# Patient Record
Sex: Male | Born: 1965
Health system: Southern US, Community
[De-identification: ages and names within clinical notes are randomized; demographics above are authoritative.]

## PROBLEM LIST (undated history)

## (undated) ENCOUNTER — Emergency Department (HOSPITAL_COMMUNITY): Discharge status: Against Medical Advice | Payer: Medicaid Other

## (undated) ENCOUNTER — Ambulatory Visit (HOSPITAL_COMMUNITY): Disposition: A | Discharge status: Home / Self Care | Payer: Medicaid Other

## (undated) DIAGNOSIS — I10 Essential (primary) hypertension: Secondary | ICD-10-CM

## (undated) DIAGNOSIS — J449 Chronic obstructive pulmonary disease, unspecified: Secondary | ICD-10-CM

## (undated) DIAGNOSIS — J45909 Unspecified asthma, uncomplicated: Secondary | ICD-10-CM

## (undated) DIAGNOSIS — Z5900 Homelessness unspecified: Secondary | ICD-10-CM

## (undated) DIAGNOSIS — I509 Heart failure, unspecified: Secondary | ICD-10-CM

## (undated) DIAGNOSIS — F259 Schizoaffective disorder, unspecified: Secondary | ICD-10-CM

## (undated) DIAGNOSIS — K219 Gastro-esophageal reflux disease without esophagitis: Secondary | ICD-10-CM

## (undated) DIAGNOSIS — E119 Type 2 diabetes mellitus without complications: Secondary | ICD-10-CM

## (undated) DIAGNOSIS — F329 Major depressive disorder, single episode, unspecified: Secondary | ICD-10-CM

## (undated) DIAGNOSIS — F32A Depression, unspecified: Secondary | ICD-10-CM

## (undated) DIAGNOSIS — F319 Bipolar disorder, unspecified: Secondary | ICD-10-CM

## (undated) DIAGNOSIS — R9431 Abnormal electrocardiogram [ECG] [EKG]: Secondary | ICD-10-CM

## (undated) DEATH — deceased

---

## 2011-12-01 ENCOUNTER — Encounter: Payer: Self-pay | Admitting: *Deleted

## 2011-12-01 ENCOUNTER — Emergency Department (HOSPITAL_COMMUNITY)
Admission: EM | Admit: 2011-12-01 | Discharge: 2011-12-12 | Disposition: A | Discharge status: Home / Self Care | Payer: Medicaid Other | Attending: Emergency Medicine | Admitting: Emergency Medicine

## 2011-12-01 DIAGNOSIS — I10 Essential (primary) hypertension: Secondary | ICD-10-CM | POA: Insufficient documentation

## 2011-12-01 DIAGNOSIS — R443 Hallucinations, unspecified: Secondary | ICD-10-CM | POA: Insufficient documentation

## 2011-12-01 DIAGNOSIS — F17200 Nicotine dependence, unspecified, uncomplicated: Secondary | ICD-10-CM | POA: Insufficient documentation

## 2011-12-01 DIAGNOSIS — R45851 Suicidal ideations: Secondary | ICD-10-CM

## 2011-12-01 HISTORY — DX: Essential (primary) hypertension: I10

## 2011-12-01 HISTORY — DX: Depression, unspecified: F32.A

## 2011-12-01 HISTORY — DX: Major depressive disorder, single episode, unspecified: F32.9

## 2011-12-01 LAB — COMPREHENSIVE METABOLIC PANEL
ALT: 31 U/L (ref 0–53)
AST: 49 U/L — ABNORMAL HIGH (ref 0–37)
Albumin: 4.7 g/dL (ref 3.5–5.2)
CO2: 25 mEq/L (ref 19–32)
Chloride: 96 mEq/L (ref 96–112)
GFR calc non Af Amer: >90 mL/min (ref >90)
Sodium: 137 mEq/L (ref 135–145)
Total Bilirubin: 0.5 mg/dL (ref 0.3–1.2)

## 2011-12-01 LAB — CBC
Platelets: 264 10*3/uL (ref 150–400)
RBC: 4.37 MIL/uL (ref 4.22–5.81)
WBC: 12.8 10*3/uL — ABNORMAL HIGH (ref 4.0–10.5)

## 2011-12-01 NOTE — ED Notes (Signed)
Pt c/o SI w/o plan, same complaint x 2 prior. Pt is presently voluntary. Pt presents w/ some evidence of distress, no eye contact, covering face. Pt denies HI and SI plans. Pt admits to auditory hallucinations.

## 2011-12-02 DIAGNOSIS — R45851 Suicidal ideations: Secondary | ICD-10-CM

## 2011-12-02 LAB — RAPID URINE DRUG SCREEN, HOSP PERFORMED
Amphetamines: NOT DETECTED
Barbiturates: NOT DETECTED
Cocaine: POSITIVE — AB
Tetrahydrocannabinol: NOT DETECTED

## 2011-12-02 MED ORDER — LORAZEPAM 1 MG PO TABS
1.0000 mg | ORAL_TABLET | Freq: Three times a day (TID) | ORAL | Status: DC | PRN
  Administered 2011-12-10: 1 mg via ORAL
  Filled 2011-12-02 (×2): qty 1

## 2011-12-02 MED ORDER — ONDANSETRON HCL 4 MG PO TABS
4.0000 mg | ORAL_TABLET | Freq: Three times a day (TID) | ORAL | Status: DC | PRN
  Filled 2011-12-02: qty 1

## 2011-12-02 MED ORDER — RISPERIDONE 1 MG PO TABS
1.0000 mg | ORAL_TABLET | Freq: Two times a day (BID) | ORAL | Status: DC
  Administered 2011-12-02 (×2): 1 mg via ORAL
  Filled 2011-12-02 (×2): qty 1

## 2011-12-02 MED ORDER — ALUM & MAG HYDROXIDE-SIMETH 200-200-20 MG/5ML PO SUSP: 30.0000 mL | ORAL | Status: DC | PRN

## 2011-12-02 MED ORDER — NICOTINE 21 MG/24HR TD PT24
21.0000 mg | MEDICATED_PATCH | Freq: Every day | TRANSDERMAL | Status: DC
  Filled 2011-12-02 (×2): qty 1

## 2011-12-02 MED ORDER — IBUPROFEN 600 MG PO TABS
600.0000 mg | ORAL_TABLET | Freq: Three times a day (TID) | ORAL | Status: DC | PRN
  Administered 2011-12-02 – 2011-12-10 (×2): 600 mg via ORAL
  Filled 2011-12-02 (×2): qty 1

## 2011-12-02 NOTE — BH Assessment (Signed)
Assessment Note   [name] is a 46 y.o. male who presents voluntarily at [facility] with SI but not plan. Pt's mood was very irritable and he refused to answer most questions. States he is "pissed as hell". Pt yelled at this clinician and said "I don't want to talk about it right now" several times. Most of info in this assessment is collateral info from PA-C and RN. Pt reports auditory hallucinations with several voices and "voices keep changing". Pt reports hx of depression which is currently untreated. States he is supposed to be on antipsychotic meds but doesn't know what he should be taking as his meds change frequently.   Pt states he had been in inpatient treatment at [facility] several times but was unsure of a specific dx. Pt reports he tried to overdose twice by ingesting pills.   Axis I: Psychotic Disorder NOS Axis II: Deferred Axis III:  Past Medical History  Diagnosis Date  . Hypertension   . Depression    Axis IV: Unknown Axis V: 21-30 behavior considerably influenced by delusions or hallucinations OR serious impairment in judgment, communication OR inability to function in almost all areas  Past Medical History:  Past Medical History  Diagnosis Date  . Hypertension   . Depression     History reviewed. No pertinent past surgical history.  Family History: History reviewed. No pertinent family history.  Social History:  reports that he has been smoking Cigarettes.  He has been smoking about 1 pack per day. He does not have any smokeless tobacco history on file. He reports that he drinks alcohol. He reports that he does not use illicit drugs.  Additional Social History:    Allergies: No Known Allergies  Home Medications:  Medications Prior to Admission  Medication Dose Route Frequency Provider Last Rate Last Dose  . alum & mag hydroxide-simeth (MAALOX/MYLANTA) 200-200-20 MG/5ML suspension 30 mL  30 mL Oral PRN Carolee Rota, PA      . ibuprofen (ADVIL,MOTRIN) tablet 600  mg  600 mg Oral Q8H PRN Carolee Rota, PA      . LORazepam (ATIVAN) tablet 1 mg  1 mg Oral Q8H PRN Carolee Rota, PA      . nicotine (NICODERM CQ - dosed in mg/24 hours) patch 21 mg  21 mg Transdermal Daily Carolee Rota, [place]      . ondansetron [facility]) tablet 4 mg  4 mg Oral Q8H PRN Carolee Rota, PA       No current outpatient prescriptions on file as of 12/01/2011.    OB/GYN Status:  No LMP for male patient.  General Assessment Data [facility] Living Arrangements:  [facility]) Can pt return to current living arrangement?: Yes Admission Status: Voluntary Transfer from: Home Referral Source: Self/Family/Friend     Risk to self Suicidal Ideation: Yes-Currently Present Suicidal Intent: Yes-Currently Present Is patient at risk for suicide?: Yes Depression: Yes Substance abuse history and/or treatment for substance abuse?: No  Risk to Others Homicidal Ideation: No Thoughts of Harm to Others: No Current Homicidal Intent: No  Psychosis Hallucinations: Auditory ("whole bunch of voices" )  Mental Status Report Appear/Hygiene: Disheveled;Poor hygiene Eye Contact: Poor Motor Activity: Freedom of movement Speech: Argumentative;Loud Level of Consciousness: Irritable Affect: Irritable;Angry     Prior Inpatient Therapy Prior Inpatient Therapy: Yes Prior Therapy Dates: has been to [facility] several times Prior Therapy Facilty/Provider(s): [facility] - wouldn't give dates  Values / Beliefs Cultural Requests During Hospitalization: None Spiritual Requests During Hospitalization: None        Additional Information Does patient have medical clearance?: Yes     Disposition:  Disposition Disposition of Patient: Inpatient treatment program Type of inpatient treatment program: Adult   On Site Evaluation by:   Reviewed with Physician:     Donnamarie Rossetti P 12/02/2011 4:56 AM

## 2011-12-02 NOTE — ED Notes (Signed)
Dr alhuwalia into see 

## 2011-12-02 NOTE — ED Provider Notes (Signed)
Medical screening examination/treatment/procedure(s) were performed by non-physician practitioner and as supervising physician I was immediately available for consultation/collaboration.   Danyla Wattley W Kemonie Cutillo, MD 12/02/11 0756 

## 2011-12-02 NOTE — ED Provider Notes (Signed)
History     CSN: [id number]  Arrival date & time 12/01/11  2200   First MD Initiated Contact with Patient 12/01/11 2309      Chief Complaint  Patient presents with  . Medical Clearance  . Suicidal    (Consider location/radiation/quality/duration/timing/severity/associated sxs/prior treatment) HPI Comments: Patient presents with complaint of suicidal ideation. He has a history of depression that is currently untreated. He is supposed to be on antipsychotic medications, but he says they change so frequently he doesn't know what he supposed to be taking. Patient denies any current chronic health problems or complaints. Patient admits to auditory hallucinations which are not new. Patient states he cannot make out what the voices are saying.  Patient is a 46 y.o. male presenting with mental health disorder. The history is provided by the patient.  Mental Health Problem The primary symptoms include hallucinations.  Additional symptoms of the illness do not include no headaches or no abdominal pain. He admits to suicidal ideas. He does not have a plan to commit suicide.    Past Medical History  Diagnosis Date  . Hypertension   . Depression     History reviewed. No pertinent past surgical history.  History reviewed. No pertinent family history.  History  Substance Use Topics  . Smoking status: Current Everyday Smoker -- 1.0 packs/day    Types: Cigarettes  . Smokeless tobacco: Not on file  . Alcohol Use: Yes     infrequently      Review of Systems  Constitutional: Negative for fever and chills.  HENT: Negative for sore throat and rhinorrhea.   Eyes: Negative for redness.  Respiratory: Negative for shortness of breath.   Cardiovascular: Negative for chest pain.  Gastrointestinal: Negative for nausea, vomiting, abdominal pain, diarrhea and constipation.  Genitourinary: Negative for dysuria.  Musculoskeletal: Negative for myalgias.  Skin: Negative for rash.  Neurological:  Negative for dizziness and headaches.  Psychiatric/Behavioral: Positive for hallucinations.    Allergies  Review of patient's allergies indicates no known allergies.  Home Medications  No current outpatient prescriptions on file.  BP 156/94  Pulse 90  Temp(Src) 98.7 F (37.1 C) (Oral)  Resp 20  SpO2 100%  Physical Exam  Nursing note and vitals reviewed. Constitutional: He is oriented to person, place, and time. He appears well-developed and well-nourished.  HENT:  Head: Normocephalic and atraumatic.  Eyes: Conjunctivae are normal. Pupils are equal, round, and reactive to light. Right eye exhibits no discharge. Left eye exhibits no discharge.  Neck: Normal range of motion. Neck supple.  Cardiovascular: Normal rate, regular rhythm and normal heart sounds.   Pulmonary/Chest: Effort normal and breath sounds normal.  Abdominal: Soft. Bowel sounds are normal. There is no tenderness. There is no rebound and no guarding.  Musculoskeletal: He exhibits no edema.  Neurological: He is alert and oriented to person, place, and time.  Skin: Skin is warm and dry.    ED Course  Procedures (including critical care time)  Labs Reviewed  CBC - Abnormal; Notable for the following:    WBC 12.8 (*)    RDW 15.7 (*)    All other components within normal limits  COMPREHENSIVE METABOLIC PANEL - Abnormal; Notable for the following:    Total Protein 8.7 (*)    AST 49 (*)    All other components within normal limits  ETHANOL - Abnormal; Notable for the following:    Alcohol, Ethyl (B) 28 (*)    All other components within normal limits  URINE  RAPID DRUG SCREEN (HOSP PERFORMED) - Abnormal; Notable for the following:    Cocaine POSITIVE (*)    All other components within normal limits   No results found.   1. Suicidal ideation     12:29 AM Patient was seen and examined. Workup is pending.  2:48 AM ACT to see.  MDM  SI        Carolee Rota, [place] 12/02/11 [phone]

## 2011-12-02 NOTE — ED Notes (Signed)
Patient being transferred to Psych ED rm# 41. Plan of care discussed with patient. Verbal report given to [place], Charity fundraiser.

## 2011-12-02 NOTE — ED Provider Notes (Signed)
Patient resting comfortably with no complaints.  Nelia Shi, MD 12/02/11 2228

## 2011-12-02 NOTE — ED Notes (Signed)
Care assumed, pt lying in bed sleeping

## 2011-12-02 NOTE — Consult Note (Signed)
Patient Identification:  [name] Date of Evaluation:  12/02/2011   History of Present Illness:  [name] is a 46 y.o. male who presents voluntarily at [facility] with SI but not plan. Pt's mood was very irritable and he refused to answer most questions. States he is "pissed as hell". Pt yelled at this clinician and said "I don't want to talk about it right now" several times. Most of info in this assessment is collateral info from PA-C and RN. Pt reports auditory hallucinations with several voices and "voices keep changing". Pt reports hx of depression which is currently untreated. States he is supposed to be on antipsychotic meds but doesn't know what he should be taking as his meds change frequently.  Pt states he had been in inpatient treatment at [facility] several times but was unsure of a specific dx. Pt reports he tried to overdose twice by ingesting pills.   Patient agrees to be started on Risperdal and he don't want to go to behavioral health but [facility]. Patient is logical and goal-directed during the interview.  Past Medical History:     Past Medical History  Diagnosis Date  . Hypertension   . Depression       History reviewed. No pertinent past surgical history.  Filed Vitals:   12/02/11 1417  BP:   Pulse:   Temp: 98.6 F (37 C)  Resp:     Lab Results:   BMET    Component Value Date/Time   NA 137 12/01/2011 2257   K 3.9 12/01/2011 2257   CL 96 12/01/2011 2257   CO2 25 12/01/2011 2257   GLUCOSE 82 12/01/2011 2257   BUN 14 12/01/2011 2257   CREATININE 0.83 12/01/2011 2257   CALCIUM 10.1 12/01/2011 2257   GFRNONAA >90 12/01/2011 2257   GFRAA >90 12/01/2011 2257    Allergies: No Known Allergies  Current Medications:  Prior to Admission medications   Not on File    Social History:    reports that he has been smoking Cigarettes.  He has been smoking about 1 pack per day. He does not have any smokeless tobacco history on file.  He reports that he drinks alcohol. He reports that he does not use illicit drugs.   Family History:    History reviewed. No pertinent family history.   DIAGNOSIS:   AXIS I  psychoses NOS   AXIS II  Deffered  AXIS III See medical notes.  AXIS IV  chronic mental health issues   AXIS V 40     Recommendations:  Patient need inpatient stabilization, started on Risperdal.   Eulogio Ditch, MD

## 2011-12-02 NOTE — ED Notes (Signed)
Awake and hungry - given a drink and boxed lunch an dreturns to sleep

## 2011-12-02 NOTE — ED Notes (Signed)
Patient brought to room 29 from triage.  Patient cooperative .  vacillating as to whether he wants to commit Sucide or hurt someone.  ACT team in to see the patient

## 2011-12-02 NOTE — ED Provider Notes (Signed)
Pt seen and evaluated in the psych ED.  He is having auditory hallucinations.  Has been assessed by ACT and is under review for placement at [facility].   Ethelda Chick, MD 12/02/11 [phone]

## 2011-12-02 NOTE — ED Notes (Signed)
Up to the bathroom 

## 2011-12-02 NOTE — ED Notes (Signed)
Pt c/o of being hot and sweating--room temp adjusted

## 2011-12-02 NOTE — BH Assessment (Signed)
[facility] referral needed, per Dr. Ahluwalia. On-coming staff made aware of patients disposition. Patient declined from in-pt admission to [facility], per Dr. Ahluwalia.  

## 2011-12-03 MED ORDER — RISPERIDONE 2 MG PO TABS
2.0000 mg | ORAL_TABLET | Freq: Two times a day (BID) | ORAL | Status: DC
  Filled 2011-12-03 (×6): qty 1

## 2011-12-03 NOTE — ED Provider Notes (Signed)
Vitals stable, sleeping currently.  Pt is psychotic, declined at [facility], awaiting possibly [facility] placement.    Gavin Pound. Oletta Lamas, MD 12/03/11 [phone]

## 2011-12-03 NOTE — ED Notes (Signed)
TC to [facility] and informed no beds per Beverly.  TC to [facility] and spoke with Robin, who stated they had no beds today.  TC to [facility] & informed no beds currently, but possibly after 1:00pm after discharges.  TC to [facility] & spoke with Michelle who stated they do have beds. TC to [facility] & spoke with Kathy who stated they have beds today.  

## 2011-12-03 NOTE — BH Assessment (Signed)
Assessment Note   [name] is an 46 y.o. male. This Clinical research associate met with pt to complete updated assessment. Pt reported to the [facility] voluntarily for SI with no plan and auditory hallucinations.Pt refused to answer the majority of assessment questions, becoming extremely agitated when asked updated assessment questions, stating " he doesn't need to answer the same questions everyday, nothing has changed." Pt added that the Risperdal does not work and that "he wants to go to [facility] not [facility]". Pt states that he "hears a whole bunch of voices all talking at the same time and cannot understand what is being said". Pt continues to endorse symptoms of depression and SI without a plan. Pt stated during the previous assessment that he has tried to OD twice, ingesting an unknown type of pill.   Per RN, pt has refused his medications this evening.  Pt has been accepted at [facility] pending a 400 hall bed, however pt is now requesting to transfer to [facility].  Axis I: Psychotic Disorder NOS Axis II: Deferred Axis III:  Past Medical History  Diagnosis Date  . Hypertension   . Depression    Axis IV: other psychosocial or environmental problems Axis V: 21-30 behavior considerably influenced by delusions or hallucinations OR serious impairment in judgment, communication OR inability to function in almost all areas  Past Medical History:  Past Medical History  Diagnosis Date  . Hypertension   . Depression     History reviewed. No pertinent past surgical history.  Family History: History reviewed. No pertinent family history.  Social History:  reports that he has been smoking Cigarettes.  He has been smoking about 1 pack per day. He does not have any smokeless tobacco history on file. He reports that he drinks alcohol. He reports that he does not use illicit drugs.  Additional Social History:    Allergies: No Known Allergies  Home Medications:  Medications Prior to Admission  Medication Dose Route Frequency  Provider Last Rate Last Dose  . alum & mag hydroxide-simeth (MAALOX/MYLANTA) 200-200-20 MG/5ML suspension 30 mL  30 mL Oral PRN Carolee Rota, PA      . ibuprofen (ADVIL,MOTRIN) tablet 600 mg  600 mg Oral Q8H PRN Carolee Rota, PA   600 mg at 12/02/11 1207  . LORazepam (ATIVAN) tablet 1 mg  1 mg Oral Q8H PRN Carolee Rota, PA      . nicotine (NICODERM CQ - dosed in mg/24 hours) patch 21 mg  21 mg Transdermal Daily Carolee Rota, PA      . ondansetron (ZOFRAN) tablet 4 mg  4 mg Oral Q8H PRN Carolee Rota, PA      . risperiDONE (RISPERDAL) tablet 2 mg  2 mg Oral BID Katheren Puller, MD      . DISCONTD: risperiDONE (RISPERDAL) tablet 1 mg  1 mg Oral BID Katheren Puller, MD   1 mg at 12/02/11 2130   No current outpatient prescriptions on file as of 12/01/2011.    OB/GYN Status:  No LMP for male patient.  General Assessment Data [facility] Living Arrangements: Other (Comment) ([facility]) Can pt return to current living arrangement?: Yes Admission Status: Voluntary Is patient capable of signing voluntary admission?: Yes Transfer from: Acute Hospital Referral Source: Self/Family/Friend  Education Status Is patient currently in school?: No  Risk to self Suicidal Ideation: Yes-Currently Present Suicidal Intent: Yes-Currently Present Is patient at risk for suicide?: Yes Suicidal Plan?: No-Not Currently/Within Last 6 Months  Recent stressful life event(s): Other (Comment) (persistent mental health diagnosis) Persecutory voices/beliefs?: No Depression: Yes Depression Symptoms: Feeling angry/irritable;Loss of interest in usual pleasures Substance abuse history and/or treatment for substance abuse?: No Suicide prevention information given to non-admitted patients: Not applicable  Risk to Others Homicidal Ideation: No Thoughts of Harm to Others: No Current Homicidal Intent: No Current Homicidal Plan: No  Psychosis Hallucinations: Auditory ("whole  bunch of voices at one time") Delusions: None noted  Mental Status Report Appear/Hygiene: Disheveled Eye Contact: Poor Motor Activity: Agitation Speech: Aggressive;Argumentative;Loud Level of Consciousness: Irritable Mood: Angry Affect: Angry;Irritable Anxiety Level: None Thought Processes: Coherent Judgement: Impaired Orientation: Person;Place;Time;Situation Obsessive Compulsive Thoughts/Behaviors: None  Cognitive Functioning Concentration: Decreased Memory: Recent Intact;Remote Intact IQ: Average Insight: Poor Impulse Control: Poor  Prior Inpatient Therapy Prior Inpatient Therapy: Yes Prior Therapy Dates: has been to [facility] several times Prior Therapy Facilty/Provider(s): [facility] - wouldn't give dates Reason for Treatment: AH               Values / Beliefs Cultural Requests During Hospitalization: None Spiritual Requests During Hospitalization: None        Additional Information 1:1 In Past 12 Months?: No CIRT Risk: Yes Elopement Risk: No Does patient have medical clearance?: Yes     Disposition:  Disposition Disposition of Patient: Inpatient treatment program Type of inpatient treatment program: Adult  On Site Evaluation by:   Reviewed with Physician:     N[place]ada Crane F 12/03/2011 10:45 PM

## 2011-12-03 NOTE — ED Notes (Signed)
SW in to speak with pt.

## 2011-12-03 NOTE — Consult Note (Signed)
  [name] is a 46 y.o. male [id number] 1966/03/21  Subjective/Objective:  Patient is still irritable and told me "everything is messed up" on repeatedly asking patient is not willing to share his issues. He still reported hearing voices, but patient does not seem to be internally preoccupied or delusional. He is redirectable during the interview. Patient is now aggreable to go to behavioral health for further stabilization.   Filed Vitals:   12/03/11 0613  BP: 109/81  Pulse: 66  Temp: 97.9 F (36.6 C)  Resp: 14    Lab Results:   BMET    Component Value Date/Time   NA 137 12/01/2011 2257   K 3.9 12/01/2011 2257   CL 96 12/01/2011 2257   CO2 25 12/01/2011 2257   GLUCOSE 82 12/01/2011 2257   BUN 14 12/01/2011 2257   CREATININE 0.83 12/01/2011 2257   CALCIUM 10.1 12/01/2011 2257   GFRNONAA >90 12/01/2011 2257   GFRAA >90 12/01/2011 2257    Medications:  Scheduled:     . nicotine  21 mg Transdermal Daily  . risperiDONE  1 mg Oral BID     PRN Meds alum & mag hydroxide-simeth, ibuprofen, LORazepam, ondansetron  Assessment/Plan:  Increased risperdal to 4 mg per day.  AHLUWALIA,SHAMSHER S MD 12/03/2011

## 2011-12-03 NOTE — ED Notes (Signed)
Care assumed, pt in room lying in bed watching television, affect brighter today than yesterday, less guarded, pleasant demeanor, states he took a shower today and that made him feel better. Continuing to monitor/assess behavior.

## 2011-12-03 NOTE — ED Notes (Addendum)
Writer in to administer HS risperdal 2mg  to pt, pt refused stating "I'm not taking that, the shit does not work", "I'm still hearing voices and the MD does not know what he is doing, I need something stronger like 1200 mg of seroquel which I am used to taking, that MD does not know he is not the one hearing voices". "I need a real MD, get a real MD in here to see me", pt got extremely agitated about the medication, writer advised pt that will place note in chart for MD and that he should also address his medication concerns to the MD when he does his rounds. Pt denied AH on writer's first assessment now he is c/o AH, does not disclose what the voices are saying.

## 2011-12-04 NOTE — ED Notes (Signed)
Care assumed, pt up OOB to use bathroom

## 2011-12-04 NOTE — ED Notes (Signed)
Spoke with Merlyn Albert at [facility] who reports who reports will call ACT team once patient has been reviewed.

## 2011-12-04 NOTE — ED Notes (Signed)
Belongings moved to locker 41 in [place]

## 2011-12-04 NOTE — ED Notes (Signed)
[facility] has no available bed for patient presently.  Patient is on the waiting list at [facility] and IVC is in place.

## 2011-12-04 NOTE — ED Notes (Signed)
Went into pt's room to take vitals. Patient stated "This is the last time you're gonna take my blood pressure if ya'll don't start giving me my blood pressure medicines." I asked if he took them on a daily basis and pt stated "you the nurse you know what I'm supposed to take." Explained to pt that I was the tech and that I was trying to understand the situation. I told pt what his bp was and asked if that was around normal for him on his bp meds. Pt stated "how do I know what's normal? I'm not the doctor." I explained I was only trying to help and get his baseline if he new what it was. As I was closing the door pt then called this tech a "dumbass". I explained to patient I was only here to help him then pt stated "if you gonna get an attitude then so am I". I told pt I was sorry he felt that way and pt stated "yeah me too now get out of my room."

## 2011-12-04 NOTE — ED Notes (Signed)
Pt continues to refuse HS medications

## 2011-12-04 NOTE — ED Notes (Signed)
Pt became agitated when I asked him about possibly taking blood pressure medicine.  Stated he took it while he was at [facility] but would not answer any other questions and stated "yall keep asking me the same fucking questions and I'm not answering anything else".  Stated his risperdal was not working and he was not going to take it.

## 2011-12-04 NOTE — ED Notes (Addendum)
Writer in room lying in bed guarded, angry affect, angry demeanor, writer attempting to engage pt in conversation, pt hesitant at first but as conversation continued, pt seemed to open up more, Primary school teacher what kind of report did she receive about him today, pt informed that Clinical research associate was told that he was agitated/refusing to answer questions/take medication. Pt states he has cursed out two people today and he is tired of people asking him the same questions over and over, he does not want to take the medication because it does not work, pt feels the MD/nurses here are not trying to help him/meet his needs and that when they come in his room they are coming in just to annoy him. Pt states he does not bother anybody and he is in his room minding his business. Pt states the only place he can get help is at [facility] and that is where he wants to go. Pt agitated during the conversation, says the MD/RN's/other staff are making him feel like he is stupid by asking him the same thing over/over, pt attempts to staff split, suspicious about certain staff, exhibiting paranoid behaviors and defensive at times, reports he is hearing voices however does not disclose what the voices are saying. States he will take his medications tonight and attempt to be more cooperative with the other staff. Continuing to monitor/assess behavior.

## 2011-12-04 NOTE — ED Notes (Signed)
Asked patient if he would like a shower. Patient stated "I took one yesterday." Asked patient if he would like one today and patient stated "I'll let you know later."

## 2011-12-05 MED ORDER — CLONIDINE HCL 0.1 MG PO TABS
0.1000 mg | ORAL_TABLET | Freq: Three times a day (TID) | ORAL | Status: DC
  Administered 2011-12-05 – 2011-12-11 (×17): 0.1 mg via ORAL
  Filled 2011-12-05 (×19): qty 1

## 2011-12-05 NOTE — ED Notes (Signed)
Patient refused vitals he said last night was the last time anyone will get vitals cause no one is treating his blood pressure he [facility] he will be treated right

## 2011-12-05 NOTE — ED Notes (Signed)
Pt requested to see charge nurse.  I asked him if there was anything that I could do for him and he stated "no". Jenaya notified.

## 2011-12-05 NOTE — ED Provider Notes (Addendum)
3:36 PM The patient is currently refusing all medications until he gets a dose of clonidine which he says he takes for blood pressure control.  After reviewing his vital signs, I find it safe and appropriate to order this medication and have done so.  Felisa Bonier, MD 12/05/11 1539  8:13 AM The patient was seen and evaluated by me this morning and is in no apparent distress, is awake and alert, and is aware of the current treatment plan.  The patient makes no requests for any interventions or treatments otherwise at this time.   Felisa Bonier, MD 12/11/11 [phone]
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
## 2011-12-05 NOTE — ED Provider Notes (Signed)
  Physical Exam  BP 154/98  Pulse 71  Temp(Src) 97.9 F (36.6 C) (Oral)  Resp 18  SpO2 99%  Physical Exam  ED Course  Procedures  MDM Resting comfortably, no complaints, vitals stable.     Glynn Octave, MD 12/05/11 [phone]

## 2011-12-05 NOTE — ED Notes (Signed)
Spoke with pt per request. Pt requesting sandwich and BP medication. BP medication ordered. Pt resting comfortably, refusing to cooperate with [facility] staff, coopereative with Consulting civil engineer.

## 2011-12-05 NOTE — ED Notes (Signed)
Patient refused to allow vital signs to be taken stating "It ain't doing no good. Don't worry about it. Everything will be taken care of."

## 2011-12-06 NOTE — Progress Notes (Signed)
Pt is on Wait List at [facility]) and will be transported once bed available.  Process usually takes 3 to 7 days so pt most likely will be in ED through weekend and Monday.  ACT will continue to follow with [facility].  Ms. [name] at [facility] verified pt is still on wait list.

## 2011-12-06 NOTE — ED Provider Notes (Signed)
Pt stable awaiting placement  [name] Lennert, MD 12/06/11 [phone]
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
## 2011-12-08 NOTE — BH Assessment (Signed)
Contacted [facility] and spoke to [place] to inquire about in-pt availability. She stated that they do not have any beds available at this time and to call back at 1pm regarding possible discharges at their facility.

## 2011-12-08 NOTE — BH Assessment (Signed)
Received a call from [place] at [facility] stating that patient is on the wait list.

## 2011-12-08 NOTE — ED Provider Notes (Signed)
Stable VS, resting comfortably. Awaiting placement  Loren Racer, MD 12/08/11 1105

## 2011-12-08 NOTE — BH Assessment (Signed)
Assessment Note   Contacted [facility] and spoke to [place]. She stated that their facility has planned discharges today. Writer faxed patients information to their facility @ [phone].

## 2011-12-09 NOTE — ED Provider Notes (Signed)
Patient resting on AM recheck.  He denies new complaints, and inquires about placement.  He was informed that we are awaiting placement.  [name] Munch, MD 12/09/11 1032

## 2011-12-10 NOTE — ED Notes (Signed)
Patient stated to me while I was sitting with another patient that he would "like to kill the bald headed guy in the glasses." Which he was speaking about his nurse Kathlene November.

## 2011-12-10 NOTE — ED Notes (Signed)
Offered patient shower. Refusing at this time.

## 2011-12-10 NOTE — ED Notes (Signed)
TC with Sheryl @ [facility]. Stated pt is still on wait list and is next to be called (pending discharges for bed openings). Will need legal documents sent. They will call when bed opens.

## 2011-12-10 NOTE — ED Notes (Signed)
Pt lying on his bed, hollering out, I am hungry, I want food". Ate his lunch tray earlier. Instructed to not shout out as it could disturb the other pts.

## 2011-12-11 NOTE — ED Notes (Signed)
Pt in room watching television, observed by staff smearing something on the camera so he could not be observed, Clinical research associate in room with security officer/[employer] requesting pt to clean camera, pt becomes agitated, loud, beligerant, and refuses to clean the camera while everyone is present, pt lying in bed with no shirt on continuously refuses to get up and clean camera.

## 2011-12-11 NOTE — ED Notes (Signed)
Patient up at nursing station asking for clean scrubs. When asked if he was ready to shower so this tech could clean his room and change linen patient stated "no, I'll let you know when I'm ready."

## 2011-12-11 NOTE — ED Notes (Signed)
Care assumed

## 2011-12-12 NOTE — ED Notes (Signed)
Tele psych consult completed, pt initially refused to speak with MD, stating he is not going to talk to MD a 2 am, pt informed this is the only time Md can speak with him, pt agitated, refusing to answer some of the questions, vague, guarded, back turned to camera, cursing at times, Tele Psych MD recommendation is to d/c pt.

## 2011-12-12 NOTE — ED Provider Notes (Signed)
4:03 AM Telepsychiatrist deems patient dischargable.   Hanley Seamen, MD 12/12/11 0403

## 2011-12-12 NOTE — ED Notes (Signed)
Pt up OOB to desk requesting to speak with Charge Nurse, Charge Nurse Victorino Dike B called states she will come speak with pt when she is available. Pt loud, beligerent, refusing to go inside room, standing inside room doorway, disruptive to milieu, cursing, disrespectful and refusing to cooperative.

## 2016-05-06 DIAGNOSIS — T391X1A Poisoning by 4-Aminophenol derivatives, accidental (unintentional), initial encounter: Secondary | ICD-10-CM | POA: Insufficient documentation

## 2016-05-08 DIAGNOSIS — T1491XA Suicide attempt, initial encounter: Secondary | ICD-10-CM | POA: Insufficient documentation

## 2016-05-29 ENCOUNTER — Encounter (HOSPITAL_COMMUNITY): Payer: Self-pay

## 2016-05-29 ENCOUNTER — Emergency Department (HOSPITAL_COMMUNITY)
Admission: EM | Admit: 2016-05-29 | Discharge: 2016-05-30 | Disposition: A | Discharge status: Other Acute Inpatient Hospital | Payer: Medicaid Other | Attending: Emergency Medicine | Admitting: Emergency Medicine

## 2016-05-29 DIAGNOSIS — I10 Essential (primary) hypertension: Secondary | ICD-10-CM | POA: Insufficient documentation

## 2016-05-29 DIAGNOSIS — R45851 Suicidal ideations: Secondary | ICD-10-CM | POA: Diagnosis present

## 2016-05-29 DIAGNOSIS — F17210 Nicotine dependence, cigarettes, uncomplicated: Secondary | ICD-10-CM | POA: Insufficient documentation

## 2016-05-29 DIAGNOSIS — J45909 Unspecified asthma, uncomplicated: Secondary | ICD-10-CM | POA: Diagnosis not present

## 2016-05-29 DIAGNOSIS — R440 Auditory hallucinations: Secondary | ICD-10-CM | POA: Insufficient documentation

## 2016-05-29 HISTORY — DX: Unspecified asthma, uncomplicated: J45.909

## 2016-05-29 LAB — COMPREHENSIVE METABOLIC PANEL
ALK PHOS: 62 U/L (ref 38–126)
ALT: 29 U/L (ref 17–63)
AST: 42 U/L — AB (ref 15–41)
Albumin: 4 g/dL (ref 3.5–5.0)
Anion gap: 8 (ref 5–15)
BUN: 16 mg/dL (ref 6–20)
CALCIUM: 9.3 mg/dL (ref 8.9–10.3)
CO2: 26 mmol/L (ref 22–32)
CREATININE: 1.17 mg/dL (ref 0.61–1.24)
Chloride: 107 mmol/L (ref 101–111)
Glucose, Bld: 147 mg/dL — ABNORMAL HIGH (ref 65–99)
Potassium: 4 mmol/L (ref 3.5–5.1)
SODIUM: 141 mmol/L (ref 135–145)
Total Bilirubin: 0.8 mg/dL (ref 0.3–1.2)
Total Protein: 7.2 g/dL (ref 6.5–8.1)

## 2016-05-29 LAB — RAPID URINE DRUG SCREEN, HOSP PERFORMED
AMPHETAMINES: NOT DETECTED
BARBITURATES: NOT DETECTED
Benzodiazepines: NOT DETECTED
Cocaine: POSITIVE — AB
OPIATES: NOT DETECTED
TETRAHYDROCANNABINOL: NOT DETECTED

## 2016-05-29 LAB — ACETAMINOPHEN LEVEL: Acetaminophen (Tylenol), Serum: <10 ug/mL — ABNORMAL LOW (ref 10–30)

## 2016-05-29 LAB — CBC
HCT: 42.1 % (ref 39.0–52.0)
HEMOGLOBIN: 14.3 g/dL (ref 13.0–17.0)
MCH: 33.1 pg (ref 26.0–34.0)
MCHC: 34 g/dL (ref 30.0–36.0)
MCV: 97.5 fL (ref 78.0–100.0)
Platelets: 236 10*3/uL (ref 150–400)
RBC: 4.32 MIL/uL (ref 4.22–5.81)
RDW: 13.8 % (ref 11.5–15.5)
WBC: 8.5 10*3/uL (ref 4.0–10.5)

## 2016-05-29 LAB — ETHANOL: Alcohol, Ethyl (B): 7 mg/dL — ABNORMAL HIGH (ref <5)

## 2016-05-29 LAB — SALICYLATE LEVEL

## 2016-05-29 MED ORDER — IBUPROFEN 800 MG PO TABS
800.0000 mg | ORAL_TABLET | Freq: Once | ORAL | Status: AC
  Administered 2016-05-29: 800 mg via ORAL
  Filled 2016-05-29: qty 1

## 2016-05-29 MED ORDER — HYDROCERIN EX CREA
TOPICAL_CREAM | Freq: Two times a day (BID) | CUTANEOUS | Status: DC
  Filled 2016-05-29: qty 113

## 2016-05-29 MED ORDER — ALBUTEROL SULFATE HFA 108 (90 BASE) MCG/ACT IN AERS
2.0000 | INHALATION_SPRAY | Freq: Once | RESPIRATORY_TRACT | Status: AC
  Administered 2016-05-29: 2 via RESPIRATORY_TRACT
  Filled 2016-05-29: qty 6.7

## 2016-05-29 NOTE — ED Notes (Signed)
With TTS 

## 2016-05-29 NOTE — ED Provider Notes (Signed)
CSN: [id number]     Arrival date & time 05/29/16  1314 History   First MD Initiated Contact with Patient 05/29/16 1614     Chief Complaint  Patient presents with  . Psychiatric Evaluation     (Consider location/radiation/quality/duration/timing/severity/associated sxs/prior Treatment) Patient is a 50 y.o. male presenting with mental health disorder. The history is provided by the patient.  Mental Health Problem Presenting symptoms: hallucinations, suicidal thoughts and suicidal threats   Presenting symptoms: no suicide attempt   Degree of incapacity (severity):  Moderate Onset quality:  Gradual Duration:  2 days Timing:  Constant Progression:  Worsening Chronicity:  Recurrent Context: drug abuse and noncompliance   Treatment compliance:  Untreated Time since last dose of psychoactive medication: "its been a while" Relieved by:  Nothing Worsened by:  Nothing tried Ineffective treatments:  None tried Associated symptoms: anxiety   Associated symptoms: no abdominal pain, no chest pain and no headaches    50 yo M With a chief complaint of suicidal ideation. Patient states he's been hearing voices and they've been telling him to jump in front of traffic. He recently try to kill himself at another facility that he would not name by overdosing on Tylenol. He states that they were not trying to help him there so he moved here to seek someone that could help him. He has not taken his antipsychotics in quite some time. He says this is because the voices told not to.  Past Medical History  Diagnosis Date  . Hypertension   . Depression   . Asthma    History reviewed. No pertinent past surgical history. No family history on file. Social History  Substance Use Topics  . Smoking status: Current Every Day Smoker -- 1.00 packs/day    Types: Cigarettes  . Smokeless tobacco: None  . Alcohol Use: Yes     Comment: infrequently    Review of Systems  Constitutional: Negative for fever and  chills.  HENT: Negative for congestion and facial swelling.   Eyes: Negative for discharge and visual disturbance.  Respiratory: Negative for shortness of breath.   Cardiovascular: Negative for chest pain and palpitations.  Gastrointestinal: Negative for vomiting, abdominal pain and diarrhea.  Musculoskeletal: Negative for myalgias and arthralgias.  Skin: Negative for color change and rash.  Neurological: Negative for tremors, syncope and headaches.  Psychiatric/Behavioral: Positive for suicidal ideas, hallucinations and dysphoric mood. Negative for confusion. The patient is nervous/anxious.       Allergies  Tylenol  Home Medications   Prior to Admission medications   Not on File   BP 126/84 mmHg  Pulse 99  Temp(Src) 98.2 F (36.8 C) (Oral)  Resp 22  Ht  (1.753 m)  Wt 180 lb 4 oz (81.761 kg)  BMI 26.61 kg/m2  SpO2 99% Physical Exam  Constitutional: He is oriented to person, place, and time. He appears well-developed and well-nourished.  HENT:  Head: Normocephalic and atraumatic.  Eyes: EOM are normal. Pupils are equal, round, and reactive to light.  Neck: Normal range of motion. Neck supple. No JVD present.  Cardiovascular: Normal rate and regular rhythm.  Exam reveals no gallop and no friction rub.   No murmur heard. Pulmonary/Chest: No respiratory distress. He has no wheezes.  Abdominal: He exhibits no distension. There is no rebound and no guarding.  Musculoskeletal: Normal range of motion.  Neurological: He is alert and oriented to person, place, and time.  Skin: No rash noted. No pallor.  Psychiatric: He has a normal  mood and affect. His behavior is normal.  Nursing note and vitals reviewed.   ED Course  Procedures (including critical care time) Labs Review Labs Reviewed  COMPREHENSIVE METABOLIC PANEL - Abnormal; Notable for the following:    Glucose, Bld 147 (*)    AST 42 (*)    All other components within normal limits  ETHANOL - Abnormal; Notable  for the following:    Alcohol, Ethyl (B) 7 (*)    All other components within normal limits  ACETAMINOPHEN LEVEL - Abnormal; Notable for the following:    Acetaminophen (Tylenol), Serum <10 (*)    All other components within normal limits  URINE RAPID DRUG SCREEN, HOSP PERFORMED - Abnormal; Notable for the following:    Cocaine POSITIVE (*)    All other components within normal limits  SALICYLATE LEVEL  CBC    Imaging Review No results found. I have personally reviewed and evaluated these images and lab results as part of my medical decision-making.   EKG Interpretation None      MDM   Final diagnoses:  Suicidal ideation    50 yo M with suicidal ideation. TTS evaluation.  Psych will admit.   The patients results and plan were reviewed and discussed.   Any x-rays performed were independently reviewed by myself.   Differential diagnosis were considered with the presenting HPI.  Medications  hydrocerin (EUCERIN) cream (not administered)  ibuprofen (ADVIL,MOTRIN) tablet 800 mg (800 mg Oral Given 05/29/16 1716)  albuterol (PROVENTIL HFA;VENTOLIN HFA) 108 (90 Base) MCG/ACT inhaler 2 puff (2 puffs Inhalation Given 05/29/16 1716)    Filed Vitals:   05/29/16 1322  BP: 126/84  Pulse: 99  Temp: 98.2 F (36.8 C)  TempSrc: Oral  Resp: 22  Height: 5\' 9"  (1.753 m)  Weight: 180 lb 4 oz (81.761 kg)  SpO2: 99%    Final diagnoses:  Suicidal ideation      Melene Plan, DO 05/29/16 2210
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
## 2016-05-29 NOTE — ED Notes (Signed)
Called for [employer].

## 2016-05-29 NOTE — ED Notes (Signed)
Snack given.

## 2016-05-29 NOTE — ED Notes (Signed)
Per PT, Pt reports having hx of psychiatric treatment and has not been complaint with medication. Pt reports hearing voices that are telling him to kill himself. No specific plan to harm himself noted.

## 2016-05-29 NOTE — ED Notes (Signed)
Meal tray ordered 

## 2016-05-30 ENCOUNTER — Inpatient Hospital Stay (HOSPITAL_COMMUNITY)
Admission: AD | Admit: 2016-05-30 | Discharge: 2016-06-05 | DRG: 885 | Disposition: A | Discharge status: Home / Self Care | Payer: Medicaid Other | Source: Intra-hospital | Attending: Psychiatry | Admitting: Psychiatry

## 2016-05-30 ENCOUNTER — Encounter (HOSPITAL_COMMUNITY): Payer: Self-pay

## 2016-05-30 DIAGNOSIS — G43909 Migraine, unspecified, not intractable, without status migrainosus: Secondary | ICD-10-CM | POA: Diagnosis present

## 2016-05-30 DIAGNOSIS — F419 Anxiety disorder, unspecified: Secondary | ICD-10-CM | POA: Diagnosis present

## 2016-05-30 DIAGNOSIS — B370 Candidal stomatitis: Secondary | ICD-10-CM | POA: Diagnosis present

## 2016-05-30 DIAGNOSIS — I10 Essential (primary) hypertension: Secondary | ICD-10-CM | POA: Diagnosis present

## 2016-05-30 DIAGNOSIS — F339 Major depressive disorder, recurrent, unspecified: Secondary | ICD-10-CM | POA: Diagnosis present

## 2016-05-30 DIAGNOSIS — J45909 Unspecified asthma, uncomplicated: Secondary | ICD-10-CM | POA: Diagnosis present

## 2016-05-30 DIAGNOSIS — F1420 Cocaine dependence, uncomplicated: Secondary | ICD-10-CM | POA: Diagnosis present

## 2016-05-30 DIAGNOSIS — F17210 Nicotine dependence, cigarettes, uncomplicated: Secondary | ICD-10-CM | POA: Diagnosis present

## 2016-05-30 DIAGNOSIS — R440 Auditory hallucinations: Secondary | ICD-10-CM | POA: Diagnosis not present

## 2016-05-30 DIAGNOSIS — F333 Major depressive disorder, recurrent, severe with psychotic symptoms: Secondary | ICD-10-CM | POA: Diagnosis present

## 2016-05-30 DIAGNOSIS — F250 Schizoaffective disorder, bipolar type: Secondary | ICD-10-CM | POA: Diagnosis not present

## 2016-05-30 DIAGNOSIS — F1010 Alcohol abuse, uncomplicated: Secondary | ICD-10-CM | POA: Diagnosis not present

## 2016-05-30 DIAGNOSIS — Z9114 Patient's other noncompliance with medication regimen: Secondary | ICD-10-CM | POA: Diagnosis not present

## 2016-05-30 DIAGNOSIS — R45851 Suicidal ideations: Secondary | ICD-10-CM | POA: Diagnosis not present

## 2016-05-30 DIAGNOSIS — G4700 Insomnia, unspecified: Secondary | ICD-10-CM | POA: Diagnosis present

## 2016-05-30 MED ORDER — IBUPROFEN 400 MG PO TABS
400.0000 mg | ORAL_TABLET | Freq: Four times a day (QID) | ORAL | Status: DC | PRN
  Administered 2016-05-30 – 2016-06-03 (×2): 400 mg via ORAL
  Filled 2016-05-30 (×2): qty 1

## 2016-05-30 MED ORDER — ALBUTEROL SULFATE HFA 108 (90 BASE) MCG/ACT IN AERS
2.0000 | INHALATION_SPRAY | RESPIRATORY_TRACT | Status: DC | PRN
  Administered 2016-05-30 – 2016-06-03 (×2): 2 via RESPIRATORY_TRACT
  Filled 2016-05-30 (×2): qty 6.7

## 2016-05-30 MED ORDER — LISINOPRIL 20 MG PO TABS
20.0000 mg | ORAL_TABLET | Freq: Every day | ORAL | Status: DC
  Administered 2016-05-30: 20 mg via ORAL
  Filled 2016-05-30: qty 1

## 2016-05-30 MED ORDER — AEROCHAMBER PLUS W/MASK MISC
1.0000 | Freq: Once | Status: AC
  Administered 2016-05-30: 1
  Filled 2016-05-30: qty 1

## 2016-05-30 MED ORDER — HYDROCERIN EX CREA
TOPICAL_CREAM | Freq: Two times a day (BID) | CUTANEOUS | Status: DC
  Administered 2016-06-01 – 2016-06-02 (×3): via TOPICAL
  Filled 2016-05-30 (×2): qty 113

## 2016-05-30 MED ORDER — ALUM & MAG HYDROXIDE-SIMETH 200-200-20 MG/5ML PO SUSP: 30.0000 mL | ORAL | Status: DC | PRN

## 2016-05-30 MED ORDER — TRAZODONE HCL 50 MG PO TABS: 50.0000 mg | ORAL_TABLET | Freq: Every evening | ORAL | Status: DC | PRN

## 2016-05-30 MED ORDER — MAGNESIUM HYDROXIDE 400 MG/5ML PO SUSP: 30.0000 mL | Freq: Every day | ORAL | Status: DC | PRN

## 2016-05-30 MED ORDER — ALBUTEROL SULFATE (2.5 MG/3ML) 0.083% IN NEBU: 2.0000 mL | INHALATION_SOLUTION | RESPIRATORY_TRACT | Status: DC | PRN

## 2016-05-30 MED ORDER — LISINOPRIL 20 MG PO TABS
20.0000 mg | ORAL_TABLET | Freq: Every day | ORAL | Status: DC
  Administered 2016-05-31 – 2016-06-05 (×6): 20 mg via ORAL
  Filled 2016-05-30 (×8): qty 1

## 2016-05-30 MED ORDER — ALBUTEROL SULFATE HFA 108 (90 BASE) MCG/ACT IN AERS
2.0000 | INHALATION_SPRAY | RESPIRATORY_TRACT | Status: DC | PRN
Start: 2016-05-30 — End: 2016-05-30

## 2016-05-30 MED ORDER — OLANZAPINE 5 MG PO TBDP: 5.0000 mg | ORAL_TABLET | Freq: Two times a day (BID) | ORAL | Status: DC | PRN

## 2016-05-30 NOTE — BHH Counselor (Signed)
Referral faxed to following as beds are available: [facility]  No beds available: [facility]  Evette Cristal, [place] Therapeutic Triage Specialist

## 2016-05-30 NOTE — ED Notes (Signed)
MD at bedside. 

## 2016-05-30 NOTE — ED Notes (Signed)
EMTALA checked by Irving Burton b, RN

## 2016-05-30 NOTE — Tx Team (Signed)
Initial Interdisciplinary Treatment Plan   PATIENT STRESSORS: Health problems Medication change or noncompliance Substance abuse   PATIENT STRENGTHS: General fund of knowledge   PROBLEM LIST: Problem List/Patient Goals Date to be addressed Date deferred Reason deferred Estimated date of resolution  Depression 05/30/16     Suicidal ideation 05/30/16     Psychosis 05/30/16     He refused to give a goal.  "I'm done talking" 05/30/16                                    DISCHARGE CRITERIA:  Improved stabilization in mood, thinking, and/or behavior Verbal commitment to aftercare and medication compliance  PRELIMINARY DISCHARGE PLAN: Outpatient therapy Medication management  PATIENT/FAMIILY INVOLVEMENT: This treatment plan has been presented to and reviewed with the patient, [name].  The patient and family have been given the opportunity to ask questions and make suggestions.  Norm Parcel [name] 05/30/2016, 4:18 PM

## 2016-05-30 NOTE — Progress Notes (Signed)
Psychoeducational Group Note  Date:  05/30/2016 Time:  2144  Group Topic/Focus:  Wrap-Up Group:   The focus of this group is to help patients review their daily goal of treatment and discuss progress on daily workbooks.  Participation Level: Did Not Attend  Participation Quality:  Not Applicable  Affect:  Not Applicable  Cognitive:  Not Applicable  Insight:  Not Applicable  Engagement in Group: Not Applicable  Additional Comments:  The patient did not attend group.   Hazle Coca S 05/30/2016, 9:44 PM

## 2016-05-30 NOTE — ED Notes (Signed)
[facility] called stating that patient has  A bed (503-2) at [facility] and [name] is the accepting physician.

## 2016-05-30 NOTE — ED Notes (Signed)
Belongings collected by night shift RN prior to this RN taking over care of patient. Belongings given to [facility] transport upon transport to [facility]

## 2016-05-30 NOTE — ED Notes (Signed)
[facility] here ready for transport.

## 2016-05-30 NOTE — ED Notes (Signed)
Regular breakfast tray ordered.  

## 2016-05-30 NOTE — Progress Notes (Signed)
[name] is a 50 y.o. male being admitted voluntarily to 76-[facility] from [facility].  He came in reporting symptoms of depression and suicidal ideation with a plan to jump off a building. Pt has a history of depression, and says he is hearing voices that tell him to harm himself, and sometimes they tell him to "slap" other people, but he has never acted on thoughts to harm others.  He has history of hurting his own arm by biting and bruising. He has been off his medication for "several months".  Past attempts include overdosing on Tylenol a month ago and being admitted to the hospital but will not say where. He states he "just moved here", and says he is staying with a friend. He is reporting symptoms including crying spells, social withdrawal, loss of interest in usual pleasures, decreased concentration, fatigue, irritability, decreased sleep, decreased appetite and feelings of hopelessness. PT denies homicidal ideation or history of violence. Pt denies auditory or visual hallucinations or other psychotic symptoms. Pt denies alcohol or substance abuse.  He is diagnosed with Substance abuse disorder, Major Depressive Disorder, chronic, severe with psychotic features.  He was very agitated during the assessment.  He refused to answer questions or sign paperwork.  "I am done with answering questions."  Admission paperwork completed and signed.  Belongings searched and secured in locker # 30.  Skin assessment completed and no skin issues noted.  Q 15 minute checks initiated for safety.  We will monitor the progress towards his goals.

## 2016-05-30 NOTE — BHH Counselor (Signed)
Writer notified pt's RN Leeroy Bock that pt has been accepted to [facility] bed [phone] and can be transported at any time now. Attending MD will be Eappen. # for report is [phone].  Evette Cristal, [place] Therapeutic Triage Specialist

## 2016-05-30 NOTE — ED Notes (Signed)
[place] transportation contacted for transport to [facility]

## 2016-05-30 NOTE — ED Provider Notes (Addendum)
Patient offers no complaint. He is requesting his daily medications, to include lisinopril 20 g daily and albuterol inhaler as needed for dyspnea. Ordered by me. Patient is alert awake and in no distress  Doug Sou, MD 05/30/16 0754 2:40 PM patient alert ambulatory pleasant and cooperative. Stable for transfer to [facility] accepting physician Dr.Eappen Results for orders placed or performed during the hospital encounter of 05/29/16  Comprehensive metabolic panel  Result Value Ref Range   Sodium 141 135 - 145 mmol/L   Potassium 4.0 3.5 - 5.1 mmol/L   Chloride 107 101 - 111 mmol/L   CO2 26 22 - 32 mmol/L   Glucose, Bld 147 (H) 65 - 99 mg/dL   BUN 16 6 - 20 mg/dL   Creatinine, Ser 0.26 0.61 - 1.24 mg/dL   Calcium 9.3 8.9 - 37.8 mg/dL   Total Protein 7.2 6.5 - 8.1 g/dL   Albumin 4.0 3.5 - 5.0 g/dL   AST 42 (H) 15 - 41 U/L   ALT 29 17 - 63 U/L   Alkaline Phosphatase 62 38 - 126 U/L   Total Bilirubin 0.8 0.3 - 1.2 mg/dL   GFR calc non Af Amer >60 >60 mL/min   GFR calc Af Amer >60 >60 mL/min   Anion gap 8 5 - 15  Ethanol  Result Value Ref Range   Alcohol, Ethyl (B) 7 (H) <5 mg/dL  Salicylate level  Result Value Ref Range   Salicylate Lvl <4.0 2.8 - 30.0 mg/dL  Acetaminophen level  Result Value Ref Range   Acetaminophen (Tylenol), Serum <10 (L) 10 - 30 ug/mL  cbc  Result Value Ref Range   WBC 8.5 4.0 - 10.5 K/uL   RBC 4.32 4.22 - 5.81 MIL/uL   Hemoglobin 14.3 13.0 - 17.0 g/dL   HCT 58.8 50.2 - 77.4 %   MCV 97.5 78.0 - 100.0 fL   MCH 33.1 26.0 - 34.0 pg   MCHC 34.0 30.0 - 36.0 g/dL   RDW 12.8 78.6 - 76.7 %   Platelets 236 150 - 400 K/uL  Rapid urine drug screen (hospital performed)  Result Value Ref Range   Opiates NONE DETECTED NONE DETECTED   Cocaine POSITIVE (A) NONE DETECTED   Benzodiazepines NONE DETECTED NONE DETECTED   Amphetamines NONE DETECTED NONE DETECTED   Tetrahydrocannabinol NONE DETECTED NONE DETECTED   Barbiturates NONE DETECTED NONE DETECTED   No results  found.   Doug Sou, MD 05/30/16 1446  Doug Sou, MD 05/30/16 1450

## 2016-05-31 ENCOUNTER — Encounter (HOSPITAL_COMMUNITY): Payer: Self-pay | Admitting: Psychiatry

## 2016-05-31 DIAGNOSIS — F250 Schizoaffective disorder, bipolar type: Secondary | ICD-10-CM | POA: Diagnosis present

## 2016-05-31 DIAGNOSIS — I10 Essential (primary) hypertension: Secondary | ICD-10-CM | POA: Diagnosis present

## 2016-05-31 DIAGNOSIS — R45851 Suicidal ideations: Secondary | ICD-10-CM

## 2016-05-31 DIAGNOSIS — F1010 Alcohol abuse, uncomplicated: Secondary | ICD-10-CM | POA: Diagnosis present

## 2016-05-31 DIAGNOSIS — F1420 Cocaine dependence, uncomplicated: Secondary | ICD-10-CM

## 2016-05-31 MED ORDER — MAGIC MOUTHWASH
5.0000 mL | Freq: Four times a day (QID) | ORAL | Status: DC
  Administered 2016-05-31 – 2016-06-04 (×9): 5 mL via ORAL
  Filled 2016-05-31 (×30): qty 5

## 2016-05-31 MED ORDER — OLANZAPINE 10 MG IM SOLR: 5.0000 mg | Freq: Three times a day (TID) | INTRAMUSCULAR | Status: DC | PRN

## 2016-05-31 MED ORDER — LORAZEPAM 2 MG/ML IJ SOLN: 1.0000 mg | Freq: Four times a day (QID) | INTRAMUSCULAR | Status: DC | PRN

## 2016-05-31 MED ORDER — ARIPIPRAZOLE 5 MG PO TABS
5.0000 mg | ORAL_TABLET | Freq: Every day | ORAL | Status: DC
  Administered 2016-05-31: 5 mg via ORAL
  Filled 2016-05-31 (×2): qty 1

## 2016-05-31 MED ORDER — LORAZEPAM 1 MG PO TABS
1.0000 mg | ORAL_TABLET | Freq: Four times a day (QID) | ORAL | Status: DC | PRN
  Filled 2016-05-31: qty 1

## 2016-05-31 MED ORDER — LAMOTRIGINE 25 MG PO TABS
25.0000 mg | ORAL_TABLET | Freq: Every evening | ORAL | Status: DC
  Administered 2016-06-01 – 2016-06-03 (×3): 25 mg via ORAL
  Filled 2016-05-31 (×6): qty 1

## 2016-05-31 MED ORDER — OLANZAPINE 5 MG PO TBDP: 5.0000 mg | ORAL_TABLET | Freq: Three times a day (TID) | ORAL | Status: DC | PRN

## 2016-05-31 NOTE — BHH Suicide Risk Assessment (Signed)
[facility] Admission Suicide Risk Assessment   Nursing information obtained from:  Patient Demographic factors:  Male, Low socioeconomic status Current Mental Status:  Suicidal ideation indicated by patient Loss Factors:  NA (Pt refused to answer questions) Historical Factors:  Prior suicide attempts Risk Reduction Factors:  NA (Pt refused to answer questions)  Total Time spent with patient: 30 minutes Principal Problem: Schizoaffective disorder, bipolar type (HCC) Diagnosis:   Patient Active Problem List   Diagnosis Date Noted  . Schizoaffective disorder, bipolar type (HCC) [F25.0] 05/31/2016  . Cocaine use disorder, severe, dependence (HCC) [F14.20] 05/31/2016  . Alcohol use disorder, mild, abuse [F10.10] 05/31/2016   Subjective Data: Please see H&P.   Continued Clinical Symptoms:  Alcohol Use Disorder Identification Test Final Score (AUDIT): 0 The "Alcohol Use Disorders Identification Test", Guidelines for Use in Primary Care, Second Edition.  World Science writer [facility]). Score between 0-7:  no or low risk or alcohol related problems. Score between 8-15:  moderate risk of alcohol related problems. Score between 16-19:  high risk of alcohol related problems. Score 20 or above:  warrants further diagnostic evaluation for alcohol dependence and treatment.   CLINICAL FACTORS:   Alcohol/Substance Abuse/Dependencies Previous Psychiatric Diagnoses and Treatments   Musculoskeletal: Strength & Muscle Tone: within normal limits Gait & Station: normal Patient leans: N/A  Psychiatric Specialty Exam: Physical Exam  Nursing note and vitals reviewed.   Review of Systems  Psychiatric/Behavioral: Positive for depression, suicidal ideas, hallucinations and substance abuse. The patient is nervous/anxious.   All other systems reviewed and are negative.   Blood pressure 171/96, pulse 57, temperature 97.7 F (36.5 C), temperature source Oral, resp. rate 18, height 5\' 9"  (1.753 m), weight  81.647 kg (180 lb), SpO2 100 %.Body mass index is 26.57 kg/(m^2).                      Please see H&P.                                     COGNITIVE FEATURES THAT CONTRIBUTE TO RISK:  Closed-mindedness, Polarized thinking and Thought constriction (tunnel vision)    SUICIDE RISK:   Severe:  Frequent, intense, and enduring suicidal ideation, specific plan, no subjective intent, but some objective markers of intent (i.e., choice of lethal method), the method is accessible, some limited preparatory behavior, evidence of impaired self-control, severe dysphoria/symptomatology, multiple risk factors present, and few if any protective factors, particularly a lack of social support.  PLAN OF CARE: Please see H&P.   I certify that inpatient services furnished can reasonably be expected to improve the patient's condition.   Khadir Roam, MD 05/31/2016, 9:15 AM

## 2016-05-31 NOTE — Progress Notes (Signed)
D: Pt endorsed severe depression and severe anxiety; was observed pacing the hall for several mins at a time. Pt denies AVH; Pt was however observed talking to self while pacing the hall. Pt may also be paranoid; unhappy that roommate came in and out of room several times. Pt could be hostile and easily agitated; got angry when nursed asked if he need something for anxiety, "why do you all keep asking me that; I would let you know if I need something; okay?" Pt denies SI, HI and pain. A: PRN medications offered as prescribed.  Support, encouragement, and safe environment provided.  15-minute safety checks continue. R: Pt was refused all PRNs offered.  Pt did not attend group. Safety checks continue.

## 2016-05-31 NOTE — BHH Group Notes (Signed)
BHH Group Notes:  (Nursing/MHT/Case Management/Adjunct)  Date:  05/31/2016  Time:  11:04 AM  Type of Therapy:  Nurse Education  Participation Level:  Did Not Attend   Mickie Bail 05/31/2016, 11:04 AM

## 2016-05-31 NOTE — Progress Notes (Signed)
[name] has been  walking out in the 500 hall most of the day. He is anxious / agitated. He talks / mumbles / constantly..sometimes his conversation is understandable and other times it is not. A He refused to complete his daily assessment. HE refused to allow an  EKG to be performed on him. He refused to take lamictal but has taken his  Lisinopril and magic mouthwash. R Safety in place. Staff to cont to attempt to gain patient's trust.

## 2016-05-31 NOTE — Progress Notes (Signed)
[name] is seen OOB UAL on the 500 hall today tolerated fair. HE is somewhat disorganized and agitated as he walks around the unit this morning. A He refused to complete his daily assessment when this nurse asked him to answer the questions walked down the hall...mutterting to himself. R Safety in place. MD ordered prn magic mouthwash ( for pt c/o mouth sores and irritation) and pt started on po lamictal and abilify Safety in place.

## 2016-05-31 NOTE — H&P (Signed)
Psychiatric Admission Assessment Adult  Patient Identification: [name] MRN:  [id number] Date of Evaluation:  05/31/2016 Chief Complaint:  Patient states " I need help .'     Principal Diagnosis: Schizoaffective disorder, bipolar type ([place]) Diagnosis:   Patient Active Problem List   Diagnosis Date Noted  . Schizoaffective disorder, bipolar type ([place]) [F25.0] 05/31/2016  . Cocaine use disorder, severe, dependence ([place]) [F14.20] 05/31/2016  . Alcohol use disorder, mild, abuse [F10.10] 05/31/2016  . Essential hypertension [I10] 05/31/2016   History of Present Illness: [name] is a 50 y.o. AA male , who is single , on SSD, who presented reporting symptoms of depression and suicidal ideation with a plan to jump off a building.   Per initial notes in EHR " Pt has a history of depression, and says he is hearing voices that tell him to harm himself, and sometimes they tell him to "slap" other people, but he has never acted on thoughts to harm others. He has hurt his own arm by biting and bruising. Pt reports that he has been off his medication for "several months". Past attempts include overdosing on Tylenol a month ago and being admitted to the hospital (pt refuses to say where). He states he "just moved here", and says he is staying with a friend, but won't say where he came from, and his chart says he was seen here in 2012.Pt acknowledges symptoms including crying spells, social withdrawal, loss of interest in usual pleasures, decreased concentration, fatigue, irritability, decreased sleep, decreased appetite and feelings of hopelessness. PT denies homicidal ideation or history of violence. Pt denies auditory or visual hallucinations or other psychotic symptoms. Pt denies alcohol or substance abuse.Pt denies current stressors. Pt admits to history of abuse and trauma, but does not disclose much. Pt has fair insight and judgement. Pt's OP history includes some treatment, but pt  refuses to disclose any information about past treatment.Pt is casually dressed, alert, oriented x4 with normal speech and normal motor behavior. Eye contact is good. Pt's mood is irritable/depressed and affect is depressed. Affect is congruent with mood. Thought process is coherent and relevant. There is no indication Pt is currently responding to internal stimuli or experiencing delusional thought content. Pt was cooperative throughout assessment. Pt is currently unable to contract for safety outside the hospital and wants inpatient psychiatric treatment."    Patient seen and chart reviewed TODAY .Discussed patient with treatment team. Pt appeared as very angry and irritable , with mood lability . Pt was seen as getting mad and loud several times during the evaluation. Pt required a lot of redirection. Pt reported several stressors , not being on the right medications , having a drug addiction and getting help with it . Pt reports he has tried seroquel in the past which made him very groggy. Pt reports AH - asking him to kill self. Pt confirmed above hx - see above - has depressive sx as well as has SI with plan. Pt reports he abuses cocaine daily - several grams - unable to give exact quantity - has been abusing it for a very long time. Pt also abuses alcohol on a daily basis- but reports he does not get drunk. Pt reports several hospitalizations , more than he is able to count and has had several suicide attempts , mostly by OD . His recent OD was a month ago , on several tylenol pills and required emergency care.  Associated Signs/Symptoms: Depression Symptoms:  depressed mood, anhedonia, insomnia, psychomotor agitation,  psychomotor retardation, fatigue, feelings of worthlessness/guilt, difficulty concentrating, hopelessness, suicidal thoughts with specific plan, suicidal attempt, anxiety, (Hypo) Manic Symptoms:  Distractibility, Hallucinations, Impulsivity, Irritable Mood, Labiality  of Mood, Anxiety Symptoms:  Excessive Worry, Psychotic Symptoms:  Hallucinations: Auditory Command:  see above PTSD Symptoms: Negative Total Time spent with patient: 1 hour  Past Psychiatric History:Reports hx of bipolar, schizophrenia and cocaine abuse. Pt reports several hospitalizations , more than he is able to count and has had several suicide attempts . Pt reports he is noncompliant on medications.  Is the patient at risk to self? Yes.    Has the patient been a risk to self in the past 6 months? Yes.    Has the patient been a risk to self within the distant past? Yes.    Is the patient a risk to others? Yes.    Has the patient been a risk to others in the past 6 months? Yes.    Has the patient been a risk to others within the distant past? No.   Prior Inpatient Therapy:  see above Prior Outpatient Therapy:  see above  Alcohol Screening: 1. How often do you have a drink containing alcohol?: Never 9. Have you or someone else been injured as a result of your drinking?: No 10. Has a relative or friend or a doctor or another health worker been concerned about your drinking or suggested you cut down?: No Alcohol Use Disorder Identification Test Final Score (AUDIT): 0 Brief Intervention: AUDIT score less than 7 or less-screening does not suggest unhealthy drinking-brief intervention not indicated (Pt stated, "don't you worry about it") Substance Abuse History in the last 12 months:  Yes.  see above- cocaine, alcohol Consequences of Substance Abuse: Medical Consequences:  several admissions in the past Previous Psychotropic Medications: Yes - risperidone , seroquel ( groggy) Psychological Evaluations: No  Past Medical History:  Past Medical History  Diagnosis Date  . Hypertension   . Depression   . Asthma    History reviewed. No pertinent past surgical history. Family History:  Family History  Problem Relation Age of Onset  . Drug abuse Mother   . Suicidality Mother    Family  Psychiatric  History: denies Tobacco Screening: 0.5 ppd - offered nicotine patch Social History: pt is single, homeless , has SSD, reports hx of legal issues, none pending. History  Alcohol Use  . Yes    Comment: infrequently     History  Drug Use  . Yes  . Special: Cocaine    Additional Social History:      Pain Medications: denies Prescriptions: denies Over the Counter: denies History of alcohol / drug use?: Yes Longest period of sobriety (when/how long): 6 1/2 years in 90's-2002 Negative Consequences of Use: Financial Withdrawal Symptoms: Other (Comment) (He refused to answer questions) Name of Substance 1: alcohol--liquor and beer 1 - Age of First Use: doesn't remember 1 - Amount (size/oz): variable 1 - Frequency: every few days 1 - Duration: years 1 - Last Use / Amount: wouldn't answer for admission nurse Name of Substance 2: cocaine (per [facility] assessment) 2 - Age of First Use: Unknown (He refused to answer questions) 2 - Amount (size/oz): variable 2 - Frequency: "every now and then" 2 - Duration: years 2 - Last Use / Amount: unknown (He refused to answer questions)                Allergies:   Allergies  Allergen Reactions  . Tylenol [Acetaminophen] Nausea And Vomiting  Lab Results:  Results for orders placed or performed during the hospital encounter of 05/29/16 (from the past 48 hour(s))  Rapid urine drug screen (hospital performed)     Status: Abnormal   Collection Time: 05/29/16  1:32 PM  Result Value Ref Range   Opiates NONE DETECTED NONE DETECTED   Cocaine POSITIVE (A) NONE DETECTED   Benzodiazepines NONE DETECTED NONE DETECTED   Amphetamines NONE DETECTED NONE DETECTED   Tetrahydrocannabinol NONE DETECTED NONE DETECTED   Barbiturates NONE DETECTED NONE DETECTED    Comment:        DRUG SCREEN FOR MEDICAL PURPOSES ONLY.  IF CONFIRMATION IS NEEDED FOR ANY PURPOSE, NOTIFY LAB WITHIN 5 DAYS.        LOWEST DETECTABLE LIMITS FOR URINE DRUG  SCREEN Drug Class       Cutoff (ng/mL) Amphetamine      1000 Barbiturate      200 Benzodiazepine   329 Tricyclics       924 Opiates          300 Cocaine          300 THC              50   Comprehensive metabolic panel     Status: Abnormal   Collection Time: 05/29/16  1:35 PM  Result Value Ref Range   Sodium 141 135 - 145 mmol/L   Potassium 4.0 3.5 - 5.1 mmol/L   Chloride 107 101 - 111 mmol/L   CO2 26 22 - 32 mmol/L   Glucose, Bld 147 (H) 65 - 99 mg/dL   BUN 16 6 - 20 mg/dL   Creatinine, Ser 1.17 0.61 - 1.24 mg/dL   Calcium 9.3 8.9 - 10.3 mg/dL   Total Protein 7.2 6.5 - 8.1 g/dL   Albumin 4.0 3.5 - 5.0 g/dL   AST 42 (H) 15 - 41 U/L   ALT 29 17 - 63 U/L   Alkaline Phosphatase 62 38 - 126 U/L   Total Bilirubin 0.8 0.3 - 1.2 mg/dL   GFR calc non Af Amer >60 >60 mL/min   GFR calc Af Amer >60 >60 mL/min    Comment: (NOTE) The eGFR has been calculated using the CKD EPI equation. This calculation has not been validated in all clinical situations. eGFR's persistently <60 mL/min signify possible Chronic Kidney Disease.    Anion gap 8 5 - 15  Ethanol     Status: Abnormal   Collection Time: 05/29/16  1:35 PM  Result Value Ref Range   Alcohol, Ethyl (B) 7 (H) <5 mg/dL    Comment:        LOWEST DETECTABLE LIMIT FOR SERUM ALCOHOL IS 5 mg/dL FOR MEDICAL PURPOSES ONLY   Salicylate level     Status: None   Collection Time: 05/29/16  1:35 PM  Result Value Ref Range   Salicylate Lvl <2.6 2.8 - 30.0 mg/dL  Acetaminophen level     Status: Abnormal   Collection Time: 05/29/16  1:35 PM  Result Value Ref Range   Acetaminophen (Tylenol), Serum <10 (L) 10 - 30 ug/mL    Comment:        THERAPEUTIC CONCENTRATIONS VARY SIGNIFICANTLY. A RANGE OF 10-30 ug/mL MAY BE AN EFFECTIVE CONCENTRATION FOR MANY PATIENTS. HOWEVER, SOME ARE BEST TREATED AT CONCENTRATIONS OUTSIDE THIS RANGE. ACETAMINOPHEN CONCENTRATIONS >150 ug/mL AT 4 HOURS AFTER INGESTION AND >50 ug/mL AT 12 HOURS AFTER INGESTION  ARE OFTEN ASSOCIATED WITH TOXIC REACTIONS.   cbc     Status:  None   Collection Time: 05/29/16  1:35 PM  Result Value Ref Range   WBC 8.5 4.0 - 10.5 K/uL   RBC 4.32 4.22 - 5.81 MIL/uL   Hemoglobin 14.3 13.0 - 17.0 g/dL   HCT 42.1 39.0 - 52.0 %   MCV 97.5 78.0 - 100.0 fL   MCH 33.1 26.0 - 34.0 pg   MCHC 34.0 30.0 - 36.0 g/dL   RDW 13.8 11.5 - 15.5 %   Platelets 236 150 - 400 K/uL    Blood Alcohol level:  Lab Results  Component Value Date   ETH 7* 05/29/2016   ETH 28* 19/14/7829    Metabolic Disorder Labs:  No results found for: HGBA1C, MPG No results found for: PROLACTIN No results found for: CHOL, TRIG, HDL, CHOLHDL, VLDL, LDLCALC  Current Medications: Current Facility-Administered Medications  Medication Dose Route Frequency Provider Last Rate Last Dose  . albuterol (PROVENTIL HFA;VENTOLIN HFA) 108 (90 Base) MCG/ACT inhaler 2 puff  2 puff Inhalation Q4H PRN Ursula Alert, MD   2 puff at 05/30/16 2002  . alum & mag hydroxide-simeth (MAALOX/MYLANTA) 200-200-20 MG/5ML suspension 30 mL  30 mL Oral Q4H PRN Niel Hummer, NP      . ARIPiprazole (ABILIFY) tablet 5 mg  5 mg Oral QHS Ursula Alert, MD      . hydrocerin (EUCERIN) cream   Topical BID Niel Hummer, NP      . ibuprofen (ADVIL,MOTRIN) tablet 400 mg  400 mg Oral Q6H PRN Niel Hummer, NP   400 mg at 05/30/16 2000  . lamoTRIgine (LAMICTAL) tablet 25 mg  25 mg Oral QPM Siara Gorder, MD      . lisinopril (PRINIVIL,ZESTRIL) tablet 20 mg  20 mg Oral Daily Niel Hummer, NP   20 mg at 05/31/16 0820  . LORazepam (ATIVAN) tablet 1 mg  1 mg Oral Q6H PRN Ursula Alert, MD       Or  . LORazepam (ATIVAN) injection 1 mg  1 mg Intramuscular Q6H PRN Ursula Alert, MD      . magic mouthwash  5 mL Oral QID Anner Baity, MD      . magnesium hydroxide (MILK OF MAGNESIA) suspension 30 mL  30 mL Oral Daily PRN Niel Hummer, NP      . OLANZapine zydis (ZYPREXA) disintegrating tablet 5 mg  5 mg Oral TID PRN Ursula Alert, MD        Or  . OLANZapine (ZYPREXA) injection 5 mg  5 mg Intramuscular TID PRN Ursula Alert, MD      . traZODone (DESYREL) tablet 50 mg  50 mg Oral QHS PRN Niel Hummer, NP       PTA Medications: No prescriptions prior to admission    Musculoskeletal: Strength & Muscle Tone: within normal limits Gait & Station: normal Patient leans: N/A  Psychiatric Specialty Exam: Physical Exam  Nursing note and vitals reviewed. Constitutional:  I concur with PE done in ED.    Review of Systems  Psychiatric/Behavioral: Positive for depression, suicidal ideas, hallucinations and substance abuse. The patient is nervous/anxious.   All other systems reviewed and are negative.   Blood pressure 171/96, pulse 57, temperature 97.7 F (36.5 C), temperature source Oral, resp. rate 18, height '5\' 9"'  (1.753 m), weight 81.647 kg (180 lb), SpO2 100 %.Body mass index is 26.57 kg/(m^2).  General Appearance: Guarded  Eye Contact:  Fair  Speech:  Normal Rate  Volume:  Increased  Mood:  Angry, Anxious and Irritable  Affect:  Labile  Thought Process:  Goal Directed and Descriptions of Associations: Circumstantial  Orientation:  Full (Time, Place, and Person)  Thought Content:  Hallucinations: Auditory Command:  kill self and Paranoid Ideation  Suicidal Thoughts:  Yes.  with intent/plan  Homicidal Thoughts:  No is irritable   Memory:  Immediate;   Fair Recent;   Fair Remote;   Fair  Judgement:  Impaired  Insight:  Shallow  Psychomotor Activity:  Restlessness  Concentration:  Concentration: Fair and Attention Span: Fair  Recall:  [employer] of Knowledge:  Fair  Language:  Fair  Akathisia:  No  Handed:  Right  AIMS (if indicated):     Assets:  Desire for Improvement  ADL's:  Intact  Cognition:  WNL  Sleep:  Number of Hours: 5.75       Treatment Plan Summary: [name] is a 50 y.o. AA male , who is single , on SSD, who presented reporting symptoms of depression and suicidal ideation with a plan to  jump off a building. Pt today is seen as irritable, angry , pacing the unit , being loud . Will start treatment - observe on the unit.  Daily contact with patient to assess and evaluate symptoms and progress in treatment and Medication management Patient will benefit from inpatient treatment and stabilization.  Estimated length of stay is 5-7 days.  Reviewed past medical records,treatment plan.  Will start Abilify 5 mg po qhs for psychosis/mood sx. Plan to DC pt on Abilify Maintena IM 400 mg - if he tolerates the po. Will add Lamictal 25 mg po daily for mood sx. Will restart home medications. Will add Magic mouth wash for oral sore. Will add PRN medications as per agitation protocol. Will continue to monitor vitals ,medication compliance and treatment side effects while patient is here.  Will monitor for medical issues as well as call consult as needed.  Reviewed labs cbc - wnl, bAL- 7 , uds - cocaine pos, CMP - AST elevated likely due to substance abuse ,will order EKG , tsh, lipid panel, hba1c, pl. CSW will start working on disposition.  Patient to participate in therapeutic milieu .        Observation Level/Precautions:  15 minute checks    Psychotherapy:  Individual and group therapy     Consultations:  Social worker  Discharge Concerns:stability and safety         I certify that inpatient services furnished can reasonably be expected to improve the patient's condition.    Kashina Mecum, MD 7/1/201711:02 AM

## 2016-05-31 NOTE — [facility] Group Notes (Signed)
[facility] Group Notes:  (Nursing/MHT/Case Management/Adjunct)  Date:  05/31/2016  Time:  11:08 AM  Type of Therapy:  Nurse Education  Participation Level:  Did Not Attend   Samamtha Tiegs O Iwenekha 05/31/2016, 11:08 AM 

## 2016-05-31 NOTE — Progress Notes (Signed)
Patient ID: [name], male   DOB: 02-02-1966, 50 y.o.   MRN: [id number] D: client visible on the unit, announces to writer "things started to pick up a little bit" "I always have depression" client pleasant with initial introduction, but can easily get agitated, after receiving medication and going to bed client begins to complain that staff was coming in to do safetery checks sooner than fifteen minutes. Client gets irritated gets up arguing, loudly, doesn't want staff to come in for checks. Paces the hall, requests to speak to charge nurse.  A: Administrator, arts. Olin Pia Charge RN spoke with client. Client eventually went back to bed after pace for about an hour. Staff will maintain safety checks q50min as explained by charge nurse, it is protocol. R: Client is safe on unit.

## 2016-05-31 NOTE — BH Assessment (Signed)
CSW attempted assessment with patient. Patient was initially resistant but once it was clarified that he was meeting with the social worker and that another Child psychotherapist will follow up with him on Monday, he was willing to continue. Patient answered 2 questions from the assessment, as this clinician began to ask additional questions, "Patient stated, you don't need to ask me about that. I'm not talking about that." Clinician attempted to explain the assessment and the information therein, patient stated, "I only need you to ask about 1 thing and I'm not answering anything else." This clinician attempted to clarify with patient those areas and the information the assessment was requesting and patient continued over-talking the clinician in order to prevent response. Patient then left the room refusing to complete the assessment.   Beverly Sessions MSW, [name]

## 2016-05-31 NOTE — [facility] Group Notes (Signed)
[facility] [name] Group Therapy  05/31/2016  11:20 - 11:55 AM  Type of Therapy:  Group Therapy  Participation Level:  Did Not Attend; although both MHT and CSW encouraged pt to do so and group was announced over speaker system.    Harrill, Catherine Campbell 

## 2016-06-01 MED ORDER — ARIPIPRAZOLE 15 MG PO TABS
7.5000 mg | ORAL_TABLET | Freq: Every day | ORAL | Status: DC
  Administered 2016-06-01 – 2016-06-02 (×2): 7.5 mg via ORAL
  Filled 2016-06-01 (×4): qty 1

## 2016-06-01 NOTE — Progress Notes (Signed)
[facility] MD Progress Note  06/01/2016 9:34 AM [name]  MRN:  [id number] Subjective: Patient states " I am a bit better.'  Objective:[name] is a 50 y.o. AA male , who is single , on SSD, who presented reporting symptoms of depression and suicidal ideation with a plan to jump off a building.  Patient seen and chart reviewed.Discussed patient with treatment team.  Patient today continues to have on and off mood sx as well as anger issues. He seems to be pressured , restless often and continues to require redirection on the unit. Pt denies any ADRs of medications. Will continue to encourage and support.     Principal Problem: Schizoaffective disorder, bipolar type (HCC) Diagnosis:   Patient Active Problem List   Diagnosis Date Noted  . Schizoaffective disorder, bipolar type (HCC) [F25.0] 05/31/2016  . Cocaine use disorder, severe, dependence (HCC) [F14.20] 05/31/2016  . Alcohol use disorder, mild, abuse [F10.10] 05/31/2016  . Essential hypertension [I10] 05/31/2016   Total Time spent with patient: 25 minutes  Past Psychiatric History: Please see H&P.   Past Medical History:  Past Medical History  Diagnosis Date  . Hypertension   . Depression   . Asthma    History reviewed. No pertinent past surgical history. Family History:  Family History  Problem Relation Age of Onset  . Drug abuse Mother   . Suicidality Mother    Family Psychiatric  History: Please see H&P.  Social History: Please see H&P.  History  Alcohol Use  . Yes    Comment: infrequently     History  Drug Use  . Yes  . Special: Cocaine    Social History   Social History  . Marital Status: Single    Spouse Name: N/A  . Number of Children: N/A  . Years of Education: N/A   Social History Main Topics  . Smoking status: Current Every Day Smoker -- 1.00 packs/day    Types: Cigarettes  . Smokeless tobacco: None  . Alcohol Use: Yes     Comment: infrequently  . Drug Use: Yes    Special:  Cocaine  . Sexual Activity: Not Asked   Other Topics Concern  . None   Social History Narrative   Additional Social History:    Pain Medications: denies Prescriptions: denies Over the Counter: denies History of alcohol / drug use?: Yes Longest period of sobriety (when/how long): 6 1/2 years in 90's-2002 Negative Consequences of Use: Financial Withdrawal Symptoms: Other (Comment) (He refused to answer questions) Name of Substance 1: alcohol--liquor and beer 1 - Age of First Use: doesn't remember 1 - Amount (size/oz): variable 1 - Frequency: every few days 1 - Duration: years 1 - Last Use / Amount: wouldn't answer for admission nurse Name of Substance 2: cocaine (per [employer] assessment) 2 - Age of First Use: Unknown (He refused to answer questions) 2 - Amount (size/oz): variable 2 - Frequency: "every now and then" 2 - Duration: years 2 - Last Use / Amount: unknown (He refused to answer questions)                Sleep: Poor  Appetite:  Fair  Current Medications: Current Facility-Administered Medications  Medication Dose Route Frequency Provider Last Rate Last Dose  . albuterol (PROVENTIL HFA;VENTOLIN HFA) 108 (90 Base) MCG/ACT inhaler 2 puff  2 puff Inhalation Q4H PRN Jomarie Longs, MD   2 puff at 05/30/16 2002  . alum & mag hydroxide-simeth (MAALOX/MYLANTA) 200-200-20 MG/5ML suspension 30 mL  30 mL  Oral Q4H PRN Thermon Leyland, NP      . ARIPiprazole (ABILIFY) tablet 5 mg  5 mg Oral QHS Jomarie Longs, MD   5 mg at 05/31/16 2124  . hydrocerin (EUCERIN) cream   Topical BID Thermon Leyland, NP      . ibuprofen (ADVIL,MOTRIN) tablet 400 mg  400 mg Oral Q6H PRN Thermon Leyland, NP   400 mg at 05/30/16 2000  . lamoTRIgine (LAMICTAL) tablet 25 mg  25 mg Oral QPM Jomarie Longs, MD   25 mg at 05/31/16 1712  . lisinopril (PRINIVIL,ZESTRIL) tablet 20 mg  20 mg Oral Daily Thermon Leyland, NP   20 mg at 06/01/16 0800  . LORazepam (ATIVAN) tablet 1 mg  1 mg Oral Q6H PRN Jomarie Longs, MD        Or  . LORazepam (ATIVAN) injection 1 mg  1 mg Intramuscular Q6H PRN Jomarie Longs, MD      . magic mouthwash  5 mL Oral QID Jomarie Longs, MD   5 mL at 06/01/16 0803  . magnesium hydroxide (MILK OF MAGNESIA) suspension 30 mL  30 mL Oral Daily PRN Thermon Leyland, NP      . OLANZapine zydis (ZYPREXA) disintegrating tablet 5 mg  5 mg Oral TID PRN Jomarie Longs, MD       Or  . OLANZapine (ZYPREXA) injection 5 mg  5 mg Intramuscular TID PRN Jomarie Longs, MD      . traZODone (DESYREL) tablet 50 mg  50 mg Oral QHS PRN Thermon Leyland, NP        Lab Results: No results found for this or any previous visit (from the past 48 hour(s)).  Blood Alcohol level:  Lab Results  Component Value Date   ETH 7* 05/29/2016   ETH 28* 12/01/2011    Metabolic Disorder Labs: No results found for: HGBA1C, MPG No results found for: PROLACTIN No results found for: CHOL, TRIG, HDL, CHOLHDL, VLDL, LDLCALC  Physical Findings: AIMS: Facial and Oral Movements Muscles of Facial Expression: None, normal Lips and Perioral Area: None, normal Jaw: None, normal Tongue: None, normal,Extremity Movements Upper (arms, wrists, hands, fingers): None, normal Lower (legs, knees, ankles, toes): None, normal, Trunk Movements Neck, shoulders, hips: None, normal, Overall Severity Severity of abnormal movements (highest score from questions above): None, normal Incapacitation due to abnormal movements: None, normal Patient's awareness of abnormal movements (rate only patient's report): No Awareness, Dental Status Current problems with teeth and/or dentures?: No Does patient usually wear dentures?: No  CIWA:  CIWA-Ar Total: 0 COWS:  COWS Total Score: 0  Musculoskeletal: Strength & Muscle Tone: within normal limits Gait & Station: normal Patient leans: N/A  Psychiatric Specialty Exam: Physical Exam  Nursing note and vitals reviewed.   Review of Systems  Psychiatric/Behavioral: Positive for depression, suicidal  ideas, hallucinations and substance abuse. The patient is nervous/anxious.   All other systems reviewed and are negative.   Blood pressure 166/100, pulse 57, temperature 97.8 F (36.6 C), temperature source Oral, resp. rate 18, height 5\' 9"  (1.753 m), weight 81.647 kg (180 lb), SpO2 100 %.Body mass index is 26.57 kg/(m^2).  General Appearance: Fairly Groomed  Eye Contact:  Fair  Speech:  Pressured  Volume:  Increased  Mood:  Angry, Anxious and Irritable improving  Affect:  Labile  Thought Process:  Irrelevant and Descriptions of Associations: Circumstantial  Orientation:  Full (Time, Place, and Person)  Thought Content:  Delusions, Hallucinations: Auditory Command:  asking him to kill  self, Paranoid Ideation and Rumination  Suicidal Thoughts:  Yes.  with intent/plan able to contract for safety on the unit  Homicidal Thoughts:  No  Memory:  Immediate;   Fair Recent;   Fair Remote;   Fair  Judgement:  Impaired  Insight:  Shallow  Psychomotor Activity:  Increased and Restlessness  Concentration:  Concentration: Fair and Attention Span: Fair  Recall:  [employer] of Knowledge:  Fair  Language:  Fair  Akathisia:  No  Handed:  Right  AIMS (if indicated):     Assets:  Desire for Improvement  ADL's:  Intact  Cognition:  WNL  Sleep:  Number of Hours: 4.5     Treatment Plan Summary:[name] is a 50 y.o. AA male , who is single , on SSD, who presented reporting symptoms of depression and suicidal ideation with a plan to jump off a building. Pt with mood lability/irritability, continued SI with hx of multiple suicide attempts, will need inpatient stay. Daily contact with patient to assess and evaluate symptoms and progress in treatment and Medication management   Will increase Abilify to 7.5 mg po qhs for psychosis/mood sx. Plan to DC pt on Abilify Maintena IM 400 mg - if he tolerates the po. Will continue Lamictal 25 mg po daily for mood sx. Restarted home medications. Will  continue Magic mouth wash for oral sore. Will continue PRN medications as per agitation protocol. Will continue to monitor vitals ,medication compliance and treatment side effects while patient is here.  Will monitor for medical issues as well as call consult as needed.  Reviewed labs- pending  tsh, lipid panel, hba1c, pl- will reorder for tomorrow.EKG shows qt prolonged , but qtc - wnl. Will monitor. CSW will continue  working on disposition.  Patient to participate in therapeutic milieu .   [name] Asa, MD 06/01/2016, 9:34 AM

## 2016-06-01 NOTE — [facility] Group Notes (Signed)
[facility] [name] Group Therapy  06/01/2016 11:15 AM  Type of Therapy:  Group Therapy  Participation Level:  Active  Participation Quality:  Monopolizing  Affect:  Blunted  Cognitive:  Alert  Insight:  Limited  Engagement in Therapy:  Limited  Modes of Intervention:  Redirection, Socialization and Support  Summary of Progress/Problems: The main focus of today's process group was to identify the patient's current support system and decide on other supports that can be put in place. An emphasis was placed on using counselor, doctor, therapy groups, 12-step groups, and problem-specific support groups to expand supports. There was also a discussion about what constitutes a healthy support versus an unhealthy support. The patient came in late and left group early. Patient appeared content to monopolize conversation yet when facilitator moved on to others he muttered and left the room.    Carney Bern, [name]

## 2016-06-01 NOTE — Progress Notes (Signed)
Patient ID: [name], male   DOB: [birth year], 50 y.o.   MRN: [id number]    D: Pt has been appropriate on the unit today. Pt attempted to attend groups and engage in treatment. Pt took all medications without any problems. Pt reported that his depression was 8, his hopelessness was a 10, and his anxiety was a 5. Pt reported that he continues to be positive for SI, however was able to contract for safety. Pt reported being negative HI, pt reported that he was positive AH/VH. Pt reported that his goal for today was to have a good day. A: 15 min checks continued for patient safety. R: Pt safety maintained.

## 2016-06-01 NOTE — Progress Notes (Signed)
Adult Psychoeducational Group Note  Date:  06/01/2016 Time:  8:15 PM  Group Topic/Focus:  Wrap-Up Group:   The focus of this group is to help patients review their daily goal of treatment and discuss progress on daily workbooks.  Participation Level:  Did Not Attend  Pt did not attend wrap-up group.    [name] 06/01/2016, 8:23 PM

## 2016-06-02 LAB — GC/CHLAMYDIA PROBE AMP (~~LOC~~) NOT AT ARMC
CHLAMYDIA, DNA PROBE: NEGATIVE
NEISSERIA GONORRHEA: NEGATIVE

## 2016-06-02 LAB — LIPID PANEL
CHOLESTEROL: 194 mg/dL (ref 0–200)
HDL: 103 mg/dL (ref >40)
LDL Cholesterol: 81 mg/dL (ref 0–99)
TRIGLYCERIDES: 52 mg/dL (ref <150)
Total CHOL/HDL Ratio: 1.9 RATIO
VLDL: 10 mg/dL (ref 0–40)

## 2016-06-02 LAB — HIV ANTIBODY (ROUTINE TESTING W REFLEX): HIV SCREEN 4TH GENERATION: NONREACTIVE

## 2016-06-02 MED ORDER — FLUTICASONE PROPIONATE 50 MCG/ACT NA SUSP
1.0000 | Freq: Every day | NASAL | Status: DC
  Administered 2016-06-02 – 2016-06-03 (×2): 1 via NASAL
  Filled 2016-06-02 (×2): qty 16

## 2016-06-02 MED ORDER — NAPROXEN 500 MG PO TABS
500.0000 mg | ORAL_TABLET | Freq: Two times a day (BID) | ORAL | Status: DC
  Administered 2016-06-02 – 2016-06-05 (×6): 500 mg via ORAL
  Filled 2016-06-02 (×12): qty 1

## 2016-06-02 NOTE — Progress Notes (Signed)
D.  Pt irritable on approach, asks "why do you wake me up for medications and not for snacks?"  Pt did not go to evening wrap up group.  Pt would not answer questions, states he would come up later for his medications.  A.  Pt given snacks by MHTs at 2124, states "there is no problem now".  Pt refused magic mouthwash, but did take Abilify.  Support and encouragement offered  R.  Pt remains safe on the unit, will continue to monitor.

## 2016-06-02 NOTE — Progress Notes (Signed)
DAR NOTE: Patient presents with anxious mood and affect. Reports pain, auditory and visual hallucinations.  Rates depression at 0, hopelessness at 0, and anxiety at .  Maintained on routine safety checks.  Medications given as prescribed.  Support and encouragement offered as needed.  Attended group and participated.  Patient preoccupied with finding placement at the [facility].  Patient observed socializing with peers in the dayroom.
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
## 2016-06-02 NOTE — Progress Notes (Deleted)
1:1 Note: Patient maintained on constant supervision for safety.  Patient remained in his this evening.  Patient refused 5 pm oral medication.  Zyprexa 10 mg IM injection given.  Routine safety checks continues.  Patient in bed with eyes closed.  No safety concern reported or noted.

## 2016-06-02 NOTE — Progress Notes (Signed)
Adult Psychoeducational Group Note  Date:  06/02/2016 Time:  8:15 PM  Group Topic/Focus:  Wrap-Up Group:   The focus of this group is to help patients review their daily goal of treatment and discuss progress on daily workbooks.  Participation Level:  Did Not Attend  Pt did not attend wrap-up group. Pt declined invitation.    [name] 06/02/2016, 8:26 PM

## 2016-06-02 NOTE — Progress Notes (Signed)
[address] [facility] MD Progress Note  06/02/2016 10:36 AM [name]  MRN:  [id number] Subjective: Patient states that he is not good today.  "I felt really good yesterday and today my mind is all over the place."  Objective:[name] is a 50 y.o. AA male , who is single , on SSD, who presented reporting symptoms of depression and suicidal ideation with a plan to jump off a building.  Discussed patient with nursing and CSW. Patient seen and chart reviewed today.  Patient expressed frustration and wanted to get meds changed.  Discussed that meds may not work immediately.  Patient stated good undersatnding.  Will continue to monitor.   Tolerating medications. He states that "it was awhile" since he last saw a mental health provider and he is unable to recall last time he was on meds and what they were.   Will continue to encourage and support.   Principal Problem: Schizoaffective disorder, bipolar type (HCC) Diagnosis:   Patient Active Problem List   Diagnosis Date Noted  . Schizoaffective disorder, bipolar type (HCC) [F25.0] 05/31/2016  . Cocaine use disorder, severe, dependence (HCC) [F14.20] 05/31/2016  . Alcohol use disorder, mild, abuse [F10.10] 05/31/2016  . Essential hypertension [I10] 05/31/2016   Total Time spent with patient: 25 minutes  Past Psychiatric History: Please see H&P.   Past Medical History:  Past Medical History  Diagnosis Date  . Hypertension   . Depression   . Asthma    History reviewed. No pertinent past surgical history. Family History:  Family History  Problem Relation Age of Onset  . Drug abuse Mother   . Suicidality Mother    Family Psychiatric  History: Please see H&P.  Social History: Please see H&P.  History  Alcohol Use  . Yes    Comment: infrequently     History  Drug Use  . Yes  . Special: Cocaine    Social History   Social History  . Marital Status: Single    Spouse Name: N/A  . Number of Children: N/A  . Years of Education: N/A    Social History Main Topics  . Smoking status: Current Every Day Smoker -- 1.00 packs/day    Types: Cigarettes  . Smokeless tobacco: None  . Alcohol Use: Yes     Comment: infrequently  . Drug Use: Yes    Special: Cocaine  . Sexual Activity: Not Asked   Other Topics Concern  . None   Social History Narrative   Additional Social History:    Pain Medications: denies Prescriptions: denies Over the Counter: denies History of alcohol / drug use?: Yes Longest period of sobriety (when/how long): 6 1/2 years in 90's-2002 Negative Consequences of Use: Financial Withdrawal Symptoms: Other (Comment) (He refused to answer questions) Name of Substance 1: alcohol--liquor and beer 1 - Age of First Use: doesn't remember 1 - Amount (size/oz): variable 1 - Frequency: every few days 1 - Duration: years 1 - Last Use / Amount: wouldn't answer for admission nurse Name of Substance 2: cocaine (per [facility] assessment) 2 - Age of First Use: Unknown (He refused to answer questions) 2 - Amount (size/oz): variable 2 - Frequency: "every now and then" 2 - Duration: years 2 - Last Use / Amount: unknown (He refused to answer questions)                Sleep: Poor  Appetite:  Fair  Current Medications: Current Facility-Administered Medications  Medication Dose Route Frequency Provider Last Rate Last Dose  .  albuterol (PROVENTIL HFA;VENTOLIN HFA) 108 (90 Base) MCG/ACT inhaler 2 puff  2 puff Inhalation Q4H PRN Jomarie Longs, MD   2 puff at 05/30/16 2002  . alum & mag hydroxide-simeth (MAALOX/MYLANTA) 200-200-20 MG/5ML suspension 30 mL  30 mL Oral Q4H PRN Thermon Leyland, NP      . ARIPiprazole (ABILIFY) tablet 7.5 mg  7.5 mg Oral QHS Saramma Eappen, MD   7.5 mg at 06/01/16 2114  . hydrocerin (EUCERIN) cream   Topical BID Thermon Leyland, NP      . ibuprofen (ADVIL,MOTRIN) tablet 400 mg  400 mg Oral Q6H PRN Thermon Leyland, NP   400 mg at 05/30/16 2000  . lamoTRIgine (LAMICTAL) tablet 25 mg  25 mg Oral  QPM Jomarie Longs, MD   25 mg at 06/01/16 1638  . lisinopril (PRINIVIL,ZESTRIL) tablet 20 mg  20 mg Oral Daily Thermon Leyland, NP   20 mg at 06/02/16 0810  . LORazepam (ATIVAN) tablet 1 mg  1 mg Oral Q6H PRN Jomarie Longs, MD       Or  . LORazepam (ATIVAN) injection 1 mg  1 mg Intramuscular Q6H PRN Jomarie Longs, MD      . magic mouthwash  5 mL Oral QID Jomarie Longs, MD   5 mL at 06/02/16 1014  . magnesium hydroxide (MILK OF MAGNESIA) suspension 30 mL  30 mL Oral Daily PRN Thermon Leyland, NP      . naproxen (NAPROSYN) tablet 500 mg  500 mg Oral BID WC Adonis Brook, NP      . OLANZapine zydis (ZYPREXA) disintegrating tablet 5 mg  5 mg Oral TID PRN Jomarie Longs, MD       Or  . OLANZapine (ZYPREXA) injection 5 mg  5 mg Intramuscular TID PRN Jomarie Longs, MD      . traZODone (DESYREL) tablet 50 mg  50 mg Oral QHS PRN Thermon Leyland, NP        Lab Results:  Results for orders placed or performed during the hospital encounter of 05/30/16 (from the past 48 hour(s))  Lipid panel     Status: None   Collection Time: 06/02/16  6:10 AM  Result Value Ref Range   Cholesterol 194 0 - 200 mg/dL   Triglycerides 52 <161 mg/dL   HDL 096 >04 mg/dL   Total CHOL/HDL Ratio 1.9 RATIO   VLDL 10 0 - 40 mg/dL   LDL Cholesterol 81 0 - 99 mg/dL    Comment:        Total Cholesterol/HDL:CHD Risk Coronary Heart Disease Risk Table                     Men   Women  1/2 Average Risk   3.4   3.3  Average Risk       5.0   4.4  2 X Average Risk   9.6   7.1  3 X Average Risk  23.4   11.0        Use the calculated Patient Ratio above and the CHD Risk Table to determine the patient's CHD Risk.        ATP III CLASSIFICATION (LDL):  <100     mg/dL   Optimal  540-981  mg/dL   Near or Above                    Optimal  130-159  mg/dL   Borderline  191-478  mg/dL   High  >295  mg/dL   Very High Performed at [facility]     Blood Alcohol level:  Lab Results  Component Value Date   [facility] 7*  05/29/2016   ETH 28* 12/01/2011    Metabolic Disorder Labs: No results found for: HGBA1C, MPG No results found for: PROLACTIN Lab Results  Component Value Date   CHOL 194 06/02/2016   TRIG 52 06/02/2016   HDL 103 06/02/2016   CHOLHDL 1.9 06/02/2016   VLDL 10 06/02/2016   LDLCALC 81 06/02/2016    Physical Findings: AIMS: Facial and Oral Movements Muscles of Facial Expression: None, normal Lips and Perioral Area: None, normal Jaw: None, normal Tongue: None, normal,Extremity Movements Upper (arms, wrists, hands, fingers): None, normal Lower (legs, knees, ankles, toes): None, normal, Trunk Movements Neck, shoulders, hips: None, normal, Overall Severity Severity of abnormal movements (highest score from questions above): None, normal Incapacitation due to abnormal movements: None, normal Patient's awareness of abnormal movements (rate only patient's report): No Awareness, Dental Status Current problems with teeth and/or dentures?: No Does patient usually wear dentures?: No  CIWA:  CIWA-Ar Total: 0 COWS:  COWS Total Score: 0  Musculoskeletal: Strength & Muscle Tone: within normal limits Gait & Station: normal Patient leans: N/A  Psychiatric Specialty Exam: Physical Exam  Nursing note and vitals reviewed.   Review of Systems  Psychiatric/Behavioral: Positive for depression, suicidal ideas, hallucinations and substance abuse. The patient is nervous/anxious.   All other systems reviewed and are negative.   Blood pressure 169/100, pulse 62, temperature 98.4 F (36.9 C), temperature source Oral, resp. rate 12, height 5\' 9"  (1.753 m), weight 81.647 kg (180 lb), SpO2 100 %.Body mass index is 26.57 kg/(m^2).  General Appearance: Fairly Groomed  Eye Contact:  Fair  Speech:  Pressured  Volume:  Increased  Mood:  Angry, Anxious and Irritable improving  Affect:  Labile  Thought Process:  Irrelevant and Descriptions of Associations: Circumstantial  Orientation:  Full (Time,  Place, and Person)  Thought Content:  Delusions, Hallucinations: Auditory Command:  asking him to kill self, Paranoid Ideation and Rumination  Suicidal Thoughts:  Yes.  with intent/plan able to contract for safety on the unit  Homicidal Thoughts:  No  Memory:  Immediate;   Fair Recent;   Fair Remote;   Fair  Judgement:  Impaired  Insight:  Shallow  Psychomotor Activity:  Increased and Restlessness  Concentration:  Concentration: Fair and Attention Span: Fair  Recall:  [employer] of Knowledge:  Fair  Language:  Fair  Akathisia:  No  Handed:  Right  AIMS (if indicated):     Assets:  Desire for Improvement  ADL's:  Intact  Cognition:  WNL  Sleep:  Number of Hours: 6.5    Treatment Plan Summary:Jeffre Guia is a 50 y.o. AA male , who is single , on SSD, who presented reporting symptoms of depression and suicidal ideation with a plan to jump off a building. Pt with mood lability/irritability, continued SI with hx of multiple suicide attempts,  inpatient stay.    Daily contact with patient to assess and evaluate symptoms and progress in treatment and Medication management   Abilify to 7.5 mg po qhs for psychosis/mood sx. Plan to DC pt on Abilify Maintena IM 400 mg - if he tolerates the po. Will continue Lamictal 25 mg po daily for mood sx. Restarted home medications. Will continue Magic mouth wash for oral sore. Will continue PRN medications as per agitation protocol. Will continue to monitor vitals ,medication compliance  and treatment side effects while patient is here.  Will monitor for medical issues as well as call consult as needed.  Reviewed labs- pending  tsh, hba1c, PL-in process.  lipid panel WNL EKG shows qt prolonged , but qtc - wnl. Will monitor. CSW will continue  working on disposition.  Patient to participate in therapeutic milieu .   Lindwood Qua, NP  [facility]  06/02/2016, 10:36 AM Agree with NP progress note as above

## 2016-06-02 NOTE — Tx Team (Signed)
Interdisciplinary Treatment Plan Update (Adult)  Date:  06/02/2016   Time Reviewed:  8:11 AM   Progress in Treatment: Attending groups: Yes. Participating in groups:  Yes. Taking medication as prescribed:  Yes. Tolerating medication:  Yes. Family/Significant other contact made:  No Patient understands diagnosis:  Yes  As evidenced by seeking help with getting into a structured living situation Discussing patient identified problems/goals with staff:  Yes, see initial care plan. Medical problems stabilized or resolved:  Yes. Denies suicidal/homicidal ideation: Yes. Issues/concerns per patient self-inventory:  No. Other:  New problem(s) identified:  Discharge Plan or Barriers: see below  Reason for Continuation of Hospitalization: Depression Hallucinations Medication stabilization Other; describe mood instability  Comments:  Pt appeared as very angry and irritable , with mood lability . Pt was seen as getting mad and loud several times during the evaluation. Pt required a lot of redirection. Pt reported several stressors , not being on the right medications , having a drug addiction and getting help with it . Pt reports he has tried seroquel in the past which made him very groggy. Pt reports AH - asking him to kill self. Pt confirmed above hx - see above - has depressive sx as well as has SI with plan. Pt reports he abuses cocaine daily - several grams - unable to give exact quantity - has been abusing it for a very long time. Will start Abilify 5 mg po qhs for psychosis/mood sx. Plan to DC pt on Abilify Maintena IM 400 mg - if he tolerates the po. Will add Lamictal 25 mg po daily for mood sx. Will restart home medications. Will add Magic mouth wash for oral sore. Will add PRN medications as per agitation protocol.  Estimated length of stay: 3-5 days  New goal(s):  Review of initial/current patient goals per problem list:   Review of initial/current patient goals per problem  list:  1. Goal(s): Patient will participate in aftercare plan   Met:    Target date: 3-5 days post admission date   As evidenced by: Patient will participate within aftercare plan AEB aftercare provider and housing plan at discharge being identified. 06/02/16:  States he plans to go to [employer]   2. Goal (s): Patient will exhibit decreased depressive symptoms and suicidal ideations.   Met: No   Target date: 3-5 days post admission date   As evidenced by: Patient will utilize self rating of depression at 3 or below and demonstrate decreased signs of depression or be deemed stable for discharge by MD. 06/02/16:  Denies SI; rates depression a 6 today      5. Goal(s): Patient will demonstrate decreased signs of psychosis  * Met: No  * Target date: 3-5 days post admission date  * As evidenced by: Patient will demonstrate decreased frequency of AVH or return to baseline function 06/02/16:  C/O AH, VH     6. Goal (s): Patient will demonstrate decreased signs of mania  * Met: No  * Target date: 3-5 days post admission date  * As evidenced by: Patient demonstrate decreased signs of mania AEB decreased mood instability and return to baseline functioning 06/02/16:  At admission, and as recently as yesterday, pt was irritable, angry refusing to discuss details of needs and plan      Attendees: Patient:  06/02/2016 8:11 AM   Family:   06/02/2016 8:11 AM   Physician:  Ursula Alert, MD 06/02/2016 8:11 AM   Nursing:   Hedy Jacob, RN  06/02/2016 8:11 AM   CSW:    Roque Lias, [name]   06/02/2016 8:11 AM   Other:  06/02/2016 8:11 AM   Other:   06/02/2016 8:11 AM   Other:  Lars Pinks, Nurse CM 06/02/2016 8:11 AM   Other:   06/02/2016 8:11 AM   Other:  Norberto Sorenson, [place]  06/02/2016 8:11 AM   Other:  06/02/2016 8:11 AM   Other:  06/02/2016 8:11 AM   Other:  06/02/2016 8:11 AM   Other:  06/02/2016 8:11 AM   Other:  06/02/2016 8:11 AM   Other:   06/02/2016 8:11 AM    Scribe for  Treatment Team:   Trish Mage, 06/02/2016 8:11 AM

## 2016-06-02 NOTE — [facility] Group Notes (Signed)
[facility] [name] Group Therapy  06/02/2016 2:58 PM   Type of Therapy:  Group Therapy  Participation Level:  Active  Participation Quality:  Attentive  Affect:  Appropriate  Cognitive:  Appropriate  Insight:  Improving  Engagement in Therapy:  Engaged  Modes of Intervention:  Clarification, Education, Exploration and Socialization  Summary of Progress/Problems: Today's group focused on relapse prevention.  We defined the term, and then brainstormed on ways to prevent relapse.  In and out a couple of times, but engaged and active participant when present.  Talked about hitting his "pain threshold," and how when he reached it he came seeking help,  "Staying at the same playground was taking me deeper and deeper into a place I didn't want to be-I've been there before and know I need to avoid that."  Believes he is in recovery "because I came here to stop the cycle."  [place] B 06/02/2016 , 2:58 PM

## 2016-06-03 LAB — HEMOGLOBIN A1C
Hgb A1c MFr Bld: 5.7 % — ABNORMAL HIGH (ref 4.8–5.6)
MEAN PLASMA GLUCOSE: 117 mg/dL

## 2016-06-03 LAB — HEPATITIS PANEL, ACUTE
Hep A IgM: NEGATIVE
Hep B C IgM: NEGATIVE
Hepatitis B Surface Ag: NEGATIVE

## 2016-06-03 LAB — PROLACTIN: PROLACTIN: 9.6 ng/mL (ref 4.0–15.2)

## 2016-06-03 MED ORDER — TRAZODONE HCL 100 MG PO TABS
100.0000 mg | ORAL_TABLET | Freq: Every day | ORAL | Status: DC
  Filled 2016-06-03 (×5): qty 1

## 2016-06-03 MED ORDER — ARIPIPRAZOLE 10 MG PO TABS
10.0000 mg | ORAL_TABLET | Freq: Every day | ORAL | Status: DC
  Filled 2016-06-03 (×5): qty 1

## 2016-06-03 NOTE — Progress Notes (Signed)
Pt's bp was elevated at 165/102. When informed that writer wanted to give him his morning dose of lisinopril pt stated, "nah, I'll take it when I get back". Pt has no signs or symptoms of elevated bp.

## 2016-06-03 NOTE — Plan of Care (Signed)
Problem: Activity: Goal: Interest or engagement in leisure activities will improve Outcome: Progressing Pt playing cards on the unit.  Problem: Health Behavior/Discharge Planning: Goal: Compliance with therapeutic regimen will improve Outcome: Progressing Pt taking medications as prescribed  Problem: Self-Concept: Goal: Ability to verbalize positive feelings about self will improve Outcome: Not Progressing Pt continues to express si thoughts

## 2016-06-03 NOTE — Progress Notes (Signed)
Recreation Therapy Notes  Animal-Assisted Activity (AAA) Program Checklist/Progress Notes Patient Eligibility Criteria Checklist & Daily Group note for Rec Tx Intervention  Date: 06.27.2017 Time: 2:45pm Location: [address]    AAA/T Program Assumption of Risk Form signed by Patient/ or Parent Legal Guardian Yes  Patient is free of allergies or sever asthma Yes  Patient reports no fear of animals Yes  Patient reports no history of cruelty to animals Yes  Patient understands his/her participation is voluntary Yes  Patient washes hands before animal contact Yes  Patient washes hands after animal contact Yes  Behavioral Response: Appropriate, Engaged, Attentive   Education: Charity fundraiser, Appropriate Animal Interaction   Education Outcome: Acknowledges education.   Clinical Observations/Feedback: Patient discussed with MD for appropriateness in pet therapy session. Both LRT and MD agree patient is appropriate for participation. Patient offered participation in session and signed necessary consent form without issue. Patient attended session, interacted appropriately with therapy dog and peers in session.   [name] Liz Pinho, LRT/CTRS         Glyndon Tursi L 06/03/2016 11:27 AM

## 2016-06-03 NOTE — Progress Notes (Signed)
D: Pt endorsing SI/ AH- contracts for safety. Pt irritable, didn't want to talk to Clinical research associate. Pt forwards little information  A: Pt was offered support and encouragement. Pt was given scheduled medications. Pt was encourage to attend groups. Q 15 minute checks were done for safety.   R: safety maintained on unit.

## 2016-06-03 NOTE — Progress Notes (Signed)
D:Pt reports that he has auditory hallucinations and si thoughts. Pt's mood is labile. He would not elaborate on the si thoughts and pt was more pleasant in the afternoon showing staff card tricks. Pt c/o a headache today and was given prn medication. A:Offered support and 15 minute checks. R:Safety maintained on the unit.

## 2016-06-03 NOTE — Progress Notes (Signed)
[facility] MD Progress Note  06/03/2016 2:54 PM [name]  MRN:  [id number] Subjective: Patient states " I am not well, the voices are raging and they are distressing."   Objective:[name] is a 50 y.o. AA male , who is single , on SSD, who presented reporting symptoms of depression and suicidal ideation with a plan to jump off a building.  Patient seen and chart reviewed.Discussed patient with treatment team.  Patient today continues to have on and off mood sx as well as anger issues. Pt with worsening AH that are asking him to kill self. Pt reports feeling depressed as well as has sleep issues. Per staff - pt often seen as pacing in the hallways. Pt denies any ADRs of medications. Will continue to encourage and support.     Principal Problem: Schizoaffective disorder, bipolar type (HCC) Diagnosis:   Patient Active Problem List   Diagnosis Date Noted  . Schizoaffective disorder, bipolar type (HCC) [F25.0] 05/31/2016  . Cocaine use disorder, severe, dependence (HCC) [F14.20] 05/31/2016  . Alcohol use disorder, mild, abuse [F10.10] 05/31/2016  . Essential hypertension [I10] 05/31/2016   Total Time spent with patient: 25 minutes  Past Psychiatric History: Please see H&P.   Past Medical History:  Past Medical History  Diagnosis Date  . Hypertension   . Depression   . Asthma    History reviewed. No pertinent past surgical history. Family History:  Family History  Problem Relation Age of Onset  . Drug abuse Mother   . Suicidality Mother    Family Psychiatric  History: Please see H&P.  Social History: Please see H&P.  History  Alcohol Use  . Yes    Comment: infrequently     History  Drug Use  . Yes  . Special: Cocaine    Social History   Social History  . Marital Status: Single    Spouse Name: N/A  . Number of Children: N/A  . Years of Education: N/A   Social History Main Topics  . Smoking status: Current Every Day Smoker -- 1.00 packs/day    Types:  Cigarettes  . Smokeless tobacco: None  . Alcohol Use: Yes     Comment: infrequently  . Drug Use: Yes    Special: Cocaine  . Sexual Activity: Not Asked   Other Topics Concern  . None   Social History Narrative   Additional Social History:    Pain Medications: denies Prescriptions: denies Over the Counter: denies History of alcohol / drug use?: Yes Longest period of sobriety (when/how long): 6 1/2 years in 90's-2002 Negative Consequences of Use: Financial Withdrawal Symptoms: Other (Comment) (He refused to answer questions) Name of Substance 1: alcohol--liquor and beer 1 - Age of First Use: doesn't remember 1 - Amount (size/oz): variable 1 - Frequency: every few days 1 - Duration: years 1 - Last Use / Amount: wouldn't answer for admission nurse Name of Substance 2: cocaine (per [facility] assessment) 2 - Age of First Use: Unknown (He refused to answer questions) 2 - Amount (size/oz): variable 2 - Frequency: "every now and then" 2 - Duration: years 2 - Last Use / Amount: unknown (He refused to answer questions)                Sleep: Poor  Appetite:  Fair  Current Medications: Current Facility-Administered Medications  Medication Dose Route Frequency Provider Last Rate Last Dose  . albuterol (PROVENTIL HFA;VENTOLIN HFA) 108 (90 Base) MCG/ACT inhaler 2 puff  2 puff Inhalation Q4H PRN Emiyah Spraggins  Jane Broughton, MD   2 puff at 06/03/16 1259  . alum & mag hydroxide-simeth (MAALOX/MYLANTA) 200-200-20 MG/5ML suspension 30 mL  30 mL Oral Q4H PRN Thermon Leyland, NP      . ARIPiprazole (ABILIFY) tablet 10 mg  10 mg Oral QHS Josedaniel Haye, MD      . fluticasone (FLONASE) 50 MCG/ACT nasal spray 1 spray  1 spray Each Nare Daily Adonis Brook, NP   1 spray at 06/03/16 0819  . hydrocerin (EUCERIN) cream   Topical BID Thermon Leyland, NP      . ibuprofen (ADVIL,MOTRIN) tablet 400 mg  400 mg Oral Q6H PRN Thermon Leyland, NP   400 mg at 06/03/16 1159  . lamoTRIgine (LAMICTAL) tablet 25 mg  25 mg Oral  QPM Shawne Eskelson, MD   25 mg at 06/02/16 1715  . lisinopril (PRINIVIL,ZESTRIL) tablet 20 mg  20 mg Oral Daily Thermon Leyland, NP   20 mg at 06/03/16 0819  . LORazepam (ATIVAN) tablet 1 mg  1 mg Oral Q6H PRN Jomarie Longs, MD       Or  . LORazepam (ATIVAN) injection 1 mg  1 mg Intramuscular Q6H PRN Jomarie Longs, MD      . magic mouthwash  5 mL Oral QID Jomarie Longs, MD   5 mL at 06/03/16 1259  . magnesium hydroxide (MILK OF MAGNESIA) suspension 30 mL  30 mL Oral Daily PRN Thermon Leyland, NP      . naproxen (NAPROSYN) tablet 500 mg  500 mg Oral BID WC Adonis Brook, NP   500 mg at 06/03/16 0819  . OLANZapine zydis (ZYPREXA) disintegrating tablet 5 mg  5 mg Oral TID PRN Jomarie Longs, MD       Or  . OLANZapine (ZYPREXA) injection 5 mg  5 mg Intramuscular TID PRN Jomarie Longs, MD      . traZODone (DESYREL) tablet 100 mg  100 mg Oral QHS Jomarie Longs, MD        Lab Results:  Results for orders placed or performed during the hospital encounter of 05/30/16 (from the past 48 hour(s))  RPR     Status: None   Collection Time: 06/01/16  6:25 PM  Result Value Ref Range   RPR Ser Ql Non Reactive Non Reactive    Comment: (NOTE) Performed At: [facility] [address] [place], [name] [id number] Mila Homer MD [id number] Performed at [facility]   Hepatitis panel, acute     Status: None   Collection Time: 06/01/16  6:25 PM  Result Value Ref Range   Hepatitis B Surface Ag Negative Negative   HCV Ab <0.1 0.0 - 0.9 s/co ratio    Comment: (NOTE)                                  Negative:     < 0.8                             Indeterminate: 0.8 - 0.9                                  Positive:     > 0.9 The CDC recommends that a positive HCV antibody result be followed up with a HCV Nucleic Acid Amplification test (528413). Performed  At: [facility] [address] [place], [name] [id number] Mila Homer MD ZS:[id number]    Hep A IgM  Negative Negative   Hep B C IgM Negative Negative    Comment: Performed at [facility]  HIV antibody     Status: None   Collection Time: 06/01/16  6:25 PM  Result Value Ref Range   HIV Screen 4th Generation wRfx Non Reactive Non Reactive    Comment: (NOTE) Performed At: [facility] [address] [place], [name] [id number] Mila Homer MD [id number] Performed at [facility]   Hemoglobin A1c     Status: Abnormal   Collection Time: 06/02/16  6:10 AM  Result Value Ref Range   Hgb A1c MFr Bld 5.7 (H) 4.8 - 5.6 %    Comment: (NOTE)         Pre-diabetes: 5.7 - 6.4         Diabetes: >6.4         Glycemic control for adults with diabetes: <7.0    Mean Plasma Glucose 117 mg/dL    Comment: (NOTE) Performed At: [facility] [address] [place], [name] [id number] Mila Homer MD [id number] Performed at [facility]   Lipid panel     Status: None   Collection Time: 06/02/16  6:10 AM  Result Value Ref Range   Cholesterol 194 0 - 200 mg/dL   Triglycerides 52 <315 mg/dL   HDL 945 >85 mg/dL   Total CHOL/HDL Ratio 1.9 RATIO   VLDL 10 0 - 40 mg/dL   LDL Cholesterol 81 0 - 99 mg/dL    Comment:        Total Cholesterol/HDL:CHD Risk Coronary Heart Disease Risk Table                     Men   Women  1/2 Average Risk   3.4   3.3  Average Risk       5.0   4.4  2 X Average Risk   9.6   7.1  3 X Average Risk  23.4   11.0        Use the calculated Patient Ratio above and the CHD Risk Table to determine the patient's CHD Risk.        ATP III CLASSIFICATION (LDL):  <100     mg/dL   Optimal  929-244  mg/dL   Near or Above                    Optimal  130-159  mg/dL   Borderline  628-638  mg/dL   High  >177     mg/dL   Very High Performed at [facility]   Prolactin     Status: None   Collection Time: 06/02/16  6:10 AM  Result Value Ref Range   Prolactin 9.6 4.0 - 15.2 ng/mL    Comment:  (NOTE) Performed At: [facility] [address] [place], [name] [id number] Mila Homer MD [id number] Performed at [facility]     Blood Alcohol level:  Lab Results  Component Value Date   [facility] 7* 05/29/2016   ETH 28* 12/01/2011    Metabolic Disorder Labs: Lab Results  Component Value Date   HGBA1C 5.7* 06/02/2016   MPG 117 06/02/2016   Lab Results  Component Value Date   PROLACTIN 9.6 06/02/2016   Lab Results  Component Value Date   CHOL  194 06/02/2016   TRIG 52 06/02/2016   HDL 103 06/02/2016   CHOLHDL 1.9 06/02/2016   VLDL 10 06/02/2016   LDLCALC 81 06/02/2016    Physical Findings: AIMS: Facial and Oral Movements Muscles of Facial Expression: None, normal Lips and Perioral Area: None, normal Jaw: None, normal Tongue: None, normal,Extremity Movements Upper (arms, wrists, hands, fingers): None, normal Lower (legs, knees, ankles, toes): None, normal, Trunk Movements Neck, shoulders, hips: None, normal, Overall Severity Severity of abnormal movements (highest score from questions above): None, normal Incapacitation due to abnormal movements: None, normal Patient's awareness of abnormal movements (rate only patient's report): No Awareness, Dental Status Current problems with teeth and/or dentures?: No Does patient usually wear dentures?: No  CIWA:  CIWA-Ar Total: 0 COWS:  COWS Total Score: 0  Musculoskeletal: Strength & Muscle Tone: within normal limits Gait & Station: normal Patient leans: N/A  Psychiatric Specialty Exam: Physical Exam  Nursing note and vitals reviewed.   Review of Systems  Psychiatric/Behavioral: Positive for depression, suicidal ideas, hallucinations and substance abuse. The patient is nervous/anxious and has insomnia.   All other systems reviewed and are negative.   Blood pressure 165/102, pulse 64, temperature 97.9 F (36.6 C), temperature source Oral, resp. rate 18, height 5\' 9"  (1.753 m), weight  81.647 kg (180 lb), SpO2 100 %.Body mass index is 26.57 kg/(m^2).  General Appearance: Fairly Groomed  Eye Contact:  Fair  Speech:  Pressured  Volume:  Increased  Mood:  Angry, Anxious and Irritable   Affect:  Labile  Thought Process:  Irrelevant and Descriptions of Associations: Circumstantial  Orientation:  Full (Time, Place, and Person)  Thought Content:  Delusions, Hallucinations: Auditory Command:  asking him to kill self, Paranoid Ideation and Rumination  Suicidal Thoughts:  Yes.  with intent/plan able to contract for safety on the unit  Homicidal Thoughts:  No  Memory:  Immediate;   Fair Recent;   Fair Remote;   Fair  Judgement:  Impaired  Insight:  Shallow  Psychomotor Activity:  Increased and Restlessness  Concentration:  Concentration: Fair and Attention Span: Fair  Recall:  [employer] of Knowledge:  Fair  Language:  Fair  Akathisia:  No  Handed:  Right  AIMS (if indicated):     Assets:  Desire for Improvement  ADL's:  Intact  Cognition:  WNL  Sleep:  Number of Hours: 4     Treatment Plan Summary:Essa Hardgrove is a 50 y.o. AA male , who is single , on SSD, who presented reporting symptoms of depression and suicidal ideation with a plan to jump off a building. Pt with mood lability/irritability, continued SI with hx of multiple suicide attempts, will need inpatient stay.Pt with worsening AH - continues to have SI. Will continue treatment. Daily contact with patient to assess and evaluate symptoms and progress in treatment and Medication management   Will increase Abilify to 10 mg po qhs for psychosis/mood sx. Plan to DC pt on Abilify Maintena IM 400 mg - if he tolerates the po. Will continue Lamictal 25 mg po daily for mood sx. Restarted home medications. Will continue Magic mouth wash for oral sore. Will continue PRN medications as per agitation protocol. Will continue to monitor vitals ,medication compliance and treatment side effects while patient is here.   Will monitor for medical issues as well as call consult as needed.  Reviewed labs-  tsh- wnl , lipid panel- wnl , hba1c- wnl , hiv - NR. EKG shows qt prolonged , but qtc -  wnl. Will monitor. CSW will continue  working on disposition. Pt continues to be motivated to go to a substance abuse program. Patient to participate in therapeutic milieu .   Juventino Pavone, MD 06/03/2016, 2:54 PM

## 2016-06-03 NOTE — [facility] Group Notes (Signed)
[facility] [name] Group Therapy  06/03/2016 , 11:12 AM   Type of Therapy:  Group Therapy  Participation Level:  Active  Participation Quality:  Attentive  Affect:  Appropriate  Cognitive:  Alert  Insight:  Improving  Engagement in Therapy:  Engaged  Modes of Intervention:  Discussion, Exploration and Socialization  Summary of Progress/Problems: Today's group focused on the term Diagnosis.  Participants were asked to define the term, and then pronounce whether it is a negative, positive or neutral term. Stayed the entire time, engaged throughout.  Earlier had let me know that [name] was no longer an option "because they keep your money and you are a slave to them."  Grumpy.  "I don't feel like talking today, and it's usually best when I feel like this to just keep my mouth shut."  Given positive feedback for self awareness, and for coming to group even though he did not feel like it.  [name] 06/03/2016 , 11:12 AM

## 2016-06-03 NOTE — Progress Notes (Signed)
   D: After the introduction writer asked the pt about his day. Pt sated [facility] and [facility] are "no good for him" However, pt would not go into detail as to why neither of those options would work. Pt did inform the writer that the "voices are messed right now". Pt has no other questions or concerns.   A:  Support and encouragement was offered. 15 min checks continued for safety.  R: Pt remains safe.

## 2016-06-04 MED ORDER — LAMOTRIGINE 25 MG PO TABS
50.0000 mg | ORAL_TABLET | Freq: Every evening | ORAL | Status: DC
  Administered 2016-06-04: 50 mg via ORAL
  Filled 2016-06-04 (×3): qty 2

## 2016-06-04 MED ORDER — SUMATRIPTAN SUCCINATE 25 MG PO TABS
25.0000 mg | ORAL_TABLET | ORAL | Status: DC | PRN
  Filled 2016-06-04: qty 1

## 2016-06-04 NOTE — [facility] Group Notes (Signed)
[facility] [employer] Group Therapy  06/04/2016 , 3:22 PM    Type of Therapy:  [employer] Presentation  Participation Level:  Active  Participation Quality:  Attentive  Affect:  Blunted  Cognitive:  Oriented  Insight:  Limited  Engagement in Therapy:  Engaged  Modes of Intervention:  Discussion, Education and Socialization  Summary of Progress/Problems:  Onalee Hua from [employer] came to present his recovery story and play the guitar.  Invited.  Chose to not attend.  Daryel Gerald B 06/04/2016 , 3:22 PM

## 2016-06-04 NOTE — Progress Notes (Signed)
"  I'm good, I don't need anything from you". Pt refusing to talk to writer, refusing medications.

## 2016-06-04 NOTE — Progress Notes (Signed)
[facility] MD Progress Note  06/04/2016 3:29 PM [name]  MRN:  [id number] Subjective: Patient states " I am better. I still have this headache . My voices getting bad and my headaches are related .'    Objective:[name] is a 50 y.o. AA male , who is single , on SSD, who presented reporting symptoms of depression and suicidal ideation with a plan to jump off a building.  Patient seen and chart reviewed.Discussed patient with treatment team.  Patient today continues to have on and off mood sx, but is improving. Pt is observed as more pleasant , more redirectable. Pt continues to have some depressive sx as well as AH - and states having a migraine headache due to the voices. Offered symptomatic treatment for the headaches- will continue to monitor.     Principal Problem: Schizoaffective disorder, bipolar type (HCC) Diagnosis:   Patient Active Problem List   Diagnosis Date Noted  . Schizoaffective disorder, bipolar type (HCC) [F25.0] 05/31/2016  . Cocaine use disorder, severe, dependence (HCC) [F14.20] 05/31/2016  . Alcohol use disorder, mild, abuse [F10.10] 05/31/2016  . Essential hypertension [I10] 05/31/2016   Total Time spent with patient: 25 minutes  Past Psychiatric History: Please see H&P.   Past Medical History:  Past Medical History  Diagnosis Date  . Hypertension   . Depression   . Asthma    History reviewed. No pertinent past surgical history. Family History:  Family History  Problem Relation Age of Onset  . Drug abuse Mother   . Suicidality Mother    Family Psychiatric  History: Please see H&P.  Social History: Please see H&P.  History  Alcohol Use  . Yes    Comment: infrequently     History  Drug Use  . Yes  . Special: Cocaine    Social History   Social History  . Marital Status: Single    Spouse Name: N/A  . Number of Children: N/A  . Years of Education: N/A   Social History Main Topics  . Smoking status: Current Every Day Smoker --  1.00 packs/day    Types: Cigarettes  . Smokeless tobacco: None  . Alcohol Use: Yes     Comment: infrequently  . Drug Use: Yes    Special: Cocaine  . Sexual Activity: Not Asked   Other Topics Concern  . None   Social History Narrative   Additional Social History:    Pain Medications: denies Prescriptions: denies Over the Counter: denies History of alcohol / drug use?: Yes Longest period of sobriety (when/how long): 6 1/2 years in 90's-2002 Negative Consequences of Use: Financial Withdrawal Symptoms: Other (Comment) (He refused to answer questions) Name of Substance 1: alcohol--liquor and beer 1 - Age of First Use: doesn't remember 1 - Amount (size/oz): variable 1 - Frequency: every few days 1 - Duration: years 1 - Last Use / Amount: wouldn't answer for admission nurse Name of Substance 2: cocaine (per [facility] assessment) 2 - Age of First Use: Unknown (He refused to answer questions) 2 - Amount (size/oz): variable 2 - Frequency: "every now and then" 2 - Duration: years 2 - Last Use / Amount: unknown (He refused to answer questions)                Sleep: Fair  Appetite:  Fair  Current Medications: Current Facility-Administered Medications  Medication Dose Route Frequency Provider Last Rate Last Dose  . albuterol (PROVENTIL HFA;VENTOLIN HFA) 108 (90 Base) MCG/ACT inhaler 2 puff  2 puff Inhalation  Q4H PRN Jomarie Longs, MD   2 puff at 06/03/16 1259  . alum & mag hydroxide-simeth (MAALOX/MYLANTA) 200-200-20 MG/5ML suspension 30 mL  30 mL Oral Q4H PRN Thermon Leyland, NP      . ARIPiprazole (ABILIFY) tablet 10 mg  10 mg Oral QHS Jomarie Longs, MD   10 mg at 06/04/16 0135  . fluticasone (FLONASE) 50 MCG/ACT nasal spray 1 spray  1 spray Each Nare Daily Adonis Brook, NP   1 spray at 06/03/16 0819  . hydrocerin (EUCERIN) cream   Topical BID Thermon Leyland, NP      . ibuprofen (ADVIL,MOTRIN) tablet 400 mg  400 mg Oral Q6H PRN Thermon Leyland, NP   400 mg at 06/03/16 1159  .  lamoTRIgine (LAMICTAL) tablet 50 mg  50 mg Oral QPM Tamyah Cutbirth, MD      . lisinopril (PRINIVIL,ZESTRIL) tablet 20 mg  20 mg Oral Daily Thermon Leyland, NP   20 mg at 06/04/16 0839  . LORazepam (ATIVAN) tablet 1 mg  1 mg Oral Q6H PRN Jomarie Longs, MD       Or  . LORazepam (ATIVAN) injection 1 mg  1 mg Intramuscular Q6H PRN Jomarie Longs, MD      . magic mouthwash  5 mL Oral QID Jomarie Longs, MD   5 mL at 06/04/16 0839  . magnesium hydroxide (MILK OF MAGNESIA) suspension 30 mL  30 mL Oral Daily PRN Thermon Leyland, NP      . naproxen (NAPROSYN) tablet 500 mg  500 mg Oral BID WC Adonis Brook, NP   500 mg at 06/04/16 0839  . OLANZapine zydis (ZYPREXA) disintegrating tablet 5 mg  5 mg Oral TID PRN Jomarie Longs, MD       Or  . OLANZapine (ZYPREXA) injection 5 mg  5 mg Intramuscular TID PRN Jomarie Longs, MD      . SUMAtriptan (IMITREX) tablet 25 mg  25 mg Oral Q2H PRN Jomarie Longs, MD      . traZODone (DESYREL) tablet 100 mg  100 mg Oral QHS Gentry Seeber, MD   100 mg at 06/04/16 0136    Lab Results:  No results found for this or any previous visit (from the past 48 hour(s)).  Blood Alcohol level:  Lab Results  Component Value Date   ETH 7* 05/29/2016   ETH 28* 12/01/2011    Metabolic Disorder Labs: Lab Results  Component Value Date   HGBA1C 5.7* 06/02/2016   MPG 117 06/02/2016   Lab Results  Component Value Date   PROLACTIN 9.6 06/02/2016   Lab Results  Component Value Date   CHOL 194 06/02/2016   TRIG 52 06/02/2016   HDL 103 06/02/2016   CHOLHDL 1.9 06/02/2016   VLDL 10 06/02/2016   LDLCALC 81 06/02/2016    Physical Findings: AIMS: Facial and Oral Movements Muscles of Facial Expression: None, normal Lips and Perioral Area: None, normal Jaw: None, normal Tongue: None, normal,Extremity Movements Upper (arms, wrists, hands, fingers): None, normal Lower (legs, knees, ankles, toes): None, normal, Trunk Movements Neck, shoulders, hips: None, normal, Overall  Severity Severity of abnormal movements (highest score from questions above): None, normal Incapacitation due to abnormal movements: None, normal Patient's awareness of abnormal movements (rate only patient's report): No Awareness, Dental Status Current problems with teeth and/or dentures?: No Does patient usually wear dentures?: No  CIWA:  CIWA-Ar Total: 0 COWS:  COWS Total Score: 0  Musculoskeletal: Strength & Muscle Tone: within normal limits Gait &  Station: normal Patient leans: N/A  Psychiatric Specialty Exam: Physical Exam  Nursing note and vitals reviewed.   Review of Systems  Psychiatric/Behavioral: Positive for depression, suicidal ideas, hallucinations and substance abuse. The patient is nervous/anxious and has insomnia.   All other systems reviewed and are negative.   Blood pressure 109/64, pulse 93, temperature 97.7 F (36.5 C), temperature source Oral, resp. rate 18, height  (1.753 m), weight 81.647 kg (180 lb), SpO2 100 %.Body mass index is 26.57 kg/(m^2).  General Appearance: Fairly Groomed  Eye Contact:  Fair  Speech:  Pressured improving  Volume:  Increased on and off  Mood:  Angry, Anxious and Irritable improving  Affect:  Labile  Thought Process:  Goal Directed and Descriptions of Associations: Circumstantial  Orientation:  Full (Time, Place, and Person)  Thought Content:  Delusions, Hallucinations: Auditory Command:  asking him to kill self, Paranoid Ideation and Rumination improving  Suicidal Thoughts:  No  able to contract for safety on the unit  Homicidal Thoughts:  No  Memory:  Immediate;   Fair Recent;   Fair Remote;   Fair  Judgement:  Impaired  Insight:  Shallow  Psychomotor Activity:  Restlessness  Concentration:  Concentration: Fair and Attention Span: Fair  Recall:  [employer] of Knowledge:  Fair  Language:  Fair  Akathisia:  No  Handed:  Right  AIMS (if indicated):     Assets:  Desire for Improvement  ADL's:  Intact  Cognition:   WNL  Sleep:  Number of Hours: 5.5     Treatment Plan Summary:[name] is a 50 y.o. AA male , who is single , on SSD, who presented reporting symptoms of depression and suicidal ideation with a plan to jump off a building. Pt with mood lability/irritability, continued SI with hx of multiple suicide attempts, will need inpatient stay.Pt with worsening AH - continues to have SI. Will continue treatment. Daily contact with patient to assess and evaluate symptoms and progress in treatment and Medication management   Will continue  Abilify 10 mg po qhs for psychosis/mood sx. Plan to DC pt on Abilify Maintena IM 400 mg - if he tolerates the po. Will increase Lamictal to 50 mg po daily for mood sx. Restarted home medications. Will continue Magic mouth wash for oral sore. Will continue PRN medications as per agitation protocol. Will continue to monitor vitals ,medication compliance and treatment side effects while patient is here.  Will monitor for medical issues as well as call consult as needed.  Reviewed labs-  tsh- wnl , lipid panel- wnl , hba1c- wnl , hiv - NR. EKG shows qt prolonged , but qtc - wnl. Will monitor. CSW will continue  working on disposition. Pt continues to be motivated to go to a substance abuse program. Patient to participate in therapeutic milieu .   Mahkayla Preece, MD 06/04/2016, 3:29 PM

## 2016-06-04 NOTE — Progress Notes (Signed)
D: Rohail has been labile today. Initially friendly, his mood changed quickly when told that his inhaler order had not been changed by the MD. He was told that Imitrex had been ordered for HA, but by the time it was available, he did not want it. He says he is having some thoughts of harming himself and hearing some voices but contracts for safety. He denies SI. He does not appear to be responding to internal stimuli. He has been pacing the hallway. He was upset with what Rod [place] [name] told him regarding discharge options and was then upset with what Berneice [name] told him. He refused to fill out his self inventory. A: Meds given as ordered. Pt refused offers of PRN for agitation and HA, as he did some of his a.m and noon non-psych meds (see MAR). Q15 safety checks maintained. Support/encouragement offered. R: Pt remains free from harm and continues with treatment. Will continue to monitor for needs/safety.

## 2016-06-05 MED ORDER — TRAZODONE HCL 100 MG PO TABS: 100.0000 mg | ORAL_TABLET | Freq: Every day | ORAL | Status: DC

## 2016-06-05 MED ORDER — FLUTICASONE PROPIONATE 50 MCG/ACT NA SUSP: 1.0000 | Freq: Every day | NASAL | Status: DC

## 2016-06-05 MED ORDER — LAMOTRIGINE 25 MG PO TABS: 50.0000 mg | ORAL_TABLET | Freq: Every evening | ORAL | Status: DC

## 2016-06-05 MED ORDER — ARIPIPRAZOLE 10 MG PO TABS: 10.0000 mg | ORAL_TABLET | Freq: Every day | ORAL | Status: DC

## 2016-06-05 MED ORDER — LISINOPRIL 20 MG PO TABS: 20.0000 mg | ORAL_TABLET | Freq: Every day | ORAL | Status: DC

## 2016-06-05 MED ORDER — MAGIC MOUTHWASH: 5.0000 mL | Freq: Four times a day (QID) | ORAL | Status: DC

## 2016-06-05 MED ORDER — ALBUTEROL SULFATE HFA 108 (90 BASE) MCG/ACT IN AERS: 2.0000 | INHALATION_SPRAY | RESPIRATORY_TRACT | Status: DC | PRN

## 2016-06-05 MED ORDER — HYDROCERIN EX CREA: 1.0000 "application " | TOPICAL_CREAM | Freq: Two times a day (BID) | CUTANEOUS | Status: DC

## 2016-06-05 NOTE — Progress Notes (Signed)
D: Pt  D/C home as ordered. Bus pass was given. A: All scheduled medications offered to pt as ordered. D/C instructions reviewed with pt.  Availability, emotional support, and verbal encouragement provided to pt. All belongings from locker 30 was returned to pt prior to departure. Safety maintained on Q 15 minutes checks without self harm gestures or outburst. R: Pt A & O X4. Denies SI, HI, AVH and pain when assessed. Pt only took his scheduled AM Lisinopril and Naproxen, told writer "I don't want all all that other stuff right now". Pt verbalized understanding related to d/c instructions. Pt signed his belonging sheet in agreement with items received from locker. Remains safe on and off unit till time of d/c. Pt ambulatory with steady gait, bright affect, exhibits no physical distress at time of departure.

## 2016-06-05 NOTE — [facility] Suicide Risk Assessment (Signed)
[facility] INPATIENT:  Family/Significant Other Suicide Prevention Education  Suicide Prevention Education:  Patient Refusal for Family/Significant Other Suicide Prevention Education: The patient [name] has refused to provide written consent for family/significant other to be provided Family/Significant Other Suicide Prevention Education during admission and/or prior to discharge.  Physician notified.  Daryel Gerald B 06/05/2016, 9:18 AM

## 2016-06-05 NOTE — Discharge Summary (Signed)
Physician Discharge Summary Note  Patient:  [name] is an 50 y.o., male MRN:  [id number] DOB:  [birth year] Patient phone:  [phone] (home)  Patient address:   [address] [name] [address],  Total Time spent with patient: 30 minutes  Date of Admission:  05/30/2016  Date of Discharge: 06-05-16  Reason for Admission: Worsening symptoms of Schizoaffective disorder,  Principal Problem: Schizoaffective disorder, bipolar type [name])  Discharge Diagnoses: Patient Active Problem List   Diagnosis Date Noted  . Schizoaffective disorder, bipolar type (HCC) [F25.0] 05/31/2016  . Cocaine use disorder, severe, dependence (HCC) [F14.20] 05/31/2016  . Alcohol use disorder, mild, abuse [F10.10] 05/31/2016  . Essential hypertension [I10] 05/31/2016   Past Psychiatric History: Schizoaffective disorder, Bipolar-type, Cocaine & alcohol dependence  Past Medical History:  Past Medical History  Diagnosis Date  . Hypertension   . Depression   . Asthma    History reviewed. No pertinent past surgical history.  Family History:  Family History  Problem Relation Age of Onset  . Drug abuse Mother   . Suicidality Mother    Family Psychiatric  History: See H&P  Social History:  History  Alcohol Use  . Yes    Comment: infrequently     History  Drug Use  . Yes  . Special: Cocaine    Social History   Social History  . Marital Status: Single    Spouse Name: N/A  . Number of Children: N/A  . Years of Education: N/A   Social History Main Topics  . Smoking status: Current Every Day Smoker -- 1.00 packs/day    Types: Cigarettes  . Smokeless tobacco: None  . Alcohol Use: Yes     Comment: infrequently  . Drug Use: Yes    Special: Cocaine  . Sexual Activity: Not Asked   Other Topics Concern  . None   Social History Narrative   Hospital Course: [name] is a 50 y.o. AA male, who is single, on SSD, who presented reporting symptoms of depression and suicidal  ideation with a plan to jump off a building.   [name] was admitted to the [name] adult unit for Suicidal ideations with plans to jump off of a building related to worsening symptoms of depression. His BAL on admission was 7, however, [name] was not presenting with any substance withdrawal symptoms. He did not receive any detoxification. However, he was in need of mood stabilization treatments. During his admission assessment, [name] was evaluated and his symptoms were identified. The medication management was discussed and initiated targeting those presenting symptoms. He was oriented to the unit and encouraged to participate in the unit programming. His other pre-existing medical problems were identified and treated appropriately by resuming his pertinent home medication for those health issues including treatment for dry skin with Eucerin cream.         While a patient in this hospital, [name] was evaluated each day by a clinical provider to ascertain his response to his treatment regimen. As the day goes by, improvement was noted by [name]'s report of decreasing symptoms, improved mood, presentation of good affect, medication tolerance, behavior & participation in the unit programming.  He was required on daily basis to complete a self inventory asssessment noting mood, mental status, pain, new symptoms, anxiety and concerns. His symptoms responded well to his treatment regimen. Also being in a therapeutic & supportive environment assisted in his mood stability.   On this day of his hospital discharge, [name] was in much improved  condition than upon admission. His symptoms were reported as significantly decreased or resolved completely. Upon discharge, he denies SI/HI and voiced no AVH. He was motivated to continue taking medication with a goal of continued improvement in mental health. [name] was discharged home with a plan to follow up as noted below. His medications at discharge include; Abilify 10 mg for mood  control, Lamictal 25 mg for mood stabilization & Trazodone 100 mg for insomnia. He received other medication regimen for the other medical issues presented. He tolerated his treatment regimen without any adverse effects or reactions.  Physical Findings: AIMS: Facial and Oral Movements Muscles of Facial Expression: None, normal Lips and Perioral Area: None, normal Jaw: None, normal Tongue: None, normal,Extremity Movements Upper (arms, wrists, hands, fingers): None, normal Lower (legs, knees, ankles, toes): None, normal, Trunk Movements Neck, shoulders, hips: None, normal, Overall Severity Severity of abnormal movements (highest score from questions above): None, normal Incapacitation due to abnormal movements: None, normal Patient's awareness of abnormal movements (rate only patient's report): No Awareness, Dental Status Current problems with teeth and/or dentures?: No Does patient usually wear dentures?: No  CIWA:  CIWA-Ar Total: 0 COWS:  COWS Total Score: 0  Musculoskeletal: Strength & Muscle Tone: within normal limits Gait & Station: normal Patient leans: N/A  Psychiatric Specialty Exam: Physical Exam  Constitutional: He appears well-developed and well-nourished.  HENT:  Head: Normocephalic.  Eyes: Pupils are equal, round, and reactive to light.  Neck: Normal range of motion.  Cardiovascular: Normal rate.   Respiratory: Effort normal.  GI: Soft.  Genitourinary:  Denies any issues in this area  Musculoskeletal: Normal range of motion.  Neurological: He is alert.  Skin: Skin is warm and dry.    Review of Systems  Constitutional: Negative.   HENT: Negative.   Eyes: Negative.   Respiratory: Negative.   Cardiovascular: Negative.   Gastrointestinal: Negative.   Genitourinary: Negative.   Musculoskeletal: Negative.   Skin: Negative.   Neurological: Negative.   Endo/Heme/Allergies: Negative.   Psychiatric/Behavioral: Positive for depression (Stable) and substance abuse  (Hx. Cocaine & alcohol dependence). Negative for suicidal ideas, hallucinations and memory loss. The patient has insomnia (Stable). The patient is not nervous/anxious.     Blood pressure 109/64, pulse 93, temperature 97.7 F (36.5 C), temperature source Oral, resp. rate 18, height 5\' 9"  (1.753 m), weight 81.647 kg (180 lb), SpO2 100 %.Body mass index is 26.57 kg/(m^2).  See H&P   Have you used any form of tobacco in the last 30 days? (Cigarettes, Smokeless Tobacco, Cigars, and/or Pipes): Yes  Has this patient used any form of tobacco in the last 30 days? (Cigarettes, Smokeless Tobacco, Cigars, and/or Pipes) Yes, No  Blood Alcohol level:  Lab Results  Component Value Date   ETH 7* 05/29/2016   ETH 28* 12/01/2011   Metabolic Disorder Labs:  Lab Results  Component Value Date   HGBA1C 5.7* 06/02/2016   MPG 117 06/02/2016   Lab Results  Component Value Date   PROLACTIN 9.6 06/02/2016   Lab Results  Component Value Date   CHOL 194 06/02/2016   TRIG 52 06/02/2016   HDL 103 06/02/2016   CHOLHDL 1.9 06/02/2016   VLDL 10 06/02/2016   LDLCALC 81 06/02/2016   See Psychiatric Specialty Exam and Suicide Risk Assessment completed by Attending Physician prior to discharge.  Discharge destination:  Home  Is patient on multiple antipsychotic therapies at discharge:  No   Has Patient had three or more failed trials of antipsychotic monotherapy  by history:  No  Recommended Plan for Multiple Antipsychotic Therapies: NA    Medication List    TAKE these medications      Indication   albuterol 108 (90 Base) MCG/ACT inhaler  Commonly known as:  PROVENTIL HFA;VENTOLIN HFA  Inhale 2 puffs into the lungs every 4 (four) hours as needed for wheezing or shortness of breath.   Indication:  Asthma     ARIPiprazole 10 MG tablet  Commonly known as:  ABILIFY  Take 1 tablet (10 mg total) by mouth at bedtime. For mood control   Indication:  Mood control     fluticasone 50 MCG/ACT nasal spray   Commonly known as:  FLONASE  Place 1 spray into both nostrils daily. For allergies   Indication:  Allergic Rhinitis     hydrocerin Crea  Apply 1 application topically 2 (two) times daily. For dry skin   Indication:  Dry skin     lamoTRIgine 25 MG tablet  Commonly known as:  LAMICTAL  Take 2 tablets (50 mg total) by mouth every evening. For mood stabilization   Indication:  Mood stabilization     lisinopril 20 MG tablet  Commonly known as:  PRINIVIL,ZESTRIL  Take 1 tablet (20 mg total) by mouth daily. For high blood pressure   Indication:  High Blood Pressure     magic mouthwash Soln  Take 5 mLs by mouth 4 (four) times daily. For oral thrush   Indication:  Oral thrush     traZODone 100 MG tablet  Commonly known as:  DESYREL  Take 1 tablet (100 mg total) by mouth at bedtime. For sleep   Indication:  Trouble Sleeping       Follow-up Information    Follow up with [name].   Specialty:  Behavioral Health   Why:  Go to the walk-in clinic M-F between 8 and 10AM for your hospital follow up appointment   Contact information:   [address] [name] [address] [phone]      Follow-up recommendations:  Activity:  As tolerated Diet: As recommended by your primary care doctor. Keep all scheduled follow-up appointments as recommended.  Comments: Patient is instructed prior to discharge to: Take all medications as prescribed by his/her mental healthcare provider. Report any adverse effects and or reactions from the medicines to his/her outpatient provider promptly. Patient has been instructed & cautioned: To not engage in alcohol and or illegal drug use while on prescription medicines. In the event of worsening symptoms, patient is instructed to call the crisis hotline, 911 and or go to the nearest ED for appropriate evaluation and treatment of symptoms. To follow-up with his/her primary care provider for your other medical issues, concerns and or health care needs.    Signed: [name], NP, PMHNP, FNP-BC 06/05/2016, 3:35 PM

## 2016-06-05 NOTE — Progress Notes (Signed)
  [facility] Management Discharge Plan :  Will you be returning to the same living situation after discharge:  No. At discharge, do you have transportation home?: Yes,  bus pass Do you have the ability to pay for your medications: Yes,  MCD  Release of information consent forms completed and in the chart;  Patient's signature needed at discharge.  Patient to Follow up at: Follow-up Information    Follow up with [facility].   Specialty:  Behavioral Health   Why:  Go to the walk-in clinic M-F between 8 and 10AM for your hospital follow up appointment   Contact information:   [address] [name] [address] [phone]       Next level of care provider has access to [facility] Link:no  Safety Planning and Suicide Prevention discussed: Yes,  yes  Have you used any form of tobacco in the last 30 days? (Cigarettes, Smokeless Tobacco, Cigars, and/or Pipes): Yes  Has patient been referred to the Quitline?: Patient refused referral  Patient has been referred for addiction treatment: Yes  [name] 06/05/2016, 9:17 AM

## 2016-06-05 NOTE — [facility] Suicide Risk Assessment (Signed)
[facility] Discharge Suicide Risk Assessment   Principal Problem: Schizoaffective disorder, bipolar type [facility]) Discharge Diagnoses:  Patient Active Problem List   Diagnosis Date Noted  . Schizoaffective disorder, bipolar type (HCC) [F25.0] 05/31/2016  . Cocaine use disorder, severe, dependence (HCC) [F14.20] 05/31/2016  . Alcohol use disorder, mild, abuse [F10.10] 05/31/2016  . Essential hypertension [I10] 05/31/2016    Total Time spent with patient: 30 minutes  Musculoskeletal: Strength & Muscle Tone: within normal limits Gait & Station: normal Patient leans: N/A  Psychiatric Specialty Exam: Review of Systems  All other systems reviewed and are negative.   Blood pressure 109/64, pulse 93, temperature 97.7 F (36.5 C), temperature source Oral, resp. rate 18, height 5\' 9"  (1.753 m), weight 81.647 kg (180 lb), SpO2 100 %.Body mass index is 26.57 kg/(m^2).  General Appearance: Fairly Groomed  Patent attorney::  Minimal  Speech:  Normal Rate409  Volume:  varies - pt is angry states he does not think he will benefit from anymore treatment  Mood:  states "he is fine " , but appears angry  Affect:  Congruent  Thought Process:  Goal Directed and Descriptions of Associations: Intact  Orientation:  Full (Time, Place, and Person)  Thought Content:  Logical  Suicidal Thoughts:  No  Homicidal Thoughts:  did not express any  Memory:  Immediate;   Fair Recent;   Fair Remote;   Good  Judgement:  Fair  Insight:  Shallow  Psychomotor Activity:  Normal  Concentration:  Fair  Recall:  [employer] of Knowledge:Fair  Language: Fair  Akathisia:  No  Handed:  Right  AIMS (if indicated):     Assets:  Others:  access to health care, physical health  Sleep:  Number of Hours: 4.25  Cognition: WNL  ADL's:  Intact   Mental Status Per Nursing Assessment::   On Admission:  Suicidal ideation indicated by patient  Demographic Factors:  Male  Loss Factors: NA  Historical  Factors: Impulsivity  Risk Reduction Factors:   aftercare arranged  Continued Clinical Symptoms:  Alcohol/Substance Abuse/Dependencies Previous Psychiatric Diagnoses and Treatments  Cognitive Features That Contribute To Risk:  None    Suicide Risk:  Minimal: No identifiable suicidal ideation.  Patients presenting with no risk factors but with morbid ruminations; may be classified as minimal risk based on the severity of the depressive symptoms  Follow-up Information    Follow up with [facility].   Specialty:  Behavioral Health   Why:  Go to the walk-in clinic M-F between 8 and 10AM for your hospital follow up appointment   Contact information:   [address] [name] [address] [phone]       Plan Of Care/Follow-up recommendations:  Activity:  no restrictions Diet:  regular Tests:  as needed Other:  follow up with aftercare  Diyana Starrett, MD 06/05/2016, 9:19 AM

## 2016-06-05 NOTE — Tx Team (Signed)
Interdisciplinary Treatment Plan Update (Adult)  Date:  06/05/2016   Time Reviewed:  8:31 AM   Progress in Treatment: Attending groups: Yes. Participating in groups:  Yes. Taking medication as prescribed:  Yes. Tolerating medication:  Yes. Family/Significant other contact made:  No Patient understands diagnosis:  Yes  As evidenced by seeking help with getting into a structured living situation Discussing patient identified problems/goals with staff:  Yes, see initial care plan. Medical problems stabilized or resolved:  Yes. Denies suicidal/homicidal ideation: Yes. Issues/concerns per patient self-inventory:  No. Other:  New problem(s) identified:  Discharge Plan or Barriers: see below  Reason for Continuation of Hospitalization:   Comments:  Pt appeared as very angry and irritable , with mood lability . Pt was seen as getting mad and loud several times during the evaluation. Pt required a lot of redirection. Pt reported several stressors , not being on the right medications , having a drug addiction and getting help with it . Pt reports he has tried seroquel in the past which made him very groggy. Pt reports AH - asking him to kill self. Pt confirmed above hx - see above - has depressive sx as well as has SI with plan. Pt reports he abuses cocaine daily - several grams - unable to give exact quantity - has been abusing it for a very long time. Will start Abilify 5 mg po qhs for psychosis/mood sx. Plan to DC pt on Abilify Maintena IM 400 mg - if he tolerates the po. Will add Lamictal 25 mg po daily for mood sx. Will restart home medications. Will add Magic mouth wash for oral sore. Will add PRN medications as per agitation protocol.  Estimated length of stay: D/C today  New goal(s):  Review of initial/current patient goals per problem list:   Review of initial/current patient goals per problem list:  1. Goal(s): Patient will participate in aftercare plan   Met: Yes    Target date: 3-5 days post admission date   As evidenced by: Patient will participate within aftercare plan AEB aftercare provider and housing plan at discharge being identified. 06/02/16:  States he plans to go to [employer] 06/05/16:  Did not like the program at [facility] based on phone interview.  Did not like the program at [facility] based on phone interview.  States he will go to [facility] to try to secure a bed and follow up [facility]   2. Goal (s): Patient will exhibit decreased depressive symptoms and suicidal ideations.   Met: Yes   Target date: 3-5 days post admission date   As evidenced by: Patient will utilize self rating of depression at 3 or below and demonstrate decreased signs of depression or be deemed stable for discharge by MD. 06/02/16:  Denies SI; rates depression a 6 today 06/05/16:  Denies depression today      5. Goal(s): Patient will demonstrate decreased signs of psychosis  * Met: Yes  * Target date: 3-5 days post admission date  * As evidenced by: Patient will demonstrate decreased frequency of AVH or return to baseline function 06/02/16:  C/O AH, VH 06/05/16:  No signs nor symptoms of psychosis today     6. Goal (s): Patient will demonstrate decreased signs of mania  * Met: Yes  * Target date: 3-5 days post admission date  * As evidenced by: Patient demonstrate decreased signs of mania AEB decreased mood instability and return to baseline functioning 06/02/16:  At admission, and as recently as  yesterday, pt was irritable, angry refusing to discuss details of needs and plan 06/05/16:  Continues to be irritable, but only because the plan he was hoping for did not come together      Attendees: Patient:  06/05/2016 8:31 AM   Family:   06/05/2016 8:31 AM   Physician:  Ursula Alert, MD 06/05/2016 8:31 AM   Nursing:   Hedy Jacob, RN 06/05/2016 8:31 AM   CSW:    Roque Lias, [name]   06/05/2016 8:31 AM   Other:  06/05/2016 8:31 AM   Other:    06/05/2016 8:31 AM   Other:  Lars Pinks, Nurse CM 06/05/2016 8:31 AM   Other:   06/05/2016 8:31 AM   Other:  Norberto Sorenson, [place]  06/05/2016 8:31 AM   Other:  06/05/2016 8:31 AM   Other:  06/05/2016 8:31 AM   Other:  06/05/2016 8:31 AM   Other:  06/05/2016 8:31 AM   Other:  06/05/2016 8:31 AM   Other:   06/05/2016 8:31 AM    Scribe for Treatment Team:   Trish Mage, 06/05/2016 8:31 AM

## 2016-06-06 LAB — RPR: RPR: NONREACTIVE

## 2016-06-12 DIAGNOSIS — Z5900 Homelessness unspecified: Secondary | ICD-10-CM

## 2016-07-04 NOTE — Congregational Nurse Program (Signed)
Congregational Nurse Program Note  Date of Encounter: 06/12/2016  Past Medical History: Past Medical History:  Diagnosis Date  . Asthma   . Depression   . Hypertension     Encounter Details:     CNP Questionnaire - 07/04/16 1623      Patient Demographics   Is this a new or existing patient? New   Patient is considered a/an Not Applicable   Race African-American/Black     Patient Assistance   Location of Patient Assistance Not Applicable   Patient's financial/insurance status Medicare   Uninsured Patient No   Patient referred to apply for the following financial assistance Not Applicable   Food insecurities addressed Provided food supplies   Transportation assistance No   Assistance securing medications No   Educational health offerings Medications;Navigating the healthcare system     Encounter Details   Primary purpose of visit Chronic Illness/Condition Visit;Education/Health Concerns;Navigating the Healthcare System;Post ED/Hospitalization Visit   Was an Emergency Department visit averted? Not Applicable   Does patient have a medical provider? Yes   Patient referred to Follow up with established PCP   Was a mental health screening completed? (GAINS tool) No   Does patient have dental issues? No   Does patient have vision issues? No   Does your patient have an abnormal blood pressure today? No   Since previous encounter, have you referred patient for abnormal blood pressure that resulted in a new diagnosis or medication change? No   Does your patient have an abnormal blood glucose today? No   Since previous encounter, have you referred patient for abnormal blood glucose that resulted in a new diagnosis or medication change? No   Was there a life-saving intervention made? No      Client reports he is running out of his medications.  Does not have refills.  Discussed with him about needing to see his provider listed on his medicaid card.

## 2016-07-09 ENCOUNTER — Encounter (HOSPITAL_COMMUNITY): Payer: Self-pay | Admitting: Emergency Medicine

## 2016-07-09 ENCOUNTER — Emergency Department (HOSPITAL_COMMUNITY)
Admission: EM | Admit: 2016-07-09 | Discharge: 2016-07-09 | Disposition: A | Discharge status: Home / Self Care | Payer: Medicaid Other | Attending: Emergency Medicine | Admitting: Emergency Medicine

## 2016-07-09 ENCOUNTER — Emergency Department (HOSPITAL_COMMUNITY): Payer: Medicaid Other

## 2016-07-09 DIAGNOSIS — R079 Chest pain, unspecified: Secondary | ICD-10-CM | POA: Diagnosis present

## 2016-07-09 DIAGNOSIS — J45901 Unspecified asthma with (acute) exacerbation: Secondary | ICD-10-CM | POA: Insufficient documentation

## 2016-07-09 DIAGNOSIS — F17210 Nicotine dependence, cigarettes, uncomplicated: Secondary | ICD-10-CM | POA: Diagnosis not present

## 2016-07-09 DIAGNOSIS — R059 Cough, unspecified: Secondary | ICD-10-CM

## 2016-07-09 DIAGNOSIS — Z79899 Other long term (current) drug therapy: Secondary | ICD-10-CM | POA: Insufficient documentation

## 2016-07-09 DIAGNOSIS — R05 Cough: Secondary | ICD-10-CM | POA: Diagnosis not present

## 2016-07-09 DIAGNOSIS — I10 Essential (primary) hypertension: Secondary | ICD-10-CM | POA: Insufficient documentation

## 2016-07-09 DIAGNOSIS — J208 Acute bronchitis due to other specified organisms: Secondary | ICD-10-CM

## 2016-07-09 DIAGNOSIS — K0889 Other specified disorders of teeth and supporting structures: Secondary | ICD-10-CM | POA: Diagnosis not present

## 2016-07-09 HISTORY — DX: Homelessness: Z59.0

## 2016-07-09 HISTORY — DX: Homelessness unspecified: Z59.00

## 2016-07-09 LAB — CBC WITH DIFFERENTIAL/PLATELET
BASOS ABS: 0.1 10*3/uL (ref 0.0–0.1)
BASOS PCT: 1 %
EOS ABS: 0.1 10*3/uL (ref 0.0–0.7)
Eosinophils Relative: 1 %
HCT: 42.9 % (ref 39.0–52.0)
HEMOGLOBIN: 14.5 g/dL (ref 13.0–17.0)
LYMPHS ABS: 2 10*3/uL (ref 0.7–4.0)
Lymphocytes Relative: 28 %
MCH: 33.4 pg (ref 26.0–34.0)
MCHC: 33.8 g/dL (ref 30.0–36.0)
MCV: 98.8 fL (ref 78.0–100.0)
Monocytes Absolute: 0.7 10*3/uL (ref 0.1–1.0)
Monocytes Relative: 10 %
NEUTROS PCT: 60 %
Neutro Abs: 4.2 10*3/uL (ref 1.7–7.7)
PLATELETS: 210 10*3/uL (ref 150–400)
RBC: 4.34 MIL/uL (ref 4.22–5.81)
RDW: 14.1 % (ref 11.5–15.5)
WBC: 7.1 10*3/uL (ref 4.0–10.5)

## 2016-07-09 LAB — COMPREHENSIVE METABOLIC PANEL
ALT: 20 U/L (ref 17–63)
AST: 30 U/L (ref 15–41)
Albumin: 3.7 g/dL (ref 3.5–5.0)
Alkaline Phosphatase: 62 U/L (ref 38–126)
Anion gap: 8 (ref 5–15)
BUN: 16 mg/dL (ref 6–20)
CHLORIDE: 103 mmol/L (ref 101–111)
CO2: 27 mmol/L (ref 22–32)
Calcium: 9.1 mg/dL (ref 8.9–10.3)
Creatinine, Ser: 0.98 mg/dL (ref 0.61–1.24)
GFR calc Af Amer: >60 mL/min (ref >60)
Glucose, Bld: 82 mg/dL (ref 65–99)
POTASSIUM: 3.8 mmol/L (ref 3.5–5.1)
SODIUM: 138 mmol/L (ref 135–145)
Total Bilirubin: 0.8 mg/dL (ref 0.3–1.2)
Total Protein: 6.7 g/dL (ref 6.5–8.1)

## 2016-07-09 LAB — I-STAT TROPONIN, ED: TROPONIN I, POC: 0.01 ng/mL (ref 0.00–0.08)

## 2016-07-09 IMAGING — CR DG CHEST 2V
2 series · 2 of 2 positions shown · non-contrast
Comparison: None.

CLINICAL DATA: Acute onset of generalized chest pain. Initial
encounter.

EXAM:
CHEST  2 VIEW

[chest pa]
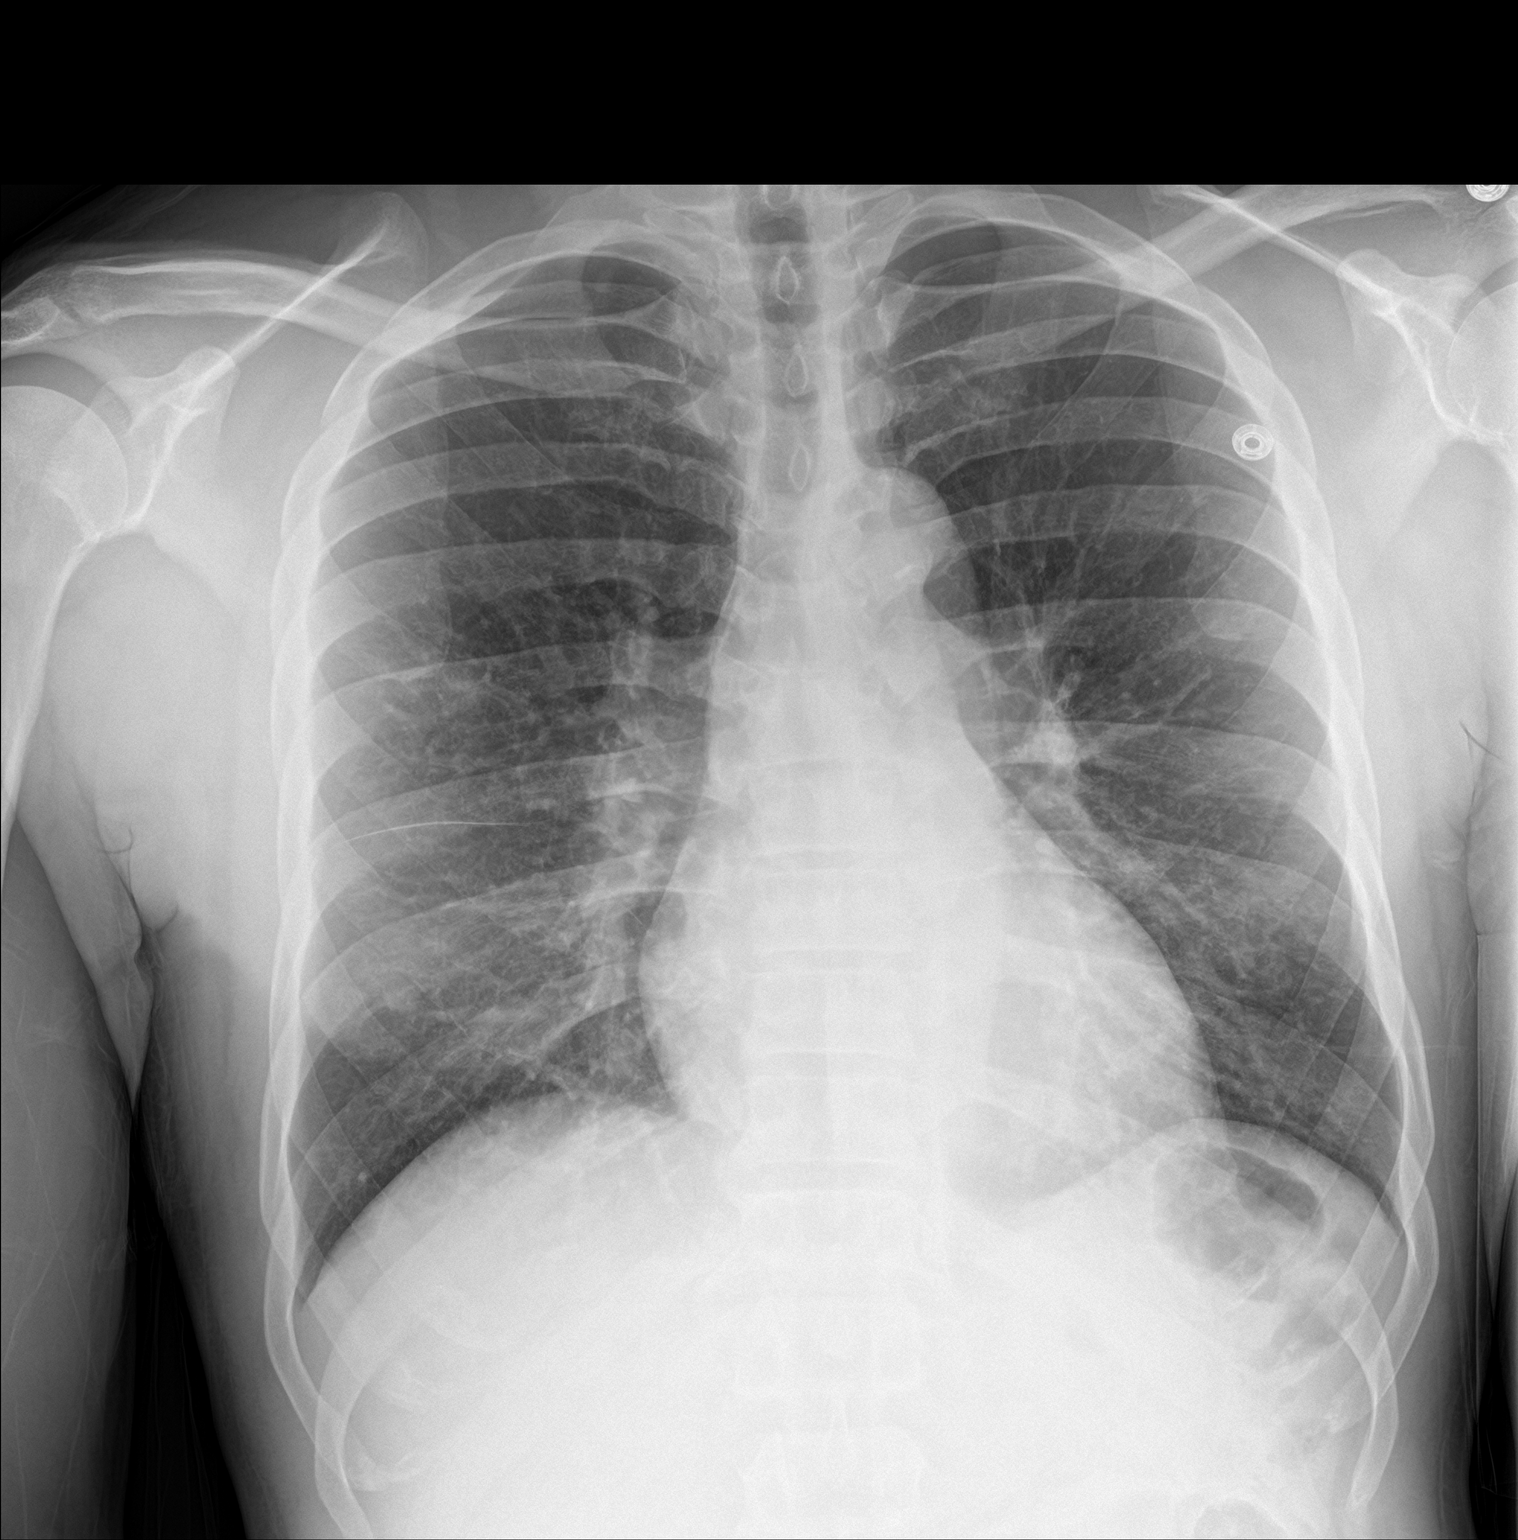

[chest lat]
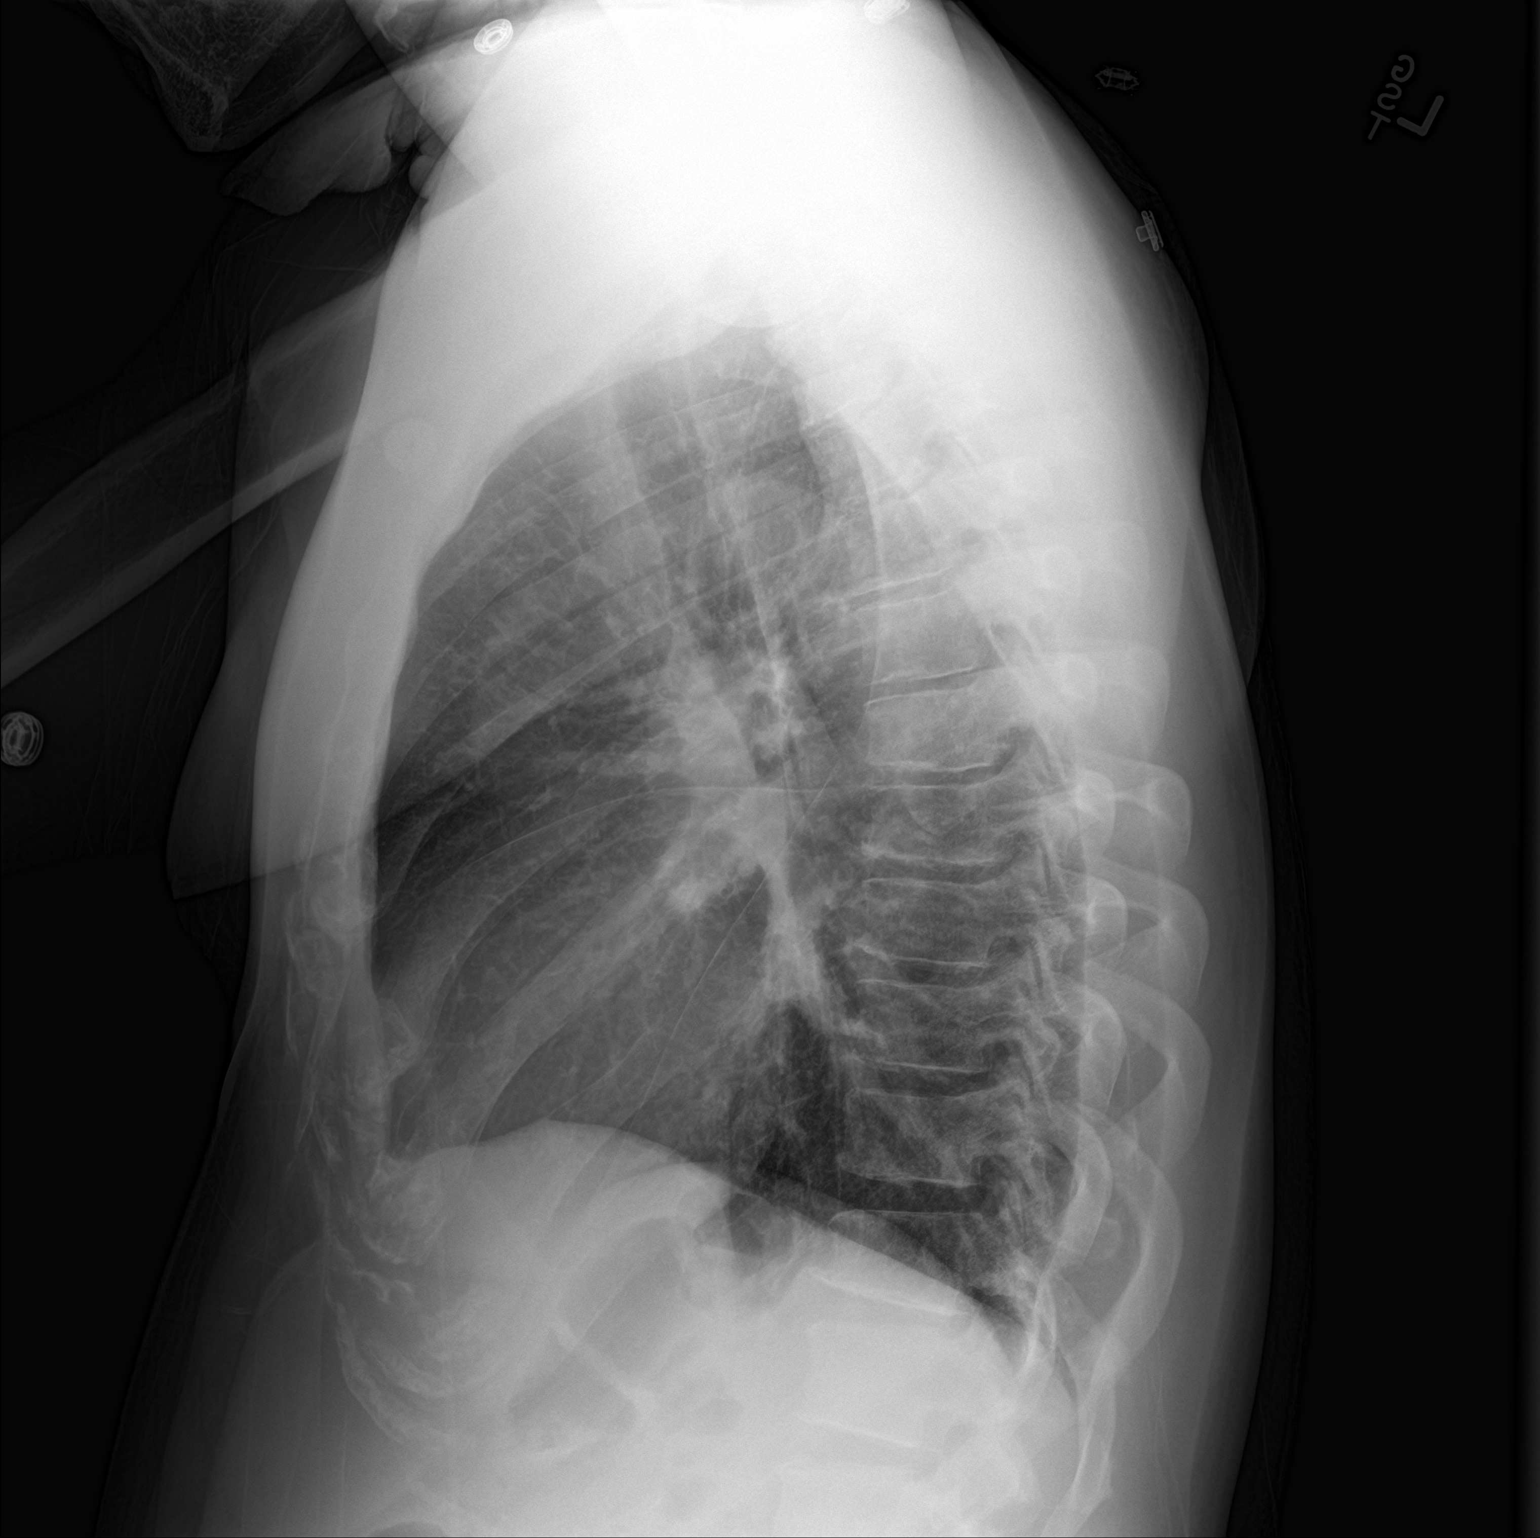

[2 of 2 positions shown; findings below may reference images not displayed]

FINDINGS: The lungs are well-aerated. Minimal bilateral atelectasis is noted.
There is no evidence of focal opacification, pleural effusion or
pneumothorax. Bilateral nipple shadows are seen.

The heart is normal in size; the mediastinal contour is within
normal limits. No acute osseous abnormalities are seen.
IMPRESSION: Minimal bilateral atelectasis noted.  Lungs otherwise clear.

## 2016-07-09 MED ORDER — GI COCKTAIL ~~LOC~~
30.0000 mL | Freq: Once | ORAL | Status: AC
  Administered 2016-07-09: 30 mL via ORAL
  Filled 2016-07-09: qty 30

## 2016-07-09 MED ORDER — KETOROLAC TROMETHAMINE 30 MG/ML IJ SOLN
30.0000 mg | Freq: Once | INTRAMUSCULAR | Status: AC
  Administered 2016-07-09: 30 mg via INTRAVENOUS
  Filled 2016-07-09: qty 1

## 2016-07-09 MED ORDER — PREDNISONE 10 MG PO TABS: 40.0000 mg | ORAL_TABLET | Freq: Every day | ORAL | 0 refills | Status: AC

## 2016-07-09 MED ORDER — AMOXICILLIN-POT CLAVULANATE 875-125 MG PO TABS: 1.0000 | ORAL_TABLET | Freq: Two times a day (BID) | ORAL | 0 refills | Status: AC

## 2016-07-09 MED ORDER — PREDNISONE 20 MG PO TABS
60.0000 mg | ORAL_TABLET | Freq: Once | ORAL | Status: AC
  Administered 2016-07-09: 60 mg via ORAL
  Filled 2016-07-09: qty 3

## 2016-07-09 MED ORDER — IPRATROPIUM-ALBUTEROL 0.5-2.5 (3) MG/3ML IN SOLN
3.0000 mL | Freq: Once | RESPIRATORY_TRACT | Status: AC
  Administered 2016-07-09: 3 mL via RESPIRATORY_TRACT
  Filled 2016-07-09: qty 3

## 2016-07-09 MED ORDER — CYCLOBENZAPRINE HCL 10 MG PO TABS: 10.0000 mg | ORAL_TABLET | Freq: Two times a day (BID) | ORAL | 0 refills | Status: DC | PRN

## 2016-07-09 MED ORDER — CYCLOBENZAPRINE HCL 10 MG PO TABS
10.0000 mg | ORAL_TABLET | Freq: Once | ORAL | Status: AC
  Administered 2016-07-09: 10 mg via ORAL
  Filled 2016-07-09: qty 1

## 2016-07-09 NOTE — ED Notes (Signed)
The pt refused to allow this RN to go over discharge instructions. Pt refused to sign for discharge instructions. Pt did however take all discharge instructions, resources, and prescriptions.

## 2016-07-09 NOTE — ED Triage Notes (Addendum)
Pt. reports central chest pain with SOB , productive cough , mild nausea and headache onset this week . EMS personnel reported that pt. refused EKG.

## 2016-07-09 NOTE — ED Notes (Addendum)
Pt. refused EKG and also refused to be placed on a monitor / vital signs.[name]

## 2016-07-09 NOTE — ED Notes (Signed)
Patient has refused EKG 3 times, despite explaining risks.

## 2016-07-09 NOTE — ED Provider Notes (Signed)
[facility]EMERGENCY DEPT Provider Note   CSN: [id number] Arrival date & time: 07/09/16  9562  First Provider Contact:  None       History   Chief Complaint Chief Complaint  Patient presents with  . Chest Pain    [name] is a 50 y.o. male.  HPI  When cough burns, stings, coughing up mucus, feel like throat has mucus, haven't been able to eat because of tooth pain, Coughing, everything hurts, abd pain, tried ibuprofen  which is not helping. Pain severe throbbing in tooth. 4-5 days of coughing, yellow-green mucous.  Shortness of breath, hard to catch breath, wheezing like asthma, feels like inhaler is not helping any more thought.   Past Medical History:  Diagnosis Date  . Asthma   . Depression   . Homelessness   . Hypertension     Patient Active Problem List   Diagnosis Date Noted  . Schizoaffective disorder, bipolar type (HCC) 05/31/2016  . Cocaine use disorder, severe, dependence (HCC) 05/31/2016  . Alcohol use disorder, mild, abuse 05/31/2016  . Essential hypertension 05/31/2016    History reviewed. No pertinent surgical history.     Home Medications    Prior to Admission medications   Medication Sig Start Date End Date Taking? Authorizing Provider  albuterol (PROVENTIL HFA;VENTOLIN HFA) 108 (90 Base) MCG/ACT inhaler Inhale 2 puffs into the lungs every 4 (four) hours as needed for wheezing or shortness of breath. 06/05/16   [name], NP  amoxicillin-clavulanate (AUGMENTIN) 875-125 MG tablet Take 1 tablet by mouth every 12 (twelve) hours. 07/09/16 07/16/16  [name], MD  ARIPiprazole (ABILIFY) 10 MG tablet Take 1 tablet (10 mg total) by mouth at bedtime. For mood control 06/05/16   [name], NP  cyclobenzaprine (FLEXERIL) 10 MG tablet Take 1 tablet (10 mg total) by mouth 2 (two) times daily as needed for muscle spasms. 07/09/16   [name], MD  fluticasone (FLONASE) 50 MCG/ACT nasal spray Place 1 spray into both nostrils daily. For  allergies 06/05/16   [name], NP  hydrocerin (EUCERIN) CREA Apply 1 application topically 2 (two) times daily. For dry skin 06/05/16   [name], NP  lamoTRIgine (LAMICTAL) 25 MG tablet Take 2 tablets (50 mg total) by mouth every evening. For mood stabilization 06/05/16   [name], NP  lisinopril (PRINIVIL,ZESTRIL) 20 MG tablet Take 1 tablet (20 mg total) by mouth daily. For high blood pressure 06/05/16   [name], NP  magic mouthwash SOLN Take 5 mLs by mouth 4 (four) times daily. For oral thrush 06/05/16   [name], NP  predniSONE (DELTASONE) 10 MG tablet Take 4 tablets (40 mg total) by mouth daily. 07/09/16 07/13/16  [name], MD  traZODone (DESYREL) 100 MG tablet Take 1 tablet (100 mg total) by mouth at bedtime. For sleep 06/05/16   [name], NP    Family History Family History  Problem Relation Age of Onset  . Drug abuse Mother   . Suicidality Mother     Social History Social History  Substance Use Topics  . Smoking status: Current Every Day Smoker    Types: Cigarettes  . Smokeless tobacco: Not on file  . Alcohol use Yes     Comment: infrequently     Allergies   Tylenol [acetaminophen]   Review of Systems Review of Systems  Constitutional: Negative for fever (not sure).  HENT: Positive for dental problem. Negative for sore throat.   Eyes:  Negative for visual disturbance.  Respiratory: Positive for cough, shortness of breath and wheezing.   Cardiovascular: Negative for chest pain.  Gastrointestinal: Positive for abdominal pain. Negative for nausea and vomiting.  Genitourinary: Negative for difficulty urinating.  Musculoskeletal: Negative for back pain and neck stiffness.  Skin: Negative for rash.  Neurological: Positive for headaches. Negative for syncope.     Physical Exam Updated Vital Signs BP 187/100 (BP Location: Left Arm)   Pulse 62   Temp 98.4 F (36.9 C) (Oral)   Resp 17   SpO2 100%   Physical Exam  Constitutional: He is  oriented to person, place, and time. He appears well-developed and well-nourished. No distress.  HENT:  Head: Normocephalic and atraumatic.  Mouth/Throat: Oropharynx is clear and moist.  Dental caries 1st molar, tenderness, no sign of abscess  Eyes: Conjunctivae and EOM are normal.  Neck: Normal range of motion.  Cardiovascular: Normal rate, regular rhythm, normal heart sounds and intact distal pulses.  Exam reveals no gallop and no friction rub.   No murmur heard. Pulmonary/Chest: Effort normal. No respiratory distress. He has wheezes. He has no rales. He exhibits tenderness.  Abdominal: Soft. He exhibits no distension. There is no tenderness. There is no guarding.  Musculoskeletal: He exhibits no edema.  Neurological: He is alert and oriented to person, place, and time.  Skin: Skin is warm and dry. He is not diaphoretic.  Nursing note and vitals reviewed.    ED Treatments / Results  Labs (all labs ordered are listed, but only abnormal results are displayed) Labs Reviewed  CBC WITH DIFFERENTIAL/PLATELET  COMPREHENSIVE METABOLIC PANEL  I-STAT TROPOININ, ED    EKG  EKG Interpretation  Date/Time:  Wednesday July 09 2016 06:16:00 EDT Ventricular Rate:  65 PR Interval:    QRS Duration: 90 QT Interval:  477 QTC Calculation: 496 R Axis:   10 Text Interpretation:  Sinus rhythm Probable left atrial enlargement Left ventricular hypertrophy Abnormal TW lateral leads present previously and is nonspecific Other nonspecific TW changes in comparison to prior, TW inversions in inferior leads no longer present, however new Q wave  lead III in comparison to 06/01/2016 Confirmed by [facility] MD, Kaelum Kissick ([id number]) on 07/09/2016 2:55:28 PM       Radiology Dg Chest 2 View  Result Date: 07/09/2016 CLINICAL DATA:  Acute onset of generalized chest pain. Initial encounter. EXAM: CHEST  2 VIEW COMPARISON:  None. FINDINGS: The lungs are well-aerated. Minimal bilateral atelectasis is noted. There is no  evidence of focal opacification, pleural effusion or pneumothorax. Bilateral nipple shadows are seen. The heart is normal in size; the mediastinal contour is within normal limits. No acute osseous abnormalities are seen. IMPRESSION: Minimal bilateral atelectasis noted.  Lungs otherwise clear. Electronically Signed   By: Roanna Raider M.D.   On: 07/09/2016 05:32    Procedures .Nerve Block Date/Time: 07/09/2016 3:06 PM Performed by: [name] Authorized by: [name]   Consent:    Consent obtained:  Verbal   Risks discussed:  Allergic reaction, infection, bleeding, intravenous injection, pain and nerve damage   Alternatives discussed:  Alternative treatment Indications:    Indications:  Pain relief Location:    Body area:  Head   Head nerve blocked: inferior alveolar.   Laterality:  Right Procedure details (see MAR for exact dosages):    Anesthetic injected:  Bupivacaine 0.25% WITH epi   Injection procedure:  Anatomic landmarks identified Post-procedure details:    Outcome:  Pain relieved   Patient tolerance of procedure:  Tolerated well, no immediate complications   (including critical care time)  Medications Ordered in ED Medications  ketorolac (TORADOL) 30 MG/ML injection 30 mg (30 mg Intravenous Given 07/09/16 0632)  gi cocktail (Maalox,Lidocaine,Donnatal) (30 mLs Oral Given 07/09/16 0932)  ipratropium-albuterol (DUONEB) 0.5-2.5 (3) MG/3ML nebulizer solution 3 mL (3 mLs Nebulization Given 07/09/16 6712)  predniSONE (DELTASONE) tablet 60 mg (60 mg Oral Given 07/09/16 4580)  cyclobenzaprine (FLEXERIL) tablet 10 mg (10 mg Oral Given 07/09/16 9983)     Initial Impression / Assessment and Plan / ED Course  I have reviewed the triage vital signs and the nursing notes.  Pertinent labs & imaging results that were available during my care of the patient were reviewed by me and considered in my medical decision making (see chart for details).  Clinical Course   50yo male with  history of asthma, depression, htn, schizoaffective disorder, cocaine use presents with concern for cough, increased wheezing, body aches, chest pain with coughing and dental pain.  Troponin negative, and ECG with nonspecific changes.  By hx of pain with palpation, cough, suspect musculoskeletal pain from coughing.  Pt PERC negative and doubt PE.  CXR without pneumonia. Pt with occasional wheezing and given symptoms described, suspect likely asthma exacerbation in setting of viral bronchitis. Gave duoneb with improvement and rx for prednisone.  Pt also reports body aches, also likely viral. Abd exam benign. Gave rx for flexeril for body aches.   Also concerned regarding dental pain. No sign of ludwig's angina, no drainable abscess.  Dental block performed with improvement of pain, pt able to tolerate po. Will give rx for augmentin and recommend dental and PCP follow up. Patient discharged in stable condition with understanding of reasons to return.   Final Clinical Impressions(s) / ED Diagnoses   Final diagnoses:  Cough  Viral bronchitis  Asthma exacerbation  Pain, dental    New Prescriptions Discharge Medication List as of 07/09/2016  7:20 AM    START taking these medications   Details  amoxicillin-clavulanate (AUGMENTIN) 875-125 MG tablet Take 1 tablet by mouth every 12 (twelve) hours., Starting Wed 07/09/2016, Until Wed 07/16/2016, Print    cyclobenzaprine (FLEXERIL) 10 MG tablet Take 1 tablet (10 mg total) by mouth 2 (two) times daily as needed for muscle spasms., Starting Wed 07/09/2016, Print    predniSONE (DELTASONE) 10 MG tablet Take 4 tablets (40 mg total) by mouth daily., Starting Wed 07/09/2016, Until Sun 07/13/2016, Print         [name], MD 07/09/16 [phone]

## 2016-07-10 DIAGNOSIS — Z5900 Homelessness unspecified: Secondary | ICD-10-CM

## 2016-07-10 NOTE — Congregational Nurse Program (Signed)
Congregational Nurse Program Note  Date of Encounter: 07/10/2016  Past Medical History: Past Medical History:  Diagnosis Date  . Asthma   . Depression   . Homelessness   . Hypertension     Encounter Details:     CNP Questionnaire - 07/10/16 1657      Patient Demographics   Is this a new or existing patient? Existing   Patient is considered a/an Not Applicable   Race African-American/Black     Patient Assistance   Location of Patient Assistance Not Applicable   Patient's financial/insurance status Low Income;Medicaid   Uninsured Patient No   Patient referred to apply for the following financial assistance Not Applicable   Food insecurities addressed Provided food supplies   Transportation assistance No   Assistance securing medications No   Educational health offerings Behavioral health;Hypertension;Medications;Navigating the healthcare system     Encounter Details   Primary purpose of visit Chronic Illness/Condition Visit;Acute Illness/Condition Visit;Education/Health Concerns;Navigating the Healthcare System   Was an Emergency Department visit averted? Not Applicable   Does patient have a medical provider? No   Patient referred to Clinic   Was a mental health screening completed? (GAINS tool) No   Does patient have dental issues? No   Does patient have vision issues? No   Does your patient have an abnormal blood pressure today? Yes   Since previous encounter, have you referred patient for abnormal blood pressure that resulted in a new diagnosis or medication change? No   Does your patient have an abnormal blood glucose today? No   Since previous encounter, have you referred patient for abnormal blood glucose that resulted in a new diagnosis or medication change? No   Was there a life-saving intervention made? No      B/P check.  Is out of lisonopril with no refills.  Referred to [facility] for follow up.

## 2016-07-12 ENCOUNTER — Encounter (HOSPITAL_COMMUNITY): Payer: Self-pay

## 2016-07-12 DIAGNOSIS — Z5321 Procedure and treatment not carried out due to patient leaving prior to being seen by health care provider: Secondary | ICD-10-CM | POA: Insufficient documentation

## 2016-07-12 DIAGNOSIS — F17210 Nicotine dependence, cigarettes, uncomplicated: Secondary | ICD-10-CM | POA: Diagnosis not present

## 2016-07-12 DIAGNOSIS — R0602 Shortness of breath: Secondary | ICD-10-CM | POA: Diagnosis not present

## 2016-07-12 DIAGNOSIS — I10 Essential (primary) hypertension: Secondary | ICD-10-CM | POA: Diagnosis not present

## 2016-07-12 DIAGNOSIS — J45909 Unspecified asthma, uncomplicated: Secondary | ICD-10-CM | POA: Diagnosis not present

## 2016-07-12 MED ORDER — ALBUTEROL SULFATE (2.5 MG/3ML) 0.083% IN NEBU
5.0000 mg | INHALATION_SOLUTION | Freq: Once | RESPIRATORY_TRACT | Status: AC
  Administered 2016-07-12: 5 mg via RESPIRATORY_TRACT

## 2016-07-12 MED ORDER — ALBUTEROL SULFATE (2.5 MG/3ML) 0.083% IN NEBU
INHALATION_SOLUTION | RESPIRATORY_TRACT | Status: AC
  Filled 2016-07-12: qty 6

## 2016-07-12 NOTE — ED Triage Notes (Signed)
Patient here with cough and congestion x 1 week, states that he is using his inhaler with minimal relief, no acute distress. Also wants broken tooth checked, pain to gum

## 2016-07-13 ENCOUNTER — Emergency Department (HOSPITAL_COMMUNITY)
Admission: EM | Admit: 2016-07-13 | Discharge: 2016-07-13 | Disposition: A | Discharge status: Home / Self Care | Payer: Medicaid Other | Attending: Dermatology | Admitting: Dermatology

## 2016-07-13 NOTE — ED Notes (Signed)
Pt called in waiting room but no answer. Pt moved back to waiting.

## 2016-07-13 NOTE — ED Notes (Signed)
Unable to locate patient pt in lobby; pt not in xray either

## 2016-07-29 ENCOUNTER — Emergency Department (HOSPITAL_COMMUNITY)
Admission: EM | Admit: 2016-07-29 | Discharge: 2016-07-30 | Disposition: A | Discharge status: Other Acute Inpatient Hospital | Payer: Medicaid Other | Attending: Emergency Medicine | Admitting: Emergency Medicine

## 2016-07-29 ENCOUNTER — Encounter (HOSPITAL_COMMUNITY): Payer: Self-pay

## 2016-07-29 DIAGNOSIS — R45851 Suicidal ideations: Secondary | ICD-10-CM | POA: Diagnosis present

## 2016-07-29 DIAGNOSIS — J45909 Unspecified asthma, uncomplicated: Secondary | ICD-10-CM | POA: Insufficient documentation

## 2016-07-29 DIAGNOSIS — F32A Depression, unspecified: Secondary | ICD-10-CM

## 2016-07-29 DIAGNOSIS — I10 Essential (primary) hypertension: Secondary | ICD-10-CM | POA: Diagnosis not present

## 2016-07-29 DIAGNOSIS — F329 Major depressive disorder, single episode, unspecified: Secondary | ICD-10-CM | POA: Insufficient documentation

## 2016-07-29 DIAGNOSIS — Z79899 Other long term (current) drug therapy: Secondary | ICD-10-CM | POA: Diagnosis not present

## 2016-07-29 DIAGNOSIS — F17210 Nicotine dependence, cigarettes, uncomplicated: Secondary | ICD-10-CM | POA: Diagnosis not present

## 2016-07-29 DIAGNOSIS — G43809 Other migraine, not intractable, without status migrainosus: Secondary | ICD-10-CM | POA: Insufficient documentation

## 2016-07-29 DIAGNOSIS — R45850 Homicidal ideations: Secondary | ICD-10-CM | POA: Insufficient documentation

## 2016-07-29 LAB — ACETAMINOPHEN LEVEL

## 2016-07-29 LAB — CBC
HCT: 46.1 % (ref 39.0–52.0)
HEMOGLOBIN: 15.8 g/dL (ref 13.0–17.0)
MCH: 33.4 pg (ref 26.0–34.0)
MCHC: 34.3 g/dL (ref 30.0–36.0)
MCV: 97.5 fL (ref 78.0–100.0)
Platelets: 247 10*3/uL (ref 150–400)
RBC: 4.73 MIL/uL (ref 4.22–5.81)
RDW: 14.1 % (ref 11.5–15.5)
WBC: 8.1 10*3/uL (ref 4.0–10.5)

## 2016-07-29 LAB — COMPREHENSIVE METABOLIC PANEL
ALT: 28 U/L (ref 17–63)
ANION GAP: 14 (ref 5–15)
AST: 41 U/L (ref 15–41)
Albumin: 4.5 g/dL (ref 3.5–5.0)
Alkaline Phosphatase: 69 U/L (ref 38–126)
BUN: 24 mg/dL — ABNORMAL HIGH (ref 6–20)
CHLORIDE: 95 mmol/L — AB (ref 101–111)
CO2: 23 mmol/L (ref 22–32)
Calcium: 9.7 mg/dL (ref 8.9–10.3)
Creatinine, Ser: 1.38 mg/dL — ABNORMAL HIGH (ref 0.61–1.24)
GFR calc non Af Amer: 59 mL/min — ABNORMAL LOW (ref >60)
Glucose, Bld: 75 mg/dL (ref 65–99)
POTASSIUM: 3.9 mmol/L (ref 3.5–5.1)
SODIUM: 132 mmol/L — AB (ref 135–145)
Total Bilirubin: 2.2 mg/dL — ABNORMAL HIGH (ref 0.3–1.2)
Total Protein: 7.5 g/dL (ref 6.5–8.1)

## 2016-07-29 LAB — SALICYLATE LEVEL

## 2016-07-29 LAB — RAPID URINE DRUG SCREEN, HOSP PERFORMED
AMPHETAMINES: NOT DETECTED
Barbiturates: NOT DETECTED
Benzodiazepines: NOT DETECTED
COCAINE: POSITIVE — AB
OPIATES: NOT DETECTED
TETRAHYDROCANNABINOL: NOT DETECTED

## 2016-07-29 LAB — ETHANOL: Alcohol, Ethyl (B): <5 mg/dL (ref <5)

## 2016-07-29 MED ORDER — ALBUTEROL SULFATE HFA 108 (90 BASE) MCG/ACT IN AERS
2.0000 | INHALATION_SPRAY | RESPIRATORY_TRACT | Status: DC | PRN
  Administered 2016-07-30: 2 via RESPIRATORY_TRACT
  Filled 2016-07-29: qty 6.7

## 2016-07-29 MED ORDER — NICOTINE 21 MG/24HR TD PT24
21.0000 mg | MEDICATED_PATCH | Freq: Once | TRANSDERMAL | Status: DC
  Filled 2016-07-29: qty 1

## 2016-07-29 MED ORDER — KETOROLAC TROMETHAMINE 30 MG/ML IJ SOLN
30.0000 mg | Freq: Once | INTRAMUSCULAR | Status: DC
  Filled 2016-07-29: qty 1

## 2016-07-29 MED ORDER — DIPHENHYDRAMINE HCL 50 MG/ML IJ SOLN
25.0000 mg | Freq: Once | INTRAMUSCULAR | Status: DC
Start: 2016-07-30 — End: 2016-07-30
  Filled 2016-07-29: qty 1

## 2016-07-29 MED ORDER — PROCHLORPERAZINE EDISYLATE 5 MG/ML IJ SOLN
10.0000 mg | Freq: Once | INTRAMUSCULAR | Status: AC
  Administered 2016-07-30: 10 mg via INTRAMUSCULAR
  Filled 2016-07-29: qty 2

## 2016-07-29 NOTE — ED Provider Notes (Signed)
[facility] DEPT Provider Note   CSN: [id number] Arrival date & time: 07/29/16  2217  By signing my name below, I, [name], attest that this documentation has been prepared under the direction and in the presence of [name], MD. Electronically Signed: [name], ED Scribe. 07/29/16. 11:46 PM.    History   Chief Complaint Chief Complaint  Patient presents with  . Suicidal    HPI Comments: [name] is a 50 y.o. male who presents to the Emergency Department complaining of ongoing suicidal ideation and homicidal ideation onset several days ago. He notes that he has a plan for suicide by jumping off a bridge but no plan for homicide. He reports associated headaches and auditory hallucinations telling him to "kill everyone". He states that he has been to behavioral health and endorses past suicidal attempts. He adds that he has tried to jump off bridges in the past. No alleviating factors noted. Pt has not tried any medications PTA. No fever, chills, or any open wounds.   The history is provided by the patient. No language interpreter was used.    Past Medical History:  Diagnosis Date  . Asthma   . Depression   . Homelessness   . Hypertension     Patient Active Problem List   Diagnosis Date Noted  . Schizoaffective disorder, bipolar type (HCC) 05/31/2016  . Cocaine use disorder, severe, dependence (HCC) 05/31/2016  . Alcohol use disorder, mild, abuse 05/31/2016  . Essential hypertension 05/31/2016    History reviewed. No pertinent surgical history.     Home Medications    Prior to Admission medications   Medication Sig Start Date End Date Taking? Authorizing Provider  albuterol (PROVENTIL HFA;VENTOLIN HFA) 108 (90 Base) MCG/ACT inhaler Inhale 2 puffs into the lungs every 4 (four) hours as needed for wheezing or shortness of breath. 06/05/16  Yes [name], NP  ARIPiprazole (ABILIFY) 10 MG tablet Take 1 tablet (10 mg total) by mouth  at bedtime. For mood control 06/05/16  Yes [name], NP  cyclobenzaprine (FLEXERIL) 10 MG tablet Take 1 tablet (10 mg total) by mouth 2 (two) times daily as needed for muscle spasms. 07/09/16  Yes [name], MD  fluticasone (FLONASE) 50 MCG/ACT nasal spray Place 1 spray into both nostrils daily. For allergies 06/05/16  Yes [name], NP  hydrocerin (EUCERIN) CREA Apply 1 application topically 2 (two) times daily. For dry skin 06/05/16  Yes [name], NP  lamoTRIgine (LAMICTAL) 25 MG tablet Take 2 tablets (50 mg total) by mouth every evening. For mood stabilization 06/05/16  Yes [name], NP  lisinopril (PRINIVIL,ZESTRIL) 20 MG tablet Take 1 tablet (20 mg total) by mouth daily. For high blood pressure 06/05/16  Yes [name], NP  magic mouthwash SOLN Take 5 mLs by mouth 4 (four) times daily. For oral thrush 06/05/16  Yes [name], NP  traZODone (DESYREL) 100 MG tablet Take 1 tablet (100 mg total) by mouth at bedtime. For sleep 06/05/16  Yes [name], NP    Family History Family History  Problem Relation Age of Onset  . Drug abuse Mother   . Suicidality Mother     Social History Social History  Substance Use Topics  . Smoking status: Current Every Day Smoker    Types: Cigarettes  . Smokeless tobacco: Never Used  . Alcohol use Yes     Comment: infrequently     Allergies   Tylenol [acetaminophen]  Review of Systems Review of Systems  Constitutional: Negative for chills and fever.  Skin: Negative for wound.  Neurological: Positive for headaches.  Psychiatric/Behavioral: Positive for hallucinations and suicidal ideas.  All other systems reviewed and are negative.    Physical Exam Updated Vital Signs BP 120/82 (BP Location: Left Arm)   Pulse 85   Temp 98 F (36.7 C) (Oral)   Resp 18   Ht 5\' 9"  (1.753 m)   Wt 175 lb (79.4 kg)   SpO2 100%   BMI 25.84 kg/m   Physical Exam  Constitutional: He is oriented to person, place, and time. He appears  well-developed and well-nourished. No distress.  HENT:  Head: Normocephalic and atraumatic.  Right Ear: Hearing normal.  Left Ear: Hearing normal.  Nose: Nose normal.  Mouth/Throat: Oropharynx is clear and moist and mucous membranes are normal.  Eyes: Conjunctivae and EOM are normal. Pupils are equal, round, and reactive to light.  Neck: Normal range of motion. Neck supple.  Cardiovascular: Regular rhythm, S1 normal and S2 normal.  Exam reveals no gallop and no friction rub.   No murmur heard. Pulmonary/Chest: Effort normal and breath sounds normal. No respiratory distress. He exhibits no tenderness.  Abdominal: Soft. Normal appearance and bowel sounds are normal. There is no hepatosplenomegaly. There is no tenderness. There is no rebound, no guarding, no tenderness at McBurney's point and negative Murphy's sign. No hernia.  Musculoskeletal: Normal range of motion.  Neurological: He is alert and oriented to person, place, and time. He has normal strength. No cranial nerve deficit or sensory deficit. Coordination normal. GCS eye subscore is 4. GCS verbal subscore is 5. GCS motor subscore is 6.  Skin: Skin is warm, dry and intact. No rash noted. No cyanosis.  Psychiatric: His speech is normal. He is slowed and actively hallucinating. He exhibits a depressed mood. He expresses homicidal and suicidal ideation. He expresses suicidal plans. He expresses no homicidal plans.  Nursing note and vitals reviewed.    ED Treatments / Results  DIAGNOSTIC STUDIES: Oxygen Saturation is 100% on RA, normal by my interpretation.    COORDINATION OF CARE: 11:43 PM- Pt advised of plan for treatment and pt agrees. He will receive lab work for further evaluation.    Labs (all labs ordered are listed, but only abnormal results are displayed) Labs Reviewed  COMPREHENSIVE METABOLIC PANEL - Abnormal; Notable for the following:       Result Value   Sodium 132 (*)    Chloride 95 (*)    BUN 24 (*)    Creatinine,  Ser 1.38 (*)    Total Bilirubin 2.2 (*)    GFR calc non Af Amer 59 (*)    All other components within normal limits  ACETAMINOPHEN LEVEL - Abnormal; Notable for the following:    Acetaminophen (Tylenol), Serum <10 (*)    All other components within normal limits  URINE RAPID DRUG SCREEN, HOSP PERFORMED - Abnormal; Notable for the following:    Cocaine POSITIVE (*)    All other components within normal limits  ETHANOL  SALICYLATE LEVEL  CBC    EKG  EKG Interpretation None       Radiology Ct Head Wo Contrast  Result Date: 07/30/2016 CLINICAL DATA:  Headache EXAM: CT HEAD WITHOUT CONTRAST TECHNIQUE: Contiguous axial images were obtained from the base of the skull through the vertex without intravenous contrast. COMPARISON:  None. FINDINGS: Brain: There is no mass lesion, intraparenchymal hemorrhage or extra-axial collection. No evidence of acute  cortical infarct. No hydrocephalus. Brain parenchyma and CSF-containing spaces are normal for age. There is no expansion or abnormal hyperdensity of the visible dural venous sinuses. No colloid cyst is identified. Normal position of the cerebellar tonsils. Vascular: No hyperdense vessel or unexpected calcification. Skull: Normal Sinuses/Orbits: Moderate mucosal thickening of the left ethmoid air cells and inferior left frontal sinus with occlusion of the left frontal recess. No fluid levels. Normal orbits. Other: Visualized extracranial soft tissues are normal. IMPRESSION: 1. Normal appearance of the brain. No acute intracranial abnormality. 2. Left frontoethmoidal sinus mucosal thickening. Electronically Signed   By: Deatra Robinson M.D.   On: 07/30/2016 01:02    Procedures Procedures (including critical care time)  Medications Ordered in ED Medications  ketorolac (TORADOL) 30 MG/ML injection 30 mg (30 mg Intramuscular Refused 07/30/16 0024)  diphenhydrAMINE (BENADRYL) injection 25 mg (25 mg Intramuscular Refused 07/30/16 0024)  albuterol  (PROVENTIL HFA;VENTOLIN HFA) 108 (90 Base) MCG/ACT inhaler 2 puff (2 puffs Inhalation Given 07/30/16 0023)  nicotine (NICODERM CQ - dosed in mg/24 hours) patch 21 mg (21 mg Transdermal Refused 07/30/16 0024)  prochlorperazine (COMPAZINE) injection 10 mg (10 mg Intramuscular Given 07/30/16 0022)     Initial Impression / Assessment and Plan / ED Course  [name], MD has reviewed the triage vital signs and the nursing notes.  Pertinent labs & imaging results that were available during my care of the patient were reviewed by me and considered in my medical decision making (see chart for details).  Clinical Course   Patient initially presented with depression. Patient has a long history of depression. He reports that over the last couple of days his depression has worsened that he has become suicidal. He has a plan to jump off a bridge. He does have previous suicide attempts. Patient also endorses auditory hallucinations commanding him to harm himself and others.  Patient complaining of headache. He reports that he does have a history of recurrent headaches similar to this headache. CT scan performed to rule out serious intracranial abnormality. Patient treated with migraine cocktail.  Patient is medically clear for psychiatric evaluation and treatment.  Final Clinical Impressions(s) / ED Diagnoses   Final diagnoses:  Suicidal ideation  Depression  Other type of migraine    New Prescriptions New Prescriptions   No medications on file   I personally performed the services described in this documentation, which was scribed in my presence. The recorded information has been reviewed and is accurate.    [name], MD 07/30/16 [phone]

## 2016-07-29 NOTE — ED Notes (Signed)
Pt changed into purple scrubs and pts items bagged and security wanded pt.

## 2016-07-29 NOTE — ED Notes (Signed)
Notified staffing of room change. STILL NEEDS TO BE WANDED BY SECURITY

## 2016-07-29 NOTE — ED Notes (Signed)
MD aware of headache.

## 2016-07-29 NOTE — ED Notes (Signed)
Security at bedside wanding pt 

## 2016-07-29 NOTE — ED Notes (Signed)
EDP at bedside  

## 2016-07-29 NOTE — ED Triage Notes (Signed)
Pt states that he is having anxiety and depression. Pt endorses in SI and HI. States he wants to jumps off a side of a building and that he just wants to kill everybody. Pt states that he hears voices that tell him to hurt others and himself. Pt states that is medication is not working.

## 2016-07-30 ENCOUNTER — Encounter (HOSPITAL_COMMUNITY): Payer: Self-pay | Admitting: *Deleted

## 2016-07-30 ENCOUNTER — Emergency Department (HOSPITAL_COMMUNITY): Payer: Medicaid Other

## 2016-07-30 ENCOUNTER — Inpatient Hospital Stay (HOSPITAL_COMMUNITY)
Admission: AD | Admit: 2016-07-30 | Discharge: 2016-08-06 | DRG: 885 | Disposition: A | Discharge status: Home / Self Care | Payer: Medicaid Other | Source: Intra-hospital | Attending: Psychiatry | Admitting: Psychiatry

## 2016-07-30 DIAGNOSIS — F17210 Nicotine dependence, cigarettes, uncomplicated: Secondary | ICD-10-CM | POA: Diagnosis present

## 2016-07-30 DIAGNOSIS — F1420 Cocaine dependence, uncomplicated: Secondary | ICD-10-CM | POA: Diagnosis present

## 2016-07-30 DIAGNOSIS — F1994 Other psychoactive substance use, unspecified with psychoactive substance-induced mood disorder: Secondary | ICD-10-CM | POA: Diagnosis not present

## 2016-07-30 DIAGNOSIS — R45851 Suicidal ideations: Secondary | ICD-10-CM | POA: Diagnosis present

## 2016-07-30 DIAGNOSIS — I10 Essential (primary) hypertension: Secondary | ICD-10-CM | POA: Diagnosis present

## 2016-07-30 DIAGNOSIS — R45850 Homicidal ideations: Secondary | ICD-10-CM | POA: Diagnosis present

## 2016-07-30 DIAGNOSIS — Z79899 Other long term (current) drug therapy: Secondary | ICD-10-CM | POA: Diagnosis not present

## 2016-07-30 DIAGNOSIS — F250 Schizoaffective disorder, bipolar type: Principal | ICD-10-CM | POA: Diagnosis present

## 2016-07-30 DIAGNOSIS — F329 Major depressive disorder, single episode, unspecified: Secondary | ICD-10-CM | POA: Diagnosis not present

## 2016-07-30 DIAGNOSIS — F419 Anxiety disorder, unspecified: Secondary | ICD-10-CM | POA: Diagnosis present

## 2016-07-30 DIAGNOSIS — F1010 Alcohol abuse, uncomplicated: Secondary | ICD-10-CM | POA: Diagnosis present

## 2016-07-30 DIAGNOSIS — J45909 Unspecified asthma, uncomplicated: Secondary | ICD-10-CM | POA: Diagnosis present

## 2016-07-30 DIAGNOSIS — K0889 Other specified disorders of teeth and supporting structures: Secondary | ICD-10-CM | POA: Diagnosis present

## 2016-07-30 DIAGNOSIS — G4700 Insomnia, unspecified: Secondary | ICD-10-CM | POA: Diagnosis present

## 2016-07-30 IMAGING — CT CT HEAD W/O CM
3 series · 14 of 47 positions shown, 16 images · non-contrast
Comparison: None.

CLINICAL DATA: Headache

EXAM:
CT HEAD WITHOUT CONTRAST
TECHNIQUE: Contiguous axial images were obtained from the base of the skull
through the vertex without intravenous contrast.

[Series 2: head 5.0 h30s · axial · 0.47mm/px · z∈[-142,-17]mm · 8 of 31 slices shown, 10 images]
[im 3/31  brain]
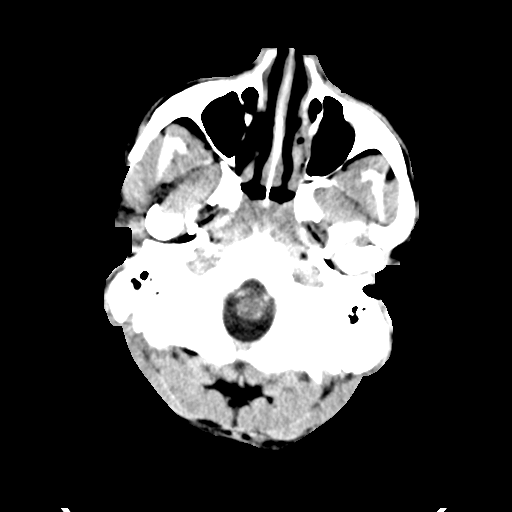
[im 3/31  bone]
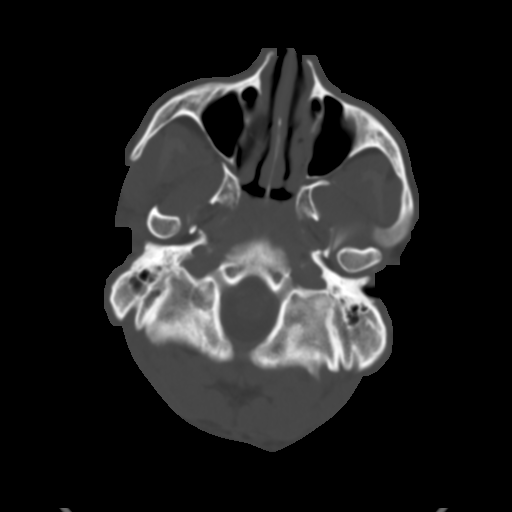
[im 7/31  brain]
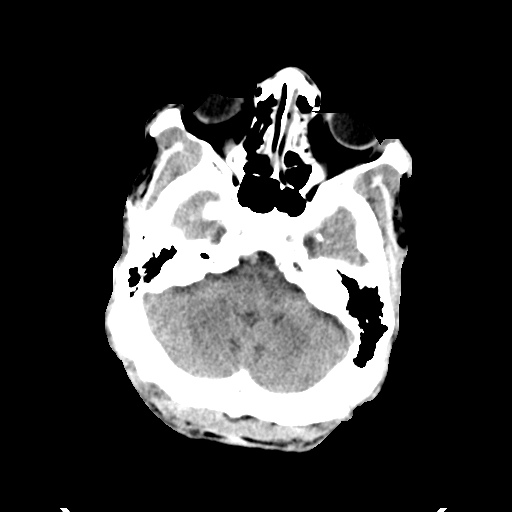
[im 10/31  brain]
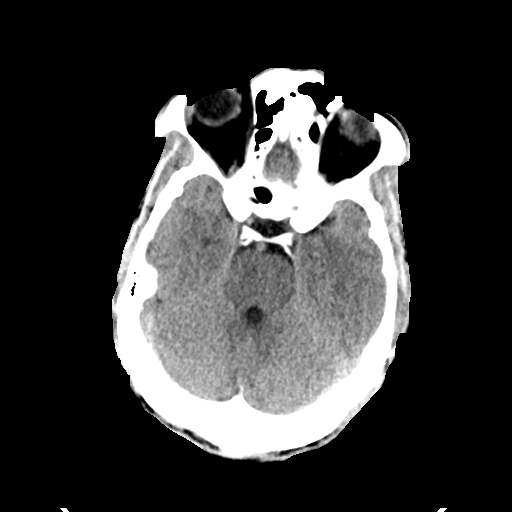
[im 14/31  brain]
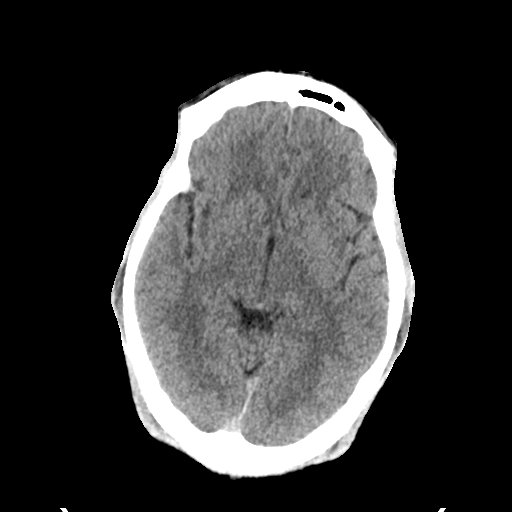
[im 17/31  brain]
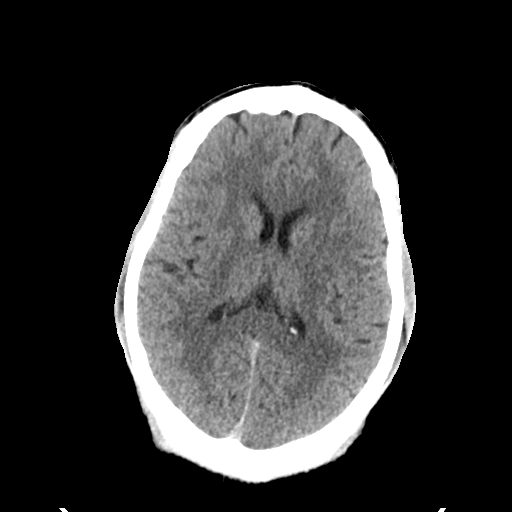
[im 17/31  bone]
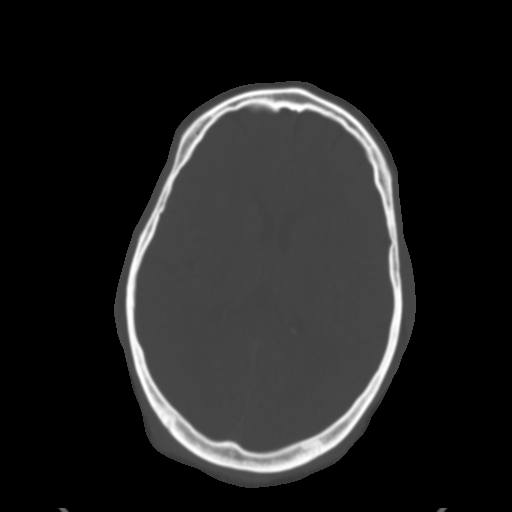
[im 21/31  brain]
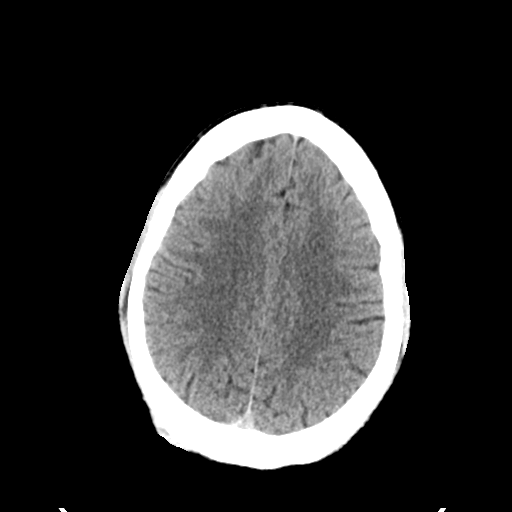
[im 24/31  brain]
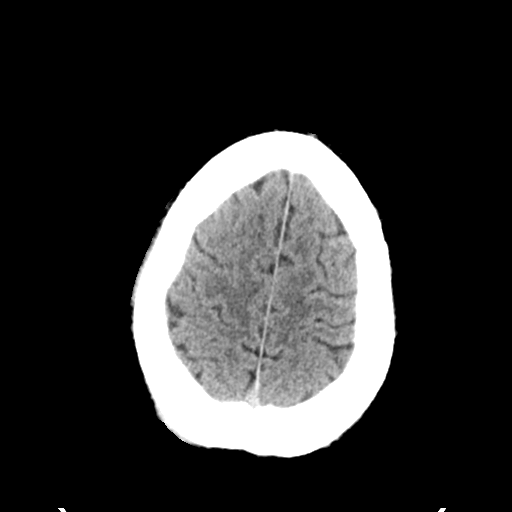
[im 28/31  brain]
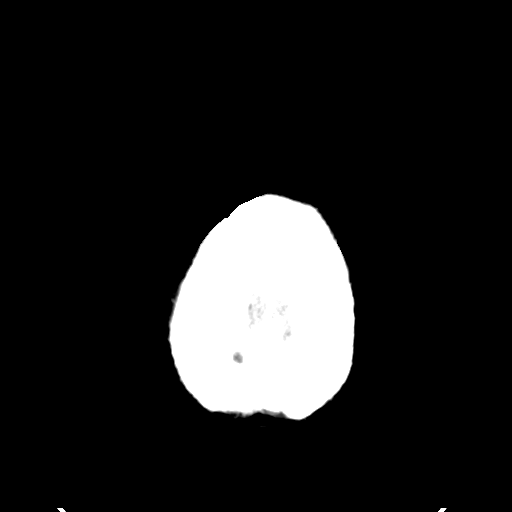

[Series 4: head 3.0 mpr · coronal · 0.33mm/px · 3 of 63 slices shown (1 of 2)]
[im 21/63  brain]
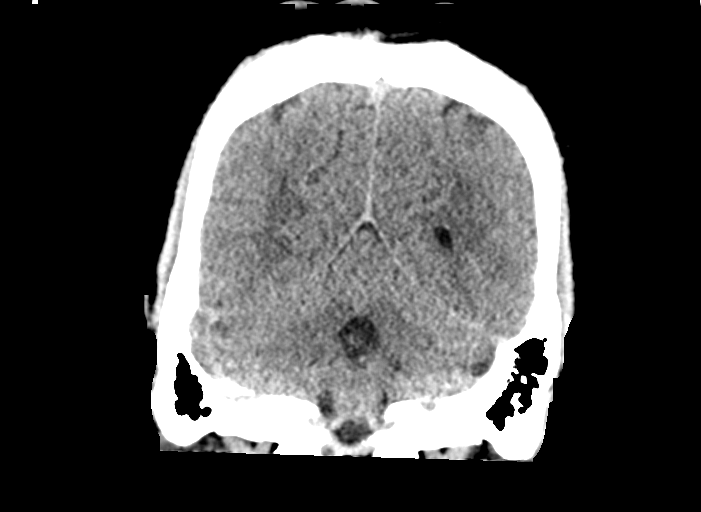
[im 28/63  brain]
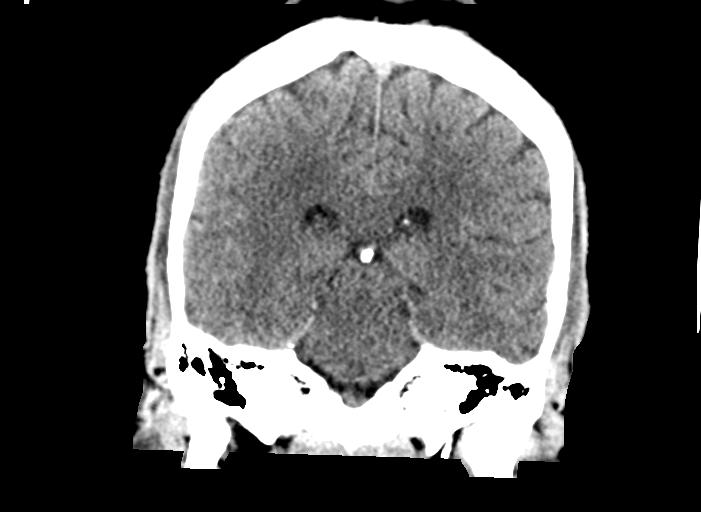
[im 35/63  brain]
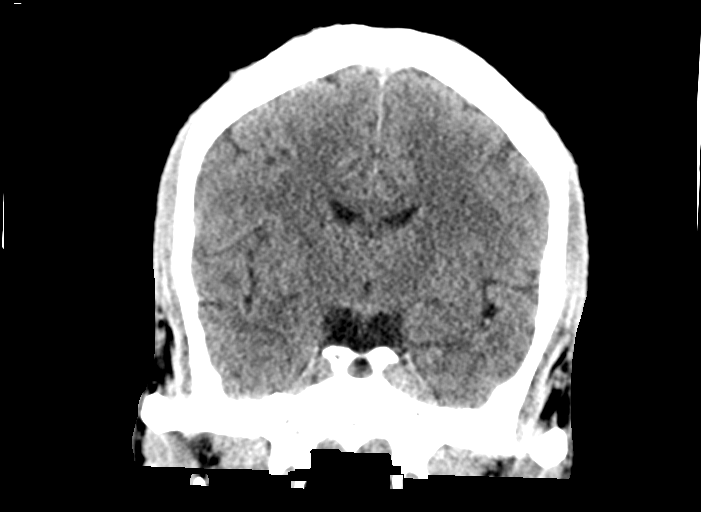

[Series 5: head 3.0 mpr · sagittal · 0.30mm/px · 3 of 49 slices shown (2 of 2)]
[im 17/49  brain]
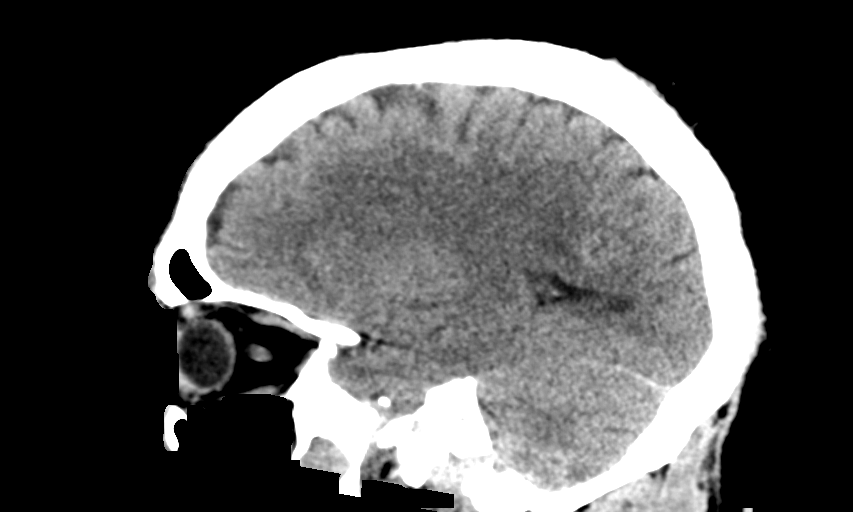
[im 25/49  brain]
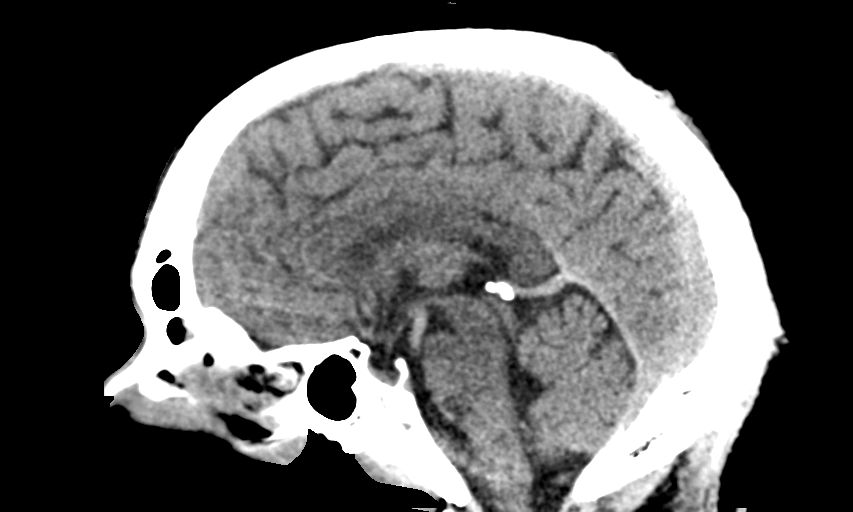
[im 33/49  brain]
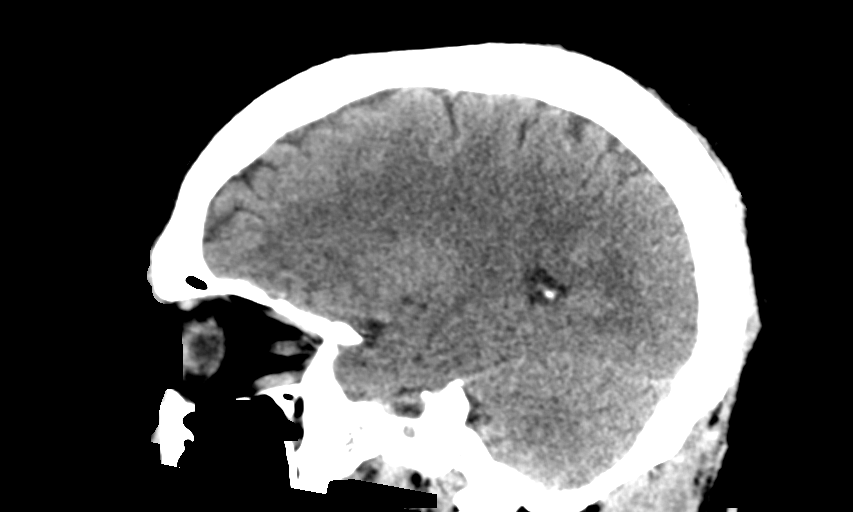

[14 of 47 positions shown; findings below may reference images not displayed]

FINDINGS: Brain: There is no mass lesion, intraparenchymal hemorrhage or
extra-axial collection. No evidence of acute cortical infarct. No
hydrocephalus. Brain parenchyma and CSF-containing spaces are normal
for age. There is no expansion or abnormal hyperdensity of the
visible dural venous sinuses. No colloid cyst is identified. Normal
position of the cerebellar tonsils.

Vascular: No hyperdense vessel or unexpected calcification.

Skull: Normal

Sinuses/Orbits: Moderate mucosal thickening of the left ethmoid air
cells and inferior left frontal sinus with occlusion of the left
frontal recess. No fluid levels. Normal orbits.

Other: Visualized extracranial soft tissues are normal.
IMPRESSION: 1. Normal appearance of the brain. No acute intracranial
abnormality.
2. Left frontoethmoidal sinus mucosal thickening.

## 2016-07-30 MED ORDER — VITAMIN B-1 100 MG PO TABS
100.0000 mg | ORAL_TABLET | Freq: Every day | ORAL | Status: DC
  Administered 2016-08-01 – 2016-08-05 (×3): 100 mg via ORAL
  Filled 2016-07-30 (×9): qty 1

## 2016-07-30 MED ORDER — HYDROXYZINE HCL 25 MG PO TABS: 25.0000 mg | ORAL_TABLET | Freq: Four times a day (QID) | ORAL | Status: AC | PRN

## 2016-07-30 MED ORDER — MAGNESIUM HYDROXIDE 400 MG/5ML PO SUSP: 30.0000 mL | Freq: Every day | ORAL | Status: DC | PRN

## 2016-07-30 MED ORDER — THIAMINE HCL 100 MG/ML IJ SOLN: 100.0000 mg | Freq: Once | INTRAMUSCULAR | Status: DC

## 2016-07-30 MED ORDER — LORAZEPAM 1 MG PO TABS
0.0000 mg | ORAL_TABLET | Freq: Four times a day (QID) | ORAL | Status: DC
Start: 2016-07-30 — End: 2016-07-30

## 2016-07-30 MED ORDER — CHLORDIAZEPOXIDE HCL 25 MG PO CAPS: 25.0000 mg | ORAL_CAPSULE | Freq: Every day | ORAL | Status: AC

## 2016-07-30 MED ORDER — LAMOTRIGINE 25 MG PO TABS: 50.0000 mg | ORAL_TABLET | Freq: Every evening | ORAL | Status: DC

## 2016-07-30 MED ORDER — CHLORDIAZEPOXIDE HCL 25 MG PO CAPS
25.0000 mg | ORAL_CAPSULE | Freq: Three times a day (TID) | ORAL | Status: AC
  Filled 2016-07-30: qty 1

## 2016-07-30 MED ORDER — ALBUTEROL SULFATE HFA 108 (90 BASE) MCG/ACT IN AERS
1.0000 | INHALATION_SPRAY | Freq: Four times a day (QID) | RESPIRATORY_TRACT | Status: DC | PRN
  Administered 2016-07-31: 2 via RESPIRATORY_TRACT
  Filled 2016-07-30 (×2): qty 6.7

## 2016-07-30 MED ORDER — LORAZEPAM 1 MG PO TABS
0.0000 mg | ORAL_TABLET | Freq: Two times a day (BID) | ORAL | Status: DC
Start: 2016-08-01 — End: 2016-07-30

## 2016-07-30 MED ORDER — ONDANSETRON HCL 4 MG PO TABS: 4.0000 mg | ORAL_TABLET | Freq: Three times a day (TID) | ORAL | Status: DC | PRN

## 2016-07-30 MED ORDER — CHLORDIAZEPOXIDE HCL 25 MG PO CAPS: 25.0000 mg | ORAL_CAPSULE | Freq: Four times a day (QID) | ORAL | Status: AC | PRN

## 2016-07-30 MED ORDER — THIAMINE HCL 100 MG/ML IJ SOLN: 100.0000 mg | Freq: Every day | INTRAMUSCULAR | Status: DC

## 2016-07-30 MED ORDER — LISINOPRIL 20 MG PO TABS: 20.0000 mg | ORAL_TABLET | Freq: Every day | ORAL | Status: DC

## 2016-07-30 MED ORDER — ONDANSETRON 4 MG PO TBDP: 4.0000 mg | ORAL_TABLET | Freq: Four times a day (QID) | ORAL | Status: AC | PRN

## 2016-07-30 MED ORDER — NICOTINE 21 MG/24HR TD PT24
21.0000 mg | MEDICATED_PATCH | Freq: Every day | TRANSDERMAL | Status: DC
  Filled 2016-07-30 (×4): qty 1

## 2016-07-30 MED ORDER — CHLORDIAZEPOXIDE HCL 25 MG PO CAPS: 25.0000 mg | ORAL_CAPSULE | ORAL | Status: AC

## 2016-07-30 MED ORDER — LOPERAMIDE HCL 2 MG PO CAPS: 2.0000 mg | ORAL_CAPSULE | ORAL | Status: AC | PRN

## 2016-07-30 MED ORDER — IBUPROFEN 400 MG PO TABS: 600.0000 mg | ORAL_TABLET | Freq: Three times a day (TID) | ORAL | Status: DC | PRN

## 2016-07-30 MED ORDER — ADULT MULTIVITAMIN W/MINERALS CH
1.0000 | ORAL_TABLET | Freq: Every day | ORAL | Status: DC
  Administered 2016-08-01 – 2016-08-05 (×3): 1 via ORAL
  Filled 2016-07-30 (×10): qty 1

## 2016-07-30 MED ORDER — ARIPIPRAZOLE 10 MG PO TABS: 10.0000 mg | ORAL_TABLET | Freq: Every day | ORAL | Status: DC

## 2016-07-30 MED ORDER — TRAZODONE HCL 100 MG PO TABS: 100.0000 mg | ORAL_TABLET | Freq: Every day | ORAL | Status: DC

## 2016-07-30 MED ORDER — TRAZODONE HCL 50 MG PO TABS
50.0000 mg | ORAL_TABLET | Freq: Every evening | ORAL | Status: DC | PRN
  Filled 2016-07-30 (×8): qty 1

## 2016-07-30 MED ORDER — VITAMIN B-1 100 MG PO TABS: 100.0000 mg | ORAL_TABLET | Freq: Every day | ORAL | Status: DC

## 2016-07-30 MED ORDER — OLANZAPINE 2.5 MG PO TABS
2.5000 mg | ORAL_TABLET | Freq: Two times a day (BID) | ORAL | Status: DC
  Filled 2016-07-30 (×8): qty 1

## 2016-07-30 MED ORDER — HYDROXYZINE HCL 25 MG PO TABS: 25.0000 mg | ORAL_TABLET | Freq: Three times a day (TID) | ORAL | Status: DC | PRN

## 2016-07-30 MED ORDER — CHLORDIAZEPOXIDE HCL 25 MG PO CAPS: 25.0000 mg | ORAL_CAPSULE | Freq: Four times a day (QID) | ORAL | Status: AC

## 2016-07-30 MED ORDER — LISINOPRIL 20 MG PO TABS
20.0000 mg | ORAL_TABLET | Freq: Every day | ORAL | Status: DC
  Administered 2016-07-31 – 2016-08-03 (×4): 20 mg via ORAL
  Filled 2016-07-30 (×6): qty 1

## 2016-07-30 MED ORDER — ALUM & MAG HYDROXIDE-SIMETH 200-200-20 MG/5ML PO SUSP: 30.0000 mL | ORAL | Status: DC | PRN

## 2016-07-30 NOTE — H&P (Addendum)
Psychiatric Admission Assessment Adult  Patient Identification: [name] MRN:  [id number] Date of Evaluation:  07/30/2016 Chief Complaint:  MDD Substance Abuse disorder Principal Diagnosis: Schizoaffective disorder, bipolar type ([place]) Diagnosis:   Patient Active Problem List   Diagnosis Date Noted  . Substance induced mood disorder ([place]) [F19.94] 07/30/2016  . Schizoaffective disorder, bipolar type ([place]) [F25.0] 05/31/2016  . Cocaine use disorder, severe, dependence ([place]) [F14.20] 05/31/2016  . Alcohol use disorder, mild, abuse [F10.10] 05/31/2016  . Essential hypertension [I10] 05/31/2016   History of Present Illness:  50 year old male with schizoaffective disorder, bipolar type, cocaine use disorder, alcohol use disorder, hypertension who presented to ED with SI with plan to jump off a bridge and HI without plans with CAH of "killing everyone"for a few days. UDS positive for cocaine, EtOH <5 on 07/29/2016  Per chart review, patient was last admitted to [facility] 6/30-06/05/2016 for worsening depression with SI to jump off a building without significant substance use (BAL 7). Per note he has had "possible Antisocial personality disorder -was observed as displaying behavioral problems on the unit often." Discharged on medication with Lamotrigine 25 mg, Trazodone 100 mg, Abilify 10 mg.   He states that he lost his mother by suicide at age 23. He has been using drugs and he came here for worsening voices. He hears voices of killing himself, hurting others. He heard voice, last two days ago. He has been using cocaine to let go voices away but reports it worsens afterwards. He has been using cocaine "once in a while," and alcohol "possibly" every day, drinking beers, liquors (does not quantify). Last drink yesterday. He complains about last admission at [facility], stating that nobody was helping him and he was "kicked out of the street." He declines to talk about the past trials of medication, stating that  he wants medical opinion from this interviewer, but not the past. He declines to answer [place] of his family history, stating that he does not know about his family. Noted that he left the interview room when he was told he will not be prescribed on tramadol.   Past psych Hx:  Psychiatry admission: several, last admission in [facility] in 05/2016.  Suicide attempt: several suicide attempts , jump off roof top, last attempt early in Jan by overdosing tylenol which he has allergy.   Associated Signs/Symptoms: Depression Symptoms:  depressed mood, insomnia, hopelessness, (Hypo) Manic Symptoms:  denies Anxiety Symptoms:  mild anxiety Psychotic Symptoms:  Hallucinations: Command:  kill self, others PTSD Symptoms: Negative Total Time spent with patient: 45 minutes  Past Psychiatric History: schizoaffective disorder, cocaine use disorder, alcohol use disorder,  Is the patient at risk to self? Yes.    Has the patient been a risk to self in the past 6 months? Yes.    Has the patient been a risk to self within the distant past? Yes.    Is the patient a risk to others? Yes.    Has the patient been a risk to others in the past 6 months? Yes.    Has the patient been a risk to others within the distant past? No.   Prior Inpatient Therapy:  yes, last in [facility] in 05/2016 Prior Outpatient Therapy:    Alcohol Screening: 1. How often do you have a drink containing alcohol?: 4 or more times a week 2. How many drinks containing alcohol do you have on a typical day when you are drinking?: 10 or more 3. How often do you have six  or more drinks on one occasion?: Daily or almost daily Preliminary Score: 8 4. How often during the last year have you found that you were not able to stop drinking once you had started?: Never 5. How often during the last year have you failed to do what was normally expected from you becasue of drinking?: Never 6. How often during the last year have you needed a first drink in the morning to  get yourself going after a heavy drinking session?: Daily or almost daily 7. How often during the last year have you had a feeling of guilt of remorse after drinking?: Never 8. How often during the last year have you been unable to remember what happened the night before because you had been drinking?: Never 9. Have you or someone else been injured as a result of your drinking?: No 10. Has a relative or friend or a doctor or another health worker been concerned about your drinking or suggested you cut down?: No Alcohol Use Disorder Identification Test Final Score (AUDIT): 16 Brief Intervention: Patient declined brief intervention Substance Abuse History in the last 12 months:  Yes.   Consequences of Substance Abuse: worsening mood, psychotic symptoms Previous Psychotropic Medications: Yes  Psychological Evaluations: Yes  Past Medical History:  Past Medical History:  Diagnosis Date  . Asthma   . Depression   . Homelessness   . Hypertension    History reviewed. No pertinent surgical history. Family History:  Family History  Problem Relation Age of Onset  . Drug abuse Mother   . Suicidality Mother    Family Psychiatric  History: declines to talk about his family, mother attempted suicide Tobacco Screening: Have you used any form of tobacco in the last 30 days? (Cigarettes, Smokeless Tobacco, Cigars, and/or Pipes): Yes Tobacco use, Select all that apply: 5 or more cigarettes per day Are you interested in Tobacco Cessation Medications?: No, patient refused Counseled patient on smoking cessation including recognizing danger situations, developing coping skills and basic information about quitting provided: Refused/Declined practical counseling Social History:  History  Alcohol Use  . Yes    Comment: infrequently     History  Drug Use  . Types: Cocaine    Additional Social History:         4 boys, in foster services                    Allergies:   Allergies   Allergen Reactions  . Tylenol [Acetaminophen] Nausea And Vomiting   Lab Results:  Results for orders placed or performed during the hospital encounter of 07/29/16 (from the past 48 hour(s))  Comprehensive metabolic panel     Status: Abnormal   Collection Time: 07/29/16 10:53 PM  Result Value Ref Range   Sodium 132 (L) 135 - 145 mmol/L   Potassium 3.9 3.5 - 5.1 mmol/L   Chloride 95 (L) 101 - 111 mmol/L   CO2 23 22 - 32 mmol/L   Glucose, Bld 75 65 - 99 mg/dL   BUN 24 (H) 6 - 20 mg/dL   Creatinine, Ser 1.38 (H) 0.61 - 1.24 mg/dL   Calcium 9.7 8.9 - 10.3 mg/dL   Total Protein 7.5 6.5 - 8.1 g/dL   Albumin 4.5 3.5 - 5.0 g/dL   AST 41 15 - 41 U/L   ALT 28 17 - 63 U/L   Alkaline Phosphatase 69 38 - 126 U/L   Total Bilirubin 2.2 (H) 0.3 - 1.2 mg/dL   GFR calc non  Af Amer 59 (L) >60 mL/min   GFR calc Af Amer >60 >60 mL/min    Comment: (NOTE) The eGFR has been calculated using the CKD EPI equation. This calculation has not been validated in all clinical situations. eGFR's persistently <60 mL/min signify possible Chronic Kidney Disease.    Anion gap 14 5 - 15  Ethanol     Status: None   Collection Time: 07/29/16 10:53 PM  Result Value Ref Range   Alcohol, Ethyl (B) <5 <5 mg/dL    Comment:        LOWEST DETECTABLE LIMIT FOR SERUM ALCOHOL IS 5 mg/dL FOR MEDICAL PURPOSES ONLY   Salicylate level     Status: None   Collection Time: 07/29/16 10:53 PM  Result Value Ref Range   Salicylate Lvl <1.0 2.8 - 30.0 mg/dL  Acetaminophen level     Status: Abnormal   Collection Time: 07/29/16 10:53 PM  Result Value Ref Range   Acetaminophen (Tylenol), Serum <10 (L) 10 - 30 ug/mL    Comment:        THERAPEUTIC CONCENTRATIONS VARY SIGNIFICANTLY. A RANGE OF 10-30 ug/mL MAY BE AN EFFECTIVE CONCENTRATION FOR MANY PATIENTS. HOWEVER, SOME ARE BEST TREATED AT CONCENTRATIONS OUTSIDE THIS RANGE. ACETAMINOPHEN CONCENTRATIONS >150 ug/mL AT 4 HOURS AFTER INGESTION AND >50 ug/mL AT 12 HOURS AFTER  INGESTION ARE OFTEN ASSOCIATED WITH TOXIC REACTIONS.   cbc     Status: None   Collection Time: 07/29/16 10:53 PM  Result Value Ref Range   WBC 8.1 4.0 - 10.5 K/uL   RBC 4.73 4.22 - 5.81 MIL/uL   Hemoglobin 15.8 13.0 - 17.0 g/dL   HCT 46.1 39.0 - 52.0 %   MCV 97.5 78.0 - 100.0 fL   MCH 33.4 26.0 - 34.0 pg   MCHC 34.3 30.0 - 36.0 g/dL   RDW 14.1 11.5 - 15.5 %   Platelets 247 150 - 400 K/uL  Rapid urine drug screen (hospital performed)     Status: Abnormal   Collection Time: 07/29/16 11:02 PM  Result Value Ref Range   Opiates NONE DETECTED NONE DETECTED   Cocaine POSITIVE (A) NONE DETECTED   Benzodiazepines NONE DETECTED NONE DETECTED   Amphetamines NONE DETECTED NONE DETECTED   Tetrahydrocannabinol NONE DETECTED NONE DETECTED   Barbiturates NONE DETECTED NONE DETECTED    Comment:        DRUG SCREEN FOR MEDICAL PURPOSES ONLY.  IF CONFIRMATION IS NEEDED FOR ANY PURPOSE, NOTIFY LAB WITHIN 5 DAYS.        LOWEST DETECTABLE LIMITS FOR URINE DRUG SCREEN Drug Class       Cutoff (ng/mL) Amphetamine      1000 Barbiturate      200 Benzodiazepine   258 Tricyclics       527 Opiates          300 Cocaine          300 THC              50     Blood Alcohol level:  Lab Results  Component Value Date   ETH <5 07/29/2016   ETH 7 (H) 78/24/2353    Metabolic Disorder Labs:  Lab Results  Component Value Date   HGBA1C 5.7 (H) 06/02/2016   MPG 117 06/02/2016   Lab Results  Component Value Date   PROLACTIN 9.6 06/02/2016   Lab Results  Component Value Date   CHOL 194 06/02/2016   TRIG 52 06/02/2016   HDL 103 06/02/2016   CHOLHDL  1.9 06/02/2016   VLDL 10 06/02/2016   LDLCALC 81 06/02/2016    Current Medications: Current Facility-Administered Medications  Medication Dose Route Frequency Provider Last Rate Last Dose  . alum & mag hydroxide-simeth (MAALOX/MYLANTA) 200-200-20 MG/5ML suspension 30 mL  30 mL Oral Q4H PRN Nanci Pina, FNP      . hydrOXYzine (ATARAX/VISTARIL)  tablet 25 mg  25 mg Oral TID PRN Nanci Pina, FNP      . magnesium hydroxide (MILK OF MAGNESIA) suspension 30 mL  30 mL Oral Daily PRN Nanci Pina, FNP      . [START ON 07/31/2016] nicotine (NICODERM CQ - dosed in mg/24 hours) patch 21 mg  21 mg Transdermal Q0600 Nanci Pina, FNP      . traZODone (DESYREL) tablet 50 mg  50 mg Oral QHS,MR X 1 Nanci Pina, FNP       PTA Medications: Prescriptions Prior to Admission  Medication Sig Dispense Refill Last Dose  . albuterol (PROVENTIL HFA;VENTOLIN HFA) 108 (90 Base) MCG/ACT inhaler Inhale 2 puffs into the lungs every 4 (four) hours as needed for wheezing or shortness of breath.   Past Month at Unknown time  . ARIPiprazole (ABILIFY) 10 MG tablet Take 1 tablet (10 mg total) by mouth at bedtime. For mood control 30 tablet 0 Past Month at Unknown time  . cyclobenzaprine (FLEXERIL) 10 MG tablet Take 1 tablet (10 mg total) by mouth 2 (two) times daily as needed for muscle spasms. 10 tablet 0 Past Month at Unknown time  . fluticasone (FLONASE) 50 MCG/ACT nasal spray Place 1 spray into both nostrils daily. For allergies  2 Past Month at Unknown time  . hydrocerin (EUCERIN) CREA Apply 1 application topically 2 (two) times daily. For dry skin  0 Past Month at Unknown time  . lamoTRIgine (LAMICTAL) 25 MG tablet Take 2 tablets (50 mg total) by mouth every evening. For mood stabilization 60 tablet 0 Past Month at Unknown time  . lisinopril (PRINIVIL,ZESTRIL) 20 MG tablet Take 1 tablet (20 mg total) by mouth daily. For high blood pressure 30 tablet 0 Past Month at Unknown time  . magic mouthwash SOLN Take 5 mLs by mouth 4 (four) times daily. For oral thrush  0 Past Month at Unknown time  . traZODone (DESYREL) 100 MG tablet Take 1 tablet (100 mg total) by mouth at bedtime. For sleep 30 tablet 0 Past Month at Unknown time    Musculoskeletal: Strength & Muscle Tone: within normal limits Gait & Station: normal Patient leans: N/A  Psychiatric Specialty  Exam: Physical Exam  Nursing note and vitals reviewed. Constitutional: He is oriented to person, place, and time. He appears well-developed and well-nourished.  Neurological: He is alert and oriented to person, place, and time.    Review of Systems  Unable to perform ROS: Mental acuity    Blood pressure (!) 175/107, pulse 68, resp. rate 18, height '5\' 9"'  (1.753 m), weight 176 lb 5.9 oz (80 kg).Body mass index is 26.05 kg/m.  General Appearance: Fairly Groomed  Eye Contact:  Fair  Speech:  Clear and Coherent  Volume:  Normal  Mood:  Depressed and irritable  Affect:  irritable  Thought Process:  Coherent  Orientation:  Full (Time, Place, and Person)  Thought Content:  Logical Perceptions: CAH of killing self and others, had CAH two days ago  Suicidal Thoughts:  No  Homicidal Thoughts:  No  Memory:  intact  Judgement:  Impaired  Insight:  Lacking  Psychomotor Activity:  Normal  Concentration:  Concentration: Fair and Attention Span: Fair  Recall:  [employer] of Knowledge:  Fair  Language:  Fair  Akathisia:  No  Handed:  Right  AIMS (if indicated):     Assets:  Communication Skills Desire for Improvement  ADL's:  Intact  Cognition:  WNL  Sleep:      Assessment 50 year old male with schizoaffective disorder, bipolar type, cocaine use disorder, alcohol use disorder, hypertension who presented to ED with SI with plan to jump off a bridge and HI without plans with CAH of "killing everyone"for a few days in the setting of cocaine use. (UDS positive for cocaine, EtOH <5 on 07/29/2016)  # Schizoaffective disorder  # r/o substance induced mood disorder Although today's exam is limited given he ended the interview by leaving the room after he was informed of not being prescribed of Tramadol (Will NOT plan to prescribe Tramadol given risk of dependence); he endorses CAH of killing self and others in the interview. He reports limited effect from Abilify, although it is unclear whether he  was adherent to this medication. Will try olanzapine to target his CAH.   # Polysubstance use disorder (cocaine, alcohol) Will place on librium protocol given unknown amount of alcohol use.   Plans - Start olanzapine 2.5 mg BID - Start on librium protocol - - Admit for crisis management and stabilization. - Medication management to reduce current symptoms to base line and improve the patient's overall level of functioning. - Monitor for the adverse effect of the medications and anger outbursts - Continue 15 minutes observation for safety concerns - Encouraged to participate in milieu therapy and group therapy counseling sessions and also work with coping skills -  Develop treatment plan to decrease risk of relapse upon discharge and to reduce the need for readmission. -  Psycho-social education regarding relapse prevention and self care. - Health care follow up as needed for medical problems. - Restart home medications where appropriate.  Treatment Plan Summary: Daily contact with patient to assess and evaluate symptoms and progress in treatment  Observation Level/Precautions:  Detox 15 minute checks  Laboratory:  as needed  Psychotherapy:  Individual and group therapy  Medications:  As above  Consultations:  As needed  Discharge Concerns:  -  Estimated LOS:5-7 days  Other:     I certify that inpatient services furnished can reasonably be expected to improve the patient's condition.    Norman Clay, MD 8/30/20173:39 PM

## 2016-07-30 NOTE — Progress Notes (Signed)
Admission Note:  50 yr old male who presents voluntary, in no acute distress, for the treatment of SI, Depression, and Substance Abuse.  Patient reports SI with a plan to "jump off of a building" and states "And I will do it to".  Patient appears flat and irritable.  Brightens on approach.  Patient was cooperative with admission process. Patient presents with passive SI and contracts for safety upon admission. Patient reports Auditory Hallucinations stating "I hear voices tell me to harm people or to jump off building".  Patient denies Visual Hallucinations.  Patient reports HI and states "towards anybody" and is unsure if provoked or unprovoked.  Patient reports that he was "about ready to act" on suicidal thoughts so he flagged the police down and told them he was having problems and that he needed help".  Patient reports that he is currently homeless and living in shelters.  Patient reports that he has 4 children and states "if I die, God will take care of them".  Patient is unable to identify a support system.  Patient reports previous suicide attempt "sometime this year I took a whole bunch of pills".  Patient reports cocaine and alcohol use daily.  Patient reports medical hx of Asthma and HTN.  While at [facility], patient would like to work on "Getting a place to stay. [facility]" and "Stop using cocaine".  Skin was assessed by security and another RN prior to admission nurse beginning admission.  Patient searched and no contraband found, POC and unit policies explained and understanding verbalized. Consents obtained. Food and fluids offered and accepted. Patient had no additional questions or concerns.

## 2016-07-30 NOTE — BHH Suicide Risk Assessment (Signed)
[facility] Admission Suicide Risk Assessment   Nursing information obtained from:  Patient Demographic factors:  Male, Low socioeconomic status, Living alone, Unemployed, Access to firearms Current Mental Status:  Suicidal ideation indicated by patient, Suicide plan, Plan includes specific time, place, or method, Self-harm thoughts, Self-harm behaviors Loss Factors:  Financial problems / change in socioeconomic status Historical Factors:  Prior suicide attempts, Family history of mental illness or substance abuse Risk Reduction Factors:  NA  Total Time spent with patient: 45 minutes Principal Problem: Schizoaffective disorder, bipolar type ([facility]) Diagnosis:   Patient Active Problem List   Diagnosis Date Noted  . Substance induced mood disorder ([facility]) [F19.94] 07/30/2016  . Schizoaffective disorder, bipolar type ([facility]) [F25.0] 05/31/2016  . Cocaine use disorder, severe, dependence ([facility]) [F14.20] 05/31/2016  . Alcohol use disorder, mild, abuse [F10.10] 05/31/2016  . Essential hypertension [I10] 05/31/2016   Subjective Data:  He states that he lost his mother by suicide at age 21. He has been using drugs and he came here for worsening voices. He hears voices of killing himself, hurting others. He heard voice, last two days ago. He has been using cocaine to let go voices away but reports it worsens afterwards. He has been using cocaine "once in a while," and alcohol "possibly" every day, drinking beers, liquors (does not quantify). Last drink yesterday. He complains about last admission at [facility], stating that nobody was helping him and he was "kicked out of the street." He declines to talk about the past trials of medication, stating that he wants medical opinion from this interviewer, but not the past. He declines to answer [place] of his family history, stating that he does not know about his family. Noted that he left the interview room when he was told he will not be prescribed on tramadol.    Continued  Clinical Symptoms:  Alcohol Use Disorder Identification Test Final Score (AUDIT): 16 The "Alcohol Use Disorders Identification Test", Guidelines for Use in Primary Care, Second Edition.  World Science writer [facility]). Score between 0-7:  no or low risk or alcohol related problems. Score between 8-15:  moderate risk of alcohol related problems. Score between 16-19:  high risk of alcohol related problems. Score 20 or above:  warrants further diagnostic evaluation for alcohol dependence and treatment.   CLINICAL FACTORS:   Schizophrenia:   Command hallucinatons   Musculoskeletal: Strength & Muscle Tone: within normal limits Gait & Station: normal Patient leans: N/A  Psychiatric Specialty Exam: Physical Exam  Nursing note and vitals reviewed. Constitutional: He is oriented to person, place, and time. He appears well-developed and well-nourished.  Neurological: He is alert and oriented to person, place, and time.    Review of Systems  Unable to perform ROS: Mental acuity    Blood pressure (!) 175/107, pulse 68, resp. rate 18, height 5\' 9"  (1.753 m), weight 176 lb 5.9 oz (80 kg).Body mass index is 26.05 kg/m.  General Appearance: Fairly Groomed  Eye Contact:  Fair  Speech:  Clear and Coherent  Volume:  Normal  Mood:  Irritable  Affect:  Restricted, irritable  Thought Process:  Coherent and Goal Directed  Orientation:  Full (Time, Place, and Person)  Thought Content:  Logical Perceptions: CAH of killing self and others  Suicidal Thoughts:  No  Homicidal Thoughts:  No  Memory:  Negative  Judgement:  Impaired  Insight:  Lacking  Psychomotor Activity:  Normal  Concentration:  Concentration: Fair and Attention Span: Fair  Recall:  Negative  Fund of Knowledge:  Fair  Language:  Fair  Akathisia:  No  Handed:  Right  AIMS (if indicated):     Assets:  Communication Skills Desire for Improvement  ADL's:  Intact  Cognition:  WNL  Sleep:         COGNITIVE FEATURES THAT  CONTRIBUTE TO RISK:  Closed-mindedness and Polarized thinking    SUICIDE RISK:   Moderate:  Frequent suicidal ideation with limited intensity, and duration, some specificity in terms of plans, no associated intent, good self-control, limited dysphoria/symptomatology, some risk factors present, and identifiable protective factors, including available and accessible social support.   PLAN OF CARE: Patient will be admitted to inpatient psychiatric unit for stabilization and safety. Will provide and encourage milieu participation. Provide medication management and maked adjustments as needed.  Will follow daily.   I certify that inpatient services furnished can reasonably be expected to improve the patient's condition.  Neysa Hottereina Anuar Walgren, MD 07/30/2016, 3:44 PM

## 2016-07-30 NOTE — Progress Notes (Signed)
Pt accepted to [facility] bed 306-2, attending Dr. [name] Flavors. Report can be called at [phone].  Ilean Skill, MSW, [name] Clinical Social Work, Disposition  07/30/2016 [phone]

## 2016-07-30 NOTE — Tx Team (Signed)
Initial Treatment Plan 07/30/2016 1:48 PM [name] [id number]:[id number]    PATIENT STRESSORS: Substance abuse   PATIENT STRENGTHS: Communication skills Motivation for treatment/growth Physical Health   PATIENT IDENTIFIED PROBLEMS: At risk for suicide  Depression  Substance Abuse  "Getting a place to stay. [facility]"  "Stopping cocaine"             DISCHARGE CRITERIA:  Ability to meet basic life and health needs Adequate post-discharge living arrangements Improved stabilization in mood, thinking, and/or behavior Motivation to continue treatment in a less acute level of care Need for constant or close observation no longer present Withdrawal symptoms are absent or subacute and managed without 24-hour nursing intervention  PRELIMINARY DISCHARGE PLAN: Attend 12-step recovery group Outpatient therapy Placement in alternative living arrangements  PATIENT/FAMILY INVOLVEMENT: This treatment plan has been presented to and reviewed with the patient, [name].  The patient and family have been given the opportunity to ask questions and make suggestions.  Carleene Overlie, RN 07/30/2016, 1:48 PM

## 2016-07-30 NOTE — BH Assessment (Signed)
Tele Assessment Note   [name] is an 50 y.o. male. Pt was admitted to the ED after experiencing thoughts of suicide and homicide. Pt reports he was triggered yesterday because it was his son's birthday and he was not able to see him. Pt reports he has a plan to end his life and "would jump off of a bridge." Pt reports he has had an ongoing battle with Substance abuse and states he used a lot of cocaine yesterday in an attempt to overdose. Pt reports he is currently living at the [place] house and he just received his housing voucher approved for up to $498/month.   Pt reports he also has H/I in which he hears voices and they tell him to do things such as "kill people." Pt reports the voices come at different times without any prior warning. Pt reported experiencing multiple depression symptoms including feelings of worthlessness and guilt, hopelessness, poor appetite in which the pt reports he has lost 10 lbs without trying in the several weeks due to not eating, insomnia, and low self-esteem. Pt reports his mother abused drugs and committed suicide when he was 50 years old. Pt was unable to report when he first began using cocaine and states his mood is up and down and he does not sleep.  Per Donell Sievert, PA pt meets inpatient criteria.  Diagnosis: Major Depressive Disorder,  Substance Abuse Disorder  Past Medical History:  Past Medical History:  Diagnosis Date   Asthma    Depression    Homelessness    Hypertension     History reviewed. No pertinent surgical history.  Family History:  Family History  Problem Relation Age of Onset   Drug abuse Mother    Suicidality Mother     Social History:  reports that he has been smoking Cigarettes.  He has never used smokeless tobacco. He reports that he drinks alcohol. He reports that he uses drugs, including Cocaine.  Additional Social History:  Alcohol / Drug Use Pain Medications: Pt denies abuse, see PTA meds Prescriptions: Pt  denies abuse, see PTA meds Over the Counter: Pt denies abuse, see PTA meds History of alcohol / drug use?: Yes Longest period of sobriety (when/how long): pt reports 6 and a half years  Substance #1 Name of Substance 1: Cocaine 1 - Age of First Use: Pt does not recall 1 - Amount (size/oz): pt reports "a line or two." 1 - Frequency: weekly 1 - Duration: weekly 1 - Last Use / Amount: 07/29/16 Substance #2 Name of Substance 2: Alcohol 2 - Age of First Use: 18 2 - Amount (size/oz): pt reports "sometimes a little, sometimes a lot." 2 - Frequency: occassionally 2 - Last Use / Amount: unknown  CIWA: CIWA-Ar BP: 120/82 Pulse Rate: 85 COWS: Clinical Opiate Withdrawal Scale (COWS) Resting Pulse Rate: Pulse Rate 80 or below Sweating: No report of chills or flushing Restlessness: Able to sit still Pupil Size: Pupils pinned or normal size for room light Bone or Joint Aches: Not present Runny Nose or Tearing: Not present GI Upset: No GI symptoms Tremor: No tremor Yawning: Yawning once or twice during assessment Anxiety or Irritability: None Gooseflesh Skin: Skin is smooth COWS Total Score: 1  PATIENT STRENGTHS: (choose at least two) Capable of independent living Motivation for treatment/growth Work skills  Allergies:  Allergies  Allergen Reactions   Tylenol [Acetaminophen] Nausea And Vomiting    Home Medications:  (Not in a hospital admission)  OB/GYN Status:  No LMP for  male patient.  General Assessment Data Location of Assessment: [facility] ED TTS Assessment: In system Is this a Tele or Face-to-Face Assessment?: Tele Assessment Is this an Initial Assessment or a Re-assessment for this encounter?: Initial Assessment Marital status: Single Is patient pregnant?: No Pregnancy Status: No Living Arrangements: Other (Comment) (pt reports he lives at [employer] ) Can pt return to current living arrangement?: Yes Admission Status: Voluntary Is patient capable of signing voluntary  admission?: Yes Referral Source: Self/Family/Friend Insurance type: Medicaid     Crisis Care Plan Living Arrangements: Other (Comment) (pt reports he lives at [facility] )  Education Status Highest grade of school patient has completed: Unknown  Risk to self with the past 6 months Suicidal Ideation: Yes-Currently Present Has patient been a risk to self within the past 6 months prior to admission? : Yes Suicidal Intent: Yes-Currently Present Has patient had any suicidal intent within the past 6 months prior to admission? : Yes Is patient at risk for suicide?: Yes Suicidal Plan?: Yes-Currently Present Has patient had any suicidal plan within the past 6 months prior to admission? : Yes Specify Current Suicidal Plan: Pt reports he has thought about jumping off of a building Access to Means: Yes Specify Access to Suicidal Means: pt has access to buildings What has been your use of drugs/alcohol within the last 12 months?: pt reports cocaine usage and occassional alcohol  Previous Attempts/Gestures: Yes How many times?:  (unknown) Triggers for Past Attempts: Other (Comment) (pt reports he is triggered by thinking of his children) Intentional Self Injurious Behavior: None Family Suicide History: Yes Recent stressful life event(s): Other (Comment) (pt reports thinking of his son) Persecutory voices/beliefs?: Yes Depression: Yes Depression Symptoms: Insomnia, Guilt, Feeling worthless/self pity, Feeling angry/irritable Substance abuse history and/or treatment for substance abuse?: Yes Suicide prevention information given to non-admitted patients: Not applicable  Risk to Others within the past 6 months Homicidal Ideation: Yes-Currently Present Does patient have any lifetime risk of violence toward others beyond the six months prior to admission? : Yes (comment) Thoughts of Harm to Others: Yes-Currently Present Comment - Thoughts of Harm to Others: pt reports he hears voices that tell  him to kill others Current Homicidal Intent: Yes-Currently Present Current Homicidal Plan: Yes-Currently Present Describe Current Homicidal Plan: pt reports he has thought of how he may kill others Access to Homicidal Means: Yes Describe Access to Homicidal Means: pt has access to other individuals and states he would kill them Identified Victim: pt reports anyone  History of harm to others?: No Assessment of Violence: None Noted Does patient have access to weapons?: No Criminal Charges Pending?: No Does patient have a court date: No Is patient on probation?: No  Psychosis Hallucinations: Auditory Delusions: None noted  Mental Status Report Appearance/Hygiene: In scrubs Eye Contact: Fair Motor Activity: Freedom of movement Speech: Slurred, Incoherent (pt was sometimes incomprehensible) Level of Consciousness: Alert, Drowsy Mood: Despair, Depressed, Guilty, Worthless, low self-esteem Affect: Depressed, Sad, Flat Anxiety Level: None Thought Processes: Relevant, Coherent Judgement: Impaired Orientation: Person, Place, Time, Situation Obsessive Compulsive Thoughts/Behaviors: None  Cognitive Functioning Concentration: Normal Memory: Recent Intact, Remote Impaired IQ: Average Insight: Fair Impulse Control: Fair Appetite: Poor Weight Loss: 10 Sleep: Decreased Total Hours of Sleep: 4 Vegetative Symptoms: None     Prior Inpatient Therapy Prior Inpatient Therapy: Yes Prior Therapy Dates: pt was unsure Prior Therapy Facilty/Provider(s): unknown Reason for Treatment: SA/ SI  Prior Outpatient Therapy Prior Outpatient Therapy: No Does patient have an ACCT team?: No  Does patient have Intensive In-House Services?  : No Does patient have [facility] services? : No Does patient have P4CC services?: No  ADL Screening (condition at time of admission) Is the patient deaf or have difficulty hearing?: No Does the patient have difficulty seeing, even when wearing glasses/contacts?:  No Does the patient have difficulty concentrating, remembering, or making decisions?: No Does the patient have difficulty dressing or bathing?: No Does the patient have difficulty walking or climbing stairs?: No Weakness of Legs: None Weakness of Arms/Hands: None  Home Assistive Devices/Equipment Home Assistive Devices/Equipment: None    Abuse/Neglect Assessment (Assessment to be complete while patient is alone) Physical Abuse: Denies Verbal Abuse: Denies Sexual Abuse: Denies Exploitation of patient/patient's resources: Denies Self-Neglect: Denies     Merchant navy officerAdvance Directives (For Healthcare) Does patient have an advance directive?: No Would patient like information on creating an advanced directive?: No - patient declined information    Additional Information 1:1 In Past 12 Months?: No CIRT Risk: No Elopement Risk: No     Disposition: Per Donell SievertSpencer Simon, PA pt meets inpatient criteria. Disposition Initial Assessment Completed for this Encounter: Yes Disposition of Patient: Inpatient treatment program Type of inpatient treatment program: Adult  Karolee Ohsquicha R Duff 07/30/2016 3:37 AM

## 2016-07-30 NOTE — ED Notes (Signed)
TTS in room speaking with patient.  

## 2016-07-30 NOTE — ED Notes (Signed)
Pt back in room. No distress observed. 

## 2016-07-30 NOTE — ED Notes (Signed)
Patient transported to CT 

## 2016-07-30 NOTE — ED Notes (Addendum)
Pt awakened. New orders discussed. Pt refusing medication. Pt says he is sleeping and does not need medications.

## 2016-07-30 NOTE — ED Notes (Signed)
Patient was given a snack and drink. 

## 2016-07-30 NOTE — BH Assessment (Signed)
Clinician faxed referral to the following inpatient facilities: [facility], [facility], [place], [place], [place], and [place]. Princess Bruins, [name]-A

## 2016-07-30 NOTE — ED Notes (Signed)
Pt refused medication. Stated that medication would make him sleepy. Also refused nicotine patch. Pt said "i don't use that s-it either".

## 2016-07-31 ENCOUNTER — Encounter (HOSPITAL_COMMUNITY): Payer: Self-pay | Admitting: Psychiatry

## 2016-07-31 DIAGNOSIS — F1010 Alcohol abuse, uncomplicated: Secondary | ICD-10-CM | POA: Diagnosis present

## 2016-07-31 DIAGNOSIS — R45851 Suicidal ideations: Secondary | ICD-10-CM

## 2016-07-31 DIAGNOSIS — F250 Schizoaffective disorder, bipolar type: Principal | ICD-10-CM

## 2016-07-31 LAB — GLUCOSE, CAPILLARY: Glucose-Capillary: 89 mg/dL (ref 65–99)

## 2016-07-31 MED ORDER — HALOPERIDOL 5 MG PO TABS: 10.0000 mg | ORAL_TABLET | Freq: Three times a day (TID) | ORAL | Status: DC | PRN

## 2016-07-31 MED ORDER — DIPHENHYDRAMINE HCL 50 MG/ML IJ SOLN: 50.0000 mg | Freq: Four times a day (QID) | INTRAMUSCULAR | Status: DC | PRN

## 2016-07-31 MED ORDER — HALOPERIDOL LACTATE 5 MG/ML IJ SOLN: 10.0000 mg | Freq: Three times a day (TID) | INTRAMUSCULAR | Status: DC | PRN

## 2016-07-31 MED ORDER — DIPHENHYDRAMINE HCL 25 MG PO CAPS
50.0000 mg | ORAL_CAPSULE | Freq: Three times a day (TID) | ORAL | Status: DC | PRN
  Administered 2016-08-03: 50 mg via ORAL
  Filled 2016-07-31: qty 2

## 2016-07-31 MED ORDER — NYSTATIN 100000 UNIT/ML MT SUSP
5.0000 mL | Freq: Four times a day (QID) | OROMUCOSAL | Status: DC
  Administered 2016-07-31 – 2016-08-05 (×12): 500000 [IU] via ORAL
  Filled 2016-07-31 (×32): qty 5

## 2016-07-31 NOTE — Progress Notes (Signed)
The focus of this group is to help patients review their daily goal of treatment and discuss progress on daily workbooks.  Patient attended group and participated. Patient states that his goal was to curse out his doctor and he did. Patient states that his day was a 10/10

## 2016-07-31 NOTE — Tx Team (Signed)
Interdisciplinary Treatment and Diagnostic Plan Update  07/31/2016 Time of Session: 10:52 AM  [name] MRN: [id number]  Principal Diagnosis: Schizoaffective disorder, bipolar type ([place])  Secondary Diagnoses: Principal Problem:   Schizoaffective disorder, bipolar type ([place]) Active Problems:   Substance induced mood disorder ([place])   Current Medications:  Current Facility-Administered Medications  Medication Dose Route Frequency Provider Last Rate Last Dose  . albuterol (PROVENTIL HFA;VENTOLIN HFA) 108 (90 Base) MCG/ACT inhaler 1-2 puff  1-2 puff Inhalation Q6H PRN Norman Clay, MD      . alum & mag hydroxide-simeth (MAALOX/MYLANTA) 200-200-20 MG/5ML suspension 30 mL  30 mL Oral Q4H PRN Nanci Pina, FNP      . chlordiazePOXIDE (LIBRIUM) capsule 25 mg  25 mg Oral Q6H PRN Norman Clay, MD      . chlordiazePOXIDE (LIBRIUM) capsule 25 mg  25 mg Oral QID Norman Clay, MD       Followed by  . chlordiazePOXIDE (LIBRIUM) capsule 25 mg  25 mg Oral TID Norman Clay, MD       Followed by  . [START ON 08/01/2016] chlordiazePOXIDE (LIBRIUM) capsule 25 mg  25 mg Oral BH-qamhs Norman Clay, MD       Followed by  . [START ON 08/03/2016] chlordiazePOXIDE (LIBRIUM) capsule 25 mg  25 mg Oral Daily Norman Clay, MD      . hydrOXYzine (ATARAX/VISTARIL) tablet 25 mg  25 mg Oral Q6H PRN Norman Clay, MD      . lisinopril (PRINIVIL,ZESTRIL) tablet 20 mg  20 mg Oral Daily Norman Clay, MD   20 mg at 07/31/16 1020  . loperamide (IMODIUM) capsule 2-4 mg  2-4 mg Oral PRN Norman Clay, MD      . magnesium hydroxide (MILK OF MAGNESIA) suspension 30 mL  30 mL Oral Daily PRN Nanci Pina, FNP      . multivitamin with minerals tablet 1 tablet  1 tablet Oral Daily Norman Clay, MD      . nicotine (NICODERM CQ - dosed in mg/24 hours) patch 21 mg  21 mg Transdermal Q0600 Nanci Pina, FNP      . OLANZapine (ZYPREXA) tablet 2.5 mg  2.5 mg Oral BID Norman Clay, MD      . ondansetron (ZOFRAN-ODT) disintegrating  tablet 4 mg  4 mg Oral Q6H PRN Norman Clay, MD      . thiamine (B-1) injection 100 mg  100 mg Intramuscular Once Norman Clay, MD      . thiamine (VITAMIN B-1) tablet 100 mg  100 mg Oral Daily Norman Clay, MD      . traZODone (DESYREL) tablet 50 mg  50 mg Oral QHS,MR X 1 Nanci Pina, FNP        PTA Medications: Prescriptions Prior to Admission  Medication Sig Dispense Refill Last Dose  . albuterol (PROVENTIL HFA;VENTOLIN HFA) 108 (90 Base) MCG/ACT inhaler Inhale 2 puffs into the lungs every 4 (four) hours as needed for wheezing or shortness of breath.   Past Month at Unknown time  . ARIPiprazole (ABILIFY) 10 MG tablet Take 1 tablet (10 mg total) by mouth at bedtime. For mood control 30 tablet 0 Past Month at Unknown time  . cyclobenzaprine (FLEXERIL) 10 MG tablet Take 1 tablet (10 mg total) by mouth 2 (two) times daily as needed for muscle spasms. 10 tablet 0 Past Month at Unknown time  . fluticasone (FLONASE) 50 MCG/ACT nasal spray Place 1 spray into both nostrils daily. For allergies  2 Past Month at Unknown time  .  hydrocerin (EUCERIN) CREA Apply 1 application topically 2 (two) times daily. For dry skin  0 Past Month at Unknown time  . lamoTRIgine (LAMICTAL) 25 MG tablet Take 2 tablets (50 mg total) by mouth every evening. For mood stabilization 60 tablet 0 Past Month at Unknown time  . lisinopril (PRINIVIL,ZESTRIL) 20 MG tablet Take 1 tablet (20 mg total) by mouth daily. For high blood pressure 30 tablet 0 Past Month at Unknown time  . magic mouthwash SOLN Take 5 mLs by mouth 4 (four) times daily. For oral thrush  0 Past Month at Unknown time  . traZODone (DESYREL) 100 MG tablet Take 1 tablet (100 mg total) by mouth at bedtime. For sleep 30 tablet 0 Past Month at Unknown time    Treatment Modalities: Medication Management, Group therapy, Case management,  1 to 1 session with clinician, Psychoeducation, Recreational therapy.   Physician Treatment Plan for Primary Diagnosis:  Schizoaffective disorder, bipolar type ([place]) Long Term Goal(s): Improvement in symptoms so as ready for discharge  Short Term Goals: Ability to demonstrate self-control will improve  Medication Management: Evaluate patient's response, side effects, and tolerance of medication regimen.  Therapeutic Interventions: 1 to 1 sessions, Unit Group sessions and Medication administration.  Evaluation of Outcomes: Not Met  Physician Treatment Plan for Secondary Diagnosis: Principal Problem:   Schizoaffective disorder, bipolar type ([place]) Active Problems:   Substance induced mood disorder ([place])   Long Term Goal(s): Improvement in symptoms so as ready for discharge  Short Term Goals: Ability to identify and develop effective coping behaviors will improve  Medication Management: Evaluate patient's response, side effects, and tolerance of medication regimen.  Therapeutic Interventions: 1 to 1 sessions, Unit Group sessions and Medication administration.  Evaluation of Outcomes: Not Met   RN Treatment Plan for Primary Diagnosis: Schizoaffective disorder, bipolar type ([place]) Long Term Goal(s): Knowledge of disease and therapeutic regimen to maintain health will improve  Short Term Goals: Ability to demonstrate self-control and Ability to identify and develop effective coping behaviors will improve  Medication Management: RN will administer medications as ordered by provider, will assess and evaluate patient's response and provide education to patient for prescribed medication. RN will report any adverse and/or side effects to prescribing provider.  Therapeutic Interventions: 1 on 1 counseling sessions, Psychoeducation, Medication administration, Evaluate responses to treatment, Monitor vital signs and CBGs as ordered, Perform/monitor CIWA, COWS, AIMS and Fall Risk screenings as ordered, Perform wound care treatments as ordered.  Evaluation of Outcomes: Not Met   [name] Treatment Plan for Primary  Diagnosis: Schizoaffective disorder, bipolar type ([place]) Long Term Goal(s): Safe transition to appropriate next level of care at discharge, Engage patient in therapeutic group addressing interpersonal concerns.  Short Term Goals: Engage patient in aftercare planning with referrals and resources, Facilitate patient progression through stages of change regarding substance use diagnoses and concerns and Identify triggers associated with mental health/substance abuse issues  Therapeutic Interventions: Assess for all discharge needs, 1 to 1 time with Social worker, Explore available resources and support systems, Assess for adequacy in community support network, Educate family and significant other(s) on suicide prevention, Complete Psychosocial Assessment, Interpersonal group therapy.  Evaluation of Outcomes: Not Met   Progress in Treatment: Attending groups: Yes Participating in groups: Yes Taking medication as prescribed: Yes, MD continues to assess for medication changes as needed Toleration medication: Yes, no side effects reported at this time Family/Significant other contact made:  Patient understands diagnosis: Yes Discussing patient identified problems/goals with staff: Yes Medical problems stabilized or  resolved: Yes Denies suicidal/homicidal ideation: No, patient reported he was experiencing thoughts of suicide and homicide.  Issues/concerns per patient self-inventory: None Other: N/A  New problem(s) identified: None identified at this time.   New Short Term/Long Term Goal(s): None identified at this time.   Discharge Plan or Barriers: CSW will continue to follow for discharge plans.  Reason for Continuation of Hospitalization: Depression Hallucinations Homicidal ideation Medication stabilization Suicidal ideation Substance Abuse   Estimated Length of Stay: 3-5 days  Attendees: Patient: 07/31/2016  10:52 AM  Physician: Dr. Shea Evans 07/31/2016  10:52 AM  Nursing: Edd Arbour, RN   07/31/2016  10:52 AM  RN Care Manager: Lars Pinks 07/31/2016  10:52 AM  Social Worker: Ripley Fraise, [name] 07/31/2016  10:52 AM  Recreational Therapist: Winfield Cunas 07/31/2016  10:52 AM  Other: Radonna Ricker, Social Work Intern  07/31/2016  10:52 AM  Other:  07/31/2016  10:52 AM  Other: 07/31/2016  10:52 AM    Scribe for Treatment Team: Radonna Ricker, Social Work Intern 07/31/2016 10:52 AM

## 2016-07-31 NOTE — Progress Notes (Signed)
[facility] MD Progress Note  07/31/2016 1:50 PM [name]  MRN:  [id number] Subjective: Patient states " Yes I want to kill my self , but I want to know if you are going to help me or not. If not I will just want to be transferred to somewhere , where they can help me. "     Objective:[name] is a 50 y.o. AA male , who is single , on SSD, who presented reporting symptoms of depression and suicidal ideation with a plan to jump off a building.  Patient seen and chart reviewed.Discussed patient with treatment team.  Patient today is labile, irritable , restless. Pt unable to talk to writer - using profanity when spoken to , reports he wants help, but no body is helping. Pt per staff - has been refusing all his medications - will continue to need encouragement and support.    Principal Problem: Schizoaffective disorder, bipolar type (HCC) Diagnosis:   Patient Active Problem List   Diagnosis Date Noted  . Alcohol use disorder, mild, abuse [F10.10] 07/31/2016  . Schizoaffective disorder, bipolar type (HCC) [F25.0] 05/31/2016  . Cocaine use disorder, severe, dependence (HCC) [F14.20] 05/31/2016  . Essential hypertension [I10] 05/31/2016   Total Time spent with patient: 25 minutes  Past Psychiatric History: Please see H&P.   Past Medical History:  Past Medical History:  Diagnosis Date  . Asthma   . Depression   . Homelessness   . Hypertension    History reviewed. No pertinent surgical history. Family History:  Family History  Problem Relation Age of Onset  . Drug abuse Mother   . Suicidality Mother    Family Psychiatric  History: Please see H&P.  Social History: Please see H&P.  History  Alcohol Use  . Yes    Comment: infrequently     History  Drug Use  . Types: Cocaine    Social History   Social History  . Marital status: Single    Spouse name: N/A  . Number of children: N/A  . Years of education: N/A   Social History Main Topics  . Smoking status:  Current Every Day Smoker    Types: Cigarettes  . Smokeless tobacco: Never Used  . Alcohol use Yes     Comment: infrequently  . Drug use:     Types: Cocaine  . Sexual activity: Not Asked   Other Topics Concern  . None   Social History Narrative  . None   Additional Social History:                         Sleep: did not respond  Appetite:  did not respond  Current Medications: Current Facility-Administered Medications  Medication Dose Route Frequency Provider Last Rate Last Dose  . albuterol (PROVENTIL HFA;VENTOLIN HFA) 108 (90 Base) MCG/ACT inhaler 1-2 puff  1-2 puff Inhalation Q6H PRN [name] Hotter, MD      . alum & mag hydroxide-simeth (MAALOX/MYLANTA) 200-200-20 MG/5ML suspension 30 mL  30 mL Oral Q4H PRN [name] Hayward, FNP      . chlordiazePOXIDE (LIBRIUM) capsule 25 mg  25 mg Oral Q6H PRN [name] Hotter, MD      . chlordiazePOXIDE (LIBRIUM) capsule 25 mg  25 mg Oral QID [name] Hotter, MD       Followed by  . chlordiazePOXIDE (LIBRIUM) capsule 25 mg  25 mg Oral TID [name] Hotter, MD       Followed by  . [START ON  08/01/2016] chlordiazePOXIDE (LIBRIUM) capsule 25 mg  25 mg Oral BH-qamhs [name] Hottereina Hisada, MD       Followed by  . [START ON 08/03/2016] chlordiazePOXIDE (LIBRIUM) capsule 25 mg  25 mg Oral Daily [name] Hottereina Hisada, MD      . diphenhydrAMINE (BENADRYL) capsule 50 mg  50 mg Oral Q8H PRN [name] LongsSaramma Lissette Schenk, MD       Or  . diphenhydrAMINE (BENADRYL) injection 50 mg  50 mg Intramuscular Q6H PRN [name] Montenegro, MD      . haloperidol (HALDOL) tablet 10 mg  10 mg Oral Q8H PRN [name] LongsSaramma Braian Tijerina, MD       Or  . haloperidol lactate (HALDOL) injection 10 mg  10 mg Intramuscular Q8H PRN [name] Al, MD      . hydrOXYzine (ATARAX/VISTARIL) tablet 25 mg  25 mg Oral Q6H PRN [name] Hottereina Hisada, MD      . lisinopril (PRINIVIL,ZESTRIL) tablet 20 mg  20 mg Oral Daily [name] Hottereina Hisada, MD   20 mg at 07/31/16 1020  . loperamide (IMODIUM) capsule 2-4 mg  2-4 mg Oral PRN [name] Hottereina Hisada, MD      .  magnesium hydroxide (MILK OF MAGNESIA) suspension 30 mL  30 mL Oral Daily PRN [name] Haywardakia S Starkes, FNP      . multivitamin with minerals tablet 1 tablet  1 tablet Oral Daily [name] Hottereina Hisada, MD      . nicotine (NICODERM CQ - dosed in mg/24 hours) patch 21 mg  21 mg Transdermal Q0600 [name] Haywardakia S Starkes, FNP      . OLANZapine (ZYPREXA) tablet 2.5 mg  2.5 mg Oral BID [name] Hottereina Hisada, MD      . ondansetron (ZOFRAN-ODT) disintegrating tablet 4 mg  4 mg Oral Q6H PRN [name] Hottereina Hisada, MD      . thiamine (B-1) injection 100 mg  100 mg Intramuscular Once [name] Hottereina Hisada, MD      . thiamine (VITAMIN B-1) tablet 100 mg  100 mg Oral Daily [name] Hottereina Hisada, MD      . traZODone (DESYREL) tablet 50 mg  50 mg Oral QHS,MR X 1 [name] Haywardakia S Starkes, FNP        Lab Results:  Results for orders placed or performed during the hospital encounter of 07/30/16 (from the past 48 hour(s))  Glucose, capillary     Status: None   Collection Time: 07/31/16 10:23 AM  Result Value Ref Range   Glucose-Capillary 89 65 - 99 mg/dL    Blood Alcohol level:  Lab Results  Component Value Date   ETH <5 07/29/2016   ETH 7 (H) 05/29/2016    Metabolic Disorder Labs: Lab Results  Component Value Date   HGBA1C 5.7 (H) 06/02/2016   MPG 117 06/02/2016   Lab Results  Component Value Date   PROLACTIN 9.6 06/02/2016   Lab Results  Component Value Date   CHOL 194 06/02/2016   TRIG 52 06/02/2016   HDL 103 06/02/2016   CHOLHDL 1.9 06/02/2016   VLDL 10 06/02/2016   LDLCALC 81 06/02/2016    Physical Findings: AIMS: Facial and Oral Movements Muscles of Facial Expression: None, normal Lips and Perioral Area: None, normal Jaw: None, normal Tongue: None, normal,Extremity Movements Upper (arms, wrists, hands, fingers): None, normal Lower (legs, knees, ankles, toes): None, normal, Trunk Movements Neck, shoulders, hips: None, normal, Overall Severity Severity of abnormal movements (highest score from questions above): None, normal Incapacitation due to  abnormal movements: None, normal Patient's awareness of abnormal movements (rate only patient's report): No Awareness, Dental Status Current problems  with teeth and/or dentures?: No Does patient usually wear dentures?: No  CIWA:  CIWA-Ar Total: 0 COWS:  COWS Total Score: 7  Musculoskeletal: Strength & Muscle Tone: within normal limits Gait & Station: normal Patient leans: N/A  Psychiatric Specialty Exam: Physical Exam  Nursing note and vitals reviewed.   Review of Systems  Unable to perform ROS: Mental acuity  Psychiatric/Behavioral: Positive for suicidal ideas.  All other systems reviewed and are negative.   Blood pressure (!) 163/93, pulse 75, temperature 98.9 F (37.2 C), temperature source Oral, resp. rate 18, height 5\' 9"  (1.753 m), weight 80 kg (176 lb 5.9 oz).Body mass index is 26.05 kg/m.  General Appearance: Fairly Groomed  Eye Contact:  Fair  Speech:  Pressured  Volume:  Increased  Mood:  Angry, Anxious and Irritable   Affect:  Labile  Thought Process:  Linear  Orientation:  Full (Time, Place, and Person)  Thought Content:  patient appears paranoid about staff here - is angry that no one is helping improving  Suicidal Thoughts:  Yes.  with intent/plan jump of a building    Homicidal Thoughts:  No  Memory:  Immediate;   Fair Recent;   Fair Remote;   Fair  Judgement:  Impaired  Insight:  Shallow  Psychomotor Activity:  Restlessness  Concentration:  Concentration: Fair and Attention Span: Fair  Recall:  [employer] of Knowledge:  Fair  Language:  Fair  Akathisia:  No  Handed:  Right  AIMS (if indicated):     Assets:  Desire for Improvement  ADL's:  Intact  Cognition:  WNL  Sleep:  Number of Hours: 5.5     Treatment Plan Summary:Jkai Schreier is a 50 y.o. AA male , who is single , on SSD, who presented reporting symptoms of depression and suicidal ideation with a plan to jump off a building. Pt with mood lability/irritability, continued SI with hx of  multiple suicide attempts, will need inpatient stay.Pt  continues to have SI. Will continue treatment. Daily contact with patient to assess and evaluate symptoms and progress in treatment and Medication management   Will continue Zyprexa 2.5 mg po bid for mood sx, psychosis.Pt has been refusing all medications. Will continue CIWA/librium protocol for alcohol withdrawal sx. Restarted home medications. Will add  Magic mouth wash for oral sore. Will start Nystatin oral suspension for oral sores. Will continue PRN medications as per agitation protocol. Will continue to monitor vitals ,medication compliance and treatment side effects while patient is here.  Will monitor for medical issues as well as call consult as needed.  CSW will continue  working on disposition. Pt continues to be motivated to go to a substance abuse program but is unable to verbalize appropriately his needs. Pt talks in an angry tone and is often seen as pacing. Patient to participate in therapeutic milieu .   Denny Mccree, MD 07/31/2016, 1:50 PM

## 2016-07-31 NOTE — Clinical Social Work Note (Signed)
Patient has a Geneticist, molecular, Tonna Corner [phone]).  Santa Genera, [name] Lead Clinical Social Worker Phone:  [phone]

## 2016-07-31 NOTE — Progress Notes (Signed)
Recreation Therapy Notes  Date: 07/31/16 Time: 1000 Location: 500 Hall Dayroom  Group Topic: Leisure Education  Goal Area(s) Addresses:  Patient will identify positive leisure activities.  Patient will identify one positive benefit of participation in leisure activities.   Intervention: Construction paper, scissors, glue, colored pencils  Activity: Got Rec?  Patients were to come up with some benefits of leisure.  Patients were to then create a collage of positive leisure activities they like to do or would like to learn to do.  Patients were to use magazines to find pictures to represent the activities they enjoy and glue them to the construction paper.  Education:  Leisure Education, Building control surveyor  Education Outcome: Needs additional education  Clinical Observations/Feedback: Pt did not attend group.   Caroll Rancher, LRT/CTRS   Caroll Rancher A 07/31/2016 11:50 AM
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
## 2016-07-31 NOTE — Progress Notes (Signed)
Patient has been irritable and agitated for the majority of the shift.  Patient has been resistant to care.  Patient states he is not getting the medications that he needs, but refuses all medications. Patient reports AH and positive suicidal ideations, but has not intention on acting on them while in th hospital.    Assess patient for safety, offer medications as prescribed, engage patient in 1:1 staff talks.   Continue to monitor for safety, patient able to contract for safety.
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
## 2016-07-31 NOTE — BHH Group Notes (Signed)
[facility] Group Notes:  (Counselor/Nursing/MHT/Case Management/Adjunct)  07/31/2016 1:15PM  Type of Therapy:  Group Therapy  Participation Level:  Active  Participation Quality:  Appropriate  Affect:  Flat  Cognitive:  Oriented  Insight:  Improving  Engagement in Group:  Limited  Engagement in Therapy:  Limited  Modes of Intervention:  Discussion, Exploration and Socialization  Summary of Progress/Problems: The topic for group was balance in life.  Pt participated in the discussion about when their life was in balance and out of balance and how this feels.  Pt discussed ways to get back in balance and short term goals they can work on to get where they want to be. Invited.  Chose to not attend.   Daryel Gerald B 07/31/2016 3:02 PM

## 2016-07-31 NOTE — Clinical Social Work Note (Signed)
Pt asked to speak w CSW supervisor, met w him in his room on 500 unit.  Pt states he requires a 57 day residential treatment program for substance use disorder.  CSW suggested patient consider [facility] and [facility], pt was unwilling to discuss these options if they were not 90 day residential.  Care coordinator is willing to come to talk w patient in hospital to explain future treatment options.  Pt has has Care Manager through [facility] due to high utilization of ED for medical needs Janett Billow [place] [phone]).  Per care coordinator, pt was hospitalized at [facility] in May 2017 and demanded discharge within one day and felt the discharge plan was not being developed quickly enough.  Care coordinator Phoebe Perch would like to visit and talk w patient on 9/1 to discuss options for aftercare.  Edwyna Shell, [name] Lead Clinical Social Worker Phone:  [phone]

## 2016-08-01 LAB — GLUCOSE, CAPILLARY: Glucose-Capillary: 111 mg/dL — ABNORMAL HIGH (ref 65–99)

## 2016-08-01 MED ORDER — GABAPENTIN 100 MG PO CAPS
200.0000 mg | ORAL_CAPSULE | Freq: Three times a day (TID) | ORAL | Status: DC
  Administered 2016-08-01 – 2016-08-05 (×9): 200 mg via ORAL
  Filled 2016-08-01 (×15): qty 2

## 2016-08-01 MED ORDER — QUETIAPINE FUMARATE 200 MG PO TABS
200.0000 mg | ORAL_TABLET | Freq: Every day | ORAL | Status: DC
  Administered 2016-08-01: 200 mg via ORAL
  Filled 2016-08-01 (×4): qty 1

## 2016-08-01 MED ORDER — TRAMADOL HCL 50 MG PO TABS
50.0000 mg | ORAL_TABLET | Freq: Four times a day (QID) | ORAL | Status: DC | PRN
  Administered 2016-08-01 – 2016-08-04 (×7): 50 mg via ORAL
  Filled 2016-08-01 (×8): qty 1

## 2016-08-01 NOTE — Progress Notes (Signed)
  Attempted to evaluate patient this AM. Patient got up and answered few questions and all of a sudden asked writer to wait for a second and rushed out of the room. Writer waited for patient to come back in the hallway . Pt went to the nurses station and got a male tech and when the male tech came to the room - asked for a new doctor - stated " I do not want to see her."  Called [facility] Mardella Layman - discussed patient's concern. AC to get another provider to see patient today.  Jomarie Longs ,MD Attending Psychiatrist  [facility]

## 2016-08-01 NOTE — Progress Notes (Signed)
Patient ID: [name], male   DOB: [birth year], 50 y.o.   MRN: [id number]   Pt currently presents with a flat affect and irritable, aggressive behavior. Pt states "I haven't been sleeping well but I'm not taking anything until I talk to the doctor." Pt requests to speak with Dr. Lucianne Muss. Pt behavior is manipulative, attitude is negative and he says sexually inappropriate things to other pts including "you could suck a bag of dicks." Pt reports poor sleep with current medication regimen. Pt paces the halls around 2am.   Pt provided with medications per providers orders. Pt's labs and vitals were monitored throughout the night. Pt supported emotionally and encouraged to express concerns and questions. Pt educated on medications. Pt reoriented to rules on the unit. Karleen Hampshire, PA consulted to speak with patient, see MAR.  Pt's safety ensured with 15 minute and environmental checks. Pt currently denies SI/HI and A/V hallucinations. Pt verbally agrees to seek staff if SI/HI or A/VH occurs and to consult with staff before acting on any harmful thoughts. Will continue POC.

## 2016-08-01 NOTE — Progress Notes (Signed)
Recreation Therapy Notes  Date: 08/01/16 Time: 1000 Location: 500 Hall Dayroom  Group Topic: Stress Management  Goal Area(s) Addresses:  Patient will verbalize importance of using healthy stress management.  Patient will identify positive emotions associated with healthy stress management.   Intervention: Stress Management  Activity :  Progressive Muscle Relaxation, Guided Imagery Script.  LRT introduced the stress management techniques of progressive muscle relaxation and guided imagery.  LRT read scripts that guided patients in how to perform the techniques.  Patients were to follow along as LRT read the scripts to engage in the techniques.  Education:  Stress Management, Discharge Planning.   Education Outcome: Needs additional education  Clinical Observations/Feedback: Pt did not attend group.   Caroll Rancher, LRT/CTRS    Caroll Rancher A 08/01/2016 12:37 PM

## 2016-08-01 NOTE — Progress Notes (Signed)
Spoke with Clinical research associate this evening and asked him what his issues were with not taking his medications. Pt stated he talked to various Doctors and did not get his issues resolved and he was waiting to talk to Dr. Lucianne Muss. Pt was informed that PA Karleen Hampshire) was on tonight and I could have him talk and see if we could get to some resolution. Pt agreed and pt did agree to Take (Neurotin, Seroquel , and Ultram) . Pt informed us that he has used Seroquel in the past and it has helped him with the voices, but the dose he was on was to high and did not like feeling like a Zombie, so he stated he would be willing to try to find a dose that works for him and was willing to give the Neurontin a chance after some education was reinforced.

## 2016-08-01 NOTE — Progress Notes (Signed)
DAR NOTE: Patient appears irritable and agitated during assessment.  Patient observed pacing back and forth on the unit.  Patient continues to request and demand to see a different doctor.  Patient states "these doctors don't want to give me what I need for treatment." Denies pain, auditory and visual hallucinations. Maintained on routine safety checks.  Refused all most medications except lisinopril and Vitamins.  Support and encouragement offered as needed.  Patient interacting with peers at times in the dayroom.

## 2016-08-01 NOTE — Progress Notes (Signed)
Patient ID: [name], male   DOB: Aug 17, 1966, 50 y.o.   MRN: [id number]  Informed by the nurse that patient needs to be evaluated by a provider as he refused to see [name] this morning. Per report, he declined to see me either (this Clinical research associate evaluated the patient on admission). Will defer the evaluation to another provider.

## 2016-08-01 NOTE — BHH Group Notes (Signed)
BHH [name] Group Therapy  08/01/2016  1:05 PM  Type of Therapy:  Group therapy  Participation Level:  Active  Participation Quality:  Attentive  Affect:  Flat  Cognitive:  Oriented  Insight:  Limited  Engagement in Therapy:  Limited  Modes of Intervention:  Discussion, Socialization  Summary of Progress/Problems:  Chaplain was here to lead a group on themes of hope and courage.  Invited.  Chose to not attend.  Daryel Gerald B 08/01/2016 12:45 PM

## 2016-08-01 NOTE — Progress Notes (Signed)
[name] had been up and visible in milieu this evening, did attend and participate in evening group activity. [name] spoke about how he is to speak to the director in the morning time and spoke about how he is not taking any medications while he is here and spoke about how the doctor is not listening to him. [name] was agitated this evening but did respond appropriately to staff re-direction. A. Support and encouragement provided. R. Safety maintained, will continue to monitor.

## 2016-08-02 ENCOUNTER — Other Ambulatory Visit: Payer: Self-pay

## 2016-08-02 MED ORDER — HYDROCERIN EX CREA
TOPICAL_CREAM | Freq: Two times a day (BID) | CUTANEOUS | Status: DC
  Administered 2016-08-03: 22:00:00 via TOPICAL
  Administered 2016-08-05: 1 via TOPICAL
  Filled 2016-08-02: qty 113

## 2016-08-02 MED ORDER — NICOTINE 21 MG/24HR TD PT24
21.0000 mg | MEDICATED_PATCH | Freq: Every day | TRANSDERMAL | Status: DC
  Filled 2016-08-02 (×7): qty 1

## 2016-08-02 MED ORDER — NICOTINE 21 MG/24HR TD PT24: 21.0000 mg | MEDICATED_PATCH | Freq: Every day | TRANSDERMAL | Status: DC

## 2016-08-02 NOTE — BHH Group Notes (Signed)
B[facility] [name] Group Therapy  08/02/2016 / 11:00 AM  Type of Therapy:  Group Therapy  Participation Level:  Did Not Attend. CSW unable to locate pt prior to group; believe pt may have been meeting with PA.   Clide Dales

## 2016-08-02 NOTE — Progress Notes (Signed)
[facility] MD Progress Note  08/02/2016 11:10 AM [name]  MRN:  [id number] Subjective:  Patient reports " I am not trying to disrespectful. I need my pain medication increased."  Objective: [name] is awake, alert and oriented X4. Seen walking the unit interacting with peers. Patient reports suicidal ideation reports voice is "telling me to jump off a bridge." Patient denies homicidal ideation. Reports auditory and visual hallucination when he is off is mediations.Patient doesn't appear to be responding to internal stimuli. Patient reports he is medication compliant without mediation side effects.  Patient has multiple request to get "things together with the social worker." States his depression 8/10. Patient reports fair appetite and reports  resting well. Support, encouragement and reassurance was provided.   Principal Problem: Schizoaffective disorder, bipolar type (HCC) Diagnosis:   Patient Active Problem List   Diagnosis Date Noted  . Alcohol use disorder, mild, abuse [F10.10] 07/31/2016  . Schizoaffective disorder, bipolar type (HCC) [F25.0] 05/31/2016  . Cocaine use disorder, severe, dependence (HCC) [F14.20] 05/31/2016  . Essential hypertension [I10] 05/31/2016   Total Time spent with patient: 30 minutes  Past Psychiatric History: See Above   Past Medical History:  Past Medical History:  Diagnosis Date  . Asthma   . Depression   . Homelessness   . Hypertension    History reviewed. No pertinent surgical history. Family History:  Family History  Problem Relation Age of Onset  . Drug abuse Mother   . Suicidality Mother    Family Psychiatric  History: See above Social History:  History  Alcohol Use  . Yes    Comment: infrequently     History  Drug Use  . Types: Cocaine    Social History   Social History  . Marital status: Single    Spouse name: N/A  . Number of children: N/A  . Years of education: N/A   Social History Main Topics  . Smoking status:  Current Every Day Smoker    Types: Cigarettes  . Smokeless tobacco: Never Used  . Alcohol use Yes     Comment: infrequently  . Drug use:     Types: Cocaine  . Sexual activity: Not Asked   Other Topics Concern  . None   Social History Narrative  . None   Additional Social History:                         Sleep: Fair  Appetite:  Fair  Current Medications: Current Facility-Administered Medications  Medication Dose Route Frequency Provider Last Rate Last Dose  . albuterol (PROVENTIL HFA;VENTOLIN HFA) 108 (90 Base) MCG/ACT inhaler 1-2 puff  1-2 puff Inhalation Q6H PRN Neysa Hotter, MD   2 puff at 07/31/16 1854  . alum & mag hydroxide-simeth (MAALOX/MYLANTA) 200-200-20 MG/5ML suspension 30 mL  30 mL Oral Q4H PRN Truman Hayward, FNP      . chlordiazePOXIDE (LIBRIUM) capsule 25 mg  25 mg Oral Q6H PRN Neysa Hotter, MD      . chlordiazePOXIDE (LIBRIUM) capsule 25 mg  25 mg Oral BH-qamhs Neysa Hotter, MD       Followed by  . [START ON 08/03/2016] chlordiazePOXIDE (LIBRIUM) capsule 25 mg  25 mg Oral Daily Neysa Hotter, MD      . diphenhydrAMINE (BENADRYL) capsule 50 mg  50 mg Oral Q8H PRN Saramma Eappen, MD       Or  . diphenhydrAMINE (BENADRYL) injection 50 mg  50 mg Intramuscular Q6H PRN Saramma Eappen,  MD      . gabapentin (NEURONTIN) capsule 200 mg  200 mg Oral TID Kerry Hough, PA-C   200 mg at 08/02/16 0800  . haloperidol (HALDOL) tablet 10 mg  10 mg Oral Q8H PRN Jomarie Longs, MD       Or  . haloperidol lactate (HALDOL) injection 10 mg  10 mg Intramuscular Q8H PRN Saramma Eappen, MD      . hydrOXYzine (ATARAX/VISTARIL) tablet 25 mg  25 mg Oral Q6H PRN Neysa Hotter, MD      . lisinopril (PRINIVIL,ZESTRIL) tablet 20 mg  20 mg Oral Daily Neysa Hotter, MD   20 mg at 08/02/16 0813  . loperamide (IMODIUM) capsule 2-4 mg  2-4 mg Oral PRN Neysa Hotter, MD      . magnesium hydroxide (MILK OF MAGNESIA) suspension 30 mL  30 mL Oral Daily PRN Truman Hayward, FNP      .  multivitamin with minerals tablet 1 tablet  1 tablet Oral Daily Neysa Hotter, MD   1 tablet at 08/02/16 0813  . nicotine (NICODERM CQ - dosed in mg/24 hours) patch 21 mg  21 mg Transdermal Q0600 Kerry Hough, PA-C      . nystatin (MYCOSTATIN) 100000 UNIT/ML suspension 500,000 Units  5 mL Oral QID [name], NP   500,000 Units at 08/02/16 [phone]  . ondansetron (ZOFRAN-ODT) disintegrating tablet 4 mg  4 mg Oral Q6H PRN Neysa Hotter, MD      . QUEtiapine (SEROQUEL) tablet 200 mg  200 mg Oral QHS Kerry Hough, PA-C   200 mg at 08/01/16 2107  . thiamine (B-1) injection 100 mg  100 mg Intramuscular Once Neysa Hotter, MD      . thiamine (VITAMIN B-1) tablet 100 mg  100 mg Oral Daily Neysa Hotter, MD   100 mg at 08/02/16 0813  . traMADol (ULTRAM) tablet 50 mg  50 mg Oral Q6H PRN Kerry Hough, PA-C   50 mg at 08/02/16 5409    Lab Results:  Results for orders placed or performed during the hospital encounter of 07/30/16 (from the past 48 hour(s))  Glucose, capillary     Status: Abnormal   Collection Time: 08/01/16  8:54 AM  Result Value Ref Range   Glucose-Capillary 111 (H) 65 - 99 mg/dL    Blood Alcohol level:  Lab Results  Component Value Date   ETH <5 07/29/2016   ETH 7 (H) 05/29/2016    Metabolic Disorder Labs: Lab Results  Component Value Date   HGBA1C 5.7 (H) 06/02/2016   MPG 117 06/02/2016   Lab Results  Component Value Date   PROLACTIN 9.6 06/02/2016   Lab Results  Component Value Date   CHOL 194 06/02/2016   TRIG 52 06/02/2016   HDL 103 06/02/2016   CHOLHDL 1.9 06/02/2016   VLDL 10 06/02/2016   LDLCALC 81 06/02/2016    Physical Findings: AIMS: Facial and Oral Movements Muscles of Facial Expression: None, normal Lips and Perioral Area: None, normal Jaw: None, normal Tongue: None, normal,Extremity Movements Upper (arms, wrists, hands, fingers): None, normal Lower (legs, knees, ankles, toes): None, normal, Trunk Movements Neck, shoulders, hips: None,  normal, Overall Severity Severity of abnormal movements (highest score from questions above): None, normal Incapacitation due to abnormal movements: None, normal Patient's awareness of abnormal movements (rate only patient's report): No Awareness, Dental Status Current problems with teeth and/or dentures?: No Does patient usually wear dentures?: No  CIWA:  CIWA-Ar Total: 4 COWS:  COWS  Total Score: 7  Musculoskeletal: Strength & Muscle Tone: within normal limits Gait & Station: normal Patient leans: N/A  Psychiatric Specialty Exam: Physical Exam  Nursing note and vitals reviewed. Constitutional: He is oriented to person, place, and time. He appears well-developed and well-nourished.  Musculoskeletal: Normal range of motion.  Neurological: He is alert and oriented to person, place, and time.  Psychiatric: He has a normal mood and affect. His behavior is normal.    Review of Systems  Musculoskeletal: Positive for back pain.  Psychiatric/Behavioral: Positive for depression, hallucinations, substance abuse and suicidal ideas. The patient is nervous/anxious and has insomnia.   All other systems reviewed and are negative.   Blood pressure (!) 161/92, pulse 75, temperature 98.2 F (36.8 C), temperature source Oral, resp. rate 17, height 5\' 9"  (1.753 m), weight 80 kg (176 lb 5.9 oz).Body mass index is 26.05 kg/m.  General Appearance: Casual paper scrubs   Eye Contact:  Good  Speech:  Clear and Coherent rapid and pressured   Volume:  Increased  Mood:  Anxious and Euphoric  Affect:  Appropriate  Thought Process:  Coherent  Orientation:  Full (Time, Place, and Person)  Thought Content:  Hallucinations: Auditory  Suicidal Thoughts:  Yes.  with intent/plan Patient is able to contract for safe while on the unit  Homicidal Thoughts:  No  Memory:  Immediate;   Fair Recent;   Fair Remote;   Fair  Judgement:  Fair  Insight:  Lacking  Psychomotor Activity:  Restlessness  Concentration:   Concentration: Poor  Recall:  Fair  Fund of Knowledge:  Good  Language:  Good  Akathisia:  No  Handed:  Right  AIMS (if indicated):     Assets:  Communication Skills Desire for Improvement Financial Resources/Insurance Resilience Social Support  AL's:  Intact  Cognition:  WNL  Sleep:  Number of Hours: 4.25     I agree with current treatment plan on 08/02/2016, Patient seen face-to-face for psychiatric evaluation follow-up, chart reviewed. Reviewed the information documented and agree with the treatment plan.  Treatment Plan Summary: Daily contact with patient to assess and evaluate symptoms and progress in treatment and Medication management  -EKG orders placed discontinue Seroquel 200 mg PO QHS due to prolonged QT478 - Contiune olanzapine 2.5 mg BID - continue on librium protocol - - Admit for crisis management and stabilization. - Medication management to reduce current symptoms to base line and improve the patient's overall level of functioning. - Monitor for the adverse effect of the medications and anger outbursts - Continue 15 minutes observation for safety concerns - Encouraged to participate in milieu therapy and group therapy counseling sessions and also work with coping skills -  Develop treatment plan to decrease risk of relapse upon discharge and to reduce the need for readmission. -  Psycho-social education regarding relapse prevention and self care. - Health care follow up as needed for medical problems. - Continue  home medications where appropriate.  Oneta Rackanika N Odies Desa, NP 08/02/2016, 11:10 AM

## 2016-08-02 NOTE — Progress Notes (Signed)
DAR NOTE: Patient presents with irritable mood and affect.  Denies auditory and visual hallucinations.  Rates depression at 10, hopelessness at 10, and anxiety at 0.  Maintained on routine safety checks.  Took all prescribed medication except for Librium.  Support and encouragement offered as needed.  Patient complain of headache and generalized discomfort.  patient requested and received Tramadol 50 mg with good effect.  Patient continues to be loud and verbally aggressive with staff on the unit.  Patient pacing back and forth on the unit while talking to himself.  Patient became irritated because staff when to adjust the TV volume because other patient complain about TV being too loud.

## 2016-08-02 NOTE — Plan of Care (Signed)
Problem: Safety: Goal: Periods of time without injury will increase Outcome: Progressing Pt remains free from harm 08/02/16 3:03 AM

## 2016-08-03 MED ORDER — BENZOCAINE 10 % MT GEL
Freq: Every day | OROMUCOSAL | Status: DC | PRN
  Administered 2016-08-03: 1 via OROMUCOSAL
  Administered 2016-08-03: 22:00:00 via OROMUCOSAL
  Filled 2016-08-03: qty 9.4

## 2016-08-03 MED ORDER — LISINOPRIL 20 MG PO TABS
20.0000 mg | ORAL_TABLET | Freq: Every day | ORAL | Status: DC
Start: 2016-08-03 — End: 2016-08-04
  Administered 2016-08-03: 20 mg via ORAL
  Filled 2016-08-03 (×2): qty 1

## 2016-08-03 MED ORDER — BENZOCAINE 10 % MT GEL
Freq: Four times a day (QID) | OROMUCOSAL | Status: DC | PRN
  Administered 2016-08-03 (×3): via OROMUCOSAL
  Filled 2016-08-03 (×2): qty 9.4

## 2016-08-03 MED ORDER — LISINOPRIL 40 MG PO TABS
40.0000 mg | ORAL_TABLET | Freq: Every day | ORAL | Status: DC
  Administered 2016-08-04 – 2016-08-06 (×3): 40 mg via ORAL
  Filled 2016-08-03 (×5): qty 1

## 2016-08-03 MED ORDER — TRAMADOL HCL 50 MG PO TABS
100.0000 mg | ORAL_TABLET | Freq: Once | ORAL | Status: AC
  Administered 2016-08-03: 100 mg via ORAL
  Filled 2016-08-03: qty 2

## 2016-08-03 MED ORDER — CLOTRIMAZOLE 1 % EX CREA
TOPICAL_CREAM | Freq: Two times a day (BID) | CUTANEOUS | Status: DC
  Administered 2016-08-05: 1 via TOPICAL
  Filled 2016-08-03: qty 15

## 2016-08-03 NOTE — Progress Notes (Addendum)
Patient has refused morning medication 3 times today.  Patient did take his lisinopril medication and pain medication because of dental pain.  Patient refused morning vital signs and also 1:00 p.m. Vital signs which NP had instructed nurse to do after lunch.  Patient stated he was eating and stated NP told him vital signs were to be at 1:00 p.m.  MHTs and RN informed.  Patient upset about television channels, etc.  Charge nurse talking to patient.

## 2016-08-03 NOTE — Progress Notes (Addendum)
D:  Patient refused to fill out self inventory sheet today.  Patient denied SI and HI.  Denied A/V hallucinations.   A:  Patient refused medications this morning, did take lisinopril and pain medications.   R:  Safety maintained with 15 minute checks.  Patient irritable, angry over television, refused medications and refused vital signs.  Patient stated several times that he will talk to management about staff on Tuesday after holiday.  Patient talking loudly in day room telling peers what he is going to do and not do concerning his medications and vital signs.  Respirations even and unlabored.  No signs/symptoms of pain/distress noted on patient's face/body movements.  Safety maintained with 15 minute checks.
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
## 2016-08-03 NOTE — Progress Notes (Signed)
Patient ID: [name], male   DOB: [birth year], 50 y.o.   MRN: [id number]  Charge RN Note- Writer was notified by MHT [name] that patient would like to speak to A/C Inetta Fermo. Due to chain of command, writer went to speak with patient first. Auri appeared upset reporting he felt like no one was willing to speak to him about his tooth pain yesterday or today. Writer asked him about his pain and which provider saw him yesterday in order for writer to follow up. Patient automatically became agitated. He asked to see the A/C. Writer removed self from the situation as it was escalating patient. A/C Inetta Fermo was notified that patient requested to speak with her. A/C Inetta Fermo acknowledged and stated she would see him then.

## 2016-08-03 NOTE — Plan of Care (Signed)
Problem: Education: Goal: Utilization of techniques to improve thought processes will improve Outcome: Not Progressing Nurse attempted several times throughout day to talk to patient about depression/anxiety/coping skills and patient would not listed to nurse.
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
## 2016-08-03 NOTE — [facility] Group Notes (Signed)
[facility] [name] Group Therapy  08/03/2016 11:30 AM until Noon  Type of Therapy:  Group Therapy  Participation Level:  Did Not Attend despite encouragement from staff   Carney Bern, [name]
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
## 2016-08-03 NOTE — Progress Notes (Signed)
Patient did not attend wrap up group. 

## 2016-08-03 NOTE — [facility] Group Notes (Signed)
The focus of this group is to educate the patient on the purpose and policies of crisis stabilization and provide a format to answer questions about their admission.  The group details unit policies and expectations of patients while admitted.  Patient did not attend 0900 nurse education orientation group this morning.  Patient stayed in bed.   

## 2016-08-04 MED ORDER — KETOROLAC TROMETHAMINE 10 MG PO TABS
10.0000 mg | ORAL_TABLET | Freq: Three times a day (TID) | ORAL | Status: DC | PRN
  Administered 2016-08-04 – 2016-08-06 (×4): 10 mg via ORAL
  Filled 2016-08-04 (×4): qty 1

## 2016-08-04 MED ORDER — KETOROLAC TROMETHAMINE 30 MG/ML IJ SOLN
60.0000 mg | Freq: Once | INTRAMUSCULAR | Status: AC
  Administered 2016-08-04: 60 mg via INTRAMUSCULAR
  Filled 2016-08-04 (×2): qty 2

## 2016-08-04 MED ORDER — AMOXICILLIN 500 MG PO CAPS
500.0000 mg | ORAL_CAPSULE | Freq: Two times a day (BID) | ORAL | Status: DC
  Administered 2016-08-04 – 2016-08-06 (×4): 500 mg via ORAL
  Filled 2016-08-04 (×7): qty 1

## 2016-08-04 NOTE — Progress Notes (Signed)
Patient has been irritable this evening with patients and staff. He has been c/o tooth pain and was informed of when his ultram was available next. He requested benadryl to help him to sleep and tolerate his tooth pain until next dose of ultram. Patient later came and requested to speak to writer about his pain and requested to be sent out. He became upset when Clinical research associate informed him that it would be unlikely for him to be sent out and would call NP on call for an order. He requested to talk with Katheran Awe RN about the same issue. Writer offered him ultram and oragel before NP was called and he refused it reporting that it does not help and he will just suffer. Writer informed NP on call of the situation and how the patient was becoming increasingly agitated even after talking with Katheran Awe RN on the hall with Buyer, retail. Writer received a one time order for toradol which he received. Writer reassessed patient later and he appeared to be asleep. Safety maintained on unit, will continue to monitor.

## 2016-08-04 NOTE — Progress Notes (Addendum)
Patient ID: [name], male   DOB: Aug 29, 1966, 50 y.o.   MRN: [id number]  Pt currently presents with an animated  affect and labile behavior. Pt speech is loud and he curses on the unit. Pt requests for tennis shoes out of locker, see belonging sheet. Pt refuses all medication, requests pain medication only for tooth pain. Pt interacts positively with peers on the unit, is often dominant of the conversation.   Pt provided with medications per providers orders. Pt's labs and vitals were monitored throughout the night. Pt supported emotionally and encouraged to express concerns and questions. Pt educated on medications. Pt offered heat therapy for pain.   Pt's safety ensured with 15 minute and environmental checks. Pt currently denies SI/HI and A/V hallucinations. Pt verbally agrees to seek staff if SI/HI or A/VH occurs and to consult with staff before acting on any harmful thoughts. Will continue POC.  Pts systolic pressure trending high, 173 this am. Pt states "I'm good, I have high blood pressure. I'm not taking no medicine." Denies any signs or symptoms, will pass on to oncoming RN.

## 2016-08-04 NOTE — Progress Notes (Signed)
Patient has refused most of his scheduled medications today.  Patient has been upset throughout the day with staff about his medications, television channels, et.  Patient has talked to charge nurse and nurses about his problems.  MD and NP aware of patient's behavior.  Respirations even and unlabored.  No signs/symptoms of pain/distress noted on patient's face/body movements.  Safety maintained with 15 minute checks.

## 2016-08-04 NOTE — Progress Notes (Signed)
Patient slept late this morning.  Patient has refused medications 3 times this morning.  Patient refused VS to be taken by nurse.  MHT took VS.  Patient has been drinking coffee, laughing, playing cards, talking to MHT.  No signs of pain/distress noted on patient's face/body movements.  Respirations even and unlabored.  No signs of dental pain.  Safety maintained with 15 minute checks.

## 2016-08-04 NOTE — Progress Notes (Signed)
Recreation Therapy Notes  Date: 08/04/16 Time: 1000 Location: 500 Hall Dayroom  Group Topic: Wellness  Goal Area(s) Addresses:  Patient will define components of whole wellness. Patient will verbalize benefit of whole wellness.  Intervention:  [employer], chairs  Activity: Keep it Contractor.  LRT arranged the chairs in a circle.  Patients were to sit around the circle and pass the ball back and forth as the LRT kept count of the number of times the ball was hit.  The ball could bounce off of the floor but it could not roll to a stop.  If the ball came to a stop, the count would start over.  Education:Wellness, Discharge Planning.   Education Outcome: Needs additional education.   Clinical Observations/Feedback: Pt did not attend group.   Caroll Rancher, LRT/CTRS     Caroll Rancher A 08/04/2016 11:49 AM

## 2016-08-04 NOTE — Progress Notes (Signed)
[facility] MD Progress Note  08/04/2016 3:16 PM [name]  MRN:  [id number] Subjective:  Patient reports " I am feeling okay I guess." - still endorsing tooth pain  Objective: Artur Pupillo is awake, alert and oriented X4. Seen resting in bedroom talking to staff. Patient reports he spoke to admission person for  [facility] , and was advised that once his suicidal ideation are better managed he will be able to come to the [facility] treatment facility.   Per staff notes patient is still irritable and agitated with peers and staff.  Patient is still endorsing suicidal ideation reports voice is "telling me to jump off a bridge." Patient denies homicidal ideation. Patient reports he is no longer interested in working with SW or Psychiatrist. Reports auditory and visual hallucination when he is off is mediations. patient is denying any hallucination today.Patient doesn't appear to be responding to internal stimuli. Per staff notes patient is refusing all medications at this time. States his depression 10/10. Patient reports fair appetite and reports he rested well on last night. Support, encouragement and reassurance was provided.   Principal Problem: Schizoaffective disorder, bipolar type (HCC) Diagnosis:   Patient Active Problem List   Diagnosis Date Noted  . Alcohol use disorder, mild, abuse [F10.10] 07/31/2016  . Schizoaffective disorder, bipolar type (HCC) [F25.0] 05/31/2016  . Cocaine use disorder, severe, dependence (HCC) [F14.20] 05/31/2016  . Essential hypertension [I10] 05/31/2016   Total Time spent with patient: 30 minutes  Past Psychiatric History: See Above   Past Medical History:  Past Medical History:  Diagnosis Date  . Asthma   . Depression   . Homelessness   . Hypertension    History reviewed. No pertinent surgical history. Family History:  Family History  Problem Relation Age of Onset  . Drug abuse Mother   . Suicidality Mother    Family Psychiatric  History: See above Social  History:  History  Alcohol Use  . Yes    Comment: infrequently     History  Drug Use  . Types: Cocaine    Social History   Social History  . Marital status: Single    Spouse name: N/A  . Number of children: N/A  . Years of education: N/A   Social History Main Topics  . Smoking status: Current Every Day Smoker    Types: Cigarettes  . Smokeless tobacco: Never Used  . Alcohol use Yes     Comment: infrequently  . Drug use:     Types: Cocaine  . Sexual activity: Not Asked   Other Topics Concern  . None   Social History Narrative  . None   Additional Social History:                         Sleep: Fair  Appetite:  Fair  Current Medications: Current Facility-Administered Medications  Medication Dose Route Frequency Provider Last Rate Last Dose  . albuterol (PROVENTIL HFA;VENTOLIN HFA) 108 (90 Base) MCG/ACT inhaler 1-2 puff  1-2 puff Inhalation Q6H PRN Neysa Hotter, MD   2 puff at 07/31/16 1854  . alum & mag hydroxide-simeth (MAALOX/MYLANTA) 200-200-20 MG/5ML suspension 30 mL  30 mL Oral Q4H PRN Truman Hayward, FNP      . benzocaine (ORAJEL) 10 % mucosal gel   Mouth/Throat Daily PRN Oneta Rack, NP      . clotrimazole (LOTRIMIN) 1 % cream   Topical BID Oneta Rack, NP      . diphenhydrAMINE (  BENADRYL) capsule 50 mg  50 mg Oral Q8H PRN Jomarie LongsSaramma Eappen, MD   50 mg at 08/03/16 2334   Or  . diphenhydrAMINE (BENADRYL) injection 50 mg  50 mg Intramuscular Q6H PRN Jomarie LongsSaramma Eappen, MD      . gabapentin (NEURONTIN) capsule 200 mg  200 mg Oral TID Kerry HoughSpencer E Simon, PA-C   200 mg at 08/04/16 1132  . haloperidol (HALDOL) tablet 10 mg  10 mg Oral Q8H PRN Jomarie LongsSaramma Eappen, MD       Or  . haloperidol lactate (HALDOL) injection 10 mg  10 mg Intramuscular Q8H PRN Saramma Eappen, MD      . hydrocerin (EUCERIN) cream   Topical BID Oneta Rackanika N Lewis, NP      . lisinopril (PRINIVIL,ZESTRIL) tablet 40 mg  40 mg Oral Daily Oneta Rackanika N Lewis, NP   40 mg at 08/04/16 1133  . magnesium  hydroxide (MILK OF MAGNESIA) suspension 30 mL  30 mL Oral Daily PRN Truman Haywardakia S Starkes, FNP      . multivitamin with minerals tablet 1 tablet  1 tablet Oral Daily Neysa Hottereina Hisada, MD   1 tablet at 08/02/16 0813  . nicotine (NICODERM CQ - dosed in mg/24 hours) patch 21 mg  21 mg Transdermal Q0600 Kerry HoughSpencer E Simon, PA-C      . nystatin (MYCOSTATIN) 100000 UNIT/ML suspension 500,000 Units  5 mL Oral QID Sanjuana KavaAgnes I Nwoko, NP   500,000 Units at 08/03/16 2216  . thiamine (B-1) injection 100 mg  100 mg Intramuscular Once Neysa Hottereina Hisada, MD      . thiamine (VITAMIN B-1) tablet 100 mg  100 mg Oral Daily Neysa Hottereina Hisada, MD   100 mg at 08/02/16 0813  . traMADol (ULTRAM) tablet 50 mg  50 mg Oral Q6H PRN Kerry HoughSpencer E Simon, PA-C   50 mg at 08/04/16 1139    Lab Results:  No results found for this or any previous visit (from the past 48 hour(s)).  Blood Alcohol level:  Lab Results  Component Value Date   ETH <5 07/29/2016   ETH 7 (H) 05/29/2016    Metabolic Disorder Labs: Lab Results  Component Value Date   HGBA1C 5.7 (H) 06/02/2016   MPG 117 06/02/2016   Lab Results  Component Value Date   PROLACTIN 9.6 06/02/2016   Lab Results  Component Value Date   CHOL 194 06/02/2016   TRIG 52 06/02/2016   HDL 103 06/02/2016   CHOLHDL 1.9 06/02/2016   VLDL 10 06/02/2016   LDLCALC 81 06/02/2016    Physical Findings: AIMS: Facial and Oral Movements Muscles of Facial Expression: None, normal Lips and Perioral Area: None, normal Jaw: None, normal Tongue: None, normal,Extremity Movements Upper (arms, wrists, hands, fingers): None, normal Lower (legs, knees, ankles, toes): None, normal, Trunk Movements Neck, shoulders, hips: None, normal, Overall Severity Severity of abnormal movements (highest score from questions above): None, normal Incapacitation due to abnormal movements: None, normal Patient's awareness of abnormal movements (rate only patient's report): No Awareness, Dental Status Current problems with teeth  and/or dentures?: No Does patient usually wear dentures?: No  CIWA:  CIWA-Ar Total: 3 COWS:  COWS Total Score: 7  Musculoskeletal: Strength & Muscle Tone: within normal limits Gait & Station: normal Patient leans: N/A  Psychiatric Specialty Exam: Physical Exam  Nursing note and vitals reviewed. Constitutional: He is oriented to person, place, and time. He appears well-developed and well-nourished.  HENT:  Head: Normocephalic.  Neck: Normal range of motion.  Musculoskeletal: Normal range of motion.  Neurological: He is alert and oriented to person, place, and time.  Psychiatric: He has a normal mood and affect. His behavior is normal.    Review of Systems  HENT:       C/o toothpain  Musculoskeletal: Positive for back pain.  Psychiatric/Behavioral: Positive for depression, hallucinations, substance abuse and suicidal ideas. The patient is nervous/anxious and has insomnia.   All other systems reviewed and are negative.   Blood pressure (!) 142/85, pulse 78, temperature 98.7 F (37.1 C), temperature source Oral, resp. rate 16, height 5\' 9"  (1.753 m), weight 80 kg (176 lb 5.9 oz).Body mass index is 26.05 kg/m.  General Appearance: Casual paper scrubs   Eye Contact:  Good  Speech:  Clear and Coherent rapid and pressured   Volume:  Increased  Mood:  Anxious and Euphoric  Affect:  Appropriate  Thought Process:  Coherent  Orientation:  Full (Time, Place, and Person)  Thought Content:  Hallucinations: Auditory  Suicidal Thoughts:  Yes.  with intent/plan Patient is able to contract for safe while on the unit  Homicidal Thoughts:  No  Memory:  Immediate;   Fair Recent;   Fair Remote;   Fair  Judgement:  Fair  Insight:  Lacking  Psychomotor Activity:  Restlessness  Concentration:  Concentration: Poor  Recall:  Fair  Fund of Knowledge:  Good  Language:  Good  Akathisia:  No  Handed:  Right  AIMS (if indicated):     Assets:  Communication Skills Desire for  Improvement Financial Resources/Insurance Resilience Social Support  AL's:  Intact  Cognition:  WNL  Sleep:  Number of Hours: 4.75     I agree with current treatment plan on 08/04/2016, Patient seen face-to-face for psychiatric evaluation follow-up, chart reviewed. Reviewed the information documented and agree with the treatment plan.  -Hold all antipsychotic due to prolonged QT/ repeat EKG 08/04/2016  Treatment Plan Summary: Daily contact with patient to assess and evaluate symptoms and progress in treatment and Medication management  -EKG orders placed for repeat on 9/4 -Start amoxicillin 500 mg PO BID X 3 day  -discontinue Seroquel 200 mg PO QHS due to prolonged QT478 -Contiune oral gel for tooth pain PRN.  One time order placed for Ultram 100 mgs (for tooth pain) - Contiune olanzapine 2.5 mg BID - continue on librium protocol - - Admit for crisis management and stabilization. - Medication management to reduce current symptoms to base line and improve the patient's overall level of functioning. - Monitor for the adverse effect of the medications and anger outbursts - Continue 15 minutes observation for safety concerns - Encouraged to participate in milieu therapy and group therapy counseling sessions and also work with coping skills -  Develop treatment plan to decrease risk of relapse upon discharge and to reduce the need for readmission. -  Psycho-social education regarding relapse prevention and self care. - Health care follow up as needed for medical problems. - Continue  home medications where appropriate.  Oneta Rack, NP 08/04/2016, 3:16 PM

## 2016-08-04 NOTE — Plan of Care (Signed)
Problem: Education: Goal: Utilization of techniques to improve thought processes will improve Outcome: Progressing Nurse discussed depression/anxiety/coping skills with patient.    

## 2016-08-05 MED ORDER — DIVALPROEX SODIUM ER 500 MG PO TB24
500.0000 mg | ORAL_TABLET | Freq: Two times a day (BID) | ORAL | Status: DC
  Administered 2016-08-05 – 2016-08-06 (×2): 500 mg via ORAL
  Filled 2016-08-05 (×6): qty 1

## 2016-08-05 MED ORDER — HYDROXYZINE HCL 25 MG PO TABS: 25.0000 mg | ORAL_TABLET | Freq: Four times a day (QID) | ORAL | Status: DC | PRN

## 2016-08-05 MED ORDER — GABAPENTIN 300 MG PO CAPS
300.0000 mg | ORAL_CAPSULE | Freq: Three times a day (TID) | ORAL | Status: DC
  Administered 2016-08-05 – 2016-08-06 (×3): 300 mg via ORAL
  Filled 2016-08-05 (×9): qty 1

## 2016-08-05 MED ORDER — DIVALPROEX SODIUM ER 500 MG PO TB24
500.0000 mg | ORAL_TABLET | Freq: Once | ORAL | Status: AC
  Administered 2016-08-05: 500 mg via ORAL
  Filled 2016-08-05: qty 1

## 2016-08-05 MED ORDER — LORAZEPAM 1 MG PO TABS: 1.0000 mg | ORAL_TABLET | Freq: Four times a day (QID) | ORAL | Status: DC | PRN

## 2016-08-05 MED ORDER — NICOTINE 21 MG/24HR TD PT24: 21.0000 mg | MEDICATED_PATCH | Freq: Every day | TRANSDERMAL | 0 refills | Status: DC

## 2016-08-05 MED ORDER — GABAPENTIN 100 MG PO CAPS: 200.0000 mg | ORAL_CAPSULE | Freq: Three times a day (TID) | ORAL | 0 refills | Status: DC

## 2016-08-05 MED ORDER — LISINOPRIL 40 MG PO TABS: 40.0000 mg | ORAL_TABLET | Freq: Every day | ORAL | 0 refills | Status: DC

## 2016-08-05 MED ORDER — TRAZODONE HCL 100 MG PO TABS: 100.0000 mg | ORAL_TABLET | Freq: Every day | ORAL | 0 refills | Status: DC

## 2016-08-05 MED ORDER — AMOXICILLIN 500 MG PO CAPS: 500.0000 mg | ORAL_CAPSULE | Freq: Two times a day (BID) | ORAL | 0 refills | Status: DC

## 2016-08-05 NOTE — Tx Team (Signed)
Interdisciplinary Treatment and Diagnostic Plan Update  08/05/2016 Time of Session: 9:34 AM  Lenward Able MRN: [id number]  Principal Diagnosis: Schizoaffective disorder, bipolar type ([place])  Secondary Diagnoses: Principal Problem:   Schizoaffective disorder, bipolar type ([place]) Active Problems:   Cocaine use disorder, severe, dependence ([place])   Alcohol use disorder, mild, abuse   Current Medications:  Current Facility-Administered Medications  Medication Dose Route Frequency Provider Last Rate Last Dose  . albuterol (PROVENTIL HFA;VENTOLIN HFA) 108 (90 Base) MCG/ACT inhaler 1-2 puff  1-2 puff Inhalation Q6H PRN Norman Clay, MD   2 puff at 07/31/16 1854  . alum & mag hydroxide-simeth (MAALOX/MYLANTA) 200-200-20 MG/5ML suspension 30 mL  30 mL Oral Q4H PRN Nanci Pina, FNP      . amoxicillin (AMOXIL) capsule 500 mg  500 mg Oral BID Derrill Center, NP   500 mg at 08/05/16 0823  . benzocaine (ORAJEL) 10 % mucosal gel   Mouth/Throat Daily PRN Derrill Center, NP      . clotrimazole (LOTRIMIN) 1 % cream   Topical BID Derrill Center, NP      . diphenhydrAMINE (BENADRYL) capsule 50 mg  50 mg Oral Q8H PRN Ursula Alert, MD   50 mg at 08/03/16 2334   Or  . diphenhydrAMINE (BENADRYL) injection 50 mg  50 mg Intramuscular Q6H PRN Ursula Alert, MD      . gabapentin (NEURONTIN) capsule 200 mg  200 mg Oral TID Laverle Hobby, PA-C   200 mg at 08/05/16 6283  . hydrocerin (EUCERIN) cream   Topical BID Derrill Center, NP      . ketorolac (TORADOL) tablet 10 mg  10 mg Oral Q8H PRN Derrill Center, NP   10 mg at 08/05/16 0035  . lisinopril (PRINIVIL,ZESTRIL) tablet 40 mg  40 mg Oral Daily Derrill Center, NP   40 mg at 08/05/16 1517  . magnesium hydroxide (MILK OF MAGNESIA) suspension 30 mL  30 mL Oral Daily PRN Nanci Pina, FNP      . multivitamin with minerals tablet 1 tablet  1 tablet Oral Daily Norman Clay, MD   1 tablet at 08/05/16 0820  . nicotine (NICODERM CQ - dosed in mg/24 hours) patch  21 mg  21 mg Transdermal Q0600 Laverle Hobby, PA-C      . nystatin (MYCOSTATIN) 100000 UNIT/ML suspension 500,000 Units  5 mL Oral QID Encarnacion Slates, NP   500,000 Units at 08/05/16 6160  . thiamine (B-1) injection 100 mg  100 mg Intramuscular Once Norman Clay, MD      . thiamine (VITAMIN B-1) tablet 100 mg  100 mg Oral Daily Norman Clay, MD   100 mg at 08/05/16 0820    PTA Medications: Prescriptions Prior to Admission  Medication Sig Dispense Refill Last Dose  . albuterol (PROVENTIL HFA;VENTOLIN HFA) 108 (90 Base) MCG/ACT inhaler Inhale 2 puffs into the lungs every 4 (four) hours as needed for wheezing or shortness of breath.   Past Month at Unknown time  . ARIPiprazole (ABILIFY) 10 MG tablet Take 1 tablet (10 mg total) by mouth at bedtime. For mood control 30 tablet 0 Past Month at Unknown time  . cyclobenzaprine (FLEXERIL) 10 MG tablet Take 1 tablet (10 mg total) by mouth 2 (two) times daily as needed for muscle spasms. 10 tablet 0 Past Month at Unknown time  . fluticasone (FLONASE) 50 MCG/ACT nasal spray Place 1 spray into both nostrils daily. For allergies  2 Past Month at Unknown  time  . hydrocerin (EUCERIN) CREA Apply 1 application topically 2 (two) times daily. For dry skin  0 Past Month at Unknown time  . lamoTRIgine (LAMICTAL) 25 MG tablet Take 2 tablets (50 mg total) by mouth every evening. For mood stabilization 60 tablet 0 Past Month at Unknown time  . lisinopril (PRINIVIL,ZESTRIL) 20 MG tablet Take 1 tablet (20 mg total) by mouth daily. For high blood pressure 30 tablet 0 Past Month at Unknown time  . magic mouthwash SOLN Take 5 mLs by mouth 4 (four) times daily. For oral thrush  0 Past Month at Unknown time  . traZODone (DESYREL) 100 MG tablet Take 1 tablet (100 mg total) by mouth at bedtime. For sleep 30 tablet 0 Past Month at Unknown time    Treatment Modalities: Medication Management, Group therapy, Case management,  1 to 1 session with clinician, Psychoeducation,  Recreational therapy.   Physician Treatment Plan for Primary Diagnosis: Schizoaffective disorder, bipolar type ([place]) Long Term Goal(s): Improvement in symptoms so as ready for discharge  Short Term Goals: Ability to demonstrate self-control will improve  Medication Management: Evaluate patient's response, side effects, and tolerance of medication regimen.  Therapeutic Interventions: 1 to 1 sessions, Unit Group sessions and Medication administration.  Evaluation of Outcomes: Not Met Pt refusing to engage with psychiatrist re: help   Physician Treatment Plan for Secondary Diagnosis: Principal Problem:   Schizoaffective disorder, bipolar type ([place]) Active Problems:   Cocaine use disorder, severe, dependence ([place])   Alcohol use disorder, mild, abuse   Long Term Goal(s): Improvement in symptoms so as ready for discharge  Short Term Goals: Ability to identify and develop effective coping behaviors will improve  Medication Management: Evaluate patient's response, side effects, and tolerance of medication regimen.  Therapeutic Interventions: 1 to 1 sessions, Unit Group sessions and Medication administration.  Evaluation of Outcomes: Not Met Pt today agreed to a trial of Zyprexa and Depakote with Dr Parke Poisson  RN Treatment Plan for Primary Diagnosis: Schizoaffective disorder, bipolar type ([place]) Long Term Goal(s): Knowledge of disease and therapeutic regimen to maintain health will improve  Short Term Goals: Ability to demonstrate self-control and Ability to identify and develop effective coping behaviors will improve  Medication Management: RN will administer medications as ordered by provider, will assess and evaluate patient's response and provide education to patient for prescribed medication. RN will report any adverse and/or side effects to prescribing provider.  Therapeutic Interventions: 1 on 1 counseling sessions, Psychoeducation, Medication administration, Evaluate responses to  treatment, Monitor vital signs and CBGs as ordered, Perform/monitor CIWA, COWS, AIMS and Fall Risk screenings as ordered, Perform wound care treatments as ordered.  Evaluation of Outcomes: Not Met  Pt refusing to engage with nurse on these issues   [name] Treatment Plan for Primary Diagnosis: Schizoaffective disorder, bipolar type ([place]) Long Term Goal(s): Safe transition to appropriate next level of care at discharge, Engage patient in therapeutic group addressing interpersonal concerns.  Short Term Goals: Engage patient in aftercare planning with referrals and resources, Facilitate patient progression through stages of change regarding substance use diagnoses and concerns and Identify triggers associated with mental health/substance abuse issues  Therapeutic Interventions: Assess for all discharge needs, 1 to 1 time with Social worker, Explore available resources and support systems, Assess for adequacy in community support network, Educate family and significant other(s) on suicide prevention, Complete Psychosocial Assessment, Interpersonal group therapy.  Evaluation of Outcomes: Not Met  Pt has refused to engage with CSW in any way   Progress  in Treatment: Attending groups: Yes Participating in groups: Yes Taking medication as prescribed: Yes, MD continues to assess for medication changes as needed Toleration medication: Yes, no side effects reported at this time Family/Significant other contact made: No Patient understands diagnosis: Yes Discussing patient identified problems/goals with staff: Yes Medical problems stabilized or resolved: Yes Denies suicidal/homicidal ideation: No, patient reported he was experiencing thoughts of suicide and homicide.  Issues/concerns per patient self-inventory: None Other: N/A  New problem(s) identified: None identified at this time.   New Short Term/Long Term Goal(s): None identified at this time.   Discharge Plan or Barriers: Pt refuses to  communicate with CSW  Reason for Continuation of Hospitalization: Irritability Anger  Estimated Length of Stay: 1-5 days  Attendees: Patient: 08/05/2016  9:34 AM  Physician: Dr. Shea Evans 08/05/2016  9:34 AM  Nursing: Phillis Haggis, RN  08/05/2016  9:34 AM  RN Care Manager: Lars Pinks 08/05/2016  9:34 AM  Social Worker: Ripley Fraise, [name] 08/05/2016  9:34 AM  Recreational Therapist: Winfield Cunas 08/05/2016  9:34 AM  Other: Radonna Ricker, Social Work Intern  08/05/2016  9:34 AM  Other:  08/05/2016  9:34 AM  Other: 08/05/2016  9:34 AM    Scribe for Treatment Team: Ripley Fraise 08/05/2016 9:34 AM

## 2016-08-05 NOTE — [facility] Group Notes (Signed)
[facility] [name] Group Therapy  08/05/2016 , 1:57 PM   Type of Therapy:  Group Therapy  Participation Level:  Active  Participation Quality:  Attentive  Affect:  Appropriate  Cognitive:  Alert  Insight:  Improving  Engagement in Therapy:  Engaged  Modes of Intervention:  Discussion, Exploration and Socialization  Summary of Progress/Problems: Today's group focused on the term Diagnosis.  Participants were asked to define the term, and then pronounce whether it is a negative, positive or neutral term. Invited.  Chose to not attend.  Daryel Gerald B 08/05/2016 , 1:57 PM

## 2016-08-05 NOTE — Progress Notes (Signed)
D: Pt mood remains labile. Denies SI, HI and AVH with Clinical research associate.  Pt presents with blunt affect and irritable mood,  animated and loud at other times. Argumentative and defensive, staff splitting as well. Unwilling to participate in care and treatment plan choosing who and whom and when to receive treatment. "these doctors don't know what the hell they're doing". Refused to have EKG done by Clinical research associate, demanding to have a male staff instead. Verbal outburst with Clinical research associate and MHT staff in his room. A: Scheduled and PRN medications administered as ordered. Support and availability provided to pt. Encouraged to voice concerns and attend scheduled groups. Safety maintained on Q15 minutes checks.  R:Cooperated with unit routines with increased encouragement at times. Attended scheduled groups. Remains safe on and off unit. Will continue to monitor pt for mood stabilization and safety.

## 2016-08-05 NOTE — Progress Notes (Signed)
D: Tracer's mood is still labile. He goes from conversant to irritable within a matter of a few minutes. He states his goal today " was to try and get out of here." When this writer questioned what happened to delay his discharge, Nijal ignored my question and became silent. When this writer questioned whether the discharge plan was to [employer], the patient became irritable and walked off stating loudly "ummm that conversation is over!".    A: Encouragement and support given. Q15 minute room checks for patient safety.  R: Continue to monitor for patient safety and medication effectiveness.

## 2016-08-05 NOTE — Progress Notes (Signed)
  [facility] Adult Case Management Discharge Plan :  Will you be returning to the same living situation after discharge:  Unknown  Pt refused to communicate  At discharge, do you have transportation home?: Unknown  Pt refused to communicate Do you have the ability to pay for your medications: Yes,  MCD  Release of information consent forms completed and in the chart;  Patient's signature needed at discharge.  Patient to Follow up at: Follow-up Information    Pt declined follow-up .           Next level of care provider has access to [facility] Link:no  Safety Planning and Suicide Prevention discussed: No. Pt refused  Have you used any form of tobacco in the last 30 days? (Cigarettes, Smokeless Tobacco, Cigars, and/or Pipes): Yes  Has patient been referred to the Quitline?: Patient refused referral  Patient has been referred for addiction treatment: Pt. refused referral  [name] 08/05/2016, 10:39 AM

## 2016-08-05 NOTE — [facility] Suicide Risk Assessment (Signed)
[facility] INPATIENT:  Family/Significant Other Suicide Prevention Education  Suicide Prevention Education:  Patient Refusal for Family/Significant Other Suicide Prevention Education: The patient [name] has refused to provide written consent for family/significant other to be provided Family/Significant Other Suicide Prevention Education during admission and/or prior to discharge.  Physician notified.  Ida Rogue 08/05/2016, 10:44 AM

## 2016-08-05 NOTE — Progress Notes (Signed)
Patient ID: [name], male   DOB: 10/14/1966, 50 y.o.   MRN: [id number] [facility] MD Progress Note  08/05/2016 11:31 AM [name]  MRN:  [id number] Subjective: Patient reports feeling angry, irritated , and states " I am still suicidal , I am not doing well "  Objective:  I have discussed case with staff , to include Dr. Shea Evans , [place], Nursing staff, and have met with patient . Asked to see patient because he is refusing to see Dr. Shea Evans  Patient is a 51 year old man, who has been diagnosed with Schizoaffective Disorder in the past, and who has history of Cocaine Dependence. He had a prior admission to our unit in May ,at which time he was discharged on Abilify, Lamictal . As discussed with staff , patient has been irritable , angry, uncooperative, and has " fired " CSW and Dr. Shea Evans . I discussed this with him, states " because they are full of ......, they sent me to [facility] last time I  Was here and when I got there nobody knew a thing ,there was no bed for me ". Patient reports hallucinations, but does not currently appear internally preoccupied , and reports ongoing suicidal ideations , but refuses to discuss. States " its none of your concern, just let me go and you'll see I wasn't lying ".  As session progressed he became somewhat more cooperative, but overall remained irritable, angry, and making frequent statements that he feels he is being treated poorly because " you guys don't think my Medicaid is worth  Anything ". Although irritable, he was not threatening or physically aggressive or assaultive . Patient reports Seroquel has helped, but he is now off it, because EKGs have shown tendency towards prolonged QTc  He states he has also been on Zyprexa in the past, which helped " at the beginning ".     Principal Problem: Schizoaffective disorder, bipolar type ([place]) Diagnosis:   Patient Active Problem List   Diagnosis Date Noted  . Alcohol use disorder, mild, abuse [F10.10]  07/31/2016  . Schizoaffective disorder, bipolar type ([place]) [F25.0] 05/31/2016  . Cocaine use disorder, severe, dependence ([place]) [F14.20] 05/31/2016  . Essential hypertension [I10] 05/31/2016   Total Time spent with patient: 30 minutes  Past Psychiatric History: See Above   Past Medical History:  Past Medical History:  Diagnosis Date  . Asthma   . Depression   . Homelessness   . Hypertension    History reviewed. No pertinent surgical history. Family History:  Family History  Problem Relation Age of Onset  . Drug abuse Mother   . Suicidality Mother    Family Psychiatric  History: See above Social History:  History  Alcohol Use  . Yes    Comment: infrequently     History  Drug Use  . Types: Cocaine    Social History   Social History  . Marital status: Single    Spouse name: N/A  . Number of children: N/A  . Years of education: N/A   Social History Main Topics  . Smoking status: Current Every Day Smoker    Types: Cigarettes  . Smokeless tobacco: Never Used  . Alcohol use Yes     Comment: infrequently  . Drug use:     Types: Cocaine  . Sexual activity: Not Asked   Other Topics Concern  . None   Social History Narrative  . None   Additional Social History:  Sleep: Fair  Appetite:  Fair  Current Medications: Current Facility-Administered Medications  Medication Dose Route Frequency Provider Last Rate Last Dose  . albuterol (PROVENTIL HFA;VENTOLIN HFA) 108 (90 Base) MCG/ACT inhaler 1-2 puff  1-2 puff Inhalation Q6H PRN Norman Clay, MD   2 puff at 07/31/16 1854  . alum & mag hydroxide-simeth (MAALOX/MYLANTA) 200-200-20 MG/5ML suspension 30 mL  30 mL Oral Q4H PRN Nanci Pina, FNP      . amoxicillin (AMOXIL) capsule 500 mg  500 mg Oral BID Derrill Center, NP   500 mg at 08/05/16 0823  . benzocaine (ORAJEL) 10 % mucosal gel   Mouth/Throat Daily PRN Derrill Center, NP      . clotrimazole (LOTRIMIN) 1 % cream   Topical  BID Derrill Center, NP   1 application at 11/23/81 0840  . diphenhydrAMINE (BENADRYL) capsule 50 mg  50 mg Oral Q8H PRN Ursula Alert, MD   50 mg at 08/03/16 2334   Or  . diphenhydrAMINE (BENADRYL) injection 50 mg  50 mg Intramuscular Q6H PRN Ursula Alert, MD      . divalproex (DEPAKOTE ER) 24 hr tablet 500 mg  500 mg Oral Once Myer Peer Cobos, MD      . gabapentin (NEURONTIN) capsule 200 mg  200 mg Oral TID Laverle Hobby, PA-C   200 mg at 08/05/16 5003  . hydrocerin (EUCERIN) cream   Topical BID Derrill Center, NP   1 application at 70/48/88 0840  . ketorolac (TORADOL) tablet 10 mg  10 mg Oral Q8H PRN Derrill Center, NP   10 mg at 08/05/16 0035  . lisinopril (PRINIVIL,ZESTRIL) tablet 40 mg  40 mg Oral Daily Derrill Center, NP   40 mg at 08/05/16 9169  . magnesium hydroxide (MILK OF MAGNESIA) suspension 30 mL  30 mL Oral Daily PRN Nanci Pina, FNP      . multivitamin with minerals tablet 1 tablet  1 tablet Oral Daily Norman Clay, MD   1 tablet at 08/05/16 0820  . nicotine (NICODERM CQ - dosed in mg/24 hours) patch 21 mg  21 mg Transdermal Q0600 Laverle Hobby, PA-C      . nystatin (MYCOSTATIN) 100000 UNIT/ML suspension 500,000 Units  5 mL Oral QID Encarnacion Slates, NP   500,000 Units at 08/05/16 4503  . thiamine (B-1) injection 100 mg  100 mg Intramuscular Once Norman Clay, MD      . thiamine (VITAMIN B-1) tablet 100 mg  100 mg Oral Daily Norman Clay, MD   100 mg at 08/05/16 0820    Lab Results:  No results found for this or any previous visit (from the past 28 hour(s)).  Blood Alcohol level:  Lab Results  Component Value Date   ETH <5 07/29/2016   ETH 7 (H) 88/82/8003    Metabolic Disorder Labs: Lab Results  Component Value Date   HGBA1C 5.7 (H) 06/02/2016   MPG 117 06/02/2016   Lab Results  Component Value Date   PROLACTIN 9.6 06/02/2016   Lab Results  Component Value Date   CHOL 194 06/02/2016   TRIG 52 06/02/2016   HDL 103 06/02/2016   CHOLHDL 1.9 06/02/2016    VLDL 10 06/02/2016   LDLCALC 81 06/02/2016    Physical Findings: AIMS: Facial and Oral Movements Muscles of Facial Expression: None, normal Lips and Perioral Area: None, normal Jaw: None, normal Tongue: None, normal,Extremity Movements Upper (arms, wrists, hands, fingers): None, normal Lower (legs, knees, ankles,  toes): None, normal, Trunk Movements Neck, shoulders, hips: None, normal, Overall Severity Severity of abnormal movements (highest score from questions above): None, normal Incapacitation due to abnormal movements: None, normal Patient's awareness of abnormal movements (rate only patient's report): No Awareness, Dental Status Current problems with teeth and/or dentures?: No Does patient usually wear dentures?: No  CIWA:  CIWA-Ar Total: 4 COWS:  COWS Total Score: 2  Musculoskeletal: Strength & Muscle Tone: within normal limits Gait & Station: normal Patient leans: N/A  Psychiatric Specialty Exam: Physical Exam  Nursing note and vitals reviewed. Constitutional: He is oriented to person, place, and time. He appears well-developed and well-nourished.  HENT:  Head: Normocephalic.  Neck: Normal range of motion.  Musculoskeletal: Normal range of motion.  Neurological: He is alert and oriented to person, place, and time.  Psychiatric: He has a normal mood and affect. His behavior is normal.    Review of Systems  HENT:       C/o toothpain  Musculoskeletal: Positive for back pain.  Psychiatric/Behavioral: Positive for depression, hallucinations, substance abuse and suicidal ideas. The patient is nervous/anxious and has insomnia.   All other systems reviewed and are negative.   Blood pressure (!) 144/83, pulse 73, temperature 98.7 F (37.1 C), temperature source Oral, resp. rate 16, height '5\' 9"'  (1.753 m), weight 176 lb 5.9 oz (80 kg).Body mass index is 26.05 kg/m.  General Appearance: fairly groomed   Eye Contact: fair   Speech:   Normal   Volume:  Increased   Mood:  Irritable, angry   Affect:  Irritable, labile   Thought Process:  Linear  Orientation:  Full (Time, Place, and Person)  Thought Content:  Reports auditory hallucinations, but does not appear internally preoccupied , no delusions expressed, ruminates about staff not caring about him due to his circumstances and his insurance   Suicidal Thoughts:  Makes implicit threats of suicide if discharged- states " I am still suicidal, if you don't believe me just let me go and then you'll see  I wasn't lying "    Homicidal Thoughts:  No  Memory:  Recent and remote grossly intact   Judgement:  Fair  Insight:  Lacking  Psychomotor Activity:  Vaguely restless  Concentration:  Concentration: Poor  Recall:  [place] of Knowledge:  Good  Language:  Good  Akathisia:  No  Handed:  Right  AIMS (if indicated):     Assets:  Communication Skills Desire for Improvement Financial Resources/Insurance Resilience Social Support  AL's:  Intact  Cognition:  WNL  Sleep:  Number of Hours: 4.75   Assessment - patient presents irritable, angry, intermittently loud, restless. He is not threatening at this time, and denies HI. Endorses SI if discharged, and ruminates about perception that he is being told he is being  discharged when he is still having  Suicidal ideations and hallucinations . He is not currently on any mood stabilizer or antipsychotic ( stopped due to prolonged QTc ) - has been refusing other medications  Treatment Plan Summary: Daily contact with patient to assess and evaluate symptoms and progress in treatment and Medication management  Repeat  EKG to monitor QTc . Depending on  EKG result would consider starting Zyprexa trial. Start Depakote ER 500 mgrs BID for mood disorder, irritability, anger- side effects discussed , patient agrees  Start Hydroxyzine 25 mgrs Q 6 hours PRN for anxiety Start Ativan 1 mgr Q 6 hours PRN for severe anxiety or agitation     COBOS,  Felicita Gage,  MD 08/05/2016, 11:31 AM

## 2016-08-05 NOTE — Progress Notes (Signed)
Recreation Therapy Notes  Date: 08/05/16 Time: 1000 Location: 500 Hall Dayroom  Group Topic: Anger Management  Goal Area(s) Addresses:  Patient will identify triggers for anger.  Patient will identify physical reaction to anger.   Patient will identify benefit of using coping skills when angry.  Intervention: Worksheet, colored pencils  Activity: Anger thermometer.  Patients were to rank the things that make them angry on a scale from 1 to 10, with 10 being the angriest you get.  Patients were to also give a description of that experience.  Patients were to then come up with coping skills to help them deal with their anger.    Education: Anger Management, Discharge Planning   Education Outcome: Needs additional education.   Clinical Observations/Feedback: Pt did not attend group.    Caroll Rancher, LRT/CTRS          Caroll Rancher A 08/05/2016 12:21 PM

## 2016-08-06 MED ORDER — HYDROXYZINE HCL 25 MG PO TABS: 25.0000 mg | ORAL_TABLET | Freq: Four times a day (QID) | ORAL | 0 refills | Status: DC | PRN

## 2016-08-06 MED ORDER — GABAPENTIN 300 MG PO CAPS: 300.0000 mg | ORAL_CAPSULE | Freq: Three times a day (TID) | ORAL | 0 refills | Status: DC

## 2016-08-06 MED ORDER — DIVALPROEX SODIUM ER 500 MG PO TB24: 500.0000 mg | ORAL_TABLET | Freq: Two times a day (BID) | ORAL | 0 refills | Status: DC

## 2016-08-06 NOTE — Progress Notes (Signed)
D: Pt A & O X 4.  D/C home as ordered. Denies SI, HI, AVH and pain at this time. Calm and cooperative with d/c assessment. Ambulatory with a steady gait.  A: Scheduled and PRN medications administered as prescribed. D/C instructions reviewed with pt including prescriptions. All belongings in locker 32 returned to pt at time of d/c. Support and encouragement provided. Q 15 minutes checks maintained for safety without outburst at present. R: Pt was selectively compliant with scheduled medications. Denies adverse drug reactions. Verbalized understanding related to d/c instructions. Signed belonging sheet in agreement with items received from locker. Denies concerns at this time. Remains safe in facility till time of d/c without physical distress to note at time of d/c.

## 2016-08-06 NOTE — [facility] Suicide Risk Assessment (Addendum)
[facility]   Principal Problem: Schizoaffective disorder, bipolar type [facility](HCC) Discharge Diagnoses:  Patient Active Problem List   Diagnosis Date Noted  . Alcohol use disorder, mild, abuse [F10.10] 07/31/2016  . Schizoaffective disorder, bipolar type (HCC) [F25.0] 05/31/2016  . Cocaine use disorder, severe, dependence (HCC) [F14.20] 05/31/2016  . Essential hypertension [I10] 05/31/2016    Total Time spent with patient: 30 minutes  Musculoskeletal: Strength & Muscle Tone: within normal limits Gait & Station: normal Patient leans: N/A  Psychiatric Specialty Exam: ROS denies headache, denies chest pain or shortness of breath,  denies vomiting   Blood pressure (!) 138/99, pulse 78, temperature 98.4 F (36.9 C), temperature source Oral, resp. rate 16, height 5\' 9"  (1.753 m), weight 176 lb 5.9 oz (80 kg).Body mass index is 26.05 kg/m.  General Appearance: improved grooming   Eye Contact::  Good  Speech:  Normal Rate409  Volume:  Normal  Mood:  improved mood today, states " I'm all right", denies depression   Affect:  still vaguely irritable , but much improved compared to yesterday, pleasant on approach, smiles at times appropriately   Thought Process:  Linear  Orientation:  Full (Time, Place, and Person)  Thought Content:  at this time denies hallucinations and does not appear internally preoccupied , no delusions expressed   Suicidal Thoughts:  No at this time denies suicidal or self injurious ideations   Homicidal Thoughts:  No currently denies homicidal or violent ideations   Memory:  recent and remote grossly intact   Judgement:  Other:  improving   Insight:  Fair  Psychomotor Activity:  Normal- no agitated or disruptive behaviors at this time   Concentration:  Good  Recall:  Good  Fund of Knowledge:Good  Language: Good  Akathisia:  Negative  Handed:  Right  AIMS (if indicated):     Assets:  Desire for Improvement Resilience  Sleep:  Number of  Hours: 4.75  Cognition: WNL  ADL's:  Improved    Mental Status Per Nursing Assessment::   On Admission:  Suicidal ideation indicated by patient, Suicide plan, Plan includes specific time, place, or method, Self-harm thoughts, Self-harm behaviors  Demographic Factors:  50 year old single male, homeless , on disability   Loss Factors: Limited support system, homelessness   Historical Factors: History of Schizoaffective Disorder, Bipolar type, and history of Cocaine/ Alcohol  Abuse   Risk Reduction Factors:   Positive coping skills or problem solving skills  Continued Clinical Symptoms:  Case discussed with Nursing Staff, patient has improved compared to yesterday, has been noted to be less irritable, less angry, calmer in general. Today patient presents significantly improved compared to yesterday- less irritable, less angry,  better related, calm, more cooperative on approach,  smiling at times appropriately ,and denying any suicidal ideations or any hallucinations. Denies any homicidal or violent ideations  Not presenting internally preoccupied, no delusions expressed . He is future oriented. Denies alcohol or cocaine cravings at this time  He states he is feeling better and ready for discharge today.  States he does feel that Depakote trial is helping and at this time does not endorse side effects    Cognitive Features That Contribute To Risk:  No gross cognitive deficits noted upon discharge. Is alert , attentive, and oriented x 3   Suicide Risk:  Mild:  Suicidal ideation of limited frequency, intensity, duration, and specificity.  There are no identifiable plans, no associated intent, mild dysphoria and related symptoms, good self-control (both  objective and subjective assessment), few other risk factors, and identifiable protective factors, including available and accessible social support.  Follow-up Information    Pt declined follow-up .           Plan Of Care/Follow-up  recommendations:  Activity:  as tolerated  Diet:  Regular  Tests:  NA Other:  see below  Patient presenting much improved- see above -, requesting discharge today  Of note, as discussed with treatment team has declined disposition referrals. He is connected with Case Manager via [place] and states he does plan to continue outpatient psychiatric treatment , he also states he plans to return to the hospital ED if any worsening .  Nehemiah Massed, MD 08/06/2016, 11:15 AM

## 2016-08-06 NOTE — [facility] Group Notes (Signed)
[facility] [name] Group Therapy  08/06/2016 2:32 PM   Type of Therapy:  Group Therapy   Participation Level:  Engaged  Participation Quality:  Attentive  Affect:  Appropriate   Cognitive:  Alert   Insight:  Engaged  Engagement in Therapy:  Improving   Modes of Intervention:  Education, Exploration, Socialization   Summary of Progress/Problems: [name] was invited to group session, chose not to attend.   [name] from [employer] was here to tell his story of recovery, inform patients about [facility] and play his guitar.   [name] 08/06/2016 2:32 PM

## 2016-08-06 NOTE — Discharge Summary (Signed)
Physician Discharge Summary Note  Patient:  [name] is an 50 y.o., male MRN:  [id number] DOB:  Jun 19, 1966 Patient phone:  [phone] (home)  Patient address:   [name] [place] [id number],  Total Time spent with patient: 30 minutes  Date of Admission:  07/30/2016 Date of Discharge: 08/06/2016  Reason for Admission:  suicidal and homicidal ideations  Principal Problem: Schizoaffective disorder, bipolar type [facility](HCC) Discharge Diagnoses: Patient Active Problem List   Diagnosis Date Noted  . Alcohol use disorder, mild, abuse [F10.10] 07/31/2016  . Schizoaffective disorder, bipolar type (HCC) [F25.0] 05/31/2016  . Cocaine use disorder, severe, dependence (HCC) [F14.20] 05/31/2016  . Essential hypertension [I10] 05/31/2016    Past Psychiatric History:  See HPI  Past Medical History:  Past Medical History:  Diagnosis Date  . Asthma   . Depression   . Homelessness   . Hypertension    History reviewed. No pertinent surgical history. Family History:  Family History  Problem Relation Age of Onset  . Drug abuse Mother   . Suicidality Mother    Family Psychiatric  History:  See HPI Social History:  History  Alcohol Use  . Yes    Comment: infrequently     History  Drug Use  . Types: Cocaine    Social History   Social History  . Marital status: Single    Spouse name: N/A  . Number of children: N/A  . Years of education: N/A   Social History Main Topics  . Smoking status: Current Every Day Smoker    Types: Cigarettes  . Smokeless tobacco: Never Used  . Alcohol use Yes     Comment: infrequently  . Drug use:     Types: Cocaine  . Sexual activity: Not Asked   Other Topics Concern  . None   Social History Narrative  . None    Hospital Course:   [name], a 50 yo male was [facility] for worsening depression, suicidal ideations via jumping off bridge.  He was also homicidal and stated "killing everyone."  His UDS was positive for  cocaine.  [name] was admitted for Schizoaffective disorder, bipolar type [facility](HCC) and crisis management.  He was treated with medications with their indications listed under Medication List below.  Medical problems were identified and treated as needed.  Home medications were restarted as appropriate.  Improvement was monitored by observation and [name] daily report of symptom reduction.  Emotional and mental status was monitored by daily self inventory reports completed by [name] and clinical staff.  Patient reported continued improvement, denied any new concerns.  Patient had been compliant on medications and denied side effects.  Support and encouragement was provided.    At time of discharge, patient rated both depression and anxiety levels to be manageable and minimal.  Patient encouraged to attend groups to help with recognizing triggers of emotional crises and de-stabilizations.  Patient encouraged to attend group to help identify the positive things in life that would help in dealing with feelings of loss, depression and unhealthy or abusive tendencies.         [name] FrohlichCorey [name] was evaluated by the treatment team for stability and plans for continued recovery upon discharge.  He was offered further treatment options upon discharge including Residential, Intensive Outpatient and Outpatient treatment.  He will follow up with age for medication management and counseling.  Encouraged patient to maintain satisfactory support network and home environment.  Advised to adhere to medication compliance  and outpatient treatment follow up.  Prescriptions provided.      [name] motivation was an integral factor for scheduling further treatment.  Employment, transportation, bed availability, health status, family support, and any pending legal issues were also considered during his hospital stay.  Upon completion of this admission the patient was both mentally and medically stable for  discharge denying suicidal/homicidal ideation, auditory/visual/tactile hallucinations, delusional thoughts and paranoia.      Physical Findings: AIMS: Facial and Oral Movements Muscles of Facial Expression: None, normal Lips and Perioral Area: None, normal Jaw: None, normal Tongue: None, normal,Extremity Movements Upper (arms, wrists, hands, fingers): None, normal Lower (legs, knees, ankles, toes): None, normal, Trunk Movements Neck, shoulders, hips: None, normal, Overall Severity Severity of abnormal movements (highest score from questions above): None, normal Incapacitation due to abnormal movements: None, normal Patient's awareness of abnormal movements (rate only patient's report): No Awareness, Dental Status Current problems with teeth and/or dentures?: No Does patient usually wear dentures?: No  CIWA:  CIWA-Ar Total: 0 COWS:  COWS Total Score: 2  Musculoskeletal: Strength & Muscle Tone: within normal limits Gait & Station: normal Patient leans: N/A  Psychiatric Specialty Exam:  See MD SRA Physical Exam  Nursing note and vitals reviewed. Psychiatric: He has a normal mood and affect. His behavior is normal. Judgment and thought content normal. His mood appears not anxious. His speech is not rapid and/or pressured. He is not agitated. Thought content is not paranoid. Cognition and memory are normal. He expresses no homicidal and no suicidal ideation.    Review of Systems  Constitutional: Negative.  Negative for fever.  HENT: Negative.   Eyes: Negative.  Negative for blurred vision.  Respiratory: Negative.   Cardiovascular: Negative.  Negative for chest pain.  Gastrointestinal: Negative.  Negative for heartburn.  Genitourinary: Negative.   Musculoskeletal: Negative.   Skin: Negative.   Neurological: Negative.  Negative for headaches.  Endo/Heme/Allergies: Negative.   Psychiatric/Behavioral: Negative.   All other systems reviewed and are negative.   Blood pressure (!)  138/99, pulse 78, temperature 98.4 F (36.9 C), temperature source Oral, resp. rate 16, height 5\' 9"  (1.753 m), weight 80 kg (176 lb 5.9 oz).Body mass index is 26.05 kg/m.    Have you used any form of tobacco in the last 30 days? (Cigarettes, Smokeless Tobacco, Cigars, and/or Pipes): Yes  Has this patient used any form of tobacco in the last 30 days? (Cigarettes, Smokeless Tobacco, Cigars, and/or Pipes) Yes, N/A  Blood Alcohol level:  Lab Results  Component Value Date   ETH <5 07/29/2016   ETH 7 (H) 05/29/2016    Metabolic Disorder Labs:  Lab Results  Component Value Date   HGBA1C 5.7 (H) 06/02/2016   MPG 117 06/02/2016   Lab Results  Component Value Date   PROLACTIN 9.6 06/02/2016   Lab Results  Component Value Date   CHOL 194 06/02/2016   TRIG 52 06/02/2016   HDL 103 06/02/2016   CHOLHDL 1.9 06/02/2016   VLDL 10 06/02/2016   LDLCALC 81 06/02/2016    See Psychiatric Specialty Exam and Suicide Risk Assessment completed by Attending Physician prior to discharge.  Discharge destination:  Home  Is patient on multiple antipsychotic therapies at discharge:  No   Has Patient had three or more failed trials of antipsychotic monotherapy by history:  No  Recommended Plan for Multiple Antipsychotic Therapies: NA     Medication List    STOP taking these medications   albuterol 108 (90 Base) MCG/ACT  inhaler Commonly known as:  PROVENTIL HFA;VENTOLIN HFA   ARIPiprazole 10 MG tablet Commonly known as:  ABILIFY   cyclobenzaprine 10 MG tablet Commonly known as:  FLEXERIL   fluticasone 50 MCG/ACT nasal spray Commonly known as:  FLONASE   hydrocerin Crea   lamoTRIgine 25 MG tablet Commonly known as:  LAMICTAL   magic mouthwash Soln     TAKE these medications     Indication  amoxicillin 500 MG capsule Commonly known as:  AMOXIL Take 1 capsule (500 mg total) by mouth 2 (two) times daily.  Indication:  tooth pain   gabapentin 100 MG capsule Commonly known as:   NEURONTIN Take 2 capsules (200 mg total) by mouth 3 (three) times daily.  Indication:  Agitation   lisinopril 40 MG tablet Commonly known as:  PRINIVIL,ZESTRIL Take 1 tablet (40 mg total) by mouth daily. What changed:  medication strength  how much to take  additional instructions  Indication:  High Blood Pressure Disorder   nicotine 21 mg/24hr patch Commonly known as:  NICODERM CQ - dosed in mg/24 hours Place 1 patch (21 mg total) onto the skin daily at 6 (six) AM.  Indication:  Nicotine Addiction   traZODone 100 MG tablet Commonly known as:  DESYREL Take 1 tablet (100 mg total) by mouth at bedtime. For sleep  Indication:  Trouble Sleeping      Follow-up Information    Pt declined follow-up .           Follow-up recommendations:  Activity:  as tol Diet:  as tol  Comments:  1.  Take all your medications as prescribed.   2.  Report any adverse side effects to outpatient provider. 3.  Patient instructed to not use alcohol or illegal drugs while on prescription medicines. 4.  In the event of worsening symptoms, instructed patient to call 911, the crisis hotline or go to nearest emergency room for evaluation of symptoms.  Signed: Lindwood Qua, NP [facility] 08/06/2016, 11:25 AM   Patient seen, Suicide Assessment Completed.  Disposition Plan Reviewed

## 2016-08-06 NOTE — Progress Notes (Signed)
Recreation Therapy Notes  Date: 08/06/16 Time: 1000 Location: 500 Hall Dayroom  Group Topic: Self-Esteem  Goal Area(s) Addresses:  Patient will identify positive ways to increase self-esteem. Patient will verbalize benefit of increased self-esteem.  Intervention: Worksheet, colored pencils  Activity: How I See Me.  LRT passed out worksheets of a mask with a blank face.  Patients were to fill in the mask with words and drawings of how they view themselves.   Education: Self-Esteem, Building control surveyor.   Education Outcome: Needs additional education  Clinical Observations/Feedback: Pt did not attend group.    Caroll Rancher, LRT/CTRS         Caroll Rancher A 08/06/2016 11:48 AM

## 2016-08-09 ENCOUNTER — Emergency Department (HOSPITAL_COMMUNITY)
Admission: EM | Admit: 2016-08-09 | Discharge: 2016-08-09 | Disposition: A | Discharge status: Home / Self Care | Payer: Medicaid Other | Attending: Emergency Medicine | Admitting: Emergency Medicine

## 2016-08-09 ENCOUNTER — Encounter (HOSPITAL_COMMUNITY): Payer: Self-pay | Admitting: *Deleted

## 2016-08-09 DIAGNOSIS — Z79899 Other long term (current) drug therapy: Secondary | ICD-10-CM | POA: Insufficient documentation

## 2016-08-09 DIAGNOSIS — I10 Essential (primary) hypertension: Secondary | ICD-10-CM | POA: Insufficient documentation

## 2016-08-09 DIAGNOSIS — Z5321 Procedure and treatment not carried out due to patient leaving prior to being seen by health care provider: Secondary | ICD-10-CM | POA: Diagnosis not present

## 2016-08-09 DIAGNOSIS — J45909 Unspecified asthma, uncomplicated: Secondary | ICD-10-CM | POA: Diagnosis not present

## 2016-08-09 DIAGNOSIS — F17210 Nicotine dependence, cigarettes, uncomplicated: Secondary | ICD-10-CM | POA: Diagnosis not present

## 2016-08-09 MED ORDER — IPRATROPIUM-ALBUTEROL 0.5-2.5 (3) MG/3ML IN SOLN
3.0000 mL | Freq: Once | RESPIRATORY_TRACT | Status: AC
  Administered 2016-08-09: 3 mL via RESPIRATORY_TRACT
  Filled 2016-08-09: qty 3

## 2016-08-09 MED ORDER — ALBUTEROL SULFATE (2.5 MG/3ML) 0.083% IN NEBU
5.0000 mg | INHALATION_SOLUTION | Freq: Once | RESPIRATORY_TRACT | Status: AC
  Administered 2016-08-09: 5 mg via RESPIRATORY_TRACT
  Filled 2016-08-09: qty 6

## 2016-08-09 NOTE — ED Notes (Signed)
The pt felt better after his hhn and left

## 2016-08-09 NOTE — ED Notes (Signed)
Pt walked up to the desk stating he was feeling better after the breathing treatment & wanted to leave. Pt was encouraged to stay, pt was then seen leaving the lobby.

## 2016-08-09 NOTE — ED Triage Notes (Signed)
The pt is c/o difficulty breathing tonight and his inhaler is not helping.  Hx asthma and he still smokes

## 2016-08-15 ENCOUNTER — Emergency Department (HOSPITAL_COMMUNITY)
Admission: EM | Admit: 2016-08-15 | Discharge: 2016-08-15 | Disposition: A | Discharge status: Home / Self Care | Payer: Medicaid Other | Attending: Emergency Medicine | Admitting: Emergency Medicine

## 2016-08-15 ENCOUNTER — Other Ambulatory Visit: Payer: Self-pay

## 2016-08-15 ENCOUNTER — Encounter (HOSPITAL_COMMUNITY): Payer: Self-pay | Admitting: *Deleted

## 2016-08-15 DIAGNOSIS — F17210 Nicotine dependence, cigarettes, uncomplicated: Secondary | ICD-10-CM | POA: Insufficient documentation

## 2016-08-15 DIAGNOSIS — J45909 Unspecified asthma, uncomplicated: Secondary | ICD-10-CM | POA: Diagnosis not present

## 2016-08-15 DIAGNOSIS — R42 Dizziness and giddiness: Secondary | ICD-10-CM | POA: Diagnosis present

## 2016-08-15 DIAGNOSIS — Z79899 Other long term (current) drug therapy: Secondary | ICD-10-CM | POA: Insufficient documentation

## 2016-08-15 DIAGNOSIS — I10 Essential (primary) hypertension: Secondary | ICD-10-CM | POA: Insufficient documentation

## 2016-08-15 DIAGNOSIS — Z9114 Patient's other noncompliance with medication regimen: Secondary | ICD-10-CM | POA: Diagnosis not present

## 2016-08-15 LAB — I-STAT TROPONIN, ED: Troponin i, poc: 0.01 ng/mL (ref 0.00–0.08)

## 2016-08-15 LAB — I-STAT CHEM 8, ED
BUN: 20 mg/dL (ref 6–20)
CREATININE: 1.1 mg/dL (ref 0.61–1.24)
Calcium, Ion: 1.19 mmol/L (ref 1.15–1.40)
Chloride: 104 mmol/L (ref 101–111)
GLUCOSE: 69 mg/dL (ref 65–99)
HEMATOCRIT: 44 % (ref 39.0–52.0)
Hemoglobin: 15 g/dL (ref 13.0–17.0)
Potassium: 3.8 mmol/L (ref 3.5–5.1)
Sodium: 140 mmol/L (ref 135–145)
TCO2: 25 mmol/L (ref 0–100)

## 2016-08-15 MED ORDER — LISINOPRIL 20 MG PO TABS
40.0000 mg | ORAL_TABLET | Freq: Once | ORAL | Status: AC
  Administered 2016-08-15: 40 mg via ORAL
  Filled 2016-08-15: qty 2

## 2016-08-15 MED ORDER — CLONIDINE HCL 0.1 MG PO TABS
0.1000 mg | ORAL_TABLET | Freq: Once | ORAL | Status: AC
  Administered 2016-08-15: 0.1 mg via ORAL
  Filled 2016-08-15: qty 1

## 2016-08-15 MED ORDER — LISINOPRIL 40 MG PO TABS: 40.0000 mg | ORAL_TABLET | Freq: Every day | ORAL | 0 refills | Status: DC

## 2016-08-15 NOTE — ED Notes (Signed)
Pt groggy and does not want to be assessed by RN anymore.  Only answers questions w/ "I don't know."

## 2016-08-15 NOTE — ED Provider Notes (Signed)
Care assumed from [facility], [place], at shift change. Pt here with lightheadedness for the last several days, not taking his lisinopril and found to be hypertensive here. Results so far show Trop neg, Chem 8 WNL. Awaiting UDS and for pt to get his clonidine and have the lisinopril he was given time to work. Plan is to recheck after meds are given to see if his BP comes down.   Physical Exam  BP (!) 171/105   Pulse 71   Temp 98.2 F (36.8 C) (Oral)   Resp 16   SpO2 95%    Physical Exam Gen: afebrile, NAD, BP 165/101 on recheck, trending down but still elevated HEENT: EOMI, MMM Resp: CTAB in all lung fields, no w/r/r, no hypoxia or increased WOB, speaking in full sentences, SpO2 95% on RA  CV: RRR, nl s1/s2, no m/r/g, distal pulses intact, no pedal edema  Abd: appearance normal, nondistended MsK: moving all extremities with ease Neuro: A&O x4  ED Course  Procedures Results for orders placed or performed during the hospital encounter of 08/15/16  I-stat chem 8, ed  Result Value Ref Range   Sodium 140 135 - 145 mmol/L   Potassium 3.8 3.5 - 5.1 mmol/L   Chloride 104 101 - 111 mmol/L   BUN 20 6 - 20 mg/dL   Creatinine, Ser 5.63 0.61 - 1.24 mg/dL   Glucose, Bld 69 65 - 99 mg/dL   Calcium, Ion 8.75 6.43 - 1.40 mmol/L   TCO2 25 0 - 100 mmol/L   Hemoglobin 15.0 13.0 - 17.0 g/dL   HCT 32.9 51.8 - 84.1 %  I-stat troponin, ED  Result Value Ref Range   Troponin i, poc 0.01 0.00 - 0.08 ng/mL   Comment 3           Ct Head Wo Contrast  Result Date: 07/30/2016 CLINICAL DATA:  Headache EXAM: CT HEAD WITHOUT CONTRAST TECHNIQUE: Contiguous axial images were obtained from the base of the skull through the vertex without intravenous contrast. COMPARISON:  None. FINDINGS: Brain: There is no mass lesion, intraparenchymal hemorrhage or extra-axial collection. No evidence of acute cortical infarct. No hydrocephalus. Brain parenchyma and CSF-containing spaces are normal for age. There is no expansion or  abnormal hyperdensity of the visible dural venous sinuses. No colloid cyst is identified. Normal position of the cerebellar tonsils. Vascular: No hyperdense vessel or unexpected calcification. Skull: Normal Sinuses/Orbits: Moderate mucosal thickening of the left ethmoid air cells and inferior left frontal sinus with occlusion of the left frontal recess. No fluid levels. Normal orbits. Other: Visualized extracranial soft tissues are normal. IMPRESSION: 1. Normal appearance of the brain. No acute intracranial abnormality. 2. Left frontoethmoidal sinus mucosal thickening. Electronically Signed   By: Deatra Robinson M.D.   On: 07/30/2016 01:02     Meds ordered this encounter  Medications  . lisinopril (PRINIVIL,ZESTRIL) tablet 40 mg  . cloNIDine (CATAPRES) tablet 0.1 mg  . lisinopril (PRINIVIL,ZESTRIL) 40 MG tablet    Sig: Take 1 tablet (40 mg total) by mouth daily.    Dispense:  30 tablet    Refill:  0    Order Specific Question:   Supervising Provider    Answer:   Eber Hong [[id number]]     MDM:   ICD-9-CM ICD-10-CM   1. Hypertension not at goal 401.9 I10   2. Lightheadedness 780.4 R42   3. Noncompliance with medication regimen V15.81 Z91.14    6:34 AM- pt just received clonidine, BP trending down but still 160s/100s;  awaiting UDS. Refused EKG being done. Pt requests a bed rest form for his homeless shelter when he leaves. Discussed that we will reassess after meds have had time to work, and UDS is done, then likely d/c back to shelter at that time. Will reassess shortly.   7:58 AM- pt's BP down to 151/96, reasonable improvement, feel as though he could go back to homeless shelter at this point. Discussed adherence to his BP regimen, low-salt diet, hydration with water, and f/up with [facility] in 1wk to establish care and recheck symptoms. Refill of BP meds given. UDS not yet done, but I don't feel we absolutely need this for our evaluation today. Will d/c now. I explained the diagnosis and have given  explicit precautions to return to the ER including for any other new or worsening symptoms. The patient understands and accepts the medical plan as it's been dictated and I have answered their questions. Discharge instructions concerning home care and prescriptions have been given. The patient is STABLE and is discharged to home in good condition.  BP 151/96   Pulse (!) 57   Temp 98.2 F (36.8 C) (Oral)   Resp 20   SpO2 100%     Lexxus Underhill Camprubi-Soms, PA-C 08/15/16 0803    Dione Boozeavid Glick, MD 08/15/16 [phone]

## 2016-08-15 NOTE — Discharge Instructions (Signed)
Your symptoms were likely due to your blood pressure being elevated. Take your lisinopril as directed. Eat a low-salt diet to help improve your blood pressure and overall health. Stay well hydrated with plenty of water. Follow up with  and wellness in 1 week to establish care and recheck after today's visit. Return to the ER for changes or worsening symptoms.

## 2016-08-15 NOTE — ED Triage Notes (Addendum)
States, here just to get an Rx for lisinopril, last had at [facility] ~2 weeks ago. Mentions some light headedness/ dizziness. Denies other sx. Sx onset 2d ago. States, just wants Rx.

## 2016-08-15 NOTE — ED Notes (Signed)
Pt refused EKG.

## 2016-08-15 NOTE — ED Provider Notes (Signed)
[facility]-EMERGENCY DEPT Provider Note   CSN: [id number] Arrival date & time: 08/15/16  0127    History   Chief Complaint Chief Complaint  Patient presents with  . Dizziness    [name] is a 50 y.o. male.  50 year old male with a history of depression, hypertension, and homelessness presents to the emergency department for evaluation of lightheadedness and dizziness. He states that symptoms have been intermittent for the past 3 days. Symptoms are sometimes aggravated with ambulation and position change. He reports running out of his blood pressure medication approximately 2 weeks ago. He states that he is supposed to be taking 40 mg lisinopril daily. He is not currently followed by a primary care doctor. He denies headache, syncope, chest pain, shortness of breath, nausea, and vomiting.   The history is provided by the patient. No language interpreter was used.  Dizziness    Past Medical History:  Diagnosis Date  . Asthma   . Depression   . Homelessness   . Hypertension     Patient Active Problem List   Diagnosis Date Noted  . Alcohol use disorder, mild, abuse 07/31/2016  . Schizoaffective disorder, bipolar type (HCC) 05/31/2016  . Cocaine use disorder, severe, dependence (HCC) 05/31/2016  . Essential hypertension 05/31/2016    History reviewed. No pertinent surgical history.     Home Medications    Prior to Admission medications   Medication Sig Start Date End Date Taking? Authorizing Provider  amoxicillin (AMOXIL) 500 MG capsule Take 1 capsule (500 mg total) by mouth 2 (two) times daily. 08/05/16   Adonis BrookSheila [name], NP  divalproex (DEPAKOTE ER) 500 MG 24 hr tablet Take 1 tablet (500 mg total) by mouth 2 (two) times daily. 08/06/16   Adonis BrookSheila [name], NP  gabapentin (NEURONTIN) 300 MG capsule Take 1 capsule (300 mg total) by mouth 3 (three) times daily. 08/06/16   Adonis BrookSheila [name], NP  hydrOXYzine (ATARAX/VISTARIL) 25 MG tablet Take 1 tablet (25 mg total) by mouth every  6 (six) hours as needed for anxiety. 08/06/16   Adonis BrookSheila [name], NP  lisinopril (PRINIVIL,ZESTRIL) 40 MG tablet Take 1 tablet (40 mg total) by mouth daily. 08/05/16   Adonis BrookSheila [name], NP  nicotine (NICODERM CQ - DOSED IN MG/24 HOURS) 21 mg/24hr patch Place 1 patch (21 mg total) onto the skin daily at 6 (six) AM. 08/05/16   Adonis BrookSheila [name], NP  traZODone (DESYREL) 100 MG tablet Take 1 tablet (100 mg total) by mouth at bedtime. For sleep 08/05/16   Adonis BrookSheila [name], NP    Family History Family History  Problem Relation Age of Onset  . Drug abuse Mother   . Suicidality Mother     Social History Social History  Substance Use Topics  . Smoking status: Current Every Day Smoker    Types: Cigarettes  . Smokeless tobacco: Never Used  . Alcohol use Yes     Comment: infrequently     Allergies   Tylenol [acetaminophen]   Review of Systems Review of Systems  Neurological: Positive for dizziness.  Ten systems reviewed and are negative for acute change, except as noted in the HPI.    Physical Exam Updated Vital Signs BP 150/100 (BP Location: Left Arm)   Pulse 96   Temp 98.2 F (36.8 C) (Oral)   Resp 16   SpO2 98%   Physical Exam  Constitutional: He is oriented to person, place, and time. He appears well-developed and well-nourished. No distress.  Nontoxic appearing, in NAD  HENT:  Head: Normocephalic and  atraumatic.  Eyes: Conjunctivae and EOM are normal. No scleral icterus.  Neck: Normal range of motion.  Cardiovascular: Normal rate, regular rhythm and intact distal pulses.   Pulmonary/Chest: Effort normal. No respiratory distress.  Respirations even and unlabored. Lungs CTAB.  Musculoskeletal: Normal range of motion.  Neurological: He is alert and oriented to person, place, and time.  Ambulatory with steady gait.  Skin: Skin is warm and dry. No rash noted. He is not diaphoretic. No erythema. No pallor.  Psychiatric: He has a normal mood and affect. His behavior is normal.  Nursing  note and vitals reviewed.    ED Treatments / Results  Labs (all labs ordered are listed, but only abnormal results are displayed) Labs Reviewed - No data to display  EKG  EKG Interpretation None       Radiology No results found.  Procedures Procedures (including critical care time)  Medications Ordered in ED Medications  lisinopril (PRINIVIL,ZESTRIL) tablet 40 mg (40 mg Oral Given 08/15/16 0435)     Initial Impression / Assessment and Plan / ED Course  I have reviewed the triage vital signs and the nursing notes.  Pertinent labs & imaging results that were available during my care of the patient were reviewed by me and considered in my medical decision making (see chart for details).  Clinical Course    Patient presenting for lightheadedness and dizziness in the setting of medication noncompliance. Patient has been out of his lisinopril for approximately 2 weeks. Suspects that last dose was at discharge from behavioral health on 08/06/2016. Patient given was central in the emergency department; however, he continues to have elevation of his blood pressure. Patient were to receive 1 dose of clonidine. He does have a history of cocaine abuse. Urine drug screen ordered for this reason. Will also check heart enzyme and EKG given suspected symptomatic hypertension. Patient signed out to oncoming provider at shift change who will follow and disposition appropriately.   Final Clinical Impressions(s) / ED Diagnoses   Final diagnoses:  Hypertension not at goal    New Prescriptions New Prescriptions   No medications on file     Antony Madura, PA-C 08/15/16 [id number]    Dione Booze, MD 08/15/16 [phone]

## 2016-08-15 NOTE — ED Notes (Addendum)
Results given to Long MD

## 2016-08-18 DIAGNOSIS — Z8659 Personal history of other mental and behavioral disorders: Secondary | ICD-10-CM

## 2016-08-24 ENCOUNTER — Encounter (HOSPITAL_COMMUNITY): Payer: Self-pay | Admitting: *Deleted

## 2016-08-24 ENCOUNTER — Emergency Department (HOSPITAL_COMMUNITY)
Admission: EM | Admit: 2016-08-24 | Discharge: 2016-08-24 | Disposition: A | Discharge status: Home / Self Care | Payer: Medicaid Other | Attending: Emergency Medicine | Admitting: Emergency Medicine

## 2016-08-24 DIAGNOSIS — R05 Cough: Secondary | ICD-10-CM | POA: Diagnosis not present

## 2016-08-24 DIAGNOSIS — J45909 Unspecified asthma, uncomplicated: Secondary | ICD-10-CM | POA: Diagnosis not present

## 2016-08-24 DIAGNOSIS — I10 Essential (primary) hypertension: Secondary | ICD-10-CM | POA: Insufficient documentation

## 2016-08-24 DIAGNOSIS — R0602 Shortness of breath: Secondary | ICD-10-CM | POA: Diagnosis present

## 2016-08-24 DIAGNOSIS — Z79899 Other long term (current) drug therapy: Secondary | ICD-10-CM | POA: Diagnosis not present

## 2016-08-24 DIAGNOSIS — F17210 Nicotine dependence, cigarettes, uncomplicated: Secondary | ICD-10-CM | POA: Diagnosis not present

## 2016-08-24 DIAGNOSIS — R059 Cough, unspecified: Secondary | ICD-10-CM

## 2016-08-24 MED ORDER — PREDNISONE 20 MG PO TABS: 40.0000 mg | ORAL_TABLET | Freq: Every day | ORAL | 0 refills | Status: DC

## 2016-08-24 MED ORDER — ALBUTEROL SULFATE HFA 108 (90 BASE) MCG/ACT IN AERS
2.0000 | INHALATION_SPRAY | RESPIRATORY_TRACT | Status: DC | PRN
  Administered 2016-08-24: 2 via RESPIRATORY_TRACT
  Filled 2016-08-24: qty 6.7

## 2016-08-24 MED ORDER — IPRATROPIUM BROMIDE 0.02 % IN SOLN
0.5000 mg | Freq: Once | RESPIRATORY_TRACT | Status: AC
  Administered 2016-08-24: 0.5 mg via RESPIRATORY_TRACT
  Filled 2016-08-24: qty 2.5

## 2016-08-24 MED ORDER — ALBUTEROL SULFATE (2.5 MG/3ML) 0.083% IN NEBU
5.0000 mg | INHALATION_SOLUTION | Freq: Once | RESPIRATORY_TRACT | Status: AC
  Administered 2016-08-24: 5 mg via RESPIRATORY_TRACT
  Filled 2016-08-24: qty 6

## 2016-08-24 MED ORDER — PREDNISONE 20 MG PO TABS
60.0000 mg | ORAL_TABLET | Freq: Once | ORAL | Status: AC
  Administered 2016-08-24: 60 mg via ORAL
  Filled 2016-08-24: qty 3

## 2016-08-24 NOTE — ED Provider Notes (Signed)
[facility]PT Provider Note   CSN: [id number] Arrival date & time: 08/24/16  0429     History   Chief Complaint Chief Complaint  Patient presents with  . Asthma    [name] is a 50 y.o. male.  Patient with history of hypertension, asthma -- presents with complaint of shortness of breath starting last night. Patient states that he has a tight sensation in his chest along with wheezing. He used his albuterol inhaler several times without improvement. He also reports having nasal congestion and runny nose. No fevers, sore throat. Patient has a nonproductive cough. No vomiting, diarrhea, or abdominal pain. Patient states that his symptoms are similar to previous flares of his asthma. The onset of this condition was acute. The course is constant. Aggravating factors: none. Alleviating factors: none.        Past Medical History:  Diagnosis Date  . Asthma   . Depression   . Homelessness   . Hypertension     Patient Active Problem List   Diagnosis Date Noted  . Alcohol use disorder, mild, abuse 07/31/2016  . Schizoaffective disorder, bipolar type (HCC) 05/31/2016  . Cocaine use disorder, severe, dependence (HCC) 05/31/2016  . Essential hypertension 05/31/2016    History reviewed. No pertinent surgical history.     Home Medications    Prior to Admission medications   Medication Sig Start Date End Date Taking? Authorizing Provider  divalproex (DEPAKOTE ER) 500 MG 24 hr tablet Take 1 tablet (500 mg total) by mouth 2 (two) times daily. 08/06/16  Yes Adonis BrookSheila [name], NP  gabapentin (NEURONTIN) 300 MG capsule Take 1 capsule (300 mg total) by mouth 3 (three) times daily. 08/06/16  Yes Adonis BrookSheila [name], NP  hydrOXYzine (ATARAX/VISTARIL) 25 MG tablet Take 1 tablet (25 mg total) by mouth every 6 (six) hours as needed for anxiety. 08/06/16  Yes Adonis BrookSheila [name], NP  lisinopril (PRINIVIL,ZESTRIL) 40 MG tablet Take 1 tablet (40 mg total) by mouth daily. 08/15/16  Yes Mercedes  Camprubi-Soms, PA-C  traZODone (DESYREL) 100 MG tablet Take 1 tablet (100 mg total) by mouth at bedtime. For sleep 08/05/16  Yes Adonis BrookSheila [name], NP  amoxicillin (AMOXIL) 500 MG capsule Take 1 capsule (500 mg total) by mouth 2 (two) times daily. Patient not taking: Reported on 08/24/2016 08/05/16   Adonis BrookSheila [name], NP  lisinopril (PRINIVIL,ZESTRIL) 40 MG tablet Take 1 tablet (40 mg total) by mouth daily. Patient not taking: Reported on 08/24/2016 08/05/16   Adonis BrookSheila [name], NP  nicotine (NICODERM CQ - DOSED IN MG/24 HOURS) 21 mg/24hr patch Place 1 patch (21 mg total) onto the skin daily at 6 (six) AM. Patient not taking: Reported on 08/24/2016 08/05/16   Adonis BrookSheila [name], NP    Family History Family History  Problem Relation Age of Onset  . Drug abuse Mother   . Suicidality Mother     Social History Social History  Substance Use Topics  . Smoking status: Current Every Day Smoker    Types: Cigarettes  . Smokeless tobacco: Never Used  . Alcohol use Yes     Comment: infrequently     Allergies   Tylenol [acetaminophen]   Review of Systems Review of Systems  Constitutional: Negative for fever.  HENT: Positive for congestion and rhinorrhea. Negative for sore throat.   Eyes: Negative for redness.  Respiratory: Positive for cough, chest tightness, shortness of breath and wheezing.   Cardiovascular: Negative for chest pain and leg swelling.  Gastrointestinal: Negative for abdominal pain, diarrhea, nausea and vomiting.  Genitourinary: Negative for dysuria.  Musculoskeletal: Negative for myalgias.  Skin: Negative for rash.  Neurological: Negative for headaches.     Physical Exam Updated Vital Signs BP (!) 155/108 (BP Location: Left Arm)   Pulse 89   Temp 98.1 F (36.7 C) (Oral)   Resp 18   Ht 5\' 9"  (1.753 m)   Wt 81.6 kg   SpO2 98%   BMI 26.58 kg/m   Physical Exam  Constitutional: He appears well-developed and well-nourished.  HENT:  Head: Normocephalic and atraumatic.  Eyes:  Conjunctivae are normal. Right eye exhibits no discharge. Left eye exhibits no discharge.  Neck: Normal range of motion. Neck supple.  Cardiovascular: Normal rate, regular rhythm and normal heart sounds.   Pulmonary/Chest: Effort normal. He has wheezes (Slight end expiratory).  Abdominal: Soft. He exhibits no mass. There is no tenderness. There is no guarding.  Neurological: He is alert.  Skin: Skin is warm and dry.  Psychiatric: He has a normal mood and affect.  Nursing note and vitals reviewed.    ED Treatments / Results   Procedures Procedures (including critical care time)  Medications Ordered in ED Medications  albuterol (PROVENTIL) (2.5 MG/3ML) 0.083% nebulizer solution 5 mg (5 mg Nebulization Given 08/24/16 0621)  ipratropium (ATROVENT) nebulizer solution 0.5 mg (0.5 mg Nebulization Given 08/24/16 0621)  predniSONE (DELTASONE) tablet 60 mg (60 mg Oral Given 08/24/16 7096)    Initial Impression / Assessment and Plan / ED Course  I have reviewed the triage vital signs and the nursing notes.  Pertinent labs & imaging results that were available during my care of the patient were reviewed by me and considered in my medical decision making (see chart for details).  Clinical Course   Patient seen and examined. Medications ordered. Anticipate discharge home if doing better after breathing treatment.  Vital signs reviewed and are as follows: BP (!) 155/108 (BP Location: Left Arm)   Pulse 89   Temp 98.1 F (36.7 C) (Oral)   Resp 18   Ht 5\' 9"  (1.753 m)   Wt 81.6 kg   SpO2 98%   BMI 26.58 kg/m   ED ECG REPORT   Date: 08/24/2016  Rate: 88  Rhythm: normal sinus rhythm  QRS Axis: left  Intervals: normal  ST/T Wave abnormalities: nonspecific T wave changes  Conduction Disutrbances:none  Narrative Interpretation: LVH, atrial enlargement  Old EKG Reviewed: unchanged  I have personally reviewed the EKG tracing and agree with the computerized printout as noted.  6:53 AM  patient improved after breathing treatment. Will discharge to home with prednisone, albuterol inhaler, work note.  Encourage patient to return to the emergency department with worsening symptoms, increased work of breathing, trouble breathing, chest pain, or concerns.  Final Clinical Impressions(s) / ED Diagnoses   Final diagnoses:  Cough  Essential hypertension   Patient with cough and wheezing, likely related to asthma exacerbation in setting of upper respiratory tract infection. Treated in emergency department with improvement. No chest pain. EKG today is unchanged from previous. No signs and symptoms concerning for PE or ACS. Patient has baseline hypertension. No obvious signs of end organ damage. Patient to continue antihypertensives.  New Prescriptions New Prescriptions   PREDNISONE (DELTASONE) 20 MG TABLET    Take 2 tablets (40 mg total) by mouth daily.     Renne Crigler, PA-C 08/24/16 2836    Shon Baton, MD 08/24/16 2300

## 2016-08-24 NOTE — ED Triage Notes (Signed)
The pt is c/o sob  Although he is not in any resp distress he is asleep in the triage room  He admits to drinking alcohol tonight he reports that he has had more  Diff breathing the past 2 days

## 2016-08-24 NOTE — Discharge Instructions (Signed)
Please read and follow all provided instructions.  Your diagnoses today include:  1. Cough     Tests performed today include:  EKG - unchanged from prior EKG  Vital signs. See below for your results today.   Medications prescribed:   Albuterol inhaler - medication that opens up your airway  Use inhaler as follows: 1-2 puffs with spacer every 4 hours as needed for wheezing, cough, or shortness of breath.    Prednisone - steroid medicine   It is best to take this medication in the morning to prevent sleeping problems. If you are diabetic, monitor your blood sugar closely and stop taking Prednisone if blood sugar is over 300. Take with food to prevent stomach upset.   Take any prescribed medications only as directed.  Home care instructions:  Follow any educational materials contained in this packet.  Follow-up instructions: Please follow-up with your primary care provider in the next 3 days for further evaluation of your symptoms and management of your asthma.  Return instructions:   Please return to the Emergency Department if you experience worsening symptoms.  Please return with worsening wheezing, shortness of breath, or difficulty breathing.  Return with persistent fever above 101F.   Please return if you have any other emergent concerns.  Additional Information:  Your vital signs today were: BP 188/56    Pulse 67    Temp 98.1 F (36.7 C) (Oral)    Resp 18    Ht 5\' 9"  (1.753 m)    Wt 81.6 kg    SpO2 100%    BMI 26.58 kg/m  If your blood pressure (BP) was elevated above 135/85 this visit, please have this repeated by your doctor within one month. --------------

## 2016-09-01 ENCOUNTER — Encounter (HOSPITAL_COMMUNITY): Payer: Self-pay

## 2016-09-01 DIAGNOSIS — F1410 Cocaine abuse, uncomplicated: Secondary | ICD-10-CM | POA: Insufficient documentation

## 2016-09-01 DIAGNOSIS — Z79899 Other long term (current) drug therapy: Secondary | ICD-10-CM | POA: Diagnosis not present

## 2016-09-01 DIAGNOSIS — R45851 Suicidal ideations: Secondary | ICD-10-CM | POA: Insufficient documentation

## 2016-09-01 DIAGNOSIS — F1099 Alcohol use, unspecified with unspecified alcohol-induced disorder: Secondary | ICD-10-CM | POA: Diagnosis not present

## 2016-09-01 DIAGNOSIS — F17210 Nicotine dependence, cigarettes, uncomplicated: Secondary | ICD-10-CM | POA: Insufficient documentation

## 2016-09-01 DIAGNOSIS — I10 Essential (primary) hypertension: Secondary | ICD-10-CM | POA: Diagnosis not present

## 2016-09-01 DIAGNOSIS — J45909 Unspecified asthma, uncomplicated: Secondary | ICD-10-CM | POA: Diagnosis not present

## 2016-09-01 DIAGNOSIS — F1420 Cocaine dependence, uncomplicated: Secondary | ICD-10-CM | POA: Diagnosis not present

## 2016-09-01 DIAGNOSIS — F250 Schizoaffective disorder, bipolar type: Secondary | ICD-10-CM | POA: Diagnosis not present

## 2016-09-01 DIAGNOSIS — R51 Headache: Secondary | ICD-10-CM | POA: Diagnosis present

## 2016-09-01 LAB — COMPREHENSIVE METABOLIC PANEL
ALK PHOS: 69 U/L (ref 38–126)
ALT: 42 U/L (ref 17–63)
AST: 51 U/L — ABNORMAL HIGH (ref 15–41)
Albumin: 4.4 g/dL (ref 3.5–5.0)
Anion gap: 12 (ref 5–15)
BILIRUBIN TOTAL: 1.1 mg/dL (ref 0.3–1.2)
BUN: 20 mg/dL (ref 6–20)
CALCIUM: 9.5 mg/dL (ref 8.9–10.3)
CO2: 24 mmol/L (ref 22–32)
CREATININE: 1.34 mg/dL — AB (ref 0.61–1.24)
Chloride: 103 mmol/L (ref 101–111)
GFR calc Af Amer: >60 mL/min (ref >60)
GLUCOSE: 146 mg/dL — AB (ref 65–99)
Potassium: 4.1 mmol/L (ref 3.5–5.1)
Sodium: 139 mmol/L (ref 135–145)
TOTAL PROTEIN: 7.4 g/dL (ref 6.5–8.1)

## 2016-09-01 LAB — SALICYLATE LEVEL: Salicylate Lvl: <4 mg/dL (ref 2.8–30.0)

## 2016-09-01 LAB — CBC
HEMATOCRIT: 43.8 % (ref 39.0–52.0)
HEMOGLOBIN: 14.6 g/dL (ref 13.0–17.0)
MCH: 33.2 pg (ref 26.0–34.0)
MCHC: 33.3 g/dL (ref 30.0–36.0)
MCV: 99.5 fL (ref 78.0–100.0)
Platelets: 239 10*3/uL (ref 150–400)
RBC: 4.4 MIL/uL (ref 4.22–5.81)
RDW: 14.1 % (ref 11.5–15.5)
WBC: 9.7 10*3/uL (ref 4.0–10.5)

## 2016-09-01 LAB — ACETAMINOPHEN LEVEL: Acetaminophen (Tylenol), Serum: <10 ug/mL — ABNORMAL LOW (ref 10–30)

## 2016-09-01 LAB — RAPID URINE DRUG SCREEN, HOSP PERFORMED
Amphetamines: NOT DETECTED
BARBITURATES: NOT DETECTED
Benzodiazepines: NOT DETECTED
COCAINE: POSITIVE — AB
Opiates: NOT DETECTED
TETRAHYDROCANNABINOL: NOT DETECTED

## 2016-09-01 LAB — ETHANOL: Alcohol, Ethyl (B): <5 mg/dL (ref <5)

## 2016-09-01 NOTE — ED Notes (Signed)
Spoke with Minerva Areola in [place] about a Comptroller.  They are aware of needing a sitter.

## 2016-09-01 NOTE — ED Triage Notes (Signed)
Pt. Stated, " i Don't want to live anymore."   Pt. Stated, "The voices tell me to hurt myself."  Pt. Reports that he has tried to hurt himself in the past.  Pt. Has a headache.  Skin is warm and dry.  Pt. Is alert and oriented X4. Pt. Changed into scrubs and wanded in triage.

## 2016-09-02 ENCOUNTER — Emergency Department (HOSPITAL_COMMUNITY)
Admission: EM | Admit: 2016-09-02 | Discharge: 2016-09-04 | Disposition: A | Discharge status: Other Acute Inpatient Hospital | Payer: Medicaid Other | Attending: Physician Assistant | Admitting: Physician Assistant

## 2016-09-02 DIAGNOSIS — F1910 Other psychoactive substance abuse, uncomplicated: Secondary | ICD-10-CM

## 2016-09-02 DIAGNOSIS — F250 Schizoaffective disorder, bipolar type: Secondary | ICD-10-CM | POA: Diagnosis present

## 2016-09-02 DIAGNOSIS — F1420 Cocaine dependence, uncomplicated: Secondary | ICD-10-CM | POA: Diagnosis present

## 2016-09-02 MED ORDER — GABAPENTIN 300 MG PO CAPS
300.0000 mg | ORAL_CAPSULE | Freq: Three times a day (TID) | ORAL | Status: DC
  Administered 2016-09-02 – 2016-09-04 (×3): 300 mg via ORAL
  Filled 2016-09-02 (×5): qty 1

## 2016-09-02 MED ORDER — DIVALPROEX SODIUM ER 500 MG PO TB24
500.0000 mg | ORAL_TABLET | Freq: Two times a day (BID) | ORAL | Status: DC
  Filled 2016-09-02 (×4): qty 1

## 2016-09-02 MED ORDER — ZOLPIDEM TARTRATE 5 MG PO TABS: 5.0000 mg | ORAL_TABLET | Freq: Every evening | ORAL | Status: DC | PRN

## 2016-09-02 MED ORDER — ONDANSETRON HCL 4 MG PO TABS: 4.0000 mg | ORAL_TABLET | Freq: Three times a day (TID) | ORAL | Status: DC | PRN

## 2016-09-02 MED ORDER — IBUPROFEN 400 MG PO TABS: 600.0000 mg | ORAL_TABLET | Freq: Three times a day (TID) | ORAL | Status: DC | PRN

## 2016-09-02 MED ORDER — CHLORHEXIDINE GLUCONATE 0.12 % MT SOLN
15.0000 mL | Freq: Two times a day (BID) | OROMUCOSAL | Status: DC
  Administered 2016-09-04: 15 mL via OROMUCOSAL
  Filled 2016-09-02 (×5): qty 15

## 2016-09-02 MED ORDER — TRAZODONE HCL 100 MG PO TABS
100.0000 mg | ORAL_TABLET | Freq: Every day | ORAL | Status: DC
  Filled 2016-09-02: qty 1

## 2016-09-02 MED ORDER — HYDROXYZINE HCL 25 MG PO TABS: 25.0000 mg | ORAL_TABLET | Freq: Four times a day (QID) | ORAL | Status: DC | PRN

## 2016-09-02 MED ORDER — LISINOPRIL 20 MG PO TABS
40.0000 mg | ORAL_TABLET | Freq: Every day | ORAL | Status: DC
  Administered 2016-09-02 – 2016-09-04 (×3): 40 mg via ORAL
  Filled 2016-09-02 (×3): qty 2

## 2016-09-02 NOTE — ED Notes (Signed)
Patient was given a snack and drink, and a regular diet ordered for lunch. 

## 2016-09-02 NOTE — ED Notes (Addendum)
Patient demanding sandwich and drink. Explained rules of eating at night. Patient getting verbally escalated. Meal given.

## 2016-09-02 NOTE — ED Notes (Signed)
Patient refused to let me get V/S.

## 2016-09-02 NOTE — ED Notes (Signed)
Snack given. Dinner order taken

## 2016-09-02 NOTE — Progress Notes (Signed)
Referred pt for inpatient treatment at recommendation of TTS. Referred to: [facility]- per [facility]- per [facility] [place]- left voicemail for intake.  Left voicemail with Turner Daniels and will refer if there is bed availability.  Ilean Skill, MSW, [name] Clinical Social Work, Disposition  09/02/2016 [phone]

## 2016-09-02 NOTE — ED Notes (Signed)
Offered shower to pt.  Pt reports he wishes to shower later in the day

## 2016-09-02 NOTE — ED Notes (Signed)
2nd breakfast tray was ordered.

## 2016-09-02 NOTE — ED Notes (Signed)
Pt to be admitted-seeking placement

## 2016-09-02 NOTE — ED Notes (Signed)
Pt belongings: bookbag, various books and papers, pants, shirt, shoes and socks inventoried and placed in utility room.  Box cutter found in bookbag locked up with security.

## 2016-09-02 NOTE — ED Notes (Signed)
Pt refuses chlorohexidine at this time. He states "he wants to rinse his mouth after dinner"

## 2016-09-02 NOTE — ED Notes (Signed)
Belongings at desk. Sitter at bedside.

## 2016-09-02 NOTE — ED Provider Notes (Signed)
[facility] Provider Note   CSN: [id number] Arrival date & time: 09/01/16  1747     History   Chief Complaint Chief Complaint  Patient presents with  . Medical Clearance    [name] is a 50 y.o. male.  This 50 year old man with a long-standing psychiatric history who presents emergency department stating that he does want to live.  He has antagonistic and uncooperative answering every question with a question, then stating she go look in the chart.  He did state that he does not have a plan in the wrist doesn't have a plan for his suicidal thoughts      Past Medical History:  Diagnosis Date  . Asthma   . Depression   . Homelessness   . Hypertension     Patient Active Problem List   Diagnosis Date Noted  . Alcohol use disorder, mild, abuse 07/31/2016  . Schizoaffective disorder, bipolar type (HCC) 05/31/2016  . Cocaine use disorder, severe, dependence (HCC) 05/31/2016  . Essential hypertension 05/31/2016    History reviewed. No pertinent surgical history.     Home Medications    Prior to Admission medications   Medication Sig Start Date End Date Taking? Authorizing Provider  divalproex (DEPAKOTE ER) 500 MG 24 hr tablet Take 1 tablet (500 mg total) by mouth 2 (two) times daily. 08/06/16   Adonis Brook, NP  gabapentin (NEURONTIN) 300 MG capsule Take 1 capsule (300 mg total) by mouth 3 (three) times daily. 08/06/16   Adonis Brook, NP  hydrOXYzine (ATARAX/VISTARIL) 25 MG tablet Take 1 tablet (25 mg total) by mouth every 6 (six) hours as needed for anxiety. 08/06/16   Adonis Brook, NP  lisinopril (PRINIVIL,ZESTRIL) 40 MG tablet Take 1 tablet (40 mg total) by mouth daily. 08/15/16   Mercedes Camprubi-Soms, PA-C  predniSONE (DELTASONE) 20 MG tablet Take 2 tablets (40 mg total) by mouth daily. 08/24/16   Renne Crigler, PA-C  traZODone (DESYREL) 100 MG tablet Take 1 tablet (100 mg total) by mouth at bedtime. For sleep 08/05/16   Adonis Brook, NP     Family History Family History  Problem Relation Age of Onset  . Drug abuse Mother   . Suicidality Mother     Social History Social History  Substance Use Topics  . Smoking status: Current Every Day Smoker    Types: Cigarettes  . Smokeless tobacco: Never Used  . Alcohol use Yes     Comment: infrequently     Allergies   Tylenol [acetaminophen]   Review of Systems Review of Systems  Constitutional: Negative for fever.  Neurological: Positive for headaches.  Psychiatric/Behavioral: Positive for suicidal ideas.  All other systems reviewed and are negative.    Physical Exam Updated Vital Signs BP 164/98 (BP Location: Right Arm)   Pulse 78   Temp 98.2 F (36.8 C) (Oral)   Resp 16   Ht 5\' 9"  (1.753 m)   Wt 81.6 kg   SpO2 100%   BMI 26.58 kg/m   Physical Exam  Constitutional: He is oriented to person, place, and time. He appears well-developed and well-nourished.  HENT:  Head: Normocephalic.  Eyes: Pupils are equal, round, and reactive to light.  Neck: Normal range of motion.  Cardiovascular: Normal rate.   Pulmonary/Chest: Effort normal.  Musculoskeletal: Normal range of motion.  Neurological: He is alert and oriented to person, place, and time.  Psychiatric: His speech is normal. His affect is inappropriate. He is aggressive. Cognition and memory are normal. He expresses inappropriate  judgment. He expresses suicidal ideation. He expresses no suicidal plans.  Nursing note and vitals reviewed.    ED Treatments / Results  Labs (all labs ordered are listed, but only abnormal results are displayed) Labs Reviewed  COMPREHENSIVE METABOLIC PANEL - Abnormal; Notable for the following:       Result Value   Glucose, Bld 146 (*)    Creatinine, Ser 1.34 (*)    AST 51 (*)    All other components within normal limits  ACETAMINOPHEN LEVEL - Abnormal; Notable for the following:    Acetaminophen (Tylenol), Serum <10 (*)    All other components within normal limits   URINE RAPID DRUG SCREEN, HOSP PERFORMED - Abnormal; Notable for the following:    Cocaine POSITIVE (*)    All other components within normal limits  ETHANOL  SALICYLATE LEVEL  CBC    EKG  EKG Interpretation None       Radiology No results found.  Procedures Procedures (including critical care time)  Medications Ordered in ED Medications - No data to display   Initial Impression / Assessment and Plan / ED Course  I have reviewed the triage vital signs and the nursing notes.  Pertinent labs & imaging results that were available during my care of the patient were reviewed by me and considered in my medical decision making (see chart for details).  Clinical Course    Patient is medically cleared for psychiatric assessment   Final Clinical Impressions(s) / ED Diagnoses   Final diagnoses:  None    New Prescriptions New Prescriptions   No medications on file     Earley FavorGail Shomari Matusik, NP 09/02/16 0042    Earley FavorGail Nayvie Lips, NP 09/02/16 0043    Courteney Randall AnLyn Mackuen, MD 09/02/16 [id number]

## 2016-09-02 NOTE — ED Notes (Signed)
Patient refused to take medication until Psychiatry sees him.

## 2016-09-02 NOTE — ED Notes (Addendum)
Pt is requesting to speak with MD.  When asked if there are any concerns RN could help with pt sts "It's confidential."  Pt is very harsh with staff and becoming agitated when questioned.  Little MD made aware.

## 2016-09-02 NOTE — ED Notes (Addendum)
Pt refused to take temp, but did allow sitter to get all other vitals. Victorino Dike - RN aware.

## 2016-09-02 NOTE — ED Notes (Signed)
Ordered breakfast tray at [employer]
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
## 2016-09-02 NOTE — ED Notes (Signed)
Assessment team at bedside. 

## 2016-09-03 DIAGNOSIS — F1420 Cocaine dependence, uncomplicated: Secondary | ICD-10-CM | POA: Diagnosis not present

## 2016-09-03 DIAGNOSIS — Z813 Family history of other psychoactive substance abuse and dependence: Secondary | ICD-10-CM

## 2016-09-03 DIAGNOSIS — R45851 Suicidal ideations: Secondary | ICD-10-CM

## 2016-09-03 DIAGNOSIS — F17210 Nicotine dependence, cigarettes, uncomplicated: Secondary | ICD-10-CM | POA: Diagnosis not present

## 2016-09-03 DIAGNOSIS — F1099 Alcohol use, unspecified with unspecified alcohol-induced disorder: Secondary | ICD-10-CM | POA: Diagnosis not present

## 2016-09-03 DIAGNOSIS — F250 Schizoaffective disorder, bipolar type: Secondary | ICD-10-CM | POA: Diagnosis not present

## 2016-09-03 NOTE — ED Notes (Signed)
Patient calling out stating that he needs to speak to the house supervisor, when this RN inquired what the patient needs to speak to them about he stated "just a situation that occurred."

## 2016-09-03 NOTE — ED Notes (Signed)
Pt refused 1800 vitals 

## 2016-09-03 NOTE — ED Notes (Signed)
Pt refusing vitals to be taken besides blood pressure at this time

## 2016-09-03 NOTE — ED Notes (Addendum)
Pt woke up demanding lunch.  RN attempted to explain that it was 1:30am.   After RN re-oriented pt to time, he reported that he did not get dinner, especially since nobody would tell him what he ate.  RN reminded that he got a sandwich at snack time at 10pm.  He demanded to speak to the psychiatrist, RN explained that there was not one on campus.  He then demanded to speak to the nursing supervisor.  Charge RN notified, pt informed that she was taking care of other matters and would be by shortly.

## 2016-09-03 NOTE — Progress Notes (Signed)
Followed up on inpatient referral efforts.  Declined: [facility]- per Rubbie Battiest- states reason for decline was not documented when MD reviewed last night  Referred to: [facility]- per [facility]- per August Saucer, cannot reach assessment team at this time to check status of referral [facility]- left voicemail for intake.  Other facilities contacted at capacity.  Ilean Skill, MSW, [name] Clinical Social Work, Disposition  09/03/2016 [phone]

## 2016-09-03 NOTE — ED Notes (Signed)
tts machine placed at bedside  

## 2016-09-03 NOTE — ED Notes (Signed)
Pt refused snack. Asked for [place] sandwich instead.

## 2016-09-03 NOTE — ED Notes (Signed)
Patient was given a snack and drink, and a regular diet ordered for lunch. 

## 2016-09-03 NOTE — ED Notes (Signed)
Conrad from [facility] contacted this RN stating that he feels patient needs placement at this time, advised he will keep this RN updated on finding a bed for patient.

## 2016-09-03 NOTE — ED Notes (Signed)
Food tray at bedside.

## 2016-09-03 NOTE — ED Notes (Signed)
Pt refused 9 PM snack.

## 2016-09-03 NOTE — ED Notes (Signed)
Breakfast tray at bedside 

## 2016-09-03 NOTE — ED Notes (Signed)
Charge RN Asher Muir in room to speak to patient per his request

## 2016-09-03 NOTE — ED Notes (Signed)
Pt refused all night time medications.

## 2016-09-03 NOTE — Consult Note (Signed)
Telepsych Consultation   Reason for Consult:  Suicidal ideation   Referring Physician:  EDP   Patient Identification: [name] MRN:  [id number] Principal Diagnosis: Cocaine use disorder, severe, dependence ([place]) with suicidal ideation, schizoaffective bipolar depressed Diagnosis:   Patient Active Problem List   Diagnosis Date Noted  . Schizoaffective disorder, bipolar type ([place]) [F25.0] 05/31/2016    Priority: High  . Cocaine use disorder, severe, dependence ([place]) [F14.20] 05/31/2016    Priority: High  . Alcohol use disorder, mild, abuse [F10.10] 07/31/2016  . Essential hypertension [I10] 05/31/2016    Total Time spent with patient: 30 minutes  Subjective:   [name] is a 50 y.o. male patient admitted with reports of suicidal ideation and cocaine intoxication. Pt seen and chart reviewed. Pt is alert/oriented x4, calm, cooperative, and appropriate to situation. Pt denies homicidal ideation and does not appear to be responding to internal stimuli. However, pt has a history of schizoaffective and reports that he is experiencing hallucinations with commands to kill himself and others and fears that he will act on these commands. He states that he wants to cut his neck or overdose and that he cannot contract for safety.   HPI: I have reviewed and concur with HPI elements modified as below: Patient is a 50 year old man that reports SI. Patient reports that, "I don't want to live anymore." Patient reports hearing voices commanding him to hurt himself and to slap other people. Patient reports a past history of suicide attempts.  Patient reports a history of inpatient psychiatric. [facility]  Patient reports that he is not able to contract for safety.   Past Psychiatric History: MDD, schizoaffective  Risk to Self: Suicidal Ideation: Yes-Currently Present Suicidal Intent: Yes-Currently Present Is patient at risk for suicide?:  Yes Suicidal Plan?: Yes-Currently Present Specify Current Suicidal Plan: Voices telling him how to kill himself Access to Means: No Specify Access to Suicidal Means: None Reported What has been your use of drugs/alcohol within the last 12 months?: Cocaine How many times?: 4 Other Self Harm Risks: NA Triggers for Past Attempts: Unpredictable Intentional Self Injurious Behavior: None Risk to Others: Homicidal Ideation: No Thoughts of Harm to Others: Yes-Currently Present Comment - Thoughts of Harm to Others: Voices telling him to slap others Current Homicidal Intent: No Current Homicidal Plan: No Describe Current Homicidal Plan: NA Access to Homicidal Means: No Describe Access to Homicidal Means: NA Identified Victim: NA History of harm to others?: No Assessment of Violence: None Noted Violent Behavior Description: NA Does patient have access to weapons?: No Criminal Charges Pending?: No Does patient have a court date: No Prior Inpatient Therapy: Prior Inpatient Therapy: Yes Prior Therapy Dates: pt was unsure Prior Therapy Facilty/Provider(s): unknown Reason for Treatment: SA/ SI Prior Outpatient Therapy: Prior Outpatient Therapy: No Prior Therapy Dates:  (NA) Prior Therapy Facilty/Provider(s): NA Reason for Treatment: NA Does patient have an ACCT team?: No Does patient have Intensive In-House Services?  : No Does patient have [facility] services? : No Does patient have P4CC services?: No  Past Medical History:  Past Medical History:  Diagnosis Date  . Asthma   . Depression   . Homelessness   . Hypertension    History reviewed. No pertinent surgical history. Family History:  Family History  Problem Relation Age of Onset  . Drug abuse Mother   . Suicidality Mother    Family Psychiatric  History: MDD Social History:  History  Alcohol Use  . Yes    Comment: infrequently     History  Drug Use  . Types: Cocaine    Social History   Social History  . Marital  status: Single    Spouse name: N/A  . Number of children: N/A  . Years of education: N/A   Social History Main Topics  . Smoking status: Current Every Day Smoker    Types: Cigarettes  . Smokeless tobacco: Never Used  . Alcohol use Yes     Comment: infrequently  . Drug use:     Types: Cocaine  . Sexual activity: Not Asked   Other Topics Concern  . None   Social History Narrative  . None   Additional Social History:    Allergies:   Allergies  Allergen Reactions  . Tylenol [Acetaminophen] Nausea And Vomiting    Labs:  Results for orders placed or performed during the hospital encounter of 09/02/16 (from the past 48 hour(s))  Comprehensive metabolic panel     Status: Abnormal   Collection Time: 09/01/16  7:14 PM  Result Value Ref Range   Sodium 139 135 - 145 mmol/L   Potassium 4.1 3.5 - 5.1 mmol/L   Chloride 103 101 - 111 mmol/L   CO2 24 22 - 32 mmol/L   Glucose, Bld 146 (H) 65 - 99 mg/dL   BUN 20 6 - 20 mg/dL   Creatinine, Ser 1.34 (H) 0.61 - 1.24 mg/dL   Calcium 9.5 8.9 - 10.3 mg/dL   Total Protein 7.4 6.5 - 8.1 g/dL   Albumin 4.4 3.5 - 5.0 g/dL   AST 51 (H) 15 - 41 U/L   ALT 42 17 - 63 U/L   Alkaline Phosphatase 69 38 - 126 U/L   Total Bilirubin 1.1 0.3 - 1.2 mg/dL   GFR calc non Af Amer >60 >60 mL/min   GFR calc Af Amer >60 >60 mL/min    Comment: (NOTE) The eGFR has been calculated using the CKD EPI equation. This calculation has not been validated in all clinical situations. eGFR's persistently <60 mL/min signify possible Chronic Kidney Disease.    Anion gap 12 5 - 15  cbc     Status: None   Collection Time: 09/01/16  7:14 PM  Result Value Ref Range   WBC 9.7 4.0 - 10.5 K/uL   RBC 4.40 4.22 - 5.81 MIL/uL   Hemoglobin 14.6 13.0 - 17.0 g/dL   HCT 43.8 39.0 - 52.0 %   MCV 99.5 78.0 - 100.0 fL   MCH 33.2 26.0 - 34.0 pg   MCHC 33.3 30.0 - 36.0 g/dL   RDW 14.1 11.5 - 15.5 %   Platelets 239 150 - 400 K/uL  Rapid urine drug screen (hospital performed)      Status: Abnormal   Collection Time: 09/01/16  7:53 PM  Result Value Ref Range   Opiates NONE DETECTED NONE DETECTED   Cocaine POSITIVE (A) NONE DETECTED   Benzodiazepines NONE DETECTED NONE DETECTED   Amphetamines NONE DETECTED NONE DETECTED   Tetrahydrocannabinol NONE DETECTED NONE DETECTED   Barbiturates NONE DETECTED NONE DETECTED    Comment:        DRUG SCREEN FOR MEDICAL PURPOSES ONLY.  IF CONFIRMATION IS NEEDED FOR ANY PURPOSE, NOTIFY LAB WITHIN 5 DAYS.        LOWEST DETECTABLE LIMITS FOR URINE DRUG SCREEN Drug Class       Cutoff (ng/mL) Amphetamine      1000 Barbiturate  200 Benzodiazepine   267 Tricyclics       124 Opiates          300 Cocaine          300 THC              50   Ethanol     Status: None   Collection Time: 09/01/16  7:55 PM  Result Value Ref Range   Alcohol, Ethyl (B) <5 <5 mg/dL    Comment:        LOWEST DETECTABLE LIMIT FOR SERUM ALCOHOL IS 5 mg/dL FOR MEDICAL PURPOSES ONLY   Salicylate level     Status: None   Collection Time: 09/01/16  7:55 PM  Result Value Ref Range   Salicylate Lvl <5.8 2.8 - 30.0 mg/dL  Acetaminophen level     Status: Abnormal   Collection Time: 09/01/16  7:55 PM  Result Value Ref Range   Acetaminophen (Tylenol), Serum <10 (L) 10 - 30 ug/mL    Comment:        THERAPEUTIC CONCENTRATIONS VARY SIGNIFICANTLY. A RANGE OF 10-30 ug/mL MAY BE AN EFFECTIVE CONCENTRATION FOR MANY PATIENTS. HOWEVER, SOME ARE BEST TREATED AT CONCENTRATIONS OUTSIDE THIS RANGE. ACETAMINOPHEN CONCENTRATIONS >150 ug/mL AT 4 HOURS AFTER INGESTION AND >50 ug/mL AT 12 HOURS AFTER INGESTION ARE OFTEN ASSOCIATED WITH TOXIC REACTIONS.     Current Facility-Administered Medications  Medication Dose Route Frequency Provider Last Rate Last Dose  . chlorhexidine (PERIDEX) 0.12 % solution 15 mL  15 mL Mouth/Throat BID Sharlett Iles, MD   Stopped at 09/02/16 1727  . divalproex (DEPAKOTE ER) 24 hr tablet 500 mg  500 mg Oral BID Junius Creamer,  NP      . gabapentin (NEURONTIN) capsule 300 mg  300 mg Oral TID Junius Creamer, NP   300 mg at 09/03/16 1541  . hydrOXYzine (ATARAX/VISTARIL) tablet 25 mg  25 mg Oral Q6H PRN Junius Creamer, NP      . ibuprofen (ADVIL,MOTRIN) tablet 600 mg  600 mg Oral Q8H PRN Junius Creamer, NP      . lisinopril (PRINIVIL,ZESTRIL) tablet 40 mg  40 mg Oral Daily Junius Creamer, NP   40 mg at 09/03/16 1003  . ondansetron (ZOFRAN) tablet 4 mg  4 mg Oral Q8H PRN Junius Creamer, NP      . traZODone (DESYREL) tablet 100 mg  100 mg Oral QHS Junius Creamer, NP      . zolpidem (AMBIEN) tablet 5 mg  5 mg Oral QHS PRN Junius Creamer, NP       Current Outpatient Prescriptions  Medication Sig Dispense Refill  . divalproex (DEPAKOTE ER) 500 MG 24 hr tablet Take 1 tablet (500 mg total) by mouth 2 (two) times daily. 60 tablet 0  . gabapentin (NEURONTIN) 300 MG capsule Take 1 capsule (300 mg total) by mouth 3 (three) times daily. 90 capsule 0  . hydrOXYzine (ATARAX/VISTARIL) 25 MG tablet Take 1 tablet (25 mg total) by mouth every 6 (six) hours as needed for anxiety. 30 tablet 0  . lisinopril (PRINIVIL,ZESTRIL) 40 MG tablet Take 1 tablet (40 mg total) by mouth daily. 30 tablet 0  . predniSONE (DELTASONE) 20 MG tablet Take 2 tablets (40 mg total) by mouth daily. 8 tablet 0  . traZODone (DESYREL) 100 MG tablet Take 1 tablet (100 mg total) by mouth at bedtime. For sleep 30 tablet 0    Musculoskeletal: UTO, camera  Psychiatric Specialty Exam: Physical Exam  Review of Systems  Psychiatric/Behavioral: Positive for  depression and hallucinations. Negative for suicidal ideas.  All other systems reviewed and are negative.   Blood pressure 158/93, pulse (!) 58, temperature 98.2 F (36.8 C), temperature source Oral, resp. rate 16, height '5\' 9"'  (1.753 m), weight 81.6 kg (180 lb), SpO2 100 %.Body mass index is 26.58 kg/m.  General Appearance: Bizarre and Disheveled  Eye Contact:  Fair  Speech:  Clear and Coherent and Normal Rate  Volume:  Increased   Mood:  Angry, Anxious and Depressed  Affect:  Appropriate, Congruent, Depressed and Labile  Thought Process:  Coherent, Goal Directed and Descriptions of Associations: Loose  Orientation:  Full (Time, Place, and Person)  Thought Content:  Suicidal plan/intent/ideation  Suicidal Thoughts:  yes with plan to OD  Homicidal Thoughts:  No  Memory:  Immediate;   Fair Recent;   Fair  Judgement:  Fair  Insight:  Fair  Psychomotor Activity:  Normal  Concentration:  Concentration: Fair and Attention Span: Fair  Recall:  [employer] of Knowledge:  Fair  Language:  Fair  Akathisia:  No  Handed:    AIMS (if indicated):     Assets:  Communication Skills Desire for Improvement Physical Health Resilience Social Support  ADL's:  Intact  Cognition:  WNL  Sleep:      Treatment Plan Summary: Cocaine use disorder, severe, dependence ([place]) with schizoaffective, bipolar, depressed, and suicidal ideation, unstable, managed as below.  Medications: Continue as on chart from home meds -Please consider adding Remeron 7.8m po QHS for his depressive symptoms as there are no alpha or serotonergic medications on his chart from home or here.   Disposition: Recommend psychiatric Inpatient admission when medically cleared.  WBenjamine Mola FNorth Carolina10/03/2016 4:31 PM  Agree with NP note and assessment

## 2016-09-03 NOTE — ED Notes (Signed)
Patient came to nurses station and asked for a front desk number.   I asked patient what he was needing.   Patient states "i don't have to tell you, you fat bitch".  Charge RN had already went and talked to patient and he wouldn't tell her what he wanted.  I went to patient's room to ask what he needed.  Patient was told not to be abusive to staff.  Patient states "i can do what I want, you fat bitch".  Security was called to room to talk with patient.   Patient seemed to calm down some while speaking with security.   Barbara Cower, RN Sales promotion account executive) came and is in with patient now.

## 2016-09-04 NOTE — ED Notes (Signed)
Pt requesting to take shower.  Housekeeping paged.

## 2016-09-04 NOTE — ED Notes (Signed)
Pt refused vitals 

## 2016-09-04 NOTE — ED Notes (Signed)
Patient was given decaf coke, and a regular diet ordered for lunch.

## 2016-09-04 NOTE — Progress Notes (Signed)
Pt referred to [facility] (per [place]) and [facility] (per August Saucer- unable to confirm pt's referral was received yesterday, advised re-fax) Left voicemail for Turner Daniels to inquire as to status of referral sent yesterday and confirmed by Britta Mccreedy was received. Pt declined at [facility]- no reason documented per [facility].  Referred to [facility] with authorization #[id number]  obtained from [facility].  Spoke with Sonya at [facility] intake and provided verbal referral information. Faxed referral for review. Noted pt would have to be under IVC for admission to [facility] if accepted.   Ilean Skill, MSW, [name] Clinical Social Work, Disposition  09/04/2016 [phone]

## 2016-09-04 NOTE — ED Provider Notes (Signed)
Vitals:   09/04/16 0238 09/04/16 1322  BP: 167/99 163/98  Pulse: (!) 53 61  Resp: 18 19  Temp:  98 F (36.7 C)   [place].  Stable for transfer   Linwood Dibbles, MD 09/04/16 [phone]

## 2016-09-04 NOTE — Progress Notes (Signed)
Pt accepted to [facility] behavioral health unit by Dr. Otelia Santee, pending pt be under IVC per Dr. Ronnie [name] request due to [facility] with plan and psychosis.  Per Thayer Ohm, intake RN, once IVC served, call report to [phone]. Informed [facility].  Ilean Skill, MSW, [name] Clinical Social Work, Disposition  09/04/2016 [phone]

## 2016-09-04 NOTE — Congregational Nurse Program (Signed)
Congregational Nurse Program Note  Date of Encounter: 08/18/2016  Past Medical History: Past Medical History:  Diagnosis Date  . Asthma   . Depression   . Homelessness   . Hypertension     Encounter Details:     CNP Questionnaire - 08/18/16 1119      Patient Demographics   Is this a new or existing patient? New   Patient is considered a/an Not Applicable   Race African-American/Black     Patient Assistance   Location of Patient Assistance Not Applicable   Patient's financial/insurance status Low Income;Medicaid   Uninsured Patient No   Patient referred to apply for the following financial assistance Not Applicable   Food insecurities addressed Not Applicable   Transportation assistance No   Assistance securing medications No   Educational health offerings Chronic disease;Behavioral health     Encounter Details   Primary purpose of visit Chronic Illness/Condition Visit;Navigating the Healthcare System;Spiritual Care/Support Visit   Was an Emergency Department visit averted? Not Applicable   Does patient have a medical provider? No   Patient referred to Clinic   Was a mental health screening completed? (GAINS tool) No   Does patient have dental issues? No   Does patient have vision issues? No   Does your patient have an abnormal blood pressure today? No   Since previous encounter, have you referred patient for abnormal blood pressure that resulted in a new diagnosis or medication change? No   Does your patient have an abnormal blood glucose today? No   Since previous encounter, have you referred patient for abnormal blood glucose that resulted in a new diagnosis or medication change? No   Was there a life-saving intervention made? No      Is currently living at the [facility].  Previously lived in [place].  Is in need of case management.   Client is waiting for his WS case manager to contact him.  Instructed client to return to clinic next week to discuss  plan and assistance for ongoing behavioral health care

## 2016-09-04 NOTE — ED Notes (Signed)
Pt refused 0600 vitals at this time

## 2016-09-06 ENCOUNTER — Encounter (HOSPITAL_COMMUNITY): Payer: Self-pay | Admitting: *Deleted

## 2016-09-06 ENCOUNTER — Encounter (HOSPITAL_COMMUNITY): Payer: Self-pay

## 2016-09-06 ENCOUNTER — Encounter (HOSPITAL_COMMUNITY): Payer: Self-pay | Admitting: Oncology

## 2016-09-06 ENCOUNTER — Emergency Department (HOSPITAL_COMMUNITY)
Admission: EM | Admit: 2016-09-06 | Discharge: 2016-09-06 | Disposition: A | Discharge status: Home / Self Care | Payer: Medicaid Other | Source: Home / Self Care | Attending: Emergency Medicine | Admitting: Emergency Medicine

## 2016-09-06 ENCOUNTER — Emergency Department (EMERGENCY_DEPARTMENT_HOSPITAL)
Admission: EM | Admit: 2016-09-06 | Discharge: 2016-09-06 | Disposition: A | Discharge status: Home / Self Care | Payer: Medicaid Other | Source: Home / Self Care | Attending: Physician Assistant | Admitting: Physician Assistant

## 2016-09-06 ENCOUNTER — Emergency Department (HOSPITAL_COMMUNITY)
Admission: EM | Admit: 2016-09-06 | Discharge: 2016-09-07 | Disposition: A | Discharge status: Home / Self Care | Payer: Medicaid Other | Attending: Emergency Medicine | Admitting: Emergency Medicine

## 2016-09-06 DIAGNOSIS — R45851 Suicidal ideations: Secondary | ICD-10-CM | POA: Diagnosis present

## 2016-09-06 DIAGNOSIS — F1414 Cocaine abuse with cocaine-induced mood disorder: Secondary | ICD-10-CM | POA: Diagnosis present

## 2016-09-06 DIAGNOSIS — F1420 Cocaine dependence, uncomplicated: Secondary | ICD-10-CM | POA: Diagnosis present

## 2016-09-06 DIAGNOSIS — F1490 Cocaine use, unspecified, uncomplicated: Secondary | ICD-10-CM

## 2016-09-06 DIAGNOSIS — I10 Essential (primary) hypertension: Secondary | ICD-10-CM

## 2016-09-06 DIAGNOSIS — F17210 Nicotine dependence, cigarettes, uncomplicated: Secondary | ICD-10-CM | POA: Insufficient documentation

## 2016-09-06 DIAGNOSIS — F1994 Other psychoactive substance use, unspecified with psychoactive substance-induced mood disorder: Secondary | ICD-10-CM

## 2016-09-06 DIAGNOSIS — F1099 Alcohol use, unspecified with unspecified alcohol-induced disorder: Secondary | ICD-10-CM

## 2016-09-06 DIAGNOSIS — F1910 Other psychoactive substance abuse, uncomplicated: Secondary | ICD-10-CM | POA: Diagnosis not present

## 2016-09-06 DIAGNOSIS — F331 Major depressive disorder, recurrent, moderate: Secondary | ICD-10-CM

## 2016-09-06 DIAGNOSIS — Z79899 Other long term (current) drug therapy: Secondary | ICD-10-CM

## 2016-09-06 DIAGNOSIS — Z7952 Long term (current) use of systemic steroids: Secondary | ICD-10-CM | POA: Insufficient documentation

## 2016-09-06 DIAGNOSIS — Z813 Family history of other psychoactive substance abuse and dependence: Secondary | ICD-10-CM

## 2016-09-06 DIAGNOSIS — J45909 Unspecified asthma, uncomplicated: Secondary | ICD-10-CM | POA: Insufficient documentation

## 2016-09-06 DIAGNOSIS — F1410 Cocaine abuse, uncomplicated: Secondary | ICD-10-CM

## 2016-09-06 LAB — COMPREHENSIVE METABOLIC PANEL
ALBUMIN: 4.2 g/dL (ref 3.5–5.0)
ALK PHOS: 57 U/L (ref 38–126)
ALT: 46 U/L (ref 17–63)
ALT: 47 U/L (ref 17–63)
ANION GAP: 11 (ref 5–15)
AST: 56 U/L — AB (ref 15–41)
AST: 59 U/L — ABNORMAL HIGH (ref 15–41)
Albumin: 4.9 g/dL (ref 3.5–5.0)
Alkaline Phosphatase: 64 U/L (ref 38–126)
Anion gap: 10 (ref 5–15)
BILIRUBIN TOTAL: 0.8 mg/dL (ref 0.3–1.2)
BUN: 15 mg/dL (ref 6–20)
BUN: 18 mg/dL (ref 6–20)
CALCIUM: 9.8 mg/dL (ref 8.9–10.3)
CHLORIDE: 105 mmol/L (ref 101–111)
CO2: 25 mmol/L (ref 22–32)
CO2: 23 mmol/L (ref 22–32)
CREATININE: 1.04 mg/dL (ref 0.61–1.24)
Calcium: 9.8 mg/dL (ref 8.9–10.3)
Chloride: 104 mmol/L (ref 101–111)
Creatinine, Ser: 1.08 mg/dL (ref 0.61–1.24)
GFR calc non Af Amer: >60 mL/min (ref >60)
GLUCOSE: 87 mg/dL (ref 65–99)
Glucose, Bld: 98 mg/dL (ref 65–99)
POTASSIUM: 4.4 mmol/L (ref 3.5–5.1)
POTASSIUM: 3.9 mmol/L (ref 3.5–5.1)
SODIUM: 140 mmol/L (ref 135–145)
Sodium: 138 mmol/L (ref 135–145)
Total Bilirubin: 0.9 mg/dL (ref 0.3–1.2)
Total Protein: 8.5 g/dL — ABNORMAL HIGH (ref 6.5–8.1)
Total Protein: 7.3 g/dL (ref 6.5–8.1)

## 2016-09-06 LAB — CBC
HCT: 44.8 % (ref 39.0–52.0)
HEMATOCRIT: 45.8 % (ref 39.0–52.0)
HEMOGLOBIN: 15.3 g/dL (ref 13.0–17.0)
HEMOGLOBIN: 15.4 g/dL (ref 13.0–17.0)
MCH: 33.5 pg (ref 26.0–34.0)
MCH: 33.1 pg (ref 26.0–34.0)
MCHC: 34.2 g/dL (ref 30.0–36.0)
MCHC: 33.6 g/dL (ref 30.0–36.0)
MCV: 98 fL (ref 78.0–100.0)
MCV: 98.5 fL (ref 78.0–100.0)
PLATELETS: 238 10*3/uL (ref 150–400)
Platelets: 254 10*3/uL (ref 150–400)
RBC: 4.57 MIL/uL (ref 4.22–5.81)
RBC: 4.65 MIL/uL (ref 4.22–5.81)
RDW: 14.3 % (ref 11.5–15.5)
RDW: 14.1 % (ref 11.5–15.5)
WBC: 6.1 10*3/uL (ref 4.0–10.5)
WBC: 6.7 10*3/uL (ref 4.0–10.5)

## 2016-09-06 LAB — RAPID URINE DRUG SCREEN, HOSP PERFORMED
AMPHETAMINES: NOT DETECTED
AMPHETAMINES: NOT DETECTED
Amphetamines: NOT DETECTED
BARBITURATES: NOT DETECTED
BENZODIAZEPINES: NOT DETECTED
BENZODIAZEPINES: NOT DETECTED
BENZODIAZEPINES: NOT DETECTED
Barbiturates: NOT DETECTED
Barbiturates: NOT DETECTED
COCAINE: POSITIVE — AB
COCAINE: POSITIVE — AB
COCAINE: POSITIVE — AB
OPIATES: NOT DETECTED
Opiates: NOT DETECTED
Opiates: NOT DETECTED
TETRAHYDROCANNABINOL: NOT DETECTED
TETRAHYDROCANNABINOL: NOT DETECTED
Tetrahydrocannabinol: NOT DETECTED

## 2016-09-06 LAB — SALICYLATE LEVEL: Salicylate Lvl: <7 mg/dL (ref 2.8–30.0)

## 2016-09-06 LAB — ETHANOL: Alcohol, Ethyl (B): <5 mg/dL (ref <5)

## 2016-09-06 LAB — ACETAMINOPHEN LEVEL: Acetaminophen (Tylenol), Serum: <10 ug/mL — ABNORMAL LOW (ref 10–30)

## 2016-09-06 MED ORDER — LISINOPRIL 40 MG PO TABS
40.0000 mg | ORAL_TABLET | Freq: Every day | ORAL | Status: DC
  Administered 2016-09-06: 40 mg via ORAL
  Filled 2016-09-06 (×2): qty 1

## 2016-09-06 MED ORDER — GABAPENTIN 300 MG PO CAPS: 300.0000 mg | ORAL_CAPSULE | Freq: Three times a day (TID) | ORAL | Status: DC

## 2016-09-06 MED ORDER — TRAZODONE HCL 100 MG PO TABS: 100.0000 mg | ORAL_TABLET | Freq: Every day | ORAL | Status: DC

## 2016-09-06 MED ORDER — CLOTRIMAZOLE 1 % EX CREA
TOPICAL_CREAM | Freq: Two times a day (BID) | CUTANEOUS | Status: DC
  Administered 2016-09-06: 13:00:00 via TOPICAL
  Filled 2016-09-06 (×2): qty 15

## 2016-09-06 MED ORDER — LISINOPRIL 20 MG PO TABS
40.0000 mg | ORAL_TABLET | Freq: Once | ORAL | Status: AC
  Administered 2016-09-06: 40 mg via ORAL
  Filled 2016-09-06: qty 2

## 2016-09-06 MED ORDER — ALBUTEROL SULFATE HFA 108 (90 BASE) MCG/ACT IN AERS: 1.0000 | INHALATION_SPRAY | RESPIRATORY_TRACT | Status: DC | PRN

## 2016-09-06 MED ORDER — NICOTINE 21 MG/24HR TD PT24: 21.0000 mg | MEDICATED_PATCH | Freq: Every day | TRANSDERMAL | Status: DC

## 2016-09-06 MED ORDER — DIVALPROEX SODIUM 500 MG PO DR TAB: 500.0000 mg | DELAYED_RELEASE_TABLET | Freq: Two times a day (BID) | ORAL | Status: DC

## 2016-09-06 MED ORDER — LORAZEPAM 1 MG PO TABS: 0.0000 mg | ORAL_TABLET | Freq: Two times a day (BID) | ORAL | Status: DC

## 2016-09-06 MED ORDER — LISINOPRIL 20 MG PO TABS
40.0000 mg | ORAL_TABLET | Freq: Every day | ORAL | Status: DC
  Filled 2016-09-06 (×2): qty 2

## 2016-09-06 MED ORDER — HYDROCERIN EX CREA
TOPICAL_CREAM | Freq: Two times a day (BID) | CUTANEOUS | Status: DC
  Administered 2016-09-06: 13:00:00 via TOPICAL
  Filled 2016-09-06 (×2): qty 113

## 2016-09-06 MED ORDER — LORAZEPAM 1 MG PO TABS: 0.0000 mg | ORAL_TABLET | Freq: Four times a day (QID) | ORAL | Status: DC

## 2016-09-06 NOTE — ED Notes (Signed)
Escorted pt to treatment room, all belongings escorted to pod B with room. Pt looking for backpack that was not with his belongings.

## 2016-09-06 NOTE — ED Notes (Signed)
Pt watching tv, waiting for dc.  Delays explained (waiting for medications), snack given

## 2016-09-06 NOTE — ED Notes (Signed)
TTS in for eval

## 2016-09-06 NOTE — ED Notes (Signed)
Up tot he bathroom to shower and change scrubs 

## 2016-09-06 NOTE — ED Provider Notes (Signed)
[facility]-EMERGENCY DEPT Provider Note   CSN: [id number] Arrival date & time: 09/06/16  0400     History   Chief Complaint Chief Complaint  Patient presents with  . Suicidal    [name] is a 50 y.o. male.  HPI   50 yo long standing psychiatric history (bipolar, patient states "every psych disease") feels that "this world is too much".  I believe he was just discharge from psych facility.  He is talking about soemthing that occurred at [facility], referring to the name of a provider and a secuirity guard that he interacted with. Hard to follow paitent's narrative.   Endorses drug use  Past Medical History:  Diagnosis Date  . Asthma   . Depression   . Homelessness   . Hypertension     Patient Active Problem List   Diagnosis Date Noted  . Alcohol use disorder, mild, abuse 07/31/2016  . Schizoaffective disorder, bipolar type (HCC) 05/31/2016  . Cocaine use disorder, severe, dependence (HCC) 05/31/2016  . Essential hypertension 05/31/2016    History reviewed. No pertinent surgical history.     Home Medications    Prior to Admission medications   Medication Sig Start Date End Date Taking? Authorizing Provider  albuterol (PROVENTIL HFA;VENTOLIN HFA) 108 (90 Base) MCG/ACT inhaler Inhale 1-2 puffs into the lungs every 4 (four) hours as needed for shortness of breath.   Yes Historical Provider, MD  gabapentin (NEURONTIN) 300 MG capsule Take 1 capsule (300 mg total) by mouth 3 (three) times daily. 08/06/16  Yes Adonis Brook, NP  lisinopril (PRINIVIL,ZESTRIL) 40 MG tablet Take 1 tablet (40 mg total) by mouth daily. 08/15/16  Yes Mercedes Camprubi-Soms, PA-C  divalproex (DEPAKOTE ER) 500 MG 24 hr tablet Take 1 tablet (500 mg total) by mouth 2 (two) times daily. Patient not taking: Reported on 09/06/2016 08/06/16   Adonis Brook, NP  hydrOXYzine (ATARAX/VISTARIL) 25 MG tablet Take 1 tablet (25 mg total) by mouth every 6 (six) hours as needed for anxiety. Patient  not taking: Reported on 09/06/2016 08/06/16   Adonis Brook, NP  predniSONE (DELTASONE) 20 MG tablet Take 2 tablets (40 mg total) by mouth daily. Patient not taking: Reported on 09/06/2016 08/24/16   Renne Crigler, PA-C  traZODone (DESYREL) 100 MG tablet Take 1 tablet (100 mg total) by mouth at bedtime. For sleep Patient not taking: Reported on 09/06/2016 08/05/16   Adonis Brook, NP    Family History Family History  Problem Relation Age of Onset  . Drug abuse Mother   . Suicidality Mother     Social History Social History  Substance Use Topics  . Smoking status: Current Every Day Smoker    Types: Cigarettes  . Smokeless tobacco: Never Used  . Alcohol use Yes     Comment: infrequently     Allergies   Tylenol [acetaminophen]   Review of Systems Review of Systems  Psychiatric/Behavioral: Positive for dysphoric mood, self-injury and suicidal ideas.  All other systems reviewed and are negative.    Physical Exam Updated Vital Signs BP (!) 168/103 (BP Location: Right Arm)   Pulse 78   Temp 98.3 F (36.8 C) (Oral)   Resp 16   Ht 5\' 9"  (1.753 m)   Wt 180 lb (81.6 kg)   SpO2 99%   BMI 26.58 kg/m   Physical Exam  Constitutional: He is oriented to person, place, and time. He appears well-nourished.  HENT:  Head: Normocephalic.  Eyes: Conjunctivae are normal.  Cardiovascular: Normal rate and  regular rhythm.   Pulmonary/Chest: Effort normal and breath sounds normal.  Neurological: He is oriented to person, place, and time.  Skin: Skin is warm and dry. He is not diaphoretic.  Bilateral feet with fungus  Psychiatric: He has a normal mood and affect.  Endorses SI     ED Treatments / Results  Labs (all labs ordered are listed, but only abnormal results are displayed) Labs Reviewed  COMPREHENSIVE METABOLIC PANEL - Abnormal; Notable for the following:       Result Value   Total Protein 8.5 (*)    AST 56 (*)    All other components within normal limits  ACETAMINOPHEN  LEVEL - Abnormal; Notable for the following:    Acetaminophen (Tylenol), Serum <10 (*)    All other components within normal limits  URINE RAPID DRUG SCREEN, HOSP PERFORMED - Abnormal; Notable for the following:    Cocaine POSITIVE (*)    All other components within normal limits  ETHANOL  SALICYLATE LEVEL  CBC    EKG  EKG Interpretation None       Radiology No results found.  Procedures Procedures (including critical care time)  Medications Ordered in ED Medications  albuterol (PROVENTIL HFA;VENTOLIN HFA) 108 (90 Base) MCG/ACT inhaler 1-2 puff (not administered)  gabapentin (NEURONTIN) capsule 300 mg (not administered)  lisinopril (PRINIVIL,ZESTRIL) tablet 40 mg (not administered)  hydrocerin (EUCERIN) cream (not administered)  clotrimazole (LOTRIMIN) 1 % cream (not administered)     Initial Impression / Assessment and Plan / ED Course  I have reviewed the triage vital signs and the nursing notes.  Pertinent labs & imaging results that were available during my care of the patient were reviewed by me and considered in my medical decision making (see chart for details).  Clinical Course    Pt here with SI.  Long standing psych history,  NOt taking depakote because he doesn't like the way it makes him feel. Only somatic complaint is itchy feet.  Will provide cream and have TTS see patient.   Final Clinical Impressions(s) / ED Diagnoses   Final diagnoses:  None    New Prescriptions New Prescriptions   No medications on file     Rebbecca Osuna Randall AnLyn Cullen Lahaie, MD 09/06/16 [phone]

## 2016-09-06 NOTE — ED Notes (Addendum)
VORB Dr Erline Levine send lotrimin and eucern cream home with pt

## 2016-09-06 NOTE — ED Triage Notes (Signed)
The pt reports that he  Is suicidal  He just left a faciality fridasy.

## 2016-09-06 NOTE — ED Notes (Addendum)
Went to update patient V/S. Patient stated " I refuse ", before I could get into the door.

## 2016-09-06 NOTE — ED Provider Notes (Signed)
[facility]-EMERGENCY DEPT Provider Note   CSN: [id number] Arrival date & time: 09/06/16  2126  Level V caveat psychiatric complaint   History   Chief Complaint Chief Complaint  Patient presents with  . Suicidal    [name] is a 50 y.o. male.Patient reports that he is feeling suicidal plan is to jump off a bridge. He is hearing voices which are telling him to jump off a bridge. He also reports depression over loss of custody of his children. No treatment prior to coming here. Patient was seen at [facility] emergency department earlier today presents here as she's feeling that he is unsafe to go home. No other symptoms. He admits to using alcohol yesterday and snorting cocaine yesterday. Denies IV drug use  HPI  Past Medical History:  Diagnosis Date  . Asthma   . Depression   . Homelessness   . Hypertension     Patient Active Problem List   Diagnosis Date Noted  . Cocaine abuse with cocaine-induced mood disorder (HCC) 09/06/2016  . Alcohol use disorder, mild, abuse 07/31/2016  . Cocaine use disorder, severe, dependence (HCC) 05/31/2016  . Essential hypertension 05/31/2016    History reviewed. No pertinent surgical history.     Home Medications    Prior to Admission medications   Medication Sig Start Date End Date Taking? Authorizing Provider  albuterol (PROVENTIL HFA;VENTOLIN HFA) 108 (90 Base) MCG/ACT inhaler Inhale 1-2 puffs into the lungs every 4 (four) hours as needed for shortness of breath.    Historical Provider, MD  divalproex (DEPAKOTE ER) 500 MG 24 hr tablet Take 1 tablet (500 mg total) by mouth 2 (two) times daily. Patient not taking: Reported on 09/06/2016 08/06/16   Adonis Brook, NP  gabapentin (NEURONTIN) 300 MG capsule Take 1 capsule (300 mg total) by mouth 3 (three) times daily. 08/06/16   Adonis Brook, NP  hydrOXYzine (ATARAX/VISTARIL) 25 MG tablet Take 1 tablet (25 mg total) by mouth every 6 (six) hours as needed for anxiety. Patient not taking:  Reported on 09/06/2016 08/06/16   Adonis Brook, NP  lisinopril (PRINIVIL,ZESTRIL) 40 MG tablet Take 1 tablet (40 mg total) by mouth daily. 08/15/16   Mercedes Camprubi-Soms, PA-C  predniSONE (DELTASONE) 20 MG tablet Take 2 tablets (40 mg total) by mouth daily. Patient not taking: Reported on 09/06/2016 08/24/16   Renne Crigler, PA-C  traZODone (DESYREL) 100 MG tablet Take 1 tablet (100 mg total) by mouth at bedtime. For sleep Patient not taking: Reported on 09/06/2016 08/05/16   Adonis Brook, NP    Family History Family History  Problem Relation Age of Onset  . Drug abuse Mother   . Suicidality Mother     Social History Social History  Substance Use Topics  . Smoking status: Current Every Day Smoker    Types: Cigarettes  . Smokeless tobacco: Never Used  . Alcohol use Yes     Comment: infrequently    Positive cocaine use Allergies   Tylenol [acetaminophen]   Review of Systems Review of Systems  Unable to perform ROS: Psychiatric disorder  Psychiatric/Behavioral: Positive for dysphoric mood, hallucinations and suicidal ideas.       Auditory hallucinations     Physical Exam Updated Vital Signs BP 163/79 (BP Location: Left Arm) Comment: Pt states has hx of hypertension  Pulse 71   Temp 98.7 F (37.1 C) (Oral)   Resp 18   Ht 5\' 9"  (1.753 m)   Wt 180 lb (81.6 kg)   SpO2 100%  BMI 26.58 kg/m   Physical Exam  Constitutional: He is oriented to person, place, and time. He appears well-developed and well-nourished. No distress.  HENT:  Head: Normocephalic and atraumatic.  Right Ear: External ear normal.  Left Ear: External ear normal.  Eyes: EOM are normal.  Neck: Neck supple.  Cardiovascular: Normal rate.   Pulmonary/Chest: Effort normal.  Abdominal: He exhibits no distension.  Neurological: He is alert and oriented to person, place, and time. No cranial nerve deficit.  Skin: Skin is dry.  Psychiatric:  Depressed affect  Nursing note and vitals reviewed.    ED  Treatments / Results  Labs (all labs ordered are listed, but only abnormal results are displayed) Labs Reviewed  COMPREHENSIVE METABOLIC PANEL  ETHANOL  SALICYLATE LEVEL  ACETAMINOPHEN LEVEL  CBC  URINE RAPID DRUG SCREEN, HOSP PERFORMED    EKG  EKG Interpretation None       Radiology No results found.  Procedures Procedures (including critical care time)  Medications Ordered in ED Medications - No data to display   Initial Impression / Assessment and Plan / ED Course  I have reviewed the triage vital signs and the nursing notes.  Pertinent labs & imaging results that were available during my care of the patient were reviewed by me and considered in my medical decision making (see chart for details).  Clinical Course    Labwork from earlier today reviewed. Patient clears medically. TTS consulted for psychiatric evaluation. I feel the patient warrants inpatient psychiatric stay  Final Clinical Impressions(s) / ED Diagnoses  Diagnoses #1 suicidal ideation #2 auditory hallucinations #3 substance abuse Final diagnoses:  None    New Prescriptions New Prescriptions   No medications on file     Doug SouSam Cinda Hara, MD 09/06/16 2227

## 2016-09-06 NOTE — ED Triage Notes (Addendum)
PT BROUGHT IN BY [employer] FOR SUICIDAL IDEATION. PT STS HE IS UNDER A LOT OF STRESS WITH FAMILY ISSUES DUE TO REGAINING CUSTODY OF HIS CHILDREN. PT STS HE HAS BEEN ASKING FOR HELP, BUT IS NOT RECEIVING ANY. PT STS "I JUST WANT TO JUMP OFF OF A BUILDING". PT ALSO STS HE DOES NOT TAKE HIS PSYCH MEDS BECAUSE "THEY DON'T WORK".

## 2016-09-06 NOTE — Discharge Instructions (Signed)
It was our pleasure to provide your ER care today - we hope that you feel better.  Your blood pressure is high - continue your medication and follow up with primary care doctor in the coming week.  For mental health issues and/or crisis go directly to [facility] - they are open 24/7.  Also see additional community resources provided.   Avoid use cocaine and/or other drugs as their use can exacerbate mental health issues - see resource guide provided.   For medical emergency, return to ER.

## 2016-09-06 NOTE — BH Assessment (Signed)
Tele Assessment Note   [name] is an 50 y.o. male. Pt states that he last felt suicidal and homicidal yesterday 09/05/16. Pt states he does not have anyone specifically that he wanted to harm. Pt was inpatient at [facility] and left the hospital because "I didn't feel like they were taking me serious." Pt states he was feelings SI/HI when he left the hospital. Pt states he is suicidal daily.Pt reports auditory hallucinations. Pt states he has had auditory hallucinations all of his life. Pt reports daily cocaine and alcohol use. Pt had multiple hospitalizations for SI and SA. Pt states he does not like outpatient treatment because it does not work. Pt reports the following stressors: job loss, financial concerns, and conflict with DSS. The Pt's children are in DSS custody.  Writer consulted with Dr. Jannifer Franklin and Catha Nottingham, DNP. Per Dr. Jannifer Franklin and Catha Nottingham Pt does not meet inpatient criteria. Pt provided with outpatient resources.   Diagnosis:  F33.1 MDD, recurrent, moderate  Past Medical History:  Past Medical History:  Diagnosis Date  . Asthma   . Depression   . Homelessness   . Hypertension     History reviewed. No pertinent surgical history.  Family History:  Family History  Problem Relation Age of Onset  . Drug abuse Mother   . Suicidality Mother     Social History:  reports that he has been smoking Cigarettes.  He has never used smokeless tobacco. He reports that he drinks alcohol. He reports that he uses drugs, including Cocaine.  Additional Social History:     CIWA: CIWA-Ar BP: (!) 168/103 Pulse Rate: 78 COWS:    PATIENT STRENGTHS: (choose at least two) Average or above average intelligence Communication skills  Allergies:  Allergies  Allergen Reactions  . Tylenol [Acetaminophen] Nausea And Vomiting    Home Medications:  (Not in a hospital admission)  OB/GYN Status:  No LMP for male patient.  General Assessment Data Location of Assessment: [facility] ED TTS  Assessment: In system Is this a Tele or Face-to-Face Assessment?: Tele Assessment Is this an Initial Assessment or a Re-assessment for this encounter?: Initial Assessment Marital status: Single Maiden name: NA Is patient pregnant?: No Pregnancy Status: No Living Arrangements: Other (Comment) (homeless) Can pt return to current living arrangement?: Yes Admission Status: Voluntary Is patient capable of signing voluntary admission?: Yes Referral Source: Self/Family/Friend Insurance type: Medicaid     Crisis Care Plan Living Arrangements: Other (Comment) (homeless) Legal Guardian: Other: (self) Name of Psychiatrist: NA Name of Therapist: NA  Education Status Is patient currently in school?: No Current Grade: 12 Highest grade of school patient has completed: NA Name of school: NA Contact person: NA  Risk to self with the past 6 months Suicidal Ideation: No-Not Currently/Within Last 6 Months Has patient been a risk to self within the past 6 months prior to admission? : Yes Suicidal Intent: No-Not Currently/Within Last 6 Months Has patient had any suicidal intent within the past 6 months prior to admission? : Yes Is patient at risk for suicide?: No Suicidal Plan?: No-Not Currently/Within Last 6 Months Has patient had any suicidal plan within the past 6 months prior to admission? : Yes Specify Current Suicidal Plan: to walk in traffic Access to Means: Yes Specify Access to Suicidal Means: access to traffic What has been your use of drugs/alcohol within the last 12 months?: cocaine and alcohol Previous Attempts/Gestures: Yes How many times?: 4 Other Self Harm Risks: NA Triggers for Past Attempts: Unpredictable Intentional Self Injurious Behavior:  None Family Suicide History: Yes Recent stressful life event(s): Job Loss, Financial Problems, Other (Comment) (DSS) Persecutory voices/beliefs?: No Depression: Yes Depression Symptoms: Insomnia, Tearfulness, Feeling worthless/self  pity, Feeling angry/irritable Substance abuse history and/or treatment for substance abuse?: Yes Suicide prevention information given to non-admitted patients: Not applicable  Risk to Others within the past 6 months Homicidal Ideation: No Does patient have any lifetime risk of violence toward others beyond the six months prior to admission? : No Thoughts of Harm to Others: No-Not Currently Present/Within Last 6 Months Comment - Thoughts of Harm to Others: NA Current Homicidal Intent: No Current Homicidal Plan: No Describe Current Homicidal Plan: NA Access to Homicidal Means: No Describe Access to Homicidal Means: NA Identified Victim: NA History of harm to others?: No Assessment of Violence: None Noted Violent Behavior Description: NA Does patient have access to weapons?: No Criminal Charges Pending?: No Does patient have a court date: No Is patient on probation?: No  Psychosis Hallucinations: Auditory (Pt reports hearing voices) Delusions: None noted  Mental Status Report Appearance/Hygiene: In scrubs Eye Contact: Poor Motor Activity: Freedom of movement Speech: Logical/coherent, Pressured Level of Consciousness: Alert, Restless, Irritable Mood: Sad Affect: Sad Anxiety Level: Minimal Thought Processes: Relevant Judgement: Unimpaired Orientation: Person, Place, Time, Situation Obsessive Compulsive Thoughts/Behaviors: None  Cognitive Functioning Concentration: Normal Memory: Recent Intact, Remote Intact IQ: Average Insight: Poor Impulse Control: Poor Appetite: Fair Weight Loss: 0 Weight Gain: 0 Sleep: Decreased Total Hours of Sleep: 4 Vegetative Symptoms: None  ADLScreening [facility] Assessment Services) Patient's cognitive ability adequate to safely complete daily activities?: Yes Patient able to express need for assistance with ADLs?: Yes Independently performs ADLs?: Yes (appropriate for developmental age)  Prior Inpatient Therapy Prior Inpatient Therapy:  Yes Prior Therapy Dates: pt was unsure Prior Therapy Facilty/Provider(s): unknown Reason for Treatment: SA/ SI  Prior Outpatient Therapy Prior Outpatient Therapy: No Prior Therapy Dates: NA Prior Therapy Facilty/Provider(s): NA Reason for Treatment: NA Does patient have an ACCT team?: No Does patient have Intensive In-House Services?  : No Does patient have [facility] services? : No Does patient have P4CC services?: No  ADL Screening (condition at time of admission) Patient's cognitive ability adequate to safely complete daily activities?: Yes Is the patient deaf or have difficulty hearing?: No Does the patient have difficulty seeing, even when wearing glasses/contacts?: No Does the patient have difficulty concentrating, remembering, or making decisions?: No Patient able to express need for assistance with ADLs?: Yes Does the patient have difficulty dressing or bathing?: No Independently performs ADLs?: Yes (appropriate for developmental age) Does the patient have difficulty walking or climbing stairs?: No Weakness of Legs: None Weakness of Arms/Hands: None       Abuse/Neglect Assessment (Assessment to be complete while patient is alone) Physical Abuse: Denies Verbal Abuse: Denies Sexual Abuse: Denies Exploitation of patient/patient's resources: Denies Self-Neglect: Denies     Merchant navy officerAdvance Directives (For Healthcare) Does patient have an advance directive?: No Would patient like information on creating an advanced directive?: No - patient declined information    Additional Information 1:1 In Past 12 Months?: No CIRT Risk: No Elopement Risk: No Does patient have medical clearance?: Yes     Disposition:  Disposition Initial Assessment Completed for this Encounter: Yes Disposition of Patient: Outpatient treatment Type of outpatient treatment: Adult  Mirelle Biskup D 09/06/2016 10:59 AM

## 2016-09-06 NOTE — [facility] Suicide Risk Assessment (Signed)
Suicide Risk Assessment  Discharge Assessment   [facility] Discharge Suicide Risk Assessment   Principal Problem: Cocaine abuse with cocaine-induced mood disorder [facility]) Discharge Diagnoses:  Patient Active Problem List   Diagnosis Date Noted  . Cocaine abuse with cocaine-induced mood disorder (HCC) [F14.14] 09/06/2016    Priority: High  . Cocaine use disorder, severe, dependence (HCC) [F14.20] 05/31/2016    Priority: High  . Alcohol use disorder, mild, abuse [F10.10] 07/31/2016  . Essential hypertension [I10] 05/31/2016    Total Time spent with patient: 45 minutes  Musculoskeletal: Strength & Muscle Tone: within normal limits Gait & Station: normal Patient leans: N/A  Psychiatric Specialty Exam: Physical Exam  Constitutional: He is oriented to person, place, and time. He appears well-developed and well-nourished.  HENT:  Head: Normocephalic.  Neck: Normal range of motion.  Respiratory: Effort normal.  Musculoskeletal: Normal range of motion.  Neurological: He is alert and oriented to person, place, and time.  Skin: Skin is warm and dry.  Psychiatric: His speech is normal and behavior is normal. Judgment and thought content normal. Cognition and memory are normal. He exhibits a depressed mood.    Review of Systems  Constitutional: Negative.   HENT: Negative.   Eyes: Negative.   Respiratory: Negative.   Cardiovascular: Negative.   Gastrointestinal: Negative.   Genitourinary: Negative.   Musculoskeletal: Negative.   Skin: Negative.   Neurological: Negative.   Endo/Heme/Allergies: Negative.   Psychiatric/Behavioral: Positive for depression and substance abuse.    Blood pressure (!) 168/103, pulse 78, temperature 98.3 F (36.8 C), temperature source Oral, resp. rate 16, height 5\' 9"  (1.753 m), weight 81.6 kg (180 lb), SpO2 99 %.Body mass index is 26.58 kg/m.  General Appearance: Casual  Eye Contact:  Good  Speech:  Normal Rate  Volume:  Normal  Mood:  Depressed, mild   Affect:  Congruent  Thought Process:  Coherent and Descriptions of Associations: Intact  Orientation:  Full (Time, Place, and Person)  Thought Content:  WDL  Suicidal Thoughts:  No  Homicidal Thoughts:  No  Memory:  Immediate;   Good Recent;   Good Remote;   Good  Judgement:  Fair  Insight:  Fair  Psychomotor Activity:  Normal  Concentration:  Concentration: Good and Attention Span: Good  Recall:  Good  Fund of Knowledge:  Fair  Language:  Fair  Akathisia:  No  Handed:  Right  AIMS (if indicated):     Assets:  Leisure Time Physical Health Resilience  ADL's:  Intact  Cognition:  WNL  Sleep:       Mental Status Per Nursing Assessment::   On Admission:   cocaine abuse with suicidal ideations  Demographic Factors:  Male  Loss Factors: NA  Historical Factors: NA  Risk Reduction Factors:   Sense of responsibility to family  Continued Clinical Symptoms:  Depression, mild  Cognitive Features That Contribute To Risk:  None    Suicide Risk:  Minimal: No identifiable suicidal ideation.  Patients presenting with no risk factors but with morbid ruminations; may be classified as minimal risk based on the severity of the depressive symptoms    Plan Of Care/Follow-up recommendations:  Activity:  as tolerated Diet:  heart healthy diet  Bailey Kolbe, NP 09/06/2016, 10:57 AM

## 2016-09-06 NOTE — ED Notes (Signed)
Bed: WTR6 Expected date:  Expected time:  Means of arrival:  Comments: 

## 2016-09-06 NOTE — ED Notes (Signed)
Pt discharged. Attempted to review dc paper and get vs. Pt refused to review papers and would not take his copy from me. Pt did attempt to sign, but pen and pad not working.  Pt escorted to wr by security

## 2016-09-06 NOTE — ED Notes (Addendum)
Written dc instructions reviewed w/ pt.  Pt encouraged to take his medications as directed and follow up w/ OP resources provided.  Pt denies si/hi/vh on dc, but is still having AH "always here voices."  Pt encouraged to seek treatment for any changes in si/hi/vh or concerns.  Pt verbalized understanding.  Pt ambulatory w/o difficulty dc w/ mHt, belongings returned after leaving the area.

## 2016-09-06 NOTE — ED Notes (Addendum)
Dr Lenore Cordia into see

## 2016-09-06 NOTE — ED Notes (Signed)
EDP into see 

## 2016-09-06 NOTE — ED Notes (Signed)
Pt meal tray at bedside.

## 2016-09-06 NOTE — Consult Note (Signed)
[facility] Consult   Reason for Consult:  Cocaine abuse with suicidal ideations Referring Physician:  EDP Patient Identification: [name] MRN:  [id number] Principal Diagnosis: Cocaine abuse with cocaine-induced mood disorder [facility]) Diagnosis:   Patient Active Problem List   Diagnosis Date Noted  . Cocaine abuse with cocaine-induced mood disorder ([place]) [F14.14] 09/06/2016    Priority: High  . Cocaine use disorder, severe, dependence ([place]) [F14.20] 05/31/2016    Priority: High  . Alcohol use disorder, mild, abuse [F10.10] 07/31/2016  . Essential hypertension [I10] 05/31/2016    Total Time spent with patient: 45 minutes  Subjective:   [name] is a 50 y.o. male patient does not warrant admission.  HPI:  50 yo male who presented to the ED under the influence of cocaine, complaining of suicidal ideations and hallucinations.  He was at [facility] and transferred to [facility] but discharged on admission due to his behaviors.  Odell then came to [facility].  He is not responding to internal stimuli nor having suicidal/homicidal ideations or withdrawal symptoms.  Reinhart is demanding and argumentative.  Educated on the use of cocaine and hallucinations.  Stable for discharge.   Past Psychiatric History: substance abuse  Risk to Self:no Risk to Others:  none Prior Inpatient Therapy:  multiple Prior Outpatient Therapy:  none currently   Past Medical History:  Past Medical History:  Diagnosis Date  . Asthma   . Depression   . Homelessness   . Hypertension    History reviewed. No pertinent surgical history. Family History:  Family History  Problem Relation Age of Onset  . Drug abuse Mother   . Suicidality Mother    Family Psychiatric  History: none Social History:  History  Alcohol Use  . Yes    Comment: infrequently     History  Drug Use  . Types: Cocaine    Social History   Social History  . Marital status: Single    Spouse name: N/A  . Number  of children: N/A  . Years of education: N/A   Social History Main Topics  . Smoking status: Current Every Day Smoker    Types: Cigarettes  . Smokeless tobacco: Never Used  . Alcohol use Yes     Comment: infrequently  . Drug use:     Types: Cocaine  . Sexual activity: Not Asked   Other Topics Concern  . None   Social History Narrative  . None   Additional Social History:    Allergies:   Allergies  Allergen Reactions  . Tylenol [Acetaminophen] Nausea And Vomiting    Labs:  Results for orders placed or performed during the hospital encounter of 09/06/16 (from the past 48 hour(s))  Comprehensive metabolic panel     Status: Abnormal   Collection Time: 09/06/16  4:41 AM  Result Value Ref Range   Sodium 140 135 - 145 mmol/L   Potassium 4.4 3.5 - 5.1 mmol/L   Chloride 105 101 - 111 mmol/L   CO2 25 22 - 32 mmol/L   Glucose, Bld 98 65 - 99 mg/dL   BUN 15 6 - 20 mg/dL   Creatinine, Ser 1.04 0.61 - 1.24 mg/dL   Calcium 9.8 8.9 - 10.3 mg/dL   Total Protein 8.5 (H) 6.5 - 8.1 g/dL   Albumin 4.9 3.5 - 5.0 g/dL   AST 56 (H) 15 - 41 U/L   ALT 46 17 - 63 U/L   Alkaline Phosphatase 64 38 - 126 U/L   Total  Bilirubin 0.9 0.3 - 1.2 mg/dL   GFR calc non Af Amer >60 >60 mL/min   GFR calc Af Amer >60 >60 mL/min    Comment: (NOTE) The eGFR has been calculated using the CKD EPI equation. This calculation has not been validated in all clinical situations. eGFR's persistently <60 mL/min signify possible Chronic Kidney Disease.    Anion gap 10 5 - 15  cbc     Status: None   Collection Time: 09/06/16  4:41 AM  Result Value Ref Range   WBC 6.1 4.0 - 10.5 K/uL   RBC 4.57 4.22 - 5.81 MIL/uL   Hemoglobin 15.3 13.0 - 17.0 g/dL   HCT 44.8 39.0 - 52.0 %   MCV 98.0 78.0 - 100.0 fL   MCH 33.5 26.0 - 34.0 pg   MCHC 34.2 30.0 - 36.0 g/dL   RDW 14.3 11.5 - 15.5 %   Platelets 238 150 - 400 K/uL  Ethanol     Status: None   Collection Time: 09/06/16  4:42 AM  Result Value Ref Range   Alcohol,  Ethyl (B) <5 <5 mg/dL    Comment:        LOWEST DETECTABLE LIMIT FOR SERUM ALCOHOL IS 5 mg/dL FOR MEDICAL PURPOSES ONLY   Salicylate level     Status: None   Collection Time: 09/06/16  4:42 AM  Result Value Ref Range   Salicylate Lvl <1.6 2.8 - 30.0 mg/dL  Acetaminophen level     Status: Abnormal   Collection Time: 09/06/16  4:42 AM  Result Value Ref Range   Acetaminophen (Tylenol), Serum <10 (L) 10 - 30 ug/mL    Comment:        THERAPEUTIC CONCENTRATIONS VARY SIGNIFICANTLY. A RANGE OF 10-30 ug/mL MAY BE AN EFFECTIVE CONCENTRATION FOR MANY PATIENTS. HOWEVER, SOME ARE BEST TREATED AT CONCENTRATIONS OUTSIDE THIS RANGE. ACETAMINOPHEN CONCENTRATIONS >150 ug/mL AT 4 HOURS AFTER INGESTION AND >50 ug/mL AT 12 HOURS AFTER INGESTION ARE OFTEN ASSOCIATED WITH TOXIC REACTIONS.   Rapid urine drug screen (hospital performed)     Status: Abnormal   Collection Time: 09/06/16  4:43 AM  Result Value Ref Range   Opiates NONE DETECTED NONE DETECTED   Cocaine POSITIVE (A) NONE DETECTED   Benzodiazepines NONE DETECTED NONE DETECTED   Amphetamines NONE DETECTED NONE DETECTED   Tetrahydrocannabinol NONE DETECTED NONE DETECTED   Barbiturates NONE DETECTED NONE DETECTED    Comment:        DRUG SCREEN FOR MEDICAL PURPOSES ONLY.  IF CONFIRMATION IS NEEDED FOR ANY PURPOSE, NOTIFY LAB WITHIN 5 DAYS.        LOWEST DETECTABLE LIMITS FOR URINE DRUG SCREEN Drug Class       Cutoff (ng/mL) Amphetamine      1000 Barbiturate      200 Benzodiazepine   109 Tricyclics       604 Opiates          300 Cocaine          300 THC              50     Current Facility-Administered Medications  Medication Dose Route Frequency Provider Last Rate Last Dose  . albuterol (PROVENTIL HFA;VENTOLIN HFA) 108 (90 Base) MCG/ACT inhaler 1-2 puff  1-2 puff Inhalation Q4H PRN Courteney Lyn Mackuen, MD      . clotrimazole (LOTRIMIN) 1 % cream   Topical BID Courteney Lyn Mackuen, MD      . divalproex (DEPAKOTE) DR tablet  500 mg  500 mg Oral BID PC Paublo Warshawsky, MD      . hydrocerin (EUCERIN) cream   Topical BID Courteney Lyn Mackuen, MD      . lisinopril (PRINIVIL,ZESTRIL) tablet 40 mg  40 mg Oral Daily Courteney Lyn Mackuen, MD      . traZODone (DESYREL) tablet 100 mg  100 mg Oral QHS Corena Pilgrim, MD       Current Outpatient Prescriptions  Medication Sig Dispense Refill  . albuterol (PROVENTIL HFA;VENTOLIN HFA) 108 (90 Base) MCG/ACT inhaler Inhale 1-2 puffs into the lungs every 4 (four) hours as needed for shortness of breath.    . gabapentin (NEURONTIN) 300 MG capsule Take 1 capsule (300 mg total) by mouth 3 (three) times daily. 90 capsule 0  . lisinopril (PRINIVIL,ZESTRIL) 40 MG tablet Take 1 tablet (40 mg total) by mouth daily. 30 tablet 0  . divalproex (DEPAKOTE ER) 500 MG 24 hr tablet Take 1 tablet (500 mg total) by mouth 2 (two) times daily. (Patient not taking: Reported on 09/06/2016) 60 tablet 0  . hydrOXYzine (ATARAX/VISTARIL) 25 MG tablet Take 1 tablet (25 mg total) by mouth every 6 (six) hours as needed for anxiety. (Patient not taking: Reported on 09/06/2016) 30 tablet 0  . predniSONE (DELTASONE) 20 MG tablet Take 2 tablets (40 mg total) by mouth daily. (Patient not taking: Reported on 09/06/2016) 8 tablet 0  . traZODone (DESYREL) 100 MG tablet Take 1 tablet (100 mg total) by mouth at bedtime. For sleep (Patient not taking: Reported on 09/06/2016) 30 tablet 0    Musculoskeletal: Strength & Muscle Tone: within normal limits Gait & Station: normal Patient leans: N/A  Psychiatric Specialty Exam: Physical Exam  Constitutional: He is oriented to person, place, and time. He appears well-developed and well-nourished.  HENT:  Head: Normocephalic.  Neck: Normal range of motion.  Respiratory: Effort normal.  Musculoskeletal: Normal range of motion.  Neurological: He is alert and oriented to person, place, and time.  Skin: Skin is warm and dry.  Psychiatric: His speech is normal and behavior is  normal. Judgment and thought content normal. Cognition and memory are normal. He exhibits a depressed mood.    Review of Systems  Constitutional: Negative.   HENT: Negative.   Eyes: Negative.   Respiratory: Negative.   Cardiovascular: Negative.   Gastrointestinal: Negative.   Genitourinary: Negative.   Musculoskeletal: Negative.   Skin: Negative.   Neurological: Negative.   Endo/Heme/Allergies: Negative.   Psychiatric/Behavioral: Positive for depression and substance abuse.    Blood pressure (!) 168/103, pulse 78, temperature 98.3 F (36.8 C), temperature source Oral, resp. rate 16, height '5\' 9"'  (1.753 m), weight 81.6 kg (180 lb), SpO2 99 %.Body mass index is 26.58 kg/m.  General Appearance: Casual  Eye Contact:  Good  Speech:  Normal Rate  Volume:  Normal  Mood:  Depressed, mild  Affect:  Congruent  Thought Process:  Coherent and Descriptions of Associations: Intact  Orientation:  Full (Time, Place, and Person)  Thought Content:  WDL  Suicidal Thoughts:  No  Homicidal Thoughts:  No  Memory:  Immediate;   Good Recent;   Good Remote;   Good  Judgement:  Fair  Insight:  Fair  Psychomotor Activity:  Normal  Concentration:  Concentration: Good and Attention Span: Good  Recall:  Good  Fund of Knowledge:  Fair  Language:  Fair  Akathisia:  No  Handed:  Right  AIMS (if indicated):     Assets:  Leisure Time Physical Health Resilience  ADL's:  Intact  Cognition:  WNL  Sleep:        Treatment Plan Summary: Daily contact with patient to assess and evaluate symptoms and progress in treatment, Medication management and Plan cocaine abuse with cocaine induced mood disorder:  -Crisis stabilization -Medication management:  Medical medications continued along with Depakote 500 mg BID for mood stabilization and Trazodone 100 mg at bedtime for sleep -Individual and substance abuse counseling -[facility] referral  Disposition: No evidence of imminent risk to self or others at  present.    Waylan Boga, NP 09/06/2016 10:49 AM  Patient seen face-to-face for psychiatric evaluation, chart reviewed and case discussed with the physician extender and developed treatment plan. Reviewed the information documented and agree with the treatment plan. Corena Pilgrim, MD

## 2016-09-06 NOTE — ED Notes (Signed)
Bed: [facility]PT4 Expected date:  Expected time:  Means of arrival:  Comments: 

## 2016-09-06 NOTE — BH Assessment (Addendum)
Tele Assessment Note   [name] is an 50 y.o. male presenting to [facility] reporting suicidal ideation with a plan to jump from a bridge; however he did not make any attempts today. Pt stated "I am really dealing with stuff that I know about to cause me to take my life". Pt reported that he has an upcoming hearing in January regarding custody of his children. Pt was seen in the ED 3 times within the past 24 hours. Pt reported that he has attempted suicide multiple times in the past and has had several psychiatric hospitalizations. Pt did not report any current mental health treatment but reported that he is working with his Medicaid case worker to find a psychiatrist and therapist. Pt is currently homeless and unemployed. Pt is endorsing multiple depressive symptoms; however he did not report any issues with his appetite or sleep. Pt denies HI and AVH at this time. Pt shared that he uses cocaine and drink alcohol. No physical, sexual or emotional abuse reported. No traumatic experiences reported.  Per chart review, Pt was seen at [facility] due to suicidal ideation but left because he did not feel as if they were taking him seriously. Pt reported feeling suicidal/homicial when he left [facility]. No attempts to harm self or other were documented.   Diagnosis: Major Depressive Disorder, Recurrent episode, Severe.   Past Medical History:  Past Medical History:  Diagnosis Date  . Asthma   . Depression   . Homelessness   . Hypertension     History reviewed. No pertinent surgical history.  Family History:  Family History  Problem Relation Age of Onset  . Drug abuse Mother   . Suicidality Mother     Social History:  reports that he has been smoking Cigarettes.  He has never used smokeless tobacco. He reports that he drinks alcohol. He reports that he uses drugs, including Cocaine.  Additional Social History:     CIWA: CIWA-Ar BP: 163/79 (Pt states has hx of hypertension) Pulse Rate: 71 COWS:     PATIENT STRENGTHS: (choose at least two) Average or above average intelligence Motivation for treatment/growth  Allergies:  Allergies  Allergen Reactions  . Tylenol [Acetaminophen] Nausea And Vomiting    Home Medications:  (Not in a hospital admission)  OB/GYN Status:  No LMP for male patient.  General Assessment Data [facility] of [facility] Assessment: In system Is this a Tele or Face-to-Face Assessment?: Face-to-Face Is this an Initial Assessment or a Re-assessment for this encounter?: Initial Assessment Marital status: Single Living Arrangements: Other (Comment) (homeless) Can pt return to current living arrangement?: Yes Admission Status: Voluntary Is patient capable of signing voluntary admission?: Yes Referral Source: Self/Family/Friend Insurance type: Medicaid      Crisis Care Plan Living Arrangements: Other (Comment) (homeless) Name of Psychiatrist: NA Name of Therapist: NA  Education Status Is patient currently in school?: No Highest grade of school patient has completed: 12 Name of school: NA Contact person: NA  Risk to self with the past 6 months Suicidal Ideation: Yes-Currently Present Has patient been a risk to self within the past 6 months prior to admission? : Yes Suicidal Intent: Yes-Currently Present Has patient had any suicidal intent within the past 6 months prior to admission? : Yes Is patient at risk for suicide?: Yes Suicidal Plan?: Yes-Currently Present Has patient had any suicidal plan within the past 6 months prior to admission? : Yes Specify Current Suicidal Plan: jump from bridge Access to Means: Yes Specify Access  to Suicidal Means: access to bridge What has been your use of drugs/alcohol within the last 12 months?: Cocaine and alcohol use reported.  Previous Attempts/Gestures: Yes How many times?: 4 Other Self Harm Risks: Denies  Triggers for Past Attempts: Unpredictable Intentional Self Injurious Behavior:  None Family Suicide History: Yes Recent stressful life event(s): Other (Comment) (Job loss, DSS) Persecutory voices/beliefs?: No Depression: Yes Depression Symptoms: Isolating, Despondent, Feeling worthless/self pity, Feeling angry/irritable Substance abuse history and/or treatment for substance abuse?: Yes Suicide prevention information given to non-admitted patients: Not applicable  Risk to Others within the past 6 months Homicidal Ideation: No Does patient have any lifetime risk of violence toward others beyond the six months prior to admission? : No Thoughts of Harm to Others: No Comment - Thoughts of Harm to Others: Denies  Current Homicidal Intent: No Current Homicidal Plan: No Describe Current Homicidal Plan: Denies  Access to Homicidal Means: No Describe Access to Homicidal Means: Denies  Identified Victim: Denies  History of harm to others?: No Assessment of Violence: None Noted Violent Behavior Description: No violent behavior observed. Pt is calm and cooperative at this time.  Does patient have access to weapons?: No Criminal Charges Pending?: No Does patient have a court date: No Is patient on probation?: No  Psychosis Hallucinations: None noted Delusions: None noted  Mental Status Report Appearance/Hygiene: In scrubs Eye Contact: Good Motor Activity: Freedom of movement Speech: Logical/coherent, Pressured Level of Consciousness: Alert Mood: Euthymic Affect: Appropriate to circumstance Anxiety Level: Minimal Thought Processes: Coherent, Relevant Judgement: Partial Orientation: Person, Place, Time, Situation Obsessive Compulsive Thoughts/Behaviors: None  Cognitive Functioning Concentration: Normal Memory: Recent Intact, Remote Intact IQ: Average Insight: Fair Impulse Control: Good Appetite: Good Weight Loss: 0 Weight Gain: 0 Sleep: No Change Total Hours of Sleep: 8 Vegetative Symptoms: None  ADLScreening [facility] Assessment Services) Patient's  cognitive ability adequate to safely complete daily activities?: Yes Patient able to express need for assistance with ADLs?: Yes Independently performs ADLs?: Yes (appropriate for developmental age)  Prior Inpatient Therapy Prior Inpatient Therapy: Yes Prior Therapy Dates: 6/17, 9/17 [facility]  Reason for Treatment: Depression  Prior Outpatient Therapy Prior Outpatient Therapy: No Prior Therapy Dates: NA Prior Therapy Facilty/Provider(s): NA Reason for Treatment: NA Does patient have an ACCT team?: No Does patient have Intensive In-House Services?  : No Does patient have [facility] services? : No Does patient have P4CC services?: No  ADL Screening (condition at time of admission) Patient's cognitive ability adequate to safely complete daily activities?: Yes Is the patient deaf or have difficulty hearing?: No Does the patient have difficulty seeing, even when wearing glasses/contacts?: No Does the patient have difficulty concentrating, remembering, or making decisions?: No Patient able to express need for assistance with ADLs?: Yes Does the patient have difficulty dressing or bathing?: No Independently performs ADLs?: Yes (appropriate for developmental age)       Abuse/Neglect Assessment (Assessment to be complete while patient is alone) Physical Abuse: Denies Verbal Abuse: Denies Sexual Abuse: Denies Exploitation of patient/patient's resources: Denies Self-Neglect: Denies     Merchant navy officerAdvance Directives (For Healthcare) Does patient have an advance directive?: No Would patient like information on creating an advanced directive?: No - patient declined information    Additional Information 1:1 In Past 12 Months?: No CIRT Risk: No Elopement Risk: No Does patient have medical clearance?: Yes     Disposition:  Disposition Initial Assessment Completed for this Encounter: Yes Disposition of Patient: Other dispositions Other disposition(s): Other  (Comment) (AM  Psych eval )  Duron Meister S 09/06/2016 11:46 PM

## 2016-09-06 NOTE — ED Notes (Signed)
Pt. Transferred to [facility] from ED to room 35 after screening for contraband. Report to include Situation, Background, Assessment and Recommendations from RN. Pt. Oriented to unit including Q15 minute rounds as well as the security cameras for their protection. Patient is alert and oriented, warm and dry in no acute distress. Patient denies HI, and VH. Pt. States he has SI to jump off a bridge. Pt. Encouraged to let me know if needs arise.

## 2016-09-06 NOTE — ED Provider Notes (Signed)
[facility]-EMERGENCY DEPT Provider Note   CSN: [id number] Arrival date & time: 09/06/16  1528     History   Chief Complaint Chief Complaint  Patient presents with  . Psychiatric Evaluation    [name] is a 50 y.o. male.  Patient with hx substance abuse induced mood disorder, c/o recent stressors, feeling anxious at times, and states has intermittent, fleeting thoughts of self harm. He denies attempting to harm self, and denies any plan to harm self.  Notes several recent inpatient admissions for same, and states that does not seem to help.  He denies any acute or new stressors. Denies any abrupt change in or worsening of depression. Denies recent physical illness or new symptoms. Normal appetite. Is eating/drinking fine. +intermittent problems w cocaine abuse, denies other drug use. States sleeping ok at night. No wt change.    The history is provided by the patient.    Past Medical History:  Diagnosis Date  . Asthma   . Depression   . Homelessness   . Hypertension     Patient Active Problem List   Diagnosis Date Noted  . Cocaine abuse with cocaine-induced mood disorder (HCC) 09/06/2016  . Alcohol use disorder, mild, abuse 07/31/2016  . Cocaine use disorder, severe, dependence (HCC) 05/31/2016  . Essential hypertension 05/31/2016    History reviewed. No pertinent surgical history.     Home Medications    Prior to Admission medications   Medication Sig Start Date End Date Taking? Authorizing Provider  albuterol (PROVENTIL HFA;VENTOLIN HFA) 108 (90 Base) MCG/ACT inhaler Inhale 1-2 puffs into the lungs every 4 (four) hours as needed for shortness of breath.    Historical Provider, MD  divalproex (DEPAKOTE ER) 500 MG 24 hr tablet Take 1 tablet (500 mg total) by mouth 2 (two) times daily. Patient not taking: Reported on 09/06/2016 08/06/16   Adonis BrookSheila [name], NP  gabapentin (NEURONTIN) 300 MG capsule Take 1 capsule (300 mg total) by mouth 3 (three) times daily. 08/06/16    Adonis BrookSheila [name], NP  hydrOXYzine (ATARAX/VISTARIL) 25 MG tablet Take 1 tablet (25 mg total) by mouth every 6 (six) hours as needed for anxiety. Patient not taking: Reported on 09/06/2016 08/06/16   Adonis BrookSheila [name], NP  lisinopril (PRINIVIL,ZESTRIL) 40 MG tablet Take 1 tablet (40 mg total) by mouth daily. 08/15/16   Mercedes Camprubi-Soms, PA-C  predniSONE (DELTASONE) 20 MG tablet Take 2 tablets (40 mg total) by mouth daily. Patient not taking: Reported on 09/06/2016 08/24/16   Renne CriglerJoshua Geiple, PA-C  traZODone (DESYREL) 100 MG tablet Take 1 tablet (100 mg total) by mouth at bedtime. For sleep Patient not taking: Reported on 09/06/2016 08/05/16   Adonis BrookSheila [name], NP    Family History Family History  Problem Relation Age of Onset  . Drug abuse Mother   . Suicidality Mother     Social History Social History  Substance Use Topics  . Smoking status: Current Every Day Smoker    Types: Cigarettes  . Smokeless tobacco: Never Used  . Alcohol use Yes     Comment: infrequently     Allergies   Tylenol [acetaminophen]   Review of Systems Review of Systems  Constitutional: Negative for fever.  HENT: Negative for sore throat.   Eyes: Negative for redness.  Respiratory: Negative for shortness of breath.   Cardiovascular: Negative for chest pain.  Gastrointestinal: Negative for abdominal pain.  Genitourinary: Negative for flank pain.  Musculoskeletal: Negative for back pain and neck pain.  Skin: Negative for rash.  Neurological:  Negative for headaches.  Hematological: Does not bruise/bleed easily.  Psychiatric/Behavioral: Positive for dysphoric mood. Negative for self-injury. The patient is nervous/anxious.      Physical Exam Updated Vital Signs BP (!) 164/109 (BP Location: Right Arm)   Pulse 82   Resp 18   SpO2 100%   Physical Exam  Constitutional: He appears well-developed and well-nourished. No distress.  HENT:  Head: Atraumatic.  Eyes: Conjunctivae are normal. No scleral icterus.    Neck: Neck supple. No tracheal deviation present.  Cardiovascular: Normal rate, regular rhythm, normal heart sounds and intact distal pulses.   Pulmonary/Chest: Effort normal and breath sounds normal. No accessory muscle usage. No respiratory distress.  Abdominal: Soft. He exhibits no distension. There is no tenderness.  Musculoskeletal: He exhibits no edema.  Neurological: He is alert.  Speech clear/fluent. Ambulates w steady gait.   Skin: Skin is warm and dry.  Psychiatric: He has a normal mood and affect.  Mood and affect appear normal. Does not voice any current thoughts of harm to self or others.   Nursing note and vitals reviewed.    ED Treatments / Results  Labs (all labs ordered are listed, but only abnormal results are displayed) Results for orders placed or performed during the hospital encounter of 09/06/16  Comprehensive metabolic panel  Result Value Ref Range   Sodium 138 135 - 145 mmol/L   Potassium 3.9 3.5 - 5.1 mmol/L   Chloride 104 101 - 111 mmol/L   CO2 23 22 - 32 mmol/L   Glucose, Bld 87 65 - 99 mg/dL   BUN 18 6 - 20 mg/dL   Creatinine, Ser 2.39 0.61 - 1.24 mg/dL   Calcium 9.8 8.9 - 53.2 mg/dL   Total Protein 7.3 6.5 - 8.1 g/dL   Albumin 4.2 3.5 - 5.0 g/dL   AST 59 (H) 15 - 41 U/L   ALT 47 17 - 63 U/L   Alkaline Phosphatase 57 38 - 126 U/L   Total Bilirubin 0.8 0.3 - 1.2 mg/dL   GFR calc non Af Amer >60 >60 mL/min   GFR calc Af Amer >60 >60 mL/min   Anion gap 11 5 - 15  Ethanol  Result Value Ref Range   Alcohol, Ethyl (B) <5 <5 mg/dL  Salicylate level  Result Value Ref Range   Salicylate Lvl <7.0 2.8 - 30.0 mg/dL  Acetaminophen level  Result Value Ref Range   Acetaminophen (Tylenol), Serum <10 (L) 10 - 30 ug/mL  cbc  Result Value Ref Range   WBC 6.7 4.0 - 10.5 K/uL   RBC 4.65 4.22 - 5.81 MIL/uL   Hemoglobin 15.4 13.0 - 17.0 g/dL   HCT 02.3 34.3 - 56.8 %   MCV 98.5 78.0 - 100.0 fL   MCH 33.1 26.0 - 34.0 pg   MCHC 33.6 30.0 - 36.0 g/dL   RDW  61.6 83.7 - 29.0 %   Platelets 254 150 - 400 K/uL  Rapid urine drug screen (hospital performed)  Result Value Ref Range   Opiates NONE DETECTED NONE DETECTED   Cocaine POSITIVE (A) NONE DETECTED   Benzodiazepines NONE DETECTED NONE DETECTED   Amphetamines NONE DETECTED NONE DETECTED   Tetrahydrocannabinol NONE DETECTED NONE DETECTED   Barbiturates NONE DETECTED NONE DETECTED   EKG  EKG Interpretation None       Radiology No results found.  Procedures Procedures (including critical care time)  Medications Ordered in ED Medications - No data to display   Initial Impression / Assessment and  Plan / ED Course  I have reviewed the triage vital signs and the nursing notes.  Pertinent labs & imaging results that were available during my care of the patient were reviewed by me and considered in my medical decision making (see chart for details).  Clinical Course    Labs sent from triage.  Patient w normal mood/affect. No acute psychosis.   Discussed w pt need for outpatient behavioral health and pcp f/u - discussed some options with patient, and will provide resource guide.    Final Clinical Impressions(s) / ED Diagnoses   Final diagnoses:  None    New Prescriptions New Prescriptions   No medications on file     Cathren Laine, MD 09/06/16 1956

## 2016-09-06 NOTE — ED Notes (Signed)
SBAR Report received from previous nurse. Pt received calm and visible on unit. Pt endorses current SII, depression, anxiety, and pain 10/10 at this time, and is otherwise stable. Pt reminded of camera surveillance, q 15 min rounds, and rules of the milieu. Pt screened for contraband by Clinical research associate, will continue to assess.

## 2016-09-06 NOTE — ED Triage Notes (Signed)
Pt bib PTAR d/t SI, depression and ETOH/cocaine intoxication.  Pt is A&O x 4.  Ambulatory in triage.

## 2016-09-07 DIAGNOSIS — F17210 Nicotine dependence, cigarettes, uncomplicated: Secondary | ICD-10-CM | POA: Diagnosis not present

## 2016-09-07 DIAGNOSIS — Z813 Family history of other psychoactive substance abuse and dependence: Secondary | ICD-10-CM

## 2016-09-07 DIAGNOSIS — F1420 Cocaine dependence, uncomplicated: Secondary | ICD-10-CM

## 2016-09-07 DIAGNOSIS — F1414 Cocaine abuse with cocaine-induced mood disorder: Secondary | ICD-10-CM

## 2016-09-07 DIAGNOSIS — Z79899 Other long term (current) drug therapy: Secondary | ICD-10-CM

## 2016-09-07 NOTE — Consult Note (Signed)
[facility] Face-to-Face Psychiatry Consult   Reason for Consult:  Cocaine abuse with suicidal ideations Referring Physician:  EDP Patient Identification: Frasier Springborn MRN:  [id number] Principal Diagnosis: Cocaine abuse with cocaine-induced mood disorder [facility]) Diagnosis:   Patient Active Problem List   Diagnosis Date Noted  . Cocaine abuse with cocaine-induced mood disorder (HCC) [F14.14] 09/06/2016    Priority: High  . Cocaine use disorder, severe, dependence (HCC) [F14.20] 05/31/2016    Priority: High  . Alcohol use disorder, mild, abuse [F10.10] 07/31/2016  . Essential hypertension [I10] 05/31/2016    Total Time spent with patient: 45 minutes  Subjective:   Kaveh Blowers is a 50 y.o. male patient does not warrant admission.  HPI:  Yesterday, 50 yo male who presented to the ED under the influence of cocaine, complaining of suicidal ideations and hallucinations.  He was at [facility] and transferred to [facility] but discharged on admission due to his behaviors.  Shey then came to [facility].  He is not responding to internal stimuli nor having suicidal/homicidal ideations or withdrawal symptoms.  Galvin is demanding and argumentative.  Educated on the use of cocaine and hallucinations.  Stable for discharge.   Patient returned to the ED in an hour after discharge tomorrow complaining of suicidal ideations.  He is not allowed to go back to [facility] because of his behaviors the last time he was there (3 weeks ago) and demanding a private room, special requests, etc.  He did not follow-up with his outpatient appointments.  Today, he was calm and cooperative and says he stays in the shelter and was agreeable to go see a psychiatrist and therapist outpatient.  He got on the phone and was talking to someone.  When he got off the phone, he became agitated and said we were breaking the law by not admitting him, escorted off the unit as he was not suicidal/homicidal, hallucinating, or withdrawing.  Shauna reported  he was going to immediately return.  Past Psychiatric History: substance abuse  Risk to Self:no Risk to Others: Homicidal Ideation: No Thoughts of Harm to Others: No Comment - Thoughts of Harm to Others: Denies  Current Homicidal Intent: No Current Homicidal Plan: No Describe Current Homicidal Plan: Denies  Access to Homicidal Means: No Describe Access to Homicidal Means: Denies  Identified Victim: Denies  History of harm to others?: No Assessment of Violence: None Noted Violent Behavior Description: No violent behavior observed. Pt is calm and cooperative at this time.  Does patient have access to weapons?: No Criminal Charges Pending?: No Does patient have a court date: Nonone Prior Inpatient Therapy: Prior Inpatient Therapy: Yes Prior Therapy Dates: 6/17, 9/17 Prior Therapy Facilty/Provider(s): [facility]  Reason for Treatment: Depressionmultiple Prior Outpatient Therapy: Prior Outpatient Therapy: No Prior Therapy Dates: NA Prior Therapy Facilty/Provider(s): NA Reason for Treatment: NA Does patient have an ACCT team?: No Does patient have Intensive In-House Services?  : No Does patient have [facility] services? : No Does patient have P4[facility] services?: Nonone currently   Past Medical History:  Past Medical History:  Diagnosis Date  . Asthma   . Depression   . Homelessness   . Hypertension    History reviewed. No pertinent surgical history. Family History:  Family History  Problem Relation Age of Onset  . Drug abuse Mother   . Suicidality Mother    Family Psychiatric  History: none Social History:  History  Alcohol Use  . Yes    Comment: infrequently     History  Drug Use  . Types: Cocaine    Social History   Social History  . Marital status: Single    Spouse name: N/A  . Number of children: N/A  . Years of education: N/A   Social History Main Topics  . Smoking status: Current Every Day Smoker    Types: Cigarettes  . Smokeless tobacco: Never Used  .  Alcohol use Yes     Comment: infrequently  . Drug use:     Types: Cocaine  . Sexual activity: Not Asked   Other Topics Concern  . None   Social History Narrative  . None   Additional Social History:    Allergies:   Allergies  Allergen Reactions  . Tylenol [Acetaminophen] Nausea And Vomiting    Labs:  Results for orders placed or performed during the hospital encounter of 09/06/16 (from the past 48 hour(s))  Rapid urine drug screen (hospital performed)     Status: Abnormal   Collection Time: 09/06/16 10:03 PM  Result Value Ref Range   Opiates NONE DETECTED NONE DETECTED   Cocaine POSITIVE (A) NONE DETECTED   Benzodiazepines NONE DETECTED NONE DETECTED   Amphetamines NONE DETECTED NONE DETECTED   Tetrahydrocannabinol NONE DETECTED NONE DETECTED   Barbiturates NONE DETECTED NONE DETECTED    Comment:        DRUG SCREEN FOR MEDICAL PURPOSES ONLY.  IF CONFIRMATION IS NEEDED FOR ANY PURPOSE, NOTIFY LAB WITHIN 5 DAYS.        LOWEST DETECTABLE LIMITS FOR URINE DRUG SCREEN Drug Class       Cutoff (ng/mL) Amphetamine      1000 Barbiturate      200 Benzodiazepine   200 Tricyclics       300 Opiates          300 Cocaine          300 THC              50     Current Facility-Administered Medications  Medication Dose Route Frequency Provider Last Rate Last Dose  . albuterol (PROVENTIL HFA;VENTOLIN HFA) 108 (90 Base) MCG/ACT inhaler 1-2 puff  1-2 puff Inhalation Q4H PRN Doug Sou, MD      . gabapentin (NEURONTIN) capsule 300 mg  300 mg Oral TID Doug Sou, MD      . lisinopril (PRINIVIL,ZESTRIL) tablet 40 mg  40 mg Oral Daily Doug Sou, MD      . nicotine (NICODERM CQ - dosed in mg/24 hours) patch 21 mg  21 mg Transdermal Daily Doug Sou, MD      . traZODone (DESYREL) tablet 100 mg  100 mg Oral QHS Doug Sou, MD       Current Outpatient Prescriptions  Medication Sig Dispense Refill  . albuterol (PROVENTIL HFA;VENTOLIN HFA) 108 (90 Base) MCG/ACT  inhaler Inhale 1-2 puffs into the lungs every 4 (four) hours as needed for shortness of breath.    . divalproex (DEPAKOTE ER) 500 MG 24 hr tablet Take 1 tablet (500 mg total) by mouth 2 (two) times daily. (Patient not taking: Reported on 09/06/2016) 60 tablet 0  . gabapentin (NEURONTIN) 300 MG capsule Take 1 capsule (300 mg total) by mouth 3 (three) times daily. 90 capsule 0  . hydrOXYzine (ATARAX/VISTARIL) 25 MG tablet Take 1 tablet (25 mg total) by mouth every 6 (six) hours as needed for anxiety. (Patient not taking: Reported on 09/06/2016) 30 tablet 0  . lisinopril (PRINIVIL,ZESTRIL) 40 MG tablet Take 1 tablet (40 mg total) by mouth  daily. 30 tablet 0  . predniSONE (DELTASONE) 20 MG tablet Take 2 tablets (40 mg total) by mouth daily. (Patient not taking: Reported on 09/06/2016) 8 tablet 0  . traZODone (DESYREL) 100 MG tablet Take 1 tablet (100 mg total) by mouth at bedtime. For sleep (Patient not taking: Reported on 09/06/2016) 30 tablet 0    Musculoskeletal: Strength & Muscle Tone: within normal limits Gait & Station: normal Patient leans: N/A  Psychiatric Specialty Exam: Physical Exam  Constitutional: He is oriented to person, place, and time. He appears well-developed and well-nourished.  HENT:  Head: Normocephalic.  Neck: Normal range of motion.  Respiratory: Effort normal.  Musculoskeletal: Normal range of motion.  Neurological: He is alert and oriented to person, place, and time.  Skin: Skin is warm and dry.  Psychiatric: His speech is normal and behavior is normal. Judgment and thought content normal. Cognition and memory are normal. He exhibits a depressed mood.    Review of Systems  Constitutional: Negative.   HENT: Negative.   Eyes: Negative.   Respiratory: Negative.   Cardiovascular: Negative.   Gastrointestinal: Negative.   Genitourinary: Negative.   Musculoskeletal: Negative.   Skin: Negative.   Neurological: Negative.   Endo/Heme/Allergies: Negative.    Psychiatric/Behavioral: Positive for depression and substance abuse.    Blood pressure 140/91, pulse 64, temperature 98.3 F (36.8 C), temperature source Oral, resp. rate 18, height 5\' 9"  (1.753 m), weight 81.6 kg (180 lb), SpO2 100 %.Body mass index is 26.58 kg/m.  General Appearance: Casual  Eye Contact:  Good  Speech:  Normal Rate  Volume:  Normal  Mood:  Depressed, mild  Affect:  Congruent  Thought Process:  Coherent and Descriptions of Associations: Intact  Orientation:  Full (Time, Place, and Person)  Thought Content:  WDL  Suicidal Thoughts:  No  Homicidal Thoughts:  No  Memory:  Immediate;   Good Recent;   Good Remote;   Good  Judgement:  Fair  Insight:  Fair  Psychomotor Activity:  Normal  Concentration:  Concentration: Good and Attention Span: Good  Recall:  Good  Fund of Knowledge:  Fair  Language:  Fair  Akathisia:  No  Handed:  Right  AIMS (if indicated):     Assets:  Leisure Time Physical Health Resilience  ADL's:  Intact  Cognition:  WNL  Sleep:        Treatment Plan Summary: Daily contact with patient to assess and evaluate symptoms and progress in treatment, Medication management and Plan cocaine abuse with cocaine induced mood disorder:  -Crisis stabilization -Medication management:  Medical medications continued along with Trazodone 100 mg at bedtime for sleep.  Start Gabapentin 300 mg TID for mood stabilization -Individual and substance abuse counseling -[facility] referral  Disposition: No evidence of imminent risk to self or others at present.    Nanine MeansLORD, JAMISON, NP 09/07/2016 10:15 AM  Patient seen face-to-face for psychiatric evaluation, chart reviewed and case discussed with the physician extender and developed treatment plan. Reviewed the information documented and agree with the treatment plan. Thedore MinsMojeed Jaynie Hitch, MD

## 2016-09-07 NOTE — ED Notes (Signed)
Patient noted sleeping in room. No complaints, stable, in no acute distress. Q15 minute rounds and monitoring via Security Cameras to continue.  

## 2016-09-07 NOTE — ED Notes (Signed)
Patient is beligerant and threatening regarding d/c.    "I'm not going nowhere".  Unwilling to listen to d/c instructions. Patient advocacy number requested and received. All belongings returned to pt.  WLED charge RN,[facility] security, [employer], [facility] AC notified. Escorted to lobby.

## 2016-09-07 NOTE — [facility] Suicide Risk Assessment (Signed)
Suicide Risk Assessment  Discharge Assessment   [facility] Discharge Suicide Risk Assessment   Principal Problem: Cocaine abuse with cocaine-induced mood disorder [facility]) Discharge Diagnoses:  Patient Active Problem List   Diagnosis Date Noted  . Cocaine abuse with cocaine-induced mood disorder (HCC) [F14.14] 09/06/2016    Priority: High  . Cocaine use disorder, severe, dependence (HCC) [F14.20] 05/31/2016    Priority: High  . Alcohol use disorder, mild, abuse [F10.10] 07/31/2016  . Essential hypertension [I10] 05/31/2016    Total Time spent with patient: 45 minutes  Musculoskeletal: Strength & Muscle Tone: within normal limits Gait & Station: normal Patient leans: N/A  Psychiatric Specialty Exam: Physical Exam  Constitutional: He is oriented to person, place, and time. He appears well-developed and well-nourished.  HENT:  Head: Normocephalic.  Neck: Normal range of motion.  Respiratory: Effort normal.  Musculoskeletal: Normal range of motion.  Neurological: He is alert and oriented to person, place, and time.  Skin: Skin is warm and dry.  Psychiatric: His speech is normal and behavior is normal. Judgment and thought content normal. Cognition and memory are normal. He exhibits a depressed mood.    Review of Systems  Constitutional: Negative.   HENT: Negative.   Eyes: Negative.   Respiratory: Negative.   Cardiovascular: Negative.   Gastrointestinal: Negative.   Genitourinary: Negative.   Musculoskeletal: Negative.   Skin: Negative.   Neurological: Negative.   Endo/Heme/Allergies: Negative.   Psychiatric/Behavioral: Positive for depression and substance abuse.    Blood pressure 140/91, pulse 64, temperature 98.3 F (36.8 C), temperature source Oral, resp. rate 18, height 5\' 9"  (1.753 m), weight 81.6 kg (180 lb), SpO2 100 %.Body mass index is 26.58 kg/m.  General Appearance: Casual  Eye Contact:  Good  Speech:  Normal Rate  Volume:  Normal  Mood:  Depressed, mild   Affect:  Congruent  Thought Process:  Coherent and Descriptions of Associations: Intact  Orientation:  Full (Time, Place, and Person)  Thought Content:  WDL  Suicidal Thoughts:  No  Homicidal Thoughts:  No  Memory:  Immediate;   Good Recent;   Good Remote;   Good  Judgement:  Fair  Insight:  Fair  Psychomotor Activity:  Normal  Concentration:  Concentration: Good and Attention Span: Good  Recall:  Good  Fund of Knowledge:  Fair  Language:  Fair  Akathisia:  No  Handed:  Right  AIMS (if indicated):     Assets:  Leisure Time Physical Health Resilience  ADL's:  Intact  Cognition:  WNL  Sleep:      Mental Status Per Nursing Assessment::   On Admission:   suicidal ideations  Demographic Factors:  Male  Loss Factors: NA  Historical Factors: NA  Risk Reduction Factors:   Sense of responsibility to family  Continued Clinical Symptoms:  Depression, mild  Cognitive Features That Contribute To Risk:  None    Suicide Risk:  Minimal: No identifiable suicidal ideation.  Patients presenting with no risk factors but with morbid ruminations; may be classified as minimal risk based on the severity of the depressive symptoms    Plan Of Care/Follow-up recommendations:  Activity:  as tolerated Diet:  heart healthy diet  Linda Grimmer, NP 09/07/2016, 10:23 AM

## 2016-09-19 ENCOUNTER — Encounter (HOSPITAL_COMMUNITY): Payer: Self-pay

## 2016-09-19 ENCOUNTER — Emergency Department (HOSPITAL_COMMUNITY)
Admission: EM | Admit: 2016-09-19 | Discharge: 2016-09-19 | Disposition: A | Discharge status: Home / Self Care | Payer: Medicaid Other | Attending: Emergency Medicine | Admitting: Emergency Medicine

## 2016-09-19 DIAGNOSIS — Z202 Contact with and (suspected) exposure to infections with a predominantly sexual mode of transmission: Secondary | ICD-10-CM | POA: Diagnosis not present

## 2016-09-19 DIAGNOSIS — J45909 Unspecified asthma, uncomplicated: Secondary | ICD-10-CM | POA: Insufficient documentation

## 2016-09-19 DIAGNOSIS — R198 Other specified symptoms and signs involving the digestive system and abdomen: Secondary | ICD-10-CM

## 2016-09-19 DIAGNOSIS — I10 Essential (primary) hypertension: Secondary | ICD-10-CM | POA: Insufficient documentation

## 2016-09-19 DIAGNOSIS — F17210 Nicotine dependence, cigarettes, uncomplicated: Secondary | ICD-10-CM | POA: Diagnosis not present

## 2016-09-19 LAB — URINALYSIS, ROUTINE W REFLEX MICROSCOPIC
BILIRUBIN URINE: NEGATIVE
GLUCOSE, UA: NEGATIVE mg/dL
HGB URINE DIPSTICK: NEGATIVE
Ketones, ur: 15 mg/dL — AB
Leukocytes, UA: NEGATIVE
Nitrite: NEGATIVE
PROTEIN: NEGATIVE mg/dL
SPECIFIC GRAVITY, URINE: 1.022 (ref 1.005–1.030)
pH: 6 (ref 5.0–8.0)

## 2016-09-19 MED ORDER — LIDOCAINE HCL (PF) 1 % IJ SOLN
INTRAMUSCULAR | Status: AC
  Administered 2016-09-19: 1 mL
  Filled 2016-09-19: qty 5

## 2016-09-19 MED ORDER — CEFTRIAXONE SODIUM 250 MG IJ SOLR
250.0000 mg | Freq: Once | INTRAMUSCULAR | Status: AC
  Administered 2016-09-19: 250 mg via INTRAMUSCULAR
  Filled 2016-09-19: qty 250

## 2016-09-19 MED ORDER — METRONIDAZOLE 500 MG PO TABS: 2000.0000 mg | ORAL_TABLET | Freq: Once | ORAL | 0 refills | Status: AC

## 2016-09-19 MED ORDER — DOXYCYCLINE HYCLATE 100 MG PO TABS: 100.0000 mg | ORAL_TABLET | Freq: Two times a day (BID) | ORAL | 0 refills | Status: DC

## 2016-09-19 NOTE — ED Provider Notes (Signed)
[facility]-EMERGENCY DEPT Provider Note   CSN: [id number] Arrival date & time: 09/19/16  1036     History   Chief Complaint Chief Complaint  Patient presents with  . Exposure to STD    [name] is a 50 y.o. male presenting today due to concern for trichomonas infection. Patient states that he was informed by his group friend that she was diagnosed with Trichomonas and he needed to be evaluated for this. He states that he has not had any penile discharge but endorses some penile/glans itching as well as some dysuria. He states that the symptoms seem to wax and wane where he goes through a period of 5 days experiencing these symptoms only for them to self resolve and he will go about a month before they recur. He denies any pain with erection or ejaculation. No blood in his semen. No testicular pain. No scrotal swelling or penile swelling. He denies any recent lesions or ulcerations but endorses a previous diagnosis of herpes, in which she states he has not had a outbreak in at least 3 years. He denies any surrounding adenopathy.  Patient has had a total of 3 sexual partners over the past 6 months. He states that he used protection for 2 of these partners but the one partner that he did not use protection for was the woman he is referring to is his current girlfriend who informed him of her diagnosis of trichomonas.  HPI  Past Medical History:  Diagnosis Date  . Asthma   . Depression   . Homelessness   . Hypertension     Patient Active Problem List   Diagnosis Date Noted  . Cocaine abuse with cocaine-induced mood disorder (HCC) 09/06/2016  . Alcohol use disorder, mild, abuse 07/31/2016  . Cocaine use disorder, severe, dependence (HCC) 05/31/2016  . Essential hypertension 05/31/2016    History reviewed. No pertinent surgical history.     Home Medications    Prior to Admission medications   Medication Sig Start Date End Date Taking? Authorizing Provider  albuterol  (PROVENTIL HFA;VENTOLIN HFA) 108 (90 Base) MCG/ACT inhaler Inhale 1-2 puffs into the lungs every 4 (four) hours as needed for shortness of breath.   Yes Historical Provider, MD  lisinopril (PRINIVIL,ZESTRIL) 40 MG tablet Take 1 tablet (40 mg total) by mouth daily. 08/15/16  Yes Mercedes Camprubi-Soms, PA-C  divalproex (DEPAKOTE ER) 500 MG 24 hr tablet Take 1 tablet (500 mg total) by mouth 2 (two) times daily. Patient not taking: Reported on 09/19/2016 08/06/16   Adonis Brook, NP  doxycycline (VIBRA-TABS) 100 MG tablet Take 1 tablet (100 mg total) by mouth 2 (two) times daily. 09/19/16   Kathee Delton, MD  gabapentin (NEURONTIN) 300 MG capsule Take 1 capsule (300 mg total) by mouth 3 (three) times daily. Patient not taking: Reported on 09/19/2016 08/06/16   Adonis Brook, NP  hydrOXYzine (ATARAX/VISTARIL) 25 MG tablet Take 1 tablet (25 mg total) by mouth every 6 (six) hours as needed for anxiety. Patient not taking: Reported on 09/19/2016 08/06/16   Adonis Brook, NP  metroNIDAZOLE (FLAGYL) 500 MG tablet Take 4 tablets (2,000 mg total) by mouth once. 09/19/16 09/19/16  Kathee Delton, MD  predniSONE (DELTASONE) 20 MG tablet Take 2 tablets (40 mg total) by mouth daily. Patient not taking: Reported on 09/19/2016 08/24/16   Renne Crigler, PA-C  traZODone (DESYREL) 100 MG tablet Take 1 tablet (100 mg total) by mouth at bedtime. For sleep Patient not taking: Reported on 09/19/2016 08/05/16  Adonis BrookSheila [name], NP    Family History Family History  Problem Relation Age of Onset  . Drug abuse Mother   . Suicidality Mother     Social History Social History  Substance Use Topics  . Smoking status: Current Every Day Smoker    Types: Cigarettes  . Smokeless tobacco: Never Used  . Alcohol use Yes     Comment: infrequently     Allergies   Tylenol [acetaminophen]   Review of Systems Review of Systems  Constitutional: Negative.   HENT: Negative.   Eyes: Negative.   Respiratory: Negative.     Cardiovascular: Negative.   Gastrointestinal: Negative.   Genitourinary: Positive for dysuria and urgency. Negative for decreased urine volume, difficulty urinating, discharge, enuresis, flank pain, frequency, genital sores, hematuria, penile pain, penile swelling, scrotal swelling and testicular pain.  Musculoskeletal: Negative.   Skin: Negative.   Neurological: Negative.   Hematological: Negative.   Psychiatric/Behavioral: Negative.      Physical Exam Updated Vital Signs BP 165/92 (BP Location: Right Arm)   Pulse 89   Temp 98.3 F (36.8 C) (Oral)   Resp 14   SpO2 100%   Physical Exam  Constitutional: He appears well-developed and well-nourished. No distress.  HENT:  Head: Normocephalic and atraumatic.  Mouth/Throat: Oropharynx is clear and moist.  Eyes: EOM are normal. Pupils are equal, round, and reactive to light. No scleral icterus.  Neck: Normal range of motion. Neck supple.  Cardiovascular: Normal rate, regular rhythm, normal heart sounds and intact distal pulses.   No murmur heard. Pulmonary/Chest: Effort normal and breath sounds normal. No respiratory distress.  Abdominal: Soft. Bowel sounds are normal. He exhibits no distension. There is no tenderness. Hernia confirmed negative in the right inguinal area and confirmed negative in the left inguinal area.  Genitourinary: Testes normal and penis normal. Cremasteric reflex is present. Right testis shows no swelling and no tenderness. Left testis shows no swelling and no tenderness. Circumcised. No penile erythema or penile tenderness. No discharge found.  Lymphadenopathy: No inguinal adenopathy noted on the right or left side.  Skin: He is not diaphoretic.     ED Treatments / Results  Labs (all labs ordered are listed, but only abnormal results are displayed) Labs Reviewed  URINALYSIS, ROUTINE W REFLEX MICROSCOPIC (NOT AT [facility]) - Abnormal; Notable for the following:       Result Value   Color, Urine AMBER (*)     Ketones, ur 15 (*)    All other components within normal limits  RPR  HIV ANTIBODY (ROUTINE TESTING)  GC/CHLAMYDIA PROBE AMP ([place]) NOT AT [facility]    EKG  EKG Interpretation None       Radiology No results found.  Procedures Procedures (including critical care time)  Medications Ordered in ED Medications  cefTRIAXone (ROCEPHIN) injection 250 mg (not administered)     Initial Impression / Assessment and Plan / ED Course  I have reviewed the triage vital signs and the nursing notes.  Pertinent labs & imaging results that were available during my care of the patient were reviewed by me and considered in my medical decision making (see chart for details).  Clinical Course   STD exposure: Patient presented with reports of exposure to trichomonas. Very minimal symptoms reported at this time and no physical exam findings concerning for active infection. Obtain labs including urinalysis which was relatively unremarkable. HIV antibody, RPR, and GC/chlamydia probe are all pending at the time of discharge. Will treat empirically for Trichomonas as  well as chlamydia and gonorrhea. - Metronidazole 2 g once for Trichomonas - 250 mg IM ceftriaxone once for empiric treatment of gonorrhea (provided in ED) - 100 mg doxycycline twice a day 7 days for empiric treatment of chlamydia.  Patient deemed stable for discharge home and was released from our care.   Final Clinical Impressions(s) / ED Diagnoses   Final diagnoses:  Abdominal fullness  STD exposure    New Prescriptions New Prescriptions   DOXYCYCLINE (VIBRA-TABS) 100 MG TABLET    Take 1 tablet (100 mg total) by mouth 2 (two) times daily.   METRONIDAZOLE (FLAGYL) 500 MG TABLET    Take 4 tablets (2,000 mg total) by mouth once.     Kathee Delton, MD 09/19/16 [phone]

## 2016-09-19 NOTE — ED Notes (Signed)
Pt refusing to be placed back on monitor via BP cuff and pulse ox; RN notified

## 2016-09-19 NOTE — ED Notes (Addendum)
Pt given soda to drink per Dr. Wende Mott

## 2016-09-19 NOTE — ED Provider Notes (Signed)
I saw and evaluated the patient, reviewed the resident's note and I agree with the findings and plan.   EKG Interpretation None     Patient seen here after being exposed to Trichomonas. Will have RPR as well as HIV and we'll treat for De Hollingshead, MD 09/19/16 1157

## 2016-09-19 NOTE — ED Triage Notes (Signed)
Pt states his girlfriend told him she had been treated today for trichamonas. C/o burning with urination and itching both intermittently.

## 2016-09-19 NOTE — ED Notes (Addendum)
Pt left despite being instructed that he needed to stay until 30 minutes after his injections and have vitals repeated - refused to allow this RN to go over his discharge instructions and refused to sign for his paper work. Pt did take his paper work with him.   When this RN was trying to over the pts discharge paper work the pt stated "I can read! This is how shit gets started".

## 2016-09-20 LAB — RPR: RPR: NONREACTIVE

## 2016-09-20 LAB — HIV ANTIBODY (ROUTINE TESTING W REFLEX): HIV Screen 4th Generation wRfx: NONREACTIVE

## 2016-09-22 LAB — GC/CHLAMYDIA PROBE AMP (~~LOC~~) NOT AT ARMC
CHLAMYDIA, DNA PROBE: NEGATIVE
Neisseria Gonorrhea: NEGATIVE

## 2016-10-29 ENCOUNTER — Encounter (HOSPITAL_COMMUNITY): Payer: Self-pay | Admitting: Emergency Medicine

## 2016-10-29 ENCOUNTER — Emergency Department (HOSPITAL_COMMUNITY)
Admission: EM | Admit: 2016-10-29 | Discharge: 2016-10-31 | Disposition: A | Discharge status: Other Acute Inpatient Hospital | Payer: Medicaid Other | Attending: Emergency Medicine | Admitting: Emergency Medicine

## 2016-10-29 DIAGNOSIS — Z813 Family history of other psychoactive substance abuse and dependence: Secondary | ICD-10-CM | POA: Diagnosis not present

## 2016-10-29 DIAGNOSIS — I10 Essential (primary) hypertension: Secondary | ICD-10-CM | POA: Insufficient documentation

## 2016-10-29 DIAGNOSIS — F17210 Nicotine dependence, cigarettes, uncomplicated: Secondary | ICD-10-CM | POA: Diagnosis not present

## 2016-10-29 DIAGNOSIS — F1010 Alcohol abuse, uncomplicated: Secondary | ICD-10-CM | POA: Diagnosis not present

## 2016-10-29 DIAGNOSIS — F1414 Cocaine abuse with cocaine-induced mood disorder: Secondary | ICD-10-CM | POA: Diagnosis present

## 2016-10-29 DIAGNOSIS — J45909 Unspecified asthma, uncomplicated: Secondary | ICD-10-CM | POA: Insufficient documentation

## 2016-10-29 DIAGNOSIS — Z79899 Other long term (current) drug therapy: Secondary | ICD-10-CM | POA: Insufficient documentation

## 2016-10-29 DIAGNOSIS — F1410 Cocaine abuse, uncomplicated: Secondary | ICD-10-CM | POA: Insufficient documentation

## 2016-10-29 DIAGNOSIS — F1420 Cocaine dependence, uncomplicated: Secondary | ICD-10-CM | POA: Diagnosis present

## 2016-10-29 DIAGNOSIS — R45851 Suicidal ideations: Secondary | ICD-10-CM | POA: Diagnosis present

## 2016-10-29 LAB — CBC
HEMATOCRIT: 39.1 % (ref 39.0–52.0)
Hemoglobin: 13.5 g/dL (ref 13.0–17.0)
MCH: 34 pg (ref 26.0–34.0)
MCHC: 34.5 g/dL (ref 30.0–36.0)
MCV: 98.5 fL (ref 78.0–100.0)
PLATELETS: 229 10*3/uL (ref 150–400)
RBC: 3.97 MIL/uL — AB (ref 4.22–5.81)
RDW: 14.9 % (ref 11.5–15.5)
WBC: 4.8 10*3/uL (ref 4.0–10.5)

## 2016-10-29 LAB — COMPREHENSIVE METABOLIC PANEL
ALBUMIN: 3.4 g/dL — AB (ref 3.5–5.0)
ALT: 39 U/L (ref 17–63)
AST: 39 U/L (ref 15–41)
Alkaline Phosphatase: 65 U/L (ref 38–126)
Anion gap: 8 (ref 5–15)
BUN: 23 mg/dL — AB (ref 6–20)
CHLORIDE: 107 mmol/L (ref 101–111)
CO2: 25 mmol/L (ref 22–32)
Calcium: 8.7 mg/dL — ABNORMAL LOW (ref 8.9–10.3)
Creatinine, Ser: 1.29 mg/dL — ABNORMAL HIGH (ref 0.61–1.24)
GFR calc Af Amer: >60 mL/min (ref >60)
GFR calc non Af Amer: >60 mL/min (ref >60)
GLUCOSE: 134 mg/dL — AB (ref 65–99)
POTASSIUM: 3.5 mmol/L (ref 3.5–5.1)
SODIUM: 140 mmol/L (ref 135–145)
Total Bilirubin: 0.6 mg/dL (ref 0.3–1.2)
Total Protein: 6.2 g/dL — ABNORMAL LOW (ref 6.5–8.1)

## 2016-10-29 LAB — RAPID URINE DRUG SCREEN, HOSP PERFORMED
AMPHETAMINES: NOT DETECTED
BARBITURATES: NOT DETECTED
BENZODIAZEPINES: NOT DETECTED
COCAINE: POSITIVE — AB
Opiates: NOT DETECTED
Tetrahydrocannabinol: NOT DETECTED

## 2016-10-29 LAB — SALICYLATE LEVEL: Salicylate Lvl: <7 mg/dL (ref 2.8–30.0)

## 2016-10-29 LAB — ETHANOL: Alcohol, Ethyl (B): <5 mg/dL (ref <5)

## 2016-10-29 LAB — VALPROIC ACID LEVEL

## 2016-10-29 LAB — ACETAMINOPHEN LEVEL: Acetaminophen (Tylenol), Serum: <10 ug/mL — ABNORMAL LOW (ref 10–30)

## 2016-10-29 MED ORDER — HYDROXYZINE HCL 25 MG PO TABS: 25.0000 mg | ORAL_TABLET | Freq: Four times a day (QID) | ORAL | Status: DC | PRN

## 2016-10-29 MED ORDER — LORAZEPAM 1 MG PO TABS: 1.0000 mg | ORAL_TABLET | Freq: Three times a day (TID) | ORAL | Status: DC | PRN

## 2016-10-29 MED ORDER — ALUM & MAG HYDROXIDE-SIMETH 200-200-20 MG/5ML PO SUSP: 30.0000 mL | ORAL | Status: DC | PRN

## 2016-10-29 MED ORDER — DIVALPROEX SODIUM ER 500 MG PO TB24
500.0000 mg | ORAL_TABLET | Freq: Two times a day (BID) | ORAL | Status: DC
  Filled 2016-10-29 (×3): qty 1

## 2016-10-29 MED ORDER — ONDANSETRON HCL 4 MG PO TABS: 4.0000 mg | ORAL_TABLET | Freq: Three times a day (TID) | ORAL | Status: DC | PRN

## 2016-10-29 MED ORDER — IBUPROFEN 200 MG PO TABS
600.0000 mg | ORAL_TABLET | Freq: Three times a day (TID) | ORAL | Status: DC | PRN
  Administered 2016-10-29: 600 mg via ORAL
  Filled 2016-10-29: qty 3

## 2016-10-29 MED ORDER — TRAZODONE HCL 100 MG PO TABS
100.0000 mg | ORAL_TABLET | Freq: Every day | ORAL | Status: DC
  Filled 2016-10-29 (×2): qty 1

## 2016-10-29 MED ORDER — LISINOPRIL 40 MG PO TABS
40.0000 mg | ORAL_TABLET | Freq: Every day | ORAL | Status: DC
  Administered 2016-10-29: 40 mg via ORAL
  Filled 2016-10-29 (×3): qty 1

## 2016-10-29 MED ORDER — GABAPENTIN 300 MG PO CAPS
300.0000 mg | ORAL_CAPSULE | Freq: Three times a day (TID) | ORAL | Status: DC
  Administered 2016-10-30: 300 mg via ORAL
  Filled 2016-10-29 (×4): qty 1

## 2016-10-29 NOTE — ED Provider Notes (Signed)
[facility]-EMERGENCY DEPT Provider Note   CSN: [id number] Arrival date & time: 10/29/16  1329     History   Chief Complaint Chief Complaint  Patient presents with  . Suicidal    [name] is a 50 y.o. male.  HPI Patient was brought in under involuntary commitment from [place]. Reportedly is suicidal. Patient states he is suicidal but states does not have a plan. Paperwork states he has a plan to overdose and jump off bridge. He was pleasant but somewhat uncooperative with the history. States he's been using cocaine but not today. States he doesn't know how much he used because he doesn't weigh it  He denies other substance abuse.out.   Past Medical History:  Diagnosis Date  . Asthma   . Depression   . Homelessness   . Hypertension     Patient Active Problem List   Diagnosis Date Noted  . Cocaine abuse with cocaine-induced mood disorder (HCC) 09/06/2016  . Alcohol use disorder, mild, abuse 07/31/2016  . Cocaine use disorder, severe, dependence (HCC) 05/31/2016  . Essential hypertension 05/31/2016    History reviewed. No pertinent surgical history.     Home Medications    Prior to Admission medications   Medication Sig Start Date End Date Taking? Authorizing Provider  albuterol (PROVENTIL HFA;VENTOLIN HFA) 108 (90 Base) MCG/ACT inhaler Inhale 1-2 puffs into the lungs every 4 (four) hours as needed for shortness of breath.   Yes Historical Provider, MD  lisinopril (PRINIVIL,ZESTRIL) 40 MG tablet Take 1 tablet (40 mg total) by mouth daily. 08/15/16  Yes Mercedes Camprubi-Soms, PA-C  divalproex (DEPAKOTE ER) 500 MG 24 hr tablet Take 1 tablet (500 mg total) by mouth 2 (two) times daily. Patient not taking: Reported on 10/29/2016 08/06/16   Adonis Brook, NP  doxycycline (VIBRA-TABS) 100 MG tablet Take 1 tablet (100 mg total) by mouth 2 (two) times daily. Patient not taking: Reported on 10/29/2016 09/19/16   Kathee Delton, MD  gabapentin (NEURONTIN) 300 MG capsule Take  1 capsule (300 mg total) by mouth 3 (three) times daily. Patient not taking: Reported on 10/29/2016 08/06/16   Adonis Brook, NP  hydrOXYzine (ATARAX/VISTARIL) 25 MG tablet Take 1 tablet (25 mg total) by mouth every 6 (six) hours as needed for anxiety. Patient not taking: Reported on 10/29/2016 08/06/16   Adonis Brook, NP  predniSONE (DELTASONE) 20 MG tablet Take 2 tablets (40 mg total) by mouth daily. Patient not taking: Reported on 10/29/2016 08/24/16   Renne Crigler, PA-C  traZODone (DESYREL) 100 MG tablet Take 1 tablet (100 mg total) by mouth at bedtime. For sleep Patient not taking: Reported on 10/29/2016 08/05/16   Adonis Brook, NP    Family History Family History  Problem Relation Age of Onset  . Drug abuse Mother   . Suicidality Mother     Social History Social History  Substance Use Topics  . Smoking status: Current Every Day Smoker    Types: Cigarettes  . Smokeless tobacco: Never Used  . Alcohol use Yes     Comment: infrequently     Allergies   Tylenol [acetaminophen]   Review of Systems Review of Systems  Unable to perform ROS: Psychiatric disorder  Constitutional: Negative for appetite change.  Respiratory: Negative for shortness of breath.   Psychiatric/Behavioral: Positive for suicidal ideas.     Physical Exam Updated Vital Signs BP 148/88   Pulse 78   Temp 98.4 F (36.9 C) (Oral)   Resp 18   Ht 5\' 9"  (1.753  m)   Wt 185 lb (83.9 kg)   SpO2 98%   BMI 27.32 kg/m   Physical Exam  Constitutional: He appears well-developed.  HENT:  Head: Atraumatic.  Neck: Neck supple.  Cardiovascular: Normal rate.   Abdominal: Soft.  Musculoskeletal: Normal range of motion.  Neurological: He is alert.  Skin: Skin is warm. Capillary refill takes less than 2 seconds.  Psychiatric:  Patient is overall pleasant but somewhat uncooperative with history.     ED Treatments / Results  Labs (all labs ordered are listed, but only abnormal results are  displayed) Labs Reviewed  COMPREHENSIVE METABOLIC PANEL - Abnormal; Notable for the following:       Result Value   Glucose, Bld 134 (*)    BUN 23 (*)    Creatinine, Ser 1.29 (*)    Calcium 8.7 (*)    Total Protein 6.2 (*)    Albumin 3.4 (*)    All other components within normal limits  ACETAMINOPHEN LEVEL - Abnormal; Notable for the following:    Acetaminophen (Tylenol), Serum <10 (*)    All other components within normal limits  CBC - Abnormal; Notable for the following:    RBC 3.97 (*)    All other components within normal limits  RAPID URINE DRUG SCREEN, HOSP PERFORMED - Abnormal; Notable for the following:    Cocaine POSITIVE (*)    All other components within normal limits  VALPROIC ACID LEVEL - Abnormal; Notable for the following:    Valproic Acid Lvl <10 (*)    All other components within normal limits  ETHANOL  SALICYLATE LEVEL    EKG  EKG Interpretation None       Radiology No results found.  Procedures Procedures (including critical care time)  Medications Ordered in ED Medications  ibuprofen (ADVIL,MOTRIN) tablet 600 mg (not administered)  ondansetron (ZOFRAN) tablet 4 mg (not administered)  alum & mag hydroxide-simeth (MAALOX/MYLANTA) 200-200-20 MG/5ML suspension 30 mL (not administered)  divalproex (DEPAKOTE ER) 24 hr tablet 500 mg (500 mg Oral Not Given 10/29/16 1454)  gabapentin (NEURONTIN) capsule 300 mg (not administered)  hydrOXYzine (ATARAX/VISTARIL) tablet 25 mg (not administered)  lisinopril (PRINIVIL,ZESTRIL) tablet 40 mg (40 mg Oral Given 10/29/16 1451)  traZODone (DESYREL) tablet 100 mg (not administered)     Initial Impression / Assessment and Plan / ED Course  I have reviewed the triage vital signs and the nursing notes.  Pertinent labs & imaging results that were available during my care of the patient were reviewed by me and considered in my medical decision making (see chart for details).  Clinical Course     Patient presents  with substance abuse and suicidal thoughts. [facility] Medically cleared. To be seen by TTS.  Final Clinical Impressions(s) / ED Diagnoses   Final diagnoses:  Cocaine abuse  Suicidal ideations    New Prescriptions New Prescriptions   No medications on file     Benjiman CoreNathan Demetry Bendickson, MD 10/29/16 1608

## 2016-10-29 NOTE — ED Notes (Signed)
Pt arrived to room #36 ambulatory and alert. Endorses passive SI with no plan. Denies HI/AVH. States mother committed suicide when he was 50 years old. Also admitted to being exceptionally hungry because "that's what happens when you're coming down off of crack and you haven't eaten in weeks." Pt  States that he is interested in treatment for drug abuse. Contracted for safety.

## 2016-10-29 NOTE — ED Triage Notes (Signed)
Patient is escorted from [employer].  Patient is IVC.  He endorses SI with a plan to OD and jump off bridge.
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
## 2016-10-29 NOTE — Progress Notes (Signed)
Patient noted to have been seen in the ED 13 times within the last six months.   Patient listed as having Medicaid [place] access insurance.  Also has [facility]. Patient's pcp listed on patient's insurnace card is located at the [facility] clinic in [place]. Per chart review, patient to be evaluated by psychiatrist in am.

## 2016-10-29 NOTE — ED Notes (Signed)
Pt resting at present, no distress noted, calm & cooperative at present.  Monitoring for safety, Q 15 min checks in effect. 

## 2016-10-30 DIAGNOSIS — Z813 Family history of other psychoactive substance abuse and dependence: Secondary | ICD-10-CM

## 2016-10-30 DIAGNOSIS — Z888 Allergy status to other drugs, medicaments and biological substances status: Secondary | ICD-10-CM

## 2016-10-30 DIAGNOSIS — F17210 Nicotine dependence, cigarettes, uncomplicated: Secondary | ICD-10-CM | POA: Diagnosis not present

## 2016-10-30 DIAGNOSIS — F1010 Alcohol abuse, uncomplicated: Secondary | ICD-10-CM

## 2016-10-30 DIAGNOSIS — F1414 Cocaine abuse with cocaine-induced mood disorder: Secondary | ICD-10-CM

## 2016-10-30 DIAGNOSIS — Z79899 Other long term (current) drug therapy: Secondary | ICD-10-CM

## 2016-10-30 MED ORDER — LORAZEPAM 1 MG PO TABS
1.0000 mg | ORAL_TABLET | Freq: Once | ORAL | Status: DC
  Filled 2016-10-30: qty 1

## 2016-10-30 MED ORDER — ALBUTEROL SULFATE HFA 108 (90 BASE) MCG/ACT IN AERS
1.0000 | INHALATION_SPRAY | RESPIRATORY_TRACT | Status: DC | PRN
  Filled 2016-10-30: qty 6.7

## 2016-10-30 NOTE — BH Assessment (Signed)
[facility] Assessment Progress Note  Per Thedore Mins, MD, this pt requires psychiatric hospitalization at this time.  Pt presents under IVC initiated by Sharyne Peach, MD at [facility].  At 15:15, Shanda Bumps calls from [facility] to report that pt has been accepted to their facility by Dr Cassie Freer.  Alberteen Sam, FNP concurs with this decision.  Pt's nurse, Kendal Hymen, has been notified, and agrees to call report to [phone].  Pt is to be transported via [facility].  Doylene Canning, MA Triage Specialist [phone]
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
## 2016-10-30 NOTE — Progress Notes (Addendum)
10/30/16 1408:  LRT went to pt room to introduce self and pt got excited because he recognized LRT from [facility].  Pt started telling LRT he was hungry.  Pt wanted LRT to "make something happen" to get him more food.  LRT informed pt that snack time was at 3pm and there was nothing LRT could to about that.  LRT encouraged pt to talk to nurse if he was that hungry to see if she could help him but pt didn't want to do that.  Pt eventually used playing cards to show nurse his card tricks.  Caroll Rancher, LRT/CTRS

## 2016-10-30 NOTE — ED Notes (Signed)
This patient started the day very irritable.  He has become much more calm and cooperative as the day has progressed.  He is refusing all medications.  15 minute checks and video monitoring continue.

## 2016-10-30 NOTE — Consult Note (Signed)
[place] Psychiat[facility] Consult   Reason for Consult:  Suicidal, cocaine abuse Referring Physician:  EDP Patient Identification: [name] MRN:  [id number] Principal Diagnosis: Cocaine abuse with cocaine-induced mood disorder [facility]) Diagnosis:   Patient Active Problem List   Diagnosis Date Noted  . Cocaine abuse with cocaine-induced mood disorder ([place]) [F14.14] 09/06/2016    Priority: High  . Cocaine use disorder, severe, dependence ([place]) [F14.20] 05/31/2016    Priority: High  . Alcohol use disorder, mild, abuse [F10.10] 07/31/2016  . Essential hypertension [I10] 05/31/2016    Total Time spent with patient: 45 minutes  Subjective:   [name] is a 49 y.o. male patient admitted with suicidal ideation.  HPI:  [name] is a 50 y.o. male with history of Cocaine addiction and mood disorder. He was brought  to [facility] under IVC from [place]. IVC indicates "Respondent is mentally ill. He endorses SI with plan to OD or jump off bridge. He is a danger to himself and requires inpatient psychiatric hospitalization."  Today, patient reports having difficulty getting off cocaine and he is seeking for a long term treatment . In addition, he has mood swings, gets agitated easily , poor hygiene and self care. He denies psychosis, delusions but admits to excessive cocaine use.  Past Psychiatric History: as above  Risk to Self: Suicidal Ideation: No Suicidal Intent: No Is patient at risk for suicide?: No Suicidal Plan?: No Access to Means: No What has been your use of drugs/alcohol within the last 12 months?: see above Triggers for Past Attempts: Unknown Intentional Self Injurious Behavior: None Risk to Others: Homicidal Ideation: No Thoughts of Harm to Others: No Current Homicidal Intent: No Current Homicidal Plan: No Access to Homicidal Means: No History of harm to others?: No Assessment of Violence: None Noted Does patient have access to weapons?: No Criminal Charges  Pending?: No Does patient have a court date: No Prior Inpatient Therapy: Prior Inpatient Therapy: Yes Prior Therapy Dates: multiple times Prior Therapy Facilty/Provider(s): [facility]  Reason for Treatment: SI; drug use Prior Outpatient Therapy: Prior Outpatient Therapy: No Does patient have an ACCT team?: No Does patient have Intensive In-House Services?  : No Does patient have [facility] services? : Yes Does patient have P4CC services?: No  Past Medical History:  Past Medical History:  Diagnosis Date  . Asthma   . Depression   . Homelessness   . Hypertension    History reviewed. No pertinent surgical history. Family History:  Family History  Problem Relation Age of Onset  . Drug abuse Mother   . Suicidality Mother    Family Psychiatric  History: unknown by the patient Social History:  History  Alcohol Use  . Yes    Comment: infrequently     History  Drug Use  . Types: Cocaine    Social History   Social History  . Marital status: Single    Spouse name: N/A  . Number of children: N/A  . Years of education: N/A   Social History Main Topics  . Smoking status: Current Every Day Smoker    Types: Cigarettes  . Smokeless tobacco: Never Used  . Alcohol use Yes     Comment: infrequently  . Drug use:     Types: Cocaine  . Sexual activity: Yes    Birth control/ protection: None   Other Topics Concern  . None   Social History Narrative  . None   Additional Social History:    Allergies:   Allergies  Allergen  Reactions  . Tylenol [Acetaminophen] Nausea And Vomiting    Labs:  Results for orders placed or performed during the hospital encounter of 10/29/16 (from the past 48 hour(s))  Comprehensive metabolic panel     Status: Abnormal   Collection Time: 10/29/16  2:01 PM  Result Value Ref Range   Sodium 140 135 - 145 mmol/L   Potassium 3.5 3.5 - 5.1 mmol/L   Chloride 107 101 - 111 mmol/L   CO2 25 22 - 32 mmol/L   Glucose, Bld 134 (H) 65 - 99 mg/dL   BUN 23  (H) 6 - 20 mg/dL   Creatinine, Ser 1.29 (H) 0.61 - 1.24 mg/dL   Calcium 8.7 (L) 8.9 - 10.3 mg/dL   Total Protein 6.2 (L) 6.5 - 8.1 g/dL   Albumin 3.4 (L) 3.5 - 5.0 g/dL   AST 39 15 - 41 U/L   ALT 39 17 - 63 U/L   Alkaline Phosphatase 65 38 - 126 U/L   Total Bilirubin 0.6 0.3 - 1.2 mg/dL   GFR calc non Af Amer >60 >60 mL/min   GFR calc Af Amer >60 >60 mL/min    Comment: (NOTE) The eGFR has been calculated using the CKD EPI equation. This calculation has not been validated in all clinical situations. eGFR's persistently <60 mL/min signify possible Chronic Kidney Disease.    Anion gap 8 5 - 15  Ethanol     Status: None   Collection Time: 10/29/16  2:01 PM  Result Value Ref Range   Alcohol, Ethyl (B) <5 <5 mg/dL    Comment:        LOWEST DETECTABLE LIMIT FOR SERUM ALCOHOL IS 5 mg/dL FOR MEDICAL PURPOSES ONLY   Salicylate level     Status: None   Collection Time: 10/29/16  2:01 PM  Result Value Ref Range   Salicylate Lvl <5.4 2.8 - 30.0 mg/dL  Acetaminophen level     Status: Abnormal   Collection Time: 10/29/16  2:01 PM  Result Value Ref Range   Acetaminophen (Tylenol), Serum <10 (L) 10 - 30 ug/mL    Comment:        THERAPEUTIC CONCENTRATIONS VARY SIGNIFICANTLY. A RANGE OF 10-30 ug/mL MAY BE AN EFFECTIVE CONCENTRATION FOR MANY PATIENTS. HOWEVER, SOME ARE BEST TREATED AT CONCENTRATIONS OUTSIDE THIS RANGE. ACETAMINOPHEN CONCENTRATIONS >150 ug/mL AT 4 HOURS AFTER INGESTION AND >50 ug/mL AT 12 HOURS AFTER INGESTION ARE OFTEN ASSOCIATED WITH TOXIC REACTIONS.   cbc     Status: Abnormal   Collection Time: 10/29/16  2:01 PM  Result Value Ref Range   WBC 4.8 4.0 - 10.5 K/uL   RBC 3.97 (L) 4.22 - 5.81 MIL/uL   Hemoglobin 13.5 13.0 - 17.0 g/dL   HCT 39.1 39.0 - 52.0 %   MCV 98.5 78.0 - 100.0 fL   MCH 34.0 26.0 - 34.0 pg   MCHC 34.5 30.0 - 36.0 g/dL   RDW 14.9 11.5 - 15.5 %   Platelets 229 150 - 400 K/uL  Valproic acid level     Status: Abnormal   Collection Time:  10/29/16  2:07 PM  Result Value Ref Range   Valproic Acid Lvl <10 (L) 50.0 - 100.0 ug/mL    Comment: RESULTS CONFIRMED BY MANUAL DILUTION  Rapid urine drug screen (hospital performed)     Status: Abnormal   Collection Time: 10/29/16  2:23 PM  Result Value Ref Range   Opiates NONE DETECTED NONE DETECTED   Cocaine POSITIVE (A) NONE DETECTED   Benzodiazepines  NONE DETECTED NONE DETECTED   Amphetamines NONE DETECTED NONE DETECTED   Tetrahydrocannabinol NONE DETECTED NONE DETECTED   Barbiturates NONE DETECTED NONE DETECTED    Comment:        DRUG SCREEN FOR MEDICAL PURPOSES ONLY.  IF CONFIRMATION IS NEEDED FOR ANY PURPOSE, NOTIFY LAB WITHIN 5 DAYS.        LOWEST DETECTABLE LIMITS FOR URINE DRUG SCREEN Drug Class       Cutoff (ng/mL) Amphetamine      1000 Barbiturate      200 Benzodiazepine   322 Tricyclics       025 Opiates          300 Cocaine          300 THC              50     Current Facility-Administered Medications  Medication Dose Route Frequency Provider Last Rate Last Dose  . albuterol (PROVENTIL HFA;VENTOLIN HFA) 108 (90 Base) MCG/ACT inhaler 1-2 puff  1-2 puff Inhalation Q4H PRN Shary Decamp, PA-C      . alum & mag hydroxide-simeth (MAALOX/MYLANTA) 200-200-20 MG/5ML suspension 30 mL  30 mL Oral PRN Davonna Belling, MD      . divalproex (DEPAKOTE ER) 24 hr tablet 500 mg  500 mg Oral BID Davonna Belling, MD      . gabapentin (NEURONTIN) capsule 300 mg  300 mg Oral TID Davonna Belling, MD      . hydrOXYzine (ATARAX/VISTARIL) tablet 25 mg  25 mg Oral Q6H PRN Davonna Belling, MD      . ibuprofen (ADVIL,MOTRIN) tablet 600 mg  600 mg Oral Q8H PRN Davonna Belling, MD   600 mg at 10/29/16 2115  . lisinopril (PRINIVIL,ZESTRIL) tablet 40 mg  40 mg Oral Daily Davonna Belling, MD   40 mg at 10/29/16 1451  . ondansetron (ZOFRAN) tablet 4 mg  4 mg Oral Q8H PRN Davonna Belling, MD      . traZODone (DESYREL) tablet 100 mg  100 mg Oral QHS Davonna Belling, MD       Current  Outpatient Prescriptions  Medication Sig Dispense Refill  . albuterol (PROVENTIL HFA;VENTOLIN HFA) 108 (90 Base) MCG/ACT inhaler Inhale 1-2 puffs into the lungs every 4 (four) hours as needed for shortness of breath.    . lisinopril (PRINIVIL,ZESTRIL) 40 MG tablet Take 1 tablet (40 mg total) by mouth daily. 30 tablet 0  . divalproex (DEPAKOTE ER) 500 MG 24 hr tablet Take 1 tablet (500 mg total) by mouth 2 (two) times daily. (Patient not taking: Reported on 10/29/2016) 60 tablet 0  . doxycycline (VIBRA-TABS) 100 MG tablet Take 1 tablet (100 mg total) by mouth 2 (two) times daily. (Patient not taking: Reported on 10/29/2016) 14 tablet 0  . gabapentin (NEURONTIN) 300 MG capsule Take 1 capsule (300 mg total) by mouth 3 (three) times daily. (Patient not taking: Reported on 10/29/2016) 90 capsule 0  . hydrOXYzine (ATARAX/VISTARIL) 25 MG tablet Take 1 tablet (25 mg total) by mouth every 6 (six) hours as needed for anxiety. (Patient not taking: Reported on 10/29/2016) 30 tablet 0  . predniSONE (DELTASONE) 20 MG tablet Take 2 tablets (40 mg total) by mouth daily. (Patient not taking: Reported on 10/29/2016) 8 tablet 0  . traZODone (DESYREL) 100 MG tablet Take 1 tablet (100 mg total) by mouth at bedtime. For sleep (Patient not taking: Reported on 10/29/2016) 30 tablet 0    Musculoskeletal: Strength & Muscle Tone: within normal limits Gait & Station: normal  Patient leans: N/A  Psychiatric Specialty Exam: Physical Exam  Psychiatric: His speech is normal. His affect is angry and labile. He is agitated and aggressive. Cognition and memory are normal. He expresses impulsivity. He expresses suicidal ideation.    Review of Systems  Constitutional: Negative.   HENT: Negative.   Eyes: Negative.   Respiratory: Negative.   Cardiovascular: Negative.   Gastrointestinal: Negative.   Genitourinary: Negative.   Musculoskeletal: Negative.   Skin: Negative.   Neurological: Negative.   Endo/Heme/Allergies:  Negative.   Psychiatric/Behavioral: Positive for substance abuse and suicidal ideas.    Blood pressure 156/91, pulse 72, temperature 98.1 F (36.7 C), temperature source Oral, resp. rate 16, height _0  (1.753 m), weight 83.9 kg (185 lb), SpO2 97 %.Body mass index is 27.32 kg/m.  General Appearance: Disheveled  Eye Contact:  Good  Speech:  Pressured  Volume:  Increased  Mood:  Angry and Irritable  Affect:  Labile  Thought Process:  Coherent  Orientation:  Full (Time, Place, and Person)  Thought Content:  Logical  Suicidal Thoughts:  No  Homicidal Thoughts:  No  Memory:  Immediate;   Good Recent;   Good Remote;   Good  Judgement:  Poor  Insight:  Lacking  Psychomotor Activity:  Increased  Concentration:  Concentration: Fair and Attention Span: Fair  Recall:  Good  Fund of Knowledge:  Fair  Language:  Good  Akathisia:  No  Handed:  Right  AIMS (if indicated):     Assets:  Physical Health  ADL's:  Intact  Cognition:  WNL  Sleep:   poor     Treatment Plan Summary: Daily contact with patient to assess and evaluate symptoms and progress in treatment and Medication management  Continue Depakote ER 500 mg bid for mood lability Continue Trazodone 100 mg qhs for sleep  Disposition: Recommend psychiatric Inpatient admission when medically cleared.  Corena Pilgrim, MD 10/30/2016 11:13 AM

## 2016-10-31 ENCOUNTER — Emergency Department (HOSPITAL_COMMUNITY): Payer: Medicaid Other

## 2016-10-31 DIAGNOSIS — F333 Major depressive disorder, recurrent, severe with psychotic symptoms: Secondary | ICD-10-CM | POA: Insufficient documentation
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
## 2016-10-31 NOTE — ED Notes (Signed)
Pt discharged ambulatory with [facility].  Pt was calm and cooperative at discharge.  All belongings were sent with patient.

## 2016-10-31 NOTE — ED Notes (Signed)
Pt at the time of assessment endorses anxiety and command AH; "I can still hear it in my head; I am not about to do nothing to myself here." Pt was observed pacing and restless. Pt continually ask for food every 10 mins and gets verbally aggressive when denied food. Pt refused all 2200 medication except for his Neurontin.
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
## 2016-11-27 ENCOUNTER — Encounter (HOSPITAL_COMMUNITY): Payer: Self-pay | Admitting: Emergency Medicine

## 2016-11-27 ENCOUNTER — Emergency Department (HOSPITAL_COMMUNITY)
Admission: EM | Admit: 2016-11-27 | Discharge: 2016-11-28 | Disposition: A | Discharge status: Home / Self Care | Payer: Medicaid Other | Attending: Emergency Medicine | Admitting: Emergency Medicine

## 2016-11-27 DIAGNOSIS — Z79899 Other long term (current) drug therapy: Secondary | ICD-10-CM | POA: Insufficient documentation

## 2016-11-27 DIAGNOSIS — J45909 Unspecified asthma, uncomplicated: Secondary | ICD-10-CM | POA: Insufficient documentation

## 2016-11-27 DIAGNOSIS — F1420 Cocaine dependence, uncomplicated: Secondary | ICD-10-CM | POA: Diagnosis present

## 2016-11-27 DIAGNOSIS — Z813 Family history of other psychoactive substance abuse and dependence: Secondary | ICD-10-CM | POA: Diagnosis not present

## 2016-11-27 DIAGNOSIS — R45851 Suicidal ideations: Secondary | ICD-10-CM | POA: Diagnosis present

## 2016-11-27 DIAGNOSIS — I10 Essential (primary) hypertension: Secondary | ICD-10-CM | POA: Insufficient documentation

## 2016-11-27 DIAGNOSIS — F17210 Nicotine dependence, cigarettes, uncomplicated: Secondary | ICD-10-CM | POA: Diagnosis not present

## 2016-11-27 DIAGNOSIS — Z888 Allergy status to other drugs, medicaments and biological substances status: Secondary | ICD-10-CM | POA: Diagnosis not present

## 2016-11-27 DIAGNOSIS — F1414 Cocaine abuse with cocaine-induced mood disorder: Secondary | ICD-10-CM | POA: Insufficient documentation

## 2016-11-27 HISTORY — DX: Bipolar disorder, unspecified: F31.9

## 2016-11-27 HISTORY — DX: Schizoaffective disorder, unspecified: F25.9

## 2016-11-27 LAB — COMPREHENSIVE METABOLIC PANEL
ALT: 52 U/L (ref 17–63)
ANION GAP: 12 (ref 5–15)
AST: 50 U/L — ABNORMAL HIGH (ref 15–41)
Albumin: 4.4 g/dL (ref 3.5–5.0)
Alkaline Phosphatase: 72 U/L (ref 38–126)
BILIRUBIN TOTAL: 0.8 mg/dL (ref 0.3–1.2)
BUN: 22 mg/dL — ABNORMAL HIGH (ref 6–20)
CO2: 23 mmol/L (ref 22–32)
Calcium: 9.2 mg/dL (ref 8.9–10.3)
Chloride: 103 mmol/L (ref 101–111)
Creatinine, Ser: 1.15 mg/dL (ref 0.61–1.24)
GFR calc Af Amer: >60 mL/min (ref >60)
GFR calc non Af Amer: >60 mL/min (ref >60)
GLUCOSE: 73 mg/dL (ref 65–99)
POTASSIUM: 4.4 mmol/L (ref 3.5–5.1)
SODIUM: 138 mmol/L (ref 135–145)
TOTAL PROTEIN: 7.7 g/dL (ref 6.5–8.1)

## 2016-11-27 LAB — ETHANOL: Alcohol, Ethyl (B): <5 mg/dL (ref <5)

## 2016-11-27 LAB — CBC
HEMATOCRIT: 46 % (ref 39.0–52.0)
Hemoglobin: 15.7 g/dL (ref 13.0–17.0)
MCH: 33.1 pg (ref 26.0–34.0)
MCHC: 34.1 g/dL (ref 30.0–36.0)
MCV: 96.8 fL (ref 78.0–100.0)
PLATELETS: 265 10*3/uL (ref 150–400)
RBC: 4.75 MIL/uL (ref 4.22–5.81)
RDW: 14.6 % (ref 11.5–15.5)
WBC: 8.5 10*3/uL (ref 4.0–10.5)

## 2016-11-27 LAB — RAPID URINE DRUG SCREEN, HOSP PERFORMED
Amphetamines: NOT DETECTED
BARBITURATES: NOT DETECTED
BENZODIAZEPINES: NOT DETECTED
COCAINE: POSITIVE — AB
Opiates: NOT DETECTED
Tetrahydrocannabinol: NOT DETECTED

## 2016-11-27 LAB — ACETAMINOPHEN LEVEL

## 2016-11-27 LAB — SALICYLATE LEVEL: Salicylate Lvl: <7 mg/dL (ref 2.8–30.0)

## 2016-11-27 MED ORDER — VITAMIN B-1 100 MG PO TABS: 100.0000 mg | ORAL_TABLET | Freq: Every day | ORAL | Status: DC

## 2016-11-27 MED ORDER — GABAPENTIN 300 MG PO CAPS: 300.0000 mg | ORAL_CAPSULE | Freq: Three times a day (TID) | ORAL | Status: DC

## 2016-11-27 MED ORDER — ZOLPIDEM TARTRATE 5 MG PO TABS
5.0000 mg | ORAL_TABLET | Freq: Every evening | ORAL | Status: DC | PRN
  Filled 2016-11-27: qty 1

## 2016-11-27 MED ORDER — HYDROXYZINE HCL 25 MG PO TABS: 25.0000 mg | ORAL_TABLET | Freq: Four times a day (QID) | ORAL | Status: DC | PRN

## 2016-11-27 MED ORDER — LORAZEPAM 1 MG PO TABS: 0.0000 mg | ORAL_TABLET | Freq: Two times a day (BID) | ORAL | Status: DC

## 2016-11-27 MED ORDER — ONDANSETRON HCL 4 MG PO TABS: 4.0000 mg | ORAL_TABLET | Freq: Three times a day (TID) | ORAL | Status: DC | PRN

## 2016-11-27 MED ORDER — DIVALPROEX SODIUM ER 500 MG PO TB24
500.0000 mg | ORAL_TABLET | Freq: Two times a day (BID) | ORAL | Status: DC
  Filled 2016-11-27: qty 1

## 2016-11-27 MED ORDER — ALUM & MAG HYDROXIDE-SIMETH 200-200-20 MG/5ML PO SUSP: 30.0000 mL | ORAL | Status: DC | PRN

## 2016-11-27 MED ORDER — LORAZEPAM 1 MG PO TABS: 0.0000 mg | ORAL_TABLET | Freq: Four times a day (QID) | ORAL | Status: DC

## 2016-11-27 MED ORDER — TRAZODONE HCL 100 MG PO TABS: 100.0000 mg | ORAL_TABLET | Freq: Every day | ORAL | Status: DC

## 2016-11-27 MED ORDER — LISINOPRIL 40 MG PO TABS
40.0000 mg | ORAL_TABLET | Freq: Every day | ORAL | Status: DC
  Filled 2016-11-27: qty 1

## 2016-11-27 MED ORDER — IBUPROFEN 200 MG PO TABS: 600.0000 mg | ORAL_TABLET | Freq: Three times a day (TID) | ORAL | Status: DC | PRN

## 2016-11-27 MED ORDER — THIAMINE HCL 100 MG/ML IJ SOLN: 100.0000 mg | Freq: Every day | INTRAMUSCULAR | Status: DC

## 2016-11-27 NOTE — ED Notes (Signed)
It is noted that the Pt gets agitated very quickly.  Pt upset about not getting to eat prior to labs and stating his Primary RN "cleaned too long" prior to attempting to draw labs.  Per request, Pt provided [place] sandwich, Coke, graham crackers, and milk.

## 2016-11-27 NOTE — ED Notes (Signed)
Patient presents ambulatory from [facility]CU.  On approach he is irritable and does not want to answer questions.  He tells me to go read the chart.  He made frequent requests for food until a lunch tray came.  He ate that and has been sleeping since that time.

## 2016-11-27 NOTE — BH Assessment (Signed)
Assessment Note  [name] FrohlichCorey [name] is an 50 y.o. male. Patient has suicidal thoughts. He has a plan to cut wrist. Sts he had tried to harm himself so much so that he lost count. No HI. Pt reports auditory hallucinations. Pt states he has had auditory hallucinations all of his life. Pt reports daily cocaine and alcohol use. Pt had multiple hospitalizations for SI and SA. Pt states he does not like outpatient treatment because it does not work. Pt reports the following stressors: job loss, financial concerns, and conflict with DSS. The Pt's children are in DSS custody. Patient [facility] Sts that he refused treatment because, "They were trying to put me in a Christian based program" and "I know Jesus I don't need no program like that". Patient refused to cooperate with recommendations at [facility] and was discharged early. Patient stating, "I don't want to be here either yall can just let me go". Patient is under [facility].   Diagnosis: Schizoaffective Disorder & Major Depressive Disorder Recurrent, Severe, without psychotic features  Past Medical History:  Past Medical History:  Diagnosis Date  . Asthma   . Bipolar 1 disorder (HCC)   . Depression   . Homelessness   . Hypertension   . Schizoaffective disorder (HCC)     History reviewed. No pertinent surgical history.  Family History:  Family History  Problem Relation Age of Onset  . Drug abuse Mother   . Suicidality Mother     Social History:  reports that he has been smoking Cigarettes.  He has never used smokeless tobacco. He reports that he drinks alcohol. He reports that he uses drugs, including Cocaine.  Additional Social History:  Substance #1 Name of Substance 1: Cocaine 1 - Age of First Use: pt reports using "damn near all my life" 1 - Amount (size/oz): Varies 1 - Frequency: "whenever I can" 1 - Duration: ongoing 1 - Last Use / Amount: one or two days ago Substance #2 Name of Substance 2: Alcohol   2 - Age of First Use: 18 2 - Amount (size/oz): pt reports "sometimes a little, sometimes a lot." 2 - Frequency: occassionally 2 - Duration: years 2 - Last Use / Amount: unknown  CIWA: CIWA-Ar BP: 157/100 Pulse Rate: 76 COWS:    Allergies:  Allergies  Allergen Reactions  . Tylenol [Acetaminophen] Nausea And Vomiting    Home Medications:  (Not in a hospital admission)  OB/GYN Status:  No LMP for male patient.  General Assessment Data Location of Assessment: [facility] TTS Assessment: In system Is this a Tele or Face-to-Face Assessment?: Face-to-Face Is this an Initial Assessment or a Re-assessment for this encounter?: Initial Assessment Marital status: Single Maiden name:  (n/a) Is patient pregnant?: No Pregnancy Status: No Living Arrangements: Other (Comment) (homeless) Can pt return to current living arrangement?: Yes Admission Status: Involuntary Is patient capable of signing voluntary admission?: Yes Referral Source: Self/Family/Friend Insurance type:  (Medicaid)     Crisis Care Plan Living Arrangements: Other (Comment) (homeless) Legal Guardian: Other: (no legal guardian ) Name of Psychiatrist: NA Name of Therapist: NA  Education Status Is patient currently in school?: No Current Grade:  (unk) Highest grade of school patient has completed: unk Name of school:  (unk) Contact person:  (unk)  Risk to self with the past 6 months Suicidal Ideation: Yes-Currently Present Has patient been a risk to self within the past 6 months prior to admission? : Yes Suicidal Intent: Yes-Currently Present Has patient  had any suicidal intent within the past 6 months prior to admission? : Yes Is patient at risk for suicide?: Yes Suicidal Plan?: Yes-Currently Present Has patient had any suicidal plan within the past 6 months prior to admission? : Yes Specify Current Suicidal Plan:  ("slit my wrist") Access to Means: Yes Specify Access to Suicidal Means:  (sharp objects) What  has been your use of drugs/alcohol within the last 12 months?:  (alcohol and cocaine ) How many times?:  ("So many I can't count") Other Self Harm Risks:  (denies ) Triggers for Past Attempts: Unknown Intentional Self Injurious Behavior: None Family Suicide History: Unknown Recent stressful life event(s): Other (Comment) (custody hearing coming up ) Persecutory voices/beliefs?: No Depression: Yes Depression Symptoms: Feeling angry/irritable, Loss of interest in usual pleasures, Fatigue, Isolating Substance abuse history and/or treatment for substance abuse?: Yes Suicide prevention information given to non-admitted patients: Not applicable  Risk to Others within the past 6 months Homicidal Ideation: No Does patient have any lifetime risk of violence toward others beyond the six months prior to admission? : No Thoughts of Harm to Others: No Current Homicidal Intent: No Current Homicidal Plan: No Access to Homicidal Means: No History of harm to others?: No Assessment of Violence: None Noted Does patient have access to weapons?: No Criminal Charges Pending?: No Does patient have a court date: No Is patient on probation?: No  Psychosis Hallucinations: Auditory Delusions: None noted  Mental Status Report Appearance/Hygiene: Unremarkable Eye Contact: Good Motor Activity: Freedom of movement Speech: Logical/coherent Level of Consciousness: Alert Mood: Pleasant, Euthymic Affect: Appropriate to circumstance Anxiety Level: None Thought Processes: Coherent Judgement: Unimpaired Orientation: Person, Place, Time, Situation Obsessive Compulsive Thoughts/Behaviors: None  Cognitive Functioning Concentration: Normal Memory: Recent Intact, Remote Intact IQ: Average Insight: Good Impulse Control: Good Appetite: Good Weight Loss:  (none reported) Weight Gain:  (none reported) Sleep: No Change Vegetative Symptoms: None  ADLScreening [facility] Assessment Services) Patient's cognitive  ability adequate to safely complete daily activities?: Yes Patient able to express need for assistance with ADLs?: Yes Independently performs ADLs?: Yes (appropriate for developmental age)  Prior Inpatient Therapy Prior Inpatient Therapy: Yes Prior Therapy Dates: multiple times Prior Therapy Facilty/Provider(s): [facility]  Reason for Treatment: SI; drug use  Prior Outpatient Therapy Prior Outpatient Therapy: No Prior Therapy Dates:  (n/a) Does patient have an ACCT team?: No Does patient have Intensive In-House Services?  : No Does patient have [facility] services? : Yes Does patient have P4CC services?: No  ADL Screening (condition at time of admission) Patient's cognitive ability adequate to safely complete daily activities?: Yes Patient able to express need for assistance with ADLs?: Yes Independently performs ADLs?: Yes (appropriate for developmental age)             [employer]chant [employer] officer (For Healthcare) Does Patient Have a Medical Advance Directive?: No Would patient like information on creating a medical advance directive?: No - Patient declined    Additional Information 1:1 In Past 12 Months?: No CIRT Risk: No Elopement Risk: No Does patient have medical clearance?: Yes     Disposition:  Disposition Disposition of Patient: Other dispositions (Pt to be re-evaluated by psychiatry in the morning.)  On Site Evaluation by:   Reviewed with Physician:    Melynda Ripple 11/27/2016 5:58 PM

## 2016-11-27 NOTE — Progress Notes (Signed)
Pt with [facility] ED visits x 14 and admissions x 2 No [facility] availability nor ED CP sent email to [facility]  Pt with majority of visits for substance abuse in last 6 months dx hx of schizoaffective and bipolar Patient reports he is SI with plan to slit his wrist

## 2016-11-27 NOTE — ED Triage Notes (Signed)
Patient brought in by [facility]. IVC paperwork filled out. Patient has hx of schizoaffective and bipolar. Patient reports he is SI with plan to slit his wrist. Patient reports drinking alcohol and using cocaine this morning.

## 2016-11-27 NOTE — ED Notes (Signed)
Bed: [facility] Expected date:  Expected time:  Means of arrival:  Comments: 72

## 2016-11-27 NOTE — ED Notes (Signed)
EDP assessing pt in consult room

## 2016-11-27 NOTE — ED Notes (Signed)
Bed: [facility] Expected date:  Expected time:  Means of arrival:  Comments: 

## 2016-11-28 DIAGNOSIS — Z813 Family history of other psychoactive substance abuse and dependence: Secondary | ICD-10-CM | POA: Diagnosis not present

## 2016-11-28 DIAGNOSIS — F1414 Cocaine abuse with cocaine-induced mood disorder: Secondary | ICD-10-CM | POA: Diagnosis not present

## 2016-11-28 DIAGNOSIS — Z888 Allergy status to other drugs, medicaments and biological substances status: Secondary | ICD-10-CM | POA: Diagnosis not present

## 2016-11-28 DIAGNOSIS — F17210 Nicotine dependence, cigarettes, uncomplicated: Secondary | ICD-10-CM | POA: Diagnosis not present

## 2016-11-28 DIAGNOSIS — Z79899 Other long term (current) drug therapy: Secondary | ICD-10-CM

## 2016-11-28 MED ORDER — PREGABALIN 50 MG PO CAPS: 50.0000 mg | ORAL_CAPSULE | Freq: Two times a day (BID) | ORAL | Status: DC

## 2016-11-28 NOTE — BHH Suicide Risk Assessment (Signed)
Suicide Risk Assessment  Discharge Assessment   [facility] Discharge Suicide Risk Assessment   Principal Problem: Cocaine abuse with cocaine-induced mood disorder [facility]) Discharge Diagnoses:  Patient Active Problem List   Diagnosis Date Noted  . Cocaine abuse with cocaine-induced mood disorder (HCC) [F14.14] 09/06/2016    Priority: High  . Cocaine use disorder, severe, dependence (HCC) [F14.20] 05/31/2016    Priority: High  . Alcohol use disorder, mild, abuse [F10.10] 07/31/2016  . Essential hypertension [I10] 05/31/2016    Total Time spent with patient: 45 minutes  Musculoskeletal: Strength & Muscle Tone: within normal limits Gait & Station: normal Patient leans: N/A  Psychiatric Specialty Exam: Physical Exam  Constitutional: He is oriented to person, place, and time. He appears well-developed and well-nourished.  HENT:  Head: Normocephalic.  Neck: Normal range of motion.  Respiratory: Effort normal.  Musculoskeletal: Normal range of motion.  Neurological: He is alert and oriented to person, place, and time.  Psychiatric: He has a normal mood and affect. His speech is normal and behavior is normal. Judgment and thought content normal. Cognition and memory are normal.    Review of Systems  Constitutional: Negative.   HENT: Negative.   Eyes: Negative.   Respiratory: Negative.   Cardiovascular: Negative.   Gastrointestinal: Negative.   Genitourinary: Negative.   Musculoskeletal: Negative.   Skin: Negative.   Neurological: Negative.   Endo/Heme/Allergies: Negative.   Psychiatric/Behavioral: Positive for substance abuse.    Blood pressure 137/94, pulse 72, temperature 98.6 F (37 C), temperature source Oral, resp. rate 18, height 5\' 9"  (1.753 m), weight 83.9 kg (185 lb), SpO2 100 %.Body mass index is 27.32 kg/m.  General Appearance:  Casual  Eye Contact:  Good  Speech:  Normal  Volume:  Normal  Mood: Euthymic  Affect:  Euthymic  Thought Process:  Coherent  Orientation:   Full (Time, Place, and Person)  Thought Content:  Logical  Suicidal Thoughts:  No  Homicidal Thoughts:  No  Memory:  Immediate;   Good Recent;   Good Remote;   Good  Judgement:  Fair  Insight:  Fair  Psychomotor Activity:  Normal  Concentration:  Concentration: Fair and Attention Span: Fair  Recall:  Good  Fund of Knowledge:  Fair  Language:  Good  Akathisia:  No  Handed:  Right  AIMS (if indicated):     Assets:  Physical Health, resilience   ADL's:  Intact  Cognition:  WNL  Sleep:   good    Mental Status Per Nursing Assessment::   On Admission:   cocaine abuse with suicidal ideations  Demographic Factors:  Male  Loss Factors: NA  Historical Factors: NA  Risk Reduction Factors:   Sense of responsibility to family  Continued Clinical Symptoms:  None  Cognitive Features That Contribute To Risk:  None    Suicide Risk:  Minimal: No identifiable suicidal ideation.  Patients presenting with no risk factors but with morbid ruminations; may be classified as minimal risk based on the severity of the depressive symptoms    Plan Of Care/Follow-up recommendations:  Activity:  as tolerated  Diet:  heart healthy diet  Artice Holohan, NP 11/28/2016, 9:45 AM

## 2016-11-28 NOTE — BH Assessment (Signed)
BHH Assessment Progress Note  Per Thedore Mins, MD, this pt does not require psychiatric hospitalization at this time.  Pt presents under IVC initiated by [facility], which Dr Jannifer Franklin has rescinded.  Pt is to be discharged from [facility] with area substance abuse treatment referrals.  Discharge instructions advise pt to follow up with [facility], [facility], [facility], or [facility].  Pt's nurse, Kendal Hymen, has been notified.  Doylene Canning, MA Triage Specialist [phone]

## 2016-11-28 NOTE — BHH Suicide Risk Assessment (Signed)
Suicide Risk Assessment  Discharge Assessment   [facility] Discharge Suicide Risk Assessment   Principal Problem: Cocaine abuse with cocaine-induced mood disorder [facility]) Discharge Diagnoses:  Patient Active Problem List   Diagnosis Date Noted  . Cocaine abuse with cocaine-induced mood disorder (HCC) [F14.14] 09/06/2016    Priority: High  . Cocaine use disorder, severe, dependence (HCC) [F14.20] 05/31/2016    Priority: High  . Alcohol use disorder, mild, abuse [F10.10] 07/31/2016  . Essential hypertension [I10] 05/31/2016    Total Time spent with patient: 45 minutes   Musculoskeletal: Strength & Muscle Tone: within normal limits Gait & Station: normal Patient leans: N/A  Psychiatric Specialty Exam: Physical Exam  Constitutional: He is oriented to person, place, and time. He appears well-developed and well-nourished.  HENT:  Head: Normocephalic.  Neck: Normal range of motion.  Respiratory: Effort normal.  Musculoskeletal: Normal range of motion.  Neurological: He is alert and oriented to person, place, and time.  Psychiatric: He has a normal mood and affect. His speech is normal and behavior is normal. Judgment and thought content normal. Cognition and memory are normal.    Review of Systems  Constitutional: Negative.   HENT: Negative.   Eyes: Negative.   Respiratory: Negative.   Cardiovascular: Negative.   Gastrointestinal: Negative.   Genitourinary: Negative.   Musculoskeletal: Negative.   Skin: Negative.   Neurological: Negative.   Endo/Heme/Allergies: Negative.   Psychiatric/Behavioral: Positive for substance abuse.    Blood pressure 137/94, pulse 72, temperature 98.6 F (37 C), temperature source Oral, resp. rate 18, height 5\' 9"  (1.753 m), weight 83.9 kg (185 lb), SpO2 100 %.Body mass index is 27.32 kg/m.  General Appearance:  Casual  Eye Contact:  Good  Speech:  Normal  Volume:  Normal  Mood:  Irritable  Affect:  Euthymic  Thought Process:  Coherent   Orientation:  Full (Time, Place, and Person)  Thought Content:  Logical  Suicidal Thoughts:  No  Homicidal Thoughts:  No  Memory:  Immediate;   Good Recent;   Good Remote;   Good  Judgement:  Fair  Insight:  Fair  Psychomotor Activity:  Normal  Concentration:  Concentration: Fair and Attention Span: Fair  Recall:  Good  Fund of Knowledge:  Fair  Language:  Good  Akathisia:  No  Handed:  Right  AIMS (if indicated):     Assets:  Physical Health, resilience   ADL's:  Intact  Cognition:  WNL  Sleep:   good    Mental Status Per Nursing Assessment::   On Admission:   cocaine abuse with suicidal ideations  Demographic Factors:  Male  Loss Factors: NA  Historical Factors: NA  Risk Reduction Factors:   Sense of responsibility to family  Continued Clinical Symptoms:  Irritability  Cognitive Features That Contribute To Risk:  None    Suicide Risk:  Minimal: No identifiable suicidal ideation.  Patients presenting with no risk factors but with morbid ruminations; may be classified as minimal risk based on the severity of the depressive symptoms    Plan Of Care/Follow-up recommendations:  Activity:  as tolerated  Diet:  heart healthy diet  LORD, JAMISON, NP 11/28/2016, 7:08 AM

## 2016-11-28 NOTE — Discharge Instructions (Signed)
To help you maintain a sober lifestyle, a substance abuse treatment program may be beneficial to you.  Contact one of the following facilities at your earliest opportunity to ask about enrolling: ° °RESIDENTIAL PROGRAMS: ° °     [facility] °     [address] °     [place], [place] [address] °     [phone] ° °     [facility] °     [address] °     [place], [place] [address] °     [phone] ° °     [facility] °     [address] °     [place], [place] [address] °     [phone] ° °OUTPATIENT PROGRAMS: ° °     [facility] °     [address] °     [place], [place] [address] °     [phone] °     New patients are seen at the walk-in clinic every Tuesday from 9:00 am - 12:00 pm. °

## 2016-11-28 NOTE — Consult Note (Signed)
[facility] Consult   Reason for Consult:  Suicidal, cocaine abuse Referring Physician:  EDP Patient Identification: [name] MRN:  [id number] Principal Diagnosis: Cocaine abuse with cocaine-induced mood disorder [employer]) Diagnosis:   Patient Active Problem List   Diagnosis Date Noted  . Cocaine abuse with cocaine-induced mood disorder ([place]) [F14.14] 09/06/2016    Priority: High  . Cocaine use disorder, severe, dependence ([place]) [F14.20] 05/31/2016    Priority: High  . Alcohol use disorder, mild, abuse [F10.10] 07/31/2016  . Essential hypertension [I10] 05/31/2016    Total Time spent with patient: 45 minutes  Subjective:   [name] is a 50 y.o. male patient does not warrant admission.  HPI:  [name] is a 50 y.o. male with history of Cocaine addiction and mood disorder. He was brought  to [facility] under IVC from [place] for suicidal ideations.  Today, patient reports having difficulty getting off cocaine and he is seeking long term treatment, resources provided. No suicidal/homicidal ideations, hallucinations, or withdrawal symptoms.  Past Psychiatric History:substance abuse, cocaine abuse  Risk to Self: Suicidal Ideation: No Suicidal Intent: No Is patient at risk for suicide?: No Suicidal Plan?: No Access to Means: No What has been your use of drugs/alcohol within the last 12 months?: see above Triggers for Past Attempts: Unknown Intentional Self Injurious Behavior: None Risk to Others: Homicidal Ideation: No Thoughts of Harm to Others: No Current Homicidal Intent: No Current Homicidal Plan: No Access to Homicidal Means: No History of harm to others?: No Assessment of Violence: None Noted Does patient have access to weapons?: No Criminal Charges Pending?: No Does patient have a court date: No Prior Inpatient Therapy: Prior Inpatient Therapy: Yes Prior Therapy Dates: multiple times Prior Therapy Facilty/Provider(s): [facility]  Reason for  Treatment: SI; drug use Prior Outpatient Therapy: Prior Outpatient Therapy: No Prior Therapy Dates:  (n/a) Does patient have an ACCT team?: No Does patient have Intensive In-House Services?  : No Does patient have [facility] services? : Yes Does patient have P4CC services?: No  Past Medical History:  Past Medical History:  Diagnosis Date  . Asthma   . Bipolar 1 disorder ([place])   . Depression   . Homelessness   . Hypertension   . Schizoaffective disorder ([place])    History reviewed. No pertinent surgical history. Family History:  Family History  Problem Relation Age of Onset  . Drug abuse Mother   . Suicidality Mother    Family Psychiatric  History: unknown by the patient Social History:  History  Alcohol Use  . Yes    Comment: infrequently     History  Drug Use  . Types: Cocaine    Social History   Social History  . Marital status: Single    Spouse name: N/A  . Number of children: N/A  . Years of education: N/A   Social History Main Topics  . Smoking status: Current Every Day Smoker    Types: Cigarettes  . Smokeless tobacco: Never Used  . Alcohol use Yes     Comment: infrequently  . Drug use:     Types: Cocaine  . Sexual activity: Yes    Birth control/ protection: None   Other Topics Concern  . None   Social History Narrative  . None   Additional Social History:    Allergies:   Allergies  Allergen Reactions  . Tylenol [Acetaminophen] Nausea And Vomiting    Labs:  Results for orders placed or performed during the hospital encounter of 11/27/16 (  from the past 48 hour(s))  Rapid urine drug screen (hospital performed)     Status: Abnormal   Collection Time: 11/27/16  1:16 PM  Result Value Ref Range   Opiates NONE DETECTED NONE DETECTED   Cocaine POSITIVE (A) NONE DETECTED   Benzodiazepines NONE DETECTED NONE DETECTED   Amphetamines NONE DETECTED NONE DETECTED   Tetrahydrocannabinol NONE DETECTED NONE DETECTED   Barbiturates NONE DETECTED NONE  DETECTED    Comment:        DRUG SCREEN FOR MEDICAL PURPOSES ONLY.  IF CONFIRMATION IS NEEDED FOR ANY PURPOSE, NOTIFY LAB WITHIN 5 DAYS.        LOWEST DETECTABLE LIMITS FOR URINE DRUG SCREEN Drug Class       Cutoff (ng/mL) Amphetamine      1000 Barbiturate      200 Benzodiazepine   098 Tricyclics       119 Opiates          300 Cocaine          300 THC              50   Comprehensive metabolic panel     Status: Abnormal   Collection Time: 11/27/16  2:40 PM  Result Value Ref Range   Sodium 138 135 - 145 mmol/L   Potassium 4.4 3.5 - 5.1 mmol/L   Chloride 103 101 - 111 mmol/L   CO2 23 22 - 32 mmol/L   Glucose, Bld 73 65 - 99 mg/dL   BUN 22 (H) 6 - 20 mg/dL   Creatinine, Ser 1.15 0.61 - 1.24 mg/dL   Calcium 9.2 8.9 - 10.3 mg/dL   Total Protein 7.7 6.5 - 8.1 g/dL   Albumin 4.4 3.5 - 5.0 g/dL   AST 50 (H) 15 - 41 U/L   ALT 52 17 - 63 U/L   Alkaline Phosphatase 72 38 - 126 U/L   Total Bilirubin 0.8 0.3 - 1.2 mg/dL   GFR calc non Af Amer >60 >60 mL/min   GFR calc Af Amer >60 >60 mL/min    Comment: (NOTE) The eGFR has been calculated using the CKD EPI equation. This calculation has not been validated in all clinical situations. eGFR's persistently <60 mL/min signify possible Chronic Kidney Disease.    Anion gap 12 5 - 15  Ethanol     Status: None   Collection Time: 11/27/16  2:40 PM  Result Value Ref Range   Alcohol, Ethyl (B) <5 <5 mg/dL    Comment:        LOWEST DETECTABLE LIMIT FOR SERUM ALCOHOL IS 5 mg/dL FOR MEDICAL PURPOSES ONLY   Salicylate level     Status: None   Collection Time: 11/27/16  2:40 PM  Result Value Ref Range   Salicylate Lvl <1.4 2.8 - 30.0 mg/dL  Acetaminophen level     Status: Abnormal   Collection Time: 11/27/16  2:40 PM  Result Value Ref Range   Acetaminophen (Tylenol), Serum <10 (L) 10 - 30 ug/mL    Comment:        THERAPEUTIC CONCENTRATIONS VARY SIGNIFICANTLY. A RANGE OF 10-30 ug/mL MAY BE AN EFFECTIVE CONCENTRATION FOR MANY  PATIENTS. HOWEVER, SOME ARE BEST TREATED AT CONCENTRATIONS OUTSIDE THIS RANGE. ACETAMINOPHEN CONCENTRATIONS >150 ug/mL AT 4 HOURS AFTER INGESTION AND >50 ug/mL AT 12 HOURS AFTER INGESTION ARE OFTEN ASSOCIATED WITH TOXIC REACTIONS.   cbc     Status: None   Collection Time: 11/27/16  2:40 PM  Result Value Ref Range  WBC 8.5 4.0 - 10.5 K/uL   RBC 4.75 4.22 - 5.81 MIL/uL   Hemoglobin 15.7 13.0 - 17.0 g/dL   HCT 46.0 39.0 - 52.0 %   MCV 96.8 78.0 - 100.0 fL   MCH 33.1 26.0 - 34.0 pg   MCHC 34.1 30.0 - 36.0 g/dL   RDW 14.6 11.5 - 15.5 %   Platelets 265 150 - 400 K/uL    Current Facility-Administered Medications  Medication Dose Route Frequency Provider Last Rate Last Dose  . alum & mag hydroxide-simeth (MAALOX/MYLANTA) 200-200-20 MG/5ML suspension 30 mL  30 mL Oral PRN Dalia Heading, PA-C      . divalproex (DEPAKOTE ER) 24 hr tablet 500 mg  500 mg Oral BID Patrecia Pour, NP      . gabapentin (NEURONTIN) capsule 300 mg  300 mg Oral TID Patrecia Pour, NP      . hydrOXYzine (ATARAX/VISTARIL) tablet 25 mg  25 mg Oral Q6H PRN Patrecia Pour, NP      . ibuprofen (ADVIL,MOTRIN) tablet 600 mg  600 mg Oral Q8H PRN Dalia Heading, PA-C      . lisinopril (PRINIVIL,ZESTRIL) tablet 40 mg  40 mg Oral Daily Patrecia Pour, NP      . LORazepam (ATIVAN) tablet 0-4 mg  0-4 mg Oral Q6H Christopher Lawyer, PA-C       Followed by  . [START ON 11/29/2016] LORazepam (ATIVAN) tablet 0-4 mg  0-4 mg Oral Q12H Christopher Lawyer, PA-C      . ondansetron [facility]) tablet 4 mg  4 mg Oral Q8H PRN Dalia Heading, PA-C      . thiamine (VITAMIN B-1) tablet 100 mg  100 mg Oral Daily [employer], PA-C       Or  . thiamine (B-1) injection 100 mg  100 mg Intravenous Daily Christopher Lawyer, PA-C      . traZODone (DESYREL) tablet 100 mg  100 mg Oral QHS Patrecia Pour, NP      . zolpidem (AMBIEN) tablet 5 mg  5 mg Oral QHS PRN Dalia Heading, PA-C       Current Outpatient Prescriptions   Medication Sig Dispense Refill  . benztropine (COGENTIN) 1 MG tablet Take 1 mg by mouth 2 (two) times daily.    . pregabalin (LYRICA) 50 MG capsule Take 50 mg by mouth 2 (two) times daily.    . QUEtiapine (SEROQUEL) 100 MG tablet Take 100 mg by mouth at bedtime.    Marland Kitchen QUEtiapine (SEROQUEL) 50 MG tablet Take 50 mg by mouth 2 (two) times daily.    Marland Kitchen albuterol (PROVENTIL HFA;VENTOLIN HFA) 108 (90 Base) MCG/ACT inhaler Inhale 1-2 puffs into the lungs every 4 (four) hours as needed for shortness of breath.    . divalproex (DEPAKOTE ER) 500 MG 24 hr tablet Take 1 tablet (500 mg total) by mouth 2 (two) times daily. (Patient not taking: Reported on 10/29/2016) 60 tablet 0  . doxycycline (VIBRA-TABS) 100 MG tablet Take 1 tablet (100 mg total) by mouth 2 (two) times daily. (Patient not taking: Reported on 10/29/2016) 14 tablet 0  . gabapentin (NEURONTIN) 300 MG capsule Take 1 capsule (300 mg total) by mouth 3 (three) times daily. (Patient not taking: Reported on 10/29/2016) 90 capsule 0  . hydrOXYzine (ATARAX/VISTARIL) 25 MG tablet Take 1 tablet (25 mg total) by mouth every 6 (six) hours as needed for anxiety. (Patient not taking: Reported on 10/29/2016) 30 tablet 0  . lisinopril (PRINIVIL,ZESTRIL) 40 MG tablet Take 1 tablet (  40 mg total) by mouth daily. 30 tablet 0  . predniSONE (DELTASONE) 20 MG tablet Take 2 tablets (40 mg total) by mouth daily. (Patient not taking: Reported on 10/29/2016) 8 tablet 0  . traZODone (DESYREL) 100 MG tablet Take 1 tablet (100 mg total) by mouth at bedtime. For sleep (Patient not taking: Reported on 10/29/2016) 30 tablet 0    Musculoskeletal: Strength & Muscle Tone: within normal limits Gait & Station: normal Patient leans: N/A  Psychiatric Specialty Exam: Physical Exam  Constitutional: He is oriented to person, place, and time. He appears well-developed and well-nourished.  HENT:  Head: Normocephalic.  Neck: Normal range of motion.  Respiratory: Effort normal.   Musculoskeletal: Normal range of motion.  Neurological: He is alert and oriented to person, place, and time.  Psychiatric: He has a normal mood and affect. His speech is normal and behavior is normal. Judgment and thought content normal. Cognition and memory are normal.    Review of Systems  Constitutional: Negative.   HENT: Negative.   Eyes: Negative.   Respiratory: Negative.   Cardiovascular: Negative.   Gastrointestinal: Negative.   Genitourinary: Negative.   Musculoskeletal: Negative.   Skin: Negative.   Neurological: Negative.   Endo/Heme/Allergies: Negative.   Psychiatric/Behavioral: Positive for substance abuse.    Blood pressure 137/94, pulse 72, temperature 98.6 F (37 C), temperature source Oral, resp. rate 18, height '5\' 9"'  (1.753 m), weight 83.9 kg (185 lb), SpO2 100 %.Body mass index is 27.32 kg/m.  General Appearance:  Casual  Eye Contact:  Good  Speech:  Normal  Volume:  Normal  Mood:  Irritable  Affect:  Euthymic  Thought Process:  Coherent  Orientation:  Full (Time, Place, and Person)  Thought Content:  Logical  Suicidal Thoughts:  No  Homicidal Thoughts:  No  Memory:  Immediate;   Good Recent;   Good Remote;   Good  Judgement:  Fair  Insight:  Fair  Psychomotor Activity:  Normal  Concentration:  Concentration: Fair and Attention Span: Fair  Recall:  Good  Fund of Knowledge:  Fair  Language:  Good  Akathisia:  No  Handed:  Right  AIMS (if indicated):     Assets:  Physical Health, resilience   ADL's:  Intact  Cognition:  WNL  Sleep:   good     Treatment Plan Summary: Daily contact with patient to assess and evaluate symptoms and progress in treatment and Medication management Cocaine abuse with cocaine induced mood disorder: Start Depakote ER 500 mg bid for mood lability, Gabapentin 300 mg TID for withdrawal symptoms, and Trazodone 100 mg qhs for sleep -Crisis stabilization -Individual and substance abuse counseling -Rehab referral to  substance abuse treatment provided  Disposition: Discharge home  Waylan Boga, NP 11/28/2016 7:00 AM  Patient seen face-to-face for psychiatric evaluation, chart reviewed and case discussed with the physician extender and developed treatment plan. Reviewed the information documented and agree with the treatment plan. Corena Pilgrim, MD

## 2016-11-28 NOTE — ED Notes (Signed)
Patient is sleeping at this time will not disturb. Will let day shift aware of patient vitals needed to be taken

## 2016-11-28 NOTE — ED Notes (Signed)
Pt discharged ambulatory.  Refused medications before D/C  Refused Discharge instructions.  Pt was very irritable.  All belongings were sent with patient.

## 2016-12-01 NOTE — ED Provider Notes (Signed)
[facility]-EMERGENCY DEPT Provider Note   CSN: [id number] Arrival date & time: 11/27/16  1256     History   Chief Complaint Chief Complaint  Patient presents with  . IVC  . Suicidal    [name] is a 51 y.o. male.  HPI Patient presents to the emergency department with suicidal ideation under IVC commitment.  The patient states that he had some suicidal thoughts that got worse over the last few days.  The patient states nothing seems make her condition better or worse.  Patient states that he does use alcohol and cocaine states that he would like to get some help because nobody will help him. The patient denies chest pain, shortness of breath, headache,blurred vision, neck pain, fever, cough, weakness, numbness, dizziness, anorexia, edema, abdominal pain, nausea, vomiting, diarrhea, rash, back pain, dysuria, hematemesis, bloody stool, near syncope, or syncope. Past Medical History:  Diagnosis Date  . Asthma   . Bipolar 1 disorder ([facility])   . Depression   . Homelessness   . Hypertension   . Schizoaffective disorder [facility])     Patient Active Problem List   Diagnosis Date Noted  . Cocaine abuse with cocaine-induced mood disorder ([facility]) 09/06/2016  . Alcohol use disorder, mild, abuse 07/31/2016  . Cocaine use disorder, severe, dependence ([facility]) 05/31/2016  . Essential hypertension 05/31/2016    History reviewed. No pertinent surgical history.     Home Medications    Prior to Admission medications   Medication Sig Start Date End Date Taking? Authorizing Provider  benztropine (COGENTIN) 1 MG tablet Take 1 mg by mouth 2 (two) times daily. 11/07/16 12/07/16 Yes Historical Provider, MD  pregabalin (LYRICA) 50 MG capsule Take 50 mg by mouth 2 (two) times daily. 11/07/16 12/07/16 Yes Historical Provider, MD  QUEtiapine (SEROQUEL) 100 MG tablet Take 100 mg by mouth at bedtime. 11/07/16 12/07/16 Yes Historical Provider, MD  QUEtiapine (SEROQUEL) 50 MG tablet Take 50 mg by mouth 2 (two)  times daily. 11/07/16 12/07/16 Yes Historical Provider, MD  albuterol (PROVENTIL HFA;VENTOLIN HFA) 108 (90 Base) MCG/ACT inhaler Inhale 1-2 puffs into the lungs every 4 (four) hours as needed for shortness of breath.    Historical Provider, MD  divalproex (DEPAKOTE ER) 500 MG 24 hr tablet Take 1 tablet (500 mg total) by mouth 2 (two) times daily. Patient not taking: Reported on 10/29/2016 08/06/16   Adonis Brook, NP  doxycycline (VIBRA-TABS) 100 MG tablet Take 1 tablet (100 mg total) by mouth 2 (two) times daily. Patient not taking: Reported on 10/29/2016 09/19/16   Kathee Delton, MD  gabapentin (NEURONTIN) 300 MG capsule Take 1 capsule (300 mg total) by mouth 3 (three) times daily. Patient not taking: Reported on 10/29/2016 08/06/16   Adonis Brook, NP  hydrOXYzine (ATARAX/VISTARIL) 25 MG tablet Take 1 tablet (25 mg total) by mouth every 6 (six) hours as needed for anxiety. Patient not taking: Reported on 10/29/2016 08/06/16   Adonis Brook, NP  lisinopril (PRINIVIL,ZESTRIL) 40 MG tablet Take 1 tablet (40 mg total) by mouth daily. 08/15/16   Mercedes Camprubi-Soms, PA-C  predniSONE (DELTASONE) 20 MG tablet Take 2 tablets (40 mg total) by mouth daily. Patient not taking: Reported on 10/29/2016 08/24/16   Renne Crigler, PA-C  traZODone (DESYREL) 100 MG tablet Take 1 tablet (100 mg total) by mouth at bedtime. For sleep Patient not taking: Reported on 10/29/2016 08/05/16   Adonis Brook, NP    Family History Family History  Problem Relation Age of Onset  . Drug abuse  Mother   . Suicidality Mother     Social History Social History  Substance Use Topics  . Smoking status: Current Every Day Smoker    Types: Cigarettes  . Smokeless tobacco: Never Used  . Alcohol use Yes     Comment: infrequently     Allergies   Tylenol [acetaminophen]   Review of Systems Review of Systems All other systems negative except as documented in the HPI. All pertinent positives and negatives as reviewed in the  HPI.  Physical Exam Updated Vital Signs BP 144/90   Pulse 70   Temp 97.8 F (36.6 C) (Oral)   Resp 18   Ht 5\' 9"  (1.753 m)   Wt 83.9 kg   SpO2 100%   BMI 27.32 kg/m   Physical Exam  Constitutional: He is oriented to person, place, and time. He appears well-developed and well-nourished. No distress.  HENT:  Head: Normocephalic and atraumatic.  Mouth/Throat: Oropharynx is clear and moist.  Eyes: Pupils are equal, round, and reactive to light.  Neck: Normal range of motion. Neck supple.  Cardiovascular: Normal rate, regular rhythm and normal heart sounds.  Exam reveals no gallop and no friction rub.   No murmur heard. Pulmonary/Chest: Effort normal and breath sounds normal. No respiratory distress. He has no wheezes.  Abdominal: Soft. Bowel sounds are normal. He exhibits no distension. There is no tenderness.  Neurological: He is alert and oriented to person, place, and time. He exhibits normal muscle tone. Coordination normal.  Skin: Skin is warm and dry. No rash noted. No erythema.  Psychiatric: His mood appears anxious. He is agitated. He expresses suicidal ideation.  Nursing note and vitals reviewed.    ED Treatments / Results  Labs (all labs ordered are listed, but only abnormal results are displayed) Labs Reviewed  COMPREHENSIVE METABOLIC PANEL - Abnormal; Notable for the following:       Result Value   BUN 22 (*)    AST 50 (*)    All other components within normal limits  ACETAMINOPHEN LEVEL - Abnormal; Notable for the following:    Acetaminophen (Tylenol), Serum <10 (*)    All other components within normal limits  RAPID URINE DRUG SCREEN, HOSP PERFORMED - Abnormal; Notable for the following:    Cocaine POSITIVE (*)    All other components within normal limits  ETHANOL  SALICYLATE LEVEL  CBC    EKG  EKG Interpretation None       Radiology No results found.  Procedures Procedures (including critical care time)  Medications Ordered in  ED Medications - No data to display   Initial Impression / Assessment and Plan / ED Course  I have reviewed the triage vital signs and the nursing notes.  Pertinent labs & imaging results that were available during my care of the patient were reviewed by me and considered in my medical decision making (see chart for details).  Clinical Course     The patientwill need TTS assessment for suicidal ideation  Final Clinical Impressions(s) / ED Diagnoses   Final diagnoses:  Cocaine abuse with cocaine-induced mood disorder [facility])    New Prescriptions Discharge Medication List as of 11/28/2016  9:45 AM       Charlestine Night, PA-C 12/01/16 0149    Mancel Bale, MD 12/01/16 [phone]

## 2016-12-14 ENCOUNTER — Emergency Department (HOSPITAL_COMMUNITY)
Admission: EM | Admit: 2016-12-14 | Discharge: 2016-12-14 | Disposition: A | Discharge status: Home / Self Care | Payer: Medicaid Other | Attending: Emergency Medicine | Admitting: Emergency Medicine

## 2016-12-14 ENCOUNTER — Encounter (HOSPITAL_COMMUNITY): Payer: Self-pay | Admitting: *Deleted

## 2016-12-14 ENCOUNTER — Emergency Department (HOSPITAL_COMMUNITY): Payer: Medicaid Other

## 2016-12-14 DIAGNOSIS — J4521 Mild intermittent asthma with (acute) exacerbation: Secondary | ICD-10-CM | POA: Diagnosis not present

## 2016-12-14 DIAGNOSIS — R059 Cough, unspecified: Secondary | ICD-10-CM

## 2016-12-14 DIAGNOSIS — R05 Cough: Secondary | ICD-10-CM | POA: Diagnosis present

## 2016-12-14 DIAGNOSIS — I10 Essential (primary) hypertension: Secondary | ICD-10-CM | POA: Diagnosis not present

## 2016-12-14 DIAGNOSIS — F17210 Nicotine dependence, cigarettes, uncomplicated: Secondary | ICD-10-CM | POA: Diagnosis not present

## 2016-12-14 IMAGING — CR DG CHEST 2V
2 series · 2 of 2 positions shown · non-contrast
Comparison: [DATE]

CLINICAL DATA: Pt reports chest pains and a productive cough
recently. Pt refuses to give specifics on hx of symptoms. Hx current
smoker.

EXAM:
CHEST  2 VIEW

[chest pa]
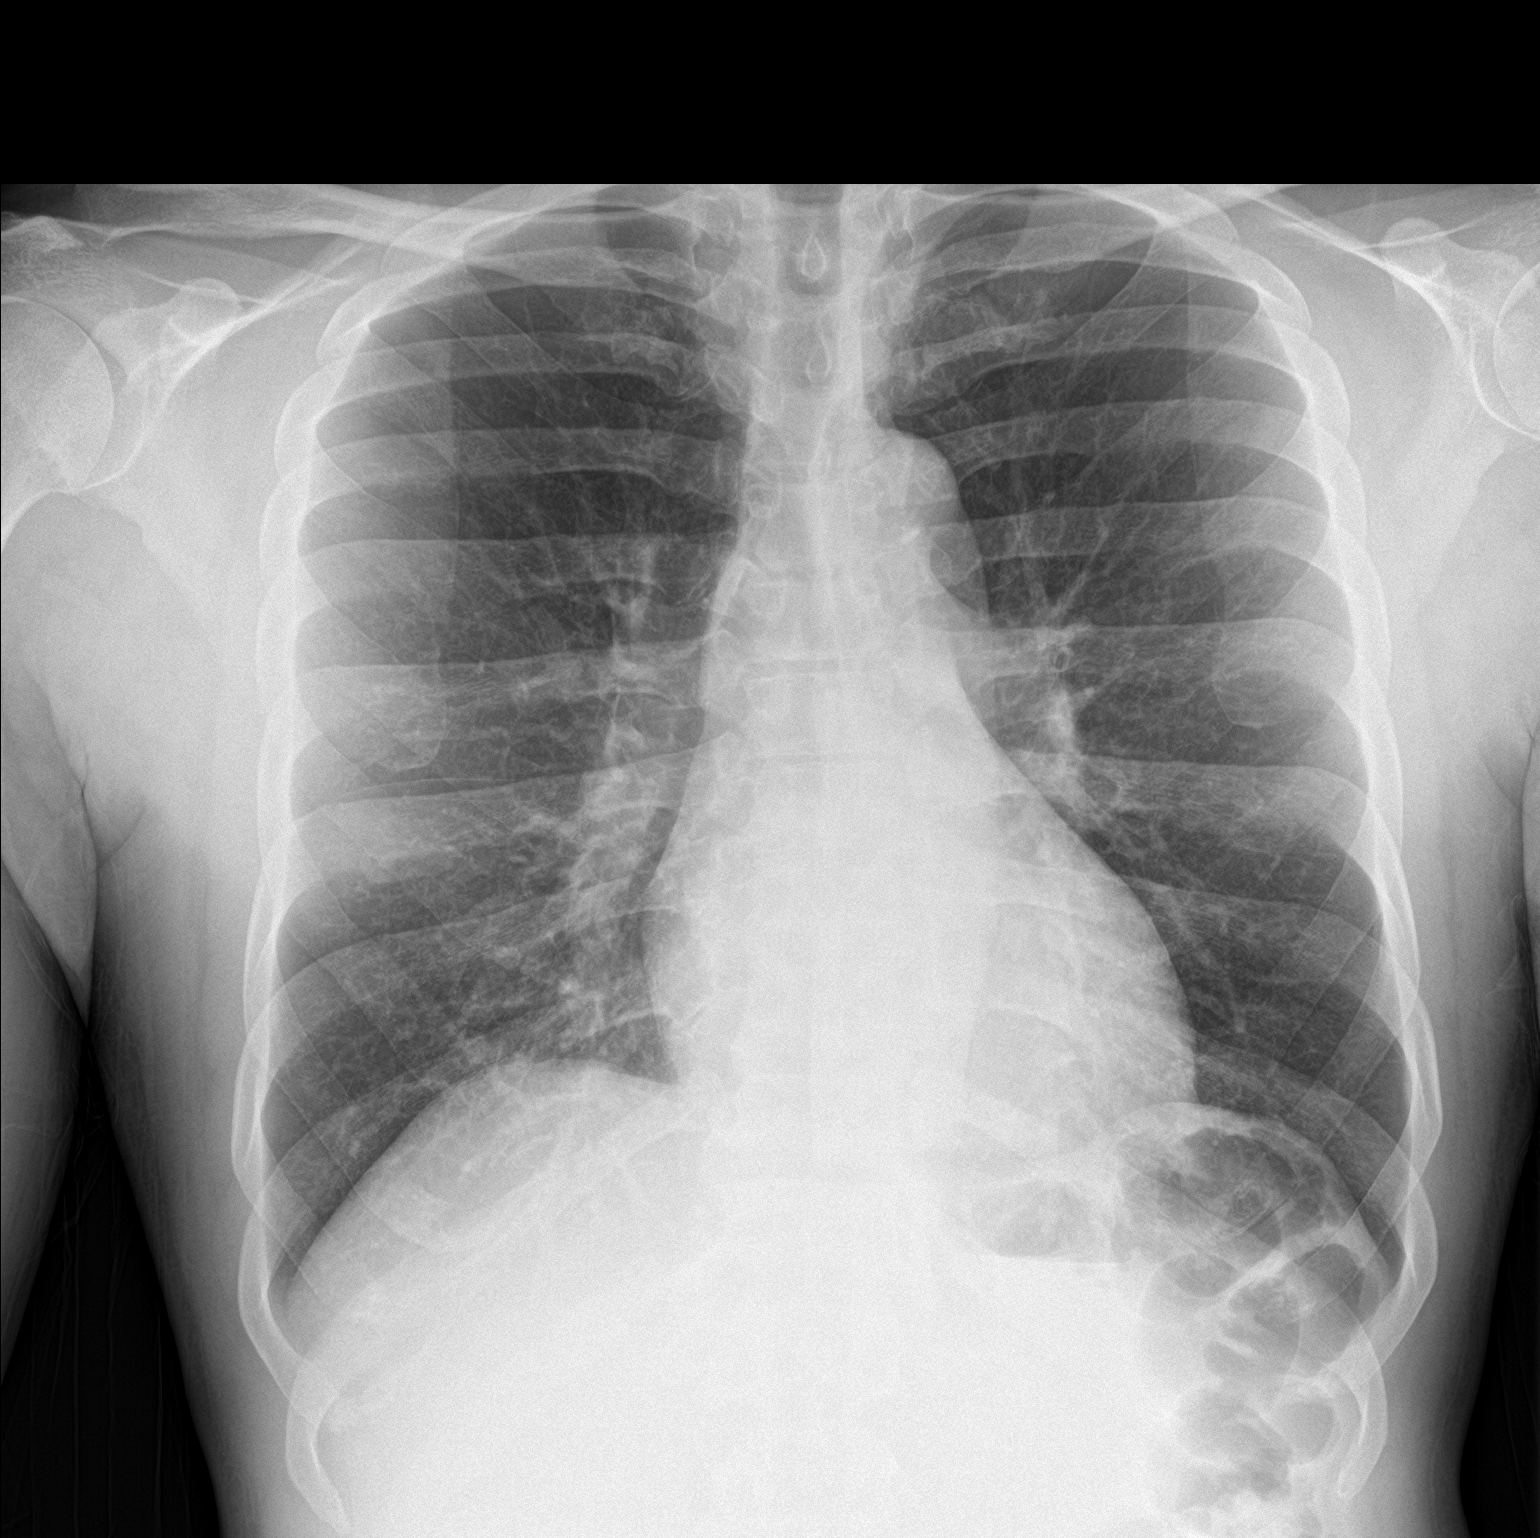

[chest lat]
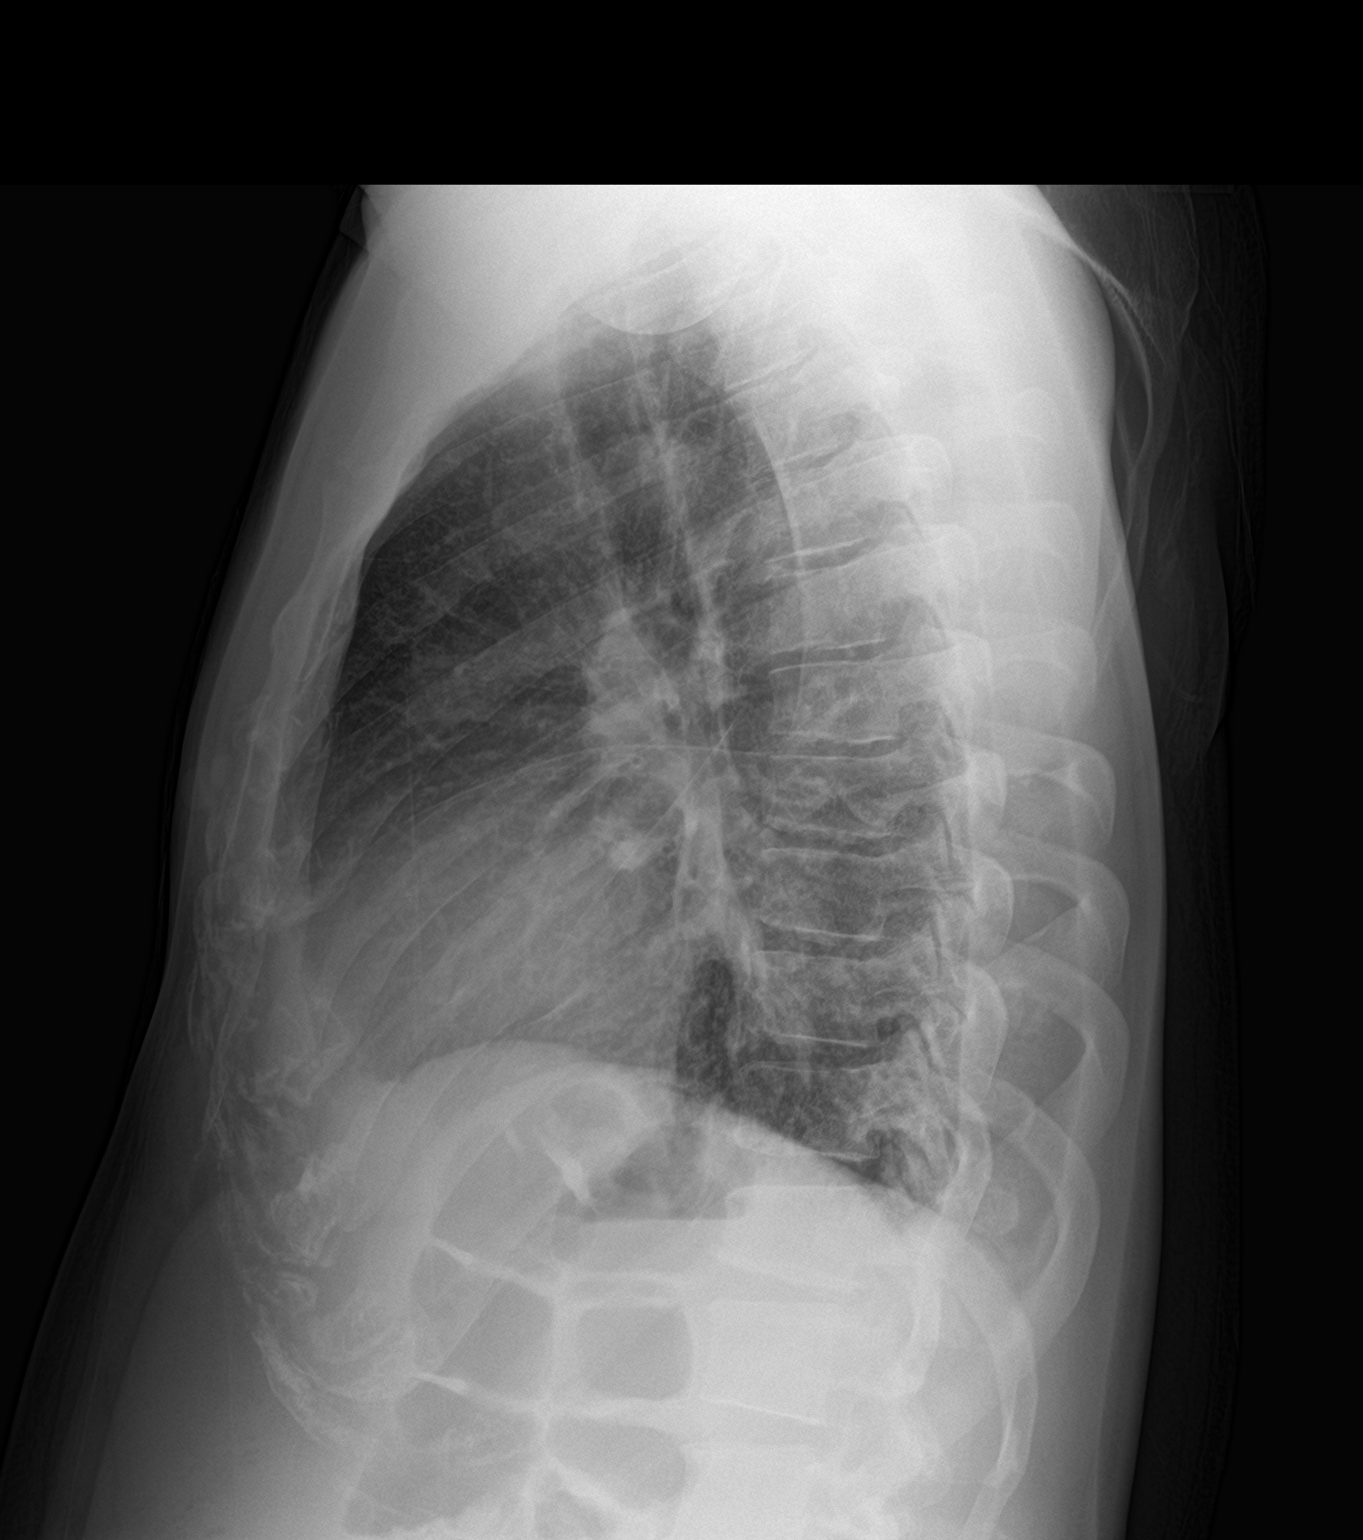

[2 of 2 positions shown; findings below may reference images not displayed]

FINDINGS: The heart size and mediastinal contours are within normal limits.
Both lungs are clear. No pleural effusion or pneumothorax. The
visualized skeletal structures are unremarkable.
IMPRESSION: No active cardiopulmonary disease.

## 2016-12-14 MED ORDER — ALBUTEROL SULFATE HFA 108 (90 BASE) MCG/ACT IN AERS
4.0000 | INHALATION_SPRAY | Freq: Once | RESPIRATORY_TRACT | Status: AC
  Administered 2016-12-14: 4 via RESPIRATORY_TRACT
  Filled 2016-12-14: qty 6.7

## 2016-12-14 MED ORDER — PREDNISONE 20 MG PO TABS
60.0000 mg | ORAL_TABLET | Freq: Once | ORAL | Status: AC
  Administered 2016-12-14: 60 mg via ORAL
  Filled 2016-12-14: qty 3

## 2016-12-14 NOTE — ED Notes (Addendum)
Pt agitated with wait time. This tech apologized for the delay, saying we have a bed ready for you. The RN and Dr will be with you shortly. Pt stated he can't breath. Took vitals. 02 sats 100%. No respiratory distress noted.

## 2016-12-14 NOTE — ED Triage Notes (Signed)
Pt reports having productive cough and chest congestion for several days. No relief with inhalers at home. No resp distress noted. Denies fever.

## 2016-12-14 NOTE — ED Notes (Addendum)
Pt walked out appeared in no distress did not want to wait for physician ER MD made aware

## 2016-12-14 NOTE — ED Provider Notes (Signed)
[facility]ENCY DEPT Provider Note   CSN: [id number] Arrival date & time: 12/14/16  1243   History   Chief Complaint Chief Complaint  Patient presents with  . Cough   [name] is a 51 y.o. male.  The history is provided by the patient. No language interpreter was used.  Illness  This is a new problem. The current episode started more than 2 days ago. The problem occurs constantly. The problem has not changed since onset.Associated symptoms include shortness of breath. Pertinent negatives include no chest pain, no abdominal pain and no headaches. Nothing aggravates the symptoms. Nothing relieves the symptoms.    Past Medical History:  Diagnosis Date  . Asthma   . Bipolar 1 disorder (HCC)   . Depression   . Homelessness   . Hypertension   . Schizoaffective disorder [facility])    Patient Active Problem List   Diagnosis Date Noted  . Cocaine abuse with cocaine-induced mood disorder (HCC) 09/06/2016  . Alcohol use disorder, mild, abuse 07/31/2016  . Cocaine use disorder, severe, dependence (HCC) 05/31/2016  . Essential hypertension 05/31/2016   History reviewed. No pertinent surgical history.   Home Medications    Prior to Admission medications   Medication Sig Start Date End Date Taking? Authorizing Provider  albuterol (PROVENTIL HFA;VENTOLIN HFA) 108 (90 Base) MCG/ACT inhaler Inhale 1-2 puffs into the lungs every 4 (four) hours as needed for shortness of breath.    Historical Provider, MD  benztropine (COGENTIN) 1 MG tablet Take 1 mg by mouth 2 (two) times daily. 11/07/16 12/07/16  Historical Provider, MD  divalproex (DEPAKOTE ER) 500 MG 24 hr tablet Take 1 tablet (500 mg total) by mouth 2 (two) times daily. Patient not taking: Reported on 10/29/2016 08/06/16   Adonis Brook, NP  doxycycline (VIBRA-TABS) 100 MG tablet Take 1 tablet (100 mg total) by mouth 2 (two) times daily. Patient not taking: Reported on 10/29/2016 09/19/16   Kathee Delton, MD  gabapentin (NEURONTIN)  300 MG capsule Take 1 capsule (300 mg total) by mouth 3 (three) times daily. Patient not taking: Reported on 10/29/2016 08/06/16   Adonis Brook, NP  hydrOXYzine (ATARAX/VISTARIL) 25 MG tablet Take 1 tablet (25 mg total) by mouth every 6 (six) hours as needed for anxiety. Patient not taking: Reported on 10/29/2016 08/06/16   Adonis Brook, NP  lisinopril (PRINIVIL,ZESTRIL) 40 MG tablet Take 1 tablet (40 mg total) by mouth daily. 08/15/16   Mercedes [address]pp Street, PA-C  predniSONE (DELTASONE) 20 MG tablet Take 2 tablets (40 mg total) by mouth daily. Patient not taking: Reported on 10/29/2016 08/24/16   Renne Crigler, PA-C  pregabalin (LYRICA) 50 MG capsule Take 50 mg by mouth 2 (two) times daily. 11/07/16 12/07/16  Historical Provider, MD  traZODone (DESYREL) 100 MG tablet Take 1 tablet (100 mg total) by mouth at bedtime. For sleep Patient not taking: Reported on 10/29/2016 08/05/16   Adonis Brook, NP   Family History Family History  Problem Relation Age of Onset  . Drug abuse Mother   . Suicidality Mother    Social History Social History  Substance Use Topics  . Smoking status: Current Every Day Smoker    Types: Cigarettes  . Smokeless tobacco: Never Used  . Alcohol use Yes     Comment: infrequently    Allergies   Tylenol [acetaminophen]  Review of Systems Review of Systems  HENT: Positive for congestion and rhinorrhea.   Respiratory: Positive for cough, shortness of breath and wheezing.   Cardiovascular: Negative  for chest pain.  Gastrointestinal: Negative for abdominal pain.  Neurological: Negative for headaches.  All other systems reviewed and are negative.   Physical Exam Updated Vital Signs BP 168/96 (BP Location: Left Arm)   Pulse 66   Temp 97.9 F (36.6 C) (Oral)   Resp 17   SpO2 100%   Physical Exam  Constitutional: He is oriented to person, place, and time. He appears well-developed and well-nourished. No distress.  HENT:  Head: Normocephalic and atraumatic.    Eyes: EOM are normal. Pupils are equal, round, and reactive to light.  Neck: Normal range of motion. Neck supple.  Cardiovascular: Normal rate, regular rhythm and normal heart sounds.   Pulmonary/Chest: Effort normal. No respiratory distress. He has wheezes. He has no rales.  Breathing comfortably and maintaining sats on room air, no increased work of breathing, wheezing throughout  Abdominal: Soft. Bowel sounds are normal. He exhibits no distension. There is no tenderness.  Musculoskeletal: Normal range of motion.  Neurological: He is alert and oriented to person, place, and time.  Skin: Skin is warm and dry. Capillary refill takes less than 2 seconds. He is not diaphoretic.  Nursing note and vitals reviewed.   ED Treatments / Results  Labs (all labs ordered are listed, but only abnormal results are displayed) Labs Reviewed - No data to display  EKG  EKG Interpretation None      Radiology Dg Chest 2 View  Result Date: 12/14/2016 CLINICAL DATA:  Pt reports chest pains and a productive cough recently. Pt refuses to give specifics on hx of symptoms. Hx current smoker. EXAM: CHEST  2 VIEW COMPARISON:  07/09/2016 FINDINGS: The heart size and mediastinal contours are within normal limits. Both lungs are clear. No pleural effusion or pneumothorax. The visualized skeletal structures are unremarkable. IMPRESSION: No active cardiopulmonary disease. Electronically Signed   By: Amie Portland M.D.   On: 12/14/2016 14:33   Procedures Procedures (including critical care time)  Medications Ordered in ED Medications  albuterol (PROVENTIL HFA;VENTOLIN HFA) 108 (90 Base) MCG/ACT inhaler 4 puff (4 puffs Inhalation Given 12/14/16 1650)  predniSONE (DELTASONE) tablet 60 mg (60 mg Oral Given 12/14/16 1649)    Initial Impression / Assessment and Plan / ED Course  I have reviewed the triage vital signs and the nursing notes.  Clinical Course   51 y.o. male with above stated PMHx, HPI, and physical.  PMHx of asthma (ran out of albuterol INH several weeks ago). Recent URI symptoms now with wheezing.  Pt without increased WOB or hypoxia. Wheezing throughout. CXR showing no PNA. Called [facility] to provide albuterol INH. Ordered prednisone and called social work to provide resources to get medications filled through crisis control. Pt left before entire treatment plan completed given that he did not want to wait any longer.  Imaging results were personally reviewed by myself and used in the medical decision making of this patient's treatment and disposition.  Pt care discussed with and followed by my attending, Dr. Hollice Gong, MD Pager [phone]  Final Clinical Impressions(s) / ED Diagnoses   Final diagnoses:  Cough  Mild intermittent asthma with exacerbation   New Prescriptions Discharge Medication List as of 12/14/2016  5:01 PM       Angelina Ok, MD 12/15/16 [id number]    Margarita Grizzle, MD 12/18/16 1345

## 2016-12-25 ENCOUNTER — Emergency Department (EMERGENCY_DEPARTMENT_HOSPITAL)
Admission: EM | Admit: 2016-12-25 | Discharge: 2016-12-26 | Disposition: A | Discharge status: Other Acute Inpatient Hospital | Payer: Medicaid Other | Source: Home / Self Care | Attending: Emergency Medicine | Admitting: Emergency Medicine

## 2016-12-25 ENCOUNTER — Encounter: Payer: Self-pay | Admitting: Emergency Medicine

## 2016-12-25 DIAGNOSIS — F1410 Cocaine abuse, uncomplicated: Secondary | ICD-10-CM

## 2016-12-25 DIAGNOSIS — F1010 Alcohol abuse, uncomplicated: Secondary | ICD-10-CM | POA: Insufficient documentation

## 2016-12-25 DIAGNOSIS — I10 Essential (primary) hypertension: Secondary | ICD-10-CM | POA: Insufficient documentation

## 2016-12-25 DIAGNOSIS — Z5181 Encounter for therapeutic drug level monitoring: Secondary | ICD-10-CM

## 2016-12-25 DIAGNOSIS — F200 Paranoid schizophrenia: Secondary | ICD-10-CM

## 2016-12-25 DIAGNOSIS — F1414 Cocaine abuse with cocaine-induced mood disorder: Secondary | ICD-10-CM | POA: Diagnosis present

## 2016-12-25 DIAGNOSIS — J45909 Unspecified asthma, uncomplicated: Secondary | ICD-10-CM

## 2016-12-25 DIAGNOSIS — F17210 Nicotine dependence, cigarettes, uncomplicated: Secondary | ICD-10-CM | POA: Insufficient documentation

## 2016-12-25 DIAGNOSIS — F1910 Other psychoactive substance abuse, uncomplicated: Secondary | ICD-10-CM | POA: Insufficient documentation

## 2016-12-25 DIAGNOSIS — R45851 Suicidal ideations: Secondary | ICD-10-CM

## 2016-12-25 LAB — CBC WITH DIFFERENTIAL/PLATELET
BASOS PCT: 0 %
Basophils Absolute: 0 10*3/uL (ref 0–0.1)
Eosinophils Absolute: 0.1 10*3/uL (ref 0–0.7)
Eosinophils Relative: 1 %
HEMATOCRIT: 42.5 % (ref 40.0–52.0)
HEMOGLOBIN: 14.2 g/dL (ref 13.0–18.0)
LYMPHS ABS: 2.1 10*3/uL (ref 1.0–3.6)
Lymphocytes Relative: 43 %
MCH: 33.2 pg (ref 26.0–34.0)
MCHC: 33.4 g/dL (ref 32.0–36.0)
MCV: 99.2 fL (ref 80.0–100.0)
MONOS PCT: 8 %
Monocytes Absolute: 0.4 10*3/uL (ref 0.2–1.0)
NEUTROS ABS: 2.3 10*3/uL (ref 1.4–6.5)
NEUTROS PCT: 48 %
Platelets: 238 10*3/uL (ref 150–440)
RBC: 4.28 MIL/uL — ABNORMAL LOW (ref 4.40–5.90)
RDW: 14.6 % — ABNORMAL HIGH (ref 11.5–14.5)
WBC: 4.9 10*3/uL (ref 3.8–10.6)

## 2016-12-25 LAB — COMPREHENSIVE METABOLIC PANEL
ALBUMIN: 3.5 g/dL (ref 3.5–5.0)
ALK PHOS: 67 U/L (ref 38–126)
ALT: 71 U/L — ABNORMAL HIGH (ref 17–63)
ANION GAP: 5 (ref 5–15)
AST: 56 U/L — ABNORMAL HIGH (ref 15–41)
BILIRUBIN TOTAL: 0.5 mg/dL (ref 0.3–1.2)
BUN: 17 mg/dL (ref 6–20)
CALCIUM: 8.6 mg/dL — AB (ref 8.9–10.3)
CO2: 28 mmol/L (ref 22–32)
CREATININE: 1.21 mg/dL (ref 0.61–1.24)
Chloride: 105 mmol/L (ref 101–111)
GFR calc Af Amer: >60 mL/min (ref >60)
GFR calc non Af Amer: >60 mL/min (ref >60)
GLUCOSE: 93 mg/dL (ref 65–99)
Potassium: 4.1 mmol/L (ref 3.5–5.1)
Sodium: 138 mmol/L (ref 135–145)
TOTAL PROTEIN: 6.3 g/dL — AB (ref 6.5–8.1)

## 2016-12-25 LAB — URINE DRUG SCREEN, QUALITATIVE (ARMC ONLY)
Amphetamines, Ur Screen: NOT DETECTED
BARBITURATES, UR SCREEN: NOT DETECTED
Benzodiazepine, Ur Scrn: POSITIVE — AB
CANNABINOID 50 NG, UR ~~LOC~~: NOT DETECTED
COCAINE METABOLITE, UR ~~LOC~~: POSITIVE — AB
MDMA (Ecstasy)Ur Screen: NOT DETECTED
Methadone Scn, Ur: NOT DETECTED
OPIATE, UR SCREEN: NOT DETECTED
PHENCYCLIDINE (PCP) UR S: NOT DETECTED
TRICYCLIC, UR SCREEN: NOT DETECTED

## 2016-12-25 LAB — TROPONIN I: Troponin I: <0.03 ng/mL (ref <0.03)

## 2016-12-25 LAB — ETHANOL: Alcohol, Ethyl (B): <5 mg/dL (ref <5)

## 2016-12-25 MED ORDER — QUETIAPINE FUMARATE 200 MG PO TABS
200.0000 mg | ORAL_TABLET | Freq: Every day | ORAL | Status: DC
  Administered 2016-12-25: 200 mg via ORAL
  Filled 2016-12-25: qty 1

## 2016-12-25 MED ORDER — TRAZODONE HCL 50 MG PO TABS
150.0000 mg | ORAL_TABLET | Freq: Every day | ORAL | Status: DC
  Administered 2016-12-25: 150 mg via ORAL
  Filled 2016-12-25: qty 1

## 2016-12-25 MED ORDER — ALBUTEROL SULFATE HFA 108 (90 BASE) MCG/ACT IN AERS
2.0000 | INHALATION_SPRAY | RESPIRATORY_TRACT | Status: DC | PRN
  Administered 2016-12-25: 2 via RESPIRATORY_TRACT
  Filled 2016-12-25: qty 6.7

## 2016-12-25 MED ORDER — LISINOPRIL 5 MG PO TABS
10.0000 mg | ORAL_TABLET | Freq: Every day | ORAL | Status: DC
Start: 2016-12-25 — End: 2016-12-26
  Administered 2016-12-25: 10 mg via ORAL
  Filled 2016-12-25: qty 2

## 2016-12-25 NOTE — ED Provider Notes (Signed)
[facility] Emergency Department Provider Note        Time seen: ----------------------------------------- 2:30 PM on 12/25/2016 -----------------------------------------    I have reviewed the triage vital signs and the nursing notes.   HISTORY  Chief Complaint Psychiatric Evaluation    [name] is a 51 y.o. male who presents to the ER requesting help for alcohol and drug abuse. Patient also reports suicidal and homicidal ideation. Patient was just seen at residential treatment services of [facility] and states that he was given medication that made him feel bad, he reported this to [facility] but states they did not care. He states he is having thoughts of hurting himself.   Past Medical History:  Diagnosis Date  . Asthma   . Bipolar 1 disorder (HCC)   . Depression   . Homelessness   . Hypertension   . Schizoaffective disorder [facility])     Patient Active Problem List   Diagnosis Date Noted  . Cocaine abuse with cocaine-induced mood disorder (HCC) 09/06/2016  . Alcohol use disorder, mild, abuse 07/31/2016  . Cocaine use disorder, severe, dependence (HCC) 05/31/2016  . Essential hypertension 05/31/2016    No past surgical history on file.  Allergies Tylenol [acetaminophen]  Social History Social History  Substance Use Topics  . Smoking status: Current Every Day Smoker    Types: Cigarettes  . Smokeless tobacco: Never Used  . Alcohol use Yes     Comment: infrequently    Review of Systems Constitutional: Negative for fever. Cardiovascular: Negative for chest pain. Respiratory: Negative for shortness of breath. Gastrointestinal: Negative for abdominal pain, vomiting and diarrhea. Genitourinary: Negative for dysuria. Musculoskeletal: Negative for back pain. Skin: Negative for rash. Neurological: Negative for headaches, focal weakness or numbness. Psychiatric: Positive for suicidal and homicidal ideation, positive for  polysubstance abuse  10-point ROS otherwise negative.  ____________________________________________   PHYSICAL EXAM:  VITAL SIGNS: ED Triage Vitals  Enc Vitals Group     BP      Pulse      Resp      Temp      Temp src      SpO2      Weight      Height      Head Circumference      Peak Flow      Pain Score      Pain Loc      Pain Edu?      Excl. in GC?     Constitutional: Alert and oriented. Well appearing and in no distress. Eyes: Conjunctivae are normal. PERRL. Normal extraocular movements. ENT   Head: Normocephalic and atraumatic.   Nose: No congestion/rhinnorhea.   Mouth/Throat: Mucous membranes are moist.   Neck: No stridor. Cardiovascular: Normal rate, regular rhythm. No murmurs, rubs, or gallops. Respiratory: Normal respiratory effort without tachypnea nor retractions. Breath sounds are clear and equal bilaterally. No wheezes/rales/rhonchi. Gastrointestinal: Soft and nontender. Normal bowel sounds Musculoskeletal: Nontender with normal range of motion in all extremities. No lower extremity tenderness nor edema. Neurologic:  Normal speech and language. No gross focal neurologic deficits are appreciated.  Skin:  Skin is warm, dry and intact. No rash noted. Psychiatric: Mood and affect are normal. Speech and behavior are normal.  ___________________________________________  ED COURSE:  Pertinent labs & imaging results that were available during my care of the patient were reviewed by me and considered in my medical decision making (see chart for details). Patient presents to the ER in no distress.  We will assess with labs and likely consult psychiatry. He is here voluntarily at this time.   Procedures ____________________________________________   LABS (pertinent positives/negatives)  Labs Reviewed  CBC WITH DIFFERENTIAL/PLATELET - Abnormal; Notable for the following:       Result Value   RBC 4.28 (*)    RDW 14.6 (*)    All other components  within normal limits  COMPREHENSIVE METABOLIC PANEL - Abnormal; Notable for the following:    Calcium 8.6 (*)    Total Protein 6.3 (*)    AST 56 (*)    ALT 71 (*)    All other components within normal limits  URINE DRUG SCREEN, QUALITATIVE ([facility] ONLY) - Abnormal; Notable for the following:    Cocaine Metabolite,Ur [place] POSITIVE (*)    Benzodiazepine, Ur [place] POSITIVE (*)    All other components within normal limits  TROPONIN I  ETHANOL    ____________________________________________  FINAL ASSESSMENT AND PLAN  Suicidal ideation, polysubstance abuse  Plan: Patient with labs and imaging as dictated above. Patient is pending evaluation by psychiatry. Currently medically stable for same.   Emily Filbert, MD   Note: This note was generated in part or whole with voice recognition software. Voice recognition is usually quite accurate but there are transcription errors that can and very often do occur. I apologize for any typographical errors that were not detected and corrected.     Emily Filbert, MD 12/26/16 [phone]

## 2016-12-25 NOTE — ED Notes (Signed)
Dr. Clapacs talking with Patient.  

## 2016-12-25 NOTE — BH Assessment (Signed)
Patient is to be admitted to [facility] by Dr. Toni Amend.  Attending Physician will be Dr. Jennet Maduro.   Patient has been assigned to room 311-B, by [facility] Charge Nurse Lillette Boxer   ER staff is aware of the admission (Dr. Derrill Kay, ER MD; Rudy Jew., Patient's Nurse & Pura Spice, Patient Access).

## 2016-12-25 NOTE — Consult Note (Signed)
[facility] Consult   Reason for Consult:  Consult for 51 year old man who came to the emergency room claiming he was having hallucinations and suicidal thoughts Referring Physician:  Archie Balboa Patient Identification: [name] MRN:  [id number] Principal Diagnosis: Schizophrenia [facility]) Diagnosis:   Patient Active Problem List   Diagnosis Date Noted  . Schizophrenia ([place]) [F20.9] 12/25/2016  . Cocaine abuse with cocaine-induced mood disorder ([place]) [F14.14] 09/06/2016  . Alcohol use disorder, mild, abuse [F10.10] 07/31/2016  . Cocaine use disorder, severe, dependence ([place]) [F14.20] 05/31/2016  . Essential hypertension [I10] 05/31/2016    Total Time spent with patient: 1 hour  Subjective:   [name] is a 51 y.o. male patient admitted with "I want to get my life together".  HPI:  Patient interviewed. Chart reviewed. 51 year old man came to the emergency room today saying that he wanted treatment for his hallucinations and mood symptoms. He tells me that he has been "seeing voices". He won't be any more specific than that. He tells me he has thoughts about wanting to slit his wrist. He is rather vague about the time course of this but says that he feels bad all the time. Apparently he had been at [facility] in [place] and told them that he wanted to get into substance abuse treatment. They took him to our TS but he didn't like it there so he checked out of there and came to our emergency room. He says that he is supposed to be on Seroquel and trazodone but has not been taking them recently. Admits that he uses cocaine regularly and last used some just within the last day. Also admits that he drinks regularly although he says that he has not had any alcohol in about 2 days now.  Social history: He refuses to discuss any details about his social life and gets rather belligerent about it. Appears to be homeless right now.  Medical history: Reports that he has asthma  and high blood pressure.  Substance abuse history: Says he has been using cocaine regularly for years. He claims he's been in treatment in the past but it was a long time ago. Also says that he drinks regularly but denies any alcohol withdrawal symptoms. Denies other drug abuse  Past Psychiatric History: No psychiatric hospitalizations that he will admit to. Patient has frequented [facility] with similar symptoms quite a bit recently. The last time he was there they concluded that his problems were entirely drug-related and declined to admit him. Apparently he has gone to [place] and been prescribed medicines. He claims that he has tried to kill himself by overdosing in the past. Denies any history of violence.  Risk to Self: Suicidal Ideation: Yes-Currently Present Suicidal Intent: No Is patient at risk for suicide?: No Suicidal Plan?: Yes-Currently Present Specify Current Suicidal Plan: "Slit my wrist" Access to Means: No What has been your use of drugs/alcohol within the last 12 months?: Cocaine & Alcohol How many times?:  ("So many I don't know.") Other Self Harm Risks: Active addiction Triggers for Past Attempts: Unknown Intentional Self Injurious Behavior: None Risk to Others: Homicidal Ideation: No Thoughts of Harm to Others: No Current Homicidal Intent: No Current Homicidal Plan: No Access to Homicidal Means: No Identified Victim: Reports of none History of harm to others?: No Assessment of Violence: None Noted Violent Behavior Description: Reports of none Does patient have access to weapons?: No Criminal Charges Pending?: No Does patient have a court date: No Prior Inpatient Therapy: Prior Inpatient  Therapy: Yes Prior Therapy Dates: multiple times Prior Therapy Facilty/Provider(s): "I been to so many I really can't say." Reason for Treatment: SI; drug use Prior Outpatient Therapy: Prior Outpatient Therapy: Yes Prior Therapy Dates: Currently  Prior Therapy  Facilty/Provider(s): [facility], [facility]  Reason for Treatment: "I'm a mental health patient" Does patient have an ACCT team?: No Does patient have Intensive In-House Services?  : No Does patient have [facility] services? : Yes Does patient have P4CC services?: No  Past Medical History:  Past Medical History:  Diagnosis Date  . Asthma   . Bipolar 1 disorder ([place])   . Depression   . Homelessness   . Hypertension   . Schizoaffective disorder ([place])    History reviewed. No pertinent surgical history. Family History:  Family History  Problem Relation Age of Onset  . Drug abuse Mother   . Suicidality Mother    Family Psychiatric  History: He gets belligerent with me just for asking the question although then he says that his mother committed suicide. Won't tell me anything else. Social History:  History  Alcohol Use  . Yes    Comment: infrequently     History  Drug Use  . Types: Cocaine    Social History   Social History  . Marital status: Single    Spouse name: N/A  . Number of children: N/A  . Years of education: N/A   Social History Main Topics  . Smoking status: Current Every Day Smoker    Types: Cigarettes  . Smokeless tobacco: Never Used  . Alcohol use Yes     Comment: infrequently  . Drug use: Yes    Types: Cocaine  . Sexual activity: Yes    Birth control/ protection: None   Other Topics Concern  . None   Social History Narrative  . None   Additional Social History:    Allergies:   Allergies  Allergen Reactions  . Tylenol [Acetaminophen] Nausea And Vomiting    Labs:  Results for orders placed or performed during the hospital encounter of 12/25/16 (from the past 48 hour(s))  CBC with Differential     Status: Abnormal   Collection Time: 12/25/16  2:45 PM  Result Value Ref Range   WBC 4.9 3.8 - 10.6 K/uL   RBC 4.28 (L) 4.40 - 5.90 MIL/uL   Hemoglobin 14.2 13.0 - 18.0 g/dL   HCT 42.5 40.0 - 52.0 %   MCV 99.2 80.0 - 100.0 fL   MCH 33.2  26.0 - 34.0 pg   MCHC 33.4 32.0 - 36.0 g/dL   RDW 14.6 (H) 11.5 - 14.5 %   Platelets 238 150 - 440 K/uL   Neutrophils Relative % 48 %   Neutro Abs 2.3 1.4 - 6.5 K/uL   Lymphocytes Relative 43 %   Lymphs Abs 2.1 1.0 - 3.6 K/uL   Monocytes Relative 8 %   Monocytes Absolute 0.4 0.2 - 1.0 K/uL   Eosinophils Relative 1 %   Eosinophils Absolute 0.1 0 - 0.7 K/uL   Basophils Relative 0 %   Basophils Absolute 0.0 0 - 0.1 K/uL  Comprehensive metabolic panel     Status: Abnormal   Collection Time: 12/25/16  2:45 PM  Result Value Ref Range   Sodium 138 135 - 145 mmol/L   Potassium 4.1 3.5 - 5.1 mmol/L   Chloride 105 101 - 111 mmol/L   CO2 28 22 - 32 mmol/L   Glucose, Bld 93 65 - 99 mg/dL   BUN  17 6 - 20 mg/dL   Creatinine, Ser 1.21 0.61 - 1.24 mg/dL   Calcium 8.6 (L) 8.9 - 10.3 mg/dL   Total Protein 6.3 (L) 6.5 - 8.1 g/dL   Albumin 3.5 3.5 - 5.0 g/dL   AST 56 (H) 15 - 41 U/L   ALT 71 (H) 17 - 63 U/L   Alkaline Phosphatase 67 38 - 126 U/L   Total Bilirubin 0.5 0.3 - 1.2 mg/dL   GFR calc non Af Amer >60 >60 mL/min   GFR calc Af Amer >60 >60 mL/min    Comment: (NOTE) The eGFR has been calculated using the CKD EPI equation. This calculation has not been validated in all clinical situations. eGFR's persistently <60 mL/min signify possible Chronic Kidney Disease.    Anion gap 5 5 - 15  Troponin I     Status: None   Collection Time: 12/25/16  2:45 PM  Result Value Ref Range   Troponin I <0.03 <0.03 ng/mL  Ethanol     Status: None   Collection Time: 12/25/16  2:45 PM  Result Value Ref Range   Alcohol, Ethyl (B) <5 <5 mg/dL    Comment:        LOWEST DETECTABLE LIMIT FOR SERUM ALCOHOL IS 5 mg/dL FOR MEDICAL PURPOSES ONLY   Urine Drug Screen, Qualitative ([facility] only)     Status: Abnormal   Collection Time: 12/25/16  3:33 PM  Result Value Ref Range   Tricyclic, Ur Screen NONE DETECTED NONE DETECTED   Amphetamines, Ur Screen NONE DETECTED NONE DETECTED   MDMA (Ecstasy)Ur Screen NONE  DETECTED NONE DETECTED   Cocaine Metabolite,Ur [place] POSITIVE (A) NONE DETECTED   Opiate, Ur Screen NONE DETECTED NONE DETECTED   Phencyclidine (PCP) Ur S NONE DETECTED NONE DETECTED   Cannabinoid 50 Ng, Ur [place] NONE DETECTED NONE DETECTED   Barbiturates, Ur Screen NONE DETECTED NONE DETECTED   Benzodiazepine, Ur Scrn POSITIVE (A) NONE DETECTED   Methadone Scn, Ur NONE DETECTED NONE DETECTED    Comment: (NOTE) 115  Tricyclics, urine               Cutoff 1000 ng/mL 200  Amphetamines, urine             Cutoff 1000 ng/mL 300  MDMA (Ecstasy), urine           Cutoff 500 ng/mL 400  Cocaine Metabolite, urine       Cutoff 300 ng/mL 500  Opiate, urine                   Cutoff 300 ng/mL 600  Phencyclidine (PCP), urine      Cutoff 25 ng/mL 700  Cannabinoid, urine              Cutoff 50 ng/mL 800  Barbiturates, urine             Cutoff 200 ng/mL 900  Benzodiazepine, urine           Cutoff 200 ng/mL 1000 Methadone, urine                Cutoff 300 ng/mL 1100 1200 The urine drug screen provides only a preliminary, unconfirmed 1300 analytical test result and should not be used for non-medical 1400 purposes. Clinical consideration and professional judgment should 1500 be applied to any positive drug screen result due to possible 1600 interfering substances. A more specific alternate chemical method 1700 must be used in order to obtain a confirmed analytical result.  1800 Gas  chromato graphy / mass spectrometry (GC/MS) is the preferred 1900 confirmatory method.     Current Facility-Administered Medications  Medication Dose Route Frequency Provider Last Rate Last Dose  . albuterol (PROVENTIL HFA;VENTOLIN HFA) 108 (90 Base) MCG/ACT inhaler 2 puff  2 puff Inhalation Q4H PRN Gonzella Lex, MD      . lisinopril (PRINIVIL,ZESTRIL) tablet 10 mg  10 mg Oral Daily Ereka Brau T Isabelly Kobler, MD      . QUEtiapine (SEROQUEL) tablet 200 mg  200 mg Oral QHS Gonzella Lex, MD      . traZODone (DESYREL) tablet 150 mg  150 mg  Oral QHS Gonzella Lex, MD       Current Outpatient Prescriptions  Medication Sig Dispense Refill  . albuterol (PROVENTIL HFA;VENTOLIN HFA) 108 (90 Base) MCG/ACT inhaler Inhale 1-2 puffs into the lungs every 4 (four) hours as needed for shortness of breath.    . benztropine (COGENTIN) 1 MG tablet Take 1 mg by mouth 2 (two) times daily.    . divalproex (DEPAKOTE ER) 500 MG 24 hr tablet Take 1 tablet (500 mg total) by mouth 2 (two) times daily. (Patient not taking: Reported on 10/29/2016) 60 tablet 0  . doxycycline (VIBRA-TABS) 100 MG tablet Take 1 tablet (100 mg total) by mouth 2 (two) times daily. (Patient not taking: Reported on 10/29/2016) 14 tablet 0  . gabapentin (NEURONTIN) 300 MG capsule Take 1 capsule (300 mg total) by mouth 3 (three) times daily. (Patient not taking: Reported on 10/29/2016) 90 capsule 0  . hydrOXYzine (ATARAX/VISTARIL) 25 MG tablet Take 1 tablet (25 mg total) by mouth every 6 (six) hours as needed for anxiety. (Patient not taking: Reported on 10/29/2016) 30 tablet 0  . lisinopril (PRINIVIL,ZESTRIL) 40 MG tablet Take 1 tablet (40 mg total) by mouth daily. 30 tablet 0  . predniSONE (DELTASONE) 20 MG tablet Take 2 tablets (40 mg total) by mouth daily. (Patient not taking: Reported on 10/29/2016) 8 tablet 0  . pregabalin (LYRICA) 50 MG capsule Take 50 mg by mouth 2 (two) times daily.    . traZODone (DESYREL) 100 MG tablet Take 1 tablet (100 mg total) by mouth at bedtime. For sleep (Patient not taking: Reported on 10/29/2016) 30 tablet 0    Musculoskeletal: Strength & Muscle Tone: within normal limits Gait & Station: normal Patient leans: N/A  Psychiatric Specialty Exam: Physical Exam  Nursing note and vitals reviewed. Constitutional: He appears well-developed and well-nourished.  HENT:  Head: Normocephalic and atraumatic.  Eyes: Conjunctivae are normal. Pupils are equal, round, and reactive to light.  Neck: Normal range of motion.  Cardiovascular: Regular rhythm and  normal heart sounds.   Respiratory: Effort normal. He has wheezes. He has rales.  GI: Soft. He exhibits no distension.  Musculoskeletal: Normal range of motion.  Neurological: He is alert.  Skin: Skin is warm and dry.  Psychiatric: His mood appears anxious. His affect is labile. His speech is tangential. He is agitated. Thought content is paranoid. He expresses impulsivity. He expresses suicidal ideation. He exhibits abnormal recent memory.    Review of Systems  Constitutional: Negative.   HENT: Negative.   Eyes: Negative.   Respiratory: Negative.   Cardiovascular: Negative.   Gastrointestinal: Negative.   Musculoskeletal: Negative.   Skin: Negative.   Neurological: Negative.   Psychiatric/Behavioral: Positive for depression, hallucinations, memory loss, substance abuse and suicidal ideas. The patient is nervous/anxious and has insomnia.     Blood pressure (!) 152/105, pulse 68, temperature 98 F (36.7 C),  temperature source Oral, resp. rate 16, height _0  (1.753 m), weight 81.6 kg (180 lb), SpO2 100 %.Body mass index is 26.58 kg/m.  General Appearance: Disheveled  Eye Contact:  Minimal  Speech:  Normal Rate  Volume:  Decreased  Mood:  Depressed and Irritable  Affect:  Labile  Thought Process:  Disorganized  Orientation:  Full (Time, Place, and Person)  Thought Content:  Tangential  Suicidal Thoughts:  Yes.  with intent/plan  Homicidal Thoughts:  No  Memory:  Immediate;   Good Recent;   Poor Remote;   Poor  Judgement:  Impaired  Insight:  Shallow  Psychomotor Activity:  Decreased  Concentration:  Concentration: Poor  Recall:  Poor  Fund of Knowledge:  Poor  Language:  Poor  Akathisia:  No  Handed:  Right  AIMS (if indicated):     Assets:  Desire for Improvement Physical Health  ADL's:  Intact  Cognition:  Impaired,  Mild  Sleep:        Treatment Plan Summary: Daily contact with patient to assess and evaluate symptoms and progress in treatment, Medication  management and Plan 51 year old man who presents with irritable behavior very demanding. Symptoms seem to vary from one interviewer to another. He won't even be clear with me about what kind of psychotic symptoms he is supposedly having. Does not act like he is obviously thought disordered. Claims that he is having thoughts of cutting his wrists. There is a good chance that under the substance abuse this is mostly antisocial. Nevertheless he will be admitted to the psychiatric unit for a diagnosis of schizophrenia and cocaine abuse. Restart Seroquel 200 mg a night continue the lisinopril and albuterol inhaler. Full set of labs obtained. 15 minute checks.  Disposition: Recommend psychiatric Inpatient admission when medically cleared.  Alethia Berthold, MD 12/25/2016 6:40 PM

## 2016-12-25 NOTE — ED Notes (Signed)
Pt refusing to tell this RN reason for coming to ED. States "Get the doctor in here, I'm not gonna tell you then him then someone else." Primary RN notified.

## 2016-12-25 NOTE — ED Triage Notes (Signed)
Pt c/o cough that he was previously seen for and given prednisone but cough has not gotten better. Also having SI/HI and auditory hallucinations. Plan to cut wrists but refuses to elaborate further. Wants detox from alcohol and cocaine; last drink 1.5 days ago.

## 2016-12-25 NOTE — ED Notes (Signed)
Admits to HI/SI and auditory hallucinations. Has had plan of wanting to cut wrists. Pt refusing to talk about anything further with RN.

## 2016-12-25 NOTE — ED Notes (Signed)
Pt is too agitated this evening to be admitted on the in patient unit. Hopefully he will have deescalated by tomorrow with medication and a good nights rest. Writer will inform staff on BMU.

## 2016-12-25 NOTE — ED Notes (Signed)
Patient given extra tray and He ate 100%.

## 2016-12-25 NOTE — ED Notes (Signed)
Pt is alert and oriented this evening. Pt mood is irritable and he is somewhat demanding and rude to staff. Pt is c/o SOB related to asthma although his O2 sats were 100% and he is in no visible distress. Writer provided inhaler which he states improved his symptoms. Pt demanding to speak with MD who did come to the [facility] and interview the patient. Will continue to administer inhaler PRN. Snack and fluids provided and 15 minute checks are ongoing for safety. B/P also improved with medication.

## 2016-12-25 NOTE — ED Notes (Signed)
Pt is refusing medications until "I see a medical doctor and they deal with my asthma thing." MD has already met with the patient and there is no further interventions indicated. Pt is obstinate and belligerent, although he has not been aggressive thus far. He finally came out of his room and agreed to take medications but stated not to speak to him until the MD "does what they are supposed to do." Writer inquired if patient would be willing to go down stairs tonight and he refused to answer, told me to stop talking to him and shut the door on writers face. Patient stated "not to ask for vital signs or nothin."

## 2016-12-25 NOTE — BH Assessment (Signed)
Assessment Note  [name] is an 51 y.o. male Who presents to the ER after he was discharged from [facility]. Patient states, he was admitted with them approximately 24 hours ago, for detox. While there, he was giving medications that worsened his withdrawal symptoms. He further states, the staff did not explain to him what he was taking but was force to take it. It resulted him getting upset and leaving.   Patient admits to having a history of abusing alcohol and cocaine. Last date of use was a few hours prior to him been admitted with [facility].  He also states, prior to been admitted to [facility], he was having thoughts of ending his life. He reports of trying to "suppress" SI but after leaving [facility] it increased. He is now having thoughts of "slitting my wrist." Patient states he has had multiple suicide attempts. The last one was approximately a year ago. He took an overdose of medications and it resulted in him been admitted to a behavioral health hospital.  During the interview, patient was calm cooperative and polite. He was able to answer question appropriately. He denies HI and AV/H. He also denies any involvement with the legal system, DSS and denies having a history of violence and aggression.   Diagnosis: Depression  Past Medical History:  Past Medical History:  Diagnosis Date  . Asthma   . Bipolar 1 disorder (HCC)   . Depression   . Homelessness   . Hypertension   . Schizoaffective disorder (HCC)     History reviewed. No pertinent surgical history.  Family History:  Family History  Problem Relation Age of Onset  . Drug abuse Mother   . Suicidality Mother     Social History:  reports that he has been smoking Cigarettes.  He has never used smokeless tobacco. He reports that he drinks alcohol. He reports that he uses drugs, including Cocaine.  Additional Social History:  Alcohol / Drug Use Pain Medications: See PTA Prescriptions: See PTA Over the Counter: See PTA History of alcohol /  drug use?: Yes Longest period of sobriety (when/how long): 6.5 years Negative Consequences of Use: Financial, Personal relationships, Work / School Withdrawal Symptoms: Sweats Substance #1 Name of Substance 1: Cocaine 1 - Age of First Use: Teenager 1 - Amount (size/oz): Unable to quantify 1 - Frequency: Unable to quantify 1 - Duration: Unable to quantify 1 - Last Use / Amount: 12/24/2016 Substance #2 Name of Substance 2: Alcohol  2 - Age of First Use: Teenager 2 - Amount (size/oz): "Until I get drunk."  2 - Frequency: Daily 2 - Duration: Unable to quantify 2 - Last Use / Amount: 12/24/2016  CIWA: CIWA-Ar BP: (!) 152/105 Pulse Rate: 68 Nausea and Vomiting: no nausea and no vomiting Tactile Disturbances: none Tremor: no tremor Auditory Disturbances: mild harshness or ability to frighten Paroxysmal Sweats: no sweat visible Visual Disturbances: not present Anxiety: two Headache, Fullness in Head: very mild Agitation: two Orientation and Clouding of Sensorium: oriented and can do serial additions CIWA-Ar Total: 7 COWS:    Allergies:  Allergies  Allergen Reactions  . Tylenol [Acetaminophen] Nausea And Vomiting    Home Medications:  (Not in a hospital admission)  OB/GYN Status:  No LMP for male patient.  General Assessment Data Location of Assessment: [facility] ED [facility]TS Assessment: In system Is this a Tele or Face-to-Face Assessment?: Face-to-Face Is this an Initial Assessment or a Re-assessment for this encounter?: Initial Assessment Marital status: Single Maiden name: n/a Is patient pregnant?:  No Pregnancy Status: No Living Arrangements: Other (Comment) (Homeless) Can pt return to current living arrangement?: Yes Admission Status: Voluntary Is patient capable of signing voluntary admission?: Yes Referral Source: Self/Family/Friend Insurance type: Medicaid  Medical Screening Exam [facility] Walk-in ONLY) Medical Exam completed: Yes  Crisis Care Plan Living Arrangements:  Other (Comment) (Homeless) Legal Guardian: Other: (Self) Name of Psychiatrist: [facility] Name of Therapist: Vesta Mixer, [facility]  Education Status Is patient currently in school?: No Current Grade: n/a Highest grade of school patient has completed: Unknown Name of school: n/a Contact person: n/a  Risk to self with the past 6 months Suicidal Ideation: Yes-Currently Present Has patient been a risk to self within the past 6 months prior to admission? : Yes Suicidal Intent: No Has patient had any suicidal intent within the past 6 months prior to admission? : No Is patient at risk for suicide?: No Suicidal Plan?: Yes-Currently Present Has patient had any suicidal plan within the past 6 months prior to admission? : Yes Specify Current Suicidal Plan: "Slit my wrist" Access to Means: No What has been your use of drugs/alcohol within the last 12 months?: Cocaine & Alcohol Previous Attempts/Gestures: Yes How many times?:  ("So many I don't know.") Other Self Harm Risks: Active addiction Triggers for Past Attempts: Unknown Intentional Self Injurious Behavior: None Family Suicide History: No Recent stressful life event(s): Other (Comment), Financial Problems, Job Loss (Active addiction) Persecutory voices/beliefs?: No Depression: Yes Depression Symptoms: Feeling worthless/self pity, Loss of interest in usual pleasures, Guilt, Fatigue, Isolating Substance abuse history and/or treatment for substance abuse?: Yes Suicide prevention information given to non-admitted patients: Not applicable  Risk to Others within the past 6 months Homicidal Ideation: No Does patient have any lifetime risk of violence toward others beyond the six months prior to admission? : No Thoughts of Harm to Others: No Current Homicidal Intent: No Current Homicidal Plan: No Access to Homicidal Means: No Identified Victim: Reports of none History of harm to others?: No Assessment of Violence:  None Noted Violent Behavior Description: Reports of none Does patient have access to weapons?: No Criminal Charges Pending?: No Does patient have a court date: No Is patient on probation?: No  Psychosis Hallucinations: None noted Delusions: None noted  Mental Status Report Appearance/Hygiene: In scrubs, In hospital gown, Unremarkable Eye Contact: Fair Motor Activity: Freedom of movement, Unremarkable Speech: Logical/coherent, Unremarkable Level of Consciousness: Alert Mood: Anxious, Depressed, Helpless, Sad, Pleasant Affect: Appropriate to circumstance, Depressed, Sad Anxiety Level: Minimal Thought Processes: Coherent, Relevant Judgement: Unimpaired Orientation: Person, Place, Time, Situation, Appropriate for developmental age Obsessive Compulsive Thoughts/Behaviors: Minimal  Cognitive Functioning Concentration: Normal Memory: Recent Intact, Remote Intact IQ: Average Insight: Fair Impulse Control: Fair Appetite: Good Weight Loss: 60 (Within the last 6 months) Weight Gain: 0 Sleep: No Change Total Hours of Sleep: 8 Vegetative Symptoms: None  ADLScreening [facility] Assessment Services) Patient's cognitive ability adequate to safely complete daily activities?: Yes Patient able to express need for assistance with ADLs?: Yes Independently performs ADLs?: Yes (appropriate for developmental age)  Prior Inpatient Therapy Prior Inpatient Therapy: Yes Prior Therapy Dates: multiple times Prior Therapy Facilty/Provider(s): "I been to so many I really can't say." Reason for Treatment: SI; drug use  Prior Outpatient Therapy Prior Outpatient Therapy: Yes Prior Therapy Dates: Currently  Prior Therapy Facilty/Provider(s): [facility], [facility]  Reason for Treatment: "I'm a mental health patient" Does patient have an ACCT team?: No Does patient have Intensive In-House Services?  : No Does patient have [place] services? :  Yes Does patient have P4CC services?: No  ADL Screening  (condition at time of admission) Patient's cognitive ability adequate to safely complete daily activities?: Yes Is the patient deaf or have difficulty hearing?: No Does the patient have difficulty seeing, even when wearing glasses/contacts?: No Does the patient have difficulty concentrating, remembering, or making decisions?: No Patient able to express need for assistance with ADLs?: Yes Does the patient have difficulty dressing or bathing?: No Independently performs ADLs?: Yes (appropriate for developmental age) Does the patient have difficulty walking or climbing stairs?: No Weakness of Legs: None Weakness of Arms/Hands: None  Home Assistive Devices/Equipment Home Assistive Devices/Equipment: None  Therapy Consults (therapy consults require a physician order) PT Evaluation Needed: No OT Evalulation Needed: No SLP Evaluation Needed: No Abuse/Neglect Assessment (Assessment to be complete while patient is alone) Physical Abuse: Denies Verbal Abuse: Denies Sexual Abuse: Denies Exploitation of patient/patient's resources: Denies Self-Neglect: Denies Values / Beliefs Cultural Requests During Hospitalization: None Spiritual Requests During Hospitalization: None Consults Spiritual Care Consult Needed: No Social Work Consult Needed: No      Additional Information 1:1 In Past 12 Months?: No CIRT Risk: No Elopement Risk: No Does patient have medical clearance?: Yes  Child/Adolescent Assessment Running Away Risk: Denies (Patient is an adult)  Disposition:  Disposition Initial Assessment Completed for this Encounter: Yes Disposition of Patient: Other dispositions (ER MD ordered Psych Consult)  On Site Evaluation by:   Reviewed with Physician:    Lilyan Gilford MS, LCAS, LPC, NCC, CCSI Therapeutic Triage Specialist 12/25/2016 5:55 PM

## 2016-12-25 NOTE — ED Provider Notes (Signed)
-----------------------------------------   8:53 PM on 12/25/2016 -----------------------------------------  I evaluated the patient at his request because of some concerns for breathing. Patient did have some very mild wheezing on exam however was in no distress. Patient is currently getting albuterol inhaler treatments when necessary. I feel like this is appropriate at this time. Certainly no focal auscultation findings raise concern for pneumonia. Additionally patient did not have any leukocytosis on blood work.   Phineas Semen, MD 12/25/16 [phone]

## 2016-12-25 NOTE — ED Notes (Signed)
Nurse tried to talk with Patient and He states " I have talked with the doctor , you can read the notes from him, I don't want to talk anymore. Patient rude and easily agitated when nurse attempted to talk to him.Patient left alone and  He is watching tv at this time, patient is safe, q 15 minutes checks and camera monitoring in progress.

## 2016-12-26 ENCOUNTER — Inpatient Hospital Stay
Admission: EM | Admit: 2016-12-26 | Discharge: 2016-12-29 | DRG: 885 | Disposition: A | Discharge status: Home / Self Care | Payer: Medicaid Other | Source: Intra-hospital | Attending: Psychiatry | Admitting: Psychiatry

## 2016-12-26 DIAGNOSIS — Z888 Allergy status to other drugs, medicaments and biological substances status: Secondary | ICD-10-CM | POA: Diagnosis not present

## 2016-12-26 DIAGNOSIS — F1420 Cocaine dependence, uncomplicated: Secondary | ICD-10-CM | POA: Diagnosis present

## 2016-12-26 DIAGNOSIS — J45909 Unspecified asthma, uncomplicated: Secondary | ICD-10-CM | POA: Diagnosis present

## 2016-12-26 DIAGNOSIS — G4700 Insomnia, unspecified: Secondary | ICD-10-CM | POA: Diagnosis present

## 2016-12-26 DIAGNOSIS — I10 Essential (primary) hypertension: Secondary | ICD-10-CM | POA: Diagnosis present

## 2016-12-26 DIAGNOSIS — R45851 Suicidal ideations: Secondary | ICD-10-CM | POA: Diagnosis present

## 2016-12-26 DIAGNOSIS — F1994 Other psychoactive substance use, unspecified with psychoactive substance-induced mood disorder: Secondary | ICD-10-CM | POA: Diagnosis present

## 2016-12-26 DIAGNOSIS — Z79899 Other long term (current) drug therapy: Secondary | ICD-10-CM

## 2016-12-26 DIAGNOSIS — R0602 Shortness of breath: Secondary | ICD-10-CM

## 2016-12-26 DIAGNOSIS — F17210 Nicotine dependence, cigarettes, uncomplicated: Secondary | ICD-10-CM | POA: Diagnosis present

## 2016-12-26 DIAGNOSIS — F209 Schizophrenia, unspecified: Secondary | ICD-10-CM | POA: Insufficient documentation

## 2016-12-26 DIAGNOSIS — F17200 Nicotine dependence, unspecified, uncomplicated: Secondary | ICD-10-CM | POA: Diagnosis present

## 2016-12-26 DIAGNOSIS — F250 Schizoaffective disorder, bipolar type: Secondary | ICD-10-CM | POA: Diagnosis present

## 2016-12-26 DIAGNOSIS — F200 Paranoid schizophrenia: Secondary | ICD-10-CM | POA: Diagnosis not present

## 2016-12-26 LAB — LIPID PANEL
CHOLESTEROL: 172 mg/dL (ref 0–200)
HDL: 88 mg/dL (ref >40)
LDL CALC: 73 mg/dL (ref 0–99)
TRIGLYCERIDES: 53 mg/dL (ref <150)
Total CHOL/HDL Ratio: 2 RATIO
VLDL: 11 mg/dL (ref 0–40)

## 2016-12-26 LAB — TSH: TSH: 3.116 u[IU]/mL (ref 0.350–4.500)

## 2016-12-26 MED ORDER — ONDANSETRON HCL 4 MG/2ML IJ SOLN: 4.0000 mg | Freq: Once | INTRAMUSCULAR | Status: DC

## 2016-12-26 MED ORDER — TRAZODONE HCL 100 MG PO TABS
150.0000 mg | ORAL_TABLET | Freq: Every day | ORAL | Status: DC
  Administered 2016-12-28: 150 mg via ORAL
  Filled 2016-12-26 (×2): qty 1

## 2016-12-26 MED ORDER — GUAIFENESIN ER 600 MG PO TB12
600.0000 mg | ORAL_TABLET | Freq: Once | ORAL | Status: DC
  Administered 2016-12-26: 600 mg via ORAL
  Filled 2016-12-26: qty 1

## 2016-12-26 MED ORDER — IBUPROFEN 800 MG PO TABS
800.0000 mg | ORAL_TABLET | Freq: Once | ORAL | Status: AC
  Administered 2016-12-26: 800 mg via ORAL

## 2016-12-26 MED ORDER — QUETIAPINE FUMARATE 200 MG PO TABS
200.0000 mg | ORAL_TABLET | Freq: Every day | ORAL | Status: DC
  Administered 2016-12-27 – 2016-12-28 (×2): 200 mg via ORAL
  Filled 2016-12-26 (×3): qty 1

## 2016-12-26 MED ORDER — LISINOPRIL 10 MG PO TABS
10.0000 mg | ORAL_TABLET | Freq: Every day | ORAL | Status: DC
  Administered 2016-12-26: 10 mg via ORAL
  Filled 2016-12-26: qty 1

## 2016-12-26 MED ORDER — LISINOPRIL 10 MG PO TABS
40.0000 mg | ORAL_TABLET | Freq: Every day | ORAL | Status: DC
  Administered 2016-12-27 – 2016-12-28 (×2): 40 mg via ORAL
  Filled 2016-12-26 (×3): qty 4

## 2016-12-26 MED ORDER — FAMOTIDINE IN NACL 20-0.9 MG/50ML-% IV SOLN: 20.0000 mg | Freq: Once | INTRAVENOUS | Status: DC

## 2016-12-26 MED ORDER — ACETAMINOPHEN 325 MG PO TABS: 650.0000 mg | ORAL_TABLET | Freq: Four times a day (QID) | ORAL | Status: DC | PRN

## 2016-12-26 MED ORDER — MORPHINE SULFATE (PF) 4 MG/ML IV SOLN: 4.0000 mg | Freq: Once | INTRAVENOUS | Status: DC

## 2016-12-26 MED ORDER — MAGNESIUM HYDROXIDE 400 MG/5ML PO SUSP: 30.0000 mL | Freq: Every day | ORAL | Status: DC | PRN

## 2016-12-26 MED ORDER — DM-GUAIFENESIN ER 30-600 MG PO TB12: 1.0000 | ORAL_TABLET | Freq: Once | ORAL | Status: DC

## 2016-12-26 MED ORDER — SODIUM CHLORIDE 0.9 % IV BOLUS (SEPSIS): 1000.0000 mL | Freq: Once | INTRAVENOUS | Status: DC

## 2016-12-26 MED ORDER — DEXTROMETHORPHAN POLISTIREX ER 30 MG/5ML PO SUER
30.0000 mg | Freq: Once | ORAL | Status: DC
  Administered 2016-12-26: 30 mg via ORAL
  Filled 2016-12-26 (×2): qty 5

## 2016-12-26 MED ORDER — IBUPROFEN 800 MG PO TABS
800.0000 mg | ORAL_TABLET | Freq: Three times a day (TID) | ORAL | Status: DC | PRN
  Administered 2016-12-26 – 2016-12-28 (×4): 800 mg via ORAL
  Filled 2016-12-26 (×5): qty 1

## 2016-12-26 MED ORDER — NICOTINE 21 MG/24HR TD PT24
21.0000 mg | MEDICATED_PATCH | Freq: Every day | TRANSDERMAL | Status: DC
  Filled 2016-12-26 (×2): qty 1

## 2016-12-26 MED ORDER — ALUM & MAG HYDROXIDE-SIMETH 200-200-20 MG/5ML PO SUSP: 30.0000 mL | ORAL | Status: DC | PRN

## 2016-12-26 MED ORDER — ALBUTEROL SULFATE HFA 108 (90 BASE) MCG/ACT IN AERS
2.0000 | INHALATION_SPRAY | RESPIRATORY_TRACT | Status: DC | PRN
  Administered 2016-12-27 – 2016-12-28 (×2): 2 via RESPIRATORY_TRACT
  Filled 2016-12-26 (×3): qty 6.7

## 2016-12-26 MED ORDER — IBUPROFEN 800 MG PO TABS
ORAL_TABLET | ORAL | Status: AC
  Filled 2016-12-26: qty 1

## 2016-12-26 NOTE — [facility] Suicide Risk Assessment (Signed)
[facility] INPATIENT:  Family/Significant Other Suicide Prevention Education  Suicide Prevention Education:  Patient Refusal for Family/Significant Other Suicide Prevention Education: The patient [name] has refused to provide written consent for family/significant other to be provided Family/Significant Other Suicide Prevention Education during admission and/or prior to discharge.  Physician notified.  Lorri Frederick, [name] 12/26/2016, 2:56 PM

## 2016-12-26 NOTE — [facility] Group Notes (Signed)
[facility] [name] Group Therapy Note  Date/Time: 12/26/16 1300  Type of Therapy and Topic:  Group Therapy:  Feelings around Relapse and Recovery  Participation Level:  Did Not Attend (remains to same)  Mood:  Description of Group:    Patients in this group will discuss emotions they experience before and after a relapse. They will process how experiencing these feelings, or avoidance of experiencing them, relates to having a relapse. Facilitator will guide patients to explore emotions they have related to recovery. Patients will be encouraged to process which emotions are more powerful. They will be guided to discuss the emotional reaction significant others in their lives may have to patients' relapse or recovery. Patients will be assisted in exploring ways to respond to the emotions of others without this contributing to a relapse.  Therapeutic Goals: 1. Patient will identify two or more emotions that lead to relapse for them:  2. Patient will identify two emotions that result when they relapse:  3. Patient will identify two emotions related to recovery:  4. Patient will demonstrate ability to communicate their needs through discussion and/or role plays.   Summary of Patient Progress:        Therapeutic Modalities:   Cognitive Behavioral Therapy Solution-Focused Therapy Assertiveness Training Relapse Prevention Therapy    Greg Mike Berntsen, [name]   

## 2016-12-26 NOTE — Progress Notes (Signed)
Pt refused ECG this morning.

## 2016-12-26 NOTE — Progress Notes (Signed)
Patient ID: [name], male   DOB: 04-30-1966, 51 y.o.   MRN: [id number]  CSW scheduled intake for substance use treatment at the [facility] residential program in [name]. Intake is scheduled for 01/01/2017 at 11:45am. Patient will need at least 30 day supply of medications.   Yohance Hathorne G. Garnette Czech MSW, [facility] 12/26/2016 11:39 AM

## 2016-12-26 NOTE — Progress Notes (Signed)
[facility] MD Progress Note  12/26/2016 8:25 PM [name]  MRN:  [id number] Subjective:    [name] is a 51 year old male with a history of cocaine dependence and self-reported diagnosis of schizophrenia.   "They gave me the wrong medication."  The patient reports a long history of mental illness. He has not been taking any medications recently. He was staying at the [facility] in [place]. He reports that he has been using alcohol and "drugs". He asked his case worker to take him to a detox facility. His caseworker drove [facility] to [facility] in [place]. He left [facility] after 1 day as he believes that he was given the wrong medication. He does not believe the name of the medicine but complains that he felt dizzy and had slurred speech. He no longer is dizzy or has slurred speech. He was positive for benzodiazepine on admission but no alcohol. I suspect that he reported heavy alcohol use and was started on benzodiazepine detox. It appears that he did not need detox and was over medicated with benzodiazepines. He tells me that his last drink was 2 days ago but is very unclear about. He is very easily agitated during the interview. He yells and screams at me. He refuses to answer basic questions. He believes that his Medicaid will pay for residential substance abuse treatment program anywhere in [place]. Apparently he has not tried to get into residential treatment available in [place]. The patient came to our emergency room last night complaining of suicidal ideation with a plan to cut his wrist and off auditory hallucinations. He was positive for cocaine on admission. He reports all symptoms of depression and psychosis. He reports drinking and drug use but is not specific.  1/27 at the complaints today. He is very irritable. Patient says that he has shortness of breath, has been coughing and bringing up phlegm. He feels very congested. He has been using they have a but feels that is not helping.  Patient tells me he is angry, restless says that he didn't sleep at all last night. He feels very depressed. States that he is having thoughts of suicide and specifically thoughts of cutting his wrists however not here in the hospital. He is very angry at nurses as he feel that they don't care about his breathing issues   Per nursing: Patient stayed in the hallway, pacing, talking to self, irritable  Not back in bed sleeping. Safety precautions reinforced. 0200: Patient currently out of bed, pacing, anxious, angry. Refusing medications. Safety precautions reinforced.  Principal Problem: Schizoaffective disorder, bipolar type (HCC) Diagnosis:   Patient Active Problem List   Diagnosis Date Noted  . Asthma [J45.909] 12/26/2016  . Tobacco use disorder [F17.200] 12/26/2016  . Schizophrenia (HCC) [F20.9] 12/26/2016  . Cocaine abuse with cocaine-induced mood disorder (HCC) [F14.14] 09/06/2016  . Alcohol use disorder, mild, abuse [F10.10] 07/31/2016  . Substance induced mood disorder (HCC) [F19.94] 07/30/2016  . Schizoaffective disorder, bipolar type (HCC) [F25.0] 05/31/2016  . Cocaine use disorder, severe, dependence (HCC) [F14.20] 05/31/2016  . Essential hypertension [I10] 05/31/2016   Total Time spent with patient: 30 minutes  Past Psychiatric History: He reports that he was diagnosed schizophrenia. In Care Everywhere I can see that he was hospitalized in the past and treated with Depakote and Seroquel. He reports that he participated in residential substance abuse treatment program many years ago.  Past Medical History:  Past Medical History:  Diagnosis Date  . Asthma   . Bipolar 1 disorder (  HCC)   . Depression   . Homelessness   . Hypertension   . Schizoaffective disorder (HCC)    History reviewed. No pertinent surgical history.  Family History:  Family History  Problem Relation Age of Onset  . Drug abuse Mother   . Suicidality Mother    Family Psychiatric  History:  Unknown.  Social History: The patient states that the [facility] and reportedly can return. It is unclear how he supports himself. He has Medicaid. It is possible that he receives disability. He will not discuss it with me. It is quite possible that the patient out of money using cocaine and has been embellishing.  History  Alcohol Use  . Yes    Comment: infrequently     History  Drug Use  . Types: Cocaine    Social History   Social History  . Marital status: Single    Spouse name: N/A  . Number of children: N/A  . Years of education: N/A   Social History Main Topics  . Smoking status: Current Every Day Smoker    Types: Cigarettes  . Smokeless tobacco: Never Used  . Alcohol use Yes     Comment: infrequently  . Drug use: Yes    Types: Cocaine  . Sexual activity: Yes    Birth control/ protection: None   Other Topics Concern  . None   Social History Narrative  . None   Additional Social History:                        Current Medications: Current Facility-Administered Medications  Medication Dose Route Frequency Provider Last Rate Last Dose  . albuterol (PROVENTIL HFA;VENTOLIN HFA) 108 (90 Base) MCG/ACT inhaler 2 puff  2 puff Inhalation Q4H PRN Audery Amel, MD      . alum & mag hydroxide-simeth (MAALOX/MYLANTA) 200-200-20 MG/5ML suspension 30 mL  30 mL Oral Q4H PRN Audery Amel, MD      . ibuprofen (ADVIL,MOTRIN) tablet 800 mg  800 mg Oral Q8H PRN Jolanta B Pucilowska, MD   800 mg at 12/26/16 1440  . [START ON 12/27/2016] lisinopril (PRINIVIL,ZESTRIL) tablet 40 mg  40 mg Oral Daily Jolanta B Pucilowska, MD      . magnesium hydroxide (MILK OF MAGNESIA) suspension 30 mL  30 mL Oral Daily PRN Audery Amel, MD      . nicotine (NICODERM CQ - dosed in mg/24 hours) patch 21 mg  21 mg Transdermal Daily Jolanta B Pucilowska, MD      . QUEtiapine (SEROQUEL) tablet 200 mg  200 mg Oral QHS Audery Amel, MD      . traZODone (DESYREL) tablet 150 mg  150 mg Oral  QHS Audery Amel, MD        Lab Results:  Results for orders placed or performed during the hospital encounter of 12/26/16 (from the past 48 hour(s))  Lipid panel     Status: None   Collection Time: 12/26/16  6:55 AM  Result Value Ref Range   Cholesterol 172 0 - 200 mg/dL   Triglycerides 53 <622 mg/dL   HDL 88 >29 mg/dL   Total CHOL/HDL Ratio 2.0 RATIO   VLDL 11 0 - 40 mg/dL   LDL Cholesterol 73 0 - 99 mg/dL    Comment:        Total Cholesterol/HDL:CHD Risk Coronary Heart Disease Risk Table  Men   Women  1/2 Average Risk   3.4   3.3  Average Risk       5.0   4.4  2 X Average Risk   9.6   7.1  3 X Average Risk  23.4   11.0        Use the calculated Patient Ratio above and the CHD Risk Table to determine the patient's CHD Risk.        ATP III CLASSIFICATION (LDL):  <100     mg/dL   Optimal  161-096  mg/dL   Near or Above                    Optimal  130-159  mg/dL   Borderline  045-409  mg/dL   High  >811     mg/dL   Very High   TSH     Status: None   Collection Time: 12/26/16  6:55 AM  Result Value Ref Range   TSH 3.116 0.350 - 4.500 uIU/mL    Comment: Performed by a 3rd Generation assay with a functional sensitivity of <=0.01 uIU/mL.    Blood Alcohol level:  Lab Results  Component Value Date   ETH <5 12/25/2016   ETH <5 11/27/2016    Metabolic Disorder Labs: Lab Results  Component Value Date   HGBA1C 5.7 (H) 06/02/2016   MPG 117 06/02/2016   Lab Results  Component Value Date   PROLACTIN 9.6 06/02/2016   Lab Results  Component Value Date   CHOL 172 12/26/2016   TRIG 53 12/26/2016   HDL 88 12/26/2016   CHOLHDL 2.0 12/26/2016   VLDL 11 12/26/2016   LDLCALC 73 12/26/2016   LDLCALC 81 06/02/2016    Physical Findings: AIMS: Facial and Oral Movements Muscles of Facial Expression: None, normal Lips and Perioral Area: None, normal Jaw: None, normal Tongue: None, normal,Extremity Movements Upper (arms, wrists, hands, fingers):  None, normal Lower (legs, knees, ankles, toes): None, normal, Trunk Movements Neck, shoulders, hips: None, normal, Overall Severity Severity of abnormal movements (highest score from questions above): None, normal Incapacitation due to abnormal movements: None, normal Patient's awareness of abnormal movements (rate only patient's report): No Awareness, Dental Status Current problems with teeth and/or dentures?: No Does patient usually wear dentures?: No  CIWA:  CIWA-Ar Total: 0 COWS:  COWS Total Score: 0  Musculoskeletal: Strength & Muscle Tone: within normal limits Gait & Station: normal Patient leans: N/A  Psychiatric Specialty Exam: Physical Exam  Constitutional: He is oriented to person, place, and time. He appears well-developed and well-nourished.  HENT:  Head: Normocephalic and atraumatic.  Eyes: EOM are normal.  Neck: Normal range of motion.  Respiratory: Effort normal.  Musculoskeletal: Normal range of motion.  Neurological: He is alert and oriented to person, place, and time.    Review of Systems  Constitutional: Negative.   HENT: Negative.   Eyes: Negative.   Respiratory: Negative.   Cardiovascular: Negative.   Gastrointestinal: Negative.   Genitourinary: Negative.   Musculoskeletal: Negative.   Skin: Negative.   Neurological: Negative.   Endo/Heme/Allergies: Negative.   Psychiatric/Behavioral: Negative.     Blood pressure (!) 160/104, pulse 65, resp. rate 20.There is no height or weight on file to calculate BMI.  General Appearance: Fairly Groomed  Eye Contact:  Minimal  Speech:  Clear and Coherent  Volume:  Normal  Mood:  Irritable  Affect:  Constricted  Thought Process:  Linear and Descriptions of Associations: Intact  Orientation:  Full (  Time, Place, and Person)  Thought Content:  Hallucinations: None  Suicidal Thoughts:  Yes.  with intent/plan  Homicidal Thoughts:  No  Memory:  Immediate;   Fair Recent;   Fair Remote;   Fair  Judgement:  Poor   Insight:  Shallow  Psychomotor Activity:  Decreased  Concentration:  Concentration: Poor and Attention Span: Poor  Recall:  Poor  Fund of Knowledge:  Poor  Language:  Fair  Akathisia:  No  Handed:    AIMS (if indicated):     Assets:  Communication Skills  ADL's:  Intact  Cognition:  WNL  Sleep:        Treatment Plan Summary: [name] is a 51 year old male with history of cocaine dependence who came to the hospital from [facility] complaining of suicidal ideation with a plan to cut himself and auditory hallucinations.  1. Suicidal ideation. The patient is able to contract for safety in the hospital.  2. Mood. He was restarted on Seroquel for mood stabilization.  3. Substance abuse. The patient complains of alcohol and "drug abuse". He is no more specific. His last drink was apparently 2 days ago. Blood alcohol level on admission less than 5. Vital signs stable. I do not believe this patient needs alcohol detox. We'll continue to monitor.  4. Substance abuse treatment. The patient desires residential treatment and believes that his Medicaid would pay for anywhere in [place].  5. Asthma. He is on inhaler.  6. Hypertension. He is on lisinopril. Blood pressure is elevate. I see that he was taking 40 mg of Lisinopril in the past.  7. Smoking. Nicotine patch is available.  8. Metabolic syndrome monitoring. Lipid panel and TSH are normal. Hemoglobin A1c pending.  9. EKG. Pending.  10. Insomnia. Trazodone is available.   11. Disposition. To be established.  possibly return to the [facility]. He has a IT trainer in [place]. He follows up with [facility].  1/27 I have ordered a respiratory consult for nebulization. Patient completed a checks x-ray which is negative. I have ordered also Robitussin and Mucinex.  Patient requested Lotrimin for his feet.   Jimmy Footman, MD 12/26/2016, 8:25 PM

## 2016-12-26 NOTE — Progress Notes (Signed)
Recreation Therapy Notes  Date: 01.26.18 Time: 9:30 am Location: Craft Room  Group Topic: Coping Skills  Goal Area(s) Addresses:  Patient will participate in healthy coping skill. Patient will verbalize benefit of using art as a coping skill.  Behavioral Response: Did not attend  Intervention: Coloring  Activity: Patients were given coloring sheets and were instructed to think about the emotions they were feeling and what their minds were focused on.  Education: LRT educated patients on healthy coping skills.  Education Outcome: Patient did not attend group.  Clinical Observations/Feedback: Patient did not attend group.  Jacquelynn Cree, LRT/CTRS 12/26/2016 10:23 AM

## 2016-12-26 NOTE — ED Provider Notes (Signed)
-----------------------------------------   2:39 AM on 12/26/2016 -----------------------------------------  Patient refuses to sign voluntary consent for treatment to be admitted downstairs. Demanding that his lungs be cleared of asthma. Dissatisfied with albuterol inhaler. Agreeable to expectorant.  ----------------------------------------- 7:19 AM on 12/26/2016 -----------------------------------------  Patient subsequently consented to voluntary treatment, signed consent form and was admitted to the behavioral health unit.   Irean Hong, MD 12/26/16 [phone]

## 2016-12-26 NOTE — Progress Notes (Signed)
[facility] responded to OR for this Pt. [place] met with Pt, who expressed struggle with suicidal thoughts because he is idle. 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 Pt also bused drugs, struggles with anger, and had episodes of hallucinations that has affects his life in a negative way. Though Pt expressed a desire to overcome his behavioral challenges, he had no plan on how to do it. Pt appeared delusional but coherent. Pt wants to have treatment for anxiety, which he claims is the primary trigger of hallucinations; Pt  struggles with building relationship strangers.  [place] concluded the visit with prayer for the Pt.   12/26/16 1400  Clinical Encounter Type  Visited With Patient  Visit Type Initial;Spiritual support;Behavioral Health  Referral From Nurse  Consult/Referral To Chaplain  Spiritual Encounters  Spiritual Needs Emotional;Other (Comment)  Stress Factors  Patient Stress Factors Financial concerns;Other (Comment)  Family Stress Factors Other (Comment)

## 2016-12-26 NOTE — BHH Suicide Risk Assessment (Signed)
[facility] Admission Suicide Risk Assessment   Nursing information obtained from:    Demographic factors:    Current Mental Status:    Loss Factors:    Historical Factors:    Risk Reduction Factors:     Total Time spent with patient: 1 hour Principal Problem: <principal problem not specified> Diagnosis:   Patient Active Problem List   Diagnosis Date Noted  . Asthma [J45.909] 12/26/2016  . Tobacco use disorder [F17.200] 12/26/2016  . Schizophrenia (HCC) [F20.9] 12/26/2016  . Cocaine abuse with cocaine-induced mood disorder (HCC) [F14.14] 09/06/2016  . Alcohol use disorder, mild, abuse [F10.10] 07/31/2016  . Substance induced mood disorder (HCC) [F19.94] 07/30/2016  . Schizoaffective disorder, bipolar type (HCC) [F25.0] 05/31/2016  . Cocaine use disorder, severe, dependence (HCC) [F14.20] 05/31/2016  . Essential hypertension [I10] 05/31/2016   Subjective Data: cocaine abuse, suicidal ideation, hallucinations.  Continued Clinical Symptoms:    The "Alcohol Use Disorders Identification Test", Guidelines for Use in Primary Care, Second Edition.  World Science writer [facility]). Score between 0-7:  no or low risk or alcohol related problems. Score between 8-15:  moderate risk of alcohol related problems. Score between 16-19:  high risk of alcohol related problems. Score 20 or above:  warrants further diagnostic evaluation for alcohol dependence and treatment.   CLINICAL FACTORS:   Severe Anxiety and/or Agitation Bipolar Disorder:   Mixed State Alcohol/Substance Abuse/Dependencies   Musculoskeletal: Strength & Muscle Tone: within normal limits Gait & Station: normal Patient leans: N/A  Psychiatric Specialty Exam: Physical Exam  Nursing note and vitals reviewed. Psychiatric: His affect is angry and inappropriate. His speech is rapid and/or pressured. He is agitated and actively hallucinating. Cognition and memory are normal. He expresses impulsivity and inappropriate judgment. He  expresses suicidal ideation. He expresses suicidal plans.    Review of Systems  Psychiatric/Behavioral: Positive for depression, hallucinations, substance abuse and suicidal ideas.  All other systems reviewed and are negative.   Blood pressure (!) 160/104, pulse 65, resp. rate 20.There is no height or weight on file to calculate BMI.  General Appearance: Disheveled  Eye Contact:  Good  Speech:  Pressured  Volume:  Increased  Mood:  Angry, Dysphoric and Irritable  Affect:  Congruent  Thought Process:  Goal Directed and Descriptions of Associations: Intact  Orientation:  Full (Time, Place, and Person)  Thought Content:  Hallucinations: Auditory  Suicidal Thoughts:  Yes.  with intent/plan  Homicidal Thoughts:  No  Memory:  Immediate;   Fair Recent;   Fair Remote;   Fair  Judgement:  Poor  Insight:  Lacking  Psychomotor Activity:  Increased  Concentration:  Concentration: Fair and Attention Span: Fair  Recall:  [employer] of Knowledge:  Fair  Language:  Fair  Akathisia:  No  Handed:  Right  AIMS (if indicated):     Assets:  Communication Skills Desire for Improvement Financial Resources/Insurance Physical Health Resilience  ADL's:  Intact  Cognition:  WNL  Sleep:         COGNITIVE FEATURES THAT CONTRIBUTE TO RISK:  None    SUICIDE RISK:   Minimal: No identifiable suicidal ideation.  Patients presenting with no risk factors but with morbid ruminations; may be classified as minimal risk based on the severity of the depressive symptoms  PLAN OF CARE: Hospital admission, medication management, substance abuse counseling, discharge planning.  [name] is a 51 year old male with history of cocaine dependence who came to the hospital from [facility] complaining of suicidal ideation with a plan to  cut himself and auditory hallucinations.  1. Suicidal ideation. The patient is able to contract for safety in the hospital.  2. Mood. He was restarted on Seroquel for mood  stabilization.  3. Substance abuse. The patient complains of alcohol and "drug abuse". He is no more specific. His last drink was apparently 2 days ago. Blood alcohol level on admission less than 5. Vital signs stable. I do not believe this patient needs alcohol detox. We'll continue to monitor.  4. Substance abuse treatment. The patient desires residential treatment and believes that his Medicaid would pay for anywhere in [place].  5. Asthma. He is on inhaler.  6. Hypertension. He is on lisinopril.  7. Smoking. Nicotine patch is available.  8. Metabolic syndrome monitoring. Lipid panel, TSH and hemoglobin A1c are pending.  9. EKG.  10. Disposition. To be established. He has a IT trainer in [place]. He follows up with [facility].  I certify that inpatient services furnished can reasonably be expected to improve the patient's condition.   Kristine Linea, MD 12/26/2016, 9:33 AM

## 2016-12-26 NOTE — BHH Counselor (Signed)
Adult Comprehensive Assessment  Patient ID: [name], male   DOB: [birth year], 51 y.o.   MRN: [id number]  Information Source: Information source: Patient  Current Stressors:  Educational / Learning stressors: none reported Employment / Job issues: none reported Family Relationships: none reported Surveyor, quantity / Lack of resources (include bankruptcy): none reported Housing / Lack of housing: pt has been staying at [facility] in [place]. Physical health (include injuries & life threatening diseases): none reported Social relationships: none reported Substance abuse: pt reports active substance use Bereavement / Loss: none reported  Living/Environment/Situation:  Living Arrangements: Other (Comment) (shelter) Living conditions (as described by patient or guardian): refused to decribe How long has patient lived in current situation?: pt stated 6 months What is atmosphere in current home:  (pt would not describe)  Family History:  Marital status: Single Does patient have children?: Yes How many children?: 4 How is patient's relationship with their children?: pt refused to comment on relationship with children.  Childhood History:  By whom was/is the patient raised?:  ("Children's home") Additional childhood history information: Pt refused to provide any information stating it was "personal" Does patient have siblings?: Yes Number of Siblings: 4 Description of patient's current relationship with siblings: pt refused to comment Did patient suffer any verbal/emotional/physical/sexual abuse as a child?:  (pt refused to provide any information regarding past trauma)  Education:  Highest grade of school patient has completed: pt reports he graduated from high school Currently a student?: No  Employment/Work Situation:   Employment situation: On disability Why is patient on disability: would not say-mental health, per record How long has patient been on disability: 10+  years Has patient ever been in the [employer]?: No Are There Guns or Other Weapons in Your Home?: No  Financial Resources:   Financial resources: Receives SSI  Alcohol/Substance Abuse:   What has been your use of drugs/alcohol within the last 12 months?: cocaine, 3-4x per week 30-40 lines per use.  for past 10 years.  alcohol: last drink 2 days ago, denies current withdrawals, daily drinking for 10 plus years, refused to specify amount: "a lot" Alcohol/Substance Abuse Treatment Hx: Past Tx, Inpatient, Past detox, Attends AA/NA If yes, describe treatment: [facility], one day at [facility] before current admission Has alcohol/substance abuse ever caused legal problems?:  (refused to answer)  Social Support System:   Describe Community Support System: "God"  no other support. Type of faith/religion: "I believe in God"  Leisure/Recreation:   Leisure and Hobbies: TV, pool  Strengths/Needs:   What things does the patient do well?: I don't know. In what areas does patient struggle / problems for patient: conversation  Discharge Plan:   Does patient have access to transportation?: No Plan for no access to transportation at discharge: CSW assessing for appropriate plan Will patient be returning to same living situation after discharge?: Yes Currently receiving community mental health services: No If no, would patient like referral for services when discharged?: Yes (What county?) ([place] [facility].) Does patient have financial barriers related to discharge medications?: No (medicaid)  Summary/Recommendations:   Summary and Recommendations (to be completed by the evaluator): Pt is 51 year old male from [place]. [facility]).  Pt diagnosed with schizophrenia, cocaine and alcohol use disorders and admitted after leaving [facility], coming to [facility] ED and reporting suicidal thoughts.  REcommendations for pt include crisis stabilization, therapeutic milieu, attend and  participate in groups, medication management, and development of comprehensive mental health and substance use wellness  plan.  Pt is requesting residential substance abuse treatment upon discharge.  Lorri Frederick. 12/26/2016

## 2016-12-26 NOTE — ED Notes (Signed)
Pt told another staff member that he would be agreeable to transfer downstairs, but then he refused v/s and he refused to sign the voluntary consent for treatment. Therefore he cannot be transferred to the inpatient unit. Pt would not communicate with Clinical research associate and just demanded that the MD fix his asthma before he would cooperate with staff.

## 2016-12-26 NOTE — Progress Notes (Signed)
Patient has been more pleasant and cooperative, He also told this nurse that He was sorry for being mean this morning and that He knows that he needs to work on His anger issues, Nurse showed empathy, Patient with q 15 minute checks in progress.

## 2016-12-26 NOTE — H&P (Signed)
Psychiatric Admission Assessment Adult  Patient Identification: [name] MRN:  [id number] Date of Evaluation:  12/26/2016 Chief Complaint:  schizophrenia Principal Diagnosis: <principal problem not specified> Diagnosis:   Patient Active Problem List   Diagnosis Date Noted  . Asthma [J45.909] 12/26/2016  . Tobacco use disorder [F17.200] 12/26/2016  . Schizophrenia (HCC) [F20.9] 12/26/2016  . Cocaine abuse with cocaine-induced mood disorder (HCC) [F14.14] 09/06/2016  . Alcohol use disorder, mild, abuse [F10.10] 07/31/2016  . Substance induced mood disorder (HCC) [F19.94] 07/30/2016  . Schizoaffective disorder, bipolar type (HCC) [F25.0] 05/31/2016  . Cocaine use disorder, severe, dependence (HCC) [F14.20] 05/31/2016  . Essential hypertension [I10] 05/31/2016   History of Present Illness:   Identifying data. Mr. Caster is a 51 year old male with a history of cocaine dependence and self-reported diagnosis of schizophrenia.  Chief complaint. "They gave me the wrong medication."  History of present illness. Information was obtained from the patient and the chart. The patient reports a long history of mental illness. He has not been taking any medications recently. He was staying at the [facility] in [place]. He reports that he has been using alcohol and "drugs". He asked his case worker to take him to a detox facility. His caseworker drove him to [facility] in [place]. He left [facility] after 1 day as he believes that he was given the wrong medication. He does not believe the name of the medicine but complains that he felt dizzy and had slurred speech. He no longer is dizzy or has slurred speech. He was positive for benzodiazepine on admission but no alcohol. I suspect that he reported heavy alcohol use and was started on benzodiazepine detox. It appears that he did not need detox and was over medicated with benzodiazepines. He tells me that his last drink was 2 days ago but is very unclear  about. He is very easily agitated during the interview. He yells and screams at me. He refuses to answer basic questions. He believes that his Medicaid will pay for residential substance abuse treatment program anywhere in [place]. Apparently he has not tried to get into residential treatment available in [place]. The patient came to our emergency room last night complaining of suicidal ideation with a plan to cut his wrist and off auditory hallucinations. He was positive for cocaine on admission. He reports all symptoms of depression and psychosis. He reports drinking and drug use but is not specific.  Past psychiatric history. He reports that he was diagnosed schizophrenia. In Care Everywhere I can see that he was hospitalized in the past and treated with Depakote and Seroquel. He reports that he participated in residential substance abuse treatment program many years ago.  Family psychiatric history. Unknown.  Social history. The patient states that the [facility] and reportedly can return. It is unclear how he supports himself. He has Medicaid. It is possible that he receives disability. He will not discuss it with me. It is quite possible that the patient out of money using cocaine and has been embellishing.  Total Time spent with patient: 1 hour  Is the patient at risk to self? Yes.    Has the patient been a risk to self in the past 6 months? No.  Has the patient been a risk to self within the distant past? No.  Is the patient a risk to others? No.  Has the patient been a risk to others in the past 6 months? No.  Has the patient been a risk to others within  the distant past? No.   Prior Inpatient Therapy:   Prior Outpatient Therapy:    Alcohol Screening: Patient refused Alcohol Screening Tool: Yes 1. How often do you have a drink containing alcohol?:  (UTA) 9. Have you or someone else been injured as a result of your drinking?:  (UTA) 10. Has a relative or friend or a doctor  or another health worker been concerned about your drinking or suggested you cut down?:  (UTA) Brief Intervention: AUDIT score less than 7 or less-screening does not suggest unhealthy drinking-brief intervention not indicated Substance Abuse History in the last 12 months:  Yes.   Consequences of Substance Abuse: Negative Previous Psychotropic Medications: Yes  Psychological Evaluations: No  Past Medical History:  Past Medical History:  Diagnosis Date  . Asthma   . Bipolar 1 disorder (HCC)   . Depression   . Homelessness   . Hypertension   . Schizoaffective disorder (HCC)    History reviewed. No pertinent surgical history. Family History:  Family History  Problem Relation Age of Onset  . Drug abuse Mother   . Suicidality Mother     Tobacco Screening: Have you used any form of tobacco in the last 30 days? (Cigarettes, Smokeless Tobacco, Cigars, and/or Pipes): Yes Tobacco use, Select all that apply: (S)  (UTA) Are you interested in Tobacco Cessation Medications?:  (UTA) Counseled patient on smoking cessation including recognizing danger situations, developing coping skills and basic information about quitting provided: Refused/Declined practical counseling Social History:  History  Alcohol Use  . Yes    Comment: infrequently     History  Drug Use  . Types: Cocaine    Additional Social History:                           Allergies:   Allergies  Allergen Reactions  . Tylenol [Acetaminophen] Nausea And Vomiting   Lab Results:  Results for orders placed or performed during the hospital encounter of 12/26/16 (from the past 48 hour(s))  Lipid panel     Status: None   Collection Time: 12/26/16  6:55 AM  Result Value Ref Range   Cholesterol 172 0 - 200 mg/dL   Triglycerides 53 <144 mg/dL   HDL 88 >31 mg/dL   Total CHOL/HDL Ratio 2.0 RATIO   VLDL 11 0 - 40 mg/dL   LDL Cholesterol 73 0 - 99 mg/dL    Comment:        Total Cholesterol/HDL:CHD Risk Coronary Heart  Disease Risk Table                     Men   Women  1/2 Average Risk   3.4   3.3  Average Risk       5.0   4.4  2 X Average Risk   9.6   7.1  3 X Average Risk  23.4   11.0        Use the calculated Patient Ratio above and the CHD Risk Table to determine the patient's CHD Risk.        ATP III CLASSIFICATION (LDL):  <100     mg/dL   Optimal  540-086  mg/dL   Near or Above                    Optimal  130-159  mg/dL   Borderline  761-950  mg/dL   High  >932     mg/dL  Very High   TSH     Status: None   Collection Time: 12/26/16  6:55 AM  Result Value Ref Range   TSH 3.116 0.350 - 4.500 uIU/mL    Comment: Performed by a 3rd Generation assay with a functional sensitivity of <=0.01 uIU/mL.    Blood Alcohol level:  Lab Results  Component Value Date   [facility] <5 12/25/2016   ETH <5 11/27/2016    Metabolic Disorder Labs:  Lab Results  Component Value Date   HGBA1C 5.7 (H) 06/02/2016   MPG 117 06/02/2016   Lab Results  Component Value Date   PROLACTIN 9.6 06/02/2016   Lab Results  Component Value Date   CHOL 172 12/26/2016   TRIG 53 12/26/2016   HDL 88 12/26/2016   CHOLHDL 2.0 12/26/2016   VLDL 11 12/26/2016   LDLCALC 73 12/26/2016   LDLCALC 81 06/02/2016    Current Medications: Current Facility-Administered Medications  Medication Dose Route Frequency Provider Last Rate Last Dose  . acetaminophen (TYLENOL) tablet 650 mg  650 mg Oral Q6H PRN Audery Amel, MD      . albuterol (PROVENTIL HFA;VENTOLIN HFA) 108 (90 Base) MCG/ACT inhaler 2 puff  2 puff Inhalation Q4H PRN Audery Amel, MD      . alum & mag hydroxide-simeth (MAALOX/MYLANTA) 200-200-20 MG/5ML suspension 30 mL  30 mL Oral Q4H PRN Audery Amel, MD      . lisinopril (PRINIVIL,ZESTRIL) tablet 10 mg  10 mg Oral Daily Audery Amel, MD   10 mg at 12/26/16 0829  . magnesium hydroxide (MILK OF MAGNESIA) suspension 30 mL  30 mL Oral Daily PRN Audery Amel, MD      . nicotine (NICODERM CQ - dosed in mg/24  hours) patch 21 mg  21 mg Transdermal Daily Stephani Janak B Saivon Prowse, MD      . QUEtiapine (SEROQUEL) tablet 200 mg  200 mg Oral QHS Audery Amel, MD      . traZODone (DESYREL) tablet 150 mg  150 mg Oral QHS Audery Amel, MD       PTA Medications: Prescriptions Prior to Admission  Medication Sig Dispense Refill Last Dose  . albuterol (PROVENTIL HFA;VENTOLIN HFA) 108 (90 Base) MCG/ACT inhaler Inhale 1-2 puffs into the lungs every 4 (four) hours as needed for shortness of breath.   10/29/2016 at Unknown time  . benztropine (COGENTIN) 1 MG tablet Take 1 mg by mouth 2 (two) times daily.     . divalproex (DEPAKOTE ER) 500 MG 24 hr tablet Take 1 tablet (500 mg total) by mouth 2 (two) times daily. (Patient not taking: Reported on 10/29/2016) 60 tablet 0 Not Taking at Unknown time  . doxycycline (VIBRA-TABS) 100 MG tablet Take 1 tablet (100 mg total) by mouth 2 (two) times daily. (Patient not taking: Reported on 10/29/2016) 14 tablet 0 Not Taking at Unknown time  . gabapentin (NEURONTIN) 300 MG capsule Take 1 capsule (300 mg total) by mouth 3 (three) times daily. (Patient not taking: Reported on 10/29/2016) 90 capsule 0 Not Taking at Unknown time  . hydrOXYzine (ATARAX/VISTARIL) 25 MG tablet Take 1 tablet (25 mg total) by mouth every 6 (six) hours as needed for anxiety. (Patient not taking: Reported on 10/29/2016) 30 tablet 0 Not Taking at Unknown time  . lisinopril (PRINIVIL,ZESTRIL) 40 MG tablet Take 1 tablet (40 mg total) by mouth daily. 30 tablet 0 10/29/2016 at Unknown time  . predniSONE (DELTASONE) 20 MG tablet Take 2 tablets (40 mg total)  by mouth daily. (Patient not taking: Reported on 10/29/2016) 8 tablet 0 Not Taking at Unknown time  . pregabalin (LYRICA) 50 MG capsule Take 50 mg by mouth 2 (two) times daily.     . traZODone (DESYREL) 100 MG tablet Take 1 tablet (100 mg total) by mouth at bedtime. For sleep (Patient not taking: Reported on 10/29/2016) 30 tablet 0 Not Taking at Unknown time     Musculoskeletal: Strength & Muscle Tone: within normal limits Gait & Station: normal Patient leans: N/A  Psychiatric Specialty Exam: I reviewed physical examination performed in the emergency room and agree with the findings. Physical Exam  Nursing note and vitals reviewed. Psychiatric: His affect is angry and inappropriate. His speech is rapid and/or pressured. He is agitated, hyperactive and actively hallucinating. Cognition and memory are normal. He expresses impulsivity and inappropriate judgment. He expresses suicidal ideation. He expresses suicidal plans.    Review of Systems  Psychiatric/Behavioral: Positive for hallucinations, substance abuse and suicidal ideas.  All other systems reviewed and are negative.   Blood pressure (!) 160/104, pulse 65, resp. rate 20.There is no height or weight on file to calculate BMI.  See SRA.                                                  Sleep:       Treatment Plan Summary: Daily contact with patient to assess and evaluate symptoms and progress in treatment and Medication management   Mr. Rumble is a 51 year old male with history of cocaine dependence who came to the hospital from [facility] complaining of suicidal ideation with a plan to cut himself and auditory hallucinations.  1. Suicidal ideation. The patient is able to contract for safety in the hospital.  2. Mood. He was restarted on Seroquel for mood stabilization.  3. Substance abuse. The patient complains of alcohol and "drug abuse". He is no more specific. His last drink was apparently 2 days ago. Blood alcohol level on admission less than 5. Vital signs stable. I do not believe this patient needs alcohol detox. We'll continue to monitor.  4. Substance abuse treatment. The patient desires residential treatment and believes that his Medicaid would pay for anywhere in [place].  5. Asthma. He is on inhaler.  6. Hypertension. He is on lisinopril. Blood  pressure is elevate. I see that he was taking 40 mg of Lisinopril in the past.  7. Smoking. Nicotine patch is available.  8. Metabolic syndrome monitoring. Lipid panel and TSH are normal. Hemoglobin A1c pending.  9. EKG. Pending.  10. Insomnia. Trazodone is available.   11. Disposition. To be established.  possibly return to the [facility]. He has a IT trainer in [place]. He follows up with [facility].   Observation Level/Precautions:  15 minute checks  Laboratory:  CBC Chemistry Profile UDS UA  Psychotherapy:    Medications:    Consultations:    Discharge Concerns:    Estimated LOS:  Other:     Physician Treatment Plan for Primary Diagnosis: <principal problem not specified> Long Term Goal(s): Improvement in symptoms so as ready for discharge  Short Term Goals: Ability to identify changes in lifestyle to reduce recurrence of condition will improve, Ability to verbalize feelings will improve, Ability to disclose and discuss suicidal ideas, Ability to demonstrate self-control will improve, Ability to identify and develop effective coping  behaviors will improve, Ability to maintain clinical measurements within normal limits will improve, Compliance with prescribed medications will improve and Ability to identify triggers associated with substance abuse/mental health issues will improve  Physician Treatment Plan for Secondary Diagnosis: Active Problems:   Schizoaffective disorder, bipolar type (HCC)   Cocaine use disorder, severe, dependence (HCC)   Essential hypertension   Substance induced mood disorder (HCC)   Asthma   Tobacco use disorder  Long Term Goal(s): Improvement in symptoms so as ready for discharge  Short Term Goals: Ability to identify changes in lifestyle to reduce recurrence of condition will improve, Ability to demonstrate self-control will improve and Ability to identify triggers associated with substance abuse/mental health issues will improve  I certify  that inpatient services furnished can reasonably be expected to improve the patient's condition.    Kristine Linea, MD 1/26/20189:46 AM

## 2016-12-26 NOTE — Progress Notes (Signed)
Patient was calm and cooperative at the beginning of this shift. Became upset out of no where and started pacing, talking to self, refusing to talk to staff "you are not my nurse, you are  wrong, I don't even want you to be my nurse, I don't want anybody to be my nurse". Refusing medications at this moment: will continue to encourage. Safety precautions reinforced.

## 2016-12-26 NOTE — Progress Notes (Signed)
Patient is 51 year old BM admitted to the unit from [facility] seeking treatment for depression and SI, no plan. Dx schizophrenia, bipolar D/O, MDD and substance abuse. Patient present to the unit verbally aggressive, angry, argumentative and belligerent towards staff. V/s 161/109, HR 72. Auditory wheezing noted. Pt refused treatment and nursing assessment.  Patient searched for contraband none found. Skin checked with another nurse. Dry crusty heels and toenails to both feet noted. Skin warm and intact. No open areas noted. No voiced thoughts of hurting himself. No AV/H noted. No c/o pain/discomfort noted.

## 2016-12-26 NOTE — Progress Notes (Signed)
Patient is pacing in hallway states " I need to talk to the director here, and I need you to do something now" patient is agitated and speaking sarcastic to staff, charge did call Yehuda Mao that states she would talk with the Patient. Patient did go to his room and became calmer.

## 2016-12-26 NOTE — Tx Team (Signed)
Initial Treatment Plan 12/26/2016 4:34 AM [name] [id number]    PATIENT STRESSORS: Financial difficulties Substance abuse   PATIENT STRENGTHS: Communication skills   PATIENT IDENTIFIED PROBLEMS: Substance abuse  Suicidal ideation                   DISCHARGE CRITERIA:  Improved stabilization in mood, thinking, and/or behavior  PRELIMINARY DISCHARGE PLAN: Return to previous living arrangement  PATIENT/FAMILY INVOLVEMENT: This treatment plan has been presented to and reviewed with the patient, [name], and/or family member.  The patient and family have been given the opportunity to ask questions and make suggestions.  Foster Simpson, RN 12/26/2016, 4:34 AM

## 2016-12-26 NOTE — Progress Notes (Signed)
Patient is abrupt and rude at times, states " no I don't want a nicotine patch, and I am not taking anything, and states I don't want anything here, and I am going to bed, leave me alone, nurse talked to him about His b/p and he did agree to take His lisinipril. Nurse did get Patient another blanket and then He started to talk nice to nurse. Patient is unpredictable and labile. Patient is safe, nurse to monitor and q 15 minute checks. In progress.

## 2016-12-26 NOTE — ED Notes (Signed)
Pt finally did consent to voluntary treatment and signed consent form, although he still continues to be belligerent with staff.

## 2016-12-27 ENCOUNTER — Inpatient Hospital Stay: Payer: Medicaid Other

## 2016-12-27 LAB — HEMOGLOBIN A1C
Hgb A1c MFr Bld: 5.6 % (ref 4.8–5.6)
MEAN PLASMA GLUCOSE: 114 mg/dL

## 2016-12-27 IMAGING — CR DG CHEST 2V
1 series · 3 of 3 positions shown · non-contrast
Comparison: [DATE]

CLINICAL DATA: Shortness of breath.

EXAM:
CHEST  2 VIEW

[Series 1: dg chest 2 view · 0.14mm/px · 3 of 3 slices shown]
[im 1/3]
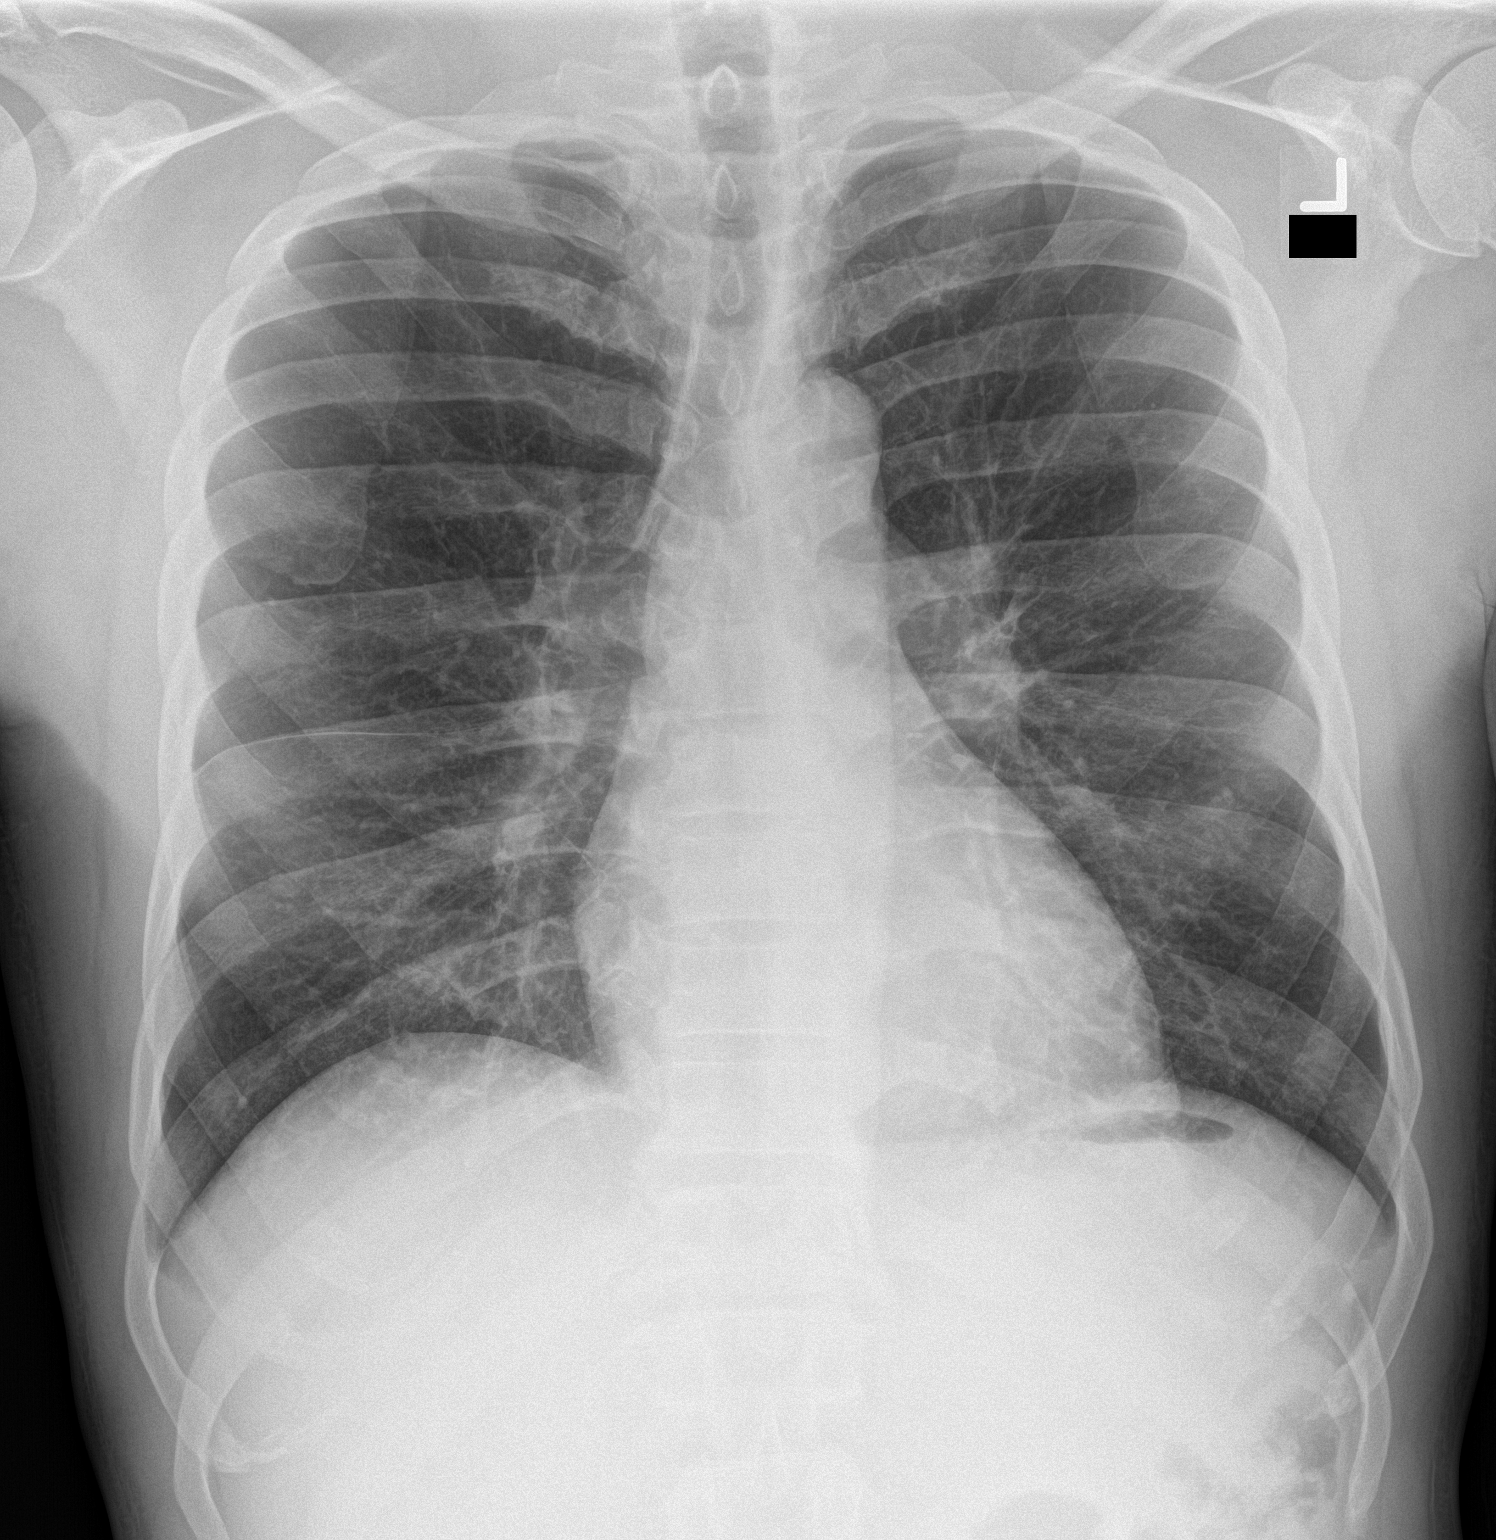
[im 2/3]
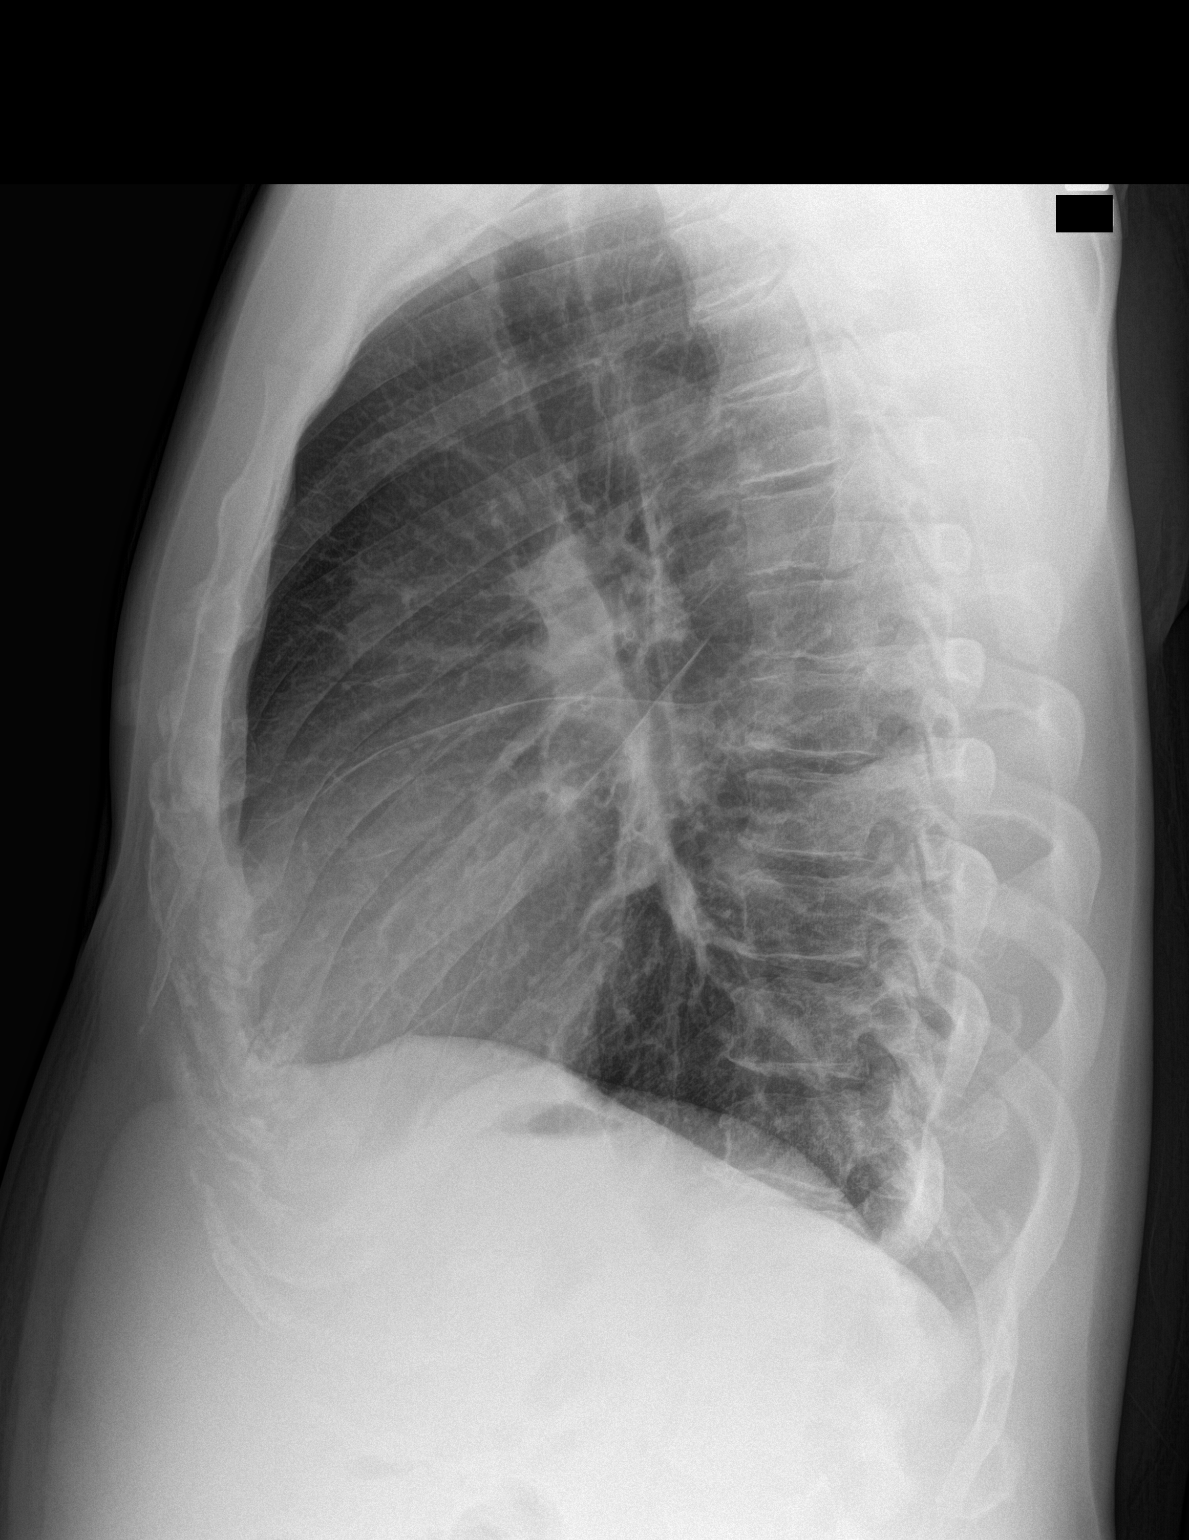
[im 3/3]
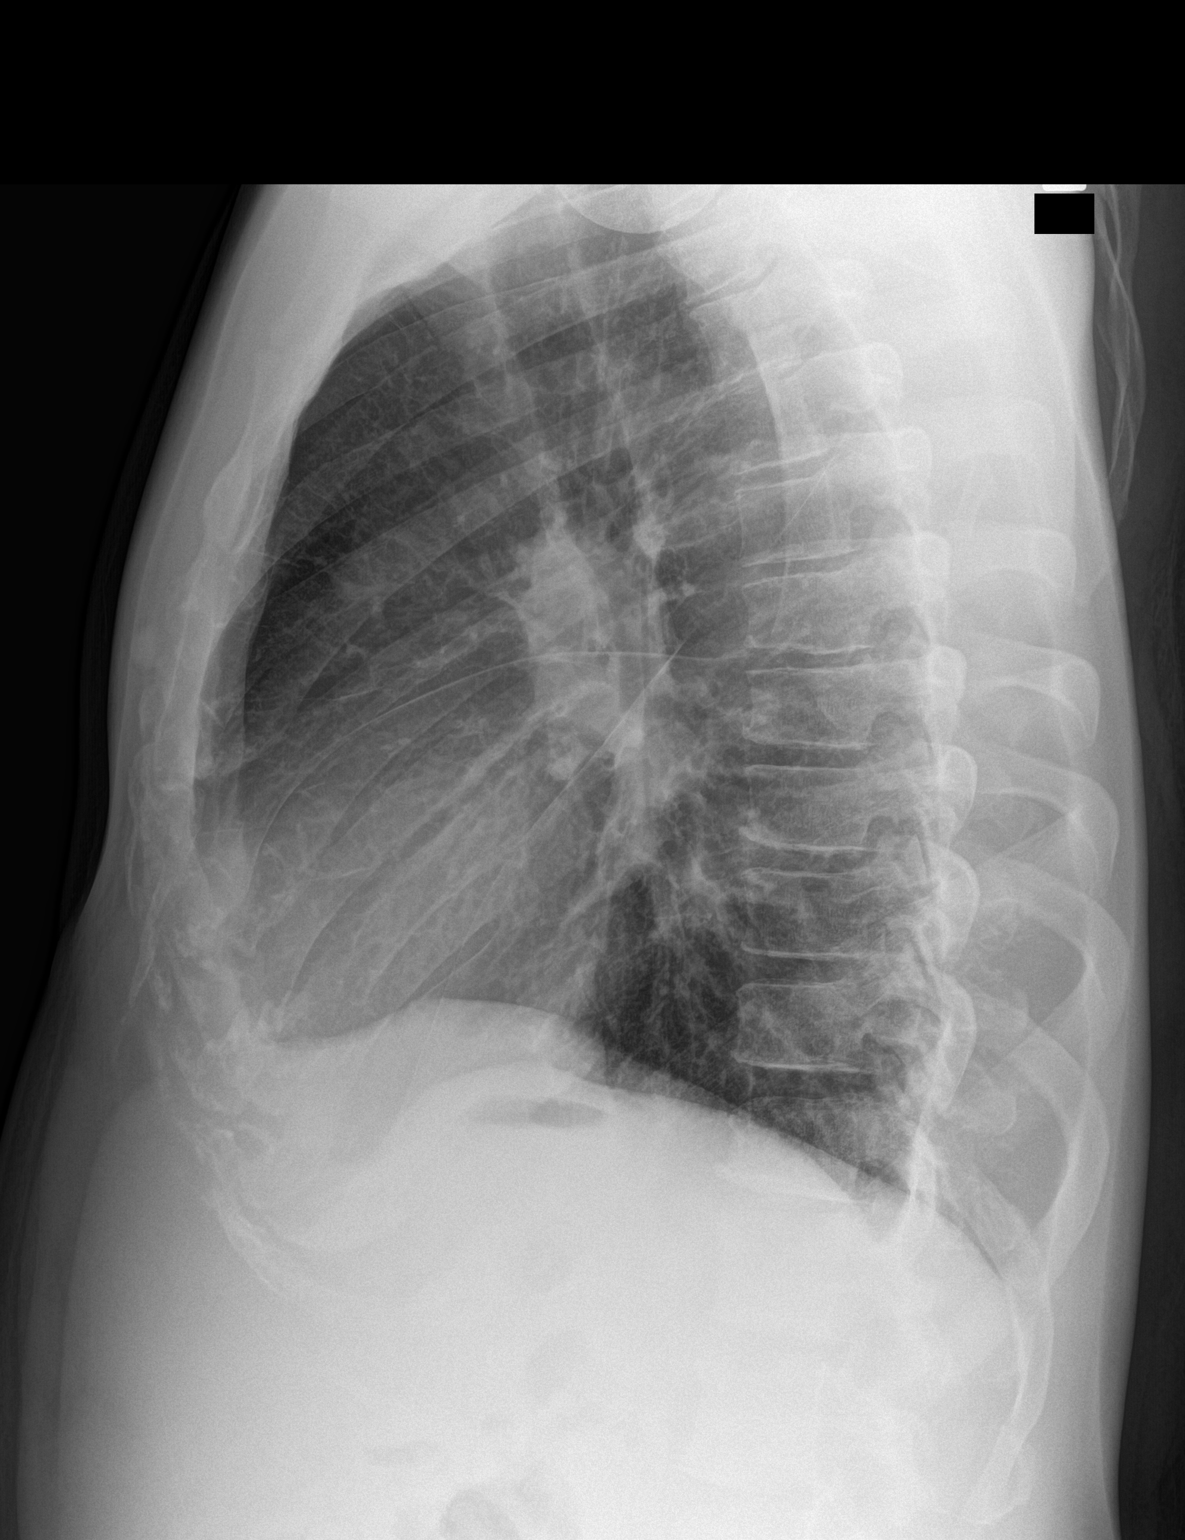

[3 of 3 positions shown; findings below may reference images not displayed]

FINDINGS: Cardiomediastinal silhouette is normal. Mediastinal contours appear
intact.

There is no evidence of focal airspace consolidation, pleural
effusion or pneumothorax.

Osseous structures are without acute abnormality. Soft tissues are
grossly normal.
IMPRESSION: No active cardiopulmonary disease.

## 2016-12-27 MED ORDER — ALBUTEROL SULFATE (2.5 MG/3ML) 0.083% IN NEBU
2.5000 mg | INHALATION_SOLUTION | RESPIRATORY_TRACT | Status: DC | PRN
  Administered 2016-12-27: 2.5 mg via RESPIRATORY_TRACT
  Filled 2016-12-27 (×2): qty 3

## 2016-12-27 MED ORDER — NYSTATIN 100000 UNIT/GM EX CREA
TOPICAL_CREAM | Freq: Two times a day (BID) | CUTANEOUS | Status: DC
  Administered 2016-12-27: 14:00:00 via TOPICAL
  Filled 2016-12-27: qty 15

## 2016-12-27 MED ORDER — GUAIFENESIN-DM 100-10 MG/5ML PO SYRP
5.0000 mL | ORAL_SOLUTION | ORAL | Status: DC | PRN
  Administered 2016-12-27: 5 mL via ORAL
  Filled 2016-12-27 (×2): qty 5

## 2016-12-27 MED ORDER — CLOTRIMAZOLE 1 % EX CREA
TOPICAL_CREAM | Freq: Two times a day (BID) | CUTANEOUS | Status: DC
  Filled 2016-12-27: qty 15

## 2016-12-27 NOTE — Progress Notes (Signed)
Patient stayed in the hallway, pacing, talking to self, irritable  Not back in bed sleeping. Safety precautions reinforced.

## 2016-12-27 NOTE — Progress Notes (Signed)
Pt awake, alert, oriented and up on unit this morning. Continues to be very irritable with staff. Complains of feeling sick, coughing-PRN medications given as ordered for breathing/coughing/pain to teeth (see MAR). Pt asleep in bed most of the morning. Paces the hallways this afternoon, talks to self. Does not attend groups. PT is anxious, loud, angry. Cooperative with this nurse. Compliant with all medications. Denies HI/AVH. Endorses passive SI without a plan here at the hospital.    Support and encouragement provided with use of therapeutic communication. Medications administered as ordered with education. Safety maintained with every 15 minute checks. Will continue to monitor.

## 2016-12-27 NOTE — Plan of Care (Signed)
Problem: Safety: Goal: Ability to remain free from injury will improve Outcome: Progressing Pt safe on the unit at this time   

## 2016-12-27 NOTE — [facility] Group Notes (Signed)
[facility] Group Notes:  (Nursing/MHT/Case Management/Adjunct)  Date:  12/27/2016  Time:  9:45 PM  Type of Therapy:  Evening Wrap-up Group  Participation Level:  Did Not Attend  Participation Quality:  N/A  Affect:  N/A  Cognitive:  N/A  Insight:  None  Engagement in Group:  Did Not Attend  Modes of Intervention:  Discussion  Summary of Progress/Problems:  Tomasita Morrow 12/27/2016, 9:45 PM

## 2016-12-27 NOTE — Progress Notes (Signed)
D: Pt passive SI-contracts for safety. Pt is pleasant and cooperative most of the evening. Pt labile on the unit. Pt irritable due to various things going on, but pt able to be redirected. Pt stated he had an issue from where he came because they told him he better take the medication or he would have to leave, but he told them he was having an issue with the medication and someone needed to look in to it, but he stated they just D/C'd him without giving him a chance to adjust the medications. Pt continues to be irritable on the unit this evening, but pt took his medications and went to his room for the evening.    A: Pt was offered support and encouragement. Pt was given scheduled medications. Pt was encourage to attend groups. Q 15 minute checks were done for safety.   R: Pt is taking medication. Pt receptive to treatment and safety maintained on unit.

## 2016-12-27 NOTE — Plan of Care (Signed)
Problem: Self-Concept: Goal: Ability to verbalize positive feelings about self will improve Outcome: Not Progressing Pt irritable, loud, pacing, anxious. Endorses passive SI without a specific plan here in the hospital.

## 2016-12-27 NOTE — [facility] Group Notes (Signed)
[facility] Group Notes:  (Nursing/MHT/Case Management/Adjunct)  Date:  12/27/2016  Time:  5:57 AM  Type of Therapy:  Psychoeducational Skills  Participation Level:  Did Not Attend   Summary of Progress/Problems:  [name] 12/27/2016, 5:57 AM

## 2016-12-27 NOTE — Plan of Care (Signed)
Problem: Coping: Goal: Ability to cope will improve Outcome: Not Progressing Irritable, angry. Pacing, refusing medications

## 2016-12-27 NOTE — Progress Notes (Signed)
0200: Patient currently out of bed, pacing, anxious, angry. Refusing medications. Safety precautions reinforced.

## 2016-12-27 NOTE — Plan of Care (Signed)
Problem: Medication: Goal: Compliance with prescribed medication regimen will improve Outcome: Not Progressing Refused his bedtime medications

## 2016-12-27 NOTE — [facility] Group Notes (Signed)
[facility] [name] Group Therapy  12/27/2016 2:11 PM  Type of Therapy:  Group Therapy  Participation Level:  Patient did not attend group. CSW invited patient to group.   Summary of Progress/Problems: Balance in life: Patients will discuss the concept of balance and how it looks and feels to be unbalanced. Pt will identify areas in their life that is unbalanced and ways to become more balanced. They discussed what aspects in their lives has influenced their self care. Patients also discussed self care in the areas of self regulation/control, hygiene/appearance, sleep/relaxation, healthy leisure, healthy eating habits, exercise, inner peace/spirituality, self improvement, sobriety, and health management. They were challenged to identify changes that are needed in order to improve self care.  Scribe for Group Therapy Note Zakariah Urwin G. Garnette Czech MSW, LCSWA 12/27/2016, 2:12 PM

## 2016-12-28 MED ORDER — AMLODIPINE BESYLATE 5 MG PO TABS
2.5000 mg | ORAL_TABLET | Freq: Every day | ORAL | Status: DC
  Administered 2016-12-28: 2.5 mg via ORAL
  Filled 2016-12-28 (×2): qty 1

## 2016-12-28 MED ORDER — DIPHENHYDRAMINE HCL 25 MG PO CAPS
50.0000 mg | ORAL_CAPSULE | Freq: Three times a day (TID) | ORAL | Status: DC | PRN
  Filled 2016-12-28: qty 2

## 2016-12-28 MED ORDER — HALOPERIDOL 5 MG PO TABS
5.0000 mg | ORAL_TABLET | Freq: Three times a day (TID) | ORAL | Status: DC | PRN
Start: 2016-12-28 — End: 2016-12-29

## 2016-12-28 MED ORDER — HALOPERIDOL LACTATE 5 MG/ML IJ SOLN
5.0000 mg | Freq: Three times a day (TID) | INTRAMUSCULAR | Status: DC | PRN
Start: 2016-12-28 — End: 2016-12-29

## 2016-12-28 MED ORDER — LORAZEPAM 2 MG PO TABS
2.0000 mg | ORAL_TABLET | ORAL | Status: DC | PRN
Start: 2016-12-28 — End: 2016-12-29
  Filled 2016-12-28: qty 1

## 2016-12-28 MED ORDER — DIPHENHYDRAMINE HCL 50 MG/ML IJ SOLN: 50.0000 mg | Freq: Three times a day (TID) | INTRAMUSCULAR | Status: DC | PRN

## 2016-12-28 MED ORDER — LORAZEPAM 2 MG/ML IJ SOLN: 2.0000 mg | INTRAMUSCULAR | Status: DC | PRN

## 2016-12-28 NOTE — Progress Notes (Signed)
[facility] MD Progress Note  12/28/2016 12:18 PM [name]  MRN:  [id number] Subjective:    Mr. Swor is a 51 year old male with a history of cocaine dependence and self-reported diagnosis of schizophrenia.   "They gave me the wrong medication."  The patient reports a long history of mental illness. He has not been taking any medications recently. He was staying at the [facility] in [place]. He reports that he has been using alcohol and "drugs". He asked his case worker to take him to a detox facility. His caseworker drove [facility] to [facility] in [place]. He left [facility] after 1 day as he believes that he was given the wrong medication. He does not believe the name of the medicine but complains that he felt dizzy and had slurred speech. He no longer is dizzy or has slurred speech. He was positive for benzodiazepine on admission but no alcohol. I suspect that he reported heavy alcohol use and was started on benzodiazepine detox. It appears that he did not need detox and was over medicated with benzodiazepines. He tells me that his last drink was 2 days ago but is very unclear about. He is very easily agitated during the interview. He yells and screams at me. He refuses to answer basic questions. He believes that his Medicaid will pay for residential substance abuse treatment program anywhere in [place]. Apparently he has not tried to get into residential treatment available in [place]. The patient came to our emergency room last night complaining of suicidal ideation with a plan to cut his wrist and off auditory hallucinations. He was positive for cocaine on admission. He reports all symptoms of depression and psychosis. He reports drinking and drug use but is not specific.  1/27 at the complaints today. He is very irritable. Patient says that he has shortness of breath, has been coughing and bringing up phlegm. He feels very congested. He has been using they have a but feels that is not helping.  Patient tells me he is angry, restless says that he didn't sleep at all last night. He feels very depressed. States that he is having thoughts of suicide and specifically thoughts of cutting his wrists however not here in the hospital. He is very angry at nurses as he feel that they don't care about his breathing issues  1/28 patient reports she is doing very well today. He denies having major issues or concerns to report. Patient denies suicidality, homicidality or having auditory or visual hallucinations. His mood is described as euthymic, he denies major problems with sleep, appetite energy or concentration. He says that the breathing treatments had helped him and his breathing has improved significantly. He denies having any physical complaints today. He is requesting to be transferred to a residential facility for substance abuse such as [facility].  Per nursing: Patient woke up from his nap and stated to staff, "something is not right." When asked pt. stated that he had a headache around his temple and frontal area of his head. VS(BP=152/94, HR=81, O2 Sats=100%). Patient was offered Motrin PRN but he declined it stating that he was fine. MD, Dr. Ardyth Harps notified.   During med pass, Patient refused his Nicotine patch. Patient agreed for writer to check his BP prior to giving his Lisinopril. (BP=151/135, HR=79). Lisinopril was given and discussed with patient that BP would be rechecked in an hour. At 0930, BP rechecked(BP=141/81, HR=66). MD Dr. Ardyth Harps notified.   Principal Problem: Schizoaffective disorder, bipolar type (HCC) Diagnosis:  Patient Active Problem List   Diagnosis Date Noted  . Asthma [J45.909] 12/26/2016  . Tobacco use disorder [F17.200] 12/26/2016  . Schizophrenia (HCC) [F20.9] 12/26/2016  . Cocaine abuse with cocaine-induced mood disorder (HCC) [F14.14] 09/06/2016  . Alcohol use disorder, mild, abuse [F10.10] 07/31/2016  . Substance induced mood disorder (HCC) [F19.94]  07/30/2016  . Schizoaffective disorder, bipolar type (HCC) [F25.0] 05/31/2016  . Cocaine use disorder, severe, dependence (HCC) [F14.20] 05/31/2016  . Essential hypertension [I10] 05/31/2016   Total Time spent with patient: 30 minutes  Past Psychiatric History: He reports that he was diagnosed schizophrenia. In Care Everywhere I can see that he was hospitalized in the past and treated with Depakote and Seroquel. He reports that he participated in residential substance abuse treatment program many years ago.  Past Medical History:  Past Medical History:  Diagnosis Date  . Asthma   . Bipolar 1 disorder (HCC)   . Depression   . Homelessness   . Hypertension   . Schizoaffective disorder (HCC)    History reviewed. No pertinent surgical history.  Family History:  Family History  Problem Relation Age of Onset  . Drug abuse Mother   . Suicidality Mother    Family Psychiatric  History: Unknown.  Social History: The patient states that the [facility] and reportedly can return. It is unclear how he supports himself. He has Medicaid. It is possible that he receives disability. He will not discuss it with me. It is quite possible that the patient out of money using cocaine and has been embellishing.  History  Alcohol Use  . Yes    Comment: infrequently     History  Drug Use  . Types: Cocaine    Social History   Social History  . Marital status: Single    Spouse name: N/A  . Number of children: N/A  . Years of education: N/A   Social History Main Topics  . Smoking status: Current Every Day Smoker    Types: Cigarettes  . Smokeless tobacco: Never Used  . Alcohol use Yes     Comment: infrequently  . Drug use: Yes    Types: Cocaine  . Sexual activity: Yes    Birth control/ protection: None   Other Topics Concern  . None   Social History Narrative  . None   Additional Social History:                        Current Medications: Current  Facility-Administered Medications  Medication Dose Route Frequency Provider Last Rate Last Dose  . albuterol (PROVENTIL HFA;VENTOLIN HFA) 108 (90 Base) MCG/ACT inhaler 2 puff  2 puff Inhalation Q4H PRN Audery Amel, MD   2 puff at 12/27/16 [phone]  . albuterol (PROVENTIL) (2.5 MG/3ML) 0.083% nebulizer solution 2.5 mg  2.5 mg Nebulization Q4H PRN Jimmy Footman, MD   2.5 mg at 12/27/16 1029  . alum & mag hydroxide-simeth (MAALOX/MYLANTA) 200-200-20 MG/5ML suspension 30 mL  30 mL Oral Q4H PRN Audery Amel, MD      . guaiFENesin-dextromethorphan (ROBITUSSIN DM) 100-10 MG/5ML syrup 5 mL  5 mL Oral Q4H PRN Jimmy Footman, MD   5 mL at 12/27/16 0936  . ibuprofen (ADVIL,MOTRIN) tablet 800 mg  800 mg Oral Q8H PRN Shari Prows, MD   800 mg at 12/27/16 2150  . lisinopril (PRINIVIL,ZESTRIL) tablet 40 mg  40 mg Oral Daily Shari Prows, MD   40 mg at 12/28/16 0823  .  magnesium hydroxide (MILK OF MAGNESIA) suspension 30 mL  30 mL Oral Daily PRN Audery Amel, MD      . nicotine (NICODERM CQ - dosed in mg/24 hours) patch 21 mg  21 mg Transdermal Daily Jolanta B Pucilowska, MD      . nystatin cream (MYCOSTATIN)   Topical BID Jolanta B Pucilowska, MD      . QUEtiapine (SEROQUEL) tablet 200 mg  200 mg Oral QHS Audery Amel, MD   200 mg at 12/27/16 2034  . traZODone (DESYREL) tablet 150 mg  150 mg Oral QHS Audery Amel, MD        Lab Results:  No results found for this or any previous visit (from the past 48 hour(s)).  Blood Alcohol level:  Lab Results  Component Value Date   ETH <5 12/25/2016   ETH <5 11/27/2016    Metabolic Disorder Labs: Lab Results  Component Value Date   HGBA1C 5.6 12/26/2016   MPG 114 12/26/2016   MPG 117 06/02/2016   Lab Results  Component Value Date   PROLACTIN 9.6 06/02/2016   Lab Results  Component Value Date   CHOL 172 12/26/2016   TRIG 53 12/26/2016   HDL 88 12/26/2016   CHOLHDL 2.0 12/26/2016   VLDL 11 12/26/2016    LDLCALC 73 12/26/2016   LDLCALC 81 06/02/2016    Physical Findings: AIMS: Facial and Oral Movements Muscles of Facial Expression: None, normal Lips and Perioral Area: None, normal Jaw: None, normal Tongue: None, normal,Extremity Movements Upper (arms, wrists, hands, fingers): None, normal Lower (legs, knees, ankles, toes): None, normal, Trunk Movements Neck, shoulders, hips: None, normal, Overall Severity Severity of abnormal movements (highest score from questions above): None, normal Incapacitation due to abnormal movements: None, normal Patient's awareness of abnormal movements (rate only patient's report): No Awareness, Dental Status Current problems with teeth and/or dentures?: No Does patient usually wear dentures?: No  CIWA:  CIWA-Ar Total: 0 COWS:  COWS Total Score: 0  Musculoskeletal: Strength & Muscle Tone: within normal limits Gait & Station: normal Patient leans: N/A  Psychiatric Specialty Exam: Physical Exam  Constitutional: He is oriented to person, place, and time. He appears well-developed and well-nourished.  HENT:  Head: Normocephalic and atraumatic.  Eyes: EOM are normal.  Neck: Normal range of motion.  Respiratory: Effort normal.  Musculoskeletal: Normal range of motion.  Neurological: He is alert and oriented to person, place, and time.    Review of Systems  Constitutional: Negative.   HENT: Negative.   Eyes: Negative.   Respiratory: Negative.   Cardiovascular: Negative.   Gastrointestinal: Negative.   Genitourinary: Negative.   Musculoskeletal: Negative.   Skin: Negative.   Neurological: Negative.   Endo/Heme/Allergies: Negative.   Psychiatric/Behavioral: Negative.     Blood pressure (!) 152/94, pulse 81, resp. rate 18, SpO2 100 %.There is no height or weight on file to calculate BMI.  General Appearance: Fairly Groomed  Eye Contact:  Minimal  Speech:  Clear and Coherent  Volume:  Normal  Mood:  Irritable  Affect:  Constricted  Thought  Process:  Linear and Descriptions of Associations: Intact  Orientation:  Full (Time, Place, and Person)  Thought Content:  Hallucinations: None  Suicidal Thoughts:  Yes.  with intent/plan  Homicidal Thoughts:  No  Memory:  Immediate;   Fair Recent;   Fair Remote;   Fair  Judgement:  Poor  Insight:  Shallow  Psychomotor Activity:  Decreased  Concentration:  Concentration: Poor and Attention Span: Poor  Recall:  Poor  Fund of Knowledge:  Poor  Language:  Fair  Akathisia:  No  Handed:    AIMS (if indicated):     Assets:  Communication Skills  ADL's:  Intact  Cognition:  WNL  Sleep:  Number of Hours: 6.3     Treatment Plan Summary: Mr. Prusinowski is a 51 year old male with history of cocaine dependence who came to the hospital from [facility] complaining of suicidal ideation with a plan to cut himself and auditory hallucinations.  1. Suicidal ideation. The patient is able to contract for safety in the hospital.  2. Mood. He was restarted on Seroquel for mood stabilization.  3. Substance abuse. The patient complains of alcohol and "drug abuse". He is no more specific. His last drink was apparently 2 days ago. Blood alcohol level on admission less than 5. Vital signs stable. I do not believe this patient needs alcohol detox. We'll continue to monitor.  4. Substance abuse treatment. The patient desires residential treatment and believes that his Medicaid would pay for anywhere in [place].  5. Asthma. He is on inhaler.  6. Hypertension. He is on lisinopril. Blood pressure is elevate. I see that he was taking 40 mg of Lisinopril in the past.  7. Smoking. Nicotine patch is available.  8. Metabolic syndrome monitoring. Lipid panel and TSH are normal. Hemoglobin A1c pending.  9. EKG. Pending.  10. Insomnia. Trazodone is available.   11. Disposition. To be established.  possibly return to the [facility]. He has a IT trainer in [place]. He follows up with  [facility].  1/27 I have ordered a respiratory consult for nebulization. Patient completed a checks x-ray which is negative. I have ordered also Robitussin and Mucinex.  Patient requested Lotrimin for his feet.  1/28 patient was quite irritable yesterday. Today he was calm and pleasant. He says he feels much better emotionally and physically.  Later on this morningbut the nurse who reported patient had complained of not feeling well. His blood pressure has been elevated, I will order Norvasc 2.5 mg once a day in addition to his scheduled dose of lisinopril 40 mg a day.   Jimmy Footman, MD 12/28/2016, 12:18 PM

## 2016-12-28 NOTE — Plan of Care (Signed)
Problem: Medication: Goal: Compliance with prescribed medication regimen will improve Outcome: Not Progressing Patient refused his Nicotine Patch and Nystatin cream. Patient picks and chooses what he wants to take or not.

## 2016-12-28 NOTE — Progress Notes (Signed)
Patient woke up from his nap and stated to staff, "something is not right." When asked pt. stated that he had a headache around his temple and frontal area of his head. VS(BP=152/94, HR=81, O2 Sats=100%). Patient was offered Motrin PRN but he declined it stating that he was fine. MD, Dr. Ardyth Harps notified.

## 2016-12-28 NOTE — Progress Notes (Signed)
During med pass, Patient refused his Nicotine patch. Patient agreed for writer to check his BP prior to giving his Lisinopril. (BP=151/135, HR=79). Lisinopril was given and discussed with patient that BP would be rechecked in an hour. At 0930, BP rechecked(BP=141/81, HR=66). MD Dr. Ardyth Harps notified.
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
## 2016-12-28 NOTE — [facility] Group Notes (Signed)
[facility] [name] Group Therapy  1/28/20181:00 PM(Note Entry for [name] Driscoll M. Shakiyah Cirilo, [name])  Type of Therapy: Group Therapy  Participation Level: Patient did not attend group. CSW invited patient to group.   Summary of Progress/Problems:The topic for group today was emotional regulation. This group focused on both positive and negative emotion identification and allowed group members to process ways to identify feelings, regulate negative emotions, and find healthy ways to manage internal/external emotions. Group members were asked to reflect on a time when their reaction to an emotion led to a negative outcome and explored how alternative responses using emotion regulation would have benefited them. Group members were also asked to discuss a time when emotion regulation was utilized when a negative emotion was experienced.   [name] Conran [name]  

## 2016-12-29 MED ORDER — TRAZODONE HCL 150 MG PO TABS: 150.0000 mg | ORAL_TABLET | Freq: Every day | ORAL | 1 refills | Status: DC

## 2016-12-29 MED ORDER — ALBUTEROL SULFATE HFA 108 (90 BASE) MCG/ACT IN AERS: 1.0000 | INHALATION_SPRAY | RESPIRATORY_TRACT | 1 refills | Status: DC | PRN

## 2016-12-29 MED ORDER — QUETIAPINE FUMARATE 200 MG PO TABS: 200.0000 mg | ORAL_TABLET | Freq: Every day | ORAL | 1 refills | Status: DC

## 2016-12-29 MED ORDER — LISINOPRIL 20 MG PO TABS
40.0000 mg | ORAL_TABLET | Freq: Every day | ORAL | Status: DC
  Filled 2016-12-29: qty 14

## 2016-12-29 MED ORDER — AMLODIPINE BESYLATE 2.5 MG PO TABS: 2.5000 mg | ORAL_TABLET | Freq: Every day | ORAL | 1 refills | Status: DC

## 2016-12-29 MED ORDER — LISINOPRIL 40 MG PO TABS: 40.0000 mg | ORAL_TABLET | Freq: Every day | ORAL | 1 refills | Status: DC

## 2016-12-29 MED ORDER — QUETIAPINE FUMARATE 200 MG PO TABS
200.0000 mg | ORAL_TABLET | Freq: Every day | ORAL | Status: DC
  Filled 2016-12-29: qty 7

## 2016-12-29 MED ORDER — AMLODIPINE BESYLATE 5 MG PO TABS
2.5000 mg | ORAL_TABLET | Freq: Every day | ORAL | Status: DC
  Filled 2016-12-29: qty 4

## 2016-12-29 MED ORDER — TRAZODONE HCL 50 MG PO TABS
150.0000 mg | ORAL_TABLET | Freq: Every day | ORAL | Status: DC
  Filled 2016-12-29: qty 21

## 2016-12-29 NOTE — [facility] Group Notes (Signed)
[facility] Group Notes:  (Nursing/MHT/Case Management/Adjunct)  Date:  12/29/2016  Time:  3:37 PM  Type of Therapy:  Psychoeducational Skills  Participation Level:  Did Not Attend  Lynelle Smoke [facility] 12/29/2016, 3:37 PM

## 2016-12-29 NOTE — Progress Notes (Signed)
12/29/16 1110 TC Joy [facility], [employer]. Zaccary would be eligible to return to the shelter prior to going to [facility] for residential treatment.  CSW would need to speak to Rogelia Boga regarding shelter bed.  Hao left in good standing and even if full, they would most likely allow him to stay in the lobby.  Attempted TC, Rogelia Boga. Sport and exercise psychologist He was in a meeting.  CSW asked to call back in one hour.  Daleen Squibb, [name]

## 2016-12-29 NOTE — Progress Notes (Signed)
12/29/16 1430 TC [name], [employer]. Loise can come back to [facility] with the condition that he make his appt at [facility] treatment on Thursday at 1145.  If he misses that appt, he will be discharged from [facility] Daleen Squibb, [name]

## 2016-12-29 NOTE — Progress Notes (Signed)
Patient denies SI/HI, denies A/V hallucinations. Patient verbalizes that he can read the  discharge instructions, follow up care and prescriptions. Patient given all belongings from  locker. Patient escorted out by staff, transported by security to bus stop.

## 2016-12-29 NOTE — Progress Notes (Signed)
D: Patient is alert and oriented on the unit this shift. Patient attended and actively participated in groups today. Patient denies suicidal ideation, homicidal ideation, auditory or visual hallucinations at the present time.  A: Scheduled medications are administered to patient as per MD orders. Emotional support and encouragement are provided. Patient is maintained on q.15 minute safety checks. Patient is informed to notify staff with questions or concerns. R: No adverse medication reactions are noted. Patient is cooperative with medication administration and treatment plan today. Patient is receptive, calm anxious and animated and cooperative on the unit at this time. Patient interacts well with others on the unit this shift. Patient contracts for safety at this time. Patient remains safe at this time. Anxiety 5/10 Depression 3/10

## 2016-12-29 NOTE — Progress Notes (Signed)
  [facility] Adult Case Management Discharge Plan :  Will you be returning to the same living situation after discharge:  Yes,  [facility], [employer] At discharge, do you have transportation home?: Yes,  [employer] bus Do you have the ability to pay for your medications: Yes,  medicaid  Release of information consent forms completed and in the chart;  Patient's signature needed at discharge.  Patient to Follow up at: Follow-up Information    [facility] Follow up on 01/01/2017.   Why:  Intake assessment for Substance use treatment on this date at 11:45am. Bring Photo I.D., medications, and person items such as clothing (no more than 14 outfits) that you will need for atleast the next 30 days.  Contact information: [address] [name] [address] [phone]           Next level of care provider has access to [facility] Link:yes  Safety Planning and Suicide Prevention discussed: No.  Have you used any form of tobacco in the last 30 days? (Cigarettes, Smokeless Tobacco, Cigars, and/or Pipes): Yes  Has patient been referred to the Quitline?: Patient refused referral  Patient has been referred for addiction treatment: Yes  Lorri Frederick, [name] 12/29/2016, 2:31 PM

## 2016-12-29 NOTE — Progress Notes (Signed)
Recreation Therapy Notes  Date: 01.29.18 Time: 9:30 am Location: Craft Room  Group Topic: Self-expression  Goal Area(s) Addresses:  Patient will effectively use art as a means of self-expression. Patient will recognize positive benefit of self-expression. Patient will be able to identify one emotion experienced during group session. Patient will identify use of art as a coping skill.  Behavioral Response: Did not attend  Intervention: Two Faces of Me  Activity: Patients were given a blank face worksheet and were instructed to draw a line down the middle. On one side of the worksheet, patients were instructed to draw or write how they felt when they were admitted. On the other side, patients were instructed to draw or write how they want to feel when they are d/c.   Education: LRT educated patients on different forms of self-expression.   Education Outcome: Patient did not attend group.   Clinical Observations/Feedback: Patient did not attend group.  Jacquelynn Cree, LRT/CTRS 12/29/2016 9:56 AM

## 2016-12-29 NOTE — Plan of Care (Signed)
Problem: Medication: Goal: Compliance with prescribed medication regimen will improve Outcome: Progressing Patient taking medications at this shift [place] RN

## 2016-12-29 NOTE — Progress Notes (Signed)
Patient refused any explanation about the discharge instruction.States "I can read."Signed the AVS.

## 2016-12-29 NOTE — [facility] Suicide Risk Assessment (Signed)
[facility] Discharge Suicide Risk Assessment   Principal Problem: Schizoaffective disorder, bipolar type [facility]) Discharge Diagnoses:  Patient Active Problem List   Diagnosis Date Noted  . Asthma [J45.909] 12/26/2016  . Tobacco use disorder [F17.200] 12/26/2016  . Schizophrenia (HCC) [F20.9] 12/26/2016  . Cocaine abuse with cocaine-induced mood disorder (HCC) [F14.14] 09/06/2016  . Alcohol use disorder, mild, abuse [F10.10] 07/31/2016  . Substance induced mood disorder (HCC) [F19.94] 07/30/2016  . Schizoaffective disorder, bipolar type (HCC) [F25.0] 05/31/2016  . Cocaine use disorder, severe, dependence (HCC) [F14.20] 05/31/2016  . Essential hypertension [I10] 05/31/2016    Total Time spent with patient: 30 minutes  Musculoskeletal: Strength & Muscle Tone: within normal limits Gait & Station: normal Patient leans: N/A  Psychiatric Specialty Exam: Review of Systems  All other systems reviewed and are negative.   Blood pressure (!) 152/94, pulse 81, resp. rate 18, SpO2 100 %.There is no height or weight on file to calculate BMI.  General Appearance: Casual  Eye Contact::  Good  Speech:  Clear and Coherent409  Volume:  Normal  Mood:  Irritable  Affect:  Appropriate  Thought Process:  Goal Directed and Descriptions of Associations: Intact  Orientation:  Full (Time, Place, and Person)  Thought Content:  WDL  Suicidal Thoughts:  No  Homicidal Thoughts:  No  Memory:  Immediate;   Fair Recent;   Fair Remote;   Fair  Judgement:  Impaired  Insight:  Shallow  Psychomotor Activity:  Normal  Concentration:  Fair  Recall:  [employer] of Knowledge:Fair  Language: Fair  Akathisia:  No  Handed:  Right  AIMS (if indicated):     Assets:  Communication Skills Desire for Improvement Financial Resources/Insurance Physical Health Resilience Social Support  Sleep:  Number of Hours: 5.15  Cognition: WNL  ADL's:  Intact   Mental Status Per Nursing Assessment::   On Admission:      Demographic Factors:  Male  Loss Factors: Financial problems/change in socioeconomic status  Historical Factors: Prior suicide attempts, Family history of mental illness or substance abuse and Impulsivity  Risk Reduction Factors:   Sense of responsibility to family, Positive social support and Positive therapeutic relationship  Continued Clinical Symptoms:  Bipolar Disorder:   Depressive phase Depression:   Comorbid alcohol abuse/dependence Impulsivity Alcohol/Substance Abuse/Dependencies  Cognitive Features That Contribute To Risk:  None    Suicide Risk:  Minimal: No identifiable suicidal ideation.  Patients presenting with no risk factors but with morbid ruminations; may be classified as minimal risk based on the severity of the depressive symptoms  Follow-up Information    [facility] Follow up on 01/01/2017.   Why:  Intake assessment for Substance use treatment on this date at 11:45am. Bring Photo I.D., medications, and person items such as clothing (no more than 14 outfits) that you will need for atleast the next 30 days.  Contact information: [address] [name] [address] [phone]           Plan Of Care/Follow-up recommendations:  Activity:  As tolerated. Diet:  Low sodium heart healthy. Other:  Keep follow-up appointments.  Kristine Linea, MD 12/29/2016, 12:39 PM

## 2016-12-29 NOTE — Progress Notes (Signed)
Patient refused to take medicines this morning.atient was irritable & rude to staff his morning.Explained to patient about the blood pressure medicine & offered to check the VS since patient did not allow the night staff to take the VS.

## 2016-12-29 NOTE — Discharge Summary (Signed)
Physician Discharge Summary Note  Patient:  [name] is an 51 y.o., male MRN:  [id number] DOB:  May 12, 1966 Patient phone:  [phone] (home)  Patient address:   [address] [name] [address],  Total Time spent with patient: 30 minutes  Date of Admission:  12/26/2016 Date of Discharge: 12/29/2016  Reason for Admission:  Suicidal ideation.  Identifying data. [name] is a 51 year old male with a history of cocaine dependence and self-reported diagnosis of schizophrenia.  Chief complaint. "They gave me the wrong medication."  History of present illness. Information was obtained from the patient and the chart. The patient reports a long history of mental illness. He has not been taking any medications recently. He was staying at the [facility] in [place]. He reports that he has been using alcohol and "drugs". He asked his case worker to take him to a detox facility. His caseworker drove him to [facility] in [place]. He left [facility] after 1 day as he believes that he was given the wrong medication. He does not believe the name of the medicine but complains that he felt dizzy and had slurred speech. He no longer is dizzy or has slurred speech. He was positive for benzodiazepine on admission but no alcohol. I suspect that he reported heavy alcohol use and was started on benzodiazepine detox. It appears that he did not need detox and was over medicated with benzodiazepines. He tells me that his last drink was 2 days ago but is very unclear about. He is very easily agitated during the interview. He yells and screams at me. He refuses to answer basic questions. He believes that his Medicaid will pay for residential substance abuse treatment program anywhere in [place]. Apparently he has not tried to get into residential treatment available in [place]. The patient came to our emergency room last night complaining of suicidal ideation with a plan to cut his wrist and off auditory  hallucinations. He was positive for cocaine on admission. He reports all symptoms of depression and psychosis. He reports drinking and drug use but is not specific.  Past psychiatric history. He reports that he was diagnosed schizophrenia. In Care Everywhere I can see that he was hospitalized in the past and treated with Depakote and Seroquel. He reports that he participated in residential substance abuse treatment program many years ago.  Family psychiatric history. Unknown.  Social history. The patient states that the [facility] house and reportedly can return. It is unclear how he supports himself. He has Medicaid. It is possible that he receives disability. He will not discuss it with me. It is quite possible that the patient out of money using cocaine and has been embellishing.  Principal Problem: Schizoaffective disorder, bipolar type [facility]) Discharge Diagnoses: Patient Active Problem List   Diagnosis Date Noted  . Asthma [J45.909] 12/26/2016  . Tobacco use disorder [F17.200] 12/26/2016  . Schizophrenia (HCC) [F20.9] 12/26/2016  . Cocaine abuse with cocaine-induced mood disorder (HCC) [F14.14] 09/06/2016  . Alcohol use disorder, mild, abuse [F10.10] 07/31/2016  . Substance induced mood disorder (HCC) [F19.94] 07/30/2016  . Schizoaffective disorder, bipolar type (HCC) [F25.0] 05/31/2016  . Cocaine use disorder, severe, dependence (HCC) [F14.20] 05/31/2016  . Essential hypertension [I10] 05/31/2016     Past Medical History:  Past Medical History:  Diagnosis Date  . Asthma   . Bipolar 1 disorder (HCC)   . Depression   . Homelessness   . Hypertension   . Schizoaffective disorder (HCC)    History reviewed.  No pertinent surgical history. Family History:  Family History  Problem Relation Age of Onset  . Drug abuse Mother   . Suicidality Mother    Social History:  History  Alcohol Use  . Yes    Comment: infrequently     History  Drug Use  . Types: Cocaine    Social  History   Social History  . Marital status: Single    Spouse name: N/A  . Number of children: N/A  . Years of education: N/A   Social History Main Topics  . Smoking status: Current Every Day Smoker    Types: Cigarettes  . Smokeless tobacco: Never Used  . Alcohol use Yes     Comment: infrequently  . Drug use: Yes    Types: Cocaine  . Sexual activity: Yes    Birth control/ protection: None   Other Topics Concern  . None   Social History Narrative  . None    Hospital Course:    [name] is a 51 year old male with history of cocaine dependence who came to the hospital from [facility] complaining of suicidal ideation with a plan to cut himself and auditory hallucinations.  1. Suicidal ideation. Resolved. The patient is able to contract for safety. He is forward thinking and optimistic about the future.  2. Mood. He was restarted on Seroquel for mood stabilization.  3. Substance abuse. The patient complains of alcohol and "drug abuse". He did not require alcohol detox.   4. Substance abuse treatment. The patient desires residential treatment. He was referred to [facility] program in [place]. His intake appointment is on Fabruary 1.   5. Asthma. He is on inhaler. Chest X-ray was negative.   6. Hypertension. He is on lisinopril. We added Norvasc for still elevated blood pressure.  7. Smoking. Nicotine patch was available.  8. Metabolic syndrome monitoring. Lipid panel, TSH, and Hemoglobin A1c are normal.  9. Insomnia. Trazodone was available.   10. Disposition. He was discharged back to the [facility]. He will follow up with [facility].  Physical Findings: AIMS: Facial and Oral Movements Muscles of Facial Expression: None, normal Lips and Perioral Area: None, normal Jaw: None, normal Tongue: None, normal,Extremity Movements Upper (arms, wrists, hands, fingers): None, normal Lower (legs, knees, ankles, toes): None, normal, Trunk Movements Neck, shoulders, hips:  None, normal, Overall Severity Severity of abnormal movements (highest score from questions above): None, normal Incapacitation due to abnormal movements: None, normal Patient's awareness of abnormal movements (rate only patient's report): No Awareness, Dental Status Current problems with teeth and/or dentures?: No Does patient usually wear dentures?: No  CIWA:  CIWA-Ar Total: 0 COWS:  COWS Total Score: 0  Musculoskeletal: Strength & Muscle Tone: within normal limits Gait & Station: normal Patient leans: N/A  Psychiatric Specialty Exam: Physical Exam  Nursing note and vitals reviewed. Psychiatric: His speech is normal and behavior is normal. Thought content normal. His affect is labile. Cognition and memory are normal. He expresses impulsivity.    ROS  Blood pressure (!) 152/94, pulse 81, resp. rate 18, SpO2 100 %.There is no height or weight on file to calculate BMI.  General Appearance: Casual  Eye Contact:  Good  Speech:  Clear and Coherent  Volume:  Normal  Mood:  Irritable  Affect:  Appropriate  Thought Process:  Goal Directed and Descriptions of Associations: Intact  Orientation:  Full (Time, Place, and Person)  Thought Content:  WDL  Suicidal Thoughts:  No  Homicidal Thoughts:  No  Memory:  Immediate;   Fair Recent;   Fair Remote;   Fair  Judgement:  Impaired  Insight:  Shallow  Psychomotor Activity:  Normal  Concentration:  Concentration: Fair and Attention Span: Fair  Recall:  [employer] of Knowledge:  Fair  Language:  Fair  Akathisia:  No  Handed:  Right  AIMS (if indicated):     Assets:  Communication Skills Desire for Improvement Financial Resources/Insurance Housing Physical Health Resilience Social Support  ADL's:  Intact  Cognition:  WNL  Sleep:  Number of Hours: 5.15     Have you used any form of tobacco in the last 30 days? (Cigarettes, Smokeless Tobacco, Cigars, and/or Pipes): Yes  Has this patient used any form of tobacco in the last 30  days? (Cigarettes, Smokeless Tobacco, Cigars, and/or Pipes) Yes, Yes, A prescription for an FDA-approved tobacco cessation medication was offered at discharge and the patient refused  Blood Alcohol level:  Lab Results  Component Value Date   [facility] <5 12/25/2016   ETH <5 11/27/2016    Metabolic Disorder Labs:  Lab Results  Component Value Date   HGBA1C 5.6 12/26/2016   MPG 114 12/26/2016   MPG 117 06/02/2016   Lab Results  Component Value Date   PROLACTIN 9.6 06/02/2016   Lab Results  Component Value Date   CHOL 172 12/26/2016   TRIG 53 12/26/2016   HDL 88 12/26/2016   CHOLHDL 2.0 12/26/2016   VLDL 11 12/26/2016   LDLCALC 73 12/26/2016   LDLCALC 81 06/02/2016    See Psychiatric Specialty Exam and Suicide Risk Assessment completed by Attending Physician prior to discharge.  Discharge destination:  Home  Is patient on multiple antipsychotic therapies at discharge:  No   Has Patient had three or more failed trials of antipsychotic monotherapy by history:  No  Recommended Plan for Multiple Antipsychotic Therapies: NA  Discharge Instructions    Diet - low sodium heart healthy    Complete by:  As directed    Increase activity slowly    Complete by:  As directed      Allergies as of 12/29/2016      Reactions   Tylenol [acetaminophen] Nausea And Vomiting      Medication List    STOP taking these medications   benztropine 1 MG tablet Commonly known as:  COGENTIN   divalproex 500 MG 24 hr tablet Commonly known as:  DEPAKOTE ER   doxycycline 100 MG tablet Commonly known as:  VIBRA-TABS   gabapentin 300 MG capsule Commonly known as:  NEURONTIN   hydrOXYzine 25 MG tablet Commonly known as:  ATARAX/VISTARIL   predniSONE 20 MG tablet Commonly known as:  DELTASONE   pregabalin 50 MG capsule Commonly known as:  LYRICA     TAKE these medications     Indication  albuterol 108 (90 Base) MCG/ACT inhaler Commonly known as:  PROVENTIL HFA;VENTOLIN HFA Inhale 1-2  puffs into the lungs every 4 (four) hours as needed for shortness of breath.  Indication:  Chronic Obstructive Lung Disease   amLODipine 2.5 MG tablet Commonly known as:  NORVASC Take 1 tablet (2.5 mg total) by mouth daily.  Indication:  High Blood Pressure Disorder   lisinopril 40 MG tablet Commonly known as:  PRINIVIL,ZESTRIL Take 1 tablet (40 mg total) by mouth daily.  Indication:  High Blood Pressure Disorder   QUEtiapine 200 MG tablet Commonly known as:  SEROQUEL Take 1 tablet (200 mg total) by mouth at bedtime.  Indication:  Depressive Phase  of Manic-Depression   traZODone 150 MG tablet Commonly known as:  DESYREL Take 1 tablet (150 mg total) by mouth at bedtime. What changed:  medication strength  how much to take  additional instructions  Indication:  Trouble Sleeping      Follow-up Information    [facility] Follow up on 01/01/2017.   Why:  Intake assessment for Substance use treatment on this date at 11:45am. Bring Photo I.D., medications, and person items such as clothing (no more than 14 outfits) that you will need for atleast the next 30 days.  Contact information: [address] [name] [address] [phone]           Follow-up recommendations:  Activity:  As tolerated. Diet:  Low sodium heart healthy. Other:  Keep follow-up appointments.  Comments:    Signed: Kristine Linea, MD 12/29/2016, 12:42 PM

## 2016-12-29 NOTE — [facility] Group Notes (Signed)
[facility] Group Notes:  (Nursing/MHT/Case Management/Adjunct)  Date:  12/29/2016  Time:  3:50 AM  Type of Therapy:  Group Therapy  Participation Level:  Did Not Attend   [name] 12/29/2016, 3:50 AM

## 2016-12-29 NOTE — Plan of Care (Signed)
Problem: Coping: Goal: Ability to cope will improve Outcome: Progressing Patient progressing with coping. Patient very talkative ,cooperative and comlpient with medications and group at this time [employer]

## 2016-12-29 NOTE — Progress Notes (Signed)
12/29/16 1100 TC [facility], [place]. 90% of clients are admitted to residential after their intake appt.  CSW was also able to verify that pt does have [place] medicaid, which will serve as his proof of residence, even though his ID card has a [place] address on it. Daleen Squibb, [name]

## 2016-12-30 ENCOUNTER — Emergency Department (HOSPITAL_COMMUNITY): Payer: Medicaid Other

## 2016-12-30 ENCOUNTER — Emergency Department (HOSPITAL_COMMUNITY)
Admission: EM | Admit: 2016-12-30 | Discharge: 2016-12-30 | Disposition: A | Discharge status: Home / Self Care | Payer: Medicaid Other | Attending: Emergency Medicine | Admitting: Emergency Medicine

## 2016-12-30 ENCOUNTER — Encounter (HOSPITAL_COMMUNITY): Payer: Self-pay

## 2016-12-30 DIAGNOSIS — I10 Essential (primary) hypertension: Secondary | ICD-10-CM | POA: Diagnosis not present

## 2016-12-30 DIAGNOSIS — F17210 Nicotine dependence, cigarettes, uncomplicated: Secondary | ICD-10-CM | POA: Insufficient documentation

## 2016-12-30 DIAGNOSIS — J45909 Unspecified asthma, uncomplicated: Secondary | ICD-10-CM | POA: Diagnosis not present

## 2016-12-30 DIAGNOSIS — J069 Acute upper respiratory infection, unspecified: Secondary | ICD-10-CM | POA: Diagnosis not present

## 2016-12-30 DIAGNOSIS — R0602 Shortness of breath: Secondary | ICD-10-CM | POA: Diagnosis present

## 2016-12-30 DIAGNOSIS — Z79899 Other long term (current) drug therapy: Secondary | ICD-10-CM | POA: Insufficient documentation

## 2016-12-30 IMAGING — CR DG CHEST 2V
2 series · 2 of 2 positions shown · non-contrast
Comparison: [DATE]

CLINICAL DATA: Dyspnea.

EXAM:
CHEST  2 VIEW

[chest pa]
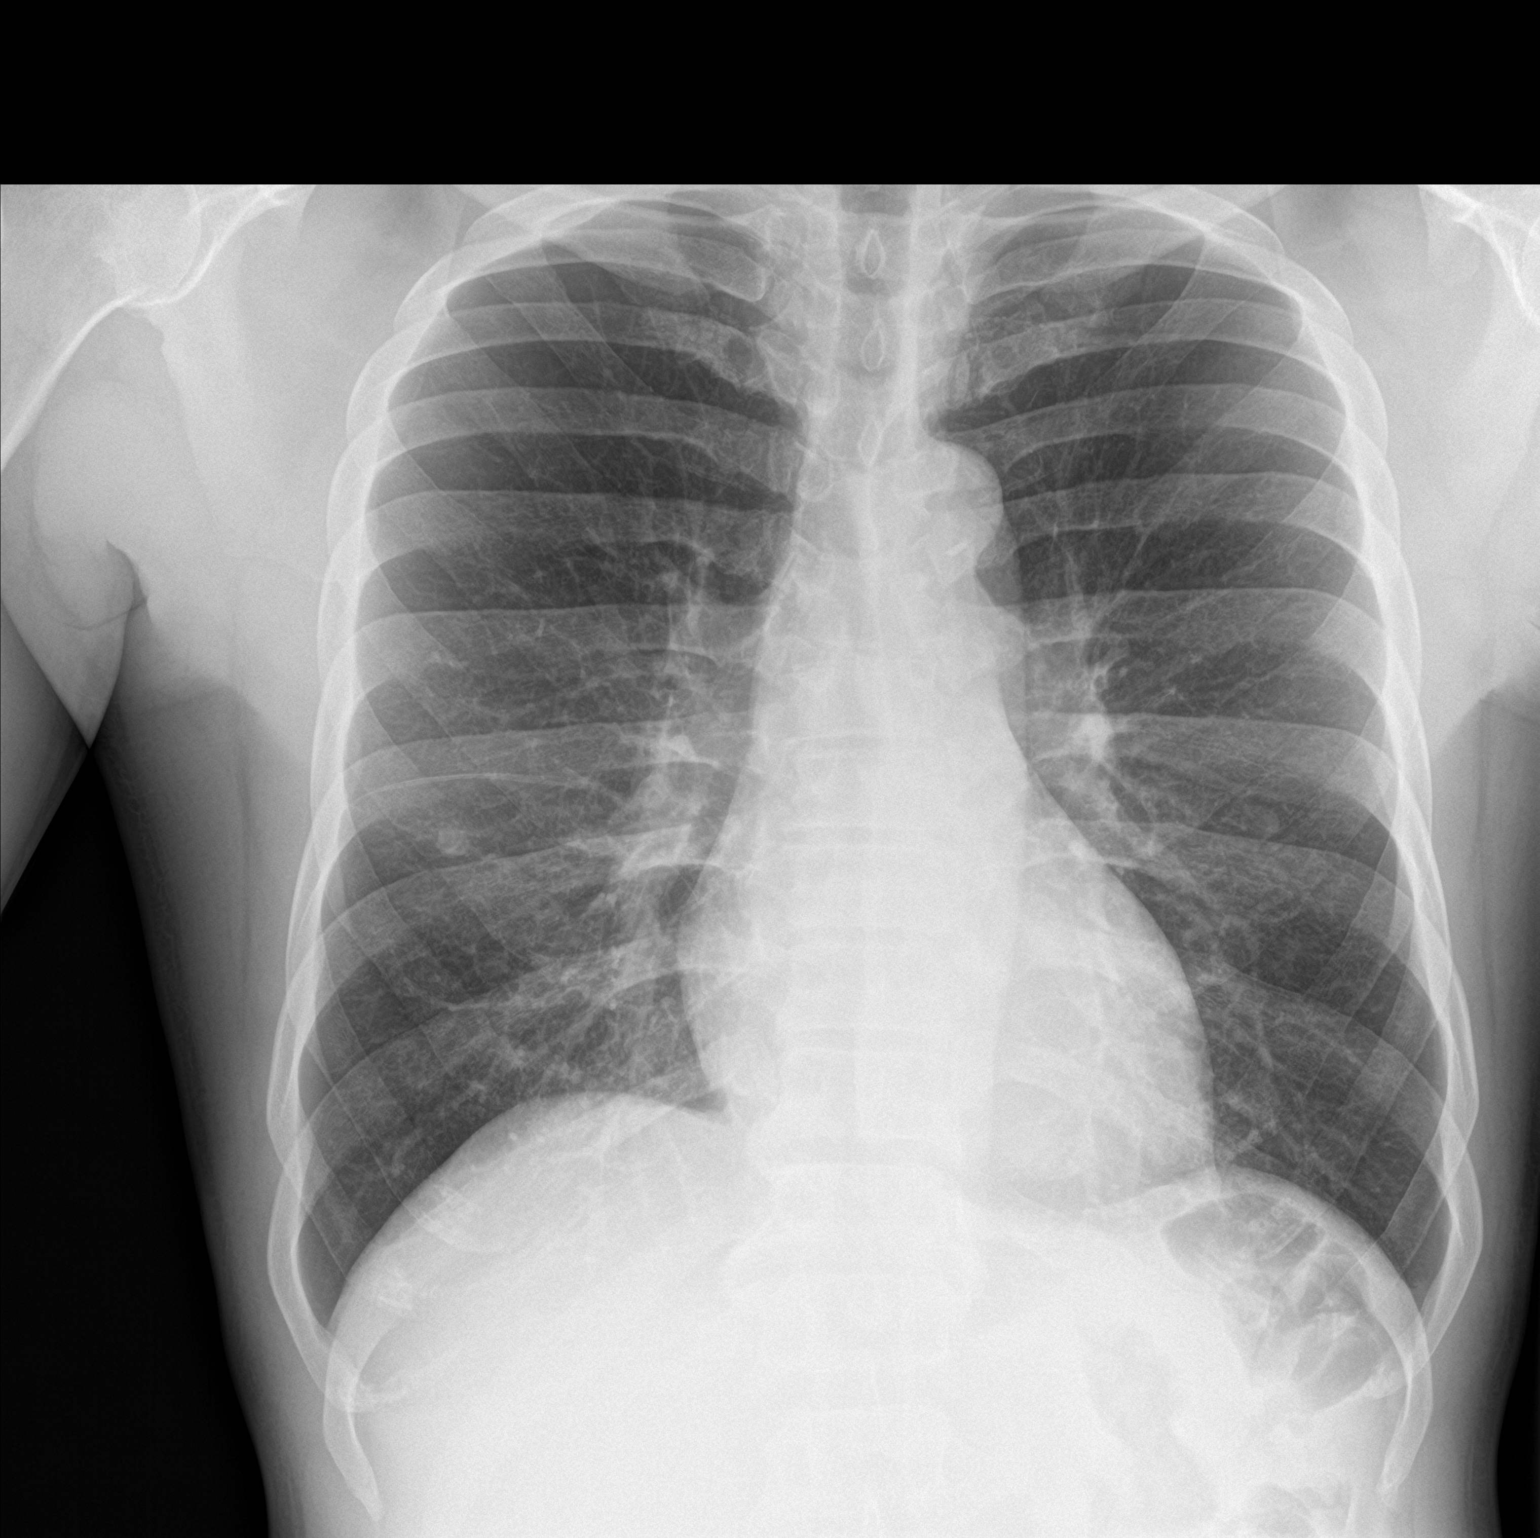

[chest lat]
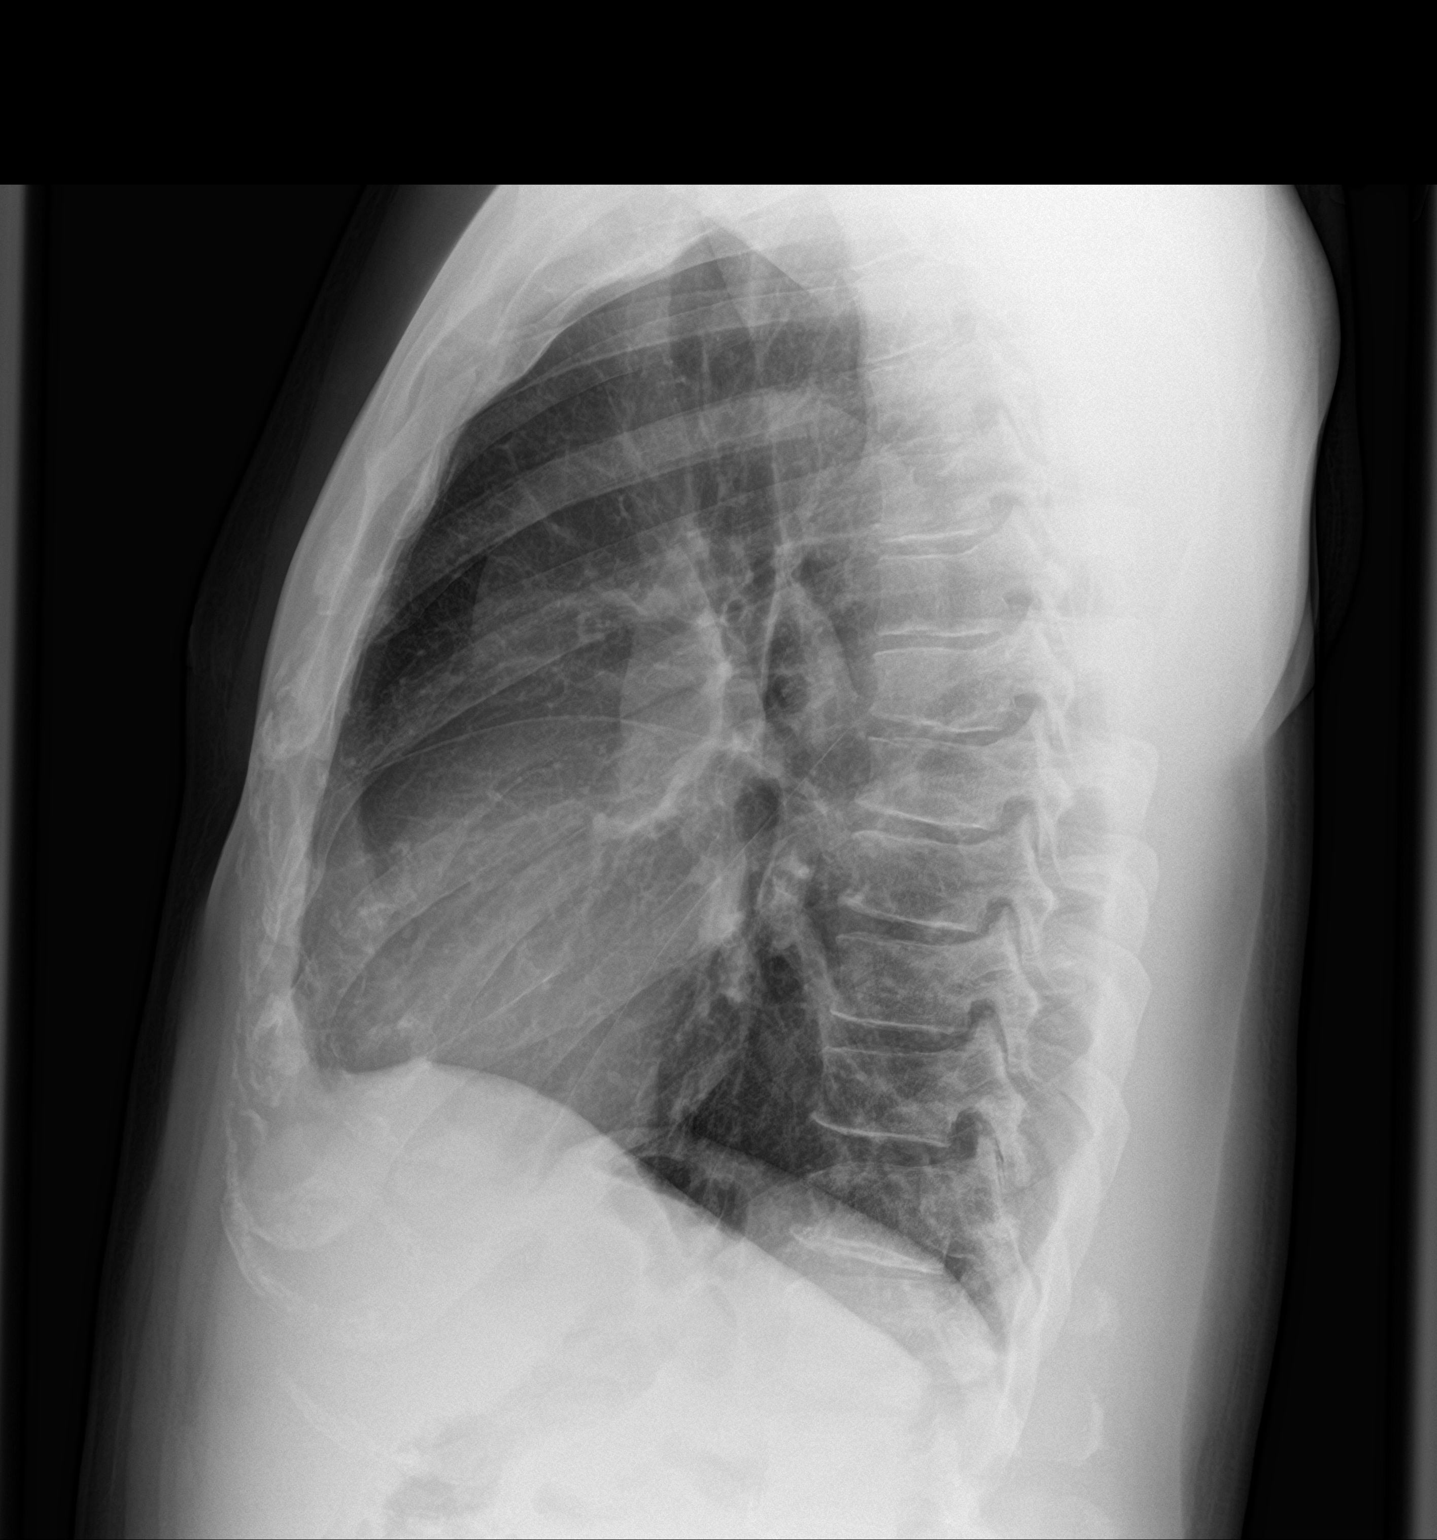

[2 of 2 positions shown; findings below may reference images not displayed]

FINDINGS: Nipple shadows project over both lungs. The lungs are clear. No
pleural effusions. Normal pulmonary vasculature. Normal hilar,
mediastinal and cardiac contours.
IMPRESSION: No acute cardiopulmonary disease.

## 2016-12-30 MED ORDER — METOCLOPRAMIDE HCL 5 MG/ML IJ SOLN
10.0000 mg | Freq: Once | INTRAMUSCULAR | Status: AC
  Administered 2016-12-30: 10 mg via INTRAVENOUS
  Filled 2016-12-30: qty 2

## 2016-12-30 MED ORDER — MAGNESIUM SULFATE 2 GM/50ML IV SOLN
2.0000 g | Freq: Once | INTRAVENOUS | Status: AC
  Administered 2016-12-30: 2 g via INTRAVENOUS
  Filled 2016-12-30: qty 50

## 2016-12-30 MED ORDER — IPRATROPIUM BROMIDE 0.02 % IN SOLN
1.0000 mg | Freq: Once | RESPIRATORY_TRACT | Status: AC
  Administered 2016-12-30: 1 mg via RESPIRATORY_TRACT
  Filled 2016-12-30: qty 5

## 2016-12-30 MED ORDER — ALBUTEROL SULFATE HFA 108 (90 BASE) MCG/ACT IN AERS
2.0000 | INHALATION_SPRAY | Freq: Once | RESPIRATORY_TRACT | Status: AC
  Administered 2016-12-30: 2 via RESPIRATORY_TRACT
  Filled 2016-12-30: qty 6.7

## 2016-12-30 MED ORDER — DIPHENHYDRAMINE HCL 50 MG/ML IJ SOLN
50.0000 mg | Freq: Once | INTRAMUSCULAR | Status: AC
  Administered 2016-12-30: 50 mg via INTRAVENOUS
  Filled 2016-12-30: qty 1

## 2016-12-30 MED ORDER — KETOROLAC TROMETHAMINE 30 MG/ML IJ SOLN
30.0000 mg | Freq: Once | INTRAMUSCULAR | Status: AC
  Administered 2016-12-30: 30 mg via INTRAVENOUS
  Filled 2016-12-30: qty 1

## 2016-12-30 MED ORDER — ALBUTEROL SULFATE (2.5 MG/3ML) 0.083% IN NEBU
5.0000 mg | INHALATION_SOLUTION | Freq: Once | RESPIRATORY_TRACT | Status: AC
  Administered 2016-12-30: 5 mg via RESPIRATORY_TRACT

## 2016-12-30 MED ORDER — AMLODIPINE BESYLATE 10 MG PO TABS: 10.0000 mg | ORAL_TABLET | Freq: Every day | ORAL | 0 refills | Status: DC

## 2016-12-30 MED ORDER — PREDNISONE 20 MG PO TABS: 60.0000 mg | ORAL_TABLET | Freq: Every day | ORAL | 0 refills | Status: DC

## 2016-12-30 MED ORDER — LISINOPRIL 20 MG PO TABS
40.0000 mg | ORAL_TABLET | Freq: Once | ORAL | Status: AC
  Administered 2016-12-30: 40 mg via ORAL
  Filled 2016-12-30: qty 2

## 2016-12-30 MED ORDER — ALBUTEROL (5 MG/ML) CONTINUOUS INHALATION SOLN
10.0000 mg/h | INHALATION_SOLUTION | RESPIRATORY_TRACT | Status: DC
  Administered 2016-12-30: 10 mg/h via RESPIRATORY_TRACT
  Filled 2016-12-30: qty 20

## 2016-12-30 MED ORDER — SODIUM CHLORIDE 0.9 % IV BOLUS (SEPSIS)
1000.0000 mL | Freq: Once | INTRAVENOUS | Status: AC
  Administered 2016-12-30: 1000 mL via INTRAVENOUS

## 2016-12-30 MED ORDER — ALBUTEROL SULFATE (2.5 MG/3ML) 0.083% IN NEBU
INHALATION_SOLUTION | RESPIRATORY_TRACT | Status: AC
  Filled 2016-12-30: qty 6

## 2016-12-30 MED ORDER — LISINOPRIL 40 MG PO TABS: 40.0000 mg | ORAL_TABLET | Freq: Every day | ORAL | 0 refills | Status: DC

## 2016-12-30 MED ORDER — AMLODIPINE BESYLATE 5 MG PO TABS
10.0000 mg | ORAL_TABLET | Freq: Once | ORAL | Status: AC
  Administered 2016-12-30: 10 mg via ORAL
  Filled 2016-12-30: qty 2

## 2016-12-30 MED ORDER — BENZONATATE 100 MG PO CAPS
200.0000 mg | ORAL_CAPSULE | Freq: Once | ORAL | Status: AC
  Administered 2016-12-30: 200 mg via ORAL
  Filled 2016-12-30: qty 2

## 2016-12-30 MED ORDER — PREDNISONE 20 MG PO TABS
60.0000 mg | ORAL_TABLET | Freq: Once | ORAL | Status: AC
  Administered 2016-12-30: 60 mg via ORAL
  Filled 2016-12-30: qty 3

## 2016-12-30 NOTE — ED Triage Notes (Signed)
Pt states recently at detox, complaining of asthma and SOB. Pt states hx of htn, pt hypertensive at triage.

## 2016-12-30 NOTE — ED Provider Notes (Signed)
[facility]-EMERGENCY DEPT Provider Note   CSN: [id number] Arrival date & time: 12/30/16  0029     History   Chief Complaint Chief Complaint  Patient presents with  . Asthma    [name] is a 51 y.o. male.With a past medical history of asthma, bipolar, depression, schizoaffective disorder, presenting today with shortness of breath and wheezing. Patient states this is been going on for the past 3 days. He denies sick contacts or fevers. He's had worsening wheezing and difficulty breathing. He denies chest pain. He states he has associated headache with this which is consistent with his normal headaches. He has not taken any medicine to make this better. There are no further complaints.  10 Systems reviewed and are negative for acute change except as noted in the HPI.    HPI  Past Medical History:  Diagnosis Date  . Asthma   . Bipolar 1 disorder (HCC)   . Depression   . Homelessness   . Hypertension   . Schizoaffective disorder [facility])     Patient Active Problem List   Diagnosis Date Noted  . Asthma 12/26/2016  . Tobacco use disorder 12/26/2016  . Schizophrenia (HCC) 12/26/2016  . Cocaine abuse with cocaine-induced mood disorder (HCC) 09/06/2016  . Alcohol use disorder, mild, abuse 07/31/2016  . Substance induced mood disorder (HCC) 07/30/2016  . Schizoaffective disorder, bipolar type (HCC) 05/31/2016  . Cocaine use disorder, severe, dependence (HCC) 05/31/2016  . Essential hypertension 05/31/2016    History reviewed. No pertinent surgical history.     Home Medications    Prior to Admission medications   Medication Sig Start Date End Date Taking? Authorizing Provider  albuterol (PROVENTIL HFA;VENTOLIN HFA) 108 (90 Base) MCG/ACT inhaler Inhale 1-2 puffs into the lungs every 4 (four) hours as needed for shortness of breath. 12/29/16   Jolanta B Pucilowska, MD  amLODipine (NORVASC) 10 MG tablet Take 1 tablet (10 mg total) by mouth daily. 12/30/16   Tomasita Crumble, MD    lisinopril (PRINIVIL,ZESTRIL) 40 MG tablet Take 1 tablet (40 mg total) by mouth daily. 12/30/16   Tomasita Crumble, MD  predniSONE (DELTASONE) 20 MG tablet Take 3 tablets (60 mg total) by mouth daily. 12/30/16   Tomasita Crumble, MD  QUEtiapine (SEROQUEL) 200 MG tablet Take 1 tablet (200 mg total) by mouth at bedtime. 12/29/16   Shari Prows, MD  traZODone (DESYREL) 150 MG tablet Take 1 tablet (150 mg total) by mouth at bedtime. 12/29/16   Shari Prows, MD    Family History Family History  Problem Relation Age of Onset  . Drug abuse Mother   . Suicidality Mother     Social History Social History  Substance Use Topics  . Smoking status: Current Every Day Smoker    Types: Cigarettes  . Smokeless tobacco: Never Used  . Alcohol use Yes     Comment: infrequently     Allergies   Tylenol [acetaminophen]   Review of Systems Review of Systems   Physical Exam Updated Vital Signs BP (!) 150/106   Pulse 68   Temp 97.9 F (36.6 C) (Oral)   Resp 16   Ht 5\' 9"  (1.753 m)   Wt 180 lb (81.6 kg)   SpO2 100%   BMI 26.58 kg/m   Physical Exam  Constitutional: He is oriented to person, place, and time. Vital signs are normal. He appears well-developed and well-nourished.  Non-toxic appearance. He does not appear ill. No distress.  HENT:  Head: Normocephalic and atraumatic.  Nose: Nose normal.  Mouth/Throat: Oropharynx is clear and moist. No oropharyngeal exudate.  Eyes: Conjunctivae and EOM are normal. Pupils are equal, round, and reactive to light. No scleral icterus.  Neck: Normal range of motion. Neck supple. No tracheal deviation, no edema, no erythema and normal range of motion present. No thyroid mass and no thyromegaly present.  Cardiovascular: Normal rate, regular rhythm, S1 normal, S2 normal, normal heart sounds, intact distal pulses and normal pulses.  Exam reveals no gallop and no friction rub.   No murmur heard. Pulmonary/Chest: Effort normal. No respiratory distress.  He has wheezes. He has no rhonchi. He has no rales.  Abdominal: Soft. Normal appearance and bowel sounds are normal. He exhibits no distension, no ascites and no mass. There is no hepatosplenomegaly. There is no tenderness. There is no rebound, no guarding and no CVA tenderness.  Musculoskeletal: Normal range of motion. He exhibits no edema or tenderness.  Lymphadenopathy:    He has no cervical adenopathy.  Neurological: He is alert and oriented to person, place, and time. He has normal strength. No cranial nerve deficit or sensory deficit.  Skin: Skin is warm, dry and intact. No petechiae and no rash noted. He is not diaphoretic. No erythema. No pallor.  Nursing note and vitals reviewed.    ED Treatments / Results  Labs (all labs ordered are listed, but only abnormal results are displayed) Labs Reviewed - No data to display  EKG  EKG Interpretation None       Radiology Dg Chest 2 View  Result Date: 12/30/2016 CLINICAL DATA:  Dyspnea. EXAM: CHEST  2 VIEW COMPARISON:  12/27/2016 FINDINGS: Nipple shadows project over both lungs. The lungs are clear. No pleural effusions. Normal pulmonary vasculature. Normal hilar, mediastinal and cardiac contours. IMPRESSION: No acute cardiopulmonary disease. Electronically Signed   By: Ellery Plunk M.D.   On: 12/30/2016 01:14    Procedures Procedures (including critical care time)  Medications Ordered in ED Medications  albuterol (PROVENTIL,VENTOLIN) solution continuous neb (10 mg/hr Nebulization New Bag/Given 12/30/16 0558)  magnesium sulfate IVPB 2 g 50 mL (2 g Intravenous New Bag/Given 12/30/16 0547)  albuterol (PROVENTIL HFA;VENTOLIN HFA) 108 (90 Base) MCG/ACT inhaler 2 puff (not administered)  albuterol (PROVENTIL) (2.5 MG/3ML) 0.083% nebulizer solution 5 mg (5 mg Nebulization Given 12/30/16 0041)  amLODipine (NORVASC) tablet 10 mg (10 mg Oral Given 12/30/16 0546)  lisinopril (PRINIVIL,ZESTRIL) tablet 40 mg (40 mg Oral Given 12/30/16 0546)    sodium chloride 0.9 % bolus 1,000 mL (1,000 mLs Intravenous New Bag/Given 12/30/16 0550)  ketorolac (TORADOL) 30 MG/ML injection 30 mg (30 mg Intravenous Given 12/30/16 0547)  metoCLOPramide (REGLAN) injection 10 mg (10 mg Intravenous Given 12/30/16 0547)  diphenhydrAMINE (BENADRYL) injection 50 mg (50 mg Intravenous Given 12/30/16 0547)  ipratropium (ATROVENT) nebulizer solution 1 mg (1 mg Nebulization Given 12/30/16 0558)  benzonatate (TESSALON) capsule 200 mg (200 mg Oral Given 12/30/16 0548)  predniSONE (DELTASONE) tablet 60 mg (60 mg Oral Given 12/30/16 0548)     Initial Impression / Assessment and Plan / ED Course  I have reviewed the triage vital signs and the nursing notes.  Pertinent labs & imaging results that were available during my care of the patient were reviewed by me and considered in my medical decision making (see chart for details).     Patient presents emergency department for wheezing. Chest x-rays negative for pneumonia. He was given albuterol, ipratropium, prednisone, magnesium for treatment. Also IV fluids. Will refill his high blood pressure medication,  he is asymptomatic from this value. Also given albuterol inhaler to go home with. Primary care follow-up advised in 3 days. He appears well in no acute distress, vital signs were within his normal limits and is safe for discharge.  Final Clinical Impressions(s) / ED Diagnoses   Final diagnoses:  Viral upper respiratory tract infection    New Prescriptions New Prescriptions   AMLODIPINE (NORVASC) 10 MG TABLET    Take 1 tablet (10 mg total) by mouth daily.   LISINOPRIL (PRINIVIL,ZESTRIL) 40 MG TABLET    Take 1 tablet (40 mg total) by mouth daily.   PREDNISONE (DELTASONE) 20 MG TABLET    Take 3 tablets (60 mg total) by mouth daily.     Tomasita Crumble, MD 12/30/16 [phone]

## 2017-05-26 DIAGNOSIS — R45851 Suicidal ideations: Secondary | ICD-10-CM

## 2017-05-26 DIAGNOSIS — J189 Pneumonia, unspecified organism: Secondary | ICD-10-CM

## 2017-05-26 DIAGNOSIS — R042 Hemoptysis: Secondary | ICD-10-CM

## 2017-05-27 DIAGNOSIS — R042 Hemoptysis: Secondary | ICD-10-CM | POA: Diagnosis not present

## 2017-05-27 DIAGNOSIS — R45851 Suicidal ideations: Secondary | ICD-10-CM | POA: Diagnosis not present

## 2017-05-27 DIAGNOSIS — J189 Pneumonia, unspecified organism: Secondary | ICD-10-CM | POA: Diagnosis not present

## 2017-05-28 DIAGNOSIS — J189 Pneumonia, unspecified organism: Secondary | ICD-10-CM | POA: Diagnosis not present

## 2017-05-28 DIAGNOSIS — R042 Hemoptysis: Secondary | ICD-10-CM | POA: Diagnosis not present

## 2017-05-28 DIAGNOSIS — R45851 Suicidal ideations: Secondary | ICD-10-CM | POA: Diagnosis not present

## 2017-05-29 DIAGNOSIS — R042 Hemoptysis: Secondary | ICD-10-CM | POA: Diagnosis not present

## 2017-05-29 DIAGNOSIS — R45851 Suicidal ideations: Secondary | ICD-10-CM | POA: Diagnosis not present

## 2017-05-29 DIAGNOSIS — J189 Pneumonia, unspecified organism: Secondary | ICD-10-CM | POA: Diagnosis not present

## 2017-05-30 DIAGNOSIS — J189 Pneumonia, unspecified organism: Secondary | ICD-10-CM | POA: Diagnosis not present

## 2017-05-30 DIAGNOSIS — R042 Hemoptysis: Secondary | ICD-10-CM | POA: Diagnosis not present

## 2017-05-30 DIAGNOSIS — R45851 Suicidal ideations: Secondary | ICD-10-CM | POA: Diagnosis not present

## 2017-05-31 DIAGNOSIS — R45851 Suicidal ideations: Secondary | ICD-10-CM | POA: Diagnosis not present

## 2017-05-31 DIAGNOSIS — R042 Hemoptysis: Secondary | ICD-10-CM | POA: Diagnosis not present

## 2017-05-31 DIAGNOSIS — J189 Pneumonia, unspecified organism: Secondary | ICD-10-CM | POA: Diagnosis not present

## 2017-06-01 DIAGNOSIS — J189 Pneumonia, unspecified organism: Secondary | ICD-10-CM | POA: Diagnosis not present

## 2017-06-01 DIAGNOSIS — R45851 Suicidal ideations: Secondary | ICD-10-CM | POA: Diagnosis not present

## 2017-06-01 DIAGNOSIS — R042 Hemoptysis: Secondary | ICD-10-CM | POA: Diagnosis not present

## 2017-06-02 DIAGNOSIS — R45851 Suicidal ideations: Secondary | ICD-10-CM | POA: Diagnosis not present

## 2017-06-02 DIAGNOSIS — J189 Pneumonia, unspecified organism: Secondary | ICD-10-CM | POA: Diagnosis not present

## 2017-06-02 DIAGNOSIS — R042 Hemoptysis: Secondary | ICD-10-CM | POA: Diagnosis not present

## 2017-06-03 DIAGNOSIS — J189 Pneumonia, unspecified organism: Secondary | ICD-10-CM | POA: Diagnosis not present

## 2017-06-03 DIAGNOSIS — R042 Hemoptysis: Secondary | ICD-10-CM | POA: Diagnosis not present

## 2017-06-03 DIAGNOSIS — R45851 Suicidal ideations: Secondary | ICD-10-CM | POA: Diagnosis not present

## 2017-08-28 DIAGNOSIS — J4520 Mild intermittent asthma, uncomplicated: Secondary | ICD-10-CM | POA: Insufficient documentation

## 2018-02-13 ENCOUNTER — Encounter (HOSPITAL_COMMUNITY): Payer: Self-pay | Admitting: *Deleted

## 2018-02-13 ENCOUNTER — Emergency Department (HOSPITAL_COMMUNITY)
Admission: EM | Admit: 2018-02-13 | Discharge: 2018-02-14 | Disposition: A | Discharge status: Home / Self Care | Payer: Medicaid Other | Attending: Emergency Medicine | Admitting: Emergency Medicine

## 2018-02-13 ENCOUNTER — Other Ambulatory Visit: Payer: Self-pay

## 2018-02-13 DIAGNOSIS — Z79899 Other long term (current) drug therapy: Secondary | ICD-10-CM | POA: Diagnosis not present

## 2018-02-13 DIAGNOSIS — J45909 Unspecified asthma, uncomplicated: Secondary | ICD-10-CM | POA: Diagnosis not present

## 2018-02-13 DIAGNOSIS — F17210 Nicotine dependence, cigarettes, uncomplicated: Secondary | ICD-10-CM | POA: Diagnosis not present

## 2018-02-13 DIAGNOSIS — R45851 Suicidal ideations: Secondary | ICD-10-CM | POA: Diagnosis not present

## 2018-02-13 DIAGNOSIS — I10 Essential (primary) hypertension: Secondary | ICD-10-CM | POA: Insufficient documentation

## 2018-02-13 DIAGNOSIS — K029 Dental caries, unspecified: Secondary | ICD-10-CM | POA: Insufficient documentation

## 2018-02-13 DIAGNOSIS — F329 Major depressive disorder, single episode, unspecified: Secondary | ICD-10-CM | POA: Insufficient documentation

## 2018-02-13 DIAGNOSIS — K0889 Other specified disorders of teeth and supporting structures: Secondary | ICD-10-CM | POA: Diagnosis present

## 2018-02-13 LAB — COMPREHENSIVE METABOLIC PANEL
ALBUMIN: 4.4 g/dL (ref 3.5–5.0)
ALK PHOS: 60 U/L (ref 38–126)
ALT: 51 U/L (ref 17–63)
AST: 73 U/L — AB (ref 15–41)
Anion gap: 14 (ref 5–15)
BUN: 16 mg/dL (ref 6–20)
CALCIUM: 9.1 mg/dL (ref 8.9–10.3)
CO2: 20 mmol/L — AB (ref 22–32)
Chloride: 100 mmol/L — ABNORMAL LOW (ref 101–111)
Creatinine, Ser: 1.09 mg/dL (ref 0.61–1.24)
GFR calc Af Amer: >60 mL/min (ref >60)
GFR calc non Af Amer: >60 mL/min (ref >60)
GLUCOSE: 139 mg/dL — AB (ref 65–99)
Potassium: 4.2 mmol/L (ref 3.5–5.1)
Sodium: 134 mmol/L — ABNORMAL LOW (ref 135–145)
Total Bilirubin: 1.1 mg/dL (ref 0.3–1.2)
Total Protein: 7.4 g/dL (ref 6.5–8.1)

## 2018-02-13 LAB — RAPID URINE DRUG SCREEN, HOSP PERFORMED
Amphetamines: NOT DETECTED
BARBITURATES: NOT DETECTED
BENZODIAZEPINES: NOT DETECTED
Cocaine: POSITIVE — AB
Opiates: NOT DETECTED
TETRAHYDROCANNABINOL: NOT DETECTED

## 2018-02-13 LAB — CBC
HEMATOCRIT: 40.3 % (ref 39.0–52.0)
HEMOGLOBIN: 14.2 g/dL (ref 13.0–17.0)
MCH: 33.4 pg (ref 26.0–34.0)
MCHC: 35.2 g/dL (ref 30.0–36.0)
MCV: 94.8 fL (ref 78.0–100.0)
Platelets: 191 10*3/uL (ref 150–400)
RBC: 4.25 MIL/uL (ref 4.22–5.81)
RDW: 13.6 % (ref 11.5–15.5)
WBC: 7.6 10*3/uL (ref 4.0–10.5)

## 2018-02-13 LAB — ETHANOL: Alcohol, Ethyl (B): 43 mg/dL — ABNORMAL HIGH (ref <10)

## 2018-02-13 LAB — ACETAMINOPHEN LEVEL: Acetaminophen (Tylenol), Serum: <10 ug/mL — ABNORMAL LOW (ref 10–30)

## 2018-02-13 LAB — SALICYLATE LEVEL: Salicylate Lvl: <7 mg/dL (ref 2.8–30.0)

## 2018-02-13 MED ORDER — AMLODIPINE BESYLATE 5 MG PO TABS
10.0000 mg | ORAL_TABLET | Freq: Every day | ORAL | Status: DC
  Administered 2018-02-13: 10 mg via ORAL
  Filled 2018-02-13: qty 2

## 2018-02-13 MED ORDER — NAPROXEN 250 MG PO TABS
500.0000 mg | ORAL_TABLET | Freq: Two times a day (BID) | ORAL | Status: DC
  Administered 2018-02-13: 500 mg via ORAL
  Filled 2018-02-13 (×2): qty 2

## 2018-02-13 MED ORDER — ALBUTEROL SULFATE HFA 108 (90 BASE) MCG/ACT IN AERS: 1.0000 | INHALATION_SPRAY | RESPIRATORY_TRACT | Status: DC | PRN

## 2018-02-13 MED ORDER — ONDANSETRON 4 MG PO TBDP: 4.0000 mg | ORAL_TABLET | Freq: Four times a day (QID) | ORAL | Status: DC | PRN

## 2018-02-13 MED ORDER — CLONIDINE HCL 0.2 MG PO TABS: 0.1000 mg | ORAL_TABLET | Freq: Every day | ORAL | Status: DC

## 2018-02-13 MED ORDER — DICYCLOMINE HCL 20 MG PO TABS: 20.0000 mg | ORAL_TABLET | Freq: Four times a day (QID) | ORAL | Status: DC | PRN

## 2018-02-13 MED ORDER — NAPROXEN 250 MG PO TABS: 500.0000 mg | ORAL_TABLET | Freq: Two times a day (BID) | ORAL | Status: DC | PRN

## 2018-02-13 MED ORDER — LORAZEPAM 2 MG/ML IJ SOLN
0.0000 mg | Freq: Two times a day (BID) | INTRAMUSCULAR | Status: DC
Start: 2018-02-15 — End: 2018-02-14

## 2018-02-13 MED ORDER — THIAMINE HCL 100 MG/ML IJ SOLN: 100.0000 mg | Freq: Every day | INTRAMUSCULAR | Status: DC

## 2018-02-13 MED ORDER — LORAZEPAM 2 MG/ML IJ SOLN: 0.0000 mg | Freq: Four times a day (QID) | INTRAMUSCULAR | Status: DC

## 2018-02-13 MED ORDER — TRAZODONE HCL 50 MG PO TABS: 150.0000 mg | ORAL_TABLET | Freq: Every day | ORAL | Status: DC

## 2018-02-13 MED ORDER — LORAZEPAM 1 MG PO TABS: 0.0000 mg | ORAL_TABLET | Freq: Two times a day (BID) | ORAL | Status: DC

## 2018-02-13 MED ORDER — LORAZEPAM 1 MG PO TABS: 0.0000 mg | ORAL_TABLET | Freq: Four times a day (QID) | ORAL | Status: DC

## 2018-02-13 MED ORDER — LISINOPRIL 20 MG PO TABS
40.0000 mg | ORAL_TABLET | Freq: Every day | ORAL | Status: DC
  Administered 2018-02-13: 40 mg via ORAL
  Filled 2018-02-13: qty 2

## 2018-02-13 MED ORDER — LOPERAMIDE HCL 2 MG PO CAPS: 2.0000 mg | ORAL_CAPSULE | ORAL | Status: DC | PRN

## 2018-02-13 MED ORDER — IBUPROFEN 400 MG PO TABS
400.0000 mg | ORAL_TABLET | Freq: Once | ORAL | Status: AC
  Administered 2018-02-13: 400 mg via ORAL
  Filled 2018-02-13: qty 1

## 2018-02-13 MED ORDER — QUETIAPINE FUMARATE 200 MG PO TABS
200.0000 mg | ORAL_TABLET | Freq: Every day | ORAL | Status: DC
  Administered 2018-02-13: 200 mg via ORAL
  Filled 2018-02-13: qty 1

## 2018-02-13 MED ORDER — HYDROXYZINE HCL 25 MG PO TABS: 25.0000 mg | ORAL_TABLET | Freq: Four times a day (QID) | ORAL | Status: DC | PRN

## 2018-02-13 MED ORDER — METHOCARBAMOL 500 MG PO TABS: 500.0000 mg | ORAL_TABLET | Freq: Three times a day (TID) | ORAL | Status: DC | PRN

## 2018-02-13 MED ORDER — NAPROXEN 250 MG PO TABS
500.0000 mg | ORAL_TABLET | Freq: Once | ORAL | Status: AC
  Administered 2018-02-13: 500 mg via ORAL
  Filled 2018-02-13: qty 2

## 2018-02-13 MED ORDER — CLONIDINE HCL 0.2 MG PO TABS: 0.1000 mg | ORAL_TABLET | Freq: Four times a day (QID) | ORAL | Status: DC

## 2018-02-13 MED ORDER — CLONIDINE HCL 0.2 MG PO TABS: 0.1000 mg | ORAL_TABLET | ORAL | Status: DC

## 2018-02-13 MED ORDER — VITAMIN B-1 100 MG PO TABS
100.0000 mg | ORAL_TABLET | Freq: Every day | ORAL | Status: DC
  Administered 2018-02-13: 100 mg via ORAL
  Filled 2018-02-13: qty 1

## 2018-02-13 NOTE — ED Notes (Signed)
Pt ambulated to bathroom then back to room. Pt asked "Did that lady go get my sandwich?" Advised pt 1500 was snack time and he was given snack then. Pt appeared to become upset and stated "Oh, ok then". Advised pt as he was walking from desk that he may be given sandwich when his dinner tray arrives. Pt did not acknowledge and returned to room - laid down on bed - watching tv.

## 2018-02-13 NOTE — ED Notes (Signed)
Belongings placed in locker 5 and security, see flowsheet.

## 2018-02-13 NOTE — ED Notes (Signed)
Per Staffing Office, no Sitter available for pt - being observed by staff.

## 2018-02-13 NOTE — Progress Notes (Signed)
Received message for medication recommendations. Chart reviewed. Noted that patient has Seroquel 200 mg QHS and Trazodone 150 mg QHS ordered to start tonight. Patient is also on Ativan detox protocol. Recommend continuing these medications at this time. Attempted to call nurse at [phone], no answer.

## 2018-02-13 NOTE — Progress Notes (Signed)
TTS consult ordered for pt who is in the hallway. TTS unable to complete the assessment in the hallway and pt must be moved to a private room. Nursing staff advised to call TTS once the pt has been moved to a private room for the assessment.   Princess Bruins, MSW, [name] Therapeutic Triage Specialist  [phone]

## 2018-02-13 NOTE — ED Notes (Addendum)
Pt given sandwich and Sprite - continues to refuse to have VS's taken. Pt attempted to close the door on RN as RN was trying to advise pt of standard of work for [employer] - including snack times. Pt stated "Just leave my room and leave me alone". States "I only want to see your supervisor!".

## 2018-02-13 NOTE — ED Notes (Addendum)
Pt refusing to have VS's performed. Will re-attempt after pt eats dinner.

## 2018-02-13 NOTE — ED Notes (Signed)
ORDERED DIET TRAY FOR PT  

## 2018-02-13 NOTE — ED Notes (Addendum)
Sitter arrived to bedside. Pt attempting to close door to room d/t states bright light in hallway bothering him. RN and Sitter advised door is to remain open - voiced understanding. Pt noted to be wearing burgundy scrubs - ambulatory to Pod F8 w/NT.

## 2018-02-13 NOTE — ED Notes (Addendum)
Offered pt sandwich - states he wants to talk to the supervisor and does not want anything. Pt then came out of his room at approx 5 min later asking for Sprite and sandwich as he is ambulating to bathroom. Requested to take pt's VS's. Pt stated "You've been telling me 'No' all day. This goes 2 ways". Pt was only in bathroom momentarily then returned to his exam room. Johnston Ebbs, Consulting civil engineer, aware.

## 2018-02-13 NOTE — Progress Notes (Signed)
TTS consulted with Nira Conn,  NP who recommends inpt treatment. [facility] currently reviewing for possible admission. Pt's nurse Irving Burton, RN has been advised of the disposition. EDP Rancour, Jeannett Senior, MD also notified of the disposition.   Princess Bruins, MSW, [name] Therapeutic Triage Specialist  [phone]

## 2018-02-13 NOTE — ED Notes (Addendum)
Lying on bed watching tv. 

## 2018-02-13 NOTE — ED Notes (Signed)
Johnston Ebbs, Consulting civil engineer, attempted to speak w/pt. States he does not want to speak to her - he wants to speak w/House Merchandiser, retail.

## 2018-02-13 NOTE — ED Provider Notes (Signed)
[name] EMERGENCY DEPARTMENT Provider Note   CSN: [id number] Arrival date & time: 02/13/18  0120     History   Chief Complaint Chief Complaint  Patient presents with  . Suicidal  . Dental Pain    [name] is a 52 y.o. male.  Patient states he has had left lower dental pain for the past several days.  He is has bad teeth in general denies any acute injury.  No fever, chills, nausea or vomiting.  No difficulty breathing or difficulty swallowing.  No chest pain or shortness of breath.  Patient reports he has ongoing suicidal ideations with plan to slit his wrists.  He has been abusing cocaine last used 2 days ago as well as alcohol.  Denies any injection drug use or pill use.  States he is supposed to be taking Seroquel for schizoaffective disorder but it is "too strong".  He denies any homicidal ideation.  He states he is hearing voices telling him to hurt himself.  He states compliance with his other medications.   The history is provided by the patient.  Dental Pain      Past Medical History:  Diagnosis Date  . Asthma   . Bipolar 1 disorder (HCC)   . Depression   . Homelessness   . Hypertension   . Schizoaffective disorder [facility])     Patient Active Problem List   Diagnosis Date Noted  . Asthma 12/26/2016  . Tobacco use disorder 12/26/2016  . Schizophrenia (HCC) 12/26/2016  . Cocaine abuse with cocaine-induced mood disorder (HCC) 09/06/2016  . Alcohol use disorder, mild, abuse 07/31/2016  . Substance induced mood disorder (HCC) 07/30/2016  . Schizoaffective disorder, bipolar type (HCC) 05/31/2016  . Cocaine use disorder, severe, dependence (HCC) 05/31/2016  . Essential hypertension 05/31/2016    History reviewed. No pertinent surgical history.     Home Medications    Prior to Admission medications   Medication Sig Start Date End Date Taking? Authorizing Provider  albuterol (PROVENTIL HFA;VENTOLIN HFA) 108 (90 Base) MCG/ACT  inhaler Inhale 1-2 puffs into the lungs every 4 (four) hours as needed for shortness of breath. 12/29/16   Pucilowska, Jolanta B, MD  amLODipine (NORVASC) 10 MG tablet Take 1 tablet (10 mg total) by mouth daily. 12/30/16   Tomasita Crumble, MD  lisinopril (PRINIVIL,ZESTRIL) 40 MG tablet Take 1 tablet (40 mg total) by mouth daily. 12/30/16   Tomasita Crumble, MD  predniSONE (DELTASONE) 20 MG tablet Take 3 tablets (60 mg total) by mouth daily. 12/30/16   Tomasita Crumble, MD  QUEtiapine (SEROQUEL) 200 MG tablet Take 1 tablet (200 mg total) by mouth at bedtime. 12/29/16   Pucilowska, Braulio Conte B, MD  traZODone (DESYREL) 150 MG tablet Take 1 tablet (150 mg total) by mouth at bedtime. 12/29/16   Shari Prows, MD    Family History Family History  Problem Relation Age of Onset  . Drug abuse Mother   . Suicidality Mother     Social History Social History   Tobacco Use  . Smoking status: Current Every Day Smoker    Types: Cigarettes  . Smokeless tobacco: Never Used  Substance Use Topics  . Alcohol use: Yes    Comment: infrequently  . Drug use: Yes    Types: Cocaine     Allergies   Tylenol [acetaminophen]   Review of Systems Review of Systems  Constitutional: Negative for activity change, appetite change and fever.  HENT: Positive for dental problem. Negative for facial swelling,  sinus pressure, sinus pain and sore throat.   Eyes: Negative for visual disturbance.  Respiratory: Negative for cough and shortness of breath.   Cardiovascular: Negative for chest pain.  Gastrointestinal: Negative for abdominal pain.  Genitourinary: Negative for dysuria and hematuria.  Musculoskeletal: Negative for arthralgias, back pain and myalgias.  Psychiatric/Behavioral: Positive for behavioral problems, decreased concentration, dysphoric mood, hallucinations, sleep disturbance and suicidal ideas. The patient is nervous/anxious.    all other systems are negative except as noted in the HPI and PMH.      Physical Exam Updated Vital Signs BP (!) 179/119 (BP Location: Right Arm)   Pulse 81   Temp 97.6 F (36.4 C) (Oral)   Resp 17   Ht 5\' 9"  (1.753 m)   Wt 86.2 kg (190 lb)   SpO2 100%   BMI 28.06 kg/m   Physical Exam  Constitutional: He is oriented to person, place, and time. He appears well-developed and well-nourished. No distress.  Flat and angry affect  HENT:  Head: Normocephalic and atraumatic.  Mouth/Throat: Oropharynx is clear and moist. No oropharyngeal exudate.  Multiple missing teeth, poor dentition throughout Left lower molar is eroded with large caries. Floor mouth is soft.  No abscess  Eyes: Conjunctivae and EOM are normal. Pupils are equal, round, and reactive to light.  Neck: Normal range of motion. Neck supple.  No meningismus.  Cardiovascular: Normal rate, regular rhythm, normal heart sounds and intact distal pulses.  No murmur heard. Pulmonary/Chest: Effort normal and breath sounds normal. No respiratory distress.  Abdominal: Soft. There is no tenderness. There is no rebound and no guarding.  Musculoskeletal: Normal range of motion. He exhibits no edema or tenderness.  Neurological: He is alert and oriented to person, place, and time. No cranial nerve deficit. He exhibits normal muscle tone. Coordination normal.  No ataxia on finger to nose bilaterally. No pronator drift. 5/5 strength throughout. CN 2-12 intact.Equal grip strength. Sensation intact.   Skin: Skin is warm.  Psychiatric: He has a normal mood and affect. His behavior is normal.  Nursing note and vitals reviewed.    ED Treatments / Results  Labs (all labs ordered are listed, but only abnormal results are displayed) Labs Reviewed  COMPREHENSIVE METABOLIC PANEL - Abnormal; Notable for the following components:      Result Value   Sodium 134 (*)    Chloride 100 (*)    CO2 20 (*)    Glucose, Bld 139 (*)    AST 73 (*)    All other components within normal limits  ETHANOL - Abnormal;  Notable for the following components:   Alcohol, Ethyl (B) 43 (*)    All other components within normal limits  ACETAMINOPHEN LEVEL - Abnormal; Notable for the following components:   Acetaminophen (Tylenol), Serum <10 (*)    All other components within normal limits  RAPID URINE DRUG SCREEN, HOSP PERFORMED - Abnormal; Notable for the following components:   Cocaine POSITIVE (*)    All other components within normal limits  SALICYLATE LEVEL  CBC    EKG  EKG Interpretation None       Radiology No results found.  Procedures Procedures (including critical care time)  Medications Ordered in ED Medications - No data to display   Initial Impression / Assessment and Plan / ED Course  I have reviewed the triage vital signs and the nursing notes.  Pertinent labs & imaging results that were available during my care of the patient were reviewed by me and  considered in my medical decision making (see chart for details).     Patient with dental pain.  No evidence of Ludwig's angina or abscess. He also complains of being suicidal.   Patient is medically clear for psychiatric evaluation.  Screening labs are unremarkable.  Drug screen positive for cocaine.  Patient remains hypertensive likely secondary to cocaine use.  Questionable compliance with home BP meds.  These are ordered.  TTS consult completed.  Patient meets inpatient criteria.  Holding orders placed. Final Clinical Impressions(s) / ED Diagnoses   Final diagnoses:  Suicidal ideation  Pain due to dental caries    ED Discharge Orders    None       Ranon Coven, Jeannett Senior, MD 02/13/18 [phone]

## 2018-02-13 NOTE — BH Assessment (Addendum)
Tele Assessment Note   Patient Name: [name] MRN: [id number] Referring Physician: Glynn Octave, MD Location of Patient: [facility] Location of Provider: Behavioral Health TTS Department  [name] is an 52 y.o. male who presents to the ED voluntarily due to suicidal thoughts with a plan to cut his wrists. Pt states he has attempted suicide in the past and recently, his AH have been telling him to kill himself. Pt states "I came this close to acting on it." Pt has a hx of inpt admissions c/o similar concerns.   Pt states he is homeless, unemployed, receiving disability, and abusing substances. Pt states he abuses cocaine and alcohol frequently. Pt states he is connected to [employer] and he is currently attempting to establish an OPT provider. Pt states he is prescribed Seroquel (300mg  AM and PM) but states he does not take it because he does not like the way it makes him feel.  Pt irritable throughout the assessment and when asked about family hx pt said "I don't know anything about my family." Pt was asked if he has experienced any hx of abuse or neglect and pt "no, none of that ma'am." Pt continued holding his face throughout the assessment. Per chart, pt has been dealing with severe dental pain.   TTS consulted with Nira Conn,  NP who recommends inpt treatment. [facility] currently reviewing for possible admission. Pt's nurse Irving Burton, RN has been advised of the disposition. EDP Rancour, Jeannett Senior, MD also notified of the disposition.   Diagnosis: Schizoaffective disorder; Cocaine use disorder, severe; Alcohol use disorder, severe   Past Medical History:  Past Medical History:  Diagnosis Date  . Asthma   . Bipolar 1 disorder (HCC)   . Depression   . Homelessness   . Hypertension   . Schizoaffective disorder (HCC)     History reviewed. No pertinent surgical history.  Family History:  Family History  Problem Relation Age of Onset  . Drug abuse Mother   . Suicidality  Mother     Social History:  reports that he has been smoking cigarettes.  he has never used smokeless tobacco. He reports that he drinks alcohol. He reports that he uses drugs. Drug: Cocaine.  Additional Social History:  Alcohol / Drug Use Pain Medications: See MAR Prescriptions: See MAR Over the Counter: See MAR History of alcohol / drug use?: Yes Longest period of sobriety (when/how long): 6.5 years Substance #1 Name of Substance 1: Cocaine  1 - Age of First Use: teenager 1 - Amount (size/oz): 10-15 lines 1 - Frequency: pt states "every so often" 1 - Duration: ongoing 1 - Last Use / Amount: 02/12/18 Substance #2 Name of Substance 2: Alcohol 2 - Age of First Use: teenager 2 - Amount (size/oz): vaires 2 - Frequency: occasional 2 - Duration: ongoing 2 - Last Use / Amount: 02/12/18  CIWA: CIWA-Ar BP: (!) 170/107 Pulse Rate: 76 Nausea and Vomiting: no nausea and no vomiting Tactile Disturbances: none Tremor: no tremor Auditory Disturbances: moderately severe hallucinations Paroxysmal Sweats: no sweat visible Visual Disturbances: not present Anxiety: no anxiety, at ease Headache, Fullness in Head: none present Agitation: normal activity Orientation and Clouding of Sensorium: oriented and can do serial additions CIWA-Ar Total: 4 COWS:    Allergies:  Allergies  Allergen Reactions  . Tylenol [Acetaminophen] Nausea And Vomiting    Home Medications:  (Not in a hospital admission)  OB/GYN Status:  No LMP for male patient.  General Assessment Data Assessment unable to be completed: Yes Reason  for not completing assessment: TTS consult ordered for pt who is in the hallway. TTS unable to complete the assessment in the hallway and pt must be moved to a private room. Nursing staff advised to call TTS once the pt has been moved to a private room for the assessment.  Location of Assessment: [facility] ED TTS Assessment: In system Is this a Tele or Face-to-Face Assessment?: Tele  Assessment Is this an Initial Assessment or a Re-assessment for this encounter?: Initial Assessment Marital status: Single Is patient pregnant?: No Pregnancy Status: No Living Arrangements: Other (Comment)(homeless) Can pt return to current living arrangement?: Yes Admission Status: Voluntary Is patient capable of signing voluntary admission?: Yes Referral Source: Self/Family/Friend Insurance type: none on file      Crisis Care Plan Living Arrangements: Other (Comment)(homeless) Name of Psychiatrist: Carollee Massed, MD Name of Therapist: none  Education Status Is patient currently in school?: No Is the patient employed, unemployed or receiving disability?: Receiving disability income  Risk to self with the past 6 months Suicidal Ideation: Yes-Currently Present Has patient been a risk to self within the past 6 months prior to admission? : No Suicidal Intent: No-Not Currently/Within Last 6 Months Has patient had any suicidal intent within the past 6 months prior to admission? : No Is patient at risk for suicide?: Yes Suicidal Plan?: Yes-Currently Present Has patient had any suicidal plan within the past 6 months prior to admission? : Yes Specify Current Suicidal Plan: pt states he wants to cut his wrists  Access to Means: Yes Specify Access to Suicidal Means: pt has access to sharps that could be used to cut himself  What has been your use of drugs/alcohol within the last 12 months?: reports to recent cocaine and alcohol use  Previous Attempts/Gestures: Yes How many times?: (multiple) Triggers for Past Attempts: Unpredictable, Hallucinations Intentional Self Injurious Behavior: Cutting Comment - Self Injurious Behavior: pt reports a hx of self-harm 1 year ago  Family Suicide History: Unknown Recent stressful life event(s): Financial Problems, Other (Comment)(AH w/ command, homeless) Persecutory voices/beliefs?: Yes Depression: Yes Depression Symptoms: Isolating, Loss of  interest in usual pleasures, Feeling worthless/self pity, Feeling angry/irritable Substance abuse history and/or treatment for substance abuse?: Yes Suicide prevention information given to non-admitted patients: Not applicable  Risk to Others within the past 6 months Homicidal Ideation: No Does patient have any lifetime risk of violence toward others beyond the six months prior to admission? : No Thoughts of Harm to Others: No Current Homicidal Intent: No Current Homicidal Plan: No Access to Homicidal Means: No History of harm to others?: No Assessment of Violence: None Noted Does patient have access to weapons?: No Criminal Charges Pending?: No Does patient have a court date: No Is patient on probation?: No  Psychosis Hallucinations: Auditory, With command Delusions: None noted  Mental Status Report Appearance/Hygiene: In scrubs Eye Contact: Fair Motor Activity: Freedom of movement Speech: Logical/coherent Level of Consciousness: Alert, Irritable Mood: Depressed, Helpless, Irritable Affect: Depressed, Irritable Anxiety Level: None Thought Processes: Relevant, Coherent Judgement: Impaired Orientation: Person, Place, Time, Appropriate for developmental age, Situation Obsessive Compulsive Thoughts/Behaviors: Moderate(pt continuously grabbing his face)  Cognitive Functioning Concentration: Decreased Memory: Remote Intact, Recent Intact Is patient IDD: No Is patient DD?: No Insight: Poor Impulse Control: Poor Appetite: Fair Have you had any weight changes? : No Change Sleep: No Change Total Hours of Sleep: 8 Vegetative Symptoms: None  ADLScreening [facility] Assessment Services) Patient's cognitive ability adequate to safely complete daily activities?: Yes Patient able to express  need for assistance with ADLs?: Yes Independently performs ADLs?: Yes (appropriate for developmental age)  Prior Inpatient Therapy Prior Inpatient Therapy: Yes Prior Therapy Dates: 2018,  2017 Prior Therapy Facilty/Provider(s): [facility], [facility], [facility], [facility] Reason for Treatment: SUBSTANCE ABUSE, SI, SCHIZOAFFECTIVE   Prior Outpatient Therapy Prior Outpatient Therapy: Yes Prior Therapy Dates: 2018 Prior Therapy Facilty/Provider(s): Dr. Tawni Pummel. Caryn Section, MD Reason for Treatment: med management  Does patient have an ACCT team?: No Does patient have Intensive In-House Services?  : No Does patient have [facility] services? : No Does patient have P4CC services?: No  ADL Screening (condition at time of admission) Patient's cognitive ability adequate to safely complete daily activities?: Yes Is the patient deaf or have difficulty hearing?: No Does the patient have difficulty seeing, even when wearing glasses/contacts?: No Does the patient have difficulty concentrating, remembering, or making decisions?: Yes Patient able to express need for assistance with ADLs?: Yes Does the patient have difficulty dressing or bathing?: No Independently performs ADLs?: Yes (appropriate for developmental age) Does the patient have difficulty walking or climbing stairs?: No Weakness of Legs: None Weakness of Arms/Hands: None  Home Assistive Devices/Equipment Home Assistive Devices/Equipment: None    Abuse/Neglect Assessment (Assessment to be complete while patient is alone) Abuse/Neglect Assessment Can Be Completed: Yes Physical Abuse: Denies Verbal Abuse: Denies Sexual Abuse: Denies Exploitation of patient/patient's resources: Denies Self-Neglect: Denies     Merchant navy officer (For Healthcare) Does Patient Have a Medical Advance Directive?: No Would patient like information on creating a medical advance directive?: No - Patient declined    Additional Information 1:1 In Past 12 Months?: No CIRT Risk: No Elopement Risk: No Does patient have medical clearance?: Yes     Disposition: TTS consulted with Nira Conn,  NP who recommends inpt treatment. [facility] currently reviewing for possible  admission. Pt's nurse Irving Burton, RN has been advised of the disposition. EDP Rancour, Jeannett Senior, MD also notified of the disposition.   Disposition Initial Assessment Completed for this Encounter: Yes Disposition of Patient: Admit Type of inpatient treatment program: Adult(per Nira Conn, NP) Patient refused recommended treatment: No  This service was provided via telemedicine using a 2-way, interactive audio and video technology.  Names of all persons participating in this telemedicine service and their role in this encounter. Name: [name] Role: Patient  Name: Princess Bruins Role: TTS          Karolee Ohs 02/13/2018 5:18 AM

## 2018-02-13 NOTE — Progress Notes (Signed)
Patient meets criteria for inpatient treatment. There are currently no appropriate beds available at [facility] per [facility]. CSW faxed referrals to the following facilities for review. [place], [facility], [place], [facility], [place], [place], [facility], [place] [facility], [facility], [place], [facility], and [facility].   TTS will continue to seek bed placement.   Moss Mc, MSW, [name]-A, LCAS-A 02/13/2018 1:53 PM

## 2018-02-13 NOTE — ED Triage Notes (Signed)
Pt C/o L lower dental pain for the past couple of days. Also reports suicidal ideations that have been ongoing with plan to slit throat or wrists. Pt is supposed to take 300mg  seroquel but states "it's too strong and makes me feel sick."

## 2018-02-13 NOTE — ED Notes (Signed)
Pt became irritable when advised he may not have another snack as requested. Advised pt he was given snack at 1500. When Sitter asking for his dinner order request from the 2 options of the day. States he wants to choose what he wants. Advised pt unable to do so. When RN attempted to discuss Medical Clearance Pt Policy form - pt stated "Just leave the door open, leave the light on, and leave me alone. Don't show me anything. I don't want anything". RN encouraged pt to read over form - pt refused and stated "I'm not going to sign anything. Just leave me alone". Pt noted to be lying on bed shaking his foot. RN left form at bedside.

## 2018-02-14 DIAGNOSIS — Z813 Family history of other psychoactive substance abuse and dependence: Secondary | ICD-10-CM

## 2018-02-14 DIAGNOSIS — Z818 Family history of other mental and behavioral disorders: Secondary | ICD-10-CM

## 2018-02-14 DIAGNOSIS — F250 Schizoaffective disorder, bipolar type: Secondary | ICD-10-CM | POA: Diagnosis not present

## 2018-02-14 DIAGNOSIS — F1010 Alcohol abuse, uncomplicated: Secondary | ICD-10-CM

## 2018-02-14 DIAGNOSIS — R45851 Suicidal ideations: Secondary | ICD-10-CM

## 2018-02-14 DIAGNOSIS — K0889 Other specified disorders of teeth and supporting structures: Secondary | ICD-10-CM

## 2018-02-14 DIAGNOSIS — Z590 Homelessness: Secondary | ICD-10-CM

## 2018-02-14 DIAGNOSIS — Z736 Limitation of activities due to disability: Secondary | ICD-10-CM

## 2018-02-14 DIAGNOSIS — F1410 Cocaine abuse, uncomplicated: Secondary | ICD-10-CM | POA: Diagnosis not present

## 2018-02-14 DIAGNOSIS — Z915 Personal history of self-harm: Secondary | ICD-10-CM

## 2018-02-14 DIAGNOSIS — Z560 Unemployment, unspecified: Secondary | ICD-10-CM

## 2018-02-14 DIAGNOSIS — F17210 Nicotine dependence, cigarettes, uncomplicated: Secondary | ICD-10-CM

## 2018-02-14 NOTE — Consult Note (Addendum)
Telepsych Consultation   Reason for Consult: SI Referring Physician: Ezequiel Essex, MD Location of Patient: [facility] Location of Provider: [facility]  Patient Identification: [name] MRN:  [id number] Principal Diagnosis: <principal problem not specified> Diagnosis:   Patient Active Problem List   Diagnosis Date Noted  . Asthma [J45.909] 12/26/2016  . Tobacco use disorder [F17.200] 12/26/2016  . Schizophrenia ([place]) [F20.9] 12/26/2016  . Cocaine abuse with cocaine-induced mood disorder ([place]) [F14.14] 09/06/2016  . Alcohol use disorder, mild, abuse [F10.10] 07/31/2016  . Substance induced mood disorder ([place]) [F19.94] 07/30/2016  . Schizoaffective disorder, bipolar type ([place]) [F25.0] 05/31/2016  . Cocaine use disorder, severe, dependence ([place]) [F14.20] 05/31/2016  . Essential hypertension [I10] 05/31/2016    Total Time spent with patient: 30 minutes  Subjective:   [name] is a 52 y.o. male patient admitted with Schizoaffective disorder; Cocaine use disorder, severe; Alcohol use disorder, severe.  HPI: Per the TTS assessment completed on 02/13/18 by Lind Covert: [name] is an 51 y.o. male who presents to the ED voluntarily due to suicidal thoughts with a plan to cut his wrists. Pt states he has attempted suicide in the past and recently, his AH have been telling him to kill himself. Pt states "I came this close to acting on it." Pt has a hx of inpt admissions c/o similar concerns.   Pt states he is homeless, unemployed, receiving disability, and abusing substances. Pt states he abuses cocaine and alcohol frequently. Pt states he is connected to [employer] and he is currently attempting to establish an OPT provider. Pt states he is prescribed Seroquel (336m AM and PM) but states he does not take it because he does not like the way it makes him feel.  Pt irritable throughout the assessment and when asked about family hx pt said "I don't know  anything about my family." Pt was asked if he has experienced any hx of abuse or neglect and pt "no, none of that ma'am." Pt continued holding his face throughout the assessment. Per chart, pt has been dealing with severe dental pain.   On Exam: Patient was seen via tele-psych, chart reviewed with treatment team. Patient in bed, awake, alert and oriented x4. Patient reiterated the reason for this hospital admission as documented above. Patient stated, I came to the hospital because the voices in my head are becoming too loud". Patient stated that he has been off of his home medication for a while because they were too strong for him. He stated that he currently does not have any outpatient provider, that he dismissed the last psychiatrist he was seeing (Dr. WFrance Ravens because they're not doing anything for him. Patient reports that he will not take his medications anymore here in the hospital because the dose is too high for him. He reports that [facility] with [facility] but don't have an [facility] yet. Patient became very agitated when attempting to discuss long-term solutions for his chronic condition. He got angry about being informed of his drug test that is positive for cocaine. Patient walked away from the camera and stated that he will go and find someone else to refer him to the psychiatrist. Patient was very argumentative and appears to be malingering for secondary gains. Per earlier tele-psych with [facility] [place] patient denies HI and reports passive SI without any plan. Patient does not appear to be responding to internal stimuli during this encounter. He can be safely discharged with OP resources.  Past Psychiatric History: As in H&P  Risk to Self: Suicidal Ideation: Yes-Currently Present Suicidal Intent: No-Not Currently/Within Last 6 Months Is patient at risk for suicide?: Yes Suicidal Plan?: Yes-Currently Present Specify Current Suicidal Plan: pt states he wants to  cut his wrists  Access to Means: Yes Specify Access to Suicidal Means: pt has access to sharps that could be used to cut himself  What has been your use of drugs/alcohol within the last 12 months?: reports to recent cocaine and alcohol use  How many times?: (multiple) Triggers for Past Attempts: Unpredictable, Hallucinations Intentional Self Injurious Behavior: Cutting Comment - Self Injurious Behavior: pt reports a hx of self-harm 1 year ago  Risk to Others: Homicidal Ideation: No Thoughts of Harm to Others: No Current Homicidal Intent: No Current Homicidal Plan: No Access to Homicidal Means: No History of harm to others?: No Assessment of Violence: None Noted Does patient have access to weapons?: No Criminal Charges Pending?: No Does patient have a court date: No Prior Inpatient Therapy: Prior Inpatient Therapy: Yes Prior Therapy Dates: 2018, 2017 Prior Therapy Facilty/Provider(s): [facility], [facility] [place] Reason for Treatment: SUBSTANCE ABUSE, SI, SCHIZOAFFECTIVE  Prior Outpatient Therapy: Prior Outpatient Therapy: Yes Prior Therapy Dates: 2018 Prior Therapy Facilty/Provider(s): Dr. Talmage Coin. Hassell Done, MD Reason for Treatment: med management  Does patient have an ACCT team?: No Does patient have Intensive In-House Services?  : No Does patient have [facility] services? : No Does patient have [facility] services?: No  Past Medical History:  Past Medical History:  Diagnosis Date  . Asthma   . Bipolar 1 disorder ([employer])   . Depression   . Homelessness   . Hypertension   . Schizoaffective disorder ([place])    History reviewed. No pertinent surgical history. Family History:  Family History  Problem Relation Age of Onset  . Drug abuse Mother   . Suicidality Mother    Family Psychiatric  History: Unknown Social History:  Social History   Substance and Sexual Activity  Alcohol Use Yes   Comment: infrequently     Social History   Substance and Sexual Activity  Drug Use Yes  . Types:  Cocaine    Social History   Socioeconomic History  . Marital status: Single    Spouse name: None  . Number of children: None  . Years of education: None  . Highest education level: None  Social Needs  . Financial resource strain: None  . Food insecurity - worry: None  . Food insecurity - inability: None  . Transportation needs - medical: None  . Transportation needs - non-medical: None  Occupational History  . None  Tobacco Use  . Smoking status: Current Every Day Smoker    Types: Cigarettes  . Smokeless tobacco: Never Used  Substance and Sexual Activity  . Alcohol use: Yes    Comment: infrequently  . Drug use: Yes    Types: Cocaine  . Sexual activity: Yes    Birth control/protection: None  Other Topics Concern  . None  Social History Narrative  . None   Additional Social History:    Allergies:   Allergies  Allergen Reactions  . Tylenol [Acetaminophen] Hives and Nausea And Vomiting    Labs:  Results for orders placed or performed during the hospital encounter of 02/13/18 (from the past 48 hour(s))  Rapid urine drug screen (hospital performed)     Status: Abnormal   Collection Time: 02/13/18  1:39 AM  Result Value Ref Range   Opiates NONE DETECTED  NONE DETECTED   Cocaine POSITIVE (A) NONE DETECTED   Benzodiazepines NONE DETECTED NONE DETECTED   Amphetamines NONE DETECTED NONE DETECTED   Tetrahydrocannabinol NONE DETECTED NONE DETECTED   Barbiturates NONE DETECTED NONE DETECTED    Comment: (NOTE) DRUG SCREEN FOR MEDICAL PURPOSES ONLY.  IF CONFIRMATION IS NEEDED FOR ANY PURPOSE, NOTIFY LAB WITHIN 5 DAYS. LOWEST DETECTABLE LIMITS FOR URINE DRUG SCREEN Drug Class                     Cutoff (ng/mL) Amphetamine and metabolites    1000 Barbiturate and metabolites    200 Benzodiazepine                 417 Tricyclics and metabolites     300 Opiates and metabolites        300 Cocaine and metabolites        300 THC                            50 Performed at  [facility], [place] [address] [place], [place] [address]   Comprehensive metabolic panel     Status: Abnormal   Collection Time: 02/13/18  1:41 AM  Result Value Ref Range   Sodium 134 (L) 135 - 145 mmol/L   Potassium 4.2 3.5 - 5.1 mmol/L   Chloride 100 (L) 101 - 111 mmol/L   CO2 20 (L) 22 - 32 mmol/L   Glucose, Bld 139 (H) 65 - 99 mg/dL   BUN 16 6 - 20 mg/dL   Creatinine, Ser 1.09 0.61 - 1.24 mg/dL   Calcium 9.1 8.9 - 10.3 mg/dL   Total Protein 7.4 6.5 - 8.1 g/dL   Albumin 4.4 3.5 - 5.0 g/dL   AST 73 (H) 15 - 41 U/L   ALT 51 17 - 63 U/L   Alkaline Phosphatase 60 38 - 126 U/L   Total Bilirubin 1.1 0.3 - 1.2 mg/dL   GFR calc non Af Amer >60 >60 mL/min   GFR calc Af Amer >60 >60 mL/min    Comment: (NOTE) The eGFR has been calculated using the CKD EPI equation. This calculation has not been validated in all clinical situations. eGFR's persistently <60 mL/min signify possible Chronic Kidney Disease.    Anion gap 14 5 - 15    Comment: Performed at [facility] [address] [place], [place] [address]  cbc     Status: None   Collection Time: 02/13/18  1:41 AM  Result Value Ref Range   WBC 7.6 4.0 - 10.5 K/uL   RBC 4.25 4.22 - 5.81 MIL/uL   Hemoglobin 14.2 13.0 - 17.0 g/dL   HCT 40.3 39.0 - 52.0 %   MCV 94.8 78.0 - 100.0 fL   MCH 33.4 26.0 - 34.0 pg   MCHC 35.2 30.0 - 36.0 g/dL   RDW 13.6 11.5 - 15.5 %   Platelets 191 150 - 400 K/uL    Comment: Performed at [facility], [place] [address] [place], [place] [address]  Ethanol     Status: Abnormal   Collection Time: 02/13/18  1:42 AM  Result Value Ref Range   Alcohol, Ethyl (B) 43 (H) <10 mg/dL    Comment:        LOWEST DETECTABLE LIMIT FOR SERUM ALCOHOL IS 10 mg/dL FOR MEDICAL PURPOSES ONLY Performed at [facility], [place] [address] [place], [place] [address]  Salicylate level     Status: None   Collection Time: 02/13/18  1:42 AM  Result Value Ref Range   Salicylate Lvl <8.0 2.8 - 30.0 mg/dL     Comment: Performed at [facility] [address] [place], [place] [address]  Acetaminophen level     Status: Abnormal   Collection Time: 02/13/18  1:42 AM  Result Value Ref Range   Acetaminophen (Tylenol), Serum <10 (L) 10 - 30 ug/mL    Comment:        THERAPEUTIC CONCENTRATIONS VARY SIGNIFICANTLY. A RANGE OF 10-30 ug/mL MAY BE AN EFFECTIVE CONCENTRATION FOR MANY PATIENTS. HOWEVER, SOME ARE BEST TREATED AT CONCENTRATIONS OUTSIDE THIS RANGE. ACETAMINOPHEN CONCENTRATIONS >150 ug/mL AT 4 HOURS AFTER INGESTION AND >50 ug/mL AT 12 HOURS AFTER INGESTION ARE OFTEN ASSOCIATED WITH TOXIC REACTIONS. Performed at [facility], [place] [address] [place], [place] [address]     Medications:  Current Facility-Administered Medications  Medication Dose Route Frequency Provider Last Rate Last Dose  . albuterol (PROVENTIL HFA;VENTOLIN HFA) 108 (90 Base) MCG/ACT inhaler 1-2 puff  1-2 puff Inhalation Q4H PRN Rancour, Stephen, MD      . amLODipine (NORVASC) tablet 10 mg  10 mg Oral Daily Rancour, Stephen, MD   10 mg at 02/13/18 1016  . lisinopril (PRINIVIL,ZESTRIL) tablet 40 mg  40 mg Oral Daily Rancour, Stephen, MD   40 mg at 02/13/18 1016  . LORazepam (ATIVAN) injection 0-4 mg  0-4 mg Intravenous Q6H Rancour, Annie Main, MD       Or  . LORazepam (ATIVAN) tablet 0-4 mg  0-4 mg Oral Q6H Rancour, Stephen, MD      . Derrill Memo ON 02/15/2018] LORazepam (ATIVAN) injection 0-4 mg  0-4 mg Intravenous Q12H Rancour, Stephen, MD       Or  . Derrill Memo ON 02/15/2018] LORazepam (ATIVAN) tablet 0-4 mg  0-4 mg Oral Q12H Rancour, Stephen, MD      . naproxen (NAPROSYN) tablet 500 mg  500 mg Oral BID WC Virgel Manifold, MD   500 mg at 02/13/18 2112  . QUEtiapine (SEROQUEL) tablet 200 mg  200 mg Oral QHS Rancour, Annie Main, MD   200 mg at 02/13/18 2112  . thiamine (VITAMIN B-1) tablet 100 mg  100 mg Oral Daily Rancour, Stephen, MD   100 mg at 02/13/18 1016   Or  . thiamine (B-1) injection 100 mg  100 mg Intravenous  Daily Rancour, Stephen, MD      . traZODone (DESYREL) tablet 150 mg  150 mg Oral QHS Rancour, Annie Main, MD       Current Outpatient Medications  Medication Sig Dispense Refill  . albuterol (PROVENTIL HFA;VENTOLIN HFA) 108 (90 Base) MCG/ACT inhaler Inhale 1-2 puffs into the lungs every 4 (four) hours as needed for shortness of breath. 1 Inhaler 1  . amLODipine (NORVASC) 10 MG tablet Take 1 tablet (10 mg total) by mouth daily. 30 tablet 0  . lisinopril (PRINIVIL,ZESTRIL) 40 MG tablet Take 1 tablet (40 mg total) by mouth daily. 30 tablet 0  . predniSONE (DELTASONE) 20 MG tablet Take 3 tablets (60 mg total) by mouth daily. 12 tablet 0  . QUEtiapine (SEROQUEL) 200 MG tablet Take 1 tablet (200 mg total) by mouth at bedtime. 30 tablet 1  . traZODone (DESYREL) 150 MG tablet Take 1 tablet (150 mg total) by mouth at bedtime. 30 tablet 1    Musculoskeletal: UTA via camera  Psychiatric Specialty Exam: Physical Exam  Nursing note and vitals reviewed.  Review of Systems  Psychiatric/Behavioral: Positive for hallucinations and substance abuse (+) Cocaine.  All other systems reviewed and are negative.   Blood pressure (!) 184/101, pulse 68, temperature 98.2 F (36.8 C), temperature source Oral, resp. rate 20, height _0  (1.753 m), weight 86.2 kg (190 lb), SpO2 100 %.Body mass index is 28.06 kg/m.  General Appearance: on hospital scrub  Eye Contact:  Good  Speech:  Clear and Coherent and Normal Rate  Volume:  Increased  Mood:  Angry and Irritable  Affect:  Congruent  Thought Process:  Coherent and Goal Directed  Orientation:  Full (Time, Place, and Person)  Thought Content:  WDL and Logical  Suicidal Thoughts:  chronic and passive  Homicidal Thoughts:  No  Memory:  Immediate;   Good Recent;   Good Remote;   Fair  Judgement:  Intact  Insight:  Present  Psychomotor Activity:  Normal  Concentration:  Concentration: Good and Attention Span: Good  Recall:  Good  Fund of Knowledge:  Good   Language:  Good  Akathisia:  Negative  Handed:  Right  AIMS (if indicated):     Assets:  Communication Skills Desire for Improvement Financial Resources/Insurance Housing Social Support  ADL's:  Intact  Cognition:  WNL  Sleep:      Treatment Plan recommendations as discussed and agreed with Dr. Dwyane Dee:   Treatment Plan Summary: Plan to discharge with OP resources   Follow up with [facility] for therapy and medication management Follow up with Social Work consult for Care coordination Take all medications as prescribed Avoid the use of alcohol and/or drugs Stay well hydrated Activity as tolerated Follow up with PCP for any new or existing medical concerns   Disposition: No evidence of imminent risk to self or others at present.   Patient does not meet criteria for psychiatric inpatient admission. Supportive therapy provided about ongoing stressors. Refer to IOP. Discussed crisis plan, support from social network, calling 911, coming to the Emergency Department, and calling Suicide Hotline.  This service was provided via telemedicine using a 2-way, interactive audio and video technology.  Names of all persons participating in this telemedicine service and their role in this encounter. Name: [name] Role: Patient  Name: Timoteo Carreiro A. Mea [place]  Role: NP           Vicenta Aly, NP 02/14/2018 9:52 AM

## 2018-02-14 NOTE — ED Notes (Signed)
Pt noted to be upset d/t did not like the breakfast he received while pt is eating the breakfast. Advised pt no second tray will be ordered. States "Just get out of my room. You're already starting w/me this morning" - as pt is attempting to close door to room. RN advised pt door must remain open - may be cracked open. Pt stated "I know that!".  Pt continued to eat his breakfast. Attempted to administer 0800 med - Naprosyn - pt refused. States "I want to speak to the [employer] please - not the Press photographer".

## 2018-02-14 NOTE — ED Notes (Signed)
Pt refusing d/c paperwork w/outpt resources, meds, and VS's to be taken. Offered for Sitter/NT to take VS's rather than RN - pt refused. Stated "I just want my stuff so I can get out of here!" Belongings given.

## 2018-02-14 NOTE — ED Notes (Addendum)
[name], aware pt asking to speak w/AC. Re-TTS being performed.

## 2018-02-14 NOTE — [facility] Counselor (Signed)
Reassessment- Pt reports SI with no plan. Pt denies HI. Pt reports voices telling him to harm himself.  Pt was upset about having to keep his door open and his breakfast. Pt was agitated and angry.  Pt states "I'm not kissing nobody's butt and I'm tired of biting my tongue."  Inetta Fermo, NP recommends tele-psych today.   Wolfgang [place], [facility] Triage Specialist

## 2018-02-14 NOTE — ED Notes (Signed)
After pt dressed, he asked for d/c papers. D/C papers given w/Outpt Resources. Pt refused to sign.

## 2018-02-14 NOTE — ED Notes (Signed)
[name], spoke w/pt.

## 2018-03-02 DIAGNOSIS — F19959 Other psychoactive substance use, unspecified with psychoactive substance-induced psychotic disorder, unspecified: Secondary | ICD-10-CM | POA: Insufficient documentation

## 2018-05-03 DIAGNOSIS — F10931 Alcohol use, unspecified with withdrawal delirium: Secondary | ICD-10-CM | POA: Insufficient documentation

## 2018-08-22 ENCOUNTER — Emergency Department (HOSPITAL_COMMUNITY)
Admission: EM | Admit: 2018-08-22 | Discharge: 2018-08-22 | Disposition: A | Discharge status: Home / Self Care | Payer: Medicaid Other | Attending: Emergency Medicine | Admitting: Emergency Medicine

## 2018-08-22 ENCOUNTER — Encounter (HOSPITAL_COMMUNITY): Payer: Self-pay

## 2018-08-22 ENCOUNTER — Other Ambulatory Visit: Payer: Self-pay

## 2018-08-22 ENCOUNTER — Emergency Department (HOSPITAL_COMMUNITY): Payer: Medicaid Other

## 2018-08-22 DIAGNOSIS — J45909 Unspecified asthma, uncomplicated: Secondary | ICD-10-CM | POA: Diagnosis not present

## 2018-08-22 DIAGNOSIS — J209 Acute bronchitis, unspecified: Secondary | ICD-10-CM

## 2018-08-22 DIAGNOSIS — L259 Unspecified contact dermatitis, unspecified cause: Secondary | ICD-10-CM | POA: Diagnosis not present

## 2018-08-22 DIAGNOSIS — F17210 Nicotine dependence, cigarettes, uncomplicated: Secondary | ICD-10-CM | POA: Diagnosis not present

## 2018-08-22 DIAGNOSIS — R0602 Shortness of breath: Secondary | ICD-10-CM | POA: Diagnosis present

## 2018-08-22 DIAGNOSIS — M436 Torticollis: Secondary | ICD-10-CM | POA: Insufficient documentation

## 2018-08-22 DIAGNOSIS — Z79899 Other long term (current) drug therapy: Secondary | ICD-10-CM | POA: Insufficient documentation

## 2018-08-22 DIAGNOSIS — L309 Dermatitis, unspecified: Secondary | ICD-10-CM

## 2018-08-22 IMAGING — DX DG CHEST 2V
2 series · 2 of 2 positions shown · non-contrast
Comparison: [DATE] chest radiograph

CLINICAL DATA: 52 y/o  M; shortness of breath.

EXAM:
CHEST - 2 VIEW

[chest pa]
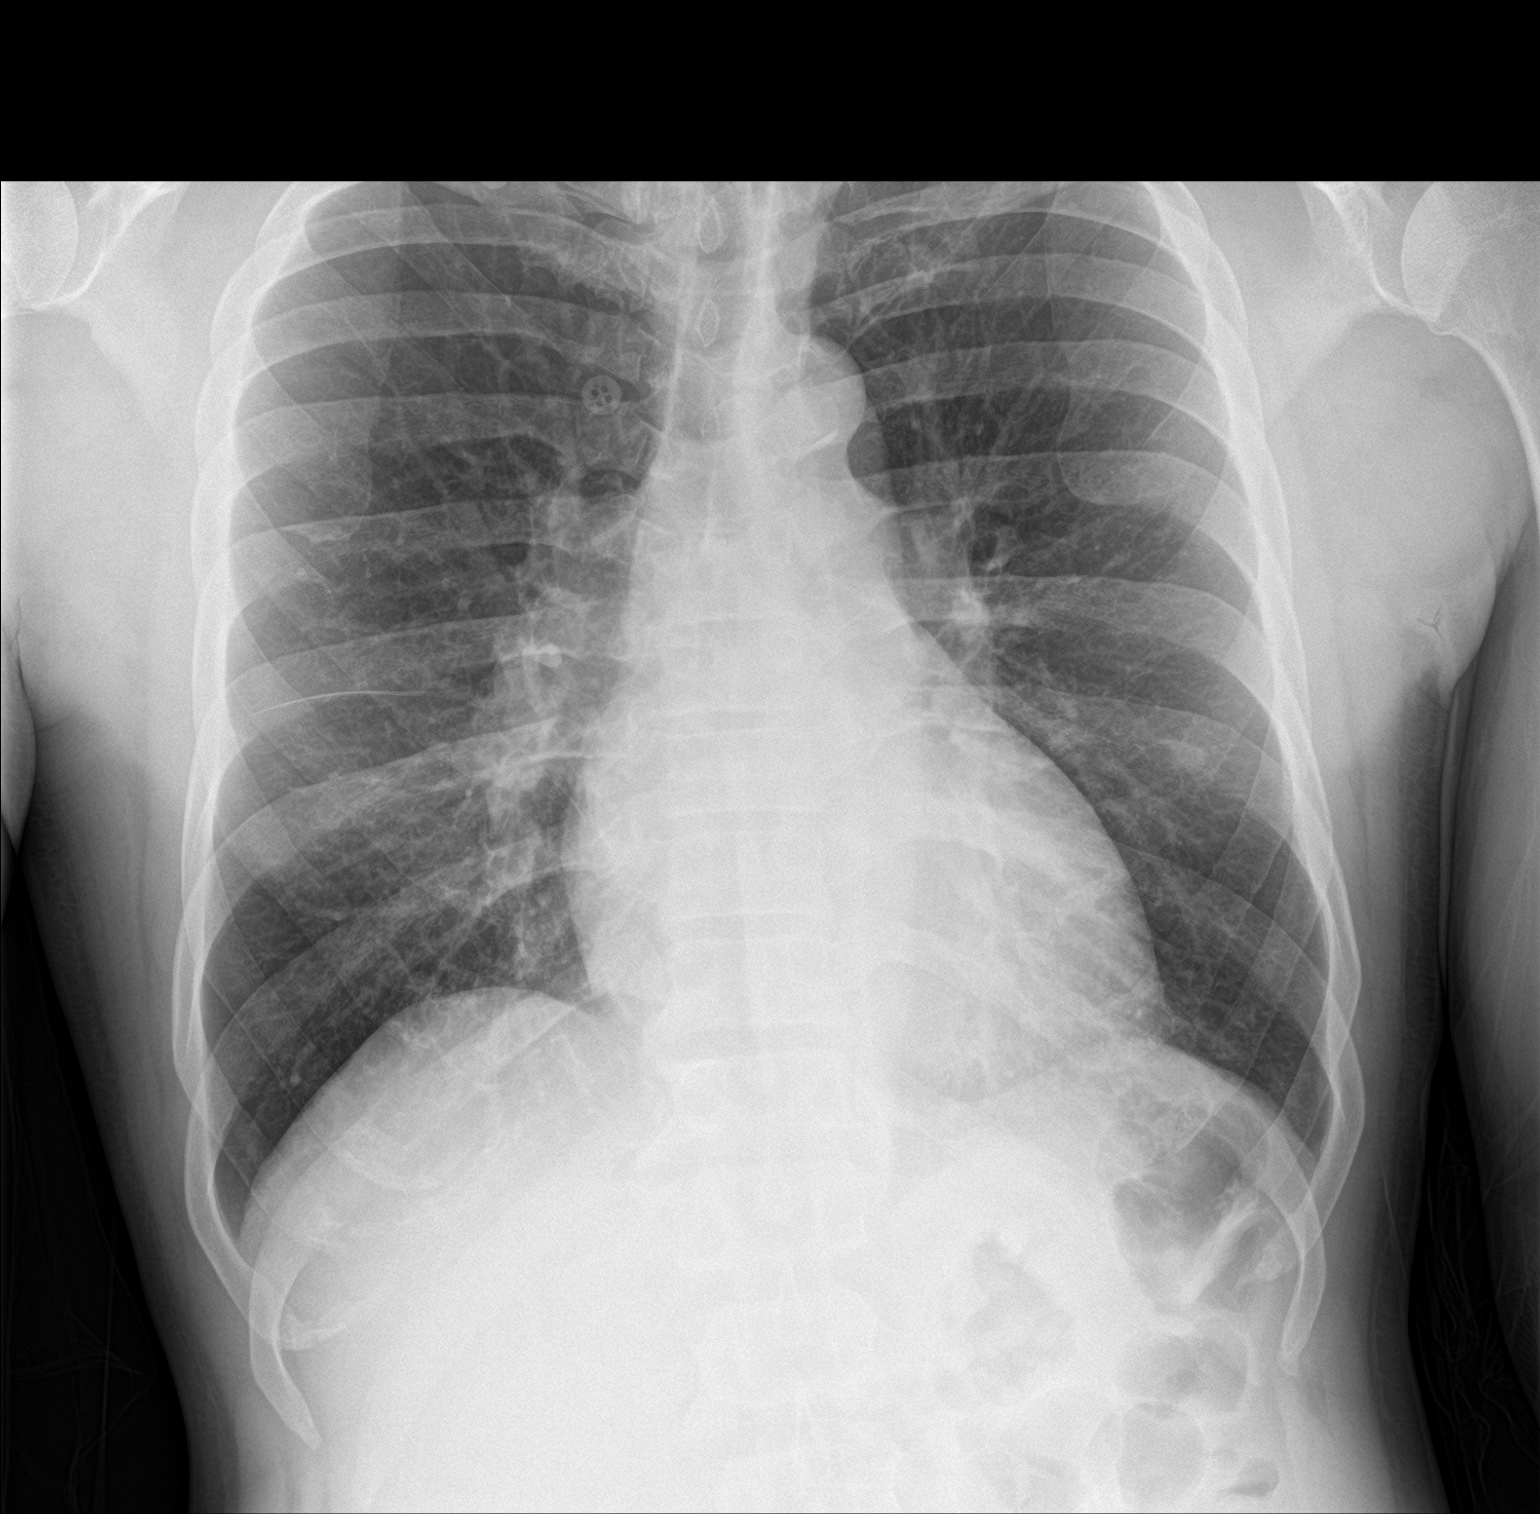

[chest lat]
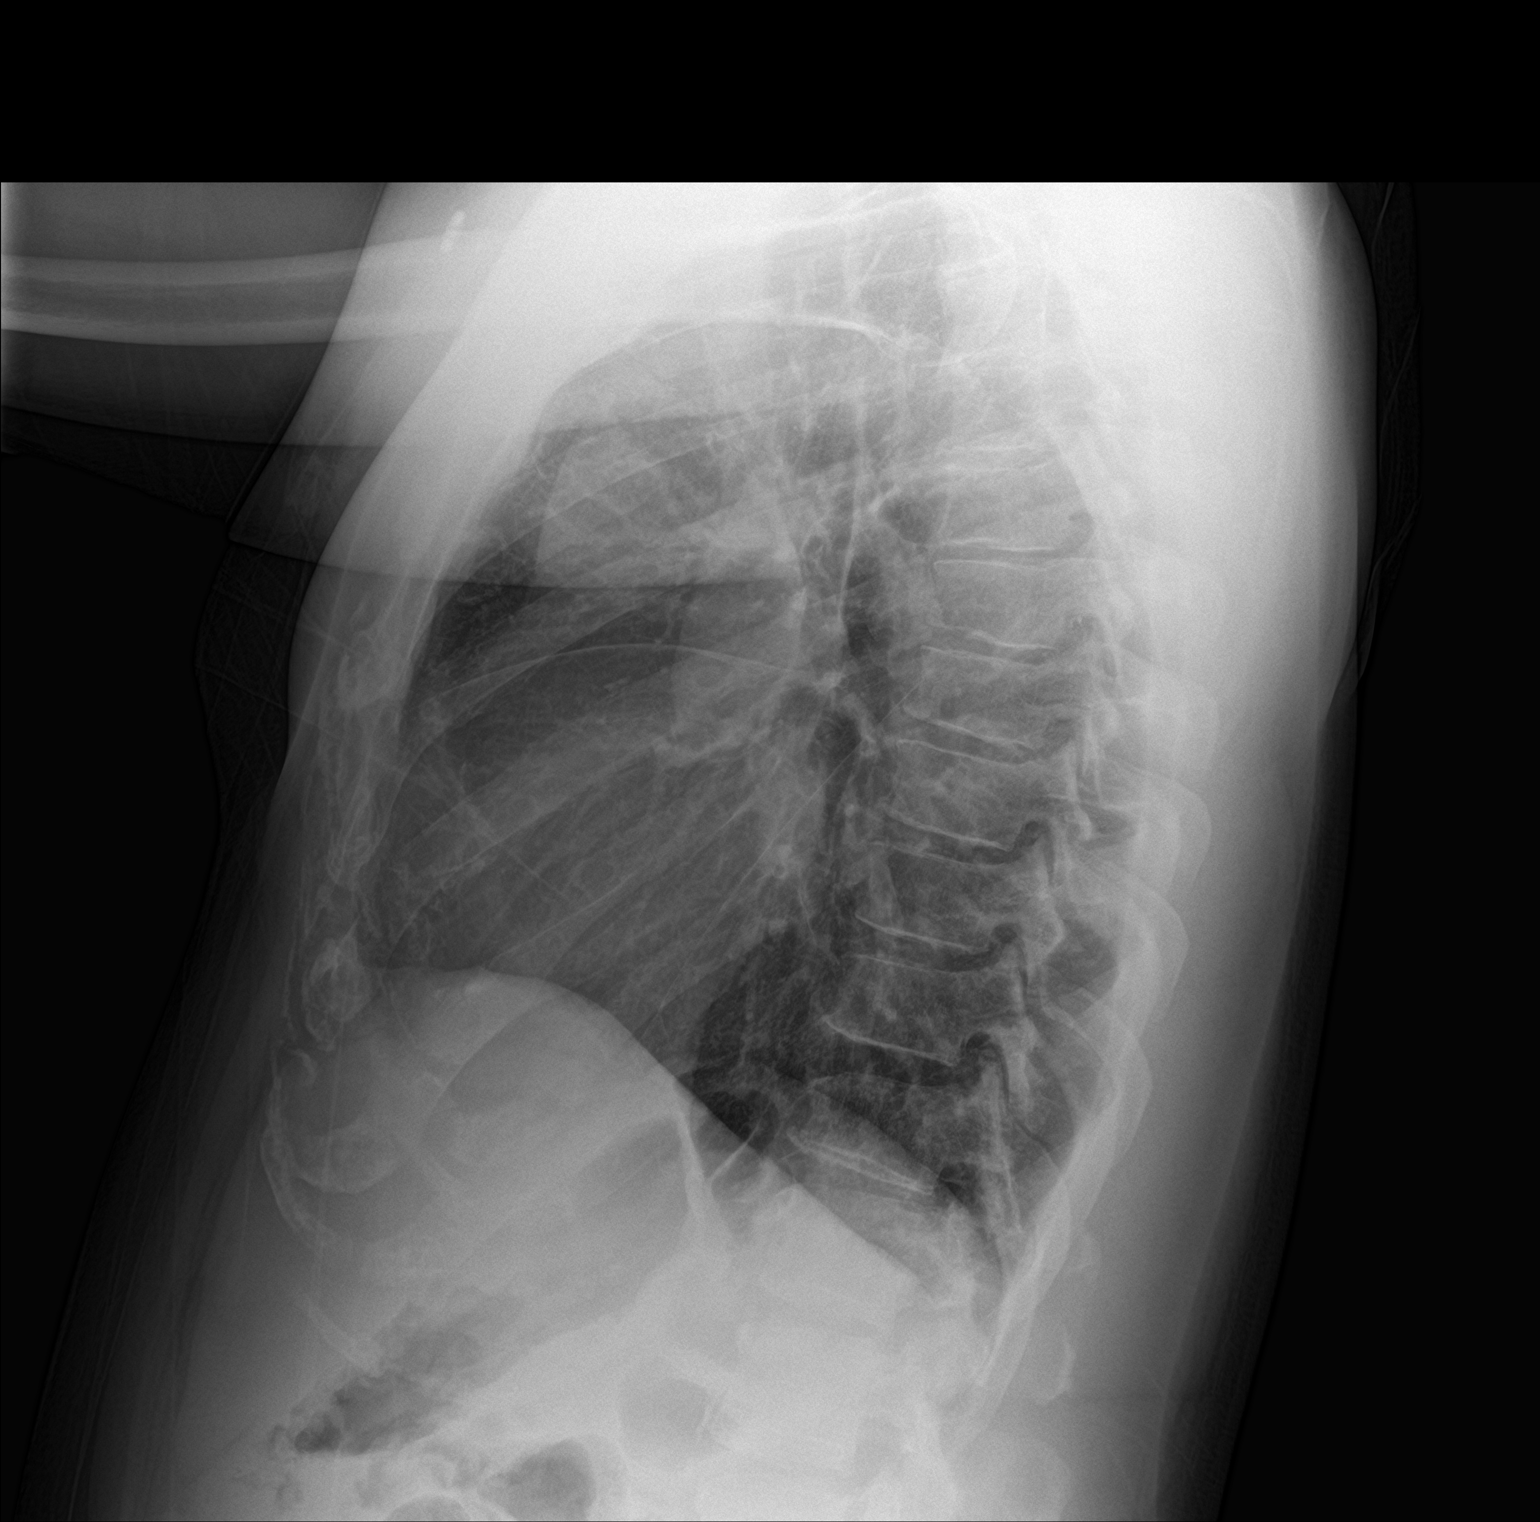

[2 of 2 positions shown; findings below may reference images not displayed]

FINDINGS: Stable heart size and mediastinal contours are within normal limits.
Calcific atherosclerosis of the aorta. Small nodular opacities
project over the ninth posterior ribs bilaterally, likely nipple
shadows. No consolidation, effusion, or pneumothorax.
IMPRESSION: 1. No acute pulmonary process identified.
2.  Aortic Atherosclerosis ([CT]-[CT]).

By: BABNIK M.D.

## 2018-08-22 MED ORDER — PREDNISONE 20 MG PO TABS
60.0000 mg | ORAL_TABLET | Freq: Once | ORAL | Status: AC
  Administered 2018-08-22: 60 mg via ORAL
  Filled 2018-08-22: qty 3

## 2018-08-22 MED ORDER — CYCLOBENZAPRINE HCL 10 MG PO TABS
10.0000 mg | ORAL_TABLET | Freq: Once | ORAL | Status: AC
  Administered 2018-08-22: 10 mg via ORAL
  Filled 2018-08-22: qty 1

## 2018-08-22 MED ORDER — ALBUTEROL SULFATE HFA 108 (90 BASE) MCG/ACT IN AERS: 1.0000 | INHALATION_SPRAY | RESPIRATORY_TRACT | 1 refills | Status: DC | PRN

## 2018-08-22 MED ORDER — CYCLOBENZAPRINE HCL 10 MG PO TABS: 10.0000 mg | ORAL_TABLET | Freq: Three times a day (TID) | ORAL | 0 refills | Status: DC | PRN

## 2018-08-22 MED ORDER — DIPHENHYDRAMINE HCL 25 MG PO CAPS
25.0000 mg | ORAL_CAPSULE | Freq: Once | ORAL | Status: AC
  Administered 2018-08-22: 25 mg via ORAL
  Filled 2018-08-22: qty 1

## 2018-08-22 MED ORDER — PREDNISONE 20 MG PO TABS: 40.0000 mg | ORAL_TABLET | Freq: Every day | ORAL | 0 refills | Status: DC

## 2018-08-22 MED ORDER — TRAMADOL HCL 50 MG PO TABS
50.0000 mg | ORAL_TABLET | Freq: Once | ORAL | Status: AC
  Administered 2018-08-22: 50 mg via ORAL
  Filled 2018-08-22: qty 1

## 2018-08-22 MED ORDER — TRAMADOL HCL 50 MG PO TABS: 50.0000 mg | ORAL_TABLET | Freq: Four times a day (QID) | ORAL | 0 refills | Status: DC | PRN

## 2018-08-22 MED ORDER — DIPHENHYDRAMINE HCL 50 MG/ML IJ SOLN: 25.0000 mg | Freq: Once | INTRAMUSCULAR | Status: DC

## 2018-08-22 NOTE — ED Triage Notes (Signed)
Pt BIB [facility] for eval of shortness of breath x6 hours. Pt refusing to provide hx to this RN stating "that man just told you, why are you asking me something you know? Ask me something you dont know". LS CTAB, 100% on RA. Takes lisinopril BID, EMS reports compliance w/ home medications, again, pt would not elaborate further.

## 2018-08-22 NOTE — ED Notes (Signed)
Refusing EKG

## 2018-08-22 NOTE — ED Provider Notes (Signed)
[facility] EMERGENCY DEPARTMENT Provider Note   CSN: [id number] Arrival date & time: 08/22/18  0042     History   Chief Complaint Chief Complaint  Patient presents with  . Shortness of Breath    [name] is a 52 y.o. male.  The history is provided by the patient.  He has history of asthma, bipolar disorder, substance abuse, hypertension and comes in with multiple complaints.  He has had some shortness of breath for the last 2 days which got worse today.  There is a cough productive of small amount of clear sputum.  He denies chest pain, heaviness, tightness, pressure.  He is also noted some soreness in the left side of his neck which got significantly worse today.  It is significantly worse if he tries to turn his head or if he lays down.  He denies weakness, numbness, tingling.  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  He is also complaining of a pruritic rash on both arms.  He denies fever, chills, sweats.  He has been taking ibuprofen for his neck pain without relief.  He was brought in by ambulance and was given a nebulizer treatment which should help his breathing somewhat.  He continues to smoke 1 pack of cigarettes a day.  Past Medical History:  Diagnosis Date  . Asthma   . Bipolar 1 disorder (HCC)   . Depression   . Homelessness   . Hypertension   . Schizoaffective disorder [place])     Patient Active Problem List   Diagnosis Date Noted  . Asthma 12/26/2016  . Tobacco use disorder 12/26/2016  . Schizophrenia (HCC) 12/26/2016  . Cocaine abuse with cocaine-induced mood disorder (HCC) 09/06/2016  . Alcohol use disorder, mild, abuse 07/31/2016  . Substance induced mood disorder (HCC) 07/30/2016  . Schizoaffective disorder, bipolar type (HCC) 05/31/2016  . Cocaine use disorder, severe, dependence (HCC) 05/31/2016  . Essential hypertension 05/31/2016    History reviewed. No pertinent surgical  history.      Home Medications    Prior to Admission medications   Medication Sig Start Date End Date Taking? Authorizing Provider  albuterol (PROVENTIL HFA;VENTOLIN HFA) 108 (90 Base) MCG/ACT inhaler Inhale 1-2 puffs into the lungs every 4 (four) hours as needed for shortness of breath. 12/29/16   Pucilowska, Jolanta B, MD  amLODipine (NORVASC) 10 MG tablet Take 1 tablet (10 mg total) by mouth daily. 12/30/16   Tomasita Crumble, MD  lisinopril (PRINIVIL,ZESTRIL) 40 MG tablet Take 1 tablet (40 mg total) by mouth daily. 12/30/16   Tomasita Crumble, MD  predniSONE (DELTASONE) 20 MG tablet Take 3 tablets (60 mg total) by mouth daily. 12/30/16   Tomasita Crumble, MD  QUEtiapine (SEROQUEL) 200 MG tablet Take 1 tablet (200 mg total) by mouth at bedtime. 12/29/16   Pucilowska, Braulio Conte B, MD  traZODone (DESYREL) 150 MG tablet Take 1 tablet (150 mg total) by mouth at bedtime. 12/29/16   Shari Prows, MD    Family History Family History  Problem Relation Age of Onset  . Drug abuse Mother   . Suicidality Mother     Social History Social History   Tobacco Use  . Smoking status: Current Every Day Smoker    Types: Cigarettes  . Smokeless tobacco: Never Used  Substance Use Topics  . Alcohol use: Yes    Comment: infrequently  . Drug use: Yes    Types: Cocaine     Allergies  Tylenol [acetaminophen]   Review of Systems Review of Systems  All other systems reviewed and are negative.    Physical Exam Updated Vital Signs BP (!) 177/120 (BP Location: Right Arm)   Pulse 72   Temp 99.1 F (37.3 C) (Oral)   Resp 18   Ht 6' (1.829 m)   Wt 86 kg   SpO2 100%   BMI 25.71 kg/m   Physical Exam  Nursing note and vitals reviewed.  52 year old male, resting comfortably and in no acute distress. Vital signs are significant for elevated blood pressure. Oxygen saturation is 100%, which is normal. Head is normocephalic and atraumatic. PERRLA, EOMI. Oropharynx is clear. Neck has significant  tenderness of the left sternocleidomastoid muscle and there is pain with active and passive range of motion.  There is no midline tenderness.  There is no adenopathy or JVD. Back is nontender and there is no CVA tenderness. Lungs are clear without rales, wheezes, or rhonchi. Chest is nontender. Heart has regular rate and rhythm without murmur. Abdomen is soft, flat, nontender without masses or hepatosplenomegaly and peristalsis is normoactive. Extremities have no cyanosis or edema, full range of motion is present. Skin is warm and dry.  Rash present on both forearms consisting of vesicles on erythematous base suspicious for poison ivy dermatitis. Neurologic: Mental status is normal, cranial nerves are intact, there are no motor or sensory deficits.  ED Treatments / Results   Radiology Dg Chest 2 View  Result Date: 08/22/2018 CLINICAL DATA:  52 y/o  M; shortness of breath. EXAM: CHEST - 2 VIEW COMPARISON:  05/29/2017 chest radiograph FINDINGS: Stable heart size and mediastinal contours are within normal limits. Calcific atherosclerosis of the aorta. Small nodular opacities project over the ninth posterior ribs bilaterally, likely nipple shadows. No consolidation, effusion, or pneumothorax. IMPRESSION: 1. No acute pulmonary process identified. 2.  Aortic Atherosclerosis (ICD10-I70.0). Electronically Signed   By: Mitzi Hansen M.D.   On: 08/22/2018 01:14    Procedures Procedures   Medications Ordered in ED Medications  cyclobenzaprine (FLEXERIL) tablet 10 mg (has no administration in time range)  traMADol (ULTRAM) tablet 50 mg (has no administration in time range)  predniSONE (DELTASONE) tablet 60 mg (has no administration in time range)  diphenhydrAMINE (BENADRYL) injection 25 mg (has no administration in time range)     Initial Impression / Assessment and Plan / ED Course  I have reviewed the triage vital signs and the nursing notes.  Pertinent imaging results that were  available during my care of the patient were reviewed by me and considered in my medical decision making (see chart for details).  Cough and dyspnea likely viral bronchitis.  No wheezing noted on my exam, but he did have improvement with albuterol nebulizer treatment prior to my seeing him.  Chest x-ray shows no evidence of pneumonia, no indication for antibiotics.  Neck pain appears to be torticollis.  No indication for imaging.  He is advised to continue taking ibuprofen and is given a dose of cyclobenzaprine and tramadol in the ED.  Rash appears to be poison ivy dermatitis.  He is started on prednisone to treat both his bronchitis and his dermatitis.  Old records are reviewed, and he does have a prior ED visit for viral URI and another ED visit for cough.  He had moderate relief of pain with above-noted treatment.  He is discharged with prescriptions for cyclobenzaprine, tramadol, prednisone and given a refill on his prescription for albuterol inhaler.  Return precautions  discussed.  Final Clinical Impressions(s) / ED Diagnoses   Final diagnoses:  Acute bronchitis, unspecified organism  Torticollis, acquired  Dermatitis    ED Discharge Orders         Ordered    albuterol (PROVENTIL HFA;VENTOLIN HFA) 108 (90 Base) MCG/ACT inhaler  Every 4 hours PRN     08/22/18 0349    predniSONE (DELTASONE) 20 MG tablet  Daily     08/22/18 0349    cyclobenzaprine (FLEXERIL) 10 MG tablet  3 times daily PRN     08/22/18 0349    traMADol (ULTRAM) 50 MG tablet  Every 6 hours PRN     08/22/18 0349           Dione Booze, MD 08/22/18 [phone]

## 2018-08-22 NOTE — Discharge Instructions (Signed)
Apply ice to the sore area on your neck for 30 minutes at a time, 3-4 times a day.  Take diphenhydramine (Benadryl) as needed for itching.

## 2018-08-22 NOTE — ED Notes (Signed)
Refusing to be placed on the monitor or to get vitals.  States I'm not gonna be hooked up all night.  I want to see a doctor.  Sitting in the chair watching tv at this time.

## 2018-08-22 NOTE — ED Notes (Addendum)
Unable to obtain EKG due to patient refusing. Pt stated "I just had one". Explained to him that we needed one for this visit but he continued to refuse.

## 2018-09-10 ENCOUNTER — Encounter: Payer: Self-pay | Admitting: Pediatric Intensive Care

## 2018-09-10 MED FILL — CYCLOBENZAPRINE 10 MG TAB: 10 | 10 days supply | Qty: 30 | Fill #0

## 2018-09-10 MED FILL — traMADol HCL 50 MG TABS: 50 | 4 days supply | Qty: 15 | Fill #0

## 2018-09-10 MED FILL — PROAIR HFA 90 MCG INHALER: 108 (90 BAS | 17 days supply | Qty: 9 | Fill #0

## 2018-09-10 MED FILL — predniSONE 20 MG TABS: 20 | 15 days supply | Qty: 30 | Fill #0

## 2018-09-27 ENCOUNTER — Emergency Department (HOSPITAL_COMMUNITY)
Admission: EM | Admit: 2018-09-27 | Discharge: 2018-09-27 | Disposition: A | Discharge status: Home / Self Care | Payer: Medicaid Other | Attending: Emergency Medicine | Admitting: Emergency Medicine

## 2018-09-27 ENCOUNTER — Other Ambulatory Visit: Payer: Self-pay

## 2018-09-27 DIAGNOSIS — K0889 Other specified disorders of teeth and supporting structures: Secondary | ICD-10-CM

## 2018-09-27 DIAGNOSIS — K029 Dental caries, unspecified: Secondary | ICD-10-CM | POA: Diagnosis not present

## 2018-09-27 DIAGNOSIS — Z79899 Other long term (current) drug therapy: Secondary | ICD-10-CM | POA: Diagnosis not present

## 2018-09-27 DIAGNOSIS — F17210 Nicotine dependence, cigarettes, uncomplicated: Secondary | ICD-10-CM | POA: Diagnosis not present

## 2018-09-27 DIAGNOSIS — I10 Essential (primary) hypertension: Secondary | ICD-10-CM | POA: Diagnosis not present

## 2018-09-27 DIAGNOSIS — J441 Chronic obstructive pulmonary disease with (acute) exacerbation: Secondary | ICD-10-CM | POA: Insufficient documentation

## 2018-09-27 DIAGNOSIS — R0602 Shortness of breath: Secondary | ICD-10-CM | POA: Diagnosis present

## 2018-09-27 LAB — BASIC METABOLIC PANEL
Anion gap: 9 (ref 5–15)
BUN: 21 mg/dL — AB (ref 6–20)
CALCIUM: 8.6 mg/dL — AB (ref 8.9–10.3)
CO2: 23 mmol/L (ref 22–32)
Chloride: 110 mmol/L (ref 98–111)
Creatinine, Ser: 1.24 mg/dL (ref 0.61–1.24)
GFR calc Af Amer: >60 mL/min (ref >60)
GLUCOSE: 114 mg/dL — AB (ref 70–99)
Potassium: 3.9 mmol/L (ref 3.5–5.1)
Sodium: 142 mmol/L (ref 135–145)

## 2018-09-27 LAB — CBC
HCT: 41.4 % (ref 39.0–52.0)
Hemoglobin: 13.5 g/dL (ref 13.0–17.0)
MCH: 33.3 pg (ref 26.0–34.0)
MCHC: 32.6 g/dL (ref 30.0–36.0)
MCV: 102.2 fL — AB (ref 80.0–100.0)
PLATELETS: 206 10*3/uL (ref 150–400)
RBC: 4.05 MIL/uL — ABNORMAL LOW (ref 4.22–5.81)
RDW: 14.8 % (ref 11.5–15.5)
WBC: 6.8 10*3/uL (ref 4.0–10.5)
nRBC: 0 % (ref 0.0–0.2)

## 2018-09-27 MED ORDER — PREDNISONE 20 MG PO TABS
60.0000 mg | ORAL_TABLET | Freq: Once | ORAL | Status: AC
  Administered 2018-09-27: 60 mg via ORAL
  Filled 2018-09-27: qty 3

## 2018-09-27 MED ORDER — PENICILLIN V POTASSIUM 500 MG PO TABS
500.0000 mg | ORAL_TABLET | Freq: Once | ORAL | Status: AC
  Administered 2018-09-27: 500 mg via ORAL
  Filled 2018-09-27: qty 1

## 2018-09-27 MED ORDER — ALBUTEROL SULFATE HFA 108 (90 BASE) MCG/ACT IN AERS
1.0000 | INHALATION_SPRAY | Freq: Once | RESPIRATORY_TRACT | Status: AC
  Administered 2018-09-27: 1 via RESPIRATORY_TRACT
  Filled 2018-09-27: qty 6.7

## 2018-09-27 MED ORDER — ALBUTEROL (5 MG/ML) CONTINUOUS INHALATION SOLN: 10.0000 mg/h | INHALATION_SOLUTION | RESPIRATORY_TRACT | Status: DC

## 2018-09-27 MED ORDER — IBUPROFEN 800 MG PO TABS
800.0000 mg | ORAL_TABLET | Freq: Once | ORAL | Status: AC
  Administered 2018-09-27: 800 mg via ORAL
  Filled 2018-09-27: qty 1

## 2018-09-27 MED ORDER — IPRATROPIUM BROMIDE 0.02 % IN SOLN: 0.5000 mg | Freq: Once | RESPIRATORY_TRACT | Status: DC

## 2018-09-27 MED ORDER — PREDNISONE 10 MG PO TABS: 40.0000 mg | ORAL_TABLET | Freq: Every day | ORAL | 0 refills | Status: AC

## 2018-09-27 MED ORDER — PENICILLIN V POTASSIUM 500 MG PO TABS: 500.0000 mg | ORAL_TABLET | Freq: Four times a day (QID) | ORAL | 0 refills | Status: AC

## 2018-09-27 MED FILL — predniSONE 10 MG TABS: 10 | 4 days supply | Qty: 16 | Fill #0

## 2018-09-27 MED FILL — PENICILLIN VK 500 MG TABLET: 500 | 7 days supply | Qty: 28 | Fill #0

## 2018-09-27 NOTE — ED Provider Notes (Signed)
[facility]  CSN: [id number] Arrival date & time: 09/27/18 0407  Chief Complaint(s) Shortness of Breath  [name] is a 52 y.o. male    Shortness of Breath  This is a recurrent problem. The average episode lasts 2 days. The problem occurs continuously.The problem has been gradually worsening. Associated symptoms include cough and sputum production. Pertinent negatives include no fever, no rhinorrhea and no hemoptysis. Risk factors include smoking. He has tried beta-agonist inhalers for the symptoms. The treatment provided mild relief. Associated medical issues include asthma and COPD.   Patient also complaining of right lower posterior dental pain.  Past Medical History Past Medical History:  Diagnosis Date  . Asthma   . Bipolar 1 disorder (HCC)   . Depression   . Homelessness   . Hypertension   . Schizoaffective disorder [facility])    Patient Active Problem List   Diagnosis Date Noted  . Asthma 12/26/2016  . Tobacco use disorder 12/26/2016  . Schizophrenia (HCC) 12/26/2016  . Cocaine abuse with cocaine-induced mood disorder (HCC) 09/06/2016  . Alcohol use disorder, mild, abuse 07/31/2016  . Substance induced mood disorder (HCC) 07/30/2016  . Schizoaffective disorder, bipolar type (HCC) 05/31/2016  . Cocaine use disorder, severe, dependence (HCC) 05/31/2016  . Essential hypertension 05/31/2016   Home Medication(s) Prior to Admission medications   Medication Sig Start Date End Date Taking? Authorizing Provider  ADVAIR DISKUS 250-50 MCG/DOSE AEPB Inhale 1 puff into the lungs 2 (two) times daily. 07/13/18  Yes [provider]  albuterol (PROVENTIL HFA;VENTOLIN HFA) 108 (90 Base) MCG/ACT inhaler Inhale 1-2 puffs into the lungs every 4 (four) hours as needed for shortness of breath. 08/22/18  Yes Dione Booze, MD  cyclobenzaprine (FLEXERIL) 10 MG tablet Take 1 tablet (10 mg total) by mouth 3 (three) times daily as  needed for muscle spasms. 08/22/18  Yes Dione Booze, MD  furosemide (LASIX) 20 MG tablet Take 20 mg by mouth daily. 07/14/18  Yes [provider]  lisinopril (PRINIVIL,ZESTRIL) 40 MG tablet Take 1 tablet (40 mg total) by mouth daily. Patient taking differently: Take 20 mg by mouth 2 (two) times daily.  12/30/16  Yes Tomasita Crumble, MD  omeprazole (PRILOSEC) 20 MG capsule Take 20 mg by mouth 2 (two) times daily. 07/13/18  Yes [provider]  QUEtiapine (SEROQUEL) 300 MG tablet Take 600 mg by mouth at bedtime. 07/13/18  Yes [provider]  traMADol (ULTRAM) 50 MG tablet Take 1 tablet (50 mg total) by mouth every 6 (six) hours as needed. Patient taking differently: Take 50 mg by mouth every 6 (six) hours as needed for moderate pain or severe pain.  08/22/18  Yes Dione Booze, MD  traZODone (DESYREL) 150 MG tablet Take 1 tablet (150 mg total) by mouth at bedtime. 12/29/16  Yes Pucilowska, Jolanta B, MD  amLODipine (NORVASC) 10 MG tablet Take 1 tablet (10 mg total) by mouth daily. Patient not taking: Reported on 09/27/2018 12/30/16   Tomasita Crumble, MD  penicillin v potassium (VEETID) 500 MG tablet Take 1 tablet (500 mg total) by mouth 4 (four) times daily for 7 days. 09/27/18 10/04/18  Nira Conn, MD  predniSONE (DELTASONE) 10 MG tablet Take 4 tablets (40 mg total) by mouth daily for 4 days. 09/27/18 10/01/18  Nira Conn, MD  QUEtiapine (SEROQUEL) 200 MG tablet Take 1 tablet (200 mg total) by mouth at bedtime. Patient not taking: Reported on 09/27/2018 12/29/16   Shari Prows, MD  Past Surgical History No past surgical history on file. Family History Family History  Problem Relation Age of Onset  . Drug abuse Mother   . Suicidality Mother     Social History Social History   Tobacco Use  . Smoking status: Current  Every Day Smoker    Types: Cigarettes  . Smokeless tobacco: Never Used  Substance Use Topics  . Alcohol use: Yes    Comment: infrequently  . Drug use: Yes    Types: Cocaine   Allergies Tylenol [acetaminophen]  Review of Systems Review of Systems  Constitutional: Negative for fever.  HENT: Negative for rhinorrhea.   Respiratory: Positive for cough, sputum production and shortness of breath. Negative for hemoptysis.    All other systems are reviewed and are negative for acute change except as noted in the HPI  Physical Exam Vital Signs  I have reviewed the triage vital signs BP (!) 177/120   Pulse 83   Temp 97.6 F (36.4 C) (Oral)   Resp (!) 21   SpO2 100%   Physical Exam  Constitutional: He is oriented to person, place, and time. He appears well-developed and well-nourished. No distress.  HENT:  Head: Normocephalic and atraumatic.  Nose: Nose normal.  Mouth/Throat: Abnormal dentition.    Eyes: Pupils are equal, round, and reactive to light. Conjunctivae and EOM are normal. Right eye exhibits no discharge. Left eye exhibits no discharge. No scleral icterus.  Neck: Normal range of motion. Neck supple.  Cardiovascular: Normal rate and regular rhythm. Exam reveals no gallop and no friction rub.  No murmur heard. Pulmonary/Chest: Effort normal and breath sounds normal. No stridor. No respiratory distress. He has no rales.  Abdominal: Soft. He exhibits no distension. There is no tenderness.  Musculoskeletal: He exhibits no edema or tenderness.  Neurological: He is alert and oriented to person, place, and time.  Skin: Skin is warm and dry. No rash noted. He is not diaphoretic. No erythema.  Psychiatric: He has a normal mood and affect.  Vitals reviewed.   ED Results and Treatments Labs (all labs ordered are listed, but only abnormal results are displayed) Labs Reviewed  BASIC METABOLIC PANEL - Abnormal; Notable for the following components:      Result Value    Glucose, Bld 114 (*)    BUN 21 (*)    Calcium 8.6 (*)    All other components within normal limits  CBC - Abnormal; Notable for the following components:   RBC 4.05 (*)    MCV 102.2 (*)    All other components within normal limits                                                                                                                         EKG  EKG Interpretation  Date/Time:    Ventricular Rate:    PR Interval:    QRS Duration:   QT Interval:    QTC Calculation:   R Axis:  Text Interpretation:        Radiology No results found. Pertinent labs & imaging results that were available during my care of the patient were reviewed by me and considered in my medical decision making (see chart for details).  Medications Ordered in ED Medications  ipratropium (ATROVENT) nebulizer solution 0.5 mg (0 mg Nebulization Hold 09/27/18 0533)  ibuprofen (ADVIL,MOTRIN) tablet 800 mg (800 mg Oral Given 09/27/18 0538)  albuterol (PROVENTIL HFA;VENTOLIN HFA) 108 (90 Base) MCG/ACT inhaler 1 puff (1 puff Inhalation Given 09/27/18 0538)  predniSONE (DELTASONE) tablet 60 mg (60 mg Oral Given 09/27/18 0537)  penicillin v potassium (VEETID) tablet 500 mg (500 mg Oral Given 09/27/18 0538)                                                                                                                                    Procedures Procedures  (including critical care time)  Medical Decision Making / ED Course I have reviewed the nursing notes for this encounter and the patient's prior records (if available in EHR or on provided paperwork).    1. Sob Shortness of breath consistent with asthma/COPD exacerbation.  Patient provided with DuoNeb and oral steroids.  Wheezing improved.  Given additional albuterol inhaler.  Will prescribe steroids..  2.  Dental pain No evidence of drainable abscess.  Provided with penicillin for possible pulpitis.  Final Clinical Impression(s) / ED  Diagnoses Final diagnoses:  COPD exacerbation (HCC)  Pain, dental    Disposition: Discharge  Condition: Good  I have discussed the results, Dx and Tx plan with the patient who expressed understanding and agree(s) with the plan. Discharge instructions discussed at great length. The patient was given strict return precautions who verbalized understanding of the instructions. No further questions at time of discharge.    ED Discharge Orders         Ordered    penicillin v potassium (VEETID) 500 MG tablet  4 times daily     09/27/18 0548    predniSONE (DELTASONE) 10 MG tablet  Daily     09/27/18 0548           Follow Up: Primary care provider   As needed     This chart was dictated using voice recognition software.  Despite best efforts to proofread,  errors can occur which can change the documentation meaning.   Nira Conn, MD 09/27/18 [phone]

## 2018-09-27 NOTE — ED Triage Notes (Signed)
Patient with history of COPD c/o shortness of breath, diminished in all fields with bilateral expiratory wheezes. Patient received 10 mg Albuterol and 0.5 of Atrovent as well as 125 mg Solu medrol IV en route

## 2018-09-27 NOTE — ED Notes (Signed)
Patient discontinued IV, pacing room, states "I have to go"

## 2018-09-27 NOTE — ED Notes (Signed)
Bed: IR67 Expected date:  Expected time:  Means of arrival:  Comments: 52 yo M/shortness of breath

## 2018-10-04 NOTE — Congregational Nurse Program (Signed)
Client was recently released form [facility] in [place]. He has paper prescriptions including prescription for nebulaizer machine. CN will fill prescriptions for client.

## 2019-03-04 ENCOUNTER — Emergency Department (HOSPITAL_COMMUNITY): Payer: Medicaid Other

## 2019-03-04 ENCOUNTER — Other Ambulatory Visit: Payer: Self-pay

## 2019-03-04 ENCOUNTER — Inpatient Hospital Stay (HOSPITAL_COMMUNITY)
Admission: EM | Admit: 2019-03-04 | Discharge: 2019-03-09 | DRG: 292 | Discharge status: Against Medical Advice | Payer: Medicaid Other | Attending: Internal Medicine | Admitting: Internal Medicine

## 2019-03-04 DIAGNOSIS — Z813 Family history of other psychoactive substance abuse and dependence: Secondary | ICD-10-CM

## 2019-03-04 DIAGNOSIS — I472 Ventricular tachycardia: Secondary | ICD-10-CM | POA: Diagnosis not present

## 2019-03-04 DIAGNOSIS — Z888 Allergy status to other drugs, medicaments and biological substances status: Secondary | ICD-10-CM | POA: Diagnosis not present

## 2019-03-04 DIAGNOSIS — F1490 Cocaine use, unspecified, uncomplicated: Secondary | ICD-10-CM | POA: Diagnosis present

## 2019-03-04 DIAGNOSIS — Z79891 Long term (current) use of opiate analgesic: Secondary | ICD-10-CM

## 2019-03-04 DIAGNOSIS — E876 Hypokalemia: Secondary | ICD-10-CM | POA: Diagnosis not present

## 2019-03-04 DIAGNOSIS — F17210 Nicotine dependence, cigarettes, uncomplicated: Secondary | ICD-10-CM

## 2019-03-04 DIAGNOSIS — N179 Acute kidney failure, unspecified: Secondary | ICD-10-CM | POA: Diagnosis present

## 2019-03-04 DIAGNOSIS — Z79899 Other long term (current) drug therapy: Secondary | ICD-10-CM

## 2019-03-04 DIAGNOSIS — J449 Chronic obstructive pulmonary disease, unspecified: Secondary | ICD-10-CM

## 2019-03-04 DIAGNOSIS — J441 Chronic obstructive pulmonary disease with (acute) exacerbation: Secondary | ICD-10-CM

## 2019-03-04 DIAGNOSIS — F250 Schizoaffective disorder, bipolar type: Secondary | ICD-10-CM | POA: Diagnosis present

## 2019-03-04 DIAGNOSIS — I5023 Acute on chronic systolic (congestive) heart failure: Secondary | ICD-10-CM | POA: Diagnosis present

## 2019-03-04 DIAGNOSIS — I110 Hypertensive heart disease with heart failure: Principal | ICD-10-CM

## 2019-03-04 DIAGNOSIS — R0602 Shortness of breath: Secondary | ICD-10-CM | POA: Diagnosis present

## 2019-03-04 DIAGNOSIS — I509 Heart failure, unspecified: Secondary | ICD-10-CM

## 2019-03-04 LAB — I-STAT TROPONIN, ED: Troponin i, poc: 0.04 ng/mL (ref 0.00–0.08)

## 2019-03-04 LAB — CBC WITH DIFFERENTIAL/PLATELET
Abs Immature Granulocytes: 0.03 10*3/uL (ref 0.00–0.07)
Basophils Absolute: 0 10*3/uL (ref 0.0–0.1)
Basophils Relative: 0 %
Eosinophils Absolute: 0 10*3/uL (ref 0.0–0.5)
Eosinophils Relative: 0 %
HCT: 39.8 % (ref 39.0–52.0)
Hemoglobin: 12.7 g/dL — ABNORMAL LOW (ref 13.0–17.0)
Immature Granulocytes: 1 %
Lymphocytes Relative: 11 %
Lymphs Abs: 0.7 10*3/uL (ref 0.7–4.0)
MCH: 32 pg (ref 26.0–34.0)
MCHC: 31.9 g/dL (ref 30.0–36.0)
MCV: 100.3 fL — ABNORMAL HIGH (ref 80.0–100.0)
Monocytes Absolute: 0.2 10*3/uL (ref 0.1–1.0)
Monocytes Relative: 3 %
Neutro Abs: 5.4 10*3/uL (ref 1.7–7.7)
Neutrophils Relative %: 85 %
Platelets: 257 10*3/uL (ref 150–400)
RBC: 3.97 MIL/uL — ABNORMAL LOW (ref 4.22–5.81)
RDW: 17.1 % — ABNORMAL HIGH (ref 11.5–15.5)
WBC: 6.3 10*3/uL (ref 4.0–10.5)
nRBC: 0 % (ref 0.0–0.2)

## 2019-03-04 LAB — BASIC METABOLIC PANEL
Anion gap: 10 (ref 5–15)
BUN: 19 mg/dL (ref 6–20)
CO2: 19 mmol/L — ABNORMAL LOW (ref 22–32)
Calcium: 8.2 mg/dL — ABNORMAL LOW (ref 8.9–10.3)
Chloride: 107 mmol/L (ref 98–111)
Creatinine, Ser: 1.42 mg/dL — ABNORMAL HIGH (ref 0.61–1.24)
GFR calc Af Amer: >60 mL/min (ref >60)
GFR calc non Af Amer: 57 mL/min — ABNORMAL LOW (ref >60)
Glucose, Bld: 210 mg/dL — ABNORMAL HIGH (ref 70–99)
Potassium: 3.9 mmol/L (ref 3.5–5.1)
Sodium: 136 mmol/L (ref 135–145)

## 2019-03-04 LAB — BRAIN NATRIURETIC PEPTIDE: B Natriuretic Peptide: 1361.9 pg/mL — ABNORMAL HIGH (ref 0.0–100.0)

## 2019-03-04 LAB — HEPATIC FUNCTION PANEL
ALT: 24 U/L (ref 0–44)
AST: 35 U/L (ref 15–41)
Albumin: 2.8 g/dL — ABNORMAL LOW (ref 3.5–5.0)
Alkaline Phosphatase: 99 U/L (ref 38–126)
Bilirubin, Direct: 0.4 mg/dL — ABNORMAL HIGH (ref 0.0–0.2)
Indirect Bilirubin: 0.9 mg/dL (ref 0.3–0.9)
Total Bilirubin: 1.3 mg/dL — ABNORMAL HIGH (ref 0.3–1.2)
Total Protein: 5.8 g/dL — ABNORMAL LOW (ref 6.5–8.1)

## 2019-03-04 LAB — MRSA PCR SCREENING: MRSA by PCR: NEGATIVE

## 2019-03-04 IMAGING — DX PORTABLE CHEST - 1 VIEW
1 series · 1 of 1 positions shown · non-contrast
Comparison: [DATE]

CLINICAL DATA: Shortness of breath for a few days, history of
tobacco use

EXAM:
PORTABLE CHEST 1 VIEW

[chest]
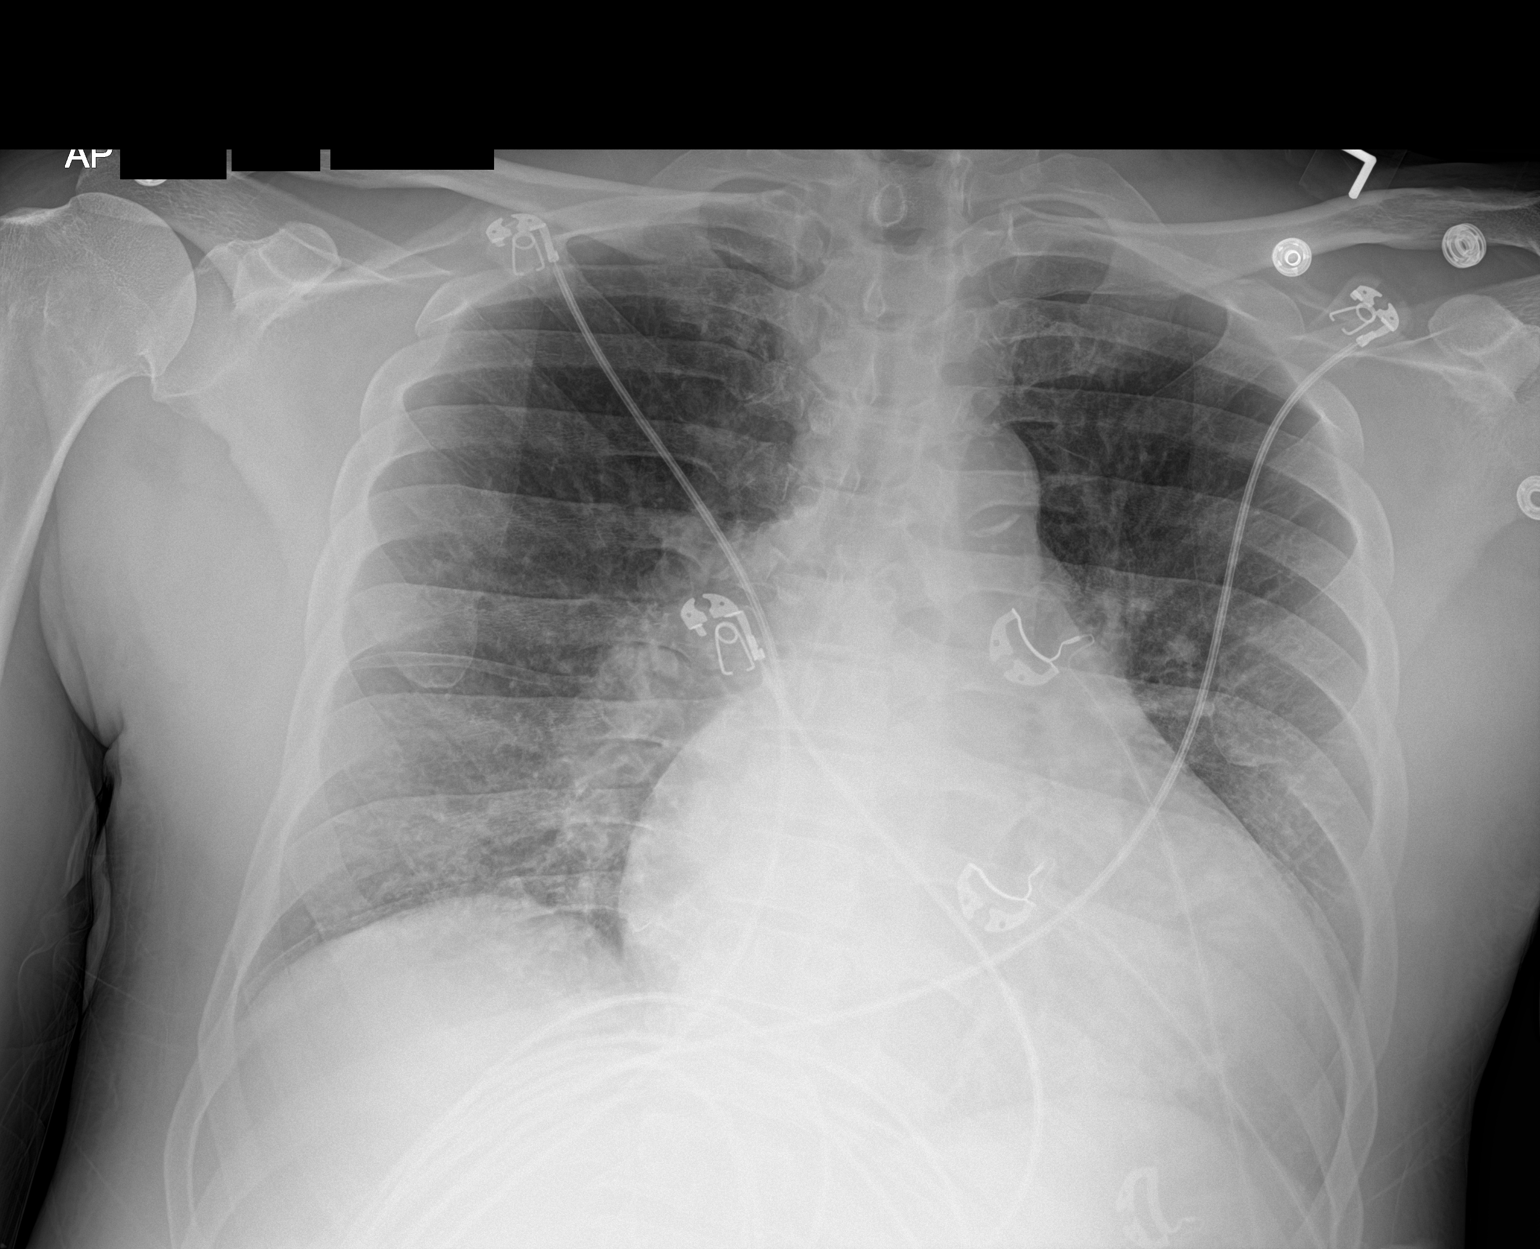

[1 of 1 positions shown; findings below may reference images not displayed]

FINDINGS: Cardiac shadow is enlarged. Aortic calcifications are noted. The
lungs are well aerated bilaterally. No focal confluent infiltrate or
sizable effusion is seen. Mild central vascular prominence is noted
without significant edema.
IMPRESSION: Mild vascular congestion without significant edema.

## 2019-03-04 MED ORDER — FUROSEMIDE 10 MG/ML IJ SOLN
40.0000 mg | Freq: Two times a day (BID) | INTRAMUSCULAR | Status: DC
  Administered 2019-03-05 – 2019-03-06 (×3): 40 mg via INTRAVENOUS
  Filled 2019-03-04 (×3): qty 4

## 2019-03-04 MED ORDER — ACETAMINOPHEN 325 MG PO TABS: 650.0000 mg | ORAL_TABLET | Freq: Four times a day (QID) | ORAL | Status: DC | PRN

## 2019-03-04 MED ORDER — PREDNISONE 20 MG PO TABS
60.0000 mg | ORAL_TABLET | Freq: Once | ORAL | Status: DC
  Filled 2019-03-04: qty 3

## 2019-03-04 MED ORDER — ALBUTEROL SULFATE (2.5 MG/3ML) 0.083% IN NEBU
2.5000 mg | INHALATION_SOLUTION | Freq: Four times a day (QID) | RESPIRATORY_TRACT | Status: DC | PRN
  Administered 2019-03-05 – 2019-03-06 (×2): 2.5 mg via RESPIRATORY_TRACT
  Filled 2019-03-04 (×4): qty 3

## 2019-03-04 MED ORDER — IPRATROPIUM BROMIDE HFA 17 MCG/ACT IN AERS
2.0000 | INHALATION_SPRAY | Freq: Once | RESPIRATORY_TRACT | Status: AC
  Administered 2019-03-04: 2 via RESPIRATORY_TRACT
  Filled 2019-03-04: qty 12.9

## 2019-03-04 MED ORDER — OXYCODONE HCL 5 MG PO TABS
5.0000 mg | ORAL_TABLET | Freq: Once | ORAL | Status: AC
  Administered 2019-03-04: 5 mg via ORAL
  Filled 2019-03-04: qty 1

## 2019-03-04 MED ORDER — ACETAMINOPHEN 650 MG RE SUPP: 650.0000 mg | Freq: Four times a day (QID) | RECTAL | Status: DC | PRN

## 2019-03-04 MED ORDER — FUROSEMIDE 10 MG/ML IJ SOLN
40.0000 mg | Freq: Once | INTRAMUSCULAR | Status: AC
  Administered 2019-03-04: 40 mg via INTRAVENOUS
  Filled 2019-03-04: qty 4

## 2019-03-04 MED ORDER — ENOXAPARIN SODIUM 40 MG/0.4ML ~~LOC~~ SOLN
40.0000 mg | SUBCUTANEOUS | Status: DC
  Administered 2019-03-05 – 2019-03-07 (×2): 40 mg via SUBCUTANEOUS
  Filled 2019-03-04 (×4): qty 0.4

## 2019-03-04 MED ORDER — ALBUTEROL SULFATE HFA 108 (90 BASE) MCG/ACT IN AERS
4.0000 | INHALATION_SPRAY | Freq: Once | RESPIRATORY_TRACT | Status: AC
  Administered 2019-03-04: 4 via RESPIRATORY_TRACT
  Filled 2019-03-04: qty 6.7

## 2019-03-04 MED ORDER — LISINOPRIL 20 MG PO TABS
20.0000 mg | ORAL_TABLET | Freq: Every day | ORAL | Status: DC
  Administered 2019-03-05: 20 mg via ORAL
  Filled 2019-03-04: qty 1

## 2019-03-04 NOTE — ED Notes (Signed)
Pt very agitated with EDP at bedside, states that he is frustrated, hurting & hungry. Pt refuses any Hx of drinking alcohol, & became flustered with each triage & assessment question that was asked of this nurse & the EDP.

## 2019-03-04 NOTE — ED Provider Notes (Signed)
[facility] EMERGENCY DEPARTMENT Provider Note   CSN: [id number] Arrival date & time: 03/04/19  4098    History   Chief Complaint Chief Complaint  Patient presents with   Shortness of Breath   Legs Swollen    [name] is a 53 y.o. male.     [name] is a 53 y.o. male with a history of asthma, tobacco abuse, hypertension, bipolar disorder, schizoaffective disorder and substance abuse, who presents to the emergency department for several days of shortness of breath.  He reports over the past 3 to 4 days he has had increasing shortness of breath both at rest and with activity.  He reports nonproductive cough.  No associated fevers, no recent travel or known sick contacts.  He also reports that over the past several weeks both of his legs have been swelling he has no prior history of leg swelling in the past, no history of CHF.  He reports his abdomen feels somewhat swollen as well he denies any abdominal pain, nausea or vomiting.  He has no associated chest pain.  No lightheadedness or syncope.  He denies cardiac history.  Reports smoking approximately 1 pack/day.  Has not taken any medications to treat symptoms prior to arrival, denies use of inhaler, stating "I do not have asthma so I do not know why I would use an inhaler", although patient has been seen multiple times in the past with similar symptoms and treated for likely COPD exacerbation.  Patient becomes aggravated with further history taking and is not uncooperative.  Patient is now more cooperative and able to provide more history, reports that about a month ago he was in [place] and he was hospitalized and told he had heart failure, he reports that he has intermittently been on Lasix in the past but has not been taking this recently.  He reports swelling started in his legs and progressed up to his genitals and he noted that his penis appeared swollen yesterday and he reports some soreness and  swelling in his abdomen.  He denies a history of cirrhosis and has not drank any alcohol in over 1 month.  He reports he took 40 mg of prednisone that he had been given from a previous hospitalization this morning and has intermittently used inhalers without improvement.     Past Medical History:  Diagnosis Date   Asthma    Bipolar 1 disorder (HCC)    Depression    Homelessness    Hypertension    Schizoaffective disorder [facility])     Patient Active Problem List   Diagnosis Date Noted   Asthma 12/26/2016   Tobacco use disorder 12/26/2016   Schizophrenia (HCC) 12/26/2016   Cocaine abuse with cocaine-induced mood disorder (HCC) 09/06/2016   Alcohol use disorder, mild, abuse 07/31/2016   Substance induced mood disorder (HCC) 07/30/2016   Schizoaffective disorder, bipolar type (HCC) 05/31/2016   Cocaine use disorder, severe, dependence (HCC) 05/31/2016   Essential hypertension 05/31/2016    No past surgical history on file.      Home Medications    Prior to Admission medications   Medication Sig Start Date End Date Taking? Authorizing Provider  ADVAIR DISKUS 250-50 MCG/DOSE AEPB Inhale 1 puff into the lungs 2 (two) times daily. 07/13/18   [provider]  albuterol (PROVENTIL HFA;VENTOLIN HFA) 108 (90 Base) MCG/ACT inhaler Inhale 1-2 puffs into the lungs every 4 (four) hours as needed for shortness of breath. 08/22/18   Dione Booze, MD  amLODipine (NORVASC) 10 MG tablet Take 1 tablet (10 mg total) by mouth daily. Patient not taking: Reported on 09/27/2018 12/30/16   Tomasita Crumble, MD  cyclobenzaprine (FLEXERIL) 10 MG tablet Take 1 tablet (10 mg total) by mouth 3 (three) times daily as needed for muscle spasms. 08/22/18   Dione Booze, MD  furosemide (LASIX) 20 MG tablet Take 20 mg by mouth daily. 07/14/18   [provider]  lisinopril (PRINIVIL,ZESTRIL) 40 MG tablet Take 1 tablet (40 mg total) by mouth daily. Patient taking differently: Take 20 mg by  mouth 2 (two) times daily.  12/30/16   Tomasita Crumble, MD  omeprazole (PRILOSEC) 20 MG capsule Take 20 mg by mouth 2 (two) times daily. 07/13/18   [provider]  QUEtiapine (SEROQUEL) 200 MG tablet Take 1 tablet (200 mg total) by mouth at bedtime. Patient not taking: Reported on 09/27/2018 12/29/16   Pucilowska, Braulio Conte B, MD  QUEtiapine (SEROQUEL) 300 MG tablet Take 600 mg by mouth at bedtime. 07/13/18   [provider]  traMADol (ULTRAM) 50 MG tablet Take 1 tablet (50 mg total) by mouth every 6 (six) hours as needed. Patient taking differently: Take 50 mg by mouth every 6 (six) hours as needed for moderate pain or severe pain.  08/22/18   Dione Booze, MD  traZODone (DESYREL) 150 MG tablet Take 1 tablet (150 mg total) by mouth at bedtime. 12/29/16   Pucilowska, Ellin [name], MD    Family History Family History  Problem Relation Age of Onset   Drug abuse Mother    Suicidality Mother     Social History Social History   Tobacco Use   Smoking status: Current Every Day Smoker    Types: Cigarettes   Smokeless tobacco: Never Used  Substance Use Topics   Alcohol use: Yes    Comment: infrequently   Drug use: Yes    Types: Cocaine     Allergies   Tylenol [acetaminophen]   Review of Systems Review of Systems  Constitutional: Negative for chills and fever.  HENT: Negative for congestion, rhinorrhea and sore throat.   Eyes: Negative for visual disturbance.  Respiratory: Positive for cough and shortness of breath. Negative for chest tightness and wheezing.   Cardiovascular: Positive for leg swelling. Negative for chest pain and palpitations.  Gastrointestinal: Positive for abdominal distention. Negative for abdominal pain, constipation, diarrhea and vomiting.  Genitourinary: Negative for dysuria, flank pain and frequency.  Musculoskeletal: Negative for arthralgias and myalgias.  Skin: Negative for color change and rash.  Neurological: Negative for dizziness, syncope  and light-headedness.     Physical Exam Updated Vital Signs BP (!) 159/122    Pulse 86    Temp (!) 97.2 F (36.2 C) (Oral)    Resp (!) 23    Ht 5\' 9"  (1.753 m)    Wt 86 kg    SpO2 100%    BMI 28.00 kg/m   Physical Exam Vitals signs and nursing note reviewed.  Constitutional:      General: He is not in acute distress.    Appearance: He is well-developed and normal weight. He is not ill-appearing, toxic-appearing or diaphoretic.  HENT:     Head: Normocephalic and atraumatic.     Mouth/Throat:     Pharynx: Oropharynx is clear.  Eyes:     General:        Right eye: No discharge.        Left eye: No discharge.     Pupils: Pupils are equal, round,  and reactive to light.  Neck:     Musculoskeletal: Neck supple.     Trachea: No tracheal deviation.  Cardiovascular:     Rate and Rhythm: Normal rate and regular rhythm.     Pulses: Normal pulses.     Heart sounds: Normal heart sounds. No murmur. No friction rub. No gallop.   Pulmonary:     Effort: Pulmonary effort is normal. Tachypnea present. No respiratory distress.     Breath sounds: Wheezing and rales (In bilateral bases) present.     Comments: Patient is tachypneic with increased respiratory effort, wheezes heard throughout and rales noted in bilateral bases.  Currently satting well on room air. Chest:     Chest wall: No tenderness.  Abdominal:     General: Bowel sounds are normal. There is no distension.     Palpations: Abdomen is soft. There is no mass.     Tenderness: There is no abdominal tenderness. There is no guarding.     Comments: Abdomen mildly distended but soft, bowel sounds present throughout, there is no tenderness or guarding, no palpable masses or rebound tenderness.  Genitourinary:    Comments: Mild penile edema, no significant scrotal swelling, nontender, no discharge. Musculoskeletal:        General: No deformity.     Right lower leg: He exhibits tenderness. Edema present.     Left lower leg: He exhibits  tenderness. Edema present.     Comments: Bilateral lower extremities with 2+ pitting edema up to the level of shin, patient seems to have some more mild edema of the thighs as well.  Skin:    General: Skin is warm and dry.     Capillary Refill: Capillary refill takes less than 2 seconds.  Neurological:     Mental Status: He is alert.     Coordination: Coordination normal.     Comments: Speech is clear, able to follow commands Moves extremities without ataxia, coordination intact       ED Treatments / Results  Labs (all labs ordered are listed, but only abnormal results are displayed) Labs Reviewed  BASIC METABOLIC PANEL - Abnormal; Notable for the following components:      Result Value   CO2 19 (*)    Glucose, Bld 210 (*)    Creatinine, Ser 1.42 (*)    Calcium 8.2 (*)    GFR calc non Af Amer 57 (*)    All other components within normal limits  CBC WITH DIFFERENTIAL/PLATELET - Abnormal; Notable for the following components:   RBC 3.97 (*)    Hemoglobin 12.7 (*)    MCV 100.3 (*)    RDW 17.1 (*)    All other components within normal limits  BRAIN NATRIURETIC PEPTIDE - Abnormal; Notable for the following components:   B Natriuretic Peptide 1,361.9 (*)    All other components within normal limits  HEPATIC FUNCTION PANEL  I-STAT TROPONIN, ED    EKG EKG Interpretation  Date/Time:  Friday March 04 2019 09:56:51 EDT Ventricular Rate:  90 PR Interval:    QRS Duration: 97 QT Interval:  397 QTC Calculation: 486 R Axis:   35 Text Interpretation:  Sinus rhythm LAE, consider biatrial enlargement Abnormal T, consider ischemia, diffuse leads no STEMI, lateral t wave inversions new from previous Confirmed by Arby BarrettePfeiffer, Marcy [id number]) on 03/04/2019 12:54:31 PM   Radiology Dg Chest Port 1 View  Result Date: 03/04/2019 CLINICAL DATA:  Shortness of breath for a few days, history of tobacco use EXAM: PORTABLE  CHEST 1 VIEW COMPARISON:  08/22/2018 FINDINGS: Cardiac shadow is enlarged.  Aortic calcifications are noted. The lungs are well aerated bilaterally. No focal confluent infiltrate or sizable effusion is seen. Mild central vascular prominence is noted without significant edema. IMPRESSION: Mild vascular congestion without significant edema. Electronically Signed   By: Alcide Clever M.D.   On: 03/04/2019 11:12    Procedures Procedures (including critical care time)  Medications Ordered in ED Medications  albuterol (PROVENTIL HFA;VENTOLIN HFA) 108 (90 Base) MCG/ACT inhaler 4 puff (4 puffs Inhalation Given 03/04/19 1104)  ipratropium (ATROVENT HFA) inhaler 2 puff (2 puffs Inhalation Given 03/04/19 0200)  oxyCODONE (Oxy IR/ROXICODONE) immediate release tablet 5 mg (5 mg Oral Given 03/04/19 1138)  furosemide (LASIX) injection 40 mg (40 mg Intravenous Given 03/04/19 1238)     Initial Impression / Assessment and Plan / ED Course  I have reviewed the triage vital signs and the nursing notes.  Pertinent labs & imaging results that were available during my care of the patient were reviewed by me and considered in my medical decision making (see chart for details).  Patient presents to the emergency department for evaluation of worsening dyspnea, this is present on exertion and at rest.  On arrival patient is not hypoxic but he is tachypneic with increased respiratory effort.  Afebrile vitals otherwise stable.  He reports associated swelling in his legs and abdomen.  Also has smoking history.  On exam patient has wheezing present throughout bilateral lungs but also has some rales present in the bases.  2+ edema as well as soft abdominal distention and some mild swelling of the penis suggestive of fluid overload.  Patient denies associated chest pain.  Will check basic labs, hepatic function, troponin, EKG, chest x-ray and BNP.  Will give albuterol and Atrovent, patient reports he took leftover steroids at home this morning prior to coming in.  Hold off on any Lasix until kidney function is  returned.  EKG with some T wave inversions in lateral leads but negative troponin.  No leukocytosis, stable hemoglobin, fattening is slightly elevated from previous, 1.42 today, in October it was 1.24 mild hyperglycemia of 210 with a CO2 of 19, no other acute electrolyte derangements, hepatic function studies unremarkable.  Patient's BNP is significantly elevated at 1361.9, chest x-ray shows mild vascular congestion without significant edema, this in conjunction with patient's obvious fluid overload on exam with edema up to the abdomen suggest CHF exacerbation but patient also has some wheezing and I suspect this is a combination of CHF and COPD.  Given significant edema and BNP elevation with increased work of breathing I feel patient will require admission for monitored diuresis, to ensure his kidney function does not significantly worsen.  Will consult for medical admission.  Case discussed with internal medicine teaching service who will see and admit the patient.  Patient discussed with Dr. Donnald Garre, who saw patient as well and agrees with plan.   Final Clinical Impressions(s) / ED Diagnoses   Final diagnoses:  Acute on chronic congestive heart failure, unspecified heart failure type [name])  COPD exacerbation [facility])    ED Discharge Orders    None       Legrand Rams 03/04/19 1310    Arby Barrette, MD 03/05/19 1027

## 2019-03-04 NOTE — ED Triage Notes (Signed)
BIB [facility], SOB and cough x2 days, no fever. Swollen legs, no hx of CHF. No recent travel or COVID contact.

## 2019-03-04 NOTE — ED Provider Notes (Signed)
Medical screening examination/treatment/procedure(s) were conducted as a shared visit with non-physician practitioner(s) and myself.  I personally evaluated the patient during the encounter.  EKG Interpretation  Date/Time:  Friday March 04 2019 09:56:51 EDT Ventricular Rate:  90 PR Interval:    QRS Duration: 97 QT Interval:  397 QTC Calculation: 486 R Axis:   35 Text Interpretation:  Sinus rhythm LAE, consider biatrial enlargement Abnormal T, consider ischemia, diffuse leads no STEMI, lateral t wave inversions new from previous Confirmed by Arby Barrette [phone]) on 03/04/2019 12:54:31 PM  Patient reports he has been getting increasingly short of breath.  He reports even minor activities in his house make him severely short of breath.  He is also having swelling in both lower legs.  Patient denies any chest pain.  No fever.  Nonproductive dry cough.  No known sick contact.  Patient is alert and nontoxic.  Mild increased work of breathing at rest.  Heart regular.  Wheezing bilaterally with crackles in bases.  2+ pitting edema bilateral lower extremities.  Diagnostic results reviewed.  Findings are suggestive of CHF with likely combination of COPD.  Patient afebrile, nontoxic, no chest pain low immediate suspicion for COVID-19 except in light of current pandemic.Marland Kitchen  [name] was evaluated in Emergency Department on 03/05/2019 for the symptoms described in the history of present illness. He was evaluated in the context of the global COVID-19 pandemic, which necessitated consideration that the patient might be at risk for infection with the SARS-CoV-2 virus that causes COVID-19. Institutional protocols and algorithms that pertain to the evaluation of patients at risk for COVID-19 are in a state of rapid change based on information released by regulatory bodies including the CDC and federal and state organizations. These policies and algorithms were followed during the patient's care in the  ED.                                                              Arby Barrette, MD 03/05/19 1027

## 2019-03-04 NOTE — ED Notes (Signed)
It was reported on pts arrival that he refused an IV with EMS, a PIV was attempted in Rt arm by another RN & pt jerked & the IV was too malpositioned for keep. EDP aware of pt having no access, & meds to be ordered PO.

## 2019-03-04 NOTE — H&P (Addendum)
Date: 03/04/2019               Patient Name:  [name] MRN: [id number]  DOB: 1966-11-30 Age / Sex: 53 y.o., male   PCP: [facility]         Medical Service: Internal Medicine Teaching Service         Attending Physician: Dr. Inez Catalina, MD    First Contact: Dr. Gwyneth Revels Pager: [phone]  Second Contact: Dr. Delma Officer Pager: [phone]       After Hours (After 5p/  First Contact Pager: [phone]  weekends / holidays): Second Contact Pager: [phone]   Chief Complaint: Shortness of breath, cough, leg swelling  History of Present Illness: This is a 53 year old male with a history of COPD, CHF(EF in 2018 was 35 to 40%), bipolar disorder, hypertension, substance use, and schizoaffective disorder who presents with a 1 week history of worsening swelling in his LE up to his abdomen, worsening shortness of breath, productive cough with clear sputum (sometimes yellow), nausea, and NBNB vomiting, last vomitied yesterday.  He reported that he was having shortness of breath for a few years now. He also reported some issues with lying flat, and gets short of breath when he lies flat, he is only using one pillow to sleep with a pillow.  He denies any known sick contacts.  He denied any other symptoms, denies any chest pain, fevers, chills, diarrhea, constipation headaches, lightheadedness or dizziness.   He reports that he is smoking 1 pack per day for years, states that he does not have COPD or asthma.  He reports that he uses cocaine, last use was a few days ago when he does a "couple of lines ".   He reports that he was in [place] a few months ago where he was hospitalized and told he had heart failure.  He reports that he is due to see a cardiologist and for psychiatric issues, states that he was there for about 2 weeks but left AMA.  He reports that he was having shortness of breath at that time however was not having any, he was prescribed Lasix however states that he has not  been taking anything since that time.  On chart review he was admitted at [facility] for a failure exacerbation, he was diuresed with IV Lasix and had an EF of 35 to 40%.  In the ED he was found to be afebrile, tachypneic up to 33, oxygenating 100% on room air, heart rate of 86, hypertensive to 159/107.  Labs are significant for a BNP of 1,361.  Mild macrocytic anemia, hemoglobin 12.7, baseline around 13-14, MCV 100.3.  Hepatic panel showed a mildly elevated T bili of 1.3 and direct bili of 0.4.  Chest x-ray showed a cardiomegaly, bilateral pulmonary congestion, no areas of consolidation.  EKG showed normal sinus rhythm, rate of 90, inverted T waves in leads II, III and aVF.  He was given albuterol, prednisone 60, Oxy IR, and laxis 40 mg. Patient was admitted to internal medicine.   Meds:  No outpatient medications have been marked as taking for the 03/04/19 encounter [facility] Encounter).     Allergies: Allergies as of 03/04/2019 - Review Complete 03/04/2019  Allergen Reaction Noted  . Tylenol [acetaminophen] Hives and Nausea And Vomiting 05/29/2016   Past Medical History:  Diagnosis Date  . Asthma   . Bipolar 1 disorder (HCC)   . Depression   . Homelessness   . Hypertension   .  Schizoaffective disorder (HCC)     Family History: Reports that he does not interact with his family.  Social History: Smokes 1 pack/day for years, does not know how long.  Reports that he only drinks occasionally, 2-3 beers when it is hot out.  Tests cocaine, did a few lines a couple of days ago.  He is on disability.  Lives with friend in [place], is currently in [place] visiting a friend.  He has been here for a few days.  Review of Systems: A complete ROS was negative except as per HPI.   Physical Exam: Blood pressure (!) 157/139, pulse 89, temperature 98 F (36.7 C), temperature source Oral, resp. rate (!) 24, height 5\' 9"  (1.753 m), weight 86 kg, SpO2 100 %. Physical Exam  Constitutional: He  is oriented to person, place, and time and well-developed, well-nourished, and in no distress.  HENT:  Head: Normocephalic and atraumatic.  Eyes: Pupils are equal, round, and reactive to light. Conjunctivae and EOM are normal.  Neck: Normal range of motion. Neck supple. JVD present. No thyromegaly present.  Cardiovascular: Normal rate, regular rhythm and normal heart sounds.  Pulmonary/Chest:  Bilateral wheezing and diffuse crackles on exam  Abdominal: Soft. Bowel sounds are normal.  Edematous abdomen, 1+ edema on posterior aspect. TTP diffusely.   Genitourinary:    Genitourinary Comments: Penis NL, diffuse swelling of scrotal area   Musculoskeletal:        General: Tenderness (TTP) and edema (3+ BL LE edema) present.  Neurological: He is alert and oriented to person, place, and time.  Skin: Skin is warm and dry.  Psychiatric: Mood and affect normal.    EKG: personally reviewed my interpretation is normal sinus rhythm, heart rate around 90, T wave inversions and leads II, III, aVF, V5 and V6, no ST elevations or depressions  CXR: personally reviewed my interpretation is rotated, cardiomegaly, bilateral pulmonary congestion, no areas of consolidation  Assessment & Plan by Problem: Active Problems:   Acute exacerbation of CHF (congestive heart failure) (HCC)  Acute HF exacerbation: This is a 53 year old male with a history of heart failure with reduced EF (EF 35 to 40% in 2018), hypertension, substance use, and COPD who presented with a 1 week of increased swelling in his lower extremities, scrotal area, and abdomen. He also had worsening shortness of breath, productive cough(mostly clear, sometimes yellow), nausea and vomiting. He was found to have HF in 2018 and seemed to have been recently hospitalized a few months ago in [place] for a HF exacerbation. He had not been taking any lasix and had been increasing his fluid intake. He was found to be tachypneic and hypertensive, oxygenating  well. On exam he had 3+ BL LE edema, bilateral crackles, and +JVD. Labs are significant for an elevated BNP greater than 1000.  EKG showed some new T wave inversions inferiorly. This appears to be an acute HF exacerbation, possibly 2/2 his recent cocaine use. I am less suspicious of an infectious etiology since he has been afebrile, has had no leukocytosis, and CXR showed no areas of consolidation.  -Strict I's and O's -Status post Lasix 40 mg -Daily weights -Repeat echocardiogram -F/u troponin -Repeat EKG in AM -CBC and BMP in AM  AKI:  -Cr was 1.42 on admission, base line of around 1.2. This could be related to his HF exacerbation however patient will need to have diuresis to improve his breathing. Will continue to monitor this for now.  -F/u U/A -CMP in AM  Hyperbilirubinemia: -  Hepatic function panel showed mildy elevated t bili to 1.3 and direct bilirubin to 0.4. Will repeat labs tomorrow and further evaluate if they worsen.  -CMP in AM  ?COPD: -He reported that he did not have COPD, in the chart he has advair and albuterol. No PFTs in the chart. On exam he has some diffuse wheezing. CXR did not show signs of COPD. He has already received 1 dose of prednisone in the ED. His worsening SOB seems to be more related to a HF exacerbation so I will hold off on any more prednisone for now.   -Start albuterol PRN -Will need OP PFTs on follow up  HTN: He reports that he in on lisinopril at home. He reported that he takes 20 mg BID. Blood pressures have been elevated while here.  -Start lisinopril 10 mg daily   Bipolar Schizoaffective disorder: -Patient is on quetiapine at home. He is also on trazadone however reports that he wasn't taking his medications.   FEN: Fluid restriction, replete lytes prn, heart heatlhy diet VTE ppx: Lovenox  Code Status: FULL    Dispo: Admit patient to Inpatient with expected length of stay greater than 2 midnights.  Signed: Claudean Severance, MD  03/04/2019, 3:18 PM  Pager: [phone]

## 2019-03-04 NOTE — ED Notes (Signed)
ED TO INPATIENT HANDOFF REPORT  ED Nurse Name and Phone #: Gabriel Rung, RN   S Name/Age/Gender [name] 53 y.o. male Room/Bed: 020C/020C  Code Status   Code Status: Full Code  Home/SNF/Other Home Patient oriented to: self, place, time and situation Is this baseline? Yes   Triage Complete: Triage complete  Chief Complaint SOB, cough- no fever  Triage Note BIB [facility], SOB and cough x2 days, no fever. Swollen legs, no hx of CHF. No recent travel or COVID contact.    Allergies Allergies  Allergen Reactions  . Acetaminophen Hives, Nausea And Vomiting and Rash    Pt overdosed on bottle of APAP 05/06/16   . Peanut Butter Flavor Itching and Nausea And Vomiting    Level of Care/Admitting Diagnosis ED Disposition    ED Disposition Condition Comment   Admit  Hospital Area: [facility] [[id number]]  Level of Care: Progressive [102]  Diagnosis: Acute exacerbation of CHF (congestive heart failure) (HCC) [732202]  Admitting Physician: Nena Polio  Attending Physician: Nena Polio  Estimated length of stay: 3 - 4 days  Certification:: I certify this patient will need inpatient services for at least 2 midnights  PT Class (Do Not Modify): Inpatient [101]  PT Acc Code (Do Not Modify): Private [1]       B Medical/Surgery History Past Medical History:  Diagnosis Date  . Asthma   . Bipolar 1 disorder (HCC)   . Depression   . Homelessness   . Hypertension   . Schizoaffective disorder (HCC)    No past surgical history on file.   A IV Location/Drains/Wounds Patient Lines/Drains/Airways Status   Active Line/Drains/Airways    Name:   Placement date:   Placement time:   Site:   Days:   Peripheral IV 03/04/19 Right Antecubital   03/04/19    1233    Antecubital   less than 1          Intake/Output Last 24 hours  Intake/Output Summary (Last 24 hours) at 03/04/2019 1606 Last data filed at 03/04/2019 1434 Gross per 24 hour  Intake -   Output 1925 ml  Net -1925 ml    Labs/Imaging Results for orders placed or performed during the hospital encounter of 03/04/19 (from the past 48 hour(s))  Basic metabolic panel     Status: Abnormal   Collection Time: 03/04/19 10:34 AM  Result Value Ref Range   Sodium 136 135 - 145 mmol/L   Potassium 3.9 3.5 - 5.1 mmol/L   Chloride 107 98 - 111 mmol/L   CO2 19 (L) 22 - 32 mmol/L   Glucose, Bld 210 (H) 70 - 99 mg/dL   BUN 19 6 - 20 mg/dL   Creatinine, Ser 5.42 (H) 0.61 - 1.24 mg/dL   Calcium 8.2 (L) 8.9 - 10.3 mg/dL   GFR calc non Af Amer 57 (L) >60 mL/min   GFR calc Af Amer >60 >60 mL/min   Anion gap 10 5 - 15    Comment: Performed at [facility], [address] [place], [name] [address]  CBC with Differential     Status: Abnormal   Collection Time: 03/04/19 10:34 AM  Result Value Ref Range   WBC 6.3 4.0 - 10.5 K/uL   RBC 3.97 (L) 4.22 - 5.81 MIL/uL   Hemoglobin 12.7 (L) 13.0 - 17.0 g/dL   HCT 76.2 83.1 - 51.7 %   MCV 100.3 (H) 80.0 - 100.0 fL   MCH 32.0  26.0 - 34.0 pg   MCHC 31.9 30.0 - 36.0 g/dL   RDW 76.8 (H) 08.8 - 11.0 %   Platelets 257 150 - 400 K/uL   nRBC 0.0 0.0 - 0.2 %   Neutrophils Relative % 85 %   Neutro Abs 5.4 1.7 - 7.7 K/uL   Lymphocytes Relative 11 %   Lymphs Abs 0.7 0.7 - 4.0 K/uL   Monocytes Relative 3 %   Monocytes Absolute 0.2 0.1 - 1.0 K/uL   Eosinophils Relative 0 %   Eosinophils Absolute 0.0 0.0 - 0.5 K/uL   Basophils Relative 0 %   Basophils Absolute 0.0 0.0 - 0.1 K/uL   Immature Granulocytes 1 %   Abs Immature Granulocytes 0.03 0.00 - 0.07 K/uL    Comment: Performed at [facility], [address], [place], [name] [address]  Brain natriuretic peptide     Status: Abnormal   Collection Time: 03/04/19 10:35 AM  Result Value Ref Range   B Natriuretic Peptide 1,361.9 (H) 0.0 - 100.0 pg/mL    Comment: Performed at [facility], [address], [place], [name] [address]  I-stat troponin, ED     Status: None   Collection  Time: 03/04/19 10:50 AM  Result Value Ref Range   Troponin i, poc 0.04 0.00 - 0.08 ng/mL   Comment 3            Comment: Due to the release kinetics of cTnI, a negative result within the first hours of the onset of symptoms does not rule out myocardial infarction with certainty. If myocardial infarction is still suspected, repeat the test at appropriate intervals.   Hepatic function panel     Status: Abnormal   Collection Time: 03/04/19 11:55 AM  Result Value Ref Range   Total Protein 5.8 (L) 6.5 - 8.1 g/dL   Albumin 2.8 (L) 3.5 - 5.0 g/dL   AST 35 15 - 41 U/L   ALT 24 0 - 44 U/L   Alkaline Phosphatase 99 38 - 126 U/L   Total Bilirubin 1.3 (H) 0.3 - 1.2 mg/dL   Bilirubin, Direct 0.4 (H) 0.0 - 0.2 mg/dL   Indirect Bilirubin 0.9 0.3 - 0.9 mg/dL    Comment: Performed at [facility], [address] [place], [name] [address]   Dg Chest Port 1 View  Result Date: 03/04/2019 CLINICAL DATA:  Shortness of breath for a few days, history of tobacco use EXAM: PORTABLE CHEST 1 VIEW COMPARISON:  08/22/2018 FINDINGS: Cardiac shadow is enlarged. Aortic calcifications are noted. The lungs are well aerated bilaterally. No focal confluent infiltrate or sizable effusion is seen. Mild central vascular prominence is noted without significant edema. IMPRESSION: Mild vascular congestion without significant edema. Electronically Signed   By: Alcide Clever M.D.   On: 03/04/2019 11:12    Pending Labs Unresulted Labs (From admission, onward)    Start     Ordered   03/05/19 0500  Comprehensive metabolic panel  Tomorrow morning,   R     03/04/19 1549   03/05/19 0500  CBC  Tomorrow morning,   R     03/04/19 1549   03/05/19 0500  HIV antibody (Routine Testing)  Tomorrow morning,   R     03/04/19 1600   03/04/19 1650  Troponin I - Once  Once,   R     03/04/19 1549          Vitals/Pain Today's Vitals   03/04/19 1413 03/04/19 1423  03/04/19 1430 03/04/19 1436  BP: (!) 151/99  (!) 157/139 (!)  157/139  Pulse:    89  Resp: 19  (!) 29 (!) 24  Temp:    98 F (36.7 C)  TempSrc:    Oral  SpO2:    100%  Weight:      Height:      PainSc:  6   7     Isolation Precautions No active isolations  Medications Medications  furosemide (LASIX) injection 40 mg (has no administration in time range)  lisinopril (PRINIVIL,ZESTRIL) tablet 20 mg (has no administration in time range)  enoxaparin (LOVENOX) injection 30 mg (has no administration in time range)  acetaminophen (TYLENOL) tablet 650 mg (has no administration in time range)    Or  acetaminophen (TYLENOL) suppository 650 mg (has no administration in time range)  albuterol (PROVENTIL) (2.5 MG/3ML) 0.083% nebulizer solution 2.5 mg (has no administration in time range)  albuterol (PROVENTIL HFA;VENTOLIN HFA) 108 (90 Base) MCG/ACT inhaler 4 puff (4 puffs Inhalation Given 03/04/19 1104)  ipratropium (ATROVENT HFA) inhaler 2 puff (2 puffs Inhalation Given 03/04/19 0200)  oxyCODONE (Oxy IR/ROXICODONE) immediate release tablet 5 mg (5 mg Oral Given 03/04/19 1138)  furosemide (LASIX) injection 40 mg (40 mg Intravenous Given 03/04/19 1238)    Mobility walks Low fall risk   Focused Assessments Cardiac Assessment Handoff:  Cardiac Rhythm: Normal sinus rhythm Lab Results  Component Value Date   TROPONINI <0.03 12/25/2016   No results found for: DDIMER Does the Patient currently have chest pain? No     R Recommendations: See Admitting Provider Note  Report given to:   Additional Notes: Pt gets easily irritated when he is in pain &/or hungry, when both has been taken care of he is much more pleasant.

## 2019-03-04 NOTE — ED Notes (Addendum)
Attempted to call repot to [phone]

## 2019-03-04 NOTE — ED Notes (Signed)
Pt refused blood work. Pt asked why they were drawing so much blood work. Then pt started yelling at phlebotomy.

## 2019-03-04 NOTE — ED Notes (Signed)
EDP at bedside, Pt is a poor history informant & cussing out in frustration when the PA exited room.

## 2019-03-04 NOTE — ED Notes (Signed)
Pt refused to ambulate for staff.

## 2019-03-05 ENCOUNTER — Other Ambulatory Visit (HOSPITAL_COMMUNITY): Payer: Self-pay

## 2019-03-05 DIAGNOSIS — J441 Chronic obstructive pulmonary disease with (acute) exacerbation: Secondary | ICD-10-CM

## 2019-03-05 LAB — CBC
HCT: 39.4 % (ref 39.0–52.0)
Hemoglobin: 13.2 g/dL (ref 13.0–17.0)
MCH: 32 pg (ref 26.0–34.0)
MCHC: 33.5 g/dL (ref 30.0–36.0)
MCV: 95.6 fL (ref 80.0–100.0)
Platelets: 265 K/uL (ref 150–400)
RBC: 4.12 MIL/uL — ABNORMAL LOW (ref 4.22–5.81)
RDW: 16.4 % — ABNORMAL HIGH (ref 11.5–15.5)
WBC: 9.3 K/uL (ref 4.0–10.5)
nRBC: 0 % (ref 0.0–0.2)

## 2019-03-05 LAB — COMPREHENSIVE METABOLIC PANEL
ALT: 22 U/L (ref 0–44)
AST: 22 U/L (ref 15–41)
Albumin: 2.6 g/dL — ABNORMAL LOW (ref 3.5–5.0)
Alkaline Phosphatase: 87 U/L (ref 38–126)
Anion gap: 10 (ref 5–15)
BUN: 16 mg/dL (ref 6–20)
CO2: 22 mmol/L (ref 22–32)
Calcium: 8.6 mg/dL — ABNORMAL LOW (ref 8.9–10.3)
Chloride: 108 mmol/L (ref 98–111)
Creatinine, Ser: 1.38 mg/dL — ABNORMAL HIGH (ref 0.61–1.24)
GFR calc Af Amer: >60 mL/min (ref >60)
GFR calc non Af Amer: 59 mL/min — ABNORMAL LOW (ref >60)
Glucose, Bld: 100 mg/dL — ABNORMAL HIGH (ref 70–99)
Potassium: 3.8 mmol/L (ref 3.5–5.1)
Sodium: 140 mmol/L (ref 135–145)
Total Bilirubin: 1 mg/dL (ref 0.3–1.2)
Total Protein: 5.7 g/dL — ABNORMAL LOW (ref 6.5–8.1)

## 2019-03-05 LAB — HIV ANTIBODY (ROUTINE TESTING W REFLEX): HIV Screen 4th Generation wRfx: NONREACTIVE

## 2019-03-05 LAB — TROPONIN I: Troponin I: 0.03 ng/mL

## 2019-03-05 MED ORDER — LISINOPRIL 40 MG PO TABS
40.0000 mg | ORAL_TABLET | Freq: Every day | ORAL | Status: DC
  Administered 2019-03-06 – 2019-03-09 (×4): 40 mg via ORAL
  Filled 2019-03-05 (×4): qty 1

## 2019-03-05 MED ORDER — IBUPROFEN 600 MG PO TABS
600.0000 mg | ORAL_TABLET | Freq: Four times a day (QID) | ORAL | Status: DC | PRN
  Administered 2019-03-05 – 2019-03-06 (×3): 600 mg via ORAL
  Filled 2019-03-05 (×3): qty 1

## 2019-03-05 MED ORDER — IPRATROPIUM BROMIDE HFA 17 MCG/ACT IN AERS: 2.0000 | INHALATION_SPRAY | Freq: Four times a day (QID) | RESPIRATORY_TRACT | Status: DC

## 2019-03-05 MED ORDER — AEROCHAMBER PLUS FLO-VU MEDIUM MISC
1.0000 | Freq: Once | Status: DC
  Filled 2019-03-05: qty 1

## 2019-03-05 MED ORDER — IPRATROPIUM BROMIDE 0.02 % IN SOLN
0.5000 mg | Freq: Four times a day (QID) | RESPIRATORY_TRACT | Status: DC
  Administered 2019-03-05: 0.5 mg via RESPIRATORY_TRACT
  Filled 2019-03-05: qty 2.5

## 2019-03-05 MED ORDER — IPRATROPIUM BROMIDE 0.02 % IN SOLN
0.5000 mg | Freq: Four times a day (QID) | RESPIRATORY_TRACT | Status: DC | PRN
  Administered 2019-03-06 – 2019-03-08 (×2): 0.5 mg via RESPIRATORY_TRACT
  Filled 2019-03-05 (×2): qty 2.5

## 2019-03-05 NOTE — Progress Notes (Signed)
Pt refused numerous times to stand to be weighed. Educated on why daily weights are necessary. Patient continued to refuse.

## 2019-03-05 NOTE — Progress Notes (Signed)
Subjective: [name] was doing well today, no acute events overnight. He states that his bilateral legs have been sore. He has been needing to spit up clear mucus.  He reports some abdominal pain. He was asking to be put on a low salt diet instead of the heart healthy diet. Last night the nurses reported that he was asking for double portions, we discussed that due to his current condition we are trying to limit his sodium intake and fluid intake and that we should stick to a normal portion and low sodium diet to try and get the fluid off faster. We discussed the plan for today and he is in agreement.   Objective:  Vital signs in last 24 hours: Vitals:   03/04/19 1700 03/04/19 1746 03/04/19 2007 03/05/19 0623  BP: (!) 154/93 (!) 148/107 (!) 154/101 (!) 164/120  Pulse:  88 88 96  Resp: (!) 25     Temp:  97.6 F (36.4 C) 97.6 F (36.4 C) 98 F (36.7 C)  TempSrc:  Oral Oral Oral  SpO2:  98% 99% 100%  Weight:  93.7 kg    Height:  5\' 9"  (1.753 m)      General: Well appearing male, NAD, resting comfortably Cardiac: RRR, no m/r/g, JVD difficult to appreciate Pulmonary: Diffuse wheezing, normal work of breathing, minimal crackles Abdomen: Soft, TTP diffusely but >RUQ area, no guarding or rebound, normoactive BS, improved posterior edema Extremity: 2+ BL LE edema, TTP over mid-shin, warm extremities  Assessment/Plan:  Active Problems:   Acute exacerbation of CHF (congestive heart failure) (HCC) This is a 53 year old male with a history of heart failure with reduced EF (EF 35 to 40% in 2018), hypertension, substance use, and COPD who presented with a 1 week of increased swelling in his lower extremities, scrotal area, and abdomen. He also had worsening shortness of breath, productive cough(mostly clear, sometimes yellow), nausea and vomiting.  Found to be fluid overloaded on exam, BNP of 1300.  He was admitted for an acute heart failure exacerbated started on diuresis.  Acute on chronic  heart failure exacerbation: -Patient received 40 mg Lasix yesterday, he has had good output of 2.3 L, intake was not recorded accurately.  His weight was 206 pounds yesterday, he is using weights to be taken today.  On exam he has improvement of his pulmonary exam, lower extremities are now 2+ edema, and no appreciable hepatojugular reflux today, JVD is difficult to observe.  Still appears to be fluid overloaded we will continue to diurese him now, will develop a new dry weight.  -Continue Lasix 40 mg twice daily -Strict I's and O's -Daily weights -Daily BMP and CBC -Increase lisinopril to 40 mg daily -We will need to have beta-blocker added on once acute exacerbation has resolved  HTN: He reports that he in on lisinopril at home. He reported that he takes 20 mg BID. Blood pressures have been elevated while here.  -Increase lisinopril to 40 mg daily   AKI: -Creatinine was 1.52 on admission, baseline around 1.2.  Improvement today to 1.38.  Could be related to his heart failure exacerbation. -Trend creatinine  Hyperbilirubinemia: -Hepatic function panel showed mildy elevated t bili to 1.3 and direct bilirubin to 0.4.  Today is T bili improved to 1.0.  Could be related to his heart failure.  Continue to monitor.  ?COPD: -Patient continues to have wheezing on exam, he present is not taking any medications for COPD at home.  Started on albuterol as needed  yesterday -Continue albuterol PRN -Start ipratropium q6 hr -Will need OP PFTs   Bipolar Schizoaffective disorder: -Patient is not on any medications at home, he states that he had been on quetiapine before but does not take anything now.  FEN: no fluids, replete lytes prn, Low sodium diet  VTE ppx: Lovenox  Code Status: FULL    Dispo: Anticipated discharge in approximately 2-3 day(s).   Claudean Severance, MD 03/05/2019, 6:47 AM Pager: [phone]

## 2019-03-05 NOTE — Progress Notes (Signed)
Patients leads came off, went in room to fix them. Patient got mad for being woken up, stated not wanting the leads back on him. Stated not caring anymore and he just wanting to sleep. Paged IM, got orders to discontinue cardiac monitoring for tonight. Will pass on to day shift to try again. Will continue to monitor patient.

## 2019-03-05 NOTE — Progress Notes (Signed)
Pt had a 5 beat run of V-tach. Asymptomatic. Dr. Dortha Schwalbe updated. No new orders.

## 2019-03-05 NOTE — Progress Notes (Signed)
CRITICAL VALUE ALERT  Critical Value:  Troponin 0.03   Date & Time Notied:  03/05/2019 0420  Provider Notified: [name] Snare   Orders Received/Actions taken:

## 2019-03-05 NOTE — Progress Notes (Signed)
  Date: 03/05/2019  Patient name: [name]  Medical record number: [id number]  Date of birth: 1966-03-01   I have seen and evaluated this patient and I have discussed the plan of care with the house staff. Please see Dr. Sylvester Harder note for complete details. I concur with her findings and plan.    Patient was already requesting to go home this morning.  I am not sure he will remain in house for completion of his diuresis.  Will need daily counseling re: dangers of not being treated for his heart failure.  Consider not starting BB given ongoing cocaine use.   Inez Catalina, MD 03/05/2019, 8:02 PM

## 2019-03-06 DIAGNOSIS — F1190 Opioid use, unspecified, uncomplicated: Secondary | ICD-10-CM

## 2019-03-06 LAB — COMPREHENSIVE METABOLIC PANEL
ALT: 20 U/L (ref 0–44)
AST: 21 U/L (ref 15–41)
Albumin: 2.5 g/dL — ABNORMAL LOW (ref 3.5–5.0)
Alkaline Phosphatase: 82 U/L (ref 38–126)
Anion gap: 7 (ref 5–15)
BUN: 18 mg/dL (ref 6–20)
CO2: 24 mmol/L (ref 22–32)
Calcium: 8.3 mg/dL — ABNORMAL LOW (ref 8.9–10.3)
Chloride: 109 mmol/L (ref 98–111)
Creatinine, Ser: 1.61 mg/dL — ABNORMAL HIGH (ref 0.61–1.24)
GFR calc Af Amer: 57 mL/min — ABNORMAL LOW (ref >60)
GFR calc non Af Amer: 49 mL/min — ABNORMAL LOW (ref >60)
Glucose, Bld: 84 mg/dL (ref 70–99)
Potassium: 3.2 mmol/L — ABNORMAL LOW (ref 3.5–5.1)
Sodium: 140 mmol/L (ref 135–145)
Total Bilirubin: 0.6 mg/dL (ref 0.3–1.2)
Total Protein: 5.3 g/dL — ABNORMAL LOW (ref 6.5–8.1)

## 2019-03-06 LAB — CBC
HCT: 37.6 % — ABNORMAL LOW (ref 39.0–52.0)
Hemoglobin: 12.1 g/dL — ABNORMAL LOW (ref 13.0–17.0)
MCH: 31.3 pg (ref 26.0–34.0)
MCHC: 32.2 g/dL (ref 30.0–36.0)
MCV: 97.4 fL (ref 80.0–100.0)
Platelets: 231 10*3/uL (ref 150–400)
RBC: 3.86 MIL/uL — ABNORMAL LOW (ref 4.22–5.81)
RDW: 17 % — ABNORMAL HIGH (ref 11.5–15.5)
WBC: 6.2 10*3/uL (ref 4.0–10.5)
nRBC: 0 % (ref 0.0–0.2)

## 2019-03-06 MED ORDER — IPRATROPIUM-ALBUTEROL 0.5-2.5 (3) MG/3ML IN SOLN
3.0000 mL | Freq: Four times a day (QID) | RESPIRATORY_TRACT | Status: DC
  Administered 2019-03-06: 3 mL via RESPIRATORY_TRACT
  Filled 2019-03-06: qty 3

## 2019-03-06 MED ORDER — IPRATROPIUM-ALBUTEROL 0.5-2.5 (3) MG/3ML IN SOLN: 3.0000 mL | Freq: Four times a day (QID) | RESPIRATORY_TRACT | Status: DC

## 2019-03-06 MED ORDER — IPRATROPIUM-ALBUTEROL 20-100 MCG/ACT IN AERS: 1.0000 | INHALATION_SPRAY | Freq: Four times a day (QID) | RESPIRATORY_TRACT | Status: DC

## 2019-03-06 MED ORDER — AMLODIPINE BESYLATE 5 MG PO TABS
5.0000 mg | ORAL_TABLET | Freq: Every day | ORAL | Status: DC
  Administered 2019-03-06 – 2019-03-07 (×2): 5 mg via ORAL
  Filled 2019-03-06 (×2): qty 1

## 2019-03-06 MED ORDER — FUROSEMIDE 10 MG/ML IJ SOLN
40.0000 mg | Freq: Every day | INTRAMUSCULAR | Status: DC
  Administered 2019-03-07: 40 mg via INTRAVENOUS
  Filled 2019-03-06: qty 4

## 2019-03-06 MED ORDER — IPRATROPIUM-ALBUTEROL 0.5-2.5 (3) MG/3ML IN SOLN
3.0000 mL | Freq: Two times a day (BID) | RESPIRATORY_TRACT | Status: DC
  Administered 2019-03-06 – 2019-03-08 (×5): 3 mL via RESPIRATORY_TRACT
  Filled 2019-03-06 (×5): qty 3

## 2019-03-06 MED ORDER — RAMELTEON 8 MG PO TABS
8.0000 mg | ORAL_TABLET | Freq: Every evening | ORAL | Status: DC | PRN
  Administered 2019-03-06: 8 mg via ORAL
  Filled 2019-03-06 (×2): qty 1

## 2019-03-06 MED ORDER — IPRATROPIUM-ALBUTEROL 20-100 MCG/ACT IN AERS
1.0000 | INHALATION_SPRAY | Freq: Four times a day (QID) | RESPIRATORY_TRACT | Status: DC
  Filled 2019-03-06: qty 4

## 2019-03-06 MED ORDER — HYDROXYZINE HCL 25 MG PO TABS
25.0000 mg | ORAL_TABLET | Freq: Three times a day (TID) | ORAL | Status: DC | PRN
  Administered 2019-03-06: 25 mg via ORAL
  Filled 2019-03-06: qty 1

## 2019-03-06 NOTE — Progress Notes (Signed)
Pt removed his IV out.  Stating it was hurting.  Encouraged him to ambulate.  He refused to ambulate stating his feet hurts.  Administered pain med.  Hinton Dyer, RN

## 2019-03-06 NOTE — Progress Notes (Signed)
Patient stated wanting to get up and walk around his room. Reviewed safety plan with patient. Patient disagreed and refused to get gown tied to ease his walk. Patient pacing room. Will continue to monitor patient.

## 2019-03-06 NOTE — Progress Notes (Signed)
Patient stated having sob. Upon assessment patient sob, tachypneic, 100% Room air, clear bilateral lung sounds. Gave prn albuterol breathing treatment. Notified respiratory. After breathing treatment, patient still working to breathing, 100% RA, clear lung sounds. Patient stated feeling anxious and agitated. Stated that I made him agitated because I asked too many questions. Educated patient on the importance of asking questions. Placed patient back on tele. HR 80s, BP 163/102. Called IM, got orders for hydroxyzine. Will continue to monitor patient.

## 2019-03-06 NOTE — Progress Notes (Signed)
Patient requesting RN to page MD; would like something for sleep/to relax him tonight at bedtime.  Per patient hydroxyzine last night not effective.  Most recent blood pressure 155/126.  RN text paged [facility] with this information.

## 2019-03-06 NOTE — Progress Notes (Addendum)
   Subjective:   Patient states that he feels that his lower extremity swelling has improved. He feels that his breathing is still labored. He is having a hard time moving about the room due to breathing. He also complains of pain around his bilateral ankles.   Overnight patient was feeling agitated overnight and was given hydoxyzine. Patient was noted to have a 5beat run of vtach yesterday evening.   Objective:  Vital signs in last 24 hours: Vitals:   03/05/19 1333 03/05/19 2100 03/05/19 2234 03/06/19 0431  BP: (!) 166/114 (!) 149/83  (!) 163/102  Pulse: 96 94 88 85  Resp:   18   Temp: 98 F (36.7 C) 98 F (36.7 C)  98.3 F (36.8 C)  TempSrc: Oral Oral  Oral  SpO2: 100% 99% 99% 100%  Weight:    90.1 kg  Height:       Physical Exam  Constitutional: He appears well-developed and well-nourished. No distress.  HENT:  Head: Normocephalic and atraumatic.  Eyes: Conjunctivae are normal.  Cardiovascular: Normal rate, regular rhythm and normal heart sounds.  Respiratory: Effort normal. No respiratory distress. He has wheezes (throughout lung fields).  GI: Soft. Bowel sounds are normal. He exhibits no distension. There is no abdominal tenderness.  Musculoskeletal:        General: Tenderness (around bilateral ankles ) present.  Neurological: He is alert.  Skin: He is not diaphoretic.  Psychiatric: He has a normal mood and affect. His behavior is normal. Judgment and thought content normal.   Assessment/Plan:  Mr. Detzler is a 52y.o m with HFrEF (EF 35-40% per echo 2018), OUD, HTN, COPD who presented with dypnea, peripheral edema, and productive cough. Treating for heart failure exacerbation.    Acute on chronic heart failure exacerbation  Patient was given lasix 40mg  bid yesterday which was increased from home dose of lasix 20mg  bid. He currently weighs 90kg down from 93.7kg on admission 4/3. He has put out a net 1.9L since admission. Patient appears to be euvolemic on exam. We did  not get repeat echo during this hospitalization to limit exposure to patient and also as it will not change management at this time.   -continue lasix 40mg  qd for maintenance  -low salt diet with fluid restriction   HTN Patient's blood pressure has been ranging 150-160/80-110 over the past 24 hours.   -continue lisinopril 40mg  qd  -add amlodipine 5mg  qd  AKI Creatinine has increased to 1.6 from prior 1.38. Patient likely has achieved dry weight and will now place him on maintenance diuretics.   Probable COPD Patient has persistent wheezing throughout lung fields. Will schedule combivent q6hrs for respiratory distress.   -continue ipratropium q6hrs prn -continue albuterol prn  -Will need PFTs done outpatient as none seen on EMR -please educate patient on how to use spacer with inhaler  Dispo: Anticipated discharge in approximately 1-2 day(s).   Lorenso Courier, MD 03/06/2019, 7:54 AM Pager: [phone]

## 2019-03-06 NOTE — Progress Notes (Signed)
Patient stated needing breathing treatment. Upon assessment patient tachypneic 98 % RA, diminished bilateral lung sounds. Patient given breathing treatments by nurse and respiratory. Will continue to monitor patient.

## 2019-03-06 NOTE — Progress Notes (Signed)
Pt c/o difficulty breathing.  Refused to check pulse Ox.  Hinton Dyer, RN

## 2019-03-07 DIAGNOSIS — E876 Hypokalemia: Secondary | ICD-10-CM

## 2019-03-07 LAB — BASIC METABOLIC PANEL
Anion gap: 11 (ref 5–15)
BUN: 18 mg/dL (ref 6–20)
CO2: 28 mmol/L (ref 22–32)
Calcium: 8.9 mg/dL (ref 8.9–10.3)
Chloride: 103 mmol/L (ref 98–111)
Creatinine, Ser: 1.36 mg/dL — ABNORMAL HIGH (ref 0.61–1.24)
GFR calc Af Amer: >60 mL/min (ref >60)
GFR calc non Af Amer: >60 mL/min (ref >60)
Glucose, Bld: 198 mg/dL — ABNORMAL HIGH (ref 70–99)
Potassium: 3.6 mmol/L (ref 3.5–5.1)
Sodium: 142 mmol/L (ref 135–145)

## 2019-03-07 MED ORDER — FUROSEMIDE 10 MG/ML IJ SOLN
40.0000 mg | Freq: Two times a day (BID) | INTRAMUSCULAR | Status: DC
  Filled 2019-03-07: qty 4

## 2019-03-07 MED ORDER — IPRATROPIUM-ALBUTEROL 0.5-2.5 (3) MG/3ML IN SOLN
3.0000 mL | Freq: Once | RESPIRATORY_TRACT | Status: AC
  Administered 2019-03-07: 3 mL via RESPIRATORY_TRACT
  Filled 2019-03-07: qty 3

## 2019-03-07 MED ORDER — AMLODIPINE BESYLATE 10 MG PO TABS
10.0000 mg | ORAL_TABLET | Freq: Every day | ORAL | Status: DC
  Administered 2019-03-08 – 2019-03-09 (×2): 10 mg via ORAL
  Filled 2019-03-07 (×2): qty 1

## 2019-03-07 MED ORDER — DM-GUAIFENESIN ER 30-600 MG PO TB12
1.0000 | ORAL_TABLET | Freq: Two times a day (BID) | ORAL | Status: DC | PRN
  Administered 2019-03-07: 1 via ORAL
  Filled 2019-03-07: qty 1

## 2019-03-07 MED ORDER — LIDOCAINE 5 % EX PTCH
1.0000 | MEDICATED_PATCH | Freq: Every day | CUTANEOUS | Status: DC | PRN
  Administered 2019-03-07 (×2): 1 via TRANSDERMAL
  Filled 2019-03-07 (×2): qty 1

## 2019-03-07 MED ORDER — POTASSIUM CHLORIDE CRYS ER 20 MEQ PO TBCR: 40.0000 meq | EXTENDED_RELEASE_TABLET | Freq: Two times a day (BID) | ORAL | Status: DC

## 2019-03-07 NOTE — Progress Notes (Signed)
Subjective: [name] was doing okay today, overnight he was having some tachypnea and shortness of breath and was given another breathing treatment, started on mucinex and given a lidocaine patch for back pain. Today he reports that he is still having shortness of breath, he states that his lower extremity swelling has improved and that he has been able to walk around now. We discussed the importance of daily weight and measuring his urine output and he expressed understanding. We discussed that a low sodium diet would be the best for his condition but he was requesting a regular diet, we discussed that he will likely need more medications if he has a regular diet and he expressed understanding. We discussed the plan for today and he is in agreement.    Objective:  Vital signs in last 24 hours: Vitals:   03/06/19 0835 03/06/19 2018 03/07/19 0124 03/07/19 0500  BP: (!) 176/111 (!) 155/126  (!) 157/144  Pulse:  99    Resp:   20   Temp:  97.8 F (36.6 C)    TempSrc:  Oral    SpO2:   100%   Weight:      Height:        General: Well appearing male, NAD, resting comfortably Cardiac: RRR, no m/r/g Pulmonary: Diffuse rales, normal work of breathing Abdomen: Soft, TTP in epigastric area, normoactive bowel sounds Extremity: 1+ BL LE edema in the ankles   Assessment/Plan:  Active Problems:   Acute exacerbation of CHF (congestive heart failure) (HCC)   COPD exacerbation (HCC)  This is a 53 year old male with a history of heart failure with reduced EF(EF 35 to 40% in 2018),hypertension,substance use, and COPD who presented with a1 week ofincreased swelling in his lower extremities, scrotal area, and abdomen.He also had worsening shortness of breath, productive cough(mostly clear, sometimes yellow), nausea and vomiting.  Found to be fluid overloaded on exam, BNP of 1300.  He was admitted for an acute heart failure exacerbated started on diuresis.  Acute on chronic heart failure  exacerbation: -Lasix was decreased to 40 mg daily yesterday. Only recorded output of 250 cc however the nurse reports that he has been urinating more. Patient continues to refuse weight, however weight is down to 90.1 yesterday. On exam he has diffuse rales bilaterally, his lower extremity edema has improved significantly, he is oxygenating well. Bedside ultrasound was done that showed B lines throughout the lungs, and a decreased EF. He had a small bump in his Cr today to 1.6. However since he is still fluid overloaded and is symptomatic we will need to continue to diuresis him. -Lasix 40 mg BID -Strict I+Os -Daily weights -Daily BMP -Continue lisinopril 40 mg daily -Switch to regular diet, will likely need further diuresis   HTN: Blood pressures have been elevated, amlodipine was added yesterday. Will monitor for now, may need another BP medication.  -Continue lisinopril 40 mg daily -Increase amlodipine to 10 mg daily  AKI: Cr has improved to 1.3 today, from 1.6 yesterday. His diuresis may have been too aggressive the day prior. He is still volume overloaded and will need further diuresis, will continue to monitor for now.  -Trend Cr  Probable COPD:  -No PFTs in chart however patient continues to have wheezing on exam. He is on ipratropium, albuterol and duonebs scheduled.  -Will need PFTs outpatient -continue Duonebs scheduled -Continue albuterol and ipratropium PRN  Hypokalemia: -K today was 3.6, from 3.2 yesterday. Starting a regular diet today, will continue to  monitor for now.   -BMP in AM  FEN: No fluids, replete lytes prn, regular diet VTE ppx: Lovenox  Code Status: FULL    Dispo: Anticipated discharge in approximately 2-3 day(s).   Claudean Severance, MD 03/07/2019, 6:52 AM Pager: [phone]

## 2019-03-07 NOTE — TOC Initial Note (Signed)
Transition of Care [facility]) - Initial/Assessment Note    Patient Details  Name: [name] MRN: [id number] Date of Birth: 11-24-1966  Transition of Care [facility]) CM/SW Contact:    Abigail Butts, [name] Phone Number: 03/07/2019, 4:23 PM  Clinical Narrative: CSW spoke to patient on the phone. Patient alert and oriented. CSW assessed patient's living situation. Patient reported he was staying with friends in [place]. He had come out to [place] to visit another friend. Patient is interested in a shelter in [place]. CSW provided shelter list to RN to give to patient. The [employer] bus is currently free, and patient states the [employer] bus to [place] is also free, as it is what he used to get [employer]. No other needs identified at this time. CSW will sign off.                  Expected Discharge Plan: Homeless Shelter Barriers to Discharge: Continued Medical Work up   Patient Goals and CMS Choice Patient states their goals for this hospitalization and ongoing recovery are:: go to shelter in [place]   Choice offered to / list presented to : NA  Expected Discharge Plan and Services Expected Discharge Plan: Homeless Shelter     Post Acute Care Choice: NA Living arrangements for the past 2 months: No permanent address                          Prior Living Arrangements/Services Living arrangements for the past 2 months: No permanent address Lives with:: Self Patient language and need for interpreter reviewed:: Yes              Criminal Activity/Legal Involvement Pertinent to Current Situation/Hospitalization: No - Comment as needed  Activities of Daily Living      Permission Sought/Granted                  Emotional Assessment   Attitude/Demeanor/Rapport: Engaged Affect (typically observed): Accepting, Calm, Appropriate Orientation: : Oriented to Self, Oriented to Place, Oriented to Situation, Oriented to  Time Alcohol / Substance Use: Not Applicable Psych Involvement: No  (comment)  Admission diagnosis:  COPD exacerbation (HCC) [J44.1] Acute on chronic congestive heart failure, unspecified heart failure type [facility]) [I50.9] Patient Active Problem List   Diagnosis Date Noted  . COPD exacerbation (HCC)   . Acute exacerbation of CHF (congestive heart failure) (HCC) 03/04/2019  . Asthma 12/26/2016  . Tobacco use disorder 12/26/2016  . Schizophrenia (HCC) 12/26/2016  . Cocaine abuse with cocaine-induced mood disorder (HCC) 09/06/2016  . Alcohol use disorder, mild, abuse 07/31/2016  . Substance induced mood disorder (HCC) 07/30/2016  . Schizoaffective disorder, bipolar type (HCC) 05/31/2016  . Cocaine use disorder, severe, dependence (HCC) 05/31/2016  . Essential hypertension 05/31/2016   PCP:  Medicine, [facility]:   [id number] - [place], [place] - [address] AT [facility] [address] [name] [address] Phone: [phone] Fax: [phone]     Social Determinants of Health (SDOH) Interventions    Readmission Risk Interventions No flowsheet data found.

## 2019-03-07 NOTE — Progress Notes (Signed)
Patient complaining of shortness of breath.  Respirations per RN count at 20, oxygen saturation 100% on room air.  Patient had a Duoneb treatment at 2313.  Patient would like Doctor to come to bedside so patient can discuss shortness of breath with Doctor.  Internal Medicine Residency paged with this information.

## 2019-03-07 NOTE — Progress Notes (Signed)
Paged by RN that patient endorsed persistent dyspnea with a RR of 20 and 100% O2 sat on room air. Evaluated the patient at bedside. [name] states that he continues to feel wheezy and have dyspnea. He endorses cough, but states that he can't cough up the mucous he feels in his chest. He also reports pain in his right upper back. He last had a duoneb treatment about 3 hours ago. He states the treatments help temporarily, but do not last.   On exam, [name] is resting comfortably in bed and is in no distress. He is not tachypneic. He has bilateral expiratory wheezes, consistent with the day team's documented exam yesterday. He has ttp of the right upper paraspinal muscles, possibly secondary to muscular strain from cough.   His next duoneb isn't scheduled until 8am. Will give an extra treatment now.   Plan - Duoneb treatment - Mucinex - Lidocaine patch for back pain

## 2019-03-07 NOTE — Discharge Summary (Signed)
Name: [name] MRN: [id number] DOB: 11/18/1966 53 y.o. PCP: Medicine, [facility] Family  Date of Admission: 03/04/2019  9:56 AM Date of Discharge: 03/09/2019 Attending Physician: Carlynn Purl C  Discharge Diagnosis: 1. Acute on chronic heart failure exacerbation 2. HTN 3. AKI 4. Probable COPD 5. Hypokalemia  Discharge Medications: Allergies as of 03/09/2019      Reactions   Acetaminophen Hives, Nausea And Vomiting, Rash   Pt overdosed on bottle of APAP 05/06/16   Peanut Butter Flavor Itching, Nausea And Vomiting   Patient doesn't recognize, but it's been in Epic since 2019 (leaving it)   Latex Rash      Medication List    STOP taking these medications   QUEtiapine 200 MG tablet Commonly known as:  SEROQUEL   QUEtiapine 300 MG 24 hr tablet Commonly known as:  SEROQUEL XR   traMADol 50 MG tablet Commonly known as:  ULTRAM   traZODone 150 MG tablet Commonly known as:  DESYREL     TAKE these medications   Advair Diskus 250-50 MCG/DOSE Aepb Generic drug:  Fluticasone-Salmeterol Inhale 1 puff into the lungs 2 (two) times daily.   albuterol 108 (90 Base) MCG/ACT inhaler Commonly known as:  PROVENTIL HFA;VENTOLIN HFA Inhale 1-2 puffs into the lungs every 4 (four) hours as needed for shortness of breath.   amLODipine 10 MG tablet Commonly known as:  NORVASC Take 1 tablet (10 mg total) by mouth daily.   carvedilol 6.25 MG tablet Commonly known as:  COREG Take 1 tablet (6.25 mg total) by mouth 2 (two) times daily with a meal.   cyclobenzaprine 10 MG tablet Commonly known as:  FLEXERIL Take 1 tablet (10 mg total) by mouth 3 (three) times daily as needed for muscle spasms.   furosemide 20 MG tablet Commonly known as:  LASIX Take 2 tablets (40 mg total) by mouth daily. What changed:  how much to take   ipratropium 0.02 % nebulizer solution Commonly known as:  ATROVENT Take 2.5 mLs (0.5 mg total) by nebulization every 6 (six) hours as needed for wheezing or  shortness of breath.   lisinopril 40 MG tablet Commonly known as:  PRINIVIL,ZESTRIL Take 1 tablet (40 mg total) by mouth daily. What changed:    medication strength  how much to take  when to take this   omeprazole 20 MG capsule Commonly known as:  PRILOSEC Take 20 mg by mouth 2 (two) times daily.            Durable Medical Equipment  (From admission, onward)         Start     Ordered   03/09/19 0000  DME Nebulizer machine    Question:  Patient needs a nebulizer to treat with the following condition  Answer:  COPD (chronic obstructive pulmonary disease) (HCC)   03/09/19 0913          Disposition and follow-up:   [name] was discharged from [facility] in Stable condition.  At the hospital follow up visit please address:  1. Acute on chronic heart failure exacerbation: Weight was 83.2 kg the day before discharge, he did not want this rechecked day of discharge.  We were going to prescribe Lasix 40 mg daily, Coreg 6.25 twice daily, lisinopril 40 mg daily and amlodipine 10 mg daily however he did not wait for his prescriptions are medications.  Please consider restarting these.   HTN: Patient was started on Coreg, amlodipine, and lisinopril.  He did not  get these prescriptions, please consider starting these medications.  AKI: Cr down to 1.22, please recheck labs Probable COPD: Will need PFTs, continued duonebs and albuterol on discharge Hypokalemia: Repeat labs  2.  Labs / imaging needed at time of follow-up: CBC, BMP, Echocardiogram  3.  Pending labs/ test needing follow-up: None  Follow-up Appointments: Follow-up Information    Medicine, [facility] Family Follow up.   Specialty:  Family Medicine Why:  Please follow up with this PCP or your new PCP in about 1-2 weeks Contact information: [address] [name] [address] [phone]           Hospital Course by problem list: 1. Acute on chronic heart failure  exacerbation: This is a 53 year old male with a history of heart failure with reduced EF (EF 35 to 40% in 2018), hypertension, substance use, and COPD who presented with a 1 week of increased swelling in his lower extremities, scrotal area, and abdomen. He also had worsening shortness of breath, productive cough(mostly clear, sometimes yellow), nausea and vomiting. He had recently been admitted for HF exacerbation in [place] but left AMA with no prescription for lasix, he also noted increased fluid intake. He was found to be tachypneic and hypertensive, oxygenating well. On exam he had 3+ BL LE edema, bilateral crackles, and +JVD. Labs are significant for an elevated BNP greater than 1000. Patient was diuresed with IV lasix and had good response. He had improvement in his symptoms and weight came down to 83.2 kg. He was having some hypertension and we started HF medications including lisinopril, Coreg and amlodipine.  His labs are stable and he was here.  Patient was medically stable for discharge and prescriptions were already printed however patient did not want to wait and left before he was able to get his medications.  No numbers were in the chart to contact patient.  2. HTN: States that he was on lisinopril at home, blood pressures are elevated and we restarted his lisinopril.  Also added Coreg and amlodipine.  Patient left before he was able to get his medications prescriptions.  3. AKI: Cr was 1.42 on admission, base line of around 1.2. Resolved to 1.22 on discharge.  4. Probable COPD: No PFTs in chart, he was on Advair and albuterol at home.  He was having some diffuse wheezing and received 1 dose of prednisone in the ED, this appeared to be related to the HF exacerbation.   5. Hypokalemia: K went down to 3.6 and this was repleated. Stable on discharge.  Discharge Vitals:   BP (!) 147/100 (BP Location: Right Arm)   Pulse 84   Temp 97.8 F (36.6 C) (Oral)   Resp 16   Ht 5\' 9"  (1.753 m)   Wt  83.2 kg   SpO2 98%   BMI 27.10 kg/m   Pertinent Labs, Studies, and Procedures:  CBC Latest Ref Rng & Units 03/06/2019 03/05/2019 03/04/2019  WBC 4.0 - 10.5 K/uL 6.2 9.3 6.3  Hemoglobin 13.0 - 17.0 g/dL 12.1(L) 13.2 12.7(L)  Hematocrit 39.0 - 52.0 % 37.6(L) 39.4 39.8  Platelets 150 - 400 K/uL 231 265 257   BMP Latest Ref Rng & Units 03/08/2019 03/07/2019 03/06/2019  Glucose 70 - 99 mg/dL 97 165(B) 84  BUN 6 - 20 mg/dL 17 18 18   Creatinine 0.61 - 1.24 mg/dL 9.03 8.33(X) 8.32(N)  Sodium 135 - 145 mmol/L 142 142 140  Potassium 3.5 - 5.1 mmol/L 3.6 3.6 3.2(L)  Chloride 98 - 111  mmol/L 105 103 109  CO2 22 - 32 mmol/L 25 28 24   Calcium 8.9 - 10.3 mg/dL 0.6(O) 8.9 1.5(I)     Discharge Instructions: Discharge Instructions    (HEART FAILURE PATIENTS) Call MD:  Anytime you have any of the following symptoms: 1) 3 pound weight gain in 24 hours or 5 pounds in 1 week 2) shortness of breath, with or without a dry hacking cough 3) swelling in the hands, feet or stomach 4) if you have to sleep on extra pillows at night in order to breathe.   Complete by:  As directed    Call MD for:  difficulty breathing, headache or visual disturbances   Complete by:  As directed    Call MD for:  extreme fatigue   Complete by:  As directed    Call MD for:  hives   Complete by:  As directed    Call MD for:  persistant dizziness or light-headedness   Complete by:  As directed    Call MD for:  persistant nausea and vomiting   Complete by:  As directed    Call MD for:  redness, tenderness, or signs of infection (pain, swelling, redness, odor or green/yellow discharge around incision site)   Complete by:  As directed    Call MD for:  severe uncontrolled pain   Complete by:  As directed    Call MD for:  temperature >100.4   Complete by:  As directed    DME Nebulizer machine   Complete by:  As directed    Patient needs a nebulizer to treat with the following condition:  COPD (chronic obstructive pulmonary disease) (HCC)    Diet - low sodium heart healthy   Complete by:  As directed    Discharge instructions   Complete by:  As directed    [name],   It has been a pleasure working with you and we are glad you're feeling better. You were hospitalized for heart failure.   For your heart failure,  START taking Lasix 40 mg daily START taking lisinopril 40 mg daily START taking COREG 6.25 mg twice a day Continue taking your amlodipine 10 mg daily Please follow up with your new primary care provider, especially if you gain >3lbs in one day or >5lbs in 2 days, develop more swelling in your legs, or start having more shortness of breath   I have given you a prescription for the nebulizer solution, please continue taking your Advair twice daily  Follow up with your primary care provider in 1-2 weeks  If your symptoms worsen or you develop new symptoms, please seek medical help whether it is your primary care provider or emergency department   Increase activity slowly   Complete by:  As directed       Signed: Claudean Severance, MD 03/10/2019, 12:29 PM   Pager: [phone]

## 2019-03-08 DIAGNOSIS — M549 Dorsalgia, unspecified: Secondary | ICD-10-CM

## 2019-03-08 DIAGNOSIS — R062 Wheezing: Secondary | ICD-10-CM

## 2019-03-08 DIAGNOSIS — M25511 Pain in right shoulder: Secondary | ICD-10-CM

## 2019-03-08 LAB — BASIC METABOLIC PANEL
Anion gap: 12 (ref 5–15)
BUN: 17 mg/dL (ref 6–20)
CO2: 25 mmol/L (ref 22–32)
Calcium: 8.6 mg/dL — ABNORMAL LOW (ref 8.9–10.3)
Chloride: 105 mmol/L (ref 98–111)
Creatinine, Ser: 1.22 mg/dL (ref 0.61–1.24)
GFR calc Af Amer: >60 mL/min (ref >60)
GFR calc non Af Amer: >60 mL/min (ref >60)
Glucose, Bld: 97 mg/dL (ref 70–99)
Potassium: 3.6 mmol/L (ref 3.5–5.1)
Sodium: 142 mmol/L (ref 135–145)

## 2019-03-08 MED ORDER — FUROSEMIDE 10 MG/ML IJ SOLN
40.0000 mg | Freq: Two times a day (BID) | INTRAMUSCULAR | Status: DC
  Administered 2019-03-08: 40 mg via INTRAVENOUS
  Filled 2019-03-08 (×2): qty 4

## 2019-03-08 MED ORDER — FUROSEMIDE 10 MG/ML IJ SOLN
40.0000 mg | Freq: Once | INTRAMUSCULAR | Status: AC
  Administered 2019-03-08: 40 mg via INTRAVENOUS
  Filled 2019-03-08: qty 4

## 2019-03-08 MED ORDER — CARVEDILOL 3.125 MG PO TABS
3.1250 mg | ORAL_TABLET | Freq: Two times a day (BID) | ORAL | Status: DC
  Administered 2019-03-08 (×2): 3.125 mg via ORAL
  Filled 2019-03-08 (×3): qty 1

## 2019-03-08 MED ORDER — METOPROLOL TARTRATE 12.5 MG HALF TABLET: 12.5000 mg | ORAL_TABLET | Freq: Two times a day (BID) | ORAL | Status: DC

## 2019-03-08 MED ORDER — DICLOFENAC SODIUM 1 % TD GEL
2.0000 g | Freq: Four times a day (QID) | TRANSDERMAL | Status: DC
  Administered 2019-03-08 (×2): 2 g via TOPICAL
  Filled 2019-03-08: qty 100

## 2019-03-08 NOTE — Progress Notes (Signed)
Subjective: [name] states that his SOB has improved somewhat but he did receive a breathing treatment today. He has also had a productive cough with clear sputum. He reports that he has been able to walk around with no issues. He reports that he is still having some upper back pain, he reports that the lidocaine patch does help, we discussed that we will try a muscle relaxer and see how that helps. We discussed the importance of having his weights checked and checking his vitals and he expressed understanding. We discussed the plan for today and he is in agreement.   Objective:  Vital signs in last 24 hours: Vitals:   03/07/19 2059 03/08/19 0723 03/08/19 0726 03/08/19 0850  BP: (!) 158/110     Pulse:   94   Resp:   16   Temp:      TempSrc:      SpO2:  98% 98%   Weight:    83.2 kg  Height:        General: Well appearing male, NAD, resting comfortably Cardiac: RRR, no m/r/g Pulmonary: Bilateral wheezing, minimal rales, normal work of breathing Abdomen: Soft, non-tender, non-distended Extremity: No LE edema, mild swelling around ankles  Assessment/Plan:  Active Problems:   Acute exacerbation of CHF (congestive heart failure) (HCC)   COPD exacerbation (HCC)  This is a 53 year old male with a history of heart failure with reduced EF(EF 35 to 40% in 2018),hypertension,substance use, and COPD who presented with a1 week ofincreased swelling in his lower extremities, scrotal area, and abdomen.He also had worsening shortness of breath, productive cough(mostly clear, sometimes yellow), nausea and vomiting.Found to be fluid overloaded on exam,BNP of 1300. He was admitted for an acute heart failure exacerbated started on diuresis.  Acute on chronic heart failure exacerbation: -Lasix was increased back up to 40 mg BID yesterday. He has had a recorded output of 700 cc, intake of 1,200 so a net + of 500 cc. He was still refusing weights, discussed with him the importance of  checking his weights and getting vitals and he expressed understanding. Weight was measured today and was 183.5 lbs (down from 206.6 lbs). On exam he has minimal rales bilaterally, some wheezing, and no LE edema. POCUS today still showed diffuse B lines in lung fields, no pleural effusion, moderate reduced systolic function, and dilated. Patient reports some improvement in his breathing, however since he is still fluid overloaded we will need to continue diuresis. He is on a regular diet at this time, we discussed that this is not counter productive in heart failure however he was insistent that he had a regular diet, we will need to increase diuresis in the hospital -Continue lasix 40 mg BID, may need to add an extra dose later today -Strict I+Os -Daily weights -Lisinopril 40 mg daily -Start Coreg 3.125 BID -Daily BMP  HTN: Blood pressures continue to be elevated, amlodipine was increased to 10 mg daily yesterday. Patient has a history of heart failure and a BB will be added on discharge. Continue to monitor for now.   -Continue lisinopril 40 mg daily -Continue amlodipine 10 mg daily -Add coreg 3.125 BID for his HF  AKI: Resolved, Cr is 1.22 today. He is still volume overloaded and will need further diuresis, will continue to monitor for now.  -Trend Cr  Back pain: -Reports some upper back pain over the right shoulder, he reports that it is worse with deep breaths. He had a lidocaine patched placed a few  nights ago that he states didn't help very much. This may be some muscle tenderness.  -Add Voltaren gel  Probable COPD:  -No PFTs in chart however patient continues to have wheezing on exam. He is on ipratropium, albuterol and duonebs scheduled.  -continue Duonebs scheduled -Continue albuterol and ipratropium PRN -Will need PFTs outpatient  Hypokalemia: -K today was 3.6.  -BMP in AM  FEN: No fluids, replete lytes prn, regular diet VTE ppx: Lovenox  Code Status: FULL    Dispo:  Anticipated discharge in approximately 1-2 day(s).   Claudean Severance, MD 03/08/2019, 9:17 AM Pager: [phone]

## 2019-03-08 NOTE — Progress Notes (Signed)
Nurse Tech in to obtain patient's morning weight and patient stated if he was going to get on the scale he needed a breathing treatement due to shortness of breath.  RN in to administer breathing treatment and obtain weight.  RN into room and stated, I've got your breathing treatment and the scale, let's get your weight and then the breathing treatment".  Patient looked at RN and did not respond.  RN explained to patient that the scale was at the bedside and RN had brought a breathing treatment as well.  Patient stated "I'm good" and then closed eyes.  No signs or symptoms of respiratory distress noted per RN.  Patient would only allow Nurse Tech to take blood pressure last night and stated he would not allow any of his vitals to be taken again the rest of the shift.

## 2019-03-09 MED ORDER — CARVEDILOL 6.25 MG PO TABS
6.2500 mg | ORAL_TABLET | Freq: Two times a day (BID) | ORAL | Status: DC
  Administered 2019-03-09: 6.25 mg via ORAL

## 2019-03-09 MED ORDER — FUROSEMIDE 20 MG PO TABS: 40.0000 mg | ORAL_TABLET | Freq: Every day | ORAL | 0 refills | Status: DC

## 2019-03-09 MED ORDER — CARVEDILOL 6.25 MG PO TABS: 6.2500 mg | ORAL_TABLET | Freq: Two times a day (BID) | ORAL | 0 refills | Status: DC

## 2019-03-09 MED ORDER — IPRATROPIUM BROMIDE 0.02 % IN SOLN: 0.5000 mg | Freq: Four times a day (QID) | RESPIRATORY_TRACT | 0 refills | Status: DC | PRN

## 2019-03-09 MED ORDER — LISINOPRIL 40 MG PO TABS: 40.0000 mg | ORAL_TABLET | Freq: Every day | ORAL | 0 refills | Status: DC

## 2019-03-09 NOTE — Discharge Instructions (Signed)
[name],   It has been a pleasure working with you and we are glad you're feeling better. You were hospitalized for heart failure.   For your heart failure,  START taking Lasix 40 mg daily START taking lisinopril 40 mg daily START taking COREG 6.25 mg twice a day Continue taking your amlodipine 10 mg daily Please follow up with your new primary care provider, especially if you gain >3lbs in one day or >5lbs in 2 days, develop more swelling in your legs, or start having more shortness of breath   I have given you a prescription for the nebulizer solution, please continue taking your Advair twice daily  Follow up with your primary care provider in 1-2 weeks  If your symptoms worsen or you develop new symptoms, please seek medical help whether it is your primary care provider or emergency department

## 2019-03-09 NOTE — Progress Notes (Signed)
Subjective: He reports minimal shortness of breath when going to the bathroom. He denies chest pain. He has had a good appetite. He reports that he is very frustrated with the nursing staff and is ready to go home. He does not have a scale at home and is unable to weight himself at night. He is willing to follow-up with a new PCP (list given). We discussed that we will be starting him on new medications and that we will write what he needs to take in the discharge summary. We discussed that we should get a weight before we discharge him to see where his dry weight may be and he was agreeable to this.  Addendum: Nurse reported that patient did not want to wait and was wanting to leave. I completed the discharge paperwork and prescriptions however patient did not want to wait for this to be printed and patient left with no prescriptions.    Objective:  Vital signs in last 24 hours: Vitals:   03/08/19 0726 03/08/19 0850 03/08/19 2014 03/08/19 2109  BP:    (!) 147/100  Pulse: 94  90 84  Resp: 16  16   Temp:      TempSrc:      SpO2: 98%     Weight:  83.2 kg    Height:        General: Lying in bed in no acute distress Cardiac: Normal rate, regular rhythm Pulmonary: CTABL, no wheezing or rhonchi Abdomen: Soft, non-tender, non-distended Extremity: No LE edema Psychiatry: He does appear frustrated and short this morning. He otherwise has normal affect and behavior.     Assessment/Plan:  Active Problems:   Acute exacerbation of CHF (congestive heart failure) (HCC)   COPD exacerbation (HCC)  This is a 53 year old male with a history of heart failure with reduced EF(EF 35 to 40% in 2018),hypertension,substance use, and COPD who presented with a1 week ofincreased swelling in his lower extremities, scrotal area, and abdomen.He also had worsening shortness of breath, productive cough(mostly clear, sometimes yellow), nausea and vomiting.Found to be fluid overloaded on exam,BNP of  1300. He was admitted for an acute heart failure exacerbated started on diuresis.  Acute on chronic heart failure exacerbation: Patient has been diuresing well, net output of 2.7 L yesterday. Weight was 83.2 kg yesterday, he declined weights today. He received 2 doses of 40 mg IV lasix yesterday and this seems to have a good effect. On exam today his lungs are clear, no LE edema. He still reports some shortness of breath. His Cr has been stable, improved down to 1.22. He is medically stable for discharge. Discharge medication regimen includes: -Lasix 40 mg daily -Coreg 6.25 BID -Lisinopril 40 mg daily -Amlodipine 10 mg daily -F/u with PCP in 1-2 weeks  HTN: Patient has been having infrequent vital checks, his most recent BP was elevated at 147/100. Coreg was added yesterday.   -Continue BP meds per above  AKI: Resolved, Cr down to 1.22. He will need to have further diuresis.  -Recommend BMP at hospital-follow up visit.   Back pain: -Reports some upper back pain over the right shoulder, he reports that it is worse with deep breaths. He had a lidocaine patched placed a few nights ago that he states didn't help very much. This may be some muscle tenderness.  -Continue Voltaren gel  Probable COPD:  -No PFTs in chart however patient continues to have wheezing on exam. He is on ipratropium, albuterol and duonebs .  -Continue Duonebs  scheduled -Continue albuterol and ipratropium PRN -Will need PFTs outpatient -Ordered DME nebulizer and ipratropium solution on discharge  Hypokalemia: -Resolved, will need repeat BMP at hospital follow-up visit.   FEN:No fluids, replete lytes prn,regulardiet VTE ppx: Lovenox  Code Status: FULL   Dispo: Anticipated discharge is today.   Claudean Severance, MD 03/09/2019, 6:55 AM Pager: [phone]

## 2019-03-15 ENCOUNTER — Other Ambulatory Visit: Payer: Self-pay

## 2019-03-15 ENCOUNTER — Emergency Department (HOSPITAL_COMMUNITY): Payer: Medicaid Other

## 2019-03-15 ENCOUNTER — Emergency Department (HOSPITAL_COMMUNITY)
Admission: EM | Admit: 2019-03-15 | Discharge: 2019-03-15 | Disposition: A | Discharge status: Home / Self Care | Payer: Medicaid Other | Attending: Emergency Medicine | Admitting: Emergency Medicine

## 2019-03-15 ENCOUNTER — Encounter (HOSPITAL_COMMUNITY): Payer: Self-pay | Admitting: Emergency Medicine

## 2019-03-15 DIAGNOSIS — I110 Hypertensive heart disease with heart failure: Secondary | ICD-10-CM | POA: Diagnosis not present

## 2019-03-15 DIAGNOSIS — F17210 Nicotine dependence, cigarettes, uncomplicated: Secondary | ICD-10-CM | POA: Diagnosis not present

## 2019-03-15 DIAGNOSIS — I509 Heart failure, unspecified: Secondary | ICD-10-CM

## 2019-03-15 DIAGNOSIS — Z79899 Other long term (current) drug therapy: Secondary | ICD-10-CM | POA: Diagnosis not present

## 2019-03-15 DIAGNOSIS — R600 Localized edema: Secondary | ICD-10-CM

## 2019-03-15 DIAGNOSIS — R0602 Shortness of breath: Secondary | ICD-10-CM | POA: Diagnosis present

## 2019-03-15 DIAGNOSIS — R2243 Localized swelling, mass and lump, lower limb, bilateral: Secondary | ICD-10-CM | POA: Diagnosis not present

## 2019-03-15 DIAGNOSIS — J441 Chronic obstructive pulmonary disease with (acute) exacerbation: Secondary | ICD-10-CM | POA: Diagnosis not present

## 2019-03-15 LAB — COMPREHENSIVE METABOLIC PANEL
ALT: 21 U/L (ref 0–44)
AST: 25 U/L (ref 15–41)
Albumin: 3.1 g/dL — ABNORMAL LOW (ref 3.5–5.0)
Alkaline Phosphatase: 83 U/L (ref 38–126)
Anion gap: 12 (ref 5–15)
BUN: 14 mg/dL (ref 6–20)
CO2: 21 mmol/L — ABNORMAL LOW (ref 22–32)
Calcium: 8.9 mg/dL (ref 8.9–10.3)
Chloride: 107 mmol/L (ref 98–111)
Creatinine, Ser: 1.26 mg/dL — ABNORMAL HIGH (ref 0.61–1.24)
GFR calc Af Amer: >60 mL/min (ref >60)
GFR calc non Af Amer: >60 mL/min (ref >60)
Glucose, Bld: 74 mg/dL (ref 70–99)
Potassium: 3.8 mmol/L (ref 3.5–5.1)
Sodium: 140 mmol/L (ref 135–145)
Total Bilirubin: 0.9 mg/dL (ref 0.3–1.2)
Total Protein: 6.5 g/dL (ref 6.5–8.1)

## 2019-03-15 LAB — CBC WITH DIFFERENTIAL/PLATELET
Abs Immature Granulocytes: 0.01 10*3/uL (ref 0.00–0.07)
Basophils Absolute: 0 10*3/uL (ref 0.0–0.1)
Basophils Relative: 1 %
Eosinophils Absolute: 0 10*3/uL (ref 0.0–0.5)
Eosinophils Relative: 0 %
HCT: 39.7 % (ref 39.0–52.0)
Hemoglobin: 13.1 g/dL (ref 13.0–17.0)
Immature Granulocytes: 0 %
Lymphocytes Relative: 41 %
Lymphs Abs: 2.1 10*3/uL (ref 0.7–4.0)
MCH: 32.5 pg (ref 26.0–34.0)
MCHC: 33 g/dL (ref 30.0–36.0)
MCV: 98.5 fL (ref 80.0–100.0)
Monocytes Absolute: 0.6 10*3/uL (ref 0.1–1.0)
Monocytes Relative: 11 %
Neutro Abs: 2.4 10*3/uL (ref 1.7–7.7)
Neutrophils Relative %: 47 %
Platelets: 204 10*3/uL (ref 150–400)
RBC: 4.03 MIL/uL — ABNORMAL LOW (ref 4.22–5.81)
RDW: 16.2 % — ABNORMAL HIGH (ref 11.5–15.5)
WBC: 5.1 10*3/uL (ref 4.0–10.5)
nRBC: 0 % (ref 0.0–0.2)

## 2019-03-15 LAB — BRAIN NATRIURETIC PEPTIDE: B Natriuretic Peptide: 1569.2 pg/mL — ABNORMAL HIGH (ref 0.0–100.0)

## 2019-03-15 LAB — TROPONIN I: Troponin I: <0.03 ng/mL (ref <0.03)

## 2019-03-15 IMAGING — CR CHEST - 2 VIEW
2 series · 2 of 2 positions shown · non-contrast
Comparison: [DATE]

CLINICAL DATA: Shortness of breath

EXAM:
CHEST - 2 VIEW

[chest lat]
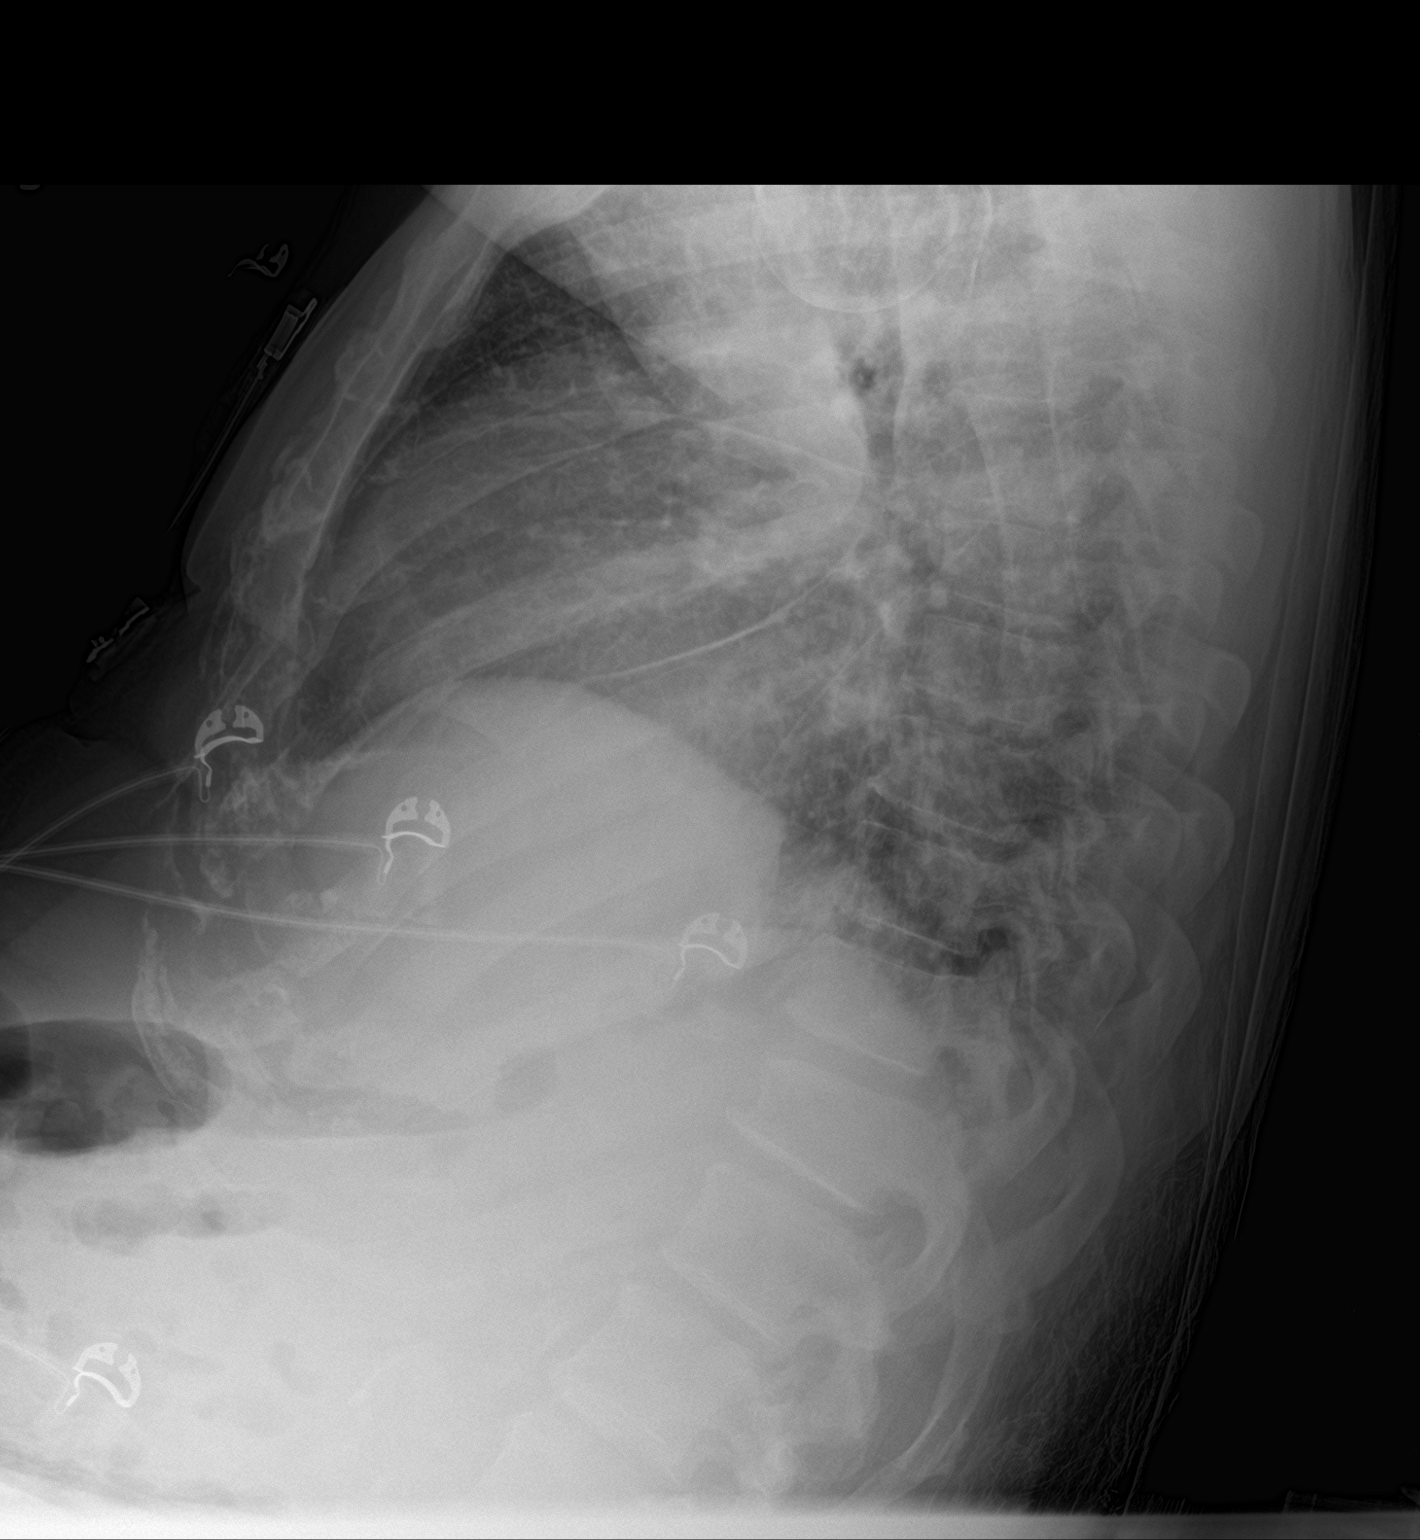

[chest ap]
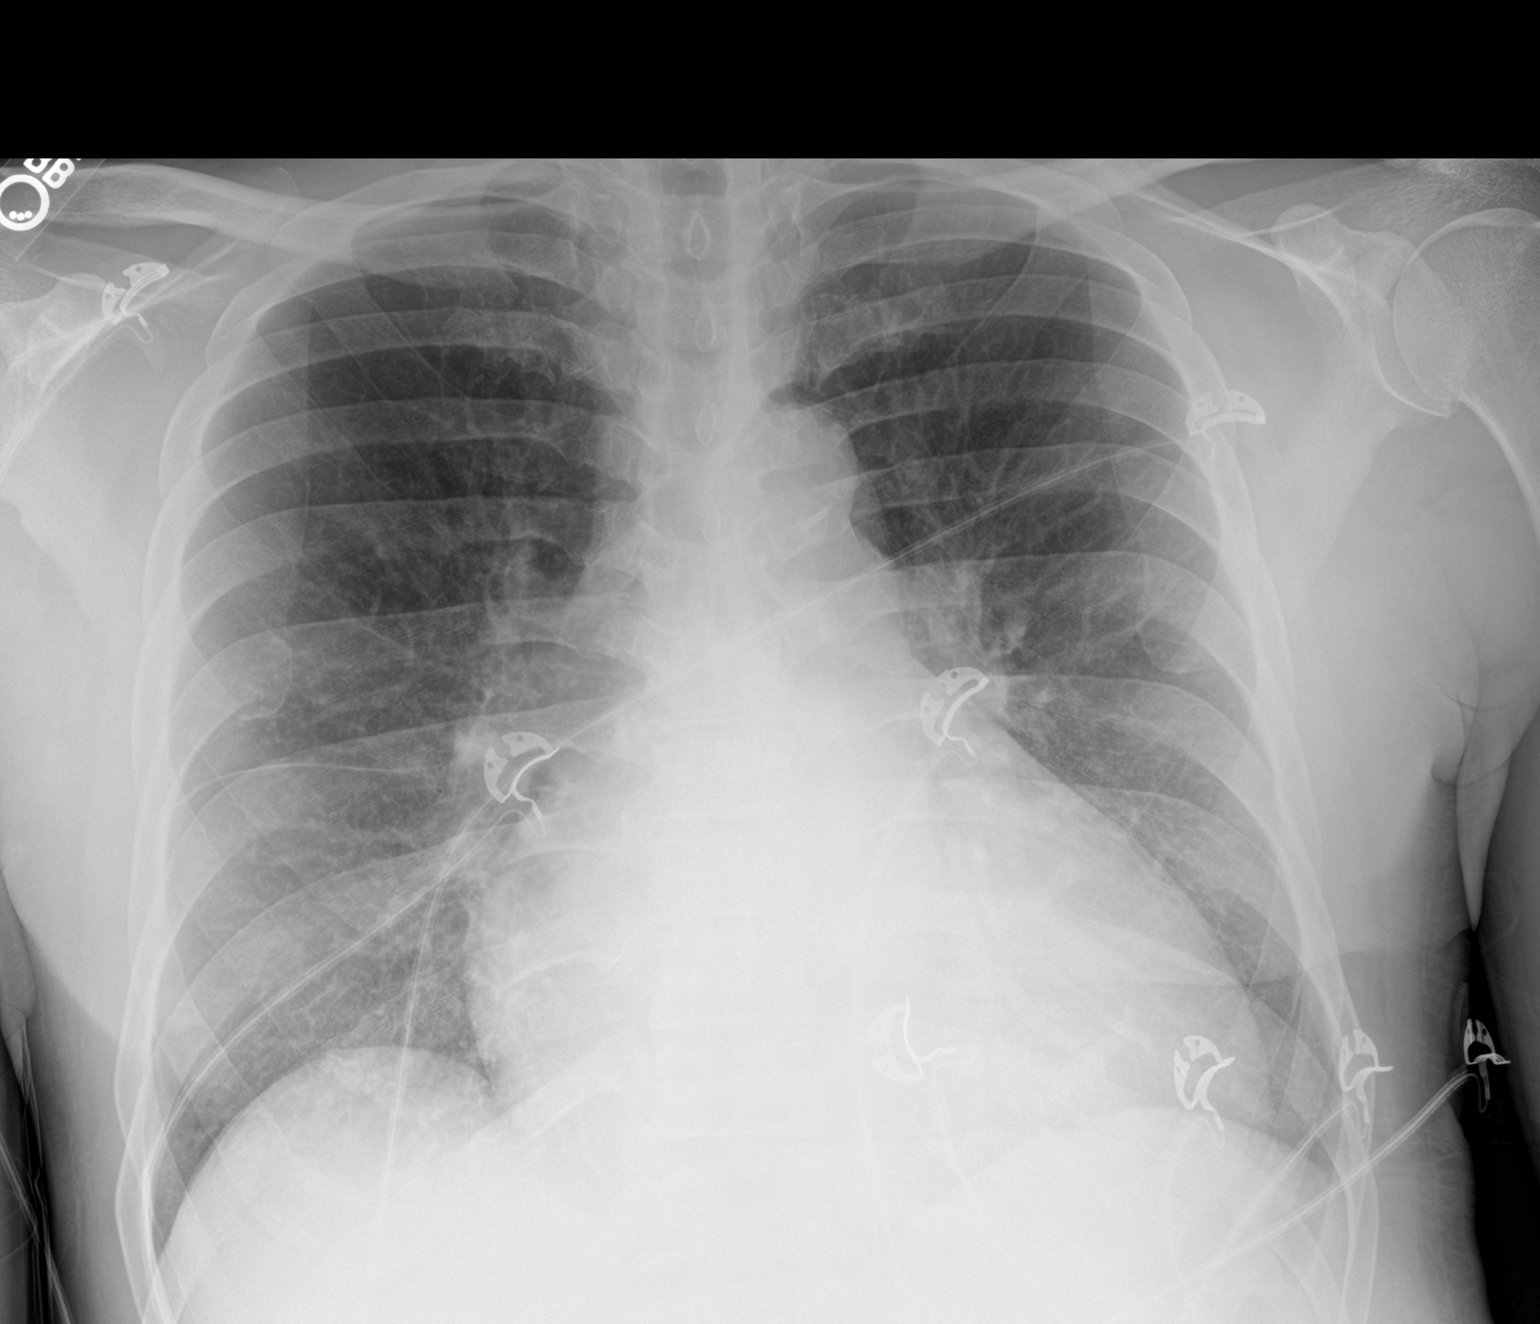

[2 of 2 positions shown; findings below may reference images not displayed]

FINDINGS: There is no edema or consolidation. There is cardiomegaly with
pulmonary vascularity within normal limits. No adenopathy. No bone
lesions.
IMPRESSION: Cardiomegaly.  No edema or consolidation.

## 2019-03-15 MED ORDER — ALBUTEROL SULFATE HFA 108 (90 BASE) MCG/ACT IN AERS
4.0000 | INHALATION_SPRAY | Freq: Once | RESPIRATORY_TRACT | Status: AC
  Administered 2019-03-15: 4 via RESPIRATORY_TRACT
  Filled 2019-03-15: qty 6.7

## 2019-03-15 MED ORDER — CARVEDILOL 6.25 MG PO TABS: 6.2500 mg | ORAL_TABLET | Freq: Two times a day (BID) | ORAL | 2 refills | Status: DC

## 2019-03-15 MED ORDER — FUROSEMIDE 20 MG PO TABS: 20.0000 mg | ORAL_TABLET | Freq: Every day | ORAL | 0 refills | Status: DC

## 2019-03-15 MED ORDER — FUROSEMIDE 10 MG/ML IJ SOLN
40.0000 mg | Freq: Once | INTRAMUSCULAR | Status: AC
  Administered 2019-03-15: 40 mg via INTRAVENOUS
  Filled 2019-03-15: qty 4

## 2019-03-15 NOTE — Discharge Instructions (Addendum)
Take your lisinopril, lasix and carvedilol as prescribed. Schedule a recheck with your doctor as soon as possible within the next 3-7 days.

## 2019-03-15 NOTE — ED Notes (Signed)
Pt given sandwich and drink. Okayed by RN, Clydene Pugh.

## 2019-03-15 NOTE — ED Provider Notes (Signed)
[facility] EMERGENCY DEPARTMENT Provider Note   CSN: [id number] Arrival date & time: 03/15/19  1455    History   Chief Complaint Chief Complaint  Patient presents with  . Shortness of Breath    [name] is a 53 y.o. male.     HPI Patient reports that he is getting increasingly short of breath again.  He reports that he has had a cough with some clear sputum production.  Reports his legs are starting to swell a lot again.  He reports his legs just hurt from the lower legs down to the feet due to swelling.  Patient was hospitalized for a CHF exacerbation discharged 6 days ago.  Patient reports "he wishes he had stayed in the hospital and done what he was supposed to and taking his medications.  He reports he just got frustrated and wanted to leave."  Patient reports that he has continued to take his lisinopril at home.  He denies taking any other blood pressure medications.  He reports he has been taking an albuterol inhaler that he has at home as well.  He does continue to smoke.  He denies fever or chest pain. Past Medical History:  Diagnosis Date  . Asthma   . Bipolar 1 disorder (HCC)   . Depression   . Homelessness   . Hypertension   . Schizoaffective disorder [facility])     Patient Active Problem List   Diagnosis Date Noted  . COPD exacerbation (HCC)   . Acute exacerbation of CHF (congestive heart failure) (HCC) 03/04/2019  . Asthma 12/26/2016  . Tobacco use disorder 12/26/2016  . Schizophrenia (HCC) 12/26/2016  . Cocaine abuse with cocaine-induced mood disorder (HCC) 09/06/2016  . Alcohol use disorder, mild, abuse 07/31/2016  . Substance induced mood disorder (HCC) 07/30/2016  . Schizoaffective disorder, bipolar type (HCC) 05/31/2016  . Cocaine use disorder, severe, dependence (HCC) 05/31/2016  . Essential hypertension 05/31/2016    History reviewed. No pertinent surgical history.      Home Medications    Prior to Admission medications    Medication Sig Start Date End Date Taking? Authorizing Provider  albuterol (PROVENTIL HFA;VENTOLIN HFA) 108 (90 Base) MCG/ACT inhaler Inhale 1-2 puffs into the lungs every 4 (four) hours as needed for shortness of breath. 08/22/18  Yes Dione Booze, MD  ADVAIR DISKUS 250-50 MCG/DOSE AEPB Inhale 1 puff into the lungs 2 (two) times daily. 07/13/18   [provider]  amLODipine (NORVASC) 10 MG tablet Take 1 tablet (10 mg total) by mouth daily. Patient not taking: Reported on 03/04/2019 12/30/16   Tomasita Crumble, MD  carvedilol (COREG) 6.25 MG tablet Take 1 tablet (6.25 mg total) by mouth 2 (two) times daily with a meal. 03/09/19   Claudean Severance, MD  carvedilol (COREG) 6.25 MG tablet Take 1 tablet (6.25 mg total) by mouth 2 (two) times daily with a meal. 03/15/19   Arby Barrette, MD  cyclobenzaprine (FLEXERIL) 10 MG tablet Take 1 tablet (10 mg total) by mouth 3 (three) times daily as needed for muscle spasms. Patient not taking: Reported on 03/04/2019 08/22/18   Dione Booze, MD  furosemide (LASIX) 20 MG tablet Take 2 tablets (40 mg total) by mouth daily. 03/09/19   Claudean Severance, MD  furosemide (LASIX) 20 MG tablet Take 1 tablet (20 mg total) by mouth daily. 03/15/19   Arby Barrette, MD  ipratropium (ATROVENT) 0.02 % nebulizer solution Take 2.5 mLs (0.5 mg total) by nebulization every 6 (six) hours  as needed for wheezing or shortness of breath. 03/09/19   Claudean SeveranceKrienke, Marissa M, MD  lisinopril (PRINIVIL,ZESTRIL) 40 MG tablet Take 1 tablet (40 mg total) by mouth daily. 03/09/19   Claudean SeveranceKrienke, Marissa M, MD  omeprazole (PRILOSEC) 20 MG capsule Take 20 mg by mouth 2 (two) times daily. 07/13/18   [provider]    Family History Family History  Problem Relation Age of Onset  . Drug abuse Mother   . Suicidality Mother     Social History Social History   Tobacco Use  . Smoking status: Current Every Day Smoker    Types: Cigarettes  . Smokeless tobacco: Never Used  Substance Use Topics  .  Alcohol use: Yes    Comment: infrequently  . Drug use: Yes    Types: Cocaine     Allergies   Acetaminophen; Peanut butter flavor; and Latex   Review of Systems Review of Systems 10 Systems reviewed and are negative for acute change except as noted in the HPI.   Physical Exam Updated Vital Signs BP (!) 139/100   Resp 20   Physical Exam Constitutional:      Comments: Alert and nontoxic.  Mild tachypnea.  Speaking in full sentences without difficulty.  HENT:     Head: Normocephalic and atraumatic.     Mouth/Throat:     Mouth: Mucous membranes are moist.     Pharynx: Oropharynx is clear.  Eyes:     Extraocular Movements: Extraocular movements intact.  Cardiovascular:     Comments: Regular.  No gross rub murmur gallop. Pulmonary:     Comments: Mild tachypnea.  Lungs with some forced upper airway expiratory wheeze.  Good airflow to the bases.  No gross crackle. Abdominal:     General: There is no distension.     Palpations: Abdomen is soft.     Tenderness: There is no abdominal tenderness. There is no guarding.     Comments: No edema of the lower abdominal wall.  Musculoskeletal:     Comments: No edema of the upper legs or up to the knees.  2+ pitting edema of the lower third of the legs and the feet.  They are warm and dry.  Skin:    General: Skin is warm and dry.  Neurological:     General: No focal deficit present.     Mental Status: He is oriented to person, place, and time.     Motor: No weakness.     Coordination: Coordination normal.  Psychiatric:     Comments: His mood is okay.  He does tend to escalate and get agitated fairly easily but is at this time being cooperative.      ED Treatments / Results  Labs (all labs ordered are listed, but only abnormal results are displayed) Labs Reviewed  COMPREHENSIVE METABOLIC PANEL - Abnormal; Notable for the following components:      Result Value   CO2 21 (*)    Creatinine, Ser 1.26 (*)    Albumin 3.1 (*)     All other components within normal limits  BRAIN NATRIURETIC PEPTIDE - Abnormal; Notable for the following components:   B Natriuretic Peptide 1,569.2 (*)    All other components within normal limits  CBC WITH DIFFERENTIAL/PLATELET - Abnormal; Notable for the following components:   RBC 4.03 (*)    RDW 16.2 (*)    All other components within normal limits  TROPONIN I    EKG EKG Interpretation  Date/Time:  Tuesday March 15 2019  15:01:50 EDT Ventricular Rate:  96 PR Interval:    QRS Duration: 91 QT Interval:  428 QTC Calculation: 541 R Axis:   49 Text Interpretation:  Sinus rhythm Biatrial enlargement Left ventricular hypertrophy Nonspecific T abnormalities, lateral leads Prolonged QT interval no change from previous. Confirmed by Arby Barrette [phone]) on 03/15/2019 3:23:47 PM   Radiology Dg Chest 2 View  Result Date: 03/15/2019 CLINICAL DATA:  Shortness of breath EXAM: CHEST - 2 VIEW COMPARISON:  March 04 2019 FINDINGS: There is no edema or consolidation. There is cardiomegaly with pulmonary vascularity within normal limits. No adenopathy. No bone lesions. IMPRESSION: Cardiomegaly.  No edema or consolidation. Electronically Signed   By: Bretta Bang III M.D.   On: 03/15/2019 15:59    Procedures Procedures (including critical care time)  Medications Ordered in ED Medications  furosemide (LASIX) injection 40 mg (40 mg Intravenous Given 03/15/19 1726)  albuterol (PROVENTIL HFA;VENTOLIN HFA) 108 (90 Base) MCG/ACT inhaler 4 puff (4 puffs Inhalation Given 03/15/19 1726)     Initial Impression / Assessment and Plan / ED Course  I have reviewed the triage vital signs and the nursing notes.  Pertinent labs & imaging results that were available during my care of the patient were reviewed by me and considered in my medical decision making (see chart for details).       Patient is clinically stable.  His chest x-ray does not show gross vascular congestion and lung sounds clear.   He does have peripheral edema.  Unfortunately has compliance issues with outpatient management.  He apparently left the hospital without getting his prescriptions after his last hospitalization.  It sounds as though he has continued his lisinopril but not been taking Lasix and carvedilol as prescribed at discharge.  After treatment and diagnostic evaluation emergency department, patient does appear stable to continue outpatient management.  He is counseled on which medications to take in to start the carvedilol and Lasix as well as continuing his lisinopril.  He is counseled on close follow-up.  Return precautions reviewed.  Final Clinical Impressions(s) / ED Diagnoses   Final diagnoses:  Acute on chronic congestive heart failure, unspecified heart failure type (HCC)  Bilateral lower extremity edema    ED Discharge Orders         Ordered    carvedilol (COREG) 6.25 MG tablet  2 times daily with meals     03/15/19 2036    furosemide (LASIX) 20 MG tablet  Daily     03/15/19 2036           Arby Barrette, MD 03/16/19 2348

## 2019-03-15 NOTE — ED Notes (Signed)
Patient transported to X-ray 

## 2019-03-15 NOTE — ED Notes (Signed)
Pt refused vitals for EMS

## 2019-03-15 NOTE — ED Notes (Signed)
Pt refused to have his temperature taken. When asked why, he said that he was going to talk to the doctor first and told this tech that he did not want me working on him and that I can leave.

## 2019-03-15 NOTE — ED Triage Notes (Signed)
BIB EMS for SOB and cough for 2 days, states legs are swollen. Pt very agitated, would not let EMS do anything or take vitals. No sick contacts or travel

## 2019-03-15 NOTE — ED Notes (Signed)
RN to draw bloodwork with IV start  

## 2019-03-27 ENCOUNTER — Encounter (HOSPITAL_COMMUNITY): Payer: Self-pay | Admitting: Emergency Medicine

## 2019-03-27 ENCOUNTER — Inpatient Hospital Stay (HOSPITAL_COMMUNITY)
Admission: EM | Admit: 2019-03-27 | Discharge: 2019-03-29 | DRG: 292 | Disposition: A | Discharge status: Home / Self Care | Payer: Medicaid Other | Attending: Internal Medicine | Admitting: Internal Medicine

## 2019-03-27 ENCOUNTER — Emergency Department (HOSPITAL_COMMUNITY): Payer: Medicaid Other

## 2019-03-27 ENCOUNTER — Other Ambulatory Visit: Payer: Self-pay

## 2019-03-27 DIAGNOSIS — Z818 Family history of other mental and behavioral disorders: Secondary | ICD-10-CM | POA: Diagnosis not present

## 2019-03-27 DIAGNOSIS — I509 Heart failure, unspecified: Secondary | ICD-10-CM

## 2019-03-27 DIAGNOSIS — Z91040 Latex allergy status: Secondary | ICD-10-CM

## 2019-03-27 DIAGNOSIS — Z7952 Long term (current) use of systemic steroids: Secondary | ICD-10-CM | POA: Diagnosis not present

## 2019-03-27 DIAGNOSIS — R7989 Other specified abnormal findings of blood chemistry: Secondary | ICD-10-CM

## 2019-03-27 DIAGNOSIS — I361 Nonrheumatic tricuspid (valve) insufficiency: Secondary | ICD-10-CM | POA: Diagnosis not present

## 2019-03-27 DIAGNOSIS — I5023 Acute on chronic systolic (congestive) heart failure: Secondary | ICD-10-CM | POA: Diagnosis present

## 2019-03-27 DIAGNOSIS — E669 Obesity, unspecified: Secondary | ICD-10-CM | POA: Diagnosis present

## 2019-03-27 DIAGNOSIS — F329 Major depressive disorder, single episode, unspecified: Secondary | ICD-10-CM | POA: Diagnosis present

## 2019-03-27 DIAGNOSIS — F250 Schizoaffective disorder, bipolar type: Secondary | ICD-10-CM | POA: Diagnosis present

## 2019-03-27 DIAGNOSIS — Z91018 Allergy to other foods: Secondary | ICD-10-CM | POA: Diagnosis not present

## 2019-03-27 DIAGNOSIS — Z59819 Housing instability, housed unspecified: Secondary | ICD-10-CM

## 2019-03-27 DIAGNOSIS — Z9111 Patient's noncompliance with dietary regimen: Secondary | ICD-10-CM

## 2019-03-27 DIAGNOSIS — J449 Chronic obstructive pulmonary disease, unspecified: Secondary | ICD-10-CM

## 2019-03-27 DIAGNOSIS — R600 Localized edema: Secondary | ICD-10-CM | POA: Diagnosis present

## 2019-03-27 DIAGNOSIS — I110 Hypertensive heart disease with heart failure: Principal | ICD-10-CM

## 2019-03-27 DIAGNOSIS — Z9114 Patient's other noncompliance with medication regimen: Secondary | ICD-10-CM | POA: Diagnosis not present

## 2019-03-27 DIAGNOSIS — Z599 Problem related to housing and economic circumstances, unspecified: Secondary | ICD-10-CM

## 2019-03-27 DIAGNOSIS — Z7951 Long term (current) use of inhaled steroids: Secondary | ICD-10-CM

## 2019-03-27 DIAGNOSIS — Z79899 Other long term (current) drug therapy: Secondary | ICD-10-CM | POA: Diagnosis not present

## 2019-03-27 DIAGNOSIS — N179 Acute kidney failure, unspecified: Secondary | ICD-10-CM | POA: Diagnosis present

## 2019-03-27 DIAGNOSIS — F17200 Nicotine dependence, unspecified, uncomplicated: Secondary | ICD-10-CM | POA: Diagnosis present

## 2019-03-27 DIAGNOSIS — Z888 Allergy status to other drugs, medicaments and biological substances status: Secondary | ICD-10-CM | POA: Diagnosis not present

## 2019-03-27 DIAGNOSIS — Z6826 Body mass index (BMI) 26.0-26.9, adult: Secondary | ICD-10-CM

## 2019-03-27 DIAGNOSIS — I10 Essential (primary) hypertension: Secondary | ICD-10-CM | POA: Diagnosis present

## 2019-03-27 DIAGNOSIS — R000 Tachycardia, unspecified: Secondary | ICD-10-CM | POA: Diagnosis present

## 2019-03-27 DIAGNOSIS — I248 Other forms of acute ischemic heart disease: Secondary | ICD-10-CM | POA: Diagnosis present

## 2019-03-27 DIAGNOSIS — F1420 Cocaine dependence, uncomplicated: Secondary | ICD-10-CM | POA: Diagnosis present

## 2019-03-27 DIAGNOSIS — F17210 Nicotine dependence, cigarettes, uncomplicated: Secondary | ICD-10-CM

## 2019-03-27 DIAGNOSIS — F319 Bipolar disorder, unspecified: Secondary | ICD-10-CM

## 2019-03-27 DIAGNOSIS — F1490 Cocaine use, unspecified, uncomplicated: Secondary | ICD-10-CM

## 2019-03-27 LAB — RAPID URINE DRUG SCREEN, HOSP PERFORMED
Amphetamines: NOT DETECTED
Barbiturates: NOT DETECTED
Benzodiazepines: NOT DETECTED
Cocaine: POSITIVE — AB
Opiates: NOT DETECTED
Tetrahydrocannabinol: NOT DETECTED

## 2019-03-27 LAB — COMPREHENSIVE METABOLIC PANEL
ALT: 40 U/L (ref 0–44)
AST: 39 U/L (ref 15–41)
Albumin: 2.9 g/dL — ABNORMAL LOW (ref 3.5–5.0)
Alkaline Phosphatase: 80 U/L (ref 38–126)
Anion gap: 8 (ref 5–15)
BUN: 18 mg/dL (ref 6–20)
CO2: 23 mmol/L (ref 22–32)
Calcium: 8.6 mg/dL — ABNORMAL LOW (ref 8.9–10.3)
Chloride: 109 mmol/L (ref 98–111)
Creatinine, Ser: 1.61 mg/dL — ABNORMAL HIGH (ref 0.61–1.24)
GFR calc Af Amer: 57 mL/min — ABNORMAL LOW (ref >60)
GFR calc non Af Amer: 49 mL/min — ABNORMAL LOW (ref >60)
Glucose, Bld: 84 mg/dL (ref 70–99)
Potassium: 4.5 mmol/L (ref 3.5–5.1)
Sodium: 140 mmol/L (ref 135–145)
Total Bilirubin: 1.4 mg/dL — ABNORMAL HIGH (ref 0.3–1.2)
Total Protein: 5.6 g/dL — ABNORMAL LOW (ref 6.5–8.1)

## 2019-03-27 LAB — TROPONIN I: Troponin I: 0.04 ng/mL (ref <0.03)

## 2019-03-27 LAB — CBC WITH DIFFERENTIAL/PLATELET
Abs Immature Granulocytes: 0.03 10*3/uL (ref 0.00–0.07)
Basophils Absolute: 0 10*3/uL (ref 0.0–0.1)
Basophils Relative: 0 %
Eosinophils Absolute: 0.1 10*3/uL (ref 0.0–0.5)
Eosinophils Relative: 1 %
HCT: 41.3 % (ref 39.0–52.0)
Hemoglobin: 13.3 g/dL (ref 13.0–17.0)
Immature Granulocytes: 0 %
Lymphocytes Relative: 30 %
Lymphs Abs: 2.4 10*3/uL (ref 0.7–4.0)
MCH: 32.3 pg (ref 26.0–34.0)
MCHC: 32.2 g/dL (ref 30.0–36.0)
MCV: 100.2 fL — ABNORMAL HIGH (ref 80.0–100.0)
Monocytes Absolute: 0.9 10*3/uL (ref 0.1–1.0)
Monocytes Relative: 11 %
Neutro Abs: 4.8 10*3/uL (ref 1.7–7.7)
Neutrophils Relative %: 58 %
Platelets: 258 10*3/uL (ref 150–400)
RBC: 4.12 MIL/uL — ABNORMAL LOW (ref 4.22–5.81)
RDW: 17.8 % — ABNORMAL HIGH (ref 11.5–15.5)
WBC: 8.2 10*3/uL (ref 4.0–10.5)
nRBC: 0 % (ref 0.0–0.2)

## 2019-03-27 LAB — BRAIN NATRIURETIC PEPTIDE: B Natriuretic Peptide: 1185.7 pg/mL — ABNORMAL HIGH (ref 0.0–100.0)

## 2019-03-27 IMAGING — CR CHEST - 2 VIEW
2 series · 2 of 2 positions shown · non-contrast
Comparison: [DATE] and earlier.

CLINICAL DATA: 52-year-old male with shortness of breath and lower
extremity swelling not improved with increased doses of Lasix
recently.

EXAM:
CHEST - 2 VIEW

[chest lat]
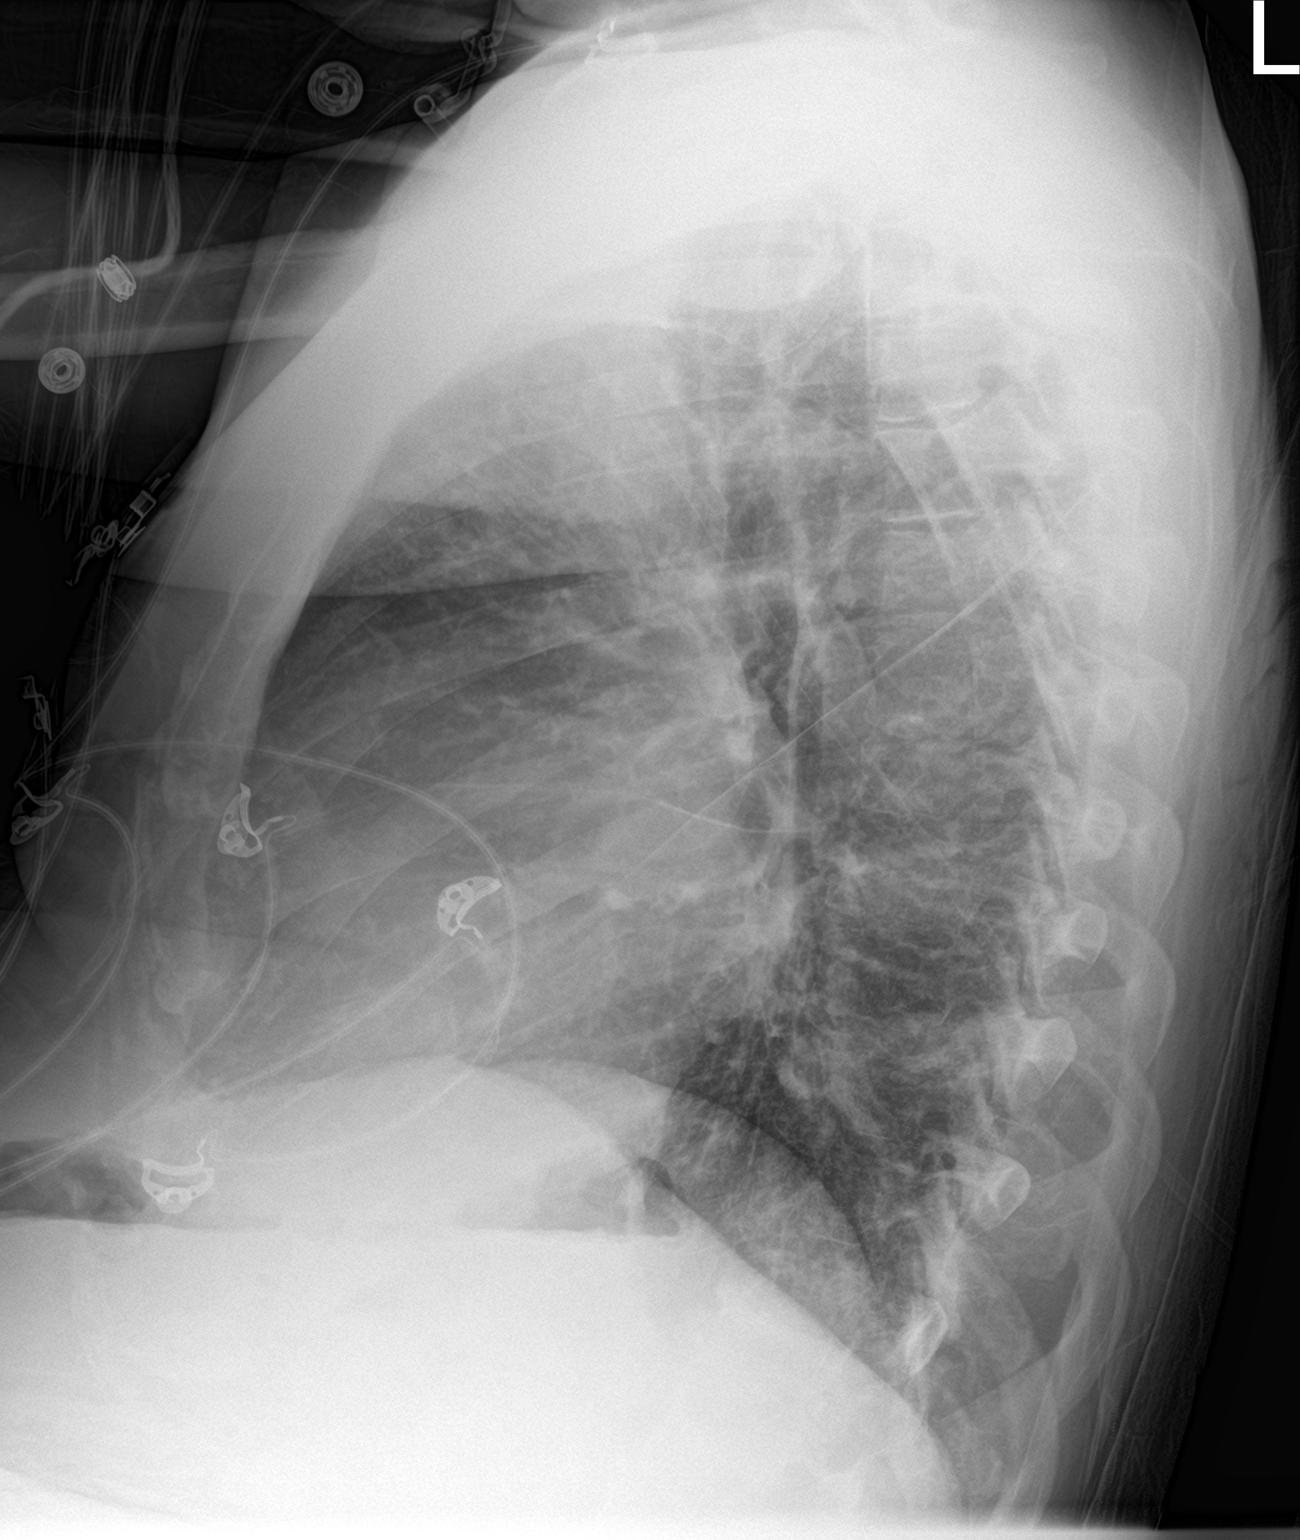

[chest ap]
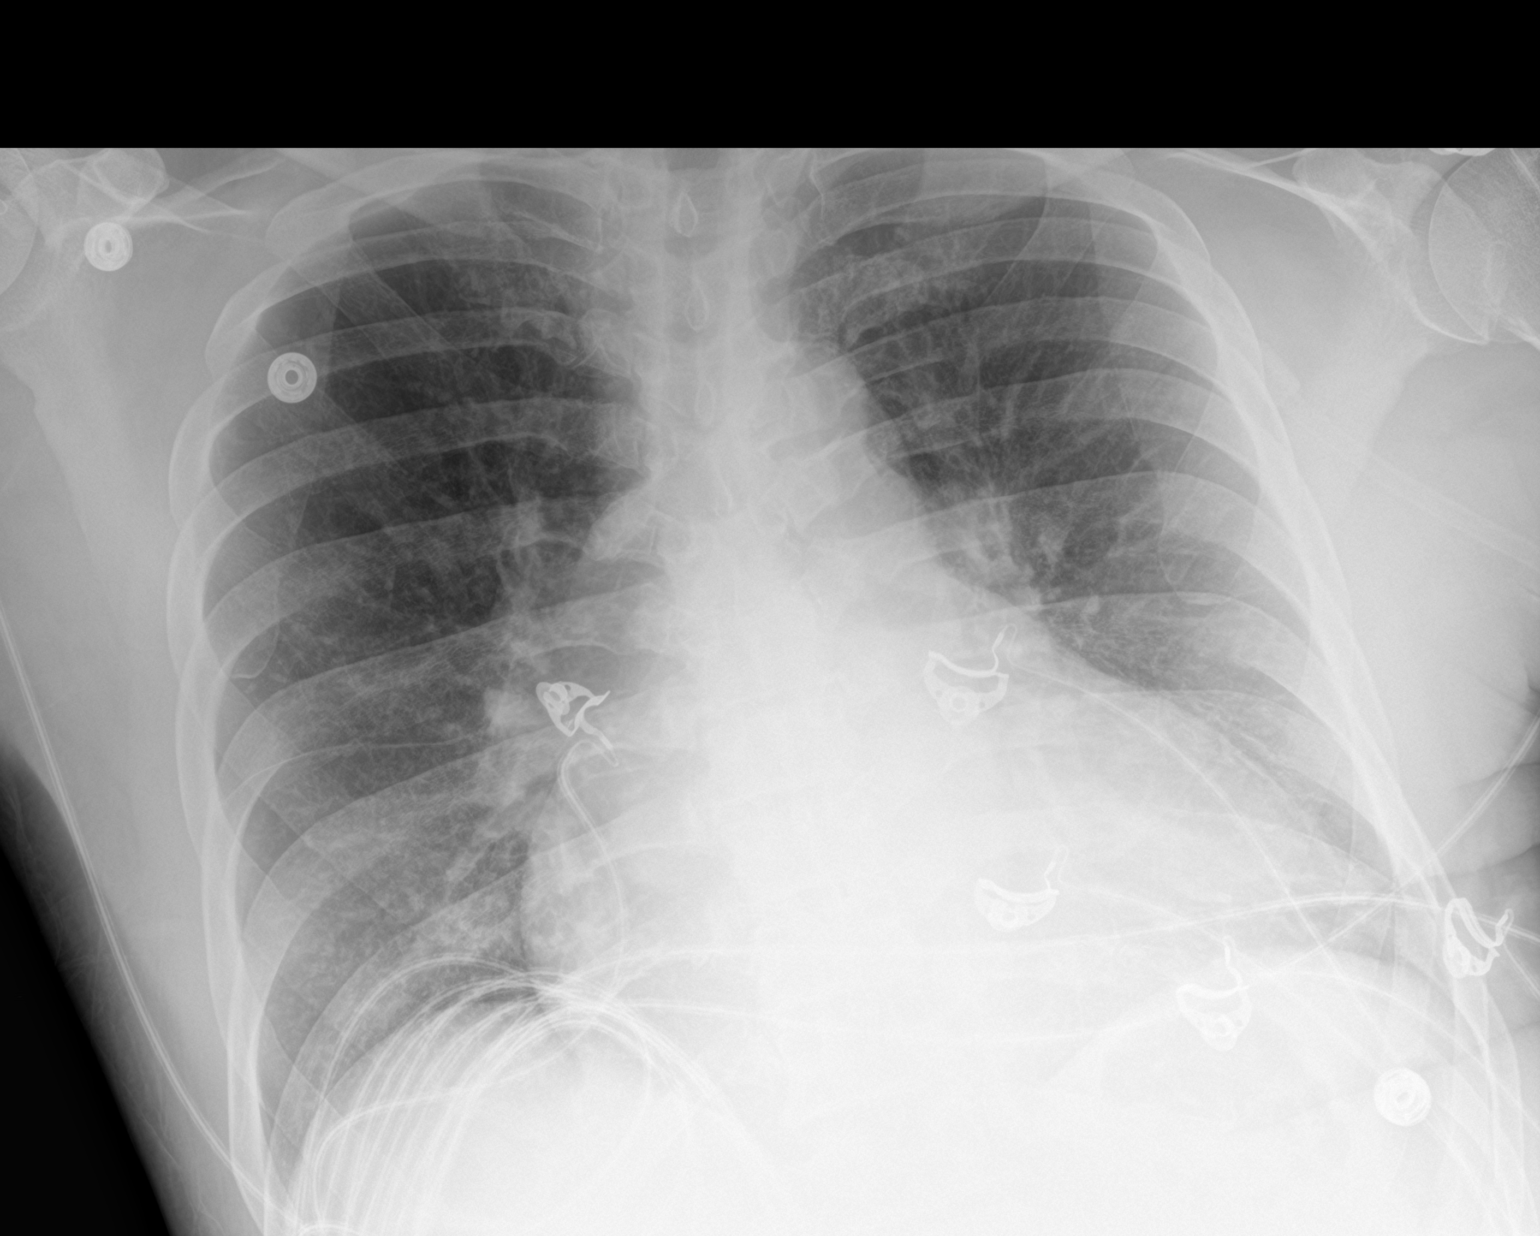

[2 of 2 positions shown; findings below may reference images not displayed]

FINDINGS: Semi upright AP and lateral views of the chest. Stable cardiomegaly
and mediastinal contours. Stable lung volumes. Visualized tracheal
air column is within normal limits. No pneumothorax, pleural
effusion or consolidation. Bilateral pulmonary vascularity has not
significantly changed. Mild vascular congestion but no overt edema.

Negative visible bowel gas pattern. No acute osseous abnormality
identified.
IMPRESSION: Stable cardiomegaly and mild pulmonary vascular congestion without
overt edema.

## 2019-03-27 MED ORDER — CLONIDINE HCL 0.1 MG PO TABS
0.1000 mg | ORAL_TABLET | Freq: Every day | ORAL | Status: DC
  Administered 2019-03-28 – 2019-03-29 (×2): 0.1 mg via ORAL
  Filled 2019-03-27 (×2): qty 1

## 2019-03-27 MED ORDER — MOMETASONE FURO-FORMOTEROL FUM 200-5 MCG/ACT IN AERO
2.0000 | INHALATION_SPRAY | Freq: Two times a day (BID) | RESPIRATORY_TRACT | Status: DC
  Administered 2019-03-28: 2 via RESPIRATORY_TRACT
  Filled 2019-03-27 (×2): qty 8.8

## 2019-03-27 MED ORDER — ENOXAPARIN SODIUM 40 MG/0.4ML ~~LOC~~ SOLN
40.0000 mg | Freq: Every day | SUBCUTANEOUS | Status: DC
  Administered 2019-03-28 (×2): 40 mg via SUBCUTANEOUS
  Filled 2019-03-27 (×2): qty 0.4

## 2019-03-27 MED ORDER — ALBUTEROL SULFATE HFA 108 (90 BASE) MCG/ACT IN AERS
4.0000 | INHALATION_SPRAY | Freq: Once | RESPIRATORY_TRACT | Status: AC
  Administered 2019-03-27: 4 via RESPIRATORY_TRACT
  Filled 2019-03-27: qty 6.7

## 2019-03-27 MED ORDER — FUROSEMIDE 10 MG/ML IJ SOLN
40.0000 mg | Freq: Once | INTRAMUSCULAR | Status: AC
  Administered 2019-03-27: 20:00:00 40 mg via INTRAVENOUS
  Filled 2019-03-27: qty 4

## 2019-03-27 MED ORDER — ASPIRIN 81 MG PO CHEW
324.0000 mg | CHEWABLE_TABLET | Freq: Once | ORAL | Status: AC
  Administered 2019-03-27: 18:00:00 324 mg via ORAL
  Filled 2019-03-27: qty 4

## 2019-03-27 MED ORDER — LISINOPRIL 40 MG PO TABS
40.0000 mg | ORAL_TABLET | Freq: Every day | ORAL | Status: DC
  Administered 2019-03-28 – 2019-03-29 (×2): 40 mg via ORAL
  Filled 2019-03-27 (×2): qty 1

## 2019-03-27 MED ORDER — ALBUTEROL SULFATE (2.5 MG/3ML) 0.083% IN NEBU
2.5000 mg | INHALATION_SOLUTION | RESPIRATORY_TRACT | Status: DC | PRN
  Administered 2019-03-28 – 2019-03-29 (×2): 2.5 mg via RESPIRATORY_TRACT
  Filled 2019-03-27 (×2): qty 3

## 2019-03-27 MED ORDER — FUROSEMIDE 10 MG/ML IJ SOLN
40.0000 mg | Freq: Two times a day (BID) | INTRAMUSCULAR | Status: DC
  Administered 2019-03-28: 10:00:00 40 mg via INTRAVENOUS
  Filled 2019-03-27: qty 4

## 2019-03-27 NOTE — ED Triage Notes (Addendum)
Per EMS- called to home for complaint of shortness of breath with swelling to ankles for several days. Pt has been doubling up on his lasix with no relief. 98% room air with EMS. Pt endorses cough with clear mucous throughout the day. Seen for SOB and swelling earlier in April.

## 2019-03-27 NOTE — ED Notes (Signed)
ED TO INPATIENT HANDOFF REPORT  ED Nurse Name and Phone #: Aundra MilletMegan [phone]  S Name/Age/Gender [name] 53 y.o. male Room/Bed: 039C/039C  Code Status   Code Status: Full Code  Home/SNF/Other Home Patient oriented to: self, place, time and situation Is this baseline? Yes   Triage Complete: Triage complete  Chief Complaint sob, swollen ankles  Triage Note Per EMS- called to home for complaint of shortness of breath with swelling to ankles for several days. Pt has been doubling up on his lasix with no relief. 98% room air with EMS. Pt endorses cough with clear mucous throughout the day. Seen for SOB and swelling earlier in April.    Allergies Allergies  Allergen Reactions  . Acetaminophen Hives, Nausea And Vomiting and Rash    Pt overdosed on bottle of APAP 05/06/16   . Peanut Butter Flavor Itching and Nausea And Vomiting    Patient doesn't recognize, but it's been in Epic since 2019 (leaving it)  . Latex Rash    Level of Care/Admitting Diagnosis ED Disposition    ED Disposition Condition Comment   Admit  [facility][id number][facility]  Level of Care: Telemetry Medical [104]  Covid Evaluation: N/A  Diagnosis: Acute on chronic HFrEF (heart failure with reduced ejection fraction) [facility](HCC) [[id number]]  Admitting Physician: Anne ShutterAINES, ALEXANDER N [[id number]]  Attending Physician: Anne ShutterAINES, ALEXANDER N [[id number]]  Estimated length of stay: 3 - 4 days  Certification:: I certify this patient will need inpatient services for at least 2 midnights  PT Class (Do Not Modify): Inpatient [101]  PT Acc Code (Do Not Modify): Private [1]       B Medical/Surgery History Past Medical History:  Diagnosis Date  . Asthma   . Bipolar 1 disorder (HCC)   . Depression   . Homelessness   . Hypertension   . Schizoaffective disorder (HCC)    History reviewed. No pertinent surgical history.   A IV Location/Drains/Wounds Patient Lines/Drains/Airways Status   Active  Line/Drains/Airways    Name:   Placement date:   Placement time:   Site:   Days:   Peripheral IV 03/27/19 Left Antecubital   03/27/19    -    Antecubital   less than 1          Intake/Output Last 24 hours  Intake/Output Summary (Last 24 hours) at 03/27/2019 2141 Last data filed at 03/27/2019 2141 Gross per 24 hour  Intake 222 ml  Output 1800 ml  Net -1578 ml    Labs/Imaging Results for orders placed or performed during the hospital encounter of 03/27/19 (from the past 48 hour(s))  Comprehensive metabolic panel     Status: Abnormal   Collection Time: 03/27/19  6:00 PM  Result Value Ref Range   Sodium 140 135 - 145 mmol/L   Potassium 4.5 3.5 - 5.1 mmol/L   Chloride 109 98 - 111 mmol/L   CO2 23 22 - 32 mmol/L   Glucose, Bld 84 70 - 99 mg/dL   BUN 18 6 - 20 mg/dL   Creatinine, Ser 7.251.61 (H) 0.61 - 1.24 mg/dL   Calcium 8.6 (L) 8.9 - 10.3 mg/dL   Total Protein 5.6 (L) 6.5 - 8.1 g/dL   Albumin 2.9 (L) 3.5 - 5.0 g/dL   AST 39 15 - 41 U/L   ALT 40 0 - 44 U/L   Alkaline Phosphatase 80 38 - 126 U/L   Total Bilirubin 1.4 (H) 0.3 - 1.2 mg/dL   GFR calc non Af  Amer 49 (L) >60 mL/min   GFR calc Af Amer 57 (L) >60 mL/min   Anion gap 8 5 - 15    Comment: Performed at [facility] Lab, [address] [place], [name] [address]  Troponin I - Once     Status: Abnormal   Collection Time: 03/27/19  6:00 PM  Result Value Ref Range   Troponin I 0.04 (HH) <0.03 ng/mL    Comment: CRITICAL RESULT CALLED TO, READ BACK BY AND VERIFIED WITH: Marcha Solders RN AT 1905 03/27/2019 BY Miquel Dunn Performed at [facility], [address], [place], [name] [address]   CBC with Differential     Status: Abnormal   Collection Time: 03/27/19  6:00 PM  Result Value Ref Range   WBC 8.2 4.0 - 10.5 K/uL   RBC 4.12 (L) 4.22 - 5.81 MIL/uL   Hemoglobin 13.3 13.0 - 17.0 g/dL   HCT 95.2 84.1 - 32.4 %   MCV 100.2 (H) 80.0 - 100.0 fL   MCH 32.3 26.0 - 34.0 pg   MCHC 32.2 30.0 - 36.0 g/dL   RDW 40.1 (H) 02.7  - 15.5 %   Platelets 258 150 - 400 K/uL   nRBC 0.0 0.0 - 0.2 %   Neutrophils Relative % 58 %   Neutro Abs 4.8 1.7 - 7.7 K/uL   Lymphocytes Relative 30 %   Lymphs Abs 2.4 0.7 - 4.0 K/uL   Monocytes Relative 11 %   Monocytes Absolute 0.9 0.1 - 1.0 K/uL   Eosinophils Relative 1 %   Eosinophils Absolute 0.1 0.0 - 0.5 K/uL   Basophils Relative 0 %   Basophils Absolute 0.0 0.0 - 0.1 K/uL   Immature Granulocytes 0 %   Abs Immature Granulocytes 0.03 0.00 - 0.07 K/uL    Comment: Performed at [facility], [address], [place], [name] [address]  Brain natriuretic peptide     Status: Abnormal   Collection Time: 03/27/19  6:00 PM  Result Value Ref Range   B Natriuretic Peptide 1,185.7 (H) 0.0 - 100.0 pg/mL    Comment: Performed at [facility], [address], [place], [name] [address]   Dg Chest 2 View  Result Date: 03/27/2019 CLINICAL DATA:  53 year old male with shortness of breath and lower extremity swelling not improved with increased doses of Lasix recently. EXAM: CHEST - 2 VIEW COMPARISON:  03/15/2019 and earlier. FINDINGS: Semi upright AP and lateral views of the chest. Stable cardiomegaly and mediastinal contours. Stable lung volumes. Visualized tracheal air column is within normal limits. No pneumothorax, pleural effusion or consolidation. Bilateral pulmonary vascularity has not significantly changed. Mild vascular congestion but no overt edema. Negative visible bowel gas pattern. No acute osseous abnormality identified. IMPRESSION: Stable cardiomegaly and mild pulmonary vascular congestion without overt edema. Electronically Signed   By: Odessa Fleming M.D.   On: 03/27/2019 19:09    Pending Labs Unresulted Labs (From admission, onward)    Start     Ordered   03/28/19 0500  Basic metabolic panel  Tomorrow morning,   R     03/27/19 2130   03/27/19 2359  Troponin I - Now Then Q6H  Now then every 6 hours,   R     03/27/19 2059   03/27/19 2049  Rapid urine drug screen (hospital  performed)  ONCE - STAT,   R     03/27/19 2048          Vitals/Pain Today's Vitals  03/27/19 2016 03/27/19 2036 03/27/19 2045 03/27/19 2115  BP: (!) 167/127   (!) 163/103  Pulse: (!) 120 (!) 107 (!) 102 (!) 107  Resp: (!) 26 (!) 26  (!) 23  Temp:      TempSrc:      SpO2: 98% 97% 97% 97%  Weight:      Height:      PainSc:        Isolation Precautions No active isolations  Medications Medications  enoxaparin (LOVENOX) injection 40 mg (has no administration in time range)  lisinopril (ZESTRIL) tablet 40 mg (has no administration in time range)  cloNIDine (CATAPRES) tablet 0.1 mg (has no administration in time range)  albuterol (VENTOLIN HFA) 108 (90 Base) MCG/ACT inhaler 1-2 puff (has no administration in time range)  mometasone-formoterol (DULERA) 200-5 MCG/ACT inhaler 2 puff (has no administration in time range)  furosemide (LASIX) injection 40 mg (has no administration in time range)  aspirin chewable tablet 324 mg (324 mg Oral Given 03/27/19 1823)  albuterol (VENTOLIN HFA) 108 (90 Base) MCG/ACT inhaler 4 puff (4 puffs Inhalation Given 03/27/19 1823)  furosemide (LASIX) injection 40 mg (40 mg Intravenous Given 03/27/19 2004)    Mobility walks with person assist High fall risk   Focused Assessments Pulmonary Assessment Handoff:  Lung sounds:   O2 Device: Nasal Cannula        R Recommendations: See Admitting Provider Note  Report given to:   Additional Notes: Pt alert and oriented. Pt received 40 mg of IV lasix at 2004. Pt 98% on RA, but placed on 3L [place] due to work of breathing and for comfort. Bilateral lower extremity swelling.

## 2019-03-27 NOTE — ED Notes (Signed)
Admitting team at bedside.

## 2019-03-27 NOTE — H&P (Addendum)
Date: 03/27/2019               Patient Name:  [name] MRN: [id number]  DOB: [birth year] Age / Sex: 53 y.o., male   PCP: [name]         Medical Service: Internal Medicine Teaching Service         Attending Physician: Dr. Sandre Kitty, Elwin Mocha, MD    First Contact: Dr. Karilyn Cota, Areeg Pager: [phone]  Second Contact: Dr. Arnetha Courser Pager: [phone]       After Hours (After 5p/  First Contact Pager: [phone]  weekends / holidays): Second Contact Pager: [phone]   Chief Complaint: Shortness of breath  History of Present Illness:  [name] is a 54 yr M w/ PMH of COPD, HFrEF, Bipolar disorder, hx of cocaine use, HTN and tobacco use presenting with shortness of breath and lower extremity swelling. He states that he was in his usual state of health until about a week and a half ago when he began to have worsening exertional dyspnea with increasing lower extremity swelling. He states he initially tried to take two of his furosemide tablets to compensate but his symptoms progressively worsened until he could not tolerate it any more. He also endorsed intermittent diffuse chest pain that lasts for about a second. He also experienced productive cough with white sputum and palpitations. He states he had some relief with his rescue inhalers but progressively his symptoms worsened and came to ED for evaluation as he was also running out of his medications at home. He states he has been practicing low-sodium diet and fluid restriction and has been taking all of his medications as prescribed. He states he did couple lines of cocaine about a week ago.  On chart review, he was admitted on 03/04/19 for heart failure exacerbation and was discharged w/ instructions to take furosemide 40mg  daily, lisinopril 40mg  daily and carvedilol 6.25mg  BID. He was also informed to f/u with a PCP. His dry weight at discharge was 83.2kg.  On review of systems, he denies any fevers, chills, malaise,  nausea, vomiting, dysuria, urgency, hesitancy. He denies any travel or sick contact. States his roommates has not had any viral symptoms.   In the ED he received furosemide 40mg  IV and aspirin 325mg . Chest X-ray showed vascular congestion and he had a mild troponemia at 0.04 as well as BNP of 1200. IMTS was consulted for admission.  Meds: Current Meds  Medication Sig  . ADVAIR DISKUS 250-50 MCG/DOSE AEPB Inhale 1 puff into the lungs 2 (two) times daily.  Marland Kitchen albuterol (PROVENTIL HFA;VENTOLIN HFA) 108 (90 Base) MCG/ACT inhaler Inhale 1-2 puffs into the lungs every 4 (four) hours as needed for shortness of breath.  Marland Kitchen ammonium lactate (LAC-HYDRIN) 12 % lotion Apply 1 application topically 2 (two) times a day. To feet  . cloNIDine (CATAPRES) 0.1 MG tablet Take 0.1 mg by mouth daily.  . cyclobenzaprine (FLEXERIL) 10 MG tablet Take 1 tablet (10 mg total) by mouth 3 (three) times daily as needed for muscle spasms.  . furosemide (LASIX) 20 MG tablet Take 2 tablets (40 mg total) by mouth daily.  Marland Kitchen ipratropium (ATROVENT) 0.02 % nebulizer solution Take 2.5 mLs (0.5 mg total) by nebulization every 6 (six) hours as needed for wheezing or shortness of breath.  . lisinopril (PRINIVIL,ZESTRIL) 40 MG tablet Take 1 tablet (40 mg total) by mouth daily. (Patient taking differently: Take 20 mg by mouth daily. )  . penicillin v potassium (  VEETID) 500 MG tablet Take 500 mg by mouth daily.  . predniSONE (DELTASONE) 20 MG tablet Take 20 mg by mouth daily with breakfast.   Allergies: Allergies as of 03/27/2019 - Review Complete 03/27/2019  Allergen Reaction Noted  . Acetaminophen Hives, Nausea And Vomiting, and Rash 08/10/2015  . Peanut butter flavor Itching and Nausea And Vomiting 01/07/2018  . Latex Rash 03/04/2019   Past Medical History:  Diagnosis Date  . Asthma   . Bipolar 1 disorder (HCC)   . Depression   . Homelessness   . Hypertension   . Schizoaffective disorder (HCC)    Family History: States he  does not know of any significant cardiac hx in family members.  Family History  Problem Relation Age of Onset  . Drug abuse Mother   . Suicidality Mother    Social History:  Unstable housing, currently living in [place] at a friend's hosue. Continues to smoke 1 pack a day. Denies any alcohol use. Did couple lines of cocaine about a week ago.   Social History   Tobacco Use  . Smoking status: Current Every Day Smoker    Types: Cigarettes  . Smokeless tobacco: Never Used  Substance Use Topics  . Alcohol use: Yes    Comment: infrequently  . Drug use: Yes    Types: Cocaine   Review of Systems: A complete ROS was negative except as per HPI.   Physical Exam: Blood pressure (!) 163/103, pulse (!) 107, temperature 98.7 F (37.1 C), temperature source Oral, resp. rate (!) 23, height 5\' 9"  (1.753 m), weight 81.6 kg, SpO2 97 %. Gen: Well-developed, chronically ill-appearing, NAD HEENT: EOMI, MMM, poor dentition Neck: supple, ROM intact, + JVD CV: RRR, S1, S2 normal, No rubs, no murmurs, no gallops Pulm: Bilateral rales up to mid-thorax, expiratory wheezes Abd: Soft, BS+, Distended w/ edema, non-tender, No rebound, no guarding Extm: ROM intact, Peripheral pulses intact, 3+ pitting edema up to upper abdomen Skin: Dry, Warm, normal turgor, no rashes, lesions, wounds.  Neuro: AAOx3  EKG: personally reviewed my interpretation is sinus tachycardia, normal axis, no ST elevation or depression, prolonged QTc, appear similar to prior EKG  CXR: personally reviewed my interpretation is rotated film, vascular congestion w/ cephalization, no lobar consolidation or pleural effusion.  Assessment & Plan by Problem: Active Problems:   Acute on chronic HFrEF (heart failure with reduced ejection fraction) (HCC)  [name] is a 21 yr M w/ PMH of unstable housing, COPD, HFrEF (EF 30-35% in 2018), schizoaffective disorder, hx of cocaine use, HTN and tobacco use presenting with shortness of breath  and lower extremity swelling most likely 2/2 acute on chronic systolic heart failure exacerbation. Based on his history, it appears to have been exacerbated by recent cocaine use. He was also discharged on 40mg  furosemide (2x20mg  tablets) but states he has been taking 1 tablet only until recently.He has significant edema and rales on exam with JVD. After receiving 40mg  IV, he had already put out about 1L by the time we evaluated him. He will need inpatient admission for IV diuresis until he reaches his dry weight, which on his last admission was known to be 83.2kg (183lbs). Unfortunately Mr.Loughridge have a difficult social situation and will require good discharge planning after he has been diuresed.  Acute on chronic systolic heart failure exacerbation 2/2 medication non-adherence vs dietary indiscretion vs cocaine use EF 35-40% in 2018. Home med 40mg  furosemide but per hx has been taking 20mg  daily. Unknown current weight. Received 40mg  IV lasix  in ED. BNP 1186. Vascular congestion on Chest X-ray. Anasarca on exam.  - Start IV Furosemide  BID - Trend BMP - Mag level, UDS - Strict I&Os - Daily Weights - Fluid restriction - Keep O2 sat >88 - Replenish K as needed >4.0  Troponemia 2/2 demand vs ACS Admit trop 0.04. No significant ischemic changes on EKG. Atypical intermittent chest pain lasting <1 Second. Heart score of 4. - Trend troponin x3  AKI 2/2 hypervolemia vs progression of CKD BUN 18, Creatinine 1.61. Baseline ~1.2  - Trend Bmp  COPD Wheezing on exam. No formal PFT on file. Currently 99% on RA. - Albuterol inhaler as needed for wheezing - Case management consult for nebulzier use - C/w home meds: Dulera 2 puffs BID  HTN Admit bp 163/103. Home regimen include amlodipine, carvedilol, clonidine and lisinopril - C/w home meds: lisinopril  daily, clonidine 0.1mg  daily  DVT prophx: Lovenox Diet: Cardiac w/ fluid restrict Code: Full  Dispo: Admit patient to Inpatient with  expected length of stay greater than 2 midnights.  Signed: Theotis Barrio, MD 03/27/2019, 9:34 PM  Pager: [phone]

## 2019-03-27 NOTE — ED Provider Notes (Signed)
Care assumed from Dr. Criss Alvine.  Please see his full H&P.  In short,  [name] is a 53 y.o. male presents for from home with increasingly short of breath and bilateral lower extremity edema and pain.  He was recently hospitalized for CHF exacerbation and discharged at his request x6 days ago.  He reports taking his lisinopril and using 4 puffs of albuterol inhaler without relief.  He continues to smoke. He denies any cough or fever. Denies sick contacts and anyone with known positive  COVID.  Labs and chest x-ray are pending.  Physical Exam  BP (!) 167/127   Pulse (!) 102   Temp 98.7 F (37.1 C) (Oral)   Resp (!) 26   Ht 5\' 9"  (1.753 m)   Wt 81.6 kg   SpO2 97%   BMI 26.58 kg/m   Physical Exam Vitals signs and nursing note reviewed.  Constitutional:      Appearance: He is well-developed. He is not toxic-appearing.  HENT:     Head: Normocephalic and atraumatic.     Nose: Nose normal.     Mouth/Throat:     Mouth: Mucous membranes are dry.  Eyes:     General: No scleral icterus.       Right eye: No discharge.        Left eye: No discharge.     Extraocular Movements: Extraocular movements intact.     Conjunctiva/sclera: Conjunctivae normal.     Pupils: Pupils are equal, round, and reactive to light.  Neck:     Musculoskeletal: Normal range of motion. No muscular tenderness.  Cardiovascular:     Rate and Rhythm: Regular rhythm. Tachycardia present.     Pulses: Normal pulses.     Heart sounds: Normal heart sounds.  Pulmonary:     Comments: Patient is tachypneic.  He is speaking in short sentences.  There is bilateral expiratory wheeze in upper fields.  Bases are clear laterally.  No accessory muscle use.  SPO2 is 95% on 2L nasal canula. Abdominal:     General: There is no distension.  Musculoskeletal: Normal range of motion.     Comments: Symmetrical 2+ pitting edema to bilateral lower extremities, edematous from knee to foot.  No upper leg edema noted.  Skin is warm and  dry.  Neurological:     Mental Status: He is oriented to person, place, and time.     Comments: Fluent speech, no facial droop.  Psychiatric:        Behavior: Behavior normal.     ED Course/Procedures     Results for orders placed or performed during the hospital encounter of 03/27/19 (from the past 24 hour(s))  Comprehensive metabolic panel     Status: Abnormal   Collection Time: 03/27/19  6:00 PM  Result Value Ref Range   Sodium 140 135 - 145 mmol/L   Potassium 4.5 3.5 - 5.1 mmol/L   Chloride 109 98 - 111 mmol/L   CO2 23 22 - 32 mmol/L   Glucose, Bld 84 70 - 99 mg/dL   BUN 18 6 - 20 mg/dL   Creatinine, Ser 4.31 (H) 0.61 - 1.24 mg/dL   Calcium 8.6 (L) 8.9 - 10.3 mg/dL   Total Protein 5.6 (L) 6.5 - 8.1 g/dL   Albumin 2.9 (L) 3.5 - 5.0 g/dL   AST 39 15 - 41 U/L   ALT 40 0 - 44 U/L   Alkaline Phosphatase 80 38 - 126 U/L   Total Bilirubin 1.4 (H) 0.3 -  1.2 mg/dL   GFR calc non Af Amer 49 (L) >60 mL/min   GFR calc Af Amer 57 (L) >60 mL/min   Anion gap 8 5 - 15  Troponin I - Once     Status: Abnormal   Collection Time: 03/27/19  6:00 PM  Result Value Ref Range   Troponin I 0.04 (HH) <0.03 ng/mL  CBC with Differential     Status: Abnormal   Collection Time: 03/27/19  6:00 PM  Result Value Ref Range   WBC 8.2 4.0 - 10.5 K/uL   RBC 4.12 (L) 4.22 - 5.81 MIL/uL   Hemoglobin 13.3 13.0 - 17.0 g/dL   HCT 45.4 09.8 - 11.9 %   MCV 100.2 (H) 80.0 - 100.0 fL   MCH 32.3 26.0 - 34.0 pg   MCHC 32.2 30.0 - 36.0 g/dL   RDW 14.7 (H) 82.9 - 56.2 %   Platelets 258 150 - 400 K/uL   nRBC 0.0 0.0 - 0.2 %   Neutrophils Relative % 58 %   Neutro Abs 4.8 1.7 - 7.7 K/uL   Lymphocytes Relative 30 %   Lymphs Abs 2.4 0.7 - 4.0 K/uL   Monocytes Relative 11 %   Monocytes Absolute 0.9 0.1 - 1.0 K/uL   Eosinophils Relative 1 %   Eosinophils Absolute 0.1 0.0 - 0.5 K/uL   Basophils Relative 0 %   Basophils Absolute 0.0 0.0 - 0.1 K/uL   Immature Granulocytes 0 %   Abs Immature Granulocytes 0.03 0.00  - 0.07 K/uL  Brain natriuretic peptide     Status: Abnormal   Collection Time: 03/27/19  6:00 PM  Result Value Ref Range   B Natriuretic Peptide 1,185.7 (H) 0.0 - 100.0 pg/mL      EKG Interpretation  Date/Time:  Sunday March 27 2019 17:58:08 EDT Ventricular Rate:  109 PR Interval:    QRS Duration: 83 QT Interval:  362 QTC Calculation: 488 R Axis:   40 Text Interpretation:  Sinus tachycardia Ventricular premature complex LAE, consider biatrial enlargement Borderline T wave abnormalities Borderline prolonged QT interval no significant change since March 15 2019 Confirmed by Pricilla Loveless [id number]) on 03/27/2019 6:16:42 PM        MDM   53 year old male presenting with shortness of breath and  bilateral leg pain with edema.  He has been compliant with lisinopril and Lasix however has continued to be symptomatic.  Patient states he has been doubling his Lasix for the last week and a half without symptom improvement.  Patient also admits to cocaine use 1 week ago as well as daily tobacco use.  On exam he is tachypneic, tachycardic, speaking in short sentences, also requiring 2 L of oxygen nasal cannula. Chest x-ray viewed by me shows pulmonary congestion. BNP elevated to 1185, slightly improved to when it was 1569.2 x 12 days ago during last admission.  IV lasix started CMP with elevated creatinine of 1.61, this is worse compared to 1.26 also during last admission.  Patient's troponin is elevated to 0.04 today.  Looking through medical records patient had troponin of 0.03 weeks ago.  Discussed with attending that elevation in troponin is likely due to demand given patient's CHF exacerbation.  Pt was admitted to IM service earlier this month with acute on chronic heart failure exacerbation and today his presentation and labs are suggesting the same.  This case was discussed with Dr. Criss Alvine who has seen the patient and agrees with plan to admit.   Spoke with IM service  who agrees to assume care  of patient and bring into the hospital for further evaluation and management.      This note was prepared with assistance of Conservation officer, historic buildings. Occasional wrong-word or sound-a-like substitutions may have occurred due to the inherent limitations of voice recognition software.    Kathyrn Lass 03/27/19 2108    Pricilla Loveless, MD 03/27/19 [phone]

## 2019-03-27 NOTE — ED Notes (Signed)
ED Provider at bedside. 

## 2019-03-27 NOTE — ED Notes (Signed)
Pt called RN in to room, pt stating "so what did the admitting doctor tell you?" RN unsure what pt inferring, but RN updated pt that he will be getting admitted. Pt states "well you need to page the doctor back I need to speak to them because they didn't do something they promised to do." RN asked pt what she may ask the doctor and pt refused to answer. RN asked again what she may page the doctor. Pt refused to answer RN. Pt given 2nd sandwich of the night. Pt then called out demanding to speak to charge RN. Charge RN came to bedside and pt now stating "no I'm good." Pt notified of bed assignment. Will continue to monitor.

## 2019-03-27 NOTE — ED Provider Notes (Signed)
[facility] EMERGENCY DEPARTMENT Provider Note   CSN: [id number] Arrival date & time: 03/27/19  1749    History   Chief Complaint Chief Complaint  Patient presents with  . Shortness of Breath    [name] is a 53 y.o. male.     HPI  53 year old male presents with leg swelling and shortness of breath.  He states this is progressively worsened over the last week and a half.  He was seen here on April 14 and states he has been taking the meds prescribed to him, including the Lasix.  He was supposed to be on it once per day but has been taking it twice per day.  He reports compliance with other meds.  He states the leg swelling is progressively worsening and he feels swollen up to his breasts.  He is also having cough with some clear sputum and shortness of breath.  The dyspnea is worse with laying flat or bending over or exertion.  Occasionally has some intermittent chest pain that he states is fleeting, lasting only a couple seconds and sharp.  No fevers.  No known contacts with COVID-19 patient. He's tried his albuterol inhaler without relief.  Past Medical History:  Diagnosis Date  . Asthma   . Bipolar 1 disorder (HCC)   . Depression   . Homelessness   . Hypertension   . Schizoaffective disorder [facility])     Patient Active Problem List   Diagnosis Date Noted  . COPD exacerbation (HCC)   . Acute exacerbation of CHF (congestive heart failure) (HCC) 03/04/2019  . Asthma 12/26/2016  . Tobacco use disorder 12/26/2016  . Schizophrenia (HCC) 12/26/2016  . Cocaine abuse with cocaine-induced mood disorder (HCC) 09/06/2016  . Alcohol use disorder, mild, abuse 07/31/2016  . Substance induced mood disorder (HCC) 07/30/2016  . Schizoaffective disorder, bipolar type (HCC) 05/31/2016  . Cocaine use disorder, severe, dependence (HCC) 05/31/2016  . Essential hypertension 05/31/2016    History reviewed. No pertinent surgical history.      Home Medications     Prior to Admission medications   Medication Sig Start Date End Date Taking? Authorizing Provider  ADVAIR DISKUS 250-50 MCG/DOSE AEPB Inhale 1 puff into the lungs 2 (two) times daily. 07/13/18   [provider]  albuterol (PROVENTIL HFA;VENTOLIN HFA) 108 (90 Base) MCG/ACT inhaler Inhale 1-2 puffs into the lungs every 4 (four) hours as needed for shortness of breath. 08/22/18   Dione Booze, MD  amLODipine (NORVASC) 10 MG tablet Take 1 tablet (10 mg total) by mouth daily. Patient not taking: Reported on 03/04/2019 12/30/16   Tomasita Crumble, MD  carvedilol (COREG) 6.25 MG tablet Take 1 tablet (6.25 mg total) by mouth 2 (two) times daily with a meal. 03/09/19   Claudean Severance, MD  carvedilol (COREG) 6.25 MG tablet Take 1 tablet (6.25 mg total) by mouth 2 (two) times daily with a meal. 03/15/19   Arby Barrette, MD  cyclobenzaprine (FLEXERIL) 10 MG tablet Take 1 tablet (10 mg total) by mouth 3 (three) times daily as needed for muscle spasms. Patient not taking: Reported on 03/04/2019 08/22/18   Dione Booze, MD  furosemide (LASIX) 20 MG tablet Take 2 tablets (40 mg total) by mouth daily. 03/09/19   Claudean Severance, MD  furosemide (LASIX) 20 MG tablet Take 1 tablet (20 mg total) by mouth daily. 03/15/19   Arby Barrette, MD  ipratropium (ATROVENT) 0.02 % nebulizer solution Take 2.5 mLs (0.5 mg total) by nebulization every  6 (six) hours as needed for wheezing or shortness of breath. 03/09/19   Claudean Severance, MD  lisinopril (PRINIVIL,ZESTRIL) 40 MG tablet Take 1 tablet (40 mg total) by mouth daily. 03/09/19   Claudean Severance, MD  omeprazole (PRILOSEC) 20 MG capsule Take 20 mg by mouth 2 (two) times daily. 07/13/18   [provider]    Family History Family History  Problem Relation Age of Onset  . Drug abuse Mother   . Suicidality Mother     Social History Social History   Tobacco Use  . Smoking status: Current Every Day Smoker    Types: Cigarettes  . Smokeless tobacco: Never  Used  Substance Use Topics  . Alcohol use: Yes    Comment: infrequently  . Drug use: Yes    Types: Cocaine     Allergies   Acetaminophen; Peanut butter flavor; and Latex   Review of Systems Review of Systems  Constitutional: Negative for fever.  Respiratory: Positive for cough and shortness of breath.   Cardiovascular: Positive for chest pain and leg swelling.  All other systems reviewed and are negative.    Physical Exam Updated Vital Signs BP (!) 169/117   Pulse (!) 106   Temp 98.7 F (37.1 C) (Oral)   Resp 15   Ht 5\' 9"  (1.753 m)   Wt 81.6 kg   SpO2 100%   BMI 26.58 kg/m   Physical Exam Vitals signs and nursing note reviewed.  Constitutional:      General: He is not in acute distress.    Appearance: He is well-developed. He is obese. He is not ill-appearing or diaphoretic.  HENT:     Head: Normocephalic and atraumatic.     Right Ear: External ear normal.     Left Ear: External ear normal.     Nose: Nose normal.  Eyes:     General:        Right eye: No discharge.        Left eye: No discharge.  Neck:     Musculoskeletal: Neck supple.  Cardiovascular:     Rate and Rhythm: Regular rhythm. Tachycardia present.     Heart sounds: Normal heart sounds.  Pulmonary:     Effort: Pulmonary effort is normal. No tachypnea, accessory muscle usage or respiratory distress.     Breath sounds: Wheezing (mild, expiratory) present.  Abdominal:     Palpations: Abdomen is soft.     Tenderness: There is no abdominal tenderness.  Musculoskeletal:     Right lower leg: Edema present.     Left lower leg: Edema present.     Comments: Pitting edema from his feet to his knees bilaterally.  Symmetric.  No pitting edema in the thighs or abdomen.  Skin:    General: Skin is warm and dry.  Neurological:     Mental Status: He is alert.  Psychiatric:        Mood and Affect: Mood is not anxious.      ED Treatments / Results  Labs (all labs ordered are listed, but only abnormal  results are displayed) Labs Reviewed  COMPREHENSIVE METABOLIC PANEL - Abnormal; Notable for the following components:      Result Value   Creatinine, Ser 1.61 (*)    Calcium 8.6 (*)    Total Protein 5.6 (*)    Albumin 2.9 (*)    Total Bilirubin 1.4 (*)    GFR calc non Af Amer 49 (*)    GFR calc Af  Amer 57 (*)    All other components within normal limits  TROPONIN I - Abnormal; Notable for the following components:   Troponin I 0.04 (*)    All other components within normal limits  CBC WITH DIFFERENTIAL/PLATELET - Abnormal; Notable for the following components:   RBC 4.12 (*)    MCV 100.2 (*)    RDW 17.8 (*)    All other components within normal limits  BRAIN NATRIURETIC PEPTIDE - Abnormal; Notable for the following components:   B Natriuretic Peptide 1,185.7 (*)    All other components within normal limits  RAPID URINE DRUG SCREEN, HOSP PERFORMED - Abnormal; Notable for the following components:   Cocaine POSITIVE (*)    All other components within normal limits  TROPONIN I  TROPONIN I  BASIC METABOLIC PANEL    EKG EKG Interpretation  Date/Time:  Sunday March 27 2019 17:58:08 EDT Ventricular Rate:  109 PR Interval:    QRS Duration: 83 QT Interval:  362 QTC Calculation: 488 R Axis:   40 Text Interpretation:  Sinus tachycardia Ventricular premature complex LAE, consider biatrial enlargement Borderline T wave abnormalities Borderline prolonged QT interval no significant change since March 15 2019 Confirmed by Pricilla Loveless [phone]) on 03/27/2019 6:16:42 PM   Radiology Dg Chest 2 View  Result Date: 03/27/2019 CLINICAL DATA:  53 year old male with shortness of breath and lower extremity swelling not improved with increased doses of Lasix recently. EXAM: CHEST - 2 VIEW COMPARISON:  03/15/2019 and earlier. FINDINGS: Semi upright AP and lateral views of the chest. Stable cardiomegaly and mediastinal contours. Stable lung volumes. Visualized tracheal air column is within normal  limits. No pneumothorax, pleural effusion or consolidation. Bilateral pulmonary vascularity has not significantly changed. Mild vascular congestion but no overt edema. Negative visible bowel gas pattern. No acute osseous abnormality identified. IMPRESSION: Stable cardiomegaly and mild pulmonary vascular congestion without overt edema. Electronically Signed   By: Odessa Fleming M.D.   On: 03/27/2019 19:09    Procedures Procedures (including critical care time)  Medications Ordered in ED Medications  enoxaparin (LOVENOX) injection 40 mg (has no administration in time range)  lisinopril (ZESTRIL) tablet 40 mg (has no administration in time range)  cloNIDine (CATAPRES) tablet 0.1 mg (has no administration in time range)  albuterol (PROVENTIL) (2.5 MG/3ML) 0.083% nebulizer solution 2.5 mg (has no administration in time range)  mometasone-formoterol (DULERA) 200-5 MCG/ACT inhaler 2 puff (has no administration in time range)  furosemide (LASIX) injection 40 mg (has no administration in time range)  aspirin chewable tablet 324 mg (324 mg Oral Given 03/27/19 1823)  albuterol (VENTOLIN HFA) 108 (90 Base) MCG/ACT inhaler 4 puff (4 puffs Inhalation Given 03/27/19 1823)  furosemide (LASIX) injection 40 mg (40 mg Intravenous Given 03/27/19 2004)     Initial Impression / Assessment and Plan / ED Course  I have reviewed the triage vital signs and the nursing notes.  Pertinent labs & imaging results that were available during my care of the patient were reviewed by me and considered in my medical decision making (see chart for details).        Patient's dyspnea is likely from fluid overload.  He is not distressed.  Low level troponin is probably CHF.  Will need diuresis and admission to the internal medicine teaching service for symptomatic care.  [name] was evaluated in Emergency Department on 03/27/2019 for the symptoms described in the history of present illness. He was evaluated in the context of the  global COVID-19 pandemic,  which necessitated consideration that the patient might be at risk for infection with the SARS-CoV-2 virus that causes COVID-19. Institutional protocols and algorithms that pertain to the evaluation of patients at risk for COVID-19 are in a state of rapid change based on information released by regulatory bodies including the CDC and federal and state organizations. These policies and algorithms were followed during the patient's care in the ED.   Final Clinical Impressions(s) / ED Diagnoses   Final diagnoses:  Acute on chronic congestive heart failure, unspecified heart failure type (HCC)  AKI (acute kidney injury) (HCC)  Bilateral lower extremity edema    ED Discharge Orders    None       Pricilla Loveless, MD 03/27/19 2253

## 2019-03-27 NOTE — ED Notes (Signed)
Patient transported to X-ray 

## 2019-03-27 NOTE — ED Notes (Signed)
Date and time results received: 03/27/19 1914   Test: Troponin Critical Value: 0.04  Name of Provider Notified: Albrizze, K. PA

## 2019-03-28 ENCOUNTER — Inpatient Hospital Stay (HOSPITAL_COMMUNITY): Payer: Medicaid Other

## 2019-03-28 DIAGNOSIS — I361 Nonrheumatic tricuspid (valve) insufficiency: Secondary | ICD-10-CM

## 2019-03-28 DIAGNOSIS — Z59819 Housing instability, housed unspecified: Secondary | ICD-10-CM

## 2019-03-28 DIAGNOSIS — Z599 Problem related to housing and economic circumstances, unspecified: Secondary | ICD-10-CM

## 2019-03-28 DIAGNOSIS — N179 Acute kidney failure, unspecified: Secondary | ICD-10-CM | POA: Diagnosis present

## 2019-03-28 DIAGNOSIS — R600 Localized edema: Secondary | ICD-10-CM | POA: Diagnosis present

## 2019-03-28 LAB — TROPONIN I: Troponin I: 0.05 ng/mL (ref <0.03)

## 2019-03-28 LAB — BASIC METABOLIC PANEL
Anion gap: 12 (ref 5–15)
BUN: 20 mg/dL (ref 6–20)
CO2: 23 mmol/L (ref 22–32)
Calcium: 8.3 mg/dL — ABNORMAL LOW (ref 8.9–10.3)
Chloride: 107 mmol/L (ref 98–111)
Creatinine, Ser: 1.55 mg/dL — ABNORMAL HIGH (ref 0.61–1.24)
GFR calc Af Amer: 60 mL/min — ABNORMAL LOW (ref >60)
GFR calc non Af Amer: 51 mL/min — ABNORMAL LOW (ref >60)
Glucose, Bld: 93 mg/dL (ref 70–99)
Potassium: 3.5 mmol/L (ref 3.5–5.1)
Sodium: 142 mmol/L (ref 135–145)

## 2019-03-28 LAB — ECHOCARDIOGRAM LIMITED
Height: 69 in
Weight: 3294.4 oz

## 2019-03-28 MED ORDER — POTASSIUM CHLORIDE CRYS ER 20 MEQ PO TBCR
40.0000 meq | EXTENDED_RELEASE_TABLET | Freq: Once | ORAL | Status: AC
  Administered 2019-03-28: 40 meq via ORAL
  Filled 2019-03-28: qty 2

## 2019-03-28 MED ORDER — DM-GUAIFENESIN ER 30-600 MG PO TB12
1.0000 | ORAL_TABLET | Freq: Two times a day (BID) | ORAL | Status: DC
  Administered 2019-03-28 – 2019-03-29 (×3): 1 via ORAL
  Filled 2019-03-28 (×3): qty 1

## 2019-03-28 MED ORDER — HYDROXYZINE HCL 25 MG PO TABS: 25.0000 mg | ORAL_TABLET | Freq: Three times a day (TID) | ORAL | Status: DC | PRN

## 2019-03-28 MED ORDER — RAMELTEON 8 MG PO TABS
8.0000 mg | ORAL_TABLET | Freq: Every day | ORAL | Status: DC
  Administered 2019-03-28: 8 mg via ORAL
  Filled 2019-03-28 (×2): qty 1

## 2019-03-28 MED ORDER — FUROSEMIDE 10 MG/ML IJ SOLN
40.0000 mg | Freq: Three times a day (TID) | INTRAMUSCULAR | Status: DC
  Administered 2019-03-28 (×2): 40 mg via INTRAVENOUS
  Filled 2019-03-28 (×3): qty 4

## 2019-03-28 MED ORDER — RAMELTEON 8 MG PO TABS
8.0000 mg | ORAL_TABLET | Freq: Once | ORAL | Status: AC
  Administered 2019-03-28: 8 mg via ORAL
  Filled 2019-03-28 (×2): qty 1

## 2019-03-28 NOTE — TOC Initial Note (Signed)
Transition of Care [facility]) - Initial/Assessment Note    Patient Details  Name: [name] MRN: [id number] Date of Birth: [birth year]  Transition [facility]) CM/SW Contact:    Reola Mosher Phone Number: [phone] 03/28/2019, 10:29 AM  Clinical Narrative:                 CM talked to patient; no PCP; CM offered to assist pt with finding a PCP ( possibly [facility]) he refused stated " I don't know where I will be living  - [place] or [place]. Has private insurance with Medicaid with prescription drug coverage; [facility]; noted substance abuse, pt is refusing to talk to the SW about getting any resources, he is requesting a Nebulizer machine at discharge. Attending MD please send scripts to [facility] pharmacy at discharge- meds will be delivered to the room prior to discharge home.  Expected Discharge Plan: Home/Self Care Barriers to Discharge: No Barriers Identified   Patient Goals and CMS Choice Patient states their goals for this hospitalization and ongoing recovery are:: to get out of here CMS Medicare.gov Compare Post Acute Care list provided to:: Patient Choice offered to / list presented to : NA  Expected Discharge Plan and Services Expected Discharge Plan: Home/Self Care In-house Referral: Clinical Social Work(Pt refusing to talk to SW) Discharge Planning Services: CM Consult Post Acute Care Choice: NA Living arrangements for the past 2 months: (pt has not decided where he will live at this time)                 DME Arranged: N/A DME Agency: NA       HH Arranged: NA HH Agency: NA        Prior Living Arrangements/Services Living arrangements for the past 2 months: (pt has not decided where he will live at this time)   Patient language and need for interpreter reviewed:: Yes Do you feel safe going back to the place where you live?: Yes      Need for Family Participation in Patient Care: No (Comment)(pt is  independent of his ADL's) Care giver support system in place?: No (comment)   Criminal Activity/Legal Involvement Pertinent to Current Situation/Hospitalization: No - Comment as needed  Activities of Daily Living      Permission Sought/Granted Permission sought to share information with : Case Manager                Emotional Assessment Appearance:: Developmentally appropriate Attitude/Demeanor/Rapport: Engaged Affect (typically observed): Accepting Orientation: : Oriented to Self, Oriented to  Time, Oriented to Place, Oriented to Situation Alcohol / Substance Use: Illicit Drugs Psych Involvement: No (comment)  Admission diagnosis:  AKI (acute kidney injury) (HCC) [N17.9] Bilateral lower extremity edema [R60.0] Acute on chronic congestive heart failure, unspecified heart failure type [facility]) [I50.9] Patient Active Problem List   Diagnosis Date Noted  . AKI (acute kidney injury) (HCC)   . Bilateral lower extremity edema   . Acute on chronic HFrEF (heart failure with reduced ejection fraction) (HCC) 03/27/2019  . COPD exacerbation (HCC)   . Acute exacerbation of CHF (congestive heart failure) (HCC) 03/04/2019  . Asthma 12/26/2016  . Tobacco use disorder 12/26/2016  . Schizophrenia (HCC) 12/26/2016  . Cocaine abuse with cocaine-induced mood disorder (HCC) 09/06/2016  . Alcohol use disorder, mild, abuse 07/31/2016  . Substance induced mood disorder (HCC) 07/30/2016  . Schizoaffective disorder, bipolar type (HCC) 05/31/2016  . Cocaine use disorder, severe, dependence (HCC) 05/31/2016  .  Essential hypertension 05/31/2016   PCP:  Medicine, [facility]:   [id number] [place], [place] - [address] AT [address] [name] [address] Phone: [phone] Fax: [phone]     Social Determinants of Health (SDOH) Interventions    Readmission Risk Interventions No flowsheet data found.

## 2019-03-28 NOTE — Progress Notes (Signed)
RN paged internal medicine, concerning pt refusing to communicate, so admission hx could be completed.  Also, pt has been refusing treatment and going against nursing judgement to assist with breathing.  Pt. Requested breathing treatment  And would not keep O2 [place] on during this time.  Dr. Nedra Hai was notified twice throughout the shift.  MD instructed nurse to writhe a note about pt not being cooperative.  MD later order medication to assist the pt in resting toward the end of shift.  Information has been communicated to daytime RN(Velia).

## 2019-03-28 NOTE — Progress Notes (Signed)
Paged by nursing staff about [name] refusing labs and Lovenox. Evaluated him at bedside. He states he 'has been in the hospital before and knows what it's for and I don't need it.' Discussed the risks and benefits of not tracking his troponin and renal function as well as not receiving DVT prophylaxis. Discussed possibility of missing an MI, pulmonary embolism or DVT and importance of receiving his medications in the hospital promptly. [name] expressed understanding and repeatedly mentions that he knows all the risks and states he just wants to be left alone.

## 2019-03-28 NOTE — Progress Notes (Signed)
Subjective: Patient reports he is continuing to have shortness of breath, orthopnea and productive cough with clear phlegm. He feels swollen up to his abdomen. He states he left before completing the advised treatment during his last admission and expressed understanding that he will need to be here for a few days at the minimum. He states he has to move Thursday. He last used cocaine about a week ago, 4 lines. He states he has not drank alcohol in about 2 weeks to a month. He is asking for a home nebulizing machine as he thinks this has helped his breathing. We had a discussion of how cocaine is most likely worsening his heart failure and causing chest pain that he has had. He expressed understanding, states he would like to not use again but cannot guarantee he will quit. All questions and concerns addressed.   Objective:  Vital signs in last 24 hours: Vitals:   03/27/19 2115 03/27/19 2130 03/27/19 2214 03/28/19 0100  BP: (!) 163/103 (!) 156/104 (!) 165/111   Pulse: (!) 107 96 (!) 101 100  Resp: (!) 23  (!) 26 (!) 24  Temp:   98.4 F (36.9 C)   TempSrc:   Oral   SpO2: 97% 100% 100% 100%  Weight:   93.4 kg   Height:   5\' 9"  (1.753 m)    Physical Exam Gen: seen laying comfortably in bed, no distress CV: RRR, no murmurs  Pulm: expiratory wheezing, rales greater on the right than left up to mid thorax  Abdomen: bowel sounds present, distended, no ttp Ext: bilateral LE 2+ pitting edema  HENT: + JVD  Assessment/Plan:  Active Problems:   Acute on chronic HFrEF (heart failure with reduced ejection fraction) (HCC)   AKI (acute kidney injury) (HCC)   Bilateral lower extremity edema   [name] is a 76 yr M w/ PMH of unstable housing, COPD, HFrEF (EF 30-35% in 2018), schizoaffective disorder, hx of cocaine use, HTN and tobacco use presenting with shortness of breath and lower extremity swelling most likely 2/2 acute on chronic systolic heart failure exacerbation. Likely exacerbating by  cocaine use and noncompliance with medications.  Acute on chronic systolic heart failure exacerbation 2/2 medication non-adherence vs dietary indiscretion vs cocaine use: EF 35-40% in 2018. Earlier this month he was discharged to continue furosemide 40 mg twice a day but patient has only been taking 20 mg daily. Started on furosemide 40 mg IV with good urinary output, ~3.8 L. Dry weight at last admission 83.2 kg, he is 93.4 kg today. Will increase furosemide 40 mg IV to TID.  - Continue IV Furosemide 40mg  TID - Trend BMP - Strict I&Os - Daily Weights - Fluid restriction - Keep O2 sat >88 - Replenish K as needed >4.0  Elevated troponin: likely due to demand ischemia. Repeat troponin 0.05 - Trend troponin x3  AKI 2/2 hypervolemia vs progression of CKD: Cr 1.55, on admission 1.61. Baseline ~1.2  - Trend Bmp  COPD: Wheezing on exam. No formal PFT on file. Currently 99% on RA. - Albuterol inhaler as needed for wheezing - Case management consult for nebulzier use - C/w home meds: Dulera 2 puffs BID  HTN: Home regimen include amlodipine, carvedilol, clonidine and lisinopril - C/w home meds: lisinopril 40mg  daily, clonidine 0.1mg  daily  Dispo: Anticipated discharge pending clinical improvement. Patient is ~25 lbs above his dry weight so will likely need multiple days of IV diuresis.   Jaci Standard, DO 03/28/2019, 6:48 AM Pager: [phone]

## 2019-03-28 NOTE — Progress Notes (Signed)
  Echocardiogram 2D Echocardiogram has been performed.  Patient told tech to hurry up and to not press any harder. Unable to attempt definity due to patient poor compliance.   Jaanai Salemi L Androw 03/28/2019, 9:59 AM

## 2019-03-28 NOTE — Progress Notes (Addendum)
Per CCMD patient had 20 bts run of VTach. Pt is currently lying down with eyes clothes.  vitas are as follows:   03/28/19 1129  Vitals  Temp 97.8 F (36.6 C)  Temp Source Oral  BP 127/83  MAP (mmHg) 97  BP Location Right Arm  BP Method Automatic  Patient Position (if appropriate) Lying  Pulse Rate 89  Pulse Rate Source Monitor  Resp 20  Oxygen Therapy  SpO2 100 %  O2 Device Room Air  MEWS Score  MEWS RR 0  MEWS Pulse 0  MEWS Systolic 0  MEWS LOC 0  MEWS Temp 0  MEWS Score 0  MEWS Score Color Green   paged  Rehman MD to make aware.  Monitor ekg printed and placed in pt's chart.

## 2019-03-29 ENCOUNTER — Other Ambulatory Visit: Payer: Self-pay | Admitting: Internal Medicine

## 2019-03-29 LAB — BASIC METABOLIC PANEL
Anion gap: 7 (ref 5–15)
BUN: 19 mg/dL (ref 6–20)
CO2: 33 mmol/L — ABNORMAL HIGH (ref 22–32)
Calcium: 8.6 mg/dL — ABNORMAL LOW (ref 8.9–10.3)
Chloride: 101 mmol/L (ref 98–111)
Creatinine, Ser: 1.4 mg/dL — ABNORMAL HIGH (ref 0.61–1.24)
GFR calc Af Amer: >60 mL/min (ref >60)
GFR calc non Af Amer: 58 mL/min — ABNORMAL LOW (ref >60)
Glucose, Bld: 96 mg/dL (ref 70–99)
Potassium: 3.9 mmol/L (ref 3.5–5.1)
Sodium: 141 mmol/L (ref 135–145)

## 2019-03-29 MED ORDER — HYDROXYZINE HCL 25 MG PO TABS: 25.0000 mg | ORAL_TABLET | Freq: Three times a day (TID) | ORAL | 0 refills | Status: DC | PRN

## 2019-03-29 MED ORDER — FUROSEMIDE 10 MG/ML IJ SOLN: 40.0000 mg | Freq: Two times a day (BID) | INTRAMUSCULAR | Status: DC

## 2019-03-29 MED ORDER — CLONIDINE HCL 0.1 MG PO TABS: 0.1000 mg | ORAL_TABLET | Freq: Every day | ORAL | 2 refills | Status: DC

## 2019-03-29 MED ORDER — LISINOPRIL 40 MG PO TABS: 40.0000 mg | ORAL_TABLET | Freq: Every day | ORAL | 2 refills | Status: DC

## 2019-03-29 MED ORDER — FUROSEMIDE 20 MG PO TABS: 40.0000 mg | ORAL_TABLET | Freq: Every day | ORAL | 0 refills | Status: DC

## 2019-03-29 MED ORDER — DM-GUAIFENESIN ER 30-600 MG PO TB12: 1.0000 | ORAL_TABLET | Freq: Two times a day (BID) | ORAL | 0 refills | Status: DC

## 2019-03-29 MED ORDER — CARVEDILOL 6.25 MG PO TABS: 6.2500 mg | ORAL_TABLET | Freq: Two times a day (BID) | ORAL | 2 refills | Status: DC

## 2019-03-29 MED ORDER — FUROSEMIDE 10 MG/ML IJ SOLN
40.0000 mg | Freq: Two times a day (BID) | INTRAMUSCULAR | Status: DC
  Administered 2019-03-29: 09:00:00 40 mg via INTRAVENOUS

## 2019-03-29 MED ORDER — TERBINAFINE HCL 1 % EX CREA
TOPICAL_CREAM | Freq: Two times a day (BID) | CUTANEOUS | Status: DC
  Filled 2019-03-29: qty 12

## 2019-03-29 MED ORDER — CARVEDILOL 6.25 MG PO TABS: 6.2500 mg | ORAL_TABLET | Freq: Two times a day (BID) | ORAL | Status: DC

## 2019-03-29 NOTE — Progress Notes (Signed)
   Subjective: Patient reports he feels better today. His breathing and lower extremity swelling has improved. He is complaining of a soreness in both of his feet. Denies cramping. He refused labs during third shift but states he worked with first shift. He states he would like to go home tomorrow if possible because he is moving on Thursday but states he understands he may need to stay longer. All questions and concerns addressed.   Objective:  Vital signs in last 24 hours: Vitals:   03/28/19 0859 03/28/19 1129 03/28/19 1630 03/28/19 2106  BP: (!) 173/107 127/83 (!) 154/98 124/81  Pulse: 95 89 92 83  Resp:  20 (!) 21 (!) 24  Temp:  97.8 F (36.6 C) 98.3 F (36.8 C)   TempSrc:  Oral Oral   SpO2: 99% 100% 100% 98%  Weight:      Height:       Physical Exam Gen: seen comfortably laying in bed, no distress CV: RRR, no murmurs Pulm: expiratory wheezing, minimal rales on exam today  Ext: bilateral nonpitting trace edema in bilateral feet, much improved since yesterday  Assessment/Plan:  Principal Problem:   Acute on chronic HFrEF (heart failure with reduced ejection fraction) (HCC) Active Problems:   Schizoaffective disorder, bipolar type (HCC)   Cocaine use disorder, severe, dependence (HCC)   Essential hypertension   Tobacco use disorder   AKI (acute kidney injury) (HCC)   Bilateral lower extremity edema   Housing situation unstable  [name] is a 85 yr M w/ PMH ofunstable housing,COPD, HFrEF(EF 30-35% in 2018),schizoaffective disorder, hx of cocaine use, HTN and tobacco use presenting with shortness of breath and lower extremity swelling most likely 2/2 acute on chronic systolic heart failure exacerbation.Likely exacerbating by cocaine use and noncompliance with medications.  Acute on chronic systolic heart failure exacerbation2/2 medication non-adherence vs dietary indiscretion vs cocaine use: EF 35-40% in 2018. Increased IV furosemide to 40 mg TID yesterday and  patient had about 10.7L output in the past 24 hours. Patient states he is amenable to being weighed later this morning. Will decrease furosemide to BID.  - Continue IV Furosemide40mg  BID - Trend BMP - Strict I&Os - Daily Weights - Fluid restriction - Keep O2 sat >88 - Replenish K as needed >4.0  AKI 2/2 hypervolemiavs progression of CKD: Cr 1.4, on admission 1.61. Baseline ~1.2  - Trend Bmp  COPD: Wheezing on exam. Saturating well on RA. - Albuterol inhaler as needed for wheezing - Case management consult for nebulzier use - C/w home meds: Dulera 2 puffs BID  HTN: Home regimen include amlodipine, carvedilol, clonidine and lisinopril - C/w home meds: lisinopril 40mg  daily, clonidine 0.1mg  daily - Will restart carvedilol tomorrow  Dispo: Anticipated discharge pending clinical improvement. Patient is still above his dry weight so will likely need multiple days of IV diuresis.    Jaci Standard, DO 03/29/2019, 6:53 AM Pager: [phone]

## 2019-03-29 NOTE — Progress Notes (Signed)
Pt refused labs this morning states that "he will wait for 1st shift to do hs labs. "  Pt was educated and still refused. Will continue to monitor.

## 2019-03-29 NOTE — Discharge Summary (Addendum)
Name: [name] MRN: [id number] DOB: Feb 13, 1966 53 y.o. PCP: Medicine, [facility]  Date of Admission: 03/27/2019  5:49 PM Date of Discharge: 03/29/2019 Attending Physician: Jessy Oto MD   Discharge Diagnosis: 1.  Acute on chronic systolic failure exacerbation 2.  AKI 3.  COPD   Discharge Medications: Allergies as of 03/29/2019      Reactions   Acetaminophen Hives, Nausea And Vomiting, Rash   Pt overdosed on bottle of APAP 05/06/16   Peanut Butter Flavor Itching, Nausea And Vomiting   Patient doesn't recognize, but it's been in Epic since 2019 (leaving it)   Latex Rash      Medication List    STOP taking these medications   amLODipine 10 MG tablet Commonly known as:  NORVASC   cyclobenzaprine 10 MG tablet Commonly known as:  FLEXERIL   lisinopril 40 MG tablet Commonly known as:  ZESTRIL   penicillin v potassium 500 MG tablet Commonly known as:  VEETID   predniSONE 20 MG tablet Commonly known as:  DELTASONE     TAKE these medications   Advair Diskus 250-50 MCG/DOSE Aepb Generic drug:  Fluticasone-Salmeterol Inhale 1 puff into the lungs 2 (two) times daily.   albuterol 108 (90 Base) MCG/ACT inhaler Commonly known as:  VENTOLIN HFA Inhale 1-2 puffs into the lungs every 4 (four) hours as needed for shortness of breath.   ammonium lactate 12 % lotion Commonly known as:  LAC-HYDRIN Apply 1 application topically 2 (two) times a day. To feet   carvedilol 6.25 MG tablet Commonly known as:  Coreg Take 1 tablet (6.25 mg total) by mouth 2 (two) times daily with a meal. What changed:  Another medication with the same name was removed. Continue taking this medication, and follow the directions you see here.   cloNIDine 0.1 MG tablet Commonly known as:  CATAPRES Take 1 tablet (0.1 mg total) by mouth daily.   dextromethorphan-guaiFENesin 30-600 MG 12hr tablet Commonly known as:  MUCINEX DM Take 1 tablet by mouth 2 (two) times daily.   furosemide  20 MG tablet Commonly known as:  LASIX Take 2 tablets (40 mg total) by mouth daily.   hydrOXYzine 25 MG tablet Commonly known as:  ATARAX/VISTARIL Take 1 tablet (25 mg total) by mouth 3 (three) times daily as needed for itching or anxiety.   ipratropium 0.02 % nebulizer solution Commonly known as:  ATROVENT Take 2.5 mLs (0.5 mg total) by nebulization every 6 (six) hours as needed for wheezing or shortness of breath.       Disposition and follow-up:   [name] was discharged from [facility] in Stable condition.  At the hospital follow up visit please address:  1.  CHF: Patient was admitted with a heart failure exacerbation likely secondary to medication nonadherence, dietary indiscretion and cocaine use.  Was counseled on the importance of discontinuing cocaine use at this time and continuing to take furosemide 40 mg daily.  Please make sure patient has been compliant with medications  2.  Labs / imaging needed at time of follow-up: BMP  3.  Pending labs/ test needing follow-up: None  Follow-up Appointments: Follow-up Information    Medicine, [facility]   Specialty:  Family Medicine Contact information: [address] [name] [address] [phone]           Hospital Course by problem list:  1.  Acute on chronic systolic failure exacerbation-patient presented with shortness of breath and lower extremity swelling.  He had significant edema and JVD on exam.  BNP 1186 and vascular congestion on chest x-ray.  He was recently discharged from the hospital continue 40 mg furosemide however patient was taking 20 mg daily.  Last discharge dry weight was around 83.2 kg (183lbs).  He was about 25 pounds above this on admission.  He was started on IV furosemide 40 mg TID with about 10.7 L output in 24 hours which brought him down to his dry weight at 81.8 kg(180 lbs). He still had mildly elevated JVP and we planned to continue diuresis, but he had  to leave for work. He was discharged to continue furosemide 40 mg daily, carvedilol 6.25 mg BID, and lisinopril 40 mg daily, and follow up with PCP in one week. Also referred to cardiology as he does not have a cardiologist. We reinforced the importance of avoiding cocaine completely. Carvedilol was continued as the benefits likely outweigh the risks, especially if he stays off cocaine.   2.  AKI- Cr 1.6 on admission, baseline 1.2. Likely due to hypervolemia as this improved with diuresis. Cr 1.4 day of discharge.   3.  COPD-patient had bilateral expiratory wheezing on exam but no formal PFTs on file.  Saturating well on room air.  He was continued on albuterol inhaler and Dulera. Case management consulted for home nebulizer.  Discharge Vitals:   BP 118/70 (BP Location: Right Arm)    Pulse 91    Temp 98.1 F (36.7 C) (Oral)    Resp 18    Ht 5\' 9"  (1.753 m)    Wt 81.8 kg    SpO2 100%    BMI 26.64 kg/m   Pertinent Labs, Studies, and Procedures:   BMP Latest Ref Rng & Units 03/29/2019 03/28/2019 03/27/2019  Glucose 70 - 99 mg/dL 96 93 84  BUN 6 - 20 mg/dL 19 20 18   Creatinine 0.61 - 1.24 mg/dL 1.61(W1.40(H) 9.60(A1.55(H) 5.40(J1.61(H)  Sodium 135 - 145 mmol/L 141 142 140  Potassium 3.5 - 5.1 mmol/L 3.9 3.5 4.5  Chloride 98 - 111 mmol/L 101 107 109  CO2 22 - 32 mmol/L 33(H) 23 23  Calcium 8.9 - 10.3 mg/dL 8.1(X8.6(L) 8.3(L) 8.6(L)   CBC Latest Ref Rng & Units 03/27/2019 03/15/2019 03/06/2019  WBC 4.0 - 10.5 K/uL 8.2 5.1 6.2  Hemoglobin 13.0 - 17.0 g/dL 91.413.3 78.213.1 12.1(L)  Hematocrit 39.0 - 52.0 % 41.3 39.7 37.6(L)  Platelets 150 - 400 K/uL 258 204 231   Dg Chest 2 View  Result Date: 03/27/2019 CLINICAL DATA:  53 year old male with shortness of breath and lower extremity swelling not improved with increased doses of Lasix recently. EXAM: CHEST - 2 VIEW COMPARISON:  03/15/2019 and earlier. FINDINGS: Semi upright AP and lateral views of the chest. Stable cardiomegaly and mediastinal contours. Stable lung volumes.  Visualized tracheal air column is within normal limits. No pneumothorax, pleural effusion or consolidation. Bilateral pulmonary vascularity has not significantly changed. Mild vascular congestion but no overt edema. Negative visible bowel gas pattern. No acute osseous abnormality identified. IMPRESSION: Stable cardiomegaly and mild pulmonary vascular congestion without overt edema. Electronically Signed   By: Odessa FlemingH  Hall M.D.   On: 03/27/2019 19:09    Discharge Instructions: Discharge Instructions    (HEART FAILURE PATIENTS) Call MD:  Anytime you have any of the following symptoms: 1) 3 pound weight gain in 24 hours or 5 pounds in 1 week 2) shortness of breath, with or without a dry hacking cough 3) swelling in the hands, feet  or stomach 4) if you have to sleep on extra pillows at night in order to breathe.   Complete by:  As directed    AMB referral to CHF clinic   Complete by:  As directed    Diet - low sodium heart healthy   Complete by:  As directed    Discharge instructions   Complete by:  As directed    We are discharging you at your request. Please take all the medications as directed. Follow up with your PCP within one week. You are being referred to see a heart failure specialist, please make your appointment at your convenience.   Increase activity slowly   Complete by:  As directed       Signed: Rehman, Areeg N, DO 03/30/2019, 11:30 AM     Internal Medicine Attending Note:  I saw and examined the patient on the day of discharge. I reviewed and agree with the discharge summary written by the house staff.  Jessy Oto, M.D., Ph.D.

## 2019-03-29 NOTE — Progress Notes (Signed)
Pt refused Vitals and Weight this morning. Pt was educated on the importance of obtaining daily weight and morning vitals. Pt is still refusing at this time.

## 2019-03-29 NOTE — TOC Progression Note (Addendum)
Transition of Care [facility]) - Progression Note    Patient Details  Name: [name] MRN: [id number] Date of Birth: September 02, 1966  Transition of Care [facility]) CM/SW [place], [name] Phone Number: 03/29/2019, 1:12 PM  Clinical Narrative: Readmission prevention screening complete.  Per RN, patient now agreeable to substance abuse resources and interested in residential treatment. CSW met with patient and confirmed. His first preference is [facility] in [place]. CSW left voicemail for [name] at this facility. Second preference is [facility] in [place]. CSW spoke with [name], director of this program. He stated that they are not taking new referrals due to COVID-19 social distancing policies. They are currently at the maximum number of people they can have while this is going on. Patient understands that COVID-19 restrictions may cause an issue with placement and that he may have to look into placement after discharge.  3:17 pm: Received call back from [name] at [facility]. They will not have a bed available until May 25 but will go ahead and put patient on the list. They will call if there is a cancellation/no-show so patient can admit earlier. Patient is agreeable. He will not have a phone until Thursday so he will call them once he has a phone number to provide this information. CSW will need to fill out and fax a physician statement for the MD to sign that shows what medications he is on. Patient is eager to leave today, saying his boss has work for him to do. MD and RN are aware. MD is working on discharge orders.  Expected Discharge Plan: Home/Self Care Barriers to Discharge: No Barriers Identified  Expected Discharge Plan and Services Expected Discharge Plan: Home/Self Care In-house Referral: Clinical Social Work(Pt refusing to talk to SW) Discharge Planning Services: CM Consult Post Acute Care Choice: NA Living arrangements for the past 2  months: (pt has not decided where he will live at this time)                 DME Arranged: N/A DME Agency: NA       HH Arranged: NA [place] Agency: NA         Social Determinants of Health (SDOH) Interventions    Readmission Risk Interventions Readmission Risk Prevention Plan 03/29/2019  Transportation Screening Complete  PCP or Specialist Appt within 3-5 Days Patient refused  Social Work Consult for [place] Planning/Counseling Complete  Palliative Care Screening Not Applicable  Medication Review Press photographer) Complete  Some recent data might be hidden

## 2019-03-29 NOTE — TOC Transition Note (Signed)
Transition of Care [facility]) - CM/SW Discharge Note   Patient Details  Name: [name] MRN: [id number] Date of Birth: 20-Jul-1966  Transition of Care [facility]) CM/SW Contact:  Margarito Liner, [name] Phone Number: 03/29/2019, 4:07 PM   Clinical Narrative: Patient discharged home today. MD will sign physician's statement for [facility] tomorrow. Will fax once signed.  CSW signing off.  Final next level of care: Home/Self Care Barriers to Discharge: Barriers Resolved   Patient Goals and CMS Choice Patient states their goals for this hospitalization and ongoing recovery are:: to get out of here CMS Medicare.gov Compare Post Acute Care list provided to:: Patient Choice offered to / list presented to : NA  Discharge Placement                       Discharge Plan and Services In-house Referral: Clinical Social Work(Pt refusing to talk to SW) Discharge Planning Services: CM Consult Post Acute Care Choice: NA          DME Arranged: N/A DME Agency: NA       HH Arranged: NA HH Agency: NA        Social Determinants of Health (SDOH) Interventions     Readmission Risk Interventions Readmission Risk Prevention Plan 03/29/2019  Transportation Screening Complete  PCP or Specialist Appt within 3-5 Days Patient refused  Social Work Consult for Recovery Care Planning/Counseling Complete  Palliative Care Screening Not Applicable  Medication Review Oceanographer) Complete  Some recent data might be hidden

## 2019-04-04 ENCOUNTER — Other Ambulatory Visit: Payer: Self-pay

## 2019-04-04 ENCOUNTER — Encounter (HOSPITAL_COMMUNITY): Payer: Self-pay

## 2019-04-04 ENCOUNTER — Inpatient Hospital Stay (HOSPITAL_COMMUNITY)
Admission: EM | Admit: 2019-04-04 | Discharge: 2019-04-06 | DRG: 291 | Disposition: A | Discharge status: Home / Self Care | Payer: Medicaid Other | Attending: Internal Medicine | Admitting: Internal Medicine

## 2019-04-04 ENCOUNTER — Emergency Department (HOSPITAL_COMMUNITY): Payer: Medicaid Other

## 2019-04-04 DIAGNOSIS — Z9114 Patient's other noncompliance with medication regimen: Secondary | ICD-10-CM

## 2019-04-04 DIAGNOSIS — I5023 Acute on chronic systolic (congestive) heart failure: Secondary | ICD-10-CM | POA: Diagnosis present

## 2019-04-04 DIAGNOSIS — Z91010 Allergy to peanuts: Secondary | ICD-10-CM

## 2019-04-04 DIAGNOSIS — F1490 Cocaine use, unspecified, uncomplicated: Secondary | ICD-10-CM

## 2019-04-04 DIAGNOSIS — Z813 Family history of other psychoactive substance abuse and dependence: Secondary | ICD-10-CM

## 2019-04-04 DIAGNOSIS — I50814 Right heart failure due to left heart failure: Secondary | ICD-10-CM

## 2019-04-04 DIAGNOSIS — Z888 Allergy status to other drugs, medicaments and biological substances status: Secondary | ICD-10-CM

## 2019-04-04 DIAGNOSIS — F319 Bipolar disorder, unspecified: Secondary | ICD-10-CM | POA: Diagnosis present

## 2019-04-04 DIAGNOSIS — Z20828 Contact with and (suspected) exposure to other viral communicable diseases: Secondary | ICD-10-CM | POA: Diagnosis present

## 2019-04-04 DIAGNOSIS — Z79899 Other long term (current) drug therapy: Secondary | ICD-10-CM

## 2019-04-04 DIAGNOSIS — J439 Emphysema, unspecified: Secondary | ICD-10-CM

## 2019-04-04 DIAGNOSIS — R778 Other specified abnormalities of plasma proteins: Secondary | ICD-10-CM

## 2019-04-04 DIAGNOSIS — G629 Polyneuropathy, unspecified: Secondary | ICD-10-CM | POA: Diagnosis present

## 2019-04-04 DIAGNOSIS — I509 Heart failure, unspecified: Secondary | ICD-10-CM

## 2019-04-04 DIAGNOSIS — F259 Schizoaffective disorder, unspecified: Secondary | ICD-10-CM | POA: Diagnosis present

## 2019-04-04 DIAGNOSIS — E860 Dehydration: Secondary | ICD-10-CM | POA: Diagnosis present

## 2019-04-04 DIAGNOSIS — Z91040 Latex allergy status: Secondary | ICD-10-CM

## 2019-04-04 DIAGNOSIS — Z886 Allergy status to analgesic agent status: Secondary | ICD-10-CM

## 2019-04-04 DIAGNOSIS — F1410 Cocaine abuse, uncomplicated: Secondary | ICD-10-CM | POA: Diagnosis present

## 2019-04-04 DIAGNOSIS — J9620 Acute and chronic respiratory failure, unspecified whether with hypoxia or hypercapnia: Secondary | ICD-10-CM | POA: Diagnosis present

## 2019-04-04 DIAGNOSIS — R7989 Other specified abnormal findings of blood chemistry: Secondary | ICD-10-CM

## 2019-04-04 DIAGNOSIS — M79672 Pain in left foot: Secondary | ICD-10-CM

## 2019-04-04 DIAGNOSIS — I5082 Biventricular heart failure: Secondary | ICD-10-CM | POA: Diagnosis present

## 2019-04-04 DIAGNOSIS — J449 Chronic obstructive pulmonary disease, unspecified: Secondary | ICD-10-CM | POA: Diagnosis present

## 2019-04-04 DIAGNOSIS — Z91148 Patient's other noncompliance with medication regimen for other reason: Secondary | ICD-10-CM

## 2019-04-04 DIAGNOSIS — M79671 Pain in right foot: Secondary | ICD-10-CM

## 2019-04-04 DIAGNOSIS — Z91128 Patient's intentional underdosing of medication regimen for other reason: Secondary | ICD-10-CM

## 2019-04-04 DIAGNOSIS — T465X6A Underdosing of other antihypertensive drugs, initial encounter: Secondary | ICD-10-CM | POA: Diagnosis present

## 2019-04-04 DIAGNOSIS — Y92009 Unspecified place in unspecified non-institutional (private) residence as the place of occurrence of the external cause: Secondary | ICD-10-CM

## 2019-04-04 DIAGNOSIS — I110 Hypertensive heart disease with heart failure: Principal | ICD-10-CM | POA: Diagnosis present

## 2019-04-04 DIAGNOSIS — M79604 Pain in right leg: Secondary | ICD-10-CM

## 2019-04-04 DIAGNOSIS — F17210 Nicotine dependence, cigarettes, uncomplicated: Secondary | ICD-10-CM | POA: Diagnosis present

## 2019-04-04 LAB — TROPONIN I
Troponin I: 0.04 ng/mL (ref <0.03)
Troponin I: <0.03 ng/mL (ref <0.03)

## 2019-04-04 LAB — BASIC METABOLIC PANEL
Anion gap: 8 (ref 5–15)
BUN: 14 mg/dL (ref 6–20)
CO2: 23 mmol/L (ref 22–32)
Calcium: 8.8 mg/dL — ABNORMAL LOW (ref 8.9–10.3)
Chloride: 107 mmol/L (ref 98–111)
Creatinine, Ser: 1.24 mg/dL (ref 0.61–1.24)
GFR calc Af Amer: >60 mL/min (ref >60)
GFR calc non Af Amer: >60 mL/min (ref >60)
Glucose, Bld: 137 mg/dL — ABNORMAL HIGH (ref 70–99)
Potassium: 3.9 mmol/L (ref 3.5–5.1)
Sodium: 138 mmol/L (ref 135–145)

## 2019-04-04 LAB — CBC WITH DIFFERENTIAL/PLATELET
Abs Immature Granulocytes: 0.02 10*3/uL (ref 0.00–0.07)
Basophils Absolute: 0.1 10*3/uL (ref 0.0–0.1)
Basophils Relative: 1 %
Eosinophils Absolute: 0.1 10*3/uL (ref 0.0–0.5)
Eosinophils Relative: 1 %
HCT: 40.5 % (ref 39.0–52.0)
Hemoglobin: 13.3 g/dL (ref 13.0–17.0)
Immature Granulocytes: 0 %
Lymphocytes Relative: 33 %
Lymphs Abs: 2.6 10*3/uL (ref 0.7–4.0)
MCH: 32.4 pg (ref 26.0–34.0)
MCHC: 32.8 g/dL (ref 30.0–36.0)
MCV: 98.5 fL (ref 80.0–100.0)
Monocytes Absolute: 1 10*3/uL (ref 0.1–1.0)
Monocytes Relative: 12 %
Neutro Abs: 4.3 10*3/uL (ref 1.7–7.7)
Neutrophils Relative %: 53 %
Platelets: 232 10*3/uL (ref 150–400)
RBC: 4.11 MIL/uL — ABNORMAL LOW (ref 4.22–5.81)
RDW: 16.8 % — ABNORMAL HIGH (ref 11.5–15.5)
WBC: 8 10*3/uL (ref 4.0–10.5)
nRBC: 0 % (ref 0.0–0.2)

## 2019-04-04 LAB — SARS CORONAVIRUS 2 BY RT PCR (HOSPITAL ORDER, PERFORMED IN ~~LOC~~ HOSPITAL LAB): SARS Coronavirus 2: NEGATIVE

## 2019-04-04 LAB — BRAIN NATRIURETIC PEPTIDE: B Natriuretic Peptide: 1260.6 pg/mL — ABNORMAL HIGH (ref 0.0–100.0)

## 2019-04-04 LAB — MAGNESIUM: Magnesium: 1.9 mg/dL (ref 1.7–2.4)

## 2019-04-04 IMAGING — DX PORTABLE CHEST - 1 VIEW
1 series · 1 of 1 positions shown · non-contrast
Comparison: [DATE]

CLINICAL DATA: Shortness of breath

EXAM:
PORTABLE CHEST 1 VIEW

[chest ap]
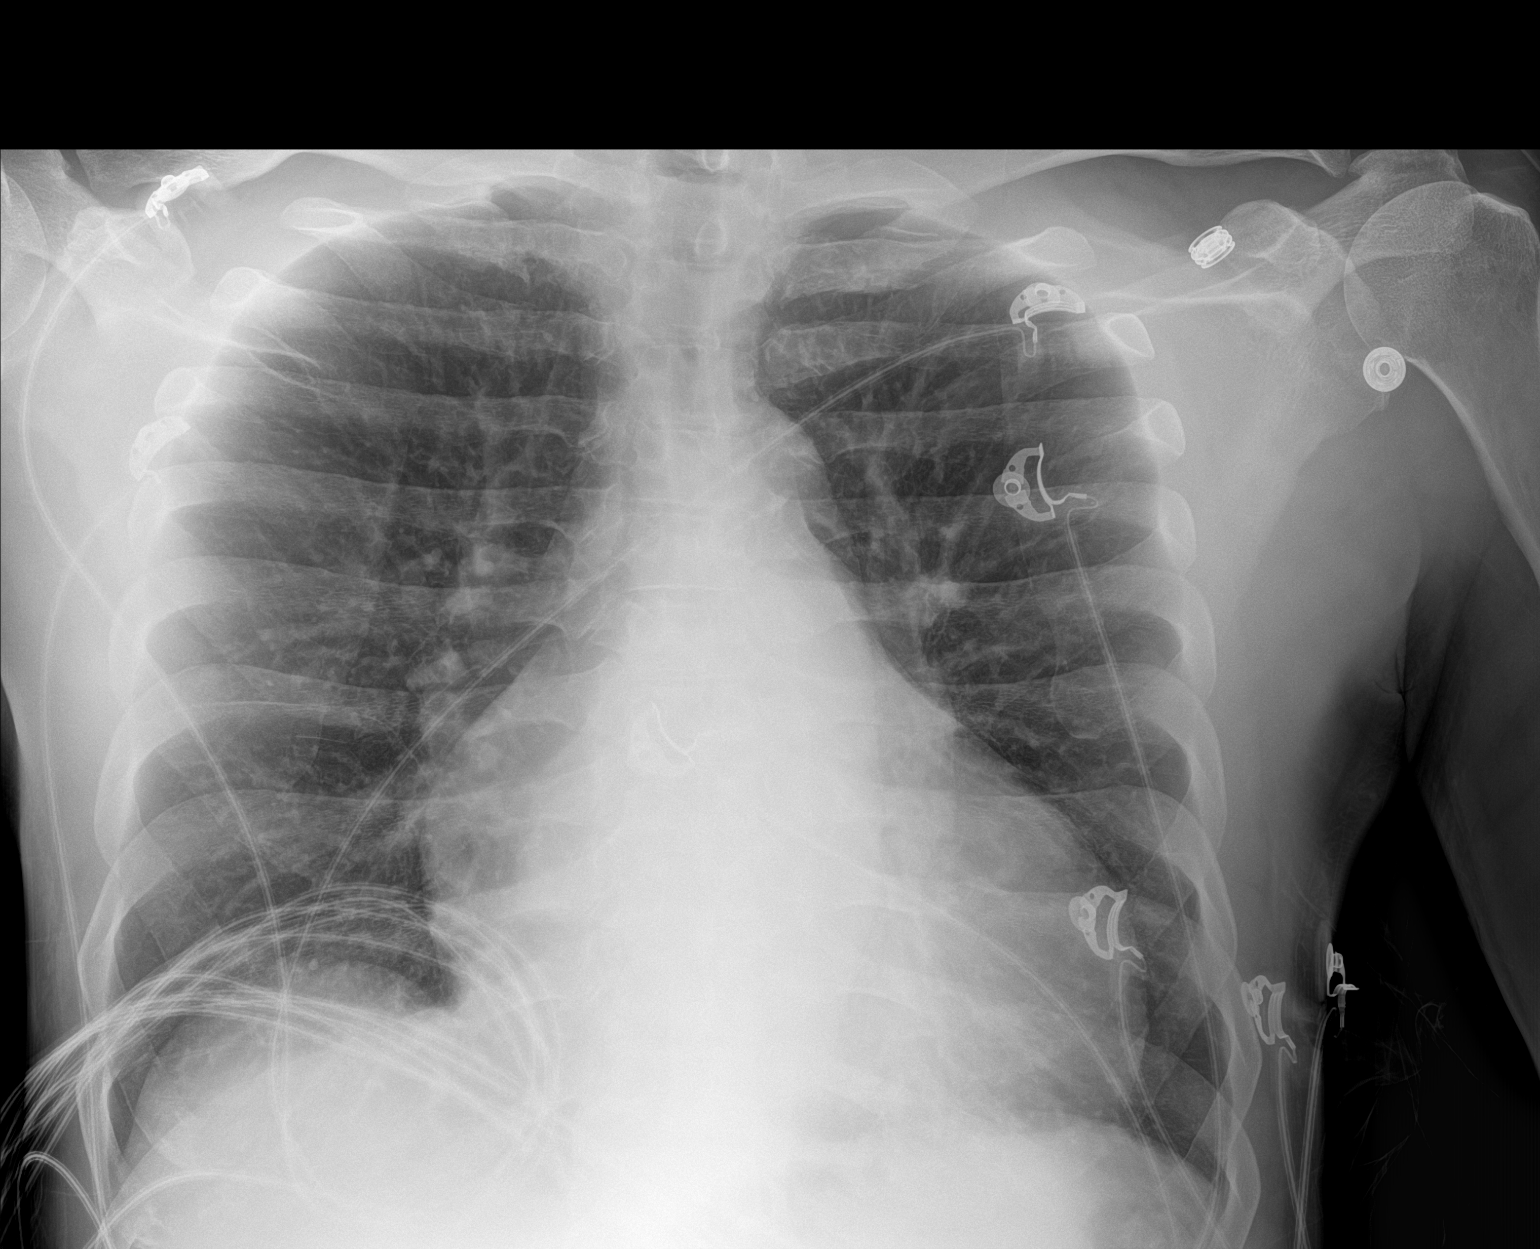

[1 of 1 positions shown; findings below may reference images not displayed]

FINDINGS: Cardiomegaly. Interstitial coarsening has improved from prior and
there is no definitive edema. No effusion, air bronchogram, or
pneumothorax.
IMPRESSION: Cardiomegaly without failure.

## 2019-04-04 MED ORDER — PROMETHAZINE HCL 25 MG PO TABS: 12.5000 mg | ORAL_TABLET | Freq: Four times a day (QID) | ORAL | Status: DC | PRN

## 2019-04-04 MED ORDER — ENOXAPARIN SODIUM 40 MG/0.4ML ~~LOC~~ SOLN
40.0000 mg | SUBCUTANEOUS | Status: DC
  Administered 2019-04-05: 40 mg via SUBCUTANEOUS
  Filled 2019-04-04 (×2): qty 0.4

## 2019-04-04 MED ORDER — IPRATROPIUM BROMIDE 0.02 % IN SOLN: 0.5000 mg | Freq: Four times a day (QID) | RESPIRATORY_TRACT | Status: DC | PRN

## 2019-04-04 MED ORDER — ACETAMINOPHEN 650 MG RE SUPP: 650.0000 mg | Freq: Four times a day (QID) | RECTAL | Status: DC | PRN

## 2019-04-04 MED ORDER — SENNOSIDES-DOCUSATE SODIUM 8.6-50 MG PO TABS: 1.0000 | ORAL_TABLET | Freq: Every evening | ORAL | Status: DC | PRN

## 2019-04-04 MED ORDER — ACETAMINOPHEN 325 MG PO TABS
650.0000 mg | ORAL_TABLET | Freq: Four times a day (QID) | ORAL | Status: DC | PRN
  Filled 2019-04-04: qty 2

## 2019-04-04 MED ORDER — ALBUTEROL SULFATE (2.5 MG/3ML) 0.083% IN NEBU: 2.5000 mg | INHALATION_SOLUTION | RESPIRATORY_TRACT | Status: DC | PRN

## 2019-04-04 MED ORDER — FUROSEMIDE 10 MG/ML IJ SOLN
40.0000 mg | Freq: Two times a day (BID) | INTRAMUSCULAR | Status: DC
  Administered 2019-04-04 – 2019-04-05 (×3): 40 mg via INTRAVENOUS
  Filled 2019-04-04 (×3): qty 4

## 2019-04-04 MED ORDER — MORPHINE SULFATE (PF) 4 MG/ML IV SOLN
4.0000 mg | Freq: Once | INTRAVENOUS | Status: AC
  Administered 2019-04-04: 4 mg via INTRAVENOUS
  Filled 2019-04-04: qty 1

## 2019-04-04 MED ORDER — LISINOPRIL 40 MG PO TABS
40.0000 mg | ORAL_TABLET | Freq: Every day | ORAL | Status: DC
  Administered 2019-04-04 – 2019-04-06 (×3): 40 mg via ORAL
  Filled 2019-04-04 (×3): qty 1

## 2019-04-04 MED ORDER — PREDNISONE 20 MG PO TABS
40.0000 mg | ORAL_TABLET | Freq: Every day | ORAL | Status: DC
  Administered 2019-04-04 – 2019-04-06 (×3): 40 mg via ORAL
  Filled 2019-04-04 (×3): qty 2

## 2019-04-04 MED ORDER — ALBUTEROL SULFATE HFA 108 (90 BASE) MCG/ACT IN AERS: 1.0000 | INHALATION_SPRAY | RESPIRATORY_TRACT | Status: DC | PRN

## 2019-04-04 MED ORDER — GABAPENTIN 300 MG PO CAPS
300.0000 mg | ORAL_CAPSULE | Freq: Every day | ORAL | Status: DC
  Administered 2019-04-04 – 2019-04-06 (×3): 300 mg via ORAL
  Filled 2019-04-04 (×3): qty 1

## 2019-04-04 NOTE — ED Notes (Signed)
Paged attending Per RN

## 2019-04-04 NOTE — Progress Notes (Signed)
   Subjective: Endorsing 9 out of 10 right foot pain. He states that this pain occurs when he gets fluid overloaded and after diuresis, the pain improves. Stated that the foot pain started gradually. He forgot that his meds were sent to [facility] after last hospitalization so he never picked them up. Discussed plan for lasix. Will also treat with prednisone due to wheezing on lung exam and possibility of gout in the right foot. He stated he would stay as long as he needed to this time.   Objective:  Vital signs in last 24 hours: Vitals:   04/04/19 0745 04/04/19 0800 04/04/19 0855 04/04/19 0919  BP: (!) 160/110 (!) 181/116 (!) 166/121 (!) 170/108  Pulse:    (!) 105  Resp: (!) 28 (!) 25 (!) 30 20  Temp:    97.7 F (36.5 C)  TempSrc:    Oral  SpO2: 97%  100% 100%  Weight:    76.6 kg  Height:    5\' 9"  (1.753 m)   Gen: laying in bed, NAD Cardiac: RRR, no m/r/g Pulm: diffuse expiratory wheezing, no crackles, normal wob and saturating well on RA Abd: soft, ND, NT, no edema Ext: bilateral feet swelling R>L. 1+ pitting edema on the right up to the shin. Trace edema on the left.  MSK: Right foot is very TTP with increased warmth, no redness. Decreased ROM in right ankle due to pain.   Assessment/Plan:  Active Problems:   Acute on chronic clinical systolic heart failure [facility])   Mr. Claycamp is a 53 year old male with chronic HFrEF, HTN, COPD, and substance use disorder who presents with shortness of breath, orthopnea, and lower extremity edema was likely secondary to an acute on chronic heart failure exacerbation from medication noncompliance and recent substance abuse. I do question whether he has a component of COPD exacerbation as he has wheezing on exam and productive cough. He has not had PFTs in the past to diagnose COPD.  Non-hypoxic acute on chronic respiratory failure: 2/2 bi-ventricular heart failure and probably obstructive lung disease - IV lasix 40mg  bid, may need to increase to TID  based on previous hospitalizations - strict I/O, daily weight - continue lisinopril, add BB once euvolemic - prednisone 40mg  qd x 5 days - nebs - outpt PFTs - tobacco and cocaine cessation   Foot pain, bilateral: right worse than left. Described as "burning." Notable swelling on exam with increased warmth and decreased rom in the right ankle. Could be 2/2 volume overload but also consider gout given severity of pain  - ice pack - gabapentin - prednisone above   Hypertension:  - continue lisinopril 40 mg daily - hold BB  - d/c clonidine given cocaine use  Substance use disorder: - SW consult   FEN/GI: Heart healthy DVT prophylaxis: enoxaparin, patient has been refusing. He was educated on importance of dvt ppx while inpatient.  CODE STATUS: Full  Dispo: Anticipated discharge in approximately 3 day(s).   Ali Lowe, MD 04/04/2019, 12:20 PM Pager: [phone]

## 2019-04-04 NOTE — Progress Notes (Signed)
Patient arrived from ED to 4E room 4.  Telemetry monitor applied and CCMD notified.  CHG bath done.  Patient oriented to unit and room to include call light and phone.  Will continue to monitor.

## 2019-04-04 NOTE — ED Provider Notes (Signed)
[facility] EMERGENCY DEPARTMENT Provider Note   CSN: [id number] Arrival date & time: 04/04/19  0413    History   Chief Complaint Chief Complaint  Patient presents with   Shortness of Breath    [name] is a 53 y.o. male.   The history is provided by the patient.  He has history of bipolar disorder, hypertension, heart failure with reduced ejection fraction, cocaine abuse and comes in because of difficulty breathing which is been getting progressively worse over the last 36 hours.  Dyspnea is worse if he lays flat.  He is also noted worsening leg swelling and is complaining of burning pain in both of his feet.  He denies chest pain, heaviness, tightness, pressure.  He was supposed to be taking furosemide 40 mg daily, but states that he never got the prescription filled.  He had been in the hospital for exacerbation of heart failure and had multiple prescriptions [facility], but he states he did not know where it was and so he never got any of his prescriptions filled.  He does admit to using cocaine.  Past Medical History:  Diagnosis Date   Asthma    Bipolar 1 disorder (HCC)    Depression    Homelessness    Hypertension    Schizoaffective disorder (HCC)     Patient Active Problem List   Diagnosis Date Noted   Housing situation unstable 03/28/2019   AKI (acute kidney injury) (HCC)    Bilateral lower extremity edema    Acute on chronic HFrEF (heart failure with reduced ejection fraction) (HCC) 03/27/2019   COPD exacerbation (HCC)    Acute exacerbation of CHF (congestive heart failure) (HCC) 03/04/2019   Asthma 12/26/2016   Tobacco use disorder 12/26/2016   Schizophrenia (HCC) 12/26/2016   Cocaine abuse with cocaine-induced mood disorder (HCC) 09/06/2016   Alcohol use disorder, mild, abuse 07/31/2016   Substance induced mood disorder (HCC) 07/30/2016   Schizoaffective disorder, bipolar type (HCC) 05/31/2016    Cocaine use disorder, severe, dependence (HCC) 05/31/2016   Essential hypertension 05/31/2016    No past surgical history on file.      Home Medications    Prior to Admission medications   Medication Sig Start Date End Date Taking? Authorizing Provider  ADVAIR DISKUS 250-50 MCG/DOSE AEPB Inhale 1 puff into the lungs 2 (two) times daily. 07/13/18   [provider]  albuterol (PROVENTIL HFA;VENTOLIN HFA) 108 (90 Base) MCG/ACT inhaler Inhale 1-2 puffs into the lungs every 4 (four) hours as needed for shortness of breath. 08/22/18   Dione Booze, MD  ammonium lactate (LAC-HYDRIN) 12 % lotion Apply 1 application topically 2 (two) times a day. To feet 10/13/18   [provider]  carvedilol (COREG) 6.25 MG tablet Take 1 tablet (6.25 mg total) by mouth 2 (two) times daily with a meal. 03/29/19   Arnetha Courser, MD  cloNIDine (CATAPRES) 0.1 MG tablet Take 1 tablet (0.1 mg total) by mouth daily. 03/29/19   Arnetha Courser, MD  dextromethorphan-guaiFENesin [facility] DM) 30-600 MG 12hr tablet Take 1 tablet by mouth 2 (two) times daily. 03/29/19   Arnetha Courser, MD  furosemide (LASIX) 20 MG tablet Take 2 tablets (40 mg total) by mouth daily. 03/29/19   Arnetha Courser, MD  hydrOXYzine (ATARAX/VISTARIL) 25 MG tablet Take 1 tablet (25 mg total) by mouth 3 (three) times daily as needed for itching or anxiety. 03/29/19   Arnetha Courser, MD  ipratropium (ATROVENT) 0.02 % nebulizer solution  Take 2.5 mLs (0.5 mg total) by nebulization every 6 (six) hours as needed for wheezing or shortness of breath. 03/09/19   Claudean Severance, MD  lisinopril (ZESTRIL) 40 MG tablet Take 1 tablet (40 mg total) by mouth daily. 03/29/19   Rehman, Areeg Dorris Carnes, DO    Family History Family History  Problem Relation Age of Onset   Drug abuse Mother    Suicidality Mother     Social History Social History   Tobacco Use   Smoking status: Current Every Day Smoker    Types: Cigarettes   Smokeless tobacco: Never Used    Substance Use Topics   Alcohol use: Yes    Comment: infrequently   Drug use: Yes    Types: Cocaine     Allergies   Acetaminophen; Peanut butter flavor; and Latex   Review of Systems Review of Systems  All other systems reviewed and are negative.    Physical Exam Updated Vital Signs BP (!) 197/150    Pulse (!) 105    Temp 98.2 F (36.8 C) (Oral)    Resp (!) 25   Physical Exam Vitals signs and nursing note reviewed.    53 year old male, appears dyspneic at rest, but is in no acute distress. Vital signs are significant for elevated blood pressure and heart rate and respiratory rate. Oxygen saturation is 97%, which is normal. Head is normocephalic and atraumatic. PERRLA, EOMI. Oropharynx is clear. Neck is nontender and supple without adenopathy or JVD. Back is nontender and there is no CVA tenderness. Lungs have bibasilar rales with coarse breath sounds but no overt wheezes or rhonchi. Chest is nontender. Heart is tachycardic without murmur. Abdomen is soft, flat, nontender without masses or hepatosplenomegaly and peristalsis is normoactive. Extremities have 3+ edema, full range of motion is present. Skin is warm and dry without rash. Neurologic: Mental status is normal, cranial nerves are intact, there are no motor or sensory deficits.  ED Treatments / Results  Labs (all labs ordered are listed, but only abnormal results are displayed) Labs Reviewed  BASIC METABOLIC PANEL - Abnormal; Notable for the following components:      Result Value   Glucose, Bld 137 (*)    Calcium 8.8 (*)    All other components within normal limits  TROPONIN I - Abnormal; Notable for the following components:   Troponin I 0.04 (*)    All other components within normal limits  CBC WITH DIFFERENTIAL/PLATELET - Abnormal; Notable for the following components:   RBC 4.11 (*)    RDW 16.8 (*)    All other components within normal limits  SARS CORONAVIRUS 2 (HOSPITAL ORDER, PERFORMED IN [facility])  BRAIN NATRIURETIC PEPTIDE    EKG EKG Interpretation  Date/Time:  Monday Apr 04 2019 04:20:15 EDT Ventricular Rate:  108 PR Interval:    QRS Duration: 95 QT Interval:  374 QTC Calculation: 502 R Axis:   35 Text Interpretation:  Sinus tachycardia Biatrial enlargement LVH with secondary repolarization abnormality Prolonged QT interval When compared with ECG of 03/27/2019, Premature ventricular complexes are no longer present Confirmed by Dione Booze ([id number]) on 04/04/2019 4:23:36 AM   Radiology Dg Chest Port 1 View  Result Date: 04/04/2019 CLINICAL DATA:  Shortness of breath EXAM: PORTABLE CHEST 1 VIEW COMPARISON:  03/27/2019 FINDINGS: Cardiomegaly. Interstitial coarsening has improved from prior and there is no definitive edema. No effusion, air bronchogram, or pneumothorax. IMPRESSION: Cardiomegaly without failure. Electronically Signed   By: Marnee Spring  M.D.   On: 04/04/2019 04:52    Procedures Procedures  CRITICAL CARE Performed by: Dione Booze Total critical care time: 45 minutes Critical care time was exclusive of separately billable procedures and treating other patients. Critical care was necessary to treat or prevent imminent or life-threatening deterioration. Critical care was time spent personally by me on the following activities: development of treatment plan with patient and/or surrogate as well as nursing, discussions with consultants, evaluation of patient's response to treatment, examination of patient, obtaining history from patient or surrogate, ordering and performing treatments and interventions, ordering and review of laboratory studies, ordering and review of radiographic studies, pulse oximetry and re-evaluation of patient's condition.  Medications Ordered in ED Medications  enoxaparin (LOVENOX) injection 40 mg (has no administration in time range)  acetaminophen (TYLENOL) tablet 650 mg (has no administration in time range)    Or    acetaminophen (TYLENOL) suppository 650 mg (has no administration in time range)  senna-docusate (Senokot-S) tablet 1 tablet (has no administration in time range)  promethazine (PHENERGAN) tablet 12.5 mg (has no administration in time range)  morphine 4 MG/ML injection 4 mg (4 mg Intravenous Given 04/04/19 0438)     Initial Impression / Assessment and Plan / ED Course  I have reviewed the triage vital signs and the nursing notes.  Pertinent labs & imaging results that were available during my care of the patient were reviewed by me and considered in my medical decision making (see chart for details).  CHF exacerbation mainly due to medication noncompliance.  Cocaine abuse.  Old records are reviewed confirming to recent hospitalizations for heart failure.  He was discharged on April 28 at which time [facility] albuterol inhaler, carvedilol, clonidine, dextromethorphan-guaifenesin, furosemide, hydroxyzine, ipratropium, lisinopril.  Apparently none of these were picked up.  Patient is clearly in heart failure again.  He will be given morphine and furosemide.  Screening labs are obtained as well as chest x-ray.  ECG just shows sinus tachycardia with LVH and mildly prolonged QT interval.  Chest x-ray shows cardiomegaly but no obvious pulmonary edema.  Labs do show mildly elevated troponin which is unchanged from where it had been during previous hospitalization.  Renal function has improved over baseline.  Case is discussed with Dr. Delma Officer of internal medicine teaching service who agrees to admit the patient.  Final Clinical Impressions(s) / ED Diagnoses   Final diagnoses:  Acute on chronic congestive heart failure, unspecified heart failure type (HCC)  Elevated troponin I level  Noncompliance with medication regimen    ED Discharge Orders    None       Dione Booze, MD 04/04/19 [phone]

## 2019-04-04 NOTE — ED Notes (Signed)
Spoke with admitting physician to have pt upgraded to progressive bed.

## 2019-04-04 NOTE — H&P (Addendum)
Date: 04/04/2019               Patient Name:  [name] MRN: [id number]  DOB: 1966/01/15 Age / Sex: 53 y.o., male   PCP: [facility]         Medical Service: Internal Medicine Teaching Service         Attending Physician: Dr. Rogelia Boga, Austin Miles, MD    First Contact: Dr. Petra Kuba Pager: [phone]  Second Contact: Dr. Evelene Croon Pager: [phone]       After Hours (After 5p/  First Contact Pager: [phone]  weekends / holidays): Second Contact Pager: [phone]   Chief Complaint: shortness of breath  History of Present Illness:  [name] is a 53 y.o m with HFrEF (EF 15% on 4/20 ECHO), substance use disorder (cocaine), hypertension, and questionable COPD who presented with shortness of breath.  He was admitted between 4/26 to 4/28 for acute on chronic systolic heart failure exacerbation.  He was treated with IV Lasix with improvement in his shortness of breath and lower extremity swelling.  He left AMA but was instructed to take Lasix, carvedilol, and lisinopril which he did not pick up.  He states that over the last 2-3 days he has had progressively worsening shortness of breath with associated leg swelling, leg pain (righ>left), orthopnea, productive cough with clear sputum.  He is especially concerned about his right leg pain which he says is extremely painful and located on the dorsal aspect of the right foot.  He also reports intermittent nonradiating left-sided chest pain that will last for "1 second".  Last episode of chest pain was approximately 2 days ago.  He states that this is not new has been going on since even before his last hospitalization.  He reports last snorting cocaine 2 days ago.  He denies any other drug use and alcohol use.  He denies palpitations, abdominal pain, change in bowel movements, and urinary symptoms.  In the ED he was found to have elevated blood pressure with SBP between 150s to 190s and tachypnea with respiratory rate from 23-35.  He was  initially placed on a nonrebreather with 15 L but is now currently saturating well on room air.  CBC and BMP unremarkable.  Troponin elevated at 0.04.  BNP pending.   Meds:  No current facility-administered medications on file prior to encounter.    Current Outpatient Medications on File Prior to Encounter  Medication Sig  . ADVAIR DISKUS 250-50 MCG/DOSE AEPB Inhale 1 puff into the lungs 2 (two) times daily.  Marland Kitchen albuterol (PROVENTIL HFA;VENTOLIN HFA) 108 (90 Base) MCG/ACT inhaler Inhale 1-2 puffs into the lungs every 4 (four) hours as needed for shortness of breath.  Marland Kitchen ammonium lactate (LAC-HYDRIN) 12 % lotion Apply 1 application topically 2 (two) times a day. To feet  . carvedilol (COREG) 6.25 MG tablet Take 1 tablet (6.25 mg total) by mouth 2 (two) times daily with a meal.  . cloNIDine (CATAPRES) 0.1 MG tablet Take 1 tablet (0.1 mg total) by mouth daily.  Marland Kitchen dextromethorphan-guaiFENesin (MUCINEX DM) 30-600 MG 12hr tablet Take 1 tablet by mouth 2 (two) times daily.  . furosemide (LASIX) 20 MG tablet Take 2 tablets (40 mg total) by mouth daily.  . hydrOXYzine (ATARAX/VISTARIL) 25 MG tablet Take 1 tablet (25 mg total) by mouth 3 (three) times daily as needed for itching or anxiety.  Marland Kitchen ipratropium (ATROVENT) 0.02 % nebulizer solution Take 2.5 mLs (0.5 mg total) by nebulization every 6 (  six) hours as needed for wheezing or shortness of breath.  . lisinopril (ZESTRIL) 40 MG tablet Take 1 tablet (40 mg total) by mouth daily.    Allergies: Allergies as of 04/04/2019 - Review Complete 04/04/2019  Allergen Reaction Noted  . Acetaminophen Hives, Nausea And Vomiting, and Rash 08/10/2015  . Peanut butter flavor Itching and Nausea And Vomiting 01/07/2018  . Latex Rash 03/04/2019   Past Medical History:  Diagnosis Date  . Asthma   . Bipolar 1 disorder (HCC)   . Depression   . Homelessness   . Hypertension   . Schizoaffective disorder (HCC)     Family History:  Family History  Problem Relation  Age of Onset  . Drug abuse Mother   . Suicidality Mother     Social History: He is currently living with a friend.  He continues to use cocaine (last snorted 2 days ago).  He denies any other illicit drug use or alcohol use.  Review of Systems: A complete ROS was negative except as per HPI.   Physical Exam: Blood pressure (!) 165/105, pulse (!) 105, temperature 98.2 F (36.8 C), temperature source Oral, resp. rate (!) 35. General: Lying in bed in no acute distress HEENT: Normocephalic and atraumatic, EOMI Cardiovascular: Tachycardic, regular rhythm Respiratory: Wheezes present.  Crackles heard at the lung bases bilaterally, tachypneic, normal oxygen saturations on room air Abdominal: Normoactive bowel sounds, soft, nontender to palpation MSK: 2 pitting edema of the lower extremities bilaterally, tenderness to palpation of the dorsal aspect of the right foot, no warmth or erythema noted Skin: Warm and dry Psych: Normal behavior, mood, affect  EKG: personally reviewed my interpretation is is tachycardia, QT prolongation (Qtc 502), left ventricular hypertrophy.  CXR: personally reviewed my interpretation is stable cardiomegaly, clear lung fields, no pleural effusions, no bony abnormalities  Assessment & Plan by Problem: Active Problems:   Acute on chronic heart failure [facility])  [name] is a 53 year old male with chronic HFrEF, HTN, COPD, and substance use disorder who presents with shortness of breath, orthopnea, and lower extremity edema was likely secondary to an acute on chronic heart failure exacerbation from medication noncompliance and recent substance abuse. I do question whether he has a component of COPD exacerbation as he has wheezing on exam and productive cough. He has not had PFTs in the past to diagnose COPD.  Acute on chronic HFrEF: He was given Lasix 40 mg TID during his last admission with good urinary output. - Lasix 40 mg twice daily - Strict I's and O's - Daily  weights - Continuous cardiac monitoring  Hypertension: Blood pressure elevated throughout hospital stay. - Restart lisinopril 40 mg daily  COPD:  - Albuterol every 4 hours PRN wheezing - Atrovent every 6 hours PRN wheezing  Substance use disorder: - Recommend complete cessation.   FEN/GI: Heart healthy DVT prophylaxis enoxaparin CODE STATUS: Full  Dispo: Admit patient to Observation with expected length of stay less than 2 midnights.  Signed: Synetta Shadow, MD 04/04/2019, 5:46 AM  Pager: Pager: [phone]

## 2019-04-04 NOTE — Social Work (Signed)
CSW unable to enroll pt in insurance plan, will alert Financial Counseling to screen for Medicaid.   CSW signing off. Please consult if any additional needs arise.  Doy Hutching, LCSWA [facility] Clinical Social Work [phone]

## 2019-04-04 NOTE — ED Notes (Signed)
Pt request to delay transport while he voids.

## 2019-04-04 NOTE — ED Notes (Signed)
Pt sleeping but rouses as this nurse enters room. Alert and oriented to per place and situation. Discusses medication.

## 2019-04-04 NOTE — ED Triage Notes (Signed)
Per ems pt is from home and was having SOB and severe swelling in his feet and abdomen. Very distended and tight. Rales in both lungs. Pt has had no Lasix at home for 1 week or more. Pt was here on 03/27/19 and left AMA. Pt is hypertensive 168/100, placed him on a non rebreather with 15L. Pt was given 2 nitro for pressures.

## 2019-04-04 NOTE — ED Notes (Signed)
ED TO INPATIENT HANDOFF REPORT  ED Nurse Name and Phone #: Erin Fulling [phone] S Name/Age/Gender [name] 53 y.o. male Room/Bed: 025C/025C  Code Status   Code Status: Full Code  Home/SNF/Other Home Patient oriented to: self, place, time and situation Is this baseline? Yes   Triage Complete: Triage complete  Chief Complaint SOB; Hx of CHF  Triage Note Per ems pt is from home and was having SOB and severe swelling in his feet and abdomen. Very distended and tight. Rales in both lungs. Pt has had no Lasix at home for 1 week or more. Pt was here on 03/27/19 and left AMA. Pt is hypertensive 168/100, placed him on a non rebreather with 15L. Pt was given 2 nitro for pressures.    Allergies Allergies  Allergen Reactions  . Acetaminophen Hives, Nausea And Vomiting and Rash    Pt overdosed on bottle of APAP 05/06/16   . Peanut Butter Flavor Itching and Nausea And Vomiting    Patient doesn't recognize, but it's been in Epic since 2019 (leaving it)  . Latex Rash    Level of Care/Admitting Diagnosis ED Disposition    ED Disposition Condition Comment   Admit  Hospital Area: [facility] [[id number]]  Level of Care: Telemetry Cardiac [103]  Covid Evaluation: N/A  Diagnosis: Acute on chronic heart failure [facility]) [[id number]]  Admitting Physician: Erenest Blank  Attending Physician: Blanch Media A [[id number]  PT Class (Do Not Modify): Observation [104]  PT Acc Code (Do Not Modify): Observation [10022]       B Medical/Surgery History Past Medical History:  Diagnosis Date  . Asthma   . Bipolar 1 disorder (HCC)   . Depression   . Homelessness   . Hypertension   . Schizoaffective disorder (HCC)    No past surgical history on file.   A IV Location/Drains/Wounds Patient Lines/Drains/Airways Status   Active Line/Drains/Airways    Name:   Placement date:   Placement time:   Site:   Days:   Peripheral IV 04/04/19 Left Forearm   04/04/19    0429     Forearm   less than 1          Intake/Output Last 24 hours No intake or output data in the 24 hours ending 04/04/19 0620  Labs/Imaging Results for orders placed or performed during the hospital encounter of 04/04/19 (from the past 48 hour(s))  Basic metabolic panel     Status: Abnormal   Collection Time: 04/04/19  4:30 AM  Result Value Ref Range   Sodium 138 135 - 145 mmol/L   Potassium 3.9 3.5 - 5.1 mmol/L   Chloride 107 98 - 111 mmol/L   CO2 23 22 - 32 mmol/L   Glucose, Bld 137 (H) 70 - 99 mg/dL   BUN 14 6 - 20 mg/dL   Creatinine, Ser 4.12 0.61 - 1.24 mg/dL   Calcium 8.8 (L) 8.9 - 10.3 mg/dL   GFR calc non Af Amer >60 >60 mL/min   GFR calc Af Amer >60 >60 mL/min   Anion gap 8 5 - 15    Comment: Performed at [facility], [address] [place], [name] [address]  Troponin I - Once     Status: Abnormal   Collection Time: 04/04/19  4:30 AM  Result Value Ref Range   Troponin I 0.04 (HH) <0.03 ng/mL    Comment: CRITICAL RESULT CALLED TO, READ BACK BY AND VERIFIED WITH: Elyn Krogh A,RN 04/04/19 0529 WAYK  Performed at [facility], [address], [place], [name] [address]   CBC with Differential     Status: Abnormal   Collection Time: 04/04/19  4:30 AM  Result Value Ref Range   WBC 8.0 4.0 - 10.5 K/uL   RBC 4.11 (L) 4.22 - 5.81 MIL/uL   Hemoglobin 13.3 13.0 - 17.0 g/dL   HCT 60.4 54.0 - 98.1 %   MCV 98.5 80.0 - 100.0 fL   MCH 32.4 26.0 - 34.0 pg   MCHC 32.8 30.0 - 36.0 g/dL   RDW 19.1 (H) 47.8 - 29.5 %   Platelets 232 150 - 400 K/uL   nRBC 0.0 0.0 - 0.2 %   Neutrophils Relative % 53 %   Neutro Abs 4.3 1.7 - 7.7 K/uL   Lymphocytes Relative 33 %   Lymphs Abs 2.6 0.7 - 4.0 K/uL   Monocytes Relative 12 %   Monocytes Absolute 1.0 0.1 - 1.0 K/uL   Eosinophils Relative 1 %   Eosinophils Absolute 0.1 0.0 - 0.5 K/uL   Basophils Relative 1 %   Basophils Absolute 0.1 0.0 - 0.1 K/uL   Immature Granulocytes 0 %   Abs Immature Granulocytes 0.02 0.00 - 0.07 K/uL     Comment: Performed at [facility], [address] [place], [name] [address]  SARS Coronavirus 2 [facility] order, Performed in [facility] hospital lab)     Status: None   Collection Time: 04/04/19  4:42 AM  Result Value Ref Range   SARS Coronavirus 2 NEGATIVE NEGATIVE    Comment: (NOTE) If result is NEGATIVE SARS-CoV-2 target nucleic acids are NOT DETECTED. The SARS-CoV-2 RNA is generally detectable in upper and lower  respiratory specimens during the acute phase of infection. The lowest  concentration of SARS-CoV-2 viral copies this assay can detect is 250  copies / mL. A negative result does not preclude SARS-CoV-2 infection  and should not be used as the sole basis for treatment or other  patient management decisions.  A negative result may occur with  improper specimen collection / handling, submission of specimen other  than nasopharyngeal swab, presence of viral mutation(s) within the  areas targeted by this assay, and inadequate number of viral copies  (<250 copies / mL). A negative result must be combined with clinical  observations, patient history, and epidemiological information. If result is POSITIVE SARS-CoV-2 target nucleic acids are DETECTED. The SARS-CoV-2 RNA is generally detectable in upper and lower  respiratory specimens dur ing the acute phase of infection.  Positive  results are indicative of active infection with SARS-CoV-2.  Clinical  correlation with patient history and other diagnostic information is  necessary to determine patient infection status.  Positive results do  not rule out bacterial infection or co-infection with other viruses. If result is PRESUMPTIVE POSTIVE SARS-CoV-2 nucleic acids MAY BE PRESENT.   A presumptive positive result was obtained on the submitted specimen  and confirmed on repeat testing.  While 2019 novel coronavirus  (SARS-CoV-2) nucleic acids may be present in the submitted sample  additional confirmatory testing may be  necessary for epidemiological  and / or clinical management purposes  to differentiate between  SARS-CoV-2 and other Sarbecovirus currently known to infect humans.  If clinically indicated additional testing with an alternate test  methodology [phone]) is advised. The SARS-CoV-2 RNA is generally  detectable in upper and lower respiratory sp ecimens during the acute  phase of infection. The expected result is Negative. Fact Sheet for  Patients:  BoilerBrush.com.cy Fact Sheet for Healthcare Providers: https://pope.com/ This test is not yet approved or cleared by the [place] FDA and has been authorized for detection and/or diagnosis of SARS-CoV-2 by FDA under an Emergency Use Authorization (EUA).  This EUA will remain in effect (meaning this test can be used) for the duration of the COVID-19 declaration under Section 564(b)(1) of the Act, 21 U.S.C. section 360bbb-3(b)(1), unless the authorization is terminated or revoked sooner. Performed at [facility], [address] [place], [name] [address]    Dg Chest Port 1 View  Result Date: 04/04/2019 CLINICAL DATA:  Shortness of breath EXAM: PORTABLE CHEST 1 VIEW COMPARISON:  03/27/2019 FINDINGS: Cardiomegaly. Interstitial coarsening has improved from prior and there is no definitive edema. No effusion, air bronchogram, or pneumothorax. IMPRESSION: Cardiomegaly without failure. Electronically Signed   By: Marnee Spring M.D.   On: 04/04/2019 04:52    Pending Labs Unresulted Labs (From admission, onward)    Start     Ordered   04/05/19 0500  Basic metabolic panel  Tomorrow morning,   R     04/04/19 0555   04/05/19 0500  CBC  Tomorrow morning,   R     04/04/19 0555   04/04/19 0431  Brain natriuretic peptide  Once,   STAT     04/04/19 0433          Vitals/Pain Today's Vitals   04/04/19 0423 04/04/19 0427 04/04/19 0431 04/04/19 0545  BP:   (!) 165/105 (!) 197/150  Pulse:       Resp:   (!) 35 (!) 25  Temp:      TempSrc:      PainSc: 7  9       Isolation Precautions No active isolations  Medications Medications  enoxaparin (LOVENOX) injection 40 mg (has no administration in time range)  acetaminophen (TYLENOL) tablet 650 mg (has no administration in time range)    Or  acetaminophen (TYLENOL) suppository 650 mg (has no administration in time range)  senna-docusate (Senokot-S) tablet 1 tablet (has no administration in time range)  promethazine (PHENERGAN) tablet 12.5 mg (has no administration in time range)  ipratropium (ATROVENT) nebulizer solution 0.5 mg (has no administration in time range)  lisinopril (ZESTRIL) tablet 40 mg (has no administration in time range)  albuterol (VENTOLIN HFA) 108 (90 Base) MCG/ACT inhaler 1-2 puff (has no administration in time range)  furosemide (LASIX) injection 40 mg (has no administration in time range)  morphine 4 MG/ML injection 4 mg (4 mg Intravenous Given 04/04/19 0438)    Mobility walks Low fall risk   Focused Assessments Pulmonary Assessment Handoff:  Lung sounds: Bilateral Breath Sounds: Rales L Breath Sounds: Rales R Breath Sounds: Rales O2 Device: NRB O2 Flow Rate (L/min): 15 L/min      R Recommendations: See Admitting Provider Note  Report given to:   Additional Notes: Pt is alert oriented x 4

## 2019-04-05 DIAGNOSIS — T465X6A Underdosing of other antihypertensive drugs, initial encounter: Secondary | ICD-10-CM | POA: Diagnosis present

## 2019-04-05 DIAGNOSIS — I5082 Biventricular heart failure: Secondary | ICD-10-CM | POA: Diagnosis present

## 2019-04-05 DIAGNOSIS — R451 Restlessness and agitation: Secondary | ICD-10-CM

## 2019-04-05 DIAGNOSIS — M79671 Pain in right foot: Secondary | ICD-10-CM

## 2019-04-05 DIAGNOSIS — F259 Schizoaffective disorder, unspecified: Secondary | ICD-10-CM | POA: Diagnosis present

## 2019-04-05 DIAGNOSIS — J449 Chronic obstructive pulmonary disease, unspecified: Secondary | ICD-10-CM | POA: Diagnosis present

## 2019-04-05 DIAGNOSIS — Z91010 Allergy to peanuts: Secondary | ICD-10-CM | POA: Diagnosis not present

## 2019-04-05 DIAGNOSIS — Z720 Tobacco use: Secondary | ICD-10-CM

## 2019-04-05 DIAGNOSIS — M79672 Pain in left foot: Secondary | ICD-10-CM

## 2019-04-05 DIAGNOSIS — I110 Hypertensive heart disease with heart failure: Secondary | ICD-10-CM | POA: Diagnosis not present

## 2019-04-05 DIAGNOSIS — E860 Dehydration: Secondary | ICD-10-CM | POA: Diagnosis present

## 2019-04-05 DIAGNOSIS — Z79899 Other long term (current) drug therapy: Secondary | ICD-10-CM | POA: Diagnosis not present

## 2019-04-05 DIAGNOSIS — F1410 Cocaine abuse, uncomplicated: Secondary | ICD-10-CM | POA: Diagnosis present

## 2019-04-05 DIAGNOSIS — Z9114 Patient's other noncompliance with medication regimen: Secondary | ICD-10-CM | POA: Diagnosis not present

## 2019-04-05 DIAGNOSIS — Z91040 Latex allergy status: Secondary | ICD-10-CM | POA: Diagnosis not present

## 2019-04-05 DIAGNOSIS — J9620 Acute and chronic respiratory failure, unspecified whether with hypoxia or hypercapnia: Secondary | ICD-10-CM | POA: Diagnosis present

## 2019-04-05 DIAGNOSIS — F319 Bipolar disorder, unspecified: Secondary | ICD-10-CM | POA: Diagnosis present

## 2019-04-05 DIAGNOSIS — F17210 Nicotine dependence, cigarettes, uncomplicated: Secondary | ICD-10-CM | POA: Diagnosis present

## 2019-04-05 DIAGNOSIS — Z91128 Patient's intentional underdosing of medication regimen for other reason: Secondary | ICD-10-CM | POA: Diagnosis not present

## 2019-04-05 DIAGNOSIS — Z813 Family history of other psychoactive substance abuse and dependence: Secondary | ICD-10-CM | POA: Diagnosis not present

## 2019-04-05 DIAGNOSIS — Z886 Allergy status to analgesic agent status: Secondary | ICD-10-CM | POA: Diagnosis not present

## 2019-04-05 DIAGNOSIS — Y92009 Unspecified place in unspecified non-institutional (private) residence as the place of occurrence of the external cause: Secondary | ICD-10-CM | POA: Diagnosis not present

## 2019-04-05 DIAGNOSIS — M79604 Pain in right leg: Secondary | ICD-10-CM

## 2019-04-05 DIAGNOSIS — I5023 Acute on chronic systolic (congestive) heart failure: Secondary | ICD-10-CM | POA: Diagnosis present

## 2019-04-05 DIAGNOSIS — Z20828 Contact with and (suspected) exposure to other viral communicable diseases: Secondary | ICD-10-CM | POA: Diagnosis present

## 2019-04-05 DIAGNOSIS — G629 Polyneuropathy, unspecified: Secondary | ICD-10-CM | POA: Diagnosis present

## 2019-04-05 LAB — BASIC METABOLIC PANEL
Anion gap: 10 (ref 5–15)
BUN: 21 mg/dL — ABNORMAL HIGH (ref 6–20)
CO2: 27 mmol/L (ref 22–32)
Calcium: 9.2 mg/dL (ref 8.9–10.3)
Chloride: 103 mmol/L (ref 98–111)
Creatinine, Ser: 1.43 mg/dL — ABNORMAL HIGH (ref 0.61–1.24)
GFR calc Af Amer: >60 mL/min (ref >60)
GFR calc non Af Amer: 57 mL/min — ABNORMAL LOW (ref >60)
Glucose, Bld: 149 mg/dL — ABNORMAL HIGH (ref 70–99)
Potassium: 3.7 mmol/L (ref 3.5–5.1)
Sodium: 140 mmol/L (ref 135–145)

## 2019-04-05 LAB — CBC
HCT: 45.8 % (ref 39.0–52.0)
Hemoglobin: 15.3 g/dL (ref 13.0–17.0)
MCH: 32.5 pg (ref 26.0–34.0)
MCHC: 33.4 g/dL (ref 30.0–36.0)
MCV: 97.2 fL (ref 80.0–100.0)
Platelets: 255 10*3/uL (ref 150–400)
RBC: 4.71 MIL/uL (ref 4.22–5.81)
RDW: 16.2 % — ABNORMAL HIGH (ref 11.5–15.5)
WBC: 14.3 10*3/uL — ABNORMAL HIGH (ref 4.0–10.5)
nRBC: 0 % (ref 0.0–0.2)

## 2019-04-05 LAB — HEMOGLOBIN A1C
Hgb A1c MFr Bld: 6.1 % — ABNORMAL HIGH (ref 4.8–5.6)
Mean Plasma Glucose: 128.37 mg/dL

## 2019-04-05 LAB — MAGNESIUM: Magnesium: 1.9 mg/dL (ref 1.7–2.4)

## 2019-04-05 LAB — TSH: TSH: 0.973 u[IU]/mL (ref 0.350–4.500)

## 2019-04-05 MED ORDER — AMLODIPINE BESYLATE 10 MG PO TABS
10.0000 mg | ORAL_TABLET | Freq: Every day | ORAL | Status: DC
  Administered 2019-04-05: 10 mg via ORAL
  Filled 2019-04-05 (×2): qty 1

## 2019-04-05 NOTE — Progress Notes (Signed)
Patient refusing assessment of lower extremities by this RN at this time. This RN was allowed to visualize legs  prior to exiting room. Will monitor patient. Naeema Patlan, Randall An rN

## 2019-04-05 NOTE — Progress Notes (Signed)
Patient has indicated at shift change that he refuses all care by night shift staff, including assessments, vital signs and medication administration. Patient refused bedside report and did not want to know or be introduced to night shift staff. This RN will monitor patient's heart rate from monitors outside the patient's room. Will respond to call bell and patient's needs if are made known to staff.

## 2019-04-05 NOTE — Plan of Care (Addendum)
Care Plan has been reviewed:   Problem: Education, coping with anxiety, health and behavior,       Pt has been very irritable , agitated, attacked aggressive verbally, using curse words toward staff nurses, non compliant and refused staff nurse to approach for any nursing care , refused physical exam, checking vital signs and medicine. Pt stated he will call police if staff nurses try to come close to him.  Goal: Knowledge of General Education information will improve, level of anxiety will decrease Outcome: Not Progressing: I called and reported to a night shift in house nursing cordinative administrator  ,Mr. [name], to made him aware because Pt had a history of leaving against medical advise a week ago. ( This information was from RN day shift, Victorino Dike reported. Pt only allowed Ms. Nanette, NT to enter his room. He multiple times asked Ms. Nanette for food and drink and he refused to follow fluid restriction < 1200 ml/day plan.   Problem: Clinical Measurements: EKG was sinus tachycardia to NSR on monitor, HR 95-110. BP 141/104 mmHg. No labor breathing, RR 24, no chest pain, no distress had seen this time.  Goal: Cardiovascular complication will be avoided, respiratory complications will improve Outcome: Progressing: Hemodynamic stable. Will continue to monitor.    Filiberto Pinks, BSN, RN PCCN-CMC,CSC

## 2019-04-05 NOTE — Progress Notes (Signed)
   Subjective: Overnight, Mr. Honan was irritable, agitated, and verbally aggressive to nursing staff. He refused vital signs and medicines and threatened to call the police if nurses come close to him. He would only allow the nurse tech to enter his room to give food and drink.   This morning, we discussed that we will change his orders so he does not require any routine vital signs overnight. He was pleased that his feet swelling has dramatically improved. Continues to have some burning pain in his feet but only with ambulation. Discussed plan to work up his neuropathy with additional blood work. Continue IV diuresis today. Will also change diet to regular.   Objective:  Vital signs in last 24 hours: Vitals:   04/04/19 1456 04/04/19 2030 04/05/19 0331 04/05/19 0832  BP: (!) 154/113 (!) 141/104 (!) 154/111 (!) 161/130  Pulse: 93 95 (!) 106 (!) 31  Resp: (!) 25 20 (!) 21 20  Temp:  97.7 F (36.5 C)    TempSrc:  Oral    SpO2: 95% 97% 98%   Weight:      Height:       Gen: laying in bed, NAD Cardiac: RRR, no m/r/g Pulm: CTAB, mildly increased wob with exertion Ext: no [name], bilateral feet swelling has resolved MSK: bilateral feet slightly TTP, ROM has improved, no redness or warmth  Assessment/Plan:  Active Problems:   Acute on chronic clinical systolic heart failure [facility])   Mr. Deras is a 53 year old male with chronic HFrEF, HTN, COPD, and substance use disorder who presents with shortness of breath, orthopnea, and lower extremity edema was likely secondary to an acute on chronic heart failure exacerbation from medication noncompliance and recent substance abuse. I do question whether he has a component of COPD exacerbation as he has wheezing on exam and productive cough. He has not had PFTs in the past to diagnose COPD.  Non-hypoxic acute on chronic respiratory failure: 2/2 bi-ventricular heart failure and probably obstructive lung disease - 5.7L urine, net neg 4.2L. Not  adhering to dietary restrictions. However, given his h/o leaving AMA prior to being adequately diuresed, we will do what we can to appease him in the hospital and can increase lasix as needed to compensate for his increased po intake. - creatine and bun increased today, co2 23 to 27. Will decrease IV lasix to once daily today and consider transitioning to po tomorrow. - strict I/O, daily weight - continue lisinopril, add BB once euvolemic - prednisone 40mg  qd x 5 days (5/4-5/8) - nebs - outpt PFTs - tobacco and cocaine cessation   Foot pain, bilateral: right worse than left. Described as "burning." Notable swelling on exam with increased warmth and decreased rom in the right ankle. Could be 2/2 volume overload but also consider gout given severity of pain  - ice pack - gabapentin - prednisone above   Hypertension: uncontrolled despite starting lisinopril yesterday. Will continue to monitor with diuresis.  - add amlodipine 10mg    - continue lisinopril 40 mg daily - hold BB  - d/c clonidine given cocaine use    FEN/GI: Regular  DVT prophylaxis: enoxaparin CODE STATUS: Full  Dispo: Anticipated discharge in approximately 2 day(s).   Ali Lowe, MD 04/05/2019, 9:10 AM Pager: [phone]

## 2019-04-06 LAB — HIV ANTIBODY (ROUTINE TESTING W REFLEX): HIV Screen 4th Generation wRfx: NONREACTIVE

## 2019-04-06 LAB — RPR, QUANT+TP ABS (REFLEX)
Rapid Plasma Reagin, Quant: 1:1 {titer} — ABNORMAL HIGH
T Pallidum Abs: REACTIVE — AB

## 2019-04-06 LAB — BASIC METABOLIC PANEL
Anion gap: 10 (ref 5–15)
BUN: 21 mg/dL — ABNORMAL HIGH (ref 6–20)
CO2: 25 mmol/L (ref 22–32)
Calcium: 8.8 mg/dL — ABNORMAL LOW (ref 8.9–10.3)
Chloride: 107 mmol/L (ref 98–111)
Creatinine, Ser: 1.59 mg/dL — ABNORMAL HIGH (ref 0.61–1.24)
GFR calc Af Amer: 58 mL/min — ABNORMAL LOW (ref >60)
GFR calc non Af Amer: 50 mL/min — ABNORMAL LOW (ref >60)
Glucose, Bld: 147 mg/dL — ABNORMAL HIGH (ref 70–99)
Potassium: 3.6 mmol/L (ref 3.5–5.1)
Sodium: 142 mmol/L (ref 135–145)

## 2019-04-06 LAB — RPR: RPR Ser Ql: REACTIVE — AB

## 2019-04-06 MED ORDER — LISINOPRIL 40 MG PO TABS: 40.0000 mg | ORAL_TABLET | Freq: Every day | ORAL | 0 refills | Status: DC

## 2019-04-06 MED ORDER — AMLODIPINE BESYLATE 10 MG PO TABS: 10.0000 mg | ORAL_TABLET | Freq: Every day | ORAL | 0 refills | Status: DC

## 2019-04-06 MED ORDER — IBUPROFEN 400 MG PO TABS
800.0000 mg | ORAL_TABLET | Freq: Three times a day (TID) | ORAL | Status: DC | PRN
  Filled 2019-04-06: qty 2

## 2019-04-06 MED ORDER — FUROSEMIDE 40 MG PO TABS: 40.0000 mg | ORAL_TABLET | Freq: Every day | ORAL | 0 refills | Status: DC

## 2019-04-06 MED ORDER — PREDNISONE 20 MG PO TABS: 40.0000 mg | ORAL_TABLET | Freq: Every day | ORAL | 0 refills | Status: DC

## 2019-04-06 MED ORDER — METOPROLOL SUCCINATE ER 25 MG PO TB24: 25.0000 mg | ORAL_TABLET | Freq: Every day | ORAL | 0 refills | Status: DC

## 2019-04-06 MED ORDER — IBUPROFEN 200 MG PO TABS: 200.0000 mg | ORAL_TABLET | Freq: Once | ORAL | Status: DC

## 2019-04-06 MED ORDER — METOPROLOL SUCCINATE ER 25 MG PO TB24
25.0000 mg | ORAL_TABLET | Freq: Every day | ORAL | Status: DC
  Filled 2019-04-06: qty 1

## 2019-04-06 NOTE — Progress Notes (Signed)
Patient requested pain medicine, advised only tylenol available, patient says he is allergic to tylenol but he'll take it anyway. Once brought patient brought he refused became agitated, cursing and threatening if I don't leave him alone.

## 2019-04-06 NOTE — Discharge Summary (Signed)
Name: [name] MRN: [id number] DOB: 17-Dec-1965 53 y.o. PCP: Medicine, [facility]  Date of Admission: 04/04/2019  4:13 AM Date of Discharge: 04/06/2019 Attending Physician: Dr. Rogelia Boga  Discharge Diagnosis: 1. Acute on chronic heart failure exacerbation 2. HTN  Discharge Medications: Allergies as of 04/06/2019      Reactions   Acetaminophen Hives, Nausea And Vomiting, Rash   Pt overdosed on bottle of APAP 05/06/16   Peanut Butter Flavor Itching, Nausea And Vomiting   Patient doesn't recognize, but it's been in Epic since 2019 (leaving it)   Latex Rash      Medication List    STOP taking these medications   carvedilol 6.25 MG tablet Commonly known as:  Coreg   cloNIDine 0.1 MG tablet Commonly known as:  CATAPRES     TAKE these medications   Advair Diskus 250-50 MCG/DOSE Aepb Generic drug:  Fluticasone-Salmeterol Inhale 1 puff into the lungs 2 (two) times daily.   albuterol 108 (90 Base) MCG/ACT inhaler Commonly known as:  VENTOLIN HFA Inhale 1-2 puffs into the lungs every 4 (four) hours as needed for shortness of breath.   amLODipine 10 MG tablet Commonly known as:  NORVASC Take 1 tablet (10 mg total) by mouth daily.   ammonium lactate 12 % lotion Commonly known as:  LAC-HYDRIN Apply 1 application topically 2 (two) times a day. To feet   dextromethorphan-guaiFENesin 30-600 MG 12hr tablet Commonly known as:  MUCINEX DM Take 1 tablet by mouth 2 (two) times daily.   furosemide 40 MG tablet Commonly known as:  LASIX Take 1 tablet (40 mg total) by mouth daily. What changed:  medication strength   hydrOXYzine 25 MG tablet Commonly known as:  ATARAX/VISTARIL Take 1 tablet (25 mg total) by mouth 3 (three) times daily as needed for itching or anxiety.   ipratropium 0.02 % nebulizer solution Commonly known as:  ATROVENT Take 2.5 mLs (0.5 mg total) by nebulization every 6 (six) hours as needed for wheezing or shortness of breath.   lisinopril 40 MG tablet  Commonly known as:  ZESTRIL Take 1 tablet (40 mg total) by mouth daily.   metoprolol succinate 25 MG 24 hr tablet Commonly known as:  TOPROL-XL Take 1 tablet (25 mg total) by mouth daily.   predniSONE 20 MG tablet Commonly known as:  DELTASONE Take 2 tablets (40 mg total) by mouth daily with breakfast. Start taking on:  Apr 07, 2019       Disposition and follow-up:   Mr.Kahiau Nabarro was discharged from [facility] in Stable condition.  At the hospital follow up visit please address:  Acute on chronic heart failure exacerbation  - Medications were sent to [facility] pharmacy for bedside delivery but patient was not able to afford medications and he left before we could arrange financial assistance.   - recommend switching coreg to metoprolol xl 25mg  qd in order to simplify regiment   - instructed to hold home lasix for two days and resume lasix 40mg  qd on Friday       04/08/19  - dry weight on discharge 83.5kg  HTN:  - uncontrolled   - recommend adding amlodipine 10mg  qd  - discontinue clonidine  - continue lisinopril 40mg  qd    2.  Labs / imaging needed at time of follow-up: BMP to assess creatine  3.  Pending labs/ test needing follow-up: RPR  Follow-up Appointments: Follow-up Information    Medicine, [facility] Family Follow up.   Specialty:  Family Medicine Contact information: [address] [name] [address] [phone]           Hospital Course by problem list: Mr. Tritle is a 53 year old male with chronicHFrEF, HTN, COPD, and substance use disorder who presents withshortnessof breath, orthopnea, and lower extremity edema was likely secondary to an acute on chronic heart failure exacerbation from medication noncompliance and recent substance abuse. I do question whether he has a component of COPD exacerbation as he has wheezing on exam and productive cough. He has not had PFTs in the past to diagnose COPD.  He was diuresed with IV lasix  40mg  BID for one day and then QD the next day. He diuresed very well. The swelling and pain in his lower extremities resolved with diuresis. There was concern for neuropathy so HIV, a1c, TSH were done and were all normal. RPR was pending at time of discharge. Given that his pain improved with diuresis, I doubt he has chronic neuropathy. On admission, he had a cough productive of clear sputum and diffuse wheezing on exam so he was also treated with po prednisone 40mg . He needs outpatient PFTs to formally diagnose COPD. He's smoked 1ppd since age 61.   He remained hypertensive despite diuresis. He had not taken his home clonidine in several weeks so this medication was discontinued and he was switched to amlodipine 10mg , lisinopril 40mg . His coreg was switched to metoprolol to decrease his pill burden but he left before receiving any of his discharge medications.    Discharge Vitals:   BP (!) 144/122   Pulse 99   Temp 97.7 F (36.5 C) (Oral)   Resp 15   Ht 5\' 9"  (1.753 m)   Wt 83.5 kg   SpO2 98%   BMI 27.17 kg/m   Pertinent Labs, Studies, and Procedures:  BMP Latest Ref Rng & Units 04/06/2019 04/05/2019 04/04/2019  Glucose 70 - 99 mg/dL 076(K) 088(P) 103(P)  BUN 6 - 20 mg/dL 59(Y) 58(P) 14  Creatinine 0.61 - 1.24 mg/dL 9.29(W) 4.46(K) 8.63  Sodium 135 - 145 mmol/L 142 140 138  Potassium 3.5 - 5.1 mmol/L 3.6 3.7 3.9  Chloride 98 - 111 mmol/L 107 103 107  CO2 22 - 32 mmol/L 25 27 23   Calcium 8.9 - 10.3 mg/dL 8.1(R) 9.2 7.1(H)     Discharge Instructions: Discharge Instructions    (HEART FAILURE PATIENTS) Call MD:  Anytime you have any of the following symptoms: 1) 3 pound weight gain in 24 hours or 5 pounds in 1 week 2) shortness of breath, with or without a dry hacking cough 3) swelling in the hands, feet or stomach 4) if you have to sleep on extra pillows at night in order to breathe.   Complete by:  As directed    Call MD for:  extreme fatigue   Complete by:  As directed    Diet - low  sodium heart healthy   Complete by:  As directed    Discharge instructions   Complete by:  As directed    Mr. Luke, Boody were admitted to the hospital for a heart failure exacerbation. This means you had too much fluid build up in your body and it was causing swelling and pain in your feet. You responded very well to the IV lasix (furosemide) medication. Your blood pressure continued to be very high so we changed some of your medications. Please pay close attention to your new medication list.   For your heart failure: -  your blood work shows you are a little dehydrated now from peeing out so much fluid - please hold your home lasix (furosemide) for two days. You can start taking it again on Friday, May 8th  For your blood pressure: - STOP carvedilol (coreg) - STOP clonidine  - continue lisinopril 40mg  once daily  - start amlodipine 10mg  once daily  - start metoprolol 25mg  once daily   For your lungs/breathing: - continue taking prednisone for two more days  If you experience worsening shortness of breath, chest pain, increased leg swelling or pain please come back to the hospital.   Increase activity slowly   Complete by:  As directed       Signed: Ali LoweVogel, Marie S, MD 04/06/2019, 11:13 AM   Pager: [phone]

## 2019-04-06 NOTE — Progress Notes (Signed)
Patient called 911 to complain about nursing staff being in his room after he called for pain medication. Officer [facility] and partner responded to unit and spoke with patient about the misuse of 911 services and advised patient a future incident would result in a citation. Patient continue to be verbally abuse to nursing staff and police officers.  The patient complained bitterly about the poor care he is receiving. This nurse advised the patient he is free to leave the hospital ama if he feels nothing is being done for him.  Will continue to monitor to the extent possible.

## 2019-04-06 NOTE — Progress Notes (Signed)
Responded to patient call bell that tele-monitor lead-set was off. Attempted to replace lead patches but was refused by patient stating "They're all on - I'm good". To the extent possible, will continue to monitor patient.

## 2019-04-06 NOTE — Progress Notes (Addendum)
   Subjective: More behavorial issues overnight. Patient called 911.   This morning, his bilateral foot pain has almost completely resolved. His breathing is good. Denies chest pain. Discussed medication changes and plan to discharge home after morning labs result.   Objective:  Vital signs in last 24 hours: Vitals:   04/05/19 1526 04/05/19 1858 04/06/19 0739 04/06/19 0749  BP: (!) 133/109 (!) 123/95  (!) 144/122  Pulse: 100 (!) 101 99   Resp: (!) 21 15    Temp: 97.9 F (36.6 C)  97.7 F (36.5 C)   TempSrc: Oral  Oral   SpO2: 97% 97% 98%   Weight:      Height:       Gen: laying in bed, NAD Cardiac: RRR, no m/r/g Pulm: CTAB, normal wob  Ext: no [name], bilateral feet swelling has resolved MSK: bilateral feet are non tender, normal ROM   Assessment/Plan:  Principal Problem:   Acute on chronic clinical systolic heart failure (HCC) Active Problems:   Bilateral foot pain   [name] is a 53 year old male with chronic HFrEF, HTN, COPD, and substance use disorder who presents with shortness of breath, orthopnea, and lower extremity edema was likely secondary to an acute on chronic heart failure exacerbation from medication noncompliance and recent substance abuse. I do question whether he has a component of COPD exacerbation as he has wheezing on exam and productive cough. He has not had PFTs in the past to diagnose COPD.  Non-hypoxic acute on chronic respiratory failure: 2/2 bi-ventricular heart failure and probably obstructive lung disease. Continues to diurese well with IV lasix. He is euvolemic on exam. I believe this is his dry weight. Will see what morning BMP shows and discharge home. He will likely need to hold lasix today and resume his home po lasix tomorrow.  - strict I/O, daily weight - continue lisinopril - add BB metoprolol XL 25mg  daily  - prednisone 40mg  qd x 5 days (5/4-5/8) - nebs - outpt PFTs - tobacco and cocaine cessation   Foot pain, bilateral: resolved  with diuresis. HIV neg. TSH normal. a1c only slightly elevated at 6.1. RPR pending.   Hypertension: improved with addition of amlodipine - continue lisinopril 40mg  qd, amlodipine 10mg  qd - start metoprolol XL 25mg  qd    FEN/GI: Regular  DVT prophylaxis: enoxaparin CODE STATUS: Full  Dispo: Anticipated discharge today after morning labs. Will send meds to [facility] pharmacy   Ali Lowe, MD 04/06/2019, 9:02 AM Pager: [phone]

## 2019-04-06 NOTE — Discharge Instructions (Signed)
Heart Failure    Heart failure means your heart has trouble pumping blood. This makes it hard for your body to work well. Heart failure is usually a long-term (chronic) condition. You must take good care of yourself and follow your treatment plan from your doctor.  Follow these instructions at home:  Medicines   Take over-the-counter and prescription medicines only as told by your doctor.  ? Do not stop taking your medicine unless your doctor told you to do that.  ? Do not skip any doses.  ? Refill your prescriptions before you run out of medicine. You need your medicines every day.  Eating and drinking     Eat heart-healthy foods. Talk with a diet and nutrition specialist (dietitian) to make an eating plan.   Choose foods that:  ? Have no trans fat.  ? Are low in saturated fat and cholesterol.   Choose healthy foods, like:  ? Fresh or frozen fruits and vegetables.  ? Fish.  ? Low-fat (lean) meats.  ? Legumes (like beans, peas, and lentils).  ? Fat-free or low-fat dairy products.  ? Whole-grain foods.  ? High-fiber foods.   Limit salt (sodium) if told by your doctor. Ask your nutrition specialist to recommend heart-healthy seasonings.   Cook in healthy ways instead of frying. Healthy ways of cooking include:  ? Roasting.  ? Grilling.  ? Broiling.  ? Baking.  ? Poaching.  ? Steaming.  ? Stir-frying.   Limit how much fluid you drink, if told by your doctor.  Lifestyle   Do not smoke or use chewing tobacco. Do not use nicotine gum or patches before talking to your doctor.   Limit alcohol intake to no more than 1 drink a day for non-pregnant women and 2 drinks a day for men. One drink equals 12 oz of beer, 5 oz of wine, or 1 oz of hard liquor.  ? Tell your doctor if you drink alcohol many times a week.  ? Talk with your doctor about whether any alcohol is safe for you.  ? You should stop drinking alcohol:  ? If your heart has been damaged by alcohol.  ? You have very bad heart failure.   Do not use illegal  drugs.   Lose weight if told by your doctor.   Do moderate physical activity if told by your doctor. Ask your doctor what activities are safe for you if:  ? You are of older age (elderly).  ? You have very bad heart failure.  Keep track of important information   Weigh yourself every day.  ? Weigh yourself every morning after you pee (urinate) and before breakfast.  ? Wear the same amount of clothing each time.  ? Write down your daily weight. Give your record to your doctor.   Check and write down your blood pressure as told by your doctor.   Check your pulse as told by your doctor.  Dealing with heat and cold   If the weather is very hot:  ? Avoid activity that takes a lot of energy.  ? Use air conditioning or fans, or find a cooler place.  ? Avoid caffeine.  ? Avoid alcohol.  ? Wear clothing that is loose-fitting, lightweight, and light-colored.   If the weather is very cold:  ? Avoid activity that takes a lot of energy.  ? Layer your clothes.  ? Wear mittens or gloves, a hat, and a scarf when you go outside.  ? Avoid alcohol.    General instructions   Manage other conditions that you have as told by your doctor.   Learn to manage stress. If you need help, ask your doctor.   Plan rest periods for when you get tired.   Get education and support as needed.   Get rehab (rehabilitation) to help you stay independent and to help with everyday tasks.   Stay up to date with shots (immunizations), especially pneumococcal and flu (influenza) shots.   Keep all follow-up visits as told by your doctor. This is important.  Contact a doctor if:   You gain weight quickly.   You are more short of breath than normal.   You cannot do your normal activities.   You tire easily.   You cough more than normal, especially with activity.   You have any or more puffiness (swelling) in areas such as your hands, feet, ankles, or belly (abdomen).   You cannot sleep because it is hard to breathe.   You feel like your heart  is beating fast (palpitations).   You get dizzy or light-headed when you stand up.  Get help right away if:   You have trouble breathing.   You or someone else notices a change in your awareness. This could be trouble staying awake or trouble concentrating.   You have chest pain or discomfort.   You pass out (faint).  Summary   Heart failure means your heart has trouble pumping blood.   Make sure you refill your prescriptions before you run out of medicine. You need your medicines every day.   Keep records of your weight and blood pressure to give to your doctor.   Contact a doctor if you gain weight quickly.  This information is not intended to replace advice given to you by your health care provider. Make sure you discuss any questions you have with your health care provider.  Document Released: 08/26/2008 Document Revised: 08/11/2018 Document Reviewed: 12/09/2016  Elsevier Interactive Patient Education  2019 [employer]

## 2019-04-06 NOTE — Progress Notes (Signed)
Refused for tech to draw am labs. Tech talked to nurse stated that she would get someone else to try.

## 2019-04-06 NOTE — Progress Notes (Signed)
Patient stated that he doesn't have money for his medications. CM alerted. Informed that patient that he does have discharge orders. Patient stated that he is ready to go. Did allowed for his IV to be taken out. Catheter intact. But he did not want his discharge paperwork or wait to find out about his medications if the facility was not buying the meds for him and he wants to smoke a cigarette. Patient left the unit without discharge paperwork or any medications.

## 2019-04-06 NOTE — TOC Transition Note (Addendum)
Transition of Care [facility]) - CM/SW Discharge Note Donn Pierini RN, BSN Transitions of Care Unit 4E- RN Case Manager [phone]   Patient Details  Name: [name] MRN: [id number] Date of Birth: [birth year]  Transition of Care [facility]) CM/SW Contact:  Darrold Span, RN Phone Number: 04/06/2019, 11:53 AM   Clinical Narrative:    Pt for transition home today, needs transportation- however buses are servicing free of charge- will provide pt info on bus routes. Pt had Medicaid but this termed on 4/30- FC has seen pt and workig on Medicaid paperwork.  Pt states he does not have any money to pay for meds- scripts have been sent to [facility] pharmacy- CM will assist with meds via MATCH- pt however grew impatient and left without meds prior to [facility] delivering to bedside.    Final next level of care: Home/Self Care Barriers to Discharge: No Barriers Identified   Patient Goals and CMS Choice     Choice offered to / list presented to : NA  Discharge Placement  Home- with friends                     Discharge Plan and Services   Discharge Planning Services: CM Consult, MATCH Program, Medication Assistance Post Acute Care Choice: NA          DME Arranged: N/A DME Agency: NA       HH Arranged: NA HH Agency: NA        Social Determinants of Health (SDOH) Interventions     Readmission Risk Interventions Readmission Risk Prevention Plan 04/06/2019 03/29/2019  Transportation Screening Complete Complete  PCP or Specialist Appt within 3-5 Days Complete Patient refused  HRI or Home Care Consult Complete -  Social Work Consult for Recovery Care Planning/Counseling Complete Complete  Palliative Care Screening Not Applicable Not Applicable  Medication Review [employer]) Complete Complete  Some recent data might be hidden

## 2019-04-06 NOTE — Progress Notes (Signed)
Patient c/o headache states that he is allergic to tylenol.  Diastolic pressure up this am. Lisinopril given early. MD called about headache. Patient angry with dietary staff stating that we are not feeding him enough. Patient has on brk tray with 4 omletes, bacon, biscuits and two bowls of grits.

## 2019-04-17 ENCOUNTER — Inpatient Hospital Stay (HOSPITAL_COMMUNITY)
Admission: EM | Admit: 2019-04-17 | Discharge: 2019-04-19 | DRG: 292 | Disposition: A | Discharge status: Home / Self Care | Payer: Medicaid Other | Attending: Internal Medicine | Admitting: Internal Medicine

## 2019-04-17 ENCOUNTER — Other Ambulatory Visit: Payer: Self-pay

## 2019-04-17 ENCOUNTER — Encounter (HOSPITAL_COMMUNITY): Payer: Self-pay | Admitting: Emergency Medicine

## 2019-04-17 ENCOUNTER — Emergency Department (HOSPITAL_COMMUNITY): Payer: Medicaid Other

## 2019-04-17 DIAGNOSIS — Z9114 Patient's other noncompliance with medication regimen: Secondary | ICD-10-CM

## 2019-04-17 DIAGNOSIS — F259 Schizoaffective disorder, unspecified: Secondary | ICD-10-CM | POA: Diagnosis present

## 2019-04-17 DIAGNOSIS — R062 Wheezing: Secondary | ICD-10-CM | POA: Diagnosis not present

## 2019-04-17 DIAGNOSIS — T502X5A Adverse effect of carbonic-anhydrase inhibitors, benzothiadiazides and other diuretics, initial encounter: Secondary | ICD-10-CM | POA: Diagnosis not present

## 2019-04-17 DIAGNOSIS — I509 Heart failure, unspecified: Secondary | ICD-10-CM

## 2019-04-17 DIAGNOSIS — Z66 Do not resuscitate: Secondary | ICD-10-CM | POA: Diagnosis present

## 2019-04-17 DIAGNOSIS — F1410 Cocaine abuse, uncomplicated: Secondary | ICD-10-CM

## 2019-04-17 DIAGNOSIS — I5023 Acute on chronic systolic (congestive) heart failure: Secondary | ICD-10-CM | POA: Diagnosis present

## 2019-04-17 DIAGNOSIS — I110 Hypertensive heart disease with heart failure: Principal | ICD-10-CM | POA: Diagnosis present

## 2019-04-17 DIAGNOSIS — E876 Hypokalemia: Secondary | ICD-10-CM | POA: Diagnosis present

## 2019-04-17 DIAGNOSIS — Z91040 Latex allergy status: Secondary | ICD-10-CM

## 2019-04-17 DIAGNOSIS — F319 Bipolar disorder, unspecified: Secondary | ICD-10-CM | POA: Diagnosis present

## 2019-04-17 DIAGNOSIS — F17210 Nicotine dependence, cigarettes, uncomplicated: Secondary | ICD-10-CM | POA: Diagnosis present

## 2019-04-17 DIAGNOSIS — Z7151 Drug abuse counseling and surveillance of drug abuser: Secondary | ICD-10-CM

## 2019-04-17 DIAGNOSIS — J449 Chronic obstructive pulmonary disease, unspecified: Secondary | ICD-10-CM | POA: Diagnosis present

## 2019-04-17 DIAGNOSIS — Z91018 Allergy to other foods: Secondary | ICD-10-CM

## 2019-04-17 DIAGNOSIS — Z1159 Encounter for screening for other viral diseases: Secondary | ICD-10-CM

## 2019-04-17 DIAGNOSIS — Z886 Allergy status to analgesic agent status: Secondary | ICD-10-CM

## 2019-04-17 DIAGNOSIS — Z590 Homelessness: Secondary | ICD-10-CM

## 2019-04-17 DIAGNOSIS — Z7952 Long term (current) use of systemic steroids: Secondary | ICD-10-CM

## 2019-04-17 DIAGNOSIS — F1010 Alcohol abuse, uncomplicated: Secondary | ICD-10-CM | POA: Diagnosis present

## 2019-04-17 DIAGNOSIS — N179 Acute kidney failure, unspecified: Secondary | ICD-10-CM | POA: Diagnosis present

## 2019-04-17 DIAGNOSIS — Z79899 Other long term (current) drug therapy: Secondary | ICD-10-CM

## 2019-04-17 DIAGNOSIS — Z813 Family history of other psychoactive substance abuse and dependence: Secondary | ICD-10-CM

## 2019-04-17 HISTORY — DX: Heart failure, unspecified: I50.9

## 2019-04-17 LAB — CBC WITH DIFFERENTIAL/PLATELET
Abs Immature Granulocytes: 0.02 10*3/uL (ref 0.00–0.07)
Basophils Absolute: 0 10*3/uL (ref 0.0–0.1)
Basophils Relative: 1 %
Eosinophils Absolute: 0 10*3/uL (ref 0.0–0.5)
Eosinophils Relative: 1 %
HCT: 38.1 % — ABNORMAL LOW (ref 39.0–52.0)
Hemoglobin: 12.9 g/dL — ABNORMAL LOW (ref 13.0–17.0)
Immature Granulocytes: 0 %
Lymphocytes Relative: 31 %
Lymphs Abs: 1.8 10*3/uL (ref 0.7–4.0)
MCH: 33.2 pg (ref 26.0–34.0)
MCHC: 33.9 g/dL (ref 30.0–36.0)
MCV: 98.2 fL (ref 80.0–100.0)
Monocytes Absolute: 0.6 10*3/uL (ref 0.1–1.0)
Monocytes Relative: 11 %
Neutro Abs: 3.3 10*3/uL (ref 1.7–7.7)
Neutrophils Relative %: 56 %
Platelets: 225 10*3/uL (ref 150–400)
RBC: 3.88 MIL/uL — ABNORMAL LOW (ref 4.22–5.81)
RDW: 17.1 % — ABNORMAL HIGH (ref 11.5–15.5)
WBC: 5.8 10*3/uL (ref 4.0–10.5)
nRBC: 0 % (ref 0.0–0.2)

## 2019-04-17 LAB — BRAIN NATRIURETIC PEPTIDE: B Natriuretic Peptide: 1616.8 pg/mL — ABNORMAL HIGH (ref 0.0–100.0)

## 2019-04-17 IMAGING — DX CHEST - 2 VIEW
2 series · 2 of 2 positions shown · non-contrast
Comparison: [DATE]

CLINICAL DATA: Shortness of breath

EXAM:
CHEST - 2 VIEW

[chest lat]
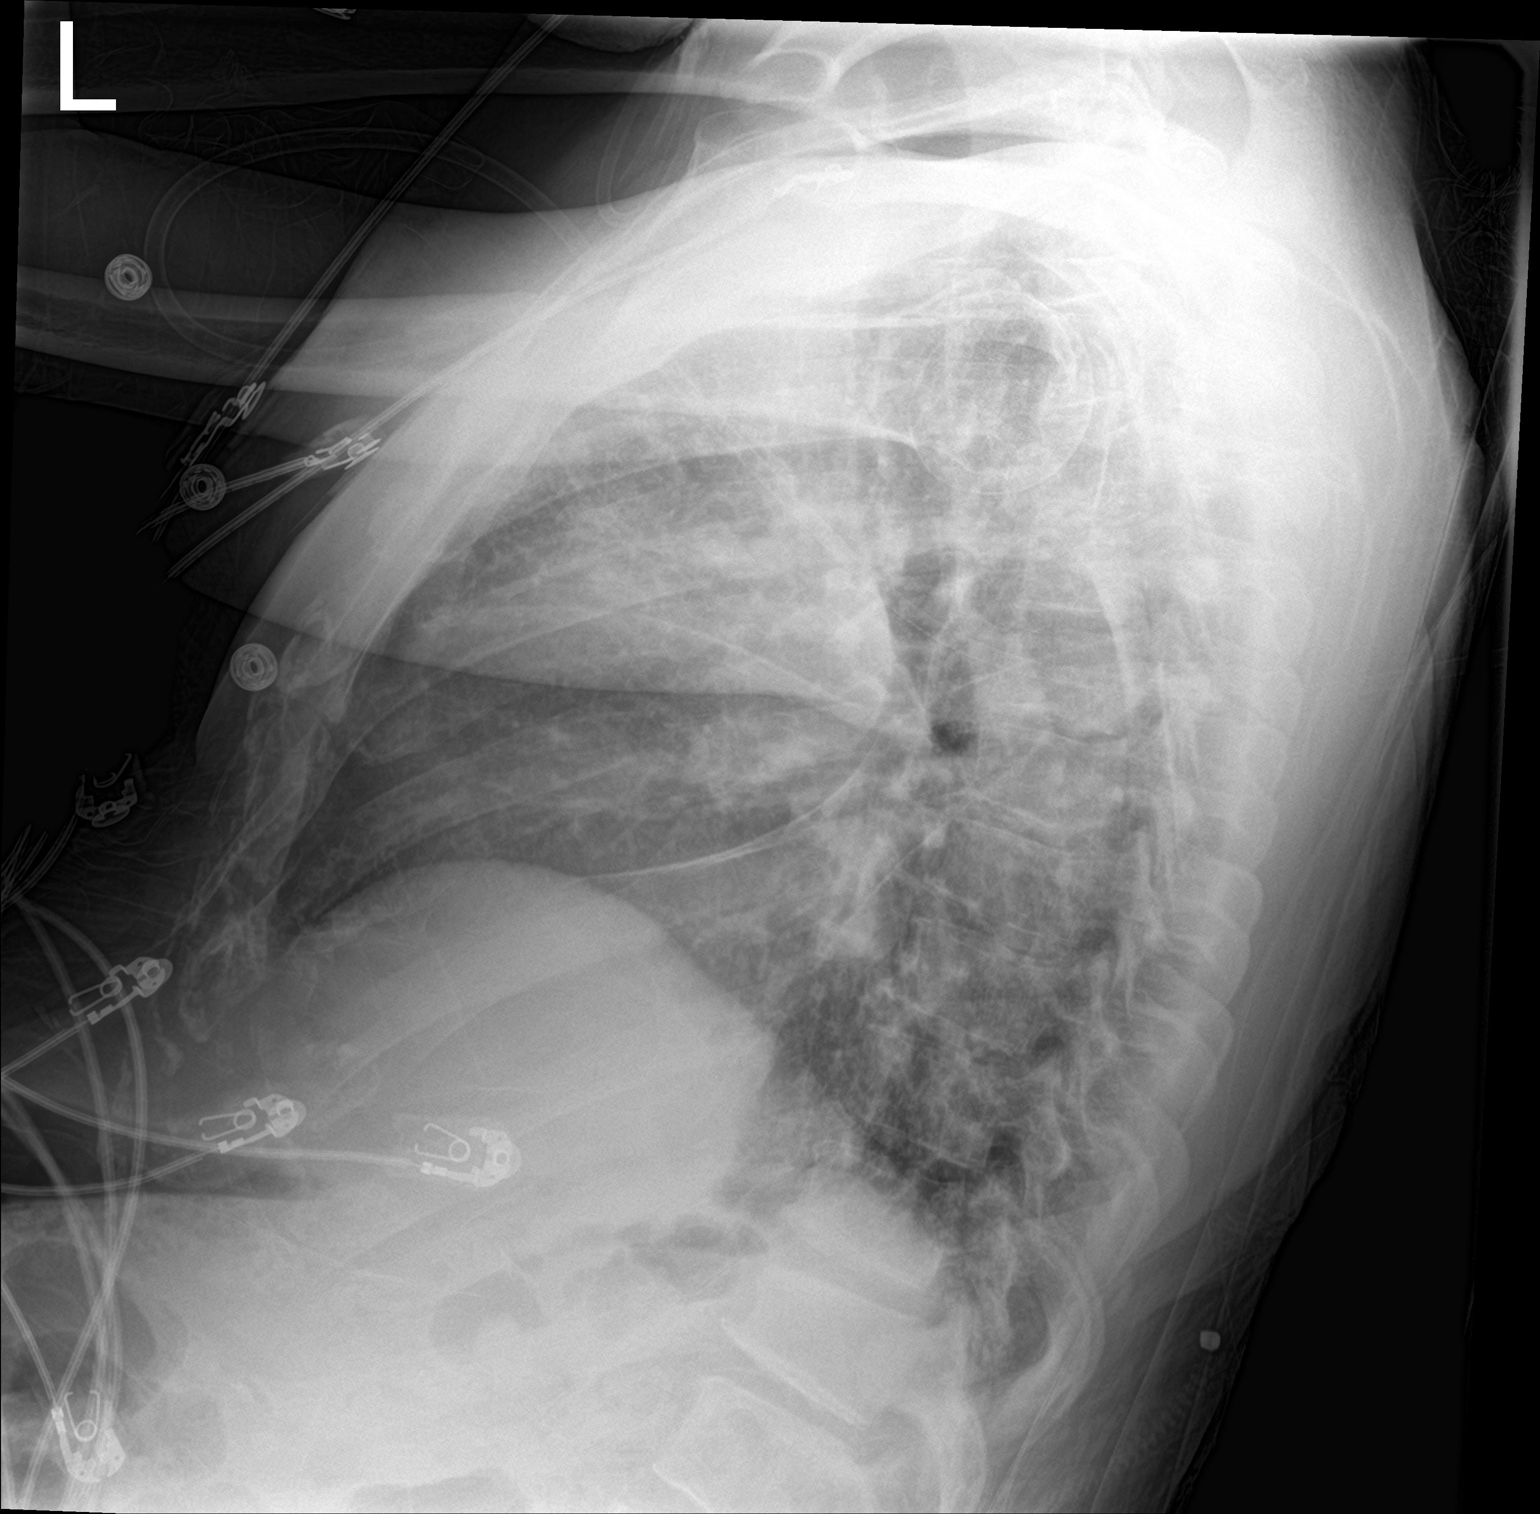

[chest ap]
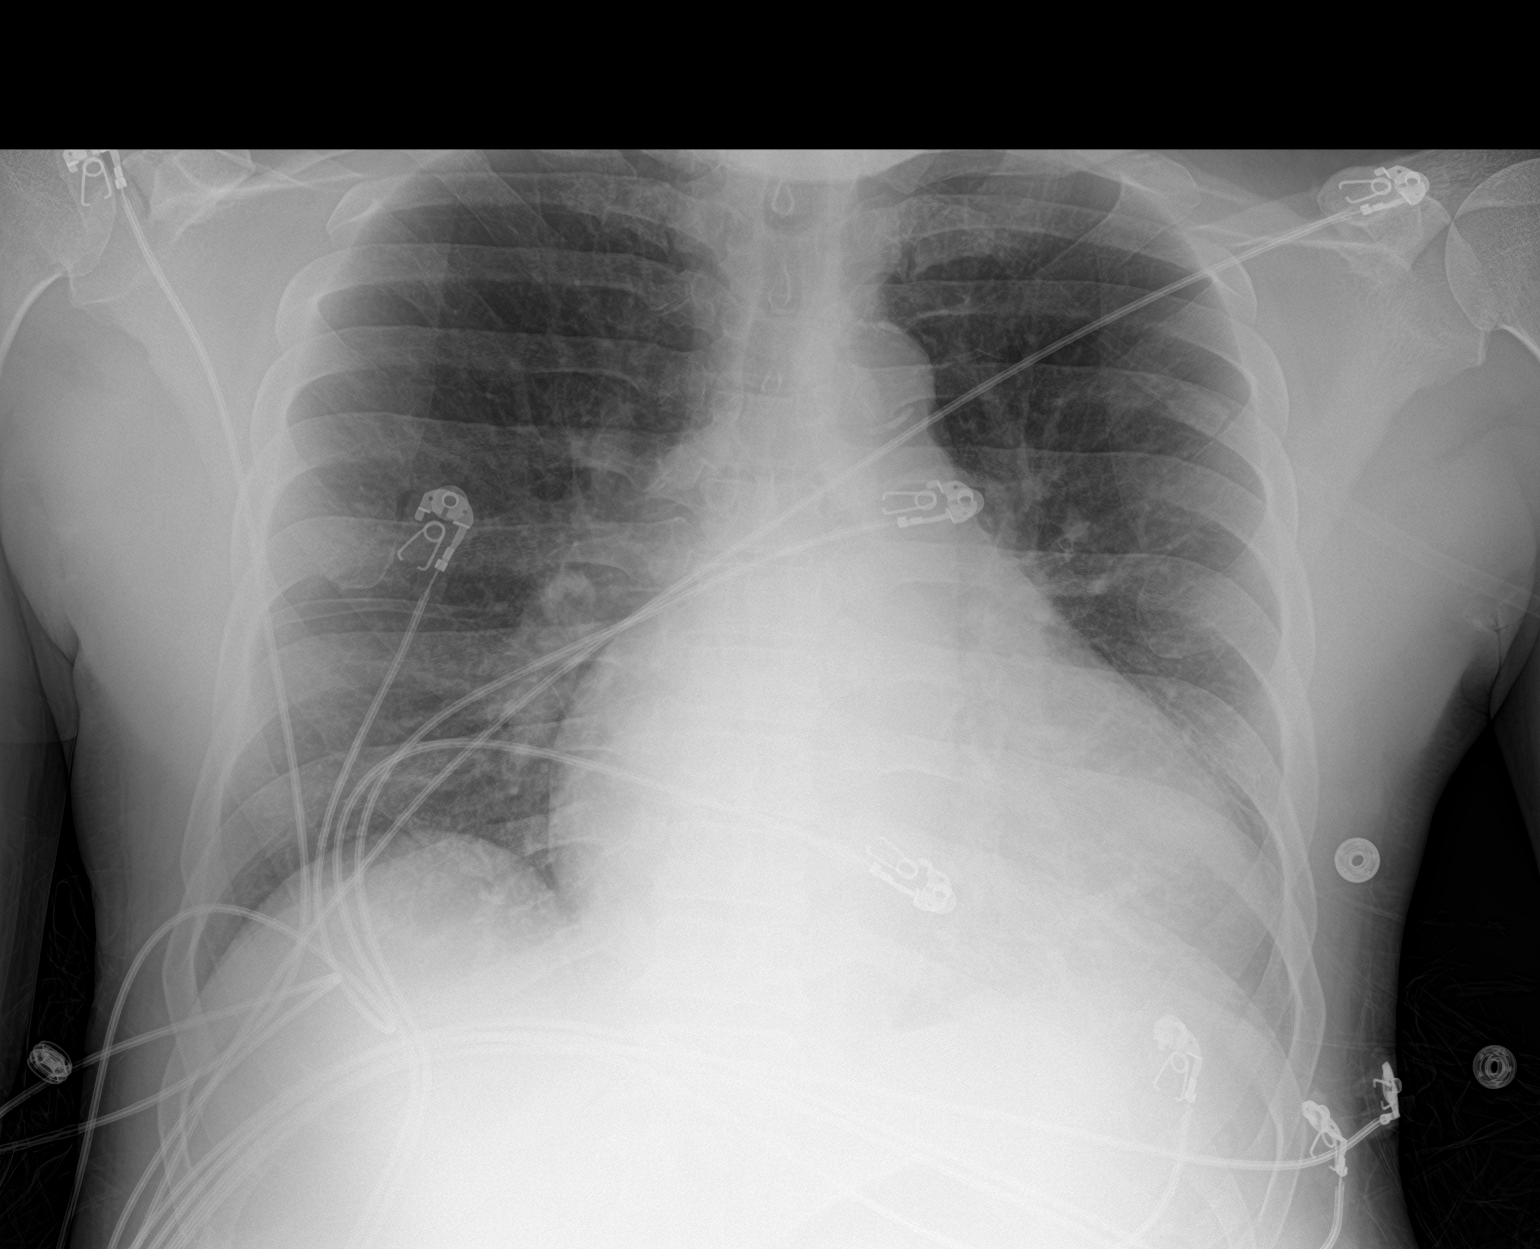

[2 of 2 positions shown; findings below may reference images not displayed]

FINDINGS: Gross cardiomegaly. Mild diffuse interstitial pulmonary opacity. The
visualized skeletal structures are unremarkable.
IMPRESSION: Gross cardiomegaly with mild diffuse interstitial pulmonary opacity,
likely edema. No focal airspace opacity.

## 2019-04-17 MED ORDER — FUROSEMIDE 10 MG/ML IJ SOLN
60.0000 mg | INTRAMUSCULAR | Status: AC
  Administered 2019-04-17: 60 mg via INTRAVENOUS
  Filled 2019-04-17: qty 6

## 2019-04-17 MED ORDER — OXYCODONE HCL 5 MG PO TABS
5.0000 mg | ORAL_TABLET | Freq: Once | ORAL | Status: AC
  Administered 2019-04-18: 01:00:00 5 mg via ORAL
  Filled 2019-04-17: qty 1

## 2019-04-17 NOTE — ED Provider Notes (Signed)
[facility]   CSN: [id number] Arrival date & time: 04/17/19  2201    History   Chief Complaint No chief complaint on file.   HPI Lelon FrohlichCorey Novell is a 53 y.o. male.    53 year old male with a history of bipolar disorder, hypertension, heart failure with reduced ejection fraction, cocaine abuse, homelessness presents to the ED for c/o SOB.  Symptoms have been progressively worsening over the past week.  Shortness of breath aggravated when patient is lying flat.  He has noticed progressive swelling in his legs up to his lower abdomen.  Feels that his symptoms are consistent with past exacerbations of CHF.  Alleges that his medications were called into the pharmacy and when he went to pick them up they were not there.  He has been noncompliant with his prescribed medications since his last hospital discharge.  Patient without hypoxia on EMS arrival.  He has been placed on oxygen for comfort with saturations improving from 94% up to 99%.  He denies fevers, chest pain, vomiting, syncope or near syncope.  The history is provided by the patient. No language interpreter was used.    Past Medical History:  Diagnosis Date  . Asthma   . Bipolar 1 disorder (HCC)   . Depression   . Homelessness   . Hypertension   . Schizoaffective disorder [facility](HCC)     Patient Active Problem List   Diagnosis Date Noted  . Bilateral foot pain 04/05/2019  . Acute on chronic clinical systolic heart failure (HCC) 04/04/2019  . Housing situation unstable 03/28/2019  . AKI (acute kidney injury) (HCC)   . Bilateral lower extremity edema   . Acute on chronic HFrEF (heart failure with reduced ejection fraction) (HCC) 03/27/2019  . COPD exacerbation (HCC)   . Acute exacerbation of CHF (congestive heart failure) (HCC) 03/04/2019  . Asthma 12/26/2016  . Tobacco use disorder 12/26/2016  . Schizophrenia (HCC) 12/26/2016  . Cocaine abuse with cocaine-induced mood  disorder (HCC) 09/06/2016  . Alcohol use disorder, mild, abuse 07/31/2016  . Substance induced mood disorder (HCC) 07/30/2016  . Schizoaffective disorder, bipolar type (HCC) 05/31/2016  . Cocaine use disorder, severe, dependence (HCC) 05/31/2016  . Essential hypertension 05/31/2016    No past surgical history on file.      Home Medications    Prior to Admission medications   Medication Sig Start Date End Date Taking? Authorizing Provider  ADVAIR DISKUS 250-50 MCG/DOSE AEPB Inhale 1 puff into the lungs 2 (two) times daily. 07/13/18   [provider]  albuterol (PROVENTIL HFA;VENTOLIN HFA) 108 (90 Base) MCG/ACT inhaler Inhale 1-2 puffs into the lungs every 4 (four) hours as needed for shortness of breath. 08/22/18   Dione BoozeGlick, [name], MD  amLODipine (NORVASC) 10 MG tablet Take 1 tablet (10 mg total) by mouth daily. 04/06/19   Ali LoweVogel, Marie S, MD  ammonium lactate (LAC-HYDRIN) 12 % lotion Apply 1 application topically 2 (two) times a day. To feet 10/13/18   [provider]  dextromethorphan-guaiFENesin (MUCINEX DM) 30-600 MG 12hr tablet Take 1 tablet by mouth 2 (two) times daily. 03/29/19   Arnetha CourserAmin, Sumayya, MD  furosemide (LASIX) 40 MG tablet Take 1 tablet (40 mg total) by mouth daily. 04/06/19   Ali LoweVogel, Marie S, MD  hydrOXYzine (ATARAX/VISTARIL) 25 MG tablet Take 1 tablet (25 mg total) by mouth 3 (three) times daily as needed for itching or anxiety. 03/29/19   Arnetha CourserAmin, Sumayya, MD  ipratropium (ATROVENT) 0.02 % nebulizer solution Take  2.5 mLs (0.5 mg total) by nebulization every 6 (six) hours as needed for wheezing or shortness of breath. 03/09/19   Claudean Severance, MD  lisinopril (ZESTRIL) 40 MG tablet Take 1 tablet (40 mg total) by mouth daily. 04/06/19   Ali Lowe, MD  metoprolol succinate (TOPROL-XL) 25 MG 24 hr tablet Take 1 tablet (25 mg total) by mouth daily. 04/06/19   Ali Lowe, MD  predniSONE (DELTASONE) 20 MG tablet Take 2 tablets (40 mg total) by mouth daily with breakfast.  04/07/19   Ali Lowe, MD    Family History Family History  Problem Relation Age of Onset  . Drug abuse Mother   . Suicidality Mother     Social History Social History   Tobacco Use  . Smoking status: Current Every Day Smoker    Types: Cigarettes  . Smokeless tobacco: Never Used  Substance Use Topics  . Alcohol use: Yes    Comment: infrequently  . Drug use: Not Currently    Types: Cocaine     Allergies   Acetaminophen; Peanut butter flavor; and Latex   Review of Systems Review of Systems Ten systems reviewed and are negative for acute change, except as noted in the HPI.    Physical Exam Updated Vital Signs BP (!) 143/86   Pulse (!) 105   Temp 98.7 F (37.1 C) (Oral)   Resp 20   Ht 5\' 9"  (1.753 m)   Wt 81.6 kg   SpO2 97%   BMI 26.58 kg/m   Physical Exam Vitals signs and nursing note reviewed.  Constitutional:      General: He is not in acute distress.    Appearance: He is well-developed. He is not diaphoretic.     Comments: Nontoxic appearing and in NAD  HENT:     Head: Normocephalic and atraumatic.  Eyes:     General: No scleral icterus.    Conjunctiva/sclera: Conjunctivae normal.  Neck:     Musculoskeletal: Normal range of motion.  Cardiovascular:     Rate and Rhythm: Normal rate and regular rhythm.     Pulses: Normal pulses.  Pulmonary:     Effort: Pulmonary effort is normal. No respiratory distress.     Comments: SpO2 90-93% on room air. Patient c/o of SOB, placed on 2L via [place] for comfort with improvement in saturations. Diffusely diminished breath sounds. Faint wheeze diffusely at end of expiration. Abdominal:     Comments: Soft, nontender abdomen  Musculoskeletal: Normal range of motion.     Right lower leg: Edema present.     Left lower leg: Edema present.  Skin:    General: Skin is warm and dry.     Coloration: Skin is not pale.     Findings: No erythema or rash.  Neurological:     General: No focal deficit present.     Mental  Status: He is alert and oriented to person, place, and time.     Coordination: Coordination normal.     Comments: GCS 15. Patient moving all extremities.  Psychiatric:        Speech: Speech normal.        Behavior: Behavior is agitated.      ED Treatments / Results  Labs (all labs ordered are listed, but only abnormal results are displayed) Labs Reviewed  CBC WITH DIFFERENTIAL/PLATELET - Abnormal; Notable for the following components:      Result Value   RBC 3.88 (*)    Hemoglobin 12.9 (*)  HCT 38.1 (*)    RDW 17.1 (*)    All other components within normal limits  BASIC METABOLIC PANEL - Abnormal; Notable for the following components:   Creatinine, Ser 1.47 (*)    Calcium 8.7 (*)    GFR calc non Af Amer 55 (*)    All other components within normal limits  BRAIN NATRIURETIC PEPTIDE - Abnormal; Notable for the following components:   B Natriuretic Peptide 1,616.8 (*)    All other components within normal limits  TROPONIN I - Abnormal; Notable for the following components:   Troponin I 0.05 (*)    All other components within normal limits  SARS CORONAVIRUS 2 (HOSPITAL ORDER, PERFORMED IN [facility])  RAPID URINE DRUG SCREEN, HOSP PERFORMED    EKG EKG Interpretation  Date/Time:  Sunday Apr 17 2019 22:09:39 EDT Ventricular Rate:  99 PR Interval:    QRS Duration: 94 QT Interval:  396 QTC Calculation: 509 R Axis:   19 Text Interpretation:  Sinus rhythm Biatrial enlargement Borderline T abnormalities, lateral leads Prolonged QT interval When compared with ECG of 04/04/2019, No significant change was found Confirmed by Glick, [name] ([id number]) on 04/18/2019 12:17:39 AM   Radiology Dg Chest 2 View  Result Date: 04/17/2019 CLINICAL DATA:  Shortness of breath EXAM: CHEST - 2 VIEW COMPARISON:  04/04/2019 FINDINGS: Gross cardiomegaly. Mild diffuse interstitial pulmonary opacity. The visualized skeletal structures are unremarkable. IMPRESSION: Gross cardiomegaly with  mild diffuse interstitial pulmonary opacity, likely edema. No focal airspace opacity. Electronically Signed   By: Alex  Bibbey M.D.   On: 04/17/2019 23:24    Procedures Procedures (including critical care time)  Medications Ordered in ED Medications  oxyCODONE (Oxy IR/ROXICODONE) immediate release tablet 5 mg (has no administration in time range)  furosemide (LASIX) injection 60 mg (60 mg Intravenous Given 04/17/19 2331)    1:23 AM Case discussed with IM teaching service who will admit.   Initial Impression / Assessment and Plan / ED Course  I have reviewed the triage vital signs and the nursing notes.  Pertinent labs & imaging results that were available during my care of the patient were reviewed by me and considered in my medical decision making (see chart for details).        52  year old male presents to the emergency department for evaluation of shortness of breath and orthopnea with increased lower extremity edema.  He has been noncompliant with his daily medications since hospital discharge 1.5 weeks ago.  His work-up today is consistent with CHF exacerbation.  Oxygen saturations down to 90% on room air.  He was placed on 2L O2 via nasal cannula for comfort.  He will require admission for diuresis.  Case discussed with internal medicine teaching service who will admit.   Final Clinical Impressions(s) / ED Diagnoses   Final diagnoses:  Acute on chronic congestive heart failure, unspecified heart failure type [facility])    ED Discharge Orders    None       Antony Madura, PA-C 04/18/19 0124    Blane Ohara, MD 04/19/19 [phone]

## 2019-04-17 NOTE — ED Triage Notes (Signed)
Pt from home with c/o SHOB, pain all over, and coughing up fluids x 1 week. Pt states Its been getting progressively worst today. Pt has hx of CHF. Pt reports the lasix he is taking is not working. EMS reports pt was 94% on Room air, then put pt on 2L A[place] with O2 sat at 97%. Pt is currently at 99% on RA

## 2019-04-18 DIAGNOSIS — N179 Acute kidney failure, unspecified: Secondary | ICD-10-CM | POA: Diagnosis present

## 2019-04-18 DIAGNOSIS — Z7151 Drug abuse counseling and surveillance of drug abuser: Secondary | ICD-10-CM | POA: Diagnosis not present

## 2019-04-18 DIAGNOSIS — Z7952 Long term (current) use of systemic steroids: Secondary | ICD-10-CM | POA: Diagnosis not present

## 2019-04-18 DIAGNOSIS — I110 Hypertensive heart disease with heart failure: Secondary | ICD-10-CM | POA: Diagnosis present

## 2019-04-18 DIAGNOSIS — F259 Schizoaffective disorder, unspecified: Secondary | ICD-10-CM | POA: Diagnosis present

## 2019-04-18 DIAGNOSIS — D649 Anemia, unspecified: Secondary | ICD-10-CM

## 2019-04-18 DIAGNOSIS — Z91018 Allergy to other foods: Secondary | ICD-10-CM | POA: Diagnosis not present

## 2019-04-18 DIAGNOSIS — R062 Wheezing: Secondary | ICD-10-CM | POA: Diagnosis not present

## 2019-04-18 DIAGNOSIS — T502X5A Adverse effect of carbonic-anhydrase inhibitors, benzothiadiazides and other diuretics, initial encounter: Secondary | ICD-10-CM | POA: Diagnosis not present

## 2019-04-18 DIAGNOSIS — Z66 Do not resuscitate: Secondary | ICD-10-CM

## 2019-04-18 DIAGNOSIS — Z886 Allergy status to analgesic agent status: Secondary | ICD-10-CM | POA: Diagnosis not present

## 2019-04-18 DIAGNOSIS — E876 Hypokalemia: Secondary | ICD-10-CM | POA: Diagnosis present

## 2019-04-18 DIAGNOSIS — Z91120 Patient's intentional underdosing of medication regimen due to financial hardship: Secondary | ICD-10-CM

## 2019-04-18 DIAGNOSIS — F1010 Alcohol abuse, uncomplicated: Secondary | ICD-10-CM | POA: Diagnosis present

## 2019-04-18 DIAGNOSIS — F319 Bipolar disorder, unspecified: Secondary | ICD-10-CM | POA: Diagnosis present

## 2019-04-18 DIAGNOSIS — Z590 Homelessness: Secondary | ICD-10-CM | POA: Diagnosis not present

## 2019-04-18 DIAGNOSIS — F1410 Cocaine abuse, uncomplicated: Secondary | ICD-10-CM | POA: Diagnosis present

## 2019-04-18 DIAGNOSIS — Z1159 Encounter for screening for other viral diseases: Secondary | ICD-10-CM | POA: Diagnosis not present

## 2019-04-18 DIAGNOSIS — F17210 Nicotine dependence, cigarettes, uncomplicated: Secondary | ICD-10-CM | POA: Diagnosis present

## 2019-04-18 DIAGNOSIS — R7989 Other specified abnormal findings of blood chemistry: Secondary | ICD-10-CM

## 2019-04-18 DIAGNOSIS — Z813 Family history of other psychoactive substance abuse and dependence: Secondary | ICD-10-CM | POA: Diagnosis not present

## 2019-04-18 DIAGNOSIS — I5023 Acute on chronic systolic (congestive) heart failure: Secondary | ICD-10-CM | POA: Diagnosis present

## 2019-04-18 DIAGNOSIS — Z9114 Patient's other noncompliance with medication regimen: Secondary | ICD-10-CM | POA: Diagnosis not present

## 2019-04-18 DIAGNOSIS — J449 Chronic obstructive pulmonary disease, unspecified: Secondary | ICD-10-CM | POA: Diagnosis present

## 2019-04-18 DIAGNOSIS — Z79899 Other long term (current) drug therapy: Secondary | ICD-10-CM | POA: Diagnosis not present

## 2019-04-18 DIAGNOSIS — Z91040 Latex allergy status: Secondary | ICD-10-CM | POA: Diagnosis not present

## 2019-04-18 LAB — BASIC METABOLIC PANEL
Anion gap: 11 (ref 5–15)
BUN: 17 mg/dL (ref 6–20)
CO2: 22 mmol/L (ref 22–32)
Calcium: 8.7 mg/dL — ABNORMAL LOW (ref 8.9–10.3)
Chloride: 105 mmol/L (ref 98–111)
Creatinine, Ser: 1.47 mg/dL — ABNORMAL HIGH (ref 0.61–1.24)
GFR calc Af Amer: >60 mL/min (ref >60)
GFR calc non Af Amer: 55 mL/min — ABNORMAL LOW (ref >60)
Glucose, Bld: 93 mg/dL (ref 70–99)
Potassium: 3.6 mmol/L (ref 3.5–5.1)
Sodium: 138 mmol/L (ref 135–145)

## 2019-04-18 LAB — TROPONIN I: Troponin I: 0.05 ng/mL (ref <0.03)

## 2019-04-18 LAB — RAPID URINE DRUG SCREEN, HOSP PERFORMED
Amphetamines: NOT DETECTED
Barbiturates: NOT DETECTED
Benzodiazepines: NOT DETECTED
Cocaine: POSITIVE — AB
Opiates: NOT DETECTED
Tetrahydrocannabinol: NOT DETECTED

## 2019-04-18 LAB — SARS CORONAVIRUS 2 BY RT PCR (HOSPITAL ORDER, PERFORMED IN ~~LOC~~ HOSPITAL LAB): SARS Coronavirus 2: NEGATIVE

## 2019-04-18 MED ORDER — BENZOCAINE 10 % MT GEL
Freq: Three times a day (TID) | OROMUCOSAL | Status: DC | PRN
  Administered 2019-04-18: 19:00:00 via OROMUCOSAL
  Filled 2019-04-18: qty 9

## 2019-04-18 MED ORDER — FUROSEMIDE 10 MG/ML IJ SOLN
40.0000 mg | Freq: Two times a day (BID) | INTRAMUSCULAR | Status: DC
  Filled 2019-04-18: qty 4

## 2019-04-18 MED ORDER — ONDANSETRON HCL 4 MG/2ML IJ SOLN: 4.0000 mg | Freq: Four times a day (QID) | INTRAMUSCULAR | Status: DC | PRN

## 2019-04-18 MED ORDER — IPRATROPIUM BROMIDE 0.02 % IN SOLN: 0.5000 mg | Freq: Four times a day (QID) | RESPIRATORY_TRACT | Status: DC | PRN

## 2019-04-18 MED ORDER — ONDANSETRON HCL 4 MG PO TABS: 4.0000 mg | ORAL_TABLET | Freq: Four times a day (QID) | ORAL | Status: DC | PRN

## 2019-04-18 MED ORDER — ENOXAPARIN SODIUM 40 MG/0.4ML ~~LOC~~ SOLN: 40.0000 mg | SUBCUTANEOUS | Status: DC

## 2019-04-18 MED ORDER — TERBINAFINE HCL 1 % EX CREA
1.0000 "application " | TOPICAL_CREAM | Freq: Every day | CUTANEOUS | Status: DC
  Administered 2019-04-18 – 2019-04-19 (×2): 1 via TOPICAL
  Filled 2019-04-18: qty 12

## 2019-04-18 MED ORDER — MOMETASONE FURO-FORMOTEROL FUM 200-5 MCG/ACT IN AERO
2.0000 | INHALATION_SPRAY | Freq: Two times a day (BID) | RESPIRATORY_TRACT | Status: DC
  Administered 2019-04-18: 2 via RESPIRATORY_TRACT
  Filled 2019-04-18: qty 8.8

## 2019-04-18 MED ORDER — HYDROXYZINE HCL 25 MG PO TABS: 25.0000 mg | ORAL_TABLET | Freq: Three times a day (TID) | ORAL | Status: DC | PRN

## 2019-04-18 MED ORDER — FUROSEMIDE 10 MG/ML IJ SOLN
40.0000 mg | Freq: Two times a day (BID) | INTRAMUSCULAR | Status: DC
  Administered 2019-04-18 – 2019-04-19 (×3): 40 mg via INTRAVENOUS
  Filled 2019-04-18 (×2): qty 4

## 2019-04-18 MED ORDER — ALBUTEROL SULFATE (2.5 MG/3ML) 0.083% IN NEBU: 3.0000 mL | INHALATION_SOLUTION | RESPIRATORY_TRACT | Status: DC | PRN

## 2019-04-18 NOTE — Progress Notes (Signed)
Patient refuse lab drawn this am MD notified.

## 2019-04-18 NOTE — Progress Notes (Signed)
Pt. Arrived to the floor from ED. Pt. Refusing to have admission vitals, weight, and heart monitor per assigned RN. This RN called to pts. Room. Pt. Requesting a fruit cup. RN informed pt. That he could have the fruit cup if it was available but staff needed to get his weight and vitals signs. RN educated pt. About the importance of obtaining vitals. Pt. Stated he will think about it. This RN provided pt. With applesauce. Pt. Continues to refuse to let staff obtain vitals or weight. Pt. Stated " I do not deal with certain people and if they get an attitude with me, imma get an attitude with them. I'm not doing anything for anybody if I don't want to. Now get out of my room, turn the lights off and close the door". Pt. Requested urinal and did allow this RN to place telemetry monitor. CCMD notified. Pt. In stable condition. On call for IMTS to be notified of patients status.

## 2019-04-18 NOTE — Progress Notes (Signed)
   Subjective:  [name] is a 53 y.o. M with PMH of unstable housing, HFrEF, COPD, HTN, hx of cocaine use admit for dyspnea on hospital day 0  [name] was examined and evaluated at bedside this AM. He states he is still experiencing dyspnea with wheezing but he understands it is due to 'too much fluid in his lungs.' He mentions some productive cough with clear sputum. He has been having significant urinary output overnight but states his respiratory status is still same as on admission. He requests increase in portion for his meals. Denies any chest pain, palpitations, nausea, vomiting.  Objective:  Vital signs in last 24 hours: Vitals:   04/17/19 2345 04/18/19 0000 04/18/19 0045 04/18/19 0519  BP: (!) 167/116 (!) 179/130 (!) 143/86 (!) 162/117  Pulse:   (!) 105 92  Resp: (!) 28 (!) 26 20 18   Temp:    98.1 F (36.7 C)  TempSrc:    Oral  SpO2:   97% 98%  Weight:      Height:       Gen: Well-developed, well nourished, NAD CV: RRR, S1, S2 normal, No rubs, no murmurs, no gallops Pulm: Coarse breath sounds, expiratory wheezes, rales up to 2/3 thorax Abd: Soft, BS+, NTND, No rebound, no guarding Extm: ROM intact, Peripheral pulses intact, 1+ pitting edema up to mid-shins, tenderness to palpation of lower extremities. Skin: Dry, Warm, normal turgor, no rashes, lesions, wounds.  Assessment/Plan: Active Problems:   Acute on chronic systolic heart failure (HCC)  [name] is a 53 y.o. M with PMH of unstable housing, HFrEF, COPD, HTN, hx of cocaine use admit for dyspnea 2/2 acute systolic heart failure exacerbation. He has hx of frequent re-admission w/ similar presenting symptoms mostly due to medication non-adherence and con-current cocaine use. Will require continuing inpatient admission for IV diuresis. Goal dry weight is 183 lbs or until unable to tolerate further diuresis.  Acute on Chronic Systolic Heart Failure Exacerbation 2/2 med non-adherence & cocaine use Prescribed  furosemide 40mg  daily at home but unable to afford his meds. UDS + for cocaine. TTE on 03/28/19 show EF of 15 w/ diffuse left ventricular hypokinesis. Admit Chest X-ray shows pulmonary edema. Output 2L overnight after IV 60mg  lasix  - C/w IV furosemide 40mg  BID  - Trend BMP - Mag level - Strict I&Os, Daily Weights - Fluid restriction - Keep O2 sat >88 - Replenish K as needed >4.0  Troponemia 2/2 demand Admit trop 0.05. No ST elevation or depression on EKG. Denies any current chest pain - Trend troponin x3  AKI 2/2 hypervolemia  BUN 17, Creatinine 1.47. Baseline <1.2 - Trend BMP  HTN BP 162/117. UDS + for cocaine. On amlodipine, metoprolol at home - C/w IV diuresis  COPD Wheezing on exam. On Advair BID at home - Dulera 2 puffs BID - Albuterol inhaler as needed  Hx of cocaine use UDS + for cocaine - Holding home metoprolol  DVT prophx: Lovenox Diet: Cardiac Code: DNR  Dispo: Anticipated discharge in approximately 3-4 day(s).   Theotis Barrio, MD 04/18/2019, 6:34 AM Pager: [phone]

## 2019-04-18 NOTE — Clinical Social Work Note (Signed)
CSW acknowledges consult regarding questions about insurance and medication assistance. RNCM is aware patient has questions about medication assistance. Her note states that financial counselor should be meeting with patient regarding insurance.  Charlynn Court, CSW [phone]

## 2019-04-18 NOTE — Progress Notes (Signed)
Patient does not want me to do anything for him, refuse to answer assessment questions and told me to get out of his room.

## 2019-04-18 NOTE — ED Notes (Signed)
ED TO INPATIENT HANDOFF REPORT  ED Nurse Name and Phone #:  Baird LyonsCasey [phone]  S Name/Age/Gender [name] 53 y.o. male Room/Bed: 022C/022C  Code Status   Code Status: DNR  Home/SNF/Other Home Patient oriented to: self, place, time and situation Is this baseline? Yes   Triage Complete: Triage complete  Chief Complaint sob  Triage Note Pt from home with c/o SHOB, pain all over, and coughing up fluids x 1 week. Pt states Its been getting progressively worst today. Pt has hx of CHF. Pt reports the lasix he is taking is not working. EMS reports pt was 94% on Room air, then put pt on 2L [place] with O2 sat at 97%. Pt is currently at 99% on RA   Allergies Allergies  Allergen Reactions  . Acetaminophen Hives, Nausea And Vomiting and Rash    Pt overdosed on bottle of APAP 05/06/16   . Peanut Butter Flavor Itching and Nausea And Vomiting    Patient doesn't recognize, but it's been in Epic since 2019 (leaving it)  . Latex Rash    Level of Care/Admitting Diagnosis ED Disposition    ED Disposition Condition Comment   Admit  [facility] [[id number][facility]  Level of Care: Telemetry Cardiac [103]  Covid Evaluation: Screening Protocol (No Symptoms)  Diagnosis: Acute on chronic systolic heart failure (HCC) [428.23.ICD-9-CM]  Admitting Physician: Inez CatalinaMULLEN, EMILY B [phone][[id number]]  Attending Physician: Inez CatalinaMULLEN, EMILY B [phone][[id number]]  Estimated length of stay: 3 - 4 days  Certification:: I certify this patient will need inpatient services for at least 2 midnights  PT Class (Do Not Modify): Inpatient [101]  PT Acc Code (Do Not Modify): Private [1]       B Medical/Surgery History Past Medical History:  Diagnosis Date  . Asthma   . Bipolar 1 disorder (HCC)   . Depression   . Homelessness   . Hypertension   . Schizoaffective disorder (HCC)    No past surgical history on file.   A IV Location/Drains/Wounds Patient Lines/Drains/Airways Status   Active Line/Drains/Airways    None          Intake/Output Last 24 hours  Intake/Output Summary (Last 24 hours) at 04/18/2019 0200 Last data filed at 04/18/2019 0046 Gross per 24 hour  Intake -  Output 1900 ml  Net -1900 ml    Labs/Imaging Results for orders placed or performed during the hospital encounter of 04/17/19 (from the past 48 hour(s))  CBC with Differential     Status: Abnormal   Collection Time: 04/17/19 10:10 PM  Result Value Ref Range   WBC 5.8 4.0 - 10.5 K/uL   RBC 3.88 (L) 4.22 - 5.81 MIL/uL   Hemoglobin 12.9 (L) 13.0 - 17.0 g/dL   HCT 54.038.1 (L) 98.139.0 - 19.152.0 %   MCV 98.2 80.0 - 100.0 fL   MCH 33.2 26.0 - 34.0 pg   MCHC 33.9 30.0 - 36.0 g/dL   RDW 47.817.1 (H) 29.511.5 - 62.115.5 %   Platelets 225 150 - 400 K/uL   nRBC 0.0 0.0 - 0.2 %   Neutrophils Relative % 56 %   Neutro Abs 3.3 1.7 - 7.7 K/uL   Lymphocytes Relative 31 %   Lymphs Abs 1.8 0.7 - 4.0 K/uL   Monocytes Relative 11 %   Monocytes Absolute 0.6 0.1 - 1.0 K/uL   Eosinophils Relative 1 %   Eosinophils Absolute 0.0 0.0 - 0.5 K/uL   Basophils Relative 1 %   Basophils Absolute 0.0 0.0 -  0.1 K/uL   Immature Granulocytes 0 %   Abs Immature Granulocytes 0.02 0.00 - 0.07 K/uL    Comment: Performed at [facility], [address], [place], [name] [address]  Basic metabolic panel     Status: Abnormal   Collection Time: 04/17/19 10:10 PM  Result Value Ref Range   Sodium 138 135 - 145 mmol/L   Potassium 3.6 3.5 - 5.1 mmol/L   Chloride 105 98 - 111 mmol/L   CO2 22 22 - 32 mmol/L   Glucose, Bld 93 70 - 99 mg/dL   BUN 17 6 - 20 mg/dL   Creatinine, Ser 6.04 (H) 0.61 - 1.24 mg/dL   Calcium 8.7 (L) 8.9 - 10.3 mg/dL   GFR calc non Af Amer 55 (L) >60 mL/min   GFR calc Af Amer >60 >60 mL/min   Anion gap 11 5 - 15    Comment: Performed at [facility], [address] [place], [name] [address]  Brain natriuretic peptide     Status: Abnormal   Collection Time: 04/17/19 10:10 PM  Result Value Ref Range   B Natriuretic Peptide 1,616.8 (H)  0.0 - 100.0 pg/mL    Comment: Performed at [facility], [address], [place], [name] [address]  Troponin I - ONCE - STAT     Status: Abnormal   Collection Time: 04/17/19 10:10 PM  Result Value Ref Range   Troponin I 0.05 (HH) <0.03 ng/mL    Comment: CRITICAL RESULT CALLED TO, READ BACK BY AND VERIFIED WITH: [name],K RN 04/18/2019 0003 [name] Performed at [facility], [address], [place], [name] [address]   SARS Coronavirus 2 (CEPHEID - Performed in [facility] hospital lab), Hosp Order     Status: None   Collection Time: 04/17/19 11:33 PM  Result Value Ref Range   SARS Coronavirus 2 NEGATIVE NEGATIVE    Comment: (NOTE) If result is NEGATIVE SARS-CoV-2 target nucleic acids are NOT DETECTED. The SARS-CoV-2 RNA is generally detectable in upper and lower  respiratory specimens during the acute phase of infection. The lowest  concentration of SARS-CoV-2 viral copies this assay can detect is 250  copies / mL. A negative result does not preclude SARS-CoV-2 infection  and should not be used as the sole basis for treatment or other  patient management decisions.  A negative result may occur with  improper specimen collection / handling, submission of specimen other  than nasopharyngeal swab, presence of viral mutation(s) within the  areas targeted by this assay, and inadequate number of viral copies  (<250 copies / mL). A negative result must be combined with clinical  observations, patient history, and epidemiological information. If result is POSITIVE SARS-CoV-2 target nucleic acids are DETECTED. The SARS-CoV-2 RNA is generally detectable in upper and lower  respiratory specimens dur ing the acute phase of infection.  Positive  results are indicative of active infection with SARS-CoV-2.  Clinical  correlation with patient history and other diagnostic information is  necessary to determine patient infection status.  Positive results do  not rule out bacterial  infection or co-infection with other viruses. If result is PRESUMPTIVE POSTIVE SARS-CoV-2 nucleic acids MAY BE PRESENT.   A presumptive positive result was obtained on the submitted specimen  and confirmed on repeat testing.  While 2019 novel coronavirus  (SARS-CoV-2) nucleic acids may be present in the submitted sample  additional confirmatory testing may be necessary for epidemiological  and / or clinical management  purposes  to differentiate between  SARS-CoV-2 and other Sarbecovirus currently known to infect humans.  If clinically indicated additional testing with an alternate test  methodology [phone]) is advised. The SARS-CoV-2 RNA is generally  detectable in upper and lower respiratory sp ecimens during the acute  phase of infection. The expected result is Negative. Fact Sheet for Patients:  BoilerBrush.com.cy Fact Sheet for Healthcare Providers: https://pope.com/ This test is not yet approved or cleared by the [place] FDA and has been authorized for detection and/or diagnosis of SARS-CoV-2 by FDA under an Emergency Use Authorization (EUA).  This EUA will remain in effect (meaning this test can be used) for the duration of the COVID-19 declaration under Section 564(b)(1) of the Act, 21 U.S.C. section 360bbb-3(b)(1), unless the authorization is terminated or revoked sooner. Performed at [facility], [address], [place], [name] [address]   Rapid urine drug screen (hospital performed)     Status: Abnormal   Collection Time: 04/18/19  1:00 AM  Result Value Ref Range   Opiates NONE DETECTED NONE DETECTED   Cocaine POSITIVE (A) NONE DETECTED   Benzodiazepines NONE DETECTED NONE DETECTED   Amphetamines NONE DETECTED NONE DETECTED   Tetrahydrocannabinol NONE DETECTED NONE DETECTED   Barbiturates NONE DETECTED NONE DETECTED    Comment: (NOTE) DRUG SCREEN FOR MEDICAL PURPOSES ONLY.  IF CONFIRMATION IS NEEDED FOR  ANY PURPOSE, NOTIFY LAB WITHIN 5 DAYS. LOWEST DETECTABLE LIMITS FOR URINE DRUG SCREEN Drug Class                     Cutoff (ng/mL) Amphetamine and metabolites    1000 Barbiturate and metabolites    200 Benzodiazepine                 200 Tricyclics and metabolites     300 Opiates and metabolites        300 Cocaine and metabolites        300 THC                            50 Performed at [facility], [address], [place], [name] [address]    Dg Chest 2 View  Result Date: 04/17/2019 CLINICAL DATA:  Shortness of breath EXAM: CHEST - 2 VIEW COMPARISON:  04/04/2019 FINDINGS: Gross cardiomegaly. Mild diffuse interstitial pulmonary opacity. The visualized skeletal structures are unremarkable. IMPRESSION: Gross cardiomegaly with mild diffuse interstitial pulmonary opacity, likely edema. No focal airspace opacity. Electronically Signed   By: Lauralyn Primes M.D.   On: 04/17/2019 23:24    Pending Labs Unresulted Labs (From admission, onward)    Start     Ordered   04/18/19 0500  Basic metabolic panel  Tomorrow morning,   R     04/18/19 0148   04/18/19 0500  Magnesium  Tomorrow morning,   R     04/18/19 0148   04/18/19 0148  Troponin I - Now Then Q6H  Now then every 6 hours,   R     04/18/19 0148          Vitals/Pain Today's Vitals   04/17/19 2300 04/17/19 2345 04/18/19 0000 04/18/19 0045  BP: (!) 161/102 (!) 167/116 (!) 179/130 (!) 143/86  Pulse:    (!) 105  Resp: (!) 26 (!) 28 (!) 26 20  Temp:      TempSrc:      SpO2:    97%  Weight:  Height:      PainSc:        Isolation Precautions No active isolations  Medications Medications  hydrOXYzine (ATARAX/VISTARIL) tablet 25 mg (has no administration in time range)  albuterol (PROVENTIL) (2.5 MG/3ML) 0.083% nebulizer solution 3 mL (has no administration in time range)  ipratropium (ATROVENT) nebulizer solution 0.5 mg (has no administration in time range)  enoxaparin (LOVENOX) injection 40 mg (has no  administration in time range)  ondansetron (ZOFRAN) tablet 4 mg (has no administration in time range)    Or  ondansetron (ZOFRAN) injection 4 mg (has no administration in time range)  furosemide (LASIX) injection 40 mg (has no administration in time range)  furosemide (LASIX) injection 60 mg (60 mg Intravenous Given 04/17/19 2331)  oxyCODONE (Oxy IR/ROXICODONE) immediate release tablet 5 mg (5 mg Oral Given 04/18/19 0059)    Mobility walks Low fall risk   Focused Assessments Cardiac Assessment Handoff:  Cardiac Rhythm: Normal sinus rhythm Lab Results  Component Value Date   TROPONINI 0.05 (HH) 04/17/2019   No results found for: DDIMER Does the Patient currently have chest pain? No     R Recommendations: See Admitting Provider Note  Report given to:   Additional Notes:

## 2019-04-18 NOTE — Progress Notes (Signed)
Patient refuse lab drawn again this afternoon, MD notified, also patient request to see the doctor, MD paged, MD came to the floor to see the patient. Will continue to monitor the patient.

## 2019-04-18 NOTE — Progress Notes (Signed)
Patient allowed charge nurse to put telemetry box on.  Still refusing vitals and all other care.  Internal Medicine notified.

## 2019-04-18 NOTE — ED Notes (Signed)
Pt OK to eat per Kelly-PA, pt given [place] sandwich and sprite.

## 2019-04-18 NOTE — Progress Notes (Signed)
Patient refused morning labs.

## 2019-04-18 NOTE — Progress Notes (Signed)
RN spoke with MD.  She is aware of patient behavior and refusal of care.

## 2019-04-18 NOTE — Progress Notes (Signed)
   04/18/19 0519  Vitals  Temp 98.1 F (36.7 C)  Temp Source Oral  BP (!) 162/117  MAP (mmHg) 131  BP Location Right Arm  BP Method Automatic  Patient Position (if appropriate) Lying  Pulse Rate 92  Resp 18  Oxygen Therapy  SpO2 98 %  O2 Device Room Air  MEWS Score  MEWS RR 0  MEWS Pulse 0  MEWS Systolic 0  MEWS LOC 0  MEWS Temp 0  MEWS Score 0  MEWS Score Color Green   Pt allowed for VS to be taken.  BP 162/117. Notified IMTS.  Patient will not allow for retake of BP to be sure it is accurate.

## 2019-04-18 NOTE — Progress Notes (Signed)
Patient was discharged from here on 04/06/19 and states he has not taken any medications since he was discharged.  Please contact pharmacy if further assistance is needed.   Charolotte Eke, PharmD. Mobile: [phone]. 04/18/2019,9:12 AM.

## 2019-04-18 NOTE — TOC Initial Note (Signed)
Transition of Care [facility]) - Initial/Assessment Note    Patient Details  Name: Edisson Pavlovich MRN: [id number] Date of Birth: 05/23/1966  [facility] CM/SW Contact:    Reola Mosher Phone Number: [phone] 04/18/2019, 8:54 AM  Clinical Narrative:                 04/18/2019 - CM following for progression of care; pt independent of his ADL's; Financial Counselor to see pt - Medicaid paperwork in progress; B Shelba Flake  5/6//2020 - Clinical Narrative:    Pt for transition home today, needs transportation- however buses are servicing free of charge- will provide pt info on bus routes. Pt had Medicaid but this termed on 4/30- FC has seen pt and working on [employer] paperwork.  Pt states he does not have any money to pay for meds- scripts have been sent to [facility] pharmacy- CM will assist with meds via MATCH- pt however grew impatient and left without meds prior to [facility] delivering to bedside. Baruch Gouty RN CM   Expected Discharge Plan: Home/Self Care Barriers to Discharge: No Barriers Identified   Patient Goals and CMS Choice Patient states their goals for this hospitalization and ongoing recovery are:: to go home CMS Medicare.gov Compare Post Acute Care list provided to:: Patient Choice offered to / list presented to : NA  Expected Discharge Plan and Services Expected Discharge Plan: Home/Self Care In-house Referral: NA Discharge Planning Services: CM Consult Post Acute Care Choice: NA Living arrangements for the past 2 months: Homeless Shelter                 DME Arranged: N/A DME Agency: NA       HH Arranged: NA HH Agency: NA        Prior Living Arrangements/Services Living arrangements for the past 2 months: Homeless Shelter Lives with:: Self          Need for Family Participation in Patient Care: No (Comment) Care giver support system in place?: No (comment)   Criminal Activity/Legal Involvement Pertinent to Current  Situation/Hospitalization: No - Comment as needed  Activities of Daily Living      Permission Sought/Granted                  Emotional Assessment         Alcohol / Substance Use: Illicit Drugs Psych Involvement: No (comment)  Admission diagnosis:  Cocaine abuse (HCC) [F14.10] Acute on chronic congestive heart failure, unspecified heart failure type (HCC) [I50.9] Patient Active Problem List   Diagnosis Date Noted  . Acute on chronic systolic heart failure (HCC) 04/18/2019  . Bilateral foot pain 04/05/2019  . Acute on chronic clinical systolic heart failure (HCC) 04/04/2019  . Housing situation unstable 03/28/2019  . AKI (acute kidney injury) (HCC)   . Bilateral lower extremity edema   . Acute on chronic HFrEF (heart failure with reduced ejection fraction) (HCC) 03/27/2019  . COPD exacerbation (HCC)   . Acute exacerbation of CHF (congestive heart failure) (HCC) 03/04/2019  . Asthma 12/26/2016  . Tobacco use disorder 12/26/2016  . Schizophrenia (HCC) 12/26/2016  . Cocaine abuse with cocaine-induced mood disorder (HCC) 09/06/2016  . Alcohol use disorder, mild, abuse 07/31/2016  . Substance induced mood disorder (HCC) 07/30/2016  . Schizoaffective disorder, bipolar type (HCC) 05/31/2016  . Cocaine use disorder, severe, dependence (HCC) 05/31/2016  . Essential hypertension 05/31/2016   PCP:  Medicine, [facility]:   [facility] [phone] - [place], [place] - [address] AT [facility] [address] [name] [address] Phone: [phone] Fax: [phone]  [facility] [place], [name] - [address] [name] [address] Phone: [phone] Fax: [phone]     Social Determinants of Health (SDOH) Interventions    Readmission Risk Interventions Readmission Risk Prevention Plan 04/06/2019 03/29/2019  Transportation Screening Complete Complete  PCP or  Specialist Appt within 3-5 Days Complete Patient refused  HRI or Home Care Consult Complete -  Social Work Consult for Recovery Care Planning/Counseling Complete Complete  Palliative Care Screening Not Applicable Not Applicable  Medication Review Oceanographer) Complete Complete  Some recent data might be hidden

## 2019-04-18 NOTE — Progress Notes (Addendum)
Patient arrived to unit.  Patient refusing any type of care or admission assessment.  Charge nurse asked if staff could at least place patient on the monitor and get vital signs.  Patient stated "I will think about it".  RN walked into patient room to get stethoscope that was left behind since patient refused care and patient stated "you can get the hell out".  RN left patient room and notified charge nurse of patient behavior.

## 2019-04-18 NOTE — ED Notes (Signed)
No addl blood draw,  Pt moved to inpatient bed.

## 2019-04-18 NOTE — H&P (Signed)
Date: 04/18/2019               Patient Name:  [name] MRN: [id number]  DOB: 1966/03/30 Age / Sex: 53 y.o., male   PCP: [facility], [facility]         Medical Service: Internal Medicine Teaching Service         Attending Physician: Dr. Inez Catalina, MD    First Contact: Dr. Nedra Hai Pager: [phone]  Second Contact: Dr. Evelene Croon Pager: [phone]       After Hours (After 5p/  First Contact Pager: [phone]  weekends / holidays): Second Contact Pager: [phone]   Chief Complaint: SOB  History of Present Illness: [name] is a 53 year old male with HFrEF  (EF 15% on 4/20 ECHO) with multiple admissions for heart failure exacerbation, substance use disorder (cocaine), and hypertension who presents with shortness of breath.  He was recently admitted from 5/4 to 5/6 for acute on chronic heart failure exacerbation medication noncompliance and recent substance abuse. He diuresed well but unfortunately left the hospital prior to receiving any of his discharge medications.  He states that since being discharged from the hospital "he was unable to get his medications as he believed they were called into the pharmacy and he went to pick them up and they were not there".  He states that over the past week he has had progressively worsening shortness of breath and orthopnea.  He reports using cocaine 1 week ago.  He denies fevers, chills, chest pain, vomiting, nausea, changes in bowel movements, and urinary symptoms.  In the ED he was found to be hypertensive with BP up to 179/130.  He was also found to be slightly tachypneic up to 28.  He was however oxygenating well on room air.  For a normocytic anemia with a hemoglobin of 12.9 (at baseline).  BMP showed elevated creatinine of 1.47 (baseline between 1.3-1.5). BNP 1616.8.  Elevated troponin 0.05.  UDS positive for cocaine.  Meds: No current facility-administered medications on file prior to encounter.    Current Outpatient Medications on  File Prior to Encounter  Medication Sig Dispense Refill  . ADVAIR DISKUS 250-50 MCG/DOSE AEPB Inhale 1 puff into the lungs 2 (two) times daily.  0  . albuterol (PROVENTIL HFA;VENTOLIN HFA) 108 (90 Base) MCG/ACT inhaler Inhale 1-2 puffs into the lungs every 4 (four) hours as needed for shortness of breath. 1 Inhaler 1  . amLODipine (NORVASC) 10 MG tablet Take 1 tablet (10 mg total) by mouth daily. 30 tablet 0  . ammonium lactate (LAC-HYDRIN) 12 % lotion Apply 1 application topically 2 (two) times a day. To feet    . dextromethorphan-guaiFENesin (MUCINEX DM) 30-600 MG 12hr tablet Take 1 tablet by mouth 2 (two) times daily. 30 tablet 0  . furosemide (LASIX) 40 MG tablet Take 1 tablet (40 mg total) by mouth daily. 30 tablet 0  . hydrOXYzine (ATARAX/VISTARIL) 25 MG tablet Take 1 tablet (25 mg total) by mouth 3 (three) times daily as needed for itching or anxiety. 30 tablet 0  . ipratropium (ATROVENT) 0.02 % nebulizer solution Take 2.5 mLs (0.5 mg total) by nebulization every 6 (six) hours as needed for wheezing or shortness of breath. 75 mL 0  . lisinopril (ZESTRIL) 40 MG tablet Take 1 tablet (40 mg total) by mouth daily. 30 tablet 0  . metoprolol succinate (TOPROL-XL) 25 MG 24 hr tablet Take 1 tablet (25 mg total) by mouth daily. 30 tablet 0  . predniSONE (  DELTASONE) 20 MG tablet Take 2 tablets (40 mg total) by mouth daily with breakfast. 4 tablet 0   Allergies: Allergies as of 04/17/2019 - Review Complete 04/17/2019  Allergen Reaction Noted  . Acetaminophen Hives, Nausea And Vomiting, and Rash 08/10/2015  . Peanut butter flavor Itching and Nausea And Vomiting 01/07/2018  . Latex Rash 03/04/2019   Past Medical History:  Diagnosis Date  . Asthma   . Bipolar 1 disorder (HCC)   . Depression   . Homelessness   . Hypertension   . Schizoaffective disorder (HCC)    Family History  Problem Relation Age of Onset  . Drug abuse Mother   . Suicidality Mother    Social History: Lives with a friend.  Last used cocaine 7 days ago. No other drug use. No recent use of EtOH.   Review of Systems: A complete ROS was negative except as per HPI.   Physical Exam: Blood pressure (!) 143/86, pulse (!) 105, temperature 98.7 F (37.1 C), temperature source Oral, resp. rate 20, height 5\' 9"  (1.753 m), weight 81.6 kg, SpO2 97 %. General: Lying in bed in no acute distress HEENT: Normocephalic and atraumatic, EOMI Cardiovascular: Normal rate, regular rhythm Respiratory: Crackles heard at the left lung base, faint wheezes present, normal work of breathing, normal oxygen saturations on 3 L supplemental oxygen. Abdominal: Soft, nontender palpation, no mass appreciated MSK: 1+ lower extremity edema up to the calf bilaterally, tenderness to palpation of the right foot, no excessive warmth noted bilaterally Psych: Agitated, angry when answering questions Skin: Warm and dry  EKG: personally reviewed my interpretation is sinus rhythm, prolonged QT to 396  CXR: personally reviewed my interpretation is interstitial passive days noted at the lower lung fields bilaterally.  Stable cardiomegaly.  No bony abnormalities.  Assessment & Plan by Problem: Active Problems:   Acute on chronic systolic heart failure [facility])  [name] is a 53 year old male with HFrEF (LVEF 15% on 4/20 ECHO), substance use disorder (cocaine), and hypertension who presents with signs and symptoms of volume overload. He was subsequently diagnosised with an acute on chronic systolic heart failure exacerbation 2/2 medications nonadherence and admitted for further evaluation/management.   Acute on Chronic Systolic Heart Failure Exacerbation: s/p 60 mg IV Lasix - Start IV furosemide 40 mg BID  - Strict I&O's - Daily weights  - Admit to telemetry with continuous cardiac monitoring  Wheezing: Question whether he has underlying COPD.  He has not had PFTs in the past.  During last admission he was treated with prednisone and recommended  outpatient PFTs. - Albuterol every 4 hours as needed shortness of breath - Atrovent every 6 hours PRN wheezing  Diet: Heart healthy + fluid restriction  VTE prophylaxis: Lovenox CODE STATUS: DNR  Dispo: Admit patient to Inpatient with expected length of stay greater than 2 midnights.  Signed: Synetta Shadow, MD 04/18/2019, 1:18 AM  Pager: [phone]

## 2019-04-18 NOTE — Progress Notes (Signed)
Patient BP is 164/122, MD notified, MD said they will round on the patient this am. Will continue to monitor the patient.

## 2019-04-18 NOTE — Progress Notes (Signed)
IMTS returned page about patient's elevated BP. No further orders at this time.

## 2019-04-19 ENCOUNTER — Encounter (HOSPITAL_COMMUNITY): Payer: Self-pay | Admitting: General Practice

## 2019-04-19 DIAGNOSIS — E876 Hypokalemia: Secondary | ICD-10-CM

## 2019-04-19 DIAGNOSIS — I110 Hypertensive heart disease with heart failure: Principal | ICD-10-CM

## 2019-04-19 DIAGNOSIS — Z888 Allergy status to other drugs, medicaments and biological substances status: Secondary | ICD-10-CM

## 2019-04-19 DIAGNOSIS — Z9114 Patient's other noncompliance with medication regimen: Secondary | ICD-10-CM

## 2019-04-19 DIAGNOSIS — Z598 Other problems related to housing and economic circumstances: Secondary | ICD-10-CM

## 2019-04-19 DIAGNOSIS — N179 Acute kidney failure, unspecified: Secondary | ICD-10-CM

## 2019-04-19 DIAGNOSIS — F1490 Cocaine use, unspecified, uncomplicated: Secondary | ICD-10-CM

## 2019-04-19 DIAGNOSIS — Z91040 Latex allergy status: Secondary | ICD-10-CM

## 2019-04-19 DIAGNOSIS — I5023 Acute on chronic systolic (congestive) heart failure: Secondary | ICD-10-CM

## 2019-04-19 DIAGNOSIS — K0889 Other specified disorders of teeth and supporting structures: Secondary | ICD-10-CM

## 2019-04-19 DIAGNOSIS — Z7951 Long term (current) use of inhaled steroids: Secondary | ICD-10-CM

## 2019-04-19 DIAGNOSIS — Z91010 Allergy to peanuts: Secondary | ICD-10-CM

## 2019-04-19 DIAGNOSIS — J449 Chronic obstructive pulmonary disease, unspecified: Secondary | ICD-10-CM

## 2019-04-19 LAB — BASIC METABOLIC PANEL
Anion gap: 10 (ref 5–15)
BUN: 14 mg/dL (ref 6–20)
CO2: 26 mmol/L (ref 22–32)
Calcium: 8.8 mg/dL — ABNORMAL LOW (ref 8.9–10.3)
Chloride: 103 mmol/L (ref 98–111)
Creatinine, Ser: 1.26 mg/dL — ABNORMAL HIGH (ref 0.61–1.24)
GFR calc Af Amer: >60 mL/min (ref >60)
GFR calc non Af Amer: >60 mL/min (ref >60)
Glucose, Bld: 92 mg/dL (ref 70–99)
Potassium: 3.4 mmol/L — ABNORMAL LOW (ref 3.5–5.1)
Sodium: 139 mmol/L (ref 135–145)

## 2019-04-19 LAB — MAGNESIUM: Magnesium: 1.8 mg/dL (ref 1.7–2.4)

## 2019-04-19 MED ORDER — FUROSEMIDE 40 MG PO TABS: 40.0000 mg | ORAL_TABLET | Freq: Two times a day (BID) | ORAL | 0 refills | Status: DC

## 2019-04-19 MED ORDER — LIDOCAINE 5 % EX PTCH
1.0000 | MEDICATED_PATCH | CUTANEOUS | Status: DC
  Administered 2019-04-19: 1 via TRANSDERMAL
  Filled 2019-04-19: qty 1

## 2019-04-19 MED ORDER — MAGNESIUM SULFATE 2 GM/50ML IV SOLN
2.0000 g | Freq: Once | INTRAVENOUS | Status: DC
  Filled 2019-04-19: qty 50

## 2019-04-19 MED ORDER — POTASSIUM CHLORIDE CRYS ER 20 MEQ PO TBCR
40.0000 meq | EXTENDED_RELEASE_TABLET | Freq: Once | ORAL | Status: AC
  Administered 2019-04-19: 40 meq via ORAL
  Filled 2019-04-19: qty 2

## 2019-04-19 MED FILL — FUROSEMIDE 40 MG TABLET: 40 | 30 days supply | Qty: 60 | Fill #0

## 2019-04-19 NOTE — TOC Transition Note (Signed)
Transition of Care [facility]) - CM/SW Discharge Note   Patient Details  Name: [name] MRN: [id number] Date of Birth: October 30, 1966  Transition of Care [facility]) CM/SW Contact:  Reola Mosher Phone Number: [phone] 04/19/2019, 12:11 PM   Clinical Narrative:    Patient is for discharge home today; prescriptions filled by [facility] pharmacy and to be delivered to the patient's room prior to discharging home.   Final next level of care: Home/Self Care Barriers to Discharge: No Barriers Identified   Patient Goals and CMS Choice Patient states their goals for this hospitalization and ongoing recovery are:: to go home CMS Medicare.gov Compare Post Acute Care list provided to:: Patient Choice offered to / list presented to : NA  Discharge Placement                       Discharge Plan and Services In-house Referral: NA Discharge Planning Services: CM Consult Post Acute Care Choice: NA          DME Arranged: N/A DME Agency: NA       HH Arranged: NA HH Agency: NA        Social Determinants of Health (SDOH) Interventions     Readmission Risk Interventions Readmission Risk Prevention Plan 04/06/2019 03/29/2019  Transportation Screening Complete Complete  PCP or Specialist Appt within 3-5 Days Complete Patient refused  HRI or Home Care Consult Complete -  Social Work Consult for Recovery Care Planning/Counseling Complete Complete  Palliative Care Screening Not Applicable Not Applicable  Medication Review Oceanographer) Complete Complete  Some recent data might be hidden

## 2019-04-19 NOTE — Discharge Instructions (Signed)
[name]  You came to [place] with complaints of leg swelling and shortness of breath. We have determined that this was caused by your heart failure. Here are our recommendations for you at discharge:  - Please make sure to take furosemide 40mg  twice a day - Try to avoid cocaine use - Please stop taking metoprolol 25mg  daily - Please stop taking lisinopril 40mg  daily - Continue to take your other home medications as prescribed.  Thank you for choosing [place].    Heart Failure  Heart failure means your heart has trouble pumping blood. This makes it hard for your body to work well. Heart failure is usually a long-term (chronic) condition. You must take good care of yourself and follow your treatment plan from your doctor. Follow these instructions at home: Medicines  Take over-the-counter and prescription medicines only as told by your doctor. ? Do not stop taking your medicine unless your doctor told you to do that. ? Do not skip any doses. ? Refill your prescriptions before you run out of medicine. You need your medicines every day. Eating and drinking   Eat heart-healthy foods. Talk with a diet and nutrition specialist (dietitian) to make an eating plan.  Choose foods that: ? Have no trans fat. ? Are low in saturated fat and cholesterol.  Choose healthy foods, like: ? Fresh or frozen fruits and vegetables. ? Fish. ? Low-fat (lean) meats. ? Legumes (like beans, peas, and lentils). ? Fat-free or low-fat dairy products. ? Whole-grain foods. ? High-fiber foods.  Limit salt (sodium) if told by your doctor. Ask your nutrition specialist to recommend heart-healthy seasonings.  Cook in healthy ways instead of frying. Healthy ways of cooking include: ? Roasting. ? Grilling. ? Broiling. ? Baking. ? Poaching. ? Steaming. ? Stir-frying.  Limit how much fluid you drink, if told by your doctor. Lifestyle  Do not smoke or use chewing tobacco. Do not use nicotine gum or  patches before talking to your doctor.  Limit alcohol intake to no more than 1 drink a day for non-pregnant women and 2 drinks a day for men. One drink equals 12 oz of beer, 5 oz of wine, or 1 oz of hard liquor. ? Tell your doctor if you drink alcohol many times a week. ? Talk with your doctor about whether any alcohol is safe for you. ? You should stop drinking alcohol: ? If your heart has been damaged by alcohol. ? You have very bad heart failure.  Do not use illegal drugs.  Lose weight if told by your doctor.  Do moderate physical activity if told by your doctor. Ask your doctor what activities are safe for you if: ? You are of older age (elderly). ? You have very bad heart failure. Keep track of important information  Weigh yourself every day. ? Weigh yourself every morning after you pee (urinate) and before breakfast. ? Wear the same amount of clothing each time. ? Write down your daily weight. Give your record to your doctor.  Check and write down your blood pressure as told by your doctor.  Check your pulse as told by your doctor. Dealing with heat and cold  If the weather is very hot: ? Avoid activity that takes a lot of energy. ? Use air conditioning or fans, or find a cooler place. ? Avoid caffeine. ? Avoid alcohol. ? Wear clothing that is loose-fitting, lightweight, and light-colored.  If the weather is very cold: ? Avoid activity that takes a lot of  energy. ? Layer your clothes. ? Wear mittens or gloves, a hat, and a scarf when you go outside. ? Avoid alcohol. General instructions  Manage other conditions that you have as told by your doctor.  Learn to manage stress. If you need help, ask your doctor.  Plan rest periods for when you get tired.  Get education and support as needed.  Get rehab (rehabilitation) to help you stay independent and to help with everyday tasks.  Stay up to date with shots (immunizations), especially pneumococcal and flu  (influenza) shots.  Keep all follow-up visits as told by your doctor. This is important. Contact a doctor if:  You gain weight quickly.  You are more short of breath than normal.  You cannot do your normal activities.  You tire easily.  You cough more than normal, especially with activity.  You have any or more puffiness (swelling) in areas such as your hands, feet, ankles, or belly (abdomen).  You cannot sleep because it is hard to breathe.  You feel like your heart is beating fast (palpitations).  You get dizzy or light-headed when you stand up. Get help right away if:  You have trouble breathing.  You or someone else notices a change in your awareness. This could be trouble staying awake or trouble concentrating.  You have chest pain or discomfort.  You pass out (faint). Summary  Heart failure means your heart has trouble pumping blood.  Make sure you refill your prescriptions before you run out of medicine. You need your medicines every day.  Keep records of your weight and blood pressure to give to your doctor.  Contact a doctor if you gain weight quickly. This information is not intended to replace advice given to you by your health care provider. Make sure you discuss any questions you have with your health care provider. Document Released: 08/26/2008 Document Revised: 08/11/2018 Document Reviewed: 12/09/2016 Elsevier Interactive Patient Education  2019 [name].

## 2019-04-19 NOTE — Progress Notes (Signed)
   Subjective:  Adrien Greenaway is a 53 y.o. M with PMH of unstable housing, HFrEF, COPD, HTN, Hx of cocaine use admit for dyspnea on hospital day 1  Mr.Rosberg was examined and evaluated at bedside this AM. He was observed resting comfortably in bed this AM. He states his swelling has significantly improved but has main complaints of productive clear sputum. He mentions that this appears to occur 'when I have too much fluid.' Continuing to have good urinary output. Denies any chest pain, palpitations, dyspnea.  Objective:  Vital signs in last 24 hours: Vitals:   04/18/19 1310 04/18/19 1624 04/18/19 1940 04/19/19 0113  BP: 137/83 (!) 163/108  (!) 141/111  Pulse: 89 94 91 91  Resp: 18 17 18 20   Temp: 98.5 F (36.9 C)   97.7 F (36.5 C)  TempSrc: Oral   Oral  SpO2: 99% 98% 99% 99%  Weight:      Height:       Physical Exam  Constitutional: He is oriented to person, place, and time and well-developed, well-nourished, and in no distress. No distress.  HENT:  Poor dentition  Eyes: Conjunctivae are normal.  Neck: Normal range of motion. Neck supple. JVD (mild) present.  Cardiovascular: Normal rate, regular rhythm, normal heart sounds and intact distal pulses.  No murmur heard. Pulmonary/Chest: Effort normal.  Transmitted upper airway noises  Abdominal: Soft. Bowel sounds are normal.  Musculoskeletal: Normal range of motion.        General: Edema (1+ pitting edema) present.  Neurological: He is alert and oriented to person, place, and time.  Skin: Skin is warm and dry. He is not diaphoretic.   Assessment/Plan:  Active Problems:   Acute on chronic systolic heart failure (HCC)  Harison Olesky is a 53 y.o. M with PMH of unstable housing, HFrEF, COPD, HTN, hx of cocaine use admit for dyspnea 2/2 acute systolic heart failure exacerbation. He had 6L urinary output overnight. Currently awaiting daily weight to assess proximity to dry weight. His renal function improved w/ IV diuresis and  on exam has significant improvement in edema. May be able to be discharged today if dry weight (183lbs) has been reached.     Acute on Chronic Systolic Heart Failure Exacerbation 2/2 med non-adherence & cocaine use Hx of non-adherence due to financial barriers. UDS + for cocaine. TTE on 03/28/19 show EF of 15% w/ diffuse left ventricular hypokinesis. Admit Chest X-ray shows pulmonary edema. Output 6L overnight. - C/w IV furosemide 40mg  BID  - Trend BMP - Mag level - Strict I&Os, Daily Weights - Fluid restriction - Keep O2 sat >88 - Replenish K as needed >4.0  Hypokalemia 2/2 diuresis K 3.6->3.4. Mag 1.8 - Kcl PO 40 mEq - Magnesium Sulfate IV 2g  AKI 2/2 hypervolemia  Creatinine 1.47->1.26. Improving w/ diuresis, back to baseline. - Resolved  COPD Wheezing on exam. On Advair BID at home - Dulera 2 puffs BID - Albuterol inhaler as needed  DVT prophx: Lovenox Diet: Cardiac Code: DNR  Dispo: Anticipated discharge in approximately 0-1 day(s).   Theotis Barrio, MD 04/19/2019, 7:31 AM Pager: [phone]

## 2019-04-19 NOTE — Discharge Summary (Signed)
Name: [name] MRN: [id number] DOB: 17-Sep-1966 53 y.o. PCP: Medicine, [facility]  Date of Admission: 04/17/2019 10:01 PM Date of Discharge: 04/19/2019 Attending Physician: Dr. Debe Coder  Discharge Diagnosis: 1. Acute on chronic systolic heart failure 2. Cocaine Use disorder   Discharge Medications: Allergies as of 04/19/2019      Reactions   Acetaminophen Hives, Nausea And Vomiting, Rash   Pt overdosed on bottle of APAP 05/06/16   Peanut Butter Flavor Itching, Nausea And Vomiting   Patient doesn't recognize, but it's been in Epic since 2019 (leaving it)   Latex Rash      Medication List    STOP taking these medications   ammonium lactate 12 % lotion Commonly known as:  LAC-HYDRIN   dextromethorphan-guaiFENesin 30-600 MG 12hr tablet Commonly known as:  MUCINEX DM   hydrOXYzine 25 MG tablet Commonly known as:  ATARAX/VISTARIL   lisinopril 40 MG tablet Commonly known as:  ZESTRIL   metoprolol succinate 25 MG 24 hr tablet Commonly known as:  TOPROL-XL   predniSONE 20 MG tablet Commonly known as:  DELTASONE     TAKE these medications   Advair Diskus 250-50 MCG/DOSE Aepb Generic drug:  Fluticasone-Salmeterol Inhale 1 puff into the lungs 2 (two) times daily.   albuterol 108 (90 Base) MCG/ACT inhaler Commonly known as:  VENTOLIN HFA Inhale 1-2 puffs into the lungs every 4 (four) hours as needed for shortness of breath.   amLODipine 10 MG tablet Commonly known as:  NORVASC Take 1 tablet (10 mg total) by mouth daily.   furosemide 40 MG tablet Commonly known as:  Lasix Take 1 tablet (40 mg total) by mouth 2 (two) times daily for 30 days. What changed:  when to take this   ipratropium 0.02 % nebulizer solution Commonly known as:  ATROVENT Take 2.5 mLs (0.5 mg total) by nebulization every 6 (six) hours as needed for wheezing or shortness of breath.       Disposition and follow-up:   [name] was discharged from [facility] in Stable condition.  At the hospital follow up visit please address:  1.  Acute on Chronic Systolic Heart Failure: - Recommendation to resume furosemid 40mg  BID  - Check BMP for electrolyte abnomalities - Check weight (d/c weight 83.8kg [184lbs])  2. Cocaine Use Disorder: - Beta blocker discontinued in setting of cocaine use - Please encourage and assist in stopping his cocaine use  2.  Labs / imaging needed at time of follow-up: BMP  3.  Pending labs/ test needing follow-up: N/A  Follow-up Appointments: Follow-up Information    [facility]. Call.   Contact information: [address] [place] [address] [phone]          Hospital Course by problem list: 1.  Acute on Chronic Systolic Heart Failure: [name] is a 53 yo M w/ PMH of HFrEF, HTN, COPD and cocaine use presenting w/ dyspnea, orthopnea and lower extremity edema 2/2 medication non-adherence in setting of cocaine use. He was found to have BNP of 1600 w/ pulmonary edema on chest X-ray and significant hypervolemia on exam. He was started on IV furosemide 40mg  BID w/ significant urinary output and improvement in both his lower extremity edema and respiratory status. His hypokalemia and hypomagnesia were replete during diuresis and was discharged at his dry weight (83.8kg (184lbs)). Case management provided 30 day supply of oral furosemide and it was given to him at bedside prior to discharge.  2. Cocaine Use Disorder: On admission UDS showed + for cocaine. Encouraged to discontinue cocaine use, which [name] agreed to. Metoprolol and anti-hypertensives discontinued at discharge for concerns of nodal blockage during cocaine use and poor f/u with risk for AKI.  Discharge Vitals:   BP (!) 157/104 (BP Location: Right Arm)   Pulse 90   Temp 98.4 F (36.9 C) (Oral)   Resp 20   Ht 5\' 9"  (1.753 m)   Wt 83.8 kg   SpO2 98%   BMI 27.28 kg/m   Pertinent Labs,  Studies, and Procedures:  BMP Latest Ref Rng & Units 04/19/2019 04/17/2019 04/06/2019  Glucose 70 - 99 mg/dL 92 93 161(W)  BUN 6 - 20 mg/dL 14 17 96(E)  Creatinine 0.61 - 1.24 mg/dL 4.54(U) 9.81(X) 9.14(N)  Sodium 135 - 145 mmol/L 139 138 142  Potassium 3.5 - 5.1 mmol/L 3.4(L) 3.6 3.6  Chloride 98 - 111 mmol/L 103 105 107  CO2 22 - 32 mmol/L 26 22 25   Calcium 8.9 - 10.3 mg/dL 8.2(N) 5.6(O) 1.3(Y)   DG Chest 2 View  IMPRESSION: Gross cardiomegaly with mild diffuse interstitial pulmonary opacity, likely edema. No focal airspace opacity.   Discharge Instructions:  [name]  You came to [place] with complaints of leg swelling and shortness of breath. We have determined that this was caused by your heart failure. Here are our recommendations for you at discharge:  - Please make sure to take furosemide 40mg  twice a day - Try to avoid cocaine use - Please stop taking metoprolol 25mg  daily - Please stop taking lisinopril 40mg  daily - Continue to take your other home medications as prescribed.  Thank you for choosing [facility].   Discharge Instructions    (HEART FAILURE PATIENTS) Call MD:  Anytime you have any of the following symptoms: 1) 3 pound weight gain in 24 hours or 5 pounds in 1 week 2) shortness of breath, with or without a dry hacking cough 3) swelling in the hands, feet or stomach 4) if you have to sleep on extra pillows at night in order to breathe.   Complete by:  As directed    Call MD for:   Complete by:  As directed    Call MD for:  difficulty breathing, headache or visual disturbances   Complete by:  As directed    Call MD for:  extreme fatigue   Complete by:  As directed    Call MD for:  persistant dizziness or light-headedness   Complete by:  As directed    Call MD for:  temperature >100.4   Complete by:  As directed    Diet - low sodium heart healthy   Complete by:  As directed    Increase activity slowly   Complete by:  As directed       Signed: Theotis Barrio, MD 04/21/2019, 7:23 AM   Pager: [phone]

## 2019-04-19 NOTE — Plan of Care (Signed)
Progressing toward goals. 

## 2019-04-19 NOTE — Progress Notes (Signed)
Patient discharged home.  IV removed Left Forearm with the catheter intact. Discharge instructions and prescription given to the patient who verbalized understanding.

## 2019-04-27 ENCOUNTER — Inpatient Hospital Stay: Payer: Self-pay | Admitting: Family Medicine

## 2019-05-12 ENCOUNTER — Emergency Department (HOSPITAL_COMMUNITY): Payer: Medicaid Other

## 2019-05-12 ENCOUNTER — Other Ambulatory Visit: Payer: Self-pay

## 2019-05-12 ENCOUNTER — Encounter (HOSPITAL_COMMUNITY): Payer: Self-pay

## 2019-05-12 ENCOUNTER — Emergency Department (HOSPITAL_COMMUNITY)
Admission: EM | Admit: 2019-05-12 | Discharge: 2019-05-12 | Disposition: A | Discharge status: Home / Self Care | Payer: Medicaid Other | Attending: Emergency Medicine | Admitting: Emergency Medicine

## 2019-05-12 DIAGNOSIS — K047 Periapical abscess without sinus: Secondary | ICD-10-CM | POA: Diagnosis not present

## 2019-05-12 DIAGNOSIS — Z91040 Latex allergy status: Secondary | ICD-10-CM | POA: Diagnosis not present

## 2019-05-12 DIAGNOSIS — F17210 Nicotine dependence, cigarettes, uncomplicated: Secondary | ICD-10-CM | POA: Diagnosis not present

## 2019-05-12 DIAGNOSIS — I110 Hypertensive heart disease with heart failure: Secondary | ICD-10-CM | POA: Insufficient documentation

## 2019-05-12 DIAGNOSIS — J45909 Unspecified asthma, uncomplicated: Secondary | ICD-10-CM | POA: Diagnosis not present

## 2019-05-12 DIAGNOSIS — Z91010 Allergy to peanuts: Secondary | ICD-10-CM | POA: Insufficient documentation

## 2019-05-12 DIAGNOSIS — Z20828 Contact with and (suspected) exposure to other viral communicable diseases: Secondary | ICD-10-CM | POA: Insufficient documentation

## 2019-05-12 DIAGNOSIS — I10 Essential (primary) hypertension: Secondary | ICD-10-CM | POA: Diagnosis not present

## 2019-05-12 DIAGNOSIS — I5022 Chronic systolic (congestive) heart failure: Secondary | ICD-10-CM | POA: Diagnosis not present

## 2019-05-12 DIAGNOSIS — F1410 Cocaine abuse, uncomplicated: Secondary | ICD-10-CM | POA: Insufficient documentation

## 2019-05-12 DIAGNOSIS — Z79899 Other long term (current) drug therapy: Secondary | ICD-10-CM | POA: Insufficient documentation

## 2019-05-12 DIAGNOSIS — R072 Precordial pain: Secondary | ICD-10-CM | POA: Diagnosis present

## 2019-05-12 DIAGNOSIS — R0789 Other chest pain: Secondary | ICD-10-CM

## 2019-05-12 LAB — BASIC METABOLIC PANEL
Anion gap: 12 (ref 5–15)
BUN: 9 mg/dL (ref 6–20)
CO2: 21 mmol/L — ABNORMAL LOW (ref 22–32)
Calcium: 8.8 mg/dL — ABNORMAL LOW (ref 8.9–10.3)
Chloride: 103 mmol/L (ref 98–111)
Creatinine, Ser: 1.35 mg/dL — ABNORMAL HIGH (ref 0.61–1.24)
GFR calc Af Amer: >60 mL/min (ref >60)
GFR calc non Af Amer: >60 mL/min (ref >60)
Glucose, Bld: 109 mg/dL — ABNORMAL HIGH (ref 70–99)
Potassium: 3.4 mmol/L — ABNORMAL LOW (ref 3.5–5.1)
Sodium: 136 mmol/L (ref 135–145)

## 2019-05-12 LAB — TROPONIN I
Troponin I: 0.04 ng/mL (ref <0.03)
Troponin I: 0.03 ng/mL (ref <0.03)

## 2019-05-12 LAB — RAPID URINE DRUG SCREEN, HOSP PERFORMED
Amphetamines: NOT DETECTED
Barbiturates: NOT DETECTED
Benzodiazepines: NOT DETECTED
Cocaine: POSITIVE — AB
Opiates: NOT DETECTED
Tetrahydrocannabinol: NOT DETECTED

## 2019-05-12 LAB — I-STAT TROPONIN, ED: Troponin i, poc: 0.03 ng/mL (ref 0.00–0.08)

## 2019-05-12 LAB — CBC
HCT: 45.1 % (ref 39.0–52.0)
Hemoglobin: 15.3 g/dL (ref 13.0–17.0)
MCH: 32.2 pg (ref 26.0–34.0)
MCHC: 33.9 g/dL (ref 30.0–36.0)
MCV: 94.9 fL (ref 80.0–100.0)
Platelets: 214 10*3/uL (ref 150–400)
RBC: 4.75 MIL/uL (ref 4.22–5.81)
RDW: 15.5 % (ref 11.5–15.5)
WBC: 7.1 10*3/uL (ref 4.0–10.5)
nRBC: 0 % (ref 0.0–0.2)

## 2019-05-12 LAB — BRAIN NATRIURETIC PEPTIDE: B Natriuretic Peptide: 557.7 pg/mL — ABNORMAL HIGH (ref 0.0–100.0)

## 2019-05-12 IMAGING — DX CHEST - 2 VIEW
2 series · 2 of 2 positions shown · non-contrast
Comparison: Two-view chest x-ray [DATE]

CLINICAL DATA: Chest pain for 3 days.  Pain with inspiration.

EXAM:
CHEST - 2 VIEW

[chest pa]
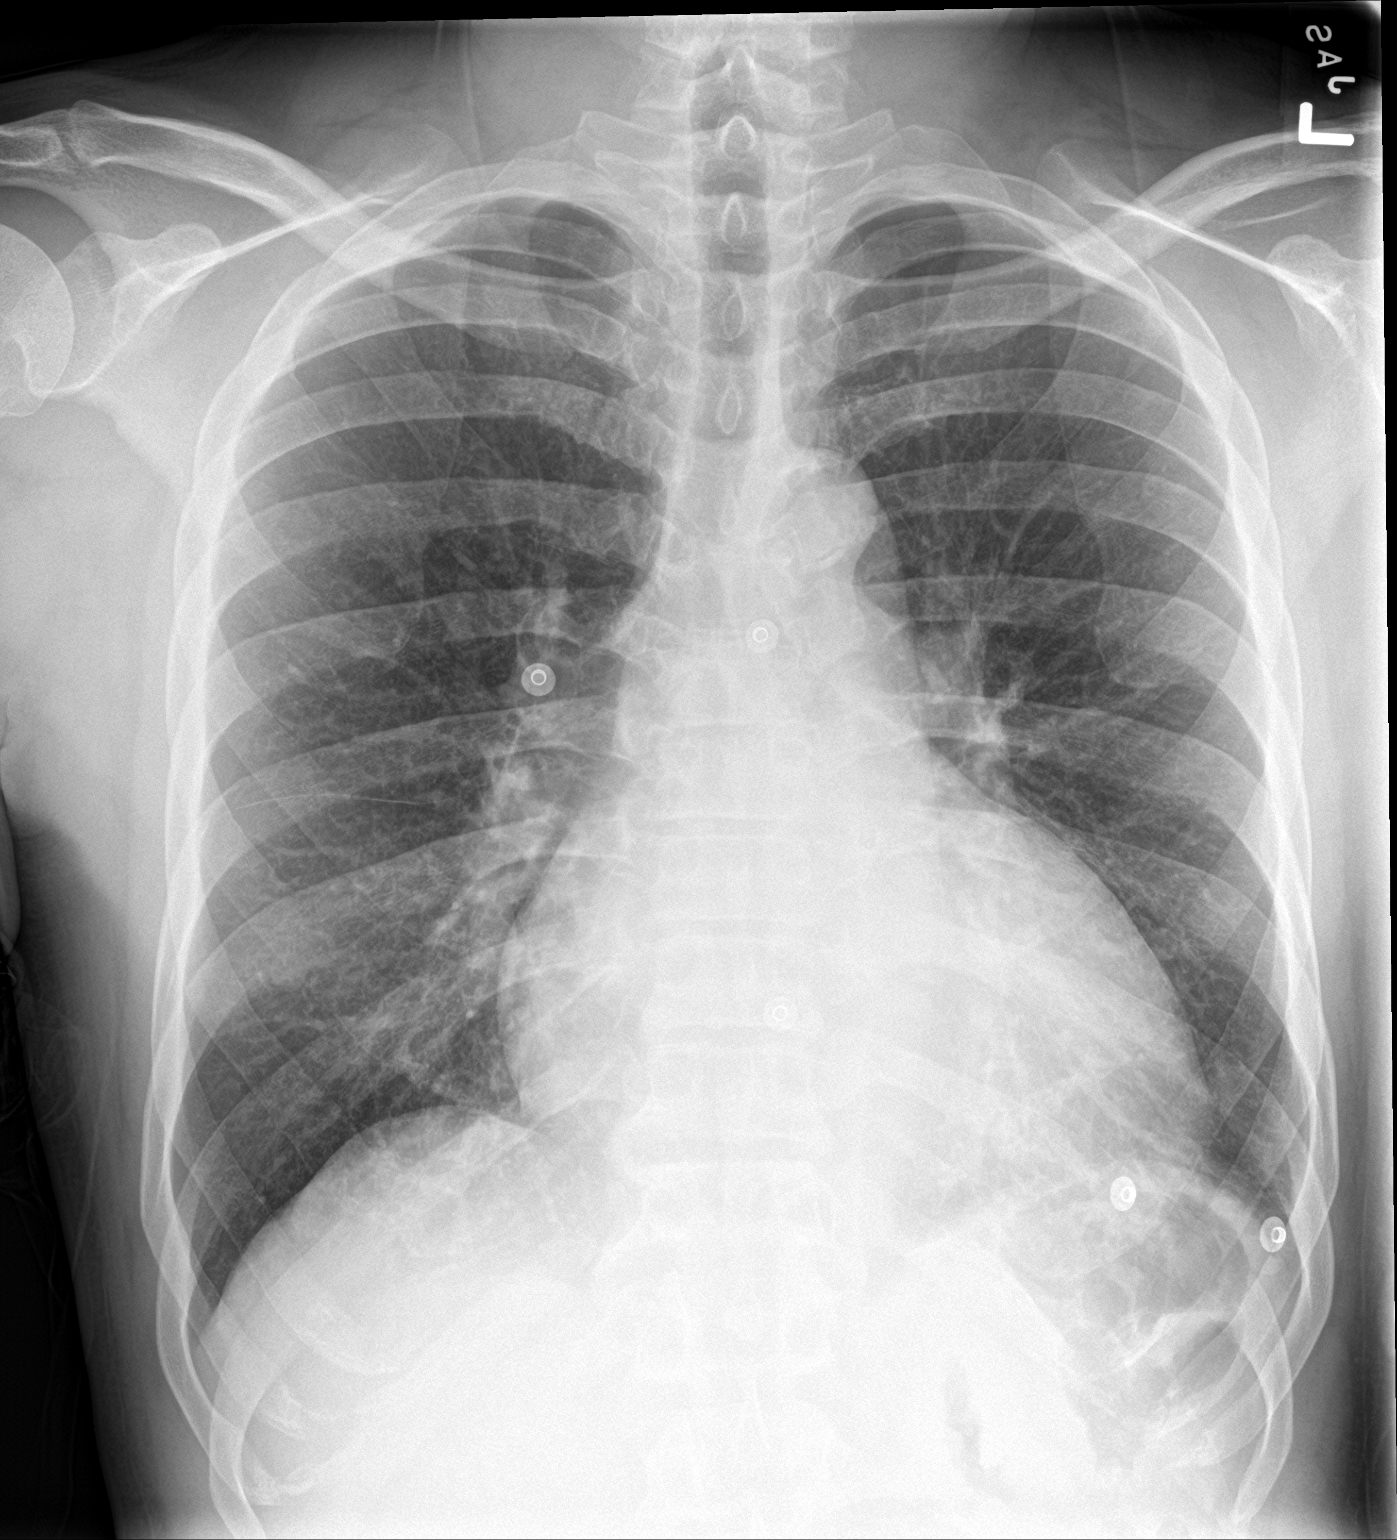

[chest lat]
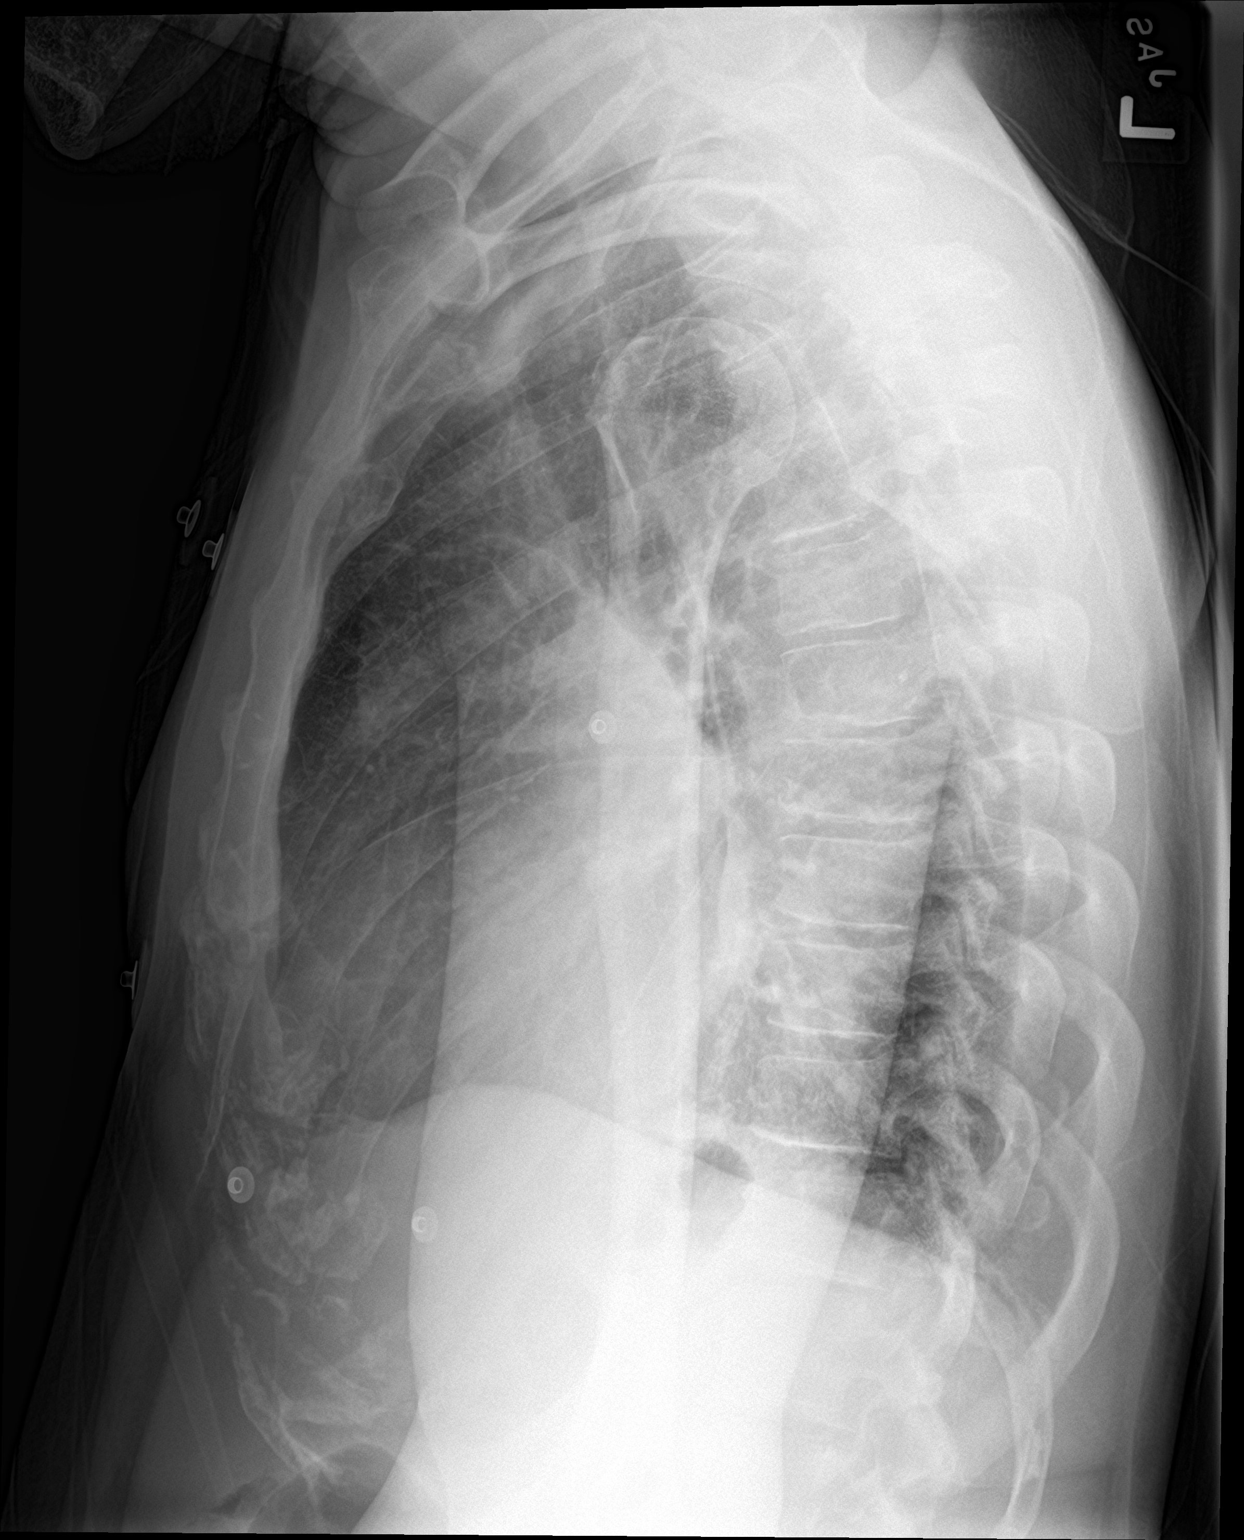

[2 of 2 positions shown; findings below may reference images not displayed]

FINDINGS: The heart is mildly enlarged. There is some hilar fullness.
Ill-defined basilar airspace disease is worse on the left. There is
no edema or effusion. Upper lung fields are clear.
IMPRESSION: 1. Stable cardiomegaly without failure.
2. Mild chronic interstitial prominence.
3. Stable hilar fullness, likely pulmonary.
4. Ill-defined left basilar airspace disease. While this likely
reflects atelectasis, infection is not excluded.

## 2019-05-12 IMAGING — CT CT ANGIOGRAPHY CHEST
2 of 8 series · 19 of 46 positions shown · IV contrast (omnipaque)
Comparison: None.

CLINICAL DATA: PE suspected

EXAM:
CT ANGIOGRAPHY CHEST WITH CONTRAST
TECHNIQUE: Multidetector CT imaging of the chest was performed using the
standard protocol during bolus administration of intravenous
contrast. Multiplanar CT image reconstructions and MIPs were
obtained to evaluate the vascular anatomy.
CONTRAST:  100mL OMNIPAQUE IOHEXOL 350 MG/ML SOLN

[Series 6: thins · axial · 0.70mm/px · z∈[+1200,+1451]mm · 16 of 277 slices shown]
[im 13/277  lung]
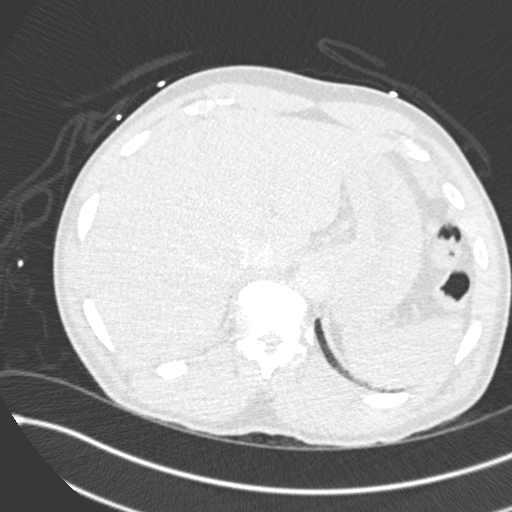
[im 26/277  soft-tissue]
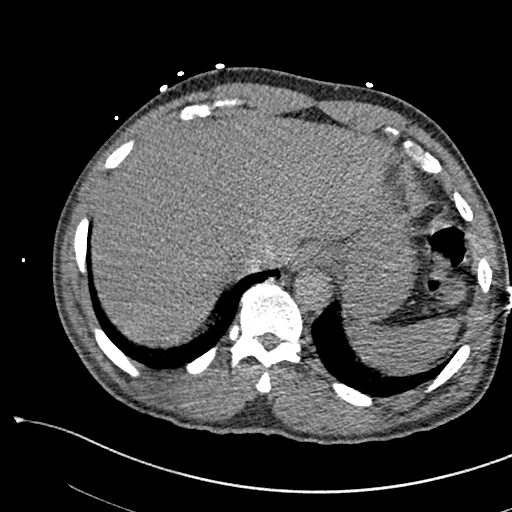
[im 51/277  lung]
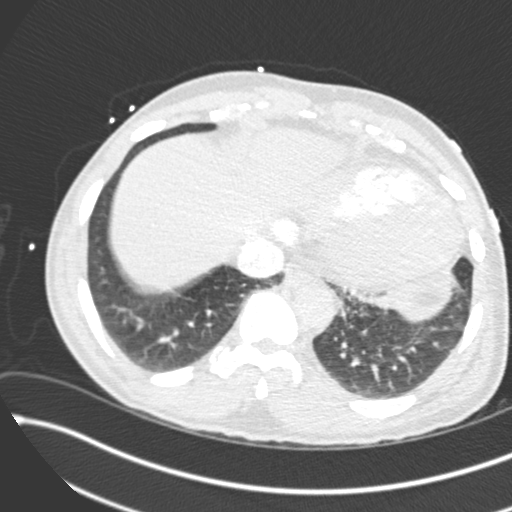
[im 63/277  soft-tissue]
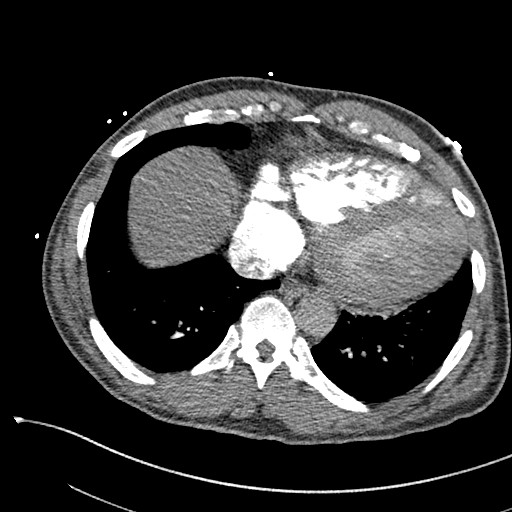
[im 76/277  lung]
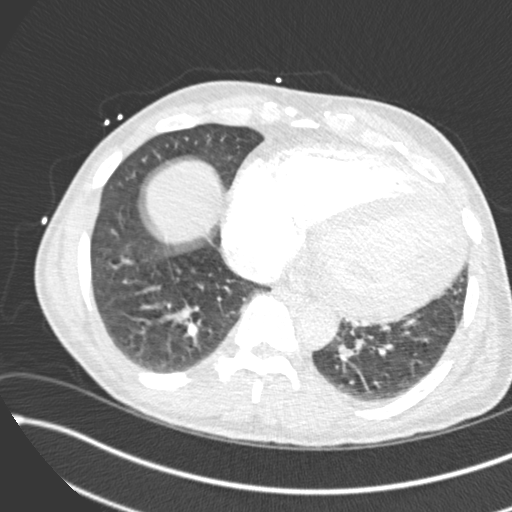
[im 101/277  soft-tissue]
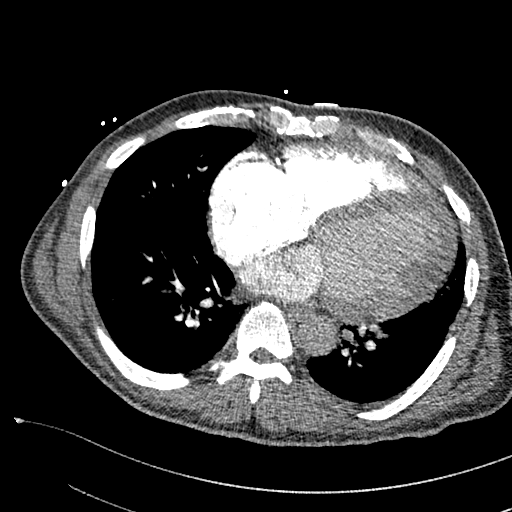
[im 113/277  lung]
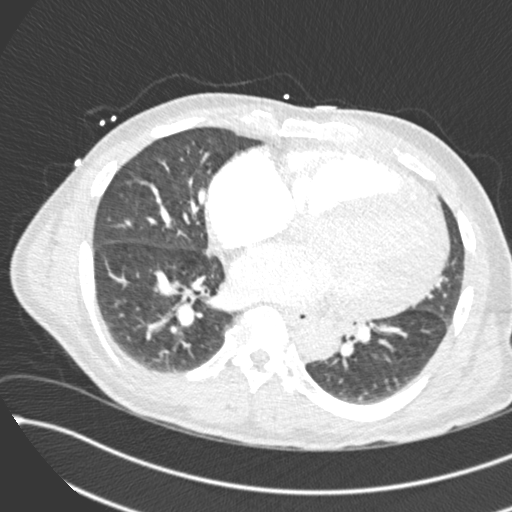
[im 126/277  soft-tissue]
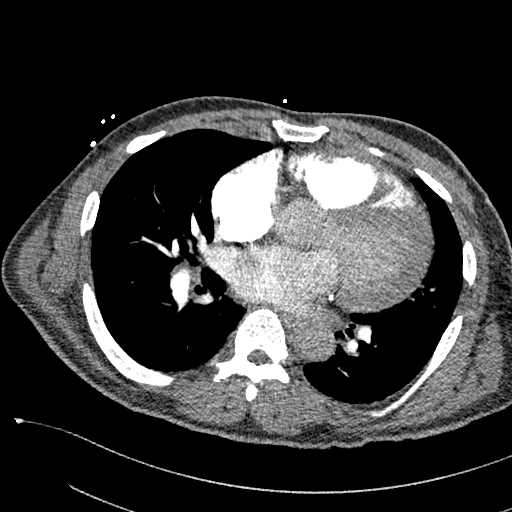
[im 151/277  lung]
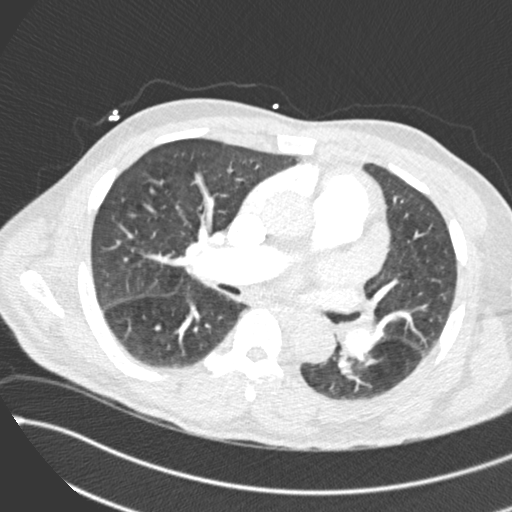
[im 164/277  soft-tissue]
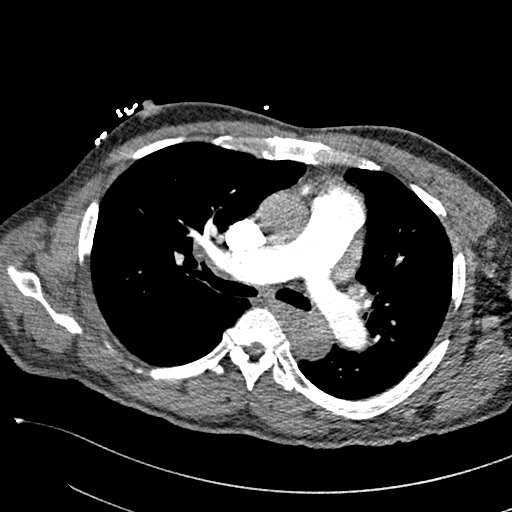
[im 176/277  lung]
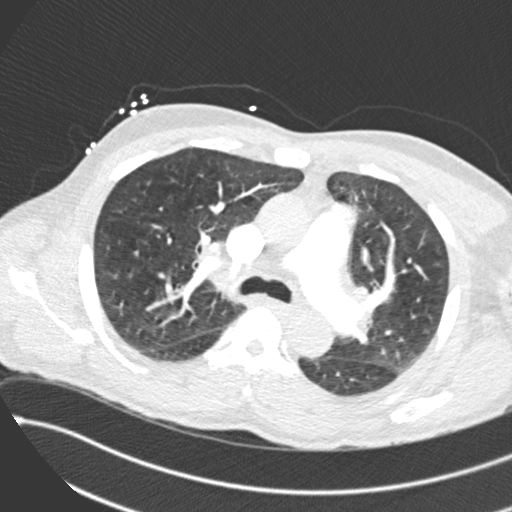
[im 201/277  soft-tissue]
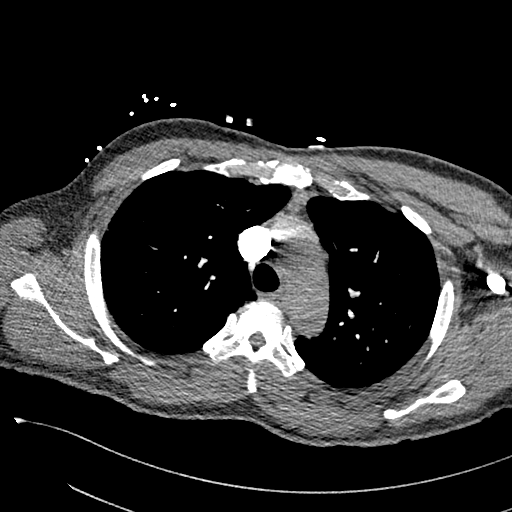
[im 214/277  lung]
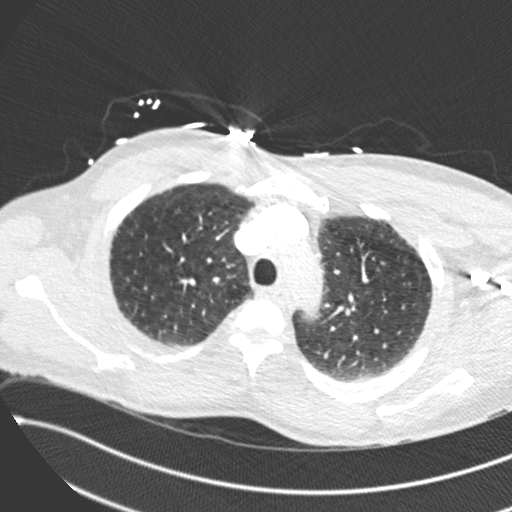
[im 226/277  soft-tissue]
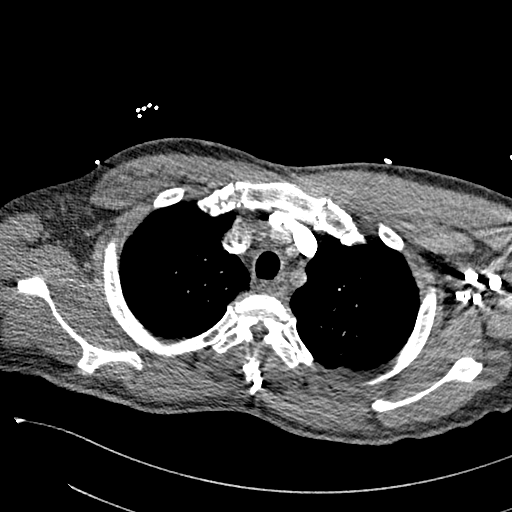
[im 251/277  lung]
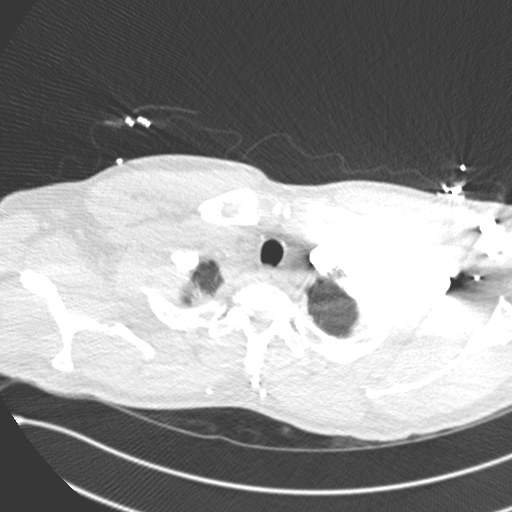
[im 264/277  soft-tissue]
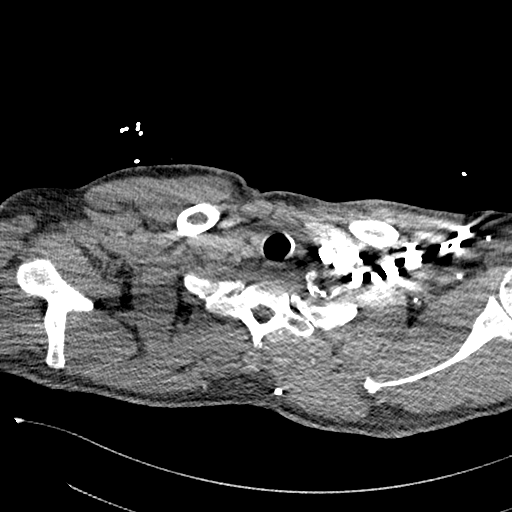

[Series 8: coronal mpr · coronal · 0.59mm/px · 3 of 147 slices shown]
[im 37/147  soft-tissue]
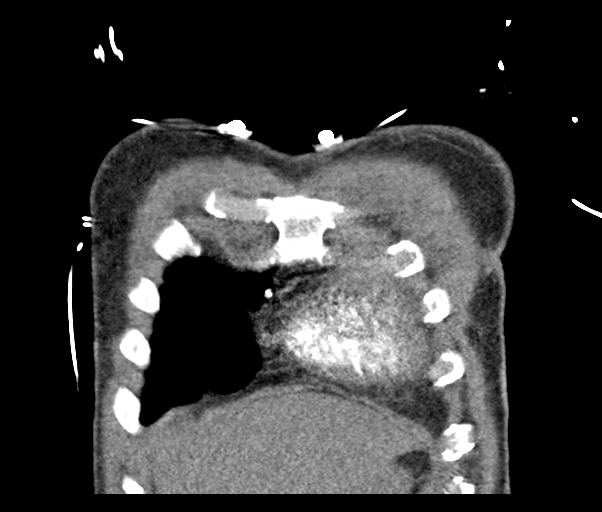
[im 74/147  soft-tissue]
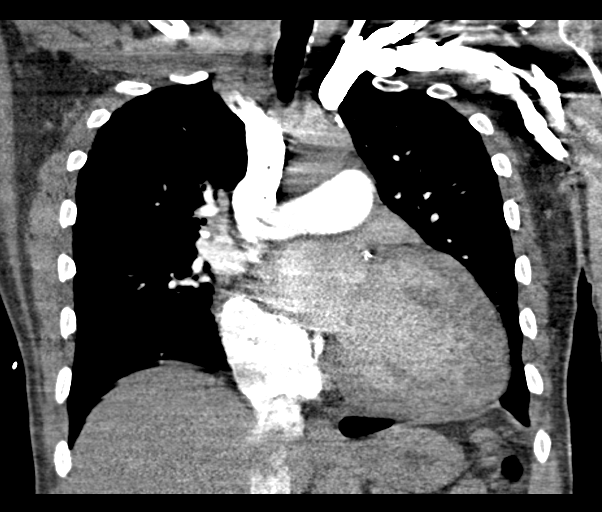
[im 110/147  soft-tissue]
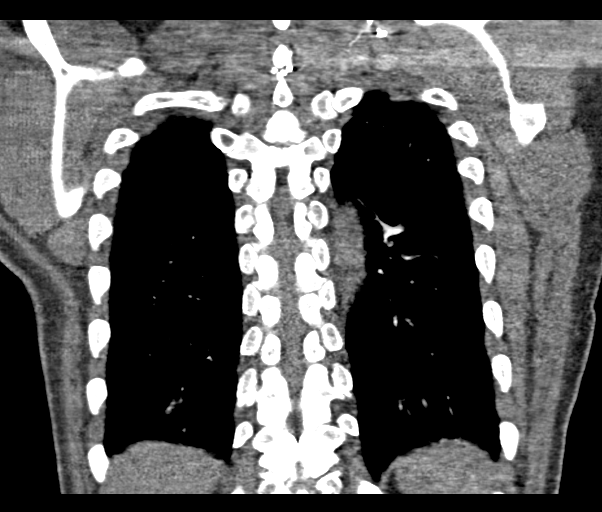

[19 of 46 positions shown; findings below may reference images not displayed]

FINDINGS: Cardiovascular: Satisfactory opacification of the pulmonary arteries
to the segmental level. No evidence of pulmonary embolism.
Cardiomegaly. Three-vessel coronary artery calcifications. No
pericardial effusion.

Mediastinum/Nodes: No enlarged mediastinal, hilar, or axillary lymph
nodes. Thyroid gland, trachea, and esophagus demonstrate no
significant findings.

Lungs/Pleura: Lungs are clear. No pleural effusion or pneumothorax.

Upper Abdomen: No acute abnormality.

Musculoskeletal: No chest wall abnormality. No acute or significant
osseous findings.

Review of the MIP images confirms the above findings.
IMPRESSION: 1.  Negative examination for pulmonary embolism.

2.  Cardiomegaly and coronary artery disease.

3. For exam request, no CT abnormality of the left sternoclavicular
joint.

## 2019-05-12 MED ORDER — SODIUM CHLORIDE 0.9 % IV SOLN: 500.0000 mg | Freq: Once | INTRAVENOUS | Status: DC

## 2019-05-12 MED ORDER — POTASSIUM CHLORIDE CRYS ER 20 MEQ PO TBCR
40.0000 meq | EXTENDED_RELEASE_TABLET | Freq: Once | ORAL | Status: AC
  Administered 2019-05-12: 40 meq via ORAL
  Filled 2019-05-12: qty 2

## 2019-05-12 MED ORDER — AMOXICILLIN 500 MG PO CAPS: 500.0000 mg | ORAL_CAPSULE | Freq: Three times a day (TID) | ORAL | 0 refills | Status: DC

## 2019-05-12 MED ORDER — FUROSEMIDE 40 MG PO TABS: 40.0000 mg | ORAL_TABLET | Freq: Two times a day (BID) | ORAL | 0 refills | Status: DC

## 2019-05-12 MED ORDER — POTASSIUM CHLORIDE CRYS ER 20 MEQ PO TBCR: 20.0000 meq | EXTENDED_RELEASE_TABLET | Freq: Every day | ORAL | 0 refills | Status: DC

## 2019-05-12 MED ORDER — IBUPROFEN 400 MG PO TABS
400.0000 mg | ORAL_TABLET | Freq: Once | ORAL | Status: AC
  Administered 2019-05-12: 400 mg via ORAL
  Filled 2019-05-12: qty 1

## 2019-05-12 MED ORDER — HYDROMORPHONE HCL 1 MG/ML IJ SOLN
1.0000 mg | Freq: Once | INTRAMUSCULAR | Status: AC
  Administered 2019-05-12: 1 mg via INTRAVENOUS
  Filled 2019-05-12: qty 1

## 2019-05-12 MED ORDER — IOHEXOL 350 MG/ML SOLN
100.0000 mL | Freq: Once | INTRAVENOUS | Status: AC | PRN
  Administered 2019-05-12: 100 mL via INTRAVENOUS

## 2019-05-12 MED ORDER — AMOXICILLIN 500 MG PO CAPS
1000.0000 mg | ORAL_CAPSULE | Freq: Once | ORAL | Status: AC
  Administered 2019-05-12: 1000 mg via ORAL
  Filled 2019-05-12: qty 2

## 2019-05-12 NOTE — ED Triage Notes (Signed)
Pt c/o CP x 3 days. C/o chest pain with inspiration.

## 2019-05-12 NOTE — ED Provider Notes (Addendum)
[facility] EMERGENCY DEPARTMENT Provider Note   CSN: [id number] Arrival date & time: 05/12/19  0251     History   Chief Complaint Chief Complaint  Patient presents with   Chest Pain    HPI Nahun Cyphert is a 53 y.o. male.     Patient c/o pain to left upper chest for the past 3 days. Symptoms acute onset, moderate, dull, constant, non radiating, worse w deep breath, cough, movement left shoulder. Denies hx same pain. No sob. Occasional non prod cough. No sore throat or runny nose. No fever/chills/sweats. Denies known covid + exposure. No leg pain or swelling. No recent surgery, trauma, travel or immobility. No hx dvt or pe. Denies fam hx premature cad. Denies chest wall injury or strain. No new activity. Occasional cocaine use. No ivda.   The history is provided by the patient.  Chest Pain Associated symptoms: cough   Associated symptoms: no abdominal pain, no back pain, no fever, no headache, no numbness, no palpitations, no shortness of breath, no vomiting and no weakness     Past Medical History:  Diagnosis Date   Asthma    Bipolar 1 disorder (HCC)    CHF (congestive heart failure) (HCC)    Depression    Homelessness    Hypertension    Schizoaffective disorder [facility])     Patient Active Problem List   Diagnosis Date Noted   Acute on chronic systolic heart failure (HCC) 04/18/2019   Bilateral foot pain 04/05/2019   Acute on chronic clinical systolic heart failure (HCC) 04/04/2019   Housing situation unstable 03/28/2019   AKI (acute kidney injury) (HCC)    Bilateral lower extremity edema    Acute on chronic HFrEF (heart failure with reduced ejection fraction) (HCC) 03/27/2019   COPD exacerbation (HCC)    Acute exacerbation of CHF (congestive heart failure) (HCC) 03/04/2019   Asthma 12/26/2016   Tobacco use disorder 12/26/2016   Schizophrenia (HCC) 12/26/2016   Cocaine abuse with cocaine-induced mood disorder (HCC) 09/06/2016     Alcohol use disorder, mild, abuse 07/31/2016   Substance induced mood disorder (HCC) 07/30/2016   Schizoaffective disorder, bipolar type (HCC) 05/31/2016   Cocaine use disorder, severe, dependence (HCC) 05/31/2016   Essential hypertension 05/31/2016    History reviewed. No pertinent surgical history.      Home Medications    Prior to Admission medications   Medication Sig Start Date End Date Taking? Authorizing Provider  ADVAIR DISKUS 250-50 MCG/DOSE AEPB Inhale 1 puff into the lungs 2 (two) times daily. 07/13/18   [provider]  albuterol (PROVENTIL HFA;VENTOLIN HFA) 108 (90 Base) MCG/ACT inhaler Inhale 1-2 puffs into the lungs every 4 (four) hours as needed for shortness of breath. 08/22/18   Dione Booze, MD  amLODipine (NORVASC) 10 MG tablet Take 1 tablet (10 mg total) by mouth daily. 04/06/19   Ali Lowe, MD  furosemide (LASIX) 40 MG tablet Take 1 tablet (40 mg total) by mouth 2 (two) times daily for 30 days. 04/19/19 05/19/19  Theotis Barrio, MD  ipratropium (ATROVENT) 0.02 % nebulizer solution Take 2.5 mLs (0.5 mg total) by nebulization every 6 (six) hours as needed for wheezing or shortness of breath. 03/09/19   Claudean Severance, MD    Family History Family History  Problem Relation Age of Onset   Drug abuse Mother    Suicidality Mother     Social History Social History   Tobacco Use   Smoking status: Current Every Day Smoker  Years: 30.00    Types: Cigarettes   Smokeless tobacco: Never Used  Substance Use Topics   Alcohol use: Yes    Comment: infrequently   Drug use: Yes    Types: Cocaine, Marijuana     Allergies   Acetaminophen, Peanut butter flavor, and Latex   Review of Systems Review of Systems  Constitutional: Negative for chills and fever.  HENT: Negative for sore throat.   Eyes: Negative for redness.  Respiratory: Positive for cough. Negative for shortness of breath.   Cardiovascular: Positive for chest pain. Negative  for palpitations and leg swelling.  Gastrointestinal: Negative for abdominal pain, diarrhea and vomiting.  Genitourinary: Negative for flank pain.  Musculoskeletal: Negative for back pain and neck pain.  Skin: Negative for rash.  Neurological: Negative for weakness, numbness and headaches.  Hematological: Does not bruise/bleed easily.  Psychiatric/Behavioral: Negative for confusion.     Physical Exam Updated Vital Signs BP (!) 158/120 (BP Location: Left Arm)    Pulse 99    Temp 99.5 F (37.5 C) (Oral)    Resp (!) 22    Ht 1.753 m (5\' 9" )    Wt 83.9 kg    SpO2 100%    BMI 27.32 kg/m   Physical Exam Vitals signs and nursing note reviewed.  Constitutional:      Appearance: Normal appearance. He is well-developed.  HENT:     Head: Atraumatic.     Nose: Nose normal.     Mouth/Throat:     Mouth: Mucous membranes are moist.     Pharynx: Oropharynx is clear.     Comments: Tooth 12 with decay, mild gum swelling - ?early/mild dental abscess.  Eyes:     General: No scleral icterus.    Conjunctiva/sclera: Conjunctivae normal.  Neck:     Musculoskeletal: Normal range of motion and neck supple. No neck rigidity.     Trachea: No tracheal deviation.  Cardiovascular:     Rate and Rhythm: Normal rate and regular rhythm.     Pulses: Normal pulses.     Heart sounds: Normal heart sounds. No murmur. No friction rub. No gallop.   Pulmonary:     Effort: Pulmonary effort is normal. No accessory muscle usage or respiratory distress.     Breath sounds: Normal breath sounds.     Comments: Left upper chest tenderness, esp at left sternoclavicular area. No mass felt. No l/a. No fluctuance/abscess. No crepitus.  Chest:     Chest wall: Tenderness present.  Abdominal:     General: Bowel sounds are normal. There is no distension.     Palpations: Abdomen is soft.     Tenderness: There is no abdominal tenderness. There is no guarding.  Genitourinary:    Comments: No cva tenderness. Musculoskeletal:         General: No swelling or tenderness.     Right lower leg: No edema.     Left lower leg: No edema.     Comments: Good rom left shoulder without pain. Distal pulses palp bil.   Skin:    General: Skin is warm and dry.     Findings: No rash.     Comments: No skin changes, lesions, warmth or erythema to area of pain.   Neurological:     Mental Status: He is alert.     Comments: Alert, speech clear. Motor/sens grossly intact bil. Steady gait.   Psychiatric:        Mood and Affect: Mood normal.  ED Treatments / Results  Labs (all labs ordered are listed, but only abnormal results are displayed) Results for orders placed or performed during the hospital encounter of 29/51/88  Basic metabolic panel  Result Value Ref Range   Sodium 136 135 - 145 mmol/L   Potassium 3.4 (L) 3.5 - 5.1 mmol/L   Chloride 103 98 - 111 mmol/L   CO2 21 (L) 22 - 32 mmol/L   Glucose, Bld 109 (H) 70 - 99 mg/dL   BUN 9 6 - 20 mg/dL   Creatinine, Ser 1.35 (H) 0.61 - 1.24 mg/dL   Calcium 8.8 (L) 8.9 - 10.3 mg/dL   GFR calc non Af Amer >60 >60 mL/min   GFR calc Af Amer >60 >60 mL/min   Anion gap 12 5 - 15  CBC  Result Value Ref Range   WBC 7.1 4.0 - 10.5 K/uL   RBC 4.75 4.22 - 5.81 MIL/uL   Hemoglobin 15.3 13.0 - 17.0 g/dL   HCT 45.1 39.0 - 52.0 %   MCV 94.9 80.0 - 100.0 fL   MCH 32.2 26.0 - 34.0 pg   MCHC 33.9 30.0 - 36.0 g/dL   RDW 15.5 11.5 - 15.5 %   Platelets 214 150 - 400 K/uL   nRBC 0.0 0.0 - 0.2 %  Troponin I - ONCE - STAT  Result Value Ref Range   Troponin I 0.04 (HH) <0.03 ng/mL  Troponin I - ONCE - STAT  Result Value Ref Range   Troponin I 0.03 (HH) <0.03 ng/mL  Brain natriuretic peptide  Result Value Ref Range   B Natriuretic Peptide 557.7 (H) 0.0 - 100.0 pg/mL  Rapid urine drug screen (hospital performed)  Result Value Ref Range   Opiates NONE DETECTED NONE DETECTED   Cocaine POSITIVE (A) NONE DETECTED   Benzodiazepines NONE DETECTED NONE DETECTED   Amphetamines NONE DETECTED  NONE DETECTED   Tetrahydrocannabinol NONE DETECTED NONE DETECTED   Barbiturates NONE DETECTED NONE DETECTED  I-stat troponin, ED  Result Value Ref Range   Troponin i, poc 0.03 0.00 - 0.08 ng/mL   Comment 3           Dg Chest 2 View  Result Date: 05/12/2019 CLINICAL DATA:  Chest pain for 3 days.  Pain with inspiration. EXAM: CHEST - 2 VIEW COMPARISON:  Two-view chest x-ray 04/17/2019 FINDINGS: The heart is mildly enlarged. There is some hilar fullness. Ill-defined basilar airspace disease is worse on the left. There is no edema or effusion. Upper lung fields are clear. IMPRESSION: 1. Stable cardiomegaly without failure. 2. Mild chronic interstitial prominence. 3. Stable hilar fullness, likely pulmonary. 4. Ill-defined left basilar airspace disease. While this likely reflects atelectasis, infection is not excluded. Electronically Signed   By: San Morelle M.D.   On: 05/12/2019 04:22   Dg Chest 2 View  Result Date: 04/17/2019 CLINICAL DATA:  Shortness of breath EXAM: CHEST - 2 VIEW COMPARISON:  04/04/2019 FINDINGS: Gross cardiomegaly. Mild diffuse interstitial pulmonary opacity. The visualized skeletal structures are unremarkable. IMPRESSION: Gross cardiomegaly with mild diffuse interstitial pulmonary opacity, likely edema. No focal airspace opacity. Electronically Signed   By: Eddie Candle M.D.   On: 04/17/2019 23:24   Ct Angio Chest Pe W/cm &/or Wo Cm  Result Date: 05/12/2019 CLINICAL DATA:  PE suspected EXAM: CT ANGIOGRAPHY CHEST WITH CONTRAST TECHNIQUE: Multidetector CT imaging of the chest was performed using the standard protocol during bolus administration of intravenous contrast. Multiplanar CT image reconstructions and MIPs were obtained to evaluate  the vascular anatomy. CONTRAST:  100mL OMNIPAQUE IOHEXOL 350 MG/ML SOLN COMPARISON:  None. FINDINGS: Cardiovascular: Satisfactory opacification of the pulmonary arteries to the segmental level. No evidence of pulmonary embolism.  Cardiomegaly. Three-vessel coronary artery calcifications. No pericardial effusion. Mediastinum/Nodes: No enlarged mediastinal, hilar, or axillary lymph nodes. Thyroid gland, trachea, and esophagus demonstrate no significant findings. Lungs/Pleura: Lungs are clear. No pleural effusion or pneumothorax. Upper Abdomen: No acute abnormality. Musculoskeletal: No chest wall abnormality. No acute or significant osseous findings. Review of the MIP images confirms the above findings. IMPRESSION: 1.  Negative examination for pulmonary embolism. 2.  Cardiomegaly and coronary artery disease. 3. For exam request, no CT abnormality of the left sternoclavicular joint. Electronically Signed   By: Lauralyn PrimesAlex  Bibbey M.D.   On: 05/12/2019 08:46    EKG EKG Interpretation  Date/Time:  Thursday May 12 2019 02:57:00 EDT Ventricular Rate:  102 PR Interval:  160 QRS Duration: 90 QT Interval:  408 QTC Calculation: 531 R Axis:   51 Text Interpretation:  Sinus tachycardia Biatrial enlargement Left ventricular hypertrophy ST & T wave abnormality, consider lateral ischemia Prolonged QT Abnormal ECG Confirmed by Geoffery LyonseLo, Douglas ([id number]) on 05/12/2019 3:02:07 AM   Radiology Dg Chest 2 View  Result Date: 05/12/2019 CLINICAL DATA:  Chest pain for 3 days.  Pain with inspiration. EXAM: CHEST - 2 VIEW COMPARISON:  Two-view chest x-ray 04/17/2019 FINDINGS: The heart is mildly enlarged. There is some hilar fullness. Ill-defined basilar airspace disease is worse on the left. There is no edema or effusion. Upper lung fields are clear. IMPRESSION: 1. Stable cardiomegaly without failure. 2. Mild chronic interstitial prominence. 3. Stable hilar fullness, likely pulmonary. 4. Ill-defined left basilar airspace disease. While this likely reflects atelectasis, infection is not excluded. Electronically Signed   By: Marin Robertshristopher  Mattern M.D.   On: 05/12/2019 04:22    Procedures Procedures (including critical care time)  Medications Ordered in  ED Medications  HYDROmorphone (DILAUDID) injection 1 mg (has no administration in time range)     Initial Impression / Assessment and Plan / ED Course  I have reviewed the triage vital signs and the nursing notes.  Pertinent labs & imaging results that were available during my care of the patient were reviewed by me and considered in my medical decision making (see chart for details).  Iv ns. Labs. Cxr.   Reviewed nursing notes and prior charts for additional history.   Dilaudid iv.   Recheck pain improved.   With palpation left sternoclavicular area pt with pain. No warmth, abscess, fluctuance or overt external sign of infection. Discussed diff dx incl inflammation, arthritis, etc - discussed to watch, if fever, increased pain/swelling, redness, return for recheck.   Labs reviewed by me - after symptoms for past couple days, repeat/delta troponin is negative/normal, and not increasing.   CT reviewed by me - no PE. Lungs clear.  Pt w occasional cough. Will sent covid test. No hypoxia or increased wob.    Pt requests antibiotic for possible dental pain/?dental abscess. No trismus. No swelling to floor of mouth or throat. amox po. Confirmed no known antibiotic allergy.   Motrin po.   Recheck pt, pt has drank fluids, eaten meal. Is resting comfortably. No increased wob. Pt indicates he does have adequate bp meds at home, has not yet taken today.    Given coronary calc on ct - will have f/u card as outpt. Discussed w pt. Also discussed risks/importance of avoiding cocaine use.   Pt denies cp or sob  on recheck.   Pt currently appears stable for d/c.   rec pcp f/u, including for recheck bp.   Return precautions provided.   At d/c, requests refill/new rx for lasix - provided.     Final Clinical Impressions(s) / ED Diagnoses   Final diagnoses:  None    ED Discharge Orders    None         Cathren LaineSteinl, Henryk Ursin, MD 05/12/19 1044

## 2019-05-12 NOTE — ED Notes (Signed)
I attempted to go over d/c instructions, but pt refused to allow me to explain things to him. He told me to "just give him the paper work."

## 2019-05-12 NOTE — Discharge Instructions (Addendum)
It was our pleasure to provide your ER care today - we hope that you feel better.  Take antibiotic (amoxicillin) as prescribed.   Take motrin or aleve as need for pain.   Your blood pressure is high - continue your blood pressure medication, limit salt intake, avoid any cocaine use, and follow up with primary care doctor in the coming week. From today's lab tests, your potassium level is mildly low - eat plenty of fruits and vegetables, take potassium supplement as prescribed, and follow up with primary care doctor in 1-2 weeks.   For recheck chest discomfort and calcifications on CT - follow up with cardiologist in 1 week - call office to arrange appointment.   For dental issue - take antibiotic and follow up with dentist in the coming week.  Return to ER if worse, new symptoms, fevers, worsening or severe pain, increased trouble breathing, other concern.   We sent a covid test the results of which will be back in 1-2 days - call in 2 days to get those test results.  You were given pain medication in the ER - no driving for the next 6 hours.

## 2019-05-12 NOTE — ED Notes (Signed)
Pt aware a urine sample is needed. Urinal at bedside. 

## 2019-05-13 LAB — NOVEL CORONAVIRUS, NAA (HOSP ORDER, SEND-OUT TO REF LAB; TAT 18-24 HRS): SARS-CoV-2, NAA: NOT DETECTED

## 2019-05-24 ENCOUNTER — Emergency Department (HOSPITAL_COMMUNITY): Payer: Medicaid Other

## 2019-05-24 ENCOUNTER — Inpatient Hospital Stay (HOSPITAL_COMMUNITY)
Admission: EM | Admit: 2019-05-24 | Discharge: 2019-05-26 | DRG: 292 | Disposition: A | Discharge status: Home / Self Care | Payer: Medicaid Other | Attending: Family Medicine | Admitting: Family Medicine

## 2019-05-24 ENCOUNTER — Other Ambulatory Visit: Payer: Self-pay

## 2019-05-24 ENCOUNTER — Encounter (HOSPITAL_COMMUNITY): Payer: Self-pay | Admitting: Emergency Medicine

## 2019-05-24 DIAGNOSIS — R7989 Other specified abnormal findings of blood chemistry: Secondary | ICD-10-CM

## 2019-05-24 DIAGNOSIS — F1410 Cocaine abuse, uncomplicated: Secondary | ICD-10-CM

## 2019-05-24 DIAGNOSIS — Z5900 Homelessness unspecified: Secondary | ICD-10-CM | POA: Insufficient documentation

## 2019-05-24 DIAGNOSIS — F17210 Nicotine dependence, cigarettes, uncomplicated: Secondary | ICD-10-CM | POA: Diagnosis present

## 2019-05-24 DIAGNOSIS — R079 Chest pain, unspecified: Secondary | ICD-10-CM | POA: Diagnosis present

## 2019-05-24 DIAGNOSIS — I248 Other forms of acute ischemic heart disease: Secondary | ICD-10-CM | POA: Diagnosis present

## 2019-05-24 DIAGNOSIS — R45851 Suicidal ideations: Secondary | ICD-10-CM | POA: Diagnosis present

## 2019-05-24 DIAGNOSIS — R778 Other specified abnormalities of plasma proteins: Secondary | ICD-10-CM | POA: Insufficient documentation

## 2019-05-24 DIAGNOSIS — Z1159 Encounter for screening for other viral diseases: Secondary | ICD-10-CM

## 2019-05-24 DIAGNOSIS — J449 Chronic obstructive pulmonary disease, unspecified: Secondary | ICD-10-CM | POA: Diagnosis present

## 2019-05-24 DIAGNOSIS — Z9114 Patient's other noncompliance with medication regimen: Secondary | ICD-10-CM

## 2019-05-24 DIAGNOSIS — I110 Hypertensive heart disease with heart failure: Principal | ICD-10-CM | POA: Diagnosis present

## 2019-05-24 DIAGNOSIS — Z79899 Other long term (current) drug therapy: Secondary | ICD-10-CM

## 2019-05-24 DIAGNOSIS — R9431 Abnormal electrocardiogram [ECG] [EKG]: Secondary | ICD-10-CM

## 2019-05-24 DIAGNOSIS — K047 Periapical abscess without sinus: Secondary | ICD-10-CM | POA: Diagnosis present

## 2019-05-24 DIAGNOSIS — I509 Heart failure, unspecified: Secondary | ICD-10-CM

## 2019-05-24 DIAGNOSIS — F250 Schizoaffective disorder, bipolar type: Secondary | ICD-10-CM | POA: Diagnosis present

## 2019-05-24 DIAGNOSIS — R0789 Other chest pain: Secondary | ICD-10-CM

## 2019-05-24 DIAGNOSIS — I5023 Acute on chronic systolic (congestive) heart failure: Secondary | ICD-10-CM | POA: Diagnosis present

## 2019-05-24 HISTORY — DX: Abnormal electrocardiogram (ECG) (EKG): R94.31

## 2019-05-24 LAB — RAPID URINE DRUG SCREEN, HOSP PERFORMED
Amphetamines: NOT DETECTED
Barbiturates: NOT DETECTED
Benzodiazepines: NOT DETECTED
Cocaine: POSITIVE — AB
Opiates: NOT DETECTED
Tetrahydrocannabinol: NOT DETECTED

## 2019-05-24 LAB — CBC
HCT: 42.4 % (ref 39.0–52.0)
Hemoglobin: 14.1 g/dL (ref 13.0–17.0)
MCH: 32.9 pg (ref 26.0–34.0)
MCHC: 33.3 g/dL (ref 30.0–36.0)
MCV: 99.1 fL (ref 80.0–100.0)
Platelets: 263 10*3/uL (ref 150–400)
RBC: 4.28 MIL/uL (ref 4.22–5.81)
RDW: 16.2 % — ABNORMAL HIGH (ref 11.5–15.5)
WBC: 5.7 10*3/uL (ref 4.0–10.5)
nRBC: 0 % (ref 0.0–0.2)

## 2019-05-24 LAB — BASIC METABOLIC PANEL
Anion gap: 11 (ref 5–15)
BUN: 11 mg/dL (ref 6–20)
CO2: 24 mmol/L (ref 22–32)
Calcium: 8.9 mg/dL (ref 8.9–10.3)
Chloride: 104 mmol/L (ref 98–111)
Creatinine, Ser: 1.47 mg/dL — ABNORMAL HIGH (ref 0.61–1.24)
GFR calc Af Amer: >60 mL/min (ref >60)
GFR calc non Af Amer: 54 mL/min — ABNORMAL LOW (ref >60)
Glucose, Bld: 87 mg/dL (ref 70–99)
Potassium: 3.6 mmol/L (ref 3.5–5.1)
Sodium: 139 mmol/L (ref 135–145)

## 2019-05-24 LAB — SALICYLATE LEVEL: Salicylate Lvl: <7 mg/dL (ref 2.8–30.0)

## 2019-05-24 LAB — TROPONIN I: Troponin I: 0.05 ng/mL (ref <0.03)

## 2019-05-24 LAB — SARS CORONAVIRUS 2 BY RT PCR (HOSPITAL ORDER, PERFORMED IN ~~LOC~~ HOSPITAL LAB): SARS Coronavirus 2: NEGATIVE

## 2019-05-24 LAB — ETHANOL: Alcohol, Ethyl (B): <10 mg/dL (ref <10)

## 2019-05-24 LAB — ACETAMINOPHEN LEVEL: Acetaminophen (Tylenol), Serum: <10 ug/mL — ABNORMAL LOW (ref 10–30)

## 2019-05-24 LAB — BRAIN NATRIURETIC PEPTIDE: B Natriuretic Peptide: 1686.7 pg/mL — ABNORMAL HIGH (ref 0.0–100.0)

## 2019-05-24 IMAGING — DX PORTABLE CHEST - 1 VIEW
1 series · 1 of 1 positions shown · non-contrast
Comparison: CT [DATE]. Chest x-ray [DATE], [DATE],
[DATE].

CLINICAL DATA: Shortness of breath.

EXAM:
PORTABLE CHEST 1 VIEW

[chest]
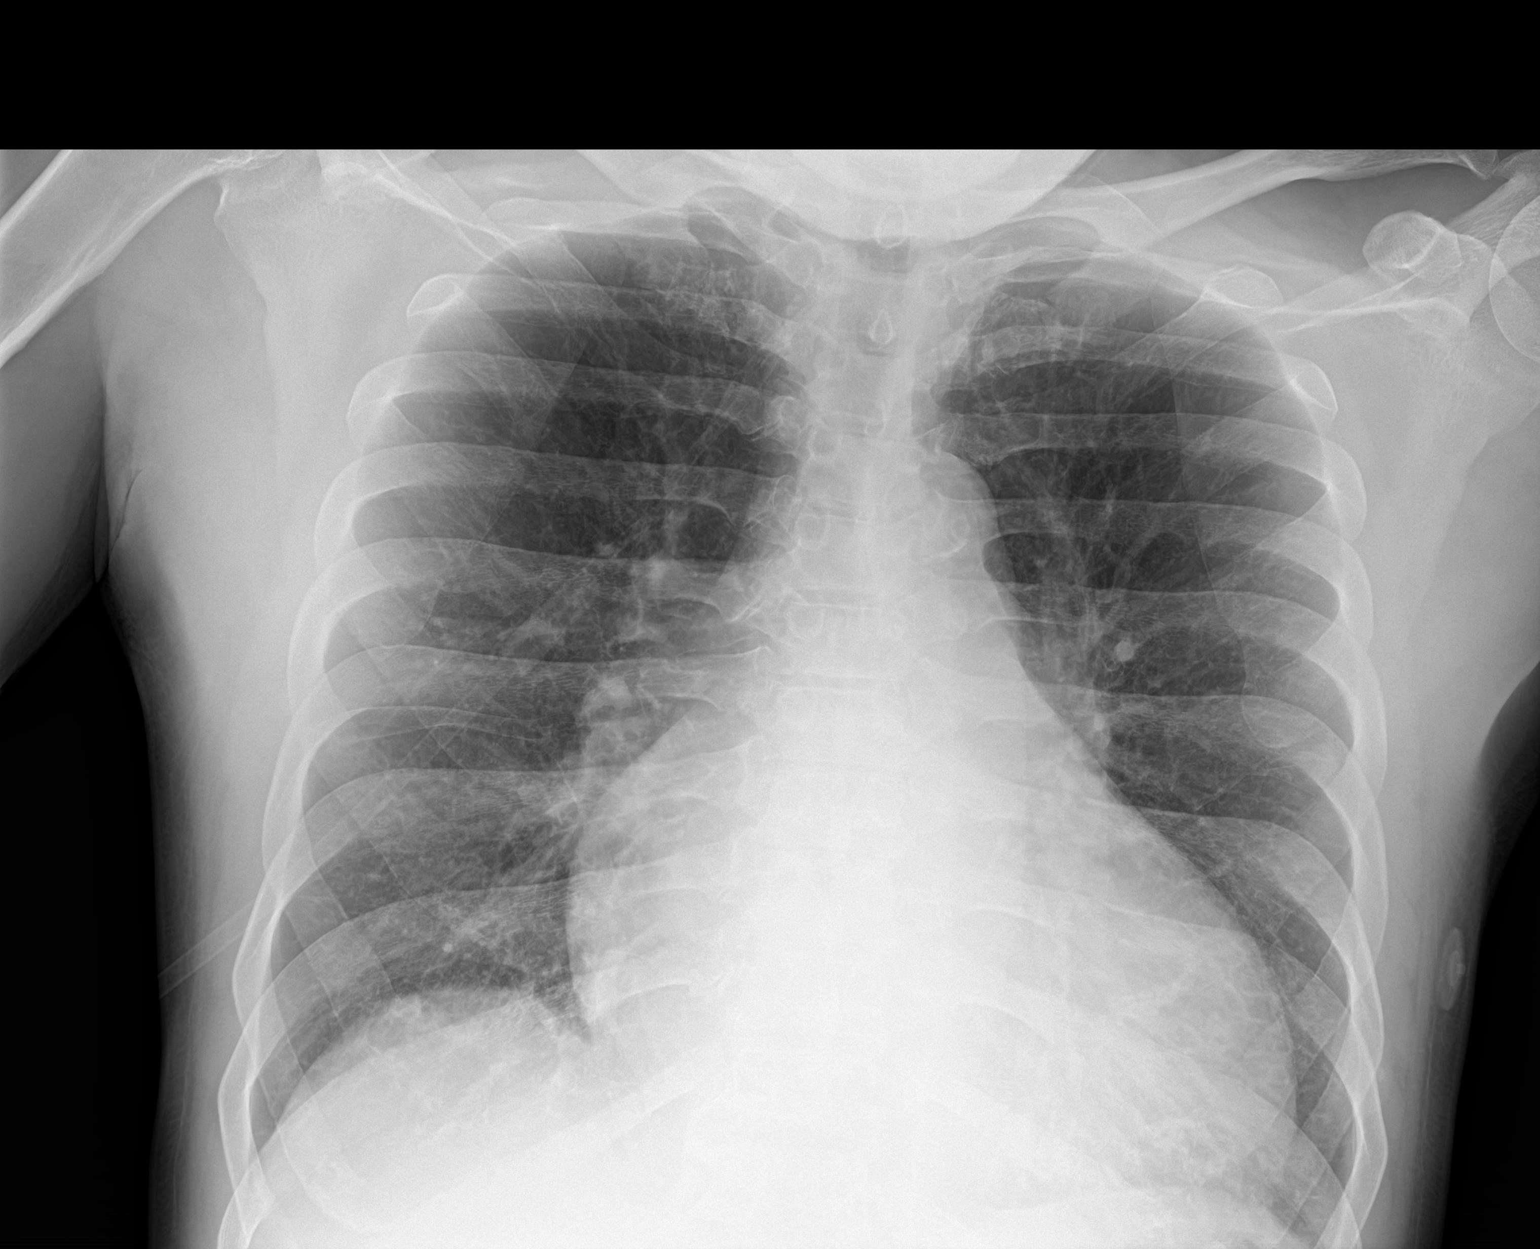

[1 of 1 positions shown; findings below may reference images not displayed]

FINDINGS: Mediastinum hilar structures normal. Stable severe cardiomegaly. No
pulmonary venous congestion. Chronic right base pleural-parenchymal
thickening most consistent scarring again noted. Interim change no
acute infiltrate.
IMPRESSION: 1.  Stable severe cardiomegaly.  No pulmonary venous congestion.

2. Chronic right base pleural-parenchymal thickening most consistent
scarring again noted.

## 2019-05-24 MED ORDER — ENOXAPARIN SODIUM 40 MG/0.4ML ~~LOC~~ SOLN
40.0000 mg | SUBCUTANEOUS | Status: DC
  Administered 2019-05-25 – 2019-05-26 (×2): 40 mg via SUBCUTANEOUS
  Filled 2019-05-24 (×2): qty 0.4

## 2019-05-24 MED ORDER — VITAMIN B-1 100 MG PO TABS
100.0000 mg | ORAL_TABLET | Freq: Every day | ORAL | Status: DC
  Administered 2019-05-25: 100 mg via ORAL
  Filled 2019-05-24 (×2): qty 1

## 2019-05-24 MED ORDER — FUROSEMIDE 40 MG PO TABS
40.0000 mg | ORAL_TABLET | Freq: Two times a day (BID) | ORAL | Status: DC
  Administered 2019-05-24 – 2019-05-25 (×2): 40 mg via ORAL
  Filled 2019-05-24 (×2): qty 1

## 2019-05-24 MED ORDER — ASPIRIN 81 MG PO CHEW
324.0000 mg | CHEWABLE_TABLET | Freq: Once | ORAL | Status: AC
  Administered 2019-05-24: 324 mg via ORAL
  Filled 2019-05-24: qty 4

## 2019-05-24 MED ORDER — AMOXICILLIN-POT CLAVULANATE 875-125 MG PO TABS
1.0000 | ORAL_TABLET | Freq: Two times a day (BID) | ORAL | Status: DC
  Administered 2019-05-24 – 2019-05-26 (×4): 1 via ORAL
  Filled 2019-05-24 (×4): qty 1

## 2019-05-24 MED ORDER — ALBUTEROL SULFATE HFA 108 (90 BASE) MCG/ACT IN AERS
4.0000 | INHALATION_SPRAY | Freq: Once | RESPIRATORY_TRACT | Status: AC
  Administered 2019-05-24: 4 via RESPIRATORY_TRACT
  Filled 2019-05-24: qty 6.7

## 2019-05-24 MED ORDER — GUAIFENESIN 100 MG/5ML PO SOLN
5.0000 mL | ORAL | Status: DC | PRN
  Administered 2019-05-24 – 2019-05-25 (×3): 100 mg via ORAL
  Filled 2019-05-24 (×3): qty 5

## 2019-05-24 MED ORDER — THIAMINE HCL 100 MG/ML IJ SOLN: 100.0000 mg | Freq: Every day | INTRAMUSCULAR | Status: DC

## 2019-05-24 MED ORDER — ADULT MULTIVITAMIN W/MINERALS CH
1.0000 | ORAL_TABLET | Freq: Every day | ORAL | Status: DC
  Administered 2019-05-25: 1 via ORAL
  Filled 2019-05-24 (×2): qty 1

## 2019-05-24 MED ORDER — FOLIC ACID 1 MG PO TABS
1.0000 mg | ORAL_TABLET | Freq: Every day | ORAL | Status: DC
  Administered 2019-05-25: 1 mg via ORAL
  Filled 2019-05-24 (×2): qty 1

## 2019-05-24 MED ORDER — AMLODIPINE BESYLATE 10 MG PO TABS
10.0000 mg | ORAL_TABLET | Freq: Every day | ORAL | Status: DC
  Administered 2019-05-24 – 2019-05-26 (×3): 10 mg via ORAL
  Filled 2019-05-24 (×2): qty 1
  Filled 2019-05-24: qty 2

## 2019-05-24 MED ORDER — TRAMADOL HCL 50 MG PO TABS
50.0000 mg | ORAL_TABLET | Freq: Four times a day (QID) | ORAL | Status: AC | PRN
  Administered 2019-05-24 – 2019-05-25 (×3): 50 mg via ORAL
  Filled 2019-05-24 (×4): qty 1

## 2019-05-24 MED ORDER — NITROGLYCERIN 2 % TD OINT
1.0000 [in_us] | TOPICAL_OINTMENT | Freq: Once | TRANSDERMAL | Status: AC
  Administered 2019-05-24: 1 [in_us] via TOPICAL
  Filled 2019-05-24: qty 1

## 2019-05-24 MED ORDER — ALBUTEROL SULFATE (2.5 MG/3ML) 0.083% IN NEBU: 2.5000 mg | INHALATION_SOLUTION | RESPIRATORY_TRACT | Status: DC | PRN

## 2019-05-24 MED ORDER — IPRATROPIUM-ALBUTEROL 0.5-2.5 (3) MG/3ML IN SOLN
3.0000 mL | RESPIRATORY_TRACT | Status: DC | PRN
  Administered 2019-05-25: 3 mL via RESPIRATORY_TRACT
  Filled 2019-05-24: qty 3

## 2019-05-24 MED ORDER — SODIUM CHLORIDE 0.9% FLUSH
3.0000 mL | Freq: Once | INTRAVENOUS | Status: AC
  Administered 2019-05-24: 3 mL via INTRAVENOUS

## 2019-05-24 MED ORDER — FUROSEMIDE 10 MG/ML IJ SOLN
20.0000 mg | Freq: Once | INTRAMUSCULAR | Status: AC
  Administered 2019-05-24: 20 mg via INTRAVENOUS
  Filled 2019-05-24: qty 2

## 2019-05-24 NOTE — ED Notes (Signed)
ED TO INPATIENT HANDOFF REPORT  ED Nurse Name and Phone #: Marsh Dolly [phone]  S Name/Age/Gender [name] 53 y.o. male Room/Bed: 011C/011C  Code Status   Code Status: Full Code  Home/SNF/Other Home (homeless) Patient oriented to: self, place, time and situation Is this baseline? Yes   Triage Complete: Triage complete  Chief Complaint sob  Triage Note Per EMS, [place] from [place].  Reports he has been doubling up on his lasix taking b/t 80-160mg  a day b/c he feels very SOB. Reports wheezing, inability to sleep, cough but no fever.  Pt is now out of lasix and all psy meds, as a result is now reporting suicidal feeling.  He reports he had a knife to end his life however "they took it from me and put it in my bag."   Allergies Allergies  Allergen Reactions  . Acetaminophen Hives, Nausea And Vomiting and Rash    Pt overdosed on bottle of APAP 05/06/16   . Latex Rash    Level of Care/Admitting Diagnosis ED Disposition    ED Disposition Condition Comment   Admit  Hospital Area: [facility] [[id number]]  Level of Care: Telemetry Cardiac [103]  Covid Evaluation: Confirmed COVID Negative  Diagnosis: Chest pain [098119]  Admitting Physician: Howard Pouch [[id number]]  Attending Physician: Janit Pagan T [[id number]]  PT Class (Do Not Modify): Observation [104]  PT Acc Code (Do Not Modify): Observation [10022]       B Medical/Surgery History Past Medical History:  Diagnosis Date  . Asthma   . Bipolar 1 disorder (HCC)   . CHF (congestive heart failure) (HCC)   . Depression   . Homelessness   . Hypertension   . Schizoaffective disorder (HCC)    History reviewed. No pertinent surgical history.   A IV Location/Drains/Wounds Patient Lines/Drains/Airways Status   Active Line/Drains/Airways    Name:   Placement date:   Placement time:   Site:   Days:   Peripheral IV 05/12/19 Left Hand   05/12/19    0639    Hand   12   Peripheral IV 05/12/19 Left  Antecubital   05/12/19    0728    Antecubital   12   Peripheral IV 05/24/19 Left Antecubital   05/24/19    0834    Antecubital   less than 1          Intake/Output Last 24 hours  Intake/Output Summary (Last 24 hours) at 05/24/2019 1529 Last data filed at 05/24/2019 1002 Gross per 24 hour  Intake 3 ml  Output -  Net 3 ml    Labs/Imaging Results for orders placed or performed during the hospital encounter of 05/24/19 (from the past 48 hour(s))  Basic metabolic panel     Status: Abnormal   Collection Time: 05/24/19  6:17 AM  Result Value Ref Range   Sodium 139 135 - 145 mmol/L   Potassium 3.6 3.5 - 5.1 mmol/L   Chloride 104 98 - 111 mmol/L   CO2 24 22 - 32 mmol/L   Glucose, Bld 87 70 - 99 mg/dL   BUN 11 6 - 20 mg/dL   Creatinine, Ser 6.21 (H) 0.61 - 1.24 mg/dL   Calcium 8.9 8.9 - 30.8 mg/dL   GFR calc non Af Amer 54 (L) >60 mL/min   GFR calc Af Amer >60 >60 mL/min   Anion gap 11 5 - 15    Comment: Performed at [facility], [address] [place], [name] [address]  CBC     Status: Abnormal   Collection Time: 05/24/19  6:17 AM  Result Value Ref Range   WBC 5.7 4.0 - 10.5 K/uL   RBC 4.28 4.22 - 5.81 MIL/uL   Hemoglobin 14.1 13.0 - 17.0 g/dL   HCT 42.4 39.0 - 52.0 %   MCV 99.1 80.0 - 100.0 fL   MCH 32.9 26.0 - 34.0 pg   MCHC 33.3 30.0 - 36.0 g/dL   RDW 16.2 (H) 11.5 - 15.5 %   Platelets 263 150 - 400 K/uL   nRBC 0.0 0.0 - 0.2 %    Comment: Performed at [facility], [place] [address] [place], [place] [address]  Troponin I - ONCE - STAT     Status: Abnormal   Collection Time: 05/24/19  6:17 AM  Result Value Ref Range   Troponin I 0.05 (HH) <0.03 ng/mL    Comment: CRITICAL RESULT CALLED TO, READ BACK BY AND VERIFIED WITH: MILLIE GAGE,RN AT 1517 05/24/2019 BY ZBEECH. Performed at [facility], [place] [address], [place], [place] [address]   Brain natriuretic peptide     Status: Abnormal   Collection Time: 05/24/19  6:17 AM  Result Value Ref Range   B  Natriuretic Peptide 1,686.7 (H) 0.0 - 100.0 pg/mL    Comment: Performed at [place] [address], [place], [place] [address]  Ethanol     Status: None   Collection Time: 05/24/19  6:18 AM  Result Value Ref Range   Alcohol, Ethyl (B) <10 <10 mg/dL    Comment: (NOTE) Lowest detectable limit for serum alcohol is 10 mg/dL. For medical purposes only. Performed at [facility], [place] [address] [place], [place] [address]   Salicylate level     Status: None   Collection Time: 05/24/19  6:18 AM  Result Value Ref Range   Salicylate Lvl <5.4 2.8 - 30.0 mg/dL    Comment: Performed at [place] [address] [place], [place] [address]  Acetaminophen level     Status: Abnormal   Collection Time: 05/24/19  6:18 AM  Result Value Ref Range   Acetaminophen (Tylenol), Serum <10 (L) 10 - 30 ug/mL    Comment: (NOTE) Therapeutic concentrations vary significantly. A range of 10-30 ug/mL  may be an effective concentration for many patients. However, some  are best treated at concentrations outside of this range. Acetaminophen concentrations >150 ug/mL at 4 hours after ingestion  and >50 ug/mL at 12 hours after ingestion are often associated with  toxic reactions. Performed at [facility], [place] [address], [place],  [address]   Rapid urine drug screen (hospital performed)     Status: Abnormal   Collection Time: 05/24/19  7:16 AM  Result Value Ref Range   Opiates NONE DETECTED NONE DETECTED   Cocaine POSITIVE (A) NONE DETECTED   Benzodiazepines NONE DETECTED NONE DETECTED   Amphetamines NONE DETECTED NONE DETECTED   Tetrahydrocannabinol NONE DETECTED NONE DETECTED   Barbiturates NONE DETECTED NONE DETECTED    Comment: (NOTE) DRUG SCREEN FOR MEDICAL PURPOSES ONLY.  IF CONFIRMATION IS NEEDED FOR ANY PURPOSE, NOTIFY LAB WITHIN 5 DAYS. LOWEST DETECTABLE LIMITS FOR URINE DRUG SCREEN Drug Class                     Cutoff (ng/mL) Amphetamine and metabolites     1000 Barbiturate and metabolites    200 Benzodiazepine  200 Tricyclics and metabolites     300 Opiates and metabolites        300 Cocaine and metabolites        300 THC                            50 Performed at [facility], [address] [place], [place] [address]   SARS Coronavirus 2 (CEPHEID- Performed in [facility] hospital lab), Hosp Order     Status: None   Collection Time: 05/24/19  8:35 AM   Specimen: Nasopharyngeal Swab  Result Value Ref Range   SARS Coronavirus 2 NEGATIVE NEGATIVE    Comment: (NOTE) If result is NEGATIVE SARS-CoV-2 target nucleic acids are NOT DETECTED. The SARS-CoV-2 RNA is generally detectable in upper and lower  respiratory specimens during the acute phase of infection. The lowest  concentration of SARS-CoV-2 viral copies this assay can detect is 250  copies / mL. A negative result does not preclude SARS-CoV-2 infection  and should not be used as the sole basis for treatment or other  patient management decisions.  A negative result may occur with  improper specimen collection / handling, submission of specimen other  than nasopharyngeal swab, presence of viral mutation(s) within the  areas targeted by this assay, and inadequate number of viral copies  (<250 copies / mL). A negative result must be combined with clinical  observations, patient history, and epidemiological information. If result is POSITIVE SARS-CoV-2 target nucleic acids are DETECTED. The SARS-CoV-2 RNA is generally detectable in upper and lower  respiratory specimens dur ing the acute phase of infection.  Positive  results are indicative of active infection with SARS-CoV-2.  Clinical  correlation with patient history and other diagnostic information is  necessary to determine patient infection status.  Positive results do  not rule out bacterial infection or co-infection with other viruses. If result is PRESUMPTIVE POSTIVE SARS-CoV-2 nucleic acids MAY BE  PRESENT.   A presumptive positive result was obtained on the submitted specimen  and confirmed on repeat testing.  While 2019 novel coronavirus  (SARS-CoV-2) nucleic acids may be present in the submitted sample  additional confirmatory testing may be necessary for epidemiological  and / or clinical management purposes  to differentiate between  SARS-CoV-2 and other Sarbecovirus currently known to infect humans.  If clinically indicated additional testing with an alternate test  methodology [phone]([id number]) is advised. The SARS-CoV-2 RNA is generally  detectable in upper and lower respiratory sp ecimens during the acute  phase of infection. The expected result is Negative. Fact Sheet for Patients:  BoilerBrush.com.cyhttps://www.fda.gov/media/136312/download Fact Sheet for Healthcare Providers: https://pope.com/https://www.fda.gov/media/136313/download This test is not yet approved or cleared by the [place] FDA and has been authorized for detection and/or diagnosis of SARS-CoV-2 by FDA under an Emergency Use Authorization (EUA).  This EUA will remain in effect (meaning this test can be used) for the duration of the COVID-19 declaration under Section 564(b)(1) of the Act, 21 U.S.C. section 360bbb-3(b)(1), unless the authorization is terminated or revoked sooner. Performed at [facility], [address] [place], [place] [address]    Dg Chest Portable 1 View  Result Date: 05/24/2019 CLINICAL DATA:  Shortness of breath. EXAM: PORTABLE CHEST 1 VIEW COMPARISON:  CT 05/12/2019. Chest x-ray 05/12/2019, 03/04/2019, 05/28/2017. FINDINGS: Mediastinum hilar structures normal. Stable severe cardiomegaly. No pulmonary venous congestion. Chronic right base pleural-parenchymal thickening most consistent scarring again noted. Interim change no acute  infiltrate. IMPRESSION: 1.  Stable severe cardiomegaly.  No pulmonary venous congestion. 2. Chronic right base pleural-parenchymal thickening most consistent scarring again noted.  Electronically Signed   By: Maisie Fushomas  Register   On: 05/24/2019 06:53    Pending Labs Unresulted Labs (From admission, onward)    Start     Ordered   05/24/19 1155  Troponin I (High Sensitivity)  STAT Now then every 2 hours,   R    Question:  Indication  Answer:  Suspect ACS   05/24/19 1154          Vitals/Pain Today's Vitals   05/24/19 1221 05/24/19 1222 05/24/19 1226 05/24/19 1400  BP:  (!) 156/105 (!) 156/105 (!) 149/97  Pulse:   95 85  Resp:    18  Temp:      TempSrc:      SpO2:    99%  Weight:      Height:      PainSc: 0-No pain       Isolation Precautions No active isolations  Medications Medications  amLODipine (NORVASC) tablet 10 mg (10 mg Oral Given 05/24/19 1222)  furosemide (LASIX) tablet 40 mg (has no administration in time range)  enoxaparin (LOVENOX) injection 40 mg (has no administration in time range)  thiamine (VITAMIN B-1) tablet 100 mg (has no administration in time range)    Or  thiamine (B-1) injection 100 mg (has no administration in time range)  folic acid (FOLVITE) tablet 1 mg (has no administration in time range)  multivitamin with minerals tablet 1 tablet (has no administration in time range)  ipratropium-albuterol (DUONEB) 0.5-2.5 (3) MG/3ML nebulizer solution 3 mL (has no administration in time range)  albuterol (PROVENTIL) (2.5 MG/3ML) 0.083% nebulizer solution 2.5 mg (has no administration in time range)  amoxicillin-clavulanate (AUGMENTIN) 875-125 MG per tablet 1 tablet (has no administration in time range)  sodium chloride flush (NS) 0.9 % injection 3 mL (3 mLs Intravenous Given 05/24/19 1002)  aspirin chewable tablet 324 mg (324 mg Oral Given 05/24/19 0959)  nitroGLYCERIN (NITROGLYN) 2 % ointment 1 inch (1 inch Topical Given 05/24/19 1000)  furosemide (LASIX) injection 20 mg (20 mg Intravenous Given 05/24/19 1001)  albuterol (VENTOLIN HFA) 108 (90 Base) MCG/ACT inhaler 4 puff (4 puffs Inhalation Given 05/24/19 1224)    Mobility walks Low  fall risk   Focused Assessments Cardiac Assessment Handoff:  Cardiac Rhythm: Normal sinus rhythm Lab Results  Component Value Date   TROPONINI 0.05 (HH) 05/24/2019   No results found for: DDIMER Does the Patient currently have chest pain? No     R Recommendations: See Admitting Provider Note  Report given to:   Additional Notes:  Patient voluntarily psych hold as well - reports he is homeless and has been out of all of his medications - Lasix and psychiatric meds; being admitted for elevated troponin, exacerbation of CHF. Respirations equal, unlabored on 2L O2 nasal cannula.

## 2019-05-24 NOTE — ED Notes (Signed)
Lunch dinner ordered

## 2019-05-24 NOTE — ED Provider Notes (Signed)
Medical screening examination/treatment/procedure(s) were conducted as a shared visit with non-physician practitioner(s) and myself.  I personally evaluated the patient during the encounter.    53yM with dyspnea. Likely acute on chronic systolic HF in setting cocaine use. Most recent ECHO with EF of 15%. Continues to use cocaine although "I've cut back a whole lot." (9/9 UDS we have in our system going back to 2017 are positive for cocaine). He says he is compliant with his medications though and has been doubling up on the lasix recently. He feels like he has been urinating more since doing this although breathing has not improved. Wet cough. No fever. Pain in L chest below collar bone. Hurts with palpation.   Also says he is suicidal. He reports unhappiness with his medical conditions and difficulty in stopping drug use. He says at one point he was able to stay sober for 6 years though.   May be some degree of malingering as well. Asked me numerous times for something to ear. He is also homeless.    Virgel Manifold, MD 05/29/19 [phone]

## 2019-05-24 NOTE — Progress Notes (Signed)
Patient refusing all lab draw, refused admission weight.  Suicide sitter at bedside.

## 2019-05-24 NOTE — ED Notes (Signed)
BP cuff removed by pt replaced.

## 2019-05-24 NOTE — ED Triage Notes (Signed)
Per EMS, [place].  Reports he has been doubling up on his lasix taking b/t 80-160mg  a day b/c he feels very SOB. Reports wheezing, inability to sleep, cough but no fever.  Pt is now out of lasix and all psy meds, as a result is now reporting suicidal feeling.  He reports he had a knife to end his life however "they took it from me and put it in my bag."

## 2019-05-24 NOTE — Progress Notes (Signed)
Pt continues to c/o severe 8/10 chest pain due to coughing. Pt reports the pain as soreness across his whole chest only with coughing. Pt has refused PM troponin lab. Robitussin ordered per standing orders. MD notified. MD states they will visit the patient to try to convince the patient to accept lab draws. Will continue to monitor.

## 2019-05-24 NOTE — ED Notes (Signed)
Pt has been changed into paper scrubs, wonded by security.  Pt has 3 bags that have not been inventoried in locker #4.  Valuables have been taken to security.  Pt has been calm and cooperative.

## 2019-05-24 NOTE — ED Notes (Signed)
Discussed chest pain with provider and no new orders resulting as chet pain reproducible .

## 2019-05-24 NOTE — ED Notes (Signed)
Patient has multiple requests and repeatedly asks to speak with the "MD" taking care of him. He has been updated on his status - awaiting bed assignment. He is A&O x 4, has no complaints of pain. Resp e/u, skin w/d. Admitting MD paged to speak with patient.

## 2019-05-24 NOTE — ED Notes (Signed)
Date and time results received: 05/24/19 0748  (use smartphrase ".now" to insert current time)  Test: trop Critical Value: 0.05  Name of Provider Notified: Abby, PA  Orders Received? Or Actions Taken?: none

## 2019-05-24 NOTE — ED Notes (Signed)
Admitting MD at bedside.

## 2019-05-24 NOTE — ED Notes (Signed)
Pt given [place] sandwhich and sprite after ok by MD.

## 2019-05-24 NOTE — Progress Notes (Signed)
Patient refused 7pm blood draw.

## 2019-05-24 NOTE — Progress Notes (Signed)
FPTS Interim Progress Note  Received patient patient like to talk to admitting team.  Was able to go in person and speak to patient, he was appreciative of this.  States that he was just frustrated earlier but states that he is willing to be compliant with any medical treatments that is needed.  Describes that he was very dyspneic on presentation but improved after Lasix dose.  Although chest x-ray is negative patient has clinically improved since Lasix dose.  Does report continued chest pain.  Did endorse tooth pain.  States that he has had a tooth pain at home but was unable to seek dental care.  When discussing CODE STATUS patient states "I know I did probably be better off dead" but states that he understands the importance of CPR and would like full code.  Clarified if this meant both chest compressions, shocking, medications, and intubation.  Patient states that he would like all aggressive measures if coded. Discussed with patient that we will keep him full code since he desires all measures be taken.   O: BP (!) 149/97 (BP Location: Right Arm)   Pulse 85   Temp 98.8 F (37.1 C) (Oral)   Resp 18   Ht 5\' 9"  (1.753 m)   Wt 81.6 kg   SpO2 99%   BMI 26.58 kg/m   General: Awake and alert, oriented x4, no apparent distress HEENT: Pupils equal round and reactive to light, extraocular movement intact, poor dentition  Neck: Supple Cardio: Regular rate and rhythm, no murmurs rubs or gallops, chest pain reproducible on exam Respiratory: Diffuse wheezing throughout, crackles present, speaking full sentences, no accessory muscle use GI: Bowel sounds present, firm, nondistended, nontender Remedies: No edema, able to move all extremities spontaneously Neuro: No focal deficits Psych: Suicidal ideations  A/P: ACS rule out -trend trops -AM EKG  Schizoeffective disorder, endorsing SI Continues to endorse suicidal ideations -awaiting pysch recs  -safety sitter at bedside    HFrEF -continuous pulse ox -daily weights -strict I/O -continue lasix 40mg  bid  HTN Blood pressure remains elevated, this may be due to patient's agitation on admission.  Blood pressure has improved to 149/97 after provement in mood. -Amlodipine 10 mg -Continue Lasix as above  H/o substance abuse Most recent CIWA score of 3. -clinical social work consulted  Homelessness -Clinical social work has been consulted  Tooth abscess  Patient with likely tooth abscess.  Very poor dentition.  Stressed importance of seeing outpatient dental upon discharge.  Patient states that he is a willing to see dentist as an outpatient.  Given pain and poor dentition will plan on treating empirically for tooth abscess. -Augmentin twice daily per up-to-date recommendations -Monitor closely for signs of worsening infection  Caroline More, DO 05/24/2019, 2:19 PM PGY-2, [place] [facility] pager [phone]

## 2019-05-24 NOTE — Plan of Care (Signed)

## 2019-05-24 NOTE — H&P (Signed)
Family Medicine [facility] History and Physical Service Pager: [phone]  Patient name: [name] FrohlichCorey [name] Medical record number: [id number] Date of birth: November 22, 1966 Age: 53 y.o. Gender: male  [facility] Care Provider[facility]: Psychiatry Code Status: Full - will need to confirm when patient is compliant with an interview Preferred Emergency Contact: Unknown  Chief Complaint: Dyspnea and elevated troponin  Assessment and Plan: [name] is a 53 y.o. male presenting with dyspnea and elevated troponin . PMH is significant for Severe HFrEF (15% EF in 03/2019), cocaine abuse, alcohol abuse, homelessness  ACS rule out - patient does not cooperate with admission exam or interview. He briefly endorses left sided chest pain which was associated with swelling and which has now resolved. He is noted to have mildly elevated troponin at 0.05, likely demand ischemia. Troponin has been persistently elevated in the past. EKG NSR without ST-T elevation, but with evidence of LVH and QTc 521. He endorses chest pain which was reportedly reproducible per ED exam. - admit as observation for ACS rule out and medical clearance for psych, attending Dr. Lum BabeEniola - monitor on tele - AM EKG - trend troponin I x3 - monitor continuous O2, keep O2 sat >90%  - avoid beta blocker with cocaine + UDS  Schizoeffective disorder, endorsing suicidal ideation - per ED report, the patient endorses SI and wanted to stab himself with a knife. He absolutely refuses to discuss SI with the admitting resident , claiming he will only speak with a psychiatrist. [facility]  - consult to psychiatry - safety sitter  Chronic, severe HFrEF - seems to be at baseline, not in exacerbation but I was unable to examine patient out of respect for his autonomy and his refusal of physical exam. His most recent echo 03/2019 with 15% EF, diffuse LV hypokinesis. He does  endorse dyspnea on exertion which is chronic and at baseline per limited interview. Weight is 81 kg from 83 kg last two times he was in the hospital. Uncertain dry weight. BNP is 1600,  persistently elevated per chart review.  CXR with stable severe cardiomegaly and no pulm venous congestion. He was placed on O2 however unclear whether there was a desat. He has been taking 80 mg lasix BID (2x his prescribed dose) without significant increase in UOP per his report. - monitor O2 sat - daily weight - strict I/O - he received 20 mg lasix in ED, would consider increasing frequency of lasix dosing to 40 mg TID to see if this yields more UOP >> defer to inpatient team once patient is examined - monitor Cr on AM BMP - consider palliative consult   HTN - lisinopril and norvasc noted in previous admit notes to internal med. Lisinopril no longer on med list. BP elevated 177/100s on admit. - continue norvasc10 mg - diurese as above  Hx substance abuse - cocaine on UDS, ethanol use noted in chart. - monitor on CIWA - cocaine cessation counseling  Homelessness - consult to SW  FEN/GI: Heart healthy diet Prophylaxis: lovenox  Disposition: observation, expect DC 6/24  History of Present Illness:  Note, interview was limited as patient refused to answer many questions.   [name] FrohlichCorey [name] is a 53 y.o. male presenting with elevated troponin. It seems patient presented to ED for suicidality and dyspnea. He reports chronic dyspnea prompted him to take 2x his usual lasix dose over the past few days, but he denies increased UOP. He  has chest pain that is in his upper left chest near his shoulder, where he reports he previously had "swelling" that has resolved. He endorsed suicidal ideation to ED provider but refuses to speak with admitting physician about his psychiatric condition and insists on speaking with a psychiatrist.  ED course: Patient to be admitted for ACS rule out given elevated trop at 0.05 and  dyspnea. He also needs evaluation for suicidal ideation. The patient refused to comply with most of the admitting interview, stating "you are the doctor, you should know these answers" and ultimately asking the provider to leave the room. He refused to allow a physical exam.  Review Of Systems: Per HPI with the following additions: Patient denies nausea, vomiting, dysuria. He denies fevers. He denies sore throat, cough or congestion.  ROS  Patient Active Problem List   Diagnosis Date Noted  . Chest pain 05/24/2019  . Acute on chronic systolic heart failure ([place]) 04/18/2019  . Bilateral foot pain 04/05/2019  . Acute on chronic clinical systolic heart failure ([place]) 04/04/2019  . Housing situation unstable 03/28/2019  . AKI (acute kidney injury) ([place])   . Bilateral lower extremity edema   . Acute on chronic HFrEF (heart failure with reduced ejection fraction) ([place]) 03/27/2019  . COPD exacerbation ([place])   . Acute exacerbation of CHF (congestive heart failure) ([place]) 03/04/2019  . Asthma 12/26/2016  . Tobacco use disorder 12/26/2016  . Schizophrenia ([place]) 12/26/2016  . Cocaine abuse with cocaine-induced mood disorder ([place]) 09/06/2016  . Alcohol use disorder, mild, abuse 07/31/2016  . Substance induced mood disorder ([place]) 07/30/2016  . Schizoaffective disorder, bipolar type ([name]) 05/31/2016  . Cocaine use disorder, severe, dependence ([place]) 05/31/2016  . Essential hypertension 05/31/2016   Past Medical History: Past Medical History:  Diagnosis Date  . Asthma   . Bipolar 1 disorder ([place])   . CHF (congestive heart failure) ([place])   . Depression   . Homelessness   . Hypertension   . Schizoaffective disorder [facility])     Past Surgical History: History reviewed. No pertinent surgical history.  Social History: Social History   Tobacco Use  . Smoking status: Current Every Day Smoker    Years: 30.00    Types: Cigarettes  . Smokeless tobacco: Never Used  Substance Use Topics  . Alcohol  use: Yes    Comment: infrequently  . Drug use: Yes    Types: Cocaine, Marijuana   Additional social history:   Please also refer to relevant sections of EMR.  Family History: Family History  Problem Relation Age of Onset  . Drug abuse Mother   . Suicidality Mother    (If not completed, MUST add something in)  Allergies and Medications: Allergies  Allergen Reactions  . Acetaminophen Hives, Nausea And Vomiting and Rash    Pt overdosed on bottle of APAP 05/06/16   . Latex Rash   No current facility-administered medications on file prior to encounter.    Current Outpatient Medications on File Prior to Encounter  Medication Sig Dispense Refill  . furosemide (LASIX) 40 MG tablet Take 1 tablet (40 mg total) by mouth 2 (two) times daily. 60 tablet 0  . albuterol (PROVENTIL HFA;VENTOLIN HFA) 108 (90 Base) MCG/ACT inhaler Inhale 1-2 puffs into the lungs every 4 (four) hours as needed for shortness of breath. (Patient not taking: Reported on 05/24/2019) 1 Inhaler 1  . amLODipine (NORVASC) 10 MG tablet Take 1 tablet (10 mg total) by mouth daily. (Patient not taking: Reported on 05/12/2019) 30  tablet 0  . amoxicillin (AMOXIL) 500 MG capsule Take 1 capsule (500 mg total) by mouth 3 (three) times daily. (Patient not taking: Reported on 05/24/2019) 21 capsule 0  . furosemide (LASIX) 40 MG tablet Take 1 tablet (40 mg total) by mouth 2 (two) times daily for 30 days. (Patient not taking: Reported on 05/24/2019) 60 tablet 0  . ipratropium (ATROVENT) 0.02 % nebulizer solution Take 2.5 mLs (0.5 mg total) by nebulization every 6 (six) hours as needed for wheezing or shortness of breath. (Patient not taking: Reported on 05/12/2019) 75 mL 0  . potassium chloride SA (K-DUR) 20 MEQ tablet Take 1 tablet (20 mEq total) by mouth daily. (Patient not taking: Reported on 05/24/2019) 10 tablet 0    Objective: BP (!) 177/150   Pulse 100   Temp 98.8 F (37.1 C) (Oral)   Resp (!) 22   Ht 5\' 9"  (1.753 m)   Wt 81.6 kg    SpO2 100%   BMI 26.58 kg/m  Exam: Patient appears nontoxic. He speaks in full sentences. He does not appear to be in acute distress.  Patient refused physical exam by the admitting resident and requested that she leave the room. Attending plans to see the patient later to try again.  Labs and Imaging: CBC BMET  Recent Labs  Lab 05/24/19 0617  WBC 5.7  HGB 14.1  HCT 42.4  PLT 263   Recent Labs  Lab 05/24/19 0617  NA 139  K 3.6  CL 104  CO2 24  BUN 11  CREATININE 1.47*  GLUCOSE 87  CALCIUM 8.9     Trop 0.05 EKG NSR, no ST elev, +LVH, QTc 521 Covid neg  Dg Chest Portable 1 View Result Date: 05/24/2019 FINDINGS: Mediastinum hilar structures normal. Stable severe cardiomegaly. No pulmonary venous congestion. Chronic right base pleural-parenchymal thickening most consistent scarring again noted. Interim change no acute infiltrate.  IMPRESSION:  1.  Stable severe cardiomegaly.  No pulmonary venous congestion.  2. Chronic right base pleural-parenchymal thickening most consistent scarring again noted.    Howard Pouch, MD 05/24/2019, 11:43 AM PGY-3, [facility] FPTS Intern pager: [phone], text pages welcome

## 2019-05-24 NOTE — ED Provider Notes (Signed)
[facility] Provider Note   CSN: [id number] Arrival date & time: 05/24/19  0551    History   Chief Complaint Chief Complaint  Patient presents with  . Shortness of Breath  . Suicidal    HPI [name] FrohlichCorey [name] is a 53 y.o. male with past medical history of asthma, bipolar disease, CHF, homelessness, hypertension, polysubstance abuse, schizoaffective disorder.  Patient presents today with chief complaint of suicidality and shortness of breath.  Patient states that he has been off of his psych meds because he has been out of them for a few weeks.  He is having worsening depression and thoughts of suicidal ideation.  He thought about cutting his neck with a knife however he apparently does not have access to the knife at this point.  Patient has also been using lines of cocaine within the last 24 hours.  He currently complains of pain on the left side of his chest.  He has been taking Lasix but denies sufficient urinary output and has been having worsening exertional dyspnea and orthopnea.  He denies fevers, chills, nausea, vomiting, diaphoresis, epigastric pain.  He has a cough and states that he is "bringing up fluid from my lungs."  He took his Lasix yesterday.  Out of the medication as of today.     HPI  Past Medical History:  Diagnosis Date  . Asthma   . Bipolar 1 disorder ([facility])   . CHF (congestive heart failure) (H[facility])   . Depression   . Homelessness   . Hypertension   . Schizoaffective disorder [facility](HCC)     Patient Active Problem List   Diagnosis Date Noted  . Chest pain 05/24/2019  . Elevated troponin   . Homelessness   . Suicidal ideations   . Acute on chronic systolic heart failure (H[facility]) 04/18/2019  . Bilateral foot pain 04/05/2019  . Acute on chronic clinical systolic heart failure (H[facility]) 04/04/2019  . Housing situation unstable 03/28/2019  . AKI (acute kidney injury) ([facility])   . Bilateral lower extremity edema   . Acute on chronic  HFrEF (heart failure with reduced ejection fraction) (H[facility]) 03/27/2019  . COPD exacerbation ([facility])   . Acute exacerbation of CHF (congestive heart failure) (H[facility]) 03/04/2019  . Asthma 12/26/2016  . Tobacco use disorder 12/26/2016  . Schizophrenia (H[facility]) 12/26/2016  . Cocaine abuse with cocaine-induced mood disorder (H[facility]) 09/06/2016  . Alcohol use disorder, mild, abuse 07/31/2016  . Substance induced mood disorder (HCC) 07/30/2016  . Schizoaffective disorder, bipolar type (H[facility]) 05/31/2016  . Cocaine use disorder, severe, dependence (HCC) 05/31/2016  . Essential hypertension 05/31/2016    History reviewed. No pertinent surgical history.      Home Medications    Prior to Admission medications   Medication Sig Start Date End Date Taking? Authorizing Provider  furosemide (LASIX) 40 MG tablet Take 1 tablet (40 mg total) by mouth 2 (two) times daily. 05/12/19  Yes Cathren LaineSteinl, Kevin, MD  albuterol (PROVENTIL HFA;VENTOLIN HFA) 108 (90 Base) MCG/ACT inhaler Inhale 1-2 puffs into the lungs every 4 (four) hours as needed for shortness of breath. Patient not taking: Reported on 05/24/2019 08/22/18   Dione BoozeGlick, [name], MD  amLODipine (NORVASC) 10 MG tablet Take 1 tablet (10 mg total) by mouth daily. Patient not taking: Reported on 05/12/2019 04/06/19   Ali LoweVogel, Marie S, MD  amoxicillin (AMOXIL) 500 MG capsule Take 1 capsule (500 mg total) by mouth 3 (three) times daily. Patient not taking: Reported on 05/24/2019 05/12/19   Cathren LaineSteinl, Kevin,  MD  furosemide (LASIX) 40 MG tablet Take 1 tablet (40 mg total) by mouth 2 (two) times daily for 30 days. Patient not taking: Reported on 05/24/2019 04/19/19 05/19/19  Theotis BarrioLee, Joshua K, MD  ipratropium (ATROVENT) 0.02 % nebulizer solution Take 2.5 mLs (0.5 mg total) by nebulization every 6 (six) hours as needed for wheezing or shortness of breath. Patient not taking: Reported on 05/12/2019 03/09/19   Claudean SeveranceKrienke, Marissa M, MD  potassium chloride SA (K-DUR) 20 MEQ tablet Take 1 tablet (20 mEq  total) by mouth daily. Patient not taking: Reported on 05/24/2019 05/12/19   Cathren LaineSteinl, Kevin, MD    Family History Family History  Problem Relation Age of Onset  . Drug abuse Mother   . Suicidality Mother     Social History Social History   Tobacco Use  . Smoking status: Current Every Day Smoker    Years: 30.00    Types: Cigarettes  . Smokeless tobacco: Never Used  Substance Use Topics  . Alcohol use: Yes    Comment: infrequently  . Drug use: Yes    Types: Cocaine, Marijuana     Allergies   Acetaminophen and Latex   Review of Systems Review of Systems  Ten systems reviewed and are negative for acute change, except as noted in the HPI.   Physical Exam Updated Vital Signs BP 129/87   Pulse 82   Temp 98.8 F (37.1 C) (Oral)   Resp 18   Ht 5\' 9"  (1.753 m)   Wt 81.6 kg   SpO2 99%   BMI 26.58 kg/m   Physical Exam Vitals signs and nursing note reviewed.  Constitutional:      General: He is not in acute distress.    Appearance: He is well-developed. He is not diaphoretic.  HENT:     Head: Normocephalic and atraumatic.  Eyes:     General: No scleral icterus.    Conjunctiva/sclera: Conjunctivae normal.  Neck:     Musculoskeletal: Normal range of motion and neck supple.  Cardiovascular:     Rate and Rhythm: Normal rate and regular rhythm.     Heart sounds: Normal heart sounds.  Pulmonary:     Effort: Pulmonary effort is normal. Tachypnea present. No respiratory distress.     Breath sounds: Decreased breath sounds and wheezing present.     Comments: Tachypnea, decreased air movement, wheezes in the upper lung fields bilaterally, Decreased breath sounds in the right and left middle and lower lung fields with poor air movement. Chest:     Chest wall: Tenderness present.     Comments: Tender to palpation left upper chest wall reproduces patient's pain of complaint Abdominal:     Palpations: Abdomen is soft.     Tenderness: There is no abdominal tenderness.   Musculoskeletal:     Right lower leg: Edema present.     Left lower leg: Edema present.     Comments: 1+ pedal edema bilateral ankles  Skin:    General: Skin is warm and dry.  Neurological:     Mental Status: He is alert.  Psychiatric:        Mood and Affect: Affect is angry.        Speech: Speech normal.        Behavior: Behavior is agitated.        Cognition and Memory: Cognition normal.      ED Treatments / Results  Labs (all labs ordered are listed, but only abnormal results are displayed) Labs Reviewed  BASIC  METABOLIC PANEL - Abnormal; Notable for the following components:      Result Value   Creatinine, Ser 1.47 (*)    GFR calc non Af Amer 54 (*)    All other components within normal limits  CBC - Abnormal; Notable for the following components:   RDW 16.2 (*)    All other components within normal limits  TROPONIN I - Abnormal; Notable for the following components:   Troponin I 0.05 (*)    All other components within normal limits  ACETAMINOPHEN LEVEL - Abnormal; Notable for the following components:   Acetaminophen (Tylenol), Serum <10 (*)    All other components within normal limits  RAPID URINE DRUG SCREEN, HOSP PERFORMED - Abnormal; Notable for the following components:   Cocaine POSITIVE (*)    All other components within normal limits  BRAIN NATRIURETIC PEPTIDE - Abnormal; Notable for the following components:   B Natriuretic Peptide 1,686.7 (*)    All other components within normal limits  SARS CORONAVIRUS 2 (HOSPITAL ORDER, PERFORMED IN [facility])  ETHANOL  SALICYLATE LEVEL    EKG None  Radiology Dg Chest Portable 1 View  Result Date: 05/24/2019 CLINICAL DATA:  Shortness of breath. EXAM: PORTABLE CHEST 1 VIEW COMPARISON:  CT 05/12/2019. Chest x-ray 05/12/2019, 03/04/2019, 05/28/2017. FINDINGS: Mediastinum hilar structures normal. Stable severe cardiomegaly. No pulmonary venous congestion. Chronic right base pleural-parenchymal  thickening most consistent scarring again noted. Interim change no acute infiltrate. IMPRESSION: 1.  Stable severe cardiomegaly.  No pulmonary venous congestion. 2. Chronic right base pleural-parenchymal thickening most consistent scarring again noted. Electronically Signed   By: Maisie Fus  Register   On: 05/24/2019 06:53    Procedures Procedures (including critical care time)  Medications Ordered in ED Medications  amLODipine (NORVASC) tablet 10 mg (10 mg Oral Given 05/24/19 1222)  furosemide (LASIX) tablet 40 mg (40 mg Oral Given 05/24/19 1701)  enoxaparin (LOVENOX) injection 40 mg (has no administration in time range)  thiamine (VITAMIN B-1) tablet 100 mg (has no administration in time range)    Or  thiamine (B-1) injection 100 mg (has no administration in time range)  folic acid (FOLVITE) tablet 1 mg (has no administration in time range)  multivitamin with minerals tablet 1 tablet (has no administration in time range)  ipratropium-albuterol (DUONEB) 0.5-2.5 (3) MG/3ML nebulizer solution 3 mL (has no administration in time range)  albuterol (PROVENTIL) (2.5 MG/3ML) 0.083% nebulizer solution 2.5 mg (has no administration in time range)  amoxicillin-clavulanate (AUGMENTIN) 875-125 MG per tablet 1 tablet (has no administration in time range)  sodium chloride flush (NS) 0.9 % injection 3 mL (3 mLs Intravenous Given 05/24/19 1002)  aspirin chewable tablet 324 mg (324 mg Oral Given 05/24/19 0959)  nitroGLYCERIN (NITROGLYN) 2 % ointment 1 inch (1 inch Topical Given 05/24/19 1000)  furosemide (LASIX) injection 20 mg (20 mg Intravenous Given 05/24/19 1001)  albuterol (VENTOLIN HFA) 108 (90 Base) MCG/ACT inhaler 4 puff (4 puffs Inhalation Given 05/24/19 1224)     Initial Impression / Assessment and Plan / ED Course  I have reviewed the triage vital signs and the nursing notes.  Pertinent labs & imaging results that were available during my care of the patient were reviewed by me and considered in my  medical decision making (see chart for details).  Clinical Course as of May 24 1719  Tue May 24, 2019  0839 Troponin I(!!): 0.05 [AH]  0850 Troponin I(!!): 0.05 [AH]    Clinical Course User Index [AH] Tiburcio Pea,  Vernie Shanks, PA-C       53 year old male with past medical history of chronic heart failure, cocaine abuse, recurrent suicidality, schizophrenia, homelessness who presents with worsening shortness of breath and appears volume overloaded on exam.  His BNP is 1686.7.  Patient's troponin is elevated.  I have begun the patient on IV Lasix.  Patient is agitated and has been somewhat difficult to manage here in the emergency department secondary to anger and irritability.  He has been difficult to work with and at times demanding food and to speak with a psychiatrist immediately although he has not been medically cleared and will need admission.  I spoke with the did not service who will admit the patient.  He is stable throughout his ED visit.  He will need trending troponins and the face of his recent cocaine use and elevated troponin levels.  Final Clinical Impressions(s) / ED Diagnoses   Final diagnoses:  Acute on chronic congestive heart failure, unspecified heart failure type ([place])  Elevated troponin  Cocaine abuse [facility])  Suicidal ideations  Homelessness    ED Discharge Orders    None       Margarita Mail, PA-C 05/24/19 1720    Virgel Manifold, MD 05/25/19 [phone]

## 2019-05-25 ENCOUNTER — Encounter (HOSPITAL_COMMUNITY): Payer: Self-pay | Admitting: Cardiology

## 2019-05-25 DIAGNOSIS — F19188 Other psychoactive substance abuse with other psychoactive substance-induced disorder: Secondary | ICD-10-CM | POA: Diagnosis not present

## 2019-05-25 DIAGNOSIS — Z590 Homelessness: Secondary | ICD-10-CM | POA: Diagnosis not present

## 2019-05-25 DIAGNOSIS — R0789 Other chest pain: Secondary | ICD-10-CM | POA: Diagnosis not present

## 2019-05-25 DIAGNOSIS — F250 Schizoaffective disorder, bipolar type: Secondary | ICD-10-CM | POA: Diagnosis present

## 2019-05-25 DIAGNOSIS — F339 Major depressive disorder, recurrent, unspecified: Secondary | ICD-10-CM | POA: Diagnosis not present

## 2019-05-25 DIAGNOSIS — R45851 Suicidal ideations: Secondary | ICD-10-CM | POA: Diagnosis present

## 2019-05-25 DIAGNOSIS — F1410 Cocaine abuse, uncomplicated: Secondary | ICD-10-CM | POA: Diagnosis present

## 2019-05-25 DIAGNOSIS — F29 Unspecified psychosis not due to a substance or known physiological condition: Secondary | ICD-10-CM | POA: Diagnosis not present

## 2019-05-25 DIAGNOSIS — K047 Periapical abscess without sinus: Secondary | ICD-10-CM | POA: Diagnosis present

## 2019-05-25 DIAGNOSIS — Z79899 Other long term (current) drug therapy: Secondary | ICD-10-CM | POA: Diagnosis not present

## 2019-05-25 DIAGNOSIS — Z1159 Encounter for screening for other viral diseases: Secondary | ICD-10-CM | POA: Diagnosis not present

## 2019-05-25 DIAGNOSIS — I110 Hypertensive heart disease with heart failure: Secondary | ICD-10-CM | POA: Diagnosis not present

## 2019-05-25 DIAGNOSIS — Z9114 Patient's other noncompliance with medication regimen: Secondary | ICD-10-CM | POA: Diagnosis not present

## 2019-05-25 DIAGNOSIS — J449 Chronic obstructive pulmonary disease, unspecified: Secondary | ICD-10-CM | POA: Diagnosis present

## 2019-05-25 DIAGNOSIS — R9431 Abnormal electrocardiogram [ECG] [EKG]: Secondary | ICD-10-CM

## 2019-05-25 DIAGNOSIS — I5023 Acute on chronic systolic (congestive) heart failure: Secondary | ICD-10-CM | POA: Diagnosis present

## 2019-05-25 DIAGNOSIS — F319 Bipolar disorder, unspecified: Secondary | ICD-10-CM | POA: Diagnosis not present

## 2019-05-25 DIAGNOSIS — F17210 Nicotine dependence, cigarettes, uncomplicated: Secondary | ICD-10-CM | POA: Diagnosis present

## 2019-05-25 DIAGNOSIS — I248 Other forms of acute ischemic heart disease: Secondary | ICD-10-CM | POA: Diagnosis present

## 2019-05-25 LAB — RENAL FUNCTION PANEL
Albumin: 3 g/dL — ABNORMAL LOW (ref 3.5–5.0)
Anion gap: 9 (ref 5–15)
BUN: 10 mg/dL (ref 6–20)
CO2: 23 mmol/L (ref 22–32)
Calcium: 8.8 mg/dL — ABNORMAL LOW (ref 8.9–10.3)
Chloride: 104 mmol/L (ref 98–111)
Creatinine, Ser: 1.2 mg/dL (ref 0.61–1.24)
GFR calc Af Amer: >60 mL/min (ref >60)
GFR calc non Af Amer: >60 mL/min (ref >60)
Glucose, Bld: 107 mg/dL — ABNORMAL HIGH (ref 70–99)
Phosphorus: 4.6 mg/dL (ref 2.5–4.6)
Potassium: 3.8 mmol/L (ref 3.5–5.1)
Sodium: 136 mmol/L (ref 135–145)

## 2019-05-25 LAB — CBC
HCT: 43.5 % (ref 39.0–52.0)
Hemoglobin: 14.5 g/dL (ref 13.0–17.0)
MCH: 32.2 pg (ref 26.0–34.0)
MCHC: 33.3 g/dL (ref 30.0–36.0)
MCV: 96.7 fL (ref 80.0–100.0)
Platelets: 260 10*3/uL (ref 150–400)
RBC: 4.5 MIL/uL (ref 4.22–5.81)
RDW: 15.9 % — ABNORMAL HIGH (ref 11.5–15.5)
WBC: 4.5 10*3/uL (ref 4.0–10.5)
nRBC: 0 % (ref 0.0–0.2)

## 2019-05-25 LAB — TROPONIN I (HIGH SENSITIVITY): Troponin I (High Sensitivity): 33 ng/L — ABNORMAL HIGH (ref <18)

## 2019-05-25 MED ORDER — LIDOCAINE 5 % EX PTCH
1.0000 | MEDICATED_PATCH | CUTANEOUS | Status: DC
  Administered 2019-05-25 – 2019-05-26 (×2): 1 via TRANSDERMAL
  Filled 2019-05-25 (×2): qty 1

## 2019-05-25 MED ORDER — LORAZEPAM 1 MG PO TABS: 1.0000 mg | ORAL_TABLET | Freq: Four times a day (QID) | ORAL | Status: DC | PRN

## 2019-05-25 MED ORDER — MENTHOL 3 MG MT LOZG
1.0000 | LOZENGE | OROMUCOSAL | Status: DC | PRN
  Administered 2019-05-25: 3 mg via ORAL
  Filled 2019-05-25: qty 9

## 2019-05-25 MED ORDER — KETOROLAC TROMETHAMINE 10 MG PO TABS
10.0000 mg | ORAL_TABLET | Freq: Once | ORAL | Status: AC
  Administered 2019-05-25: 10 mg via ORAL
  Filled 2019-05-25: qty 1

## 2019-05-25 MED ORDER — ARIPIPRAZOLE 2 MG PO TABS
2.0000 mg | ORAL_TABLET | ORAL | Status: DC
  Filled 2019-05-25: qty 1

## 2019-05-25 MED ORDER — DIVALPROEX SODIUM 250 MG PO DR TAB
250.0000 mg | DELAYED_RELEASE_TABLET | Freq: Two times a day (BID) | ORAL | Status: DC
  Filled 2019-05-25 (×2): qty 1

## 2019-05-25 MED ORDER — FUROSEMIDE 80 MG PO TABS
80.0000 mg | ORAL_TABLET | Freq: Two times a day (BID) | ORAL | Status: DC
  Administered 2019-05-25 – 2019-05-26 (×2): 80 mg via ORAL
  Filled 2019-05-25 (×2): qty 1

## 2019-05-25 MED ORDER — ARIPIPRAZOLE 2 MG PO TABS
2.0000 mg | ORAL_TABLET | Freq: Every day | ORAL | Status: DC
  Filled 2019-05-25 (×2): qty 1

## 2019-05-25 MED ORDER — MUSCLE RUB 10-15 % EX CREA
TOPICAL_CREAM | CUTANEOUS | Status: DC | PRN
  Filled 2019-05-25: qty 85

## 2019-05-25 NOTE — Progress Notes (Signed)
Pt has refused AM vital signs and daily weight. Pt was convinced to let staff get his BP.

## 2019-05-25 NOTE — Progress Notes (Signed)
Cardiologist in room examining pt

## 2019-05-25 NOTE — Consult Note (Signed)
[facility] Face-to-Face Psychiatry Consult   Reason for Consult:  depression Referring Physician:  IM Patient Identification: [name] MRN:  [id number] Principal Diagnosis: <principal problem not specified> Diagnosis:  Active Problems:   Chest pain   Total Time spent with patient: 20 minutes  Subjective:   [name] is a 53 y.o. male patient admitted with chest pain.  HPI:  Patient is a 53 YO male with psychosis and suicidal ideation.  Past Psychiatric History: Patient is a 53 YO male with a reported history of bipolar disorder, cocaine dependence who was admitted on 6/24 secondary to CHF. Patient stated that he is having auditory hallucinations and suicidal ideation. The patient that he is currently homeless and has not had his meds in some time. He stated that his AH are "in his head". He stated that he was suicidal and the police have had to take a knife out of his hands. He is unsure when his last psych hospitalization was. EMR shows probably was 11/19. Diagnosis at that time was cocaine dependence, substance induced mood disorder, severe alcohol use disorder, unspecified personality disorder and schizoaffective disorder. He was admitted at that time with plan to cut his wrists. He was noted at that time to have admitted and did not show any sides or symptoms of psychotic illness. He has been in residential substance abuse treatment previously. His last treatment for SA was to be at [facility] in [place], [name] or [place] after his discharge from [facility]. He is not a great historian. He had been referred to an [facility] but declined that when he was discharged. He had been placed on Depakote for mood stability secondary to a prolonged QTc. His EKG revealed a QTc of approximately 520. Pertinent labs include creatinine of 1.2, BNP of 1686 and UDS for cocaine.  Risk to Self:   Risk to Others:   Prior Inpatient Therapy:   Prior Outpatient Therapy:    Past Medical History:  Past Medical  History:  Diagnosis Date  . Asthma   . Bipolar 1 disorder (HCC)   . CHF (congestive heart failure) (HCC)   . Depression   . Homelessness   . Hypertension   . Schizoaffective disorder (HCC)    History reviewed. No pertinent surgical history. Family History:  Family History  Problem Relation Age of Onset  . Drug abuse Mother   . Suicidality Mother    Family Psychiatric  History: non-contributory Social History:  Social History   Substance and Sexual Activity  Alcohol Use Yes   Comment: infrequently     Social History   Substance and Sexual Activity  Drug Use Yes  . Types: Cocaine, Marijuana    Social History   Socioeconomic History  . Marital status: Single    Spouse name: Not on file  . Number of children: Not on file  . Years of education: Not on file  . Highest education level: Not on file  Occupational History  . Not on file  Social Needs  . Financial resource strain: Not on file  . Food insecurity    Worry: Not on file    Inability: Not on file  . Transportation needs    Medical: Not on file    Non-medical: Not on file  Tobacco Use  . Smoking status: Current Every Day Smoker    Years: 30.00    Types: Cigarettes  . Smokeless tobacco: Never Used  Substance and Sexual Activity  . Alcohol use: Yes    Comment:  infrequently  . Drug use: Yes    Types: Cocaine, Marijuana  . Sexual activity: Yes    Birth control/protection: None  Lifestyle  . Physical activity    Days per week: Not on file    Minutes per session: Not on file  . Stress: Not on file  Relationships  . Social Musicianconnections    Talks on phone: Not on file    Gets together: Not on file    Attends religious service: Not on file    Active member of club or organization: Not on file    Attends meetings of clubs or organizations: Not on file    Relationship status: Not on file  Other Topics Concern  . Not on file  Social History Narrative  . Not on file   Additional Social History:     Allergies:   Allergies  Allergen Reactions  . Acetaminophen Hives, Nausea And Vomiting and Rash    Pt overdosed on bottle of APAP 05/06/16   . Latex Rash    Labs:  Results for orders placed or performed during the hospital encounter of 05/24/19 (from the past 48 hour(s))  Basic metabolic panel     Status: Abnormal   Collection Time: 05/24/19  6:17 AM  Result Value Ref Range   Sodium 139 135 - 145 mmol/L   Potassium 3.6 3.5 - 5.1 mmol/L   Chloride 104 98 - 111 mmol/L   CO2 24 22 - 32 mmol/L   Glucose, Bld 87 70 - 99 mg/dL   BUN 11 6 - 20 mg/dL   Creatinine, Ser 0.451.47 (H) 0.61 - 1.24 mg/dL   Calcium 8.9 8.9 - 40.910.3 mg/dL   GFR calc non Af Amer 54 (L) >60 mL/min   GFR calc Af Amer >60 >60 mL/min   Anion gap 11 5 - 15    Comment: Performed at [facility], [address] [place], [place] [address]  CBC     Status: Abnormal   Collection Time: 05/24/19  6:17 AM  Result Value Ref Range   WBC 5.7 4.0 - 10.5 K/uL   RBC 4.28 4.22 - 5.81 MIL/uL   Hemoglobin 14.1 13.0 - 17.0 g/dL   HCT 47.842.4 29.539.0 - 62.152.0 %   MCV 99.1 80.0 - 100.0 fL   MCH 32.9 26.0 - 34.0 pg   MCHC 33.3 30.0 - 36.0 g/dL   RDW 30.816.2 (H) 65.711.5 - 84.615.5 %   Platelets 263 150 - 400 K/uL   nRBC 0.0 0.0 - 0.2 %    Comment: Performed at [facility], [address] [place], [place] [address]  Troponin I - ONCE - STAT     Status: Abnormal   Collection Time: 05/24/19  6:17 AM  Result Value Ref Range   Troponin I 0.05 (HH) <0.03 ng/mL    Comment: CRITICAL RESULT CALLED TO, READ BACK BY AND VERIFIED WITH: MILLIE GAGE,RN AT 0748 05/24/2019 BY ZBEECH. Performed at [facility], [address] [place], [place] [address]   Brain natriuretic peptide     Status: Abnormal   Collection Time: 05/24/19  6:17 AM  Result Value Ref Range   B Natriuretic Peptide 1,686.7 (H) 0.0 - 100.0 pg/mL    Comment: Performed at [facility], [address] [place], [place] [address]  Ethanol     Status: None   Collection  Time: 05/24/19  6:18 AM  Result Value Ref Range   Alcohol, Ethyl (B) <10 <10  mg/dL    Comment: (NOTE) Lowest detectable limit for serum alcohol is 10 mg/dL. For medical purposes only. Performed at [facility], [address] [place], [name] [address]   Salicylate level     Status: None   Collection Time: 05/24/19  6:18 AM  Result Value Ref Range   Salicylate Lvl <7.0 2.8 - 30.0 mg/dL    Comment: Performed at [facility], [address], [place], [name] [address]  Acetaminophen level     Status: Abnormal   Collection Time: 05/24/19  6:18 AM  Result Value Ref Range   Acetaminophen (Tylenol), Serum <10 (L) 10 - 30 ug/mL    Comment: (NOTE) Therapeutic concentrations vary significantly. A range of 10-30 ug/mL  may be an effective concentration for many patients. However, some  are best treated at concentrations outside of this range. Acetaminophen concentrations >150 ug/mL at 4 hours after ingestion  and >50 ug/mL at 12 hours after ingestion are often associated with  toxic reactions. Performed at [facility], [address], [place], [name] [address]   Rapid urine drug screen (hospital performed)     Status: Abnormal   Collection Time: 05/24/19  7:16 AM  Result Value Ref Range   Opiates NONE DETECTED NONE DETECTED   Cocaine POSITIVE (A) NONE DETECTED   Benzodiazepines NONE DETECTED NONE DETECTED   Amphetamines NONE DETECTED NONE DETECTED   Tetrahydrocannabinol NONE DETECTED NONE DETECTED   Barbiturates NONE DETECTED NONE DETECTED    Comment: (NOTE) DRUG SCREEN FOR MEDICAL PURPOSES ONLY.  IF CONFIRMATION IS NEEDED FOR ANY PURPOSE, NOTIFY LAB WITHIN 5 DAYS. LOWEST DETECTABLE LIMITS FOR URINE DRUG SCREEN Drug Class                     Cutoff (ng/mL) Amphetamine and metabolites    1000 Barbiturate and metabolites    200 Benzodiazepine                 200 Tricyclics and metabolites     300 Opiates and metabolites        300 Cocaine and metabolites         300 THC                            50 Performed at [facility], [address], [place], [name] [address]   SARS Coronavirus 2 (CEPHEID- Performed in [facility] hospital lab), Hosp Order     Status: None   Collection Time: 05/24/19  8:35 AM   Specimen: Nasopharyngeal Swab  Result Value Ref Range   SARS Coronavirus 2 NEGATIVE NEGATIVE    Comment: (NOTE) If result is NEGATIVE SARS-CoV-2 target nucleic acids are NOT DETECTED. The SARS-CoV-2 RNA is generally detectable in upper and lower  respiratory specimens during the acute phase of infection. The lowest  concentration of SARS-CoV-2 viral copies this assay can detect is 250  copies / mL. A negative result does not preclude SARS-CoV-2 infection  and should not be used as the sole basis for treatment or other  patient management decisions.  A negative result may occur with  improper specimen collection / handling, submission of specimen other  than nasopharyngeal swab, presence of viral mutation(s) within the  areas targeted by this assay, and inadequate number of viral copies  (<250 copies / mL). A negative result must be combined with clinical  observations, patient history, and epidemiological information. If  result is POSITIVE SARS-CoV-2 target nucleic acids are DETECTED. The SARS-CoV-2 RNA is generally detectable in upper and lower  respiratory specimens dur ing the acute phase of infection.  Positive  results are indicative of active infection with SARS-CoV-2.  Clinical  correlation with patient history and other diagnostic information is  necessary to determine patient infection status.  Positive results do  not rule out bacterial infection or co-infection with other viruses. If result is PRESUMPTIVE POSTIVE SARS-CoV-2 nucleic acids MAY BE PRESENT.   A presumptive positive result was obtained on the submitted specimen  and confirmed on repeat testing.  While 2019 novel coronavirus  (SARS-CoV-2) nucleic acids may  be present in the submitted sample  additional confirmatory testing may be necessary for epidemiological  and / or clinical management purposes  to differentiate between  SARS-CoV-2 and other Sarbecovirus currently known to infect humans.  If clinically indicated additional testing with an alternate test  methodology [id number]) is advised. The SARS-CoV-2 RNA is generally  detectable in upper and lower respiratory sp ecimens during the acute  phase of infection. The expected result is Negative. Fact Sheet for Patients:  BoilerBrush.com.cyhttps://www.fda.gov/media/136312/download Fact Sheet for Healthcare Providers: https://pope.com/https://www.fda.gov/media/136313/download This test is not yet approved or cleared by the [place] FDA and has been authorized for detection and/or diagnosis of SARS-CoV-2 by FDA under an Emergency Use Authorization (EUA).  This EUA will remain in effect (meaning this test can be used) for the duration of the COVID-19 declaration under Section 564(b)(1) of the Act, 21 U.S.C. section 360bbb-3(b)(1), unless the authorization is terminated or revoked sooner. Performed at [facility], [address] [place], [place] [address]   CBC     Status: Abnormal   Collection Time: 05/25/19  6:32 AM  Result Value Ref Range   WBC 4.5 4.0 - 10.5 K/uL   RBC 4.50 4.22 - 5.81 MIL/uL   Hemoglobin 14.5 13.0 - 17.0 g/dL   HCT 82.943.5 56.239.0 - 13.052.0 %   MCV 96.7 80.0 - 100.0 fL   MCH 32.2 26.0 - 34.0 pg   MCHC 33.3 30.0 - 36.0 g/dL   RDW 86.515.9 (H) 78.411.5 - 69.615.5 %   Platelets 260 150 - 400 K/uL   nRBC 0.0 0.0 - 0.2 %    Comment: Performed at [facility], [address] [place], [place] [address]  Renal function panel     Status: Abnormal   Collection Time: 05/25/19  6:32 AM  Result Value Ref Range   Sodium 136 135 - 145 mmol/L   Potassium 3.8 3.5 - 5.1 mmol/L   Chloride 104 98 - 111 mmol/L   CO2 23 22 - 32 mmol/L   Glucose, Bld 107 (H) 70 - 99 mg/dL   BUN 10 6 - 20 mg/dL   Creatinine, Ser  4.131.20 0.61 - 1.24 mg/dL   Calcium 8.8 (L) 8.9 - 10.3 mg/dL   Phosphorus 4.6 2.5 - 4.6 mg/dL   Albumin 3.0 (L) 3.5 - 5.0 g/dL   GFR calc non Af Amer >60 >60 mL/min   GFR calc Af Amer >60 >60 mL/min   Anion gap 9 5 - 15    Comment: Performed at [facility], [address] [place], [place] [address]  Troponin I (High Sensitivity)     Status: Abnormal   Collection Time: 05/25/19  6:32 AM  Result Value Ref Range   Troponin I (High Sensitivity) 33 (H) <18 ng/L    Comment: (NOTE) Elevated high sensitivity  troponin I (hsTnI) values and significant  changes across serial measurements may suggest ACS but many other  chronic and acute conditions are known to elevate hsTnI results.  Refer to the "Links" section for chest pain algorithms and additional  guidance. Performed at [facility], [place] [address] [place], [place] [address]     Current Facility-Administered Medications  Medication Dose Route Frequency Provider Last Rate Last Dose  . albuterol (PROVENTIL) (2.5 MG/3ML) 0.083% nebulizer solution 2.5 mg  2.5 mg Nebulization Q2H PRN Tammi Klippel, Sherin, DO      . amLODipine (NORVASC) tablet 10 mg  10 mg Oral Daily Everrett Coombe, MD   10 mg at 05/25/19 1003  . amoxicillin-clavulanate (AUGMENTIN) 875-125 MG per tablet 1 tablet  1 tablet Oral Q12H Caroline More, DO   1 tablet at 05/25/19 1004  . enoxaparin (LOVENOX) injection 40 mg  40 mg Subcutaneous Q24H Everrett Coombe, MD   40 mg at 05/25/19 1005  . folic acid (FOLVITE) tablet 1 mg  1 mg Oral Daily Everrett Coombe, MD   1 mg at 05/25/19 1004  . furosemide (LASIX) tablet 80 mg  80 mg Oral BID Matilde Haymaker, MD      . guaiFENesin (ROBITUSSIN) 100 MG/5ML solution 100 mg  5 mL Oral Q4H PRN Mullis, Kiersten P, DO   100 mg at 05/25/19 0234  . ipratropium-albuterol (DUONEB) 0.5-2.5 (3) MG/3ML nebulizer solution 3 mL  3 mL Nebulization Q4H PRN Caroline More, DO   3 mL at 05/25/19 0417  . lidocaine (LIDODERM) 5 % 1 patch  1 patch Transdermal Q24H  Meccariello, Bernita Raisin, DO   1 patch at 05/25/19 1002  . multivitamin with minerals tablet 1 tablet  1 tablet Oral Daily Everrett Coombe, MD   1 tablet at 05/25/19 1003  . thiamine (VITAMIN B-1) tablet 100 mg  100 mg Oral Daily Everrett Coombe, MD   100 mg at 05/25/19 1005   Or  . thiamine (B-1) injection 100 mg  100 mg Intravenous Daily Everrett Coombe, MD      . traMADol Veatrice Bourbon) tablet 50 mg  50 mg Oral Q6H PRN Mullis, Kiersten P, DO   50 mg at 05/25/19 0234    Musculoskeletal: Strength & Muscle Tone: within normal limits Gait & Station: normal Patient leans: N/A  Psychiatric Specialty Exam: Physical Exam  Nursing note and vitals reviewed. Constitutional: He is oriented to person, place, and time. He appears well-developed and well-nourished.  HENT:  Head: Normocephalic and atraumatic.  Respiratory: Effort normal.  Neurological: He is alert and oriented to person, place, and time.    ROS  Blood pressure (!) 143/100, pulse 90, temperature 98.8 F (37.1 C), temperature source Oral, resp. rate 18, height 5\' 9"  (1.753 m), weight 81.6 kg, SpO2 97 %.Body mass index is 26.58 kg/m.  General Appearance: Casual  Eye Contact:  Good  Speech:  Normal Rate  Volume:  Increased  Mood:  Irritable  Affect:  Congruent  Thought Process:  Coherent and Descriptions of Associations: Circumstantial  Orientation:  Full (Time, Place, and Person)  Thought Content:  Hallucinations: Auditory  Suicidal Thoughts:  Yes.  without intent/plan  Homicidal Thoughts:  No  Memory:  Immediate;   Poor Recent;   Poor Remote;   Poor  Judgement:  Impaired  Insight:  Lacking  Psychomotor Activity:  Increased  Concentration:  Concentration: Fair and Attention Span: Fair  Recall:  [employer] of Knowledge:  Fair  Language:  Good  Akathisia:  Negative  Handed:  Right  AIMS (if indicated):     Assets:  Desire for Improvement Resilience  ADL's:  Intact  Cognition:  WNL  Sleep:        Treatment Plan Summary: Daily  contact with patient to assess and evaluate symptoms and progress in treatment, Medication management and Plan : Patient is a 53 YO male with the above stated PPHx who is seen in consultation.  Patient is probably augmenting symptoms secondary to homelessness etc. It is unclear whether he made it to any of the SA programs that he was referred to after his discharge from [facility]. His LFTs are normal as well as his CBC so restarting his depakote given his CHF and QTc issues is most likely the best plan for now. I do not believe his +AH are a true psychotic illness, and either due to cocaine or augmentation. Abilify would have the least amount of issues with his QTc so I will write for that as well.We will see how his does with these medications.  1) Depakote DR 250 mg po BID 2) Abilify 2 mg po q hs 3) Ativan 1 mg po q 6 hrs prn CIWA>10 or agitation 4) EKG in am to assess effect of meds on QTc 5) Social work to investigate SA treatment options for patient or shelters  Disposition: We will continue to follow with you.  Antonieta PertGreg Lawson Clary, MD 05/25/2019 1:44 PM

## 2019-05-25 NOTE — Progress Notes (Signed)
Dinner tray ordered.

## 2019-05-25 NOTE — Clinical Social Work Note (Signed)
CSW acknowledges consult for substance use. Patient currently on suicide precautions and psychiatrist to see today. Will follow for assessment and recommendations before providing resources. Patient had referral in to [facility] in April until they had a bed open up around May 25. He was supposed to call them once he got a phone that week of discharge to provide them contact information. Unsure if he got the phone or contacted them.  Dayton Scrape, [place]

## 2019-05-25 NOTE — Discharge Summary (Signed)
[facility] Discharge Summary  Patient name: [name] Medical record number: [id number] Date of birth: 1966-08-17 Age: 53 y.o. Gender: male Date of Admission: 05/24/2019  Date of Discharge: 05/26/2019 Admitting Physician: Kinnie Feil, MD  Primary Care Provider: Medicine, [facility] Family Consultants: Psych  Indication for Hospitalization: SI and Chest Pain  Discharge Diagnoses/Problem List:  ACS rule out Schizoaffective disorder endorsing suicidal ideation Chronic severe HFrEF History of syphilis Hypertension History of substance abuse Homelessness  Disposition: self-care  Discharge Condition: stable  Discharge Exam:   Physical Exam:  General: 53 y.o. male in NAD Cardio: RRR no m/r/g Lungs: Good air movement throughout all lung fields, scattered rhonchi, no wheezing Abdomen: Soft, non-tender to palpation, non-distended, positive bowel sounds Skin: warm and dry Extremities: No edema Psych: Agreeable and cooperative, endorses SI  Brief Hospital Course:  [name] a 53 y.o.malepresenting with dyspnea and elevated troponin. PMH is significant for Severe HFrEF (15% EF in 03/2019), cocaine abuse, alcohol abuse, homelessness.  His hospital course is outlined below.  ACS rule out  HFrEF Patient admitted with left upper chest to left shoulder pain.  Troponin 0 0.05 on admission, high-sensitivity troponin repeated and was 33, per sensitivities, is 0.05 troponin is equal to about 44 on high-sensitivity troponin.  Therefore this high-sensitivity troponin was decreased.  Patient refused any other lab draws.  Pain continued to be reproducible to palpation, worsened with movement of his left arm, patient also had tenderness to palpation of left biceps tendon and positive speeds test.  His pain was therefore determined to be musculoskeletal in origin.  Cardiology was consulted, given patient's elevated troponin as well as history of severe  HFrEF echo in April 2020 with EF of 15% and diffuse LV hypokinesis.  Patient had also reported taking more Lasix than needed.  He did have some crackles on lung exam, bilateral lower extremities were without edema.  His home Lasix dose was increased to 80 mg p.o. twice daily, then decreased to 80 mg daily due to increase in Cr.  Patient refused cardiac intervention while inpatient, but will refer as outpatient to cardiology for continued follow-up and possible medication titration if patient keeps follow-up appointments.  Suicidal ideation Patient endorsed SI with plan on admission.  Psychiatry was consulted and saw patient while inpatient.  Started on Abilify, Depakote, but refused these medications.  He was cleared by psychiatry for discharge and referral was sent to outpatient substance abuse treatment.  Substance abuse Patient has a history of substance abuse and UDS was positive for cocaine on admission.  Social work was consulted and patient was counseled on cessation as well as substance abuse programs. Referral sent at discharge.  Issues for Follow Up:  1. 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  Confirmed that patient has been previously treated for syphilis with the health department, and has titers stable at that level since 2015.  No further follow-up needed for syphilis. 2. Was referred to outpatient substance abuse treatment 3. Recommend follow up with cardiology as outpatient for HFrEF  Significant Procedures: None  Significant Labs and Imaging:  Recent Labs  Lab 05/24/19 0617 05/25/19 0632 05/26/19 0801  WBC 5.7 4.5 5.2  HGB 14.1 14.5 15.2  HCT 42.4 43.5 45.5  PLT 263 260 265   Recent Labs  Lab 05/24/19 0617 05/25/19 0632 05/26/19 0801  NA 139 136 138  K 3.6 3.8 3.9  CL 104 104 103  CO2 24 23 26   GLUCOSE  87 107* 87  BUN 11 10 13   CREATININE 1.47* 1.20 1.51*  CALCIUM 8.9 8.8* 8.8*  PHOS  --  4.6  --   ALKPHOS  --   --  77  AST  --   --  20  ALT   --   --  16  ALBUMIN  --  3.0* 2.9*    Dg Chest Portable 1 View  Result Date: 05/24/2019 CLINICAL DATA:  Shortness of breath. EXAM: PORTABLE CHEST 1 VIEW COMPARISON:  CT 05/12/2019. Chest x-ray 05/12/2019, 03/04/2019, 05/28/2017. FINDINGS: Mediastinum hilar structures normal. Stable severe cardiomegaly. No pulmonary venous congestion. Chronic right base pleural-parenchymal thickening most consistent scarring again noted. Interim change no acute infiltrate. IMPRESSION: 1.  Stable severe cardiomegaly.  No pulmonary venous congestion. 2. Chronic right base pleural-parenchymal thickening most consistent scarring again noted. Electronically Signed   By: Maisie Fus  Register   On: 05/24/2019 06:53   Results/Tests Pending at Time of Discharge: None  Discharge Medications:  Allergies as of 05/26/2019      Reactions   Acetaminophen Hives, Nausea And Vomiting, Rash   Pt overdosed on bottle of APAP 05/06/16   Latex Rash      Medication List    STOP taking these medications   amoxicillin 500 MG capsule Commonly known as: AMOXIL   potassium chloride SA 20 MEQ tablet Commonly known as: K-DUR     TAKE these medications   albuterol 108 (90 Base) MCG/ACT inhaler Commonly known as: VENTOLIN HFA Inhale 1-2 puffs into the lungs every 4 (four) hours as needed for shortness of breath.   amLODipine 10 MG tablet Commonly known as: NORVASC Take 1 tablet (10 mg total) by mouth daily.   amoxicillin-clavulanate 875-125 MG tablet Commonly known as: AUGMENTIN Take 1 tablet by mouth every 12 (twelve) hours.   ARIPiprazole 2 MG tablet Commonly known as: ABILIFY Take 1 tablet (2 mg total) by mouth at bedtime.   divalproex 250 MG DR tablet Commonly known as: DEPAKOTE Take 1 tablet (250 mg total) by mouth every 12 (twelve) hours.   folic acid 1 MG tablet Commonly known as: FOLVITE Take 1 tablet (1 mg total) by mouth daily.   furosemide 40 MG tablet Commonly known as: Lasix Take 1 tablet (40 mg total) by  mouth 2 (two) times daily for 30 days.   furosemide 40 MG tablet Commonly known as: LASIX Take 1 tablet (40 mg total) by mouth 2 (two) times daily.   ipratropium 0.02 % nebulizer solution Commonly known as: ATROVENT Take 2.5 mLs (0.5 mg total) by nebulization every 6 (six) hours as needed for wheezing or shortness of breath.   thiamine 100 MG tablet Take 1 tablet (100 mg total) by mouth daily.       Discharge Instructions: Please refer to Patient Instructions section of EMR for full details.  Patient was counseled important signs and symptoms that should prompt return to medical care, changes in medications, dietary instructions, activity restrictions, and follow up appointments.   Follow-Up Appointments:   Unknown Jim, DO 05/27/2019, 7:07 PM PGY-1, [facility]

## 2019-05-25 NOTE — Progress Notes (Signed)
Pt lunch tray ordered.

## 2019-05-25 NOTE — Progress Notes (Signed)
Pt speaking to Dr right now

## 2019-05-25 NOTE — Progress Notes (Signed)
Pt given fan upon request

## 2019-05-25 NOTE — Progress Notes (Signed)
Pt eating breakfast tray 

## 2019-05-25 NOTE — Progress Notes (Signed)
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  She noted that patient has had titers at this level since 2015, and was previously treated.  She confirmed with health department.  Patient has been treated for syphilis, no follow-up needed.  [place] Constable, D.O.  PGY-1 Family Medicine  05/25/2019 2:53 PM

## 2019-05-25 NOTE — Progress Notes (Signed)
Pt refused to let me check his leads

## 2019-05-25 NOTE — Progress Notes (Signed)
Pt more calm since talking to Dr

## 2019-05-25 NOTE — Progress Notes (Signed)
Pt increasingly agitated wishing to speak to doctor.

## 2019-05-25 NOTE — Progress Notes (Addendum)
Family Medicine Teaching Service Daily Progress Note Intern Pager: [phone]  Patient name: [name] Medical record number: [id number] Date of birth: 07/04/1966 Age: 53 y.o. Gender: male  Primary Care Provider: Medicine, [facility] Family Consultants: Psych Code Status: Full  Pt Overview and Major Events to Date:  6/23 Admitted to FPTS  Assessment and Plan: Catcher Hoerig is a 53 y.o. male presenting with dyspnea and elevated troponin . PMH is significant for Severe HFrEF (15% EF in 03/2019), cocaine abuse, alcohol abuse, homelessness  ACS rule out Musculoskeletal pain most likely cause.  Suspicion for ACS very low given pain is reproducible to palpation, worse with movement of arm, located more over left shoulder.  Patient troponin minimally elevated to the 0.05 on admission.  Patient has been refusing some lab draws, agreed to get troponin this a.m.  High-sensitivity troponin this a.m. 33, per sensitivities, 0.05 troponin about 44 and high-sensitivity, therefore is decreased.  A.m. EKG also refused.  Patient was placed on nasal cannula yesterday and was increased to 2 L this a.m., no desaturation noted, patient had reported some shortness of breath.  Patient now breathing comfortably on room air.  Does have crackles at bases of lungs.  Tenderness to palpation of left biceps tendon, positive speeds test. - monitor continuous O2, wean O2 as able - avoid beta blocker with cocaine + UDS -Given patient's history of significant reduced ejection fraction and elevated troponin, will consult cardiology while inpatient to ensure that he has outpatient follow-up as well  Schizoeffective disorder, endorsing suicidal ideation  Patient has a history of behavioral health admissions.  Had endorsed SI with plan on admission.  Psychiatry consulted on 6/23, planning to see today.  This a.m. continues to endorse SI. - consult to psychiatry - safety sitter  Chronic, severe HFrEF  Most recent echo  03/2019 with EF of 15% and diffuse LV hypokinesis.  Patient does not appear fluid overloaded on exam, and weight on admission was down from last 2 hospitalizations, 81 from 83.  BNP elevated to 1600, stable from previous hospitalizations.  Patient does not appear fluid overloaded on exam and chest x-ray without pulmonary venous congestion.  He reports taking 2 times his home dose of Lasix.  He received 20 mg IV Lasix in ED, placed back on home dose of 40 mg twice daily.  Plan to call heart failure clinic today to establish patient and get him a follow-up appointment.  UOP in last 24 hours 2.3 L.  Patient was reporting some shortness of breath this morning, no desaturation noted, is currently on 2 L per [place].  On exam, patient has no bilateral lower extremity edema, does have bibasilar crackles on exam. -Given crackles on exam and increase at home, will increase Lasix to 80mg  BID - monitor O2 sat - daily weight, refused this a.m. 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  Patient was discharged AMA after this resulted. -will consult ID to decide if needs treatment or rehceck  HTN - lisinopril and norvasc noted in previous admit notes to internal med. Lisinopril no longer on med list. BP 149/110. - continue norvasc10 mg - diurese as above  Hx substance abuse - cocaine on UDS, ethanol use noted in chart. - monitor on CIWA: highest overnight, 5 - cocaine cessation counseling  Homelessness - consult to SW  FEN/GI: Heart healthy PPx:  Lovenox  Disposition: pending psych consult today  Subjective:  Patient continues to endorse SI this morning.  Was questioning why he was having his blood drawn for many times overnight.  Notes that he continues to have left-sided pain that is in his upper left chest/shoulder.  States it is worse with movement of his  shoulder, also tender to palpation.  Denies any other complaints.  States that his breathing is currently fine.  Objective: Pulse Rate:  [82-102] 98 (06/24 0553) Resp:  [18-34] 18 (06/24 0553) BP: (112-177)/(76-150) 149/110 (06/24 0553) SpO2:  [98 %-100 %] 100 % (06/23 2042) Weight:  [81.6 kg] 81.6 kg (06/23 0825)  Physical Exam:  General: 53 y.o. male in NAD Cardio: RRR no m/r/g Lungs: Bibasilar crackles, no increased work of breathing on room air Abdomen: Soft, non-tender to palpation, non-distended, positive bowel sounds Skin: warm and dry Extremities: No edema, tenderness to palpation of left biceps tendon, pain with flexion of left arm, positive speeds test on the left  Laboratory: Recent Labs  Lab 05/24/19 0617  WBC 5.7  HGB 14.1  HCT 42.4  PLT 263   Recent Labs  Lab 05/24/19 0617  NA 139  K 3.6  CL 104  CO2 24  BUN 11  CREATININE 1.47*  CALCIUM 8.9  GLUCOSE 87    Imaging/Diagnostic Tests: No results found.   Raegen Tarpley, Bernita Raisin, DO 05/25/2019, 7:22 AM PGY-1, [place] Intern pager: [phone], text pages welcome

## 2019-05-25 NOTE — Progress Notes (Signed)
Pt refused O2 and temp

## 2019-05-25 NOTE — Progress Notes (Signed)
Pt eating lunch tray  

## 2019-05-25 NOTE — Progress Notes (Signed)
Pt's cough suppressed this AM, however pt still c/o 7/10 pain across chest and in left anterior middle trapezius area while awake. Pt refused AM EKG.

## 2019-05-25 NOTE — Consult Note (Addendum)
Cardiology Consultation:   Patient ID: [name] MRN: [id number]; DOB: [birth year]  Admit date: 05/24/2019 Date of Consult: 05/25/2019  Primary Care Provider: [facility], [facility] Cardiologist: New Primary Electrophysiologist:  None    Patient Profile:   [name] is a 53 y.o. male [facility] with a PMH of systolic CHF, HTN, homelessness, Bipolar 1 disorder and Schizoaffective disorder, QT prolongation who is being seen today for the evaluation chest pain and elevated troponin at the request of Dr. Lum BabeEniola, Internal Medicine.   History of Present Illness:   [name] is a 53 y.o. male [facility] with a reported PMH of systolic CHF, HTN, homelessness, Bipolar 1 disorder and Schizoaffective disorder who is being seen today for the evaluation chest pain at the request of Dr. Lum BabeEniola, Internal Medicine.   Per records, [facility]F, in the setting of medication non adherance. He was treated w/ IV lasix. 2D echo 03/28/19 that showed severe LV dysfunction w/ EF of 15 and diffuse LV hypokinesis. It does not appear that cardiology was ever consulted that admission. It appears that he left AMA and did not wait for prescriptions for medications. Notes indicated that he did not want to see a cardiologist in the past.   [facility] w/ CC of SOB and atypical CP. Also noted suicidal ideation. He was found to have an elevated high sensitivity troponin at 33. Troponin I 0.05. BNP 1,686 (chronically elevated). Also markedly hypertensive. BP 177/150 earlier. Most recent BP 138/98. EKG shows NSR w/ LVH, bi atrial enlargement. And Prolong QT/QTc 422/521 ms.  He is a poor historian and not fully cooperating w/ physical exam. He will allow chest and lung ausculation, but unable to examine LE for edema. He answers a few questions briefly. He would not comment on family h/o when asked. He states "I  don't know, I don't mess with my family".  His chest pain is in his left upper chest/ neck/ shoulder area. More c/w musculoskeletal etiology. Reproducible with palpation. Pleuritic. Hurts to take in a deep breath. Worsen by physical movement or if he lays in on position for too long on his side. Gets better w/ massage. No improvement however w/ Toradol. He also states that he is chronically SOB, but dyspnea got worse yesterday. Cannot lay flat. Reports full compliance at home w/ Lasix. Denies [name]. Would not allow for exam of legs. Admits to smoking about 1 ppd.   Has sitter at bedside for suicidal ideation.   Heart Pathway Score:     Past Medical History:  Diagnosis Date  . Asthma   . Bipolar 1 disorder (HCC)   . CHF (congestive heart failure) (HCC)   . Depression   . Homelessness   . Hypertension   . QT prolongation   . Schizoaffective disorder (HCC)     History reviewed. No pertinent surgical history.   Home Medications:  Prior to Admission medications   Medication Sig Start Date End Date Taking? Authorizing Provider  furosemide (LASIX) 40 MG tablet Take 1 tablet (40 mg total) by mouth 2 (two) times daily. 05/12/19  Yes Cathren LaineSteinl, Kevin, MD  albuterol (PROVENTIL HFA;VENTOLIN HFA) 108 (90 Base) MCG/ACT inhaler Inhale 1-2 puffs into the lungs every 4 (four) hours as needed for shortness of breath. Patient not taking: Reported on 05/24/2019 08/22/18   Dione BoozeGlick, [name], MD  amLODipine (NORVASC) 10 MG tablet Take 1  tablet (10 mg total) by mouth daily. Patient not taking: Reported on 05/12/2019 04/06/19   Ali LoweVogel, Marie S, MD  amoxicillin (AMOXIL) 500 MG capsule Take 1 capsule (500 mg total) by mouth 3 (three) times daily. Patient not taking: Reported on 05/24/2019 05/12/19   Cathren LaineSteinl, Kevin, MD  furosemide (LASIX) 40 MG tablet Take 1 tablet (40 mg total) by mouth 2 (two) times daily for 30 days. Patient not taking: Reported on 05/24/2019 04/19/19 05/19/19  Theotis BarrioLee, Joshua K, MD  ipratropium (ATROVENT) 0.02 %  nebulizer solution Take 2.5 mLs (0.5 mg total) by nebulization every 6 (six) hours as needed for wheezing or shortness of breath. Patient not taking: Reported on 05/12/2019 03/09/19   Claudean SeveranceKrienke, Marissa M, MD  potassium chloride SA (K-DUR) 20 MEQ tablet Take 1 tablet (20 mEq total) by mouth daily. Patient not taking: Reported on 05/24/2019 05/12/19   Cathren LaineSteinl, Kevin, MD    Inpatient Medications: Scheduled Meds: . amLODipine  10 mg Oral Daily  . amoxicillin-clavulanate  1 tablet Oral Q12H  . ARIPiprazole  2 mg Oral NOW  . ARIPiprazole  2 mg Oral QHS  . divalproex  250 mg Oral Q12H  . enoxaparin (LOVENOX) injection  40 mg Subcutaneous Q24H  . folic acid  1 mg Oral Daily  . furosemide  80 mg Oral BID  . lidocaine  1 patch Transdermal Q24H  . multivitamin with minerals  1 tablet Oral Daily  . thiamine  100 mg Oral Daily   Or  . thiamine  100 mg Intravenous Daily   Continuous Infusions:  PRN Meds: albuterol, guaiFENesin, ipratropium-albuterol, LORazepam, menthol-cetylpyridinium, Muscle Rub, traMADol  Allergies:    Allergies  Allergen Reactions  . Acetaminophen Hives, Nausea And Vomiting and Rash    Pt overdosed on bottle of APAP 05/06/16   . Latex Rash    Social History:   Social History   Socioeconomic History  . Marital status: Single    Spouse name: Not on file  . Number of children: Not on file  . Years of education: Not on file  . Highest education level: Not on file  Occupational History  . Not on file  Social Needs  . Financial resource strain: Not on file  . Food insecurity    Worry: Not on file    Inability: Not on file  . Transportation needs    Medical: Not on file    Non-medical: Not on file  Tobacco Use  . Smoking status: Current Every Day Smoker    Years: 30.00    Types: Cigarettes  . Smokeless tobacco: Never Used  Substance and Sexual Activity  . Alcohol use: Yes    Comment: infrequently  . Drug use: Yes    Types: Cocaine, Marijuana  . Sexual activity:  Yes    Birth control/protection: None  Lifestyle  . Physical activity    Days per week: Not on file    Minutes per session: Not on file  . Stress: Not on file  Relationships  . Social Musicianconnections    Talks on phone: Not on file    Gets together: Not on file    Attends religious service: Not on file    Active member of club or organization: Not on file    Attends meetings of clubs or organizations: Not on file    Relationship status: Not on file  . Intimate partner violence    Fear of current or ex partner: Not on file    Emotionally abused: Not on  file    Physically abused: Not on file    Forced sexual activity: Not on file  Other Topics Concern  . Not on file  Social History Narrative  . Not on file    Family History:    Family History  Problem Relation Age of Onset  . Drug abuse Mother   . Suicidality Mother      ROS:  Please see the history of present illness.   All other ROS reviewed and negative.     Physical Exam/Data:   Vitals:   05/25/19 0019 05/25/19 0553 05/25/19 1123 05/25/19 1613  BP: 129/87 (!) 149/110 (!) 143/100 (!) 138/98  Pulse: 95 98 90 93  Resp: 20 18 18 15   Temp:      TempSrc:      SpO2:   97%   Weight:      Height:        Intake/Output Summary (Last 24 hours) at 05/25/2019 1726 Last data filed at 05/25/2019 1231 Gross per 24 hour  Intake 1440 ml  Output 850 ml  Net 590 ml   Last 3 Weights 05/25/2019 05/25/2019 05/24/2019  Weight (lbs) (No Data) (No Data) 180 lb  Weight (kg) (No Data) (No Data) 81.647 kg  Some encounter information is confidential and restricted. Go to Review Flowsheets activity to see all data.     Body mass index is 26.58 kg/m.  General:  Well nourished, well developed, in no acute distress HEENT: normal Lymph: no adenopathy Neck: no JVD Endocrine:  No thryomegaly Vascular: No carotid bruits; FA pulses 2+ bilaterally without bruits  Cardiac:  normal S1, S2; RRR; no murmur  Lungs:  Bilateral diffuse expiratory  wheezing Abd: soft, nontender, no hepatomegaly  Ext: no edema Musculoskeletal:  No deformities, BUE and BLE strength normal and equal Skin: warm and dry  Neuro:  CNs 2-12 intact, no focal abnormalities noted Psych:  Normal affect   EKG:  The EKG was personally reviewed and demonstrates:  EKG shows NSR w/ LVH, bi atrial enlargement. QT/QTc 422/521 ms Telemetry:  Telemetry was personally reviewed and demonstrates:  Sinus tach 110s.  Relevant CV Studies: 2D Echo 03/28/19 IMPRESSIONS    1. The left ventricle had a visually estimated ejection fraction of of 15%. The cavity size was mildly dilated. Left ventricular diastolic Doppler parameters are consistent with pseudonormal. Left ventricular diffuse hypokinesis.  2. The right ventricle has moderately reduced systolic function. The cavity was mildly enlarged. There is no increase in right ventricular wall thickness.  3. No evidence of mitral valve stenosis. Trivial mitral regurgitation.  4. The aortic valve is tricuspid No stenosis of the aortic valve.  5. There is dilatation of the aortic root.  6. The IVC is normal in size with PA systolic pressure 59 mmHg.  7. The aortic root is normal in size and structure.  Laboratory Data:  High Sensitivity Troponin:   Recent Labs  Lab 05/25/19 0632  TROPONINIHS 33*     Cardiac Enzymes Recent Labs  Lab 05/24/19 0617  TROPONINI 0.05*   No results for input(s): TROPIPOC in the last 168 hours.  Chemistry Recent Labs  Lab 05/24/19 0617 05/25/19 0632  NA 139 136  K 3.6 3.8  CL 104 104  CO2 24 23  GLUCOSE 87 107*  BUN 11 10  CREATININE 1.47* 1.20  CALCIUM 8.9 8.8*  GFRNONAA 54* >60  GFRAA >60 >60  ANIONGAP 11 9    Recent Labs  Lab 05/25/19 0632  ALBUMIN 3.0*  Hematology Recent Labs  Lab 05/24/19 0617 05/25/19 0632  WBC 5.7 4.5  RBC 4.28 4.50  HGB 14.1 14.5  HCT 42.4 43.5  MCV 99.1 96.7  MCH 32.9 32.2  MCHC 33.3 33.3  RDW 16.2* 15.9*  PLT 263 260   BNP Recent  Labs  Lab 05/24/19 0617  BNP 1,686.7*    DDimer No results for input(s): DDIMER in the last 168 hours.   Radiology/Studies:  Dg Chest Portable 1 View  Result Date: 05/24/2019 CLINICAL DATA:  Shortness of breath. EXAM: PORTABLE CHEST 1 VIEW COMPARISON:  CT 05/12/2019. Chest x-ray 05/12/2019, 03/04/2019, 05/28/2017. FINDINGS: Mediastinum hilar structures normal. Stable severe cardiomegaly. No pulmonary venous congestion. Chronic right base pleural-parenchymal thickening most consistent scarring again noted. Interim change no acute infiltrate. IMPRESSION: 1.  Stable severe cardiomegaly.  No pulmonary venous congestion. 2. Chronic right base pleural-parenchymal thickening most consistent scarring again noted. Electronically Signed   By: Maisie Fus  Register   On: 05/24/2019 06:53    Assessment and Plan:   Hercules Agen is a 53 y.o. male new to [facility] with a reported PMH of systolic CHF, QT prolongation HTN, homelessness, Bipolar 1 disorder and Schizoaffective disorder who is being seen today for the evaluation chest pain and elevation at the request of Dr. Lum Babe, Internal Medicine.   1. Chest Pain: his chest pain is in his left upper chest/ neck/ shoulder area. More c/w musculoskeletal etiology. Reproducible with palpation. Pleuritic. Hurts to take in a deep breath. Worsen by physical movement or if he lays in a position for too long on his side. Gets better w/ massage. No improvement however w/ Toradol. Symptoms not c/w ischemic CP. EKG also w/o acute ST changes. hsT level most c/w demand ischemia in the setting of acute CHF and HTN. Not diagos Initial hsT 33 and pt will refuses further blood draws. No further w/u planned.   2. Acute on Chronic Systolic HF: Chronic systolic HF. EF was 15% in April of this year. No prior echo for comparison. Given h/o mental illness and poor compliance, he is not a candidate for advanced HF therapies. His BNP was elevated on admit at 1,686 and he was given IV  Lasix, 20 mg, and placed back on PO lasix today. Standing dose increased from 40 BID to 80 mg BID. Has diuresed 2.3L. Does not appear grossly volume overloaded on exam, although he would not allow for examination of lower extremities. Recommend continuation of diuretics. As noted above, he had not been compliant w/ other meds. No plans for  blocker due to cocaine use. +UDS this admit.   3. Elevated Troponin: initial hsT 33. He refuses additional blood draws. Based on EKG and lack of ischemic like chest pain, suspect demand ischemia. No plans for further ischemic w/u.   4. Prolonged QT:  422/521 ms on EKG. Monitor psych meds. On Abilify. Also need to avoid tramadol as this could also cause QT prolongation. Monitor K while treating with lasix. K was WNL today.   For questions or updates, please contact [facility] Please consult [email or web address] for contact info under   Signed, Robbie Lis, PA-C  05/25/2019 5:26 PM   History and all data above reviewed.  Patient examined.  I agree with the findings as above.  We are consulted with his elevated hsTrop.  Also, he has chest pain and SOB.  He is very angry during this interview and will not answer many questions or allow for a complete exam.   He says that  he came in because he could not breath.  He was not reporting PND or orthopnea or swelling.  He felt congestion and fluid in his chest and his Lasix was not working even though he increased this.  The patient exam reveals COR:RRR  ,  Lungs: Decreased breath sounds  ,  Abd: Positive bowel sounds, no rebound no guarding, Ext No edema  .  All available labs, radiology testing, previous records reviewed. Agree with documented assessment and plan.   Chest pain: Atypical.  Troponin is non diagnostic.  He will not allow serial enzymes and is not allowing other interventions at this point.  No further testing.  HF:  He is diuresing well and seems to comply with his Lasix.  We can try to start meds at  discharge and arrange follow up.  I will discuss with him in the AM if he is more receptive.    Jeneen Rinks Carlee Vonderhaar  6:24 PM  05/25/2019

## 2019-05-25 NOTE — Progress Notes (Addendum)
Patient asked for pain med.  RN took pain med to the patient.  Patient started complaining, wanting to talk to the doctor.  RN asked patient what the concern was so she can relay to the doctor.  Patient stated it is personal.  RN told patient doctor was going to be paged. Patient got upset abruptly, told RN to get out of the room. Patient stated "get out I will wait for the next shift". Patient refused physical assessment earlier this morning.  Marcille Blanco, RN   [phone]: MD made aware of patient's wish to see them.  MD will be in to see patient . Patient notified.  Marcille Blanco, RN

## 2019-05-25 NOTE — Progress Notes (Signed)
Pt very restless and c/o SOB early this AM. O2 increased from 1L to 2L [place]. PRN Duoneb given. Pt able to sleep very comfortably. Will continue to monitor.

## 2019-05-25 NOTE — Progress Notes (Signed)
[employer] called to request double portion for pt dinner tray

## 2019-05-25 NOTE — Progress Notes (Signed)
Pt verbally aggressive with NT sitter since beginning of night shift, calling her a "bitch" multiple times. NT sitter called [facility] for assignment change. Male NT sitter provided.   Pt refused PM vital signs. Pt refused Depakote and Abilify tonight. Pt now extremely selective with who he will allow to speak to/ take care of him.   Pt wants his peripheral IV out. Pt started taking IV out himself, but NT sitter advised him not to. RN came to pt's room to offer gauze and tape. Pt refused at this time. Will continue to monitor

## 2019-05-25 NOTE — Progress Notes (Signed)
Nurse aware of pt's request

## 2019-05-26 DIAGNOSIS — F19188 Other psychoactive substance abuse with other psychoactive substance-induced disorder: Secondary | ICD-10-CM

## 2019-05-26 DIAGNOSIS — F319 Bipolar disorder, unspecified: Secondary | ICD-10-CM

## 2019-05-26 DIAGNOSIS — R9431 Abnormal electrocardiogram [ECG] [EKG]: Secondary | ICD-10-CM

## 2019-05-26 DIAGNOSIS — F339 Major depressive disorder, recurrent, unspecified: Secondary | ICD-10-CM

## 2019-05-26 LAB — COMPREHENSIVE METABOLIC PANEL
ALT: 16 U/L (ref 0–44)
AST: 20 U/L (ref 15–41)
Albumin: 2.9 g/dL — ABNORMAL LOW (ref 3.5–5.0)
Alkaline Phosphatase: 77 U/L (ref 38–126)
Anion gap: 9 (ref 5–15)
BUN: 13 mg/dL (ref 6–20)
CO2: 26 mmol/L (ref 22–32)
Calcium: 8.8 mg/dL — ABNORMAL LOW (ref 8.9–10.3)
Chloride: 103 mmol/L (ref 98–111)
Creatinine, Ser: 1.51 mg/dL — ABNORMAL HIGH (ref 0.61–1.24)
GFR calc Af Amer: >60 mL/min (ref >60)
GFR calc non Af Amer: 53 mL/min — ABNORMAL LOW (ref >60)
Glucose, Bld: 87 mg/dL (ref 70–99)
Potassium: 3.9 mmol/L (ref 3.5–5.1)
Sodium: 138 mmol/L (ref 135–145)
Total Bilirubin: 0.7 mg/dL (ref 0.3–1.2)
Total Protein: 6.4 g/dL — ABNORMAL LOW (ref 6.5–8.1)

## 2019-05-26 LAB — CBC
HCT: 45.5 % (ref 39.0–52.0)
Hemoglobin: 15.2 g/dL (ref 13.0–17.0)
MCH: 32.2 pg (ref 26.0–34.0)
MCHC: 33.4 g/dL (ref 30.0–36.0)
MCV: 96.4 fL (ref 80.0–100.0)
Platelets: 265 10*3/uL (ref 150–400)
RBC: 4.72 MIL/uL (ref 4.22–5.81)
RDW: 15.9 % — ABNORMAL HIGH (ref 11.5–15.5)
WBC: 5.2 10*3/uL (ref 4.0–10.5)
nRBC: 0 % (ref 0.0–0.2)

## 2019-05-26 MED ORDER — AMOXICILLIN-POT CLAVULANATE 875-125 MG PO TABS: 1.0000 | ORAL_TABLET | Freq: Two times a day (BID) | ORAL | 0 refills | Status: DC

## 2019-05-26 MED ORDER — FOLIC ACID 1 MG PO TABS: 1.0000 mg | ORAL_TABLET | Freq: Every day | ORAL | 0 refills | Status: DC

## 2019-05-26 MED ORDER — THIAMINE HCL 100 MG PO TABS: 100.0000 mg | ORAL_TABLET | Freq: Every day | ORAL | 0 refills | Status: DC

## 2019-05-26 MED ORDER — FUROSEMIDE 80 MG PO TABS: 80.0000 mg | ORAL_TABLET | Freq: Every day | ORAL | Status: DC

## 2019-05-26 MED ORDER — DIVALPROEX SODIUM 250 MG PO DR TAB: 250.0000 mg | DELAYED_RELEASE_TABLET | Freq: Two times a day (BID) | ORAL | 0 refills | Status: DC

## 2019-05-26 MED ORDER — FUROSEMIDE 40 MG PO TABS: 40.0000 mg | ORAL_TABLET | Freq: Two times a day (BID) | ORAL | Status: DC

## 2019-05-26 MED ORDER — ARIPIPRAZOLE 2 MG PO TABS: 2.0000 mg | ORAL_TABLET | Freq: Every day | ORAL | 0 refills | Status: DC

## 2019-05-26 NOTE — Progress Notes (Addendum)
Family Medicine Teaching Service Daily Progress Note Intern Pager: [phone]  Patient name: [name] Medical record number: [id number] Date of birth: Jun 15, 1966 Age: 53 y.o. Gender: male  Primary Care Provider: Medicine, [facility] Consultants: Psych Code Status: Full  Pt Overview and Major Events to Date:  6/23 Admitted to FPTS  Assessment and Plan: Dayvian Niemeyer is a 53 y.o. male presenting with dyspnea and elevated troponin . PMH is significant for Severe HFrEF (15% EF in 03/2019), cocaine abuse, alcohol abuse, homelessness  ACS rule out Continues to be very atypical in nature.  Cardiology was consulted.  Troponins were nondiagnostic.  Given this and patient not allowing other interventions, no further testing needed.  A.m. EKG pending this a.m. to watch QTC.  Patient notes that his left shoulder pain was improved with BenGay and Toradol x1. - monitor continuous O2, wean O2 as able - avoid beta blocker with cocaine + UDS -Cardiology consulted, appreciate recommendations  Schizoeffective disorder, endorsing suicidal ideation  Seen by psychiatry on 6/24.  Started on Abilify, Depakote, Ativan as needed for CIWA greater than 10.  Per psychiatry note, it is likely that patient's homelessness is likely augmenting his symptoms.  EKG for QTC pending this morning.  Psychiatry plans to continue to follow. Continues to endorse SI this AM. - consult to psychiatry - safety sitter  Chronic, severe HFrEF  Most recent echo 03/2019 with EF of 15% and diffuse LV hypokinesis.  Patient's Lasix p.o. was increased to 80 mg twice daily from 40 mg twice daily at home.  No output recorded, patient refusing weights.  Per cardiology, plan to see again this a.m. and likely arrange follow-up.  Patient does have rhonchi on exam, and a significant smoking history, with productive clear sputum, suspect underlying COPD as well, which is noted in his chart.    Given that he continues to feel "my chest is  full" and reports a chronic productive cough, will continue treatment for COPD with albuterol and duo nebs PRN, but do not believe he is having a COPD exacerbation.  Patient did have scarring noted on chest x-ray, which could be contributing as well. -Cardiology has signed off, follow up outpatient, will place referral at discharge -Continue Lasix 80 daily - monitor O2 sat - daily weight, has been refusing - strict I/O - monitor Cr on AM BMP, no labs collected this a.m. - consider palliative consult   History of syphilis, treated See note from 6/24, ID pharmacist confirmed with health department that patient has been treated appropriately for syphilis and titers have remained about 1:1 since 2015. -No further intervention needed  HTN - lisinopril and norvasc noted in previous admit notes to internal med. Lisinopril no longer on med list.  Patient has been refusing vitals overnight. - continue norvasc10 mg - diurese as above  Hx substance abuse - cocaine on UDS, ethanol use noted in chart. - monitor on CIWA: 5, 9, 9 - cocaine cessation counseling  Homelessness - consult to SW  FEN/GI: Heart healthy PPx: Lovenox  Disposition: medically stable for discharge, will address dispo plan with psychiatry  Subjective:  Patient states that his left arm pain is improved from yesterday.  Continues to endorse SI.  States that he would like his IV removed and would like to go to a psychiatry facility.  Continues to endorse that his chest "feels full"  Objective: Pulse Rate:  [90-93] 93 (06/24 1613) Resp:  [15-18] 15 (06/24 1613) BP: (138-143)/(98-100) 138/98 (06/24 1613) SpO2:  [97 %]  97 % (06/24 1123)  Physical Exam:  General: 53 y.o. male in NAD Cardio: RRR no m/r/g Lungs: Good air movement throughout all lung fields, scattered rhonchi, no wheezing Abdomen: Soft, non-tender to palpation, non-distended, positive bowel sounds Skin: warm and dry Extremities: No edema Psych: Agreeable  and cooperative, endorses SI  Laboratory: Recent Labs  Lab 05/24/19 0617 05/25/19 0632  WBC 5.7 4.5  HGB 14.1 14.5  HCT 42.4 43.5  PLT 263 260   Recent Labs  Lab 05/24/19 0617 05/25/19 0632  NA 139 136  K 3.6 3.8  CL 104 104  CO2 24 23  BUN 11 10  CREATININE 1.47* 1.20  CALCIUM 8.9 8.8*  GLUCOSE 87 107*    Imaging/Diagnostic Tests: No results found.   Fowler, DO 05/26/2019, 7:35 AM PGY-1, [place] Intern pager: [phone], text pages welcome

## 2019-05-26 NOTE — Progress Notes (Addendum)
Pt refused AM vital signs, daily weight, and labs. Phlebotomy to re-attempt lab draw at 0800. Will continue to monitor.

## 2019-05-26 NOTE — TOC Transition Note (Signed)
Transition of Care [facility]) - CM/SW Discharge Note   Patient Details  Name: [name] MRN: [id number] Date of Birth: Jun 29, 1966  Transition of [facility]) CM/SW Contact:  Zenon Mayo, RN Phone Number: 05/26/2019, 3:46 PM   Clinical Narrative:    Patient was ready to go, NCM saw that he has no insurance for meds, assisted him with a Match letter, in which he had used before, had to get override for him.  He said he did not need anything but took the Match letter, NCM informed him to give to pharmacy to get meds for 3.00 each.     Final next level of care: Homeless Shelter Barriers to Discharge: Homeless with medical needs, Inadequate or no insurance   Patient Goals and CMS Choice Patient states their goals for this hospitalization and ongoing recovery are:: was ready to go   Choice offered to / list presented to : NA  Discharge Placement                       Discharge Plan and Services                DME Arranged: (NA)         HH Arranged: NA          Social Determinants of Health (SDOH) Interventions     Readmission Risk Interventions Readmission Risk Prevention Plan 04/06/2019 03/29/2019  Transportation Screening Complete Complete  PCP or Specialist Appt within 3-5 Days Complete Patient refused  [place] or Home Care Consult Complete -  Social Work Consult for [place] Planning/Counseling Complete Complete  Palliative Care Screening Not Applicable Not Applicable  Medication Review Press photographer) Complete Complete  Some recent data might be hidden

## 2019-05-26 NOTE — Progress Notes (Signed)
   Patient is refusing most of his care.  His creat is elevated.  I would suggest backing off on his diuretic to 80 mg daily.   Unfortunately, we cannot do much for him unless he commit to follow up and med compliance.  I would avoid ACE inhibitors.  He has cardiology care apparently elsewhere and I would ask the primary team to investigate this.  If he has no cardiology follow up we can arrange follow up in our clinic and try med titration if he will follow up with those appts.

## 2019-05-26 NOTE — Consult Note (Signed)
  Patient is seen and examined. Patient is a 52 YO male with a PPHx significant for bipolar disorder, cocaine dependence. Patient with significantly prolonged QTc and placed back on meds he received at Previous hospitalization in [place]. He refused these yesterday. I believe that his expression of suicidal ideation is for secondary gain and housing. I would recommend refers to outpatient substance abuse treatment. I would continue to offer his currently written medications for psych issues.  1) Bipolar/polysubstance abuse/Secondary gain - referral at discharge to outpatient substance abuse treatment.

## 2019-05-26 NOTE — Clinical Social Work Note (Signed)
Pt declined any SA or homelessness resources.  [place], [place]

## 2019-05-26 NOTE — Progress Notes (Signed)
Pharmacist Heart Failure Core Measure Documentation  Assessment: [name] has an EF documented as 15% on 03/28/19 by Echo.  Rationale: Heart failure patients with left ventricular systolic dysfunction (LVSD) and an EF < 40% should be prescribed an angiotensin converting enzyme inhibitor (ACEI) or angiotensin receptor blocker (ARB) at discharge unless a contraindication is documented in the medical record.  This patient is not currently on an ACEI or ARB for HF.  This note is being placed in the record in order to provide documentation that a contraindication to the use of these agents is present for this encounter.  ACE Inhibitor or Angiotensin Receptor Blocker is contraindicated (specify all that apply)  []   ACEI allergy AND ARB allergy []   Angioedema []   Moderate or severe aortic stenosis []   Hyperkalemia []   Hypotension []   Renal artery stenosis [x]   Worsening renal function, preexisting renal disease or dysfunction  Hildred Laser, PharmD Clinical Pharmacist **Pharmacist phone directory can now be found on [email or web address] ([email or web address]).  Listed under [place].

## 2019-06-21 ENCOUNTER — Emergency Department (HOSPITAL_COMMUNITY): Payer: Medicaid Other

## 2019-06-21 ENCOUNTER — Observation Stay (HOSPITAL_COMMUNITY)
Admission: EM | Admit: 2019-06-21 | Discharge: 2019-06-22 | Disposition: A | Discharge status: Home / Self Care | Payer: Medicaid Other | Attending: Family Medicine | Admitting: Family Medicine

## 2019-06-21 ENCOUNTER — Encounter (HOSPITAL_COMMUNITY): Payer: Self-pay | Admitting: Emergency Medicine

## 2019-06-21 ENCOUNTER — Other Ambulatory Visit: Payer: Self-pay

## 2019-06-21 DIAGNOSIS — Z91040 Latex allergy status: Secondary | ICD-10-CM | POA: Diagnosis not present

## 2019-06-21 DIAGNOSIS — N183 Chronic kidney disease, stage 3 (moderate): Secondary | ICD-10-CM | POA: Insufficient documentation

## 2019-06-21 DIAGNOSIS — I5022 Chronic systolic (congestive) heart failure: Secondary | ICD-10-CM | POA: Insufficient documentation

## 2019-06-21 DIAGNOSIS — I509 Heart failure, unspecified: Secondary | ICD-10-CM

## 2019-06-21 DIAGNOSIS — R079 Chest pain, unspecified: Secondary | ICD-10-CM

## 2019-06-21 DIAGNOSIS — R0602 Shortness of breath: Secondary | ICD-10-CM | POA: Diagnosis present

## 2019-06-21 DIAGNOSIS — Z79899 Other long term (current) drug therapy: Secondary | ICD-10-CM | POA: Insufficient documentation

## 2019-06-21 DIAGNOSIS — F1414 Cocaine abuse with cocaine-induced mood disorder: Secondary | ICD-10-CM | POA: Insufficient documentation

## 2019-06-21 DIAGNOSIS — J449 Chronic obstructive pulmonary disease, unspecified: Secondary | ICD-10-CM | POA: Diagnosis not present

## 2019-06-21 DIAGNOSIS — F1010 Alcohol abuse, uncomplicated: Secondary | ICD-10-CM | POA: Diagnosis present

## 2019-06-21 DIAGNOSIS — F250 Schizoaffective disorder, bipolar type: Secondary | ICD-10-CM | POA: Diagnosis not present

## 2019-06-21 DIAGNOSIS — Z886 Allergy status to analgesic agent status: Secondary | ICD-10-CM | POA: Diagnosis not present

## 2019-06-21 DIAGNOSIS — I130 Hypertensive heart and chronic kidney disease with heart failure and stage 1 through stage 4 chronic kidney disease, or unspecified chronic kidney disease: Secondary | ICD-10-CM | POA: Diagnosis not present

## 2019-06-21 DIAGNOSIS — F1994 Other psychoactive substance use, unspecified with psychoactive substance-induced mood disorder: Secondary | ICD-10-CM | POA: Diagnosis present

## 2019-06-21 DIAGNOSIS — K029 Dental caries, unspecified: Secondary | ICD-10-CM | POA: Diagnosis not present

## 2019-06-21 DIAGNOSIS — K047 Periapical abscess without sinus: Secondary | ICD-10-CM | POA: Insufficient documentation

## 2019-06-21 DIAGNOSIS — E876 Hypokalemia: Secondary | ICD-10-CM | POA: Insufficient documentation

## 2019-06-21 DIAGNOSIS — I5023 Acute on chronic systolic (congestive) heart failure: Secondary | ICD-10-CM | POA: Diagnosis present

## 2019-06-21 DIAGNOSIS — Z1159 Encounter for screening for other viral diseases: Secondary | ICD-10-CM | POA: Insufficient documentation

## 2019-06-21 DIAGNOSIS — F17210 Nicotine dependence, cigarettes, uncomplicated: Secondary | ICD-10-CM | POA: Diagnosis not present

## 2019-06-21 DIAGNOSIS — J441 Chronic obstructive pulmonary disease with (acute) exacerbation: Secondary | ICD-10-CM

## 2019-06-21 DIAGNOSIS — Z590 Homelessness: Secondary | ICD-10-CM | POA: Diagnosis not present

## 2019-06-21 DIAGNOSIS — F1990 Other psychoactive substance use, unspecified, uncomplicated: Secondary | ICD-10-CM | POA: Insufficient documentation

## 2019-06-21 LAB — BASIC METABOLIC PANEL
Anion gap: 9 (ref 5–15)
BUN: 17 mg/dL (ref 6–20)
CO2: 22 mmol/L (ref 22–32)
Calcium: 8.9 mg/dL (ref 8.9–10.3)
Chloride: 107 mmol/L (ref 98–111)
Creatinine, Ser: 1.43 mg/dL — ABNORMAL HIGH (ref 0.61–1.24)
GFR calc Af Amer: >60 mL/min (ref >60)
GFR calc non Af Amer: 56 mL/min — ABNORMAL LOW (ref >60)
Glucose, Bld: 110 mg/dL — ABNORMAL HIGH (ref 70–99)
Potassium: 3.3 mmol/L — ABNORMAL LOW (ref 3.5–5.1)
Sodium: 138 mmol/L (ref 135–145)

## 2019-06-21 LAB — CBC WITH DIFFERENTIAL/PLATELET
Abs Immature Granulocytes: 0.02 10*3/uL (ref 0.00–0.07)
Basophils Absolute: 0.1 10*3/uL (ref 0.0–0.1)
Basophils Relative: 1 %
Eosinophils Absolute: 0.1 10*3/uL (ref 0.0–0.5)
Eosinophils Relative: 1 %
HCT: 41.3 % (ref 39.0–52.0)
Hemoglobin: 13.7 g/dL (ref 13.0–17.0)
Immature Granulocytes: 0 %
Lymphocytes Relative: 36 %
Lymphs Abs: 3 10*3/uL (ref 0.7–4.0)
MCH: 32.4 pg (ref 26.0–34.0)
MCHC: 33.2 g/dL (ref 30.0–36.0)
MCV: 97.6 fL (ref 80.0–100.0)
Monocytes Absolute: 0.7 10*3/uL (ref 0.1–1.0)
Monocytes Relative: 8 %
Neutro Abs: 4.6 10*3/uL (ref 1.7–7.7)
Neutrophils Relative %: 54 %
Platelets: 226 10*3/uL (ref 150–400)
RBC: 4.23 MIL/uL (ref 4.22–5.81)
RDW: 15.6 % — ABNORMAL HIGH (ref 11.5–15.5)
WBC: 8.4 10*3/uL (ref 4.0–10.5)
nRBC: 0 % (ref 0.0–0.2)

## 2019-06-21 LAB — RAPID URINE DRUG SCREEN, HOSP PERFORMED
Amphetamines: NOT DETECTED
Barbiturates: NOT DETECTED
Benzodiazepines: NOT DETECTED
Cocaine: POSITIVE — AB
Opiates: NOT DETECTED
Tetrahydrocannabinol: NOT DETECTED

## 2019-06-21 LAB — SARS CORONAVIRUS 2 BY RT PCR (HOSPITAL ORDER, PERFORMED IN ~~LOC~~ HOSPITAL LAB): SARS Coronavirus 2: NEGATIVE

## 2019-06-21 LAB — TROPONIN I (HIGH SENSITIVITY): Troponin I (High Sensitivity): 30 ng/L — ABNORMAL HIGH (ref <18)

## 2019-06-21 LAB — BRAIN NATRIURETIC PEPTIDE: B Natriuretic Peptide: 1120.3 pg/mL — ABNORMAL HIGH (ref 0.0–100.0)

## 2019-06-21 IMAGING — CT CT ANGIOGRAPHY CHEST
1 of 6 series · 4 of 16 positions shown · IV contrast (omnipaque)
Comparison: CT chest [DATE]. Single-view of the chest
[DATE]

CLINICAL DATA: Dyspnea of unknown duration.

EXAM:
CT ANGIOGRAPHY CHEST WITH CONTRAST
TECHNIQUE: Multidetector CT imaging of the chest was performed using the
standard protocol during bolus administration of intravenous
contrast. Multiplanar CT image reconstructions and MIPs were
obtained to evaluate the vascular anatomy.
CONTRAST:  75 mL OMNIPAQUE IOHEXOL 350 MG/ML SOLN

[Series 7: pe thins · axial · 0.75mm/px · z∈[+1084,+1292]mm · 4 of 496 slices shown]
[im 100/496  lung]
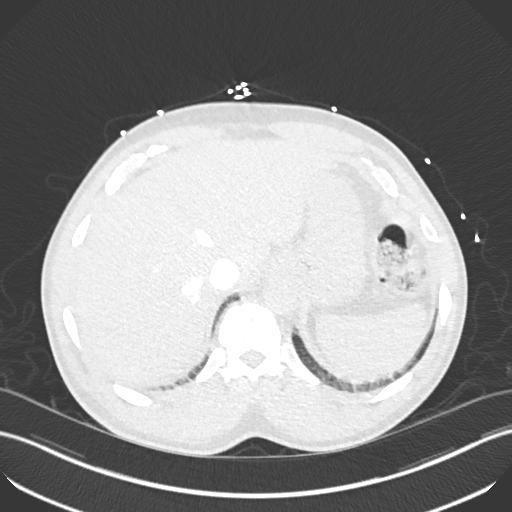
[im 199/496  soft-tissue]
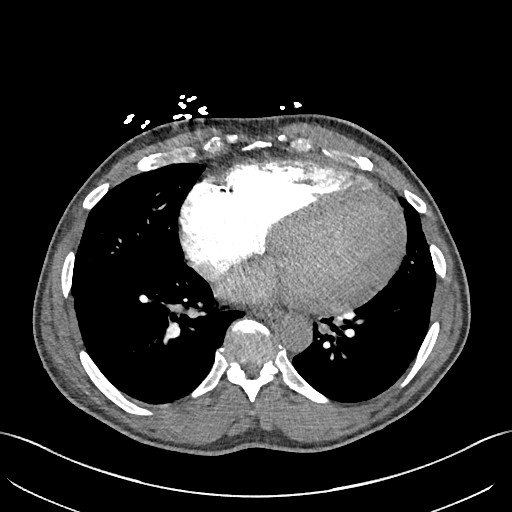
[im 298/496  lung]
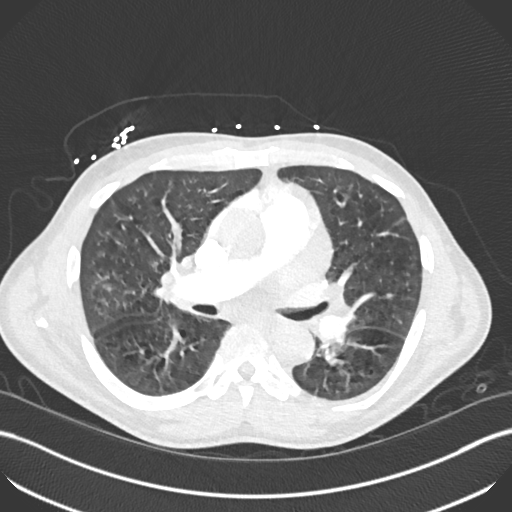
[im 397/496  soft-tissue]
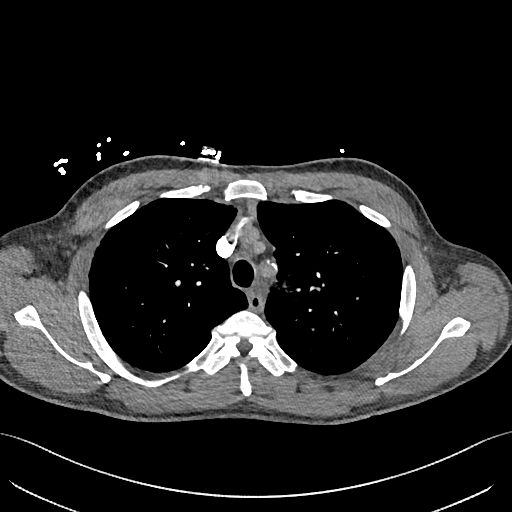

[4 of 16 positions shown; findings below may reference images not displayed]

FINDINGS: Cardiovascular: No pulmonary embolus is identified. Calcific aortic
and coronary atherosclerosis. Marked cardiomegaly. No pericardial
effusion.

Mediastinum/Nodes: No enlarged mediastinal, hilar, or axillary lymph
nodes. Thyroid gland, trachea, and esophagus demonstrate no
significant findings.

Lungs/Pleura: The lungs are emphysematous. There is a small focus of
airspace opacity in the medial aspect of the left upper lobe. Lungs
otherwise clear.

Upper Abdomen: No acute abnormality. Contrast refluxes into the
inferior vena cava and hepatic veins compatible with right heart
insufficiency.

Musculoskeletal: No acute or focal bony abnormality.

Review of the MIP images confirms the above findings.
IMPRESSION: Negative for pulmonary embolus.

Small focus of airspace opacity in the medial aspect of the left
upper lobe has an appearance most worrisome for pneumonia.

Cardiomegaly and findings compatible with right heart insufficiency.

Aortic and coronary artery atherosclerosis ([52]-[52]) and
emphysema ([52]-[52]).

## 2019-06-21 IMAGING — DX PORTABLE CHEST - 1 VIEW
1 series · 1 of 1 positions shown · non-contrast
Comparison: [DATE]

CLINICAL DATA: Shortness of breath

EXAM:
PORTABLE CHEST 1 VIEW

[chest ap]
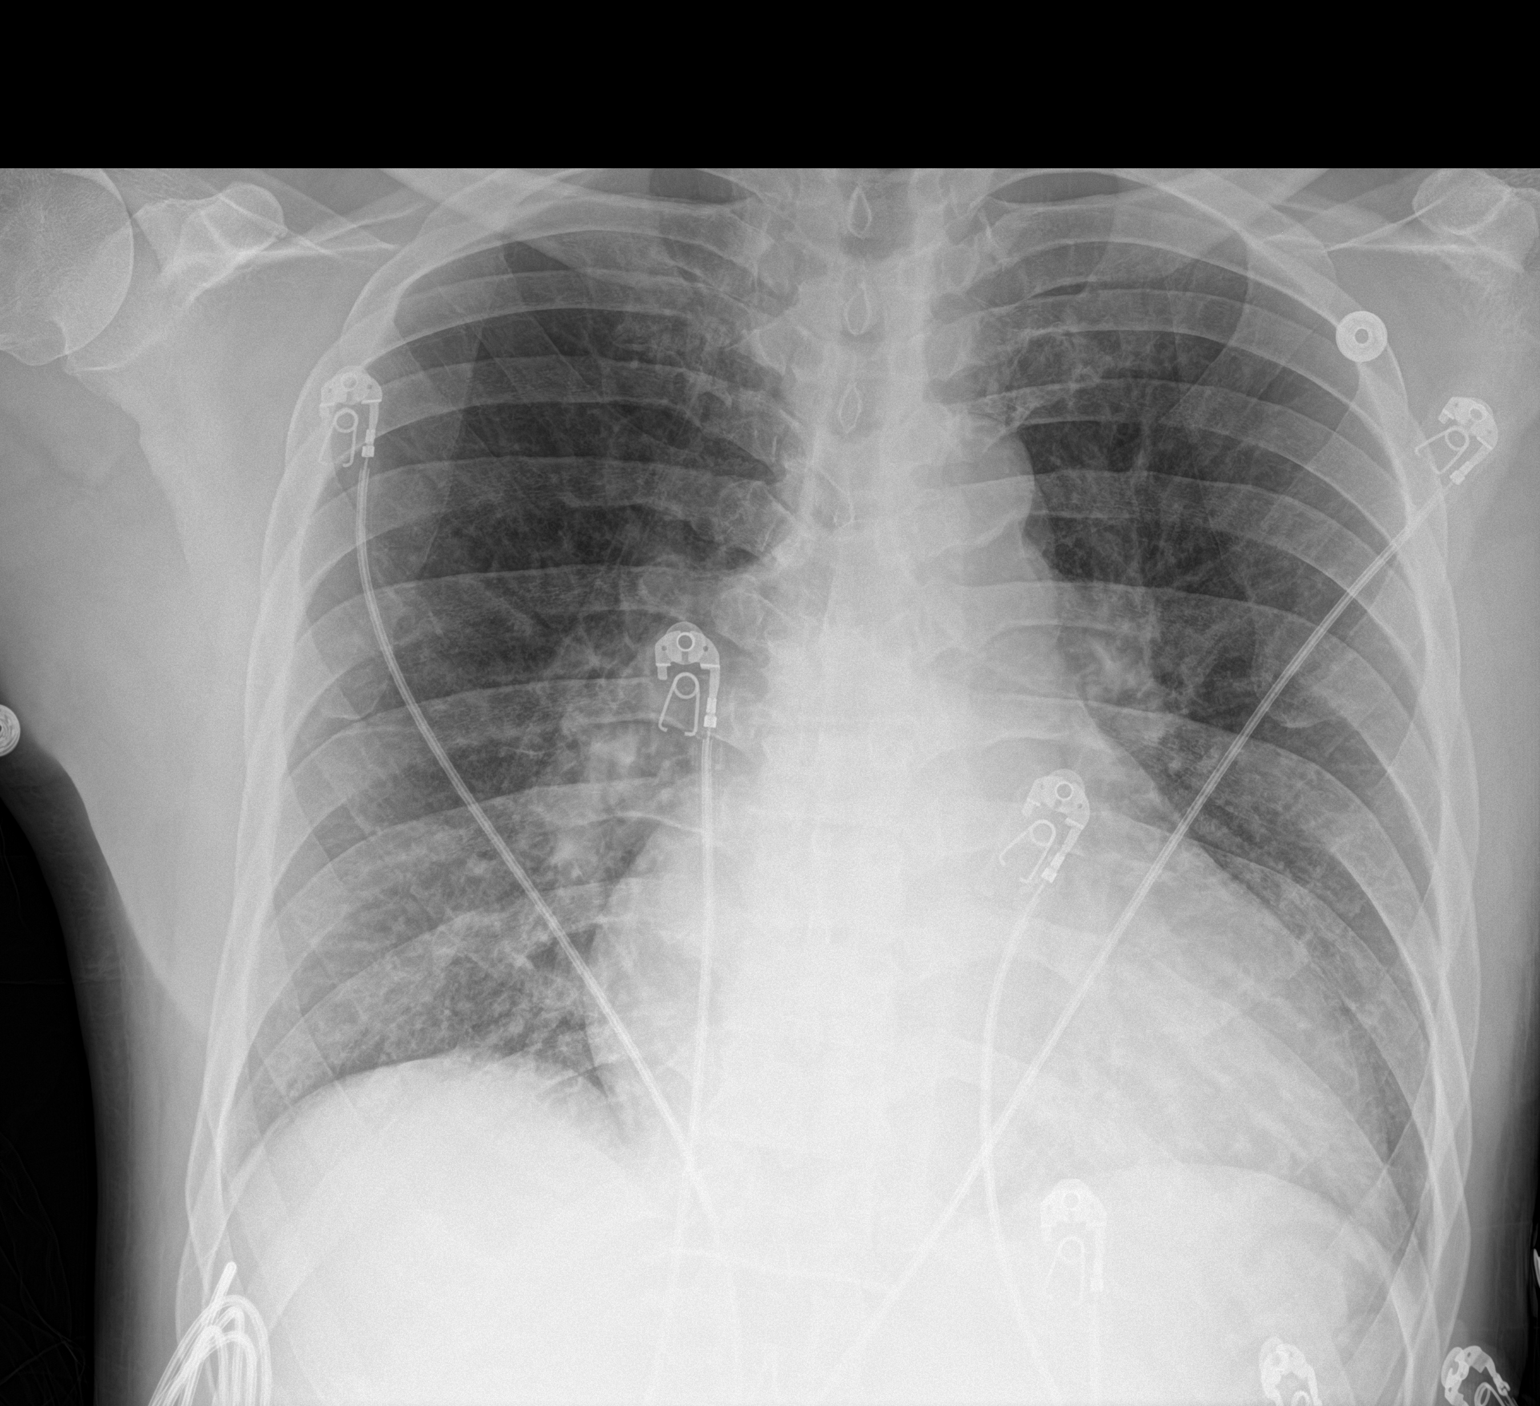

[1 of 1 positions shown; findings below may reference images not displayed]

FINDINGS: Cardiomegaly with stable mediastinal contours. There is no edema,
consolidation, effusion, or pneumothorax.
IMPRESSION: Cardiomegaly without pulmonary edema.

## 2019-06-21 MED ORDER — ALBUTEROL SULFATE HFA 108 (90 BASE) MCG/ACT IN AERS: 1.0000 | INHALATION_SPRAY | RESPIRATORY_TRACT | Status: DC | PRN

## 2019-06-21 MED ORDER — POTASSIUM CHLORIDE CRYS ER 20 MEQ PO TBCR
40.0000 meq | EXTENDED_RELEASE_TABLET | Freq: Once | ORAL | Status: AC
  Administered 2019-06-21: 40 meq via ORAL
  Filled 2019-06-21: qty 2

## 2019-06-21 MED ORDER — FUROSEMIDE 10 MG/ML IJ SOLN
40.0000 mg | Freq: Once | INTRAMUSCULAR | Status: AC
  Administered 2019-06-21: 40 mg via INTRAVENOUS
  Filled 2019-06-21: qty 4

## 2019-06-21 MED ORDER — ENOXAPARIN SODIUM 40 MG/0.4ML ~~LOC~~ SOLN
40.0000 mg | SUBCUTANEOUS | Status: DC
  Filled 2019-06-21: qty 0.4

## 2019-06-21 MED ORDER — FUROSEMIDE 20 MG PO TABS: 80.0000 mg | ORAL_TABLET | Freq: Once | ORAL | Status: DC

## 2019-06-21 MED ORDER — THIAMINE HCL 100 MG PO TABS: 100.0000 mg | ORAL_TABLET | Freq: Every day | ORAL | Status: DC

## 2019-06-21 MED ORDER — SODIUM CHLORIDE 0.9% FLUSH: 3.0000 mL | INTRAVENOUS | Status: DC | PRN

## 2019-06-21 MED ORDER — AMLODIPINE BESYLATE 10 MG PO TABS
10.0000 mg | ORAL_TABLET | Freq: Every day | ORAL | Status: DC
  Administered 2019-06-21 – 2019-06-22 (×2): 10 mg via ORAL
  Filled 2019-06-21 (×2): qty 1

## 2019-06-21 MED ORDER — ONDANSETRON HCL 4 MG/2ML IJ SOLN: 4.0000 mg | Freq: Four times a day (QID) | INTRAMUSCULAR | Status: DC | PRN

## 2019-06-21 MED ORDER — IOHEXOL 350 MG/ML SOLN
75.0000 mL | Freq: Once | INTRAVENOUS | Status: AC | PRN
  Administered 2019-06-21: 75 mL via INTRAVENOUS

## 2019-06-21 MED ORDER — SODIUM CHLORIDE 0.9 % IV SOLN: 250.0000 mL | INTRAVENOUS | Status: DC | PRN

## 2019-06-21 MED ORDER — ALBUTEROL SULFATE HFA 108 (90 BASE) MCG/ACT IN AERS: 2.0000 | INHALATION_SPRAY | Freq: Four times a day (QID) | RESPIRATORY_TRACT | Status: DC | PRN

## 2019-06-21 MED ORDER — TORSEMIDE 20 MG PO TABS
40.0000 mg | ORAL_TABLET | Freq: Every day | ORAL | Status: DC
  Administered 2019-06-21 – 2019-06-22 (×2): 40 mg via ORAL
  Filled 2019-06-21 (×2): qty 2

## 2019-06-21 MED ORDER — FOLIC ACID 1 MG PO TABS: 1.0000 mg | ORAL_TABLET | Freq: Every day | ORAL | Status: DC

## 2019-06-21 MED ORDER — SODIUM CHLORIDE 0.9% FLUSH
3.0000 mL | Freq: Two times a day (BID) | INTRAVENOUS | Status: DC
  Administered 2019-06-22: 3 mL via INTRAVENOUS

## 2019-06-21 MED ORDER — FUROSEMIDE 20 MG PO TABS: 80.0000 mg | ORAL_TABLET | Freq: Every day | ORAL | Status: DC

## 2019-06-21 MED ORDER — ALBUTEROL SULFATE (2.5 MG/3ML) 0.083% IN NEBU
2.5000 mg | INHALATION_SOLUTION | Freq: Four times a day (QID) | RESPIRATORY_TRACT | Status: DC | PRN
  Administered 2019-06-21: 2.5 mg via RESPIRATORY_TRACT
  Filled 2019-06-21: qty 3

## 2019-06-21 MED ORDER — ALBUTEROL SULFATE HFA 108 (90 BASE) MCG/ACT IN AERS
8.0000 | INHALATION_SPRAY | Freq: Once | RESPIRATORY_TRACT | Status: AC
  Administered 2019-06-21: 8 via RESPIRATORY_TRACT
  Filled 2019-06-21: qty 6.7

## 2019-06-21 NOTE — Progress Notes (Signed)
Pt gets agitated quickly, requested I wait to complete assessment.

## 2019-06-21 NOTE — ED Notes (Signed)
Pt given turkey sandwich and sprite.  

## 2019-06-21 NOTE — Plan of Care (Signed)

## 2019-06-21 NOTE — Progress Notes (Signed)
Pt refuses RN assessment. Pt states "MD just did an assessment." RN educated. Pt still refuses.

## 2019-06-21 NOTE — ED Notes (Signed)
Patient verbally abusive with staff , rude and argumentative with nurse and nurse tech .

## 2019-06-21 NOTE — ED Notes (Signed)
Pt very rude while trying to hook up to the monitor. Real bad temper from the moment I walked into room to assist.

## 2019-06-21 NOTE — Discharge Summary (Signed)
Family Medicine Teaching [facility] Discharge Summary  Patient name: [name] Medical record number: [id number] Date of birth: 12-Mar-1966 Age: 53 y.o. Gender: male Date of Admission: 06/21/2019  Date of Discharge: 06/22/2019 Admitting Physician: Revonda Standard, NP  Primary Care Provider: Medicine, [facility] Family Consultants: none  Indication for Hospitalization:Shortness of breath  Discharge Diagnoses/Problem List:  Shortness of breath, likely 2/2CHFexacerbation  Hypertension Schizoaffective disorder chronic Polysubstance use CKD 3 Disposition: Home  Discharge Condition: stable  Discharge Exam:  General: NAD, pleasant, able to participate in exam Cardiac: RRR, normal heart sounds, no murmurs. 2+ radial and PT pulses bilaterally Respiratory: CTAB, normal effort, No wheezes, rales or rhonchi Abdomen: soft, nontender, nondistended, no hepatic or splenomegaly, +BS Extremities: no edema or cyanosis.  Skin: warm and dry, no rashes noted Neuro: alert and oriented x4, no focal deficits Psych: Normal affect and mood  Brief Hospital Course:  [name] a 53 y.o.malepresenting with progressive shortness of breath. PMH is significant forHFrEF, schizoaffective disorder, HTN, polysubstance use, homelessness.  [name] presented to the Emergency Department with shortness of breath and chest pain for 7-10 days.  He had a productive cough with clear sputum and he had taken extra of jhis CHF medication without improvement.  He was not hypoxic on admission.  Chest xray shows cardiomegaly without pulmonary edema.  CTA negative for PE.  He spent a total of 1 night in hospital without significant events.  He was advised to follow up with his PCP in one week.   Issues for Follow Up:  1. Monitor for increasing shortness of breath 2. Monitor BP 3. Follow up in BMP for CKD 4. Counsel for polysubstance abuse 5. Follow up with psychiatry as needed  Significant  Procedures: none  Significant Labs and Imaging:  Recent Labs  Lab 06/21/19 0502 06/22/19 0439 06/23/19 1823  WBC 8.4 6.7 5.8  HGB 13.7 16.5 15.4  HCT 41.3 50.4 47.1  PLT 226 255 260   Recent Labs  Lab 06/21/19 0502 06/22/19 0439 06/23/19 1823  NA 138 139 139  K 3.3* 3.3* 3.7  CL 107 98 95*  CO2 22 29 32  GLUCOSE 110* 110* 115*  BUN 17 10 25*  CREATININE 1.43* 1.41* 1.50*  CALCIUM 8.9 9.1 9.5  ALKPHOS  --   --  88  AST  --   --  25  ALT  --   --  16  ALBUMIN  --   --  3.6      Results/Tests Pending at Time of Discharge: none  Discharge Medications:  Allergies as of 06/22/2019      Reactions   Acetaminophen Hives, Nausea And Vomiting, Rash   Pt overdosed on bottle of APAP 05/06/16   Latex Rash      Medication List    STOP taking these medications   amLODipine 10 MG tablet Commonly known as: NORVASC   divalproex 250 MG DR tablet Commonly known as: DEPAKOTE   furosemide 40 MG tablet Commonly known as: LASIX     TAKE these medications   albuterol 108 (90 Base) MCG/ACT inhaler Commonly known as: VENTOLIN HFA Inhale 1-2 puffs into the lungs every 4 (four) hours as needed for shortness of breath.   ARIPiprazole 2 MG tablet Commonly known as: ABILIFY Take 1 tablet (2 mg total) by mouth at bedtime.       Discharge Instructions: Please refer to Patient Instructions section of EMR for full details.  Patient was counseled important signs and symptoms that should  prompt return to medical care, changes in medications, dietary instructions, activity restrictions, and follow up appointments.   Follow-Up Appointments: Follow-up Information    Medicine, [facility].   Specialty: Family Medicine Why: Not a patient at [place] appt. made  Contact information: [place] [address] [phone]           Carollee Leitz, MD 06/25/2019, 4:11 PM PGY-1, [place]

## 2019-06-21 NOTE — ED Triage Notes (Signed)
Patient arrived with EMS from street ( homeless) reports SOB worse with exertion with occasional productive cough , denies fever or chills .

## 2019-06-21 NOTE — ED Provider Notes (Signed)
TIME SEEN: 5:02 AM  CHIEF COMPLAINT: Shortness of breath  HPI: Patient is a 53 year old male with history of CHF, bipolar disorder, schizoaffective disorder, hypertension, homelessness who presents emergency department with 7 to 10 days of shortness of breath, chest pain that he described as a tightness, cough with clear sputum production.  States he is taking his medicine for his CHF but states "they are not working".  He states that he knows he is having another CHF exacerbation and needs admission to the hospital.  He is admitted frequently for the same.  Does not wear oxygen chronically.  No fevers.  He states he takes Lasix 40 mg daily but has been doubling up on of the last 2 days without any relief.  He reports compliance with this medication.  ROS: See HPI Constitutional: no fever  Eyes: no drainage  ENT: no runny nose   Cardiovascular:   chest pain  Resp:  SOB  GI: no vomiting GU: no dysuria Integumentary: no rash  Allergy: no hives  Musculoskeletal: no leg swelling  Neurological: no slurred speech ROS otherwise negative  PAST MEDICAL HISTORY/PAST SURGICAL HISTORY:  Past Medical History:  Diagnosis Date  . Asthma   . Bipolar 1 disorder (HCC)   . CHF (congestive heart failure) (HCC)   . Depression   . Homelessness   . Hypertension   . QT prolongation   . Schizoaffective disorder (HCC)     MEDICATIONS:  Prior to Admission medications   Medication Sig Start Date End Date Taking? Authorizing Provider  albuterol (PROVENTIL HFA;VENTOLIN HFA) 108 (90 Base) MCG/ACT inhaler Inhale 1-2 puffs into the lungs every 4 (four) hours as needed for shortness of breath. Patient not taking: Reported on 05/24/2019 08/22/18   Dione Booze, MD  amLODipine (NORVASC) 10 MG tablet Take 1 tablet (10 mg total) by mouth daily. Patient not taking: Reported on 05/12/2019 04/06/19   Ali Lowe, MD  amoxicillin-clavulanate (AUGMENTIN) 875-125 MG tablet Take 1 tablet by mouth every 12 (twelve) hours.  05/26/19   Mirian Mo, MD  ARIPiprazole (ABILIFY) 2 MG tablet Take 1 tablet (2 mg total) by mouth at bedtime. 05/26/19   Mirian Mo, MD  divalproex (DEPAKOTE) 250 MG DR tablet Take 1 tablet (250 mg total) by mouth every 12 (twelve) hours. 05/26/19   Mirian Mo, MD  folic acid (FOLVITE) 1 MG tablet Take 1 tablet (1 mg total) by mouth daily. 05/27/19   Mirian Mo, MD  furosemide (LASIX) 40 MG tablet Take 1 tablet (40 mg total) by mouth 2 (two) times daily for 30 days. Patient not taking: Reported on 05/24/2019 04/19/19 05/19/19  Theotis Barrio, MD  furosemide (LASIX) 40 MG tablet Take 1 tablet (40 mg total) by mouth 2 (two) times daily. 05/12/19   Cathren Laine, MD  ipratropium (ATROVENT) 0.02 % nebulizer solution Take 2.5 mLs (0.5 mg total) by nebulization every 6 (six) hours as needed for wheezing or shortness of breath. Patient not taking: Reported on 05/12/2019 03/09/19   Claudean Severance, MD  thiamine 100 MG tablet Take 1 tablet (100 mg total) by mouth daily. 05/27/19   Mirian Mo, MD    ALLERGIES:  Allergies  Allergen Reactions  . Acetaminophen Hives, Nausea And Vomiting and Rash    Pt overdosed on bottle of APAP 05/06/16   . Latex Rash    SOCIAL HISTORY:  Social History   Tobacco Use  . Smoking status: Current Every Day Smoker    Years: 30.00    Types:  Cigarettes  . Smokeless tobacco: Never Used  Substance Use Topics  . Alcohol use: Yes    Comment: infrequently    FAMILY HISTORY: Family History  Problem Relation Age of Onset  . Drug abuse Mother   . Suicidality Mother     EXAM: BP (!) 149/103 (BP Location: Left Arm)   Pulse 90   Temp 98.8 F (37.1 C) (Oral)   Resp 20   SpO2 100%  CONSTITUTIONAL: Alert and oriented and responds appropriately to questions.  Chronically ill-appearing, very agitated HEAD: Normocephalic EYES: Conjunctivae clear, pupils appear equal, EOMI ENT: normal nose; moist mucous membranes NECK: Supple, no meningismus, no nuchal rigidity, no LAD   CARD: RRR; S1 and S2 appreciated; no murmurs, no clicks, no rubs, no gallops RESP: Patient is tachypneic and speaking short sentences.  He is not in significant distress.  He does have some bibasilar rales.  No rhonchi or wheezing.  No hypoxia. ABD/GI: Normal bowel sounds; non-distended; soft, non-tender, no rebound, no guarding, no peritoneal signs, no hepatosplenomegaly BACK:  The back appears normal and is non-tender to palpation, there is no CVA tenderness EXT: Normal ROM in all joints; non-tender to palpation; no edema; normal capillary refill; no cyanosis, no calf tenderness or swelling    SKIN: Normal color for age and race; warm; no rash NEURO: Moves all extremities equally PSYCH: Agitated, rude  MEDICAL DECISION MAKING: Patient here with likely CHF exacerbation.  He appears short of breath on exam but is not hypoxic.  Will obtain cardiac labs, chest x-ray.  EKG shows no new ischemic abnormality.  Anticipate admission due to increased work of breathing likely from CHF.  Differential also includes PE, ACS, pneumonia, COVID, pneumothorax.  ED PROGRESS: Patient's chest x-ray shows cardiomegaly without overt edema however patient does look like he has increased work of breathing and is tachypneic here speaking short sentences.  Will obtain CT angios to rule out PE and to evaluate for other causes of shortness of breath.  He did have a negative CTA of his chest on 05/12/19.  He does have risk factors for PE including multiple recent admissions to the hospital.    5:55 AM  Pt is increasingly agitated and refusing to allow nurse to place IV for CTA.  I went back into talk to the patient and explained to him that I am concerned about his increased work of breathing.  He is convinced that this is pulmonary edema.  We have discussed that he has risk factors for PE and I have recommended the CT scan.  I have been able to get patient to calm down and he agrees to allow another nurse to place an IV.  He  does have a history of COPD as well that he denies this.  He agrees to try albuterol inhaler to see if this improves his symptoms.  7:06 AM Discussed patient's case with FM resident.  I have recommended admission and patient (and family if present) agree with this plan. Admitting physician will place admission orders.   Patient has received 40 mg of IV Lasix.  His carbon swab is negative.  CTA chest pending.  Family medicine resident aware and will follow up on results.  I reviewed all nursing notes, vitals, pertinent previous records, EKGs, lab and urine results, imaging (as available).    EKG Interpretation  Date/Time:  Tuesday June 21 2019 05:03:45 EDT Ventricular Rate:  88 PR Interval:    QRS Duration: 99 QT Interval:  435 QTC Calculation: 527  R Axis:   47 Text Interpretation:  Sinus rhythm Biatrial enlargement LVH with secondary repolarization abnormality Prolonged QT interval No significant change since last tracing Confirmed by Ward, Baxter HireKristen [phone]([id number]) on 06/21/2019 5:05:36 AM         Ward, Layla MawKristen N, DO 06/21/19 [id number]

## 2019-06-21 NOTE — ED Notes (Signed)
Pt refusing further blood draws, admitting provider at bed side.

## 2019-06-21 NOTE — ED Notes (Signed)
Patient refused IV access for CT angio , EDP notified.

## 2019-06-21 NOTE — H&P (Addendum)
[facility] Admission History and Physical Service Pager: [phone]  Patient name: [name] Medical record number: [id number] Date of birth: 1966-02-24 Age: 53 y.o. Gender: male  Primary Care Provider: Medicine, [facility] Family Consultants: None Code Status: Full, needs confirmation once compliant with questions Preferred Emergency Contact: Unknown  Chief Complaint: shortness of breath  Assessment and Plan: [name] is a 53 y.o. male presenting with progressive shortness of breath. PMH is significant for HFrEF, schizoaffective disorder, HTN, polysubstance use, homelessness  Shortness of breath, likely 2/2 CHF exacerbation Endorses 10-day progressive history of shortness of breath with exertion as well as lying flat.  Reports he has increased his home Lasix dose to 80 mg without improvement.  On admission was not hypoxic but was provided oxygen for comfort due to tachypnea.  Was able to wean to room air on admission without tachypnea or hypoxia.  Received 1 dose of IV Lasix 40 mg in the ED with good response, no urine output documented yet.  Unknown dry weight, last weight documented 81 kg.  Has not been weighed yet this admission.  Most recent echo 03/2019 with LVEF <15% and diffuse hypokinesis.  Has had multiple admissions in the past for similar presentation and has been referred to cardiology outpatient though per chart review patient has refused care and is noncompliant with medication regimen.  On exam did have fine bibasilar crackles though with comfortable work of breathing on room air without hypoxia and no lower extremity edema.  CXR without pulmonary congestion.  CTA negative for PE, did show very small airspace opacity in the left upper lobe but unlikely to have pneumonia given lack of fever, no leukocytosis or change in cough, no current need for antibiotics.  Does have COPD per chart review although patient denies history of this.  Is managed  without controller inhaler, uses albuterol as needed but notes this has not helped.  Given albuterol has not helped his symptoms and without change in cough, likely not COPD exacerbation.  No chest pain and troponin same as before. Reports symptoms are better after receiving IV Lasix in the ED, can likely transition to Lasix p.o. later today given lack of overwhelming fluid overload on exam and would recommend cardiology follow-up outpatient as previously discussed.  -Admit to obs, attending Dr. Erin Hearing. - s/p IV Lasix 40mg  - continue Lasix 80mg  PO daily with additional dose today. -Monitor BMP for creatinine and potassium abnormalities. -Daily weights -Strict I&Os -No current need for repeat echo -Can consider palliative consult given end-stage chronic disease, however has not been amenable to this in the past. -Avoid beta-blockers given cocaine use   Hypertension Hypertensive on admission 299B systolic.  Amlodipine 10 mg per chart review.  Patient reports compliance to me with a few missed doses however with pharmacy tech stated he is not taking. -Continue amlodipine  Schizoaffective disorder With previous SI, however not endorsing at this time.  Psychiatry consulted during previous admission and started Abilify and Depakote however patient refused this.  Per chart review was started on Seroquel at recent admission 7/3 at [facility] although during med rec stated he is not taking this. Gold card was provided for outpatient psych follow-up.  Also referred to outpatient substance abuse treatment at previous [facility] admission.   -Hold Seroquel given unknown compliance -Monitor for SI  Polysubstance use Endorses frequent alcohol and cocaine use.  Reports last drink and last cocaine use was a few days ago.  Unable to quantify alcohol use.  UDS positive for cocaine.  Was referred to outpatient substance abuse treatment program at previous admission. -CIWA -EtOH level -Social work consult  CKD  3 Creatinine 1.43 on admission, seems at baseline. -Monitor with BMP -Avoid nephrotoxic agents  Hypokalemia K 3.3 on admission.  Provided 40 mEq K-Dur. -Monitor with BMP  Dental abscess Did not finish previous antibiotic regimen, missed the last few days per patient.  Was scheduled to finish Augmentin 05/31/2019.  Dental caries again seen on exam though without localized swelling or redness. -No current need for antibiotic -Recommend dental referral outpatient  Homelessness Noted per chart review.  Patient was not forthcoming about home situation. -Social work consult  FEN/GI: Heart healthy Prophylaxis: Lovenox  Disposition: MedSurg, attending Dr. Deirdre Priesthambliss.  Can likely be discharged later today or tomorrow pending fluid status and social work recommendations.  History of Present Illness:  [name] is a 53 y.o. male presenting with progressive shortness of breath for the past 10 days exertion and with lying flat.  He states he has been taking his Lasix as prescribed and for the past couple days has doubled his dose to 80 mg daily.  At last admission he was referred to cardiology outpatient, however has not been established with them yet per patient.  Reports he normally carries fluid in his lungs and rarely has edema in his lower extremities or abdomen.  He denies history of COPD though states he has been told he has it.  He does not take any controller inhaler for this.  He does report he has been using his albuterol this week, he is not sure how much but reports it is not helping.  He does report coughing up clear mucus.  Denies fevers.  He reports compliance to his medication but does report missing a few doses the last few days.  Reports his dental abscess is better although he did not finish his antibiotics and missed the last few days, does not give a reason for this.  Denies any SI/HI.  Reports last drink of alcohol and last cocaine use was a couple days ago.  He is a 1 pack/day  smoker for many years.  Review Of Systems: Per HPI with the following additions:   Review of Systems  Constitutional: Negative for chills and fever.  HENT: Negative for sore throat.   Respiratory: Positive for cough and shortness of breath.   Cardiovascular: Negative for chest pain.  Gastrointestinal: Negative for constipation and diarrhea.  Genitourinary: Negative for dysuria.    Patient Active Problem List   Diagnosis Date Noted  . QT prolongation 05/25/2019  . Cocaine abuse (HCC)   . Chest pain 05/24/2019  . Elevated troponin   . Homelessness   . Suicidal ideations   . Acute on chronic systolic heart failure (HCC) 04/18/2019  . Bilateral foot pain 04/05/2019  . Acute on chronic clinical systolic heart failure (HCC) 04/04/2019  . Housing situation unstable 03/28/2019  . AKI (acute kidney injury) (HCC)   . Bilateral lower extremity edema   . Acute on chronic HFrEF (heart failure with reduced ejection fraction) (HCC) 03/27/2019  . COPD exacerbation (HCC)   . Acute exacerbation of CHF (congestive heart failure) (HCC) 03/04/2019  . Asthma 12/26/2016  . Tobacco use disorder 12/26/2016  . Schizophrenia (HCC) 12/26/2016  . Cocaine abuse with cocaine-induced mood disorder (HCC) 09/06/2016  . Alcohol use disorder, mild, abuse 07/31/2016  . Substance induced mood disorder (HCC) 07/30/2016  . Schizoaffective disorder, bipolar type (HCC) 05/31/2016  .  Cocaine use disorder, severe, dependence (HCC) 05/31/2016  . Essential hypertension 05/31/2016    Past Medical History: Past Medical History:  Diagnosis Date  . Asthma   . Bipolar 1 disorder (HCC)   . CHF (congestive heart failure) (HCC)   . Depression   . Homelessness   . Hypertension   . QT prolongation   . Schizoaffective disorder [facility](HCC)     Past Surgical History: History reviewed. No pertinent surgical history.  Social History: Social History   Tobacco Use  . Smoking status: Current Every Day Smoker    Years: 30.00     Types: Cigarettes  . Smokeless tobacco: Never Used  Substance Use Topics  . Alcohol use: Yes    Comment: infrequently  . Drug use: Yes    Types: Cocaine, Marijuana   Additional social history: Per chart review, is homeless although would not cooperate with much of interview to elicit if he is still homeless. Please also refer to relevant sections of EMR.  Family History: Family History  Problem Relation Age of Onset  . Drug abuse Mother   . Suicidality Mother     Allergies and Medications: Allergies  Allergen Reactions  . Acetaminophen Hives, Nausea And Vomiting and Rash    Pt overdosed on bottle of APAP 05/06/16   . Latex Rash   No current facility-administered medications on file prior to encounter.    Current Outpatient Medications on File Prior to Encounter  Medication Sig Dispense Refill  . albuterol (PROVENTIL HFA;VENTOLIN HFA) 108 (90 Base) MCG/ACT inhaler Inhale 1-2 puffs into the lungs every 4 (four) hours as needed for shortness of breath. (Patient not taking: Reported on 05/24/2019) 1 Inhaler 1  . amLODipine (NORVASC) 10 MG tablet Take 1 tablet (10 mg total) by mouth daily. (Patient not taking: Reported on 05/12/2019) 30 tablet 0  . amoxicillin-clavulanate (AUGMENTIN) 875-125 MG tablet Take 1 tablet by mouth every 12 (twelve) hours. 10 tablet 0  . ARIPiprazole (ABILIFY) 2 MG tablet Take 1 tablet (2 mg total) by mouth at bedtime. 60 tablet 0  . divalproex (DEPAKOTE) 250 MG DR tablet Take 1 tablet (250 mg total) by mouth every 12 (twelve) hours. 60 tablet 0  . folic acid (FOLVITE) 1 MG tablet Take 1 tablet (1 mg total) by mouth daily. 30 tablet 0  . furosemide (LASIX) 40 MG tablet Take 1 tablet (40 mg total) by mouth 2 (two) times daily for 30 days. (Patient not taking: Reported on 05/24/2019) 60 tablet 0  . furosemide (LASIX) 40 MG tablet Take 1 tablet (40 mg total) by mouth 2 (two) times daily. 60 tablet 0  . ipratropium (ATROVENT) 0.02 % nebulizer solution Take 2.5  mLs (0.5 mg total) by nebulization every 6 (six) hours as needed for wheezing or shortness of breath. (Patient not taking: Reported on 05/12/2019) 75 mL 0  . thiamine 100 MG tablet Take 1 tablet (100 mg total) by mouth daily. 30 tablet 0    Objective: BP (!) 149/103 (BP Location: Left Arm)   Pulse 90   Temp 98.8 F (37.1 C) (Oral)   Resp 20   SpO2 100%  Exam: General: agitated comfortable appearing, lying in bed, in no apparent distress Eyes: PERRL, muddy sclera ENTM: Oropharynx clear without erythema or exudate.  Left bottom molar dental cavity seen without swelling. Neck: No appreciable JVD Cardiovascular: Regular rate and rhythm, no murmur.  No lower extremity edema. Respiratory: From door breathing comfortable on room air, however upon entering patient became tachypneic.  When distracted, had comfortable work of breathing on room air.  Saturations 98% on 3 L, weaned to room air in room and able to maintain sats.  Lungs clear to auscultation bilaterally with fine bibasilar crackles. Gastrointestinal: Did not let me examine MSK: Did not let me examine fully, good tone grossly Derm: No appreciable lesions or rash Neuro: Speech normal, no gross deficits Psych: Agitated, appropriately dressed.  Denies SI/HI.  Labs and Imaging: CBC BMET  Recent Labs  Lab 06/21/19 0502  WBC 8.4  HGB 13.7  HCT 41.3  PLT 226   Recent Labs  Lab 06/21/19 0502  NA 138  K 3.3*  CL 107  CO2 22  BUN 17  CREATININE 1.43*  GLUCOSE 110*  CALCIUM 8.9     EKG: LVH, unchanged from prior  Ct Angio Chest Pe W And/or Wo Contrast  Result Date: 06/21/2019 CLINICAL DATA:  Dyspnea of unknown duration. EXAM: CT ANGIOGRAPHY CHEST WITH CONTRAST TECHNIQUE: Multidetector CT imaging of the chest was performed using the standard protocol during bolus administration of intravenous contrast. Multiplanar CT image reconstructions and MIPs were obtained to evaluate the vascular anatomy. CONTRAST:  75 mL OMNIPAQUE  IOHEXOL 350 MG/ML SOLN COMPARISON:  CT chest 05/12/2019. Single-view of the chest 06/21/2019 FINDINGS: Cardiovascular: No pulmonary embolus is identified. Calcific aortic and coronary atherosclerosis. Marked cardiomegaly. No pericardial effusion. Mediastinum/Nodes: No enlarged mediastinal, hilar, or axillary lymph nodes. Thyroid gland, trachea, and esophagus demonstrate no significant findings. Lungs/Pleura: The lungs are emphysematous. There is a small focus of airspace opacity in the medial aspect of the left upper lobe. Lungs otherwise clear. Upper Abdomen: No acute abnormality. Contrast refluxes into the inferior vena cava and hepatic veins compatible with right heart insufficiency. Musculoskeletal: No acute or focal bony abnormality. Review of the MIP images confirms the above findings. IMPRESSION: Negative for pulmonary embolus. Small focus of airspace opacity in the medial aspect of the left upper lobe has an appearance most worrisome for pneumonia. Cardiomegaly and findings compatible with right heart insufficiency. Aortic and coronary artery atherosclerosis (ICD10-I70.0) and emphysema (ICD10-J43.9). Electronically Signed   By: Drusilla Kanner M.D.   On: 06/21/2019 08:09   Dg Chest Portable 1 View  Result Date: 06/21/2019 CLINICAL DATA:  Shortness of breath EXAM: PORTABLE CHEST 1 VIEW COMPARISON:  05/24/2011 FINDINGS: Cardiomegaly with stable mediastinal contours. There is no edema, consolidation, effusion, or pneumothorax. IMPRESSION: Cardiomegaly without pulmonary edema. Electronically Signed   By: Marnee Spring M.D.   On: 06/21/2019 05:25   Ellwood Dense, DO 06/21/2019, 7:05 AM PGY-3, [facility] FPTS Intern pager: [phone], text pages welcome

## 2019-06-21 NOTE — Progress Notes (Signed)
Pt refused labs.  

## 2019-06-21 NOTE — Progress Notes (Signed)
Patient refusing Vital signs.

## 2019-06-21 NOTE — ED Notes (Signed)
Pt refused blood draw for Troponin

## 2019-06-22 DIAGNOSIS — F1010 Alcohol abuse, uncomplicated: Secondary | ICD-10-CM | POA: Diagnosis not present

## 2019-06-22 DIAGNOSIS — R0602 Shortness of breath: Secondary | ICD-10-CM | POA: Diagnosis not present

## 2019-06-22 DIAGNOSIS — F1994 Other psychoactive substance use, unspecified with psychoactive substance-induced mood disorder: Secondary | ICD-10-CM

## 2019-06-22 DIAGNOSIS — J441 Chronic obstructive pulmonary disease with (acute) exacerbation: Secondary | ICD-10-CM

## 2019-06-22 DIAGNOSIS — I5023 Acute on chronic systolic (congestive) heart failure: Secondary | ICD-10-CM | POA: Diagnosis not present

## 2019-06-22 LAB — BASIC METABOLIC PANEL
Anion gap: 12 (ref 5–15)
BUN: 10 mg/dL (ref 6–20)
CO2: 29 mmol/L (ref 22–32)
Calcium: 9.1 mg/dL (ref 8.9–10.3)
Chloride: 98 mmol/L (ref 98–111)
Creatinine, Ser: 1.41 mg/dL — ABNORMAL HIGH (ref 0.61–1.24)
GFR calc Af Amer: >60 mL/min (ref >60)
GFR calc non Af Amer: 57 mL/min — ABNORMAL LOW (ref >60)
Glucose, Bld: 110 mg/dL — ABNORMAL HIGH (ref 70–99)
Potassium: 3.3 mmol/L — ABNORMAL LOW (ref 3.5–5.1)
Sodium: 139 mmol/L (ref 135–145)

## 2019-06-22 LAB — CBC WITH DIFFERENTIAL/PLATELET
Abs Immature Granulocytes: 0.01 10*3/uL (ref 0.00–0.07)
Basophils Absolute: 0 10*3/uL (ref 0.0–0.1)
Basophils Relative: 0 %
Eosinophils Absolute: 0.1 10*3/uL (ref 0.0–0.5)
Eosinophils Relative: 2 %
HCT: 50.4 % (ref 39.0–52.0)
Hemoglobin: 16.5 g/dL (ref 13.0–17.0)
Immature Granulocytes: 0 %
Lymphocytes Relative: 37 %
Lymphs Abs: 2.5 10*3/uL (ref 0.7–4.0)
MCH: 32.1 pg (ref 26.0–34.0)
MCHC: 32.7 g/dL (ref 30.0–36.0)
MCV: 98.1 fL (ref 80.0–100.0)
Monocytes Absolute: 0.7 10*3/uL (ref 0.1–1.0)
Monocytes Relative: 10 %
Neutro Abs: 3.4 10*3/uL (ref 1.7–7.7)
Neutrophils Relative %: 51 %
Platelets: 255 10*3/uL (ref 150–400)
RBC: 5.14 MIL/uL (ref 4.22–5.81)
RDW: 15.4 % (ref 11.5–15.5)
WBC: 6.7 10*3/uL (ref 4.0–10.5)
nRBC: 0 % (ref 0.0–0.2)

## 2019-06-22 MED ORDER — TORSEMIDE 20 MG PO TABS: 40.0000 mg | ORAL_TABLET | Freq: Every day | ORAL | 0 refills | Status: DC

## 2019-06-22 MED ORDER — ALBUTEROL SULFATE HFA 108 (90 BASE) MCG/ACT IN AERS: 2.0000 | INHALATION_SPRAY | RESPIRATORY_TRACT | Status: DC | PRN

## 2019-06-22 MED ORDER — ACETAMINOPHEN 325 MG PO TABS: 650.0000 mg | ORAL_TABLET | Freq: Four times a day (QID) | ORAL | Status: DC | PRN

## 2019-06-22 MED ORDER — BENZONATATE 100 MG PO CAPS: 100.0000 mg | ORAL_CAPSULE | Freq: Three times a day (TID) | ORAL | Status: DC | PRN

## 2019-06-22 MED ORDER — POTASSIUM CHLORIDE CRYS ER 20 MEQ PO TBCR
40.0000 meq | EXTENDED_RELEASE_TABLET | Freq: Once | ORAL | Status: AC
  Administered 2019-06-22: 40 meq via ORAL
  Filled 2019-06-22: qty 2

## 2019-06-22 MED ORDER — BENZONATATE 100 MG PO CAPS: 100.0000 mg | ORAL_CAPSULE | Freq: Three times a day (TID) | ORAL | 0 refills | Status: DC | PRN

## 2019-06-22 MED FILL — TORSEMIDE 20 MG TABLET: 20 | 60 days supply | Qty: 60 | Fill #0

## 2019-06-22 MED FILL — BENZONATATE 100 MG CAP: 100 | 7 days supply | Qty: 20 | Fill #0

## 2019-06-22 NOTE — Progress Notes (Signed)
Discharge instructions reviewed with the patient.  Patient voices understanding.  Patient given his medications from the transitions of care pharmacy.

## 2019-06-22 NOTE — Progress Notes (Signed)
Went to do my assessment.  Pt would not allow me to do a full assessment. - only was able

## 2019-06-22 NOTE — Discharge Instructions (Signed)
Follow up with PCP in one week If you experience any worsening shortness of breathing please seek medical attention

## 2019-06-23 ENCOUNTER — Other Ambulatory Visit: Payer: Self-pay

## 2019-06-23 ENCOUNTER — Other Ambulatory Visit: Payer: Self-pay | Admitting: Behavioral Health

## 2019-06-23 ENCOUNTER — Emergency Department (HOSPITAL_COMMUNITY)
Admission: EM | Admit: 2019-06-23 | Discharge: 2019-06-24 | Disposition: A | Discharge status: Other Acute Inpatient Hospital | Payer: Medicaid Other | Attending: Emergency Medicine | Admitting: Emergency Medicine

## 2019-06-23 DIAGNOSIS — Z1159 Encounter for screening for other viral diseases: Secondary | ICD-10-CM | POA: Diagnosis not present

## 2019-06-23 DIAGNOSIS — I5022 Chronic systolic (congestive) heart failure: Secondary | ICD-10-CM | POA: Diagnosis not present

## 2019-06-23 DIAGNOSIS — Z590 Homelessness: Secondary | ICD-10-CM | POA: Diagnosis not present

## 2019-06-23 DIAGNOSIS — J449 Chronic obstructive pulmonary disease, unspecified: Secondary | ICD-10-CM | POA: Insufficient documentation

## 2019-06-23 DIAGNOSIS — Z91040 Latex allergy status: Secondary | ICD-10-CM | POA: Insufficient documentation

## 2019-06-23 DIAGNOSIS — F602 Antisocial personality disorder: Secondary | ICD-10-CM | POA: Diagnosis not present

## 2019-06-23 DIAGNOSIS — Z20828 Contact with and (suspected) exposure to other viral communicable diseases: Secondary | ICD-10-CM | POA: Diagnosis not present

## 2019-06-23 DIAGNOSIS — I110 Hypertensive heart disease with heart failure: Secondary | ICD-10-CM | POA: Diagnosis not present

## 2019-06-23 DIAGNOSIS — J45909 Unspecified asthma, uncomplicated: Secondary | ICD-10-CM | POA: Insufficient documentation

## 2019-06-23 DIAGNOSIS — R45851 Suicidal ideations: Secondary | ICD-10-CM | POA: Diagnosis not present

## 2019-06-23 DIAGNOSIS — F319 Bipolar disorder, unspecified: Secondary | ICD-10-CM | POA: Diagnosis not present

## 2019-06-23 DIAGNOSIS — Z79899 Other long term (current) drug therapy: Secondary | ICD-10-CM | POA: Diagnosis not present

## 2019-06-23 DIAGNOSIS — Z886 Allergy status to analgesic agent status: Secondary | ICD-10-CM | POA: Diagnosis not present

## 2019-06-23 DIAGNOSIS — Z9119 Patient's noncompliance with other medical treatment and regimen: Secondary | ICD-10-CM | POA: Insufficient documentation

## 2019-06-23 DIAGNOSIS — F1210 Cannabis abuse, uncomplicated: Secondary | ICD-10-CM | POA: Diagnosis not present

## 2019-06-23 DIAGNOSIS — F259 Schizoaffective disorder, unspecified: Secondary | ICD-10-CM | POA: Insufficient documentation

## 2019-06-23 DIAGNOSIS — F17210 Nicotine dependence, cigarettes, uncomplicated: Secondary | ICD-10-CM | POA: Diagnosis not present

## 2019-06-23 DIAGNOSIS — I509 Heart failure, unspecified: Secondary | ICD-10-CM | POA: Diagnosis not present

## 2019-06-23 DIAGNOSIS — R45850 Homicidal ideations: Secondary | ICD-10-CM | POA: Diagnosis not present

## 2019-06-23 DIAGNOSIS — F1410 Cocaine abuse, uncomplicated: Secondary | ICD-10-CM | POA: Diagnosis not present

## 2019-06-23 DIAGNOSIS — Z9114 Patient's other noncompliance with medication regimen: Secondary | ICD-10-CM | POA: Diagnosis not present

## 2019-06-23 DIAGNOSIS — Z046 Encounter for general psychiatric examination, requested by authority: Secondary | ICD-10-CM | POA: Diagnosis present

## 2019-06-23 DIAGNOSIS — R0602 Shortness of breath: Secondary | ICD-10-CM

## 2019-06-23 LAB — COMPREHENSIVE METABOLIC PANEL
ALT: 16 U/L (ref 0–44)
AST: 25 U/L (ref 15–41)
Albumin: 3.6 g/dL (ref 3.5–5.0)
Alkaline Phosphatase: 88 U/L (ref 38–126)
Anion gap: 12 (ref 5–15)
BUN: 25 mg/dL — ABNORMAL HIGH (ref 6–20)
CO2: 32 mmol/L (ref 22–32)
Calcium: 9.5 mg/dL (ref 8.9–10.3)
Chloride: 95 mmol/L — ABNORMAL LOW (ref 98–111)
Creatinine, Ser: 1.5 mg/dL — ABNORMAL HIGH (ref 0.61–1.24)
GFR calc Af Amer: >60 mL/min (ref >60)
GFR calc non Af Amer: 53 mL/min — ABNORMAL LOW (ref >60)
Glucose, Bld: 115 mg/dL — ABNORMAL HIGH (ref 70–99)
Potassium: 3.7 mmol/L (ref 3.5–5.1)
Sodium: 139 mmol/L (ref 135–145)
Total Bilirubin: 0.4 mg/dL (ref 0.3–1.2)
Total Protein: 7.3 g/dL (ref 6.5–8.1)

## 2019-06-23 LAB — CBC WITH DIFFERENTIAL/PLATELET
Abs Immature Granulocytes: 0.02 10*3/uL (ref 0.00–0.07)
Basophils Absolute: 0 10*3/uL (ref 0.0–0.1)
Basophils Relative: 1 %
Eosinophils Absolute: 0.1 10*3/uL (ref 0.0–0.5)
Eosinophils Relative: 2 %
HCT: 47.1 % (ref 39.0–52.0)
Hemoglobin: 15.4 g/dL (ref 13.0–17.0)
Immature Granulocytes: 0 %
Lymphocytes Relative: 42 %
Lymphs Abs: 2.4 10*3/uL (ref 0.7–4.0)
MCH: 32.6 pg (ref 26.0–34.0)
MCHC: 32.7 g/dL (ref 30.0–36.0)
MCV: 99.8 fL (ref 80.0–100.0)
Monocytes Absolute: 0.7 10*3/uL (ref 0.1–1.0)
Monocytes Relative: 11 %
Neutro Abs: 2.5 10*3/uL (ref 1.7–7.7)
Neutrophils Relative %: 44 %
Platelets: 260 10*3/uL (ref 150–400)
RBC: 4.72 MIL/uL (ref 4.22–5.81)
RDW: 15.8 % — ABNORMAL HIGH (ref 11.5–15.5)
WBC: 5.8 10*3/uL (ref 4.0–10.5)
nRBC: 0 % (ref 0.0–0.2)

## 2019-06-23 LAB — RAPID URINE DRUG SCREEN, HOSP PERFORMED
Amphetamines: NOT DETECTED
Barbiturates: NOT DETECTED
Benzodiazepines: NOT DETECTED
Cocaine: POSITIVE — AB
Opiates: NOT DETECTED
Tetrahydrocannabinol: NOT DETECTED

## 2019-06-23 LAB — ETHANOL: Alcohol, Ethyl (B): <10 mg/dL (ref <10)

## 2019-06-23 LAB — SARS CORONAVIRUS 2 BY RT PCR (HOSPITAL ORDER, PERFORMED IN ~~LOC~~ HOSPITAL LAB): SARS Coronavirus 2: NEGATIVE

## 2019-06-23 NOTE — ED Provider Notes (Signed)
[place] DEPT Provider Note   CSN: [id number] Arrival date & time: 06/23/19  1656    History   Chief Complaint Chief Complaint  Patient presents with  . Medical Clearance    [name] is a 53 y.o. male.     The history is provided by the patient and medical records. No language interpreter was used.   [name] is a 53 y.o. male  with a PMH as listed below including CHF, HTN and schizoaffective disorder who presents to the Emergency Department brought in by [facility] under IVC at [facility] just prior to arrival.  Patient presented to [facility] for suicidal thoughts.  He was sent over for medical clearance.  He was recently discharged from hospital for CHF exacerbation.  He has had 6 admissions in the last 6 months.  He denies any chest pain or shortness of breath.  Denies any worsening swelling.  He does tell me that he has not gotten his fluid pill filled yet and does have history of noncompliance with his medications.   Per HPI from NP at [facility] earlier today:  "He states he is suicidal because he has been thinking about his grandmother who passed one or two months ago. He reports last Saturday, he ran his head into a wall purposely in a suicide attempt and states he lost consciousness although he did not seek medical help.  Reports he had a knife on him today which was confiscated by staff here at [facility] and reports he had the knife because he was thinking about hurting himself. Reports prior SA although refused to provided details. Reports prior psychiatric hospitalizations although refused to provide details. Reports he is homeless. Reports a history of substance abuse that includes crack cocaine and when asked how much he is using he stated, " I can not tell you that." Reports his last use was yesterday.  Denies other substance abuse or use. Reports yesterday he was diagnosed with stage 4 heart failure.As per chart review, he has had multiple admissions  in the past for similar presentation (SOB) and has been referred to cardiology outpatient although he has refused care and is noncompliant with medication regimen. Reports today that he is experiencing  shortness of breath, cough, and difficulty breathing.  His blood pressure sitting was 152-103 and standing 135/103. Reports a history of schizoaffective disorder. Reports homicidal thoughts although refuses to provide further details."  Past Medical History:  Diagnosis Date  . Asthma   . Bipolar 1 disorder ([place])   . CHF (congestive heart failure) ([place])   . Depression   . Homelessness   . Hypertension   . QT prolongation   . Schizoaffective disorder [facility])     Patient Active Problem List   Diagnosis Date Noted  . Shortness of breath   . CHF exacerbation ([place]) 06/21/2019  . QT prolongation 05/25/2019  . Cocaine abuse ([place])   . Chest pain 05/24/2019  . Elevated troponin   . Homelessness   . Suicidal ideations   . Acute on chronic systolic heart failure ([place]) 04/18/2019  . Bilateral foot pain 04/05/2019  . Acute on chronic clinical systolic heart failure ([place]) 04/04/2019  . Housing situation unstable 03/28/2019  . AKI (acute kidney injury) ([place])   . Bilateral lower extremity edema   . Acute on chronic HFrEF (heart failure with reduced ejection fraction) ([place]) 03/27/2019  . COPD exacerbation ([place])   . Acute exacerbation of CHF (congestive heart failure) ([place]) 03/04/2019  . Asthma  12/26/2016  . Tobacco use disorder 12/26/2016  . Schizophrenia (HCC) 12/26/2016  . Cocaine abuse with cocaine-induced mood disorder (HCC) 09/06/2016  . Alcohol use disorder, mild, abuse 07/31/2016  . Substance induced mood disorder (HCC) 07/30/2016  . Schizoaffective disorder, bipolar type (HCC) 05/31/2016  . Cocaine use disorder, severe, dependence (HCC) 05/31/2016  . Essential hypertension 05/31/2016    No past surgical history on file.      Home Medications    Prior to Admission medications    Medication Sig Start Date End Date Taking? Authorizing Provider  albuterol (PROVENTIL HFA;VENTOLIN HFA) 108 (90 Base) MCG/ACT inhaler Inhale 1-2 puffs into the lungs every 4 (four) hours as needed for shortness of breath. Patient not taking: Reported on 05/24/2019 08/22/18   Dione Booze, MD  ARIPiprazole (ABILIFY) 2 MG tablet Take 1 tablet (2 mg total) by mouth at bedtime. Patient not taking: Reported on 06/21/2019 05/26/19   Mirian Mo, MD  benzonatate (TESSALON) 100 MG capsule Take 1 capsule (100 mg total) by mouth 3 (three) times daily as needed for cough. Patient not taking: Reported on 06/23/2019 06/22/19   Dana Allan, MD  folic acid (FOLVITE) 1 MG tablet Take 1 tablet (1 mg total) by mouth daily. Patient not taking: Reported on 06/21/2019 05/27/19   Mirian Mo, MD  ipratropium (ATROVENT) 0.02 % nebulizer solution Take 2.5 mLs (0.5 mg total) by nebulization every 6 (six) hours as needed for wheezing or shortness of breath. Patient not taking: Reported on 05/12/2019 03/09/19   Claudean Severance, MD  thiamine 100 MG tablet Take 1 tablet (100 mg total) by mouth daily. Patient not taking: Reported on 06/21/2019 05/27/19   Mirian Mo, MD  torsemide (DEMADEX) 20 MG tablet Take 2 tablets (40 mg total) by mouth daily. Patient not taking: Reported on 06/23/2019 06/23/19 07/23/19  Dana Allan, MD    Family History Family History  Problem Relation Age of Onset  . Drug abuse Mother   . Suicidality Mother     Social History Social History   Tobacco Use  . Smoking status: Current Every Day Smoker    Years: 30.00    Types: Cigarettes  . Smokeless tobacco: Never Used  Substance Use Topics  . Alcohol use: Yes    Comment: infrequently  . Drug use: Yes    Types: Cocaine, Marijuana     Allergies   Acetaminophen and Latex   Review of Systems Review of Systems  Psychiatric/Behavioral: Positive for suicidal ideas.  All other systems reviewed and are negative.    Physical Exam Updated  Vital Signs BP (!) 139/94 (BP Location: Left Arm)   Pulse 85   Temp 97.9 F (36.6 C) (Oral)   Resp 16   Ht 5\' 9"  (1.753 m)   Wt 81.6 kg   SpO2 100%   BMI 26.58 kg/m   Physical Exam Vitals signs and nursing note reviewed.  Constitutional:      General: He is not in acute distress.    Appearance: He is well-developed.  HENT:     Head: Normocephalic and atraumatic.  Neck:     Musculoskeletal: Neck supple.     Comments: No JVD. Cardiovascular:     Rate and Rhythm: Normal rate and regular rhythm.     Heart sounds: Normal heart sounds. No murmur.  Pulmonary:     Effort: Pulmonary effort is normal. No respiratory distress.     Breath sounds: Normal breath sounds.     Comments: Lungs clear to auscultation bilaterally. Abdominal:  General: There is no distension.     Palpations: Abdomen is soft.     Tenderness: There is no abdominal tenderness.  Skin:    General: Skin is warm and dry.  Neurological:     Mental Status: He is alert and oriented to person, place, and time.  Psychiatric:     Comments: Calm, cooperative.  Does not appear to be responding to internal stimuli.      ED Treatments / Results  Labs (all labs ordered are listed, but only abnormal results are displayed) Labs Reviewed  COMPREHENSIVE METABOLIC PANEL - Abnormal; Notable for the following components:      Result Value   Chloride 95 (*)    Glucose, Bld 115 (*)    BUN 25 (*)    Creatinine, Ser 1.50 (*)    GFR calc non Af Amer 53 (*)    All other components within normal limits  CBC WITH DIFFERENTIAL/PLATELET - Abnormal; Notable for the following components:   RDW 15.8 (*)    All other components within normal limits  SARS CORONAVIRUS 2 (HOSPITAL ORDER, PERFORMED IN [facility])  ETHANOL  RAPID URINE DRUG SCREEN, HOSP PERFORMED    EKG None  Radiology No results found.  Procedures Procedures (including critical care time)  Medications Ordered in ED Medications - No data to  display   Initial Impression / Assessment and Plan / ED Course  I have reviewed the triage vital signs and the nursing notes.  Pertinent labs & imaging results that were available during my care of the patient were reviewed by me and considered in my medical decision making (see chart for details).       [name] is a 53 y.o. male who [facility].  Patient with complex medical history including severe CHF.  Unfortunately, he has not been very compliant with medications and treatment in the past. On exam today, his vitals are reassuring. Lungs clear. No overt signs of volume overload. Labs baseline. [facility].   Final Clinical Impressions(s) / ED Diagnoses   Final diagnoses:  Suicidal ideation    ED Discharge Orders    None       Marcia Lepera, Chase PicketJaime Pilcher, PA-C 06/23/19 2040    Geoffery Lyonselo, [place], MD 06/23/19 2120

## 2019-06-23 NOTE — H&P (Addendum)
Behavioral Health Medical Screening Exam  [name] is an 53 y.o. male.who presents as a walk-in to [name]. He was IVC'd. He is accompanied alone. During this evaluation, he is alert and oriented x3 although irritable and very limited when providing details on information provided.  He states he is suicidal because he has been thinking about his grandmother who passed one or two months ago. He reports last Saturday, he ran his head into a wall purposely in a suicide attempt and states he lost consciousness although he did not seek medical help.  Reports he had a knife on him today which was confiscated by staff here at [name] and reports he had the knife because he was thinking about hurting himself. Reports prior SA although refused to provided details. Reports prior psychiatric hospitalizations although refused to provide details. Reports he is homeless. Reports a history of substance abuse that includes crack cocaine and when asked how much he is using he stated, " I can not tell you that." Reports his last use was yesterday.  Denies other substance abuse or use. Reports yesterday he was diagnosed with stage 4 heart failure.As per chart review, he has had multiple admissions in the past for similar presentation (SOB) and has been referred to cardiology outpatient although he has refused care and is noncompliant with medication regimen. Reports today that he is experiencing  shortness of breath, cough, and difficulty breathing.  His blood pressure sitting was 152-103 and standing 135/103. Reports a history of schizoaffective disorder. Reports homicidal thoughts although refuses to provide further details.   Total Time spent with patient: 15 minutes  Psychiatric Specialty Exam: Physical Exam  Vitals reviewed. Constitutional: He is oriented to person, place, and time.  Neurological: He is alert and oriented to person, place, and time.    Review of Systems  Psychiatric/Behavioral: Positive for depression,  substance abuse and suicidal ideas. Negative for hallucinations.  All other systems reviewed and are negative.   There were no vitals taken for this visit.There is no height or weight on file to calculate BMI.  General Appearance: Guarded  Eye Contact:  Fair  Speech:  Clear and Coherent and Normal Rate  Volume:  Normal  Mood:  Depressed; irritable   Affect:  Restricted  Thought Process:  Coherent, Linear and Descriptions of Associations: Intact  Orientation:  Full (Time, Place, and Person)  Thought Content:  Logical  Suicidal Thoughts:  Yes.  with intent/plan  Homicidal Thoughts:  Yes.  without intent/plan  Memory:  Immediate;   Fair Recent;   Fair  Judgement:  Impaired  Insight:  Lacking  Psychomotor Activity:  Normal  Concentration: Concentration: Fair and Attention Span: Fair  Recall:  [employer] of Knowledge:Fair  Language: Good  Akathisia:  Negative  Handed:  Right  AIMS (if indicated):     Assets:  Desire for Improvement  Sleep:       Musculoskeletal: Strength & Muscle Tone: within normal limits Gait & Station: normal Patient leans: N/A  There were no vitals taken for this visit.  Recommendations:  Evidence of imminent risk to self or others at present.   Patient does meet criteria for psychiatric inpatient admission. Reccommended medical clearance and COVID testing with results before he is admitted to the [name] hospital. .     Mordecai Maes, NP 06/23/2019, 4:39 PM

## 2019-06-23 NOTE — BH Assessment (Signed)
Assessment Note  [name] is a  53 y.o. male who presents under IVC from [facility]. Pt is reporting suicidal & homicidal ideation. Pt has a history of schizoaffective dx, antisocial personality disorder, substance abuse and malingering. Pt reports current suicidal ideation with plans to cut his wrists. Past attempts include overdose & cut wrists.  Pt states he gave his knife to staff earlier when he had thought to cut himself. Pt reports he recently ran into a wall, full-speed & head first. No medical care at that time. Pt declined to identify plan & who he had HI toward. Pt acknowledges symptoms of Depression, including: increased isolating, anhedonia, decreased appetite & sleep. Pt denied AVH & other psychotic symptoms. Pt states current stressors include medical problems with his heart.   Pt is homeless. He reports no support. Pt states his grandmother who meant a lot to him died a couple months ago. Pt reports there is a family history of substance abuse. Pt states he is on Disability. Pt has limited insight and judgment. Pt's memory is intact. Legal history includes multiple charges & as a "habitual felon". ? Pt's OP history includes [facility]. IP history includes multiple facilities throughout [place] & [place]. Pt reports daily  Alcohol & cocaine abuse. ? MSE: Pt is disheveled, alert, oriented x4 with normal speech and normal motor behavior. Eye contact is fair. Pt's mood is suspicious and affect is irritable. Affect is congruent with mood. Thought process is coherent and relevant. There is no indication pt is currently responding to internal stimuli or experiencing delusional thought content. Pt was cooperative throughout assessment.   Disposition: [name] MagnusonLaShunda Thomas, NP recommends inpt tx Diagnosis: schizoaffective d/o  Past Medical History:  Past Medical History:  Diagnosis Date  . Asthma   . Bipolar 1 disorder (HCC)   . CHF (congestive heart failure) (HCC)   . Depression   . Homelessness   .  Hypertension   . QT prolongation   . Schizoaffective disorder (HCC)     No past surgical history on file.  Family History:  Family History  Problem Relation Age of Onset  . Drug abuse Mother   . Suicidality Mother     Social History:  reports that he has been smoking cigarettes. He has smoked for the past 30.00 years. He has never used smokeless tobacco. He reports current alcohol use. He reports current drug use. Drugs: Cocaine and Marijuana.  Additional Social History:  Alcohol / Drug Use Pain Medications: See MAR Prescriptions: See MAR Over the Counter: See MAR History of alcohol / drug use?: Yes Longest period of sobriety (when/how long): unknown Substance #1 Name of Substance 1: cocaine 1 - Amount (size/oz): UTA 1 - Frequency: q d 1 - Duration: years 1 - Last Use / Amount: 7/22//2020 Substance #2 Name of Substance 2: etoh 2 - Frequency: varies  CIWA: CIWA-Ar BP: 136/81 Pulse Rate: 96 COWS:    Allergies:  Allergies  Allergen Reactions  . Acetaminophen Hives, Nausea And Vomiting and Rash    Pt overdosed on bottle of APAP 05/06/16   . Latex Rash    Home Medications: (Not in a hospital admission)   OB/GYN Status:  No LMP for male patient.  General Assessment Data [facility] TTS Assessment: In system Is this a Tele or Face-to-Face Assessment?: Face-to-Face Is this an Initial Assessment or a Re-assessment for this encounter?: Initial Assessment Patient Accompanied by:: N/A Language Other than English: No Living Arrangements: Homeless/Shelter What gender do you  identify as?: Male Living Arrangements: Alone Can pt return to current living arrangement?: Yes Admission Status: Voluntary Is patient capable of signing voluntary admission?: Yes Referral Source: Self/Family/Friend     Crisis Care Plan Living Arrangements: Alone Name of Psychiatrist: Beverly Sessions Name of Therapist: [facility]  Education Status Is patient  currently in school?: No Is the patient employed, unemployed or receiving disability?: Receiving disability income  Risk to self with the past 6 months Suicidal Ideation: Yes-Currently Present Has patient been a risk to self within the past 6 months prior to admission? : Yes Suicidal Intent: No Has patient had any suicidal intent within the past 6 months prior to admission? : No Is patient at risk for suicide?: Yes Suicidal Plan?: Yes-Currently Present Has patient had any suicidal plan within the past 6 months prior to admission? : Yes Specify Current Suicidal Plan: cut wrists Access to Means: Yes What has been your use of drugs/alcohol within the last 12 months?: cocaine & alcohol daily Previous Attempts/Gestures: Yes How many times?: (multiple per pt) Other Self Harm Risks: ran head first into a wall Triggers for Past Attempts: Unknown Family Suicide History: Yes(mother) Recent stressful life event(s): Other (Comment)(health issues) Persecutory voices/beliefs?: Yes(not currently) Depression: Yes Depression Symptoms: Despondent, Isolating, Loss of interest in usual pleasures, Feeling angry/irritable Substance abuse history and/or treatment for substance abuse?: Yes Suicide prevention information given to non-admitted patients: Not applicable  Risk to Others within the past 6 months Homicidal Ideation: Yes-Currently Present Does patient have any lifetime risk of violence toward others beyond the six months prior to admission? : Unknown Thoughts of Harm to Others: Yes-Currently Present Comment - Thoughts of Harm to Others: HI toward someone, declines to identify Current Homicidal Intent: No Current Homicidal Plan: (will not elaborate) History of harm to others?: (unknown) Assessment of Violence: None Noted Does patient have access to weapons?: Yes (Comment)(denies gun; has a knife) Criminal Charges Pending?: No Does patient have a court date: No Is patient on probation?:  No(habitual felon)  Psychosis Hallucinations: None noted Delusions: None noted  Mental Status Report Appearance/Hygiene: Body odor, Disheveled Eye Contact: Fair Motor Activity: Freedom of movement Speech: Aggressive, Logical/coherent Level of Consciousness: Alert Mood: Suspicious, Irritable Affect: Irritable Anxiety Level: Minimal Thought Processes: Relevant Judgement: Partial Orientation: Appropriate for developmental age Obsessive Compulsive Thoughts/Behaviors: None  Cognitive Functioning Concentration: Fair Memory: Recent Intact, Remote Intact Is patient IDD: No Insight: Poor Impulse Control: Poor Appetite: Fair Have you had any weight changes? : No Change Sleep: Decreased Total Hours of Sleep: (UTA) Vegetative Symptoms: None  ADLScreening [facility] Assessment Services) Patient's cognitive ability adequate to safely complete daily activities?: Yes Patient able to express need for assistance with ADLs?: Yes Independently performs ADLs?: Yes (appropriate for developmental age)  Prior Inpatient Therapy Prior Inpatient Therapy: Yes Prior Therapy Dates: (multiple) Reason for Treatment: schizoaffective d/o  Prior Outpatient Therapy Prior Outpatient Therapy: Yes Prior Therapy Dates: ongoing & intermittent Prior Therapy Facilty/Provider(s): [facility] & others in [place] Reason for Treatment: schizoaffective Does patient have an ACCT team?: No Does patient have Intensive In-House Services?  : No Does patient have [facility] services? : No Does patient have P4CC services?: No  ADL Screening (condition at time of admission) Patient's cognitive ability adequate to safely complete daily activities?: Yes Is the patient deaf or have difficulty hearing?: No Does the patient have difficulty seeing, even when wearing glasses/contacts?: No Does the patient have difficulty concentrating, remembering, or making decisions?: No Patient able to express need for assistance with ADLs?: Yes  Does  the patient have difficulty dressing or bathing?: No Independently performs ADLs?: Yes (appropriate for developmental age) Does the patient have difficulty walking or climbing stairs?: Yes Weakness of Legs: None Weakness of Arms/Hands: None  Home Assistive Devices/Equipment Home Assistive Devices/Equipment: None  Therapy Consults (therapy consults require a physician order) PT Evaluation Needed: No OT Evalulation Needed: No SLP Evaluation Needed: No Abuse/Neglect Assessment (Assessment to be complete while patient is alone) Abuse/Neglect Assessment Can Be Completed: Yes Physical Abuse: Denies Verbal Abuse: Denies Sexual Abuse: Denies Exploitation of patient/patient's resources: Denies Self-Neglect: Denies Values / Beliefs Cultural Requests During Hospitalization: None Spiritual Requests During Hospitalization: None Consults Spiritual Care Consult Needed: No Social Work Consult Needed: No Merchant navy officer (For Healthcare) Does Patient Have a Medical Advance Directive?: No Would patient like information on creating a medical advance directive?: No - Patient declined          Disposition: [name] Magnuson, NP recommends inpt tx Disposition Initial Assessment Completed for this Encounter: Yes Disposition of Patient: Admit Type of inpatient treatment program: Adult  On Site Evaluation by:   Reviewed with Physician:    Clearnce Sorrel 06/23/2019 5:21 PM

## 2019-06-23 NOTE — ED Triage Notes (Signed)
Pt brought to room 30 TCU. Pt brought by [employer]. Pt  IVCd . Pt brought from [facility]. SI and hostile. Pt calm and cooperative.

## 2019-06-24 ENCOUNTER — Encounter (HOSPITAL_COMMUNITY): Payer: Self-pay | Admitting: *Deleted

## 2019-06-24 ENCOUNTER — Other Ambulatory Visit: Payer: Self-pay

## 2019-06-24 ENCOUNTER — Observation Stay (HOSPITAL_COMMUNITY)
Admission: AD | Admit: 2019-06-24 | Discharge: 2019-06-24 | Disposition: A | Discharge status: Home / Self Care | Payer: Medicaid Other | Attending: Psychiatry | Admitting: Psychiatry

## 2019-06-24 DIAGNOSIS — Z1159 Encounter for screening for other viral diseases: Secondary | ICD-10-CM | POA: Insufficient documentation

## 2019-06-24 DIAGNOSIS — F1410 Cocaine abuse, uncomplicated: Secondary | ICD-10-CM | POA: Insufficient documentation

## 2019-06-24 DIAGNOSIS — Z886 Allergy status to analgesic agent status: Secondary | ICD-10-CM | POA: Insufficient documentation

## 2019-06-24 DIAGNOSIS — F259 Schizoaffective disorder, unspecified: Secondary | ICD-10-CM | POA: Diagnosis not present

## 2019-06-24 DIAGNOSIS — F250 Schizoaffective disorder, bipolar type: Secondary | ICD-10-CM

## 2019-06-24 DIAGNOSIS — F319 Bipolar disorder, unspecified: Secondary | ICD-10-CM | POA: Insufficient documentation

## 2019-06-24 DIAGNOSIS — R45850 Homicidal ideations: Secondary | ICD-10-CM

## 2019-06-24 DIAGNOSIS — Z590 Homelessness: Secondary | ICD-10-CM | POA: Insufficient documentation

## 2019-06-24 DIAGNOSIS — Z91040 Latex allergy status: Secondary | ICD-10-CM | POA: Insufficient documentation

## 2019-06-24 DIAGNOSIS — Z9114 Patient's other noncompliance with medication regimen: Secondary | ICD-10-CM | POA: Insufficient documentation

## 2019-06-24 DIAGNOSIS — R45851 Suicidal ideations: Secondary | ICD-10-CM | POA: Diagnosis not present

## 2019-06-24 DIAGNOSIS — F17210 Nicotine dependence, cigarettes, uncomplicated: Secondary | ICD-10-CM | POA: Insufficient documentation

## 2019-06-24 DIAGNOSIS — F251 Schizoaffective disorder, depressive type: Secondary | ICD-10-CM

## 2019-06-24 DIAGNOSIS — F1210 Cannabis abuse, uncomplicated: Secondary | ICD-10-CM | POA: Insufficient documentation

## 2019-06-24 DIAGNOSIS — F602 Antisocial personality disorder: Secondary | ICD-10-CM | POA: Insufficient documentation

## 2019-06-24 DIAGNOSIS — Z79899 Other long term (current) drug therapy: Secondary | ICD-10-CM | POA: Insufficient documentation

## 2019-06-24 DIAGNOSIS — I509 Heart failure, unspecified: Secondary | ICD-10-CM | POA: Insufficient documentation

## 2019-06-24 DIAGNOSIS — I110 Hypertensive heart disease with heart failure: Secondary | ICD-10-CM | POA: Insufficient documentation

## 2019-06-24 MED ORDER — VITAMIN B-1 100 MG PO TABS
100.0000 mg | ORAL_TABLET | Freq: Every day | ORAL | Status: DC
  Administered 2019-06-24: 100 mg via ORAL
  Filled 2019-06-24: qty 1

## 2019-06-24 MED ORDER — FOLIC ACID 1 MG PO TABS
1.0000 mg | ORAL_TABLET | Freq: Every day | ORAL | Status: DC
  Administered 2019-06-24: 1 mg via ORAL
  Filled 2019-06-24: qty 1

## 2019-06-24 MED ORDER — MAGNESIUM HYDROXIDE 400 MG/5ML PO SUSP: 30.0000 mL | Freq: Every day | ORAL | Status: DC | PRN

## 2019-06-24 MED ORDER — IBUPROFEN 600 MG PO TABS
600.0000 mg | ORAL_TABLET | ORAL | Status: DC | PRN
  Administered 2019-06-24: 600 mg via ORAL
  Filled 2019-06-24: qty 1

## 2019-06-24 MED ORDER — ALBUTEROL SULFATE HFA 108 (90 BASE) MCG/ACT IN AERS: 1.0000 | INHALATION_SPRAY | RESPIRATORY_TRACT | Status: DC | PRN

## 2019-06-24 MED ORDER — ARIPIPRAZOLE 2 MG PO TABS: 2.0000 mg | ORAL_TABLET | Freq: Every day | ORAL | Status: DC

## 2019-06-24 MED ORDER — TORSEMIDE 20 MG PO TABS
40.0000 mg | ORAL_TABLET | Freq: Every day | ORAL | Status: DC
  Administered 2019-06-24: 40 mg via ORAL
  Filled 2019-06-24 (×4): qty 2

## 2019-06-24 MED ORDER — ALUM & MAG HYDROXIDE-SIMETH 200-200-20 MG/5ML PO SUSP: 30.0000 mL | ORAL | Status: DC | PRN

## 2019-06-24 NOTE — BH Assessment (Signed)
[facility] Assessment Progress Note  Per Buford Dresser, DO, this pt does not require psychiatric hospitalization at this time.  Pt presents under IVC initiated by [facility] staff, and upheld by EDP [name] Speak, MD, which Dr Mariea Clonts will be rescinding.  Pt is to be discharged from the [facility] Observation Unit with recommendation to follow up with [facility].  This has been included in pt's discharge instructions.  Pt's nurse has been notified.  Jalene Mullet, [place] Triage Specialist [phone]

## 2019-06-24 NOTE — Progress Notes (Signed)
IVC after reporting SI and Hi thoughts with plan to cut wrists. Hx of substance abuse, schizoaffective d/o, antisocial personality d/o, anger and depression. Medical hx of CHF, HTN and Asthma.  Reports being homeless. Upon arrival, cooperative.  Sandwich and ginger ale consumed. Positive for SI/HI. No plan or intent. Contracts for safety. Was in room, observed talking to self and pacing. encouraged to go to sleep, receptive.

## 2019-06-24 NOTE — Progress Notes (Signed)
Patient ID: [name], male   DOB: 1966/08/02, 53 y.o.   MRN: [id number]  D: Pt alert and oriented on the unit. Pt was notified of discharge orders from MD. Pt wanted his clothes and belongings and wanted to leave. "I'm ready to leave now!" "I don't wanna waste my time!"  A: Education, support, and encouragement provided. Discharge summary, medications and follow up appointments reviewed, however, pt refused his discharge paperwork. Pt's belongings returned.  R: Pt denies any concerns at this time. Pt ambulatory on and off unit. Pt discharged to lobby.

## 2019-06-24 NOTE — Discharge Instructions (Signed)
For your mental health needs, you are advised to follow up with [facility].  Call them at your earliest opportunity to schedule an intake appointment: ° °     [facility] °     [address] °     [place], [place] [address] °     [phone] °     Crisis number: [phone] °

## 2019-06-24 NOTE — Progress Notes (Signed)
Patient ID: [name], male   DOB: 1966/07/21, 53 y.o.   MRN: [id number]  Pt inquired about his belongings. Per Obs notes/chart, pt did not have any belongings here on the unit. RN contacted [facility] and arranged for his belongings to be sent via security to Obs/BHU. Pt's suitcase, duffle bag, and 2 bags of clothing and sneakers were brought. Pt made aware and will receive belongs at discharge.

## 2019-06-24 NOTE — Progress Notes (Signed)
Patient ID: [name], male   DOB: 11/30/1966, 53 y.o.   MRN: [id number]   D: Pt alert and oriented on the unit. Pt ate breakfast, paced the hall, returned to his room and fell asleep. Pt asleep with even and unlabored respirations. No signs of distress or discomfort noted. Pt is pleasant and cooperative.  A: Education, support and encouragement provided, q15 minute safety checks remain in effect. Medications administered per MD orders.  R: No reactions/side effects to medicine noted. Pt denies any concerns at this time, and verbally contracts for safety. Pt remains safe on the unit.

## 2019-06-24 NOTE — [facility] Suicide Risk Assessment (Cosign Needed)
Suicide Risk Assessment  Discharge Assessment   [facility] Discharge Suicide Risk Assessment   Principal Problem: Schizoaffective disorder [facility]) Discharge Diagnoses: Principal Problem:   Schizoaffective disorder ([place])   Total Time spent with patient: 30 minutes  Musculoskeletal: Strength & Muscle Tone: within normal limits Gait & Station: normal Patient leans: N/A  Psychiatric Specialty Exam:   Blood pressure (!) 135/93, pulse 81, temperature 98.4 F (36.9 C), temperature source Oral, resp. rate 16, SpO2 100 %.There is no height or weight on file to calculate BMI.  General Appearance: Casual  Eye Contact::  Fair  Speech:  Clear and Coherent409  Volume:  Decreased  Mood:  Irritable  Affect:  Congruent  Thought Process:  Coherent, Linear and Descriptions of Associations: Intact  Orientation:  Full (Time, Place, and Person)  Thought Content:  Logical  Suicidal Thoughts:  Yes.  without intent/plan  Homicidal Thoughts:  No  Memory:  Immediate;   Good Recent;   Fair Remote;   Fair  Judgement:  Poor  Insight:  Lacking  Psychomotor Activity:  Normal  Concentration:  Fair  Recall:  [employer] of Knowledge:Good  Language: Good  Akathisia:  Negative  Handed:  Right  AIMS (if indicated):     Assets:  Agricultural consultant Social Support  Sleep:     Cognition: WNL  ADL's:  Intact   Pt was seen, face to face, chart reviewed with treatment team and Dr Mariea Clonts. Pt presented to [facility] under IVC from [facility]. He presented to [facility] and stated he was suicidal and they placed him under IVC. He was admitted to OBS for safety. He was assessed this morning by psychiatry. He is depressed and stated he has suicidal thoughts without a plan. He stated he has homicidal thoughts toward "just people in general." He has been diagnosed with stage 4 CHF and has been hospitalized several times recently for heart related issues. He continues to smoke crack cocaine, UDS is  positive for cocaine, BAL negative.  He is requesting to go to [facility] because his condition is depressing. He is homeless. He stated he has attempted to hurt himself before; last time was last week when he ran head first into a wall but he did not seek any hospital care at that time.  Per chart review, Pt has been refusing cardiology treatment and mental health medications when he is hospitalized for medical reasons. He has been provided with outpatient resources multiple times but he fails to follow up. Pt will be provided with outpatient resources for follow up. Pt is psychiatrically clear.    Demographic Factors:  Male, Low socioeconomic status and Unemployed  Loss Factors: Financial problems/change in socioeconomic status  Historical Factors: Family history of mental illness or substance abuse  Risk Reduction Factors:   Sense of responsibility to family  Continued Clinical Symptoms:  Bipolar Disorder:   Depressive phase Alcohol/Substance Abuse/Dependencies Previous Psychiatric Diagnoses and Treatments Medical Diagnoses and Treatments/Surgeries  Cognitive Features That Contribute To Risk:  Closed-mindedness    Suicide Risk:  Minimal: No identifiable suicidal ideation.  Patients presenting with no risk factors but with morbid ruminations; may be classified as minimal risk based on the severity of the depressive symptoms   Plan Of Care/Follow-up recommendations:  Activity:  as tolerated Diet:  heart healthy  Ethelene Hal, NP 06/24/2019, 10:43 AM

## 2019-06-24 NOTE — ED Notes (Signed)
Report called to Mel[place] at [facility] and called [employer] for transport to [facility].
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
## 2019-06-24 NOTE — Progress Notes (Signed)
[facility]  Reason for Crisis Plan:  Chronic Mental Illness/Medical Illness, Crisis Stabilization and Medication Management   Plan of Care:  Referral for Inpatient Hospitalization and Referral for IOP  Family Support:      Current Living Environment:  Living Arrangements: Alone  Insurance:   Hospital Account    Name Acct ID Class Status Primary Coverage   [name], [name] [id number] [place] - MEDICAID OF [place]        Guarantor Account (for Hospital Account [id number])    Name Relation to Pt Service Area Active? Acct Type   [name] Self [name] Yes [facility]   Address Phone       [address] [place], [place] [address] [phone])          Coverage Information (for Hospital Account [id number])    F/O Payor/Plan Precert #   MEDICAID [place]/MEDICAID OF [place]    Subscriber Subscriber #   [name], [name] [id number]   Address Phone   [address] [place], [place] [address] [phone]      Legal Guardian:     Primary Care Provider:  Medicine, [facility]  Current Outpatient Providers:  [name]  Psychiatrist:  Name of Psychiatrist: Beverly [name]  Counselor/Therapist:  Name of Therapist: Beverly [name]  Compliant with Medications:  No  Additional Information:   [name] 7/24/20204:55 AM

## 2019-09-08 DIAGNOSIS — J206 Acute bronchitis due to rhinovirus: Secondary | ICD-10-CM | POA: Insufficient documentation

## 2019-12-07 ENCOUNTER — Inpatient Hospital Stay (HOSPITAL_BASED_OUTPATIENT_CLINIC_OR_DEPARTMENT_OTHER)
Admission: EM | Admit: 2019-12-07 | Discharge: 2019-12-09 | DRG: 140 | Disposition: A | Discharge status: Home / Self Care | Payer: 59 | Attending: Cardiovascular Disease | Admitting: Cardiovascular Disease

## 2019-12-07 ENCOUNTER — Encounter: Payer: Self-pay | Admitting: Internal Medicine

## 2019-12-07 ENCOUNTER — Other Ambulatory Visit: Payer: Self-pay

## 2019-12-07 ENCOUNTER — Other Ambulatory Visit: Payer: Self-pay | Admitting: Acute Care

## 2019-12-07 ENCOUNTER — Inpatient Hospital Stay (HOSPITAL_COMMUNITY): Payer: 59 | Admitting: Cardiovascular Disease

## 2019-12-07 DIAGNOSIS — R000 Tachycardia, unspecified: Secondary | ICD-10-CM | POA: Diagnosis present

## 2019-12-07 DIAGNOSIS — R7989 Other specified abnormal findings of blood chemistry: Secondary | ICD-10-CM

## 2019-12-07 DIAGNOSIS — G629 Polyneuropathy, unspecified: Secondary | ICD-10-CM | POA: Diagnosis present

## 2019-12-07 DIAGNOSIS — R0602 Shortness of breath: Secondary | ICD-10-CM

## 2019-12-07 DIAGNOSIS — R45851 Suicidal ideations: Secondary | ICD-10-CM | POA: Diagnosis present

## 2019-12-07 DIAGNOSIS — F1410 Cocaine abuse, uncomplicated: Secondary | ICD-10-CM | POA: Diagnosis present

## 2019-12-07 DIAGNOSIS — F1010 Alcohol abuse, uncomplicated: Secondary | ICD-10-CM | POA: Diagnosis present

## 2019-12-07 DIAGNOSIS — J449 Chronic obstructive pulmonary disease, unspecified: Secondary | ICD-10-CM

## 2019-12-07 DIAGNOSIS — I219 Acute myocardial infarction, unspecified: Secondary | ICD-10-CM

## 2019-12-07 DIAGNOSIS — I509 Heart failure, unspecified: Secondary | ICD-10-CM

## 2019-12-07 DIAGNOSIS — F1510 Other stimulant abuse, uncomplicated: Secondary | ICD-10-CM | POA: Diagnosis present

## 2019-12-07 DIAGNOSIS — I110 Hypertensive heart disease with heart failure: Secondary | ICD-10-CM | POA: Diagnosis present

## 2019-12-07 DIAGNOSIS — Z20822 Contact with and (suspected) exposure to covid-19: Secondary | ICD-10-CM | POA: Diagnosis present

## 2019-12-07 DIAGNOSIS — R9431 Abnormal electrocardiogram [ECG] [EKG]: Secondary | ICD-10-CM

## 2019-12-07 DIAGNOSIS — F17210 Nicotine dependence, cigarettes, uncomplicated: Secondary | ICD-10-CM | POA: Diagnosis present

## 2019-12-07 DIAGNOSIS — J441 Chronic obstructive pulmonary disease with (acute) exacerbation: Principal | ICD-10-CM | POA: Diagnosis present

## 2019-12-07 DIAGNOSIS — I5023 Acute on chronic systolic (congestive) heart failure: Secondary | ICD-10-CM | POA: Diagnosis present

## 2019-12-07 LAB — STANDARD DRUG SCREEN, URN
Acetaminophen Qualitative, URN: NEGATIVE
Alcohol (Ethyl), URN: NEGATIVE mg/dL
Amphet/Methamphetamine Qual,URN: POSITIVE — AB
Barbiturate (Qual), URN: NEGATIVE
Benzodiazepines (Qual), URN: NEGATIVE
Cannabinoids (Qual), URN: POSITIVE — AB
Cocaine (Qual), URN: POSITIVE — AB
Methadone (Qual), URN: NEGATIVE
Opiates (Qual), URN: NEGATIVE
Phencyclidine (Qual), URN: NEGATIVE
Tricyclic Antidepressants, URN: NEGATIVE

## 2019-12-07 LAB — COMPREHENSIVE METABOLIC PANEL
ALT (GPT): 29 U/L (ref 10–48)
AST (GOT): 67 U/L — ABNORMAL HIGH (ref 9–38)
Albumin: 4.3 g/dL (ref 3.5–5.2)
Alkaline Phosphatase (Total): 76 U/L (ref 39–139)
Anion Gap: 15 — ABNORMAL HIGH (ref 4–12)
Bilirubin (Total): 1.5 mg/dL — ABNORMAL HIGH (ref 0.2–1.3)
Calcium: 9.9 mg/dL (ref 8.9–10.2)
Carbon Dioxide, Total: 23 meq/L (ref 22–32)
Chloride: 97 meq/L — ABNORMAL LOW (ref 98–108)
Creatinine: 1.21 mg/dL — ABNORMAL HIGH (ref 0.51–1.18)
Glucose: 81 mg/dL (ref 62–125)
Potassium: 4.5 meq/L (ref 3.6–5.2)
Protein (Total): 7.3 g/dL (ref 6.0–8.2)
Sodium: 135 meq/L (ref 135–145)
Urea Nitrogen: 26 mg/dL — ABNORMAL HIGH (ref 8–21)
eGFR by CKD-EPI: >60 mL/min/{1.73_m2} (ref >59)

## 2019-12-07 LAB — ALCOHOL (ETHYL): Alcohol (Ethyl): NEGATIVE mg/dL

## 2019-12-07 LAB — CBC, DIFF
% Basophils: 0 %
% Eosinophils: 0 %
% Immature Granulocytes: 0 %
% Lymphocytes: 22 %
% Monocytes: 8 %
% Neutrophils: 70 %
% Nucleated RBC: 0 %
Absolute Eosinophil Count: 0.01 10*3/uL (ref 0.00–0.50)
Absolute Lymphocyte Count: 1.73 10*3/uL (ref 1.00–4.80)
Basophils: 0.02 10*3/uL (ref 0.00–0.20)
Hematocrit: 43 % (ref 38–50)
Hemoglobin: 14.8 g/dL (ref 13.0–18.0)
Immature Granulocytes: 0.03 10*3/uL (ref 0.00–0.05)
MCH: 33.6 pg (ref 27.3–33.6)
MCHC: 34.3 g/dL (ref 32.2–36.5)
MCV: 98 fL (ref 81–98)
Monocytes: 0.62 10*3/uL (ref 0.00–0.80)
Neutrophils: 5.34 10*3/uL (ref 1.80–7.00)
Nucleated RBC: 0 10*3/uL
Platelet Count: 283 10*3/uL (ref 150–400)
RBC: 4.4 10*6/uL (ref 4.40–5.60)
RDW-CV: 14 % (ref 11.6–14.4)
WBC: 7.75 10*3/uL (ref 4.30–10.00)

## 2019-12-07 LAB — PROTHROMBIN & PTT
Partial Thromboplastin Time: 30 s (ref 22–35)
Prothrombin INR: 1.1 (ref 0.8–1.3)
Prothrombin Time Patient: 14 s (ref 10.7–15.6)

## 2019-12-07 LAB — COVID-19 QUALITATIVE PCR INDICATION

## 2019-12-07 LAB — TROPONIN_I
Troponin_I Interpretation: ELEVATED
Troponin_I Interpretation: ELEVATED
Troponin_I: 0.07 ng/mL — ABNORMAL HIGH (ref <0.04)
Troponin_I: 0.11 ng/mL — ABNORMAL HIGH (ref <0.04)

## 2019-12-07 LAB — B_TYPE NATRIURETIC PEPTIDE: B_Type Natriuretic Peptide: 639 pg/mL — ABNORMAL HIGH (ref <101)

## 2019-12-08 ENCOUNTER — Other Ambulatory Visit (HOSPITAL_COMMUNITY): Payer: Self-pay

## 2019-12-08 ENCOUNTER — Other Ambulatory Visit: Payer: Self-pay

## 2019-12-08 ENCOUNTER — Encounter (HOSPITAL_BASED_OUTPATIENT_CLINIC_OR_DEPARTMENT_OTHER): Payer: Self-pay | Admitting: Psychiatry

## 2019-12-08 DIAGNOSIS — M79672 Pain in left foot: Secondary | ICD-10-CM

## 2019-12-08 DIAGNOSIS — E8770 Fluid overload, unspecified: Secondary | ICD-10-CM

## 2019-12-08 DIAGNOSIS — F10959 Alcohol use, unspecified with alcohol-induced psychotic disorder, unspecified: Secondary | ICD-10-CM

## 2019-12-08 DIAGNOSIS — I509 Heart failure, unspecified: Secondary | ICD-10-CM

## 2019-12-08 DIAGNOSIS — R9431 Abnormal electrocardiogram [ECG] [EKG]: Secondary | ICD-10-CM

## 2019-12-08 DIAGNOSIS — R4189 Other symptoms and signs involving cognitive functions and awareness: Secondary | ICD-10-CM

## 2019-12-08 DIAGNOSIS — J441 Chronic obstructive pulmonary disease with (acute) exacerbation: Secondary | ICD-10-CM

## 2019-12-08 DIAGNOSIS — I10 Essential (primary) hypertension: Secondary | ICD-10-CM

## 2019-12-08 DIAGNOSIS — F1020 Alcohol dependence, uncomplicated: Secondary | ICD-10-CM

## 2019-12-08 DIAGNOSIS — R0600 Dyspnea, unspecified: Secondary | ICD-10-CM

## 2019-12-08 DIAGNOSIS — M79671 Pain in right foot: Secondary | ICD-10-CM

## 2019-12-08 LAB — CBC (HEMOGRAM)
Hematocrit: 41 % (ref 38–50)
Hemoglobin: 14 g/dL (ref 13.0–18.0)
MCH: 33.2 pg (ref 27.3–33.6)
MCHC: 34.5 g/dL (ref 32.2–36.5)
MCV: 96 fL (ref 81–98)
Platelet Count: 222 10*3/uL (ref 150–400)
RBC: 4.22 10*6/uL — ABNORMAL LOW (ref 4.40–5.60)
RDW-CV: 14.4 % (ref 11.6–14.4)
WBC: 6.65 10*3/uL (ref 4.30–10.00)

## 2019-12-08 LAB — EKG 12 LEAD
Atrial Rate: 92 {beats}/min
Atrial Rate: 87 {beats}/min
Atrial Rate: 72 {beats}/min
Diagnosis: NORMAL
Diagnosis: NORMAL
Diagnosis: NORMAL
P Axis: 74 degrees
P Axis: 79 degrees
P Axis: 64 degrees
P-R Interval: 162 ms
P-R Interval: 162 ms
P-R Interval: 168 ms
Q-T Interval: 442 ms
Q-T Interval: 446 ms
Q-T Interval: 482 ms
QRS Duration: 84 ms
QRS Duration: 86 ms
QRS Duration: 88 ms
QTC Calculation: 546 ms
QTC Calculation: 536 ms
QTC Calculation: 527 ms
R Axis: 61 degrees
R Axis: 40 degrees
R Axis: 57 degrees
T Axis: -89 degrees
T Axis: 171 degrees
T Axis: 262 degrees
Ventricular Rate: 92 {beats}/min
Ventricular Rate: 87 {beats}/min
Ventricular Rate: 72 {beats}/min

## 2019-12-08 LAB — COVID-19 CORONAVIRUS QUALITATIVE PCR: COVID-19 Coronavirus Qual PCR Result: NOT DETECTED

## 2019-12-08 LAB — BASIC METABOLIC PANEL
Anion Gap: 8 (ref 4–12)
Calcium: 8.8 mg/dL — ABNORMAL LOW (ref 8.9–10.2)
Carbon Dioxide, Total: 28 meq/L (ref 22–32)
Chloride: 100 meq/L (ref 98–108)
Creatinine: 1.23 mg/dL — ABNORMAL HIGH (ref 0.51–1.18)
Glucose: 94 mg/dL (ref 62–125)
Potassium: 4.1 meq/L (ref 3.6–5.2)
Sodium: 136 meq/L (ref 135–145)
Urea Nitrogen: 30 mg/dL — ABNORMAL HIGH (ref 8–21)
eGFR by CKD-EPI: >60 mL/min/{1.73_m2} (ref >59)

## 2019-12-08 LAB — TROPONIN_I
Troponin_I Interpretation: ELEVATED
Troponin_I: 0.07 ng/mL — ABNORMAL HIGH (ref <0.04)

## 2019-12-08 LAB — TSH WITH REFLEXIVE FREE T4: TSH with Reflexive Free T4: 2.353 u[IU]/mL (ref 0.400–5.000)

## 2019-12-08 LAB — LAB ADD ON ORDER

## 2019-12-08 LAB — MAGNESIUM: Magnesium: 2.1 mg/dL (ref 1.8–2.4)

## 2019-12-08 LAB — VITAMIN B12 (COBALAMIN): Vitamin B12 (Cobalamin): 448 pg/mL (ref 180–914)

## 2019-12-09 ENCOUNTER — Other Ambulatory Visit (HOSPITAL_COMMUNITY): Payer: Self-pay | Admitting: Internal Medicine

## 2019-12-09 DIAGNOSIS — R45851 Suicidal ideations: Secondary | ICD-10-CM

## 2019-12-09 DIAGNOSIS — F1010 Alcohol abuse, uncomplicated: Secondary | ICD-10-CM

## 2019-12-09 DIAGNOSIS — F1590 Other stimulant use, unspecified, uncomplicated: Secondary | ICD-10-CM

## 2019-12-09 DIAGNOSIS — R0609 Other forms of dyspnea: Secondary | ICD-10-CM

## 2019-12-09 DIAGNOSIS — F1490 Cocaine use, unspecified, uncomplicated: Secondary | ICD-10-CM

## 2019-12-09 DIAGNOSIS — I509 Heart failure, unspecified: Secondary | ICD-10-CM

## 2019-12-09 DIAGNOSIS — I110 Hypertensive heart disease with heart failure: Secondary | ICD-10-CM

## 2019-12-09 MED ORDER — LISINOPRIL 5 MG OR TABS
ORAL_TABLET | ORAL | 0 refills | Status: AC
Start: 2019-12-09 — End: 2020-12-08

## 2019-12-09 MED ORDER — TIOTROPIUM BROMIDE MONOHYDRATE 18 MCG IN CAPS
ORAL_CAPSULE | RESPIRATORY_TRACT | 0 refills | Status: AC
Start: 2019-12-09 — End: 2020-12-08

## 2019-12-09 MED ORDER — ALBUTEROL SULFATE HFA 108 (90 BASE) MCG/ACT IN AERS
INHALATION_SPRAY | RESPIRATORY_TRACT | 0 refills | Status: AC
Start: 2019-12-09 — End: 2020-12-08

## 2019-12-09 MED ORDER — TORSEMIDE 20 MG OR TABS
ORAL_TABLET | ORAL | 0 refills | Status: AC
Start: 2019-12-09 — End: 2020-12-08

## 2019-12-09 MED ORDER — METOPROLOL SUCCINATE ER 25 MG OR TB24
EXTENDED_RELEASE_TABLET | ORAL | 0 refills | Status: AC
Start: 2019-12-09 — End: 2020-12-08

## 2019-12-09 MED ORDER — TERBINAFINE HCL 1 % EX CREA
TOPICAL_CREAM | CUTANEOUS | 0 refills | Status: AC
Start: 2019-12-09 — End: 2020-12-08

## 2019-12-14 ENCOUNTER — Inpatient Hospital Stay: Payer: Self-pay

## 2019-12-15 ENCOUNTER — Encounter (HOSPITAL_BASED_OUTPATIENT_CLINIC_OR_DEPARTMENT_OTHER): Payer: Self-pay | Admitting: Registered Nurse

## 2019-12-15 ENCOUNTER — Encounter (HOSPITAL_BASED_OUTPATIENT_CLINIC_OR_DEPARTMENT_OTHER): Payer: 59

## 2019-12-15 ENCOUNTER — Encounter (HOSPITAL_BASED_OUTPATIENT_CLINIC_OR_DEPARTMENT_OTHER): Payer: 59 | Admitting: Registered Nurse

## 2019-12-15 NOTE — Progress Notes (Signed)
No show. Pre-charting deleted. DO NOT BILL.

## 2019-12-21 ENCOUNTER — Encounter (HOSPITAL_BASED_OUTPATIENT_CLINIC_OR_DEPARTMENT_OTHER): Payer: Self-pay

## 2020-01-04 ENCOUNTER — Emergency Department (HOSPITAL_BASED_OUTPATIENT_CLINIC_OR_DEPARTMENT_OTHER)
Admission: EM | Admit: 2020-01-04 | Discharge: 2020-01-04 | Payer: 59 | Attending: Emergency Medicine | Admitting: Emergency Medicine

## 2020-01-04 DIAGNOSIS — J449 Chronic obstructive pulmonary disease, unspecified: Secondary | ICD-10-CM | POA: Insufficient documentation

## 2020-01-04 DIAGNOSIS — I10 Essential (primary) hypertension: Secondary | ICD-10-CM | POA: Insufficient documentation

## 2020-01-04 DIAGNOSIS — Z993 Dependence on wheelchair: Secondary | ICD-10-CM | POA: Insufficient documentation

## 2020-01-04 DIAGNOSIS — Z20822 Contact with and (suspected) exposure to covid-19: Secondary | ICD-10-CM | POA: Insufficient documentation

## 2020-01-04 DIAGNOSIS — Z79899 Other long term (current) drug therapy: Secondary | ICD-10-CM | POA: Insufficient documentation

## 2020-01-04 DIAGNOSIS — G629 Polyneuropathy, unspecified: Secondary | ICD-10-CM | POA: Insufficient documentation

## 2020-01-04 DIAGNOSIS — I5022 Chronic systolic (congestive) heart failure: Secondary | ICD-10-CM | POA: Insufficient documentation

## 2020-01-04 LAB — BASIC METABOLIC PANEL
Anion Gap: 11 (ref 4–12)
Calcium: 9 mg/dL (ref 8.9–10.2)
Carbon Dioxide, Total: 27 meq/L (ref 22–32)
Chloride: 103 meq/L (ref 98–108)
Creatinine: 1.21 mg/dL — ABNORMAL HIGH (ref 0.51–1.18)
Glucose: 76 mg/dL (ref 62–125)
Potassium: 3.6 meq/L (ref 3.6–5.2)
Sodium: 141 meq/L (ref 135–145)
Urea Nitrogen: 17 mg/dL (ref 8–21)
eGFR by CKD-EPI: >60 mL/min/{1.73_m2} (ref >59)

## 2020-01-04 LAB — HEPATIC FUNCTION PANEL
ALT (GPT): 15 U/L (ref 10–48)
AST (GOT): 21 U/L (ref 9–38)
Albumin: 4.1 g/dL (ref 3.5–5.2)
Alkaline Phosphatase (Total): 73 U/L (ref 39–139)
Bilirubin (Direct): 0.1 mg/dL (ref 0.0–0.3)
Bilirubin (Total): 0.5 mg/dL (ref 0.2–1.3)
Protein (Total): 7.5 g/dL (ref 6.0–8.2)

## 2020-01-04 LAB — SARS-COV-2 (COVID-19) QUALITATIVE RAPID PCR: COVID-19 Coronavirus Qual PCR Result: NOT DETECTED

## 2020-01-04 LAB — URINALYSIS WITH REFLEX CULTURE
Bacteria, URN: NONE SEEN
Bilirubin (Qual), URN: NEGATIVE
Epith Cells_Renal/Trans,URN: NEGATIVE /HPF
Epith Cells_Squamous, URN: NEGATIVE /LPF
Glucose Qual, URN: NEGATIVE mg/dL
Ketones, URN: NEGATIVE mg/dL
Leukocyte Esterase, URN: NEGATIVE
Nitrite, URN: NEGATIVE
Occult Blood, URN: NEGATIVE
Protein (Alb Semiquant), URN: NEGATIVE mg/dL
RBC, URN: NEGATIVE /HPF
Specific Gravity, URN: 1.006 g/mL (ref 1.006–1.027)
WBC, URN: NEGATIVE /HPF
pH, URN: 6.5 (ref 5.0–8.0)

## 2020-01-04 LAB — STANDARD DRUG SCREEN, URN
Acetaminophen Qualitative, URN: NEGATIVE
Alcohol (Ethyl), URN: 25 mg/dL — AB
Amphet/Methamphetamine Qual,URN: NEGATIVE
Barbiturate (Qual), URN: NEGATIVE
Benzodiazepines (Qual), URN: NEGATIVE
Cannabinoids (Qual), URN: NEGATIVE
Cocaine (Qual), URN: POSITIVE — AB
Methadone (Qual), URN: NEGATIVE
Opiates (Qual), URN: NEGATIVE
Phencyclidine (Qual), URN: NEGATIVE
Tricyclic Antidepressants, URN: NEGATIVE

## 2020-01-04 LAB — ALCOHOL (ETHYL): Alcohol (Ethyl): 29 mg/dL — AB

## 2020-01-04 LAB — CBC (HEMOGRAM)
Hematocrit: 42 % (ref 38–50)
Hemoglobin: 13.9 g/dL (ref 13.0–18.0)
MCH: 32.6 pg (ref 27.3–33.6)
MCHC: 33.1 g/dL (ref 32.2–36.5)
MCV: 99 fL — ABNORMAL HIGH (ref 81–98)
Platelet Count: 328 10*3/uL (ref 150–400)
RBC: 4.26 10*6/uL — ABNORMAL LOW (ref 4.40–5.60)
RDW-CV: 14.1 % (ref 11.6–14.4)
WBC: 6.55 10*3/uL (ref 4.30–10.00)

## 2020-01-05 ENCOUNTER — Other Ambulatory Visit (HOSPITAL_COMMUNITY): Payer: Self-pay | Admitting: Acute Care

## 2020-01-05 ENCOUNTER — Emergency Department (HOSPITAL_BASED_OUTPATIENT_CLINIC_OR_DEPARTMENT_OTHER)
Admission: EM | Admit: 2020-01-05 | Discharge: 2020-01-05 | Disposition: A | Discharge status: Other Acute Inpatient Hospital | Payer: 59 | Attending: Acute Care | Admitting: Acute Care

## 2020-01-05 ENCOUNTER — Other Ambulatory Visit: Payer: Self-pay | Admitting: Acute Care

## 2020-01-05 DIAGNOSIS — K0889 Other specified disorders of teeth and supporting structures: Secondary | ICD-10-CM | POA: Insufficient documentation

## 2020-01-05 DIAGNOSIS — Z79899 Other long term (current) drug therapy: Secondary | ICD-10-CM | POA: Insufficient documentation

## 2020-01-05 DIAGNOSIS — K047 Periapical abscess without sinus: Secondary | ICD-10-CM | POA: Insufficient documentation

## 2020-01-05 DIAGNOSIS — F17210 Nicotine dependence, cigarettes, uncomplicated: Secondary | ICD-10-CM | POA: Insufficient documentation

## 2020-01-05 DIAGNOSIS — N189 Chronic kidney disease, unspecified: Secondary | ICD-10-CM | POA: Insufficient documentation

## 2020-01-05 DIAGNOSIS — R220 Localized swelling, mass and lump, head: Secondary | ICD-10-CM | POA: Insufficient documentation

## 2020-01-05 LAB — CBC, DIFF
% Basophils: 1 %
% Eosinophils: 1 %
% Immature Granulocytes: 0 %
% Lymphocytes: 29 %
% Monocytes: 11 %
% Neutrophils: 58 %
% Nucleated RBC: 0 %
Absolute Eosinophil Count: 0.08 10*3/uL (ref 0.00–0.50)
Absolute Lymphocyte Count: 2.1 10*3/uL (ref 1.00–4.80)
Basophils: 0.04 10*3/uL (ref 0.00–0.20)
Hematocrit: 43 % (ref 38–50)
Hemoglobin: 14.2 g/dL (ref 13.0–18.0)
Immature Granulocytes: 0.02 10*3/uL (ref 0.00–0.05)
MCH: 32.6 pg (ref 27.3–33.6)
MCHC: 33.2 g/dL (ref 32.2–36.5)
MCV: 98 fL (ref 81–98)
Monocytes: 0.8 10*3/uL (ref 0.00–0.80)
Neutrophils: 4.15 10*3/uL (ref 1.80–7.00)
Nucleated RBC: 0 10*3/uL
Platelet Count: 302 10*3/uL (ref 150–400)
RBC: 4.36 10*6/uL — ABNORMAL LOW (ref 4.40–5.60)
RDW-CV: 13.8 % (ref 11.6–14.4)
WBC: 7.19 10*3/uL (ref 4.30–10.00)

## 2020-01-05 LAB — COMPREHENSIVE METABOLIC PANEL
ALT (GPT): 14 U/L (ref 10–48)
AST (GOT): 16 U/L (ref 9–38)
Albumin: 3.8 g/dL (ref 3.5–5.2)
Alkaline Phosphatase (Total): 72 U/L (ref 39–139)
Anion Gap: 10 (ref 4–12)
Bilirubin (Total): 0.4 mg/dL (ref 0.2–1.3)
Calcium: 8.8 mg/dL — ABNORMAL LOW (ref 8.9–10.2)
Carbon Dioxide, Total: 27 meq/L (ref 22–32)
Chloride: 102 meq/L (ref 98–108)
Creatinine: 1.23 mg/dL — ABNORMAL HIGH (ref 0.51–1.18)
Glucose: 109 mg/dL (ref 62–125)
Potassium: 3.5 meq/L — ABNORMAL LOW (ref 3.6–5.2)
Protein (Total): 7 g/dL (ref 6.0–8.2)
Sodium: 139 meq/L (ref 135–145)
Urea Nitrogen: 15 mg/dL (ref 8–21)
eGFR by CKD-EPI: >60 mL/min/{1.73_m2} (ref >59)

## 2020-01-05 MED ORDER — AMOXICILLIN-POT CLAVULANATE 875-125 MG OR TABS
ORAL_TABLET | ORAL | 0 refills | Status: AC
Start: 2020-01-05 — End: 2021-01-04

## 2020-01-05 MED ORDER — OXYCODONE HCL 5 MG OR TABS
ORAL_TABLET | ORAL | 0 refills | Status: AC
Start: 2020-01-05 — End: 2020-07-03

## 2020-01-05 MED ORDER — CHLORHEXIDINE GLUCONATE 0.12 % MT SOLN
OROMUCOSAL | 0 refills | Status: AC
Start: 2020-01-05 — End: 2021-01-04

## 2020-01-06 NOTE — Progress Notes (Signed)
DOCUMENTATION FOR NO SHOW VISIT. DO NOT BILL    HPI:  Eric Moon is a 54 year old male with a history of COPD, HTN, HFrEF (LVEF 35%), and polysubstance use disorder and alcohol use disorder. He moved to [facility] recently and presented to [facility] ER on 12/06/18 for volume overload and COPD exacerbation. He was found to have Type II NSTEMI in setting of HTN, meth and cocaine use, and medication non-adherence x 1 month. He was admitted from 12/06/18-18-20 and diuresed with IV furosemide and his COPD treated with prednisone, tiotropium, and albuterol. TTE during admission demonstrated global hypokinesis and LVEF of 35%. He was started on metoprolol succinate and lisinopril for GDMT and torsemide for maintenance diuretic. Notably, he also endorses SI during admission and was evaluated by psychiatry who found records from [place] of chronic SI with multiple ER visits for SI and auditory hallucination. He not determined to need inpatient psychiatric treatment. He also reported a history of heavy alcohol use but did not have evidence of withdrawal on CIWA.    RELEVANT DIAGNOSTICS:  Assessment of Etiology:  Ischemic work up: not complete  Nonischemic work up:  TSH: WNL 12/2019  SPEP /SFLC: not complete  HIV: not complete  cMRI: not complete  Tox screen: +meth, + cocaine, + MJ 12/2019  Comorbid screens  Fe studies: not complete  Sleep Apnea: not complete    12/08/2019 TTE  The left ventricle is mildly dilated. There is borderline concentric increase in the wall thickness of the left ventricle. Global left ventricular systolic function is moderately decreased. The calculated ejection fraction, as determined by the biplane method of disks, is 35%. There is moderate global hypokinesis of the left ventricle.  The right ventricle is normal size. RV function in the lower limit of normal.  The left atrium is mildly dilated. Right atrial size is normal.  Normal valve function.  Pulmonary artery systolic pressure could not be  estimated due to an insufficient tricuspid regurgitant jet. Unable to estimate right atrial pressure due to patient's refusal to sniff. Probably normal as IVC measures 1.3 cm.  A trace pericardial effusion is present.  The study was technically adequate. There is no comparison study available.      12/08/19 EKG  Normal sinus rhythm with LVH and repolarization abnormality, prolonged QT

## 2020-01-08 ENCOUNTER — Emergency Department (HOSPITAL_BASED_OUTPATIENT_CLINIC_OR_DEPARTMENT_OTHER)
Admission: EM | Admit: 2020-01-08 | Discharge: 2020-01-08 | Disposition: A | Discharge status: Home / Self Care | Payer: 59 | Attending: Family | Admitting: Family

## 2020-01-08 DIAGNOSIS — Z79899 Other long term (current) drug therapy: Secondary | ICD-10-CM | POA: Insufficient documentation

## 2020-01-08 DIAGNOSIS — G629 Polyneuropathy, unspecified: Secondary | ICD-10-CM | POA: Insufficient documentation

## 2020-01-08 DIAGNOSIS — K0889 Other specified disorders of teeth and supporting structures: Secondary | ICD-10-CM | POA: Insufficient documentation

## 2020-01-08 DIAGNOSIS — Z98818 Other dental procedure status: Secondary | ICD-10-CM | POA: Insufficient documentation

## 2020-01-25 ENCOUNTER — Emergency Department (HOSPITAL_BASED_OUTPATIENT_CLINIC_OR_DEPARTMENT_OTHER)
Admission: EM | Admit: 2020-01-25 | Discharge: 2020-01-25 | Disposition: A | Discharge status: Home / Self Care | Payer: 59 | Attending: Emergency Medicine | Admitting: Emergency Medicine

## 2020-01-25 ENCOUNTER — Other Ambulatory Visit (HOSPITAL_COMMUNITY): Payer: Self-pay | Admitting: Student in an Organized Health Care Education/Training Program

## 2020-01-25 ENCOUNTER — Other Ambulatory Visit: Payer: Self-pay | Admitting: Student in an Organized Health Care Education/Training Program

## 2020-01-25 ENCOUNTER — Other Ambulatory Visit: Payer: Self-pay

## 2020-01-25 DIAGNOSIS — Z760 Encounter for issue of repeat prescription: Secondary | ICD-10-CM | POA: Insufficient documentation

## 2020-01-25 DIAGNOSIS — I4581 Long QT syndrome: Secondary | ICD-10-CM

## 2020-01-25 DIAGNOSIS — R918 Other nonspecific abnormal finding of lung field: Secondary | ICD-10-CM | POA: Insufficient documentation

## 2020-01-25 DIAGNOSIS — F17210 Nicotine dependence, cigarettes, uncomplicated: Secondary | ICD-10-CM | POA: Insufficient documentation

## 2020-01-25 DIAGNOSIS — R0602 Shortness of breath: Secondary | ICD-10-CM | POA: Insufficient documentation

## 2020-01-25 DIAGNOSIS — I5022 Chronic systolic (congestive) heart failure: Secondary | ICD-10-CM | POA: Insufficient documentation

## 2020-01-25 MED ORDER — TORSEMIDE 20 MG OR TABS
ORAL_TABLET | ORAL | 0 refills | Status: AC
Start: 2020-01-25 — End: 2021-01-24

## 2020-01-25 MED ORDER — LISINOPRIL 5 MG OR TABS
ORAL_TABLET | ORAL | 0 refills | Status: AC
Start: 2020-01-25 — End: 2021-01-24

## 2020-01-25 MED ORDER — ASPIRIN 81 MG OR TBEC
DELAYED_RELEASE_TABLET | ORAL | 0 refills | Status: AC
Start: 2020-01-25 — End: 2021-01-24

## 2020-01-25 MED ORDER — METOPROLOL SUCCINATE ER 25 MG OR TB24
EXTENDED_RELEASE_TABLET | ORAL | 0 refills | Status: AC
Start: 2020-01-25 — End: 2021-01-24

## 2020-01-25 MED ORDER — ATORVASTATIN CALCIUM 40 MG OR TABS
ORAL_TABLET | ORAL | 0 refills | Status: AC
Start: 2020-01-25 — End: 2021-01-24

## 2020-01-25 MED ORDER — ALBUTEROL SULFATE HFA 108 (90 BASE) MCG/ACT IN AERS
INHALATION_SPRAY | RESPIRATORY_TRACT | 0 refills | Status: AC
Start: 2020-01-25 — End: 2021-01-24

## 2020-01-25 MED ORDER — TIOTROPIUM BROMIDE MONOHYDRATE 18 MCG IN CAPS
ORAL_CAPSULE | RESPIRATORY_TRACT | 0 refills | Status: AC
Start: 2020-01-25 — End: 2021-01-24

## 2020-01-27 LAB — EKG 12 LEAD
Atrial Rate: 87 {beats}/min
Diagnosis: NORMAL
P Axis: 74 degrees
P-R Interval: 170 ms
Q-T Interval: 414 ms
QRS Duration: 94 ms
QTC Calculation: 498 ms
R Axis: 5 degrees
T Axis: 173 degrees
Ventricular Rate: 87 {beats}/min

## 2020-02-01 ENCOUNTER — Ambulatory Visit (HOSPITAL_BASED_OUTPATIENT_CLINIC_OR_DEPARTMENT_OTHER): Payer: 59

## 2020-03-19 DIAGNOSIS — F1210 Cannabis abuse, uncomplicated: Secondary | ICD-10-CM | POA: Insufficient documentation

## 2020-06-03 DIAGNOSIS — K219 Gastro-esophageal reflux disease without esophagitis: Secondary | ICD-10-CM | POA: Insufficient documentation

## 2020-07-07 ENCOUNTER — Emergency Department (HOSPITAL_COMMUNITY)
Admission: EM | Admit: 2020-07-07 | Discharge: 2020-07-07 | Payer: Self-pay | Attending: Emergency Medicine | Admitting: Emergency Medicine

## 2020-07-07 ENCOUNTER — Encounter (HOSPITAL_COMMUNITY): Payer: Self-pay | Admitting: Emergency Medicine

## 2020-07-07 ENCOUNTER — Other Ambulatory Visit: Payer: Self-pay

## 2020-07-07 DIAGNOSIS — F329 Major depressive disorder, single episode, unspecified: Secondary | ICD-10-CM | POA: Insufficient documentation

## 2020-07-07 DIAGNOSIS — I10 Essential (primary) hypertension: Secondary | ICD-10-CM | POA: Insufficient documentation

## 2020-07-07 DIAGNOSIS — J45909 Unspecified asthma, uncomplicated: Secondary | ICD-10-CM | POA: Insufficient documentation

## 2020-07-07 DIAGNOSIS — R45851 Suicidal ideations: Secondary | ICD-10-CM | POA: Insufficient documentation

## 2020-07-07 DIAGNOSIS — F1410 Cocaine abuse, uncomplicated: Secondary | ICD-10-CM | POA: Insufficient documentation

## 2020-07-07 DIAGNOSIS — J449 Chronic obstructive pulmonary disease, unspecified: Secondary | ICD-10-CM | POA: Insufficient documentation

## 2020-07-07 DIAGNOSIS — Z91040 Latex allergy status: Secondary | ICD-10-CM | POA: Insufficient documentation

## 2020-07-07 DIAGNOSIS — F17210 Nicotine dependence, cigarettes, uncomplicated: Secondary | ICD-10-CM | POA: Insufficient documentation

## 2020-07-07 DIAGNOSIS — Z20822 Contact with and (suspected) exposure to covid-19: Secondary | ICD-10-CM | POA: Insufficient documentation

## 2020-07-07 DIAGNOSIS — F1590 Other stimulant use, unspecified, uncomplicated: Secondary | ICD-10-CM | POA: Insufficient documentation

## 2020-07-07 DIAGNOSIS — F32A Depression, unspecified: Secondary | ICD-10-CM

## 2020-07-07 LAB — CBC WITH DIFFERENTIAL/PLATELET
Abs Immature Granulocytes: 0.01 10*3/uL (ref 0.00–0.07)
Basophils Absolute: 0 10*3/uL (ref 0.0–0.1)
Basophils Relative: 1 %
Eosinophils Absolute: 0.1 10*3/uL (ref 0.0–0.5)
Eosinophils Relative: 1 %
HCT: 40.7 % (ref 39.0–52.0)
Hemoglobin: 13.9 g/dL (ref 13.0–17.0)
Immature Granulocytes: 0 %
Lymphocytes Relative: 51 %
Lymphs Abs: 3 10*3/uL (ref 0.7–4.0)
MCH: 32.8 pg (ref 26.0–34.0)
MCHC: 34.2 g/dL (ref 30.0–36.0)
MCV: 96 fL (ref 80.0–100.0)
Monocytes Absolute: 0.7 10*3/uL (ref 0.1–1.0)
Monocytes Relative: 11 %
Neutro Abs: 2.2 10*3/uL (ref 1.7–7.7)
Neutrophils Relative %: 36 %
Platelets: 256 10*3/uL (ref 150–400)
RBC: 4.24 MIL/uL (ref 4.22–5.81)
RDW: 13.7 % (ref 11.5–15.5)
WBC: 5.9 10*3/uL (ref 4.0–10.5)
nRBC: 0 % (ref 0.0–0.2)

## 2020-07-07 LAB — RAPID URINE DRUG SCREEN, HOSP PERFORMED
Amphetamines: NOT DETECTED
Barbiturates: NOT DETECTED
Benzodiazepines: NOT DETECTED
Cocaine: POSITIVE — AB
Opiates: NOT DETECTED
Tetrahydrocannabinol: NOT DETECTED

## 2020-07-07 LAB — COMPREHENSIVE METABOLIC PANEL
ALT: 30 U/L (ref 0–44)
AST: 31 U/L (ref 15–41)
Albumin: 4.1 g/dL (ref 3.5–5.0)
Alkaline Phosphatase: 62 U/L (ref 38–126)
Anion gap: 12 (ref 5–15)
BUN: 17 mg/dL (ref 6–20)
CO2: 22 mmol/L (ref 22–32)
Calcium: 9.2 mg/dL (ref 8.9–10.3)
Chloride: 103 mmol/L (ref 98–111)
Creatinine, Ser: 1.09 mg/dL (ref 0.61–1.24)
GFR calc Af Amer: >60 mL/min (ref >60)
GFR calc non Af Amer: >60 mL/min (ref >60)
Glucose, Bld: 85 mg/dL (ref 70–99)
Potassium: 4 mmol/L (ref 3.5–5.1)
Sodium: 137 mmol/L (ref 135–145)
Total Bilirubin: 0.7 mg/dL (ref 0.3–1.2)
Total Protein: 7.5 g/dL (ref 6.5–8.1)

## 2020-07-07 LAB — ETHANOL: Alcohol, Ethyl (B): <10 mg/dL (ref <10)

## 2020-07-07 LAB — SARS CORONAVIRUS 2 BY RT PCR (HOSPITAL ORDER, PERFORMED IN ~~LOC~~ HOSPITAL LAB): SARS Coronavirus 2: NEGATIVE

## 2020-07-07 MED ORDER — LISINOPRIL 20 MG PO TABS
20.0000 mg | ORAL_TABLET | Freq: Once | ORAL | Status: AC
  Administered 2020-07-07: 20 mg via ORAL
  Filled 2020-07-07: qty 1

## 2020-07-07 MED ORDER — IBUPROFEN 400 MG PO TABS: 600.0000 mg | ORAL_TABLET | Freq: Three times a day (TID) | ORAL | Status: DC | PRN

## 2020-07-07 NOTE — Consult Note (Addendum)
Patient to transfer to the [facility] as soon as transportation available.  Nanine Means, PMHNP

## 2020-07-07 NOTE — ED Notes (Signed)
TTS being done now 

## 2020-07-07 NOTE — BH Assessment (Signed)
Tele Assessment Note   Patient Name: [name] MRN: [id number] Referring Physician:  Alvester Chou, MD Location of Patient: [facility] Location of Provider: [facility]  [name] is an 54 y.o. male present to [facility] with a history significant for schizoaffective disorder, bipolar type, although possibly substance-induced, as well as polysubstance use disorder including cocaine and alcohol, who presented to the ED for SI with plan to overdose. Per chart review, patient has presented to [facility] ED and [facility] ED muliptle times in the past 48 hours, having been evaluated and discharged from [facility] ED on both 8/3 and 8/4, and [facility] 07/06/2020 after presenting with similar complaints. Patient was also seen at [facility] yesterday in [place] and was discharged from there as well. During assessment patient complains of suicidal ideations triggered by auditory hallucinations with a plan to overdose. Patient also endorses stable appetite and good sleep but when asked remainder of questions for criteria of depressive disorder, patient simply responds yes to all. When the discussing if he's not recommended for discharge patient became withdrawn, agitated, and irritable. He states he is just going to leave out and take all the pills in his bag. When asked what pills does he have in his bag patient stated, "I don't know, High Blood Pressure pills." Per chart review  the patient's presentation is mainly motivated by secondary gain behavior. He has a documented history of malingering and reports that he has not followed up with outpatient mental health services despite numerous inpatient hospitalizations that have resulted in little benefit. Patient is homeless and states he has no contact with his family.     Diagnosis: Schiz-affective disorder    Past Medical History:  Diagnosis Date   Asthma    Bipolar 1 disorder (HCC)    CHF (congestive heart failure) (HCC)    Depression     Homelessness    Hypertension    QT prolongation    Schizoaffective disorder (HCC)     Past Medical History:  Past Medical History:  Diagnosis Date   Asthma    Bipolar 1 disorder (HCC)    CHF (congestive heart failure) (HCC)    Depression    Homelessness    Hypertension    QT prolongation    Schizoaffective disorder (HCC)     History reviewed. No pertinent surgical history.  Family History:  Family History  Problem Relation Age of Onset   Drug abuse Mother    Suicidality Mother     Social History:  reports that he has been smoking cigarettes. He has smoked for the past 30.00 years. He has never used smokeless tobacco. He reports current alcohol use. He reports current drug use. Drugs: Cocaine and Marijuana.  Additional Social History:  Alcohol / Drug Use Pain Medications: See MAR Prescriptions: See MAR Over the Counter: See MAR History of alcohol / drug use?: Yes Longest period of sobriety (when/how long): unknown Withdrawal Symptoms:  (denied withdrawal symptoms) Substance #1 Name of Substance 1: cocaine 1 - Age of First Use: 20 1 - Amount (size/oz): unknown 1 - Frequency: "as much as I can" 1 - Duration: ongoing 1 - Last Use / Amount: 07/03/2020  CIWA: CIWA-Ar BP: (!) 145/78 Pulse Rate: (!) 48 COWS:    Allergies:  Allergies  Allergen Reactions   Acetaminophen Hives, Nausea And Vomiting and Rash    Pt overdosed on bottle of APAP 05/06/16    Latex Rash    Home Medications: (Not in a hospital admission)   OB/GYN Status:  No LMP for male patient.  General Assessment Data Location of Assessment: [facility] ED TTS Assessment: In system Is this a Tele or Face-to-Face Assessment?: Tele Assessment Is this an Initial Assessment or a Re-assessment for this encounter?: Initial Assessment Patient Accompanied by:: N/A Language Other than English: No Living Arrangements: Homeless/Shelter What gender do you identify as?: Male Date Telepsych consult ordered  in [facility]: 07/07/20 Time Telepsych consult ordered in [facility]: 1340 Marital status: Single Living Arrangements: Other (Comment) (homeless) Can pt return to current living arrangement?: Yes Admission Status: Voluntary Is patient capable of signing voluntary admission?: Yes Referral Source: Self/Family/Friend Insurance type: Medicaid      Crisis Care Plan Living Arrangements: Other (Comment) (homeless) Legal Guardian: Other: (self) Name of Psychiatrist:  (none reported ) Name of Therapist:  (none reported )  Education Status Is patient currently in school?: No Is the patient employed, unemployed or receiving disability?: Unemployed  Risk to self with the past 6 months Suicidal Ideation: Yes-Currently Present Has patient been a risk to self within the past 6 months prior to admission? : No Suicidal Intent: No Has patient had any suicidal intent within the past 6 months prior to admission? : No Is patient at risk for suicide?: No Suicidal Plan?: Yes-Currently Present (report plan to overdose) Has patient had any suicidal plan within the past 6 months prior to admission? : Yes Specify Current Suicidal Plan: several plans per chart review Access to Means: Yes (report has many pills in his bag ) Specify Access to Suicidal Means: overdose What has been your use of drugs/alcohol within the last 12 months?: cocaine Previous Attempts/Gestures: Yes How many times?:  ("report many" ) Other Self Harm Risks: substance abuse  Triggers for Past Attempts: Unknown ("patient denied specific trigger" ) Intentional Self Injurious Behavior: Damaging (substance use ) Comment - Self Injurious Behavior: substance use 'cocaine abuse' Family Suicide History: Yes (mother) Recent stressful life event(s): Other (Comment) (homeless ) Persecutory voices/beliefs?: No Depression: Yes Depression Symptoms: Feeling worthless/self pity Substance abuse history and/or treatment for substance abuse?: Yes Suicide  prevention information given to non-admitted patients: Not applicable  Risk to Others within the past 6 months Homicidal Ideation: No Does patient have any lifetime risk of violence toward others beyond the six months prior to admission? : No Thoughts of Harm to Others: No Current Homicidal Intent: No Current Homicidal Plan: No Access to Homicidal Means: No Identified Victim: n/a History of harm to others?: No Assessment of Violence: None Noted Violent Behavior Description: none reported Does patient have access to weapons?: No Criminal Charges Pending?: No Does patient have a court date: No Is patient on probation?: No  Psychosis Hallucinations: Auditory Delusions: None noted  Mental Status Report Appearance/Hygiene: In scrubs Eye Contact: Good Motor Activity: Freedom of movement Speech: Logical/coherent Level of Consciousness: Alert Mood: Other (Comment) (agitated ) Affect: Irritable Anxiety Level: None Thought Processes: Coherent, Relevant Judgement: Impaired Orientation: Person, Place, Time, Situation Obsessive Compulsive Thoughts/Behaviors: None  Cognitive Functioning Concentration: Normal Memory: Recent Intact, Remote Intact Is patient IDD: No Insight: Poor Impulse Control: Fair Appetite: Good Sleep: No Change Total Hours of Sleep: 6 Vegetative Symptoms: None  ADLScreening [facility] Assessment Services) Patient's cognitive ability adequate to safely complete daily activities?: Yes Patient able to express need for assistance with ADLs?: Yes Independently performs ADLs?: Yes (appropriate for developmental age)  Prior Inpatient Therapy Prior Inpatient Therapy: Yes Reason for Treatment: variety  (report history of inpatient )  Prior Outpatient Therapy Prior Outpatient Therapy: No Does patient  have an ACCT team?: No Does patient have Intensive In-House Services?  : No Does patient have [facility] services? : No Does patient have P4CC services?: No  ADL  Screening (condition at time of admission) Patient's cognitive ability adequate to safely complete daily activities?: Yes Is the patient deaf or have difficulty hearing?: No Does the patient have difficulty seeing, even when wearing glasses/contacts?: No Does the patient have difficulty concentrating, remembering, or making decisions?: No Patient able to express need for assistance with ADLs?: Yes Does the patient have difficulty dressing or bathing?: No Independently performs ADLs?: Yes (appropriate for developmental age) Does the patient have difficulty walking or climbing stairs?: No       Abuse/Neglect Assessment (Assessment to be complete while patient is alone) Abuse/Neglect Assessment Can Be Completed: Yes Physical Abuse: Denies Verbal Abuse: Denies Sexual Abuse: Denies Exploitation of patient/patient's resources: Denies Self-Neglect: Denies     Merchant navy officer (For Healthcare) Does Patient Have a Medical Advance Directive?: No Would patient like information on creating a medical advance directive?: No - Patient declined          Disposition:  Disposition Initial Assessment Completed for this Encounter: Yes Disposition of Patient: Transfer Nanine Means, DNP, transport pt to [facility] )  This service was provided via telemedicine using a 2-way, interactive audio and Immunologist.  Names of all persons participating in this telemedicine service and their role in this encounter. Name: [name] Role: patient  Name:  Carvel Getting Role:  TTS assessor  Name:  Role:   Name:  Role:     Dian Situ 07/07/2020 3:31 PM

## 2020-07-07 NOTE — ED Provider Notes (Signed)
[facility] EMERGENCY DEPARTMENT Provider Note   CSN: [id number] Arrival date & time: 07/07/20  4782     History No chief complaint on file.   [name] is a 54 y.o. male.  HPI    54 year old male history of bipolar disorder, schizoaffective disorder, CHF, asthma, homelessness presents today complaining of depression and suicidality.  He states he has a handful of pills which he would take.  He reports that this feels with the dose that he takes for his health and mental health.  He is unable to give me any more specifics.  He does not state that he took any of these recently.  He reports that he came to the hospital because the police state the [employer] [place] across the street from where he lives.  He states that he went up and talk to one of them and he trusted.  Advised him to come to the ED and had her transported.  He reports that he has been at [facility] earlier this week and was discharged home.  Reports that he has been suicidal since prior to that. Past Medical History:  Diagnosis Date  . Asthma   . Bipolar 1 disorder (HCC)   . CHF (congestive heart failure) (HCC)   . Depression   . Homelessness   . Hypertension   . QT prolongation   . Schizoaffective disorder [facility])     Patient Active Problem List   Diagnosis Date Noted  . Schizoaffective disorder (HCC) 06/24/2019  . Shortness of breath   . CHF exacerbation (HCC) 06/21/2019  . QT prolongation 05/25/2019  . Cocaine abuse (HCC)   . Chest pain 05/24/2019  . Elevated troponin   . Homelessness   . Suicidal ideations   . Acute on chronic systolic heart failure (HCC) 04/18/2019  . Bilateral foot pain 04/05/2019  . Acute on chronic clinical systolic heart failure (HCC) 04/04/2019  . Housing situation unstable 03/28/2019  . AKI (acute kidney injury) (HCC)   . Bilateral lower extremity edema   . Acute on chronic HFrEF (heart failure with reduced ejection fraction) (HCC) 03/27/2019  . COPD exacerbation  (HCC)   . Acute exacerbation of CHF (congestive heart failure) (HCC) 03/04/2019  . Asthma 12/26/2016  . Tobacco use disorder 12/26/2016  . Schizophrenia (HCC) 12/26/2016  . Cocaine abuse with cocaine-induced mood disorder (HCC) 09/06/2016  . Alcohol use disorder, mild, abuse 07/31/2016  . Substance induced mood disorder (HCC) 07/30/2016  . Schizoaffective disorder, bipolar type (HCC) 05/31/2016  . Cocaine use disorder, severe, dependence (HCC) 05/31/2016  . Essential hypertension 05/31/2016    History reviewed. No pertinent surgical history.     Family History  Problem Relation Age of Onset  . Drug abuse Mother   . Suicidality Mother     Social History   Tobacco Use  . Smoking status: Current Every Day Smoker    Years: 30.00    Types: Cigarettes  . Smokeless tobacco: Never Used  Vaping Use  . Vaping Use: Never used  Substance Use Topics  . Alcohol use: Yes    Comment: infrequently  . Drug use: Yes    Types: Cocaine, Marijuana    Home Medications Prior to Admission medications   Medication Sig Start Date End Date Taking? Authorizing Provider  albuterol (PROVENTIL HFA;VENTOLIN HFA) 108 (90 Base) MCG/ACT inhaler Inhale 1-2 puffs into the lungs every 4 (four) hours as needed for shortness of breath. Patient not taking: Reported on 05/24/2019 08/22/18   Dione Booze, MD  ARIPiprazole (ABILIFY) 2 MG tablet Take 1 tablet (2 mg total) by mouth at bedtime. Patient not taking: Reported on 06/21/2019 05/26/19   Mirian Mo, MD    Allergies    Acetaminophen and Latex  Review of Systems   Review of Systems  All other systems reviewed and are negative.   Physical Exam Updated Vital Signs BP (!) 184/127 (BP Location: Right Arm)   Pulse (!) 52   Temp 97.9 F (36.6 C) (Oral)   Resp 19   Ht 1.753 m (5\' 9" )   Wt 90.7 kg   SpO2 99%   BMI 29.53 kg/m   Physical Exam Vitals and nursing note reviewed.  Constitutional:      General: He is not in acute distress.     Appearance: Normal appearance.  HENT:     Head: Normocephalic and atraumatic.     Right Ear: External ear normal.     Left Ear: External ear normal.     Nose: Nose normal.     Mouth/Throat:     Pharynx: Oropharynx is clear.  Eyes:     Extraocular Movements: Extraocular movements intact.     Pupils: Pupils are equal, round, and reactive to light.  Cardiovascular:     Rate and Rhythm: Normal rate and regular rhythm.     Pulses: Normal pulses.     Heart sounds: Normal heart sounds.  Pulmonary:     Effort: Pulmonary effort is normal.     Breath sounds: Normal breath sounds.  Abdominal:     Palpations: Abdomen is soft.  Musculoskeletal:        General: Normal range of motion.     Cervical back: Normal range of motion.  Skin:    General: Skin is warm.     Capillary Refill: Capillary refill takes less than 2 seconds.  Neurological:     General: No focal deficit present.     Mental Status: He is alert and oriented to person, place, and time. Mental status is at baseline.  Psychiatric:        Attention and Perception: Attention normal.        Mood and Affect: Affect is labile and angry.        Speech: Speech normal.        Behavior: Behavior is agitated and aggressive.        Thought Content: Thought content includes suicidal ideation.        Cognition and Memory: Cognition normal.        Judgment: Judgment is impulsive and inappropriate.     ED Results / Procedures / Treatments   Labs (all labs ordered are listed, but only abnormal results are displayed) Labs Reviewed  RAPID URINE DRUG SCREEN, HOSP PERFORMED - Abnormal; Notable for the following components:      Result Value   Cocaine POSITIVE (*)    All other components within normal limits  SARS CORONAVIRUS 2 BY RT PCR (HOSPITAL ORDER, PERFORMED IN [facility])  COMPREHENSIVE METABOLIC PANEL  ETHANOL  CBC WITH DIFFERENTIAL/PLATELET    EKG None  Radiology No results found.  Procedures Procedures  (including critical care time)  Medications Ordered in ED Medications - No data to display  ED Course  I have reviewed the triage vital signs and the nursing notes.  Pertinent labs & imaging results that were available during my care of the patient were reviewed by me and considered in my medical decision making (see chart for details).  MDM Rules/Calculators/A&P                          Patient presents today complaining of depression and suicidal ideation.  He has a known history of hypertension and has not taken his blood pressure medications.  Patient has urine drug screen positive for cocaine.  Otherwise, labs are within normal limits.  Awaiting TTS currently. 1- depression- tts reccomendations pending 2- hypertension- patient noncompliant with meds.  Lisinopril given here per home meds 3- cocaine abuse  [facility]  Final Clinical Impression(s) / ED Diagnoses Final diagnoses:  Depression, unspecified depression type  Suicidal ideation  Cocaine abuse [facility])    Rx / DC Orders ED Discharge Orders    None       Margarita Grizzle, MD 07/07/20 1526

## 2020-07-07 NOTE — ED Triage Notes (Signed)
Pt here with SI thoughts , pt arrives in scrubs ,and flip flops , pt has been in [place] [facility] for same multiple times

## 2020-07-07 NOTE — ED Provider Notes (Signed)
Sign out from EDP Dr Rosalia Hammers at 320 pm.  54 yo male w/ bipolar disorder, cocaine positive in UDS, pending TTS consult for SI.  Hx of homelesness  He is not under IVC  On exam he is eating a meal, appears comfortable in the bed.  Received lisinopril 20 mg for HTN   Terald Sleeper, MD 07/08/20 [phone]

## 2020-07-07 NOTE — ED Notes (Signed)
Pt refused EKG notified Dr. Rosalia Hammers

## 2020-07-07 NOTE — ED Provider Notes (Addendum)
Patient arrived on the unit and was joking and very jovial, requesting to interact with previous staff members. He was advised that he was unable to talk with that particular staff member as he was not present and had been assessed. Patient mood immediately changed and he became guarded and irritable. Writer attempted to proceed with his assessment in an attempt to complete his HPI. He stated " you dont have my paperwork or my chart?" He was not forthcoming with information, he presented with a labile mood, blunt and restricted affect during the evaluation. Patient then stated "fuck it I aint gotta be here. Can I go?" Patient was no longer willing to come into the [facility] for observation. Patient with multiple presentations to our local ER and well known to our service (17 combined ER visits and admissions in 6 months).  During the brief encounter he did not appear to have symptoms of depression, and in his chart review he was very vague with the previous provider. Per chart review he has presented to [facility] and [facility] multiple times in the past 2 days for similar presentations.   Per chart review he did not meet inpatient criteria and did not meet criteria to be IVC. Patient has a history of malingering, and has requested to be released therefore he was discharged.

## 2020-07-07 NOTE — ED Notes (Signed)
Pt left with [facility].  2 belongings bag given to [employer] driver.

## 2020-07-07 NOTE — ED Notes (Signed)
Pt ready for transport to [facility].

## 2020-07-07 NOTE — ED Notes (Signed)
[facility] called 

## 2020-07-15 ENCOUNTER — Emergency Department (HOSPITAL_COMMUNITY)
Admission: EM | Admit: 2020-07-15 | Discharge: 2020-07-16 | Disposition: A | Discharge status: Home / Self Care | Payer: Self-pay | Attending: Emergency Medicine | Admitting: Emergency Medicine

## 2020-07-15 ENCOUNTER — Other Ambulatory Visit: Payer: Self-pay

## 2020-07-15 ENCOUNTER — Encounter (HOSPITAL_COMMUNITY): Payer: Self-pay | Admitting: Emergency Medicine

## 2020-07-15 DIAGNOSIS — J449 Chronic obstructive pulmonary disease, unspecified: Secondary | ICD-10-CM | POA: Insufficient documentation

## 2020-07-15 DIAGNOSIS — R0602 Shortness of breath: Secondary | ICD-10-CM | POA: Insufficient documentation

## 2020-07-15 DIAGNOSIS — Z5321 Procedure and treatment not carried out due to patient leaving prior to being seen by health care provider: Secondary | ICD-10-CM | POA: Insufficient documentation

## 2020-07-15 DIAGNOSIS — I509 Heart failure, unspecified: Secondary | ICD-10-CM | POA: Insufficient documentation

## 2020-07-15 NOTE — ED Triage Notes (Signed)
Pt BIB [facility], c/o shortness of breath with exertion x 3 days. Hx CHF and COPD, out of medications.

## 2020-07-16 ENCOUNTER — Emergency Department (HOSPITAL_COMMUNITY): Payer: Medicaid Other

## 2020-07-16 ENCOUNTER — Encounter (HOSPITAL_COMMUNITY): Payer: Self-pay | Admitting: Emergency Medicine

## 2020-07-16 ENCOUNTER — Other Ambulatory Visit: Payer: Self-pay

## 2020-07-16 ENCOUNTER — Emergency Department (HOSPITAL_COMMUNITY)
Admission: EM | Admit: 2020-07-16 | Discharge: 2020-07-16 | Disposition: A | Discharge status: Home / Self Care | Payer: Medicaid Other | Attending: Emergency Medicine | Admitting: Emergency Medicine

## 2020-07-16 DIAGNOSIS — J45909 Unspecified asthma, uncomplicated: Secondary | ICD-10-CM | POA: Diagnosis not present

## 2020-07-16 DIAGNOSIS — J449 Chronic obstructive pulmonary disease, unspecified: Secondary | ICD-10-CM | POA: Insufficient documentation

## 2020-07-16 DIAGNOSIS — I509 Heart failure, unspecified: Secondary | ICD-10-CM

## 2020-07-16 DIAGNOSIS — I5023 Acute on chronic systolic (congestive) heart failure: Secondary | ICD-10-CM | POA: Insufficient documentation

## 2020-07-16 DIAGNOSIS — F17210 Nicotine dependence, cigarettes, uncomplicated: Secondary | ICD-10-CM | POA: Insufficient documentation

## 2020-07-16 DIAGNOSIS — Z91040 Latex allergy status: Secondary | ICD-10-CM | POA: Diagnosis not present

## 2020-07-16 DIAGNOSIS — I10 Essential (primary) hypertension: Secondary | ICD-10-CM

## 2020-07-16 DIAGNOSIS — I110 Hypertensive heart disease with heart failure: Secondary | ICD-10-CM | POA: Insufficient documentation

## 2020-07-16 DIAGNOSIS — M79674 Pain in right toe(s): Secondary | ICD-10-CM | POA: Insufficient documentation

## 2020-07-16 DIAGNOSIS — F1590 Other stimulant use, unspecified, uncomplicated: Secondary | ICD-10-CM | POA: Insufficient documentation

## 2020-07-16 DIAGNOSIS — R0602 Shortness of breath: Secondary | ICD-10-CM | POA: Diagnosis present

## 2020-07-16 DIAGNOSIS — Z79899 Other long term (current) drug therapy: Secondary | ICD-10-CM | POA: Diagnosis not present

## 2020-07-16 LAB — COMPREHENSIVE METABOLIC PANEL
ALT: 24 U/L (ref 0–44)
AST: 27 U/L (ref 15–41)
Albumin: 4.5 g/dL (ref 3.5–5.0)
Alkaline Phosphatase: 68 U/L (ref 38–126)
Anion gap: 14 (ref 5–15)
BUN: 18 mg/dL (ref 6–20)
CO2: 20 mmol/L — ABNORMAL LOW (ref 22–32)
Calcium: 9.6 mg/dL (ref 8.9–10.3)
Chloride: 105 mmol/L (ref 98–111)
Creatinine, Ser: 1.05 mg/dL (ref 0.61–1.24)
GFR calc Af Amer: >60 mL/min (ref >60)
GFR calc non Af Amer: >60 mL/min (ref >60)
Glucose, Bld: 123 mg/dL — ABNORMAL HIGH (ref 70–99)
Potassium: 4.3 mmol/L (ref 3.5–5.1)
Sodium: 139 mmol/L (ref 135–145)
Total Bilirubin: 0.8 mg/dL (ref 0.3–1.2)
Total Protein: 7.9 g/dL (ref 6.5–8.1)

## 2020-07-16 LAB — URINALYSIS, ROUTINE W REFLEX MICROSCOPIC
Bacteria, UA: NONE SEEN
Bilirubin Urine: NEGATIVE
Glucose, UA: NEGATIVE mg/dL
Ketones, ur: NEGATIVE mg/dL
Leukocytes,Ua: NEGATIVE
Nitrite: NEGATIVE
Protein, ur: NEGATIVE mg/dL
Specific Gravity, Urine: 1.012 (ref 1.005–1.030)
pH: 5 (ref 5.0–8.0)

## 2020-07-16 LAB — CBC WITH DIFFERENTIAL/PLATELET
Abs Immature Granulocytes: 0.02 10*3/uL (ref 0.00–0.07)
Basophils Absolute: 0 10*3/uL (ref 0.0–0.1)
Basophils Relative: 0 %
Eosinophils Absolute: 0 10*3/uL (ref 0.0–0.5)
Eosinophils Relative: 0 %
HCT: 43.7 % (ref 39.0–52.0)
Hemoglobin: 14.9 g/dL (ref 13.0–17.0)
Immature Granulocytes: 0 %
Lymphocytes Relative: 14 %
Lymphs Abs: 1.2 10*3/uL (ref 0.7–4.0)
MCH: 33.2 pg (ref 26.0–34.0)
MCHC: 34.1 g/dL (ref 30.0–36.0)
MCV: 97.3 fL (ref 80.0–100.0)
Monocytes Absolute: 0.2 10*3/uL (ref 0.1–1.0)
Monocytes Relative: 2 %
Neutro Abs: 7.2 10*3/uL (ref 1.7–7.7)
Neutrophils Relative %: 84 %
Platelets: 269 10*3/uL (ref 150–400)
RBC: 4.49 MIL/uL (ref 4.22–5.81)
RDW: 14.7 % (ref 11.5–15.5)
WBC: 8.7 10*3/uL (ref 4.0–10.5)
nRBC: 0 % (ref 0.0–0.2)

## 2020-07-16 IMAGING — CR DG CHEST 2V
2 series · 2 of 2 positions shown · non-contrast
Comparison: [DATE]

CLINICAL DATA: Shortness of breath

EXAM:
CHEST - 2 VIEW

[w chest pa]
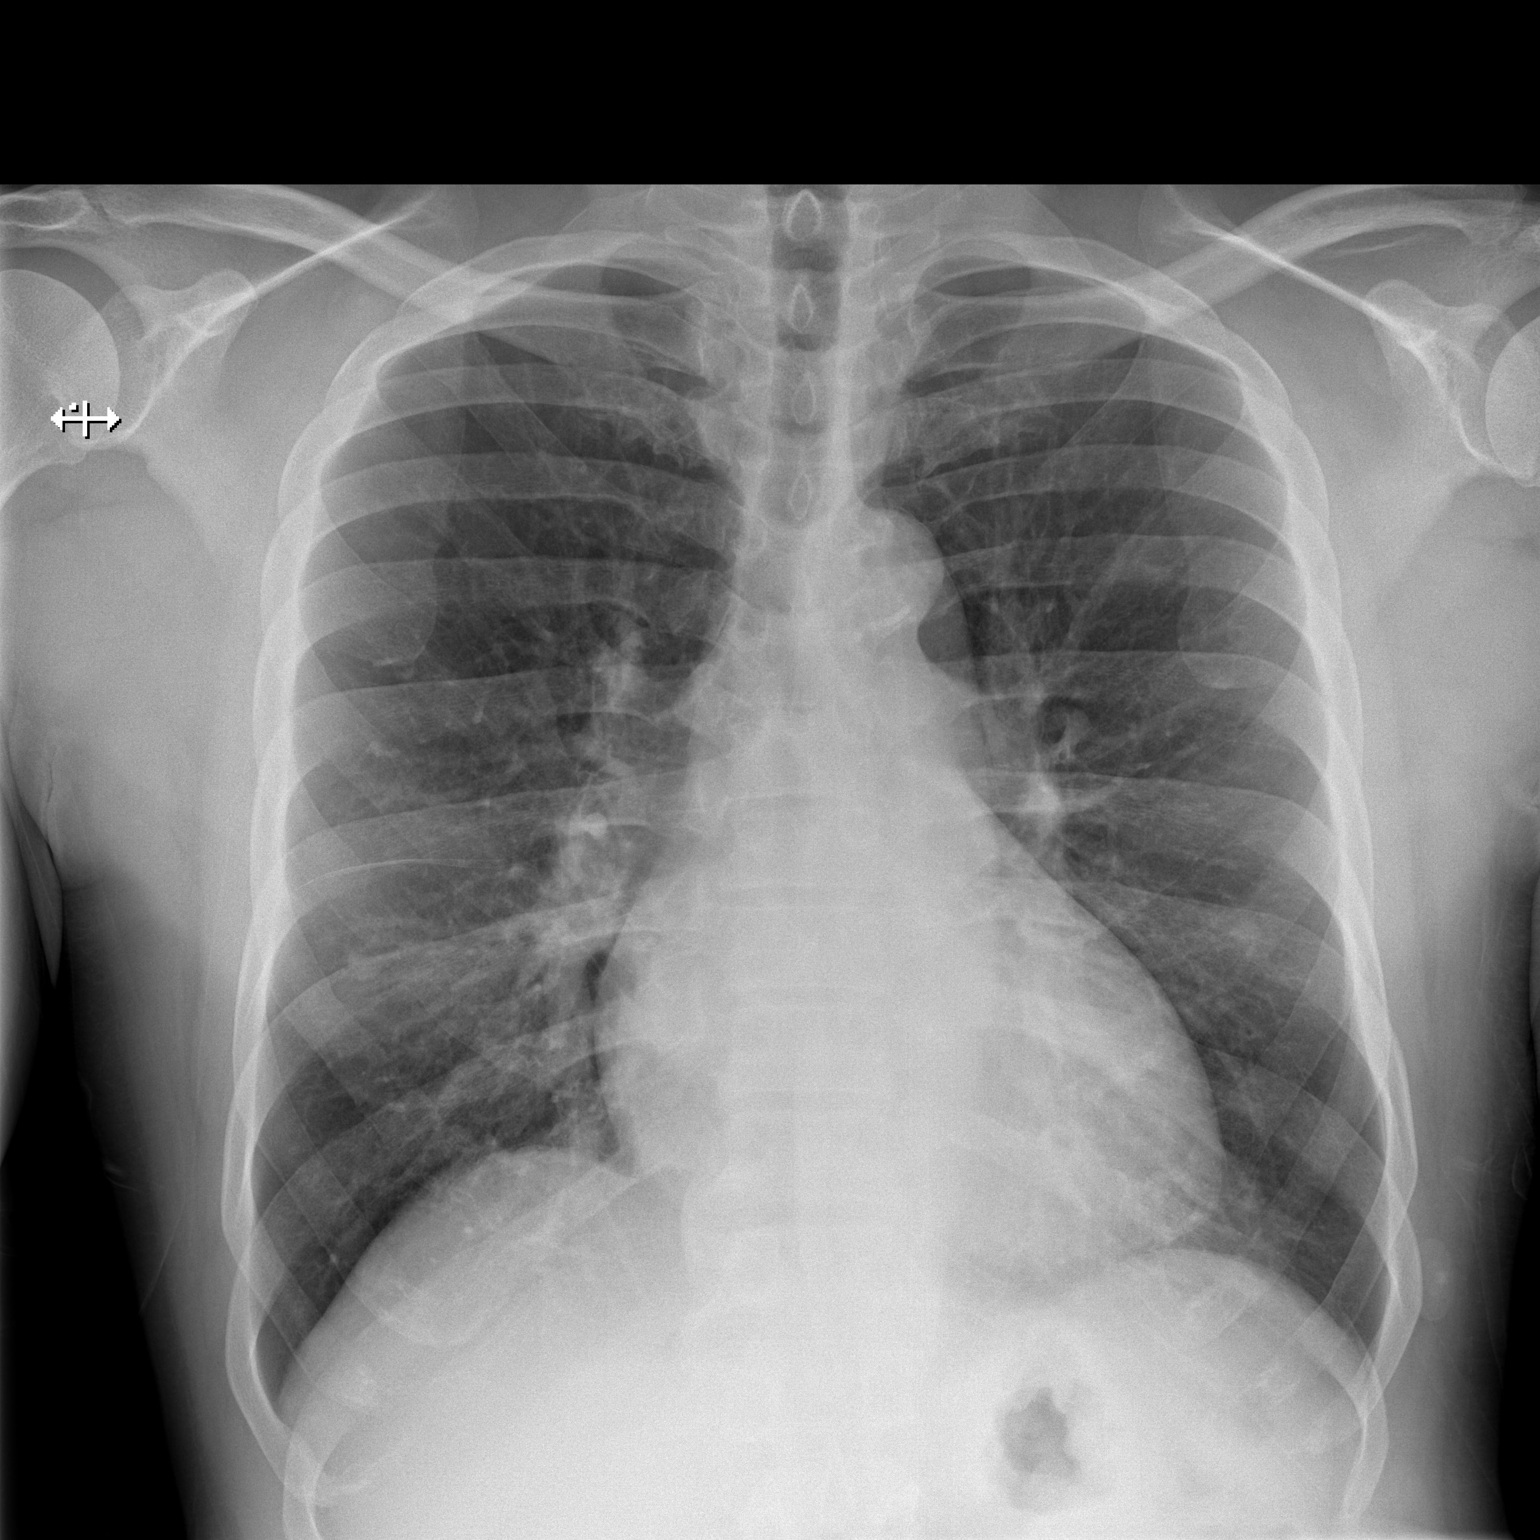

[w chest lat]
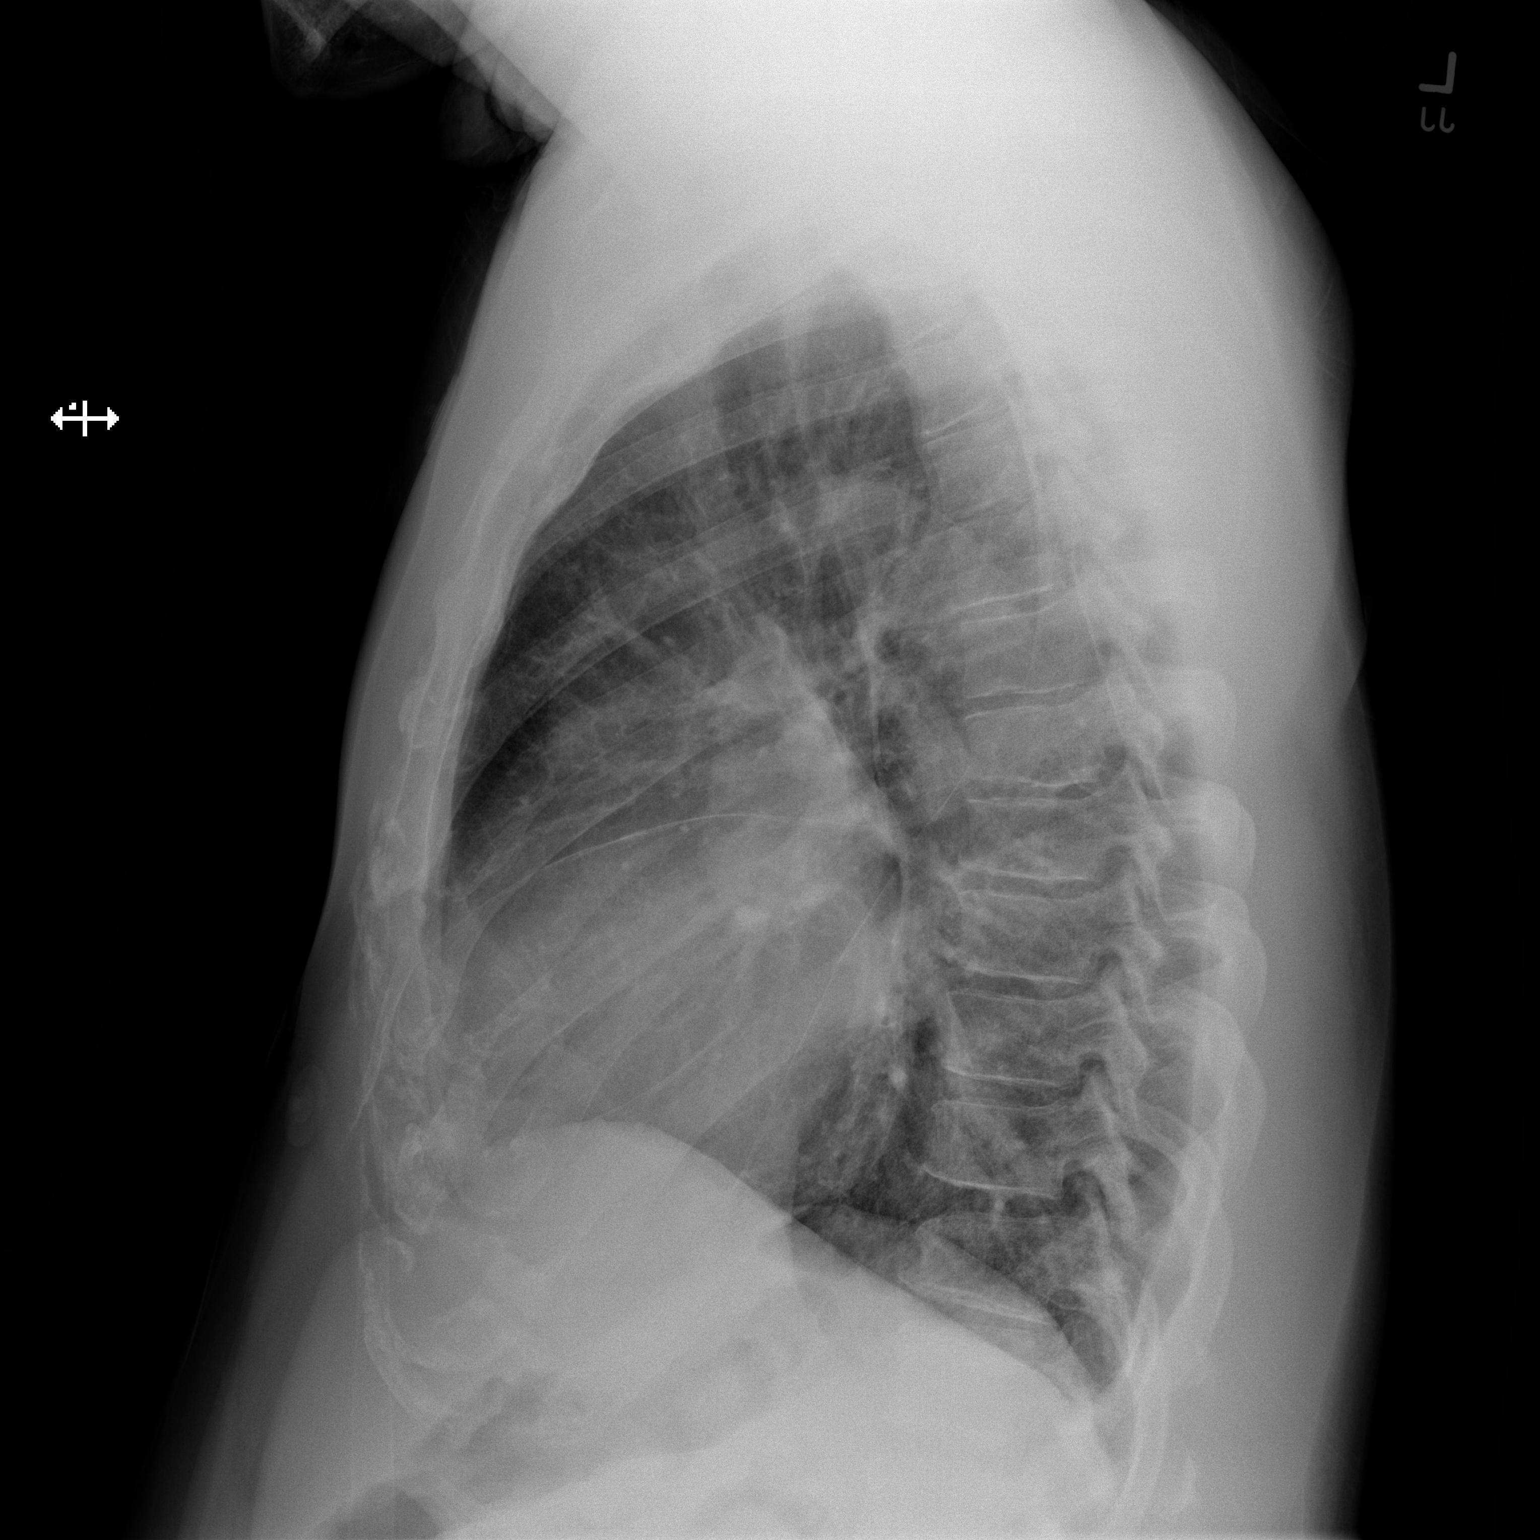

[2 of 2 positions shown; findings below may reference images not displayed]

FINDINGS: No focal opacity or pleural effusion. Normal cardiomediastinal
silhouette. No pneumothorax. Mild aortic atherosclerosis.
IMPRESSION: No active cardiopulmonary disease.

## 2020-07-16 MED ORDER — TORSEMIDE 20 MG PO TABS: 20.0000 mg | ORAL_TABLET | Freq: Two times a day (BID) | ORAL | 0 refills | Status: DC

## 2020-07-16 MED ORDER — CLONIDINE HCL 0.1 MG PO TABS
0.1000 mg | ORAL_TABLET | Freq: Three times a day (TID) | ORAL | 0 refills | Status: DC
Start: 2020-07-16 — End: 2020-07-16

## 2020-07-16 MED ORDER — TORSEMIDE 20 MG PO TABS
20.0000 mg | ORAL_TABLET | Freq: Every day | ORAL | Status: DC
  Administered 2020-07-16: 20 mg via ORAL
  Filled 2020-07-16 (×2): qty 1

## 2020-07-16 MED ORDER — LISINOPRIL 20 MG PO TABS
20.0000 mg | ORAL_TABLET | Freq: Two times a day (BID) | ORAL | 0 refills | Status: DC
Start: 2020-07-16 — End: 2020-07-16

## 2020-07-16 MED ORDER — CARVEDILOL 3.125 MG PO TABS
3.1250 mg | ORAL_TABLET | Freq: Two times a day (BID) | ORAL | 0 refills | Status: DC
Start: 2020-07-16 — End: 2020-07-23

## 2020-07-16 MED ORDER — GABAPENTIN 600 MG PO TABS
600.0000 mg | ORAL_TABLET | Freq: Three times a day (TID) | ORAL | 0 refills | Status: DC
Start: 2020-07-16 — End: 2020-07-16

## 2020-07-16 MED ORDER — DIVALPROEX SODIUM 500 MG PO DR TAB
500.0000 mg | DELAYED_RELEASE_TABLET | Freq: Once | ORAL | Status: DC
  Filled 2020-07-16: qty 1

## 2020-07-16 MED ORDER — LISINOPRIL 20 MG PO TABS
20.0000 mg | ORAL_TABLET | Freq: Two times a day (BID) | ORAL | 0 refills | Status: DC
Start: 2020-07-16 — End: 2020-07-23

## 2020-07-16 MED ORDER — CARVEDILOL 3.125 MG PO TABS
3.1250 mg | ORAL_TABLET | Freq: Two times a day (BID) | ORAL | 0 refills | Status: DC
Start: 2020-07-16 — End: 2020-07-16

## 2020-07-16 MED ORDER — GABAPENTIN 300 MG PO CAPS
600.0000 mg | ORAL_CAPSULE | Freq: Once | ORAL | Status: AC
  Administered 2020-07-16: 600 mg via ORAL
  Filled 2020-07-16: qty 2

## 2020-07-16 MED ORDER — DIVALPROEX SODIUM ER 500 MG PO TB24
500.0000 mg | ORAL_TABLET | Freq: Two times a day (BID) | ORAL | 0 refills | Status: DC
Start: 2020-07-16 — End: 2020-07-16

## 2020-07-16 MED ORDER — CLONIDINE HCL 0.1 MG PO TABS
0.1000 mg | ORAL_TABLET | Freq: Once | ORAL | Status: AC
  Administered 2020-07-16: 0.1 mg via ORAL
  Filled 2020-07-16: qty 1

## 2020-07-16 MED ORDER — GABAPENTIN 600 MG PO TABS
600.0000 mg | ORAL_TABLET | Freq: Three times a day (TID) | ORAL | 0 refills | Status: DC
Start: 2020-07-16 — End: 2020-09-11

## 2020-07-16 MED ORDER — DIVALPROEX SODIUM ER 500 MG PO TB24
500.0000 mg | ORAL_TABLET | Freq: Two times a day (BID) | ORAL | 0 refills | Status: DC
Start: 2020-07-16 — End: 2020-09-11

## 2020-07-16 MED ORDER — CLONIDINE HCL 0.1 MG PO TABS
0.1000 mg | ORAL_TABLET | Freq: Three times a day (TID) | ORAL | 0 refills | Status: DC
Start: 2020-07-16 — End: 2020-07-23

## 2020-07-16 MED ORDER — LISINOPRIL 20 MG PO TABS
20.0000 mg | ORAL_TABLET | Freq: Once | ORAL | Status: AC
  Administered 2020-07-16: 20 mg via ORAL
  Filled 2020-07-16: qty 1

## 2020-07-16 MED ORDER — CARVEDILOL 3.125 MG PO TABS
3.1250 mg | ORAL_TABLET | Freq: Two times a day (BID) | ORAL | Status: DC
  Administered 2020-07-16: 3.125 mg via ORAL
  Filled 2020-07-16: qty 1

## 2020-07-16 MED FILL — cloNIDine HCL 0.1 MG TABS: 0.1 | 30 days supply | Qty: 90 | Fill #0

## 2020-07-16 MED FILL — GABAPENTIN 600 MG TABLET: 600 | 30 days supply | Qty: 90 | Fill #0

## 2020-07-16 MED FILL — CARVEDILOL 3.125 MG TABLET: 3.125 | 30 days supply | Qty: 60 | Fill #0

## 2020-07-16 MED FILL — DIVALPROEX SOD ER 500 MG TA: 500 | 30 days supply | Qty: 60 | Fill #0

## 2020-07-16 MED FILL — TORSEMIDE 20 MG TABLET: 20 | 30 days supply | Qty: 60 | Fill #0

## 2020-07-16 MED FILL — LISINOPRIL 20 MG TABLET: 20 | 30 days supply | Qty: 60 | Fill #0

## 2020-07-16 NOTE — ED Notes (Signed)
Pt provided lunch tray.

## 2020-07-16 NOTE — Social Work (Signed)
TOC CSW consult request has been received. CSW attempting to follow up at present time.   CSW will continue to follow for dc needs.  Marlyne Totaro Tarpley-Carter, MSW, [name]-A                  [place] ED Transitions of CareClinical Social Worker [email or web address] [phone] 

## 2020-07-16 NOTE — ED Notes (Signed)
Patient refused to reassess vital signs.

## 2020-07-16 NOTE — ED Provider Notes (Addendum)
[facility] DEPT Provider Note   CSN: [id number] Arrival date & time: 07/16/20  1062     History Chief Complaint  Patient presents with  . Shortness of Breath    [name] is a 54 y.o. male.  HPI   Patient presents ED for evaluation of shortness of breath.  Patient states he has a history of heart failure.  He feels like he is getting fluid overloaded.  Patient has not been on any of his medications for the last week.  Patient states he does not have a primary care doctor and recently came back to this area.  Patient denies any fevers or chills.  Is not having any chest pain.  He denies any Covid exposure or concerns.  Patient also complains of pain in his right big toe.  He has a sore on the bottom of his toe and that has been going on for a while he denies any fevers or chills.  Past Medical History:  Diagnosis Date  . Asthma   . Bipolar 1 disorder ([place])   . CHF (congestive heart failure) ([place])   . Depression   . Homelessness   . Hypertension   . QT prolongation   . Schizoaffective disorder [facility])     Patient Active Problem List   Diagnosis Date Noted  . Schizoaffective disorder ([place]) 06/24/2019  . Shortness of breath   . CHF exacerbation ([place]) 06/21/2019  . QT prolongation 05/25/2019  . Cocaine abuse ([place])   . Chest pain 05/24/2019  . Elevated troponin   . Homelessness   . Suicidal ideations   . Acute on chronic systolic heart failure ([place]) 04/18/2019  . Bilateral foot pain 04/05/2019  . Acute on chronic clinical systolic heart failure ([place]) 04/04/2019  . Housing situation unstable 03/28/2019  . AKI (acute kidney injury) ([place])   . Bilateral lower extremity edema   . Acute on chronic HFrEF (heart failure with reduced ejection fraction) ([place]) 03/27/2019  . COPD exacerbation ([place])   . Acute exacerbation of CHF (congestive heart failure) ([place]) 03/04/2019  . Asthma 12/26/2016  . Tobacco use disorder 12/26/2016  . Schizophrenia  ([place]) 12/26/2016  . Cocaine abuse with cocaine-induced mood disorder ([place]) 09/06/2016  . Alcohol use disorder, mild, abuse 07/31/2016  . Substance induced mood disorder ([place]) 07/30/2016  . Schizoaffective disorder, bipolar type ([place]) 05/31/2016  . Cocaine use disorder, severe, dependence ([place]) 05/31/2016  . Essential hypertension 05/31/2016    History reviewed. No pertinent surgical history.     Family History  Problem Relation Age of Onset  . Drug abuse Mother   . Suicidality Mother     Social History   Tobacco Use  . Smoking status: Current Every Day Smoker    Years: 30.00    Types: Cigarettes  . Smokeless tobacco: Never Used  Vaping Use  . Vaping Use: Never used  Substance Use Topics  . Alcohol use: Yes    Comment: infrequently  . Drug use: Yes    Types: Cocaine, Marijuana    Home Medications Prior to Admission medications   Medication Sig Start Date End Date Taking? Authorizing Provider  albuterol (PROVENTIL HFA;VENTOLIN HFA) 108 (90 Base) MCG/ACT inhaler Inhale 1-2 puffs into the lungs every 4 (four) hours as needed for shortness of breath. Patient not taking: Reported on 6/94/8546 2/70/35   Delora Fuel, MD  ARIPiprazole (ABILIFY) 2 MG tablet Take 1 tablet (2 mg total) by mouth at bedtime. Patient not taking: Reported on 06/21/2019 05/26/19  Matilde Haymaker, MD  carvedilol (COREG) 3.125 MG tablet Take 1 tablet (3.125 mg total) by mouth 2 (two) times daily with a meal. 07/16/20   Dorie Rank, MD  cloNIDine (CATAPRES) 0.1 MG tablet Take 1 tablet (0.1 mg total) by mouth 3 (three) times daily. 07/16/20   Dorie Rank, MD  divalproex (DEPAKOTE ER) 500 MG 24 hr tablet Take 1 tablet (500 mg total) by mouth in the morning and at bedtime. 07/16/20   Dorie Rank, MD  gabapentin (NEURONTIN) 600 MG tablet Take 1 tablet (600 mg total) by mouth 3 (three) times daily. 07/16/20   Dorie Rank, MD  lisinopril (ZESTRIL) 20 MG tablet Take 1 tablet (20 mg total) by mouth 2 (two) times daily.  07/16/20   Dorie Rank, MD  torsemide (DEMADEX) 20 MG tablet Take 1 tablet (20 mg total) by mouth 2 (two) times daily. 07/16/20   Dorie Rank, MD    Allergies    Ibuprofen, Acetaminophen, and Latex  Review of Systems   Review of Systems  All other systems reviewed and are negative.   Physical Exam Updated Vital Signs BP (!) 188/121 (BP Location: Right Arm)   Pulse 75   Temp 97.8 F (36.6 C) (Oral)   Resp 20   Ht 1.753 m ('5\' 9"' )   Wt 90.7 kg   SpO2 99%   BMI 29.53 kg/m   Physical Exam Vitals and nursing note reviewed.  Constitutional:      General: He is not in acute distress.    Appearance: He is well-developed.  HENT:     Head: Normocephalic and atraumatic.     Right Ear: External ear normal.     Left Ear: External ear normal.  Eyes:     General: No scleral icterus.       Right eye: No discharge.        Left eye: No discharge.     Conjunctiva/sclera: Conjunctivae normal.  Neck:     Trachea: No tracheal deviation.  Cardiovascular:     Rate and Rhythm: Normal rate and regular rhythm.  Pulmonary:     Effort: Pulmonary effort is normal. No respiratory distress.     Breath sounds: Normal breath sounds. No stridor. No wheezing or rales.     Comments: Lungs are clear, no crackles Abdominal:     General: Bowel sounds are normal. There is no distension.     Palpations: Abdomen is soft.     Tenderness: There is no abdominal tenderness. There is no guarding or rebound.  Musculoskeletal:        General: No tenderness.     Cervical back: Neck supple.     Right lower leg: No edema.     Left lower leg: No edema.     Comments: Firm and brittle callus and possible wart on the plantar aspect of the big toe  Skin:    General: Skin is warm and dry.     Findings: No rash.  Neurological:     Mental Status: He is alert.     Cranial Nerves: No cranial nerve deficit (no facial droop, extraocular movements intact, no slurred speech).     Sensory: No sensory deficit.     Motor: No  abnormal muscle tone or seizure activity.     Coordination: Coordination normal.     ED Results / Procedures / Treatments   Labs (all labs ordered are listed, but only abnormal results are displayed) Labs Reviewed  COMPREHENSIVE METABOLIC PANEL - Abnormal; Notable for  the following components:      Result Value   CO2 20 (*)    Glucose, Bld 123 (*)    All other components within normal limits  URINALYSIS, ROUTINE W REFLEX MICROSCOPIC - Abnormal; Notable for the following components:   Color, Urine STRAW (*)    Hgb urine dipstick SMALL (*)    All other components within normal limits  CBC WITH DIFFERENTIAL/PLATELET    EKG EKG Interpretation  Date/Time:  Monday July 16 2020 01:09:59 EDT Ventricular Rate:  80 PR Interval:    QRS Duration: 96 QT Interval:  457 QTC Calculation: 528 R Axis:   10 Text Interpretation: Sinus rhythm Probable left atrial enlargement Left ventricular hypertrophy Prolonged QT interval 12 Lead; [place] No significant change since last tracing Confirmed by Blanchie Dessert [phone]) on 07/16/2020 7:58:50 AM   Radiology DG Chest 2 View  Result Date: 07/16/2020 CLINICAL DATA:  Shortness of breath EXAM: CHEST - 2 VIEW COMPARISON:  06/21/2019 FINDINGS: No focal opacity or pleural effusion. Normal cardiomediastinal silhouette. No pneumothorax. Mild aortic atherosclerosis. IMPRESSION: No active cardiopulmonary disease. Electronically Signed   By: Donavan Foil M.D.   On: 07/16/2020 02:39    Procedures Procedures (including critical care time)  Medications Ordered in ED Medications  cloNIDine (CATAPRES) tablet 0.1 mg (has no administration in time range)  gabapentin (NEURONTIN) capsule 600 mg (has no administration in time range)  torsemide (DEMADEX) tablet 20 mg (has no administration in time range)  lisinopril (ZESTRIL) tablet 20 mg (has no administration in time range)  divalproex (DEPAKOTE) DR tablet 500 mg (has no administration in time range)   carvedilol (COREG) tablet 3.125 mg (has no administration in time range)    ED Course  I have reviewed the triage vital signs and the nursing notes.  Pertinent labs & imaging results that were available during my care of the patient were reviewed by me and considered in my medical decision making (see chart for details).  Clinical Course as of Jul 17 1319  Mon Jul 16, 2020  1313 Laboratory tests shows normal metabolic panel and c-Met.  Chest x-ray does not show evidence of pulmonary edema.  Urinalysis unremarkable.   [JK]    Clinical Course User Index [JK] Dorie Rank, MD   MDM Rules/Calculators/A&P                          Patient does have hypertension.  On exam I do not see any significant pulmonary edema or peripheral edema.  Patient states he has not been taking any his medications.  Patient is supposed to be taking several medication including clonidine, gabapentin, torsemide, Coreg, lisinopril, and Depakote.  Patient was not ever telling me his medication list but I was able to review his records in the EMR.  Patient has had multiple visits to emergency room throughout our area.  Patient was seen on July 21, July 24, August 3, August 4, August 6, August 7 at various emergency rooms and I was able to change his medication list from those visits.  I have given the patient a dose of his clonidine, gabapentin, torsemide, Coreg, lisinopril and Depakote.  I will asked our case management to see if they can offer any medication assistance.  Patient otherwise appears stable for discharge.  He was given something to eat as well as requested.  Regarding his toe I recommended outpatient follow-up with a podiatrist.  He may need to first establish care with a  primary care doctor in order to have some assistance with that. Final Clinical Impression(s) / ED Diagnoses Final diagnoses:  Chronic congestive heart failure, unspecified heart failure type ([place])  Hypertension, unspecified type    Rx / DC  Orders ED Discharge Orders         Ordered    carvedilol (COREG) 3.125 MG tablet  2 times daily with meals     Discontinue  Reprint     07/16/20 1319    gabapentin (NEURONTIN) 600 MG tablet  3 times daily     Discontinue  Reprint     07/16/20 1319    torsemide (DEMADEX) 20 MG tablet  2 times daily     Discontinue  Reprint     07/16/20 1319    cloNIDine (CATAPRES) 0.1 MG tablet  3 times daily     Discontinue  Reprint     07/16/20 1319    lisinopril (ZESTRIL) 20 MG tablet  2 times daily     Discontinue  Reprint     07/16/20 1319    divalproex (DEPAKOTE ER) 500 MG 24 hr tablet  2 times daily     Discontinue  Reprint     07/16/20 1319           Dorie Rank, MD 07/16/20 1320  Case management consulted.  Arrangements have been made for [facility]   Dorie Rank, MD 07/16/20 1353

## 2020-07-16 NOTE — ED Triage Notes (Signed)
Patient came by [facility] by [facility]. Patient complaining of sob. Patient has not had his lasix and metformin for the last week. Patient is very arguementive. Patient does not like to be questioned.

## 2020-07-16 NOTE — Social Work (Signed)
TOC CSW spoke with pt.  Pt has been dc'd but is waiting to see the doctor.  Pt is requesting the phone number for [facility] and transportation downtown.  CSW asked if he did help with any arrangements with [facility], pt stated, "no, just the phone number."     CSW presented pt with the phone number to [facility], and a bus pass for transportation.  Pt did state he was familiar with transit system.  Please reconsult if new social work issues arise, CSW signing off.  Lindley Stachnik Tarpley-Carter, MSW, [name]-A                  [facility] ED Transitions of Care Clinical Social Worker [email or web address] [phone]

## 2020-07-16 NOTE — Discharge Instructions (Addendum)
Take your medications as prescribed.  Follow-up with A  primary care doctor.  Consider seeing a podiatrist to help with the callus on the bottom of your big toe.

## 2020-07-16 NOTE — ED Notes (Signed)
Pt left [facility] and went to [facility].

## 2020-07-16 NOTE — ED Notes (Signed)
Save blue in main lab 

## 2020-07-16 NOTE — Progress Notes (Signed)
TOC CM spoke to pt at bedside. Pt states he will going to [facility]. Agreeable to appt at [facility], appt scheduled for 08/20/2020, Explained to pt the importance of not missing appt as he will need to establish with PCP to assist him with medication refills. Will provide pt with [facility] to [facility] to pick up meds for a one time free fill. And bus pass and [place] bus ticket to get to [facility]. Explained to pt he will need to have his Medicaid switched to [facility] Medicaid. Provided him with resources to follow up. States he does receive a monthly check. Isidoro Donning RN CCM, [facility] ED TOC CM [phone]

## 2020-07-16 NOTE — ED Notes (Signed)
Pt only allowed his blood pressure to be reassessed.

## 2020-07-21 ENCOUNTER — Emergency Department (HOSPITAL_COMMUNITY): Payer: Self-pay

## 2020-07-21 ENCOUNTER — Encounter (HOSPITAL_COMMUNITY): Payer: Self-pay

## 2020-07-21 ENCOUNTER — Inpatient Hospital Stay (HOSPITAL_COMMUNITY)
Admission: EM | Admit: 2020-07-21 | Discharge: 2020-07-23 | DRG: 641 | Disposition: A | Discharge status: Home / Self Care | Payer: Self-pay | Attending: Internal Medicine | Admitting: Internal Medicine

## 2020-07-21 ENCOUNTER — Other Ambulatory Visit: Payer: Self-pay

## 2020-07-21 DIAGNOSIS — I428 Other cardiomyopathies: Secondary | ICD-10-CM | POA: Diagnosis present

## 2020-07-21 DIAGNOSIS — E861 Hypovolemia: Secondary | ICD-10-CM | POA: Diagnosis present

## 2020-07-21 DIAGNOSIS — F319 Bipolar disorder, unspecified: Secondary | ICD-10-CM | POA: Diagnosis present

## 2020-07-21 DIAGNOSIS — E860 Dehydration: Principal | ICD-10-CM | POA: Diagnosis present

## 2020-07-21 DIAGNOSIS — I358 Other nonrheumatic aortic valve disorders: Secondary | ICD-10-CM | POA: Diagnosis present

## 2020-07-21 DIAGNOSIS — F17210 Nicotine dependence, cigarettes, uncomplicated: Secondary | ICD-10-CM | POA: Diagnosis present

## 2020-07-21 DIAGNOSIS — Z79899 Other long term (current) drug therapy: Secondary | ICD-10-CM

## 2020-07-21 DIAGNOSIS — R55 Syncope and collapse: Secondary | ICD-10-CM | POA: Diagnosis present

## 2020-07-21 DIAGNOSIS — I2510 Atherosclerotic heart disease of native coronary artery without angina pectoris: Secondary | ICD-10-CM | POA: Diagnosis present

## 2020-07-21 DIAGNOSIS — J449 Chronic obstructive pulmonary disease, unspecified: Secondary | ICD-10-CM | POA: Diagnosis present

## 2020-07-21 DIAGNOSIS — F259 Schizoaffective disorder, unspecified: Secondary | ICD-10-CM | POA: Diagnosis present

## 2020-07-21 DIAGNOSIS — N179 Acute kidney failure, unspecified: Secondary | ICD-10-CM | POA: Diagnosis present

## 2020-07-21 DIAGNOSIS — I110 Hypertensive heart disease with heart failure: Secondary | ICD-10-CM | POA: Diagnosis present

## 2020-07-21 DIAGNOSIS — R112 Nausea with vomiting, unspecified: Secondary | ICD-10-CM

## 2020-07-21 DIAGNOSIS — T405X1A Poisoning by cocaine, accidental (unintentional), initial encounter: Secondary | ICD-10-CM | POA: Diagnosis present

## 2020-07-21 DIAGNOSIS — I5042 Chronic combined systolic (congestive) and diastolic (congestive) heart failure: Secondary | ICD-10-CM | POA: Diagnosis present

## 2020-07-21 DIAGNOSIS — Z888 Allergy status to other drugs, medicaments and biological substances status: Secondary | ICD-10-CM

## 2020-07-21 DIAGNOSIS — R0789 Other chest pain: Secondary | ICD-10-CM | POA: Diagnosis present

## 2020-07-21 DIAGNOSIS — E872 Acidosis: Secondary | ICD-10-CM | POA: Diagnosis present

## 2020-07-21 DIAGNOSIS — E785 Hyperlipidemia, unspecified: Secondary | ICD-10-CM | POA: Diagnosis present

## 2020-07-21 DIAGNOSIS — Z590 Homelessness: Secondary | ICD-10-CM

## 2020-07-21 DIAGNOSIS — X58XXXA Exposure to other specified factors, initial encounter: Secondary | ICD-10-CM | POA: Diagnosis present

## 2020-07-21 DIAGNOSIS — Z813 Family history of other psychoactive substance abuse and dependence: Secondary | ICD-10-CM

## 2020-07-21 DIAGNOSIS — E875 Hyperkalemia: Secondary | ICD-10-CM | POA: Diagnosis present

## 2020-07-21 DIAGNOSIS — Z20822 Contact with and (suspected) exposure to covid-19: Secondary | ICD-10-CM | POA: Diagnosis present

## 2020-07-21 DIAGNOSIS — R9431 Abnormal electrocardiogram [ECG] [EKG]: Secondary | ICD-10-CM

## 2020-07-21 DIAGNOSIS — Z9119 Patient's noncompliance with other medical treatment and regimen: Secondary | ICD-10-CM

## 2020-07-21 LAB — CBC
HCT: 47.9 % (ref 39.0–52.0)
Hemoglobin: 16.2 g/dL (ref 13.0–17.0)
MCH: 32.3 pg (ref 26.0–34.0)
MCHC: 33.8 g/dL (ref 30.0–36.0)
MCV: 95.4 fL (ref 80.0–100.0)
Platelets: 299 10*3/uL (ref 150–400)
RBC: 5.02 MIL/uL (ref 4.22–5.81)
RDW: 14.4 % (ref 11.5–15.5)
WBC: 6.7 10*3/uL (ref 4.0–10.5)
nRBC: 0 % (ref 0.0–0.2)

## 2020-07-21 LAB — LACTIC ACID, PLASMA
Lactic Acid, Venous: 2.5 mmol/L (ref 0.5–1.9)
Lactic Acid, Venous: 5.3 mmol/L (ref 0.5–1.9)
Lactic Acid, Venous: 3 mmol/L (ref 0.5–1.9)
Lactic Acid, Venous: 3.8 mmol/L (ref 0.5–1.9)

## 2020-07-21 LAB — COMPREHENSIVE METABOLIC PANEL
ALT: 26 U/L (ref 0–44)
AST: 58 U/L — ABNORMAL HIGH (ref 15–41)
Albumin: 4.3 g/dL (ref 3.5–5.0)
Alkaline Phosphatase: 72 U/L (ref 38–126)
Anion gap: 17 — ABNORMAL HIGH (ref 5–15)
BUN: 29 mg/dL — ABNORMAL HIGH (ref 6–20)
CO2: 26 mmol/L (ref 22–32)
Calcium: 9.4 mg/dL (ref 8.9–10.3)
Chloride: 90 mmol/L — ABNORMAL LOW (ref 98–111)
Creatinine, Ser: 2.22 mg/dL — ABNORMAL HIGH (ref 0.61–1.24)
GFR calc Af Amer: 38 mL/min — ABNORMAL LOW (ref >60)
GFR calc non Af Amer: 33 mL/min — ABNORMAL LOW (ref >60)
Glucose, Bld: 99 mg/dL (ref 70–99)
Potassium: 5.8 mmol/L — ABNORMAL HIGH (ref 3.5–5.1)
Sodium: 133 mmol/L — ABNORMAL LOW (ref 135–145)
Total Bilirubin: 2.3 mg/dL — ABNORMAL HIGH (ref 0.3–1.2)

## 2020-07-21 LAB — LIPASE, BLOOD: Lipase: 31 U/L (ref 11–51)

## 2020-07-21 LAB — ETHANOL: Alcohol, Ethyl (B): <10 mg/dL (ref <10)

## 2020-07-21 LAB — RAPID URINE DRUG SCREEN, HOSP PERFORMED
Amphetamines: NOT DETECTED
Barbiturates: NOT DETECTED
Benzodiazepines: NOT DETECTED
Cocaine: POSITIVE — AB
Opiates: NOT DETECTED
Tetrahydrocannabinol: NOT DETECTED

## 2020-07-21 LAB — TROPONIN I (HIGH SENSITIVITY)
Troponin I (High Sensitivity): 22 ng/L — ABNORMAL HIGH (ref <18)
Troponin I (High Sensitivity): 23 ng/L — ABNORMAL HIGH (ref <18)

## 2020-07-21 LAB — BRAIN NATRIURETIC PEPTIDE: B Natriuretic Peptide: 46.3 pg/mL (ref 0.0–100.0)

## 2020-07-21 LAB — URINALYSIS, ROUTINE W REFLEX MICROSCOPIC
Bilirubin Urine: NEGATIVE
Glucose, UA: 50 mg/dL — AB
Hgb urine dipstick: NEGATIVE
Ketones, ur: NEGATIVE mg/dL
Leukocytes,Ua: NEGATIVE
Nitrite: NEGATIVE
Protein, ur: 30 mg/dL — AB
Specific Gravity, Urine: 1.019 (ref 1.005–1.030)
pH: 5 (ref 5.0–8.0)

## 2020-07-21 LAB — TSH: TSH: 1.282 u[IU]/mL (ref 0.350–4.500)

## 2020-07-21 LAB — HIV ANTIBODY (ROUTINE TESTING W REFLEX): HIV Screen 4th Generation wRfx: NONREACTIVE

## 2020-07-21 LAB — SARS CORONAVIRUS 2 BY RT PCR (HOSPITAL ORDER, PERFORMED IN ~~LOC~~ HOSPITAL LAB): SARS Coronavirus 2: NEGATIVE

## 2020-07-21 IMAGING — DX DG CHEST 1V PORT
1 series · 1 of 1 positions shown · non-contrast
Comparison: [DATE] and prior studies

CLINICAL DATA: Acute shortness of breath.

EXAM:
PORTABLE CHEST 1 VIEW

[chest ap]
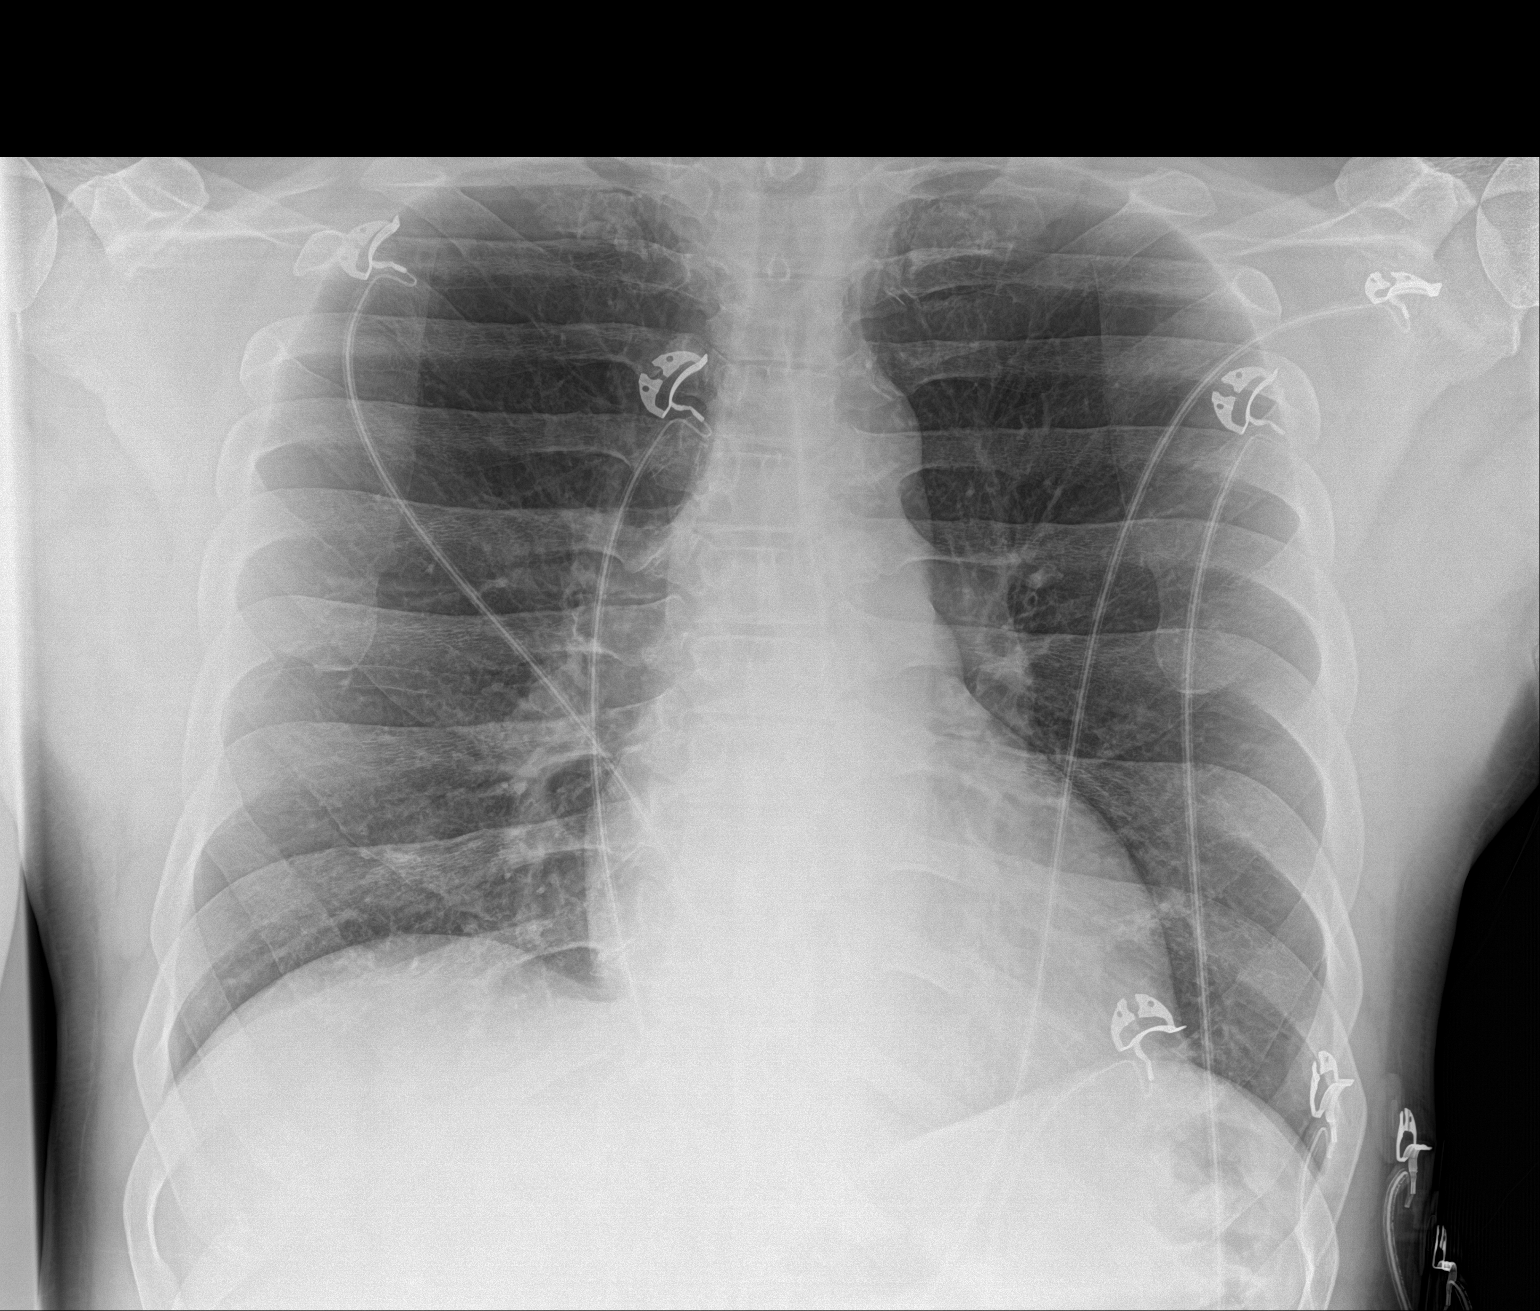

[1 of 1 positions shown; findings below may reference images not displayed]

FINDINGS: The cardiomediastinal silhouette is unremarkable.

There is no evidence of focal airspace disease, pulmonary edema,
suspicious pulmonary nodule/mass, pleural effusion, or pneumothorax.

No acute bony abnormalities are identified.
IMPRESSION: No active disease.

## 2020-07-21 IMAGING — CT CT ABD-PELV W/O CM
2 of 4 series · 17 of 46 positions shown, 19 images · non-contrast
Comparison: None.

CLINICAL DATA: Generalized abdominal pain, nausea and vomiting.

EXAM:
CT ABDOMEN AND PELVIS WITHOUT CONTRAST
TECHNIQUE: Multidetector CT imaging of the abdomen and pelvis was performed
following the standard protocol without IV contrast.

[Series 3: a/p w/o 5mm · axial · non-contrast · 0.74mm/px · z∈[+1130,+1534]mm · 14 of 89 slices shown, 16 images]
[im 4/89  soft-tissue]
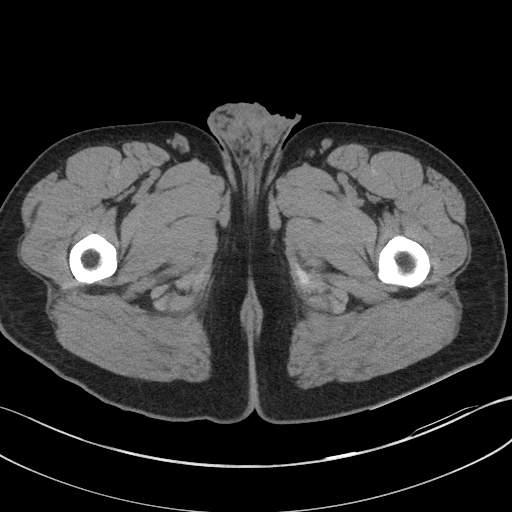
[im 4/89  bone]
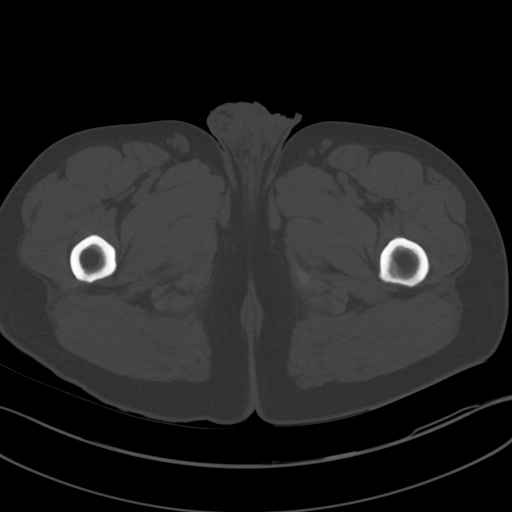
[im 11/89  soft-tissue]
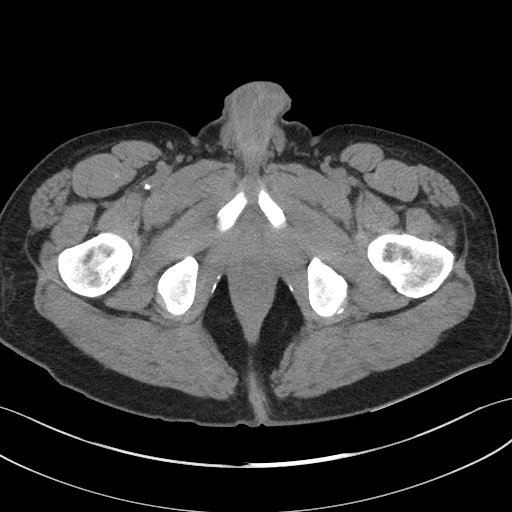
[im 18/89  soft-tissue]
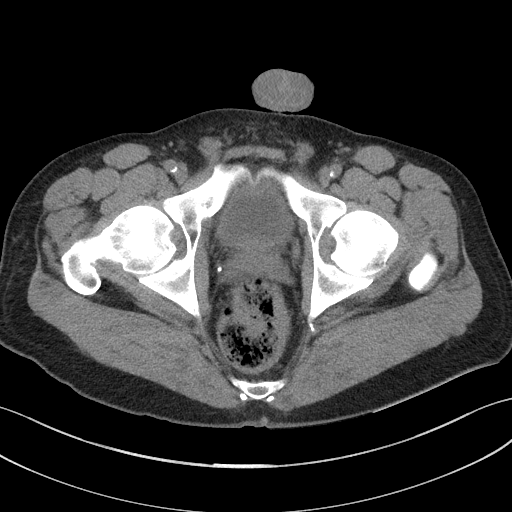
[im 25/89  soft-tissue]
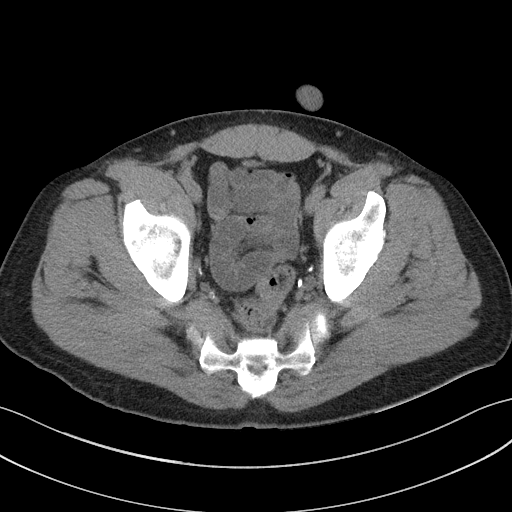
[im 29/89  soft-tissue]
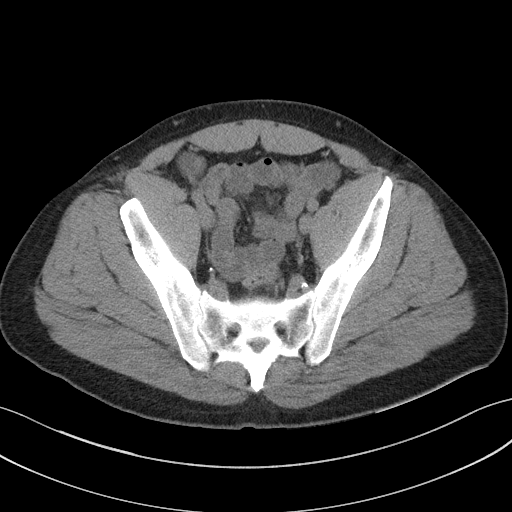
[im 36/89  soft-tissue]
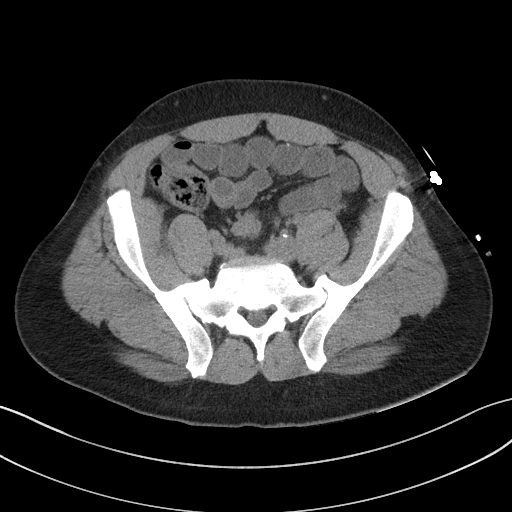
[im 43/89  soft-tissue]
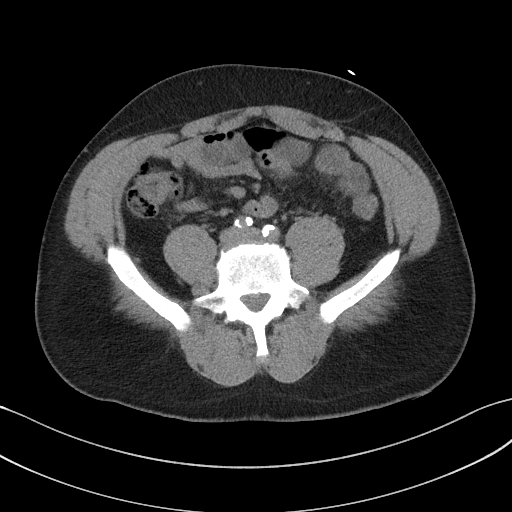
[im 46/89  soft-tissue]
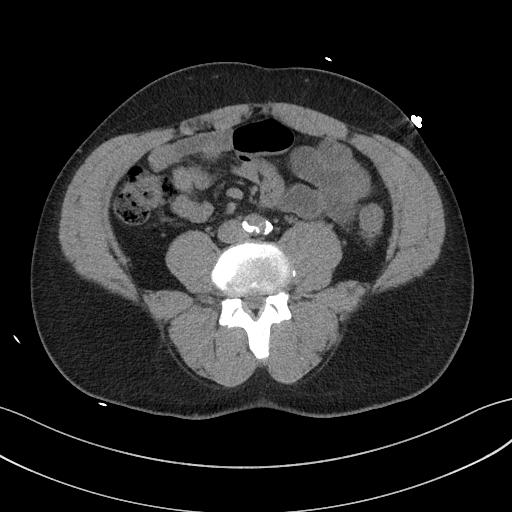
[im 53/89  soft-tissue]
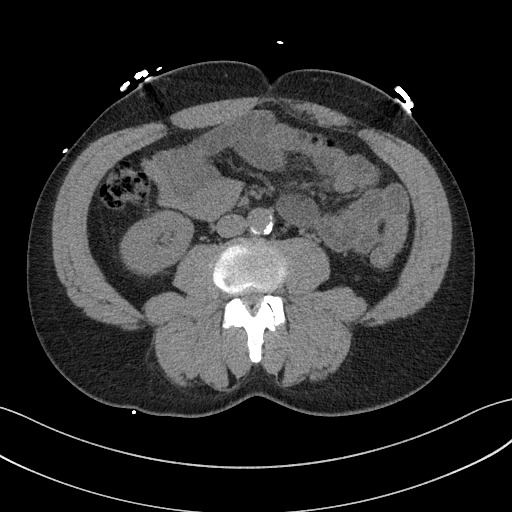
[im 53/89  bone]
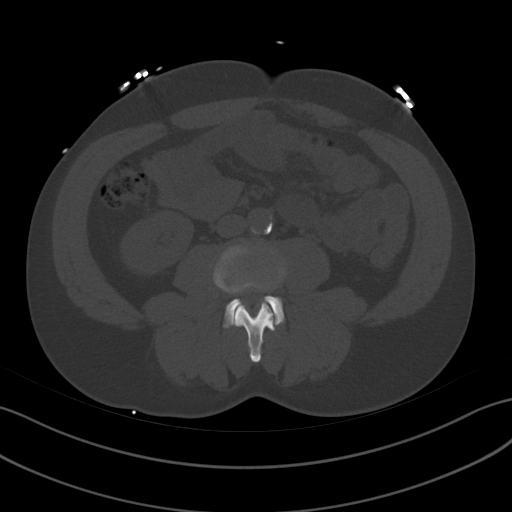
[im 60/89  soft-tissue]
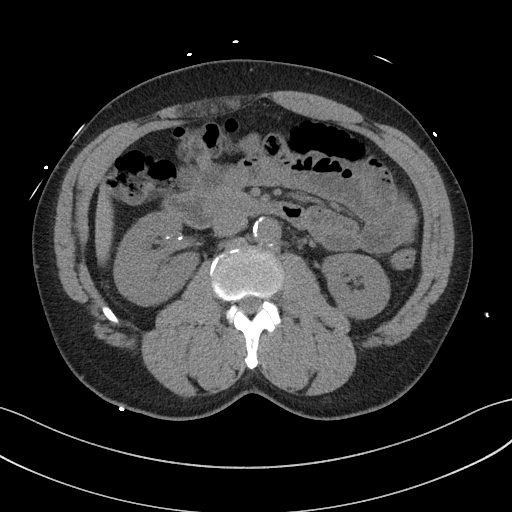
[im 67/89  soft-tissue]
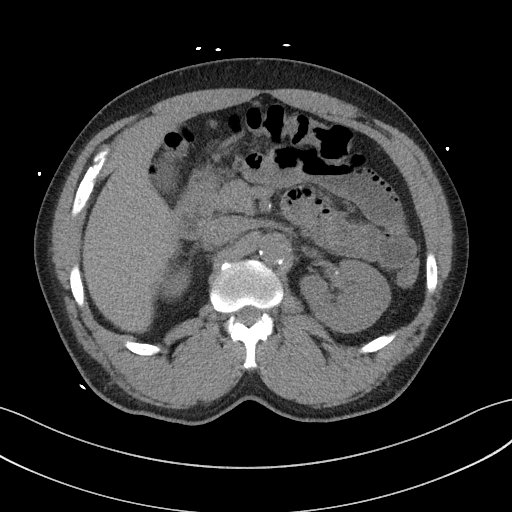
[im 71/89  soft-tissue]
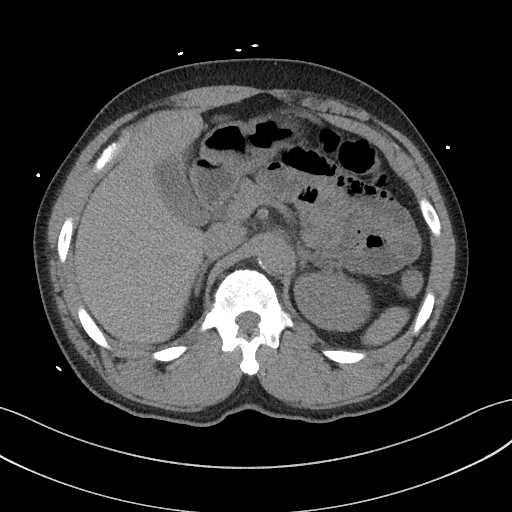
[im 78/89  soft-tissue]
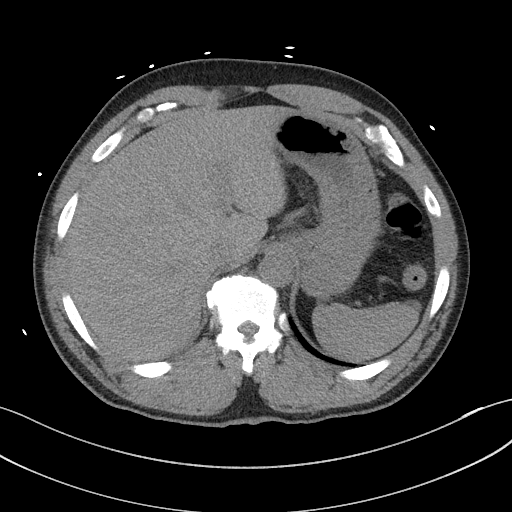
[im 85/89  soft-tissue]
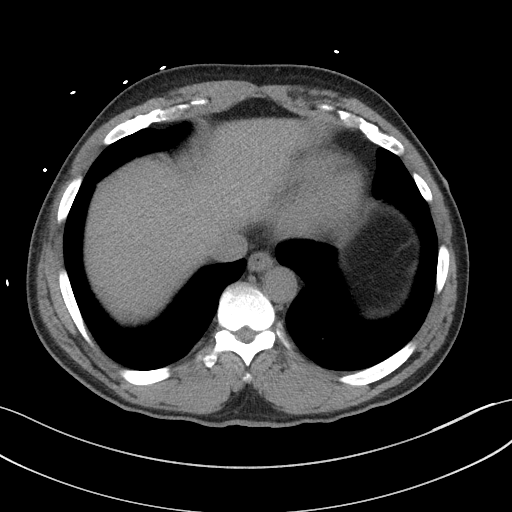

[Series 6: a/p w/o cor · coronal · non-contrast · 0.78mm/px · 3 of 149 slices shown]
[im 50/149  soft-tissue]
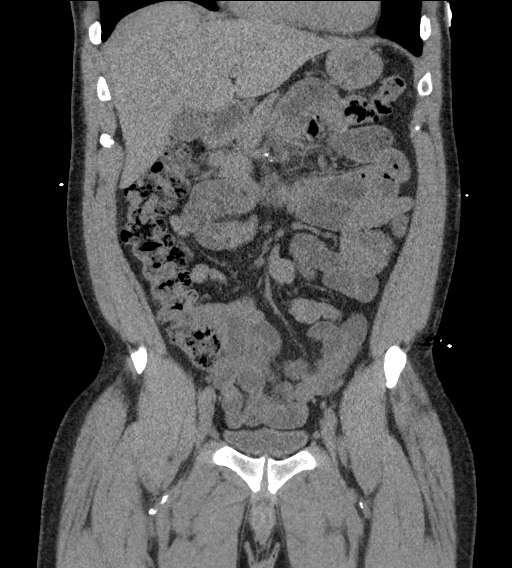
[im 66/149  soft-tissue]
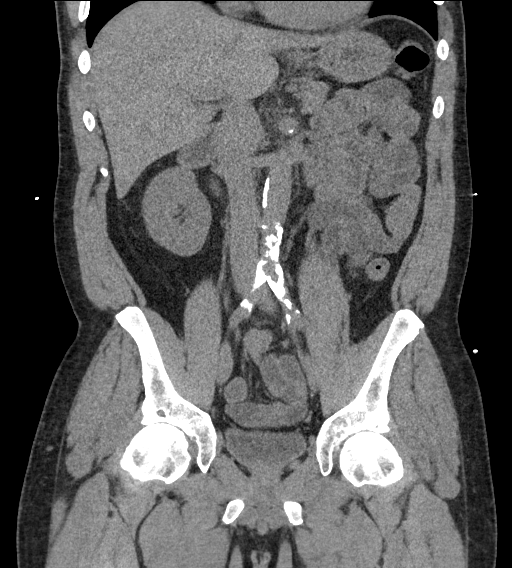
[im 83/149  soft-tissue]
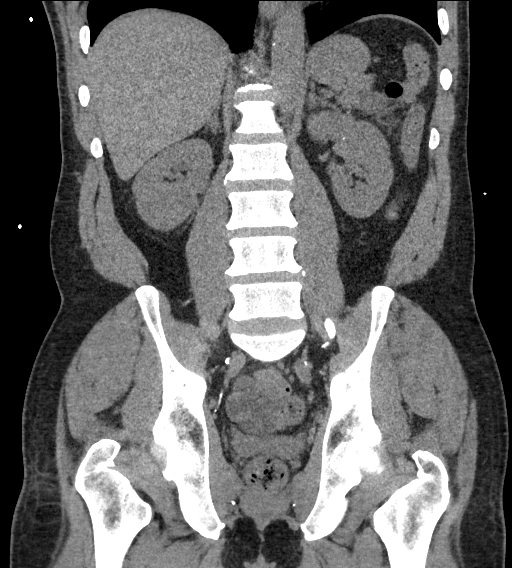

[17 of 46 positions shown; findings below may reference images not displayed]

FINDINGS: Lower chest: No significant pulmonary nodules or acute consolidative
airspace disease.

Hepatobiliary: Normal liver size. No liver mass. Normal gallbladder
with no radiopaque cholelithiasis. No biliary ductal dilatation.

Pancreas: Normal, with no mass or duct dilation.

Spleen: Normal size. No mass.

Adrenals/Urinary Tract: Normal adrenals. No renal stones. No
hydronephrosis. No contour deforming renal masses. Normal bladder.

Stomach/Bowel: Normal non-distended stomach. Normal caliber small
bowel with no small bowel wall thickening. Appendix not discretely
visualized. No pericecal inflammatory changes. Normal large bowel
with no diverticulosis, large bowel wall thickening or pericolonic
fat stranding.

Vascular/Lymphatic: Atherosclerotic nonaneurysmal abdominal aorta.
No pathologically enlarged lymph nodes in the abdomen or pelvis.

Reproductive: Normal size prostate.

Other: No pneumoperitoneum, ascites or focal fluid collection.

Musculoskeletal: No aggressive appearing focal osseous lesions. Mild
thoracolumbar spondylosis.
IMPRESSION: 1. No acute abnormality. No evidence of bowel obstruction or acute
bowel inflammation.
2. Aortic Atherosclerosis ([BB]-[BB]).

## 2020-07-21 MED ORDER — ENOXAPARIN SODIUM 40 MG/0.4ML ~~LOC~~ SOLN
40.0000 mg | SUBCUTANEOUS | Status: DC
  Administered 2020-07-21: 40 mg via SUBCUTANEOUS
  Filled 2020-07-21 (×2): qty 0.4

## 2020-07-21 MED ORDER — POLYETHYLENE GLYCOL 3350 17 G PO PACK: 17.0000 g | PACK | Freq: Every day | ORAL | Status: DC | PRN

## 2020-07-21 MED ORDER — LIDOCAINE 5 % EX PTCH
1.0000 | MEDICATED_PATCH | CUTANEOUS | Status: DC
  Administered 2020-07-21 – 2020-07-22 (×2): 1 via TRANSDERMAL
  Filled 2020-07-21 (×2): qty 1

## 2020-07-21 MED ORDER — GABAPENTIN 600 MG PO TABS
600.0000 mg | ORAL_TABLET | Freq: Three times a day (TID) | ORAL | Status: DC
  Administered 2020-07-21 – 2020-07-23 (×5): 600 mg via ORAL
  Filled 2020-07-21 (×6): qty 1

## 2020-07-21 MED ORDER — FENTANYL CITRATE (PF) 100 MCG/2ML IJ SOLN
50.0000 ug | Freq: Once | INTRAMUSCULAR | Status: AC
  Administered 2020-07-21: 50 ug via INTRAVENOUS
  Filled 2020-07-21: qty 2

## 2020-07-21 MED ORDER — LACTATED RINGERS IV BOLUS
500.0000 mL | Freq: Once | INTRAVENOUS | Status: AC
  Administered 2020-07-21: 500 mL via INTRAVENOUS

## 2020-07-21 MED ORDER — DIVALPROEX SODIUM ER 500 MG PO TB24
500.0000 mg | ORAL_TABLET | Freq: Every day | ORAL | Status: DC
  Filled 2020-07-21 (×2): qty 1

## 2020-07-21 MED ORDER — SODIUM CHLORIDE 0.9% FLUSH
3.0000 mL | Freq: Two times a day (BID) | INTRAVENOUS | Status: DC
  Administered 2020-07-21 – 2020-07-23 (×3): 3 mL via INTRAVENOUS

## 2020-07-21 MED ORDER — ALUM & MAG HYDROXIDE-SIMETH 200-200-20 MG/5ML PO SUSP: 30.0000 mL | Freq: Four times a day (QID) | ORAL | Status: DC | PRN

## 2020-07-21 MED ORDER — TORSEMIDE 20 MG PO TABS: 20.0000 mg | ORAL_TABLET | Freq: Two times a day (BID) | ORAL | Status: DC

## 2020-07-21 MED ORDER — SODIUM ZIRCONIUM CYCLOSILICATE 5 G PO PACK
5.0000 g | PACK | Freq: Two times a day (BID) | ORAL | Status: DC
  Administered 2020-07-21: 5 g via ORAL
  Filled 2020-07-21 (×2): qty 1

## 2020-07-21 MED ORDER — NITROGLYCERIN 0.4 MG SL SUBL: 0.4000 mg | SUBLINGUAL_TABLET | SUBLINGUAL | Status: DC | PRN

## 2020-07-21 MED ORDER — ALBUTEROL SULFATE (2.5 MG/3ML) 0.083% IN NEBU
3.0000 mL | INHALATION_SOLUTION | RESPIRATORY_TRACT | Status: DC | PRN
  Administered 2020-07-22 – 2020-07-23 (×2): 3 mL via RESPIRATORY_TRACT
  Filled 2020-07-21 (×2): qty 3

## 2020-07-21 MED ORDER — SODIUM CHLORIDE 0.9 % IV BOLUS
500.0000 mL | Freq: Once | INTRAVENOUS | Status: AC
  Administered 2020-07-21: 500 mL via INTRAVENOUS

## 2020-07-21 NOTE — ED Provider Notes (Signed)
[facility] EMERGENCY DEPARTMENT Provider Note   CSN: [id number] Arrival date & time: 07/21/20  1135     History Chief Complaint  Patient presents with  . Abdominal Pain  . Emesis    [name] is a 54 y.o. male.  Patient is a 54 year old male who presents with abdominal pain and shortness of breath.  He has a history of bipolar disorder, schizoaffective disorder, CHF, hypertension and QT prolongation.  He is currently homeless.  He says that he is from [place] but currently staying in [place].  He states he does not have a primary care provider.  He has some of his medications but not all of them.  He does say that he has been taking his torsemide.  He supposed to be on home oxygen at 2 L/min by nasal cannula but he says it is currently on a [employer] bus and he is trying to track it down.  He reports that he has had abdominal pain and not feeling well for about 2 days.  Got worse today.  He complains of generalized abdominal pain associated with nausea and vomiting.  His emesis is nonbloody and nonbilious.  He denies any change in bowels.  No known fevers.  He has had some shortness of breath.  He does report some intermittent chest pain but he says its not localized to one area.  It moves around to different places.  He denies any current chest pain.  He does have a cough which is productive of some clear white mucus.  He reports a little bit of rhinorrhea.  No increased leg swelling.        Past Medical History:  Diagnosis Date  . Asthma   . Bipolar 1 disorder (HCC)   . CHF (congestive heart failure) (HCC)   . Depression   . Homelessness   . Hypertension   . QT prolongation   . Schizoaffective disorder [facility])     Patient Active Problem List   Diagnosis Date Noted  . Schizoaffective disorder (HCC) 06/24/2019  . Shortness of breath   . CHF exacerbation (HCC) 06/21/2019  . QT prolongation 05/25/2019  . Cocaine abuse (HCC)   . Chest pain  05/24/2019  . Elevated troponin   . Homelessness   . Suicidal ideations   . Acute on chronic systolic heart failure (HCC) 04/18/2019  . Bilateral foot pain 04/05/2019  . Acute on chronic clinical systolic heart failure (HCC) 04/04/2019  . Housing situation unstable 03/28/2019  . AKI (acute kidney injury) (HCC)   . Bilateral lower extremity edema   . Acute on chronic HFrEF (heart failure with reduced ejection fraction) (HCC) 03/27/2019  . COPD exacerbation (HCC)   . Acute exacerbation of CHF (congestive heart failure) (HCC) 03/04/2019  . Asthma 12/26/2016  . Tobacco use disorder 12/26/2016  . Schizophrenia (HCC) 12/26/2016  . Cocaine abuse with cocaine-induced mood disorder (HCC) 09/06/2016  . Alcohol use disorder, mild, abuse 07/31/2016  . Substance induced mood disorder (HCC) 07/30/2016  . Schizoaffective disorder, bipolar type (HCC) 05/31/2016  . Cocaine use disorder, severe, dependence (HCC) 05/31/2016  . Essential hypertension 05/31/2016    History reviewed. No pertinent surgical history.     Family History  Problem Relation Age of Onset  . Drug abuse Mother   . Suicidality Mother     Social History   Tobacco Use  . Smoking status: Current Every Day Smoker    Years: 30.00    Types: Cigarettes  . Smokeless tobacco: Never  Used  Vaping Use  . Vaping Use: Never used  Substance Use Topics  . Alcohol use: Yes    Comment: infrequently  . Drug use: Yes    Types: Cocaine, Marijuana    Home Medications Prior to Admission medications   Medication Sig Start Date End Date Taking? Authorizing Provider  albuterol (PROVENTIL HFA;VENTOLIN HFA) 108 (90 Base) MCG/ACT inhaler Inhale 1-2 puffs into the lungs every 4 (four) hours as needed for shortness of breath. Patient not taking: Reported on 05/24/2019 08/22/18   Dione Booze, MD  ARIPiprazole (ABILIFY) 2 MG tablet Take 1 tablet (2 mg total) by mouth at bedtime. Patient not taking: Reported on 06/21/2019 05/26/19   Mirian Mo,  MD  carvedilol (COREG) 3.125 MG tablet Take 1 tablet (3.125 mg total) by mouth 2 (two) times daily with a meal. 07/16/20   Linwood Dibbles, MD  cloNIDine (CATAPRES) 0.1 MG tablet Take 1 tablet (0.1 mg total) by mouth 3 (three) times daily. 07/16/20   Linwood Dibbles, MD  divalproex (DEPAKOTE ER) 500 MG 24 hr tablet Take 1 tablet (500 mg total) by mouth in the morning and at bedtime. 07/16/20   Linwood Dibbles, MD  gabapentin (NEURONTIN) 600 MG tablet Take 1 tablet (600 mg total) by mouth 3 (three) times daily. 07/16/20   Linwood Dibbles, MD  lisinopril (ZESTRIL) 20 MG tablet Take 1 tablet (20 mg total) by mouth 2 (two) times daily. 07/16/20   Linwood Dibbles, MD  torsemide (DEMADEX) 20 MG tablet Take 1 tablet (20 mg total) by mouth 2 (two) times daily. 07/16/20   Linwood Dibbles, MD    Allergies    Ibuprofen, Acetaminophen, and Latex  Review of Systems   Review of Systems  Physical Exam Updated Vital Signs BP 103/71 (BP Location: Right Arm)   Pulse 64   Temp 97.9 F (36.6 C) (Oral)   Resp 19   SpO2 100%   Physical Exam Constitutional:      Appearance: He is well-developed.  HENT:     Head: Normocephalic and atraumatic.  Eyes:     Pupils: Pupils are equal, round, and reactive to light.  Cardiovascular:     Rate and Rhythm: Normal rate and regular rhythm.     Heart sounds: Normal heart sounds.  Pulmonary:     Effort: Pulmonary effort is normal. No respiratory distress.     Breath sounds: Rales (Crackles in the bases bilaterally) present. No wheezing.  Chest:     Chest wall: No tenderness.  Abdominal:     General: Bowel sounds are normal.     Palpations: Abdomen is soft.     Tenderness: There is generalized abdominal tenderness. There is no guarding or rebound.  Musculoskeletal:        General: Normal range of motion.     Cervical back: Normal range of motion and neck supple.     Comments: No edema or calf tenderness  Lymphadenopathy:     Cervical: No cervical adenopathy.  Skin:    General: Skin is warm  and dry.     Findings: No rash.  Neurological:     Mental Status: He is alert and oriented to person, place, and time.     ED Results / Procedures / Treatments   Labs (all labs ordered are listed, but only abnormal results are displayed) Labs Reviewed  COMPREHENSIVE METABOLIC PANEL - Abnormal; Notable for the following components:      Result Value   Sodium 133 (*)    Potassium 5.8 (*)  Chloride 90 (*)    BUN 29 (*)    Creatinine, Ser 2.22 (*)    AST 58 (*)    Total Bilirubin 2.3 (*)    GFR calc non Af Amer 33 (*)    GFR calc Af Amer 38 (*)    Anion gap 17 (*)    All other components within normal limits  URINALYSIS, ROUTINE W REFLEX MICROSCOPIC - Abnormal; Notable for the following components:   APPearance HAZY (*)    Glucose, UA 50 (*)    Protein, ur 30 (*)    Bacteria, UA RARE (*)    All other components within normal limits  LACTIC ACID, PLASMA - Abnormal; Notable for the following components:   Lactic Acid, Venous 2.5 (*)    All other components within normal limits  RAPID URINE DRUG SCREEN, HOSP PERFORMED - Abnormal; Notable for the following components:   Cocaine POSITIVE (*)    All other components within normal limits  CULTURE, BLOOD (ROUTINE X 2)  CULTURE, BLOOD (ROUTINE X 2)  SARS CORONAVIRUS 2 BY RT PCR (HOSPITAL ORDER, PERFORMED IN [facility])  LIPASE, BLOOD  CBC  BRAIN NATRIURETIC PEPTIDE  ETHANOL  LACTIC ACID, PLASMA    EKG EKG Interpretation  Date/Time:  Saturday July 21 2020 12:15:06 EDT Ventricular Rate:  77 PR Interval:    QRS Duration: 154 QT Interval:  467 QTC Calculation: 529 R Axis:   41 Text Interpretation: Sinus rhythm Biatrial enlargement LVH with secondary repolarization abnormality Prolonged QT interval Confirmed by Rolan Bucco [phone]) on 07/21/2020 2:56:14 PM   Radiology CT Abdomen Pelvis Wo Contrast  Result Date: 07/21/2020 CLINICAL DATA:  Generalized abdominal pain, nausea and vomiting. EXAM: CT ABDOMEN  AND PELVIS WITHOUT CONTRAST TECHNIQUE: Multidetector CT imaging of the abdomen and pelvis was performed following the standard protocol without IV contrast. COMPARISON:  None. FINDINGS: Lower chest: No significant pulmonary nodules or acute consolidative airspace disease. Hepatobiliary: Normal liver size. No liver mass. Normal gallbladder with no radiopaque cholelithiasis. No biliary ductal dilatation. Pancreas: Normal, with no mass or duct dilation. Spleen: Normal size. No mass. Adrenals/Urinary Tract: Normal adrenals. No renal stones. No hydronephrosis. No contour deforming renal masses. Normal bladder. Stomach/Bowel: Normal non-distended stomach. Normal caliber small bowel with no small bowel wall thickening. Appendix not discretely visualized. No pericecal inflammatory changes. Normal large bowel with no diverticulosis, large bowel wall thickening or pericolonic fat stranding. Vascular/Lymphatic: Atherosclerotic nonaneurysmal abdominal aorta. No pathologically enlarged lymph nodes in the abdomen or pelvis. Reproductive: Normal size prostate. Other: No pneumoperitoneum, ascites or focal fluid collection. Musculoskeletal: No aggressive appearing focal osseous lesions. Mild thoracolumbar spondylosis. IMPRESSION: 1. No acute abnormality. No evidence of bowel obstruction or acute bowel inflammation. 2. Aortic Atherosclerosis (ICD10-I70.0). Electronically Signed   By: Delbert Phenix M.D.   On: 07/21/2020 13:42   DG Chest Port 1 View  Result Date: 07/21/2020 CLINICAL DATA:  Acute shortness of breath. EXAM: PORTABLE CHEST 1 VIEW COMPARISON:  07/16/2020 and prior studies FINDINGS: The cardiomediastinal silhouette is unremarkable. There is no evidence of focal airspace disease, pulmonary edema, suspicious pulmonary nodule/mass, pleural effusion, or pneumothorax. No acute bony abnormalities are identified. IMPRESSION: No active disease. Electronically Signed   By: Harmon Pier M.D.   On: 07/21/2020 13:59     Procedures Procedures (including critical care time)  Medications Ordered in ED Medications  sodium chloride 0.9 % bolus 500 mL (0 mLs Intravenous Stopped 07/21/20 1427)  fentaNYL (SUBLIMAZE) injection 50 mcg (50 mcg Intravenous Given  07/21/20 1407)    ED Course  I have reviewed the triage vital signs and the nursing notes.  Pertinent labs & imaging results that were available during my care of the patient were reviewed by me and considered in my medical decision making (see chart for details).    MDM Rules/Calculators/A&P                          Patient is a 54 year old male who presents with abdominal pain associated with nausea and vomiting.  He also has some shortness of breath and a bit of a cough.  His chest x-ray is clear without evidence of pneumonia.  CT of his abdomen pelvis shows no acute abnormality.  He was noted to be hypotensive on arrival however this improved with IV fluids.  He was given 1 L of IV fluids and his last blood pressure is 103/71.  He has a mild elevation in his lactate but no fever or WBC elevation.  I do not see any other suggestions of infection.  He does not have evidence of fluid overload or pulmonary edema on his x-ray.  His BNP is normal.  There is no ischemic changes on EKG.  He does not have symptoms that sound more concerning for ACS.  His urine is positive for cocaine which she has a history of abusing.  He is feeling somewhat better after IV fluids.  However he does have an acute kidney injury and some mild hyperkalemia.  This was treated with initial IV fluids.  I discussed the patient with the IM resident who will admit the pt under unassigned service. Final Clinical Impression(s) / ED Diagnoses Final diagnoses:  Non-intractable vomiting with nausea, unspecified vomiting type  Long QT interval  AKI (acute kidney injury) (HCC)  Hyperkalemia  Dehydration    Rx / DC Orders ED Discharge Orders    None       Rolan Bucco, MD 07/21/20  1609

## 2020-07-21 NOTE — ED Notes (Signed)
Pt 93% on RA, placed on 2 L [place], pt states he uses supplemental O2 at home

## 2020-07-21 NOTE — ED Notes (Signed)
Pt not cooperating with care, refusing covid swab at this time, removing BP cuff, this RN explained that pt has had low BP and needs to

## 2020-07-21 NOTE — ED Notes (Signed)
Pt very verbally aggressive to staff, attempted to get blood and IV access and pt started refusing. phlebotomist notified.

## 2020-07-21 NOTE — H&P (Signed)
Date: 07/21/2020               Patient Name:  [name] MRN: [id number]  DOB: May 23, 1966 Age / Sex: 54 y.o., male   PCP: Medicine, [name]         Medical Service: Internal Medicine Teaching Service         Attending Physician: Dr. Earl Lagos, MD    First Contact: Dr. Evlyn Kanner Pager: [phone]  Second Contact: Dr. Judeth Cornfield Pager: [phone]       After Hours (After 5p/  First Contact Pager: [phone]  weekends / holidays): Second Contact Pager: [phone]   Chief Complaint: Light-headedness  History of Present Illness:   [name] is a 54 yo M w/ PMH of HTN, COPD, Asthma, HFrEF (EF 15% on prior echo), htn, Cocaine use and tobacco use presenting to [facility] w/ complaint of light-headedness. He was in his usual state of health until 2 weeks ago when he began to have intermittent chest pain that moves from right to left with worsening severity when laying flat. He also noticed worsening orthopnea and abdominal pain with nausea. Last night he had 1 episode of NBNB emesis and this morning he had a near syncopal episode when he almost fell after feeling light-headed so he came to [facility] for evaluation. He mentions he is not vaccinated and denies any sick contact. He states his takes his home medications except for gabapentin as prescribed and his last drug use was 7 days prior.  On review of systems, he mentions dyspnea, weakness, nausea, fatigue, chest pain, vomiting, orthopnea. Denies any lower extremity edema, hemoptysis, diarrhea or constipation. Mentions endorsing headache.  Meds:  Current Meds  Medication Sig  . carvedilol (COREG) 3.125 MG tablet Take 1 tablet (3.125 mg total) by mouth 2 (two) times daily with a meal.  . cloNIDine (CATAPRES) 0.1 MG tablet Take 1 tablet (0.1 mg total) by mouth 3 (three) times daily.  . divalproex (DEPAKOTE ER) 500 MG 24 hr tablet Take 1 tablet (500 mg total) by mouth in the morning and at bedtime.  Marland Kitchen lisinopril (ZESTRIL) 20  MG tablet Take 1 tablet (20 mg total) by mouth 2 (two) times daily.  Marland Kitchen torsemide (DEMADEX) 20 MG tablet Take 1 tablet (20 mg total) by mouth 2 (two) times daily.   Allergies: Allergies as of 07/21/2020 - Review Complete 07/21/2020  Allergen Reaction Noted  . Ibuprofen Anaphylaxis 07/16/2020  . Acetaminophen Hives, Nausea And Vomiting, and Rash 08/10/2015   Past Medical History:  Diagnosis Date  . Asthma   . Bipolar 1 disorder (HCC)   . CHF (congestive heart failure) (HCC)   . Depression   . Homelessness   . Hypertension   . QT prolongation   . Schizoaffective disorder (HCC)    Family History:  Unable to provide  Social History: Currently homeless. Mentions 1 beer occasionally. Last drink last night. Haven't done any drugs in 7 days. Usually uses marijuana and cocaine. Smokes 1 pack tobacco use daily.   Review of Systems: A complete ROS was negative except as per HPI.   Physical Exam: Blood pressure 103/71, pulse 74, temperature 97.9 F (36.6 C), temperature source Oral, resp. rate (!) 22, SpO2 100 %.  Gen: Well-developed, well nourished, NAD HEENT: NCAT head, hearing intact, EOMI, PERRL, No nasal discharge, MMM Neck: supple, ROM intact, no JVD CV: RRR, S1, S2 normal Pulm: CTAB, No rales, no wheezes Abd: Soft, BS+, Diffuse tenderness to palpation Extm: ROM intact,  Peripheral pulses intact, No peripheral edema Skin: Dry, Warm, poor turgor, no rashes, lesions, wounds.  Neuro: AAOx3  EKG: personally reviewed my interpretation normal sinus, new T-wave inversions on II, III, V3, V4. No ST changes  CXR: personally reviewed my interpretation is no lobar consolidation, no pulmonary edema, no pleural effusion  Assessment & Plan by Problem: Active Problems:   Near syncope  [name] is a 54 yo M w/ PMH of HTN, COPD, Asthma, HFrEF (EF 35-40% on prior echo), htn, Cocaine use and tobacco use admit for near-syncope due to dehydration in setting of diuretic use.  Near-syncope  2/2 dehydration Presenting with episode of light-headedness this am with 'blacking out' sensation. On arrival to ED, found to have bp of 83/50. Currently is homeless and lives on the street. Takes torsemide for his heart failure. Hypovolemic on exam. Lactate 2.5. Will need gentle diuresis in setting of CHF history. - C/w LR bolus 500cc - Orthostatic vitals - Telemetry - PT eval - Hold anti-hypertensives, diuretics  Acute Kidney Injury Hyperkalemia On admission BUN 29, Creatinine 2.22. Likely due to dehydration and over-diuresis. Baseline bun 18, creatinine 1.05. UA w/ mild proteinuria, specific gravity 1.019. K elevated at 5.8. EKG w/o hyperkalemic changes - Fluid resuscitation as above - Start lokelma - Trend renal fx - Avoid nephrotoxic meds whena ble  Atypical Chest Pain Hx of cocaine use Present w/ atypical chest pain. Had multiple ED visits for similar complaint. Constant, not associated with exertion. On chart review, admit at [facility] in [place] on 05/2020. Stress test was performed which was deemed undiagnostic due to motion artifact. Cardiac CTA performed on 03/2020 showing multi-vessel disease. Unfortunately left AMA due to 'feeling disrespected' per notes from [place]. UDS on admission positive for cocaine. EKG w/ new T wave inversions. Although has multiple risk factors, likely chest pain due to cocaine use. - F/u troponin - Am EKG - Consider consulting cardiology if worsening chest pain  Chronic systolic heart failure Cocaine induced cardiomyopathy Noted to have prior echocardiogram showing EF of 35-40% from [facility]. At the time left ama prior to further intervention. On lisinopril, torsemide, carvedilol per home med list. Appear hypovolemic on exam. BNP at 46.3.  - Not in acute heart failure exacerbation - I/Os - Daily weights - Hold home meds in setting of hypotension  COPD Tobacco use Has hx of >30 pack year smoking history. On Symbicort and  albuterol rescue as needed. No significant wheezing on exam - C/w home meds  DVT prophx: lovenox Diet: Regular Bowel: Senokot Code: Full  Prior to Admission Living Arrangement: unstable housing Anticipated Discharge Location: unstable housing Barriers to Discharge: Medical treatment  Dispo: Admit patient to Observation with expected length of stay less than 2 midnights.  Signed: Theotis Barrio, MD 07/21/2020, 5:14 PM  Pager: [phone]

## 2020-07-21 NOTE — ED Triage Notes (Signed)
To triage via EMS.  [place] for abd pain, N/vomiting, last vomited this morning.  eMS BP 100/70 HR 90 standing HR 110 SpO2 10\0% Temp 97.6  CBG 92

## 2020-07-22 ENCOUNTER — Observation Stay (HOSPITAL_COMMUNITY): Payer: Self-pay

## 2020-07-22 DIAGNOSIS — E875 Hyperkalemia: Secondary | ICD-10-CM

## 2020-07-22 DIAGNOSIS — I427 Cardiomyopathy due to drug and external agent: Secondary | ICD-10-CM

## 2020-07-22 DIAGNOSIS — R778 Other specified abnormalities of plasma proteins: Secondary | ICD-10-CM

## 2020-07-22 DIAGNOSIS — E860 Dehydration: Principal | ICD-10-CM

## 2020-07-22 DIAGNOSIS — I110 Hypertensive heart disease with heart failure: Secondary | ICD-10-CM

## 2020-07-22 DIAGNOSIS — I340 Nonrheumatic mitral (valve) insufficiency: Secondary | ICD-10-CM

## 2020-07-22 DIAGNOSIS — I361 Nonrheumatic tricuspid (valve) insufficiency: Secondary | ICD-10-CM

## 2020-07-22 DIAGNOSIS — F17210 Nicotine dependence, cigarettes, uncomplicated: Secondary | ICD-10-CM

## 2020-07-22 DIAGNOSIS — J449 Chronic obstructive pulmonary disease, unspecified: Secondary | ICD-10-CM

## 2020-07-22 DIAGNOSIS — T405X1A Poisoning by cocaine, accidental (unintentional), initial encounter: Secondary | ICD-10-CM

## 2020-07-22 DIAGNOSIS — R55 Syncope and collapse: Secondary | ICD-10-CM

## 2020-07-22 DIAGNOSIS — I2510 Atherosclerotic heart disease of native coronary artery without angina pectoris: Secondary | ICD-10-CM

## 2020-07-22 DIAGNOSIS — R0789 Other chest pain: Secondary | ICD-10-CM

## 2020-07-22 DIAGNOSIS — I5022 Chronic systolic (congestive) heart failure: Secondary | ICD-10-CM

## 2020-07-22 DIAGNOSIS — N179 Acute kidney failure, unspecified: Secondary | ICD-10-CM

## 2020-07-22 DIAGNOSIS — I5042 Chronic combined systolic (congestive) and diastolic (congestive) heart failure: Secondary | ICD-10-CM

## 2020-07-22 LAB — BASIC METABOLIC PANEL
Anion gap: 12 (ref 5–15)
BUN: 34 mg/dL — ABNORMAL HIGH (ref 6–20)
CO2: 27 mmol/L (ref 22–32)
Calcium: 8.9 mg/dL (ref 8.9–10.3)
Chloride: 94 mmol/L — ABNORMAL LOW (ref 98–111)
Creatinine, Ser: 1.82 mg/dL — ABNORMAL HIGH (ref 0.61–1.24)
GFR calc Af Amer: 48 mL/min — ABNORMAL LOW (ref >60)
GFR calc non Af Amer: 42 mL/min — ABNORMAL LOW (ref >60)
Glucose, Bld: 133 mg/dL — ABNORMAL HIGH (ref 70–99)
Potassium: 3.2 mmol/L — ABNORMAL LOW (ref 3.5–5.1)
Sodium: 133 mmol/L — ABNORMAL LOW (ref 135–145)

## 2020-07-22 LAB — ECHOCARDIOGRAM COMPLETE
Area-P 1/2: 3.36 cm2
Calc EF: 39.9 %
Height: 69 in
S' Lateral: 4.9 cm
Single Plane A2C EF: 26.4 %
Single Plane A4C EF: 50.2 %
Weight: 3153.6 oz

## 2020-07-22 LAB — LACTIC ACID, PLASMA: Lactic Acid, Venous: 2 mmol/L (ref 0.5–1.9)

## 2020-07-22 LAB — CBC
HCT: 42.2 % (ref 39.0–52.0)
Hemoglobin: 14.4 g/dL (ref 13.0–17.0)
MCH: 32.7 pg (ref 26.0–34.0)
MCHC: 34.1 g/dL (ref 30.0–36.0)
MCV: 95.7 fL (ref 80.0–100.0)
Platelets: 235 10*3/uL (ref 150–400)
RBC: 4.41 MIL/uL (ref 4.22–5.81)
RDW: 14.1 % (ref 11.5–15.5)
WBC: 6.3 10*3/uL (ref 4.0–10.5)
nRBC: 0 % (ref 0.0–0.2)

## 2020-07-22 LAB — GLUCOSE, CAPILLARY: Glucose-Capillary: 105 mg/dL — ABNORMAL HIGH (ref 70–99)

## 2020-07-22 MED ORDER — LACTATED RINGERS IV BOLUS: 500.0000 mL | Freq: Once | INTRAVENOUS | Status: DC

## 2020-07-22 MED ORDER — SODIUM CHLORIDE 0.9 % IV BOLUS
500.0000 mL | Freq: Once | INTRAVENOUS | Status: AC
  Administered 2020-07-22: 500 mL via INTRAVENOUS

## 2020-07-22 MED ORDER — LACTATED RINGERS IV SOLN: INTRAVENOUS | Status: AC

## 2020-07-22 MED ORDER — ATORVASTATIN CALCIUM 10 MG PO TABS
20.0000 mg | ORAL_TABLET | Freq: Every day | ORAL | Status: DC
  Administered 2020-07-22 – 2020-07-23 (×2): 20 mg via ORAL
  Filled 2020-07-22 (×2): qty 2

## 2020-07-22 MED ORDER — POTASSIUM CHLORIDE CRYS ER 20 MEQ PO TBCR
40.0000 meq | EXTENDED_RELEASE_TABLET | ORAL | Status: AC
  Administered 2020-07-22 (×2): 40 meq via ORAL
  Filled 2020-07-22 (×2): qty 2

## 2020-07-22 NOTE — Progress Notes (Signed)
  Echocardiogram 2D Echocardiogram has been performed.  [name] F 07/22/2020, 10:42 AM

## 2020-07-22 NOTE — Consult Note (Signed)
Cardiology Consultation:   Patient ID: [name] MRN: [id number]; DOB: [birth year]  Admit date: 07/21/2020 Date of Consult: 07/22/2020  Primary Care Provider: Medicine, Pietro Cassis [facility] Cardiologist: No primary care provider on file.  [facility] Electrophysiologist:  None    Patient Profile:   [name] is a 54 y.o. male with a hx of CAD, chronic combined systolic and diastolic heart failure, COPD, asthma, hypertension, cocaine use, tobacco use who is being seen today for the evaluation of CAD at the request of Dr Heide Spark.  History of Present Illness:   Mr. Jarosz reports he was in his usual state of health until about 2 weeks ago.  States that he started having chest pain, worse with lying flat.  Also with abdominal pain and nausea, and had several episodes of vomiting.  Yesterday morning had a near syncopal episode.  He has been taking torsemide for heart failure.  He came to ED for evaluation yesterday.  In the ED, initial vital signs notable for BP 83/50, SPO2 100% on room air, pulse 82.  Labs notable for creatinine 2.2 (up from 1.1 on 8/16), potassium 5.8, sodium 133, hemoglobin 16.2, WBC 6.7, lactate 2.5 > 5.3 > 3.0 > 3.8 > 2.0, BNP 46, high-sensitivity troponin 22 > 23, TSH 1.3.  EKG showed normal sinus rhythm, rate 84, PACs, LVH with repolarization abnormalities, QTC 564.  Received IV fluids, with improvement in creatinine (2.2->1.8).  Echocardiogram done today shows improvement in LVEF (15% to 40-45%, global hypokinesis), mild LV dilatation, mild LVH, grade 1 diastolic dysfunction, normal RV function, mild MR, IVC small/collapsible.  UDS positive for cocaine.  Review of care everywhere shows that he underwent Myoview in [place] on 04/23/2020 which was nearly nondiagnostic due to significant motion, but no definite evidence of ischemia or infarction, EF 31%, with transient 3 times daily.  An echo on 04/24/2020 showed EF 35 to 40%, global hypokinesis.   Coronary CTA was done on 04/25/2020, which showed moderate to severe RCA, LAD, and circumflex disease.  Functional assessment was recommended.  He left AMA.   Past Medical History:  Diagnosis Date  . Asthma   . Bipolar 1 disorder (HCC)   . CHF (congestive heart failure) (HCC)   . Depression   . Homelessness   . Hypertension   . QT prolongation   . Schizoaffective disorder (HCC)     History reviewed. No pertinent surgical history.     Inpatient Medications: Scheduled Meds: . divalproex  500 mg Oral Daily  . enoxaparin (LOVENOX) injection  40 mg Subcutaneous Q24H  . gabapentin  600 mg Oral TID  . lidocaine  1 patch Transdermal Q24H  . potassium chloride  40 mEq Oral Q4H  . sodium chloride flush  3 mL Intravenous Q12H   Continuous Infusions: . lactated ringers     PRN Meds: albuterol, alum & mag hydroxide-simeth, nitroGLYCERIN, polyethylene glycol  Allergies:    Allergies  Allergen Reactions  . Ibuprofen Anaphylaxis  . Acetaminophen Hives, Nausea And Vomiting and Rash    Pt overdosed on bottle of APAP 05/06/16     Social History:   Social History   Socioeconomic History  . Marital status: Single    Spouse name: Not on file  . Number of children: Not on file  . Years of education: Not on file  . Highest education level: Not on file  Occupational History  . Not on file  Tobacco Use  . Smoking status: Current Every Day Smoker  Years: 30.00    Types: Cigarettes  . Smokeless tobacco: Never Used  Vaping Use  . Vaping Use: Never used  Substance and Sexual Activity  . Alcohol use: Yes    Comment: infrequently  . Drug use: Yes    Types: Cocaine, Marijuana  . Sexual activity: Yes    Birth control/protection: None  Other Topics Concern  . Not on file  Social History Narrative  . Not on file   Social Determinants of Health   Financial Resource Strain:   . Difficulty of Paying Living Expenses: Not on file  Food Insecurity:   . Worried About Brewing technologist in the Last Year: Not on file  . Ran Out of Food in the Last Year: Not on file  Transportation Needs:   . Lack of Transportation (Medical): Not on file  . Lack of Transportation (Non-Medical): Not on file  Physical Activity:   . Days of Exercise per Week: Not on file  . Minutes of Exercise per Session: Not on file  Stress:   . Feeling of Stress : Not on file  Social Connections:   . Frequency of Communication with Friends and Family: Not on file  . Frequency of Social Gatherings with Friends and Family: Not on file  . Attends Religious Services: Not on file  . Active Member of Clubs or Organizations: Not on file  . Attends Banker Meetings: Not on file  . Marital Status: Not on file  Intimate Partner Violence:   . Fear of Current or Ex-Partner: Not on file  . Emotionally Abused: Not on file  . Physically Abused: Not on file  . Sexually Abused: Not on file    Family History:    Family History  Problem Relation Age of Onset  . Drug abuse Mother   . Suicidality Mother      ROS:  Please see the history of present illness.   All other ROS reviewed and negative.     Physical Exam/Data:   Vitals:   07/21/20 2022 07/21/20 2058 07/22/20 0137 07/22/20 0904  BP:  112/71 110/76 106/68  Pulse:  70 70 76  Resp:  20 18 20   Temp:  97.7 F (36.5 C) 97.6 F (36.4 C) 98.2 F (36.8 C)  TempSrc:  Oral Oral Oral  SpO2:  100% 98% 100%  Weight: 89.4 kg     Height: 5\' 9"  (1.753 m)       Intake/Output Summary (Last 24 hours) at 07/22/2020 1202 Last data filed at 07/22/2020 0400 Gross per 24 hour  Intake 1575.6 ml  Output --  Net 1575.6 ml   Last 3 Weights 07/21/2020 07/16/2020 07/07/2020  Weight (lbs) 197 lb 1.6 oz 200 lb 200 lb  Weight (kg) 89.404 kg 90.719 kg 90.719 kg  Some encounter information is confidential and restricted. Go to Review Flowsheets activity to see all data.     Body mass index is 29.11 kg/m.  General:  in no acute distress HEENT:  normal Lymph: no adenopathy Neck: no JVD Endocrine:  No thryomegaly Vascular: No carotid bruits Cardiac:  normal S1, S2; RRR; no murmur Lungs:  clear to auscultation bilaterally, no wheezing, rhonchi or rales  Abd: soft, nontender, no hepatomegaly  Ext: no edema Musculoskeletal:  No deformities, BUE and BLE strength normal and equal Skin: warm and dry  Neuro:   no focal abnormalities noted Psych:  Normal affect   EKG:  The EKG was personally reviewed and demonstrates: normal sinus rhythm,  rate 84, PACs, LVH with repolarization abnormalities, QTC 564. Telemetry:  Telemetry was personally reviewed and demonstrates: Normal sinus rhythm, rate 60s to 70s, PVCs  Relevant CV Studies: Echo 07/21/21: 1. Since the last study on 03/28/2019 LVEF has improved from 15% to 40-45%  with diffuse hypokinesis.  2. Left ventricular ejection fraction, by estimation, is 40 to 45%. The  left ventricle has mildly decreased function. The left ventricle has no  regional wall motion abnormalities. The left ventricular internal cavity  size was mildly dilated. There is  mild concentric left ventricular hypertrophy. Left ventricular diastolic  parameters are consistent with Grade I diastolic dysfunction (impaired  relaxation).  3. Right ventricular systolic function is normal. The right ventricular  size is normal.  4. The mitral valve is normal in structure. Mild mitral valve  regurgitation. No evidence of mitral stenosis.  5. The aortic valve is normal in structure. Aortic valve regurgitation is  not visualized. Mild to moderate aortic valve sclerosis/calcification is  present, without any evidence of aortic stenosis.  6. The inferior vena cava is normal in size with greater than 50%  respiratory variability, suggesting right atrial pressure of 3 mmHg.   Laboratory Data:  High Sensitivity Troponin:   Recent Labs  Lab 07/21/20 1958 07/21/20 2152  TROPONINIHS 22* 23*     Chemistry Recent Labs   Lab 07/16/20 0233 07/21/20 1159 07/22/20 0108  NA 139 133* 133*  K 4.3 5.8* 3.2*  CL 105 90* 94*  CO2 20* 26 27  GLUCOSE 123* 99 133*  BUN 18 29* 34*  CREATININE 1.05 2.22* 1.82*  CALCIUM 9.6 9.4 8.9  GFRNONAA >60 33* 42*  GFRAA >60 38* 48*  ANIONGAP 14 17* 12    Recent Labs  Lab 07/16/20 0233 07/21/20 1159  PROT 7.9 RESULTS UNAVAILABLE DUE TO INTERFERING SUBSTANCE  ALBUMIN 4.5 4.3  AST 27 58*  ALT 24 26  ALKPHOS 68 72  BILITOT 0.8 2.3*   Hematology Recent Labs  Lab 07/16/20 0233 07/21/20 1159 07/22/20 0108  WBC 8.7 6.7 6.3  RBC 4.49 5.02 4.41  HGB 14.9 16.2 14.4  HCT 43.7 47.9 42.2  MCV 97.3 95.4 95.7  MCH 33.2 32.3 32.7  MCHC 34.1 33.8 34.1  RDW 14.7 14.4 14.1  PLT 269 299 235   BNP Recent Labs  Lab 07/21/20 1347  BNP 46.3    DDimer No results for input(s): DDIMER in the last 168 hours.   Radiology/Studies:  CT Abdomen Pelvis Wo Contrast  Result Date: 07/21/2020 CLINICAL DATA:  Generalized abdominal pain, nausea and vomiting. EXAM: CT ABDOMEN AND PELVIS WITHOUT CONTRAST TECHNIQUE: Multidetector CT imaging of the abdomen and pelvis was performed following the standard protocol without IV contrast. COMPARISON:  None. FINDINGS: Lower chest: No significant pulmonary nodules or acute consolidative airspace disease. Hepatobiliary: Normal liver size. No liver mass. Normal gallbladder with no radiopaque cholelithiasis. No biliary ductal dilatation. Pancreas: Normal, with no mass or duct dilation. Spleen: Normal size. No mass. Adrenals/Urinary Tract: Normal adrenals. No renal stones. No hydronephrosis. No contour deforming renal masses. Normal bladder. Stomach/Bowel: Normal non-distended stomach. Normal caliber small bowel with no small bowel wall thickening. Appendix not discretely visualized. No pericecal inflammatory changes. Normal large bowel with no diverticulosis, large bowel wall thickening or pericolonic fat stranding. Vascular/Lymphatic: Atherosclerotic  nonaneurysmal abdominal aorta. No pathologically enlarged lymph nodes in the abdomen or pelvis. Reproductive: Normal size prostate. Other: No pneumoperitoneum, ascites or focal fluid collection. Musculoskeletal: No aggressive appearing focal osseous lesions. Mild thoracolumbar spondylosis.  IMPRESSION: 1. No acute abnormality. No evidence of bowel obstruction or acute bowel inflammation. 2. Aortic Atherosclerosis (ICD10-I70.0). Electronically Signed   By: Delbert Phenix M.D.   On: 07/21/2020 13:42   DG Chest Port 1 View  Result Date: 07/21/2020 CLINICAL DATA:  Acute shortness of breath. EXAM: PORTABLE CHEST 1 VIEW COMPARISON:  07/16/2020 and prior studies FINDINGS: The cardiomediastinal silhouette is unremarkable. There is no evidence of focal airspace disease, pulmonary edema, suspicious pulmonary nodule/mass, pleural effusion, or pneumothorax. No acute bony abnormalities are identified. IMPRESSION: No active disease. Electronically Signed   By: Harmon Pier M.D.   On: 07/21/2020 13:59   ECHOCARDIOGRAM COMPLETE  Result Date: 07/22/2020    ECHOCARDIOGRAM REPORT   Patient Name:   [name] Date of Exam: 07/22/2020 Medical Rec #:  [id number]      Height:       69.0 in Accession #:    [id number]     Weight:       197.1 lb Date of Birth:  [birth year]      BSA:          2.053 m Patient Age:    54 years       BP:           106/68 mmHg Patient Gender: M              HR:           75 bpm. Exam Location:  Inpatient Procedure: 2D Echo, Cardiac Doppler and Color Doppler Indications:    R53.83 Fatigue; I50.9* Heart failure (unspecified)  History:        Patient has prior history of Echocardiogram examinations, most                 recent 03/28/2019.  Sonographer:    Roosvelt Maser RDCS Referring Phys: [id number] [facility] IMPRESSIONS  1. Since the last study on 03/28/2019 LVEF has improved from 15% to 40-45% with diffuse hypokinesis.  2. Left ventricular ejection fraction, by estimation, is 40 to 45%. The left ventricle  has mildly decreased function. The left ventricle has no regional wall motion abnormalities. The left ventricular internal cavity size was mildly dilated. There is mild concentric left ventricular hypertrophy. Left ventricular diastolic parameters are consistent with Grade I diastolic dysfunction (impaired relaxation).  3. Right ventricular systolic function is normal. The right ventricular size is normal.  4. The mitral valve is normal in structure. Mild mitral valve regurgitation. No evidence of mitral stenosis.  5. The aortic valve is normal in structure. Aortic valve regurgitation is not visualized. Mild to moderate aortic valve sclerosis/calcification is present, without any evidence of aortic stenosis.  6. The inferior vena cava is normal in size with greater than 50% respiratory variability, suggesting right atrial pressure of 3 mmHg. FINDINGS  Left Ventricle: Left ventricular ejection fraction, by estimation, is 40 to 45%. The left ventricle has mildly decreased function. The left ventricle has no regional wall motion abnormalities. The left ventricular internal cavity size was mildly dilated. There is mild concentric left ventricular hypertrophy. Left ventricular diastolic parameters are consistent with Grade I diastolic dysfunction (impaired relaxation). Normal left ventricular filling pressure. Right Ventricle: The right ventricular size is normal. No increase in right ventricular wall thickness. Right ventricular systolic function is normal. Left Atrium: Left atrial size was normal in size. Right Atrium: Right atrial size was normal in size. Pericardium: There is no evidence of pericardial effusion. Mitral Valve: The mitral valve is normal in structure. Normal  mobility of the mitral valve leaflets. Mild mitral valve regurgitation. No evidence of mitral valve stenosis. Tricuspid Valve: The tricuspid valve is normal in structure. Tricuspid valve regurgitation is mild . No evidence of tricuspid stenosis.  Aortic Valve: The aortic valve is normal in structure. Aortic valve regurgitation is not visualized. Mild to moderate aortic valve sclerosis/calcification is present, without any evidence of aortic stenosis. Pulmonic Valve: The pulmonic valve was normal in structure. Pulmonic valve regurgitation is not visualized. No evidence of pulmonic stenosis. Aorta: The aortic root is normal in size and structure. Venous: The inferior vena cava is normal in size with greater than 50% respiratory variability, suggesting right atrial pressure of 3 mmHg. IAS/Shunts: No atrial level shunt detected by color flow Doppler.  LEFT VENTRICLE PLAX 2D LVIDd:         5.80 cm      Diastology LVIDs:         4.90 cm      LV e' lateral:   10.20 cm/s LV PW:         1.90 cm      LV E/e' lateral: 8.4 LV IVS:        1.00 cm      LV e' medial:    7.51 cm/s LVOT diam:     2.10 cm      LV E/e' medial:  11.4 LV SV:         47 LV SV Index:   23 LVOT Area:     3.46 cm                              3D Volume EF: LV Volumes (MOD)            3D EF:        46 % LV vol d, MOD A2C: 197.0 ml LV EDV:       221 ml LV vol d, MOD A4C: 122.0 ml LV ESV:       120 ml LV vol s, MOD A2C: 145.0 ml LV SV:        101 ml LV vol s, MOD A4C: 60.8 ml LV SV MOD A2C:     52.0 ml LV SV MOD A4C:     122.0 ml LV SV MOD BP:      61.9 ml RIGHT VENTRICLE RV Basal diam:  3.50 cm LEFT ATRIUM             Index       RIGHT ATRIUM           Index LA diam:        3.80 cm 1.85 cm/m  RA Area:     18.60 cm LA Vol (A2C):   98.8 ml 48.12 ml/m RA Volume:   53.60 ml  26.11 ml/m LA Vol (A4C):   51.9 ml 25.28 ml/m LA Biplane Vol: 73.5 ml 35.80 ml/m  AORTIC VALVE LVOT Vmax:   89.20 cm/s LVOT Vmean:  60.500 cm/s LVOT VTI:    0.137 m  AORTA Ao Root diam: 3.30 cm MITRAL VALVE MV Area (PHT): 3.36 cm     SHUNTS MV Decel Time: 226 msec     Systemic VTI:  0.14 m MV E velocity: 85.30 cm/s   Systemic Diam: 2.10 cm MV A velocity: 120.00 cm/s MV E/A ratio:  0.71 Tobias Alexander MD Electronically signed  by Tobias Alexander MD Signature Date/Time: 07/22/2020/11:21:02 AM    Final    {  Assessment and Plan:    CAD: Coronary CTA showed moderate to severe RCA, LAD, and circumflex disease.  Functional assessment was recommended.  He left AMA.  Notably Myoview was previously attempted on 04/23/2020, but nearly nondiagnostic due to significant motion (though no definite evidence of ischemia or infarction was seen).  He does report atypical chest pain. -LDL 76 on 06/02/2020 in [place].  Will start atorvastatin 20 mg daily -Likely not a good candidate for intervention due to history of noncompliance and continued substance use (UDS positive for cocaine).  Discussed with patient, would favor starting with noninvasive evaluation with Lexiscan Myoview.  If high risk findings will consider invasive evaluation, but patient will need to demonstrate compliance with outpatient appointments/meds and abstain from cocaine use  Chronic systolic and diastolic heart failure: EF previously 15% on 03/28/2019.  Echo today shows significant improvement (EF 40 to 45%).  -Appears euvolemic, would hold diuresis as suspect he had been overdiuresed leading to near syncopal episode -Hold lisinopril given AKI -Hold carvedilol given soft pressures for now  Near syncope/AKI: Suspect due to dehydration in setting of overdiuresis and vomiting.  Continue to hold diuresis.  Continue to monitor on telemetry for arrhythmia.  Echo shows improved LVEF to 40 to 45%.  QT prolongation: Maintain potassium greater than 4, magnesium greater than 2  For questions or updates, please contact [facility] Please consult [email or web address] for contact info under    Signed, Little Ishikawa, MD  07/22/2020 12:02 PM

## 2020-07-22 NOTE — Evaluation (Signed)
Physical Therapy Evaluation Patient Details Name: [name] MRN: [id number] DOB: September 24, 1966 Today's Date: 07/22/2020   History of Present Illness  [name] is a 54 yo M w/ PMH of HTN, COPD, Asthma, HFrEF (EF 15% on prior echo), htn, Cocaine use and tobacco use presenting to [facility] w/ complaint of light-headedness. He was in his usual state of health until 2 weeks ago when he began to have intermittent chest pain that moves from right to left with worsening severity when laying flat. He also noticed worsening orthopnea and abdominal pain with nausea. Last night he had 1 episode of NBNB emesis and this morning he had a near syncopal episode when he almost fell after feeling light-headed so he came to [facility] for evaluation. He mentions he is not vaccinated and denies any sick contact. He states his takes his home medications except for gabapentin as prescribed and his last drug use was 7 days prior.  Clinical Impression  Limited evaluation with minimal activity or purposeful interaction due to pt's agitation and belligerence.   Pt showed to be independent in a home-like environment.  He appeared to be light headed, would not allow follow up on BP or sats in sitting or standing.  He escalated to a belligerent level where nothing could be accomplished and session was ended when he stopped acknowledging the therapist.    Follow Up Recommendations No PT follow up    Equipment Recommendations  None recommended by PT    Recommendations for Other Services       Precautions / Restrictions Precautions Precautions: None      Mobility  Bed Mobility Overal bed mobility: Modified Independent                Transfers Overall transfer level: Modified independent                  Ambulation/Gait Ambulation/Gait assistance: Supervision Gait Distance (Feet): 22 Feet Assistive device: None Gait Pattern/deviations: Step-through pattern   Gait velocity interpretation: 1.31 - 2.62  ft/sec, indicative of limited community ambulator General Gait Details: So erradic and quick with pt belligerent and resistive that I only could see that he is generally safe in a homelike environment.  Pt would not let me use a pulse oxymeter or monitoring.  All movement was generally steady.  Stairs            Wheelchair Mobility    Modified Rankin (Stroke Patients Only)       Balance Overall balance assessment: No apparent balance deficits (not formally assessed);Needs assistance Sitting-balance support: Feet supported;No upper extremity supported Sitting balance-Leahy Scale: Good     Standing balance support: During functional activity Standing balance-Leahy Scale: Fair                               Pertinent Vitals/Pain Pain Assessment:  (unable to determine.  pt got agitated before I could find ou)    Home Living Family/patient expects to be discharged to:: Shelter/Homeless                 Additional Comments: got belligerent when any questions asked.    Prior Function Level of Independence: Independent               Hand Dominance        Extremity/Trunk Assessment   Upper Extremity Assessment Upper Extremity Assessment: Overall WFL for tasks assessed    Lower Extremity Assessment Lower Extremity  Assessment: Overall WFL for tasks assessed       Communication   Communication: No difficulties  Cognition Arousal/Alertness: Awake/alert Behavior During Therapy: Agitated (belligerent) Overall Cognitive Status: Difficult to assess                                        General Comments      Exercises     Assessment/Plan    PT Assessment All further PT needs can be met in the next venue of care (MD's recommendations IF PT AGREES TO BE CALM and CIVIL)  PT Problem List Decreased mobility;Decreased activity tolerance       PT Treatment Interventions      PT Goals (Current goals can be found in the Care  Plan section)  Acute Rehab PT Goals Patient Stated Goal: did not participate PT Goal Formulation: All assessment and education complete, DC therapy    Frequency     Barriers to discharge        Co-evaluation               AM-PAC PT "6 Clicks" Mobility  Outcome Measure Help needed turning from your back to your side while in a flat bed without using bedrails?: None Help needed moving from lying on your back to sitting on the side of a flat bed without using bedrails?: None Help needed moving to and from a bed to a chair (including a wheelchair)?: None Help needed standing up from a chair using your arms (e.g., wheelchair or bedside chair)?: None Help needed to walk in hospital room?: None Help needed climbing 3-5 steps with a railing? : None 6 Click Score: 24    End of Session   Activity Tolerance: Treatment limited secondary to agitation;Other (comment) (pt escalated throughout the session.) Patient left: in bed;with call bell/phone within reach Nurse Communication: Mobility status PT Visit Diagnosis: Other abnormalities of gait and mobility (R26.89)    Time: 1330-1340 PT Time Calculation (min) (ACUTE ONLY): 10 min   Charges:   PT Evaluation $PT Eval Low Complexity: 1 Low          07/22/2020  Ginger Carne., PT Acute Rehabilitation Services [phone]  (pager) [phone]  (office)  Tessie Fass Hazleigh Mccleave 07/22/2020, 1:56 PM

## 2020-07-22 NOTE — Progress Notes (Signed)
  Date: 07/22/2020  Patient name: [name]  Medical record number: [id number]  Date of birth: 1966/06/18   I have seen and evaluated [name] and discussed their care with the Residency Team.  In brief, patient is a 54-year-old male with a past medical history of hypertension, COPD, asthma, chronic systolic heart failure, hypertension, cocaine use and tobacco use who presented to the ED with lightheadedness x1 day.  Patient states that over the last couple weeks he has noted intermittent chest pain which was atypical in nature beginning on the right and radiating to the left which worsened when he lays flat.  Patient also noted worsening shortness of breath with orthopnea.  On the night prior to his admission he developed 1 episode of nausea and vomiting and lightheaded on standing and came to the ED for further evaluation.  No syncope, no focal weakness, no tingling or numbness, no headache, no blurry vision, no fevers or chills, no palpitations, no diaphoresis.  Today, patient states that he still feels a little lightheaded on standing and has persistent orthopnea.  He also complains of intermittent chest pain.  Patient became upset when we attempted to discuss the effects of cocaine on his heart.  PMHx, Fam Hx, and/or Soc Hx : As per resident admit note  Vitals:   07/22/20 0137 07/22/20 0904  BP: 110/76 106/68  Pulse: 70 76  Resp: 18 20  Temp: 97.6 F (36.4 C) 98.2 F (36.8 C)  SpO2: 98% 100%   General: Awake alert oriented x3, NAD CVS: Regular rate and rhythm, normal heart sounds.  Lungs: CTA bilaterally Abdomen: Soft, nontender, nondistended, normoactive bowel sounds Extremities: No edema noted, nontender to palpation Psych: Normal mood and affect HEENT: Normocephalic, atraumatic Skin: Warm and dry  Assessment and Plan: I have seen and evaluated the patient as outlined above. I agree with the formulated Assessment and Plan as detailed in the residents' note, with the  following changes:   1.  Near syncope: -Patient presented to ED with lightheadedness x1 day in the setting of likely dehydration.  Patient is on diuretics and is currently homeless and had borderline blood pressures on admission with an elevated lactic acid and AKI (likely prerenal) consistent with hypovolemia.  He has no active signs of an underlying infection. -Blood pressure appears to have improved this morning.  We will continue to hold diuretics and antihypertensives for now. -We will recheck orthostatic vital signs today -Continue with IV fluids -PT to evaluate the patient today -Patient's creatinine is slowly improving and is down to 1.8 today from 2.2 yesterday.  We will continue with IV fluids and monitor BMP daily.  2.  Atypical chest pain in the setting of cocaine use: -Patient presented to the ED with atypical chest pain over the last couple weeks which has been intermittent in nature.  He does have a prior echo showing an EF of 35 to 40% at [place]. -EKG with T wave inversions in the inferior lateral leads -We will obtain 2D echo for further evaluation -Troponins are mildly elevated but have remained flat. -Would consider cardiology consult given EKG changes and atypical chest pain especially if echo shows worsening changes. -We will continue to monitor closely -We attempted to discuss cocaine use but patient became irritable and did not want to discuss this further  Earl Lagos, MD 8/22/202111:18 AM

## 2020-07-22 NOTE — Progress Notes (Signed)
   Subjective:  [name] is a 54 y.o. with PMH of cocaine use, HTN, HLD, systolic heart failure, and unstable housing admit for  on hospital day 0  Patient seen at bedside this AM. States he still feels light-headed, SOB. Reports orthopnea, cannot lay on back. Mentions chest pain similar to yesterday. Discussed hx of CAD, patient agreeable to follow-up. Has not previously seen doctor here. Patient agitated toward implication of cocaine causing heart failure  Objective:  Vital signs in last 24 hours: Vitals:   07/21/20 2022 07/21/20 2058 07/22/20 0137 07/22/20 0904  BP:  112/71 110/76 106/68  Pulse:  70 70 76  Resp:  20 18 20   Temp:  97.7 F (36.5 C) 97.6 F (36.4 C) 98.2 F (36.8 C)  TempSrc:  Oral Oral Oral  SpO2:  100% 98% 100%  Weight: 89.4 kg     Height: 5\' 9"  (1.753 m)      Gen: Well-developed, well nourished, NAD HEENT: NCAT head, hearing intact, EOMI, MMM Pulm: CTAB, no rales, no wheezes  Extm: ROM intact, No peripheral edema Skin: Dry, Warm, normal turgor  Neuro: AAOx3, Answers questions appropriately Psych: Normal mood and affect   Assessment/Plan:  Active Problems:   Near syncope  [name] is a 54 yo M w/ PMH of HTN, COPD, Asthma, HFrEF (EF 35-40% on prior echo), htn, Cocaine use and tobacco use admit for near-syncope due to dehydration in setting of diuretic use.  Near-syncope 2/2 dehydration BP improved this am to 106/68. Lactate trended down (3.8->2.9). Appear comfortably. Received 1.5L overnight. Symptoms resolving - Orthostatic vitals - Telemetry - PT eval - Hold anti-hypertensives, diuretics  Acute Kidney Injury Hyperkalemia On admission BUN 29, Creatinine 2.22. Improving with fluids. Hyperkalemia resolved K 5.8->3.2. Creatinine downtrended to 1.82. Not quite at baseline (1.05). Replenish with oral intake  - Fluid resuscitation as above - Start lokelma - Trend renal fx - Avoid nephrotoxic meds whena ble  Atypical Chest Pain Hx of  cocaine use Present w/ atypical chest pain. Had multiple ED visits for similar complaint. Constant, not associated with exertion. On chart review, admit at [name] on 05/2020. Stress test was performed which was deemed undiagnostic due to motion artifact. Cardiac CTA performed on 03/2020 showing multi-vessel disease. Unfortunately left AMA due to 'feeling disrespected' per notes from [place]. Overnight troponin stable at 22->23. Am EKG improved from last night with resolution of new T-wave inversions. Echocardiogram pending. Not in chest pain this am. - F/u echocardiogram  Chronic systolic heart failure Cocaine induced cardiomyopathy Noted to have prior echocardiogram showing EF of 35-40% from [name]. At the time left ama prior to further intervention. On lisinopril, torsemide, carvedilol per home med list. Appear hypovolemic on exam. BNP at 46.3. Attempted to counsel on cocaine cessation, but patient became irritable and refused to discuss further. - C/w to encourage cocaine cessation - Not in acute heart failure exacerbation - I/Os - Daily weights  COPD Tobacco use Has hx of >30 pack year smoking history. On Symbicort and albuterol rescue as needed. No significant wheezing on exam - C/w home meds  DVT prophx: Lovenox Diet: Regular Bowel: Senokot Code: Full  Prior to Admission Living Arrangement: unstable housing Anticipated Discharge Location: unstable housing Barriers to Discharge: Medical work-up Dispo: Anticipated discharge in approximately 0-1 day(s).   [place], MD 07/22/2020, 10:20 AM Pager: [phone] After 5pm on weekdays and 1pm on weekends: On Call Pager: [phone]

## 2020-07-23 DIAGNOSIS — I10 Essential (primary) hypertension: Secondary | ICD-10-CM

## 2020-07-23 LAB — GLUCOSE, CAPILLARY: Glucose-Capillary: 105 mg/dL — ABNORMAL HIGH (ref 70–99)

## 2020-07-23 MED ORDER — ALBUTEROL SULFATE (2.5 MG/3ML) 0.083% IN NEBU: 3.0000 mL | INHALATION_SOLUTION | Freq: Four times a day (QID) | RESPIRATORY_TRACT | Status: DC

## 2020-07-23 MED ORDER — UMECLIDINIUM BROMIDE 62.5 MCG/INH IN AEPB
1.0000 | INHALATION_SPRAY | Freq: Every day | RESPIRATORY_TRACT | Status: DC
  Filled 2020-07-23: qty 7

## 2020-07-23 MED ORDER — LIVING BETTER WITH HEART FAILURE BOOK: Freq: Once | Status: DC

## 2020-07-23 MED ORDER — UMECLIDINIUM BROMIDE 62.5 MCG/INH IN AEPB: 1.0000 | INHALATION_SPRAY | Freq: Every day | RESPIRATORY_TRACT | 0 refills | Status: DC

## 2020-07-23 MED ORDER — ISOSORBIDE MONONITRATE ER 30 MG PO TB24
30.0000 mg | ORAL_TABLET | Freq: Every day | ORAL | 0 refills | Status: DC
Start: 2020-07-24 — End: 2020-09-11

## 2020-07-23 MED ORDER — ALUM & MAG HYDROXIDE-SIMETH 200-200-20 MG/5ML PO SUSP: 15.0000 mL | Freq: Once | ORAL | Status: DC | PRN

## 2020-07-23 MED ORDER — ATORVASTATIN CALCIUM 20 MG PO TABS
20.0000 mg | ORAL_TABLET | Freq: Every day | ORAL | 0 refills | Status: DC
Start: 2020-07-24 — End: 2020-09-11

## 2020-07-23 MED ORDER — HYDRALAZINE HCL 25 MG PO TABS
25.0000 mg | ORAL_TABLET | Freq: Three times a day (TID) | ORAL | Status: DC
  Administered 2020-07-23: 25 mg via ORAL
  Filled 2020-07-23: qty 1

## 2020-07-23 MED ORDER — ISOSORBIDE MONONITRATE ER 30 MG PO TB24
30.0000 mg | ORAL_TABLET | Freq: Every day | ORAL | Status: DC
  Administered 2020-07-23: 30 mg via ORAL
  Filled 2020-07-23: qty 1

## 2020-07-23 MED ORDER — HYDRALAZINE HCL 25 MG PO TABS
25.0000 mg | ORAL_TABLET | Freq: Three times a day (TID) | ORAL | 0 refills | Status: DC
Start: 2020-07-23 — End: 2020-09-11

## 2020-07-23 MED FILL — INCRUSE ELLIPTA 62.5 MCG IN: 62.5 | 30 days supply | Qty: 30 | Fill #0

## 2020-07-23 MED FILL — ATORVASTATIN CALCIUM 20 MG: 20 | 30 days supply | Qty: 30 | Fill #0

## 2020-07-23 MED FILL — hydrALAZINE HCL 25 MG TABS: 25 | 30 days supply | Qty: 90 | Fill #0

## 2020-07-23 MED FILL — ISOSORBIDE MN ER 30 MG TAB: 30 | 30 days supply | Qty: 30 | Fill #0

## 2020-07-23 NOTE — TOC Transition Note (Signed)
Transition of Care [facility]) - CM/SW Discharge Note   Patient Details  Name: [name] MRN: [id number] Date of Birth: 1966/06/19  Transition of Care [facility]) CM/SW Contact:  Leone Haven, RN Phone Number: 07/23/2020, 2:15 PM   Clinical Narrative:    Patient is for dc home today, NCM assisted with Match Letter for meds, he states he has transport home.  NCM gave him substance abuse resources also.  NCM asked who he lived with, he states that is personal, he is going to [place].  Previous note states he is homeless.  TOC filled meds for him.   Final next level of care: Home/Self Care Barriers to Discharge: No Barriers Identified   Patient Goals and CMS Choice Patient states their goals for this hospitalization and ongoing recovery are:: get better   Choice offered to / list presented to : NA  Discharge Placement                       Discharge Plan and Services   Discharge Planning Services: CM Consult Post Acute Care Choice: NA            DME Agency: NA       HH Arranged: NA          Social Determinants of Health (SDOH) Interventions     Readmission Risk Interventions Readmission Risk Prevention Plan 07/23/2020 04/06/2019 03/29/2019  Transportation Screening Complete Complete Complete  PCP or Specialist Appt within 3-5 Days Complete Complete Patient refused  HRI or Home Care Consult Complete Complete -  Social Work Consult for Recovery Care Planning/Counseling Complete Complete Complete  Palliative Care Screening Not Applicable Not Applicable Not Applicable  Medication Review Oceanographer) Complete Complete Complete  Some recent data might be hidden

## 2020-07-23 NOTE — TOC Transition Note (Signed)
Transition of Care [facility]) - CM/SW Discharge Note   Patient Details  Name: Chin Wachter MRN: [id number] Date of Birth: Dec 31, 1965  Transition [facility] Care [facility]) CM/SW Contact:  Carmina Miller, LCSWA Phone Number: 07/23/2020, 2:36 PM   Clinical Narrative:    CSW spoke with patient in ref to substance abuse, patient requested bus pass to get to [facility], CSW provided them to patient.    Final next level of care: Home/Self Care Barriers to Discharge: No Barriers Identified   Patient Goals and CMS Choice Patient states their goals for this hospitalization and ongoing recovery are:: get better   Choice offered to / list presented to : NA  Discharge Placement                       Discharge Plan and Services   Discharge Planning Services: CM Consult Post Acute Care Choice: NA            DME Agency: NA       HH Arranged: NA          Social Determinants of Health (SDOH) Interventions     Readmission Risk Interventions Readmission Risk Prevention Plan 07/23/2020 04/06/2019 03/29/2019  Transportation Screening Complete Complete Complete  PCP or Specialist Appt within 3-5 Days Complete Complete Patient refused  HRI or Home Care Consult Complete Complete -  Social Work Consult for Recovery Care Planning/Counseling Complete Complete Complete  Palliative Care Screening Not Applicable Not Applicable Not Applicable  Medication Review Oceanographer) Complete Complete Complete  Some recent data might be hidden

## 2020-07-23 NOTE — Progress Notes (Signed)
Heart Failure Stewardship Pharmacist Progress Note   PCP: Medicine, [facility] Family PCP-Cardiologist: No primary care provider on file.    HPI:  54 yo M with a PMH significatn for CAD, chronic combined systolic and diastolic heart failure, COPD, asthma, HTN, cocaine use, and tobacco use. He was admitted on 07/21/20 for near-syncope thought to be due to dehydration. LVEF on ECHO from 03/2019 was down to 15%. Repeat ECHO on 07/22/20 showed improved LVEF up to 40-45%.  Current HF Medications: Hydralazine 25 mg TID Imdur 30 mg daily  Prior to admission HF Medications: Torsemide 20 mg BID Carvedilol 3.125 mg BID Lisinopril 20 mg BID  Pertinent Lab Values:  Serum creatinine 1.82, BUN 34, Potassium 3.2, Sodium 133, BNP 46.3   Vital Signs:  Weight: 209 lbs (admission weight: 197 lbs)  Blood pressure: 130-170s/90-100s   Heart rate: 80-100s  Medication Assistance / Insurance Benefits Check: Does the patient have prescription insurance?  Yes Type of insurance plan: Medicaid  Does the patient qualify for medication assistance through manufacturers or grants?   Pending  Eligible grants and/or patient assistance programs: pending  Medication assistance applications in progress: none   Medication assistance applications approved: none Approved medication assistance renewals will be completed by: pending  Outpatient Pharmacy:  Prior to admission outpatient pharmacy: [facility] Is the patient willing to use [facility] TOC pharmacy at discharge? Yes Is the patient willing to transition their outpatient pharmacy to utilize a [facility] outpatient pharmacy?   No - patient states he moves around a lot and does not stay at one address    Assessment: 1. Chronic systolic CHF (EF 32-95%), due to cocaine induced cardiomyopathy. NYHA class II symptoms. Appears hypo-to-euvolemic on MD exam.  - Continue to hold diuretics at this time given overdiuresis/AKI. Restart at discharge. - Lisinopril on hold  with AKI. Consider starting Entresto pending improvement in renal function - Carvedilol on hold given ongoing cocaine use. If resuming beta-blocker therapy, carvedilol would be the safest option given unopposed alpha. - Agree with starting hydralazine 25 mg TID - Agree with starting imdur 30 mg daily   Plan: 1) Medication changes recommended at this time: - Pending improvement in renal function, start Entresto 24/26 mg BID  2) Patient assistance application(s): - None pending  3)  Education  - Patient has been educated on current HF medications (hydralazine, imdur, torsemide) and potential additions to HF medication regimen (Entresto, sprionolactone) - Patient verbalizes understanding that over the next few months, these medication doses may change and more medications may be added to optimize HF regimen - Patient has been educated on basic disease state pathophysiology and goals of therapy - Time spent (30 min)   Danae Orleans, PharmD, BCPS Heart Failure Stewardship Pharmacist Phone [phone] 07/23/2020       11:57 AM

## 2020-07-23 NOTE — TOC CAGE-AID Note (Deleted)
Transition of Care (TOC) - CAGE-AID Screening   Patient Details  Name: [name] MRN: [id number] Date of Birth: 03/30/1966  Transition of Care (TOC) CM/SW Contact:    Arlena Marsan B Tyanne Derocher, LCSWA Phone Number: 07/23/2020, 2:28 PM   Clinical Narrative:    CAGE-AID Screening:    Have You Ever Felt You Ought to Cut Down on Your Drinking or Drug Use?: Yes Have People Annoyed You By Critizing Your Drinking Or Drug Use?: No Have You Felt Bad Or Guilty About Your Drinking Or Drug Use?: No Have You Ever Had a Drink or Used Drugs First Thing In The Morning to Steady Your Nerves or to Get Rid of a Hangover?: No CAGE-AID Score: 1  Substance Abuse Education Offered: Yes  Substance abuse interventions:  (Patient stated he didn't need paperwork as he had already received it before.)       

## 2020-07-23 NOTE — TOC CAGE-AID Note (Signed)
Transition of Care [facility]) - CAGE-AID Screening   Patient Details  Name: [name] MRN: [id number] Date of Birth: 10/09/1966  Transition of Care [facility]) CM/SW Contact:    Carmina Miller, LCSWA Phone Number: 07/23/2020, 2:28 PM   Clinical Narrative:    CAGE-AID Screening:    Have You Ever Felt You Ought to Cut Down on Your Drinking or Drug Use?: Yes Have People Annoyed You By Critizing Your Drinking Or Drug Use?: No Have You Felt Bad Or Guilty About Your Drinking Or Drug Use?: No Have You Ever Had a Drink or Used Drugs First Thing In The Morning to Steady Your Nerves or to Get Rid of a Hangover?: No CAGE-AID Score: 1  Substance Abuse Education Offered: Yes  Substance abuse interventions:  (Patient stated he didn't need paperwork as he had already received it before.)

## 2020-07-23 NOTE — Progress Notes (Signed)
Pt refused morning labs and standing weight.  RN got bed weight on pt.

## 2020-07-23 NOTE — Discharge Summary (Addendum)
Name: [name] MRN: [id number] DOB: 1966-05-17 54 y.o. PCP: Medicine, [facility] Family  Date of Admission: 07/21/2020 11:41 AM Date of Discharge: 07/23/2020 Attending Physician: Earl Lagos, MD  Discharge Diagnosis: 1. Near-syncope 2/2 dehydration 2. AKI/Hyperkalemia 3. Atypical Chest Pain/Hx of cocaine use  Discharge Medications: Allergies as of 07/23/2020      Reactions   Ibuprofen Anaphylaxis   Acetaminophen Hives, Nausea And Vomiting, Rash   Pt overdosed on bottle of APAP 05/06/16      Medication List    STOP taking these medications   carvedilol 3.125 MG tablet Commonly known as: Coreg   cloNIDine 0.1 MG tablet Commonly known as: CATAPRES   lisinopril 20 MG tablet Commonly known as: ZESTRIL     TAKE these medications   albuterol 108 (90 Base) MCG/ACT inhaler Commonly known as: VENTOLIN HFA Inhale 1-2 puffs into the lungs every 4 (four) hours as needed for shortness of breath.   ARIPiprazole 2 MG tablet Commonly known as: ABILIFY Take 1 tablet (2 mg total) by mouth at bedtime.   atorvastatin 20 MG tablet Commonly known as: LIPITOR Take 1 tablet (20 mg total) by mouth daily. Start taking on: July 24, 2020   divalproex 500 MG 24 hr tablet Commonly known as: Depakote ER Take 1 tablet (500 mg total) by mouth in the morning and at bedtime.   gabapentin 600 MG tablet Commonly known as: Neurontin Take 1 tablet (600 mg total) by mouth 3 (three) times daily.   hydrALAZINE 25 MG tablet Commonly known as: APRESOLINE Take 1 tablet (25 mg total) by mouth every 8 (eight) hours.   isosorbide mononitrate 30 MG 24 hr tablet Commonly known as: IMDUR Take 1 tablet (30 mg total) by mouth daily. Start taking on: July 24, 2020   torsemide 20 MG tablet Commonly known as: Demadex Take 1 tablet (20 mg total) by mouth 2 (two) times daily.   umeclidinium bromide 62.5 MCG/INH Aepb Commonly known as: INCRUSE ELLIPTA Inhale 1 puff into the lungs daily.        Disposition and follow-up:   [name] was discharged from [facility] in Stable condition.  At the hospital follow up visit please address:  1. Near-syncope 2/2 dehydration: Obtain a set of Orthostatic vitals. See if further BP medication adjustments need to be made.   AKI/Hyperkalemia: Check BMP to see if kidney function has returned to baseline.   Atypical Chest Pain/Hx of cocaine use: Encourage cessation of cocaine use. Inquire if follow up with Cardiology was done and what further workup needs to be done.   2.  Labs / imaging needed at time of follow-up: BMP  3.  Pending labs/ test needing follow-up: Blood Culture (No growth at 2 days)  Follow-up Appointments: Follow up with Cardiology for [facility] Course by problem list: 1. Near-syncope 2/2 dehydration: On admission was started on IV fluids. His blood pressure medications were stopped. His symptoms improved and he was restarted on alternative blood pressure medications. He was started on Hydralazine and Imdur. Was agitated and belligerent with PT, was unable to be further assessed for Orthostatics and Syncope symptoms.   AKI/Hyperkalemia: On admission was started on IV fluids. His blood pressure medications were stopped. Kidney function began improving. Was given Lokelma and hyperkalemia resolved.   Atypical Chest Pain/Hx of cocaine use: Cardiac CTA performed on 03/2020 showing multi-vessel disease. Unfortunately left AMA due to 'feeling disrespected' per notes from [facility] in [place]. Due to history of  noncompliance and continued substance use recommended starting with noninvasive evaluation with Lexiscan Myoview. However due to COPD Cardiology recommended following up outpatient to get the scan.   Discharge Vitals:   BP 135/90 (BP Location: Left Arm)   Pulse 88   Temp 98.3 F (36.8 C) (Oral)   Resp 20   Ht 5\' 9"  (1.753 m)   Wt 94.9 kg   SpO2 96%   BMI 30.90 kg/m    Pertinent Labs, Studies, and Procedures:  BMP Latest Ref Rng & Units 07/22/2020 07/21/2020 07/16/2020  Glucose 70 - 99 mg/dL 07/18/2020) 99 008(Q)  BUN 6 - 20 mg/dL 761(P) 50(D) 18  Creatinine 0.61 - 1.24 mg/dL 32(I) 7.12(W) 5.80(D  Sodium 135 - 145 mmol/L 133(L) 133(L) 139  Potassium 3.5 - 5.1 mmol/L 3.2(L) 5.8(H) 4.3  Chloride 98 - 111 mmol/L 94(L) 90(L) 105  CO2 22 - 32 mmol/L 27 26 20(L)  Calcium 8.9 - 10.3 mg/dL 8.9 9.4 9.6   CBC Latest Ref Rng & Units 07/22/2020 07/21/2020 07/16/2020  WBC 4.0 - 10.5 K/uL 6.3 6.7 8.7  Hemoglobin 13.0 - 17.0 g/dL 07/18/2020 38.2 50.5  Hematocrit 39 - 52 % 42.2 47.9 43.7  Platelets 150 - 400 K/uL 235 299 269    Echo: LV EF 40-45%. Mild concentric left ventricular hypertrophy. Grade 1 diastolic dysfunction. Mild to Moderate aortic valve calcification. All other structures visualized grossly normal.   Discharge Instructions: Discharge Instructions    Call MD for:  difficulty breathing, headache or visual disturbances   Complete by: As directed    Call MD for:  extreme fatigue   Complete by: As directed    Call MD for:  hives   Complete by: As directed    Call MD for:  persistant dizziness or light-headedness   Complete by: As directed    Call MD for:  persistant nausea and vomiting   Complete by: As directed    Call MD for:  redness, tenderness, or signs of infection (pain, swelling, redness, odor or green/yellow discharge around incision site)   Complete by: As directed    Call MD for:  severe uncontrolled pain   Complete by: As directed    Call MD for:  temperature >100.4   Complete by: As directed    Diet - low sodium heart healthy   Complete by: As directed    Discharge instructions   Complete by: As directed    Dear [name], It was a pleasure taking care of you. You were admitted for near syncope and AKI. We have changed your blood pressure medications to better control your pressure without decreasing it too much. We gave you IV fluids to  help improve your kidney function. Due to your wheezing we were not able to do a Lexiscan please follow up with Cardiology outpatient. Please try to abstain from illegal substance use.  Thank you   Increase activity slowly   Complete by: As directed       Signed: Mayford Knife, MD 07/23/2020, 1:57 PM   Pager: [phone]

## 2020-07-23 NOTE — Progress Notes (Signed)
Progress Note  Patient Name: [name] Date of Encounter: 8/23/20[facility] Cardiologist: No primary care provider on file.   Subjective   Reports dyspnea and wheezing this morning.  Refused AM labs  Inpatient Medications    Scheduled Meds: . albuterol  3 mL Inhalation Q6H  . atorvastatin  20 mg Oral Daily  . divalproex  500 mg Oral Daily  . enoxaparin (LOVENOX) injection  40 mg Subcutaneous Q24H  . gabapentin  600 mg Oral TID  . lidocaine  1 patch Transdermal Q24H  . sodium chloride flush  3 mL Intravenous Q12H   Continuous Infusions:  PRN Meds: albuterol, alum & mag hydroxide-simeth, alum & mag hydroxide-simeth, nitroGLYCERIN, polyethylene glycol   Vital Signs    Vitals:   07/23/20 0008 07/23/20 0423 07/23/20 0454 07/23/20 0500  BP: (!) 147/89 (!) 171/100    Pulse: 71 64    Resp: 19 19    Temp: 98.1 F (36.7 C) 98.6 F (37 C)    TempSrc: Oral     SpO2: 100% 100% 99%   Weight:    94.9 kg  Height:        Intake/Output Summary (Last 24 hours) at 07/23/2020 0842 Last data filed at 07/23/2020 0424 Gross per 24 hour  Intake 2151.01 ml  Output 975 ml  Net 1176.01 ml   Last 3 Weights 07/23/2020 07/23/2020 07/21/2020  Weight (lbs) 209 lb 3.5 oz (No Data) 197 lb 1.6 oz  Weight (kg) 94.9 kg (No Data) 89.404 kg  Some encounter information is confidential and restricted. Go to Review Flowsheets activity to see all data.      Telemetry    NSR, rate 60-70s - Personally Reviewed  ECG    No new ECG - Personally Reviewed  Physical Exam   GEN: No acute distress.   Neck: No JVD Cardiac: RRR, no murmurs, rubs, or gallops.  Respiratory: Expiratory wheezing GI: Soft, nontender, non-distended  MS: No edema; No deformity. Neuro:  Nonfocal  Psych: Normal affect   Labs    High Sensitivity Troponin:   Recent Labs  Lab 07/21/20 1958 07/21/20 2152  TROPONINIHS 22* 23*      Chemistry Recent Labs  Lab 07/21/20 1159 07/22/20 0108  NA 133* 133*   K 5.8* 3.2*  CL 90* 94*  CO2 26 27  GLUCOSE 99 133*  BUN 29* 34*  CREATININE 2.22* 1.82*  CALCIUM 9.4 8.9  PROT RESULTS UNAVAILABLE DUE TO INTERFERING SUBSTANCE  --   ALBUMIN 4.3  --   AST 58*  --   ALT 26  --   ALKPHOS 72  --   BILITOT 2.3*  --   GFRNONAA 33* 42*  GFRAA 38* 48*  ANIONGAP 17* 12     Hematology Recent Labs  Lab 07/21/20 1159 07/22/20 0108  WBC 6.7 6.3  RBC 5.02 4.41  HGB 16.2 14.4  HCT 47.9 42.2  MCV 95.4 95.7  MCH 32.3 32.7  MCHC 33.8 34.1  RDW 14.4 14.1  PLT 299 235    BNP Recent Labs  Lab 07/21/20 1347  BNP 46.3     DDimer No results for input(s): DDIMER in the last 168 hours.   Radiology    CT Abdomen Pelvis Wo Contrast  Result Date: 07/21/2020 CLINICAL DATA:  Generalized abdominal pain, nausea and vomiting. EXAM: CT ABDOMEN AND PELVIS WITHOUT CONTRAST TECHNIQUE: Multidetector CT imaging of the abdomen and pelvis was performed following the standard protocol without IV contrast. COMPARISON:  None. FINDINGS: Lower  chest: No significant pulmonary nodules or acute consolidative airspace disease. Hepatobiliary: Normal liver size. No liver mass. Normal gallbladder with no radiopaque cholelithiasis. No biliary ductal dilatation. Pancreas: Normal, with no mass or duct dilation. Spleen: Normal size. No mass. Adrenals/Urinary Tract: Normal adrenals. No renal stones. No hydronephrosis. No contour deforming renal masses. Normal bladder. Stomach/Bowel: Normal non-distended stomach. Normal caliber small bowel with no small bowel wall thickening. Appendix not discretely visualized. No pericecal inflammatory changes. Normal large bowel with no diverticulosis, large bowel wall thickening or pericolonic fat stranding. Vascular/Lymphatic: Atherosclerotic nonaneurysmal abdominal aorta. No pathologically enlarged lymph nodes in the abdomen or pelvis. Reproductive: Normal size prostate. Other: No pneumoperitoneum, ascites or focal fluid collection. Musculoskeletal: No  aggressive appearing focal osseous lesions. Mild thoracolumbar spondylosis. IMPRESSION: 1. No acute abnormality. No evidence of bowel obstruction or acute bowel inflammation. 2. Aortic Atherosclerosis (ICD10-I70.0). Electronically Signed   By: Delbert Phenix M.D.   On: 07/21/2020 13:42   DG Chest Port 1 View  Result Date: 07/21/2020 CLINICAL DATA:  Acute shortness of breath. EXAM: PORTABLE CHEST 1 VIEW COMPARISON:  07/16/2020 and prior studies FINDINGS: The cardiomediastinal silhouette is unremarkable. There is no evidence of focal airspace disease, pulmonary edema, suspicious pulmonary nodule/mass, pleural effusion, or pneumothorax. No acute bony abnormalities are identified. IMPRESSION: No active disease. Electronically Signed   By: Harmon Pier M.D.   On: 07/21/2020 13:59   ECHOCARDIOGRAM COMPLETE  Result Date: 07/22/2020    ECHOCARDIOGRAM REPORT   Patient Name:   [name] Date of Exam: 07/22/2020 Medical Rec #:  [id number]      Height:       69.0 in Accession #:    [id number]     Weight:       197.1 lb Date of Birth:  Jan 21, 1966      BSA:          2.053 m Patient Age:    54 years       BP:           106/68 mmHg Patient Gender: M              HR:           75 bpm. Exam Location:  Inpatient Procedure: 2D Echo, Cardiac Doppler and Color Doppler Indications:    R53.83 Fatigue; I50.9* Heart failure (unspecified)  History:        Patient has prior history of Echocardiogram examinations, most                 recent 03/28/2019.  Sonographer:    Roosvelt Maser RDCS Referring Phys: [id number] [facility] NARENDRA IMPRESSIONS  1. Since the last study on 03/28/2019 LVEF has improved from 15% to 40-45% with diffuse hypokinesis.  2. Left ventricular ejection fraction, by estimation, is 40 to 45%. The left ventricle has mildly decreased function. The left ventricle has no regional wall motion abnormalities. The left ventricular internal cavity size was mildly dilated. There is mild concentric left ventricular hypertrophy. Left  ventricular diastolic parameters are consistent with Grade I diastolic dysfunction (impaired relaxation).  3. Right ventricular systolic function is normal. The right ventricular size is normal.  4. The mitral valve is normal in structure. Mild mitral valve regurgitation. No evidence of mitral stenosis.  5. The aortic valve is normal in structure. Aortic valve regurgitation is not visualized. Mild to moderate aortic valve sclerosis/calcification is present, without any evidence of aortic stenosis.  6. The inferior vena cava is normal in size with greater than 50%  respiratory variability, suggesting right atrial pressure of 3 mmHg. FINDINGS  Left Ventricle: Left ventricular ejection fraction, by estimation, is 40 to 45%. The left ventricle has mildly decreased function. The left ventricle has no regional wall motion abnormalities. The left ventricular internal cavity size was mildly dilated. There is mild concentric left ventricular hypertrophy. Left ventricular diastolic parameters are consistent with Grade I diastolic dysfunction (impaired relaxation). Normal left ventricular filling pressure. Right Ventricle: The right ventricular size is normal. No increase in right ventricular wall thickness. Right ventricular systolic function is normal. Left Atrium: Left atrial size was normal in size. Right Atrium: Right atrial size was normal in size. Pericardium: There is no evidence of pericardial effusion. Mitral Valve: The mitral valve is normal in structure. Normal mobility of the mitral valve leaflets. Mild mitral valve regurgitation. No evidence of mitral valve stenosis. Tricuspid Valve: The tricuspid valve is normal in structure. Tricuspid valve regurgitation is mild . No evidence of tricuspid stenosis. Aortic Valve: The aortic valve is normal in structure. Aortic valve regurgitation is not visualized. Mild to moderate aortic valve sclerosis/calcification is present, without any evidence of aortic stenosis. Pulmonic  Valve: The pulmonic valve was normal in structure. Pulmonic valve regurgitation is not visualized. No evidence of pulmonic stenosis. Aorta: The aortic root is normal in size and structure. Venous: The inferior vena cava is normal in size with greater than 50% respiratory variability, suggesting right atrial pressure of 3 mmHg. IAS/Shunts: No atrial level shunt detected by color flow Doppler.  LEFT VENTRICLE PLAX 2D LVIDd:         5.80 cm      Diastology LVIDs:         4.90 cm      LV e' lateral:   10.20 cm/s LV PW:         1.90 cm      LV E/e' lateral: 8.4 LV IVS:        1.00 cm      LV e' medial:    7.51 cm/s LVOT diam:     2.10 cm      LV E/e' medial:  11.4 LV SV:         47 LV SV Index:   23 LVOT Area:     3.46 cm                              3D Volume EF: LV Volumes (MOD)            3D EF:        46 % LV vol d, MOD A2C: 197.0 ml LV EDV:       221 ml LV vol d, MOD A4C: 122.0 ml LV ESV:       120 ml LV vol s, MOD A2C: 145.0 ml LV SV:        101 ml LV vol s, MOD A4C: 60.8 ml LV SV MOD A2C:     52.0 ml LV SV MOD A4C:     122.0 ml LV SV MOD BP:      61.9 ml RIGHT VENTRICLE RV Basal diam:  3.50 cm LEFT ATRIUM             Index       RIGHT ATRIUM           Index LA diam:        3.80 cm 1.85 cm/m  RA Area:  18.60 cm LA Vol (A2C):   98.8 ml 48.12 ml/m RA Volume:   53.60 ml  26.11 ml/m LA Vol (A4C):   51.9 ml 25.28 ml/m LA Biplane Vol: 73.5 ml 35.80 ml/m  AORTIC VALVE LVOT Vmax:   89.20 cm/s LVOT Vmean:  60.500 cm/s LVOT VTI:    0.137 m  AORTA Ao Root diam: 3.30 cm MITRAL VALVE MV Area (PHT): 3.36 cm     SHUNTS MV Decel Time: 226 msec     Systemic VTI:  0.14 m MV E velocity: 85.30 cm/s   Systemic Diam: 2.10 cm MV A velocity: 120.00 cm/s MV E/A ratio:  0.71 Tobias Alexander MD Electronically signed by Tobias Alexander MD Signature Date/Time: 07/22/2020/11:21:02 AM    Final     Cardiac Studies   Echo 07/21/21: 1. Since the last study on 03/28/2019 LVEF has improved from 15% to 40-45%  with diffuse hypokinesis.   2. Left ventricular ejection fraction, by estimation, is 40 to 45%. The  left ventricle has mildly decreased function. The left ventricle has no  regional wall motion abnormalities. The left ventricular internal cavity  size was mildly dilated. There is  mild concentric left ventricular hypertrophy. Left ventricular diastolic  parameters are consistent with Grade I diastolic dysfunction (impaired  relaxation).  3. Right ventricular systolic function is normal. The right ventricular  size is normal.  4. The mitral valve is normal in structure. Mild mitral valve  regurgitation. No evidence of mitral stenosis.  5. The aortic valve is normal in structure. Aortic valve regurgitation is  not visualized. Mild to moderate aortic valve sclerosis/calcification is  present, without any evidence of aortic stenosis.  6. The inferior vena cava is normal in size with greater than 50%  respiratory variability, suggesting right atrial pressure of 3 mmHg.   Patient Profile     54 y.o. male a hx of CAD, chronic combined systolic and diastolic heart failure, COPD, asthma, hypertension, cocaine use, tobacco use who is being seen today for the evaluation of CAD  Assessment & Plan    CAD: Coronary CTA in [place] on 04/25/20 showed moderate to severe RCA, LAD, and circumflex disease.  Functional assessment was recommended.  He left AMA.  Notably Myoview was previously attempted on 04/23/2020, but nearly nondiagnostic due to significant motion (though no definite evidence of ischemia or infarction was seen).  He does report atypical chest pain. -LDL 76 on 06/02/2020 in [place].  Will start atorvastatin 20 mg daily -Start imdur 30 mg daily -Likely not a good candidate for intervention due to history of noncompliance and continued substance use (UDS positive for cocaine).  Discussed with patient, would favor starting with noninvasive evaluation with Lexiscan Myoview.  If high risk findings will consider  invasive evaluation, but patient will need to demonstrate compliance with outpatient appointments/meds and abstain from cocaine use.  He is wheezing on exam today, will hold off on Lexiscan and plan outpatient f/u to consider ischemic evaluation  Chronic systolic and diastolic heart failure: EF previously 15% on 03/28/2019.  Echo today shows significant improvement (EF 40 to 45%).  -Appears euvolemic, would hold diuresis as suspect he had been overdiuresed leading to near syncopal episode -Hold lisinopril given AKI -Would hold off on beta-blockers given ongoing cocaine use -Recommend starting hydralazine 25 mg TID and imdur 30 mg daily  Near syncope/AKI: Suspect due to dehydration in setting of overdiuresis and vomiting.  Continue to hold diuresis.  Continue to monitor on telemetry for arrhythmia.  Echo shows  improved LVEF to 40 to 45%.  HTN: BP elevated to 171/100 this morning, will start hydralazine/imdur as above  QT prolongation: Maintain potassium greater than 4, magnesium greater than 2  For questions or updates, please contact [facility] Please consult [email or web address] for contact info under        Signed, Little Ishikawa, MD  07/23/2020, 8:42 AM

## 2020-07-23 NOTE — Progress Notes (Signed)
RN attempted to assess pt heart and lungs, bowel sounds during this time.  Pt refused during this time.  NT in the room to witness, pt refusing.  RN informed pt that refusal would be documented.

## 2020-07-23 NOTE — Progress Notes (Signed)
Subjective: Interviewed patient at bedside. Still reports continued productive cough and dizziness. Discussed results of TTE, plan for stress test today.  After being seen by Cardiology will get Lexiscan as follow up outpatient due to wheezing today.   Refused blood draws this morning.  Objective:  Vital signs in last 24 hours: Vitals:   07/22/20 1955 07/23/20 0008 07/23/20 0423 07/23/20 0454  BP: 133/76 (!) 147/89 (!) 171/100   Pulse: 77 71 64   Resp: 19 19 19    Temp: 98.4 F (36.9 C) 98.1 F (36.7 C) 98.6 F (37 C)   TempSrc: Oral Oral    SpO2: 100% 100% 100% 99%  Weight:      Height:       Physical Exam Vitals and nursing note reviewed.  Constitutional:      General: He is awake. He is not in acute distress.    Appearance: Normal appearance. He is well-developed. He is not ill-appearing or toxic-appearing.  HENT:     Head: Normocephalic and atraumatic.  Cardiovascular:     Rate and Rhythm: Normal rate and regular rhythm.     Pulses: Normal pulses.          Radial pulses are 2+ on the right side and 2+ on the left side.       Dorsalis pedis pulses are 2+ on the right side and 2+ on the left side.     Heart sounds: Normal heart sounds, S1 normal and S2 normal. No murmur heard.   Pulmonary:     Effort: Pulmonary effort is normal. No respiratory distress.     Breath sounds: Wheezing present.  Abdominal:     General: There is no distension.     Palpations: Abdomen is soft. There is no mass.     Tenderness: There is abdominal tenderness.  Musculoskeletal:     Right lower leg: No edema.     Left lower leg: No edema.  Neurological:     Mental Status: He is alert. Mental status is at baseline.  Psychiatric:        Behavior: Behavior is cooperative.     Assessment/Plan:  Active Problems:   AKI (acute kidney injury) (HCC)   Near syncope   Mr.Laverdure is a 53 yo M w/ PMH of HTN, COPD, Asthma, HFrEF (EF 35-40% on prior echo), htn, Cocaine use and tobacco use  admit for near-syncope due to dehydration in setting of diuretic use.   Near-syncope 2/2 dehydration Lactate trended down 2.0 last night. Appear comfortably. Received 1.5L overnight. Symptoms resolving. Will restart BP medications per Cardiology. PT unable to evaluate due to patients agitation and belligerence. - Telemetry   Acute Kidney Injury Hyperkalemia On admission BUN 29, Creatinine 2.22. Improving with fluids. Hyperkalemia resolved K 5.8->3.2. Creatinine downtrended to 1.82. Not at baseline (1.05). Replenish with oral intake. Refused morning blood draws. - Fluid resuscitation as above - Start lokelma - Trend renal fx - Avoid nephrotoxic meds whena ble   Atypical Chest Pain with Hx of cocaine use: Present w/ atypical chest pain. Had multiple ED visits for similar complaint. Constant, not associated with exertion. On chart review, admit at [facility] on 05/2020. Stress test was performed which was deemed undiagnostic due to motion artifact. Cardiac CTA performed on 03/2020 showing multi-vessel disease. Unfortunately left AMA due to 'feeling disrespected' per notes from [place]. Overnight troponin stable at 22->23. Am EKG improved from last night with resolution of new T-wave inversions. Echocardiogram revealed EF of 40-45%  and no structural abnormalities. Did not report chest pain this am. Due to wheezing will not get Lexiscan today, plan for outpatient follow up.   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 At the time left ama prior to further intervention. On lisinopril, torsemide, carvedilol per home med list. Appear hypovolemic on exam. BNP at 46.3. Attempted to counsel on cocaine cessation, but patient became irritable and refused to discuss further. 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 On Symbicort and albuterol rescue as needed. No significant wheezing on exam -Continue home medications   HTN: Has had high pressures since yesterday. Cardiology has recommended medications. -Start Hydralazine 25 mg q8. -Start Imdur 30 mg daily   DVT prophx: Lovenox Diet: Regular Code: Full   Prior to Admission Living Arrangement: Unstable Housing Anticipated Discharge Location: Community Barriers to Discharge: Medical Workup Dispo: Anticipated discharge in approximately 1-3 day(s).   Lauro Franklin, MD 07/23/2020, 6:03 AM Pager: [phone] After 5pm on weekdays and 1pm on weekends: On Call pager [phone]

## 2020-07-23 NOTE — TOC Initial Note (Signed)
Transition of Care [facility]) - Initial/Assessment Note    Patient Details  Name: [name] MRN: [id number] Date of Birth: 04-13-1966  Transition of [facility]) CM/SW Contact:    Leone Haven, RN Phone Number: 07/23/2020, 2:14 PM  Clinical Narrative:                 Patient is for dc home today, NCM assisted with Match Letter for meds, he states he has transport home.  NCM gave him substance abuse resources also.  NCM asked who he lived with, he states that is personal, he is going to [place].  Previous note states he is homeless.   Expected Discharge Plan: Home/Self Care Barriers to Discharge: No Barriers Identified   Patient Goals and CMS Choice Patient states their goals for this hospitalization and ongoing recovery are:: get better   Choice offered to / list presented to : NA  Expected Discharge Plan and Services Expected Discharge Plan: Home/Self Care   Discharge Planning Services: CM Consult Post Acute Care Choice: NA Living arrangements for the past 2 months: Homeless Expected Discharge Date: 07/23/20                 DME Agency: NA       HH Arranged: NA          Prior Living Arrangements/Services Living arrangements for the past 2 months: Homeless   Patient language and need for interpreter reviewed:: Yes Do you feel safe going back to the place where you live?: Yes      Need for Family Participation in Patient Care: No (Comment) Care giver support system in place?: No (comment)   Criminal Activity/Legal Involvement Pertinent to Current Situation/Hospitalization: No - Comment as needed  Activities of Daily Living      Permission Sought/Granted                  Emotional Assessment Appearance:: Appears stated age Attitude/Demeanor/Rapport: Guarded Affect (typically observed): Guarded Orientation: : Oriented to Self, Oriented to Place, Oriented to  Time, Oriented to Situation Alcohol / Substance Use: Illicit Drugs Psych  Involvement: No (comment)  Admission diagnosis:  Dehydration [E86.0] Hyperkalemia [E87.5] AKI (acute kidney injury) (HCC) [N17.9] Near syncope [R55] Long QT interval [R94.31] Non-intractable vomiting with nausea, unspecified vomiting type [R11.2] Patient Active Problem List   Diagnosis Date Noted  . Near syncope 07/21/2020  . Schizoaffective disorder (HCC) 06/24/2019  . Shortness of breath   . CHF exacerbation (HCC) 06/21/2019  . QT prolongation 05/25/2019  . Cocaine abuse (HCC)   . Chest pain 05/24/2019  . Elevated troponin   . Homelessness   . Suicidal ideations   . Acute on chronic systolic heart failure (HCC) 04/18/2019  . Bilateral foot pain 04/05/2019  . Acute on chronic clinical systolic heart failure (HCC) 04/04/2019  . Housing situation unstable 03/28/2019  . AKI (acute kidney injury) (HCC)   . Bilateral lower extremity edema   . Acute on chronic HFrEF (heart failure with reduced ejection fraction) (HCC) 03/27/2019  . COPD exacerbation (HCC)   . Acute exacerbation of CHF (congestive heart failure) (HCC) 03/04/2019  . Asthma 12/26/2016  . Tobacco use disorder 12/26/2016  . Schizophrenia (HCC) 12/26/2016  . Cocaine abuse with cocaine-induced mood disorder (HCC) 09/06/2016  . Alcohol use disorder, mild, abuse 07/31/2016  . Substance induced mood disorder (HCC) 07/30/2016  . Schizoaffective disorder, bipolar type (HCC) 05/31/2016  . Cocaine use disorder, severe, dependence (HCC) 05/31/2016  . Essential hypertension 05/31/2016   PCP:  Medicine, [facility]:   [facility] - [place], [name] - [address] [name] [address] Phone: [phone] Fax: [phone]  [id number] [place], [name] - [address] AT [facility] OF [address] [name] [address] Phone: [phone] Fax: [phone]  [facility] - [place], [name] - [address] [name] [address] Phone: [phone] Fax: [phone]     Social Determinants of Health (SDOH) Interventions    Readmission Risk Interventions Readmission Risk Prevention Plan 07/23/2020 04/06/2019 03/29/2019  Transportation Screening Complete Complete Complete  PCP or Specialist Appt within 3-5 Days Complete Complete Patient refused  HRI or Home Care Consult Complete Complete -  Social Work Consult for Recovery Care Planning/Counseling Complete Complete Complete  Palliative Care Screening Not Applicable Not Applicable Not Applicable  Medication Review Oceanographer) Complete Complete Complete  Some recent data might be hidden

## 2020-07-23 NOTE — Discharge Instructions (Addendum)
Heart Failure Pharmacist at [facility]:  - [name]: [phone]

## 2020-07-26 LAB — CULTURE, BLOOD (ROUTINE X 2)
Culture: NO GROWTH
Culture: NO GROWTH
Special Requests: ADEQUATE

## 2020-07-30 ENCOUNTER — Telehealth: Payer: Self-pay | Admitting: Cardiology

## 2020-07-30 NOTE — Telephone Encounter (Signed)
lvm for patient to return call to get follow up scheduled with [facility] from recall list 

## 2020-08-21 ENCOUNTER — Encounter (HOSPITAL_COMMUNITY): Payer: Self-pay

## 2020-08-21 ENCOUNTER — Emergency Department (HOSPITAL_COMMUNITY)
Admission: EM | Admit: 2020-08-21 | Discharge: 2020-08-22 | Disposition: A | Discharge status: Home / Self Care | Payer: Medicaid Other | Attending: Emergency Medicine | Admitting: Emergency Medicine

## 2020-08-21 ENCOUNTER — Emergency Department (HOSPITAL_COMMUNITY): Payer: Medicaid Other

## 2020-08-21 DIAGNOSIS — I5042 Chronic combined systolic (congestive) and diastolic (congestive) heart failure: Secondary | ICD-10-CM | POA: Insufficient documentation

## 2020-08-21 DIAGNOSIS — J441 Chronic obstructive pulmonary disease with (acute) exacerbation: Secondary | ICD-10-CM | POA: Diagnosis not present

## 2020-08-21 DIAGNOSIS — Z590 Homelessness: Secondary | ICD-10-CM | POA: Diagnosis not present

## 2020-08-21 DIAGNOSIS — I110 Hypertensive heart disease with heart failure: Secondary | ICD-10-CM | POA: Insufficient documentation

## 2020-08-21 DIAGNOSIS — F1424 Cocaine dependence with cocaine-induced mood disorder: Secondary | ICD-10-CM | POA: Insufficient documentation

## 2020-08-21 DIAGNOSIS — F17210 Nicotine dependence, cigarettes, uncomplicated: Secondary | ICD-10-CM | POA: Insufficient documentation

## 2020-08-21 DIAGNOSIS — L84 Corns and callosities: Secondary | ICD-10-CM | POA: Insufficient documentation

## 2020-08-21 DIAGNOSIS — Z20822 Contact with and (suspected) exposure to covid-19: Secondary | ICD-10-CM | POA: Insufficient documentation

## 2020-08-21 DIAGNOSIS — Z7951 Long term (current) use of inhaled steroids: Secondary | ICD-10-CM | POA: Diagnosis not present

## 2020-08-21 DIAGNOSIS — R0602 Shortness of breath: Secondary | ICD-10-CM | POA: Diagnosis present

## 2020-08-21 DIAGNOSIS — F259 Schizoaffective disorder, unspecified: Secondary | ICD-10-CM | POA: Diagnosis not present

## 2020-08-21 DIAGNOSIS — Z79899 Other long term (current) drug therapy: Secondary | ICD-10-CM | POA: Insufficient documentation

## 2020-08-21 DIAGNOSIS — R45851 Suicidal ideations: Secondary | ICD-10-CM | POA: Diagnosis not present

## 2020-08-21 LAB — CBC WITH DIFFERENTIAL/PLATELET
Abs Immature Granulocytes: 0.02 10*3/uL (ref 0.00–0.07)
Basophils Absolute: 0 10*3/uL (ref 0.0–0.1)
Basophils Relative: 1 %
Eosinophils Absolute: 0.1 10*3/uL (ref 0.0–0.5)
Eosinophils Relative: 1 %
HCT: 43.6 % (ref 39.0–52.0)
Hemoglobin: 14.3 g/dL (ref 13.0–17.0)
Immature Granulocytes: 0 %
Lymphocytes Relative: 37 %
Lymphs Abs: 2.4 10*3/uL (ref 0.7–4.0)
MCH: 32.6 pg (ref 26.0–34.0)
MCHC: 32.8 g/dL (ref 30.0–36.0)
MCV: 99.3 fL (ref 80.0–100.0)
Monocytes Absolute: 0.6 10*3/uL (ref 0.1–1.0)
Monocytes Relative: 9 %
Neutro Abs: 3.5 10*3/uL (ref 1.7–7.7)
Neutrophils Relative %: 52 %
Platelets: 211 10*3/uL (ref 150–400)
RBC: 4.39 MIL/uL (ref 4.22–5.81)
RDW: 14.8 % (ref 11.5–15.5)
WBC: 6.6 10*3/uL (ref 4.0–10.5)
nRBC: 0 % (ref 0.0–0.2)

## 2020-08-21 LAB — BASIC METABOLIC PANEL
Anion gap: 11 (ref 5–15)
BUN: 16 mg/dL (ref 6–20)
CO2: 22 mmol/L (ref 22–32)
Calcium: 9.3 mg/dL (ref 8.9–10.3)
Chloride: 106 mmol/L (ref 98–111)
Creatinine, Ser: 1.36 mg/dL — ABNORMAL HIGH (ref 0.61–1.24)
GFR calc Af Amer: >60 mL/min (ref >60)
GFR calc non Af Amer: 59 mL/min — ABNORMAL LOW (ref >60)
Glucose, Bld: 98 mg/dL (ref 70–99)
Potassium: 4.2 mmol/L (ref 3.5–5.1)
Sodium: 139 mmol/L (ref 135–145)

## 2020-08-21 LAB — BRAIN NATRIURETIC PEPTIDE: B Natriuretic Peptide: 325.2 pg/mL — ABNORMAL HIGH (ref 0.0–100.0)

## 2020-08-21 LAB — TROPONIN I (HIGH SENSITIVITY): Troponin I (High Sensitivity): 33 ng/L — ABNORMAL HIGH (ref <18)

## 2020-08-21 LAB — SARS CORONAVIRUS 2 BY RT PCR (HOSPITAL ORDER, PERFORMED IN ~~LOC~~ HOSPITAL LAB): SARS Coronavirus 2: NEGATIVE

## 2020-08-21 IMAGING — DX DG CHEST 1V PORT
1 series · 1 of 1 positions shown · non-contrast
Comparison: [DATE]

CLINICAL DATA: Shortness of breath and cough for several weeks

EXAM:
PORTABLE CHEST 1 VIEW

[chest]
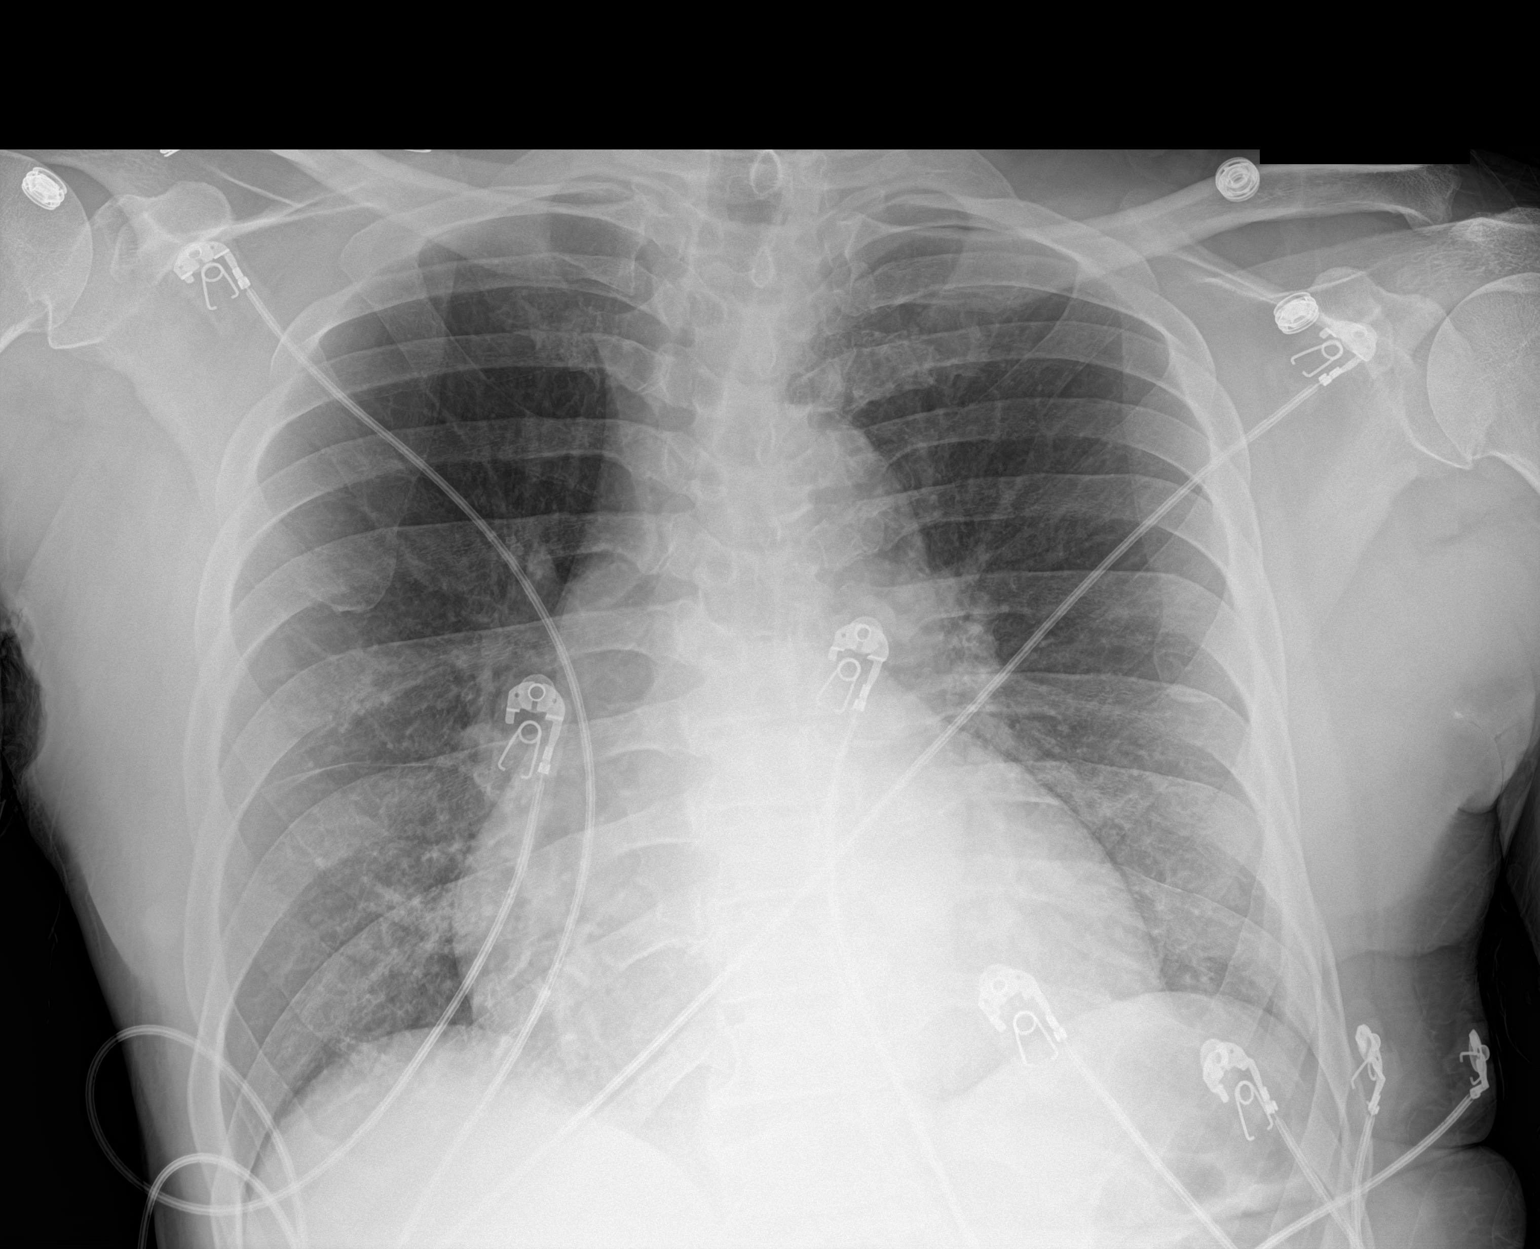

[1 of 1 positions shown; findings below may reference images not displayed]

FINDINGS: Cardiac shadow remains mildly enlarged accentuated by the portable
technique. The lungs are well aerated bilaterally. Minimal
interstitial opacity is noted in the bases bilaterally slightly
increased when compared with the prior exam. No focal confluent
infiltrate is seen. No bony abnormality is noted.
IMPRESSION: Mild interstitial opacities in the bases bilaterally.

## 2020-08-21 MED ORDER — HYDRALAZINE HCL 25 MG PO TABS
25.0000 mg | ORAL_TABLET | Freq: Once | ORAL | Status: AC
  Administered 2020-08-21: 25 mg via ORAL
  Filled 2020-08-21: qty 1

## 2020-08-21 MED ORDER — ALBUTEROL SULFATE HFA 108 (90 BASE) MCG/ACT IN AERS
4.0000 | INHALATION_SPRAY | RESPIRATORY_TRACT | Status: DC | PRN
  Administered 2020-08-21: 4 via RESPIRATORY_TRACT
  Filled 2020-08-21: qty 6.7

## 2020-08-21 MED ORDER — TORSEMIDE 20 MG PO TABS
20.0000 mg | ORAL_TABLET | Freq: Once | ORAL | Status: AC
  Administered 2020-08-21: 20 mg via ORAL
  Filled 2020-08-21: qty 1

## 2020-08-21 NOTE — Discharge Instructions (Signed)
Please read and follow all provided instructions.  Your diagnoses today include:  1. SOB (shortness of breath)   2. Callus of foot   3. Chronic combined systolic and diastolic congestive heart failure (HCC)   4. COPD exacerbation (HCC)     Tests performed today include: Blood cell counts - were normal Electrolytes and kidney function - potassium and kidney function were okay Chest x-ray - no signs of heart failure Blood test for heart failure - slightly high but not severely high, I do not think you are having a serious heart failure exacerbation today EKG  Vital signs. See below for your results today.   Medications prescribed:   Albuterol inhaler - medication that opens up your airway  Use inhaler as follows: 1-2 puffs with spacer every 4 hours as needed for wheezing, cough, or shortness of breath.   Take any prescribed medications only as directed.  Home care instructions:  Follow any educational materials contained in this packet.  BE VERY CAREFUL not to take multiple medicines containing Tylenol (also called acetaminophen). Doing so can lead to an overdose which can damage your liver and cause liver failure and possibly death.   Follow-up instructions: You have a virtual visit with [facility] tomorrow at 2:30pm. Please keep this appointment! They will help you with your chronic medications!  Return instructions:   Please return to the Emergency Department if you experience worsening symptoms.   Return if you have worsening cough, fever, shortness of breath.   Please return if you have any other emergent concerns.  Additional Information:  Your vital signs today were: BP (!) 174/103   Pulse 72   Temp 98.3 F (36.8 C) (Oral)   Resp (!) 23   Wt 84.6 kg   SpO2 100%   BMI 27.54 kg/m  If your blood pressure (BP) was elevated above 135/85 this visit, please have this repeated by your doctor within one month. --------------
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
## 2020-08-21 NOTE — ED Triage Notes (Signed)
Per ems pt  Has been experiencing SOB/Cough/Cp for the last 3-4 weeks. Pt has hx of COPD/CHF/Hypertension. Pt states he also has been coughing up thick flim. 96-98 on RA.

## 2020-08-22 ENCOUNTER — Other Ambulatory Visit: Payer: Self-pay

## 2020-08-22 ENCOUNTER — Emergency Department (HOSPITAL_COMMUNITY)
Admission: EM | Admit: 2020-08-22 | Discharge: 2020-08-23 | Disposition: A | Discharge status: Other Acute Inpatient Hospital | Payer: Medicaid Other | Source: Home / Self Care | Attending: Emergency Medicine | Admitting: Emergency Medicine

## 2020-08-22 ENCOUNTER — Encounter (HOSPITAL_COMMUNITY): Payer: Self-pay | Admitting: Emergency Medicine

## 2020-08-22 ENCOUNTER — Ambulatory Visit: Payer: Medicaid Other | Attending: Nurse Practitioner | Admitting: Nurse Practitioner

## 2020-08-22 DIAGNOSIS — R45851 Suicidal ideations: Secondary | ICD-10-CM

## 2020-08-22 DIAGNOSIS — I5023 Acute on chronic systolic (congestive) heart failure: Secondary | ICD-10-CM | POA: Insufficient documentation

## 2020-08-22 DIAGNOSIS — Z79899 Other long term (current) drug therapy: Secondary | ICD-10-CM | POA: Insufficient documentation

## 2020-08-22 DIAGNOSIS — R0602 Shortness of breath: Secondary | ICD-10-CM

## 2020-08-22 DIAGNOSIS — F259 Schizoaffective disorder, unspecified: Secondary | ICD-10-CM | POA: Diagnosis present

## 2020-08-22 DIAGNOSIS — Z590 Homelessness: Secondary | ICD-10-CM | POA: Insufficient documentation

## 2020-08-22 DIAGNOSIS — F1994 Other psychoactive substance use, unspecified with psychoactive substance-induced mood disorder: Secondary | ICD-10-CM | POA: Diagnosis present

## 2020-08-22 DIAGNOSIS — J441 Chronic obstructive pulmonary disease with (acute) exacerbation: Secondary | ICD-10-CM | POA: Insufficient documentation

## 2020-08-22 DIAGNOSIS — F1424 Cocaine dependence with cocaine-induced mood disorder: Secondary | ICD-10-CM | POA: Insufficient documentation

## 2020-08-22 DIAGNOSIS — F17210 Nicotine dependence, cigarettes, uncomplicated: Secondary | ICD-10-CM | POA: Insufficient documentation

## 2020-08-22 DIAGNOSIS — I110 Hypertensive heart disease with heart failure: Secondary | ICD-10-CM | POA: Insufficient documentation

## 2020-08-22 DIAGNOSIS — F1420 Cocaine dependence, uncomplicated: Secondary | ICD-10-CM | POA: Diagnosis present

## 2020-08-22 LAB — URINALYSIS, ROUTINE W REFLEX MICROSCOPIC
Bilirubin Urine: NEGATIVE
Glucose, UA: NEGATIVE mg/dL
Hgb urine dipstick: NEGATIVE
Ketones, ur: NEGATIVE mg/dL
Leukocytes,Ua: NEGATIVE
Nitrite: NEGATIVE
Protein, ur: NEGATIVE mg/dL
Specific Gravity, Urine: 1.01 (ref 1.005–1.030)
pH: 5 (ref 5.0–8.0)

## 2020-08-22 LAB — RAPID URINE DRUG SCREEN, HOSP PERFORMED
Amphetamines: NOT DETECTED
Barbiturates: NOT DETECTED
Benzodiazepines: NOT DETECTED
Cocaine: POSITIVE — AB
Opiates: NOT DETECTED
Tetrahydrocannabinol: NOT DETECTED

## 2020-08-22 LAB — VALPROIC ACID LEVEL: Valproic Acid Lvl: <10 ug/mL — ABNORMAL LOW (ref 50.0–100.0)

## 2020-08-22 MED ORDER — LEVALBUTEROL HCL 0.63 MG/3ML IN NEBU
0.6300 mg | INHALATION_SOLUTION | Freq: Once | RESPIRATORY_TRACT | Status: AC
  Administered 2020-08-22: 0.63 mg via RESPIRATORY_TRACT
  Filled 2020-08-22: qty 3

## 2020-08-22 MED ORDER — GABAPENTIN 300 MG PO CAPS
300.0000 mg | ORAL_CAPSULE | Freq: Three times a day (TID) | ORAL | Status: DC
  Administered 2020-08-22 – 2020-08-23 (×4): 300 mg via ORAL
  Filled 2020-08-22 (×4): qty 1

## 2020-08-22 MED ORDER — DIVALPROEX SODIUM ER 500 MG PO TB24
500.0000 mg | ORAL_TABLET | Freq: Two times a day (BID) | ORAL | Status: DC
  Administered 2020-08-22 – 2020-08-23 (×3): 500 mg via ORAL
  Filled 2020-08-22 (×3): qty 1

## 2020-08-22 MED ORDER — GABAPENTIN 600 MG PO TABS
300.0000 mg | ORAL_TABLET | Freq: Three times a day (TID) | ORAL | Status: DC
  Filled 2020-08-22 (×2): qty 0.5

## 2020-08-22 NOTE — ED Notes (Signed)
Pt ambulatory to bathroom with steady gait.

## 2020-08-22 NOTE — ED Notes (Signed)
Dr Freida Busman at bedside to address pt issues of SOB and foot ulcers. Chest xray reviewed and SOB not identified at this time pt speaking in full sentences and no signs of respiratory issues noted. No immediate needs noted for foot ulcers consult to podiatrist offered and refused.

## 2020-08-22 NOTE — ED Provider Notes (Signed)
Patient seen after complain about chronic foot issue as well as shortness of breath.  Patient's pulse oximetry respirations are stable.  He has chronic excess skin on the patient's feet.  No acute infection noted.   Lorre Nick, MD 08/22/20 2257

## 2020-08-22 NOTE — ED Notes (Signed)
PT have no wallet. One debit visa [id number] inside jeans

## 2020-08-22 NOTE — ED Provider Notes (Signed)
Patient medically clear for psychiatric treatment.   [name], [name], MD 08/22/20 [phone]

## 2020-08-22 NOTE — ED Triage Notes (Signed)
Patient is stating he is going to kill himself when EMS said they were taking him to [facility]. Patient was just discharged for sob at [facility]. Patient has had labs and covid test.

## 2020-08-22 NOTE — ED Notes (Signed)
Patient ambulatory to and from the bathroom with a steady gait.  

## 2020-08-22 NOTE — ED Notes (Signed)
Patient very argumentive and not allowing this RN to get vitals. This RN explaiend to the patient that in order to give blood pressure medications I need an accurate bp. Pt very upset.

## 2020-08-22 NOTE — BH Assessment (Signed)
[facility] Assessment Progress Note  Per Shuvon Rankin, NP, this voluntary pt requires psychiatric hospitalization at this time.  The following facilities have been contacted to seek placement for this pt, with results as noted:  Beds available, information sent, decision pending: [facility]  Declined: [facility]  Not referred: [facility] (no [place] IPRS contract) [facility] (no IT sales professional)  At capacity: [facility] (no [place] I[facility]S beds) [facility]  If pt is voluntary and he is accepted to a facility, please discuss disposition with pt to be sure that he agrees to the plan.  If a facility agrees to accept pt and the plan changes in any way please call the facility to inform them of the change.  Final disposition is pending as of this writing.   Doylene Canning, [name] [facility] Coordinator [phone]
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
## 2020-08-22 NOTE — ED Provider Notes (Signed)
[facility]-EMERGENCY DEPT Provider Note   CSN: [id number] Arrival date & time: 08/22/20  0138     History Chief Complaint  Patient presents with  . Suicidal  . Shortness of Breath    [name] is a 54 y.o. male.  Patient to ED with SOB and SI. Just seen with full evaluation at [facility] for SOB, history of COPD, tobacco abuse, cocaine abuse, CHF, and found safe for discharge home. He reports "they did nothing for me". He states here that he has been suicidal for the past 3 days. States has attempted in the past. No HI or AVH. Last alcohol was yesterday, last cocaine was 3 days ago.   The history is provided by the patient and the EMS personnel. No language interpreter was used.  Shortness of Breath Associated symptoms: no fever        Past Medical History:  Diagnosis Date  . Asthma   . Bipolar 1 disorder (HCC)   . CHF (congestive heart failure) (HCC)   . Depression   . Homelessness   . Hypertension   . QT prolongation   . Schizoaffective disorder [facility])     Patient Active Problem List   Diagnosis Date Noted  . Near syncope 07/21/2020  . Schizoaffective disorder (HCC) 06/24/2019  . Shortness of breath   . CHF exacerbation (HCC) 06/21/2019  . QT prolongation 05/25/2019  . Cocaine abuse (HCC)   . Chest pain 05/24/2019  . Elevated troponin   . Homelessness   . Suicidal ideations   . Acute on chronic systolic heart failure (HCC) 04/18/2019  . Bilateral foot pain 04/05/2019  . Acute on chronic clinical systolic heart failure (HCC) 04/04/2019  . Housing situation unstable 03/28/2019  . AKI (acute kidney injury) (HCC)   . Bilateral lower extremity edema   . Acute on chronic HFrEF (heart failure with reduced ejection fraction) (HCC) 03/27/2019  . COPD exacerbation (HCC)   . Acute exacerbation of CHF (congestive heart failure) (HCC) 03/04/2019  . Asthma 12/26/2016  . Tobacco use disorder 12/26/2016  . Schizophrenia (HCC) 12/26/2016   . Cocaine abuse with cocaine-induced mood disorder (HCC) 09/06/2016  . Alcohol use disorder, mild, abuse 07/31/2016  . Substance induced mood disorder (HCC) 07/30/2016  . Schizoaffective disorder, bipolar type (HCC) 05/31/2016  . Cocaine use disorder, severe, dependence (HCC) 05/31/2016  . Essential hypertension 05/31/2016    History reviewed. No pertinent surgical history.     Family History  Problem Relation Age of Onset  . Drug abuse Mother   . Suicidality Mother     Social History   Tobacco Use  . Smoking status: Current Every Day Smoker    Years: 30.00    Types: Cigarettes  . Smokeless tobacco: Never Used  Vaping Use  . Vaping Use: Never used  Substance Use Topics  . Alcohol use: Yes    Comment: infrequently  . Drug use: Yes    Types: Cocaine, Marijuana    Home Medications Prior to Admission medications   Medication Sig Start Date End Date Taking? Authorizing Provider  albuterol (PROVENTIL HFA;VENTOLIN HFA) 108 (90 Base) MCG/ACT inhaler Inhale 1-2 puffs into the lungs every 4 (four) hours as needed for shortness of breath. Patient not taking: Reported on 05/24/2019 08/22/18   Dione Booze, MD  ARIPiprazole (ABILIFY) 2 MG tablet Take 1 tablet (2 mg total) by mouth at bedtime. Patient not taking: Reported on 06/21/2019 05/26/19   Mirian Mo, MD  atorvastatin (LIPITOR) 20 MG tablet  Take 1 tablet (20 mg total) by mouth daily. 07/24/20   Lauro Franklin, MD  divalproex (DEPAKOTE ER) 500 MG 24 hr tablet Take 1 tablet (500 mg total) by mouth in the morning and at bedtime. 07/16/20   Linwood Dibbles, MD  gabapentin (NEURONTIN) 600 MG tablet Take 1 tablet (600 mg total) by mouth 3 (three) times daily. Patient not taking: Reported on 07/21/2020 07/16/20   Linwood Dibbles, MD  hydrALAZINE (APRESOLINE) 25 MG tablet Take 1 tablet (25 mg total) by mouth every 8 (eight) hours. 07/23/20   Lauro Franklin, MD  isosorbide mononitrate (IMDUR) 30 MG 24 hr tablet Take 1 tablet (30 mg  total) by mouth daily. 07/24/20   Lauro Franklin, MD  torsemide (DEMADEX) 20 MG tablet Take 1 tablet (20 mg total) by mouth 2 (two) times daily. 07/16/20   Linwood Dibbles, MD  umeclidinium bromide (INCRUSE ELLIPTA) 62.5 MCG/INH AEPB Inhale 1 puff into the lungs daily. 07/23/20   Lauro Franklin, MD    Allergies    Ibuprofen and Acetaminophen  Review of Systems   Review of Systems  Constitutional: Negative for chills and fever.  HENT: Negative.   Eyes: Negative for visual disturbance.  Respiratory: Positive for shortness of breath.   Cardiovascular: Negative.   Gastrointestinal: Negative.   Genitourinary: Negative.   Musculoskeletal: Negative.   Skin: Negative.   Neurological: Negative.   Psychiatric/Behavioral: Positive for suicidal ideas.    Physical Exam Updated Vital Signs BP (!) 127/100 (BP Location: Left Arm)   Pulse 83   Temp 98.5 F (36.9 C) (Oral)   Resp 17   Ht 5\' 9"  (1.753 m)   Wt 84.4 kg   SpO2 97%   BMI 27.47 kg/m   Physical Exam Vitals and nursing note reviewed.  Constitutional:      Appearance: He is well-developed.  HENT:     Head: Normocephalic.  Cardiovascular:     Rate and Rhythm: Normal rate and regular rhythm.  Pulmonary:     Effort: Pulmonary effort is normal. No tachypnea or respiratory distress.     Breath sounds: Wheezing, rhonchi and rales present.  Chest:     Chest wall: No tenderness.  Abdominal:     General: Bowel sounds are normal.     Palpations: Abdomen is soft.     Tenderness: There is no abdominal tenderness. There is no guarding or rebound.  Musculoskeletal:        General: Normal range of motion.     Cervical back: Normal range of motion and neck supple.     Right lower leg: No edema.     Left lower leg: No edema.  Skin:    General: Skin is warm and dry.  Neurological:     Mental Status: He is alert and oriented to person, place, and time.  Psychiatric:        Attention and Perception: Perception normal.         Mood and Affect: Affect is blunt.        Behavior: Behavior is cooperative.        Thought Content: Thought content includes suicidal ideation. Thought content does not include homicidal ideation.     ED Results / Procedures / Treatments   Labs (all labs ordered are listed, but only abnormal results are displayed) Labs Reviewed - No data to display  EKG None  Radiology DG Chest [facility] 1 View  Result Date: 08/21/2020 CLINICAL DATA:  Shortness of breath and cough for  several weeks EXAM: PORTABLE CHEST 1 VIEW COMPARISON:  07/21/2020 FINDINGS: Cardiac shadow remains mildly enlarged accentuated by the portable technique. The lungs are well aerated bilaterally. Minimal interstitial opacity is noted in the bases bilaterally slightly increased when compared with the prior exam. No focal confluent infiltrate is seen. No bony abnormality is noted. IMPRESSION: Mild interstitial opacities in the bases bilaterally. Electronically Signed   By: Alcide Clever M.D.   On: 08/21/2020 21:59    Procedures Procedures (including critical care time)  Medications Ordered in ED Medications  levalbuterol (XOPENEX) nebulizer solution 0.63 mg (0.63 mg Nebulization Given 08/22/20 0326)    ED Course  I have reviewed the triage vital signs and the nursing notes.  Pertinent labs & imaging results that were available during my care of the patient were reviewed by me and considered in my medical decision making (see chart for details).    MDM Rules/Calculators/A&P                          Patient to ED with persistent SOB - just seen with full evaluation of same at [facility] and was safely discharged. Denies fever. Continuous smoker.   He comes to [facility] ED by EMS immediately after discharge from [facility] stating he continues to be SOB, and he is having suicidal ideations. History of SI, with previous attempt. He  Reports SI for the past 3 days, however, no c/o SI during [facility] evaluation hours ago.   On chart review,  the patient goes from hospital to hospital with records from [place], [facility] [place], [facility], [place], [place] all this year. Known to be homeless.   Feel there is strong secondary gain by visit to ED this evening for 2nd time in less than 24 hours, however, has a psychiatric history including SI and attempt in the past.      Final Clinical Impression(s) / ED Diagnoses Final diagnoses:  None   1. Shortness of breath 2. Suicidal ideation 3. Homeless  Rx / DC Orders ED Discharge Orders    None       Elpidio Anis, Cordelia Poche 08/22/20 6384    Nira Conn, MD 08/22/20 [phone]
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
## 2020-08-22 NOTE — ED Notes (Addendum)
PT upset state his food have been out at desk getting cold.PT request to speak only to RN

## 2020-08-22 NOTE — ED Notes (Signed)
Assumed care of pt at this time. Pt resting in stretcher, no s/sx of acute distress at this time.  

## 2020-08-22 NOTE — ED Notes (Signed)
Patient is resting comfortably. 

## 2020-08-22 NOTE — ED Notes (Signed)
ED Provider at bedside. 

## 2020-08-22 NOTE — ED Notes (Signed)
Patient refuses to speak with this nurse about his chief complaint.

## 2020-08-22 NOTE — ED Notes (Signed)
Night shift AC notified that [name] was requesting to speak to her.

## 2020-08-22 NOTE — Progress Notes (Signed)
08/22/2020  1202  Labs drawn and sent to main lab. Dark green and red tubes drawn.

## 2020-08-23 NOTE — Consult Note (Signed)
Patient assessed by nurse practitioner. Patient alert and oriented, answers appropriately. Patient pleasant cooperative during assessment. Patient participates actively in assessment.  Patient states "I just want to get back on my medications, that is all." Patient reports he believes his primary care providers are "not doing anything about my CHF or my health problems."  Patient reports he is not currently seen by outpatient psychiatrist but has been diagnosed with schizoaffective disorder and typically sees psychiatrist "when I go to a mental health facility." Patient reports he has been prescribed Seroquel and this medication was effective for him in the past. Patient reports his Seroquel was discontinued when he was treated for congestive heart failure and Risperdal is currently prescribed. Patient reports medications most recently prescribed include Risperdal, Neurontin and Depakote. Patient reports he believes that Depakote makes him have symptoms of dizziness and nausea and would prefer not to take Depakote again.  Patient endorses alcohol and cocaine use chronically. Patient does not answer when asked if he is ready to stop using alcohol and substance.  Patient endorses suicidal ideations, reports plan to overdose on medication but confirms that he is not receiving medications at this time. Patient denies intent to harm self. Patient endorses multiple prior suicide attempts. Patient reports he has not counted the number of times that he has attempted to kill himself and "does not keep up with it." Patient denies homicidal ideations. Patient denies auditory and visual hallucinations. Patient does not appear to be responding to internal stimuli and there is no evidence of delusional thought content. Patient denies symptoms of paranoia.  Patient resides in [place] with a friend. Patient denies access to weapons. Patient is employed in [employer].  Patient reviewed with Dr. Nelly Rout.  Inpatient psychiatric treatment recommended.  Patient accepted to [facility] for inpatient psychiatric treatment.

## 2020-08-23 NOTE — Progress Notes (Signed)
Contacted [facility] to follow up in [place] driver that is waiting to pick up patient. States [name] was cancelled and [employer] was ordered. [facility] Supervisor will follow up with [name] driver, Christiane Ha #[phone]. Pt is waiting on [facility] for dc to [facility]. Isidoro Donning RN CCM, [place] ED TOC CM [phone]

## 2020-08-23 NOTE — ED Notes (Signed)
[name] is display verbal manipulative behavior, he stated to [facility] Coverage Lorie RN  that no one is addressing his needs such as his breathing and now that he has not received double portions on his meal tray. Since this was the first time that the meal tray issue was brought up he was allowed a sandwich outside of our normal meal time hours, as a rule 8 pm is that last snack time. All evening [name] made no mention of his meal portions and did not request food from staff. After house coverage talked with him a sandwich was given. [name] has displayed verbally abusive behavior to his sitters frequently stating "what are you looking at". He was informed that it is the sitters job to watch him and that he is not to harass the sitters. He was verbally angered but no further acting out at this time.

## 2020-08-23 NOTE — ED Notes (Signed)
Pt discharged safely with Safe Transport.

## 2020-08-23 NOTE — ED Provider Notes (Signed)
Emergency Medicine Observation Re-evaluation Note  Gohan Collister is a 54 y.o. male, seen on rounds today.  Pt initially presented to the ED for complaints of Suicidal Currently, the patient is without acute events overnight.  Physical Exam  BP (!) 163/114 (BP Location: Right Arm)   Pulse 66   Temp 98.2 F (36.8 C) (Oral)   Resp 20   Ht 5\' 9"  (1.753 m)   Wt 84.4 kg   SpO2 92%   BMI 27.47 kg/m  Physical Exam General: NAD Cardiac: RRR Lungs: CTA B Psych: Calm  ED Course / MDM  EKG:    I have reviewed the labs performed to date as well as medications administered while in observation.  Recent changes in the last 24 hours include none.  Plan  Current plan is for pending placement. Patient is not under full IVC at this time.   , MD 08/23/20 [phone]

## 2020-08-23 NOTE — ED Provider Notes (Signed)
Medical Decision Making: Care of patient assumed from Dr. Serina Cowper at 1500.  Agree with history, physical exam and plan.  See their note for further details.  Briefly, The pt p/w mental health disorder, needing placement..   Current plan is as follows: Social [facility] place. [facility]    I personally reviewed and interpreted all labs/imaging.      [name] Donovan, MD 08/23/20 [phone]

## 2020-08-23 NOTE — BH Assessment (Signed)
[facility] Assessment Progress Note  Per Berneice Heinrich, NP, this voluntary pt requires psychiatric hospitalization at this time.  At 14:35  Britta Mccreedy calls from [facility]  Pt has been accepted to their facility by Nani Ravens, NP.  Inetta Fermo concurs with this disposition, as does the pt.  Pt's nurse, Kendal Hymen, has been notified, and agrees to call report to [phone].  Pt is to be transported via [employer].  [facility] stipulates that pt must arrive either before 23:00 tonight, or after 06:00 tomorrow.    Doylene Canning, [name] [facility] Coordinator [phone]

## 2020-08-23 NOTE — ED Notes (Signed)
Patient states that the Depakote is making him dizzy and sick at his stomach.  He states that he really needs help and that he is ready to act on his suicidal thought.  He has no plan at this time.

## 2020-09-03 ENCOUNTER — Telehealth: Payer: Self-pay | Admitting: Cardiology

## 2020-09-03 NOTE — Telephone Encounter (Signed)
lvm for patient to return call to get follow up scheduled with [facility] from recall list 

## 2020-09-07 ENCOUNTER — Inpatient Hospital Stay (HOSPITAL_COMMUNITY)
Admission: EM | Admit: 2020-09-07 | Discharge: 2020-09-11 | DRG: 291 | Disposition: A | Discharge status: Home / Self Care | Payer: Medicaid - Out of State | Attending: Internal Medicine | Admitting: Internal Medicine

## 2020-09-07 ENCOUNTER — Other Ambulatory Visit: Payer: Self-pay

## 2020-09-07 ENCOUNTER — Encounter (HOSPITAL_COMMUNITY): Payer: Self-pay

## 2020-09-07 ENCOUNTER — Emergency Department (HOSPITAL_COMMUNITY): Payer: Medicaid - Out of State

## 2020-09-07 DIAGNOSIS — Z9119 Patient's noncompliance with other medical treatment and regimen: Secondary | ICD-10-CM

## 2020-09-07 DIAGNOSIS — I509 Heart failure, unspecified: Secondary | ICD-10-CM

## 2020-09-07 DIAGNOSIS — Z66 Do not resuscitate: Secondary | ICD-10-CM | POA: Diagnosis present

## 2020-09-07 DIAGNOSIS — R0600 Dyspnea, unspecified: Secondary | ICD-10-CM

## 2020-09-07 DIAGNOSIS — I248 Other forms of acute ischemic heart disease: Secondary | ICD-10-CM | POA: Diagnosis present

## 2020-09-07 DIAGNOSIS — E1140 Type 2 diabetes mellitus with diabetic neuropathy, unspecified: Secondary | ICD-10-CM | POA: Diagnosis present

## 2020-09-07 DIAGNOSIS — Z5900 Homelessness unspecified: Secondary | ICD-10-CM

## 2020-09-07 DIAGNOSIS — I2510 Atherosclerotic heart disease of native coronary artery without angina pectoris: Secondary | ICD-10-CM | POA: Diagnosis present

## 2020-09-07 DIAGNOSIS — I5040 Unspecified combined systolic (congestive) and diastolic (congestive) heart failure: Secondary | ICD-10-CM | POA: Diagnosis present

## 2020-09-07 DIAGNOSIS — Z79899 Other long term (current) drug therapy: Secondary | ICD-10-CM

## 2020-09-07 DIAGNOSIS — Z813 Family history of other psychoactive substance abuse and dependence: Secondary | ICD-10-CM

## 2020-09-07 DIAGNOSIS — E785 Hyperlipidemia, unspecified: Secondary | ICD-10-CM | POA: Diagnosis present

## 2020-09-07 DIAGNOSIS — F1911 Other psychoactive substance abuse, in remission: Secondary | ICD-10-CM | POA: Diagnosis present

## 2020-09-07 DIAGNOSIS — J45909 Unspecified asthma, uncomplicated: Secondary | ICD-10-CM | POA: Diagnosis present

## 2020-09-07 DIAGNOSIS — J449 Chronic obstructive pulmonary disease, unspecified: Secondary | ICD-10-CM | POA: Diagnosis present

## 2020-09-07 DIAGNOSIS — I110 Hypertensive heart disease with heart failure: Principal | ICD-10-CM | POA: Diagnosis present

## 2020-09-07 DIAGNOSIS — I5043 Acute on chronic combined systolic (congestive) and diastolic (congestive) heart failure: Secondary | ICD-10-CM | POA: Diagnosis present

## 2020-09-07 DIAGNOSIS — I5020 Unspecified systolic (congestive) heart failure: Secondary | ICD-10-CM | POA: Diagnosis present

## 2020-09-07 DIAGNOSIS — E11649 Type 2 diabetes mellitus with hypoglycemia without coma: Secondary | ICD-10-CM | POA: Diagnosis present

## 2020-09-07 DIAGNOSIS — Z20822 Contact with and (suspected) exposure to covid-19: Secondary | ICD-10-CM | POA: Diagnosis present

## 2020-09-07 DIAGNOSIS — F17210 Nicotine dependence, cigarettes, uncomplicated: Secondary | ICD-10-CM | POA: Diagnosis present

## 2020-09-07 DIAGNOSIS — F259 Schizoaffective disorder, unspecified: Secondary | ICD-10-CM | POA: Diagnosis present

## 2020-09-07 DIAGNOSIS — F319 Bipolar disorder, unspecified: Secondary | ICD-10-CM | POA: Diagnosis present

## 2020-09-07 DIAGNOSIS — D7589 Other specified diseases of blood and blood-forming organs: Secondary | ICD-10-CM | POA: Diagnosis present

## 2020-09-07 LAB — COMPREHENSIVE METABOLIC PANEL
ALT: 23 U/L (ref 0–44)
AST: 30 U/L (ref 15–41)
Albumin: 3.7 g/dL (ref 3.5–5.0)
Alkaline Phosphatase: 68 U/L (ref 38–126)
Anion gap: 17 — ABNORMAL HIGH (ref 5–15)
BUN: 12 mg/dL (ref 6–20)
CO2: 21 mmol/L — ABNORMAL LOW (ref 22–32)
Calcium: 8.9 mg/dL (ref 8.9–10.3)
Chloride: 102 mmol/L (ref 98–111)
Creatinine, Ser: 1.15 mg/dL (ref 0.61–1.24)
GFR calc non Af Amer: >60 mL/min (ref >60)
Glucose, Bld: 69 mg/dL — ABNORMAL LOW (ref 70–99)
Potassium: 3.8 mmol/L (ref 3.5–5.1)
Sodium: 140 mmol/L (ref 135–145)
Total Bilirubin: 1.6 mg/dL — ABNORMAL HIGH (ref 0.3–1.2)
Total Protein: 6.9 g/dL (ref 6.5–8.1)

## 2020-09-07 LAB — CBC WITH DIFFERENTIAL/PLATELET
Abs Immature Granulocytes: 0.02 10*3/uL (ref 0.00–0.07)
Basophils Absolute: 0 10*3/uL (ref 0.0–0.1)
Basophils Relative: 1 %
Eosinophils Absolute: 0 10*3/uL (ref 0.0–0.5)
Eosinophils Relative: 0 %
HCT: 43.8 % (ref 39.0–52.0)
Hemoglobin: 14.4 g/dL (ref 13.0–17.0)
Immature Granulocytes: 0 %
Lymphocytes Relative: 16 %
Lymphs Abs: 0.9 10*3/uL (ref 0.7–4.0)
MCH: 33.3 pg (ref 26.0–34.0)
MCHC: 32.9 g/dL (ref 30.0–36.0)
MCV: 101.2 fL — ABNORMAL HIGH (ref 80.0–100.0)
Monocytes Absolute: 0.8 10*3/uL (ref 0.1–1.0)
Monocytes Relative: 15 %
Neutro Abs: 3.8 10*3/uL (ref 1.7–7.7)
Neutrophils Relative %: 68 %
Platelets: 221 10*3/uL (ref 150–400)
RBC: 4.33 MIL/uL (ref 4.22–5.81)
RDW: 15.1 % (ref 11.5–15.5)
WBC: 5.6 10*3/uL (ref 4.0–10.5)
nRBC: 0 % (ref 0.0–0.2)

## 2020-09-07 LAB — RESPIRATORY PANEL BY RT PCR (FLU A&B, COVID)
Influenza A by PCR: NEGATIVE
Influenza B by PCR: NEGATIVE
SARS Coronavirus 2 by RT PCR: NEGATIVE

## 2020-09-07 LAB — GLUCOSE, CAPILLARY: Glucose-Capillary: 107 mg/dL — ABNORMAL HIGH (ref 70–99)

## 2020-09-07 LAB — BRAIN NATRIURETIC PEPTIDE: B Natriuretic Peptide: 390.9 pg/mL — ABNORMAL HIGH (ref 0.0–100.0)

## 2020-09-07 IMAGING — DX DG CHEST 1V PORT
1 series · 1 of 1 positions shown · non-contrast
Comparison: [DATE]

CLINICAL DATA: Cough and shortness of breath

EXAM:
PORTABLE CHEST 1 VIEW

[chest ap]
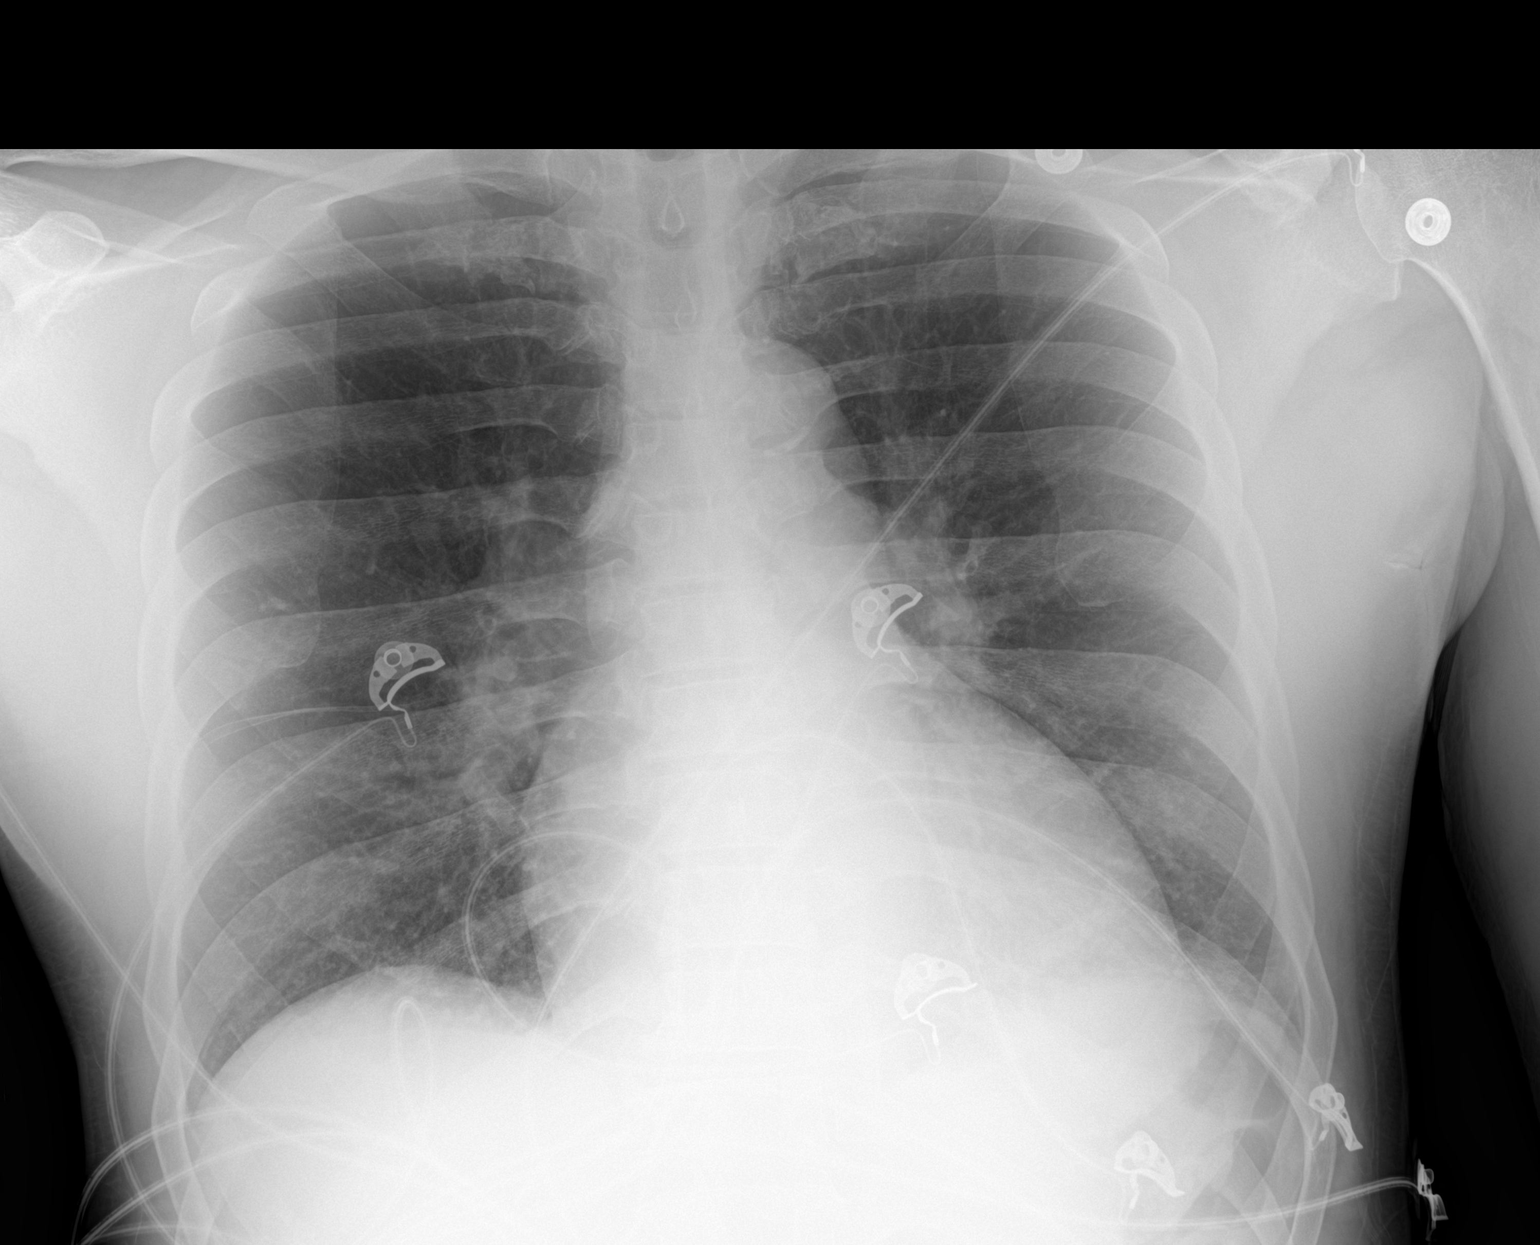

[1 of 1 positions shown; findings below may reference images not displayed]

FINDINGS: Lungs are clear. Heart is mildly enlarged with pulmonary vascularity
normal. No adenopathy. No bone lesions.
IMPRESSION: Mild cardiac enlargement, stable.  Lungs clear.

## 2020-09-07 MED ORDER — FUROSEMIDE 10 MG/ML IJ SOLN
40.0000 mg | Freq: Every day | INTRAMUSCULAR | Status: DC
  Administered 2020-09-08 – 2020-09-11 (×4): 40 mg via INTRAVENOUS
  Filled 2020-09-07 (×5): qty 4

## 2020-09-07 MED ORDER — ADULT MULTIVITAMIN W/MINERALS CH
1.0000 | ORAL_TABLET | Freq: Every day | ORAL | Status: DC
  Administered 2020-09-07 – 2020-09-09 (×2): 1 via ORAL
  Filled 2020-09-07 (×6): qty 1

## 2020-09-07 MED ORDER — FUROSEMIDE 10 MG/ML IJ SOLN
40.0000 mg | Freq: Once | INTRAMUSCULAR | Status: AC
  Administered 2020-09-07: 40 mg via INTRAVENOUS
  Filled 2020-09-07: qty 4

## 2020-09-07 MED ORDER — HYDRALAZINE HCL 25 MG PO TABS
25.0000 mg | ORAL_TABLET | Freq: Three times a day (TID) | ORAL | Status: DC
  Administered 2020-09-07 – 2020-09-11 (×10): 25 mg via ORAL
  Filled 2020-09-07 (×11): qty 1

## 2020-09-07 MED ORDER — ENOXAPARIN SODIUM 40 MG/0.4ML ~~LOC~~ SOLN
40.0000 mg | SUBCUTANEOUS | Status: DC
  Filled 2020-09-07: qty 0.4

## 2020-09-07 MED ORDER — UMECLIDINIUM BROMIDE 62.5 MCG/INH IN AEPB
1.0000 | INHALATION_SPRAY | Freq: Every day | RESPIRATORY_TRACT | Status: DC
  Administered 2020-09-08 – 2020-09-11 (×4): 1 via RESPIRATORY_TRACT
  Filled 2020-09-07: qty 7

## 2020-09-07 MED ORDER — NICOTINE 21 MG/24HR TD PT24
21.0000 mg | MEDICATED_PATCH | TRANSDERMAL | Status: DC
  Filled 2020-09-07 (×3): qty 1

## 2020-09-07 MED ORDER — ISOSORBIDE MONONITRATE ER 30 MG PO TB24
30.0000 mg | ORAL_TABLET | Freq: Every day | ORAL | Status: DC
  Administered 2020-09-08 – 2020-09-11 (×4): 30 mg via ORAL
  Filled 2020-09-07 (×5): qty 1

## 2020-09-07 MED ORDER — SODIUM CHLORIDE 0.9 % IV SOLN: INTRAVENOUS | Status: DC

## 2020-09-07 MED ORDER — OXYCODONE HCL 5 MG PO TABS
5.0000 mg | ORAL_TABLET | Freq: Once | ORAL | Status: AC
  Administered 2020-09-07: 5 mg via ORAL
  Filled 2020-09-07: qty 1

## 2020-09-07 MED ORDER — GABAPENTIN 300 MG PO CAPS
600.0000 mg | ORAL_CAPSULE | Freq: Three times a day (TID) | ORAL | Status: DC
  Administered 2020-09-07 – 2020-09-11 (×11): 600 mg via ORAL
  Filled 2020-09-07 (×11): qty 2

## 2020-09-07 MED ORDER — ATORVASTATIN CALCIUM 20 MG PO TABS
20.0000 mg | ORAL_TABLET | Freq: Every day | ORAL | Status: DC
  Administered 2020-09-07 – 2020-09-11 (×5): 20 mg via ORAL
  Filled 2020-09-07 (×6): qty 1

## 2020-09-07 MED ORDER — POTASSIUM CHLORIDE CRYS ER 20 MEQ PO TBCR
20.0000 meq | EXTENDED_RELEASE_TABLET | Freq: Every day | ORAL | Status: DC
  Administered 2020-09-08 – 2020-09-11 (×4): 20 meq via ORAL
  Filled 2020-09-07 (×5): qty 1

## 2020-09-07 MED ORDER — FOLIC ACID 1 MG PO TABS
1.0000 mg | ORAL_TABLET | Freq: Every day | ORAL | Status: DC
  Administered 2020-09-07 – 2020-09-10 (×3): 1 mg via ORAL
  Filled 2020-09-07 (×6): qty 1

## 2020-09-07 MED ORDER — GABAPENTIN 600 MG PO TABS
600.0000 mg | ORAL_TABLET | Freq: Three times a day (TID) | ORAL | Status: DC
  Filled 2020-09-07: qty 1

## 2020-09-07 MED ORDER — DIVALPROEX SODIUM ER 500 MG PO TB24
500.0000 mg | ORAL_TABLET | Freq: Two times a day (BID) | ORAL | Status: DC
  Filled 2020-09-07 (×9): qty 1

## 2020-09-07 MED ORDER — ALBUTEROL SULFATE HFA 108 (90 BASE) MCG/ACT IN AERS
1.0000 | INHALATION_SPRAY | Freq: Four times a day (QID) | RESPIRATORY_TRACT | Status: DC
  Administered 2020-09-07: 2 via RESPIRATORY_TRACT
  Filled 2020-09-07: qty 6.7

## 2020-09-07 MED ORDER — ALBUTEROL SULFATE HFA 108 (90 BASE) MCG/ACT IN AERS
1.0000 | INHALATION_SPRAY | Freq: Two times a day (BID) | RESPIRATORY_TRACT | Status: DC
  Administered 2020-09-08 – 2020-09-11 (×6): 2 via RESPIRATORY_TRACT
  Filled 2020-09-07: qty 6.7

## 2020-09-07 NOTE — ED Provider Notes (Signed)
[name]-EMERGENCY DEPT Provider Note   CSN: [id number] Arrival date & time: 09/07/20  0857     History Chief Complaint  Patient presents with  . Respiratory Distress    [name] is a 54 y.o. male.  54 year old male with history of CHF presents with increasing shortness of breath times several days.  He has had dyspnea on exertion as well as orthopnea.  Nonproductive cough without fever or chills.  He is currently homeless and has not been taking his medications.  Denies any anginal type chest pain.  Endorses lower extremity edema.  Called EMS and patient's pulse oximetry was in the 80s he was placed on supplemental oxygen.  His breathing is improved.        Past Medical History:  Diagnosis Date  . Asthma   . Bipolar 1 disorder (HCC)   . CHF (congestive heart failure) (HCC)   . Depression   . Homelessness   . Hypertension   . QT prolongation   . Schizoaffective disorder [facility])     Patient Active Problem List   Diagnosis Date Noted  . Near syncope 07/21/2020  . Schizoaffective disorder (HCC) 06/24/2019  . Shortness of breath   . CHF exacerbation (HCC) 06/21/2019  . QT prolongation 05/25/2019  . Cocaine abuse (HCC)   . Chest pain 05/24/2019  . Elevated troponin   . Homelessness   . Suicidal ideations   . Acute on chronic systolic heart failure (HCC) 04/18/2019  . Bilateral foot pain 04/05/2019  . Acute on chronic clinical systolic heart failure (HCC) 04/04/2019  . Housing situation unstable 03/28/2019  . AKI (acute kidney injury) (HCC)   . Bilateral lower extremity edema   . Acute on chronic HFrEF (heart failure with reduced ejection fraction) (HCC) 03/27/2019  . COPD exacerbation (HCC)   . Acute exacerbation of CHF (congestive heart failure) (HCC) 03/04/2019  . Asthma 12/26/2016  . Tobacco use disorder 12/26/2016  . Schizophrenia (HCC) 12/26/2016  . Cocaine abuse with cocaine-induced mood disorder (HCC) 09/06/2016  . Alcohol use  disorder, mild, abuse 07/31/2016  . Substance induced mood disorder (HCC) 07/30/2016  . Schizoaffective disorder, bipolar type (HCC) 05/31/2016  . Cocaine use disorder, severe, dependence (HCC) 05/31/2016  . Essential hypertension 05/31/2016    History reviewed. No pertinent surgical history.     Family History  Problem Relation Age of Onset  . Drug abuse Mother   . Suicidality Mother     Social History   Tobacco Use  . Smoking status: Current Every Day Smoker    Years: 30.00    Types: Cigarettes  . Smokeless tobacco: Never Used  Vaping Use  . Vaping Use: Never used  Substance Use Topics  . Alcohol use: Yes    Comment: infrequently  . Drug use: Yes    Types: Cocaine, Marijuana    Home Medications Prior to Admission medications   Medication Sig Start Date End Date Taking? Authorizing Provider  albuterol (PROVENTIL HFA;VENTOLIN HFA) 108 (90 Base) MCG/ACT inhaler Inhale 1-2 puffs into the lungs every 4 (four) hours as needed for shortness of breath. Patient not taking: Reported on 05/24/2019 08/22/18   Dione Booze, MD  ARIPiprazole (ABILIFY) 2 MG tablet Take 1 tablet (2 mg total) by mouth at bedtime. Patient not taking: Reported on 06/21/2019 05/26/19   Mirian Mo, MD  atorvastatin (LIPITOR) 20 MG tablet Take 1 tablet (20 mg total) by mouth daily. 07/24/20   Lauro Franklin, MD  divalproex (DEPAKOTE ER) 500 MG 24  hr tablet Take 1 tablet (500 mg total) by mouth in the morning and at bedtime. 07/16/20   Linwood Dibbles, MD  gabapentin (NEURONTIN) 600 MG tablet Take 1 tablet (600 mg total) by mouth 3 (three) times daily. Patient not taking: Reported on 07/21/2020 07/16/20   Linwood Dibbles, MD  hydrALAZINE (APRESOLINE) 25 MG tablet Take 1 tablet (25 mg total) by mouth every 8 (eight) hours. 07/23/20   Lauro Franklin, MD  isosorbide mononitrate (IMDUR) 30 MG 24 hr tablet Take 1 tablet (30 mg total) by mouth daily. 07/24/20   Lauro Franklin, MD  torsemide (DEMADEX) 20 MG  tablet Take 1 tablet (20 mg total) by mouth 2 (two) times daily. 07/16/20   Linwood Dibbles, MD  umeclidinium bromide (INCRUSE ELLIPTA) 62.5 MCG/INH AEPB Inhale 1 puff into the lungs daily. 07/23/20   Lauro Franklin, MD    Allergies    Ibuprofen and Acetaminophen  Review of Systems   Review of Systems  All other systems reviewed and are negative.   Physical Exam Updated Vital Signs Ht 1.753 m (5\' 9" )   Wt 84.4 kg   SpO2 100%   BMI 27.47 kg/m   Physical Exam Vitals and nursing note reviewed.  Constitutional:      General: He is not in acute distress.    Appearance: Normal appearance. He is well-developed. He is not toxic-appearing.  HENT:     Head: Normocephalic and atraumatic.  Eyes:     General: Lids are normal.     Conjunctiva/sclera: Conjunctivae normal.     Pupils: Pupils are equal, round, and reactive to light.  Neck:     Thyroid: No thyroid mass.     Trachea: No tracheal deviation.  Cardiovascular:     Rate and Rhythm: Normal rate and regular rhythm.     Heart sounds: Normal heart sounds. No murmur heard.  No gallop.   Pulmonary:     Effort: Tachypnea, prolonged expiration and respiratory distress present.     Breath sounds: No stridor. Examination of the right-lower field reveals decreased breath sounds and rales. Examination of the left-lower field reveals decreased breath sounds and rales. Decreased breath sounds and rales present. No wheezing or rhonchi.  Abdominal:     General: Bowel sounds are normal. There is no distension.     Palpations: Abdomen is soft.     Tenderness: There is no abdominal tenderness. There is no rebound.  Musculoskeletal:        General: No tenderness. Normal range of motion.     Cervical back: Normal range of motion and neck supple.  Skin:    General: Skin is warm and dry.     Findings: No abrasion or rash.  Neurological:     Mental Status: He is alert and oriented to person, place, and time.     GCS: GCS eye subscore is 4. GCS  verbal subscore is 5. GCS motor subscore is 6.     Cranial Nerves: No cranial nerve deficit.     Sensory: No sensory deficit.  Psychiatric:        Speech: Speech normal.        Behavior: Behavior normal.     ED Results / Procedures / Treatments   Labs (all labs ordered are listed, but only abnormal results are displayed) Labs Reviewed  RESPIRATORY PANEL BY RT PCR (FLU A&B, COVID)  CBC WITH DIFFERENTIAL/PLATELET  COMPREHENSIVE METABOLIC PANEL  BRAIN NATRIURETIC PEPTIDE    EKG None  Radiology No  results found.  Procedures Procedures (including critical care time)  Medications Ordered in ED Medications  0.9 %  sodium chloride infusion (has no administration in time range)    ED Course  I have reviewed the triage vital signs and the nursing notes.  Pertinent labs & imaging results that were available during my care of the patient were reviewed by me and considered in my medical decision making (see chart for details).    MDM Rules/Calculators/A&P                          Patient with likely CHF exacerbation.  Given Lasix here. Will admit to medicine Final Clinical Impression(s) / ED Diagnoses Final diagnoses:  None    Rx / DC Orders ED Discharge Orders    None       Lorre Nick, MD 09/07/20 1110

## 2020-09-07 NOTE — H&P (Addendum)
History and Physical    [name] YBO:[id number] DOB: 1966/01/03 DOA: 09/07/2020  PCP: Patient, No Pcp Per  Patient coming from: ?Homeless  Chief Complaint: Dyspnea  HPI: [name] is a 54 y.o. male with medical history significant of HFrEF, DM2, DM neuropathy. Presents with 2 week history of increasing dyspnea w/ worsening over the last 2 days. He reports non-productive cough. He reports worsening of dyspnea w/ bending over. He reports some associated BLE edema. He states that this issue has been present since the last time he visited the hospital. He says during that visit, he was taken off furosemide for an unknown reason. He also reports that he is unable to obtain his medications and that he primarily gets his care through the ER. He became concerned as he has found it more difficult to walk and called for EMS to bring him to the ED. He denies any other alleviating or aggravating factors. He denies any other treatments.    ED Course: He was placed on supplemental O2. He was found to have an elevated BNP. He was given 40mg  lasix IV. TR[facility] was called for admission.   Review of Systems:  He denies CP, palpitations, N, V. He reports foot pain. Review of systems is otherwise negative for all not mentioned in HPI.   PMHx Past Medical History:  Diagnosis Date  . Asthma   . Bipolar 1 disorder (HCC)   . CHF (congestive heart failure) (HCC)   . Depression   . Homelessness   . Hypertension   . QT prolongation   . Schizoaffective disorder (HCC)     PSHx History reviewed. No pertinent surgical history.  SocHx  reports that he has been smoking cigarettes. He has smoked for the past 30.00 years. He has never used smokeless tobacco. He reports current alcohol use. He reports current drug use. Drugs: Cocaine and Marijuana.  Allergies  Allergen Reactions  . Ibuprofen Anaphylaxis  . Acetaminophen Hives, Nausea And Vomiting and Rash    Pt overdosed on bottle of APAP 05/06/16      FamHx Family History  Problem Relation Age of Onset  . Drug abuse Mother   . Suicidality Mother     Prior to Admission medications   Medication Sig Start Date End Date Taking? Authorizing Provider  albuterol (PROVENTIL HFA;VENTOLIN HFA) 108 (90 Base) MCG/ACT inhaler Inhale 1-2 puffs into the lungs every 4 (four) hours as needed for shortness of breath. Patient not taking: Reported on 05/24/2019 08/22/18   08/24/18, MD  ARIPiprazole (ABILIFY) 2 MG tablet Take 1 tablet (2 mg total) by mouth at bedtime. Patient not taking: Reported on 06/21/2019 05/26/19   05/28/19, MD  atorvastatin (LIPITOR) 20 MG tablet Take 1 tablet (20 mg total) by mouth daily. Patient not taking: Reported on 09/07/2020 07/24/20   07/26/20, MD  divalproex (DEPAKOTE ER) 500 MG 24 hr tablet Take 1 tablet (500 mg total) by mouth in the morning and at bedtime. Patient not taking: Reported on 09/07/2020 07/16/20   07/18/20, MD  gabapentin (NEURONTIN) 600 MG tablet Take 1 tablet (600 mg total) by mouth 3 (three) times daily. Patient not taking: Reported on 07/21/2020 07/16/20   07/18/20, MD  hydrALAZINE (APRESOLINE) 25 MG tablet Take 1 tablet (25 mg total) by mouth every 8 (eight) hours. Patient not taking: Reported on 09/07/2020 07/23/20   07/25/20, MD  isosorbide mononitrate (IMDUR) 30 MG 24 hr tablet Take 1 tablet (30 mg total) by mouth  daily. Patient not taking: Reported on 09/07/2020 07/24/20   Lauro Franklin, MD  torsemide (DEMADEX) 20 MG tablet Take 1 tablet (20 mg total) by mouth 2 (two) times daily. Patient not taking: Reported on 09/07/2020 07/16/20   Linwood Dibbles, MD  umeclidinium bromide (INCRUSE ELLIPTA) 62.5 MCG/INH AEPB Inhale 1 puff into the lungs daily. Patient not taking: Reported on 09/07/2020 07/23/20   Lauro Franklin, MD    Physical Exam: Vitals:   09/07/20 0911 09/07/20 0926 09/07/20 1014 09/07/20 1053  BP:  (!) 150/112 (!) 165/106 (!) 168/116  Pulse:  76 78 82   Resp:  (!) 28 19 19   Temp:  98.2 F (36.8 C)    TempSrc:  Oral    SpO2:  97% 97% 98%  Weight: 84.4 kg     Height: 5\' 9"  (1.753 m)       General: 54 y.o. male resting in bed in NAD Eyes: PERRL, normal sclera ENMT: Nares patent w/o discharge, orophaynx clear, dentition normal, ears w/o discharge/lesions/ulcers Neck: Supple, trachea midline Cardiovascular: RRR, +S1, S2, no m/g/r, equal pulses throughout Respiratory: decreased at bases w/ some expiratory wheeze, no r/r, normal WOB on 5L [place] GI: BS+, NDNT, no masses noted, no organomegaly noted MSK: No e/c/c Skin: No rashes, bruises, ulcerations noted Neuro: A&O x 3, no focal deficits Psyc: Appropriate interaction and affect, calm/cooperative  Labs on Admission: I have personally reviewed following labs and imaging studies  CBC: Recent Labs  Lab 09/07/20 0953  WBC 5.6  NEUTROABS 3.8  HGB 14.4  HCT 43.8  MCV 101.2*  PLT 221   Basic Metabolic Panel: Recent Labs  Lab 09/07/20 0953  NA 140  K 3.8  CL 102  CO2 21*  GLUCOSE 69*  BUN 12  CREATININE 1.15  CALCIUM 8.9   GFR: Estimated Creatinine Clearance: 75.1 mL/min (by C-G formula based on SCr of 1.15 mg/dL). Liver Function Tests: Recent Labs  Lab 09/07/20 0953  AST 30  ALT 23  ALKPHOS 68  BILITOT 1.6*  PROT 6.9  ALBUMIN 3.7   No results for input(s): LIPASE, AMYLASE in the last 168 hours. No results for input(s): AMMONIA in the last 168 hours. Coagulation Profile: No results for input(s): INR, PROTIME in the last 168 hours. Cardiac Enzymes: No results for input(s): CKTOTAL, CKMB, CKMBINDEX, TROPONINI in the last 168 hours. BNP (last 3 results) No results for input(s): PROBNP in the last 8760 hours. HbA1C: No results for input(s): HGBA1C in the last 72 hours. CBG: No results for input(s): GLUCAP in the last 168 hours. Lipid Profile: No results for input(s): CHOL, HDL, LDLCALC, TRIG, CHOLHDL, LDLDIRECT in the last 72 hours. Thyroid Function Tests: No  results for input(s): TSH, T4TOTAL, FREET4, T3FREE, THYROIDAB in the last 72 hours. Anemia Panel: No results for input(s): VITAMINB12, FOLATE, FERRITIN, TIBC, IRON, RETICCTPCT in the last 72 hours. Urine analysis:    Component Value Date/Time   COLORURINE YELLOW 08/22/2020 1127   APPEARANCEUR CLEAR 08/22/2020 1127   LABSPEC 1.010 08/22/2020 1127   PHURINE 5.0 08/22/2020 1127   GLUCOSEU NEGATIVE 08/22/2020 1127   HGBUR NEGATIVE 08/22/2020 1127   BILIRUBINUR NEGATIVE 08/22/2020 1127   KETONESUR NEGATIVE 08/22/2020 1127   PROTEINUR NEGATIVE 08/22/2020 1127   NITRITE NEGATIVE 08/22/2020 1127   LEUKOCYTESUR NEGATIVE 08/22/2020 1127    Radiological Exams on Admission: DG Chest Port 1 View  Result Date: 09/07/2020 CLINICAL DATA:  Cough and shortness of breath EXAM: PORTABLE CHEST 1 VIEW COMPARISON:  August 21, 2020  FINDINGS: Lungs are clear. Heart is mildly enlarged with pulmonary vascularity normal. No adenopathy. No bone lesions. IMPRESSION: Mild cardiac enlargement, stable.  Lungs clear. Electronically Signed   By: Bretta Bang III M.D.   On: 09/07/2020 09:38    EKG: Independently reviewed. Sinus rhythm w/ prolonged QT  Assessment/Plan Acute on chronic HFrEF Dyspnea     - admit to obs, telemetry     - wean O2 as able     - received IV lasix 40mg  in ED; continue daily dose; depending on respiratory condition, can consider transition to PO in the AM     - a function of medical non-compliance     - last echo from 07/2020 shows improvement of his HF (EF improved from 15% to 40%)     - continue imdur/hydralazine     - will consult TOC for medication assistance/PCP assistance     - fluid restrict to 1500cc, Na+ restriction  HTN     - continue lasix, imdur/hydralazine  DM2 DM neuropathy Hypoglycemia     - DM2 diet, glucose checks     - continue gabapentin     - hold SSI for right now, can add if he shifts to hyperglycemic side  Macrocytosis     - check B12,  THF  Tobacco abuse Asthma     - counseled against further tobacco use     - nicotine patch     - was previously prescribed incruse ellipta; will continue it for now  Schizoaffective Disorder     - continue depakote; hold abilify d/t prolonged QT  HLD     - continue lipitor  Prolonged QT (chronic)     - electrolyte goals K+ 4.0, Mg2+ 2.0     - check Mg 2+     - watch for Qt prolonging medications  Goals of Care     - patient has confirmed DNR status  DVT prophylaxis: lovenox  Code Status: DNR  Family Communication: None at bedside.  Consults called: None  Admission status: Observation  Status is: Observation  The patient remains OBS appropriate and will d/c before 2 midnights.  Dispo: The patient is from: Homeless              Anticipated d/c is to: Shelter              Anticipated d/c date is: 1 day              Patient currently is not medically stable to d/c.  08/2020 DO [facility]  If 7PM-7AM, please contact night-coverage [email or web address]  09/07/2020, 11:50 AM

## 2020-09-07 NOTE — Progress Notes (Signed)
Patient arrived to unit. RN attempted to place initiate telemetry monitor and vital signs. Patient refused and request RN to return after meal. Education provided. Pt is not agreeable.

## 2020-09-07 NOTE — ED Triage Notes (Signed)
Per EMS- [place] and [place] EMS for SOB. Patient's initial O2 Sats were 81% initially. Patient was placed on O2 6L/min via [place]-Sats 100%.

## 2020-09-08 DIAGNOSIS — I5020 Unspecified systolic (congestive) heart failure: Secondary | ICD-10-CM

## 2020-09-08 LAB — GLUCOSE, CAPILLARY
Glucose-Capillary: 109 mg/dL — ABNORMAL HIGH (ref 70–99)
Glucose-Capillary: 98 mg/dL (ref 70–99)

## 2020-09-08 MED ORDER — TRAMADOL HCL 50 MG PO TABS
50.0000 mg | ORAL_TABLET | Freq: Four times a day (QID) | ORAL | Status: DC | PRN
  Administered 2020-09-08 – 2020-09-10 (×7): 50 mg via ORAL
  Filled 2020-09-08 (×7): qty 1

## 2020-09-08 MED ORDER — ALBUTEROL SULFATE (2.5 MG/3ML) 0.083% IN NEBU
2.5000 mg | INHALATION_SOLUTION | RESPIRATORY_TRACT | Status: DC | PRN
  Administered 2020-09-08: 2.5 mg via RESPIRATORY_TRACT
  Filled 2020-09-08: qty 3

## 2020-09-08 NOTE — Progress Notes (Signed)
Patient refuses medication and assessment. States "you didn't bring me a drink with my medicine, get out I'll get another nurse'." CN and Administrative Coordinator both aware; both have been in to speak with patient at patient's request throughout the morning. CN will take over care of patient at this time.

## 2020-09-08 NOTE — Progress Notes (Signed)
Patient refusing CBG check and lab draws this AM.  Dr. Alvino Chapel aware. Will continue to monitor.

## 2020-09-08 NOTE — Progress Notes (Signed)
Patient requested to speak to the supervisor.  He is not happy with his care, related to meds, diet, rounding.  MD has spoken to him at length about his medication regiment and his refusing meds and labs.  Charge has spoken to him both in ED prior to transfer to the floor and the floor charge RN.  Requesting to transfer to [facility] but I explained that they do not have bed space and his level of care does not warrant transfer to [facility].  Over all he is very irritable and short tempered.  Concerned about possible violent out bursts.

## 2020-09-08 NOTE — Progress Notes (Signed)
Patient c/o SOB.  O2 sats 100% on RA, normal work of breathing.  Also c/o back and foot pain.  Dr. Alvino Chapel notified.

## 2020-09-08 NOTE — Progress Notes (Signed)
PROGRESS NOTE    [name]  GEX:[id number] DOB: 01-10-1966 DOA: 09/07/2020 PCP: Patient, No Pcp Per     Brief Narrative:  [name] is a 54 y.o. male with medical history significant of HFrEF, DM2, DM neuropathy. Presents with 2 week history of increasing dyspnea w/ worsening over the last 2 days. He reports non-productive cough. He reports worsening of dyspnea w/ bending over. He reports some associated BLE edema. He states that this issue has been present since the last time he visited the hospital. He says during that visit, he was taken off furosemide for an unknown reason. He also reports that he is unable to obtain his medications and that he primarily gets his care through the ER. He became concerned as he has found it more difficult to walk and called for EMS to bring him to the ED. He denies any other alleviating or aggravating factors. He denies any other treatments.   Patient was found to have elevated BNP and was started on IV Lasix.  New events last 24 hours / Subjective: Patient states that we are not doing anything for his neuropathy, that we are not soaking his feet, not doing anything for his shortness of breath, wants a regular diet instead of heart healthy.  He states that he does not take his medications as he does not have them, he is homeless and states that he often will have his backpack stolen or lost.  He is refusing morning blood work, states that we do not know what were doing and digging around his veins.  He is overall very unhappy with his medical care  Assessment & Plan:   Active Problems:   HFrEF (heart failure with reduced ejection fraction) (HCC)   Acute on chronic combined systolic and diastolic heart failure -BNP elevated 325.2 -Continue IV Lasix -Strict I's and O's, daily weights  Demand ischemia -Troponin 33, secondary to above  Essential hypertension -Continue Lasix, hydralazine  CAD -Continue Imdur  Type 2 diabetes, w complications  including neuropathy -Continue gabapentin  Schizoaffective disorder -Valproic acid level <10, likely secondary to noncompliance -Continue Depakote  Hyperlipidemia -Continue Lipitor  Medical noncompliance -TOC consult for medication and PCP assistance   DVT prophylaxis:  enoxaparin (LOVENOX) injection 40 mg Start: 09/07/20 2200  Code Status: DNR Family Communication: No family at bedside Disposition Plan:  Status is: Observation  The patient will require care spanning > 2 midnights and should be moved to inpatient because: IV treatments appropriate due to intensity of illness or inability to take PO  Dispo: The patient is from: Home              Anticipated d/c is to: Home              Anticipated d/c date is: 1 day              Patient currently is not medically stable to d/c.  Remains on IV lasix.    Antimicrobials:  Anti-infectives (From admission, onward)   None        Objective: Vitals:   09/07/20 1954 09/07/20 2025 09/08/20 0451 09/08/20 0618  BP:  120/72 (!) 156/113   Pulse:  86 77   Resp:  20    Temp:  98.1 F (36.7 C) 97.7 F (36.5 C)   TempSrc:  Oral Oral   SpO2: 97% 94% 100% 98%  Weight:   87.2 kg   Height:        Intake/Output Summary (Last 24 hours)  at 09/08/2020 1044 Last data filed at 09/08/2020 5093 Gross per 24 hour  Intake 1203.33 ml  Output 1900 ml  Net -696.67 ml   Filed Weights   09/07/20 0911 09/08/20 0451  Weight: 84.4 kg 87.2 kg    Examination: Was not able to perform full physical exam due to patient's aggression   General exam: Appears irritated, no acute distress, on room air without conversational dyspnea  Psychiatry: Judgement and insight appear poor   Data Reviewed: I have personally reviewed following labs and imaging studies  CBC: Recent Labs  Lab 09/07/20 0953  WBC 5.6  NEUTROABS 3.8  HGB 14.4  HCT 43.8  MCV 101.2*  PLT 221   Basic Metabolic Panel: Recent Labs  Lab 09/07/20 0953  NA 140  K 3.8  CL  102  CO2 21*  GLUCOSE 69*  BUN 12  CREATININE 1.15  CALCIUM 8.9   GFR: Estimated Creatinine Clearance: 82.2 mL/min (by C-G formula based on SCr of 1.15 mg/dL). Liver Function Tests: Recent Labs  Lab 09/07/20 0953  AST 30  ALT 23  ALKPHOS 68  BILITOT 1.6*  PROT 6.9  ALBUMIN 3.7   No results for input(s): LIPASE, AMYLASE in the last 168 hours. No results for input(s): AMMONIA in the last 168 hours. Coagulation Profile: No results for input(s): INR, PROTIME in the last 168 hours. Cardiac Enzymes: No results for input(s): CKTOTAL, CKMB, CKMBINDEX, TROPONINI in the last 168 hours. BNP (last 3 results) No results for input(s): PROBNP in the last 8760 hours. HbA1C: No results for input(s): HGBA1C in the last 72 hours. CBG: Recent Labs  Lab 09/07/20 2022  GLUCAP 107*   Lipid Profile: No results for input(s): CHOL, HDL, LDLCALC, TRIG, CHOLHDL, LDLDIRECT in the last 72 hours. Thyroid Function Tests: No results for input(s): TSH, T4TOTAL, FREET4, T3FREE, THYROIDAB in the last 72 hours. Anemia Panel: No results for input(s): VITAMINB12, FOLATE, FERRITIN, TIBC, IRON, RETICCTPCT in the last 72 hours. Sepsis Labs: No results for input(s): PROCALCITON, LATICACIDVEN in the last 168 hours.  Recent Results (from the past 240 hour(s))  Respiratory Panel by RT PCR (Flu A&B, Covid) - Nasopharyngeal Swab     Status: None   Collection Time: 09/07/20  9:53 AM   Specimen: Nasopharyngeal Swab  Result Value Ref Range Status   SARS Coronavirus 2 by RT PCR NEGATIVE NEGATIVE Final    Comment: (NOTE) SARS-CoV-2 target nucleic acids are NOT DETECTED.  The SARS-CoV-2 RNA is generally detectable in upper respiratoy specimens during the acute phase of infection. The lowest concentration of SARS-CoV-2 viral copies this assay can detect is 131 copies/mL. A negative result does not preclude SARS-Cov-2 infection and should not be used as the sole basis for treatment or other patient management  decisions. A negative result may occur with  improper specimen collection/handling, submission of specimen other than nasopharyngeal swab, presence of viral mutation(s) within the areas targeted by this assay, and inadequate number of viral copies (<131 copies/mL). A negative result must be combined with clinical observations, patient history, and epidemiological information. The expected result is Negative.  Fact Sheet for Patients:  https://www.moore.com/  Fact Sheet for Healthcare Providers:  https://www.young.biz/  This test is no t yet approved or cleared by the [place] FDA and  has been authorized for detection and/or diagnosis of SARS-CoV-2 by FDA under an Emergency Use Authorization (EUA). This EUA will remain  in effect (meaning this test can be used) for the duration of the COVID-19 declaration under Section  564(b)(1) of the Act, 21 U.S.C. section 360bbb-3(b)(1), unless the authorization is terminated or revoked sooner.     Influenza A by PCR NEGATIVE NEGATIVE Final   Influenza B by PCR NEGATIVE NEGATIVE Final    Comment: (NOTE) The Xpert Xpress SARS-CoV-2/FLU/RSV assay is intended as an aid in  the diagnosis of influenza from Nasopharyngeal swab specimens and  should not be used as a sole basis for treatment. Nasal washings and  aspirates are unacceptable for Xpert Xpress SARS-CoV-2/FLU/RSV  testing.  Fact Sheet for Patients: https://www.moore.com/  Fact Sheet for Healthcare Providers: https://www.young.biz/  This test is not yet approved or cleared by the [place] FDA and  has been authorized for detection and/or diagnosis of SARS-CoV-2 by  FDA under an Emergency Use Authorization (EUA). This EUA will remain  in effect (meaning this test can be used) for the duration of the  Covid-19 declaration under Section 564(b)(1) of the Act, 21  U.S.C. section 360bbb-3(b)(1), unless the  authorization is  terminated or revoked. Performed at [facility], [address], [place], [name] [address]       Radiology Studies: [facility] Chest Port 1 View  Result Date: 09/07/2020 CLINICAL DATA:  Cough and shortness of breath EXAM: PORTABLE CHEST 1 VIEW COMPARISON:  August 21, 2020 FINDINGS: Lungs are clear. Heart is mildly enlarged with pulmonary vascularity normal. No adenopathy. No bone lesions. IMPRESSION: Mild cardiac enlargement, stable.  Lungs clear. Electronically Signed   By: Bretta Bang III M.D.   On: 09/07/2020 09:38      Scheduled Meds: . albuterol  1-2 puff Inhalation BID  . atorvastatin  20 mg Oral Daily  . divalproex  500 mg Oral BID  . enoxaparin (LOVENOX) injection  40 mg Subcutaneous Q24H  . folic acid  1 mg Oral Daily  . furosemide  40 mg Intravenous Daily  . gabapentin  600 mg Oral TID  . hydrALAZINE  25 mg Oral Q8H  . isosorbide mononitrate  30 mg Oral Daily  . multivitamin with minerals  1 tablet Oral Daily  . nicotine  21 mg Transdermal Q24H  . potassium chloride  20 mEq Oral Daily  . umeclidinium bromide  1 puff Inhalation Daily   Continuous Infusions: . sodium chloride 10 mL/hr at 09/07/20 1825     LOS: 0 days      Time spent: 40 minutes   Noralee Stain, DO [facility] 09/08/2020, 10:44 AM   Available via Epic secure chat 7am-7pm After these hours, please refer to coverage provider listed on [email or web address]

## 2020-09-08 NOTE — Progress Notes (Signed)
Patient agreed to take his morning medications, except his Multivitamin, Depakote and Folic Acid.  Dr. Alvino Chapel notified.

## 2020-09-09 DIAGNOSIS — E785 Hyperlipidemia, unspecified: Secondary | ICD-10-CM | POA: Diagnosis present

## 2020-09-09 DIAGNOSIS — I5043 Acute on chronic combined systolic (congestive) and diastolic (congestive) heart failure: Secondary | ICD-10-CM | POA: Diagnosis present

## 2020-09-09 DIAGNOSIS — I110 Hypertensive heart disease with heart failure: Secondary | ICD-10-CM | POA: Diagnosis present

## 2020-09-09 DIAGNOSIS — E1140 Type 2 diabetes mellitus with diabetic neuropathy, unspecified: Secondary | ICD-10-CM | POA: Diagnosis present

## 2020-09-09 DIAGNOSIS — Z813 Family history of other psychoactive substance abuse and dependence: Secondary | ICD-10-CM | POA: Diagnosis not present

## 2020-09-09 DIAGNOSIS — Z79899 Other long term (current) drug therapy: Secondary | ICD-10-CM | POA: Diagnosis not present

## 2020-09-09 DIAGNOSIS — Z20822 Contact with and (suspected) exposure to covid-19: Secondary | ICD-10-CM | POA: Diagnosis present

## 2020-09-09 DIAGNOSIS — F259 Schizoaffective disorder, unspecified: Secondary | ICD-10-CM | POA: Diagnosis present

## 2020-09-09 DIAGNOSIS — I5020 Unspecified systolic (congestive) heart failure: Secondary | ICD-10-CM | POA: Diagnosis present

## 2020-09-09 DIAGNOSIS — F1911 Other psychoactive substance abuse, in remission: Secondary | ICD-10-CM | POA: Diagnosis present

## 2020-09-09 DIAGNOSIS — J449 Chronic obstructive pulmonary disease, unspecified: Secondary | ICD-10-CM | POA: Diagnosis present

## 2020-09-09 DIAGNOSIS — I2510 Atherosclerotic heart disease of native coronary artery without angina pectoris: Secondary | ICD-10-CM | POA: Diagnosis present

## 2020-09-09 DIAGNOSIS — E11649 Type 2 diabetes mellitus with hypoglycemia without coma: Secondary | ICD-10-CM | POA: Diagnosis present

## 2020-09-09 DIAGNOSIS — Z5900 Homelessness unspecified: Secondary | ICD-10-CM | POA: Diagnosis not present

## 2020-09-09 DIAGNOSIS — Z9119 Patient's noncompliance with other medical treatment and regimen: Secondary | ICD-10-CM | POA: Diagnosis not present

## 2020-09-09 DIAGNOSIS — F319 Bipolar disorder, unspecified: Secondary | ICD-10-CM | POA: Diagnosis present

## 2020-09-09 DIAGNOSIS — J45909 Unspecified asthma, uncomplicated: Secondary | ICD-10-CM | POA: Diagnosis present

## 2020-09-09 DIAGNOSIS — D7589 Other specified diseases of blood and blood-forming organs: Secondary | ICD-10-CM | POA: Diagnosis present

## 2020-09-09 DIAGNOSIS — I248 Other forms of acute ischemic heart disease: Secondary | ICD-10-CM | POA: Diagnosis present

## 2020-09-09 DIAGNOSIS — F17210 Nicotine dependence, cigarettes, uncomplicated: Secondary | ICD-10-CM | POA: Diagnosis present

## 2020-09-09 DIAGNOSIS — Z66 Do not resuscitate: Secondary | ICD-10-CM | POA: Diagnosis present

## 2020-09-09 LAB — CBC
HCT: 46.2 % (ref 39.0–52.0)
Hemoglobin: 15.4 g/dL (ref 13.0–17.0)
MCH: 33 pg (ref 26.0–34.0)
MCHC: 33.3 g/dL (ref 30.0–36.0)
MCV: 98.9 fL (ref 80.0–100.0)
Platelets: 188 10*3/uL (ref 150–400)
RBC: 4.67 MIL/uL (ref 4.22–5.81)
RDW: 15.1 % (ref 11.5–15.5)
WBC: 3.7 10*3/uL — ABNORMAL LOW (ref 4.0–10.5)
nRBC: 0 % (ref 0.0–0.2)

## 2020-09-09 LAB — GLUCOSE, CAPILLARY
Glucose-Capillary: 101 mg/dL — ABNORMAL HIGH (ref 70–99)
Glucose-Capillary: 147 mg/dL — ABNORMAL HIGH (ref 70–99)
Glucose-Capillary: 89 mg/dL (ref 70–99)
Glucose-Capillary: 106 mg/dL — ABNORMAL HIGH (ref 70–99)

## 2020-09-09 LAB — BASIC METABOLIC PANEL
Anion gap: 12 (ref 5–15)
BUN: 16 mg/dL (ref 6–20)
CO2: 23 mmol/L (ref 22–32)
Calcium: 9.2 mg/dL (ref 8.9–10.3)
Chloride: 102 mmol/L (ref 98–111)
Creatinine, Ser: 1.17 mg/dL (ref 0.61–1.24)
GFR, Estimated: >60 mL/min (ref >60)
Glucose, Bld: 125 mg/dL — ABNORMAL HIGH (ref 70–99)
Potassium: 3.8 mmol/L (ref 3.5–5.1)
Sodium: 137 mmol/L (ref 135–145)

## 2020-09-09 LAB — HEMOGLOBIN A1C
Hgb A1c MFr Bld: 5.9 % — ABNORMAL HIGH (ref 4.8–5.6)
Mean Plasma Glucose: 122.63 mg/dL

## 2020-09-09 MED ORDER — INSULIN ASPART 100 UNIT/ML ~~LOC~~ SOLN: 0.0000 [IU] | Freq: Three times a day (TID) | SUBCUTANEOUS | Status: DC

## 2020-09-09 NOTE — Progress Notes (Addendum)
PROGRESS NOTE    [name]  OFB:[id number] DOB: 09-27-1966 DOA: 09/07/2020 PCP: Patient, No Pcp Per     Brief Narrative:  [name] is a 54 y.o. male with medical history significant of HFrEF, DM2, DM neuropathy. Presents with 2 week history of increasing dyspnea w/ worsening over the last 2 days. He reports non-productive cough. He reports worsening of dyspnea w/ bending over. He reports some associated BLE edema. He states that this issue has been present since the last time he visited the hospital. He says during that visit, he was taken off furosemide for an unknown reason. He also reports that he is unable to obtain his medications and that he primarily gets his care through the ER. He became concerned as he has found it more difficult to walk and called for EMS to bring him to the ED. He denies any other alleviating or aggravating factors. He denies any other treatments.   Patient was found to have elevated BNP and was started on IV Lasix.  New events last 24 hours / Subjective: More cooperative today. Still with some SOB. Ok with [place] trying for lab work today. Asked for double meal tray. UOP 2.9L over last 24 hours.   Assessment & Plan:   Active Problems:   HFrEF (heart failure with reduced ejection fraction) (HCC)   Acute on chronic combined systolic and diastolic heart failure -BNP elevated 325.2 -Continue IV Lasix -Strict I's and O's, daily weights  Demand ischemia -Troponin 33, secondary to above  Essential hypertension -Continue Lasix, hydralazine  CAD -Continue Imdur  Type 2 diabetes, w complications including neuropathy -Continue gabapentin  Schizoaffective disorder -Valproic acid level <10, likely secondary to noncompliance -Continue Depakote  Hyperlipidemia -Continue Lipitor  Medical noncompliance -TOC consult for medication and PCP assistance   DVT prophylaxis:    Code Status: Full Family Communication: No family at bedside Disposition  Plan:  Status is: Observation  The patient will require care spanning > 2 midnights and should be moved to inpatient because: IV treatments appropriate due to intensity of illness or inability to take PO  Dispo: The patient is from: Home              Anticipated d/c is to: Home              Anticipated d/c date is: 1 day              Patient currently is not medically stable to d/c.  Remains on IV lasix.    Antimicrobials:  Anti-infectives (From admission, onward)   None       Objective: Vitals:   09/08/20 2145 09/09/20 0420 09/09/20 0425 09/09/20 0637  BP: (!) 116/110 (!) 131/106    Pulse: 76 (!) 102    Resp: 19 19    Temp: 98.3 F (36.8 C) (!) 97.5 F (36.4 C)    TempSrc: Axillary Oral    SpO2: 100% 100%  98%  Weight:   87.2 kg   Height:        Intake/Output Summary (Last 24 hours) at 09/09/2020 1156 Last data filed at 09/09/2020 0925 Gross per 24 hour  Intake 610 ml  Output 2950 ml  Net -2340 ml   Filed Weights   09/07/20 0911 09/08/20 0451 09/09/20 0425  Weight: 84.4 kg 87.2 kg 87.2 kg    Examination: General exam: Appears calm and comfortable  Respiratory system: Wheezes bilateral bases, without rhonchi, on room air  Cardiovascular system: S1 & S2 heard, RRR.  No pedal edema. Gastrointestinal system: Abdomen is nondistended, soft and nontender. Normal bowel sounds heard. Central nervous system: Alert and oriented. Non focal exam. Speech clear  Extremities: Symmetric in appearance bilaterally  Psychiatry: Judgement and insight appear poor but stable   Data Reviewed: I have personally reviewed following labs and imaging studies  CBC: Recent Labs  Lab 09/07/20 0953  WBC 5.6  NEUTROABS 3.8  HGB 14.4  HCT 43.8  MCV 101.2*  PLT 221   Basic Metabolic Panel: Recent Labs  Lab 09/07/20 0953  NA 140  K 3.8  CL 102  CO2 21*  GLUCOSE 69*  BUN 12  CREATININE 1.15  CALCIUM 8.9   GFR: Estimated Creatinine Clearance: 82.2 mL/min (by C-G formula  based on SCr of 1.15 mg/dL). Liver Function Tests: Recent Labs  Lab 09/07/20 0953  AST 30  ALT 23  ALKPHOS 68  BILITOT 1.6*  PROT 6.9  ALBUMIN 3.7   No results for input(s): LIPASE, AMYLASE in the last 168 hours. No results for input(s): AMMONIA in the last 168 hours. Coagulation Profile: No results for input(s): INR, PROTIME in the last 168 hours. Cardiac Enzymes: No results for input(s): CKTOTAL, CKMB, CKMBINDEX, TROPONINI in the last 168 hours. BNP (last 3 results) No results for input(s): PROBNP in the last 8760 hours. HbA1C: No results for input(s): HGBA1C in the last 72 hours. CBG: Recent Labs  Lab 09/07/20 2022 09/08/20 1718 09/08/20 2158 09/09/20 0741  GLUCAP 107* 109* 98 101*   Lipid Profile: No results for input(s): CHOL, HDL, LDLCALC, TRIG, CHOLHDL, LDLDIRECT in the last 72 hours. Thyroid Function Tests: No results for input(s): TSH, T4TOTAL, FREET4, T3FREE, THYROIDAB in the last 72 hours. Anemia Panel: No results for input(s): VITAMINB12, FOLATE, FERRITIN, TIBC, IRON, RETICCTPCT in the last 72 hours. Sepsis Labs: No results for input(s): PROCALCITON, LATICACIDVEN in the last 168 hours.  Recent Results (from the past 240 hour(s))  Respiratory Panel by RT PCR (Flu A&B, Covid) - Nasopharyngeal Swab     Status: None   Collection Time: 09/07/20  9:53 AM   Specimen: Nasopharyngeal Swab  Result Value Ref Range Status   SARS Coronavirus 2 by RT PCR NEGATIVE NEGATIVE Final    Comment: (NOTE) SARS-CoV-2 target nucleic acids are NOT DETECTED.  The SARS-CoV-2 RNA is generally detectable in upper respiratoy specimens during the acute phase of infection. The lowest concentration of SARS-CoV-2 viral copies this assay can detect is 131 copies/mL. A negative result does not preclude SARS-Cov-2 infection and should not be used as the sole basis for treatment or other patient management decisions. A negative result may occur with  improper specimen collection/handling,  submission of specimen other than nasopharyngeal swab, presence of viral mutation(s) within the areas targeted by this assay, and inadequate number of viral copies (<131 copies/mL). A negative result must be combined with clinical observations, patient history, and epidemiological information. The expected result is Negative.  Fact Sheet for Patients:  https://www.moore.com/  Fact Sheet for Healthcare Providers:  https://www.young.biz/  This test is no t yet approved or cleared by the [place] FDA and  has been authorized for detection and/or diagnosis of SARS-CoV-2 by FDA under an Emergency Use Authorization (EUA). This EUA will remain  in effect (meaning this test can be used) for the duration of the COVID-19 declaration under Section 564(b)(1) of the Act, 21 U.S.C. section 360bbb-3(b)(1), unless the authorization is terminated or revoked sooner.     Influenza A by PCR NEGATIVE NEGATIVE Final  Influenza B by PCR NEGATIVE NEGATIVE Final    Comment: (NOTE) The Xpert Xpress SARS-CoV-2/FLU/RSV assay is intended as an aid in  the diagnosis of influenza from Nasopharyngeal swab specimens and  should not be used as a sole basis for treatment. Nasal washings and  aspirates are unacceptable for Xpert Xpress SARS-CoV-2/FLU/RSV  testing.  Fact Sheet for Patients: https://www.moore.com/  Fact Sheet for Healthcare Providers: https://www.young.biz/  This test is not yet approved or cleared by the [place] FDA and  has been authorized for detection and/or diagnosis of SARS-CoV-2 by  FDA under an Emergency Use Authorization (EUA). This EUA will remain  in effect (meaning this test can be used) for the duration of the  Covid-19 declaration under Section 564(b)(1) of the Act, 21  U.S.C. section 360bbb-3(b)(1), unless the authorization is  terminated or revoked. Performed at [facility], [address], [place], [name] [address]       Radiology Studies: No results found.    Scheduled Meds: . albuterol  1-2 puff Inhalation BID  . atorvastatin  20 mg Oral Daily  . divalproex  500 mg Oral BID  . folic acid  1 mg Oral Daily  . furosemide  40 mg Intravenous Daily  . gabapentin  600 mg Oral TID  . hydrALAZINE  25 mg Oral Q8H  . isosorbide mononitrate  30 mg Oral Daily  . multivitamin with minerals  1 tablet Oral Daily  . nicotine  21 mg Transdermal Q24H  . potassium chloride  20 mEq Oral Daily  . umeclidinium bromide  1 puff Inhalation Daily   Continuous Infusions: . sodium chloride 10 mL/hr at 09/07/20 1825     LOS: 0 days      Time spent: 25 minutes   Noralee Stain, DO [facility] 09/09/2020, 11:56 AM   Available via Epic secure chat 7am-7pm After these hours, please refer to coverage provider listed on [email or web address]

## 2020-09-09 NOTE — Progress Notes (Signed)
Patient requesting for more food and drinks, continue educating  patient on heart failure and fluid restriction, patient stated he is not getting enough food, Dr. Alvino Chapel notified and okayed extra tray, patient still asking for snacks on the unit. Not receptive of the CHF management education, refused the CHF booklet. Asking if he can go out and smoke, reminded patient the hospital is a smoke free facility.

## 2020-09-10 LAB — GLUCOSE, CAPILLARY
Glucose-Capillary: 101 mg/dL — ABNORMAL HIGH (ref 70–99)
Glucose-Capillary: 104 mg/dL — ABNORMAL HIGH (ref 70–99)

## 2020-09-10 MED ORDER — OXYCODONE HCL 5 MG PO TABS
5.0000 mg | ORAL_TABLET | Freq: Once | ORAL | Status: AC
  Administered 2020-09-10: 5 mg via ORAL
  Filled 2020-09-10: qty 1

## 2020-09-10 MED ORDER — PROCHLORPERAZINE EDISYLATE 10 MG/2ML IJ SOLN
10.0000 mg | Freq: Four times a day (QID) | INTRAMUSCULAR | Status: DC | PRN
  Administered 2020-09-10: 10 mg via INTRAVENOUS
  Filled 2020-09-10: qty 2

## 2020-09-10 NOTE — Progress Notes (Addendum)
Pt refused to let NT take his temp. Pt also refused to let me assess him. I was only able to listen to his heart and lungs. Would not let me see or touch any other part of his body. Educated pt without success.

## 2020-09-10 NOTE — Progress Notes (Signed)
Rounded couple of times, refused blood sugar or vitals from the tech and I, then stopped responding to my question if there is any things he needs assistance with.

## 2020-09-10 NOTE — Progress Notes (Signed)
Pt. Refused care from NT and my preceptor occasionally allowed me to care for him. Pt. Is on fluid restriction refused to comply with such order becomes agitated and at times increase tone with speech. Pt. Does not comply with CHF protocol I continued to reinforced teaching with no success.

## 2020-09-10 NOTE — Progress Notes (Signed)
Pt. Refused blood sugar check, vitals, and meds.: Depakote, Hydralazine, and Multivitamin.

## 2020-09-10 NOTE — Progress Notes (Signed)
PROGRESS NOTE    [name]  CNO:[id number] DOB: 08-28-1966 DOA: 09/07/2020 PCP: Patient, No Pcp Per     Brief Narrative:  [name] is a 54 y.o. male with medical history significant of HFrEF, DM2, DM neuropathy. Presents with 2 week history of increasing dyspnea w/ worsening over the last 2 days. He reports non-productive cough. He reports worsening of dyspnea w/ bending over. He reports some associated BLE edema. He states that this issue has been present since the last time he visited the hospital. He says during that visit, he was taken off furosemide for an unknown reason. He also reports that he is unable to obtain his medications and that he primarily gets his care through the ER. He became concerned as he has found it more difficult to walk and called for EMS to bring him to the ED. He denies any other alleviating or aggravating factors. He denies any other treatments.   Patient was found to have elevated BNP and was started on IV Lasix.  New events last 24 hours / Subjective: SOB with laying flat. Has been asking for more food. Discussed fluid restriction diet. UOP 1.2L last 24 hours   Assessment & Plan:   Active Problems:   HFrEF (heart failure with reduced ejection fraction) (HCC)   Acute on chronic combined systolic and diastolic heart failure -BNP elevated 325.2 -Continue IV Lasix -Strict I's and O's, daily weights  Demand ischemia -Troponin 33, secondary to above  Essential hypertension -Continue Lasix, hydralazine  CAD -Continue Imdur  Type 2 diabetes, w complications including neuropathy -Ha1c 5.9 -SSI  -Continue gabapentin  Schizoaffective disorder -Valproic acid level <10, likely secondary to noncompliance -Continue Depakote  Hyperlipidemia -Continue Lipitor  Medical noncompliance -TOC consult for medication and PCP assistance   DVT prophylaxis: SCDs Code Status: Full Family Communication: No family at bedside Disposition Plan:  Status  is: Inpatient  Remains inpatient appropriate because:IV treatments appropriate due to intensity of illness or inability to take PO   Dispo: The patient is from: Home              Anticipated d/c is to: Home              Anticipated d/c date is: 1 day              Patient currently is not medically stable to d/c. Remains on IV lasix. Hopeful discharge 10/12 but remains at high risk for readmission as patient is medically noncompliant, homeless, and gets medical care from ED only.      Antimicrobials:  Anti-infectives (From admission, onward)   None       Objective: Vitals:   09/09/20 1254 09/09/20 1953 09/09/20 2058 09/10/20 0459  BP: 124/76  (!) 153/89 (!) 150/80  Pulse: 70  76 82  Resp: (!) 22  17 20   Temp: (!) 96.9 F (36.1 C)  98 F (36.7 C) (!) 97.5 F (36.4 C)  TempSrc: Axillary   Oral  SpO2: 98% 97% 99% 100%  Weight:      Height:        Intake/Output Summary (Last 24 hours) at 09/10/2020 0944 Last data filed at 09/10/2020 0500 Gross per 24 hour  Intake 840 ml  Output 950 ml  Net -110 ml   Filed Weights   09/07/20 0911 09/08/20 0451 09/09/20 0425  Weight: 84.4 kg 87.2 kg 87.2 kg    Examination: General exam: Appears calm and comfortable  Respiratory system: Diminished breath sounds, on room air  Cardiovascular system: S1 & S2 heard, RRR. No pedal edema. Gastrointestinal system: Abdomen is nondistended, soft and nontender. Normal bowel sounds heard. Central nervous system: Alert and oriented. Non focal exam. Speech clear  Extremities: Symmetric in appearance bilaterally  Skin: No rashes, lesions or ulcers on exposed skin  Psychiatry: Judgement and insight appear stable. Mood & affect appropriate.    Data Reviewed: I have personally reviewed following labs and imaging studies  CBC: Recent Labs  Lab 09/07/20 0953 09/09/20 1249  WBC 5.6 3.7*  NEUTROABS 3.8  --   HGB 14.4 15.4  HCT 43.8 46.2  MCV 101.2* 98.9  PLT 221 188   Basic Metabolic  Panel: Recent Labs  Lab 09/07/20 0953 09/09/20 1249  NA 140 137  K 3.8 3.8  CL 102 102  CO2 21* 23  GLUCOSE 69* 125*  BUN 12 16  CREATININE 1.15 1.17  CALCIUM 8.9 9.2   GFR: Estimated Creatinine Clearance: 80.8 mL/min (by C-G formula based on SCr of 1.17 mg/dL). Liver Function Tests: Recent Labs  Lab 09/07/20 0953  AST 30  ALT 23  ALKPHOS 68  BILITOT 1.6*  PROT 6.9  ALBUMIN 3.7   No results for input(s): LIPASE, AMYLASE in the last 168 hours. No results for input(s): AMMONIA in the last 168 hours. Coagulation Profile: No results for input(s): INR, PROTIME in the last 168 hours. Cardiac Enzymes: No results for input(s): CKTOTAL, CKMB, CKMBINDEX, TROPONINI in the last 168 hours. BNP (last 3 results) No results for input(s): PROBNP in the last 8760 hours. HbA1C: Recent Labs    09/09/20 1249  HGBA1C 5.9*   CBG: Recent Labs  Lab 09/09/20 0741 09/09/20 1307 09/09/20 1654 09/09/20 2050 09/10/20 0723  GLUCAP 101* 147* 89 106* 101*   Lipid Profile: No results for input(s): CHOL, HDL, LDLCALC, TRIG, CHOLHDL, LDLDIRECT in the last 72 hours. Thyroid Function Tests: No results for input(s): TSH, T4TOTAL, FREET4, T3FREE, THYROIDAB in the last 72 hours. Anemia Panel: No results for input(s): VITAMINB12, FOLATE, FERRITIN, TIBC, IRON, RETICCTPCT in the last 72 hours. Sepsis Labs: No results for input(s): PROCALCITON, LATICACIDVEN in the last 168 hours.  Recent Results (from the past 240 hour(s))  Respiratory Panel by RT PCR (Flu A&B, Covid) - Nasopharyngeal Swab     Status: None   Collection Time: 09/07/20  9:53 AM   Specimen: Nasopharyngeal Swab  Result Value Ref Range Status   SARS Coronavirus 2 by RT PCR NEGATIVE NEGATIVE Final    Comment: (NOTE) SARS-CoV-2 target nucleic acids are NOT DETECTED.  The SARS-CoV-2 RNA is generally detectable in upper respiratoy specimens during the acute phase of infection. The lowest concentration of SARS-CoV-2 viral copies this  assay can detect is 131 copies/mL. A negative result does not preclude SARS-Cov-2 infection and should not be used as the sole basis for treatment or other patient management decisions. A negative result may occur with  improper specimen collection/handling, submission of specimen other than nasopharyngeal swab, presence of viral mutation(s) within the areas targeted by this assay, and inadequate number of viral copies (<131 copies/mL). A negative result must be combined with clinical observations, patient history, and epidemiological information. The expected result is Negative.  Fact Sheet for Patients:  https://www.moore.com/  Fact Sheet for Healthcare Providers:  https://www.young.biz/  This test is no t yet approved or cleared by the [place] FDA and  has been authorized for detection and/or diagnosis of SARS-CoV-2 by FDA under an Emergency Use Authorization (EUA). This EUA will remain  in  effect (meaning this test can be used) for the duration of the COVID-19 declaration under Section 564(b)(1) of the Act, 21 U.S.C. section 360bbb-3(b)(1), unless the authorization is terminated or revoked sooner.     Influenza A by PCR NEGATIVE NEGATIVE Final   Influenza B by PCR NEGATIVE NEGATIVE Final    Comment: (NOTE) The Xpert Xpress SARS-CoV-2/FLU/RSV assay is intended as an aid in  the diagnosis of influenza from Nasopharyngeal swab specimens and  should not be used as a sole basis for treatment. Nasal washings and  aspirates are unacceptable for Xpert Xpress SARS-CoV-2/FLU/RSV  testing.  Fact Sheet for Patients: https://www.moore.com/  Fact Sheet for Healthcare Providers: https://www.young.biz/  This test is not yet approved or cleared by the [place] FDA and  has been authorized for detection and/or diagnosis of SARS-CoV-2 by  FDA under an Emergency Use Authorization (EUA). This EUA will  remain  in effect (meaning this test can be used) for the duration of the  Covid-19 declaration under Section 564(b)(1) of the Act, 21  U.S.C. section 360bbb-3(b)(1), unless the authorization is  terminated or revoked. Performed at [facility], [address], [place], [name] [address]       Radiology Studies: No results found.    Scheduled Meds: . albuterol  1-2 puff Inhalation BID  . atorvastatin  20 mg Oral Daily  . divalproex  500 mg Oral BID  . folic acid  1 mg Oral Daily  . furosemide  40 mg Intravenous Daily  . gabapentin  600 mg Oral TID  . hydrALAZINE  25 mg Oral Q8H  . insulin aspart  0-6 Units Subcutaneous TID WC  . isosorbide mononitrate  30 mg Oral Daily  . multivitamin with minerals  1 tablet Oral Daily  . nicotine  21 mg Transdermal Q24H  . potassium chloride  20 mEq Oral Daily  . umeclidinium bromide  1 puff Inhalation Daily   Continuous Infusions: . sodium chloride 10 mL/hr at 09/07/20 1825     LOS: 1 day      Time spent: 25 minutes   Noralee Stain, DO [facility] 09/10/2020, 9:44 AM   Available via Epic secure chat 7am-7pm After these hours, please refer to coverage provider listed on [email or web address]

## 2020-09-11 LAB — BASIC METABOLIC PANEL
Anion gap: 11 (ref 5–15)
BUN: 24 mg/dL — ABNORMAL HIGH (ref 6–20)
CO2: 24 mmol/L (ref 22–32)
Calcium: 9.1 mg/dL (ref 8.9–10.3)
Chloride: 102 mmol/L (ref 98–111)
Creatinine, Ser: 1.28 mg/dL — ABNORMAL HIGH (ref 0.61–1.24)
GFR, Estimated: >60 mL/min (ref >60)
Glucose, Bld: 128 mg/dL — ABNORMAL HIGH (ref 70–99)
Potassium: 3.9 mmol/L (ref 3.5–5.1)
Sodium: 137 mmol/L (ref 135–145)

## 2020-09-11 LAB — GLUCOSE, CAPILLARY: Glucose-Capillary: 91 mg/dL (ref 70–99)

## 2020-09-11 MED ORDER — GABAPENTIN 600 MG PO TABS: 600.0000 mg | ORAL_TABLET | Freq: Three times a day (TID) | ORAL | 2 refills | Status: DC

## 2020-09-11 MED ORDER — ATORVASTATIN CALCIUM 20 MG PO TABS: 20.0000 mg | ORAL_TABLET | Freq: Every day | ORAL | 0 refills | Status: DC

## 2020-09-11 MED ORDER — ISOSORBIDE MONONITRATE ER 30 MG PO TB24
30.0000 mg | ORAL_TABLET | Freq: Every day | ORAL | 2 refills | Status: DC
Start: 2020-09-11 — End: 2020-09-26

## 2020-09-11 MED ORDER — ATORVASTATIN CALCIUM 20 MG PO TABS
20.0000 mg | ORAL_TABLET | Freq: Every day | ORAL | 2 refills | Status: DC
Start: 2020-09-11 — End: 2020-09-26

## 2020-09-11 MED ORDER — GABAPENTIN 600 MG PO TABS: 600.0000 mg | ORAL_TABLET | Freq: Three times a day (TID) | ORAL | 0 refills | Status: DC

## 2020-09-11 MED ORDER — HYDRALAZINE HCL 25 MG PO TABS: 25.0000 mg | ORAL_TABLET | Freq: Three times a day (TID) | ORAL | 0 refills | Status: DC

## 2020-09-11 MED ORDER — HYDRALAZINE HCL 25 MG PO TABS: 25.0000 mg | ORAL_TABLET | Freq: Three times a day (TID) | ORAL | 2 refills | Status: DC

## 2020-09-11 MED ORDER — FUROSEMIDE 40 MG PO TABS: 40.0000 mg | ORAL_TABLET | Freq: Every day | ORAL | 0 refills | Status: DC

## 2020-09-11 MED ORDER — ISOSORBIDE MONONITRATE ER 30 MG PO TB24: 30.0000 mg | ORAL_TABLET | Freq: Every day | ORAL | 0 refills | Status: DC

## 2020-09-11 MED ORDER — FUROSEMIDE 40 MG PO TABS: 40.0000 mg | ORAL_TABLET | Freq: Every day | ORAL | 2 refills | Status: DC

## 2020-09-11 MED FILL — FUROSEMIDE 40 MG TAB: 40 | 30 days supply | Qty: 30 | Fill #0

## 2020-09-11 MED FILL — GABAPENTIN 600 MG TABLET: 600 | 30 days supply | Qty: 90 | Fill #0

## 2020-09-11 MED FILL — ISOSORBIDE MN ER 30 MG TAB: 30 | 30 days supply | Qty: 30 | Fill #0

## 2020-09-11 MED FILL — ATORVASTATIN CALCIUM 20 MG: 20 | 30 days supply | Qty: 30 | Fill #0

## 2020-09-11 MED FILL — hydrALAZINE HCL 25 MG TABS: 25 | 30 days supply | Qty: 90 | Fill #0

## 2020-09-11 NOTE — Progress Notes (Signed)
Pt very uncooperative, agitated, and restless. Pt refuses a number of treatments including certain medications and full assessment. Will continue to monitor pt and treat as allowed.

## 2020-09-11 NOTE — Discharge Summary (Signed)
Physician Discharge Summary  [name] WUJ:[id number] DOB: 04-23-1966 DOA: 09/07/2020  PCP: Patient, No Pcp Per  Admit date: 09/07/2020 Discharge date: 09/11/2020  Admitted From: Homeless Disposition:  Same  Recommendations for Outpatient Follow-up:  1. Follow up with PCP in 1 week  Discharge Condition: Stable CODE STATUS: Full  Diet recommendation: Heart healthy, fluid restriction   Brief/Interim Summary: [name] a 54 y.o.malewith medical history significant ofHFrEF, DM2, DM neuropathy. Presents with 2 week history of increasing dyspnea w/ worsening over the last 2 days. He reports non-productive cough. He reports worsening of dyspnea w/ bending over. He reports some associated BLE edema. He states that this issue has been present since the last time he visited the hospital. He says during that visit, he was taken off furosemide for an unknown reason. He also reports that he is unable to obtain his medications and that he primarily gets his care through the ER. He became concerned as he has found it more difficult to walk and called for EMS to bring him to the ED. He denies any other alleviating or aggravating factors. He denies any other treatments.  Patient was found to have elevated BNP and was started on IV Lasix. He continued to improve. He remained noncompliant with diet and fluid restriction and often refused meds and lab work. High risk for re-admission.   Discharge Diagnoses:  Active Problems:   HFrEF (heart failure with reduced ejection fraction) (HCC)  Acute on chronic combined systolic and diastolic heart failure -BNP elevated 325.2 -IV Lasix --> PO  -Strict I's and O's, daily weights  Demand ischemia -Troponin 33, secondary to above  Essential hypertension -Continue Lasix, hydralazine  CAD -Continue Imdur  Type 2 diabetes, w complications including neuropathy -Ha1c 5.9 -Continue gabapentin  Schizoaffective disorder -Valproic acid level  <10, likely secondary to noncompliance -Has been refusing depokote   Hyperlipidemia -Continue Lipitor  Medical noncompliance    Discharge Instructions  Discharge Instructions    (HEART FAILURE PATIENTS) Call MD:  Anytime you have any of the following symptoms: 1) 3 pound weight gain in 24 hours or 5 pounds in 1 week 2) shortness of breath, with or without a dry hacking cough 3) swelling in the hands, feet or stomach 4) if you have to sleep on extra pillows at night in order to breathe.   Complete by: As directed    Call MD for:  difficulty breathing, headache or visual disturbances   Complete by: As directed    Call MD for:  extreme fatigue   Complete by: As directed    Call MD for:  persistant dizziness or light-headedness   Complete by: As directed    Call MD for:  persistant nausea and vomiting   Complete by: As directed    Call MD for:  severe uncontrolled pain   Complete by: As directed    Call MD for:  temperature >100.4   Complete by: As directed    Diet - low sodium heart healthy   Complete by: As directed    Discharge instructions   Complete by: As directed    You were cared for by a hospitalist during your hospital stay. If you have any questions about your discharge medications or the care you received while you were in the hospital after you are discharged, you can call the unit and ask to speak with the hospitalist on call if the hospitalist that took care of you is not available. Once you are discharged, your primary care  physician will handle any further medical issues. Please note that NO REFILLS for any discharge medications will be authorized once you are discharged, as it is imperative that you return to your primary care physician (or establish a relationship with a primary care physician if you do not have one) for your aftercare needs so that they can reassess your need for medications and monitor your lab values.   Increase activity slowly   Complete by: As  directed      Allergies as of 09/11/2020      Reactions   Ibuprofen Anaphylaxis   Acetaminophen Hives, Nausea And Vomiting, Rash   Pt overdosed on bottle of APAP 05/06/16      Medication List    STOP taking these medications   albuterol 108 (90 Base) MCG/ACT inhaler Commonly known as: VENTOLIN HFA   ARIPiprazole 2 MG tablet Commonly known as: ABILIFY   divalproex 500 MG 24 hr tablet Commonly known as: Depakote ER   torsemide 20 MG tablet Commonly known as: Demadex   umeclidinium bromide 62.5 MCG/INH Aepb Commonly known as: INCRUSE ELLIPTA     TAKE these medications   atorvastatin 20 MG tablet Commonly known as: LIPITOR Take 1 tablet (20 mg total) by mouth daily.   furosemide 40 MG tablet Commonly known as: Lasix Take 1 tablet (40 mg total) by mouth daily.   gabapentin 600 MG tablet Commonly known as: Neurontin Take 1 tablet (600 mg total) by mouth 3 (three) times daily.   hydrALAZINE 25 MG tablet Commonly known as: APRESOLINE Take 1 tablet (25 mg total) by mouth every 8 (eight) hours.   isosorbide mononitrate 30 MG 24 hr tablet Commonly known as: IMDUR Take 1 tablet (30 mg total) by mouth daily.       Follow-up Information    [facility]. Schedule an appointment as soon as possible for a visit in 1 week(s).   Contact information: [address] [place] [address] [phone]             Allergies  Allergen Reactions  . Ibuprofen Anaphylaxis  . Acetaminophen Hives, Nausea And Vomiting and Rash    Pt overdosed on bottle of APAP 05/06/16     Consultations:  None   Procedures/Studies: DG Chest Port 1 View  Result Date: 09/07/2020 CLINICAL DATA:  Cough and shortness of breath EXAM: PORTABLE CHEST 1 VIEW COMPARISON:  August 21, 2020 FINDINGS: Lungs are clear. Heart is mildly enlarged with pulmonary vascularity normal. No adenopathy. No bone lesions. IMPRESSION: Mild cardiac enlargement,  stable.  Lungs clear. Electronically Signed   By: Bretta Bang III M.D.   On: 09/07/2020 09:38   DG Chest Port 1 View  Result Date: 08/21/2020 CLINICAL DATA:  Shortness of breath and cough for several weeks EXAM: PORTABLE CHEST 1 VIEW COMPARISON:  07/21/2020 FINDINGS: Cardiac shadow remains mildly enlarged accentuated by the portable technique. The lungs are well aerated bilaterally. Minimal interstitial opacity is noted in the bases bilaterally slightly increased when compared with the prior exam. No focal confluent infiltrate is seen. No bony abnormality is noted. IMPRESSION: Mild interstitial opacities in the bases bilaterally. Electronically Signed   By: Alcide Clever M.D.   On: 08/21/2020 21:59      Discharge Exam: Vitals:   09/11/20 0549 09/11/20 0906  BP: (!) 168/97   Pulse: 79   Resp: 20   Temp: 97.7 F (36.5 C)   SpO2: 100% 98%    General: Pt is  alert, awake, not in acute distress Cardiovascular: RRR, S1/S2 +, no edema Respiratory: CTA bilaterally, no wheezing, no rhonchi, no respiratory distress, no conversational dyspnea  Abdominal: Soft, NT, ND, bowel sounds + Extremities: no edema, no cyanosis Psych: Normal mood     The results of significant diagnostics from this hospitalization (including imaging, microbiology, ancillary and laboratory) are listed below for reference.     Microbiology: Recent Results (from the past 240 hour(s))  Respiratory Panel by RT PCR (Flu A&B, Covid) - Nasopharyngeal Swab     Status: None   Collection Time: 09/07/20  9:53 AM   Specimen: Nasopharyngeal Swab  Result Value Ref Range Status   SARS Coronavirus 2 by RT PCR NEGATIVE NEGATIVE Final    Comment: (NOTE) SARS-CoV-2 target nucleic acids are NOT DETECTED.  The SARS-CoV-2 RNA is generally detectable in upper respiratoy specimens during the acute phase of infection. The lowest concentration of SARS-CoV-2 viral copies this assay can detect is 131 copies/mL. A negative result does  not preclude SARS-Cov-2 infection and should not be used as the sole basis for treatment or other patient management decisions. A negative result may occur with  improper specimen collection/handling, submission of specimen other than nasopharyngeal swab, presence of viral mutation(s) within the areas targeted by this assay, and inadequate number of viral copies (<131 copies/mL). A negative result must be combined with clinical observations, patient history, and epidemiological information. The expected result is Negative.  Fact Sheet for Patients:  https://www.moore.com/  Fact Sheet for Healthcare Providers:  https://www.young.biz/  This test is no t yet approved or cleared by the [place] FDA and  has been authorized for detection and/or diagnosis of SARS-CoV-2 by FDA under an Emergency Use Authorization (EUA). This EUA will remain  in effect (meaning this test can be used) for the duration of the COVID-19 declaration under Section 564(b)(1) of the Act, 21 U.S.C. section 360bbb-3(b)(1), unless the authorization is terminated or revoked sooner.     Influenza A by PCR NEGATIVE NEGATIVE Final   Influenza B by PCR NEGATIVE NEGATIVE Final    Comment: (NOTE) The Xpert Xpress SARS-CoV-2/FLU/RSV assay is intended as an aid in  the diagnosis of influenza from Nasopharyngeal swab specimens and  should not be used as a sole basis for treatment. Nasal washings and  aspirates are unacceptable for Xpert Xpress SARS-CoV-2/FLU/RSV  testing.  Fact Sheet for Patients: https://www.moore.com/  Fact Sheet for Healthcare Providers: https://www.young.biz/  This test is not yet approved or cleared by the [place] FDA and  has been authorized for detection and/or diagnosis of SARS-CoV-2 by  FDA under an Emergency Use Authorization (EUA). This EUA will remain  in effect (meaning this test can be used) for the  duration of the  Covid-19 declaration under Section 564(b)(1) of the Act, 21  U.S.C. section 360bbb-3(b)(1), unless the authorization is  terminated or revoked. Performed at [facility], [address] [place], [name] [address]      Labs: BNP (last 3 results) Recent Labs    07/21/20 1347 08/21/20 2211 09/07/20 0953  BNP 46.3 325.2* 390.9*   Basic Metabolic Panel: Recent Labs  Lab 09/07/20 0953 09/09/20 1249 09/11/20 0436  NA 140 137 137  K 3.8 3.8 3.9  CL 102 102 102  CO2 21* 23 24  GLUCOSE 69* 125* 128*  BUN 12 16 24*  CREATININE 1.15 1.17 1.28*  CALCIUM 8.9 9.2 9.1   Liver Function Tests: Recent Labs  Lab 09/07/20 0953  AST 30  ALT 23  ALKPHOS 68  BILITOT 1.6*  PROT 6.9  ALBUMIN 3.7   No results for input(s): LIPASE, AMYLASE in the last 168 hours. No results for input(s): AMMONIA in the last 168 hours. CBC: Recent Labs  Lab 09/07/20 0953 09/09/20 1249  WBC 5.6 3.7*  NEUTROABS 3.8  --   HGB 14.4 15.4  HCT 43.8 46.2  MCV 101.2* 98.9  PLT 221 188   Cardiac Enzymes: No results for input(s): CKTOTAL, CKMB, CKMBINDEX, TROPONINI in the last 168 hours. BNP: Invalid input(s): POCBNP CBG: Recent Labs  Lab 09/09/20 1654 09/09/20 2050 09/10/20 0723 09/10/20 2108 09/11/20 0727  GLUCAP 89 106* 101* 104* 91   D-Dimer No results for input(s): DDIMER in the last 72 hours. Hgb A1c Recent Labs    09/09/20 1249  HGBA1C 5.9*   Lipid Profile No results for input(s): CHOL, HDL, LDLCALC, TRIG, CHOLHDL, LDLDIRECT in the last 72 hours. Thyroid function studies No results for input(s): TSH, T4TOTAL, T3FREE, THYROIDAB in the last 72 hours.  Invalid input(s): FREET3 Anemia work up No results for input(s): VITAMINB12, FOLATE, FERRITIN, TIBC, IRON, RETICCTPCT in the last 72 hours. Urinalysis    Component Value Date/Time   COLORURINE YELLOW 08/22/2020 1127   APPEARANCEUR CLEAR 08/22/2020 1127   LABSPEC 1.010 08/22/2020 1127    PHURINE 5.0 08/22/2020 1127   GLUCOSEU NEGATIVE 08/22/2020 1127   HGBUR NEGATIVE 08/22/2020 1127   BILIRUBINUR NEGATIVE 08/22/2020 1127   KETONESUR NEGATIVE 08/22/2020 1127   PROTEINUR NEGATIVE 08/22/2020 1127   NITRITE NEGATIVE 08/22/2020 1127   LEUKOCYTESUR NEGATIVE 08/22/2020 1127   Sepsis Labs Invalid input(s): PROCALCITONIN,  WBC,  LACTICIDVEN Microbiology Recent Results (from the past 240 hour(s))  Respiratory Panel by RT PCR (Flu A&B, Covid) - Nasopharyngeal Swab     Status: None   Collection Time: 09/07/20  9:53 AM   Specimen: Nasopharyngeal Swab  Result Value Ref Range Status   SARS Coronavirus 2 by RT PCR NEGATIVE NEGATIVE Final    Comment: (NOTE) SARS-CoV-2 target nucleic acids are NOT DETECTED.  The SARS-CoV-2 RNA is generally detectable in upper respiratoy specimens during the acute phase of infection. The lowest concentration of SARS-CoV-2 viral copies this assay can detect is 131 copies/mL. A negative result does not preclude SARS-Cov-2 infection and should not be used as the sole basis for treatment or other patient management decisions. A negative result may occur with  improper specimen collection/handling, submission of specimen other than nasopharyngeal swab, presence of viral mutation(s) within the areas targeted by this assay, and inadequate number of viral copies (<131 copies/mL). A negative result must be combined with clinical observations, patient history, and epidemiological information. The expected result is Negative.  Fact Sheet for Patients:  https://www.moore.com/  Fact Sheet for Healthcare Providers:  https://www.young.biz/  This test is no t yet approved or cleared by the [place] FDA and  has been authorized for detection and/or diagnosis of SARS-CoV-2 by FDA under an Emergency Use Authorization (EUA). This EUA will remain  in effect (meaning this test can be used) for the duration of  the COVID-19 declaration under Section 564(b)(1) of the Act, 21 U.S.C. section 360bbb-3(b)(1), unless the authorization is terminated or revoked sooner.     Influenza A by PCR NEGATIVE NEGATIVE Final   Influenza B by PCR NEGATIVE NEGATIVE Final    Comment: (NOTE) The Xpert Xpress SARS-CoV-2/FLU/RSV assay is intended as an aid in  the diagnosis of influenza from Nasopharyngeal swab specimens and  should not be used as  a sole basis for treatment. Nasal washings and  aspirates are unacceptable for Xpert Xpress SARS-CoV-2/FLU/RSV  testing.  Fact Sheet for Patients: https://www.moore.com/  Fact Sheet for Healthcare Providers: https://www.young.biz/  This test is not yet approved or cleared by the [place] FDA and  has been authorized for detection and/or diagnosis of SARS-CoV-2 by  FDA under an Emergency Use Authorization (EUA). This EUA will remain  in effect (meaning this test can be used) for the duration of the  Covid-19 declaration under Section 564(b)(1) of the Act, 21  U.S.C. section 360bbb-3(b)(1), unless the authorization is  terminated or revoked. Performed at [facility], [address], [place], [name] [address]      Patient was seen and examined on the day of discharge and was found to be in stable condition. Time coordinating discharge: 25 minutes including assessment and coordination of care, as well as examination of the patient.   SIGNED:  Noralee Stain, DO [facility] 09/11/2020, 9:28 AM

## 2020-09-11 NOTE — TOC Transition Note (Signed)
Transition of Care [facility]) - CM/SW Discharge Note   Patient Details  Name: [name] MRN: [id number] Date of Birth: [birth year]  Transition of Care [facility]) CM/SW Contact:  Lanier Clam, RN Phone Number: 09/11/2020, 10:21 AM   Clinical Narrative: CM referral for homelessness, no pcp. Spoke to patient in rm about d/c plans-he has to leave now to meet his boss on [address] to cut grass. Patient has used the [facility] program, & 1x free fill for meds in August 2021-not eligible for either service. Offered resurces for [facility] pcp,& to establish care,IRC-patient declines. He only wants a bus pass-provided bus pass in shadow chart for Nsg to give once d/c papers given. No further CM needs.      Final next level of care: Homeless Shelter Barriers to Discharge: No Barriers Identified   Patient Goals and CMS Choice Patient states their goals for this hospitalization and ongoing recovery are:: Don't worry about where I'm going. I have to cut grass. CMS Medicare.gov Compare Post Acute Care list provided to:: Patient Choice offered to / list presented to : Patient  Discharge Placement                       Discharge Plan and Services   Discharge Planning Services: CM Consult Post Acute Care Choice: NA (Patient states he will be ok.)                               Social Determinants of Health (SDOH) Interventions     Readmission Risk Interventions Readmission Risk Prevention Plan 07/23/2020 04/06/2019 03/29/2019  Transportation Screening Complete Complete Complete  PCP or Specialist Appt within 3-5 Days Complete Complete Patient refused  HRI or Home Care Consult Complete Complete -  Social Work Consult for Recovery Care Planning/Counseling Complete Complete Complete  Palliative Care Screening Not Applicable Not Applicable Not Applicable  Medication Review Oceanographer) Complete Complete Complete  Some recent data might be hidden

## 2020-09-11 NOTE — Progress Notes (Signed)
Pt discharged to home at this time. Prior to discharge, IV and tele were removed. Paperwork including medications were reviewed. Pt verbalized understanding and stated no concerns at this time. Pt was escorted to lobby and escorted to bus stop by Engineer, materials at desk. Pt stable at time of discharge in no distress.

## 2020-09-17 ENCOUNTER — Telehealth: Payer: Self-pay | Admitting: *Deleted

## 2020-09-17 NOTE — Telephone Encounter (Signed)
Left message for patient to call and schedule 6 month follow up with Dr. Schumann 

## 2020-09-25 ENCOUNTER — Emergency Department (HOSPITAL_COMMUNITY): Payer: Medicaid - Out of State

## 2020-09-25 ENCOUNTER — Encounter (HOSPITAL_COMMUNITY): Payer: Self-pay | Admitting: *Deleted

## 2020-09-25 ENCOUNTER — Observation Stay (HOSPITAL_COMMUNITY)
Admission: EM | Admit: 2020-09-25 | Discharge: 2020-09-26 | Disposition: A | Discharge status: Home / Self Care | Payer: Self-pay | Attending: Internal Medicine | Admitting: Internal Medicine

## 2020-09-25 DIAGNOSIS — I5023 Acute on chronic systolic (congestive) heart failure: Principal | ICD-10-CM | POA: Insufficient documentation

## 2020-09-25 DIAGNOSIS — F1420 Cocaine dependence, uncomplicated: Secondary | ICD-10-CM | POA: Diagnosis present

## 2020-09-25 DIAGNOSIS — F1910 Other psychoactive substance abuse, uncomplicated: Secondary | ICD-10-CM | POA: Insufficient documentation

## 2020-09-25 DIAGNOSIS — I10 Essential (primary) hypertension: Secondary | ICD-10-CM | POA: Diagnosis present

## 2020-09-25 DIAGNOSIS — R079 Chest pain, unspecified: Secondary | ICD-10-CM | POA: Diagnosis present

## 2020-09-25 DIAGNOSIS — J441 Chronic obstructive pulmonary disease with (acute) exacerbation: Secondary | ICD-10-CM | POA: Insufficient documentation

## 2020-09-25 DIAGNOSIS — I110 Hypertensive heart disease with heart failure: Secondary | ICD-10-CM | POA: Insufficient documentation

## 2020-09-25 DIAGNOSIS — R0602 Shortness of breath: Secondary | ICD-10-CM | POA: Diagnosis present

## 2020-09-25 DIAGNOSIS — Z20822 Contact with and (suspected) exposure to covid-19: Secondary | ICD-10-CM | POA: Insufficient documentation

## 2020-09-25 DIAGNOSIS — F250 Schizoaffective disorder, bipolar type: Secondary | ICD-10-CM | POA: Diagnosis present

## 2020-09-25 DIAGNOSIS — F17200 Nicotine dependence, unspecified, uncomplicated: Secondary | ICD-10-CM | POA: Diagnosis present

## 2020-09-25 DIAGNOSIS — F17210 Nicotine dependence, cigarettes, uncomplicated: Secondary | ICD-10-CM | POA: Insufficient documentation

## 2020-09-25 DIAGNOSIS — Z79899 Other long term (current) drug therapy: Secondary | ICD-10-CM | POA: Insufficient documentation

## 2020-09-25 DIAGNOSIS — F1010 Alcohol abuse, uncomplicated: Secondary | ICD-10-CM | POA: Diagnosis present

## 2020-09-25 DIAGNOSIS — J45909 Unspecified asthma, uncomplicated: Secondary | ICD-10-CM | POA: Insufficient documentation

## 2020-09-25 LAB — CBC WITH DIFFERENTIAL/PLATELET
Abs Immature Granulocytes: 0.01 10*3/uL (ref 0.00–0.07)
Basophils Absolute: 0 10*3/uL (ref 0.0–0.1)
Basophils Relative: 1 %
Eosinophils Absolute: 0 10*3/uL (ref 0.0–0.5)
Eosinophils Relative: 0 %
HCT: 45.2 % (ref 39.0–52.0)
Hemoglobin: 15.1 g/dL (ref 13.0–17.0)
Immature Granulocytes: 0 %
Lymphocytes Relative: 31 %
Lymphs Abs: 1.9 10*3/uL (ref 0.7–4.0)
MCH: 33 pg (ref 26.0–34.0)
MCHC: 33.4 g/dL (ref 30.0–36.0)
MCV: 98.9 fL (ref 80.0–100.0)
Monocytes Absolute: 0.5 10*3/uL (ref 0.1–1.0)
Monocytes Relative: 8 %
Neutro Abs: 3.6 10*3/uL (ref 1.7–7.7)
Neutrophils Relative %: 60 %
Platelets: 285 10*3/uL (ref 150–400)
RBC: 4.57 MIL/uL (ref 4.22–5.81)
RDW: 15.4 % (ref 11.5–15.5)
WBC: 5.9 10*3/uL (ref 4.0–10.5)
nRBC: 0 % (ref 0.0–0.2)

## 2020-09-25 LAB — BASIC METABOLIC PANEL
Anion gap: 13 (ref 5–15)
BUN: 17 mg/dL (ref 6–20)
CO2: 23 mmol/L (ref 22–32)
Calcium: 9 mg/dL (ref 8.9–10.3)
Chloride: 105 mmol/L (ref 98–111)
Creatinine, Ser: 1.37 mg/dL — ABNORMAL HIGH (ref 0.61–1.24)
GFR, Estimated: >60 mL/min (ref >60)
Glucose, Bld: 75 mg/dL (ref 70–99)
Potassium: 3.6 mmol/L (ref 3.5–5.1)
Sodium: 141 mmol/L (ref 135–145)

## 2020-09-25 LAB — RAPID URINE DRUG SCREEN, HOSP PERFORMED
Amphetamines: NOT DETECTED
Barbiturates: NOT DETECTED
Benzodiazepines: NOT DETECTED
Cocaine: POSITIVE — AB
Opiates: NOT DETECTED
Tetrahydrocannabinol: NOT DETECTED

## 2020-09-25 LAB — TROPONIN I (HIGH SENSITIVITY)
Troponin I (High Sensitivity): 31 ng/L — ABNORMAL HIGH (ref <18)
Troponin I (High Sensitivity): 28 ng/L — ABNORMAL HIGH (ref <18)

## 2020-09-25 LAB — BRAIN NATRIURETIC PEPTIDE: B Natriuretic Peptide: 922.9 pg/mL — ABNORMAL HIGH (ref 0.0–100.0)

## 2020-09-25 LAB — RESPIRATORY PANEL BY RT PCR (FLU A&B, COVID)
Influenza A by PCR: NEGATIVE
Influenza B by PCR: NEGATIVE
SARS Coronavirus 2 by RT PCR: NEGATIVE

## 2020-09-25 LAB — CK: Total CK: 387 U/L (ref 49–397)

## 2020-09-25 LAB — LACTIC ACID, PLASMA: Lactic Acid, Venous: 1.6 mmol/L (ref 0.5–1.9)

## 2020-09-25 IMAGING — DX DG CHEST 1V PORT
2 series · 2 of 2 positions shown · non-contrast
Comparison: [DATE]

CLINICAL DATA: Shortness of breath.

EXAM:
PORTABLE CHEST 1 VIEW

[chest ap (1 of 2)]
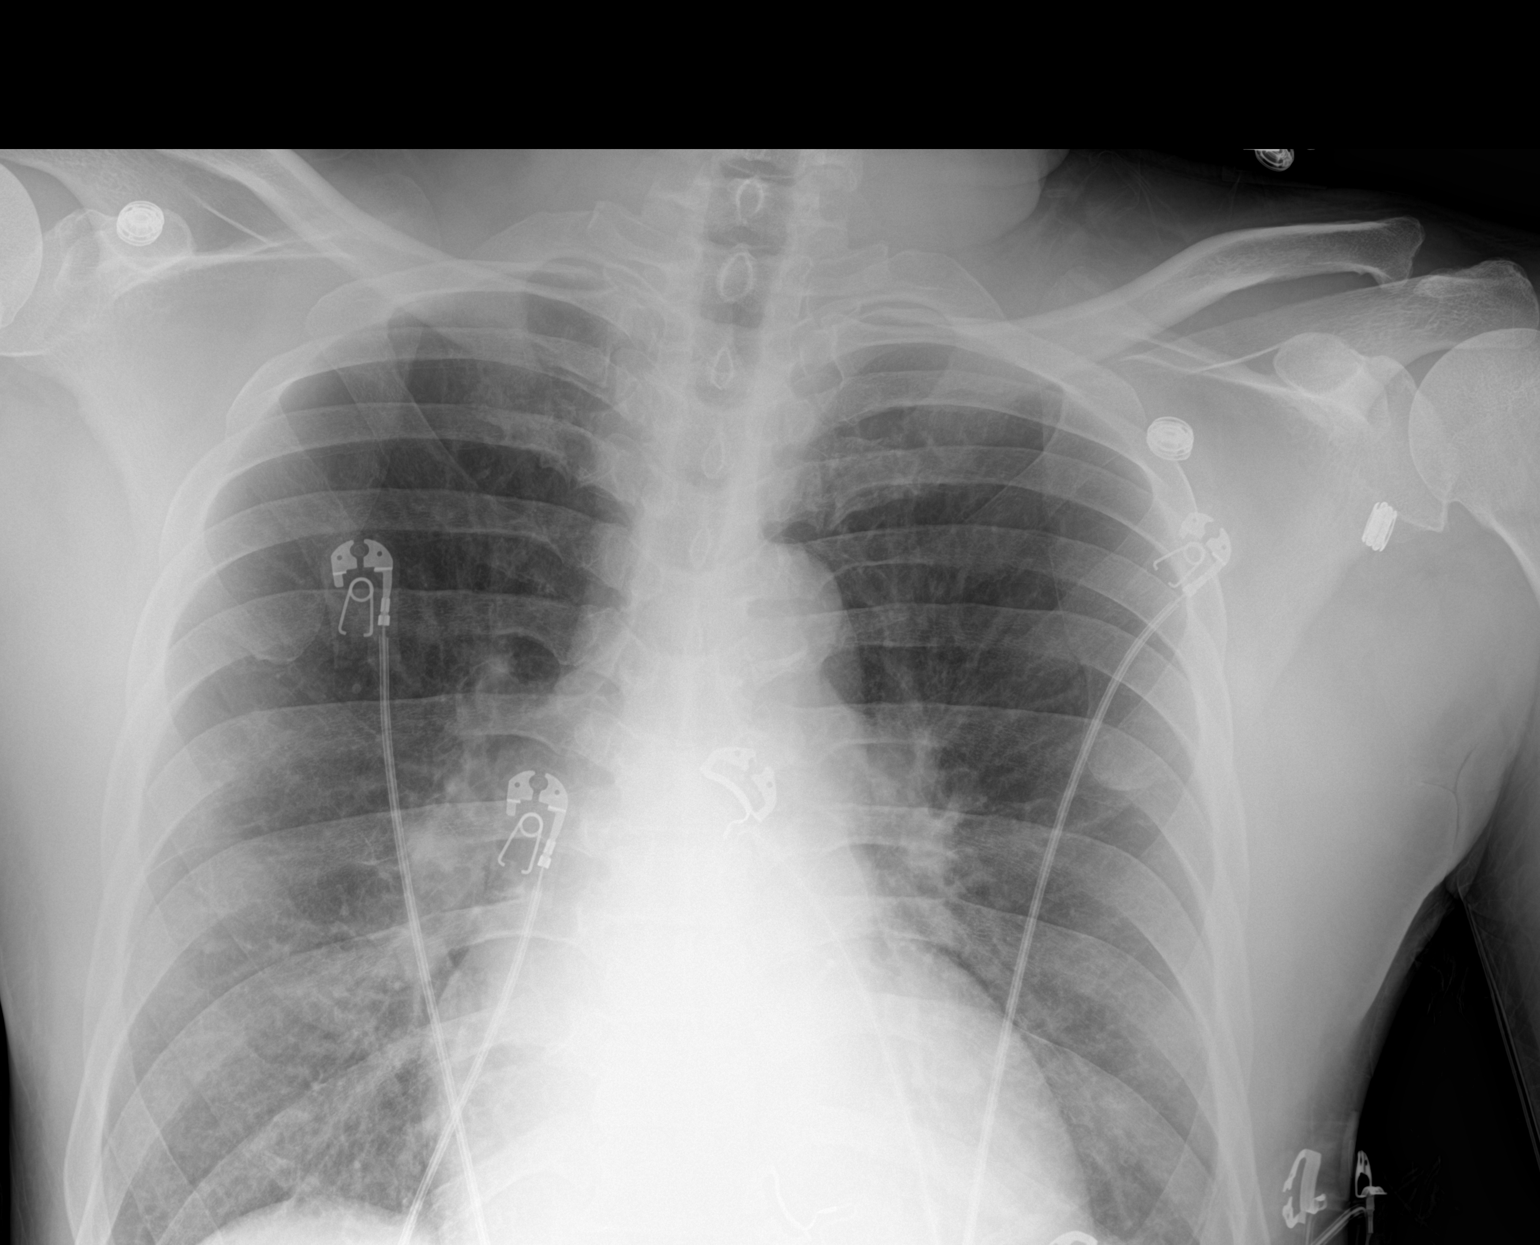

[chest ap (2 of 2)]
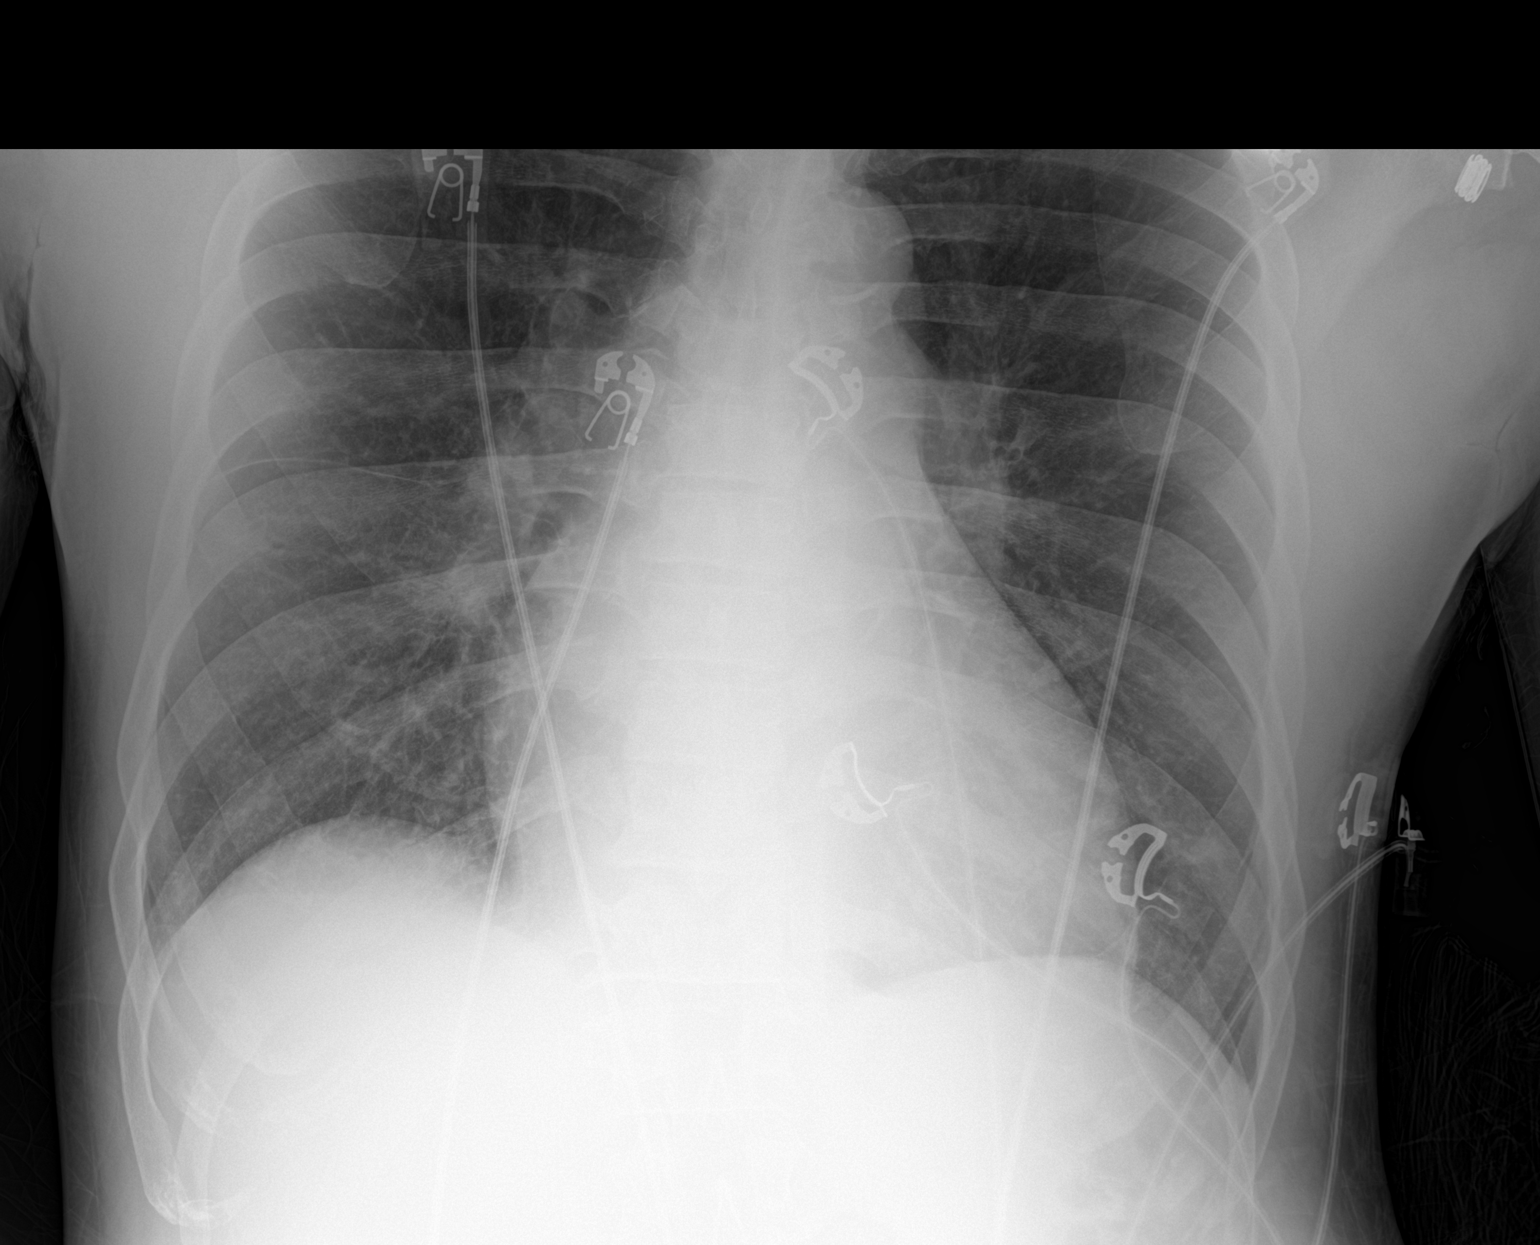

[2 of 2 positions shown; findings below may reference images not displayed]

FINDINGS: The lungs are clear without focal pneumonia, edema, pneumothorax or
pleural effusion. Interstitial markings are diffusely coarsened with
chronic features. Cardiopericardial silhouette is at upper limits of
normal for size. The visualized bony structures of the thorax show
no acute abnormality. Telemetry leads overlie the chest.
IMPRESSION: Borderline cardiomegaly.  No acute cardiopulmonary findings.

## 2020-09-25 IMAGING — DX DG TOE GREAT 2+V*R*
3 series · 3 of 3 positions shown · non-contrast
Comparison: No comparison studies available.

CLINICAL DATA: Wound on the right great toe.

EXAM:
RIGHT GREAT TOE

[toe ap]
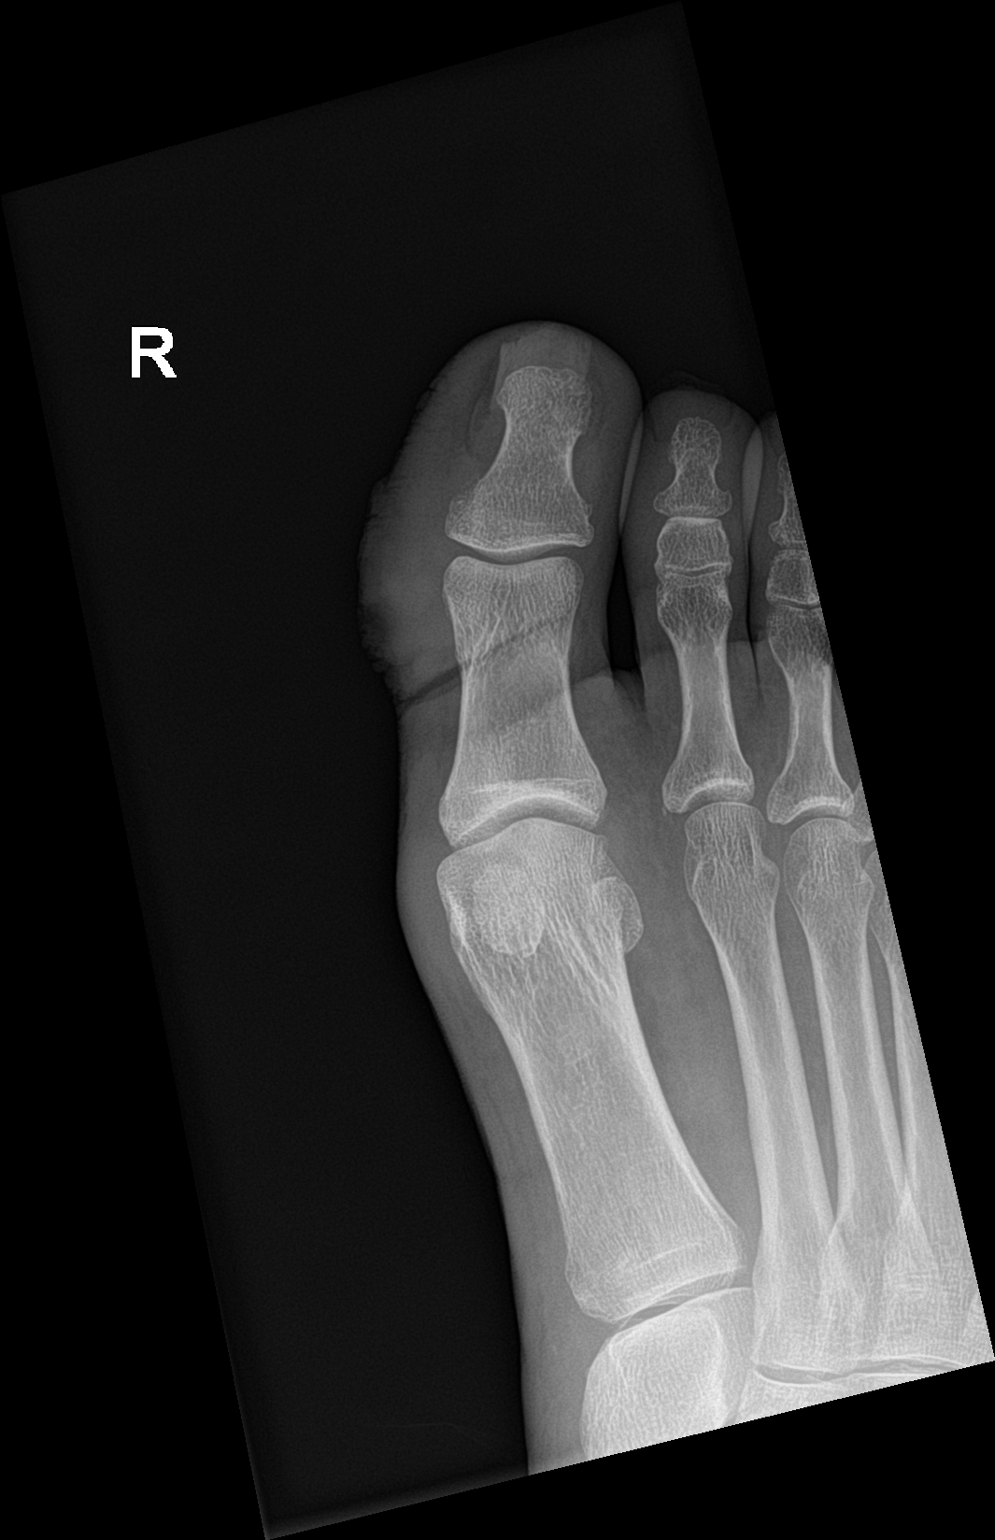

[toe obl]
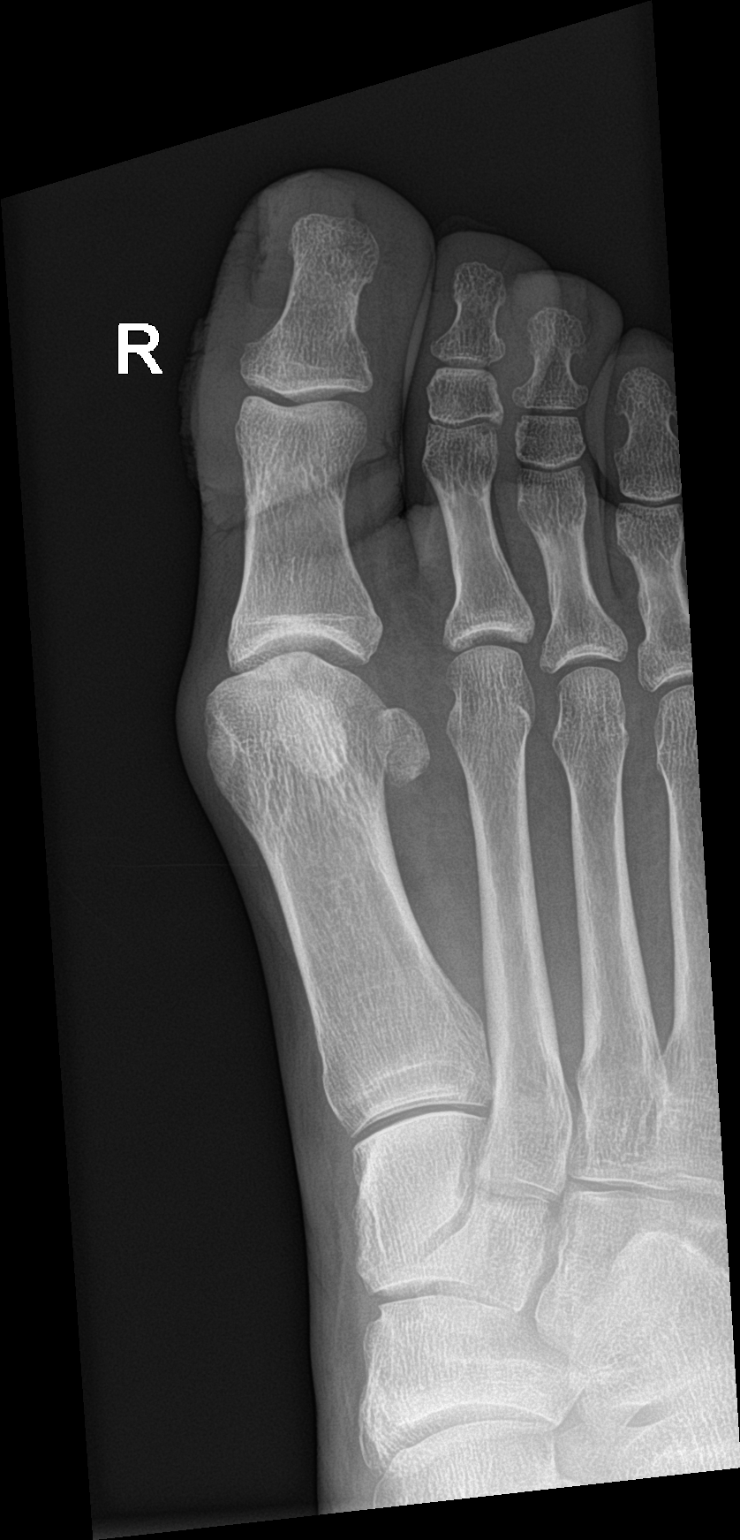

[toe lat]
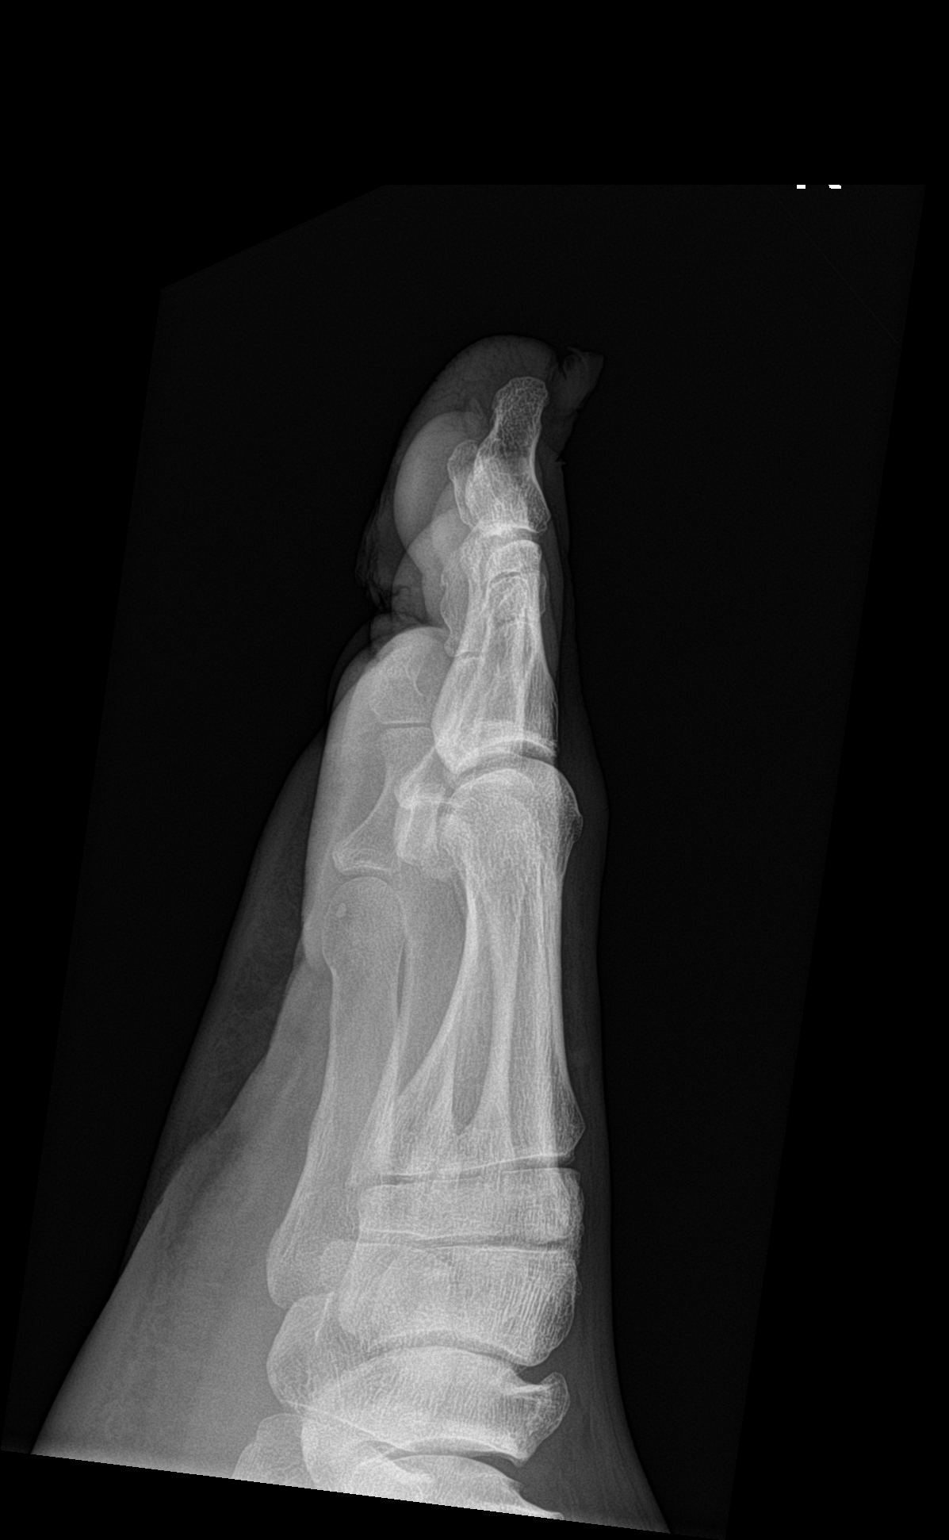

[3 of 3 positions shown; findings below may reference images not displayed]

FINDINGS: No fracture. No subluxation or dislocation. No evidence for lucent
lesion in the great toe. No perceptible cortical loss in the anatomy
of the great toe.
IMPRESSION: No radiographic features of osteomyelitis in the right great toe.

## 2020-09-25 MED ORDER — SODIUM CHLORIDE 0.9% FLUSH
3.0000 mL | INTRAVENOUS | Status: DC | PRN
  Administered 2020-09-25: 3 mL via INTRAVENOUS

## 2020-09-25 MED ORDER — SODIUM CHLORIDE 0.9 % IV SOLN: 250.0000 mL | INTRAVENOUS | Status: DC | PRN

## 2020-09-25 MED ORDER — ISOSORBIDE MONONITRATE ER 30 MG PO TB24
15.0000 mg | ORAL_TABLET | Freq: Every day | ORAL | Status: DC
  Administered 2020-09-26: 15 mg via ORAL
  Filled 2020-09-25 (×2): qty 1

## 2020-09-25 MED ORDER — FUROSEMIDE 10 MG/ML IJ SOLN
40.0000 mg | Freq: Once | INTRAMUSCULAR | Status: AC
  Administered 2020-09-25: 40 mg via INTRAVENOUS
  Filled 2020-09-25: qty 4

## 2020-09-25 MED ORDER — SODIUM CHLORIDE 0.9% FLUSH
3.0000 mL | Freq: Two times a day (BID) | INTRAVENOUS | Status: DC
  Administered 2020-09-25 – 2020-09-26 (×2): 3 mL via INTRAVENOUS

## 2020-09-25 MED ORDER — NITROGLYCERIN 0.4 MG SL SUBL: 0.4000 mg | SUBLINGUAL_TABLET | SUBLINGUAL | Status: DC | PRN

## 2020-09-25 MED ORDER — HYDRALAZINE HCL 25 MG PO TABS
25.0000 mg | ORAL_TABLET | Freq: Three times a day (TID) | ORAL | Status: DC
  Administered 2020-09-25 – 2020-09-26 (×3): 25 mg via ORAL
  Filled 2020-09-25 (×3): qty 1

## 2020-09-25 MED ORDER — CHLORDIAZEPOXIDE HCL 5 MG PO CAPS: 5.0000 mg | ORAL_CAPSULE | Freq: Three times a day (TID) | ORAL | Status: DC | PRN

## 2020-09-25 MED ORDER — NICOTINE 21 MG/24HR TD PT24
21.0000 mg | MEDICATED_PATCH | Freq: Every day | TRANSDERMAL | Status: DC
  Filled 2020-09-25: qty 1

## 2020-09-25 MED ORDER — ATORVASTATIN CALCIUM 40 MG PO TABS
40.0000 mg | ORAL_TABLET | Freq: Every day | ORAL | Status: DC
  Filled 2020-09-25 (×2): qty 1

## 2020-09-25 MED ORDER — THIAMINE HCL 100 MG/ML IJ SOLN
100.0000 mg | Freq: Every day | INTRAMUSCULAR | Status: DC
  Filled 2020-09-25: qty 2

## 2020-09-25 MED ORDER — HEPARIN SODIUM (PORCINE) 5000 UNIT/ML IJ SOLN
5000.0000 [IU] | Freq: Three times a day (TID) | INTRAMUSCULAR | Status: DC
  Filled 2020-09-25 (×2): qty 1

## 2020-09-25 MED ORDER — METHYLPREDNISOLONE SODIUM SUCC 125 MG IJ SOLR
125.0000 mg | Freq: Once | INTRAMUSCULAR | Status: AC
  Administered 2020-09-25: 125 mg via INTRAVENOUS
  Filled 2020-09-25: qty 2

## 2020-09-25 MED ORDER — MORPHINE SULFATE (PF) 2 MG/ML IV SOLN
2.0000 mg | INTRAVENOUS | Status: DC | PRN
  Administered 2020-09-25 – 2020-09-26 (×5): 2 mg via INTRAVENOUS
  Filled 2020-09-25 (×5): qty 1

## 2020-09-25 NOTE — ED Notes (Signed)
Pt escalated after NT attempted to obtain EKG. Pt jumped up and began cursing at NT. When this writer attempted to de-escalate the situation, pt accused me of taking sides. Pt became verbally abusive to both NT and this Clinical research associate. Allowed writer to obtain blood sample through PIV, but refused EKG. Primary RN aware.

## 2020-09-25 NOTE — H&P (Signed)
History and Physical    [name] PPJ:[id number] DOB: 1966/02/09 DOA: 09/25/2020  PCP: Patient, No Pcp Per    Patient coming from:  Homeless   Chief Complaint:  SOB/ Chest pain.   HPI: [name] is a 54 y.o. male with medical history significant of CHF with reduced EF last echo in august 2021 showing improvement in EF   Pt reports that he has been coughing and having worsening sob similar to his previous episode.Pt was recently admitted on October 8,2021 and discharged on the October 12th. Pt has h/o drug abuse, DM II with last hba1c of 5.9.  Patient was uncooperative with HPI and very restless alert and oriented but dismissive.  He did not let the staff collect a troponin doing EKG and henceforth his admission and H&P has been delayed. Patient is primarily being admitted today for his left-sided chest pain, with positive cocaine in his system, with acute kidney injury, with an elevated BNP possibly reflecting CHF exacerbation.  ED Course: Blood pressure (!) 147/97, pulse 84, temperature 98 F (36.7 C), temperature source Oral, resp. rate 17, height 5\' 9"  (1.753 m), weight 87.2 kg, SpO2 96 %. Currently pt is on RA and stable, with hypertension and tachypnea. Pt seen by ED provider and found to have elevated BNP in 900's , troponin added along with drug screen and is still pending. CBC is pending, as is EKG but telemetry is said to be normal. Since discharge pt has not been taking any of his meds as he cannot afford them and he is homeless.labs also show  AKI with creatinine as below: Lab Results  Component Value Date   CREATININE 1.37 (H) 09/25/2020   CREATININE 1.28 (H) 09/11/2020   CREATININE 1.17 09/09/2020  Pt given lasix 40 mg iv and solumedrol 125 mg x 1.  Review of Systems: As per HPI otherwise all systems reviewed and negative.  Past Medical History:  Diagnosis Date  . Asthma   . Bipolar 1 disorder (HCC)   . CHF (congestive heart failure) (HCC)   . Depression    . Homelessness   . Hypertension   . QT prolongation   . Schizoaffective disorder (HCC)     History reviewed. No pertinent surgical history.   reports that he has been smoking cigarettes. He has smoked for the past 30.00 years. He has never used smokeless tobacco. He reports current alcohol use. He reports current drug use. Drugs: Cocaine and Marijuana.  Allergies  Allergen Reactions  . Ibuprofen Anaphylaxis  . Acetaminophen Hives, Nausea And Vomiting and Rash    Pt overdosed on bottle of APAP 05/06/16     Family History  Problem Relation Age of Onset  . Drug abuse Mother   . Suicidality Mother     Prior to Admission medications   Medication Sig Start Date End Date Taking? Authorizing Provider  atorvastatin (LIPITOR) 20 MG tablet Take 1 tablet (20 mg total) by mouth daily. 09/11/20   11/11/20, DO  furosemide (LASIX) 40 MG tablet Take 1 tablet (40 mg total) by mouth daily. 09/11/20 12/10/20  02/07/21, DO  gabapentin (NEURONTIN) 600 MG tablet Take 1 tablet (600 mg total) by mouth 3 (three) times daily. 09/11/20 12/10/20  02/07/21, DO  hydrALAZINE (APRESOLINE) 25 MG tablet Take 1 tablet (25 mg total) by mouth every 8 (eight) hours. 09/11/20 12/10/20  02/07/21, DO  isosorbide mononitrate (IMDUR) 30 MG 24 hr tablet Take 1 tablet (30 mg total) by mouth daily. 09/11/20 12/10/20  Noralee Stain, DO    Physical Exam: Vitals:   09/25/20 1105 09/25/20 1130 09/25/20 1600 09/25/20 1621  BP: (!) 154/108  (!) 147/97   Pulse: 91 89 84   Resp: 18  17   Temp:    98 F (36.7 C)  TempSrc:    Oral  SpO2: 96% 97% 96%   Weight:      Height:        Constitutional: NAD, calm, comfortable Vitals:   09/25/20 1105 09/25/20 1130 09/25/20 1600 09/25/20 1621  BP: (!) 154/108  (!) 147/97   Pulse: 91 89 84   Resp: 18  17   Temp:    98 F (36.7 C)  TempSrc:    Oral  SpO2: 96% 97% 96%   Weight:      Height:       Eyes: PERRL, EOMI lids and conjunctivae normal ENMT:  Mucous membranes are moist. Posterior pharynx clear of any exudate or lesions.Normal dentition.  Neck: normal, supple, no masses, no thyromegaly, no carotid bruit  Respiratory: clear to auscultation bilaterally, no wheezing, no crackles. Normal respiratory effort. No accessory muscle use.  Cardiovascular: Regular rate and rhythm, no murmurs / rubs / gallops. No extremity edema. 2+ pedal pulses. No carotid bruits.  Abdomen: no tenderness, no masses palpated. No hepatosplenomegaly. Bowel sounds positive.  Musculoskeletal: no clubbing / cyanosis. No joint deformity upper and lower extremities. Pt moving all four ext , no contractures. Normal muscle tone.  Skin: no rashes, lesions, ulcers. No induration Neurologic: CN 2-12 grossly intact. Sensation intact, DTR normal. Strength 5/5 in all 4.  Psychiatric: Normal judgment and insight. Alert and oriented x 3. Normal mood.   Labs on Admission: I have personally reviewed following labs and imaging studies  CBC: Recent Labs  Lab 09/25/20 0725  WBC 5.9  NEUTROABS 3.6  HGB 15.1  HCT 45.2  MCV 98.9  PLT 285   Basic Metabolic Panel: Recent Labs  Lab 09/25/20 0725  NA 141  K 3.6  CL 105  CO2 23  GLUCOSE 75  BUN 17  CREATININE 1.37*  CALCIUM 9.0   GFR: Estimated Creatinine Clearance: 69 mL/min (A) (by C-G formula based on SCr of 1.37 mg/dL (H)). Liver Function Tests: No results for input(s): AST, ALT, ALKPHOS, BILITOT, PROT, ALBUMIN in the last 168 hours. No results for input(s): LIPASE, AMYLASE in the last 168 hours. No results for input(s): AMMONIA in the last 168 hours. Coagulation Profile: No results for input(s): INR, PROTIME in the last 168 hours. Cardiac Enzymes: Recent Labs  Lab 09/25/20 1302  CKTOTAL 387   BNP (last 3 results) No results for input(s): PROBNP in the last 8760 hours. HbA1C: No results for input(s): HGBA1C in the last 72 hours. CBG: No results for input(s): GLUCAP in the last 168 hours. Lipid  Profile: No results for input(s): CHOL, HDL, LDLCALC, TRIG, CHOLHDL, LDLDIRECT in the last 72 hours. Thyroid Function Tests: No results for input(s): TSH, T4TOTAL, FREET4, T3FREE, THYROIDAB in the last 72 hours. Anemia Panel: No results for input(s): VITAMINB12, FOLATE, FERRITIN, TIBC, IRON, RETICCTPCT in the last 72 hours. Urine analysis:    Component Value Date/Time   COLORURINE YELLOW 08/22/2020 1127   APPEARANCEUR CLEAR 08/22/2020 1127   LABSPEC 1.010 08/22/2020 1127   PHURINE 5.0 08/22/2020 1127   GLUCOSEU NEGATIVE 08/22/2020 1127   HGBUR NEGATIVE 08/22/2020 1127   BILIRUBINUR NEGATIVE 08/22/2020 1127   KETONESUR NEGATIVE 08/22/2020 1127   PROTEINUR NEGATIVE 08/22/2020 1127   NITRITE NEGATIVE  08/22/2020 1127   LEUKOCYTESUR NEGATIVE 08/22/2020 1127    Intake/Output Summary (Last 24 hours) at 09/25/2020 1723 Last data filed at 09/25/2020 1418 Gross per 24 hour  Intake --  Output 2000 ml  Net -2000 ml   Lab Results  Component Value Date   CREATININE 1.37 (H) 09/25/2020   CREATININE 1.28 (H) 09/11/2020   CREATININE 1.17 09/09/2020    COVID-19 Labs  No results for input(s): DDIMER, FERRITIN, LDH, CRP in the last 72 hours.  Lab Results  Component Value Date   SARSCOV2NAA NEGATIVE 09/25/2020   SARSCOV2NAA NEGATIVE 09/07/2020   SARSCOV2NAA NEGATIVE 08/21/2020   SARSCOV2NAA NEGATIVE 07/21/2020    Radiological Exams on Admission: DG Chest Portable 1 View  Result Date: 09/25/2020 CLINICAL DATA:  Shortness of breath. EXAM: PORTABLE CHEST 1 VIEW COMPARISON:  09/07/2020 FINDINGS: The lungs are clear without focal pneumonia, edema, pneumothorax or pleural effusion. Interstitial markings are diffusely coarsened with chronic features. Cardiopericardial silhouette is at upper limits of normal for size. The visualized bony structures of the thorax show no acute abnormality. Telemetry leads overlie the chest. IMPRESSION: Borderline cardiomegaly.  No acute cardiopulmonary  findings. Electronically Signed   By: Kennith Center M.D.   On: 09/25/2020 06:44   DG Toe Great Right  Result Date: 09/25/2020 CLINICAL DATA:  Wound on the right great toe. EXAM: RIGHT GREAT TOE COMPARISON:  No comparison studies available. FINDINGS: No fracture. No subluxation or dislocation. No evidence for lucent lesion in the great toe. No perceptible cortical loss in the anatomy of the great toe. IMPRESSION: No radiographic features of osteomyelitis in the right great toe. Electronically Signed   By: Kennith Center M.D.   On: 09/25/2020 07:41    EKG: Independently reviewed.  Pending.  Assessment/Plan Shortness of breath/ Chest pain : -We will admit patient to MedSurg with telemetry under observation status.  -Supplemental oxygen to maintain O2 sats above 94%.  -Differentials include congestive heart failure exacerbation, ischemic heart disease, cardiomyopathy.  -Repeat echo. -diuretic held as pt is currently stable and we will tailor doses as needed. -pt has been off meds and will start hydralazine and imdur as tolerated. -strict I/o.- IV ppi. -low sodium cardiac diet.   HTN - continue imdur/hydralazine  Schizoaffective disorder, bipolar type (HCC) -pt off his meds. - consider psychiatry consult.   DM2/DM neuropathy:  -Sliding scale insulin, A1c, hypoglycemia protocol.       Polysubstance abuse: Tobacco/alcohol/cocaine -Patient states that he does smoking drink and also does drugs there must be something wrong with him he definitely needs help and if there is anybody to help him. -Psychiatry referral once stable. -Nicotine patch, thiamine, Librium. - counseled against further tobacco use.  Abdominal pain: -Suspect malingering, patient had no complaints on initial presentation on examination patient starts to roll side to side complaining of abdominal pain, behavior out of proportion to the amount of pressure applied on physical exam.  No guarding no hepatomegaly on exam no  organomegaly bowel sounds present no bruits.  Will obtain lactic and CPK, continue with IV hydration, to identify risk factors for ischemic bowel.    DVT prophylaxis:  Heparin    Code Status:  Full   Family Communication:  Duwaine Maxin friend:  [phone]  Disposition Plan:  TBD   Consults called:  None  Admission status: Observation.      Gertha Calkin MD [facility] Pager [phone] If 7PM-7AM, please contact night-coverage [email or web address] Password [facility] 09/25/2020, 5:23 PM

## 2020-09-25 NOTE — ED Notes (Signed)
Patient refusing to leave blood pressure cuff on

## 2020-09-25 NOTE — ED Notes (Signed)
Nurse asked patient to put mask on, patient refusing. Patient asked to speak to nursing supervisor. When asked why he would not answer. Nurse asked patient if she could draw his blood for labs, he ignored her and would not give answer VSS. Will continue to monitor

## 2020-09-25 NOTE — ED Notes (Signed)
Pt refused lipitor, heparin, and bloodwork. Pt states that his cholesterol is fine, he has no problems with his bloodwork, and he does not need even more.

## 2020-09-25 NOTE — ED Notes (Signed)
Patient refusing for nurse to go into the room

## 2020-09-25 NOTE — ED Notes (Signed)
Nurse and tech Threasa Alpha) into room. Nurse explained that the patient needed to get Lasix, the patient did not want to receive the lasix. Patient was educated that he needed the lasix, patient stated "Me and the doctor already talked about this, I don't give a nickel" VSS

## 2020-09-25 NOTE — ED Notes (Signed)
Pt was given graham crackers and milk as requested.

## 2020-09-25 NOTE — ED Provider Notes (Signed)
[facility]-EMERGENCY DEPT Provider Note   CSN: [id number] Arrival date & time: 09/25/20  0530     History Chief Complaint  Patient presents with  . Shortness of Breath    [name] is a 54 y.o. male with PMH of HTN, bipolar 1 disorder, polysubstance abuse including cocaine, CHF, 30-pack-year smoking history, and COPD who presents to the ED with EMS with complaints of shortness of breath.  In triage, patient was noted to be uncooperative and belligerent towards staff.  On my exam, patient tells me that for the past 3 nights he has had significant dyspnea on exertion.  He states that he cannot walk to the bathroom without becoming significantly short of breath.  He is wheezing on my exam.  He denies any fevers or chills.  He states that he was recently admitted to the hospital for CHF exacerbation and required diuresis.  He denies any edema in lower limbs, however he does endorse orthopnea.  He is also endorsing cough productive of whitish sputum and congestion symptoms.  No abdominal pain, nausea or vomiting, or chest pain.  Patient states that he has been taking albuterol, without any significant relief.  He has not used any other OTC medications for relief of his discomfort.  Lastly, patient states that he has a wound to his right big toe which has been there for "quite a while".  Patient is homeless.  He tells me that he has not been on any diuretic medications despite his history of congestive heart failure.  Patient is not immunized for COVID-19.  HPI     Past Medical History:  Diagnosis Date  . Asthma   . Bipolar 1 disorder (HCC)   . CHF (congestive heart failure) (HCC)   . Depression   . Homelessness   . Hypertension   . QT prolongation   . Schizoaffective disorder [facility])     Patient Active Problem List   Diagnosis Date Noted  . HFrEF (heart failure with reduced ejection fraction) (HCC) 09/07/2020  . Near syncope 07/21/2020  . Schizoaffective  disorder (HCC) 06/24/2019  . Shortness of breath   . CHF exacerbation (HCC) 06/21/2019  . QT prolongation 05/25/2019  . Cocaine abuse (HCC)   . Chest pain 05/24/2019  . Elevated troponin   . Homelessness   . Suicidal ideations   . Acute on chronic systolic heart failure (HCC) 04/18/2019  . Bilateral foot pain 04/05/2019  . Acute on chronic clinical systolic heart failure (HCC) 04/04/2019  . Housing situation unstable 03/28/2019  . AKI (acute kidney injury) (HCC)   . Bilateral lower extremity edema   . Acute on chronic HFrEF (heart failure with reduced ejection fraction) (HCC) 03/27/2019  . COPD exacerbation (HCC)   . Acute exacerbation of CHF (congestive heart failure) (HCC) 03/04/2019  . Asthma 12/26/2016  . Tobacco use disorder 12/26/2016  . Schizophrenia (HCC) 12/26/2016  . Cocaine abuse with cocaine-induced mood disorder (HCC) 09/06/2016  . Alcohol use disorder, mild, abuse 07/31/2016  . Substance induced mood disorder (HCC) 07/30/2016  . Schizoaffective disorder, bipolar type (HCC) 05/31/2016  . Cocaine use disorder, severe, dependence (HCC) 05/31/2016  . Essential hypertension 05/31/2016    No past surgical history on file.     Family History  Problem Relation Age of Onset  . Drug abuse Mother   . Suicidality Mother     Social History   Tobacco Use  . Smoking status: Current Every Day Smoker    Years: 30.00  Types: Cigarettes  . Smokeless tobacco: Never Used  Vaping Use  . Vaping Use: Never used  Substance Use Topics  . Alcohol use: Yes    Comment: infrequently  . Drug use: Yes    Types: Cocaine, Marijuana    Home Medications Prior to Admission medications   Medication Sig Start Date End Date Taking? Authorizing Provider  atorvastatin (LIPITOR) 20 MG tablet Take 1 tablet (20 mg total) by mouth daily. 09/11/20   Noralee Stain, DO  furosemide (LASIX) 40 MG tablet Take 1 tablet (40 mg total) by mouth daily. 09/11/20 12/10/20  Noralee Stain, DO   gabapentin (NEURONTIN) 600 MG tablet Take 1 tablet (600 mg total) by mouth 3 (three) times daily. 09/11/20 12/10/20  Noralee Stain, DO  hydrALAZINE (APRESOLINE) 25 MG tablet Take 1 tablet (25 mg total) by mouth every 8 (eight) hours. 09/11/20 12/10/20  Noralee Stain, DO  isosorbide mononitrate (IMDUR) 30 MG 24 hr tablet Take 1 tablet (30 mg total) by mouth daily. 09/11/20 12/10/20  Noralee Stain, DO    Allergies    Ibuprofen and Acetaminophen  Review of Systems   Review of Systems  All other systems reviewed and are negative.   Physical Exam Updated Vital Signs BP (!) 154/108   Pulse 91   Temp 97.6 F (36.4 C) (Axillary)   Resp 18   Ht 5\' 9"  (1.753 m)   Wt 87.2 kg   SpO2 96%   BMI 28.39 kg/m   Physical Exam Vitals and nursing note reviewed. Exam conducted with a chaperone present.  Constitutional:      General: He is not in acute distress. HENT:     Head: Normocephalic and atraumatic.  Eyes:     General: No scleral icterus.    Conjunctiva/sclera: Conjunctivae normal.  Cardiovascular:     Rate and Rhythm: Normal rate. Rhythm irregular.     Pulses: Normal pulses.     Heart sounds: Normal heart sounds.  Pulmonary:     Effort: No respiratory distress.     Breath sounds: Wheezing present.     Comments: Mildly increased work of breathing and tachypnea.  No accessory muscle use or distress noted.  Wheezing diffusely.  Symmetric chest rise. Musculoskeletal:     Right lower leg: No edema.     Left lower leg: No edema.  Skin:    General: Skin is dry.     Capillary Refill: Capillary refill takes less than 2 seconds.  Neurological:     Mental Status: He is alert and oriented to person, place, and time.     GCS: GCS eye subscore is 4. GCS verbal subscore is 5. GCS motor subscore is 6.  Psychiatric:        Mood and Affect: Mood normal.        Behavior: Behavior normal.        Thought Content: Thought content normal.     ED Results / Procedures / Treatments   Labs (all  labs ordered are listed, but only abnormal results are displayed) Labs Reviewed  BASIC METABOLIC PANEL - Abnormal; Notable for the following components:      Result Value   Creatinine, Ser 1.37 (*)    All other components within normal limits  BRAIN NATRIURETIC PEPTIDE - Abnormal; Notable for the following components:   B Natriuretic Peptide 922.9 (*)    All other components within normal limits  RESPIRATORY PANEL BY RT PCR (FLU A&B, COVID)  CBC WITH DIFFERENTIAL/PLATELET  RAPID URINE DRUG SCREEN, HOSP  PERFORMED  TROPONIN I (HIGH SENSITIVITY)    EKG None  Radiology DG Chest Portable 1 View  Result Date: 09/25/2020 CLINICAL DATA:  Shortness of breath. EXAM: PORTABLE CHEST 1 VIEW COMPARISON:  09/07/2020 FINDINGS: The lungs are clear without focal pneumonia, edema, pneumothorax or pleural effusion. Interstitial markings are diffusely coarsened with chronic features. Cardiopericardial silhouette is at upper limits of normal for size. The visualized bony structures of the thorax show no acute abnormality. Telemetry leads overlie the chest. IMPRESSION: Borderline cardiomegaly.  No acute cardiopulmonary findings. Electronically Signed   By: Kennith Center M.D.   On: 09/25/2020 06:44   DG Toe Great Right  Result Date: 09/25/2020 CLINICAL DATA:  Wound on the right great toe. EXAM: RIGHT GREAT TOE COMPARISON:  No comparison studies available. FINDINGS: No fracture. No subluxation or dislocation. No evidence for lucent lesion in the great toe. No perceptible cortical loss in the anatomy of the great toe. IMPRESSION: No radiographic features of osteomyelitis in the right great toe. Electronically Signed   By: Kennith Center M.D.   On: 09/25/2020 07:41    Procedures Procedures (including critical care time)  Medications Ordered in ED Medications  methylPREDNISolone sodium succinate (SOLU-MEDROL) 125 mg/2 mL injection 125 mg (125 mg Intravenous Refused 09/25/20 0837)  furosemide (LASIX) injection  40 mg (40 mg Intravenous Refused 09/25/20 1025)    ED Course  I have reviewed the triage vital signs and the nursing notes.  Pertinent labs & imaging results that were available during my care of the patient were reviewed by me and considered in my medical decision making (see chart for details).  Clinical Course as of Sep 26 1123  Tue Sep 25, 2020  1124 Spoke with Dr. Irena Cords who will see and admit patient.    [GG]    Clinical Course User Index [GG] Lorelee New, PA-C   MDM Rules/Calculators/A&P                          Patient's history and physical exam is suggestive of COPD exacerbation.  While patient has mild tachypnea here in the ED, he is afebrile and is oxygenating 99% on RA.  He is in no respiratory distress.  Will obtain basic laboratory work-up and imaging.  Will provide Solu-Medrol IV here in the ED for relief of the wheezing auscultated on exam.  We will also obtain BNP given his history of CHF and reports that he feels fluid overloaded.  No significant physical exam findings suggestive of of fluid overload, however he does endorse mild orthopnea at home.  Labs CBC: No anemia or leukocytosis concerning for infection. BMP: No electrolyte derangement.  Unremarkable. Respiratory panel by PCR: In process. BNP: Elevated 922.9.  Imaging DG great right toe is personally reviewed and without any radiographic features concerning for osteomyelitis. DG chest: Chronic interstitial lung markings, but no effusion or other acute cardiopulmonary disease.  On subsequent evaluation, patient is resting.  He still has a mild increased work of breathing and mild tachypnea, but no respiratory distress.  He is oxygenating well on room air.  However, given his reported dyspnea on exertion, would like to have him treated for COPD exacerbation combined with CHF exacerbation.  He does have an elevated BNP to 922.9.  While he has had elevated levels in the past, he was recently admitted to  the hospital for CHF exacerbation 09/07/2020 with a BNP that was only 390.9.  I reviewed echocardiogram obtained 07/22/2020  which demonstrated HFrEF with LVEF of 40 to 45%.  He was discharged home with continued furosemide, but reports that he was unaware he never filled his prescription medications.  We will treat patient with methylprednisolone and IV furosemide.  Will consult hospitalist for admission.    Spoke with Dr. Irena Cords who will see and admit patient.    Final Clinical Impression(s) / ED Diagnoses Final diagnoses:  Acute on chronic systolic congestive heart failure (HCC)  Acute exacerbation of chronic obstructive pulmonary disease (COPD) (HCC)    Rx / DC Orders ED Discharge Orders    None       Lorelee New, PA-C 09/25/20 1124    Benjiman Core, MD 09/25/20 1357

## 2020-09-25 NOTE — ED Notes (Signed)
House Coverage called due to patient behavior. Patient becoming increasingly aggressive towards staff

## 2020-09-25 NOTE — ED Notes (Addendum)
Art therapist has Automotive engineer to speak with pt due to him being displeased with her asking him to place his mask on while she is in the room. Upon Clinical research associate entering he stated the nurse is rude and he wants another nurse. Writer tried to explain the staff has a right to request pts wear a mask while they are in the room. He become upset stating he has CHF and cannot breath. It is noted on room air his 02 sat is WNL and resp vary from 20-24 without noted distress. Pt then states to bring him something to eat. [place] NT kindly stated she will be glad to get him some graham crackers. He also agreed to let her draw his labs. He then requested writers supervisor be called to come to his room. Writer agreed and updated it may take her a few minutes to come to department. At this time writer asked pt if she could address any other concerns. After he did not voice any writer left room and called Meriam RN [facility] asking her to come speak with him as requested. Also Gerre Pebbles PA updated with the pts dissatisfaction with staff.

## 2020-09-25 NOTE — ED Notes (Signed)
Patient refusing nurse's assistance

## 2020-09-25 NOTE — ED Notes (Signed)
Pt given meal tray. Pt resting and on the phone. No distress noted at this time.

## 2020-09-25 NOTE — ED Notes (Signed)
Pt refusing EKG reading. Writer attempted to fix current leads and pt swung his arm out almost Publishing rights manager. Pt stated " You can't be ripping things of me, you can't do that shit". Writer explain what she was doing but pt was talking over Clinical research associate. Pt proceeded to be verbally abusive to RN Foye Clock and this Clinical research associate. Pt stated he would not let anyone else near him with the leads. Writer left the room for the RN to attempt to de-escalate the situation. RN Hospital doctor, the PA and charge RN Patty were made aware.

## 2020-09-25 NOTE — ED Notes (Signed)
Patient hit the call bell and asked for the nurse. Nurse asked the patient what he needed he stated "nothing, don't worry about it"

## 2020-09-25 NOTE — ED Notes (Addendum)
Patient refusing nurse to give him his solumedrol.

## 2020-09-25 NOTE — ED Notes (Signed)
PA to bedside

## 2020-09-25 NOTE — Progress Notes (Signed)
Pt refuses lab work at this time.

## 2020-09-25 NOTE — ED Notes (Addendum)
As requested by PT, charge RN spoke to pt about his concerns. Pt agreed to have his blood drawn for labs by NT.

## 2020-09-25 NOTE — ED Notes (Addendum)
Writer attempted to draw labs. Pt consented to have his blood drawn for labs by Clinical research associate. Pt became irritated and stated he changed his mind and to remove the needle. Not enough blood was obtained for the labs. RN was notified.

## 2020-09-25 NOTE — ED Triage Notes (Signed)
Patient BIB EMS from hotel for shortness of breath. Patient states history of bronchitis, COPD, and asthma. Patient is 99% on room air, does not wear oxygen at home. Patient is belligerent with this RN, refusing to take temperature, refusing to cooperate with nurse tech, refusing to answer questions about past/current medical history. Patient is AxOx4, ambulatory without assistance.

## 2020-09-26 ENCOUNTER — Observation Stay (HOSPITAL_BASED_OUTPATIENT_CLINIC_OR_DEPARTMENT_OTHER): Payer: Medicaid - Out of State

## 2020-09-26 DIAGNOSIS — R079 Chest pain, unspecified: Secondary | ICD-10-CM | POA: Diagnosis not present

## 2020-09-26 LAB — COMPREHENSIVE METABOLIC PANEL
ALT: 23 U/L (ref 0–44)
AST: 44 U/L — ABNORMAL HIGH (ref 15–41)
Albumin: 3.7 g/dL (ref 3.5–5.0)
Alkaline Phosphatase: 68 U/L (ref 38–126)
Anion gap: 15 (ref 5–15)
BUN: 22 mg/dL — ABNORMAL HIGH (ref 6–20)
CO2: 20 mmol/L — ABNORMAL LOW (ref 22–32)
Calcium: 9.4 mg/dL (ref 8.9–10.3)
Chloride: 102 mmol/L (ref 98–111)
Creatinine, Ser: 1.45 mg/dL — ABNORMAL HIGH (ref 0.61–1.24)
GFR, Estimated: 58 mL/min — ABNORMAL LOW (ref >60)
Glucose, Bld: 177 mg/dL — ABNORMAL HIGH (ref 70–99)
Potassium: 6.1 mmol/L — ABNORMAL HIGH (ref 3.5–5.1)
Sodium: 137 mmol/L (ref 135–145)
Total Bilirubin: 1.4 mg/dL — ABNORMAL HIGH (ref 0.3–1.2)
Total Protein: 7.2 g/dL (ref 6.5–8.1)

## 2020-09-26 LAB — POTASSIUM: Potassium: 4.1 mmol/L (ref 3.5–5.1)

## 2020-09-26 LAB — CBC WITH DIFFERENTIAL/PLATELET
Abs Immature Granulocytes: 0.02 10*3/uL (ref 0.00–0.07)
Basophils Absolute: 0 10*3/uL (ref 0.0–0.1)
Basophils Relative: 0 %
Eosinophils Absolute: 0 10*3/uL (ref 0.0–0.5)
Eosinophils Relative: 0 %
HCT: 44 % (ref 39.0–52.0)
Hemoglobin: 14.9 g/dL (ref 13.0–17.0)
Immature Granulocytes: 0 %
Lymphocytes Relative: 13 %
Lymphs Abs: 0.8 10*3/uL (ref 0.7–4.0)
MCH: 33 pg (ref 26.0–34.0)
MCHC: 33.9 g/dL (ref 30.0–36.0)
MCV: 97.3 fL (ref 80.0–100.0)
Monocytes Absolute: 0.3 10*3/uL (ref 0.1–1.0)
Monocytes Relative: 4 %
Neutro Abs: 5.3 10*3/uL (ref 1.7–7.7)
Neutrophils Relative %: 83 %
Platelets: 275 10*3/uL (ref 150–400)
RBC: 4.52 MIL/uL (ref 4.22–5.81)
RDW: 15.1 % (ref 11.5–15.5)
WBC: 6.4 10*3/uL (ref 4.0–10.5)
nRBC: 0 % (ref 0.0–0.2)

## 2020-09-26 LAB — ECHOCARDIOGRAM COMPLETE
Area-P 1/2: 2.8 cm2
Height: 69 in
S' Lateral: 4.3 cm
Weight: 3075.86 oz

## 2020-09-26 LAB — TSH: TSH: 1.171 u[IU]/mL (ref 0.350–4.500)

## 2020-09-26 LAB — MAGNESIUM: Magnesium: 2.2 mg/dL (ref 1.7–2.4)

## 2020-09-26 LAB — T4, FREE: Free T4: 0.69 ng/dL (ref 0.61–1.12)

## 2020-09-26 LAB — LACTIC ACID, PLASMA: Lactic Acid, Venous: 1.2 mmol/L (ref 0.5–1.9)

## 2020-09-26 MED ORDER — GABAPENTIN 600 MG PO TABS: 600.0000 mg | ORAL_TABLET | Freq: Three times a day (TID) | ORAL | 0 refills | Status: DC

## 2020-09-26 MED ORDER — FUROSEMIDE 40 MG PO TABS
40.0000 mg | ORAL_TABLET | Freq: Every day | ORAL | Status: DC
  Administered 2020-09-26: 40 mg via ORAL

## 2020-09-26 MED ORDER — ALBUTEROL SULFATE HFA 108 (90 BASE) MCG/ACT IN AERS: 2.0000 | INHALATION_SPRAY | Freq: Four times a day (QID) | RESPIRATORY_TRACT | 2 refills | Status: DC | PRN

## 2020-09-26 MED ORDER — FUROSEMIDE 40 MG PO TABS: 40.0000 mg | ORAL_TABLET | Freq: Every day | ORAL | 0 refills | Status: DC

## 2020-09-26 MED ORDER — ATORVASTATIN CALCIUM 20 MG PO TABS: 20.0000 mg | ORAL_TABLET | Freq: Every day | ORAL | 0 refills | Status: DC

## 2020-09-26 MED ORDER — ISOSORBIDE MONONITRATE ER 30 MG PO TB24: 30.0000 mg | ORAL_TABLET | Freq: Every day | ORAL | 0 refills | Status: DC

## 2020-09-26 MED ORDER — BACITRACIN-NEOMYCIN-POLYMYXIN 400-5-5000 EX OINT
TOPICAL_OINTMENT | Freq: Two times a day (BID) | CUTANEOUS | 0 refills | Status: DC
Start: 2020-09-26 — End: 2020-11-16

## 2020-09-26 MED ORDER — HYDRALAZINE HCL 25 MG PO TABS: 25.0000 mg | ORAL_TABLET | Freq: Three times a day (TID) | ORAL | 0 refills | Status: DC

## 2020-09-26 MED ORDER — OXYCODONE HCL 5 MG PO TABS
10.0000 mg | ORAL_TABLET | ORAL | Status: DC | PRN
  Administered 2020-09-26 (×2): 10 mg via ORAL
  Filled 2020-09-26 (×2): qty 2

## 2020-09-26 MED ORDER — ATORVASTATIN CALCIUM 20 MG PO TABS
20.0000 mg | ORAL_TABLET | Freq: Every day | ORAL | 0 refills | Status: DC
Start: 2020-09-26 — End: 2020-10-07

## 2020-09-26 MED ORDER — BACITRACIN-NEOMYCIN-POLYMYXIN 400-5-5000 EX OINT: TOPICAL_OINTMENT | Freq: Two times a day (BID) | CUTANEOUS | Status: DC

## 2020-09-26 MED ORDER — FUROSEMIDE 10 MG/ML IJ SOLN
40.0000 mg | Freq: Once | INTRAMUSCULAR | Status: DC
  Filled 2020-09-26: qty 4

## 2020-09-26 MED ORDER — IPRATROPIUM-ALBUTEROL 0.5-2.5 (3) MG/3ML IN SOLN
3.0000 mL | Freq: Four times a day (QID) | RESPIRATORY_TRACT | Status: DC
  Filled 2020-09-26: qty 3

## 2020-09-26 NOTE — Discharge Summary (Signed)
Physician Discharge Summary  [name] KGM:[id number] DOB: 02-09-1966 DOA: 09/25/2020  PCP: Patient, No Pcp Per  Admit date: 09/25/2020 Discharge date: 09/26/2020  Admitted From: home Disposition:  home  Recommendations for Outpatient Follow-up:  1. Follow up with PCP in 1-2 weeks  Home Health: none Equipment/Devices: none  Discharge Condition: stable CODE STATUS: Full code Diet recommendation: heart healthy  HPI: Per admitting MD, [name] is a 54 y.o. male with medical history significant of CHF with reduced EF last echo in august 2021 showing improvement in EF  Pt reports that he has been coughing and having worsening sob similar to his previous episode.Pt was recently admitted on October 8,2021 and discharged on the October 12th. Pt has h/o drug abuse, DM II with last hba1c of 5.9.  Patient was uncooperative with HPI and very restless alert and oriented but dismissive.  He did not let the staff collect a troponin doing EKG and henceforth his admission and H&P has been delayed. Patient is primarily being admitted today for his left-sided chest pain, with positive cocaine in his system, with acute kidney injury, with an elevated BNP possibly reflecting CHF exacerbation.  Hospital Course / Discharge diagnoses: Chest pain /shortness of breath- patient was admitted to the hospital with chest pain as well as shortness of breath. His BNP was elevated.  Chest x-ray on admission showed clear lungs, no edema, no acute cardiopulmonary findings, he does not have any lower extremity edema or clinical evidence of fluid overload.  He was given IV Lasix.  Of note, he reports nonadherence to home medications and occasionally in the hospital has been refusing medical interventions including breathing treatments, subcutaneous heparin, Lipitor.  He is able to ambulate in the hallway on room air.  His high-sensitivity troponin was flat, 31 >> 28, not in a pattern consistent with ACS.  Of note,  he continues to do cocaine and admits to being dependent, he also admits to ongoing tobacco use.  Patient has shown aggressive behavior towards staff as documented in the nursing notes, he feels like were not doing anything for him and asking to go home.  Given lack of clinical fluid overload, good oxygen saturations on room air, I refilled all his home medications and will be discharged home in stable condition, he was advised for outpatient PCP follow-up, cocaine and tobacco cessation and compliance with home medications Chronic kidney disease 3a- baseline creatinine ranging, but has been around 1.2-1.5 over the last couple years.  Currently at baseline. Essential hypertension-continue home medications Hyperlipidemia-continue statin although patient refused to take it Chronic systolic CHF -appears euvolemic on exam with EF 40-45% with global hypokinesis Cocaine /tobacco use-likely contributing to his chest pain/breathing difficulties, unfortunately he continues to use, counseled to quit Right toe wound -no discharges, x-ray without osteomyelitis.  Topical treatment, outpatient follow-up  Discharge Instructions   Allergies as of 09/26/2020      Reactions   Ibuprofen Anaphylaxis   Acetaminophen Hives, Nausea And Vomiting, Rash   Pt overdosed on bottle of APAP 05/06/16      Medication List    STOP taking these medications   gabapentin 600 MG tablet Commonly known as: Neurontin     TAKE these medications   albuterol 108 (90 Base) MCG/ACT inhaler Commonly known as: VENTOLIN HFA Inhale 2 puffs into the lungs every 6 (six) hours as needed for wheezing or shortness of breath.   atorvastatin 20 MG tablet Commonly known as: LIPITOR Take 1 tablet (20 mg total) by mouth  daily.   furosemide 40 MG tablet Commonly known as: Lasix Take 1 tablet (40 mg total) by mouth daily.   hydrALAZINE 25 MG tablet Commonly known as: APRESOLINE Take 1 tablet (25 mg total) by mouth every 8 (eight) hours.    isosorbide mononitrate 30 MG 24 hr tablet Commonly known as: IMDUR Take 1 tablet (30 mg total) by mouth daily.   neomycin-bacitracin-polymyxin ointment Commonly known as: NEOSPORIN Apply topically 2 (two) times daily.       Consultations:  None   Procedures/Studies:  DG Chest Portable 1 View  Result Date: 09/25/2020 CLINICAL DATA:  Shortness of breath. EXAM: PORTABLE CHEST 1 VIEW COMPARISON:  09/07/2020 FINDINGS: The lungs are clear without focal pneumonia, edema, pneumothorax or pleural effusion. Interstitial markings are diffusely coarsened with chronic features. Cardiopericardial silhouette is at upper limits of normal for size. The visualized bony structures of the thorax show no acute abnormality. Telemetry leads overlie the chest. IMPRESSION: Borderline cardiomegaly.  No acute cardiopulmonary findings. Electronically Signed   By: Kennith Center M.D.   On: 09/25/2020 06:44   DG Chest Port 1 View  Result Date: 09/07/2020 CLINICAL DATA:  Cough and shortness of breath EXAM: PORTABLE CHEST 1 VIEW COMPARISON:  August 21, 2020 FINDINGS: Lungs are clear. Heart is mildly enlarged with pulmonary vascularity normal. No adenopathy. No bone lesions. IMPRESSION: Mild cardiac enlargement, stable.  Lungs clear. Electronically Signed   By: Bretta Bang III M.D.   On: 09/07/2020 09:38   DG Toe Great Right  Result Date: 09/25/2020 CLINICAL DATA:  Wound on the right great toe. EXAM: RIGHT GREAT TOE COMPARISON:  No comparison studies available. FINDINGS: No fracture. No subluxation or dislocation. No evidence for lucent lesion in the great toe. No perceptible cortical loss in the anatomy of the great toe. IMPRESSION: No radiographic features of osteomyelitis in the right great toe. Electronically Signed   By: Kennith Center M.D.   On: 09/25/2020 07:41   ECHOCARDIOGRAM COMPLETE  Result Date: 09/26/2020    ECHOCARDIOGRAM REPORT   Patient Name:   [name] Date of Exam: 09/26/2020  Medical Rec #:  [id number]      Height:       69.0 in Accession #:    [id number]     Weight:       192.2 lb Date of Birth:  [birth year]      BSA:          2.032 m Patient Age:    54 years       BP:           148/105 mmHg Patient Gender: M              HR:           60 bpm. Exam Location:  Inpatient Procedure: 2D Echo, 3D Echo, Color Doppler and Cardiac Doppler Indications:    R07.9* Chest pain, unspecified  History:        Patient has prior history of Echocardiogram examinations, most                 recent 07/22/2020. CHF, COPD; Risk Factors:Hypertension and                 Polysubstance abuse.  Sonographer:    Irving Burton Senior RDCS Referring Phys: [phone] EKTA V PATEL IMPRESSIONS  1. Left ventricular ejection fraction, by estimation, is 40 to 45%. The left ventricle has mildly decreased function. The left ventricle demonstrates global hypokinesis. The left ventricular internal cavity  size was mildly dilated. There is mild concentric left ventricular hypertrophy. Left ventricular diastolic parameters are consistent with Grade II diastolic dysfunction (pseudonormalization).  2. Right ventricular systolic function is normal. The right ventricular size is normal.  3. Left atrial size was moderately dilated.  4. Right atrial size was mildly dilated.  5. The mitral valve is normal in structure. Trivial mitral valve regurgitation. No evidence of mitral stenosis.  6. The aortic valve is tricuspid. There is mild calcification of the aortic valve. Aortic valve regurgitation is not visualized. Mild aortic valve sclerosis is present, with no evidence of aortic valve stenosis.  7. The inferior vena cava is normal in size with greater than 50% respiratory variability, suggesting right atrial pressure of 3 mmHg. Comparison(s): No significant change from prior study. FINDINGS  Left Ventricle: Left ventricular ejection fraction, by estimation, is 40 to 45%. The left ventricle has mildly decreased function. The left ventricle demonstrates  global hypokinesis. The left ventricular internal cavity size was mildly dilated. There is  mild concentric left ventricular hypertrophy. Left ventricular diastolic parameters are consistent with Grade II diastolic dysfunction (pseudonormalization). Right Ventricle: The right ventricular size is normal. No increase in right ventricular wall thickness. Right ventricular systolic function is normal. Left Atrium: Left atrial size was moderately dilated. Right Atrium: Right atrial size was mildly dilated. Pericardium: Trivial pericardial effusion is present. Mitral Valve: The mitral valve is normal in structure. Trivial mitral valve regurgitation. No evidence of mitral valve stenosis. Tricuspid Valve: The tricuspid valve is normal in structure. Tricuspid valve regurgitation is trivial. No evidence of tricuspid stenosis. Aortic Valve: The aortic valve is tricuspid. There is mild calcification of the aortic valve. Aortic valve regurgitation is not visualized. Mild aortic valve sclerosis is present, with no evidence of aortic valve stenosis. Pulmonic Valve: The pulmonic valve was grossly normal. Pulmonic valve regurgitation is not visualized. No evidence of pulmonic stenosis. Aorta: The aortic root, ascending aorta and aortic arch are all structurally normal, with no evidence of dilitation or obstruction. Venous: The inferior vena cava is normal in size with greater than 50% respiratory variability, suggesting right atrial pressure of 3 mmHg. IAS/Shunts: The atrial septum is grossly normal.  LEFT VENTRICLE PLAX 2D LVIDd:         5.60 cm  Diastology LVIDs:         4.30 cm  LV e' medial:    6.42 cm/s LV PW:         1.10 cm  LV E/e' medial:  16.8 LV IVS:        1.20 cm  LV e' lateral:   5.55 cm/s LVOT diam:     2.00 cm  LV E/e' lateral: 19.5 LV SV:         64 LV SV Index:   31 LVOT Area:     3.14 cm                          3D Volume EF:                         3D EF:        39 % RIGHT VENTRICLE RV S prime:     9.79 cm/s  TAPSE (M-mode): 1.9 cm LEFT ATRIUM             Index       RIGHT ATRIUM  Index LA diam:        4.50 cm 2.22 cm/m  RA Area:     20.50 cm LA Vol (A2C):   89.1 ml 43.86 ml/m RA Volume:   57.50 ml  28.30 ml/m LA Vol (A4C):   99.2 ml 48.83 ml/m LA Biplane Vol: 95.5 ml 47.01 ml/m  AORTIC VALVE LVOT Vmax:   99.90 cm/s LVOT Vmean:  67.500 cm/s LVOT VTI:    0.203 m  AORTA Ao Root diam: 3.00 cm Ao Asc diam:  3.20 cm MITRAL VALVE MV Area (PHT): 2.80 cm     SHUNTS MV Decel Time: 271 msec     Systemic VTI:  0.20 m MV E velocity: 108.00 cm/s  Systemic Diam: 2.00 cm MV A velocity: 66.00 cm/s MV E/A ratio:  1.64 Jodelle Red MD Electronically signed by Jodelle Red MD Signature Date/Time: 09/26/2020/12:20:39 PM    Final      Subjective: - no chest pain, shortness of breath, no abdominal pain, nausea or vomiting.   Discharge Exam: BP 112/70 (BP Location: Left Arm)   Pulse 78   Temp 97.9 F (36.6 C) (Oral)   Resp (!) 21   Ht  (1.753 m)   Wt 87.2 kg   SpO2 100%   BMI 28.39 kg/m   General: Pt is alert, awake, not in acute distress Cardiovascular: RRR, S1/S2 +, no rubs, no gallops Respiratory: CTA bilaterally, no wheezing, no rhonchi Abdominal: Soft, NT, ND, bowel sounds + Extremities: no edema, no cyanosis   The results of significant diagnostics from this hospitalization (including imaging, microbiology, ancillary and laboratory) are listed below for reference.     Microbiology: Recent Results (from the past 240 hour(s))  Respiratory Panel by RT PCR (Flu A&B, Covid) - Nasopharyngeal Swab     Status: None   Collection Time: 09/25/20  7:25 AM   Specimen: Nasopharyngeal Swab  Result Value Ref Range Status   SARS Coronavirus 2 by RT PCR NEGATIVE NEGATIVE Final    Comment: (NOTE) SARS-CoV-2 target nucleic acids are NOT DETECTED.  The SARS-CoV-2 RNA is generally detectable in upper respiratoy specimens during the acute phase of infection. The  lowest concentration of SARS-CoV-2 viral copies this assay can detect is 131 copies/mL. A negative result does not preclude SARS-Cov-2 infection and should not be used as the sole basis for treatment or other patient management decisions. A negative result may occur with  improper specimen collection/handling, submission of specimen other than nasopharyngeal swab, presence of viral mutation(s) within the areas targeted by this assay, and inadequate number of viral copies (<131 copies/mL). A negative result must be combined with clinical observations, patient history, and epidemiological information. The expected result is Negative.  Fact Sheet for Patients:  https://www.moore.com/  Fact Sheet for Healthcare Providers:  https://www.young.biz/  This test is no t yet approved or cleared by the [place] FDA and  has been authorized for detection and/or diagnosis of SARS-CoV-2 by FDA under an Emergency Use Authorization (EUA). This EUA will remain  in effect (meaning this test can be used) for the duration of the COVID-19 declaration under Section 564(b)(1) of the Act, 21 U.S.C. section 360bbb-3(b)(1), unless the authorization is terminated or revoked sooner.     Influenza A by PCR NEGATIVE NEGATIVE Final   Influenza B by PCR NEGATIVE NEGATIVE Final    Comment: (NOTE) The Xpert Xpress SARS-CoV-2/FLU/RSV assay is intended as an aid in  the diagnosis of influenza from Nasopharyngeal swab specimens and  should not be  used as a sole basis for treatment. Nasal washings and  aspirates are unacceptable for Xpert Xpress SARS-CoV-2/FLU/RSV  testing.  Fact Sheet for Patients: https://www.moore.com/  Fact Sheet for Healthcare Providers: https://www.young.biz/  This test is not yet approved or cleared by the [place] FDA and  has been authorized for detection and/or diagnosis of SARS-CoV-2 by  FDA under  an Emergency Use Authorization (EUA). This EUA will remain  in effect (meaning this test can be used) for the duration of the  Covid-19 declaration under Section 564(b)(1) of the Act, 21  U.S.C. section 360bbb-3(b)(1), unless the authorization is  terminated or revoked. Performed at [facility] [address], [place], [name] [address]      Labs: Basic Metabolic Panel: Recent Labs  Lab 09/25/20 0725 09/26/20 0200 09/26/20 1027  NA 141 137  --   K 3.6 6.1* 4.1  CL 105 102  --   CO2 23 20*  --   GLUCOSE 75 177*  --   BUN 17 22*  --   CREATININE 1.37* 1.45*  --   CALCIUM 9.0 9.4  --   MG  --  2.2  --    Liver Function Tests: Recent Labs  Lab 09/26/20 0200  AST 44*  ALT 23  ALKPHOS 68  BILITOT 1.4*  PROT 7.2  ALBUMIN 3.7   CBC: Recent Labs  Lab 09/25/20 0725 09/26/20 0200  WBC 5.9 6.4  NEUTROABS 3.6 5.3  HGB 15.1 14.9  HCT 45.2 44.0  MCV 98.9 97.3  PLT 285 275   CBG: No results for input(s): GLUCAP in the last 168 hours. Hgb A1c No results for input(s): HGBA1C in the last 72 hours. Lipid Profile No results for input(s): CHOL, HDL, LDLCALC, TRIG, CHOLHDL, LDLDIRECT in the last 72 hours. Thyroid function studies Recent Labs    09/26/20 0615  TSH 1.171   Urinalysis    Component Value Date/Time   COLORURINE YELLOW 08/22/2020 1127   APPEARANCEUR CLEAR 08/22/2020 1127   LABSPEC 1.010 08/22/2020 1127   PHURINE 5.0 08/22/2020 1127   GLUCOSEU NEGATIVE 08/22/2020 1127   HGBUR NEGATIVE 08/22/2020 1127   BILIRUBINUR NEGATIVE 08/22/2020 1127   KETONESUR NEGATIVE 08/22/2020 1127   PROTEINUR NEGATIVE 08/22/2020 1127   NITRITE NEGATIVE 08/22/2020 1127   LEUKOCYTESUR NEGATIVE 08/22/2020 1127    FURTHER DISCHARGE INSTRUCTIONS:   Get Medicines reviewed and adjusted: Please take all your medications with you for your next visit with your Primary MD   Laboratory/radiological data: Please request your Primary MD to go over all hospital  tests and procedure/radiological results at the follow up, please ask your Primary MD to get all Hospital records sent to his/her office.   In some cases, they will be blood work, cultures and biopsy results pending at the time of your discharge. Please request that your primary care M.D. goes through all the records of your hospital data and follows up on these results.   Also Note the following: If you experience worsening of your admission symptoms, develop shortness of breath, life threatening emergency, suicidal or homicidal thoughts you must seek medical attention immediately by calling 911 or calling your MD immediately  if symptoms less severe.   You must read complete instructions/literature along with all the possible adverse reactions/side effects for all the Medicines you take and that have been prescribed to you. Take any new Medicines after you have completely understood and accpet all the possible adverse reactions/side effects.    Do not drive  when taking Pain medications or sleeping medications (Benzodaizepines)   Do not take more than prescribed Pain, Sleep and Anxiety Medications. It is not advisable to combine anxiety,sleep and pain medications without talking with your primary care practitioner   Special Instructions: If you have smoked or chewed Tobacco  in the last 2 yrs please stop smoking, stop any regular Alcohol  and or any Recreational drug use.   Wear Seat belts while driving.   Please note: You were cared for by a hospitalist during your hospital stay. Once you are discharged, your primary care physician will handle any further medical issues. Please note that NO REFILLS for any discharge medications will be authorized once you are discharged, as it is imperative that you return to your primary care physician (or establish a relationship with a primary care physician if you do not have one) for your post hospital discharge needs so that they can reassess your need for  medications and monitor your lab values.  Time coordinating discharge: 40 minutes  SIGNED:  Pamella Pert, MD, PhD 09/26/2020, 2:20 PM

## 2020-09-26 NOTE — Progress Notes (Signed)
Echocardiogram 2D Echocardiogram has been performed.  [name] 09/26/2020, 8:38 AM

## 2020-09-26 NOTE — TOC Transition Note (Signed)
Transition of Care [facility]) - CM/SW Discharge Note   Patient Details  Name: [name] Norman MRN: [id number] Date of Birth: 21-Mar-1966  [facility] CM/SW Contact:  Darleene Cleaver, [name] Phone Number: 09/26/2020, 3:18 PM   Clinical Narrative:    CSW was informed that patient needed bus pass to discharge back home.  CSW provided bus passes for patient.  Patient was not interested in any other needs or concerns.   Final next level of care: Home/Self Care Barriers to Discharge: Barriers Resolved   Patient Goals and CMS Choice Patient states their goals for this hospitalization and ongoing recovery are:: To return back home. CMS Medicare.gov Compare Post Acute Care list provided to:: Patient Choice offered to / list presented to : Patient  Discharge Placement                       Discharge Plan and Services                  DME Agency: NA       HH Arranged: NA          Social Determinants of Health (SDOH) Interventions     Readmission Risk Interventions Readmission Risk Prevention Plan 07/23/2020 04/06/2019 03/29/2019  Transportation Screening Complete Complete Complete  PCP or Specialist Appt within 3-5 Days Complete Complete Patient refused  HRI or Home Care Consult Complete Complete -  Social Work Consult for Recovery Care Planning/Counseling Complete Complete Complete  Palliative Care Screening Not Applicable Not Applicable Not Applicable  Medication Review [employer]) Complete Complete Complete  Some recent data might be hidden

## 2020-10-02 ENCOUNTER — Emergency Department (HOSPITAL_COMMUNITY): Payer: Medicaid - Out of State

## 2020-10-02 ENCOUNTER — Other Ambulatory Visit: Payer: Self-pay

## 2020-10-02 ENCOUNTER — Inpatient Hospital Stay (HOSPITAL_COMMUNITY)
Admission: EM | Admit: 2020-10-02 | Discharge: 2020-10-07 | DRG: 291 | Disposition: A | Discharge status: Home / Self Care | Payer: Medicaid - Out of State | Attending: Internal Medicine | Admitting: Internal Medicine

## 2020-10-02 ENCOUNTER — Encounter (HOSPITAL_COMMUNITY): Payer: Self-pay | Admitting: Emergency Medicine

## 2020-10-02 DIAGNOSIS — I130 Hypertensive heart and chronic kidney disease with heart failure and stage 1 through stage 4 chronic kidney disease, or unspecified chronic kidney disease: Principal | ICD-10-CM | POA: Diagnosis present

## 2020-10-02 DIAGNOSIS — F259 Schizoaffective disorder, unspecified: Secondary | ICD-10-CM | POA: Diagnosis present

## 2020-10-02 DIAGNOSIS — E872 Acidosis: Secondary | ICD-10-CM | POA: Diagnosis present

## 2020-10-02 DIAGNOSIS — F17210 Nicotine dependence, cigarettes, uncomplicated: Secondary | ICD-10-CM | POA: Diagnosis present

## 2020-10-02 DIAGNOSIS — R17 Unspecified jaundice: Secondary | ICD-10-CM | POA: Diagnosis present

## 2020-10-02 DIAGNOSIS — R778 Other specified abnormalities of plasma proteins: Secondary | ICD-10-CM

## 2020-10-02 DIAGNOSIS — R7303 Prediabetes: Secondary | ICD-10-CM | POA: Diagnosis present

## 2020-10-02 DIAGNOSIS — F319 Bipolar disorder, unspecified: Secondary | ICD-10-CM | POA: Diagnosis present

## 2020-10-02 DIAGNOSIS — J441 Chronic obstructive pulmonary disease with (acute) exacerbation: Secondary | ICD-10-CM | POA: Diagnosis present

## 2020-10-02 DIAGNOSIS — T465X6A Underdosing of other antihypertensive drugs, initial encounter: Secondary | ICD-10-CM | POA: Diagnosis present

## 2020-10-02 DIAGNOSIS — Z886 Allergy status to analgesic agent status: Secondary | ICD-10-CM

## 2020-10-02 DIAGNOSIS — I10 Essential (primary) hypertension: Secondary | ICD-10-CM

## 2020-10-02 DIAGNOSIS — R45851 Suicidal ideations: Secondary | ICD-10-CM

## 2020-10-02 DIAGNOSIS — I5043 Acute on chronic combined systolic (congestive) and diastolic (congestive) heart failure: Secondary | ICD-10-CM | POA: Diagnosis present

## 2020-10-02 DIAGNOSIS — D72829 Elevated white blood cell count, unspecified: Secondary | ICD-10-CM | POA: Diagnosis present

## 2020-10-02 DIAGNOSIS — I509 Heart failure, unspecified: Secondary | ICD-10-CM

## 2020-10-02 DIAGNOSIS — T463X6A Underdosing of coronary vasodilators, initial encounter: Secondary | ICD-10-CM | POA: Diagnosis present

## 2020-10-02 DIAGNOSIS — R7989 Other specified abnormal findings of blood chemistry: Secondary | ICD-10-CM | POA: Diagnosis present

## 2020-10-02 DIAGNOSIS — R0602 Shortness of breath: Secondary | ICD-10-CM

## 2020-10-02 DIAGNOSIS — Z79899 Other long term (current) drug therapy: Secondary | ICD-10-CM

## 2020-10-02 DIAGNOSIS — Z5900 Homelessness unspecified: Secondary | ICD-10-CM

## 2020-10-02 DIAGNOSIS — R739 Hyperglycemia, unspecified: Secondary | ICD-10-CM | POA: Diagnosis present

## 2020-10-02 DIAGNOSIS — Z91138 Patient's unintentional underdosing of medication regimen for other reason: Secondary | ICD-10-CM

## 2020-10-02 DIAGNOSIS — I4581 Long QT syndrome: Secondary | ICD-10-CM | POA: Diagnosis present

## 2020-10-02 DIAGNOSIS — Z20822 Contact with and (suspected) exposure to covid-19: Secondary | ICD-10-CM | POA: Diagnosis present

## 2020-10-02 DIAGNOSIS — E785 Hyperlipidemia, unspecified: Secondary | ICD-10-CM | POA: Diagnosis present

## 2020-10-02 DIAGNOSIS — T501X6A Underdosing of loop [high-ceiling] diuretics, initial encounter: Secondary | ICD-10-CM | POA: Diagnosis present

## 2020-10-02 DIAGNOSIS — N1831 Chronic kidney disease, stage 3a: Secondary | ICD-10-CM | POA: Diagnosis present

## 2020-10-02 DIAGNOSIS — F1410 Cocaine abuse, uncomplicated: Secondary | ICD-10-CM | POA: Diagnosis present

## 2020-10-02 DIAGNOSIS — T466X6A Underdosing of antihyperlipidemic and antiarteriosclerotic drugs, initial encounter: Secondary | ICD-10-CM | POA: Diagnosis present

## 2020-10-02 DIAGNOSIS — R0600 Dyspnea, unspecified: Secondary | ICD-10-CM

## 2020-10-02 DIAGNOSIS — I2510 Atherosclerotic heart disease of native coronary artery without angina pectoris: Secondary | ICD-10-CM | POA: Diagnosis present

## 2020-10-02 LAB — CBC WITH DIFFERENTIAL/PLATELET
Abs Immature Granulocytes: 0.02 10*3/uL (ref 0.00–0.07)
Basophils Absolute: 0 10*3/uL (ref 0.0–0.1)
Basophils Relative: 0 %
Eosinophils Absolute: 0 10*3/uL (ref 0.0–0.5)
Eosinophils Relative: 1 %
HCT: 42.4 % (ref 39.0–52.0)
Hemoglobin: 14.2 g/dL (ref 13.0–17.0)
Immature Granulocytes: 0 %
Lymphocytes Relative: 27 %
Lymphs Abs: 1.9 10*3/uL (ref 0.7–4.0)
MCH: 33 pg (ref 26.0–34.0)
MCHC: 33.5 g/dL (ref 30.0–36.0)
MCV: 98.6 fL (ref 80.0–100.0)
Monocytes Absolute: 0.6 10*3/uL (ref 0.1–1.0)
Monocytes Relative: 9 %
Neutro Abs: 4.4 10*3/uL (ref 1.7–7.7)
Neutrophils Relative %: 63 %
Platelets: 263 10*3/uL (ref 150–400)
RBC: 4.3 MIL/uL (ref 4.22–5.81)
RDW: 15.6 % — ABNORMAL HIGH (ref 11.5–15.5)
WBC: 7 10*3/uL (ref 4.0–10.5)
nRBC: 0 % (ref 0.0–0.2)

## 2020-10-02 LAB — COMPREHENSIVE METABOLIC PANEL
ALT: 23 U/L (ref 0–44)
AST: 26 U/L (ref 15–41)
Albumin: 3.8 g/dL (ref 3.5–5.0)
Alkaline Phosphatase: 85 U/L (ref 38–126)
Anion gap: 12 (ref 5–15)
BUN: 23 mg/dL — ABNORMAL HIGH (ref 6–20)
CO2: 23 mmol/L (ref 22–32)
Calcium: 8.8 mg/dL — ABNORMAL LOW (ref 8.9–10.3)
Chloride: 103 mmol/L (ref 98–111)
Creatinine, Ser: 1.4 mg/dL — ABNORMAL HIGH (ref 0.61–1.24)
GFR, Estimated: >60 mL/min (ref >60)
Glucose, Bld: 141 mg/dL — ABNORMAL HIGH (ref 70–99)
Potassium: 3.7 mmol/L (ref 3.5–5.1)
Sodium: 138 mmol/L (ref 135–145)
Total Bilirubin: 1.9 mg/dL — ABNORMAL HIGH (ref 0.3–1.2)
Total Protein: 6.9 g/dL (ref 6.5–8.1)

## 2020-10-02 LAB — TROPONIN I (HIGH SENSITIVITY)
Troponin I (High Sensitivity): 71 ng/L — ABNORMAL HIGH (ref <18)
Troponin I (High Sensitivity): 69 ng/L — ABNORMAL HIGH (ref <18)

## 2020-10-02 LAB — BRAIN NATRIURETIC PEPTIDE: B Natriuretic Peptide: 1257.1 pg/mL — ABNORMAL HIGH (ref 0.0–100.0)

## 2020-10-02 IMAGING — DX DG CHEST 1V PORT
1 series · 1 of 1 positions shown · non-contrast
Comparison: [DATE]

CLINICAL DATA: Shortness of breath

EXAM:
PORTABLE CHEST 1 VIEW

[chest ap]
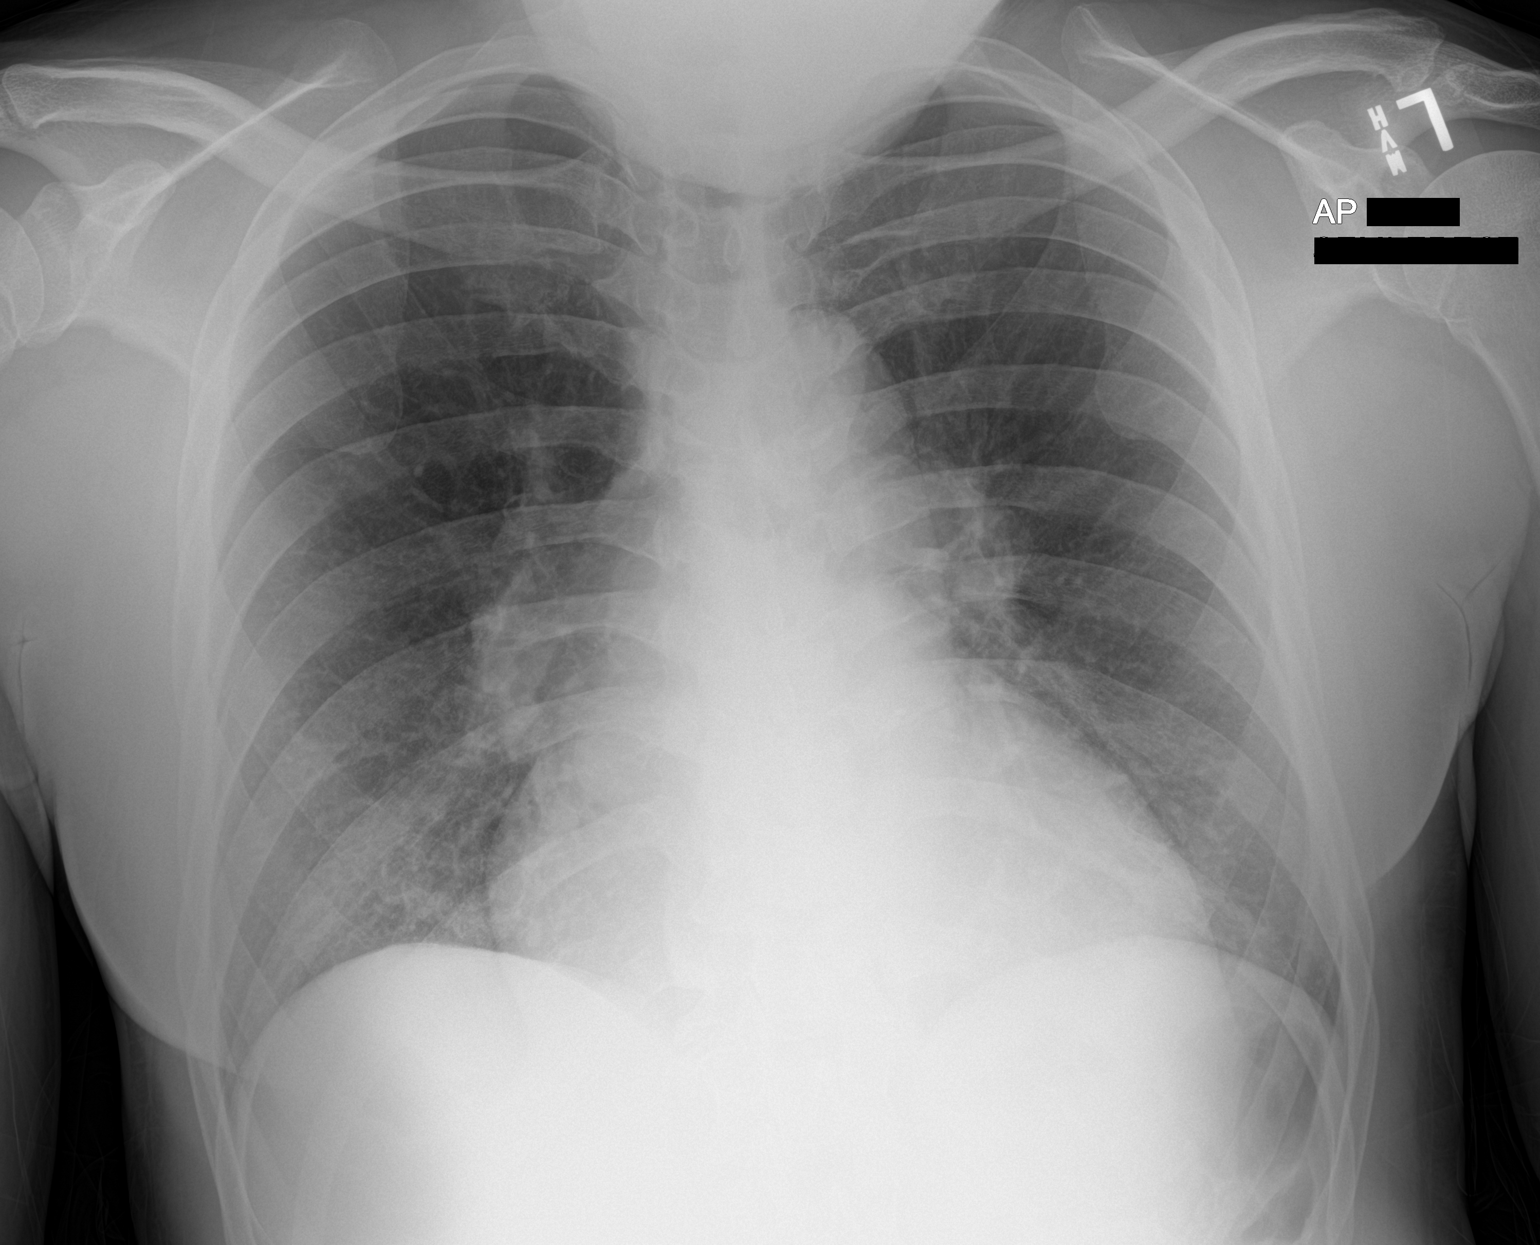

[1 of 1 positions shown; findings below may reference images not displayed]

FINDINGS: Cardiomegaly. No confluent opacities, effusions or edema. No acute
bony abnormality.
IMPRESSION: Cardiomegaly.  No active disease.

## 2020-10-02 MED ORDER — SENNOSIDES-DOCUSATE SODIUM 8.6-50 MG PO TABS: 1.0000 | ORAL_TABLET | Freq: Every evening | ORAL | Status: DC | PRN

## 2020-10-02 MED ORDER — SODIUM CHLORIDE 0.9% FLUSH
3.0000 mL | Freq: Two times a day (BID) | INTRAVENOUS | Status: DC
  Administered 2020-10-02 – 2020-10-06 (×2): 3 mL via INTRAVENOUS

## 2020-10-02 MED ORDER — ISOSORBIDE MONONITRATE ER 30 MG PO TB24
30.0000 mg | ORAL_TABLET | Freq: Every day | ORAL | Status: DC
  Administered 2020-10-03 – 2020-10-07 (×5): 30 mg via ORAL
  Filled 2020-10-02 (×6): qty 1

## 2020-10-02 MED ORDER — ATORVASTATIN CALCIUM 20 MG PO TABS
20.0000 mg | ORAL_TABLET | Freq: Every day | ORAL | Status: DC
  Administered 2020-10-03 – 2020-10-07 (×5): 20 mg via ORAL
  Filled 2020-10-02 (×5): qty 1

## 2020-10-02 MED ORDER — FUROSEMIDE 10 MG/ML IJ SOLN
40.0000 mg | Freq: Two times a day (BID) | INTRAMUSCULAR | Status: DC
  Administered 2020-10-02 – 2020-10-05 (×5): 40 mg via INTRAVENOUS
  Filled 2020-10-02 (×6): qty 4

## 2020-10-02 MED ORDER — SODIUM CHLORIDE 0.9% FLUSH: 3.0000 mL | INTRAVENOUS | Status: DC | PRN

## 2020-10-02 MED ORDER — SODIUM CHLORIDE 0.9% FLUSH
3.0000 mL | Freq: Two times a day (BID) | INTRAVENOUS | Status: DC
  Administered 2020-10-02 – 2020-10-07 (×9): 3 mL via INTRAVENOUS

## 2020-10-02 MED ORDER — NITROGLYCERIN 2 % TD OINT
1.0000 [in_us] | TOPICAL_OINTMENT | Freq: Once | TRANSDERMAL | Status: AC
  Administered 2020-10-02: 1 [in_us] via TOPICAL
  Filled 2020-10-02: qty 30

## 2020-10-02 MED ORDER — SODIUM CHLORIDE 0.9 % IV SOLN: 250.0000 mL | INTRAVENOUS | Status: DC | PRN

## 2020-10-02 MED ORDER — LIDOCAINE 5 % EX PTCH
1.0000 | MEDICATED_PATCH | CUTANEOUS | Status: DC
  Filled 2020-10-02 (×5): qty 1

## 2020-10-02 MED ORDER — HYDRALAZINE HCL 25 MG PO TABS
25.0000 mg | ORAL_TABLET | Freq: Three times a day (TID) | ORAL | Status: DC
  Administered 2020-10-03 – 2020-10-05 (×6): 25 mg via ORAL
  Filled 2020-10-02 (×6): qty 1

## 2020-10-02 MED ORDER — ENOXAPARIN SODIUM 40 MG/0.4ML ~~LOC~~ SOLN
40.0000 mg | SUBCUTANEOUS | Status: DC
  Filled 2020-10-02: qty 0.4

## 2020-10-02 NOTE — H&P (Signed)
History and Physical    [name] VKP:[id number] DOB: Mar 19, 1966 DOA: 10/02/2020  PCP: Patient, No Pcp Per   Patient coming from: Home   Chief Complaint: SOB, suicidal   HPI: [name] is a 54 y.o. male with medical history significant for chronic combined CHF, cocaine abuse, depression, and hypertension, now presenting to the emergency department for evaluation of shortness of breath and suicidal ideations.  Patient was just discharged from the hospital few days ago, has not taken any of his prescription medications since then, has become progressively dyspneic, and his been feeling hopeless and suicidal, indicating that he plans to jump out into traffic.  He denies any chest pain, fevers, or chills.  He has a cough that he describes as chronic.  ED Course: Upon arrival to the ED, patient is found to be afebrile, saturating 100% on room air, normal respiratory rate, and stable blood pressure.  EKG features sinus rhythm with LVH and prolonged QT interval.  Chest x-rays negative for acute findings.  Chemistry panel with mild elevations in creatinine and total bilirubin that appear stable.  Covid screening test not yet collected.  Troponin mildly elevated and essentially unchanged when repeated.   Review of Systems:  All other systems reviewed and apart from HPI, are negative.  Past Medical History:  Diagnosis Date  . Asthma   . Bipolar 1 disorder (HCC)   . CHF (congestive heart failure) (HCC)   . Depression   . Homelessness   . Hypertension   . QT prolongation   . Schizoaffective disorder (HCC)     History reviewed. No pertinent surgical history.  Social History:   reports that he has been smoking cigarettes. He has smoked for the past 30.00 years. He has never used smokeless tobacco. He reports current alcohol use. He reports current drug use. Drugs: Cocaine and Marijuana.  Allergies  Allergen Reactions  . Ibuprofen Anaphylaxis  . Acetaminophen Hives, Nausea And  Vomiting and Rash    Pt overdosed on bottle of APAP 05/06/16     Family History  Problem Relation Age of Onset  . Drug abuse Mother   . Suicidality Mother      Prior to Admission medications   Medication Sig Start Date End Date Taking? Authorizing Provider  albuterol (VENTOLIN HFA) 108 (90 Base) MCG/ACT inhaler Inhale 2 puffs into the lungs every 6 (six) hours as needed for wheezing or shortness of breath. 09/26/20  Yes Gherghe, Daylene Katayama, MD  atorvastatin (LIPITOR) 20 MG tablet Take 1 tablet (20 mg total) by mouth daily. Patient not taking: Reported on 10/02/2020 09/26/20   Leatha Gilding, MD  furosemide (LASIX) 40 MG tablet Take 1 tablet (40 mg total) by mouth daily. Patient not taking: Reported on 10/02/2020 09/26/20 12/25/20  Leatha Gilding, MD  hydrALAZINE (APRESOLINE) 25 MG tablet Take 1 tablet (25 mg total) by mouth every 8 (eight) hours. Patient not taking: Reported on 10/02/2020 09/26/20 12/25/20  Leatha Gilding, MD  isosorbide mononitrate (IMDUR) 30 MG 24 hr tablet Take 1 tablet (30 mg total) by mouth daily. Patient not taking: Reported on 10/02/2020 09/26/20 12/25/20  Leatha Gilding, MD  neomycin-bacitracin-polymyxin (NEOSPORIN) ointment Apply topically 2 (two) times daily. Patient not taking: Reported on 10/02/2020 09/26/20   Leatha Gilding, MD    Physical Exam: Vitals:   10/02/20 1812 10/02/20 1815 10/02/20 2012 10/02/20 2149  BP: (!) 144/101  (!) 180/105 137/90  Pulse: 97  91 90  Resp:   18 18  Temp: 98.3 F (36.8 C)     TempSrc: Oral     SpO2: 98%  99% 100%  Weight:  86.2 kg    Height:  5\' 9"  (1.753 m)      Constitutional: NAD, calm  Eyes: PERTLA, lids and conjunctivae normal ENMT: Mucous membranes are moist. Posterior pharynx clear of any exudate or lesions.   Neck: normal, supple, no masses, no thyromegaly Respiratory: clear to auscultation bilaterally, no wheezing, no crackles. No accessory muscle use.  Cardiovascular: S1 & S2 heard, regular rate  and rhythm. No extremity edema.  Abdomen: No distension, no tenderness, soft. Bowel sounds active.  Musculoskeletal: no clubbing / cyanosis. No joint deformity upper and lower extremities.   Skin: no apparent cellulitis. Warm, dry, well-perfused. Neurologic: No facial asymmetry. Sensation intact. Moving all extremities.  Psychiatric: Alert and oriented to person, place, and situation. Pleasant and cooperative.    Labs and Imaging on Admission: I have personally reviewed following labs and imaging studies  CBC: Recent Labs  Lab 09/26/20 0200 10/02/20 1945  WBC 6.4 7.0  NEUTROABS 5.3 4.4  HGB 14.9 14.2  HCT 44.0 42.4  MCV 97.3 98.6  PLT 275 263   Basic Metabolic Panel: Recent Labs  Lab 09/26/20 0200 09/26/20 1027 10/02/20 1945  NA 137  --  138  K 6.1* 4.1 3.7  CL 102  --  103  CO2 20*  --  23  GLUCOSE 177*  --  141*  BUN 22*  --  23*  CREATININE 1.45*  --  1.40*  CALCIUM 9.4  --  8.8*  MG 2.2  --   --    GFR: Estimated Creatinine Clearance: 67.1 mL/min (A) (by C-G formula based on SCr of 1.4 mg/dL (H)). Liver Function Tests: Recent Labs  Lab 09/26/20 0200 10/02/20 1945  AST 44* 26  ALT 23 23  ALKPHOS 68 85  BILITOT 1.4* 1.9*  PROT 7.2 6.9  ALBUMIN 3.7 3.8   No results for input(s): LIPASE, AMYLASE in the last 168 hours. No results for input(s): AMMONIA in the last 168 hours. Coagulation Profile: No results for input(s): INR, PROTIME in the last 168 hours. Cardiac Enzymes: No results for input(s): CKTOTAL, CKMB, CKMBINDEX, TROPONINI in the last 168 hours. BNP (last 3 results) No results for input(s): PROBNP in the last 8760 hours. HbA1C: No results for input(s): HGBA1C in the last 72 hours. CBG: No results for input(s): GLUCAP in the last 168 hours. Lipid Profile: No results for input(s): CHOL, HDL, LDLCALC, TRIG, CHOLHDL, LDLDIRECT in the last 72 hours. Thyroid Function Tests: No results for input(s): TSH, T4TOTAL, FREET4, T3FREE, THYROIDAB in the last  72 hours. Anemia Panel: No results for input(s): VITAMINB12, FOLATE, FERRITIN, TIBC, IRON, RETICCTPCT in the last 72 hours. Urine analysis:    Component Value Date/Time   COLORURINE YELLOW 08/22/2020 1127   APPEARANCEUR CLEAR 08/22/2020 1127   LABSPEC 1.010 08/22/2020 1127   PHURINE 5.0 08/22/2020 1127   GLUCOSEU NEGATIVE 08/22/2020 1127   HGBUR NEGATIVE 08/22/2020 1127   BILIRUBINUR NEGATIVE 08/22/2020 1127   KETONESUR NEGATIVE 08/22/2020 1127   PROTEINUR NEGATIVE 08/22/2020 1127   NITRITE NEGATIVE 08/22/2020 1127   LEUKOCYTESUR NEGATIVE 08/22/2020 1127   Sepsis Labs: @LABRCNTIP (procalcitonin:4,lacticidven:4) ) Recent Results (from the past 240 hour(s))  Respiratory Panel by RT PCR (Flu A&B, Covid) - Nasopharyngeal Swab     Status: None   Collection Time: 09/25/20  7:25 AM   Specimen: Nasopharyngeal Swab  Result Value Ref Range Status  SARS Coronavirus 2 by RT PCR NEGATIVE NEGATIVE Final    Comment: (NOTE) SARS-CoV-2 target nucleic acids are NOT DETECTED.  The SARS-CoV-2 RNA is generally detectable in upper respiratoy specimens during the acute phase of infection. The lowest concentration of SARS-CoV-2 viral copies this assay can detect is 131 copies/mL. A negative result does not preclude SARS-Cov-2 infection and should not be used as the sole basis for treatment or other patient management decisions. A negative result may occur with  improper specimen collection/handling, submission of specimen other than nasopharyngeal swab, presence of viral mutation(s) within the areas targeted by this assay, and inadequate number of viral copies (<131 copies/mL). A negative result must be combined with clinical observations, patient history, and epidemiological information. The expected result is Negative.  Fact Sheet for Patients:  https://www.moore.com/  Fact Sheet for Healthcare Providers:  https://www.young.biz/  This test is no t yet  approved or cleared by the [place] FDA and  has been authorized for detection and/or diagnosis of SARS-CoV-2 by FDA under an Emergency Use Authorization (EUA). This EUA will remain  in effect (meaning this test can be used) for the duration of the COVID-19 declaration under Section 564(b)(1) of the Act, 21 U.S.C. section 360bbb-3(b)(1), unless the authorization is terminated or revoked sooner.     Influenza A by PCR NEGATIVE NEGATIVE Final   Influenza B by PCR NEGATIVE NEGATIVE Final    Comment: (NOTE) The Xpert Xpress SARS-CoV-2/FLU/RSV assay is intended as an aid in  the diagnosis of influenza from Nasopharyngeal swab specimens and  should not be used as a sole basis for treatment. Nasal washings and  aspirates are unacceptable for Xpert Xpress SARS-CoV-2/FLU/RSV  testing.  Fact Sheet for Patients: https://www.moore.com/  Fact Sheet for Healthcare Providers: https://www.young.biz/  This test is not yet approved or cleared by the [place] FDA and  has been authorized for detection and/or diagnosis of SARS-CoV-2 by  FDA under an Emergency Use Authorization (EUA). This EUA will remain  in effect (meaning this test can be used) for the duration of the  Covid-19 declaration under Section 564(b)(1) of the Act, 21  U.S.C. section 360bbb-3(b)(1), unless the authorization is  terminated or revoked. Performed at [facility], [address], [place], [name] [address]      Radiological Exams on Admission: DG Chest Portable 1 View  Result Date: 10/02/2020 CLINICAL DATA:  Shortness of breath EXAM: PORTABLE CHEST 1 VIEW COMPARISON:  09/25/2020 FINDINGS: Cardiomegaly. No confluent opacities, effusions or edema. No acute bony abnormality. IMPRESSION: Cardiomegaly.  No active disease. Electronically Signed   By: Charlett Nose M.D.   On: 10/02/2020 19:01    EKG: Independently reviewed. Sinus rhythm, LVH, QTc 535 ms.    Assessment/Plan   1. Acute on chronic combined CHF  - Patient with CHF, cocaine abuse, and medication non-compliance presents with SOB  - Echo from 09/21/20 with EF 40-45% and grade II DD - Resume Lasix with IV administration initially, fluid-restrict diet, monitor I&O and daily wt, monitor electrolytes and renal function    2. Suicidal ideation  - Patient reports feelings of hopelessness with frequent SI, plans to jump into traffic  - Continue suicide precautions for now    3. Hypertension - Continue Lasix, hydralazine   4. Elevated troponin  - HS troponin mildly elevated in ED without anginal complaints   - Likely related to cocaine abuse and CHF, no further inpatient workup anticipated   5. Prolonged QT interval  - Minimize QT-prolonging medications,  monitor electrolytes and replace as needed     DVT prophylaxis: Lovenox  Code Status: Full   Family Communication: Discussed with patient  Disposition Plan:  Patient is from: Home/homeless  Anticipated d/c is to: TBD Anticipated d/c date is: 10/03/20 Patient currently: Suicidal, dyspneic  Consults called: None  Admission status: Observation    Briscoe Deutscher, MD [facility]  10/02/2020, 10:49 PM

## 2020-10-02 NOTE — ED Notes (Addendum)
This nurse entered room and introduced herself, patient states "Im going to cuss you the fu** out. How about I wont talk to you and you dont talk to me" This nurse explained that I came in because I just took over his care and brought him some medication. Patient continues to curse at staff but allows this nurse to administer lasix. Refused lovenox injection and would not let this nurse apply lidoderm patch. Patient refused a new set of vital signs.

## 2020-10-02 NOTE — ED Provider Notes (Signed)
[facility]-EMERGENCY DEPT Provider Note   CSN: [id number] Arrival date & time: 10/02/20  1800     History Chief Complaint  Patient presents with  . Medical Clearance    [name] is a 54 y.o. male with past medical history significant for asthma, bipolar 1 disorder, CHF, QT prolongation, schizoaffective disorder.  Echo on 09/26/2020 with LVEF of 40 to 45%.    HPI Patient [place] emergency department [place] park [place] with chief complaint of shortness of breath and suicidal ideations x 2 days.  He states his shortness of breath has progressively worsened. He is unable to work more than 10 feet without having to stop and catch his breath. He has not taken any medications for his symptoms prior to arrival.  Admits to chornic cough, unchanged today. Patient states he is a plan to jump in front of traffic to kill himself. Patient has history of substance abuse and admits to recent cocaine use. Denies homicidal ideations, auditory or visual hallucinations. He denies fever, chills, , hemoptysis, chest pain, abdominal pain, nausea, emesis, lower extremity edema, urinary symptoms, diarrhea.  Chart review shows patient was recently admitted in the ED from 10/26-10/27 for CHF exacerbation.  Patient states after being discharged from the hospital he has not had any of his home medications.      Past Medical History:  Diagnosis Date  . Asthma   . Bipolar 1 disorder (HCC)   . CHF (congestive heart failure) (HCC)   . Depression   . Homelessness   . Hypertension   . QT prolongation   . Schizoaffective disorder [facility])     Patient Active Problem List   Diagnosis Date Noted  . HFrEF (heart failure with reduced ejection fraction) (HCC) 09/07/2020  . Near syncope 07/21/2020  . Schizoaffective disorder (HCC) 06/24/2019  . SOB (shortness of breath)   . CHF exacerbation (HCC) 06/21/2019  . QT prolongation 05/25/2019  . Cocaine abuse (HCC)   .  Chest pain 05/24/2019  . Elevated troponin   . Homelessness   . Suicidal ideations   . Acute on chronic systolic heart failure (HCC) 04/18/2019  . Bilateral foot pain 04/05/2019  . Acute on chronic clinical systolic heart failure (HCC) 04/04/2019  . Housing situation unstable 03/28/2019  . AKI (acute kidney injury) (HCC)   . Bilateral lower extremity edema   . Acute on chronic HFrEF (heart failure with reduced ejection fraction) (HCC) 03/27/2019  . COPD exacerbation (HCC)   . Acute exacerbation of CHF (congestive heart failure) (HCC) 03/04/2019  . Asthma 12/26/2016  . Tobacco use disorder 12/26/2016  . Schizophrenia (HCC) 12/26/2016  . Cocaine abuse with cocaine-induced mood disorder (HCC) 09/06/2016  . Alcohol use disorder, mild, abuse 07/31/2016  . Substance induced mood disorder (HCC) 07/30/2016  . Schizoaffective disorder, bipolar type (HCC) 05/31/2016  . Cocaine use disorder, severe, dependence (HCC) 05/31/2016  . Essential hypertension 05/31/2016    History reviewed. No pertinent surgical history.     Family History  Problem Relation Age of Onset  . Drug abuse Mother   . Suicidality Mother     Social History   Tobacco Use  . Smoking status: Current Every Day Smoker    Years: 30.00    Types: Cigarettes  . Smokeless tobacco: Never Used  Vaping Use  . Vaping Use: Never used  Substance Use Topics  . Alcohol use: Yes    Comment: infrequently  . Drug use: Yes    Types: Cocaine,  Marijuana    Home Medications Prior to Admission medications   Medication Sig Start Date End Date Taking? Authorizing Provider  albuterol (VENTOLIN HFA) 108 (90 Base) MCG/ACT inhaler Inhale 2 puffs into the lungs every 6 (six) hours as needed for wheezing or shortness of breath. 09/26/20   Leatha Gilding, MD  atorvastatin (LIPITOR) 20 MG tablet Take 1 tablet (20 mg total) by mouth daily. 09/26/20   Leatha Gilding, MD  furosemide (LASIX) 40 MG tablet Take 1 tablet (40 mg total) by  mouth daily. 09/26/20 12/25/20  Leatha Gilding, MD  hydrALAZINE (APRESOLINE) 25 MG tablet Take 1 tablet (25 mg total) by mouth every 8 (eight) hours. 09/26/20 12/25/20  Leatha Gilding, MD  isosorbide mononitrate (IMDUR) 30 MG 24 hr tablet Take 1 tablet (30 mg total) by mouth daily. 09/26/20 12/25/20  Leatha Gilding, MD  neomycin-bacitracin-polymyxin (NEOSPORIN) ointment Apply topically 2 (two) times daily. 09/26/20   Leatha Gilding, MD    Allergies    Ibuprofen and Acetaminophen  Review of Systems   Review of Systems All other systems are reviewed and are negative for acute change except as noted in the HPI.  Physical Exam Updated Vital Signs BP (!) 144/101 (BP Location: Right Arm)   Pulse 97   Temp 98.3 F (36.8 C) (Oral)   Ht 5\' 9"  (1.753 m)   Wt 86.2 kg   SpO2 98%   BMI 28.06 kg/m   Physical Exam Vitals and nursing note reviewed.  Constitutional:      General: He is not in acute distress.    Appearance: He is not ill-appearing.  HENT:     Head: Normocephalic and atraumatic.     Right Ear: Tympanic membrane and external ear normal.     Left Ear: Tympanic membrane and external ear normal.     Nose: Nose normal.     Mouth/Throat:     Mouth: Mucous membranes are moist.     Pharynx: Oropharynx is clear.  Eyes:     General: No scleral icterus.       Right eye: No discharge.        Left eye: No discharge.     Extraocular Movements: Extraocular movements intact.     Conjunctiva/sclera: Conjunctivae normal.     Pupils: Pupils are equal, round, and reactive to light.  Neck:     Vascular: No JVD.  Cardiovascular:     Rate and Rhythm: Normal rate and regular rhythm.     Pulses: Normal pulses.          Radial pulses are 2+ on the right side and 2+ on the left side.     Heart sounds: Normal heart sounds.  Pulmonary:     Comments: Tachypneic, has accessory muscle use. Lungs clear to auscultation in all fields. Symmetric chest rise. No wheezing, rales, or rhonchi.   Oxygen saturation is 99% on room air. Abdominal:     Comments: Abdomen is soft, non-distended, and non-tender in all quadrants. No rigidity, no guarding. No peritoneal signs.  Musculoskeletal:        General: Normal range of motion.     Cervical back: Normal range of motion.     Right lower leg: No edema.     Left lower leg: No edema.  Skin:    General: Skin is warm and dry.     Capillary Refill: Capillary refill takes less than 2 seconds.  Neurological:     Mental Status: He is oriented  to person, place, and time.     GCS: GCS eye subscore is 4. GCS verbal subscore is 5. GCS motor subscore is 6.     Comments: Fluent speech, no facial droop.  Psychiatric:        Behavior: Behavior normal.     ED Results / Procedures / Treatments   Labs (all labs ordered are listed, but only abnormal results are displayed) Labs Reviewed  COMPREHENSIVE METABOLIC PANEL - Abnormal; Notable for the following components:      Result Value   Glucose, Bld 141 (*)    BUN 23 (*)    Creatinine, Ser 1.40 (*)    Calcium 8.8 (*)    Total Bilirubin 1.9 (*)    All other components within normal limits  CBC WITH DIFFERENTIAL/PLATELET - Abnormal; Notable for the following components:   RDW 15.6 (*)    All other components within normal limits  BRAIN NATRIURETIC PEPTIDE - Abnormal; Notable for the following components:   B Natriuretic Peptide 1,257.1 (*)    All other components within normal limits  TROPONIN I (HIGH SENSITIVITY) - Abnormal; Notable for the following components:   Troponin I (High Sensitivity) 71 (*)    All other components within normal limits  RESP PANEL BY RT PCR (RSV, FLU A&B, COVID)  URINALYSIS, ROUTINE W REFLEX MICROSCOPIC  RAPID URINE DRUG SCREEN, HOSP PERFORMED  TROPONIN I (HIGH SENSITIVITY)    EKG EKG Interpretation  Date/Time:  Tuesday October 02 2020 21:24:48 EDT Ventricular Rate:  88 PR Interval:    QRS Duration: 101 QT Interval:  442 QTC Calculation: 535 R  Axis:   60 Text Interpretation: Sinus rhythm Biatrial enlargement Left ventricular hypertrophy Nonspecific T abnrm, anterolateral leads Prolonged QT interval No significant change since last tracing Confirmed by Lorre Nick ([id number]) on 10/02/2020 9:35:30 PM   Radiology DG Chest Portable 1 View  Result Date: 10/02/2020 CLINICAL DATA:  Shortness of breath EXAM: PORTABLE CHEST 1 VIEW COMPARISON:  09/25/2020 FINDINGS: Cardiomegaly. No confluent opacities, effusions or edema. No acute bony abnormality. IMPRESSION: Cardiomegaly.  No active disease. Electronically Signed   By: Charlett Nose M.D.   On: 10/02/2020 19:01    Procedures Procedures (including critical care time)  Medications Ordered in ED Medications  nitroGLYCERIN (NITROGLYN) 2 % ointment 1 inch (1 inch Topical Given 10/02/20 2152)    ED Course  I have reviewed the triage vital signs and the nursing notes.  Pertinent labs & imaging results that were available during my care of the patient were reviewed by me and considered in my medical decision making (see chart for details).    MDM Rules/Calculators/A&P                          History provided by patient with additional history obtained from chart review.    Patient presenting with shortness of breath and suicidal ideations. On ED arrival he is afebrile, hypertensive. No tachycardia or hypoxia. On my exam his is tachypneic and has accessory muscle use. No wheeze or rhonchi heard. No abdominal tenderness.  No lower extremity edema. Labs show unremarkable CBC> CMP without significant electrolyte derangement, kidney function consistent with baseline.  BNP is elevated at 1257.  This is compared to 1 week ago and it was in the 900s during his last CHF exacerbation admission.  EKGs without ischemic changes.  He does have prolonged QT, will avoid further prolonging QT medications. Will give nitropaste for CHF exacerbation. Held off  on lasix as there is concern for developing   hypokalemia if given lasix which could lead to torsades.. His troponin is elevated to 71, previous were in the 30s. Will check delta troponin. Symptoms are consistent with CHF exacerbation, ACS less likely. I viewed pt's chest xray and it does not suggest acute infectious processes, no signs of volume overload. UA and UDS not yet collected.  Patient is not medically clear and will require hospital admission for CHF exacerbation. This case was discussed with Dr. Freida Busman who has seen the patient and agrees with plan to admit given patient has increased work of breathing, he does not have a pcp or any outpatient follow up. Spoke with Dr. Antionette Char with hospitalist service who agrees to assume care of patient and bring into the hospital for further evaluation and management.  Appreciate admission.   Portions of this note were generated with Scientist, clinical (histocompatibility and immunogenetics). Dictation errors may occur despite best attempts at proofreading.   Final Clinical Impression(s) / ED Diagnoses Final diagnoses:  Acute on chronic congestive heart failure, unspecified heart failure type [facility])  Suicidal ideation    Rx / DC Orders ED Discharge Orders    None       Kandice Hams 10/02/20 2320    Lorre Nick, MD 10/03/20 2339

## 2020-10-02 NOTE — ED Provider Notes (Signed)
Medical screening examination/treatment/procedure(s) were conducted as a shared visit with non-physician practitioner(s) and myself.  I personally evaluated the patient during the encounter.    54 year old male with history of homelessness and CHF presents short of breath and suicidal.  He has evidence of CHF.  Will start on nitrates and admit to the hospital   Lorre Nick, MD 10/02/20 2134

## 2020-10-02 NOTE — ED Triage Notes (Signed)
[place] with a 2-day c/o SOB and 2 days of feeling suicidal. [place] Spaniel he was going to jump [place] front of traffic [place].  Calm and cooperative during the transport.

## 2020-10-02 NOTE — ED Notes (Signed)
Pt has two pt bags on locker 28 which contains his cloths and shoe, no cell phone.

## 2020-10-02 NOTE — ED Notes (Signed)
2 full patient belongings bags have been labeled and stored in the cabinet above the 19-22 nurse's station

## 2020-10-03 DIAGNOSIS — E872 Acidosis: Secondary | ICD-10-CM | POA: Diagnosis present

## 2020-10-03 DIAGNOSIS — R7303 Prediabetes: Secondary | ICD-10-CM | POA: Diagnosis present

## 2020-10-03 DIAGNOSIS — I2510 Atherosclerotic heart disease of native coronary artery without angina pectoris: Secondary | ICD-10-CM | POA: Diagnosis present

## 2020-10-03 DIAGNOSIS — R739 Hyperglycemia, unspecified: Secondary | ICD-10-CM | POA: Diagnosis present

## 2020-10-03 DIAGNOSIS — I130 Hypertensive heart and chronic kidney disease with heart failure and stage 1 through stage 4 chronic kidney disease, or unspecified chronic kidney disease: Secondary | ICD-10-CM | POA: Diagnosis present

## 2020-10-03 DIAGNOSIS — R45851 Suicidal ideations: Secondary | ICD-10-CM | POA: Diagnosis present

## 2020-10-03 DIAGNOSIS — Z20822 Contact with and (suspected) exposure to covid-19: Secondary | ICD-10-CM | POA: Diagnosis present

## 2020-10-03 DIAGNOSIS — F319 Bipolar disorder, unspecified: Secondary | ICD-10-CM | POA: Diagnosis present

## 2020-10-03 DIAGNOSIS — Z79899 Other long term (current) drug therapy: Secondary | ICD-10-CM | POA: Diagnosis not present

## 2020-10-03 DIAGNOSIS — J441 Chronic obstructive pulmonary disease with (acute) exacerbation: Secondary | ICD-10-CM | POA: Diagnosis present

## 2020-10-03 DIAGNOSIS — Z5900 Homelessness unspecified: Secondary | ICD-10-CM | POA: Diagnosis not present

## 2020-10-03 DIAGNOSIS — R17 Unspecified jaundice: Secondary | ICD-10-CM | POA: Diagnosis present

## 2020-10-03 DIAGNOSIS — D72829 Elevated white blood cell count, unspecified: Secondary | ICD-10-CM | POA: Diagnosis present

## 2020-10-03 DIAGNOSIS — T465X6A Underdosing of other antihypertensive drugs, initial encounter: Secondary | ICD-10-CM | POA: Diagnosis present

## 2020-10-03 DIAGNOSIS — Z886 Allergy status to analgesic agent status: Secondary | ICD-10-CM | POA: Diagnosis not present

## 2020-10-03 DIAGNOSIS — F1410 Cocaine abuse, uncomplicated: Secondary | ICD-10-CM | POA: Diagnosis present

## 2020-10-03 DIAGNOSIS — I4581 Long QT syndrome: Secondary | ICD-10-CM | POA: Diagnosis present

## 2020-10-03 DIAGNOSIS — I5043 Acute on chronic combined systolic (congestive) and diastolic (congestive) heart failure: Secondary | ICD-10-CM | POA: Diagnosis present

## 2020-10-03 DIAGNOSIS — F17210 Nicotine dependence, cigarettes, uncomplicated: Secondary | ICD-10-CM | POA: Diagnosis present

## 2020-10-03 DIAGNOSIS — T466X6A Underdosing of antihyperlipidemic and antiarteriosclerotic drugs, initial encounter: Secondary | ICD-10-CM | POA: Diagnosis present

## 2020-10-03 DIAGNOSIS — E785 Hyperlipidemia, unspecified: Secondary | ICD-10-CM | POA: Diagnosis present

## 2020-10-03 DIAGNOSIS — F259 Schizoaffective disorder, unspecified: Secondary | ICD-10-CM | POA: Diagnosis present

## 2020-10-03 DIAGNOSIS — N1831 Chronic kidney disease, stage 3a: Secondary | ICD-10-CM | POA: Diagnosis present

## 2020-10-03 DIAGNOSIS — T501X6A Underdosing of loop [high-ceiling] diuretics, initial encounter: Secondary | ICD-10-CM | POA: Diagnosis present

## 2020-10-03 LAB — COMPREHENSIVE METABOLIC PANEL
ALT: 24 U/L (ref 0–44)
AST: 26 U/L (ref 15–41)
Albumin: 3.7 g/dL (ref 3.5–5.0)
Alkaline Phosphatase: 84 U/L (ref 38–126)
Anion gap: 11 (ref 5–15)
BUN: 22 mg/dL — ABNORMAL HIGH (ref 6–20)
CO2: 22 mmol/L (ref 22–32)
Calcium: 8.8 mg/dL — ABNORMAL LOW (ref 8.9–10.3)
Chloride: 106 mmol/L (ref 98–111)
Creatinine, Ser: 1.43 mg/dL — ABNORMAL HIGH (ref 0.61–1.24)
GFR, Estimated: 59 mL/min — ABNORMAL LOW (ref >60)
Glucose, Bld: 99 mg/dL (ref 70–99)
Potassium: 4.1 mmol/L (ref 3.5–5.1)
Sodium: 139 mmol/L (ref 135–145)
Total Bilirubin: 1.6 mg/dL — ABNORMAL HIGH (ref 0.3–1.2)
Total Protein: 6.7 g/dL (ref 6.5–8.1)

## 2020-10-03 LAB — CBC WITH DIFFERENTIAL/PLATELET
Abs Immature Granulocytes: 0.01 10*3/uL (ref 0.00–0.07)
Basophils Absolute: 0.1 10*3/uL (ref 0.0–0.1)
Basophils Relative: 1 %
Eosinophils Absolute: 0.1 10*3/uL (ref 0.0–0.5)
Eosinophils Relative: 1 %
HCT: 44.5 % (ref 39.0–52.0)
Hemoglobin: 15 g/dL (ref 13.0–17.0)
Immature Granulocytes: 0 %
Lymphocytes Relative: 39 %
Lymphs Abs: 2.3 10*3/uL (ref 0.7–4.0)
MCH: 33 pg (ref 26.0–34.0)
MCHC: 33.7 g/dL (ref 30.0–36.0)
MCV: 98 fL (ref 80.0–100.0)
Monocytes Absolute: 0.7 10*3/uL (ref 0.1–1.0)
Monocytes Relative: 12 %
Neutro Abs: 2.8 10*3/uL (ref 1.7–7.7)
Neutrophils Relative %: 47 %
Platelets: 276 10*3/uL (ref 150–400)
RBC: 4.54 MIL/uL (ref 4.22–5.81)
RDW: 15.5 % (ref 11.5–15.5)
WBC: 6 10*3/uL (ref 4.0–10.5)
nRBC: 0 % (ref 0.0–0.2)

## 2020-10-03 LAB — RAPID URINE DRUG SCREEN, HOSP PERFORMED
Amphetamines: NOT DETECTED
Barbiturates: NOT DETECTED
Benzodiazepines: NOT DETECTED
Cocaine: POSITIVE — AB
Opiates: NOT DETECTED
Tetrahydrocannabinol: NOT DETECTED

## 2020-10-03 LAB — PHOSPHORUS: Phosphorus: 4 mg/dL (ref 2.5–4.6)

## 2020-10-03 LAB — RESP PANEL BY RT PCR (RSV, FLU A&B, COVID)
Influenza A by PCR: NEGATIVE
Influenza B by PCR: NEGATIVE
Respiratory Syncytial Virus by PCR: NEGATIVE
SARS Coronavirus 2 by RT PCR: NEGATIVE

## 2020-10-03 LAB — MAGNESIUM: Magnesium: 2.4 mg/dL (ref 1.7–2.4)

## 2020-10-03 MED ORDER — BACITRACIN-NEOMYCIN-POLYMYXIN OINTMENT TUBE
TOPICAL_OINTMENT | Freq: Two times a day (BID) | CUTANEOUS | Status: DC
  Administered 2020-10-06: 1 via TOPICAL
  Filled 2020-10-03 (×2): qty 14.17

## 2020-10-03 MED ORDER — ALBUTEROL SULFATE HFA 108 (90 BASE) MCG/ACT IN AERS
1.0000 | INHALATION_SPRAY | RESPIRATORY_TRACT | Status: DC | PRN
  Administered 2020-10-04 (×2): 2 via RESPIRATORY_TRACT
  Filled 2020-10-03 (×3): qty 6.7

## 2020-10-03 MED ORDER — KETOROLAC TROMETHAMINE 15 MG/ML IJ SOLN
15.0000 mg | Freq: Three times a day (TID) | INTRAMUSCULAR | Status: AC | PRN
  Administered 2020-10-03: 15 mg via INTRAVENOUS
  Filled 2020-10-03: qty 1

## 2020-10-03 MED ORDER — HYDRALAZINE HCL 20 MG/ML IJ SOLN: 10.0000 mg | Freq: Four times a day (QID) | INTRAMUSCULAR | Status: DC | PRN

## 2020-10-03 MED ORDER — GABAPENTIN 100 MG PO CAPS
200.0000 mg | ORAL_CAPSULE | Freq: Two times a day (BID) | ORAL | Status: DC
  Administered 2020-10-03 – 2020-10-07 (×9): 200 mg via ORAL
  Filled 2020-10-03 (×9): qty 2

## 2020-10-03 NOTE — ED Notes (Signed)
Called mary to give report on [facility]. Nurse will call back.

## 2020-10-03 NOTE — ED Notes (Signed)
Patient refusing this nurse to obtain 5am labs or recheck vital signs.

## 2020-10-03 NOTE — ED Notes (Signed)
Patient still refusing vital recheck and blood draw, patient verbally aggressive towards staff and refusing all care. Hospitalist paged.

## 2020-10-03 NOTE — Consult Note (Signed)
[facility] Face-to-Face Psychiatry Consult   Reason for Consult:  depression Referring Physician:  IM Patient Identification: Alok Minshall MRN:  [id number] Principal Diagnosis: Acute on chronic combined systolic and diastolic CHF (congestive heart failure) (HCC) Diagnosis:  Principal Problem:   Acute on chronic combined systolic and diastolic CHF (congestive heart failure) (HCC) Active Problems:   Essential hypertension   Elevated troponin   Suicidal ideation   Total Time spent with patient: 20 minutes  Subjective:   Jeran Hiltz is a 54 y.o. male patient admitted with shortness of breath and suicidal thoughts.  Patient is very familiar to our psychiatric service line as he frequents the ER, and has a total of 5 IP admissions in the past 6 months. Upon entering the room the patient can be heard singing loudly " I want some [place], collard greens. Writer introduced self, and the reason for my visit. He appropriately acknowledges Clinical research associate and turns down his TV in my presence.  Patient states he is suicidal and no one has been willing to help him.  He acknowledges going to multiple hospitals, and no one will do anything for him.  Per patient he is interested in" going to the [address].  Maybe they can help me get placed."  Writer inquired if he was referring to [facility], in which he acknowledges yes.  Writer then informed patient this facility is a observation unit, and based on his symptoms and current needs will determine if he is placed.  He is advised this is also a walk-in facility, and not a standing hospital.  He then asked writer to leave the room.  Considering his history of violence, aggression, and verbally attacking nursing staff this writer left the room at his request.  Per chart review patient has continued to refuse labs and vital signs.  He has been very demanding while refusing all care.  HPI:  Kari Kerth is a 54 y.o. male with  medical history significant for chronic combined CHF, cocaine abuse, depression, and hypertension, now presenting to the emergency department for evaluation of shortness of breath and suicidal ideations.  Patient was just discharged from the hospital few days ago, has not taken any of his prescription medications since then, has become progressively dyspneic, and his been feeling hopeless and suicidal, indicating that he plans to jump out into traffic.  He denies any chest pain, fevers, or chills.  He has a cough that he describes as chronic.  Past Psychiatric History: History was obtained from chart review as patient was unwilling to to participate in further evaluation.  Last inpatient admission was to [facility], in which she was diagnosed with substance-induced mood disorder.  His length of stay at that time was 24 hours.  He continues to endorse suicidal ideations, that are chronic in nature per chart review as he has been suicidal every emergency room visit and inpatient admission.  It does not appear he has any current outpatient resources.  There is no documented history of actual suicide attempts in chart.  Previous diagnosis of cocaine dependence, malingering, substance-induced mood disorder, severe alcohol use disorder, unspecified personality disorder, and schizoaffective disorder.  He has been in residential substance abuse treatment previously, last treatment was [facility] in [place] and [place].   Risk to Self:  Yes Risk to Others:  Yes Prior Inpatient Therapy:  Last inpatient noted August 23, 2020 and [facility] for substance-induced mood disorder Prior  Outpatient Therapy:  Referral was set up for [facility] behavioral health services in September 2021.  No evidence of following up for that appointment.  Past Medical History:  Past Medical History:  Diagnosis Date  . Asthma   . Bipolar 1 disorder (HCC)   . CHF (congestive heart  failure) (HCC)   . Depression   . Homelessness   . Hypertension   . QT prolongation   . Schizoaffective disorder (HCC)    History reviewed. No pertinent surgical history. Family History:  Family History  Problem Relation Age of Onset  . Drug abuse Mother   . Suicidality Mother    Family Psychiatric  History: non-contributory Social History:  Social History   Substance and Sexual Activity  Alcohol Use Yes   Comment: infrequently     Social History   Substance and Sexual Activity  Drug Use Yes  . Types: Cocaine, Marijuana    Social History   Socioeconomic History  . Marital status: Single    Spouse name: Not on file  . Number of children: Not on file  . Years of education: Not on file  . Highest education level: Not on file  Occupational History  . Not on file  Tobacco Use  . Smoking status: Current Every Day Smoker    Years: 30.00    Types: Cigarettes  . Smokeless tobacco: Never Used  Vaping Use  . Vaping Use: Never used  Substance and Sexual Activity  . Alcohol use: Yes    Comment: infrequently  . Drug use: Yes    Types: Cocaine, Marijuana  . Sexual activity: Yes    Birth control/protection: None  Other Topics Concern  . Not on file  Social History Narrative  . Not on file   Social Determinants of Health   Financial Resource Strain:   . Difficulty of Paying Living Expenses: Not on file  Food Insecurity:   . Worried About Programme researcher, broadcasting/film/video in the Last Year: Not on file  . Ran Out of Food in the Last Year: Not on file  Transportation Needs:   . Lack of Transportation (Medical): Not on file  . Lack of Transportation (Non-Medical): Not on file  Physical Activity:   . Days of Exercise per Week: Not on file  . Minutes of Exercise per Session: Not on file  Stress:   . Feeling of Stress : Not on file  Social Connections:   . Frequency of Communication with Friends and Family: Not on file  . Frequency of Social Gatherings with Friends and Family: Not  on file  . Attends Religious Services: Not on file  . Active Member of Clubs or Organizations: Not on file  . Attends Banker Meetings: Not on file  . Marital Status: Not on file   Additional Social History:    Allergies:   Allergies  Allergen Reactions  . Ibuprofen Anaphylaxis  . Acetaminophen Hives, Nausea And Vomiting and Rash    Pt overdosed on bottle of APAP 05/06/16     Labs:  Results for orders placed or performed during the hospital encounter of 10/02/20 (from the past 48 hour(s))  Comprehensive metabolic panel     Status: Abnormal   Collection Time: 10/02/20  7:45 PM  Result Value Ref Range   Sodium 138 135 - 145 mmol/L   Potassium 3.7 3.5 - 5.1 mmol/L   Chloride 103 98 - 111 mmol/L   CO2 23 22 - 32 mmol/L   Glucose, Bld 141 (H)  70 - 99 mg/dL    Comment: Glucose reference range applies only to samples taken after fasting for at least 8 hours.   BUN 23 (H) 6 - 20 mg/dL   Creatinine, Ser 1.61 (H) 0.61 - 1.24 mg/dL   Calcium 8.8 (L) 8.9 - 10.3 mg/dL   Total Protein 6.9 6.5 - 8.1 g/dL   Albumin 3.8 3.5 - 5.0 g/dL   AST 26 15 - 41 U/L   ALT 23 0 - 44 U/L   Alkaline Phosphatase 85 38 - 126 U/L   Total Bilirubin 1.9 (H) 0.3 - 1.2 mg/dL   GFR, Estimated >09 >60 mL/min    Comment: (NOTE) Calculated using the CKD-EPI Creatinine Equation (2021)    Anion gap 12 5 - 15    Comment: Performed at [facility], [address], [place], [name] [address]  CBC with Differential     Status: Abnormal   Collection Time: 10/02/20  7:45 PM  Result Value Ref Range   WBC 7.0 4.0 - 10.5 K/uL   RBC 4.30 4.22 - 5.81 MIL/uL   Hemoglobin 14.2 13.0 - 17.0 g/dL   HCT 81.1 39 - 52 %   MCV 98.6 80.0 - 100.0 fL   MCH 33.0 26.0 - 34.0 pg   MCHC 33.5 30.0 - 36.0 g/dL   RDW 91.4 (H) 78.2 - 95.6 %   Platelets 263 150 - 400 K/uL   nRBC 0.0 0.0 - 0.2 %   Neutrophils Relative % 63 %   Neutro Abs 4.4 1.7 - 7.7 K/uL   Lymphocytes Relative 27 %   Lymphs Abs 1.9  0.7 - 4.0 K/uL   Monocytes Relative 9 %   Monocytes Absolute 0.6 0.1 - 1.0 K/uL   Eosinophils Relative 1 %   Eosinophils Absolute 0.0 0.0 - 0.5 K/uL   Basophils Relative 0 %   Basophils Absolute 0.0 0.0 - 0.1 K/uL   Immature Granulocytes 0 %   Abs Immature Granulocytes 0.02 0.00 - 0.07 K/uL    Comment: Performed at [facility], [address], [place], [name] [address]  Brain natriuretic peptide     Status: Abnormal   Collection Time: 10/02/20  7:45 PM  Result Value Ref Range   B Natriuretic Peptide 1,257.1 (H) 0.0 - 100.0 pg/mL    Comment: Performed at [facility], [address] [place], [name] [address]  Troponin I (High Sensitivity)     Status: Abnormal   Collection Time: 10/02/20  7:45 PM  Result Value Ref Range   Troponin I (High Sensitivity) 71 (H) <18 ng/L    Comment: (NOTE) Elevated high sensitivity troponin I (hsTnI) values and significant  changes across serial measurements may suggest ACS but many other  chronic and acute conditions are known to elevate hsTnI results.  Refer to the "Links" section for chest pain algorithms and additional  guidance. Performed at [facility], [address], [place], [name] [address]   Urine rapid drug screen (hosp performed)     Status: Abnormal   Collection Time: 10/02/20  8:38 PM  Result Value Ref Range   Opiates NONE DETECTED NONE DETECTED   Cocaine POSITIVE (A) NONE DETECTED   Benzodiazepines NONE DETECTED NONE DETECTED   Amphetamines NONE DETECTED NONE DETECTED   Tetrahydrocannabinol NONE DETECTED NONE DETECTED   Barbiturates NONE DETECTED NONE DETECTED    Comment: (NOTE) DRUG SCREEN FOR MEDICAL PURPOSES ONLY.  IF CONFIRMATION IS NEEDED FOR ANY PURPOSE, NOTIFY LAB  WITHIN 5 DAYS.  LOWEST DETECTABLE LIMITS FOR URINE DRUG SCREEN Drug Class                     Cutoff (ng/mL) Amphetamine and metabolites    1000 Barbiturate and metabolites    200 Benzodiazepine                  200 Tricyclics and metabolites     300 Opiates and metabolites        300 Cocaine and metabolites        300 THC                            50 Performed at [facility], [address] [place], [name] [address]   Troponin I (High Sensitivity)     Status: Abnormal   Collection Time: 10/02/20  9:00 PM  Result Value Ref Range   Troponin I (High Sensitivity) 69 (H) <18 ng/L    Comment: (NOTE) Elevated high sensitivity troponin I (hsTnI) values and significant  changes across serial measurements may suggest ACS but many other  chronic and acute conditions are known to elevate hsTnI results.  Refer to the "Links" section for chest pain algorithms and additional  guidance. Performed at [facility], [address], [place], [name] [address]   Resp Panel by RT PCR (RSV, Flu A&B, Covid) - Nasopharyngeal Swab     Status: None   Collection Time: 10/02/20  9:32 PM   Specimen: Nasopharyngeal Swab  Result Value Ref Range   SARS Coronavirus 2 by RT PCR NEGATIVE NEGATIVE    Comment: (NOTE) SARS-CoV-2 target nucleic acids are NOT DETECTED.  The SARS-CoV-2 RNA is generally detectable in upper respiratoy specimens during the acute phase of infection. The lowest concentration of SARS-CoV-2 viral copies this assay can detect is 131 copies/mL. A negative result does not preclude SARS-Cov-2 infection and should not be used as the sole basis for treatment or other patient management decisions. A negative result may occur with  improper specimen collection/handling, submission of specimen other than nasopharyngeal swab, presence of viral mutation(s) within the areas targeted by this assay, and inadequate number of viral copies (<131 copies/mL). A negative result must be combined with clinical observations, patient history, and epidemiological information. The expected result is Negative.  Fact Sheet for Patients:   https://www.moore.com/  Fact Sheet for Healthcare Providers:  https://www.young.biz/  This test is no t yet approved or cleared by the [place] FDA and  has been authorized for detection and/or diagnosis of SARS-CoV-2 by FDA under an Emergency Use Authorization (EUA). This EUA will remain  in effect (meaning this test can be used) for the duration of the COVID-19 declaration under Section 564(b)(1) of the Act, 21 U.S.C. section 360bbb-3(b)(1), unless the authorization is terminated or revoked sooner.     Influenza A by PCR NEGATIVE NEGATIVE   Influenza B by PCR NEGATIVE NEGATIVE    Comment: (NOTE) The Xpert Xpress SARS-CoV-2/FLU/RSV assay is intended as an aid in  the diagnosis of influenza from Nasopharyngeal swab specimens and  should not be used as a sole basis for treatment. Nasal washings and  aspirates are unacceptable for Xpert Xpress SARS-CoV-2/FLU/RSV  testing.  Fact Sheet for Patients: https://www.moore.com/  Fact Sheet for Healthcare Providers: https://www.young.biz/  This test is not yet approved or cleared by the [place] FDA and  has been authorized for detection and/or diagnosis  of SARS-CoV-2 by  FDA under an Emergency Use Authorization (EUA). This EUA will remain  in effect (meaning this test can be used) for the duration of the  Covid-19 declaration under Section 564(b)(1) of the Act, 21  U.S.C. section 360bbb-3(b)(1), unless the authorization is  terminated or revoked.    Respiratory Syncytial Virus by PCR NEGATIVE NEGATIVE    Comment: (NOTE) Fact Sheet for Patients: https://www.moore.com/  Fact Sheet for Healthcare Providers: https://www.young.biz/  This test is not yet approved or cleared by the [place] FDA and  has been authorized for detection and/or diagnosis of SARS-CoV-2 by  FDA under an Emergency Use Authorization  (EUA). This EUA will remain  in effect (meaning this test can be used) for the duration of the  COVID-19 declaration under Section 564(b)(1) of the Act, 21 U.S.C.  section 360bbb-3(b)(1), unless the authorization is terminated or  revoked. Performed at [facility], [address], [place], [name] [address]   CBC with Differential/Platelet     Status: None   Collection Time: 10/03/20  8:22 AM  Result Value Ref Range   WBC 6.0 4.0 - 10.5 K/uL   RBC 4.54 4.22 - 5.81 MIL/uL   Hemoglobin 15.0 13.0 - 17.0 g/dL   HCT 62.1 39 - 52 %   MCV 98.0 80.0 - 100.0 fL   MCH 33.0 26.0 - 34.0 pg   MCHC 33.7 30.0 - 36.0 g/dL   RDW 30.8 65.7 - 84.6 %   Platelets 276 150 - 400 K/uL   nRBC 0.0 0.0 - 0.2 %   Neutrophils Relative % 47 %   Neutro Abs 2.8 1.7 - 7.7 K/uL   Lymphocytes Relative 39 %   Lymphs Abs 2.3 0.7 - 4.0 K/uL   Monocytes Relative 12 %   Monocytes Absolute 0.7 0.1 - 1.0 K/uL   Eosinophils Relative 1 %   Eosinophils Absolute 0.1 0.0 - 0.5 K/uL   Basophils Relative 1 %   Basophils Absolute 0.1 0.0 - 0.1 K/uL   Immature Granulocytes 0 %   Abs Immature Granulocytes 0.01 0.00 - 0.07 K/uL    Comment: Performed at [facility], [address], [place], [name] [address]  Comprehensive metabolic panel     Status: Abnormal   Collection Time: 10/03/20  8:22 AM  Result Value Ref Range   Sodium 139 135 - 145 mmol/L   Potassium 4.1 3.5 - 5.1 mmol/L   Chloride 106 98 - 111 mmol/L   CO2 22 22 - 32 mmol/L   Glucose, Bld 99 70 - 99 mg/dL    Comment: Glucose reference range applies only to samples taken after fasting for at least 8 hours.   BUN 22 (H) 6 - 20 mg/dL   Creatinine, Ser 2.84 (H) 0.61 - 1.24 mg/dL   Calcium 8.8 (L) 8.9 - 10.3 mg/dL   Total Protein 6.7 6.5 - 8.1 g/dL   Albumin 3.7 3.5 - 5.0 g/dL   AST 26 15 - 41 U/L   ALT 24 0 - 44 U/L   Alkaline Phosphatase 84 38 - 126 U/L   Total Bilirubin 1.6 (H) 0.3 - 1.2 mg/dL   GFR, Estimated 59 (L)  >60 mL/min    Comment: (NOTE) Calculated using the CKD-EPI Creatinine Equation (2021)    Anion gap 11 5 - 15    Comment: Performed at [facility], [address] [place], [name] [address]  Magnesium     Status: None   Collection  Time: 10/03/20  8:22 AM  Result Value Ref Range   Magnesium 2.4 1.7 - 2.4 mg/dL    Comment: Performed at [facility], [address], [place], [name] [address]  Phosphorus     Status: None   Collection Time: 10/03/20  8:22 AM  Result Value Ref Range   Phosphorus 4.0 2.5 - 4.6 mg/dL    Comment: Performed at [facility], [address], [place], [name] [address]    Current Facility-Administered Medications  Medication Dose Route Frequency Provider Last Rate Last Admin  . 0.9 %  sodium chloride infusion  250 mL Intravenous PRN Opyd, Lavone Neri, MD      . atorvastatin (LIPITOR) tablet 20 mg  20 mg Oral Daily Opyd, Lavone Neri, MD      . enoxaparin (LOVENOX) injection 40 mg  40 mg Subcutaneous Q24H Opyd, Lavone Neri, MD      . furosemide (LASIX) injection 40 mg  40 mg Intravenous Q12H Opyd, Lavone Neri, MD   40 mg at 10/02/20 2346  . hydrALAZINE (APRESOLINE) tablet 25 mg  25 mg Oral Q8H Opyd, Lavone Neri, MD      . isosorbide mononitrate (IMDUR) 24 hr tablet 30 mg  30 mg Oral Daily Opyd, Timothy S, MD      . lidocaine (LIDODERM) 5 % 1 patch  1 patch Transdermal Q24H Opyd, Lavone Neri, MD      . senna-docusate (Senokot-S) tablet 1 tablet  1 tablet Oral QHS PRN Opyd, Lavone Neri, MD      . sodium chloride flush (NS) 0.9 % injection 3 mL  3 mL Intravenous Q12H Opyd, Lavone Neri, MD   3 mL at 10/02/20 2347  . sodium chloride flush (NS) 0.9 % injection 3 mL  3 mL Intravenous Q12H Opyd, Lavone Neri, MD   3 mL at 10/02/20 2346  . sodium chloride flush (NS) 0.9 % injection 3 mL  3 mL Intravenous PRN Opyd, Lavone Neri, MD        Musculoskeletal: Strength & Muscle Tone: within normal limits Gait & Station: normal Patient  leans: N/A  Psychiatric Specialty Exam: Physical Exam Vitals and nursing note reviewed.  Constitutional:      Appearance: He is well-developed.  HENT:     Head: Normocephalic and atraumatic.  Pulmonary:     Effort: Pulmonary effort is normal.  Neurological:     Mental Status: He is alert and oriented to person, place, and time.     Review of Systems  Psychiatric/Behavioral: Positive for depression, substance abuse and suicidal ideas (passive).  All other systems reviewed and are negative.   Blood pressure (!) 149/115, pulse 90, temperature 97.8 F (36.6 C), temperature source Oral, resp. rate 20, height 5\' 9"  (1.753 m), weight 86.2 kg, SpO2 99 %.Body mass index is 28.06 kg/m.  General Appearance: Casual  Eye Contact:  Good  Speech:  Clear and Coherent  Volume:  Normal  Mood:  Angry and Irritable  Affect:  Labile  Thought Process:  Linear and Descriptions of Associations: Intact  Orientation:  Full (Time, Place, and Person)  Thought Content:  Logical  Suicidal Thoughts:  Yes.  with intent/plan  Homicidal Thoughts:  No  Memory:  Immediate;   Poor Recent;   Poor Remote;   Poor  Judgement:  Impaired  Insight:  Lacking  Psychomotor Activity:  Normal  Concentration:  Concentration: Fair and Attention Span: Fair  Recall:  of Knowledge:  Fair  Language:  Good  Akathisia:  Negative  Handed:  Right  AIMS (if indicated):     Assets:  Desire for Improvement Resilience  ADL's:  Intact  Cognition:  WNL  Sleep:     Kaylen Nghiem 54 year old male has previous history of substance-induced mood disorder, unspecified personality disorder, who presented with shortness of breath and suicidal thoughts with a plan to walk in front of traffic.  During the evaluation patient became very irritable, interrupted this Clinical research associate and did not allow me to finish the evaluation.  He began to use sarcastic statements and became argumentative prior to asking to leave the room.  Despite  attempts at redirection he asked me to leave.  Patient current behavior is consistent with previous emergency room visits and hospital admissions, he appears to present for secondary gain likely due to housing as he is homeless and requesting to be placed in a facility for long-term.  Patient's current QTC is 535, will refrain from using antipsychotics and additional psychotropics at this time.  Although patient has been noncompliant with previous treatment plans.  He continues to endorse suicidal thoughts, unwilling to talk about his plan.   Treatment Plan Summary: Plan : Patient is a 54 YO male with the above stated PPHx who is seen in consultation.  Patient is probably augmenting symptoms secondary to homelessness etc. It is unclear whether he made it to any of the SA programs that he was referred to after his discharge from [facility]. His LFTs are normal as well as his CBC so restarting his depakote given his CHF and QTc issues is most likely the best plan for now. I do not believe his suicidal thoughts are true, versus a poor coping skill due to his homelessness and substance use.  -Will start Depakote  po BID for mood stabilization. As noted patient has previously declined treatment. 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  -Referral for inpatient substance abuse treatment facilities.   Disposition: No evidence of imminent risk to self or others at present.   Patient does not meet criteria for psychiatric inpatient admission. Social work to coordinate assistance with substance abuse treatment options for patient to include [facility], [facility], and or [facility].  Maryagnes Amos, FNP 10/03/2020 9:24 AM  Review of Systems  Psychiatric/Behavioral: Positive for depression, substance abuse and suicidal ideas (passive).  All other systems reviewed and are negative.

## 2020-10-03 NOTE — ED Notes (Signed)
[employer] nurse tech took 2 pt belonging bags up to floor with patient

## 2020-10-03 NOTE — ED Notes (Signed)
Patient given 10 graham crackers and a container of milk per request

## 2020-10-03 NOTE — ED Notes (Signed)
Patient given a sandwich per request

## 2020-10-03 NOTE — ED Notes (Signed)
Patient given a sandwich and sprite per request

## 2020-10-03 NOTE — ED Notes (Signed)
This Clinical research associate and [employer] walked into patients room to attempt vitals and blood work. This Clinical research associate asked patient would it be ok if I took his blood pressure and the nurse obtained the blood work. Pt was informed that his breakfast tray would be out in about an hour. Pt stated "I don't want any breakfast tray. I'm okay". This Clinical research associate then asked could we still get vitals and blood work. Pt responded "Didn't I say I was ok. You all can tell them that I refused, I'm okay". As this Clinical research associate was Neurosurgeon of what patient said, pt yells out at this writer "You bald headed Bit**".

## 2020-10-03 NOTE — Progress Notes (Signed)
PROGRESS NOTE    [name]  JPE:[id number] DOB: [birth year] DOA: 10/02/2020 PCP: Patient, No Pcp Per  Brief Narrative:  HPI per Dr. Odie Sera on 10/02/20 HPI: [name] is a 54 y.o. male with medical history significant for chronic combined CHF, cocaine abuse, depression, and hypertension, now presenting to the emergency department for evaluation of shortness of breath and suicidal ideations.  Patient was just discharged from the hospital few days ago, has not taken any of his prescription medications since then, has become progressively dyspneic, and his been feeling hopeless and suicidal, indicating that he plans to jump out into traffic.  He denies any chest pain, fevers, or chills.  He has a cough that he describes as chronic.  ED Course: Upon arrival to the ED, patient is found to be afebrile, saturating 100% on room air, normal respiratory rate, and stable blood pressure.  EKG features sinus rhythm with LVH and prolonged QT interval.  Chest x-rays negative for acute findings.  Chemistry panel with mild elevations in creatinine and total bilirubin that appear stable.  Covid screening test not yet collected.  Troponin mildly elevated and essentially unchanged when repeated.   **Interim History  Continues to remain volume overloaded and still getting diuresed.  Psychiatry evaluating for further management.  Assessment & Plan:   Principal Problem:   Acute on chronic combined systolic and diastolic CHF (congestive heart failure) (HCC) Active Problems:   Essential hypertension   Elevated troponin   Suicidal ideation  Acute on chronic combined Systolic and Diastolic CHF  -Patient with CHF, cocaine abuse, and medication non-compliance presents with SOB  -Has a history of nonadherence to home medications -Echo from 09/26/20 with EF 40-45% and grade II DD -BNP on Admission was elevated at 1257.1 -Currently not requiring any supplemental oxygen via nasal cannula -Resume Lasix  with IV administration initially 40 mg q12h and will continue today, fluid-restrict diet 1500 mL (Diet changed to Regular Diet from [facility]) -Continue to monitor strict I's and O's, daily weights; Patient is -1.000 Liter and Repeat Weight not done -Continue to monitor electrolytes and renal function while patient is getting diuresed -Continue to monitor for signs and symptoms of volume overload and repeat a chest x-ray in a.m.  Hyperbilirubinemia -Patient's T Bili was 1.9 and trended down to 1.6 -Likely Reactive -Repeat pending this AM; Continue to Monitor and Trend carefully -Repeat CMP in the AM  Suicidal ideation in a Hx of Schizoaffective Disorder and Bipolar 1 Disorder -Patient reports feelings of hopelessness with frequent SI, plans to jump into traffic  -Continue Suicide precautions for now and 1:1 Sitter -Psychiatry Consulted for further evaluation and recommendations  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/Tobacco Abuse -Patient was Positive for Cocaine again on UDS -Cocain and and Tobacco Counseling given for cessation  Chronic kidney disease stage IIIa -Baseline creatinine recently has been ranging from 1.2-1.5 the last few years -Presented with a BUN/creatinine of 23/1.40 and repeat this morning was 22/1.49-avoid further nephrotoxic medications, contrast dyes, hypotension and renally adjust medications  -Continue monitor and trend renal function and repeat CMP in a.m.  Essential Hypertension -Continue IV Lasix as above, and Imdur 30 mg po Daily and Hydralazine 25 mg TID -Continue to monitor blood pressures per protocol -Last blood pressure was 149/150 next-we will add IV hydralazine 10 mg every 6 hours as needed for systolic blood pressure greater than 180 or diastolic blood pressure greater than 100   Hyperlipidemia -C/w Atorvastatin 20 mg po qHS  Elevated Troponin  -HS troponin mildly elevated in ED without anginal complaints   -troponin went  from 71 and is now 56 -Likely related to cocaine abuse and CHF, no further inpatient workup anticipated  -If develops chest pain will notify Cardiology  Prolonged QT interval  -Minimize QT-prolonging medications, monitor electrolytes and replace as needed    DVT prophylaxis: Enoxaparin 40 mg sq q2h Code Status: FULL CODE Family Communication: No family present at bedside  Disposition Plan: Pending further improvement back to his dry weight as well as evaluation and clearance by psychiatry  Status is: Observation  The patient will require care spanning > 2 midnights and should be moved to inpatient because: Unsafe d/c plan, IV treatments appropriate due to intensity of illness or inability to take PO and Inpatient level of care appropriate due to severity of illness  Dispo: The patient is from: Home              Anticipated d/c is to: TBD but could be [facility]              Anticipated d/c date is: 1-2 days              Patient currently is not medically stable to d/c.  Consultants:   Psychiatry   Procedures: None  Antimicrobials:  Anti-infectives (From admission, onward)   None       Subjective: Seen and examined at bedside and he was fairly manic and easily agitated.wanted his diet change so that he can have more options.  No chest pain, lightheadedness or dizziness.  Wanting to speak with a psychiatrist but states that she made him mad and that he threw her out of the room.  No other concerns or complaints at this time.  Objective: Vitals:   10/02/20 1815 10/02/20 2012 10/02/20 2149 10/03/20 0735  BP:  (!) 180/105 137/90 (!) 145/118  Pulse:  91 90   Resp:  18 18 (!) 21  Temp:    98 F (36.7 C)  TempSrc:      SpO2:  99% 100% 100%  Weight: 86.2 kg     Height: 5\' 9"  (1.753 m)       Intake/Output Summary (Last 24 hours) at 10/03/2020 13/01/2020 Last data filed at 10/03/2020 13/01/2020 Gross per 24 hour  Intake --  Output 1000 ml  Net -1000 ml   Filed Weights   10/02/20 1815   Weight: 86.2 kg   Examination: Physical Exam:  Constitutional: WN/WD overweight slightly manic African-American male currently in NAD and appears calm but easily agitated Eyes: Lids and conjunctivae normal, sclerae anicteric  ENMT: External Ears, Nose appear normal. Grossly normal hearing.  Neck: Appears normal, supple, no cervical masses, normal ROM, no appreciable thyromegaly; mild JVD Respiratory: Diminished to auscultation bilaterally with coarse breath sounds, no wheezing, rales, rhonchi or crackles. Normal respiratory effort and patient is not tachypenic. No accessory muscle use.  Unlabored breathing Cardiovascular: RRR, no murmurs / rubs / gallops. S1 and S2 auscultated.  Minimal edema Abdomen: Soft, non-tender, distended secondary by habitus. Bowel sounds positive.  GU: Deferred. Musculoskeletal: No clubbing / cyanosis of digits/nails. No joint deformity upper and lower extremities.  Skin: No rashes on limited skin evaluation does have a right toe wound. No induration; Warm and dry.  Neurologic: CN 2-12 grossly intact with no focal deficits. Romberg sign and cerebellar reflexes not assessed.  Psychiatric: Mildly impaired judgment and insight. Alert and oriented x 3.  Slightly agitated mood and appropriate affect.  Data Reviewed: I have personally reviewed following labs and imaging studies  CBC: Recent Labs  Lab 10/02/20 1945  WBC 7.0  NEUTROABS 4.4  HGB 14.2  HCT 42.4  MCV 98.6  PLT 263   Basic Metabolic Panel: Recent Labs  Lab 09/26/20 1027 10/02/20 1945  NA  --  138  K 4.1 3.7  CL  --  103  CO2  --  23  GLUCOSE  --  141*  BUN  --  23*  CREATININE  --  1.40*  CALCIUM  --  8.8*   GFR: Estimated Creatinine Clearance: 67.1 mL/min (A) (by C-G formula based on SCr of 1.4 mg/dL (H)). Liver Function Tests: Recent Labs  Lab 10/02/20 1945  AST 26  ALT 23  ALKPHOS 85  BILITOT 1.9*  PROT 6.9  ALBUMIN 3.8   No results for input(s): LIPASE, AMYLASE in the  last 168 hours. No results for input(s): AMMONIA in the last 168 hours. Coagulation Profile: No results for input(s): INR, PROTIME in the last 168 hours. Cardiac Enzymes: No results for input(s): CKTOTAL, CKMB, CKMBINDEX, TROPONINI in the last 168 hours. BNP (last 3 results) No results for input(s): PROBNP in the last 8760 hours. HbA1C: No results for input(s): HGBA1C in the last 72 hours. CBG: No results for input(s): GLUCAP in the last 168 hours. Lipid Profile: No results for input(s): CHOL, HDL, LDLCALC, TRIG, CHOLHDL, LDLDIRECT in the last 72 hours. Thyroid Function Tests: No results for input(s): TSH, T4TOTAL, FREET4, T3FREE, THYROIDAB in the last 72 hours. Anemia Panel: No results for input(s): VITAMINB12, FOLATE, FERRITIN, TIBC, IRON, RETICCTPCT in the last 72 hours. Sepsis Labs: No results for input(s): PROCALCITON, LATICACIDVEN in the last 168 hours.  Recent Results (from the past 240 hour(s))  Respiratory Panel by RT PCR (Flu A&B, Covid) - Nasopharyngeal Swab     Status: None   Collection Time: 09/25/20  7:25 AM   Specimen: Nasopharyngeal Swab  Result Value Ref Range Status   SARS Coronavirus 2 by RT PCR NEGATIVE NEGATIVE Final    Comment: (NOTE) SARS-CoV-2 target nucleic acids are NOT DETECTED.  The SARS-CoV-2 RNA is generally detectable in upper respiratoy specimens during the acute phase of infection. The lowest concentration of SARS-CoV-2 viral copies this assay can detect is 131 copies/mL. A negative result does not preclude SARS-Cov-2 infection and should not be used as the sole basis for treatment or other patient management decisions. A negative result may occur with  improper specimen collection/handling, submission of specimen other than nasopharyngeal swab, presence of viral mutation(s) within the areas targeted by this assay, and inadequate number of viral copies (<131 copies/mL). A negative result must be combined with clinical observations, patient  history, and epidemiological information. The expected result is Negative.  Fact Sheet for Patients:  https://www.moore.com/  Fact Sheet for Healthcare Providers:  https://www.young.biz/  This test is no t yet approved or cleared by the [place] FDA and  has been authorized for detection and/or diagnosis of SARS-CoV-2 by FDA under an Emergency Use Authorization (EUA). This EUA will remain  in effect (meaning this test can be used) for the duration of the COVID-19 declaration under Section 564(b)(1) of the Act, 21 U.S.C. section 360bbb-3(b)(1), unless the authorization is terminated or revoked sooner.     Influenza A by PCR NEGATIVE NEGATIVE Final   Influenza B by PCR NEGATIVE NEGATIVE Final    Comment: (NOTE) The Xpert Xpress SARS-CoV-2/FLU/RSV assay is intended as an aid in  the diagnosis of  influenza from Nasopharyngeal swab specimens and  should not be used as a sole basis for treatment. Nasal washings and  aspirates are unacceptable for Xpert Xpress SARS-CoV-2/FLU/RSV  testing.  Fact Sheet for Patients: https://www.moore.com/  Fact Sheet for Healthcare Providers: https://www.young.biz/  This test is not yet approved or cleared by the [place] FDA and  has been authorized for detection and/or diagnosis of SARS-CoV-2 by  FDA under an Emergency Use Authorization (EUA). This EUA will remain  in effect (meaning this test can be used) for the duration of the  Covid-19 declaration under Section 564(b)(1) of the Act, 21  U.S.C. section 360bbb-3(b)(1), unless the authorization is  terminated or revoked. Performed at [facility], [address] [place], [name] [address]   Resp Panel by RT PCR (RSV, Flu A&B, Covid) - Nasopharyngeal Swab     Status: None   Collection Time: 10/02/20  9:32 PM   Specimen: Nasopharyngeal Swab  Result Value Ref Range Status   SARS Coronavirus  2 by RT PCR NEGATIVE NEGATIVE Final    Comment: (NOTE) SARS-CoV-2 target nucleic acids are NOT DETECTED.  The SARS-CoV-2 RNA is generally detectable in upper respiratoy specimens during the acute phase of infection. The lowest concentration of SARS-CoV-2 viral copies this assay can detect is 131 copies/mL. A negative result does not preclude SARS-Cov-2 infection and should not be used as the sole basis for treatment or other patient management decisions. A negative result may occur with  improper specimen collection/handling, submission of specimen other than nasopharyngeal swab, presence of viral mutation(s) within the areas targeted by this assay, and inadequate number of viral copies (<131 copies/mL). A negative result must be combined with clinical observations, patient history, and epidemiological information. The expected result is Negative.  Fact Sheet for Patients:  https://www.moore.com/  Fact Sheet for Healthcare Providers:  https://www.young.biz/  This test is no t yet approved or cleared by the [place] FDA and  has been authorized for detection and/or diagnosis of SARS-CoV-2 by FDA under an Emergency Use Authorization (EUA). This EUA will remain  in effect (meaning this test can be used) for the duration of the COVID-19 declaration under Section 564(b)(1) of the Act, 21 U.S.C. section 360bbb-3(b)(1), unless the authorization is terminated or revoked sooner.     Influenza A by PCR NEGATIVE NEGATIVE Final   Influenza B by PCR NEGATIVE NEGATIVE Final    Comment: (NOTE) The Xpert Xpress SARS-CoV-2/FLU/RSV assay is intended as an aid in  the diagnosis of influenza from Nasopharyngeal swab specimens and  should not be used as a sole basis for treatment. Nasal washings and  aspirates are unacceptable for Xpert Xpress SARS-CoV-2/FLU/RSV  testing.  Fact Sheet for Patients: https://www.moore.com/  Fact Sheet  for Healthcare Providers: https://www.young.biz/  This test is not yet approved or cleared by the [place] FDA and  has been authorized for detection and/or diagnosis of SARS-CoV-2 by  FDA under an Emergency Use Authorization (EUA). This EUA will remain  in effect (meaning this test can be used) for the duration of the  Covid-19 declaration under Section 564(b)(1) of the Act, 21  U.S.C. section 360bbb-3(b)(1), unless the authorization is  terminated or revoked.    Respiratory Syncytial Virus by PCR NEGATIVE NEGATIVE Final    Comment: (NOTE) Fact Sheet for Patients: https://www.moore.com/  Fact Sheet for Healthcare Providers: https://www.young.biz/  This test is not yet approved or cleared by the [place] FDA and  has been authorized for detection and/or diagnosis of  SARS-CoV-2 by  FDA under an Emergency Use Authorization (EUA). This EUA will remain  in effect (meaning this test can be used) for the duration of the  COVID-19 declaration under Section 564(b)(1) of the Act, 21 U.S.C.  section 360bbb-3(b)(1), unless the authorization is terminated or  revoked. Performed at [facility], [address] [name], [name] [address]     RN Pressure Injury Documentation:     Estimated body mass index is 28.06 kg/m as calculated from the following:   Height as of this encounter:  (1.753 m).   Weight as of this encounter: 86.2 kg.  Malnutrition Type:      Malnutrition Characteristics:      Nutrition Interventions:    Radiology Studies: DG Chest Portable 1 View  Result Date: 10/02/2020 CLINICAL DATA:  Shortness of breath EXAM: PORTABLE CHEST 1 VIEW COMPARISON:  09/25/2020 FINDINGS: Cardiomegaly. No confluent opacities, effusions or edema. No acute bony abnormality. IMPRESSION: Cardiomegaly.  No active disease. Electronically Signed   By: Charlett Nose M.D.   On: 10/02/2020 19:01    Scheduled Meds: . atorvastatin  20 mg Oral Daily  . enoxaparin (LOVENOX) injection  40 mg Subcutaneous Q24H  . furosemide  40 mg Intravenous Q12H  . hydrALAZINE  25 mg Oral Q8H  . isosorbide mononitrate  30 mg Oral Daily  . lidocaine  1 patch Transdermal Q24H  . sodium chloride flush  3 mL Intravenous Q12H  . sodium chloride flush  3 mL Intravenous Q12H   Continuous Infusions: . sodium chloride      LOS: 0 days   Merlene Laughter, DO [facility] PAGER is on AMION  If 7PM-7AM, please contact night-coverage [email or web address]

## 2020-10-03 NOTE — Progress Notes (Signed)
Pt reports SOB, is adamantly requesting albuterol inhaler. Respirations are WDL, breathing is even and unlabored, spo02 is 100%.  Pt is refusing lung auscultation. Explained the benefits of scheduled lasix, pt stated "Lasix does not do sh**." Pt also having c/o severe 8/10 lower mid back pain that he reports as being chronic. Pt states poor efficacy with lidocaine patch and is refusing it, does not have any other ordered analgesic. Will contact MD regarding SOB and back pain. Will continue to monitor and educate on current regiment.

## 2020-10-04 ENCOUNTER — Inpatient Hospital Stay (HOSPITAL_COMMUNITY): Payer: Medicaid - Out of State

## 2020-10-04 LAB — COMPREHENSIVE METABOLIC PANEL
ALT: 22 U/L (ref 0–44)
AST: 21 U/L (ref 15–41)
Albumin: 3.5 g/dL (ref 3.5–5.0)
Alkaline Phosphatase: 75 U/L (ref 38–126)
Anion gap: 11 (ref 5–15)
BUN: 25 mg/dL — ABNORMAL HIGH (ref 6–20)
CO2: 23 mmol/L (ref 22–32)
Calcium: 8.7 mg/dL — ABNORMAL LOW (ref 8.9–10.3)
Chloride: 105 mmol/L (ref 98–111)
Creatinine, Ser: 1.56 mg/dL — ABNORMAL HIGH (ref 0.61–1.24)
GFR, Estimated: 53 mL/min — ABNORMAL LOW (ref >60)
Glucose, Bld: 171 mg/dL — ABNORMAL HIGH (ref 70–99)
Potassium: 3.9 mmol/L (ref 3.5–5.1)
Sodium: 139 mmol/L (ref 135–145)
Total Bilirubin: 1 mg/dL (ref 0.3–1.2)
Total Protein: 6.7 g/dL (ref 6.5–8.1)

## 2020-10-04 LAB — CBC WITH DIFFERENTIAL/PLATELET
Abs Immature Granulocytes: 0.01 10*3/uL (ref 0.00–0.07)
Basophils Absolute: 0 10*3/uL (ref 0.0–0.1)
Basophils Relative: 1 %
Eosinophils Absolute: 0.1 10*3/uL (ref 0.0–0.5)
Eosinophils Relative: 1 %
HCT: 45.1 % (ref 39.0–52.0)
Hemoglobin: 15 g/dL (ref 13.0–17.0)
Immature Granulocytes: 0 %
Lymphocytes Relative: 39 %
Lymphs Abs: 1.9 10*3/uL (ref 0.7–4.0)
MCH: 32.7 pg (ref 26.0–34.0)
MCHC: 33.3 g/dL (ref 30.0–36.0)
MCV: 98.3 fL (ref 80.0–100.0)
Monocytes Absolute: 0.7 10*3/uL (ref 0.1–1.0)
Monocytes Relative: 13 %
Neutro Abs: 2.3 10*3/uL (ref 1.7–7.7)
Neutrophils Relative %: 46 %
Platelets: 244 10*3/uL (ref 150–400)
RBC: 4.59 MIL/uL (ref 4.22–5.81)
RDW: 15.4 % (ref 11.5–15.5)
WBC: 5 10*3/uL (ref 4.0–10.5)
nRBC: 0 % (ref 0.0–0.2)

## 2020-10-04 LAB — PHOSPHORUS: Phosphorus: 4.6 mg/dL (ref 2.5–4.6)

## 2020-10-04 LAB — BRAIN NATRIURETIC PEPTIDE: B Natriuretic Peptide: 325.5 pg/mL — ABNORMAL HIGH (ref 0.0–100.0)

## 2020-10-04 LAB — MAGNESIUM: Magnesium: 2.2 mg/dL (ref 1.7–2.4)

## 2020-10-04 IMAGING — DX DG CHEST 1V PORT
1 series · 1 of 1 positions shown · non-contrast
Comparison: Prior chest radiograph [DATE].

CLINICAL DATA: Dyspnea.

EXAM:
PORTABLE CHEST 1 VIEW

[chest ap]
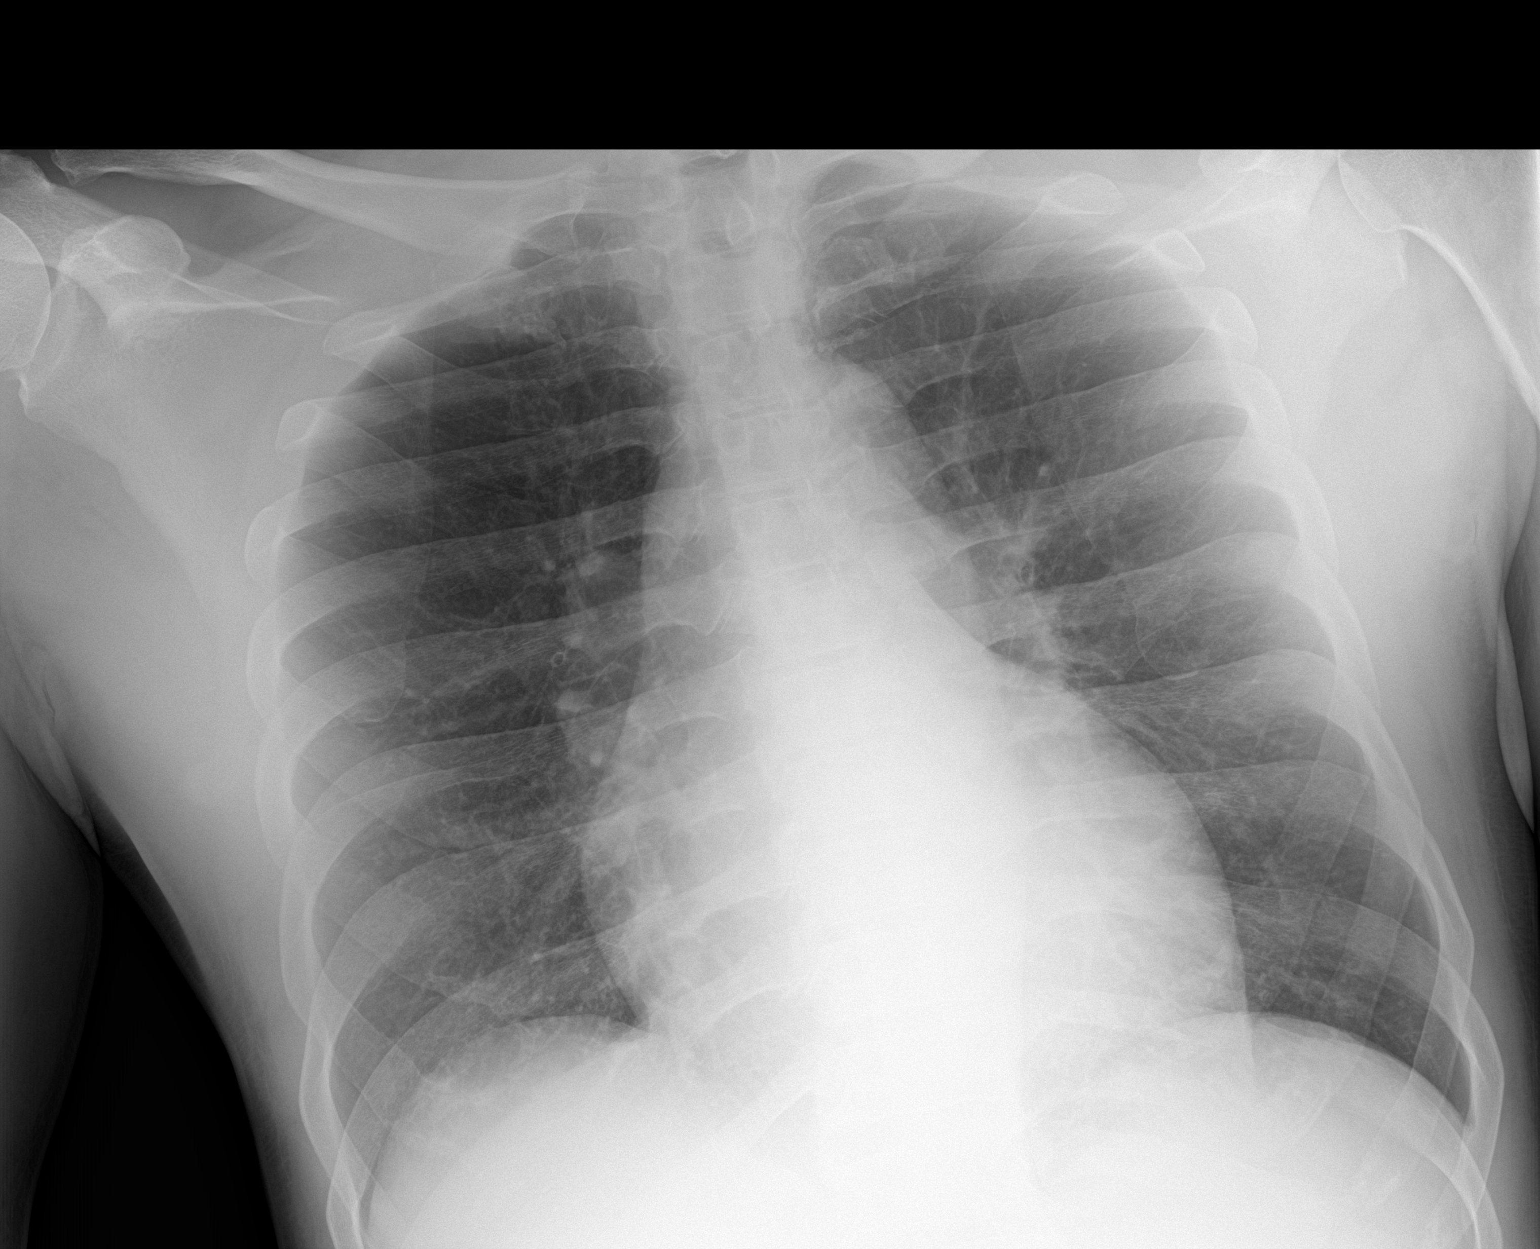

[1 of 1 positions shown; findings below may reference images not displayed]

FINDINGS: Unchanged cardiomegaly. No appreciable airspace consolidation or
pulmonary edema. No evidence of pleural effusion or pneumothorax. No
acute bony abnormality identified.
IMPRESSION: Cardiomegaly.  No evidence of acute cardiopulmonary abnormality.

## 2020-10-04 MED ORDER — OXYCODONE HCL 5 MG PO TABS
5.0000 mg | ORAL_TABLET | Freq: Four times a day (QID) | ORAL | Status: DC | PRN
  Administered 2020-10-05 – 2020-10-07 (×9): 5 mg via ORAL
  Filled 2020-10-04 (×9): qty 1

## 2020-10-04 MED ORDER — GUAIFENESIN ER 600 MG PO TB12
1200.0000 mg | ORAL_TABLET | Freq: Two times a day (BID) | ORAL | Status: DC
  Administered 2020-10-04 – 2020-10-07 (×7): 1200 mg via ORAL
  Filled 2020-10-04 (×7): qty 2

## 2020-10-04 NOTE — TOC Progression Note (Addendum)
Transition of Care [facility]) - Progression Note    Patient Details  Name: Demontrez Rindfleisch MRN: [id number] Date of Birth: Jul 28, 1966  Transition of Care [facility]) CM/SW Contact  Darleene Cleaver, [name] Phone Number: 10/04/2020, 12:08 PM  Clinical Narrative:    Patient was just admitted last week and discharged.  He was provided resources last week.  CSW spoke to patient on the phone, CSW will speak to patient in the morning.  Expected Discharge Plan and Services           Expected Discharge Date:  (unknown)                                     Social Determinants of Health (SDOH) Interventions    Readmission Risk Interventions Readmission Risk Prevention Plan 07/23/2020 04/06/2019 03/29/2019  Transportation Screening Complete Complete Complete  PCP or Specialist Appt within 3-5 Days Complete Complete Patient refused  HRI or Home Care Consult Complete Complete -  Social Work Consult for Recovery Care Planning/Counseling Complete Complete Complete  Palliative Care Screening Not Applicable Not Applicable Not Applicable  Medication Review Oceanographer) Complete Complete Complete  Some recent data might be hidden

## 2020-10-04 NOTE — Progress Notes (Signed)
PROGRESS NOTE    [name]  UPB:[id number] DOB: 02/28/1966 DOA: 10/02/2020 PCP: Patient, No Pcp Per  Brief Narrative:  HPI per Dr. Odie Sera on 10/02/20 HPI: [name] is a 54 y.o. male with medical history significant for chronic combined CHF, cocaine abuse, depression, and hypertension, now presenting to the emergency department for evaluation of shortness of breath and suicidal ideations.  Patient was just discharged from the hospital few days ago, has not taken any of his prescription medications since then, has become progressively dyspneic, and his been feeling hopeless and suicidal, indicating that he plans to jump out into traffic.  He denies any chest pain, fevers, or chills.  He has a cough that he describes as chronic.  ED Course: Upon arrival to the ED, patient is found to be afebrile, saturating 100% on room air, normal respiratory rate, and stable blood pressure.  EKG features sinus rhythm with LVH and prolonged QT interval.  Chest x-rays negative for acute findings.  Chemistry panel with mild elevations in creatinine and total bilirubin that appear stable.  Covid screening test not yet collected.  Troponin mildly elevated and essentially unchanged when repeated.   **Interim History  Continues to remain volume overloaded and still getting diuresed.  Psychiatry evaluating for further management they felt that the patient would benefit from residential treatment which was recommended in the past.  They feel that he does not need inpatient psychiatric care at this time.  Assessment & Plan:   Principal Problem:   Acute on chronic combined systolic and diastolic CHF (congestive heart failure) (HCC) Active Problems:   Essential hypertension   Elevated troponin   Suicidal ideation  Acute on chronic combined Systolic and Diastolic CHF  -Patient with CHF, cocaine abuse, and medication non-compliance presents with SOB  -Has a history of nonadherence to home  medications -Echo from 09/26/20 with EF 40-45% and grade II DD -BNP on Admission was elevated at 1257.1 and repeat this morning was 325.5 -Currently not requiring any supplemental oxygen via nasal cannula -Resume Lasix with IV administration initially 40 mg q12h and will continue today and stop after today and change to p.o. in the morning, fluid-restrict diet 1500 mL (Diet changed to Regular Diet from [facility]) -Continue to monitor strict I's and O's, daily weights; Patient is -1.000 Liter yesterday but I not think I's and O's are accurate as he is only -17 mL since admission and Repeat Weight is 9 pounds more than what he was -Continue to monitor electrolytes and renal function while patient is getting diuresed: BUN/creatinine slightly elevated from yesterday to today and is now 25/1.56 -Continue to monitor for signs and symptoms of volume overload and repeat a chest x-ray in a.m -Chest x-ray this morning showed "Unchanged cardiomegaly. No appreciable airspace consolidation or pulmonary edema. No evidence of pleural effusion or pneumothorax. No acute bony abnormality identified."  Hyperbilirubinemia -Patient's T Bili was 1.9 and trended down to 1.6 and is now 1.0 and normalized  -Likely Reactive -Continue to Monitor and Trend carefully -Repeat CMP in the AM  Suicidal ideation in a Hx of Schizoaffective Disorder and Bipolar 1 Disorder -Patient reports feelings of hopelessness with frequent SI, planned to jump into traffic but Psych evaluated and felt he was not really Suicidial  -Continued Suicide precautions and 1:1 sitter but removed now that he is cleared by Psych -Psychiatry recommending Residential Treatment and the patient does not need inpatient psychiatric care  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/Tobacco Abuse -Patient  was Positive for Cocaine again on UDS -Cocain and and Tobacco Counseling given for cessation  Chronic kidney disease stage IIIa -Baseline  creatinine recently has been ranging from 1.2-1.5 the last few years -Presented with a BUN/creatinine of 23/1.40 and repeat yesterday morning was 22/1.49 and this morning it was 25/1.56 -avoid further nephrotoxic medications, contrast dyes, hypotension and renally adjust medications  -Continue monitor and trend renal function and repeat CMP in a.m as he is getting diuresis with IV Lasix  Essential Hypertension -Continue IV Lasix as above, and Imdur 30 mg po Daily and Hydralazine 25 mg TID -Continue to monitor blood pressures per protocol -Last blood pressure was 136/91 -we will add IV hydralazine 10 mg every 6 hours as needed for systolic blood pressure greater than 180 or diastolic blood pressure greater than 100   Hyperlipidemia -C/w Atorvastatin 20 mg po qHS  Elevated Troponin  -HS troponin mildly elevated in ED without anginal complaints   -troponin went from 71 and is now 72 -Likely related to cocaine abuse and CHF, no further inpatient workup anticipated  -If develops chest pain will notify Cardiology  Prolonged QT interval  -Minimize QT-prolonging medications, monitor electrolytes and replace as needed   -Patient's K+ is 3.9 and magnesium is 2.2  DVT prophylaxis: Enoxaparin 40 mg sq q2h Code Status: FULL CODE Family Communication: No family present at bedside  Disposition Plan: Pending further improvement back to his dry weight; psychiatry is cleared the patient  Status is: Inpatient  Remains inpatient appropriate because:IV treatments appropriate due to intensity of illness or inability to take PO and Inpatient level of care appropriate due to severity of illness   Dispo: The patient is from: Home              Anticipated d/c is to: Home              Anticipated d/c date is: 1 day              Patient currently is not medically stable to d/c.  Consultants:   Psychiatry   Procedures: None  Antimicrobials:  Anti-infectives (From admission, onward)   None        Subjective: Seen and examined at bedside and states that he had a very rough night last night and states that he was extremely short of breath and wanted to his inhalers and feels like he is congested and become short of breath.  Tells me he is unable to lay flat.  Was very agitated overnight but calmer today.  No nausea or vomiting.  Thinks his shortness of breath is improved now but still feels congested and want something to cough it out and wants nebulizer breathing treatments.  Continues to get diuresis and is improving.  Psychiatry has cleared the patient their standpoint.  Anticipating discharging home in the a.m.  Objective: Vitals:   10/03/20 2134 10/04/20 1100 10/04/20 1130 10/04/20 1328  BP: 133/74 (!) 150/108 (!) 150/108 (!) 136/91  Pulse: 81  82 87  Resp: 18     Temp:    97.9 F (36.6 C)  TempSrc:    Oral  SpO2: 98%  98% 99%  Weight:      Height:        Intake/Output Summary (Last 24 hours) at 10/04/2020 1512 Last data filed at 10/04/2020 1300 Gross per 24 hour  Intake 963 ml  Output 700 ml  Net 263 ml   Filed Weights   10/02/20 1815 10/03/20 0800  Weight: 86.2  kg 82.5 kg   Examination: Physical Exam:  Constitutional: WN/WD overweight AAM in NAD and complaining of needing a Nebulized breathing treatment Eyes: Lids and conjunctivae normal, sclerae anicteric  ENMT: External Ears, Nose appear normal. Grossly normal hearing Neck: Appears normal, supple, no cervical masses, normal ROM, no appreciable thyromegaly MI: No appreciable JVD today Respiratory: Diminished to auscultation bilaterally with coarse breath sounds but no wheezing, rales, rhonchi or crackles. Normal respiratory effort and patient is not tachypenic. No accessory muscle use.  Unlabored breathing and not wearing supplemental oxygen via nasal cannula Cardiovascular: RRR, no murmurs / rubs / gallops. S1 and S2 auscultated.  Trace extremity edema Abdomen: Soft, non-tender, distended secondary body  habitus. Bowel sounds positive.  GU: Deferred. Musculoskeletal: No clubbing / cyanosis of digits/nails. No joint deformity upper and lower extremities Skin: Has a right toe wound but no fractures on limited skin evaluation. No induration; Warm and dry.  Neurologic: CN 2-12 grossly intact with no focal deficits. Romberg sign and cerebellar reflexes not assessed.  Psychiatric: Normal judgment and insight. Alert and oriented x 3. Normal mood and appropriate affect.   Data Reviewed: I have personally reviewed following labs and imaging studies  CBC: Recent Labs  Lab 10/02/20 1945 10/03/20 0822 10/04/20 0840  WBC 7.0 6.0 5.0  NEUTROABS 4.4 2.8 2.3  HGB 14.2 15.0 15.0  HCT 42.4 44.5 45.1  MCV 98.6 98.0 98.3  PLT 263 276 244   Basic Metabolic Panel: Recent Labs  Lab 10/02/20 1945 10/03/20 0822 10/04/20 0840  NA 138 139 139  K 3.7 4.1 3.9  CL 103 106 105  CO2 23 22 23   GLUCOSE 141* 99 171*  BUN 23* 22* 25*  CREATININE 1.40* 1.43* 1.56*  CALCIUM 8.8* 8.8* 8.7*  MG  --  2.4 2.2  PHOS  --  4.0 4.6   GFR: Estimated Creatinine Clearance: 55.4 mL/min (A) (by C-G formula based on SCr of 1.56 mg/dL (H)). Liver Function Tests: Recent Labs  Lab 10/02/20 1945 10/03/20 0822 10/04/20 0840  AST 26 26 21   ALT 23 24 22   ALKPHOS 85 84 75  BILITOT 1.9* 1.6* 1.0  PROT 6.9 6.7 6.7  ALBUMIN 3.8 3.7 3.5   No results for input(s): LIPASE, AMYLASE in the last 168 hours. No results for input(s): AMMONIA in the last 168 hours. Coagulation Profile: No results for input(s): INR, PROTIME in the last 168 hours. Cardiac Enzymes: No results for input(s): CKTOTAL, CKMB, CKMBINDEX, TROPONINI in the last 168 hours. BNP (last 3 results) No results for input(s): PROBNP in the last 8760 hours. HbA1C: No results for input(s): HGBA1C in the last 72 hours. CBG: No results for input(s): GLUCAP in the last 168 hours. Lipid Profile: No results for input(s): CHOL, HDL, LDLCALC, TRIG, CHOLHDL, LDLDIRECT  in the last 72 hours. Thyroid Function Tests: No results for input(s): TSH, T4TOTAL, FREET4, T3FREE, THYROIDAB in the last 72 hours. Anemia Panel: No results for input(s): VITAMINB12, FOLATE, FERRITIN, TIBC, IRON, RETICCTPCT in the last 72 hours. Sepsis Labs: No results for input(s): PROCALCITON, LATICACIDVEN in the last 168 hours.  Recent Results (from the past 240 hour(s))  Respiratory Panel by RT PCR (Flu A&B, Covid) - Nasopharyngeal Swab     Status: None   Collection Time: 09/25/20  7:25 AM   Specimen: Nasopharyngeal Swab  Result Value Ref Range Status   SARS Coronavirus 2 by RT PCR NEGATIVE NEGATIVE Final    Comment: (NOTE) SARS-CoV-2 target nucleic acids are NOT DETECTED.  The SARS-CoV-2  RNA is generally detectable in upper respiratoy specimens during the acute phase of infection. The lowest concentration of SARS-CoV-2 viral copies this assay can detect is 131 copies/mL. A negative result does not preclude SARS-Cov-2 infection and should not be used as the sole basis for treatment or other patient management decisions. A negative result may occur with  improper specimen collection/handling, submission of specimen other than nasopharyngeal swab, presence of viral mutation(s) within the areas targeted by this assay, and inadequate number of viral copies (<131 copies/mL). A negative result must be combined with clinical observations, patient history, and epidemiological information. The expected result is Negative.  Fact Sheet for Patients:  https://www.moore.com/  Fact Sheet for Healthcare Providers:  https://www.young.biz/  This test is no t yet approved or cleared by the [place] FDA and  has been authorized for detection and/or diagnosis of SARS-CoV-2 by FDA under an Emergency Use Authorization (EUA). This EUA will remain  in effect (meaning this test can be used) for the duration of the COVID-19 declaration under Section  564(b)(1) of the Act, 21 U.S.C. section 360bbb-3(b)(1), unless the authorization is terminated or revoked sooner.     Influenza A by PCR NEGATIVE NEGATIVE Final   Influenza B by PCR NEGATIVE NEGATIVE Final    Comment: (NOTE) The Xpert Xpress SARS-CoV-2/FLU/RSV assay is intended as an aid in  the diagnosis of influenza from Nasopharyngeal swab specimens and  should not be used as a sole basis for treatment. Nasal washings and  aspirates are unacceptable for Xpert Xpress SARS-CoV-2/FLU/RSV  testing.  Fact Sheet for Patients: https://www.moore.com/  Fact Sheet for Healthcare Providers: https://www.young.biz/  This test is not yet approved or cleared by the [place] FDA and  has been authorized for detection and/or diagnosis of SARS-CoV-2 by  FDA under an Emergency Use Authorization (EUA). This EUA will remain  in effect (meaning this test can be used) for the duration of the  Covid-19 declaration under Section 564(b)(1) of the Act, 21  U.S.C. section 360bbb-3(b)(1), unless the authorization is  terminated or revoked. Performed at [facility], [address], [place], [name] [address]   Resp Panel by RT PCR (RSV, Flu A&B, Covid) - Nasopharyngeal Swab     Status: None   Collection Time: 10/02/20  9:32 PM   Specimen: Nasopharyngeal Swab  Result Value Ref Range Status   SARS Coronavirus 2 by RT PCR NEGATIVE NEGATIVE Final    Comment: (NOTE) SARS-CoV-2 target nucleic acids are NOT DETECTED.  The SARS-CoV-2 RNA is generally detectable in upper respiratoy specimens during the acute phase of infection. The lowest concentration of SARS-CoV-2 viral copies this assay can detect is 131 copies/mL. A negative result does not preclude SARS-Cov-2 infection and should not be used as the sole basis for treatment or other patient management decisions. A negative result may occur with  improper specimen collection/handling,  submission of specimen other than nasopharyngeal swab, presence of viral mutation(s) within the areas targeted by this assay, and inadequate number of viral copies (<131 copies/mL). A negative result must be combined with clinical observations, patient history, and epidemiological information. The expected result is Negative.  Fact Sheet for Patients:  https://www.moore.com/  Fact Sheet for Healthcare Providers:  https://www.young.biz/  This test is no t yet approved or cleared by the [place] FDA and  has been authorized for detection and/or diagnosis of SARS-CoV-2 by FDA under an Emergency Use Authorization (EUA). This EUA will remain  in effect (meaning this test can be used)  for the duration of the COVID-19 declaration under Section 564(b)(1) of the Act, 21 U.S.C. section 360bbb-3(b)(1), unless the authorization is terminated or revoked sooner.     Influenza A by PCR NEGATIVE NEGATIVE Final   Influenza B by PCR NEGATIVE NEGATIVE Final    Comment: (NOTE) The Xpert Xpress SARS-CoV-2/FLU/RSV assay is intended as an aid in  the diagnosis of influenza from Nasopharyngeal swab specimens and  should not be used as a sole basis for treatment. Nasal washings and  aspirates are unacceptable for Xpert Xpress SARS-CoV-2/FLU/RSV  testing.  Fact Sheet for Patients: https://www.moore.com/  Fact Sheet for Healthcare Providers: https://www.young.biz/  This test is not yet approved or cleared by the [place] FDA and  has been authorized for detection and/or diagnosis of SARS-CoV-2 by  FDA under an Emergency Use Authorization (EUA). This EUA will remain  in effect (meaning this test can be used) for the duration of the  Covid-19 declaration under Section 564(b)(1) of the Act, 21  U.S.C. section 360bbb-3(b)(1), unless the authorization is  terminated or revoked.    Respiratory Syncytial Virus by PCR  NEGATIVE NEGATIVE Final    Comment: (NOTE) Fact Sheet for Patients: https://www.moore.com/  Fact Sheet for Healthcare Providers: https://www.young.biz/  This test is not yet approved or cleared by the [place] FDA and  has been authorized for detection and/or diagnosis of SARS-CoV-2 by  FDA under an Emergency Use Authorization (EUA). This EUA will remain  in effect (meaning this test can be used) for the duration of the  COVID-19 declaration under Section 564(b)(1) of the Act, 21 U.S.C.  section 360bbb-3(b)(1), unless the authorization is terminated or  revoked. Performed at [facility], [address], [place], [name] [address]     RN Pressure Injury Documentation:     Estimated body mass index is 26.86 kg/m as calculated from the following:   Height as of this encounter: 5\' 9"  (1.753 m).   Weight as of this encounter: 82.5 kg.  Malnutrition Type:      Malnutrition Characteristics:      Nutrition Interventions:    Radiology Studies: DG CHEST PORT 1 VIEW  Result Date: 10/04/2020 CLINICAL DATA:  Dyspnea. EXAM: PORTABLE CHEST 1 VIEW COMPARISON:  Prior chest radiograph 10/02/2020. FINDINGS: Unchanged cardiomegaly. No appreciable airspace consolidation or pulmonary edema. No evidence of pleural effusion or pneumothorax. No acute bony abnormality identified. IMPRESSION: Cardiomegaly.  No evidence of acute cardiopulmonary abnormality. Electronically Signed   By: 13/01/2020 DO   On: 10/04/2020 08:37   DG Chest Portable 1 View  Result Date: 10/02/2020 CLINICAL DATA:  Shortness of breath EXAM: PORTABLE CHEST 1 VIEW COMPARISON:  09/25/2020 FINDINGS: Cardiomegaly. No confluent opacities, effusions or edema. No acute bony abnormality. IMPRESSION: Cardiomegaly.  No active disease. Electronically Signed   By: 09/27/2020 M.D.   On: 10/02/2020 19:01   Scheduled Meds: . atorvastatin  20 mg Oral Daily  . enoxaparin  (LOVENOX) injection  40 mg Subcutaneous Q24H  . furosemide  40 mg Intravenous Q12H  . gabapentin  200 mg Oral BID  . guaiFENesin  1,200 mg Oral BID  . hydrALAZINE  25 mg Oral Q8H  . isosorbide mononitrate  30 mg Oral Daily  . lidocaine  1 patch Transdermal Q24H  . neomycin-bacitracin-polymyxin   Topical BID  . sodium chloride flush  3 mL Intravenous Q12H  . sodium chloride flush  3 mL Intravenous Q12H   Continuous Infusions: . sodium chloride  LOS: 1 day   Kerney Elbe, DO [facility] PAGER is on AMION  If 7PM-7AM, please contact night-coverage [email or web address]

## 2020-10-05 DIAGNOSIS — E782 Mixed hyperlipidemia: Secondary | ICD-10-CM

## 2020-10-05 DIAGNOSIS — I2510 Atherosclerotic heart disease of native coronary artery without angina pectoris: Secondary | ICD-10-CM

## 2020-10-05 DIAGNOSIS — R0600 Dyspnea, unspecified: Secondary | ICD-10-CM

## 2020-10-05 LAB — COMPREHENSIVE METABOLIC PANEL
ALT: 17 U/L (ref 0–44)
AST: 15 U/L (ref 15–41)
Albumin: 3.4 g/dL — ABNORMAL LOW (ref 3.5–5.0)
Alkaline Phosphatase: 65 U/L (ref 38–126)
Anion gap: 15 (ref 5–15)
BUN: 25 mg/dL — ABNORMAL HIGH (ref 6–20)
CO2: 19 mmol/L — ABNORMAL LOW (ref 22–32)
Calcium: 8.6 mg/dL — ABNORMAL LOW (ref 8.9–10.3)
Chloride: 102 mmol/L (ref 98–111)
Creatinine, Ser: 1.59 mg/dL — ABNORMAL HIGH (ref 0.61–1.24)
GFR, Estimated: 52 mL/min — ABNORMAL LOW (ref >60)
Glucose, Bld: 111 mg/dL — ABNORMAL HIGH (ref 70–99)
Potassium: 3.8 mmol/L (ref 3.5–5.1)
Sodium: 136 mmol/L (ref 135–145)
Total Bilirubin: 0.8 mg/dL (ref 0.3–1.2)
Total Protein: 6.3 g/dL — ABNORMAL LOW (ref 6.5–8.1)

## 2020-10-05 LAB — CBC WITH DIFFERENTIAL/PLATELET
Abs Immature Granulocytes: 0.01 10*3/uL (ref 0.00–0.07)
Basophils Absolute: 0 10*3/uL (ref 0.0–0.1)
Basophils Relative: 1 %
Eosinophils Absolute: 0.1 10*3/uL (ref 0.0–0.5)
Eosinophils Relative: 2 %
HCT: 44.3 % (ref 39.0–52.0)
Hemoglobin: 14.6 g/dL (ref 13.0–17.0)
Immature Granulocytes: 0 %
Lymphocytes Relative: 41 %
Lymphs Abs: 2.5 10*3/uL (ref 0.7–4.0)
MCH: 33 pg (ref 26.0–34.0)
MCHC: 33 g/dL (ref 30.0–36.0)
MCV: 100.2 fL — ABNORMAL HIGH (ref 80.0–100.0)
Monocytes Absolute: 0.8 10*3/uL (ref 0.1–1.0)
Monocytes Relative: 12 %
Neutro Abs: 2.8 10*3/uL (ref 1.7–7.7)
Neutrophils Relative %: 44 %
Platelets: 244 10*3/uL (ref 150–400)
RBC: 4.42 MIL/uL (ref 4.22–5.81)
RDW: 15.4 % (ref 11.5–15.5)
WBC: 6.2 10*3/uL (ref 4.0–10.5)
nRBC: 0 % (ref 0.0–0.2)

## 2020-10-05 LAB — PHOSPHORUS: Phosphorus: 4.1 mg/dL (ref 2.5–4.6)

## 2020-10-05 LAB — BRAIN NATRIURETIC PEPTIDE: B Natriuretic Peptide: 356.6 pg/mL — ABNORMAL HIGH (ref 0.0–100.0)

## 2020-10-05 LAB — MAGNESIUM: Magnesium: 2 mg/dL (ref 1.7–2.4)

## 2020-10-05 MED ORDER — ALBUTEROL SULFATE (2.5 MG/3ML) 0.083% IN NEBU: 2.5000 mg | INHALATION_SOLUTION | Freq: Four times a day (QID) | RESPIRATORY_TRACT | Status: DC | PRN

## 2020-10-05 MED ORDER — CLOPIDOGREL BISULFATE 75 MG PO TABS
75.0000 mg | ORAL_TABLET | Freq: Every day | ORAL | Status: DC
  Administered 2020-10-05 – 2020-10-06 (×2): 75 mg via ORAL
  Filled 2020-10-05 (×3): qty 1

## 2020-10-05 MED ORDER — HYDRALAZINE HCL 50 MG PO TABS
50.0000 mg | ORAL_TABLET | Freq: Three times a day (TID) | ORAL | Status: DC
  Administered 2020-10-05 – 2020-10-07 (×5): 50 mg via ORAL
  Filled 2020-10-05 (×5): qty 1

## 2020-10-05 MED ORDER — IPRATROPIUM BROMIDE 0.02 % IN SOLN: 0.5000 mg | Freq: Four times a day (QID) | RESPIRATORY_TRACT | Status: DC | PRN

## 2020-10-05 MED ORDER — LEVALBUTEROL HCL 0.63 MG/3ML IN NEBU: 0.6300 mg | INHALATION_SOLUTION | Freq: Four times a day (QID) | RESPIRATORY_TRACT | Status: DC | PRN

## 2020-10-05 MED ORDER — FUROSEMIDE 40 MG PO TABS
40.0000 mg | ORAL_TABLET | Freq: Every day | ORAL | Status: DC
  Administered 2020-10-06 – 2020-10-07 (×2): 40 mg via ORAL
  Filled 2020-10-05 (×2): qty 1

## 2020-10-05 MED ORDER — FUROSEMIDE 10 MG/ML IJ SOLN
40.0000 mg | Freq: Two times a day (BID) | INTRAMUSCULAR | Status: DC
  Administered 2020-10-05: 40 mg via INTRAVENOUS
  Filled 2020-10-05: qty 4

## 2020-10-05 MED ORDER — IPRATROPIUM-ALBUTEROL 0.5-2.5 (3) MG/3ML IN SOLN
3.0000 mL | Freq: Once | RESPIRATORY_TRACT | Status: AC
  Administered 2020-10-05: 3 mL via RESPIRATORY_TRACT
  Filled 2020-10-05: qty 3

## 2020-10-05 NOTE — Progress Notes (Signed)
Patient ID: [name], male   DOB: 1966/05/03, 54 y.o.   MRN: [id number] Abdias Hickam is a 54 year old male with medical history significant for chronic combined CHF, cocaine abuse, depression, and hypertension who presented to the emergency room with complaints of shortness of breath and suicidal ideations.  Psychiatric consult placed per patient request to be reevaluated.  Prior to evaluate inpatient this nurse practitioner received a call from [place] at [facility] behavioral health urgent care stating " this patient called the crisis line seeking help.  He is in a bad place, has a history of overdose on pills.  And because he was in a bad place he was not sure if he would attempt again."  She advised patient that he is a resident of [facility], and would not qualify for services in [place].  She advised patient that he can reach out to [facility] in which he stated " I do not like [facility].  I can go back there."  She did advise patient that because he was on the medical floor she could request a psych consult, and patient agreed.   Upon entering the room patient appeared to be resting, awoken to his name being called multiple times.  Writer introduced self, and reason for coming.  It was at that time that patient advised " I did not want to speak to you.  I need to speak to a psychiatrist.  I am sorry you had to waste your time.  I realize you ask you to come, but I need to get this taken care of and only a psychiatrist can help me."  Writer verbalized understanding.  Patient then rolled over and closed his eyes.  This was communicated to patient's nurse Alderton Sink.  Per chart review patient appears to be requesting assistance with residential services.  Patient is well-known to our services, he has sought care in the emergency room approximately 15 times in which 5 resulted in a medical admission within the last 3 months.  He continues to not meet inpatient criteria.  He has been assessed on  several occasions, including less than 2 days ago and deemed to be safe for discharge.  Patient received residential and substance abuse resources on discharge, less than 1 week ago.  Patient can follow-up with those facilities, as patient has previously declined such referrals and has shown failure to follow-up with appropriate resources.  He was previously accepted to [facility] at [facility] in which he also declines returning to at this time.  Psychiatry to sign off at this time.

## 2020-10-05 NOTE — Progress Notes (Signed)
Report received from [facility], RN.  Assessment unchanged. Nino Parsley

## 2020-10-05 NOTE — Consult Note (Addendum)
Cardiology Consultation:   Patient ID: [name] MRN: [id number]; DOB: [birth year]  Admit date: 10/02/2020 Date of Consult: 10/05/2020  Primary Care Provider: Patient, No Pcp Per [facility] Cardiologist: Little Ishikawa, MD  [facility] Electrophysiologist:  None    Patient Profile:   [name] is a 54 y.o. male with a PMH of CAD, chronic combined CHF, HTN, HLD, COPD, asthma, schizoaffective disorder, cocaine abuse, and tobacco abuse, who is being seen today for the evaluation of CHF at the request of Dr. Marland Mcalpine.  History of Present Illness:   [name] was admitted to the hospital 10/02/20 after presenting with SOB and suicidal ideations. BNP was elevated to 1200s. CXR was without acute findings. He was admitted to medicine for acute on chronic combined CHF exacerbation. He was started on IV lasix 40mg  BID. Unclear if I&Os are accurate, though UOP is -2.6L today. Weight has been labile: 190lbs on admission, down to 181lbs 11/3, then back up to 190lbs 11/5. BNP downtrended to 300s. Cardiology asked to evaluate for CHF.  Reports he continues to have dyspnea.  He was last evaluated by cardiology during an admission to the hospital 07/2020 for management of his CAD and CHF. He had a prior coronary CTA 03/2020 which revealed moderate to severe RCA, LAD and Cx disease - he was recommended for functional assessment, however left AMA prior to this evaluation. He was recommended to undergo a NST that admission, however was wheezing on exam, therefore NST deferred to outpatient setting. He did not complete this study. Felt to be a poor candidate for invasive ischemic testing due to non-compliance and cocaine abuse. His last echocardiogram prior to this admission was 09/26/2020 which showed EF 40-45% (up from 15% 03/2019), mild concentric LVH, G2DD, no RWMA, moderate LAE, mild RAE, and trivial MR.   Past Medical History:  Diagnosis Date   Asthma    Bipolar 1 disorder (HCC)     CHF (congestive heart failure) (HCC)    Depression    Homelessness    Hypertension    QT prolongation    Schizoaffective disorder (HCC)     History reviewed. No pertinent surgical history.   Home Medications:  Prior to Admission medications   Medication Sig Start Date End Date Taking? Authorizing Provider  albuterol (VENTOLIN HFA) 108 (90 Base) MCG/ACT inhaler Inhale 2 puffs into the lungs every 6 (six) hours as needed for wheezing or shortness of breath. 09/26/20  Yes Gherghe, 09/28/20, MD  atorvastatin (LIPITOR) 20 MG tablet Take 1 tablet (20 mg total) by mouth daily. Patient not taking: Reported on 10/02/2020 09/26/20   09/28/20, MD  furosemide (LASIX) 40 MG tablet Take 1 tablet (40 mg total) by mouth daily. Patient not taking: Reported on 10/02/2020 09/26/20 12/25/20  12/27/20, MD  hydrALAZINE (APRESOLINE) 25 MG tablet Take 1 tablet (25 mg total) by mouth every 8 (eight) hours. Patient not taking: Reported on 10/02/2020 09/26/20 12/25/20  12/27/20, MD  isosorbide mononitrate (IMDUR) 30 MG 24 hr tablet Take 1 tablet (30 mg total) by mouth daily. Patient not taking: Reported on 10/02/2020 09/26/20 12/25/20  12/27/20, MD  neomycin-bacitracin-polymyxin (NEOSPORIN) ointment Apply topically 2 (two) times daily. Patient not taking: Reported on 10/02/2020 09/26/20   09/28/20, MD    Inpatient Medications: Scheduled Meds:  atorvastatin  20 mg Oral Daily   enoxaparin (LOVENOX) injection  40 mg Subcutaneous Q24H   [START ON 10/06/2020] furosemide  40 mg Oral Daily  gabapentin  200 mg Oral BID   guaiFENesin  1,200 mg Oral BID   hydrALAZINE  50 mg Oral Q8H   isosorbide mononitrate  30 mg Oral Daily   lidocaine  1 patch Transdermal Q24H   neomycin-bacitracin-polymyxin   Topical BID   sodium chloride flush  3 mL Intravenous Q12H   sodium chloride flush  3 mL Intravenous Q12H   Continuous Infusions:  sodium chloride     PRN  Meds: sodium chloride, albuterol, hydrALAZINE, ipratropium, levalbuterol, oxyCODONE, senna-docusate, sodium chloride flush  Allergies:    Allergies  Allergen Reactions   Ibuprofen Anaphylaxis   Acetaminophen Hives, Nausea And Vomiting and Rash    Pt overdosed on bottle of APAP 05/06/16     Social History:   Social History   Socioeconomic History   Marital status: Single    Spouse name: Not on file   Number of children: Not on file   Years of education: Not on file   Highest education level: Not on file  Occupational History   Not on file  Tobacco Use   Smoking status: Current Every Day Smoker    Years: 30.00    Types: Cigarettes   Smokeless tobacco: Never Used  Building services engineer Use: Never used  Substance and Sexual Activity   Alcohol use: Yes    Comment: infrequently   Drug use: Yes    Types: Cocaine, Marijuana   Sexual activity: Yes    Birth control/protection: None  Other Topics Concern   Not on file  Social History Narrative   Not on file   Social Determinants of Health   Financial Resource Strain:    Difficulty of Paying Living Expenses: Not on file  Food Insecurity:    Worried About Programme researcher, broadcasting/film/video in the Last Year: Not on file   [employer] of Food in the Last Year: Not on file  Transportation Needs:    Lack of Transportation (Medical): Not on file   Lack of Transportation (Non-Medical): Not on file  Physical Activity:    Days of Exercise per Week: Not on file   Minutes of Exercise per Session: Not on file  Stress:    Feeling of Stress : Not on file  Social Connections:    Frequency of Communication with Friends and Family: Not on file   Frequency of Social Gatherings with Friends and Family: Not on file   Attends Religious Services: Not on file   Active Member of Clubs or Organizations: Not on file   Attends Banker Meetings: Not on file   Marital Status: Not on file  Intimate Partner Violence:    Fear  of Current or Ex-Partner: Not on file   Emotionally Abused: Not on file   Physically Abused: Not on file   Sexually Abused: Not on file    Family History:    Family History  Problem Relation Age of Onset   Drug abuse Mother    Suicidality Mother      ROS:  Please see the history of present illness.   All other ROS reviewed and negative.     Physical Exam/Data:   Vitals:   10/05/20 0500 10/05/20 0934 10/05/20 1045 10/05/20 1321  BP:  (!) 171/113  (!) 133/107  Pulse:  85  73  Resp:    18  Temp:    (!) 97.4 F (36.3 C)  TempSrc:    Oral  SpO2:   99% 100%  Weight: 86.2 kg  Height:        Intake/Output Summary (Last 24 hours) at 10/05/2020 1935 Last data filed at 10/05/2020 1800 Gross per 24 hour  Intake 3 ml  Output 3250 ml  Net -3247 ml   Last 3 Weights 10/05/2020 10/03/2020 10/02/2020  Weight (lbs) 190 lb 0.6 oz 181 lb 14.1 oz 190 lb  Weight (kg) 86.2 kg 82.5 kg 86.183 kg  Some encounter information is confidential and restricted. Go to Review Flowsheets activity to see all data.     Body mass index is 28.06 kg/m.  General:  in no acute distress HEENT: normal Neck: no JVD Cardiac:  normal S1, S2; RRR; no murmur Lungs:  clear to auscultation bilaterally, no wheezing, rhonchi or rales  Abd: soft, nontender Ext: no edema Musculoskeletal:  No deformities Skin: warm and dry  Neuro:  no focal abnormalities noted Psych:  Normal affect   EKG:  The EKG was personally reviewed and demonstrates:  EKG 10/02/20 showed sinus rhythm with rate 88bpm, LVH pattern, STD in V4-6, non-specific ST-T wave abnormalities, Qtc 535; STD more pronounced than previous EKGs Telemetry:   Not on telemetry  Relevant CV Studies: Echocardiogram 09/26/20: 1. Left ventricular ejection fraction, by estimation, is 40 to 45%. The  left ventricle has mildly decreased function. The left ventricle  demonstrates global hypokinesis. The left ventricular internal cavity size  was mildly dilated.  There is mild  concentric left ventricular hypertrophy. Left ventricular diastolic  parameters are consistent with Grade II diastolic dysfunction  (pseudonormalization).  2. Right ventricular systolic function is normal. The right ventricular  size is normal.  3. Left atrial size was moderately dilated.  4. Right atrial size was mildly dilated.  5. The mitral valve is normal in structure. Trivial mitral valve  regurgitation. No evidence of mitral stenosis.  6. The aortic valve is tricuspid. There is mild calcification of the  aortic valve. Aortic valve regurgitation is not visualized. Mild aortic  valve sclerosis is present, with no evidence of aortic valve stenosis.  7. The inferior vena cava is normal in size with greater than 50%  respiratory variability, suggesting right atrial pressure of 3 mmHg.   Laboratory Data:  High Sensitivity Troponin:   Recent Labs  Lab 09/25/20 1149 09/25/20 1524 10/02/20 1945 10/02/20 2100  TROPONINIHS 31* 28* 71* 69*     Chemistry Recent Labs  Lab 10/03/20 0822 10/04/20 0840 10/05/20 0515  NA 139 139 136  K 4.1 3.9 3.8  CL 106 105 102  CO2 22 23 19*  GLUCOSE 99 171* 111*  BUN 22* 25* 25*  CREATININE 1.43* 1.56* 1.59*  CALCIUM 8.8* 8.7* 8.6*  GFRNONAA 59* 53* 52*  ANIONGAP 11 11 15     Recent Labs  Lab 10/03/20 0822 10/04/20 0840 10/05/20 0515  PROT 6.7 6.7 6.3*  ALBUMIN 3.7 3.5 3.4*  AST 26 21 15   ALT 24 22 17   ALKPHOS 84 75 65  BILITOT 1.6* 1.0 0.8   Hematology Recent Labs  Lab 10/03/20 0822 10/04/20 0840 10/05/20 0515  WBC 6.0 5.0 6.2  RBC 4.54 4.59 4.42  HGB 15.0 15.0 14.6  HCT 44.5 45.1 44.3  MCV 98.0 98.3 100.2*  MCH 33.0 32.7 33.0  MCHC 33.7 33.3 33.0  RDW 15.5 15.4 15.4  PLT 276 244 244   BNP Recent Labs  Lab 10/02/20 1945 10/04/20 0840 10/05/20 0515  BNP 1,257.1* 325.5* 356.6*    DDimer No results for input(s): DDIMER in the last 168 hours.   Radiology/Studies:  DG CHEST PORT 1  VIEW  Result Date: 10/04/2020 CLINICAL DATA:  Dyspnea. EXAM: PORTABLE CHEST 1 VIEW COMPARISON:  Prior chest radiograph 10/02/2020. FINDINGS: Unchanged cardiomegaly. No appreciable airspace consolidation or pulmonary edema. No evidence of pleural effusion or pneumothorax. No acute bony abnormality identified. IMPRESSION: Cardiomegaly.  No evidence of acute cardiopulmonary abnormality. Electronically Signed   By: Jackey Loge DO   On: 10/04/2020 08:37   DG Chest Portable 1 View  Result Date: 10/02/2020 CLINICAL DATA:  Shortness of breath EXAM: PORTABLE CHEST 1 VIEW COMPARISON:  09/25/2020 FINDINGS: Cardiomegaly. No confluent opacities, effusions or edema. No acute bony abnormality. IMPRESSION: Cardiomegaly.  No active disease. Electronically Signed   By: Charlett Nose M.D.   On: 10/02/2020 19:01     Assessment and Plan:   1. Acute on chronic combined CHF: patient presented with SOB. CXR unremarkable. BNP elevated to 1200s, trended down to 300s with IV lasix 40mg  BID. I&Os are inaccurate, though UOP is -2.6L today.  Weights are labile - 190lbs on admission, down to 181lbs 11/3, then up again to 190lbs today. Echo 09/26/20 showed EF 40-45% with G2DD. Prior echo 8/20201 showed EF 40-45%, up from 15% 03/2019. GDMT has been limited by compliance, cocaine abuse, and CKD. Cardiology asked to see today for refractory dyspnea despite adequate diuresis.  - Appears euvolemic on exam, and Cr trending up.  Will switch to PO lasix tomorrow - Continue hydralazine and imdur  2. Moderate-severe multivessel CAD: noted on coronary CTA 03/2020. Possible his dyspnea is related to angina. He remains a poor candidate for invasive ischemic evaluation given non-compliance and ongoing cocaine use. Reports anaphylaxis to ibuprofen  - Given reported anaphylaxis to NSAIDs, will avoid aspirin and start plavix given extent of his CAD - Continue statin  3. HTN: BP is generally above goal - Will increase hydralazine to 50mg  TID -  Continue imdur  4. HLD: LDL 57, Triglycerides 200 01/9029 - Continue statin    5. COPD: possible this is contributing to his dyspnea. Nebs added today - Continue management per primary team  6. Suicidal ideations in patient with schizoaffective disorder: patient presented with SI stating he was going to jump in front of traffic. Psych NP went to evaluate patient today, though he refused to talk to her. She noted he did not meet inpatient criteria and was given outpatient resources.  - Continue management per primary team.   7. QT prolongation: maintain K.4, Mag>2.  Will recheck EKG to monitor   For questions or updates, please contact [facility] Please consult [email or web address] for contact info under    Signed, Little Ishikawa, MD  10/05/2020 7:35 PM  [name] Pimple, PA

## 2020-10-05 NOTE — Progress Notes (Signed)
PROGRESS NOTE    [name]  WLN:[id number] DOB: 1966-01-01 DOA: 10/02/2020 PCP: Patient, No Pcp Per  Brief Narrative:  HPI per Dr. Odie Sera on 10/02/20 HPI: [name] is a 54 y.o. male with medical history significant for chronic combined CHF, cocaine abuse, depression, and hypertension, now presenting to the emergency department for evaluation of shortness of breath and suicidal ideations.  Patient was just discharged from the hospital few days ago, has not taken any of his prescription medications since then, has become progressively dyspneic, and his been feeling hopeless and suicidal, indicating that he plans to jump out into traffic.  He denies any chest pain, fevers, or chills.  He has a cough that he describes as chronic.  ED Course: Upon arrival to the ED, patient is found to be afebrile, saturating 100% on room air, normal respiratory rate, and stable blood pressure.  EKG features sinus rhythm with LVH and prolonged QT interval.  Chest x-rays negative for acute findings.  Chemistry panel with mild elevations in creatinine and total bilirubin that appear stable.  Covid screening test not yet collected.  Troponin mildly elevated and essentially unchanged when repeated.   **Interim History  Continues to remain volume overloaded and still getting diuresed.  Psychiatry evaluating for further management they felt that the patient would benefit from residential treatment which was recommended in the past.  They feel that he does not need inpatient psychiatric care at this time.  10/05/2020: Still feels dyspneic today and had minimal wheezing but saturating well without supplemental nasal cannula.  Wanting to be transferred to [facility].  Does not feel like he is improving so cardiology was consulted for further evaluation recommendations.  States that he wants to speak to a psychiatrist again today were also reconsulted.  Assessment & Plan:   Principal Problem:   Acute on chronic  combined systolic and diastolic CHF (congestive heart failure) (HCC) Active Problems:   Essential hypertension   Elevated troponin   Suicidal ideation  Acute on chronic combined Systolic and Diastolic CHF  -Patient with CHF, cocaine abuse, and medication non-compliance presents with SOB  -Has a history of nonadherence to home medications -Echo from 09/26/20 with EF 40-45% and grade II DD -BNP on Admission was elevated at 1257.1 and repeat this morning was 356.6 -Currently not requiring any supplemental oxygen via nasal cannula -Resume Lasix with IV administration initially 40 mg q12h and will continue today and stop after today and change to p.o. in the morning, fluid-restrict diet 1500 mL (Diet changed to Regular Diet from [facility]) -Continue to monitor strict I's and O's, daily weights; Patient is -3.014 L and Repeat Weight is essentially the same as admission -Continue to monitor electrolytes and renal function while patient is getting diuresed: BUN/creatinine slightly elevated from yesterday to today and is now 25/1.56 and slightly further worsened to 25/1.59 -Continue to monitor for signs and symptoms of volume overload and repeat a chest x-ray in a.m -Chest x-ray yesterday morning showed "Unchanged cardiomegaly. No appreciable airspace consolidation or pulmonary edema. No evidence of pleural effusion or pneumothorax. No acute bony abnormality identified." -Added as needed duo nebs for breathing given his slight wheezing -Because the patient still feels dyspneic and is appropriately being diuresed we will consult cardiology for further evaluation recommendations  Hyperbilirubinemia -Patient's T Bili was 1.9 and trended down to 0.8 and now normalized -Likely Reactive -Continue to Monitor and Trend carefully -Repeat CMP in the AM  Suicidal ideation in a Hx of Schizoaffective  Disorder and Bipolar 1 Disorder -Patient reports feelings of hopelessness with frequent SI, planned to  jump into traffic but Psych evaluated and felt he was not really Suicidial  -Continued Suicide precautions and 1:1 sitter but removed now that he is cleared by Psych -Psychiatry recommending Residential Treatment and the patient does not need inpatient psychiatric care -Patient wanted to speak with psychiatry again so they were reconsulted today  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/Tobacco Abuse -Patient was Positive for Cocaine again on UDS -Cocain and and Tobacco Counseling given for cessation  Chronic kidney disease stage IIIa Metabolic Acidosis -Baseline creatinine recently has been ranging from 1.2-1.5 the last few years -Presented with a BUN/creatinine of 23/1.40 and is now slightly worse with diuresis at 25/1.59 -Has a slight metabolic acidosis with a CO2 of 19, anion gap of 15, chloride level of 102 -avoid further nephrotoxic medications, contrast dyes, hypotension and renally adjust medications  -Continue monitor and trend renal function and repeat CMP in a.m as he is getting diuresis with IV Lasix  Essential Hypertension -Continue IV Lasix as above, and Imdur 30 mg po Daily and Hydralazine 25 mg TID -Continue to monitor blood pressures per protocol -Last blood pressure was 133/107 -we will add IV hydralazine 10 mg every 6 hours as needed for systolic blood pressure greater than 180 or diastolic blood pressure greater than 100   Hyperlipidemia -C/w Atorvastatin 20 mg po qHS  Elevated Troponin  -HS troponin mildly elevated in ED without anginal complaints   -troponin went from 71 and is now 88 -Likely related to cocaine abuse and CHF, no further inpatient workup anticipated  -Continues to be dyspneic so cardiology was consulted for further evaluation recommendations  Prolonged QT interval  -Minimize QT-prolonging medications, monitor electrolytes and replace as needed   -Patient's K+ is 3.8 and magnesium is 2.0  Hyperglycemia in the setting of  Pre-Diabetes -Patient blood sugar has been ranging from 99-171 on BMP/CMP  -Hemoglobin A1c was 5.9 almost a month ago -Continue to monitor blood sugars carefully and if necessary will place on sensitive NovoLog/scale insulin AC  DVT prophylaxis: Enoxaparin 40 mg sq q2h Code Status: FULL CODE Family Communication: No family present at bedside  Disposition Plan: Pending further improvement back to his dry weight; psychiatry is cleared the patient  Status is: Inpatient  Remains inpatient appropriate because:IV treatments appropriate due to intensity of illness or inability to take PO and Inpatient level of care appropriate due to severity of illness   Dispo: The patient is from: Home              Anticipated d/c is to: Home              Anticipated d/c date is: 1 day              Patient currently is not medically stable to d/c.  Consultants:   Psychiatry  Cardiology   Procedures: None  Antimicrobials:  Anti-infectives (From admission, onward)   None       Subjective: Seen and examined at bedside and states had another rough night and states that he is remaining shortness of breath despite being normal oxygen saturations.  He states that he is wheezing now and wants breathing treatment and transfer to [facility] to see the heart specialist.  He keeps getting hospitalized I spoke to him about being compliant with his medications and he states "I am doing my part."  Denies any nausea or vomiting but states  he cannot lay flat.  States his toes and legs are still swollen.  Objective: Vitals:   10/05/20 0500 10/05/20 0934 10/05/20 1045 10/05/20 1321  BP:  (!) 171/113  (!) 133/107  Pulse:  85  73  Resp:    18  Temp:    (!) 97.4 F (36.3 C)  TempSrc:    Oral  SpO2:   99% 100%  Weight: 86.2 kg     Height:        Intake/Output Summary (Last 24 hours) at 10/05/2020 1605 Last data filed at 10/05/2020 1321 Gross per 24 hour  Intake 3 ml  Output 3000 ml  Net -2997 ml   Filed  Weights   10/02/20 1815 10/03/20 0800 10/05/20 0500  Weight: 86.2 kg 82.5 kg 86.2 kg   Examination: Physical Exam:  Constitutional: WN/WD overweight AAM in NAD and appears calm and comfortable Eyes: Lids and conjunctivae normal, sclerae anicteric  ENMT: External Ears, Nose appear normal. Grossly normal hearing. Neck: Appears normal, supple, no cervical masses, normal ROM, no appreciable thyromegaly no appreciable JVD Respiratory: Diminished to auscultation bilaterally with coarse breath sounds and minimal wheezhing; Slightly tachypenic. No accessory muscle use. Not wearing any supplemental O2 via [place] Cardiovascular: RRR, no murmurs / rubs / gallops. S1 and S2 auscultated. Has 1+ LE edema Abdomen: Soft, non-tender, Distended 2/2 body habitus.  Bowel sounds positive.  GU: Deferred. Musculoskeletal: No clubbing / cyanosis of digits/nails. No joint deformity upper and lower extremities. Skin: Has a Right Toe Lesion. No induration; Warm and dry.  Neurologic: CN 2-12 grossly intact with no focal deficits. Romberg sign and cerebellar reflexes not assessed.  Psychiatric: Normal judgment and insight. Alert and oriented x 3. Normal mood and appropriate affect.   Data Reviewed: I have personally reviewed following labs and imaging studies  CBC: Recent Labs  Lab 10/02/20 1945 10/03/20 0822 10/04/20 0840 10/05/20 0515  WBC 7.0 6.0 5.0 6.2  NEUTROABS 4.4 2.8 2.3 2.8  HGB 14.2 15.0 15.0 14.6  HCT 42.4 44.5 45.1 44.3  MCV 98.6 98.0 98.3 100.2*  PLT 263 276 244 244   Basic Metabolic Panel: Recent Labs  Lab 10/02/20 1945 10/03/20 0822 10/04/20 0840 10/05/20 0515  NA 138 139 139 136  K 3.7 4.1 3.9 3.8  CL 103 106 105 102  CO2 19*  GLUCOSE 141* 99 171* 111*  BUN 23* 22* 25* 25*  CREATININE 1.40* 1.43* 1.56* 1.59*  CALCIUM 8.8* 8.8* 8.7* 8.6*  MG  --  2.4 2.2 2.0  PHOS  --  4.0 4.6 4.1   GFR: Estimated Creatinine Clearance: 59.1 mL/min (A) (by C-G formula based on SCr of  1.59 mg/dL (H)). Liver Function Tests: Recent Labs  Lab 10/02/20 1945 10/03/20 0822 10/04/20 0840 10/05/20 0515  AST ALT ALKPHOS 85 84 75 65  BILITOT 1.9* 1.6* 1.0 0.8  PROT 6.9 6.7 6.7 6.3*  ALBUMIN 3.8 3.7 3.5 3.4*   No results for input(s): LIPASE, AMYLASE in the last 168 hours. No results for input(s): AMMONIA in the last 168 hours. Coagulation Profile: No results for input(s): INR, PROTIME in the last 168 hours. Cardiac Enzymes: No results for input(s): CKTOTAL, CKMB, CKMBINDEX, TROPONINI in the last 168 hours. BNP (last 3 results) No results for input(s): PROBNP in the last 8760 hours. HbA1C: No results for input(s): HGBA1C in the last 72 hours. CBG: No results for input(s): GLUCAP in the last 168 hours. Lipid  Profile: No results for input(s): CHOL, HDL, LDLCALC, TRIG, CHOLHDL, LDLDIRECT in the last 72 hours. Thyroid Function Tests: No results for input(s): TSH, T4TOTAL, FREET4, T3FREE, THYROIDAB in the last 72 hours. Anemia Panel: No results for input(s): VITAMINB12, FOLATE, FERRITIN, TIBC, IRON, RETICCTPCT in the last 72 hours. Sepsis Labs: No results for input(s): PROCALCITON, LATICACIDVEN in the last 168 hours.  Recent Results (from the past 240 hour(s))  Resp Panel by RT PCR (RSV, Flu A&B, Covid) - Nasopharyngeal Swab     Status: None   Collection Time: 10/02/20  9:32 PM   Specimen: Nasopharyngeal Swab  Result Value Ref Range Status   SARS Coronavirus 2 by RT PCR NEGATIVE NEGATIVE Final    Comment: (NOTE) SARS-CoV-2 target nucleic acids are NOT DETECTED.  The SARS-CoV-2 RNA is generally detectable in upper respiratoy specimens during the acute phase of infection. The lowest concentration of SARS-CoV-2 viral copies this assay can detect is 131 copies/mL. A negative result does not preclude SARS-Cov-2 infection and should not be used as the sole basis for treatment or other patient management decisions. A negative result may occur  with  improper specimen collection/handling, submission of specimen other than nasopharyngeal swab, presence of viral mutation(s) within the areas targeted by this assay, and inadequate number of viral copies (<131 copies/mL). A negative result must be combined with clinical observations, patient history, and epidemiological information. The expected result is Negative.  Fact Sheet for Patients:  https://www.moore.com/  Fact Sheet for Healthcare Providers:  https://www.young.biz/  This test is no t yet approved or cleared by the [place] FDA and  has been authorized for detection and/or diagnosis of SARS-CoV-2 by FDA under an Emergency Use Authorization (EUA). This EUA will remain  in effect (meaning this test can be used) for the duration of the COVID-19 declaration under Section 564(b)(1) of the Act, 21 U.S.C. section 360bbb-3(b)(1), unless the authorization is terminated or revoked sooner.     Influenza A by PCR NEGATIVE NEGATIVE Final   Influenza B by PCR NEGATIVE NEGATIVE Final    Comment: (NOTE) The Xpert Xpress SARS-CoV-2/FLU/RSV assay is intended as an aid in  the diagnosis of influenza from Nasopharyngeal swab specimens and  should not be used as a sole basis for treatment. Nasal washings and  aspirates are unacceptable for Xpert Xpress SARS-CoV-2/FLU/RSV  testing.  Fact Sheet for Patients: https://www.moore.com/  Fact Sheet for Healthcare Providers: https://www.young.biz/  This test is not yet approved or cleared by the [place] FDA and  has been authorized for detection and/or diagnosis of SARS-CoV-2 by  FDA under an Emergency Use Authorization (EUA). This EUA will remain  in effect (meaning this test can be used) for the duration of the  Covid-19 declaration under Section 564(b)(1) of the Act, 21  U.S.C. section 360bbb-3(b)(1), unless the authorization is  terminated or  revoked.    Respiratory Syncytial Virus by PCR NEGATIVE NEGATIVE Final    Comment: (NOTE) Fact Sheet for Patients: https://www.moore.com/  Fact Sheet for Healthcare Providers: https://www.young.biz/  This test is not yet approved or cleared by the [place] FDA and  has been authorized for detection and/or diagnosis of SARS-CoV-2 by  FDA under an Emergency Use Authorization (EUA). This EUA will remain  in effect (meaning this test can be used) for the duration of the  COVID-19 declaration under Section 564(b)(1) of the Act, 21 U.S.C.  section 360bbb-3(b)(1), unless the authorization is terminated or  revoked. Performed at [facility], [address]  [place], [name] [address]     RN Pressure Injury Documentation:     Estimated body mass index is 28.06 kg/m as calculated from the following:   Height as of this encounter: 5\' 9"  (1.753 m).   Weight as of this encounter: 86.2 kg.  Malnutrition Type:   Malnutrition Characteristics:   Nutrition Interventions:   Radiology Studies: DG CHEST PORT 1 VIEW  Result Date: 10/04/2020 CLINICAL DATA:  Dyspnea. EXAM: PORTABLE CHEST 1 VIEW COMPARISON:  Prior chest radiograph 10/02/2020. FINDINGS: Unchanged cardiomegaly. No appreciable airspace consolidation or pulmonary edema. No evidence of pleural effusion or pneumothorax. No acute bony abnormality identified. IMPRESSION: Cardiomegaly.  No evidence of acute cardiopulmonary abnormality. Electronically Signed   By: 13/01/2020 DO   On: 10/04/2020 08:37   Scheduled Meds:  atorvastatin  20 mg Oral Daily   enoxaparin (LOVENOX) injection  40 mg Subcutaneous Q24H   furosemide  40 mg Intravenous Q12H   gabapentin  200 mg Oral BID   guaiFENesin  1,200 mg Oral BID   hydrALAZINE  25 mg Oral Q8H   isosorbide mononitrate  30 mg Oral Daily   lidocaine  1 patch Transdermal Q24H   neomycin-bacitracin-polymyxin   Topical BID    sodium chloride flush  3 mL Intravenous Q12H   sodium chloride flush  3 mL Intravenous Q12H   Continuous Infusions:  sodium chloride      LOS: 2 days   13/03/2020, DO [facility] PAGER is on AMION  If 7PM-7AM, please contact night-coverage [email or web address]

## 2020-10-05 NOTE — TOC Initial Note (Signed)
Transition of Care [facility]) - Initial/Assessment Note    Patient Details  Name: [name] MRN: [id number] Date of Birth: Jun 22, 1966  Transition of [facility]) CM/SW Contact:    Darleene Cleaver, [name] Phone Number: 10/05/2020, 2:28 PM  Clinical Narrative:                  Patient requested to speak CSW.  CSW spoke to patient and he was asking about substance abuse resources.  Per patient he wants to get some help for substance abuse. Patient has history of stating he wants help, he is provided resources, however he does not follow through.  CSW provided patient with contact information on the [facility], [employer], [facility], [employer] services, and [employer].  CSW encouraged patient to follow up with [facility] and to get established with them.  Patient did not have any other requests.    Expected Discharge Plan: Home/Self Care Barriers to Discharge: Continued Medical Work up   Patient Goals and CMS Choice Patient states their goals for this hospitalization and ongoing recovery are:: To discharge home where he is staying. CMS Medicare.gov Compare Post Acute Care list provided to:: Patient Choice offered to / list presented to : Patient  Expected Discharge Plan and Services Expected Discharge Plan: Home/Self Care In-house Referral: Clinical Social Work     Living arrangements for the past 2 months: No permanent address Expected Discharge Date:  (unknown)                 DME Agency: NA                  Prior Living Arrangements/Services Living arrangements for the past 2 months: No permanent address Lives with:: Self Patient language and need for interpreter reviewed:: Yes Do you feel safe going back to the place where you live?: No   Patient states he does not havea anywhere permanent to live.  Need for Family Participation in Patient Care: No (Comment) Care giver support system in place?: No (comment)   Criminal Activity/Legal  Involvement Pertinent to Current Situation/Hospitalization: No - Comment as needed  Activities of Daily Living Home Assistive Devices/Equipment: None ADL Screening (condition at time of admission) Patient's cognitive ability adequate to safely complete daily activities?: Yes Is the patient deaf or have difficulty hearing?: No Does the patient have difficulty seeing, even when wearing glasses/contacts?: No Does the patient have difficulty concentrating, remembering, or making decisions?: No Patient able to express need for assistance with ADLs?: Yes Does the patient have difficulty dressing or bathing?: No Independently performs ADLs?: Yes (appropriate for developmental age) Does the patient have difficulty walking or climbing stairs?: Yes (secondary to shortness of breath) Weakness of Legs: None Weakness of Arms/Hands: None  Permission Sought/Granted Permission sought to share information with : Case Manager, Psychiatrist Permission granted to share information with : Yes, Release of Information Signed, Yes, Verbal Permission Granted  Share Information with NAME: No one           Emotional Assessment Appearance:: Appears stated age Attitude/Demeanor/Rapport: Guarded Affect (typically observed): Appropriate, Hopeful, Stable Orientation: : Oriented to Place, Oriented to  Time, Oriented to Self, Oriented to Situation Alcohol / Substance Use: Illicit Drugs Psych Involvement: Yes (comment)  Admission diagnosis:  Suicidal ideation [R45.851] Acute on chronic combined systolic and diastolic CHF (congestive heart failure) (HCC) [I50.43] Acute on chronic congestive heart failure, unspecified heart failure type (HCC) [I50.9] Patient Active Problem List   Diagnosis Date Noted  .  Acute on chronic combined systolic and diastolic CHF (congestive heart failure) (HCC) 10/02/2020  . Near syncope 07/21/2020  . Schizoaffective disorder (HCC) 06/24/2019  . SOB (shortness of breath)   . QT  prolongation 05/25/2019  . Cocaine abuse (HCC)   . Elevated troponin   . Homelessness   . Suicidal ideation   . Bilateral foot pain 04/05/2019  . Housing situation unstable 03/28/2019  . Bilateral lower extremity edema   . Asthma 12/26/2016  . Tobacco use disorder 12/26/2016  . Schizophrenia (HCC) 12/26/2016  . Cocaine abuse with cocaine-induced mood disorder (HCC) 09/06/2016  . Alcohol use disorder, mild, abuse 07/31/2016  . Substance induced mood disorder (HCC) 07/30/2016  . Schizoaffective disorder, bipolar type (HCC) 05/31/2016  . Cocaine use disorder, severe, dependence (HCC) 05/31/2016  . Essential hypertension 05/31/2016   PCP:  Patient, No Pcp Per Pharmacy:   [facility] [place], [name] - [address] [name] [address] Phone: [phone] Fax: [phone]     Social Determinants of Health (SDOH) Interventions    Readmission Risk Interventions Readmission Risk Prevention Plan 10/05/2020 07/23/2020 04/06/2019  Transportation Screening Complete Complete Complete  PCP or Specialist Appt within 3-5 Days - Complete Complete  HRI or Home Care Consult - Complete Complete  Social Work Consult for Recovery Care Planning/Counseling - Complete Complete  Palliative Care Screening - Not Applicable Not Applicable  Medication Review Oceanographer) Referral to Pharmacy Complete Complete  PCP or Specialist appointment within 3-5 days of discharge Complete - -  HRI or Home Care Consult Patient refused - -  SW Recovery Care/Counseling Consult Complete - -  Palliative Care Screening Not Applicable - -  Skilled Nursing Facility Not Applicable - -  Some recent data might be hidden

## 2020-10-05 NOTE — Progress Notes (Signed)
Pt refused writer to assess him. Did not want to be touched. Educated patient still unsuccessful.

## 2020-10-06 ENCOUNTER — Inpatient Hospital Stay (HOSPITAL_COMMUNITY): Payer: Medicaid - Out of State

## 2020-10-06 DIAGNOSIS — J441 Chronic obstructive pulmonary disease with (acute) exacerbation: Secondary | ICD-10-CM

## 2020-10-06 LAB — CBC WITH DIFFERENTIAL/PLATELET
Abs Immature Granulocytes: 0.01 10*3/uL (ref 0.00–0.07)
Basophils Absolute: 0.1 10*3/uL (ref 0.0–0.1)
Basophils Relative: 1 %
Eosinophils Absolute: 0.1 10*3/uL (ref 0.0–0.5)
Eosinophils Relative: 2 %
HCT: 46.7 % (ref 39.0–52.0)
Hemoglobin: 15.5 g/dL (ref 13.0–17.0)
Immature Granulocytes: 0 %
Lymphocytes Relative: 39 %
Lymphs Abs: 2.5 10*3/uL (ref 0.7–4.0)
MCH: 33.1 pg (ref 26.0–34.0)
MCHC: 33.2 g/dL (ref 30.0–36.0)
MCV: 99.8 fL (ref 80.0–100.0)
Monocytes Absolute: 0.8 10*3/uL (ref 0.1–1.0)
Monocytes Relative: 13 %
Neutro Abs: 3 10*3/uL (ref 1.7–7.7)
Neutrophils Relative %: 45 %
Platelets: 254 10*3/uL (ref 150–400)
RBC: 4.68 MIL/uL (ref 4.22–5.81)
RDW: 15.4 % (ref 11.5–15.5)
WBC: 6.4 10*3/uL (ref 4.0–10.5)
nRBC: 0 % (ref 0.0–0.2)

## 2020-10-06 LAB — COMPREHENSIVE METABOLIC PANEL
ALT: 17 U/L (ref 0–44)
AST: 16 U/L (ref 15–41)
Albumin: 3.9 g/dL (ref 3.5–5.0)
Alkaline Phosphatase: 65 U/L (ref 38–126)
Anion gap: 12 (ref 5–15)
BUN: 20 mg/dL (ref 6–20)
CO2: 22 mmol/L (ref 22–32)
Calcium: 9.1 mg/dL (ref 8.9–10.3)
Chloride: 104 mmol/L (ref 98–111)
Creatinine, Ser: 1.33 mg/dL — ABNORMAL HIGH (ref 0.61–1.24)
GFR, Estimated: >60 mL/min (ref >60)
Glucose, Bld: 113 mg/dL — ABNORMAL HIGH (ref 70–99)
Potassium: 3.8 mmol/L (ref 3.5–5.1)
Sodium: 138 mmol/L (ref 135–145)
Total Bilirubin: 0.6 mg/dL (ref 0.3–1.2)
Total Protein: 6.9 g/dL (ref 6.5–8.1)

## 2020-10-06 LAB — PHOSPHORUS: Phosphorus: 4.4 mg/dL (ref 2.5–4.6)

## 2020-10-06 LAB — MAGNESIUM: Magnesium: 2.1 mg/dL (ref 1.7–2.4)

## 2020-10-06 IMAGING — DX DG CHEST 1V PORT
1 series · 1 of 1 positions shown · non-contrast
Comparison: [DATE]

CLINICAL DATA: Shortness of breath.

EXAM:
PORTABLE CHEST 1 VIEW

[chest ap]
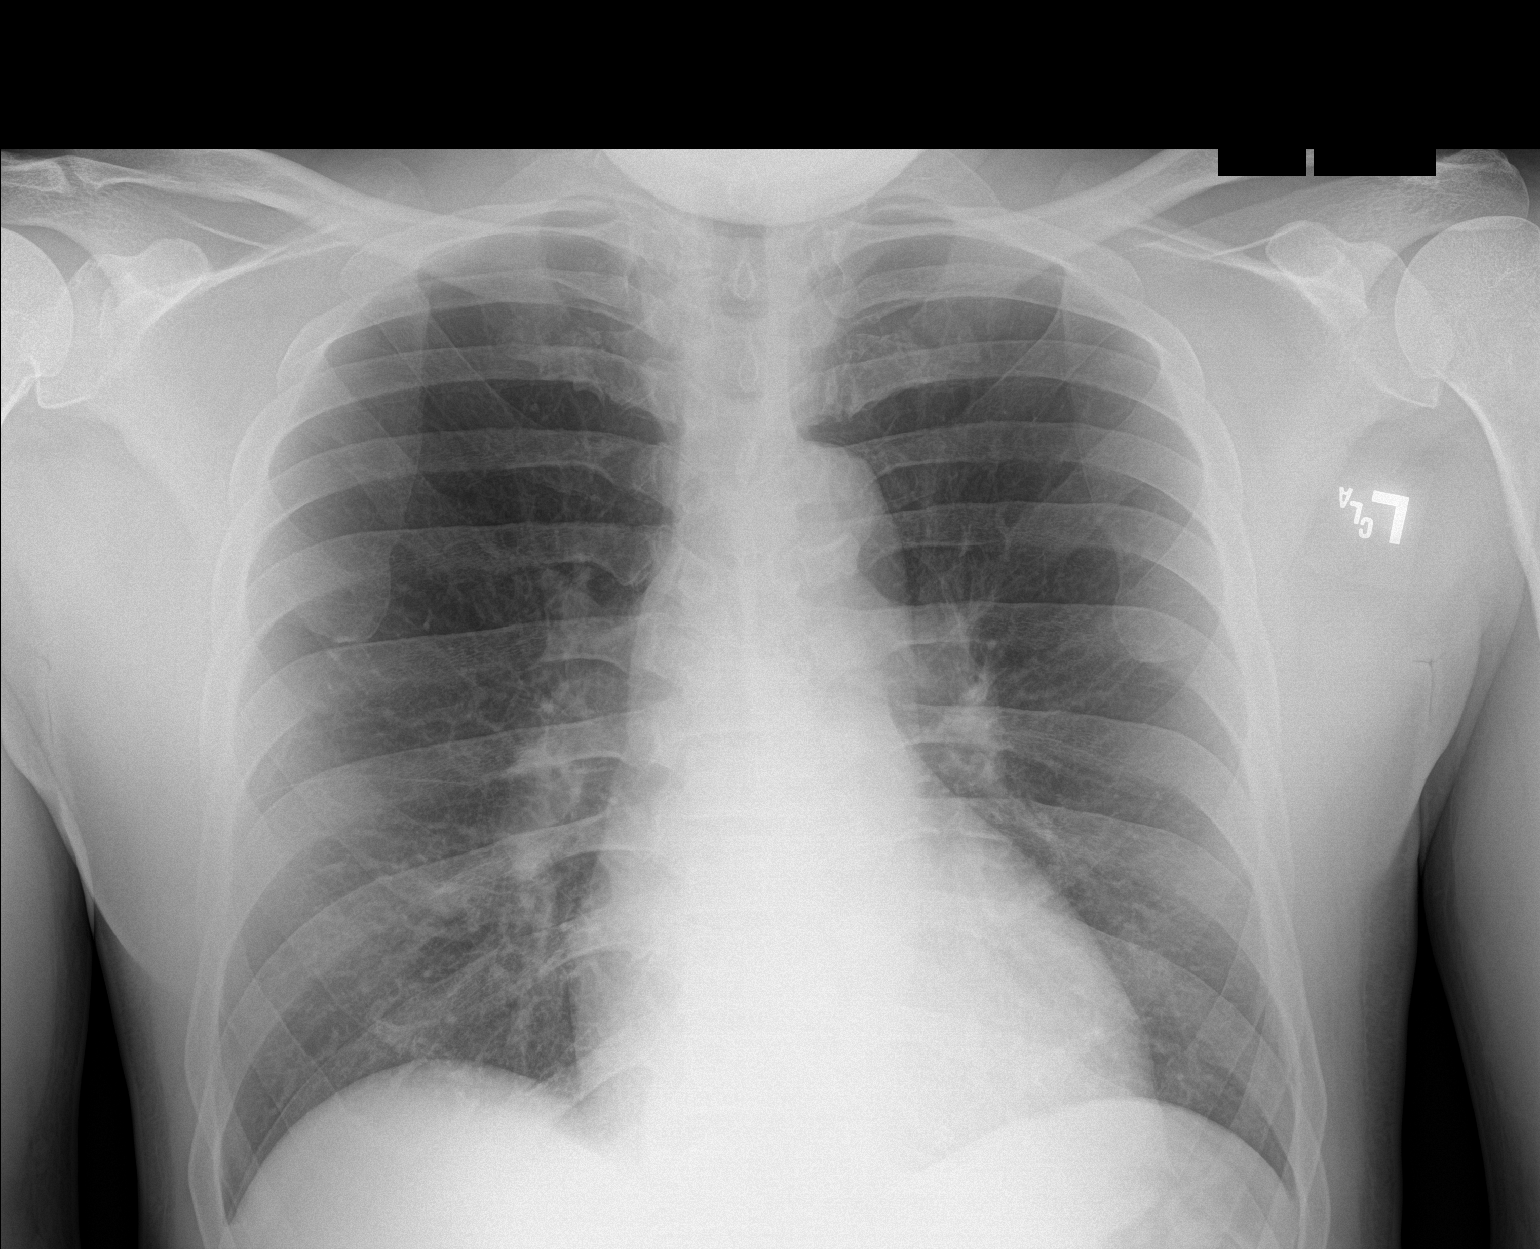

[1 of 1 positions shown; findings below may reference images not displayed]

FINDINGS: The heart size and mediastinal contours are within normal limits.
Both lungs are clear. The visualized skeletal structures are
unremarkable.
IMPRESSION: No active disease.

## 2020-10-06 MED ORDER — IPRATROPIUM BROMIDE 0.02 % IN SOLN
0.5000 mg | Freq: Four times a day (QID) | RESPIRATORY_TRACT | Status: DC
  Administered 2020-10-06 – 2020-10-07 (×4): 0.5 mg via RESPIRATORY_TRACT
  Filled 2020-10-06 (×3): qty 2.5

## 2020-10-06 MED ORDER — LEVALBUTEROL HCL 0.63 MG/3ML IN NEBU
0.6300 mg | INHALATION_SOLUTION | Freq: Four times a day (QID) | RESPIRATORY_TRACT | Status: DC
  Administered 2020-10-06 – 2020-10-07 (×4): 0.63 mg via RESPIRATORY_TRACT
  Filled 2020-10-06 (×3): qty 3

## 2020-10-06 MED ORDER — METHYLPREDNISOLONE SODIUM SUCC 125 MG IJ SOLR
60.0000 mg | Freq: Two times a day (BID) | INTRAMUSCULAR | Status: DC
  Administered 2020-10-06: 125 mg via INTRAVENOUS
  Administered 2020-10-06 – 2020-10-07 (×2): 60 mg via INTRAVENOUS
  Filled 2020-10-06 (×3): qty 2

## 2020-10-06 MED ORDER — BUDESONIDE 0.25 MG/2ML IN SUSP
0.2500 mg | Freq: Two times a day (BID) | RESPIRATORY_TRACT | Status: DC
  Administered 2020-10-06 – 2020-10-07 (×3): 0.25 mg via RESPIRATORY_TRACT
  Filled 2020-10-06 (×2): qty 2

## 2020-10-06 MED ORDER — DOXYCYCLINE HYCLATE 100 MG PO TABS
100.0000 mg | ORAL_TABLET | Freq: Two times a day (BID) | ORAL | Status: DC
  Administered 2020-10-06 – 2020-10-07 (×2): 100 mg via ORAL
  Filled 2020-10-06 (×2): qty 1

## 2020-10-06 NOTE — Progress Notes (Signed)
Pt refused telemetry.

## 2020-10-06 NOTE — Progress Notes (Signed)
Per Dr. Verna Czech request, 3 week f/u requested. Have sent a message to our office's scheduling team requesting a follow-up appointment, and our office will call the patient with this information.

## 2020-10-06 NOTE — Progress Notes (Signed)
Progress Note  Patient Name: [name] Date of Encounter: 10/06/2020  [facility] Cardiologist: Little Ishikawa, MD   Subjective   Some ongoign SOB  Inpatient Medications    Scheduled Meds: . atorvastatin  20 mg Oral Daily  . clopidogrel  75 mg Oral Daily  . enoxaparin (LOVENOX) injection  40 mg Subcutaneous Q24H  . furosemide  40 mg Oral Daily  . gabapentin  200 mg Oral BID  . guaiFENesin  1,200 mg Oral BID  . hydrALAZINE  50 mg Oral Q8H  . isosorbide mononitrate  30 mg Oral Daily  . lidocaine  1 patch Transdermal Q24H  . methylPREDNISolone (SOLU-MEDROL) injection  60 mg Intravenous Q12H  . neomycin-bacitracin-polymyxin   Topical BID  . sodium chloride flush  3 mL Intravenous Q12H  . sodium chloride flush  3 mL Intravenous Q12H   Continuous Infusions: . sodium chloride     PRN Meds: sodium chloride, albuterol, hydrALAZINE, ipratropium, levalbuterol, oxyCODONE, senna-docusate, sodium chloride flush   Vital Signs    Vitals:   10/05/20 1045 10/05/20 1321 10/05/20 2134 10/06/20 0533  BP:  (!) 133/107 (!) 153/95 (!) 154/101  Pulse:  73 80 91  Resp:  18 20 20   Temp:  (!) 97.4 F (36.3 C)    TempSrc:  Oral    SpO2: 99% 100% 98% 100%  Weight:      Height:        Intake/Output Summary (Last 24 hours) at 10/06/2020 0809 Last data filed at 10/06/2020 0536 Gross per 24 hour  Intake 723 ml  Output 5030 ml  Net -4307 ml   Last 3 Weights 10/05/2020 10/03/2020 10/02/2020  Weight (lbs) 190 lb 0.6 oz 181 lb 14.1 oz 190 lb  Weight (kg) 86.2 kg 82.5 kg 86.183 kg  Some encounter information is confidential and restricted. Go to Review Flowsheets activity to see all data.      Telemetry    SR - Personally Reviewed  ECG    n/a - Personally Reviewed  Physical Exam   GEN: No acute distress.   Neck: No JVD Cardiac: RRR, no murmurs, rubs, or gallops.  Respiratory: Clear to auscultation bilaterally. GI: Soft, nontender, non-distended  MS: No edema; No  deformity. Neuro:  Nonfocal  Psych: Normal affect   Labs    High Sensitivity Troponin:   Recent Labs  Lab 09/25/20 1149 09/25/20 1524 10/02/20 1945 10/02/20 2100  TROPONINIHS 31* 28* 71* 69*      Chemistry Recent Labs  Lab 10/04/20 0840 10/05/20 0515 10/06/20 0721  NA 139 136 138  K 3.9 3.8 3.8  CL 105 102 104  CO2 23 19* 22  GLUCOSE 171* 111* 113*  BUN 25* 25* 20  CREATININE 1.56* 1.59* 1.33*  CALCIUM 8.7* 8.6* 9.1  PROT 6.7 6.3* 6.9  ALBUMIN 3.5 3.4* 3.9  AST 21 15 16   ALT 22 17 17   ALKPHOS 75 65 65  BILITOT 1.0 0.8 0.6  GFRNONAA 53* 52* >60  ANIONGAP 11 15 12      Hematology Recent Labs  Lab 10/04/20 0840 10/05/20 0515 10/06/20 0721  WBC 5.0 6.2 6.4  RBC 4.59 4.42 4.68  HGB 15.0 14.6 15.5  HCT 45.1 44.3 46.7  MCV 98.3 100.2* 99.8  MCH 32.7 33.0 33.1  MCHC 33.3 33.0 33.2  RDW 15.4 15.4 15.4  PLT 244 244 254    BNP Recent Labs  Lab 10/02/20 1945 10/04/20 0840 10/05/20 0515  BNP 1,257.1* 325.5* 356.6*     DDimer  No results for input(s): DDIMER in the last 168 hours.   Radiology    DG CHEST PORT 1 VIEW  Result Date: 10/06/2020 CLINICAL DATA:  Shortness of breath. EXAM: PORTABLE CHEST 1 VIEW COMPARISON:  10/04/2020 FINDINGS: The heart size and mediastinal contours are within normal limits. Both lungs are clear. The visualized skeletal structures are unremarkable. IMPRESSION: No active disease. Electronically Signed   By: Signa Kell M.D.   On: 10/06/2020 06:36   DG CHEST PORT 1 VIEW  Result Date: 10/04/2020 CLINICAL DATA:  Dyspnea. EXAM: PORTABLE CHEST 1 VIEW COMPARISON:  Prior chest radiograph 10/02/2020. FINDINGS: Unchanged cardiomegaly. No appreciable airspace consolidation or pulmonary edema. No evidence of pleural effusion or pneumothorax. No acute bony abnormality identified. IMPRESSION: Cardiomegaly.  No evidence of acute cardiopulmonary abnormality. Electronically Signed   By: Jackey Loge DO   On: 10/04/2020 08:37    Cardiac  Studies     Patient Profile     [name] is a 54 y.o. male with a PMH of CAD, chronic combined CHF, HTN, HLD, COPD, asthma, schizoaffective disorder, cocaine abuse, and tobacco abuse, who is being seen today for the evaluation of CHF at the request of Dr. Marland Mcalpine.  Assessment & Plan    1. Acute on chronic combined systolic/diastolic HF - 08/2020 echo LVEF 40-45%, grade II DDx, normal RV function.  - prior LVEF 03/2019 15% - in general medical management has been limited by poor compliance, ongoing cocaine use  - medical therapy with hydral 50mg  tid, imdur 30mg  daily. No ACE/ARB/ARNI/aldactone due to labile renal function. Not on beta blocker I presume due to cocaine history.   - received IV lasix 40mg  x 1 yesterday, scheduled to start oral lasix 40mg  daily today - I/Os indicate neg 4.3 L yesterday. Cr trended down  2. CAD - 03/2020 cardiac CT at [facility] in care everywhere mod to severe RCA, LCX,LAD disease. CT did not include FFR, has not had a functional assessment of ischemia - due to poor compliance and ongoing substance abuse he is not a cath candidate. Manage his CAD medically - has NSAID allergy so on plavix. Continue statin. No ACE/ARB due to renal function, has not been on beta blocker I presume due to cocaine use. He is on imdur 30mg  daily, can titrate as needed - mild trop this admit in setting of CHF, cocaine + this admission. Not consistent with ACS   3. SOB - appears euvolemic - describes some intermittent wheezing, mildly productive cough. Breathing improved with breathing treatment yesterday though only getting prn. Symptoms more cosnistent with pulmonary process - will try scheduled breathing treatments today, I don't see his SOB is cardiac at this time.     We will sign off inpatient care and arrange for outpatient follow up. No further cardiac inpatient testing or interventions are planned.     For questions or updates, please contact [facility] Please  consult [email or web address] for contact info under        Signed, , MD  10/06/2020, 8:09 AM

## 2020-10-06 NOTE — Progress Notes (Signed)
Pt BP: 154/101 HR: 91. Pt refused to lay flat on back for vital signs and refused to let us re-check BP. Administered scheduled 50 mg apresoline and notified MD.

## 2020-10-06 NOTE — Progress Notes (Signed)
PROGRESS NOTE    Kalep Full  WER:[id number] DOB: 03/02/1966 DOA: 10/02/2020 PCP: Patient, No Pcp Per  Brief Narrative:  HPI per Dr. Odie Sera on 10/02/20 HPI: [name] is a 54 y.o. male with medical history significant for chronic combined CHF, cocaine abuse, depression, and hypertension, now presenting to the emergency department for evaluation of shortness of breath and suicidal ideations.  Patient was just discharged from the hospital few days ago, has not taken any of his prescription medications since then, has become progressively dyspneic, and his been feeling hopeless and suicidal, indicating that he plans to jump out into traffic.  He denies any chest pain, fevers, or chills.  He has a cough that he describes as chronic.  ED Course: Upon arrival to the ED, patient is found to be afebrile, saturating 100% on room air, normal respiratory rate, and stable blood pressure.  EKG features sinus rhythm with LVH and prolonged QT interval.  Chest x-rays negative for acute findings.  Chemistry panel with mild elevations in creatinine and total bilirubin that appear stable.  Covid screening test not yet collected.  Troponin mildly elevated and essentially unchanged when repeated.   **Interim History  Continues to remain volume overloaded and still getting diuresed.  Psychiatry evaluating for further management they felt that the patient would benefit from residential treatment which was recommended in the past.  They feel that he does not need inpatient psychiatric care at this time.  10/05/2020: Still feels dyspneic today and had minimal wheezing but saturating well without supplemental nasal cannula.  Wanting to be transferred to [facility].  Does not feel like he is improving so cardiology was consulted for further evaluation recommendations.  States that he wants to speak to a psychiatrist again today were also reconsulted.  Assessment & Plan:   Principal Problem:   Acute on chronic  combined systolic and diastolic CHF (congestive heart failure) (HCC) Active Problems:   Essential hypertension   Elevated troponin   Suicidal ideation  Acute on chronic combined Systolic and Diastolic CHF  -Patient with CHF, cocaine abuse, and medication non-compliance presents with SOB  -Has a history of nonadherence to home medications -Echo from 09/26/20 with EF 40-45% and grade II DD -BNP on Admission was elevated at 1257.1 and repeat yesterday morning was 356.6 -Currently not requiring any supplemental oxygen via nasal cannula -IV diuresis is now stopped and he is changed to p.o. 40 mg of Lasix daily and his diet was changed to regular diet from a heart healthy and he still continues his fluid restriction -Continue to monitor strict I's and O's, daily weights; Patient is - 5.724 L and Repeat Weight is essentially the same as admission today -Continue to monitor electrolytes and renal function while patient is getting diuresed: BUN/creatinine slightly elevated from yesterday to today and is now 25/1.56 and slightly further worsened to 25/1.59 -Continue to monitor for signs and symptoms of volume overload and repeat a chest x-ray in a.m -Chest x-ray yesterday morning showed "The heart size and mediastinal contours are within normal limits. Both lungs are clear. The visualized skeletal structures are unremarkable."  -Added as needed duo nebs for breathing given his slight wheezing -Because the patient still feels dyspneic and is appropriately being diuresed we will consult cardiology for further evaluation recommendations  Acute Exacerbation of COPD -Has been adequately diuresed and continued to wheeze some  -Started doxycycline 100 mg p.o. every 12 for 5 days  -Also continue with Xopenex/Atrovent scheduled every 6  -Started  Solu-Medrol 60 mg every 12 next-continue guaifenesin 1200 mg p.o. twice daily, flutter valve, and incentive spirometry -Need to ambulate the patient prior to  discharge to ensure that he does not desaturate -Currently not requiring supplemental oxygen via nasal cannula  Hyperbilirubinemia -Patient's T Bili was 1.9 and trended down to 0.6 and normalized -Likely Reactive -Continue to Monitor and Trend carefully -Repeat CMP in the AM  Suicidal ideation in a Hx of Schizoaffective Disorder and Bipolar 1 Disorder -Patient reports feelings of hopelessness with frequent SI, planned to jump into traffic but Psych evaluated and felt he was not really Suicidial  -Continued Suicide precautions and 1:1 sitter but removed now that he is cleared by Psych -Psychiatry recommending Residential Treatment and the patient does not need inpatient psychiatric care -Patient wanted to speak with psychiatry again so they were reconsulted and now they have signed off and recommending outpatient resources and TOC to provide them  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/Tobacco Abuse -Patient was Positive for Cocaine again on UDS -Cocain and and Tobacco Counseling given for cessation  Chronic kidney disease stage IIIa, stable improved Metabolic Acidosis -Baseline creatinine recently has been ranging from 1.2-1.5 the last few years -BUN/creatinine is now stable after diuresis and is 20/1.33 -Metabolic acidosis has improved and resolved a CO2 is now 22, chloride level is 104, and anion gap is 12 -avoid further nephrotoxic medications, contrast dyes, hypotension and renally adjust medications  -Continue monitor and trend renal function and repeat CMP in a.m as he is getting diuresis with IV Lasix  Essential Hypertension -Continue IV Lasix as above, and Imdur 30 mg po Daily and Hydralazine 25 mg TID was increased to 50 mg 3 times daily by cardiology -Continue to monitor blood pressures per protocol -Last blood pressure was 131/73 -we will add IV hydralazine 10 mg every 6 hours as needed for systolic blood pressure greater than 180 or diastolic blood  pressure greater than 100   Hyperlipidemia -C/w Atorvastatin 20 mg po qHS  Elevated Troponin  -HS troponin mildly elevated in ED without anginal complaints   -troponin went from 71 and is now 71 -Likely related to cocaine abuse and CHF, no further inpatient workup anticipated  -Continues to be dyspneic so cardiology was consulted for further evaluation recommendations  Prolonged QT interval  -Minimize QT-prolonging medications, monitor electrolytes and replace as needed   -Patient's K+ is 3.8 and magnesium is 2.0  Hyperglycemia in the setting of Pre-Diabetes -Patient blood sugar has been ranging from 99-171 on BMP/CMP  -Hemoglobin A1c was 5.9 almost a month ago -Continue to monitor blood sugars carefully and if necessary will place on sensitive NovoLog/scale insulin AC  DVT prophylaxis: Enoxaparin 40 mg sq q2h Code Status: FULL CODE Family Communication: No family present at bedside  Disposition Plan: Pending further improvement back to his dry weight; psychiatry is cleared the patient: Patient is now diuresed to his dry weight and anticipating discharging home in a.m. as his respiratory status is improving given that he has a COPD exacerbation  Status is: Inpatient  Remains inpatient appropriate because:IV treatments appropriate due to intensity of illness or inability to take PO and Inpatient level of care appropriate due to severity of illness   Dispo: The patient is from: Home              Anticipated d/c is to: Home              Anticipated d/c date is: 1 day  Patient currently is not medically stable to d/c.  Consultants:   Psychiatry  Cardiology   Procedures: None  Antimicrobials:  Anti-infectives (From admission, onward)   Start     Dose/Rate Route Frequency Ordered Stop   10/06/20 1000  doxycycline (VIBRA-TABS) tablet 100 mg        100 mg Oral Every 12 hours 10/06/20 0840 10/11/20 0959       Subjective: Seen and examined at bedside and  states he had a better night last night and his shortness of breath is improving somewhat.  He was wheezing but he states that the breathing treatment helped.  No chest pain, lightheadedness or dizziness.  Thinks the fluid in his legs have improved and he is less swollen.  Appeared calm.  No nausea or vomiting.  No other concerns or complaints at this time.  Anticipating discharging home in the a.m. and I spoke to the patient about this and he understands agrees.  Objective: Vitals:   10/05/20 2134 10/06/20 0533 10/06/20 0856 10/06/20 1259  BP: (!) 153/95 (!) 154/101  131/73  Pulse: 80 91  91  Resp: Temp:    97.8 F (36.6 C)  TempSrc:    Oral  SpO2: 98% 100% 99% 100%  Weight:      Height:        Intake/Output Summary (Last 24 hours) at 10/06/2020 1753 Last data filed at 10/06/2020 1300 Gross per 24 hour  Intake 720 ml  Output 3430 ml  Net -2710 ml   Filed Weights   10/02/20 1815 10/03/20 0800 10/05/20 0500  Weight: 86.2 kg 82.5 kg 86.2 kg   Examination: Physical Exam:  Constitutional: WN/WD overweight African-American male currently no acute distress appears calm and comfortable today Eyes: Lids and conjunctivae normal, sclerae anicteric  ENMT: External Ears, Nose appear normal. Grossly normal hearing.  Neck: Appears normal, supple, no cervical masses, normal ROM, no appreciable thyromegaly: No JVD Respiratory: Slight diminished to auscultation bilaterally with coarse breath sounds and some slight wheezing, no appreciable rales, rhonchi or crackles. Normal respiratory effort and patient is not tachypenic. No accessory muscle use.  Not wearing supplemental him he is Cardiovascular: RRR, no murmurs / rubs / gallops. S1 and S2 auscultated.  Lower extremity edema Abdomen: Soft, non-tender, non-distended. Bowel sounds positive.  GU: Deferred. Musculoskeletal: No clubbing / cyanosis of digits/nails. No joint deformity upper and lower extremities.  Skin: He has a calloused  rectal lesion. No induration; Warm and dry.  Neurologic: CN 2-12 grossly intact with no focal deficits.  Romberg sign and cerebellar reflexes not assessed.  Psychiatric: Normal judgment and insight. Alert and oriented x 3. Normal mood and appropriate affect.   Data Reviewed: I have personally reviewed following labs and imaging studies  CBC: Recent Labs  Lab 10/02/20 1945 10/03/20 0822 10/04/20 0840 10/05/20 0515 10/06/20 0721  WBC 7.0 6.0 5.0 6.2 6.4  NEUTROABS 4.4 2.8 2.3 2.8 3.0  HGB 14.2 15.0 15.0 14.6 15.5  HCT 42.4 44.5 45.1 44.3 46.7  MCV 98.6 98.0 98.3 100.2* 99.8  PLT 263 276 244 244 254   Basic Metabolic Panel: Recent Labs  Lab 10/02/20 1945 10/03/20 0822 10/04/20 0840 10/05/20 0515 10/06/20 0721  NA 138 139 139 136 138  K 3.7 4.1 3.9 3.8 3.8  CL 103 106 105 102 104  CO2 19* 22  GLUCOSE 141* 99 171* 111* 113*  BUN 23* 22* 25* 25* 20  CREATININE 1.40* 1.43* 1.56* 1.59*  1.33*  CALCIUM 8.8* 8.8* 8.7* 8.6* 9.1  MG  --  2.4 2.2 2.0 2.1  PHOS  --  4.0 4.6 4.1 4.4   GFR: Estimated Creatinine Clearance: 70.7 mL/min (A) (by C-G formula based on SCr of 1.33 mg/dL (H)). Liver Function Tests: Recent Labs  Lab 10/02/20 1945 10/03/20 0822 10/04/20 0840 10/05/20 0515 10/06/20 0721  AST 26 26 21 15 16   ALT 23 24 22 17 17   ALKPHOS 85 84 75 65 65  BILITOT 1.9* 1.6* 1.0 0.8 0.6  PROT 6.9 6.7 6.7 6.3* 6.9  ALBUMIN 3.8 3.7 3.5 3.4* 3.9   No results for input(s): LIPASE, AMYLASE in the last 168 hours. No results for input(s): AMMONIA in the last 168 hours. Coagulation Profile: No results for input(s): INR, PROTIME in the last 168 hours. Cardiac Enzymes: No results for input(s): CKTOTAL, CKMB, CKMBINDEX, TROPONINI in the last 168 hours. BNP (last 3 results) No results for input(s): PROBNP in the last 8760 hours. HbA1C: No results for input(s): HGBA1C in the last 72 hours. CBG: No results for input(s): GLUCAP in the last 168 hours. Lipid Profile: No  results for input(s): CHOL, HDL, LDLCALC, TRIG, CHOLHDL, LDLDIRECT in the last 72 hours. Thyroid Function Tests: No results for input(s): TSH, T4TOTAL, FREET4, T3FREE, THYROIDAB in the last 72 hours. Anemia Panel: No results for input(s): VITAMINB12, FOLATE, FERRITIN, TIBC, IRON, RETICCTPCT in the last 72 hours. Sepsis Labs: No results for input(s): PROCALCITON, LATICACIDVEN in the last 168 hours.  Recent Results (from the past 240 hour(s))  Resp Panel by RT PCR (RSV, Flu A&B, Covid) - Nasopharyngeal Swab     Status: None   Collection Time: 10/02/20  9:32 PM   Specimen: Nasopharyngeal Swab  Result Value Ref Range Status   SARS Coronavirus 2 by RT PCR NEGATIVE NEGATIVE Final    Comment: (NOTE) SARS-CoV-2 target nucleic acids are NOT DETECTED.  The SARS-CoV-2 RNA is generally detectable in upper respiratoy specimens during the acute phase of infection. The lowest concentration of SARS-CoV-2 viral copies this assay can detect is 131 copies/mL. A negative result does not preclude SARS-Cov-2 infection and should not be used as the sole basis for treatment or other patient management decisions. A negative result may occur with  improper specimen collection/handling, submission of specimen other than nasopharyngeal swab, presence of viral mutation(s) within the areas targeted by this assay, and inadequate number of viral copies (<131 copies/mL). A negative result must be combined with clinical observations, patient history, and epidemiological information. The expected result is Negative.  Fact Sheet for Patients:  https://www.moore.com/  Fact Sheet for Healthcare Providers:  https://www.young.biz/  This test is no t yet approved or cleared by the [place] FDA and  has been authorized for detection and/or diagnosis of SARS-CoV-2 by FDA under an Emergency Use Authorization (EUA). This EUA will remain  in effect (meaning this test can be used)  for the duration of the COVID-19 declaration under Section 564(b)(1) of the Act, 21 U.S.C. section 360bbb-3(b)(1), unless the authorization is terminated or revoked sooner.     Influenza A by PCR NEGATIVE NEGATIVE Final   Influenza B by PCR NEGATIVE NEGATIVE Final    Comment: (NOTE) The Xpert Xpress SARS-CoV-2/FLU/RSV assay is intended as an aid in  the diagnosis of influenza from Nasopharyngeal swab specimens and  should not be used as a sole basis for treatment. Nasal washings and  aspirates are unacceptable for Xpert Xpress SARS-CoV-2/FLU/RSV  testing.  Fact Sheet for Patients: https://www.moore.com/  Fact Sheet for Healthcare Providers: https://www.young.biz/  This test is not yet approved or cleared by the [place] FDA and  has been authorized for detection and/or diagnosis of SARS-CoV-2 by  FDA under an Emergency Use Authorization (EUA). This EUA will remain  in effect (meaning this test can be used) for the duration of the  Covid-19 declaration under Section 564(b)(1) of the Act, 21  U.S.C. section 360bbb-3(b)(1), unless the authorization is  terminated or revoked.    Respiratory Syncytial Virus by PCR NEGATIVE NEGATIVE Final    Comment: (NOTE) Fact Sheet for Patients: https://www.moore.com/  Fact Sheet for Healthcare Providers: https://www.young.biz/  This test is not yet approved or cleared by the [place] FDA and  has been authorized for detection and/or diagnosis of SARS-CoV-2 by  FDA under an Emergency Use Authorization (EUA). This EUA will remain  in effect (meaning this test can be used) for the duration of the  COVID-19 declaration under Section 564(b)(1) of the Act, 21 U.S.C.  section 360bbb-3(b)(1), unless the authorization is terminated or  revoked. Performed at [facility], [address] [place], [name] [address]     RN Pressure Injury  Documentation:     Estimated body mass index is 28.06 kg/m as calculated from the following:   Height as of this encounter: 5\' 9"  (1.753 m).   Weight as of this encounter: 86.2 kg.  Malnutrition Type:   Malnutrition Characteristics:   Nutrition Interventions:   Radiology Studies: DG CHEST PORT 1 VIEW  Result Date: 10/06/2020 CLINICAL DATA:  Shortness of breath. EXAM: PORTABLE CHEST 1 VIEW COMPARISON:  10/04/2020 FINDINGS: The heart size and mediastinal contours are within normal limits. Both lungs are clear. The visualized skeletal structures are unremarkable. IMPRESSION: No active disease. Electronically Signed   By: 13/03/2020 M.D.   On: 10/06/2020 06:36   Scheduled Meds: . atorvastatin  20 mg Oral Daily  . budesonide (PULMICORT) nebulizer solution  0.25 mg Nebulization BID  . clopidogrel  75 mg Oral Daily  . doxycycline  100 mg Oral Q12H  . enoxaparin (LOVENOX) injection  40 mg Subcutaneous Q24H  . furosemide  40 mg Oral Daily  . gabapentin  200 mg Oral BID  . guaiFENesin  1,200 mg Oral BID  . hydrALAZINE  50 mg Oral Q8H  . ipratropium  0.5 mg Nebulization Q6H WA  . isosorbide mononitrate  30 mg Oral Daily  . levalbuterol  0.63 mg Nebulization Q6H WA  . lidocaine  1 patch Transdermal Q24H  . methylPREDNISolone (SOLU-MEDROL) injection  60 mg Intravenous Q12H  . neomycin-bacitracin-polymyxin   Topical BID  . sodium chloride flush  3 mL Intravenous Q12H  . sodium chloride flush  3 mL Intravenous Q12H   Continuous Infusions: . sodium chloride      LOS: 3 days   Etan Vasudevan 13/05/2020, DO [facility] PAGER is on AMION  If 7PM-7AM, please contact night-coverage [email or web address]

## 2020-10-07 LAB — COMPREHENSIVE METABOLIC PANEL
ALT: 20 U/L (ref 0–44)
AST: 20 U/L (ref 15–41)
Albumin: 3.9 g/dL (ref 3.5–5.0)
Alkaline Phosphatase: 71 U/L (ref 38–126)
Anion gap: 10 (ref 5–15)
BUN: 20 mg/dL (ref 6–20)
CO2: 21 mmol/L — ABNORMAL LOW (ref 22–32)
Calcium: 9.7 mg/dL (ref 8.9–10.3)
Chloride: 104 mmol/L (ref 98–111)
Creatinine, Ser: 1.13 mg/dL (ref 0.61–1.24)
GFR, Estimated: >60 mL/min (ref >60)
Glucose, Bld: 337 mg/dL — ABNORMAL HIGH (ref 70–99)
Potassium: 4.4 mmol/L (ref 3.5–5.1)
Sodium: 135 mmol/L (ref 135–145)
Total Bilirubin: 0.5 mg/dL (ref 0.3–1.2)
Total Protein: 7.7 g/dL (ref 6.5–8.1)

## 2020-10-07 LAB — CBC WITH DIFFERENTIAL/PLATELET
Abs Immature Granulocytes: 0.06 10*3/uL (ref 0.00–0.07)
Basophils Absolute: 0 10*3/uL (ref 0.0–0.1)
Basophils Relative: 0 %
Eosinophils Absolute: 0 10*3/uL (ref 0.0–0.5)
Eosinophils Relative: 0 %
HCT: 47.3 % (ref 39.0–52.0)
Hemoglobin: 15.8 g/dL (ref 13.0–17.0)
Immature Granulocytes: 0 %
Lymphocytes Relative: 7 %
Lymphs Abs: 1.1 10*3/uL (ref 0.7–4.0)
MCH: 33.2 pg (ref 26.0–34.0)
MCHC: 33.4 g/dL (ref 30.0–36.0)
MCV: 99.4 fL (ref 80.0–100.0)
Monocytes Absolute: 0.1 10*3/uL (ref 0.1–1.0)
Monocytes Relative: 1 %
Neutro Abs: 13.2 10*3/uL — ABNORMAL HIGH (ref 1.7–7.7)
Neutrophils Relative %: 92 %
Platelets: 270 10*3/uL (ref 150–400)
RBC: 4.76 MIL/uL (ref 4.22–5.81)
RDW: 15.2 % (ref 11.5–15.5)
WBC: 14.5 10*3/uL — ABNORMAL HIGH (ref 4.0–10.5)
nRBC: 0 % (ref 0.0–0.2)

## 2020-10-07 LAB — MAGNESIUM: Magnesium: 2.2 mg/dL (ref 1.7–2.4)

## 2020-10-07 LAB — PHOSPHORUS: Phosphorus: 3 mg/dL (ref 2.5–4.6)

## 2020-10-07 MED ORDER — GUAIFENESIN ER 600 MG PO TB12: 600.0000 mg | ORAL_TABLET | Freq: Two times a day (BID) | ORAL | 0 refills | Status: DC

## 2020-10-07 MED ORDER — SENNOSIDES-DOCUSATE SODIUM 8.6-50 MG PO TABS: 1.0000 | ORAL_TABLET | Freq: Every evening | ORAL | 0 refills | Status: DC | PRN

## 2020-10-07 MED ORDER — FUROSEMIDE 40 MG PO TABS: 40.0000 mg | ORAL_TABLET | Freq: Every day | ORAL | 0 refills | Status: DC

## 2020-10-07 MED ORDER — DOXYCYCLINE HYCLATE 100 MG PO TABS
100.0000 mg | ORAL_TABLET | Freq: Two times a day (BID) | ORAL | 0 refills | Status: DC
Start: 2020-10-07 — End: 2020-10-07

## 2020-10-07 MED ORDER — DOXYCYCLINE HYCLATE 100 MG PO TABS
100.0000 mg | ORAL_TABLET | Freq: Two times a day (BID) | ORAL | 0 refills | Status: DC
Start: 2020-10-07 — End: 2020-10-18

## 2020-10-07 MED ORDER — CLOPIDOGREL BISULFATE 75 MG PO TABS
75.0000 mg | ORAL_TABLET | Freq: Every day | ORAL | 0 refills | Status: DC
Start: 2020-10-08 — End: 2020-10-07

## 2020-10-07 MED ORDER — GABAPENTIN 100 MG PO CAPS
200.0000 mg | ORAL_CAPSULE | Freq: Two times a day (BID) | ORAL | 0 refills | Status: DC
Start: 2020-10-07 — End: 2020-10-07

## 2020-10-07 MED ORDER — CLOPIDOGREL BISULFATE 75 MG PO TABS
75.0000 mg | ORAL_TABLET | Freq: Every day | ORAL | 0 refills | Status: DC
Start: 2020-10-08 — End: 2020-11-07

## 2020-10-07 MED ORDER — ATORVASTATIN CALCIUM 20 MG PO TABS
20.0000 mg | ORAL_TABLET | Freq: Every day | ORAL | 0 refills | Status: DC
Start: 2020-10-07 — End: 2020-11-07

## 2020-10-07 MED ORDER — ATORVASTATIN CALCIUM 20 MG PO TABS
20.0000 mg | ORAL_TABLET | Freq: Every day | ORAL | 0 refills | Status: DC
Start: 2020-10-07 — End: 2020-10-07

## 2020-10-07 MED ORDER — ISOSORBIDE MONONITRATE ER 30 MG PO TB24: 30.0000 mg | ORAL_TABLET | Freq: Every day | ORAL | 0 refills | Status: DC

## 2020-10-07 MED ORDER — GUAIFENESIN ER 600 MG PO TB12: 600.0000 mg | ORAL_TABLET | Freq: Two times a day (BID) | ORAL | 0 refills | Status: AC

## 2020-10-07 MED ORDER — PREDNISONE 10 MG (21) PO TBPK: ORAL_TABLET | ORAL | 0 refills | Status: DC

## 2020-10-07 MED ORDER — ATORVASTATIN CALCIUM 20 MG PO TABS: 20.0000 mg | ORAL_TABLET | Freq: Every day | ORAL | 0 refills | Status: DC

## 2020-10-07 MED ORDER — GABAPENTIN 100 MG PO CAPS
200.0000 mg | ORAL_CAPSULE | Freq: Two times a day (BID) | ORAL | 0 refills | Status: DC
Start: 2020-10-07 — End: 2020-11-16

## 2020-10-07 MED ORDER — HYDRALAZINE HCL 50 MG PO TABS
50.0000 mg | ORAL_TABLET | Freq: Three times a day (TID) | ORAL | 0 refills | Status: DC
Start: 2020-10-07 — End: 2020-11-07

## 2020-10-07 MED ORDER — HYDRALAZINE HCL 50 MG PO TABS
50.0000 mg | ORAL_TABLET | Freq: Three times a day (TID) | ORAL | 0 refills | Status: DC
Start: 2020-10-07 — End: 2020-10-07

## 2020-10-07 NOTE — Progress Notes (Signed)
Patient discharged, patient alert and oriented, in no distress. Discharge paper and prescriptions given to patient, patient stated he already knows what's on the discharge instruction that I don't need to go over it with him, bus pass provided to patient by CSW and patient taken to the front entrance in a wheelchair.

## 2020-10-07 NOTE — Discharge Summary (Signed)
Physician Discharge Summary  [name] XQJ:[id number] DOB: 1966-03-15 DOA: 10/02/2020  PCP: Patient, No Pcp Per  Admit date: 10/02/2020 Discharge date: 10/07/2020  Admitted From: Home Disposition: Home  Recommendations for Outpatient Follow-up:  1. Follow up with PCP in 1-2 weeks 2. Follow up with Cardiology within 1-2 weeks 3. Follow up with Psychiatry within 1-2 weeks 4. Please obtain CMP/CBC, Mag, Phos in one week 5. Please follow up on the following pending results:  Home Health: N Equipment/Devices: None    Discharge Condition: Stable CODE STATUS: FULL CODE Diet recommendation: Heart Healthy Diet  Brief/Interim Summary: HPI per Dr. Marcial Pacas [name] on 10/02/20 [name] a 54 y.o.malewith medical history significant forchronic combined CHF, cocaine abuse, depression, and hypertension, now presenting to the emergency department for evaluation of shortness of breath and suicidal ideations. Patient was just discharged from the hospital few days ago, has not taken any of his prescription medications since then, has become progressively dyspneic, and his been feeling hopeless and suicidal, indicating that he plans to jump out into traffic. He denies any chest pain, fevers, or chills. He has a cough that he describes as chronic.  ED Course:Upon arrival to the ED, patient is found to be afebrile, saturating 100% on room air, normal respiratory rate, and stable blood pressure. EKG features sinus rhythm with LVH and prolonged QT interval. Chest x-rays negative for acute findings. Chemistry panel with mild elevations in creatinine and total bilirubin that appear stable. Covid screening test not yet collected. Troponin mildly elevated and essentially unchanged when repeated.   **Interim History  Continues to remain volume overloaded and still getting diuresed.  Psychiatry evaluating for further management they felt that the patient would benefit from residential  treatment which was recommended in the past.  They feel that he does not need inpatient psychiatric care at this time.  10/05/2020: Still feels dyspneic today and had minimal wheezing but saturating well without supplemental nasal cannula.  Wanting to be transferred to [facility]  Does not feel like he is improving so cardiology was consulted for further evaluation recommendations.  States that he wants to speak to a psychiatrist again today were also reconsulted.  10/06/2020; cardiology signed off the case as he felt he was euvolemic and changed to p.o. Lasix and started his clopidogrel.  10/07/2020 patient is stable to be discharged and his breathing is much improved.  He was treated for an acute COPD exacerbation and was given steroids and doxycycline and transition to p.o.  No need to follow-up with PCP, cardiology as well as have a referral to psychiatry in the outpatient setting as he is stable for discharge at this time.   Discharge Diagnoses:  Principal Problem:   Acute on chronic combined systolic and diastolic CHF (congestive heart failure) (HCC) Active Problems:   Essential hypertension   Elevated troponin   Suicidal ideation  Acute on chronic combined Systolic and Diastolic CHF -Patient with CHF, cocaine abuse, and medication non-compliance presents with SOB  -Has a history of nonadherence to home medications -Echo from 09/26/20 with EF 40-45% and grade II DD -BNP on Admission was elevated at 1257.1 and repeat yesterday morning was 356.6 -Currently not requiring any supplemental oxygen via nasal cannula -IV diuresis is now stopped and he is changed to p.o. 40 mg of Lasix daily and his diet was changed to regular diet from a heart healthy and he still continues his fluid restriction -Continue to monitor strict I's and O's, daily weights; Patient is - 5.469  L and Repeat Weight is essentially the same as admission today -Continue to monitor electrolytes and renal function while  patient is getting diuresed: BUN/creatinine slightly elevated from yesterday to today and is now 25/1.56 and slightly further worsened to 25/1.59 -Continue to monitor for signs and symptoms of volume overload and repeat a chest x-ray in a.m -Chest x-ray yesterday morning showed "The heart size and mediastinal contours are within normal limits. Both lungs are clear. The visualized skeletal structures are unremarkable."  -Added as needed duo nebs for breathing given his slight wheezing -Cardiology was consulted and has signed off the case as he is now changed to p.o. Lasix  Acute Exacerbation of COPD -Has been adequately diuresed and continued to wheeze some  -Started doxycycline 100 mg p.o. every 12 for 5 days  -Also continue with Xopenex/Atrovent scheduled every 6  -Started Solu-Medrol 60 mg every 12 next-continue guaifenesin 1200 mg p.o. twice daily, flutter valve, and incentive spirometry -Need to ambulate the patient prior to discharge to ensure that he does not desaturate -Currently not requiring supplemental oxygen via nasal cannula -Steroids and antibiotics were changed to p.o.  Hyperbilirubinemia -Patient's T Bili was 1.9 and trended down to 0.5 and normalized -Likely Reactive -Continue to Monitor and Trend carefully -Repeat CMP within 1 week  Suicidal ideationin a Hx of Schizoaffective Disorder and Bipolar 1 Disorder -Patient reports feelings of hopelessness with frequent SI, planned to jump into trafficbut Psych evaluated and felt he was not really Suicidial  -Continued Suicide precautions and 1:1 sitter but removed now that he is cleared by Psych -Psychiatry recommending Residential Treatment and the patient does not need inpatient psychiatric care -Patient wanted to speak with psychiatry again so they were reconsulted and now they have signed off and recommending outpatient resources and TOC to provide them  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 -Patient was Positive for Cocaine again on UDS -Cocain and and Tobacco Counseling given for cessation  Chronic kidney disease stage IIIa, stable improved Metabolic Acidosis, mild -Baseline creatinine recently has been ranging from 1.2-1.5 the last few years -BUN/creatinine is now stable after diuresis and is 20/1.33 -Metabolic acidosis is mild at the time of discharge with a CO2 of 21, anion gap of 10, chloride level 104 -avoid further nephrotoxic medications, contrast dyes, hypotension and renally adjust medications  -Continue monitor and trend renal function and repeat CMP in a.m as is Lasix has been changed to p.o.  Essential Hypertension -Continue IV Lasix as above, and Imdur 30 mg po Daily and Hydralazine25 mg TID was increased to 50 mg 3 times daily by cardiology -Continue to monitor blood pressures per protocol -Last blood pressure was 131/73 -we will add IV hydralazine 10 mg every 6 hours as needed for systolic blood pressure greater than 180 or diastolic blood pressure greater than 100   Hyperlipidemia -C/w Atorvastatin 20 mg po qHS  Elevated Troponin CAD -HS troponin mildly elevated in ED without anginal complaints -troponin went from 71 and is now 78 -Likely related to cocaine abuse and CHF, no further inpatient workup anticipated -Audiology started the patient on clopidogrel -Continues to be dyspneic so cardiology was consulted for further evaluation recommendations  Prolonged QT interval -Minimize QT-prolonging medications, monitor electrolytes and replace as needed -Patient's K+ is 4.4 and magnesium is 2.2 at the time of Discharge  Hyperglycemia in the setting of Pre-Diabetes -Patient blood sugar has been ranging from 99-171 on BMP/CMP  -Hemoglobin A1c was 5.9 almost a month ago -Continue  to monitor blood sugars carefully and if necessary will place on sensitive NovoLog/scale insulin AC  Leukocytosis Patient WBC went from 6.4 and is now  14.5 In the setting of steroid demargination Plan continue to monitor and trend in the outpatient setting and continue with steroid taper discharge   Discharge Instructions  Discharge Instructions    (HEART FAILURE PATIENTS) Call MD:  Anytime you have any of the following symptoms: 1) 3 pound weight gain in 24 hours or 5 pounds in 1 week 2) shortness of breath, with or without a dry hacking cough 3) swelling in the hands, feet or stomach 4) if you have to sleep on extra pillows at night in order to breathe.   Complete by: As directed    Call MD for:  difficulty breathing, headache or visual disturbances   Complete by: As directed    Call MD for:  extreme fatigue   Complete by: As directed    Call MD for:  hives   Complete by: As directed    Call MD for:  persistant dizziness or light-headedness   Complete by: As directed    Call MD for:  persistant nausea and vomiting   Complete by: As directed    Call MD for:  redness, tenderness, or signs of infection (pain, swelling, redness, odor or green/yellow discharge around incision site)   Complete by: As directed    Call MD for:  severe uncontrolled pain   Complete by: As directed    Call MD for:  temperature >100.4   Complete by: As directed    Diet - low sodium heart healthy   Complete by: As directed    Discharge instructions   Complete by: As directed    You were cared for by a hospitalist during your hospital stay. If you have any questions about your discharge medications or the care you received while you were in the hospital after you are discharged, you can call the unit and ask to speak with the hospitalist on call if the hospitalist that took care of you is not available. Once you are discharged, your primary care physician will handle any further medical issues. Please note that NO REFILLS for any discharge medications will be authorized once you are discharged, as it is imperative that you return to your primary care physician  (or establish a relationship with a primary care physician if you do not have one) for your aftercare needs so that they can reassess your need for medications and monitor your lab values.  Follow up with PCP, Cardiology, and have a referral for Pulmonary. Take all medications as prescribed and remain compliant and avoid illicit substances. If symptoms change or worsen please return to the ED for evaluation   Increase activity slowly   Complete by: As directed      Allergies as of 10/07/2020      Reactions   Ibuprofen Anaphylaxis   Acetaminophen Hives, Nausea And Vomiting, Rash   Pt overdosed on bottle of APAP 05/06/16      Medication List    TAKE these medications   albuterol 108 (90 Base) MCG/ACT inhaler Commonly known as: VENTOLIN HFA Inhale 2 puffs into the lungs every 6 (six) hours as needed for wheezing or shortness of breath.   atorvastatin 20 MG tablet Commonly known as: LIPITOR Take 1 tablet (20 mg total) by mouth daily.   clopidogrel 75 MG tablet Commonly known as: PLAVIX Take 1 tablet (75 mg total) by mouth daily. Start  taking on: October 08, 2020   doxycycline 100 MG tablet Commonly known as: VIBRA-TABS Take 1 tablet (100 mg total) by mouth every 12 (twelve) hours.   furosemide 40 MG tablet Commonly known as: Lasix Take 1 tablet (40 mg total) by mouth daily.   gabapentin 100 MG capsule Commonly known as: NEURONTIN Take 2 capsules (200 mg total) by mouth 2 (two) times daily.   guaiFENesin 600 MG 12 hr tablet Commonly known as: MUCINEX Take 1 tablet (600 mg total) by mouth 2 (two) times daily for 5 days.   hydrALAZINE 50 MG tablet Commonly known as: APRESOLINE Take 1 tablet (50 mg total) by mouth every 8 (eight) hours. What changed:   medication strength  how much to take   isosorbide mononitrate 30 MG 24 hr tablet Commonly known as: IMDUR Take 1 tablet (30 mg total) by mouth daily.   neomycin-bacitracin-polymyxin ointment Commonly known as:  NEOSPORIN Apply topically 2 (two) times daily.   predniSONE 10 MG (21) Tbpk tablet Commonly known as: STERAPRED UNI-PAK 21 TAB Take 6 pills on day 1, 5 pills on day 2, 4 pills on day 3, 3 pills on day 4, 2 pills on day 5, 1 pill on day 6 and then stop on day 7   senna-docusate 8.6-50 MG tablet Commonly known as: Senokot-S Take 1 tablet by mouth at bedtime as needed for mild constipation.       Follow-up Information    Little Ishikawa, MD Follow up.   Specialties: Cardiology, Radiology Why: [facility] - [facility] office - will call you with appointment information. Contact information: [address] [name] [address] [phone]              Allergies  Allergen Reactions   Ibuprofen Anaphylaxis   Acetaminophen Hives, Nausea And Vomiting and Rash    Pt overdosed on bottle of APAP 05/06/16     Consultations:  Cardiology  Procedures/Studies: DG CHEST PORT 1 VIEW  Result Date: 10/06/2020 CLINICAL DATA:  Shortness of breath. EXAM: PORTABLE CHEST 1 VIEW COMPARISON:  10/04/2020 FINDINGS: The heart size and mediastinal contours are within normal limits. Both lungs are clear. The visualized skeletal structures are unremarkable. IMPRESSION: No active disease. Electronically Signed   By: Signa Kell M.D.   On: 10/06/2020 06:36   DG CHEST PORT 1 VIEW  Result Date: 10/04/2020 CLINICAL DATA:  Dyspnea. EXAM: PORTABLE CHEST 1 VIEW COMPARISON:  Prior chest radiograph 10/02/2020. FINDINGS: Unchanged cardiomegaly. No appreciable airspace consolidation or pulmonary edema. No evidence of pleural effusion or pneumothorax. No acute bony abnormality identified. IMPRESSION: Cardiomegaly.  No evidence of acute cardiopulmonary abnormality. Electronically Signed   By: Jackey Loge DO   On: 10/04/2020 08:37   DG Chest Portable 1 View  Result Date: 10/02/2020 CLINICAL DATA:  Shortness of breath EXAM: PORTABLE CHEST 1 VIEW COMPARISON:  09/25/2020 FINDINGS:  Cardiomegaly. No confluent opacities, effusions or edema. No acute bony abnormality. IMPRESSION: Cardiomegaly.  No active disease. Electronically Signed   By: Charlett Nose M.D.   On: 10/02/2020 19:01   DG Chest Portable 1 View  Result Date: 09/25/2020 CLINICAL DATA:  Shortness of breath. EXAM: PORTABLE CHEST 1 VIEW COMPARISON:  09/07/2020 FINDINGS: The lungs are clear without focal pneumonia, edema, pneumothorax or pleural effusion. Interstitial markings are diffusely coarsened with chronic features. Cardiopericardial silhouette is at upper limits of normal for size. The visualized bony structures of the thorax show no acute abnormality. Telemetry leads overlie the chest. IMPRESSION: Borderline cardiomegaly.  No acute cardiopulmonary findings. Electronically Signed   By: Kennith Center M.D.   On: 09/25/2020 06:44   DG Toe Great Right  Result Date: 09/25/2020 CLINICAL DATA:  Wound on the right great toe. EXAM: RIGHT GREAT TOE COMPARISON:  No comparison studies available. FINDINGS: No fracture. No subluxation or dislocation. No evidence for lucent lesion in the great toe. No perceptible cortical loss in the anatomy of the great toe. IMPRESSION: No radiographic features of osteomyelitis in the right great toe. Electronically Signed   By: Kennith Center M.D.   On: 09/25/2020 07:41   ECHOCARDIOGRAM COMPLETE  Result Date: 09/26/2020    ECHOCARDIOGRAM REPORT   Patient Name:   [name] Date of Exam: 09/26/2020 Medical Rec #:  [id number]      Height:       69.0 in Accession #:    [id number]     Weight:       192.2 lb Date of Birth:  04-11-1966      BSA:          2.032 m Patient Age:    54 years       BP:           148/105 mmHg Patient Gender: M              HR:           60 bpm. Exam Location:  Inpatient Procedure: 2D Echo, 3D Echo, Color Doppler and Cardiac Doppler Indications:    R07.9* Chest pain, unspecified  History:        Patient has prior history of Echocardiogram examinations, most                  recent 07/22/2020. CHF, COPD; Risk Factors:Hypertension and                 Polysubstance abuse.  Sonographer:    Irving Burton Senior RDCS Referring Phys: [phone] EKTA V PATEL IMPRESSIONS  1. Left ventricular ejection fraction, by estimation, is 40 to 45%. The left ventricle has mildly decreased function. The left ventricle demonstrates global hypokinesis. The left ventricular internal cavity size was mildly dilated. There is mild concentric left ventricular hypertrophy. Left ventricular diastolic parameters are consistent with Grade II diastolic dysfunction (pseudonormalization).  2. Right ventricular systolic function is normal. The right ventricular size is normal.  3. Left atrial size was moderately dilated.  4. Right atrial size was mildly dilated.  5. The mitral valve is normal in structure. Trivial mitral valve regurgitation. No evidence of mitral stenosis.  6. The aortic valve is tricuspid. There is mild calcification of the aortic valve. Aortic valve regurgitation is not visualized. Mild aortic valve sclerosis is present, with no evidence of aortic valve stenosis.  7. The inferior vena cava is normal in size with greater than 50% respiratory variability, suggesting right atrial pressure of 3 mmHg. Comparison(s): No significant change from prior study. FINDINGS  Left Ventricle: Left ventricular ejection fraction, by estimation, is 40 to 45%. The left ventricle has mildly decreased function. The left ventricle demonstrates global hypokinesis. The left ventricular internal cavity size was mildly dilated. There is  mild concentric left ventricular hypertrophy. Left ventricular diastolic parameters are consistent with Grade II diastolic dysfunction (pseudonormalization). Right Ventricle: The right ventricular size is normal. No increase in right ventricular wall thickness. Right ventricular systolic function is normal. Left Atrium: Left atrial size was moderately dilated. Right Atrium: Right atrial size was mildly  dilated. Pericardium: Trivial pericardial effusion is present.  Mitral Valve: The mitral valve is normal in structure. Trivial mitral valve regurgitation. No evidence of mitral valve stenosis. Tricuspid Valve: The tricuspid valve is normal in structure. Tricuspid valve regurgitation is trivial. No evidence of tricuspid stenosis. Aortic Valve: The aortic valve is tricuspid. There is mild calcification of the aortic valve. Aortic valve regurgitation is not visualized. Mild aortic valve sclerosis is present, with no evidence of aortic valve stenosis. Pulmonic Valve: The pulmonic valve was grossly normal. Pulmonic valve regurgitation is not visualized. No evidence of pulmonic stenosis. Aorta: The aortic root, ascending aorta and aortic arch are all structurally normal, with no evidence of dilitation or obstruction. Venous: The inferior vena cava is normal in size with greater than 50% respiratory variability, suggesting right atrial pressure of 3 mmHg. IAS/Shunts: The atrial septum is grossly normal.  LEFT VENTRICLE PLAX 2D LVIDd:         5.60 cm  Diastology LVIDs:         4.30 cm  LV e' medial:    6.42 cm/s LV PW:         1.10 cm  LV E/e' medial:  16.8 LV IVS:        1.20 cm  LV e' lateral:   5.55 cm/s LVOT diam:     2.00 cm  LV E/e' lateral: 19.5 LV SV:         64 LV SV Index:   31 LVOT Area:     3.14 cm                          3D Volume EF:                         3D EF:        39 % RIGHT VENTRICLE RV S prime:     9.79 cm/s TAPSE (M-mode): 1.9 cm LEFT ATRIUM             Index       RIGHT ATRIUM           Index LA diam:        4.50 cm 2.22 cm/m  RA Area:     20.50 cm LA Vol (A2C):   89.1 ml 43.86 ml/m RA Volume:   57.50 ml  28.30 ml/m LA Vol (A4C):   99.2 ml 48.83 ml/m LA Biplane Vol: 95.5 ml 47.01 ml/m  AORTIC VALVE LVOT Vmax:   99.90 cm/s LVOT Vmean:  67.500 cm/s LVOT VTI:    0.203 m  AORTA Ao Root diam: 3.00 cm Ao Asc diam:  3.20 cm MITRAL VALVE MV Area (PHT): 2.80 cm     SHUNTS MV Decel Time: 271 msec      Systemic VTI:  0.20 m MV E velocity: 108.00 cm/s  Systemic Diam: 2.00 cm MV A velocity: 66.00 cm/s MV E/A ratio:  1.64 Jodelle Red MD Electronically signed by Jodelle Red MD Signature Date/Time: 09/26/2020/12:20:39 PM    Final      Subjective: Seen and examined at bedside and patient was doing much better today and back to baseline and felt that his breathing is tremendously better since coming in.  No chest pain, lightheadedness or dizziness.  Thinks his legs are less swollen.  Feels well and ready to be discharged.  Discharge Exam: Vitals:   10/07/20 0545 10/07/20 0740  BP: (!) 148/100   Pulse: 71   Resp: 20   Temp: 98.1  F (36.7 C)   SpO2: 97% 98%   Vitals:   10/06/20 2023 10/06/20 2134 10/07/20 0545 10/07/20 0740  BP: 139/73  (!) 148/100   Pulse: 96  71   Resp: 20  20   Temp: 97.8 F (36.6 C)  98.1 F (36.7 C)   TempSrc: Oral  Oral   SpO2: 97% 100% 97% 98%  Weight:   86.7 kg   Height:        General: Pt is alert, awake, not in acute distress Cardiovascular: RRR, S1/S2 +, no rubs, no gallops Respiratory: CTA bilaterally, no wheezing, no rhonchi Abdominal: S diminished oft, NT, slightly distended, bowel sounds + Extremities: Trace edema, no cyanosis  The results of significant diagnostics from this hospitalization (including imaging, microbiology, ancillary and laboratory) are listed below for reference.    Microbiology: Recent Results (from the past 240 hour(s))  Resp Panel by RT PCR (RSV, Flu A&B, Covid) - Nasopharyngeal Swab     Status: None   Collection Time: 10/02/20  9:32 PM   Specimen: Nasopharyngeal Swab  Result Value Ref Range Status   SARS Coronavirus 2 by RT PCR NEGATIVE NEGATIVE Final    Comment: (NOTE) SARS-CoV-2 target nucleic acids are NOT DETECTED.  The SARS-CoV-2 RNA is generally detectable in upper respiratoy specimens during the acute phase of infection. The lowest concentration of SARS-CoV-2 viral copies this assay can detect  is 131 copies/mL. A negative result does not preclude SARS-Cov-2 infection and should not be used as the sole basis for treatment or other patient management decisions. A negative result may occur with  improper specimen collection/handling, submission of specimen other than nasopharyngeal swab, presence of viral mutation(s) within the areas targeted by this assay, and inadequate number of viral copies (<131 copies/mL). A negative result must be combined with clinical observations, patient history, and epidemiological information. The expected result is Negative.  Fact Sheet for Patients:  https://www.moore.com/  Fact Sheet for Healthcare Providers:  https://www.young.biz/  This test is no t yet approved or cleared by the [place] FDA and  has been authorized for detection and/or diagnosis of SARS-CoV-2 by FDA under an Emergency Use Authorization (EUA). This EUA will remain  in effect (meaning this test can be used) for the duration of the COVID-19 declaration under Section 564(b)(1) of the Act, 21 U.S.C. section 360bbb-3(b)(1), unless the authorization is terminated or revoked sooner.     Influenza A by PCR NEGATIVE NEGATIVE Final   Influenza B by PCR NEGATIVE NEGATIVE Final    Comment: (NOTE) The Xpert Xpress SARS-CoV-2/FLU/RSV assay is intended as an aid in  the diagnosis of influenza from Nasopharyngeal swab specimens and  should not be used as a sole basis for treatment. Nasal washings and  aspirates are unacceptable for Xpert Xpress SARS-CoV-2/FLU/RSV  testing.  Fact Sheet for Patients: https://www.moore.com/  Fact Sheet for Healthcare Providers: https://www.young.biz/  This test is not yet approved or cleared by the [place] FDA and  has been authorized for detection and/or diagnosis of SARS-CoV-2 by  FDA under an Emergency Use Authorization (EUA). This EUA will remain  in effect  (meaning this test can be used) for the duration of the  Covid-19 declaration under Section 564(b)(1) of the Act, 21  U.S.C. section 360bbb-3(b)(1), unless the authorization is  terminated or revoked.    Respiratory Syncytial Virus by PCR NEGATIVE NEGATIVE Final    Comment: (NOTE) Fact Sheet for Patients: https://www.moore.com/  Fact Sheet for Healthcare Providers: https://www.young.biz/  This test is not  yet approved or cleared by the [place] and  has been authorized for detection and/or diagnosis of SARS-CoV-2 by  FDA under an Emergency Use Authorization (EUA). This EUA will remain  in effect (meaning this test can be used) for the duration of the  COVID-19 declaration under Section 564(b)(1) of the Act, 21 U.S.C.  section 360bbb-3(b)(1), unless the authorization is terminated or  revoked. Performed at [facility], [address], [place], [name] [address]      Labs: BNP (last 3 results) Recent Labs    10/02/20 1945 10/04/20 0840 10/05/20 0515  BNP 1,257.1* 325.5* 356.6*   Basic Metabolic Panel: Recent Labs  Lab 10/03/20 0822 10/04/20 0840 10/05/20 0515 10/06/20 0721 10/07/20 0647  NA 139 139 136 138 135  K 4.1 3.9 3.8 3.8 4.4  CL 106 105 102 104 104  CO2 22 23 19* 22 21*  GLUCOSE 99 171* 111* 113* 337*  BUN 22* 25* 25* 20 20  CREATININE 1.43* 1.56* 1.59* 1.33* 1.13  CALCIUM 8.8* 8.7* 8.6* 9.1 9.7  MG 2.4 2.2 2.0 2.1 2.2  PHOS 4.0 4.6 4.1 4.4 3.0   Liver Function Tests: Recent Labs  Lab 10/03/20 0822 10/04/20 0840 10/05/20 0515 10/06/20 0721 10/07/20 0647  AST 26 21 15 16 20   ALT 24 22 17 17 20   ALKPHOS 84 75 65 65 71  BILITOT 1.6* 1.0 0.8 0.6 0.5  PROT 6.7 6.7 6.3* 6.9 7.7  ALBUMIN 3.7 3.5 3.4* 3.9 3.9   No results for input(s): LIPASE, AMYLASE in the last 168 hours. No results for input(s): AMMONIA in the last 168 hours. CBC: Recent Labs  Lab 10/03/20 0822 10/04/20 0840  10/05/20 0515 10/06/20 0721 10/07/20 0647  WBC 6.0 5.0 6.2 6.4 14.5*  NEUTROABS 2.8 2.3 2.8 3.0 13.2*  HGB 15.0 15.0 14.6 15.5 15.8  HCT 44.5 45.1 44.3 46.7 47.3  MCV 98.0 98.3 100.2* 99.8 99.4  PLT 276 244 244 254 270   Cardiac Enzymes: No results for input(s): CKTOTAL, CKMB, CKMBINDEX, TROPONINI in the last 168 hours. BNP: Invalid input(s): POCBNP CBG: No results for input(s): GLUCAP in the last 168 hours. D-Dimer No results for input(s): DDIMER in the last 72 hours. Hgb A1c No results for input(s): HGBA1C in the last 72 hours. Lipid Profile No results for input(s): CHOL, HDL, LDLCALC, TRIG, CHOLHDL, LDLDIRECT in the last 72 hours. Thyroid function studies No results for input(s): TSH, T4TOTAL, T3FREE, THYROIDAB in the last 72 hours.  Invalid input(s): FREET3 Anemia work up No results for input(s): VITAMINB12, FOLATE, FERRITIN, TIBC, IRON, RETICCTPCT in the last 72 hours. Urinalysis    Component Value Date/Time   COLORURINE YELLOW 08/22/2020 1127   APPEARANCEUR CLEAR 08/22/2020 1127   LABSPEC 1.010 08/22/2020 1127   PHURINE 5.0 08/22/2020 1127   GLUCOSEU NEGATIVE 08/22/2020 1127   HGBUR NEGATIVE 08/22/2020 1127   BILIRUBINUR NEGATIVE 08/22/2020 1127   KETONESUR NEGATIVE 08/22/2020 1127   PROTEINUR NEGATIVE 08/22/2020 1127   NITRITE NEGATIVE 08/22/2020 1127   LEUKOCYTESUR NEGATIVE 08/22/2020 1127   Sepsis Labs Invalid input(s): PROCALCITONIN,  WBC,  LACTICIDVEN Microbiology Recent Results (from the past 240 hour(s))  Resp Panel by RT PCR (RSV, Flu A&B, Covid) - Nasopharyngeal Swab     Status: None   Collection Time: 10/02/20  9:32 PM   Specimen: Nasopharyngeal Swab  Result Value Ref Range Status   SARS Coronavirus 2 by RT PCR NEGATIVE NEGATIVE Final    Comment: (NOTE) SARS-CoV-2 target nucleic acids are  NOT DETECTED.  The SARS-CoV-2 RNA is generally detectable in upper respiratoy specimens during the acute phase of infection. The lowest concentration of  SARS-CoV-2 viral copies this assay can detect is 131 copies/mL. A negative result does not preclude SARS-Cov-2 infection and should not be used as the sole basis for treatment or other patient management decisions. A negative result may occur with  improper specimen collection/handling, submission of specimen other than nasopharyngeal swab, presence of viral mutation(s) within the areas targeted by this assay, and inadequate number of viral copies (<131 copies/mL). A negative result must be combined with clinical observations, patient history, and epidemiological information. The expected result is Negative.  Fact Sheet for Patients:  https://www.moore.com/  Fact Sheet for Healthcare Providers:  https://www.young.biz/  This test is no t yet approved or cleared by the [place] FDA and  has been authorized for detection and/or diagnosis of SARS-CoV-2 by FDA under an Emergency Use Authorization (EUA). This EUA will remain  in effect (meaning this test can be used) for the duration of the COVID-19 declaration under Section 564(b)(1) of the Act, 21 U.S.C. section 360bbb-3(b)(1), unless the authorization is terminated or revoked sooner.     Influenza A by PCR NEGATIVE NEGATIVE Final   Influenza B by PCR NEGATIVE NEGATIVE Final    Comment: (NOTE) The Xpert Xpress SARS-CoV-2/FLU/RSV assay is intended as an aid in  the diagnosis of influenza from Nasopharyngeal swab specimens and  should not be used as a sole basis for treatment. Nasal washings and  aspirates are unacceptable for Xpert Xpress SARS-CoV-2/FLU/RSV  testing.  Fact Sheet for Patients: https://www.moore.com/  Fact Sheet for Healthcare Providers: https://www.young.biz/  This test is not yet approved or cleared by the [place] FDA and  has been authorized for detection and/or diagnosis of SARS-CoV-2 by  FDA under an Emergency Use  Authorization (EUA). This EUA will remain  in effect (meaning this test can be used) for the duration of the  Covid-19 declaration under Section 564(b)(1) of the Act, 21  U.S.C. section 360bbb-3(b)(1), unless the authorization is  terminated or revoked.    Respiratory Syncytial Virus by PCR NEGATIVE NEGATIVE Final    Comment: (NOTE) Fact Sheet for Patients: https://www.moore.com/  Fact Sheet for Healthcare Providers: https://www.young.biz/  This test is not yet approved or cleared by the [place] FDA and  has been authorized for detection and/or diagnosis of SARS-CoV-2 by  FDA under an Emergency Use Authorization (EUA). This EUA will remain  in effect (meaning this test can be used) for the duration of the  COVID-19 declaration under Section 564(b)(1) of the Act, 21 U.S.C.  section 360bbb-3(b)(1), unless the authorization is terminated or  revoked. Performed at [facility], [address], [place], [name] [address]     Time coordinating discharge: 35 minutes  SIGNED:  Marguerita Merles, DO [facility] 10/07/2020, 11:26 AM Pager is on AMION  If 7PM-7AM, please contact night-coverage [email or web address]

## 2020-10-14 ENCOUNTER — Emergency Department (HOSPITAL_COMMUNITY): Payer: Self-pay

## 2020-10-14 ENCOUNTER — Other Ambulatory Visit: Payer: Self-pay

## 2020-10-14 ENCOUNTER — Inpatient Hospital Stay (HOSPITAL_COMMUNITY)
Admission: EM | Admit: 2020-10-14 | Discharge: 2020-10-18 | DRG: 291 | Disposition: A | Discharge status: Home / Self Care | Payer: Self-pay | Attending: Internal Medicine | Admitting: Internal Medicine

## 2020-10-14 ENCOUNTER — Encounter (HOSPITAL_COMMUNITY): Payer: Self-pay | Admitting: Obstetrics and Gynecology

## 2020-10-14 DIAGNOSIS — F1410 Cocaine abuse, uncomplicated: Secondary | ICD-10-CM | POA: Diagnosis present

## 2020-10-14 DIAGNOSIS — I509 Heart failure, unspecified: Secondary | ICD-10-CM

## 2020-10-14 DIAGNOSIS — J441 Chronic obstructive pulmonary disease with (acute) exacerbation: Secondary | ICD-10-CM

## 2020-10-14 DIAGNOSIS — F32A Depression, unspecified: Secondary | ICD-10-CM | POA: Diagnosis present

## 2020-10-14 DIAGNOSIS — J9601 Acute respiratory failure with hypoxia: Secondary | ICD-10-CM

## 2020-10-14 DIAGNOSIS — G629 Polyneuropathy, unspecified: Secondary | ICD-10-CM | POA: Diagnosis present

## 2020-10-14 DIAGNOSIS — Z7902 Long term (current) use of antithrombotics/antiplatelets: Secondary | ICD-10-CM

## 2020-10-14 DIAGNOSIS — I110 Hypertensive heart disease with heart failure: Principal | ICD-10-CM | POA: Diagnosis present

## 2020-10-14 DIAGNOSIS — Z7952 Long term (current) use of systemic steroids: Secondary | ICD-10-CM

## 2020-10-14 DIAGNOSIS — E1165 Type 2 diabetes mellitus with hyperglycemia: Secondary | ICD-10-CM | POA: Diagnosis present

## 2020-10-14 DIAGNOSIS — I10 Essential (primary) hypertension: Secondary | ICD-10-CM | POA: Diagnosis present

## 2020-10-14 DIAGNOSIS — I248 Other forms of acute ischemic heart disease: Secondary | ICD-10-CM | POA: Diagnosis present

## 2020-10-14 DIAGNOSIS — R9431 Abnormal electrocardiogram [ECG] [EKG]: Secondary | ICD-10-CM | POA: Diagnosis present

## 2020-10-14 DIAGNOSIS — J449 Chronic obstructive pulmonary disease, unspecified: Secondary | ICD-10-CM

## 2020-10-14 DIAGNOSIS — Z20822 Contact with and (suspected) exposure to covid-19: Secondary | ICD-10-CM | POA: Diagnosis present

## 2020-10-14 DIAGNOSIS — F250 Schizoaffective disorder, bipolar type: Secondary | ICD-10-CM | POA: Diagnosis present

## 2020-10-14 DIAGNOSIS — I5043 Acute on chronic combined systolic (congestive) and diastolic (congestive) heart failure: Secondary | ICD-10-CM | POA: Diagnosis present

## 2020-10-14 DIAGNOSIS — Z5900 Homelessness unspecified: Secondary | ICD-10-CM

## 2020-10-14 DIAGNOSIS — F17210 Nicotine dependence, cigarettes, uncomplicated: Secondary | ICD-10-CM | POA: Diagnosis present

## 2020-10-14 DIAGNOSIS — F17200 Nicotine dependence, unspecified, uncomplicated: Secondary | ICD-10-CM | POA: Diagnosis present

## 2020-10-14 DIAGNOSIS — K219 Gastro-esophageal reflux disease without esophagitis: Secondary | ICD-10-CM | POA: Diagnosis present

## 2020-10-14 DIAGNOSIS — Z9119 Patient's noncompliance with other medical treatment and regimen: Secondary | ICD-10-CM

## 2020-10-14 DIAGNOSIS — Z79899 Other long term (current) drug therapy: Secondary | ICD-10-CM

## 2020-10-14 HISTORY — DX: Type 2 diabetes mellitus without complications: E11.9

## 2020-10-14 HISTORY — DX: Chronic obstructive pulmonary disease, unspecified: J44.9

## 2020-10-14 HISTORY — DX: Gastro-esophageal reflux disease without esophagitis: K21.9

## 2020-10-14 LAB — BASIC METABOLIC PANEL
Anion gap: 9 (ref 5–15)
BUN: 14 mg/dL (ref 6–20)
CO2: 23 mmol/L (ref 22–32)
Calcium: 8.5 mg/dL — ABNORMAL LOW (ref 8.9–10.3)
Chloride: 109 mmol/L (ref 98–111)
Creatinine, Ser: 1.37 mg/dL — ABNORMAL HIGH (ref 0.61–1.24)
GFR, Estimated: >60 mL/min (ref >60)
Glucose, Bld: 183 mg/dL — ABNORMAL HIGH (ref 70–99)
Potassium: 4.1 mmol/L (ref 3.5–5.1)
Sodium: 141 mmol/L (ref 135–145)

## 2020-10-14 LAB — CBC WITH DIFFERENTIAL/PLATELET
Abs Immature Granulocytes: 0.02 10*3/uL (ref 0.00–0.07)
Basophils Absolute: 0 10*3/uL (ref 0.0–0.1)
Basophils Relative: 1 %
Eosinophils Absolute: 0 10*3/uL (ref 0.0–0.5)
Eosinophils Relative: 1 %
HCT: 39 % (ref 39.0–52.0)
Hemoglobin: 13.1 g/dL (ref 13.0–17.0)
Immature Granulocytes: 0 %
Lymphocytes Relative: 29 %
Lymphs Abs: 2.2 10*3/uL (ref 0.7–4.0)
MCH: 33.2 pg (ref 26.0–34.0)
MCHC: 33.6 g/dL (ref 30.0–36.0)
MCV: 98.7 fL (ref 80.0–100.0)
Monocytes Absolute: 0.8 10*3/uL (ref 0.1–1.0)
Monocytes Relative: 10 %
Neutro Abs: 4.6 10*3/uL (ref 1.7–7.7)
Neutrophils Relative %: 59 %
Platelets: 232 10*3/uL (ref 150–400)
RBC: 3.95 MIL/uL — ABNORMAL LOW (ref 4.22–5.81)
RDW: 15.9 % — ABNORMAL HIGH (ref 11.5–15.5)
WBC: 7.7 10*3/uL (ref 4.0–10.5)
nRBC: 0 % (ref 0.0–0.2)

## 2020-10-14 LAB — TROPONIN I (HIGH SENSITIVITY)
Troponin I (High Sensitivity): 39 ng/L — ABNORMAL HIGH (ref <18)
Troponin I (High Sensitivity): 36 ng/L — ABNORMAL HIGH (ref <18)

## 2020-10-14 LAB — BRAIN NATRIURETIC PEPTIDE: B Natriuretic Peptide: 1183.4 pg/mL — ABNORMAL HIGH (ref 0.0–100.0)

## 2020-10-14 LAB — RESPIRATORY PANEL BY RT PCR (FLU A&B, COVID)
Influenza A by PCR: NEGATIVE
Influenza B by PCR: NEGATIVE
SARS Coronavirus 2 by RT PCR: NEGATIVE

## 2020-10-14 LAB — MAGNESIUM: Magnesium: 2.2 mg/dL (ref 1.7–2.4)

## 2020-10-14 IMAGING — CR DG CHEST 2V
2 series · 2 of 2 positions shown · non-contrast
Comparison: Chest radiograph dated [DATE].

CLINICAL DATA: 54-year-old male with shortness of breath.

EXAM:
CHEST - 2 VIEW

[w chest lat]
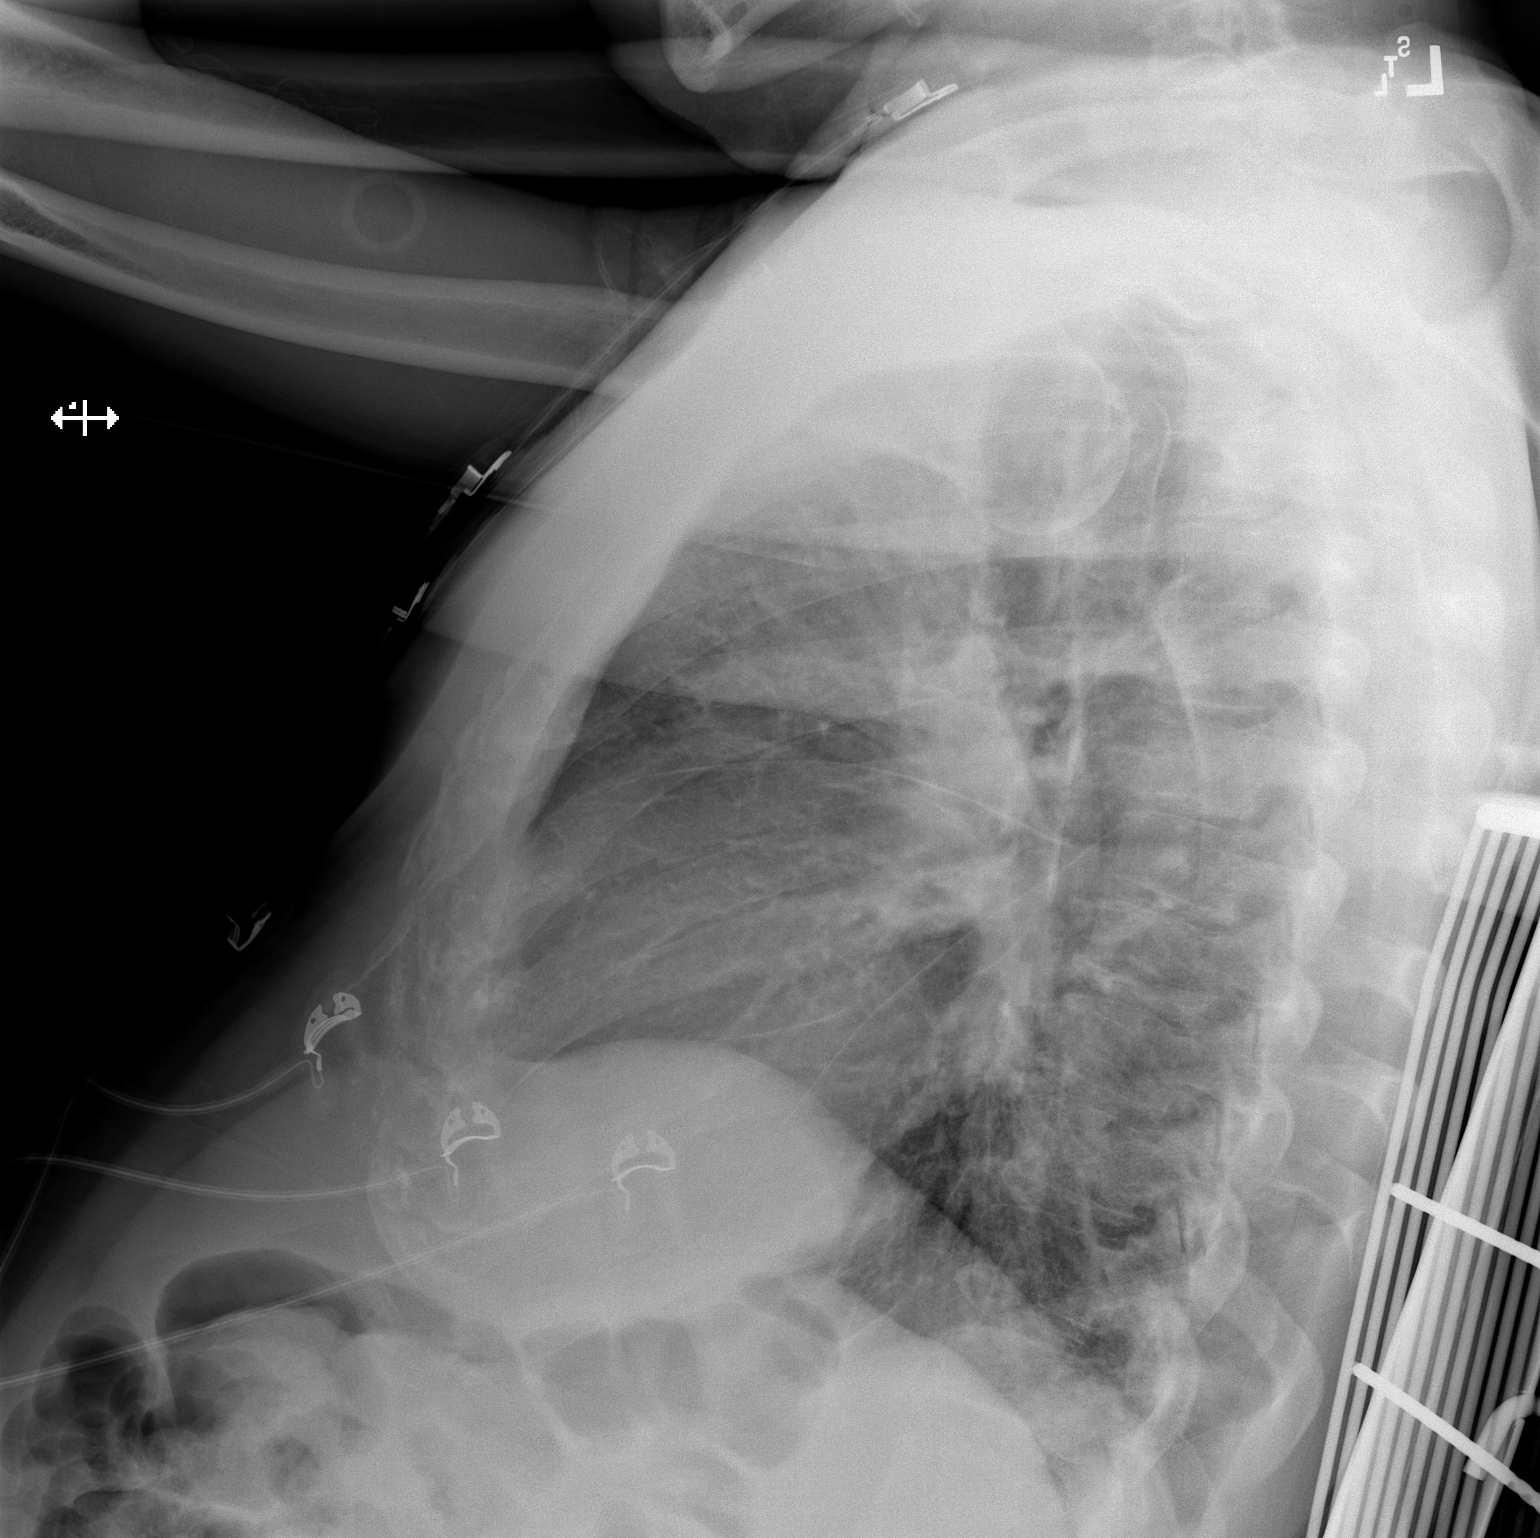

[x chest ap]
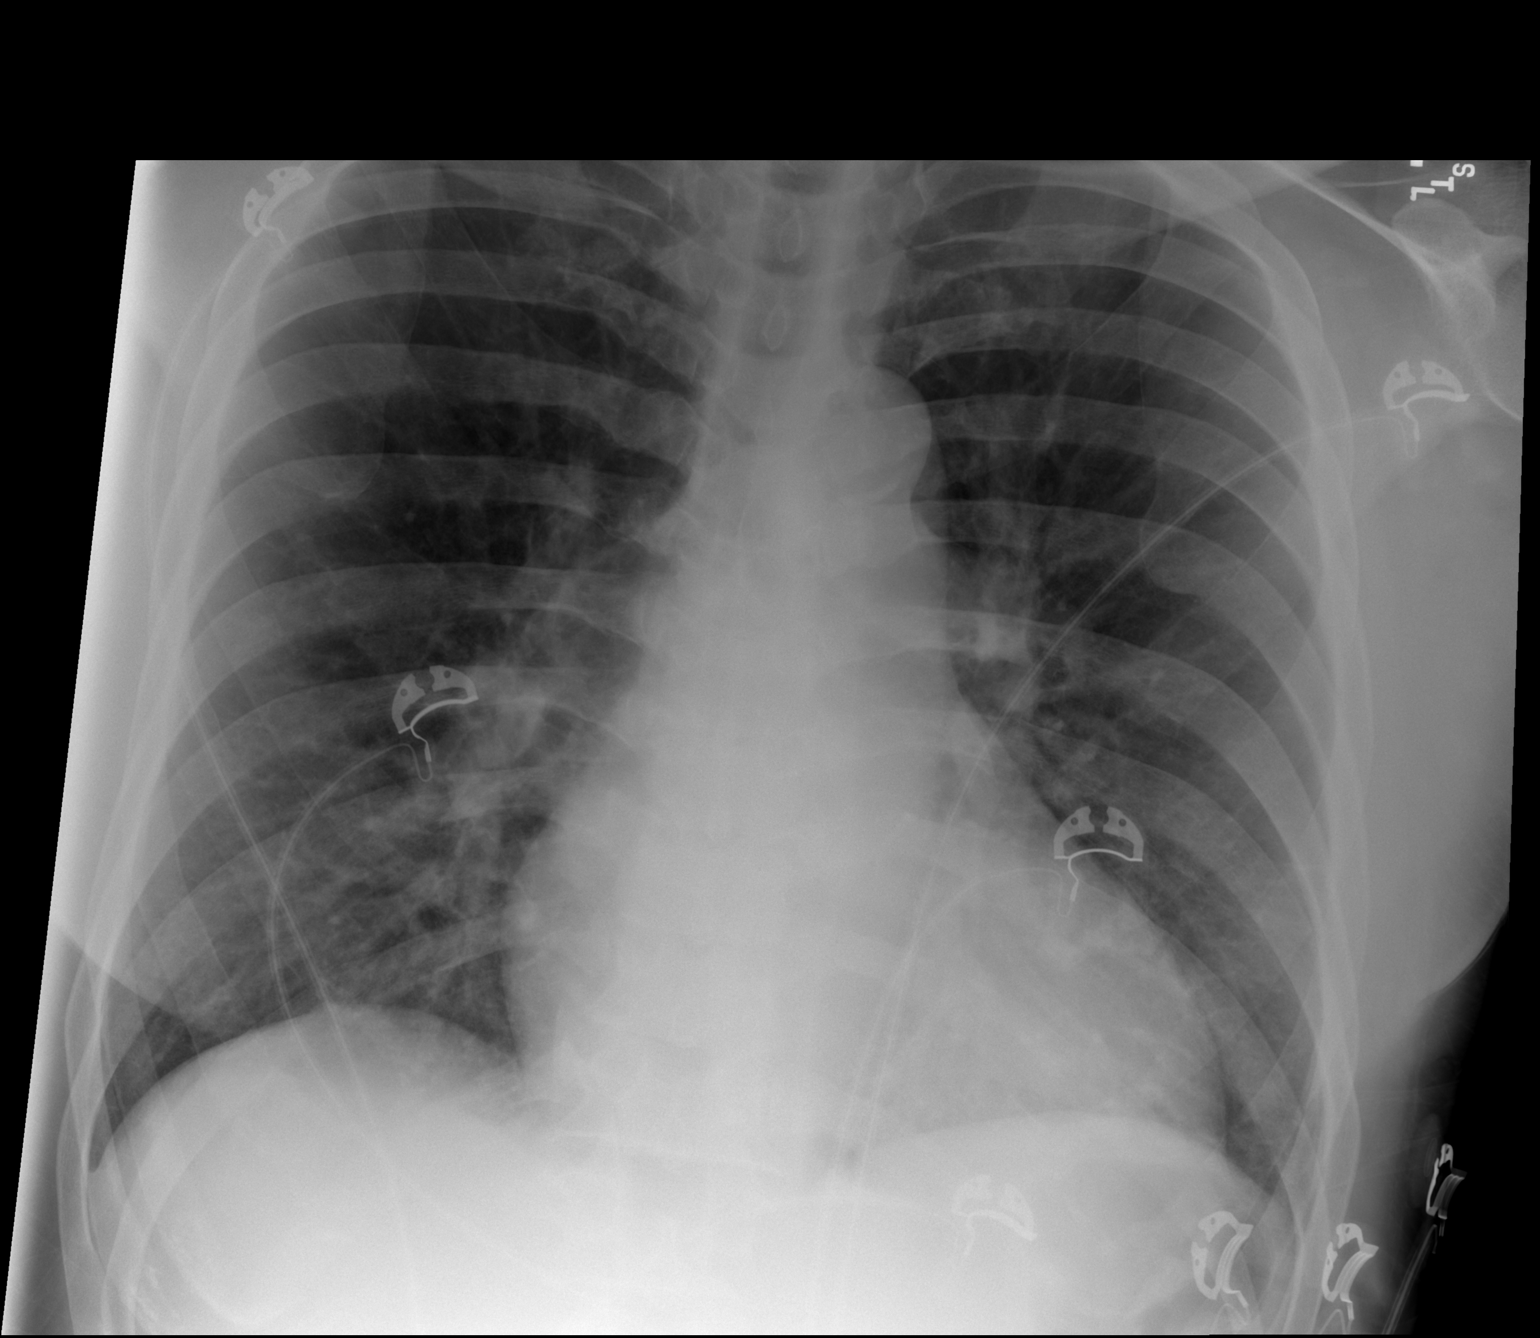

[2 of 2 positions shown; findings below may reference images not displayed]

FINDINGS: No focal consolidation, pleural effusion or pneumothorax. Mild
cardiomegaly. Atherosclerotic calcification of the aorta. No acute
osseous pathology.
IMPRESSION: No active cardiopulmonary disease.

## 2020-10-14 MED ORDER — NITROGLYCERIN 0.4 MG SL SUBL
0.4000 mg | SUBLINGUAL_TABLET | Freq: Once | SUBLINGUAL | Status: AC
  Administered 2020-10-14: 0.4 mg via SUBLINGUAL
  Filled 2020-10-14: qty 1

## 2020-10-14 MED ORDER — FUROSEMIDE 10 MG/ML IJ SOLN
40.0000 mg | Freq: Once | INTRAMUSCULAR | Status: AC
  Administered 2020-10-14: 40 mg via INTRAVENOUS
  Filled 2020-10-14: qty 4

## 2020-10-14 MED ORDER — METHYLPREDNISOLONE SODIUM SUCC 125 MG IJ SOLR
125.0000 mg | Freq: Once | INTRAMUSCULAR | Status: AC
  Administered 2020-10-14: 125 mg via INTRAVENOUS
  Filled 2020-10-14: qty 2

## 2020-10-14 MED ORDER — ALBUTEROL (5 MG/ML) CONTINUOUS INHALATION SOLN
10.0000 mg/h | INHALATION_SOLUTION | Freq: Once | RESPIRATORY_TRACT | Status: AC
  Administered 2020-10-14: 10 mg/h via RESPIRATORY_TRACT
  Filled 2020-10-14: qty 20

## 2020-10-14 MED ORDER — ALBUTEROL SULFATE HFA 108 (90 BASE) MCG/ACT IN AERS
4.0000 | INHALATION_SPRAY | Freq: Once | RESPIRATORY_TRACT | Status: AC
  Administered 2020-10-14: 4 via RESPIRATORY_TRACT
  Filled 2020-10-14: qty 6.7

## 2020-10-14 NOTE — ED Triage Notes (Signed)
Pt reports to the ER for SOB. Patient has a significant respiratory hx. Patient reports he has used his inhaler multiple times today without relief.

## 2020-10-14 NOTE — ED Notes (Signed)
Ambulating pulse ox results: Patient stood up briefly and his oxygen saturation dropped to 81%. After he sat back down, his saturation returned to 100% within one minute.

## 2020-10-14 NOTE — ED Notes (Signed)
Lab called and said BMP tube had hemolyzed, recollecting now.

## 2020-10-14 NOTE — ED Provider Notes (Signed)
[facility]-EMERGENCY DEPT Provider Note   CSN: [id number] Arrival date & time: 10/14/20  9373     History Chief Complaint  Patient presents with  . Shortness of Breath    [name] is a 54 y.o. male.  Presents to ER with concern for shortness of breath.  Patient reports over the past couple days he has had increased work of breathing, cough, no fevers.  States breathing is worse with any exertion.  No leg swelling.  Has had some intermittent chest discomfort, no active chest pain.  History of heart failure, asthma, COPD.  HPI     Past Medical History:  Diagnosis Date  . Asthma   . Bipolar 1 disorder (HCC)   . CHF (congestive heart failure) (HCC)   . Depression   . Homelessness   . Hypertension   . QT prolongation   . Schizoaffective disorder [facility])     Patient Active Problem List   Diagnosis Date Noted  . Acute on chronic combined systolic and diastolic CHF (congestive heart failure) (HCC) 10/02/2020  . Near syncope 07/21/2020  . Schizoaffective disorder (HCC) 06/24/2019  . SOB (shortness of breath)   . QT prolongation 05/25/2019  . Cocaine abuse (HCC)   . Elevated troponin   . Homelessness   . Suicidal ideation   . Bilateral foot pain 04/05/2019  . Housing situation unstable 03/28/2019  . Bilateral lower extremity edema   . Asthma 12/26/2016  . Tobacco use disorder 12/26/2016  . Schizophrenia (HCC) 12/26/2016  . Cocaine abuse with cocaine-induced mood disorder (HCC) 09/06/2016  . Alcohol use disorder, mild, abuse 07/31/2016  . Substance induced mood disorder (HCC) 07/30/2016  . Schizoaffective disorder, bipolar type (HCC) 05/31/2016  . Cocaine use disorder, severe, dependence (HCC) 05/31/2016  . Essential hypertension 05/31/2016    History reviewed. No pertinent surgical history.     Family History  Problem Relation Age of Onset  . Drug abuse Mother   . Suicidality Mother     Social History   Tobacco Use  . Smoking  status: Current Every Day Smoker    Years: 30.00    Types: Cigarettes  . Smokeless tobacco: Never Used  Vaping Use  . Vaping Use: Never used  Substance Use Topics  . Alcohol use: Yes    Comment: infrequently  . Drug use: Yes    Types: Cocaine, Marijuana    Home Medications Prior to Admission medications   Medication Sig Start Date End Date Taking? Authorizing Provider  albuterol (VENTOLIN HFA) 108 (90 Base) MCG/ACT inhaler Inhale 2 puffs into the lungs every 6 (six) hours as needed for wheezing or shortness of breath. 09/26/20   Leatha Gilding, MD  atorvastatin (LIPITOR) 20 MG tablet Take 1 tablet (20 mg total) by mouth daily. 10/07/20   Marguerita Merles Latif, DO  clopidogrel (PLAVIX) 75 MG tablet Take 1 tablet (75 mg total) by mouth daily. 10/08/20   Marguerita Merles Latif, DO  doxycycline (VIBRA-TABS) 100 MG tablet Take 1 tablet (100 mg total) by mouth every 12 (twelve) hours. 10/07/20   Marguerita Merles Latif, DO  furosemide (LASIX) 40 MG tablet Take 1 tablet (40 mg total) by mouth daily. 10/07/20 11/06/20  Marguerita Merles Latif, DO  gabapentin (NEURONTIN) 100 MG capsule Take 2 capsules (200 mg total) by mouth 2 (two) times daily. 10/07/20   Marguerita Merles Latif, DO  hydrALAZINE (APRESOLINE) 50 MG tablet Take 1 tablet (50 mg total) by mouth every 8 (eight) hours. 10/07/20   Marland Mcalpine,  Omair Latif, DO  isosorbide mononitrate (IMDUR) 30 MG 24 hr tablet Take 1 tablet (30 mg total) by mouth daily. 10/07/20 11/06/20  Marguerita Merles Latif, DO  neomycin-bacitracin-polymyxin (NEOSPORIN) ointment Apply topically 2 (two) times daily. Patient not taking: Reported on 10/02/2020 09/26/20   Leatha Gilding, MD  predniSONE (STERAPRED UNI-PAK 21 TAB) 10 MG (21) TBPK tablet Take 6 pills on day 1, 5 pills on day 2, 4 pills on day 3, 3 pills on day 4, 2 pills on day 5, 1 pill on day 6 and then stop on day 7 10/07/20   Sheikh, Omair Latif, DO  senna-docusate (SENOKOT-S) 8.6-50 MG tablet Take 1 tablet by mouth at bedtime as  needed for mild constipation. 10/07/20   Marguerita Merles Latif, DO    Allergies    Ibuprofen and Acetaminophen  Review of Systems   Review of Systems  Constitutional: Positive for chills. Negative for fever.  HENT: Negative for ear pain and sore throat.   Eyes: Negative for pain and visual disturbance.  Respiratory: Positive for cough and shortness of breath.   Cardiovascular: Positive for chest pain. Negative for palpitations.  Gastrointestinal: Negative for abdominal pain and vomiting.  Genitourinary: Negative for dysuria and hematuria.  Musculoskeletal: Negative for arthralgias and back pain.  Skin: Negative for color change and rash.  Neurological: Negative for seizures and syncope.  All other systems reviewed and are negative.   Physical Exam Updated Vital Signs BP (!) 158/137   Pulse 89   Temp 98.2 F (36.8 C) (Oral)   Resp (!) 22   SpO2 99%   Physical Exam Vitals and nursing note reviewed.  Constitutional:      Appearance: He is well-developed.     Comments: Tachypneic  HENT:     Head: Normocephalic and atraumatic.  Eyes:     Conjunctiva/sclera: Conjunctivae normal.  Cardiovascular:     Rate and Rhythm: Normal rate and regular rhythm.     Heart sounds: No murmur heard.   Pulmonary:     Effort: Pulmonary effort is normal.     Comments: Tachypneic, speaks in full sentences, bilateral expiratory wheeze in all lung fields Abdominal:     Palpations: Abdomen is soft.     Tenderness: There is no abdominal tenderness.  Musculoskeletal:     Cervical back: Neck supple.     Right lower leg: No edema.     Left lower leg: No edema.  Skin:    General: Skin is warm and dry.  Neurological:     Mental Status: He is alert.     ED Results / Procedures / Treatments   Labs (all labs ordered are listed, but only abnormal results are displayed) Labs Reviewed  CBC WITH DIFFERENTIAL/PLATELET - Abnormal; Notable for the following components:      Result Value   RBC 3.95  (*)    RDW 15.9 (*)    All other components within normal limits  BRAIN NATRIURETIC PEPTIDE - Abnormal; Notable for the following components:   B Natriuretic Peptide 1,183.4 (*)    All other components within normal limits  TROPONIN I (HIGH SENSITIVITY) - Abnormal; Notable for the following components:   Troponin I (High Sensitivity) 39 (*)    All other components within normal limits  RESPIRATORY PANEL BY RT PCR (FLU A&B, COVID)  MAGNESIUM  BASIC METABOLIC PANEL  TROPONIN I (HIGH SENSITIVITY)    EKG None  Radiology DG Chest 2 View  Result Date: 10/14/2020 CLINICAL DATA:  54 year old male  with shortness of breath. EXAM: CHEST - 2 VIEW COMPARISON:  Chest radiograph dated 10/06/2020. FINDINGS: No focal consolidation, pleural effusion or pneumothorax. Mild cardiomegaly. Atherosclerotic calcification of the aorta. No acute osseous pathology. IMPRESSION: No active cardiopulmonary disease. Electronically Signed   By: Elgie Collard M.D.   On: 10/14/2020 19:13    Procedures Procedures (including critical care time)  Medications Ordered in ED Medications  albuterol (PROVENTIL,VENTOLIN) solution continuous neb (has no administration in time range)  albuterol (VENTOLIN HFA) 108 (90 Base) MCG/ACT inhaler 4 puff (4 puffs Inhalation Given 10/14/20 2105)  methylPREDNISolone sodium succinate (SOLU-MEDROL) 125 mg/2 mL injection 125 mg (125 mg Intravenous Given 10/14/20 2103)  nitroGLYCERIN (NITROSTAT) SL tablet 0.4 mg (0.4 mg Sublingual Given 10/14/20 2212)  furosemide (LASIX) injection 40 mg (40 mg Intravenous Given 10/14/20 2212)    ED Course  I have reviewed the triage vital signs and the nursing notes.  Pertinent labs & imaging results that were available during my care of the patient were reviewed by me and considered in my medical decision making (see chart for details).    MDM Rules/Calculators/A&P                         54 year old male with heart failure, COPD presenting to  ER with shortness of breath.  Recent admission for heart failure versus COPD.  On exam noted significant expiratory wheezing.  CXR negative for infiltrate or edema.  Legs not edematous.  His BNP is however significantly elevated.  Suspect primarily COPD exacerbation but may have degree of fluid overload.  Provided steroids, albuterol.  Patient still having significant wheeze, did poorly on ambulation trial with significant hypoxia on minimal exertion.  Will provide additional albuterol, Lasix.    Will admit to hospitalist for further management.  Final Clinical Impression(s) / ED Diagnoses Final diagnoses:  COPD exacerbation (HCC)  Acute respiratory failure with hypoxia (HCC)  Heart failure, unspecified HF chronicity, unspecified heart failure type [facility])    Rx / DC Orders ED Discharge Orders    None       Milagros Loll, MD 10/14/20 2249

## 2020-10-14 NOTE — ED Notes (Signed)
This nurse entered room to obtain labs, upon walking in and introducing myself patient interrupts and is demanding the charge nurse. This nurse and charge nurse Ilene spoke with patient who then allowed this nurse to draw repeat bmp and troponin.

## 2020-10-15 ENCOUNTER — Other Ambulatory Visit: Payer: Self-pay

## 2020-10-15 DIAGNOSIS — I5043 Acute on chronic combined systolic (congestive) and diastolic (congestive) heart failure: Secondary | ICD-10-CM

## 2020-10-15 LAB — CREATININE, SERUM
Creatinine, Ser: 1.26 mg/dL — ABNORMAL HIGH (ref 0.61–1.24)
GFR, Estimated: >60 mL/min (ref >60)

## 2020-10-15 LAB — RAPID URINE DRUG SCREEN, HOSP PERFORMED
Amphetamines: NOT DETECTED
Barbiturates: NOT DETECTED
Benzodiazepines: NOT DETECTED
Cocaine: POSITIVE — AB
Opiates: NOT DETECTED
Tetrahydrocannabinol: NOT DETECTED

## 2020-10-15 LAB — TROPONIN I (HIGH SENSITIVITY)
Troponin I (High Sensitivity): 15 ng/L (ref <18)
Troponin I (High Sensitivity): 19 ng/L — ABNORMAL HIGH (ref <18)

## 2020-10-15 MED ORDER — HYDRALAZINE HCL 50 MG PO TABS
50.0000 mg | ORAL_TABLET | Freq: Three times a day (TID) | ORAL | Status: DC
  Administered 2020-10-15 (×3): 50 mg via ORAL
  Filled 2020-10-15 (×4): qty 1

## 2020-10-15 MED ORDER — POTASSIUM CHLORIDE CRYS ER 10 MEQ PO TBCR
10.0000 meq | EXTENDED_RELEASE_TABLET | Freq: Two times a day (BID) | ORAL | Status: DC
  Administered 2020-10-15 – 2020-10-16 (×3): 10 meq via ORAL
  Filled 2020-10-15 (×4): qty 1

## 2020-10-15 MED ORDER — IPRATROPIUM-ALBUTEROL 0.5-2.5 (3) MG/3ML IN SOLN
3.0000 mL | Freq: Three times a day (TID) | RESPIRATORY_TRACT | Status: DC
  Administered 2020-10-15 – 2020-10-18 (×7): 3 mL via RESPIRATORY_TRACT
  Filled 2020-10-15 (×8): qty 3

## 2020-10-15 MED ORDER — LOSARTAN POTASSIUM 50 MG PO TABS
50.0000 mg | ORAL_TABLET | Freq: Every day | ORAL | Status: DC
  Administered 2020-10-15 – 2020-10-18 (×4): 50 mg via ORAL
  Filled 2020-10-15 (×7): qty 1

## 2020-10-15 MED ORDER — MORPHINE SULFATE (PF) 2 MG/ML IV SOLN
2.0000 mg | INTRAVENOUS | Status: DC | PRN
  Administered 2020-10-15 – 2020-10-16 (×4): 2 mg via INTRAVENOUS
  Filled 2020-10-15 (×5): qty 1

## 2020-10-15 MED ORDER — DOXYCYCLINE HYCLATE 100 MG PO TABS
100.0000 mg | ORAL_TABLET | Freq: Two times a day (BID) | ORAL | Status: DC
  Administered 2020-10-15 – 2020-10-18 (×7): 100 mg via ORAL
  Filled 2020-10-15 (×10): qty 1

## 2020-10-15 MED ORDER — IPRATROPIUM-ALBUTEROL 0.5-2.5 (3) MG/3ML IN SOLN: 3.0000 mL | Freq: Four times a day (QID) | RESPIRATORY_TRACT | Status: DC | PRN

## 2020-10-15 MED ORDER — FUROSEMIDE 10 MG/ML IJ SOLN
40.0000 mg | Freq: Two times a day (BID) | INTRAMUSCULAR | Status: AC
  Administered 2020-10-15 – 2020-10-16 (×3): 40 mg via INTRAVENOUS
  Filled 2020-10-15 (×3): qty 4

## 2020-10-15 MED ORDER — ATORVASTATIN CALCIUM 20 MG PO TABS
20.0000 mg | ORAL_TABLET | Freq: Every day | ORAL | Status: DC
  Administered 2020-10-15 – 2020-10-18 (×4): 20 mg via ORAL
  Filled 2020-10-15 (×6): qty 1

## 2020-10-15 MED ORDER — GABAPENTIN 100 MG PO CAPS
200.0000 mg | ORAL_CAPSULE | Freq: Two times a day (BID) | ORAL | Status: DC
  Administered 2020-10-16 – 2020-10-18 (×5): 200 mg via ORAL
  Filled 2020-10-15 (×8): qty 2

## 2020-10-15 MED ORDER — NICOTINE 14 MG/24HR TD PT24
14.0000 mg | MEDICATED_PATCH | Freq: Every day | TRANSDERMAL | Status: DC
  Filled 2020-10-15 (×4): qty 1

## 2020-10-15 MED ORDER — KETOROLAC TROMETHAMINE 15 MG/ML IJ SOLN
15.0000 mg | Freq: Once | INTRAMUSCULAR | Status: AC
  Administered 2020-10-15: 15 mg via INTRAVENOUS
  Filled 2020-10-15: qty 1

## 2020-10-15 MED ORDER — IPRATROPIUM-ALBUTEROL 0.5-2.5 (3) MG/3ML IN SOLN
3.0000 mL | Freq: Four times a day (QID) | RESPIRATORY_TRACT | Status: DC
  Administered 2020-10-15: 3 mL via RESPIRATORY_TRACT
  Filled 2020-10-15: qty 3

## 2020-10-15 MED ORDER — ENOXAPARIN SODIUM 40 MG/0.4ML ~~LOC~~ SOLN
40.0000 mg | SUBCUTANEOUS | Status: DC
  Administered 2020-10-17 – 2020-10-18 (×2): 40 mg via SUBCUTANEOUS
  Filled 2020-10-15 (×4): qty 0.4

## 2020-10-15 MED ORDER — GABAPENTIN 100 MG PO CAPS
200.0000 mg | ORAL_CAPSULE | Freq: Two times a day (BID) | ORAL | Status: DC
  Administered 2020-10-15: 200 mg via ORAL
  Filled 2020-10-15 (×2): qty 2

## 2020-10-15 MED ORDER — METHYLPREDNISOLONE SODIUM SUCC 125 MG IJ SOLR
60.0000 mg | Freq: Three times a day (TID) | INTRAMUSCULAR | Status: DC
  Administered 2020-10-15 – 2020-10-16 (×4): 60 mg via INTRAVENOUS
  Filled 2020-10-15 (×5): qty 2

## 2020-10-15 MED ORDER — SPIRONOLACTONE 12.5 MG HALF TABLET
12.5000 mg | ORAL_TABLET | Freq: Every day | ORAL | Status: DC
  Administered 2020-10-15 – 2020-10-18 (×4): 12.5 mg via ORAL
  Filled 2020-10-15 (×4): qty 1

## 2020-10-15 MED ORDER — ISOSORBIDE MONONITRATE ER 30 MG PO TB24
30.0000 mg | ORAL_TABLET | Freq: Every day | ORAL | Status: DC
  Administered 2020-10-15 – 2020-10-18 (×5): 30 mg via ORAL
  Filled 2020-10-15 (×7): qty 1

## 2020-10-15 MED ORDER — SODIUM CHLORIDE 0.9% FLUSH: 3.0000 mL | INTRAVENOUS | Status: DC | PRN

## 2020-10-15 MED ORDER — SODIUM CHLORIDE 0.9 % IV SOLN: 250.0000 mL | INTRAVENOUS | Status: DC | PRN

## 2020-10-15 MED ORDER — ISOSORB DINITRATE-HYDRALAZINE 20-37.5 MG PO TABS
1.0000 | ORAL_TABLET | Freq: Three times a day (TID) | ORAL | Status: DC
  Filled 2020-10-15: qty 1

## 2020-10-15 MED ORDER — SODIUM CHLORIDE 0.9% FLUSH
3.0000 mL | Freq: Two times a day (BID) | INTRAVENOUS | Status: DC
  Administered 2020-10-15 – 2020-10-18 (×5): 3 mL via INTRAVENOUS

## 2020-10-15 MED ORDER — CLOPIDOGREL BISULFATE 75 MG PO TABS
75.0000 mg | ORAL_TABLET | Freq: Every day | ORAL | Status: DC
  Administered 2020-10-15 – 2020-10-18 (×4): 75 mg via ORAL
  Filled 2020-10-15 (×6): qty 1

## 2020-10-15 NOTE — Treatment Plan (Signed)
Patient refused full head to toe assessment. Refusing telemetry not on patient when I took over patients care late this am for [employer]. Patient was very verbally abusive to staff & demanding for the first several hours... "get the hell out of my room"  "shut up" when trying to educate him on his medication "you talk too much, this isn't my first rodeo"  Patient did call out once he calmed down & apologized sincerely.... will continue to monitor. A/Ox4, easily agitated  RA

## 2020-10-15 NOTE — Progress Notes (Signed)
Pt complaining of chest pain and refusing for EKG to be done, MD on call informed.

## 2020-10-15 NOTE — Progress Notes (Signed)
[name] is a 54 y.o. male with medical history significant for CHF, COPD, HTN, prolonged QT interval, cocaine abuse, schizoaffective disorder. Presents for evaluation of SOB that has been worsening over the past few days. He reports having significant increase in work of breathing with minimal excertion. He has not had chest pain or palpitations. He denies any fever. He has had a dry cough the past few days. He has mild swelling of legs that is chronic he states. He has been admitted twice in past month for similar symptoms.  Reports he has not been taking his medications at home and does not remember the last time he took them.  He continues to smoke.  He has history of cocaine use.    Admitted for acute on chronic sCHF, COPD/asthma exacerbation.  Started on IV diuretics.  Received IV solumedrol and bronchodilators.  UDS + Cocaine on 10/15/20.   10/15/20:  Seen and examined in his room.  Significant conversational dyspnea and air hunger noted for which IV morphine was ordered.  Suspect a component of acute asthma exacerbation.  Started scheduled IV solumedrol and scheduled nebs.  He denies chest pain.  Please refer to H&P dictated by my partner Dr. Rachael Darby on 10/15/20 for further details of the assessment and plan.

## 2020-10-15 NOTE — Progress Notes (Signed)
Received patient from ED. Patient is quick to tell RN, he will not follow fluid restriction, no telemetry and no frequent lab draws.  Linton Flemings, NP notified of patients requests.

## 2020-10-15 NOTE — Progress Notes (Signed)
Patient got agitated with this RN when attempting to give his morning medications, he was upset that prn pain medication that was just ordered was not with me when I entered the room, instructed him that he has to request prn meds and that I would give what I brought in and go back and get it, he proceeded to tell me that I had an attitude and and was cursing, requesting to speak with Charge RN, and told me to get out of his room and not come back, Charge RN, Annabelle Harman, made aware of situation.

## 2020-10-15 NOTE — Progress Notes (Signed)
Emergency contact called per patient request to notify of patient's hospital admission.  [phone] Homeless shelter)

## 2020-10-15 NOTE — Evaluation (Signed)
Physical Therapy Evaluation Patient Details Name: [name] MRN: [id number] DOB: 1966/06/08 Today's Date: 10/15/2020   History of Present Illness  54 y.o. male with PMH CHF, COPD, HTN, prolonged QT interval, cocaine abuse, schizoaffective disorder presents with c/o SOB thas has been worsening over the past few days.    Clinical Impression  Pt admitted with above diagnosis. Pt appears agitated during PT evaluation, unsure of PTA status, but pt does deny recent falls or ambulating with equipment. Pt with DOE 3/4 on RA, leaning on handrail in hallway, on RA with SpO2 99%. Pt with some unsteadiness upon standing and impulsive direction and speed changes while ambulating in hallway despite cues from therapist. Pt currently with functional limitations due to the deficits listed below (see PT Problem List). Pt will benefit from skilled PT to increase their independence and safety with mobility to allow discharge to the venue listed below.       Follow Up Recommendations No PT follow up    Equipment Recommendations  None recommended by PT    Recommendations for Other Services       Precautions / Restrictions Precautions Precautions: Fall Restrictions Weight Bearing Restrictions: No      Mobility  Bed Mobility Overal bed mobility: Modified Independent  General bed mobility comments: labored mobility, no assist or cues    Transfers Overall transfer level: Needs assistance Equipment used: None Transfers: Sit to/from Stand Sit to Stand: Supervision    General transfer comment: slightly unsteady upon standing that improves with time, declines assist from therapist  Ambulation/Gait Ambulation/Gait assistance: Supervision Gait Distance (Feet): 100 Feet Assistive device: None Gait Pattern/deviations: Step-through pattern;Decreased stride length Gait velocity: decreased   General Gait Details: pt ambulates in hallway leaning on handrail frequently, very impulsive with direction  and speed changes without warning despite cues from therapist, on RA with SpO2 99% and DOE 3/4  Stairs            Wheelchair Mobility    Modified Rankin (Stroke Patients Only)       Balance Overall balance assessment: Mild deficits observed, not formally tested            Pertinent Vitals/Pain Pain Assessment: Faces Faces Pain Scale: Hurts little more Pain Location: low back Pain Intervention(s): Limited activity within patient's tolerance;Monitored during session    Home Living Family/patient expects to be discharged to:: Shelter/Homeless                 Additional Comments: Per chart review, from Shelter/homeless, pt declined to answer questions and states "are we gonna walk or what"    Prior Function Level of Independence: Independent  Comments: Pt states "y'all never gave me anything to walk with" when asked about ambulation, denies any recent falls.     Hand Dominance        Extremity/Trunk Assessment   Upper Extremity Assessment Upper Extremity Assessment: Defer to OT evaluation    Lower Extremity Assessment Lower Extremity Assessment: Overall WFL for tasks assessed (Pt declines MMT or AROM testing, grossly 4/5 strength, reports some numbness throughout bil feet)    Cervical / Trunk Assessment Cervical / Trunk Assessment: Normal  Communication   Communication: No difficulties  Cognition Arousal/Alertness: Awake/alert Behavior During Therapy: Agitated;Impulsive Overall Cognitive Status: No family/caregiver present to determine baseline cognitive functioning  General Comments: Pt appears agitated with questions and responds with no answer or "are we gonna walk or what", answers to name appropriately.      General Comments  Exercises     Assessment/Plan    PT Assessment Patient needs continued PT services  PT Problem List Decreased activity tolerance;Decreased cognition;Decreased safety awareness;Cardiopulmonary status limiting  activity;Impaired sensation       PT Treatment Interventions DME instruction;Gait training;Stair training;Functional mobility training;Therapeutic activities;Therapeutic exercise;Balance training;Neuromuscular re-education;Patient/family education    PT Goals (Current goals can be found in the Care Plan section)  Acute Rehab PT Goals Patient Stated Goal: "just walk" PT Goal Formulation: With patient Time For Goal Achievement: 10/29/20 Potential to Achieve Goals: Good    Frequency Min 3X/week   Barriers to discharge        Co-evaluation               AM-PAC PT "6 Clicks" Mobility  Outcome Measure Help needed turning from your back to your side while in a flat bed without using bedrails?: None Help needed moving from lying on your back to sitting on the side of a flat bed without using bedrails?: None Help needed moving to and from a bed to a chair (including a wheelchair)?: None Help needed standing up from a chair using your arms (e.g., wheelchair or bedside chair)?: None Help needed to walk in hospital room?: A Little Help needed climbing 3-5 steps with a railing? : A Little 6 Click Score: 22    End of Session   Activity Tolerance: Treatment limited secondary to agitation Patient left: in bed;with call bell/phone within reach;with bed alarm set Nurse Communication: Mobility status;Other (comment) (SpO2 on RA) PT Visit Diagnosis: Unsteadiness on feet (R26.81);Other abnormalities of gait and mobility (R26.89)    Time: 1610-9604 PT Time Calculation (min) (ACUTE ONLY): 11 min   Charges:   PT Evaluation $PT Eval Low Complexity: 1 Low           Tori Ruhani Umland PT, DPT 10/15/20, 4:24 PM

## 2020-10-15 NOTE — H&P (Signed)
History and Physical    [name] VOJ:[id number] DOB: 1966/04/14 DOA: 10/14/2020  PCP: Patient, No Pcp Per   Patient coming from: Home  Chief Complaint: Shortness of breath  HPI: [name] is a 54 y.o. male with medical history significant for CHF, COPD, HTN, prolonged QT interval, cocaine abuse, schizoaffective disorder. Presents for evaluation of SOB that has been worsening over the past few days. He reports having significant increase in work of breathing with minimal excertion. He has not had chest pain or palpitations. He denies any fever. He has had a dry cough the past few days. He has mild swelling of legs that is chronic he states. He has been admitted twice in past month for similar symptoms.  Reports he has not been taking his medications at home and does not remember the last time he took them.  He states that he has to wait into the first the month until his "check comes in to be able to get his medications".  When asked if he picked up his prescriptions 2 weeks ago at the beginning of November he states he did not he does not remember the last time he took any of his medications.  He continues to smoke.  He has history of cocaine use but does not state when the last time he used was.  ED Course: He is found to have an elevated BNP level in the emergency room.  He was given dose of Lasix with good urinary output.  Chest x-ray did not show acute pulmonary edema.  He does have tachypnea and reports difficulty breathing with any exertion.  At rest on room air patient was not hypoxic but if he walked a short distance oxygen saturation did drop to below 88% per ER physician.  Review of Systems:  General: Denies weakness, fever, chills, weight loss, night sweats.  Denies dizziness.  Denies change in appetite HENT: Denies head trauma, headache, denies change in hearing, tinnitus.  Denies nasal congestion or bleeding.  Denies sore throat, sores in mouth.  Denies difficulty  swallowing Eyes: Denies blurry vision, pain in eye, drainage.  Denies discoloration of eyes. Neck: Denies pain.  Denies swelling.  Denies pain with movement. Cardiovascular: Denies chest pain, palpitations.  Reports mild edema.  Denies orthopnea Respiratory:  Reports shortness of breath and wheezing. Denies cough.  Denies sputum production Gastrointestinal: Denies abdominal pain, swelling.  Denies nausea, vomiting, diarrhea.  Denies melena.  Denies hematemesis. Musculoskeletal: Denies limitation of movement.  Denies deformity or swelling.  Denies pain.  Denies arthralgias or myalgias. Genitourinary: Denies pelvic pain.  Denies urinary frequency or hesitancy.  Denies dysuria.  Skin: Denies rash.  Denies petechiae, purpura, ecchymosis. Neurological: Denies headache.  Denies syncope. Denies seizure activity. Denies paresthesia. Denies slurred speech, drooping face. Denies visual change. Psychiatric: Denies depression, anxiety.  Denies suicidal thoughts or ideation.  Denies hallucinations.  Past Medical History:  Diagnosis Date  . Asthma   . Bipolar 1 disorder (HCC)   . CHF (congestive heart failure) (HCC)   . COPD (chronic obstructive pulmonary disease) (HCC)   . Depression   . Diabetes mellitus without complication (HCC)   . GERD (gastroesophageal reflux disease)   . Homelessness   . Hypertension   . QT prolongation   . Schizoaffective disorder (HCC)     History reviewed. No pertinent surgical history.  Social History  reports that he has been smoking cigarettes. He has smoked for the past 30.00 years. He has never used smokeless tobacco. He  reports current alcohol use. He reports current drug use. Drugs: Cocaine and Marijuana.  Allergies  Allergen Reactions  . Ibuprofen Anaphylaxis  . Acetaminophen Hives, Nausea And Vomiting and Rash    Pt overdosed on bottle of APAP 05/06/16     Family History  Problem Relation Age of Onset  . Drug abuse Mother   . Suicidality Mother       Prior to Admission medications   Medication Sig Start Date End Date Taking? Authorizing Provider  albuterol (VENTOLIN HFA) 108 (90 Base) MCG/ACT inhaler Inhale 2 puffs into the lungs every 6 (six) hours as needed for wheezing or shortness of breath. 09/26/20   Leatha Gilding, MD  atorvastatin (LIPITOR) 20 MG tablet Take 1 tablet (20 mg total) by mouth daily. 10/07/20   Marguerita Merles Latif, DO  clopidogrel (PLAVIX) 75 MG tablet Take 1 tablet (75 mg total) by mouth daily. 10/08/20   Marguerita Merles Latif, DO  doxycycline (VIBRA-TABS) 100 MG tablet Take 1 tablet (100 mg total) by mouth every 12 (twelve) hours. 10/07/20   Marguerita Merles Latif, DO  furosemide (LASIX) 40 MG tablet Take 1 tablet (40 mg total) by mouth daily. 10/07/20 11/06/20  Marguerita Merles Latif, DO  gabapentin (NEURONTIN) 100 MG capsule Take 2 capsules (200 mg total) by mouth 2 (two) times daily. 10/07/20   Marguerita Merles Latif, DO  hydrALAZINE (APRESOLINE) 50 MG tablet Take 1 tablet (50 mg total) by mouth every 8 (eight) hours. 10/07/20   Marguerita Merles Latif, DO  isosorbide mononitrate (IMDUR) 30 MG 24 hr tablet Take 1 tablet (30 mg total) by mouth daily. 10/07/20 11/06/20  Marguerita Merles Latif, DO  neomycin-bacitracin-polymyxin (NEOSPORIN) ointment Apply topically 2 (two) times daily. Patient not taking: Reported on 10/02/2020 09/26/20   Leatha Gilding, MD  predniSONE (STERAPRED UNI-PAK 21 TAB) 10 MG (21) TBPK tablet Take 6 pills on day 1, 5 pills on day 2, 4 pills on day 3, 3 pills on day 4, 2 pills on day 5, 1 pill on day 6 and then stop on day 7 10/07/20   Sheikh, Omair Latif, DO  senna-docusate (SENOKOT-S) 8.6-50 MG tablet Take 1 tablet by mouth at bedtime as needed for mild constipation. 10/07/20   Marguerita Merles [place], DO    Physical Exam: Vitals:   10/14/20 2011 10/14/20 2045 10/14/20 2145 10/14/20 2252  BP: (!) 138/108 (!) 168/115 (!) 158/137   Pulse:      Resp:      Temp:      TempSrc:      SpO2:    100%     Constitutional: NAD, calm, comfortable Vitals:   10/14/20 2011 10/14/20 2045 10/14/20 2145 10/14/20 2252  BP: (!) 138/108 (!) 168/115 (!) 158/137   Pulse:      Resp:      Temp:      TempSrc:      SpO2:    100%   General: WDWN, Calm. Has nebulizer treatment running. Speaks in full sentences. Alert and oriented x3.  Eyes: EOMI, PERRL, lids and conjunctivae normal.  Sclera nonicteric HENT:  [place]/AT, external ears normal.  Nares patent without epistasis.  Mucous membranes are dry. Posterior pharynx clear of any exudate or lesions. Neck: Soft, normal range of motion, supple, no masses, Trachea midline Respiratory:  Equal breath sounds. Diffuse scattered rales. No rhonchi. Has mild expiratory wheezing, no crackles. Normal respiratory effort. No accessory muscle use.  Cardiovascular: Regular rate and rhythm, no murmurs / rubs / gallops. No extremity  edema. 2+ pedal pulses.  Abdomen: Soft, no tenderness, nondistended, no rebound or guarding.  No masses palpated. Bowel sounds normoactive Musculoskeletal: FROM. No cyanosis. No joint deformity upper and lower extremities. Normal muscle tone.  Skin: Warm, dry, intact no rashes, lesions, ulcers. No induration Neurologic: CN 2-12 grossly intact.  Normal speech.  Sensation intact, Strength 5/5 in all extremities.   Psychiatric: Normal judgment and insight.  Normal mood. Cooperative with exam.   Labs on Admission: I have personally reviewed following labs and imaging studies  CBC: Recent Labs  Lab 10/14/20 2023  WBC 7.7  NEUTROABS 4.6  HGB 13.1  HCT 39.0  MCV 98.7  PLT 232    Basic Metabolic Panel: Recent Labs  Lab 10/14/20 2023 10/14/20 2200  NA  --  141  K  --  4.1  CL  --  109  CO2  --  23  GLUCOSE  --  183*  BUN  --  14  CREATININE  --  1.37*  CALCIUM  --  8.5*  MG 2.2  --     GFR: Estimated Creatinine Clearance: 68.8 mL/min (A) (by C-G formula based on SCr of 1.37 mg/dL (H)).  Liver Function Tests: No results for  input(s): AST, ALT, ALKPHOS, BILITOT, PROT, ALBUMIN in the last 168 hours.  Urine analysis:    Component Value Date/Time   COLORURINE YELLOW 08/22/2020 1127   APPEARANCEUR CLEAR 08/22/2020 1127   LABSPEC 1.010 08/22/2020 1127   PHURINE 5.0 08/22/2020 1127   GLUCOSEU NEGATIVE 08/22/2020 1127   HGBUR NEGATIVE 08/22/2020 1127   BILIRUBINUR NEGATIVE 08/22/2020 1127   KETONESUR NEGATIVE 08/22/2020 1127   PROTEINUR NEGATIVE 08/22/2020 1127   NITRITE NEGATIVE 08/22/2020 1127   LEUKOCYTESUR NEGATIVE 08/22/2020 1127    Radiological Exams on Admission: DG Chest 2 View  Result Date: 10/14/2020 CLINICAL DATA:  54 year old male with shortness of breath. EXAM: CHEST - 2 VIEW COMPARISON:  Chest radiograph dated 10/06/2020. FINDINGS: No focal consolidation, pleural effusion or pneumothorax. Mild cardiomegaly. Atherosclerotic calcification of the aorta. No acute osseous pathology. IMPRESSION: No active cardiopulmonary disease. Electronically Signed   By: Elgie Collard M.D.   On: 10/14/2020 19:13    EKG: Independently reviewed.  EKG shows normal sinus rhythm with right atrial enlargement.  Prolonged QT with a QTC of 513.  No acute ST elevation or depression.  Assessment/Plan Principal Problem:   Acute on chronic combined systolic and diastolic CHF (congestive heart failure) [facility]) Mr. Barquero is admitted to telemetry floor.  He has history of CHF as well as cocaine abuse.  UDS has been ordered and is pending. Patient had echocardiogram at the end of October 2021 which showed EF of 40 to 45% with grade 2 diastolic dysfunction.  Echo would not be repeated now as it was done within the last few weeks. Patient states he has not been taking his medications since his discharge last week.  Diurese with Lasix 40 mg IV twice a day.   Monitor daily weight and I&Os Start low-dose losartan 50 mg a day, spironolactone and Bildil 3 times daily for CHF. Beta blocker therapy held with history of cocaine use. If  cocaine negative on UDS may be started in am.   Active Problems:   COPD with acute exacerbation (HCC) Patient placed on doxycycline twice daily with COPD exacerbation requiring hospitalization. DuoNebs every 6 hours as needed for shortness of breath, cough, wheeze. Spirometer every 2 hours while awake.     Elevated troponin Monitor serial troponin levels overnight.  Initial level mildly elevated at 39.  Repeat level was 36 an hour later in the emergency room    Essential hypertension Monitor blood pressure.  Patient replaced on Coreg, losartan and spironolactone.    QT prolongation Patient with chronic prolonged QT.  Avoid medications that can further prolong the QT interval.    Schizoaffective disorder, bipolar type [facility]) Patient may need psychiatric evaluation to assist with medication management    Tobacco use disorder Nicotine patch to prevent withdrawal.  Smoke cessation education before discharge.    Cocaine abuse (HCC)     DVT prophylaxis: Lovenox for DVT prophylaxis Code Status:   Full code Family Communication:  Diagnosis and plan discussed with patient.  Patient verbalized understanding agrees to plan.  Further recommendation to follow as clinical indicated Disposition Plan:   Patient is from:  Home  Anticipated DC to:  To be determined  Anticipated DC date:  Anticipate greater than 2 midnight stay in the hospital to treat acute medical condition   Admission status:  Inpatient   Claudean Severance Bryleigh Ottaway MD [facility]  How to contact the [facility] Attending or Consulting provider 7A - 7P or covering provider during after hours 7P -7A, for this patient?   1. Check the care team in [facility] and look for a) attending/consulting TRH provider listed and b) the [facility] team listed 2. Log into [email or web address] and use [place]'s universal password to access. If you do not have the password, please contact the hospital operator. 3. Locate the [facility] provider you are looking for under  [facility] and page to a number that you can be directly reached. 4. If you still have difficulty reaching the provider, please page the [facility] (Director on Call) for the Hospitalists listed on amion for assistance.  10/15/2020, 12:09 AM

## 2020-10-16 ENCOUNTER — Ambulatory Visit: Payer: Self-pay | Admitting: Cardiology

## 2020-10-16 LAB — BASIC METABOLIC PANEL
Anion gap: 11 (ref 5–15)
BUN: 24 mg/dL — ABNORMAL HIGH (ref 6–20)
CO2: 22 mmol/L (ref 22–32)
Calcium: 9.5 mg/dL (ref 8.9–10.3)
Chloride: 104 mmol/L (ref 98–111)
Creatinine, Ser: 1.21 mg/dL (ref 0.61–1.24)
GFR, Estimated: >60 mL/min (ref >60)
Glucose, Bld: 257 mg/dL — ABNORMAL HIGH (ref 70–99)
Potassium: 4.1 mmol/L (ref 3.5–5.1)
Sodium: 137 mmol/L (ref 135–145)

## 2020-10-16 LAB — GLUCOSE, CAPILLARY
Glucose-Capillary: 345 mg/dL — ABNORMAL HIGH (ref 70–99)
Glucose-Capillary: 284 mg/dL — ABNORMAL HIGH (ref 70–99)

## 2020-10-16 MED ORDER — FUROSEMIDE 40 MG PO TABS
40.0000 mg | ORAL_TABLET | Freq: Every day | ORAL | Status: DC
  Administered 2020-10-17 – 2020-10-18 (×3): 40 mg via ORAL
  Filled 2020-10-16 (×3): qty 1

## 2020-10-16 MED ORDER — INSULIN ASPART 100 UNIT/ML ~~LOC~~ SOLN
0.0000 [IU] | Freq: Three times a day (TID) | SUBCUTANEOUS | Status: DC
  Administered 2020-10-17: 3 [IU] via SUBCUTANEOUS
  Administered 2020-10-17: 7 [IU] via SUBCUTANEOUS
  Administered 2020-10-18: 3 [IU] via SUBCUTANEOUS

## 2020-10-16 MED ORDER — FUROSEMIDE 40 MG PO TABS: 40.0000 mg | ORAL_TABLET | Freq: Every day | ORAL | Status: DC

## 2020-10-16 MED ORDER — FUROSEMIDE 10 MG/ML IJ SOLN
40.0000 mg | Freq: Once | INTRAMUSCULAR | Status: AC
  Administered 2020-10-16: 40 mg via INTRAVENOUS
  Filled 2020-10-16: qty 4

## 2020-10-16 MED ORDER — INSULIN ASPART 100 UNIT/ML ~~LOC~~ SOLN
0.0000 [IU] | Freq: Every day | SUBCUTANEOUS | Status: DC
  Administered 2020-10-16: 3 [IU] via SUBCUTANEOUS

## 2020-10-16 MED ORDER — POTASSIUM CHLORIDE CRYS ER 20 MEQ PO TBCR
20.0000 meq | EXTENDED_RELEASE_TABLET | Freq: Every day | ORAL | Status: DC
  Administered 2020-10-18: 20 meq via ORAL
  Filled 2020-10-16 (×2): qty 1

## 2020-10-16 MED ORDER — OXYCODONE HCL 5 MG PO TABS
5.0000 mg | ORAL_TABLET | ORAL | Status: DC | PRN
  Administered 2020-10-16 – 2020-10-18 (×12): 5 mg via ORAL
  Filled 2020-10-16 (×12): qty 1

## 2020-10-16 MED ORDER — HYDRALAZINE HCL 50 MG PO TABS
100.0000 mg | ORAL_TABLET | Freq: Three times a day (TID) | ORAL | Status: DC
  Administered 2020-10-17 – 2020-10-18 (×3): 100 mg via ORAL
  Filled 2020-10-16 (×6): qty 2

## 2020-10-16 MED ORDER — PREDNISONE 20 MG PO TABS: 40.0000 mg | ORAL_TABLET | Freq: Every day | ORAL | Status: DC

## 2020-10-16 MED ORDER — GUAIFENESIN ER 600 MG PO TB12
1200.0000 mg | ORAL_TABLET | Freq: Two times a day (BID) | ORAL | Status: AC
  Administered 2020-10-16 – 2020-10-18 (×4): 1200 mg via ORAL
  Filled 2020-10-16 (×6): qty 2

## 2020-10-16 MED ORDER — SODIUM CHLORIDE 3 % IN NEBU
4.0000 mL | INHALATION_SOLUTION | Freq: Two times a day (BID) | RESPIRATORY_TRACT | Status: DC
  Filled 2020-10-16: qty 4

## 2020-10-16 MED ORDER — PREDNISONE 20 MG PO TABS
40.0000 mg | ORAL_TABLET | Freq: Every day | ORAL | Status: DC
  Administered 2020-10-16 – 2020-10-18 (×3): 40 mg via ORAL
  Filled 2020-10-16 (×3): qty 2

## 2020-10-16 NOTE — Progress Notes (Signed)
Upon entering room this morning patient demanding to have roxicodone instead of morphine.  This nurse informed patient that roxicodone not ordered but could give morphine as ordered.  Patient states, " I don't want any fucking morphine, that shit dont work and they need to get my damn medications right.  I'll just have to go above your head to get what I need."  This nurse informed patient that he could receive morphine and other medications as ordered.  Charge nurse notified.

## 2020-10-16 NOTE — Progress Notes (Signed)
Patient had blood sugar of 286 upon HS check. Patient then requested a sandwich and regular soda. RN provided education to patient that that would further increase his blood sugar and should consider other options such as diet soda, etc. Patient then stated "I dont drink diet soda. I won't take my meds until I get the drink I asked for." Patient refused all meds for RN. Patient called charge nurse stating he is refusing to have me (bedside nurse) care for him tonight and he demands a new nurse and wants to speak with the [facility]. Charge RN spoke with patient and reiterated he cannot "fire" RN's because he is unhappy with the care they are trying to provide. Patient then agreed to accept some medications from charge nurse but still requested to speak to the [facility].  The [facility] came to floor x2 to reiterate to patient he has a competent bedside nurse and he needs to be respectful to staff. She also reiterated that he can refuse his medications (as told previously be RN and charge) in a respectful way.  Patient still appears angry with staff but allowed insulin administration. No evidence of learning noted. Continue to monitor.  Tana Coast, RN

## 2020-10-16 NOTE — TOC Initial Note (Signed)
Transition of Care [facility]) - Initial/Assessment Note    Patient Details  Name: [name] MRN: [id number] Date of Birth: [birth year]  Transition of Care [facility]) CM/SW Contact:    Lanier Clam, RN Phone Number: 10/16/2020, 10:35 AM  Clinical Narrative:  Patient has hx non compliance with resources-pcp,meds,shelter,substance abuse resources-not eligible for med asst-used in Aug 2021.Patient does not follow through with resources given.Will monitor for d/c plans.             Barriers to Discharge: No Barriers Identified   Patient Goals and CMS Choice Patient states their goals for this hospitalization and ongoing recovery are:: go to where he is staying CMS Medicare.gov Compare Post Acute Care list provided to:: Patient Choice offered to / list presented to : Patient  Expected Discharge Plan and Services     Discharge Planning Services: CM Consult   Living arrangements for the past 2 months: Homeless Shelter                                      Prior Living Arrangements/Services Living arrangements for the past 2 months: Homeless Shelter Lives with:: Self Patient language and need for interpreter reviewed:: Yes Do you feel safe going back to the place where you live?: No   Homeless-numerous resources given  Need for Family Participation in Patient Care: No (Comment) Care giver support system in place?: No (comment)   Criminal Activity/Legal Involvement Pertinent to Current Situation/Hospitalization: No - Comment as needed  Activities of Daily Living Home Assistive Devices/Equipment: None ADL Screening (condition at time of admission) Patient's cognitive ability adequate to safely complete daily activities?: Yes Is the patient deaf or have difficulty hearing?: No Does the patient have difficulty seeing, even when wearing glasses/contacts?: No Does the patient have difficulty concentrating, remembering, or making decisions?: No Patient able to express need for  assistance with ADLs?: No Does the patient have difficulty dressing or bathing?: No Independently performs ADLs?: Yes (appropriate for developmental age) Does the patient have difficulty walking or climbing stairs?: No Weakness of Legs: None Weakness of Arms/Hands: None  Permission Sought/Granted Permission sought to share information with : Case Manager Permission granted to share information with : Yes, Verbal Permission Granted              Emotional Assessment Appearance:: Appears stated age Attitude/Demeanor/Rapport: Guarded Affect (typically observed): Appropriate Orientation: : Oriented to Self, Oriented to Place, Oriented to  Time, Oriented to Situation Alcohol / Substance Use: Alcohol Use, Tobacco Use Psych Involvement: No (comment)  Admission diagnosis:  COPD exacerbation (HCC) [J44.1] Acute respiratory failure with hypoxia (HCC) [J96.01] Acute on chronic combined systolic and diastolic CHF (congestive heart failure) (HCC) [I50.43] Heart failure, unspecified HF chronicity, unspecified heart failure type (HCC) [I50.9] Patient Active Problem List   Diagnosis Date Noted  . COPD with acute exacerbation (HCC) 10/14/2020  . Acute on chronic combined systolic and diastolic CHF (congestive heart failure) (HCC) 10/02/2020  . Near syncope 07/21/2020  . Schizoaffective disorder (HCC) 06/24/2019  . SOB (shortness of breath)   . QT prolongation 05/25/2019  . Cocaine abuse (HCC)   . Elevated troponin   . Homelessness   . Suicidal ideation   . Bilateral foot pain 04/05/2019  . Housing situation unstable 03/28/2019  . Bilateral lower extremity edema   . Asthma 12/26/2016  . Tobacco use disorder 12/26/2016  . Schizophrenia (HCC) 12/26/2016  . Cocaine abuse with cocaine-induced  mood disorder (HCC) 09/06/2016  . Alcohol use disorder, mild, abuse 07/31/2016  . Substance induced mood disorder (HCC) 07/30/2016  . Schizoaffective disorder, bipolar type (HCC) 05/31/2016  . Cocaine  use disorder, severe, dependence (HCC) 05/31/2016  . Essential hypertension 05/31/2016   PCP:  Patient, No Pcp Per Pharmacy:   [facility] [place], [name] - [address] [name] [address] Phone: [phone] Fax: [phone]     Social Determinants of Health (SDOH) Interventions    Readmission Risk Interventions Readmission Risk Prevention Plan 10/15/2020 10/05/2020 07/23/2020  Transportation Screening Complete Complete Complete  PCP or Specialist Appt within 3-5 Days - - Complete  HRI or Home Care Consult - - Complete  Social Work Consult for Recovery Care Planning/Counseling - - Complete  Palliative Care Screening - - Not Applicable  Medication Review (RN Care Manager) Complete Referral to Pharmacy Complete  PCP or Specialist appointment within 3-5 days of discharge Complete Complete -  HRI or Home Care Consult Complete Patient refused -  SW Recovery Care/Counseling Consult Complete Complete -  Palliative Care Screening Not Applicable Not Applicable -  Skilled Nursing Facility Not Applicable Not Applicable -  Some recent data might be hidden

## 2020-10-16 NOTE — Progress Notes (Signed)
PROGRESS NOTE  [name] BJS:[id number] DOB: [birth year] DOA: 10/14/2020 PCP: Patient, No Pcp Per  HPI/Recap of past 24 hours: [name] a 54 y.o.malewith medical history significant forCHF, COPD, HTN, prolonged QT interval, cocaine abuse, schizoaffective disorder. Presents for evaluation of SOB that has been worsening over the past few days. He reports having significant increase in work of breathing with minimal excertion. He has not had chest pain or palpitations. He denies any fever. He has had a dry cough the past few days. He has mild swelling of legs that is chronic he states. He has been admitted twice in past month for similar symptoms.Reports he has not been taking his medications at home and does not remember the last time he took them. He continues to smoke. He has history of cocaine use.    Admitted for acute on chronic sCHF, COPD/asthma exacerbation.  Started on IV diuretics.  Received IV solumedrol and bronchodilators.  UDS + Cocaine on 10/15/20.   Hospital course complicated by noncompliance with medical management.  10/16/20: Reports chest congestion with O2 saturation 100% on room air.  Started pulmonary toilet for productive cough.  Denies any chest pain at the time of this visit.  States the last time he took cocaine was 6 days ago.  Assessment/Plan: Principal Problem:   Acute on chronic combined systolic and diastolic CHF (congestive heart failure) (HCC) Active Problems:   Schizoaffective disorder, bipolar type (HCC)   Essential hypertension   Tobacco use disorder   Cocaine abuse (HCC)   QT prolongation   COPD with acute exacerbation (HCC)  Asthma/COPD exacerbation Presented with significant dyspnea Chest x-ray no active disease, not hypoxic, O2 sat 100% RA Continue doxycycline, switched IV steroids to prednisone on 10/16/20 Continue bronchodilators Advised on the importance of cocaine cessation  Acute on chronic sCHF Last 2D echo LVEF  40-45% Received IV lasix 40 mg BID Switched to po lasix on 10/16/20 Continue current cardiac medications. Strict I&O and daily weight Net I&O -2.8 L  Elevated troponin, likely demand ischemia in the setting of cocaine use Troponin S 19 on 10/15/20 Denies any chest pain at the time of this visit.   Continue to monitor on telemetry  Prolonged QTC QTC 513 at presentation Avoid QTC prolonging agents Repeat 12 lead EKG  Polyneuropathy Resume home gabapentin  Hyperglycemia, secondary to diet noncompliance.  Hemoglobin A1c 5.9  On 09/09/20 Insulin sliding scale  Cocaine abuse UDS 10/15/20 + Cocaine Cessation counseling at bedside    Code Status: Full code   Family Communication: None at bedside   Disposition Plan: Likely to previous setting.   Consultants:  None  Procedures:  None   Antimicrobials:  Doxycycline  DVT prophylaxis: SQ lovenox daily  Status is: Inpatient    Dispo:  Patient From: Group Home  Planned Disposition: Group Home  Expected discharge date: 10/17/20  Medically stable for discharge: No, started pulmonary toilet for newly reported chest congestion.         Objective: Vitals:   10/16/20 0457 10/16/20 0500 10/16/20 0739 10/16/20 1425  BP: (!) 153/99     Pulse: 84     Resp: (!) 22     Temp: 97.9 F (36.6 C)     TempSrc: Oral     SpO2: 100%  99% 100%  Weight:  87 kg    Height:        Intake/Output Summary (Last 24 hours) at 10/16/2020 1435 Last data filed at 10/16/2020 0900 Gross per 24 hour  Intake  603 ml  Output 2125 ml  Net -1522 ml   Filed Weights   10/16/20 0500  Weight: 87 kg    Exam:  . General: 54 y.o. year-old male well developed well nourished in no acute distress.  Alert and oriented x3. . Cardiovascular: Regular rate and rhythm with no rubs or gallops.  No thyromegaly or JVD noted.   Marland Kitchen Respiratory: Clear to auscultation with no wheezes or rales. Good inspiratory effort. . Abdomen: Soft nontender  nondistended with normal bowel sounds x4 quadrants. . Musculoskeletal: No lower extremity edema bilaterally . Psychiatry: Mood is anxious   Data Reviewed: CBC: Recent Labs  Lab 10/14/20 2023  WBC 7.7  NEUTROABS 4.6  HGB 13.1  HCT 39.0  MCV 98.7  PLT 232   Basic Metabolic Panel: Recent Labs  Lab 10/14/20 2023 10/14/20 2200 10/15/20 0642 10/16/20 0539  NA  --  141  --  137  K  --  4.1  --  4.1  CL  --  109  --  104  CO2  --  23  --  22  GLUCOSE  --  183*  --  257*  BUN  --  14  --  24*  CREATININE  --  1.37* 1.26* 1.21  CALCIUM  --  8.5*  --  9.5  MG 2.2  --   --   --    GFR: Estimated Creatinine Clearance: 78 mL/min (by C-G formula based on SCr of 1.21 mg/dL). Liver Function Tests: No results for input(s): AST, ALT, ALKPHOS, BILITOT, PROT, ALBUMIN in the last 168 hours. No results for input(s): LIPASE, AMYLASE in the last 168 hours. No results for input(s): AMMONIA in the last 168 hours. Coagulation Profile: No results for input(s): INR, PROTIME in the last 168 hours. Cardiac Enzymes: No results for input(s): CKTOTAL, CKMB, CKMBINDEX, TROPONINI in the last 168 hours. BNP (last 3 results) No results for input(s): PROBNP in the last 8760 hours. HbA1C: No results for input(s): HGBA1C in the last 72 hours. CBG: No results for input(s): GLUCAP in the last 168 hours. Lipid Profile: No results for input(s): CHOL, HDL, LDLCALC, TRIG, CHOLHDL, LDLDIRECT in the last 72 hours. Thyroid Function Tests: No results for input(s): TSH, T4TOTAL, FREET4, T3FREE, THYROIDAB in the last 72 hours. Anemia Panel: No results for input(s): VITAMINB12, FOLATE, FERRITIN, TIBC, IRON, RETICCTPCT in the last 72 hours. Urine analysis:    Component Value Date/Time   COLORURINE YELLOW 08/22/2020 1127   APPEARANCEUR CLEAR 08/22/2020 1127   LABSPEC 1.010 08/22/2020 1127   PHURINE 5.0 08/22/2020 1127   GLUCOSEU NEGATIVE 08/22/2020 1127   HGBUR NEGATIVE 08/22/2020 1127   BILIRUBINUR NEGATIVE  08/22/2020 1127   KETONESUR NEGATIVE 08/22/2020 1127   PROTEINUR NEGATIVE 08/22/2020 1127   NITRITE NEGATIVE 08/22/2020 1127   LEUKOCYTESUR NEGATIVE 08/22/2020 1127   Sepsis Labs: @LABRCNTIP (procalcitonin:4,lacticidven:4)  ) Recent Results (from the past 240 hour(s))  Respiratory Panel by RT PCR (Flu A&B, Covid) - Nasopharyngeal Swab     Status: None   Collection Time: 10/14/20  8:30 PM   Specimen: Nasopharyngeal Swab  Result Value Ref Range Status   SARS Coronavirus 2 by RT PCR NEGATIVE NEGATIVE Final    Comment: (NOTE) SARS-CoV-2 target nucleic acids are NOT DETECTED.  The SARS-CoV-2 RNA is generally detectable in upper respiratoy specimens during the acute phase of infection. The lowest concentration of SARS-CoV-2 viral copies this assay can detect is 131 copies/mL. A negative result does not preclude SARS-Cov-2 infection and should not  be used as the sole basis for treatment or other patient management decisions. A negative result may occur with  improper specimen collection/handling, submission of specimen other than nasopharyngeal swab, presence of viral mutation(s) within the areas targeted by this assay, and inadequate number of viral copies (<131 copies/mL). A negative result must be combined with clinical observations, patient history, and epidemiological information. The expected result is Negative.  Fact Sheet for Patients:  https://www.moore.com/  Fact Sheet for Healthcare Providers:  https://www.young.biz/  This test is no t yet approved or cleared by the [place] FDA and  has been authorized for detection and/or diagnosis of SARS-CoV-2 by FDA under an Emergency Use Authorization (EUA). This EUA will remain  in effect (meaning this test can be used) for the duration of the COVID-19 declaration under Section 564(b)(1) of the Act, 21 U.S.C. section 360bbb-3(b)(1), unless the authorization is terminated or revoked  sooner.     Influenza A by PCR NEGATIVE NEGATIVE Final   Influenza B by PCR NEGATIVE NEGATIVE Final    Comment: (NOTE) The Xpert Xpress SARS-CoV-2/FLU/RSV assay is intended as an aid in  the diagnosis of influenza from Nasopharyngeal swab specimens and  should not be used as a sole basis for treatment. Nasal washings and  aspirates are unacceptable for Xpert Xpress SARS-CoV-2/FLU/RSV  testing.  Fact Sheet for Patients: https://www.moore.com/  Fact Sheet for Healthcare Providers: https://www.young.biz/  This test is not yet approved or cleared by the [place] FDA and  has been authorized for detection and/or diagnosis of SARS-CoV-2 by  FDA under an Emergency Use Authorization (EUA). This EUA will remain  in effect (meaning this test can be used) for the duration of the  Covid-19 declaration under Section 564(b)(1) of the Act, 21  U.S.C. section 360bbb-3(b)(1), unless the authorization is  terminated or revoked. Performed at [facility], [address] [place], [name] [address]       Studies: No results found.  Scheduled Meds: . atorvastatin  20 mg Oral Daily  . clopidogrel  75 mg Oral Daily  . doxycycline  100 mg Oral Q12H  . enoxaparin (LOVENOX) injection  40 mg Subcutaneous Q24H  . gabapentin  200 mg Oral BID  . guaiFENesin  1,200 mg Oral BID  . hydrALAZINE  100 mg Oral Q8H  . ipratropium-albuterol  3 mL Nebulization TID  . isosorbide mononitrate  30 mg Oral Daily  . losartan  50 mg Oral Daily  . methylPREDNISolone (SOLU-MEDROL) injection  60 mg Intravenous TID  . nicotine  14 mg Transdermal Daily  . potassium chloride  10 mEq Oral BID  . sodium chloride flush  3 mL Intravenous Q12H  . sodium chloride HYPERTONIC  4 mL Nebulization BID  . spironolactone  12.5 mg Oral Daily    Continuous Infusions: . sodium chloride       LOS: 1 day     Darlin Drop, MD [facility] Pager  [phone]  If 7PM-7AM, please contact night-coverage [email or web address] Password [email or web address] 10/16/2020, 2:35 PM

## 2020-10-16 NOTE — Progress Notes (Signed)
Patient refusing to receive medications from this nurse, charge nurse informed.

## 2020-10-16 NOTE — Progress Notes (Signed)
Patient refusing vitals, assessments, and medications. Charge Nurse notified.

## 2020-10-16 NOTE — Progress Notes (Signed)
OT Cancellation Note  Patient Details Name: [name] MRN: [id number] DOB: 11-Feb-1966   Cancelled Treatment:    Reason Eval/Treat Not Completed: Patient declined, reports "it's my breathing, I'm fine with all that stuff" when educated on role of OT. Attempted x2 this morning for OT eval however patient refusing. Notified RN who states patient has been refusing everything this morning. Per PT evaluation patient supervision/mod I level with mobility. Will discontinue acute OT order at this time, please re-order if patient willing to participation/needs identified. Thank you  Marlyce Huge OT OT pager: [phone]   Carmelia Roller 10/16/2020, 11:00 AM

## 2020-10-17 DIAGNOSIS — F1410 Cocaine abuse, uncomplicated: Secondary | ICD-10-CM

## 2020-10-17 DIAGNOSIS — J9601 Acute respiratory failure with hypoxia: Secondary | ICD-10-CM

## 2020-10-17 DIAGNOSIS — J441 Chronic obstructive pulmonary disease with (acute) exacerbation: Secondary | ICD-10-CM

## 2020-10-17 LAB — GLUCOSE, CAPILLARY
Glucose-Capillary: 162 mg/dL — ABNORMAL HIGH (ref 70–99)
Glucose-Capillary: 241 mg/dL — ABNORMAL HIGH (ref 70–99)
Glucose-Capillary: 330 mg/dL — ABNORMAL HIGH (ref 70–99)
Glucose-Capillary: 187 mg/dL — ABNORMAL HIGH (ref 70–99)

## 2020-10-17 LAB — BASIC METABOLIC PANEL
Anion gap: 8 (ref 5–15)
BUN: 31 mg/dL — ABNORMAL HIGH (ref 6–20)
CO2: 24 mmol/L (ref 22–32)
Calcium: 9 mg/dL (ref 8.9–10.3)
Chloride: 105 mmol/L (ref 98–111)
Creatinine, Ser: 1.31 mg/dL — ABNORMAL HIGH (ref 0.61–1.24)
GFR, Estimated: >60 mL/min (ref >60)
Glucose, Bld: 191 mg/dL — ABNORMAL HIGH (ref 70–99)
Potassium: 4.4 mmol/L (ref 3.5–5.1)
Sodium: 137 mmol/L (ref 135–145)

## 2020-10-17 LAB — MAGNESIUM: Magnesium: 1.8 mg/dL (ref 1.7–2.4)

## 2020-10-17 MED ORDER — CLOTRIMAZOLE 1 % EX CREA
TOPICAL_CREAM | Freq: Two times a day (BID) | CUTANEOUS | Status: DC
  Filled 2020-10-17: qty 15

## 2020-10-17 MED ORDER — VITAMINS A & D EX OINT
TOPICAL_OINTMENT | CUTANEOUS | Status: DC | PRN
  Filled 2020-10-17: qty 113

## 2020-10-17 MED ORDER — CYCLOBENZAPRINE HCL 5 MG PO TABS
5.0000 mg | ORAL_TABLET | Freq: Three times a day (TID) | ORAL | Status: DC | PRN
  Administered 2020-10-17 – 2020-10-18 (×2): 5 mg via ORAL
  Filled 2020-10-17 (×2): qty 1

## 2020-10-17 MED ORDER — ALBUTEROL SULFATE (2.5 MG/3ML) 0.083% IN NEBU: 2.5000 mg | INHALATION_SOLUTION | RESPIRATORY_TRACT | Status: DC | PRN

## 2020-10-17 NOTE — Progress Notes (Signed)
PROGRESS NOTE    [name]  IHK:[id number] DOB: [birth year] DOA: 10/14/2020 PCP: Patient, No Pcp Per    Brief Narrative:  54 y.o.malewith medical history significant forCHF, COPD, HTN, prolonged QT interval, cocaine abuse, schizoaffective disorder. Presents for evaluation of SOB that has been worsening over the past few days. He reports having significant increase in work of breathing with minimal excertion. He has not had chest pain or palpitations. He denies any fever. He has had a dry cough the past few days. He has mild swelling of legs that is chronic he states. He has been admitted twice in past month for similar symptoms.Reports he has not been taking his medications at home and does not remember the last time he took them. He continues to smoke. He has history of cocaine use.   Admitted for acute on chronic sCHF, COPD/asthma exacerbation. Started on IV diuretics. Received IV solumedrol and bronchodilators.  UDS + Cocaine on 10/15/20.   Hospital course complicated by noncompliance with medical management.  Assessment & Plan:   Principal Problem:   Acute on chronic combined systolic and diastolic CHF (congestive heart failure) (HCC) Active Problems:   Schizoaffective disorder, bipolar type (HCC)   Essential hypertension   Tobacco use disorder   Cocaine abuse (HCC)   QT prolongation   COPD with acute exacerbation (HCC)   Asthma/COPD exacerbation Presented with significant dyspnea Chest x-ray no active disease, not hypoxic, O2 sat 100% RA Pt is continued on doxycycline, switched IV steroids to prednisone on 10/16/20 Continue bronchodilators as tolerated Cocaine cessation done  This AM, pt appears to have increased resp effort but speaks in full sentences. Lungs have mild end-expiratory wheezing with good air movement O2 sats are normal. On room air  Acute on chronic sCHF Last 2D echo LVEF 40-45% Received IV lasix 40 mg BID Pt is now on po lasix on  10/16/20 Continue current cardiac medications. Strict I&O and daily weight Net I&O -2.8 L  Elevated troponin, likely demand ischemia in the setting of cocaine use Troponin S 19 on 10/15/20 Denies any chest pain at the time of this visit.   Continue to monitor on telemetry  Prolonged QTC QTC 513 at presentation Avoid QTC prolonging agents  Polyneuropathy Resumed home gabapentin  Hyperglycemia, secondary to diet noncompliance.  Hemoglobin A1c 5.9  On 09/09/20 On insulin sliding scale  Cocaine abuse UDS 10/15/20 + Cocaine Cessation counseling was done at bedside  DVT prophylaxis: Lovenox subq Code Status: Full Family Communication: Pt in room, family not at bedside  Status is: Inpatient  Remains inpatient appropriate because:Inpatient level of care appropriate due to severity of illness   Dispo:  Patient From: Group Home  Planned Disposition: Group Home  Expected discharge date: 10/19/20  Medically stable for discharge: No        Consultants:     Procedures:     Antimicrobials: Anti-infectives (From admission, onward)   Start     Dose/Rate Route Frequency Ordered Stop   10/15/20 1000  doxycycline (VIBRA-TABS) tablet 100 mg        100 mg Oral Every 12 hours 10/15/20 0611         Subjective: Complaining of sob but speaking in full sentences  Objective: Vitals:   10/17/20 0632 10/17/20 1103 10/17/20 1159 10/17/20 1457  BP: (!) 164/107  (!) 166/102   Pulse: 84  92   Resp: 19  20   Temp: 97.9 F (36.6 C)  97.7 F (36.5 C)   TempSrc: Oral  Oral   SpO2: 100% 99% 100% 98%  Weight:      Height:        Intake/Output Summary (Last 24 hours) at 10/17/2020 1516 Last data filed at 10/17/2020 1300 Gross per 24 hour  Intake 480 ml  Output 2100 ml  Net -1620 ml   Filed Weights   10/16/20 0500  Weight: 87 kg    Examination:  General exam: Appears calm and comfortable  Respiratory system: Clear to auscultation. Respiratory seems  increased Cardiovascular system: S1 & S2 heard, Regular Gastrointestinal system: Abdomen is nondistended, soft and nontender. No organomegaly or masses felt. Normal bowel sounds heard. Central nervous system: Alert and oriented. No focal neurological deficits. Extremities: Symmetric 5 x 5 power. Skin: No rashes, lesions Psychiatry: Judgement and insight appear normal. Mood & affect appropriate.   Data Reviewed: I have personally reviewed following labs and imaging studies  CBC: Recent Labs  Lab 10/14/20 2023  WBC 7.7  NEUTROABS 4.6  HGB 13.1  HCT 39.0  MCV 98.7  PLT 232   Basic Metabolic Panel: Recent Labs  Lab 10/14/20 2023 10/14/20 2200 10/15/20 0642 10/16/20 0539 10/17/20 0557  NA  --  141  --  137 137  K  --  4.1  --  4.1 4.4  CL  --  109  --  104 105  CO2  --  23  --  22 24  GLUCOSE  --  183*  --  257* 191*  BUN  --  14  --  24* 31*  CREATININE  --  1.37* 1.26* 1.21 1.31*  CALCIUM  --  8.5*  --  9.5 9.0  MG 2.2  --   --   --  1.8   GFR: Estimated Creatinine Clearance: 72 mL/min (A) (by C-G formula based on SCr of 1.31 mg/dL (H)). Liver Function Tests: No results for input(s): AST, ALT, ALKPHOS, BILITOT, PROT, ALBUMIN in the last 168 hours. No results for input(s): LIPASE, AMYLASE in the last 168 hours. No results for input(s): AMMONIA in the last 168 hours. Coagulation Profile: No results for input(s): INR, PROTIME in the last 168 hours. Cardiac Enzymes: No results for input(s): CKTOTAL, CKMB, CKMBINDEX, TROPONINI in the last 168 hours. BNP (last 3 results) No results for input(s): PROBNP in the last 8760 hours. HbA1C: No results for input(s): HGBA1C in the last 72 hours. CBG: Recent Labs  Lab 10/16/20 1713 10/16/20 2051 10/17/20 0324 10/17/20 1155  GLUCAP 345* 284* 162* 241*   Lipid Profile: No results for input(s): CHOL, HDL, LDLCALC, TRIG, CHOLHDL, LDLDIRECT in the last 72 hours. Thyroid Function Tests: No results for input(s): TSH, T4TOTAL,  FREET4, T3FREE, THYROIDAB in the last 72 hours. Anemia Panel: No results for input(s): VITAMINB12, FOLATE, FERRITIN, TIBC, IRON, RETICCTPCT in the last 72 hours. Sepsis Labs: No results for input(s): PROCALCITON, LATICACIDVEN in the last 168 hours.  Recent Results (from the past 240 hour(s))  Respiratory Panel by RT PCR (Flu A&B, Covid) - Nasopharyngeal Swab     Status: None   Collection Time: 10/14/20  8:30 PM   Specimen: Nasopharyngeal Swab  Result Value Ref Range Status   SARS Coronavirus 2 by RT PCR NEGATIVE NEGATIVE Final    Comment: (NOTE) SARS-CoV-2 target nucleic acids are NOT DETECTED.  The SARS-CoV-2 RNA is generally detectable in upper respiratoy specimens during the acute phase of infection. The lowest concentration of SARS-CoV-2 viral copies this assay can detect is 131 copies/mL. A negative result does not preclude SARS-Cov-2  infection and should not be used as the sole basis for treatment or other patient management decisions. A negative result may occur with  improper specimen collection/handling, submission of specimen other than nasopharyngeal swab, presence of viral mutation(s) within the areas targeted by this assay, and inadequate number of viral copies (<131 copies/mL). A negative result must be combined with clinical observations, patient history, and epidemiological information. The expected result is Negative.  Fact Sheet for Patients:  https://www.moore.com/  Fact Sheet for Healthcare Providers:  https://www.young.biz/  This test is no t yet approved or cleared by the [place] FDA and  has been authorized for detection and/or diagnosis of SARS-CoV-2 by FDA under an Emergency Use Authorization (EUA). This EUA will remain  in effect (meaning this test can be used) for the duration of the COVID-19 declaration under Section 564(b)(1) of the Act, 21 U.S.C. section 360bbb-3(b)(1), unless the authorization is  terminated or revoked sooner.     Influenza A by PCR NEGATIVE NEGATIVE Final   Influenza B by PCR NEGATIVE NEGATIVE Final    Comment: (NOTE) The Xpert Xpress SARS-CoV-2/FLU/RSV assay is intended as an aid in  the diagnosis of influenza from Nasopharyngeal swab specimens and  should not be used as a sole basis for treatment. Nasal washings and  aspirates are unacceptable for Xpert Xpress SARS-CoV-2/FLU/RSV  testing.  Fact Sheet for Patients: https://www.moore.com/  Fact Sheet for Healthcare Providers: https://www.young.biz/  This test is not yet approved or cleared by the [place] FDA and  has been authorized for detection and/or diagnosis of SARS-CoV-2 by  FDA under an Emergency Use Authorization (EUA). This EUA will remain  in effect (meaning this test can be used) for the duration of the  Covid-19 declaration under Section 564(b)(1) of the Act, 21  U.S.C. section 360bbb-3(b)(1), unless the authorization is  terminated or revoked. Performed at [facility], [address] [place], [name] [address]      Radiology Studies: No results found.  Scheduled Meds: . atorvastatin  20 mg Oral Daily  . clopidogrel  75 mg Oral Daily  . clotrimazole   Topical BID  . doxycycline  100 mg Oral Q12H  . enoxaparin (LOVENOX) injection  40 mg Subcutaneous Q24H  . furosemide  40 mg Oral Daily  . gabapentin  200 mg Oral BID  . guaiFENesin  1,200 mg Oral BID  . hydrALAZINE  100 mg Oral Q8H  . insulin aspart  0-5 Units Subcutaneous QHS  . insulin aspart  0-9 Units Subcutaneous TID WC  . ipratropium-albuterol  3 mL Nebulization TID  . isosorbide mononitrate  30 mg Oral Daily  . losartan  50 mg Oral Daily  . nicotine  14 mg Transdermal Daily  . potassium chloride  20 mEq Oral Daily  . predniSONE  40 mg Oral Q breakfast  . sodium chloride flush  3 mL Intravenous Q12H  . spironolactone  12.5 mg Oral Daily   Continuous  Infusions: . sodium chloride       LOS: 2 days   Rickey Barbara, MD [facility] Pager On Amion  If 7PM-7AM, please contact night-coverage 10/17/2020, 3:16 PM

## 2020-10-17 NOTE — Progress Notes (Signed)
Oral pain meds did not alleviate or decrease patient's pain.   Trying a hot k-Pad to see if that will help.

## 2020-10-18 LAB — BASIC METABOLIC PANEL
Anion gap: 12 (ref 5–15)
BUN: 29 mg/dL — ABNORMAL HIGH (ref 6–20)
CO2: 22 mmol/L (ref 22–32)
Calcium: 8.8 mg/dL — ABNORMAL LOW (ref 8.9–10.3)
Chloride: 103 mmol/L (ref 98–111)
Creatinine, Ser: 1.31 mg/dL — ABNORMAL HIGH (ref 0.61–1.24)
GFR, Estimated: >60 mL/min (ref >60)
Glucose, Bld: 127 mg/dL — ABNORMAL HIGH (ref 70–99)
Potassium: 3.7 mmol/L (ref 3.5–5.1)
Sodium: 137 mmol/L (ref 135–145)

## 2020-10-18 LAB — GLUCOSE, CAPILLARY
Glucose-Capillary: 214 mg/dL — ABNORMAL HIGH (ref 70–99)
Glucose-Capillary: 261 mg/dL — ABNORMAL HIGH (ref 70–99)

## 2020-10-18 MED ORDER — LOSARTAN POTASSIUM 50 MG PO TABS: 50.0000 mg | ORAL_TABLET | Freq: Every day | ORAL | 0 refills | Status: DC

## 2020-10-18 MED ORDER — SPIRONOLACTONE 25 MG PO TABS: 12.5000 mg | ORAL_TABLET | Freq: Every day | ORAL | 0 refills | Status: DC

## 2020-10-18 MED ORDER — CYCLOBENZAPRINE HCL 5 MG PO TABS: 5.0000 mg | ORAL_TABLET | Freq: Three times a day (TID) | ORAL | 0 refills | Status: AC | PRN

## 2020-10-18 MED ORDER — PREDNISONE 10 MG PO TABS: ORAL_TABLET | ORAL | 0 refills | Status: AC

## 2020-10-18 NOTE — TOC Transition Note (Addendum)
Transition of Care [facility]) - CM/SW Discharge Note   Patient Details  Name: [name] MRN: [id number] Date of Birth: [birth year]  [facility] CM/SW Contact:  Lanier Clam, RN Phone Number: 10/18/2020, 12:10 PM   Clinical Narrative:Spoke to patient in rm about d/c plans-he provided Case manager with a medicaid # [id number] S-provide it to admitting rep Bonnie-researched-it is a behavioral health service coverage, not hospital. Patient asked Case manager to futher discuss the insurance outside of his rm-patient also says Don't worry about where he is going @ d/c. Numerous resources has been provided to patient-but he always refuses to f/u. Nurse is aware of 2 bus passes in shadow chart. No further CM needs.    12:47p-provided nurse with 2- [place], & 1-[place] bus pass given to nurse for patient. No further CM needs.   Final next level of care: Homeless Shelter Barriers to Discharge: No Barriers Identified   Patient Goals and CMS Choice Patient states their goals for this hospitalization and ongoing recovery are:: go to where he is staying CMS Medicare.gov Compare Post Acute Care list provided to:: Patient Choice offered to / list presented to : Patient  Discharge Placement                       Discharge Plan and Services   Discharge Planning Services: CM Consult                                 Social Determinants of Health (SDOH) Interventions     Readmission Risk Interventions Readmission Risk Prevention Plan 10/15/2020 10/05/2020 07/23/2020  Transportation Screening Complete Complete Complete  PCP or Specialist Appt within 3-5 Days - - Complete  HRI or Home Care Consult - - Complete  Social Work Consult for Recovery Care Planning/Counseling - - Complete  Palliative Care Screening - - Not Applicable  Medication Review Oceanographer) Complete Referral to Pharmacy Complete  PCP or Specialist appointment within 3-5 days of discharge Complete Complete  -  HRI or Home Care Consult Complete Patient refused -  SW Recovery Care/Counseling Consult Complete Complete -  Palliative Care Screening Not Applicable Not Applicable -  Skilled Nursing Facility Not Applicable Not Applicable -  Some recent data might be hidden

## 2020-10-18 NOTE — Progress Notes (Signed)
Prior to dc, pt IV was removed and he was given discharge paperwork along with Rx scripts. Pt instructions were reviewed. He verbalized understanding and stated no other concerns at this time. Pt was taken to main entrance in a wheelchair where he left with all his personal belongings. Pt stable at time of DC.

## 2020-10-18 NOTE — Discharge Summary (Signed)
Physician Discharge Summary  Miko Sirico KDX:[id number] DOB: [birth year] DOA: 10/14/2020  PCP: Patient, No Pcp Per  Admit date: 10/14/2020 Discharge date: 10/18/2020  Admitted From: Home Disposition:  Home  Recommendations for Outpatient Follow-up:  1. Follow up with PCP in 1-2 weeks  Discharge Condition:Improved CODE STATUS:Full Diet recommendation: Heart healthy   Brief/Interim Summary: 54 y.o.malewith medical history significant forCHF, COPD, HTN, prolonged QT interval, cocaine abuse, schizoaffective disorder. Presents for evaluation of SOB that has been worsening over the past few days. He reports having significant increase in work of breathing with minimal excertion. He has not had chest pain or palpitations. He denies any fever. He has had a dry cough the past few days. He has mild swelling of legs that is chronic he states. He has been admitted twice in past month for similar symptoms.Reports he has not been taking his medications at home and does not remember the last time he took them. He continues to smoke. He has history of cocaine use.   Admitted for acute on chronic sCHF, COPD/asthma exacerbation. Started on IV diuretics. Received IV solumedrol and bronchodilators.  UDS + Cocaine on 10/15/20.  Hospital course complicated by noncompliance with medical management.  Discharge Diagnoses:  Principal Problem:   Acute on chronic combined systolic and diastolic CHF (congestive heart failure) (HCC) Active Problems:   Schizoaffective disorder, bipolar type (HCC)   Essential hypertension   Tobacco use disorder   Cocaine abuse (HCC)   QT prolongation   COPD with acute exacerbation (HCC)   Asthma/COPD exacerbation Presented with significant dyspnea Chest x-ray no active disease, not hypoxic, O2 sat 100% RA Pt is continued on doxycycline, switchedIV steroids to prednisone on 10/16/20 Continue bronchodilators as tolerated Cocaine cessation done  This AM,  upon entering room, pt was breathing comfortably without issues. During the encounter, pt gradually started to hyperventillate but continued to speak in full sentences. Lungs have good air movement O2 sats are normal. On room air Later, patient was observed ambulating on his own down the hallway to the nursing station with no issues at all  Acute on chronic sCHF Last 2D echo LVEF 40-45% Received IV lasix 40 mg BID Pt is now on po lasix on 10/16/20 Continue current cardiac medications. Net I&O -2.8 L  Elevated troponin, likely demand ischemia in the setting of cocaine use TroponinS19 on 10/15/20 Denies any chest pain at the time of this visit.   Prolonged QTC QTC513atpresentation Avoid QTC prolonging agents  Polyneuropathy Resumed home gabapentin  Hyperglycemia, secondary to diet noncompliance.  Hemoglobin A1c5.9 On 09/09/20 On insulin sliding scale  Cocaine abuse UDS 10/15/20 + Cocaine Cessation counseling was done at bedside  Discharge Instructions   Allergies as of 10/18/2020      Reactions   Ibuprofen Anaphylaxis   Acetaminophen Hives, Nausea And Vomiting, Rash   Pt overdosed on bottle of APAP 05/06/16      Medication List    STOP taking these medications   doxycycline 100 MG tablet Commonly known as: VIBRA-TABS     TAKE these medications   albuterol 108 (90 Base) MCG/ACT inhaler Commonly known as: VENTOLIN HFA Inhale 2 puffs into the lungs every 6 (six) hours as needed for wheezing or shortness of breath.   atorvastatin 20 MG tablet Commonly known as: LIPITOR Take 1 tablet (20 mg total) by mouth daily.   clopidogrel 75 MG tablet Commonly known as: PLAVIX Take 1 tablet (75 mg total) by mouth daily.   cyclobenzaprine 5 MG tablet Commonly known  as: FLEXERIL Take 1 tablet (5 mg total) by mouth 3 (three) times daily as needed for up to 5 days for muscle spasms (back pain).   furosemide 40 MG tablet Commonly known as: Lasix Take 1 tablet  (40 mg total) by mouth daily.   gabapentin 100 MG capsule Commonly known as: NEURONTIN Take 2 capsules (200 mg total) by mouth 2 (two) times daily.   hydrALAZINE 50 MG tablet Commonly known as: APRESOLINE Take 1 tablet (50 mg total) by mouth every 8 (eight) hours.   isosorbide mononitrate 30 MG 24 hr tablet Commonly known as: IMDUR Take 1 tablet (30 mg total) by mouth daily.   losartan 50 MG tablet Commonly known as: COZAAR Take 1 tablet (50 mg total) by mouth daily. Start taking on: October 19, 2020   neomycin-bacitracin-polymyxin ointment Commonly known as: NEOSPORIN Apply topically 2 (two) times daily.   predniSONE 10 MG tablet Commonly known as: DELTASONE Take 4 tablets (40 mg total) by mouth daily with breakfast for 2 days, THEN 2 tablets (20 mg total) daily with breakfast for 2 days, THEN 1 tablet (10 mg total) daily with breakfast for 2 days, THEN 0.5 tablets (5 mg total) daily with breakfast for 2 days. Start taking on: October 18, 2020   senna-docusate 8.6-50 MG tablet Commonly known as: Senokot-S Take 1 tablet by mouth at bedtime as needed for mild constipation.   spironolactone 25 MG tablet Commonly known as: ALDACTONE Take 0.5 tablets (12.5 mg total) by mouth daily. Start taking on: October 19, 2020       Follow-up Information    Primary Care Physician. Schedule an appointment as soon as possible for a visit.        Loss adjuster, chartered. Go to.   Contact information: Homeless shelter [address] [name] [address] [phone]             Allergies  Allergen Reactions  . Ibuprofen Anaphylaxis  . Acetaminophen Hives, Nausea And Vomiting and Rash    Pt overdosed on bottle of APAP 05/06/16      Procedures/Studies: DG Chest 2 View  Result Date: 10/14/2020 CLINICAL DATA:  54 year old male with shortness of breath. EXAM: CHEST - 2 VIEW COMPARISON:  Chest radiograph dated 10/06/2020. FINDINGS: No focal consolidation, pleural  effusion or pneumothorax. Mild cardiomegaly. Atherosclerotic calcification of the aorta. No acute osseous pathology. IMPRESSION: No active cardiopulmonary disease. Electronically Signed   By: Elgie Collard M.D.   On: 10/14/2020 19:13   DG CHEST PORT 1 VIEW  Result Date: 10/06/2020 CLINICAL DATA:  Shortness of breath. EXAM: PORTABLE CHEST 1 VIEW COMPARISON:  10/04/2020 FINDINGS: The heart size and mediastinal contours are within normal limits. Both lungs are clear. The visualized skeletal structures are unremarkable. IMPRESSION: No active disease. Electronically Signed   By: Signa Kell M.D.   On: 10/06/2020 06:36   DG CHEST PORT 1 VIEW  Result Date: 10/04/2020 CLINICAL DATA:  Dyspnea. EXAM: PORTABLE CHEST 1 VIEW COMPARISON:  Prior chest radiograph 10/02/2020. FINDINGS: Unchanged cardiomegaly. No appreciable airspace consolidation or pulmonary edema. No evidence of pleural effusion or pneumothorax. No acute bony abnormality identified. IMPRESSION: Cardiomegaly.  No evidence of acute cardiopulmonary abnormality. Electronically Signed   By: Jackey Loge DO   On: 10/04/2020 08:37   DG Chest Portable 1 View  Result Date: 10/02/2020 CLINICAL DATA:  Shortness of breath EXAM: PORTABLE CHEST 1 VIEW COMPARISON:  09/25/2020 FINDINGS: Cardiomegaly. No confluent opacities, effusions or edema. No acute bony abnormality.  IMPRESSION: Cardiomegaly.  No active disease. Electronically Signed   By: Charlett Nose M.D.   On: 10/02/2020 19:01   DG Chest Portable 1 View  Result Date: 09/25/2020 CLINICAL DATA:  Shortness of breath. EXAM: PORTABLE CHEST 1 VIEW COMPARISON:  09/07/2020 FINDINGS: The lungs are clear without focal pneumonia, edema, pneumothorax or pleural effusion. Interstitial markings are diffusely coarsened with chronic features. Cardiopericardial silhouette is at upper limits of normal for size. The visualized bony structures of the thorax show no acute abnormality. Telemetry leads overlie the chest.  IMPRESSION: Borderline cardiomegaly.  No acute cardiopulmonary findings. Electronically Signed   By: Kennith Center M.D.   On: 09/25/2020 06:44   DG Toe Great Right  Result Date: 09/25/2020 CLINICAL DATA:  Wound on the right great toe. EXAM: RIGHT GREAT TOE COMPARISON:  No comparison studies available. FINDINGS: No fracture. No subluxation or dislocation. No evidence for lucent lesion in the great toe. No perceptible cortical loss in the anatomy of the great toe. IMPRESSION: No radiographic features of osteomyelitis in the right great toe. Electronically Signed   By: Kennith Center M.D.   On: 09/25/2020 07:41   ECHOCARDIOGRAM COMPLETE  Result Date: 09/26/2020    ECHOCARDIOGRAM REPORT   Patient Name:   [name] Date of Exam: 09/26/2020 Medical Rec #:  [id number]      Height:       69.0 in Accession #:    [id number]     Weight:       192.2 lb Date of Birth:  [birth year]      BSA:          2.032 m Patient Age:    54 years       BP:           148/105 mmHg Patient Gender: M              HR:           60 bpm. Exam Location:  Inpatient Procedure: 2D Echo, 3D Echo, Color Doppler and Cardiac Doppler Indications:    R07.9* Chest pain, unspecified  History:        Patient has prior history of Echocardiogram examinations, most                 recent 07/22/2020. CHF, COPD; Risk Factors:Hypertension and                 Polysubstance abuse.  Sonographer:    Irving Burton Senior RDCS Referring Phys: [phone] EKTA V PATEL IMPRESSIONS  1. Left ventricular ejection fraction, by estimation, is 40 to 45%. The left ventricle has mildly decreased function. The left ventricle demonstrates global hypokinesis. The left ventricular internal cavity size was mildly dilated. There is mild concentric left ventricular hypertrophy. Left ventricular diastolic parameters are consistent with Grade II diastolic dysfunction (pseudonormalization).  2. Right ventricular systolic function is normal. The right ventricular size is normal.  3. Left atrial  size was moderately dilated.  4. Right atrial size was mildly dilated.  5. The mitral valve is normal in structure. Trivial mitral valve regurgitation. No evidence of mitral stenosis.  6. The aortic valve is tricuspid. There is mild calcification of the aortic valve. Aortic valve regurgitation is not visualized. Mild aortic valve sclerosis is present, with no evidence of aortic valve stenosis.  7. The inferior vena cava is normal in size with greater than 50% respiratory variability, suggesting right atrial pressure of 3 mmHg. Comparison(s): No significant change from prior study. FINDINGS  Left  Ventricle: Left ventricular ejection fraction, by estimation, is 40 to 45%. The left ventricle has mildly decreased function. The left ventricle demonstrates global hypokinesis. The left ventricular internal cavity size was mildly dilated. There is  mild concentric left ventricular hypertrophy. Left ventricular diastolic parameters are consistent with Grade II diastolic dysfunction (pseudonormalization). Right Ventricle: The right ventricular size is normal. No increase in right ventricular wall thickness. Right ventricular systolic function is normal. Left Atrium: Left atrial size was moderately dilated. Right Atrium: Right atrial size was mildly dilated. Pericardium: Trivial pericardial effusion is present. Mitral Valve: The mitral valve is normal in structure. Trivial mitral valve regurgitation. No evidence of mitral valve stenosis. Tricuspid Valve: The tricuspid valve is normal in structure. Tricuspid valve regurgitation is trivial. No evidence of tricuspid stenosis. Aortic Valve: The aortic valve is tricuspid. There is mild calcification of the aortic valve. Aortic valve regurgitation is not visualized. Mild aortic valve sclerosis is present, with no evidence of aortic valve stenosis. Pulmonic Valve: The pulmonic valve was grossly normal. Pulmonic valve regurgitation is not visualized. No evidence of pulmonic stenosis.  Aorta: The aortic root, ascending aorta and aortic arch are all structurally normal, with no evidence of dilitation or obstruction. Venous: The inferior vena cava is normal in size with greater than 50% respiratory variability, suggesting right atrial pressure of 3 mmHg. IAS/Shunts: The atrial septum is grossly normal.  LEFT VENTRICLE PLAX 2D LVIDd:         5.60 cm  Diastology LVIDs:         4.30 cm  LV e' medial:    6.42 cm/s LV PW:         1.10 cm  LV E/e' medial:  16.8 LV IVS:        1.20 cm  LV e' lateral:   5.55 cm/s LVOT diam:     2.00 cm  LV E/e' lateral: 19.5 LV SV:         64 LV SV Index:   31 LVOT Area:     3.14 cm                          3D Volume EF:                         3D EF:        39 % RIGHT VENTRICLE RV S prime:     9.79 cm/s TAPSE (M-mode): 1.9 cm LEFT ATRIUM             Index       RIGHT ATRIUM           Index LA diam:        4.50 cm 2.22 cm/m  RA Area:     20.50 cm LA Vol (A2C):   89.1 ml 43.86 ml/m RA Volume:   57.50 ml  28.30 ml/m LA Vol (A4C):   99.2 ml 48.83 ml/m LA Biplane Vol: 95.5 ml 47.01 ml/m  AORTIC VALVE LVOT Vmax:   99.90 cm/s LVOT Vmean:  67.500 cm/s LVOT VTI:    0.203 m  AORTA Ao Root diam: 3.00 cm Ao Asc diam:  3.20 cm MITRAL VALVE MV Area (PHT): 2.80 cm     SHUNTS MV Decel Time: 271 msec     Systemic VTI:  0.20 m MV E velocity: 108.00 cm/s  Systemic Diam: 2.00 cm MV A velocity: 66.00 cm/s MV E/A ratio:  1.64 Bridgette Christopher  MD Electronically signed by Jodelle Red MD Signature Date/Time: 09/26/2020/12:20:39 PM    Final     Subjective: Asked for bus pass to go to [facility]  Discharge Exam: Vitals:   10/17/20 2217 10/18/20 0916  BP: (!) 151/90   Pulse: 99   Resp:    Temp:    SpO2: 95% 98%   Vitals:   10/17/20 1457 10/17/20 2050 10/17/20 2217 10/18/20 0916  BP:   (!) 151/90   Pulse:   99   Resp:      Temp:      TempSrc:      SpO2: 98% 98% 95% 98%  Weight:      Height:        General: Pt is alert, awake, not in acute  distress Cardiovascular: RRR, S1/S2 +, no rubs, no gallops Respiratory: CTA bilaterally, no wheezing, no rhonchi Abdominal: Soft, NT, ND, bowel sounds + Extremities: no edema, no cyanosis   The results of significant diagnostics from this hospitalization (including imaging, microbiology, ancillary and laboratory) are listed below for reference.     Microbiology: Recent Results (from the past 240 hour(s))  Respiratory Panel by RT PCR (Flu A&B, Covid) - Nasopharyngeal Swab     Status: None   Collection Time: 10/14/20  8:30 PM   Specimen: Nasopharyngeal Swab  Result Value Ref Range Status   SARS Coronavirus 2 by RT PCR NEGATIVE NEGATIVE Final    Comment: (NOTE) SARS-CoV-2 target nucleic acids are NOT DETECTED.  The SARS-CoV-2 RNA is generally detectable in upper respiratoy specimens during the acute phase of infection. The lowest concentration of SARS-CoV-2 viral copies this assay can detect is 131 copies/mL. A negative result does not preclude SARS-Cov-2 infection and should not be used as the sole basis for treatment or other patient management decisions. A negative result may occur with  improper specimen collection/handling, submission of specimen other than nasopharyngeal swab, presence of viral mutation(s) within the areas targeted by this assay, and inadequate number of viral copies (<131 copies/mL). A negative result must be combined with clinical observations, patient history, and epidemiological information. The expected result is Negative.  Fact Sheet for Patients:  https://www.moore.com/  Fact Sheet for Healthcare Providers:  https://www.young.biz/  This test is no t yet approved or cleared by the [place] FDA and  has been authorized for detection and/or diagnosis of SARS-CoV-2 by FDA under an Emergency Use Authorization (EUA). This EUA will remain  in effect (meaning this test can be used) for the duration of  the COVID-19 declaration under Section 564(b)(1) of the Act, 21 U.S.C. section 360bbb-3(b)(1), unless the authorization is terminated or revoked sooner.     Influenza A by PCR NEGATIVE NEGATIVE Final   Influenza B by PCR NEGATIVE NEGATIVE Final    Comment: (NOTE) The Xpert Xpress SARS-CoV-2/FLU/RSV assay is intended as an aid in  the diagnosis of influenza from Nasopharyngeal swab specimens and  should not be used as a sole basis for treatment. Nasal washings and  aspirates are unacceptable for Xpert Xpress SARS-CoV-2/FLU/RSV  testing.  Fact Sheet for Patients: https://www.moore.com/  Fact Sheet for Healthcare Providers: https://www.young.biz/  This test is not yet approved or cleared by the [place] FDA and  has been authorized for detection and/or diagnosis of SARS-CoV-2 by  FDA under an Emergency Use Authorization (EUA). This EUA will remain  in effect (meaning this test can be used) for the duration of the  Covid-19 declaration under Section 564(b)(1) of the Act, 21  U.S.C.  section 360bbb-3(b)(1), unless the authorization is  terminated or revoked. Performed at [facility], [address], [place], [name] [address]      Labs: BNP (last 3 results) Recent Labs    10/04/20 0840 10/05/20 0515 10/14/20 2023  BNP 325.5* 356.6* 1,183.4*   Basic Metabolic Panel: Recent Labs  Lab 10/14/20 2023 10/14/20 2200 10/15/20 0642 10/16/20 0539 10/17/20 0557 10/18/20 0524  NA  --  141  --  137 137 137  K  --  4.1  --  4.1 4.4 3.7  CL  --  109  --  104 105 103  CO2  --  23  --  22 24 22   GLUCOSE  --  183*  --  257* 191* 127*  BUN  --  14  --  24* 31* 29*  CREATININE  --  1.37* 1.26* 1.21 1.31* 1.31*  CALCIUM  --  8.5*  --  9.5 9.0 8.8*  MG 2.2  --   --   --  1.8  --    Liver Function Tests: No results for input(s): AST, ALT, ALKPHOS, BILITOT, PROT, ALBUMIN in the last 168 hours. No results for input(s):  LIPASE, AMYLASE in the last 168 hours. No results for input(s): AMMONIA in the last 168 hours. CBC: Recent Labs  Lab 10/14/20 2023  WBC 7.7  NEUTROABS 4.6  HGB 13.1  HCT 39.0  MCV 98.7  PLT 232   Cardiac Enzymes: No results for input(s): CKTOTAL, CKMB, CKMBINDEX, TROPONINI in the last 168 hours. BNP: Invalid input(s): POCBNP CBG: Recent Labs  Lab 10/17/20 1155 10/17/20 1658 10/17/20 2308 10/18/20 0758 10/18/20 1133  GLUCAP 241* 330* 187* 214* 261*   D-Dimer No results for input(s): DDIMER in the last 72 hours. Hgb A1c No results for input(s): HGBA1C in the last 72 hours. Lipid Profile No results for input(s): CHOL, HDL, LDLCALC, TRIG, CHOLHDL, LDLDIRECT in the last 72 hours. Thyroid function studies No results for input(s): TSH, T4TOTAL, T3FREE, THYROIDAB in the last 72 hours.  Invalid input(s): FREET3 Anemia work up No results for input(s): VITAMINB12, FOLATE, FERRITIN, TIBC, IRON, RETICCTPCT in the last 72 hours. Urinalysis    Component Value Date/Time   COLORURINE YELLOW 08/22/2020 1127   APPEARANCEUR CLEAR 08/22/2020 1127   LABSPEC 1.010 08/22/2020 1127   PHURINE 5.0 08/22/2020 1127   GLUCOSEU NEGATIVE 08/22/2020 1127   HGBUR NEGATIVE 08/22/2020 1127   BILIRUBINUR NEGATIVE 08/22/2020 1127   KETONESUR NEGATIVE 08/22/2020 1127   PROTEINUR NEGATIVE 08/22/2020 1127   NITRITE NEGATIVE 08/22/2020 1127   LEUKOCYTESUR NEGATIVE 08/22/2020 1127   Sepsis Labs Invalid input(s): PROCALCITONIN,  WBC,  LACTICIDVEN Microbiology Recent Results (from the past 240 hour(s))  Respiratory Panel by RT PCR (Flu A&B, Covid) - Nasopharyngeal Swab     Status: None   Collection Time: 10/14/20  8:30 PM   Specimen: Nasopharyngeal Swab  Result Value Ref Range Status   SARS Coronavirus 2 by RT PCR NEGATIVE NEGATIVE Final    Comment: (NOTE) SARS-CoV-2 target nucleic acids are NOT DETECTED.  The SARS-CoV-2 RNA is generally detectable in upper respiratoy specimens during the acute  phase of infection. The lowest concentration of SARS-CoV-2 viral copies this assay can detect is 131 copies/mL. A negative result does not preclude SARS-Cov-2 infection and should not be used as the sole basis for treatment or other patient management decisions. A negative result may occur with  improper specimen collection/handling, submission of specimen other than nasopharyngeal swab, presence of viral mutation(s) within  the areas targeted by this assay, and inadequate number of viral copies (<131 copies/mL). A negative result must be combined with clinical observations, patient history, and epidemiological information. The expected result is Negative.  Fact Sheet for Patients:  https://www.moore.com/  Fact Sheet for Healthcare Providers:  https://www.young.biz/  This test is no t yet approved or cleared by the [place] FDA and  has been authorized for detection and/or diagnosis of SARS-CoV-2 by FDA under an Emergency Use Authorization (EUA). This EUA will remain  in effect (meaning this test can be used) for the duration of the COVID-19 declaration under Section 564(b)(1) of the Act, 21 U.S.C. section 360bbb-3(b)(1), unless the authorization is terminated or revoked sooner.     Influenza A by PCR NEGATIVE NEGATIVE Final   Influenza B by PCR NEGATIVE NEGATIVE Final    Comment: (NOTE) The Xpert Xpress SARS-CoV-2/FLU/RSV assay is intended as an aid in  the diagnosis of influenza from Nasopharyngeal swab specimens and  should not be used as a sole basis for treatment. Nasal washings and  aspirates are unacceptable for Xpert Xpress SARS-CoV-2/FLU/RSV  testing.  Fact Sheet for Patients: https://www.moore.com/  Fact Sheet for Healthcare Providers: https://www.young.biz/  This test is not yet approved or cleared by the [place] FDA and  has been authorized for detection and/or diagnosis of  SARS-CoV-2 by  FDA under an Emergency Use Authorization (EUA). This EUA will remain  in effect (meaning this test can be used) for the duration of the  Covid-19 declaration under Section 564(b)(1) of the Act, 21  U.S.C. section 360bbb-3(b)(1), unless the authorization is  terminated or revoked. Performed at [facility], [address], [place], [name] [address]    Time spent:  SIGNED:   Rickey Barbara, MD  [facility] 10/18/2020, 5:28 PM  If 7PM-7AM, please contact night-coverage

## 2020-10-18 NOTE — Progress Notes (Signed)
Pt. refused full nursing assessment. Partial assessment completed. Also refused 2200 BP medication and uncooperative for morning vitals. Vitals not completed. Pt. Agitated most of the shift. Pt. Calm and resting currently.

## 2020-10-26 ENCOUNTER — Other Ambulatory Visit: Payer: Self-pay

## 2020-10-26 ENCOUNTER — Encounter (HOSPITAL_COMMUNITY): Payer: Self-pay

## 2020-10-26 ENCOUNTER — Emergency Department (HOSPITAL_COMMUNITY): Payer: Medicaid - Out of State

## 2020-10-26 ENCOUNTER — Inpatient Hospital Stay (HOSPITAL_COMMUNITY)
Admission: EM | Admit: 2020-10-26 | Discharge: 2020-10-31 | DRG: 291 | Discharge status: Against Medical Advice | Payer: Medicaid - Out of State | Attending: Internal Medicine | Admitting: Internal Medicine

## 2020-10-26 DIAGNOSIS — Z79899 Other long term (current) drug therapy: Secondary | ICD-10-CM

## 2020-10-26 DIAGNOSIS — T380X5A Adverse effect of glucocorticoids and synthetic analogues, initial encounter: Secondary | ICD-10-CM | POA: Diagnosis not present

## 2020-10-26 DIAGNOSIS — J441 Chronic obstructive pulmonary disease with (acute) exacerbation: Secondary | ICD-10-CM | POA: Diagnosis present

## 2020-10-26 DIAGNOSIS — Z7902 Long term (current) use of antithrombotics/antiplatelets: Secondary | ICD-10-CM | POA: Diagnosis not present

## 2020-10-26 DIAGNOSIS — E876 Hypokalemia: Secondary | ICD-10-CM

## 2020-10-26 DIAGNOSIS — R9431 Abnormal electrocardiogram [ECG] [EKG]: Secondary | ICD-10-CM | POA: Diagnosis present

## 2020-10-26 DIAGNOSIS — Z813 Family history of other psychoactive substance abuse and dependence: Secondary | ICD-10-CM

## 2020-10-26 DIAGNOSIS — J209 Acute bronchitis, unspecified: Secondary | ICD-10-CM

## 2020-10-26 DIAGNOSIS — I10 Essential (primary) hypertension: Secondary | ICD-10-CM

## 2020-10-26 DIAGNOSIS — Z5900 Homelessness unspecified: Secondary | ICD-10-CM | POA: Diagnosis not present

## 2020-10-26 DIAGNOSIS — F1010 Alcohol abuse, uncomplicated: Secondary | ICD-10-CM | POA: Diagnosis present

## 2020-10-26 DIAGNOSIS — K219 Gastro-esophageal reflux disease without esophagitis: Secondary | ICD-10-CM | POA: Diagnosis present

## 2020-10-26 DIAGNOSIS — Z20822 Contact with and (suspected) exposure to covid-19: Secondary | ICD-10-CM | POA: Diagnosis present

## 2020-10-26 DIAGNOSIS — F250 Schizoaffective disorder, bipolar type: Secondary | ICD-10-CM | POA: Diagnosis present

## 2020-10-26 DIAGNOSIS — E1165 Type 2 diabetes mellitus with hyperglycemia: Secondary | ICD-10-CM | POA: Diagnosis not present

## 2020-10-26 DIAGNOSIS — F1414 Cocaine abuse with cocaine-induced mood disorder: Secondary | ICD-10-CM | POA: Diagnosis present

## 2020-10-26 DIAGNOSIS — Z9119 Patient's noncompliance with other medical treatment and regimen: Secondary | ICD-10-CM

## 2020-10-26 DIAGNOSIS — J9601 Acute respiratory failure with hypoxia: Secondary | ICD-10-CM

## 2020-10-26 DIAGNOSIS — I5043 Acute on chronic combined systolic (congestive) and diastolic (congestive) heart failure: Secondary | ICD-10-CM | POA: Diagnosis present

## 2020-10-26 DIAGNOSIS — M545 Low back pain, unspecified: Secondary | ICD-10-CM

## 2020-10-26 DIAGNOSIS — E785 Hyperlipidemia, unspecified: Secondary | ICD-10-CM | POA: Diagnosis present

## 2020-10-26 DIAGNOSIS — I110 Hypertensive heart disease with heart failure: Secondary | ICD-10-CM | POA: Diagnosis present

## 2020-10-26 DIAGNOSIS — F1210 Cannabis abuse, uncomplicated: Secondary | ICD-10-CM | POA: Diagnosis present

## 2020-10-26 DIAGNOSIS — F17210 Nicotine dependence, cigarettes, uncomplicated: Secondary | ICD-10-CM | POA: Diagnosis present

## 2020-10-26 DIAGNOSIS — J440 Chronic obstructive pulmonary disease with acute lower respiratory infection: Secondary | ICD-10-CM | POA: Diagnosis present

## 2020-10-26 DIAGNOSIS — I160 Hypertensive urgency: Secondary | ICD-10-CM | POA: Diagnosis present

## 2020-10-26 DIAGNOSIS — Z886 Allergy status to analgesic agent status: Secondary | ICD-10-CM

## 2020-10-26 DIAGNOSIS — R0602 Shortness of breath: Secondary | ICD-10-CM

## 2020-10-26 DIAGNOSIS — J449 Chronic obstructive pulmonary disease, unspecified: Secondary | ICD-10-CM | POA: Diagnosis present

## 2020-10-26 DIAGNOSIS — Z5329 Procedure and treatment not carried out because of patient's decision for other reasons: Secondary | ICD-10-CM | POA: Diagnosis present

## 2020-10-26 DIAGNOSIS — I5023 Acute on chronic systolic (congestive) heart failure: Secondary | ICD-10-CM

## 2020-10-26 DIAGNOSIS — R000 Tachycardia, unspecified: Secondary | ICD-10-CM | POA: Diagnosis present

## 2020-10-26 LAB — CBC
HCT: 38.5 % — ABNORMAL LOW (ref 39.0–52.0)
Hemoglobin: 12.4 g/dL — ABNORMAL LOW (ref 13.0–17.0)
MCH: 33.1 pg (ref 26.0–34.0)
MCHC: 32.2 g/dL (ref 30.0–36.0)
MCV: 102.7 fL — ABNORMAL HIGH (ref 80.0–100.0)
Platelets: 235 10*3/uL (ref 150–400)
RBC: 3.75 MIL/uL — ABNORMAL LOW (ref 4.22–5.81)
RDW: 15.9 % — ABNORMAL HIGH (ref 11.5–15.5)
WBC: 7.6 10*3/uL (ref 4.0–10.5)
nRBC: 0 % (ref 0.0–0.2)

## 2020-10-26 LAB — BASIC METABOLIC PANEL
Anion gap: 9 (ref 5–15)
BUN: 16 mg/dL (ref 6–20)
CO2: 22 mmol/L (ref 22–32)
Calcium: 7.8 mg/dL — ABNORMAL LOW (ref 8.9–10.3)
Chloride: 108 mmol/L (ref 98–111)
Creatinine, Ser: 1.13 mg/dL (ref 0.61–1.24)
GFR, Estimated: >60 mL/min (ref >60)
Glucose, Bld: 90 mg/dL (ref 70–99)
Potassium: 3.3 mmol/L — ABNORMAL LOW (ref 3.5–5.1)
Sodium: 139 mmol/L (ref 135–145)

## 2020-10-26 LAB — MAGNESIUM: Magnesium: 2.5 mg/dL — ABNORMAL HIGH (ref 1.7–2.4)

## 2020-10-26 LAB — GLUCOSE, CAPILLARY
Glucose-Capillary: 203 mg/dL — ABNORMAL HIGH (ref 70–99)
Glucose-Capillary: 240 mg/dL — ABNORMAL HIGH (ref 70–99)

## 2020-10-26 LAB — RESP PANEL BY RT-PCR (FLU A&B, COVID) ARPGX2
Influenza A by PCR: NEGATIVE
Influenza B by PCR: NEGATIVE
SARS Coronavirus 2 by RT PCR: NEGATIVE

## 2020-10-26 LAB — BRAIN NATRIURETIC PEPTIDE: B Natriuretic Peptide: 1075.9 pg/mL — ABNORMAL HIGH (ref 0.0–100.0)

## 2020-10-26 LAB — TROPONIN I (HIGH SENSITIVITY): Troponin I (High Sensitivity): 36 ng/L — ABNORMAL HIGH (ref <18)

## 2020-10-26 IMAGING — DX DG CHEST 1V PORT
1 series · 1 of 1 positions shown · non-contrast
Comparison: Prior chest radiographs [DATE].

CLINICAL DATA: "TIGER." Additional history obtained from electronic
medical record: Shortness of breath for 3 days, worsening this
morning, history of CHF and COPD, patient reports rib and lower back
pain.

EXAM:
PORTABLE CHEST 1 VIEW

[chest ap]
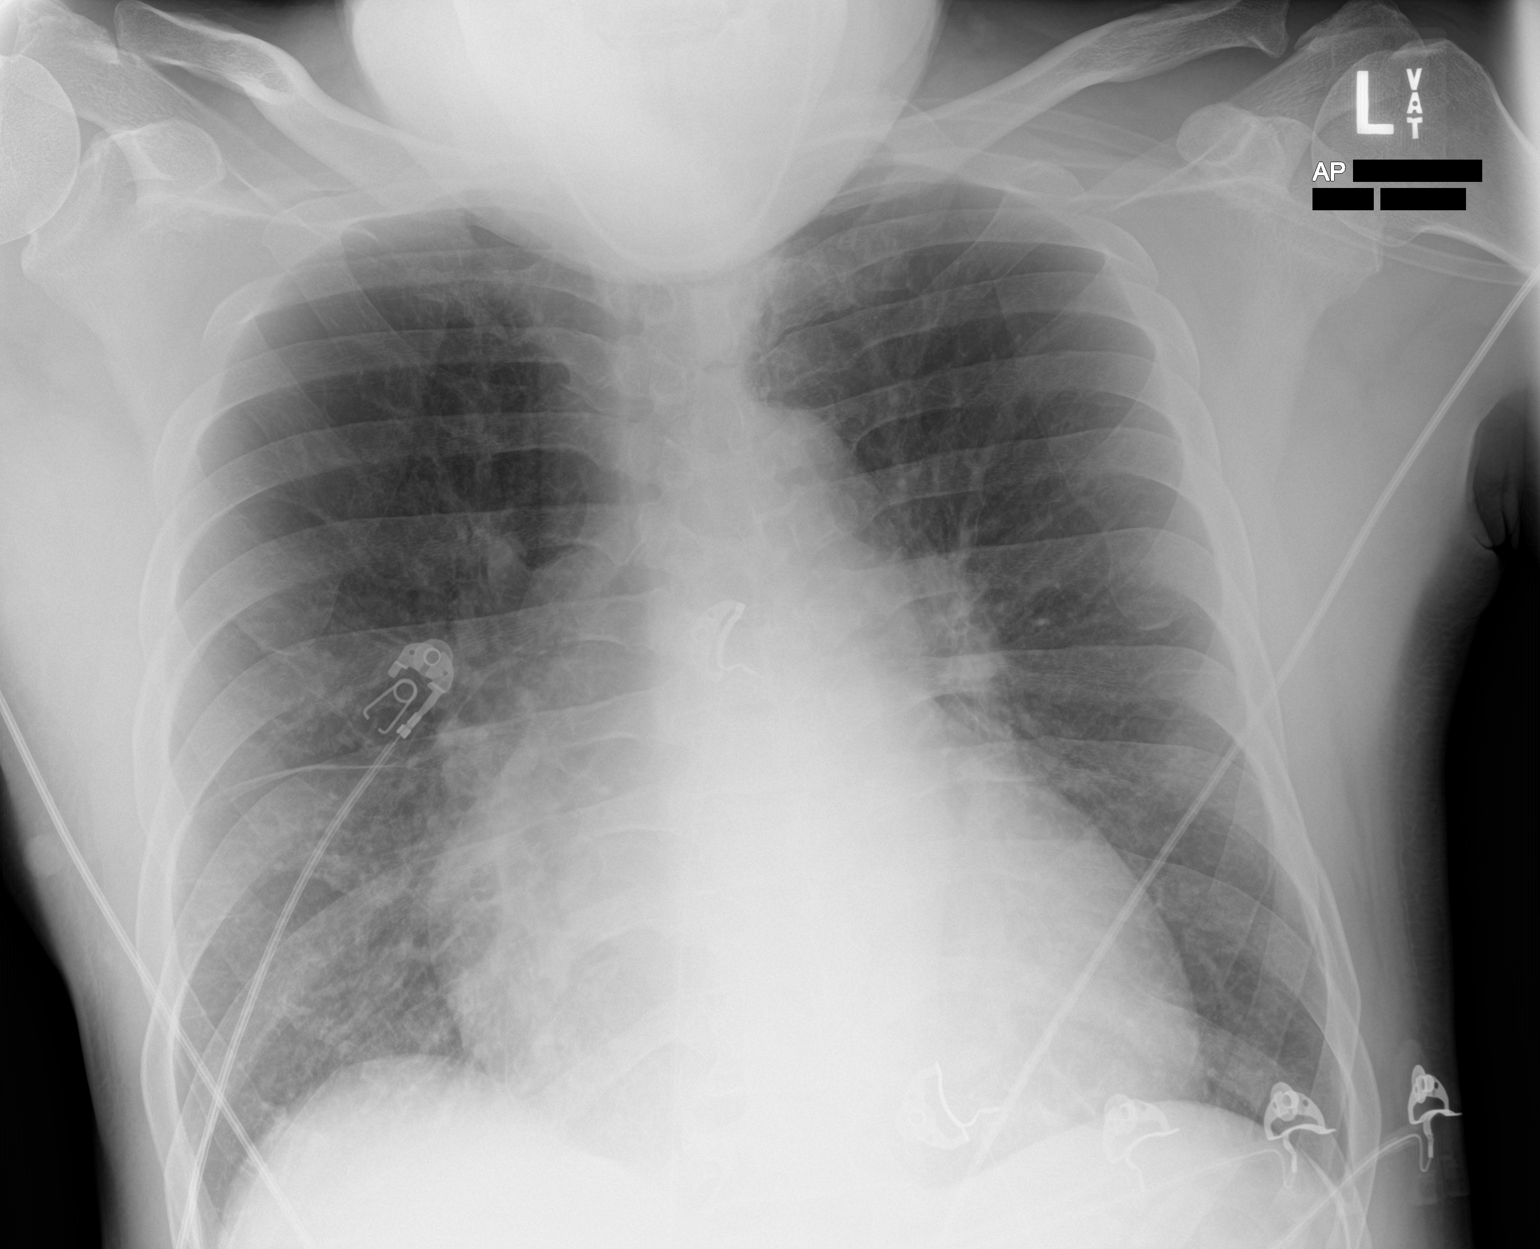

[1 of 1 positions shown; findings below may reference images not displayed]

FINDINGS: Unchanged cardiomegaly. The patient's chin partially obscures the
medial lung apices. Subtle interstitial opacities within the right
mid to lower lung field. The left lung is clear. No evidence of
pleural effusion or pneumothorax. No acute bony abnormality
identified.
IMPRESSION: The patient's chin partially obscures the medial lung apices

Unchanged cardiomegaly.

Subtle interstitial opacities within the right mid to lower lung
field. Primary considerations include incomplete atelectasis,
asymmetric edema or atypical/viral infection.

## 2020-10-26 MED ORDER — ALBUTEROL (5 MG/ML) CONTINUOUS INHALATION SOLN
10.0000 mg/h | INHALATION_SOLUTION | Freq: Once | RESPIRATORY_TRACT | Status: AC
  Administered 2020-10-26: 10 mg/h via RESPIRATORY_TRACT
  Filled 2020-10-26: qty 20

## 2020-10-26 MED ORDER — POTASSIUM CHLORIDE CRYS ER 20 MEQ PO TBCR
40.0000 meq | EXTENDED_RELEASE_TABLET | Freq: Once | ORAL | Status: AC
  Administered 2020-10-26: 40 meq via ORAL
  Filled 2020-10-26: qty 2

## 2020-10-26 MED ORDER — FUROSEMIDE 10 MG/ML IJ SOLN
40.0000 mg | Freq: Two times a day (BID) | INTRAMUSCULAR | Status: DC
  Administered 2020-10-26 – 2020-10-28 (×4): 40 mg via INTRAVENOUS
  Filled 2020-10-26 (×5): qty 4

## 2020-10-26 MED ORDER — IPRATROPIUM-ALBUTEROL 0.5-2.5 (3) MG/3ML IN SOLN
RESPIRATORY_TRACT | Status: AC
  Filled 2020-10-26: qty 3

## 2020-10-26 MED ORDER — IPRATROPIUM BROMIDE 0.02 % IN SOLN
0.5000 mg | Freq: Once | RESPIRATORY_TRACT | Status: AC
  Administered 2020-10-26: 0.5 mg via RESPIRATORY_TRACT
  Filled 2020-10-26: qty 2.5

## 2020-10-26 MED ORDER — FUROSEMIDE 10 MG/ML IJ SOLN: 40.0000 mg | Freq: Two times a day (BID) | INTRAMUSCULAR | Status: DC

## 2020-10-26 MED ORDER — ALBUTEROL SULFATE (2.5 MG/3ML) 0.083% IN NEBU: 2.5000 mg | INHALATION_SOLUTION | RESPIRATORY_TRACT | Status: DC | PRN

## 2020-10-26 MED ORDER — PREDNISONE 20 MG PO TABS
40.0000 mg | ORAL_TABLET | Freq: Every day | ORAL | Status: DC
  Administered 2020-10-27: 40 mg via ORAL
  Filled 2020-10-26: qty 2

## 2020-10-26 MED ORDER — IPRATROPIUM-ALBUTEROL 0.5-2.5 (3) MG/3ML IN SOLN
3.0000 mL | Freq: Four times a day (QID) | RESPIRATORY_TRACT | Status: DC
  Administered 2020-10-26 (×2): 3 mL via RESPIRATORY_TRACT
  Filled 2020-10-26 (×2): qty 3

## 2020-10-26 MED ORDER — HYDRALAZINE HCL 25 MG PO TABS
50.0000 mg | ORAL_TABLET | Freq: Three times a day (TID) | ORAL | Status: DC
  Administered 2020-10-26 – 2020-10-30 (×11): 50 mg via ORAL
  Filled 2020-10-26 (×11): qty 2

## 2020-10-26 MED ORDER — ISOSORBIDE MONONITRATE ER 30 MG PO TB24
30.0000 mg | ORAL_TABLET | Freq: Every day | ORAL | Status: DC
  Administered 2020-10-26 – 2020-10-31 (×6): 30 mg via ORAL
  Filled 2020-10-26 (×6): qty 1

## 2020-10-26 MED ORDER — UMECLIDINIUM BROMIDE 62.5 MCG/INH IN AEPB: 1.0000 | INHALATION_SPRAY | Freq: Every day | RESPIRATORY_TRACT | Status: DC

## 2020-10-26 MED ORDER — FLUTICASONE FUROATE-VILANTEROL 100-25 MCG/INH IN AEPB
1.0000 | INHALATION_SPRAY | Freq: Every day | RESPIRATORY_TRACT | Status: DC
  Administered 2020-10-27: 1 via RESPIRATORY_TRACT
  Filled 2020-10-26: qty 28

## 2020-10-26 MED ORDER — DOXYCYCLINE HYCLATE 100 MG PO TABS
100.0000 mg | ORAL_TABLET | Freq: Once | ORAL | Status: AC
  Administered 2020-10-26: 100 mg via ORAL
  Filled 2020-10-26: qty 1

## 2020-10-26 MED ORDER — ALBUTEROL SULFATE HFA 108 (90 BASE) MCG/ACT IN AERS
4.0000 | INHALATION_SPRAY | Freq: Once | RESPIRATORY_TRACT | Status: AC
  Administered 2020-10-26: 4 via RESPIRATORY_TRACT

## 2020-10-26 MED ORDER — MIDAZOLAM HCL 2 MG/2ML IJ SOLN
1.0000 mg | Freq: Once | INTRAMUSCULAR | Status: DC
  Filled 2020-10-26 (×2): qty 2

## 2020-10-26 MED ORDER — DIVALPROEX SODIUM ER 500 MG PO TB24
500.0000 mg | ORAL_TABLET | Freq: Two times a day (BID) | ORAL | Status: DC
  Administered 2020-10-27 – 2020-10-30 (×2): 500 mg via ORAL
  Filled 2020-10-26 (×9): qty 1

## 2020-10-26 MED ORDER — GABAPENTIN 100 MG PO CAPS
200.0000 mg | ORAL_CAPSULE | Freq: Two times a day (BID) | ORAL | Status: DC
  Administered 2020-10-26 – 2020-10-31 (×10): 200 mg via ORAL
  Filled 2020-10-26 (×10): qty 2

## 2020-10-26 MED ORDER — METHYLPREDNISOLONE SODIUM SUCC 125 MG IJ SOLR: 125.0000 mg | Freq: Once | INTRAMUSCULAR | Status: DC

## 2020-10-26 MED ORDER — MIDAZOLAM HCL 2 MG/2ML IJ SOLN
INTRAMUSCULAR | Status: AC
  Administered 2020-10-26: 1 mg
  Filled 2020-10-26: qty 2

## 2020-10-26 MED ORDER — ENOXAPARIN SODIUM 40 MG/0.4ML ~~LOC~~ SOLN
40.0000 mg | SUBCUTANEOUS | Status: DC
  Administered 2020-10-28 – 2020-10-31 (×3): 40 mg via SUBCUTANEOUS
  Filled 2020-10-26 (×4): qty 0.4

## 2020-10-26 MED ORDER — OXYCODONE HCL 5 MG PO TABS
5.0000 mg | ORAL_TABLET | Freq: Four times a day (QID) | ORAL | Status: DC | PRN
  Administered 2020-10-26 – 2020-10-27 (×4): 5 mg via ORAL
  Filled 2020-10-26 (×4): qty 1

## 2020-10-26 MED ORDER — FUROSEMIDE 10 MG/ML IJ SOLN
40.0000 mg | Freq: Once | INTRAMUSCULAR | Status: DC
  Filled 2020-10-26: qty 4

## 2020-10-26 MED ORDER — CLOPIDOGREL BISULFATE 75 MG PO TABS
75.0000 mg | ORAL_TABLET | Freq: Every day | ORAL | Status: DC
  Administered 2020-10-27 – 2020-10-31 (×5): 75 mg via ORAL
  Filled 2020-10-26 (×5): qty 1

## 2020-10-26 MED ORDER — INSULIN ASPART 100 UNIT/ML ~~LOC~~ SOLN: 0.0000 [IU] | Freq: Three times a day (TID) | SUBCUTANEOUS | Status: DC

## 2020-10-26 MED ORDER — METHOCARBAMOL 500 MG PO TABS
500.0000 mg | ORAL_TABLET | Freq: Once | ORAL | Status: AC
  Administered 2020-10-26: 500 mg via ORAL
  Filled 2020-10-26: qty 1

## 2020-10-26 NOTE — ED Provider Notes (Addendum)
[facility]-EMERGENCY DEPT Provider Note   CSN: [id number] Arrival date & time: 10/26/20  0710     History Chief Complaint  Patient presents with  . Respiratory Distress    [name] is a 54 y.o. male.  HPI     54 year old male with history of diabetes, CHF, COPD, schizoaffective disorder comes in a chief complaint of shortness of breath.  According to the patient has been feeling short of breath for the last couple of days.  This morning his symptoms acutely worsened.  Per EMS patient's O2 sats were 79%.  Patient does not have history of oxygen use at home.  Patient thinks that his symptoms are because of congestive heart failure.  His symptoms are worse with ambulation and with him laying flat.  Patient denies any substance use.  He has no chest pain.  Positive cough, mostly nonproductive.  No sick contacts.  Past Medical History:  Diagnosis Date  . Asthma   . Bipolar 1 disorder (HCC)   . CHF (congestive heart failure) (HCC)   . COPD (chronic obstructive pulmonary disease) (HCC)   . Depression   . Diabetes mellitus without complication (HCC)   . GERD (gastroesophageal reflux disease)   . Homelessness   . Hypertension   . QT prolongation   . Schizoaffective disorder [facility])     Patient Active Problem List   Diagnosis Date Noted  . COPD with acute exacerbation (HCC) 10/14/2020  . Acute on chronic combined systolic and diastolic CHF (congestive heart failure) (HCC) 10/02/2020  . Near syncope 07/21/2020  . Schizoaffective disorder (HCC) 06/24/2019  . SOB (shortness of breath)   . QT prolongation 05/25/2019  . Cocaine abuse (HCC)   . Elevated troponin   . Homelessness   . Suicidal ideation   . Bilateral foot pain 04/05/2019  . Housing situation unstable 03/28/2019  . Bilateral lower extremity edema   . Asthma 12/26/2016  . Tobacco use disorder 12/26/2016  . Schizophrenia (HCC) 12/26/2016  . Cocaine abuse with cocaine-induced mood disorder  (HCC) 09/06/2016  . Alcohol use disorder, mild, abuse 07/31/2016  . Substance induced mood disorder (HCC) 07/30/2016  . Schizoaffective disorder, bipolar type (HCC) 05/31/2016  . Cocaine use disorder, severe, dependence (HCC) 05/31/2016  . Essential hypertension 05/31/2016    History reviewed. No pertinent surgical history.     Family History  Problem Relation Age of Onset  . Drug abuse Mother   . Suicidality Mother     Social History   Tobacco Use  . Smoking status: Current Every Day Smoker    Years: 30.00    Types: Cigarettes  . Smokeless tobacco: Never Used  Vaping Use  . Vaping Use: Never used  Substance Use Topics  . Alcohol use: Yes    Comment: infrequently  . Drug use: Yes    Types: Cocaine, Marijuana    Home Medications Prior to Admission medications   Medication Sig Start Date End Date Taking? Authorizing Provider  carvedilol (COREG) 3.125 MG tablet Take 3.125 mg by mouth 2 (two) times daily with a meal.   Yes [provider]  cloNIDine (CATAPRES) 0.1 MG tablet Take 0.1 mg by mouth 3 (three) times daily.   Yes [provider]  albuterol (VENTOLIN HFA) 108 (90 Base) MCG/ACT inhaler Inhale 2 puffs into the lungs every 6 (six) hours as needed for wheezing or shortness of breath. 09/26/20   Leatha Gilding, MD  atorvastatin (LIPITOR) 20 MG tablet Take 1 tablet (20 mg total)  by mouth daily. 10/07/20   Marguerita Merles Latif, DO  clopidogrel (PLAVIX) 75 MG tablet Take 1 tablet (75 mg total) by mouth daily. 10/08/20   Marguerita Merles Latif, DO  divalproex (DEPAKOTE ER) 500 MG 24 hr tablet Take 500 mg by mouth in the morning and at bedtime.    [provider]  doxycycline (VIBRA-TABS) 100 MG tablet Take 100 mg by mouth 2 (two) times daily.    [provider]  furosemide (LASIX) 40 MG tablet Take 1 tablet (40 mg total) by mouth daily. 10/07/20 11/06/20  Marguerita Merles Latif, DO  gabapentin (NEURONTIN) 100 MG capsule Take 2 capsules (200 mg  total) by mouth 2 (two) times daily. 10/07/20   Marguerita Merles Latif, DO  hydrALAZINE (APRESOLINE) 50 MG tablet Take 1 tablet (50 mg total) by mouth every 8 (eight) hours. Patient not taking: Reported on 10/15/2020 10/07/20   Marguerita Merles Latif, DO  isosorbide mononitrate (IMDUR) 30 MG 24 hr tablet Take 1 tablet (30 mg total) by mouth daily. 10/07/20 11/06/20  Marguerita Merles Latif, DO  lisinopril (ZESTRIL) 20 MG tablet Take 20 mg by mouth in the morning and at bedtime.    [provider]  losartan (COZAAR) 50 MG tablet Take 1 tablet (50 mg total) by mouth daily. 10/19/20 11/18/20  Jerald Kief, MD  neomycin-bacitracin-polymyxin (NEOSPORIN) ointment Apply topically 2 (two) times daily. Patient taking differently: Apply 1 application topically 2 (two) times daily.  09/26/20   Leatha Gilding, MD  predniSONE (DELTASONE) 10 MG tablet Take 4 tablets (40 mg total) by mouth daily with breakfast for 2 days, THEN 2 tablets (20 mg total) daily with breakfast for 2 days, THEN 1 tablet (10 mg total) daily with breakfast for 2 days, THEN 0.5 tablets (5 mg total) daily with breakfast for 2 days. 10/18/20 10/26/20  Jerald Kief, MD  senna-docusate (SENOKOT-S) 8.6-50 MG tablet Take 1 tablet by mouth at bedtime as needed for mild constipation. 10/07/20   Marguerita Merles Latif, DO  spironolactone (ALDACTONE) 25 MG tablet Take 0.5 tablets (12.5 mg total) by mouth daily. 10/19/20 11/18/20  Jerald Kief, MD  torsemide (DEMADEX) 20 MG tablet Take 20 mg by mouth 2 (two) times daily.    [provider]  traZODone (DESYREL) 50 MG tablet Take 50 mg by mouth at bedtime.    [provider]  umeclidinium bromide (INCRUSE ELLIPTA) 62.5 MCG/INH AEPB Inhale 1 puff into the lungs daily.    [provider]    Allergies    Ibuprofen and Acetaminophen  Review of Systems   Review of Systems  Constitutional: Positive for activity change.  Respiratory: Positive for cough and shortness of  breath.   Cardiovascular: Negative for chest pain.  Gastrointestinal: Negative for nausea and vomiting.  Hematological: Does not bruise/bleed easily.  All other systems reviewed and are negative.   Physical Exam Updated Vital Signs BP (!) 160/131 (BP Location: Left Arm)   Pulse (!) 102   Temp (!) 97.5 F (36.4 C) (Oral)   Resp (!) 30   SpO2 100%   Physical Exam Vitals and nursing note reviewed.  Constitutional:      Appearance: He is well-developed.  HENT:     Head: Atraumatic.  Cardiovascular:     Rate and Rhythm: Normal rate.  Pulmonary:     Effort: Respiratory distress present.     Breath sounds: Wheezing present.     Comments: Diminished breath sounds, rales at the base, mild wheezing Musculoskeletal:  Cervical back: Neck supple.     Right lower leg: Edema present.     Left lower leg: Edema present.     Comments: Bilateral trace pitting edema in the lower extremity  Skin:    General: Skin is warm.  Neurological:     Mental Status: He is alert and oriented to person, place, and time.     ED Results / Procedures / Treatments   Labs (all labs ordered are listed, but only abnormal results are displayed) Labs Reviewed  BASIC METABOLIC PANEL - Abnormal; Notable for the following components:      Result Value   Potassium 3.3 (*)    Calcium 7.8 (*)    All other components within normal limits  CBC - Abnormal; Notable for the following components:   RBC 3.75 (*)    Hemoglobin 12.4 (*)    HCT 38.5 (*)    MCV 102.7 (*)    RDW 15.9 (*)    All other components within normal limits  BRAIN NATRIURETIC PEPTIDE - Abnormal; Notable for the following components:   B Natriuretic Peptide 1,075.9 (*)    All other components within normal limits  RESP PANEL BY RT-PCR (FLU A&B, COVID) ARPGX2  BLOOD GAS, ARTERIAL  RAPID URINE DRUG SCREEN, HOSP PERFORMED    EKG EKG Interpretation  Date/Time:  Friday October 26 2020 07:39:28 EST Ventricular Rate:  101 PR Interval:     QRS Duration: 95 QT Interval:  407 QTC Calculation: 528 R Axis:   14 Text Interpretation: Sinus tachycardia Biatrial enlargement Left ventricular hypertrophy Prolonged QT interval no new changes Confirmed by Derwood Kaplan [phone]) on 10/26/2020 8:51:02 AM   Radiology DG Chest Port 1 View  Result Date: 10/26/2020 CLINICAL DATA:  "Dib." Additional history obtained from electronic MEDICAL RECORD NUMBERShortness of breath for 3 days, worsening this morning, history of CHF and COPD, patient reports rib and lower back pain. EXAM: PORTABLE CHEST 1 VIEW COMPARISON:  Prior chest radiographs 10/14/2020. FINDINGS: Unchanged cardiomegaly. The patient's chin partially obscures the medial lung apices. Subtle interstitial opacities within the right mid to lower lung field. The left lung is clear. No evidence of pleural effusion or pneumothorax. No acute bony abnormality identified. IMPRESSION: The patient's chin partially obscures the medial lung apices Unchanged cardiomegaly. Subtle interstitial opacities within the right mid to lower lung field. Primary considerations include incomplete atelectasis, asymmetric edema or atypical/viral infection. Electronically Signed   By: Jackey Loge DO   On: 10/26/2020 08:25    Procedures .Critical Care Performed by: Derwood Kaplan, MD Authorized by: Derwood Kaplan, MD   Critical care provider statement:    Critical care time (minutes):  80   Critical care was necessary to treat or prevent imminent or life-threatening deterioration of the following conditions:  Respiratory failure and cardiac failure   Critical care was time spent personally by me on the following activities:  Discussions with consultants, evaluation of patient's response to treatment, examination of patient, ordering and performing treatments and interventions, ordering and review of laboratory studies, ordering and review of radiographic studies, pulse oximetry, re-evaluation of patient's condition,  obtaining history from patient or surrogate and review of old charts   (including critical care time)  Medications Ordered in ED Medications  furosemide (LASIX) injection 40 mg (40 mg Intravenous Refused 10/26/20 0907)  midazolam (VERSED) injection 1 mg (has no administration in time range)  albuterol (PROVENTIL,VENTOLIN) solution continuous neb (10 mg/hr Nebulization Given 10/26/20 0925)  albuterol (VENTOLIN HFA) 108 (90 Base) MCG/ACT  inhaler 4 puff (4 puffs Inhalation Given 10/26/20 0818)  ipratropium (ATROVENT) nebulizer solution 0.5 mg (0.5 mg Nebulization Given 10/26/20 0925)  doxycycline (VIBRA-TABS) tablet 100 mg (100 mg Oral Given 10/26/20 0907)  ipratropium-albuterol (DUONEB) 0.5-2.5 (3) MG/3ML nebulizer solution (  Given 10/26/20 0953)  midazolam (VERSED) 2 MG/2ML injection (1 mg  Given 10/26/20 1025)    ED Course  I have reviewed the triage vital signs and the nursing notes.  Pertinent labs & imaging results that were available during my care of the patient were reviewed by me and considered in my medical decision making (see chart for details).  Clinical Course as of Oct 27 1011  Fri Oct 26, 2020  1000 I was advised to reassess the patient.  He was upset about the care he had received.  I was talking with patient about his experience when he suddenly became short of breath.  Patient started complaining that he could not breathe.  He got out of his bed, urinated on himself and became diaphoretic.  I was able to calm the patient down.  We had him tripod.  Requested RT to come and put patient on BiPAP.  RT did a fantastic job of reassuring patient and working with him to put him on BiPAP.  1 mg of Versed ordered.  Patient is feeling good tidal volume.  We will need to continue monitoring his respiratory status and also ensure that he remains calm.   [AN]  1011 ABG for 1030 and another 1 mg Versed for 1030 ordered. PT TOLERATING BIPAP OK.  Hospitalist will admit.   [AN]     Clinical Course User Index [AN] Derwood Kaplan, MD   MDM Rules/Calculators/A&P                          [name] was evaluated in Emergency Department on 10/26/2020 for the symptoms described in the history of present illness. He was evaluated in the context of the global COVID-19 pandemic, which necessitated consideration that the patient might be at risk for infection with the SARS-CoV-2 virus that causes COVID-19. Institutional protocols and algorithms that pertain to the evaluation of patients at risk for COVID-19 are in a state of rapid change based on information released by regulatory bodies including the CDC and federal and state organizations. These policies and algorithms were followed during the patient's care in the ED.   54 year old comes in a chief complaint of shortness of breath. Patient was noted to be hypoxic per EMS.  He is on nonrebreather oxygen and still noted to be tachypneic and in mild respiratory distress.  He has history of advanced CHF, COPD, substance use.  Based on exam it appears to be a mixed picture of likely acute CHF exacerbation and also COPD exacerbation.  Chest x-ray has been reviewed by me.  It appears that there is mild interstitial edema and no pleural effusion.  No focal consolidation.  It does not appear that clinically patient has pneumonia.  COVID-19 in the differential as well.  PCR test sent.  Patient will need admission to the hospital.  Final Clinical Impression(s) / ED Diagnoses Final diagnoses:  Acute respiratory failure with hypoxia (HCC)  COPD with acute bronchitis (HCC)  Acute on chronic systolic congestive heart failure [facility])    Rx / DC Orders ED Discharge Orders    None       Derwood Kaplan, MD 10/26/20 8527    Derwood Kaplan, MD 10/26/20 1012

## 2020-10-26 NOTE — Progress Notes (Signed)
Patient refusing to wear bipap and has taken off mutiple times since arriving to floor at 1140.  Patient refusing PO meds ordered, requesting [place] sandwich and 2 ice creams.

## 2020-10-26 NOTE — ED Notes (Signed)
Spoke with patient and attempted to medicate patient. Patient continues to refuse medication. MD made aware.

## 2020-10-26 NOTE — Progress Notes (Signed)
Patient has taken bipap off again

## 2020-10-26 NOTE — ED Notes (Signed)
MD at bedside. 

## 2020-10-26 NOTE — ED Triage Notes (Signed)
Bib ems SOB x 3 days, worse this morning. Ems reports room air saturation of 79%. Given albuterol + atrovent, 125 solumedrol, 2g mag en route. 97% on non-rebreather. Hx of CHF and COPD. Pt c/o rib and lower backpain

## 2020-10-26 NOTE — Progress Notes (Signed)
Patient has taken bipap off once again.  Patient told this nurse that "you told the doctor some crazy shit that I was not leaving my bipap on".  This nurse informed patient that he was not leaving his bipap on and has removed it numerous times even after the doctor has been in to speak with him about the importance of leaving it on.  Patient informed this nurse that he "wants nothing from this nurse", refused lasix and said, "you can get out of my room".  At that time he called the secretary and asked to speak to the charge nurse.  MD and charge nurse notified.

## 2020-10-26 NOTE — Consult Note (Signed)
[name]   Reason for [name]: Schizoaffective disorder, prolonged QT Referring Physician: Internal medicine Patient Identification: [name] MRN:  [id number] Principal Diagnosis: Acute respiratory failure with hypoxia [facility]) Diagnosis:  Principal Problem:   Acute respiratory failure with hypoxia (HCC) Active Problems:   Schizoaffective disorder, bipolar type (HCC)   Essential hypertension   Cocaine abuse with cocaine-induced mood disorder (HCC)   Homelessness   QT prolongation   Acute on chronic combined systolic and diastolic CHF (congestive heart failure) (HCC)   COPD with acute exacerbation (HCC)   Total Time spent with patient: 15 minutes  Subjective:   [name] is a 54 y.o. male was seen and evaluated face-to-face.  He is awake, alert and oriented. patient presents irritable, flat and guarded.  [name] reported " I do not need psychiatry and you can leave my room."  NP attempted to follow-up with medication questions, [name] reported " I am on the phone with Social Security and I did not ask to see you."  Denied any reports of suicidal or homicidal ideations.  Patient then dismissed this nurse practitioner.  Patient declined further assessment at this time.  NP spoke to RN who stated patient has been refusing care since his admission.  Stated patient has only been receptive to pain medication.  Stated that patient has been refusing medications and is unwilling to wear CPAP.  RN reported patient has been argumentative.  Case staffed with attending psychiatrist [name].  Continue current medication regimen.  Depakote 500 mg p.o. twice daily and gabapentin 200 mg p.o. twice daily.  Consider obtaining Depakote level.  Support,  encouragement and reassurance was provided  HPI:  Per admission assessment note: 54 year old male with history of diabetes, CHF, COPD, schizoaffective disorder comes in a chief complaint of shortness of breath.According to the patient  has been feeling short of breath for the last couple of days.  This morning his symptoms acutely worsened.  Per EMS patient's O2 sats were 79%.  Patient does not have history of oxygen use at home.  Patient thinks that his symptoms are because of congestive heart failure.  His symptoms are worse with ambulation and with him laying flat.  Patient denies any substance use.  He has no chest pain.  Positive cough, mostly nonproductive.  No sick contacts.  Past Psychiatric History:   Risk to Self:   Risk to Others:   Prior Inpatient Therapy:   Prior Outpatient Therapy:    Past Medical History:  Past Medical History:  Diagnosis Date  . Asthma   . Bipolar 1 disorder (HCC)   . CHF (congestive heart failure) (HCC)   . COPD (chronic obstructive pulmonary disease) (HCC)   . Depression   . Diabetes mellitus without complication (HCC)   . GERD (gastroesophageal reflux disease)   . Homelessness   . Hypertension   . QT prolongation   . Schizoaffective disorder (HCC)    History reviewed. No pertinent surgical history. Family History:  Family History  Problem Relation Age of Onset  . Drug abuse Mother   . Suicidality Mother    Family Psychiatric  History:  Social History:  Social History   Substance and Sexual Activity  Alcohol Use Yes   Comment: infrequently     Social History   Substance and Sexual Activity  Drug Use Yes  . Types: Cocaine, Marijuana    Social History   Socioeconomic History  . Marital status: Single    Spouse name: Not on file  .  Number of children: Not on file  . Years of education: Not on file  . Highest education level: Not on file  Occupational History  . Not on file  Tobacco Use  . Smoking status: Current Every Day Smoker    Years: 30.00    Types: Cigarettes  . Smokeless tobacco: Never Used  Vaping Use  . Vaping Use: Never used  Substance and Sexual Activity  . Alcohol use: Yes    Comment: infrequently  . Drug use: Yes    Types: Cocaine,  Marijuana  . Sexual activity: Yes    Birth control/protection: None  Other Topics Concern  . Not on file  Social History Narrative  . Not on file   Social Determinants of Health   Financial Resource Strain:   . Difficulty of Paying Living Expenses: Not on file  Food Insecurity:   . Worried About Programme researcher, broadcasting/film/video in the Last Year: Not on file  . Ran Out of Food in the Last Year: Not on file  Transportation Needs:   . Lack of Transportation (Medical): Not on file  . Lack of Transportation (Non-Medical): Not on file  Physical Activity:   . Days of Exercise per Week: Not on file  . Minutes of Exercise per Session: Not on file  Stress:   . Feeling of Stress : Not on file  Social Connections:   . Frequency of Communication with Friends and Family: Not on file  . Frequency of Social Gatherings with Friends and Family: Not on file  . Attends Religious Services: Not on file  . Active Member of Clubs or Organizations: Not on file  . Attends Banker Meetings: Not on file  . Marital Status: Not on file   Additional Social History:    Allergies:   Allergies  Allergen Reactions  . Ibuprofen Anaphylaxis  . Acetaminophen Hives, Nausea And Vomiting and Rash    Pt overdosed on bottle of APAP 05/06/16     Labs:  Results for orders placed or performed during the hospital encounter of 10/26/20 (from the past 48 hour(s))  Basic metabolic panel     Status: Abnormal   Collection Time: 10/26/20  7:32 AM  Result Value Ref Range   Sodium 139 135 - 145 mmol/L   Potassium 3.3 (L) 3.5 - 5.1 mmol/L   Chloride 108 98 - 111 mmol/L   CO2 22 22 - 32 mmol/L   Glucose, Bld 90 70 - 99 mg/dL    Comment: Glucose reference range applies only to samples taken after fasting for at least 8 hours.   BUN 16 6 - 20 mg/dL   Creatinine, Ser 3.14 0.61 - 1.24 mg/dL   Calcium 7.8 (L) 8.9 - 10.3 mg/dL   GFR, Estimated >97 >02 mL/min    Comment: (NOTE) Calculated using the CKD-EPI Creatinine  Equation (2021)    Anion gap 9 5 - 15    Comment: Performed at [facility], [address], [place], [name] [address]  CBC     Status: Abnormal   Collection Time: 10/26/20  7:32 AM  Result Value Ref Range   WBC 7.6 4.0 - 10.5 K/uL   RBC 3.75 (L) 4.22 - 5.81 MIL/uL   Hemoglobin 12.4 (L) 13.0 - 17.0 g/dL   HCT 88.5 (L) 39 - 52 %   MCV 102.7 (H) 80.0 - 100.0 fL   MCH 33.1 26.0 - 34.0 pg   MCHC 32.2 30.0 - 36.0 g/dL   RDW  15.9 (H) 11.5 - 15.5 %   Platelets 235 150 - 400 K/uL   nRBC 0.0 0.0 - 0.2 %    Comment: Performed at [facility], [address] [place], [name] [address]  Brain natriuretic peptide     Status: Abnormal   Collection Time: 10/26/20  7:32 AM  Result Value Ref Range   B Natriuretic Peptide 1,075.9 (H) 0.0 - 100.0 pg/mL    Comment: Performed at [facility], [address], [place], [name] [address]  Magnesium     Status: Abnormal   Collection Time: 10/26/20  7:32 AM  Result Value Ref Range   Magnesium 2.5 (H) 1.7 - 2.4 mg/dL    Comment: Performed at [facility], [address] [place], [name] [address]  Troponin I (High Sensitivity)     Status: Abnormal   Collection Time: 10/26/20  7:32 AM  Result Value Ref Range   Troponin I (High Sensitivity) 36 (H) <18 ng/L    Comment: (NOTE) Elevated high sensitivity troponin I (hsTnI) values and significant  changes across serial measurements may suggest ACS but many other  chronic and acute conditions are known to elevate hsTnI results.  Refer to the "Links" section for chest pain algorithms and additional  guidance. Performed at [facility], [address], [place], [name] [address]   Resp Panel by RT-PCR (Flu A&B, Covid) Nasopharyngeal Swab     Status: None   Collection Time: 10/26/20  7:49 AM   Specimen: Nasopharyngeal Swab; Nasopharyngeal(NP) swabs in vial transport medium  Result Value Ref Range   SARS  Coronavirus 2 by RT PCR NEGATIVE NEGATIVE    Comment: (NOTE) SARS-CoV-2 target nucleic acids are NOT DETECTED.  The SARS-CoV-2 RNA is generally detectable in upper respiratory specimens during the acute phase of infection. The lowest concentration of SARS-CoV-2 viral copies this assay can detect is 138 copies/mL. A negative result does not preclude SARS-Cov-2 infection and should not be used as the sole basis for treatment or other patient management decisions. A negative result may occur with  improper specimen collection/handling, submission of specimen other than nasopharyngeal swab, presence of viral mutation(s) within the areas targeted by this assay, and inadequate number of viral copies(<138 copies/mL). A negative result must be combined with clinical observations, patient history, and epidemiological information. The expected result is Negative.  Fact Sheet for Patients:  BloggerCourse.com  Fact Sheet for Healthcare Providers:  SeriousBroker.it  This test is no t yet approved or cleared by the [place] FDA and  has been authorized for detection and/or diagnosis of SARS-CoV-2 by FDA under an Emergency Use Authorization (EUA). This EUA will remain  in effect (meaning this test can be used) for the duration of the COVID-19 declaration under Section 564(b)(1) of the Act, 21 U.S.C.section 360bbb-3(b)(1), unless the authorization is terminated  or revoked sooner.       Influenza A by PCR NEGATIVE NEGATIVE   Influenza B by PCR NEGATIVE NEGATIVE    Comment: (NOTE) The Xpert Xpress SARS-CoV-2/FLU/RSV plus assay is intended as an aid in the diagnosis of influenza from Nasopharyngeal swab specimens and should not be used as a sole basis for treatment. Nasal washings and aspirates are unacceptable for Xpert Xpress SARS-CoV-2/FLU/RSV testing.  Fact Sheet for Patients: BloggerCourse.com  Fact Sheet  for Healthcare Providers: SeriousBroker.it  This test is not yet approved or cleared by the [place] FDA and has been authorized for detection and/or diagnosis of SARS-CoV-2 by  FDA under an Emergency Use Authorization (EUA). This EUA will remain in effect (meaning this test can be used) for the duration of the COVID-19 declaration under Section 564(b)(1) of the Act, 21 U.S.C. section 360bbb-3(b)(1), unless the authorization is terminated or revoked.  Performed at [facility], [address] [place], [name] [address]     Current Facility-Administered Medications  Medication Dose Route Frequency Provider Last Rate Last Admin  . albuterol (PROVENTIL) (2.5 MG/3ML) 0.083% nebulizer solution 2.5 mg  2.5 mg Nebulization Q2H PRN Jae Dire, MD      . clopidogrel (PLAVIX) tablet 75 mg  75 mg Oral Daily Jae Dire, MD      . divalproex (DEPAKOTE ER) 24 hr tablet 500 mg  500 mg Oral BID Jae Dire, MD      . enoxaparin (LOVENOX) injection 40 mg  40 mg Subcutaneous Q24H Whitney Post E, MD      . fluticasone furoate-vilanterol (BREO ELLIPTA) 100-25 MCG/INH 1 puff  1 puff Inhalation Daily Jae Dire, MD      . furosemide (LASIX) injection 40 mg  40 mg Intravenous BID Jae Dire, MD   40 mg at 10/26/20 1405  . gabapentin (NEURONTIN) capsule 200 mg  200 mg Oral BID Jae Dire, MD   200 mg at 10/26/20 1601  . hydrALAZINE (APRESOLINE) tablet 50 mg  50 mg Oral Q8H Jae Dire, MD   50 mg at 10/26/20 1601  . ipratropium-albuterol (DUONEB) 0.5-2.5 (3) MG/3ML nebulizer solution 3 mL  3 mL Nebulization Q6H Jae Dire, MD   3 mL at 10/26/20 1420  . isosorbide mononitrate (IMDUR) 24 hr tablet 30 mg  30 mg Oral Daily Jae Dire, MD   30 mg at 10/26/20 1601  . midazolam (VERSED) injection 1 mg  1 mg Intravenous Once Jae Dire, MD      . oxyCODONE (Oxy IR/ROXICODONE) immediate release tablet 5 mg  5 mg Oral Q6H PRN Jae Dire, MD   5 mg at 10/26/20 1419  . [START ON 10/27/2020] predniSONE (DELTASONE) tablet 40 mg  40 mg Oral Q breakfast Jae Dire, MD        Musculoskeletal: Strength & Muscle Tone: within normal limits Gait & Station: Observed resting in bed Patient leans: N/A  Psychiatric Specialty Exam: Physical Exam Vitals reviewed.  Psychiatric:        Mood and Affect: Mood normal.     Review of Systems  Psychiatric/Behavioral: Positive for agitation.  All other systems reviewed and are negative.   Blood pressure (!) 162/88, pulse 84, temperature (!) 97.5 F (36.4 C), temperature source Oral, resp. rate (!) 40, SpO2 100 %.There is no height or weight on file to calculate BMI.  General Appearance: Disheveled  Eye Contact:  Good  Speech:  Clear and Coherent  Volume:  Normal  Mood:  Irritable  Affect:  Congruent  Thought Process:  Coherent  Orientation:  NA  Thought Content:  Logical  Suicidal Thoughts:  Unable to assess patient asked the provider to leave the room  Homicidal Thoughts:  n/a  Memory:  NA  Judgement:  NA  Insight:  Fair  Psychomotor Activity:  NA  Concentration:  Concentration: Fair  Recall:  [employer] of Knowledge:  Fair  Language:  Fair  Akathisia:  No  Handed:  Right  AIMS (if indicated):     Assets:  Manufacturing systems engineer Social Support  ADL's:  Intact  Cognition:  WNL  Sleep:        Treatment Plan Summary: Daily contact with patient to assess and evaluate symptoms and progress in treatment and Medication management   -Continue Depakote 500 mg p.o. twice daily -Continue gabapentin 200 mg p.o. twice daily -Valproic acid level   (Pending results)-was reported patient has been refusing medications    Disposition: No evidence of imminent risk to self or others at present.   Patient does not meet criteria for psychiatric inpatient admission. Supportive therapy provided about ongoing stressors. Refer to IOP. Discussed crisis plan, support from  social network, calling 911, coming to the Emergency Department, and calling Suicide Hotline.   -Thank you for contacting behavioral health services  Oneta Rack, NP 10/26/2020 4:18 PM

## 2020-10-26 NOTE — Progress Notes (Signed)
MAR locked- Incruse will start at approximately 0800.

## 2020-10-26 NOTE — Plan of Care (Signed)

## 2020-10-26 NOTE — Progress Notes (Signed)
Patient has taken off bipap once again to use phone "to call a friend".  Reinforced to patient the importance of wearing bipap.  Patient argumentative stating "fuck that, I've never had to wear this before" and continues to be non compliant.

## 2020-10-26 NOTE — ED Notes (Signed)
Pt states that I am an asshole and wants a new nurse. Refusing medication from me. Charge nurse made made aware.

## 2020-10-26 NOTE — Progress Notes (Signed)
Patient asked to see the Charge Nurse (as per the secretary). This Clinical research associate is the Location manager and so went to the patient's room.  The patient stated that he wanted to see the nurse, who came to the room too, and said if he didn't get respect he would "cuss out everybody". Then patient stated that he would " raise hell but not yet". The Charge Nurse stated that she would pass on the message to the staff.

## 2020-10-26 NOTE — H&P (Signed)
History and Physical        Hospital Admission Note Date: 10/26/2020  Patient name: [name] Medical record number: [id number] Date of birth: [birth year] Age: 54 y.o. Gender: male  PCP: Patient, No Pcp Per    Chief Complaint    Chief Complaint  Patient presents with  . Respiratory Distress      HPI:   This is a 54 year old male with past medical history of diabetes, HFrEF, COPD, schizoaffective disorder, prolonged QT, polysubstance abuse, tobacco use, recent hospitalization from 11/15-11/18 for COPD exacerbation and heart failure exacerbation who presents to the ED via EMS for worsening shortness of breath x3 days which acutely worsened this morning.  Upon arrival, EMS reported air saturation to be 79% and he was given albuterol and Atrovent, Solu-Medrol 125 mg and 2 g magnesium and route.  Placed on NRB.  Symptoms worse with ambulation and lying flat.  No chest pain.  States he has not used tobacco or cocaine in the past week.  Has a nonproductive cough.  Says that he initially feels a bit better after leaving the hospital but has been having worsening symptoms since.  Says he is feeling a bit better now compared to this a.m.  Says that he has been adhering to a low low-sodium diet.   ED Course: Afebrile, tachycardic, tachypneic, initially on 15 LPM via NRB but now on BiPAP. Notable Labs: Sodium 139, K3.3, BUN 16, creatinine 1.13, BNP 1075 (baseline is about 300-400), COVID-19 negative. Notable Imaging: CXR with interstitial opacities in the right mid and lower lung field concerning for either atelectasis, asymmetric edema or atypical/viral infection. Patient received albuterol continuously, doxycycline, Lasix was ordered but he refused, given Atrovent and steroids in route as above.  Per signout from the ED MD, the patient was initially tolerating 15 LPM but became acutely  aggravated/angry at his nurse and then began tripoding and was placed on BiPAP and received Versed x1.   Vitals:   10/26/20 1012 10/26/20 1043  BP: (!) 160/131 (!) 172/125  Pulse: (!) 108 92  Resp: (!) 36 (!) 28  Temp:    SpO2: 100% 100%     Review of Systems:  Review of Systems  All other systems reviewed and are negative.   Medical/Social/Family History   Past Medical History: Past Medical History:  Diagnosis Date  . Asthma   . Bipolar 1 disorder (HCC)   . CHF (congestive heart failure) (HCC)   . COPD (chronic obstructive pulmonary disease) (HCC)   . Depression   . Diabetes mellitus without complication (HCC)   . GERD (gastroesophageal reflux disease)   . Homelessness   . Hypertension   . QT prolongation   . Schizoaffective disorder (HCC)     History reviewed. No pertinent surgical history.  Medications: Prior to Admission medications   Medication Sig Start Date End Date Taking? Authorizing Provider  carvedilol (COREG) 3.125 MG tablet Take 3.125 mg by mouth 2 (two) times daily with a meal.   Yes [provider]  cloNIDine (CATAPRES) 0.1 MG tablet Take 0.1 mg by mouth 3 (three) times daily.   Yes [provider]  albuterol (VENTOLIN HFA) 108 (90 Base) MCG/ACT inhaler Inhale 2 puffs into the  lungs every 6 (six) hours as needed for wheezing or shortness of breath. 09/26/20   Leatha Gilding, MD  atorvastatin (LIPITOR) 20 MG tablet Take 1 tablet (20 mg total) by mouth daily. 10/07/20   Marguerita Merles Latif, DO  clopidogrel (PLAVIX) 75 MG tablet Take 1 tablet (75 mg total) by mouth daily. 10/08/20   Marguerita Merles Latif, DO  divalproex (DEPAKOTE ER) 500 MG 24 hr tablet Take 500 mg by mouth in the morning and at bedtime.    [provider]  doxycycline (VIBRA-TABS) 100 MG tablet Take 100 mg by mouth 2 (two) times daily.    [provider]  furosemide (LASIX) 40 MG tablet Take 1 tablet (40 mg total) by mouth daily. 10/07/20 11/06/20  Marguerita Merles Latif, DO  gabapentin (NEURONTIN) 100 MG capsule Take 2 capsules (200 mg total) by mouth 2 (two) times daily. 10/07/20   Marguerita Merles Latif, DO  hydrALAZINE (APRESOLINE) 50 MG tablet Take 1 tablet (50 mg total) by mouth every 8 (eight) hours. Patient not taking: Reported on 10/15/2020 10/07/20   Marguerita Merles Latif, DO  isosorbide mononitrate (IMDUR) 30 MG 24 hr tablet Take 1 tablet (30 mg total) by mouth daily. 10/07/20 11/06/20  Marguerita Merles Latif, DO  lisinopril (ZESTRIL) 20 MG tablet Take 20 mg by mouth in the morning and at bedtime.    [provider]  losartan (COZAAR) 50 MG tablet Take 1 tablet (50 mg total) by mouth daily. 10/19/20 11/18/20  Jerald Kief, MD  neomycin-bacitracin-polymyxin (NEOSPORIN) ointment Apply topically 2 (two) times daily. Patient taking differently: Apply 1 application topically 2 (two) times daily.  09/26/20   Leatha Gilding, MD  predniSONE (DELTASONE) 10 MG tablet Take 4 tablets (40 mg total) by mouth daily with breakfast for 2 days, THEN 2 tablets (20 mg total) daily with breakfast for 2 days, THEN 1 tablet (10 mg total) daily with breakfast for 2 days, THEN 0.5 tablets (5 mg total) daily with breakfast for 2 days. 10/18/20 10/26/20  Jerald Kief, MD  senna-docusate (SENOKOT-S) 8.6-50 MG tablet Take 1 tablet by mouth at bedtime as needed for mild constipation. 10/07/20   Marguerita Merles Latif, DO  spironolactone (ALDACTONE) 25 MG tablet Take 0.5 tablets (12.5 mg total) by mouth daily. 10/19/20 11/18/20  Jerald Kief, MD  torsemide (DEMADEX) 20 MG tablet Take 20 mg by mouth 2 (two) times daily.    [provider]  traZODone (DESYREL) 50 MG tablet Take 50 mg by mouth at bedtime.    [provider]  umeclidinium bromide (INCRUSE ELLIPTA) 62.5 MCG/INH AEPB Inhale 1 puff into the lungs daily.    [provider]    Allergies:   Allergies  Allergen Reactions  . Ibuprofen Anaphylaxis  . Acetaminophen Hives, Nausea And  Vomiting and Rash    Pt overdosed on bottle of APAP 05/06/16     Social History:  reports that he has been smoking cigarettes. He has smoked for the past 30.00 years. He has never used smokeless tobacco. He reports current alcohol use. He reports current drug use. Drugs: Cocaine and Marijuana.  Family History: Family History  Problem Relation Age of Onset  . Drug abuse Mother   . Suicidality Mother      Objective   Physical Exam: Blood pressure (!) 172/125, pulse 92, temperature (!) 97.5 F (36.4 C), temperature source Oral, resp. rate (!) 28, SpO2 100 %.  Physical Exam Vitals and nursing note reviewed.  HENT:  Head: Normocephalic.  Eyes:     Conjunctiva/sclera: Conjunctivae normal.  Cardiovascular:     Rate and Rhythm: Regular rhythm. Tachycardia present.  Pulmonary:     Effort: Respiratory distress present.     Breath sounds: Wheezing present.     Comments: Difficult to auscultate due to BiPAP Abdominal:     General: Abdomen is flat.  Musculoskeletal:        General: No swelling or tenderness.  Skin:    Coloration: Skin is not jaundiced.  Neurological:     Mental Status: He is alert. Mental status is at baseline.  Psychiatric:        Mood and Affect: Affect is angry.     LABS on Admission: I have personally reviewed all the labs and imaging below    Basic Metabolic Panel: Recent Labs  Lab 10/26/20 0732  NA 139  K 3.3*  CL 108  CO2 22  GLUCOSE 90  BUN 16  CREATININE 1.13  CALCIUM 7.8*  MG 2.5*   Liver Function Tests: No results for input(s): AST, ALT, ALKPHOS, BILITOT, PROT, ALBUMIN in the last 168 hours. No results for input(s): LIPASE, AMYLASE in the last 168 hours. No results for input(s): AMMONIA in the last 168 hours. CBC: Recent Labs  Lab 10/26/20 0732  WBC 7.6  HGB 12.4*  HCT 38.5*  MCV 102.7*  PLT 235   Cardiac Enzymes: No results for input(s): CKTOTAL, CKMB, CKMBINDEX, TROPONINI in the last 168 hours. BNP: Invalid input(s):  POCBNP CBG: No results for input(s): GLUCAP in the last 168 hours.  Radiological Exams on Admission:  DG Chest Port 1 View  Result Date: 10/26/2020 CLINICAL DATA:  "Dib." Additional history obtained from electronic MEDICAL RECORD NUMBERShortness of breath for 3 days, worsening this morning, history of CHF and COPD, patient reports rib and lower back pain. EXAM: PORTABLE CHEST 1 VIEW COMPARISON:  Prior chest radiographs 10/14/2020. FINDINGS: Unchanged cardiomegaly. The patient's chin partially obscures the medial lung apices. Subtle interstitial opacities within the right mid to lower lung field. The left lung is clear. No evidence of pleural effusion or pneumothorax. No acute bony abnormality identified. IMPRESSION: The patient's chin partially obscures the medial lung apices Unchanged cardiomegaly. Subtle interstitial opacities within the right mid to lower lung field. Primary considerations include incomplete atelectasis, asymmetric edema or atypical/viral infection. Electronically Signed   By: Jackey Loge DO   On: 10/26/2020 08:25      EKG: Sinus tachycardia with prolonged QT   A & P   Principal Problem:   Acute respiratory failure with hypoxia (HCC) Active Problems:   Schizoaffective disorder, bipolar type (HCC)   Essential hypertension   Cocaine abuse with cocaine-induced mood disorder (HCC)   Homelessness   QT prolongation   Acute on chronic combined systolic and diastolic CHF (congestive heart failure) (HCC)   COPD with acute exacerbation (HCC)   1. Acute hypoxic respiratory failure, likely multifactorial: heart failure and COPD exacerbations a. Typically room air at baseline, currently on BiPAP b. Refused Lasix in the ED, would continue to encourage aggressive diuresis c. Continue with steroids and inhalers for COPD exacerbation as below d. Wean off BiPAP to room air as tolerated  2. Acute on chronic systolic and diastolic heart failure exacerbation a. BNP 1000+, troponin  not obtained -will order b. Echo 09/26/2020 EF 40 to 45% with mildly decreased systolic function and LV global hypokinesis with grade 2 diastolic dysfunction and moderately dilated LA. c. Continue with diuresis d. Continue home hydralazine  and Imdur e. Holding home Coreg due to acute heart failure exacerbation and suspected cocaine use f. Holding spironolactone for now g. Monitor daily weights and intake/output  3. Acute COPD exacerbation a. Continue home Incruse Ellipta b. Continue steroids c. Start Breo Ellipta, duo nebs and as needed albuterol  4. Prolonged QT a. Hold QT prolonging agents as able b. Ensure potassium and magnesium are at goal c. Telemetry  5. Schizoaffective disorder a. Continue home Depakote b. Holding trazodone for now due to QT c. Psych consult to assist with medications  6. Hypertension a. Holding home clonidine, carvedilol, lisinopril and spironolactone b. Continue home hydralazine and Imdur  7. Hyperlipidemia a. Continue statin  8. Polysubstance abuse a. UDS  9. Hypokalemia a. Replete PO  10. Prediabetes a. monitor   DVT prophylaxis: Lovenox   Code Status: Full Code  Diet: Low-sodium diet Family Communication: Admission, patients condition and plan of care including tests being ordered have been discussed with the patient who indicates understanding and agrees with the plan and Code Status.  Disposition Plan: The appropriate patient status for this patient is INPATIENT. Inpatient status is judged to be reasonable and necessary in order to provide the required intensity of service to ensure the patient's safety. The patient's presenting symptoms, physical exam findings, and initial radiographic and laboratory data in the context of their chronic comorbidities is felt to place them at high risk for further clinical deterioration. Furthermore, it is not anticipated that the patient will be medically stable for discharge from the hospital within 2  midnights of admission. The following factors support the patient status of inpatient.   " The patient's presenting symptoms include respiratory failure. " The worrisome physical exam findings include respiratory distress. " The initial radiographic and laboratory data are worrisome because of elevated BNP. " The chronic co-morbidities include heart failure, COPD, polysubstance abuse, homelessness.   * I certify that at the point of admission it is my clinical judgment that the patient will require inpatient hospital care spanning beyond 2 midnights from the point of admission due to high intensity of service, high risk for further deterioration and high frequency of surveillance required.*   Status is: Inpatient  Remains inpatient appropriate because:Hemodynamically unstable, IV treatments appropriate due to intensity of illness or inability to take PO and Inpatient level of care appropriate due to severity of illness   Dispo: The patient is from: Home              Anticipated d/c is to: Home              Anticipated d/c date is: > 3 days              Patient currently is not medically stable to d/c.         The medical decision making on this patient was of high complexity and the patient is at high risk for clinical deterioration, therefore this is a level 3 admission.  Consultants  . psych  Procedures  . BiPAP  Time Spent on Admission: 70 minutes    Jae Dire, DO [facility]  10/26/2020, 11:06 AM

## 2020-10-26 NOTE — ED Notes (Signed)
Pt given [place] sandwich, crackers and ice

## 2020-10-26 NOTE — ED Notes (Signed)
Respiratory called and airway cart to bedside per MD request.

## 2020-10-26 NOTE — ED Notes (Signed)
Report called to [phone] RN [place]. RN unsure if unit is able to take patients on bi-pap. Checking with charge RN and will call back.

## 2020-10-26 NOTE — Progress Notes (Addendum)
Patient continues to take bipap off for various reason, to use phone, drink fluids, stating that the mask is too tight, this nurse has attempted to reinforce to patient the importance of wearing bipap.  Patient continues to be non compliant.  MD aware

## 2020-10-27 DIAGNOSIS — F1414 Cocaine abuse with cocaine-induced mood disorder: Secondary | ICD-10-CM | POA: Diagnosis not present

## 2020-10-27 DIAGNOSIS — J9601 Acute respiratory failure with hypoxia: Secondary | ICD-10-CM | POA: Diagnosis not present

## 2020-10-27 DIAGNOSIS — I5043 Acute on chronic combined systolic (congestive) and diastolic (congestive) heart failure: Secondary | ICD-10-CM | POA: Diagnosis not present

## 2020-10-27 DIAGNOSIS — E876 Hypokalemia: Secondary | ICD-10-CM

## 2020-10-27 DIAGNOSIS — J441 Chronic obstructive pulmonary disease with (acute) exacerbation: Secondary | ICD-10-CM | POA: Diagnosis not present

## 2020-10-27 DIAGNOSIS — E785 Hyperlipidemia, unspecified: Secondary | ICD-10-CM

## 2020-10-27 LAB — CBC
HCT: 39 % (ref 39.0–52.0)
Hemoglobin: 12.6 g/dL — ABNORMAL LOW (ref 13.0–17.0)
MCH: 33 pg (ref 26.0–34.0)
MCHC: 32.3 g/dL (ref 30.0–36.0)
MCV: 102.1 fL — ABNORMAL HIGH (ref 80.0–100.0)
Platelets: 248 10*3/uL (ref 150–400)
RBC: 3.82 MIL/uL — ABNORMAL LOW (ref 4.22–5.81)
RDW: 15.9 % — ABNORMAL HIGH (ref 11.5–15.5)
WBC: 15.1 10*3/uL — ABNORMAL HIGH (ref 4.0–10.5)
nRBC: 0 % (ref 0.0–0.2)

## 2020-10-27 LAB — BASIC METABOLIC PANEL
Anion gap: 12 (ref 5–15)
BUN: 23 mg/dL — ABNORMAL HIGH (ref 6–20)
CO2: 23 mmol/L (ref 22–32)
Calcium: 9 mg/dL (ref 8.9–10.3)
Chloride: 104 mmol/L (ref 98–111)
Creatinine, Ser: 1.26 mg/dL — ABNORMAL HIGH (ref 0.61–1.24)
GFR, Estimated: >60 mL/min (ref >60)
Glucose, Bld: 185 mg/dL — ABNORMAL HIGH (ref 70–99)
Potassium: 4.7 mmol/L (ref 3.5–5.1)
Sodium: 139 mmol/L (ref 135–145)

## 2020-10-27 LAB — URINALYSIS, ROUTINE W REFLEX MICROSCOPIC
Bilirubin Urine: NEGATIVE
Glucose, UA: NEGATIVE mg/dL
Hgb urine dipstick: NEGATIVE
Ketones, ur: NEGATIVE mg/dL
Leukocytes,Ua: NEGATIVE
Nitrite: NEGATIVE
Protein, ur: NEGATIVE mg/dL
Specific Gravity, Urine: 1.004 — ABNORMAL LOW (ref 1.005–1.030)
pH: 7 (ref 5.0–8.0)

## 2020-10-27 LAB — GLUCOSE, CAPILLARY
Glucose-Capillary: 182 mg/dL — ABNORMAL HIGH (ref 70–99)
Glucose-Capillary: 160 mg/dL — ABNORMAL HIGH (ref 70–99)
Glucose-Capillary: 375 mg/dL — ABNORMAL HIGH (ref 70–99)

## 2020-10-27 LAB — RAPID URINE DRUG SCREEN, HOSP PERFORMED
Amphetamines: NOT DETECTED
Barbiturates: NOT DETECTED
Benzodiazepines: NOT DETECTED
Cocaine: NOT DETECTED
Opiates: NOT DETECTED
Tetrahydrocannabinol: NOT DETECTED

## 2020-10-27 MED ORDER — LORAZEPAM 0.5 MG PO TABS
0.5000 mg | ORAL_TABLET | Freq: Once | ORAL | Status: AC
  Administered 2020-10-27: 0.5 mg via ORAL
  Filled 2020-10-27: qty 1

## 2020-10-27 MED ORDER — OXYCODONE HCL 5 MG PO TABS
5.0000 mg | ORAL_TABLET | ORAL | Status: DC | PRN
  Administered 2020-10-27 – 2020-10-29 (×9): 5 mg via ORAL
  Filled 2020-10-27 (×10): qty 1

## 2020-10-27 MED ORDER — SENNOSIDES-DOCUSATE SODIUM 8.6-50 MG PO TABS: 1.0000 | ORAL_TABLET | Freq: Every evening | ORAL | Status: DC | PRN

## 2020-10-27 MED ORDER — IPRATROPIUM-ALBUTEROL 0.5-2.5 (3) MG/3ML IN SOLN
3.0000 mL | Freq: Three times a day (TID) | RESPIRATORY_TRACT | Status: DC
  Administered 2020-10-27 – 2020-10-30 (×10): 3 mL via RESPIRATORY_TRACT
  Filled 2020-10-27 (×12): qty 3

## 2020-10-27 MED ORDER — BUDESONIDE 0.5 MG/2ML IN SUSP
0.5000 mg | Freq: Two times a day (BID) | RESPIRATORY_TRACT | Status: DC
  Administered 2020-10-27 – 2020-10-30 (×7): 0.5 mg via RESPIRATORY_TRACT
  Filled 2020-10-27 (×7): qty 2

## 2020-10-27 MED ORDER — PANTOPRAZOLE SODIUM 40 MG PO TBEC
40.0000 mg | DELAYED_RELEASE_TABLET | Freq: Every day | ORAL | Status: DC
  Administered 2020-10-27 – 2020-10-31 (×5): 40 mg via ORAL
  Filled 2020-10-27 (×5): qty 1

## 2020-10-27 MED ORDER — MAGIC MOUTHWASH W/LIDOCAINE
15.0000 mL | Freq: Four times a day (QID) | ORAL | Status: DC
  Administered 2020-10-27 – 2020-10-31 (×8): 15 mL via ORAL
  Filled 2020-10-27 (×17): qty 15

## 2020-10-27 MED ORDER — CHLORHEXIDINE GLUCONATE 0.12 % MT SOLN
15.0000 mL | Freq: Two times a day (BID) | OROMUCOSAL | Status: DC
  Administered 2020-10-27 – 2020-10-31 (×6): 15 mL via OROMUCOSAL
  Filled 2020-10-27 (×6): qty 15

## 2020-10-27 MED ORDER — UMECLIDINIUM BROMIDE 62.5 MCG/INH IN AEPB
1.0000 | INHALATION_SPRAY | Freq: Every day | RESPIRATORY_TRACT | Status: DC
  Filled 2020-10-27: qty 7

## 2020-10-27 MED ORDER — LORATADINE 10 MG PO TABS
10.0000 mg | ORAL_TABLET | Freq: Every day | ORAL | Status: DC
  Administered 2020-10-27 – 2020-10-31 (×5): 10 mg via ORAL
  Filled 2020-10-27 (×5): qty 1

## 2020-10-27 MED ORDER — HYDROXYZINE HCL 25 MG PO TABS
25.0000 mg | ORAL_TABLET | Freq: Three times a day (TID) | ORAL | Status: DC | PRN
  Administered 2020-10-27 – 2020-10-28 (×2): 25 mg via ORAL
  Filled 2020-10-27 (×2): qty 1

## 2020-10-27 MED ORDER — BUDESONIDE 0.5 MG/2ML IN SUSP: 0.5000 mg | Freq: Two times a day (BID) | RESPIRATORY_TRACT | Status: DC

## 2020-10-27 MED ORDER — SENNOSIDES-DOCUSATE SODIUM 8.6-50 MG PO TABS
1.0000 | ORAL_TABLET | Freq: Two times a day (BID) | ORAL | Status: DC
  Filled 2020-10-27: qty 1

## 2020-10-27 MED ORDER — METHYLPREDNISOLONE SODIUM SUCC 125 MG IJ SOLR
80.0000 mg | Freq: Three times a day (TID) | INTRAMUSCULAR | Status: DC
  Administered 2020-10-27 – 2020-10-29 (×5): 80 mg via INTRAVENOUS
  Filled 2020-10-27 (×5): qty 2

## 2020-10-27 MED ORDER — ORAL CARE MOUTH RINSE
15.0000 mL | Freq: Two times a day (BID) | OROMUCOSAL | Status: DC
  Administered 2020-10-27 – 2020-10-31 (×5): 15 mL via OROMUCOSAL

## 2020-10-27 NOTE — Progress Notes (Signed)
Pt requested CBG check this evening. Allena Pietila, Yancey Flemings, RN

## 2020-10-27 NOTE — Progress Notes (Signed)
RT asked patient for an approximate time that he will be ready to wear his CPAP, patient never asked. RT informed RN to called if and when patient is ready for CPAP.

## 2020-10-27 NOTE — Progress Notes (Signed)
Pt called and requested to speak with the "hospital supervisor".  AC was called to speak with pt.  Pt did inform this RN that he was missing a "black ziplock" bag and he knew hospital staff had misplaced or thrown the bag out.  This RN was unable to locate a "black ziplock" bag in the room. Pt made request to [facility] to inform the staff to leave his belongings alone. This RN was made aware of pt's request. Theta Leaf, Yancey Flemings, RN

## 2020-10-27 NOTE — Progress Notes (Signed)
PROGRESS NOTE    [name]  RZN:[id number] DOB: Dec 11, 1965 DOA: 10/26/2020 PCP: Patient, No Pcp Per    Chief Complaint  Patient presents with  . Respiratory Distress    Brief Narrative:  HPI per Dr. Dairl Ponder This is a 54 year old male with past medical history of diabetes, HFrEF, COPD, schizoaffective disorder, prolonged QT, polysubstance abuse, tobacco use, recent hospitalization from 11/15-11/18 for COPD exacerbation and heart failure exacerbation who presents to the ED via EMS for worsening shortness of breath x3 days which acutely worsened this morning.  Upon arrival, EMS reported air saturation to be 79% and he was given albuterol and Atrovent, Solu-Medrol 125 mg and 2 g magnesium and route.  Placed on NRB.  Symptoms worse with ambulation and lying flat.  No chest pain.  States he has not used tobacco or cocaine in the past week.  Has a nonproductive cough.  Says that he initially feels a bit better after leaving the hospital but has been having worsening symptoms since.  Says he is feeling a bit better now compared to this a.m.  Says that he has been adhering to a low low-sodium diet.   ED Course: Afebrile, tachycardic, tachypneic, initially on 15 LPM via NRB but now on BiPAP. Notable Labs: Sodium 139, K3.3, BUN 16, creatinine 1.13, BNP 1075 (baseline is about 300-400), COVID-19 negative. Notable Imaging: CXR with interstitial opacities in the right mid and lower lung field concerning for either atelectasis, asymmetric edema or atypical/viral infection. Patient received albuterol continuously, doxycycline, Lasix was ordered but he refused, given Atrovent and steroids in route as above.  Per signout from the ED MD, the patient was initially tolerating 15 LPM but became acutely aggravated/angry at his nurse and then began tripoding and was placed on BiPAP and received Versed x1  Assessment & Plan:   Principal Problem:   Acute respiratory failure with hypoxia (HCC) Active Problems:    Schizoaffective disorder, bipolar type (HCC)   Essential hypertension   Cocaine abuse with cocaine-induced mood disorder (HCC)   Homelessness   QT prolongation   Acute on chronic combined systolic and diastolic CHF (congestive heart failure) (HCC)   COPD with acute exacerbation (HCC)  1 acute hypoxic respiratory failure likely multifactorial secondary to acute systolic heart failure and probable COPD exacerbation Patient presented with new increased O2 needs with hypoxia with sats of 79%.  Patient noted to have required BiPAP and currently on BiPAP at time of visit.  Some clinical improvement however some diffuse wheezing noted on examination, patient also volume overloaded.  Patient with good urine output of 5.3 L over the past 24 hours.  Current weight of 201.3 pounds.  Continue Lasix 40 mg IV every 12 hours, hydralazine, Imdur.  Due to ongoing wheezing discontinue oral prednisone and placed on Solu-Medrol 80 mg IV every 8 hours, Pulmicort, scheduled duo nebs, Claritin, Protonix, Flonase.  Will discontinue Incruse.  Strict I's and O's.  Daily weights.  Follow.  2.  Acute on chronic systolic and diastolic heart failure Patient noted to have an elevated BNP of 1075.9.  Patient with no acute chest pain.  Troponin of 36.  Patient with recent 2D echo (09/26/2020) with EF of 40 to 45%, left ventricular global hypokinesis, mild concentric LVH, grade 2 diastolic dysfunction, moderately dilated left atrial size, mildly dilated right atrial size. Patient recently hospitalized and discharged on 10/15/2020.  Patient stated he gets his paycheck once a month at the beginning of the month and as such unable to afford medications  from recent discharge and did not have any medications to take at home.  Patient somewhat frustrated.  Patient placed on Lasix 40 mg IV every 12 hours with excellent diuresis with a urine output of 3 L over the past 24 hours.  Patient already 3.9 L today.  Patient is -6.68 L during the  hospitalization.  Current weight of 201.3 pounds.  Continue Imdur, hydralazine.  Beta-blocker on hold due to acute CHF exacerbation.  Strict I's and O's.  Daily weights.  Social work to be consulted to aid patient with medications.  Will need outpatient follow-up with PCP and cardiology.  Supportive care.  Follow.  3.  Acute COPD exacerbation Patient with diffuse wheezing noted on examination.  With complaints of shortness of breath.  Discontinue oral prednisone and placed on IV Solu-Medrol 80 mg every 8 hours.  Discontinue Incruse and placed on scheduled duo nebs.  Placed on Pulmicort, Claritin, Flonase, PPI.  Outpatient follow-up with PCP.  May need outpatient follow-up with pulmonary.  4.  Prolonged QT Continue to hold QT prolongation medications.  Repeat EKG.  Keep potassium > 4, magnesium >2.  Follow.  5.  Schizoaffective disorder Patient seen in consultation by psychiatry who are recommending continuation of Depakote 500 mg twice daily, gabapentin 200 mg twice daily, valproic acid level.  Per psychiatry patient does not meet criteria for inpatient psychiatric admission.  Will need outpatient follow-up.  6.  Hypertension Continue hydralazine, Imdur, IV Lasix.  Clonidine, Coreg, lisinopril, spironolactone on hold.  Follow.  7.  Hyperlipidemia Continue statin.  8.  Polysubstance abuse UDS negative for cocaine.  Patient states has not used any cocaine since recent discharge.  Patient commended.  Follow.  9.  Hypokalemia Repleted.  Potassium at 4.7.  Follow.   DVT prophylaxis: Lovenox Code Status: Full Family Communication: Updated patient.  No family at bedside. Disposition:   Status is: Inpatient    Dispo: The patient is from: Home ??  Homeless              Anticipated d/c is to: Home              Anticipated d/c date is: 2 to 3 days.              Patient currently with acute respiratory failure secondary to CHF exacerbation, on IV diuretics, nebulizer treatments.  Not stable  for discharge.       Consultants:   Psychiatry: Hillery Jacks, NP 10/26/2020  Procedures:   Chest x-ray 10/26/2020    Antimicrobials:   None   Subjective: Patient laying in bed on BiPAP.  Patient seems somewhat agitated today.  Patient denies any chest pain.  Patient states still with shortness of breath on minimal exertion.  Feels a little bit better than he did on admission.  Patient also with complaints of some oral pain.  Patient denies any use of cocaine since prior discharge.  Objective: Vitals:   10/27/20 0500 10/27/20 0527 10/27/20 0752 10/27/20 0843  BP:  (!) 141/108    Pulse:  91    Resp:  20    Temp:      TempSrc:      SpO2:  100%  99%  Weight: 91.3 kg     Height:   5\' 9"  (1.753 m)     Intake/Output Summary (Last 24 hours) at 10/27/2020 1238 Last data filed at 10/27/2020 1100 Gross per 24 hour  Intake 2040 ml  Output 8700 ml  Net -6660 ml  Filed Weights   10/27/20 0500  Weight: 91.3 kg    Examination:  General exam: On BiPAP. Respiratory system: Diffuse scattered crackles.  Expiratory wheezing.  No rhonchi.  Speaking in full sentences.  No use of accessory muscles of respiration.   Cardiovascular system: S1 & S2 heard, RRR. No JVD, murmurs, rubs, gallops or clicks. No pedal edema. Gastrointestinal system: Abdomen is nondistended, soft and nontender. No organomegaly or masses felt. Normal bowel sounds heard. Central nervous system: Alert and oriented. No focal neurological deficits. Extremities: Symmetric 5 x 5 power. Skin: No rashes, lesions or ulcers Psychiatry: Judgement and insight appear normal. Mood & affect appropriate.     Data Reviewed: I have personally reviewed following labs and imaging studies  CBC: Recent Labs  Lab 10/26/20 0732 10/27/20 0520  WBC 7.6 15.1*  HGB 12.4* 12.6*  HCT 38.5* 39.0  MCV 102.7* 102.1*  PLT 235 248    Basic Metabolic Panel: Recent Labs  Lab 10/26/20 0732 10/27/20 0520  NA 139 139  K 3.3*  4.7  CL 108 104  CO2 22 23  GLUCOSE 90 185*  BUN 16 23*  CREATININE 1.13 1.26*  CALCIUM 7.8* 9.0  MG 2.5*  --     GFR: Estimated Creatinine Clearance: 76.5 mL/min (A) (by C-G formula based on SCr of 1.26 mg/dL (H)).  Liver Function Tests: No results for input(s): AST, ALT, ALKPHOS, BILITOT, PROT, ALBUMIN in the last 168 hours.  CBG: Recent Labs  Lab 10/26/20 1744 10/26/20 2042 10/27/20 0734 10/27/20 1131  GLUCAP 203* 240* 182* 160*     Recent Results (from the past 240 hour(s))  Resp Panel by RT-PCR (Flu A&B, Covid) Nasopharyngeal Swab     Status: None   Collection Time: 10/26/20  7:49 AM   Specimen: Nasopharyngeal Swab; Nasopharyngeal(NP) swabs in vial transport medium  Result Value Ref Range Status   SARS Coronavirus 2 by RT PCR NEGATIVE NEGATIVE Final    Comment: (NOTE) SARS-CoV-2 target nucleic acids are NOT DETECTED.  The SARS-CoV-2 RNA is generally detectable in upper respiratory specimens during the acute phase of infection. The lowest concentration of SARS-CoV-2 viral copies this assay can detect is 138 copies/mL. A negative result does not preclude SARS-Cov-2 infection and should not be used as the sole basis for treatment or other patient management decisions. A negative result may occur with  improper specimen collection/handling, submission of specimen other than nasopharyngeal swab, presence of viral mutation(s) within the areas targeted by this assay, and inadequate number of viral copies(<138 copies/mL). A negative result must be combined with clinical observations, patient history, and epidemiological information. The expected result is Negative.  Fact Sheet for Patients:  BloggerCourse.com  Fact Sheet for Healthcare Providers:  SeriousBroker.it  This test is no t yet approved or cleared by the [place] FDA and  has been authorized for detection and/or diagnosis of SARS-CoV-2 by FDA under an  Emergency Use Authorization (EUA). This EUA will remain  in effect (meaning this test can be used) for the duration of the COVID-19 declaration under Section 564(b)(1) of the Act, 21 U.S.C.section 360bbb-3(b)(1), unless the authorization is terminated  or revoked sooner.       Influenza A by PCR NEGATIVE NEGATIVE Final   Influenza B by PCR NEGATIVE NEGATIVE Final    Comment: (NOTE) The Xpert Xpress SARS-CoV-2/FLU/RSV plus assay is intended as an aid in the diagnosis of influenza from Nasopharyngeal swab specimens and should not be used as a sole basis for treatment. Nasal washings  and aspirates are unacceptable for Xpert Xpress SARS-CoV-2/FLU/RSV testing.  Fact Sheet for Patients: BloggerCourse.com  Fact Sheet for Healthcare Providers: SeriousBroker.it  This test is not yet approved or cleared by the [place] FDA and has been authorized for detection and/or diagnosis of SARS-CoV-2 by FDA under an Emergency Use Authorization (EUA). This EUA will remain in effect (meaning this test can be used) for the duration of the COVID-19 declaration under Section 564(b)(1) of the Act, 21 U.S.C. section 360bbb-3(b)(1), unless the authorization is terminated or revoked.  Performed at [facility], [address] [place], [name] [address]          Radiology Studies: DG Chest Port 1 View  Result Date: 10/26/2020 CLINICAL DATA:  "Dib." Additional history obtained from electronic MEDICAL RECORD NUMBERShortness of breath for 3 days, worsening this morning, history of CHF and COPD, patient reports rib and lower back pain. EXAM: PORTABLE CHEST 1 VIEW COMPARISON:  Prior chest radiographs 10/14/2020. FINDINGS: Unchanged cardiomegaly. The patient's chin partially obscures the medial lung apices. Subtle interstitial opacities within the right mid to lower lung field. The left lung is clear. No evidence of pleural effusion or  pneumothorax. No acute bony abnormality identified. IMPRESSION: The patient's chin partially obscures the medial lung apices Unchanged cardiomegaly. Subtle interstitial opacities within the right mid to lower lung field. Primary considerations include incomplete atelectasis, asymmetric edema or atypical/viral infection. Electronically Signed   By: Jackey Loge DO   On: 10/26/2020 08:25        Scheduled Meds: . budesonide (PULMICORT) nebulizer solution  0.5 mg Nebulization BID  . chlorhexidine  15 mL Mouth Rinse BID  . clopidogrel  75 mg Oral Daily  . divalproex  500 mg Oral BID  . enoxaparin (LOVENOX) injection  40 mg Subcutaneous Q24H  . furosemide  40 mg Intravenous BID  . gabapentin  200 mg Oral BID  . hydrALAZINE  50 mg Oral Q8H  . ipratropium-albuterol  3 mL Nebulization TID  . isosorbide mononitrate  30 mg Oral Daily  . loratadine  10 mg Oral Daily  . mouth rinse  15 mL Mouth Rinse q12n4p  . methylPREDNISolone (SOLU-MEDROL) injection  80 mg Intravenous Q8H  . midazolam  1 mg Intravenous Once  . pantoprazole  40 mg Oral Q0600  . senna-docusate  1 tablet Oral BID  . [START ON 10/28/2020] umeclidinium bromide  1 puff Inhalation Daily   Continuous Infusions:   LOS: 1 day    Time spent: 45 minutes    Ramiro Harvest, MD [facility]   To contact the attending provider between 7A-7P or the covering provider during after hours 7P-7A, please log into the web site [email or web address] and access using universal [place] password for that web site. If you do not have the password, please call the hospital operator.  10/27/2020, 12:38 PM

## 2020-10-28 DIAGNOSIS — J441 Chronic obstructive pulmonary disease with (acute) exacerbation: Secondary | ICD-10-CM | POA: Diagnosis not present

## 2020-10-28 DIAGNOSIS — I5043 Acute on chronic combined systolic (congestive) and diastolic (congestive) heart failure: Secondary | ICD-10-CM | POA: Diagnosis not present

## 2020-10-28 DIAGNOSIS — J9601 Acute respiratory failure with hypoxia: Secondary | ICD-10-CM | POA: Diagnosis not present

## 2020-10-28 DIAGNOSIS — F1414 Cocaine abuse with cocaine-induced mood disorder: Secondary | ICD-10-CM | POA: Diagnosis not present

## 2020-10-28 LAB — URINE CULTURE: Culture: NO GROWTH

## 2020-10-28 LAB — CBC
HCT: 41.4 % (ref 39.0–52.0)
Hemoglobin: 13.6 g/dL (ref 13.0–17.0)
MCH: 33.3 pg (ref 26.0–34.0)
MCHC: 32.9 g/dL (ref 30.0–36.0)
MCV: 101.5 fL — ABNORMAL HIGH (ref 80.0–100.0)
Platelets: 263 10*3/uL (ref 150–400)
RBC: 4.08 MIL/uL — ABNORMAL LOW (ref 4.22–5.81)
RDW: 16 % — ABNORMAL HIGH (ref 11.5–15.5)
WBC: 22.6 10*3/uL — ABNORMAL HIGH (ref 4.0–10.5)
nRBC: 0 % (ref 0.0–0.2)

## 2020-10-28 LAB — BASIC METABOLIC PANEL
Anion gap: 14 (ref 5–15)
Anion gap: 16 — ABNORMAL HIGH (ref 5–15)
BUN: 22 mg/dL — ABNORMAL HIGH (ref 6–20)
BUN: 25 mg/dL — ABNORMAL HIGH (ref 6–20)
CO2: 23 mmol/L (ref 22–32)
CO2: 20 mmol/L — ABNORMAL LOW (ref 22–32)
Calcium: 9.4 mg/dL (ref 8.9–10.3)
Calcium: 9.2 mg/dL (ref 8.9–10.3)
Chloride: 101 mmol/L (ref 98–111)
Chloride: 98 mmol/L (ref 98–111)
Creatinine, Ser: 1.44 mg/dL — ABNORMAL HIGH (ref 0.61–1.24)
Creatinine, Ser: 1.53 mg/dL — ABNORMAL HIGH (ref 0.61–1.24)
GFR, Estimated: 58 mL/min — ABNORMAL LOW (ref >60)
GFR, Estimated: 54 mL/min — ABNORMAL LOW (ref >60)
Glucose, Bld: 386 mg/dL — ABNORMAL HIGH (ref 70–99)
Glucose, Bld: 466 mg/dL — ABNORMAL HIGH (ref 70–99)
Potassium: 4.4 mmol/L (ref 3.5–5.1)
Potassium: 4.2 mmol/L (ref 3.5–5.1)
Sodium: 138 mmol/L (ref 135–145)
Sodium: 134 mmol/L — ABNORMAL LOW (ref 135–145)

## 2020-10-28 LAB — GLUCOSE, CAPILLARY
Glucose-Capillary: 320 mg/dL — ABNORMAL HIGH (ref 70–99)
Glucose-Capillary: 421 mg/dL — ABNORMAL HIGH (ref 70–99)
Glucose-Capillary: 236 mg/dL — ABNORMAL HIGH (ref 70–99)
Glucose-Capillary: 278 mg/dL — ABNORMAL HIGH (ref 70–99)

## 2020-10-28 LAB — TROPONIN I (HIGH SENSITIVITY)
Troponin I (High Sensitivity): 37 ng/L — ABNORMAL HIGH (ref <18)
Troponin I (High Sensitivity): 31 ng/L — ABNORMAL HIGH (ref <18)

## 2020-10-28 MED ORDER — HYDROXYZINE HCL 50 MG PO TABS: 50.0000 mg | ORAL_TABLET | Freq: Three times a day (TID) | ORAL | Status: DC | PRN

## 2020-10-28 MED ORDER — THIAMINE HCL 100 MG/ML IJ SOLN: 100.0000 mg | Freq: Every day | INTRAMUSCULAR | Status: DC

## 2020-10-28 MED ORDER — FOLIC ACID 1 MG PO TABS
1.0000 mg | ORAL_TABLET | Freq: Every day | ORAL | Status: DC
  Administered 2020-10-29 – 2020-10-31 (×3): 1 mg via ORAL
  Filled 2020-10-28 (×3): qty 1

## 2020-10-28 MED ORDER — LORAZEPAM 1 MG PO TABS
1.0000 mg | ORAL_TABLET | ORAL | Status: DC | PRN
  Administered 2020-10-28 – 2020-10-29 (×2): 1 mg via ORAL
  Administered 2020-10-30: 2 mg via ORAL
  Filled 2020-10-28 (×3): qty 1
  Filled 2020-10-28: qty 2

## 2020-10-28 MED ORDER — FUROSEMIDE 40 MG PO TABS
40.0000 mg | ORAL_TABLET | Freq: Every day | ORAL | Status: DC
  Administered 2020-10-29 – 2020-10-31 (×3): 40 mg via ORAL
  Filled 2020-10-28 (×3): qty 1

## 2020-10-28 MED ORDER — FUROSEMIDE 40 MG PO TABS: 40.0000 mg | ORAL_TABLET | Freq: Two times a day (BID) | ORAL | Status: DC

## 2020-10-28 MED ORDER — LORAZEPAM 2 MG/ML IJ SOLN
0.0000 mg | Freq: Four times a day (QID) | INTRAMUSCULAR | Status: AC
  Administered 2020-10-29 – 2020-10-30 (×2): 2 mg via INTRAVENOUS
  Administered 2020-10-30: 3 mg via INTRAVENOUS
  Administered 2020-10-30: 2 mg via INTRAVENOUS
  Filled 2020-10-28 (×3): qty 1
  Filled 2020-10-28: qty 2

## 2020-10-28 MED ORDER — CARVEDILOL 3.125 MG PO TABS
3.1250 mg | ORAL_TABLET | Freq: Two times a day (BID) | ORAL | Status: DC
  Administered 2020-10-28 – 2020-10-31 (×6): 3.125 mg via ORAL
  Filled 2020-10-28 (×6): qty 1

## 2020-10-28 MED ORDER — ADULT MULTIVITAMIN W/MINERALS CH
1.0000 | ORAL_TABLET | Freq: Every day | ORAL | Status: DC
  Administered 2020-10-30: 1 via ORAL
  Filled 2020-10-28 (×4): qty 1

## 2020-10-28 MED ORDER — INSULIN ASPART 100 UNIT/ML ~~LOC~~ SOLN
25.0000 [IU] | Freq: Once | SUBCUTANEOUS | Status: AC
  Administered 2020-10-28: 25 [IU] via SUBCUTANEOUS

## 2020-10-28 MED ORDER — DOXYCYCLINE HYCLATE 100 MG PO TABS
100.0000 mg | ORAL_TABLET | Freq: Two times a day (BID) | ORAL | Status: DC
  Administered 2020-10-29 – 2020-10-31 (×5): 100 mg via ORAL
  Filled 2020-10-28 (×5): qty 1

## 2020-10-28 MED ORDER — THIAMINE HCL 100 MG PO TABS
100.0000 mg | ORAL_TABLET | Freq: Every day | ORAL | Status: DC
  Administered 2020-10-29 – 2020-10-31 (×3): 100 mg via ORAL
  Filled 2020-10-28 (×4): qty 1

## 2020-10-28 MED ORDER — INSULIN ASPART 100 UNIT/ML ~~LOC~~ SOLN
0.0000 [IU] | Freq: Three times a day (TID) | SUBCUTANEOUS | Status: DC
  Administered 2020-10-28: 7 [IU] via SUBCUTANEOUS
  Administered 2020-10-30: 20 [IU] via SUBCUTANEOUS
  Administered 2020-10-30: 4 [IU] via SUBCUTANEOUS
  Administered 2020-10-31: 7 [IU] via SUBCUTANEOUS
  Administered 2020-10-31: 11 [IU] via SUBCUTANEOUS

## 2020-10-28 MED ORDER — ATORVASTATIN CALCIUM 20 MG PO TABS
20.0000 mg | ORAL_TABLET | Freq: Every day | ORAL | Status: DC
  Administered 2020-10-28 – 2020-10-31 (×4): 20 mg via ORAL
  Filled 2020-10-28 (×4): qty 1

## 2020-10-28 MED ORDER — LORAZEPAM 2 MG/ML IJ SOLN: 1.0000 mg | INTRAMUSCULAR | Status: DC | PRN

## 2020-10-28 MED ORDER — INSULIN GLARGINE 100 UNIT/ML ~~LOC~~ SOLN
8.0000 [IU] | Freq: Every day | SUBCUTANEOUS | Status: DC
  Administered 2020-10-28: 8 [IU] via SUBCUTANEOUS
  Filled 2020-10-28: qty 0.08

## 2020-10-28 MED ORDER — INSULIN GLARGINE 100 UNIT/ML ~~LOC~~ SOLN
6.0000 [IU] | Freq: Once | SUBCUTANEOUS | Status: AC
  Administered 2020-10-28: 6 [IU] via SUBCUTANEOUS
  Filled 2020-10-28: qty 0.06

## 2020-10-28 MED ORDER — INSULIN GLARGINE 100 UNIT/ML ~~LOC~~ SOLN
14.0000 [IU] | Freq: Every day | SUBCUTANEOUS | Status: DC
  Filled 2020-10-28: qty 0.14

## 2020-10-28 MED ORDER — LORAZEPAM 2 MG/ML IJ SOLN
1.0000 mg | Freq: Once | INTRAMUSCULAR | Status: AC
  Administered 2020-10-28: 1 mg via INTRAVENOUS
  Filled 2020-10-28: qty 1

## 2020-10-28 MED ORDER — LORAZEPAM 2 MG/ML IJ SOLN
0.0000 mg | Freq: Two times a day (BID) | INTRAMUSCULAR | Status: DC
  Administered 2020-10-31: 2 mg via INTRAVENOUS
  Filled 2020-10-28: qty 1

## 2020-10-28 NOTE — Progress Notes (Signed)
Patient refused continuous pulse ox. He stated " I cant do that".

## 2020-10-28 NOTE — Progress Notes (Addendum)
PROGRESS NOTE    [name]  ZOX:[id number] DOB: 07-04-1966 DOA: 10/26/2020 PCP: Patient, No Pcp Per    Chief Complaint  Patient presents with  . Respiratory Distress    Brief Narrative:  HPI per Dr. Dairl Ponder This is a 54 year old male with past medical history of diabetes, HFrEF, COPD, schizoaffective disorder, prolonged QT, polysubstance abuse, tobacco use, recent hospitalization from 11/15-11/18 for COPD exacerbation and heart failure exacerbation who presents to the ED via EMS for worsening shortness of breath x3 days which acutely worsened this morning.  Upon arrival, EMS reported air saturation to be 79% and he was given albuterol and Atrovent, Solu-Medrol 125 mg and 2 g magnesium and route.  Placed on NRB.  Symptoms worse with ambulation and lying flat.  No chest pain.  States he has not used tobacco or cocaine in the past week.  Has a nonproductive cough.  Says that he initially feels a bit better after leaving the hospital but has been having worsening symptoms since.  Says he is feeling a bit better now compared to this a.m.  Says that he has been adhering to a low low-sodium diet.   ED Course: Afebrile, tachycardic, tachypneic, initially on 15 LPM via NRB but now on BiPAP. Notable Labs: Sodium 139, K3.3, BUN 16, creatinine 1.13, BNP 1075 (baseline is about 300-400), COVID-19 negative. Notable Imaging: CXR with interstitial opacities in the right mid and lower lung field concerning for either atelectasis, asymmetric edema or atypical/viral infection. Patient received albuterol continuously, doxycycline, Lasix was ordered but he refused, given Atrovent and steroids in route as above.  Per signout from the ED MD, the patient was initially tolerating 15 LPM but became acutely aggravated/angry at his nurse and then began tripoding and was placed on BiPAP and received Versed x1  Assessment & Plan:   Principal Problem:   Acute respiratory failure with hypoxia (HCC) Active Problems:    Schizoaffective disorder, bipolar type (HCC)   Essential hypertension   Cocaine abuse with cocaine-induced mood disorder (HCC)   Homelessness   QT prolongation   Acute on chronic combined systolic and diastolic CHF (congestive heart failure) (HCC)   COPD with acute exacerbation (HCC)   Hypokalemia   Hyperlipidemia  1 acute hypoxic respiratory failure likely multifactorial secondary to acute systolic heart failure and probable COPD exacerbation Patient presented with new increased O2 needs with hypoxia with sats of 79%.  Patient noted to have required BiPAP on admission.  BiPAP discontinued patient placed on CPAP.  On nasal cannula.  Patient with complaints of worsening shortness of breath, patient noted to have some diffuse wheezing.  Patient also with a component of anxiety noted.  Patient was volume overloaded on presentation and has diuresed aggressively with diuretics.  Patient with a urine output of 8.4 L over the past 24 hours.  Patient is -9.234 L during this hospitalization.  Current weight of 195.6 pounds from 201.3 pounds.  Continue current IV Solu-Medrol, Pulmicort, scheduled duo nebs, Claritin, Flonase, Protonix.  We will give a dose of Ativan 1 mg IV x1.  Increase hydroxyzine to 50 mg every 8 hours as needed.  Transition from IV Lasix to home dose oral Lasix 40 mg daily.  Strict I's and O's.  Daily weights.  2.  Acute on chronic systolic and diastolic heart failure Patient noted to have an elevated BNP of 1075.9.  Patient with no acute chest pain.  Troponin of 36.  Patient with recent 2D echo (09/26/2020) with EF of 40 to 45%,  left ventricular global hypokinesis, mild concentric LVH, grade 2 diastolic dysfunction, moderately dilated left atrial size, mildly dilated right atrial size. Patient recently hospitalized and discharged on 10/15/2020.  Patient stated he gets his paycheck once a month at the beginning of the month and as such unable to afford medications from recent discharge and  did not have any medications to take at home.  Patient somewhat frustrated.  Patient placed on Lasix 40 mg IV every 12 hours with excellent diuresis with a urine output of 8.450 L over the past 24 hours.  Patient with a urine output of 1.6 L already today.  Patient is -9.234 L during this hospitalization.  Current weight of 195.6 pounds from 201.3 pounds (08/27/2020).  ).  Patient with abdominal pain BUN with clinical improvement with volume status.  Change IV Lasix to oral Lasix 40 mg daily.  Continue Imdur, hydralazine.  Resume Coreg.  Will need outpatient follow-up with PCP and cardiology.  Supportive care.  3.  Acute COPD exacerbation Patient with diffuse wheezing noted on examination.  With complaints of shortness of breath.  Discontinued oral prednisone and placed on IV Solu-Medrol 80 mg every 8 hours which we will continue.  Continue Pulmicort, Claritin, Flonase, PPI, scheduled duo nebs.  CPAP nightly.  Will need outpatient follow-up with PCP.  4.  Prolonged QT Continue to hold QT prolongation medications.  Repeat EKG pending.Marland Kitchen  Keep potassium > 4, magnesium >2.  Follow.  5.  Schizoaffective disorder Patient seen in consultation by psychiatry who are recommending continuation of Depakote 500 mg twice daily, gabapentin 200 mg twice daily, valproic acid level.  Per psychiatry patient does not meet criteria for inpatient psychiatric admission.  Will need outpatient follow-up.  6.  Hypertension Stable.  Continue current regimen of hydralazine, Imdur.  Resume home regimen Coreg.  Continue to hold lisinopril and spironolactone.   7.  Hyperlipidemia Continue statin.  8.  Polysubstance abuse UDS negative for cocaine.  Patient states has not used any cocaine since recent discharge.  Patient commended.  Follow.  9.  Hypokalemia Repleted.  Potassium of 4.2.  10.  Hyperglycemia Likely steroid-induced.  Hemoglobin A1c noted at 5.9 (09/09/2020).  CBG at 320 this morning.  Start Lantus 8 units daily.   Placed on resistant sliding scale insulin.  Follow.   DVT prophylaxis: Lovenox Code Status: Full Family Communication: Updated patient.  No family at bedside. Disposition:   Status is: Inpatient    Dispo: The patient is from: Home ??  Homeless              Anticipated d/c is to: Home              Anticipated d/c date is: 2 to 3 days.              Patient currently with acute respiratory failure secondary to CHF exacerbation and COPD exacerbation, on IV steroids, on IV diuretics, nebulizer treatments.  Not stable for discharge.       Consultants:   Psychiatry: Hillery Jacks, NP 10/26/2020  Procedures:   Chest x-ray 10/26/2020    Antimicrobials:   Doxycycline 10/28/2020   Subjective: Patient lying in bed on nasal cannula.  Patient seems somewhat anxious.  Patient still complaining of significant shortness of breath.  Patient feeling CPAP works better.  Denies any chest pain.  No abdominal pain.  Somewhat hyperventilating as well at the side of the bed.  Per RN note patient noted to be noncompliant with oxygen needs and fluid  restrictions early on this morning, also noted to have been verbally aggressive to staff.  Objective: Vitals:   10/27/20 2130 10/27/20 2131 10/28/20 0531 10/28/20 0833  BP:   (!) 165/101   Pulse: 96  91   Resp: 18  20   Temp:      TempSrc:      SpO2: 99% 99% 100% 100%  Weight:   88.7 kg   Height:        Intake/Output Summary (Last 24 hours) at 10/28/2020 1252 Last data filed at 10/28/2020 1000 Gross per 24 hour  Intake 2990 ml  Output 6300 ml  Net -3310 ml   Filed Weights   10/27/20 0500 10/28/20 0531  Weight: 91.3 kg 88.7 kg    Examination:  General exam: On [place]. Respiratory system: Some diffuse expiratory wheezing.  No crackles noted.  No use of accessory muscles of respiration.  Speaking in full sentences.   Cardiovascular system: Regular rate rhythm no murmurs rubs or gallops.  No JVD.  No lower extremity edema.   Gastrointestinal system: Abdomen is soft, nontender, nondistended, positive bowel sounds.  No rebound.  No guarding.  Central nervous system: Alert and oriented. No focal neurological deficits. Extremities: Symmetric 5 x 5 power. Skin: No rashes, lesions or ulcers Psychiatry: Judgement and insight appear normal. Mood & affect appropriate.     Data Reviewed: I have personally reviewed following labs and imaging studies  CBC: Recent Labs  Lab 10/26/20 0732 10/27/20 0520 10/28/20 0622  WBC 7.6 15.1* 22.6*  HGB 12.4* 12.6* 13.6  HCT 38.5* 39.0 41.4  MCV 102.7* 102.1* 101.5*  PLT 235 248 263    Basic Metabolic Panel: Recent Labs  Lab 10/26/20 0732 10/27/20 0520 10/28/20 0622  NA 139 139 138  K 3.3* 4.7 4.4  CL 108 104 101  CO2 22 23 23   GLUCOSE 90 185* 386*  BUN 16 23* 22*  CREATININE 1.13 1.26* 1.44*  CALCIUM 7.8* 9.0 9.4  MG 2.5*  --   --     GFR: Estimated Creatinine Clearance: 66.1 mL/min (A) (by C-G formula based on SCr of 1.44 mg/dL (H)).  Liver Function Tests: No results for input(s): AST, ALT, ALKPHOS, BILITOT, PROT, ALBUMIN in the last 168 hours.  CBG: Recent Labs  Lab 10/27/20 0734 10/27/20 1131 10/27/20 2027 10/28/20 0739 10/28/20 1137  GLUCAP 182* 160* 375* 320* 421*     Recent Results (from the past 240 hour(s))  Resp Panel by RT-PCR (Flu A&B, Covid) Nasopharyngeal Swab     Status: None   Collection Time: 10/26/20  7:49 AM   Specimen: Nasopharyngeal Swab; Nasopharyngeal(NP) swabs in vial transport medium  Result Value Ref Range Status   SARS Coronavirus 2 by RT PCR NEGATIVE NEGATIVE Final    Comment: (NOTE) SARS-CoV-2 target nucleic acids are NOT DETECTED.  The SARS-CoV-2 RNA is generally detectable in upper respiratory specimens during the acute phase of infection. The lowest concentration of SARS-CoV-2 viral copies this assay can detect is 138 copies/mL. A negative result does not preclude SARS-Cov-2 infection and should not be used  as the sole basis for treatment or other patient management decisions. A negative result may occur with  improper specimen collection/handling, submission of specimen other than nasopharyngeal swab, presence of viral mutation(s) within the areas targeted by this assay, and inadequate number of viral copies(<138 copies/mL). A negative result must be combined with clinical observations, patient history, and epidemiological information. The expected result is Negative.  Fact Sheet for Patients:  10/28/20  Fact Sheet for Healthcare Providers:  SeriousBroker.it  This test is no t yet approved or cleared by the [place] FDA and  has been authorized for detection and/or diagnosis of SARS-CoV-2 by FDA under an Emergency Use Authorization (EUA). This EUA will remain  in effect (meaning this test can be used) for the duration of the COVID-19 declaration under Section 564(b)(1) of the Act, 21 U.S.C.section 360bbb-3(b)(1), unless the authorization is terminated  or revoked sooner.       Influenza A by PCR NEGATIVE NEGATIVE Final   Influenza B by PCR NEGATIVE NEGATIVE Final    Comment: (NOTE) The Xpert Xpress SARS-CoV-2/FLU/RSV plus assay is intended as an aid in the diagnosis of influenza from Nasopharyngeal swab specimens and should not be used as a sole basis for treatment. Nasal washings and aspirates are unacceptable for Xpert Xpress SARS-CoV-2/FLU/RSV testing.  Fact Sheet for Patients: BloggerCourse.com  Fact Sheet for Healthcare Providers: SeriousBroker.it  This test is not yet approved or cleared by the [place] FDA and has been authorized for detection and/or diagnosis of SARS-CoV-2 by FDA under an Emergency Use Authorization (EUA). This EUA will remain in effect (meaning this test can be used) for the duration of the COVID-19 declaration under Section 564(b)(1)  of the Act, 21 U.S.C. section 360bbb-3(b)(1), unless the authorization is terminated or revoked.  Performed at [facility], [address], [place], [name] [address]   Culture, Urine     Status: None   Collection Time: 10/27/20  8:14 AM   Specimen: Urine, Clean Catch  Result Value Ref Range Status   Specimen Description   Final    URINE, CLEAN CATCH Performed at [facility], [address], [place], [name] [address]    Special Requests   Final    CATH Performed at [facility], [address] [place], [name] [address]    Culture   Final    NO GROWTH Performed at [facility], [address], [place], [name] [address]    Report Status 10/28/2020 FINAL  Final         Radiology Studies: No results found.      Scheduled Meds: . atorvastatin  20 mg Oral Daily  . budesonide (PULMICORT) nebulizer solution  0.5 mg Nebulization BID  . carvedilol  3.125 mg Oral BID WC  . chlorhexidine  15 mL Mouth Rinse BID  . clopidogrel  75 mg Oral Daily  . divalproex  500 mg Oral BID  . enoxaparin (LOVENOX) injection  40 mg Subcutaneous Q24H  . furosemide  40 mg Oral BID  . gabapentin  200 mg Oral BID  . hydrALAZINE  50 mg Oral Q8H  . insulin aspart  0-20 Units Subcutaneous TID WC  . insulin glargine  8 Units Subcutaneous Daily  . ipratropium-albuterol  3 mL Nebulization TID  . isosorbide mononitrate  30 mg Oral Daily  . loratadine  10 mg Oral Daily  . magic mouthwash w/lidocaine  15 mL Oral QID  . mouth rinse  15 mL Mouth Rinse q12n4p  . methylPREDNISolone (SOLU-MEDROL) injection  80 mg Intravenous Q8H  . midazolam  1 mg Intravenous Once  . pantoprazole  40 mg Oral Q0600   Continuous Infusions:   LOS: 2 days    Time spent: 40 minutes    Ramiro Harvest, MD [facility]   To contact the attending provider between 7A-7P or the covering provider during after hours 7P-7A, please log into the  web  site [email or web address] and access using universal  password for that web site. If you do not have the password, please call the hospital operator.  10/28/2020, 12:52 PM

## 2020-10-28 NOTE — Plan of Care (Signed)
Plan of care reviewed and discussed with the patient.  Patient remains very irritable and contrary.  Refuses to accept explanation re; meds, treatments.

## 2020-10-28 NOTE — Progress Notes (Signed)
Patient agreed for CN to apply tele leads.

## 2020-10-28 NOTE — Progress Notes (Signed)
OT Cancellation Note  Patient Details Name: [name] MRN: [id number] DOB: 29-Mar-1966   Cancelled Treatment:    Reason Eval/Treat Not Completed: Patient declined, no reason specified. Patient refusing multiple services during hospital admission and declined therapy attempts x 3 this morning. Patient also verbally abusive to staff. Per nursing patient able to perform mobility and ADLs when he wants too. Will sign off at this time. If patient agreeable and needs identified please reorder as needed.  [name] 10/28/2020, 1:47 PM

## 2020-10-28 NOTE — Progress Notes (Signed)
PT Cancellation Note  Patient Details Name: [name] MRN: [id number] DOB: 1966/06/27   Cancelled Treatment:    Reason Eval/Treat Not Completed: Patient declined, no reason specified. Attempted x3 to see pt. Refused for various reasons. Pt appears to be independent with bed mobility, RN confirms he is mobilizing without difficulty.  PT signing off. Pt is verbally abusive to PT/other staff at this time    [facility] 10/28/2020, 12:51 PM

## 2020-10-28 NOTE — Progress Notes (Signed)
Patient pulled off all leads. Educated and encouraged to allow nurse reapply leads. Patient refused.

## 2020-10-28 NOTE — Progress Notes (Addendum)
0800 Patient is non compliant with oxygen needs and fluid restrictions. Request mug to be filled with ice water. I filled with ice and little water. Patient not happy with many parts of stay and I don't think we will change his mind.   1125 I had words with him earlier about how he treats the techs and nursing staff, he did apologize when I went back in. Now he is refusing lab draw and tele leads because he has not seen a Dr. yet   1355 Patient is very non- compliant with the fluid restriction. He has asked for ice and ice cream, and cokes all shift this far. If you tell him about the fluid restriction he gets verbally aggressive.  1614 Patient was asking for a LIQUOR store's zip code in [place] and when told we couldn't find it he refused vital signs. He called out for pain meds and when I went into take them to him he kicked me out saying "he was doing something" He is sitting on the couch in room without oxygen and he is breathing heavy. Is not compliant with oxygen needs.

## 2020-10-29 ENCOUNTER — Inpatient Hospital Stay (HOSPITAL_COMMUNITY): Payer: Medicaid - Out of State

## 2020-10-29 DIAGNOSIS — J9601 Acute respiratory failure with hypoxia: Secondary | ICD-10-CM | POA: Diagnosis not present

## 2020-10-29 DIAGNOSIS — J441 Chronic obstructive pulmonary disease with (acute) exacerbation: Secondary | ICD-10-CM | POA: Diagnosis not present

## 2020-10-29 DIAGNOSIS — I5043 Acute on chronic combined systolic (congestive) and diastolic (congestive) heart failure: Secondary | ICD-10-CM | POA: Diagnosis not present

## 2020-10-29 DIAGNOSIS — F1414 Cocaine abuse with cocaine-induced mood disorder: Secondary | ICD-10-CM | POA: Diagnosis not present

## 2020-10-29 LAB — BASIC METABOLIC PANEL
Anion gap: 14 (ref 5–15)
BUN: 31 mg/dL — ABNORMAL HIGH (ref 6–20)
CO2: 24 mmol/L (ref 22–32)
Calcium: 8.9 mg/dL (ref 8.9–10.3)
Chloride: 99 mmol/L (ref 98–111)
Creatinine, Ser: 1.38 mg/dL — ABNORMAL HIGH (ref 0.61–1.24)
GFR, Estimated: >60 mL/min (ref >60)
Glucose, Bld: 465 mg/dL — ABNORMAL HIGH (ref 70–99)
Potassium: 4 mmol/L (ref 3.5–5.1)
Sodium: 137 mmol/L (ref 135–145)

## 2020-10-29 LAB — CBC
HCT: 41.7 % (ref 39.0–52.0)
Hemoglobin: 13.7 g/dL (ref 13.0–17.0)
MCH: 33.7 pg (ref 26.0–34.0)
MCHC: 32.9 g/dL (ref 30.0–36.0)
MCV: 102.5 fL — ABNORMAL HIGH (ref 80.0–100.0)
Platelets: 242 10*3/uL (ref 150–400)
RBC: 4.07 MIL/uL — ABNORMAL LOW (ref 4.22–5.81)
RDW: 16.1 % — ABNORMAL HIGH (ref 11.5–15.5)
WBC: 20.7 10*3/uL — ABNORMAL HIGH (ref 4.0–10.5)
nRBC: 0 % (ref 0.0–0.2)

## 2020-10-29 LAB — GLUCOSE, CAPILLARY: Glucose-Capillary: 271 mg/dL — ABNORMAL HIGH (ref 70–99)

## 2020-10-29 LAB — MAGNESIUM: Magnesium: 1.8 mg/dL (ref 1.7–2.4)

## 2020-10-29 LAB — BRAIN NATRIURETIC PEPTIDE: B Natriuretic Peptide: 322.3 pg/mL — ABNORMAL HIGH (ref 0.0–100.0)

## 2020-10-29 IMAGING — DX DG CHEST 1V PORT
1 series · 1 of 1 positions shown · non-contrast
Comparison: [DATE]

CLINICAL DATA: Shortness of breath.  Hypoxia.

EXAM:
PORTABLE CHEST 1 VIEW

[chest ap]
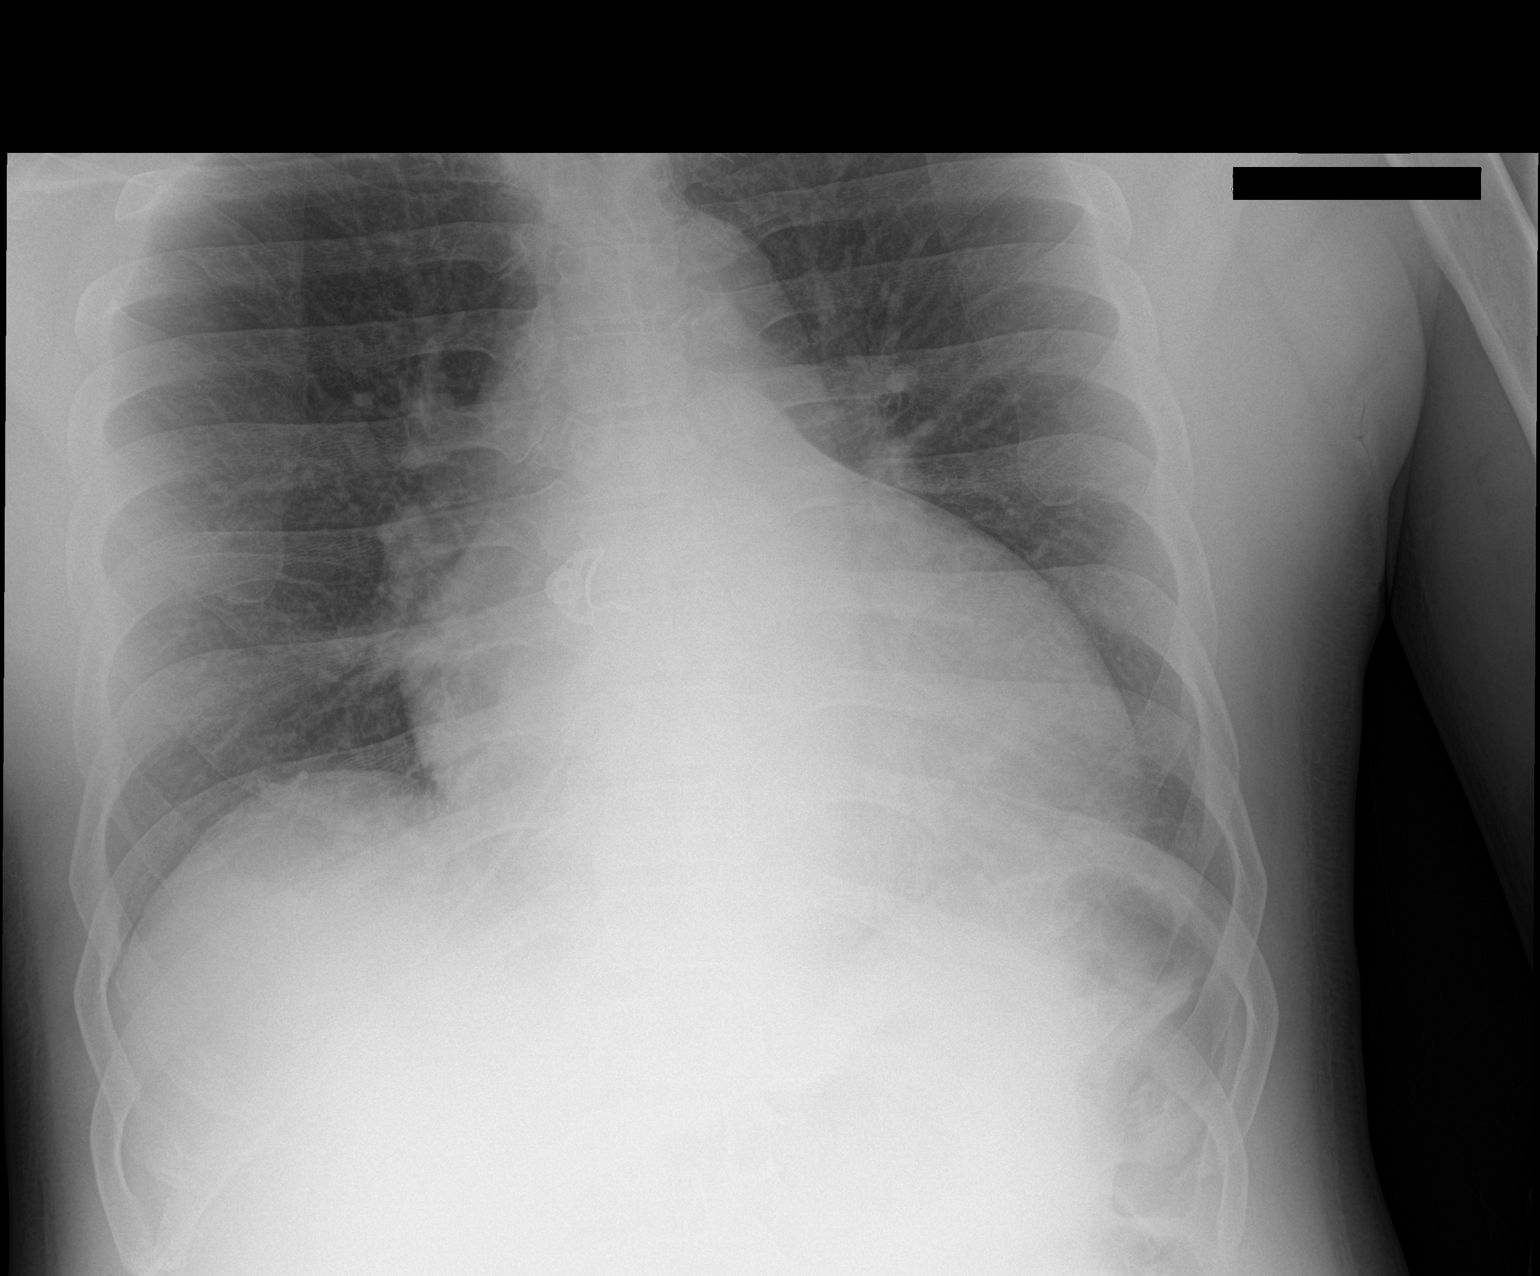

[1 of 1 positions shown; findings below may reference images not displayed]

FINDINGS: Mild enlargement of the cardiopericardial silhouette. The lungs
appear clear. No blunting of the costophrenic angles. No significant
bony abnormality observed.
IMPRESSION: 1. Mild enlargement of the cardiopericardial silhouette, without
edema.

## 2020-10-29 MED ORDER — INSULIN ASPART 100 UNIT/ML ~~LOC~~ SOLN
28.0000 [IU] | Freq: Once | SUBCUTANEOUS | Status: AC
  Administered 2020-10-29: 28 [IU] via SUBCUTANEOUS

## 2020-10-29 MED ORDER — METHYLPREDNISOLONE SODIUM SUCC 125 MG IJ SOLR
60.0000 mg | Freq: Three times a day (TID) | INTRAMUSCULAR | Status: DC
  Administered 2020-10-29 – 2020-10-30 (×3): 60 mg via INTRAVENOUS
  Filled 2020-10-29 (×3): qty 2

## 2020-10-29 MED ORDER — INSULIN GLARGINE 100 UNIT/ML ~~LOC~~ SOLN
20.0000 [IU] | Freq: Every day | SUBCUTANEOUS | Status: DC
  Administered 2020-10-30 – 2020-10-31 (×2): 20 [IU] via SUBCUTANEOUS
  Filled 2020-10-29 (×3): qty 0.2

## 2020-10-29 MED ORDER — OXYCODONE HCL 5 MG PO TABS
5.0000 mg | ORAL_TABLET | ORAL | Status: DC | PRN
  Administered 2020-10-29 – 2020-10-31 (×8): 10 mg via ORAL
  Filled 2020-10-29 (×8): qty 2

## 2020-10-29 MED ORDER — SPIRONOLACTONE 12.5 MG HALF TABLET
12.5000 mg | ORAL_TABLET | Freq: Every day | ORAL | Status: DC
  Administered 2020-10-29 – 2020-10-31 (×2): 12.5 mg via ORAL
  Filled 2020-10-29 (×3): qty 1

## 2020-10-29 MED ORDER — INSULIN ASPART 100 UNIT/ML ~~LOC~~ SOLN
6.0000 [IU] | Freq: Three times a day (TID) | SUBCUTANEOUS | Status: DC
  Administered 2020-10-30 – 2020-10-31 (×3): 6 [IU] via SUBCUTANEOUS

## 2020-10-29 NOTE — Progress Notes (Signed)
NT at bedside as pt walked to front desk asking where his breakfast was at. NT found RN and asked about pt's food again. RN went to pt's bedside to inform him that he is over his restriction amounts for today and his next meal tray would be delivered around 5pm for supper. Pt says "I'M ON THE PHONE." RN reiterates the next time meal will be delivered. Pt says "whatever I'm on the phone." RN told pt to call with any needs.

## 2020-10-29 NOTE — Progress Notes (Addendum)
RN, MD, and NT at bedside. RN made MD aware of pt refusal of treatments ordered. While MD present, pt agreed to let NT obtain VS's. Pt also agreed to wear telemetry monitor for MD.  After MD left bedside, pt became verbally aggressive and demanding more ice cream and pain medication. This RN reported to pt that his pain medications were given already this morning and they were not due again until 1230. Pt says "whatever." Pt also pulled telemetry monitor back off and laid it on bedside table.

## 2020-10-29 NOTE — Progress Notes (Signed)
Patient takes on and off CPAP when he feels like he needs it

## 2020-10-29 NOTE — Progress Notes (Signed)
The patient called out requesting pain meds @ 2330.  I spoke with him re: our previous conversation @ 2100, telling him he could get pain meds again @ MN.  He stated he was hungry, I told him I would bring him a sandwich when I brought his pain med.  He stated, "I'm hungry NOW!"  I replied that I would go him a sandwich and drink now.  Gave him both right away and told him I would see him shortly with his pain med.  He called out again @ 2355, I got his Oxy IR and an Ativan 1 mg tablet and went to his room.  He said, "What are you giving me?:"  I stated, " I've got your pain med we had talked about, and an Ativan again".  He replied, " You need to tell me what you're trying to give me before you give it to me."  I responded, "  You asked me what I was giving you, and I answered you."  He huffed, " I can see we have a problem and we're gonna continue to have a problem.  Take your medicine, get out, and do what ever you need to do with it. I'll get the supervisor or Charge Nurse in here to give it to me." I told him I was in charge of his care tonight, and that I would be glad for him to go ahead and take his med. But, "if you aren't willing to take it, no one else will be giving it to you either."  He again told me to get out of his room and proceeded to press his call light. Around 0010, Clista Bernhardt, RN, [facility], came to the department.  He spoke with me re: the above interaction, then went and spoke with the patient.  He informed the patient that he had been offered pain meds and Ativan, and refused them, so he would not be giving him any medication.  Joe then informed me of the conversation he had with the patient.

## 2020-10-29 NOTE — Progress Notes (Signed)
1230-RN at bedside to attempt to obtain CBG. Pt refused and said "I am on the phone." RN reports to patient I can come back. Pt states "just put refused, f**k it." RN encouraged pt to call with needs.   1240-lab tech at bedside to obtain labs. Pt reports "I am on the phone and eating. Come back." Pt is currently on the phone cursing at the dietary service team that took his breakfast order stating he never received it. This RN interrupted his conversation and let him know that I delivered his breakfast tray myself because he was excited about the 2 strawberry ice creams. At that point pt responds on the phone with "ok, I'm sorry ma'am."   1255- lab tech returns to bedside and has a student with her. Pt kicks the student out of the room and states "there only needs to be one person in here at a time." Pt allows lab tech to draw blood but gets verbally aggressive with her when he asked her for more ice cream and she reported she could not bring him any with his fluid restriction. Upon her exiting room, dietary brings lunch tray and pt ate 100% of his meal.

## 2020-10-29 NOTE — Progress Notes (Signed)
RN at bedside for AM assessment. Pt refuses to let RN auscultate lung and heart sounds. RN turned to write name on communication board in the room and patient says "ok just come listen and do whatever you got to do. Can I get my pain meds." RN verified time for pain medications and reported to pt I would go get it and come back. Pt pleasant and follows instructions, however is verbally aggressive and demanding.

## 2020-10-29 NOTE — Progress Notes (Signed)
Patient is requesting pain meds. Informed him of time of last dose was 0123 and told him I would bring pain med at 0515. He voiced u/o timing.

## 2020-10-29 NOTE — Progress Notes (Signed)
RN received call from dietary service response. Pt continues to call and constantly order meal trays causing him to go over his fluid restrictions. Dietary wanted to know if pt had been receiving meal trays as he continues to tell them he has not. RN assured dietary pt has ate both breakfast and lunch today with several additional snacks provided by nursing staff. Dietary  Staff voiced appreciation for update.

## 2020-10-29 NOTE — Progress Notes (Signed)
PROGRESS NOTE    [name]  EHU:[id number] DOB: Apr 21, 1966 DOA: 10/26/2020 PCP: Patient, No Pcp Per    Chief Complaint  Patient presents with   Respiratory Distress    Brief Narrative:  HPI per Dr. Neysa Bonito This is a 54 year old male with past medical history of diabetes, HFrEF, COPD, schizoaffective disorder, prolonged QT, polysubstance abuse, tobacco use, recent hospitalization from 11/15-11/18 for COPD exacerbation and heart failure exacerbation who presents to the ED via EMS for worsening shortness of breath x3 days which acutely worsened this morning.  Upon arrival, EMS reported air saturation to be 79% and he was given albuterol and Atrovent, Solu-Medrol 125 mg and 2 g magnesium and route.  Placed on NRB.  Symptoms worse with ambulation and lying flat.  No chest pain.  States he has not used tobacco or cocaine in the past week.  Has a nonproductive cough.  Says that he initially feels a bit better after leaving the hospital but has been having worsening symptoms since.  Says he is feeling a bit better now compared to this a.m.  Says that he has been adhering to a low low-sodium diet.   ED Course: Afebrile, tachycardic, tachypneic, initially on 15 LPM via NRB but now on BiPAP. Notable Labs: Sodium 139, K3.3, BUN 16, creatinine 1.13, BNP 1075 (baseline is about 300-400), COVID-19 negative. Notable Imaging: CXR with interstitial opacities in the right mid and lower lung field concerning for either atelectasis, asymmetric edema or atypical/viral infection. Patient received albuterol continuously, doxycycline, Lasix was ordered but he refused, given Atrovent and steroids in route as above.  Per signout from the ED MD, the patient was initially tolerating 15 LPM but became acutely aggravated/angry at his nurse and then began tripoding and was placed on BiPAP and received Versed x1  Assessment & Plan:   Principal Problem:   Acute respiratory failure with hypoxia ([place]) Active Problems:    Schizoaffective disorder, bipolar type ([place])   Essential hypertension   Cocaine abuse with cocaine-induced mood disorder ([place])   Homelessness   QT prolongation   Acute on chronic combined systolic and diastolic CHF (congestive heart failure) ([place])   COPD with acute exacerbation ([facility])   Hypokalemia   Hyperlipidemia  1 acute hypoxic respiratory failure likely multifactorial secondary to acute systolic heart failure and probable COPD exacerbation Patient presented with new increased O2 needs with hypoxia with sats of 79%.  Patient noted to have required BiPAP on admission.  BiPAP discontinued patient placed on CPAP.  On nasal cannula.  Patient with complaints of worsening shortness of breath, patient noted to have some diffuse wheezing.  Patient also with a component of anxiety noted.  Patient was volume overloaded on presentation and has diuresed aggressively with diuretics.  Patient with a urine output of 2.750 L over the past 24 hours.  Patient is -8.824 L during this hospitalization.  Current weight of 195.6 pounds from 201.3 pounds.  Continue current IV Solu-Medrol taper, Pulmicort, scheduled duo nebs, Claritin, Flonase, Protonix.  Continue hydroxyzine 50 mg every 8 hours as needed.  Oral Lasix.  Strict I's and O's.  Daily weights.   2.  Acute on chronic systolic and diastolic heart failure Patient noted to have an elevated BNP of 1075.9.  Patient with no acute chest pain.  Troponin of 36.  Patient with recent 2D echo (09/26/2020) with EF of 40 to 45%, left ventricular global hypokinesis, mild concentric LVH, grade 2 diastolic dysfunction, moderately dilated left atrial size, mildly dilated right atrial size.  Patient recently hospitalized and discharged on 10/15/2020.  Patient stated he gets his paycheck once a month at the beginning of the month and as such unable to afford medications from recent discharge and did not have any medications to take at home.  Patient somewhat frustrated.  Patient  placed on Lasix 40 mg IV every 12 hours with excellent diuresis with a urine output of 2.750 L over the past 24 hours.  Patient is -8.824 L during this hospitalization.  Current weight of 195.6 pounds (10/28/2020) from 201.3 pounds (08/27/2020).  ).  Patient with abdominal pain BUN with clinical improvement with volume status.  Patient has been transitioned from IV Lasix to oral Lasix which we will continue.  Continue Imdur, hydralazine, Coreg.  Resume spironolactone.  Will need outpatient follow-up with PCP and cardiology.  Supportive care.   3.  Acute COPD exacerbation Patient with diffuse wheezing noted on examination.  With complaints of shortness of breath.  Discontinued oral prednisone and placed on IV Solu-Medrol 80 mg every 8 hours.  Decrease IV Solu-Medrol to 60 mg every 8 hours.  Continue Pulmicort, Claritin, Flonase, PPI, scheduled duo nebs.  CPAP nightly.  Will need outpatient follow-up with PCP.    4.  Prolonged QT Continue to hold QT prolongation medications.  Repeat EKG pending.Marland Kitchen  Keep potassium > 4, magnesium >2.  Follow.  5.  Schizoaffective disorder Patient seen in consultation by psychiatry who are recommending continuation of Depakote 500 mg twice daily, gabapentin 200 mg twice daily, valproic acid level.  Per psychiatry patient does not meet criteria for inpatient psychiatric admission.  Will need outpatient follow-up.  6.  Hypertension Stable.  Continue current regimen of hydralazine, Imdur, Coreg.  Resume home regimen spironolactone.  Continue to hold lisinopril.  Follow.  7.  Hyperlipidemia Statin.    8.  Polysubstance abuse UDS negative for cocaine.  Patient states has not used any cocaine since recent discharge.  Patient commended.  Follow.  9.  Hypokalemia Repleted.  Potassium at 4.0.  10.  Hyperglycemia Likely steroid-induced.  Hemoglobin A1c noted at 5.9 (09/09/2020).  Patient refused CBG this morning.  Be met obtained this afternoon with a glucose of 465.   Increase Lantus to 20 units daily.  Placed on NovoLog 6 units meal coverage.  Resistant sliding scale insulin.  Follow.  11.  History of alcohol use Continue the Ativan withdrawal protocol.   DVT prophylaxis: Lovenox Code Status: Full Family Communication: Updated patient.  No family at bedside. Disposition:   Status is: Inpatient    Dispo: The patient is from: Home ??  Homeless              Anticipated d/c is to: Home              Anticipated d/c date is: 2 to 3 days.              Patient currently with acute respiratory failure secondary to CHF exacerbation and COPD exacerbation, on IV steroids, nebulizer treatments.  Not stable for discharge.       Consultants:   Psychiatry: Ricky Ala, NP 10/26/2020  Procedures:   Chest x-ray 10/26/2020    Antimicrobials:   Doxycycline 10/28/2020   Subjective: Patient noted to be refusing telemetry.  Patient also noted to have refused labs this morning however in agreement to get labs this afternoon.  Sitting up on the side of the bed complaining of shortness of breath whenever CPAP has been taken off.  Complaining of low back  pain.  Complain of some sharp left chest pain.    Objective: Vitals:   10/28/20 1744 10/28/20 2110 10/28/20 2130 10/29/20 1033  BP: (!) 171/119  (!) 159/110 (!) 171/119  Pulse: (!) 109  (!) 105 (!) 102  Resp: '16  20 20  ' Temp: (!) 97.4 F (36.3 C)     TempSrc: Oral     SpO2: 99% 100% 95% 100%  Weight:      Height:        Intake/Output Summary (Last 24 hours) at 10/29/2020 1042 Last data filed at 10/29/2020 0500 Gross per 24 hour  Intake 1746 ml  Output 1500 ml  Net 246 ml   Filed Weights   10/27/20 0500 10/28/20 0531  Weight: 91.3 kg 88.7 kg    Examination:  General exam: On [place]. Respiratory system: Decreasing diffuse expiratory wheezing.  Some scattered coarse breath sounds.  No use of accessory muscles of respiration.  Speaking in full sentences.  Cardiovascular system: RRR no  murmurs rubs or gallops.  No JVD.  No lower extremity edema.   Gastrointestinal system: Abdomen is soft, nontender, nondistended, positive bowel sounds.  No rebound.  No guarding. Central nervous system: Alert and oriented. No focal neurological deficits. Extremities: Symmetric 5 x 5 power. Skin: No rashes, lesions or ulcers Psychiatry: Judgement and insight appear normal. Mood & affect appropriate.     Data Reviewed: I have personally reviewed following labs and imaging studies  CBC: Recent Labs  Lab 10/26/20 0732 10/27/20 0520 10/28/20 0622  WBC 7.6 15.1* 22.6*  HGB 12.4* 12.6* 13.6  HCT 38.5* 39.0 41.4  MCV 102.7* 102.1* 101.5*  PLT 235 248 449    Basic Metabolic Panel: Recent Labs  Lab 10/26/20 0732 10/27/20 0520 10/28/20 0622 10/28/20 1249  NA 139 139 138 134*  K 3.3* 4.7 4.4 4.2  CL 108 104 101 98  CO2 '22 23 23 ' 20*  GLUCOSE 90 185* 386* 466*  BUN 16 23* 22* 25*  CREATININE 1.13 1.26* 1.44* 1.53*  CALCIUM 7.8* 9.0 9.4 9.2  MG 2.5*  --   --   --     GFR: Estimated Creatinine Clearance: 62.2 mL/min (A) (by C-G formula based on SCr of 1.53 mg/dL (H)).  Liver Function Tests: No results for input(s): AST, ALT, ALKPHOS, BILITOT, PROT, ALBUMIN in the last 168 hours.  CBG: Recent Labs  Lab 10/27/20 2027 10/28/20 0739 10/28/20 1137 10/28/20 1650 10/28/20 2129  GLUCAP 375* 320* 421* 236* 278*     Recent Results (from the past 240 hour(s))  Resp Panel by RT-PCR (Flu A&B, Covid) Nasopharyngeal Swab     Status: None   Collection Time: 10/26/20  7:49 AM   Specimen: Nasopharyngeal Swab; Nasopharyngeal(NP) swabs in vial transport medium  Result Value Ref Range Status   SARS Coronavirus 2 by RT PCR NEGATIVE NEGATIVE Final    Comment: (NOTE) SARS-CoV-2 target nucleic acids are NOT DETECTED.  The SARS-CoV-2 RNA is generally detectable in upper respiratory specimens during the acute phase of infection. The lowest concentration of SARS-CoV-2 viral copies this  assay can detect is 138 copies/mL. A negative result does not preclude SARS-Cov-2 infection and should not be used as the sole basis for treatment or other patient management decisions. A negative result may occur with  improper specimen collection/handling, submission of specimen other than nasopharyngeal swab, presence of viral mutation(s) within the areas targeted by this assay, and inadequate number of viral copies(<138 copies/mL). A negative result must be combined with clinical observations,  patient history, and epidemiological information. The expected result is Negative.  Fact Sheet for Patients:  EntrepreneurPulse.com.au  Fact Sheet for Healthcare Providers:  IncredibleEmployment.be  This test is no t yet approved or cleared by the [place] FDA and  has been authorized for detection and/or diagnosis of SARS-CoV-2 by FDA under an Emergency Use Authorization (EUA). This EUA will remain  in effect (meaning this test can be used) for the duration of the COVID-19 declaration under Section 564(b)(1) of the Act, 21 U.S.C.section 360bbb-3(b)(1), unless the authorization is terminated  or revoked sooner.       Influenza A by PCR NEGATIVE NEGATIVE Final   Influenza B by PCR NEGATIVE NEGATIVE Final    Comment: (NOTE) The Xpert Xpress SARS-CoV-2/FLU/RSV plus assay is intended as an aid in the diagnosis of influenza from Nasopharyngeal swab specimens and should not be used as a sole basis for treatment. Nasal washings and aspirates are unacceptable for Xpert Xpress SARS-CoV-2/FLU/RSV testing.  Fact Sheet for Patients: EntrepreneurPulse.com.au  Fact Sheet for Healthcare Providers: IncredibleEmployment.be  This test is not yet approved or cleared by the [place] FDA and has been authorized for detection and/or diagnosis of SARS-CoV-2 by FDA under an Emergency Use Authorization (EUA). This EUA will  remain in effect (meaning this test can be used) for the duration of the COVID-19 declaration under Section 564(b)(1) of the Act, 21 U.S.C. section 360bbb-3(b)(1), unless the authorization is terminated or revoked.  Performed at [facility], [place] [address] [place], [place] [address]   Culture, Urine     Status: None   Collection Time: 10/27/20  8:14 AM   Specimen: Urine, Clean Catch  Result Value Ref Range Status   Specimen Description   Final    URINE, CLEAN CATCH Performed at [facility], [place] [address], [place], [place] [address]    Special Requests   Final    CATH Performed at [facility], [place] [address], [place], [place] [address]    Culture   Final    NO GROWTH Performed at [facility], [place] [address], [place], [place] [address]    Report Status 10/28/2020 FINAL  Final         Radiology Studies: No results found.      Scheduled Meds:  atorvastatin  20 mg Oral Daily   budesonide (PULMICORT) nebulizer solution  0.5 mg Nebulization BID   carvedilol  3.125 mg Oral BID WC   chlorhexidine  15 mL Mouth Rinse BID   clopidogrel  75 mg Oral Daily   divalproex  500 mg Oral BID   doxycycline  100 mg Oral Q12H   enoxaparin (LOVENOX) injection  40 mg Subcutaneous B76E   folic acid  1 mg Oral Daily   furosemide  40 mg Oral Daily   gabapentin  200 mg Oral BID   hydrALAZINE  50 mg Oral Q8H   insulin aspart  0-20 Units Subcutaneous TID WC   insulin glargine  20 Units Subcutaneous Daily   ipratropium-albuterol  3 mL Nebulization TID   isosorbide mononitrate  30 mg Oral Daily   loratadine  10 mg Oral Daily   LORazepam  0-4 mg Intravenous Q6H   Followed by   Derrill Memo ON 10/30/2020] LORazepam  0-4 mg Intravenous Q12H   magic mouthwash w/lidocaine  15 mL Oral QID   mouth rinse  15 mL Mouth Rinse q12n4p   methylPREDNISolone (SOLU-MEDROL) injection  60 mg Intravenous Q8H   multivitamin  with minerals  1 tablet Oral Daily   pantoprazole  40 mg Oral Q0600   thiamine  100 mg Oral Daily   Or   thiamine  100 mg Intravenous Daily   Continuous Infusions:   LOS: 3 days    Time spent: 40 minutes    Irine Seal, MD [facility]   To contact the attending provider between 7A-7P or the covering provider during after hours 7P-7A, please log into the web site [email or web address] and access using universal [place] password for that web site. If you do not have the password, please call the hospital operator.  10/29/2020, 10:42 AM

## 2020-10-29 NOTE — Progress Notes (Signed)
Received call from Central telemetry that the patient had removed his monitor.  I asked that it be put on hold for now because the patient is refusing to wear it.  Telemetry monitoring put on hold for now.

## 2020-10-29 NOTE — Progress Notes (Signed)
Notified by NS that the patient wanted to see his nurse.  Upon entering the room, I asked the patient what I could do for him.  He stated, "I don't need nothing."  I told him I would be back in about 15 minutes with his pain meds and he told me to send his NT.  I said, "I'm right here.  The NT is busy in another room. I'll be glad to get whatever you need."  He became agitated and said he didn't want me to do anything, to just send the NT, and pressed his call light.  I canceled it, again saying I'll be glad to do whatever you need. He continued pressing his call light, yelling at me to get out of his room and not come back.  When his light was answered, he stated he wanted to see the CN or the nursing supervisor.  He also stated he was "going down the hall to get what he wanted himself."  I reminded him that he was not to be walking the hall without wearing a mask.  He came out in the hall, walked to the nurse's station and demanded to see a supervisor. We asked him to return to his room, and, as he got back to his room, Melida Quitter, [facility] arrived to the department. He addressed the patient and the patient stated, "I didn't call for you!"  Carlynn Purl, asked, " you asked to speak to the supervisor, didn't you?"  The patient, becoming increasingly agitated, yelled, again, the he didn't call for the [facility] and the he wasn't the person he wanted to speak with.  Carlynn Purl stated again, that he had come d/t the patient's request, but the patient kept yelling for Carlynn Purl to leave his room, then began yelling for security.  Carlynn Purl told the patient that he would leave, and would make sure to send someone from leadership to see him today, as long as the patient returned to his room.  The patient finally returned to his room without incident.

## 2020-10-29 NOTE — Progress Notes (Signed)
RN at bedside to give pain medication. Pt also allows RN to administer solumedrol. Pt reports "you ain't worth nothing" to this RN. RN informs patient that I have assisted him multiple times throughout the day with his needs. Pt reports "I haven't gotten any food, where is my food." This RN reminds patient he has had a full breakfast tray, a full lunch tray, multiple ice creams, and multiple drinks this shift so far. RN reminds pt that he is on a fluid restriction, in which patient responds "I don't give a damn about a restriction, bring me something to drink." This RN informs patient he has water on his bedside table. Pt picked up the phone and called dietary service response and starts fussing at them about food again. RN encouraged pt to contact me with needs.

## 2020-10-29 NOTE — Progress Notes (Signed)
Pt refused CBG check this AM. Pt irritable and refusing care, states " I don't want anything done, just forget it".

## 2020-10-29 NOTE — Plan of Care (Signed)
°  Problem: Education: °Goal: Knowledge of General Education information will improve °Description: Including pain rating scale, medication(s)/side effects and non-pharmacologic comfort measures °Outcome: Progressing °  °Problem: Health Behavior/Discharge Planning: °Goal: Ability to manage health-related needs will improve °Outcome: Progressing °  °Problem: Clinical Measurements: °Goal: Ability to maintain clinical measurements within normal limits will improve °Outcome: Progressing °Goal: Will remain free from infection °Outcome: Progressing °Goal: Diagnostic test results will improve °Outcome: Progressing °Goal: Respiratory complications will improve °Outcome: Progressing °Goal: Cardiovascular complication will be avoided °Outcome: Progressing °  °Problem: Activity: °Goal: Risk for activity intolerance will decrease °Outcome: Progressing °  °Problem: Coping: °Goal: Level of anxiety will decrease °Outcome: Progressing °  °Problem: Elimination: °Goal: Will not experience complications related to bowel motility °Outcome: Progressing °  °Problem: Pain Managment: °Goal: General experience of comfort will improve °Outcome: Progressing °  °Problem: Safety: °Goal: Ability to remain free from injury will improve °Outcome: Progressing °  °Problem: Skin Integrity: °Goal: Risk for impaired skin integrity will decrease °Outcome: Progressing °  °

## 2020-10-29 NOTE — TOC Progression Note (Signed)
Transition of Care [facility]) - Progression Note    Patient Details  Name: [name] MRN: [id number] Date of Birth: December 21, 1965  [facility] CM/SW Contact  Geni Bers, RN Phone Number: 10/29/2020, 10:06 AM  Clinical Narrative:  Tried to talk to pt, who would not talk or say anything about his medical needs. Have reached out to some outreach resources, looking for assistance in helping this pt who continues to return to the hospital. Pt used [facility] program for this year, and will not keep his appointments for PCP. A call to [facility] was made to check on pt's follow-up appointment. Pt was a no show on Sept 22,21. A call was made to [facility], pt received medications in August, pt also has not picked any of his medications up from [facility] that was order since that time. In August it was a [facility] team member that picked pt's medications up.         Expected Discharge Plan and Services           Expected Discharge Date:  (unknown)                                     Social Determinants of Health (SDOH) Interventions    Readmission Risk Interventions Readmission Risk Prevention Plan 10/15/2020 10/05/2020 07/23/2020  Transportation Screening Complete Complete Complete  PCP or Specialist Appt within 3-5 Days - - Complete  HRI or Home Care Consult - - Complete  Social Work Consult for Recovery Care Planning/Counseling - - Complete  Palliative Care Screening - - Not Applicable  Medication Review [facility]) Complete Referral to Pharmacy Complete  PCP or Specialist appointment within 3-5 days of discharge Complete Complete -  HRI or Home Care Consult Complete Patient refused -  SW Recovery Care/Counseling Consult Complete Complete -  Palliative Care Screening Not Applicable Not Applicable -  Skilled Nursing Facility Not Applicable Not Applicable -  Some recent data might be hidden

## 2020-10-29 NOTE — Progress Notes (Signed)
Lab tech came to find RN and reported pt is refusing labs and wanting her to come back because he was "on the phone ordering food."   RN at bedside. Pt refused CBG.   0900-RN delivered breakfast tray with what appeared to be an omelet and 2 strawberry ice creams. Lab tech enters the room to attempt to draw labs a second time and pt says " I AM EATING . " Pt becomes verbally aggressive and begins refusing labs. RN will notify MD.

## 2020-10-30 DIAGNOSIS — I5043 Acute on chronic combined systolic (congestive) and diastolic (congestive) heart failure: Secondary | ICD-10-CM | POA: Diagnosis not present

## 2020-10-30 DIAGNOSIS — J9601 Acute respiratory failure with hypoxia: Secondary | ICD-10-CM | POA: Diagnosis not present

## 2020-10-30 DIAGNOSIS — F1414 Cocaine abuse with cocaine-induced mood disorder: Secondary | ICD-10-CM | POA: Diagnosis not present

## 2020-10-30 DIAGNOSIS — J441 Chronic obstructive pulmonary disease with (acute) exacerbation: Secondary | ICD-10-CM | POA: Diagnosis not present

## 2020-10-30 LAB — BASIC METABOLIC PANEL
Anion gap: 12 (ref 5–15)
BUN: 29 mg/dL — ABNORMAL HIGH (ref 6–20)
CO2: 25 mmol/L (ref 22–32)
Calcium: 8.8 mg/dL — ABNORMAL LOW (ref 8.9–10.3)
Chloride: 102 mmol/L (ref 98–111)
Creatinine, Ser: 1.27 mg/dL — ABNORMAL HIGH (ref 0.61–1.24)
GFR, Estimated: >60 mL/min (ref >60)
Glucose, Bld: 259 mg/dL — ABNORMAL HIGH (ref 70–99)
Potassium: 4.2 mmol/L (ref 3.5–5.1)
Sodium: 139 mmol/L (ref 135–145)

## 2020-10-30 LAB — CBC
HCT: 43 % (ref 39.0–52.0)
Hemoglobin: 14.2 g/dL (ref 13.0–17.0)
MCH: 33.3 pg (ref 26.0–34.0)
MCHC: 33 g/dL (ref 30.0–36.0)
MCV: 100.7 fL — ABNORMAL HIGH (ref 80.0–100.0)
Platelets: 258 10*3/uL (ref 150–400)
RBC: 4.27 MIL/uL (ref 4.22–5.81)
RDW: 15.7 % — ABNORMAL HIGH (ref 11.5–15.5)
WBC: 19.5 10*3/uL — ABNORMAL HIGH (ref 4.0–10.5)
nRBC: 0 % (ref 0.0–0.2)

## 2020-10-30 LAB — GLUCOSE, CAPILLARY
Glucose-Capillary: 373 mg/dL — ABNORMAL HIGH (ref 70–99)
Glucose-Capillary: 212 mg/dL — ABNORMAL HIGH (ref 70–99)
Glucose-Capillary: 184 mg/dL — ABNORMAL HIGH (ref 70–99)
Glucose-Capillary: 262 mg/dL — ABNORMAL HIGH (ref 70–99)

## 2020-10-30 LAB — MAGNESIUM: Magnesium: 1.9 mg/dL (ref 1.7–2.4)

## 2020-10-30 MED ORDER — METHOCARBAMOL 500 MG PO TABS
500.0000 mg | ORAL_TABLET | Freq: Three times a day (TID) | ORAL | Status: DC | PRN
  Administered 2020-10-30: 500 mg via ORAL
  Filled 2020-10-30: qty 1

## 2020-10-30 MED ORDER — DICYCLOMINE HCL 20 MG PO TABS
20.0000 mg | ORAL_TABLET | Freq: Four times a day (QID) | ORAL | Status: DC | PRN
  Filled 2020-10-30: qty 1

## 2020-10-30 MED ORDER — HYDRALAZINE HCL 25 MG PO TABS
25.0000 mg | ORAL_TABLET | Freq: Once | ORAL | Status: AC
  Administered 2020-10-30: 25 mg via ORAL
  Filled 2020-10-30: qty 1

## 2020-10-30 MED ORDER — CLONIDINE HCL 0.1 MG PO TABS
0.1000 mg | ORAL_TABLET | Freq: Four times a day (QID) | ORAL | Status: DC
  Administered 2020-10-30 – 2020-10-31 (×4): 0.1 mg via ORAL
  Filled 2020-10-30 (×3): qty 1

## 2020-10-30 MED ORDER — HYDRALAZINE HCL 50 MG PO TABS
75.0000 mg | ORAL_TABLET | Freq: Three times a day (TID) | ORAL | Status: DC
  Administered 2020-10-30 – 2020-10-31 (×3): 75 mg via ORAL
  Filled 2020-10-30 (×3): qty 1

## 2020-10-30 MED ORDER — CLONIDINE HCL 0.1 MG PO TABS
0.1000 mg | ORAL_TABLET | ORAL | Status: DC
  Filled 2020-10-30: qty 1

## 2020-10-30 MED ORDER — CLONIDINE HCL 0.1 MG PO TABS: 0.1000 mg | ORAL_TABLET | Freq: Every day | ORAL | Status: DC

## 2020-10-30 MED ORDER — NAPROXEN 250 MG PO TABS
500.0000 mg | ORAL_TABLET | Freq: Two times a day (BID) | ORAL | Status: DC | PRN
  Filled 2020-10-30: qty 2

## 2020-10-30 MED ORDER — LOPERAMIDE HCL 2 MG PO CAPS: 2.0000 mg | ORAL_CAPSULE | ORAL | Status: DC | PRN

## 2020-10-30 MED ORDER — METHYLPREDNISOLONE SODIUM SUCC 125 MG IJ SOLR
60.0000 mg | Freq: Two times a day (BID) | INTRAMUSCULAR | Status: DC
  Administered 2020-10-30 – 2020-10-31 (×2): 60 mg via INTRAVENOUS
  Filled 2020-10-30 (×2): qty 2

## 2020-10-30 MED ORDER — ONDANSETRON 4 MG PO TBDP: 4.0000 mg | ORAL_TABLET | Freq: Four times a day (QID) | ORAL | Status: DC | PRN

## 2020-10-30 NOTE — Progress Notes (Signed)
PROGRESS NOTE    [name]  HQI:[id number] DOB: 10-Apr-1966 DOA: 10/26/2020 PCP: Patient, No Pcp Per    Chief Complaint  Patient presents with  . Respiratory Distress    Brief Narrative:  HPI per Dr. Dairl Ponder This is a 54 year old male with past medical history of diabetes, HFrEF, COPD, schizoaffective disorder, prolonged QT, polysubstance abuse, tobacco use, recent hospitalization from 11/15-11/18 for COPD exacerbation and heart failure exacerbation who presents to the ED via EMS for worsening shortness of breath x3 days which acutely worsened this morning.  Upon arrival, EMS reported air saturation to be 79% and he was given albuterol and Atrovent, Solu-Medrol 125 mg and 2 g magnesium and route.  Placed on NRB.  Symptoms worse with ambulation and lying flat.  No chest pain.  States he has not used tobacco or cocaine in the past week.  Has a nonproductive cough.  Says that he initially feels a bit better after leaving the hospital but has been having worsening symptoms since.  Says he is feeling a bit better now compared to this a.m.  Says that he has been adhering to a low low-sodium diet.   ED Course: Afebrile, tachycardic, tachypneic, initially on 15 LPM via NRB but now on BiPAP. Notable Labs: Sodium 139, K3.3, BUN 16, creatinine 1.13, BNP 1075 (baseline is about 300-400), COVID-19 negative. Notable Imaging: CXR with interstitial opacities in the right mid and lower lung field concerning for either atelectasis, asymmetric edema or atypical/viral infection. Patient received albuterol continuously, doxycycline, Lasix was ordered but he refused, given Atrovent and steroids in route as above.  Per signout from the ED MD, the patient was initially tolerating 15 LPM but became acutely aggravated/angry at his nurse and then began tripoding and was placed on BiPAP and received Versed x1  Assessment & Plan:   Principal Problem:   Acute respiratory failure with hypoxia (HCC) Active Problems:    Schizoaffective disorder, bipolar type (HCC)   Essential hypertension   Cocaine abuse with cocaine-induced mood disorder (HCC)   Homelessness   QT prolongation   Acute on chronic combined systolic and diastolic CHF (congestive heart failure) (HCC)   COPD with acute exacerbation (HCC)   Hypokalemia   Hyperlipidemia  1 acute hypoxic respiratory failure likely multifactorial secondary to acute systolic heart failure and probable COPD exacerbation Patient presented with new increased O2 needs with hypoxia with sats of 79%.  Patient noted to have required BiPAP on admission.  BiPAP discontinued patient placed on CPAP.  On nasal cannula.  Patient with complaints of worsening shortness of breath, patient noted to have some diffuse wheezing.  Patient also with a component of anxiety noted.  Patient was volume overloaded on presentation and has diuresed aggressively with diuretics.  Patient with a urine output of  L over the past 24 hours.  Patient is - 11.54 L during this hospitalization.  Current weight of 195.6 pounds from 201.3 pounds.  Continue current IV Solu-Medrol taper, Pulmicort, scheduled duo nebs, Claritin, Flonase, Protonix, hydroxyzine, Lasix.  Strict I's and O's.  Daily weights.  2.  Acute on chronic systolic and diastolic heart failure Patient noted to have an elevated BNP of 1075.9.  Patient with no acute chest pain.  Troponin of 36.  Patient with recent 2D echo (09/26/2020) with EF of 40 to 45%, left ventricular global hypokinesis, mild concentric LVH, grade 2 diastolic dysfunction, moderately dilated left atrial size, mildly dilated right atrial size. Patient recently hospitalized and discharged on 10/15/2020.  Patient stated he  gets his paycheck once a month at the beginning of the month and as such unable to afford medications from recent discharge and did not have any medications to take at home.  Patient somewhat frustrated.  Patient placed on Lasix 40 mg IV every 12 hours and transition  to oral Lasix 40 mg twice daily with excellent diuresis with a urine output of 3.1 L over the past 24 hours.  Patient is - 11.54 L during this hospitalization.  Current weight of 204.3 pounds from 195.6 pounds (10/28/2020) from 201.3 pounds (08/27/2020).  ).  Patient with abdominal pain BUN with clinical improvement with volume status. Continue Imdur, hydralazine, Coreg, spironolactone.  Will need outpatient follow-up with PCP and cardiology.  Supportive care.   3.  Acute COPD exacerbation Patient with diffuse wheezing noted on examination.  With complaints of shortness of breath.  Discontinued oral prednisone and placed on IV Solu-Medrol 80 mg every 8 hours.  Continue IV Solu-Medrol taper, Pulmicort, Claritin, Flonase, PPI, scheduled duo nebs.  CPAP nightly.  Will need outpatient follow-up with PCP.   4.  Prolonged QT Continue to hold QT prolongation medications.  Repeat EKG pending.. Potassium of 4.2.  Magnesium of 1.9.  Follow.   5.  Schizoaffective disorder Patient seen in consultation by psychiatry who are recommending continuation of Depakote 500 mg twice daily, gabapentin 200 mg twice daily, valproic acid level.  Per psychiatry patient does not meet criteria for inpatient psychiatric admission.  Will need outpatient follow-up.  6.  Hypertension Blood pressure significantly elevated this morning of 96/128. Increase hydralazine to 75 mg three times daily. Continue Imdur, Coreg, spironolactone.,  Spironolactone.  Continue to hold lisinopril.  Patient will be started on a clonidine detox protocol.  Follow.    7.  Hyperlipidemia Statin.    8.  Polysubstance abuse/?? Withdrawal UDS negative for cocaine.  Patient states has not used any cocaine since recent discharge.  Patient commended.  Patient with concerns for possible withdrawal as patient significantly restless. Place on the clonidine detox protocol. Follow.  9.  Hypokalemia Repleted. Potassium of 4.2.  10.  Hyperglycemia Likely  steroid-induced.  Hemoglobin A1c noted at 5.9 (09/09/2020). CBG of 373 this morning. Continue Lantus 20 units daily, meal coverage NovoLog 6 units three times daily with meals. Resistant sliding scale insulin. Follow.  11.  History of alcohol use Continue Ativan withdrawal protocol.   DVT prophylaxis: Lovenox Code Status: Full Family Communication: Updated patient.  No family at bedside. Disposition:   Status is: Inpatient    Dispo: The patient is from: Home ??  Homeless              Anticipated d/c is to: Home              Anticipated d/c date is: 2 to 3 days.              Patient currently with acute respiratory failure secondary to CHF exacerbation and COPD exacerbation, on IV steroids, nebulizer treatments.  Not stable for discharge.       Consultants:   Psychiatry: Hillery Jacks, NP 10/26/2020  Procedures:   Chest x-ray 10/26/2020    Antimicrobials:   Doxycycline 10/28/2020   Subjective: Patient laying in bed complaining that his nose is burning. Seems very restless and restless in the bed. States some improvement with his shortness of breath. Denies any chest pain. Noted to have elevated blood pressure of 186/128 this morning. Noted to be refusing telemetry.   Objective: Vitals:  10/29/20 2043 10/30/20 0500 10/30/20 0647 10/30/20 0826  BP:   (!) 187/121 (!) 186/128  Pulse:   91   Resp:   16   Temp:      TempSrc:      SpO2: 97%  100%   Weight:  92.7 kg    Height:        Intake/Output Summary (Last 24 hours) at 10/30/2020 1202 Last data filed at 10/30/2020 1022 Gross per 24 hour  Intake 1080 ml  Output 3800 ml  Net -2720 ml   Filed Weights   10/27/20 0500 10/28/20 0531 10/30/20 0500  Weight: 91.3 kg 88.7 kg 92.7 kg    Examination:  General exam:. Restless. Respiratory system: Decreasing diffuse expiratory wheezing. No crackles noted. No use of accessory muscles of respiration. Speaking in full sentences Cardiovascular system: Regular rate  rhythm no murmurs rubs or gallops. No JVD. No lower extremity edema. Gastrointestinal system: Abdomen is soft, nontender, nondistended, positive bowel sounds. No rebound. No guarding. Central nervous system: Alert and oriented. No focal neurological deficits. Extremities: Symmetric 5 x 5 power. Skin: No rashes, lesions or ulcers Psychiatry: Judgement and insight appear normal. Mood & affect appropriate.     Data Reviewed: I have personally reviewed following labs and imaging studies  CBC: Recent Labs  Lab 10/26/20 0732 10/27/20 0520 10/28/20 0622 10/29/20 1300 10/30/20 0544  WBC 7.6 15.1* 22.6* 20.7* 19.5*  HGB 12.4* 12.6* 13.6 13.7 14.2  HCT 38.5* 39.0 41.4 41.7 43.0  MCV 102.7* 102.1* 101.5* 102.5* 100.7*  PLT 235 248 263 242 258    Basic Metabolic Panel: Recent Labs  Lab 10/26/20 0732 10/26/20 0732 10/27/20 0520 10/28/20 0622 10/28/20 1249 10/29/20 1300 10/30/20 0544  NA 139   < > 139 138 134* 137 139  K 3.3*   < > 4.7 4.4 4.2 4.0 4.2  CL 108   < > 104 101 98 99 102  CO2 22   < > 23 23 20* 24 25  GLUCOSE 90   < > 185* 386* 466* 465* 259*  BUN 16   < > 23* 22* 25* 31* 29*  CREATININE 1.13   < > 1.26* 1.44* 1.53* 1.38* 1.27*  CALCIUM 7.8*   < > 9.0 9.4 9.2 8.9 8.8*  MG 2.5*  --   --   --   --  1.8 1.9   < > = values in this interval not displayed.    GFR: Estimated Creatinine Clearance: 76.5 mL/min (A) (by C-G formula based on SCr of 1.27 mg/dL (H)).  Liver Function Tests: No results for input(s): AST, ALT, ALKPHOS, BILITOT, PROT, ALBUMIN in the last 168 hours.  CBG: Recent Labs  Lab 10/28/20 1137 10/28/20 1650 10/28/20 2129 10/29/20 2026 10/30/20 0743  GLUCAP 421* 236* 278* 271* 373*     Recent Results (from the past 240 hour(s))  Resp Panel by RT-PCR (Flu A&B, Covid) Nasopharyngeal Swab     Status: None   Collection Time: 10/26/20  7:49 AM   Specimen: Nasopharyngeal Swab; Nasopharyngeal(NP) swabs in vial transport medium  Result Value Ref Range  Status   SARS Coronavirus 2 by RT PCR NEGATIVE NEGATIVE Final    Comment: (NOTE) SARS-CoV-2 target nucleic acids are NOT DETECTED.  The SARS-CoV-2 RNA is generally detectable in upper respiratory specimens during the acute phase of infection. The lowest concentration of SARS-CoV-2 viral copies this assay can detect is 138 copies/mL. A negative result does not preclude SARS-Cov-2 infection and should not be used as  the sole basis for treatment or other patient management decisions. A negative result may occur with  improper specimen collection/handling, submission of specimen other than nasopharyngeal swab, presence of viral mutation(s) within the areas targeted by this assay, and inadequate number of viral copies(<138 copies/mL). A negative result must be combined with clinical observations, patient history, and epidemiological information. The expected result is Negative.  Fact Sheet for Patients:  BloggerCourse.com  Fact Sheet for Healthcare Providers:  SeriousBroker.it  This test is no t yet approved or cleared by the [place] FDA and  has been authorized for detection and/or diagnosis of SARS-CoV-2 by FDA under an Emergency Use Authorization (EUA). This EUA will remain  in effect (meaning this test can be used) for the duration of the COVID-19 declaration under Section 564(b)(1) of the Act, 21 U.S.C.section 360bbb-3(b)(1), unless the authorization is terminated  or revoked sooner.       Influenza A by PCR NEGATIVE NEGATIVE Final   Influenza B by PCR NEGATIVE NEGATIVE Final    Comment: (NOTE) The Xpert Xpress SARS-CoV-2/FLU/RSV plus assay is intended as an aid in the diagnosis of influenza from Nasopharyngeal swab specimens and should not be used as a sole basis for treatment. Nasal washings and aspirates are unacceptable for Xpert Xpress SARS-CoV-2/FLU/RSV testing.  Fact Sheet for  Patients: BloggerCourse.com  Fact Sheet for Healthcare Providers: SeriousBroker.it  This test is not yet approved or cleared by the [place] FDA and has been authorized for detection and/or diagnosis of SARS-CoV-2 by FDA under an Emergency Use Authorization (EUA). This EUA will remain in effect (meaning this test can be used) for the duration of the COVID-19 declaration under Section 564(b)(1) of the Act, 21 U.S.C. section 360bbb-3(b)(1), unless the authorization is terminated or revoked.  Performed at [facility], [address] [place], [name] [address]   Culture, Urine     Status: None   Collection Time: 10/27/20  8:14 AM   Specimen: Urine, Clean Catch  Result Value Ref Range Status   Specimen Description   Final    URINE, CLEAN CATCH Performed at [facility], [address] [place], [name] [address]    Special Requests   Final    CATH Performed at [facility], [address] [place], [name] [address]    Culture   Final    NO GROWTH Performed at [facility], [address], [place], [name] [address]    Report Status 10/28/2020 FINAL  Final         Radiology Studies: DG CHEST PORT 1 VIEW  Result Date: 10/29/2020 CLINICAL DATA:  Shortness of breath.  Hypoxia. EXAM: PORTABLE CHEST 1 VIEW COMPARISON:  10/26/2020 FINDINGS: Mild enlargement of the cardiopericardial silhouette. The lungs appear clear. No blunting of the costophrenic angles. No significant bony abnormality observed. IMPRESSION: 1. Mild enlargement of the cardiopericardial silhouette, without edema. Electronically Signed   By: Gaylyn Rong M.D.   On: 10/29/2020 11:59        Scheduled Meds: . atorvastatin  20 mg Oral Daily  . budesonide (PULMICORT) nebulizer solution  0.5 mg Nebulization BID  . carvedilol  3.125 mg Oral BID WC  . chlorhexidine  15 mL Mouth Rinse BID  .  cloNIDine  0.1 mg Oral QID   Followed by  . [START ON 11/01/2020] cloNIDine  0.1 mg Oral BH-qamhs   Followed by  . [START ON 11/04/2020] cloNIDine  0.1 mg Oral QAC breakfast  . clopidogrel  75 mg Oral Daily  . divalproex  500 mg Oral BID  . doxycycline  100 mg Oral Q12H  . enoxaparin (LOVENOX) injection  40 mg Subcutaneous Q24H  . folic acid  1 mg Oral Daily  . furosemide  40 mg Oral Daily  . gabapentin  200 mg Oral BID  . hydrALAZINE  75 mg Oral Q8H  . insulin aspart  0-20 Units Subcutaneous TID WC  . insulin aspart  6 Units Subcutaneous TID WC  . insulin glargine  20 Units Subcutaneous Daily  . ipratropium-albuterol  3 mL Nebulization TID  . isosorbide mononitrate  30 mg Oral Daily  . loratadine  10 mg Oral Daily  . LORazepam  0-4 mg Intravenous Q6H   Followed by  . LORazepam  0-4 mg Intravenous Q12H  . magic mouthwash w/lidocaine  15 mL Oral QID  . mouth rinse  15 mL Mouth Rinse q12n4p  . methylPREDNISolone (SOLU-MEDROL) injection  60 mg Intravenous Q12H  . multivitamin with minerals  1 tablet Oral Daily  . pantoprazole  40 mg Oral Q0600  . spironolactone  12.5 mg Oral Daily  . thiamine  100 mg Oral Daily   Or  . thiamine  100 mg Intravenous Daily   Continuous Infusions:   LOS: 4 days    Time spent: 40 minutes    Ramiro Harvest, MD [facility]   To contact the attending provider between 7A-7P or the covering provider during after hours 7P-7A, please log into the web site [email or web address] and access using universal [place] password for that web site. If you do not have the password, please call the hospital operator.  10/30/2020, 12:02 PM

## 2020-10-30 NOTE — TOC Progression Note (Addendum)
Transition of Care [facility]) - Progression Note    Patient Details  Name: [name] MRN: [id number] Date of Birth: 1966-02-26  [facility] CM/SW Contact  Geni Bers, RN Phone Number: 10/30/2020, 11:42 AM  Clinical Narrative:    Continue to try to assist this pt. Spoke with pt who agreed to go to [facility] for appt and pick up medications. TOC will continue to follow for discharge.   Expected Discharge Plan: Home/Self Care Barriers to Discharge: No Barriers Identified  Expected Discharge Plan and Services Expected Discharge Plan: Home/Self Care         Expected Discharge Date:  (unknown)                                     Social Determinants of Health (SDOH) Interventions    Readmission Risk Interventions Readmission Risk Prevention Plan 10/15/2020 10/05/2020 07/23/2020  Transportation Screening Complete Complete Complete  PCP or Specialist Appt within 3-5 Days - - Complete  HRI or Home Care Consult - - Complete  Social Work Consult for Recovery Care Planning/Counseling - - Complete  Palliative Care Screening - - Not Applicable  Medication Review [employer]) Complete Referral to Pharmacy Complete  PCP or Specialist appointment within 3-5 days of discharge Complete Complete -  HRI or Home Care Consult Complete Patient refused -  SW Recovery Care/Counseling Consult Complete Complete -  Palliative Care Screening Not Applicable Not Applicable -  Skilled Nursing Facility Not Applicable Not Applicable -  Some recent data might be hidden

## 2020-10-30 NOTE — Progress Notes (Signed)
Called by CN concerning patient being seen by staff member with white pills in bag and patients gait unsteady. Security and I went to beside. I talked with patient and verbalized concerns with patients safety taking pills not being given by RN and ordered by MD. Patient stated "search me and my room, I have nothing to hide." Patient refused to state if he took any pills or had any pills. After consent by patient, the room and patient were searched. Four white pills and orange lighter found in room. Security disposed of pills, MD was notified. Patient instructed on importance of not taking any medications not prescribed by MD and given by RN. Patient refused to verbalize understanding.

## 2020-10-30 NOTE — Plan of Care (Signed)
°  Problem: Education: °Goal: Knowledge of General Education information will improve °Description: Including pain rating scale, medication(s)/side effects and non-pharmacologic comfort measures °Outcome: Progressing °  °Problem: Health Behavior/Discharge Planning: °Goal: Ability to manage health-related needs will improve °Outcome: Progressing °  °Problem: Clinical Measurements: °Goal: Ability to maintain clinical measurements within normal limits will improve °Outcome: Progressing °Goal: Will remain free from infection °Outcome: Progressing °Goal: Diagnostic test results will improve °Outcome: Progressing °Goal: Respiratory complications will improve °Outcome: Progressing °Goal: Cardiovascular complication will be avoided °Outcome: Progressing °  °Problem: Activity: °Goal: Risk for activity intolerance will decrease °Outcome: Progressing °  °Problem: Coping: °Goal: Level of anxiety will decrease °Outcome: Progressing °  °Problem: Elimination: °Goal: Will not experience complications related to bowel motility °Outcome: Progressing °  °Problem: Pain Managment: °Goal: General experience of comfort will improve °Outcome: Progressing °  °Problem: Safety: °Goal: Ability to remain free from injury will improve °Outcome: Progressing °  °Problem: Skin Integrity: °Goal: Risk for impaired skin integrity will decrease °Outcome: Progressing °  °

## 2020-10-30 NOTE — Progress Notes (Signed)
RT arrived to administer scheduled breathing treatment. Patient was found walking around in the room, stumbling, and saying he could not find a "black ziplock bag". Patient was redirected to the bed and unsteady in his ambulation, grabbing onto the bed and walls. Appeared he would fall several times. During this encounter, he kept asking for the "zip code to the liquor store". Patient complied with getting in bed and understood when concerns of a potential fall were expressed. Additionally, there was a small, clear bag of white pills on the bedside table; approx 15-20 pills in the bag without a label of what medication was in there. RT stepped out within visual range of patient to ensure safety and Charge RN made aware of findings. She came in to see patient and he stated the pills were steroids or muscle relaxers. The patient began to get mildly agitated after a few questions wondering why he was being questioned so much; refused to let charge RN take pills from the room.

## 2020-10-30 NOTE — Progress Notes (Signed)
1515 - this RN walking down hall and RT and Charge RN stopped me to let me know they found a clear bag with "several little white pills" inside. Pt refused to give the bag of pills to the charge RN, Aundra Millet. Upon finding out this information, this RN obtained VS's on patient. Pt was eating strawberry ice cream at this time.   AC and security called to come to bedside and search patients room. AC and security found 4 small white pills with the letter "M" on one side and "05 52" on the other side. An orange lighter was also found and taken to lock up in locked security bin.   1540- This RN notified Dr. Janee Morn of finding 4 pills and security search. Dr. Janee Morn made aware that pt was stumbling around in the room per RT, whom was afraid of him falling. Dr. Janee Morn ordered telesitter to be with patient. This RN notified Dr. Janee Morn of VS's that were obtained prior to security searching patients room.

## 2020-10-31 ENCOUNTER — Inpatient Hospital Stay (HOSPITAL_COMMUNITY): Payer: Medicaid - Out of State

## 2020-10-31 DIAGNOSIS — J9601 Acute respiratory failure with hypoxia: Secondary | ICD-10-CM | POA: Diagnosis not present

## 2020-10-31 LAB — CBC
HCT: 43 % (ref 39.0–52.0)
Hemoglobin: 14.3 g/dL (ref 13.0–17.0)
MCH: 32.9 pg (ref 26.0–34.0)
MCHC: 33.3 g/dL (ref 30.0–36.0)
MCV: 98.9 fL (ref 80.0–100.0)
Platelets: 266 10*3/uL (ref 150–400)
RBC: 4.35 MIL/uL (ref 4.22–5.81)
RDW: 15.3 % (ref 11.5–15.5)
WBC: 17 10*3/uL — ABNORMAL HIGH (ref 4.0–10.5)
nRBC: 0.1 % (ref 0.0–0.2)

## 2020-10-31 LAB — BASIC METABOLIC PANEL
Anion gap: 12 (ref 5–15)
BUN: 28 mg/dL — ABNORMAL HIGH (ref 6–20)
CO2: 28 mmol/L (ref 22–32)
Calcium: 8.8 mg/dL — ABNORMAL LOW (ref 8.9–10.3)
Chloride: 98 mmol/L (ref 98–111)
Creatinine, Ser: 1.19 mg/dL (ref 0.61–1.24)
GFR, Estimated: >60 mL/min (ref >60)
Glucose, Bld: 250 mg/dL — ABNORMAL HIGH (ref 70–99)
Potassium: 4 mmol/L (ref 3.5–5.1)
Sodium: 138 mmol/L (ref 135–145)

## 2020-10-31 LAB — GLUCOSE, CAPILLARY
Glucose-Capillary: 232 mg/dL — ABNORMAL HIGH (ref 70–99)
Glucose-Capillary: 296 mg/dL — ABNORMAL HIGH (ref 70–99)

## 2020-10-31 LAB — MAGNESIUM: Magnesium: 2.1 mg/dL (ref 1.7–2.4)

## 2020-10-31 IMAGING — DX DG LUMBAR SPINE 1V
1 series · 1 of 1 positions shown · non-contrast
Comparison: None.

CLINICAL DATA: Respiratory distress.

EXAM:
LUMBAR SPINE - 1 VIEW

[l-spine ap]
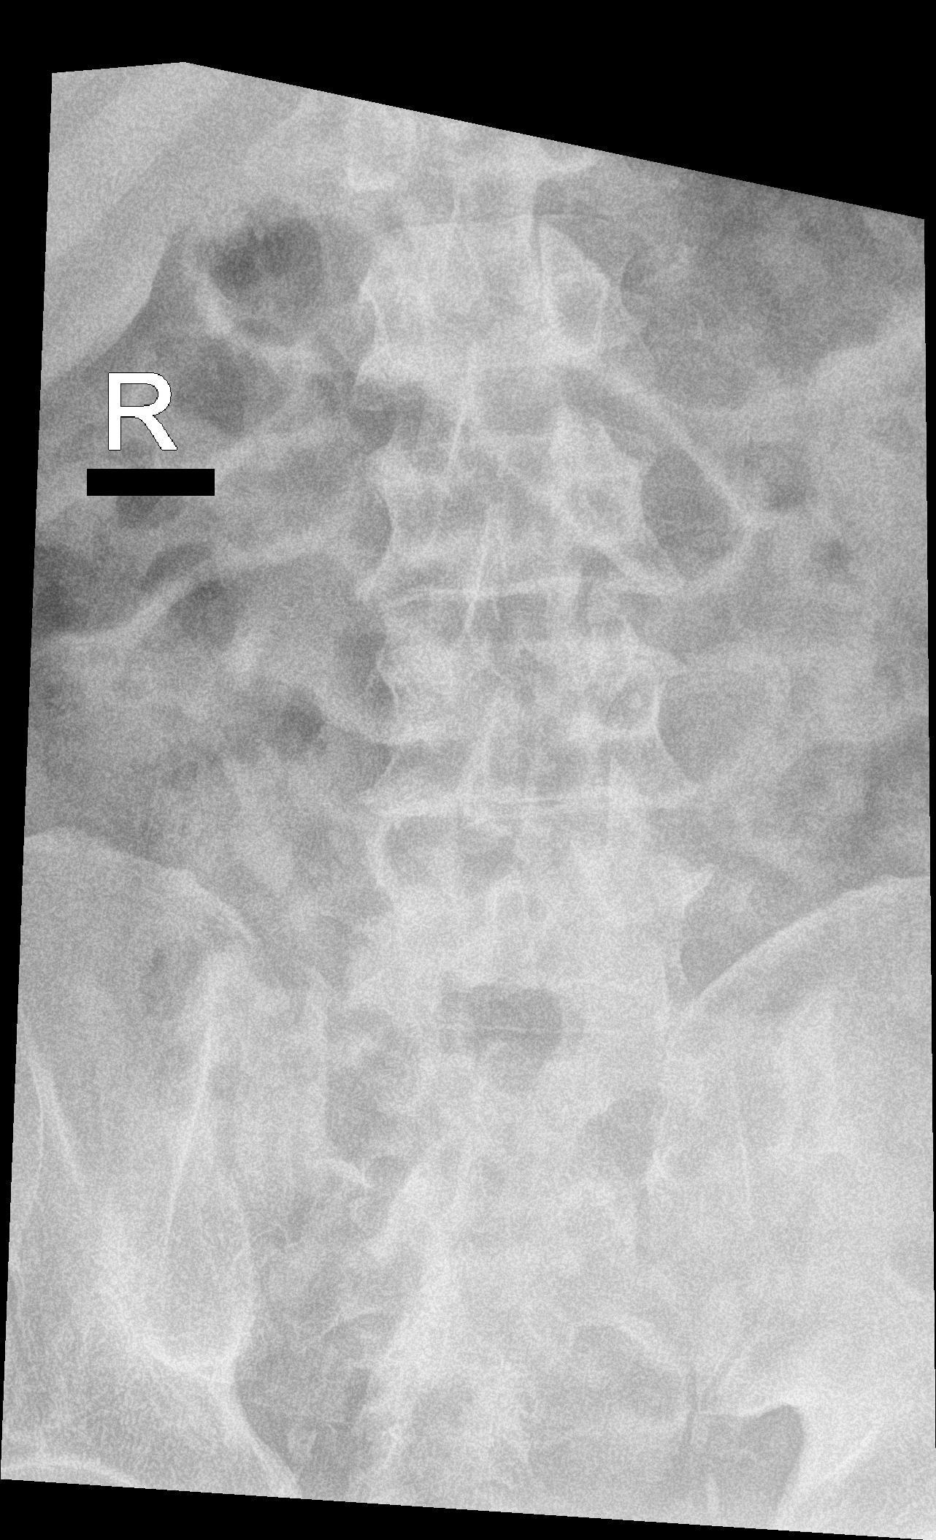

[1 of 1 positions shown; findings below may reference images not displayed]

FINDINGS: AP projection only was obtained. No definite fracture is seen
involving the visualized lumbar spine.
IMPRESSION: No definite abnormality seen in the visualized lumbar spine.

## 2020-10-31 IMAGING — DX DG CHEST 1V PORT
1 series · 1 of 1 positions shown · non-contrast
Comparison: [DATE].

CLINICAL DATA: Respiratory distress.

EXAM:
PORTABLE CHEST 1 VIEW

[chest ap]
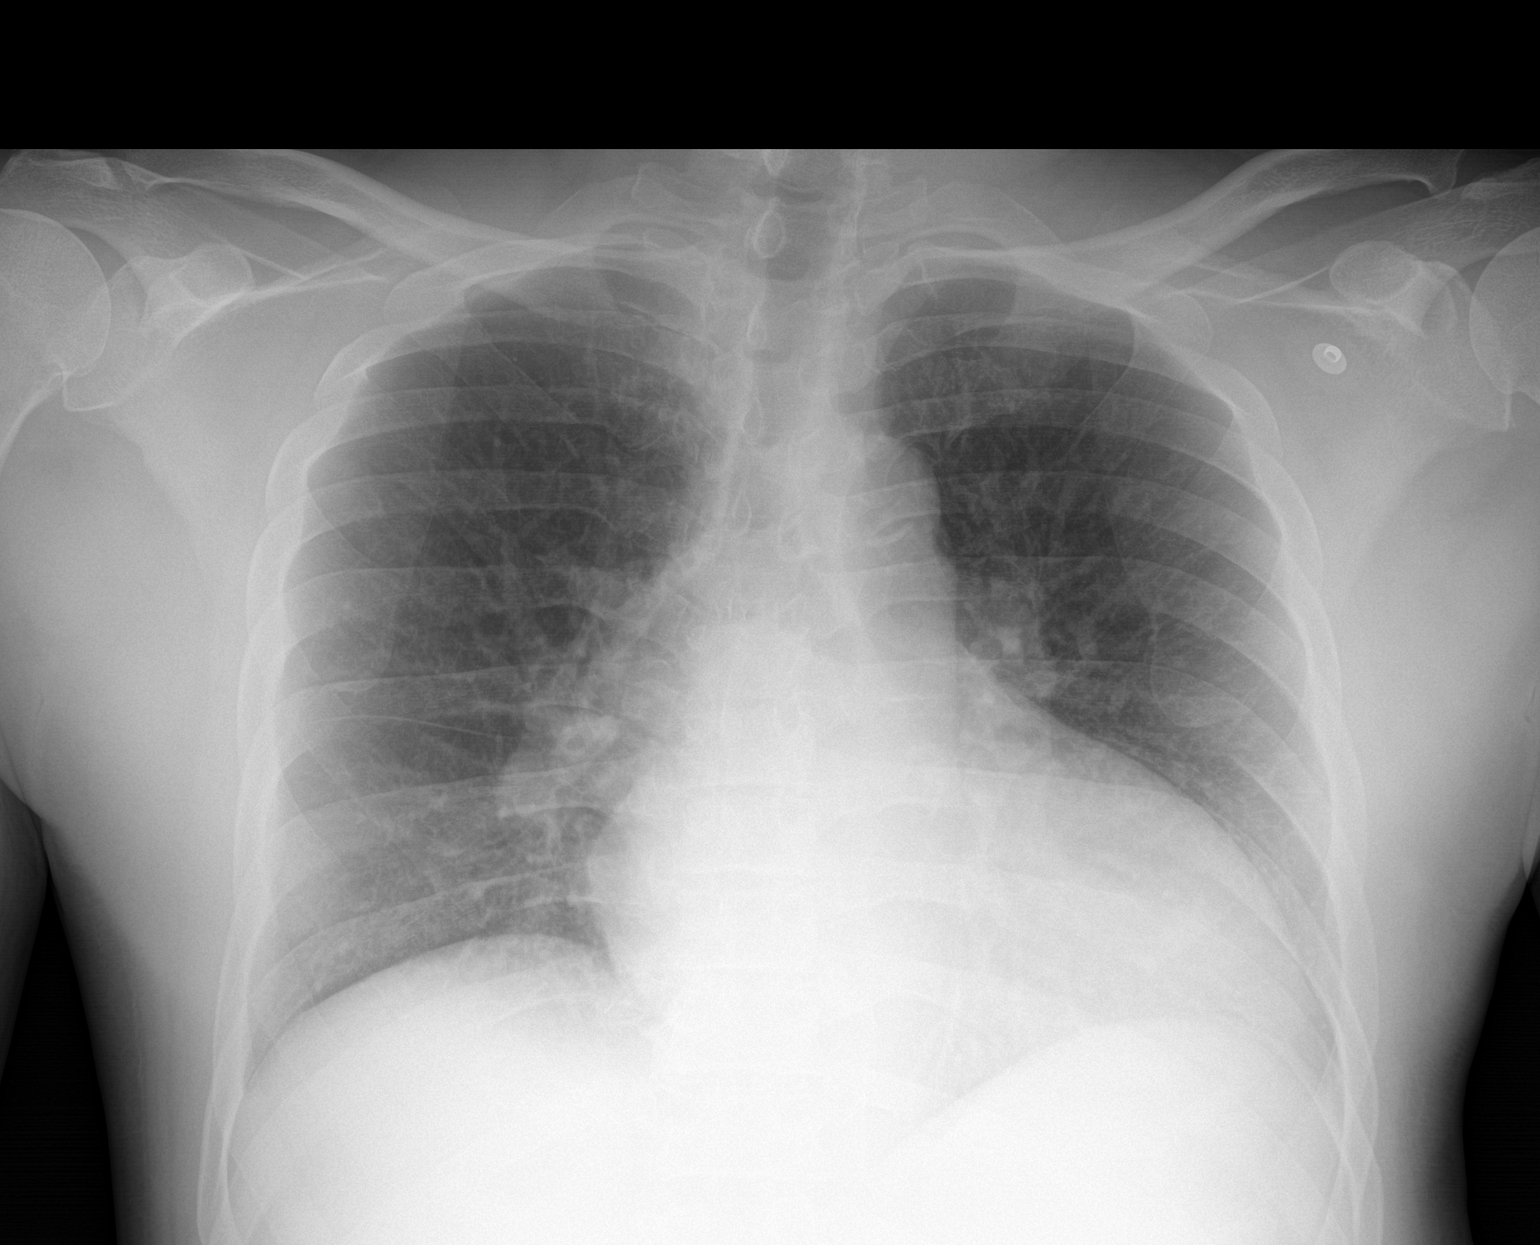

[1 of 1 positions shown; findings below may reference images not displayed]

FINDINGS: Stable cardiomegaly. No pneumothorax or pleural effusion is noted.
Both lungs are clear. The visualized skeletal structures are
unremarkable.
IMPRESSION: No active disease.

## 2020-10-31 MED ORDER — IPRATROPIUM-ALBUTEROL 0.5-2.5 (3) MG/3ML IN SOLN: 3.0000 mL | Freq: Two times a day (BID) | RESPIRATORY_TRACT | Status: DC

## 2020-10-31 MED ORDER — GABAPENTIN 300 MG PO CAPS: 300.0000 mg | ORAL_CAPSULE | Freq: Three times a day (TID) | ORAL | Status: DC

## 2020-10-31 MED ORDER — FUROSEMIDE 10 MG/ML IJ SOLN
40.0000 mg | Freq: Every day | INTRAMUSCULAR | Status: DC
  Administered 2020-10-31: 40 mg via INTRAVENOUS
  Filled 2020-10-31: qty 4

## 2020-10-31 MED ORDER — OXYCODONE HCL 5 MG PO TABS: 5.0000 mg | ORAL_TABLET | ORAL | Status: DC | PRN

## 2020-10-31 MED ORDER — METHOCARBAMOL 500 MG PO TABS: 500.0000 mg | ORAL_TABLET | Freq: Three times a day (TID) | ORAL | Status: DC

## 2020-10-31 MED ORDER — PREDNISONE 50 MG PO TABS
50.0000 mg | ORAL_TABLET | Freq: Every day | ORAL | Status: DC
  Filled 2020-10-31: qty 1

## 2020-10-31 MED ORDER — CLONIDINE HCL 0.1 MG PO TABS: 0.1000 mg | ORAL_TABLET | Freq: Three times a day (TID) | ORAL | Status: DC

## 2020-10-31 MED ORDER — ISOSORBIDE MONONITRATE ER 60 MG PO TB24: 60.0000 mg | ORAL_TABLET | Freq: Every day | ORAL | Status: DC

## 2020-10-31 MED ORDER — DOXYCYCLINE HYCLATE 100 MG PO TABS: 100.0000 mg | ORAL_TABLET | Freq: Two times a day (BID) | ORAL | Status: DC

## 2020-10-31 MED ORDER — OXYCODONE-ACETAMINOPHEN 5-325 MG PO TABS
1.0000 | ORAL_TABLET | Freq: Four times a day (QID) | ORAL | Status: DC | PRN
  Filled 2020-10-31: qty 1

## 2020-10-31 MED ORDER — HYDRALAZINE HCL 50 MG PO TABS
100.0000 mg | ORAL_TABLET | Freq: Three times a day (TID) | ORAL | Status: DC
  Administered 2020-10-31: 100 mg via ORAL
  Filled 2020-10-31: qty 2

## 2020-10-31 MED ORDER — ISOSORBIDE MONONITRATE ER 30 MG PO TB24
30.0000 mg | ORAL_TABLET | Freq: Once | ORAL | Status: AC
  Administered 2020-10-31: 30 mg via ORAL
  Filled 2020-10-31: qty 1

## 2020-10-31 NOTE — Progress Notes (Signed)
RN at bedside, pt's room is in a mess and he has thrown ice cream in the floor. RN told pt I was bringing housekeeping in to clean. Pt responded "there aint no maids and butlers up here." RN educated pt that we do have housekeeping but for the last 2 days he has not let them inside his room and they are not "maids." Pt said "ok whatever, come with them to clean." RN agreed to be with housekeeping when they cleaned his room.

## 2020-10-31 NOTE — Discharge Summary (Signed)
[facility] Discharge Summary   Patient: [name] IWP:[id number]   PCP: Patient, No Pcp Per DOB: 07-30-1966   Date of admission: 10/26/2020   Date of discharge: 10/31/2020    Discharge Diagnoses: patient left AMA Principal Problem:   Acute respiratory failure with hypoxia (HCC) Active Problems:   Schizoaffective disorder, bipolar type (HCC)   Essential hypertension   Cocaine abuse with cocaine-induced mood disorder (HCC)   Homelessness   QT prolongation   Acute on chronic combined systolic and diastolic CHF (congestive heart failure) (HCC)   COPD with acute exacerbation (HCC)   Hypokalemia   Hyperlipidemia  Discharge Condition: fair  History of present illness: As per the H and P dictated on admission, "This is a 54 year old male with past medical history of diabetes, HFrEF, COPD, schizoaffective disorder, prolonged QT, polysubstance abuse, tobacco use, recent hospitalization from 11/15-11/18 for COPD exacerbation and heart failure exacerbation who presents to the ED via EMS for worsening shortness of breath x3 days which acutely worsened this morning.  Upon arrival, EMS reported air saturation to be 79% and he was given albuterol and Atrovent, Solu-Medrol 125 mg and 2 g magnesium and route.  Placed on NRB.  Symptoms worse with ambulation and lying flat.  No chest pain.  States he has not used tobacco or cocaine in the past week.  Has a nonproductive cough.  Says that he initially feels a bit better after leaving the hospital but has been having worsening symptoms since.  Says he is feeling a bit better now compared to this a.m.  Says that he has been adhering to a low low-sodium diet."  Hospital Course:  Summary of his active problems in the hospital is as following. 1. Acute hypoxic respiratory failure likely multifactorial secondary to acute systolic heart failure and probable COPD exacerbation Presents with hypoxia, was saturation 79% on room air. Initially started on  BiPAP. Later on transitioned to CPAP. Oxygen requirement improved to room air at rest. Started on IV diuresis. -11 L so far. Was also on IV Solu-Medrol. Nebulization. Doxycycline.  2.  Acute on chronic systolic and diastolic heart failure Treated with aggressive IV diuresis. EF 40 to 45%. Patient noncompliant with medical regimen does not take any medication outside of the hospital. Has multiple hospitalization and ER visits. Does not go to community health and wellness clinic to refill his medications.  3.  Acute COPD exacerbation Treated with steroids, duo nebs. Does not have sleep apnea.  4.  Prolonged QT Continue to hold QT prolongation medications.  5.  Schizoaffective disorder Patient seen in consultation by psychiatry who are recommending continuation of Depakote 500 mg twice daily, which patient is refusing. Gabapentin 200 mg twice daily, valproic acid level.  Per psychiatry patient does not meet criteria for inpatient psychiatric admission  6.  Hypertension urgency with CHF Blood pressure significantly elevated. Medication adjusted. Patient does not take any medication outside the hospital.  7.  Hyperlipidemia Statin.    8.  Polysubstance abuse/?? Withdrawal Patient has history of cocaine abuse. UDS negative for cocaine.  Patient states has not used any cocaine since recent discharge. Patient commended.   9.  Hypokalemia Repleted  10.  Hyperglycemia Likely steroid-induced.  Hemoglobin A1c noted at 5.9 (09/09/2020).  Continue Lantus 20 units daily, meal coverage NovoLog 6 units three times daily with meals. Resistant sliding scale insulin. Follow.  11.  History of alcohol use Continue Ativan withdrawal protocol.  12.  Pain control. Patient was started on oxycodone 5 to 10  mg to 4 hours as needed on 10/29/2020. On asking the patient which pain he takes his pain medication for reported that he has low back pain as well as neuropathy pain  bilaterally. I offered to increase the gabapentin to 200 mg 3 times daily. Also suspected that his lower back pain is primarily related to muscle fatigue and spasm from his respiratory distress as well as bedridden condition and patient would benefit from Robaxin scheduled. X-ray lumbar spine was performed which was negative for any acute fracture. Reduce the dose of narcotic pain medication from OxyIR 5 to 10 mg to Percocet 5 mg every 6 hours as needed. Patient was seen with white pills on 10/30/2020 which were found to be OxyIR, unsure if they were his home medication or medications from hospital.  Although per PDMP patient has not been prescribed OxyIR at least in last 1 year.  patient left AMA.  Procedures and Results:  none   Consultations:  none  The results of significant diagnostics from this hospitalization (including imaging, microbiology, ancillary and laboratory) are listed below for reference.    Significant Diagnostic Studies: DG Chest 2 View  Result Date: 10/14/2020 CLINICAL DATA:  54 year old male with shortness of breath. EXAM: CHEST - 2 VIEW COMPARISON:  Chest radiograph dated 10/06/2020. FINDINGS: No focal consolidation, pleural effusion or pneumothorax. Mild cardiomegaly. Atherosclerotic calcification of the aorta. No acute osseous pathology. IMPRESSION: No active cardiopulmonary disease. Electronically Signed   By: Elgie Collard M.D.   On: 10/14/2020 19:13   DG Lumbar Spine 1 View  Result Date: 10/31/2020 CLINICAL DATA:  Respiratory distress. EXAM: LUMBAR SPINE - 1 VIEW COMPARISON:  None. FINDINGS: AP projection only was obtained. No definite fracture is seen involving the visualized lumbar spine. IMPRESSION: No definite abnormality seen in the visualized lumbar spine. Electronically Signed   By: Lupita Raider M.D.   On: 10/31/2020 13:50   DG CHEST PORT 1 VIEW  Result Date: 10/31/2020 CLINICAL DATA:  Respiratory distress. EXAM: PORTABLE CHEST 1 VIEW  COMPARISON:  October 29, 2020. FINDINGS: Stable cardiomegaly. No pneumothorax or pleural effusion is noted. Both lungs are clear. The visualized skeletal structures are unremarkable. IMPRESSION: No active disease. Electronically Signed   By: Lupita Raider M.D.   On: 10/31/2020 13:51   DG CHEST PORT 1 VIEW  Result Date: 10/29/2020 CLINICAL DATA:  Shortness of breath.  Hypoxia. EXAM: PORTABLE CHEST 1 VIEW COMPARISON:  10/26/2020 FINDINGS: Mild enlargement of the cardiopericardial silhouette. The lungs appear clear. No blunting of the costophrenic angles. No significant bony abnormality observed. IMPRESSION: 1. Mild enlargement of the cardiopericardial silhouette, without edema. Electronically Signed   By: Gaylyn Rong M.D.   On: 10/29/2020 11:59   DG Chest Port 1 View  Result Date: 10/26/2020 CLINICAL DATA:  "Dib." Additional history obtained from electronic MEDICAL RECORD NUMBERShortness of breath for 3 days, worsening this morning, history of CHF and COPD, patient reports rib and lower back pain. EXAM: PORTABLE CHEST 1 VIEW COMPARISON:  Prior chest radiographs 10/14/2020. FINDINGS: Unchanged cardiomegaly. The patient's chin partially obscures the medial lung apices. Subtle interstitial opacities within the right mid to lower lung field. The left lung is clear. No evidence of pleural effusion or pneumothorax. No acute bony abnormality identified. IMPRESSION: The patient's chin partially obscures the medial lung apices Unchanged cardiomegaly. Subtle interstitial opacities within the right mid to lower lung field. Primary considerations include incomplete atelectasis, asymmetric edema or atypical/viral infection. Electronically Signed   By: Jackey Loge DO  On: 10/26/2020 08:25   DG CHEST PORT 1 VIEW  Result Date: 10/06/2020 CLINICAL DATA:  Shortness of breath. EXAM: PORTABLE CHEST 1 VIEW COMPARISON:  10/04/2020 FINDINGS: The heart size and mediastinal contours are within normal limits. Both lungs  are clear. The visualized skeletal structures are unremarkable. IMPRESSION: No active disease. Electronically Signed   By: Signa Kell M.D.   On: 10/06/2020 06:36   DG CHEST PORT 1 VIEW  Result Date: 10/04/2020 CLINICAL DATA:  Dyspnea. EXAM: PORTABLE CHEST 1 VIEW COMPARISON:  Prior chest radiograph 10/02/2020. FINDINGS: Unchanged cardiomegaly. No appreciable airspace consolidation or pulmonary edema. No evidence of pleural effusion or pneumothorax. No acute bony abnormality identified. IMPRESSION: Cardiomegaly.  No evidence of acute cardiopulmonary abnormality. Electronically Signed   By: Jackey Loge DO   On: 10/04/2020 08:37   DG Chest Portable 1 View  Result Date: 10/02/2020 CLINICAL DATA:  Shortness of breath EXAM: PORTABLE CHEST 1 VIEW COMPARISON:  09/25/2020 FINDINGS: Cardiomegaly. No confluent opacities, effusions or edema. No acute bony abnormality. IMPRESSION: Cardiomegaly.  No active disease. Electronically Signed   By: Charlett Nose M.D.   On: 10/02/2020 19:01    Microbiology: Recent Results (from the past 240 hour(s))  Resp Panel by RT-PCR (Flu A&B, Covid) Nasopharyngeal Swab     Status: None   Collection Time: 10/26/20  7:49 AM   Specimen: Nasopharyngeal Swab; Nasopharyngeal(NP) swabs in vial transport medium  Result Value Ref Range Status   SARS Coronavirus 2 by RT PCR NEGATIVE NEGATIVE Final    Comment: (NOTE) SARS-CoV-2 target nucleic acids are NOT DETECTED.  The SARS-CoV-2 RNA is generally detectable in upper respiratory specimens during the acute phase of infection. The lowest concentration of SARS-CoV-2 viral copies this assay can detect is 138 copies/mL. A negative result does not preclude SARS-Cov-2 infection and should not be used as the sole basis for treatment or other patient management decisions. A negative result may occur with  improper specimen collection/handling, submission of specimen other than nasopharyngeal swab, presence of viral mutation(s) within  the areas targeted by this assay, and inadequate number of viral copies(<138 copies/mL). A negative result must be combined with clinical observations, patient history, and epidemiological information. The expected result is Negative.  Fact Sheet for Patients:  BloggerCourse.com  Fact Sheet for Healthcare Providers:  SeriousBroker.it  This test is no t yet approved or cleared by the [place] FDA and  has been authorized for detection and/or diagnosis of SARS-CoV-2 by FDA under an Emergency Use Authorization (EUA). This EUA will remain  in effect (meaning this test can be used) for the duration of the COVID-19 declaration under Section 564(b)(1) of the Act, 21 U.S.C.section 360bbb-3(b)(1), unless the authorization is terminated  or revoked sooner.       Influenza A by PCR NEGATIVE NEGATIVE Final   Influenza B by PCR NEGATIVE NEGATIVE Final    Comment: (NOTE) The Xpert Xpress SARS-CoV-2/FLU/RSV plus assay is intended as an aid in the diagnosis of influenza from Nasopharyngeal swab specimens and should not be used as a sole basis for treatment. Nasal washings and aspirates are unacceptable for Xpert Xpress SARS-CoV-2/FLU/RSV testing.  Fact Sheet for Patients: BloggerCourse.com  Fact Sheet for Healthcare Providers: SeriousBroker.it  This test is not yet approved or cleared by the [place] FDA and has been authorized for detection and/or diagnosis of SARS-CoV-2 by FDA under an Emergency Use Authorization (EUA). This EUA will remain in effect (meaning this test can be used) for the duration of the COVID-19  declaration under Section 564(b)(1) of the Act, 21 U.S.C. section 360bbb-3(b)(1), unless the authorization is terminated or revoked.  Performed at [facility], [address], [place], [name] [address]   Culture, Urine     Status: None    Collection Time: 10/27/20  8:14 AM   Specimen: Urine, Clean Catch  Result Value Ref Range Status   Specimen Description   Final    URINE, CLEAN CATCH Performed at [facility], [address] [place], [name] [address]    Special Requests   Final    CATH Performed at [facility], [address], [place], [name] [address]    Culture   Final    NO GROWTH Performed at [facility], [address] [place], [name] [address]    Report Status 10/28/2020 FINAL  Final     Labs: CBC: Recent Labs  Lab 10/27/20 0520 10/28/20 0622 10/29/20 1300 10/30/20 0544 10/31/20 0717  WBC 15.1* 22.6* 20.7* 19.5* 17.0*  HGB 12.6* 13.6 13.7 14.2 14.3  HCT 39.0 41.4 41.7 43.0 43.0  MCV 102.1* 101.5* 102.5* 100.7* 98.9  PLT 248 263 242 258 266   Basic Metabolic Panel: Recent Labs  Lab 10/26/20 0732 10/27/20 0520 10/28/20 0622 10/28/20 1249 10/29/20 1300 10/30/20 0544 10/31/20 0717  NA 139   < > 138 134* 137 139 138  K 3.3*   < > 4.4 4.2 4.0 4.2 4.0  CL 108   < > 101 98 99 102 98  CO2 22   < > 23 20* 24 25 28   GLUCOSE 90   < > 386* 466* 465* 259* 250*  BUN 16   < > 22* 25* 31* 29* 28*  CREATININE 1.13   < > 1.44* 1.53* 1.38* 1.27* 1.19  CALCIUM 7.8*   < > 9.4 9.2 8.9 8.8* 8.8*  MG 2.5*  --   --   --  1.8 1.9 2.1   < > = values in this interval not displayed.   Liver Function Tests: No results for input(s): AST, ALT, ALKPHOS, BILITOT, PROT, ALBUMIN in the last 168 hours. No results for input(s): LIPASE, AMYLASE in the last 168 hours. No results for input(s): AMMONIA in the last 168 hours. Cardiac Enzymes: No results for input(s): CKTOTAL, CKMB, CKMBINDEX, TROPONINI in the last 168 hours. BNP (last 3 results) Recent Labs    10/14/20 2023 10/26/20 0732 10/29/20 1300  BNP 1,183.4* 1,075.9* 322.3*   CBG: Recent Labs  Lab 10/30/20 1220 10/30/20 1645 10/30/20 2036 10/31/20 0740 10/31/20 1135  GLUCAP 212* 184* 262* 232* 296*    Time spent: 35 minutes  Signed:  14/01/21  [facility] 10/31/2020, 6:33 PM

## 2020-10-31 NOTE — Progress Notes (Addendum)
RN called to bedside by patient, upon entering pt was on the phone and asked whom he was speaking to to call back in exactly 10 minutes. RN asked pt how I could help and he said "is the doctor still here." RN said I could page him and pt requested he be discharged. RN educated pt that he is not medically stable for discharge and that if he leaves the facility he will have to re-enter through the emergency department to obtain care again, pt said "I do not care I'm fixing to leave up out of here." RN educated pt that he would need to sign and AMA form and pt refused to sign the form. Pt asked RN to provide him with inhalers and pt took the inhalers on his bedside table with him. RN let telesitter know pt is leaving and refusing to sign AMA forms. RN also let charge RN, Annabelle Harman and Dr. Lynden Oxford know that he is leaving but refusing to sign AMA form. IV removed.   [id number]-pt walked outside with his belongings he was admitted with (clothing and shoes). RN encouraged pt to take care of himself while he was walking off the floor.

## 2020-11-05 ENCOUNTER — Emergency Department (HOSPITAL_COMMUNITY)
Admission: EM | Admit: 2020-11-05 | Discharge: 2020-11-06 | Disposition: A | Discharge status: Home / Self Care | Payer: Medicaid Other | Attending: Emergency Medicine | Admitting: Emergency Medicine

## 2020-11-05 ENCOUNTER — Encounter (HOSPITAL_COMMUNITY): Payer: Self-pay

## 2020-11-05 ENCOUNTER — Emergency Department (HOSPITAL_COMMUNITY): Payer: Medicaid Other

## 2020-11-05 DIAGNOSIS — I110 Hypertensive heart disease with heart failure: Secondary | ICD-10-CM | POA: Insufficient documentation

## 2020-11-05 DIAGNOSIS — Z7901 Long term (current) use of anticoagulants: Secondary | ICD-10-CM | POA: Diagnosis not present

## 2020-11-05 DIAGNOSIS — R042 Hemoptysis: Secondary | ICD-10-CM | POA: Diagnosis not present

## 2020-11-05 DIAGNOSIS — J45909 Unspecified asthma, uncomplicated: Secondary | ICD-10-CM | POA: Insufficient documentation

## 2020-11-05 DIAGNOSIS — F17210 Nicotine dependence, cigarettes, uncomplicated: Secondary | ICD-10-CM | POA: Insufficient documentation

## 2020-11-05 DIAGNOSIS — E119 Type 2 diabetes mellitus without complications: Secondary | ICD-10-CM | POA: Insufficient documentation

## 2020-11-05 DIAGNOSIS — R0602 Shortness of breath: Secondary | ICD-10-CM | POA: Diagnosis present

## 2020-11-05 DIAGNOSIS — Z79899 Other long term (current) drug therapy: Secondary | ICD-10-CM | POA: Diagnosis not present

## 2020-11-05 DIAGNOSIS — J441 Chronic obstructive pulmonary disease with (acute) exacerbation: Secondary | ICD-10-CM | POA: Diagnosis not present

## 2020-11-05 DIAGNOSIS — I5043 Acute on chronic combined systolic (congestive) and diastolic (congestive) heart failure: Secondary | ICD-10-CM | POA: Diagnosis not present

## 2020-11-05 LAB — CBC WITH DIFFERENTIAL/PLATELET
Abs Immature Granulocytes: 0.05 10*3/uL (ref 0.00–0.07)
Basophils Absolute: 0 10*3/uL (ref 0.0–0.1)
Basophils Relative: 0 %
Eosinophils Absolute: 0.1 10*3/uL (ref 0.0–0.5)
Eosinophils Relative: 1 %
HCT: 38.8 % — ABNORMAL LOW (ref 39.0–52.0)
Hemoglobin: 13.1 g/dL (ref 13.0–17.0)
Immature Granulocytes: 1 %
Lymphocytes Relative: 30 %
Lymphs Abs: 2.7 10*3/uL (ref 0.7–4.0)
MCH: 33.5 pg (ref 26.0–34.0)
MCHC: 33.8 g/dL (ref 30.0–36.0)
MCV: 99.2 fL (ref 80.0–100.0)
Monocytes Absolute: 1 10*3/uL (ref 0.1–1.0)
Monocytes Relative: 11 %
Neutro Abs: 5.1 10*3/uL (ref 1.7–7.7)
Neutrophils Relative %: 57 %
Platelets: 216 10*3/uL (ref 150–400)
RBC: 3.91 MIL/uL — ABNORMAL LOW (ref 4.22–5.81)
RDW: 15.2 % (ref 11.5–15.5)
WBC: 8.9 10*3/uL (ref 4.0–10.5)
nRBC: 0 % (ref 0.0–0.2)

## 2020-11-05 IMAGING — CR DG CHEST 2V
2 series · 2 of 2 positions shown · non-contrast
Comparison: Radiographs [DATE] and [DATE].  CT [DATE].

CLINICAL DATA: Shortness of breath for 2 days. History of
congestive heart failure.

EXAM:
CHEST - 2 VIEW

[w chest lat]
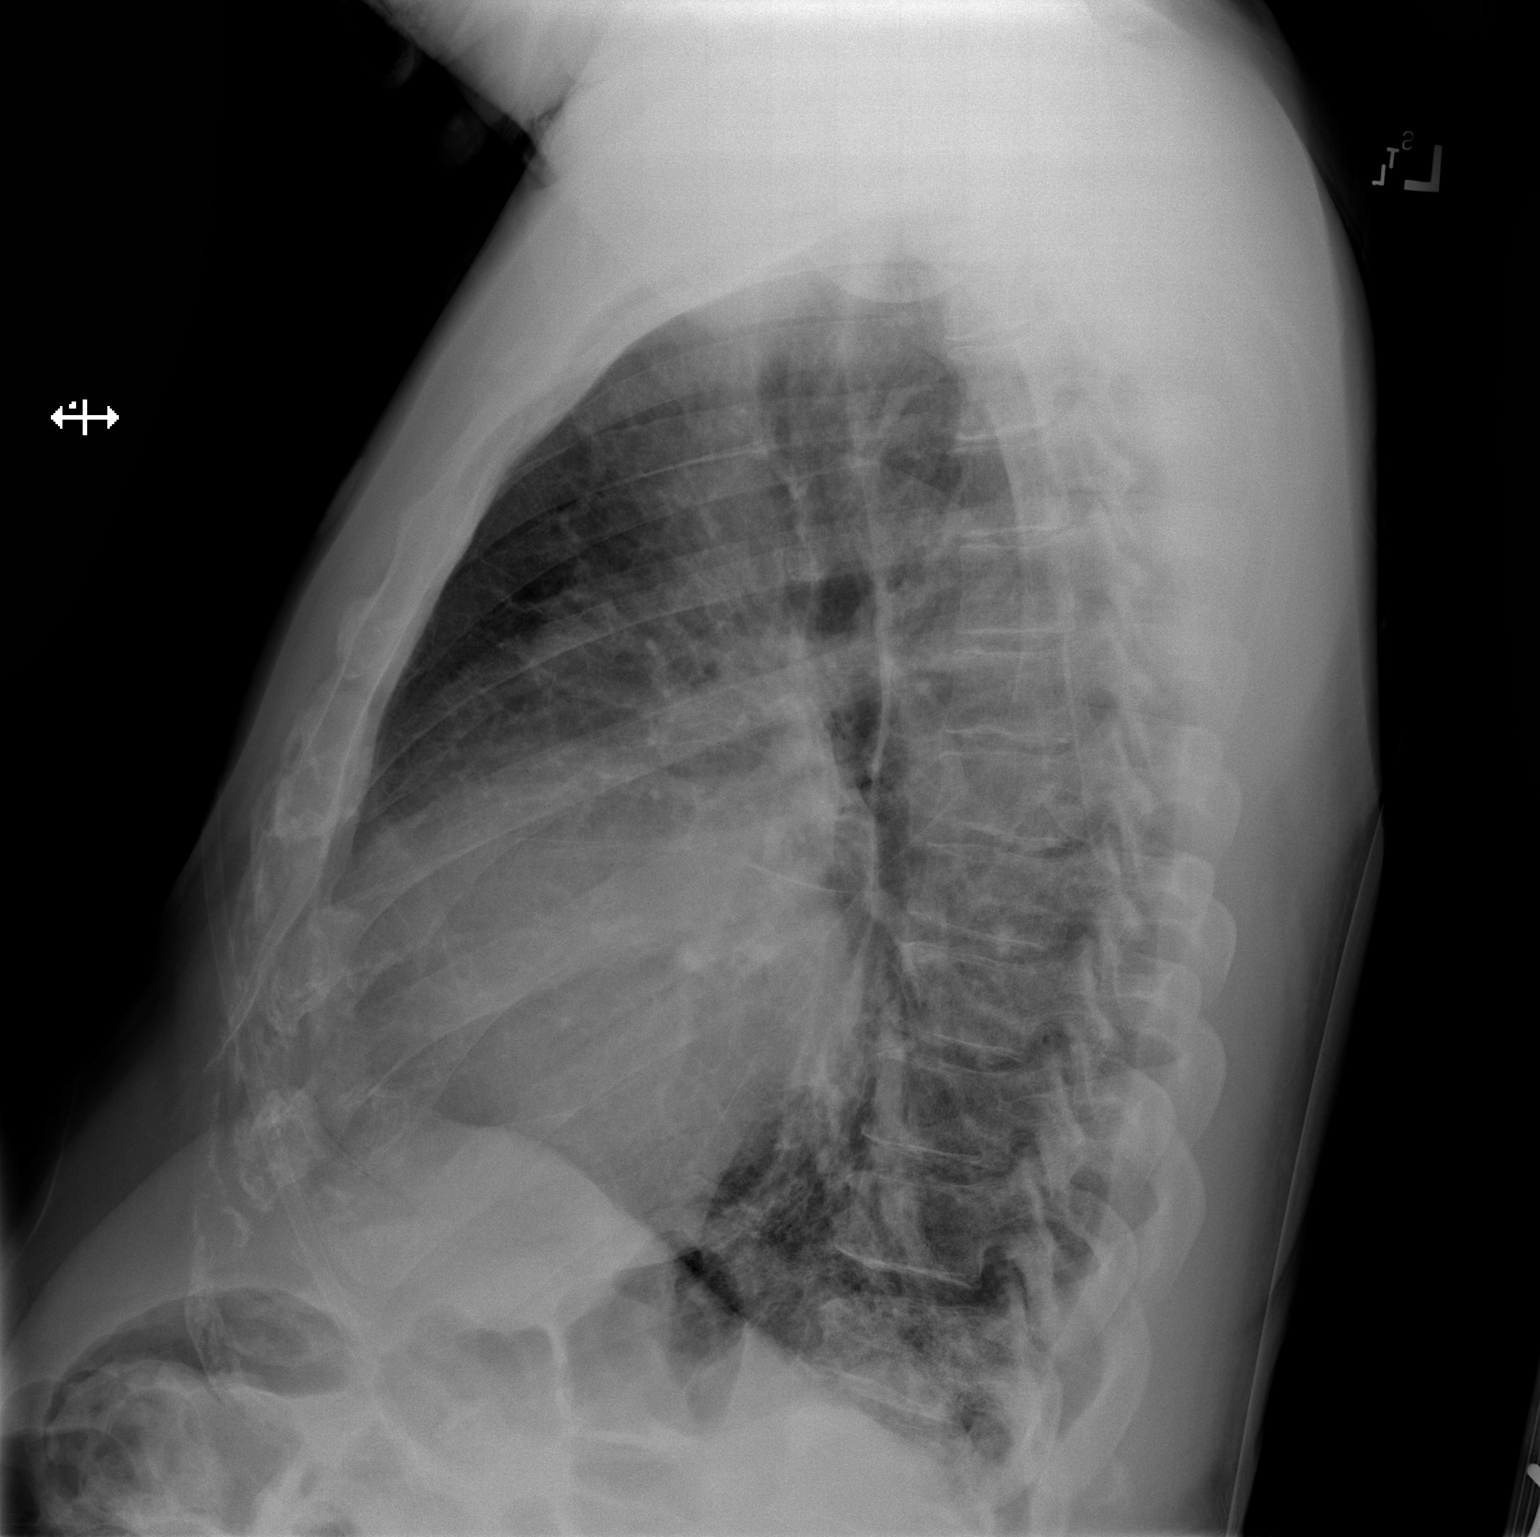

[x chest ap]
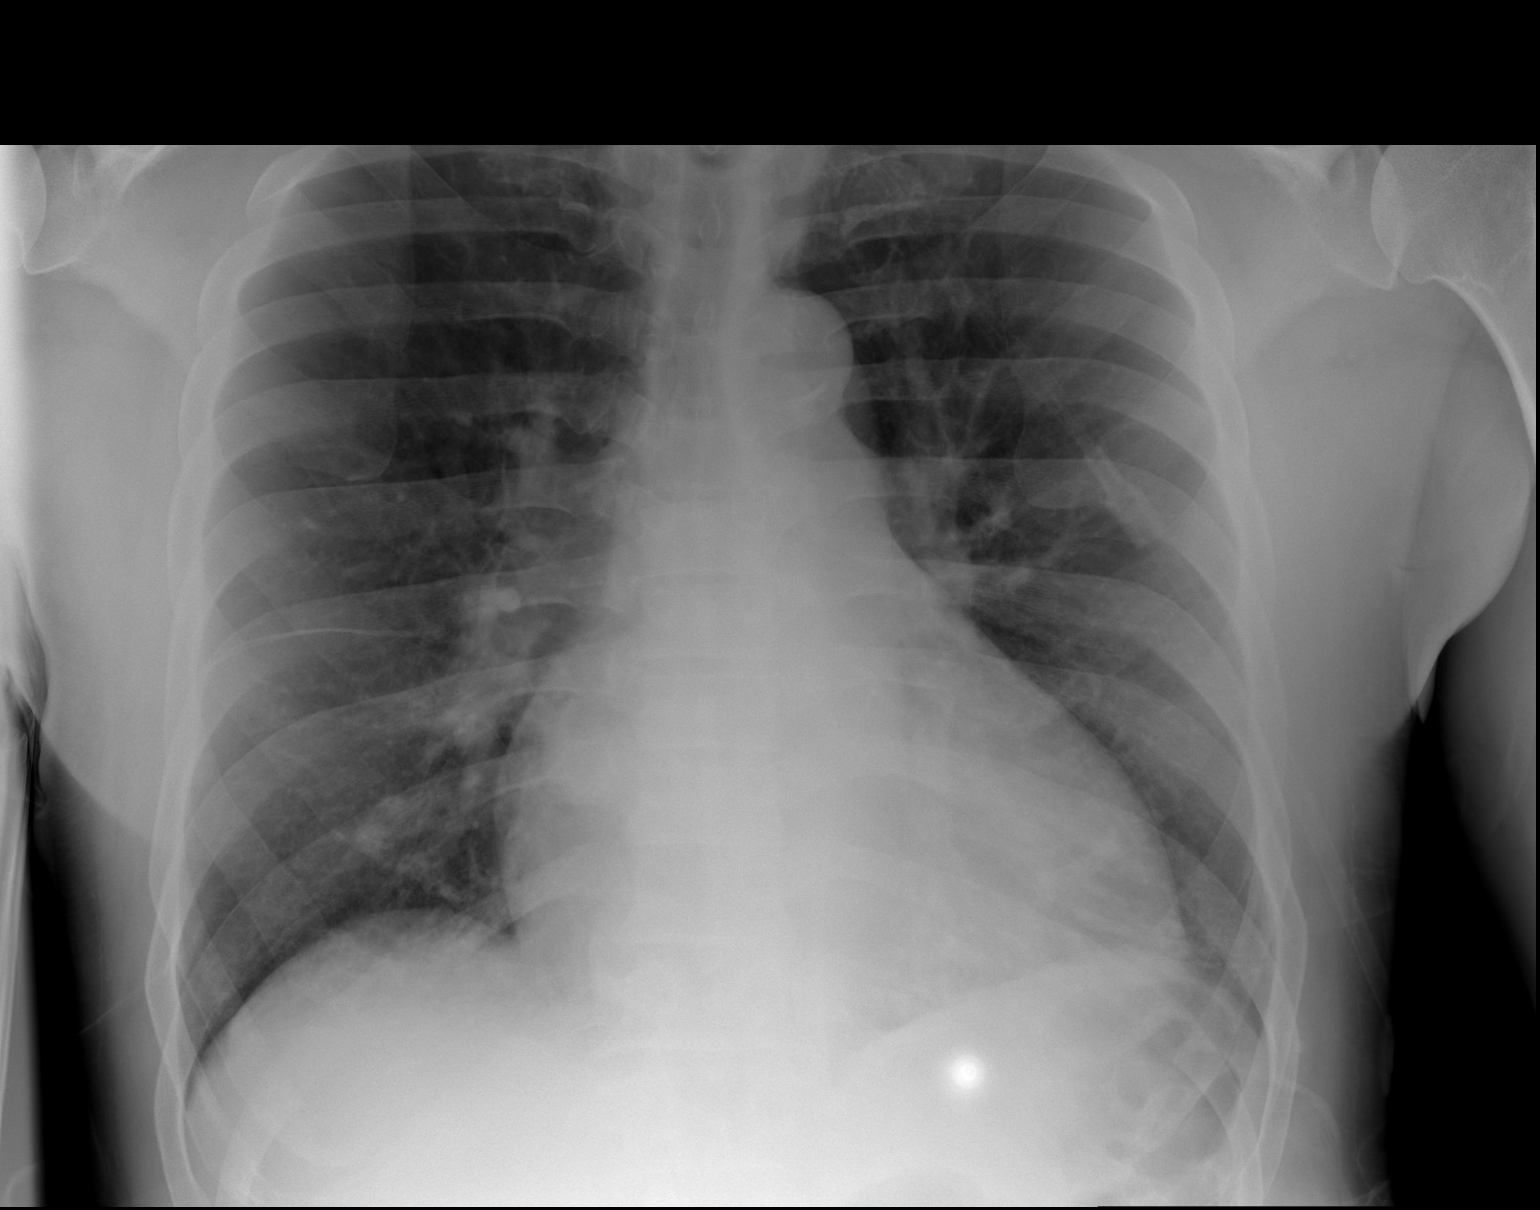

[2 of 2 positions shown; findings below may reference images not displayed]

FINDINGS: The heart size and mediastinal contours are stable with mild
cardiomegaly and aortic atherosclerosis. The pulmonary vascularity
is normal. There is stable fissural and interstitial thickening at
both lung bases. No edema, confluent airspace opacity, significant
pleural effusion or pneumothorax. The bones appear unchanged.
IMPRESSION: Stable cardiomegaly and interstitial thickening at both lung bases.
No acute cardiopulmonary process.

## 2020-11-05 NOTE — ED Notes (Signed)
Patient arrived to ED without home oxygen, wears 3L O2 at home. Patient placed on 3L O2 portable tank.

## 2020-11-05 NOTE — ED Provider Notes (Signed)
[facility]-EMERGENCY DEPT Provider Note   CSN: [id number] Arrival date & time: 11/05/20  1809     History Chief Complaint  Patient presents with  . Shortness of Breath  . Hemoptysis    Lenin Kuhnle is a 54 y.o. male.  The history is provided by the patient and medical records.  Shortness of Breath  Whyatt Klinger is a 54 y.o. male who presents to the Emergency Department complaining of shortness of breath. He presents to the emergency department complaining of shortness of breath that began three days ago. He has a history of CHF, COPD. He was admitted to the hospital about one week ago for CHF exacerbation and left without refills on his medications. He states that about three days ago he developed increased shortness of breath with cough productive of clear and burgundy sputum. He is unable to quantify how much he is coughing up. He denies any fevers, chest pain. He does feel like he is having bilateral lower extremity edema over the last three days. Per records he is supposed to be on 3 L of oxygen at home but state patient states he is not supposed to be on home oxygen. He is using inhalers at home with no significant improvement in symptoms. He smokes tobacco and drinks alcohol but has not done either for several days. He denies any drug use.    Past Medical History:  Diagnosis Date  . Asthma   . Bipolar 1 disorder (HCC)   . CHF (congestive heart failure) (HCC)   . COPD (chronic obstructive pulmonary disease) (HCC)   . Depression   . Diabetes mellitus without complication (HCC)   . GERD (gastroesophageal reflux disease)   . Homelessness   . Hypertension   . QT prolongation   . Schizoaffective disorder [facility])     Patient Active Problem List   Diagnosis Date Noted  . Hypokalemia   . Hyperlipidemia   . Acute respiratory failure with hypoxia (HCC) 10/26/2020  . COPD with acute exacerbation (HCC) 10/14/2020  . Acute on chronic combined systolic and  diastolic CHF (congestive heart failure) (HCC) 10/02/2020  . Near syncope 07/21/2020  . Schizoaffective disorder (HCC) 06/24/2019  . SOB (shortness of breath)   . QT prolongation 05/25/2019  . Cocaine abuse (HCC)   . Elevated troponin   . Homelessness   . Suicidal ideation   . Bilateral foot pain 04/05/2019  . Housing situation unstable 03/28/2019  . Bilateral lower extremity edema   . Asthma 12/26/2016  . Tobacco use disorder 12/26/2016  . Schizophrenia (HCC) 12/26/2016  . Cocaine abuse with cocaine-induced mood disorder (HCC) 09/06/2016  . Alcohol use disorder, mild, abuse 07/31/2016  . Substance induced mood disorder (HCC) 07/30/2016  . Schizoaffective disorder, bipolar type (HCC) 05/31/2016  . Cocaine use disorder, severe, dependence (HCC) 05/31/2016  . Essential hypertension 05/31/2016    History reviewed. No pertinent surgical history.     Family History  Problem Relation Age of Onset  . Drug abuse Mother   . Suicidality Mother     Social History   Tobacco Use  . Smoking status: Current Every Day Smoker    Years: 30.00    Types: Cigarettes  . Smokeless tobacco: Never Used  Vaping Use  . Vaping Use: Never used  Substance Use Topics  . Alcohol use: Yes    Comment: infrequently  . Drug use: Yes    Types: Cocaine, Marijuana    Home Medications Prior to Admission medications   Medication  Sig Start Date End Date Taking? Authorizing Provider  albuterol (VENTOLIN HFA) 108 (90 Base) MCG/ACT inhaler Inhale 2 puffs into the lungs every 6 (six) hours as needed for wheezing or shortness of breath. 09/26/20   Leatha Gilding, MD  atorvastatin (LIPITOR) 20 MG tablet Take 1 tablet (20 mg total) by mouth daily. 10/07/20   Sheikh, Omair Latif, DO  carvedilol (COREG) 3.125 MG tablet Take 3.125 mg by mouth 2 (two) times daily with a meal.    [provider]  cloNIDine (CATAPRES) 0.1 MG tablet Take 0.1 mg by mouth 3 (three) times daily.    [provider]   clopidogrel (PLAVIX) 75 MG tablet Take 1 tablet (75 mg total) by mouth daily. 10/08/20   Marguerita Merles Latif, DO  divalproex (DEPAKOTE ER) 500 MG 24 hr tablet Take 500 mg by mouth in the morning and at bedtime.    [provider]  doxycycline (VIBRA-TABS) 100 MG tablet Take 100 mg by mouth 2 (two) times daily.    [provider]  furosemide (LASIX) 40 MG tablet Take 1 tablet (40 mg total) by mouth daily. 10/07/20 11/06/20  Marguerita Merles Latif, DO  gabapentin (NEURONTIN) 100 MG capsule Take 2 capsules (200 mg total) by mouth 2 (two) times daily. 10/07/20   Marguerita Merles Latif, DO  hydrALAZINE (APRESOLINE) 50 MG tablet Take 1 tablet (50 mg total) by mouth every 8 (eight) hours. 10/07/20   Marguerita Merles Latif, DO  isosorbide mononitrate (IMDUR) 30 MG 24 hr tablet Take 1 tablet (30 mg total) by mouth daily. 10/07/20 11/06/20  Marguerita Merles Latif, DO  lisinopril (ZESTRIL) 20 MG tablet Take 20 mg by mouth in the morning and at bedtime.    [provider]  losartan (COZAAR) 50 MG tablet Take 1 tablet (50 mg total) by mouth daily. 10/19/20 11/18/20  Jerald Kief, MD  neomycin-bacitracin-polymyxin (NEOSPORIN) ointment Apply topically 2 (two) times daily. Patient taking differently: Apply 1 application topically 2 (two) times daily.  09/26/20   Leatha Gilding, MD  senna-docusate (SENOKOT-S) 8.6-50 MG tablet Take 1 tablet by mouth at bedtime as needed for mild constipation. 10/07/20   Marguerita Merles Latif, DO  spironolactone (ALDACTONE) 25 MG tablet Take 0.5 tablets (12.5 mg total) by mouth daily. 10/19/20 11/18/20  Jerald Kief, MD  torsemide (DEMADEX) 20 MG tablet Take 20 mg by mouth 2 (two) times daily.    [provider]  traZODone (DESYREL) 50 MG tablet Take 50 mg by mouth at bedtime.    [provider]  umeclidinium bromide (INCRUSE ELLIPTA) 62.5 MCG/INH AEPB Inhale 1 puff into the lungs daily.    [provider]    Allergies    Ibuprofen and  Acetaminophen  Review of Systems   Review of Systems  Respiratory: Positive for shortness of breath.   All other systems reviewed and are negative.   Physical Exam Updated Vital Signs BP (!) 154/110 (BP Location: Left Arm)   Pulse 87   Temp 98.1 F (36.7 C) (Oral)   Resp (!) 30   Ht 5\' 9"  (1.753 m)   Wt 86.2 kg   SpO2 99%   BMI 28.06 kg/m   Physical Exam Vitals and nursing note reviewed.  Constitutional:      Appearance: He is well-developed.  HENT:     Head: Normocephalic and atraumatic.  Cardiovascular:     Rate and Rhythm: Normal rate and regular rhythm.     Heart sounds: No murmur heard.  Pulmonary:     Effort: No respiratory distress.     Breath sounds: Normal breath sounds.     Comments: Speaks in full sentences. Tachypnea. Good air movement bilaterally, no wheezes. Abdominal:     Palpations: Abdomen is soft.     Tenderness: There is no abdominal tenderness. There is no guarding or rebound.  Musculoskeletal:        General: No swelling or tenderness.  Skin:    General: Skin is warm and dry.  Neurological:     Mental Status: He is alert and oriented to person, place, and time.  Psychiatric:        Behavior: Behavior normal.     ED Results / Procedures / Treatments   Labs (all labs ordered are listed, but only abnormal results are displayed) Labs Reviewed  CBC WITH DIFFERENTIAL/PLATELET - Abnormal; Notable for the following components:      Result Value   RBC 3.91 (*)    HCT 38.8 (*)    All other components within normal limits  BRAIN NATRIURETIC PEPTIDE  COMPREHENSIVE METABOLIC PANEL  TROPONIN I (HIGH SENSITIVITY)  TROPONIN I (HIGH SENSITIVITY)    EKG EKG Interpretation  Date/Time:  Monday November 05 2020 18:28:31 EST Ventricular Rate:  92 PR Interval:    QRS Duration: 92 QT Interval:  417 QTC Calculation: 516 R Axis:   11 Text Interpretation: Sinus rhythm Biatrial enlargement Left ventricular hypertrophy Borderline T abnormalities,  lateral leads Prolonged QT interval 12 Lead; Mason-Likar No significant change since prior 11/21 Confirmed by Meridee Score [phone]) on 11/05/2020 7:02:33 PM   Radiology DG Chest 2 View  Result Date: 11/05/2020 CLINICAL DATA:  Shortness of breath for 2 days. History of congestive heart failure. EXAM: CHEST - 2 VIEW COMPARISON:  Radiographs 10/31/2020 and 10/14/2020.  CT 06/21/2019. FINDINGS: The heart size and mediastinal contours are stable with mild cardiomegaly and aortic atherosclerosis. The pulmonary vascularity is normal. There is stable fissural and interstitial thickening at both lung bases. No edema, confluent airspace opacity, significant pleural effusion or pneumothorax. The bones appear unchanged. IMPRESSION: Stable cardiomegaly and interstitial thickening at both lung bases. No acute cardiopulmonary process. Electronically Signed   By: Carey Bullocks M.D.   On: 11/05/2020 19:21    Procedures Procedures (including critical care time)  Medications Ordered in ED Medications - No data to display  ED Course  I have reviewed the triage vital signs and the nursing notes.  Pertinent labs & imaging results that were available during my care of the patient were reviewed by me and considered in my medical decision making (see chart for details).    MDM Rules/Calculators/A&P                         patient with history of COPD and CHF here for evaluation of shortness of breath and hemoptysis. On presentation patient states he is very short of breath. He is to But speaking in full and rapid sentences. Lungs are clear bilaterally. Chest x-ray without acute abnormalities. Given reports of hemoptysis, recent hospitalization plan to obtain CT PE study to rule out PE. Patient care transferred pending CT, labs.  Final Clinical Impression(s) / ED Diagnoses Final diagnoses:  None    Rx / DC Orders ED Discharge Orders    None       Tilden Fossa, MD 11/06/20 [phone]

## 2020-11-05 NOTE — ED Notes (Signed)
Patient [place] himself [place] [employer] [place].

## 2020-11-05 NOTE — ED Notes (Signed)
New oxygen tank replaced.

## 2020-11-05 NOTE — ED Triage Notes (Addendum)
Per EMS- Patient c/o SOB x 2 days. Patient reports a history of CHF. Patient reports that he has has been using his albuterol inhaler frequently with no relief.  While in triage-patient reported that he has been coughing up blood x 3 days. Paitent also has swelling of both feet and legs x 3 days. Patient hypertensive in triage- 151/112. Patient has not had any of meds in a week. Patient states he does not have any to take.

## 2020-11-06 ENCOUNTER — Emergency Department (HOSPITAL_COMMUNITY): Payer: Medicaid Other

## 2020-11-06 ENCOUNTER — Encounter (HOSPITAL_COMMUNITY): Payer: Self-pay

## 2020-11-06 ENCOUNTER — Other Ambulatory Visit: Payer: Self-pay

## 2020-11-06 LAB — COMPREHENSIVE METABOLIC PANEL
ALT: 27 U/L (ref 0–44)
AST: 37 U/L (ref 15–41)
Albumin: 3.3 g/dL — ABNORMAL LOW (ref 3.5–5.0)
Alkaline Phosphatase: 73 U/L (ref 38–126)
Anion gap: 8 (ref 5–15)
BUN: 25 mg/dL — ABNORMAL HIGH (ref 6–20)
CO2: 22 mmol/L (ref 22–32)
Calcium: 8 mg/dL — ABNORMAL LOW (ref 8.9–10.3)
Chloride: 109 mmol/L (ref 98–111)
Creatinine, Ser: 1.31 mg/dL — ABNORMAL HIGH (ref 0.61–1.24)
GFR, Estimated: >60 mL/min (ref >60)
Glucose, Bld: 102 mg/dL — ABNORMAL HIGH (ref 70–99)
Potassium: 4.1 mmol/L (ref 3.5–5.1)
Sodium: 139 mmol/L (ref 135–145)
Total Bilirubin: 0.8 mg/dL (ref 0.3–1.2)
Total Protein: 6 g/dL — ABNORMAL LOW (ref 6.5–8.1)

## 2020-11-06 LAB — TROPONIN I (HIGH SENSITIVITY)
Troponin I (High Sensitivity): 47 ng/L — ABNORMAL HIGH (ref <18)
Troponin I (High Sensitivity): 54 ng/L — ABNORMAL HIGH (ref <18)

## 2020-11-06 LAB — BRAIN NATRIURETIC PEPTIDE: B Natriuretic Peptide: 499.8 pg/mL — ABNORMAL HIGH (ref 0.0–100.0)

## 2020-11-06 IMAGING — CT CT ANGIO CHEST
2 of 6 series · 18 of 36 positions shown · IV contrast (OMNIPAQUE 350)
Comparison: None.

CLINICAL DATA: Productive cough

EXAM:
CT ANGIOGRAPHY CHEST WITH CONTRAST
TECHNIQUE: Multidetector CT imaging of the chest was performed using the
standard protocol during bolus administration of intravenous
contrast. Multiplanar CT image reconstructions and MIPs were
obtained to evaluate the vascular anatomy.
CONTRAST:  80mL OMNIPAQUE IOHEXOL 350 MG/ML SOLN

[Series 5: thins · axial · 0.70mm/px · z∈[+21,+277]mm · 17 of 288 slices shown]
[im 16/288  lung]
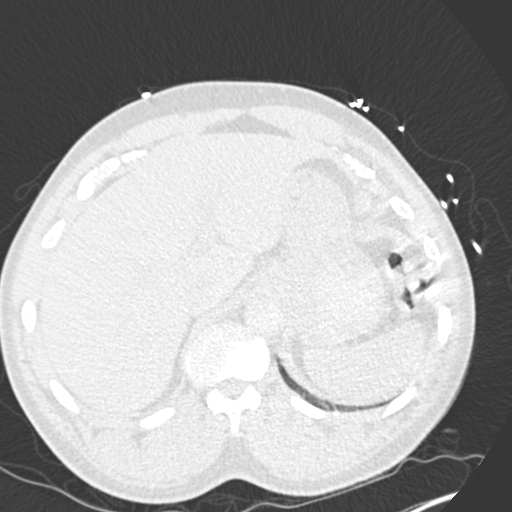
[im 32/288  mediastinal]
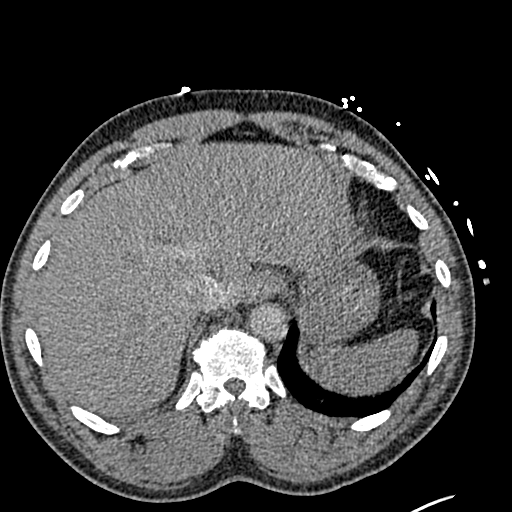
[im 48/288  lung]
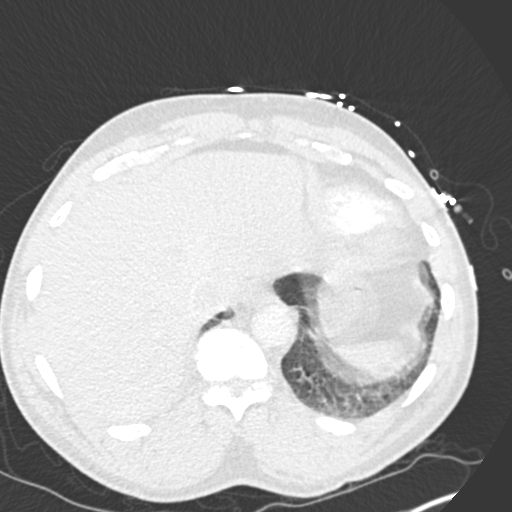
[im 64/288  mediastinal]
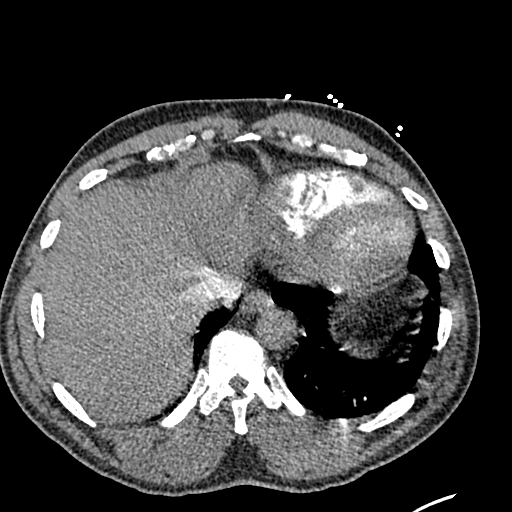
[im 80/288  lung]
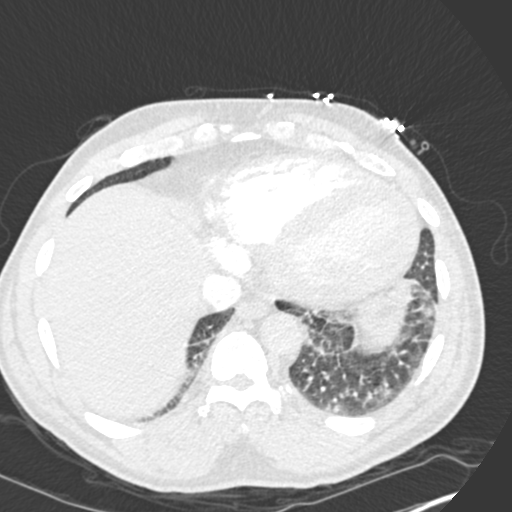
[im 96/288  mediastinal]
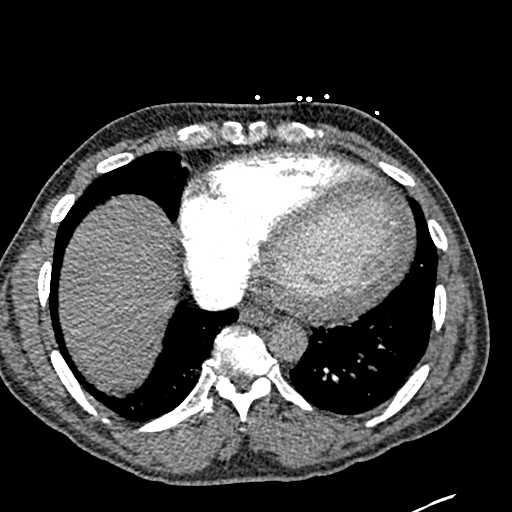
[im 112/288  lung]
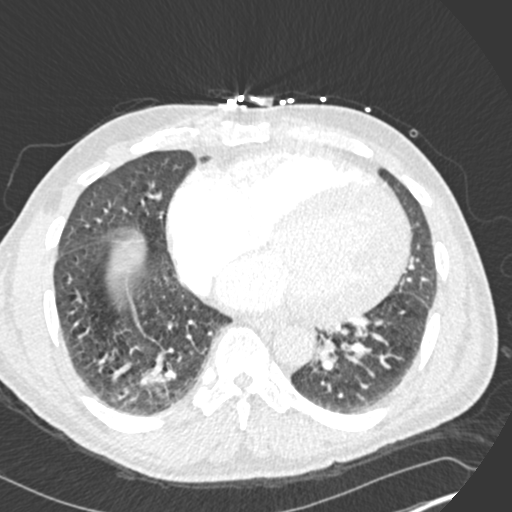
[im 128/288  mediastinal]
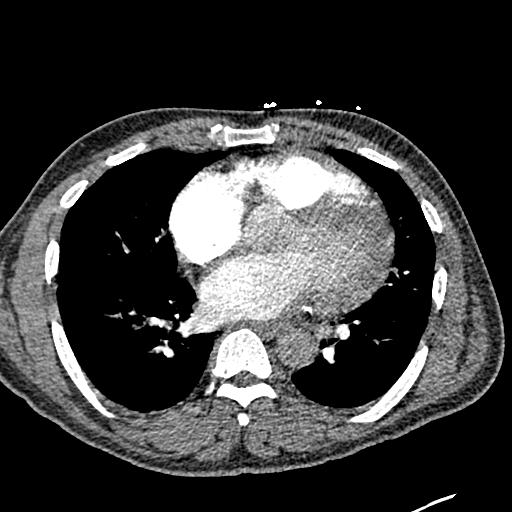
[im 144/288  lung]
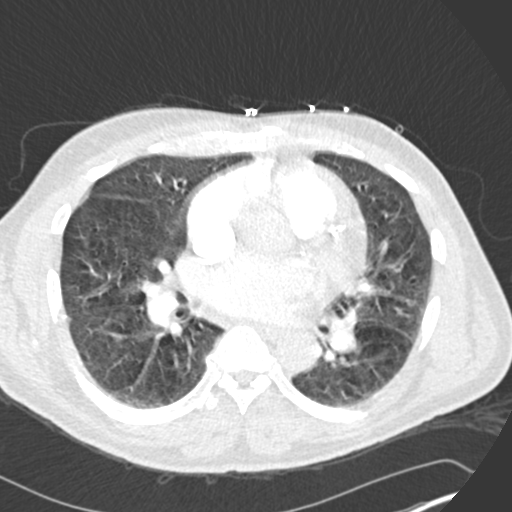
[im 160/288  mediastinal]
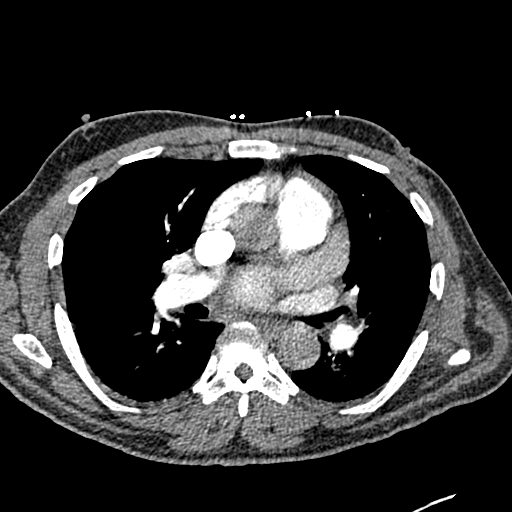
[im 176/288  lung]
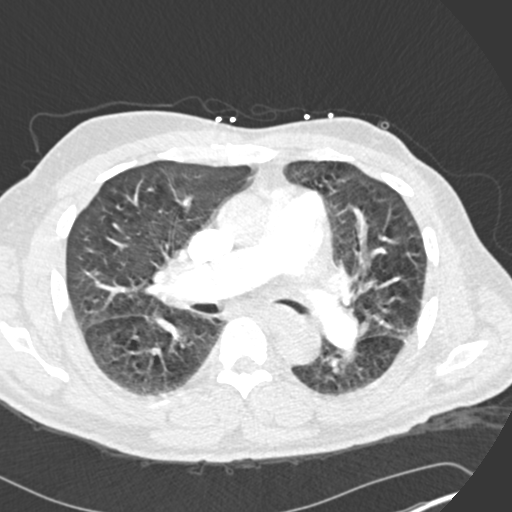
[im 192/288  mediastinal]
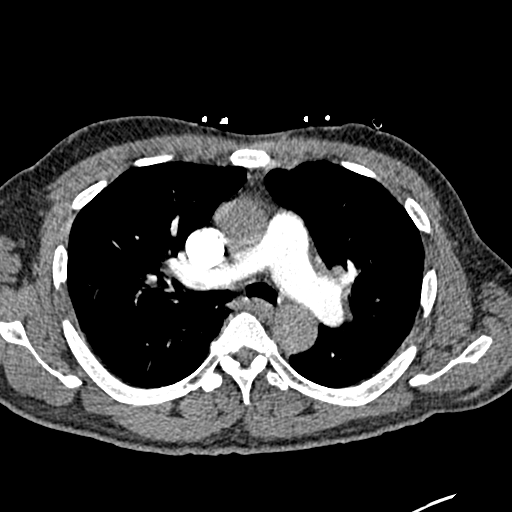
[im 208/288  lung]
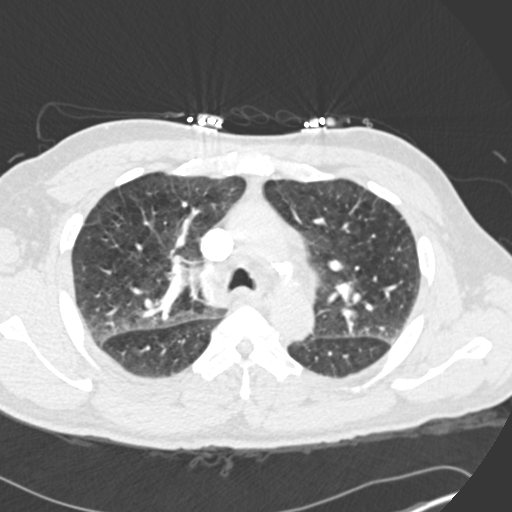
[im 224/288  mediastinal]
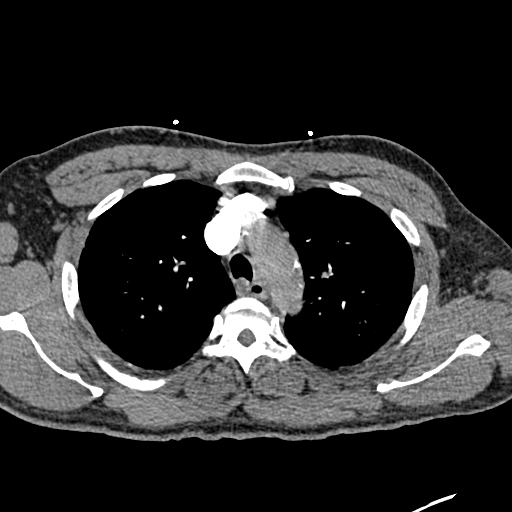
[im 240/288  lung]
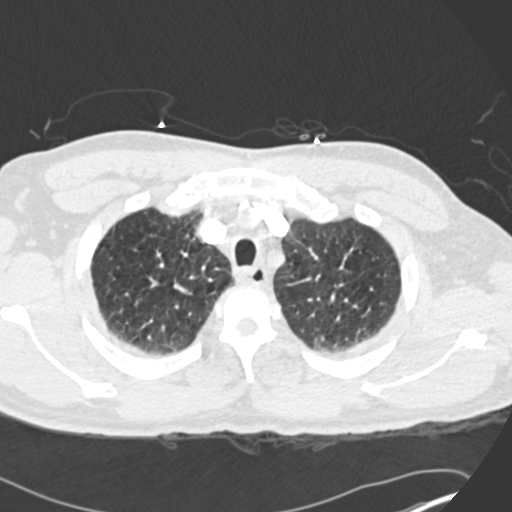
[im 256/288  mediastinal]
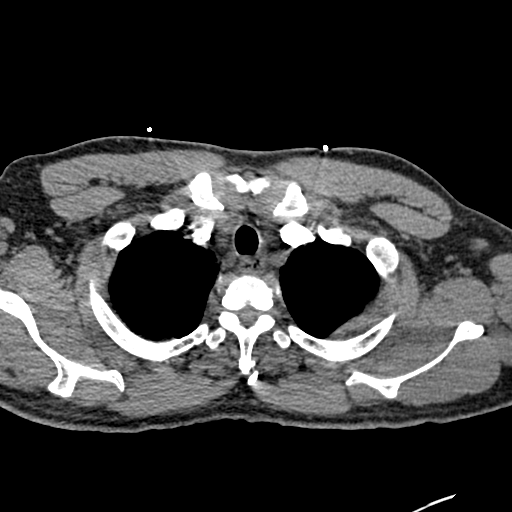
[im 272/288  lung]
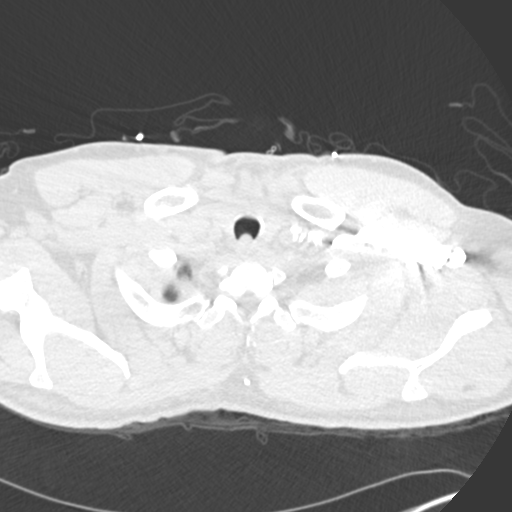

[Series 7: coronal mpr · coronal · 0.57mm/px · 1 of 137 slices shown]
[im 69/137  mediastinal]
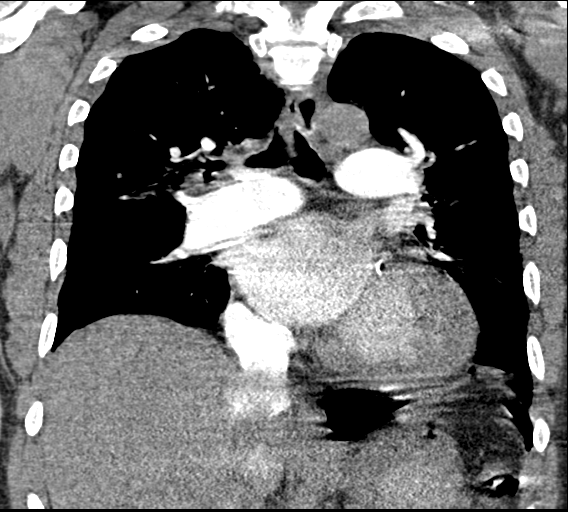

[18 of 36 positions shown; findings below may reference images not displayed]

FINDINGS: Cardiovascular: The study is somewhat limited due to respiratory
artifact. There is a optimal opacification of the pulmonary
arteries. There is no central,segmental, or subsegmental filling
defects within the pulmonary arteries. There is mild cardiomegaly.
No pericardial effusion or thickening. No evidence right heart
strain. There is normal three-vessel brachiocephalic anatomy without
proximal stenosis. Scattered mild aortic atherosclerosis is seen.

Mediastinum/Nodes: No hilar, mediastinal, or axillary adenopathy.
Thyroid gland, trachea, and esophagus demonstrate no significant
findings.

Lungs/Pleura: Hazy airspace opacity seen at the posterior left lung
base. No pleural effusion or pneumothorax. No airspace
consolidation.

Upper Abdomen: No acute abnormalities present in the visualized
portions of the upper abdomen.

Musculoskeletal: No chest wall abnormality. No acute or significant
osseous findings.

Review of the MIP images confirms the above findings.
IMPRESSION: No central, segmental, or subsegmental pulmonary embolism.

Mild cardiomegaly

Hazy airspace opacity at the posterior left lung base which could be
due to atelectasis and/or infectious etiology.

## 2020-11-06 MED ORDER — SODIUM CHLORIDE (PF) 0.9 % IJ SOLN
INTRAMUSCULAR | Status: AC
  Filled 2020-11-06: qty 50

## 2020-11-06 MED ORDER — IOHEXOL 350 MG/ML SOLN
100.0000 mL | Freq: Once | INTRAVENOUS | Status: AC | PRN
  Administered 2020-11-06: 80 mL via INTRAVENOUS

## 2020-11-06 NOTE — Discharge Instructions (Addendum)
You were seen today for shortness of breath.  Your work-up is reassuring.  Continue your heart failure medications as prescribed previously.

## 2020-11-06 NOTE — ED Provider Notes (Signed)
  Patient signed out pending imaging and lab work.  Reportedly evaluated for shortness of breath.  No respiratory distress on exam with clear lungs.  Chest x-ray was negative.  PE study and lab work pending.  Labs reviewed and largely at the patient's baseline.  Delta troponin is is elevated but stable and flat.  CT PE study obtained and reviewed by myself.  No obvious PE.  He has some atelectasis versus infection.  However, he does not have a fever and no leukocytosis.  Favor atelectasis.  BNP is reassuring.  We will plan for discharge.  Recommend continuing heart failure medications as previously prescribed.  Physical Exam  BP (!) 145/125   Pulse 98   Temp 98.1 F (36.7 C) (Oral)   Resp (!) 25   Ht 1.753 m (5\' 9" )   Wt 86.2 kg   SpO2 99%   BMI 28.06 kg/m     , MD 11/06/20 [phone]

## 2020-11-07 ENCOUNTER — Other Ambulatory Visit: Payer: Self-pay

## 2020-11-07 ENCOUNTER — Emergency Department (HOSPITAL_COMMUNITY): Payer: Medicaid Other

## 2020-11-07 ENCOUNTER — Emergency Department (HOSPITAL_COMMUNITY)
Admission: EM | Admit: 2020-11-07 | Discharge: 2020-11-08 | Disposition: A | Discharge status: Home / Self Care | Payer: Medicaid Other | Attending: Emergency Medicine | Admitting: Emergency Medicine

## 2020-11-07 ENCOUNTER — Encounter (HOSPITAL_COMMUNITY): Payer: Self-pay | Admitting: *Deleted

## 2020-11-07 DIAGNOSIS — J441 Chronic obstructive pulmonary disease with (acute) exacerbation: Secondary | ICD-10-CM | POA: Diagnosis not present

## 2020-11-07 DIAGNOSIS — F17210 Nicotine dependence, cigarettes, uncomplicated: Secondary | ICD-10-CM | POA: Diagnosis not present

## 2020-11-07 DIAGNOSIS — Z9119 Patient's noncompliance with other medical treatment and regimen: Secondary | ICD-10-CM | POA: Diagnosis not present

## 2020-11-07 DIAGNOSIS — R0602 Shortness of breath: Secondary | ICD-10-CM | POA: Diagnosis present

## 2020-11-07 DIAGNOSIS — Z7951 Long term (current) use of inhaled steroids: Secondary | ICD-10-CM | POA: Diagnosis not present

## 2020-11-07 DIAGNOSIS — J449 Chronic obstructive pulmonary disease, unspecified: Secondary | ICD-10-CM | POA: Insufficient documentation

## 2020-11-07 DIAGNOSIS — I10 Essential (primary) hypertension: Secondary | ICD-10-CM

## 2020-11-07 DIAGNOSIS — Z760 Encounter for issue of repeat prescription: Secondary | ICD-10-CM | POA: Insufficient documentation

## 2020-11-07 DIAGNOSIS — I110 Hypertensive heart disease with heart failure: Secondary | ICD-10-CM | POA: Diagnosis not present

## 2020-11-07 DIAGNOSIS — R609 Edema, unspecified: Secondary | ICD-10-CM

## 2020-11-07 DIAGNOSIS — Z79899 Other long term (current) drug therapy: Secondary | ICD-10-CM | POA: Insufficient documentation

## 2020-11-07 DIAGNOSIS — Z20822 Contact with and (suspected) exposure to covid-19: Secondary | ICD-10-CM | POA: Insufficient documentation

## 2020-11-07 DIAGNOSIS — I5043 Acute on chronic combined systolic (congestive) and diastolic (congestive) heart failure: Secondary | ICD-10-CM | POA: Insufficient documentation

## 2020-11-07 DIAGNOSIS — J45909 Unspecified asthma, uncomplicated: Secondary | ICD-10-CM | POA: Insufficient documentation

## 2020-11-07 DIAGNOSIS — Z9114 Patient's other noncompliance with medication regimen: Secondary | ICD-10-CM

## 2020-11-07 LAB — BASIC METABOLIC PANEL
Anion gap: 13 (ref 5–15)
BUN: 17 mg/dL (ref 6–20)
CO2: 23 mmol/L (ref 22–32)
Calcium: 8.9 mg/dL (ref 8.9–10.3)
Chloride: 102 mmol/L (ref 98–111)
Creatinine, Ser: 1.28 mg/dL — ABNORMAL HIGH (ref 0.61–1.24)
GFR, Estimated: >60 mL/min (ref >60)
Glucose, Bld: 84 mg/dL (ref 70–99)
Potassium: 4.4 mmol/L (ref 3.5–5.1)
Sodium: 138 mmol/L (ref 135–145)

## 2020-11-07 LAB — CBC
HCT: 44.8 % (ref 39.0–52.0)
Hemoglobin: 14.4 g/dL (ref 13.0–17.0)
MCH: 32.5 pg (ref 26.0–34.0)
MCHC: 32.1 g/dL (ref 30.0–36.0)
MCV: 101.1 fL — ABNORMAL HIGH (ref 80.0–100.0)
Platelets: 250 10*3/uL (ref 150–400)
RBC: 4.43 MIL/uL (ref 4.22–5.81)
RDW: 15.3 % (ref 11.5–15.5)
WBC: 9.6 10*3/uL (ref 4.0–10.5)
nRBC: 0 % (ref 0.0–0.2)

## 2020-11-07 LAB — TROPONIN I (HIGH SENSITIVITY)
Troponin I (High Sensitivity): 55 ng/L — ABNORMAL HIGH (ref <18)
Troponin I (High Sensitivity): 54 ng/L — ABNORMAL HIGH (ref <18)

## 2020-11-07 LAB — RESP PANEL BY RT-PCR (FLU A&B, COVID) ARPGX2
Influenza A by PCR: NEGATIVE
Influenza B by PCR: NEGATIVE
SARS Coronavirus 2 by RT PCR: NEGATIVE

## 2020-11-07 IMAGING — CR DG CHEST 2V
2 series · 3 of 3 positions shown · non-contrast
Comparison: CT of the chest from yesterday

CLINICAL DATA: Panic attack

EXAM:
CHEST - 2 VIEW

[chest pa]
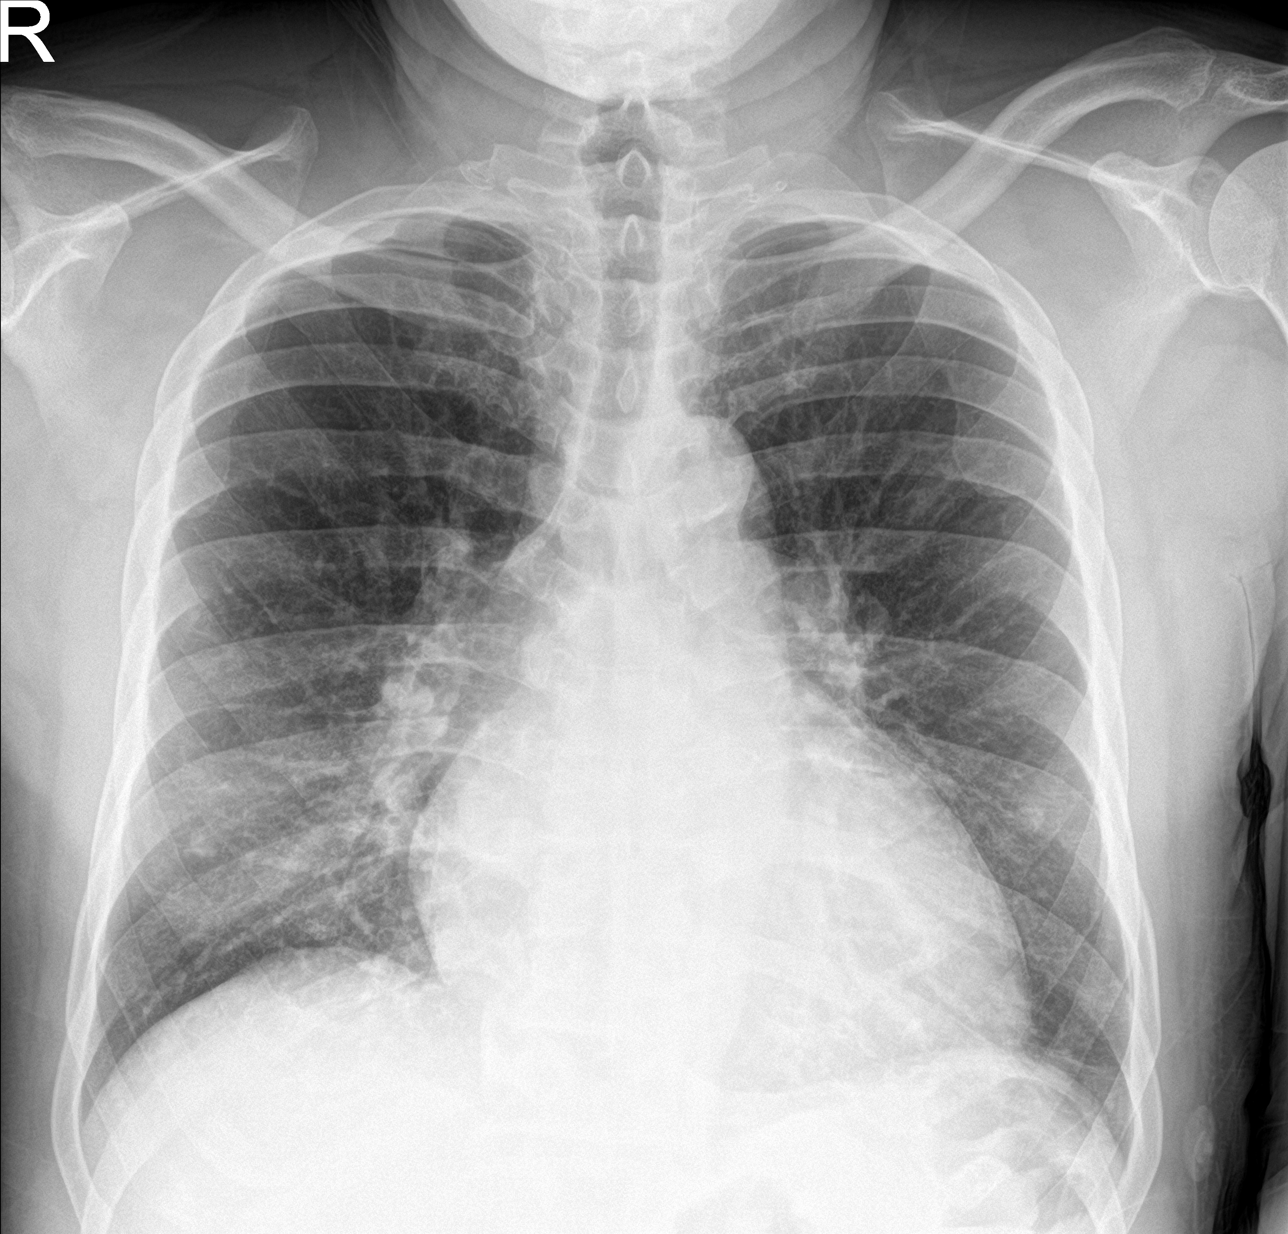

[Series 2: chest lat · 0.14mm/px · 2 of 2 slices shown]
[im 1/2]
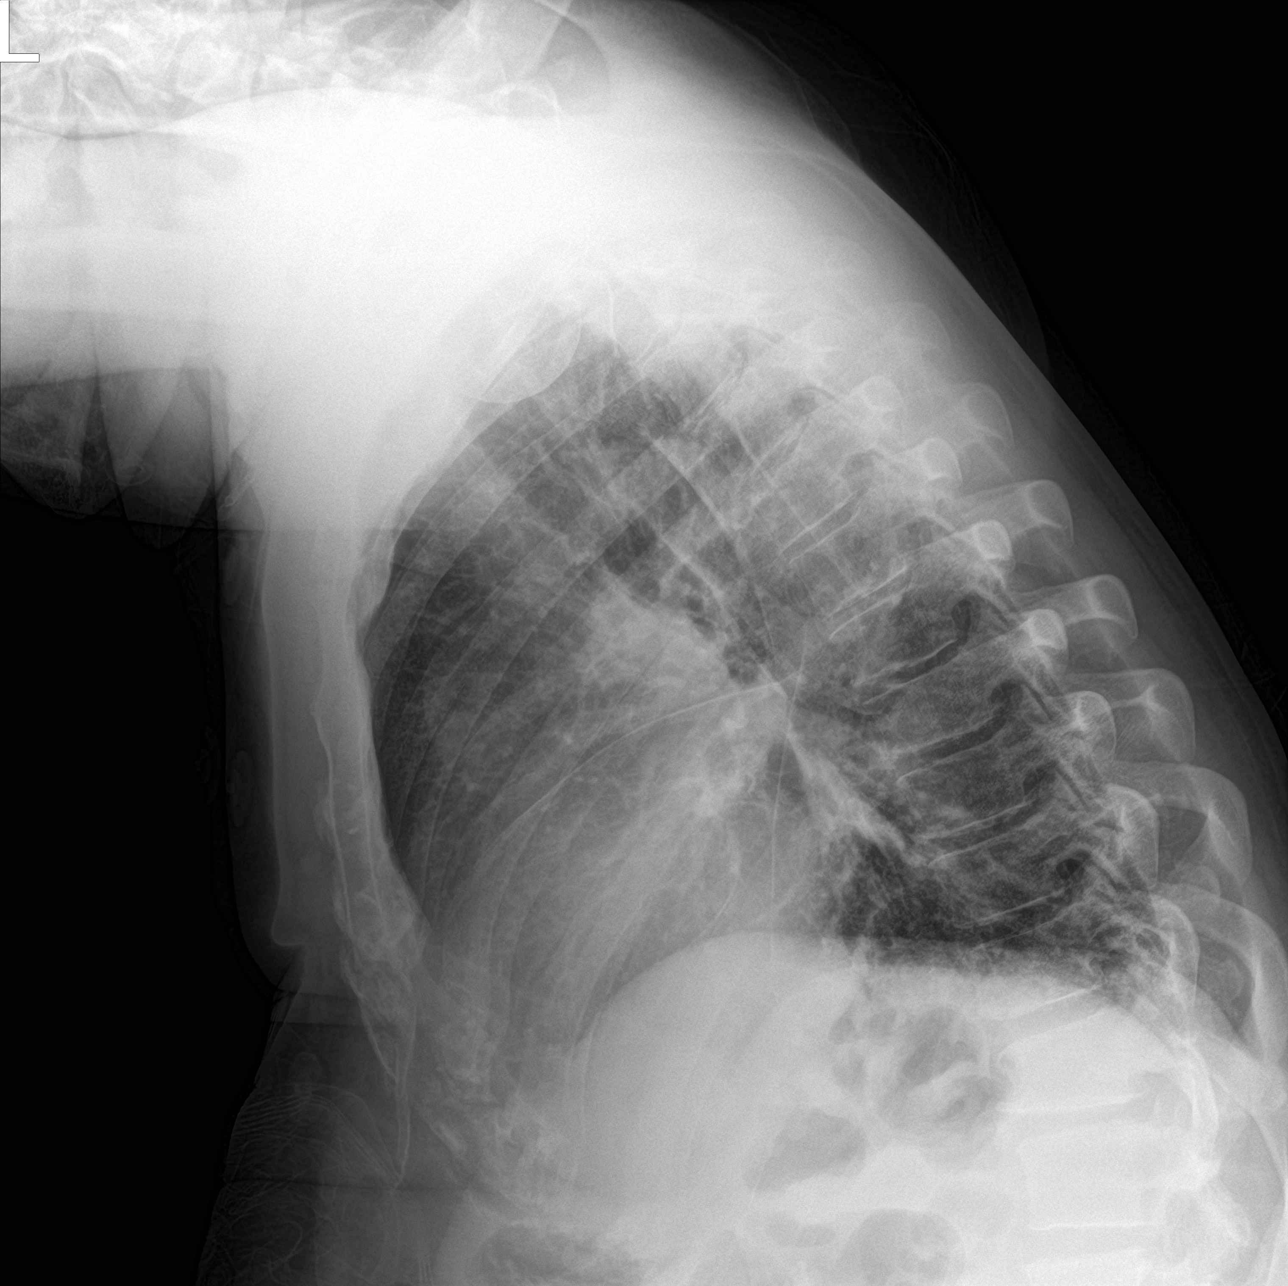
[im 2/2]
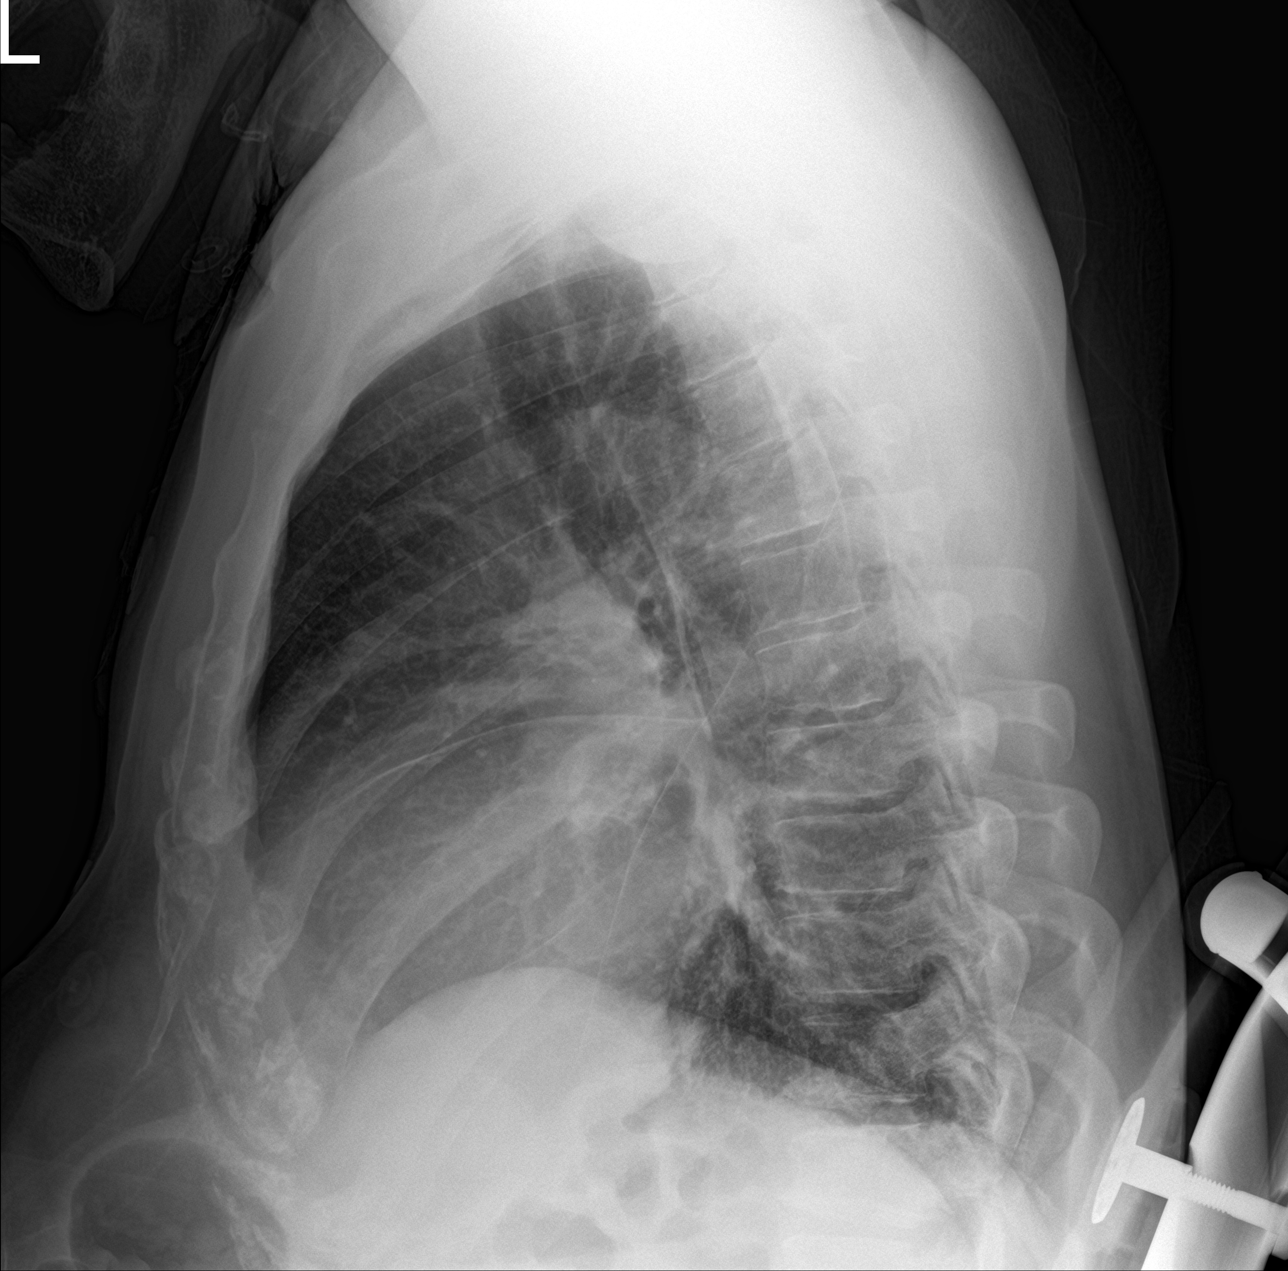

[3 of 3 positions shown; findings below may reference images not displayed]

FINDINGS: Cardiomegaly. Unremarkable aortic and hilar contours. Interstitial
coarsening correlating with emphysema. Bilateral nipple shadows. No
visible effusion or pneumothorax.
IMPRESSION: 1. No acute finding.
2. Cardiomegaly and emphysema.

## 2020-11-07 MED ORDER — AEROCHAMBER PLUS FLO-VU MEDIUM MISC
1.0000 | Freq: Once | Status: DC
  Filled 2020-11-07: qty 1

## 2020-11-07 MED ORDER — CLOPIDOGREL BISULFATE 75 MG PO TABS
75.0000 mg | ORAL_TABLET | Freq: Every day | ORAL | 0 refills | Status: DC
Start: 2020-11-07 — End: 2020-11-16

## 2020-11-07 MED ORDER — LOSARTAN POTASSIUM 50 MG PO TABS
50.0000 mg | ORAL_TABLET | Freq: Once | ORAL | Status: AC
  Administered 2020-11-07: 50 mg via ORAL
  Filled 2020-11-07: qty 1

## 2020-11-07 MED ORDER — HYDRALAZINE HCL 50 MG PO TABS
50.0000 mg | ORAL_TABLET | Freq: Three times a day (TID) | ORAL | Status: DC
  Administered 2020-11-07: 50 mg via ORAL
  Filled 2020-11-07: qty 2

## 2020-11-07 MED ORDER — ATORVASTATIN CALCIUM 20 MG PO TABS
20.0000 mg | ORAL_TABLET | Freq: Every day | ORAL | 0 refills | Status: DC
Start: 2020-11-07 — End: 2020-11-16

## 2020-11-07 MED ORDER — DEXAMETHASONE SODIUM PHOSPHATE 10 MG/ML IJ SOLN
10.0000 mg | Freq: Once | INTRAMUSCULAR | Status: AC
  Administered 2020-11-07: 10 mg via INTRAMUSCULAR
  Filled 2020-11-07: qty 1

## 2020-11-07 MED ORDER — CARVEDILOL 3.125 MG PO TABS: 3.1250 mg | ORAL_TABLET | Freq: Two times a day (BID) | ORAL | Status: DC

## 2020-11-07 MED ORDER — CLONIDINE HCL 0.1 MG PO TABS
0.1000 mg | ORAL_TABLET | Freq: Three times a day (TID) | ORAL | 0 refills | Status: DC
Start: 2020-11-07 — End: 2020-11-16

## 2020-11-07 MED ORDER — CARVEDILOL 3.125 MG PO TABS
3.1250 mg | ORAL_TABLET | Freq: Two times a day (BID) | ORAL | Status: DC
  Administered 2020-11-07: 3.125 mg via ORAL
  Filled 2020-11-07: qty 1

## 2020-11-07 MED ORDER — CARVEDILOL 3.125 MG PO TABS
3.1250 mg | ORAL_TABLET | Freq: Two times a day (BID) | ORAL | 0 refills | Status: DC
Start: 2020-11-07 — End: 2020-11-16

## 2020-11-07 MED ORDER — HYDRALAZINE HCL 50 MG PO TABS
50.0000 mg | ORAL_TABLET | Freq: Three times a day (TID) | ORAL | 0 refills | Status: DC
Start: 2020-11-07 — End: 2020-11-16

## 2020-11-07 MED ORDER — CLOPIDOGREL BISULFATE 75 MG PO TABS
75.0000 mg | ORAL_TABLET | Freq: Every day | ORAL | Status: DC
  Administered 2020-11-07: 75 mg via ORAL
  Filled 2020-11-07: qty 1

## 2020-11-07 MED ORDER — DIVALPROEX SODIUM ER 500 MG PO TB24
500.0000 mg | ORAL_TABLET | Freq: Two times a day (BID) | ORAL | 0 refills | Status: DC
Start: 2020-11-07 — End: 2020-11-16

## 2020-11-07 MED ORDER — ATORVASTATIN CALCIUM 20 MG PO TABS
20.0000 mg | ORAL_TABLET | Freq: Every day | ORAL | 0 refills | Status: DC
Start: 2020-11-07 — End: 2020-11-07

## 2020-11-07 MED ORDER — AEROCHAMBER PLUS FLO-VU LARGE MISC: 1.0000 | Freq: Once | Status: DC

## 2020-11-07 MED ORDER — FUROSEMIDE 40 MG PO TABS: 40.0000 mg | ORAL_TABLET | Freq: Every day | ORAL | 0 refills | Status: DC

## 2020-11-07 MED ORDER — CLONIDINE HCL 0.1 MG PO TABS
0.1000 mg | ORAL_TABLET | Freq: Three times a day (TID) | ORAL | Status: DC
  Administered 2020-11-07: 0.1 mg via ORAL
  Filled 2020-11-07: qty 1

## 2020-11-07 MED ORDER — FUROSEMIDE 20 MG PO TABS
40.0000 mg | ORAL_TABLET | Freq: Every day | ORAL | Status: DC
  Administered 2020-11-07: 40 mg via ORAL
  Filled 2020-11-07: qty 2

## 2020-11-07 MED ORDER — ATORVASTATIN CALCIUM 10 MG PO TABS: 20.0000 mg | ORAL_TABLET | Freq: Every day | ORAL | Status: DC

## 2020-11-07 MED ORDER — DIVALPROEX SODIUM ER 500 MG PO TB24: 500.0000 mg | ORAL_TABLET | Freq: Two times a day (BID) | ORAL | Status: DC

## 2020-11-07 MED ORDER — FUROSEMIDE 20 MG PO TABS
40.0000 mg | ORAL_TABLET | Freq: Once | ORAL | Status: AC
  Administered 2020-11-07: 40 mg via ORAL
  Filled 2020-11-07: qty 2

## 2020-11-07 MED ORDER — ALBUTEROL SULFATE HFA 108 (90 BASE) MCG/ACT IN AERS: 2.0000 | INHALATION_SPRAY | Freq: Four times a day (QID) | RESPIRATORY_TRACT | Status: DC | PRN

## 2020-11-07 MED ORDER — ATORVASTATIN CALCIUM 10 MG PO TABS
20.0000 mg | ORAL_TABLET | Freq: Every day | ORAL | Status: DC
  Administered 2020-11-07: 20 mg via ORAL
  Filled 2020-11-07: qty 2

## 2020-11-07 NOTE — ED Provider Notes (Addendum)
Pt was up for d/c.  He refused to go because he said he could not walk.  He is moving the extremities fine.  I will have pt/ot eval.     Jacalyn Lefevre, MD 11/07/20 1427  Pt did not cooperate with pt/ot eval.  He told the physical therapist that his wheelchair was at [facility] center.  I asked him why and he said it was not there.  Pt is not cooperating with anything we try to help him with.  Pt is stable for d/c.     Jacalyn Lefevre, MD 11/07/20 [phone]

## 2020-11-07 NOTE — Discharge Instructions (Addendum)
You need to take your medications to prevent heart attacks, strokes, shortness of breath.

## 2020-11-07 NOTE — Discharge Planning (Signed)
RNCM consulted in regards to medication assistance.  Pt is not eligible for Medication Assistance Through [facility]) program as he was enrolled 07/2020.  RNCM spoke with pt at bedside regarding going to [facility]) to obtain medical treatment and Rx.  Pt verbalized understanding.  No further CM needs communicated at this time.

## 2020-11-07 NOTE — ED Triage Notes (Signed)
Pt arrives via [facility], pt seen by fire and PD after they were trying to get pt leave where he was sitting outside a bar and then pt started to  c/o panic attack. Lung sounds are clear. Swelling in legs. sats 99% RA. 190 palpated BP. HR 90.

## 2020-11-07 NOTE — Discharge Planning (Signed)
RNCM consulted regarding homeless, uninsured pt needing medication assistance.  RNCM advised to have pt go to [facility] for a one time free Rx fill and to register with [facility] medical team for consistent medical care and Rx needs. EDCM contacted Mary Ann [name], NP with [facility] to alert her of pt arrival.  

## 2020-11-07 NOTE — ED Provider Notes (Signed)
[facility] EMERGENCY DEPARTMENT Provider Note   CSN: [id number] Arrival date & time: 11/07/20  0330     History Chief Complaint  Patient presents with  . Shortness of Breath    [name] is a 54 y.o. male.  Pt presents to the ED today with sob.  Pt said he's been using his inhalers without improvement in sx.  The pt has noticed some swelling to his legs.   Pt was admitted on 11/26 for a COPD exacerbation.  He left AMA on 12/1.  He has not been taking any of his medicines at home other than his inhalers.  Pt was seen overnight in the ED on 12/6 and d/c on 12/7.  He had a CTA which showed no PE.  The pt was told to take his medications, but does not have any.          Past Medical History:  Diagnosis Date  . Asthma   . Bipolar 1 disorder (HCC)   . CHF (congestive heart failure) (HCC)   . COPD (chronic obstructive pulmonary disease) (HCC)   . Depression   . Diabetes mellitus without complication (HCC)   . GERD (gastroesophageal reflux disease)   . Homelessness   . Hypertension   . QT prolongation   . Schizoaffective disorder [facility])     Patient Active Problem List   Diagnosis Date Noted  . Hypokalemia   . Hyperlipidemia   . Acute respiratory failure with hypoxia (HCC) 10/26/2020  . COPD with acute exacerbation (HCC) 10/14/2020  . Acute on chronic combined systolic and diastolic CHF (congestive heart failure) (HCC) 10/02/2020  . Near syncope 07/21/2020  . Schizoaffective disorder (HCC) 06/24/2019  . SOB (shortness of breath)   . QT prolongation 05/25/2019  . Cocaine abuse (HCC)   . Elevated troponin   . Homelessness   . Suicidal ideation   . Bilateral foot pain 04/05/2019  . Housing situation unstable 03/28/2019  . Bilateral lower extremity edema   . Asthma 12/26/2016  . Tobacco use disorder 12/26/2016  . Schizophrenia (HCC) 12/26/2016  . Cocaine abuse with cocaine-induced mood disorder (HCC) 09/06/2016  . Alcohol use disorder, mild, abuse  07/31/2016  . Substance induced mood disorder (HCC) 07/30/2016  . Schizoaffective disorder, bipolar type (HCC) 05/31/2016  . Cocaine use disorder, severe, dependence (HCC) 05/31/2016  . Essential hypertension 05/31/2016    History reviewed. No pertinent surgical history.     Family History  Problem Relation Age of Onset  . Drug abuse Mother   . Suicidality Mother     Social History   Tobacco Use  . Smoking status: Current Every Day Smoker    Years: 30.00    Types: Cigarettes  . Smokeless tobacco: Never Used  Vaping Use  . Vaping Use: Never used  Substance Use Topics  . Alcohol use: Yes    Comment: infrequently  . Drug use: Yes    Types: Cocaine, Marijuana    Home Medications Prior to Admission medications   Medication Sig Start Date End Date Taking? Authorizing Provider  albuterol (VENTOLIN HFA) 108 (90 Base) MCG/ACT inhaler Inhale 2 puffs into the lungs every 6 (six) hours as needed for wheezing or shortness of breath. 09/26/20   Leatha Gilding, MD  atorvastatin (LIPITOR) 20 MG tablet Take 1 tablet (20 mg total) by mouth daily. 11/07/20   Jacalyn Lefevre, MD  carvedilol (COREG) 3.125 MG tablet Take 1 tablet (3.125 mg total) by mouth 2 (two) times daily with a meal.  11/07/20   Jacalyn Lefevre, MD  cloNIDine (CATAPRES) 0.1 MG tablet Take 1 tablet (0.1 mg total) by mouth 3 (three) times daily. 11/07/20   Jacalyn Lefevre, MD  clopidogrel (PLAVIX) 75 MG tablet Take 1 tablet (75 mg total) by mouth daily. 11/07/20   Jacalyn Lefevre, MD  divalproex (DEPAKOTE ER) 500 MG 24 hr tablet Take 1 tablet (500 mg total) by mouth in the morning and at bedtime. 11/07/20   Jacalyn Lefevre, MD  doxycycline (VIBRA-TABS) 100 MG tablet Take 100 mg by mouth 2 (two) times daily.    [provider]  furosemide (LASIX) 40 MG tablet Take 1 tablet (40 mg total) by mouth daily. 11/07/20 12/07/20  Jacalyn Lefevre, MD  gabapentin (NEURONTIN) 100 MG capsule Take 2 capsules (200 mg total) by mouth 2  (two) times daily. 10/07/20   Marguerita Merles Latif, DO  hydrALAZINE (APRESOLINE) 50 MG tablet Take 1 tablet (50 mg total) by mouth every 8 (eight) hours. 11/07/20   Jacalyn Lefevre, MD  isosorbide mononitrate (IMDUR) 30 MG 24 hr tablet Take 1 tablet (30 mg total) by mouth daily. 10/07/20 11/06/20  Marguerita Merles Latif, DO  lisinopril (ZESTRIL) 20 MG tablet Take 20 mg by mouth in the morning and at bedtime.    [provider]  losartan (COZAAR) 50 MG tablet Take 1 tablet (50 mg total) by mouth daily. 10/19/20 11/18/20  Jerald Kief, MD  neomycin-bacitracin-polymyxin (NEOSPORIN) ointment Apply topically 2 (two) times daily. Patient taking differently: Apply 1 application topically 2 (two) times daily.  09/26/20   Leatha Gilding, MD  senna-docusate (SENOKOT-S) 8.6-50 MG tablet Take 1 tablet by mouth at bedtime as needed for mild constipation. 10/07/20   Marguerita Merles Latif, DO  spironolactone (ALDACTONE) 25 MG tablet Take 0.5 tablets (12.5 mg total) by mouth daily. 10/19/20 11/18/20  Jerald Kief, MD  torsemide (DEMADEX) 20 MG tablet Take 20 mg by mouth 2 (two) times daily.    [provider]  traZODone (DESYREL) 50 MG tablet Take 50 mg by mouth at bedtime.    [provider]  umeclidinium bromide (INCRUSE ELLIPTA) 62.5 MCG/INH AEPB Inhale 1 puff into the lungs daily.    [provider]    Allergies    Ibuprofen and Acetaminophen  Review of Systems   Review of Systems  Respiratory: Positive for cough and shortness of breath.   All other systems reviewed and are negative.   Physical Exam Updated Vital Signs BP (!) 171/109 (BP Location: Left Arm)   Pulse 71   Temp 98.9 F (37.2 C) (Oral)   Resp 15   SpO2 97%   Physical Exam Vitals and nursing note reviewed.  Constitutional:      Appearance: He is well-developed.  HENT:     Head: Normocephalic and atraumatic.     Mouth/Throat:     Mouth: Mucous membranes are moist.     Pharynx: Oropharynx is  clear.  Eyes:     Extraocular Movements: Extraocular movements intact.     Pupils: Pupils are equal, round, and reactive to light.  Cardiovascular:     Rate and Rhythm: Normal rate and regular rhythm.  Pulmonary:     Effort: Pulmonary effort is normal.     Breath sounds: Wheezing present.  Abdominal:     General: Bowel sounds are normal.     Palpations: Abdomen is soft.  Musculoskeletal:     Cervical back: Normal range of motion and neck supple.     Right lower  leg: Edema present.     Left lower leg: Edema present.  Skin:    General: Skin is warm.     Capillary Refill: Capillary refill takes less than 2 seconds.  Neurological:     General: No focal deficit present.     Mental Status: He is alert and oriented to person, place, and time.     ED Results / Procedures / Treatments   Labs (all labs ordered are listed, but only abnormal results are displayed) Labs Reviewed  BASIC METABOLIC PANEL - Abnormal; Notable for the following components:      Result Value   Creatinine, Ser 1.28 (*)    All other components within normal limits  CBC - Abnormal; Notable for the following components:   MCV 101.1 (*)    All other components within normal limits  TROPONIN I (HIGH SENSITIVITY) - Abnormal; Notable for the following components:   Troponin I (High Sensitivity) 55 (*)    All other components within normal limits  TROPONIN I (HIGH SENSITIVITY) - Abnormal; Notable for the following components:   Troponin I (High Sensitivity) 54 (*)    All other components within normal limits  RESP PANEL BY RT-PCR (FLU A&B, COVID) [id number]    EKG EKG Interpretation  Date/Time:  Wednesday November 07 2020 03:43:48 EST Ventricular Rate:  93 PR Interval:  152 QRS Duration: 86 QT Interval:  408 QTC Calculation: 507 R Axis:   68 Text Interpretation: Sinus rhythm with occasional Premature ventricular complexes Biatrial enlargement Minimal voltage criteria for LVH, may be normal variant ( [employer]  product ) Nonspecific ST abnormality Prolonged QT Abnormal ECG No significant change since last tracing Confirmed by Jacalyn Lefevre [phone]) on 11/07/2020 9:58:53 AM   Radiology DG Chest 2 View  Result Date: 11/07/2020 CLINICAL DATA:  Panic attack EXAM: CHEST - 2 VIEW COMPARISON:  CT of the chest from yesterday FINDINGS: Cardiomegaly. Unremarkable aortic and hilar contours. Interstitial coarsening correlating with emphysema. Bilateral nipple shadows. No visible effusion or pneumothorax. IMPRESSION: 1. No acute finding. 2. Cardiomegaly and emphysema. Electronically Signed   By: Marnee Spring M.D.   On: 11/07/2020 04:53   DG Chest 2 View  Result Date: 11/05/2020 CLINICAL DATA:  Shortness of breath for 2 days. History of congestive heart failure. EXAM: CHEST - 2 VIEW COMPARISON:  Radiographs 10/31/2020 and 10/14/2020.  CT 06/21/2019. FINDINGS: The heart size and mediastinal contours are stable with mild cardiomegaly and aortic atherosclerosis. The pulmonary vascularity is normal. There is stable fissural and interstitial thickening at both lung bases. No edema, confluent airspace opacity, significant pleural effusion or pneumothorax. The bones appear unchanged. IMPRESSION: Stable cardiomegaly and interstitial thickening at both lung bases. No acute cardiopulmonary process. Electronically Signed   By: Carey Bullocks M.D.   On: 11/05/2020 19:21   CT Angio Chest PE W/Cm &/Or Wo Cm  Result Date: 11/06/2020 CLINICAL DATA:  Productive cough EXAM: CT ANGIOGRAPHY CHEST WITH CONTRAST TECHNIQUE: Multidetector CT imaging of the chest was performed using the standard protocol during bolus administration of intravenous contrast. Multiplanar CT image reconstructions and MIPs were obtained to evaluate the vascular anatomy. CONTRAST:  69mL OMNIPAQUE IOHEXOL 350 MG/ML SOLN COMPARISON:  None. FINDINGS: Cardiovascular: The study is somewhat limited due to respiratory artifact. There is a optimal opacification of the  pulmonary arteries. There is no central,segmental, or subsegmental filling defects within the pulmonary arteries. There is mild cardiomegaly. No pericardial effusion or thickening. No evidence right heart strain. There is normal three-vessel brachiocephalic anatomy without  proximal stenosis. Scattered mild aortic atherosclerosis is seen. Mediastinum/Nodes: No hilar, mediastinal, or axillary adenopathy. Thyroid gland, trachea, and esophagus demonstrate no significant findings. Lungs/Pleura: Hazy airspace opacity seen at the posterior left lung base. No pleural effusion or pneumothorax. No airspace consolidation. Upper Abdomen: No acute abnormalities present in the visualized portions of the upper abdomen. Musculoskeletal: No chest wall abnormality. No acute or significant osseous findings. Review of the MIP images confirms the above findings. IMPRESSION: No central, segmental, or subsegmental pulmonary embolism. Mild cardiomegaly Hazy airspace opacity at the posterior left lung base which could be due to atelectasis and/or infectious etiology. Electronically Signed   By: Jonna Clark M.D.   On: 11/06/2020 01:32    Procedures Procedures (including critical care time)  Medications Ordered in ED Medications  carvedilol (COREG) tablet 3.125 mg (3.125 mg Oral Given 11/07/20 1116)  AeroChamber Plus Flo-Vu Large MISC 1 each (has no administration in time range)  dexamethasone (DECADRON) injection 10 mg (10 mg Intramuscular Given 11/07/20 1116)  furosemide (LASIX) tablet 40 mg (40 mg Oral Given 11/07/20 1116)  losartan (COZAAR) tablet 50 mg (50 mg Oral Given 11/07/20 1116)    ED Course  I have reviewed the triage vital signs and the nursing notes.  Pertinent labs & imaging results that were available during my care of the patient were reviewed by me and considered in my medical decision making (see chart for details).    MDM Rules/Calculators/A&P                          Pt looks nontoxic.  He is  breathing well.  He is told to use the spacer with his inhaler to make it more effective.  He is given his bp meds in the ED.  I spoke with sw to see about helping him with meds and pcp.  She is familiar with patient and has made him several appointments and he never goes to the appt.  He can go to [facility] pharm for a 1 time free refill and is told to register with the [facility].  Pt said we have not done anything for him and he is upset, but there is no indication for admission.  He needs to establish primary care and go to appointments and take his medications.  Final Clinical Impression(s) / ED Diagnoses Final diagnoses:  COPD exacerbation (HCC)  Peripheral edema  Noncompliance with medications  Primary hypertension  Medication refill    Rx / DC Orders ED Discharge Orders         Ordered    atorvastatin (LIPITOR) 20 MG tablet  Daily        11/07/20 1157    carvedilol (COREG) 3.125 MG tablet  2 times daily with meals        11/07/20 1157    cloNIDine (CATAPRES) 0.1 MG tablet  3 times daily        11/07/20 1157    clopidogrel (PLAVIX) 75 MG tablet  Daily        11/07/20 1157    divalproex (DEPAKOTE ER) 500 MG 24 hr tablet  2 times daily        11/07/20 1157    hydrALAZINE (APRESOLINE) 50 MG tablet  Every 8 hours        11/07/20 1157    furosemide (LASIX) 40 MG tablet  Daily        11/07/20 1157           Kimball Appleby,  Raynelle Fanning, MD 11/07/20 1158

## 2020-11-07 NOTE — ED Triage Notes (Signed)
Pt says he has felt SOB for several days, his inhalers are not working. Has been out of all his other medications. He has noted increased swelling in his legs.Two days ago he coughed up some blood.

## 2020-11-07 NOTE — Evaluation (Signed)
Physical Therapy Evaluation and Discharge Patient Details Name: [name] MRN: [id number] DOB: 09-Oct-1966 Today's Date: 11/07/2020   History of Present Illness  Pt is a 54 y/o male presenting to the ED following anxiety attack and SOB. Pt also complaining of bilateral feet pain and that he is unable to walk. PMH includes COPD, bipoloar disorder, CHF, HTN, DM, and schizoaffective disorder.   Clinical Impression  Pt presenting secondary to problem above with deficits below. Pt very agitated and reporting multiple times that he could not walk because his feet were swollen and in pain. Pt impulsively sitting at EOB, but continuing to state he could not walk and would not stand. Did have pt demonstrate that he could laterally scoot if he chose to use a WC. When asking about his WC, pt reports he "KNOWS" it was at [facility]. Feel if pt is choosing not to ambulate, may benefit from use of his WC. Due to lack of participation and pt refusing therapy, will sign off. Please re-consult if needs change.     Follow Up Recommendations No PT follow up    Equipment Recommendations  Wheelchair (measurements PT);Wheelchair cushion (measurements PT) (reports his WC is at [facility])    Recommendations for Other Services       Precautions / Restrictions Precautions Precautions: None Restrictions Weight Bearing Restrictions: No      Mobility  Bed Mobility Overal bed mobility: Independent             General bed mobility comments: Pt impulsively sitting, but continued to refuse to stand.     Transfers Overall transfer level: Modified independent   Transfers: Lateral/Scoot Transfers          Lateral/Scoot Transfers: Modified independent (Device/Increase time) General transfer comment: Pt refusing to stand. Reports his feet are swollen and they hurt and that he "could not and would not stand". Had pt practice laterally scooting and pt did not require assist.    Ambulation/Gait                Stairs            Wheelchair Mobility    Modified Rankin (Stroke Patients Only)       Balance                                             Pertinent Vitals/Pain Pain Assessment: 0-10 Pain Score: 10-Worst pain ever (per pt report) Pain Location: bilateral feet Pain Descriptors / Indicators: Sharp Pain Intervention(s): Monitored during session;Limited activity within patient's tolerance;Repositioned    Home Living Family/patient expects to be discharged to:: Shelter/Homeless                      Prior Function Level of Independence: Independent         Comments: Reports he had WC, but it was at [facility] medical center per pt. Has been ambulating prior to presentation.      Hand Dominance        Extremity/Trunk Assessment   Upper Extremity Assessment Upper Extremity Assessment: Overall WFL for tasks assessed    Lower Extremity Assessment Lower Extremity Assessment: Overall WFL for tasks assessed;RLE deficits/detail;LLE deficits/detail RLE Deficits / Details: Pt reporting pain in bilateral feet. Noted Limited ankle ROM, however, otherwise able to move extremities without assist.  LLE Deficits / Details: Pt reporting  pain in bilateral feet. Noted Limited ankle ROM, however, otherwise able to move extremities without assist.     Cervical / Trunk Assessment Cervical / Trunk Assessment: Normal  Communication   Communication: No difficulties  Cognition Arousal/Alertness: Awake/alert Behavior During Therapy: Agitated;Impulsive Overall Cognitive Status: No family/caregiver present to determine baseline cognitive functioning                                 General Comments: Pt very agitated, reporting repeatedly "I can't walk, my feet are swollen". Would not let PT provide education about other mobility methods. Pt refusing to even attempt to stand.       General Comments General  comments (skin integrity, edema, etc.): When pt allowed PT to speak, PT asked about WC and pt reporting it was at [facility]. Notified case management, CSW, and MD.     Exercises     Assessment/Plan    PT Assessment Patent does not need any further PT services  PT Problem List         PT Treatment Interventions      PT Goals (Current goals can be found in the Care Plan section)  Acute Rehab PT Goals Patient Stated Goal: decrease pain  PT Goal Formulation: With patient Time For Goal Achievement: 11/07/20 Potential to Achieve Goals: Fair    Frequency     Barriers to discharge        Co-evaluation               AM-PAC PT "6 Clicks" Mobility  Outcome Measure Help needed turning from your back to your side while in a flat bed without using bedrails?: None Help needed moving from lying on your back to sitting on the side of a flat bed without using bedrails?: None Help needed moving to and from a bed to a chair (including a wheelchair)?: None Help needed standing up from a chair using your arms (e.g., wheelchair or bedside chair)?: None Help needed to walk in hospital room?: None Help needed climbing 3-5 steps with a railing? : A Little 6 Click Score: 23    End of Session   Activity Tolerance: Patient limited by pain;Treatment limited secondary to agitation Patient left: in bed;with call bell/phone within reach (on stretcher in ED ) Nurse Communication: Mobility status PT Visit Diagnosis: Pain Pain - Right/Left:  (bilateral )    Time: 9179-1505 PT Time Calculation (min) (ACUTE ONLY): 10 min   Charges:   PT Evaluation $PT Eval Moderate Complexity: 1 Mod          Cindee Salt, DPT  Acute Rehabilitation Services  Pager: [phone] Office: [phone]   Lehman Prom 11/07/2020, 3:54 PM

## 2020-11-07 NOTE — ED Notes (Signed)
Patient refusing leave department, states that he is unable to walk because his feet are swollen. Patient informed that he has been prescribed medications for the edema and was given a dose today, patient still refusing to leave department.

## 2020-11-10 ENCOUNTER — Emergency Department (HOSPITAL_COMMUNITY)
Admission: EM | Admit: 2020-11-10 | Discharge: 2020-11-10 | Disposition: A | Discharge status: Home / Self Care | Payer: Medicaid Other | Attending: Emergency Medicine | Admitting: Emergency Medicine

## 2020-11-10 ENCOUNTER — Encounter (HOSPITAL_COMMUNITY): Payer: Self-pay | Admitting: *Deleted

## 2020-11-10 ENCOUNTER — Other Ambulatory Visit: Payer: Self-pay

## 2020-11-10 DIAGNOSIS — K1379 Other lesions of oral mucosa: Secondary | ICD-10-CM | POA: Diagnosis present

## 2020-11-10 DIAGNOSIS — J449 Chronic obstructive pulmonary disease, unspecified: Secondary | ICD-10-CM | POA: Insufficient documentation

## 2020-11-10 DIAGNOSIS — K122 Cellulitis and abscess of mouth: Secondary | ICD-10-CM | POA: Diagnosis not present

## 2020-11-10 DIAGNOSIS — J45909 Unspecified asthma, uncomplicated: Secondary | ICD-10-CM | POA: Diagnosis not present

## 2020-11-10 DIAGNOSIS — I110 Hypertensive heart disease with heart failure: Secondary | ICD-10-CM | POA: Insufficient documentation

## 2020-11-10 DIAGNOSIS — E1169 Type 2 diabetes mellitus with other specified complication: Secondary | ICD-10-CM | POA: Insufficient documentation

## 2020-11-10 DIAGNOSIS — E785 Hyperlipidemia, unspecified: Secondary | ICD-10-CM | POA: Diagnosis not present

## 2020-11-10 DIAGNOSIS — I5043 Acute on chronic combined systolic (congestive) and diastolic (congestive) heart failure: Secondary | ICD-10-CM | POA: Insufficient documentation

## 2020-11-10 DIAGNOSIS — Z7951 Long term (current) use of inhaled steroids: Secondary | ICD-10-CM | POA: Diagnosis not present

## 2020-11-10 DIAGNOSIS — F17210 Nicotine dependence, cigarettes, uncomplicated: Secondary | ICD-10-CM | POA: Insufficient documentation

## 2020-11-10 MED ORDER — OXYCODONE-ACETAMINOPHEN 5-325 MG PO TABS
1.0000 | ORAL_TABLET | Freq: Once | ORAL | Status: DC
  Filled 2020-11-10: qty 1

## 2020-11-10 MED ORDER — OXYCODONE HCL 5 MG PO TABS
5.0000 mg | ORAL_TABLET | Freq: Once | ORAL | Status: AC
  Administered 2020-11-10: 5 mg via ORAL
  Filled 2020-11-10: qty 1

## 2020-11-10 MED ORDER — AMOXICILLIN 500 MG PO CAPS: 500.0000 mg | ORAL_CAPSULE | Freq: Three times a day (TID) | ORAL | 0 refills | Status: DC

## 2020-11-10 MED ORDER — LIDOCAINE-EPINEPHRINE 2 %-1:100000 IJ SOLN
20.0000 mL | Freq: Once | INTRAMUSCULAR | Status: AC
  Administered 2020-11-10: 20 mL
  Filled 2020-11-10: qty 1

## 2020-11-10 NOTE — ED Triage Notes (Addendum)
BIB EMS due to ? Mass on top of mouth. Sensitive to heat and cold.

## 2020-11-10 NOTE — ED Notes (Signed)
Called for room placement x1 with no answer. 

## 2020-11-10 NOTE — ED Notes (Signed)
Patient refusing ordered pain medication. Reports he has a tylenol allergy. MD made aware.

## 2020-11-10 NOTE — Discharge Instructions (Addendum)
The pain and swelling in your mouth was caused by an abscess near your teeth.  We have opened it up to help it drain.  We are giving you a prescription antibiotic to take in case you have persistent pain or swelling.  To help the wound heal, and encourage drainage, rinse your mouth out with warm water every 1-2 hours during the day.  Follow-up with your primary care doctor or dentist for further care and treatment as needed.

## 2020-11-10 NOTE — ED Notes (Addendum)
Patient wheeled himself out in wheelchair prior to getting discharge paperwork after he was provided with bus pass. Refused vital sign reassessment.

## 2020-11-10 NOTE — ED Provider Notes (Signed)
[facility]-EMERGENCY DEPT Provider Note   CSN: [id number] Arrival date & time: 11/10/20  1012     History Chief Complaint  Patient presents with  . Oral Pain    [name] is a 54 y.o. male.  HPI He presents for evaluation of swelling and pain in his mouth present for several days.  He denies known trauma.  He denies fever, chills, nausea or vomiting.  There are no other known modifying factors.    Past Medical History:  Diagnosis Date  . Asthma   . Bipolar 1 disorder (HCC)   . CHF (congestive heart failure) (HCC)   . COPD (chronic obstructive pulmonary disease) (HCC)   . Depression   . Diabetes mellitus without complication (HCC)   . GERD (gastroesophageal reflux disease)   . Homelessness   . Hypertension   . QT prolongation   . Schizoaffective disorder [facility])     Patient Active Problem List   Diagnosis Date Noted  . Hypokalemia   . Hyperlipidemia   . Acute respiratory failure with hypoxia (HCC) 10/26/2020  . COPD with acute exacerbation (HCC) 10/14/2020  . Acute on chronic combined systolic and diastolic CHF (congestive heart failure) (HCC) 10/02/2020  . Near syncope 07/21/2020  . Schizoaffective disorder (HCC) 06/24/2019  . SOB (shortness of breath)   . QT prolongation 05/25/2019  . Cocaine abuse (HCC)   . Elevated troponin   . Homelessness   . Suicidal ideation   . Bilateral foot pain 04/05/2019  . Housing situation unstable 03/28/2019  . Bilateral lower extremity edema   . Asthma 12/26/2016  . Tobacco use disorder 12/26/2016  . Schizophrenia (HCC) 12/26/2016  . Cocaine abuse with cocaine-induced mood disorder (HCC) 09/06/2016  . Alcohol use disorder, mild, abuse 07/31/2016  . Substance induced mood disorder (HCC) 07/30/2016  . Schizoaffective disorder, bipolar type (HCC) 05/31/2016  . Cocaine use disorder, severe, dependence (HCC) 05/31/2016  . Essential hypertension 05/31/2016    History reviewed. No pertinent surgical  history.     Family History  Problem Relation Age of Onset  . Drug abuse Mother   . Suicidality Mother     Social History   Tobacco Use  . Smoking status: Current Every Day Smoker    Years: 30.00    Types: Cigarettes  . Smokeless tobacco: Never Used  Vaping Use  . Vaping Use: Never used  Substance Use Topics  . Alcohol use: Yes    Comment: infrequently  . Drug use: Yes    Types: Cocaine, Marijuana    Home Medications Prior to Admission medications   Medication Sig Start Date End Date Taking? Authorizing Provider  albuterol (VENTOLIN HFA) 108 (90 Base) MCG/ACT inhaler Inhale 2 puffs into the lungs every 6 (six) hours as needed for wheezing or shortness of breath. 09/26/20   Leatha Gilding, MD  amoxicillin (AMOXIL) 500 MG capsule Take 1 capsule (500 mg total) by mouth 3 (three) times daily. 11/10/20   Mancel Bale, MD  atorvastatin (LIPITOR) 20 MG tablet Take 1 tablet (20 mg total) by mouth daily. 11/07/20   Jacalyn Lefevre, MD  carvedilol (COREG) 3.125 MG tablet Take 1 tablet (3.125 mg total) by mouth 2 (two) times daily with a meal. 11/07/20   Jacalyn Lefevre, MD  cloNIDine (CATAPRES) 0.1 MG tablet Take 1 tablet (0.1 mg total) by mouth 3 (three) times daily. 11/07/20   Jacalyn Lefevre, MD  clopidogrel (PLAVIX) 75 MG tablet Take 1 tablet (75 mg total) by mouth daily. 11/07/20  Jacalyn Lefevre, MD  divalproex (DEPAKOTE ER) 500 MG 24 hr tablet Take 1 tablet (500 mg total) by mouth in the morning and at bedtime. 11/07/20   Jacalyn Lefevre, MD  doxycycline (VIBRA-TABS) 100 MG tablet Take 100 mg by mouth 2 (two) times daily.    [provider]  furosemide (LASIX) 40 MG tablet Take 1 tablet (40 mg total) by mouth daily. 11/07/20 12/07/20  Jacalyn Lefevre, MD  gabapentin (NEURONTIN) 100 MG capsule Take 2 capsules (200 mg total) by mouth 2 (two) times daily. 10/07/20   Marguerita Merles Latif, DO  hydrALAZINE (APRESOLINE) 50 MG tablet Take 1 tablet (50 mg total) by mouth every 8  (eight) hours. 11/07/20   Jacalyn Lefevre, MD  isosorbide mononitrate (IMDUR) 30 MG 24 hr tablet Take 1 tablet (30 mg total) by mouth daily. 10/07/20 11/06/20  Marguerita Merles Latif, DO  lisinopril (ZESTRIL) 20 MG tablet Take 20 mg by mouth in the morning and at bedtime.    [provider]  losartan (COZAAR) 50 MG tablet Take 1 tablet (50 mg total) by mouth daily. 10/19/20 11/18/20  Jerald Kief, MD  neomycin-bacitracin-polymyxin (NEOSPORIN) ointment Apply topically 2 (two) times daily. Patient taking differently: Apply 1 application topically 2 (two) times daily.  09/26/20   Leatha Gilding, MD  senna-docusate (SENOKOT-S) 8.6-50 MG tablet Take 1 tablet by mouth at bedtime as needed for mild constipation. 10/07/20   Marguerita Merles Latif, DO  spironolactone (ALDACTONE) 25 MG tablet Take 0.5 tablets (12.5 mg total) by mouth daily. 10/19/20 11/18/20  Jerald Kief, MD  torsemide (DEMADEX) 20 MG tablet Take 20 mg by mouth 2 (two) times daily.    [provider]  traZODone (DESYREL) 50 MG tablet Take 50 mg by mouth at bedtime.    [provider]  umeclidinium bromide (INCRUSE ELLIPTA) 62.5 MCG/INH AEPB Inhale 1 puff into the lungs daily.    [provider]    Allergies    Ibuprofen and Acetaminophen  Review of Systems   Review of Systems  All other systems reviewed and are negative.   Physical Exam Updated Vital Signs BP (!) 174/115   Pulse (!) 108   Temp 98.4 F (36.9 C) (Oral)   Resp 20   SpO2 97%   Physical Exam Vitals and nursing note reviewed.  Constitutional:      Appearance: He is well-developed and well-nourished.  HENT:     Head: Normocephalic and atraumatic.     Right Ear: External ear normal.     Left Ear: External ear normal.     Nose: Nose normal.     Mouth/Throat:     Comments: Left lateral posterior aspect of the hard palate with a two 2 cm x 1.5 cm abscess, near the teeth.  There appears to be a superficial skin defect centrally,  that may be an injury, such as burn.  This abscess is fluctuant.  There is no trismus.  The oral airway is intact. Eyes:     Extraocular Movements: EOM normal.     Conjunctiva/sclera: Conjunctivae normal.     Pupils: Pupils are equal, round, and reactive to light.  Neck:     Trachea: Phonation normal.  Cardiovascular:     Rate and Rhythm: Normal rate.  Pulmonary:     Effort: Pulmonary effort is normal.  Chest:     Chest wall: No bony tenderness.  Abdominal:     Palpations: Abdomen is soft.     Tenderness: There is no  abdominal tenderness.  Musculoskeletal:        General: Normal range of motion.     Cervical back: Normal range of motion and neck supple.  Skin:    General: Skin is warm, dry and intact.  Neurological:     Mental Status: He is alert and oriented to person, place, and time.     Cranial Nerves: No cranial nerve deficit.     Sensory: No sensory deficit.     Motor: No abnormal muscle tone.     Coordination: Coordination normal.  Psychiatric:        Mood and Affect: Mood and affect and mood normal.        Behavior: Behavior normal.     ED Results / Procedures / Treatments   Labs (all labs ordered are listed, but only abnormal results are displayed) Labs Reviewed - No data to display  EKG None  Radiology No results found.  Procedures .Marland KitchenIncision and Drainage  Date/Time: 11/10/2020 1:16 PM Performed by: Mancel Bale, MD Authorized by: Mancel Bale, MD   Consent:    Consent obtained:  Verbal   Consent given by:  Patient   Risks, benefits, and alternatives were discussed: yes     Risks discussed:  Bleeding and incomplete drainage   Alternatives discussed:  No treatment Universal protocol:    Procedure explained and questions answered to patient or proxy's satisfaction: yes     Patient identity confirmed:  Verbally with patient Location:    Type:  Abscess   Location: Mouth. Sedation:    Sedation type:  None Anesthesia:    Anesthesia method:   Local infiltration   Local anesthetic:  Lidocaine 2% WITH epi Procedure type:    Complexity:  Simple Procedure details:    Ultrasound guidance: no     Needle aspiration: yes     Incision types:  Stab incision   Incision depth:  Submucosal   Drainage:  Purulent   Drainage amount:  Moderate   Wound treatment:  Wound left open   Packing materials:  None Post-procedure details:    Procedure completion:  Tolerated well, no immediate complications   (including critical care time)  Medications Ordered in ED Medications  oxyCODONE-acetaminophen (PERCOCET/ROXICET) 5-325 MG per tablet 1 tablet (1 tablet Oral Refused 11/10/20 1158)  lidocaine-EPINEPHrine (XYLOCAINE W/EPI) 2 %-1:100000 (with pres) injection 20 mL (20 mLs Infiltration Given by Other 11/10/20 1156)  oxyCODONE (Oxy IR/ROXICODONE) immediate release tablet 5 mg (5 mg Oral Given 11/10/20 1215)    ED Course  I have reviewed the triage vital signs and the nursing notes.  Pertinent labs & imaging results that were available during my care of the patient were reviewed by me and considered in my medical decision making (see chart for details).    MDM Rules/Calculators/A&P                           Patient Vitals for the past 24 hrs:  BP Temp Temp src Pulse Resp SpO2  11/10/20 1200 (!) 174/115 -- -- (!) 108 20 97 %  11/10/20 1137 (!) 172/129 -- -- 96 (!) 22 98 %  11/10/20 1019 (!) 182/143 98.4 F (36.9 C) Oral (!) 113 17 100 %      Medical Decision Making:  This patient is presenting for evaluation of swelling in mouth, which does require a range of treatment options, and is a complaint that involves a moderate risk of morbidity and mortality. The differential  diagnoses include dental abscess, oral abscess, deep tissue infection. I decided to review old records, and in summary patient was homeless, presenting for evaluation of swelling pain in mouth.  He appears to have an oral abscess.  I do not require additional  historical information from anyone.   Critical Interventions-clinical evaluation, I&D oral abscess  After These Interventions, the Patient was reevaluated and was found stable for discharge. Patient left the emergency department without discharge instructions or discharge prescription  CRITICAL CARE-no Performed by: Mancel Bale  Nursing Notes Reviewed/ Care Coordinated Applicable Imaging Reviewed Interpretation of Laboratory Data incorporated into ED treatment  The patient appears reasonably screened and/or stabilized for discharge and I doubt any other medical condition or other [facility] requiring further screening, evaluation, or treatment in the ED at this time prior to discharge.  Plan: Home Medications-continue routine medications; Home Treatments-oral care with warm water; return here if the recommended treatment, does not improve the symptoms; Recommended follow up-PCP, as needed     Final Clinical Impression(s) / ED Diagnoses Final diagnoses:  Oral abscess    Rx / DC Orders ED Discharge Orders         Ordered    amoxicillin (AMOXIL) 500 MG capsule  3 times daily        11/10/20 1325           Mancel Bale, MD 11/10/20 2101

## 2020-11-10 NOTE — ED Notes (Signed)
Patient provided with heat pack upon request.

## 2020-11-12 ENCOUNTER — Other Ambulatory Visit: Payer: Self-pay

## 2020-11-12 ENCOUNTER — Emergency Department (HOSPITAL_COMMUNITY)
Admission: EM | Admit: 2020-11-12 | Discharge: 2020-11-13 | Disposition: A | Discharge status: Home / Self Care | Payer: Medicaid Other | Source: Home / Self Care | Attending: Emergency Medicine | Admitting: Emergency Medicine

## 2020-11-12 ENCOUNTER — Encounter (HOSPITAL_COMMUNITY): Payer: Self-pay

## 2020-11-12 ENCOUNTER — Emergency Department (HOSPITAL_COMMUNITY): Payer: Medicaid Other

## 2020-11-12 DIAGNOSIS — F17210 Nicotine dependence, cigarettes, uncomplicated: Secondary | ICD-10-CM | POA: Insufficient documentation

## 2020-11-12 DIAGNOSIS — I110 Hypertensive heart disease with heart failure: Secondary | ICD-10-CM | POA: Insufficient documentation

## 2020-11-12 DIAGNOSIS — J45909 Unspecified asthma, uncomplicated: Secondary | ICD-10-CM | POA: Insufficient documentation

## 2020-11-12 DIAGNOSIS — I509 Heart failure, unspecified: Secondary | ICD-10-CM

## 2020-11-12 DIAGNOSIS — E119 Type 2 diabetes mellitus without complications: Secondary | ICD-10-CM | POA: Insufficient documentation

## 2020-11-12 DIAGNOSIS — J449 Chronic obstructive pulmonary disease, unspecified: Secondary | ICD-10-CM | POA: Insufficient documentation

## 2020-11-12 DIAGNOSIS — I5043 Acute on chronic combined systolic (congestive) and diastolic (congestive) heart failure: Secondary | ICD-10-CM | POA: Insufficient documentation

## 2020-11-12 DIAGNOSIS — R000 Tachycardia, unspecified: Secondary | ICD-10-CM | POA: Insufficient documentation

## 2020-11-12 DIAGNOSIS — R609 Edema, unspecified: Secondary | ICD-10-CM | POA: Insufficient documentation

## 2020-11-12 DIAGNOSIS — Z79899 Other long term (current) drug therapy: Secondary | ICD-10-CM | POA: Insufficient documentation

## 2020-11-12 DIAGNOSIS — R0602 Shortness of breath: Secondary | ICD-10-CM | POA: Insufficient documentation

## 2020-11-12 LAB — COMPREHENSIVE METABOLIC PANEL
ALT: 34 U/L (ref 0–44)
AST: 33 U/L (ref 15–41)
Albumin: 3.5 g/dL (ref 3.5–5.0)
Alkaline Phosphatase: 80 U/L (ref 38–126)
Anion gap: 10 (ref 5–15)
BUN: 16 mg/dL (ref 6–20)
CO2: 26 mmol/L (ref 22–32)
Calcium: 8.7 mg/dL — ABNORMAL LOW (ref 8.9–10.3)
Chloride: 105 mmol/L (ref 98–111)
Creatinine, Ser: 1.17 mg/dL (ref 0.61–1.24)
GFR, Estimated: >60 mL/min (ref >60)
Glucose, Bld: 86 mg/dL (ref 70–99)
Potassium: 3.9 mmol/L (ref 3.5–5.1)
Sodium: 141 mmol/L (ref 135–145)
Total Bilirubin: 1.1 mg/dL (ref 0.3–1.2)
Total Protein: 6.6 g/dL (ref 6.5–8.1)

## 2020-11-12 LAB — CBC WITH DIFFERENTIAL/PLATELET
Abs Immature Granulocytes: 0.03 10*3/uL (ref 0.00–0.07)
Basophils Absolute: 0 10*3/uL (ref 0.0–0.1)
Basophils Relative: 0 %
Eosinophils Absolute: 0 10*3/uL (ref 0.0–0.5)
Eosinophils Relative: 0 %
HCT: 40.5 % (ref 39.0–52.0)
Hemoglobin: 13.5 g/dL (ref 13.0–17.0)
Immature Granulocytes: 0 %
Lymphocytes Relative: 19 %
Lymphs Abs: 1.8 10*3/uL (ref 0.7–4.0)
MCH: 33.1 pg (ref 26.0–34.0)
MCHC: 33.3 g/dL (ref 30.0–36.0)
MCV: 99.3 fL (ref 80.0–100.0)
Monocytes Absolute: 0.6 10*3/uL (ref 0.1–1.0)
Monocytes Relative: 7 %
Neutro Abs: 6.7 10*3/uL (ref 1.7–7.7)
Neutrophils Relative %: 74 %
Platelets: 234 10*3/uL (ref 150–400)
RBC: 4.08 MIL/uL — ABNORMAL LOW (ref 4.22–5.81)
RDW: 15.6 % — ABNORMAL HIGH (ref 11.5–15.5)
WBC: 9.1 10*3/uL (ref 4.0–10.5)
nRBC: 0 % (ref 0.0–0.2)

## 2020-11-12 LAB — BRAIN NATRIURETIC PEPTIDE: B Natriuretic Peptide: 1692.4 pg/mL — ABNORMAL HIGH (ref 0.0–100.0)

## 2020-11-12 IMAGING — CR DG CHEST 2V
2 series · 2 of 2 positions shown · non-contrast
Comparison: [DATE]

CLINICAL DATA: Shortness of breath

EXAM:
CHEST - 2 VIEW

[w chest lat]
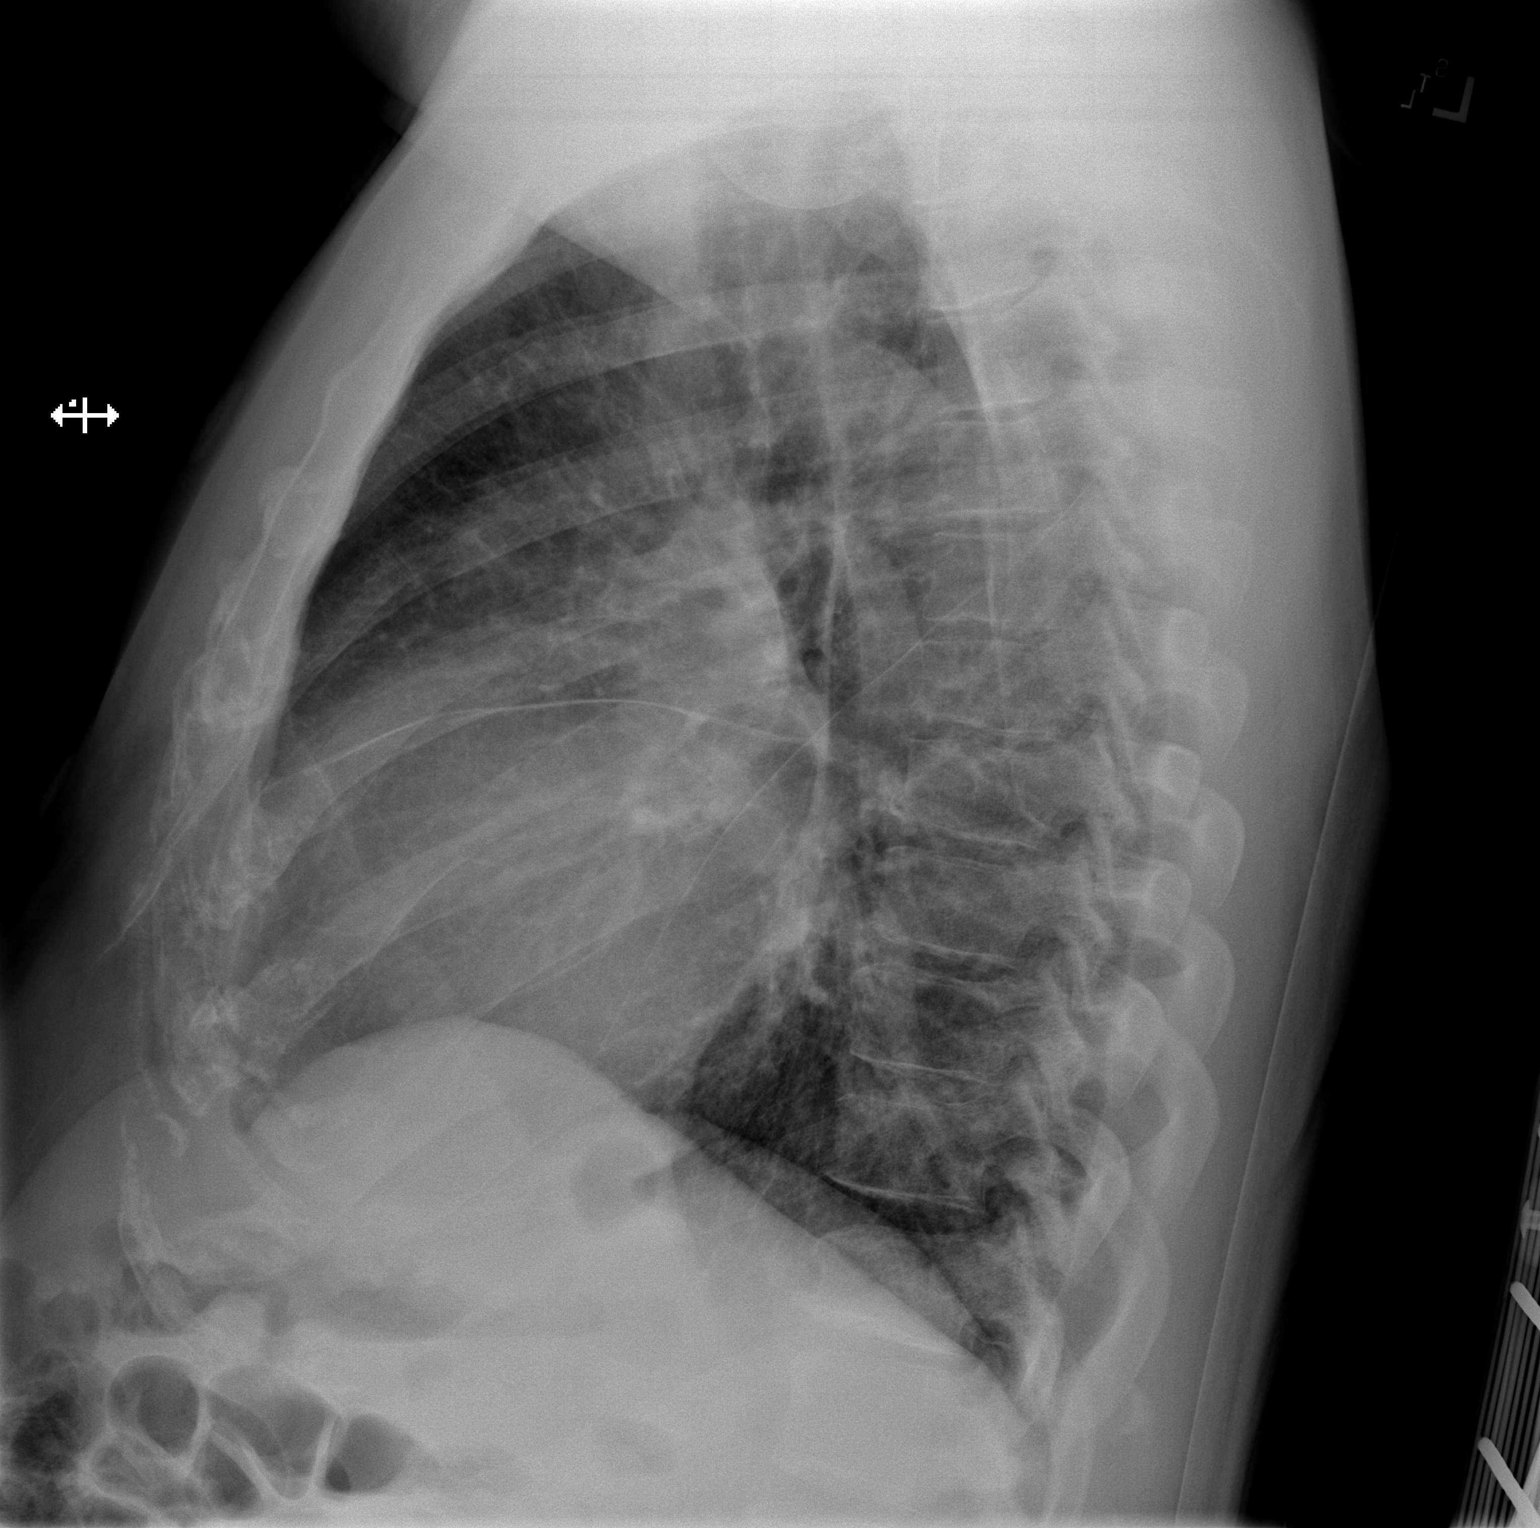

[x chest ap]
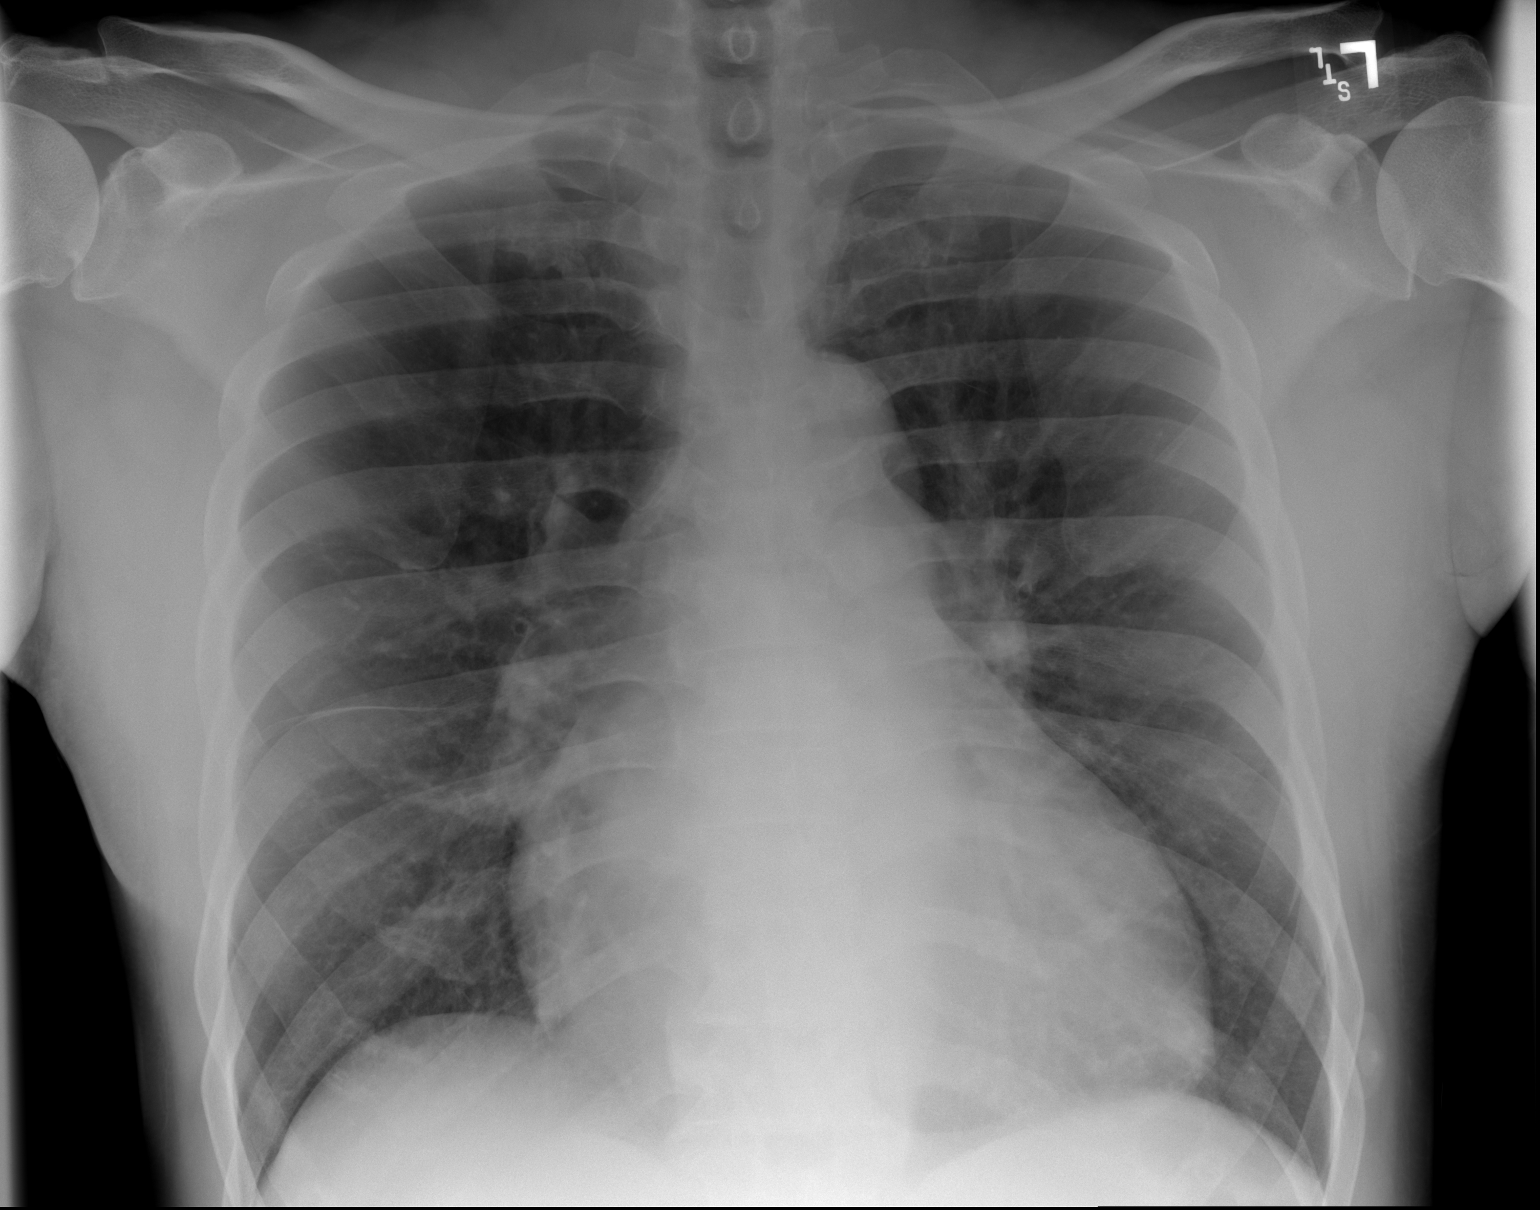

[2 of 2 positions shown; findings below may reference images not displayed]

FINDINGS: Cardiomegaly. No confluent airspace opacities, effusions or edema.
No acute bony abnormality.
IMPRESSION: Cardiomegaly.  No active disease.

## 2020-11-12 MED ORDER — FUROSEMIDE 10 MG/ML IJ SOLN
40.0000 mg | Freq: Once | INTRAMUSCULAR | Status: DC
  Filled 2020-11-12: qty 4

## 2020-11-12 MED ORDER — FUROSEMIDE 40 MG PO TABS
80.0000 mg | ORAL_TABLET | Freq: Once | ORAL | Status: AC
  Administered 2020-11-13: 80 mg via ORAL
  Filled 2020-11-12: qty 2

## 2020-11-12 MED ORDER — TRAMADOL HCL 50 MG PO TABS
50.0000 mg | ORAL_TABLET | Freq: Once | ORAL | Status: AC
  Administered 2020-11-13: 50 mg via ORAL
  Filled 2020-11-12: qty 1

## 2020-11-12 MED ORDER — FUROSEMIDE 40 MG PO TABS: 40.0000 mg | ORAL_TABLET | Freq: Every day | ORAL | 0 refills | Status: DC

## 2020-11-12 NOTE — ED Notes (Addendum)
Delo MD made aware of patient's behavior. This RN and MD at bedside attempting to speak to patient about his care. Patient is interruptive, argumentative, and belligerent towards staff. Upon attmepting to ask the patient questions about his needs and his care, patient continues to be irate and verbally abusive towards the staff. Discussed with patient that his behavior would not be tolerated, and that we were attempting to help. Patient continues to tell this writer, " I'm not talking to you." Unable to continue conversation due to patient being argumentative and interruptive. Patient will receive oral medications per MD and will be discharged to follow up outpatient.

## 2020-11-12 NOTE — ED Notes (Signed)
Attempted to obtain vitals, pt refuses to keep on  SPO2 monitor and blood pressure cuff.

## 2020-11-12 NOTE — ED Provider Notes (Signed)
[facility]   CSN: [id number] Arrival date & time: 11/12/20  1921     History Chief Complaint  Patient presents with  . Shortness of Breath    [name] is a 54 y.o. male.  Patient is a 54 year old male with past medical history of CHF, COPD, bipolar disorder, diabetes, homelessness, and schizoaffective.  He presents today for evaluation of shortness of breath.  This has been worsening over the past several days.  He denies any fevers or chills.  He denies any productive cough.  Patient supposed to be taking Lasix, but tells me he does not have a prescription for this.  Patient with history of medical noncompliance and frequent ED visits.  The history is provided by the patient.  Shortness of Breath Severity:  Moderate Onset quality:  Gradual Duration:  7 days Timing:  Constant Progression:  Worsening Chronicity:  Recurrent Context: activity   Relieved by:  Nothing Worsened by:  Exertion Ineffective treatments:  None tried Associated symptoms: no fever        Past Medical History:  Diagnosis Date  . Asthma   . Bipolar 1 disorder (HCC)   . CHF (congestive heart failure) (HCC)   . COPD (chronic obstructive pulmonary disease) (HCC)   . Depression   . Diabetes mellitus without complication (HCC)   . GERD (gastroesophageal reflux disease)   . Homelessness   . Hypertension   . QT prolongation   . Schizoaffective disorder [facility])     Patient Active Problem List   Diagnosis Date Noted  . Hypokalemia   . Hyperlipidemia   . Acute respiratory failure with hypoxia (HCC) 10/26/2020  . COPD with acute exacerbation (HCC) 10/14/2020  . Acute on chronic combined systolic and diastolic CHF (congestive heart failure) (HCC) 10/02/2020  . Near syncope 07/21/2020  . Schizoaffective disorder (HCC) 06/24/2019  . SOB (shortness of breath)   . QT prolongation 05/25/2019  . Cocaine abuse (HCC)   . Elevated troponin   .  Homelessness   . Suicidal ideation   . Bilateral foot pain 04/05/2019  . Housing situation unstable 03/28/2019  . Bilateral lower extremity edema   . Asthma 12/26/2016  . Tobacco use disorder 12/26/2016  . Schizophrenia (HCC) 12/26/2016  . Cocaine abuse with cocaine-induced mood disorder (HCC) 09/06/2016  . Alcohol use disorder, mild, abuse 07/31/2016  . Substance induced mood disorder (HCC) 07/30/2016  . Schizoaffective disorder, bipolar type (HCC) 05/31/2016  . Cocaine use disorder, severe, dependence (HCC) 05/31/2016  . Essential hypertension 05/31/2016    History reviewed. No pertinent surgical history.     Family History  Problem Relation Age of Onset  . Drug abuse Mother   . Suicidality Mother     Social History   Tobacco Use  . Smoking status: Current Every Day Smoker    Years: 30.00    Types: Cigarettes  . Smokeless tobacco: Never Used  Vaping Use  . Vaping Use: Never used  Substance Use Topics  . Alcohol use: Yes    Comment: infrequently  . Drug use: Yes    Types: Cocaine, Marijuana    Home Medications Prior to Admission medications   Medication Sig Start Date End Date Taking? Authorizing Provider  albuterol (VENTOLIN HFA) 108 (90 Base) MCG/ACT inhaler Inhale 2 puffs into the lungs every 6 (six) hours as needed for wheezing or shortness of breath. 09/26/20   Leatha Gilding, MD  amoxicillin (AMOXIL) 500 MG capsule Take 1 capsule (500 mg total)  by mouth 3 (three) times daily. 11/10/20   Mancel Bale, MD  atorvastatin (LIPITOR) 20 MG tablet Take 1 tablet (20 mg total) by mouth daily. 11/07/20   Jacalyn Lefevre, MD  carvedilol (COREG) 3.125 MG tablet Take 1 tablet (3.125 mg total) by mouth 2 (two) times daily with a meal. 11/07/20   Jacalyn Lefevre, MD  cloNIDine (CATAPRES) 0.1 MG tablet Take 1 tablet (0.1 mg total) by mouth 3 (three) times daily. 11/07/20   Jacalyn Lefevre, MD  clopidogrel (PLAVIX) 75 MG tablet Take 1 tablet (75 mg total) by mouth daily.  11/07/20   Jacalyn Lefevre, MD  divalproex (DEPAKOTE ER) 500 MG 24 hr tablet Take 1 tablet (500 mg total) by mouth in the morning and at bedtime. 11/07/20   Jacalyn Lefevre, MD  doxycycline (VIBRA-TABS) 100 MG tablet Take 100 mg by mouth 2 (two) times daily.    [provider]  furosemide (LASIX) 40 MG tablet Take 1 tablet (40 mg total) by mouth daily. 11/07/20 12/07/20  Jacalyn Lefevre, MD  gabapentin (NEURONTIN) 100 MG capsule Take 2 capsules (200 mg total) by mouth 2 (two) times daily. 10/07/20   Marguerita Merles Latif, DO  hydrALAZINE (APRESOLINE) 50 MG tablet Take 1 tablet (50 mg total) by mouth every 8 (eight) hours. 11/07/20   Jacalyn Lefevre, MD  isosorbide mononitrate (IMDUR) 30 MG 24 hr tablet Take 1 tablet (30 mg total) by mouth daily. 10/07/20 11/06/20  Marguerita Merles Latif, DO  lisinopril (ZESTRIL) 20 MG tablet Take 20 mg by mouth in the morning and at bedtime.    [provider]  losartan (COZAAR) 50 MG tablet Take 1 tablet (50 mg total) by mouth daily. 10/19/20 11/18/20  Jerald Kief, MD  neomycin-bacitracin-polymyxin (NEOSPORIN) ointment Apply topically 2 (two) times daily. Patient taking differently: Apply 1 application topically 2 (two) times daily.  09/26/20   Leatha Gilding, MD  senna-docusate (SENOKOT-S) 8.6-50 MG tablet Take 1 tablet by mouth at bedtime as needed for mild constipation. 10/07/20   Marguerita Merles Latif, DO  spironolactone (ALDACTONE) 25 MG tablet Take 0.5 tablets (12.5 mg total) by mouth daily. 10/19/20 11/18/20  Jerald Kief, MD  torsemide (DEMADEX) 20 MG tablet Take 20 mg by mouth 2 (two) times daily.    [provider]  traZODone (DESYREL) 50 MG tablet Take 50 mg by mouth at bedtime.    [provider]  umeclidinium bromide (INCRUSE ELLIPTA) 62.5 MCG/INH AEPB Inhale 1 puff into the lungs daily.    [provider]    Allergies    Ibuprofen and Acetaminophen  Review of Systems   Review of Systems  Constitutional:  Negative for fever.  Respiratory: Positive for shortness of breath.   All other systems reviewed and are negative.   Physical Exam Updated Vital Signs BP (!) 157/97 (BP Location: Right Arm)   Pulse (!) 103   Temp 97.7 F (36.5 C) (Oral)   Resp 20   Ht 5\' 9"  (1.753 m)   Wt 86.2 kg   SpO2 95%   BMI 28.06 kg/m   Physical Exam Vitals and nursing note reviewed.  Constitutional:      General: He is not in acute distress.    Appearance: He is well-developed and well-nourished. He is not diaphoretic.  HENT:     Head: Normocephalic and atraumatic.     Mouth/Throat:     Mouth: Oropharynx is clear and moist.  Cardiovascular:     Rate and Rhythm: Normal rate and regular  rhythm.     Heart sounds: No murmur heard. No friction rub.  Pulmonary:     Effort: Pulmonary effort is normal. No respiratory distress.     Breath sounds: Normal breath sounds. No wheezing or rales.  Abdominal:     General: Bowel sounds are normal. There is no distension.     Palpations: Abdomen is soft.     Tenderness: There is no abdominal tenderness.  Musculoskeletal:        General: Normal range of motion.     Cervical back: Normal range of motion and neck supple.     Right lower leg: No tenderness. Edema present.     Left lower leg: No tenderness. Edema present.     Comments: There is trace edema of both lower extremities.  DP pulses are palpable bilaterally.  Denna Haggard' sign is absent bilaterally.  Skin:    General: Skin is warm and dry.  Neurological:     Mental Status: He is alert and oriented to person, place, and time.     Coordination: Coordination normal.     ED Results / Procedures / Treatments   Labs (all labs ordered are listed, but only abnormal results are displayed) Labs Reviewed  CBC WITH DIFFERENTIAL/PLATELET - Abnormal; Notable for the following components:      Result Value   RBC 4.08 (*)    RDW 15.6 (*)    All other components within normal limits  COMPREHENSIVE METABOLIC PANEL -  Abnormal; Notable for the following components:   Calcium 8.7 (*)    All other components within normal limits  BRAIN NATRIURETIC PEPTIDE - Abnormal; Notable for the following components:   B Natriuretic Peptide 1,692.4 (*)    All other components within normal limits    EKG EKG Interpretation  Date/Time:  Monday November 12 2020 19:39:12 EST Ventricular Rate:  105 PR Interval:    QRS Duration: 93 QT Interval:  374 QTC Calculation: 495 R Axis:   24 Text Interpretation: Sinus tachycardia Ventricular premature complex Biatrial enlargement LVH with secondary repolarization abnormality Borderline prolonged QT interval No significant change since 11/07/2020 Confirmed by Geoffery Lyons ([id number]) on 11/12/2020 11:12:58 PM   Radiology DG Chest 2 View  Result Date: 11/12/2020 CLINICAL DATA:  Shortness of breath EXAM: CHEST - 2 VIEW COMPARISON:  11/07/2020 FINDINGS: Cardiomegaly. No confluent airspace opacities, effusions or edema. No acute bony abnormality. IMPRESSION: Cardiomegaly.  No active disease. Electronically Signed   By: Charlett Nose M.D.   On: 11/12/2020 20:06    Procedures Procedures (including critical care time)  Medications Ordered in ED Medications - No data to display  ED Course  I have reviewed the triage vital signs and the nursing notes.  Pertinent labs & imaging results that were available during my care of the patient were reviewed by me and considered in my medical decision making (see chart for details).    MDM Rules/Calculators/A&P  Patient is a 54 year old male with past medical history as per the HPI.  He presents with complaints of shortness of breath and swelling.  Patient has been noncompliant with his diuretics.  Patient arrives here with stable vital signs, no hypoxia, no respiratory distress.  Work-up initiated including laboratory studies and chest x-ray.  His BNP is elevated at 1700, but chest x-ray shows no overt signs of heart failure.  His lungs  sound basically clear.  My plan was to administer IV Lasix and begin diuresis.  IV was established, however patient removed his IV shortly afterward.  There was some sort of disagreement with the nurse and I was asked to reevaluate him.  Patient seemed very agitated when I returned to the room.  He began arguing with the RN at bedside.  Patient was able to have this heated conversation with no difficulty breathing.  At this point, I feel as though patient is stable for discharge.  He will be given oral Lasix, then discharged with a prescription for Lasix.  Patient to return as needed.  Final Clinical Impression(s) / ED Diagnoses Final diagnoses:  None    Rx / DC Orders ED Discharge Orders    None       Geoffery Lyons, MD 11/13/20 [phone]

## 2020-11-12 NOTE — ED Notes (Signed)
Went in to give patient lasix. Asked patient to move from the side of the bed so his IV could be visualized for med administration. Pt became belligerent, stating he was being rushed. Told patient I would step out and come back when he was ready. Pt then called back out and upon entering room, he had taken out his IV.

## 2020-11-12 NOTE — Discharge Instructions (Signed)
Begin taking Lasix as prescribed.  Follow-up with your primary doctor in the next few days, and return to the ER if symptoms worsen or change.

## 2020-11-12 NOTE — ED Triage Notes (Addendum)
Pt arrives EMS for shob increasing over the past week. Swelling in the lower extremities. Not complaint with furosemide. Pt has hx of copd and chf.

## 2020-11-13 ENCOUNTER — Other Ambulatory Visit: Payer: Self-pay

## 2020-11-13 ENCOUNTER — Observation Stay (HOSPITAL_COMMUNITY): Payer: Medicaid Other

## 2020-11-13 ENCOUNTER — Emergency Department (HOSPITAL_COMMUNITY): Payer: Medicaid Other

## 2020-11-13 ENCOUNTER — Ambulatory Visit: Payer: Medicaid - Out of State | Admitting: Internal Medicine

## 2020-11-13 ENCOUNTER — Inpatient Hospital Stay (HOSPITAL_COMMUNITY)
Admission: EM | Admit: 2020-11-13 | Discharge: 2020-11-16 | DRG: 291 | Disposition: A | Discharge status: Home / Self Care | Payer: Medicaid Other | Attending: Internal Medicine | Admitting: Internal Medicine

## 2020-11-13 ENCOUNTER — Encounter (HOSPITAL_COMMUNITY): Payer: Self-pay

## 2020-11-13 DIAGNOSIS — Z5900 Homelessness unspecified: Secondary | ICD-10-CM

## 2020-11-13 DIAGNOSIS — Z7902 Long term (current) use of antithrombotics/antiplatelets: Secondary | ICD-10-CM

## 2020-11-13 DIAGNOSIS — Z20822 Contact with and (suspected) exposure to covid-19: Secondary | ICD-10-CM | POA: Diagnosis present

## 2020-11-13 DIAGNOSIS — I5023 Acute on chronic systolic (congestive) heart failure: Secondary | ICD-10-CM | POA: Diagnosis not present

## 2020-11-13 DIAGNOSIS — Z79899 Other long term (current) drug therapy: Secondary | ICD-10-CM

## 2020-11-13 DIAGNOSIS — F1994 Other psychoactive substance use, unspecified with psychoactive substance-induced mood disorder: Secondary | ICD-10-CM | POA: Diagnosis present

## 2020-11-13 DIAGNOSIS — I509 Heart failure, unspecified: Secondary | ICD-10-CM

## 2020-11-13 DIAGNOSIS — F17210 Nicotine dependence, cigarettes, uncomplicated: Secondary | ICD-10-CM | POA: Diagnosis present

## 2020-11-13 DIAGNOSIS — Z9114 Patient's other noncompliance with medication regimen: Secondary | ICD-10-CM

## 2020-11-13 DIAGNOSIS — J449 Chronic obstructive pulmonary disease, unspecified: Secondary | ICD-10-CM | POA: Diagnosis present

## 2020-11-13 DIAGNOSIS — E785 Hyperlipidemia, unspecified: Secondary | ICD-10-CM | POA: Diagnosis present

## 2020-11-13 DIAGNOSIS — K219 Gastro-esophageal reflux disease without esophagitis: Secondary | ICD-10-CM | POA: Diagnosis present

## 2020-11-13 DIAGNOSIS — I110 Hypertensive heart disease with heart failure: Principal | ICD-10-CM | POA: Diagnosis present

## 2020-11-13 DIAGNOSIS — Z886 Allergy status to analgesic agent status: Secondary | ICD-10-CM

## 2020-11-13 DIAGNOSIS — J45909 Unspecified asthma, uncomplicated: Secondary | ICD-10-CM | POA: Diagnosis present

## 2020-11-13 DIAGNOSIS — L02419 Cutaneous abscess of limb, unspecified: Secondary | ICD-10-CM

## 2020-11-13 DIAGNOSIS — F1420 Cocaine dependence, uncomplicated: Secondary | ICD-10-CM | POA: Diagnosis present

## 2020-11-13 DIAGNOSIS — F250 Schizoaffective disorder, bipolar type: Secondary | ICD-10-CM | POA: Diagnosis present

## 2020-11-13 DIAGNOSIS — E11621 Type 2 diabetes mellitus with foot ulcer: Secondary | ICD-10-CM | POA: Diagnosis present

## 2020-11-13 DIAGNOSIS — L97519 Non-pressure chronic ulcer of other part of right foot with unspecified severity: Secondary | ICD-10-CM | POA: Diagnosis present

## 2020-11-13 DIAGNOSIS — Z9119 Patient's noncompliance with other medical treatment and regimen: Secondary | ICD-10-CM

## 2020-11-13 DIAGNOSIS — L03119 Cellulitis of unspecified part of limb: Secondary | ICD-10-CM

## 2020-11-13 DIAGNOSIS — L03115 Cellulitis of right lower limb: Secondary | ICD-10-CM | POA: Diagnosis present

## 2020-11-13 DIAGNOSIS — F1424 Cocaine dependence with cocaine-induced mood disorder: Secondary | ICD-10-CM | POA: Diagnosis present

## 2020-11-13 DIAGNOSIS — I10 Essential (primary) hypertension: Secondary | ICD-10-CM | POA: Diagnosis present

## 2020-11-13 LAB — COMPREHENSIVE METABOLIC PANEL
ALT: 30 U/L (ref 0–44)
AST: 27 U/L (ref 15–41)
Albumin: 3.4 g/dL — ABNORMAL LOW (ref 3.5–5.0)
Alkaline Phosphatase: 80 U/L (ref 38–126)
Anion gap: 9 (ref 5–15)
BUN: 17 mg/dL (ref 6–20)
CO2: 27 mmol/L (ref 22–32)
Calcium: 8.7 mg/dL — ABNORMAL LOW (ref 8.9–10.3)
Chloride: 103 mmol/L (ref 98–111)
Creatinine, Ser: 1.11 mg/dL (ref 0.61–1.24)
GFR, Estimated: >60 mL/min (ref >60)
Glucose, Bld: 111 mg/dL — ABNORMAL HIGH (ref 70–99)
Potassium: 4.3 mmol/L (ref 3.5–5.1)
Sodium: 139 mmol/L (ref 135–145)
Total Bilirubin: 1 mg/dL (ref 0.3–1.2)
Total Protein: 6.4 g/dL — ABNORMAL LOW (ref 6.5–8.1)

## 2020-11-13 LAB — RESP PANEL BY RT-PCR (FLU A&B, COVID) ARPGX2
Influenza A by PCR: NEGATIVE
Influenza B by PCR: NEGATIVE
SARS Coronavirus 2 by RT PCR: NEGATIVE

## 2020-11-13 LAB — CBC WITH DIFFERENTIAL/PLATELET
Abs Immature Granulocytes: 0.03 10*3/uL (ref 0.00–0.07)
Basophils Absolute: 0 10*3/uL (ref 0.0–0.1)
Basophils Relative: 0 %
Eosinophils Absolute: 0.1 10*3/uL (ref 0.0–0.5)
Eosinophils Relative: 1 %
HCT: 41.7 % (ref 39.0–52.0)
Hemoglobin: 13.8 g/dL (ref 13.0–17.0)
Immature Granulocytes: 0 %
Lymphocytes Relative: 21 %
Lymphs Abs: 2 10*3/uL (ref 0.7–4.0)
MCH: 32.9 pg (ref 26.0–34.0)
MCHC: 33.1 g/dL (ref 30.0–36.0)
MCV: 99.5 fL (ref 80.0–100.0)
Monocytes Absolute: 0.6 10*3/uL (ref 0.1–1.0)
Monocytes Relative: 6 %
Neutro Abs: 7 10*3/uL (ref 1.7–7.7)
Neutrophils Relative %: 72 %
Platelets: 270 10*3/uL (ref 150–400)
RBC: 4.19 MIL/uL — ABNORMAL LOW (ref 4.22–5.81)
RDW: 15.8 % — ABNORMAL HIGH (ref 11.5–15.5)
WBC: 9.7 10*3/uL (ref 4.0–10.5)
nRBC: 0 % (ref 0.0–0.2)

## 2020-11-13 LAB — C-REACTIVE PROTEIN: CRP: 10.8 mg/dL — ABNORMAL HIGH (ref <1.0)

## 2020-11-13 LAB — MAGNESIUM: Magnesium: 1.9 mg/dL (ref 1.7–2.4)

## 2020-11-13 LAB — SEDIMENTATION RATE: Sed Rate: 33 mm/hr — ABNORMAL HIGH (ref 0–16)

## 2020-11-13 IMAGING — CR DG CHEST 2V
2 series · 2 of 2 positions shown · non-contrast
Comparison: Film from the previous day.

CLINICAL DATA: Shortness of breath

EXAM:
CHEST - 2 VIEW

[w chest lat]
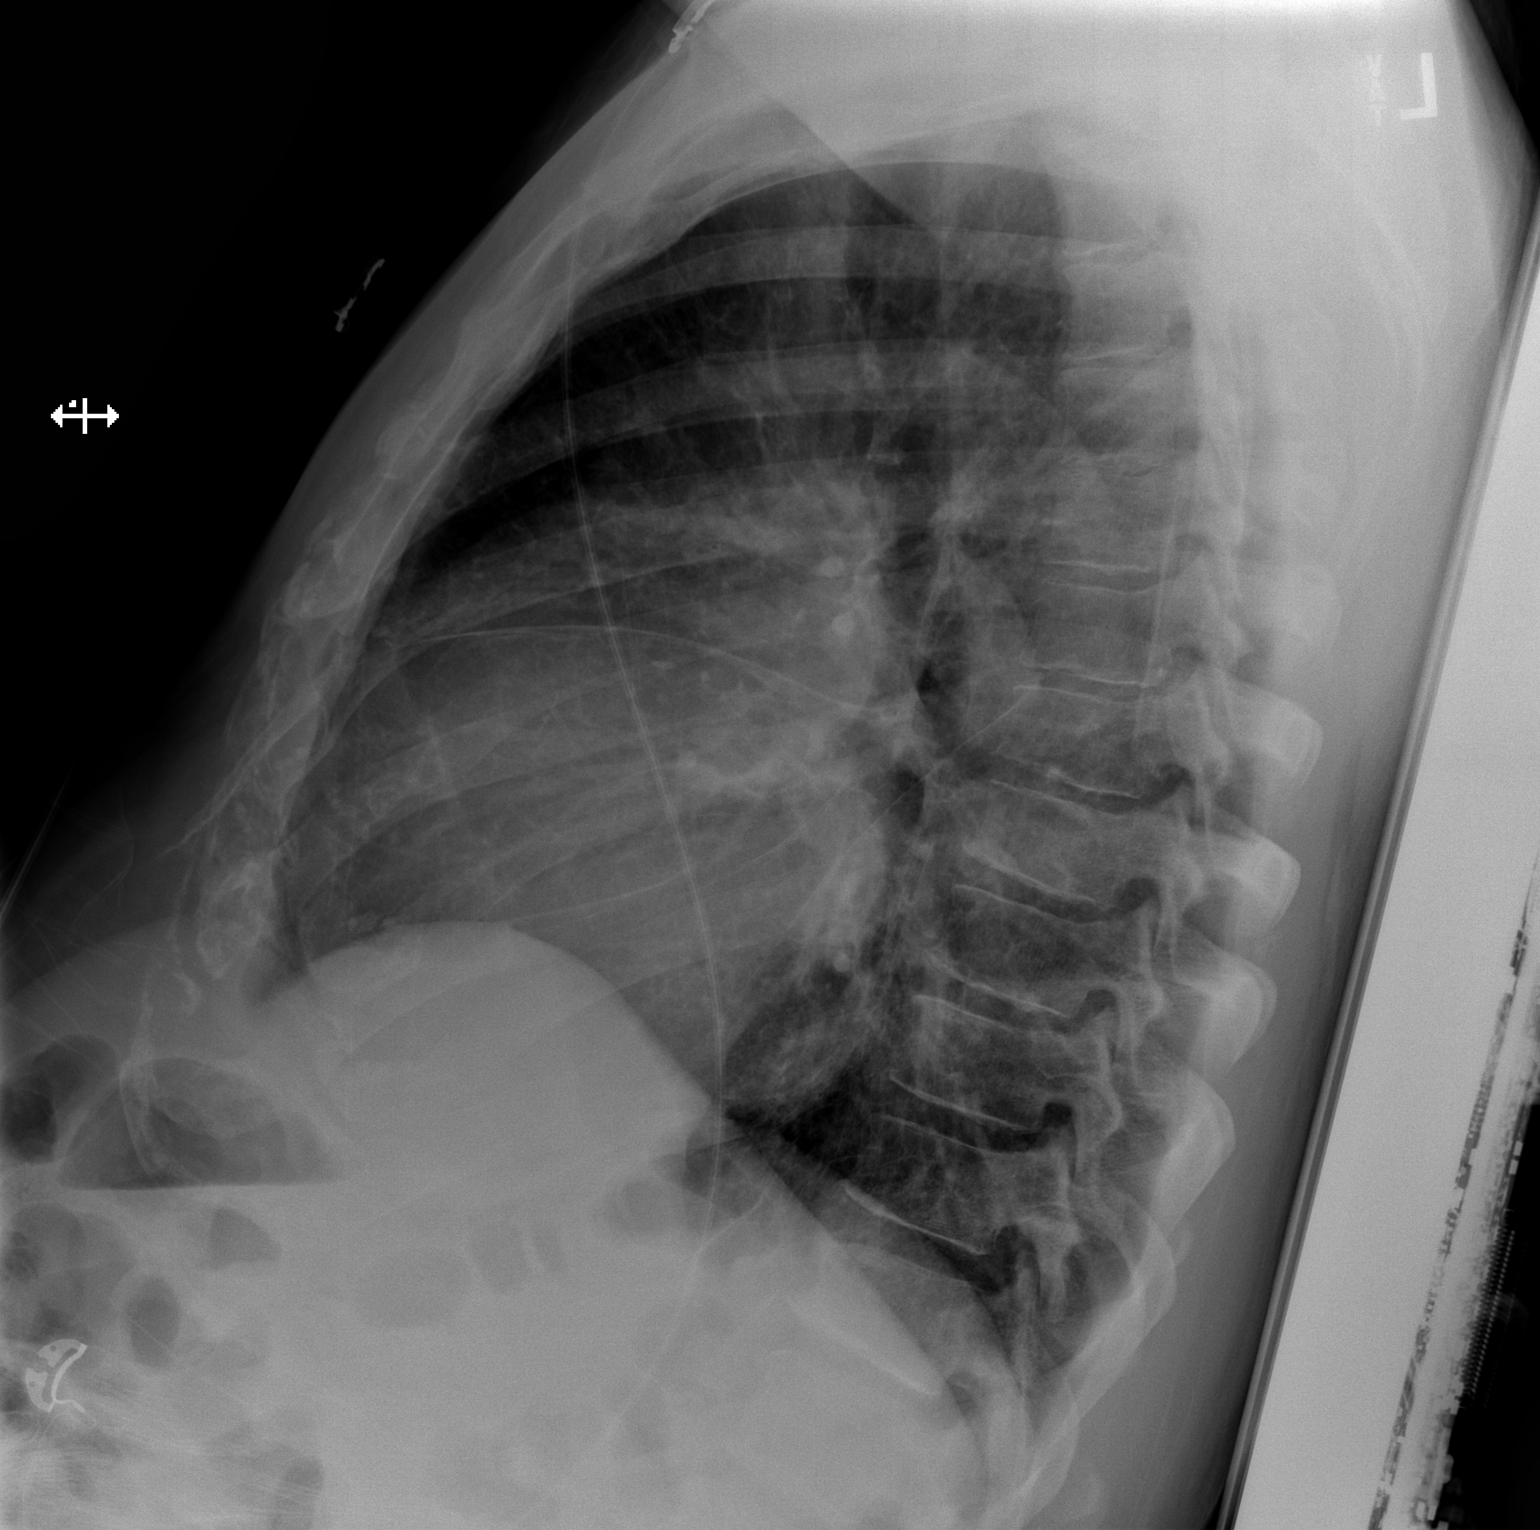

[x chest ap]
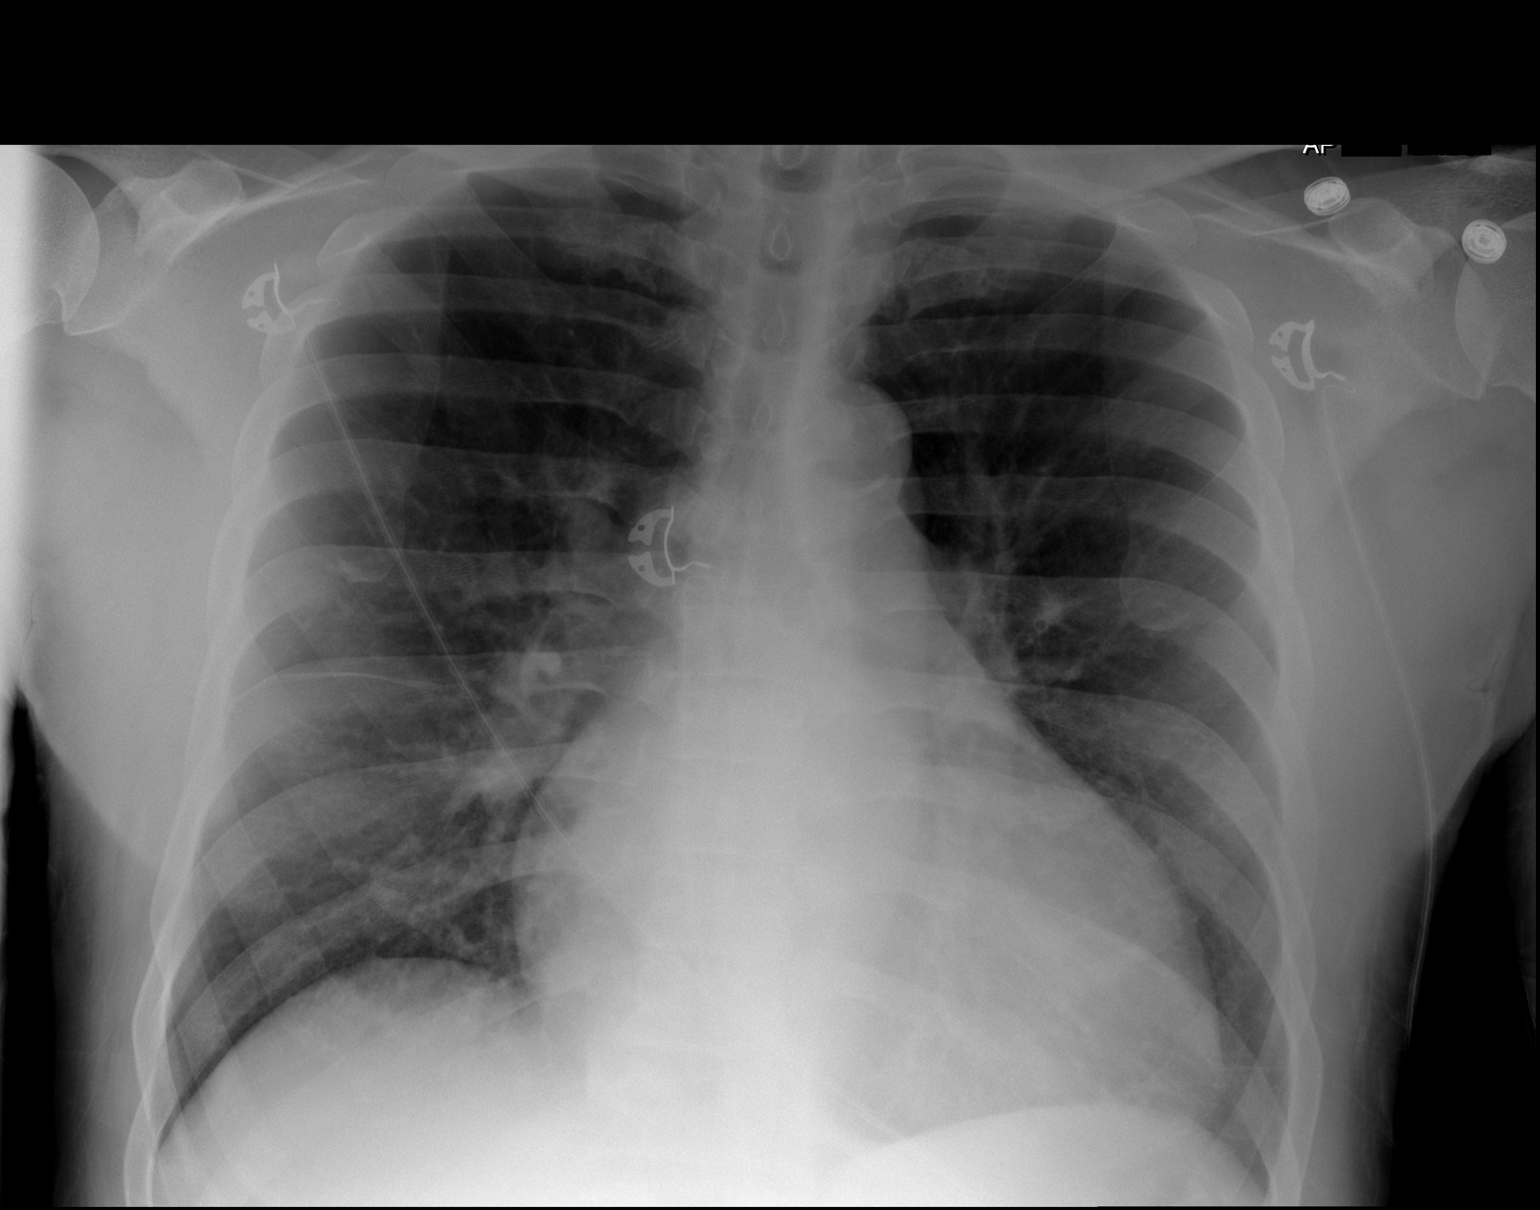

[2 of 2 positions shown; findings below may reference images not displayed]

FINDINGS: Cardiac shadow remains enlarged. Aortic calcifications are again
seen. Lungs are well aerated bilaterally. No focal infiltrate or
sizable effusion is noted.
IMPRESSION: No acute abnormality noted.

Stable cardiomegaly.

## 2020-11-13 IMAGING — CR DG FOOT COMPLETE 3+V*R*
3 series · 3 of 3 positions shown · non-contrast
Comparison: None.

CLINICAL DATA: Foot pain, no known injury, initial encounter

EXAM:
RIGHT FOOT COMPLETE - 3+ VIEW

[x foot ap right]
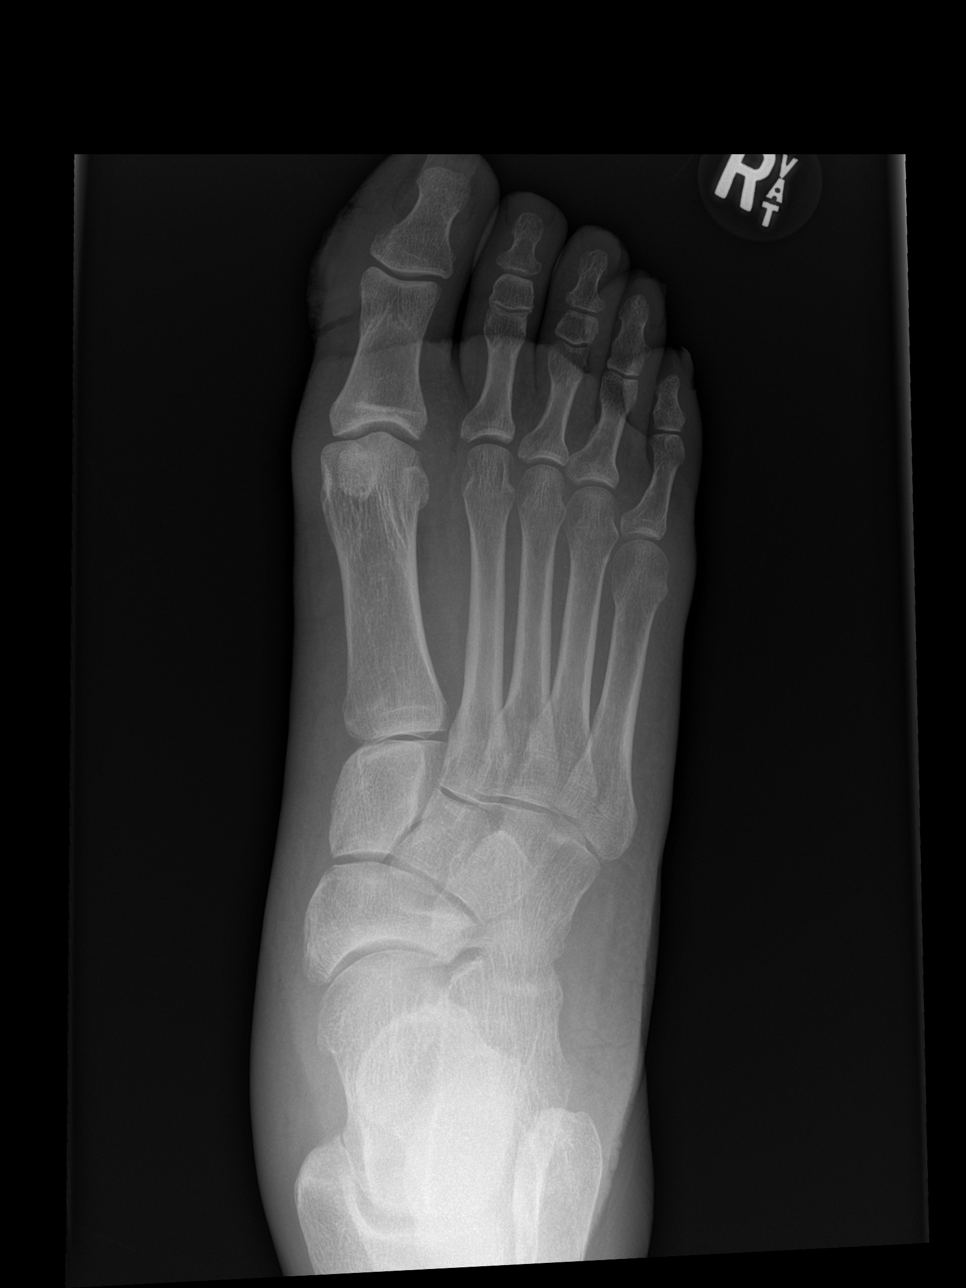

[x foot obl right]
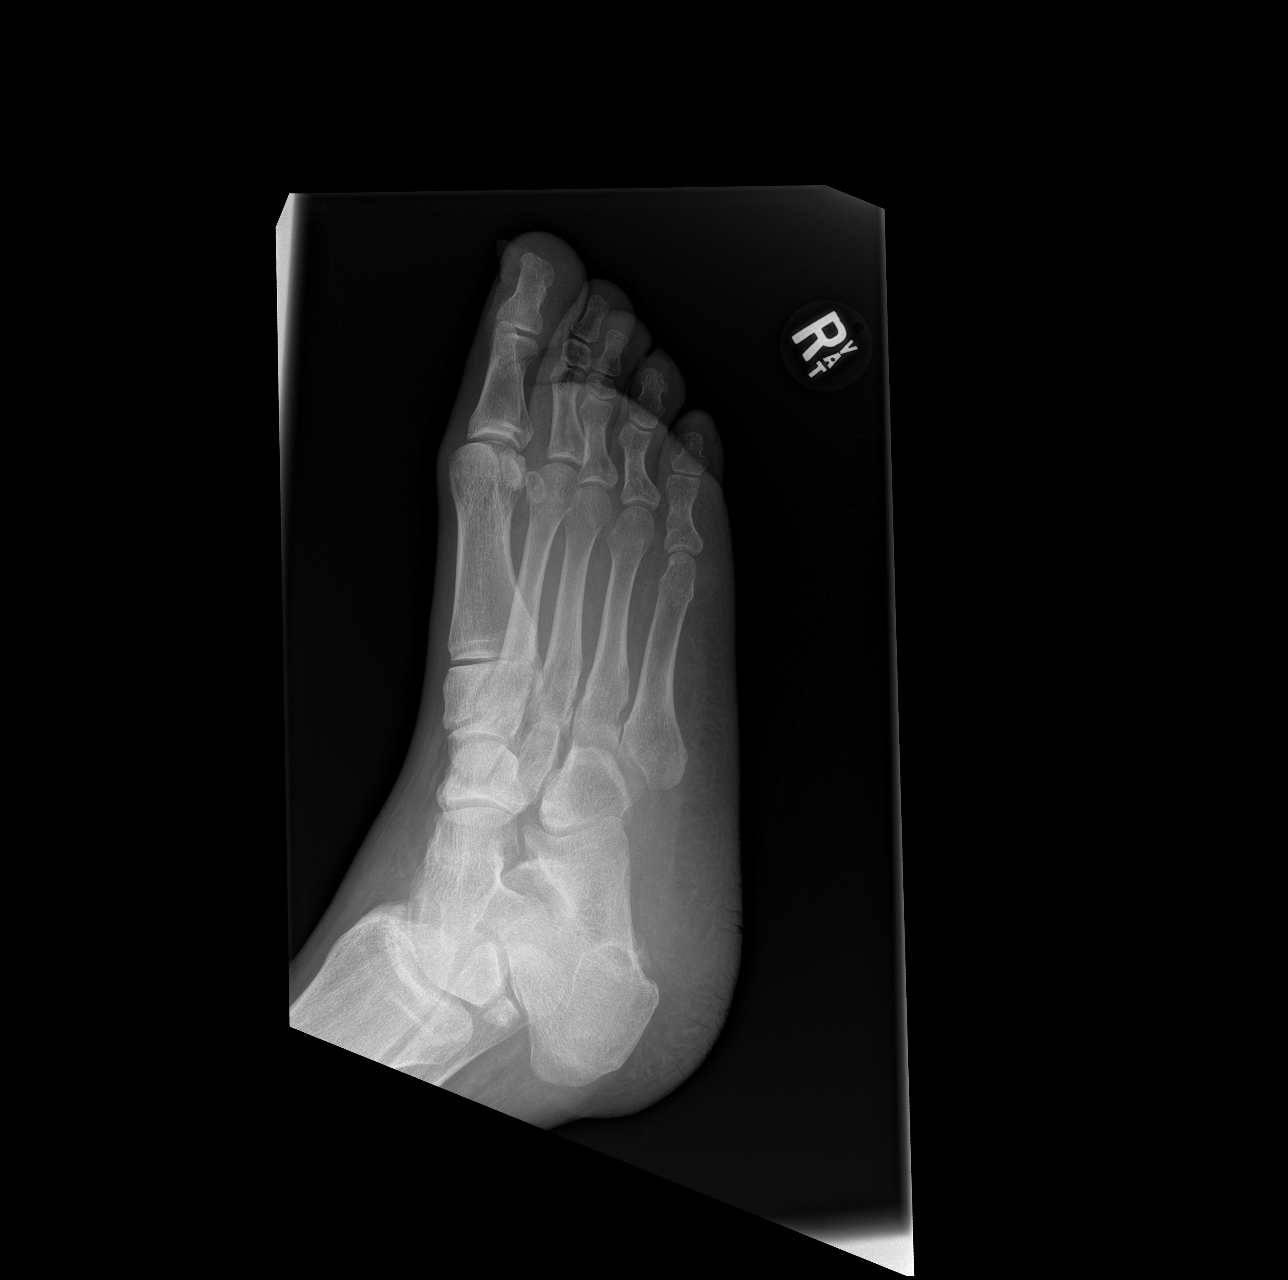

[x foot lat right]
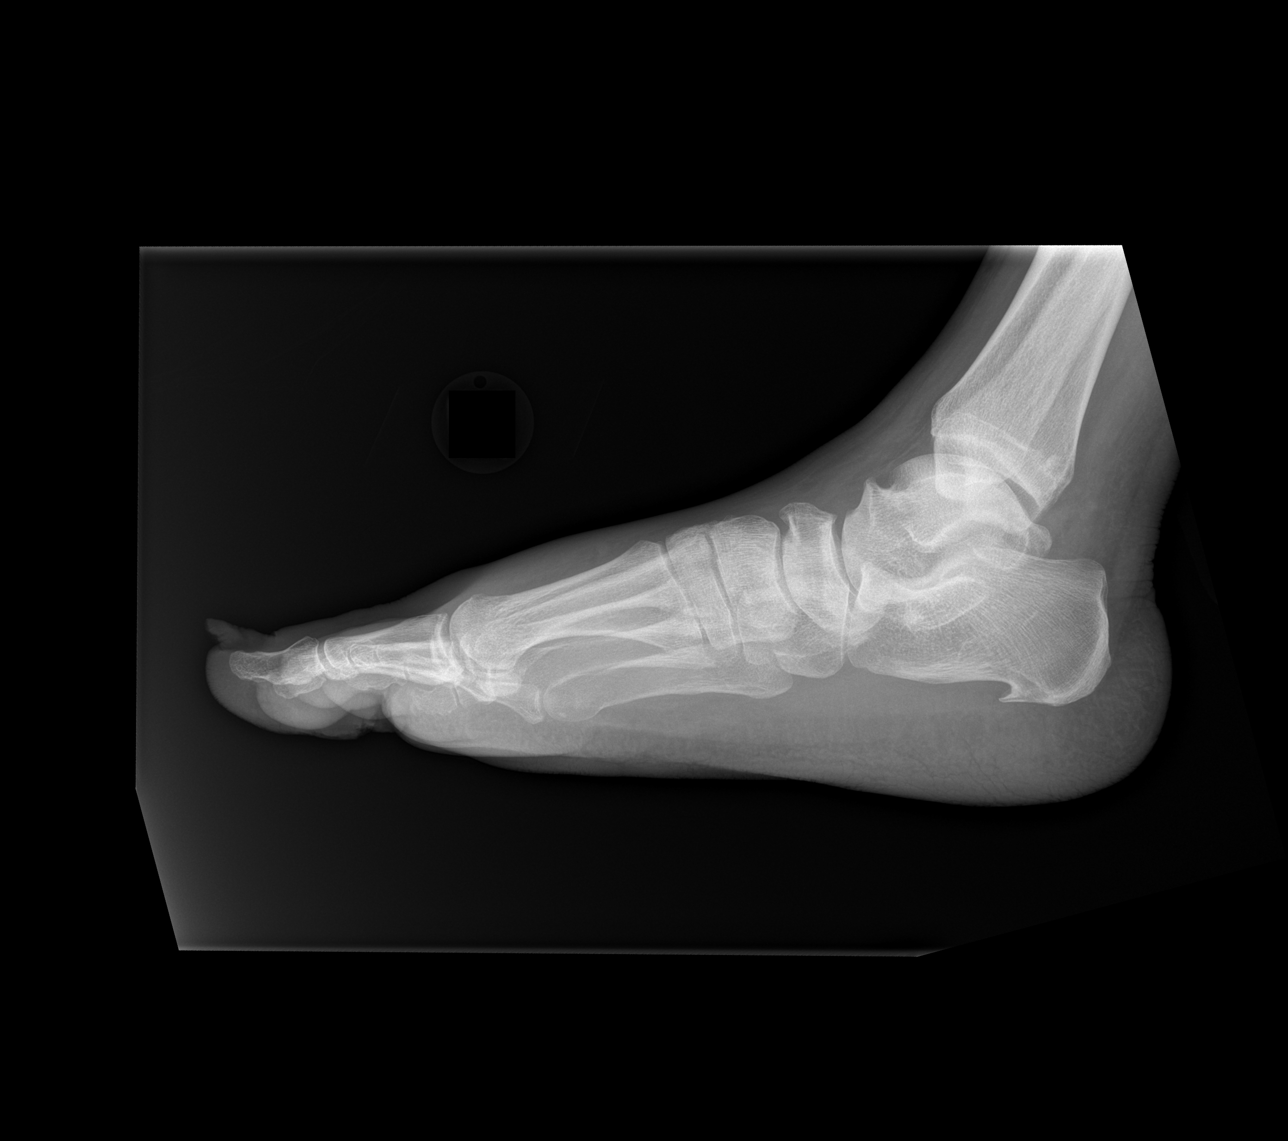

[3 of 3 positions shown; findings below may reference images not displayed]

FINDINGS: Calcaneal spurring is seen. Mild tarsal degenerative changes are
noted. No acute fracture or dislocation is identified. Mild soft
tissue swelling is noted over the metacarpals.
IMPRESSION: Mild soft tissue swelling and degenerative change. No acute bony
abnormality is noted.

## 2020-11-13 IMAGING — MR MR TOES*R* W/O CM
5 series · 40 of 40 positions shown · non-contrast
Comparison: Plain films right foot today.

CLINICAL DATA: Chronic ulceration on the right great toe. Question
osteomyelitis.

EXAM:
MRI OF THE RIGHT TOES WITHOUT CONTRAST
TECHNIQUE: Multiplanar, multisequence MR imaging of the toes of the right foot
was performed. No intravenous contrast was administered.

[Series 4: T2 fat-sat · axial · right · 3.0mm · 0.55mm/px · z∈[-48,+13]mm · 6 of 19 slices shown (1 of 2)]
[im 1/19]
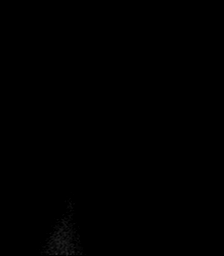
[im 4/19]
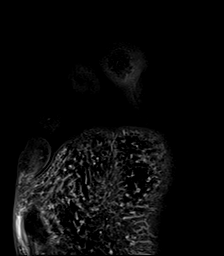
[im 8/19]
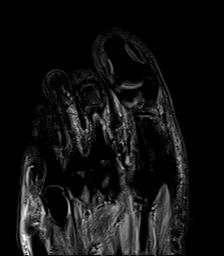
[im 11/19]
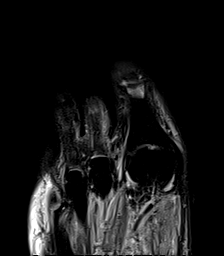
[im 15/19]
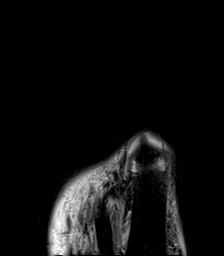
[im 19/19]
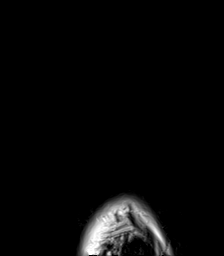

[Series 5: T1 · axial · right · 3.0mm · 0.55mm/px · z∈[-48,+13]mm · 6 of 19 slices shown (1 of 2)]
[im 1/19]
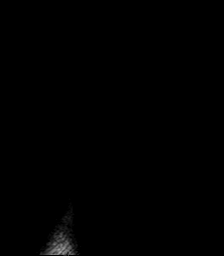
[im 4/19]
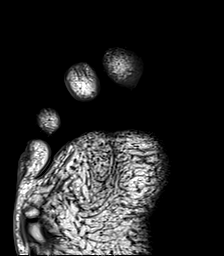
[im 8/19]
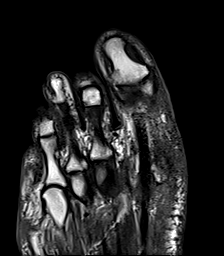
[im 11/19]
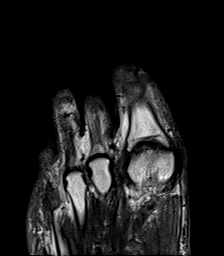
[im 15/19]
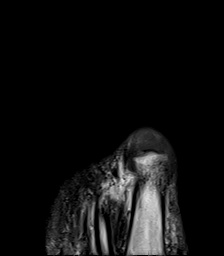
[im 19/19]
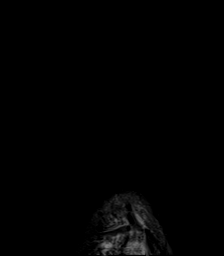

[Series 6: T2 fat-sat · coronal · right · 3.0mm · 0.38mm/px · 10 of 33 slices shown (2 of 2)]
[im 1/33]
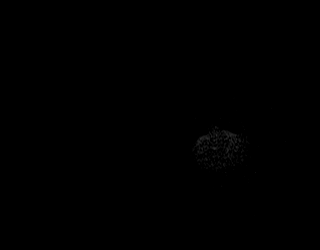
[im 4/33]
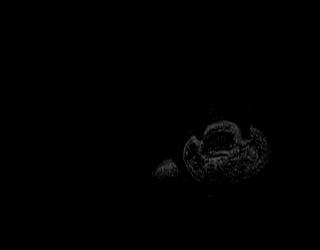
[im 8/33]
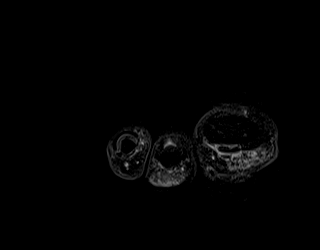
[im 11/33]
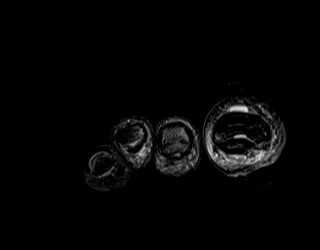
[im 15/33]
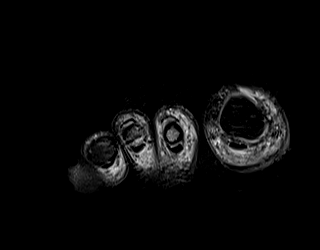
[im 18/33]
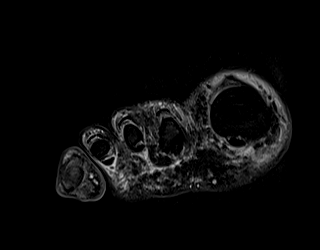
[im 22/33]
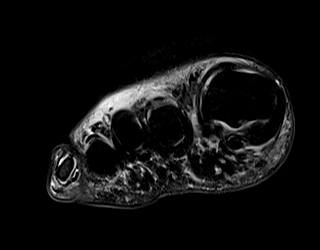
[im 25/33]
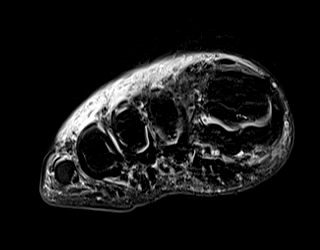
[im 29/33]
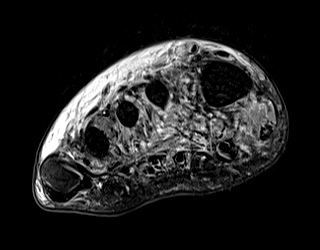
[im 33/33]
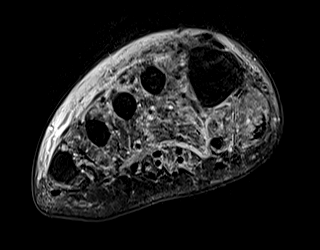

[Series 7: T1 · coronal · right · 3.0mm · 0.47mm/px · 10 of 33 slices shown (2 of 2)]
[im 1/33]
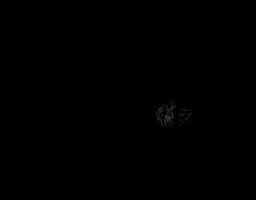
[im 4/33]
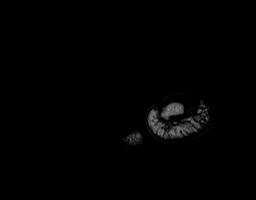
[im 8/33]
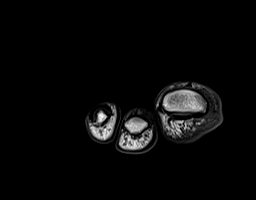
[im 11/33]
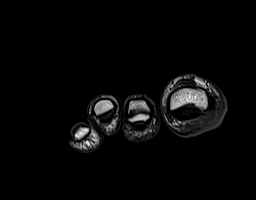
[im 15/33]
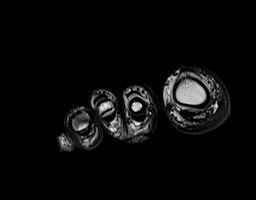
[im 18/33]
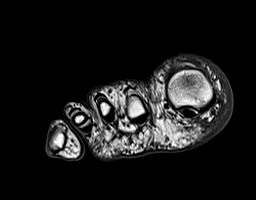
[im 22/33]
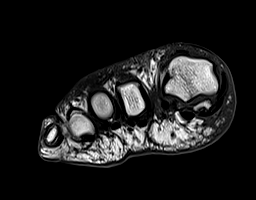
[im 25/33]
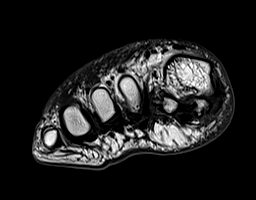
[im 29/33]
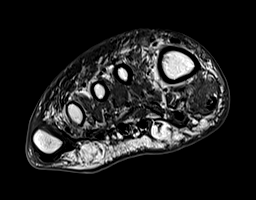
[im 33/33]
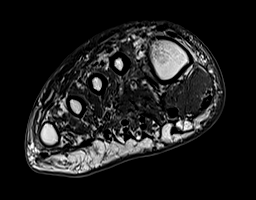

[Series 8: STIR · sagittal · right · 3.0mm · 0.27mm/px · 8 of 28 slices shown]
[im 1/28]
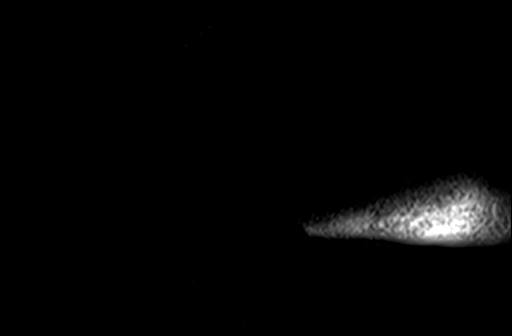
[im 4/28]
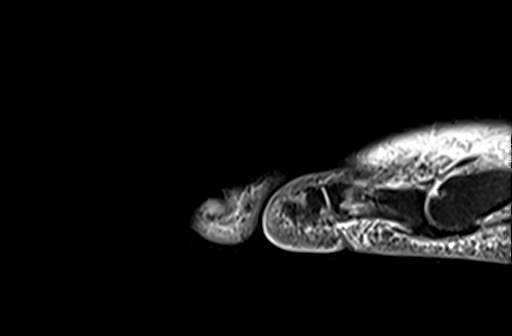
[im 8/28]
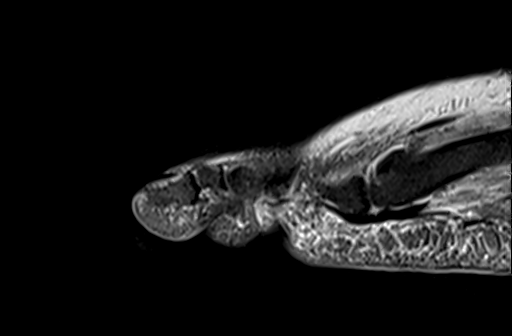
[im 12/28]
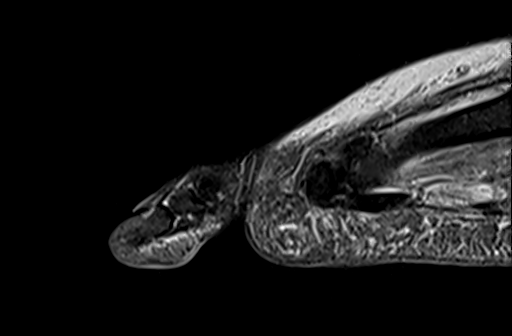
[im 16/28]
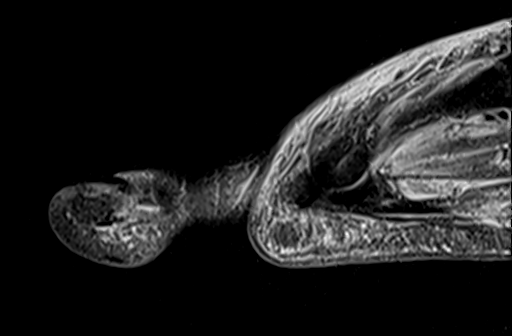
[im 20/28]
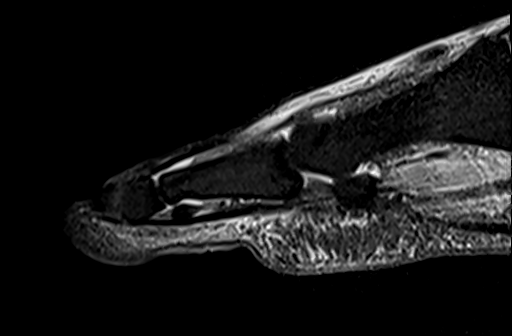
[im 24/28]
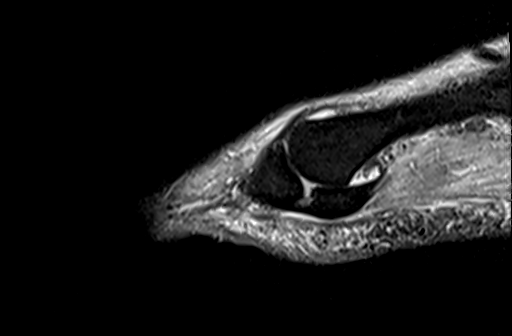
[im 28/28]
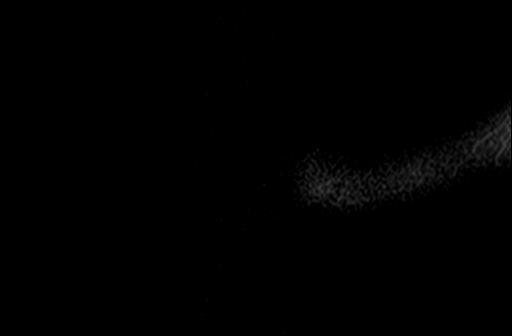

[40 of 40 positions shown; findings below may reference images not displayed]

FINDINGS: Bones/Joint/Cartilage

There is no marrow edema to suggest osteomyelitis. No joint
effusion. Mild to moderate first MTP osteoarthritis noted.

Ligaments

Intact.

Muscles and Tendons

Intact.  No intramuscular fluid collection.

Soft tissues

No abscess. Skin ulceration along the medial margin of the mid to
distal great toe is noted. There is underlying subcutaneous edema
but no abscess.
IMPRESSION: Skin ulceration of the great toe with underlying cellulitis.
Negative for osteomyelitis, abscess or septic joint.

## 2020-11-13 MED ORDER — IPRATROPIUM-ALBUTEROL 0.5-2.5 (3) MG/3ML IN SOLN
3.0000 mL | Freq: Four times a day (QID) | RESPIRATORY_TRACT | Status: DC
  Administered 2020-11-13 – 2020-11-14 (×4): 3 mL via RESPIRATORY_TRACT
  Filled 2020-11-13 (×5): qty 3

## 2020-11-13 MED ORDER — SENNOSIDES-DOCUSATE SODIUM 8.6-50 MG PO TABS: 1.0000 | ORAL_TABLET | Freq: Every evening | ORAL | Status: DC | PRN

## 2020-11-13 MED ORDER — FUROSEMIDE 10 MG/ML IJ SOLN
40.0000 mg | Freq: Two times a day (BID) | INTRAMUSCULAR | Status: DC
  Administered 2020-11-14 – 2020-11-16 (×5): 40 mg via INTRAVENOUS
  Filled 2020-11-13 (×5): qty 4

## 2020-11-13 MED ORDER — LORAZEPAM 1 MG PO TABS
1.0000 mg | ORAL_TABLET | Freq: Once | ORAL | Status: AC
  Administered 2020-11-13: 1 mg via ORAL
  Filled 2020-11-13: qty 1

## 2020-11-13 MED ORDER — OXYCODONE HCL 5 MG PO TABS
5.0000 mg | ORAL_TABLET | ORAL | Status: DC | PRN
  Administered 2020-11-13 – 2020-11-16 (×11): 5 mg via ORAL
  Filled 2020-11-13 (×12): qty 1

## 2020-11-13 MED ORDER — MORPHINE SULFATE (PF) 4 MG/ML IV SOLN
6.0000 mg | Freq: Once | INTRAVENOUS | Status: AC
  Administered 2020-11-13: 6 mg via INTRAVENOUS
  Filled 2020-11-13: qty 2

## 2020-11-13 MED ORDER — ALBUTEROL SULFATE (2.5 MG/3ML) 0.083% IN NEBU
2.5000 mg | INHALATION_SOLUTION | RESPIRATORY_TRACT | Status: DC | PRN
  Administered 2020-11-14: 2.5 mg via RESPIRATORY_TRACT
  Filled 2020-11-13: qty 3

## 2020-11-13 MED ORDER — PIPERACILLIN-TAZOBACTAM 3.375 G IVPB
3.3750 g | Freq: Three times a day (TID) | INTRAVENOUS | Status: DC
  Administered 2020-11-13 – 2020-11-16 (×8): 3.375 g via INTRAVENOUS
  Filled 2020-11-13 (×8): qty 50

## 2020-11-13 MED ORDER — ISOSORB DINITRATE-HYDRALAZINE 20-37.5 MG PO TABS
1.0000 | ORAL_TABLET | Freq: Three times a day (TID) | ORAL | Status: DC
  Administered 2020-11-13 – 2020-11-15 (×6): 1 via ORAL
  Filled 2020-11-13 (×10): qty 1

## 2020-11-13 MED ORDER — ENOXAPARIN SODIUM 40 MG/0.4ML ~~LOC~~ SOLN
40.0000 mg | SUBCUTANEOUS | Status: DC
  Filled 2020-11-13 (×2): qty 0.4

## 2020-11-13 MED ORDER — CARVEDILOL 3.125 MG PO TABS
3.1250 mg | ORAL_TABLET | Freq: Two times a day (BID) | ORAL | Status: DC
  Administered 2020-11-14: 3.125 mg via ORAL
  Filled 2020-11-13: qty 1

## 2020-11-13 MED ORDER — POTASSIUM CHLORIDE CRYS ER 20 MEQ PO TBCR
40.0000 meq | EXTENDED_RELEASE_TABLET | Freq: Once | ORAL | Status: AC
  Administered 2020-11-13: 40 meq via ORAL
  Filled 2020-11-13: qty 2

## 2020-11-13 MED ORDER — ONDANSETRON HCL 4 MG PO TABS: 4.0000 mg | ORAL_TABLET | Freq: Four times a day (QID) | ORAL | Status: DC | PRN

## 2020-11-13 MED ORDER — ONDANSETRON HCL 4 MG/2ML IJ SOLN: 4.0000 mg | Freq: Four times a day (QID) | INTRAMUSCULAR | Status: DC | PRN

## 2020-11-13 MED ORDER — PREDNISONE 20 MG PO TABS
40.0000 mg | ORAL_TABLET | Freq: Every day | ORAL | Status: DC
  Administered 2020-11-14 – 2020-11-15 (×2): 40 mg via ORAL
  Filled 2020-11-13 (×2): qty 2

## 2020-11-13 MED ORDER — POTASSIUM CHLORIDE CRYS ER 20 MEQ PO TBCR
20.0000 meq | EXTENDED_RELEASE_TABLET | Freq: Every day | ORAL | Status: DC
  Administered 2020-11-13 – 2020-11-15 (×3): 20 meq via ORAL
  Filled 2020-11-13 (×3): qty 1

## 2020-11-13 MED ORDER — FUROSEMIDE 10 MG/ML IJ SOLN
40.0000 mg | Freq: Once | INTRAMUSCULAR | Status: AC
  Administered 2020-11-13: 40 mg via INTRAVENOUS
  Filled 2020-11-13: qty 4

## 2020-11-13 MED ORDER — PIPERACILLIN-TAZOBACTAM 3.375 G IVPB 30 MIN
3.3750 g | Freq: Once | INTRAVENOUS | Status: AC
  Administered 2020-11-13: 3.375 g via INTRAVENOUS
  Filled 2020-11-13: qty 50

## 2020-11-13 NOTE — ED Notes (Signed)
Pt is not in the room so cannot update vitals

## 2020-11-13 NOTE — H&P (Signed)
History and Physical    [name] UMP:[id number] DOB: 15-Nov-1966 DOA: 11/13/2020  PCP: [name]  Patient coming from: Home  Chief Complaint: shortness of breath  HPI: [name] is a 54 y.o. male with medical history significant of bipolar, COPD, DM2, HFrEF. Presenting with dyspnea on exertion and leg swelling for the past week. Known history of HFrEF and medical non-compliance. He has not been taking his lasix. He reports that he is unable to pick up the prescriptions. Multiple admissions since August 2021 for the same presentation and same medical non-compliance. His symptoms have worsened in the last 2 days to the point he is unable to walk short distances or lie flat. He reports no other aggravating or alleviating factors. He came to the ED last night for assistance, but left AMA. He returns to the ED today for help.     ED Course: Given IV lasix. Phys exam showed chronic ulceration of right great toe. MRI ordered for eval for osteo. Started on zosyn. TRH called for admission.   Review of Systems: Review of systems is otherwise negative for all not mentioned in HPI.   PMHx Past Medical History:  Diagnosis Date  . Asthma   . Bipolar 1 disorder (HCC)   . CHF (congestive heart failure) (HCC)   . COPD (chronic obstructive pulmonary disease) (HCC)   . Depression   . Diabetes mellitus without complication (HCC)   . GERD (gastroesophageal reflux disease)   . Homelessness   . Hypertension   . QT prolongation   . Schizoaffective disorder (HCC)     PSHx History reviewed. No pertinent surgical history.  SocHx  reports that he has been smoking cigarettes. He has smoked for the past 30.00 years. He has never used smokeless tobacco. He reports current alcohol use. He reports current drug use. Drugs: Cocaine and Marijuana.  Allergies  Allergen Reactions  . Ibuprofen Anaphylaxis  . Acetaminophen Hives, Nausea And Vomiting and Rash    Pt overdosed on bottle of APAP  05/06/16     FamHx Family History  Problem Relation Age of Onset  . Drug abuse Mother   . Suicidality Mother     Prior to Admission medications   Medication Sig Start Date End Date Taking? Authorizing Provider  albuterol (VENTOLIN HFA) 108 (90 Base) MCG/ACT inhaler Inhale 2 puffs into the lungs every 6 (six) hours as needed for wheezing or shortness of breath. 09/26/20   Leatha Gilding, MD  amoxicillin (AMOXIL) 500 MG capsule Take 1 capsule (500 mg total) by mouth 3 (three) times daily. 11/10/20   Mancel Bale, MD  atorvastatin (LIPITOR) 20 MG tablet Take 1 tablet (20 mg total) by mouth daily. 11/07/20   Jacalyn Lefevre, MD  carvedilol (COREG) 3.125 MG tablet Take 1 tablet (3.125 mg total) by mouth 2 (two) times daily with a meal. 11/07/20   Jacalyn Lefevre, MD  cloNIDine (CATAPRES) 0.1 MG tablet Take 1 tablet (0.1 mg total) by mouth 3 (three) times daily. 11/07/20   Jacalyn Lefevre, MD  clopidogrel (PLAVIX) 75 MG tablet Take 1 tablet (75 mg total) by mouth daily. 11/07/20   Jacalyn Lefevre, MD  divalproex (DEPAKOTE ER) 500 MG 24 hr tablet Take 1 tablet (500 mg total) by mouth in the morning and at bedtime. 11/07/20   Jacalyn Lefevre, MD  doxycycline (VIBRA-TABS) 100 MG tablet Take 100 mg by mouth 2 (two) times daily.    [provider]  furosemide (LASIX) 40 MG tablet Take 1 tablet (40  mg total) by mouth daily. 11/12/20   Geoffery Lyons, MD  gabapentin (NEURONTIN) 100 MG capsule Take 2 capsules (200 mg total) by mouth 2 (two) times daily. 10/07/20   Marguerita Merles Latif, DO  hydrALAZINE (APRESOLINE) 50 MG tablet Take 1 tablet (50 mg total) by mouth every 8 (eight) hours. 11/07/20   Jacalyn Lefevre, MD  isosorbide mononitrate (IMDUR) 30 MG 24 hr tablet Take 1 tablet (30 mg total) by mouth daily. 10/07/20 11/06/20  Marguerita Merles Latif, DO  lisinopril (ZESTRIL) 20 MG tablet Take 20 mg by mouth in the morning and at bedtime.    [provider]  losartan (COZAAR) 50 MG tablet Take 1  tablet (50 mg total) by mouth daily. 10/19/20 11/18/20  Jerald Kief, MD  neomycin-bacitracin-polymyxin (NEOSPORIN) ointment Apply topically 2 (two) times daily. Patient taking differently: Apply 1 application topically 2 (two) times daily.  09/26/20   Leatha Gilding, MD  senna-docusate (SENOKOT-S) 8.6-50 MG tablet Take 1 tablet by mouth at bedtime as needed for mild constipation. 10/07/20   Marguerita Merles Latif, DO  spironolactone (ALDACTONE) 25 MG tablet Take 0.5 tablets (12.5 mg total) by mouth daily. 10/19/20 11/18/20  Jerald Kief, MD  torsemide (DEMADEX) 20 MG tablet Take 20 mg by mouth 2 (two) times daily.    [provider]  traZODone (DESYREL) 50 MG tablet Take 50 mg by mouth at bedtime.    [provider]  umeclidinium bromide (INCRUSE ELLIPTA) 62.5 MCG/INH AEPB Inhale 1 puff into the lungs daily.    [provider]    Physical Exam: Vitals:   11/13/20 0715 11/13/20 0716 11/13/20 0844 11/13/20 1126  BP: (!) 165/116  (!) 147/120 (!) 143/126  Pulse: (!) 105  (!) 102 (!) 102  Resp: 20  (!) 25 17  Temp: 97.8 F (36.6 C)   98 F (36.7 C)  TempSrc: Oral   Oral  SpO2: 100%  99% 100%  Weight:  86.2 kg    Height:  5\' 9"  (1.753 m)      General: 54 y.o. male resting in bed in NAD Eyes: PERRL, normal sclera ENMT: Nares patent w/o discharge, orophaynx clear, dentition normal, ears w/o discharge/lesions/ulcers Neck: Supple, trachea midline Cardiovascular: RRR, +S1, S2, no m/g/r, equal pulses throughout Respiratory: decreased at bases, somewhat tight w/ soft wheeze, slightly increased WOB on RA GI: BS+, NDNT, no masses noted, no organomegaly noted MSK: No c/c; BLE edema, BL feet calloses, chronic changes, w/ bleeding ulceration of base of right great toe Skin: No rashes, bruises, ulcerations noted Neuro: A&O x 3, no focal deficits Psyc: Somewhat agitated but cooperative  Labs on Admission: I have personally reviewed following labs and imaging  studies  CBC: Recent Labs  Lab 11/07/20 0345 11/12/20 1946  WBC 9.6 9.1  NEUTROABS  --  6.7  HGB 14.4 13.5  HCT 44.8 40.5  MCV 101.1* 99.3  PLT 250 234   Basic Metabolic Panel: Recent Labs  Lab 11/07/20 0345 11/12/20 1946  NA 138 141  K 4.4 3.9  CL 102 105  CO2 23 26  GLUCOSE 84 86  BUN 17 16  CREATININE 1.28* 1.17  CALCIUM 8.9 8.7*   GFR: Estimated Creatinine Clearance: 80.3 mL/min (by C-G formula based on SCr of 1.17 mg/dL). Liver Function Tests: Recent Labs  Lab 11/12/20 1946  AST 33  ALT 34  ALKPHOS 80  BILITOT 1.1  PROT 6.6  ALBUMIN 3.5   No results for input(s): LIPASE, AMYLASE in the last  168 hours. No results for input(s): AMMONIA in the last 168 hours. Coagulation Profile: No results for input(s): INR, PROTIME in the last 168 hours. Cardiac Enzymes: No results for input(s): CKTOTAL, CKMB, CKMBINDEX, TROPONINI in the last 168 hours. BNP (last 3 results) No results for input(s): PROBNP in the last 8760 hours. HbA1C: No results for input(s): HGBA1C in the last 72 hours. CBG: No results for input(s): GLUCAP in the last 168 hours. Lipid Profile: No results for input(s): CHOL, HDL, LDLCALC, TRIG, CHOLHDL, LDLDIRECT in the last 72 hours. Thyroid Function Tests: No results for input(s): TSH, T4TOTAL, FREET4, T3FREE, THYROIDAB in the last 72 hours. Anemia Panel: No results for input(s): VITAMINB12, FOLATE, FERRITIN, TIBC, IRON, RETICCTPCT in the last 72 hours. Urine analysis:    Component Value Date/Time   COLORURINE COLORLESS (A) 10/27/2020 0814   APPEARANCEUR CLEAR 10/27/2020 0814   LABSPEC 1.004 (L) 10/27/2020 0814   PHURINE 7.0 10/27/2020 0814   GLUCOSEU NEGATIVE 10/27/2020 0814   HGBUR NEGATIVE 10/27/2020 0814   BILIRUBINUR NEGATIVE 10/27/2020 0814   KETONESUR NEGATIVE 10/27/2020 0814   PROTEINUR NEGATIVE 10/27/2020 0814   NITRITE NEGATIVE 10/27/2020 0814   LEUKOCYTESUR NEGATIVE 10/27/2020 0814    Radiological Exams on Admission: DG  Chest 2 View  Result Date: 11/13/2020 CLINICAL DATA:  Shortness of breath EXAM: CHEST - 2 VIEW COMPARISON:  Film from the previous day. FINDINGS: Cardiac shadow remains enlarged. Aortic calcifications are again seen. Lungs are well aerated bilaterally. No focal infiltrate or sizable effusion is noted. IMPRESSION: No acute abnormality noted. Stable cardiomegaly. Electronically Signed   By: Alcide Clever M.D.   On: 11/13/2020 09:26   DG Chest 2 View  Result Date: 11/12/2020 CLINICAL DATA:  Shortness of breath EXAM: CHEST - 2 VIEW COMPARISON:  11/07/2020 FINDINGS: Cardiomegaly. No confluent airspace opacities, effusions or edema. No acute bony abnormality. IMPRESSION: Cardiomegaly.  No active disease. Electronically Signed   By: Charlett Nose M.D.   On: 11/12/2020 20:06   DG Foot Complete Right  Result Date: 11/13/2020 CLINICAL DATA:  Foot pain, no known injury, initial encounter EXAM: RIGHT FOOT COMPLETE - 3+ VIEW COMPARISON:  None. FINDINGS: Calcaneal spurring is seen. Mild tarsal degenerative changes are noted. No acute fracture or dislocation is identified. Mild soft tissue swelling is noted over the metacarpals. IMPRESSION: Mild soft tissue swelling and degenerative change. No acute bony abnormality is noted. Electronically Signed   By: Alcide Clever M.D.   On: 11/13/2020 09:25    EKG: Independently reviewed. Sinus tachy, no st elevations  Assessment/Plan Acute on chronic HFrEF     - admit to obs, telemetry     - lasix IV 40mg  BID     - fluid restriction to 1200cc, Na+ restriction     - hydralazine, isosorbide; ACEi/ARB if he can tolerate it     - I&O, daily weights  Foot infection, right foot     - MRI to r/o osteo     - started on zosyn in ED; continue  HTN     - let's get his meds rec'd.     - should be on ACEi or ARB if he can tolerate it; will add BB and Bidil; follow BPs  HLD     - statin  COPD     - nebs, steroids, follow  Medical non-compliance     - 13 admission or ED  visit since September with usually the same CC     - multiple attempts to get him assistance  with TOC     - not sure there is much we are going to be able to do on this front given his psyc history     - will ask TOC to comment again  DVT prophylaxis: lovenox  Code Status: FULL  Family Communication: None at bedside  Consults called: None   Status is: Observation  The patient remains OBS appropriate and will d/c before 2 midnights.  Dispo: The patient is from: Home              Anticipated d/c is to: Home              Anticipated d/c date is: 1 day              Patient currently is not medically stable to d/c.  Teddy Spike DO [facility]  If 7PM-7AM, please contact night-coverage [email or web address]  11/13/2020, 12:03 PM

## 2020-11-13 NOTE — ED Notes (Signed)
Patient will not stay still in bed and continues to remove BP cuff

## 2020-11-13 NOTE — Progress Notes (Signed)
Pt being interruptive, argumentative, and belligerent towards MRI staff.

## 2020-11-13 NOTE — ED Triage Notes (Signed)
Pt returns for lasix after removing IV last night and not getting medication. Pt took PO lasix and has been given rx.

## 2020-11-13 NOTE — Progress Notes (Signed)
Pharmacy Antibiotic Note  [name] is a 54 y.o. male admitted on 11/13/2020 with R foot infection.  Pharmacy has been consulted for zosyn dosing.  Plan: Zosyn 3.375g IV q8h (4 hour infusion).  Pharmacy to sign off  Height: 5\' 9"  (175.3 cm) Weight: 86.2 kg (190 lb) IBW/kg (Calculated) : 70.7  Temp (24hrs), Avg:97.8 F (36.6 C), Min:97.7 F (36.5 C), Max:98 F (36.7 C)  Recent Labs  Lab 11/07/20 0345 11/12/20 1946 11/13/20 0925  WBC 9.6 9.1 9.7  CREATININE 1.28* 1.17  --     Estimated Creatinine Clearance: 80.3 mL/min (by C-G formula based on SCr of 1.17 mg/dL).    Allergies  Allergen Reactions  . Ibuprofen Anaphylaxis  . Acetaminophen Hives, Nausea And Vomiting and Rash    Pt overdosed on bottle of APAP 05/06/16    Thank you for allowing pharmacy to be a part of this patient's care.  07/06/16, Pharm.D 11/13/2020 12:57 PM

## 2020-11-13 NOTE — ED Notes (Signed)
Patient states he will be back at shift change

## 2020-11-14 ENCOUNTER — Encounter (HOSPITAL_COMMUNITY): Payer: Self-pay | Admitting: Internal Medicine

## 2020-11-14 DIAGNOSIS — F1994 Other psychoactive substance use, unspecified with psychoactive substance-induced mood disorder: Secondary | ICD-10-CM

## 2020-11-14 DIAGNOSIS — I10 Essential (primary) hypertension: Secondary | ICD-10-CM

## 2020-11-14 DIAGNOSIS — K219 Gastro-esophageal reflux disease without esophagitis: Secondary | ICD-10-CM | POA: Diagnosis present

## 2020-11-14 DIAGNOSIS — R0602 Shortness of breath: Secondary | ICD-10-CM | POA: Diagnosis not present

## 2020-11-14 DIAGNOSIS — J45909 Unspecified asthma, uncomplicated: Secondary | ICD-10-CM

## 2020-11-14 DIAGNOSIS — F1420 Cocaine dependence, uncomplicated: Secondary | ICD-10-CM

## 2020-11-14 DIAGNOSIS — F250 Schizoaffective disorder, bipolar type: Secondary | ICD-10-CM | POA: Diagnosis present

## 2020-11-14 DIAGNOSIS — I5023 Acute on chronic systolic (congestive) heart failure: Secondary | ICD-10-CM | POA: Diagnosis present

## 2020-11-14 DIAGNOSIS — Z7902 Long term (current) use of antithrombotics/antiplatelets: Secondary | ICD-10-CM | POA: Diagnosis not present

## 2020-11-14 DIAGNOSIS — E11621 Type 2 diabetes mellitus with foot ulcer: Secondary | ICD-10-CM | POA: Diagnosis present

## 2020-11-14 DIAGNOSIS — E785 Hyperlipidemia, unspecified: Secondary | ICD-10-CM | POA: Diagnosis present

## 2020-11-14 DIAGNOSIS — F17210 Nicotine dependence, cigarettes, uncomplicated: Secondary | ICD-10-CM | POA: Diagnosis present

## 2020-11-14 DIAGNOSIS — I509 Heart failure, unspecified: Secondary | ICD-10-CM

## 2020-11-14 DIAGNOSIS — I110 Hypertensive heart disease with heart failure: Secondary | ICD-10-CM | POA: Diagnosis not present

## 2020-11-14 DIAGNOSIS — L97519 Non-pressure chronic ulcer of other part of right foot with unspecified severity: Secondary | ICD-10-CM | POA: Diagnosis present

## 2020-11-14 DIAGNOSIS — L03115 Cellulitis of right lower limb: Secondary | ICD-10-CM | POA: Diagnosis present

## 2020-11-14 DIAGNOSIS — Z20822 Contact with and (suspected) exposure to covid-19: Secondary | ICD-10-CM | POA: Diagnosis present

## 2020-11-14 DIAGNOSIS — Z9119 Patient's noncompliance with other medical treatment and regimen: Secondary | ICD-10-CM | POA: Diagnosis not present

## 2020-11-14 DIAGNOSIS — Z5900 Homelessness unspecified: Secondary | ICD-10-CM | POA: Diagnosis not present

## 2020-11-14 DIAGNOSIS — J449 Chronic obstructive pulmonary disease, unspecified: Secondary | ICD-10-CM | POA: Diagnosis present

## 2020-11-14 DIAGNOSIS — Z79899 Other long term (current) drug therapy: Secondary | ICD-10-CM | POA: Diagnosis not present

## 2020-11-14 DIAGNOSIS — Z9114 Patient's other noncompliance with medication regimen: Secondary | ICD-10-CM | POA: Diagnosis not present

## 2020-11-14 DIAGNOSIS — F1424 Cocaine dependence with cocaine-induced mood disorder: Secondary | ICD-10-CM | POA: Diagnosis present

## 2020-11-14 DIAGNOSIS — Z886 Allergy status to analgesic agent status: Secondary | ICD-10-CM | POA: Diagnosis not present

## 2020-11-14 LAB — GLUCOSE, CAPILLARY
Glucose-Capillary: 136 mg/dL — ABNORMAL HIGH (ref 70–99)
Glucose-Capillary: 169 mg/dL — ABNORMAL HIGH (ref 70–99)

## 2020-11-14 LAB — CBC
HCT: 35.4 % — ABNORMAL LOW (ref 39.0–52.0)
Hemoglobin: 11.6 g/dL — ABNORMAL LOW (ref 13.0–17.0)
MCH: 32.8 pg (ref 26.0–34.0)
MCHC: 32.8 g/dL (ref 30.0–36.0)
MCV: 100 fL (ref 80.0–100.0)
Platelets: 200 10*3/uL (ref 150–400)
RBC: 3.54 MIL/uL — ABNORMAL LOW (ref 4.22–5.81)
RDW: 15.6 % — ABNORMAL HIGH (ref 11.5–15.5)
WBC: 7.8 10*3/uL (ref 4.0–10.5)
nRBC: 0 % (ref 0.0–0.2)

## 2020-11-14 LAB — COMPREHENSIVE METABOLIC PANEL
ALT: 25 U/L (ref 0–44)
AST: 20 U/L (ref 15–41)
Albumin: 2.8 g/dL — ABNORMAL LOW (ref 3.5–5.0)
Alkaline Phosphatase: 65 U/L (ref 38–126)
Anion gap: 8 (ref 5–15)
BUN: 19 mg/dL (ref 6–20)
CO2: 25 mmol/L (ref 22–32)
Calcium: 8.2 mg/dL — ABNORMAL LOW (ref 8.9–10.3)
Chloride: 105 mmol/L (ref 98–111)
Creatinine, Ser: 1.43 mg/dL — ABNORMAL HIGH (ref 0.61–1.24)
GFR, Estimated: 59 mL/min — ABNORMAL LOW (ref >60)
Glucose, Bld: 107 mg/dL — ABNORMAL HIGH (ref 70–99)
Potassium: 4.1 mmol/L (ref 3.5–5.1)
Sodium: 138 mmol/L (ref 135–145)
Total Bilirubin: 0.9 mg/dL (ref 0.3–1.2)
Total Protein: 5.4 g/dL — ABNORMAL LOW (ref 6.5–8.1)

## 2020-11-14 MED ORDER — CLONIDINE HCL 0.1 MG PO TABS
0.1000 mg | ORAL_TABLET | Freq: Three times a day (TID) | ORAL | Status: DC
  Administered 2020-11-14 – 2020-11-15 (×4): 0.1 mg via ORAL
  Filled 2020-11-14 (×5): qty 1

## 2020-11-14 MED ORDER — CLOPIDOGREL BISULFATE 75 MG PO TABS
75.0000 mg | ORAL_TABLET | Freq: Every day | ORAL | Status: DC
  Administered 2020-11-14 – 2020-11-15 (×2): 75 mg via ORAL
  Filled 2020-11-14 (×3): qty 1

## 2020-11-14 MED ORDER — TRAZODONE HCL 50 MG PO TABS
50.0000 mg | ORAL_TABLET | Freq: Every day | ORAL | Status: DC
  Filled 2020-11-14: qty 1

## 2020-11-14 MED ORDER — SPIRONOLACTONE 12.5 MG HALF TABLET
12.5000 mg | ORAL_TABLET | Freq: Every day | ORAL | Status: DC
  Administered 2020-11-14 – 2020-11-15 (×2): 12.5 mg via ORAL
  Filled 2020-11-14 (×3): qty 1

## 2020-11-14 MED ORDER — GABAPENTIN 100 MG PO CAPS
200.0000 mg | ORAL_CAPSULE | Freq: Two times a day (BID) | ORAL | Status: DC
  Administered 2020-11-14 – 2020-11-15 (×3): 200 mg via ORAL
  Filled 2020-11-14 (×4): qty 2

## 2020-11-14 MED ORDER — ATORVASTATIN CALCIUM 20 MG PO TABS
20.0000 mg | ORAL_TABLET | Freq: Every day | ORAL | Status: DC
  Administered 2020-11-14 – 2020-11-15 (×2): 20 mg via ORAL
  Filled 2020-11-14 (×2): qty 1

## 2020-11-14 MED ORDER — SENNOSIDES-DOCUSATE SODIUM 8.6-50 MG PO TABS: 1.0000 | ORAL_TABLET | Freq: Every evening | ORAL | Status: DC | PRN

## 2020-11-14 MED ORDER — ALBUTEROL SULFATE HFA 108 (90 BASE) MCG/ACT IN AERS: 2.0000 | INHALATION_SPRAY | Freq: Four times a day (QID) | RESPIRATORY_TRACT | Status: DC | PRN

## 2020-11-14 MED ORDER — DIVALPROEX SODIUM ER 500 MG PO TB24
500.0000 mg | ORAL_TABLET | Freq: Every day | ORAL | Status: DC
  Administered 2020-11-14 – 2020-11-15 (×2): 500 mg via ORAL
  Filled 2020-11-14 (×2): qty 1

## 2020-11-14 MED ORDER — LORAZEPAM 2 MG/ML IJ SOLN
1.0000 mg | INTRAMUSCULAR | Status: DC | PRN
  Administered 2020-11-14 – 2020-11-16 (×3): 1 mg via INTRAVENOUS
  Filled 2020-11-14 (×3): qty 1

## 2020-11-14 NOTE — ED Notes (Signed)
Patient continues to refuse to keep BP cuff on

## 2020-11-14 NOTE — Progress Notes (Signed)
Pt admitted to room 1434 from the ER. Skin assessment done by 2 RN s. Right and Left lower extremities with very dry flaky and cracking noted to bilateral feet areas. Both Right and left great toes at the bottom there is noted to be a laceration type area. There is not drainage noted to either area.

## 2020-11-14 NOTE — Progress Notes (Addendum)
PROGRESS NOTE  [name] WPY:[id number] DOB: Aug 18, 1966 DOA: 11/13/2020 PCP: [name]   LOS: 0 days   Brief narrative: As per HPI,  [name] is a 54 y.o. male with medical history significant of bipolar, COPD, DM2, HFrEF presented to hospital with dyspnea on exertion and leg swelling for 1 week.  Patient does have history of heart failure with reduced ejection fraction and medication noncompliance.  He states that he does occasional cocaine as well.  Had been to the emergency room for the same problem but then left AMA.  Again returned back to the hospital.  In the ED, patient was given IV Lasix.  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  MRI of the foot was ordered patient was started on IV Zosyn and patient was considered for admission to the hospital.  Assessment/Plan:  Active Problems:   CHF exacerbation (HCC)   Acute on chronic HFrEF  History of ongoing substance abuse.  Received IV Lasix.  Continue IV Lasix at this time.  Continue fluid restriction.  Hold Coreg for now.  Last cocaine use was a week back as per the patient.      Continue lisinopril, hydralazine, nitrates that the patient was supposed to be at home but is not taking.   Continue intake and output charting, daily weights.  Resume spironolactone  Bilateral foot calluses with right sided ulceration and cellulitis.  MRI of the toes on the right without any evidence of osteomyelitis.  We will get local wound care.  Patient might benefit from outpatient podiatry follow-up.  On IV Zosyn at this time.  Essential hypertension.   Coreg clonidine at home.  Will resume clonidine.  Hold Coreg.   hyperlipidemia.   Not on statin as per med rec.  COPD  Continue nebulizers, p.o. prednisone.  Medical non-compliance     - 13 admission or ED visit since September with usually the same CC     - multiple attempts to get him assistance with TOC     - not sure there is much we  are going to be able to do on this front given his psyc history.     TOC consulted again.   schizoaffective disorder.  Supposed to be on Depakote.  Has not started.  Resume while in the hospital.  Supposed to be on gabapentin.  Will resume.  DVT prophylaxis: enoxaparin (LOVENOX) injection 40 mg Start: 11/13/20 2200   Code Status: Full code  Family Communication: None  Status is: Observation  The patient will require care spanning > 2 midnights and should be moved to inpatient because: IV treatments appropriate due to intensity of illness or inability to take PO, Inpatient level of care appropriate due to severity of illness and CHF exacerbation, COPD, IV antibiotic, IV diuresis  Dispo: The patient is from: Home              Anticipated d/c is to: Home              Anticipated d/c date is: 2 days              Patient currently is not medically stable to d/c.  Consultants:  None  Procedures:  None  Antibiotics:  . Zosyn IV 12/14>  Anti-infectives (From admission, onward)   Start     Dose/Rate Route Frequency Ordered Stop   11/13/20 1800  piperacillin-tazobactam (ZOSYN) IVPB 3.375 g        3.375 g 12.5 mL/hr  over 240 Minutes Intravenous Every 8 hours 11/13/20 1256     11/13/20 1100  piperacillin-tazobactam (ZOSYN) IVPB 3.375 g        3.375 g 100 mL/hr over 30 Minutes Intravenous  Once 11/13/20 1057 11/13/20 1158     Subjective: Today, patient was seen and examined at bedside.  Constantly fidgety, anxious complains of pain over the bilateral foot.  States that he cannot breathe.  Objective: Vitals:   11/14/20 1324 11/14/20 1345  BP:    Pulse:  95  Resp:    Temp:    SpO2: 99% 100%    Intake/Output Summary (Last 24 hours) at 11/14/2020 1437 Last data filed at 11/14/2020 1300 Gross per 24 hour  Intake 326.97 ml  Output 1275 ml  Net -948.03 ml   Filed Weights   11/13/20 0716  Weight: 86.2 kg   Body mass index is 28.06 kg/m.   Physical Exam: GENERAL:  Patient is alert awake and oriented.  Constantly fidgety, anxious, HENT: No scleral pallor or icterus. Pupils equally reactive to light. Oral mucosa is moist NECK: is supple, no gross swelling noted. CHEST:   Diminished breath sounds bilaterally.   CVS: S1 and S2 heard, no murmur. Regular rate and rhythm.  ABDOMEN: Soft, non-tender, bowel sounds are present. EXTREMITIES: Bilateral lower extremity trace edema.  Lasix over the bilateral feet. CNS: Cranial nerves are intact. No focal motor deficits. SKIN: warm and dry, bilateral foot with no gross calluses, mild ulceration over the callus on the right.  Data Review: I have personally reviewed the following laboratory data and studies,  CBC: Recent Labs  Lab 11/12/20 1946 11/13/20 0925 11/14/20 0402  WBC 9.1 9.7 7.8  NEUTROABS 6.7 7.0  --   HGB 13.5 13.8 11.6*  HCT 40.5 41.7 35.4*  MCV 99.3 99.5 100.0  PLT 234 270 200   Basic Metabolic Panel: Recent Labs  Lab 11/12/20 1946 11/13/20 0925 11/14/20 0402  NA 141 139 138  K 3.9 4.3 4.1  CL 105 103 105  CO2 26 27 25   GLUCOSE 86 111* 107*  BUN 16 17 19   CREATININE 1.17 1.11 1.43*  CALCIUM 8.7* 8.7* 8.2*  MG  --  1.9  --    Liver Function Tests: Recent Labs  Lab 11/12/20 1946 11/13/20 0925 11/14/20 0402  AST 33 27 20  ALT 34 30 25  ALKPHOS 80 80 65  BILITOT 1.1 1.0 0.9  PROT 6.6 6.4* 5.4*  ALBUMIN 3.5 3.4* 2.8*   No results for input(s): LIPASE, AMYLASE in the last 168 hours. No results for input(s): AMMONIA in the last 168 hours. Cardiac Enzymes: No results for input(s): CKTOTAL, CKMB, CKMBINDEX, TROPONINI in the last 168 hours. BNP (last 3 results) Recent Labs    10/29/20 1300 11/05/20 2331 11/12/20 1946  BNP 322.3* 499.8* 1,692.4*    ProBNP (last 3 results) No results for input(s): PROBNP in the last 8760 hours.  CBG: No results for input(s): GLUCAP in the last 168 hours. Recent Results (from the past 240 hour(s))  Resp Panel by RT-PCR (Flu A&B, Covid)  Nasopharyngeal Swab     Status: None   Collection Time: 11/07/20 11:24 AM   Specimen: Nasopharyngeal Swab; Nasopharyngeal(NP) swabs in vial transport medium  Result Value Ref Range Status   SARS Coronavirus 2 by RT PCR NEGATIVE NEGATIVE Final    Comment: (NOTE) SARS-CoV-2 target nucleic acids are NOT DETECTED.  The SARS-CoV-2 RNA is generally detectable in upper respiratory specimens during the acute phase of infection.  The lowest concentration of SARS-CoV-2 viral copies this assay can detect is 138 copies/mL. A negative result does not preclude SARS-Cov-2 infection and should not be used as the sole basis for treatment or other patient management decisions. A negative result may occur with  improper specimen collection/handling, submission of specimen other than nasopharyngeal swab, presence of viral mutation(s) within the areas targeted by this assay, and inadequate number of viral copies(<138 copies/mL). A negative result must be combined with clinical observations, patient history, and epidemiological information. The expected result is Negative.  Fact Sheet for Patients:  BloggerCourse.com  Fact Sheet for Healthcare Providers:  SeriousBroker.it  This test is no t yet approved or cleared by the [place] FDA and  has been authorized for detection and/or diagnosis of SARS-CoV-2 by FDA under an Emergency Use Authorization (EUA). This EUA will remain  in effect (meaning this test can be used) for the duration of the COVID-19 declaration under Section 564(b)(1) of the Act, 21 U.S.C.section 360bbb-3(b)(1), unless the authorization is terminated  or revoked sooner.       Influenza A by PCR NEGATIVE NEGATIVE Final   Influenza B by PCR NEGATIVE NEGATIVE Final    Comment: (NOTE) The Xpert Xpress SARS-CoV-2/FLU/RSV plus assay is intended as an aid in the diagnosis of influenza from Nasopharyngeal swab specimens and should not be  used as a sole basis for treatment. Nasal washings and aspirates are unacceptable for Xpert Xpress SARS-CoV-2/FLU/RSV testing.  Fact Sheet for Patients: BloggerCourse.com  Fact Sheet for Healthcare Providers: SeriousBroker.it  This test is not yet approved or cleared by the [place] FDA and has been authorized for detection and/or diagnosis of SARS-CoV-2 by FDA under an Emergency Use Authorization (EUA). This EUA will remain in effect (meaning this test can be used) for the duration of the COVID-19 declaration under Section 564(b)(1) of the Act, 21 U.S.C. section 360bbb-3(b)(1), unless the authorization is terminated or revoked.  Performed at [facility], [address] [place], [name] [address]   Resp Panel by RT-PCR (Flu A&B, Covid) Nasopharyngeal Swab     Status: None   Collection Time: 11/13/20 11:50 AM   Specimen: Nasopharyngeal Swab; Nasopharyngeal(NP) swabs in vial transport medium  Result Value Ref Range Status   SARS Coronavirus 2 by RT PCR NEGATIVE NEGATIVE Final    Comment: (NOTE) SARS-CoV-2 target nucleic acids are NOT DETECTED.  The SARS-CoV-2 RNA is generally detectable in upper respiratory specimens during the acute phase of infection. The lowest concentration of SARS-CoV-2 viral copies this assay can detect is 138 copies/mL. A negative result does not preclude SARS-Cov-2 infection and should not be used as the sole basis for treatment or other patient management decisions. A negative result may occur with  improper specimen collection/handling, submission of specimen other than nasopharyngeal swab, presence of viral mutation(s) within the areas targeted by this assay, and inadequate number of viral copies(<138 copies/mL). A negative result must be combined with clinical observations, patient history, and epidemiological information. The expected result is Negative.  Fact Sheet for Patients:   BloggerCourse.com  Fact Sheet for Healthcare Providers:  SeriousBroker.it  This test is no t yet approved or cleared by the [place] FDA and  has been authorized for detection and/or diagnosis of SARS-CoV-2 by FDA under an Emergency Use Authorization (EUA). This EUA will remain  in effect (meaning this test can be used) for the duration of the COVID-19 declaration under Section 564(b)(1) of the Act, 21 U.S.C.section 360bbb-3(b)(1), unless the authorization is  terminated  or revoked sooner.       Influenza A by PCR NEGATIVE NEGATIVE Final   Influenza B by PCR NEGATIVE NEGATIVE Final    Comment: (NOTE) The Xpert Xpress SARS-CoV-2/FLU/RSV plus assay is intended as an aid in the diagnosis of influenza from Nasopharyngeal swab specimens and should not be used as a sole basis for treatment. Nasal washings and aspirates are unacceptable for Xpert Xpress SARS-CoV-2/FLU/RSV testing.  Fact Sheet for Patients: BloggerCourse.com  Fact Sheet for Healthcare Providers: SeriousBroker.it  This test is not yet approved or cleared by the [place] FDA and has been authorized for detection and/or diagnosis of SARS-CoV-2 by FDA under an Emergency Use Authorization (EUA). This EUA will remain in effect (meaning this test can be used) for the duration of the COVID-19 declaration under Section 564(b)(1) of the Act, 21 U.S.C. section 360bbb-3(b)(1), unless the authorization is terminated or revoked.  Performed at [facility], [address] [place], [name] [address]      Studies: DG Chest 2 View  Result Date: 11/13/2020 CLINICAL DATA:  Shortness of breath EXAM: CHEST - 2 VIEW COMPARISON:  Film from the previous day. FINDINGS: Cardiac shadow remains enlarged. Aortic calcifications are again seen. Lungs are well aerated bilaterally. No focal infiltrate or sizable  effusion is noted. IMPRESSION: No acute abnormality noted. Stable cardiomegaly. Electronically Signed   By: Alcide Clever M.D.   On: 11/13/2020 09:26   DG Chest 2 View  Result Date: 11/12/2020 CLINICAL DATA:  Shortness of breath EXAM: CHEST - 2 VIEW COMPARISON:  11/07/2020 FINDINGS: Cardiomegaly. No confluent airspace opacities, effusions or edema. No acute bony abnormality. IMPRESSION: Cardiomegaly.  No active disease. Electronically Signed   By: Charlett Nose M.D.   On: 11/12/2020 20:06   MR TOES RIGHT WO CONTRAST  Result Date: 11/13/2020 CLINICAL DATA:  Chronic ulceration on the right great toe. Question osteomyelitis. EXAM: MRI OF THE RIGHT TOES WITHOUT CONTRAST TECHNIQUE: Multiplanar, multisequence MR imaging of the toes of the right foot was performed. No intravenous contrast was administered. COMPARISON:  Plain films right foot today. FINDINGS: Bones/Joint/Cartilage There is no marrow edema to suggest osteomyelitis. No joint effusion. 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 Ligaments Intact. Muscles and Tendons Intact.  No intramuscular fluid collection. Soft tissues No abscess. Skin ulceration along the medial margin of the mid to distal great toe is noted. There is underlying subcutaneous edema but no abscess. IMPRESSION: Skin ulceration of the great toe with underlying cellulitis. Negative for osteomyelitis, abscess or septic joint. Electronically Signed   By: Drusilla Kanner M.D.   On: 11/13/2020 15:17   DG Foot Complete Right  Result Date: 11/13/2020 CLINICAL DATA:  Foot pain, no known injury, initial encounter EXAM: RIGHT FOOT COMPLETE - 3+ VIEW COMPARISON:  None. FINDINGS: Calcaneal spurring is seen. Mild tarsal degenerative changes are noted. No acute fracture or dislocation is identified. Mild soft tissue swelling is noted over the metacarpals. IMPRESSION: Mild soft tissue swelling and degenerative change. No acute bony abnormality is noted. Electronically Signed   By: Alcide Clever M.D.   On: 11/13/2020 09:25     Joycelyn Das, MD  [facility] 11/14/2020  If 7PM-7AM, please contact night-coverage

## 2020-11-14 NOTE — ED Notes (Signed)
Patient continues to take equipment needed for vitals off.

## 2020-11-14 NOTE — ED Notes (Signed)
Pt refused 2am duoneb after it was scanned, he decided he didn't want that dose

## 2020-11-14 NOTE — ED Notes (Signed)
Patient will not keep his BP cuff on

## 2020-11-14 NOTE — ED Notes (Signed)
Patient continues to take BP cuff and pulse ox off. RN asked patient to put it on and leave it on. Patient states he refuses to leave it on and not to touch him

## 2020-11-14 NOTE — ED Notes (Signed)
RN arrived to transport pt to 4W. Pt allowed RN to obtain vitals signs but refused to wear tele-monitoring, pulse ox, and BP cuff during transport. After arriving to 4W, RN unattached [place] from o2 tank and put down the bed railing. Pt then  stated to this RN "don't do anything else, I got it from here." Pt told this RN to not touch him or help him transfer from ED stretcher to hospital bed. RN stood by to ensure pt safely transferred from bed-to-bed.

## 2020-11-14 NOTE — Progress Notes (Signed)
Pt stated that his emergency contact is Alac and phone number [phone]. Pt requested I call him and leave a message of how to contact patient. Message was left for emergency contact.

## 2020-11-14 NOTE — Progress Notes (Signed)
Patient refused shift assessment, stated that he wants to be left alone.

## 2020-11-14 NOTE — Progress Notes (Signed)
Pt is declining RN to assess further Pt's bilateral great toe areas.

## 2020-11-15 DIAGNOSIS — I509 Heart failure, unspecified: Secondary | ICD-10-CM

## 2020-11-15 LAB — COMPREHENSIVE METABOLIC PANEL
ALT: 24 U/L (ref 0–44)
AST: 16 U/L (ref 15–41)
Albumin: 2.9 g/dL — ABNORMAL LOW (ref 3.5–5.0)
Alkaline Phosphatase: 66 U/L (ref 38–126)
Anion gap: 9 (ref 5–15)
BUN: 23 mg/dL — ABNORMAL HIGH (ref 6–20)
CO2: 25 mmol/L (ref 22–32)
Calcium: 8.7 mg/dL — ABNORMAL LOW (ref 8.9–10.3)
Chloride: 104 mmol/L (ref 98–111)
Creatinine, Ser: 1.25 mg/dL — ABNORMAL HIGH (ref 0.61–1.24)
GFR, Estimated: >60 mL/min (ref >60)
Glucose, Bld: 172 mg/dL — ABNORMAL HIGH (ref 70–99)
Potassium: 4 mmol/L (ref 3.5–5.1)
Sodium: 138 mmol/L (ref 135–145)
Total Bilirubin: 0.5 mg/dL (ref 0.3–1.2)
Total Protein: 5.9 g/dL — ABNORMAL LOW (ref 6.5–8.1)

## 2020-11-15 LAB — CBC
HCT: 37.1 % — ABNORMAL LOW (ref 39.0–52.0)
Hemoglobin: 12.1 g/dL — ABNORMAL LOW (ref 13.0–17.0)
MCH: 32.4 pg (ref 26.0–34.0)
MCHC: 32.6 g/dL (ref 30.0–36.0)
MCV: 99.5 fL (ref 80.0–100.0)
Platelets: 231 10*3/uL (ref 150–400)
RBC: 3.73 MIL/uL — ABNORMAL LOW (ref 4.22–5.81)
RDW: 15.3 % (ref 11.5–15.5)
WBC: 9.3 10*3/uL (ref 4.0–10.5)
nRBC: 0 % (ref 0.0–0.2)

## 2020-11-15 LAB — PHOSPHORUS: Phosphorus: 4 mg/dL (ref 2.5–4.6)

## 2020-11-15 LAB — MAGNESIUM: Magnesium: 2.2 mg/dL (ref 1.7–2.4)

## 2020-11-15 MED ORDER — MUPIROCIN CALCIUM 2 % EX CREA
TOPICAL_CREAM | Freq: Every day | CUTANEOUS | Status: DC
  Filled 2020-11-15: qty 15

## 2020-11-15 MED ORDER — IPRATROPIUM-ALBUTEROL 0.5-2.5 (3) MG/3ML IN SOLN
3.0000 mL | Freq: Four times a day (QID) | RESPIRATORY_TRACT | Status: DC | PRN
  Administered 2020-11-15: 3 mL via RESPIRATORY_TRACT
  Filled 2020-11-15: qty 3

## 2020-11-15 NOTE — Progress Notes (Signed)
PT requested to be placed on CPAP- completed with 2 LPM 02 bleed in. PT states he is comfortable at this time. RN aware and RT instructed RN to place PT on 2 LPM nasal cannula if CPAP is removed.

## 2020-11-15 NOTE — Consult Note (Addendum)
WOC Nurse Consult Note: Reason for Consult: Consult requested for bilat feet.  Pt has dry cracked raised callous areas to plantar great toes; affected callous area is approx 4X4cm on each side, and has a cracked full thickness fissure at the base of the toes; approx .2cm in depth and is dry,dark black and scabbed.  Pt could benefit from a podiatry consult to reduce callous areas; this is beyond the scope of practice for WOC nurses.  Secure chat message sent to the primary team to request if desired.  Dressing procedure/placement/frequency: Topical treatment orders provided for bedside nurses to perform daily as follows: Apply Bactroban to bilat plantar great toes, then cover with foam dressings.  (Change foam dressings Q 3 days or PRN soiling.) Please re-consult if further assistance is needed.  Thank-you,  Cammie Mcgee MSN, RN, CWOCN, [place], CNS [phone]

## 2020-11-15 NOTE — Progress Notes (Signed)
RN found scalpel at bedside sitting on bench. RN removed scalpel, reported to [facility] and Shawntay Prest CN. Scalpel placed in sharps container. Pt also wanted to see department CN and house [name]. Ronn Melena and Kathleen Likins CN entered room and patient stated that he did not need to speak to [place] and would speak with the director tomorrow. Patient was on phone. AC and CN respected patient wishes and excited room.

## 2020-11-15 NOTE — Progress Notes (Signed)
PROGRESS NOTE  [name] SJG:[id number] DOB: 02/18/1966 DOA: 11/13/2020 PCP: [name]   LOS: 1 day   Brief narrative: As per HPI,  [name] is a 54 y.o. male with medical history significant of bipolar, COPD, DM2, HFrEF presented to hospital with dyspnea on exertion and leg swelling for 1 week.  Patient does have history of heart failure with reduced ejection fraction and medication noncompliance.  He states that he does occasional cocaine as well.  Had been to the emergency room for the same problem but then left AMA.  Again returned back to the hospital.  In the ED, patient was given IV Lasix.  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  MRI of the foot was ordered patient was started on IV Zosyn and patient was considered for admission to the hospital.  Assessment/Plan:  Principal Problem:   CHF exacerbation (HCC) Active Problems:   Schizoaffective disorder, bipolar type (HCC)   Cocaine use disorder, severe, dependence (HCC)   Essential hypertension   Substance induced mood disorder (HCC)   Asthma   CHF (congestive heart failure) (HCC)   Acute on chronic HFrEF  History of ongoing substance abuse.  Received IV Lasix.  Continue IV Lasix at this time.  Continue fluid restriction.  Hold Coreg for now.  Last cocaine use was a week back as per the patient.      Continue lisinopril, hydralazine, nitrates that the patient was supposed to be at home but is not taking.   Continue intake and output charting, daily weights.  Resumed spironolactone.  Feels a little better today.  Patient has history of noncompliance and request of the medications to be prescribed again.  Bilateral foot calluses with right sided ulceration and cellulitis.  MRI of the toes on the right without any evidence of osteomyelitis.  We will get local wound care.  Patient might benefit from outpatient podiatry follow-up.  On IV Zosyn at this time.  Will de-escalate  antibiotic on discharge.  Essential hypertension.   Coreg, clonidine, hydralazine, lisinopril, nitrates at home.  Has been resumed.  Hold Coreg for now   hyperlipidemia.   Not on statin as per med rec.of noncompliance.  COPD  Continue nebulizers, p.o. prednisone.  Medical non-compliance     -Multiple visits to the hospital more than 10 times recently.  Condition of care has been consulted.   schizoaffective disorder.  Supposed to be on Depakote.  Has not started.  Resume while in the hospital.  Supposed to be on gabapentin.  Condition.  DVT prophylaxis: enoxaparin (LOVENOX) injection 40 mg Start: 11/13/20 2200   Code Status: Full code  Family Communication: None  Status is: Inpatient  The patient is inpatient because: IV treatments appropriate due to intensity of illness or inability to take PO, Inpatient level of care appropriate due to severity of illness and CHF exacerbation, COPD, IV antibiotic, IV diuresis  Dispo: The patient is from: Home              Anticipated d/c is to: Home              Anticipated d/c date is: 1 to 2 days              Patient currently is not medically stable to d/c.  Consultants:  None  Procedures:  None  Antibiotics:  . Zosyn IV 12/14>  Anti-infectives (From admission, onward)   Start     Dose/Rate Route Frequency Ordered Stop  11/13/20 1800  piperacillin-tazobactam (ZOSYN) IVPB 3.375 g        3.375 g 12.5 mL/hr over 240 Minutes Intravenous Every 8 hours 11/13/20 1256     11/13/20 1100  piperacillin-tazobactam (ZOSYN) IVPB 3.375 g        3.375 g 100 mL/hr over 30 Minutes Intravenous  Once 11/13/20 1057 11/13/20 1158     Subjective: Today, patient was seen and examined at bedside.  States that that he is got pain in his foot.  Breathing is better as per the patient.  Requesting all medications to be prescribed on discharge.    Objective: Vitals:   11/14/20 2037 11/15/20 0110  BP: 133/85 134/76  Pulse: 84 74  Resp: 20    Temp:    SpO2: 98% 99%    Intake/Output Summary (Last 24 hours) at 11/15/2020 1040 Last data filed at 11/15/2020 1000 Gross per 24 hour  Intake 1726 ml  Output 4925 ml  Net -3199 ml   Filed Weights   11/13/20 0716 11/14/20 1700  Weight: 86.2 kg 90.4 kg   Body mass index is 29.43 kg/m.   Physical Exam: GENERAL: Patient is alert awake and communicative..  Anxious constantly fidgety HENT: No scleral pallor or icterus. Pupils equally reactive to light. Oral mucosa is moist NECK: is supple, no gross swelling noted. CHEST:   Diminished breath sounds bilaterally.  No obvious crackles noted. CVS: S1 and S2 heard, no murmur. Regular rate and rhythm.  ABDOMEN: Soft, non-tender, bowel sounds are present. EXTREMITIES: Bilateral lower extremity trace edema.  Calluses over the bilateral foot. CNS: Cranial nerves are intact. No focal motor deficits. SKIN: warm and dry, bilateral foot with no gross calluses, mild ulceration over the callus on the right.  Data Review: I have personally reviewed the following laboratory data and studies,  CBC: Recent Labs  Lab 11/12/20 1946 11/13/20 0925 11/14/20 0402 11/15/20 0437  WBC 9.1 9.7 7.8 9.3  NEUTROABS 6.7 7.0  --   --   HGB 13.5 13.8 11.6* 12.1*  HCT 40.5 41.7 35.4* 37.1*  MCV 99.3 99.5 100.0 99.5  PLT 234 270 200 231   Basic Metabolic Panel: Recent Labs  Lab 11/12/20 1946 11/13/20 0925 11/14/20 0402 11/15/20 0437  NA 141 139 138 138  K 3.9 4.3 4.1 4.0  CL 105 103 105 104  CO2 26 27 25 25   GLUCOSE 86 111* 107* 172*  BUN 16 17 19  23*  CREATININE 1.17 1.11 1.43* 1.25*  CALCIUM 8.7* 8.7* 8.2* 8.7*  MG  --  1.9  --  2.2  PHOS  --   --   --  4.0   Liver Function Tests: Recent Labs  Lab 11/12/20 1946 11/13/20 0925 11/14/20 0402 11/15/20 0437  AST 33 27 20 16   ALT 34 30 25 24   ALKPHOS 80 80 65 66  BILITOT 1.1 1.0 0.9 0.5  PROT 6.6 6.4* 5.4* 5.9*  ALBUMIN 3.5 3.4* 2.8* 2.9*   No results for input(s): LIPASE, AMYLASE in  the last 168 hours. No results for input(s): AMMONIA in the last 168 hours. Cardiac Enzymes: No results for input(s): CKTOTAL, CKMB, CKMBINDEX, TROPONINI in the last 168 hours. BNP (last 3 results) Recent Labs    10/29/20 1300 11/05/20 2331 11/12/20 1946  BNP 322.3* 499.8* 1,692.4*    ProBNP (last 3 results) No results for input(s): PROBNP in the last 8760 hours.  CBG: Recent Labs  Lab 11/14/20 1636 11/14/20 2030  GLUCAP 136* 169*   Recent Results (from  the past 240 hour(s))  Resp Panel by RT-PCR (Flu A&B, Covid) Nasopharyngeal Swab     Status: None   Collection Time: 11/07/20 11:24 AM   Specimen: Nasopharyngeal Swab; Nasopharyngeal(NP) swabs in vial transport medium  Result Value Ref Range Status   SARS Coronavirus 2 by RT PCR NEGATIVE NEGATIVE Final    Comment: (NOTE) SARS-CoV-2 target nucleic acids are NOT DETECTED.  The SARS-CoV-2 RNA is generally detectable in upper respiratory specimens during the acute phase of infection. The lowest concentration of SARS-CoV-2 viral copies this assay can detect is 138 copies/mL. A negative result does not preclude SARS-Cov-2 infection and should not be used as the sole basis for treatment or other patient management decisions. A negative result may occur with  improper specimen collection/handling, submission of specimen other than nasopharyngeal swab, presence of viral mutation(s) within the areas targeted by this assay, and inadequate number of viral copies(<138 copies/mL). A negative result must be combined with clinical observations, patient history, and epidemiological information. The expected result is Negative.  Fact Sheet for Patients:  BloggerCourse.com  Fact Sheet for Healthcare Providers:  SeriousBroker.it  This test is no t yet approved or cleared by the [place] FDA and  has been authorized for detection and/or diagnosis of SARS-CoV-2 by FDA under an  Emergency Use Authorization (EUA). This EUA will remain  in effect (meaning this test can be used) for the duration of the COVID-19 declaration under Section 564(b)(1) of the Act, 21 U.S.C.section 360bbb-3(b)(1), unless the authorization is terminated  or revoked sooner.       Influenza A by PCR NEGATIVE NEGATIVE Final   Influenza B by PCR NEGATIVE NEGATIVE Final    Comment: (NOTE) The Xpert Xpress SARS-CoV-2/FLU/RSV plus assay is intended as an aid in the diagnosis of influenza from Nasopharyngeal swab specimens and should not be used as a sole basis for treatment. Nasal washings and aspirates are unacceptable for Xpert Xpress SARS-CoV-2/FLU/RSV testing.  Fact Sheet for Patients: BloggerCourse.com  Fact Sheet for Healthcare Providers: SeriousBroker.it  This test is not yet approved or cleared by the [place] FDA and has been authorized for detection and/or diagnosis of SARS-CoV-2 by FDA under an Emergency Use Authorization (EUA). This EUA will remain in effect (meaning this test can be used) for the duration of the COVID-19 declaration under Section 564(b)(1) of the Act, 21 U.S.C. section 360bbb-3(b)(1), unless the authorization is terminated or revoked.  Performed at [facility], [address], [place], [name] [address]   Resp Panel by RT-PCR (Flu A&B, Covid) Nasopharyngeal Swab     Status: None   Collection Time: 11/13/20 11:50 AM   Specimen: Nasopharyngeal Swab; Nasopharyngeal(NP) swabs in vial transport medium  Result Value Ref Range Status   SARS Coronavirus 2 by RT PCR NEGATIVE NEGATIVE Final    Comment: (NOTE) SARS-CoV-2 target nucleic acids are NOT DETECTED.  The SARS-CoV-2 RNA is generally detectable in upper respiratory specimens during the acute phase of infection. The lowest concentration of SARS-CoV-2 viral copies this assay can detect is 138 copies/mL. A negative result does not preclude  SARS-Cov-2 infection and should not be used as the sole basis for treatment or other patient management decisions. A negative result may occur with  improper specimen collection/handling, submission of specimen other than nasopharyngeal swab, presence of viral mutation(s) within the areas targeted by this assay, and inadequate number of viral copies(<138 copies/mL). A negative result must be combined with clinical observations, patient history, and epidemiological information. The expected result  is Negative.  Fact Sheet for Patients:  BloggerCourse.com  Fact Sheet for Healthcare Providers:  SeriousBroker.it  This test is no t yet approved or cleared by the [place] FDA and  has been authorized for detection and/or diagnosis of SARS-CoV-2 by FDA under an Emergency Use Authorization (EUA). This EUA will remain  in effect (meaning this test can be used) for the duration of the COVID-19 declaration under Section 564(b)(1) of the Act, 21 U.S.C.section 360bbb-3(b)(1), unless the authorization is terminated  or revoked sooner.       Influenza A by PCR NEGATIVE NEGATIVE Final   Influenza B by PCR NEGATIVE NEGATIVE Final    Comment: (NOTE) The Xpert Xpress SARS-CoV-2/FLU/RSV plus assay is intended as an aid in the diagnosis of influenza from Nasopharyngeal swab specimens and should not be used as a sole basis for treatment. Nasal washings and aspirates are unacceptable for Xpert Xpress SARS-CoV-2/FLU/RSV testing.  Fact Sheet for Patients: BloggerCourse.com  Fact Sheet for Healthcare Providers: SeriousBroker.it  This test is not yet approved or cleared by the [place] FDA and has been authorized for detection and/or diagnosis of SARS-CoV-2 by FDA under an Emergency Use Authorization (EUA). This EUA will remain in effect (meaning this test can be used) for the duration of  the COVID-19 declaration under Section 564(b)(1) of the Act, 21 U.S.C. section 360bbb-3(b)(1), unless the authorization is terminated or revoked.  Performed at [facility], [address] [place], [name] [address]      Studies: MR TOES RIGHT WO CONTRAST  Result Date: 11/13/2020 CLINICAL DATA:  Chronic ulceration on the right great toe. Question osteomyelitis. EXAM: MRI OF THE RIGHT TOES WITHOUT CONTRAST TECHNIQUE: Multiplanar, multisequence MR imaging of the toes of the right foot was performed. No intravenous contrast was administered. COMPARISON:  Plain films right foot today. FINDINGS: Bones/Joint/Cartilage There is no marrow edema to suggest osteomyelitis. No joint effusion. 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 Ligaments Intact. Muscles and Tendons Intact.  No intramuscular fluid collection. Soft tissues No abscess. Skin ulceration along the medial margin of the mid to distal great toe is noted. There is underlying subcutaneous edema but no abscess. IMPRESSION: Skin ulceration of the great toe with underlying cellulitis. Negative for osteomyelitis, abscess or septic joint. Electronically Signed   By: Drusilla Kanner M.D.   On: 11/13/2020 15:17     Joycelyn Das, MD  [facility] 11/15/2020  If 7PM-7AM, please contact night-coverage

## 2020-11-15 NOTE — Progress Notes (Signed)
Patient refused vitals or daily weight, disconnected IV tube from the IV line and refused IV flushing.
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
## 2020-11-15 NOTE — Progress Notes (Addendum)
Patient verbally aggressive with staff. NT requested for patient to refrain from using disrespectful speech. Pt started to become visibly angry. RN, CN & AC notified. Pt demanded assigned NT to not enter room further. Will continue to monitor.

## 2020-11-15 NOTE — Progress Notes (Signed)
Pt declined assistance with nocturnal cpap at this time.  Pt was encouraged to call when ready for bed.

## 2020-11-15 NOTE — Progress Notes (Signed)
Patient continue to ask for food and drinks about every hour, not receptive to CHF education/management.

## 2020-11-16 LAB — BASIC METABOLIC PANEL
Anion gap: 14 (ref 5–15)
BUN: 24 mg/dL — ABNORMAL HIGH (ref 6–20)
CO2: 26 mmol/L (ref 22–32)
Calcium: 9.2 mg/dL (ref 8.9–10.3)
Chloride: 100 mmol/L (ref 98–111)
Creatinine, Ser: 1.47 mg/dL — ABNORMAL HIGH (ref 0.61–1.24)
GFR, Estimated: 57 mL/min — ABNORMAL LOW (ref >60)
Glucose, Bld: 303 mg/dL — ABNORMAL HIGH (ref 70–99)
Potassium: 3.6 mmol/L (ref 3.5–5.1)
Sodium: 140 mmol/L (ref 135–145)

## 2020-11-16 LAB — CBC
HCT: 46.7 % (ref 39.0–52.0)
Hemoglobin: 15.1 g/dL (ref 13.0–17.0)
MCH: 32.9 pg (ref 26.0–34.0)
MCHC: 32.3 g/dL (ref 30.0–36.0)
MCV: 101.7 fL — ABNORMAL HIGH (ref 80.0–100.0)
Platelets: 301 10*3/uL (ref 150–400)
RBC: 4.59 MIL/uL (ref 4.22–5.81)
RDW: 15.2 % (ref 11.5–15.5)
WBC: 9.6 10*3/uL (ref 4.0–10.5)
nRBC: 0 % (ref 0.0–0.2)

## 2020-11-16 LAB — PHOSPHORUS: Phosphorus: 3.8 mg/dL (ref 2.5–4.6)

## 2020-11-16 LAB — MAGNESIUM: Magnesium: 2.1 mg/dL (ref 1.7–2.4)

## 2020-11-16 MED ORDER — INCRUSE ELLIPTA 62.5 MCG/INH IN AEPB: 1.0000 | INHALATION_SPRAY | Freq: Every day | RESPIRATORY_TRACT | 2 refills | Status: DC

## 2020-11-16 MED ORDER — ISOSORBIDE MONONITRATE ER 30 MG PO TB24: 30.0000 mg | ORAL_TABLET | Freq: Every day | ORAL | 2 refills | Status: DC

## 2020-11-16 MED ORDER — BACITRACIN-NEOMYCIN-POLYMYXIN 400-5-5000 EX OINT: 1.0000 "application " | TOPICAL_OINTMENT | Freq: Two times a day (BID) | CUTANEOUS | 0 refills | Status: DC

## 2020-11-16 MED ORDER — HYDRALAZINE HCL 50 MG PO TABS: 50.0000 mg | ORAL_TABLET | Freq: Three times a day (TID) | ORAL | 2 refills | Status: DC

## 2020-11-16 MED ORDER — SPIRONOLACTONE 25 MG PO TABS: 12.5000 mg | ORAL_TABLET | Freq: Every day | ORAL | 2 refills | Status: DC

## 2020-11-16 MED ORDER — ATORVASTATIN CALCIUM 20 MG PO TABS: 20.0000 mg | ORAL_TABLET | Freq: Every day | ORAL | 2 refills | Status: DC

## 2020-11-16 MED ORDER — TRAZODONE HCL 50 MG PO TABS: 50.0000 mg | ORAL_TABLET | Freq: Every day | ORAL | 2 refills | Status: DC

## 2020-11-16 MED ORDER — LISINOPRIL 20 MG PO TABS: 20.0000 mg | ORAL_TABLET | Freq: Two times a day (BID) | ORAL | 2 refills | Status: DC

## 2020-11-16 MED ORDER — CARVEDILOL 3.125 MG PO TABS: 3.1250 mg | ORAL_TABLET | Freq: Two times a day (BID) | ORAL | 0 refills | Status: DC

## 2020-11-16 MED ORDER — DIVALPROEX SODIUM ER 500 MG PO TB24: 500.0000 mg | ORAL_TABLET | Freq: Two times a day (BID) | ORAL | 2 refills | Status: DC

## 2020-11-16 MED ORDER — CLOPIDOGREL BISULFATE 75 MG PO TABS: 75.0000 mg | ORAL_TABLET | Freq: Every day | ORAL | 2 refills | Status: DC

## 2020-11-16 MED ORDER — FUROSEMIDE 40 MG PO TABS: 40.0000 mg | ORAL_TABLET | Freq: Every day | ORAL | 2 refills | Status: DC

## 2020-11-16 MED ORDER — CLONIDINE HCL 0.1 MG PO TABS: 0.1000 mg | ORAL_TABLET | Freq: Three times a day (TID) | ORAL | 0 refills | Status: DC

## 2020-11-16 MED ORDER — GABAPENTIN 100 MG PO CAPS: 200.0000 mg | ORAL_CAPSULE | Freq: Two times a day (BID) | ORAL | 2 refills | Status: DC

## 2020-11-16 MED ORDER — ALBUTEROL SULFATE HFA 108 (90 BASE) MCG/ACT IN AERS: 2.0000 | INHALATION_SPRAY | Freq: Four times a day (QID) | RESPIRATORY_TRACT | 2 refills | Status: DC | PRN

## 2020-11-16 NOTE — TOC Progression Note (Signed)
Transition of Care [facility]) - Progression Note    Patient Details  Name: [name] MRN: [id number] Date of Birth: December 08, 1965  Transition of Care [facility]) CM/SW Contact  Geni Bers, RN Phone Number: 11/16/2020, 10:11 AM  Clinical Narrative:     Pt discharged with no needs. [place] and [place] bus pass given to pt.         Expected Discharge Plan and Services           Expected Discharge Date: 11/16/20                                     Social Determinants of Health (SDOH) Interventions    Readmission Risk Interventions Readmission Risk Prevention Plan 10/15/2020 10/05/2020 07/23/2020  Transportation Screening Complete Complete Complete  PCP or Specialist Appt within 3-5 Days - - Complete  HRI or Home Care Consult - - Complete  Social Work Consult for Recovery Care Planning/Counseling - - Complete  Palliative Care Screening - - Not Applicable  Medication Review [employer]) Complete Referral to Pharmacy Complete  PCP or Specialist appointment within 3-5 days of discharge Complete Complete -  HRI or Home Care Consult Complete Patient refused -  SW Recovery Care/Counseling Consult Complete Complete -  Palliative Care Screening Not Applicable Not Applicable -  Skilled Nursing Facility Not Applicable Not Applicable -  Some recent data might be hidden

## 2020-11-16 NOTE — Plan of Care (Signed)

## 2020-11-16 NOTE — Discharge Summary (Signed)
Physician Discharge Summary  Oral Remache TKW:[id number] DOB: 03/30/1966 DOA: 11/13/2020  PCP: [name]  Admit date: 11/13/2020 Discharge date: 11/16/2020  Admitted From: Home  Discharge disposition: Home   Recommendations for Outpatient Follow-Up:   . Follow up with your primary care provider in one week.  . Check CBC, BMP, magnesium in the next visit . Patient has difficulty with compliance to medications.  All the medications have been prescribed to the patient  Discharge Diagnosis:   Principal Problem:   CHF exacerbation (HCC) Active Problems:   Schizoaffective disorder, bipolar type (HCC)   Cocaine use disorder, severe, dependence (HCC)   Essential hypertension   Substance induced mood disorder (HCC)   Asthma   CHF (congestive heart failure) (HCC)   Discharge Condition: Improved.  Diet recommendation: Low sodium, heart healthy.    Wound care: Local foot care  Code status: Full.   History of Present Illness:   [name] a 54 y.o.malewith medical history significant ofbipolar, COPD, DM2, HFrEF presented to hospital with dyspnea on exertion and leg swelling for 1 week.  Patient does have history of heart failure with reduced ejection fraction and medication noncompliance.  He states that he does occasional cocaine as well.  Had been to the emergency room for the same problem but then left AMA.  Again returned back to the hospital.  In the ED, patient was given IV Lasix.  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  MRI of the foot was ordered patient was started on IV Zosyn and patient was considered for admission to the hospital.   Hospital Course:   Following conditions were addressed during hospitalization as listed below,  Acute on chronic HFrEF History of ongoing substance abuse.  Received IV Lasix..  Will be resumed on Lasix on discharge.   History of cocaine abuse.  Counseled about it. Continue  lisinopril, hydralazine, nitrates from home.  All the medications have been prescribed again.    Bilateral foot calluses with right sided ulceration and cellulitis.  MRI of the toes on the right without any evidence of osteomyelitis.    Initially was put on IV antibiotic.  There is more of a crack and fissuring at this time.  Supportive care.  Essential hypertension. Coreg, clonidine, hydralazine, lisinopril, nitrates at home.  Resume on discharge.   hyperlipidemia. Not on statin as per med rec.history of  noncompliance.  COPD Continue inhalers.  Medical non-compliance -Multiple visits to the hospital more than 10 times recently.    High likelihood of recurrent admissions.   schizoaffective disorder.  Supposed to be on Depakote.  Has not started.  Resume while in the hospital.  Supposed to be on gabapentin.    These medications have been represcribed.   Disposition.  At this time, patient is stable for disposition home.  He will need to follow-up with his primary care physician as outpatient.  Medical Consultants:    None.  Procedures:    None Subjective:   Today, patient states that he feels better and wishes to go home right now.  Denies pain, nausea vomiting.  Discharge Exam:   Vitals:   11/15/20 1452 11/15/20 1500  BP:    Pulse:    Resp:    SpO2: 96% 95%   Vitals:   11/15/20 0110 11/15/20 1436 11/15/20 1452 11/15/20 1500  BP: 134/76 124/74    Pulse: 74 80    Resp:      TempSrc:  SpO2: 99% 99% 96% 95%  Weight:      Height:       General: Alert awake, not in obvious distress, fidgety, HENT: pupils equally reacting to light,  No scleral pallor or icterus noted. Oral mucosa is moist.  Chest:  .  Diminished breath sounds bilaterally. No crackles or wheezes.  CVS: S1 &S2 heard. No murmur.  Regular rate and rhythm. Abdomen: Soft, nontender, nondistended.  Bowel sounds are heard.   Extremities: No cyanosis, clubbing but trace edema.  Calluses over  the bilateral foot..  Peripheral pulses are palpable. Psych: Alert, awake and communicative, little anxious CNS:  No cranial nerve deficits.  Power equal in all extremities.   Skin: Warm and dry.  Bilateral foot calluses.  The results of significant diagnostics from this hospitalization (including imaging, microbiology, ancillary and laboratory) are listed below for reference.     Diagnostic Studies:   DG Chest 2 View  Result Date: 11/13/2020 CLINICAL DATA:  Shortness of breath EXAM: CHEST - 2 VIEW COMPARISON:  Film from the previous day. FINDINGS: Cardiac shadow remains enlarged. Aortic calcifications are again seen. Lungs are well aerated bilaterally. No focal infiltrate or sizable effusion is noted. IMPRESSION: No acute abnormality noted. Stable cardiomegaly. Electronically Signed   By: Alcide Clever M.D.   On: 11/13/2020 09:26   MR TOES RIGHT WO CONTRAST  Result Date: 11/13/2020 CLINICAL DATA:  Chronic ulceration on the right great toe. Question osteomyelitis. EXAM: MRI OF THE RIGHT TOES WITHOUT CONTRAST TECHNIQUE: Multiplanar, multisequence MR imaging of the toes of the right foot was performed. No intravenous contrast was administered. COMPARISON:  Plain films right foot today. FINDINGS: Bones/Joint/Cartilage There is no marrow edema to suggest osteomyelitis. No joint effusion. 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 Ligaments Intact. Muscles and Tendons Intact.  No intramuscular fluid collection. Soft tissues No abscess. Skin ulceration along the medial margin of the mid to distal great toe is noted. There is underlying subcutaneous edema but no abscess. IMPRESSION: Skin ulceration of the great toe with underlying cellulitis. Negative for osteomyelitis, abscess or septic joint. Electronically Signed   By: Drusilla Kanner M.D.   On: 11/13/2020 15:17   DG Foot Complete Right  Result Date: 11/13/2020 CLINICAL DATA:  Foot pain, no known injury, initial encounter EXAM: RIGHT FOOT  COMPLETE - 3+ VIEW COMPARISON:  None. FINDINGS: Calcaneal spurring is seen. Mild tarsal degenerative changes are noted. No acute fracture or dislocation is identified. Mild soft tissue swelling is noted over the metacarpals. IMPRESSION: Mild soft tissue swelling and degenerative change. No acute bony abnormality is noted. Electronically Signed   By: Alcide Clever M.D.   On: 11/13/2020 09:25     Labs:   Basic Metabolic Panel: Recent Labs  Lab 11/12/20 1946 11/13/20 0925 11/14/20 0402 11/15/20 0437  NA 141 139 138 138  K 3.9 4.3 4.1 4.0  CL 105 103 105 104  CO2 26 27 25 25   GLUCOSE 86 111* 107* 172*  BUN 16 17 19  23*  CREATININE 1.17 1.11 1.43* 1.25*  CALCIUM 8.7* 8.7* 8.2* 8.7*  MG  --  1.9  --  2.2  PHOS  --   --   --  4.0   GFR Estimated Creatinine Clearance: 76.9 mL/min (A) (by C-G formula based on SCr of 1.25 mg/dL (H)). Liver Function Tests: Recent Labs  Lab 11/12/20 1946 11/13/20 0925 11/14/20 0402 11/15/20 0437  AST 33 27 20 16   ALT 34 30 25 24   ALKPHOS 80 80  65 66  BILITOT 1.1 1.0 0.9 0.5  PROT 6.6 6.4* 5.4* 5.9*  ALBUMIN 3.5 3.4* 2.8* 2.9*   No results for input(s): LIPASE, AMYLASE in the last 168 hours. No results for input(s): AMMONIA in the last 168 hours. Coagulation profile No results for input(s): INR, PROTIME in the last 168 hours.  CBC: Recent Labs  Lab 11/12/20 1946 11/13/20 0925 11/14/20 0402 11/15/20 0437 11/16/20 0859  WBC 9.1 9.7 7.8 9.3 9.6  NEUTROABS 6.7 7.0  --   --   --   HGB 13.5 13.8 11.6* 12.1* 15.1  HCT 40.5 41.7 35.4* 37.1* 46.7  MCV 99.3 99.5 100.0 99.5 101.7*  PLT 234 270 200 231 301   Cardiac Enzymes: No results for input(s): CKTOTAL, CKMB, CKMBINDEX, TROPONINI in the last 168 hours. BNP: Invalid input(s): POCBNP CBG: Recent Labs  Lab 11/14/20 1636 11/14/20 2030  GLUCAP 136* 169*   D-Dimer No results for input(s): DDIMER in the last 72 hours. Hgb A1c No results for input(s): HGBA1C in the last 72 hours. Lipid  Profile No results for input(s): CHOL, HDL, LDLCALC, TRIG, CHOLHDL, LDLDIRECT in the last 72 hours. Thyroid function studies No results for input(s): TSH, T4TOTAL, T3FREE, THYROIDAB in the last 72 hours.  Invalid input(s): FREET3 Anemia work up No results for input(s): VITAMINB12, FOLATE, FERRITIN, TIBC, IRON, RETICCTPCT in the last 72 hours. Microbiology Recent Results (from the past 240 hour(s))  Resp Panel by RT-PCR (Flu A&B, Covid) Nasopharyngeal Swab     Status: None   Collection Time: 11/07/20 11:24 AM   Specimen: Nasopharyngeal Swab; Nasopharyngeal(NP) swabs in vial transport medium  Result Value Ref Range Status   SARS Coronavirus 2 by RT PCR NEGATIVE NEGATIVE Final    Comment: (NOTE) SARS-CoV-2 target nucleic acids are NOT DETECTED.  The SARS-CoV-2 RNA is generally detectable in upper respiratory specimens during the acute phase of infection. The lowest concentration of SARS-CoV-2 viral copies this assay can detect is 138 copies/mL. A negative result does not preclude SARS-Cov-2 infection and should not be used as the sole basis for treatment or other patient management decisions. A negative result may occur with  improper specimen collection/handling, submission of specimen other than nasopharyngeal swab, presence of viral mutation(s) within the areas targeted by this assay, and inadequate number of viral copies(<138 copies/mL). A negative result must be combined with clinical observations, patient history, and epidemiological information. The expected result is Negative.  Fact Sheet for Patients:  BloggerCourse.com  Fact Sheet for Healthcare Providers:  SeriousBroker.it  This test is no t yet approved or cleared by the [place] FDA and  has been authorized for detection and/or diagnosis of SARS-CoV-2 by FDA under an Emergency Use Authorization (EUA). This EUA will remain  in effect (meaning this test can be used)  for the duration of the COVID-19 declaration under Section 564(b)(1) of the Act, 21 U.S.C.section 360bbb-3(b)(1), unless the authorization is terminated  or revoked sooner.       Influenza A by PCR NEGATIVE NEGATIVE Final   Influenza B by PCR NEGATIVE NEGATIVE Final    Comment: (NOTE) The Xpert Xpress SARS-CoV-2/FLU/RSV plus assay is intended as an aid in the diagnosis of influenza from Nasopharyngeal swab specimens and should not be used as a sole basis for treatment. Nasal washings and aspirates are unacceptable for Xpert Xpress SARS-CoV-2/FLU/RSV testing.  Fact Sheet for Patients: BloggerCourse.com  Fact Sheet for Healthcare Providers: SeriousBroker.it  This test is not yet approved or cleared by the [place] and  has been authorized for detection and/or diagnosis of SARS-CoV-2 by FDA under an Emergency Use Authorization (EUA). This EUA will remain in effect (meaning this test can be used) for the duration of the COVID-19 declaration under Section 564(b)(1) of the Act, 21 U.S.C. section 360bbb-3(b)(1), unless the authorization is terminated or revoked.  Performed at [facility], [address], [place], [name] [address]   Resp Panel by RT-PCR (Flu A&B, Covid) Nasopharyngeal Swab     Status: None   Collection Time: 11/13/20 11:50 AM   Specimen: Nasopharyngeal Swab; Nasopharyngeal(NP) swabs in vial transport medium  Result Value Ref Range Status   SARS Coronavirus 2 by RT PCR NEGATIVE NEGATIVE Final    Comment: (NOTE) SARS-CoV-2 target nucleic acids are NOT DETECTED.  The SARS-CoV-2 RNA is generally detectable in upper respiratory specimens during the acute phase of infection. The lowest concentration of SARS-CoV-2 viral copies this assay can detect is 138 copies/mL. A negative result does not preclude SARS-Cov-2 infection and should not be used as the sole basis for treatment or other patient management  decisions. A negative result may occur with  improper specimen collection/handling, submission of specimen other than nasopharyngeal swab, presence of viral mutation(s) within the areas targeted by this assay, and inadequate number of viral copies(<138 copies/mL). A negative result must be combined with clinical observations, patient history, and epidemiological information. The expected result is Negative.  Fact Sheet for Patients:  BloggerCourse.com  Fact Sheet for Healthcare Providers:  SeriousBroker.it  This test is no t yet approved or cleared by the [place] FDA and  has been authorized for detection and/or diagnosis of SARS-CoV-2 by FDA under an Emergency Use Authorization (EUA). This EUA will remain  in effect (meaning this test can be used) for the duration of the COVID-19 declaration under Section 564(b)(1) of the Act, 21 U.S.C.section 360bbb-3(b)(1), unless the authorization is terminated  or revoked sooner.       Influenza A by PCR NEGATIVE NEGATIVE Final   Influenza B by PCR NEGATIVE NEGATIVE Final    Comment: (NOTE) The Xpert Xpress SARS-CoV-2/FLU/RSV plus assay is intended as an aid in the diagnosis of influenza from Nasopharyngeal swab specimens and should not be used as a sole basis for treatment. Nasal washings and aspirates are unacceptable for Xpert Xpress SARS-CoV-2/FLU/RSV testing.  Fact Sheet for Patients: BloggerCourse.com  Fact Sheet for Healthcare Providers: SeriousBroker.it  This test is not yet approved or cleared by the [place] FDA and has been authorized for detection and/or diagnosis of SARS-CoV-2 by FDA under an Emergency Use Authorization (EUA). This EUA will remain in effect (meaning this test can be used) for the duration of the COVID-19 declaration under Section 564(b)(1) of the Act, 21 U.S.C. section 360bbb-3(b)(1), unless the  authorization is terminated or revoked.  Performed at [facility], [address] [place], [name] [address]      Discharge Instructions:   Discharge Instructions    Call MD for:  temperature >100.4   Complete by: As directed    Diet - low sodium heart healthy   Complete by: As directed    Discharge instructions   Complete by: As directed    Follow up with your primary care physician in one week.   Increase activity slowly   Complete by: As directed    No wound care   Complete by: As directed      Allergies as of 11/16/2020      Reactions   Ibuprofen Anaphylaxis  Acetaminophen Hives, Nausea And Vomiting, Rash   Pt overdosed on bottle of APAP 05/06/16      Medication List    STOP taking these medications   amoxicillin 500 MG capsule Commonly known as: AMOXIL   doxycycline 100 MG tablet Commonly known as: VIBRA-TABS   losartan 50 MG tablet Commonly known as: COZAAR   torsemide 20 MG tablet Commonly known as: DEMADEX     TAKE these medications   albuterol 108 (90 Base) MCG/ACT inhaler Commonly known as: VENTOLIN HFA Inhale 2 puffs into the lungs every 6 (six) hours as needed for wheezing or shortness of breath.   atorvastatin 20 MG tablet Commonly known as: LIPITOR Take 1 tablet (20 mg total) by mouth daily.   carvedilol 3.125 MG tablet Commonly known as: COREG Take 1 tablet (3.125 mg total) by mouth 2 (two) times daily with a meal.   cloNIDine 0.1 MG tablet Commonly known as: CATAPRES Take 1 tablet (0.1 mg total) by mouth 3 (three) times daily.   clopidogrel 75 MG tablet Commonly known as: PLAVIX Take 1 tablet (75 mg total) by mouth daily.   divalproex 500 MG 24 hr tablet Commonly known as: DEPAKOTE ER Take 1 tablet (500 mg total) by mouth in the morning and at bedtime.   furosemide 40 MG tablet Commonly known as: LASIX Take 1 tablet (40 mg total) by mouth daily.   gabapentin 100 MG capsule Commonly known as:  NEURONTIN Take 2 capsules (200 mg total) by mouth 2 (two) times daily.   hydrALAZINE 50 MG tablet Commonly known as: APRESOLINE Take 1 tablet (50 mg total) by mouth every 8 (eight) hours.   Incruse Ellipta 62.5 MCG/INH Aepb Generic drug: umeclidinium bromide Inhale 1 puff into the lungs daily. What changed: when to take this   isosorbide mononitrate 30 MG 24 hr tablet Commonly known as: IMDUR Take 1 tablet (30 mg total) by mouth daily.   lisinopril 20 MG tablet Commonly known as: ZESTRIL Take 1 tablet (20 mg total) by mouth in the morning and at bedtime.   neomycin-bacitracin-polymyxin ointment Commonly known as: NEOSPORIN Apply 1 application topically 2 (two) times daily.   senna-docusate 8.6-50 MG tablet Commonly known as: Senokot-S Take 1 tablet by mouth at bedtime as needed for mild constipation.   spironolactone 25 MG tablet Commonly known as: ALDACTONE Take 0.5 tablets (12.5 mg total) by mouth daily.   traZODone 50 MG tablet Commonly known as: DESYREL Take 1 tablet (50 mg total) by mouth at bedtime.        Time coordinating discharge: 39 minutes  Signed:  Emanuela Runnion  [facility] 11/16/2020, 10:34 AM

## 2020-11-16 NOTE — Progress Notes (Addendum)
@   1740 Patient is scheduled for discharge however, very impulsive and refused to wait for discharge papers. Pt. Requested for bus pass and was informed to wait and will have it sorted out. Refused to wait and went down despite explanation of discharge process. Patient came back stated he just want to smoke.  Patient discharged finally @ 1009hr via wheelchair, accompanied by NT, EVS instructions and education given, refused AM p.o. meds wants only his pain meds. All patient belongings are with the patient Bus pass provided by [name].

## 2020-11-26 ENCOUNTER — Emergency Department (HOSPITAL_COMMUNITY)
Admission: EM | Admit: 2020-11-26 | Discharge: 2020-11-27 | Disposition: A | Discharge status: Home / Self Care | Payer: Medicaid - Out of State | Attending: Emergency Medicine | Admitting: Emergency Medicine

## 2020-11-26 ENCOUNTER — Encounter (HOSPITAL_COMMUNITY): Payer: Self-pay

## 2020-11-26 DIAGNOSIS — R0602 Shortness of breath: Secondary | ICD-10-CM | POA: Insufficient documentation

## 2020-11-26 DIAGNOSIS — F17210 Nicotine dependence, cigarettes, uncomplicated: Secondary | ICD-10-CM | POA: Diagnosis not present

## 2020-11-26 DIAGNOSIS — Z20822 Contact with and (suspected) exposure to covid-19: Secondary | ICD-10-CM | POA: Insufficient documentation

## 2020-11-26 DIAGNOSIS — I5043 Acute on chronic combined systolic (congestive) and diastolic (congestive) heart failure: Secondary | ICD-10-CM | POA: Insufficient documentation

## 2020-11-26 DIAGNOSIS — M79672 Pain in left foot: Secondary | ICD-10-CM | POA: Diagnosis not present

## 2020-11-26 DIAGNOSIS — J449 Chronic obstructive pulmonary disease, unspecified: Secondary | ICD-10-CM | POA: Diagnosis not present

## 2020-11-26 DIAGNOSIS — J45909 Unspecified asthma, uncomplicated: Secondary | ICD-10-CM | POA: Insufficient documentation

## 2020-11-26 DIAGNOSIS — R059 Cough, unspecified: Secondary | ICD-10-CM | POA: Diagnosis present

## 2020-11-26 DIAGNOSIS — E119 Type 2 diabetes mellitus without complications: Secondary | ICD-10-CM | POA: Insufficient documentation

## 2020-11-26 DIAGNOSIS — I110 Hypertensive heart disease with heart failure: Secondary | ICD-10-CM | POA: Diagnosis not present

## 2020-11-26 DIAGNOSIS — M79671 Pain in right foot: Secondary | ICD-10-CM | POA: Diagnosis not present

## 2020-11-26 NOTE — ED Triage Notes (Signed)
Pt presents with c/o cough for a couple of days. Pt reports some shortness of breath with exertion, hx of CHF. Pt also c/o right foot pain, has a possible bunion to the right big toe.

## 2020-11-26 NOTE — ED Notes (Signed)
Pt refuse vitals recheck.

## 2020-11-27 ENCOUNTER — Emergency Department (HOSPITAL_COMMUNITY): Payer: Medicaid - Out of State

## 2020-11-27 ENCOUNTER — Other Ambulatory Visit: Payer: Self-pay

## 2020-11-27 ENCOUNTER — Emergency Department (HOSPITAL_COMMUNITY)
Admission: EM | Admit: 2020-11-27 | Discharge: 2020-11-27 | Discharge status: Absent without leave | Payer: Medicaid - Out of State

## 2020-11-27 LAB — CBC WITH DIFFERENTIAL/PLATELET
Abs Immature Granulocytes: 0.02 10*3/uL (ref 0.00–0.07)
Basophils Absolute: 0 10*3/uL (ref 0.0–0.1)
Basophils Relative: 0 %
Eosinophils Absolute: 0.1 10*3/uL (ref 0.0–0.5)
Eosinophils Relative: 1 %
HCT: 42.5 % (ref 39.0–52.0)
Hemoglobin: 13.9 g/dL (ref 13.0–17.0)
Immature Granulocytes: 0 %
Lymphocytes Relative: 30 %
Lymphs Abs: 2.2 10*3/uL (ref 0.7–4.0)
MCH: 32.3 pg (ref 26.0–34.0)
MCHC: 32.7 g/dL (ref 30.0–36.0)
MCV: 98.8 fL (ref 80.0–100.0)
Monocytes Absolute: 0.9 10*3/uL (ref 0.1–1.0)
Monocytes Relative: 12 %
Neutro Abs: 4.1 10*3/uL (ref 1.7–7.7)
Neutrophils Relative %: 57 %
Platelets: 304 10*3/uL (ref 150–400)
RBC: 4.3 MIL/uL (ref 4.22–5.81)
RDW: 15.7 % — ABNORMAL HIGH (ref 11.5–15.5)
WBC: 7.3 10*3/uL (ref 4.0–10.5)
nRBC: 0 % (ref 0.0–0.2)

## 2020-11-27 LAB — COMPREHENSIVE METABOLIC PANEL
ALT: 35 U/L (ref 0–44)
AST: 26 U/L (ref 15–41)
Albumin: 3.1 g/dL — ABNORMAL LOW (ref 3.5–5.0)
Alkaline Phosphatase: 79 U/L (ref 38–126)
Anion gap: 12 (ref 5–15)
BUN: 13 mg/dL (ref 6–20)
CO2: 22 mmol/L (ref 22–32)
Calcium: 8.5 mg/dL — ABNORMAL LOW (ref 8.9–10.3)
Chloride: 107 mmol/L (ref 98–111)
Creatinine, Ser: 1.28 mg/dL — ABNORMAL HIGH (ref 0.61–1.24)
GFR, Estimated: >60 mL/min (ref >60)
Glucose, Bld: 85 mg/dL (ref 70–99)
Potassium: 4 mmol/L (ref 3.5–5.1)
Sodium: 141 mmol/L (ref 135–145)
Total Bilirubin: 0.9 mg/dL (ref 0.3–1.2)
Total Protein: 6.2 g/dL — ABNORMAL LOW (ref 6.5–8.1)

## 2020-11-27 LAB — TROPONIN I (HIGH SENSITIVITY): Troponin I (High Sensitivity): 58 ng/L — ABNORMAL HIGH (ref <18)

## 2020-11-27 LAB — RESP PANEL BY RT-PCR (FLU A&B, COVID) ARPGX2
Influenza A by PCR: NEGATIVE
Influenza B by PCR: NEGATIVE
SARS Coronavirus 2 by RT PCR: NEGATIVE

## 2020-11-27 LAB — BRAIN NATRIURETIC PEPTIDE: B Natriuretic Peptide: 992.7 pg/mL — ABNORMAL HIGH (ref 0.0–100.0)

## 2020-11-27 LAB — VALPROIC ACID LEVEL: Valproic Acid Lvl: <10 ug/mL — ABNORMAL LOW (ref 50.0–100.0)

## 2020-11-27 IMAGING — DX DG FOOT COMPLETE 3+V*R*
3 series · 3 of 3 positions shown · non-contrast
Comparison: [DATE]

CLINICAL DATA: Foot wound

EXAM:
RIGHT FOOT COMPLETE - 3+ VIEW

[foot ap]
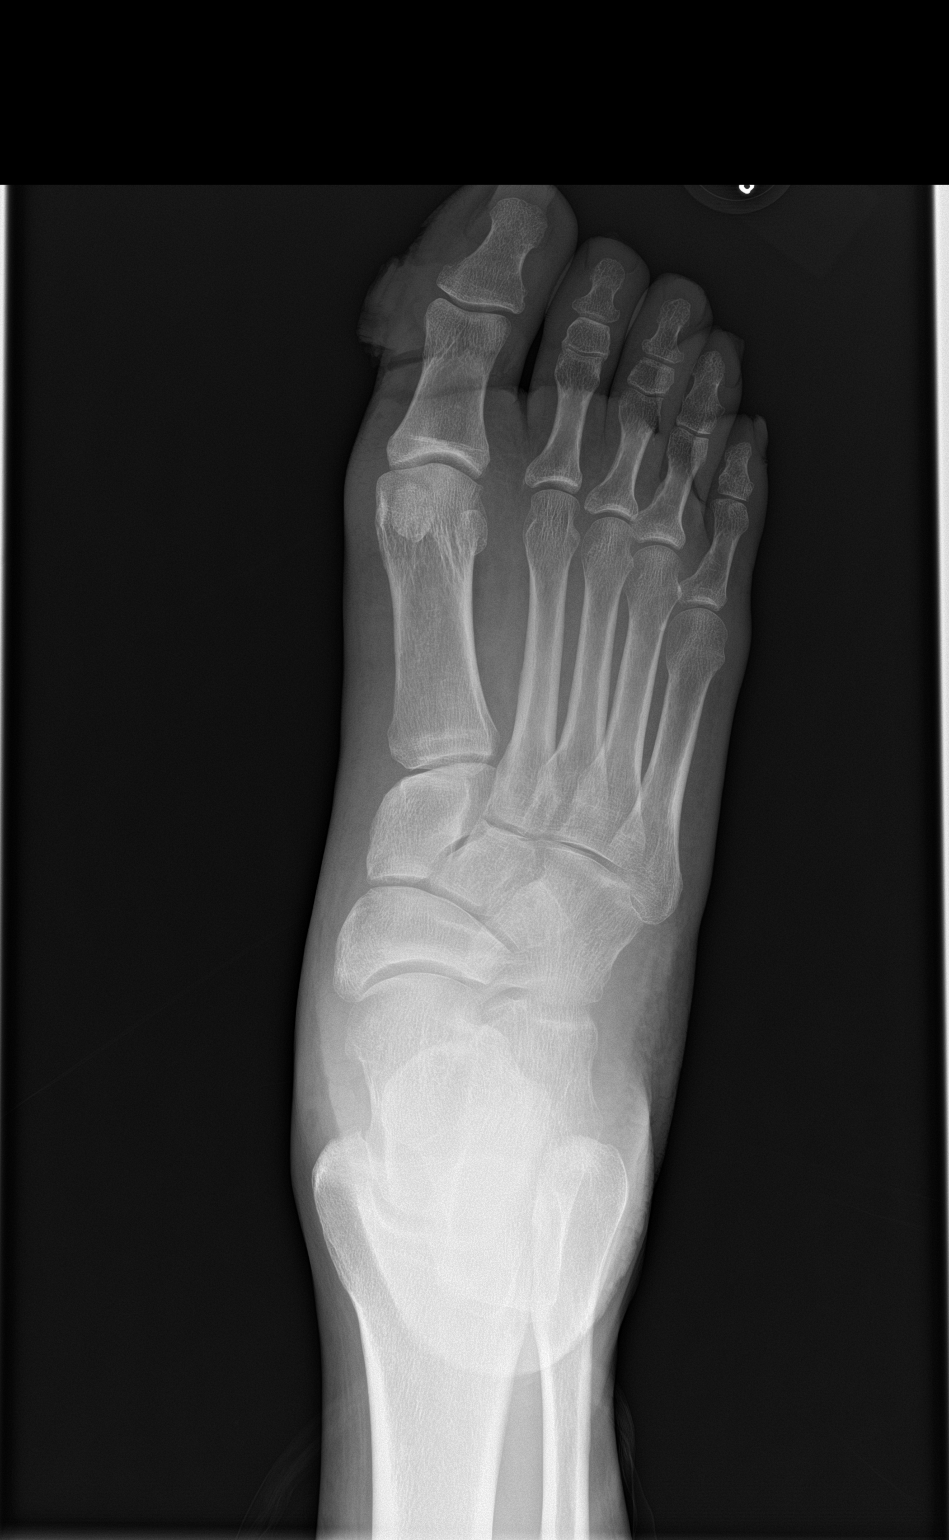

[foot obl]
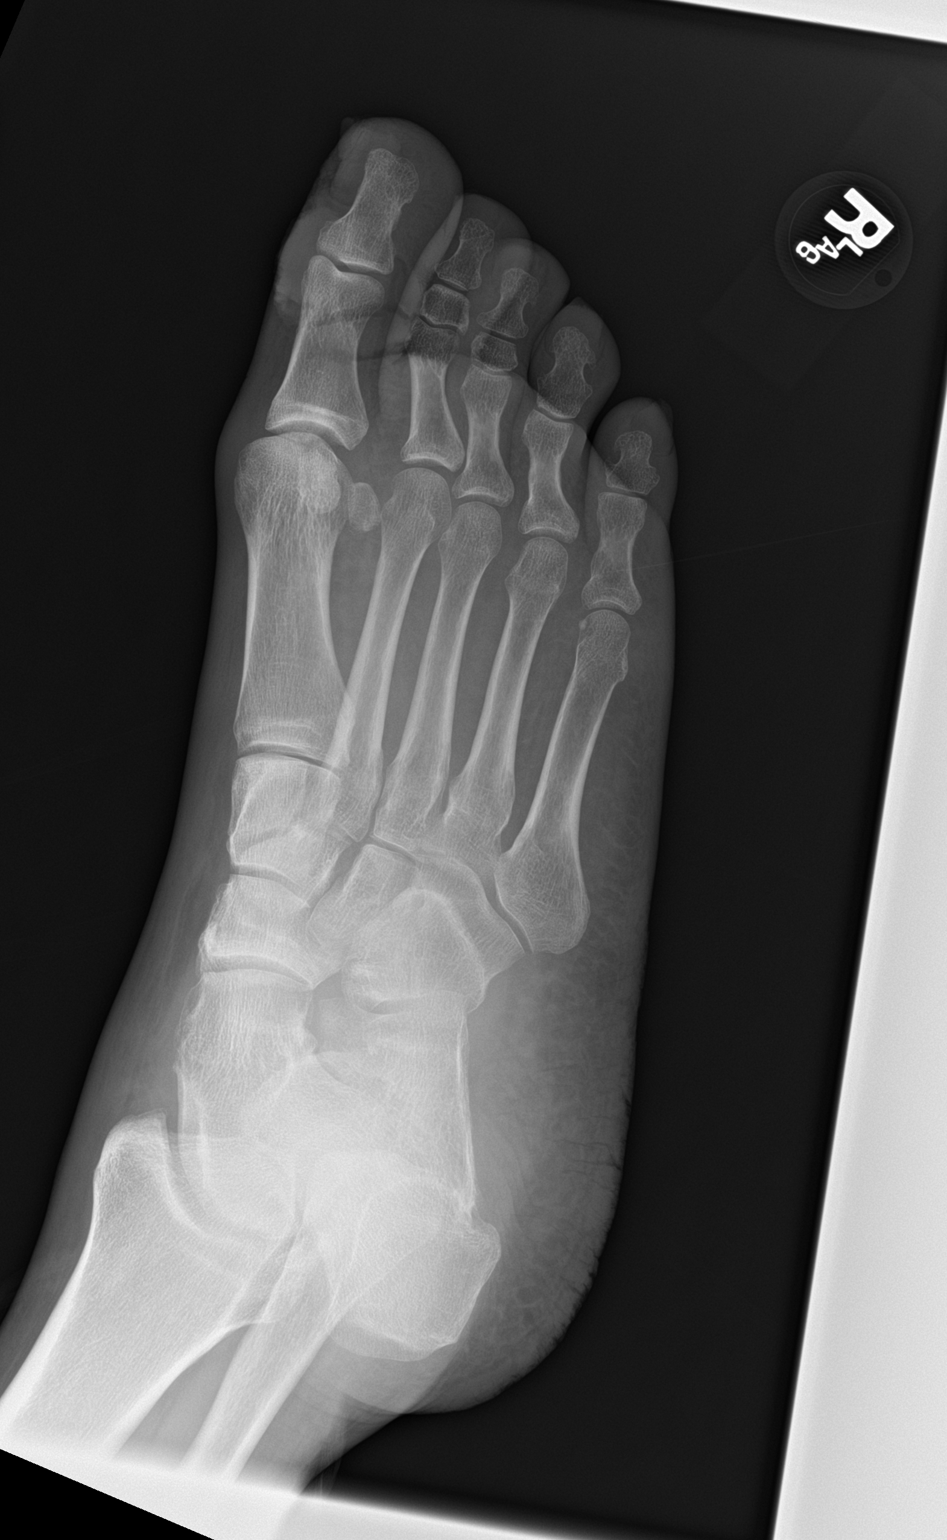

[foot lat]
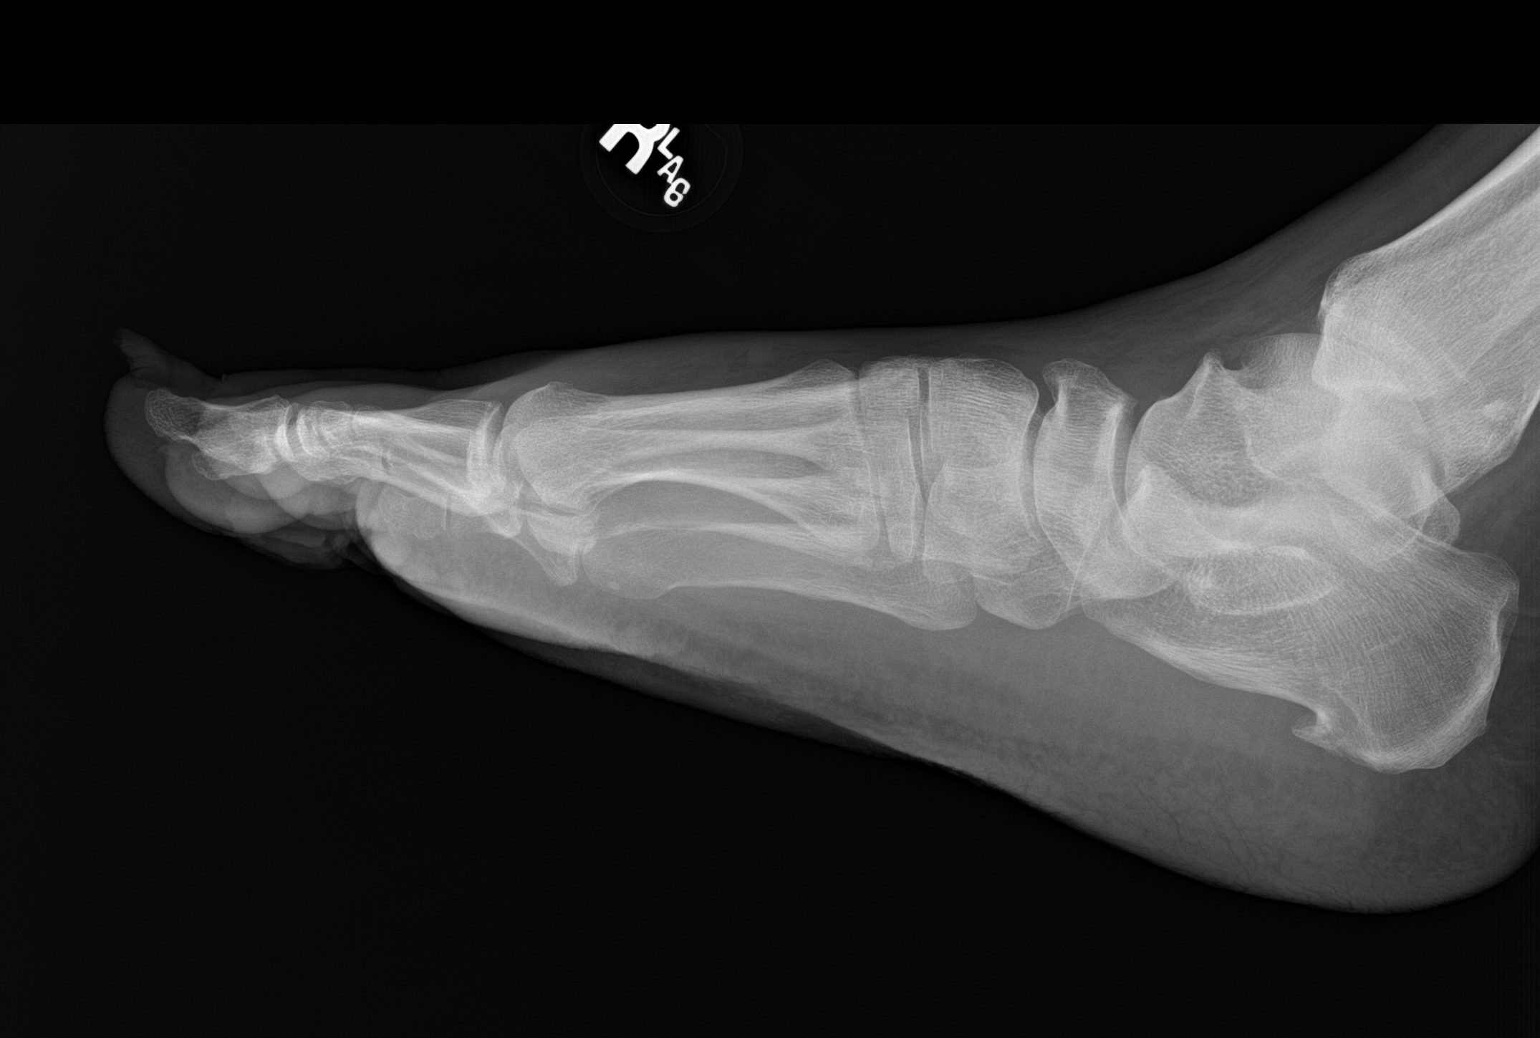

[3 of 3 positions shown; findings below may reference images not displayed]

FINDINGS: Soft tissue wound along the medial aspect of the right great toe. No
osteolysis or other acute abnormality of the bones.
IMPRESSION: Soft tissue wound along the medial aspect of the right great toe
without radiographic evidence of osteomyelitis.

## 2020-11-27 IMAGING — DX DG CHEST 1V PORT
1 series · 1 of 1 positions shown · non-contrast
Comparison: [DATE]

CLINICAL DATA: Shortness of breath and cough

EXAM:
PORTABLE CHEST 1 VIEW

[chest ap]
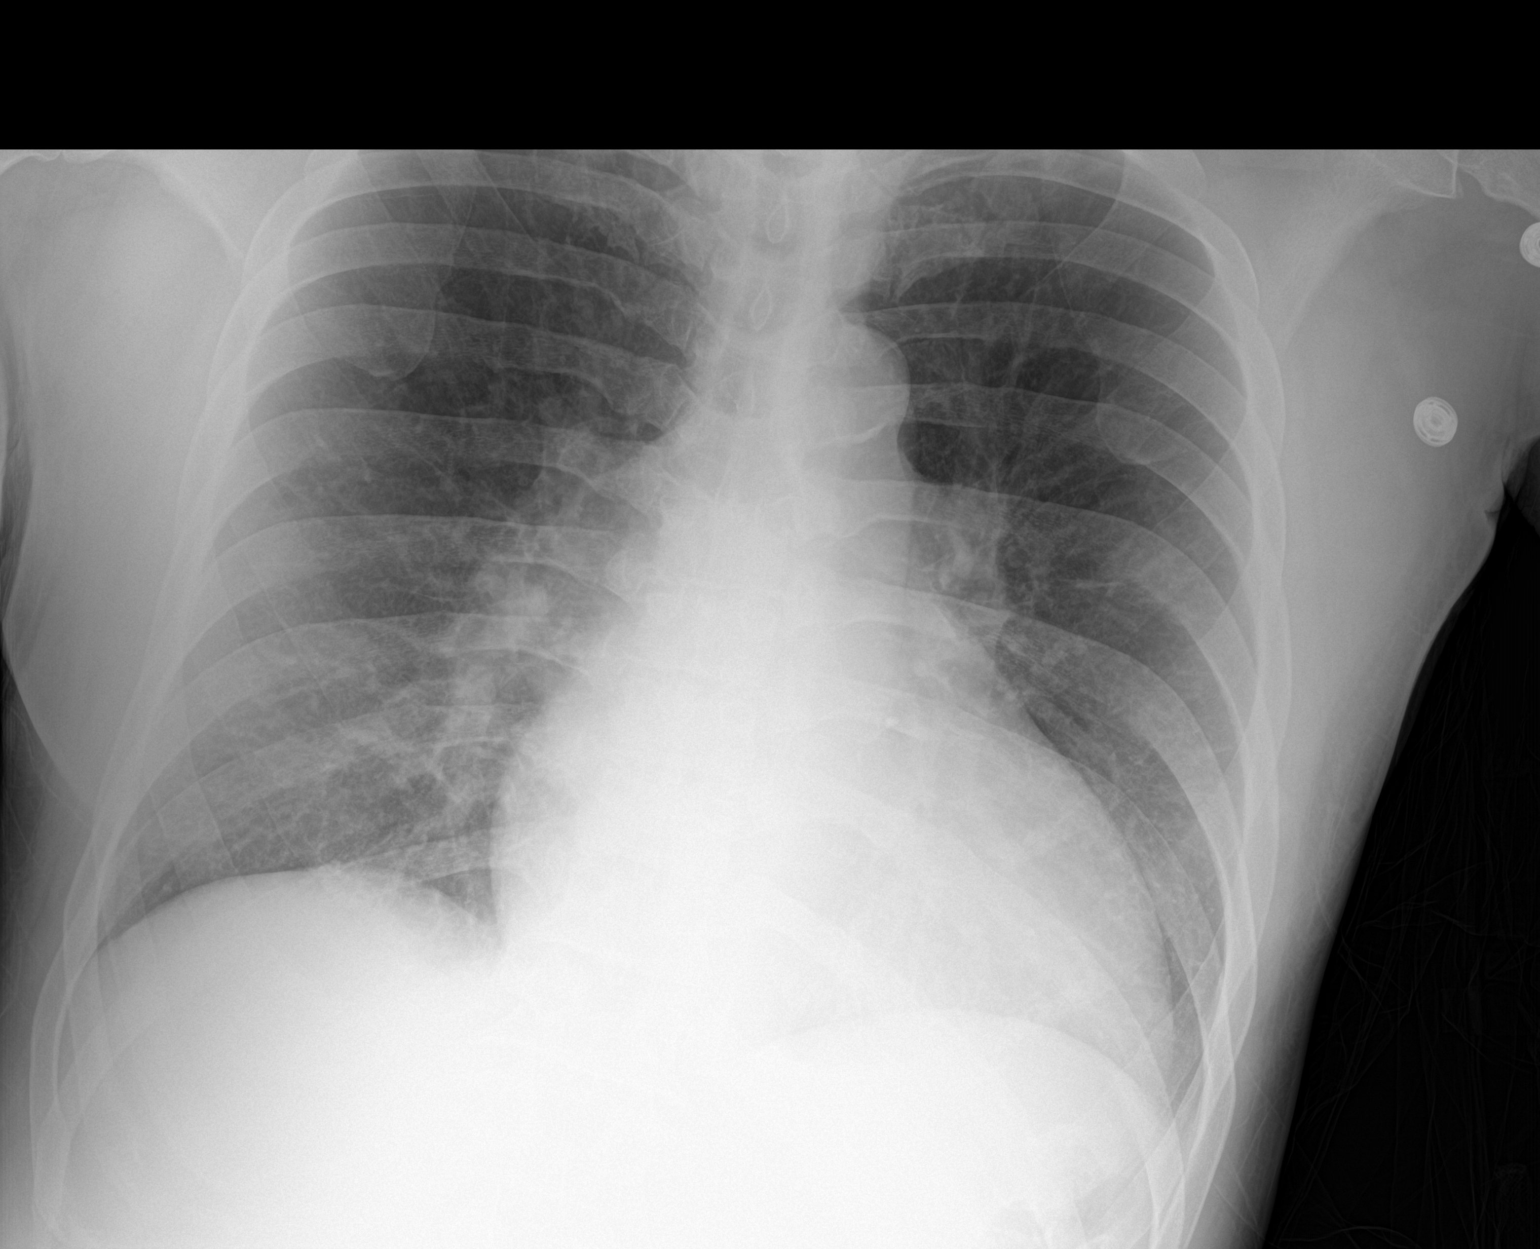

[1 of 1 positions shown; findings below may reference images not displayed]

FINDINGS: Unchanged cardiomegaly. No focal airspace consolidation or pulmonary
edema. No pleural effusion or pneumothorax.
IMPRESSION: Cardiomegaly without focal airspace disease.

## 2020-11-27 IMAGING — DX DG FOOT COMPLETE 3+V*L*
3 series · 3 of 3 positions shown · non-contrast
Comparison: None.

CLINICAL DATA: Foot pain and possible infection

EXAM:
LEFT FOOT - COMPLETE 3+ VIEW

[foot ap]
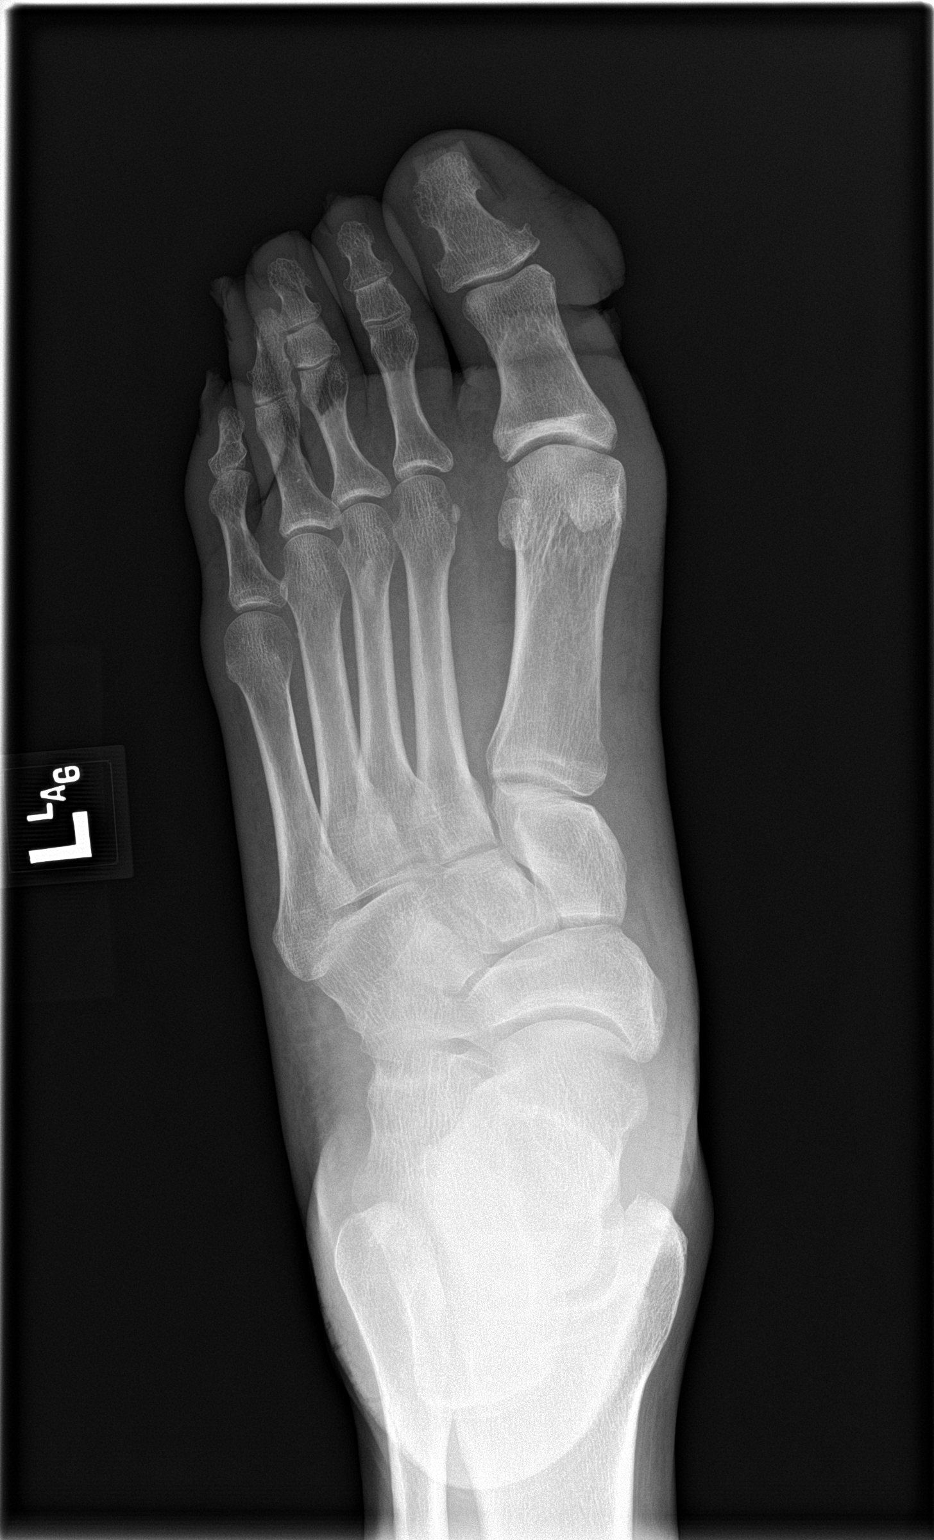

[foot obl]
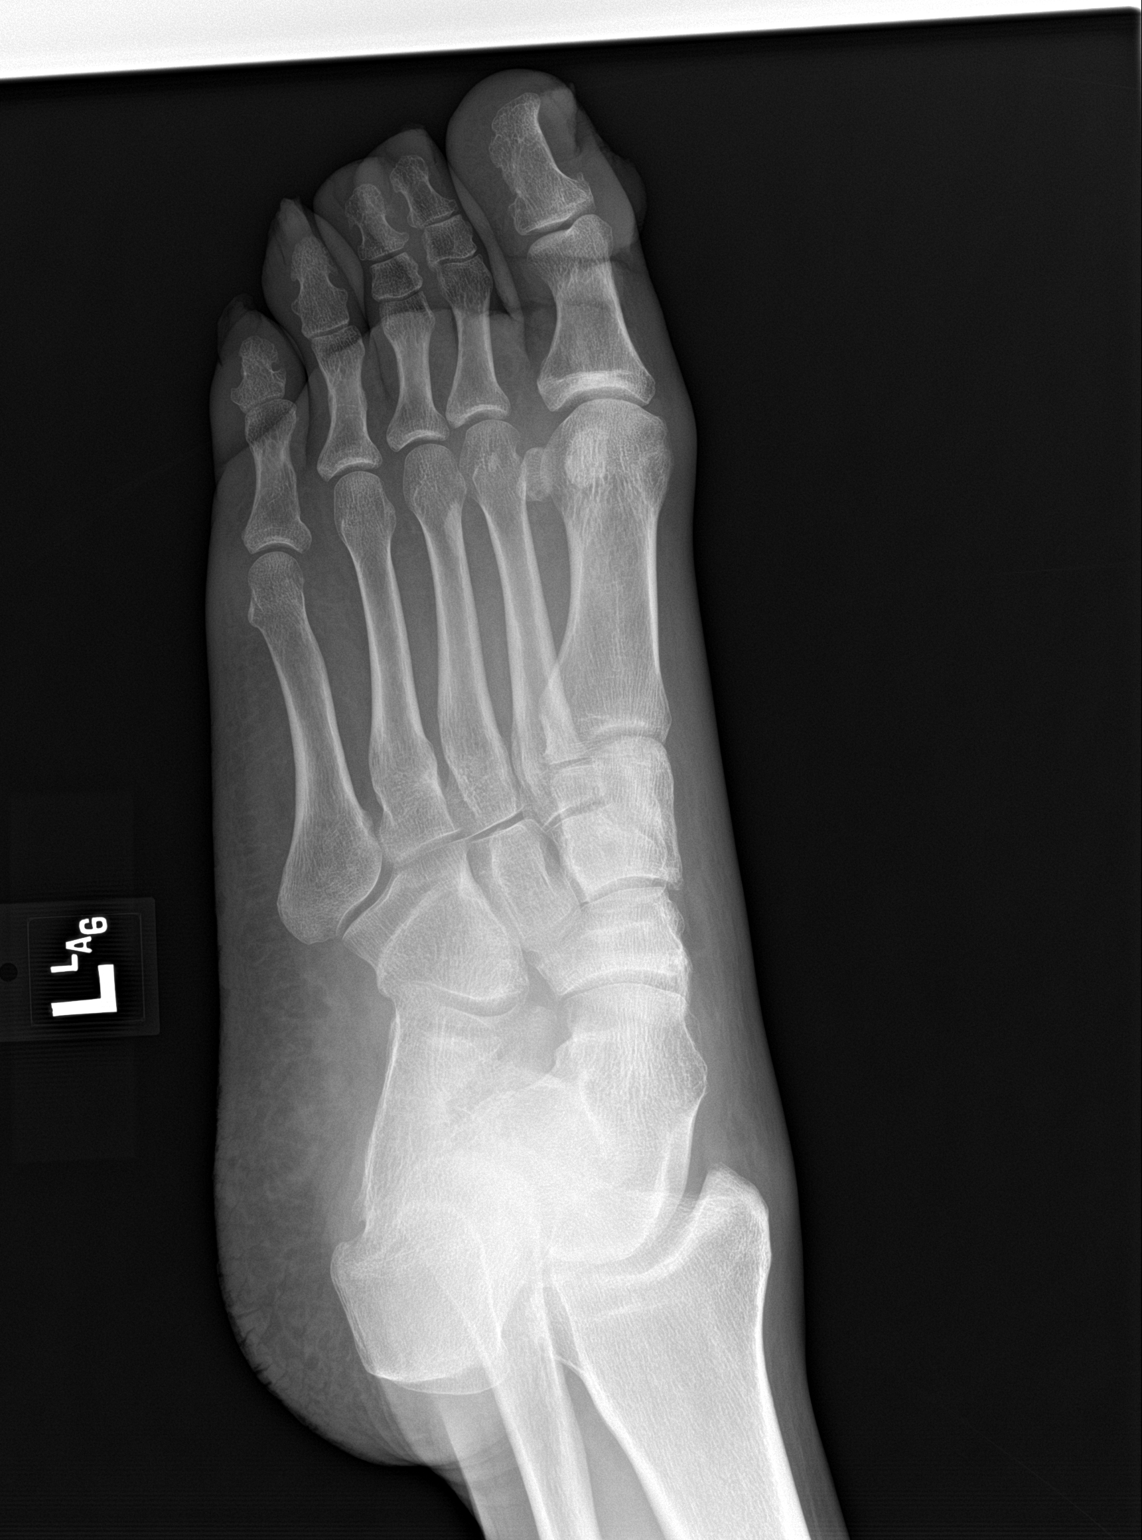

[foot lat]
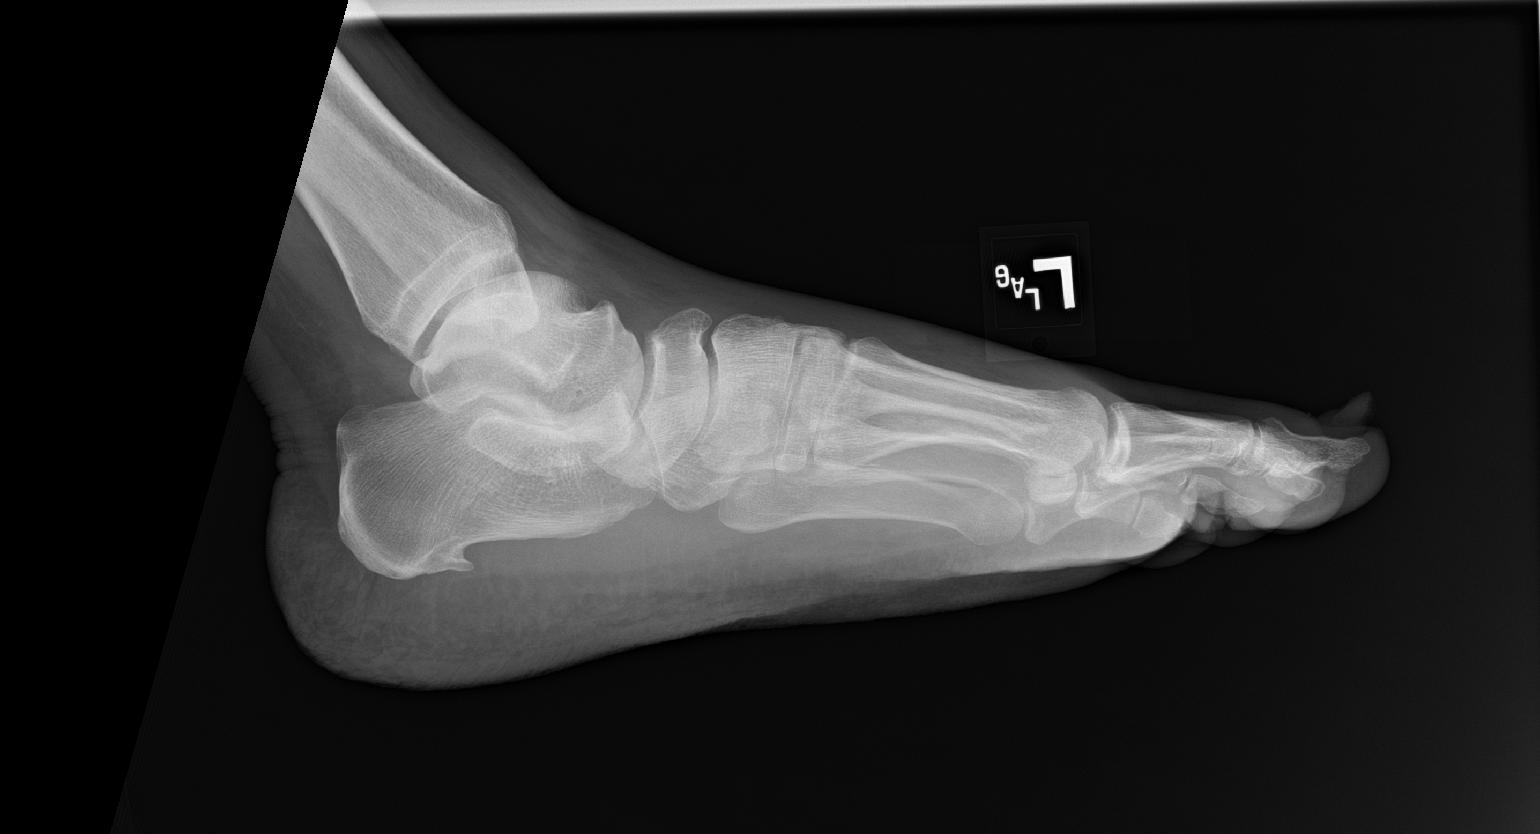

[3 of 3 positions shown; findings below may reference images not displayed]

FINDINGS: There is no evidence of fracture or dislocation. There is no
evidence of arthropathy or other focal bone abnormality. Soft
tissues are unremarkable.
IMPRESSION: No evidence of active infectious osteitis.

## 2020-11-27 MED ORDER — ALBUTEROL SULFATE HFA 108 (90 BASE) MCG/ACT IN AERS
2.0000 | INHALATION_SPRAY | Freq: Once | RESPIRATORY_TRACT | Status: DC
  Filled 2020-11-27: qty 6.7

## 2020-11-27 MED ORDER — FENTANYL CITRATE (PF) 100 MCG/2ML IJ SOLN
50.0000 ug | Freq: Once | INTRAMUSCULAR | Status: AC
  Administered 2020-11-27: 50 ug via INTRAVENOUS
  Filled 2020-11-27: qty 2

## 2020-11-27 MED ORDER — GABAPENTIN 100 MG PO CAPS
200.0000 mg | ORAL_CAPSULE | Freq: Two times a day (BID) | ORAL | Status: DC
  Administered 2020-11-27: 200 mg via ORAL
  Filled 2020-11-27: qty 2

## 2020-11-27 MED ORDER — FUROSEMIDE 10 MG/ML IJ SOLN
40.0000 mg | Freq: Once | INTRAMUSCULAR | Status: AC
  Administered 2020-11-27: 40 mg via INTRAVENOUS
  Filled 2020-11-27: qty 4

## 2020-11-27 MED ORDER — IPRATROPIUM-ALBUTEROL 0.5-2.5 (3) MG/3ML IN SOLN
3.0000 mL | Freq: Once | RESPIRATORY_TRACT | Status: AC
  Administered 2020-11-27: 3 mL via RESPIRATORY_TRACT
  Filled 2020-11-27: qty 3

## 2020-11-27 NOTE — ED Notes (Signed)
Pt refuses continuous cardiac monitoring

## 2020-11-27 NOTE — Discharge Instructions (Signed)
You were seen in the emergency department today with trouble breathing and foot pain.  Please continue your home medications.  We gave you extra dose of Lasix which should help your symptoms.  Please establish care with a primary care doctor listed by calling today.  Return to the emergency department any new or suddenly worsening symptoms.

## 2020-11-27 NOTE — ED Notes (Signed)
RT called to administer neb tx.

## 2020-11-27 NOTE — ED Provider Notes (Signed)
Emergency Department Provider Note   I have reviewed the triage vital signs and the nursing notes.   HISTORY  Chief Complaint Cough and Foot Pain   [name] is a 54 y.o. male with PMH reviewed below presents to the ED with SOB, cough, and foot pain worsening over the last 3-4 days. He reports being compliant with meds but has history of non-compliance in the past. Denies fever. Has had recent admit in mid-December for CHF exacerbation. Noted to have fissuring and calluses to the bilateral feet at that admission but MRI showed no evidence of osteomyelitis.  He states that he has had some drainage from these areas and increased pain.  He has had worsening shortness of breath and cough especially over the past several days despite being compliant with his diuretics.  He states he feels like the fluid is not coming off the way it should and so presents to the ED for evaluation.  He is unstable he housed and spends part of his time on the street and part of his time with a friend in their house but nothing consistent. No CP.    Past Medical History:  Diagnosis Date  . Asthma   . Bipolar 1 disorder (HCC)   . CHF (congestive heart failure) (HCC)   . COPD (chronic obstructive pulmonary disease) (HCC)   . Depression   . Diabetes mellitus without complication (HCC)   . GERD (gastroesophageal reflux disease)   . Homelessness   . Hypertension   . QT prolongation   . Schizoaffective disorder [facility])     Patient Active Problem List   Diagnosis Date Noted  . CHF (congestive heart failure) (HCC) 11/14/2020  . CHF exacerbation (HCC) 11/13/2020  . Hypokalemia   . Hyperlipidemia   . Acute respiratory failure with hypoxia (HCC) 10/26/2020  . COPD with acute exacerbation (HCC) 10/14/2020  . Acute on chronic combined systolic and diastolic CHF (congestive heart failure) (HCC) 10/02/2020  . Near syncope 07/21/2020  . Schizoaffective disorder (HCC) 06/24/2019  . SOB (shortness of  breath)   . QT prolongation 05/25/2019  . Cocaine abuse (HCC)   . Elevated troponin   . Homelessness   . Suicidal ideation   . Bilateral foot pain 04/05/2019  . Housing situation unstable 03/28/2019  . Bilateral lower extremity edema   . Asthma 12/26/2016  . Tobacco use disorder 12/26/2016  . Schizophrenia (HCC) 12/26/2016  . Cocaine abuse with cocaine-induced mood disorder (HCC) 09/06/2016  . Alcohol use disorder, mild, abuse 07/31/2016  . Substance induced mood disorder (HCC) 07/30/2016  . Schizoaffective disorder, bipolar type (HCC) 05/31/2016  . Cocaine use disorder, severe, dependence (HCC) 05/31/2016  . Essential hypertension 05/31/2016    History reviewed. No pertinent surgical history.  Allergies Ibuprofen and Acetaminophen  Family History  Problem Relation Age of Onset  . Drug abuse Mother   . Suicidality Mother     Social History Social History   Tobacco Use  . Smoking status: Current Every Day Smoker    Years: 30.00    Types: Cigarettes  . Smokeless tobacco: Never Used  Vaping Use  . Vaping Use: Never used  Substance Use Topics  . Alcohol use: Yes    Comment: infrequently  . Drug use: Yes    Types: Cocaine, Marijuana    Review of Systems  Constitutional: No fever/chills Eyes: No visual changes. ENT: No sore throat. Cardiovascular: Denies chest pain. Respiratory: Positive shortness of breath. Positive cough.   Gastrointestinal: No abdominal pain.  No nausea, no vomiting. No diarrhea. No constipation. Genitourinary: Negative for dysuria. Musculoskeletal: Negative for back pain. Bilateral foot pain.  Skin: Negative for rash. Neurological: Negative for headaches, focal weakness or numbness.  10-point ROS otherwise negative.  ____________________________________________   PHYSICAL EXAM:  VITAL SIGNS: ED Triage Vitals  Enc Vitals Group     BP 11/26/20 1728 129/81     Pulse Rate 11/26/20 1728 99     Resp 11/26/20 1728 18     Temp 11/26/20  1728 98 F (36.7 C)     Temp Source 11/26/20 1728 Oral     SpO2 11/26/20 1728 100 %   Constitutional: Alert and oriented. Well appearing and in no acute distress. Eyes: Conjunctivae are normal. Head: Atraumatic. Nose: No congestion/rhinnorhea. Mouth/Throat: Mucous membranes are moist.  Oropharynx non-erythematous. Neck: No stridor.  Cardiovascular: Normal rate, regular rhythm. Good peripheral circulation. Grossly normal heart sounds. Intact DP/PT pulses bilaterally.  Respiratory: Increased respiratory effort with exertion mainly (repositioning in bed).  No retractions. Lungs are tight with poor air entry and end-expiratory wheezing.  Gastrointestinal: Soft and nontender. No distention.  Musculoskeletal: No lower extremity tenderness nor edema. No gross deformities of extremities. Neurologic:  Normal speech and language. No gross focal neurologic deficits are appreciated.  Skin:  Skin is warm and dry.  There are calluses to the medial great toes bilaterally with some fissuring more proximally.  No cellulitis or biofilm/purulent drainage from the fissuring.   ____________________________________________   LABS (all labs ordered are listed, but only abnormal results are displayed)  Labs Reviewed  COMPREHENSIVE METABOLIC PANEL - Abnormal; Notable for the following components:      Result Value   Creatinine, Ser 1.28 (*)    Calcium 8.5 (*)    Total Protein 6.2 (*)    Albumin 3.1 (*)    All other components within normal limits  BRAIN NATRIURETIC PEPTIDE - Abnormal; Notable for the following components:   B Natriuretic Peptide 992.7 (*)    All other components within normal limits  CBC WITH DIFFERENTIAL/PLATELET - Abnormal; Notable for the following components:   RDW 15.7 (*)    All other components within normal limits  VALPROIC ACID LEVEL - Abnormal; Notable for the following components:   Valproic Acid Lvl <10 (*)    All other components within normal limits  TROPONIN I (HIGH  SENSITIVITY) - Abnormal; Notable for the following components:   Troponin I (High Sensitivity) 58 (*)    All other components within normal limits  RESP PANEL BY RT-PCR (FLU A&B, COVID) [id number]   ____________________________________________  EKG   EKG Interpretation  Date/Time:  Tuesday November 27 2020 01:31:30 EST Ventricular Rate:  92 PR Interval:    QRS Duration: 99 QT Interval:  430 QTC Calculation: 532 R Axis:   -34 Text Interpretation: Sinus rhythm Biatrial enlargement LVH with secondary repolarization abnormality Prolonged QT interval No STEMI Confirmed by Alona Bene [phone]) on 11/27/2020 8:28:15 PM       ____________________________________________  RADIOLOGY  DG Chest Portable 1 View  Result Date: 11/27/2020 CLINICAL DATA:  Shortness of breath and cough EXAM: PORTABLE CHEST 1 VIEW COMPARISON:  11/13/2020 FINDINGS: Unchanged cardiomegaly. No focal airspace consolidation or pulmonary edema. No pleural effusion or pneumothorax. IMPRESSION: Cardiomegaly without focal airspace disease. Electronically Signed   By: Deatra Robinson M.D.   On: 11/27/2020 01:05   DG Foot Complete Left  Result Date: 11/27/2020 CLINICAL DATA:  Foot pain and possible infection EXAM: LEFT FOOT - COMPLETE 3+ VIEW  COMPARISON:  None. FINDINGS: There is no evidence of fracture or dislocation. There is no evidence of arthropathy or other focal bone abnormality. Soft tissues are unremarkable. IMPRESSION: No evidence of active infectious osteitis. Electronically Signed   By: Deatra Robinson M.D.   On: 11/27/2020 01:07   DG Foot Complete Right  Result Date: 11/27/2020 CLINICAL DATA:  Foot wound EXAM: RIGHT FOOT COMPLETE - 3+ VIEW COMPARISON:  11/13/2020 FINDINGS: Soft tissue wound along the medial aspect of the right great toe. No osteolysis or other acute abnormality of the bones. IMPRESSION: Soft tissue wound along the medial aspect of the right great toe without radiographic evidence of osteomyelitis.  Electronically Signed   By: Deatra Robinson M.D.   On: 11/27/2020 01:08    ____________________________________________   PROCEDURES  Procedure(s) performed:   Procedures  None ____________________________________________   INITIAL IMPRESSION / ASSESSMENT AND PLAN / ED COURSE  Pertinent labs & imaging results that were available during my care of the patient were reviewed by me and considered in my medical decision making (see chart for details).   Patient presents the emergency department with cough, shortness of breath, foot pain bilaterally.  His feet and exam seem consistent with his recent admission.  He had MRI at that time showing no evidence of osteomyelitis and supportive care was advised.  In terms of his shortness of breath he does have increased work of breathing especially with repositioning in the bed but has not significantly hypoxemic.  His lungs are tight and he has some end expiratory wheezing.  Plan for work-up including BNP, chest x-ray, Covid swab.  We will start with albuterol inhaler pending Covid test and obtain plain films of the bilateral feet but lower suspicion for osteomyelitis clinically.  06:00 AM  Patient's labs and imaging reviewed. He has no evidence of osteomyelitis in either foot. I reviewed the discharge summary from earlier this month where MRIs were performed and continued supportive care advised. Patient's BNP is elevated here but clinically he is not acutely volume overloaded. He does not have pulmonary edema on his chest x-ray and fairly minimal swelling in the legs and feet. He was given IV Lasix here with good urine output feeling almost to bedside urinals here. We will hold his outpatient medications stable for now and have him follow closely with his primary care doctor. Will be discharged home with albuterol inhaler as well. ____________________________________________  FINAL CLINICAL IMPRESSION(S) / ED DIAGNOSES  Final diagnoses:  SOB  (shortness of breath)  Bilateral foot pain     MEDICATIONS GIVEN DURING THIS VISIT:  Medications  fentaNYL (SUBLIMAZE) injection 50 mcg (50 mcg Intravenous Given 11/27/20 0214)  ipratropium-albuterol (DUONEB) 0.5-2.5 (3) MG/3ML nebulizer solution 3 mL (3 mLs Nebulization Given 11/27/20 0337)  furosemide (LASIX) injection 40 mg (40 mg Intravenous Given 11/27/20 0452)    Note:  This document was prepared using Dragon voice recognition software and may include unintentional dictation errors.  Alona Bene, MD, [facility]    Gorje Iyer, Arlyss Repress, MD 11/27/20 2028

## 2020-12-04 ENCOUNTER — Telehealth: Payer: Self-pay | Admitting: *Deleted

## 2020-12-04 NOTE — Telephone Encounter (Signed)
A message was left,re: call our office. 

## 2020-12-05 ENCOUNTER — Encounter (HOSPITAL_COMMUNITY): Payer: Self-pay

## 2020-12-05 ENCOUNTER — Emergency Department (HOSPITAL_COMMUNITY): Payer: Medicaid Other

## 2020-12-05 ENCOUNTER — Emergency Department (HOSPITAL_COMMUNITY)
Admission: EM | Admit: 2020-12-05 | Discharge: 2020-12-06 | Disposition: A | Discharge status: Home / Self Care | Payer: Medicaid Other | Attending: Emergency Medicine | Admitting: Emergency Medicine

## 2020-12-05 DIAGNOSIS — Z7901 Long term (current) use of anticoagulants: Secondary | ICD-10-CM | POA: Insufficient documentation

## 2020-12-05 DIAGNOSIS — F17210 Nicotine dependence, cigarettes, uncomplicated: Secondary | ICD-10-CM | POA: Diagnosis not present

## 2020-12-05 DIAGNOSIS — R0789 Other chest pain: Secondary | ICD-10-CM | POA: Diagnosis not present

## 2020-12-05 DIAGNOSIS — L97521 Non-pressure chronic ulcer of other part of left foot limited to breakdown of skin: Secondary | ICD-10-CM

## 2020-12-05 DIAGNOSIS — J45909 Unspecified asthma, uncomplicated: Secondary | ICD-10-CM | POA: Diagnosis not present

## 2020-12-05 DIAGNOSIS — Z79899 Other long term (current) drug therapy: Secondary | ICD-10-CM | POA: Insufficient documentation

## 2020-12-05 DIAGNOSIS — I5043 Acute on chronic combined systolic (congestive) and diastolic (congestive) heart failure: Secondary | ICD-10-CM | POA: Diagnosis not present

## 2020-12-05 DIAGNOSIS — E119 Type 2 diabetes mellitus without complications: Secondary | ICD-10-CM | POA: Diagnosis not present

## 2020-12-05 DIAGNOSIS — L97401 Non-pressure chronic ulcer of unspecified heel and midfoot limited to breakdown of skin: Secondary | ICD-10-CM | POA: Diagnosis not present

## 2020-12-05 DIAGNOSIS — J441 Chronic obstructive pulmonary disease with (acute) exacerbation: Secondary | ICD-10-CM | POA: Diagnosis not present

## 2020-12-05 DIAGNOSIS — Z7951 Long term (current) use of inhaled steroids: Secondary | ICD-10-CM | POA: Diagnosis not present

## 2020-12-05 DIAGNOSIS — I110 Hypertensive heart disease with heart failure: Secondary | ICD-10-CM | POA: Insufficient documentation

## 2020-12-05 DIAGNOSIS — M79672 Pain in left foot: Secondary | ICD-10-CM | POA: Diagnosis present

## 2020-12-05 DIAGNOSIS — L97511 Non-pressure chronic ulcer of other part of right foot limited to breakdown of skin: Secondary | ICD-10-CM

## 2020-12-05 LAB — CBC WITH DIFFERENTIAL/PLATELET
Abs Immature Granulocytes: 0.02 10*3/uL (ref 0.00–0.07)
Basophils Absolute: 0.1 10*3/uL (ref 0.0–0.1)
Basophils Relative: 1 %
Eosinophils Absolute: 0 10*3/uL (ref 0.0–0.5)
Eosinophils Relative: 0 %
HCT: 42.9 % (ref 39.0–52.0)
Hemoglobin: 14.4 g/dL (ref 13.0–17.0)
Immature Granulocytes: 0 %
Lymphocytes Relative: 38 %
Lymphs Abs: 2.7 10*3/uL (ref 0.7–4.0)
MCH: 33 pg (ref 26.0–34.0)
MCHC: 33.6 g/dL (ref 30.0–36.0)
MCV: 98.2 fL (ref 80.0–100.0)
Monocytes Absolute: 0.9 10*3/uL (ref 0.1–1.0)
Monocytes Relative: 12 %
Neutro Abs: 3.5 10*3/uL (ref 1.7–7.7)
Neutrophils Relative %: 49 %
Platelets: 403 10*3/uL — ABNORMAL HIGH (ref 150–400)
RBC: 4.37 MIL/uL (ref 4.22–5.81)
RDW: 15 % (ref 11.5–15.5)
WBC: 7.2 10*3/uL (ref 4.0–10.5)
nRBC: 0 % (ref 0.0–0.2)

## 2020-12-05 LAB — COMPREHENSIVE METABOLIC PANEL
ALT: 23 U/L (ref 0–44)
AST: 46 U/L — ABNORMAL HIGH (ref 15–41)
Albumin: 3.2 g/dL — ABNORMAL LOW (ref 3.5–5.0)
Alkaline Phosphatase: 74 U/L (ref 38–126)
Anion gap: 14 (ref 5–15)
BUN: 12 mg/dL (ref 6–20)
CO2: 20 mmol/L — ABNORMAL LOW (ref 22–32)
Calcium: 8.7 mg/dL — ABNORMAL LOW (ref 8.9–10.3)
Chloride: 105 mmol/L (ref 98–111)
Creatinine, Ser: 1.35 mg/dL — ABNORMAL HIGH (ref 0.61–1.24)
GFR, Estimated: >60 mL/min (ref >60)
Glucose, Bld: 71 mg/dL (ref 70–99)
Potassium: 4.2 mmol/L (ref 3.5–5.1)
Sodium: 139 mmol/L (ref 135–145)
Total Bilirubin: 1.2 mg/dL (ref 0.3–1.2)
Total Protein: 6.7 g/dL (ref 6.5–8.1)

## 2020-12-05 IMAGING — DX DG CHEST 1V PORT
1 series · 1 of 1 positions shown · non-contrast
Comparison: [DATE]

CLINICAL DATA: Chest pain

EXAM:
PORTABLE CHEST 1 VIEW

[chest ap]
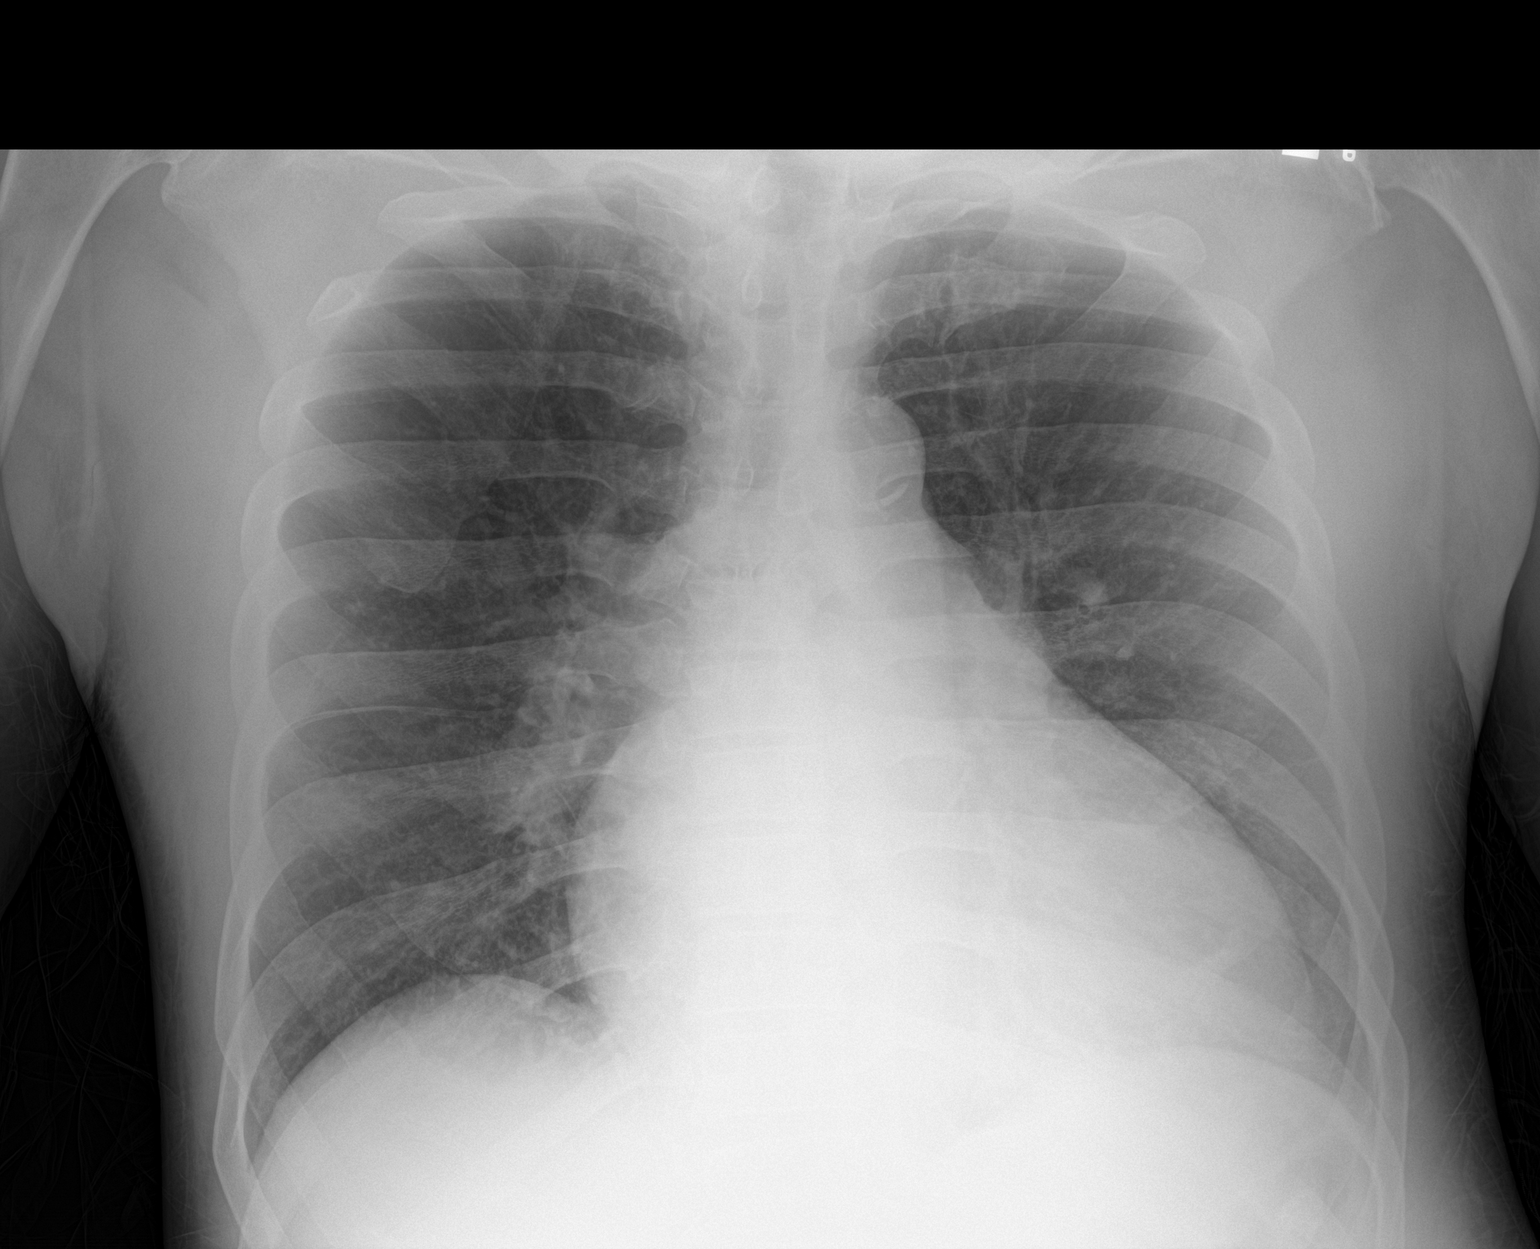

[1 of 1 positions shown; findings below may reference images not displayed]

FINDINGS: Single frontal view of the chest demonstrates stable enlargement of
the cardiac silhouette. No airspace disease, effusion, or
pneumothorax. No acute bony abnormalities.
IMPRESSION: 1. Stable enlarged cardiac silhouette.  No acute process.

## 2020-12-05 MED ORDER — FENTANYL CITRATE (PF) 100 MCG/2ML IJ SOLN: 50.0000 ug | Freq: Once | INTRAMUSCULAR | Status: DC

## 2020-12-05 NOTE — ED Triage Notes (Signed)
Pt arrived via EMS for left sided foot pain, states this has been going on for a while , believes it might be infected.

## 2020-12-06 ENCOUNTER — Emergency Department (HOSPITAL_COMMUNITY): Payer: Medicaid Other

## 2020-12-06 MED ORDER — ONDANSETRON HCL 4 MG/2ML IJ SOLN
INTRAMUSCULAR | Status: AC
  Filled 2020-12-06: qty 2

## 2020-12-06 MED ORDER — KETOROLAC TROMETHAMINE 60 MG/2ML IM SOLN
30.0000 mg | Freq: Once | INTRAMUSCULAR | Status: AC
  Administered 2020-12-06: 30 mg via INTRAMUSCULAR
  Filled 2020-12-06: qty 2

## 2020-12-06 NOTE — ED Notes (Signed)
Wound  On righ  Foot has bene dressed according to [place] standards. Site irrigated well, non adherent pad and kerlex applied in addition ace wrap applied for security. Sock placed over site to ensure site stays clean and dry. Pt tolerated well.

## 2020-12-06 NOTE — ED Notes (Signed)
Initial contact with pt. Pt is yelling and screaming that he wants graham crackers and milk NOW! Pt is refusing to let nurse obtain VS provider made aware.

## 2020-12-06 NOTE — Discharge Instructions (Addendum)
Thank you for allowing me to care for you today in the Emergency Department.   You need to clean the area at least once daily with warm water and soap.  Change the gauze dressings daily.  Please call and schedule a follow-up appointment with the wound clinic.  Continue to take your home medications as prescribed.  Elevate your legs when possible to help with pain and swelling.  You can apply an ice pack or heating pad for 15 to 20 minutes up to 3-4 times a day to help with pain.  Return to the emergency department if you have fevers, swelling that extends from the feet up into the legs, shortness of breath, red streaking up the legs, thick, mucus-like drainage from your wounds, or other new, concerning symptoms.

## 2020-12-06 NOTE — ED Provider Notes (Signed)
[facility]-EMERGENCY DEPT Provider Note   CSN: [id number] Arrival date & time: 12/05/20  1439     History Chief Complaint  Patient presents with  . Foot Pain    [name] is a 55 y.o. male with a history of bipolar 1 disorder, CHF, COPD, polysubstance use disorder, schizoaffective disorder, homelessness, and QT prolongation who presents to the emergency department with a chief complaint of constant, sharp, throbbing bilateral foot pain that has been gradually worsening over the last 5 to 6 days.  He states that he has been compliant with all of his medications.  Per chart review, he has a history of noncompliance in the past.  He states that he had a scab noted to the right great toe that "fell off" about 3 days ago.  He denies fevers, chills, redness, warmth, red streaking.  He denies drainage or bleeding from the bilateral feet.  He was admitted in December for CHF exacerbation, but reports that he has been compliant with Lasix since that time.  He reports that he was having some pain to his left chest that has been ongoing for more than a week.  Pain is sharp and located over the left chest.  It is nonradiating.  No associated shortness of breath, palpitations, leg swelling, diaphoresis, arm pain, neck pain, vomiting.   The history is provided by the patient. No language interpreter was used.       Past Medical History:  Diagnosis Date  . Asthma   . Bipolar 1 disorder (HCC)   . CHF (congestive heart failure) (HCC)   . COPD (chronic obstructive pulmonary disease) (HCC)   . Depression   . Diabetes mellitus without complication (HCC)   . GERD (gastroesophageal reflux disease)   . Homelessness   . Hypertension   . QT prolongation   . Schizoaffective disorder [facility])     Patient Active Problem List   Diagnosis Date Noted  . CHF (congestive heart failure) (HCC) 11/14/2020  . CHF exacerbation (HCC) 11/13/2020  . Hypokalemia   . Hyperlipidemia   . Acute  respiratory failure with hypoxia (HCC) 10/26/2020  . COPD with acute exacerbation (HCC) 10/14/2020  . Acute on chronic combined systolic and diastolic CHF (congestive heart failure) (HCC) 10/02/2020  . Near syncope 07/21/2020  . Schizoaffective disorder (HCC) 06/24/2019  . SOB (shortness of breath)   . QT prolongation 05/25/2019  . Cocaine abuse (HCC)   . Elevated troponin   . Homelessness   . Suicidal ideation   . Bilateral foot pain 04/05/2019  . Housing situation unstable 03/28/2019  . Bilateral lower extremity edema   . Asthma 12/26/2016  . Tobacco use disorder 12/26/2016  . Schizophrenia (HCC) 12/26/2016  . Cocaine abuse with cocaine-induced mood disorder (HCC) 09/06/2016  . Alcohol use disorder, mild, abuse 07/31/2016  . Substance induced mood disorder (HCC) 07/30/2016  . Schizoaffective disorder, bipolar type (HCC) 05/31/2016  . Cocaine use disorder, severe, dependence (HCC) 05/31/2016  . Essential hypertension 05/31/2016    History reviewed. No pertinent surgical history.     Family History  Problem Relation Age of Onset  . Drug abuse Mother   . Suicidality Mother     Social History   Tobacco Use  . Smoking status: Current Every Day Smoker    Years: 30.00    Types: Cigarettes  . Smokeless tobacco: Never Used  Vaping Use  . Vaping Use: Never used  Substance Use Topics  . Alcohol use: Yes    Comment: infrequently  .  Drug use: Yes    Types: Cocaine, Marijuana    Home Medications Prior to Admission medications   Medication Sig Start Date End Date Taking? Authorizing Provider  albuterol (VENTOLIN HFA) 108 (90 Base) MCG/ACT inhaler Inhale 2 puffs into the lungs every 6 (six) hours as needed for wheezing or shortness of breath. 11/16/20   Pokhrel, Rebekah Chesterfield, MD  atorvastatin (LIPITOR) 20 MG tablet Take 1 tablet (20 mg total) by mouth daily. 11/16/20   Pokhrel, Rebekah Chesterfield, MD  carvedilol (COREG) 3.125 MG tablet Take 1 tablet (3.125 mg total) by mouth 2 (two) times  daily with a meal. 11/16/20   Pokhrel, Laxman, MD  cloNIDine (CATAPRES) 0.1 MG tablet Take 1 tablet (0.1 mg total) by mouth 3 (three) times daily. 11/16/20   Pokhrel, Rebekah Chesterfield, MD  clopidogrel (PLAVIX) 75 MG tablet Take 1 tablet (75 mg total) by mouth daily. 11/16/20   Pokhrel, Rebekah Chesterfield, MD  divalproex (DEPAKOTE ER) 500 MG 24 hr tablet Take 1 tablet (500 mg total) by mouth in the morning and at bedtime. 11/16/20   Pokhrel, Rebekah Chesterfield, MD  furosemide (LASIX) 40 MG tablet Take 1 tablet (40 mg total) by mouth daily. 11/16/20   Pokhrel, Rebekah Chesterfield, MD  gabapentin (NEURONTIN) 100 MG capsule Take 2 capsules (200 mg total) by mouth 2 (two) times daily. 11/16/20   Pokhrel, Rebekah Chesterfield, MD  hydrALAZINE (APRESOLINE) 50 MG tablet Take 1 tablet (50 mg total) by mouth every 8 (eight) hours. 11/16/20   Pokhrel, Rebekah Chesterfield, MD  isosorbide mononitrate (IMDUR) 30 MG 24 hr tablet Take 1 tablet (30 mg total) by mouth daily. 11/16/20 12/16/20  Pokhrel, Rebekah Chesterfield, MD  lisinopril (ZESTRIL) 20 MG tablet Take 1 tablet (20 mg total) by mouth in the morning and at bedtime. 11/16/20   Pokhrel, Rebekah Chesterfield, MD  neomycin-bacitracin-polymyxin (NEOSPORIN) ointment Apply 1 application topically 2 (two) times daily. 11/16/20   Pokhrel, Rebekah Chesterfield, MD  senna-docusate (SENOKOT-S) 8.6-50 MG tablet Take 1 tablet by mouth at bedtime as needed for mild constipation. 10/07/20   Marguerita Merles Latif, DO  spironolactone (ALDACTONE) 25 MG tablet Take 0.5 tablets (12.5 mg total) by mouth daily. 11/16/20 12/16/20  Pokhrel, Rebekah Chesterfield, MD  traZODone (DESYREL) 50 MG tablet Take 1 tablet (50 mg total) by mouth at bedtime. 11/16/20   Pokhrel, Rebekah Chesterfield, MD  umeclidinium bromide (INCRUSE ELLIPTA) 62.5 MCG/INH AEPB Inhale 1 puff into the lungs daily. 11/16/20   Pokhrel, Rebekah Chesterfield, MD    Allergies    Ibuprofen and Acetaminophen  Review of Systems   Review of Systems  Constitutional: Negative for appetite change, chills and fever.  Respiratory: Negative for shortness of breath.    Cardiovascular: Negative for chest pain.  Gastrointestinal: Negative for abdominal pain, diarrhea, nausea and vomiting.  Genitourinary: Negative for dysuria.  Musculoskeletal: Positive for arthralgias and myalgias. Negative for back pain, neck pain and neck stiffness.  Skin: Positive for wound. Negative for rash.  Allergic/Immunologic: Negative for immunocompromised state.  Neurological: Negative for weakness, numbness and headaches.  Psychiatric/Behavioral: Negative for confusion.    Physical Exam Updated Vital Signs BP 139/87 (BP Location: Right Arm)   Pulse (!) 106   Temp 98.6 F (37 C) (Oral)   Resp (!) 21   SpO2 98%   Physical Exam Vitals and nursing note reviewed.  Constitutional:      Appearance: He is well-developed.  HENT:     Head: Normocephalic.  Eyes:     Conjunctiva/sclera: Conjunctivae normal.  Cardiovascular:     Rate and Rhythm: Normal rate and regular rhythm.  Pulses: Normal pulses.     Heart sounds: Normal heart sounds. No murmur heard. No friction rub. No gallop.   Pulmonary:     Effort: Pulmonary effort is normal.     Comments: End expiratory wheezes heard in the bilateral bases.  Airway is intact as patient is yelling and able to speak in complete, fluent sentences without increased work of breathing.  Reproducible tenderness palpation to the left chest wall.  No overlying rashes.  No crepitus or step-offs. Abdominal:     General: There is no distension.     Palpations: Abdomen is soft. There is no mass.     Tenderness: There is no abdominal tenderness. There is no right CVA tenderness, left CVA tenderness, guarding or rebound.     Hernia: No hernia is present.  Musculoskeletal:        General: No tenderness.     Cervical back: Neck supple.     Right lower leg: No edema.     Left lower leg: No edema.     Comments: No peripheral edema.  DP and PT pulses are 2+ and symmetric.  Sensation is intact and equal throughout.  5 out of 5 strength against  resistance with dorsiflexion plantarflexion.  Skin:    General: Skin is warm and dry.     Comments: Open ulceration noted to the plantar surface of the right great toe.  No active bleeding or drainage.  No bleeding or drainage able to be expressed.  No surrounding erythema, edema, warmth, red streaking.  No evidence of abscess or cellulitis.  There is a callus and fissure noted to the plantar surface of the left great toe.  There is mild swelling noted along the lateral aspect of the foot, but no warmth or redness.  Exam is limited as patient is uncooperative and is diffusely tender to palpation on exam.  Neurovascularly intact.  Neurological:     Mental Status: He is alert.  Psychiatric:        Behavior: Behavior normal.           ED Results / Procedures / Treatments   Labs (all labs ordered are listed, but only abnormal results are displayed) Labs Reviewed  COMPREHENSIVE METABOLIC PANEL - Abnormal; Notable for the following components:      Result Value   CO2 20 (*)    Creatinine, Ser 1.35 (*)    Calcium 8.7 (*)    Albumin 3.2 (*)    AST 46 (*)    All other components within normal limits  CBC WITH DIFFERENTIAL/PLATELET - Abnormal; Notable for the following components:   Platelets 403 (*)    All other components within normal limits    EKG EKG Interpretation  Date/Time:  Thursday December 06 2020 00:43:38 EST Ventricular Rate:  108 PR Interval:    QRS Duration: 94 QT Interval:  372 QTC Calculation: 501 R Axis:   4 Text Interpretation: Sinus tachycardia Biatrial enlargement LVH with secondary repolarization abnormality Prolonged QT interval Confirmed by Drema Pry [phone]) on 12/06/2020 1:33:37 AM   Radiology DG Chest Portable 1 View  Result Date: 12/06/2020 CLINICAL DATA:  Chest pain EXAM: PORTABLE CHEST 1 VIEW COMPARISON:  11/27/2020 FINDINGS: Single frontal view of the chest demonstrates stable enlargement of the cardiac silhouette. No airspace disease,  effusion, or pneumothorax. No acute bony abnormalities. IMPRESSION: 1. Stable enlarged cardiac silhouette.  No acute process. Electronically Signed   By: Sharlet Salina M.D.   On: 12/06/2020 00:41    Procedures  Procedures (including critical care time)  Medications Ordered in ED Medications  ondansetron (ZOFRAN) 4 MG/2ML injection (has no administration in time range)  ketorolac (TORADOL) injection 30 mg (30 mg Intramuscular Given 12/06/20 0100)    ED Course  I have reviewed the triage vital signs and the nursing notes.  Pertinent labs & imaging results that were available during my care of the patient were reviewed by me and considered in my medical decision making (see chart for details).    MDM Rules/Calculators/A&P                          55 year old male with a history of bipolar 1 disorder, CHF, COPD, polysubstance use disorder, schizoaffective disorder, homelessness, and QT prolongation brought in by EMS for bilateral foot pain and chest pain.  In regards to his chest pain, he has a prolonged QTC.  He has sinus tachycardia.  Pain is reproducible to the left chest wall.  I have a low suspicion for ACS at this time given chest wall tenderness, but planned to check a troponin.  However, patient refused.  He also refused chest x-ray.  He denies any recent cocaine use.  Doubt coronary vasospasm, aortic dissection, PE, or COPD exacerbation.  Regards to his foot pain, there is an open ulceration noted to the plantar surface of the right great toe, but there is no obvious cellulitis, abscess, or drainage from the area.  He did recently have an MRI that did not demonstrate any osteomyelitis.  He also had an unremarkable x-ray on 12/28.  Exam today sounds similar to previous exam.  I have a low suspicion for osteomyelitis or deep space infection of the foot.  Wound care will be provided.  He will be given home wound care instructions as well as a referral to the wound care clinic.  Other  patient endorses an anaphylactic allergy to ibuprofen, per chart review, he has tolerated Toradol in the past.  Will be given Toradol for pain control.  I do not feel comfortable administering narcotic medications given the patient's history of polysubstance use.  ER return precautions given.  He is hemodynamically stable and in no acute distress.  Safe for discharge to home with outpatient follow-up to wound care.  He can also follow-up with his PCP if he continues to have chest wall pain  Final Clinical Impression(s) / ED Diagnoses Final diagnoses:  Ulcer of both feet, limited to breakdown of skin ([place])  Chest wall pain    Rx / DC Orders ED Discharge Orders    None       Joanne Gavel, PA-C 12/06/20 0240    Fatima Blank, MD 12/06/20 1845

## 2020-12-06 NOTE — ED Notes (Signed)
Pt unable to ambulate, PA at bedside . Pt states he hasn't been able to ambulate in months and uses a wheelchair. Pt continues to ask for a real doctor and be transferred to [facility].

## 2020-12-06 NOTE — ED Notes (Signed)
Pt has refused chest x ray and states that he didn't. Pt states his issue is with his foot not his chest and continues to demand 5 packs of graham crackers and 1 carton of 2% milk. Charge nurse and provider made aware.

## 2020-12-06 NOTE — ED Notes (Signed)
Pt currently on the phone with 9-1-1 stating he's getting advice. Charge nurse made aware.

## 2020-12-06 NOTE — ED Notes (Signed)
Pt is refusing EKG PA made aware.

## 2020-12-06 NOTE — ED Notes (Signed)
Pt refuses to sign e signature. Paper copy of d/c papers given.

## 2020-12-07 ENCOUNTER — Inpatient Hospital Stay (HOSPITAL_COMMUNITY)
Admission: EM | Admit: 2020-12-07 | Discharge: 2020-12-10 | DRG: 638 | Disposition: A | Discharge status: Home / Self Care | Payer: Medicaid Other | Attending: Family Medicine | Admitting: Family Medicine

## 2020-12-07 DIAGNOSIS — E11621 Type 2 diabetes mellitus with foot ulcer: Principal | ICD-10-CM | POA: Diagnosis present

## 2020-12-07 DIAGNOSIS — F250 Schizoaffective disorder, bipolar type: Secondary | ICD-10-CM | POA: Diagnosis present

## 2020-12-07 DIAGNOSIS — I110 Hypertensive heart disease with heart failure: Secondary | ICD-10-CM | POA: Diagnosis present

## 2020-12-07 DIAGNOSIS — Z5900 Homelessness unspecified: Secondary | ICD-10-CM

## 2020-12-07 DIAGNOSIS — I5042 Chronic combined systolic (congestive) and diastolic (congestive) heart failure: Secondary | ICD-10-CM | POA: Diagnosis present

## 2020-12-07 DIAGNOSIS — K219 Gastro-esophageal reflux disease without esophagitis: Secondary | ICD-10-CM | POA: Diagnosis present

## 2020-12-07 DIAGNOSIS — I959 Hypotension, unspecified: Secondary | ICD-10-CM | POA: Diagnosis not present

## 2020-12-07 DIAGNOSIS — I2510 Atherosclerotic heart disease of native coronary artery without angina pectoris: Secondary | ICD-10-CM | POA: Diagnosis present

## 2020-12-07 DIAGNOSIS — L089 Local infection of the skin and subcutaneous tissue, unspecified: Secondary | ICD-10-CM

## 2020-12-07 DIAGNOSIS — E785 Hyperlipidemia, unspecified: Secondary | ICD-10-CM | POA: Diagnosis present

## 2020-12-07 DIAGNOSIS — E11628 Type 2 diabetes mellitus with other skin complications: Secondary | ICD-10-CM

## 2020-12-07 DIAGNOSIS — Z20822 Contact with and (suspected) exposure to covid-19: Secondary | ICD-10-CM | POA: Diagnosis present

## 2020-12-07 DIAGNOSIS — J441 Chronic obstructive pulmonary disease with (acute) exacerbation: Secondary | ICD-10-CM | POA: Diagnosis present

## 2020-12-07 DIAGNOSIS — E872 Acidosis, unspecified: Secondary | ICD-10-CM

## 2020-12-07 DIAGNOSIS — I10 Essential (primary) hypertension: Secondary | ICD-10-CM

## 2020-12-07 DIAGNOSIS — Z7902 Long term (current) use of antithrombotics/antiplatelets: Secondary | ICD-10-CM

## 2020-12-07 DIAGNOSIS — Z23 Encounter for immunization: Secondary | ICD-10-CM

## 2020-12-07 DIAGNOSIS — N179 Acute kidney failure, unspecified: Secondary | ICD-10-CM | POA: Diagnosis present

## 2020-12-07 DIAGNOSIS — F17210 Nicotine dependence, cigarettes, uncomplicated: Secondary | ICD-10-CM | POA: Diagnosis present

## 2020-12-07 DIAGNOSIS — Z79899 Other long term (current) drug therapy: Secondary | ICD-10-CM

## 2020-12-07 DIAGNOSIS — L97509 Non-pressure chronic ulcer of other part of unspecified foot with unspecified severity: Secondary | ICD-10-CM | POA: Diagnosis present

## 2020-12-07 DIAGNOSIS — D539 Nutritional anemia, unspecified: Secondary | ICD-10-CM | POA: Diagnosis present

## 2020-12-07 DIAGNOSIS — L97519 Non-pressure chronic ulcer of other part of right foot with unspecified severity: Secondary | ICD-10-CM | POA: Diagnosis present

## 2020-12-08 ENCOUNTER — Emergency Department (HOSPITAL_COMMUNITY): Payer: Medicaid Other

## 2020-12-08 ENCOUNTER — Observation Stay (HOSPITAL_COMMUNITY): Payer: Medicaid Other

## 2020-12-08 ENCOUNTER — Other Ambulatory Visit: Payer: Self-pay

## 2020-12-08 DIAGNOSIS — L089 Local infection of the skin and subcutaneous tissue, unspecified: Secondary | ICD-10-CM | POA: Diagnosis not present

## 2020-12-08 DIAGNOSIS — I10 Essential (primary) hypertension: Secondary | ICD-10-CM

## 2020-12-08 DIAGNOSIS — E872 Acidosis: Secondary | ICD-10-CM | POA: Diagnosis not present

## 2020-12-08 DIAGNOSIS — E11628 Type 2 diabetes mellitus with other skin complications: Secondary | ICD-10-CM | POA: Diagnosis not present

## 2020-12-08 DIAGNOSIS — E11621 Type 2 diabetes mellitus with foot ulcer: Secondary | ICD-10-CM | POA: Diagnosis present

## 2020-12-08 DIAGNOSIS — L97509 Non-pressure chronic ulcer of other part of unspecified foot with unspecified severity: Secondary | ICD-10-CM | POA: Diagnosis present

## 2020-12-08 LAB — CBC
HCT: 38.7 % — ABNORMAL LOW (ref 39.0–52.0)
Hemoglobin: 13.3 g/dL (ref 13.0–17.0)
MCH: 33.9 pg (ref 26.0–34.0)
MCHC: 34.4 g/dL (ref 30.0–36.0)
MCV: 98.7 fL (ref 80.0–100.0)
Platelets: 328 10*3/uL (ref 150–400)
RBC: 3.92 MIL/uL — ABNORMAL LOW (ref 4.22–5.81)
RDW: 15.5 % (ref 11.5–15.5)
WBC: 6.9 10*3/uL (ref 4.0–10.5)
nRBC: 0 % (ref 0.0–0.2)

## 2020-12-08 LAB — URINALYSIS, ROUTINE W REFLEX MICROSCOPIC
Bilirubin Urine: NEGATIVE
Glucose, UA: NEGATIVE mg/dL
Hgb urine dipstick: NEGATIVE
Ketones, ur: NEGATIVE mg/dL
Leukocytes,Ua: NEGATIVE
Nitrite: NEGATIVE
Protein, ur: 100 mg/dL — AB
Specific Gravity, Urine: 1.033 — ABNORMAL HIGH (ref 1.005–1.030)
pH: 5 (ref 5.0–8.0)

## 2020-12-08 LAB — RESP PANEL BY RT-PCR (FLU A&B, COVID) ARPGX2
Influenza A by PCR: NEGATIVE
Influenza B by PCR: NEGATIVE
SARS Coronavirus 2 by RT PCR: NEGATIVE

## 2020-12-08 LAB — LACTIC ACID, PLASMA
Lactic Acid, Venous: 1.6 mmol/L (ref 0.5–1.9)
Lactic Acid, Venous: 2.2 mmol/L (ref 0.5–1.9)

## 2020-12-08 LAB — BASIC METABOLIC PANEL
Anion gap: 13 (ref 5–15)
BUN: 17 mg/dL (ref 6–20)
CO2: 21 mmol/L — ABNORMAL LOW (ref 22–32)
Calcium: 8.4 mg/dL — ABNORMAL LOW (ref 8.9–10.3)
Chloride: 103 mmol/L (ref 98–111)
Creatinine, Ser: 1.39 mg/dL — ABNORMAL HIGH (ref 0.61–1.24)
GFR, Estimated: >60 mL/min (ref >60)
Glucose, Bld: 85 mg/dL (ref 70–99)
Potassium: 4.1 mmol/L (ref 3.5–5.1)
Sodium: 137 mmol/L (ref 135–145)

## 2020-12-08 LAB — CBG MONITORING, ED: Glucose-Capillary: 100 mg/dL — ABNORMAL HIGH (ref 70–99)

## 2020-12-08 IMAGING — MR MR FOOT*R* WO/W CM
9 series · 40 of 40 positions shown · IV contrast (gadavist)
Comparison: Right foot x-rays from same day. MRI right forefoot
dated [DATE].

CLINICAL DATA: Right great toe ulcer.

EXAM:
MRI OF THE RIGHT FOREFOOT WITHOUT AND WITH CONTRAST
TECHNIQUE: Multiplanar, multisequence MR imaging of the right forefoot was
performed before and after the administration of intravenous
contrast.
CONTRAST:  9.5mL GADAVIST GADOBUTROL 1 MMOL/ML IV SOLN

[Series 5: STIR · sagittal · right · 3.0mm · 0.70mm/px · 4 of 34 slices shown]
[im 1/34]
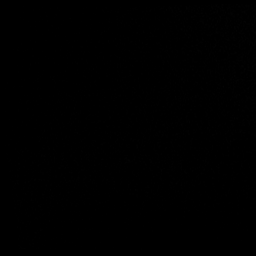
[im 12/34]
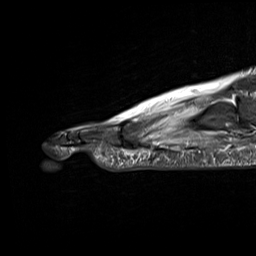
[im 23/34]
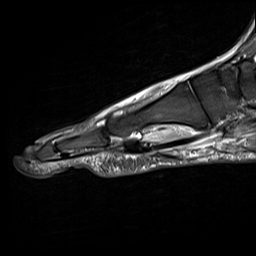
[im 34/34]
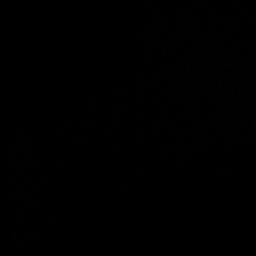

[Series 6: T1 · axial · right · 3.0mm · 0.52mm/px · z∈[-103,+9]mm · 4 of 30 slices shown (1 of 2)]
[im 1/30]
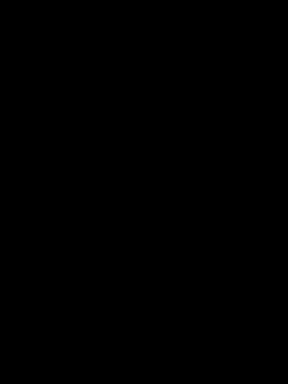
[im 10/30]
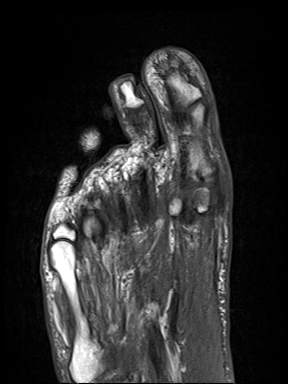
[im 20/30]
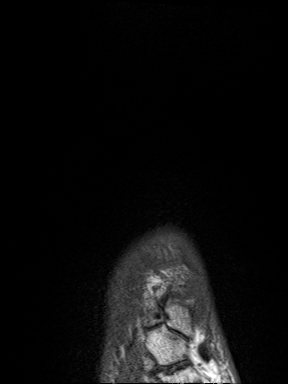
[im 30/30]
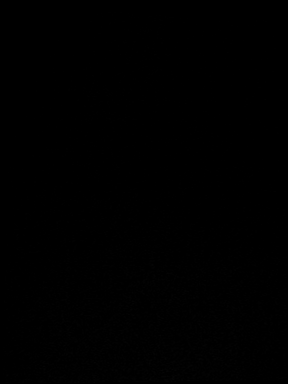

[Series 7: T2 fat-sat · axial · right · 3.0mm · 0.45mm/px · z∈[-108,+4]mm · 4 of 30 slices shown (1 of 2)]
[im 1/30]
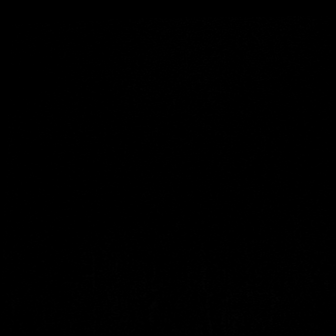
[im 10/30]
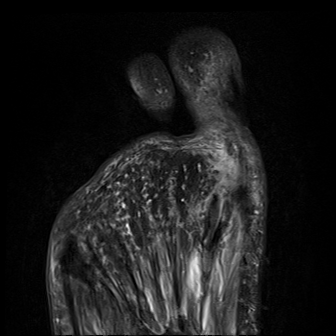
[im 20/30]
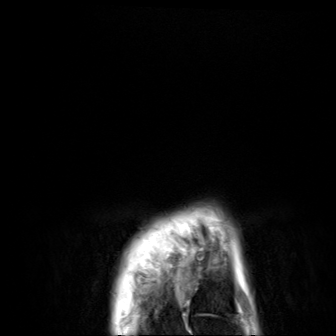
[im 30/30]
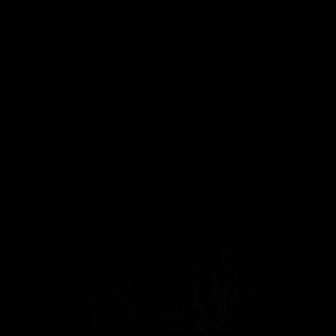

[Series 8: T1 fat-sat · coronal · non-contrast · right · 3.0mm · 0.59mm/px · 5 of 36 slices shown (1 of 3)]
[im 1/36]
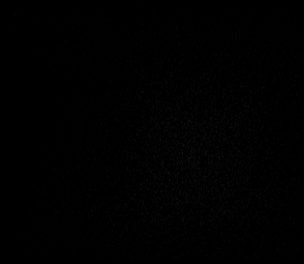
[im 9/36]
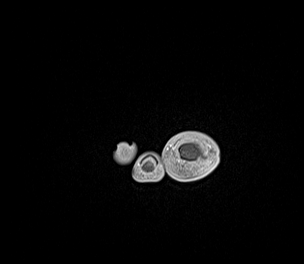
[im 18/36]
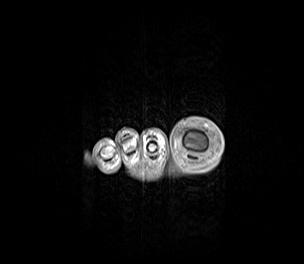
[im 27/36]
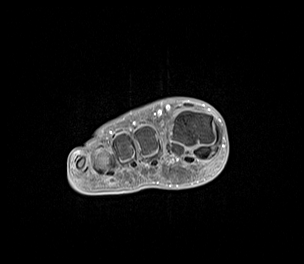
[im 36/36]
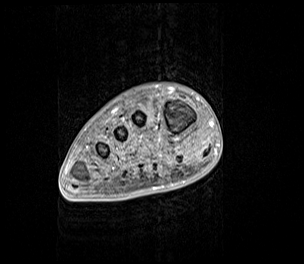

[Series 9: T1 · coronal · right · 3.0mm · 0.47mm/px · 5 of 36 slices shown (2 of 2)]
[im 1/36]
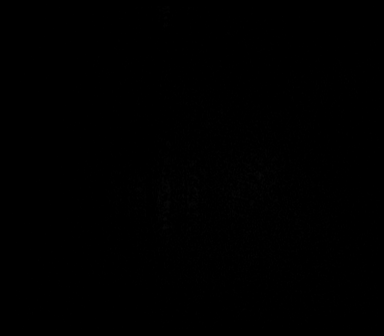
[im 9/36]
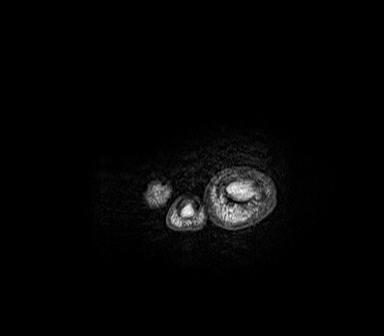
[im 18/36]
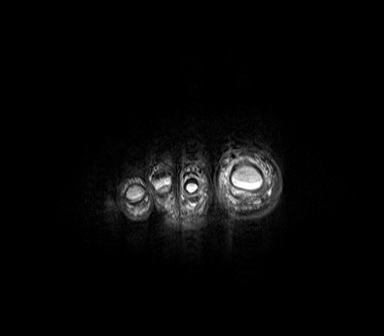
[im 27/36]
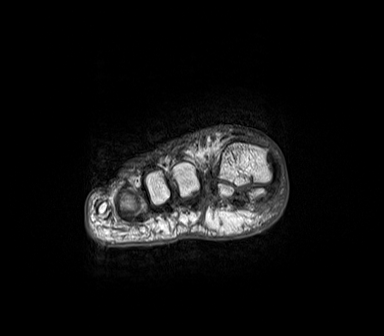
[im 36/36]
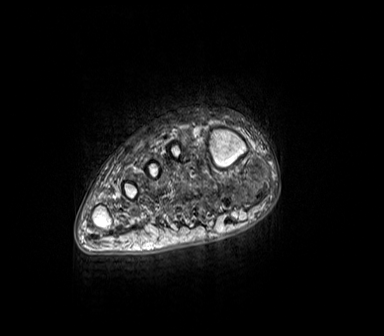

[Series 10: T2 fat-sat · coronal · right · 3.0mm · 0.49mm/px · 5 of 36 slices shown (2 of 2)]
[im 1/36]
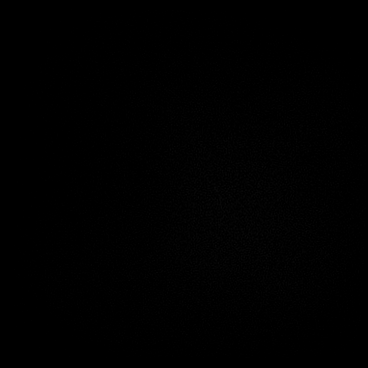
[im 9/36]
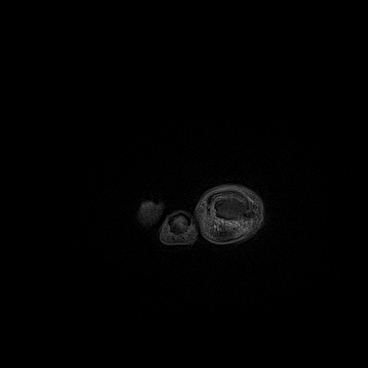
[im 18/36]
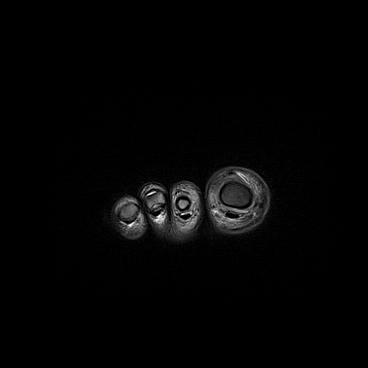
[im 27/36]
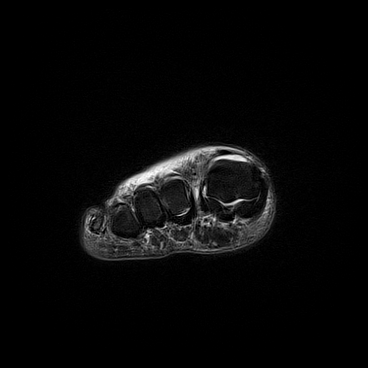
[im 36/36]
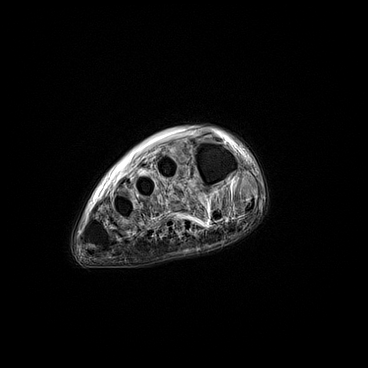

[Series 11: T1 fat-sat post-contrast · coronal · right · 3.0mm · 0.59mm/px · 5 of 36 slices shown]
[im 1/36]
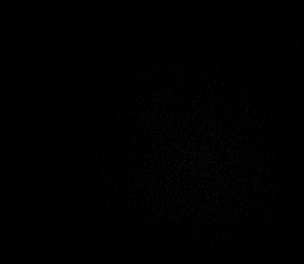
[im 9/36]
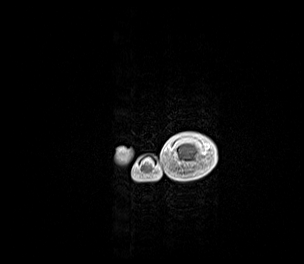
[im 18/36]
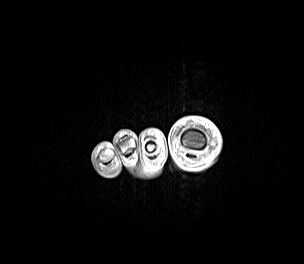
[im 27/36]
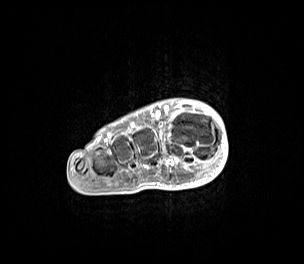
[im 36/36]
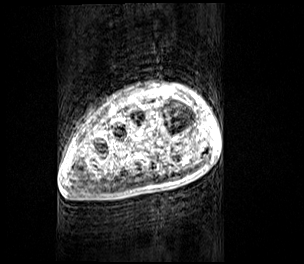

[Series 12: T1 fat-sat · sagittal · right · 3.0mm · 0.59mm/px · 4 of 33 slices shown (2 of 3)]
[im 1/33]
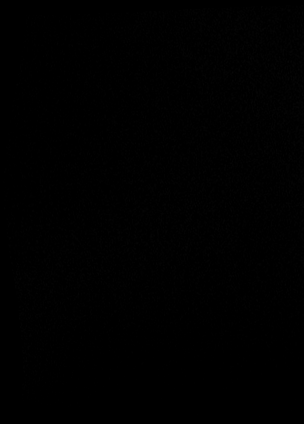
[im 11/33]
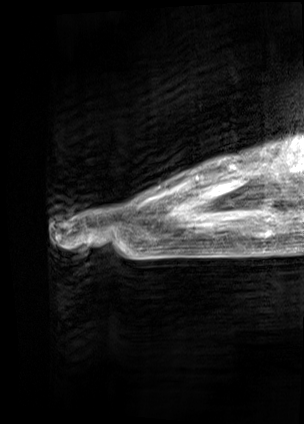
[im 22/33]
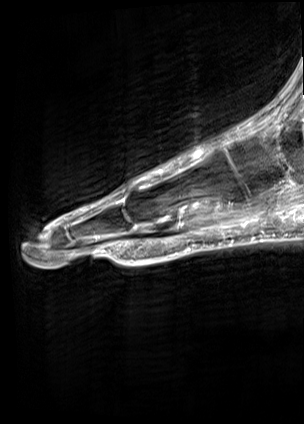
[im 33/33]
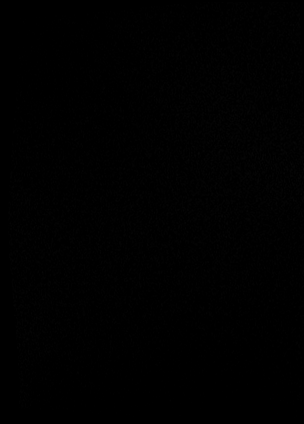

[Series 13: T1 fat-sat · axial · right · 3.0mm · 0.56mm/px · z∈[-104,+7]mm · 4 of 30 slices shown (3 of 3)]
[im 1/30]
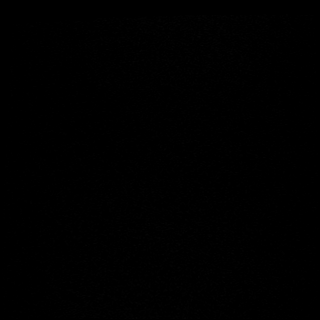
[im 10/30]
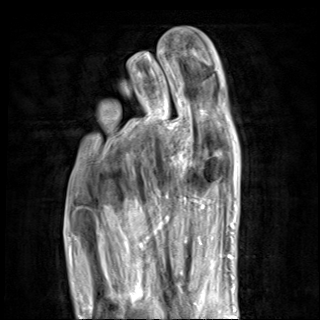
[im 20/30]
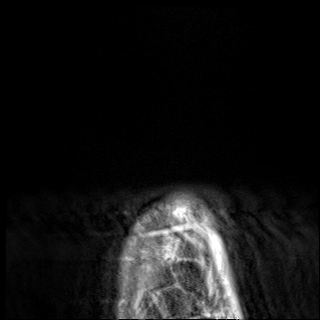
[im 30/30]
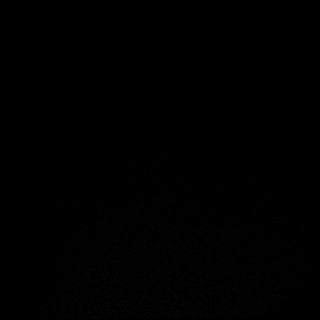

[40 of 40 positions shown; findings below may reference images not displayed]

FINDINGS: Despite efforts by the technologist and patient, motion artifact is
present on today's exam and could not be eliminated. This reduces
exam sensitivity and specificity.

Bones/Joint/Cartilage

No suspicious marrow signal abnormality. No fracture or dislocation.
Unchanged mild first MTP joint osteoarthritis. No joint effusion.

Ligaments

Collateral ligaments are intact.

Muscles and Tendons
Flexor and extensor tendons are intact. Increased T2 signal within
the intrinsic muscles of the forefoot, nonspecific, but likely
related to diabetic muscle changes.

Soft tissue
Progressive skin ulceration of the medial plantar great toe at the
level of the first IP joint. Mild dorsal forefoot soft tissue
swelling. No fluid collection or hematoma. No soft tissue mass.
IMPRESSION: 1. Progressive skin ulceration of the medial plantar great toe at
the level of the first IP joint. No abscess or evidence of
osteomyelitis.

## 2020-12-08 IMAGING — DX DG FOOT COMPLETE 3+V*R*
3 series · 3 of 3 positions shown · non-contrast
Comparison: [DATE]

CLINICAL DATA: 54-year-old male presenting with diabetic foot
ulcer.

EXAM:
RIGHT FOOT COMPLETE - 3+ VIEW

[foot ap]
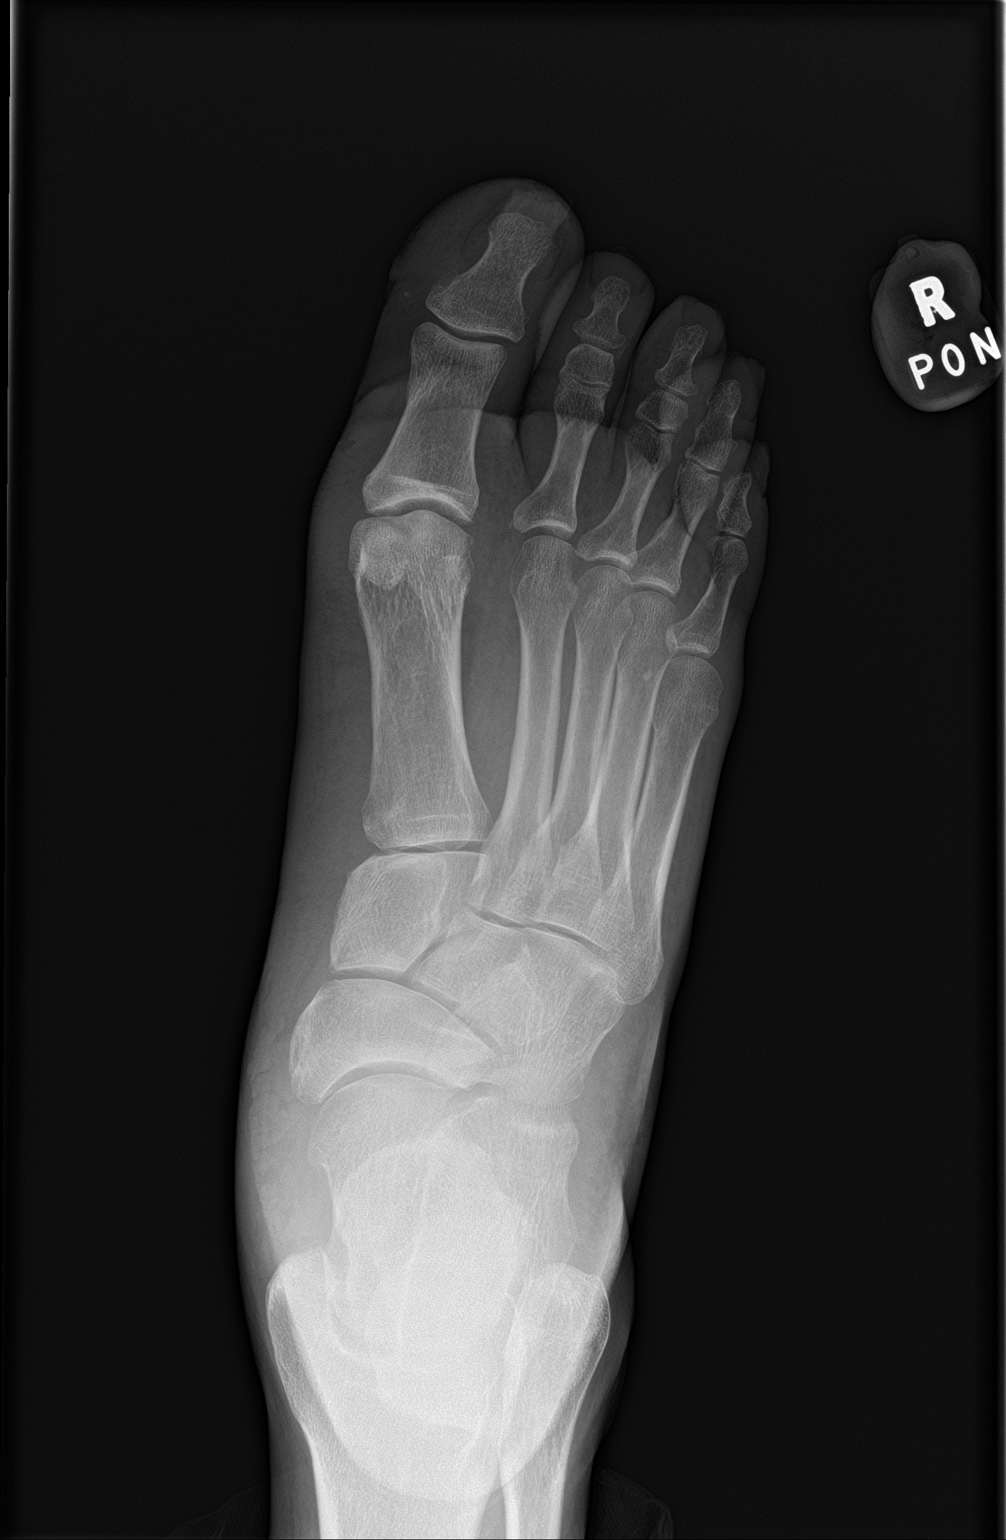

[foot obl]
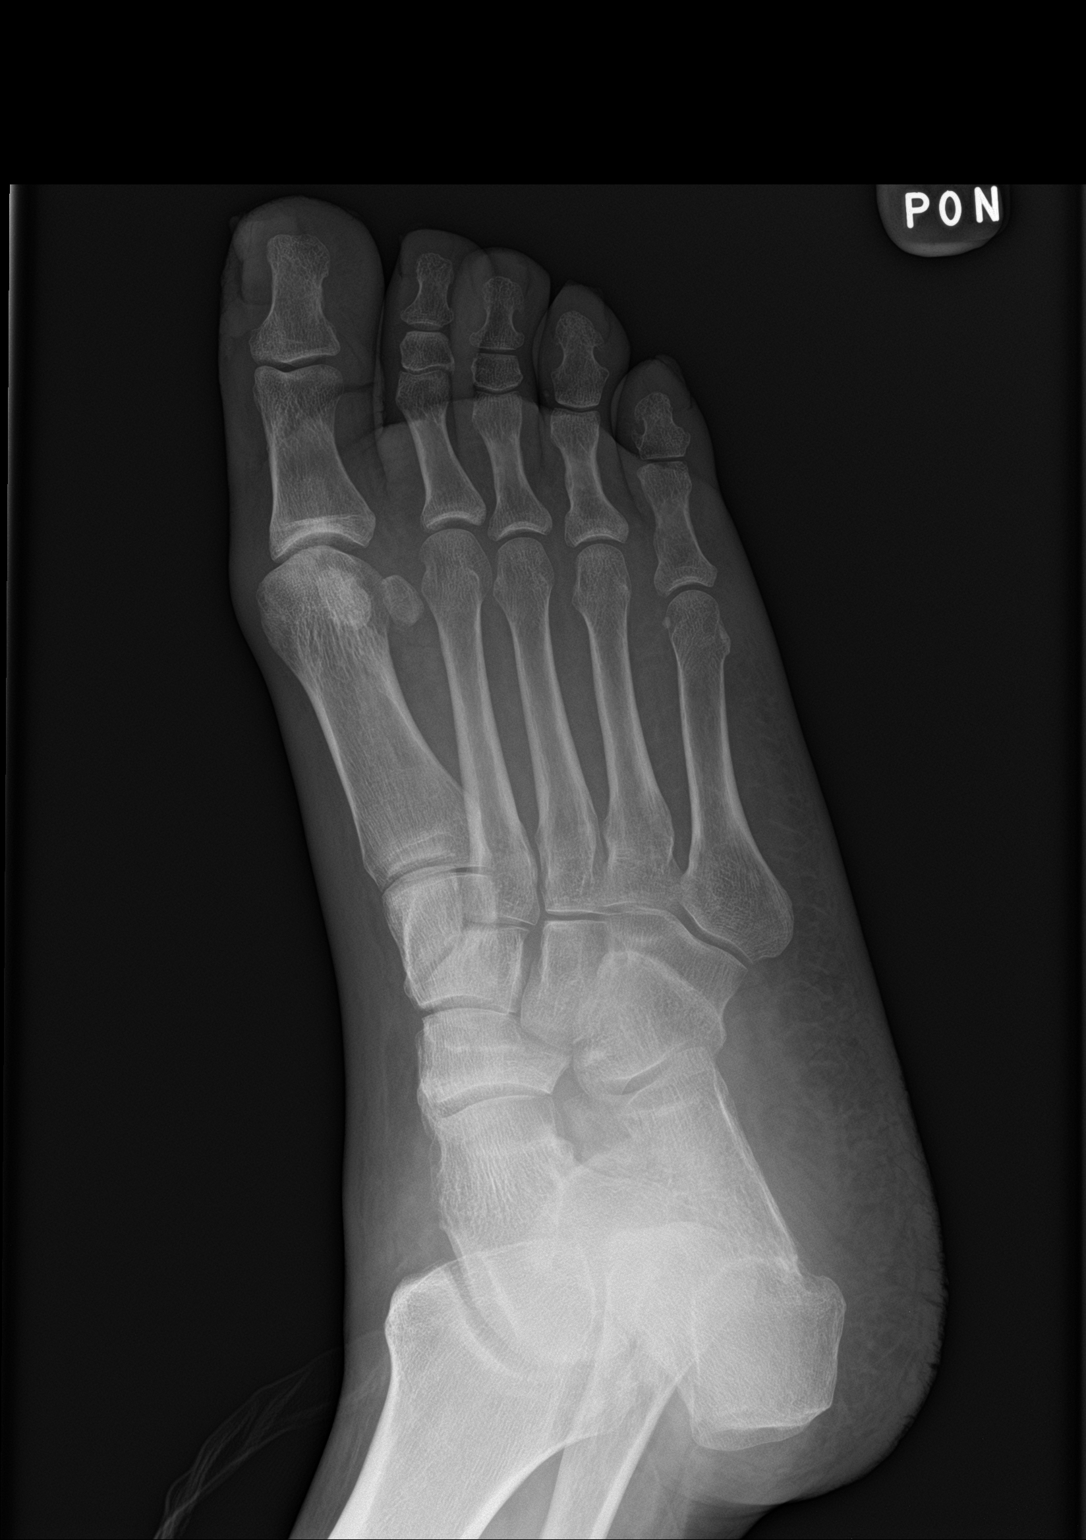

[foot lat]
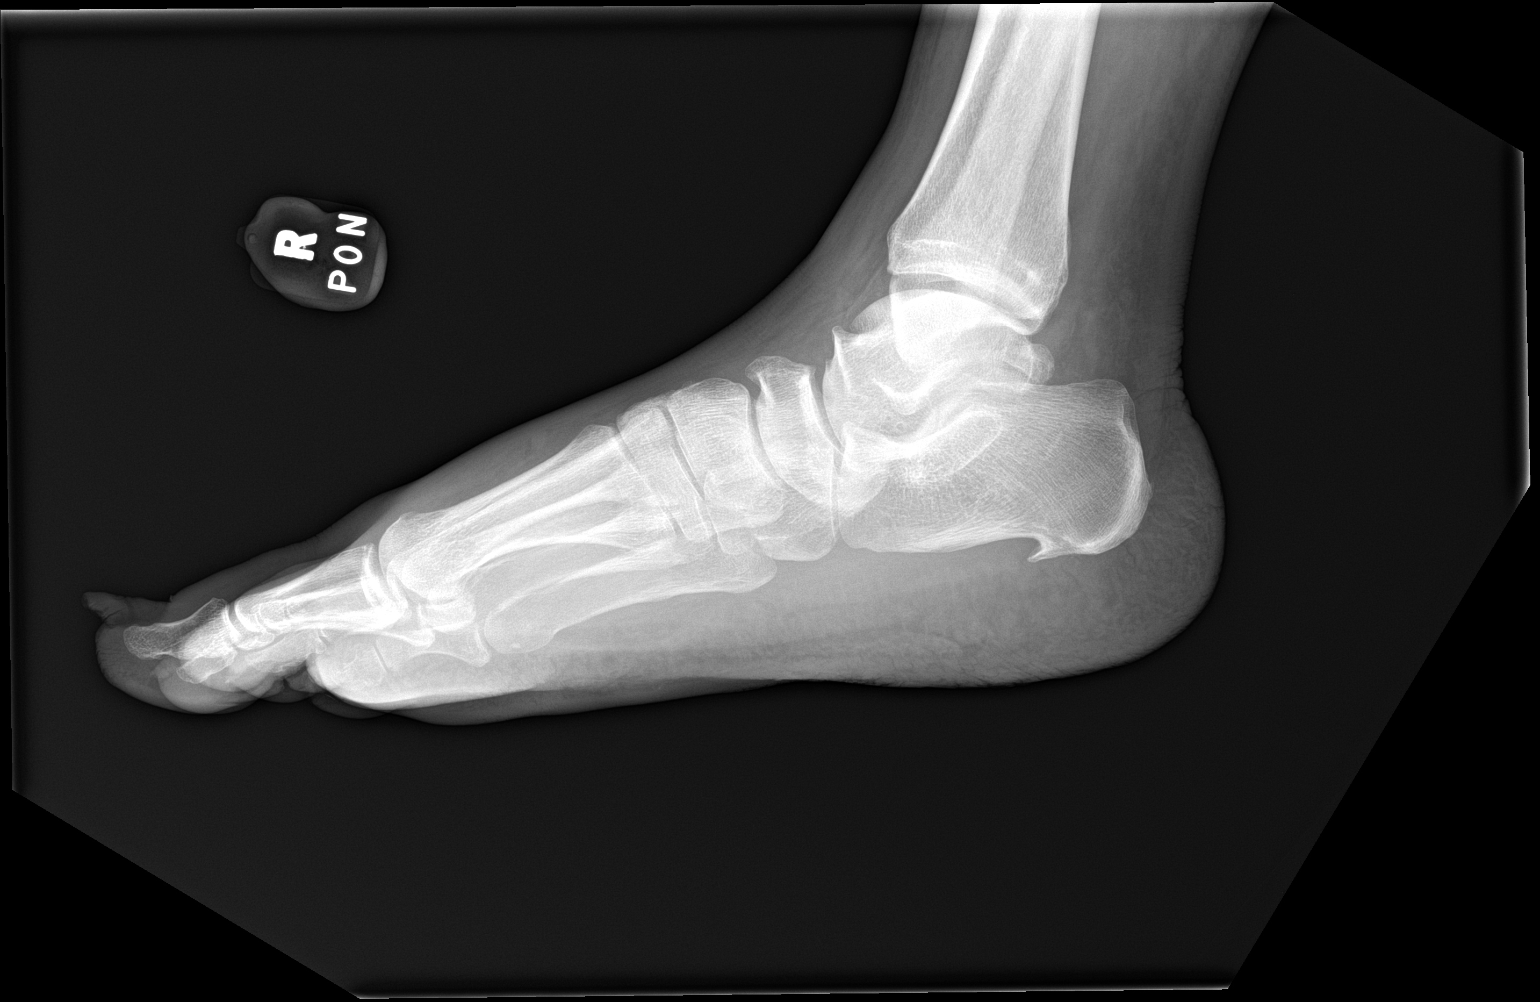

[3 of 3 positions shown; findings below may reference images not displayed]

FINDINGS: There is no evidence of fracture or dislocation. There is no
evidence of arthropathy or other focal bone abnormality. No evidence
of cortical destruction. Mild soft tissue irregularity about the
medial and inferior aspect of the great toe near the interphalangeal
joint, small focal radiopacity within the medial soft tissues.
IMPRESSION: Slight interval decreased size of previously visualized right infra
medial great toe soft tissue ulceration, now with focal radiopaque
foreign body in the medial aspect. No radiographic evidence of
osteomyelitis.

## 2020-12-08 MED ORDER — CLONIDINE HCL 0.1 MG PO TABS
0.1000 mg | ORAL_TABLET | Freq: Three times a day (TID) | ORAL | Status: DC
  Administered 2020-12-08 – 2020-12-10 (×6): 0.1 mg via ORAL
  Filled 2020-12-08 (×8): qty 1

## 2020-12-08 MED ORDER — ATORVASTATIN CALCIUM 10 MG PO TABS
20.0000 mg | ORAL_TABLET | Freq: Every day | ORAL | Status: DC
  Administered 2020-12-08 – 2020-12-09 (×2): 20 mg via ORAL
  Filled 2020-12-08 (×2): qty 2

## 2020-12-08 MED ORDER — PIPERACILLIN-TAZOBACTAM 3.375 G IVPB
3.3750 g | Freq: Three times a day (TID) | INTRAVENOUS | Status: DC
  Administered 2020-12-08 – 2020-12-09 (×2): 3.375 g via INTRAVENOUS
  Filled 2020-12-08 (×3): qty 50

## 2020-12-08 MED ORDER — CARVEDILOL 3.125 MG PO TABS
3.1250 mg | ORAL_TABLET | Freq: Two times a day (BID) | ORAL | Status: DC
  Administered 2020-12-08 – 2020-12-10 (×4): 3.125 mg via ORAL
  Filled 2020-12-08 (×4): qty 1

## 2020-12-08 MED ORDER — SODIUM CHLORIDE 0.9 % IV BOLUS
500.0000 mL | Freq: Once | INTRAVENOUS | Status: AC
  Administered 2020-12-08: 500 mL via INTRAVENOUS

## 2020-12-08 MED ORDER — SPIRONOLACTONE 12.5 MG HALF TABLET
12.5000 mg | ORAL_TABLET | Freq: Every day | ORAL | Status: DC
  Administered 2020-12-08 – 2020-12-10 (×3): 12.5 mg via ORAL
  Filled 2020-12-08 (×3): qty 1

## 2020-12-08 MED ORDER — SODIUM CHLORIDE 0.9 % IV BOLUS: 1000.0000 mL | Freq: Once | INTRAVENOUS | Status: DC

## 2020-12-08 MED ORDER — GADOBUTROL 1 MMOL/ML IV SOLN
9.5000 mL | Freq: Once | INTRAVENOUS | Status: AC | PRN
  Administered 2020-12-08: 9.5 mL via INTRAVENOUS

## 2020-12-08 MED ORDER — OXYCODONE HCL 5 MG PO TABS
5.0000 mg | ORAL_TABLET | Freq: Four times a day (QID) | ORAL | Status: DC | PRN
  Administered 2020-12-08 – 2020-12-09 (×3): 5 mg via ORAL
  Filled 2020-12-08 (×3): qty 1

## 2020-12-08 MED ORDER — CLOPIDOGREL BISULFATE 75 MG PO TABS
75.0000 mg | ORAL_TABLET | Freq: Every day | ORAL | Status: DC
Start: 2020-12-08 — End: 2020-12-10
  Administered 2020-12-08 – 2020-12-10 (×3): 75 mg via ORAL
  Filled 2020-12-08 (×3): qty 1

## 2020-12-08 MED ORDER — SENNOSIDES-DOCUSATE SODIUM 8.6-50 MG PO TABS: 1.0000 | ORAL_TABLET | Freq: Every evening | ORAL | Status: DC | PRN

## 2020-12-08 MED ORDER — PIPERACILLIN-TAZOBACTAM 3.375 G IVPB 30 MIN
3.3750 g | Freq: Once | INTRAVENOUS | Status: AC
  Administered 2020-12-08: 3.375 g via INTRAVENOUS
  Filled 2020-12-08: qty 50

## 2020-12-08 MED ORDER — UMECLIDINIUM BROMIDE 62.5 MCG/INH IN AEPB
1.0000 | INHALATION_SPRAY | Freq: Every day | RESPIRATORY_TRACT | Status: DC
  Administered 2020-12-09: 10:00:00 1 via RESPIRATORY_TRACT
  Filled 2020-12-08: qty 7

## 2020-12-08 MED ORDER — GABAPENTIN 100 MG PO CAPS
200.0000 mg | ORAL_CAPSULE | Freq: Two times a day (BID) | ORAL | Status: DC
  Administered 2020-12-08 – 2020-12-10 (×5): 200 mg via ORAL
  Filled 2020-12-08 (×5): qty 2

## 2020-12-08 MED ORDER — VANCOMYCIN HCL 1250 MG/250ML IV SOLN
1250.0000 mg | Freq: Two times a day (BID) | INTRAVENOUS | Status: DC
  Administered 2020-12-08 – 2020-12-09 (×2): 1250 mg via INTRAVENOUS
  Filled 2020-12-08 (×2): qty 250

## 2020-12-08 MED ORDER — LISINOPRIL 20 MG PO TABS
20.0000 mg | ORAL_TABLET | Freq: Every day | ORAL | Status: DC
Start: 2020-12-08 — End: 2020-12-10
  Administered 2020-12-08 – 2020-12-10 (×3): 20 mg via ORAL
  Filled 2020-12-08 (×3): qty 1

## 2020-12-08 MED ORDER — HYDROMORPHONE HCL 1 MG/ML IJ SOLN
1.0000 mg | Freq: Once | INTRAMUSCULAR | Status: DC
Start: 2020-12-08 — End: 2020-12-08
  Filled 2020-12-08: qty 1

## 2020-12-08 MED ORDER — ONDANSETRON HCL 4 MG/2ML IJ SOLN
4.0000 mg | Freq: Once | INTRAMUSCULAR | Status: AC
  Administered 2020-12-08: 4 mg via INTRAVENOUS
  Filled 2020-12-08: qty 2

## 2020-12-08 MED ORDER — HYDRALAZINE HCL 50 MG PO TABS
50.0000 mg | ORAL_TABLET | Freq: Three times a day (TID) | ORAL | Status: DC
  Administered 2020-12-08 – 2020-12-10 (×6): 50 mg via ORAL
  Filled 2020-12-08: qty 1
  Filled 2020-12-08 (×2): qty 5
  Filled 2020-12-08 (×4): qty 1
  Filled 2020-12-08: qty 5
  Filled 2020-12-08: qty 1

## 2020-12-08 MED ORDER — DIVALPROEX SODIUM ER 500 MG PO TB24
500.0000 mg | ORAL_TABLET | Freq: Every day | ORAL | Status: DC
  Administered 2020-12-08 – 2020-12-10 (×3): 500 mg via ORAL
  Filled 2020-12-08 (×3): qty 1

## 2020-12-08 MED ORDER — ISOSORBIDE MONONITRATE ER 30 MG PO TB24
30.0000 mg | ORAL_TABLET | Freq: Every day | ORAL | Status: DC
Start: 2020-12-08 — End: 2020-12-10
  Administered 2020-12-08 – 2020-12-10 (×3): 30 mg via ORAL
  Filled 2020-12-08 (×3): qty 1

## 2020-12-08 MED ORDER — ENOXAPARIN SODIUM 40 MG/0.4ML ~~LOC~~ SOLN
40.0000 mg | SUBCUTANEOUS | Status: DC
  Administered 2020-12-08 – 2020-12-09 (×2): 40 mg via SUBCUTANEOUS
  Filled 2020-12-08 (×2): qty 0.4

## 2020-12-08 MED ORDER — HYDROMORPHONE HCL 1 MG/ML IJ SOLN
1.0000 mg | Freq: Once | INTRAMUSCULAR | Status: AC
  Administered 2020-12-08: 1 mg via INTRAMUSCULAR

## 2020-12-08 MED ORDER — FUROSEMIDE 40 MG PO TABS
40.0000 mg | ORAL_TABLET | Freq: Every day | ORAL | Status: DC
Start: 2020-12-08 — End: 2020-12-10
  Administered 2020-12-08 – 2020-12-10 (×3): 40 mg via ORAL
  Filled 2020-12-08: qty 1
  Filled 2020-12-08: qty 2
  Filled 2020-12-08: qty 1

## 2020-12-08 NOTE — H&P (Addendum)
Family Medicine Teaching [facility] Admission History and Physical Service Pager: [phone]  Patient name: [name] Medical record number: [id number] Date of birth: 01/04/1966 Age: 55 y.o. Gender: male  Primary Care Provider: Milas Hock, [facility] Health Consultants: None Code Status: FULL Preferred Emergency Contact: [phone] Narda Amber - friend.  Chief Complaint: R Foot Pain/Ulcer  Assessment and Plan: [name] is a 55 y.o. male presenting with worsening pain associated with a R diabetic foot ulcer. PMH is significant for DM2, Schizoaffective disorder (bipolar type), COPD, polysubstance abuse, CHF, HTN, and homelessness.  R Great Toe Ulcer: Patient presents with worsening pain and swelling at the site of his R great toe ulcer.  He was previously evaluated in the emergency department on 12/05/2020 where his foot ulceration was also one of his complaints.  At that time a repeat MRI was not done as he recently had 1 that did not show osteomyelitis and there was low suspicion for osteomyelitis at that time.  Patient states that since then his pain and wound have worsened.  He was referred to wound care clinic upon discharge at that time.  On physical exam, patient has a tender, stage 4 ulcer on the inframedial aspect of right great toe as well as multiple calluses and overall very poor foot hygiene (see media tab for images). Patient is afebrile without leukocytosis or vital sign abnormalities (other than HTN), but his lactic acid is slightly elevated at 2.2. X-ray obtained in the ED did not show evidence of osteomyelitis, but did demonstrate ulceration and very small foreign body. Leading diagnosis is diabetic foot ulcer with osteomyelitis vs cellulitis.  -Admit to FPTS, Med-Surg, attending Dr. Pollie Meyer -Vitals per routine -Non-weightbearing -Will obtain MRI of right foot -Continue Zosyn (s/p 1 dose in the ED) -Add Vanc per pharmacy -Oxycodone 5mg  q6h prn for pain control (patient  has Tylenol allergy, avoiding NSAIDs due to kidney function) -Will consult ortho pending MRI results -Blood cx pending -s/p fluid bolus in the ED -Recheck lactic acid  -am cbc,bmp -Will eventually need PT/OT  COPD Patient reportedly on 2-3L home O2 although states he uses it intermittently and does not have his oxygen with him today. He is breathing comfortably on room air with clear lungs. His SpO2 is 100% on room air although he will have nonsustained desats into the 80s, these seem to be associated with a poor read and may be inaccurate.  Home meds include Incruse Ellipta 62. daily and albuterol PRN -Continue home Incruse -Holding albuterol due to prolonged QT. Can re-evaluate need for albuterol after repeat EKG -Continuous pulse ox, supplemental O2 as needed -Goal SpO2 >88%  HFmrEF Chronic, not in acute exacerbation. Appears euvolemic on exam. Lungs clear. Last echo 09/26/20 showed EF 40 to 45% with G2DD (pseudonormalization) Home meds include Lasix 40mg  daily, spironolactone 25mg  daily, Imdur 30mg  daily, and Coreg 3.125mg  BID -Continue home meds  -Monitor volume status  HTN Patient's BP elevated to 140s-150s systolic and 90s-100s diastolic. Likely multifactorial secondary to suspected noncompliance with home medications as well as pain. Home meds include lisinopril 20mg  BID, clonidine 0.1mg  TID, and hydralazine 50mg  TID.  - Continue Lisinopril, Clonidine, and Hydralazine - Monitor BP  Prolonged QTc EKG demonstrates sinus tachycardia at 108 with QTc 501. Corrected QTc 483. -Avoid QT prolonging medications -Repeat EKG - Goal of magnesium 2.0, potassium 4.0.  History of CAD: Per documentation in November 2021 the patient was started on Plavix due to the extent of his CAD, the fact that he was  a poor candidate for invasive ischemic evaluation, and the fact that his previous coronary CTA in 5/21 showed moderate-severe multivessel CAD. -Continue Plavix -Continue  statin  T2DM Patient with a hx of T2DM, not on any medications at home. Last A1c was 5.9% on 09/09/2020. CBG in the ED today 100. -Daily BMP -Consider SSI if glucose becomes significantly elevated  Polysubstance abuse Patient with a history of cocaine and alcohol use. Reports last use (both alcohol and cocaine) was Wednesday 12/05/20. UDS and alcohol levels were not obtained. -Monitor for withdrawal -TOC consult  Schizoaffective disorder Home meds include Depakote ER 500mg  BID although patient states he hasn't taken it recently.  Patient also has trazodone as a home medication which it appears per chart review he takes as needed at night. -We will restart patient's Depakote. -After discussion with pharmacy we will consider restarting trazodone once patient's QTC has improved.  We will hold it for the time being.  Hyperlipidemia: Home medications include atorvastatin 20 mg -Continue home atorvastatin  Homelessness Patient currently homeless. -Social work consult  FEN/GI: Regular diet, 2L fluid restriction Prophylaxis: Lovenox  Disposition: med-surg  History of Present Illness:  [name] is a 55 y.o. male presenting with increasing pain of R foot with associated ulceration. Patient was seen in the ED on 1/6 with a 6 day history of bilateral foot pain and ulcer of the R foot. He was discharged with Toradol for pain control and presents today with worsening of his pain.  States that since he was seen his ulcer has become more painful after a large callus came off. More swollen over the last week or week and a half.   No fever, chills, redness. He is still able to ambulate, but also uses a wheelchair as needed.    Review Of Systems: Per HPI with the following additions:  Review of Systems  Constitutional: Negative for fever.  Respiratory: Positive for shortness of breath.   Cardiovascular: Negative for chest pain.  Gastrointestinal: Negative for abdominal pain.   Genitourinary: Negative for dysuria.  Musculoskeletal: Positive for joint swelling.  Skin: Positive for wound.     Patient Active Problem List   Diagnosis Date Noted  . Diabetic foot ulcer (HCC) 12/08/2020  . CHF (congestive heart failure) (HCC) 11/14/2020  . CHF exacerbation (HCC) 11/13/2020  . Hypokalemia   . Hyperlipidemia   . Acute respiratory failure with hypoxia (HCC) 10/26/2020  . COPD with acute exacerbation (HCC) 10/14/2020  . Acute on chronic combined systolic and diastolic CHF (congestive heart failure) (HCC) 10/02/2020  . Near syncope 07/21/2020  . Schizoaffective disorder (HCC) 06/24/2019  . SOB (shortness of breath)   . QT prolongation 05/25/2019  . Cocaine abuse (HCC)   . Elevated troponin   . Homelessness   . Suicidal ideation   . Bilateral foot pain 04/05/2019  . Housing situation unstable 03/28/2019  . Bilateral lower extremity edema   . Asthma 12/26/2016  . Tobacco use disorder 12/26/2016  . Schizophrenia (HCC) 12/26/2016  . Cocaine abuse with cocaine-induced mood disorder (HCC) 09/06/2016  . Alcohol use disorder, mild, abuse 07/31/2016  . Substance induced mood disorder (HCC) 07/30/2016  . Schizoaffective disorder, bipolar type (HCC) 05/31/2016  . Cocaine use disorder, severe, dependence (HCC) 05/31/2016  . Essential hypertension 05/31/2016    Past Medical History: Past Medical History:  Diagnosis Date  . Asthma   . Bipolar 1 disorder (HCC)   . CHF (congestive heart failure) (HCC)   . COPD (chronic obstructive pulmonary disease) (HCC)   .  Depression   . Diabetes mellitus without complication (HCC)   . GERD (gastroesophageal reflux disease)   . Homelessness   . Hypertension   . QT prolongation   . Schizoaffective disorder [facility])     Past Surgical History: No past surgical history on file.  Social History: Social History   Tobacco Use  . Smoking status: Current Every Day Smoker    Years: 30.00    Types: Cigarettes  . Smokeless tobacco:  Never Used  Vaping Use  . Vaping Use: Never used  Substance Use Topics  . Alcohol use: Yes    Comment: infrequently  . Drug use: Yes    Types: Cocaine, Marijuana    Family History: Family History  Problem Relation Age of Onset  . Drug abuse Mother   . Suicidality Mother     Allergies and Medications: Allergies  Allergen Reactions  . Ibuprofen Anaphylaxis  . Acetaminophen Hives, Nausea And Vomiting and Rash    Pt overdosed on bottle of APAP 05/06/16    No current facility-administered medications on file prior to encounter.   Current Outpatient Medications on File Prior to Encounter  Medication Sig Dispense Refill  . albuterol (VENTOLIN HFA) 108 (90 Base) MCG/ACT inhaler Inhale 2 puffs into the lungs every 6 (six) hours as needed for wheezing or shortness of breath. 8 g 2  . atorvastatin (LIPITOR) 20 MG tablet Take 1 tablet (20 mg total) by mouth daily. 30 tablet 2  . carvedilol (COREG) 3.125 MG tablet Take 1 tablet (3.125 mg total) by mouth 2 (two) times daily with a meal. 60 tablet 0  . cloNIDine (CATAPRES) 0.1 MG tablet Take 1 tablet (0.1 mg total) by mouth 3 (three) times daily. 90 tablet 0  . clopidogrel (PLAVIX) 75 MG tablet Take 1 tablet (75 mg total) by mouth daily. 30 tablet 2  . divalproex (DEPAKOTE ER) 500 MG 24 hr tablet Take 1 tablet (500 mg total) by mouth in the morning and at bedtime. 60 tablet 2  . furosemide (LASIX) 40 MG tablet Take 1 tablet (40 mg total) by mouth daily. 30 tablet 2  . gabapentin (NEURONTIN) 100 MG capsule Take 2 capsules (200 mg total) by mouth 2 (two) times daily. 60 capsule 2  . hydrALAZINE (APRESOLINE) 50 MG tablet Take 1 tablet (50 mg total) by mouth every 8 (eight) hours. 90 tablet 2  . isosorbide mononitrate (IMDUR) 30 MG 24 hr tablet Take 1 tablet (30 mg total) by mouth daily. 30 tablet 2  . lisinopril (ZESTRIL) 20 MG tablet Take 1 tablet (20 mg total) by mouth in the morning and at bedtime. 30 tablet 2  . neomycin-bacitracin-polymyxin  (NEOSPORIN) ointment Apply 1 application topically 2 (two) times daily. 60 g 0  . senna-docusate (SENOKOT-S) 8.6-50 MG tablet Take 1 tablet by mouth at bedtime as needed for mild constipation. 30 tablet 0  . spironolactone (ALDACTONE) 25 MG tablet Take 0.5 tablets (12.5 mg total) by mouth daily. 15 tablet 2  . traZODone (DESYREL) 50 MG tablet Take 1 tablet (50 mg total) by mouth at bedtime. 30 tablet 2  . umeclidinium bromide (INCRUSE ELLIPTA) 62.5 MCG/INH AEPB Inhale 1 puff into the lungs daily. 30 each 2    Objective: BP (!) 146/95 (BP Location: Left Arm)   Pulse 99   Temp 98.1 F (36.7 C) (Oral)   Resp (!) 22   SpO2 100%  Exam: General: alert, NAD Eyes: PERRL ENTM: moist mucous membranes Neck: supple, normal ROM Cardiovascular: RRR, normal S1/S2,  II/VI systolic murmur Respiratory: normal WOB, lungs CTAB Gastrointestinal: +BS, soft, nontender, nondistended Extremities: distal pulses intact, nonpitting edema of R foot with skin changes as below. No increased warmth or streaking redness.   L foot:  Neuro: alert, oriented x3, CN II-XII intact Psych: appropriate affect, cooperative with history and exam  Labs and Imaging: CBC BMET  Recent Labs  Lab 12/08/20 0040  WBC 6.9  HGB 13.3  HCT 38.7*  PLT 328   Recent Labs  Lab 12/08/20 0040  NA 137  K 4.1  CL 103  CO2 21*  BUN 17  CREATININE 1.39*  GLUCOSE 85  CALCIUM 8.4*    Lactic acid: 2.2  EKG: sinus tachycardia at 108, borderline prolonged QTc at 483  DG Foot Complete Right Result Date: 12/08/2020 FINDINGS: There is no evidence of fracture or dislocation. There is no evidence of arthropathy or other focal bone abnormality. No evidence of cortical destruction. Mild soft tissue irregularity about the medial and inferior aspect of the great toe near the interphalangeal joint, small focal radiopacity within the medial soft tissues. IMPRESSION: Slight interval decreased size of previously visualized right infra medial  great toe soft tissue ulceration, now with focal radiopaque foreign body in the medial aspect. No radiographic evidence of osteomyelitis. Electronically Signed   By: Marliss Coots MD   On: 12/08/2020 09:45    Maury Dus, MD 12/08/2020, 3:48 PM PGY-1, [facility] Family Medicine FPTS Intern pager: [phone], text pages welcome  Upper Level Addendum:  I have seen and evaluated this patient along with Dr. Anner Crete and reviewed the above note, making necessary revisions in green.   Jackelyn Poling, DO 12/08/2020, 5:22 PM PGY-2, [place] [facility]

## 2020-12-08 NOTE — ED Triage Notes (Signed)
Pt here for eval of stage 4 ulcer on R foot, just under great toe, cellulitis noted to both feet. Denies injury, compliant w medication. Refused physical assessment/tx from EMS  Hx diabetes 158/108 CBG 132

## 2020-12-08 NOTE — ED Notes (Signed)
Pt requesting contact Alac Jesser [phone]

## 2020-12-08 NOTE — Progress Notes (Addendum)
Pharmacy Antibiotic Note  [name] is a 55 y.o. male admitted on 12/07/2020 with right great toe infection.  S/p zosyn x1 dose in ED.  Pharmacy has been consulted for Vancomycin and zosyn dosing. WBC wnl, afebrile, Cr 1.4 crcl 17ml/min  Plan: Vancomycin 1250mg  q12hr  Zosyn 3.375gm IV EI   Height: 5\' 9"  (175.3 cm) Weight: 91.1 kg (200 lb 13.4 oz) IBW/kg (Calculated) : 70.7  Temp (24hrs), Avg:97.8 F (36.6 C), Min:97.3 F (36.3 C), Max:98.1 F (36.7 C)  Recent Labs  Lab 12/05/20 1454 12/08/20 0040 12/08/20 1004  WBC 7.2 6.9  --   CREATININE 1.35* 1.39*  --   LATICACIDVEN  --  1.6 2.2*    Estimated Creatinine Clearance: 69.4 mL/min (A) (by C-G formula based on SCr of 1.39 mg/dL (H)).    Allergies  Allergen Reactions  . Ibuprofen Anaphylaxis  . Acetaminophen Hives, Nausea And Vomiting and Rash    Pt overdosed on bottle of APAP 05/06/16     Antimicrobials this admission: Zosyn 1/8 > vanc 1/8>  Dose adjustments this admission:   Microbiology results:     3/8 Pharm.D. CPP, BCPS Clinical Pharmacist [phone] 12/08/2020 5:09 PM

## 2020-12-08 NOTE — Progress Notes (Signed)
Patient arrived from ED via stretcher, alert and oriented and irritated. Patient does not want anyone/ staff in his room. RN explained to patient vitals need to be checked prior to leaving his room. Patient allowed vitals to be checked but no assessment was allowed. RN made sure bed is in lowest position,call bell within reach and telephone also at bedside.

## 2020-12-08 NOTE — ED Notes (Signed)
Pt uncooperative, getting loud with nurse during patient care. Pt requesting to talk to charge, charge RN notified.

## 2020-12-08 NOTE — ED Notes (Signed)
Pt refused vitals 

## 2020-12-08 NOTE — ED Provider Notes (Signed)
[facility] EMERGENCY DEPARTMENT Provider Note   CSN: [id number] Arrival date & time: 12/07/20  2354     History Chief Complaint  Patient presents with  . Foot Pain    [name] is a 55 y.o. male.  55 year old male with past medical history of diabetes, bipolar disorder, CHF, hypertension and homelessness, additional history as listed below presents with worsening right foot pain, swelling, wound.  Patient states that he had a large callus in the area which fell off and revealed the wound to his right great MTP plantar surface of the foot.  Patient was seen in the ED 2 days ago for same, states area has become more swollen and painful since that time.  Patient is generally a poor historian.  No other complaints or concerns.        Past Medical History:  Diagnosis Date  . Asthma   . Bipolar 1 disorder (HCC)   . CHF (congestive heart failure) (HCC)   . COPD (chronic obstructive pulmonary disease) (HCC)   . Depression   . Diabetes mellitus without complication (HCC)   . GERD (gastroesophageal reflux disease)   . Homelessness   . Hypertension   . QT prolongation   . Schizoaffective disorder [facility])     Patient Active Problem List   Diagnosis Date Noted  . Diabetic foot ulcer (HCC) 12/08/2020  . CHF (congestive heart failure) (HCC) 11/14/2020  . CHF exacerbation (HCC) 11/13/2020  . Hypokalemia   . Hyperlipidemia   . Acute respiratory failure with hypoxia (HCC) 10/26/2020  . COPD with acute exacerbation (HCC) 10/14/2020  . Acute on chronic combined systolic and diastolic CHF (congestive heart failure) (HCC) 10/02/2020  . Near syncope 07/21/2020  . Schizoaffective disorder (HCC) 06/24/2019  . SOB (shortness of breath)   . QT prolongation 05/25/2019  . Cocaine abuse (HCC)   . Elevated troponin   . Homelessness   . Suicidal ideation   . Bilateral foot pain 04/05/2019  . Housing situation unstable 03/28/2019  . Bilateral lower extremity edema   .  Asthma 12/26/2016  . Tobacco use disorder 12/26/2016  . Schizophrenia (HCC) 12/26/2016  . Cocaine abuse with cocaine-induced mood disorder (HCC) 09/06/2016  . Alcohol use disorder, mild, abuse 07/31/2016  . Substance induced mood disorder (HCC) 07/30/2016  . Schizoaffective disorder, bipolar type (HCC) 05/31/2016  . Cocaine use disorder, severe, dependence (HCC) 05/31/2016  . Essential hypertension 05/31/2016    No past surgical history on file.     Family History  Problem Relation Age of Onset  . Drug abuse Mother   . Suicidality Mother     Social History   Tobacco Use  . Smoking status: Current Every Day Smoker    Years: 30.00    Types: Cigarettes  . Smokeless tobacco: Never Used  Vaping Use  . Vaping Use: Never used  Substance Use Topics  . Alcohol use: Yes    Comment: infrequently  . Drug use: Yes    Types: Cocaine, Marijuana    Home Medications Prior to Admission medications   Medication Sig Start Date End Date Taking? Authorizing Provider  albuterol (VENTOLIN HFA) 108 (90 Base) MCG/ACT inhaler Inhale 2 puffs into the lungs every 6 (six) hours as needed for wheezing or shortness of breath. 11/16/20   Pokhrel, Rebekah Chesterfield, MD  atorvastatin (LIPITOR) 20 MG tablet Take 1 tablet (20 mg total) by mouth daily. 11/16/20   Pokhrel, Rebekah Chesterfield, MD  carvedilol (COREG) 3.125 MG tablet Take 1 tablet (3.125 mg total)  by mouth 2 (two) times daily with a meal. 11/16/20   Pokhrel, Laxman, MD  cloNIDine (CATAPRES) 0.1 MG tablet Take 1 tablet (0.1 mg total) by mouth 3 (three) times daily. 11/16/20   Pokhrel, Rebekah Chesterfield, MD  clopidogrel (PLAVIX) 75 MG tablet Take 1 tablet (75 mg total) by mouth daily. 11/16/20   Pokhrel, Rebekah Chesterfield, MD  divalproex (DEPAKOTE ER) 500 MG 24 hr tablet Take 1 tablet (500 mg total) by mouth in the morning and at bedtime. 11/16/20   Pokhrel, Rebekah Chesterfield, MD  furosemide (LASIX) 40 MG tablet Take 1 tablet (40 mg total) by mouth daily. 11/16/20   Pokhrel, Rebekah Chesterfield, MD  gabapentin  (NEURONTIN) 100 MG capsule Take 2 capsules (200 mg total) by mouth 2 (two) times daily. 11/16/20   Pokhrel, Rebekah Chesterfield, MD  hydrALAZINE (APRESOLINE) 50 MG tablet Take 1 tablet (50 mg total) by mouth every 8 (eight) hours. 11/16/20   Pokhrel, Rebekah Chesterfield, MD  isosorbide mononitrate (IMDUR) 30 MG 24 hr tablet Take 1 tablet (30 mg total) by mouth daily. 11/16/20 12/16/20  Pokhrel, Rebekah Chesterfield, MD  lisinopril (ZESTRIL) 20 MG tablet Take 1 tablet (20 mg total) by mouth in the morning and at bedtime. 11/16/20   Pokhrel, Rebekah Chesterfield, MD  neomycin-bacitracin-polymyxin (NEOSPORIN) ointment Apply 1 application topically 2 (two) times daily. 11/16/20   Pokhrel, Rebekah Chesterfield, MD  senna-docusate (SENOKOT-S) 8.6-50 MG tablet Take 1 tablet by mouth at bedtime as needed for mild constipation. 10/07/20   Marguerita Merles Latif, DO  spironolactone (ALDACTONE) 25 MG tablet Take 0.5 tablets (12.5 mg total) by mouth daily. 11/16/20 12/16/20  Pokhrel, Rebekah Chesterfield, MD  traZODone (DESYREL) 50 MG tablet Take 1 tablet (50 mg total) by mouth at bedtime. 11/16/20   Pokhrel, Rebekah Chesterfield, MD  umeclidinium bromide (INCRUSE ELLIPTA) 62.5 MCG/INH AEPB Inhale 1 puff into the lungs daily. 11/16/20   Pokhrel, Rebekah Chesterfield, MD    Allergies    Ibuprofen and Acetaminophen  Review of Systems   Review of Systems  Constitutional: Negative for fever.  Gastrointestinal: Negative for nausea and vomiting.  Musculoskeletal: Positive for arthralgias, gait problem and joint swelling.  Skin: Positive for wound.  Allergic/Immunologic: Positive for immunocompromised state.  Neurological: Negative for weakness and numbness.  Hematological: Negative for adenopathy. Does not bruise/bleed easily.  All other systems reviewed and are negative.   Physical Exam Updated Vital Signs BP (!) 128/103   Pulse 97   Temp 98.1 F (36.7 C) (Oral)   Resp (!) 22   SpO2 97%   Physical Exam Vitals and nursing note reviewed.  Constitutional:      General: He is not in acute distress.     Appearance: He is well-developed and well-nourished. He is not diaphoretic.  HENT:     Head: Normocephalic and atraumatic.  Cardiovascular:     Pulses: Normal pulses.  Pulmonary:     Effort: Pulmonary effort is normal.  Musculoskeletal:        General: Swelling and tenderness present.     Comments: Wound to plantar surface of right foot at first MTP.  No active bleeding or drainage, swelling with significant tenderness to the foot, pulses present, exam generally not well tolerated by patient.  Skin:    General: Skin is warm and dry.  Neurological:     Mental Status: He is alert and oriented to person, place, and time.     Sensory: No sensory deficit.     Motor: No weakness.  Psychiatric:        Mood and Affect: Mood and affect  normal.        Behavior: Behavior normal.     ED Results / Procedures / Treatments   Labs (all labs ordered are listed, but only abnormal results are displayed) Labs Reviewed  BASIC METABOLIC PANEL - Abnormal; Notable for the following components:      Result Value   CO2 21 (*)    Creatinine, Ser 1.39 (*)    Calcium 8.4 (*)    All other components within normal limits  CBC - Abnormal; Notable for the following components:   RBC 3.92 (*)    HCT 38.7 (*)    All other components within normal limits  LACTIC ACID, PLASMA - Abnormal; Notable for the following components:   Lactic Acid, Venous 2.2 (*)    All other components within normal limits  URINALYSIS, ROUTINE W REFLEX MICROSCOPIC - Abnormal; Notable for the following components:   Color, Urine AMBER (*)    Specific Gravity, Urine 1.033 (*)    Protein, ur 100 (*)    Bacteria, UA RARE (*)    All other components within normal limits  CBG MONITORING, ED - Abnormal; Notable for the following components:   Glucose-Capillary 100 (*)    All other components within normal limits  CULTURE, BLOOD (ROUTINE X 2)  CULTURE, BLOOD (ROUTINE X 2)  RESP PANEL BY RT-PCR (FLU A&B, COVID) ARPGX2  LACTIC ACID,  PLASMA  CBG MONITORING, ED    EKG None  Radiology DG Foot Complete Right  Result Date: 12/08/2020 CLINICAL DATA:  55 year old male presenting with diabetic foot ulcer. EXAM: RIGHT FOOT COMPLETE - 3+ VIEW COMPARISON:  11/27/2020 FINDINGS: There is no evidence of fracture or dislocation. There is no evidence of arthropathy or other focal bone abnormality. No evidence of cortical destruction. Mild soft tissue irregularity about the medial and inferior aspect of the great toe near the interphalangeal joint, small focal radiopacity within the medial soft tissues. IMPRESSION: Slight interval decreased size of previously visualized right infra medial great toe soft tissue ulceration, now with focal radiopaque foreign body in the medial aspect. No radiographic evidence of osteomyelitis. Electronically Signed   By: Marliss Coots MD   On: 12/08/2020 09:45    Procedures Procedures (including critical care time)  Medications Ordered in ED Medications  piperacillin-tazobactam (ZOSYN) IVPB 3.375 g (has no administration in time range)  ondansetron (ZOFRAN) injection 4 mg (has no administration in time range)  sodium chloride 0.9 % bolus 500 mL (has no administration in time range)  HYDROmorphone (DILAUDID) injection 1 mg (1 mg Intramuscular Given 12/08/20 1215)    ED Course  I have reviewed the triage vital signs and the nursing notes.  Pertinent labs & imaging results that were available during my care of the patient were reviewed by me and considered in my medical decision making (see chart for details).  Clinical Course as of 12/08/20 1216  Sat Dec 08, 2020  1679 55 year old diabetic male with ulcerated wound to plantar surface of right foot.  Wound appears to have progressed from ED visit 2 days ago now with foot swelling (mild), and tenderness.  CBC with normal white blood cell count however lactic acid is increasing, currently 2.2.  Cautious IV fluids ordered as patient has history of CHF,  antibiotics ordered.  Case discussed with Dr. Rush Landmark, ED attending, plan is to admit to hospital service for infected diabetic foot. [LM]  1119 Case discussed with internal medicine teaching service who will consult for admission. [LM]    Clinical Course  User Index [LM] Alden Hipp   MDM Rules/Calculators/A&P                          Final Clinical Impression(s) / ED Diagnoses Final diagnoses:  Diabetic foot infection (HCC)  Lactic acidosis    Rx / DC Orders ED Discharge Orders    None       Jeannie Fend, PA-C 12/08/20 1216    Tegeler, Canary Brim, MD 12/08/20 1224

## 2020-12-09 ENCOUNTER — Other Ambulatory Visit: Payer: Self-pay

## 2020-12-09 ENCOUNTER — Encounter (HOSPITAL_COMMUNITY): Payer: Self-pay | Admitting: Family Medicine

## 2020-12-09 DIAGNOSIS — E11621 Type 2 diabetes mellitus with foot ulcer: Secondary | ICD-10-CM | POA: Diagnosis not present

## 2020-12-09 DIAGNOSIS — L97519 Non-pressure chronic ulcer of other part of right foot with unspecified severity: Secondary | ICD-10-CM | POA: Diagnosis not present

## 2020-12-09 DIAGNOSIS — Z5900 Homelessness unspecified: Secondary | ICD-10-CM | POA: Diagnosis not present

## 2020-12-09 DIAGNOSIS — I10 Essential (primary) hypertension: Secondary | ICD-10-CM | POA: Diagnosis not present

## 2020-12-09 LAB — BASIC METABOLIC PANEL
Anion gap: 9 (ref 5–15)
BUN: 18 mg/dL (ref 6–20)
CO2: 21 mmol/L — ABNORMAL LOW (ref 22–32)
Calcium: 8.1 mg/dL — ABNORMAL LOW (ref 8.9–10.3)
Chloride: 106 mmol/L (ref 98–111)
Creatinine, Ser: 1.8 mg/dL — ABNORMAL HIGH (ref 0.61–1.24)
GFR, Estimated: 45 mL/min — ABNORMAL LOW (ref >60)
Glucose, Bld: 76 mg/dL (ref 70–99)
Potassium: 4.1 mmol/L (ref 3.5–5.1)
Sodium: 136 mmol/L (ref 135–145)

## 2020-12-09 LAB — LACTIC ACID, PLASMA: Lactic Acid, Venous: 2.2 mmol/L (ref 0.5–1.9)

## 2020-12-09 LAB — CBC
HCT: 37 % — ABNORMAL LOW (ref 39.0–52.0)
Hemoglobin: 11.9 g/dL — ABNORMAL LOW (ref 13.0–17.0)
MCH: 32.3 pg (ref 26.0–34.0)
MCHC: 32.2 g/dL (ref 30.0–36.0)
MCV: 100.5 fL — ABNORMAL HIGH (ref 80.0–100.0)
Platelets: 279 10*3/uL (ref 150–400)
RBC: 3.68 MIL/uL — ABNORMAL LOW (ref 4.22–5.81)
RDW: 15.6 % — ABNORMAL HIGH (ref 11.5–15.5)
WBC: 9.1 10*3/uL (ref 4.0–10.5)
nRBC: 0 % (ref 0.0–0.2)

## 2020-12-09 MED ORDER — MUPIROCIN CALCIUM 2 % EX CREA
TOPICAL_CREAM | Freq: Every day | CUTANEOUS | Status: DC
  Filled 2020-12-09: qty 15

## 2020-12-09 MED ORDER — MUPIROCIN 2 % EX OINT
TOPICAL_OINTMENT | CUTANEOUS | Status: AC
  Filled 2020-12-09: qty 22

## 2020-12-09 MED ORDER — OXYCODONE HCL 5 MG PO TABS
5.0000 mg | ORAL_TABLET | ORAL | Status: DC | PRN
  Administered 2020-12-09 – 2020-12-10 (×7): 5 mg via ORAL
  Filled 2020-12-09 (×7): qty 1

## 2020-12-09 MED ORDER — DOXYCYCLINE HYCLATE 100 MG PO TABS
100.0000 mg | ORAL_TABLET | Freq: Two times a day (BID) | ORAL | Status: DC
  Administered 2020-12-09 – 2020-12-10 (×2): 100 mg via ORAL
  Filled 2020-12-09 (×3): qty 1

## 2020-12-09 MED ORDER — ALBUTEROL SULFATE HFA 108 (90 BASE) MCG/ACT IN AERS
1.0000 | INHALATION_SPRAY | RESPIRATORY_TRACT | Status: DC | PRN
  Filled 2020-12-09: qty 6.7

## 2020-12-09 MED ORDER — PREDNISONE 10 MG PO TABS
10.0000 mg | ORAL_TABLET | Freq: Every day | ORAL | Status: DC
  Administered 2020-12-09 – 2020-12-10 (×2): 10 mg via ORAL
  Filled 2020-12-09 (×2): qty 1

## 2020-12-09 MED ORDER — ATORVASTATIN CALCIUM 40 MG PO TABS
40.0000 mg | ORAL_TABLET | Freq: Every day | ORAL | Status: DC
  Administered 2020-12-10: 40 mg via ORAL
  Filled 2020-12-09: qty 1

## 2020-12-09 NOTE — Progress Notes (Signed)
PT Cancellation Note  Patient Details Name: [name] MRN: [id number] DOB: [birth year]   Cancelled Treatment:    Reason Eval/Treat Not Completed: Patient declined, no reason specified. Pt declining participation in therapy. He has an extensive history of being belligerent to staff and refusing services/care. Pt states "I don't need you for anything." Confirms that he has a wheelchair and still uses it for mobility. He does not walk due to bilat foot pain. States he is able to get in/out of wheelchair independently. Pt refusing further therapy attempts. PT signing off.   Ilda Foil 12/09/2020, 11:37 AM   Aida Raider, PT  Office # [phone] Pager [phone]

## 2020-12-09 NOTE — Consult Note (Addendum)
WOC Nurse Consult Note: Reason for Consult: Patient consult received for bilateral feet, cracked full thickness wounds are noted and illustrated in the photo provided by Provider in the ED. Seen by my partner, D. Sylvie Farrier last month for these issues. See note from 11/15/20. Wound type: Moisture associated skin damage with suspected fungal or other organism overgrowth. Pressure Injury POA: Yes/No/NA Measurement:3 x 4cm x approximately 0.2cm Wound bed: difficult to view wound bed, red. Drainage (amount, consistency, odor) scant Periwound:dry, cracked Dressing procedure/placement/frequency: Nursing will be provided with guidance to wash with soap and water, rinse and gently dry the patient's feet and to apply a thin layer of Bactroban to the bilateral great toes, plantar aspect, at the site of fissures/cracks, then cover with foam dressings.  Bactroban is to be applied daily, foam dressings may be reused and changed every other day or changed PRN soiling. If the patient or nurse find that gauze is more appropriate than silicone foam dressings to these areas, gauze is an acceptable alternative.  Secure Chat message sent to Dr. Anner Crete with recommendation that Podiatry be consulted for the care of these wounds as this is outside the scope of WOC Nursing.  WOC nursing team will not follow, but will remain available to this patient, the nursing and medical teams.  Please re-consult if needed. Thanks, Ladona Mow, MSN, RN, GNP, [place]  Pager# [phone]

## 2020-12-09 NOTE — Progress Notes (Addendum)
Family Medicine Teaching Service Daily Progress Note Intern Pager: [phone]  Patient name: [name] Medical record number: [id number] Date of birth: October 13, 1966 Age: 55 y.o. Gender: male  Primary Care Provider: Milas [name], [facility] Consultants: ortho, CSW Code Status: full  Pt Overview and Major Events to Date:  1/8- admitted  Assessment and Plan: [name] is a 55 y.o. male presenting with worsening pain associated with a R diabetic foot ulcer. PMH is significant for DM2, Schizoaffective disorder (bipolar type), COPD, polysubstance abuse, CHF, HTN, and homelessness.  R Great Toe Ulcer- MRI neg for osteomyelitis. Pain seems to be improved today. He would like a dressing over the wound and increased frequency of his pain medication which adequately treats the pain when it is current. Exam today is improved from images in chart from admission. Erythema is resolved. Would transition to PO antibiotics today. Bcx neg <24hrs, LA stable -Vitals per routine -dc IV Abx, begin doxy -Oxycodone 5mg  q4h prn for pain control - ambulatory podiatry consult for OP follow up - place dressing  COPD- chronic, stable. Patient denies respiratory complaints today. -Continue home Incruse -albuterol PRN -Continuous pulse ox, supplemental O2 as needed -Goal SpO2 >88%  HFrEF  HTN  h/o CAD  h/o prolonged QTc - patient had one episode of hypotension this am 80/43. Asymptomatic. If remains low, will hold some of his MANY BP medications. Suspicion that he may not need this many and more were added on over the years for non-adherence.  -Continue home meds, atorvastatin 20mg , carvedilol 3.125mg  BID, lasix 40mg  daily, clonidine 0.1mg  TID, hydralazine 50mg  TID, lisinopril 20mg  BID, spironolactone 12.5mg  daily, imdur 30mg  daily - plavix- according to chart review this is chronic due to ASA intolerance. Need to clarify with patient if he has stents. None recorded in chart - increasing his atorvastatin  dose to 40mg  daily -Monitor volume status  AKI-  Cr 1.8 today, possibly related to antibiotic use.  - BMP am  Macrocytic anemia- possibly related to malnutrition, part dilutional from fluids with IV Abxs - CBC, B12 am  T2DM- chronic, diet controlled. BS 76 today -monitor with daily labs  Polysubstance abuse  homelessness  -Monitor for withdrawal -TOC consult  Schizoaffective disorder Home meds include Depakote ER 500mg  BID although patient states he hasn't taken it recently.  Patient also has trazodone as a home medication which it appears per chart review he takes as needed at night. -restarted Depakote. -consider restarting trazodone once patient's QTC has improved. Held for now  FEN/GI: Regular diet, 2L fluid restriction Prophylaxis: Lovenox  Disposition: med-surg Status is: Observation  The patient remains OBS appropriate and will d/c before 2 midnights.  Dispo: The patient is from: Home              Anticipated d/c is to: Home              Anticipated d/c date is: 1 day              Patient currently is not medically stable to d/c.   Subjective:  Patient states that his pain is improved and well controlled with the oxycodone but hurts terribly when the medication runs out so requests the frequency increases. He would like to discharge tomorrow so he has time to make calls and arrange living arrangements and have social work assistance with housing needs while here. He requests a dressing for his foot as well as his albuterol inhaler. Otherwise, he is doing well and has no other concerns or  questions. He's very appreciative for the care he is receiving here.  Objective: Temp:  [97.6 F (36.4 C)-98.1 F (36.7 C)] 97.6 F (36.4 C) (01/08 2123) Pulse Rate:  [90-103] 90 (01/08 2123) Resp:  [18-25] 18 (01/08 2123) BP: (107-164)/(75-138) 107/75 (01/08 2123) SpO2:  [83 %-100 %] 95 % (01/08 2123) Weight:  [91.1 kg] 91.1 kg (01/08 1604) Physical Exam: General: NAD,  laying in bed alert Cardiovascular: RRR, no murmur appreciated Respiratory: no increased WOB Extremities: inferior R great toe has ulceration which is not tender to palpation, non-erythematous. Dry and without drainage. Surrounded by callous. Sensation is decreased bilaterally to light touch. No swelling.  Laboratory: Recent Labs  Lab 12/05/20 1454 12/08/20 0040 12/09/20 0402  WBC 7.2 6.9 9.1  HGB 14.4 13.3 11.9*  HCT 42.9 38.7* 37.0*  PLT 403* 328 279   Recent Labs  Lab 12/05/20 1454 12/08/20 0040 12/09/20 0402  NA 139 137 136  K 4.2 4.1 4.1  CL 105 103 106  CO2 20* 21* 21*  BUN 12 17 18   CREATININE 1.35* 1.39* 1.80*  CALCIUM 8.7* 8.4* 8.1*  PROT 6.7  --   --   BILITOT 1.2  --   --   ALKPHOS 74  --   --   ALT 23  --   --   AST 46*  --   --   GLUCOSE 71 85 76   LA 2.2>2.2  Imaging/Diagnostic Tests: MR FOOT RIGHT W WO CONTRAST  Result Date: 12/08/2020 CLINICAL DATA:  Right great toe ulcer. EXAM: MRI OF THE RIGHT FOREFOOT WITHOUT AND WITH CONTRAST TECHNIQUE: Multiplanar, multisequence MR imaging of the right forefoot was performed before and after the administration of intravenous contrast. CONTRAST:  9.39mL GADAVIST GADOBUTROL 1 MMOL/ML IV SOLN COMPARISON:  Right foot x-rays from same day. MRI right forefoot dated November 13, 2020. FINDINGS: Despite efforts by the technologist and patient, motion artifact is present on today's exam and could not be eliminated. This reduces exam sensitivity and specificity. Bones/Joint/Cartilage No suspicious marrow signal abnormality. No fracture or dislocation. Unchanged mild first MTP joint osteoarthritis. No joint effusion. Ligaments Collateral ligaments are intact. Muscles and Tendons Flexor and extensor tendons are intact. Increased T2 signal within the intrinsic muscles of the forefoot, nonspecific, but likely related to diabetic muscle changes. Soft tissue Progressive skin ulceration of the medial plantar great toe at the level of the  first IP joint. Mild dorsal forefoot soft tissue swelling. No fluid collection or hematoma. No soft tissue mass. IMPRESSION: 1. Progressive skin ulceration of the medial plantar great toe at the level of the first IP joint. No abscess or evidence of osteomyelitis. Electronically Signed   By: November 15, 2020 M.D.   On: 12/08/2020 17:22   DG Foot Complete Right  Result Date: 12/08/2020 CLINICAL DATA:  55 year old male presenting with diabetic foot ulcer. EXAM: RIGHT FOOT COMPLETE - 3+ VIEW COMPARISON:  11/27/2020 FINDINGS: There is no evidence of fracture or dislocation. There is no evidence of arthropathy or other focal bone abnormality. No evidence of cortical destruction. Mild soft tissue irregularity about the medial and inferior aspect of the great toe near the interphalangeal joint, small focal radiopacity within the medial soft tissues. IMPRESSION: Slight interval decreased size of previously visualized right infra medial great toe soft tissue ulceration, now with focal radiopaque foreign body in the medial aspect. No radiographic evidence of osteomyelitis. Electronically Signed   By: 11/29/2020 MD   On: 12/08/2020 09:45    02/05/2021,  DO 12/09/2020, 7:11 AM PGY-3, [facility] FPTS Intern pager: [phone], text pages welcome

## 2020-12-09 NOTE — Progress Notes (Signed)
RN went in room to assess and dress patient's wound, Patient stated to RN " Put ointment on my toes and cover it with the gauze and tape and leave me alone. I do not want any socks or anything else on it. Just turn the light off and close my door" Dressing applied per patient's request and will continue to monitor.

## 2020-12-10 ENCOUNTER — Other Ambulatory Visit (HOSPITAL_COMMUNITY): Payer: Self-pay | Admitting: Family Medicine

## 2020-12-10 DIAGNOSIS — D539 Nutritional anemia, unspecified: Secondary | ICD-10-CM | POA: Diagnosis present

## 2020-12-10 DIAGNOSIS — Z23 Encounter for immunization: Secondary | ICD-10-CM | POA: Diagnosis not present

## 2020-12-10 DIAGNOSIS — N179 Acute kidney failure, unspecified: Secondary | ICD-10-CM | POA: Diagnosis present

## 2020-12-10 DIAGNOSIS — Z7902 Long term (current) use of antithrombotics/antiplatelets: Secondary | ICD-10-CM | POA: Diagnosis not present

## 2020-12-10 DIAGNOSIS — E785 Hyperlipidemia, unspecified: Secondary | ICD-10-CM | POA: Diagnosis present

## 2020-12-10 DIAGNOSIS — Z20822 Contact with and (suspected) exposure to covid-19: Secondary | ICD-10-CM | POA: Diagnosis present

## 2020-12-10 DIAGNOSIS — Z5900 Homelessness unspecified: Secondary | ICD-10-CM | POA: Diagnosis not present

## 2020-12-10 DIAGNOSIS — F250 Schizoaffective disorder, bipolar type: Secondary | ICD-10-CM | POA: Diagnosis present

## 2020-12-10 DIAGNOSIS — E11621 Type 2 diabetes mellitus with foot ulcer: Secondary | ICD-10-CM | POA: Diagnosis present

## 2020-12-10 DIAGNOSIS — I5042 Chronic combined systolic (congestive) and diastolic (congestive) heart failure: Secondary | ICD-10-CM | POA: Diagnosis present

## 2020-12-10 DIAGNOSIS — I110 Hypertensive heart disease with heart failure: Secondary | ICD-10-CM | POA: Diagnosis present

## 2020-12-10 DIAGNOSIS — E872 Acidosis: Secondary | ICD-10-CM | POA: Diagnosis present

## 2020-12-10 DIAGNOSIS — F17210 Nicotine dependence, cigarettes, uncomplicated: Secondary | ICD-10-CM | POA: Diagnosis present

## 2020-12-10 DIAGNOSIS — I2510 Atherosclerotic heart disease of native coronary artery without angina pectoris: Secondary | ICD-10-CM | POA: Diagnosis present

## 2020-12-10 DIAGNOSIS — Z79899 Other long term (current) drug therapy: Secondary | ICD-10-CM | POA: Diagnosis not present

## 2020-12-10 DIAGNOSIS — I959 Hypotension, unspecified: Secondary | ICD-10-CM | POA: Diagnosis not present

## 2020-12-10 DIAGNOSIS — K219 Gastro-esophageal reflux disease without esophagitis: Secondary | ICD-10-CM | POA: Diagnosis present

## 2020-12-10 DIAGNOSIS — L97519 Non-pressure chronic ulcer of other part of right foot with unspecified severity: Secondary | ICD-10-CM | POA: Diagnosis present

## 2020-12-10 DIAGNOSIS — L089 Local infection of the skin and subcutaneous tissue, unspecified: Secondary | ICD-10-CM | POA: Diagnosis not present

## 2020-12-10 DIAGNOSIS — E11628 Type 2 diabetes mellitus with other skin complications: Secondary | ICD-10-CM | POA: Diagnosis present

## 2020-12-10 DIAGNOSIS — J441 Chronic obstructive pulmonary disease with (acute) exacerbation: Secondary | ICD-10-CM | POA: Diagnosis present

## 2020-12-10 LAB — CBC
HCT: 36.4 % — ABNORMAL LOW (ref 39.0–52.0)
Hemoglobin: 11.7 g/dL — ABNORMAL LOW (ref 13.0–17.0)
MCH: 32.6 pg (ref 26.0–34.0)
MCHC: 32.1 g/dL (ref 30.0–36.0)
MCV: 101.4 fL — ABNORMAL HIGH (ref 80.0–100.0)
Platelets: 280 10*3/uL (ref 150–400)
RBC: 3.59 MIL/uL — ABNORMAL LOW (ref 4.22–5.81)
RDW: 15.6 % — ABNORMAL HIGH (ref 11.5–15.5)
WBC: 9.8 10*3/uL (ref 4.0–10.5)
nRBC: 0 % (ref 0.0–0.2)

## 2020-12-10 LAB — BASIC METABOLIC PANEL
Anion gap: 9 (ref 5–15)
BUN: 10 mg/dL (ref 6–20)
CO2: 21 mmol/L — ABNORMAL LOW (ref 22–32)
Calcium: 8.2 mg/dL — ABNORMAL LOW (ref 8.9–10.3)
Chloride: 107 mmol/L (ref 98–111)
Creatinine, Ser: 1.42 mg/dL — ABNORMAL HIGH (ref 0.61–1.24)
GFR, Estimated: 59 mL/min — ABNORMAL LOW (ref >60)
Glucose, Bld: 149 mg/dL — ABNORMAL HIGH (ref 70–99)
Potassium: 3.9 mmol/L (ref 3.5–5.1)
Sodium: 137 mmol/L (ref 135–145)

## 2020-12-10 LAB — VITAMIN B12: Vitamin B-12: 631 pg/mL (ref 180–914)

## 2020-12-10 MED ORDER — DOXYCYCLINE HYCLATE 100 MG PO TABS: 100.0000 mg | ORAL_TABLET | Freq: Two times a day (BID) | ORAL | 0 refills | Status: DC

## 2020-12-10 MED ORDER — PREDNISONE 10 MG PO TABS: 10.0000 mg | ORAL_TABLET | Freq: Every day | ORAL | 0 refills | Status: DC

## 2020-12-10 MED ORDER — ATORVASTATIN CALCIUM 40 MG PO TABS: 40.0000 mg | ORAL_TABLET | Freq: Every day | ORAL | 0 refills | Status: DC

## 2020-12-10 MED ORDER — COVID-19 MRNA VACCINE (PFIZER) 30 MCG/0.3ML IM SUSP
0.3000 mL | Freq: Once | INTRAMUSCULAR | Status: AC
  Administered 2020-12-10: 0.3 mL via INTRAMUSCULAR
  Filled 2020-12-10: qty 0.3

## 2020-12-10 MED ORDER — OXYCODONE HCL 5 MG PO TABS: 5.0000 mg | ORAL_TABLET | ORAL | 0 refills | Status: AC | PRN

## 2020-12-10 MED ORDER — LISINOPRIL 20 MG PO TABS: 20.0000 mg | ORAL_TABLET | Freq: Every day | ORAL | 0 refills | Status: DC

## 2020-12-10 MED FILL — predniSONE 10 MG TABS: 10 | 3 days supply | Qty: 3 | Fill #0

## 2020-12-10 MED FILL — ATORVASTATIN CALCIUM 40 MG: 40 | 30 days supply | Qty: 30 | Fill #0

## 2020-12-10 MED FILL — oxyCODONE HCL 5 MG TABS: 5 | 2 days supply | Qty: 12 | Fill #0

## 2020-12-10 MED FILL — LISINOPRIL 20 MG TABLET: 20 | 30 days supply | Qty: 30 | Fill #0

## 2020-12-10 MED FILL — DOXYCYCLINE HYCLATE 100 MG: 100 | 5 days supply | Qty: 11 | Fill #0

## 2020-12-10 NOTE — Hospital Course (Signed)
[name] presented to the [facility] on 12/09/19 with worsening pain associated with an ulcer of the inferomedial R Great Toe. He also had increasing swelling, erythema and pain of the foot. He was started on IV antibiotics and CT/MRI was negative for osteomylelitis. Pain control was achieved with PRN oxycodone and antibiotics were transitioned to PO doxycycline. His symptoms improved and he was deemed stable for discharge.   Items for PCP Follow-Up Patient is on quite a few anti-hypertensive agents, including clonidine, which does not offer cardio- or renal- protection. Consider whether all of these agents remain necessary.  Given his complex cardiac history, consider ambulatory cardiology referral

## 2020-12-10 NOTE — Discharge Summary (Addendum)
Family Medicine Teaching [facility] Discharge Summary  Patient name: [name] Medical record number: [id number] Date of birth: 03-13-1966 Age: 55 y.o. Gender: male Date of Admission: 12/07/2020  Date of Discharge: 12/10/2020 Admitting Physician: [name] Dodrill, MD  Primary Care Provider: [place], [facility] Health Consultants: None  Indication for Hospitalization: Right great toe ulcer  Discharge Diagnoses/Problem List:  Active Problems:   Hypertension   Homeless   Diabetic foot ulcer (HCC)  Disposition: Home  Discharge Condition: Stable  Discharge Exam:   General: Alert and oriented in no apparent distress Heart: Regular rate and rhythm with no murmurs appreciated Lungs: CTA bilaterally, breathing well on room air Abdomen: Bowel sounds present, no abdominal pain Skin: Warm and dry Extremities: Ulceration present on the inferior aspect of the right great toe which is currently wrapped in gauze.  Patient also has left great toe wrapped with no erythema extending from the bandage and no drainage through the bandage.    Brief Hospital Course:  [name] presented to the [facility] on 12/09/19 with worsening pain associated with an ulcer of the inferomedial R Great Toe. He also had increasing swelling, erythema and pain of the foot. He was started on IV antibiotics and XR/MRI was negative for osteomylelitis. Pain control was achieved with PRN oxycodone and antibiotics were transitioned to PO doxycycline. His symptoms improved and he was deemed stable for discharge.  At the time of discharge he was referred to podiatry.  The patient was discharged with a DME order for crutches.  The patient did wish to establish with our clinic and was set up for a follow-up appointment.  Also was treated with prednisone for mild COPD exacerbation, stable at time of discharge, will complete outpatient 5 day course.  Items for PCP Follow-Up Patient is on quite a few anti-hypertensive agents,  including clonidine, which does not offer cardio- or renal- protection. Consider whether all of these agents remain necessary.  Given his complex cardiac history, consider ambulatory cardiology referral Ensure patient follows up with outpatient podiatry-this order has been placed Recommend rechecking a BMP as the patient did have an AKI. Patient has a history of homelessness, consider social work assistance if they would be helpful for patient in getting to appointments/etc Received first dose of Pfizer COVID vaccine prior to discharge. Will be due for second dose on 12/31/20.  Significant Procedures: None  Significant Labs and Imaging:  Recent Labs  Lab 12/08/20 0040 12/09/20 0402 12/10/20 0638  WBC 6.9 9.1 9.8  HGB 13.3 11.9* 11.7*  HCT 38.7* 37.0* 36.4*  PLT 328 279 280   Recent Labs  Lab 12/05/20 1454 12/08/20 0040 12/09/20 0402 12/10/20 0638  NA 139 137 136 137  K 4.2 4.1 4.1 3.9  CL 105 103 106 107  CO2 20* 21* 21* 21*  GLUCOSE 71 85 76 149*  BUN 12 17 18 10   CREATININE 1.35* 1.39* 1.80* 1.42*  CALCIUM 8.7* 8.4* 8.1* 8.2*  ALKPHOS 74  --   --   --   AST 46*  --   --   --   ALT 23  --   --   --   ALBUMIN 3.2*  --   --   --       Results/Tests Pending at Time of Discharge: None  Discharge Medications:  Allergies as of 12/10/2020       Reactions   Ibuprofen Anaphylaxis   Acetaminophen Hives, Nausea And Vomiting, Rash   Pt overdosed on bottle of APAP 05/06/16  Medication List     TAKE these medications    albuterol 108 (90 Base) MCG/ACT inhaler Commonly known as: VENTOLIN HFA Inhale 2 puffs into the lungs every 6 (six) hours as needed for wheezing or shortness of breath.   atorvastatin 40 MG tablet Commonly known as: LIPITOR Take 1 tablet (40 mg total) by mouth daily. Start taking on: December 11, 2020 What changed:  medication strength how much to take   carvedilol 3.125 MG tablet Commonly known as: COREG Take 1 tablet (3.125 mg total) by  mouth 2 (two) times daily with a meal.   cloNIDine 0.1 MG tablet Commonly known as: CATAPRES Take 1 tablet (0.1 mg total) by mouth 3 (three) times daily.   clopidogrel 75 MG tablet Commonly known as: PLAVIX Take 1 tablet (75 mg total) by mouth daily.   divalproex 500 MG 24 hr tablet Commonly known as: DEPAKOTE ER Take 1 tablet (500 mg total) by mouth in the morning and at bedtime.   doxycycline 100 MG tablet Commonly known as: VIBRA-TABS Take 1 tablet (100 mg total) by mouth every 12 (twelve) hours.   furosemide 40 MG tablet Commonly known as: LASIX Take 1 tablet (40 mg total) by mouth daily.   gabapentin 100 MG capsule Commonly known as: NEURONTIN Take 2 capsules (200 mg total) by mouth 2 (two) times daily.   hydrALAZINE 50 MG tablet Commonly known as: APRESOLINE Take 1 tablet (50 mg total) by mouth every 8 (eight) hours.   Incruse Ellipta 62.5 MCG/INH Aepb Generic drug: umeclidinium bromide Inhale 1 puff into the lungs daily.   isosorbide mononitrate 30 MG 24 hr tablet Commonly known as: IMDUR Take 1 tablet (30 mg total) by mouth daily.   lisinopril 20 MG tablet Commonly known as: ZESTRIL Take 1 tablet (20 mg total) by mouth daily. What changed: when to take this   neomycin-bacitracin-polymyxin ointment Commonly known as: NEOSPORIN Apply 1 application topically 2 (two) times daily.   oxyCODONE 5 MG immediate release tablet Commonly known as: Oxy IR/ROXICODONE Take 1 tablet (5 mg total) by mouth every 4 (four) hours as needed for up to 2 days for severe pain.   predniSONE 10 MG tablet Commonly known as: DELTASONE Take 1 tablet (10 mg total) by mouth daily with breakfast. Start taking on: December 11, 2020   senna-docusate 8.6-50 MG tablet Commonly known as: Senokot-S Take 1 tablet by mouth at bedtime as needed for mild constipation.   spironolactone 25 MG tablet Commonly known as: ALDACTONE Take 0.5 tablets (12.5 mg total) by mouth daily.   traZODone 50  MG tablet Commonly known as: DESYREL Take 1 tablet (50 mg total) by mouth at bedtime.               Durable Medical Equipment  (From admission, onward)           Start     Ordered   12/10/20 1320  For home use only DME Crutches  Once        12/10/20 1402   12/10/20 1139  For home use only DME Crutches  Once        12/10/20 1138            Discharge Instructions: Please refer to Patient Instructions section of EMR for full details.  Patient was counseled important signs and symptoms that should prompt return to medical care, changes in medications, dietary instructions, activity restrictions, and follow up appointments.   Follow-Up Appointments:  Follow-up Information  [facility]. Go to.   Specialty: Urgent Care Why: For follow up services. Contact information: [address] [place] [address] [phone]        [facility] Follow up.   Why: They are offering a winter shelter on 12/10/20 and 12/11/20 from 8pm-7am.  Contact information: [address]                 [place], [place], DO 12/10/2020, 2:15 PM PGY-2, [facility]

## 2020-12-10 NOTE — Progress Notes (Incomplete)
Family Medicine Teaching Service Daily Progress Note Intern Pager: [phone]  Patient name: [name] Medical record number: [id number] Date of birth: Nov 08, 1966 Age: 55 y.o. Gender: male  Primary Care Provider: [place], [facility] Health Consultants: CSW  Code Status: Full  Pt Overview and Major Events to Date:  1/8 admitted 1/9 MRI neg for osteo  Assessment and Plan: [name] a 55 y.o.malepresenting with worsening pain associated with a R diabetic foot ulcer. PMH is significant forDM2, Schizoaffective disorder (bipolar type), COPD, polysubstance abuse, CHF, HTN, and homelessness.   RGreat ToeUlcer-  Pain is currently well-controlled on Oxycodone 5mg  q4.  Ulcer appears improved compared to previous on exam. Pt. Remains afebrile and without a white count. Patient is requesting crutches to help with ambulation - DME order for crutches  - Vitals per routine - Continue Doxy 100mg  q12 for a total of 7 days  - Oxycodone 5mg  q4h prn for pain control - Ambulatory podiatry consult for OP follow up  HFrEF  HTN  h/o CAD  h/o prolonged QTc -  Patient has not had any further episodes of hypotension since yesterday, but BPs remain well below goal (100s/60s). As noted previously, it is likely that his multiple BP meds have been added over the years due to non-compliance and he does not need to be on so many agents. Grade II/VI Murmur on exam, stable from previous. Last echo in 10/21 with EF 40-45% and Grade II diastolic dysfunction. Euvolemic on exam today. Most recent QTc 505.  - Continuehome meds, atorvastatin 20mg , carvedilol 3.125mg  BID, lasix 40mg  daily, clonidine 0.1mg  TID, hydralazine 50mg  TID, lisinopril 20mg  BID, spironolactone 12.5mg  daily, imdur 30mg  daily - Continue Plavix due to ASA intolerance - Increasing his atorvastatin dose to 40mg  daily - Monitor volume status  COPD- chronic, stable.  Patient breathing comfortably on RA with sats in 90s.  -Continue home  Incruse -albuterol PRN -Continuous pulse ox, supplemental O2 as needed -Goal SpO2 >88%  AKI-   Improved today. Cr 1.8>1.4 which is his baseline.   Macrocytic anemia-  Possibly related to malnutrition, part dilutional from fluids with IV abx - CBC, B12 pending  T2DM- chronic, diet controlled.  BS 149 today -monitor with daily labs  Polysubstance abuse  homelessness  -Monitor for withdrawal -TOC consult  Schizoaffective disorder Home meds include Depakote ER 500mg  BIDalthough patient states he hasn't taken it recently.Patient also has trazodone as a home medication which it appears per chart review he takes as needed at night. Last QTc was 505.  -restarted Depakote. - Continue to hold trazodone due to QTc prolongation  FEN/GI:Regular diet, 2L fluid restriction Prophylaxis:Lovenox Status is: Observation  The patient remains OBS appropriate and will d/c before 2 midnights.  Dispo: The patient is from: Home              Anticipated d/c is to: Home              Anticipated d/c date is: 1 day              Patient currently is medically stable to d/c.   Subjective:  [name] reports no acute complaints today. States that his pain is well-controlled on Oxy 5 q4 PRN as long as he gets it every four hours. He feels ready for discharge but is concerned about inadequate pain control upon discharge.  No SOB, CP, headache.   Objective: Temp:  [98.4 F (36.9 C)-98.9 F (37.2 C)] 98.7 F (37.1 C) (01/09 1902) Pulse Rate:  [75-86]  81 (01/09 1902) Resp:  [16] 16 (01/09 1902) BP: (80-111)/(43-80) 100/67 (01/09 1902) SpO2:  [97 %-98 %] 97 % (01/09 1902) Physical Exam: General: Well-appearing Cardiovascular: RRR, Grade II/VI systolic murmur as noted previously  Respiratory: Lungs with mild diffuse wheezes, no rales or rhonchi  Abdomen: Soft, non-tender, non-distended, bowel sounds nl Extremities: Ulcer of inferomedial aspect of R great toe improved compared to  previous. Without swelling or drainage. No overlying erythema or warmth.   Laboratory: Recent Labs  Lab 12/05/20 1454 12/08/20 0040 12/09/20 0402  WBC 7.2 6.9 9.1  HGB 14.4 13.3 11.9*  HCT 42.9 38.7* 37.0*  PLT 403* 328 279   Recent Labs  Lab 12/05/20 1454 12/08/20 0040 12/09/20 0402  NA 139 137 136  K 4.2 4.1 4.1  CL 105 103 106  CO2 20* 21* 21*  BUN 12 17 18   CREATININE 1.35* 1.39* 1.80*  CALCIUM 8.7* 8.4* 8.1*  PROT 6.7  --   --   BILITOT 1.2  --   --   ALKPHOS 74  --   --   ALT 23  --   --   AST 46*  --   --   GLUCOSE 71 85 76    Imaging/Diagnostic Tests: No new imaging or tests today.   , Medical Student 12/10/2020, 6:49 AM

## 2020-12-10 NOTE — Discharge Instructions (Signed)
Discharge instructions: We are discharging you on antibiotics for a few days as well as prednisone.  For pain control we are discharging you on a few days of oxycodone.  We are also placing a referral for podiatry.  They should reach out to you in the next 1 to 2 weeks to schedule an appointment to evaluate you in the clinic.  It is important that she get established with a regular clinic for primary care to monitor you for your blood pressure and your electrolytes.

## 2020-12-10 NOTE — Plan of Care (Signed)
Patient alert and oriented, mae's well, voiding adequate amount of urine, swallowing without difficulty, no c/o pain at time of discharge. Patient discharged per order. Crutches and discharged instructions given to patient. Patient and  stated understanding of instructions given. Patient was discharge with all belongings at his side

## 2020-12-10 NOTE — Progress Notes (Addendum)
CSW received consult for assistance with substance use and homelessness. Patient is well-known to [facility] team with frequent readmissions. CSW placed winter shelter information on AVS and follow up information. CSW provided taxi voucher.   Seynabou Fults [name]

## 2020-12-10 NOTE — Progress Notes (Signed)
Orthopedic Tech Progress Note Patient Details:  [name] 04-Aug-1966 [id number] RN called requesting a pair of CRUTCHES Ortho Devices Type of Ortho Device: Crutches Ortho Device/Splint Interventions: Adjustment   Post Interventions Patient Tolerated: Ambulated well,Well Instructions Provided: Poper ambulation with device,Care of device   [name] 12/10/2020, 2:55 PM

## 2020-12-10 NOTE — Progress Notes (Signed)
OT Cancellation Note  Patient Details Name: [name] MRN: [id number] DOB: 1966/11/04   Cancelled Treatment:    Reason Eval/Treat Not Completed: Patient declined, no reason specified. Pt refused stating, " I'm not going to do anything until the doctors let me know what I can do" Pt has refused PT as well. OT will sign off,  please re consult if pt changes his mind and is willing to participate.  Margaretmary Eddy [facility] 12/10/2020, 9:49 AM

## 2020-12-13 ENCOUNTER — Ambulatory Visit: Payer: Medicaid Other | Admitting: Family Medicine

## 2020-12-13 LAB — CULTURE, BLOOD (ROUTINE X 2)
Culture: NO GROWTH
Culture: NO GROWTH
Special Requests: ADEQUATE
Special Requests: ADEQUATE

## 2020-12-17 ENCOUNTER — Emergency Department (HOSPITAL_COMMUNITY): Payer: Medicaid Other | Admitting: Certified Registered"

## 2020-12-17 ENCOUNTER — Inpatient Hospital Stay (HOSPITAL_COMMUNITY): Payer: Medicaid Other

## 2020-12-17 ENCOUNTER — Emergency Department (HOSPITAL_COMMUNITY): Payer: Medicaid Other

## 2020-12-17 ENCOUNTER — Other Ambulatory Visit: Payer: Self-pay

## 2020-12-17 ENCOUNTER — Encounter (HOSPITAL_COMMUNITY)
Admission: EM | Disposition: A | Discharge status: Home / Self Care | Payer: Self-pay | Source: Home / Self Care | Attending: Internal Medicine

## 2020-12-17 ENCOUNTER — Inpatient Hospital Stay (HOSPITAL_COMMUNITY)
Admission: EM | Admit: 2020-12-17 | Discharge: 2021-02-03 | DRG: 040 | Disposition: A | Discharge status: Home / Self Care | Payer: Medicaid Other | Attending: Internal Medicine | Admitting: Internal Medicine

## 2020-12-17 ENCOUNTER — Other Ambulatory Visit (HOSPITAL_COMMUNITY): Payer: Self-pay

## 2020-12-17 DIAGNOSIS — I651 Occlusion and stenosis of basilar artery: Secondary | ICD-10-CM | POA: Diagnosis present

## 2020-12-17 DIAGNOSIS — R4182 Altered mental status, unspecified: Secondary | ICD-10-CM

## 2020-12-17 DIAGNOSIS — F319 Bipolar disorder, unspecified: Secondary | ICD-10-CM | POA: Diagnosis present

## 2020-12-17 DIAGNOSIS — Z9889 Other specified postprocedural states: Secondary | ICD-10-CM

## 2020-12-17 DIAGNOSIS — Z5900 Homelessness unspecified: Secondary | ICD-10-CM

## 2020-12-17 DIAGNOSIS — L97524 Non-pressure chronic ulcer of other part of left foot with necrosis of bone: Secondary | ICD-10-CM | POA: Diagnosis present

## 2020-12-17 DIAGNOSIS — F1410 Cocaine abuse, uncomplicated: Secondary | ICD-10-CM | POA: Diagnosis present

## 2020-12-17 DIAGNOSIS — G249 Dystonia, unspecified: Secondary | ICD-10-CM

## 2020-12-17 DIAGNOSIS — I6300 Cerebral infarction due to thrombosis of unspecified precerebral artery: Secondary | ICD-10-CM | POA: Diagnosis not present

## 2020-12-17 DIAGNOSIS — I2510 Atherosclerotic heart disease of native coronary artery without angina pectoris: Secondary | ICD-10-CM | POA: Diagnosis present

## 2020-12-17 DIAGNOSIS — I639 Cerebral infarction, unspecified: Principal | ICD-10-CM | POA: Diagnosis present

## 2020-12-17 DIAGNOSIS — Z4659 Encounter for fitting and adjustment of other gastrointestinal appliance and device: Secondary | ICD-10-CM

## 2020-12-17 DIAGNOSIS — R471 Dysarthria and anarthria: Secondary | ICD-10-CM

## 2020-12-17 DIAGNOSIS — I10 Essential (primary) hypertension: Secondary | ICD-10-CM | POA: Diagnosis present

## 2020-12-17 DIAGNOSIS — L97519 Non-pressure chronic ulcer of other part of right foot with unspecified severity: Secondary | ICD-10-CM

## 2020-12-17 DIAGNOSIS — Z0279 Encounter for issue of other medical certificate: Secondary | ICD-10-CM

## 2020-12-17 DIAGNOSIS — L97513 Non-pressure chronic ulcer of other part of right foot with necrosis of muscle: Secondary | ICD-10-CM | POA: Diagnosis present

## 2020-12-17 DIAGNOSIS — J9690 Respiratory failure, unspecified, unspecified whether with hypoxia or hypercapnia: Secondary | ICD-10-CM | POA: Diagnosis present

## 2020-12-17 DIAGNOSIS — J9601 Acute respiratory failure with hypoxia: Secondary | ICD-10-CM

## 2020-12-17 DIAGNOSIS — M869 Osteomyelitis, unspecified: Secondary | ICD-10-CM | POA: Diagnosis present

## 2020-12-17 DIAGNOSIS — K7200 Acute and subacute hepatic failure without coma: Secondary | ICD-10-CM | POA: Diagnosis present

## 2020-12-17 DIAGNOSIS — M86172 Other acute osteomyelitis, left ankle and foot: Secondary | ICD-10-CM | POA: Diagnosis present

## 2020-12-17 DIAGNOSIS — E785 Hyperlipidemia, unspecified: Secondary | ICD-10-CM | POA: Diagnosis present

## 2020-12-17 DIAGNOSIS — I5043 Acute on chronic combined systolic (congestive) and diastolic (congestive) heart failure: Secondary | ICD-10-CM | POA: Diagnosis present

## 2020-12-17 DIAGNOSIS — G9341 Metabolic encephalopathy: Secondary | ICD-10-CM | POA: Diagnosis present

## 2020-12-17 DIAGNOSIS — K0889 Other specified disorders of teeth and supporting structures: Secondary | ICD-10-CM

## 2020-12-17 DIAGNOSIS — G928 Other toxic encephalopathy: Secondary | ICD-10-CM | POA: Diagnosis not present

## 2020-12-17 DIAGNOSIS — F1010 Alcohol abuse, uncomplicated: Secondary | ICD-10-CM | POA: Diagnosis present

## 2020-12-17 DIAGNOSIS — Z7189 Other specified counseling: Secondary | ICD-10-CM | POA: Diagnosis not present

## 2020-12-17 DIAGNOSIS — F17210 Nicotine dependence, cigarettes, uncomplicated: Secondary | ICD-10-CM | POA: Diagnosis present

## 2020-12-17 DIAGNOSIS — I6523 Occlusion and stenosis of bilateral carotid arteries: Secondary | ICD-10-CM | POA: Diagnosis present

## 2020-12-17 DIAGNOSIS — R45851 Suicidal ideations: Secondary | ICD-10-CM | POA: Diagnosis not present

## 2020-12-17 DIAGNOSIS — Z20822 Contact with and (suspected) exposure to covid-19: Secondary | ICD-10-CM | POA: Diagnosis not present

## 2020-12-17 DIAGNOSIS — Z6825 Body mass index (BMI) 25.0-25.9, adult: Secondary | ICD-10-CM

## 2020-12-17 DIAGNOSIS — L97529 Non-pressure chronic ulcer of other part of left foot with unspecified severity: Secondary | ICD-10-CM | POA: Diagnosis not present

## 2020-12-17 DIAGNOSIS — L97514 Non-pressure chronic ulcer of other part of right foot with necrosis of bone: Secondary | ICD-10-CM | POA: Diagnosis present

## 2020-12-17 DIAGNOSIS — I6502 Occlusion and stenosis of left vertebral artery: Secondary | ICD-10-CM | POA: Diagnosis present

## 2020-12-17 DIAGNOSIS — Z79899 Other long term (current) drug therapy: Secondary | ICD-10-CM

## 2020-12-17 DIAGNOSIS — I63441 Cerebral infarction due to embolism of right cerebellar artery: Secondary | ICD-10-CM | POA: Diagnosis not present

## 2020-12-17 DIAGNOSIS — G3184 Mild cognitive impairment, so stated: Secondary | ICD-10-CM | POA: Diagnosis not present

## 2020-12-17 DIAGNOSIS — G8929 Other chronic pain: Secondary | ICD-10-CM | POA: Diagnosis present

## 2020-12-17 DIAGNOSIS — E669 Obesity, unspecified: Secondary | ICD-10-CM | POA: Diagnosis present

## 2020-12-17 DIAGNOSIS — Z9181 History of falling: Secondary | ICD-10-CM

## 2020-12-17 DIAGNOSIS — I110 Hypertensive heart disease with heart failure: Secondary | ICD-10-CM | POA: Diagnosis present

## 2020-12-17 DIAGNOSIS — E1169 Type 2 diabetes mellitus with other specified complication: Secondary | ICD-10-CM | POA: Diagnosis present

## 2020-12-17 DIAGNOSIS — K029 Dental caries, unspecified: Secondary | ICD-10-CM | POA: Diagnosis present

## 2020-12-17 DIAGNOSIS — Z87892 Personal history of anaphylaxis: Secondary | ICD-10-CM

## 2020-12-17 DIAGNOSIS — Z515 Encounter for palliative care: Secondary | ICD-10-CM | POA: Diagnosis not present

## 2020-12-17 DIAGNOSIS — E871 Hypo-osmolality and hyponatremia: Secondary | ICD-10-CM | POA: Diagnosis not present

## 2020-12-17 DIAGNOSIS — R569 Unspecified convulsions: Secondary | ICD-10-CM | POA: Diagnosis not present

## 2020-12-17 DIAGNOSIS — R680 Hypothermia, not associated with low environmental temperature: Secondary | ICD-10-CM | POA: Diagnosis present

## 2020-12-17 DIAGNOSIS — Z91040 Latex allergy status: Secondary | ICD-10-CM

## 2020-12-17 DIAGNOSIS — B179 Acute viral hepatitis, unspecified: Secondary | ICD-10-CM | POA: Diagnosis not present

## 2020-12-17 DIAGNOSIS — E11649 Type 2 diabetes mellitus with hypoglycemia without coma: Secondary | ICD-10-CM | POA: Diagnosis present

## 2020-12-17 DIAGNOSIS — I63542 Cerebral infarction due to unspecified occlusion or stenosis of left cerebellar artery: Secondary | ICD-10-CM | POA: Diagnosis not present

## 2020-12-17 DIAGNOSIS — R270 Ataxia, unspecified: Secondary | ICD-10-CM | POA: Diagnosis present

## 2020-12-17 DIAGNOSIS — G8194 Hemiplegia, unspecified affecting left nondominant side: Secondary | ICD-10-CM | POA: Diagnosis present

## 2020-12-17 DIAGNOSIS — J449 Chronic obstructive pulmonary disease, unspecified: Secondary | ICD-10-CM | POA: Diagnosis present

## 2020-12-17 DIAGNOSIS — Z9119 Patient's noncompliance with other medical treatment and regimen: Secondary | ICD-10-CM

## 2020-12-17 DIAGNOSIS — I429 Cardiomyopathy, unspecified: Secondary | ICD-10-CM | POA: Diagnosis not present

## 2020-12-17 DIAGNOSIS — S0003XA Contusion of scalp, initial encounter: Secondary | ICD-10-CM | POA: Diagnosis present

## 2020-12-17 DIAGNOSIS — G8104 Flaccid hemiplegia affecting left nondominant side: Secondary | ICD-10-CM | POA: Diagnosis present

## 2020-12-17 DIAGNOSIS — Z888 Allergy status to other drugs, medicaments and biological substances status: Secondary | ICD-10-CM

## 2020-12-17 DIAGNOSIS — R54 Age-related physical debility: Secondary | ICD-10-CM | POA: Diagnosis present

## 2020-12-17 DIAGNOSIS — L0390 Cellulitis, unspecified: Secondary | ICD-10-CM | POA: Diagnosis not present

## 2020-12-17 DIAGNOSIS — E11622 Type 2 diabetes mellitus with other skin ulcer: Secondary | ICD-10-CM | POA: Diagnosis present

## 2020-12-17 DIAGNOSIS — Z5901 Sheltered homelessness: Secondary | ICD-10-CM

## 2020-12-17 DIAGNOSIS — Z1611 Resistance to penicillins: Secondary | ICD-10-CM | POA: Diagnosis present

## 2020-12-17 DIAGNOSIS — R4587 Impulsiveness: Secondary | ICD-10-CM | POA: Diagnosis not present

## 2020-12-17 DIAGNOSIS — Z91010 Allergy to peanuts: Secondary | ICD-10-CM

## 2020-12-17 DIAGNOSIS — F14129 Cocaine abuse with intoxication, unspecified: Secondary | ICD-10-CM | POA: Diagnosis present

## 2020-12-17 DIAGNOSIS — Z886 Allergy status to analgesic agent status: Secondary | ICD-10-CM

## 2020-12-17 DIAGNOSIS — B964 Proteus (mirabilis) (morganii) as the cause of diseases classified elsewhere: Secondary | ICD-10-CM | POA: Diagnosis present

## 2020-12-17 DIAGNOSIS — F250 Schizoaffective disorder, bipolar type: Secondary | ICD-10-CM | POA: Diagnosis present

## 2020-12-17 DIAGNOSIS — M86671 Other chronic osteomyelitis, right ankle and foot: Secondary | ICD-10-CM | POA: Diagnosis not present

## 2020-12-17 DIAGNOSIS — Z7902 Long term (current) use of antithrombotics/antiplatelets: Secondary | ICD-10-CM

## 2020-12-17 DIAGNOSIS — R29735 NIHSS score 35: Secondary | ICD-10-CM | POA: Diagnosis not present

## 2020-12-17 DIAGNOSIS — I428 Other cardiomyopathies: Secondary | ICD-10-CM | POA: Diagnosis not present

## 2020-12-17 DIAGNOSIS — R29810 Facial weakness: Secondary | ICD-10-CM | POA: Diagnosis present

## 2020-12-17 DIAGNOSIS — M86672 Other chronic osteomyelitis, left ankle and foot: Secondary | ICD-10-CM | POA: Diagnosis not present

## 2020-12-17 HISTORY — PX: IR ANGIO INTRA EXTRACRAN SEL COM CAROTID INNOMINATE BILAT MOD SED: IMG5360

## 2020-12-17 HISTORY — PX: IR ANGIO VERTEBRAL SEL VERTEBRAL BILAT MOD SED: IMG5369

## 2020-12-17 HISTORY — PX: RADIOLOGY WITH ANESTHESIA: SHX6223

## 2020-12-17 LAB — URINALYSIS, ROUTINE W REFLEX MICROSCOPIC
Bilirubin Urine: NEGATIVE
Glucose, UA: >=500 mg/dL — AB
Hgb urine dipstick: NEGATIVE
Ketones, ur: 5 mg/dL — AB
Leukocytes,Ua: NEGATIVE
Nitrite: NEGATIVE
Protein, ur: 100 mg/dL — AB
Specific Gravity, Urine: >1.046 — ABNORMAL HIGH (ref 1.005–1.030)
pH: 5 (ref 5.0–8.0)

## 2020-12-17 LAB — CBC
HCT: 45.4 % (ref 39.0–52.0)
Hemoglobin: 15 g/dL (ref 13.0–17.0)
MCH: 32.7 pg (ref 26.0–34.0)
MCHC: 33 g/dL (ref 30.0–36.0)
MCV: 98.9 fL (ref 80.0–100.0)
Platelets: 396 10*3/uL (ref 150–400)
RBC: 4.59 MIL/uL (ref 4.22–5.81)
RDW: 15.7 % — ABNORMAL HIGH (ref 11.5–15.5)
WBC: 9.8 10*3/uL (ref 4.0–10.5)
nRBC: 0.2 % (ref 0.0–0.2)

## 2020-12-17 LAB — TROPONIN I (HIGH SENSITIVITY)
Troponin I (High Sensitivity): 46 ng/L — ABNORMAL HIGH (ref <18)
Troponin I (High Sensitivity): 45 ng/L — ABNORMAL HIGH (ref <18)

## 2020-12-17 LAB — RAPID URINE DRUG SCREEN, HOSP PERFORMED
Amphetamines: NOT DETECTED
Barbiturates: NOT DETECTED
Benzodiazepines: NOT DETECTED
Cocaine: POSITIVE — AB
Opiates: NOT DETECTED
Tetrahydrocannabinol: NOT DETECTED

## 2020-12-17 LAB — POCT I-STAT 7, (LYTES, BLD GAS, ICA,H+H)
Acid-base deficit: 1 mmol/L (ref 0.0–2.0)
Bicarbonate: 24.6 mmol/L (ref 20.0–28.0)
Calcium, Ion: 1.16 mmol/L (ref 1.15–1.40)
HCT: 40 % (ref 39.0–52.0)
Hemoglobin: 13.6 g/dL (ref 13.0–17.0)
O2 Saturation: 98 %
Patient temperature: 96.2
Potassium: 3.8 mmol/L (ref 3.5–5.1)
Sodium: 137 mmol/L (ref 135–145)
TCO2: 26 mmol/L (ref 22–32)
pCO2 arterial: 42.2 mmHg (ref 32.0–48.0)
pH, Arterial: 7.367 (ref 7.350–7.450)
pO2, Arterial: 107 mmHg (ref 83.0–108.0)

## 2020-12-17 LAB — DIFFERENTIAL
Abs Immature Granulocytes: 0.04 10*3/uL (ref 0.00–0.07)
Basophils Absolute: 0.1 10*3/uL (ref 0.0–0.1)
Basophils Relative: 1 %
Eosinophils Absolute: 0 10*3/uL (ref 0.0–0.5)
Eosinophils Relative: 0 %
Immature Granulocytes: 0 %
Lymphocytes Relative: 38 %
Lymphs Abs: 3.7 10*3/uL (ref 0.7–4.0)
Monocytes Absolute: 1 10*3/uL (ref 0.1–1.0)
Monocytes Relative: 11 %
Neutro Abs: 4.9 10*3/uL (ref 1.7–7.7)
Neutrophils Relative %: 50 %

## 2020-12-17 LAB — I-STAT CHEM 8, ED
BUN: 21 mg/dL (ref 8–23)
Calcium, Ion: 0.97 mmol/L — ABNORMAL LOW (ref 1.15–1.40)
Chloride: 105 mmol/L (ref 98–111)
Creatinine, Ser: 1.1 mg/dL (ref 0.61–1.24)
Glucose, Bld: 105 mg/dL — ABNORMAL HIGH (ref 70–99)
HCT: 48 % (ref 39.0–52.0)
Hemoglobin: 16.3 g/dL (ref 13.0–17.0)
Potassium: 4.3 mmol/L (ref 3.5–5.1)
Sodium: 137 mmol/L (ref 135–145)
TCO2: 22 mmol/L (ref 22–32)

## 2020-12-17 LAB — RESP PANEL BY RT-PCR (FLU A&B, COVID) ARPGX2
Influenza A by PCR: NEGATIVE
Influenza B by PCR: NEGATIVE
SARS Coronavirus 2 by RT PCR: NEGATIVE

## 2020-12-17 LAB — TSH: TSH: 3.172 u[IU]/mL (ref 0.350–4.500)

## 2020-12-17 LAB — CBG MONITORING, ED
Glucose-Capillary: 49 mg/dL — ABNORMAL LOW (ref 70–99)
Glucose-Capillary: 54 mg/dL — ABNORMAL LOW (ref 70–99)
Glucose-Capillary: 266 mg/dL — ABNORMAL HIGH (ref 70–99)

## 2020-12-17 LAB — CORTISOL: Cortisol, Plasma: 21.5 ug/dL

## 2020-12-17 LAB — LACTIC ACID, PLASMA: Lactic Acid, Venous: 1.1 mmol/L (ref 0.5–1.9)

## 2020-12-17 LAB — VITAMIN B12: Vitamin B-12: 1124 pg/mL — ABNORMAL HIGH (ref 180–914)

## 2020-12-17 LAB — FOLATE: Folate: 13.8 ng/mL (ref >5.9)

## 2020-12-17 LAB — HIV ANTIBODY (ROUTINE TESTING W REFLEX): HIV Screen 4th Generation wRfx: NONREACTIVE

## 2020-12-17 LAB — ETHANOL: Alcohol, Ethyl (B): <10 mg/dL (ref <10)

## 2020-12-17 LAB — PROTIME-INR
INR: 1.5 — ABNORMAL HIGH (ref 0.8–1.2)
Prothrombin Time: 17.5 seconds — ABNORMAL HIGH (ref 11.4–15.2)

## 2020-12-17 LAB — MRSA PCR SCREENING: MRSA by PCR: NEGATIVE

## 2020-12-17 LAB — ACETAMINOPHEN LEVEL: Acetaminophen (Tylenol), Serum: <10 ug/mL — ABNORMAL LOW (ref 10–30)

## 2020-12-17 LAB — APTT: aPTT: 27 seconds (ref 24–36)

## 2020-12-17 IMAGING — CT CT HEAD CODE STROKE
2 of 3 series · 15 of 40 positions shown, 18 images · non-contrast
Comparison: None.

CLINICAL DATA: Code stroke.  Neuro deficit, acute, stroke suspected

EXAM:
CT HEAD WITHOUT CONTRAST
TECHNIQUE: Contiguous axial images were obtained from the base of the skull
through the vertex without intravenous contrast.

[Series 3: head 5.0 st · axial · 0.46mm/px · z∈[-101,+54]mm · 12 of 37 slices shown, 15 images]
[im 3/37  brain]
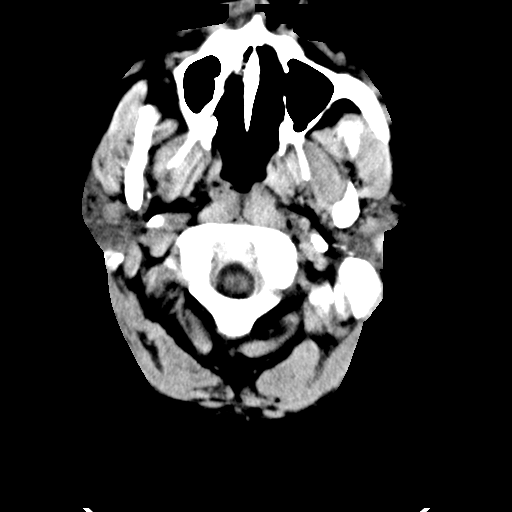
[im 3/37  bone]
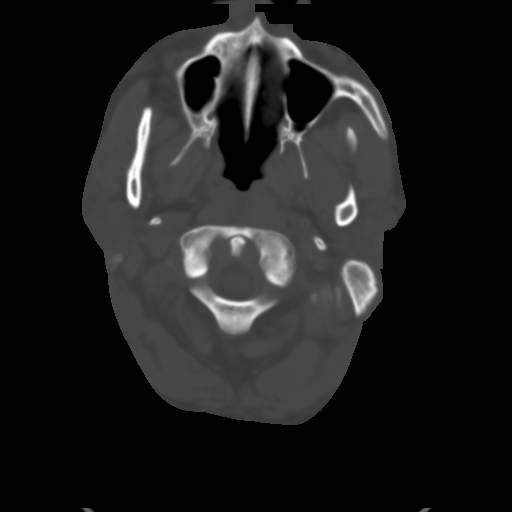
[im 5/37  brain]
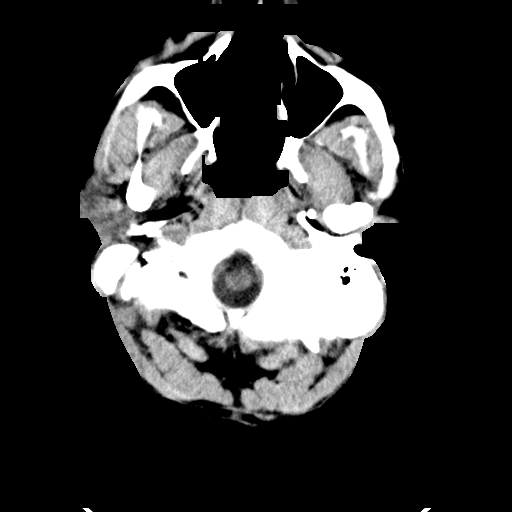
[im 8/37  brain]
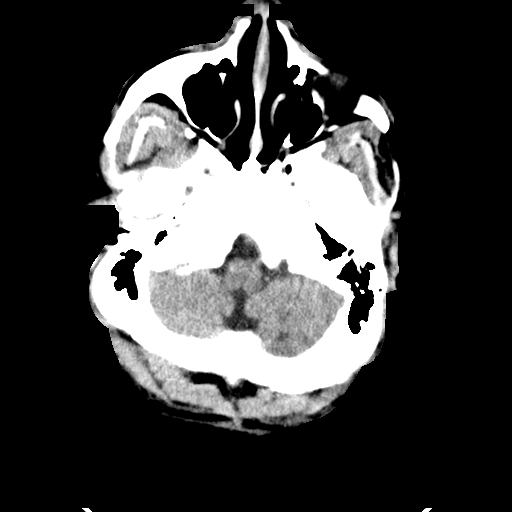
[im 12/37  brain]
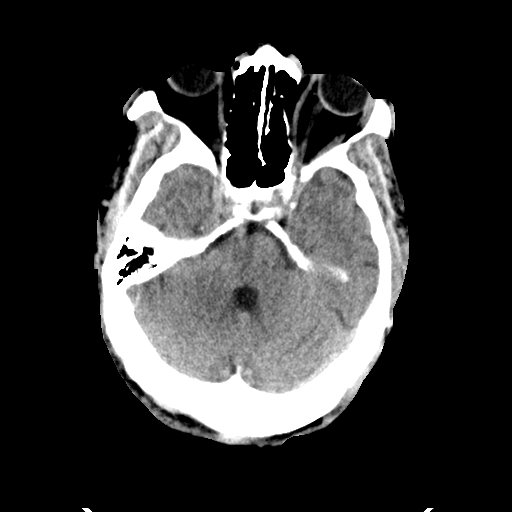
[im 14/37  brain]
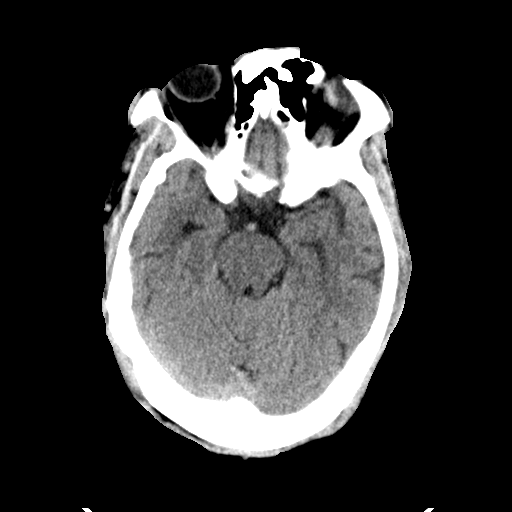
[im 14/37  bone]
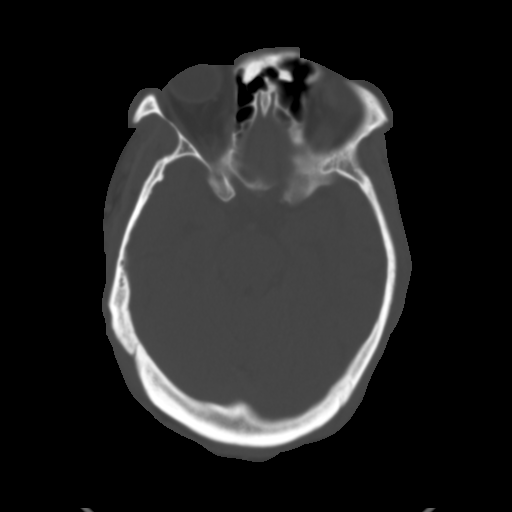
[im 17/37  brain]
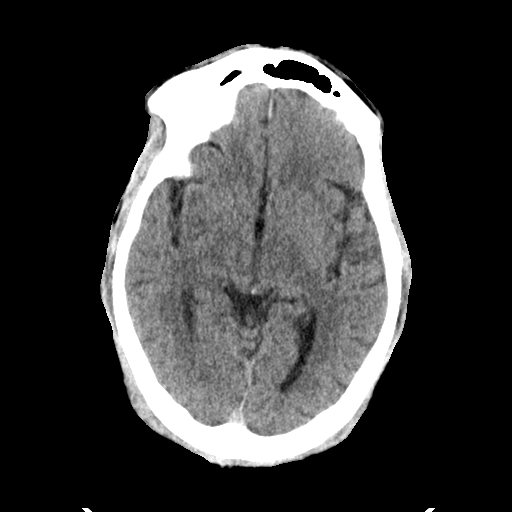
[im 20/37  brain]
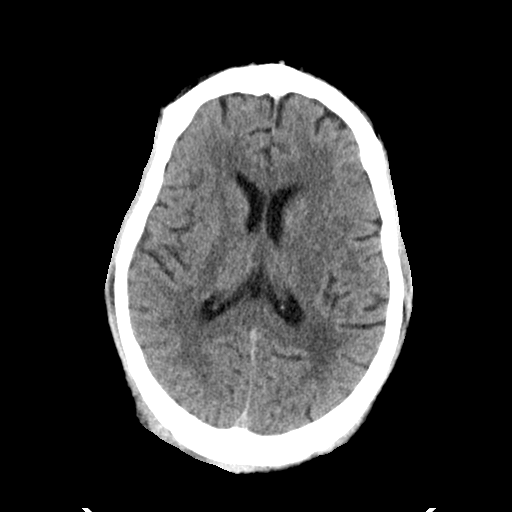
[im 23/37  brain]
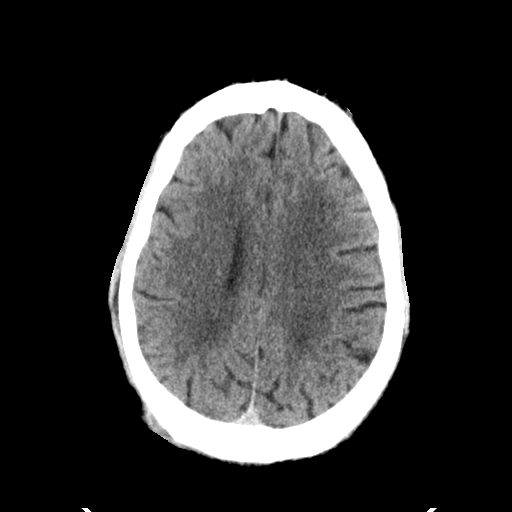
[im 25/37  brain]
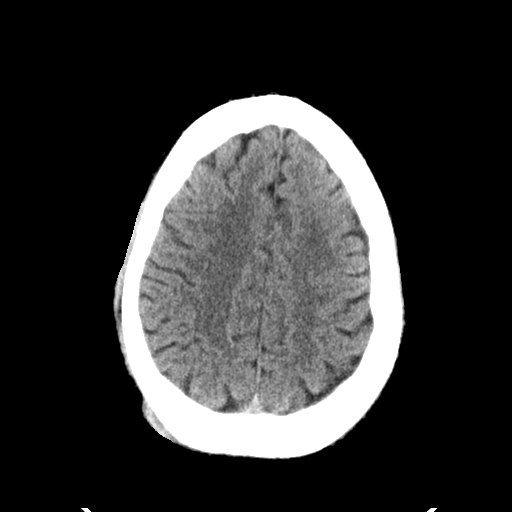
[im 25/37  bone]
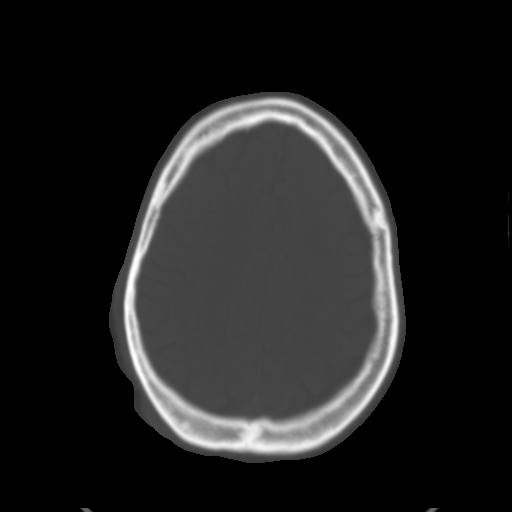
[im 29/37  brain]
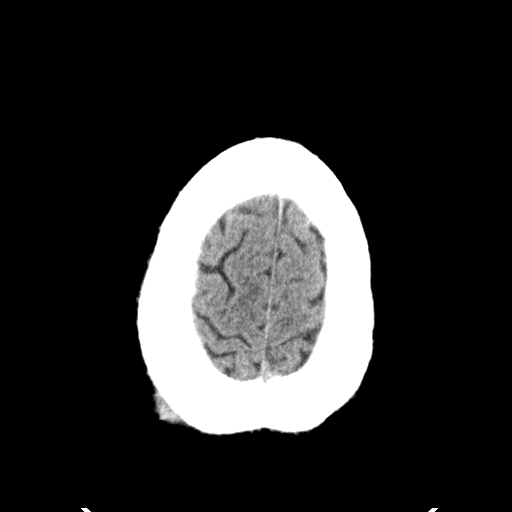
[im 32/37  brain]
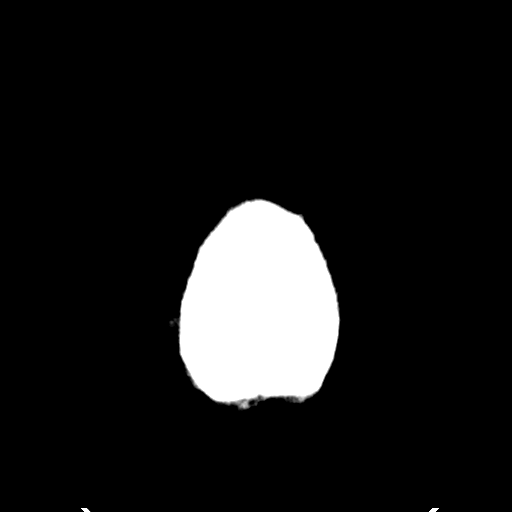
[im 34/37  brain]
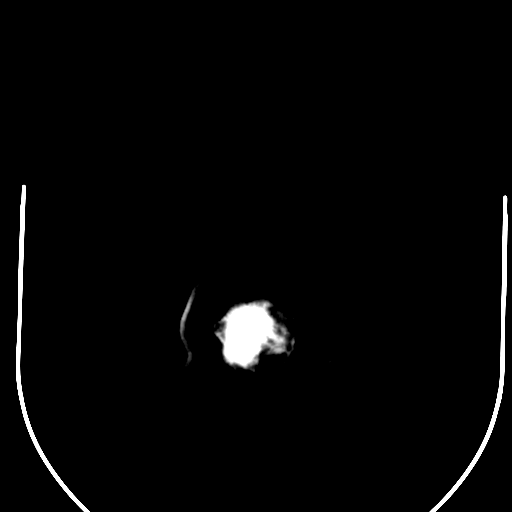

[Series 5: head 3.0 cor st · coronal · 0.38mm/px · 3 of 75 slices shown]
[im 19/75  brain]
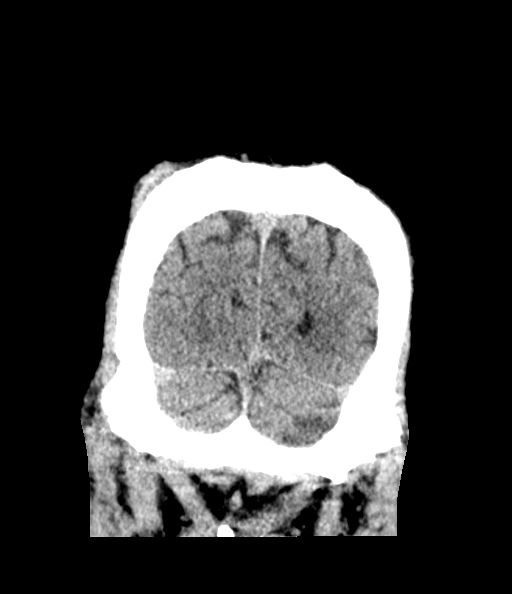
[im 38/75  brain]
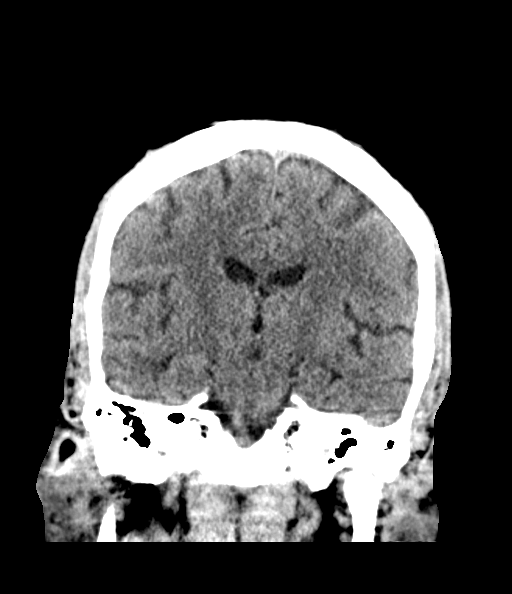
[im 56/75  brain]
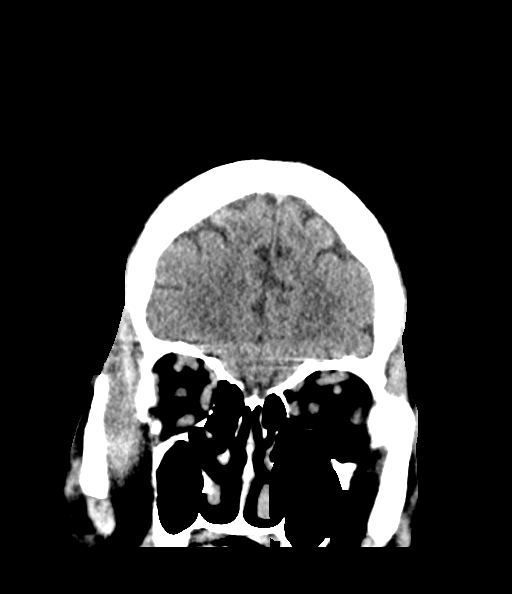

[15 of 40 positions shown; findings below may reference images not displayed]

FINDINGS: Brain: No intracranial hemorrhage. Focal left cerebellar
hypodensity. No mass lesion. No midline shift, ventriculomegaly or
extra-axial fluid collection.

Vascular: No hyperdense vessel or unexpected calcification.

Skull: Negative for fracture or focal lesion.

Sinuses/Orbits: Normal orbits. Chronic defect of the medial left
orbital wall. Clear paranasal sinuses. No mastoid effusion.

Other: Small right parietal scalp hematoma.

ASPECTS (Alberta Stroke Program Early CT Score)

- Ganglionic level infarction (caudate, lentiform nuclei, internal
capsule, insula, M1-M3 cortex): 7

- Supraganglionic infarction (M4-M6 cortex): 3

Total score (0-10 with 10 being normal): 10
IMPRESSION: 1. Left cerebellar hypodensity concerning for acute/subacute
infarct.
2. No intracranial hemorrhage.
3. ASPECTS is 10
4. Small right parietal scalp hematoma.
5. Code stroke imaging results were communicated on [DATE] at
[DATE] to provider Dr. NAGI via secure text paging.

## 2020-12-17 IMAGING — DX DG ABD PORTABLE 1V
1 series · 1 of 1 positions shown · non-contrast
Comparison: None.

CLINICAL DATA: OG tube placement

EXAM:
PORTABLE ABDOMEN - 1 VIEW

[abdomen kub]
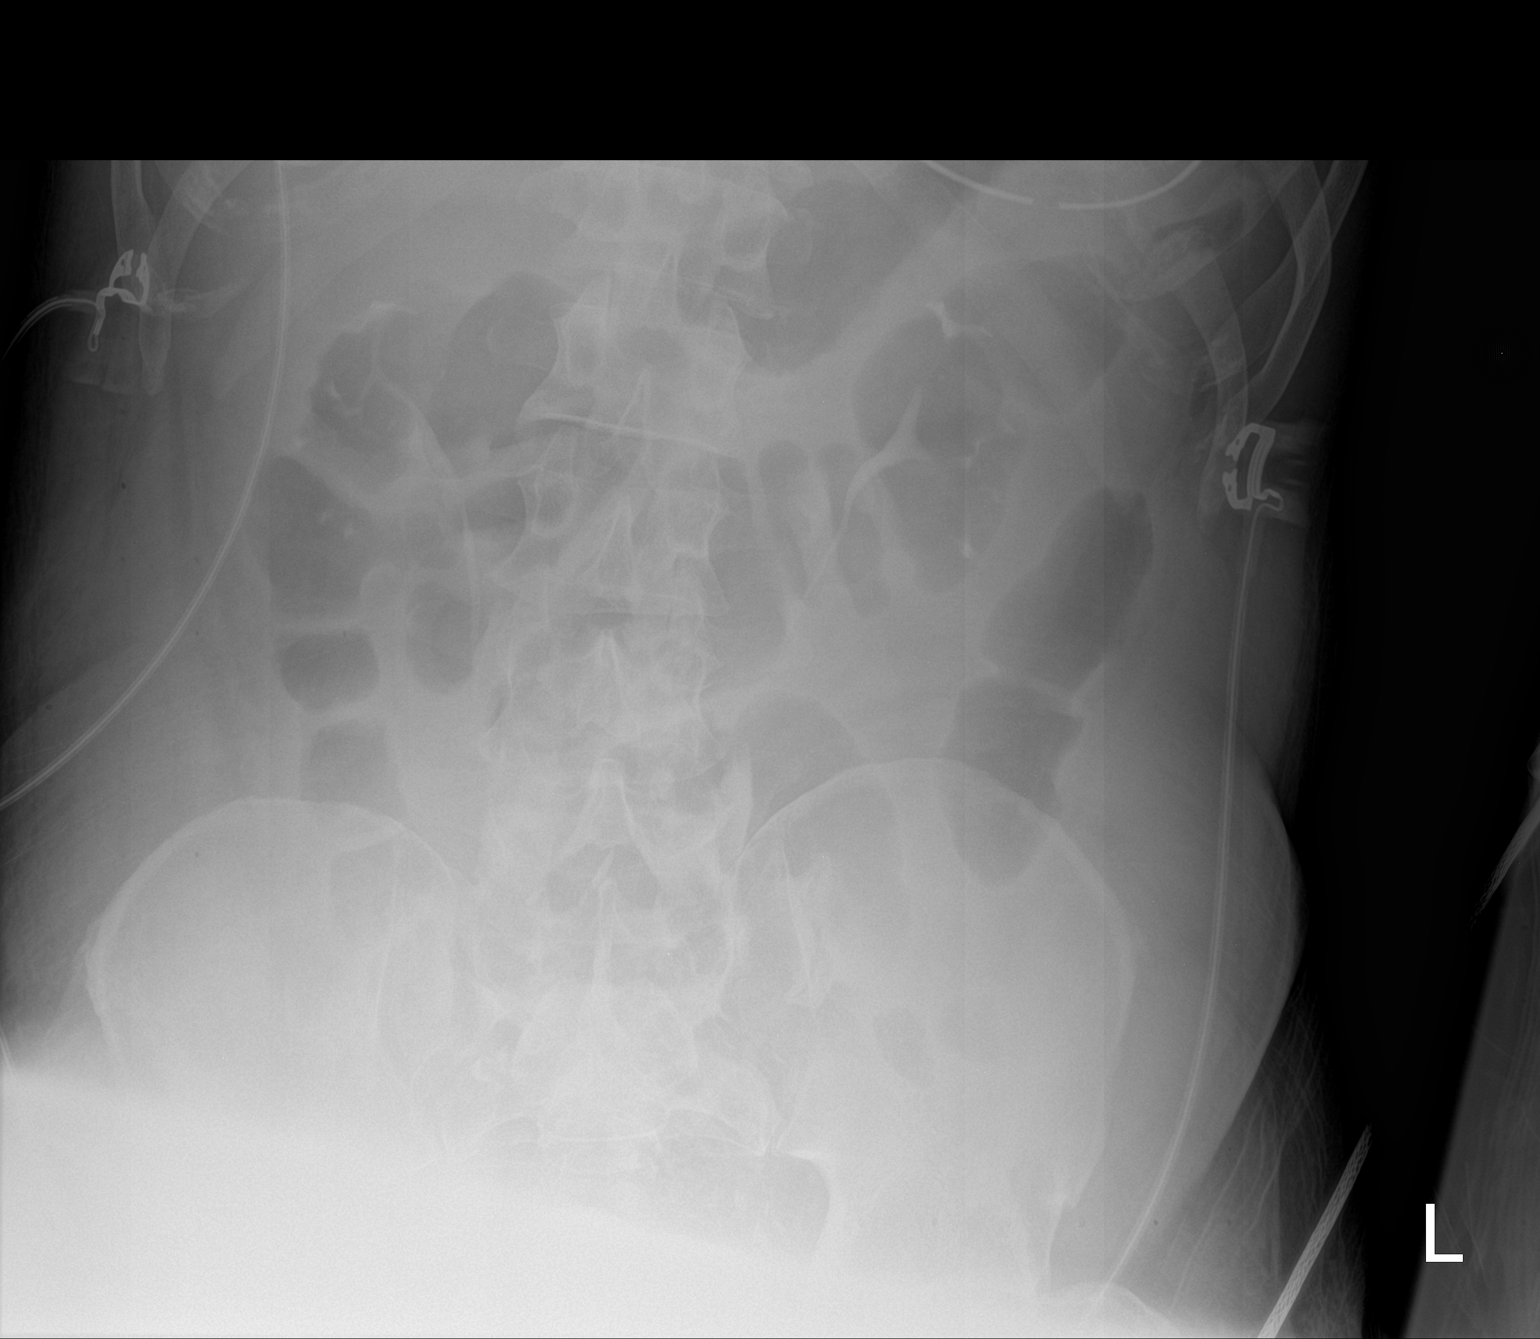

[1 of 1 positions shown; findings below may reference images not displayed]

FINDINGS: Nonobstructed gas pattern. Excreted contrast within the renal
collecting systems. Esophageal tube tip and side port overlie the
proximal stomach.
IMPRESSION: Esophageal tube tip overlies the proximal stomach.

## 2020-12-17 IMAGING — CT CT ANGIO HEAD
1 of 15 series · 2 of 33 positions shown · IV contrast (OMNI 350)
Comparison: [DATE].

CLINICAL DATA: Neuro deficit, acute, stroke suspected

EXAM:
CT ANGIOGRAPHY HEAD AND NECK
TECHNIQUE: Multidetector CT imaging of the head and neck was performed using
the standard protocol during bolus administration of intravenous
contrast. Multiplanar CT image reconstructions and MIPs were
obtained to evaluate the vascular anatomy. Carotid stenosis
measurements (when applicable) are obtained utilizing NASCET
criteria, using the distal internal carotid diameter as the
denominator.
CONTRAST:  100mL OMNIPAQUE IOHEXOL 350 MG/ML SOLN

[Series 15: cta neck axial · axial · 0.39mm/px · z∈[-206,-84]mm · 2 of 369 slices shown]
[im 123/369  soft-tissue]
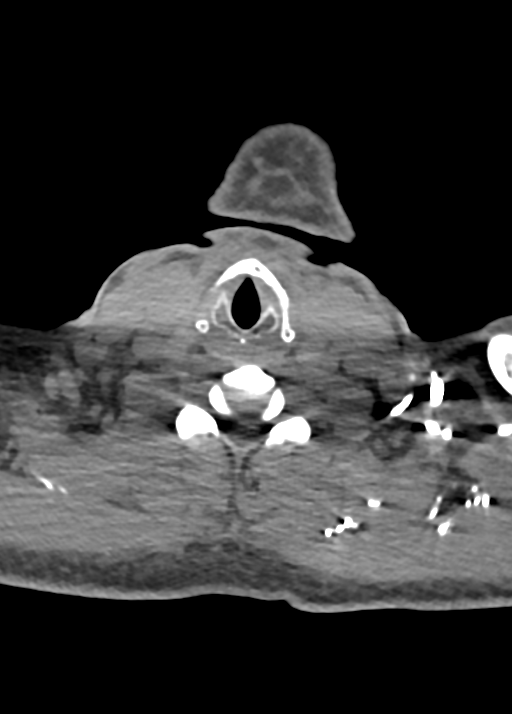
[im 246/369  bone]
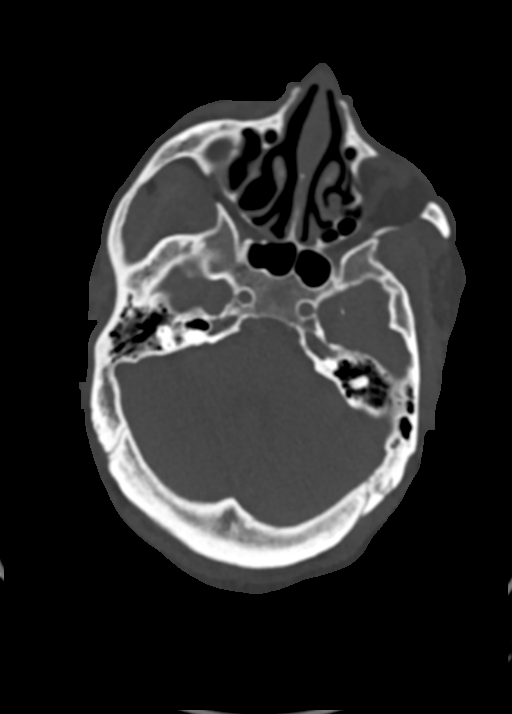

[2 of 33 positions shown; findings below may reference images not displayed]

FINDINGS: Please note evaluation is limited secondary to suboptimal contrast
bolus timing.

CTA NECK FINDINGS

Aortic arch: Standard branching. Aortic arch atheromatous disease.
Imaged portion shows no evidence of aneurysm or dissection. No
significant stenosis of the major arch vessel origins.

Right carotid system: Patent. Carotid bifurcation atheromatous
disease with 50% narrowing of the carotid bulb.

Left carotid system: Patent. Carotid bifurcation atheromatous
disease with 30% narrowing of the carotid bulb.

Vertebral arteries: Dominant right vertebral artery. The right
vertebral artery is patent. Likely retrograde filling of the distal
left V4 segment. There is non opacification of the left vertebral
artery proximally.

Skeleton: No acute finding.  Multilevel spondylosis.

Other neck: No adenopathy.  No soft tissue mass.

Upper chest: Please see concurrent CTA chest for findings below the
thoracic inlet.

Review of the MIP images confirms the above findings

CTA HEAD FINDINGS

Anterior circulation: Patent ICAs. Bilateral carotid siphon
atherosclerotic calcifications without significant narrowing. Patent
MCAs. Right A1 segment hypoplasia. Patent ACAs. Patent basilar and
proximal superior cerebellar arteries. Patent posterior cerebral
arteries. Mild narrowing of the left P2 segment.

Posterior circulation: Patent right vertebral artery. Retrograde
filling of the distal left V4 segment.

Venous sinuses: As permitted by contrast timing, patent.

Anatomic variants: Please see above.

Review of the MIP images confirms the above findings
IMPRESSION: Non opacification of the left vertebral artery with retrograde
filling of the distal left V4 segment.

Bilateral carotid bifurcation atheromatous disease with 50% right
and 30% left carotid bulb narrowing.

Mild left P2 segment narrowing.

Sub optimal contrast bolus timing limits evaluation.

Code stroke imaging results were communicated on [DATE] at [DATE] to provider Dr. GARNET via secure text paging.

## 2020-12-17 IMAGING — MR MR MRA HEAD W/O CM
3 series · 16 of 48 positions shown · non-contrast
Comparison: Prior CTs and arteriogram from earlier same day.

CLINICAL DATA: Follow-up examination for acute stroke, status post
catheter directed arteriogram.

EXAM:
MRI HEAD WITHOUT CONTRAST
MRA HEAD WITHOUT CONTRAST
TECHNIQUE: Multiplanar, multiecho pulse sequences of the brain and surrounding
structures were obtained without intravenous contrast. Angiographic
images of the head were obtained using MRA technique without
contrast.

[Series 2: ax (id) · axial · 1.0mm · 0.43mm/px · z∈[-35,+51]mm · 11 of 193 slices shown]
[im 10/193]
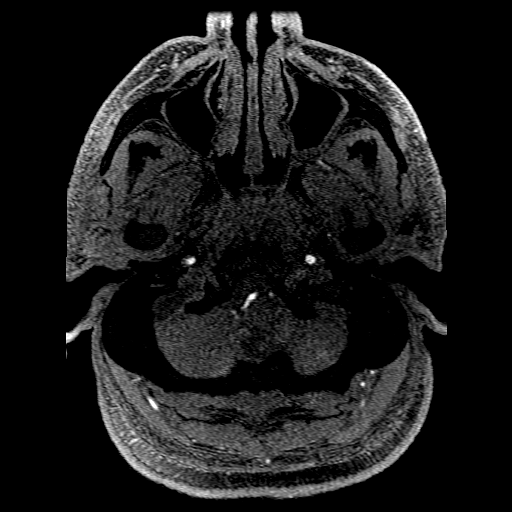
[im 33/193]
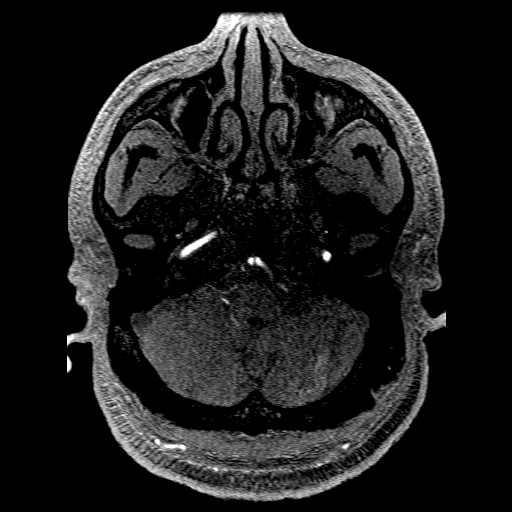
[im 37/193]
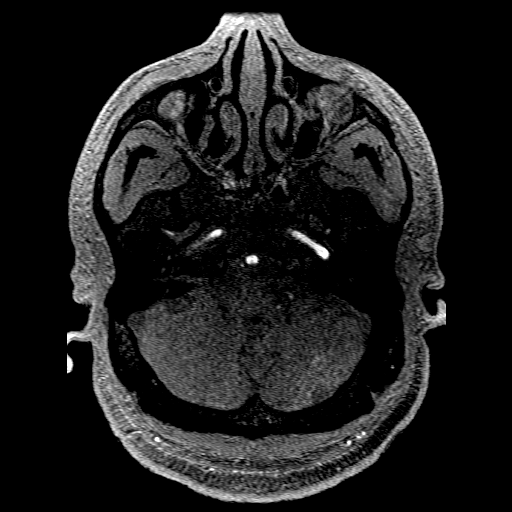
[im 60/193]
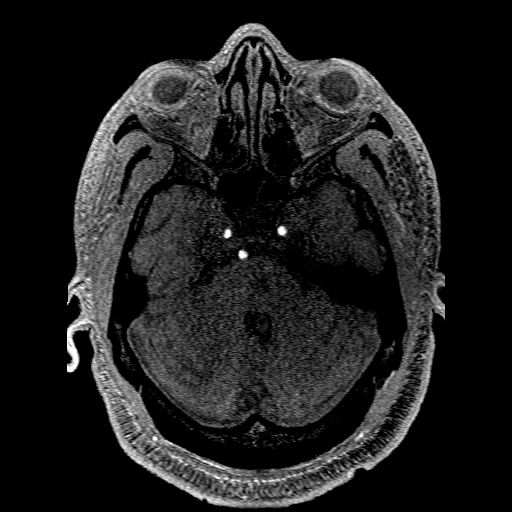
[im 83/193]
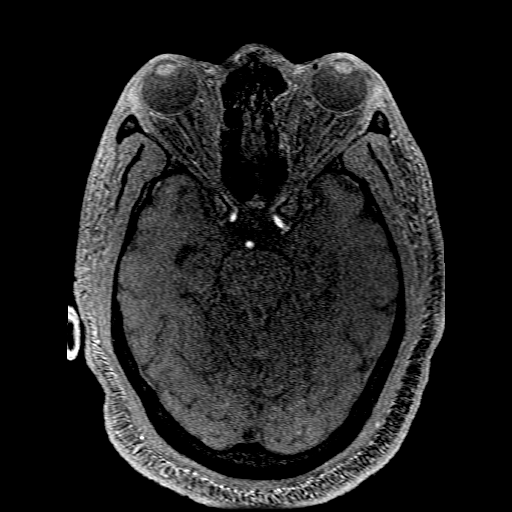
[im 97/193]
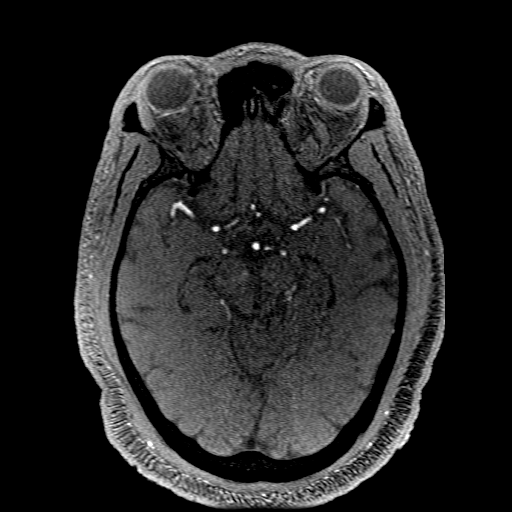
[im 110/193]
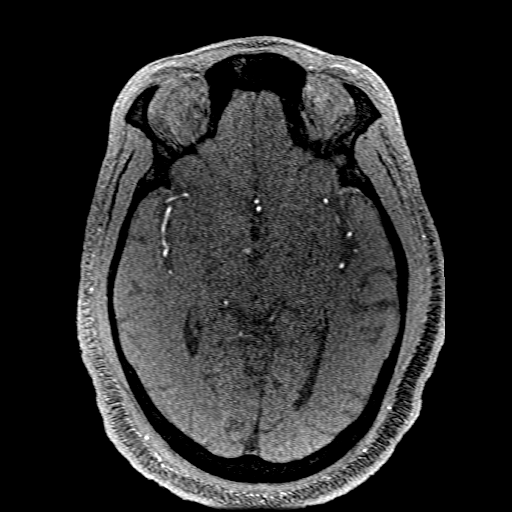
[im 133/193]
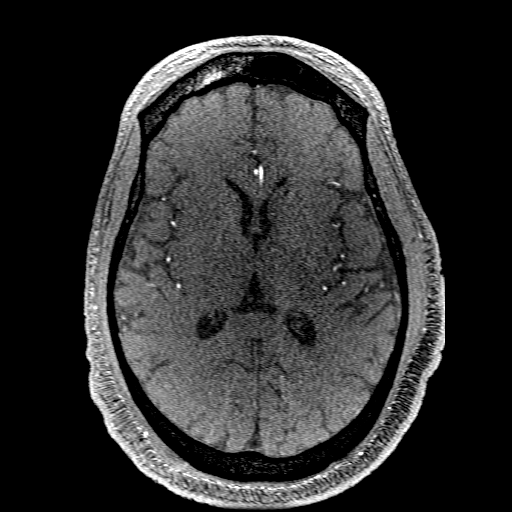
[im 156/193]
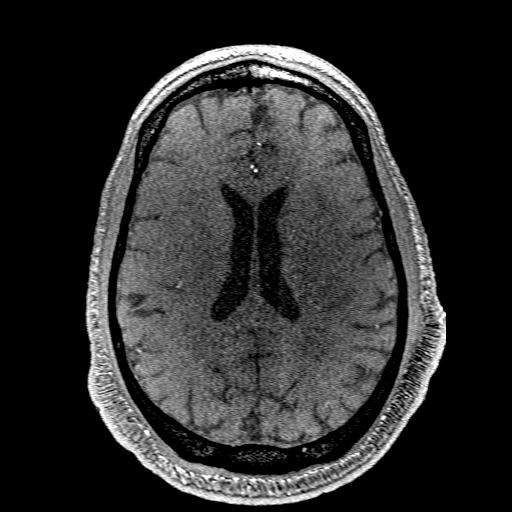
[im 161/193]
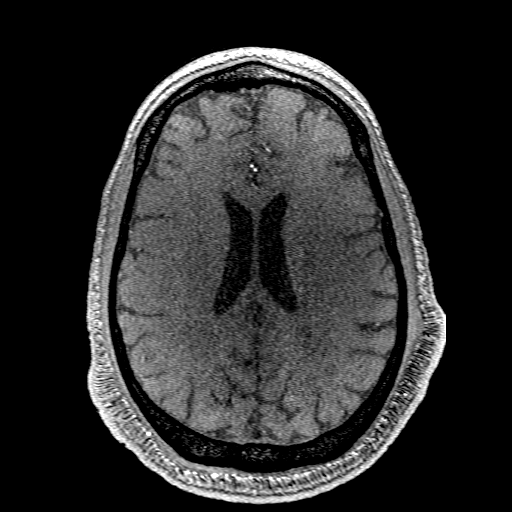
[im 183/193]
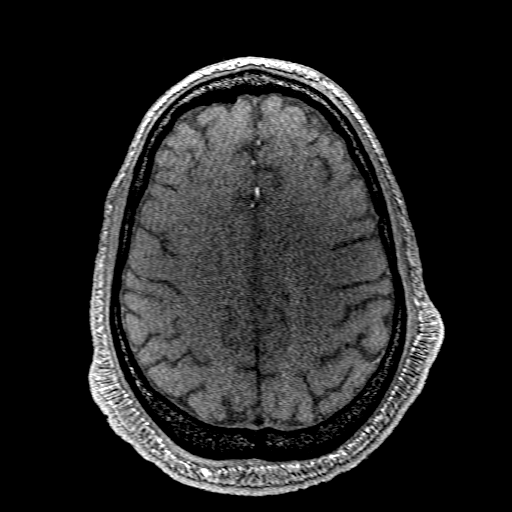

[Series 200: col:ax (id) · axial · 1.0mm · 0.43mm/px · 1 of 1 slices shown]
[im 1/1]
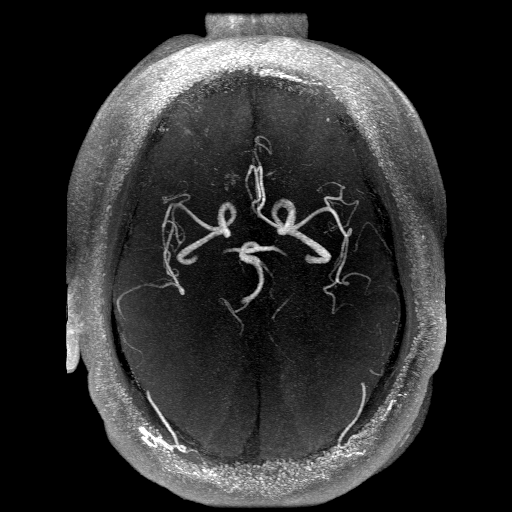

[Series 201: pjn:ax (id) · sagittal · 1.0mm · 0.43mm/px · 4 of 16 slices shown]
[im 1/16]
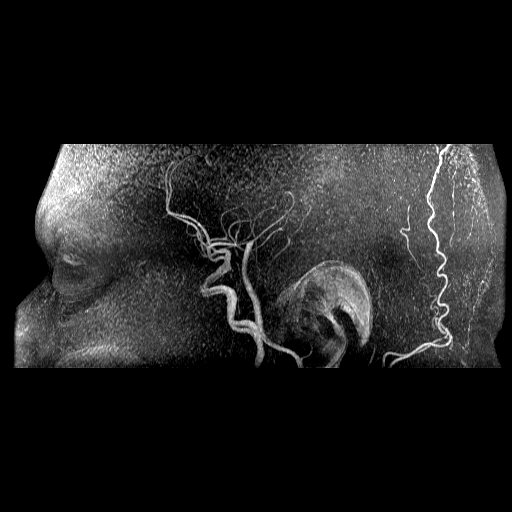
[im 6/16]
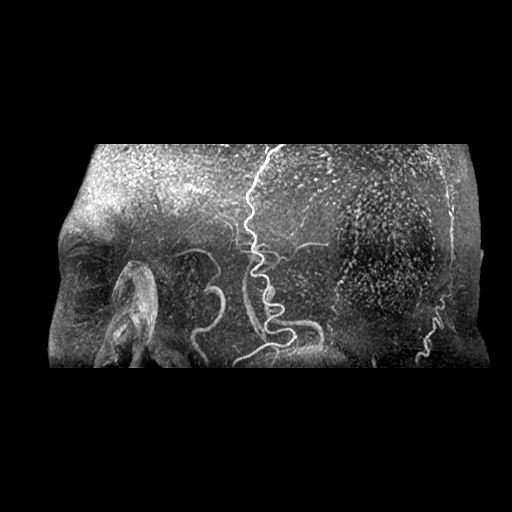
[im 11/16]
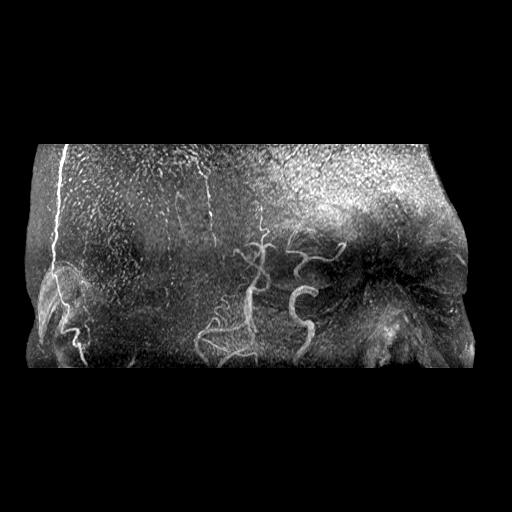
[im 16/16]
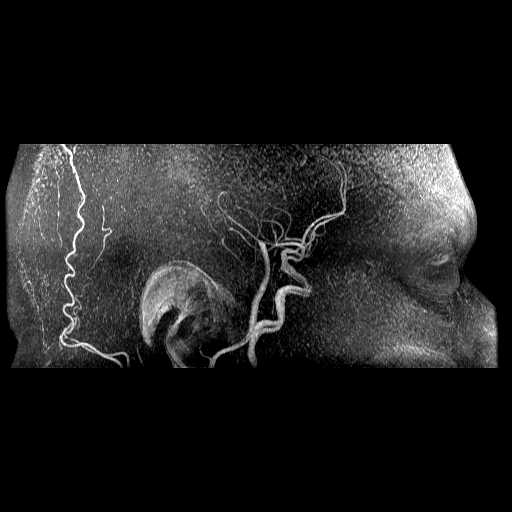

[16 of 48 positions shown; findings below may reference images not displayed]

FINDINGS: MRI HEAD FINDINGS

Brain: Examination mildly degraded by motion artifact.

Cerebral volume within normal limits. Patchy and confluent T2/FLAIR
hyperintensity within the periventricular and deep white matter both
cerebral hemispheres most consistent with chronic small vessel
ischemic disease, mild to moderate in nature. Encephalomalacia and
gliosis involving the mid and inferior left cerebellum without
residual diffusion abnormality, consistent with a chronic ischemic
infarct, left PICA distribution.

Area of confluent restricted diffusion measuring approximately 4.1 x
3.1 cm seen involving the superior right cerebellum, consistent with
an acute ischemic infarct, right superior cerebellar artery
territory (series 2, image 13). No associated hemorrhage or
significant regional mass effect. No involvement of the adjacent
brainstem. No other diffusion abnormality to suggest acute or
subacute ischemia elsewhere within the brain. Gray-white matter
differentiation otherwise maintained. No other areas of remote
cortical infarction. No evidence for acute intracranial hemorrhage.
Single punctate focus of susceptibility artifact noted at the
superior left frontal lobe (series 7, image 75), likely a small
chronic microhemorrhage, of doubtful significance in isolation.

No mass lesion, midline shift or mass effect. Ventricles normal size
without hydrocephalus. No extra-axial fluid collection. Pituitary
gland and suprasellar region within normal limits. Midline
structures intact.

Vascular: Abnormal flow void within the left V4 segment, which could
be related to slow flow and/or occlusion. Major intracranial
vascular flow voids are otherwise maintained.

Skull and upper cervical spine: Craniocervical junction within
normal limits. Upper cervical spine normal. Bone marrow signal
intensity within normal limits. No scalp soft tissue abnormality.

Sinuses/Orbits: Globes and orbital soft tissues within normal
limits. Scattered mucosal thickening noted throughout the paranasal
sinuses. Layering fluid noted within the nasopharynx. Orogastric
tube partially visualized. Mastoid air cells remain clear. Inner ear
structures grossly normal.

Other: None.

MRA HEAD FINDINGS

ANTERIOR CIRCULATION:

Visualized distal cervical segments of the internal carotid arteries
are patent with antegrade flow. Petrous, cavernous, and supraclinoid
segments widely patent without stenosis or other abnormality. A1
segments patent bilaterally. Right A1 hypoplastic, accounting for
the diminutive right ICA seen prior to the left. Normal anterior
communicating artery complex. Both anterior cerebral arteries widely
patent to their distal aspects without stenosis. No M1 stenosis or
occlusion. Normal left MCA bifurcation. Right MCA trifurcates.
Distal MCA branches well perfused and symmetric bilaterally.

POSTERIOR CIRCULATION:

Dominant right V4 segment patent to the vertebrobasilar junction
without stenosis. Right PICA origin patent and normal. Attenuated
but patent flow within the left V4 segment, which could reflect a
proximal stenosis. Left PICA origin patent and normal. Short
fenestration involving the proximal basilar artery noted. Basilar
patent to its distal aspect without stenosis. Dominant right AICA.
Left superior cerebellar artery patent. Right superior cerebellar
artery is occluded, presumably acute in nature given the acute right
ACA territory infarct. Both PCAs primarily supplied via the basilar.
Small bilateral posterior communicating arteries noted. Visualized
portions of the PCAs are patent to their distal aspects without
stenosis.

No aneurysm.
IMPRESSION: MRI HEAD IMPRESSION:

1. Acute ischemic nonhemorrhagic right superior cerebellar artery
territory infarct. No associated mass effect.
2. Chronic left PICA distribution infarct. Abnormal flow void within
the left V4 segment consistent with slow flow as seen on
corresponding MRA.
3. Underlying mild-to-moderate chronic microvascular ischemic
disease.

MRA HEAD IMPRESSION:

1. Acute occlusion of the right superior cerebellar artery.
2. Attenuated but patent flow within the left V4 segment, suggesting
a proximal stenosis.
3. Otherwise wide patency of the major intracranial arterial
circulation. No other hemodynamically significant or correctable
stenosis.

## 2020-12-17 IMAGING — MR MR HEAD W/O CM
6 of 11 series · 24 of 48 positions shown · non-contrast
Comparison: Prior CTs and arteriogram from earlier same day.

CLINICAL DATA: Follow-up examination for acute stroke, status post
catheter directed arteriogram.

EXAM:
MRI HEAD WITHOUT CONTRAST
MRA HEAD WITHOUT CONTRAST
TECHNIQUE: Multiplanar, multiecho pulse sequences of the brain and surrounding
structures were obtained without intravenous contrast. Angiographic
images of the head were obtained using MRA technique without
contrast.

[Series 2: DWI · axial · 3.0mm · 0.94mm/px · z∈[-53,+93]mm · 7 of 100 slices shown (1 of 2)]
[im 1/100]
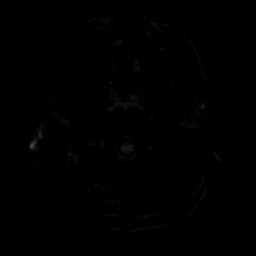
[im 17/100]
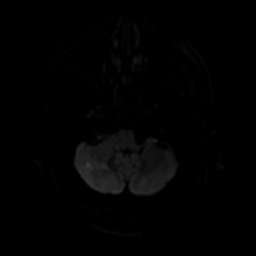
[im 34/100]
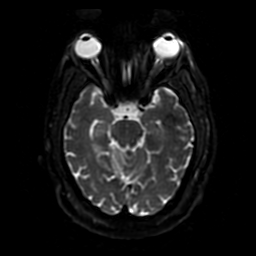
[im 50/100]
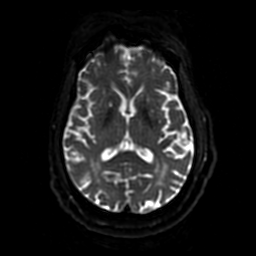
[im 67/100]
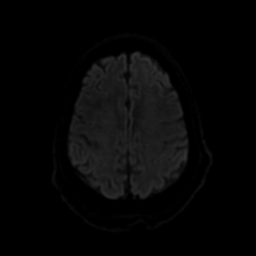
[im 83/100]
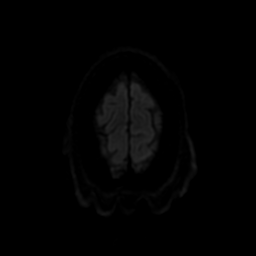
[im 100/100]
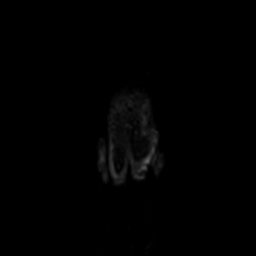

[Series 3: DWI · coronal · 4.0mm · 0.94mm/px · 6 of 76 slices shown (2 of 2)]
[im 1/76]
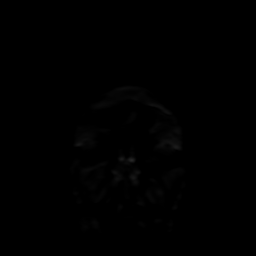
[im 16/76]
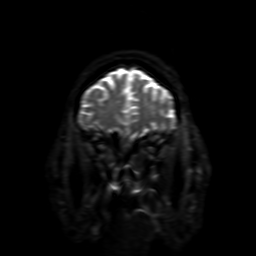
[im 31/76]
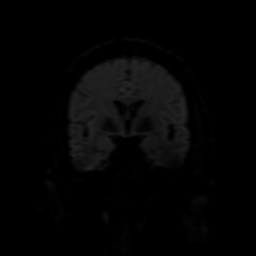
[im 46/76]
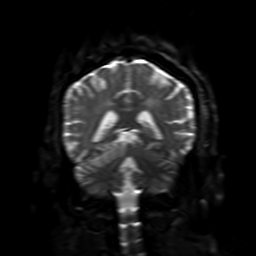
[im 61/76]
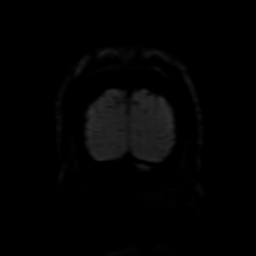
[im 76/76]
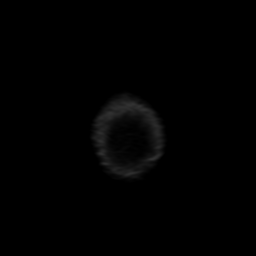

[Series 4: FLAIR · sagittal · 5.0mm · 0.23mm/px · 2 of 26 slices shown (1 of 2)]
[im 1/26]
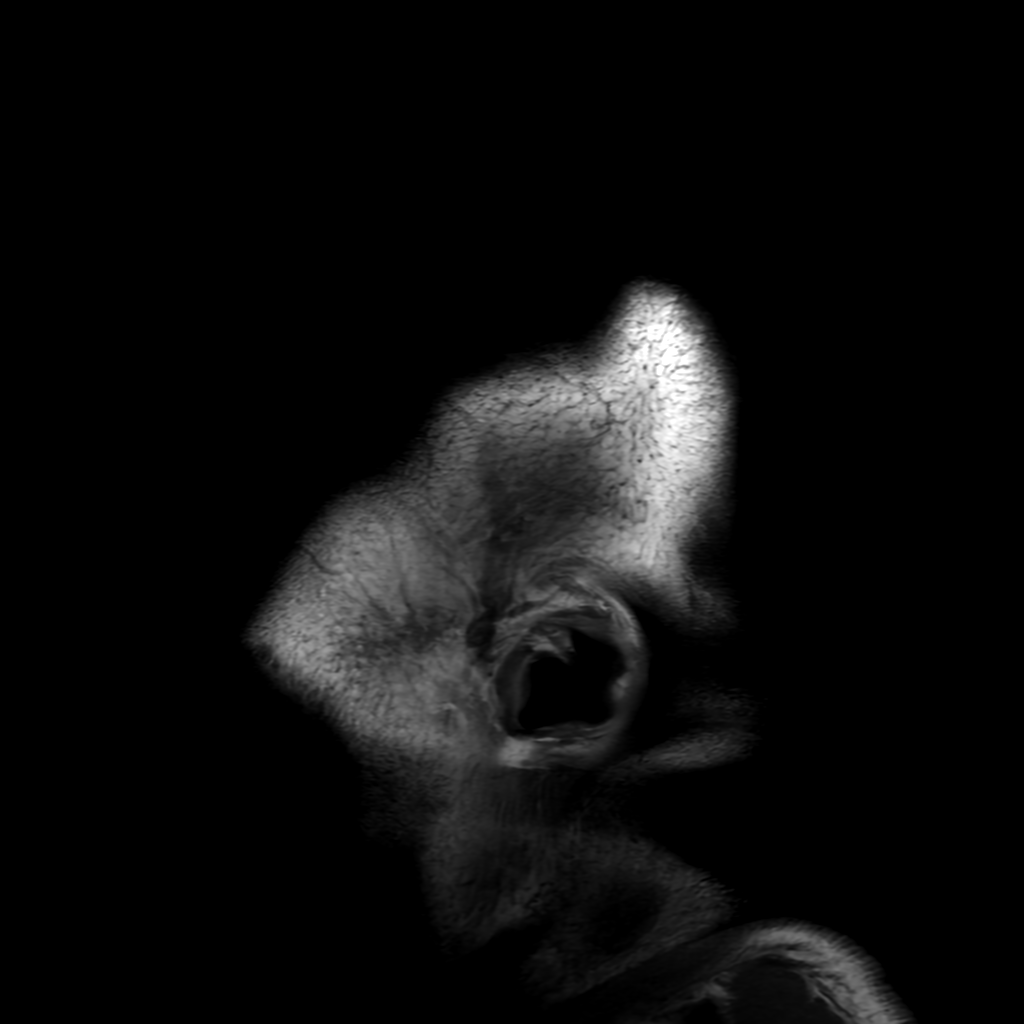
[im 26/26]
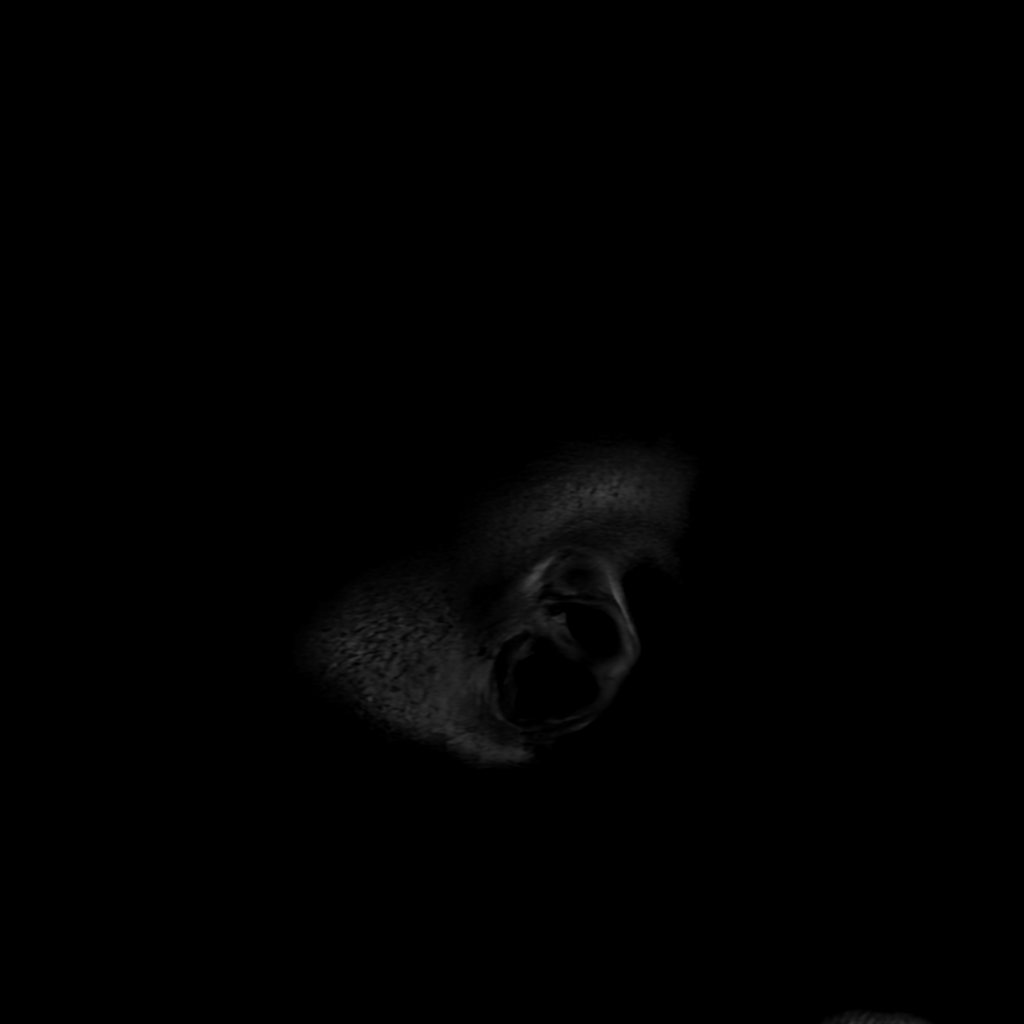

[Series 6: FLAIR · axial · 3.0mm · 0.45mm/px · z∈[-53,+95]mm · 2 of 26 slices shown (2 of 2)]
[im 1/26]
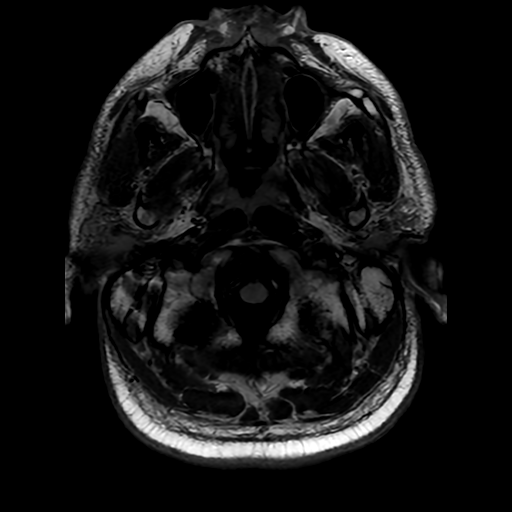
[im 26/26]
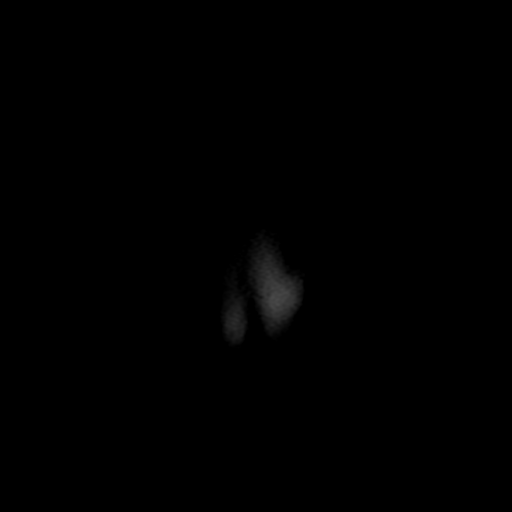

[Series 250: ADC · axial · 3.0mm · 0.94mm/px · z∈[-53,+93]mm · 4 of 49 slices shown (1 of 2)]
[im 1/49]
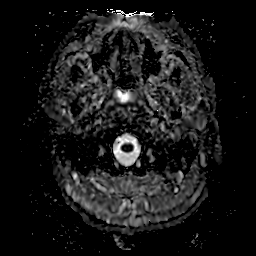
[im 17/49]
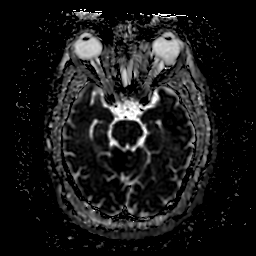
[im 33/49]
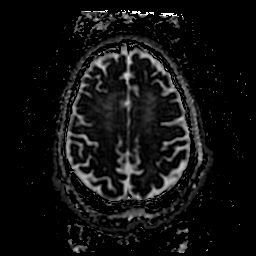
[im 49/49]
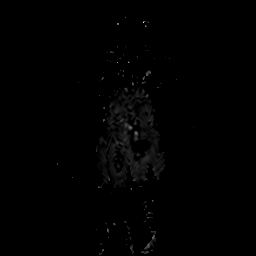

[Series 350: ADC · coronal · 4.0mm · 0.94mm/px · 3 of 38 slices shown (2 of 2)]
[im 1/38]
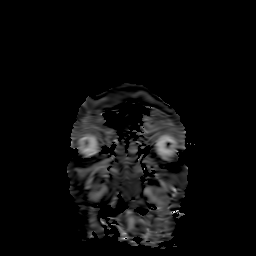
[im 19/38]
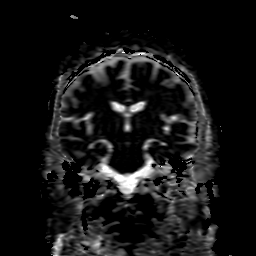
[im 38/38]
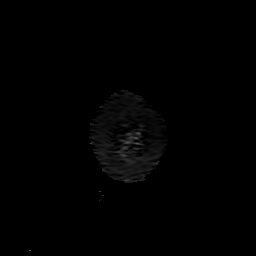

[24 of 48 positions shown; findings below may reference images not displayed]

FINDINGS: MRI HEAD FINDINGS

Brain: Examination mildly degraded by motion artifact.

Cerebral volume within normal limits. Patchy and confluent T2/FLAIR
hyperintensity within the periventricular and deep white matter both
cerebral hemispheres most consistent with chronic small vessel
ischemic disease, mild to moderate in nature. Encephalomalacia and
gliosis involving the mid and inferior left cerebellum without
residual diffusion abnormality, consistent with a chronic ischemic
infarct, left PICA distribution.

Area of confluent restricted diffusion measuring approximately 4.1 x
3.1 cm seen involving the superior right cerebellum, consistent with
an acute ischemic infarct, right superior cerebellar artery
territory (series 2, image 13). No associated hemorrhage or
significant regional mass effect. No involvement of the adjacent
brainstem. No other diffusion abnormality to suggest acute or
subacute ischemia elsewhere within the brain. Gray-white matter
differentiation otherwise maintained. No other areas of remote
cortical infarction. No evidence for acute intracranial hemorrhage.
Single punctate focus of susceptibility artifact noted at the
superior left frontal lobe (series 7, image 75), likely a small
chronic microhemorrhage, of doubtful significance in isolation.

No mass lesion, midline shift or mass effect. Ventricles normal size
without hydrocephalus. No extra-axial fluid collection. Pituitary
gland and suprasellar region within normal limits. Midline
structures intact.

Vascular: Abnormal flow void within the left V4 segment, which could
be related to slow flow and/or occlusion. Major intracranial
vascular flow voids are otherwise maintained.

Skull and upper cervical spine: Craniocervical junction within
normal limits. Upper cervical spine normal. Bone marrow signal
intensity within normal limits. No scalp soft tissue abnormality.

Sinuses/Orbits: Globes and orbital soft tissues within normal
limits. Scattered mucosal thickening noted throughout the paranasal
sinuses. Layering fluid noted within the nasopharynx. Orogastric
tube partially visualized. Mastoid air cells remain clear. Inner ear
structures grossly normal.

Other: None.

MRA HEAD FINDINGS

ANTERIOR CIRCULATION:

Visualized distal cervical segments of the internal carotid arteries
are patent with antegrade flow. Petrous, cavernous, and supraclinoid
segments widely patent without stenosis or other abnormality. A1
segments patent bilaterally. Right A1 hypoplastic, accounting for
the diminutive right ICA seen prior to the left. Normal anterior
communicating artery complex. Both anterior cerebral arteries widely
patent to their distal aspects without stenosis. No M1 stenosis or
occlusion. Normal left MCA bifurcation. Right MCA trifurcates.
Distal MCA branches well perfused and symmetric bilaterally.

POSTERIOR CIRCULATION:

Dominant right V4 segment patent to the vertebrobasilar junction
without stenosis. Right PICA origin patent and normal. Attenuated
but patent flow within the left V4 segment, which could reflect a
proximal stenosis. Left PICA origin patent and normal. Short
fenestration involving the proximal basilar artery noted. Basilar
patent to its distal aspect without stenosis. Dominant right AICA.
Left superior cerebellar artery patent. Right superior cerebellar
artery is occluded, presumably acute in nature given the acute right
ACA territory infarct. Both PCAs primarily supplied via the basilar.
Small bilateral posterior communicating arteries noted. Visualized
portions of the PCAs are patent to their distal aspects without
stenosis.

No aneurysm.
IMPRESSION: MRI HEAD IMPRESSION:

1. Acute ischemic nonhemorrhagic right superior cerebellar artery
territory infarct. No associated mass effect.
2. Chronic left PICA distribution infarct. Abnormal flow void within
the left V4 segment consistent with slow flow as seen on
corresponding MRA.
3. Underlying mild-to-moderate chronic microvascular ischemic
disease.

MRA HEAD IMPRESSION:

1. Acute occlusion of the right superior cerebellar artery.
2. Attenuated but patent flow within the left V4 segment, suggesting
a proximal stenosis.
3. Otherwise wide patency of the major intracranial arterial
circulation. No other hemodynamically significant or correctable
stenosis.

## 2020-12-17 IMAGING — XA IR ANGIO INTRA EXTRACRAN SEL COM CAROTID INNOMINATE BILAT MOD SE
1 series · 13 of 24 positions shown · IV contrast (IODINE)
Comparison: CT angiogram of the head and neck [DATE].

CLINICAL DATA: New onset left gaze deviation, left facial droop,
with right-sided weakness and change in mental status. Suboptimal CT
angiogram of the head and neck.

EXAM:
BILATERAL COMMON CAROTID AND INNOMINATE ANGIOGRAPHY
TECHNIQUE: A 2 physician consent was obtained as no family members, next of
AGUEP, or AGUEP representatives were available. Maximal Sterile
Barrier Technique was utilized including caps, mask, sterile gowns,
sterile gloves, sterile drape, hand hygiene and skin antiseptic. A
timeout was performed prior to the initiation of the procedure.

[Series 300: dsa body · 13 of 171 slices shown]
[im 1/171]
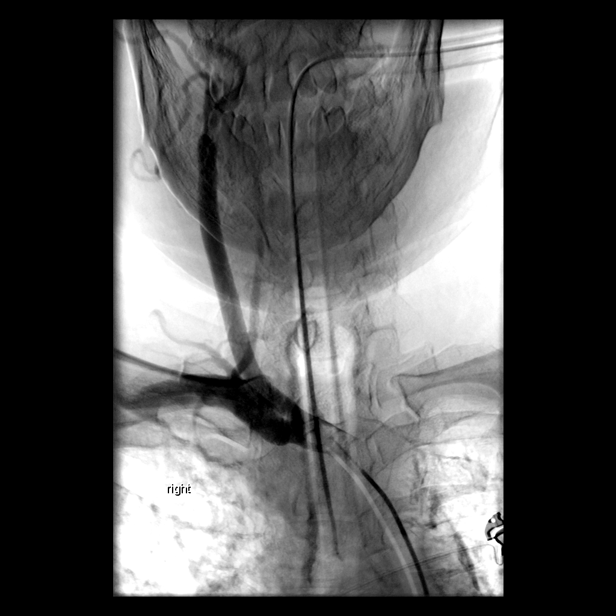
[im 15/171]
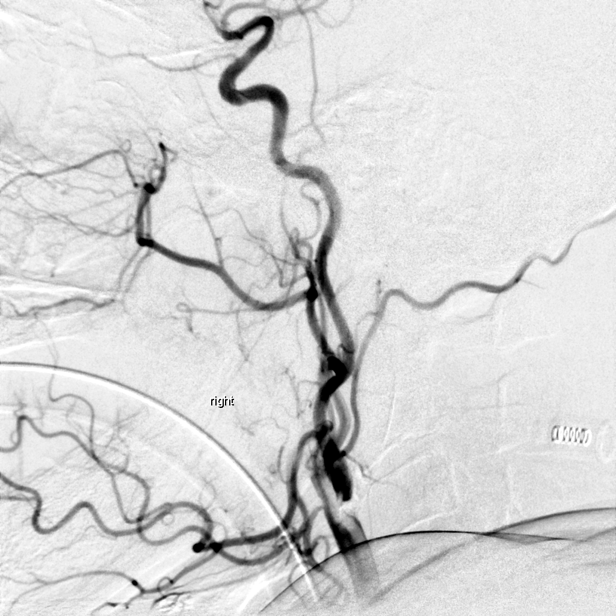
[im 30/171]
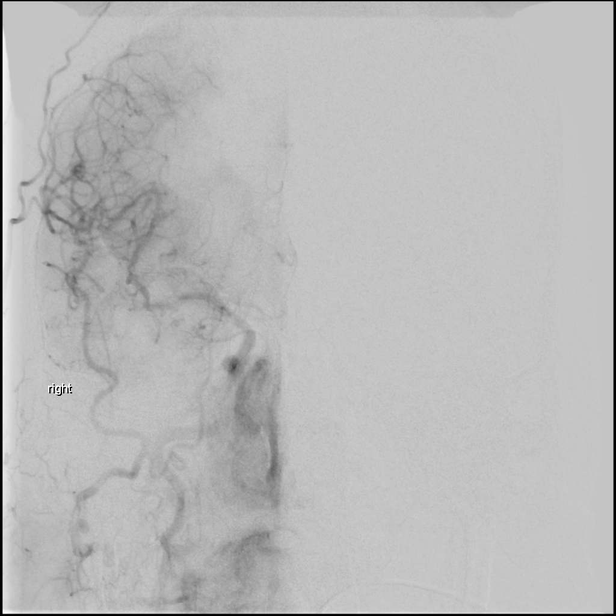
[im 45/171]
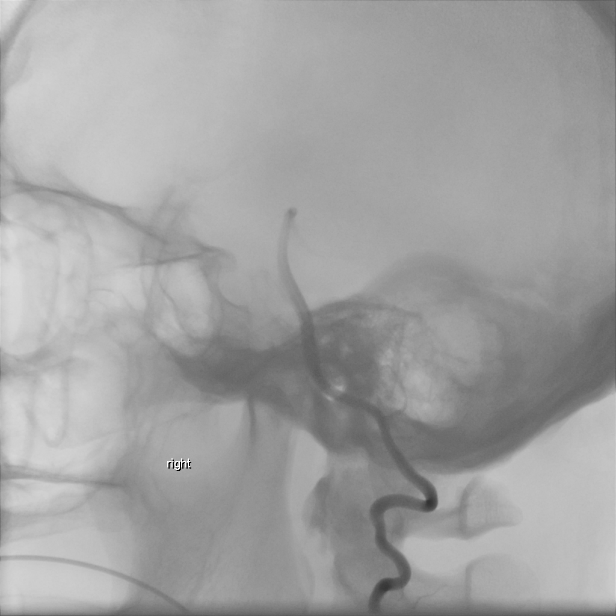
[im 60/171]
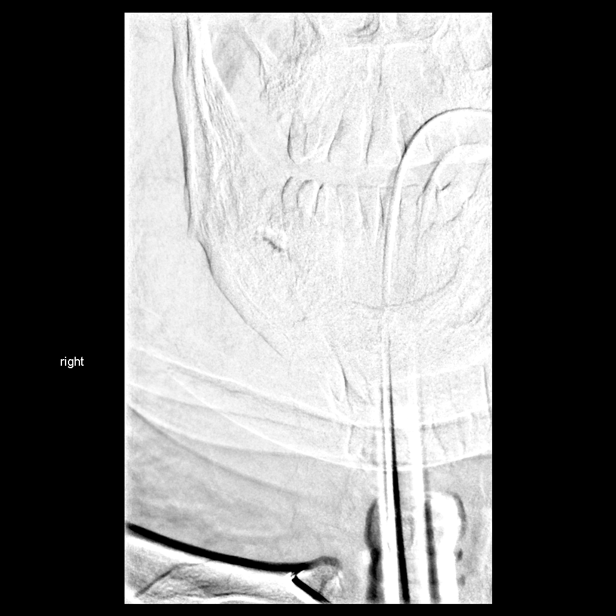
[im 74/171]
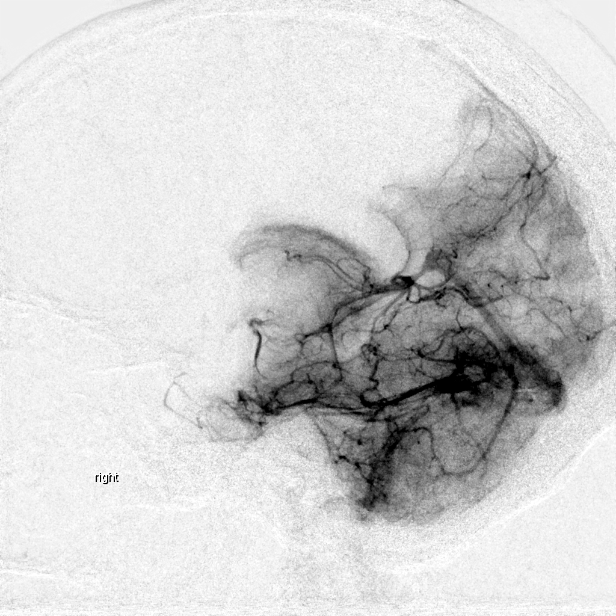
[im 89/171]
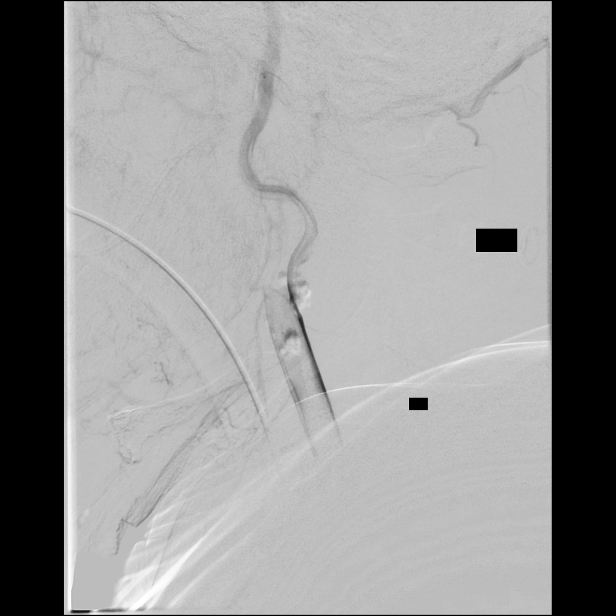
[im 97/171]
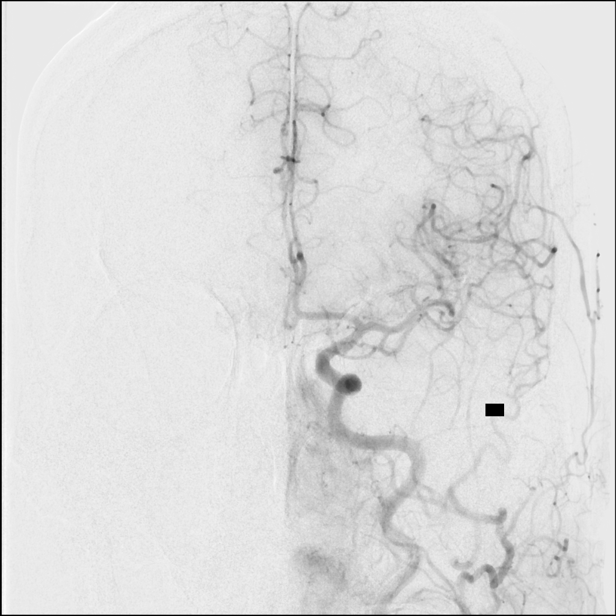
[im 111/171]
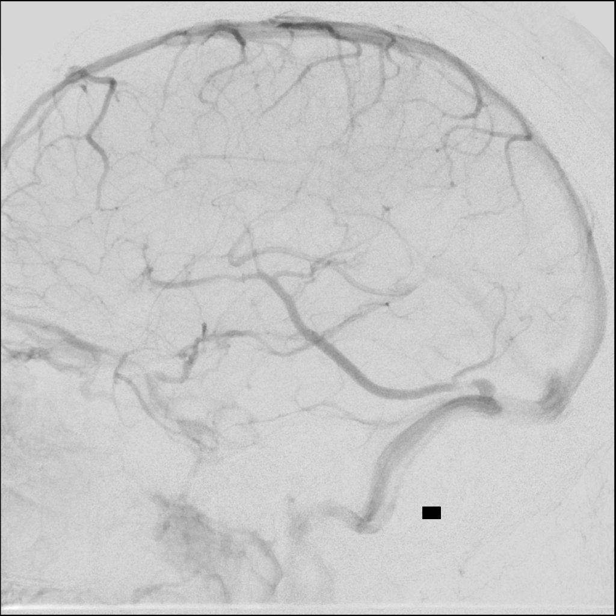
[im 126/171]
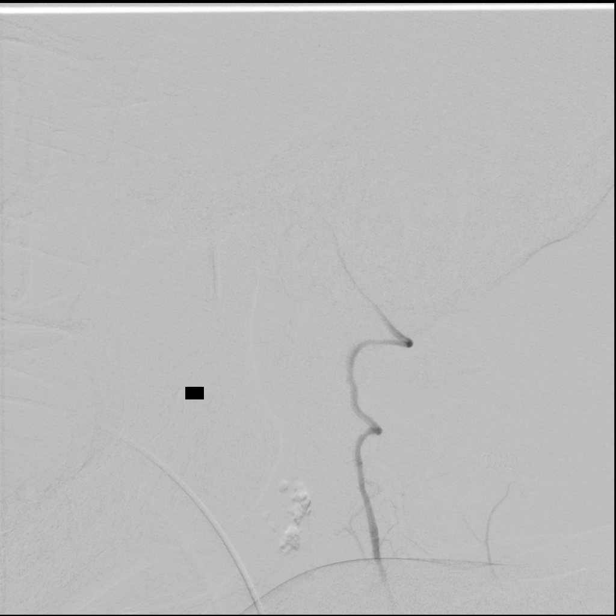
[im 141/171]
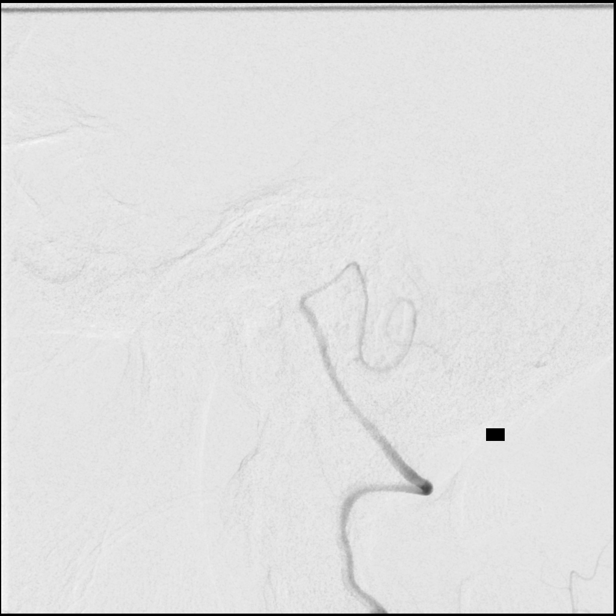
[im 156/171]
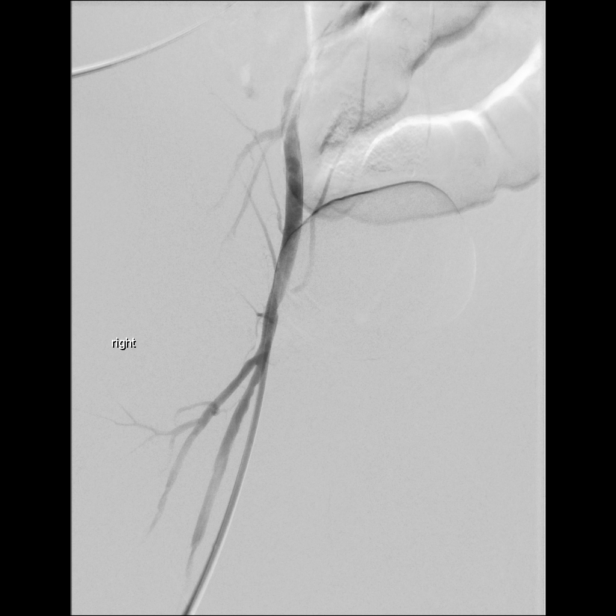
[im 171/171]
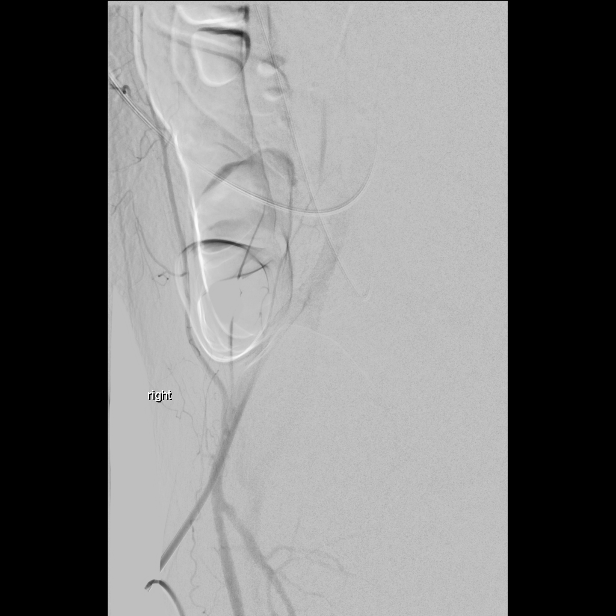

[13 of 24 positions shown; findings below may reference images not displayed]

MEDICATIONS:
Heparin: none. Ancef 2 g IV antibiotic was administered within 1
hour of the procedure.

ANESTHESIA/SEDATION:
General anesthesia

CONTRAST:  Isovue 300 65 mL

FLUOROSCOPY TIME:  Fluoroscopy Time: 4.6 minutes ([TW] mGy).

COMPLICATIONS:
None immediate.
The right groin was prepped and draped in the usual sterile fashion.
Thereafter using modified Seldinger technique, transfemoral access
into the right common femoral artery was obtained without
difficulty. Over a 0.035 inch guidewire, an 8 French Pinnacle 25 cm
sheath was inserted. Through this, and also over 0.035 inch
guidewire, a 5 French JB 1 catheter was advanced to the aortic arch
region and selectively positioned in the right common carotid
artery, the right vertebral artery, the left common carotid artery
and the left vertebral artery.
FINDINGS: The right common carotid arteriogram demonstrates the right external
carotid artery and its major branches to be widely patent.

The right internal carotid artery demonstrates a pre occlusive
stenosis secondary to a smooth circumferential plaque. No evidence
of intraluminal filling defects or of ulcerations was seen. The
vessel, otherwise, ascends normally to the cranial skull base. The
petrous, the cavernous and the supraclinoid segments are widely
patent.

The right middle cerebral artery opacifies into the capillary and
venous phases. The right anterior cerebral artery A1 A2 segments are
hypoplastic on a developmental basis. The right vertebral artery
origin is widely patent.

The vessel opacifies to the cranial skull base. Wide patency is
maintained of the right vertebrobasilar junction and the right
posterior-inferior cerebellar artery. The basilar artery, the
posterior cerebral arteries, the left superior cerebellar artery and
the anterior-inferior cerebellar arteries opacify into the capillary
and venous phases.

The right superior cerebellar artery is occluded in its mid to
distal [DATE].

Cross-filling to the left vertebrobasilar junction to supply the
left posteroinferior cerebellar artery/anterior inferior cerebellar
artery complex is seen.

The left common carotid arteriogram demonstrates the left external
carotid artery and its major branches to be widely patent.

The left internal carotid artery at the bulb demonstrates
approximately 30% stenosis by the NASCET criteria secondary to a
smooth calcified posterior wall plaque. No evidence of ulcerations
or of intraluminal filling defects are seen.

The vessel is, otherwise, seen to opacify to the cranial skull base.
The petrous, cavernous and the supraclinoid segments are widely
patent.

The left middle cerebral artery and the left anterior cerebral
artery opacify into the capillary and venous phases. Prompt
cross-filling via the anterior communicating artery of the right
anterior cerebral A2 segment and distally is demonstrated.

Hypoplastic left vertebral artery origin demonstrates approximately
50% stenosis. Distal to this it opacifies to the cranial skull base
ending primarily in the left posterior-inferior cerebellar artery
complex.
IMPRESSION: Severe high-grade pre occlusive stenosis of the right internal
carotid artery at the bulb.

Angiographic occlusion of the right superior cerebellar artery in
its mid to distal [DATE].

Approximately 30% stenosis of the left internal carotid artery at
the bulb by the NASCET criteria.

Approximately 50% stenosis of the left non dominant vertebral artery
at its origin.

PLAN:
As per referring AGUEP.

## 2020-12-17 SURGERY — IR WITH ANESTHESIA
Anesthesia: General

## 2020-12-17 MED ORDER — IOHEXOL 240 MG/ML SOLN
INTRAMUSCULAR | Status: AC
  Filled 2020-12-17: qty 200

## 2020-12-17 MED ORDER — SUCCINYLCHOLINE CHLORIDE 200 MG/10ML IV SOSY
PREFILLED_SYRINGE | INTRAVENOUS | Status: DC | PRN
  Administered 2020-12-17: 140 mg via INTRAVENOUS

## 2020-12-17 MED ORDER — ACETAMINOPHEN 160 MG/5ML PO SOLN: 650.0000 mg | ORAL | Status: DC | PRN

## 2020-12-17 MED ORDER — IOHEXOL 300 MG/ML  SOLN
150.0000 mL | Freq: Once | INTRAMUSCULAR | Status: AC | PRN
  Administered 2020-12-17: 65 mL via INTRA_ARTERIAL

## 2020-12-17 MED ORDER — POLYETHYLENE GLYCOL 3350 17 G PO PACK
17.0000 g | PACK | Freq: Every day | ORAL | Status: DC
  Administered 2020-12-18 – 2021-01-24 (×20): 17 g
  Filled 2020-12-17 (×29): qty 1

## 2020-12-17 MED ORDER — FENTANYL CITRATE (PF) 100 MCG/2ML IJ SOLN
INTRAMUSCULAR | Status: AC
  Filled 2020-12-17: qty 2

## 2020-12-17 MED ORDER — PROPOFOL 500 MG/50ML IV EMUL
INTRAVENOUS | Status: DC | PRN
  Administered 2020-12-17: 75 ug/kg/min via INTRAVENOUS

## 2020-12-17 MED ORDER — ASPIRIN 81 MG PO CHEW
81.0000 mg | CHEWABLE_TABLET | Freq: Every day | ORAL | Status: DC
  Administered 2020-12-18 – 2020-12-20 (×3): 81 mg
  Filled 2020-12-17 (×3): qty 1

## 2020-12-17 MED ORDER — CEFAZOLIN SODIUM-DEXTROSE 2-3 GM-%(50ML) IV SOLR
INTRAVENOUS | Status: DC | PRN
  Administered 2020-12-17: 2 g via INTRAVENOUS

## 2020-12-17 MED ORDER — LORAZEPAM 2 MG/ML IJ SOLN
1.0000 mg | Freq: Once | INTRAMUSCULAR | Status: DC
  Filled 2020-12-17: qty 1

## 2020-12-17 MED ORDER — TICAGRELOR 90 MG PO TABS
ORAL_TABLET | ORAL | Status: AC
  Filled 2020-12-17: qty 2

## 2020-12-17 MED ORDER — IOHEXOL 350 MG/ML SOLN
100.0000 mL | Freq: Once | INTRAVENOUS | Status: AC | PRN
  Administered 2020-12-17: 100 mL via INTRAVENOUS

## 2020-12-17 MED ORDER — LORAZEPAM 2 MG/ML IJ SOLN
INTRAMUSCULAR | Status: AC
  Filled 2020-12-17: qty 1

## 2020-12-17 MED ORDER — ACETAMINOPHEN 325 MG PO TABS
650.0000 mg | ORAL_TABLET | ORAL | Status: DC | PRN
  Administered 2020-12-23 – 2021-01-07 (×2): 650 mg via ORAL
  Filled 2020-12-17 (×6): qty 2

## 2020-12-17 MED ORDER — ACETAMINOPHEN 325 MG PO TABS: 650.0000 mg | ORAL_TABLET | ORAL | Status: DC | PRN

## 2020-12-17 MED ORDER — FENTANYL CITRATE (PF) 100 MCG/2ML IJ SOLN
50.0000 ug | INTRAMUSCULAR | Status: DC | PRN
  Administered 2020-12-17 (×3): 100 ug via INTRAVENOUS
  Administered 2020-12-17: 200 ug via INTRAVENOUS
  Administered 2020-12-18 – 2020-12-19 (×6): 100 ug via INTRAVENOUS
  Filled 2020-12-17 (×2): qty 2
  Filled 2020-12-17: qty 4
  Filled 2020-12-17 (×7): qty 2

## 2020-12-17 MED ORDER — CLOPIDOGREL BISULFATE 300 MG PO TABS
ORAL_TABLET | ORAL | Status: AC
  Filled 2020-12-17: qty 1

## 2020-12-17 MED ORDER — SODIUM CHLORIDE 0.9 % IV SOLN: INTRAVENOUS | Status: DC

## 2020-12-17 MED ORDER — FENTANYL CITRATE (PF) 100 MCG/2ML IJ SOLN
50.0000 ug | INTRAMUSCULAR | Status: DC | PRN
  Administered 2020-12-20 (×2): 50 ug via INTRAVENOUS
  Filled 2020-12-17: qty 2

## 2020-12-17 MED ORDER — SODIUM CHLORIDE 0.9 % IV SOLN: INTRAVENOUS | Status: DC | PRN

## 2020-12-17 MED ORDER — CANGRELOR TETRASODIUM 50 MG IV SOLR
INTRAVENOUS | Status: AC
  Filled 2020-12-17: qty 50

## 2020-12-17 MED ORDER — ROCURONIUM BROMIDE 10 MG/ML (PF) SYRINGE
PREFILLED_SYRINGE | INTRAVENOUS | Status: DC | PRN
  Administered 2020-12-17: 50 mg via INTRAVENOUS

## 2020-12-17 MED ORDER — ASPIRIN 81 MG PO CHEW: 81.0000 mg | CHEWABLE_TABLET | Freq: Every day | ORAL | Status: DC

## 2020-12-17 MED ORDER — PROPOFOL 10 MG/ML IV BOLUS
INTRAVENOUS | Status: DC | PRN
  Administered 2020-12-17: 110 mg via INTRAVENOUS

## 2020-12-17 MED ORDER — EPTIFIBATIDE 20 MG/10ML IV SOLN
INTRAVENOUS | Status: AC
  Filled 2020-12-17: qty 10

## 2020-12-17 MED ORDER — TIROFIBAN HCL IN NACL 5-0.9 MG/100ML-% IV SOLN
INTRAVENOUS | Status: AC
  Filled 2020-12-17: qty 100

## 2020-12-17 MED ORDER — FENTANYL CITRATE (PF) 100 MCG/2ML IJ SOLN
INTRAMUSCULAR | Status: DC | PRN
  Administered 2020-12-17: 100 ug via INTRAVENOUS

## 2020-12-17 MED ORDER — ONDANSETRON HCL 4 MG/2ML IJ SOLN: 4.0000 mg | Freq: Four times a day (QID) | INTRAMUSCULAR | Status: DC | PRN

## 2020-12-17 MED ORDER — ATORVASTATIN CALCIUM 40 MG PO TABS
40.0000 mg | ORAL_TABLET | Freq: Every day | ORAL | Status: DC
  Administered 2020-12-17 – 2020-12-19 (×3): 40 mg
  Filled 2020-12-17 (×2): qty 1

## 2020-12-17 MED ORDER — PROPOFOL 1000 MG/100ML IV EMUL
0.0000 ug/kg/min | INTRAVENOUS | Status: DC
  Administered 2020-12-17: 40 ug/kg/min via INTRAVENOUS
  Administered 2020-12-17: 50 ug/kg/min via INTRAVENOUS
  Administered 2020-12-17: 40 ug/kg/min via INTRAVENOUS
  Administered 2020-12-18: 50 ug/kg/min via INTRAVENOUS
  Administered 2020-12-18 (×2): 30 ug/kg/min via INTRAVENOUS
  Administered 2020-12-18 (×2): 50 ug/kg/min via INTRAVENOUS
  Administered 2020-12-19: 35 ug/kg/min via INTRAVENOUS
  Administered 2020-12-19: 30 ug/kg/min via INTRAVENOUS
  Filled 2020-12-17 (×10): qty 100

## 2020-12-17 MED ORDER — VERAPAMIL HCL 2.5 MG/ML IV SOLN
INTRAVENOUS | Status: AC
  Filled 2020-12-17: qty 2

## 2020-12-17 MED ORDER — CEFAZOLIN SODIUM-DEXTROSE 2-4 GM/100ML-% IV SOLN
INTRAVENOUS | Status: AC
  Filled 2020-12-17: qty 100

## 2020-12-17 MED ORDER — CLEVIDIPINE BUTYRATE 0.5 MG/ML IV EMUL: 0.0000 mg/h | INTRAVENOUS | Status: DC

## 2020-12-17 MED ORDER — ENOXAPARIN SODIUM 40 MG/0.4ML ~~LOC~~ SOLN
40.0000 mg | SUBCUTANEOUS | Status: DC
  Administered 2020-12-18: 40 mg via SUBCUTANEOUS
  Filled 2020-12-17: qty 0.4

## 2020-12-17 MED ORDER — NITROGLYCERIN 1 MG/10 ML FOR IR/CATH LAB
INTRA_ARTERIAL | Status: AC
  Filled 2020-12-17: qty 10

## 2020-12-17 MED ORDER — CHLORHEXIDINE GLUCONATE 0.12% ORAL RINSE (MEDLINE KIT)
15.0000 mL | Freq: Two times a day (BID) | OROMUCOSAL | Status: DC
  Administered 2020-12-17 – 2020-12-20 (×6): 15 mL via OROMUCOSAL

## 2020-12-17 MED ORDER — PANTOPRAZOLE SODIUM 40 MG IV SOLR
40.0000 mg | Freq: Every day | INTRAVENOUS | Status: DC
  Administered 2020-12-17 – 2020-12-18 (×2): 40 mg via INTRAVENOUS
  Filled 2020-12-17 (×2): qty 40

## 2020-12-17 MED ORDER — ORAL CARE MOUTH RINSE
15.0000 mL | OROMUCOSAL | Status: DC
  Administered 2020-12-17 – 2020-12-20 (×24): 15 mL via OROMUCOSAL

## 2020-12-17 MED ORDER — ENOXAPARIN SODIUM 40 MG/0.4ML ~~LOC~~ SOLN: 40.0000 mg | SUBCUTANEOUS | Status: DC

## 2020-12-17 MED ORDER — DOCUSATE SODIUM 50 MG/5ML PO LIQD
100.0000 mg | Freq: Two times a day (BID) | ORAL | Status: DC
  Administered 2020-12-18 – 2020-12-20 (×5): 100 mg
  Filled 2020-12-17 (×5): qty 10

## 2020-12-17 MED ORDER — ACETAMINOPHEN 650 MG RE SUPP: 650.0000 mg | RECTAL | Status: DC | PRN

## 2020-12-17 MED ORDER — ASPIRIN 81 MG PO CHEW
CHEWABLE_TABLET | ORAL | Status: AC
  Filled 2020-12-17: qty 1

## 2020-12-17 MED ORDER — LIDOCAINE 2% (20 MG/ML) 5 ML SYRINGE
INTRAMUSCULAR | Status: DC | PRN
  Administered 2020-12-17: 80 mg via INTRAVENOUS

## 2020-12-17 MED ORDER — ATORVASTATIN CALCIUM 40 MG PO TABS
40.0000 mg | ORAL_TABLET | Freq: Every day | ORAL | Status: DC
  Filled 2020-12-17: qty 1

## 2020-12-17 NOTE — Transfer of Care (Signed)
Immediate Anesthesia Transfer of Care Note  Patient: [name]  Procedure(s) Performed: IR WITH ANESTHESIA (N/A )  Patient Location: ICU  Anesthesia Type:General  Level of Consciousness: unresponsive and Patient remains intubated per anesthesia plan  Airway & Oxygen Therapy: Patient remains intubated per anesthesia plan and Patient placed on Ventilator (see vital sign flow sheet for setting)  Post-op Assessment: Report given to RN and Post -op Vital signs reviewed and stable  Post vital signs: Reviewed and stable  Last Vitals:  Vitals Value Taken Time  BP 132/101 12/17/20 1548  Temp    Pulse 25 12/17/20 1554  Resp 13 12/17/20 1555  SpO2 67 % 12/17/20 1554  Vitals shown include unvalidated device data.  Last Pain:  Vitals:   12/17/20 1409  TempSrc:   PainSc: 0-No pain         Complications: No complications documented.

## 2020-12-17 NOTE — Progress Notes (Signed)
Patient is OTF at the time. Will attempt later as schedule permits.

## 2020-12-17 NOTE — ED Triage Notes (Signed)
Arrived [facility] EMS from homeless shelter. LWK 1230 this afternoon. A friend stated he c/o chest pain and when FD arrived he was unconscious. Patient arrived in ED, with initail glucose of 54; D50 and Narcan given w/ no changes. Patient is unable to follow commands, unresponsive w/ spontaneous respirations.

## 2020-12-17 NOTE — Anesthesia Preprocedure Evaluation (Signed)
Anesthesia Evaluation  Patient identified by MRN, date of birth, ID band Patient unresponsive  General Assessment Comment:Per ED notes: Male with an unknown name and DOB who presents to [facility] ED via EMS after chest pain complaint followed [place]responsiveness in a local park. It is assumed the pt is homeless. Prior to 1234 hrs, pt was talking and acting normal.  Unable to obtain any medical information from chart/patient.   Reviewed: Allergy & Precautions, NPO status , Patient's Chart, lab work & pertinent test results, Unable to perform ROS - Chart review onlyPreop documentation limited or incomplete due to emergent nature of procedure.  Airway Mallampati: II  TM Distance: >3 FB Neck ROM: Full    Dental  (+) Teeth Intact, Dental Advisory Given   Pulmonary    Pulmonary exam normal breath sounds clear to auscultation       Cardiovascular Normal cardiovascular exam Rhythm:Regular Rate:Normal     Neuro/Psych CVA    GI/Hepatic   Endo/Other    Renal/GU      Musculoskeletal   Abdominal   Peds  Hematology   Anesthesia Other Findings Day of surgery medications reviewed with the patient.  Reproductive/Obstetrics                             Anesthesia Physical Anesthesia Plan  ASA: IV and emergent  Anesthesia Plan: General   Post-op Pain Management:    Induction: Intravenous  PONV Risk Score and Plan: 2 and Treatment may vary due to age or medical condition  Airway Management Planned: Oral ETT  Additional Equipment: Arterial line  Intra-op Plan:   Post-operative Plan: Post-operative intubation/ventilation  Informed Consent:     Only emergency history available  Plan Discussed with: CRNA  Anesthesia Plan Comments: (CODE STROKE. Patient with unknown prior medical history.  Pre-op evaluation completed after induction of anesthesia due to emergent nature of procedure.)         Anesthesia Quick Evaluation

## 2020-12-17 NOTE — Sedation Documentation (Signed)
Patient transported to 4N17 with Dave-CRNA. Amy 4N-RN at the bedside. Groin site assessed. Soft to touch upon palpation, no drainage noted. Distal pulses intact via doppler.

## 2020-12-17 NOTE — Sedation Documentation (Signed)
Per Dr. Corliss Skains, no intervention to be performed at this time. Diagnostic angiogram completed.

## 2020-12-17 NOTE — Procedures (Signed)
Patient Name: [name]  MRN: [id number]  Epilepsy Attending: Charlsie Quest  Referring Physician/Provider: Jimmye Norman, NP Date: 12/17/2020 Duration: 24.25 mins  Patient history: unknown age male with ams, left gaze preference. EEG to evaluate for seizure.   Level of alertness: comatose  AEDs during EEG study: Propofol  Technical aspects: This EEG study was done with scalp electrodes positioned according to the 10-20 International system of electrode placement. Electrical activity was acquired at a sampling rate of 500Hz  and reviewed with a high frequency filter of 70Hz  and a low frequency filter of 1Hz . EEG data were recorded continuously and digitally stored.   Description: EEG showed continuous generalized 8-10Hz  alpha activity. EEG was not reactive to tactile stimulation. Hyperventilation and photic stimulation were not performed.     ABNORMALITY - Alpha coma  IMPRESSION: This study is suggestive of severe diffuse encephalopathy, nonspecific etiology but could be secondary to sedation, toxic-metabolic etiology, anoxic/hypoxic brain injury. No seizures or epileptiform discharges were seen throughout the recording.  Dr was notified  [name] 

## 2020-12-17 NOTE — Progress Notes (Addendum)
Tech called and spoke to RN about STAT EEG to see if pt was available per RN will call back as pt is possibly going to IR first. Tech will try back when schedule permits.

## 2020-12-17 NOTE — Anesthesia Procedure Notes (Signed)
Procedure Name: Intubation Date/Time: 12/17/2020 2:25 PM Performed by: Shireen Quan, CRNA Pre-anesthesia Checklist: Patient identified, Emergency Drugs available, Suction available and Patient being monitored Patient Re-evaluated:Patient Re-evaluated prior to induction Oxygen Delivery Method: Circle System Utilized Preoxygenation: Pre-oxygenation with 100% oxygen Induction Type: IV induction Ventilation: Mask ventilation without difficulty Laryngoscope Size: Glidescope and 4 Tube type: Oral Tube size: 8.0 mm Number of attempts: 1 Airway Equipment and Method: Rigid stylet and Video-laryngoscopy Placement Confirmation: ETT inserted through vocal cords under direct vision,  positive ETCO2 and breath sounds checked- equal and bilateral Secured at: 23 cm Tube secured with: Tape Dental Injury: Teeth and Oropharynx as per pre-operative assessment

## 2020-12-17 NOTE — Code Documentation (Signed)
Stroke Response Nurse Documentation Code Documentation  [facility] Aledo Doe is a 55 y.o. male arriving to [facility] ED via [facility] on 12/17/2020 with unknown past medical hx with potential diabetes and CHF per friend. Code stroke was activated by EMS. Patient from homeless shelter where he was LKW at 1230 and now complaining of unresponsive moving right side without left sided movement. Patient was with his friend complaining of chest pain. EMS Called and prior to arrival, pt went unresponsive with left gaze. EMS reported patient swatted with right but was not moving left.   Stroke team at the bedside on patient arrival. Labs drawn and patient cleared for CT by Dr. Rubin Payor. Patient to CT with team. NIHSS 35, see documentation for details and code stroke times. Patient with decreased LOC, disoriented, not following commands, left gaze preference , bilateral hemianopia, bilateral arm weakness, bilateral leg weakness, bilateral decreased sensation, Global aphasia  and dysarthria  on exam. The following imaging was completed: CT, CTA head and neck. Patient is not a candidate for tPA due to unknown etiology of symptoms and not clear stroke diagnosis per MD Pearlean Brownie. Care/Plan: Take patient to IR and complete diagnostic angiogram. Bedside handoff with IR RN Ronaldo Miyamoto.    Lucila Maine  Stroke Response RN

## 2020-12-17 NOTE — Anesthesia Procedure Notes (Signed)
Arterial Line Insertion Start/End1/17/2022 2:35 PM, 12/17/2020 2:40 PM Performed by: Shireen Quan, CRNA, CRNA  Patient location: OR. Preanesthetic checklist: patient identified, IV checked, site marked, risks and benefits discussed, surgical consent, monitors and equipment checked, pre-op evaluation, timeout performed and anesthesia consent Left, radial was placed Catheter size: 20 G Hand hygiene performed  and Seldinger technique used  Attempts: 2 Procedure performed without using ultrasound guided technique. Following insertion, dressing applied and Biopatch. Post procedure assessment: normal  Patient tolerated the procedure well with no immediate complications.

## 2020-12-17 NOTE — Procedures (Signed)
S/P 4 vessel cerebral arteriogram. RT CFA approach. Findings.  1. Severe 95% plus, prox  RT ICA stenosis. 2. Occluded mid to distal RT sup cerebellar artery. 3.. LT VA ends in Lt PICA. Patient left intubated to protect airway. 80F exoseal for hemostasis I the RT groin. Distal pulses all dopplerable S.Tayshaun Kroh MD

## 2020-12-17 NOTE — Consult Note (Signed)
NAMELedora [name], MRN:  [id number], DOB:  12/01/1873, LOS: 0 ADMISSION DATE:  12/17/2020, CONSULTATION DATE: 12/17/2020 REFERRING MD: Dr. Pearlean Brownie, CHIEF COMPLAINT: Unresponsiveness  Brief History:  Middle-aged male with unknown identity was brought in to the emergency department after he complained of chest pain and became unresponsive with left-sided weakness and left gaze deviation.  Underwent angiogram but no intervention, remained intubated  History of Present Illness:  Middle-aged African-American male with unknown identity and past medical history was [place] the emergency department [place]  Per bystander and patient complained of chest pain and all of a sudden he became unresponsive, in the ED was noted to have left sided flaccid paresis with forced left gaze deviation.  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  Past Medical History:  Unknown  Significant Hospital Events:  ETT 1/17  Consults:  Neurology PCCM  Procedures:  1/17 cerebral angiogram  Significant Diagnostic Tests:  1/17 CT head: Left cerebellar hypodensity concerning for acute/subacute infarct. No intracranial hemorrhage. Small right parietal scalp hematoma  1/17 CTA head and neck: Non opacification of the left vertebral artery with retrograde filling of the distal left V4 segment. Bilateral carotid bifurcation atheromatous disease with 50% right and 30% left carotid bulb narrowing. Mild left P2 segment narrowing.  Micro Data:  1/17 influenza/COVID PCR negative  Antimicrobials:  Cefazolin 1/17  Interim History / Subjective:    Objective   Blood  pressure (!) 191/140, pulse 90, temperature (!) 96.2 F (35.7 C), temperature source Rectal, resp. rate 12, height 6\' 1"  (1.854 m), weight 95.1 kg, SpO2 97 %.    Vent Mode: PRVC FiO2 (%):  [40 %-50 %] 40 % Set Rate:  [14 bmp-20 bmp] 20 bmp Vt Set:  [510 mL-630 mL] 510 mL PEEP:  [5 cmH20] 5 cmH20 Plateau Pressure:  [18 cmH20] 18 cmH20   Intake/Output Summary (Last 24 hours) at 12/17/2020 1653 Last data filed at 12/17/2020 1553 Gross per 24 hour  Intake 300 ml  Output --  Net 300 ml   Filed Weights   12/17/20 1300  Weight: 95.1 kg    Examination:   Physical exam: General: Acutely ill-appearing middle-aged African-American male, orally intubated HEENT: [place]/AT, eyes anicteric.  ETT and OGT in place Neuro: Eyes closed, not following commands.  Pupils pinpoint bilaterally, gaze is midline, withdrawing on left lower extremities, otherwise no movement in other extremities Chest: Coarse breath sounds, no wheezes or rhonchi Heart: Regular rate and rhythm, no murmurs or gallops Abdomen: Soft, nontender, nondistended, bowel sounds present Skin: No rash   Resolved Hospital Problem list     Assessment & Plan:  Acute/subacute left cerebellar infarct Questionable seizure activity Acute hypoxic respiratory failure Acute encephalopathy metabolic/toxic Probable chronic systolic congestive heart failure Acute hepatitis, likely due to low flow state Hypertension Hypothermia/hypoglycemia  Patient presented with flaccid left-sided weakness and left gaze deviation CT head confirmed acute/subacute left cerebellar stroke Patient underwent cerebral angiogram but no intervention was done due to no LVO Continue aspirin and statin MRI and MRA head and neck is pending Continue secondary stroke prophylaxis A1c, TSH, vitamin B12 and folate are pending Patient had left-sided jerking movements in the ED, EEG was  done which shows diffuse encephalopathy, no active seizures were noted on EEG Continue  lung protective mechanical ventilation Elevate head of the bed to prevent ventilator associated pneumonia Minimize sedation with RASS goal of -1/-2 Echocardiogram is pending Bedside ultrasonography is suggestive of severe systolic dysfunction Patient's LFTs are elevated likely due to low flow state from severe systolic dysfunction We will send acute hepatitis panel Allow permissive hypertension Patient was noted to be hypoglycemic and hypothermic upon presentation, will send cortisol and TSH He was given D50 x2 with improvement in his blood sugar Continue to monitor fingerstick with goal 140-180   Best practice (evaluated daily)  Diet: NPO Pain/Anxiety/Delirium protocol (if indicated): Propofol VAP protocol (if indicated): Ordered DVT prophylaxis: Subcu Lovenox GI prophylaxis: Protonix Glucose control: SSI Mobility: Bedrest Disposition: ICU  Goals of Care:   Code Status: Full code  Labs   CBC: Recent Labs  Lab 12/17/20 1310 12/17/20 1312 12/17/20 1624  WBC 9.8  --   --   NEUTROABS 4.9  --   --   HGB 15.0 16.3 13.6  HCT 45.4 48.0 40.0  MCV 98.9  --   --   PLT 396  --   --     Basic Metabolic Panel: Recent Labs  Lab 12/17/20 1310 12/17/20 1312 12/17/20 1624  NA 138 137 137  K 4.3 4.3 3.8  CL 102 105  --   CO2 22  --   --   GLUCOSE 108* 105*  --   BUN 17 21  --   CREATININE 1.32* 1.10  --   CALCIUM 8.6*  --   --    GFR: Estimated Creatinine Clearance: -5 mL/min (by C-G formula based on SCr of 1.1 mg/dL). Recent Labs  Lab 12/17/20 1310  WBC 9.8    Liver Function Tests: Recent Labs  Lab 12/17/20 1310  AST 282*  ALT 376*  ALKPHOS 165*  BILITOT 1.7*  PROT 6.9  ALBUMIN 2.8*   No results for input(s): LIPASE, AMYLASE in the last 168 hours. No results for input(s): AMMONIA in the last 168 hours.  ABG    Component Value Date/Time   PHART 7.367 12/17/2020 1624   PCO2ART 42.2 12/17/2020 1624   PO2ART 107 12/17/2020 1624   HCO3 24.6 12/17/2020  1624   TCO2 26 12/17/2020 1624   ACIDBASEDEF 1.0 12/17/2020 1624   O2SAT 98.0 12/17/2020 1624     Coagulation Profile: Recent Labs  Lab 12/17/20 1310  INR 1.5*    Cardiac Enzymes: No results for input(s): CKTOTAL, CKMB, CKMBINDEX, TROPONINI in the last 168 hours.  HbA1C: No results found for: HGBA1C  CBG: Recent Labs  Lab 12/17/20 1306 12/17/20 1316 12/17/20 1353  GLUCAP 54* 49* 266*    Review of Systems:   Unable to obtain as patient remained encephalopathic  Past Medical History:  He,  has no past medical history on file.   Surgical History:  Unable to obtain as patient remained encephalopathic  Social History:     Unable to obtain as patient remained encephalopathic Family History:  Unable to obtain as patient remained encephalopathic  Allergies Not on File   Home Medications  Prior to Admission medications   Not on File     Total critical care time: 55 minutes  Performed by: Cheri Fowler   Critical care time was exclusive of separately billable procedures and treating other patients.   Critical care was necessary to treat or prevent imminent or life-threatening deterioration.   Critical care was time spent  personally by me on the following activities: development of treatment plan with patient and/or surrogate as well as nursing, discussions with consultants, evaluation of patient's response to treatment, examination of patient, obtaining history from patient or surrogate, ordering and performing treatments and interventions, ordering and review of laboratory studies, ordering and review of radiographic studies, pulse oximetry and re-evaluation of patient's condition.   Cheri Fowler MD [facility] Pager: [phone] Mobile: [phone]

## 2020-12-17 NOTE — ED Notes (Signed)
Taken to [employer]

## 2020-12-17 NOTE — Procedures (Signed)
Echo attempted. EEG in progress. Will attempt again later. 

## 2020-12-17 NOTE — ED Provider Notes (Signed)
[facility] EMERGENCY DEPARTMENT Provider Note   CSN: [id number] Arrival date & time: 12/17/20  1303     History Chief Complaint  Patient presents with  . Code Stroke    [facility] Doe is a 55 y.o. male.  HPI Level 5 caveat due to unresponsiveness. Patient presented as a code stroke.  Reportedly last normal at 1230.  EMS been called to [facility] for chest pain.  Then had decreased mental status.  Per EMS mental status has waxed and waned a little bit.  Has minimally verbal for EMS was able to say his name.  Said his chest hurt.  However became more unresponsive.  Nonverbal upon arrival.  Eyes deviated to left consistently for EMS.  No seizure activity seen.  Patient was unknown to EMS and potentially was new to that area.  There is question of diabetes and hypertension as a history but unverified.  Initial CBG for EMS was in the 80s and initial blood pressure was 387 systolic.    No past medical history on file. Possible history of diabetes and hypertension. Patient Active Problem List   Diagnosis Date Noted  . Stroke ([place]) 12/17/2020         No family history on file.     Home Medications Prior to Admission medications   Not on File    Allergies    Patient has no allergy information on record.  Review of Systems   Review of Systems  Unable to perform ROS: Mental status change    Physical Exam Updated Vital Signs BP (!) 191/140   Pulse 91   Temp (!) 96.2 F (35.7 C) (Rectal)   Resp (!) 23   Wt 95.1 kg   SpO2 97%   Physical Exam Vitals and nursing note reviewed.  Constitutional:      Appearance: He is not diaphoretic.  HENT:     Head: Atraumatic.     Mouth/Throat:     Mouth: Mucous membranes are moist.  Eyes:     General: No scleral icterus.    Comments: Eyes deviated to left.  Pupils somewhat constricted.  Cardiovascular:     Rate and Rhythm: Regular rhythm.  Pulmonary:     Breath sounds: No wheezing or rhonchi.   Abdominal:     Tenderness: There is no abdominal tenderness.  Musculoskeletal:        General: No tenderness.     Cervical back: Neck supple.  Skin:    General: Skin is warm.     Capillary Refill: Capillary refill takes less than 2 seconds.  Neurological:     Comments: Decreased mental status.  Eyes deviated to left.  Initially flaccid diffusely.  Later would have some movement bilaterally but appears to have potentially more movement on the right than the left.  Gag reflex intact.  Initially would just turn head but later clamped down on tongue depressor.     ED Results / Procedures / Treatments   Labs (all labs ordered are listed, but only abnormal results are displayed) Labs Reviewed  PROTIME-INR - Abnormal; Notable for the following components:      Result Value   Prothrombin Time 17.5 (*)    INR 1.5 (*)    All other components within normal limits  CBC - Abnormal; Notable for the following components:   RDW 15.7 (*)    All other components within normal limits  COMPREHENSIVE METABOLIC PANEL - Abnormal; Notable for the following components:   Glucose,  Bld 108 (*)    Creatinine, Ser 1.32 (*)    Calcium 8.6 (*)    Albumin 2.8 (*)    AST 282 (*)    ALT 376 (*)    Alkaline Phosphatase 165 (*)    Total Bilirubin 1.7 (*)    GFR, Estimated 36 (*)    All other components within normal limits  CBG MONITORING, ED - Abnormal; Notable for the following components:   Glucose-Capillary 54 (*)    All other components within normal limits  I-STAT CHEM 8, ED - Abnormal; Notable for the following components:   Glucose, Bld 105 (*)    Calcium, Ion 0.97 (*)    All other components within normal limits  CBG MONITORING, ED - Abnormal; Notable for the following components:   Glucose-Capillary 49 (*)    All other components within normal limits  CBG MONITORING, ED - Abnormal; Notable for the following components:   Glucose-Capillary 266 (*)    All other components within normal limits   RESP PANEL BY RT-PCR (FLU A&B, COVID) ARPGX2  ETHANOL  APTT  DIFFERENTIAL  RAPID URINE DRUG SCREEN, HOSP PERFORMED  URINALYSIS, ROUTINE W REFLEX MICROSCOPIC    EKG None  Radiology CT Angio Head W or Wo Contrast  Result Date: 12/17/2020 CLINICAL DATA:  Neuro deficit, acute, stroke suspected EXAM: CT ANGIOGRAPHY HEAD AND NECK TECHNIQUE: Multidetector CT imaging of the head and neck was performed using the standard protocol during bolus administration of intravenous contrast. Multiplanar CT image reconstructions and MIPs were obtained to evaluate the vascular anatomy. Carotid stenosis measurements (when applicable) are obtained utilizing NASCET criteria, using the distal internal carotid diameter as the denominator. CONTRAST:  OMNIPAQUE IOHEXOL 350 MG/ML SOLN COMPARISON:  12/17/2020. FINDINGS: Please note evaluation is limited secondary to suboptimal contrast bolus timing. CTA NECK FINDINGS Aortic arch: Standard branching. Aortic arch atheromatous disease. Imaged portion shows no evidence of aneurysm or dissection. No significant stenosis of the major arch vessel origins. Right carotid system: Patent. Carotid bifurcation atheromatous disease with 50% narrowing of the carotid bulb. Left carotid system: Patent. Carotid bifurcation atheromatous disease with 30% narrowing of the carotid bulb. Vertebral arteries: Dominant right vertebral artery. The right vertebral artery is patent. Likely retrograde filling of the distal left V4 segment. There is non opacification of the left vertebral artery proximally. Skeleton: No acute finding.  Multilevel spondylosis. Other neck: No adenopathy.  No soft tissue mass. Upper chest: Please see concurrent CTA chest for findings below the thoracic inlet. Review of the MIP images confirms the above findings CTA HEAD FINDINGS Anterior circulation: Patent ICAs. Bilateral carotid siphon atherosclerotic calcifications without significant narrowing. Patent MCAs. Right A1  segment hypoplasia. Patent ACAs. Patent basilar and proximal superior cerebellar arteries. Patent posterior cerebral arteries. Mild narrowing of the left P2 segment. Posterior circulation: Patent right vertebral artery. Retrograde filling of the distal left V4 segment. Venous sinuses: As permitted by contrast timing, patent. Anatomic variants: Please see above. Review of the MIP images confirms the above findings IMPRESSION: Non opacification of the left vertebral artery with retrograde filling of the distal left V4 segment. Bilateral carotid bifurcation atheromatous disease with 50% right and 30% left carotid bulb narrowing. Mild left P2 segment narrowing. Sub optimal contrast bolus timing limits evaluation. Code stroke imaging results were communicated on 12/17/2020 at 2:17 pm to provider Dr. Pearlean Brownie via secure text paging. Electronically Signed   By: Stana Bunting M.D.   On: 12/17/2020 14:18   CT Angio Neck W and/or Wo  Contrast  Result Date: 12/17/2020 CLINICAL DATA:  Neuro deficit, acute, stroke suspected EXAM: CT ANGIOGRAPHY HEAD AND NECK TECHNIQUE: Multidetector CT imaging of the head and neck was performed using the standard protocol during bolus administration of intravenous contrast. Multiplanar CT image reconstructions and MIPs were obtained to evaluate the vascular anatomy. Carotid stenosis measurements (when applicable) are obtained utilizing NASCET criteria, using the distal internal carotid diameter as the denominator. CONTRAST:  126mL OMNIPAQUE IOHEXOL 350 MG/ML SOLN COMPARISON:  12/17/2020. FINDINGS: Please note evaluation is limited secondary to suboptimal contrast bolus timing. CTA NECK FINDINGS Aortic arch: Standard branching. Aortic arch atheromatous disease. Imaged portion shows no evidence of aneurysm or dissection. No significant stenosis of the major arch vessel origins. Right carotid system: Patent. Carotid bifurcation atheromatous disease with 50% narrowing of the carotid bulb. Left  carotid system: Patent. Carotid bifurcation atheromatous disease with 30% narrowing of the carotid bulb. Vertebral arteries: Dominant right vertebral artery. The right vertebral artery is patent. Likely retrograde filling of the distal left V4 segment. There is non opacification of the left vertebral artery proximally. Skeleton: No acute finding.  Multilevel spondylosis. Other neck: No adenopathy.  No soft tissue mass. Upper chest: Please see concurrent CTA chest for findings below the thoracic inlet. Review of the MIP images confirms the above findings CTA HEAD FINDINGS Anterior circulation: Patent ICAs. Bilateral carotid siphon atherosclerotic calcifications without significant narrowing. Patent MCAs. Right A1 segment hypoplasia. Patent ACAs. Patent basilar and proximal superior cerebellar arteries. Patent posterior cerebral arteries. Mild narrowing of the left P2 segment. Posterior circulation: Patent right vertebral artery. Retrograde filling of the distal left V4 segment. Venous sinuses: As permitted by contrast timing, patent. Anatomic variants: Please see above. Review of the MIP images confirms the above findings IMPRESSION: Non opacification of the left vertebral artery with retrograde filling of the distal left V4 segment. Bilateral carotid bifurcation atheromatous disease with 50% right and 30% left carotid bulb narrowing. Mild left P2 segment narrowing. Sub optimal contrast bolus timing limits evaluation. Code stroke imaging results were communicated on 12/17/2020 at 2:17 pm to provider Dr. Leonie Man via secure text paging. Electronically Signed   By: Primitivo Gauze M.D.   On: 12/17/2020 14:18   CT HEAD CODE STROKE WO CONTRAST  Result Date: 12/17/2020 CLINICAL DATA:  Code stroke.  Neuro deficit, acute, stroke suspected EXAM: CT HEAD WITHOUT CONTRAST TECHNIQUE: Contiguous axial images were obtained from the base of the skull through the vertex without intravenous contrast. COMPARISON:  None. FINDINGS:  Brain: No intracranial hemorrhage. Focal left cerebellar hypodensity. No mass lesion. No midline shift, ventriculomegaly or extra-axial fluid collection. Vascular: No hyperdense vessel or unexpected calcification. Skull: Negative for fracture or focal lesion. Sinuses/Orbits: Normal orbits. Chronic defect of the medial left orbital wall. Clear paranasal sinuses. No mastoid effusion. Other: Small right parietal scalp hematoma. ASPECTS [facility] Stroke Program Early CT Score) - Ganglionic level infarction (caudate, lentiform nuclei, internal capsule, insula, M1-M3 cortex): 7 - Supraganglionic infarction (M4-M6 cortex): 3 Total score (0-10 with 10 being normal): 10 IMPRESSION: 1. Left cerebellar hypodensity concerning for acute/subacute infarct. 2. No intracranial hemorrhage. 3. ASPECTS is 10 4. Small right parietal scalp hematoma. 5. Code stroke imaging results were communicated on 12/17/2020 at 1:32 pm to provider Dr. Leonie Man via secure text paging. Electronically Signed   By: Primitivo Gauze M.D.   On: 12/17/2020 13:35    Procedures Procedures (including critical care time)  Medications Ordered in ED Medications  LORazepam (ATIVAN) injection 1 mg (has no administration in  time range)  iohexol (OMNIPAQUE) 240 MG/ML injection (has no administration in time range)  aspirin 81 MG chewable tablet (has no administration in time range)  verapamil (ISOPTIN) 2.5 MG/ML injection (has no administration in time range)  ticagrelor (BRILINTA) 90 MG tablet (has no administration in time range)  clopidogrel (PLAVIX) 300 MG tablet (has no administration in time range)  cangrelor (KENGREAL) 50 MG SOLR (has no administration in time range)  tirofiban (AGGRASTAT) 5-0.9 MG/100ML-% injection (has no administration in time range)  eptifibatide (INTEGRILIN) 20 MG/10ML injection (has no administration in time range)  nitroGLYCERIN 100 mcg/mL intra-arterial injection (has no administration in time range)  fentaNYL  (SUBLIMAZE) 100 MCG/2ML injection (has no administration in time range)  ceFAZolin (ANCEF) 2-4 GM/100ML-% IVPB (has no administration in time range)  pantoprazole (PROTONIX) injection 40 mg (has no administration in time range)  0.9 %  sodium chloride infusion (has no administration in time range)  acetaminophen (TYLENOL) tablet 650 mg (has no administration in time range)  ondansetron (ZOFRAN) injection 4 mg (has no administration in time range)  enoxaparin (LOVENOX) injection 30 mg (has no administration in time range)  iohexol (OMNIPAQUE) 350 MG/ML injection 100 mL (100 mLs Intravenous Contrast Given 12/17/20 1354)    ED Course  I have reviewed the triage vital signs and the nursing notes.  Pertinent labs & imaging results that were available during my care of the patient were reviewed by me and considered in my medical decision making (see chart for details).    MDM Rules/Calculators/A&P                          Patient brought in as a code stroke.  Decreased mental status flaccid diffusely.  Potentially has more movement on right compared to left when he got some more activity back.  Eyes continuously deviated to the left however.  Met by Dr. Leonie Man and myself at the bridge upon [place].  EMS CBG was 80 however first 1 here was 50 and then repeated after D50 that showed down to 45.  Second amp given.  However i-STAT showed CBG of 100 drawn at the same time.  Later recheck a CBG showed over 200.  May have had slight increase in mental status but not enough to be diagnostic.  Also given Narcan for constricted pupils.  No real effect to the seizure. Head CT done and CTA done but somewhat difficult with bolus timing.  Patient had a mildly tenuous airway.  Decision made in conjunction with neurology and was taken to interventional radiology for better angiogram as this could be a debilitating lesion.  Also likely will get EEG done.  Admit to neurology.  CRITICAL CARE Performed by: Davonna Belling Total critical care time: 30 minutes Critical care time was exclusive of separately billable procedures and treating other patients. Critical care was necessary to treat or prevent imminent or life-threatening deterioration. Critical care was time spent personally by me on the following activities: development of treatment plan with patient and/or surrogate as well as nursing, discussions with consultants, evaluation of patient's response to treatment, examination of patient, obtaining history from patient or surrogate, ordering and performing treatments and interventions, ordering and review of laboratory studies, ordering and review of radiographic studies, pulse oximetry and re-evaluation of patient's condition.  Final Clinical Impression(s) / ED Diagnoses Final diagnoses:  Stroke [facility])    Rx / DC Orders ED Discharge Orders    None  Davonna Belling, MD 12/17/20 1520

## 2020-12-17 NOTE — Progress Notes (Signed)
EEG complete - results pending 

## 2020-12-17 NOTE — Consult Note (Addendum)
Neurology Code Stroke Admission H&P  CC: CODE STROKE  History is obtained from: EMS  HPI: [facility] [name] is a 55 y.o. male with an unknown name and DOB with a questionable PMHx of CHF and DM II per bystanders who presents to [facility] ED via EMS after chest pain complaint followed by unresponsiveness at a local park. It is assumed the pt is homeless. Prior to 1234 hrs, pt was talking and acting normal. Bystanders stated he c/o chest pain and then became unresponsive. 911 was called at 1234 hrs and EMS arrived. Pt vomited. Upon EMS arrival, pt was essentially unresponsive with left sided gaze. He did swat away the airway with the right arm that EMS attempted. No other movement of extremities was noted. Pt was non verbal and not following commands. CBG in field was 127, but had fallen to 84 and later 55.   Upon arrival to ED, patient was essentially unresponsive. + gag reflex. No noted movement of extremities upon arrival. His UEs were flaccid and LEs hypotonic.  + left gaze preference that could not be overcome. Pinpoint pupils. Did not blink to threat. ? urinary incontinence vs vomitus on pants. Narcan was given per ED MD without change in LOC. Pt received I amp of D50 prior to moving to CT due to hypoglycemia.   Pt was then scanned. 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 Pt was still hypoglycemic after 1 amp D50, so this was repeated and sugar rose to 176. T 96.2 rectally. Prior to IR, pt started flailing and with movement of all extremities but still did not follow commands or have verbal response.  Due to concern of thrombus, Dr. Pearlean Brownie discussed scans with Dr. Bedelia Person. It was decided to return the pt to ED and get him more stablized and if no improvement in sx within 10 mins, code IR would be activated. Code IR was activated after the 10 mins. Pt was intubated in IR suite by anesthesia. MR/MRA brain and  EEG to be obtained after angiogram.   LKW: Minutes prior to 1234 hrs 911 call.  tpa given?: No,, CTA head and neck could not be completed due to contrast not circulating.  Code IR was called. Angio pending.   MRS is unknown.   NIHSS:  1a Level of Conscious:3 1b LOC Questions: 3 1c LOC Commands: 3 2 Best Gaze: 3 3 Visual: 0 4 Facial Palsy: 0 5a Motor Arm - left: 3 5b Motor Arm - Right:3  6a Motor Leg - Left: 3 6b Motor Leg - Right: 3 7 Limb Ataxia: 0 8 Sensory: 2 9 Best Language: 3 10 Dysarthria: 2 11 Extinct. and Inattention: 2  TOTAL: 30  ROS: Vomited in the field, otherwise, unable to assess due to altered mental status.   No past medical history on file.  No family history on file.  Social History:  has no history on file for tobacco use, alcohol use, and drug use.   Prior to Admission medications   Not on File    Exam: Current vital signs: Wt 95.1 kg   Physical Exam  Constitutional: Appears well-developed and well-nourished.  Psych: Unable to assess. Unresponsive, later agitated.  Eyes: No scleral injection HENT: No OP obstrucion Head: Normocephalic.  Cardiovascular: RRR on tele Respiratory: Effort normal.  GI: Soft.  Skin: WDI  Neuro: GCS:  Best verbal: 1     Best motor:  4     Best eye: 3       Total: 8 Mental Status: Patient is unresponsive to voice and tactile stimulation. Non verbal and follows no commands.  Patient is unable to give a clear and coherent history. Left gaze preference.  Speech/Language: non verbal.   Cranial Nerves: II: Pupils are 74mm equal, round, and sluggish.  III,IV, VI: eyes are dysconjugate with forced left gaze deviation which cannot be overcome even with doll's eye movements..  V, VII: furrows with brow pressure.  VIII: hearing is intact to loud voice at r/p exam.  IX, X: + cough XI: head midline XII: pt will not protrude tongue Motor: Tone is flaccid BUE/BLEs. Bulk is normal. Opens right eye to noxious stimuli.   Sensory: No withdrawal to pain initially.   Plantars: Toes are downgoing bilaterally. Cerebellar: No tremors.  Gait: deferred.   I have reviewed labs in epic and the pertinent results are: INR  1.5     Dr. Pearlean Brownie reviewed films independently and with Dr. Bedelia Person.   NCT head showed  1. Left cerebellar hypodensity concerning for acute/subacute infarct. 2. No intracranial hemorrhage. 3. ASPECTS is 10 4. Small right parietal scalp hematoma.  CTA head and neck:  Non opacification of the left vertebral artery with retrograde filling of the distal left V4 segment. Bilateral carotid bifurcation atheromatous disease with 50% right and 30% left carotid bulb narrowing. Mild left P2 segment narrowing. Sub optimal contrast bolus timing limits evaluation.  Assessment: [name] is a 55 y.o. male with unknown name and DOB with a possible PMHx of CHF, DM II. Pt is assumed homeless and was in a city park earlier today. He c/o chest pain to bystanders and then went unresponsive. 911 was called at 1234 hrs and EMS arrived. Upon their arrival, pt was unresponsive and vomited and had left gaze preference. No seizure like activity was noted. Pt was brought to ED as a CODE STROKE. After correction of sugar, pt was taken to CT scanner with above results. Concern was for posterior circulation thrombus and pt was taken to IR. Angiogram is pending at time of this note. Pt was intubated in IR suite. Due to his gaze not matching his weakness, thought was given to seizure.   Impression:  1. AMS. No bleed or acute infarct on CTH. CTA head and neck lacked perfusion of contrast. Some stenosis found on angiogram but not warranted of treatment.  2. ? seizure 3. Sub acute infarct left cerebellum.   Plan: - Admit to ICU to Dr. Pearlean Brownie.  - MRI brain without contrast. - frequent neuro checks and NIHSS per protocol.  - OG for now.  - NPO until awake and passes swallow study.  - permissive HTN first 24-48  hrs, then normalize.  - Recommend TTE. - Recommend labs: HbA1c, lipid panel, TSH. - Recommend Statin if LDL > 70 - Telemetry monitoring for arrhythmia. - Recommend bedside Swallow screen. - Recommend Stroke education. - Recommend PT/OT/SLP consult. - stat rEEG to eval for any epileptogenic discharges-ordered. - Admit to stroke team.  - PCCM consult to manage vent if still intubated post procedures.  - Recommend metabolic/infectious workup with CBC, CMP, UA with UCx, CXR, CK, serum lactate.  Pt seen by Jimmye Norman, NP and MD. Note/plan to be edited by MD as necessary.  Pager: [phone]  Stroke Attending Note :  I have personally obtained history,examined this patient, reviewed notes, independently viewed imaging studies, participated in medical decision making and plan  of care.ROS completed by me personally and pertinent positives fully documented  I have made any additions or clarifications directly to the above note. Agree with note above.  Patient presented with    chest pain clutching his chest and followed by unresponsiveness and vomiting and EMS noticed right-sided movements but left gaze deviation and left hemiparesis.  Patient remained unresponsive despite being given IV glucose x2.  His pulse and blood pressure have been fairly stable.  His neurological exam shows still fixed left gaze deviation with poor responsiveness and intermittently moving the right-sided extremities greater than left side but not to command.  CT scans showed subacute left cerebellar infarct with possible dense basilar artery sign but CT angiogram was of poor quality due to timing of dye injection hence patient was taken for emergent cerebral catheter angiogram and was intubated for airway protection.  Two-physician emergency consent obtained by Dr. Pearlean Brownie and Dr Corliss Skains  IV tPA considered but not given because diagnosis of stroke was not certain given lack of significant focal findings except gaze deviation  and CT scan showing a subacute cerebellar infarct patient unable to give clear history. Plan admit to neurological intensive care unit and keep intubated after diagnostic angiogram if LVO is found patient will get mechanical thrombectomy with two-physician consent.  If no LVO found we will check EEG for seizures and MRI to look for stroke.  Critical care team to help manage airway and ventilation.  Long discussion with ER physician and critical care team. This patient is critically ill and at significant risk of neurological worsening, death and care requires constant monitoring of vital signs, hemodynamics,respiratory and cardiac monitoring, neurological assessment, discussion with family, other specialists and medical decision making of high complexity. I spent 65 minutes of neurocritical care time  in the care of  this patient. This was time spent independent of any time provided by nurse practitioner or PA.   Delia Heady, MD Medical Director [facility] Pager: [phone] 12/17/2020 3:58 PM

## 2020-12-17 NOTE — Progress Notes (Signed)
Pt transported to and from CT without event.

## 2020-12-17 NOTE — Anesthesia Postprocedure Evaluation (Signed)
Anesthesia Post Note  Patient: [name]  Procedure(s) Performed: IR WITH ANESTHESIA (N/A )     Patient location during evaluation: SICU Anesthesia Type: General Level of consciousness: sedated Pain management: pain level controlled Vital Signs Assessment: post-procedure vital signs reviewed and stable Respiratory status: patient remains intubated per anesthesia plan Cardiovascular status: stable Postop Assessment: no apparent nausea or vomiting Anesthetic complications: no   No complications documented.  Last Vitals:  Vitals:   12/17/20 1600 12/17/20 1650  BP:    Pulse:  90  Resp: 12   Temp:    SpO2:  97%    Last Pain:  Vitals:   12/17/20 1409  TempSrc:   PainSc: 0-No pain                 Cecile Hearing

## 2020-12-17 NOTE — Consult Note (Incomplete)
NAMELedora Bottcher, MRN:  [id number], DOB:  12/01/1873, LOS: 0 ADMISSION DATE:  12/17/2020, CONSULTATION DATE:  12/17/20 REFERRING MD:  Pearlean Brownie  CHIEF COMPLAINT:  Vent Management   Brief History   [facility] Doe is a 55 y.o. male who was admitted 1/17 with AMS of unclear etiology (? Stroke vs seizures) and chest pain.  History of present illness   Pt is encephelopathic; therefore, this HPI is obtained from chart review. [facility] Doe is a 55 y.o. male who has an unknown PMH and is presumed to be homeless.  He presented to [facility] ED 1/17 via EMS with chest pain followed by unresponsiveness while he was at a local park.  He was apparently seen normal then suddenly complained of chest pain before going unresponsive.  911 was called at 1234 and upon EMS arrival, pt vomited and remained unresponsive with left sided gaze.  EMS attempted to place an airway which pt apparently swatted away with right arm.  En route he was hypoglycemic to 55.  He received 1 amp D50 and later an additional amp in ED.  He had CT head that showed old left cerebellar infarct without hemorrhage.  CTA was not able to be completed due to non perfusion of contrast.   Due to concerns for thrombus, he was intubated and was taken to IR.  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  No interventions were performed.  Pt was left on the ventilator and was transferred to neuro ICU.   Past Medical History  Unknown. does not have a problem list on file.  Significant Hospital Events   1/17 > admit.  Consults:  Neuro, PCCM.  Procedures:  ETT 1/17 >   Significant Diagnostic Tests:  CT head 1/17 > left cerebellar hypodensity concerning for acute / subacute infarct.  No hemorrhage. CTA head / neck 1/17 > non opacification of left verterbal artery with retrograde filling of the distal left V4  segment. 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  No interventions were performed.  Pt was left on the ventilator and was transferred to neuro ICU. Echo 1/17 >  MRI brain 1/17 >   Micro Data:  Flu 1/17 > neg. COVID 1/17 > neg.  Antimicrobials:  None.   Interim history/subjective:  ***  Objective:  Blood pressure (!) 191/140, pulse 91, temperature (!) 96.2 F (35.7 C), temperature source Rectal, resp. rate (!) 23, weight 95.1 kg, SpO2 97 %.       No intake or output data in the 24 hours ending 12/17/20 1514 Filed Weights   12/17/20 1300  Weight: 95.1 kg    Examination: General: *** Neuro: *** HEENT: *** Cardiovascular: *** Lungs: *** Abdomen: *** Musculoskeletal: *** Skin: ***  Assessment & Plan:   AMS - unclear etiology at this point but concern for seizures given negative imaging thus far. - Per neuro. - F/u on brain MRI, EEG. - Assess TSH, folate, B12, HIV.  Respiratory insufficiency - s/p intubation for above. - SBT as mental status allows. - Bronchial hygiene. - Follow CXR.  Hypocalcemia. - 1g Ca gluconate.  Transaminitis - unclear etiology at this point.  Ethanol negative. - Assess APAP. - Trend LFT's.  Chest pain - unclear etiology. - Trend troponins. - F/u  on UDS.   Best Practice (evaluated daily):  Diet: NPO. Pain/Anxiety/Delirium protocol (if indicated): Propofol gtt / Fentanyl PRN.  RASS goal -1. VAP protocol (if indicated): In place. DVT prophylaxis: SCD's / Heparin. GI prophylaxis: PPI. Glucose control: SSI if glucose consistently > 180. Mobility: Bedrest. Disposition: ICU.  Goals of Care:  Last date of multidisciplinary goals of care discussion: None.  Family and staff present: None. Summary of discussion: None. Follow up goals of care discussion due: 1/23. Code Status:  Full.  Labs   CBC: Recent Labs  Lab 12/17/20 1310  12/17/20 1312  WBC 9.8  --   NEUTROABS 4.9  --   HGB 15.0 16.3  HCT 45.4 48.0  MCV 98.9  --   PLT 396  --    Basic Metabolic Panel: Recent Labs  Lab 12/17/20 1310 12/17/20 1312  NA 138 137  K 4.3 4.3  CL 102 105  CO2 22  --   GLUCOSE 108* 105*  BUN 17 21  CREATININE 1.32* 1.10  CALCIUM 8.6*  --    GFR: CrCl cannot be calculated (Unknown ideal weight.). Recent Labs  Lab 12/17/20 1310  WBC 9.8   Liver Function Tests: Recent Labs  Lab 12/17/20 1310  AST 282*  ALT 376*  ALKPHOS 165*  BILITOT 1.7*  PROT 6.9  ALBUMIN 2.8*   No results for input(s): LIPASE, AMYLASE in the last 168 hours. No results for input(s): AMMONIA in the last 168 hours. ABG    Component Value Date/Time   TCO2 22 12/17/2020 1312    Coagulation Profile: Recent Labs  Lab 12/17/20 1310  INR 1.5*   Cardiac Enzymes: No results for input(s): CKTOTAL, CKMB, CKMBINDEX, TROPONINI in the last 168 hours. HbA1C: No results found for: HGBA1C CBG: Recent Labs  Lab 12/17/20 1306 12/17/20 1316 12/17/20 1353  GLUCAP 54* 49* 266*    Review of Systems:   Unable to obtain as pt is encephalopathic.  Past medical history  He,  has no past medical history on file.   Surgical History   Not available.  Social History      Family history   His family history is not on file.   Allergies Not on File   Home meds  Prior to Admission medications   Not on File    Critical care time: ***   ***

## 2020-12-18 ENCOUNTER — Encounter (HOSPITAL_COMMUNITY): Payer: Self-pay | Admitting: Radiology

## 2020-12-18 ENCOUNTER — Inpatient Hospital Stay (HOSPITAL_COMMUNITY): Payer: Medicaid Other

## 2020-12-18 DIAGNOSIS — I651 Occlusion and stenosis of basilar artery: Secondary | ICD-10-CM

## 2020-12-18 DIAGNOSIS — I428 Other cardiomyopathies: Secondary | ICD-10-CM | POA: Diagnosis not present

## 2020-12-18 DIAGNOSIS — I6389 Other cerebral infarction: Secondary | ICD-10-CM

## 2020-12-18 DIAGNOSIS — F1410 Cocaine abuse, uncomplicated: Secondary | ICD-10-CM | POA: Diagnosis not present

## 2020-12-18 DIAGNOSIS — I6300 Cerebral infarction due to thrombosis of unspecified precerebral artery: Secondary | ICD-10-CM | POA: Diagnosis not present

## 2020-12-18 LAB — CBC WITH DIFFERENTIAL/PLATELET
Abs Immature Granulocytes: 0.06 10*3/uL (ref 0.00–0.07)
Basophils Absolute: 0 10*3/uL (ref 0.0–0.1)
Basophils Relative: 0 %
Eosinophils Absolute: 0 10*3/uL (ref 0.0–0.5)
Eosinophils Relative: 0 %
HCT: 42 % (ref 39.0–52.0)
Hemoglobin: 13.6 g/dL (ref 13.0–17.0)
Immature Granulocytes: 1 %
Lymphocytes Relative: 13 %
Lymphs Abs: 1.5 10*3/uL (ref 0.7–4.0)
MCH: 32.4 pg (ref 26.0–34.0)
MCHC: 32.4 g/dL (ref 30.0–36.0)
MCV: 100 fL (ref 80.0–100.0)
Monocytes Absolute: 1.3 10*3/uL — ABNORMAL HIGH (ref 0.1–1.0)
Monocytes Relative: 11 %
Neutro Abs: 8.9 10*3/uL — ABNORMAL HIGH (ref 1.7–7.7)
Neutrophils Relative %: 75 %
Platelets: 317 10*3/uL (ref 150–400)
RBC: 4.2 MIL/uL — ABNORMAL LOW (ref 4.22–5.81)
RDW: 15.6 % — ABNORMAL HIGH (ref 11.5–15.5)
WBC: 11.8 10*3/uL — ABNORMAL HIGH (ref 4.0–10.5)
nRBC: 0 % (ref 0.0–0.2)

## 2020-12-18 LAB — COMPREHENSIVE METABOLIC PANEL
ALT: 376 U/L — ABNORMAL HIGH (ref 0–44)
ALT: 284 U/L — ABNORMAL HIGH (ref 0–44)
AST: 282 U/L — ABNORMAL HIGH (ref 15–41)
AST: 174 U/L — ABNORMAL HIGH (ref 15–41)
Albumin: 2.8 g/dL — ABNORMAL LOW (ref 3.5–5.0)
Albumin: 2.3 g/dL — ABNORMAL LOW (ref 3.5–5.0)
Alkaline Phosphatase: 165 U/L — ABNORMAL HIGH (ref 38–126)
Alkaline Phosphatase: 129 U/L — ABNORMAL HIGH (ref 38–126)
Anion gap: 14 (ref 5–15)
Anion gap: 11 (ref 5–15)
BUN: 17 mg/dL (ref 6–20)
BUN: 16 mg/dL (ref 6–20)
CO2: 22 mmol/L (ref 22–32)
CO2: 22 mmol/L (ref 22–32)
Calcium: 8.6 mg/dL — ABNORMAL LOW (ref 8.9–10.3)
Calcium: 8 mg/dL — ABNORMAL LOW (ref 8.9–10.3)
Chloride: 102 mmol/L (ref 98–111)
Chloride: 104 mmol/L (ref 98–111)
Creatinine, Ser: 1.32 mg/dL — ABNORMAL HIGH (ref 0.61–1.24)
Creatinine, Ser: 1.31 mg/dL — ABNORMAL HIGH (ref 0.61–1.24)
GFR, Estimated: 36 mL/min — ABNORMAL LOW (ref >60)
GFR, Estimated: 37 mL/min — ABNORMAL LOW (ref >60)
Glucose, Bld: 108 mg/dL — ABNORMAL HIGH (ref 70–99)
Glucose, Bld: 83 mg/dL (ref 70–99)
Potassium: 4.3 mmol/L (ref 3.5–5.1)
Potassium: 4.6 mmol/L (ref 3.5–5.1)
Sodium: 138 mmol/L (ref 135–145)
Sodium: 137 mmol/L (ref 135–145)
Total Bilirubin: 1.7 mg/dL — ABNORMAL HIGH (ref 0.3–1.2)
Total Bilirubin: 1.1 mg/dL (ref 0.3–1.2)
Total Protein: 6.9 g/dL (ref 6.5–8.1)
Total Protein: 5.6 g/dL — ABNORMAL LOW (ref 6.5–8.1)

## 2020-12-18 LAB — POCT I-STAT, CHEM 8
BUN: 21 mg/dL — ABNORMAL HIGH (ref 6–20)
Calcium, Ion: 0.97 mmol/L — ABNORMAL LOW (ref 1.15–1.40)
Chloride: 105 mmol/L (ref 98–111)
Creatinine, Ser: 1.1 mg/dL (ref 0.61–1.24)
Glucose, Bld: 105 mg/dL — ABNORMAL HIGH (ref 70–99)
HCT: 48 % (ref 39.0–52.0)
Hemoglobin: 16.3 g/dL (ref 13.0–17.0)
Potassium: 4.3 mmol/L (ref 3.5–5.1)
Sodium: 137 mmol/L (ref 135–145)
TCO2: 22 mmol/L (ref 22–32)

## 2020-12-18 LAB — ECHOCARDIOGRAM COMPLETE
AR max vel: 1.77 cm2
AV Area VTI: 1.78 cm2
AV Area mean vel: 1.55 cm2
AV Mean grad: 2 mmHg
AV Peak grad: 3.9 mmHg
Ao pk vel: 0.99 m/s
Area-P 1/2: 4.06 cm2
Height: 73 in
S' Lateral: 5.6 cm
Weight: 3354.52 oz

## 2020-12-18 LAB — HEMOGLOBIN A1C
Hgb A1c MFr Bld: 6.4 % — ABNORMAL HIGH (ref 4.8–5.6)
Mean Plasma Glucose: 136.98 mg/dL

## 2020-12-18 LAB — LIPID PANEL
Cholesterol: 116 mg/dL (ref 0–200)
HDL: 26 mg/dL — ABNORMAL LOW (ref >40)
LDL Cholesterol: 72 mg/dL (ref 0–99)
Total CHOL/HDL Ratio: 4.5 RATIO
Triglycerides: 88 mg/dL (ref <150)
VLDL: 18 mg/dL (ref 0–40)

## 2020-12-18 LAB — MAGNESIUM
Magnesium: 1.8 mg/dL (ref 1.7–2.4)
Magnesium: 1.8 mg/dL (ref 1.7–2.4)

## 2020-12-18 LAB — HEPATITIS PANEL, ACUTE
HCV Ab: NONREACTIVE
Hep A IgM: NONREACTIVE
Hep B C IgM: NONREACTIVE
Hepatitis B Surface Ag: NONREACTIVE

## 2020-12-18 LAB — GLUCOSE, CAPILLARY
Glucose-Capillary: 73 mg/dL (ref 70–99)
Glucose-Capillary: 30 mg/dL — CL (ref 70–99)
Glucose-Capillary: 80 mg/dL (ref 70–99)
Glucose-Capillary: 99 mg/dL (ref 70–99)

## 2020-12-18 LAB — PHOSPHORUS
Phosphorus: 3 mg/dL (ref 2.5–4.6)
Phosphorus: 3.2 mg/dL (ref 2.5–4.6)

## 2020-12-18 LAB — AMMONIA: Ammonia: 24 umol/L (ref 9–35)

## 2020-12-18 LAB — HEPARIN LEVEL (UNFRACTIONATED): Heparin Unfractionated: 0.2 IU/mL — ABNORMAL LOW (ref 0.30–0.70)

## 2020-12-18 LAB — TRIGLYCERIDES: Triglycerides: 111 mg/dL (ref <150)

## 2020-12-18 LAB — TROPONIN I (HIGH SENSITIVITY): Troponin I (High Sensitivity): 42 ng/L — ABNORMAL HIGH (ref <18)

## 2020-12-18 MED ORDER — HYDRALAZINE HCL 25 MG PO TABS
25.0000 mg | ORAL_TABLET | Freq: Three times a day (TID) | ORAL | Status: DC
  Administered 2020-12-18 – 2020-12-24 (×19): 25 mg
  Filled 2020-12-18 (×19): qty 1

## 2020-12-18 MED ORDER — PROSOURCE TF PO LIQD
45.0000 mL | Freq: Three times a day (TID) | ORAL | Status: DC
Start: 2020-12-18 — End: 2020-12-20
  Administered 2020-12-18 – 2020-12-20 (×5): 45 mL
  Filled 2020-12-18 (×5): qty 45

## 2020-12-18 MED ORDER — VITAL HIGH PROTEIN PO LIQD
1000.0000 mL | ORAL | Status: DC
  Filled 2020-12-18: qty 1000

## 2020-12-18 MED ORDER — VITAL AF 1.2 CAL PO LIQD
1000.0000 mL | ORAL | Status: DC
  Administered 2020-12-18 – 2020-12-20 (×3): 1000 mL
  Filled 2020-12-18: qty 1000

## 2020-12-18 MED ORDER — SILVER SULFADIAZINE 1 % EX CREA
TOPICAL_CREAM | Freq: Two times a day (BID) | CUTANEOUS | Status: DC
  Administered 2020-12-18 – 2020-12-20 (×3): 1 via TOPICAL
  Filled 2020-12-18 (×3): qty 85

## 2020-12-18 MED ORDER — PERFLUTREN LIPID MICROSPHERE
1.0000 mL | INTRAVENOUS | Status: AC | PRN
  Administered 2020-12-18: 4 mL via INTRAVENOUS
  Filled 2020-12-18: qty 10

## 2020-12-18 MED ORDER — HEPARIN (PORCINE) 25000 UT/250ML-% IV SOLN
1700.0000 [IU]/h | INTRAVENOUS | Status: DC
  Administered 2020-12-18: 1150 [IU]/h via INTRAVENOUS
  Administered 2020-12-19: 1450 [IU]/h via INTRAVENOUS
  Administered 2020-12-20: 1700 [IU]/h via INTRAVENOUS
  Administered 2020-12-20: 1650 [IU]/h via INTRAVENOUS
  Administered 2020-12-21: 1700 [IU]/h via INTRAVENOUS
  Filled 2020-12-18 (×5): qty 250

## 2020-12-18 MED ORDER — HYDRALAZINE HCL 25 MG PO TABS
25.0000 mg | ORAL_TABLET | Freq: Three times a day (TID) | ORAL | Status: DC
  Administered 2020-12-18: 25 mg via ORAL
  Filled 2020-12-18: qty 1

## 2020-12-18 MED ORDER — CHLORHEXIDINE GLUCONATE CLOTH 2 % EX PADS
6.0000 | MEDICATED_PAD | Freq: Every day | CUTANEOUS | Status: DC
  Administered 2020-12-18 – 2020-12-24 (×7): 6 via TOPICAL

## 2020-12-18 MED ORDER — NITROGLYCERIN 0.4 MG/HR TD PT24
0.4000 mg | MEDICATED_PATCH | Freq: Every day | TRANSDERMAL | Status: DC
  Administered 2020-12-18 – 2020-12-20 (×3): 0.4 mg via TRANSDERMAL
  Filled 2020-12-18 (×4): qty 1

## 2020-12-18 MED ORDER — PROSOURCE TF PO LIQD
45.0000 mL | Freq: Two times a day (BID) | ORAL | Status: DC
  Administered 2020-12-18: 45 mL
  Filled 2020-12-18: qty 45

## 2020-12-18 NOTE — Progress Notes (Signed)
SLP Cancellation Note  Patient Details Name: Belva Bertin Doe MRN: [id number] DOB: 12/01/1873   Cancelled treatment:  SLP order received. Pt is currently still on the vent. Will follow up for evaluation post-extubation.          Stefano Trulson L. Samson Frederic, MA CCC/SLP Acute Rehabilitation Services Office number [phone] Pager [phone]   Blenda Mounts Laurice 12/18/2020, 10:31 AM

## 2020-12-18 NOTE — Consult Note (Signed)
Cardiology Consultation:   Patient ID: [name]; [id number]; 12/01/1873  Actual Name: [name]  Male, 55 y.o., 12/23/1965 MRN: [id number]  Admit date: 12/17/2020 Date of Consult: 12/18/2020  Primary Care Provider: No primary care provider on file. Primary Cardiologist: Minus Breeding, MD 03/2019 in-hospital Primary Electrophysiologist:  None   Patient Profile:   [name] is a 55 y.o. male with no known history who is being seen today for the evaluation of cardiomyopathy at the request of Dr Leonie Man.  An inhaler was found in his pocket w/ name, DOB His name is [name], dob [birth year] He was hospitalized 01/07-01/08/2021 for R great toe ulcer >> still present, further confirming his identity He has been homeless since 2013 or earlier.   Multiple ER visits for suicidal ideation, some admissions for same. Documented history of patient refusing meds and other help.  His initial CHF diagnosis was 2018, EF 35-40%.  Initial Echo in our system was 03/2019, EF 15%.  EF improved to 40-45% by 07/2020.   Admitted last for CHF 12/14-12/17/2021, d/c wt 199 lbs Admitted 01/07-01/08/2021 for diabetic R great toe ulcer, wt 200 lbs.   History of Present Illness:   [name] was in the [facility] at 12:30 yesterday and collapsed. EMS called, initially for chest pain, then pt had MS changes.   Pt admitted 01/17 as a code stroke. He was intubated for airway protection and placed on the vent. UDS +cocaine.  [name] is intubated and sedated, not able to answer questions.  He moved both feet in response to touch, full neuro exam not performed.   He is tolerating the vent well, oxygenation good.   From MRN [id number]: PMH . Diabetic foot ulcer ([place]) 12/08/2020  . CHF (congestive heart failure) ([place]) 11/14/2020  . CHF exacerbation ([place]) 11/13/2020  . Hypokalemia   . Hyperlipidemia   . Acute respiratory failure with hypoxia ([place]) 10/26/2020  . COPD with acute  exacerbation ([place]) 10/14/2020  . Acute on chronic combined systolic and diastolic CHF (congestive heart failure) ([place]) 10/02/2020  . Near syncope 07/21/2020  . Schizoaffective disorder ([place]) 06/24/2019  . SOB (shortness of breath)   . QT prolongation 05/25/2019  . Cocaine abuse ([place])   . Elevated troponin   . Homelessness   . Suicidal ideation   . Bilateral foot pain 04/05/2019  . Housing situation unstable 03/28/2019  . Bilateral lower extremity edema   . Asthma 12/26/2016  . Tobacco use disorder 12/26/2016  . Schizophrenia ([place]) 12/26/2016  . Cocaine abuse with cocaine-induced mood disorder ([place]) 09/06/2016  . Alcohol use disorder, mild, abuse 07/31/2016  . Substance induced mood disorder ([name]) 07/30/2016  . Schizoaffective disorder, bipolar type ([place]) 05/31/2016  . Cocaine use disorder, severe, dependence ([place]) 05/31/2016  . Essential hypertension      Past Surgical History:  Procedure Laterality Date  . RADIOLOGY WITH ANESTHESIA N/A 12/17/2020   Procedure: IR WITH ANESTHESIA;  Surgeon: Radiologist, Medication, MD;  Location: [place];  Service: Radiology;  Laterality: N/A;     Prior to Admission medications   None known    Inpatient Medications: Scheduled Meds: . aspirin  81 mg Per Tube Daily  . atorvastatin  40 mg Per Tube QHS  . chlorhexidine gluconate (MEDLINE KIT)  15 mL Mouth Rinse BID  . Chlorhexidine Gluconate Cloth  6 each Topical Daily  . docusate  100 mg Per Tube BID  . enoxaparin (LOVENOX) injection  40 mg Subcutaneous Q24H  . feeding supplement (PROSource  TF)  45 mL Per Tube TID  . hydrALAZINE  25 mg Per Tube TID  . LORazepam  1 mg Intravenous Once  . mouth rinse  15 mL Mouth Rinse 10 times per day  . nitroGLYCERIN  0.4 mg Transdermal Daily  . pantoprazole (PROTONIX) IV  40 mg Intravenous QHS  . polyethylene glycol  17 g Per Tube Daily  . silver sulfADIAZINE   Topical BID   Continuous Infusions: . sodium chloride 75 mL/hr at 12/18/20 0700  .  sodium chloride    . feeding supplement (VITAL AF 1.2 CAL) 1,000 mL (12/18/20 1353)  . propofol (DIPRIVAN) infusion 50 mcg/kg/min (12/18/20 1018)   PRN Meds: acetaminophen **OR** acetaminophen (TYLENOL) oral liquid 160 mg/5 mL **OR** acetaminophen, fentaNYL (SUBLIMAZE) injection, fentaNYL (SUBLIMAZE) injection, ondansetron (ZOFRAN) IV  Allergies:   Reported allergies to ibuprofen and acetaminophen under other MRN  Social History:  No other information on Soc/Fam/ROS due to pt condition. He is homeless and could not/refused to give info in the past. Social History   Socioeconomic History  . Marital status: Not on file    Spouse name: Not on file  . Number of children: Not on file  . Years of education: Not on file  . Highest education level: Not on file  Occupational History  . Not on file  Tobacco Use  . Smoking status: Not on file  . Smokeless tobacco: Not on file  Substance and Sexual Activity  . Alcohol use: Not on file  . Drug use: Not on file  . Sexual activity: Not on file  Other Topics Concern  . Not on file  Social History Narrative  . Not on file   Social Determinants of Health   Financial Resource Strain: Not on file  Food Insecurity: Not on file  Transportation Needs: Not on file  Physical Activity: Not on file  Stress: Not on file  Social Connections: Not on file  Intimate Partner Violence: Not on file    Family History:   No family history on file. Family Status:  No family status information on file.    ROS:  Please see the history of present illness.  All other ROS reviewed and negative.     Physical Exam/Data:   Vitals:   12/18/20 0800 12/18/20 0900 12/18/20 1000 12/18/20 1100  BP: (!) 138/100 (!) 136/95 (!) 141/107   Pulse: 92 93 90   Resp: (!) 26 (!) 23 (!) 24   Temp: 98.24 F (36.8 C) 98.6 F (37 C) 98.24 F (36.8 C)   TempSrc:      SpO2: 98% 99% 100% 100%  Weight:      Height:        Intake/Output Summary (Last 24 hours) at  12/18/2020 1403 Last data filed at 12/18/2020 0700 Gross per 24 hour  Intake 1694.33 ml  Output 325 ml  Net 1369.33 ml    Last 3 Weights 12/17/2020  Weight (lbs) 209 lb 10.5 oz  Weight (kg) 95.1 kg     Body mass index is 27.66 kg/m.   General:  Well nourished, well developed, male in no acute distress HEENT: normal Lymph: no adenopathy Neck: JVD - not seen elevated Endocrine:  No thryomegaly Vascular: No carotid bruits; 4/4 extremity pulses 2+  Cardiac:  normal S1, S2; RRR; no murmur Lungs:  clear anteriorly, no wheezing, rhonchi or rales  Abd: soft, nontender, no hepatomegaly  Ext: 1-2+ pedal edema Musculoskeletal:  No deformities, BUE and BLE strength not tested Skin:  warm and dry  Neuro:  Not able to evaluated due to sedation  EKG:  The EKG was personally reviewed and demonstrates:  SR, HR 92, Biatrial enlargement, LVH Telemetry:  Telemetry was personally reviewed and demonstrates:  SR, PVCs and pairs   CV studies:   ECHO: 0/18/2022 1. Left ventricular ejection fraction, by estimation, is <20%. The left  ventricle has severely decreased function. The left ventricle demonstrates  global hypokinesis. The left ventricular internal cavity size was  moderately dilated. There is moderate  left ventricular hypertrophy. Left ventricular diastolic parameters are  consistent with Grade II diastolic dysfunction (pseudonormalization).  Elevated left atrial pressure.  2. Right ventricular systolic function is moderately reduced. The right  ventricular size is mildly enlarged.  3. Left atrial size was moderately dilated.  4. Right atrial size was moderately dilated.  5. The mitral valve is normal in structure. Trivial mitral valve  regurgitation. No evidence of mitral stenosis.  6. The aortic valve is tricuspid. Aortic valve regurgitation is not  visualized. Mild aortic valve sclerosis is present, with no evidence of  aortic valve stenosis.  7. The inferior vena cava is  normal in size with greater than 50%  respiratory variability, suggesting right atrial pressure of 3 mmHg.   ECHO: 09/26/2020 1. Left ventricular ejection fraction, by estimation, is 40 to 45%. The  left ventricle has mildly decreased function. The left ventricle  demonstrates global hypokinesis. The left ventricular internal cavity size  was mildly dilated. There is mild concentric left ventricular hypertrophy. Left ventricular diastolic parameters are consistent with Grade II diastolic dysfunction (pseudonormalization).  2. Right ventricular systolic function is normal. The right ventricular  size is normal.  3. Left atrial size was moderately dilated.  4. Right atrial size was mildly dilated.  5. The mitral valve is normal in structure. Trivial mitral valve  regurgitation. No evidence of mitral stenosis.  6. The aortic valve is tricuspid. There is mild calcification of the  aortic valve. Aortic valve regurgitation is not visualized. Mild aortic  valve sclerosis is present, with no evidence of aortic valve stenosis.  7. The inferior vena cava is normal in size with greater than 50%  respiratory variability, suggesting right atrial pressure of 3 mmHg.   CARDIAC CT: 04/25/2020 iMPRESSION: 1. Coronary artery disease with moderate to severe luminal narrowing at the right coronary, LAD and circumflex. Consider functional assessment.  2. Additional findings.    FINDINGS:  Morphologic elevation of the heart:   The left atrium, right atrium are mildly enlarged. The right ventricle is not enlarged. The left ventricle is enlarged and the left ventricular wall thickness measures 75m. There is no evidence of filling defects within the left atrial appendage. All pulmonary veins drain into the left atrium. Tricuspid aortic valve with no calcifications. The mitral valve shows no calcifications. No abnormal pericardial effusion.   Evaluation of the thoracic aorta:   The ascending  aorta measures 34 mm. The descending aorta measures 27 mm.   No acute aortic syndrome is identified.   Evaluation of the coronary arterial system:   Coronary Anatomy: Classic anatomy with right-sided dominance   1. Right coronary artery: Multiple calcified plaques are noted in the proximal mid and distal segments causing moderate to severe luminal narrowing.  2. Posterior descending artery:Nondiagnostic  3. Posterolateral branch:No significant stenosis.  4. Left main: Isolated calcifications causing mild luminal narrowing.  5. LAD: Multiple proximal mid and distal segments calcified plaques causing moderate to severe luminal narrowing.  6.  Left circumflex:Multiple calcifications causing moderate to severeluminal narrowing.  Non-cardiac findings:    CARDIAC CATH: 10/07/2019 Impressions:   Diffuse atherosclerosis throughout all coronary distributions  Worst lesion is a 60-70% of the prox LCx  Degree of CM is greater than degree of CAD, suggesting a non-ischemic etiology      Laboratory Data:   Chemistry Recent Labs  Lab 12/17/20 1310 12/17/20 1312 12/17/20 1624 12/18/20 0336  NA 138 137 137 137  K 4.3 4.3 3.8 4.6  CL 102 105  --  104  CO2 22  --   --  22  GLUCOSE 108* 105*  --  83  BUN 17 21  --  16  CREATININE 1.32* 1.10  --  1.31*  CALCIUM 8.6*  --   --  8.0*  GFRNONAA 36*  --   --  37*  ANIONGAP 14  --   --  11    Lab Results  Component Value Date   ALT 284 (H) 12/18/2020   AST 174 (H) 12/18/2020   ALKPHOS 129 (H) 12/18/2020   BILITOT 1.1 12/18/2020   Hematology Recent Labs  Lab 12/17/20 1310 12/17/20 1312 12/17/20 1624 12/18/20 0336  WBC 9.8  --   --  11.8*  RBC 4.59  --   --  4.20*  HGB 15.0 16.3 13.6 13.6  HCT 45.4 48.0 40.0 42.0  MCV 98.9  --   --  100.0  MCH 32.7  --   --  32.4  MCHC 33.0  --   --  32.4  RDW 15.7*  --   --  15.6*  PLT 396  --   --  317   Cardiac Enzymes High Sensitivity Troponin:   Recent Labs  Lab 12/17/20 1558  12/17/20 1800 12/18/20 1034  TROPONINIHS 46* 45* 42*      BNPNo results for input(s): BNP, PROBNP in the last 168 hours.  DDimer No results for input(s): DDIMER in the last 168 hours. TSH:  Lab Results  Component Value Date   TSH 3.172 12/17/2020   Lipids: Lab Results  Component Value Date   CHOL 116 12/18/2020   HDL 26 (L) 12/18/2020   LDLCALC 72 12/18/2020   TRIG 88 12/18/2020   CHOLHDL 4.5 12/18/2020   HgbA1c: Lab Results  Component Value Date   HGBA1C 6.4 (H) 12/18/2020   Magnesium:  Magnesium  Date Value Ref Range Status  12/18/2020 1.8 1.7 - 2.4 mg/dL Final    Comment:    Performed at [facility], [place] [address], [place], [place] [address]   Drugs of Abuse     Component Value Date/Time   LABOPIA NONE DETECTED 12/17/2020 1800   COCAINSCRNUR POSITIVE (A) 12/17/2020 1800   LABBENZ NONE DETECTED 12/17/2020 1800   AMPHETMU NONE DETECTED 12/17/2020 1800   THCU NONE DETECTED 12/17/2020 1800   LABBARB NONE DETECTED 12/17/2020 1800    Alcohol Level    Component Value Date/Time   ETH <10 12/17/2020 1310    Radiology/Studies:  CT Angio Head W or Wo Contrast  Result Date: 12/17/2020 CLINICAL DATA:  Neuro deficit, acute, stroke suspected EXAM: CT ANGIOGRAPHY HEAD AND NECK TECHNIQUE: Multidetector CT imaging of the head and neck was performed using the standard protocol during bolus administration of intravenous contrast. Multiplanar CT image reconstructions and MIPs were obtained to evaluate the vascular anatomy. Carotid stenosis measurements (when applicable) are obtained utilizing NASCET criteria, using the distal internal carotid diameter as the denominator. CONTRAST:  176m OMNIPAQUE IOHEXOL 350  MG/ML SOLN COMPARISON:  12/17/2020. FINDINGS: Please note evaluation is limited secondary to suboptimal contrast bolus timing. CTA NECK FINDINGS Aortic arch: Standard branching. Aortic arch atheromatous disease. Imaged portion shows no evidence of aneurysm or  dissection. No significant stenosis of the major arch vessel origins. Right carotid system: Patent. Carotid bifurcation atheromatous disease with 50% narrowing of the carotid bulb. Left carotid system: Patent. Carotid bifurcation atheromatous disease with 30% narrowing of the carotid bulb. Vertebral arteries: Dominant right vertebral artery. The right vertebral artery is patent. Likely retrograde filling of the distal left V4 segment. There is non opacification of the left vertebral artery proximally. Skeleton: No acute finding.  Multilevel spondylosis. Other neck: No adenopathy.  No soft tissue mass. Upper chest: Please see concurrent CTA chest for findings below the thoracic inlet. Review of the MIP images confirms the above findings CTA HEAD FINDINGS Anterior circulation: Patent ICAs. Bilateral carotid siphon atherosclerotic calcifications without significant narrowing. Patent MCAs. Right A1 segment hypoplasia. Patent ACAs. Patent basilar and proximal superior cerebellar arteries. Patent posterior cerebral arteries. Mild narrowing of the left P2 segment. Posterior circulation: Patent right vertebral artery. Retrograde filling of the distal left V4 segment. Venous sinuses: As permitted by contrast timing, patent. Anatomic variants: Please see above. Review of the MIP images confirms the above findings IMPRESSION: Non opacification of the left vertebral artery with retrograde filling of the distal left V4 segment. Bilateral carotid bifurcation atheromatous disease with 50% right and 30% left carotid bulb narrowing. Mild left P2 segment narrowing. Sub optimal contrast bolus timing limits evaluation. Code stroke imaging results were communicated on 12/17/2020 at 2:17 pm to provider Dr. Leonie Man via secure text paging. Electronically Signed   By: Primitivo Gauze M.D.   On: 12/17/2020 14:18   CT Angio Neck W and/or Wo Contrast  Result Date: 12/17/2020 CLINICAL DATA:  Neuro deficit, acute, stroke suspected EXAM: CT  ANGIOGRAPHY HEAD AND NECK TECHNIQUE: Multidetector CT imaging of the head and neck was performed using the standard protocol during bolus administration of intravenous contrast. Multiplanar CT image reconstructions and MIPs were obtained to evaluate the vascular anatomy. Carotid stenosis measurements (when applicable) are obtained utilizing NASCET criteria, using the distal internal carotid diameter as the denominator. CONTRAST:  162m OMNIPAQUE IOHEXOL 350 MG/ML SOLN COMPARISON:  12/17/2020. FINDINGS: Please note evaluation is limited secondary to suboptimal contrast bolus timing. CTA NECK FINDINGS Aortic arch: Standard branching. Aortic arch atheromatous disease. Imaged portion shows no evidence of aneurysm or dissection. No significant stenosis of the major arch vessel origins. Right carotid system: Patent. Carotid bifurcation atheromatous disease with 50% narrowing of the carotid bulb. Left carotid system: Patent. Carotid bifurcation atheromatous disease with 30% narrowing of the carotid bulb. Vertebral arteries: Dominant right vertebral artery. The right vertebral artery is patent. Likely retrograde filling of the distal left V4 segment. There is non opacification of the left vertebral artery proximally. Skeleton: No acute finding.  Multilevel spondylosis. Other neck: No adenopathy.  No soft tissue mass. Upper chest: Please see concurrent CTA chest for findings below the thoracic inlet. Review of the MIP images confirms the above findings CTA HEAD FINDINGS Anterior circulation: Patent ICAs. Bilateral carotid siphon atherosclerotic calcifications without significant narrowing. Patent MCAs. Right A1 segment hypoplasia. Patent ACAs. Patent basilar and proximal superior cerebellar arteries. Patent posterior cerebral arteries. Mild narrowing of the left P2 segment. Posterior circulation: Patent right vertebral artery. Retrograde filling of the distal left V4 segment. Venous sinuses: As permitted by contrast timing,  patent. Anatomic variants: Please see above.  Review of the MIP images confirms the above findings IMPRESSION: Non opacification of the left vertebral artery with retrograde filling of the distal left V4 segment. Bilateral carotid bifurcation atheromatous disease with 50% right and 30% left carotid bulb narrowing. Mild left P2 segment narrowing. Sub optimal contrast bolus timing limits evaluation. Code stroke imaging results were communicated on 12/17/2020 at 2:17 pm to provider Dr. Leonie Man via secure text paging. Electronically Signed   By: Primitivo Gauze M.D.   On: 12/17/2020 14:18   [name] ANGIO HEAD WO CONTRAST  Result Date: 12/17/2020 CLINICAL DATA:  Follow-up examination for acute stroke, status post catheter directed arteriogram. EXAM: MRI HEAD WITHOUT CONTRAST MRA HEAD WITHOUT CONTRAST TECHNIQUE: Multiplanar, multiecho pulse sequences of the brain and surrounding structures were obtained without intravenous contrast. Angiographic images of the head were obtained using MRA technique without contrast. COMPARISON:  Prior CTs and arteriogram from earlier same day. FINDINGS: MRI HEAD FINDINGS Brain: Examination mildly degraded by motion artifact. Cerebral volume within normal limits. Patchy and confluent T2/FLAIR hyperintensity within the periventricular and deep white matter both cerebral hemispheres most consistent with chronic small vessel ischemic disease, mild to moderate in nature. Encephalomalacia and gliosis involving the mid and inferior left cerebellum without residual diffusion abnormality, consistent with a chronic ischemic infarct, left PICA distribution. Area of confluent restricted diffusion measuring approximately 4.1 x 3.1 cm seen involving the superior right cerebellum, consistent with an acute ischemic infarct, right superior cerebellar artery territory (series 2, image 13). No associated hemorrhage or significant regional mass effect. No involvement of the adjacent brainstem. No other  diffusion abnormality to suggest acute or subacute ischemia elsewhere within the brain. Gray-white matter differentiation otherwise maintained. No other areas of remote cortical infarction. No evidence for acute intracranial hemorrhage. Single punctate focus of susceptibility artifact noted at the superior left frontal lobe (series 7, image 75), likely a small chronic microhemorrhage, of doubtful significance in isolation. No mass lesion, midline shift or mass effect. Ventricles normal size without hydrocephalus. No extra-axial fluid collection. Pituitary gland and suprasellar region within normal limits. Midline structures intact. Vascular: Abnormal flow void within the left V4 segment, which could be related to slow flow and/or occlusion. Major intracranial vascular flow voids are otherwise maintained. Skull and upper cervical spine: Craniocervical junction within normal limits. Upper cervical spine normal. Bone marrow signal intensity within normal limits. No scalp soft tissue abnormality. Sinuses/Orbits: Globes and orbital soft tissues within normal limits. Scattered mucosal thickening noted throughout the paranasal sinuses. Layering fluid noted within the nasopharynx. Orogastric tube partially visualized. Mastoid air cells remain clear. Inner ear structures grossly normal. Other: None. MRA HEAD FINDINGS ANTERIOR CIRCULATION: Visualized distal cervical segments of the internal carotid arteries are patent with antegrade flow. Petrous, cavernous, and supraclinoid segments widely patent without stenosis or other abnormality. A1 segments patent bilaterally. Right A1 hypoplastic, accounting for the diminutive right ICA seen prior to the left. Normal anterior communicating artery complex. Both anterior cerebral arteries widely patent to their distal aspects without stenosis. No M1 stenosis or occlusion. Normal left MCA bifurcation. Right MCA trifurcates. Distal MCA branches well perfused and symmetric bilaterally.  POSTERIOR CIRCULATION: Dominant right V4 segment patent to the vertebrobasilar junction without stenosis. Right PICA origin patent and normal. Attenuated but patent flow within the left V4 segment, which could reflect a proximal stenosis. Left PICA origin patent and normal. Short fenestration involving the proximal basilar artery noted. Basilar patent to its distal aspect without stenosis. Dominant right AICA. Left superior cerebellar artery patent. Right  superior cerebellar artery is occluded, presumably acute in nature given the acute right ACA territory infarct. Both PCAs primarily supplied via the basilar. Small bilateral posterior communicating arteries noted. Visualized portions of the PCAs are patent to their distal aspects without stenosis. No aneurysm. IMPRESSION: MRI HEAD IMPRESSION: 1. 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. No associated mass effect. 2. Chronic left PICA distribution infarct. Abnormal flow void within the left V4 segment consistent with slow flow as seen on corresponding MRA. 3. Underlying mild-to-moderate chronic microvascular ischemic disease. MRA HEAD IMPRESSION: 1. Acute occlusion of the right superior cerebellar artery. 2. Attenuated but patent flow within the left V4 segment, suggesting a proximal stenosis. 3. Otherwise wide patency of the major intracranial arterial circulation. No other hemodynamically significant or correctable stenosis. Electronically Signed   By: Jeannine Boga M.D.   On: 12/17/2020 22:43   [name] BRAIN WO CONTRAST  Result Date: 12/17/2020 CLINICAL DATA:  Follow-up examination for acute stroke, status post catheter directed arteriogram. EXAM: MRI HEAD WITHOUT CONTRAST MRA HEAD WITHOUT CONTRAST TECHNIQUE: Multiplanar, multiecho pulse sequences of the brain and surrounding structures were obtained without intravenous contrast. Angiographic images of the head were obtained using MRA technique without contrast.  COMPARISON:  Prior CTs and arteriogram from earlier same day. FINDINGS: MRI HEAD FINDINGS Brain: Examination mildly degraded by motion artifact. Cerebral volume within normal limits. Patchy and confluent T2/FLAIR hyperintensity within the periventricular and deep white matter both cerebral hemispheres most consistent with chronic small vessel ischemic disease, mild to moderate in nature. Encephalomalacia and gliosis involving the mid and inferior left cerebellum without residual diffusion abnormality, consistent with a chronic ischemic infarct, left PICA distribution. Area of confluent restricted diffusion measuring approximately 4.1 x 3.1 cm seen involving the superior right cerebellum, consistent with an acute ischemic infarct, right superior cerebellar artery territory (series 2, image 13). No associated hemorrhage or significant regional mass effect. No involvement of the adjacent brainstem. No other diffusion abnormality to suggest acute or subacute ischemia elsewhere within the brain. Gray-white matter differentiation otherwise maintained. No other areas of remote cortical infarction. No evidence for acute intracranial hemorrhage. Single punctate focus of susceptibility artifact noted at the superior left frontal lobe (series 7, image 75), likely a small chronic microhemorrhage, of doubtful significance in isolation. No mass lesion, midline shift or mass effect. Ventricles normal size without hydrocephalus. No extra-axial fluid collection. Pituitary gland and suprasellar region within normal limits. Midline structures intact. Vascular: Abnormal flow void within the left V4 segment, which could be related to slow flow and/or occlusion. Major intracranial vascular flow voids are otherwise maintained. Skull and upper cervical spine: Craniocervical junction within normal limits. Upper cervical spine normal. Bone marrow signal intensity within normal limits. No scalp soft tissue abnormality. Sinuses/Orbits: Globes  and orbital soft tissues within normal limits. Scattered mucosal thickening noted throughout the paranasal sinuses. Layering fluid noted within the nasopharynx. Orogastric tube partially visualized. Mastoid air cells remain clear. Inner ear structures grossly normal. Other: None. MRA HEAD FINDINGS ANTERIOR CIRCULATION: Visualized distal cervical segments of the internal carotid arteries are patent with antegrade flow. Petrous, cavernous, and supraclinoid segments widely patent without stenosis or other abnormality. A1 segments patent bilaterally. Right A1 hypoplastic, accounting for the diminutive right ICA seen prior to the left. Normal anterior communicating artery complex. Both anterior cerebral arteries widely patent to their distal aspects without stenosis. No M1 stenosis or occlusion. Normal left MCA bifurcation. Right MCA trifurcates. Distal MCA branches well perfused and symmetric bilaterally. POSTERIOR CIRCULATION:  Dominant right V4 segment patent to the vertebrobasilar junction without stenosis. Right PICA origin patent and normal. Attenuated but patent flow within the left V4 segment, which could reflect a proximal stenosis. Left PICA origin patent and normal. Short fenestration involving the proximal basilar artery noted. Basilar patent to its distal aspect without stenosis. Dominant right AICA. Left superior cerebellar artery patent. Right superior cerebellar artery is occluded, presumably acute in nature given the acute right ACA territory infarct. Both PCAs primarily supplied via the basilar. Small bilateral posterior communicating arteries noted. Visualized portions of the PCAs are patent to their distal aspects without stenosis. No aneurysm. IMPRESSION: MRI HEAD IMPRESSION: 1. 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. No associated mass effect. 2. Chronic left PICA distribution infarct. Abnormal flow void within the left V4 segment consistent with slow flow as  seen on corresponding MRA. 3. Underlying mild-to-moderate chronic microvascular ischemic disease. MRA HEAD IMPRESSION: 1. Acute occlusion of the right superior cerebellar artery. 2. Attenuated but patent flow within the left V4 segment, suggesting a proximal stenosis. 3. Otherwise wide patency of the major intracranial arterial circulation. No other hemodynamically significant or correctable stenosis. Electronically Signed   By: Jeannine Boga M.D.   On: 12/17/2020 22:43   DG Abd Portable 1V  Result Date: 12/17/2020 CLINICAL DATA:  OG tube placement EXAM: PORTABLE ABDOMEN - 1 VIEW COMPARISON:  None. FINDINGS: Nonobstructed gas pattern. Excreted contrast within the renal collecting systems. Esophageal tube tip and side port overlie the proximal stomach. IMPRESSION: Esophageal tube tip overlies the proximal stomach. Electronically Signed   By: Donavan Foil M.D.   On: 12/17/2020 17:59   EEG adult  Result Date: 12/17/2020 Lora Havens, MD     12/17/2020  5:07 PM Patient Name: [name] MRN: [id number] Epilepsy Attending: Lora Havens Referring Physician/Provider: Clance Boll, NP Date: 12/17/2020 Duration: 24.25 mins Patient history: unknown age male with ams, left gaze preference. EEG to evaluate for seizure. Level of alertness: comatose AEDs during EEG study: Propofol Technical aspects: This EEG study was done with scalp electrodes positioned according to the 10-20 International system of electrode placement. Electrical activity was acquired at a sampling rate of '500Hz'  and reviewed with a high frequency filter of '70Hz'  and a low frequency filter of '1Hz' . EEG data were recorded continuously and digitally stored. Description: EEG showed continuous generalized 8-'10Hz'  alpha activity. EEG was not reactive to tactile stimulation. Hyperventilation and photic stimulation were not performed.   ABNORMALITY - Alpha coma IMPRESSION: This study is suggestive of severe diffuse encephalopathy,  nonspecific etiology but could be secondary to sedation, toxic-metabolic etiology, anoxic/hypoxic brain injury. No seizures or epileptiform discharges were seen throughout the recording. Dr Leonie Man was notified Lora Havens   ECHOCARDIOGRAM COMPLETE  Result Date: 12/18/2020    ECHOCARDIOGRAM REPORT   Patient Name:   [name] Date of Exam: 12/18/2020 Medical Rec #:  [id number]       Height:       73.0 in Accession #:    [id number]      Weight:       209.7 lb Date of Birth:  12/01/1873      BSA:          2.195 m Patient Age:    89 years       BP:           136/96 mmHg Patient Gender: M  HR:           92 bpm. Exam Location:  Inpatient Procedure: 2D Echo, Cardiac Doppler and Color Doppler Indications:    Stroke  History:        Patient has no prior history of Echocardiogram examinations.  Sonographer:    Clayton Lefort RDCS (AE) Referring Phys: [place]  1. Left ventricular ejection fraction, by estimation, is <20%. The left ventricle has severely decreased function. The left ventricle demonstrates global hypokinesis. The left ventricular internal cavity size was moderately dilated. There is moderate left ventricular hypertrophy. Left ventricular diastolic parameters are consistent with Grade II diastolic dysfunction (pseudonormalization). Elevated left atrial pressure.  2. Right ventricular systolic function is moderately reduced. The right ventricular size is mildly enlarged.  3. Left atrial size was moderately dilated.  4. Right atrial size was moderately dilated.  5. The mitral valve is normal in structure. Trivial mitral valve regurgitation. No evidence of mitral stenosis.  6. The aortic valve is tricuspid. Aortic valve regurgitation is not visualized. Mild aortic valve sclerosis is present, with no evidence of aortic valve stenosis.  7. The inferior vena cava is normal in size with greater than 50% respiratory variability, suggesting right atrial pressure of 3  mmHg. FINDINGS  Left Ventricle: Left ventricular ejection fraction, by estimation, is <20%. The left ventricle has severely decreased function. The left ventricle demonstrates global hypokinesis. The left ventricular internal cavity size was moderately dilated. There is moderate left ventricular hypertrophy. Left ventricular diastolic parameters are consistent with Grade II diastolic dysfunction (pseudonormalization). Elevated left atrial pressure. Right Ventricle: The right ventricular size is mildly enlarged.Right ventricular systolic function is moderately reduced. Left Atrium: Left atrial size was moderately dilated. Right Atrium: Right atrial size was moderately dilated. Pericardium: There is no evidence of pericardial effusion. Mitral Valve: The mitral valve is normal in structure. Trivial mitral valve regurgitation. No evidence of mitral valve stenosis. Tricuspid Valve: The tricuspid valve is normal in structure. Tricuspid valve regurgitation is trivial. No evidence of tricuspid stenosis. Aortic Valve: The aortic valve is tricuspid. Aortic valve regurgitation is not visualized. Mild aortic valve sclerosis is present, with no evidence of aortic valve stenosis. Aortic valve mean gradient measures 2.0 mmHg. Aortic valve peak gradient measures 3.9 mmHg. Pulmonic Valve: The pulmonic valve was normal in structure. Pulmonic valve regurgitation is not visualized. No evidence of pulmonic stenosis. Aorta: The aortic root is normal in size and structure. Venous: The inferior vena cava is normal in size with greater than 50% respiratory variability, suggesting right atrial pressure of 3 mmHg. IAS/Shunts: There is right bowing of the interatrial septum, suggestive of elevated left atrial pressure. No atrial level shunt detected by color flow Doppler.  LEFT VENTRICLE PLAX 2D LVIDd:         6.00 cm  Diastology LVIDs:         5.60 cm  LV e' medial:    3.16 cm/s LV PW:         1.40 cm  LV E/e' medial:  28.5 LV IVS:         1.30 cm  LV e' lateral:   6.18 cm/s LVOT diam:     2.00 cm  LV E/e' lateral: 14.6 LV SV:         28 LV SV Index:   13 LVOT Area:     3.14 cm  3D Volume EF:                         3D EF:        25 %                         LV EDV:       254 ml                         LV ESV:       190 ml                         LV SV:        63 ml RIGHT VENTRICLE            IVC RV Basal diam:  4.30 cm    IVC diam: 1.40 cm RV Mid diam:    3.80 cm RV S prime:     6.94 cm/s TAPSE (M-mode): 1.0 cm LEFT ATRIUM              Index       RIGHT ATRIUM           Index LA diam:        4.30 cm  1.96 cm/m  RA Area:     26.40 cm LA Vol (A2C):   116.0 ml 52.84 ml/m RA Volume:   92.10 ml  41.96 ml/m LA Vol (A4C):   82.6 ml  37.63 ml/m LA Biplane Vol: 98.0 ml  44.64 ml/m  AORTIC VALVE AV Area (Vmax):    1.77 cm AV Area (Vmean):   1.55 cm AV Area (VTI):     1.78 cm AV Vmax:           98.70 cm/s AV Vmean:          75.200 cm/s AV VTI:            0.159 m AV Peak Grad:      3.9 mmHg AV Mean Grad:      2.0 mmHg LVOT Vmax:         55.50 cm/s LVOT Vmean:        37.000 cm/s LVOT VTI:          0.090 m LVOT/AV VTI ratio: 0.57  AORTA Ao Root diam: 3.60 cm Ao Asc diam:  3.60 cm MITRAL VALVE               TRICUSPID VALVE MV Area (PHT): 4.06 cm    TR Peak grad:   35.0 mmHg MV Decel Time: 187 msec    TR Vmax:        296.00 cm/s MV E velocity: 90.00 cm/s MV A velocity: 64.30 cm/s  SHUNTS MV E/A ratio:  1.40        Systemic VTI:  0.09 m                            Systemic Diam: 2.00 cm Kirk Ruths MD Electronically signed by Kirk Ruths MD Signature Date/Time: 12/18/2020/10:41:53 AM    Final    CT HEAD CODE STROKE WO CONTRAST  Result Date: 12/17/2020 CLINICAL DATA:  Code stroke.  Neuro deficit, acute, stroke suspected EXAM: CT HEAD WITHOUT CONTRAST TECHNIQUE: Contiguous axial images were obtained from the base of the skull through the vertex without intravenous  contrast. COMPARISON:  None. FINDINGS: Brain: No intracranial  hemorrhage. Focal left cerebellar hypodensity. No mass lesion. No midline shift, ventriculomegaly or extra-axial fluid collection. Vascular: No hyperdense vessel or unexpected calcification. Skull: Negative for fracture or focal lesion. Sinuses/Orbits: Normal orbits. Chronic defect of the medial left orbital wall. Clear paranasal sinuses. No mastoid effusion. Other: Small right parietal scalp hematoma. ASPECTS [facility] Stroke Program Early CT Score) - Ganglionic level infarction (caudate, lentiform nuclei, internal capsule, insula, M1-M3 cortex): 7 - Supraganglionic infarction (M4-M6 cortex): 3 Total score (0-10 with 10 being normal): 10 IMPRESSION: 1. Left cerebellar hypodensity concerning for acute/subacute infarct. 2. No intracranial hemorrhage. 3. ASPECTS is 10 4. Small right parietal scalp hematoma. 5. Code stroke imaging results were communicated on 12/17/2020 at 1:32 pm to provider Dr. Leonie Man via secure text paging. Electronically Signed   By: Primitivo Gauze M.D.   On: 12/17/2020 13:35    Assessment and Plan:   1. Cardiomyopathy:  - EF < 20% w/ mod LVH, grade 2 dd, RV function mod reduced - RA pressure 3, PAS not seen on echo report, MD to review - no CXR this admit, no obvious CHF exacerbation on exam, except for pedal edema - do not think he needs diuresis now - at this time, BP range 144/107-129/90  - he is on hydralazine 25 mg tid - MD advise on adding Coreg 3.125 mg bid and/or Lasix 20 mg qd - other meds once pt general medical condition improves  2. CAD:  - cardiac CT report above, pt w/ CAD but left AMA 04/26/2020 before cath performed, has not had PCI - med rx for CAD w/ ASA, BB, no statin as LFTs are abnl  Stroke and other issues, per Dr Leonie Man Active Problems:   Stroke [facility])   Basilar artery embolus     For questions or updates, please contact [facility] Please consult [email or web address] for contact info under Cardiology/STEMI.   Jonetta Speak, PA-C   12/18/2020 2:03 PM

## 2020-12-18 NOTE — Progress Notes (Signed)
Inpatient Diabetes Program Recommendations  AACE/ADA: New Consensus Statement on Inpatient Glycemic Control (2015)  Target Ranges:  Prepandial:   less than 140 mg/dL      Peak postprandial:   less than 180 mg/dL (1-2 hours)      Critically ill patients:  140 - 180 mg/dL   Results for [name] [facility] (MRN [id number]) as of 12/18/2020 06:41  Ref. Range 12/17/2020 13:06 12/17/2020 13:16 12/17/2020 13:53  Glucose-Capillary Latest Ref Range: 70 - 99 mg/dL 54 (L) 49 (L) 767 (H)   Admit with: Code Stroke  History: Unknown (questionable DM and CHF per MD notes??)     MD- Please place orders for fingerstick CBGs Q4 hours     --Will follow patient during hospitalization--  [name] RN, MSN, CDE Diabetes Coordinator Inpatient Glycemic Control Team Team Pager: [phone] (8a-5p)

## 2020-12-18 NOTE — Progress Notes (Signed)
PT Cancellation Note  Patient Details Name: [name] MRN: [id number] DOB: 12/01/1873   Cancelled Treatment:    Reason Eval/Treat Not Completed: Medical issues which prohibited therapy - deferred per RN, pt remains intubated. Will check back when medically appropriate.  Marye Round, PT Acute Rehabilitation Services Pager [phone]  Office [phone]    Truddie Coco 12/18/2020, 11:11 AM

## 2020-12-18 NOTE — Progress Notes (Addendum)
STROKE TEAM PROGRESS NOTE   INTERVAL HISTORY Patient evaluated at bedside this morning, no family in the room.  Patient remains unidentified, yesterday during examination mentioned his name [name] but no way to confirm it yet.  This morning patient is intubated, difficult to arouse, obtunded, noncooperative.  PT/OT consulted treatment as patient not considered medically appropriate.  Echocardiogram shows less than 20% ejection fraction.  Cardiology has been consulted, awaiting further recommendation.  Blood pressure well controlled.  Neurological exam unchanged from yesterday evening.  Appreciate PCCM help with this patient care.  Urine drug screen is positive for cocaine and liver enzymes are elevated.  MRI scan confirms right superior cerebellar artery embolic infarct and diagnostic cerebral catheter angiogram showed right right superior cerebellar artery occlusion.  Of note: Patient is identified and merging of charts in in action. Patient has PMHx  significant for DM2, Schizoaffective disorder (bipolar type), COPD, polysubstance abuse, CHF, HTN, medication non-compliance and homelessness, since 2013.  Multiple ER visits and admission for suicidal ideation and medication noncompliance.  Vitals:   12/18/20 0800 12/18/20 0900 12/18/20 1000 12/18/20 1100  BP: (!) 138/100 (!) 136/95 (!) 141/107   Pulse: 92 93 90   Resp: (!) 26 (!) 23 (!) 24   Temp: 98.24 F (36.8 C) 98.6 F (37 C) 98.24 F (36.8 C)   TempSrc:      SpO2: 98% 99% 100% 100%  Weight:      Height:       CBC:  Recent Labs  Lab 12/17/20 1310 12/17/20 1312 12/17/20 1624 12/18/20 0336  WBC 9.8  --   --  11.8*  NEUTROABS 4.9  --   --  8.9*  HGB 15.0   < > 13.6 13.6  HCT 45.4   < > 40.0 42.0  MCV 98.9  --   --  100.0  PLT 396  --   --  317   < > = values in this interval not displayed.   Basic Metabolic Panel:  Recent Labs  Lab 12/17/20 1310 12/17/20 1312 12/17/20 1624 12/18/20 0336  NA 138 137 137 137  K 4.3  4.3 3.8 4.6  CL 102 105  --  104  CO2 22  --   --  22  GLUCOSE 108* 105*  --  83  BUN 17 21  --  16  CREATININE 1.32* 1.10  --  1.31*  CALCIUM 8.6*  --   --  8.0*   Lipid Panel:  Recent Labs  Lab 12/18/20 0336  TRIG 111   HgbA1c: pending Urine Drug Screen:  Recent Labs  Lab 12/17/20 1800  LABOPIA NONE DETECTED  COCAINSCRNUR POSITIVE*  LABBENZ NONE DETECTED  AMPHETMU NONE DETECTED  THCU NONE DETECTED  LABBARB NONE DETECTED    Alcohol Level  Recent Labs  Lab 12/17/20 1310  ETH <10    IMAGING past 24 hours  CT head Code stroke wo contrast 12/17/2020  IMPRESSION: 1. Left cerebellar hypodensity concerning for acute/subacute infarct. 2. No intracranial hemorrhage. 3. ASPECTS is 10 4. Small right parietal scalp hematoma.  CT Angio Head Neck w or wo contrast 12/17/2020  IMPRESSION: Non opacification of the left vertebral artery with retrograde filling of the distal left V4 segment.   Bilateral carotid bifurcation atheromatous disease with 50% right and 30% left carotid bulb narrowing.   Mild left P2 segment narrowing.   Sub optimal contrast bolus timing limits evaluation.  X-ray Abdomen 12/17/2020  IMPRESSION: Esophageal tube tip overlies the proximal stomach.  MRA Head 12/18/2020  IMPRESSION: MRI HEAD IMPRESSION:   1. 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. No associated mass effect. 2. Chronic left PICA distribution infarct. Abnormal flow void within the left V4 segment consistent with slow flow as seen on corresponding MRA. 3. Underlying mild-to-moderate chronic microvascular ischemic disease.   MRA HEAD IMPRESSION:   1. Acute occlusion of the right superior cerebellar artery. 2. Attenuated but patent flow within the left V4 segment, suggesting a proximal stenosis. 3. Otherwise wide patency of the major intracranial arterial circulation. No other hemodynamically significant or  correctable Stenosis  Echocardiogram 12/18/2020  IMPRESSIONS     1. Left ventricular ejection fraction, by estimation, is <20%. The left  ventricle has severely decreased function. The left ventricle demonstrates  global hypokinesis. The left ventricular internal cavity size was  moderately dilated. There is moderate  left ventricular hypertrophy. Left ventricular diastolic parameters are  consistent with Grade II diastolic dysfunction (pseudonormalization).  Elevated left atrial pressure.   2. Right ventricular systolic function is moderately reduced. The right  ventricular size is mildly enlarged.   3. Left atrial size was moderately dilated.   4. Right atrial size was moderately dilated.   5. The mitral valve is normal in structure. Trivial mitral valve  regurgitation. No evidence of mitral stenosis.   6. The aortic valve is tricuspid. Aortic valve regurgitation is not  visualized. Mild aortic valve sclerosis is present, with no evidence of  aortic valve stenosis.   7. The inferior vena cava is normal in size with greater than 50%  respiratory variability, suggesting right atrial pressure of 3 mmHg.     Physical Exam  Constitutional: Appears well-developed and well-nourished.  Psych: Unable to assess. Unresponsive Eyes: No scleral injection HENT: No OP obstrucion Head: Normocephalic.  Cardiovascular: RRR on tele Respiratory: Effort normal.  GI: Soft.  Skin: WDI   Neuro:  Mental Status: Patient is unresponsive to voice and tactile stimulation. Non verbal and follows no commands.  Patient is unable to give a clear and coherent history. Dysconjugate gaze.  With left eyes slightly exotropic but doll's eye movement preserved Speech/Language: non verbal.    Cranial Nerves: II: Pupils are 82mm equal, round, and sluggish.  III,IV, VI: eyes are dysconjugate , doll's eye reflex + V, VII: furrows with brow pressure.  VIII: hearing is intact to loud voice at r/p exam.  IX,  X: + cough XI: head midline XII: pt will not protrude tongue Motor: Tone is flaccid BUE/BLEs. Bulk is normal.  Trace withdrawal in bilateral upper extremity left more than right Sensory: No withdrawal to pain  Plantars: Toes are downgoing bilaterally. Cerebellar: No tremors.  Gait: deferred.   ASSESSMENT/PLAN Mr. Belva Bertin Doe is a 55 y.o. male with a questionable PMHx of CHF and DM II per bystanders who presents to [facility] ED via EMS after chest pain complaint followed by unresponsiveness followed by unresponsiveness and vomiting and EMS noticed right-sided movements but left gaze deviation and left hemiparesis. CT scans showed subacute left cerebellar infarct with possible dense basilar artery sign but CT angiogram was of poor quality due to timing of dye injection hence patient was taken for emergent cerebral catheter angiogram and was intubated for airway protection. IV tPA considered but not given because diagnosis of stroke was not certain given lack of significant focal findings except gaze deviation and CT scan showing a subacute cerebellar infarct patient unable to give clear history. No LVO on diagnostic angiogram. MRI shows acute  ischemic nonhemorrhagic right superior cerebellar artery.   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  His neurological exam and mental status seem very out of proportion to his infarct and underlying encephalopathy from cocaine intoxication, global hypoperfusion and abnormal liver function contributing  code Stroke CT head; No intracranial hemorrhage  CTA head & neck: Non opacification of the left vertebral artery with retrograde filling of the distal left V4 segment.  MRI:  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   2D Echo: Left ventricular ejection fraction, by estimation, is <20%. The left  ventricle has severely  decreased function.   LDL : pending  HgbA1c: pending  EEG: severe diffuse encephalopathy, nonspecific etiology but could be secondary to sedation, toxic-metabolic etiology, anoxic/hypoxic brain injury. No seizures or epileptiform discharges were seen throughout the recording.  VTE prophylaxis -Lovenox 40 mg .    . Diet  .  Marland Kitchen Diet NPO time specified  .   Unavailable  prior to admission, now on aspirin 81 mg daily.   Therapy recommendations: Pending  Disposition: Pending  Hypertension  Home meds: Unavailable  UStable . Permissive hypertension (OK if < 220/120) but gradually normalize in 5-7 days . Long-term BP goal normotensive  Hyperlipidemia  Home meds: Unavailable  Start Lipitor 40 mg  LDL: Pending  Continue statin at discharge  Diabetes type II   Home meds: Unavailable  HgbA1c: Pending  CBGs . Recent Labs  .   . 12/18/20 . 0730 . 12/18/20 . 0732 . 12/18/20 . 1130  . GLUCAP . 43* . 73 . 30*       Other Stroke Risk Factors  Substance abuse : Cocaine POSITIVE. Patient will be advised to stop using due to stroke risk.  ?Hx stroke/TIA  ?Congestive heart failure  Other Active Problems  Acute hypoxic respiratory failure: Continue ventilatory support, lighten sedation as able.  Acute encephalopathy metabolic/toxic: Lab results pending, minimize sedation as able, supportive care, delirium precautions.  ?  Systolic congestive heart failure: Echocardiogram suggestive of severe systolic dysfunction  Transaminitis: Trend LFTs, trend CMP, hepatitis panel pending  Hypoglycemia: Monitor CBG with goal 140-180  Hospital day # 1 I have personally obtained history,examined this patient, reviewed notes, independently viewed imaging studies, participated in medical decision making and plan of care.ROS completed by me personally and pertinent positives fully documented  I have made any additions or clarifications directly to the above note. Agree with note  above.  Patient presented with altered mental status with left gaze deviation and his initial mental status and exam and finding was way out of proportion to her right superior cerebellar artery infarct and cerebral catheter angiogram did not show large vessel occlusion which was not amenable to endovascular therapy.  Continue supportive care for now.  May need IV heparin given his low cardiac ejection fraction and high risk for cardiogenic embolism.  Cardiology consulted to help manage his heart failure.  Follow cardiac enzymes and check echo report.  Continue ventilatory support and extubate as tolerated as per CCM.  Discussed with Dr. Lynnell Catalan critical care medicine.  Discussed with cardiology. This patient is critically ill and at significant risk of neurological worsening, death and care requires constant monitoring of vital signs, hemodynamics,respiratory and cardiac monitoring, extensive review of multiple databases, frequent neurological assessment, discussion with family, other specialists and medical decision making of high complexity.I have made any additions or clarifications directly to the above note.This critical care time  does not reflect procedure time, or teaching time or supervisory time of PA/NP/Med Resident etc but could involve care discussion time.  I spent 30 minutes of neurocritical care time  in the care of  this patient.      Delia Heady, MD Medical Director [facility] Pager: [phone] 12/18/2020 1:30 PM   To contact Stroke Continuity provider, please refer to [email or web address]. After hours, contact General Neurology

## 2020-12-18 NOTE — Progress Notes (Addendum)
ANTICOAGULATION CONSULT NOTE - Initial Consult  Pharmacy Consult for Heparin Indication: stroke  Allergies  Allergen Reactions  . Ibuprofen Anaphylaxis  . Latex Other (See Comments)    Reaction??  . Peanut-Containing Drug Products Other (See Comments)    Allergic to peanut butter (reaction??)  . Tylenol [Acetaminophen] Hives and Nausea And Vomiting    Patient Measurements: Height: 6\' 1"  (185.4 cm) Weight: 95.1 kg (209 lb 10.5 oz) IBW/kg (Calculated) : 79.9   Vital Signs: Temp: 98.78 F (37.1 C) (01/18 1400) Temp Source: Bladder (01/18 0400) BP: 120/91 (01/18 1500) Pulse Rate: 86 (01/18 1500)  Labs: Recent Labs    12/17/20 1310 12/17/20 1312 12/17/20 1558 12/17/20 1624 12/17/20 1800 12/18/20 0336 12/18/20 1034  HGB 15.0 16.3  16.3  --  13.6  --  13.6  --   HCT 45.4 48.0  48.0  --  40.0  --  42.0  --   PLT 396  --   --   --   --  317  --   APTT 27  --   --   --   --   --   --   LABPROT 17.5*  --   --   --   --   --   --   INR 1.5*  --   --   --   --   --   --   CREATININE 1.32* 1.10  1.10  --   --   --  1.31*  --   TROPONINIHS  --   --  46*  --  45*  --  42*    Estimated Creatinine Clearance: 74.5 mL/min (A) (by C-G formula based on SCr of 1.31 mg/dL (H)).   Medical History: History reviewed. No pertinent past medical history.  Assessment: Stroke with possible apical thrombus. Pharmacy consulted for heparin dosing. Only on Plavix as an anticoagulant PTA  Goal of Therapy:  Heparin level 0.3-0.5 units/ml Monitor platelets by anticoagulation protocol: Yes   Plan:  Start heparin infusion at 1150 units/hr (no bolus) Check anti-Xa level in 6 hours and daily while on heparin Continue to monitor H&H and platelets  12/20/20, PharmD, [facility] Clinical Pharmacist Please see AMION for all Pharmacists' Contact Phone Numbers 12/18/2020, 3:20 PM

## 2020-12-18 NOTE — Progress Notes (Signed)
Referring Physician(s): Code stroke- Benjiman Core (EDP)  Supervising Physician: Julieanne Cotton  Patient Status:  [facility] - In-pt  Chief Complaint: None- intubated with sedation  Subjective:  History of acute CVA s/p diagnostic cerebral arteriogram via right femoral approach 12/17/2020 by Dr. Corliss Skains (revealing severe proximal right ICA stenosis and mid to distal right superior cerebellar artery occlusion); no LVO was identified and procedure was aborted. Patient laying in bed intubated with sedation. He does not open eyes to painful stimuli and does not follow simple commands.  Per RN can spontaneously move left side and RUE with sedation off. Right femoral puncture site c/d/i.  MRI/MRA brain/head 12/17/2020: 1. 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. No associated mass effect. 2. Chronic left PICA distribution infarct. Abnormal flow void within the left V4 segment consistent with slow flow as seen on corresponding MRA. 3. Underlying mild-to-moderate chronic microvascular ischemic disease. 4. Acute occlusion of the right superior cerebellar artery. 5. Attenuated but patent flow within the left V4 segment, suggesting a proximal stenosis. 6. Otherwise wide patency of the major intracranial arterial circulation. No other hemodynamically significant or correctable stenosis.   Allergies: Patient has no allergy information on record.  Medications: Prior to Admission medications   Not on File     Vital Signs: BP (!) 136/96   Pulse 92   Temp 98.42 F (36.9 C)   Resp 20   Ht 6\' 1"  (1.854 m)   Wt 209 lb 10.5 oz (95.1 kg)   SpO2 100%   BMI 27.66 kg/m   Physical Exam Vitals and nursing note reviewed.  Constitutional:      General: He is not in acute distress.    Comments: Intubated with sedation.  Pulmonary:     Effort: Pulmonary effort is normal. No respiratory distress.     Comments: Intubated with sedation. Skin:     General: Skin is warm and dry.     Comments: Right femoral puncture site soft without active bleeding or hematoma.  Neurological:     Comments: Intubated with sedation. He does not open eyes to painful stimuli and does not follow simple commands.  PERRL bilaterally. No spontaneous movements of extremities on exam with sedation, however per RN can spontaneously move left side and RUE with sedation off. Distal pulses (DPs) palpable bilaterally with Doppler.     Imaging: CT Angio Head W or Wo Contrast  Result Date: 12/17/2020 CLINICAL DATA:  Neuro deficit, acute, stroke suspected EXAM: CT ANGIOGRAPHY HEAD AND NECK TECHNIQUE: Multidetector CT imaging of the head and neck was performed using the standard protocol during bolus administration of intravenous contrast. Multiplanar CT image reconstructions and MIPs were obtained to evaluate the vascular anatomy. Carotid stenosis measurements (when applicable) are obtained utilizing NASCET criteria, using the distal internal carotid diameter as the denominator. CONTRAST:  12/19/2020 OMNIPAQUE IOHEXOL 350 MG/ML SOLN COMPARISON:  12/17/2020. FINDINGS: Please note evaluation is limited secondary to suboptimal contrast bolus timing. CTA NECK FINDINGS Aortic arch: Standard branching. Aortic arch atheromatous disease. Imaged portion shows no evidence of aneurysm or dissection. No significant stenosis of the major arch vessel origins. Right carotid system: Patent. Carotid bifurcation atheromatous disease with 50% narrowing of the carotid bulb. Left carotid system: Patent. Carotid bifurcation atheromatous disease with 30% narrowing of the carotid bulb. Vertebral arteries: Dominant right vertebral artery. The right vertebral artery is patent. Likely retrograde filling of the distal left V4 segment. There is non opacification of the left vertebral artery proximally. Skeleton: No acute  finding.  Multilevel spondylosis. Other neck: No adenopathy.  No soft tissue mass. Upper  chest: Please see concurrent CTA chest for findings below the thoracic inlet. Review of the MIP images confirms the above findings CTA HEAD FINDINGS Anterior circulation: Patent ICAs. Bilateral carotid siphon atherosclerotic calcifications without significant narrowing. Patent MCAs. Right A1 segment hypoplasia. Patent ACAs. Patent basilar and proximal superior cerebellar arteries. Patent posterior cerebral arteries. Mild narrowing of the left P2 segment. Posterior circulation: Patent right vertebral artery. Retrograde filling of the distal left V4 segment. Venous sinuses: As permitted by contrast timing, patent. Anatomic variants: Please see above. Review of the MIP images confirms the above findings IMPRESSION: Non opacification of the left vertebral artery with retrograde filling of the distal left V4 segment. Bilateral carotid bifurcation atheromatous disease with 50% right and 30% left carotid bulb narrowing. Mild left P2 segment narrowing. Sub optimal contrast bolus timing limits evaluation. Code stroke imaging results were communicated on 12/17/2020 at 2:17 pm to provider Dr. Pearlean Brownie via secure text paging. Electronically Signed   By: Stana Bunting M.D.   On: 12/17/2020 14:18   CT Angio Neck W and/or Wo Contrast  Result Date: 12/17/2020 CLINICAL DATA:  Neuro deficit, acute, stroke suspected EXAM: CT ANGIOGRAPHY HEAD AND NECK TECHNIQUE: Multidetector CT imaging of the head and neck was performed using the standard protocol during bolus administration of intravenous contrast. Multiplanar CT image reconstructions and MIPs were obtained to evaluate the vascular anatomy. Carotid stenosis measurements (when applicable) are obtained utilizing NASCET criteria, using the distal internal carotid diameter as the denominator. CONTRAST:  OMNIPAQUE IOHEXOL 350 MG/ML SOLN COMPARISON:  12/17/2020. FINDINGS: Please note evaluation is limited secondary to suboptimal contrast bolus timing. CTA NECK FINDINGS Aortic  arch: Standard branching. Aortic arch atheromatous disease. Imaged portion shows no evidence of aneurysm or dissection. No significant stenosis of the major arch vessel origins. Right carotid system: Patent. Carotid bifurcation atheromatous disease with 50% narrowing of the carotid bulb. Left carotid system: Patent. Carotid bifurcation atheromatous disease with 30% narrowing of the carotid bulb. Vertebral arteries: Dominant right vertebral artery. The right vertebral artery is patent. Likely retrograde filling of the distal left V4 segment. There is non opacification of the left vertebral artery proximally. Skeleton: No acute finding.  Multilevel spondylosis. Other neck: No adenopathy.  No soft tissue mass. Upper chest: Please see concurrent CTA chest for findings below the thoracic inlet. Review of the MIP images confirms the above findings CTA HEAD FINDINGS Anterior circulation: Patent ICAs. Bilateral carotid siphon atherosclerotic calcifications without significant narrowing. Patent MCAs. Right A1 segment hypoplasia. Patent ACAs. Patent basilar and proximal superior cerebellar arteries. Patent posterior cerebral arteries. Mild narrowing of the left P2 segment. Posterior circulation: Patent right vertebral artery. Retrograde filling of the distal left V4 segment. Venous sinuses: As permitted by contrast timing, patent. Anatomic variants: Please see above. Review of the MIP images confirms the above findings IMPRESSION: Non opacification of the left vertebral artery with retrograde filling of the distal left V4 segment. Bilateral carotid bifurcation atheromatous disease with 50% right and 30% left carotid bulb narrowing. Mild left P2 segment narrowing. Sub optimal contrast bolus timing limits evaluation. Code stroke imaging results were communicated on 12/17/2020 at 2:17 pm to provider Dr. Pearlean Brownie via secure text paging. Electronically Signed   By: Stana Bunting M.D.   On: 12/17/2020 14:18   MR ANGIO HEAD WO  CONTRAST  Result Date: 12/17/2020 CLINICAL DATA:  Follow-up examination for acute stroke, status post catheter directed arteriogram. EXAM:  MRI HEAD WITHOUT CONTRAST MRA HEAD WITHOUT CONTRAST TECHNIQUE: Multiplanar, multiecho pulse sequences of the brain and surrounding structures were obtained without intravenous contrast. Angiographic images of the head were obtained using MRA technique without contrast. COMPARISON:  Prior CTs and arteriogram from earlier same day. FINDINGS: MRI HEAD FINDINGS Brain: Examination mildly degraded by motion artifact. Cerebral volume within normal limits. Patchy and confluent T2/FLAIR hyperintensity within the periventricular and deep white matter both cerebral hemispheres most consistent with chronic small vessel ischemic disease, mild to moderate in nature. Encephalomalacia and gliosis involving the mid and inferior left cerebellum without residual diffusion abnormality, consistent with a chronic ischemic infarct, left PICA distribution. Area of confluent restricted diffusion measuring approximately 4.1 x 3.1 cm seen involving the superior right cerebellum, consistent with an acute ischemic infarct, right superior cerebellar artery territory (series 2, image 13). No associated hemorrhage or significant regional mass effect. No involvement of the adjacent brainstem. No other diffusion abnormality to suggest acute or subacute ischemia elsewhere within the brain. Gray-white matter differentiation otherwise maintained. No other areas of remote cortical infarction. No evidence for acute intracranial hemorrhage. Single punctate focus of susceptibility artifact noted at the superior left frontal lobe (series 7, image 75), likely a small chronic microhemorrhage, of doubtful significance in isolation. No mass lesion, midline shift or mass effect. Ventricles normal size without hydrocephalus. No extra-axial fluid collection. Pituitary gland and suprasellar region within normal limits. Midline  structures intact. Vascular: Abnormal flow void within the left V4 segment, which could be related to slow flow and/or occlusion. Major intracranial vascular flow voids are otherwise maintained. Skull and upper cervical spine: Craniocervical junction within normal limits. Upper cervical spine normal. Bone marrow signal intensity within normal limits. No scalp soft tissue abnormality. Sinuses/Orbits: Globes and orbital soft tissues within normal limits. Scattered mucosal thickening noted throughout the paranasal sinuses. Layering fluid noted within the nasopharynx. Orogastric tube partially visualized. Mastoid air cells remain clear. Inner ear structures grossly normal. Other: None. MRA HEAD FINDINGS ANTERIOR CIRCULATION: Visualized distal cervical segments of the internal carotid arteries are patent with antegrade flow. Petrous, cavernous, and supraclinoid segments widely patent without stenosis or other abnormality. A1 segments patent bilaterally. Right A1 hypoplastic, accounting for the diminutive right ICA seen prior to the left. Normal anterior communicating artery complex. Both anterior cerebral arteries widely patent to their distal aspects without stenosis. No M1 stenosis or occlusion. Normal left MCA bifurcation. Right MCA trifurcates. Distal MCA branches well perfused and symmetric bilaterally. POSTERIOR CIRCULATION: Dominant right V4 segment patent to the vertebrobasilar junction without stenosis. Right PICA origin patent and normal. Attenuated but patent flow within the left V4 segment, which could reflect a proximal stenosis. Left PICA origin patent and normal. Short fenestration involving the proximal basilar artery noted. Basilar patent to its distal aspect without stenosis. Dominant right AICA. Left superior cerebellar artery patent. Right superior cerebellar artery is occluded, presumably acute in nature given the acute right ACA territory infarct. Both PCAs primarily supplied via the basilar. Small  bilateral posterior communicating arteries noted. Visualized portions of the PCAs are patent to their distal aspects without stenosis. No aneurysm. IMPRESSION: MRI HEAD IMPRESSION: 1. 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. No associated mass effect. 2. Chronic left PICA distribution infarct. Abnormal flow void within the left V4 segment consistent with slow flow as seen on corresponding MRA. 3. Underlying mild-to-moderate chronic microvascular ischemic disease. MRA HEAD IMPRESSION: 1. Acute occlusion of the right superior cerebellar artery. 2. Attenuated but patent flow within  the left V4 segment, suggesting a proximal stenosis. 3. Otherwise wide patency of the major intracranial arterial circulation. No other hemodynamically significant or correctable stenosis. Electronically Signed   By: Rise MuBenjamin  McClintock M.D.   On: 12/17/2020 22:43   MR BRAIN WO CONTRAST  Result Date: 12/17/2020 CLINICAL DATA:  Follow-up examination for acute stroke, status post catheter directed arteriogram. EXAM: MRI HEAD WITHOUT CONTRAST MRA HEAD WITHOUT CONTRAST TECHNIQUE: Multiplanar, multiecho pulse sequences of the brain and surrounding structures were obtained without intravenous contrast. Angiographic images of the head were obtained using MRA technique without contrast. COMPARISON:  Prior CTs and arteriogram from earlier same day. FINDINGS: MRI HEAD FINDINGS Brain: Examination mildly degraded by motion artifact. Cerebral volume within normal limits. Patchy and confluent T2/FLAIR hyperintensity within the periventricular and deep white matter both cerebral hemispheres most consistent with chronic small vessel ischemic disease, mild to moderate in nature. Encephalomalacia and gliosis involving the mid and inferior left cerebellum without residual diffusion abnormality, consistent with a chronic ischemic infarct, left PICA distribution. Area of confluent restricted diffusion measuring  approximately 4.1 x 3.1 cm seen involving the superior right cerebellum, consistent with an acute ischemic infarct, right superior cerebellar artery territory (series 2, image 13). No associated hemorrhage or significant regional mass effect. No involvement of the adjacent brainstem. No other diffusion abnormality to suggest acute or subacute ischemia elsewhere within the brain. Gray-white matter differentiation otherwise maintained. No other areas of remote cortical infarction. No evidence for acute intracranial hemorrhage. Single punctate focus of susceptibility artifact noted at the superior left frontal lobe (series 7, image 75), likely a small chronic microhemorrhage, of doubtful significance in isolation. No mass lesion, midline shift or mass effect. Ventricles normal size without hydrocephalus. No extra-axial fluid collection. Pituitary gland and suprasellar region within normal limits. Midline structures intact. Vascular: Abnormal flow void within the left V4 segment, which could be related to slow flow and/or occlusion. Major intracranial vascular flow voids are otherwise maintained. Skull and upper cervical spine: Craniocervical junction within normal limits. Upper cervical spine normal. Bone marrow signal intensity within normal limits. No scalp soft tissue abnormality. Sinuses/Orbits: Globes and orbital soft tissues within normal limits. Scattered mucosal thickening noted throughout the paranasal sinuses. Layering fluid noted within the nasopharynx. Orogastric tube partially visualized. Mastoid air cells remain clear. Inner ear structures grossly normal. Other: None. MRA HEAD FINDINGS ANTERIOR CIRCULATION: Visualized distal cervical segments of the internal carotid arteries are patent with antegrade flow. Petrous, cavernous, and supraclinoid segments widely patent without stenosis or other abnormality. A1 segments patent bilaterally. Right A1 hypoplastic, accounting for the diminutive right ICA seen  prior to the left. Normal anterior communicating artery complex. Both anterior cerebral arteries widely patent to their distal aspects without stenosis. No M1 stenosis or occlusion. Normal left MCA bifurcation. Right MCA trifurcates. Distal MCA branches well perfused and symmetric bilaterally. POSTERIOR CIRCULATION: Dominant right V4 segment patent to the vertebrobasilar junction without stenosis. Right PICA origin patent and normal. Attenuated but patent flow within the left V4 segment, which could reflect a proximal stenosis. Left PICA origin patent and normal. Short fenestration involving the proximal basilar artery noted. Basilar patent to its distal aspect without stenosis. Dominant right AICA. Left superior cerebellar artery patent. Right superior cerebellar artery is occluded, presumably acute in nature given the acute right ACA territory infarct. Both PCAs primarily supplied via the basilar. Small bilateral posterior communicating arteries noted. Visualized portions of the PCAs are patent to their distal aspects without stenosis. No aneurysm. IMPRESSION: MRI HEAD  IMPRESSION: 1. 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. No associated mass effect. 2. Chronic left PICA distribution infarct. Abnormal flow void within the left V4 segment consistent with slow flow as seen on corresponding MRA. 3. Underlying mild-to-moderate chronic microvascular ischemic disease. MRA HEAD IMPRESSION: 1. Acute occlusion of the right superior cerebellar artery. 2. Attenuated but patent flow within the left V4 segment, suggesting a proximal stenosis. 3. Otherwise wide patency of the major intracranial arterial circulation. No other hemodynamically significant or correctable stenosis. Electronically Signed   By: Rise Mu M.D.   On: 12/17/2020 22:43   DG Abd Portable 1V  Result Date: 12/17/2020 CLINICAL DATA:  OG tube placement EXAM: PORTABLE ABDOMEN - 1 VIEW COMPARISON:  None.  FINDINGS: Nonobstructed gas pattern. Excreted contrast within the renal collecting systems. Esophageal tube tip and side port overlie the proximal stomach. IMPRESSION: Esophageal tube tip overlies the proximal stomach. Electronically Signed   By: Jasmine Pang M.D.   On: 12/17/2020 17:59   EEG adult  Result Date: 12/17/2020 Charlsie Quest, MD     12/17/2020  5:07 PM Patient Name: [name] MRN: [id number] Epilepsy Attending: Charlsie Quest Referring Physician/Provider: Jimmye Norman, NP Date: 12/17/2020 Duration: 24.25 mins Patient history: unknown age male with ams, left gaze preference. EEG to evaluate for seizure. Level of alertness: comatose AEDs during EEG study: Propofol Technical aspects: This EEG study was done with scalp electrodes positioned according to the 10-20 International system of electrode placement. Electrical activity was acquired at a sampling rate of 500Hz  and reviewed with a high frequency filter of 70Hz  and a low frequency filter of 1Hz . EEG data were recorded continuously and digitally stored. Description: EEG showed continuous generalized 8-10Hz  alpha activity. EEG was not reactive to tactile stimulation. Hyperventilation and photic stimulation were not performed.   ABNORMALITY - Alpha coma IMPRESSION: This study is suggestive of severe diffuse encephalopathy, nonspecific etiology but could be secondary to sedation, toxic-metabolic etiology, anoxic/hypoxic brain injury. No seizures or epileptiform discharges were seen throughout the recording. Dr Pearlean Brownie was notified Charlsie Quest   CT HEAD CODE STROKE WO CONTRAST  Result Date: 12/17/2020 CLINICAL DATA:  Code stroke.  Neuro deficit, acute, stroke suspected EXAM: CT HEAD WITHOUT CONTRAST TECHNIQUE: Contiguous axial images were obtained from the base of the skull through the vertex without intravenous contrast. COMPARISON:  None. FINDINGS: Brain: No intracranial hemorrhage. Focal left cerebellar hypodensity. No mass  lesion. No midline shift, ventriculomegaly or extra-axial fluid collection. Vascular: No hyperdense vessel or unexpected calcification. Skull: Negative for fracture or focal lesion. Sinuses/Orbits: Normal orbits. Chronic defect of the medial left orbital wall. Clear paranasal sinuses. No mastoid effusion. Other: Small right parietal scalp hematoma. ASPECTS [facility] Stroke Program Early CT Score) - Ganglionic level infarction (caudate, lentiform nuclei, internal capsule, insula, M1-M3 cortex): 7 - Supraganglionic infarction (M4-M6 cortex): 3 Total score (0-10 with 10 being normal): 10 IMPRESSION: 1. Left cerebellar hypodensity concerning for acute/subacute infarct. 2. No intracranial hemorrhage. 3. ASPECTS is 10 4. Small right parietal scalp hematoma. 5. Code stroke imaging results were communicated on 12/17/2020 at 1:32 pm to provider Dr. Pearlean Brownie via secure text paging. Electronically Signed   By: Stana Bunting M.D.   On: 12/17/2020 13:35    Labs:  CBC: Recent Labs    12/17/20 1310 12/17/20 1312 12/17/20 1624 12/18/20 0336  WBC 9.8  --   --  11.8*  HGB 15.0 16.3 13.6 13.6  HCT 45.4 48.0 40.0 42.0  PLT 396  --   --  317    COAGS: Recent Labs    12/17/20 1310  INR 1.5*  APTT 27    BMP: Recent Labs    12/17/20 1310 12/17/20 1312 12/17/20 1624 12/18/20 0336  NA 138 137 137 137  K 4.3 4.3 3.8 4.6  CL 102 105  --  104  CO2 22  --   --  22  GLUCOSE 108* 105*  --  83  BUN 17 21  --  16  CALCIUM 8.6*  --   --  8.0*  CREATININE 1.32* 1.10  --  1.31*  GFRNONAA 36*  --   --  37*    LIVER FUNCTION TESTS: Recent Labs    12/17/20 1310 12/18/20 0336  BILITOT 1.7* 1.1  AST 282* 174*  ALT 376* 284*  ALKPHOS 165* 129*  PROT 6.9 5.6*  ALBUMIN 2.8* 2.3*    Assessment and Plan:  History of acute CVA s/p diagnostic cerebral arteriogram via right femoral approach 12/17/2020 by Dr. Corliss Skains (revealing severe proximal right ICA stenosis and mid to distal right superior cerebellar  artery occlusion); no LVO was identified and procedure was aborted. Patient's condition stable- remains intubated with sedation, does not open eyes to painful stimuli and does not follow simple commands, per RN can spontaneously move left side and RUE with sedation off. Right femoral puncture site stable, distal pulses (DPs) palpable bilaterally with Doppler. Further plans per neurology- appreciate and agree with management. Please call NIR with questions/concerns.   Electronically Signed: Elwin Mocha, PA-C 12/18/2020, 9:11 AM   I spent a total of 25 Minutes at the the patient's bedside AND on the patient's hospital floor or unit, greater than 50% of which was counseling/coordinating care for CVA s/p diagnostic cerebral arteriogram.

## 2020-12-18 NOTE — Progress Notes (Signed)
ANTICOAGULATION CONSULT NOTE   Pharmacy Consult for Heparin Indication: stroke  Allergies  Allergen Reactions   Ibuprofen Anaphylaxis   Latex Other (See Comments)    Reaction??   Peanut-Containing Drug Products Other (See Comments)    Allergic to peanut butter (reaction??)   Tylenol [Acetaminophen] Hives and Nausea And Vomiting    Patient Measurements: Height: 6\' 1"  (185.4 cm) Weight: 95.1 kg (209 lb 10.5 oz) IBW/kg (Calculated) : 79.9   Vital Signs: Temp: 98.1 F (36.7 C) (01/18 2000) Temp Source: Axillary (01/18 2000) BP: 120/90 (01/18 2200) Pulse Rate: 90 (01/18 2200)  Labs: Recent Labs    12/17/20 1310 12/17/20 1312 12/17/20 1558 12/17/20 1624 12/17/20 1800 12/18/20 0336 12/18/20 1034 12/18/20 2238  HGB 15.0 16.3   16.3  --  13.6  --  13.6  --   --   HCT 45.4 48.0   48.0  --  40.0  --  42.0  --   --   PLT 396  --   --   --   --  317  --   --   APTT 27  --   --   --   --   --   --   --   LABPROT 17.5*  --   --   --   --   --   --   --   INR 1.5*  --   --   --   --   --   --   --   HEPARINUNFRC  --   --   --   --   --   --   --  0.20*  CREATININE 1.32* 1.10   1.10  --   --   --  1.31*  --   --   TROPONINIHS  --   --  46*  --  45*  --  42*  --     Estimated Creatinine Clearance: 74.5 mL/min (A) (by C-G formula based on SCr of 1.31 mg/dL (H)).   Medical History: History reviewed. No pertinent past medical history.  Assessment: Stroke with possible apical thrombus. Pharmacy consulted for heparin dosing. Only on Plavix as an anticoagulant PTA  1/18 PM update:  Heparin level below goal No issues per RN  Goal of Therapy:  Heparin level 0.3-0.5 units/ml Monitor platelets by anticoagulation protocol: Yes   Plan:  Inc heparin infusion to 1250 units/hr (no boluses) Check anti-Xa level in 8 hours and daily while on heparin Continue to monitor H&H and platelets  2/18, PharmD, BCPS Clinical Pharmacist Phone: [phone]

## 2020-12-18 NOTE — Progress Notes (Signed)
Initial Nutrition Assessment  DOCUMENTATION CODES:   Not applicable  INTERVENTION:   Initiate tube feeding via OG tube: Vital AF 1.2 at 50 ml/h (1200 ml per day) Prosource TF 45 ml TID  Provides 1560 kcal, 123 gm protein, 973 ml free water daily  TF regimen and propofol at current rate providing 2299 total kcal/day    NUTRITION DIAGNOSIS:   Inadequate oral intake related to inability to eat as evidenced by NPO status.  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   Patient is currently intubated on ventilator support MV: 11.1 L/min Temp (24hrs), Avg:97.6 F (36.4 C), Min:93.74 F (34.3 C), Max:99.32 F (37.4 C)  Propofol: 28 ml/hr provides: 739 kcal  Medications reviewed and include: colace, miralax Labs reviewed: CBG's: 30-73   OG with tip in the proximal stomach   Diet Order:   Diet Order            Diet NPO time specified  Diet effective now                 EDUCATION NEEDS:   No education needs have been identified at this time  Skin:  Skin Assessment: Reviewed RN Assessment (R/L great toe wounds)  Last BM:  unknown  Height:   Ht Readings from Last 1 Encounters:  12/17/20 6\' 1"  (1.854 m)    Weight:   Wt Readings from Last 1 Encounters:  12/17/20 95.1 kg    Ideal Body Weight:     BMI:  Body mass index is 27.66 kg/m.  Estimated Nutritional Needs:   Kcal:  2200  Protein:  120-145 grams  Fluid:  >2 L/day  12/19/20., RD, LDN, CNSC See AMiON for contact information

## 2020-12-18 NOTE — Consult Note (Signed)
WOC Nurse Consult Note: Patient receiving care in [facility] 4N17. Primary RN present at time of my assessment. Reason for Consult: bilateral great toe wounds Wound type: The patient was living in a homeless shelter prior to his arrival to [facility]. These toe wounds may represent trauma from living conditions and/or poorly fitting shoes, if any. Pressure Injury POA: Yes/No/NA Measurement: Right great toe, plantar surface has a full thickness wound surrounded by callus. The wound measures 4 cm x 4 cm x approximately 0.4 cm. Some pink in the wound bed, surrounded by yellowish/brown tissue; heavy tan appearing exudate. The left great toe has a huge medial callus.  There is also a fissure along the junction of the toe and foot that measures 1 cm x 3.4 cm.  It is red and clean. Wound bed: Drainage (amount, consistency, odor)  Periwound: Dressing procedure/placement/frequency: Wash bilateral great toe wounds with soap and water. Apply Silvadene. Cover with gauze and a few turns of kerlex. To be performed bid. Monitor the wound area(s) for worsening of condition such as: Signs/symptoms of infection,  Increase in size,  Development of or worsening of odor, Development of pain, or increased pain at the affected locations.  Notify the medical team if any of these develop.  Thank you for the consult.  Discussed plan of care with the bedside nurse.  WOC nurse will not follow at this time.  Please re-consult the WOC team if needed.  Helmut Muster, RN, MSN, CWOCN, CNS-BC, pager [phone]

## 2020-12-18 NOTE — Progress Notes (Signed)
NAMELedora [name], MRN:  [id number], DOB:  12/01/1873, LOS: 1 ADMISSION DATE:  12/17/2020, CONSULTATION DATE: 12/17/2020 REFERRING MD: Dr. Pearlean Brownie, CHIEF COMPLAINT: Unresponsiveness  Brief History:  Middle-aged male with unknown identity was brought in to the emergency department after he complained of chest pain and became unresponsive with left-sided weakness and left gaze deviation. Underwent angiogram but no intervention, remained intubated  History of Present Illness:  Middle-aged African-American male with unknown identity and past medical history was [place] emergency [place]  Per bystander and patient complained of chest pain and all of a sudden he became unresponsive, in the ED was noted to have left sided flaccid paresis with forced left gaze deviation.  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  Past Medical History:  Unknown  Significant Hospital Events:  ETT 1/17  Consults:  Neurology PCCM  Procedures:  1/17 cerebral angiogram  Significant Diagnostic Tests:  1/17 CT head: Left cerebellar hypodensity concerning for acute/subacute infarct. No intracranial hemorrhage. Small right parietal scalp hematoma  1/17 CTA head and neck: Non opacification of the left vertebral artery with retrograde filling of the distal left V4 segment. Bilateral carotid bifurcation atheromatous disease with 50% right and 30% left carotid bulb narrowing. Mild left P2 segment narrowing.  Micro Data:  1/17 influenza/COVID PCR negative  Antimicrobials:  Cefazolin 1/17  Interim History / Subjective:  No acute events overnight   Seen on SBT trial this AM  Objective   Blood pressure (!) 133/94, pulse 94, temperature 98.78 F (37.1 C), resp. rate 20, height 6\' 1"  (1.854 m), weight 95.1 kg, SpO2 100 %.    Vent Mode: PRVC FiO2 (%):  [40 %-50 %] 40 % Set Rate:  [14 bmp-20 bmp] 20 bmp Vt Set:  [510 mL-630 mL] 510 mL PEEP:  [5 cmH20] 5 cmH20 Plateau Pressure:  [15 cmH20-18 cmH20] 15 cmH20   Intake/Output Summary (Last 24 hours) at 12/18/2020 12/20/2020 Last data filed at 12/18/2020 0600 Gross per 24 hour  Intake 1590.84 ml  Output 325 ml  Net 1265.84 ml   Filed Weights   12/17/20 1300  Weight: 95.1 kg    Examination: General: Chronically ill appearing middle aged male lying in bed on mechanical ventilation, in NAD HEENT: ETT, MM pink/moist, PERRL,  Neuro: Sedated on prop drip CV: s1s2 regular rate and rhythm, no murmur, rubs, or gallops,  PULM:  Clear to ascultation bilaterally, no added breath sounds, tolerating SBT currently  GI: soft, bowel sounds active in all 4 quadrants, non-tender, non-distended Extremities: warm/dry, no edema  Skin: no rashes or lesions  Resolved Hospital Problem list     Assessment & Plan:  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 -Patient presented with flaccid left-sided weakness and left gaze deviation -CT head confirmed acute/subacute left cerebellar stroke -Patient underwent cerebral angiogram but no intervention was done due to no LVO P: Management per neurology  Maintain neuro protective measures; goal for eurothermia, euglycemia, eunatermia, normoxia, and PCO2 goal of 35-40 Nutrition and bowel regiment  Seizure precautions  AEDs per neurology  Aspirations precautions  Continue ASA and statin  Continue secondary stroke prophylaxis A1c pending  Questionable seizure activity -  Patient had left-sided jerking movements in the ED, EEG was done which shows diffuse encephalopathy, no active seizures were noted on EEG P: Seizure  precautions  Ativan as needed for seizure activity  Acute hypoxic respiratory failure -Due to above P: Continue ventilator support with lung protective strategies  Currently tolerating SBT on prop infusion Lighten sedation as able  If mentation allow possible extubation today Wean PEEP and FiO2 for sats greater than 90%. Head of bed elevated 30 degrees. Plateau pressures less than 30 cm H20.  Follow intermittent chest x-ray and ABG.  Ensure adequate pulmonary hygiene  Follow cultures  RASS goal 0 to -1 VAP bundle in place  PAD protocol  Acute encephalopathy metabolic/toxic P: Minimize sedation as able  Supportive care  Delirium precautions   Probable chronic systolic congestive heart failure -Bedside ultrasonography is suggestive of severe systolic dysfunction P: Formal ECHO pending Strict I&O Closely monitor volume status  Acute hepatitis, likely due to low flow state -LFTs elevated on admit P: Trend LFTs, improving  Trend CMP  Hypertension -Unknown PMX P: Permissive hypertension  Closely monitor B/P in ICU setting   Hypothermia/hypoglycemia P: Continue to monitor CBG  CBG goal 140-180  Best practice (evaluated daily)  Diet: NPO Pain/Anxiety/Delirium protocol (if indicated): Propofol VAP protocol (if indicated): Ordered DVT prophylaxis: Subcu Lovenox GI prophylaxis: Protonix Glucose control: SSI Mobility: Bedrest Disposition: ICU  Goals of Care:   Code Status: Full code  Labs   CBC: Recent Labs  Lab 12/17/20 1310 12/17/20 1312 12/17/20 1624 12/18/20 0336  WBC 9.8  --   --  11.8*  NEUTROABS 4.9  --   --  8.9*  HGB 15.0 16.3 13.6 13.6  HCT 45.4 48.0 40.0 42.0  MCV 98.9  --   --  100.0  PLT 396  --   --  317    Basic Metabolic Panel: Recent Labs  Lab 12/17/20 1310 12/17/20 1312 12/17/20 1624 12/18/20 0336  NA 138 137 137 137  K 4.3 4.3 3.8 4.6  CL 102 105  --  104  CO2 22  --   --  22  GLUCOSE 108* 105*  --  83  BUN 17 21  --   16  CREATININE 1.32* 1.10  --  1.31*  CALCIUM 8.6*  --   --  8.0*   GFR: Estimated Creatinine Clearance: -4.2 mL/min (A) (by C-G formula based on SCr of 1.31 mg/dL (H)). Recent Labs  Lab 12/17/20 1310 12/17/20 2155 12/18/20 0336  WBC 9.8  --  11.8*  LATICACIDVEN  --  1.1  --     Liver Function Tests: Recent Labs  Lab 12/17/20 1310 12/18/20 0336  AST 282* 174*  ALT 376* 284*  ALKPHOS 165* 129*  BILITOT 1.7* 1.1  PROT 6.9 5.6*  ALBUMIN 2.8* 2.3*   No results for input(s): LIPASE, AMYLASE in the last 168 hours. No results for input(s): AMMONIA in the last 168 hours.  ABG    Component Value Date/Time   PHART 7.367 12/17/2020 1624   PCO2ART 42.2 12/17/2020 1624   PO2ART 107 12/17/2020 1624   HCO3 24.6 12/17/2020 1624   TCO2 26 12/17/2020 1624   ACIDBASEDEF 1.0 12/17/2020 1624   O2SAT 98.0 12/17/2020 1624     Coagulation Profile: Recent Labs  Lab 12/17/20 1310  INR 1.5*    Cardiac Enzymes: No results for input(s): CKTOTAL, CKMB, CKMBINDEX, TROPONINI in the last 168 hours.  HbA1C: No results found for: HGBA1C  CBG: Recent Labs  Lab  12/17/20 1306 12/17/20 1316 12/17/20 1353  GLUCAP 54* 49* 266*     Total critical care time: 35 minutes  Performed by: Cheri Fowler   Critical care time was exclusive of separately billable procedures and treating other patients.   Critical care was necessary to treat or prevent imminent or life-threatening deterioration.   Critical care was time spent personally by me on the following activities: development of treatment plan with patient and/or surrogate as well as nursing, discussions with consultants, evaluation of patient's response to treatment, examination of patient, obtaining history from patient or surrogate, ordering and performing treatments and interventions, ordering and review of laboratory studies, ordering and review of radiographic studies, pulse oximetry and re-evaluation of patient's condition.    Delfin [name], NP-C [facility] Contact / Pager information can be found on Amion  12/18/2020, 7:26 AM

## 2020-12-18 NOTE — Progress Notes (Signed)
OT Cancellation Note  Patient Details Name: Belva Bertin Doe MRN: [id number] DOB: 12/01/1873   Cancelled Treatment:     RN requesting therapy to hold at this time. OT to check back at later more appropriate time  Wynona Neat, OTR/L  Acute Rehabilitation Services Pager: [phone] Office: [phone] .  12/18/2020, 10:06 AM

## 2020-12-18 NOTE — Progress Notes (Addendum)
  Echocardiogram 2D Echocardiogram has been performed with Definity.  Gerda Diss 12/18/2020, 9:47 AM

## 2020-12-19 ENCOUNTER — Inpatient Hospital Stay (HOSPITAL_COMMUNITY): Payer: Medicaid Other

## 2020-12-19 DIAGNOSIS — I6300 Cerebral infarction due to thrombosis of unspecified precerebral artery: Secondary | ICD-10-CM | POA: Diagnosis not present

## 2020-12-19 DIAGNOSIS — R4182 Altered mental status, unspecified: Secondary | ICD-10-CM | POA: Diagnosis not present

## 2020-12-19 LAB — BASIC METABOLIC PANEL WITH GFR
Anion gap: 9 (ref 5–15)
BUN: 16 mg/dL (ref 6–20)
CO2: 20 mmol/L — ABNORMAL LOW (ref 22–32)
Calcium: 7.4 mg/dL — ABNORMAL LOW (ref 8.9–10.3)
Chloride: 108 mmol/L (ref 98–111)
Creatinine, Ser: 1.16 mg/dL (ref 0.61–1.24)
GFR, Estimated: >60 mL/min
Glucose, Bld: 107 mg/dL — ABNORMAL HIGH (ref 70–99)
Potassium: 4.3 mmol/L (ref 3.5–5.1)
Sodium: 137 mmol/L (ref 135–145)

## 2020-12-19 LAB — MAGNESIUM
Magnesium: 1.8 mg/dL (ref 1.7–2.4)
Magnesium: 1.9 mg/dL (ref 1.7–2.4)

## 2020-12-19 LAB — PHOSPHORUS
Phosphorus: 2.9 mg/dL (ref 2.5–4.6)
Phosphorus: 3.4 mg/dL (ref 2.5–4.6)

## 2020-12-19 LAB — TRIGLYCERIDES: Triglycerides: 52 mg/dL (ref <150)

## 2020-12-19 LAB — CBC
HCT: 36.5 % — ABNORMAL LOW (ref 39.0–52.0)
Hemoglobin: 11.6 g/dL — ABNORMAL LOW (ref 13.0–17.0)
MCH: 32 pg (ref 26.0–34.0)
MCHC: 31.8 g/dL (ref 30.0–36.0)
MCV: 100.8 fL — ABNORMAL HIGH (ref 80.0–100.0)
Platelets: 272 10*3/uL (ref 150–400)
RBC: 3.62 MIL/uL — ABNORMAL LOW (ref 4.22–5.81)
RDW: 16 % — ABNORMAL HIGH (ref 11.5–15.5)
WBC: 7.8 10*3/uL (ref 4.0–10.5)
nRBC: 0.3 % — ABNORMAL HIGH (ref 0.0–0.2)

## 2020-12-19 LAB — GLUCOSE, CAPILLARY
Glucose-Capillary: 94 mg/dL (ref 70–99)
Glucose-Capillary: 111 mg/dL — ABNORMAL HIGH (ref 70–99)
Glucose-Capillary: 88 mg/dL (ref 70–99)
Glucose-Capillary: 103 mg/dL — ABNORMAL HIGH (ref 70–99)
Glucose-Capillary: 121 mg/dL — ABNORMAL HIGH (ref 70–99)
Glucose-Capillary: 139 mg/dL — ABNORMAL HIGH (ref 70–99)

## 2020-12-19 LAB — HEPARIN LEVEL (UNFRACTIONATED)
Heparin Unfractionated: 0.19 IU/mL — ABNORMAL LOW (ref 0.30–0.70)
Heparin Unfractionated: 0.22 IU/mL — ABNORMAL LOW (ref 0.30–0.70)
Heparin Unfractionated: 0.24 IU/mL — ABNORMAL LOW (ref 0.30–0.70)

## 2020-12-19 IMAGING — CT CT HEAD W/O CM
4 series · 16 of 47 positions shown, 18 images · non-contrast
Comparison: MRI head [DATE].  CT head [DATE]

CLINICAL DATA: Stroke.

EXAM:
CT HEAD WITHOUT CONTRAST
TECHNIQUE: Contiguous axial images were obtained from the base of the skull
through the vertex without intravenous contrast.

[Series 3: head wo · axial · 0.45mm/px · z∈[-76,+44]mm · 7 of 34 slices shown, 9 images]
[im 5/34  brain]
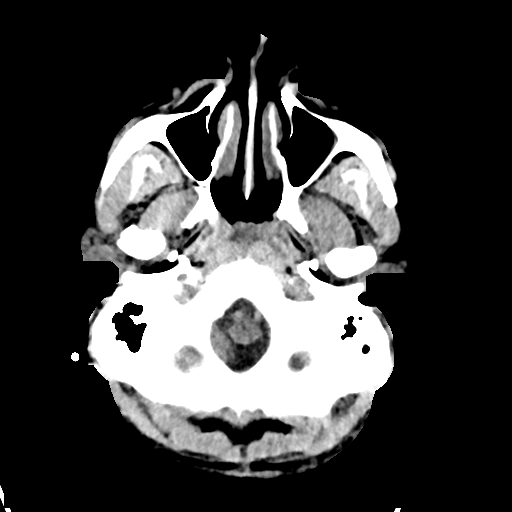
[im 5/34  bone]
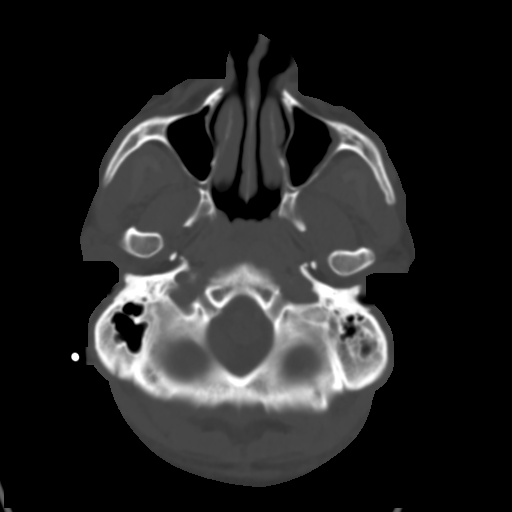
[im 9/34  brain]
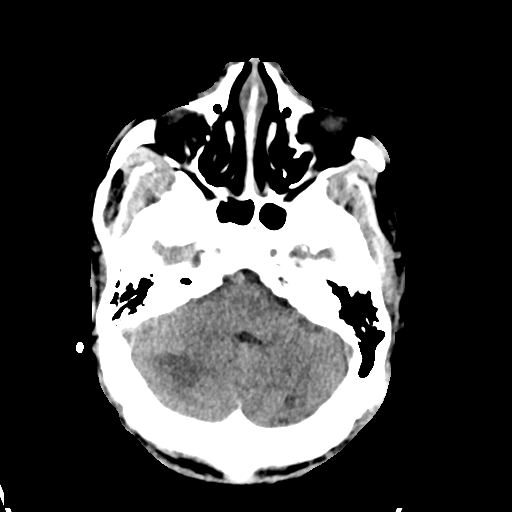
[im 13/34  brain]
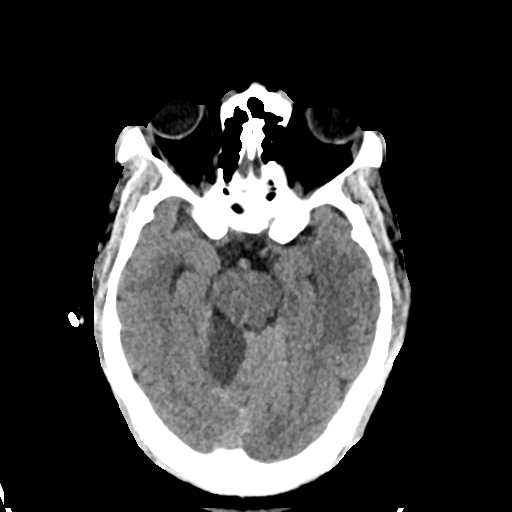
[im 17/34  brain]
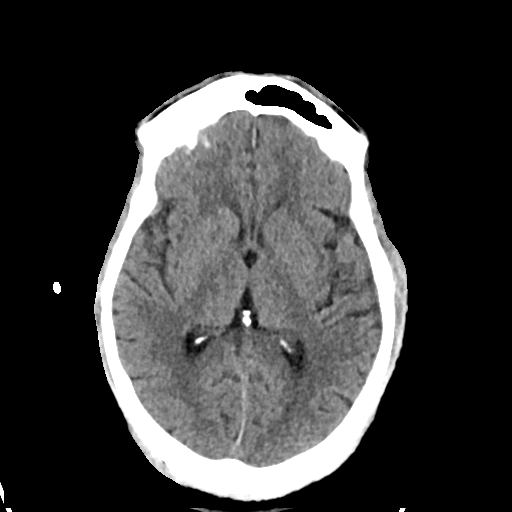
[im 21/34  brain]
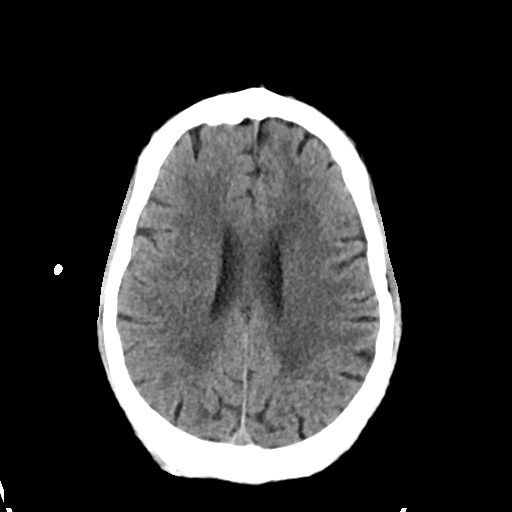
[im 21/34  bone]
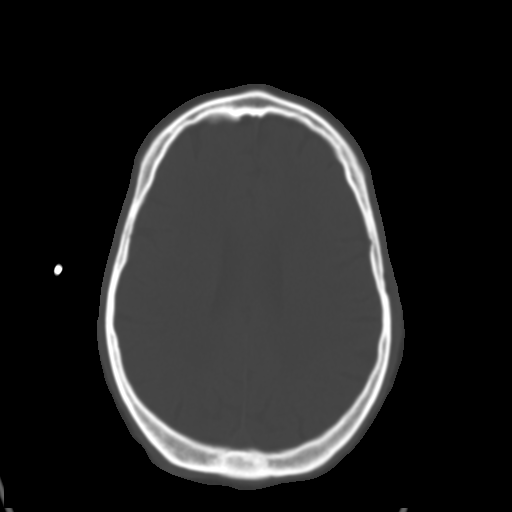
[im 25/34  brain]
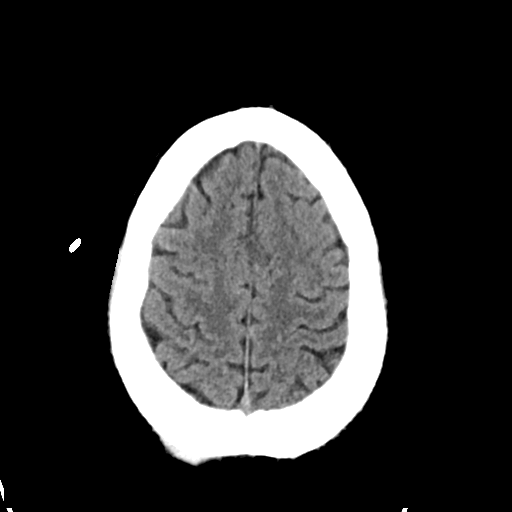
[im 29/34  brain]
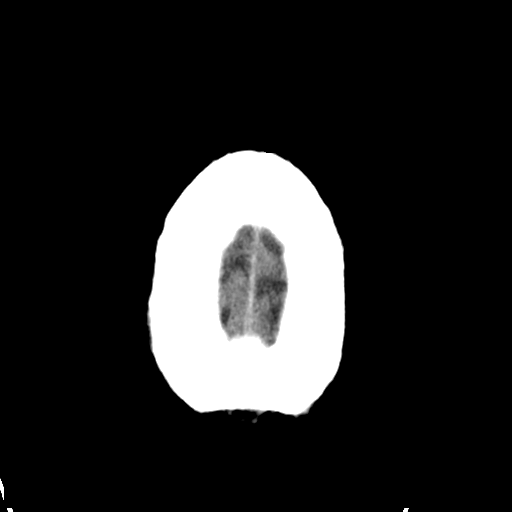

[Series 4: head bone · axial · 0.45mm/px · z∈[-80,-46]mm · 3 of 85 slices shown]
[im 9/85  bone]
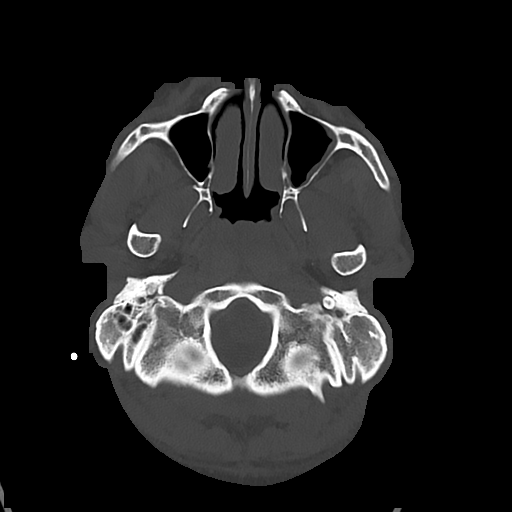
[im 17/85  bone]
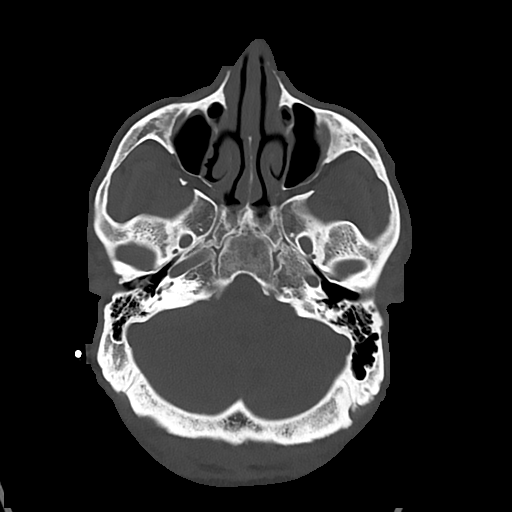
[im 26/85  bone]
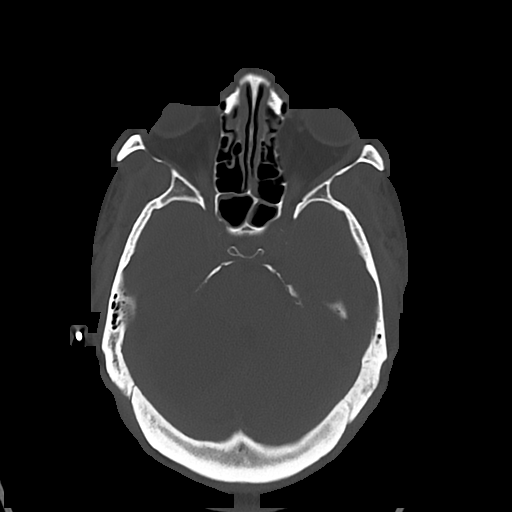

[Series 5: cor soft · coronal · 0.35mm/px · 3 of 91 slices shown]
[im 31/91  brain]
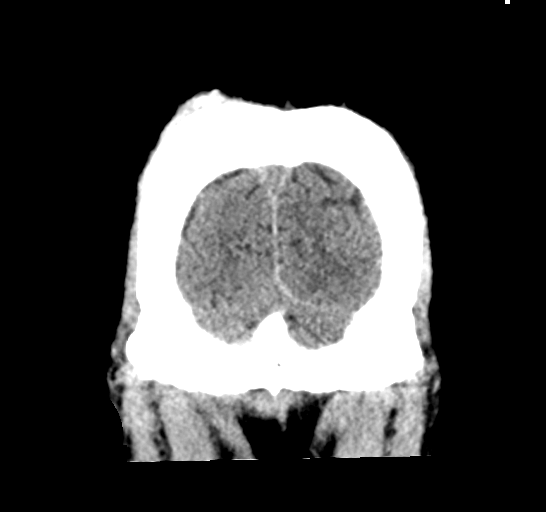
[im 41/91  brain]
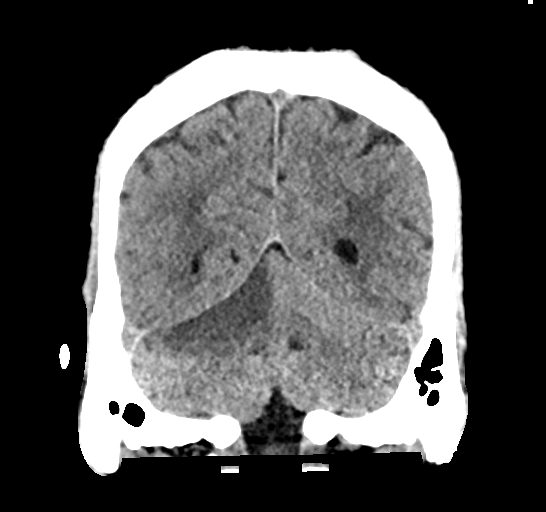
[im 51/91  brain]
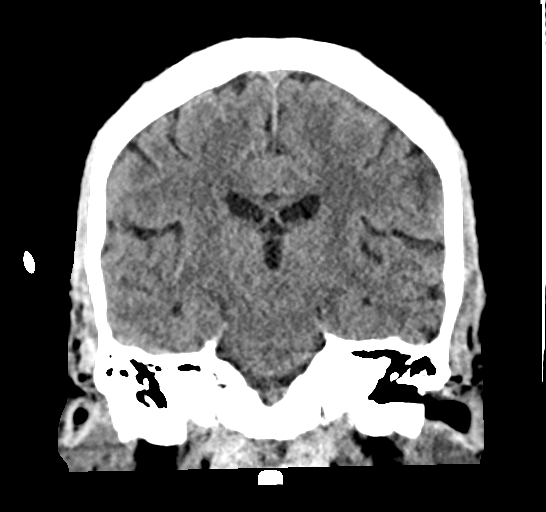

[Series 6: sag soft · sagittal · 0.35mm/px · 3 of 64 slices shown]
[im 22/64  brain]
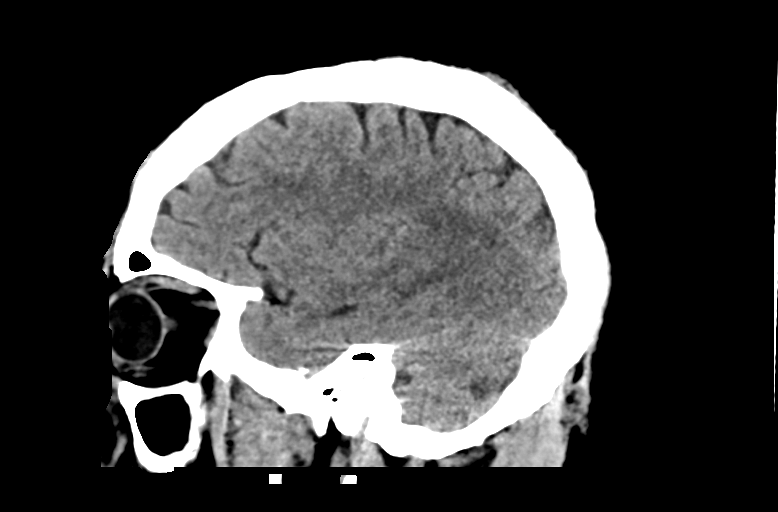
[im 32/64  brain]
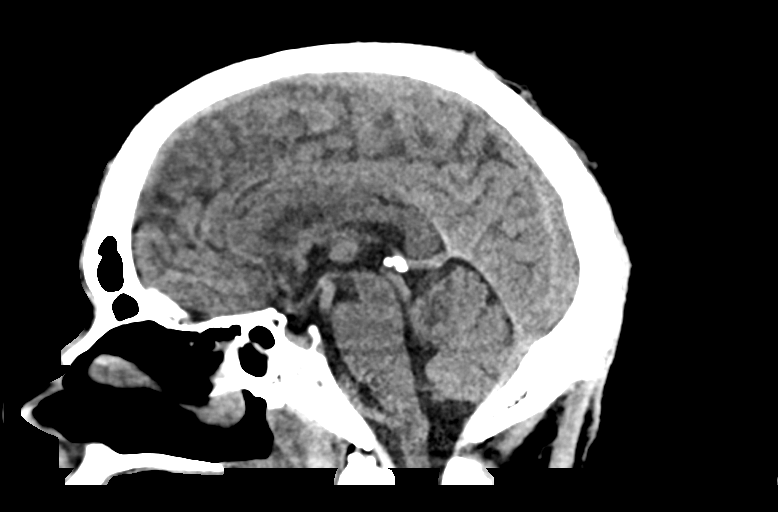
[im 43/64  brain]
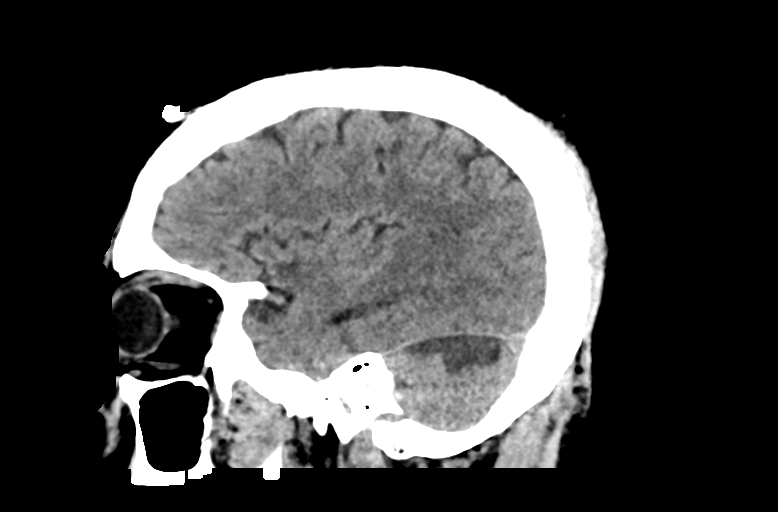

[16 of 47 positions shown; findings below may reference images not displayed]

FINDINGS: Brain: Hypodensity right superior cerebellum consistent with acute
infarct as noted on diffusion-weighted imaging. No hemorrhage.

Chronic hemorrhage left inferior cerebellum unchanged for MRI.
Patchy white matter hypodensity bilaterally unchanged from MRI and
consistent with chronic microvascular ischemia. Ventricle size
normal. No acute hemorrhage.

Vascular: Negative for hyperdense vessel

Skull: Negative

Sinuses/Orbits: Chronic fracture left medial orbit. No orbital
lesion. Mild mucosal edema paranasal sinuses

Other: None
IMPRESSION: Acute infarct right superior cerebellum unchanged from MRI.

No acute hemorrhage.

## 2020-12-19 MED ORDER — FUROSEMIDE 20 MG PO TABS
20.0000 mg | ORAL_TABLET | Freq: Every day | ORAL | Status: DC
  Administered 2020-12-19 – 2020-12-20 (×2): 20 mg
  Filled 2020-12-19 (×2): qty 1

## 2020-12-19 MED ORDER — CARVEDILOL 3.125 MG PO TABS
3.1250 mg | ORAL_TABLET | Freq: Two times a day (BID) | ORAL | Status: DC
  Administered 2020-12-19 (×2): 3.125 mg
  Filled 2020-12-19 (×3): qty 1

## 2020-12-19 MED ORDER — DEXMEDETOMIDINE HCL IN NACL 400 MCG/100ML IV SOLN
0.0000 ug/kg/h | INTRAVENOUS | Status: DC
  Administered 2020-12-19 (×2): 0.4 ug/kg/h via INTRAVENOUS
  Administered 2020-12-20: 0.3 ug/kg/h via INTRAVENOUS
  Filled 2020-12-19 (×3): qty 100

## 2020-12-19 MED ORDER — PANTOPRAZOLE SODIUM 40 MG PO PACK
40.0000 mg | PACK | Freq: Every day | ORAL | Status: DC
  Administered 2020-12-19: 40 mg
  Filled 2020-12-19: qty 20

## 2020-12-19 MED ORDER — ATROPINE SULFATE 1 MG/10ML IJ SOSY
PREFILLED_SYRINGE | INTRAMUSCULAR | Status: AC
  Filled 2020-12-19: qty 10

## 2020-12-19 NOTE — Progress Notes (Signed)
SLP Cancellation Note  Patient Details Name: [name] MRN: [id number] DOB: Apr 19, 1966   Cancelled treatment:        Pt is currently sedated on vent. Will follow along   [name] 12/19/2020, 8:45 AM   [name].Ed Nurse, children[facility] [phone] Office [phone]

## 2020-12-19 NOTE — Progress Notes (Signed)
OT / PT Cancellation Note  Patient Details Name: [name] MRN: [id number] DOB: [birth year]   Cancelled Treatment:    Reason Eval/Treat Not Completed: Medical issues which prohibited therapy PT with bradycardia and agitation with decreased sedation. Pt vent dependent at this time. OT to check back at more appropriate time.   Wynona Neat, OTR/L  Acute Rehabilitation Services Pager: [phone] Office: [phone] .  12/19/2020, 1:20 PM

## 2020-12-19 NOTE — Progress Notes (Signed)
ANTICOAGULATION CONSULT NOTE  Pharmacy Consult for Heparin Indication:  Possible Apical Thrombus  Allergies  Allergen Reactions   Ibuprofen Anaphylaxis   Latex Other (See Comments)    Reaction??   Peanut-Containing Drug Products Other (See Comments)    Allergic to peanut butter (reaction??)   Tylenol [Acetaminophen] Hives and Nausea And Vomiting    Patient Measurements: Height: 6\' 1"  (185.4 cm) Weight: 95 kg (209 lb 7 oz) IBW/kg (Calculated) : 79.9 Heparin dosing weight: 95 kg  Vital Signs: Temp: 98.1 F (36.7 C) (01/19 1200) Temp Source: Axillary (01/19 1200) BP: 102/76 (01/19 1522) Pulse Rate: 65 (01/19 1522)  Labs: Recent Labs    12/17/20 1310 12/17/20 1312 12/17/20 1558 12/17/20 1624 12/17/20 1800 12/18/20 0336 12/18/20 1034 12/18/20 2238 12/19/20 0533 12/19/20 1418  HGB 15.0 16.3   16.3  --  13.6  --  13.6  --   --  11.6*  --   HCT 45.4 48.0   48.0  --  40.0  --  42.0  --   --  36.5*  --   PLT 396  --   --   --   --  317  --   --  272  --   APTT 27  --   --   --   --   --   --   --   --   --   LABPROT 17.5*  --   --   --   --   --   --   --   --   --   INR 1.5*  --   --   --   --   --   --   --   --   --   HEPARINUNFRC  --   --   --   --   --   --   --  0.20* 0.19* 0.22*  CREATININE 1.32* 1.10   1.10  --   --   --  1.31*  --   --  1.16  --   TROPONINIHS  --   --  46*  --  45*  --  42*  --   --   --     Estimated Creatinine Clearance: 84.2 mL/min (by C-G formula based on SCr of 1.16 mg/dL).   Assessment: 55 yr old man with right superior cerebellar artery territory infarct continuing on IV heparin for possible apical thrombus (no boluses, due to CVA).    Heparin level ~6 hrs after heparin infusion was increased to 1450 units/hr was 0.22 units/ml, which is below the goal range for this pt. H/H 11.6/36.5, platelets 272. Per RN, no issues with IV or bleeding observed.  Goal of Therapy:  Heparin level 0.3-0.5 units/ml Monitor platelets by  anticoagulation protocol: Yes   Plan:  Increase heparin infusion to 1550 units/hr - no bolus with acute CVA Check 6-hr heparin level Monitor daily heparin level and CBC Monitor for signs/symptoms of bleeding  06-05-1990, PharmD, BCPS, [facility] Clinical Pharmacist 12/19/2020, 3:46 PM

## 2020-12-19 NOTE — Progress Notes (Signed)
ANTICOAGULATION CONSULT NOTE  Pharmacy Consult for Heparin Indication:  Possible apical thrombus  Allergies  Allergen Reactions  . Ibuprofen Anaphylaxis  . Latex Other (See Comments)    Reaction??  . Peanut-Containing Drug Products Other (See Comments)    Allergic to peanut butter (reaction??)  . Tylenol [Acetaminophen] Hives and Nausea And Vomiting    Patient Measurements: Height: 6\' 1"  (185.4 cm) Weight: 95 kg (209 lb 7 oz) IBW/kg (Calculated) : 79.9 Heparin dosing weight = 95kg  Vital Signs: Temp: 98.2 F (36.8 C) (01/19 0400) Temp Source: Axillary (01/19 0400) BP: 135/99 (01/19 0600) Pulse Rate: 92 (01/19 0600)  Labs: Recent Labs    12/17/20 1310 12/17/20 1312 12/17/20 1558 12/17/20 1624 12/17/20 1800 12/18/20 0336 12/18/20 1034 12/18/20 2238 12/19/20 0533  HGB 15.0 16.3  16.3  --  13.6  --  13.6  --   --  11.6*  HCT 45.4 48.0  48.0  --  40.0  --  42.0  --   --  36.5*  PLT 396  --   --   --   --  317  --   --  272  APTT 27  --   --   --   --   --   --   --   --   LABPROT 17.5*  --   --   --   --   --   --   --   --   INR 1.5*  --   --   --   --   --   --   --   --   HEPARINUNFRC  --   --   --   --   --   --   --  0.20* 0.19*  CREATININE 1.32* 1.10  1.10  --   --   --  1.31*  --   --  1.16  TROPONINIHS  --   --  46*  --  45*  --  42*  --   --     Estimated Creatinine Clearance: 84.2 mL/min (by C-G formula based on SCr of 1.16 mg/dL).   Assessment: 21 YOM with right superior cerebellar artery territory infarct to continue on IV heparin for possible apical thrombus.  Heparin level is sub-therapeutic this AM.  No issue with heparin infusion nor bleeding per RN.  Goal of Therapy:  Heparin level 0.3-0.5 units/ml Monitor platelets by anticoagulation protocol: Yes   Plan:  Increase heparin infusion to 1450 units/hr - no bolus with acute CVA Check 6 hr heparin level Daily heparin level and CBC  Edrick Whitehorn D. 46, PharmD, BCPS, BCCCP 12/19/2020, 8:04  AM

## 2020-12-19 NOTE — Progress Notes (Addendum)
STROKE TEAM PROGRESS NOTE   INTERVAL HISTORY No acute overnight events. Patient evaluated at bedside this morning, no family in the room.  Patient's name is [name] and his chart is not merged with his original chart.  Patient remains intubated, difficult to arouse, obtunded, noncooperative.  Open his eyes after multiple commands 1 time.  Planning to do EEG to rule out any seizures and head CT scan again as MRI findings cannot explain his profound altered mental status and neurological exam.  Also,   at this point we will continue ventilator support but minimizing sedation.  Due to his low ejection fraction, patient was started on IV heparin drip to prevent further embolic strokes.  Heparin level this morning is 0.19. As per pharmacy recommendations Heparin infusion rate increased to 1450 units/h with follow-up check in 6-hour with heparin level.  Blood pressure well controlled.  Neurological exam is mildly changed from yesterday.  Vitals:   12/19/20 0730 12/19/20 0745 12/19/20 0800 12/19/20 0808  BP: (!) 150/112 (!) 136/96 (!) 135/97   Pulse: (!) 107 97 93 (!) 103  Resp: (!) 35 (!) 24 (!) 31 (!) 32  Temp:   97.8 F (36.6 C)   TempSrc:   Axillary   SpO2: 100% 98% 100% 100%  Weight:      Height:       CBC:  Recent Labs  Lab 12/17/20 1310 12/17/20 1312 12/18/20 0336 12/19/20 0533  WBC 9.8  --  11.8* 7.8  NEUTROABS 4.9  --  8.9*  --   HGB 15.0   < > 13.6 11.6*  HCT 45.4   < > 42.0 36.5*  MCV 98.9  --  100.0 100.8*  PLT 396  --  317 272   < > = values in this interval not displayed.   Basic Metabolic Panel:  Recent Labs  Lab 12/18/20 0336 12/18/20 1034 12/18/20 2238 12/19/20 0533  NA 137  --   --  137  K 4.6  --   --  4.3  CL 104  --   --  108  CO2 22  --   --  20*  GLUCOSE 83  --   --  107*  BUN 16  --   --  16  CREATININE 1.31*  --   --  1.16  CALCIUM 8.0*  --   --  7.4*  MG  --    < > 1.8 1.8  PHOS  --    < > 3.2 2.9   < > = values in this interval not  displayed.   Lipid Panel:  Recent Labs  Lab 12/18/20 0823 12/19/20 0533  CHOL 116  --   TRIG 88 52  HDL 26*  --   CHOLHDL 4.5  --   VLDL 18  --   LDLCALC 72  --    HgbA1c: 6.4 Urine Drug Screen:  Recent Labs  Lab 12/17/20 1800  LABOPIA NONE DETECTED  COCAINSCRNUR POSITIVE*  LABBENZ NONE DETECTED  AMPHETMU NONE DETECTED  THCU NONE DETECTED  LABBARB NONE DETECTED    Alcohol Level  Recent Labs  Lab 12/17/20 1310  ETH <10    IMAGING past 24 hours  EEG  12/19/2020 Pending  CT Head wo contrast 12/19/2020  Pending  CT head Code stroke wo contrast 12/17/2020  IMPRESSION: 1. Left cerebellar hypodensity concerning for acute/subacute infarct. 2. No intracranial hemorrhage. 3. ASPECTS is 10 4. Small right parietal scalp hematoma.  CT Angio Head Neck w or wo  contrast 12/17/2020  IMPRESSION: Non opacification of the left vertebral artery with retrograde filling of the distal left V4 segment.   Bilateral carotid bifurcation atheromatous disease with 50% right and 30% left carotid bulb narrowing.   Mild left P2 segment narrowing.   Sub optimal contrast bolus timing limits evaluation.  X-ray Abdomen 12/17/2020  IMPRESSION: Esophageal tube tip overlies the proximal stomach.   MRA Head 12/18/2020  IMPRESSION: MRI HEAD IMPRESSION:   1. 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. No associated mass effect. 2. Chronic left PICA distribution infarct. Abnormal flow void within the left V4 segment consistent with slow flow as seen on corresponding MRA. 3. Underlying mild-to-moderate chronic microvascular ischemic disease.   MRA HEAD IMPRESSION:   1. Acute occlusion of the right superior cerebellar artery. 2. Attenuated but patent flow within the left V4 segment, suggesting a proximal stenosis. 3. Otherwise wide patency of the major intracranial arterial circulation. No other hemodynamically significant or  correctable Stenosis  Echocardiogram 12/18/2020  IMPRESSIONS     1. Left ventricular ejection fraction, by estimation, is <20%. The left  ventricle has severely decreased function. The left ventricle demonstrates  global hypokinesis. The left ventricular internal cavity size was  moderately dilated. There is moderate  left ventricular hypertrophy. Left ventricular diastolic parameters are  consistent with Grade II diastolic dysfunction (pseudonormalization).  Elevated left atrial pressure.   2. Right ventricular systolic function is moderately reduced. The right  ventricular size is mildly enlarged.   3. Left atrial size was moderately dilated.   4. Right atrial size was moderately dilated.   5. The mitral valve is normal in structure. Trivial mitral valve  regurgitation. No evidence of mitral stenosis.   6. The aortic valve is tricuspid. Aortic valve regurgitation is not  visualized. Mild aortic valve sclerosis is present, with no evidence of  aortic valve stenosis.   7. The inferior vena cava is normal in size with greater than 50%  respiratory variability, suggesting right atrial pressure of 3 mmHg.     Physical Exam  Constitutional: Appears well-developed and well-nourished.  Psych: Unable to assess. Unresponsive Eyes: No scleral injection HENT: No OP obstrucion Head: Normocephalic.  Cardiovascular: RRR on tele Respiratory: Effort normal.  GI: Soft.  Skin: WDI   Neuro:  Mental Status: Patient is unresponsive to voice and tactile stimulation. Non verbal and follows no commands.   He opens eyes partially to sternal rub Patient is unable to give a clear and coherent history. Dysconjugate gaze.  With left eyes slightly exotropic but doll's eye movement preserved Speech/Language: non verbal.    Cranial Nerves: II: Pupils are 85mm equal, round, and sluggish.  III,IV, VI: eyes are dysconjugate , doll's eye reflex + V, VII: furrows with brow pressure.  VIII: hearing is  intact to loud voice at r/p exam.  IX, X: + cough XI: head midline XII: pt will not protrude tongue Motor: Tone is flaccid BUE/BLEs. Bulk is normal.   Semipurposeful withdrawal response in the left upper and lower extremity to sternal rub and trace withdrawal in the right upper and lower extremity only  sensory: No withdrawal to pain  Plantars: Toes are downgoing bilaterally. Cerebellar: No tremors.  Gait: deferred.   ASSESSMENT/PLAN Mr. Square Jowett is a 55 y.o. male with a questionable PMHx of CHF and DM II per bystanders who presents to [facility] ED via EMS after chest pain complaint followed by unresponsiveness followed by unresponsiveness and vomiting and EMS noticed right-sided movements but left  gaze deviation and left hemiparesis. CT scans showed subacute left cerebellar infarct with possible dense basilar artery sign but CT angiogram was of poor quality due to timing of dye injection hence patient was taken for emergent cerebral catheter angiogram and was intubated for airway protection. IV tPA considered but not given because diagnosis of stroke was not certain given lack of significant focal findings except gaze deviation and CT scan showing a subacute cerebellar infarct patient unable to give clear history. No LVO on diagnostic angiogram. MRI shows acute ischemic nonhemorrhagic right superior cerebellar artery.   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  His neurological exam and mental status seem very out of proportion to his infarct and underlying encephalopathy from cocaine intoxication, global hypoperfusion and abnormal liver function contributing  code Stroke CT head; No intracranial hemorrhage  CTA head & neck: Non opacification of the left vertebral artery with retrograde filling of the distal left V4 segment.  MRI:  Acute ischemic nonhemorrhagic right superior cerebellar artery  territory infarct   2D Echo: Left ventricular ejection fraction, by estimation, is <20%. The left  ventricle has severely decreased function.   LDL : 72  HgbA1c: 6.4  EEG: severe diffuse encephalopathy, nonspecific etiology but could be secondary to sedation, toxic-metabolic etiology, anoxic/hypoxic brain injury. No seizures or epileptiform discharges were seen throughout the recording.  VTE prophylaxis -none, on IV heparin drip    Diet   Diet NPO time specified    Unavailable  prior to admission, now on aspirin 81 mg daily.   Therapy recommendations: Pending  Disposition: Pending  Hypertension  Home meds: Unavailable  UStable . Permissive hypertension (OK if < 220/120) but gradually normalize in 5-7 days . Long-term BP goal normotensive  Hyperlipidemia  Home meds: Unavailable  Start Lipitor 40 mg  LDL: 72  Continue statin at discharge  Diabetes type II   Home meds: Unavailable  HgbA1c: 6.4  CBGs Recent Labs    12/18/20 2307 12/19/20 0321 12/19/20 0731  GLUCAP 99 94 111*       Other Stroke Risk Factors  Substance abuse : Cocaine POSITIVE. Patient will be advised to stop using due to stroke risk.  Systolic and diastolic congestive heart failure  Other Active Problems  Acute hypoxic respiratory failure: Continue ventilatory support, lighten sedation as able.  Acute encephalopathy metabolic/toxic: Lab results pending, minimize sedation as able, supportive care, delirium precautions.  Systolic congestive heart failure: Echocardiogram suggestive of severe systolic dysfunction  Transaminitis: Trend LFTs, trend CMP, hepatitis panel negative.  Hypoglycemia: Monitor CBG with goal 140-180  Hospital day # 2 I have personally obtained history,examined this patient, reviewed notes, independently viewed imaging studies, participated in medical decision making and plan of care.ROS completed by me personally and pertinent positives fully documented  I have  made any additions or clarifications directly to the above note. Agree with note above.  Patient presented with altered mental status with left gaze deviation and his initial mental status and exam and finding was way out of proportion to her right superior cerebellar artery infarct and cerebral catheter angiogram did not show large vessel occlusion which was not amenable to endovascular therapy.  Continue supportive care for now.  continue IV heparin given his low cardiac ejection fraction and high risk for cardiogenic embolism.  Appreciate Cardiology consult to help manage his heart failure.   Plan repeat CT head today to look for any interval strokes as well as EEG to  look for seizure activity continue ventilatory support and extubate as tolerated as per CCM.  Discussed with Dr. Lynnell Catalan critical care medicine.  Discussed with cardiology. This patient is critically ill and at significant risk of neurological worsening, death and care requires constant monitoring of vital signs, hemodynamics,respiratory and cardiac monitoring, extensive review of multiple databases, frequent neurological assessment, discussion with family, other specialists and medical decision making of high complexity.I have made any additions or clarifications directly to the above note.This critical care time does not reflect procedure time, or teaching time or supervisory time of PA/NP/Med Resident etc but could involve care discussion time.  I spent 32 minutes of neurocritical care time  in the care of  this patient.      Delia Heady, MD Medical Director [facility] Pager: [phone] 12/19/2020 10:44 AM   To contact Stroke Continuity provider, please refer to [email or web address]. After hours, contact General Neurology

## 2020-12-19 NOTE — Progress Notes (Signed)
EEG complete - results pending 

## 2020-12-19 NOTE — Progress Notes (Signed)
Patient was transported to CT & back to 4N17 without any complications.

## 2020-12-19 NOTE — Procedures (Signed)
Patient Name: [name] MRN: [id number]  Epilepsy Attending: Charlsie Quest  Referring Physician/Provider: Dr Geralynn Rile Dagar Date: 12/19/2020 Duration: 25.09 mins  Patient history: unknown age male with ams, left gaze preference. EEG to evaluate for seizure.   Level of alertness: comatose  AEDs during EEG study: Propofol  Technical aspects: This EEG study was done with scalp electrodes positioned according to the 10-20 International system of electrode placement. Electrical activity was acquired at a sampling rate of 500Hz  and reviewed with a high frequency filter of 70Hz  and a low frequency filter of 1Hz . EEG data were recorded continuously and digitally stored.   Description: EEG showed continuous generalized 6 to 7 Hz theta slowing.  Right >left frontal spikes were also noted, maximal Hyperventilation and photic stimulation were not performed.     ABNORMALITY - Spikes, right> left frontal -Continuous slow, generalized  IMPRESSION: This study showed evidence of epileptogenicity arising from right>left frontal region.  There is also moderate diffuse encephalopathy, nonspecific etiology but could be secondary to sedation. No seizures were seen throughout the recording.  Dr was notified  Eufemio Strahm 

## 2020-12-19 NOTE — Progress Notes (Addendum)
NAME:  [name], MRN:  [id number], DOB:  03/15/1966, LOS: 2 ADMISSION DATE:  12/17/2020, CONSULTATION DATE: 12/17/2020 REFERRING MD: Dr. Pearlean Brownie, CHIEF COMPLAINT: Unresponsiveness  Brief History:  Middle-aged male with unknown identity was brought in to the emergency department after he complained of chest pain and became unresponsive with left-sided weakness and left gaze deviation. Underwent angiogram but no intervention, remained intubated  History of Present Illness:  Middle-aged African-American male with unknown identity and past medical history was [place] emergency department [place]  Per bystander and patient complained of chest pain and all of a sudden he became unresponsive, in the ED was noted to have left sided flaccid paresis with forced left gaze deviation.  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  Past Medical History:  Unknown  Significant Hospital Events:  ETT 1/17  Consults:  Neurology PCCM  Procedures:  1/17 cerebral angiogram  Significant Diagnostic Tests:  1/17 CT head: Left cerebellar hypodensity concerning for acute/subacute infarct. No intracranial hemorrhage. Small right parietal scalp hematoma  1/17 CTA head and neck: Non opacification of the left vertebral artery with retrograde filling of the distal left V4 segment. Bilateral carotid bifurcation atheromatous disease with 50% right and 30% left carotid bulb narrowing. Mild left P2 segment narrowing.  Micro Data:  1/17 influenza/COVID PCR negative  Antimicrobials:  Cefazolin 1/17  Interim History / Subjective:  No acute issue overnight   Continues to develop significant agitation when sedation lightened   Objective   Blood pressure (!) 136/96, pulse 97, temperature 98.2 F (36.8 C), temperature source Axillary, resp. rate (!) 24, height 6\' 1"  (1.854 m), weight 95 kg, SpO2 98 %.    Vent Mode: PRVC FiO2 (%):  [30 %] 30 % Set Rate:  [20 bmp] 20 bmp Vt Set:  [510 mL] 510 mL PEEP:  [5 cmH20] 5 cmH20 Plateau Pressure:  [14 cmH20-17 cmH20] 16 cmH20   Intake/Output Summary (Last 24 hours) at 12/19/2020 0804 Last data filed at 12/19/2020 0800 Gross per 24 hour  Intake 3420.52 ml  Output 700 ml  Net 2720.52 ml   Filed Weights   12/17/20 1300 12/19/20 0445  Weight: 95.1 kg 95 kg    Examination: General: Chronically ill appearing middle aged male lying in bed on mechanical ventilation, in NAD HEENT: ETT, MM pink/moist, PERRL,  Neuro: Opend eyes to verbal stimuli but unable to follow commands, seen spontaneously moving right upper extremity  CV: s1s2 regular rate and rhythm, no murmur, rubs, or gallops,  PULM:  Clear to ascultation bilaterally, no added breath sounds, attempting SBT now GI: soft, bowel sounds active in all 4 quadrants, non-tender, non-distended, tolerating TF Extremities: warm/dry, no edema  Skin: no rashes or lesions  Resolved Hospital Problem list   Hypothermia Hypoglycemia  Assessment & Plan:  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 -Patient presented with flaccid left-sided weakness and left gaze deviation -CT head confirmed acute/subacute left cerebellar stroke -Patient underwent cerebral angiogram but no intervention was done due to no LVO P: Management per neurology  Maintain neuro protective measures; goal for eurothermia, euglycemia, eunatermia, normoxia, and PCO2 goal of 35-40 Nutrition and bowel regiment  Seizure precautions  AEDs per neurology  Aspirations precautions  Continue ASA and statin  Continue secondary stroke prevention   Chronic  systolic and diastolic congestive heart failure -Formal ECHO with EF <20% and grade II diastolic dysfunction  CAD HX of HTN  -Home medications include Coreg, Clonidine, Lasix, Hydralazine  Questionable apical thumbus P: Cardiology following  Start Coreg  Continue hydralizine Strict I&O Closely monitor volume status  Remains on heparin drip  Questionable seizure activity -Patient had left-sided jerking movements in the ED, EEG was done which shows diffuse encephalopathy, no active seizures were noted on EEG P: Consider resumption of home Depakote (mood stabilizer) Seisure precautions  Ativan as needed   Acute hypoxic respiratory failure -Due to above P: Continue ventilator support with lung protective strategies  Wean PEEP and FiO2 for sats greater than 90%. Head of bed elevated 30 degrees. Plateau pressures less than 30 cm H20.  Follow intermittent chest x-ray and ABG.   SAT/SBT as tolerated, mentation preclude extubation  Ensure adequate pulmonary hygiene  Follow cultures  VAP bundle in place  PAD protocol RASS goal 0 to -1  Acute encephalopathy metabolic/toxic P: Minimize sedation  Exchange propofol drip for precedex  Delirium precautions  Supportive care   Acute hepatitis, likely due to low flow state -LFTs elevated on admit P: Intermittently trend LFTs  Best practice (evaluated daily)  Diet: NPO Pain/Anxiety/Delirium protocol (if indicated): Propofol VAP protocol (if indicated): Ordered DVT prophylaxis: Subcu Lovenox GI prophylaxis: Protonix Glucose control: SSI Mobility: Bedrest Disposition: ICU  Goals of Care:   Code Status: Full code  Labs   CBC: Recent Labs  Lab 12/17/20 1310 12/17/20 1312 12/17/20 1624 12/18/20 0336 12/19/20 0533  WBC 9.8  --   --  11.8* 7.8  NEUTROABS 4.9  --   --  8.9*  --   HGB 15.0 16.3  16.3 13.6 13.6 11.6*  HCT 45.4 48.0  48.0 40.0 42.0 36.5*  MCV 98.9  --   --  100.0 100.8*  PLT 396  --   --  317 272     Basic Metabolic Panel: Recent Labs  Lab 12/17/20 1310 12/17/20 1312 12/17/20 1624 12/18/20 0336 12/18/20 1034 12/18/20 2238 12/19/20 0533  NA 138 137  137 137 137  --   --  137  K 4.3 4.3  4.3 3.8 4.6  --   --  4.3  CL 102 105  105  --  104  --   --  108  CO2 22  --   --  22  --   --  20*  GLUCOSE 108* 105*  105*  --  83  --   --  107*  BUN 17 21*  21  --  16  --   --  16  CREATININE 1.32* 1.10  1.10  --  1.31*  --   --  1.16  CALCIUM 8.6*  --   --  8.0*  --   --  7.4*  MG  --   --   --   --  1.8 1.8 1.8  PHOS  --   --   --   --  3.0 3.2 2.9   GFR: Estimated Creatinine Clearance: 84.2 mL/min (by C-G formula based on SCr of 1.16 mg/dL). Recent Labs  Lab 12/17/20 1310 12/17/20 2155 12/18/20 0336 12/19/20 0533  WBC 9.8  --  11.8* 7.8  LATICACIDVEN  --  1.1  --   --     Liver Function Tests: Recent Labs  Lab 12/17/20 1310 12/18/20 0336  AST 282* 174*  ALT 376* 284*  ALKPHOS 165* 129*  BILITOT 1.7* 1.1  PROT 6.9 5.6*  ALBUMIN 2.8* 2.3*   No results for input(s): LIPASE, AMYLASE in the last 168 hours. Recent Labs  Lab 12/18/20 0800  AMMONIA 24    ABG    Component Value Date/Time   PHART 7.367 12/17/2020 1624   PCO2ART 42.2 12/17/2020 1624   PO2ART 107 12/17/2020 1624   HCO3 24.6 12/17/2020 1624   TCO2 26 12/17/2020 1624   ACIDBASEDEF 1.0 12/17/2020 1624   O2SAT 98.0 12/17/2020 1624     Coagulation Profile: Recent Labs  Lab 12/17/20 1310  INR 1.5*    Cardiac Enzymes: No results for input(s): CKTOTAL, CKMB, CKMBINDEX, TROPONINI in the last 168 hours.  HbA1C: Hgb A1c MFr Bld  Date/Time Value Ref Range Status  12/18/2020 09:00 AM 6.4 (H) 4.8 - 5.6 % Final    Comment:    (NOTE) Pre diabetes:          5.7%-6.4%  Diabetes:              >6.4%  Glycemic control for   <7.0% adults with diabetes     CBG: Recent Labs  Lab 12/18/20 1130 12/18/20 1910 12/18/20 2307 12/19/20 0321 12/19/20 0731  GLUCAP 30* 80 99 94 111*      Total critical care time: 38  minutes  Performed by: Cheri Fowler   Critical care time was exclusive of separately billable procedures and treating other patients.   Critical care was necessary to treat or prevent imminent or life-threatening deterioration.   Critical care was time spent personally by me on the following activities: development of treatment plan with patient and/or surrogate as well as nursing, discussions with consultants, evaluation of patient's response to treatment, examination of patient, obtaining history from patient or surrogate, ordering and performing treatments and interventions, ordering and review of laboratory studies, ordering and review of radiographic studies, pulse oximetry and re-evaluation of patient's condition.   Delfin [name], NP-C [facility] Contact / Pager information can be found on Amion  12/19/2020, 8:04 AM

## 2020-12-19 NOTE — Progress Notes (Signed)
ANTICOAGULATION CONSULT NOTE  Pharmacy Consult for Heparin Indication:  Possible Apical Thrombus  Allergies  Allergen Reactions  . Ibuprofen Anaphylaxis  . Latex Other (See Comments)    Reaction??  . Peanut-Containing Drug Products Other (See Comments)    Allergic to peanut butter (reaction??)  . Tylenol [Acetaminophen] Hives and Nausea And Vomiting    Patient Measurements: Height: 6\' 1"  (185.4 cm) Weight: 95 kg (209 lb 7 oz) IBW/kg (Calculated) : 79.9 Heparin dosing weight: 95 kg  Vital Signs: Temp: 97.6 F (36.4 C) (01/19 2000) Temp Source: Axillary (01/19 2000) BP: 98/76 (01/19 2100) Pulse Rate: 51 (01/19 2100)  Labs: Recent Labs    12/17/20 1310 12/17/20 1312 12/17/20 1558 12/17/20 1624 12/17/20 1800 12/18/20 0336 12/18/20 1034 12/18/20 2238 12/19/20 0533 12/19/20 1418 12/19/20 2207  HGB 15.0 16.3  16.3  --  13.6  --  13.6  --   --  11.6*  --   --   HCT 45.4 48.0  48.0  --  40.0  --  42.0  --   --  36.5*  --   --   PLT 396  --   --   --   --  317  --   --  272  --   --   APTT 27  --   --   --   --   --   --   --   --   --   --   LABPROT 17.5*  --   --   --   --   --   --   --   --   --   --   INR 1.5*  --   --   --   --   --   --   --   --   --   --   HEPARINUNFRC  --   --   --   --   --   --   --    < > 0.19* 0.22* 0.24*  CREATININE 1.32* 1.10  1.10  --   --   --  1.31*  --   --  1.16  --   --   TROPONINIHS  --   --  46*  --  45*  --  42*  --   --   --   --    < > = values in this interval not displayed.    Estimated Creatinine Clearance: 84.2 mL/min (by C-G formula based on SCr of 1.16 mg/dL).   Assessment: 55 yr old man with right superior cerebellar artery territory infarct continuing on IV heparin for possible apical thrombus (no boluses, due to CVA).    Heparin level remains subtherapeutic (0.24) on gtt at 1550 units/hr. No issues with line or bleeding reported per RN.  Goal of Therapy:  Heparin level 0.3-0.5 units/ml Monitor platelets by  anticoagulation protocol: Yes   Plan:  Increase heparin infusion to 1650 units/hr  Check 6-hr heparin level  44, PharmD, BCPS Please see amion for complete clinical pharmacist phone list 12/19/2020, 11:26 PM

## 2020-12-20 DIAGNOSIS — I6300 Cerebral infarction due to thrombosis of unspecified precerebral artery: Secondary | ICD-10-CM | POA: Diagnosis not present

## 2020-12-20 DIAGNOSIS — I651 Occlusion and stenosis of basilar artery: Secondary | ICD-10-CM | POA: Diagnosis not present

## 2020-12-20 DIAGNOSIS — I5043 Acute on chronic combined systolic (congestive) and diastolic (congestive) heart failure: Secondary | ICD-10-CM | POA: Diagnosis present

## 2020-12-20 DIAGNOSIS — J9690 Respiratory failure, unspecified, unspecified whether with hypoxia or hypercapnia: Secondary | ICD-10-CM | POA: Diagnosis present

## 2020-12-20 DIAGNOSIS — J9601 Acute respiratory failure with hypoxia: Secondary | ICD-10-CM | POA: Diagnosis present

## 2020-12-20 DIAGNOSIS — J9600 Acute respiratory failure, unspecified whether with hypoxia or hypercapnia: Secondary | ICD-10-CM

## 2020-12-20 LAB — GLUCOSE, CAPILLARY
Glucose-Capillary: 43 mg/dL — CL (ref 70–99)
Glucose-Capillary: 112 mg/dL — ABNORMAL HIGH (ref 70–99)
Glucose-Capillary: 112 mg/dL — ABNORMAL HIGH (ref 70–99)
Glucose-Capillary: 104 mg/dL — ABNORMAL HIGH (ref 70–99)
Glucose-Capillary: 106 mg/dL — ABNORMAL HIGH (ref 70–99)

## 2020-12-20 LAB — CBC
HCT: 32.8 % — ABNORMAL LOW (ref 39.0–52.0)
Hemoglobin: 11.1 g/dL — ABNORMAL LOW (ref 13.0–17.0)
MCH: 33.4 pg (ref 26.0–34.0)
MCHC: 33.8 g/dL (ref 30.0–36.0)
MCV: 98.8 fL (ref 80.0–100.0)
Platelets: 261 10*3/uL (ref 150–400)
RBC: 3.32 MIL/uL — ABNORMAL LOW (ref 4.22–5.81)
RDW: 16.3 % — ABNORMAL HIGH (ref 11.5–15.5)
WBC: 7 10*3/uL (ref 4.0–10.5)
nRBC: 0 % (ref 0.0–0.2)

## 2020-12-20 LAB — BASIC METABOLIC PANEL
Anion gap: 8 (ref 5–15)
BUN: 16 mg/dL (ref 6–20)
CO2: 21 mmol/L — ABNORMAL LOW (ref 22–32)
Calcium: 7.9 mg/dL — ABNORMAL LOW (ref 8.9–10.3)
Chloride: 110 mmol/L (ref 98–111)
Creatinine, Ser: 1.02 mg/dL (ref 0.61–1.24)
GFR, Estimated: >60 mL/min (ref >60)
Glucose, Bld: 148 mg/dL — ABNORMAL HIGH (ref 70–99)
Potassium: 4 mmol/L (ref 3.5–5.1)
Sodium: 139 mmol/L (ref 135–145)

## 2020-12-20 LAB — RPR: RPR Ser Ql: NONREACTIVE

## 2020-12-20 LAB — HEPARIN LEVEL (UNFRACTIONATED): Heparin Unfractionated: 0.3 IU/mL (ref 0.30–0.70)

## 2020-12-20 MED ORDER — FUROSEMIDE 10 MG/ML IJ SOLN
40.0000 mg | Freq: Two times a day (BID) | INTRAMUSCULAR | Status: AC
  Administered 2020-12-20 – 2020-12-22 (×4): 40 mg via INTRAVENOUS
  Filled 2020-12-20 (×5): qty 4

## 2020-12-20 MED ORDER — ASPIRIN 81 MG PO CHEW
81.0000 mg | CHEWABLE_TABLET | Freq: Every day | ORAL | Status: DC
  Administered 2020-12-21 – 2021-02-02 (×36): 81 mg via ORAL
  Filled 2020-12-20 (×41): qty 1

## 2020-12-20 MED ORDER — ATORVASTATIN CALCIUM 40 MG PO TABS
40.0000 mg | ORAL_TABLET | Freq: Every day | ORAL | Status: DC
  Administered 2020-12-20: 40 mg via ORAL
  Filled 2020-12-20: qty 1

## 2020-12-20 MED ORDER — LORAZEPAM 2 MG/ML IJ SOLN: 1.0000 mg | Freq: Once | INTRAMUSCULAR | Status: DC

## 2020-12-20 MED ORDER — SACUBITRIL-VALSARTAN 24-26 MG PO TABS
1.0000 | ORAL_TABLET | Freq: Two times a day (BID) | ORAL | Status: DC
  Administered 2020-12-20 – 2021-01-24 (×49): 1 via ORAL
  Filled 2020-12-20 (×73): qty 1

## 2020-12-20 MED ORDER — DOCUSATE SODIUM 50 MG/5ML PO LIQD
100.0000 mg | Freq: Every day | ORAL | Status: DC | PRN
  Administered 2021-01-03 – 2021-01-06 (×2): 100 mg via ORAL
  Filled 2020-12-20 (×2): qty 10

## 2020-12-20 MED ORDER — FUROSEMIDE 10 MG/ML IJ SOLN
40.0000 mg | Freq: Once | INTRAMUSCULAR | Status: AC
  Administered 2020-12-20: 40 mg via INTRAVENOUS
  Filled 2020-12-20: qty 4

## 2020-12-20 NOTE — Progress Notes (Signed)
NAME:  [name], MRN:  [id number], DOB:  24-Feb-1966, LOS: 3 ADMISSION DATE:  12/17/2020, CONSULTATION DATE: 12/17/2020 REFERRING MD: Dr. Pearlean Brownie, CHIEF COMPLAINT: Unresponsiveness  Brief History:  Middle-aged male with unknown identity was brought in to the emergency department after he complained of chest pain and became unresponsive with left-sided weakness and left gaze deviation. Underwent angiogram but no intervention, remained intubated  History of Present Illness:  Middle-aged African-American male with unknown identity and past medical history was [place] emergency [place]  Per bystander and patient complained of chest pain and all of a sudden he became unresponsive, in the ED was noted to have left sided flaccid paresis with forced left gaze deviation.  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  Past Medical History:  Unknown  Significant Hospital Events:  ETT 1/17  Consults:  Neurology PCCM  Procedures:  1/17 cerebral angiogram  Significant Diagnostic Tests:  1/17 CT head: Left cerebellar hypodensity concerning for acute/subacute infarct. No intracranial hemorrhage. Small right parietal scalp hematoma  1/17 CTA head and neck: Non opacification of the left vertebral artery with retrograde filling of the distal left V4 segment. Bilateral carotid bifurcation atheromatous disease with 50% right and 30% left carotid bulb narrowing. Mild left P2 segment narrowing.  2D echo 1/18>>>EF <20%, hypokinesis, mod LVH, grade 2 diastolic dysfunction,   EEG 1/19>> This study showed evidence of epileptogenicity arising  from right>left frontal region.  There is also moderate diffuse encephalopathy, nonspecific etiology butcould be secondary tosedation.No seizures were seen throughout the recording.  Micro Data:  1/17 influenza/COVID PCR negative  Antimicrobials:  Cefazolin 1/17  Interim History / Subjective:  Intermittent bradycardia Otherwise no sig change overnight  Tol PS wean 10/5  Objective   Blood pressure 117/82, pulse 74, temperature 97.9 F (36.6 C), temperature source Axillary, resp. rate (!) 21, height 6\' 1"  (1.854 m), weight 95 kg, SpO2 98 %.    Vent Mode: CPAP;PSV FiO2 (%):  [30 %] 30 % Set Rate:  [20 bmp] 20 bmp Vt Set:  [510 mL] 510 mL PEEP:  [5 cmH20] 5 cmH20 Pressure Support:  [10 cmH20] 10 cmH20 Plateau Pressure:  [14 cmH20-17 cmH20] 14 cmH20   Intake/Output Summary (Last 24 hours) at 12/20/2020 0912 Last data filed at 12/20/2020 0800 Gross per 24 hour  Intake 3636.68 ml  Output 775 ml  Net 2861.68 ml   Filed Weights   12/17/20 1300 12/19/20 0445 12/20/20 0500  Weight: 95.1 kg 95 kg 95 kg    Examination: General: Chronically ill appearing middle aged male lying in bed on mechanical ventilation, in NAD HEENT: ETT, MM pink/moist, PERRL,  Neuro: drowsy but easily woken, intermittent agitation per RN, for me RASS -1, opens eyes to name, follows commands, nods appropriately. 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 -Patient presented with flaccid left-sided weakness and left gaze deviation -CT head confirmed acute/subacute left cerebellar  stroke -Patient underwent cerebral angiogram but no intervention was done due to no LVO P: Management per neurology  Maintain neuro protective measures; goal for eurothermia, euglycemia, eunatermia, normoxia, and PCO2 goal of 35-40 Nutrition and bowel regiment  Seizure precautions  AEDs per neurology  Aspirations precautions  Continue ASA and statin  Continue secondary stroke prevention   Chronic systolic and diastolic congestive heart failure -Formal ECHO with EF <20% and grade II diastolic dysfunction  CAD HX of HTN  -Home medications include Coreg, Clonidine, Lasix, Hydralazine  Questionable apical thrombus P: Cardiology following  Hold Coreg for now with bradycardia - hope will improve with weaning precedex and can resume Continue hydralazine, lasix, nitro patch Strict I&O Closely monitor volume status  Remains on heparin drip  Questionable seizure activity -Patient had left-sided jerking movements in the ED, EEG was done which shows diffuse encephalopathy, no active seizures were noted on EEG P: Consider resumption of home Depakote (mood stabilizer) Seisure precautions  Ativan as needed  EEG does not suggest seizure   Acute hypoxic respiratory failure -Due to above P: Vent support - 8cc/kg  F/u CXR  F/u ABG Continue PS wean as tol - may be nearing extubation  VAP bundle in place  PAD protocol RASS goal 0 to -1  Acute encephalopathy metabolic/toxic P: Minimize sedation  Exchange propofol drip for precedex  Delirium precautions  Supportive care   Acute hepatitis, likely due to low flow state -LFTs elevated on admit P: Intermittently trend LFTs  Best practice (evaluated daily)  Diet: NPO Pain/Anxiety/Delirium protocol (if indicated): Propofol VAP protocol (if indicated): Ordered DVT prophylaxis: Subcu Lovenox GI prophylaxis: Protonix Glucose control: SSI Mobility: Bedrest Disposition: ICU  Goals of Care:   Code Status: Full code  Labs    CBC: Recent Labs  Lab 12/17/20 1310 12/17/20 1312 12/17/20 1624 12/18/20 0336 12/19/20 0533 12/20/20 0527  WBC 9.8  --   --  11.8* 7.8 7.0  NEUTROABS 4.9  --   --  8.9*  --   --   HGB 15.0 16.3  16.3 13.6 13.6 11.6* 11.1*  HCT 45.4 48.0  48.0 40.0 42.0 36.5* 32.8*  MCV 98.9  --   --  100.0 100.8* 98.8  PLT 396  --   --  317 272 261    Basic Metabolic Panel: Recent Labs  Lab 12/17/20 1310 12/17/20 1312 12/17/20 1624 12/18/20 0336 12/18/20 1034 12/18/20 2238 12/19/20 0533 12/19/20 1418  NA 138 137  137 137 137  --   --  137  --   K 4.3 4.3  4.3 3.8 4.6  --   --  4.3  --   CL 102 105  105  --  104  --   --  108  --   CO2 22  --   --  22  --   --  20*  --   GLUCOSE 108* 105*  105*  --  83  --   --  107*  --   BUN 17 21*  21  --  16  --   --  16  --   CREATININE 1.32* 1.10  1.10  --  1.31*  --   --  1.16  --   CALCIUM 8.6*  --   --  8.0*  --   --  7.4*  --   MG  --   --   --   --  1.8 1.8 1.8 1.9  PHOS  --   --   --   --  3.0 3.2 2.9 3.4   GFR: Estimated Creatinine Clearance: 84.2 mL/min (by C-G formula based on SCr of 1.16 mg/dL). Recent Labs  Lab 12/17/20 1310 12/17/20 2155 12/18/20 0336 12/19/20 0533 12/20/20 0527  WBC 9.8  --  11.8* 7.8 7.0  LATICACIDVEN  --  1.1  --   --   --     Liver Function Tests: Recent Labs  Lab 12/17/20 1310 12/18/20 0336  AST 282* 174*  ALT 376* 284*  ALKPHOS 165* 129*  BILITOT 1.7* 1.1  PROT 6.9 5.6*  ALBUMIN 2.8* 2.3*   No results for input(s): LIPASE, AMYLASE in the last 168 hours. Recent Labs  Lab 12/18/20 0800  AMMONIA 24    ABG    Component Value Date/Time   PHART 7.367 12/17/2020 1624   PCO2ART 42.2 12/17/2020 1624   PO2ART 107 12/17/2020 1624   HCO3 24.6 12/17/2020 1624   TCO2 26 12/17/2020 1624   ACIDBASEDEF 1.0 12/17/2020 1624   O2SAT 98.0 12/17/2020 1624     Coagulation Profile: Recent Labs  Lab 12/17/20 1310  INR 1.5*    Cardiac Enzymes: No results for input(s): CKTOTAL, CKMB,  CKMBINDEX, TROPONINI in the last 168 hours.  HbA1C: Hgb A1c MFr Bld  Date/Time Value Ref Range Status  12/18/2020 09:00 AM 6.4 (H) 4.8 - 5.6 % Final    Comment:    (NOTE) Pre diabetes:          5.7%-6.4%  Diabetes:              >6.4%  Glycemic control for   <7.0% adults with diabetes     CBG: Recent Labs  Lab 12/19/20 1500 12/19/20 1930 12/19/20 2316 12/20/20 0306 12/20/20 0733  GLUCAP 103* 121* 139* 112* 112*     Total critical care time: 32 minutes   Critical care time was exclusive of separately billable procedures and treating other patients.   Critical care was necessary to treat or prevent imminent or life-threatening deterioration.   Critical care was time spent personally by me on the following activities: development of treatment plan with patient and/or surrogate as well as nursing, discussions with consultants, evaluation of patient's response to treatment, examination of patient, obtaining history from patient or surrogate, ordering and performing treatments and interventions, ordering and review of laboratory studies, ordering and review of radiographic studies, pulse oximetry and re-evaluation of patient's condition.   Dirk Dress, NP Pulmonary/Critical Care Medicine  12/20/2020  9:12 AM

## 2020-12-20 NOTE — Progress Notes (Signed)
DAILY PROGRESS NOTE   Patient Name: [name] Date of Encounter: 12/20/2020 Cardiologist: Minus Breeding, MD  Chief Complaint   Intubated  Patient Profile   [name] is a 55 y.o. male with no known history who is being seen today for the evaluation of cardiomyopathy at the request of Dr Leonie Man.  Subjective   Signaling to me that he wants to be extubated today. Looks volume, up -written for lasix by PCCM.  Objective   Vitals:   12/20/20 0728 12/20/20 0800 12/20/20 0900 12/20/20 1000  BP: 108/66 117/82 106/78 101/72  Pulse: (!) 59 74 63 60  Resp: 20 (!) 21 (!) 22 18  Temp:  97.9 F (36.6 C)    TempSrc:  Axillary    SpO2: 97% 98% 99% 98%  Weight:      Height:        Intake/Output Summary (Last 24 hours) at 12/20/2020 1102 Last data filed at 12/20/2020 0900 Gross per 24 hour  Intake 3467.62 ml  Output 775 ml  Net 2692.62 ml   Filed Weights   12/17/20 1300 12/19/20 0445 12/20/20 0500  Weight: 95.1 kg 95 kg 95 kg    Physical Exam   General appearance: alert, no distress and intubated on vent Neck: JVD - 3 cm above sternal notch, no carotid bruit and thyroid not enlarged, symmetric, no tenderness/mass/nodules Lungs: diminished breath sounds bibasilar Heart: regular rate and rhythm Abdomen: soft, non-tender; bowel sounds normal; no masses,  no organomegaly Extremities: extremities normal, atraumatic, no cyanosis or edema Pulses: 2+ and symmetric Skin: Skin color, texture, turgor normal. No rashes or lesions Neurologic: Alert and oriented X 3, normal strength and tone. Normal symmetric reflexes. Normal coordination and gait Psych: cannot assess  Inpatient Medications    Scheduled Meds:  aspirin  81 mg Per Tube Daily   atorvastatin  40 mg Per Tube QHS   chlorhexidine gluconate (MEDLINE KIT)  15 mL Mouth Rinse BID   Chlorhexidine Gluconate Cloth  6 each Topical Daily   docusate  100 mg Per Tube BID   feeding supplement (PROSource TF)  45 mL Per  Tube TID   furosemide  20 mg Per Tube Daily   hydrALAZINE  25 mg Per Tube TID   LORazepam  1 mg Intravenous Once   mouth rinse  15 mL Mouth Rinse 10 times per day   nitroGLYCERIN  0.4 mg Transdermal Daily   pantoprazole sodium  40 mg Per Tube QHS   polyethylene glycol  17 g Per Tube Daily   silver sulfADIAZINE   Topical BID    Continuous Infusions:  dexmedetomidine (PRECEDEX) IV infusion 0.3 mcg/kg/hr (12/20/20 0900)   feeding supplement (VITAL AF 1.2 CAL) 1,000 mL (12/20/20 0730)   heparin 1,700 Units/hr (12/20/20 0900)    PRN Meds: acetaminophen **OR** acetaminophen (TYLENOL) oral liquid 160 mg/5 mL **OR** acetaminophen, fentaNYL (SUBLIMAZE) injection, fentaNYL (SUBLIMAZE) injection, ondansetron (ZOFRAN) IV   Labs   Results for orders placed or performed during the hospital encounter of 12/17/20 (from the past 48 hour(s))  Glucose, capillary     Status: Abnormal   Collection Time: 12/18/20 11:30 AM  Result Value Ref Range   Glucose-Capillary 30 (LL) 70 - 99 mg/dL    Comment: Glucose reference range applies only to samples taken after fasting for at least 8 hours.   Comment 1 Notify RN   Glucose, capillary     Status: None   Collection Time: 12/18/20  7:10 PM  Result Value Ref Range  Glucose-Capillary 80 70 - 99 mg/dL    Comment: Glucose reference range applies only to samples taken after fasting for at least 8 hours.  Magnesium     Status: None   Collection Time: 12/18/20 10:38 PM  Result Value Ref Range   Magnesium 1.8 1.7 - 2.4 mg/dL    Comment: Performed at [facility], [place] [address], [place], [place] [address]  Phosphorus     Status: None   Collection Time: 12/18/20 10:38 PM  Result Value Ref Range   Phosphorus 3.2 2.5 - 4.6 mg/dL    Comment: Performed at [facility], [place] [address], [place], [place] [address]  Heparin level (unfractionated)     Status: Abnormal   Collection Time: 12/18/20 10:38 PM  Result Value Ref Range   Heparin  Unfractionated 0.20 (L) 0.30 - 0.70 IU/mL    Comment: (NOTE) If heparin results are below expected values, and patient dosage has  been confirmed, suggest follow up testing of antithrombin III levels. Performed at [facility], [place] [address] [place], [place] [address]   Glucose, capillary     Status: None   Collection Time: 12/18/20 11:07 PM  Result Value Ref Range   Glucose-Capillary 99 70 - 99 mg/dL    Comment: Glucose reference range applies only to samples taken after fasting for at least 8 hours.  Glucose, capillary     Status: None   Collection Time: 12/19/20  3:21 AM  Result Value Ref Range   Glucose-Capillary 94 70 - 99 mg/dL    Comment: Glucose reference range applies only to samples taken after fasting for at least 8 hours.  Basic metabolic panel     Status: Abnormal   Collection Time: 12/19/20  5:33 AM  Result Value Ref Range   Sodium 137 135 - 145 mmol/L   Potassium 4.3 3.5 - 5.1 mmol/L   Chloride 108 98 - 111 mmol/L   CO2 20 (L) 22 - 32 mmol/L   Glucose, Bld 107 (H) 70 - 99 mg/dL    Comment: Glucose reference range applies only to samples taken after fasting for at least 8 hours.   BUN 16 6 - 20 mg/dL   Creatinine, Ser 1.16 0.61 - 1.24 mg/dL   Calcium 7.4 (L) 8.9 - 10.3 mg/dL   GFR, Estimated >60 >60 mL/min    Comment: (NOTE) Calculated using the CKD-EPI Creatinine Equation (2021)    Anion gap 9 5 - 15    Comment: Performed at [facility] [address], [place], [place] [address]  Magnesium     Status: None   Collection Time: 12/19/20  5:33 AM  Result Value Ref Range   Magnesium 1.8 1.7 - 2.4 mg/dL    Comment: Performed at [facility] [address], [place], [place] [address]  Phosphorus     Status: None   Collection Time: 12/19/20  5:33 AM  Result Value Ref Range   Phosphorus 2.9 2.5 - 4.6 mg/dL    Comment: Performed at [facility] [address], [place], [place] [address]  CBC     Status: Abnormal   Collection Time:  12/19/20  5:33 AM  Result Value Ref Range   WBC 7.8 4.0 - 10.5 K/uL   RBC 3.62 (L) 4.22 - 5.81 MIL/uL   Hemoglobin 11.6 (L) 13.0 - 17.0 g/dL   HCT 36.5 (L) 39.0 - 52.0 %   MCV 100.8 (H) 80.0 - 100.0 fL  MCH 32.0 26.0 - 34.0 pg   MCHC 31.8 30.0 - 36.0 g/dL   RDW 16.0 (H) 11.5 - 15.5 %   Platelets 272 150 - 400 K/uL   nRBC 0.3 (H) 0.0 - 0.2 %    Comment: Performed at [facility] [address], [place], [place] [address]  Heparin level (unfractionated)     Status: Abnormal   Collection Time: 12/19/20  5:33 AM  Result Value Ref Range   Heparin Unfractionated 0.19 (L) 0.30 - 0.70 IU/mL    Comment: (NOTE) If heparin results are below expected values, and patient dosage has  been confirmed, suggest follow up testing of antithrombin III levels. Performed at [facility], [place] [address] [place], [place] [address]   Triglycerides     Status: None   Collection Time: 12/19/20  5:33 AM  Result Value Ref Range   Triglycerides 52 <150 mg/dL    Comment: Performed at [facility] [address], [place], [place] [address]  Glucose, capillary     Status: Abnormal   Collection Time: 12/19/20  7:31 AM  Result Value Ref Range   Glucose-Capillary 111 (H) 70 - 99 mg/dL    Comment: Glucose reference range applies only to samples taken after fasting for at least 8 hours.  Glucose, capillary     Status: None   Collection Time: 12/19/20 11:13 AM  Result Value Ref Range   Glucose-Capillary 88 70 - 99 mg/dL    Comment: Glucose reference range applies only to samples taken after fasting for at least 8 hours.  Magnesium     Status: None   Collection Time: 12/19/20  2:18 PM  Result Value Ref Range   Magnesium 1.9 1.7 - 2.4 mg/dL    Comment: Performed at [facility] [address], [place], [place] [address]  Phosphorus     Status: None   Collection Time: 12/19/20  2:18 PM  Result Value Ref Range   Phosphorus 3.4 2.5 - 4.6 mg/dL    Comment: Performed at [facility] [address], [name], [place] [address]  Heparin level (unfractionated)     Status: Abnormal   Collection Time: 12/19/20  2:18 PM  Result Value Ref Range   Heparin Unfractionated 0.22 (L) 0.30 - 0.70 IU/mL    Comment: (NOTE) If heparin results are below expected values, and patient dosage has  been confirmed, suggest follow up testing of antithrombin III levels. Performed at [facility], [place] [address], [place], [place] [address]   Glucose, capillary     Status: Abnormal   Collection Time: 12/19/20  3:00 PM  Result Value Ref Range   Glucose-Capillary 103 (H) 70 - 99 mg/dL    Comment: Glucose reference range applies only to samples taken after fasting for at least 8 hours.  Glucose, capillary     Status: Abnormal   Collection Time: 12/19/20  7:30 PM  Result Value Ref Range   Glucose-Capillary 121 (H) 70 - 99 mg/dL    Comment: Glucose reference range applies only to samples taken after fasting for at least 8 hours.  Heparin level (unfractionated)     Status: Abnormal   Collection Time: 12/19/20 10:07 PM  Result Value Ref Range   Heparin Unfractionated 0.24 (L) 0.30 - 0.70 IU/mL    Comment: (NOTE) If heparin results are below expected values, and patient dosage has  been confirmed, suggest follow up testing of antithrombin III levels. Performed  at [facility], [place] [address] [place], [place] [address]   Glucose, capillary     Status: Abnormal   Collection Time: 12/19/20 11:16 PM  Result Value Ref Range   Glucose-Capillary 139 (H) 70 - 99 mg/dL    Comment: Glucose reference range applies only to samples taken after fasting for at least 8 hours.  Glucose, capillary     Status: Abnormal   Collection Time: 12/20/20  3:06 AM  Result Value Ref Range   Glucose-Capillary 112 (H) 70 - 99 mg/dL    Comment: Glucose reference range applies only to samples taken after fasting for at least 8 hours.  CBC     Status: Abnormal   Collection Time: 12/20/20  5:27 AM   Result Value Ref Range   WBC 7.0 4.0 - 10.5 K/uL   RBC 3.32 (L) 4.22 - 5.81 MIL/uL   Hemoglobin 11.1 (L) 13.0 - 17.0 g/dL   HCT 32.8 (L) 39.0 - 52.0 %   MCV 98.8 80.0 - 100.0 fL   MCH 33.4 26.0 - 34.0 pg   MCHC 33.8 30.0 - 36.0 g/dL   RDW 16.3 (H) 11.5 - 15.5 %   Platelets 261 150 - 400 K/uL   nRBC 0.0 0.0 - 0.2 %    Comment: Performed at [facility] [address], [place], [place] [address]  Heparin level (unfractionated)     Status: None   Collection Time: 12/20/20  5:27 AM  Result Value Ref Range   Heparin Unfractionated 0.30 0.30 - 0.70 IU/mL    Comment: (NOTE) If heparin results are below expected values, and patient dosage has  been confirmed, suggest follow up testing of antithrombin III levels. Performed at [facility], [place] [address], [place], [place] [address]   Glucose, capillary     Status: Abnormal   Collection Time: 12/20/20  7:33 AM  Result Value Ref Range   Glucose-Capillary 112 (H) 70 - 99 mg/dL    Comment: Glucose reference range applies only to samples taken after fasting for at least 8 hours.  Basic metabolic panel     Status: Abnormal   Collection Time: 12/20/20  8:27 AM  Result Value Ref Range   Sodium 139 135 - 145 mmol/L   Potassium 4.0 3.5 - 5.1 mmol/L   Chloride 110 98 - 111 mmol/L   CO2 21 (L) 22 - 32 mmol/L   Glucose, Bld 148 (H) 70 - 99 mg/dL    Comment: Glucose reference range applies only to samples taken after fasting for at least 8 hours.   BUN 16 6 - 20 mg/dL   Creatinine, Ser 1.02 0.61 - 1.24 mg/dL   Calcium 7.9 (L) 8.9 - 10.3 mg/dL   GFR, Estimated >60 >60 mL/min    Comment: (NOTE) Calculated using the CKD-EPI Creatinine Equation (2021)    Anion gap 8 5 - 15    Comment: Performed at [facility] [address], [place],  [address]    ECG   N/A  Telemetry   Sinus rhythm - PVC's and bigeminy overnight  - Personally Reviewed  Radiology    EEG  Result Date: 12/19/2020 Lora Havens, MD      12/19/2020  3:00 PM Patient Name: Vester Balthazor MRN: [id number] Epilepsy Attending: Lora Havens Referring Physician/Provider: Dr Meredith Staggers Dagar Date: 12/19/2020 Duration: 25.09 mins  Patient history: unknown age male with ams, left gaze preference. EEG to evaluate for seizure.  Level of alertness:  comatose  AEDs during EEG study: Propofol  Technical aspects: This EEG study was done with scalp electrodes positioned according to the 10-20 International system of electrode placement. Electrical activity was acquired at a sampling rate of '500Hz'  and reviewed with a high frequency filter of '70Hz'  and a low frequency filter of '1Hz' . EEG data were recorded continuously and digitally stored.  Description: EEG showed continuous generalized 6 to 7 Hz theta slowing.  Right >left frontal spikes were also noted, maximal Hyperventilation and photic stimulation were not performed.    ABNORMALITY - Spikes, right> left frontal -Continuous slow, generalized IMPRESSION: This study showed evidence of epileptogenicity arising from right>left frontal region.  There is also moderate diffuse encephalopathy, nonspecific etiology but could be secondary to sedation. No seizures were seen throughout the recording.  Dr Leonie Man was notified  Lora Havens   CT HEAD WO CONTRAST  Result Date: 12/19/2020 CLINICAL DATA:  Stroke. EXAM: CT HEAD WITHOUT CONTRAST TECHNIQUE: Contiguous axial images were obtained from the base of the skull through the vertex without intravenous contrast. COMPARISON:  MRI head 12/17/2020.  CT head 12/17/2020 FINDINGS: Brain: Hypodensity right superior cerebellum consistent with acute infarct as noted on diffusion-weighted imaging. No hemorrhage. Chronic hemorrhage left inferior cerebellum unchanged for MRI. Patchy white matter hypodensity bilaterally unchanged from MRI and consistent with chronic microvascular ischemia. Ventricle size normal. No acute hemorrhage. Vascular: Negative for hyperdense vessel Skull:  Negative Sinuses/Orbits: Chronic fracture left medial orbit. No orbital lesion. Mild mucosal edema paranasal sinuses Other: None IMPRESSION: Acute infarct right superior cerebellum unchanged from MRI. No acute hemorrhage. Electronically Signed   By: Franchot Gallo M.D.   On: 12/19/2020 10:57    Cardiac Studies   N/A  Assessment   Active Problems:   Stroke [facility])   Basilar artery embolus   Acute on chronic combined systolic and diastolic CHF (congestive heart failure) ([place])   Plan   1. Cardiomyopathy:  - EF < 20% w/ mod LVH, grade 2 dd, RV function mod reduced - RA pressure 3, PAS not seen on echo report, MD to review - no CXR this admit, no obvious CHF exacerbation on exam, except for pedal edema - do not think he needs diuresis now - at this time, BP range 144/107-129/90  - he is on hydralazine 25 mg tid - on cored 3.125 mg BID - Needs diuresis - recommend lasix 40 mg IV BID  2. CAD:  - cardiac CT report above, pt w/ CAD but left AMA 04/26/2020 before cath performed, has not had PCI - med rx for CAD w/ ASA, BB, no statin as LFTs are abnl  Stroke and other issues, per Dr Leonie Man - no clear evidence for LV thrombus, although given low LVEF, not unreasonable for long-term anticoagulation with warfarin if would be compliant - continue IV Heparin for now.  Active Problems:   Stroke [facility])   Basilar artery embolus  Time Spent Directly with Patient:  I have spent a total of 25 minutes with the patient reviewing hospital notes, telemetry, EKGs, labs and examining the patient as well as establishing an assessment and plan that was discussed personally with the patient.  > 50% of time was spent in direct patient care.  Length of Stay:  LOS: 3 days   Pixie Casino, MD, [facility], [place] Director of the [facility] Lipid [facility] &  Cardiovascular Risk [facility] Clinic Diplomate of the American Board of Clinical Lipidology Attending Cardiologist  Direct Dial: [phone]   Fax: [phone]  Website:  [email or web address] [email or web address] Ruben Pyka 12/20/2020, 11:02 AM

## 2020-12-20 NOTE — Evaluation (Signed)
Occupational Therapy Evaluation Patient Details Name: [name] MRN: [id number] DOB: 26-Nov-1966 Today's Date: 12/20/2020    History of Present Illness Homeless 55 yo male found down with c/o CP by bystanders. 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 Extubated 1/20.   Clinical Impression   This 55 yo male admitted with above presents to acute OT with PLOF thought to have been independent--pt is homeless. He currently is Mod -Max A +2 for mobility and Min A-total A for basic ADLs. He will benefit from acute OT with follow up at SNF.    Follow Up Recommendations  SNF;Supervision/Assistance - 24 hour    Equipment Recommendations  Other (comment) (TBD next venue)       Precautions / Restrictions Precautions Precautions: Fall Precaution Comments: has been agressive at times Restrictions Weight Bearing Restrictions: No      Mobility Bed Mobility Overal bed mobility: Needs Assistance Bed Mobility: Rolling;Sidelying to Sit Rolling: Mod assist Sidelying to sit: Max assist            Transfers Overall transfer level: Needs assistance   Transfers: Sit to/from Stand Sit to Stand: Max assist;+2 physical assistance              Balance Overall balance assessment: Needs assistance Sitting-balance support: Bilateral upper extremity supported;Feet supported Sitting balance-Leahy Scale: Poor Sitting balance - Comments: anterior and left lateral lean   Standing balance support: Bilateral upper extremity supported Standing balance-Leahy Scale: Zero                             ADL either performed or assessed with clinical judgement   ADL Overall ADL's : Needs assistance/impaired Eating/Feeding: Minimal assistance;Bed level   Grooming: Moderate assistance;Bed level    Upper Body Bathing: Moderate assistance;Bed level   Lower Body Bathing: Total assistance;Bed level   Upper Body Dressing : Maximal assistance;Bed level   Lower Body Dressing: Total assistance;Bed level                       Vision   Vision Assessment?: Vision impaired- to be further tested in functional context Additional Comments: Right inattention            Pertinent Vitals/Pain Pain Assessment: No/denies pain Faces Pain Scale: Hurts a little bit Pain Intervention(s): Monitored during session     Hand Dominance Right   Extremity/Trunk Assessment Upper Extremity Assessment Upper Extremity Assessment: RUE deficits/detail RUE Deficits / Details: Decreased coordination and strength RUE Coordination: decreased fine motor;decreased gross motor           Communication Communication Communication: Other (comment) (voice gruffy post extubation)   Cognition Arousal/Alertness: Awake/alert Behavior During Therapy: Impulsive;Restless Overall Cognitive Status: No family/caregiver present to determine baseline cognitive functioning                                 General Comments: Able to tell us he was at [facility], but did not know year or month (shrugged his shoulders). When asked who he lived with or where he lived he said he did not want to talk about it.   General Comments  Right inattention            Home Living Family/patient  expects to be discharged to:: Shelter/Homeless                                        Prior Functioning/Environment          Comments: PLOF unknown but pt was homeless and thus feel he was independent        OT Problem List: Decreased strength;Decreased range of motion;Impaired balance (sitting and/or standing);Impaired vision/perception;Decreased coordination;Decreased cognition;Decreased safety awareness;Decreased knowledge of use of DME or AE;Impaired UE functional use      OT  Treatment/Interventions: Self-care/ADL training;DME and/or AE instruction;Patient/family education;Balance training;Visual/perceptual remediation/compensation;Therapeutic activities;Therapeutic exercise    OT Goals(Current goals can be found in the care plan section) Acute Rehab OT Goals Patient Stated Goal: to eat (perseverating on this--all day) OT Goal Formulation: With patient Time For Goal Achievement: 01/03/21 Potential to Achieve Goals: Good  OT Frequency: Min 2X/week           Co-evaluation PT/OT/SLP Co-Evaluation/Treatment: Yes Reason for Co-Treatment: Necessary to address cognition/behavior during functional activity;For patient/therapist safety PT goals addressed during session: Mobility/safety with mobility;Balance;Strengthening/ROM OT goals addressed during session: Strengthening/ROM;ADL's and self-care      AM-PAC OT "6 Clicks" Daily Activity     Outcome Measure Help from another person eating meals?: A Little (setup) Help from another person taking care of personal grooming?: A Lot Help from another person toileting, which includes using toliet, bedpan, or urinal?: Total Help from another person bathing (including washing, rinsing, drying)?: A Lot Help from another person to put on and taking off regular upper body clothing?: A Lot Help from another person to put on and taking off regular lower body clothing?: Total 6 Click Score: 11   End of Session Nurse Communication:  (RN in room whole time we were)  Activity Tolerance: Patient tolerated treatment well Patient left: in bed;with call bell/phone within reach;with bed alarm set;with nursing/sitter in room  OT Visit Diagnosis: Unsteadiness on feet (R26.81);Other abnormalities of gait and mobility (R26.89);Muscle weakness (generalized) (M62.81);Other symptoms and signs involving cognitive function;Hemiplegia and hemiparesis Hemiplegia - Right/Left: Right Hemiplegia - dominant/non-dominant: Dominant Hemiplegia -  caused by: Cerebral infarction                Time: 9798-9211 OT Time Calculation (min): 21 min Charges:  OT General Charges $OT Visit: 1 Visit OT Evaluation $OT Eval Moderate Complexity: 1 Mod  Ignacia Palma, OTR/L Acute [employer] Pager [phone] Office [phone]     Evette Georges 12/20/2020, 4:42 PM

## 2020-12-20 NOTE — Progress Notes (Signed)
SLP Cancellation Note  Patient Details Name: [name] MRN: [id number] DOB: Oct 30, 1966   Cancelled treatment:        Continues on vent and sedated. Will follow.    [name] 12/20/2020, 8:45 AM [name].Ed Nurse, children's [phone] Office [phone]

## 2020-12-20 NOTE — Progress Notes (Signed)
ANTICOAGULATION CONSULT NOTE  Pharmacy Consult for Heparin Indication:  Possible apical thrombus  Allergies  Allergen Reactions  . Ibuprofen Anaphylaxis  . Latex Other (See Comments)    Reaction??  . Peanut-Containing Drug Products Other (See Comments)    Allergic to peanut butter (reaction??)  . Tylenol [Acetaminophen] Hives and Nausea And Vomiting    Patient Measurements: Height: 6\' 1"  (185.4 cm) Weight: 95 kg (209 lb 7 oz) IBW/kg (Calculated) : 79.9 Heparin dosing weight = 95kg  Vital Signs: Temp: 97.8 F (36.6 C) (01/20 0400) Temp Source: Axillary (01/20 0400) BP: 95/60 (01/20 0600) Pulse Rate: 33 (01/20 0600)  Labs: Recent Labs    12/17/20 1310 12/17/20 1312 12/17/20 1558 12/17/20 1624 12/17/20 1800 12/18/20 0336 12/18/20 1034 12/18/20 2238 12/19/20 0533 12/19/20 1418 12/19/20 2207 12/20/20 0527  HGB 15.0 16.3  16.3  --    < >  --  13.6  --   --  11.6*  --   --  11.1*  HCT 45.4 48.0  48.0  --    < >  --  42.0  --   --  36.5*  --   --  32.8*  PLT 396  --   --   --   --  317  --   --  272  --   --  261  APTT 27  --   --   --   --   --   --   --   --   --   --   --   LABPROT 17.5*  --   --   --   --   --   --   --   --   --   --   --   INR 1.5*  --   --   --   --   --   --   --   --   --   --   --   HEPARINUNFRC  --   --   --   --   --   --   --    < > 0.19* 0.22* 0.24* 0.30  CREATININE 1.32* 1.10  1.10  --   --   --  1.31*  --   --  1.16  --   --   --   TROPONINIHS  --   --  46*  --  45*  --  42*  --   --   --   --   --    < > = values in this interval not displayed.    Estimated Creatinine Clearance: 84.2 mL/min (by C-G formula based on SCr of 1.16 mg/dL).   Assessment: 32 YOM with right superior cerebellar artery territory infarct to continue on IV heparin for possible apical thrombus.  Heparin level therapeutic and at the low end of normal.  No bleeding reported; CBC stable.  Goal of Therapy:  Heparin level 0.3-0.5 units/ml Monitor platelets by  anticoagulation protocol: Yes   Plan:  Increase heparin infusion slightly to 1700 units/hr to maintain therapeutic level Daily heparin level and CBC   Duaine Radin D. 46, PharmD, BCPS, BCCCP 12/20/2020, 7:30 AM

## 2020-12-20 NOTE — Progress Notes (Signed)
Pt became aggressive yelling that he wants food, although it has not been 4 hours post extubation and pt's voice is very hoarse.  Told him he cannot have anything and he demanded that I call the doctor to speak with him. [employer] paged.  Walked back in room and pt screamed "get out" over and over.

## 2020-12-20 NOTE — Evaluation (Signed)
Clinical/Bedside Swallow Evaluation Patient Details  Name: [name] MRN: [id number] Date of Birth: 1966-03-06  Today's Date: 12/20/2020 Time: SLP Start Time (ACUTE ONLY): 1457 SLP Stop Time (ACUTE ONLY): 1400 SLP Time Calculation (min) (ACUTE ONLY): 1383 min  Past Medical History: History reviewed. No pertinent past medical history. Past Surgical History:  Past Surgical History:  Procedure Laterality Date   IR ANGIO INTRA EXTRACRAN SEL COM CAROTID INNOMINATE BILAT MOD SED  12/17/2020   IR ANGIO VERTEBRAL SEL VERTEBRAL BILAT MOD SED  12/17/2020   RADIOLOGY WITH ANESTHESIA N/A 12/17/2020   Procedure: IR WITH ANESTHESIA;  Surgeon: Radiologist, Medication, MD;  Location: [facility] OR;  Service: Radiology;  Laterality: N/A;   HPI:  56 yr old [place] and became unresponsive with left-sided weakness and left gaze deviation. 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 Intubated 1/17-1/20.   Assessment / Plan / Recommendation Clinical Impression  Pt was cooperative during assessment needing mild encouragement to consume liquids, not just solids. Right facial weakness present. Vocal quality is hoarse and low intensity after 3 day intubation and cough moderate strength. He is highly impulsive needing close supervision. Throat clears present with solid and delayed cough after liquid trial following cracker. He needs full assist with feeding and supervision for strategies/safety with recommend regular texture, thin liquids, pills whole in puree. ST wil follow. SLP Visit Diagnosis: Dysphagia, unspecified (R13.10)    Aspiration Risk  Mild aspiration risk    Diet Recommendation Regular;Thin liquid   Liquid Administration via: Cup;Straw Medication Administration: Whole meds with  puree Supervision: Patient able to self feed;Staff to assist with self feeding;Full supervision/cueing for compensatory strategies Compensations: Minimize environmental distractions;Slow rate;Small sips/bites;Lingual sweep for clearance of pocketing Postural Changes: Seated upright at 90 degrees    Other  Recommendations Oral Care Recommendations: Oral care BID   Follow up Recommendations Skilled Nursing facility      Frequency and Duration min 2x/week  2 weeks       Prognosis Prognosis for Safe Diet Advancement: Good Barriers to Reach Goals: Cognitive deficits      Swallow Study   General HPI: 55 yr old [place] with [place] and became unresponsive with left-sided weakness and left gaze deviation. 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 Intubated 1/17-1/20. Type of Study: Bedside Swallow Evaluation Previous Swallow Assessment:  (none) Diet Prior to this Study: NPO Temperature Spikes Noted: No Respiratory Status: Nasal cannula History of Recent Intubation: Yes Length of Intubations (days): 3 days Date extubated: 12/20/20 Behavior/Cognition: Alert;Impulsive;Distractible;Requires cueing Oral Cavity Assessment: Within Functional Limits Oral Care Completed by SLP: Yes Oral Cavity - Dentition: Other (Comment);Missing dentition (upper and lower posterior) Vision: Functional for self-feeding Self-Feeding Abilities: Needs assist Patient Positioning: Upright in bed Baseline Vocal Quality: Hoarse;Low vocal intensity Volitional Cough: Weak    Oral/Motor/Sensory Function Overall Oral Motor/Sensory Function: Mild impairment Facial ROM: Reduced right;Suspected CN VII (facial) dysfunction Facial Symmetry: Abnormal symmetry right;Suspected CN VII (facial) dysfunction Facial Strength:  Reduced right;Suspected CN VII (facial) dysfunction Lingual Strength: Within Functional Limits   Ice Chips Ice chips: Not tested   Thin Liquid Thin Liquid: Impaired Presentation: Cup;Straw Pharyngeal  Phase Impairments: Cough -  Immediate    Nectar Thick Nectar Thick Liquid: Not tested   Honey Thick Honey Thick Liquid: Not tested   Puree Puree: Impaired Presentation: Spoon Oral Phase Impairments: Poor awareness of bolus Oral Phase Functional Implications:  (labial residue) Pharyngeal Phase Impairments:  (none)   Solid     Solid: Impaired Oral Phase Impairments: Reduced lingual movement/coordination      [name] 12/20/2020,4:28 PM [name] [name].Ed Nurse, children's [phone] Office [phone]

## 2020-12-20 NOTE — Procedures (Signed)
Extubation Procedure Note  Patient Details:   Name: [name] DOB: [birth year] MRN: [id number]   Airway Documentation:    Vent end date: 12/20/20 Vent end time: 1128   Evaluation  O2 sats: stable throughout Complications: No apparent complications Patient did tolerate procedure well. Bilateral Breath Sounds: Clear,Diminished   Yes   Patient was extubated to a 4L [place] without any complications, dyspnea or stridor noted. Positive cuff leak prior to extubation.   Jaritza Duignan, Margaretmary Dys 12/20/2020, 11:28 AM

## 2020-12-20 NOTE — Progress Notes (Addendum)
STROKE TEAM PROGRESS NOTE   INTERVAL HISTORY No acute overnight events.  Patient evaluated at bedside this morning, no family in the room.  Patient remains intubated, but alert and awake. , follows simple commands, was able to open his eyes, stick his tongue out and move all 4 extremities.  He  extubated after rounds successfully to  4 L [place] without any complication, dyspnea or stridor.  Repeat CT scan yesterday did not show any acute findings.  EEG is positive for spikes right more than left frontal, but no definite weakness seizures and on examination patient able to follow commands appropriately hence will defer anticonvulsants at the present time.  Continued on IV heparin drip at 1700 units/h to maintain therapeutic level, last heparin level 0.30 this morning.  Dr. Denese Killings is requested to take over this patient's care as from  stroke team point of view, patient is stable and he agreed to the request, appreciate the help.  Blood pressure well controlled.  Neurological exam improving.  Vitals:   12/20/20 1000 12/20/20 1100 12/20/20 1128 12/20/20 1200  BP: 101/72 105/71 103/89   Pulse: 60 62 82   Resp: 18 19 (!) 26   Temp:    98.1 F (36.7 C)  TempSrc:    Axillary  SpO2: 98% 100% 98%   Weight:      Height:       CBC:  Recent Labs  Lab 12/17/20 1310 12/17/20 1312 12/18/20 0336 12/19/20 0533 12/20/20 0527  WBC 9.8  --  11.8* 7.8 7.0  NEUTROABS 4.9  --  8.9*  --   --   HGB 15.0   < > 13.6 11.6* 11.1*  HCT 45.4   < > 42.0 36.5* 32.8*  MCV 98.9  --  100.0 100.8* 98.8  PLT 396  --  317 272 261   < > = values in this interval not displayed.   Basic Metabolic Panel:  Recent Labs  Lab 12/19/20 0533 12/19/20 1418 12/20/20 0827  NA 137  --  139  K 4.3  --  4.0  CL 108  --  110  CO2 20*  --  21*  GLUCOSE 107*  --  148*  BUN 16  --  16  CREATININE 1.16  --  1.02  CALCIUM 7.4*  --  7.9*  MG 1.8 1.9  --   PHOS 2.9 3.4  --    Lipid Panel:  Recent Labs  Lab 12/18/20 0823  12/19/20 0533  CHOL 116  --   TRIG 88 52  HDL 26*  --   CHOLHDL 4.5  --   VLDL 18  --   LDLCALC 72  --    HgbA1c: 6.4 Urine Drug Screen:  Recent Labs  Lab 12/17/20 1800  LABOPIA NONE DETECTED  COCAINSCRNUR POSITIVE*  LABBENZ NONE DETECTED  AMPHETMU NONE DETECTED  THCU NONE DETECTED  LABBARB NONE DETECTED    Alcohol Level  Recent Labs  Lab 12/17/20 1310  ETH <10    IMAGING past 24 hours  EEG  12/19/2020  EEG showed continuous generalized 6 to 7 Hz theta slowing.  Right >left frontal spikes were also noted, maximalHyperventilation and photic stimulation were not performed.   CT Head wo contrast 12/19/2020  IMPRESSION: Acute infarct right superior cerebellum unchanged from MRI.  CT head Code stroke wo contrast 12/17/2020  IMPRESSION: 1. Left cerebellar hypodensity concerning for acute/subacute infarct. 2. No intracranial hemorrhage. 3. ASPECTS is 10 4. Small right parietal scalp hematoma.  CT Angio  Head Neck w or wo contrast 12/17/2020  IMPRESSION: Non opacification of the left vertebral artery with retrograde filling of the distal left V4 segment.   Bilateral carotid bifurcation atheromatous disease with 50% right and 30% left carotid bulb narrowing.   Mild left P2 segment narrowing.   Sub optimal contrast bolus timing limits evaluation.  X-ray Abdomen 12/17/2020  IMPRESSION: Esophageal tube tip overlies the proximal stomach.   MRA Head 12/18/2020  IMPRESSION: MRI HEAD IMPRESSION:   1. 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. No associated mass effect. 2. Chronic left PICA distribution infarct. Abnormal flow void within the left V4 segment consistent with slow flow as seen on corresponding MRA. 3. Underlying mild-to-moderate chronic microvascular ischemic disease.   MRA HEAD IMPRESSION:   1. Acute occlusion of the right superior cerebellar artery. 2. Attenuated but patent flow within the left V4  segment, suggesting a proximal stenosis. 3. Otherwise wide patency of the major intracranial arterial circulation. No other hemodynamically significant or correctable Stenosis  Echocardiogram 12/18/2020  IMPRESSIONS     1. Left ventricular ejection fraction, by estimation, is <20%. The left  ventricle has severely decreased function. The left ventricle demonstrates  global hypokinesis. The left ventricular internal cavity size was  moderately dilated. There is moderate  left ventricular hypertrophy. Left ventricular diastolic parameters are  consistent with Grade II diastolic dysfunction (pseudonormalization).  Elevated left atrial pressure.   2. Right ventricular systolic function is moderately reduced. The right  ventricular size is mildly enlarged.   3. Left atrial size was moderately dilated.   4. Right atrial size was moderately dilated.   5. The mitral valve is normal in structure. Trivial mitral valve  regurgitation. No evidence of mitral stenosis.   6. The aortic valve is tricuspid. Aortic valve regurgitation is not  visualized. Mild aortic valve sclerosis is present, with no evidence of  aortic valve stenosis.   7. The inferior vena cava is normal in size with greater than 50%  respiratory variability, suggesting right atrial pressure of 3 mmHg.     Physical Exam  Constitutional: Appears well-developed and well-nourished.  Psych: Unable to assess. Unresponsive Eyes: No scleral injection HENT: No OP obstrucion Head: Normocephalic.  Cardiovascular: RRR on tele Respiratory: Effort normal.  GI: Soft.  Skin: WDI   Neuro: Mental Status: Patient is responsive to voice.  Drowsy but can arouse easily and follow commands. Alert, awake  Speech/Language: non verbal.    Cranial Nerves: II: Pupils are 48mm equal, round, and sluggish.  III,IV, VI:  Ptosis not present, normal horizontal and vertical gaze. VIII: hearing is intact  IX, X: + cough XI: head midline XII: pt  will not protrude tongue, but opens mouth Motor: Tone is flaccid BUE/BLEs. Bulk is normal.   Moves all extremities equally, against gravity.  Limited exam due to normal cooperation sensory: Intact Plantars: Toes are downgoing bilaterally. Cerebellar: No tremors.  Gait: deferred.   ASSESSMENT/PLAN [name] is a 55 y.o. male with a questionable PMHx of CHF and DM II per bystanders who presents to [facility] ED via EMS after chest pain complaint followed by unresponsiveness followed by unresponsiveness and vomiting and EMS noticed right-sided movements but left gaze deviation and left hemiparesis. CT scans showed subacute left cerebellar infarct with possible dense basilar artery sign but CT angiogram was of poor quality due to timing of dye injection hence patient was taken for emergent cerebral catheter angiogram and was intubated for airway protection. IV tPA considered but  not given because diagnosis of stroke was not certain given lack of significant focal findings except gaze deviation and CT scan showing a subacute cerebellar infarct patient unable to give clear history. No LVO on diagnostic angiogram. MRI shows acute ischemic nonhemorrhagic right superior cerebellar artery.   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  His neurological exam and mental status seem very out of proportion to his infarct and underlying encephalopathy from cocaine intoxication, global hypoperfusion and abnormal liver function contributing  code Stroke CT head; No intracranial hemorrhage  CTA head & neck: Non opacification of the left vertebral artery with retrograde filling of the distal left V4 segment.  MRI:  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   2D Echo: Left ventricular ejection fraction, by estimation, is <20%. The left  ventricle has severely decreased function.    LDL : 72  HgbA1c: 6.4  EEG: severe diffuse encephalopathy, nonspecific etiology but could be secondary to sedation, toxic-metabolic etiology, anoxic/hypoxic brain injury. No seizures or epileptiform discharges were seen throughout the recording.  VTE prophylaxis -none, on IV heparin drip    Diet   Diet NPO time specified    Unavailable  prior to admission, now on aspirin 81 mg daily.   Therapy recommendations: Pending  Disposition: Pending  Hypertension  Home meds: Coreg, clonidine, hydralazine, lisinopril  Stable . Permissive hypertension (OK if < 220/120) but gradually normalize in 5-7 days . Long-term BP goal normotensive  Hyperlipidemia  Home meds: Lipitor 40 mg  Start Lipitor 40 mg  LDL: 72  Continue statin at discharge  Diabetes type II   Home meds: None  HgbA1c: 6.4  CBGs Recent Labs    12/20/20 0306 12/20/20 0733 12/20/20 1120  GLUCAP 112* 112* 104*       Other Stroke Risk Factors  Substance abuse : Cocaine POSITIVE. Patient will be advised to stop using due to stroke risk.  Systolic and diastolic congestive heart failure  Other Active Problems  Acute hypoxic respiratory failure: Extubated today on 12/20/2020 and on 4 L [place]  Acute encephalopathy metabolic/toxic: supportive care, delirium precautions.  Systolic congestive heart failure: Echocardiogram suggestive of severe systolic dysfunction  Transaminitis: Trend LFTs, trend CMP, hepatitis panel negative.  Hypoglycemia: Monitor CBG with goal 140-180  Abnormal EEG showing bifrontal spikes but no definite witnessed seizures except tonic left eye gaze deviation Will hold off on anticonvulsants given patient's history of noncompliance Hospital day # 3    Patient is gradually improving and now able to follow commands.  Extubated as tolerated.  Hold off on anticonvulsants despite abnormal EEG as there is no definite witnessed seizures and patient's medication compliance history is suspect.   Discussed with Dr. Lynnell Catalan critical care medicine.  Request transfer to medical hospitalist service after extubation for management of his drug withdrawal.  We will transition IV heparin to oral antiplatelet agents when patient is able to swallow safely. This patient is critically ill and at significant risk of neurological worsening, death and care requires constant monitoring of vital signs, hemodynamics,respiratory and cardiac monitoring, extensive review of multiple databases, frequent neurological assessment, discussion with family, other specialists and medical decision making of high complexity.I have made any additions or clarifications directly to the above note.This critical care time does not reflect procedure time, or teaching time or supervisory time of PA/NP/Med Resident etc but could involve care discussion time.  I spent 30 minutes of neurocritical care time  in  the care of  this patient.      Delia Heady, MD Medical Director [facility] Pager: [phone] 12/20/2020 12:14 PM   To contact Stroke Continuity provider, please refer to [email or web address]. After hours, contact General Neurology

## 2020-12-20 NOTE — H&P (Signed)
Please see Consult Notes by Dr Merrily Pew dated 1/17.

## 2020-12-20 NOTE — Evaluation (Signed)
Physical Therapy Evaluation Patient Details Name: [name] MRN: [id number] DOB: Feb 03, 1966 Today's Date: 12/20/2020   History of Present Illness  Homeless 55 yo male found down with c/o CP by bystanders. 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 Extubated 1/20.  Clinical Impression  Pt admitted with/for new acute ischemic superior cerebellar infarct with some notable incoordination with need for moderate to maximal assist as of the evaluation..  Pt currently limited functionally due to the problems listed. ( See problems list.)   Pt will benefit from PT to maximize function and safety in order to get ready for next venue listed below.     Follow Up Recommendations SNF;Supervision/Assistance - 24 hour    Equipment Recommendations  Other (comment) (TBA)    Recommendations for Other Services       Precautions / Restrictions Precautions Precautions: Fall Precaution Comments: has been agressive at times Restrictions Weight Bearing Restrictions: No      Mobility  Bed Mobility Overal bed mobility: Needs Assistance Bed Mobility: Rolling;Sidelying to Sit Rolling: Mod assist Sidelying to sit: Max assist            Transfers Overall transfer level: Needs assistance   Transfers: Sit to/from Stand Sit to Stand: Max assist;+2 physical assistance         General transfer comment: pt biased heavily to the left with R LE not fully coordinating the stand with result of pt not attaining full upright.  Ambulation/Gait             General Gait Details: unable today  Stairs            Wheelchair Mobility    Modified Rankin (Stroke Patients Only) Modified Rankin (Stroke Patients Only) Pre-Morbid Rankin Score: No symptoms Modified Rankin: Severe disability     Balance  Overall balance assessment: Needs assistance Sitting-balance support: Bilateral upper extremity supported;Feet supported Sitting balance-Leahy Scale: Poor Sitting balance - Comments: anterior and left lateral lean   Standing balance support: Bilateral upper extremity supported Standing balance-Leahy Scale: Zero                               Pertinent Vitals/Pain Pain Assessment: No/denies pain Faces Pain Scale: Hurts a little bit Pain Intervention(s): Monitored during session    Home Living Family/patient expects to be discharged to:: Skilled nursing facility                      Prior Function Level of Independence:  (expected independent)         Comments: PLOF unknown but pt was homeless and thus feel he was independent     Hand Dominance   Dominant Hand: Right    Extremity/Trunk Assessment   Upper Extremity Assessment Upper Extremity Assessment: Defer to OT evaluation RUE Deficits / Details: Decreased coordination and strength RUE Coordination: decreased fine motor;decreased gross motor    Lower Extremity Assessment Lower Extremity Assessment: RLE deficits/detail;Generalized weakness RLE Deficits / Details: bil weakness with mild incoordination on the R RLE Coordination: decreased fine motor       Communication   Communication: Other (comment) (voice gruffy post extubation)  Cognition Arousal/Alertness: Awake/alert Behavior During Therapy: Impulsive;Restless Overall Cognitive Status: No family/caregiver present to determine baseline cognitive functioning  General Comments: Able to tell us he was at [facility], but did not know year or month (shrugged his shoulders). When asked who he lived with or where he lived he said he did not want to talk about it.      General Comments General comments (skin integrity, edema, etc.): Right inattention with decreased focus on task at hand.    Exercises      Assessment/Plan    PT Assessment Patient needs continued PT services  PT Problem List Decreased activity tolerance;Decreased balance;Decreased mobility;Decreased coordination;Decreased strength;Decreased knowledge of use of DME;Decreased safety awareness       PT Treatment Interventions DME instruction;Gait training;Functional mobility training;Therapeutic activities;Balance training;Patient/family education    PT Goals (Current goals can be found in the Care Plan section)  Acute Rehab PT Goals Patient Stated Goal: to eat (perseverating on this--all day) PT Goal Formulation: With patient Time For Goal Achievement: 01/03/21 Potential to Achieve Goals: Good    Frequency Min 3X/week   Barriers to discharge Decreased caregiver support      Co-evaluation PT/OT/SLP Co-Evaluation/Treatment: Yes Reason for Co-Treatment: Complexity of the patient's impairments (multi-system involvement) PT goals addressed during session: Mobility/safety with mobility OT goals addressed during session: Strengthening/ROM;ADL's and self-care       AM-PAC PT "6 Clicks" Mobility  Outcome Measure Help needed turning from your back to your side while in a flat bed without using bedrails?: A Lot Help needed moving from lying on your back to sitting on the side of a flat bed without using bedrails?: A Lot Help needed moving to and from a bed to a chair (including a wheelchair)?: A Lot Help needed standing up from a chair using your arms (e.g., wheelchair or bedside chair)?: A Lot Help needed to walk in hospital room?: A Lot Help needed climbing 3-5 steps with a railing? : A Lot 6 Click Score: 12    End of Session Equipment Utilized During Treatment: Oxygen Activity Tolerance: Patient tolerated treatment well;Other (comment) (limited by some agitation and decreased focus.) Patient left: in bed;with call bell/phone within reach;with nursing/sitter in room;with bed alarm set Nurse Communication: Mobility  status PT Visit Diagnosis: Unsteadiness on feet (R26.81);Other symptoms and signs involving the nervous system (R29.898);Other abnormalities of gait and mobility (R26.89)    Time: 4315-4008 PT Time Calculation (min) (ACUTE ONLY): 21 min   Charges:   PT Evaluation $PT Eval Moderate Complexity: 1 Mod          12/20/2020  Jacinto Halim., PT Acute Rehabilitation Services [phone]  (pager) [phone]  (office)  [name] 12/20/2020, 5:23 PM

## 2020-12-21 ENCOUNTER — Inpatient Hospital Stay (HOSPITAL_COMMUNITY): Payer: Medicaid Other

## 2020-12-21 DIAGNOSIS — I6300 Cerebral infarction due to thrombosis of unspecified precerebral artery: Secondary | ICD-10-CM | POA: Diagnosis not present

## 2020-12-21 DIAGNOSIS — J9601 Acute respiratory failure with hypoxia: Secondary | ICD-10-CM | POA: Diagnosis not present

## 2020-12-21 DIAGNOSIS — I5043 Acute on chronic combined systolic (congestive) and diastolic (congestive) heart failure: Secondary | ICD-10-CM | POA: Diagnosis not present

## 2020-12-21 DIAGNOSIS — I651 Occlusion and stenosis of basilar artery: Secondary | ICD-10-CM | POA: Diagnosis not present

## 2020-12-21 LAB — HEPARIN LEVEL (UNFRACTIONATED)
Heparin Unfractionated: 0.26 IU/mL — ABNORMAL LOW (ref 0.30–0.70)
Heparin Unfractionated: 0.34 IU/mL (ref 0.30–0.70)

## 2020-12-21 LAB — CBC
HCT: 36.9 % — ABNORMAL LOW (ref 39.0–52.0)
Hemoglobin: 12.5 g/dL — ABNORMAL LOW (ref 13.0–17.0)
MCH: 33.2 pg (ref 26.0–34.0)
MCHC: 33.9 g/dL (ref 30.0–36.0)
MCV: 98.1 fL (ref 80.0–100.0)
Platelets: 291 10*3/uL (ref 150–400)
RBC: 3.76 MIL/uL — ABNORMAL LOW (ref 4.22–5.81)
RDW: 16.2 % — ABNORMAL HIGH (ref 11.5–15.5)
WBC: 8.9 10*3/uL (ref 4.0–10.5)
nRBC: 0 % (ref 0.0–0.2)

## 2020-12-21 LAB — HEPATIC FUNCTION PANEL
ALT: 147 U/L — ABNORMAL HIGH (ref 0–44)
AST: 55 U/L — ABNORMAL HIGH (ref 15–41)
Albumin: 2.3 g/dL — ABNORMAL LOW (ref 3.5–5.0)
Alkaline Phosphatase: 91 U/L (ref 38–126)
Bilirubin, Direct: 0.2 mg/dL (ref 0.0–0.2)
Indirect Bilirubin: 0.5 mg/dL (ref 0.3–0.9)
Total Bilirubin: 0.7 mg/dL (ref 0.3–1.2)
Total Protein: 6.3 g/dL — ABNORMAL LOW (ref 6.5–8.1)

## 2020-12-21 IMAGING — DX DG CHEST 1V PORT
1 series · 1 of 1 positions shown · non-contrast
Comparison: [DATE].

CLINICAL DATA: Respiratory failure.

EXAM:
PORTABLE CHEST 1 VIEW

[chest ap]
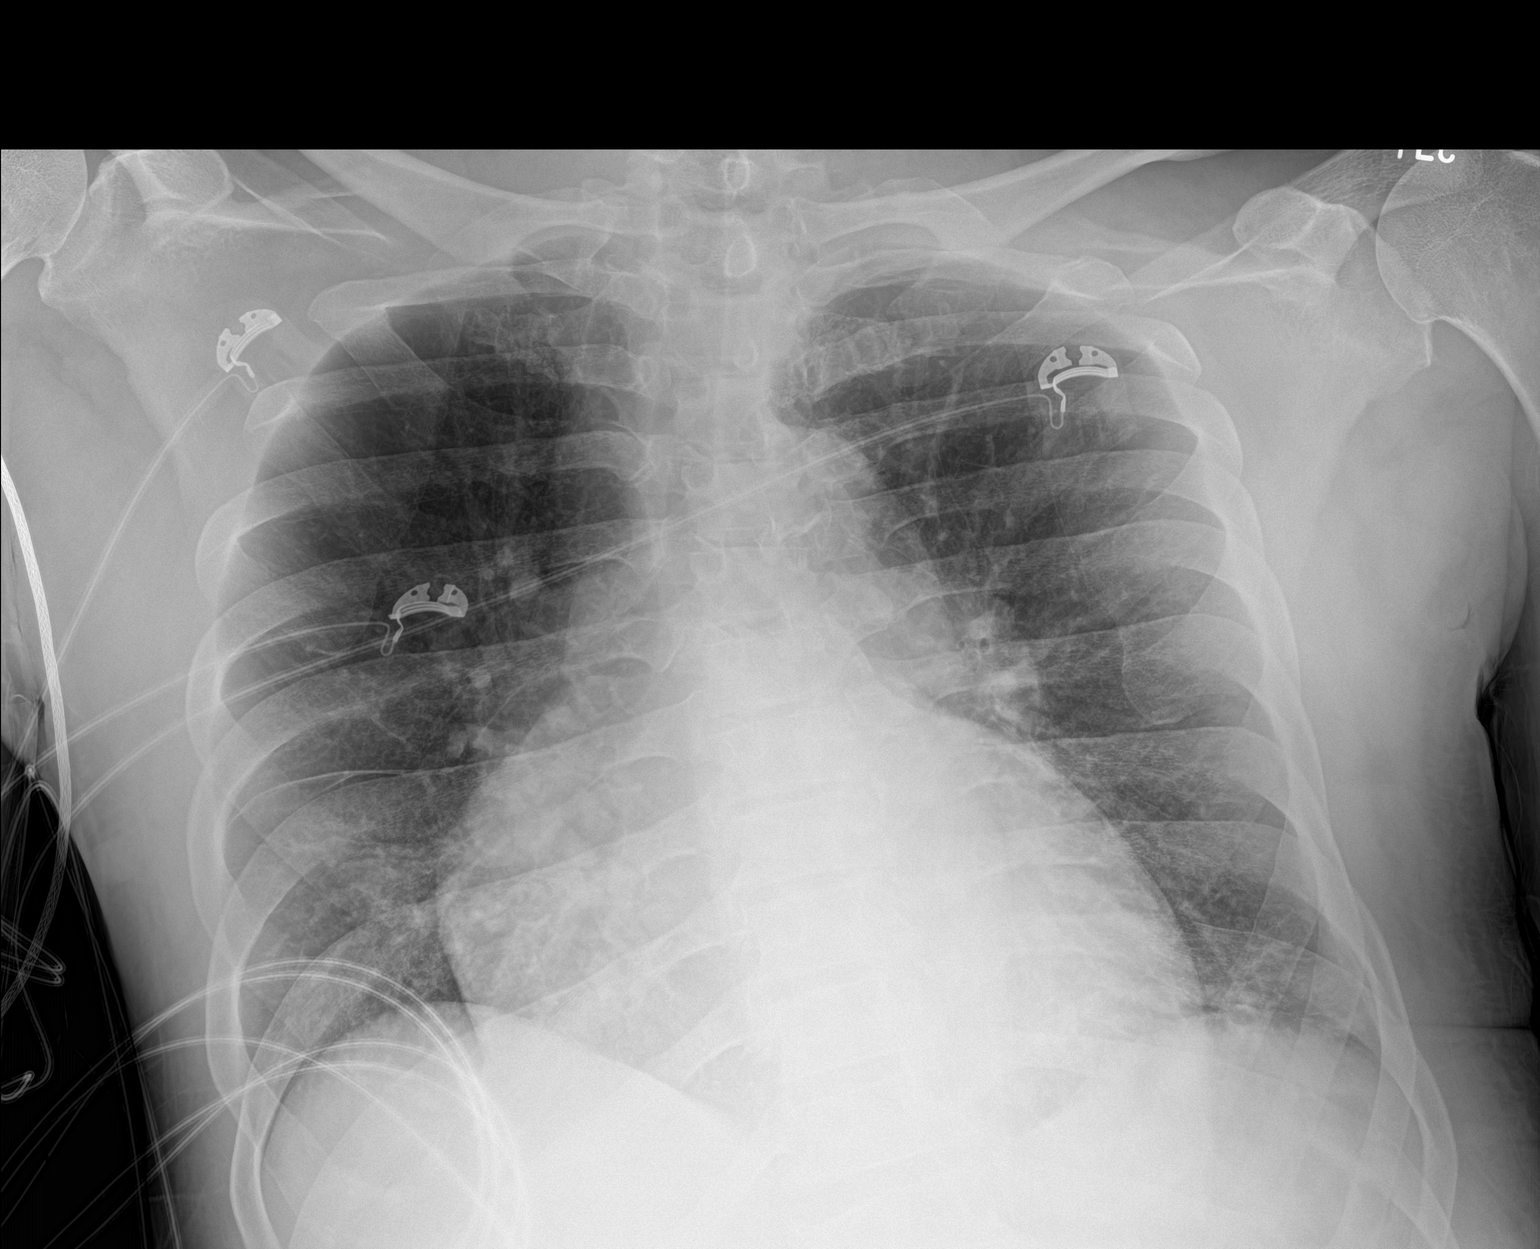

[1 of 1 positions shown; findings below may reference images not displayed]

FINDINGS: Stable cardiomegaly. No pneumothorax or pleural effusion is noted.
Minimal bibasilar subsegmental atelectasis is noted. Bony thorax is
unremarkable.
IMPRESSION: Minimal bibasilar subsegmental atelectasis.

## 2020-12-21 MED ORDER — CARVEDILOL 6.25 MG PO TABS
6.2500 mg | ORAL_TABLET | Freq: Two times a day (BID) | ORAL | Status: DC
  Administered 2020-12-21 – 2021-02-02 (×54): 6.25 mg via ORAL
  Filled 2020-12-21 (×71): qty 1

## 2020-12-21 MED ORDER — PROSOURCE PLUS PO LIQD
30.0000 mL | Freq: Two times a day (BID) | ORAL | Status: DC
  Administered 2020-12-21 – 2021-01-20 (×22): 30 mL via ORAL
  Filled 2020-12-21 (×46): qty 30

## 2020-12-21 MED ORDER — ENSURE ENLIVE PO LIQD
237.0000 mL | Freq: Two times a day (BID) | ORAL | Status: DC
  Administered 2020-12-21 – 2021-01-24 (×38): 237 mL via ORAL

## 2020-12-21 MED ORDER — DIVALPROEX SODIUM ER 500 MG PO TB24
500.0000 mg | ORAL_TABLET | Freq: Every day | ORAL | Status: DC
  Administered 2020-12-21 – 2021-01-23 (×24): 500 mg via ORAL
  Filled 2020-12-21 (×30): qty 1

## 2020-12-21 MED ORDER — CLOPIDOGREL BISULFATE 75 MG PO TABS
75.0000 mg | ORAL_TABLET | Freq: Every day | ORAL | Status: AC
  Administered 2020-12-21 – 2021-01-11 (×18): 75 mg via ORAL
  Filled 2020-12-21 (×20): qty 1

## 2020-12-21 MED ORDER — PHENOL 1.4 % MT LIQD
1.0000 | OROMUCOSAL | Status: DC | PRN
  Administered 2020-12-21 – 2021-01-08 (×2): 1 via OROMUCOSAL
  Filled 2020-12-21 (×2): qty 177

## 2020-12-21 MED ORDER — ATORVASTATIN CALCIUM 40 MG PO TABS
40.0000 mg | ORAL_TABLET | Freq: Every day | ORAL | Status: DC
  Administered 2020-12-21 – 2021-02-02 (×33): 40 mg via ORAL
  Filled 2020-12-21 (×39): qty 1

## 2020-12-21 MED ORDER — ADULT MULTIVITAMIN W/MINERALS CH
1.0000 | ORAL_TABLET | Freq: Every day | ORAL | Status: DC
  Administered 2020-12-21 – 2021-02-02 (×30): 1 via ORAL
  Filled 2020-12-21 (×38): qty 1

## 2020-12-21 MED ORDER — LORAZEPAM 1 MG PO TABS
1.0000 mg | ORAL_TABLET | ORAL | Status: DC | PRN
  Administered 2020-12-21 – 2021-02-02 (×37): 1 mg via ORAL
  Filled 2020-12-21 (×24): qty 1
  Filled 2020-12-21: qty 2
  Filled 2020-12-21 (×19): qty 1

## 2020-12-21 MED ORDER — ISOSORBIDE MONONITRATE ER 30 MG PO TB24
30.0000 mg | ORAL_TABLET | Freq: Every day | ORAL | Status: DC
  Administered 2020-12-21 – 2021-01-04 (×11): 30 mg via ORAL
  Filled 2020-12-21 (×13): qty 1

## 2020-12-21 NOTE — Progress Notes (Signed)
Nutrition Follow-up  RD working remotely.  DOCUMENTATION CODES:   Not applicable  INTERVENTION:   - Ensure Enlive po BID, each supplement provides 350 kcal and 20 grams of protein  - ProSource Plus 30 ml po BID, each supplement provides 100 kcal and 15 grams of protein  - MVI with minerals daily  - Encourage adequate PO intake  NUTRITION DIAGNOSIS:   Inadequate oral intake related to inability to eat as evidenced by NPO status.  Progressing, pt now on regular diet with thin liquids  GOAL:   Patient will meet greater than or equal to 90% of their needs  Progressing  MONITOR:   PO intake,Supplement acceptance,Weight trends,I & O's  REASON FOR ASSESSMENT:   Consult,Ventilator Enteral/tube feeding initiation and management  ASSESSMENT:   55 year old male admitted with acute ischemic nonhemorrhagic right superior cerebellar artery territory infarct. PMH of polysubstance abuse, CHF.  1/20 - extubated, diet advanced to regular with thin liquids  Unable to reach pt via phone call to room. Per RN and SLP notes, pt very eager to eat after extubation. However, no meal completions recorded in chart at this time. RD will order oral nutrition supplements to aid pt in meeting kcal and protein needs.  Per Cardiology note, pt reporting that he is SOB. Noted Palliative Care has been consulted regarding GOC.  Admit weight: 95.1 kg Current weight: 94.5 kg  Pt continues to have edema with RN documentation listing moderate pitting edema to BLE and mild pitting generalized edema. Pt remains on IV lasix.  Medications reviewed and include: IV lasix 40 mg BID, miralax, heparin  Labs reviewed. CBG's: 104-106 x 24 hours  UOP: 4075 ml x 24 hours I/O's: +4.5 L since admit  NUTRITION - FOCUSED PHYSICAL EXAM:  Unable to complete at this time. RD working remotely.  Diet Order:   Diet Order            Diet regular Room service appropriate? Yes; Fluid consistency: Thin  Diet  effective now                 EDUCATION NEEDS:   No education needs have been identified at this time  Skin:  Skin Assessment: Reviewed RN Assessment (R/L great toe wounds)  Last BM:  12/20/20 type 7  Height:   Ht Readings from Last 1 Encounters:  12/21/20 5\' 9"  (1.753 m)    Weight:   Wt Readings from Last 1 Encounters:  12/21/20 94.5 kg    BMI:  Body mass index is 30.77 kg/m.  Estimated Nutritional Needs:   Kcal:  2200-2400  Protein:  120-140 grams  Fluid:  >2 L/day    12/23/20, MS, RD, LDN Inpatient Clinical Dietitian Please see AMiON for contact information.

## 2020-12-21 NOTE — Progress Notes (Signed)
Pt tx from 4N17 to 2C15 after report given to receiving RN

## 2020-12-21 NOTE — Consult Note (Signed)
[facility] Face-to-Face Psychiatry Consult   Reason for Consult: Depression, bipolar, suicidal ideation Referring Physician: Dr. Jerral Ralph Patient Identification: [name] MRN:  [id number] Principal Diagnosis: <principal problem not specified> Diagnosis:  Active Problems:   Stroke [facility])   Basilar artery embolus   Acute on chronic combined systolic and diastolic CHF (congestive heart failure) (HCC)   Respiratory failure (HCC)   Total Time spent with patient: 30 minutes  Subjective:   [name] is a 55 y.o. male patient admitted with left cerebellar infarct, code stoke. [name] is a 55 y.o. male was seen and evaluated face-to-face.  He is awake, alert and oriented. patient presents irritable, flat and guarded.  Patient states "I am not suicidal.  Why are you here?  "  Denied any reports of suicidal or homicidal ideations.   Patient declined further assessment at this time, he was open to assistance with setting of his lunch tray.  Patient thanked this nurse practitioner prior to leaving the room. Support,  encouragement and reassurance was provided.    HPI:  55 year old gentleman, initially with unknown identity brought to the ER after he complained of chest pain and became unresponsive with left-sided weakness and left gaze deviation. Was intubated in the ER. Apparently seen by bystanders when patient complained of chest pain and all of a sudden he became unresponsive. 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 1/17-1/20, admitted to ICU, extubated to 4 L oxygen on 1/20. 1/21, transferred to [facility] service, progressive bed. Patient has different MRN in our hospital system, he has frequent hospitalization and was recently admitted and discharged a week ago with diabetic toe ulcer. Review his previous records indicate that he is homeless, he is  noncompliant, uses cocaine. He has a history of COPD, bipolar 1 disorder, known chronic heart failure with recent ejection fraction 40%, ischemic heart disease and previously declined PCI.  Past Psychiatric History: Depression, bipolar disorder, and substance abuse.  Patient was previously receiving behavioral health services from [facility], however has been difficult to follow as he is homeless, primarily noncompliant, and has been unable to follow-up due to multiple inpatient hospitalizations.  Patient denies any recent suicidal thoughts, gestures, and or ideations.  Patient endorses significant cocaine use disorder, to include day of admission.  Patient with multiple inpatient hospitalizations.   Risk to Self:  Denies Risk to Others:  Denies Prior Inpatient Therapy:  Multiple Prior Outpatient Therapy:  Previously followed by [facility], however has been difficult to treat and follow as he requires frequent inpatient medical admissions.  Past Medical History: History reviewed. No pertinent past medical history.  Past Surgical History:  Procedure Laterality Date  . IR ANGIO INTRA EXTRACRAN SEL COM CAROTID INNOMINATE BILAT MOD SED  12/17/2020  . IR ANGIO VERTEBRAL SEL VERTEBRAL BILAT MOD SED  12/17/2020  . RADIOLOGY WITH ANESTHESIA N/A 12/17/2020   Procedure: IR WITH ANESTHESIA;  Surgeon: Radiologist, Medication, MD;  Location: [facility] OR;  Service: Radiology;  Laterality: N/A;   Family History: No family history on file. Family Psychiatric  History: Mother with history of bipolar depression, and suicidality. Social History:  Social History   Substance and Sexual Activity  Alcohol Use None     Social History   Substance and Sexual Activity  Drug Use Not on file    Social History   Socioeconomic History  . Marital status: Single    Spouse name: Not  on file  . Number of children: Not on file  . Years of education: Not on file  . Highest education level: Not on file  Occupational History  .  Not on file  Tobacco Use  . Smoking status: Not on file  . Smokeless tobacco: Not on file  Substance and Sexual Activity  . Alcohol use: Not on file  . Drug use: Not on file  . Sexual activity: Not on file  Other Topics Concern  . Not on file  Social History Narrative  . Not on file   Social Determinants of Health   Financial Resource Strain: Not on file  Food Insecurity: Not on file  Transportation Needs: Not on file  Physical Activity: Not on file  Stress: Not on file  Social Connections: Not on file   Additional Social History:    Allergies:   Allergies  Allergen Reactions  . Ibuprofen Anaphylaxis  . Latex Other (See Comments)    Reaction??  . Peanut-Containing Drug Products Other (See Comments)    Allergic to peanut butter (reaction??)  . Tylenol [Acetaminophen] Hives and Nausea And Vomiting    Labs:  Results for orders placed or performed during the hospital encounter of 12/17/20 (from the past 48 hour(s))  Magnesium     Status: None   Collection Time: 12/19/20  2:18 PM  Result Value Ref Range   Magnesium 1.9 1.7 - 2.4 mg/dL    Comment: Performed at [facility], [address], [place], [name] [address]  Phosphorus     Status: None   Collection Time: 12/19/20  2:18 PM  Result Value Ref Range   Phosphorus 3.4 2.5 - 4.6 mg/dL    Comment: Performed at [facility], [address] [place], [name] [address]  Heparin level (unfractionated)     Status: Abnormal   Collection Time: 12/19/20  2:18 PM  Result Value Ref Range   Heparin Unfractionated 0.22 (L) 0.30 - 0.70 IU/mL    Comment: (NOTE) If heparin results are below expected values, and patient dosage has  been confirmed, suggest follow up testing of antithrombin III levels. Performed at [facility], [address] [place], [name] [address]   Glucose, capillary     Status: Abnormal   Collection Time: 12/19/20  3:00 PM  Result Value Ref Range   Glucose-Capillary 103 (H) 70 - 99  mg/dL    Comment: Glucose reference range applies only to samples taken after fasting for at least 8 hours.  Glucose, capillary     Status: Abnormal   Collection Time: 12/19/20  7:30 PM  Result Value Ref Range   Glucose-Capillary 121 (H) 70 - 99 mg/dL    Comment: Glucose reference range applies only to samples taken after fasting for at least 8 hours.  Heparin level (unfractionated)     Status: Abnormal   Collection Time: 12/19/20 10:07 PM  Result Value Ref Range   Heparin Unfractionated 0.24 (L) 0.30 - 0.70 IU/mL    Comment: (NOTE) If heparin results are below expected values, and patient dosage has  been confirmed, suggest follow up testing of antithrombin III levels. Performed at [facility], [address] [place], [name] [address]   Glucose, capillary     Status: Abnormal   Collection Time: 12/19/20 11:16 PM  Result Value Ref Range   Glucose-Capillary 139 (H) 70 - 99 mg/dL    Comment: Glucose reference range applies only to samples taken after fasting for  at least 8 hours.  Glucose, capillary     Status: Abnormal   Collection Time: 12/20/20  3:06 AM  Result Value Ref Range   Glucose-Capillary 112 (H) 70 - 99 mg/dL    Comment: Glucose reference range applies only to samples taken after fasting for at least 8 hours.  CBC     Status: Abnormal   Collection Time: 12/20/20  5:27 AM  Result Value Ref Range   WBC 7.0 4.0 - 10.5 K/uL   RBC 3.32 (L) 4.22 - 5.81 MIL/uL   Hemoglobin 11.1 (L) 13.0 - 17.0 g/dL   HCT 21.3 (L) 08.6 - 57.8 %   MCV 98.8 80.0 - 100.0 fL   MCH 33.4 26.0 - 34.0 pg   MCHC 33.8 30.0 - 36.0 g/dL   RDW 46.9 (H) 62.9 - 52.8 %   Platelets 261 150 - 400 K/uL   nRBC 0.0 0.0 - 0.2 %    Comment: Performed at [facility], [address] [place], [name] [address]  Heparin level (unfractionated)     Status: None   Collection Time: 12/20/20  5:27 AM  Result Value Ref Range   Heparin Unfractionated 0.30 0.30 - 0.70 IU/mL    Comment: (NOTE) If heparin  results are below expected values, and patient dosage has  been confirmed, suggest follow up testing of antithrombin III levels. Performed at [facility], [address], [place], [name] [address]   Glucose, capillary     Status: Abnormal   Collection Time: 12/20/20  7:33 AM  Result Value Ref Range   Glucose-Capillary 112 (H) 70 - 99 mg/dL    Comment: Glucose reference range applies only to samples taken after fasting for at least 8 hours.  Basic metabolic panel     Status: Abnormal   Collection Time: 12/20/20  8:27 AM  Result Value Ref Range   Sodium 139 135 - 145 mmol/L   Potassium 4.0 3.5 - 5.1 mmol/L   Chloride 110 98 - 111 mmol/L   CO2 21 (L) 22 - 32 mmol/L   Glucose, Bld 148 (H) 70 - 99 mg/dL    Comment: Glucose reference range applies only to samples taken after fasting for at least 8 hours.   BUN 16 6 - 20 mg/dL   Creatinine, Ser 7.25 0.61 - 1.24 mg/dL   Calcium 7.9 (L) 8.9 - 10.3 mg/dL   GFR, Estimated >36 >64 mL/min    Comment: (NOTE) Calculated using the CKD-EPI Creatinine Equation (2021)    Anion gap 8 5 - 15    Comment: Performed at [facility], [address], [place], [name] [address]  Glucose, capillary     Status: Abnormal   Collection Time: 12/20/20 11:20 AM  Result Value Ref Range   Glucose-Capillary 104 (H) 70 - 99 mg/dL    Comment: Glucose reference range applies only to samples taken after fasting for at least 8 hours.  Glucose, capillary     Status: Abnormal   Collection Time: 12/20/20  3:22 PM  Result Value Ref Range   Glucose-Capillary 106 (H) 70 - 99 mg/dL    Comment: Glucose reference range applies only to samples taken after fasting for at least 8 hours.  CBC     Status: Abnormal   Collection Time: 12/21/20  1:00 AM  Result Value Ref Range   WBC 8.9 4.0 - 10.5 K/uL   RBC 3.76 (L) 4.22 - 5.81 MIL/uL   Hemoglobin 12.5 (L) 13.0 -  17.0 g/dL   HCT 16.136.9 (L) 09.639.0 - 04.552.0 %   MCV 98.1 80.0 - 100.0 fL   MCH 33.2 26.0 - 34.0 pg   MCHC  33.9 30.0 - 36.0 g/dL   RDW 40.916.2 (H) 81.111.5 - 91.415.5 %   Platelets 291 150 - 400 K/uL   nRBC 0.0 0.0 - 0.2 %    Comment: Performed at [facility], [address] [place], [place] [address]  Heparin level (unfractionated)     Status: Abnormal   Collection Time: 12/21/20  1:00 AM  Result Value Ref Range   Heparin Unfractionated 0.26 (L) 0.30 - 0.70 IU/mL    Comment: (NOTE) If heparin results are below expected values, and patient dosage has  been confirmed, suggest follow up testing of antithrombin III levels. Performed at [facility], [address] [place], [place] [address]   Heparin level (unfractionated)     Status: None   Collection Time: 12/21/20  8:29 AM  Result Value Ref Range   Heparin Unfractionated 0.34 0.30 - 0.70 IU/mL    Comment: (NOTE) If heparin results are below expected values, and patient dosage has  been confirmed, suggest follow up testing of antithrombin III levels. Performed at [facility], [address] [place], [place] [address]   Hepatic function panel     Status: Abnormal   Collection Time: 12/21/20  8:29 AM  Result Value Ref Range   Total Protein 6.3 (L) 6.5 - 8.1 g/dL   Albumin 2.3 (L) 3.5 - 5.0 g/dL   AST 55 (H) 15 - 41 U/L   ALT 147 (H) 0 - 44 U/L   Alkaline Phosphatase 91 38 - 126 U/L   Total Bilirubin 0.7 0.3 - 1.2 mg/dL   Bilirubin, Direct 0.2 0.0 - 0.2 mg/dL   Indirect Bilirubin 0.5 0.3 - 0.9 mg/dL    Comment: Performed at [facility], [address] [place], [place] [address]    Current Facility-Administered Medications  Medication Dose Route Frequency Provider Last Rate Last Admin  . (feeding supplement) PROSource Plus liquid 30 mL  30 mL Oral BID BM Dorcas CarrowGhimire, Kuber, MD   30 mL at 12/21/20 1203  . acetaminophen (TYLENOL) tablet 650 mg  650 mg Oral Q4H PRN Deveshwar, Simonne MaffucciSanjeev, MD       Or  . acetaminophen (TYLENOL) 160 MG/5ML solution 650 mg  650 mg Per Tube Q4H PRN Deveshwar, Sanjeev, MD       Or  .  acetaminophen (TYLENOL) suppository 650 mg  650 mg Rectal Q4H PRN Deveshwar, Sanjeev, MD      . aspirin chewable tablet 81 mg  81 mg Oral Daily Agarwala, Daleen Boavi, MD   81 mg at 12/21/20 0913  . carvedilol (COREG) tablet 6.25 mg  6.25 mg Oral BID WC Chrystie NoseHilty, Kenneth C, MD   6.25 mg at 12/21/20 0913  . Chlorhexidine Gluconate Cloth 2 % PADS 6 each  6 each Topical Daily Rica Moteollins, Hunter J, MD   6 each at 12/20/20 2142  . clopidogrel (PLAVIX) tablet 75 mg  75 mg Oral Daily Dagar, Anjali, MD   75 mg at 12/21/20 1203  . divalproex (DEPAKOTE ER) 24 hr tablet 500 mg  500 mg Oral Daily Dagar, Anjali, MD   500 mg at 12/21/20 1202  . docusate (COLACE) 50 MG/5ML liquid 100 mg  100 mg Oral Daily PRN Lynnell CatalanAgarwala, Ravi, MD      . feeding supplement (ENSURE ENLIVE / ENSURE PLUS) liquid  237 mL  237 mL Oral BID BM Dorcas Carrow, MD   237 mL at 12/21/20 1203  . furosemide (LASIX) injection 40 mg  40 mg Intravenous BID Lynnell Catalan, MD   40 mg at 12/21/20 0913  . hydrALAZINE (APRESOLINE) tablet 25 mg  25 mg Per Tube TID Lynnell Catalan, MD   25 mg at 12/21/20 0913  . isosorbide mononitrate (IMDUR) 24 hr tablet 30 mg  30 mg Oral Daily Chrystie Nose, MD   30 mg at 12/21/20 0913  . LORazepam (ATIVAN) tablet 1 mg  1 mg Oral Q4H PRN Dorcas Carrow, MD   1 mg at 12/21/20 0913  . multivitamin with minerals tablet 1 tablet  1 tablet Oral Daily Dorcas Carrow, MD   1 tablet at 12/21/20 1203  . ondansetron (ZOFRAN) injection 4 mg  4 mg Intravenous Q6H PRN Kirby-Graham, Beather Arbour, NP      . phenol (CHLORASEPTIC) mouth spray 1 spray  1 spray Mouth/Throat PRN Zierle-Ghosh, Asia B, DO   1 spray at 12/21/20 0214  . polyethylene glycol (MIRALAX / GLYCOLAX) packet 17 g  17 g Per Tube Daily Raymon Mutton F, NP   17 g at 12/19/20 0917  . sacubitril-valsartan (ENTRESTO) 24-26 mg per tablet  1 tablet Oral BID Lynnell Catalan, MD   1 tablet at 12/21/20 0913  . silver sulfADIAZINE (SILVADENE) 1 % cream   Topical BID Micki Riley, MD   1  application at 12/20/20 2142    Musculoskeletal: Strength & Muscle Tone: within normal limits Gait & Station: normal Patient leans: N/A  Psychiatric Specialty Exam: Physical Exam  Review of Systems  Blood pressure (!) 144/90, pulse 98, temperature 98.1 F (36.7 C), temperature source Oral, resp. rate (!) 23, height 5\' 9"  (1.753 m), weight 94.5 kg, SpO2 90 %.Body mass index is 30.77 kg/m.  General Appearance: Disheveled and ill appearing  Eye Contact:  Fair  Speech:  Garbled and Slow  Volume:  Normal  Mood:  Irritable  Affect:  Blunt and Congruent  Thought Process:  Linear and Descriptions of Associations: Tangential  Orientation:  Full (Time, Place, and Person)  Thought Content:  Logical  Suicidal Thoughts:  No  Homicidal Thoughts:  No  Memory:  Immediate;   Fair Recent;   Poor  Judgement:  NA  Insight:  Fair  Psychomotor Activity:  NA  Concentration:  Concentration: Fair and Attention Span: Fair  Recall:  Poor  Fund of Knowledge:  Fair  Language:  Fair  Akathisia:  No  Handed:  Right  AIMS (if indicated):     Assets:  Desire for Improvement Resilience  ADL's:  Intact  Cognition:  WNL  Sleep:      [name] is a 55 year old male who presented with left cerebellar infarct, with significant history for bipolar disorder, and cocaine use disorder.  Patient is very well-known to our service, and at baseline is irritable, agitated, and blunt.  Although he denies on this evaluation patient does have intermittent suicidal ideations that are chronic in nature, although there is no documented history of an actual suicide attempt in his chart.  As previously noted patient is homeless, noncompliant, and continues to use illicit substances that are impacting his overall health, as a result palliative care has been consulted to establish goals of care.  Patient although has underlying psychiatric conditions that are impacting his health, at this time his medical conditions do take  priority at this time.  Patient denies suicidal ideations, homicidal  ideations, and or auditory visual hallucinations.  Patient is encouraged to be polite and less irritable with hospital staff, in which he verbalizes understanding.  Patient does not meet criteria for inpatient psychiatric hospitalization at this time.   Treatment Plan Summary: Plan We will resume patient's home medication.  -Recommend social work referral to [facility] for medication management, and assistance with outpatient resources for behavioral health.  If patient is interested after medically stable and appropriate for discharge, consider referral to substance abuse residential treatment facility if he agrees. -Depakote ER 500 mg p.o. daily has been resumed. -As noted patient at baseline is irritable, increased agitation, however it is unsafe at this time to initiate any additional psychotropic medication.  Patient with EF of 20%, prolonged QTC of 515.  Disposition: No evidence of imminent risk to self or others at present.   Patient does not meet criteria for psychiatric inpatient admission. Supportive therapy provided about ongoing stressors. Discussed crisis plan, support from social network, calling 911, coming to the Emergency Department, and calling Suicide Hotline.  Maryagnes Amos, FNP 12/21/2020 12:29 PM

## 2020-12-21 NOTE — Progress Notes (Addendum)
DAILY PROGRESS NOTE   Patient Name: [name] Date of Encounter: 12/21/2020 Cardiologist: Rollene Rotunda, MD  Chief Complaint   Short of breath  Patient Profile   [name] is a 55 y.o. male with no known history who is being seen today for the evaluation of cardiomyopathy at the request of Dr Pearlean Brownie.  Subjective   Extubated yesterday - given lasix and was 2L negative yesterday - still 4.5L positive recorded since admit. LVEF 20%. Remains tachycardic. Says he is short of breath today - a little increased WOB, but sats in the upper 90's on room air.  Objective   Vitals:   12/21/20 0057 12/21/20 0100 12/21/20 0425 12/21/20 0717  BP: (!) 148/103  128/77 (!) 144/90  Pulse: 94  87 98  Resp: (!) 25  (!) 24 (!) 23  Temp: 98.2 F (36.8 C)  98.1 F (36.7 C)   TempSrc: Axillary  Oral   SpO2: 96%  94% 90%  Weight:  94.5 kg 94.5 kg   Height:  5\' 9"  (1.753 m)      Intake/Output Summary (Last 24 hours) at 12/21/2020 12/23/2020 Last data filed at 12/21/2020 0425 Gross per 24 hour  Intake 1640.6 ml  Output 4075 ml  Net -2434.4 ml   Filed Weights   12/20/20 0500 12/21/20 0100 12/21/20 0425  Weight: 95 kg 94.5 kg 94.5 kg    Physical Exam   General appearance: alert and no distress Neck: JVD - a few cm above sternal notch, no carotid bruit and thyroid not enlarged, symmetric, no tenderness/mass/nodules Lungs: diminished breath sounds bibasilar Heart: regular rate and rhythm Abdomen: soft, non-tender; bowel sounds normal; no masses,  no organomegaly Extremities: extremities normal, atraumatic, no cyanosis or edema Pulses: 2+ and symmetric Skin: Skin color, texture, turgor normal. No rashes or lesions Neurologic: Grossly normal Psych: pleasant  Inpatient Medications    Scheduled Meds:  aspirin  81 mg Oral Daily   Chlorhexidine Gluconate Cloth  6 each Topical Daily   furosemide  40 mg Intravenous BID   hydrALAZINE  25 mg Per Tube TID   LORazepam  1 mg Intravenous  Once   LORazepam  1 mg Intravenous Once   nitroGLYCERIN  0.4 mg Transdermal Daily   polyethylene glycol  17 g Per Tube Daily   sacubitril-valsartan  1 tablet Oral BID   silver sulfADIAZINE   Topical BID    Continuous Infusions:  heparin 1,700 Units/hr (12/21/20 0453)    PRN Meds: acetaminophen **OR** acetaminophen (TYLENOL) oral liquid 160 mg/5 mL **OR** acetaminophen, docusate, ondansetron (ZOFRAN) IV, phenol   Labs   Results for orders placed or performed during the hospital encounter of 12/17/20 (from the past 48 hour(s))  Glucose, capillary     Status: None   Collection Time: 12/19/20 11:13 AM  Result Value Ref Range   Glucose-Capillary 88 70 - 99 mg/dL    Comment: Glucose reference range applies only to samples taken after fasting for at least 8 hours.  Magnesium     Status: None   Collection Time: 12/19/20  2:18 PM  Result Value Ref Range   Magnesium 1.9 1.7 - 2.4 mg/dL    Comment: Performed at [facility] Lab, [address], [place], [name]  Phosphorus     Status: None   Collection Time: 12/19/20  2:18 PM  Result Value Ref Range   Phosphorus 3.4 2.5 - 4.6 mg/dL    Comment: Performed at [facility], [address]  [place], [name] [address]  Heparin level (unfractionated)     Status: Abnormal   Collection Time: 12/19/20  2:18 PM  Result Value Ref Range   Heparin Unfractionated 0.22 (L) 0.30 - 0.70 IU/mL    Comment: (NOTE) If heparin results are below expected values, and patient dosage has  been confirmed, suggest follow up testing of antithrombin III levels. Performed at [facility], [address] [place], [name] [address]   Glucose, capillary     Status: Abnormal   Collection Time: 12/19/20  3:00 PM  Result Value Ref Range   Glucose-Capillary 103 (H) 70 - 99 mg/dL    Comment: Glucose reference range applies only to samples taken after fasting for at least 8 hours.  Glucose, capillary     Status: Abnormal   Collection  Time: 12/19/20  7:30 PM  Result Value Ref Range   Glucose-Capillary 121 (H) 70 - 99 mg/dL    Comment: Glucose reference range applies only to samples taken after fasting for at least 8 hours.  Heparin level (unfractionated)     Status: Abnormal   Collection Time: 12/19/20 10:07 PM  Result Value Ref Range   Heparin Unfractionated 0.24 (L) 0.30 - 0.70 IU/mL    Comment: (NOTE) If heparin results are below expected values, and patient dosage has  been confirmed, suggest follow up testing of antithrombin III levels. Performed at [facility] Lab, [address], [place], [name] [address]   Glucose, capillary     Status: Abnormal   Collection Time: 12/19/20 11:16 PM  Result Value Ref Range   Glucose-Capillary 139 (H) 70 - 99 mg/dL    Comment: Glucose reference range applies only to samples taken after fasting for at least 8 hours.  Glucose, capillary     Status: Abnormal   Collection Time: 12/20/20  3:06 AM  Result Value Ref Range   Glucose-Capillary 112 (H) 70 - 99 mg/dL    Comment: Glucose reference range applies only to samples taken after fasting for at least 8 hours.  CBC     Status: Abnormal   Collection Time: 12/20/20  5:27 AM  Result Value Ref Range   WBC 7.0 4.0 - 10.5 K/uL   RBC 3.32 (L) 4.22 - 5.81 MIL/uL   Hemoglobin 11.1 (L) 13.0 - 17.0 g/dL   HCT 73.2 (L) 20.2 - 54.2 %   MCV 98.8 80.0 - 100.0 fL   MCH 33.4 26.0 - 34.0 pg   MCHC 33.8 30.0 - 36.0 g/dL   RDW 70.6 (H) 23.7 - 62.8 %   Platelets 261 150 - 400 K/uL   nRBC 0.0 0.0 - 0.2 %    Comment: Performed at [facility] Lab, [address] [place], [name] [address]  Heparin level (unfractionated)     Status: None   Collection Time: 12/20/20  5:27 AM  Result Value Ref Range   Heparin Unfractionated 0.30 0.30 - 0.70 IU/mL    Comment: (NOTE) If heparin results are below expected values, and patient dosage has  been confirmed, suggest follow up testing of antithrombin III levels. Performed at [facility]  Lab, [address], [place], [name] [address]   Glucose, capillary     Status: Abnormal   Collection Time: 12/20/20  7:33 AM  Result Value Ref Range   Glucose-Capillary 112 (H) 70 - 99 mg/dL    Comment: Glucose reference range applies only to samples taken after fasting for at least 8 hours.  Basic metabolic panel  Status: Abnormal   Collection Time: 12/20/20  8:27 AM  Result Value Ref Range   Sodium 139 135 - 145 mmol/L   Potassium 4.0 3.5 - 5.1 mmol/L   Chloride 110 98 - 111 mmol/L   CO2 21 (L) 22 - 32 mmol/L   Glucose, Bld 148 (H) 70 - 99 mg/dL    Comment: Glucose reference range applies only to samples taken after fasting for at least 8 hours.   BUN 16 6 - 20 mg/dL   Creatinine, Ser 5.36 0.61 - 1.24 mg/dL   Calcium 7.9 (L) 8.9 - 10.3 mg/dL   GFR, Estimated >14 >43 mL/min    Comment: (NOTE) Calculated using the CKD-EPI Creatinine Equation (2021)    Anion gap 8 5 - 15    Comment: Performed at [facility] Lab, [address] [place], [name] [address]  Glucose, capillary     Status: Abnormal   Collection Time: 12/20/20 11:20 AM  Result Value Ref Range   Glucose-Capillary 104 (H) 70 - 99 mg/dL    Comment: Glucose reference range applies only to samples taken after fasting for at least 8 hours.  Glucose, capillary     Status: Abnormal   Collection Time: 12/20/20  3:22 PM  Result Value Ref Range   Glucose-Capillary 106 (H) 70 - 99 mg/dL    Comment: Glucose reference range applies only to samples taken after fasting for at least 8 hours.  CBC     Status: Abnormal   Collection Time: 12/21/20  1:00 AM  Result Value Ref Range   WBC 8.9 4.0 - 10.5 K/uL   RBC 3.76 (L) 4.22 - 5.81 MIL/uL   Hemoglobin 12.5 (L) 13.0 - 17.0 g/dL   HCT 86.7 (L) 61.9 - 50.9 %   MCV 98.1 80.0 - 100.0 fL   MCH 33.2 26.0 - 34.0 pg   MCHC 33.9 30.0 - 36.0 g/dL   RDW 32.6 (H) 71.2 - 45.8 %   Platelets 291 150 - 400 K/uL   nRBC 0.0 0.0 - 0.2 %    Comment: Performed at [name] Lab, [address] [place], [name] [address]  Heparin level (unfractionated)     Status: Abnormal   Collection Time: 12/21/20  1:00 AM  Result Value Ref Range   Heparin Unfractionated 0.26 (L) 0.30 - 0.70 IU/mL    Comment: (NOTE) If heparin results are below expected values, and patient dosage has  been confirmed, suggest follow up testing of antithrombin III levels. Performed at [facility] Lab, [address], [place], [name] [address]     ECG   N/A  Telemetry   Sinus rhythm - PVC's and bigeminy overnight  - Personally Reviewed  Radiology    EEG  Result Date: 12/19/2020 Charlsie Quest, MD     12/19/2020  3:00 PM Patient Name: [name] MRN: [id number] Epilepsy Attending: Charlsie Quest Referring Physician/Provider: Dr Geralynn Rile Dagar Date: 12/19/2020 Duration: 25.09 mins  Patient history: unknown age male with ams, left gaze preference. EEG to evaluate for seizure.  Level of alertness: comatose  AEDs during EEG study: Propofol  Technical aspects: This EEG study was done with scalp electrodes positioned according to the 10-20 International system of electrode placement. Electrical activity was acquired at a sampling rate of 500Hz  and reviewed with a high frequency filter of 70Hz  and a low frequency filter of 1Hz . EEG data were recorded continuously and digitally stored.  Description: EEG showed continuous generalized 6 to 7  Hz theta slowing.  Right >left frontal spikes were also noted, maximal Hyperventilation and photic stimulation were not performed.    ABNORMALITY - Spikes, right> left frontal -Continuous slow, generalized IMPRESSION: This study showed evidence of epileptogenicity arising from right>left frontal region.  There is also moderate diffuse encephalopathy, nonspecific etiology but could be secondary to sedation. No seizures were seen throughout the recording.  Dr Pearlean Brownie was notified  Charlsie Quest   CT HEAD WO CONTRAST  Result Date: 12/19/2020 CLINICAL DATA:  Stroke.  EXAM: CT HEAD WITHOUT CONTRAST TECHNIQUE: Contiguous axial images were obtained from the base of the skull through the vertex without intravenous contrast. COMPARISON:  MRI head 12/17/2020.  CT head 12/17/2020 FINDINGS: Brain: Hypodensity right superior cerebellum consistent with acute infarct as noted on diffusion-weighted imaging. No hemorrhage. Chronic hemorrhage left inferior cerebellum unchanged for MRI. Patchy white matter hypodensity bilaterally unchanged from MRI and consistent with chronic microvascular ischemia. Ventricle size normal. No acute hemorrhage. Vascular: Negative for hyperdense vessel Skull: Negative Sinuses/Orbits: Chronic fracture left medial orbit. No orbital lesion. Mild mucosal edema paranasal sinuses Other: None IMPRESSION: Acute infarct right superior cerebellum unchanged from MRI. No acute hemorrhage. Electronically Signed   By: Marlan Palau M.D.   On: 12/19/2020 10:57    Cardiac Studies   N/A  Assessment   Active Problems:   Stroke [facility])   Basilar artery embolus   Acute on chronic combined systolic and diastolic CHF (congestive heart failure) (HCC)   Respiratory failure (HCC)   Plan   1. Cardiomyopathy:  - EF < 20% w/ mod LVH, grade 2 dd, RV function mod reduced - RA pressure 3, PAS not seen on echo report, MD to review - no CXR this admit, no obvious CHF exacerbation on exam, except for pedal edema - do not think he needs diuresis now - at this time, BP range 144/107-129/90  - he is on hydralazine 25 mg tid -Restart coreg at 6.25 mg BID (apparently held for bradycardia, but on precedex) - has remained tachycardic in the 110-120 range overnight and is significantly hypertensive - Needs diuresis - continue lasix 40 mg IV BID -Started on Entresto 24/26 mg BID  2. CAD:  - cardiac CT report above, pt w/ CAD but left AMA 04/26/2020 before cath performed, has not had PCI - med rx for CAD w/ ASA, BB, no statin as LFTs are abnl - can d/c nitro patch, start  imdur 30 mg daily  Stroke and other issues, per Dr Pearlean Brownie - no clear evidence for LV thrombus, although given low LVEF, not unreasonable for long-term anticoagulation with warfarin if would be compliant - continue IV Heparin for now.  Active Problems:   Stroke [facility])   Basilar artery embolus  Time Spent Directly with Patient:  I have spent a total of 35 minutes with the patient reviewing hospital notes, telemetry, EKGs, labs and examining the patient as well as establishing an assessment and plan that was discussed personally with the patient.  > 50% of time was spent in direct patient care.  Length of Stay:  LOS: 4 days   Chrystie Nose, MD, [facility], FACP  [place]   [facility]  Medical Director of the [facility] Lipid Disorders &  Cardiovascular Risk Reduction Clinic Diplomate of the American Board of Clinical Lipidology Attending Cardiologist  Direct Dial: [phone]   Fax: [phone]  Website:  [email or web address] Nicely [name] 12/21/2020, 8:42 AM

## 2020-12-21 NOTE — Evaluation (Signed)
Speech Language Pathology Evaluation Patient Details Name: [name] MRN: [id number] DOB: 08-Sep-1966 Today's Date: 12/21/2020 Time: 3299-2426 SLP Time Calculation (min) (ACUTE ONLY): 14 min  Problem List:  Patient Active Problem List   Diagnosis Date Noted  . Acute on chronic combined systolic and diastolic CHF (congestive heart failure) (HCC) 12/20/2020  . Respiratory failure (HCC)   . Stroke (HCC) 12/17/2020  . 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 No pertinent past medical history. Past Surgical History:  Past Surgical History:  Procedure Laterality Date  . IR ANGIO INTRA EXTRACRAN SEL COM CAROTID INNOMINATE BILAT MOD SED  12/17/2020  . IR ANGIO VERTEBRAL SEL VERTEBRAL BILAT MOD SED  12/17/2020  . RADIOLOGY WITH ANESTHESIA N/A 12/17/2020   Procedure: IR WITH ANESTHESIA;  Surgeon: Radiologist, Medication, MD;  Location: [facility] OR;  Service: Radiology;  Laterality: N/A;   HPI:  55 yr old [place] and became unresponsive with left-sided weakness and left gaze deviation. 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 Intubated 1/17-1/20.   Assessment / Plan / Recommendation Clinical Impression  Pt had limited participation in cognitive evaluation, only intermittently following commands but question how much of this may have been volitional. His speech is dysarthric, characterized by imprecise articulation as well as vocal quality changes that may be more post-extubation in nature. His intelligibility is impacted at the word and short-phrase level. When pt was participating in a task he wanted to perform, such as self-feeding, he sustained attention well. He was oriented to person and location, but only partially oriented to time. He did  however demonstrate simple problem solving by utilizing the calendar in his room with Mod I. Pt will benefit from ongoing SLP services for differential diagnosis of cognitive abilities.     SLP Assessment  SLP Recommendation/Assessment: Patient needs continued Speech Lanaguage Pathology Services SLP Visit Diagnosis: Cognitive communication deficit (R41.841)    Follow Up Recommendations  Skilled Nursing facility    Frequency and Duration min 2x/week  2 weeks      SLP Evaluation Cognition  Overall Cognitive Status: No family/caregiver present to determine baseline cognitive functioning Arousal/Alertness: Awake/alert Orientation Level: Oriented to person;Oriented to place;Disoriented to time Attention: Sustained Sustained Attention: Appears intact (during basic, functional tasks) Awareness: Impaired Awareness Impairment: Emergent impairment Safety/Judgment: Impaired       Comprehension  Auditory Comprehension Overall Auditory Comprehension: Impaired Commands: Impaired One Step Basic Commands: Other (comment) (intermittently following - question if he is selectively following)    Expression Expression Primary Mode of Expression: Verbal Verbal Expression Overall Verbal Expression: Appears within functional limits for tasks assessed   Oral / Motor  Motor Speech Overall Motor Speech: Impaired Respiration: Impaired Level of Impairment: Phrase Phonation: Low vocal intensity;Other (comment) (rough) Resonance: Within functional limits Articulation: Impaired Level of Impairment: Phrase Intelligibility: Intelligibility reduced Word: 50-74% accurate Phrase: 50-74% accurate   GO          Functional Assessment Tool Used: No           Mahala Menghini., M.A. CCC-SLP Acute Rehabilitation Services Pager [phone] Office [phone]  12/21/2020, 4:35 PM

## 2020-12-21 NOTE — Plan of Care (Signed)
  Problem: Education: Goal: Knowledge of General Education information will improve Description: Including pain rating scale, medication(s)/side effects and non-pharmacologic comfort measures Outcome: Progressing   Problem: Clinical Measurements: Goal: Ability to maintain clinical measurements within normal limits will improve Outcome: Progressing   Problem: Activity: Goal: Risk for activity intolerance will decrease Outcome: Progressing   Problem: Nutrition: Goal: Adequate nutrition will be maintained Outcome: Progressing   Problem: Coping: Goal: Level of anxiety will decrease Outcome: Progressing   Problem: Pain Managment: Goal: General experience of comfort will improve Outcome: Progressing   Problem: Activity: Goal: Ability to tolerate increased activity will improve Outcome: Progressing   Problem: Role Relationship: Goal: Method of communication will improve Outcome: Progressing   Problem: Education: Goal: Knowledge of disease or condition will improve Outcome: Progressing   Problem: Coping: Goal: Will verbalize positive feelings about self Outcome: Progressing   Problem: Coping: Goal: Will identify appropriate support needs Outcome: Progressing   Problem: Nutrition: Goal: Dietary intake will improve Outcome: Progressing   Problem: Self-Care: Goal: Ability to communicate needs accurately will improve Outcome: Progressing

## 2020-12-21 NOTE — Progress Notes (Signed)
  Speech Language Pathology Treatment: Dysphagia  Patient Details Name: [name] MRN: [id number] DOB: 04-15-1966 Today's Date: 12/21/2020 Time: 9798-9211 SLP Time Calculation (min) (ACUTE ONLY): 14 min  Assessment / Plan / Recommendation Clinical Impression  Pt consumed large bites of solids with only one cough noted following his third bite. He consumed thin liquids with no s/s aspiration. Pt consumed PO trials in a partially reclined position because he would not sit more upright despite encouragement from SLP. Noted that the pt appeared to be SOB but RN reported that pt has been that way all day. Recommend continuing pt's current diet of regular solids with thin liquids and will f/u to monitor progress.    HPI HPI: 55 yr old admitted [place] with chest pain and became unresponsive with left-sided weakness and left gaze deviation. 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 Intubated 1/17-1/20.      SLP Plan  Continue with current plan of care       Recommendations  Diet recommendations: Regular;Thin liquid Liquids provided via: Cup;Straw Medication Administration: Whole meds with puree Supervision: Patient able to self feed;Staff to assist with self feeding Compensations: Minimize environmental distractions;Slow rate;Small sips/bites;Lingual sweep for clearance of pocketing Postural Changes and/or Swallow Maneuvers: Seated upright 90 degrees                Oral Care Recommendations: Oral care BID Follow up Recommendations: Skilled Nursing facility SLP Visit Diagnosis: Dysphagia, unspecified (R13.10) Plan: Continue with current plan of care       GO           Functional Assessment Tool Used: No   Zettie Cooley., SLP Student 12/21/2020, 4:20 PM

## 2020-12-21 NOTE — Progress Notes (Signed)
Patient refusing CBG checks.  

## 2020-12-21 NOTE — Plan of Care (Signed)
  Problem: Clinical Measurements: Goal: Diagnostic test results will improve Outcome: Progressing   Problem: Activity: Goal: Risk for activity intolerance will decrease Outcome: Progressing   Problem: Nutrition: Goal: Adequate nutrition will be maintained Outcome: Progressing   Problem: Pain Managment: Goal: General experience of comfort will improve Outcome: Progressing   Problem: Activity: Goal: Ability to tolerate increased activity will improve Outcome: Progressing   Problem: Role Relationship: Goal: Method of communication will improve Outcome: Progressing   Problem: Self-Care: Goal: Ability to communicate needs accurately will improve Outcome: Progressing   Problem: Nutrition: Goal: Dietary intake will improve Outcome: Progressing

## 2020-12-21 NOTE — Progress Notes (Signed)
SLP Cancellation Note  Patient Details Name: [name] MRN: [id number] DOB: October 25, 1966   Cancelled treatment:       Reason Eval/Treat Not Completed: Medical issues which prohibited therapy. TN just administered ativan and requests that we return at a later time. NT says pt has no overt difficulties with breakfast meal. Will f/u as able.    Mahala Menghini., M.A. CCC-SLP Acute Rehabilitation Services Pager [phone] Office [phone]  12/21/2020, 9:52 AM

## 2020-12-21 NOTE — Progress Notes (Signed)
ANTICOAGULATION CONSULT NOTE  Pharmacy Consult for Heparin Indication:  Possible apical thrombus  Allergies  Allergen Reactions  . Ibuprofen Anaphylaxis  . Latex Other (See Comments)    Reaction??  . Peanut-Containing Drug Products Other (See Comments)    Allergic to peanut butter (reaction??)  . Tylenol [Acetaminophen] Hives and Nausea And Vomiting    Patient Measurements: Height: 5\' 9"  (175.3 cm) Weight: 94.5 kg (208 lb 5.4 oz) IBW/kg (Calculated) : 70.7 Heparin dosing weight = 95kg  Vital Signs: Temp: 98.1 F (36.7 C) (01/21 0425) Temp Source: Oral (01/21 0425) BP: 144/90 (01/21 0717) Pulse Rate: 98 (01/21 0717)  Labs: Recent Labs    12/18/20 1034 12/18/20 2238 12/19/20 0533 12/19/20 1418 12/20/20 0527 12/20/20 0827 12/21/20 0100 12/21/20 0829  HGB  --    < > 11.6*  --  11.1*  --  12.5*  --   HCT  --   --  36.5*  --  32.8*  --  36.9*  --   PLT  --   --  272  --  261  --  291  --   HEPARINUNFRC  --    < > 0.19*   < > 0.30  --  0.26* 0.34  CREATININE  --   --  1.16  --   --  1.02  --   --   TROPONINIHS 42*  --   --   --   --   --   --   --    < > = values in this interval not displayed.    Estimated Creatinine Clearance: 96.1 mL/min (by C-G formula based on SCr of 1.02 mg/dL).   Assessment: 58 YOM with right superior cerebellar artery territory infarct to continue on IV heparin for possible apical thrombus.   Heparin level therapeutic this morning at 0.34, CBC stable.  Goal of Therapy:  Heparin level 0.3-0.5 units/ml Monitor platelets by anticoagulation protocol: Yes   Plan:  Continue heparin 1700 units/h Daily heparin level and CBC   44, PharmD, [place], [place] Clinical Pharmacist [phone] Please check AMION for all [facility] Pharmacy numbers 12/21/2020

## 2020-12-21 NOTE — Progress Notes (Addendum)
STROKE TEAM PROGRESS NOTE   INTERVAL HISTORY No acute overnight events.  Patient evaluated at bedside this morning, no family in the room present.  Patient states he is having headache and would like some medication for it.  He notes he cannot move his right arm but able to move all other extremities well.  He was asked to point to the switch for light and he moved his right arm to points toward it but kept on stating he cannot move his right arm.  Difficult to engage patient but has improved significantly on neurological exam.  Restarted his Depakote for mood stabilization and headache.  Also, discontinued his IV heparin, added Plavix 75 mg to aspirin 81 mg for 3 weeks and then he can continue aspirin 81 mg only.  Blood pressure well controlled.  Stable from stroke team point of view and we will sign off on this patient.  Vitals:   12/21/20 0057 12/21/20 0100 12/21/20 0425 12/21/20 0717  BP: (!) 148/103  128/77 (!) 144/90  Pulse: 94  87 98  Resp: (!) 25  (!) 24 (!) 23  Temp: 98.2 F (36.8 C)  98.1 F (36.7 C)   TempSrc: Axillary  Oral   SpO2: 96%  94% 90%  Weight:  94.5 kg 94.5 kg   Height:  5\' 9"  (1.753 m)     CBC:  Recent Labs  Lab 12/17/20 1310 12/17/20 1312 12/18/20 0336 12/19/20 0533 12/20/20 0527 12/21/20 0100  WBC 9.8  --  11.8*   < > 7.0 8.9  NEUTROABS 4.9  --  8.9*  --   --   --   HGB 15.0   < > 13.6   < > 11.1* 12.5*  HCT 45.4   < > 42.0   < > 32.8* 36.9*  MCV 98.9  --  100.0   < > 98.8 98.1  PLT 396  --  317   < > 261 291   < > = values in this interval not displayed.   Basic Metabolic Panel:  Recent Labs  Lab 12/19/20 0533 12/19/20 1418 12/20/20 0827  NA 137  --  139  K 4.3  --  4.0  CL 108  --  110  CO2 20*  --  21*  GLUCOSE 107*  --  148*  BUN 16  --  16  CREATININE 1.16  --  1.02  CALCIUM 7.4*  --  7.9*  MG 1.8 1.9  --   PHOS 2.9 3.4  --    Lipid Panel:  Recent Labs  Lab 12/18/20 0823 12/19/20 0533  CHOL 116  --   TRIG 88 52  HDL 26*  --    CHOLHDL 4.5  --   VLDL 18  --   LDLCALC 72  --    HgbA1c: 6.4 Urine Drug Screen:  Recent Labs  Lab 12/17/20 1800  LABOPIA NONE DETECTED  COCAINSCRNUR POSITIVE*  LABBENZ NONE DETECTED  AMPHETMU NONE DETECTED  THCU NONE DETECTED  LABBARB NONE DETECTED    Alcohol Level  Recent Labs  Lab 12/17/20 1310  ETH <10    IMAGING past 24 hours  EEG  12/19/2020  EEG showed continuous generalized 6 to 7 Hz theta slowing.  Right >left frontal spikes were also noted, maximalHyperventilation and photic stimulation were not performed.   CT Head wo contrast 12/19/2020  IMPRESSION: Acute infarct right superior cerebellum unchanged from MRI.  CT head Code stroke wo contrast 12/17/2020  IMPRESSION: 1. Left cerebellar hypodensity concerning  for acute/subacute infarct. 2. No intracranial hemorrhage. 3. ASPECTS is 10 4. Small right parietal scalp hematoma.  CT Angio Head Neck w or wo contrast 12/17/2020  IMPRESSION: Non opacification of the left vertebral artery with retrograde filling of the distal left V4 segment.   Bilateral carotid bifurcation atheromatous disease with 50% right and 30% left carotid bulb narrowing.   Mild left P2 segment narrowing.   Sub optimal contrast bolus timing limits evaluation.  X-ray Abdomen 12/17/2020  IMPRESSION: Esophageal tube tip overlies the proximal stomach.   MRA Head 12/18/2020  IMPRESSION: MRI HEAD IMPRESSION:   1. 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. No associated mass effect. 2. Chronic left PICA distribution infarct. Abnormal flow void within the left V4 segment consistent with slow flow as seen on corresponding MRA. 3. Underlying mild-to-moderate chronic microvascular ischemic disease.   MRA HEAD IMPRESSION:   1. Acute occlusion of the right superior cerebellar artery. 2. Attenuated but patent flow within the left V4 segment, suggesting a proximal stenosis. 3. Otherwise  wide patency of the major intracranial arterial circulation. No other hemodynamically significant or correctable Stenosis  Echocardiogram 12/18/2020  IMPRESSIONS     1. Left ventricular ejection fraction, by estimation, is <20%. The left  ventricle has severely decreased function. The left ventricle demonstrates  global hypokinesis. The left ventricular internal cavity size was  moderately dilated. There is moderate  left ventricular hypertrophy. Left ventricular diastolic parameters are  consistent with Grade II diastolic dysfunction (pseudonormalization).  Elevated left atrial pressure.   2. Right ventricular systolic function is moderately reduced. The right  ventricular size is mildly enlarged.   3. Left atrial size was moderately dilated.   4. Right atrial size was moderately dilated.   5. The mitral valve is normal in structure. Trivial mitral valve  regurgitation. No evidence of mitral stenosis.   6. The aortic valve is tricuspid. Aortic valve regurgitation is not  visualized. Mild aortic valve sclerosis is present, with no evidence of  aortic valve stenosis.   7. The inferior vena cava is normal in size with greater than 50%  respiratory variability, suggesting right atrial pressure of 3 mmHg.     Physical Exam  Constitutional: Appears well-developed and well-nourished.  Psych: Unable to assess. Unresponsive Eyes: No scleral injection HENT: No OP obstrucion Head: Normocephalic.  Cardiovascular: RRR on tele Respiratory: Effort normal.  GI: Soft.  Skin: WDI   Neuro: Mental Status: Patient is responsive to voice.  Drowsy but can arouse easily and follow commands. Alert, awake  Speech/Language: non verbal.    Cranial Nerves: II: Pupils are 39mm equal, round, and sluggish.  III,IV, VI:  Ptosis not present, normal horizontal and vertical gaze. VIII: hearing is intact  IX, X: + cough XI: head midline XII: pt will not protrude tongue, but opens mouth Motor: Tone  is flaccid BUE/BLEs. Bulk is normal.   Moves all extremities equally, against gravity.  Limited exam due to lack of cooperation.  Moves right upper extremity less than the left but he is lying on the right side with the shoulder within his body limiting testing. sensory: Intact Plantars: Toes are downgoing bilaterally. Cerebellar: No tremors.  Gait: deferred.   ASSESSMENT/PLAN Mr. Maximilliano Kersh is a 55 y.o. male with a questionable PMHx of CHF and DM II per bystanders who presents to [facility] ED via EMS after chest pain complaint followed by unresponsiveness followed by unresponsiveness and vomiting and EMS noticed right-sided movements but left gaze deviation and left  hemiparesis. CT scans showed subacute left cerebellar infarct with possible dense basilar artery sign but CT angiogram was of poor quality due to timing of dye injection hence patient was taken for emergent cerebral catheter angiogram and was intubated for airway protection. IV tPA considered but not given because diagnosis of stroke was not certain given lack of significant focal findings except gaze deviation and CT scan showing a subacute cerebellar infarct patient unable to give clear history. No LVO on diagnostic angiogram. MRI shows acute ischemic nonhemorrhagic right superior cerebellar artery.   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  His neurological exam and mental status seem very out of proportion to his infarct and underlying encephalopathy from cocaine intoxication, global hypoperfusion and abnormal liver function contributing  code Stroke CT head; No intracranial hemorrhage  CTA head & neck: Non opacification of the left vertebral artery with retrograde filling of the distal left V4 segment.  MRI:  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   2D Echo: Left ventricular ejection fraction,  by estimation, is <20%. The left  ventricle has severely decreased function.   LDL : 72  HgbA1c: 6.4  EEG: severe diffuse encephalopathy, nonspecific etiology but could be secondary to sedation, toxic-metabolic etiology, anoxic/hypoxic brain injury. No seizures or epileptiform discharges were seen throughout the recording.  VTE prophylaxis -none, on IV heparin drip    Diet   Diet regular Room service appropriate? Yes; Fluid consistency: Thin    Unavailable  prior to admission, now on aspirin 81 mg daily.   Therapy recommendations:SNF  Disposition: Pending  Hypertension  Home meds: Coreg, clonidine, hydralazine, lisinopril  Stable . Permissive hypertension (OK if < 220/120) but gradually normalize in 5-7 days . Long-term BP goal normotensive  Hyperlipidemia  Home meds: Lipitor 40 mg  C/w Lipitor 40 mg  LDL: 72  Continue statin at discharge  Diabetes type II   Home meds: None  HgbA1c: 6.4  CBGs Recent Labs    12/20/20 0733 12/20/20 1120 12/20/20 1522  GLUCAP 112* 104* 106*       Other Stroke Risk Factors  Substance abuse : Cocaine POSITIVE. Patient will be advised to stop using due to stroke risk.  Systolic and diastolic congestive heart failure  Other Active Problems  Acute hypoxic respiratory failure: Extubated today on 12/20/2020 and on 4 L Stem  Acute encephalopathy metabolic/toxic: supportive care, delirium precautions.  Systolic congestive heart failure: Echocardiogram suggestive of severe systolic dysfunction  Transaminitis: Trend LFTs, trend CMP, hepatitis panel negative.  Hypoglycemia: Monitor CBG with goal 140-180  Abnormal EEG showing bifrontal spikes but no definite witnessed seizures except tonic left eye gaze deviation Will hold off on anticonvulsants given patient's history of noncompliance Hospital day # 4    Patient continues to improve every day his neurological exam is mostly nonfocal.  Recommend discontinue IV heparin and use  aspirin and Plavix for 3 weeks followed by aspirin alone.  Patient has no insurance and has not been compliant with his medications and is not a good long-term anticoagulation candidate.  Continue ongoing medical management as per medical team.  Greater than 50% time during this 25-minute visit was spent in counseling and coordination of care about the stroke and answering questions.  Stroke team will sign off.  Kindly call for questions.     Delia Heady, MD Medical Director [facility] Pager: [phone] 12/21/2020 11:07 AM   To contact Stroke Continuity provider,  please refer to [email or web address] After hours, contact General Neurology

## 2020-12-21 NOTE — Progress Notes (Signed)
ANTICOAGULATION CONSULT NOTE - Follow Up Consult  Pharmacy Consult for heparin Indication: acute occlusion of the right superior cerebellar artery   Heparin level this am subtherapeutic but RN reports that IV line was leaking when pt was transferred to [facility], now replaced.  Will continue gtt at current rate for now and recheck level.  Vernard Gambles, PharmD, BCPS  12/21/2020,3:06 AM

## 2020-12-21 NOTE — Progress Notes (Signed)
PROGRESS NOTE    [name]  LKG:[id number] DOB: 03/15/1966 DOA: 12/17/2020 PCP: [name]    Brief Narrative:  55 year old gentleman, initially with unknown identity brought to the ER after he complained of chest pain and became unresponsive with left-sided weakness and left gaze deviation. Was intubated in the ER. Apparently seen by bystanders when patient complained of chest pain and all of a sudden he became unresponsive. 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 1/17-1/20, admitted to ICU, extubated to 4 L oxygen on 1/20. 1/21, transferred to [facility] service, progressive bed. Patient has different MRN in our hospital system, he has frequent hospitalization and was recently admitted and discharged a week ago with diabetic toe ulcer. Review his previous records indicate that he is homeless, he is noncompliant, uses cocaine. He has a history of COPD, bipolar 1 disorder, known chronic heart failure with recent ejection fraction 40%, ischemic heart disease and previously declined PCI.  Significant diagnostics: 1/17 CT head: Left cerebellar hypodensity concerning for acute/subacute infarct. No intracranial hemorrhage. Small right parietal scalp hematoma 1/17 CTA head and neck: Non opacification of the left vertebral artery with retrograde filling of the distal left V4 segment. Bilateral carotid bifurcation atheromatous disease with 50% right and 30% left carotid bulb narrowing. Mild left P2 segment narrowing.  2D echo 1/18>>>EF <20%, hypokinesis, mod LVH, grade 2 diastolic dysfunction,   EEG 1/19>> This studyshowed evidence of epileptogenicity arising fromright>leftfrontal region. There is also moderatediffuse encephalopathy, nonspecific etiology butcould be secondary tosedation.No seizures were seen throughout the  recording.  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 No repeat LFTs were checked, will check before starting. A1c 6.4 EEG with diffuse encephalopathy Patient is currently anticoagulated with heparin especially given very low ejection fraction. Patient is homeless and noncompliant, unsure whether he will be safe to go on Coumadin.  We may need to try NOAC.  Acute on chronic systolic and diastolic congestive heart failure: Recent echocardiogram with ejection fraction 40% New echocardiogram ejection fraction less than 20%. On aspirin, Lipitor, Entresto. Started on coreg and imdur today. Currently diuresing with Lasix 40 mg IV twice daily. Adequate urine output. Renal functions are stable. Discontinue Foley catheter.  Suspected apical thrombus: On heparin.  Plan to start Coumadin, however will be difficult to maintain.  Questionable seizure activity: No more seizure activities.  Neurology recommended no AEDs at this time.  Acute hypoxemic respiratory failure: Secondary to stroke.  Weaning to room air.  Acute metabolic toxic encephalopathy: Multifactorial.  Stroke, drug use, acute respiratory failure and sedation.  Stabilizing.  Acute liver failure: Last lab results are available in 1/18. We will repeat LFT today before starting statin.  Cocaine use: Counseled to quit.  Bipolar disorder/homelessness: When I discussed with patient about his stroke, substance abuse, severe problem with his heart, he tells me that this is too much for him and he was actually trying to end his life. Currently in the hospital.  Wants to  stay in the hospital and get treated. Denies any active plans  at this time. Consult psychiatry.  May benefit with psychiatric hospitalization.  Also consult palliative care medicine team to continue to address his severe comorbidities.   DVT prophylaxis: Heparin infusion.   Code Status: Full code Family Communication: None.  No family at bedside.  When asked about it, he says not to mess up with them. Disposition Plan: Status is: Inpatient  Remains inpatient appropriate because:Inpatient level of care appropriate due to severity of illness   Dispo: The patient is from: Home              Anticipated d/c is to: SNF or inpatient psych               Anticipated d/c date is: 3 days              Patient currently is not medically stable to d/c.    Consultants:   PCCM  Cardiology  Palliative  Psychiatry  Procedures:   None  Antimicrobials:   None   Subjective: Patient seen and examined.  Nursing reported that he gets restless and agitated at times. I discussed with patient in detail, he just needs that he is so disappointed about not able to use his right hand.  He keeps asking what happened to him that he is not able to use his right hand and surprised to hear that he has a stroke. I asked him why he ended up in the hospital.  He tells me that he had had enough so he hit his head on the sidewalk. I offered to talk to his family and he states "do not mess up with them, no need to talk to anyone". Can you take my catheter out , I want to use Urinal.  Objective: Vitals:   12/21/20 0057 12/21/20 0100 12/21/20 0425 12/21/20 0717  BP: (!) 148/103  128/77 (!) 144/90  Pulse: 94  87 98  Resp: (!) 25  (!) 24 (!) 23  Temp: 98.2 F (36.8 C)  98.1 F (36.7 C)   TempSrc: Axillary  Oral   SpO2: 96%  94% 90%  Weight:  94.5 kg 94.5 kg   Height:  5\' 9"  (1.753 m)      Intake/Output Summary (Last 24 hours) at 12/21/2020 1024 Last data filed at 12/21/2020 1010 Gross per 24 hour   Intake 1345.86 ml  Output 5075 ml  Net -3729.14 ml   Filed Weights   12/20/20 0500 12/21/20 0100 12/21/20 0425  Weight: 95 kg 94.5 kg 94.5 kg    Examination:  General exam: Appears calm but chronically sick looking, anxious, disheveled. Respiratory system: Clear to auscultation. Respiratory effort normal. Some conducted airway sounds. Cardiovascular system: S1 & S2 heard, tachycardic.  No murmur. Gastrointestinal system: Abdomen is nondistended, soft and nontender. No organomegaly or masses felt. Normal bowel sounds heard. Foley catheter with clear urine. Central nervous system: Alert and oriented x3. Cranial nerves with no obvious deficits. Right upper extremity 4/5.  Rest of the extremities with normal power. Psychiatry: Judgement and insight appear compromised.  He is anxious.  He has flat affect. Denies any delusions or hallucinations. He feels worthless.  Denies any active suicidal thoughts but feels like this is too much for him.    Data Reviewed: I have personally reviewed following labs and imaging studies  CBC: Recent Labs  Lab 12/17/20 1310 12/17/20 1312 12/17/20 1624 12/18/20 0336 12/19/20 0533 12/20/20 0527 12/21/20 0100  WBC 9.8  --   --  11.8* 7.8 7.0 8.9  NEUTROABS  4.9  --   --  8.9*  --   --   --   HGB 15.0   < > 13.6 13.6 11.6* 11.1* 12.5*  HCT 45.4   < > 40.0 42.0 36.5* 32.8* 36.9*  MCV 98.9  --   --  100.0 100.8* 98.8 98.1  PLT 396  --   --  317 272 261 291   < > = values in this interval not displayed.   Basic Metabolic Panel: Recent Labs  Lab 12/17/20 1310 12/17/20 1312 12/17/20 1624 12/18/20 0336 12/18/20 1034 12/18/20 2238 12/19/20 0533 12/19/20 1418 12/20/20 0827  NA 138 137  137 137 137  --   --  137  --  139  K 4.3 4.3  4.3 3.8 4.6  --   --  4.3  --  4.0  CL 102 105  105  --  104  --   --  108  --  110  CO2 22  --   --  22  --   --  20*  --  21*  GLUCOSE 108* 105*  105*  --  83  --   --  107*  --  148*  BUN 17 21*  21  --   16  --   --  16  --  16  CREATININE 1.32* 1.10  1.10  --  1.31*  --   --  1.16  --  1.02  CALCIUM 8.6*  --   --  8.0*  --   --  7.4*  --  7.9*  MG  --   --   --   --  1.8 1.8 1.8 1.9  --   PHOS  --   --   --   --  3.0 3.2 2.9 3.4  --    GFR: Estimated Creatinine Clearance: 96.1 mL/min (by C-G formula based on SCr of 1.02 mg/dL). Liver Function Tests: Recent Labs  Lab 12/17/20 1310 12/18/20 0336  AST 282* 174*  ALT 376* 284*  ALKPHOS 165* 129*  BILITOT 1.7* 1.1  PROT 6.9 5.6*  ALBUMIN 2.8* 2.3*   No results for input(s): LIPASE, AMYLASE in the last 168 hours. Recent Labs  Lab 12/18/20 0800  AMMONIA 24   Coagulation Profile: Recent Labs  Lab 12/17/20 1310  INR 1.5*   Cardiac Enzymes: No results for input(s): CKTOTAL, CKMB, CKMBINDEX, TROPONINI in the last 168 hours. BNP (last 3 results) No results for input(s): PROBNP in the last 8760 hours. HbA1C: No results for input(s): HGBA1C in the last 72 hours. CBG: Recent Labs  Lab 12/19/20 2316 12/20/20 0306 12/20/20 0733 12/20/20 1120 12/20/20 1522  GLUCAP 139* 112* 112* 104* 106*   Lipid Profile: Recent Labs    12/19/20 0533  TRIG 52   Thyroid Function Tests: No results for input(s): TSH, T4TOTAL, FREET4, T3FREE, THYROIDAB in the last 72 hours. Anemia Panel: No results for input(s): VITAMINB12, FOLATE, FERRITIN, TIBC, IRON, RETICCTPCT in the last 72 hours. Sepsis Labs: Recent Labs  Lab 12/17/20 2155  LATICACIDVEN 1.1    Recent Results (from the past 240 hour(s))  Resp Panel by RT-PCR (Flu A&B, Covid) Nasopharyngeal Swab     Status: None   Collection Time: 12/17/20  2:07 PM   Specimen: Nasopharyngeal Swab; Nasopharyngeal(NP) swabs in vial transport medium  Result Value Ref Range Status   SARS Coronavirus 2 by RT PCR NEGATIVE NEGATIVE Final    Comment: (NOTE) SARS-CoV-2 target nucleic acids are NOT DETECTED.  The  SARS-CoV-2 RNA is generally detectable in upper respiratory specimens during the acute  phase of infection. The lowest concentration of SARS-CoV-2 viral copies this assay can detect is 138 copies/mL. A negative result does not preclude SARS-Cov-2 infection and should not be used as the sole basis for treatment or other patient management decisions. A negative result may occur with  improper specimen collection/handling, submission of specimen other than nasopharyngeal swab, presence of viral mutation(s) within the areas targeted by this assay, and inadequate number of viral copies(<138 copies/mL). A negative result must be combined with clinical observations, patient history, and epidemiological information. The expected result is Negative.  Fact Sheet for Patients:  BloggerCourse.com  Fact Sheet for Healthcare Providers:  SeriousBroker.it  This test is no t yet approved or cleared by the [place] FDA and  has been authorized for detection and/or diagnosis of SARS-CoV-2 by FDA under an Emergency Use Authorization (EUA). This EUA will remain  in effect (meaning this test can be used) for the duration of the COVID-19 declaration under Section 564(b)(1) of the Act, 21 U.S.C.section 360bbb-3(b)(1), unless the authorization is terminated  or revoked sooner.       Influenza A by PCR NEGATIVE NEGATIVE Final   Influenza B by PCR NEGATIVE NEGATIVE Final    Comment: (NOTE) The Xpert Xpress SARS-CoV-2/FLU/RSV plus assay is intended as an aid in the diagnosis of influenza from Nasopharyngeal swab specimens and should not be used as a sole basis for treatment. Nasal washings and aspirates are unacceptable for Xpert Xpress SARS-CoV-2/FLU/RSV testing.  Fact Sheet for Patients: BloggerCourse.com  Fact Sheet for Healthcare Providers: SeriousBroker.it  This test is not yet approved or cleared by the [place] FDA and has been authorized for detection and/or diagnosis of  SARS-CoV-2 by FDA under an Emergency Use Authorization (EUA). This EUA will remain in effect (meaning this test can be used) for the duration of the COVID-19 declaration under Section 564(b)(1) of the Act, 21 U.S.C. section 360bbb-3(b)(1), unless the authorization is terminated or revoked.  Performed at [facility], [address] [place], [name] [address]   MRSA PCR Screening     Status: None   Collection Time: 12/17/20  6:02 PM   Specimen: Nasal Mucosa; Nasopharyngeal  Result Value Ref Range Status   MRSA by PCR NEGATIVE NEGATIVE Final    Comment:        The GeneXpert MRSA Assay (FDA approved for NASAL specimens only), is one component of a comprehensive MRSA colonization surveillance program. It is not intended to diagnose MRSA infection nor to guide or monitor treatment for MRSA infections. Performed at [facility], [address], [place], [name] [address]          Radiology Studies: EEG  Result Date: 12/19/2020 Charlsie Quest, MD     12/19/2020  3:00 PM Patient Name: Staci Carver MRN: [id number] Epilepsy Attending: Charlsie Quest Referring Physician/Provider: Dr Geralynn Rile Dagar Date: 12/19/2020 Duration: 25.09 mins  Patient history: unknown age male with ams, left gaze preference. EEG to evaluate for seizure.  Level of alertness: comatose  AEDs during EEG study: Propofol  Technical aspects: This EEG study was done with scalp electrodes positioned according to the 10-20 International system of electrode placement. Electrical activity was acquired at a sampling rate of 500Hz  and reviewed with a high frequency filter of 70Hz  and a low frequency filter of 1Hz . EEG data were recorded continuously and digitally stored.  Description: EEG showed continuous generalized 6 to 7  Hz theta slowing.  Right >left frontal spikes were also noted, maximal Hyperventilation and photic stimulation were not performed.    ABNORMALITY - Spikes, right> left frontal -Continuous  slow, generalized IMPRESSION: This study showed evidence of epileptogenicity arising from right>left frontal region.  There is also moderate diffuse encephalopathy, nonspecific etiology but could be secondary to sedation. No seizures were seen throughout the recording.  Dr Pearlean BrownieSethi was notified  Charlsie Questriyanka O Yadav   CT HEAD WO CONTRAST  Result Date: 12/19/2020 CLINICAL DATA:  Stroke. EXAM: CT HEAD WITHOUT CONTRAST TECHNIQUE: Contiguous axial images were obtained from the base of the skull through the vertex without intravenous contrast. COMPARISON:  MRI head 12/17/2020.  CT head 12/17/2020 FINDINGS: Brain: Hypodensity right superior cerebellum consistent with acute infarct as noted on diffusion-weighted imaging. No hemorrhage. Chronic hemorrhage left inferior cerebellum unchanged for MRI. Patchy white matter hypodensity bilaterally unchanged from MRI and consistent with chronic microvascular ischemia. Ventricle size normal. No acute hemorrhage. Vascular: Negative for hyperdense vessel Skull: Negative Sinuses/Orbits: Chronic fracture left medial orbit. No orbital lesion. Mild mucosal edema paranasal sinuses Other: None IMPRESSION: Acute infarct right superior cerebellum unchanged from MRI. No acute hemorrhage. Electronically Signed   By: Marlan Palauharles  Clark M.D.   On: 12/19/2020 10:57   DG Chest Port 1 View  Result Date: 12/21/2020 CLINICAL DATA:  Respiratory failure. EXAM: PORTABLE CHEST 1 VIEW COMPARISON:  December 05, 2020. FINDINGS: Stable cardiomegaly. No pneumothorax or pleural effusion is noted. Minimal bibasilar subsegmental atelectasis is noted. Bony thorax is unremarkable. IMPRESSION: Minimal bibasilar subsegmental atelectasis. Electronically Signed   By: Lupita RaiderJames  Green Jr M.D.   On: 12/21/2020 08:41        Scheduled Meds: . (feeding supplement) PROSource Plus  30 mL Oral BID BM  . aspirin  81 mg Oral Daily  . carvedilol  6.25 mg Oral BID WC  . Chlorhexidine Gluconate Cloth  6 each Topical Daily  .  feeding supplement  237 mL Oral BID BM  . furosemide  40 mg Intravenous BID  . hydrALAZINE  25 mg Per Tube TID  . isosorbide mononitrate  30 mg Oral Daily  . multivitamin with minerals  1 tablet Oral Daily  . polyethylene glycol  17 g Per Tube Daily  . sacubitril-valsartan  1 tablet Oral BID  . silver sulfADIAZINE   Topical BID   Continuous Infusions: . heparin 1,700 Units/hr (12/21/20 0453)     LOS: 4 days    Time spent: 35 minutes     Dorcas CarrowKuber Deannah Rossi, MD [facility] Pager [phone]

## 2020-12-22 ENCOUNTER — Encounter (HOSPITAL_COMMUNITY): Payer: Self-pay | Admitting: Neurology

## 2020-12-22 DIAGNOSIS — Z7189 Other specified counseling: Secondary | ICD-10-CM

## 2020-12-22 DIAGNOSIS — I5043 Acute on chronic combined systolic (congestive) and diastolic (congestive) heart failure: Secondary | ICD-10-CM | POA: Diagnosis not present

## 2020-12-22 DIAGNOSIS — I651 Occlusion and stenosis of basilar artery: Secondary | ICD-10-CM | POA: Diagnosis not present

## 2020-12-22 DIAGNOSIS — Z515 Encounter for palliative care: Secondary | ICD-10-CM

## 2020-12-22 DIAGNOSIS — J9601 Acute respiratory failure with hypoxia: Secondary | ICD-10-CM | POA: Diagnosis not present

## 2020-12-22 LAB — GLUCOSE, CAPILLARY
Glucose-Capillary: 183 mg/dL — ABNORMAL HIGH (ref 70–99)
Glucose-Capillary: 79 mg/dL (ref 70–99)
Glucose-Capillary: 223 mg/dL — ABNORMAL HIGH (ref 70–99)

## 2020-12-22 LAB — CBC
HCT: 37.8 % — ABNORMAL LOW (ref 39.0–52.0)
Hemoglobin: 12.7 g/dL — ABNORMAL LOW (ref 13.0–17.0)
MCH: 32.3 pg (ref 26.0–34.0)
MCHC: 33.6 g/dL (ref 30.0–36.0)
MCV: 96.2 fL (ref 80.0–100.0)
Platelets: 289 10*3/uL (ref 150–400)
RBC: 3.93 MIL/uL — ABNORMAL LOW (ref 4.22–5.81)
RDW: 15.5 % (ref 11.5–15.5)
WBC: 6.9 10*3/uL (ref 4.0–10.5)
nRBC: 0 % (ref 0.0–0.2)

## 2020-12-22 LAB — HEPARIN LEVEL (UNFRACTIONATED): Heparin Unfractionated: <0.1 IU/mL — ABNORMAL LOW (ref 0.30–0.70)

## 2020-12-22 MED ORDER — FUROSEMIDE 10 MG/ML IJ SOLN: 40.0000 mg | Freq: Two times a day (BID) | INTRAMUSCULAR | Status: DC

## 2020-12-22 MED ORDER — SPIRONOLACTONE 12.5 MG HALF TABLET
12.5000 mg | ORAL_TABLET | Freq: Every day | ORAL | Status: DC
  Administered 2020-12-22 – 2021-02-02 (×31): 12.5 mg via ORAL
  Filled 2020-12-22 (×43): qty 1

## 2020-12-22 MED ORDER — FUROSEMIDE 10 MG/ML IJ SOLN
40.0000 mg | Freq: Every day | INTRAMUSCULAR | Status: DC
  Administered 2020-12-23: 40 mg via INTRAVENOUS
  Filled 2020-12-22: qty 4

## 2020-12-22 NOTE — TOC Initial Note (Addendum)
Transition of Care [facility]) - Initial/Assessment Note    Patient Details  Name: [name] MRN: [id number] Date of Birth: 03-03-1966  Transition of Care [facility]) CM/SW Contact:    Verna Czech [place], [name] Phone Number: [phone] 12/22/2020, 4:26 PM  Clinical Narrative:                 Patient is a 55 year old male, [place]  SNF recommendation discussed with patient, patient is agreeable stating that he wants to get better. Patient confirmed being homeless at this time with no reliable social support system.  SNF process discussed, FL2 to be faxed out to facilities, PASRR  to be obtained.  Expected Discharge Plan: Skilled Nursing Facility Barriers to Discharge: Financial Resources,Homeless with medical needs,Inadequate or no insurance   Patient Goals and CMS Choice Patient states their goals for this hospitalization and ongoing recovery are:: "I want to get better"   Choice offered to / list presented to : Patient  Expected Discharge Plan and Services Expected Discharge Plan: Skilled Nursing Facility                                              Prior Living Arrangements/Services   Lives with:: Other (Comment) (Homeless)   Do you feel safe going back to the place where you live?: No   Patient currently homeless           Activities of Daily Living   ADL Screening (condition at time of admission) Patient's cognitive ability adequate to safely complete daily activities?: No Is the patient deaf or have difficulty hearing?: No Does the patient have difficulty seeing, even when wearing glasses/contacts?: No Does the patient have difficulty concentrating, remembering, or making decisions?: Yes Patient able to express need for assistance with ADLs?: No Does the patient have difficulty dressing or bathing?: Yes Independently performs ADLs?: No Communication: Dependent Is this a change from baseline?: Change  from baseline, expected to last >3 days Dressing (OT): Dependent Is this a change from baseline?: Change from baseline, expected to last >3 days Grooming: Dependent Is this a change from baseline?: Change from baseline, expected to last >3 days Feeding: Dependent Is this a change from baseline?: Change from baseline, expected to last >3 days Bathing: Dependent Is this a change from baseline?: Change from baseline, expected to last >3 days Toileting: Dependent Is this a change from baseline?: Change from baseline, expected to last >3days In/Out Bed: Dependent Is this a change from baseline?: Change from baseline, expected to last >3 days Walks in Home: Dependent Is this a change from baseline?: Change from baseline, expected to last >3 days Does the patient have difficulty walking or climbing stairs?: Yes Weakness of Legs: None Weakness of Arms/Hands: None  Permission Sought/Granted                  Emotional Assessment Appearance:: Appears older than stated age Attitude/Demeanor/Rapport: Lethargic Affect (typically observed): Accepting Orientation: : Oriented to Self,Oriented to Place,Oriented to  Time Alcohol / Substance Use: Illicit Drugs Psych Involvement: No (comment)  Admission diagnosis:  Stroke [facility]) [I63.9] Basilar artery embolus [I65.1] Patient Active Problem List   Diagnosis Date Noted   Acute on chronic combined systolic and diastolic CHF (congestive heart failure) (HCC) 12/20/2020   Respiratory failure (HCC)    Stroke (HCC) 12/17/2020  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   PCP:  [name] Pharm[facility]:   [facility] [phone] - [place], [place] - [address] AT [facility] [address] [name] [address] Phone: [phone] Fax: [phone]  [facility] [place], [name] - [address] [name] [address] Phone: [phone] Fax: [phone]     Social Determinants  of Health (SDOH) Interventions    Readmission Risk Interventions No flowsheet data found.

## 2020-12-22 NOTE — Progress Notes (Signed)
Progress Note  Patient Name: [name] Date of Encounter: 12/22/2020  [facility] Cardiologist: Rollene Rotunda, MD   Subjective   Headache this morning.   Inpatient Medications    Scheduled Meds: . (feeding supplement) PROSource Plus  30 mL Oral BID BM  . aspirin  81 mg Oral Daily  . atorvastatin  40 mg Oral Daily  . carvedilol  6.25 mg Oral BID WC  . Chlorhexidine Gluconate Cloth  6 each Topical Daily  . clopidogrel  75 mg Oral Daily  . divalproex  500 mg Oral Daily  . feeding supplement  237 mL Oral BID BM  . furosemide  40 mg Intravenous BID  . hydrALAZINE  25 mg Per Tube TID  . isosorbide mononitrate  30 mg Oral Daily  . multivitamin with minerals  1 tablet Oral Daily  . polyethylene glycol  17 g Per Tube Daily  . sacubitril-valsartan  1 tablet Oral BID  . silver sulfADIAZINE   Topical BID   Continuous Infusions:  PRN Meds: acetaminophen **OR** acetaminophen (TYLENOL) oral liquid 160 mg/5 mL **OR** acetaminophen, docusate, LORazepam, ondansetron (ZOFRAN) IV, phenol   Vital Signs    Vitals:   12/21/20 2325 12/22/20 0308 12/22/20 0616 12/22/20 0800  BP: 103/82 (!) 139/93 131/79 136/76  Pulse: 87 87 87 88  Resp: 15 19  18   Temp: 98.1 F (36.7 C) 98.4 F (36.9 C)  98.5 F (36.9 C)  TempSrc: Oral Oral  Axillary  SpO2: 99% 99%  98%  Weight:  89.2 kg    Height:        Intake/Output Summary (Last 24 hours) at 12/22/2020 1136 Last data filed at 12/22/2020 0800 Gross per 24 hour  Intake 480 ml  Output 5900 ml  Net -5420 ml   Last 3 Weights 12/22/2020 12/21/2020 12/21/2020  Weight (lbs) 196 lb 10.4 oz 208 lb 5.4 oz 208 lb 5.4 oz  Weight (kg) 89.2 kg 94.5 kg 94.5 kg      Telemetry    SR - Personally Reviewed  ECG    n/a - Personally Reviewed  Physical Exam   GEN: No acute distress.   Neck: No JVD Cardiac: RRR, no murmurs, rubs, or gallops.  Respiratory: Clear to auscultation bilaterally. GI: Soft, nontender, non-distended  MS: No edema; No  deformity. Neuro:  Nonfocal  Psych: Normal affect   Labs    High Sensitivity Troponin:   Recent Labs  Lab 12/17/20 1558 12/17/20 1800 12/18/20 1034  TROPONINIHS 46* 45* 42*      Chemistry Recent Labs  Lab 12/17/20 1310 12/17/20 1312 12/18/20 0336 12/19/20 0533 12/20/20 0827 12/21/20 0829  NA 138   < > 137 137 139  --   K 4.3   < > 4.6 4.3 4.0  --   CL 102   < > 104 108 110  --   CO2 22  --  22 20* 21*  --   GLUCOSE 108*   < > 83 107* 148*  --   BUN 17   < > 16 16 16   --   CREATININE 1.32*   < > 1.31* 1.16 1.02  --   CALCIUM 8.6*  --  8.0* 7.4* 7.9*  --   PROT 6.9  --  5.6*  --   --  6.3*  ALBUMIN 2.8*  --  2.3*  --   --  2.3*  AST 282*  --  174*  --   --  55*  ALT 376*  --  284*  --   --  147*  ALKPHOS 165*  --  129*  --   --  91  BILITOT 1.7*  --  1.1  --   --  0.7  GFRNONAA 36*  --  37* >60 >60  --   ANIONGAP 14  --  11 9 8   --    < > = values in this interval not displayed.     Hematology Recent Labs  Lab 12/20/20 0527 12/21/20 0100 12/22/20 0108  WBC 7.0 8.9 6.9  RBC 3.32* 3.76* 3.93*  HGB 11.1* 12.5* 12.7*  HCT 32.8* 36.9* 37.8*  MCV 98.8 98.1 96.2  MCH 33.4 33.2 32.3  MCHC 33.8 33.9 33.6  RDW 16.3* 16.2* 15.5  PLT 261 291 289    BNPNo results for input(s): BNP, PROBNP in the last 168 hours.   DDimer No results for input(s): DDIMER in the last 168 hours.   Radiology    DG Chest Port 1 View  Result Date: 12/21/2020 CLINICAL DATA:  Respiratory failure. EXAM: PORTABLE CHEST 1 VIEW COMPARISON:  December 05, 2020. FINDINGS: Stable cardiomegaly. No pneumothorax or pleural effusion is noted. Minimal bibasilar subsegmental atelectasis is noted. Bony thorax is unremarkable. IMPRESSION: Minimal bibasilar subsegmental atelectasis. Electronically Signed   By: December 07, 2020 M.D.   On: 12/21/2020 08:41    Cardiac Studies    Patient Profile      [name] is a 55 y.o.Male who is being seen today for the evaluation of CHF at the request of Dr.  44. This is a 55 yo male patient of Dr. 44 with a history of CHF with LVEF as low as 15% in the past, history of polysubstance abuse and cocaine positive this admission, [place] called for chest pain and then had mental status changes - admitted for CODE stroke  Assessment & Plan     Appears patient has 2 medical records, cardiac history under alternate MRN [name]**  1. Chronic systolic HF -consult note mentions prior LVEF 15% with recovery of LVEF to 40-45% - echo LVEF < 20%, grade 2 dd, mod RV dysfunction - from consult note Last cath in 10/2019 showed diffuse atherosclerosis with ~60% proximal LCx stenosis, which did not explain the degree of cardiomyopathy   11/2019 in 03/2020 and admitted to [facility]. Cardiac work-up including CCTA was performed which showed moderate to severe multivessel disease - further work-up was not completed d/t leaving.    - medical therapy with coreg 6.25mg  bid, hydral 25mg  tid, imdur 30mg , entresot 24/26mg  bid.  - start aldactone 12.5mg  daily - on lasix 40mg  IV bid, labs pending today. From I/Os large diuresis yesterday negative 6.1 L. Would scale lasix back to IV 40mg  once daily to avoid too rapid diuresis   2. Polysubstance abuse - cocaine + this admission   3. CVA -per neuro  4. CAD:  - cardiac CT report above, pt w/ CAD but left AMA 04/26/2020 before cath performed, has not had PCI - may consider cath as outpatient few weeks after CVA is etasblishes compliance    For questions or updates, please contact [facility] Please consult [email or web address] for contact info under        Signed, , MD  12/22/2020, 11:36 AM

## 2020-12-22 NOTE — Plan of Care (Signed)

## 2020-12-22 NOTE — Progress Notes (Signed)
PROGRESS NOTE    [name]  WUX:[id number] DOB: 1966-01-16 DOA: 12/17/2020 PCP: [name]    Brief Narrative:  55 year old gentleman, initially with unknown identity brought to the ER after he complained of chest pain and became unresponsive with left-sided weakness and left gaze deviation. Was intubated in the ER. Apparently seen by bystanders when patient complained of chest pain and all of a sudden he became unresponsive. 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 1/17-1/20, admitted to ICU, extubated to 4 L oxygen on 1/20. 1/21, transferred to [facility] service, progressive bed. Patient has different MRN in our hospital system, he has frequent hospitalization and was recently admitted and discharged a week ago with diabetic toe ulcer. Review his previous records indicate that he is homeless, he is noncompliant, uses cocaine. He has a history of COPD, bipolar 1 disorder, known chronic heart failure with recent ejection fraction 40%, ischemic heart disease and previously declined PCI.  Significant diagnostics: 1/17 CT head: Left cerebellar hypodensity concerning for acute/subacute infarct. No intracranial hemorrhage. Small right parietal scalp hematoma 1/17 CTA head and neck: Non opacification of the left vertebral artery with retrograde filling of the distal left V4 segment. Bilateral carotid bifurcation atheromatous disease with 50% right and 30% left carotid bulb narrowing. Mild left P2 segment narrowing. 2D echo 1/18>>>EF <20%, hypokinesis, mod LVH, grade 2 diastolic dysfunction,  EEG 1/19>> This studyshowed evidence of epileptogenicity arising fromright>leftfrontal region. There is also moderatediffuse encephalopathy, nonspecific etiology butcould be secondary tosedation.No seizures were seen throughout the  recording.  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  Liver function test normalizing. A1c 6.4 EEG with diffuse encephalopathy Patient was treated on heparin.  Now on aspirin and Plavix for 3 weeks then aspirin alone.  Acute on chronic systolic and diastolic congestive heart failure: Recent echocardiogram with ejection fraction 40% New echocardiogram ejection fraction less than 20%. On aspirin, Lipitor, Entresto. Started on coreg and imdur. Currently diuresing with Lasix 40 mg IV twice daily. Adequate urine output. Renal functions are stable. We will renew Lasix.  Suspected apical thrombus: Ruled out.  Heparin discontinued.    Questionable seizure activity: No more seizure activities.  Neurology recommended no AEDs at this time.  Acute hypoxemic respiratory failure: Secondary to stroke.  Currently on room air.  Acute metabolic toxic encephalopathy: Multifactorial.  Stroke, drug use, acute respiratory failure and sedation.  Stabilizing.  Acute liver failure: Improved.  Probably due to ischemic liver.  Cocaine use: Counseled to quit.  Bipolar disorder/homelessness: Poor compliance. Homelessness.  Consulted psychiatry.  Denies any active suicidal or homicidal ideation.  No hallucinations or delusions.  Also consult palliative care medicine team to continue to address his severe comorbidities.  Consult pending.  Continue mobilizing.  Referred to SNF.   DVT prophylaxis: Heparin infusion.   Code Status: Full code Family Communication: None.  No family at bedside.  When asked about it, he says not to mess up  with them. Disposition Plan: Status is: Inpatient  Remains inpatient appropriate because:Inpatient level of care  appropriate due to severity of illness   Dispo: The patient is from: Home              Anticipated d/c is to: SNF.              Anticipated d/c date is:  1 to 2 days.      Patient currently is not medically stable to d/c.    Consultants:   PCCM  Cardiology  Palliative  Psychiatry  Procedures:   None  Antimicrobials:   None   Subjective: Patient seen and examined.  No overnight events.  He was really disappointed that he is banging his right hand around and not able to have control on it.  He is on room air.   Objective: Vitals:   12/21/20 2325 12/22/20 0308 12/22/20 0616 12/22/20 0800  BP: 103/82 (!) 139/93 131/79 136/76  Pulse: 87 87 87 88  Resp: 15 19  18   Temp: 98.1 F (36.7 C) 98.4 F (36.9 C)  98.5 F (36.9 C)  TempSrc: Oral Oral  Axillary  SpO2: 99% 99%  98%  Weight:  89.2 kg    Height:        Intake/Output Summary (Last 24 hours) at 12/22/2020 1124 Last data filed at 12/22/2020 0800 Gross per 24 hour  Intake 480 ml  Output 5900 ml  Net -5420 ml   Filed Weights   12/21/20 0100 12/21/20 0425 12/22/20 0308  Weight: 94.5 kg 94.5 kg 89.2 kg    Examination:  General exam: Appears calm but chronically sick looking, anxious, disheveled. Respiratory system: Clear to auscultation. Respiratory effort normal.  Cardiovascular system: S1 & S2 heard, regular.  No murmur. Gastrointestinal system: Abdomen is nondistended, soft and nontender. No organomegaly or masses felt. Normal bowel sounds heard. Central nervous system: Alert and oriented x3. Cranial nerves with no obvious deficits. Right upper extremity 3-4/5.  Rest of the extremities with normal power. Psychiatry: Judgement and insight appear compromised.  He is anxious.  He has flat affect. Denies any delusions or hallucinations.   Data Reviewed: I have personally reviewed following  labs and imaging studies  CBC: Recent Labs  Lab 12/17/20 1310 12/17/20 1312 12/18/20 0336 12/19/20 0533 12/20/20 0527 12/21/20 0100 12/22/20 0108  WBC 9.8  --  11.8* 7.8 7.0 8.9 6.9  NEUTROABS 4.9  --  8.9*  --   --   --   --   HGB 15.0   < > 13.6 11.6* 11.1* 12.5* 12.7*  HCT 45.4   < > 42.0 36.5* 32.8* 36.9* 37.8*  MCV 98.9  --  100.0 100.8* 98.8 98.1 96.2  PLT 396  --  317 272 261 291 289   < > = values in this interval not displayed.   Basic Metabolic Panel: Recent Labs  Lab 12/17/20 1310 12/17/20 1312 12/17/20 1624 12/18/20 0336 12/18/20 1034 12/18/20 2238 12/19/20 0533 12/19/20 1418 12/20/20 0827  NA 138 137  137 137 137  --   --  137  --  139  K 4.3 4.3  4.3 3.8 4.6  --   --  4.3  --  4.0  CL 102 105  105  --  104  --   --  108  --  110  CO2 22  --   --  22  --   --  20*  --  21*  GLUCOSE 108* 105*  105*  --  83  --   --  107*  --  148*  BUN 17 21*  21  --  16  --   --  16  --  16  CREATININE 1.32* 1.10  1.10  --  1.31*  --   --  1.16  --  1.02  CALCIUM 8.6*  --   --  8.0*  --   --  7.4*  --  7.9*  MG  --   --   --   --  1.8 1.8 1.8 1.9  --   PHOS  --   --   --   --  3.0 3.2 2.9 3.4  --    GFR: Estimated Creatinine Clearance: 93.6 mL/min (by C-G formula based on SCr of 1.02 mg/dL). Liver Function Tests: Recent Labs  Lab 12/17/20 1310 12/18/20 0336 12/21/20 0829  AST 282* 174* 55*  ALT 376* 284* 147*  ALKPHOS 165* 129* 91  BILITOT 1.7* 1.1 0.7  PROT 6.9 5.6* 6.3*  ALBUMIN 2.8* 2.3* 2.3*   No results for input(s): LIPASE, AMYLASE in the last 168 hours. Recent Labs  Lab 12/18/20 0800  AMMONIA 24   Coagulation Profile: Recent Labs  Lab 12/17/20 1310  INR 1.5*   Cardiac Enzymes: No results for input(s): CKTOTAL, CKMB, CKMBINDEX, TROPONINI in the last 168 hours. BNP (last 3 results) No results for input(s): PROBNP in the last 8760 hours. HbA1C: No results for input(s): HGBA1C in the last 72 hours. CBG: Recent Labs  Lab 12/20/20 0306  12/20/20 0733 12/20/20 1120 12/20/20 1522 12/22/20 0814  GLUCAP 112* 112* 104* 106* 183*   Lipid Profile: No results for input(s): CHOL, HDL, LDLCALC, TRIG, CHOLHDL, LDLDIRECT in the last 72 hours. Thyroid Function Tests: No results for input(s): TSH, T4TOTAL, FREET4, T3FREE, THYROIDAB in the last 72 hours. Anemia Panel: No results for input(s): VITAMINB12, FOLATE, FERRITIN, TIBC, IRON, RETICCTPCT in the last 72 hours. Sepsis Labs: Recent Labs  Lab 12/17/20 2155  LATICACIDVEN 1.1    Recent Results (from the past 240 hour(s))  Resp Panel by RT-PCR (Flu A&B, Covid) Nasopharyngeal Swab     Status: None   Collection Time: 12/17/20  2:07 PM   Specimen: Nasopharyngeal Swab; Nasopharyngeal(NP) swabs in vial transport medium  Result Value Ref Range Status   SARS Coronavirus 2 by RT PCR NEGATIVE NEGATIVE Final    Comment: (NOTE) SARS-CoV-2 target nucleic acids are NOT DETECTED.  The SARS-CoV-2 RNA is generally detectable in upper respiratory specimens during the acute phase of infection. The lowest concentration of SARS-CoV-2 viral copies this assay can detect is 138 copies/mL. A negative result does not preclude SARS-Cov-2 infection and should not be used as the sole basis for treatment or other patient management decisions. A negative result may occur with  improper specimen collection/handling, submission of specimen other than nasopharyngeal swab, presence of viral mutation(s) within the areas targeted by this assay, and inadequate number of viral copies(<138 copies/mL). A negative result must be combined with clinical observations, patient history, and epidemiological information. The expected result is Negative.  Fact Sheet for Patients:  BloggerCourse.com  Fact Sheet for Healthcare Providers:  SeriousBroker.it  This test is no t yet approved or cleared by the [place] FDA and  has been authorized for detection and/or  diagnosis of SARS-CoV-2 by FDA under an Emergency Use Authorization (EUA). This EUA will remain  in effect (meaning this test can be used) for the duration of the COVID-19 declaration under Section 564(b)(1) of the Act, 21 U.S.C.section 360bbb-3(b)(1), unless the authorization is terminated  or revoked sooner.  Influenza A by PCR NEGATIVE NEGATIVE Final   Influenza B by PCR NEGATIVE NEGATIVE Final    Comment: (NOTE) The Xpert Xpress SARS-CoV-2/FLU/RSV plus assay is intended as an aid in the diagnosis of influenza from Nasopharyngeal swab specimens and should not be used as a sole basis for treatment. Nasal washings and aspirates are unacceptable for Xpert Xpress SARS-CoV-2/FLU/RSV testing.  Fact Sheet for Patients: BloggerCourse.com  Fact Sheet for Healthcare Providers: SeriousBroker.it  This test is not yet approved or cleared by the [place] FDA and has been authorized for detection and/or diagnosis of SARS-CoV-2 by FDA under an Emergency Use Authorization (EUA). This EUA will remain in effect (meaning this test can be used) for the duration of the COVID-19 declaration under Section 564(b)(1) of the Act, 21 U.S.C. section 360bbb-3(b)(1), unless the authorization is terminated or revoked.  Performed at [facility], [address], [place], [name] [address]   MRSA PCR Screening     Status: None   Collection Time: 12/17/20  6:02 PM   Specimen: Nasal Mucosa; Nasopharyngeal  Result Value Ref Range Status   MRSA by PCR NEGATIVE NEGATIVE Final    Comment:        The GeneXpert MRSA Assay (FDA approved for NASAL specimens only), is one component of a comprehensive MRSA colonization surveillance program. It is not intended to diagnose MRSA infection nor to guide or monitor treatment for MRSA infections. Performed at [facility], [address], [place], [name] [address]          Radiology  Studies: DG Chest Port 1 View  Result Date: 12/21/2020 CLINICAL DATA:  Respiratory failure. EXAM: PORTABLE CHEST 1 VIEW COMPARISON:  December 05, 2020. FINDINGS: Stable cardiomegaly. No pneumothorax or pleural effusion is noted. Minimal bibasilar subsegmental atelectasis is noted. Bony thorax is unremarkable. IMPRESSION: Minimal bibasilar subsegmental atelectasis. Electronically Signed   By: Lupita Raider M.D.   On: 12/21/2020 08:41        Scheduled Meds: . (feeding supplement) PROSource Plus  30 mL Oral BID BM  . aspirin  81 mg Oral Daily  . atorvastatin  40 mg Oral Daily  . carvedilol  6.25 mg Oral BID WC  . Chlorhexidine Gluconate Cloth  6 each Topical Daily  . clopidogrel  75 mg Oral Daily  . divalproex  500 mg Oral Daily  . feeding supplement  237 mL Oral BID BM  . furosemide  40 mg Intravenous BID  . hydrALAZINE  25 mg Per Tube TID  . isosorbide mononitrate  30 mg Oral Daily  . multivitamin with minerals  1 tablet Oral Daily  . polyethylene glycol  17 g Per Tube Daily  . sacubitril-valsartan  1 tablet Oral BID  . silver sulfADIAZINE   Topical BID   Continuous Infusions:    LOS: 5 days    Time spent: 35 minutes     Dorcas Carrow, MD [facility] Pager [phone]

## 2020-12-22 NOTE — Consult Note (Signed)
Consultation Note Date: 12/22/2020   Patient Name: [name]  DOB: Dec 28, 1965  MRN: [id number]  Age / Sex: 55 y.o., male  PCP: [facility] Referring Physician: Barb Merino, MD  Reason for Consultation: Establishing goals of care and Psychosocial/spiritual support  HPI/Patient Profile: 55 y.o. male  with past medical history of CHF with LVEF as low as 15% in the past, polysubstance abuse/cocaine positive this admission, admitted on 12/17/2020.  Patient collapsed at [facility] and EMS called for chest pain and mental status changes.  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  Clinical Assessment and Goals of Care: I have reviewed medical records including EPIC notes, labs and imaging, received report from bedside nursing staff, examined the patient and met at bedside with [name] to discuss diagnosis prognosis, [place], EOL wishes, disposition and options. I introduced Palliative Medicine as specialized medical care for people living with serious illness. It focuses on providing relief from the symptoms and stress of a serious illness.   Mr. Lang is lying quietly in bed.  He greets me making and somewhat keeping eye contact.  He appears chronically ill and frail, older than stated age.  Although he is alert and oriented, he is unable to fully participate in meaningful goals of care discussion.  He would consistently state that he cannot move, cannot use his right side as he is, at that time, using his right side.  At this point he wants all measures, full scope/full code. I found it difficult to push further into "what if's and maybe's"  He was reluctant to have detailed discussions, c okay I am doing that at hanging the subject when I talked about difficult issues.  We talk about  his care.  Who could make healthcare choices if he were unable.  He states that his friend, [name] would be his Air traffic controller.  He becomes upset stating that he has not heard from [facility].  [place] asks that I reached out to [place].  Call to [facility] at the number listed in chart, [phone], this number is not in service.  No other number listed. There is no additional patient contact listed in [name] other chart.  Same number for [place].  Conference with attending, bedside nursing staff, transition of care team related to patient condition, needs, goals of care. PMT to continue to follow.  HCPOA   OTHER -[place] is his own Media planner.  He shares that if he were to be unable to make choices his preference is that his friend, [name], he has surrogate.  There is no other contact listed in his chart.    SUMMARY OF RECOMMENDATIONS   At this point full scope/full code. Would benefit from in-depth goals of care discussion, but unable at this point due to mental state. Would benefit from long-term care placement, but doubtful that [name] would accept this.  Code Status/Advance Care Planning:  Full code -unable to  have meaningful CODE STATUS discussion with patient today.  No healthcare surrogate available  Symptom Management:   Per hospitalist, no additional needs at this time.  Palliative Prophylaxis:   Frequent Pain Assessment, Oral Care and Turn Reposition  Additional Recommendations (Limitations, Scope, Preferences):  Full Scope Treatment  Psycho-social/Spiritual:   Desire for further Chaplaincy support:no  Additional Recommendations: Referral to Community Resources   Prognosis:   Unable to determine, guarded at this point multiple ED visits and hospital visits complicated by homelessness and illicit drug use.  Discharge Planning: To be determined, based on outcomes.  Would clearly benefit from long-term care, but unlikely that he would accept this.       Primary Diagnoses: Present on Admission: . Stroke ([name]) . Basilar artery embolus . Acute on chronic combined systolic and diastolic CHF (congestive heart failure) ([name])   I have reviewed the medical record, interviewed the patient and family, and examined the patient. The following aspects are pertinent.  History reviewed. No pertinent past medical history. Social History   Socioeconomic History  . Marital status: Single    Spouse name: Not on file  . Number of children: Not on file  . Years of education: Not on file  . Highest education level: Not on file  Occupational History  . Not on file  Tobacco Use  . Smoking status: Not on file  . Smokeless tobacco: Not on file  Substance and Sexual Activity  . Alcohol use: Not on file  . Drug use: Not on file  . Sexual activity: Not on file  Other Topics Concern  . Not on file  Social History Narrative  . Not on file   Social Determinants of Health   Financial Resource Strain: Not on file  Food Insecurity: Not on file  Transportation Needs: Not on file  Physical Activity: Not on file  Stress: Not on file  Social Connections: Not on file   History reviewed. No pertinent family history. Scheduled Meds: . (feeding supplement) PROSource Plus  30 mL Oral BID BM  . aspirin  81 mg Oral Daily  . atorvastatin  40 mg Oral Daily  . carvedilol  6.25 mg Oral BID WC  . Chlorhexidine Gluconate Cloth  6 each Topical Daily  . clopidogrel  75 mg Oral Daily  . divalproex  500 mg Oral Daily  . feeding supplement  237 mL Oral BID BM  . [START ON 12/23/2020] furosemide  40 mg Intravenous Daily  . hydrALAZINE  25 mg Per Tube TID  . isosorbide mononitrate  30 mg Oral Daily  . multivitamin with minerals  1 tablet Oral Daily  . polyethylene glycol  17 g Per Tube Daily  . sacubitril-valsartan  1 tablet Oral BID  . silver sulfADIAZINE   Topical BID  . spironolactone  12.5 mg Oral Daily   Continuous Infusions: PRN Meds:.acetaminophen  **OR** acetaminophen (TYLENOL) oral liquid 160 mg/5 mL **OR** acetaminophen, docusate, LORazepam, ondansetron (ZOFRAN) IV, phenol Medications Prior to Admission:  Prior to Admission medications   Medication Sig Start Date End Date Taking? Authorizing Provider  atorvastatin (LIPITOR) 40 MG tablet Take 40 mg by mouth daily.    [provider]  carvedilol (COREG) 3.125 MG tablet Take 3.125 mg by mouth 2 (two) times daily with a meal.    [provider]  cloNIDine (CATAPRES) 0.1 MG tablet Take 0.1 mg by mouth 3 (three) times daily.    [provider]  clopidogrel (PLAVIX) 75 MG tablet Take 75 mg by  mouth daily.    [provider]  divalproex (DEPAKOTE ER) 500 MG 24 hr tablet Take 500 mg by mouth in the morning and at bedtime.    [provider]  doxycycline (VIBRA-TABS) 100 MG tablet Take 100 mg by mouth every 12 (twelve) hours. FOR 5 DAYS    [provider]  furosemide (LASIX) 40 MG tablet Take 40 mg by mouth daily.    [provider]  hydrALAZINE (APRESOLINE) 50 MG tablet Take 50 mg by mouth every 8 (eight) hours.    [provider]  lisinopril (ZESTRIL) 20 MG tablet Take 20 mg by mouth daily.    [provider]  oxyCODONE (OXY IR/ROXICODONE) 5 MG immediate release tablet Take 5 mg by mouth every 4 (four) hours as needed for severe pain.    [provider]  predniSONE (DELTASONE) 10 MG tablet Take 10 mg by mouth daily with breakfast. FOR 3 DAYS    [provider]   Allergies  Allergen Reactions  . Ibuprofen Anaphylaxis  . Latex Other (See Comments)    Reaction??  . Peanut-Containing Drug Products Other (See Comments)    Allergic to peanut butter (reaction??)  . Tylenol [Acetaminophen] Hives and Nausea And Vomiting   Review of Systems  Unable to perform ROS: Mental status change    Physical Exam Vitals and nursing note reviewed.  Constitutional:      General: He is not in acute distress.     Appearance: He is ill-appearing.  HENT:     Head: Normocephalic and atraumatic.     Mouth/Throat:     Mouth: Mucous membranes are moist.  Cardiovascular:     Rate and Rhythm: Normal rate.  Pulmonary:     Effort: Pulmonary effort is normal. No respiratory distress.  Skin:    General: Skin is warm and dry.  Neurological:     Mental Status: He is alert and oriented to person, place, and time.  Psychiatric:     Comments: Pressured speech at times     Vital Signs: BP (!) 145/101 (BP Location: Right Arm)   Pulse 88   Temp 98.5 F (36.9 C) (Axillary)   Resp 18   Ht '5\' 9"'  (1.753 m)   Wt 89.2 kg   SpO2 98%   BMI 29.04 kg/m  Pain Scale: 0-10   Pain Score: Asleep   SpO2: SpO2: 98 % O2 Device:SpO2: 98 % O2 Flow Rate: .O2 Flow Rate (L/min): 4 L/min  IO: Intake/output summary:   Intake/Output Summary (Last 24 hours) at 12/22/2020 1535 Last data filed at 12/22/2020 1200 Gross per 24 hour  Intake 720 ml  Output 6700 ml  Net -5980 ml    LBM: Last BM Date: 12/21/20 Baseline Weight: Weight: 95.1 kg Most recent weight: Weight: 89.2 kg     Palliative Assessment/Data:   Flowsheet Rows   Flowsheet Row Most Recent Value  Intake Tab   Referral Department Hospitalist  Unit at Time of Referral Cardiac/Telemetry Unit  Palliative Care Primary Diagnosis Cardiac  Date Notified 12/21/20  Palliative Care Type New Palliative care  Reason for referral Clarify Goals of Care  Date of Admission 12/17/20  Date first seen by Palliative Care 12/22/20  # of days Palliative referral response time 1 Day(s)  # of days IP prior to Palliative referral 4  Clinical Assessment   Palliative Performance Scale Score 50%  Pain Max last 24 hours Not able to report  Pain Min Last 24 hours Not able to report  Dyspnea Max Last 24 Hours Not able to report  Dyspnea Min Last 24 hours Not able to report  Psychosocial & Spiritual Assessment   Palliative Care Outcomes       Time In: 1340 Time Out:  1450 Time Total: 50 minutes Greater than 50%  of this time was spent counseling and coordinating care related to the above assessment and plan.  Signed by: Drue Novel, NP   Please contact Palliative Medicine Team phone at [phone] for questions and concerns.  For individual provider: See Shea Evans

## 2020-12-23 DIAGNOSIS — I651 Occlusion and stenosis of basilar artery: Secondary | ICD-10-CM | POA: Diagnosis not present

## 2020-12-23 DIAGNOSIS — I5043 Acute on chronic combined systolic (congestive) and diastolic (congestive) heart failure: Secondary | ICD-10-CM | POA: Diagnosis not present

## 2020-12-23 LAB — BASIC METABOLIC PANEL
Anion gap: 10 (ref 5–15)
BUN: 9 mg/dL (ref 6–20)
CO2: 26 mmol/L (ref 22–32)
Calcium: 8.6 mg/dL — ABNORMAL LOW (ref 8.9–10.3)
Chloride: 100 mmol/L (ref 98–111)
Creatinine, Ser: 0.94 mg/dL (ref 0.61–1.24)
GFR, Estimated: >60 mL/min (ref >60)
Glucose, Bld: 103 mg/dL — ABNORMAL HIGH (ref 70–99)
Potassium: 3.6 mmol/L (ref 3.5–5.1)
Sodium: 136 mmol/L (ref 135–145)

## 2020-12-23 LAB — MAGNESIUM: Magnesium: 1.9 mg/dL (ref 1.7–2.4)

## 2020-12-23 LAB — GLUCOSE, CAPILLARY
Glucose-Capillary: 113 mg/dL — ABNORMAL HIGH (ref 70–99)
Glucose-Capillary: 160 mg/dL — ABNORMAL HIGH (ref 70–99)
Glucose-Capillary: 110 mg/dL — ABNORMAL HIGH (ref 70–99)
Glucose-Capillary: 103 mg/dL — ABNORMAL HIGH (ref 70–99)
Glucose-Capillary: 145 mg/dL — ABNORMAL HIGH (ref 70–99)

## 2020-12-23 LAB — PHOSPHORUS: Phosphorus: 4.3 mg/dL (ref 2.5–4.6)

## 2020-12-23 MED ORDER — FUROSEMIDE 40 MG PO TABS
40.0000 mg | ORAL_TABLET | Freq: Every day | ORAL | Status: DC
  Administered 2020-12-24 – 2021-01-09 (×13): 40 mg via ORAL
  Filled 2020-12-23 (×15): qty 1

## 2020-12-23 MED ORDER — OXYCODONE HCL 5 MG PO TABS
5.0000 mg | ORAL_TABLET | Freq: Once | ORAL | Status: AC
  Administered 2020-12-23: 5 mg via ORAL
  Filled 2020-12-23: qty 1

## 2020-12-23 NOTE — Progress Notes (Signed)
PROGRESS NOTE    [name]  PXT:[id number] DOB: 1966/03/05 DOA: 12/17/2020 PCP: [name]    Brief Narrative:  55 year old gentleman, initially with unknown identity brought to the ER after he complained of chest pain and became unresponsive with left-sided weakness and left gaze deviation. Was intubated in the ER. Apparently seen by bystanders when patient complained of chest pain and all of a sudden he became unresponsive. 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 1/17-1/20, admitted to ICU, extubated to 4 L oxygen on 1/20. 1/21, transferred to [facility] service, progressive bed. Patient has different MRN in our hospital system, he has frequent hospitalization and was recently admitted and discharged a week ago with diabetic toe ulcer. Review his previous records indicate that he is homeless, he is noncompliant, uses cocaine. He has a history of COPD, bipolar 1 disorder, known chronic heart failure with recent ejection fraction 40%, ischemic heart disease and previously declined PCI.  Significant diagnostics: 1/17 CT head: Left cerebellar hypodensity concerning for acute/subacute infarct. No intracranial hemorrhage. Small right parietal scalp hematoma 1/17 CTA head and neck: Non opacification of the left vertebral artery with retrograde filling of the distal left V4 segment. Bilateral carotid bifurcation atheromatous disease with 50% right and 30% left carotid bulb narrowing. Mild left P2 segment narrowing. 2D echo 1/18>>>EF <20%, hypokinesis, mod LVH, grade 2 diastolic dysfunction,  EEG 1/19>> This studyshowed evidence of epileptogenicity arising fromright>leftfrontal region. There is also moderatediffuse encephalopathy, nonspecific etiology butcould be secondary tosedation.No seizures were seen throughout the  recording.  Assessment & Plan:   Active Problems:   Stroke 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  Liver function tests normalized. A1c 6.4 EEG with diffuse encephalopathy Patient was treated on heparin.  Now on aspirin and Plavix for 3 weeks then aspirin alone.  Acute on chronic systolic and diastolic congestive heart failure: Recent echocardiogram with ejection fraction 40% New echocardiogram ejection fraction less than 20%. On aspirin, Lipitor, Entresto, aldactone, coreg and Imdur. Diuresis with IV Lasix.  Excellent urine output. Taking off IV line and telemetry. Will change to oral Lasix.  Renal functions are stable.  Recheck tomorrow morning.  Suspected apical thrombus: Ruled out.  Heparin discontinued.    Questionable seizure activity: No more seizure activities.  Neurology recommended no AEDs at this time.  Acute hypoxemic respiratory failure: Secondary to stroke.  Currently on room air.  Acute metabolic toxic encephalopathy: Multifactorial.  Stroke, drug use, acute respiratory failure and sedation.  Stabilizing. Patient has baseline behavior problems and intermittent agitations, aggressiveness towards the staff.  Acute liver failure: Improved.  Probably due to ischemic liver.  Cocaine use: Counseled to quit.  Bipolar disorder/homelessness: Poor compliance. Homelessness.  Consulted psychiatry.  Denies any active suicidal or homicidal ideation.  No hallucinations or delusions.  Also consult palliative care medicine team to continue to address his severe comorbidities.    Continue mobilizing.  Referred to SNF. Patient is throwing away his  telemetry and taking off his IV lines, discontinue telemetry.  Transfer to MedSurg bed. Transfer to a skilled nursing facility if available.  DVT prophylaxis: Aspirin and Plavix.   Code Status: Full code Family Communication: None.  No family at bedside.  When asked about it, he says not to mess up with them. Number for his good friend is nonexistent. Disposition Plan: Status is: Inpatient  Remains inpatient appropriate because:Inpatient level of care appropriate due to severity of illness   Dispo: The patient is from: Home              Anticipated d/c is to: SNF.              Anticipated d/c date is:  Advertising account executive.     Patient currently is medically stable to transfer.    Consultants:   PCCM  Cardiology  Palliative  Psychiatry  Procedures:   None  Antimicrobials:   None   Subjective: Patient seen and examined.  Very disappointed about unable to walk.  He is frustrated about his right-sided weakness. Nursing reported that he refuses care and gets abusive to the staff. I discussed this with patient, he expresses frustration about not able to walk and not able to use his right hand.  Objective: Vitals:   12/22/20 1600 12/22/20 1900 12/23/20 0500 12/23/20 0727  BP: (!) 135/95 (!) 144/79 (!) 166/97 (!) 151/113  Pulse: 81 90 88 88  Resp: 20 20 20 19   Temp: 98.7 F (37.1 C) 98.8 F (37.1 C) 98 F (36.7 C) 98 F (36.7 C)  TempSrc: Oral Oral Oral Axillary  SpO2: 98% 99% 98% 98%  Weight:   85.1 kg   Height:        Intake/Output Summary (Last 24 hours) at 12/23/2020 1118 Last data filed at 12/23/2020 0800 Gross per 24 hour  Intake 720 ml  Output 4300 ml  Net -3580 ml   Filed Weights   12/21/20 0425 12/22/20 0308 12/23/20 0500  Weight: 94.5 kg 89.2 kg 85.1 kg    Examination:  General exam: Appears calm but chronically sick looking, anxious, disheveled. Respiratory system: Clear to auscultation. Respiratory effort normal.  Cardiovascular system: S1 & S2  heard, regular.  No murmur. Gastrointestinal system: Abdomen is nondistended, soft and nontender. No organomegaly or masses felt. Normal bowel sounds heard. Central nervous system: Alert and oriented x3. Cranial nerves with no obvious deficits. Right upper extremity 3-4/5.  Rest of the extremities with normal power. Psychiatry: Judgement and insight appear compromised.  He is anxious.  He has flat affect. Denies any delusions or hallucinations.   Data Reviewed: I have personally reviewed following labs and imaging studies  CBC: Recent Labs  Lab 12/17/20 1310 12/17/20 1312 12/18/20 0336 12/19/20 0533 12/20/20 0527 12/21/20 0100 12/22/20 0108  WBC 9.8  --  11.8* 7.8 7.0 8.9 6.9  NEUTROABS 4.9  --  8.9*  --   --   --   --   HGB 15.0   < > 13.6 11.6* 11.1* 12.5* 12.7*  HCT 45.4   < > 42.0 36.5* 32.8* 36.9* 37.8*  MCV 98.9  --  100.0 100.8* 98.8 98.1 96.2  PLT 396  --  317 272 261 291 289   < > = values in this interval not displayed.   Basic Metabolic Panel: Recent Labs  Lab 12/17/20 1310 12/17/20 1312 12/17/20 1624 12/18/20 0336 12/18/20 1034 12/18/20 2238 12/19/20 0533 12/19/20 1418 12/20/20 0827 12/23/20 0109  NA 138 137   137 137 137  --   --  137  --  139 136  K 4.3 4.3   4.3 3.8 4.6  --   --  4.3  --  4.0 3.6  CL 102 105   105  --  104  --   --  108  --  110 100  CO2 22  --   --  22  --   --  20*  --  21* 26  GLUCOSE 108* 105*   105*  --  83  --   --  107*  --  148* 103*  BUN 17 21*   21  --  16  --   --  16  --  16 9  CREATININE 1.32* 1.10   1.10  --  1.31*  --   --  1.16  --  1.02 0.94  CALCIUM 8.6*  --   --  8.0*  --   --  7.4*  --  7.9* 8.6*  MG  --   --   --   --  1.8 1.8 1.8 1.9  --  1.9  PHOS  --   --   --   --  3.0 3.2 2.9 3.4  --  4.3   GFR: Estimated Creatinine Clearance: 99.5 mL/min (by C-G formula based on SCr of 0.94 mg/dL). Liver Function Tests: Recent Labs  Lab 12/17/20 1310 12/18/20 0336 12/21/20 0829  AST 282* 174* 55*  ALT 376* 284*  147*  ALKPHOS 165* 129* 91  BILITOT 1.7* 1.1 0.7  PROT 6.9 5.6* 6.3*  ALBUMIN 2.8* 2.3* 2.3*   No results for input(s): LIPASE, AMYLASE in the last 168 hours. Recent Labs  Lab 12/18/20 0800  AMMONIA 24   Coagulation Profile: Recent Labs  Lab 12/17/20 1310  INR 1.5*   Cardiac Enzymes: No results for input(s): CKTOTAL, CKMB, CKMBINDEX, TROPONINI in the last 168 hours. BNP (last 3 results) No results for input(s): PROBNP in the last 8760 hours. HbA1C: No results for input(s): HGBA1C in the last 72 hours. CBG: Recent Labs  Lab 12/22/20 0814 12/22/20 1150 12/22/20 2029 12/23/20 0559 12/23/20 0725  GLUCAP 183* 79 223* 113* 160*   Lipid Profile: No results for input(s): CHOL, HDL, LDLCALC, TRIG, CHOLHDL, LDLDIRECT in the last 72 hours. Thyroid Function Tests: No results for input(s): TSH, T4TOTAL, FREET4, T3FREE, THYROIDAB in the last 72 hours. Anemia Panel: No results for input(s): VITAMINB12, FOLATE, FERRITIN, TIBC, IRON, RETICCTPCT in the last 72 hours. Sepsis Labs: Recent Labs  Lab 12/17/20 2155  LATICACIDVEN 1.1    Recent Results (from the past 240 hour(s))  Resp Panel by RT-PCR (Flu A&B, Covid) Nasopharyngeal Swab     Status: None   Collection Time: 12/17/20  2:07 PM   Specimen: Nasopharyngeal Swab; Nasopharyngeal(NP) swabs in vial transport medium  Result Value Ref Range Status   SARS Coronavirus 2 by RT PCR NEGATIVE NEGATIVE Final    Comment: (NOTE) SARS-CoV-2 target nucleic acids are NOT DETECTED.  The SARS-CoV-2 RNA is generally detectable in upper respiratory specimens during the acute phase of infection. The lowest concentration of SARS-CoV-2 viral copies this assay can detect is 138 copies/mL. A negative result does not preclude SARS-Cov-2 infection and should not be used as the sole basis for treatment or other patient management decisions. A negative result may occur with  improper specimen collection/handling, submission of specimen other than  nasopharyngeal swab, presence of viral mutation(s) within the areas targeted by this assay, and inadequate number of viral copies(<138 copies/mL). A negative result must be combined with clinical observations, patient history, and epidemiological information. The expected result is Negative.  Fact Sheet for Patients:  BloggerCourse.com  Fact Sheet for Healthcare Providers:  SeriousBroker.it  This test is no t yet approved or cleared by the [place] FDA and  has been authorized for detection and/or diagnosis of SARS-CoV-2 by FDA under an Emergency Use Authorization (EUA). This EUA will remain  in effect (meaning this test can be used) for the duration of the COVID-19 declaration under Section 564(b)(1) of the Act, 21 U.S.C.section 360bbb-3(b)(1), unless the authorization is terminated  or revoked sooner.       Influenza A by PCR NEGATIVE NEGATIVE Final   Influenza B by PCR NEGATIVE NEGATIVE Final    Comment: (NOTE) The Xpert Xpress SARS-CoV-2/FLU/RSV plus assay is intended as an aid in the diagnosis of influenza from Nasopharyngeal swab specimens and should not be used as a sole basis for treatment. Nasal washings and aspirates are unacceptable for Xpert Xpress SARS-CoV-2/FLU/RSV testing.  Fact Sheet for Patients: BloggerCourse.com  Fact Sheet for Healthcare Providers: SeriousBroker.it  This test is not yet approved or cleared by the [place] FDA and has been authorized for detection and/or diagnosis of SARS-CoV-2 by FDA under an Emergency Use Authorization (EUA). This EUA will remain in effect (meaning this test can be used) for the duration of the COVID-19 declaration under Section 564(b)(1) of the Act, 21 U.S.C. section 360bbb-3(b)(1), unless the authorization is terminated or revoked.  Performed at [facility], [address] [place], [name] [address]    MRSA PCR Screening     Status: None   Collection Time: 12/17/20  6:02 PM   Specimen: Nasal Mucosa; Nasopharyngeal  Result Value Ref Range Status   MRSA by PCR NEGATIVE NEGATIVE Final    Comment:        The GeneXpert MRSA Assay (FDA approved for NASAL specimens only), is one component of a comprehensive MRSA colonization surveillance program. It is not intended to diagnose MRSA infection nor to guide or monitor treatment for MRSA infections. Performed at [facility], [address] [place], [name] [address]          Radiology Studies: No results found.      Scheduled Meds:  (feeding supplement) PROSource Plus  30 mL Oral BID BM   aspirin  81 mg Oral Daily   atorvastatin  40 mg Oral Daily   carvedilol  6.25 mg Oral BID WC   Chlorhexidine Gluconate Cloth  6 each Topical Daily   clopidogrel  75 mg Oral Daily   divalproex  500 mg Oral Daily   feeding supplement  237 mL Oral BID BM   [START ON 12/24/2020] furosemide  40 mg Oral Daily   hydrALAZINE  25 mg Per Tube TID   isosorbide mononitrate  30 mg Oral Daily   multivitamin with minerals  1 tablet Oral Daily   polyethylene glycol  17 g Per Tube Daily   sacubitril-valsartan  1 tablet Oral BID   silver sulfADIAZINE   Topical BID   spironolactone  12.5 mg Oral Daily   Continuous Infusions:    LOS: 6 days    Time spent: 30 minutes     Dorcas Carrow, MD [facility] Pager [phone]

## 2020-12-23 NOTE — [place] FL2 (Signed)
[place] MEDICAID FL2 LEVEL OF CARE SCREENING TOOL     IDENTIFICATION  Patient Name: [name] Birthdate: September 01, 1966 Sex: male Admission Date (Current Location): 12/17/2020  [place] and [place] Number:    [id number] Facility and Address:  [facility], [address] [place], [name] [address]      Provider Number:    Attending Physician Name and Address:  Dorcas Carrow, MD  Relative Name and Phone Number:  none    Current Level of Care: SNF Recommended Level of Care: Skilled Nursing Facility Prior Approval Number:    Date Approved/Denied:   PASRR Number: pending  Discharge Plan: SNF    Current Diagnoses: Patient Active Problem List   Diagnosis Date Noted  . Acute on chronic combined systolic and diastolic CHF (congestive heart failure) (HCC) 12/20/2020  . Respiratory failure (HCC)   . Stroke (HCC) 12/17/2020  . 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    Orientation RESPIRATION BLADDER Height & Weight     Self,Time,Situation,Place  Normal Continent Weight: 187 lb 9.8 oz (85.1 kg) Height:  5\' 9"  (175.3 cm)  BEHAVIORAL SYMPTOMS/MOOD NEUROLOGICAL BOWEL NUTRITION STATUS      Continent Diet  AMBULATORY STATUS COMMUNICATION OF NEEDS Skin   Extensive Assist Verbally Normal                       Personal Care Assistance Level of Assistance  Bathing,Feeding,Dressing Bathing Assistance: Limited assistance Feeding assistance: Independent Dressing Assistance: Limited assistance     Functional Limitations Info  Sight,Hearing,Speech Sight Info: Adequate Hearing Info: Adequate Speech Info: Adequate    SPECIAL CARE FACTORS FREQUENCY  PT (By licensed PT),OT (By licensed OT)     PT Frequency: 5x per week OT Frequency: 5x per week            Contractures Contractures Info: Not present    Additional Factors Info  Code Status Code Status Info: full             Current Medications (12/23/2020):  This is the current  hospital active medication list Current Facility-Administered Medications  Medication Dose Route Frequency Provider Last Rate Last Admin  . (feeding supplement) PROSource Plus liquid 30 mL  30 mL Oral BID BM 12/25/2020, MD   30 mL at 12/22/20 1306  . acetaminophen (TYLENOL) tablet 650 mg  650 mg Oral Q4H PRN Deveshwar, 12/24/20, MD       Or  . acetaminophen (TYLENOL) 160 MG/5ML solution 650 mg  650 mg Per Tube Q4H PRN Deveshwar, Sanjeev, MD       Or  . acetaminophen (TYLENOL) suppository 650 mg  650 mg Rectal Q4H PRN Deveshwar, Sanjeev, MD      . aspirin chewable tablet 81 mg  81 mg Oral Daily Simonne Maffucci, MD   81 mg at 12/22/20 0933  . atorvastatin (LIPITOR) tablet 40 mg  40 mg Oral Daily 12/24/20, MD   40 mg at 12/22/20 0933  . carvedilol (COREG) tablet 6.25 mg  6.25 mg Oral BID WC 12/24/20, MD   6.25 mg at 12/22/20 1624  . Chlorhexidine Gluconate Cloth 2 % PADS 6 each  6 each Topical Daily 12/24/20, MD   6 each at 12/21/20 2153  . clopidogrel (PLAVIX) tablet 75 mg  75 mg Oral Daily Dagar, Anjali, MD   75 mg at 12/22/20 0933  . divalproex (DEPAKOTE ER) 24 hr tablet 500 mg  500 mg Oral Daily Dagar, 12/24/20, MD  500 mg at 12/22/20 0934  . docusate (COLACE) 50 MG/5ML liquid 100 mg  100 mg Oral Daily PRN Agarwala, Daleen Bo, MD      . feeding supplement (ENSURE ENLIVE / ENSURE PLUS) liquid 237 mL  237 mL Oral BID BM Dorcas Carrow, MD   237 mL at 12/22/20 1306  . furosemide (LASIX) injection 40 mg  40 mg Intravenous Daily [name], MD      . hydrALAZINE (APRESOLINE) tablet 25 mg  25 mg Per Tube TID Lynnell Catalan, MD   25 mg at 12/22/20 2137  . isosorbide mononitrate (IMDUR) 24 hr tablet 30 mg  30 mg Oral Daily Chrystie Nose, MD   30 mg at 12/22/20 0933  . LORazepam (ATIVAN) tablet 1 mg  1 mg Oral Q4H PRN Dorcas Carrow, MD   1 mg at 12/21/20 2142  . multivitamin with minerals tablet 1 tablet  1 tablet Oral Daily Dorcas Carrow, MD   1 tablet at 12/22/20 0934   . ondansetron (ZOFRAN) injection 4 mg  4 mg Intravenous Q6H PRN Kirby-Graham, Beather Arbour, NP      . phenol (CHLORASEPTIC) mouth spray 1 spray  1 spray Mouth/Throat PRN Zierle-Ghosh, Asia B, DO   1 spray at 12/21/20 0214  . polyethylene glycol (MIRALAX / GLYCOLAX) packet 17 g  17 g Per Tube Daily Raymon Mutton F, NP   17 g at 12/22/20 0933  . sacubitril-valsartan (ENTRESTO) 24-26 mg per tablet  1 tablet Oral BID Lynnell Catalan, MD   1 tablet at 12/22/20 2137  . silver sulfADIAZINE (SILVADENE) 1 % cream   Topical BID Micki Riley, MD   Given at 12/22/20 [phone]  . spironolactone (ALDACTONE) tablet 12.5 mg  12.5 mg Oral Daily [name], MD   12.5 mg at 12/22/20 1304     Discharge Medications: Please see discharge summary for a list of discharge medications.  Relevant Imaging Results:  Relevant Lab Results:   Additional Information [phone]  [place]erna [place], [name]

## 2020-12-23 NOTE — Progress Notes (Signed)
Pt took his morning medicines after third attempts of making him understand why he needs his medicines. Pt refused his 12 Pm vitals and morning nursing care. Pt is alert and oriented but certainly not seems to understand his care management and disease process. Pt is  not cooperative with the nursing staffs and continuously shows his disrespects through the foul languages.   Will continue to monitor the patient and continue to educate why he needs his medicine and treatment while he is in hospital.  Lonia Farber, RN

## 2020-12-23 NOTE — Progress Notes (Signed)
From nursing note patient refusing care. Refusing meds, removing IV and tele leads, verbally abusive to staff. Will keep on rounding list if patient stays in house and decides to cooperate with care tomorrow, no rounding charge placed today   Dina Rich MD

## 2020-12-23 NOTE — Plan of Care (Signed)
°  Problem: Education: Goal: Knowledge of General Education information will improve Description: Including pain rating scale, medication(s)/side effects and non-pharmacologic comfort measures Outcome: Progressing   Problem: Clinical Measurements: Goal: Diagnostic test results will improve Outcome: Progressing Goal: Respiratory complications will improve Outcome: Progressing Goal: Cardiovascular complication will be avoided Outcome: Progressing

## 2020-12-23 NOTE — Progress Notes (Signed)
Pt is refusing V/S & CBG at 12 AM, refusing his Coreg this morning. Pt is cursing & disrespectful to staff members. Purposely throwing garbage & urinal in the floor, peed in the bed even urinal at bedside. Pt is very impulsive. Removed multiple times his EKG leads & his IV.

## 2020-12-24 DIAGNOSIS — I639 Cerebral infarction, unspecified: Secondary | ICD-10-CM

## 2020-12-24 DIAGNOSIS — I5043 Acute on chronic combined systolic (congestive) and diastolic (congestive) heart failure: Secondary | ICD-10-CM | POA: Diagnosis not present

## 2020-12-24 DIAGNOSIS — I429 Cardiomyopathy, unspecified: Secondary | ICD-10-CM

## 2020-12-24 DIAGNOSIS — I2510 Atherosclerotic heart disease of native coronary artery without angina pectoris: Secondary | ICD-10-CM | POA: Diagnosis not present

## 2020-12-24 LAB — GLUCOSE, CAPILLARY
Glucose-Capillary: 117 mg/dL — ABNORMAL HIGH (ref 70–99)
Glucose-Capillary: 139 mg/dL — ABNORMAL HIGH (ref 70–99)
Glucose-Capillary: 183 mg/dL — ABNORMAL HIGH (ref 70–99)

## 2020-12-24 LAB — BASIC METABOLIC PANEL
Anion gap: 10 (ref 5–15)
BUN: 9 mg/dL (ref 6–20)
CO2: 25 mmol/L (ref 22–32)
Calcium: 8.8 mg/dL — ABNORMAL LOW (ref 8.9–10.3)
Chloride: 102 mmol/L (ref 98–111)
Creatinine, Ser: 0.99 mg/dL (ref 0.61–1.24)
GFR, Estimated: >60 mL/min (ref >60)
Glucose, Bld: 99 mg/dL (ref 70–99)
Potassium: 4 mmol/L (ref 3.5–5.1)
Sodium: 137 mmol/L (ref 135–145)

## 2020-12-24 LAB — MAGNESIUM: Magnesium: 1.8 mg/dL (ref 1.7–2.4)

## 2020-12-24 MED ORDER — MAGNESIUM OXIDE 400 (241.3 MG) MG PO TABS
400.0000 mg | ORAL_TABLET | Freq: Two times a day (BID) | ORAL | Status: DC
  Administered 2020-12-24 – 2021-01-24 (×43): 400 mg via ORAL
  Filled 2020-12-24 (×57): qty 1

## 2020-12-24 MED ORDER — MORPHINE SULFATE (PF) 2 MG/ML IV SOLN
0.5000 mg | Freq: Once | INTRAVENOUS | Status: AC
  Administered 2020-12-24: 0.5 mg via INTRAVENOUS
  Filled 2020-12-24: qty 1

## 2020-12-24 MED ORDER — TRAMADOL HCL 50 MG PO TABS
50.0000 mg | ORAL_TABLET | Freq: Four times a day (QID) | ORAL | Status: DC | PRN
  Administered 2020-12-24 – 2020-12-25 (×3): 50 mg via ORAL
  Filled 2020-12-24 (×3): qty 1

## 2020-12-24 NOTE — Progress Notes (Signed)
Patient refusing CBG checks.  

## 2020-12-24 NOTE — Progress Notes (Signed)
       RE:   [name]      Date of Birth:  1966-10-08     Date:   12/24/2020       To Whom It May Concern:  Please be advised that the above-named patient will require a short-term nursing home stay - anticipated 30 days or less for rehabilitation and strengthening.  The plan is for return home.

## 2020-12-24 NOTE — Progress Notes (Signed)
Progress Note  Patient Name: [name] Date of Encounter: 12/24/2020  [facility] Cardiologist: Rollene Rotunda, MD   Subjective   Awake and in no acute distress this AM. Difficult to direct, wants to get out of the hospital. Denies chest pain or shortness of breath.  Inpatient Medications    Scheduled Meds: . (feeding supplement) PROSource Plus  30 mL Oral BID BM  . aspirin  81 mg Oral Daily  . atorvastatin  40 mg Oral Daily  . carvedilol  6.25 mg Oral BID WC  . Chlorhexidine Gluconate Cloth  6 each Topical Daily  . clopidogrel  75 mg Oral Daily  . divalproex  500 mg Oral Daily  . feeding supplement  237 mL Oral BID BM  . furosemide  40 mg Oral Daily  . hydrALAZINE  25 mg Per Tube TID  . isosorbide mononitrate  30 mg Oral Daily  . magnesium oxide  400 mg Oral BID  . multivitamin with minerals  1 tablet Oral Daily  . polyethylene glycol  17 g Per Tube Daily  . sacubitril-valsartan  1 tablet Oral BID  . silver sulfADIAZINE   Topical BID  . spironolactone  12.5 mg Oral Daily   Continuous Infusions:  PRN Meds: acetaminophen **OR** acetaminophen (TYLENOL) oral liquid 160 mg/5 mL **OR** acetaminophen, docusate, LORazepam, ondansetron (ZOFRAN) IV, phenol   Vital Signs    Vitals:   12/23/20 1900 12/23/20 2000 12/24/20 0817 12/24/20 1054  BP: 131/84 131/84 100/64 123/77  Pulse: 87 87  90  Resp: 15 15    Temp: 98 F (36.7 C)  98.1 F (36.7 C)   TempSrc: Oral  Oral   SpO2: 98% 98%    Weight:      Height:        Intake/Output Summary (Last 24 hours) at 12/24/2020 1105 Last data filed at 12/24/2020 1023 Gross per 24 hour  Intake 480 ml  Output 2950 ml  Net -2470 ml   Last 3 Weights 12/23/2020 12/22/2020 12/21/2020  Weight (lbs) 187 lb 9.8 oz 196 lb 10.4 oz 208 lb 5.4 oz  Weight (kg) 85.1 kg 89.2 kg 94.5 kg      Telemetry    Has been off of telemetry, removed his leads - Personally Reviewed  ECG    No new since 12/17/20 - Personally Reviewed  Physical  Exam   GEN: No acute distress.   Neck: No JVD Cardiac: RRR, no murmurs, rubs, or gallops.  Respiratory: Clear to auscultation bilaterally GI: Soft, nontender, non-distended  MS: No edema; No deformity. Neuro:  Nonfocal  Psych: Normal affect   Labs    High Sensitivity Troponin:   Recent Labs  Lab 12/17/20 1558 12/17/20 1800 12/18/20 1034  TROPONINIHS 46* 45* 42*      Chemistry Recent Labs  Lab 12/17/20 1310 12/17/20 1312 12/18/20 0336 12/19/20 0533 12/20/20 0827 12/21/20 0829 12/23/20 0109 12/24/20 0433  NA 138   < > 137   < > 139  --  136 137  K 4.3   < > 4.6   < > 4.0  --  3.6 4.0  CL 102   < > 104   < > 110  --  100 102  CO2 22  --  22   < > 21*  --  26 25  GLUCOSE 108*   < > 83   < > 148*  --  103* 99  BUN 17   < > 16   < > 16  --  9 9  CREATININE 1.32*   < > 1.31*   < > 1.02  --  0.94 0.99  CALCIUM 8.6*  --  8.0*   < > 7.9*  --  8.6* 8.8*  PROT 6.9  --  5.6*  --   --  6.3*  --   --   ALBUMIN 2.8*  --  2.3*  --   --  2.3*  --   --   AST 282*  --  174*  --   --  55*  --   --   ALT 376*  --  284*  --   --  147*  --   --   ALKPHOS 165*  --  129*  --   --  91  --   --   BILITOT 1.7*  --  1.1  --   --  0.7  --   --   GFRNONAA 36*  --  37*   < > >60  --  >60 >60  ANIONGAP 14  --  11   < > 8  --  10 10   < > = values in this interval not displayed.     Hematology Recent Labs  Lab 12/20/20 0527 12/21/20 0100 12/22/20 0108  WBC 7.0 8.9 6.9  RBC 3.32* 3.76* 3.93*  HGB 11.1* 12.5* 12.7*  HCT 32.8* 36.9* 37.8*  MCV 98.8 98.1 96.2  MCH 33.4 33.2 32.3  MCHC 33.8 33.9 33.6  RDW 16.3* 16.2* 15.5  PLT 261 291 289    BNPNo results for input(s): BNP, PROBNP in the last 168 hours.   DDimer No results for input(s): DDIMER in the last 168 hours.   Radiology    No results found.  Cardiac Studies   Echo 12/18/20 1. Left ventricular ejection fraction, by estimation, is <20%. The left  ventricle has severely decreased function. The left ventricle demonstrates   global hypokinesis. The left ventricular internal cavity size was  moderately dilated. There is moderate  left ventricular hypertrophy. Left ventricular diastolic parameters are  consistent with Grade II diastolic dysfunction (pseudonormalization).  Elevated left atrial pressure.  2. Right ventricular systolic function is moderately reduced. The right  ventricular size is mildly enlarged.  3. Left atrial size was moderately dilated.  4. Right atrial size was moderately dilated.  5. The mitral valve is normal in structure. Trivial mitral valve  regurgitation. No evidence of mitral stenosis.  6. The aortic valve is tricuspid. Aortic valve regurgitation is not  visualized. Mild aortic valve sclerosis is present, with no evidence of  aortic valve stenosis.  7. The inferior vena cava is normal in size with greater than 50%  respiratory variability, suggesting right atrial pressure of 3 mmHg.   ECHO: 09/26/2020 1. Left ventricular ejection fraction, by estimation, is 40 to 45%. The  left ventricle has mildly decreased function. The left ventricle  demonstrates global hypokinesis. The left ventricular internal cavity size  was mildly dilated. There is mild concentric left ventricular hypertrophy. Left ventricular diastolic parameters are consistent with Grade II diastolic dysfunction (pseudonormalization).  2. Right ventricular systolic function is normal. The right ventricular  size is normal.  3. Left atrial size was moderately dilated.  4. Right atrial size was mildly dilated.  5. The mitral valve is normal in structure. Trivial mitral valve  regurgitation. No evidence of mitral stenosis.  6. The aortic valve is tricuspid. There is mild calcification of the  aortic valve. Aortic valve regurgitation is not visualized.  Mild aortic  valve sclerosis is present, with no evidence of aortic valve stenosis.  7. The inferior vena cava is normal in size with greater than 50%   respiratory variability, suggesting right atrial pressure of 3 mmHg.   CARDIAC CT: 04/25/2020 iMPRESSION: 1. Coronary artery disease with moderate to severe luminal narrowing at the right coronary, LAD and circumflex. Consider functional assessment.  2. Additional findings.   FINDINGS:  Morphologic elevation of the heart:   The left atrium, right atrium are mildly enlarged. The right ventricle is not enlarged. The left ventricle is enlarged and the left ventricular wall thickness measures 76mm. There is no evidence of filling defects within the left atrial appendage. All pulmonary veins drain into the left atrium. Tricuspid aortic valve with no calcifications. The mitral valve shows no calcifications. No abnormal pericardial effusion.   Evaluation of the thoracic aorta:   The ascending aorta measures 34 mm. The descending aorta measures 27 mm.   No acute aortic syndrome is identified.   Evaluation of the coronary arterial system:   Coronary Anatomy: Classic anatomy with right-sided dominance   1. Right coronary artery: Multiple calcified plaques are noted in the proximal mid and distal segments causing moderate to severe luminal narrowing.  2. Posterior descending artery:Nondiagnostic  3. Posterolateral branch:No significant stenosis.  4. Left main: Isolated calcifications causing mild luminal narrowing.  5. LAD: Multiple proximal mid and distal segments calcified plaques causing moderate to severe luminal narrowing.  6. Left circumflex:Multiple calcifications causing moderate to severeluminal narrowing.  Non-cardiac findings:   CARDIAC CATH: 10/07/2019 Impressions:   Diffuse atherosclerosis throughout all coronary distributions  Worst lesion is a 60-70% of the prox LCx  Degree of CM is greater than degree of CAD, suggesting a non-ischemic etiology   Patient Profile     55 y.o. male with PMH chronic systolic heart failure, CAD, polysubstance abuse who presented  with CVA. We are following for management of heart failure at the request of Dr. Jerral Ralph.  Assessment & Plan    CVA, R superior cerebellar cutoff on imaging consistent with acute ischemic stroke Additional areas of subacute infarct also noted -management per neurology and primary team  Chronic systolic heart failure -most recent EF <20%.  -prior EF has been as low as 15% with recovery to 40-45% -I personally reviewed his most recent echo. No evidence of LV thrombus, echo contrast used. -continue medical management with carvedilol, furosemide, imdur, hydralazine, sacubitril-valsartan, spironolactone -I am concerned about long term adherence to medications, especially BID and TID medications. 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 Blanchie Dessert initial recommendation for cath as an outpatient at least 1 month after recovery from stroke -continue DAPT per neuro, then continue aspirin and atorvastatin  [facility] will sign off.   Medication Recommendations:  Continue current medications as ordered in the inpatient orders Other recommendations (labs, testing, etc):  none Follow up as an outpatient:  We will arrange for outpatient follow up. He follows with Dr. [name].  For questions or updates, please contact [facility] Please consult [email or web address] for contact info under        Signed, Jodelle Red, MD  12/24/2020, 11:05 AM

## 2020-12-24 NOTE — Progress Notes (Signed)
As per MD request, arranged f/u in our office 2/25, appt placed on AVS.

## 2020-12-24 NOTE — Progress Notes (Signed)
PROGRESS NOTE    [name]  BOF:[id number] DOB: [birth year] DOA: 12/17/2020 PCP: [name]    Brief Narrative:  55 year old gentleman, initially with unknown identity brought to the ER after he complained of chest pain and became unresponsive with left-sided weakness and left gaze deviation. Was intubated in the ER. Apparently seen by bystanders when patient complained of chest pain and all of a sudden he became unresponsive. 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 1/17-1/20, admitted to ICU, extubated to 4 L oxygen on 1/20. 1/21, transferred to [facility] service, progressive bed. Patient has different MRN in our hospital system, he has frequent hospitalization and was recently admitted and discharged a week ago with diabetic toe ulcer. Review his previous records indicate that he is homeless, he is noncompliant, uses cocaine. He has a history of COPD, bipolar 1 disorder, known chronic heart failure with recent ejection fraction 40%, ischemic heart disease and previously declined PCI.  Significant diagnostics: 1/17 CT head: Left cerebellar hypodensity concerning for acute/subacute infarct. No intracranial hemorrhage. Small right parietal scalp hematoma 1/17 CTA head and neck: Non opacification of the left vertebral artery with retrograde filling of the distal left V4 segment. Bilateral carotid bifurcation atheromatous disease with 50% right and 30% left carotid bulb narrowing. Mild left P2 segment narrowing. 2D echo 1/18>>>EF <20%, hypokinesis, mod LVH, grade 2 diastolic dysfunction,  EEG 1/19>> This studyshowed evidence of epileptogenicity arising fromright>leftfrontal region. There is also moderatediffuse encephalopathy, nonspecific etiology butcould be secondary tosedation.No seizures were seen throughout the  recording.  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  Liver function tests normalized. A1c 6.4 EEG with diffuse encephalopathy Patient was treated on heparin.  Now on aspirin and Plavix for 3 weeks then aspirin alone.  Acute on chronic systolic and diastolic congestive heart failure: Recent echocardiogram with ejection fraction 40% New echocardiogram ejection fraction less than 20%. On aspirin, Lipitor, Entresto, aldactone, coreg and Imdur and oral lasix.  Kidney functions are stable.  Questionable seizure activity: No more seizure activities.  Neurology recommended no AEDs at this time.  Acute hypoxemic respiratory failure: Secondary to stroke.  Currently on room air.  Acute metabolic toxic encephalopathy: Multifactorial.  Stroke, drug use, acute respiratory failure and sedation.  Stabilizing. Patient has baseline behavior problems and intermittent agitations, aggressiveness towards the staff.  He endorsed some psychotic symptoms.  Seen by psychiatry and cleared for any lethality. Will need placement.  Acute liver failure: Improved.  Probably due to ischemic liver.  Cocaine use: Counseled to quit.  Bipolar disorder/homelessness: Poor compliance. Homelessness.  Consulted psychiatry.  Denies any active suicidal or homicidal ideation.  No hallucinations or delusions.  Continue mobilizing.  Referred to SNF. Patient is throwing away his telemetry and taking off his IV lines, discontinue telemetry.  Transfer to MedSurg bed. Transfer to a skilled nursing facility if available.   DVT prophylaxis: Aspirin and  Plavix.   Code Status: Full code Family Communication: None.  No family at bedside.  When asked about it, he says not to mess up  with them. Number for his good friend is nonexistent. Disposition Plan: Status is: Inpatient  Remains inpatient appropriate because:Inpatient level of care appropriate due to severity of illness   Dispo: The patient is from: Home              Anticipated d/c is to: SNF.              Anticipated d/c date is:  When bed available.    Patient currently is medically stable to transfer to skilled level of care when available.    Consultants:   PCCM  Cardiology  Palliative  Psychiatry  Procedures:   None  Antimicrobials:   None   Subjective: Patient was seen and examined.  Complains of toe pain.  Frustrated with right-sided weakness.  He wants to walk.    Objective: Vitals:   12/23/20 1900 12/23/20 2000 12/24/20 0817 12/24/20 1054  BP: 131/84 131/84 100/64 123/77  Pulse: 87 87  90  Resp: 15 15    Temp: 98 F (36.7 C)  98.1 F (36.7 C)   TempSrc: Oral  Oral   SpO2: 98% 98%    Weight:      Height:        Intake/Output Summary (Last 24 hours) at 12/24/2020 1140 Last data filed at 12/24/2020 1023 Gross per 24 hour  Intake 480 ml  Output 2950 ml  Net -2470 ml   Filed Weights   12/21/20 0425 12/22/20 0308 12/23/20 0500  Weight: 94.5 kg 89.2 kg 85.1 kg    Examination:  General exam: Appears calm but chronically sick looking, anxious, disheveled. Respiratory system: Clear to auscultation. Respiratory effort normal.  Cardiovascular system: S1 & S2 heard, regular.  No murmur. Gastrointestinal system: Abdomen is nondistended, soft and nontender. No organomegaly or masses felt. Normal bowel sounds heard. Central nervous system: Alert and oriented x3. Cranial nerves with no obvious deficits. Right upper extremity 3-4/5.  Rest of the extremities with normal power. Psychiatry: Judgement and insight appear compromised.  He is anxious.  He  has flat affect. Denies any delusions or hallucinations. Discoloration of both legs. Patient has both great toe ulceration at the bottom.  Dry dressing.   Data Reviewed: I have personally reviewed following labs and imaging studies  CBC: Recent Labs  Lab 12/17/20 1310 12/17/20 1312 12/18/20 0336 12/19/20 0533 12/20/20 0527 12/21/20 0100 12/22/20 0108  WBC 9.8  --  11.8* 7.8 7.0 8.9 6.9  NEUTROABS 4.9  --  8.9*  --   --   --   --   HGB 15.0   < > 13.6 11.6* 11.1* 12.5* 12.7*  HCT 45.4   < > 42.0 36.5* 32.8* 36.9* 37.8*  MCV 98.9  --  100.0 100.8* 98.8 98.1 96.2  PLT 396  --  317 272 261 291 289   < > = values in this interval not displayed.   Basic Metabolic Panel: Recent Labs  Lab 12/18/20 0336 12/18/20 1034 12/18/20 1034 12/18/20 2238 12/19/20 0533 12/19/20 1418 12/20/20 0827 12/23/20 0109 12/24/20 0433  NA 137  --   --   --  137  --  139 136 137  K 4.6  --   --   --  4.3  --  4.0 3.6 4.0  CL 104  --   --   --  108  --  110 100 102  CO2 22  --   --   --  20*  --  21* 26 25  GLUCOSE 83  --   --   --  107*  --  148* 103* 99  BUN 16  --   --   --  16  --  16 9 9   CREATININE 1.31*  --   --   --  1.16  --  1.02 0.94 0.99  CALCIUM 8.0*  --   --   --  7.4*  --  7.9* 8.6* 8.8*  MG  --  1.8   < > 1.8 1.8 1.9  --  1.9 1.8  PHOS  --  3.0  --  3.2 2.9 3.4  --  4.3  --    < > = values in this interval not displayed.   GFR: Estimated Creatinine Clearance: 94.4 mL/min (by C-G formula based on SCr of 0.99 mg/dL). Liver Function Tests: Recent Labs  Lab 12/17/20 1310 12/18/20 0336 12/21/20 0829  AST 282* 174* 55*  ALT 376* 284* 147*  ALKPHOS 165* 129* 91  BILITOT 1.7* 1.1 0.7  PROT 6.9 5.6* 6.3*  ALBUMIN 2.8* 2.3* 2.3*   No results for input(s): LIPASE, AMYLASE in the last 168 hours. Recent Labs  Lab 12/18/20 0800  AMMONIA 24   Coagulation Profile: Recent Labs  Lab 12/17/20 1310  INR 1.5*   Cardiac Enzymes: No results for input(s): CKTOTAL, CKMB,  CKMBINDEX, TROPONINI in the last 168 hours. BNP (last 3 results) No results for input(s): PROBNP in the last 8760 hours. HbA1C: No results for input(s): HGBA1C in the last 72 hours. CBG: Recent Labs  Lab 12/23/20 1135 12/23/20 1558 12/23/20 2004 12/24/20 0025 12/24/20 0820  GLUCAP 110* 103* 145* 139* 183*   Lipid Profile: No results for input(s): CHOL, HDL, LDLCALC, TRIG, CHOLHDL, LDLDIRECT in the last 72 hours. Thyroid Function Tests: No results for input(s): TSH, T4TOTAL, FREET4, T3FREE, THYROIDAB in the last 72 hours. Anemia Panel: No results for input(s): VITAMINB12, FOLATE, FERRITIN, TIBC, IRON, RETICCTPCT in the last 72 hours. Sepsis Labs: Recent Labs  Lab 12/17/20 2155  LATICACIDVEN 1.1    Recent Results (from the past 240 hour(s))  Resp Panel by RT-PCR (Flu A&B, Covid) Nasopharyngeal Swab     Status: None   Collection Time: 12/17/20  2:07 PM   Specimen: Nasopharyngeal Swab; Nasopharyngeal(NP) swabs in vial transport medium  Result Value Ref Range Status   SARS Coronavirus 2 by RT PCR NEGATIVE NEGATIVE Final    Comment: (NOTE) SARS-CoV-2 target nucleic acids are NOT DETECTED.  The SARS-CoV-2 RNA is generally detectable in upper respiratory specimens during the acute phase of infection. The lowest concentration of SARS-CoV-2 viral copies this assay can detect is 138 copies/mL. A negative result does not preclude SARS-Cov-2 infection and should not be used as the sole basis for treatment or other patient management decisions. A negative result may occur with  improper specimen collection/handling, submission of specimen other than nasopharyngeal swab, presence of viral mutation(s) within the areas targeted by this assay, and inadequate number of viral copies(<138 copies/mL). A negative result must be combined with clinical observations, patient history, and epidemiological information. The expected result is Negative.  Fact Sheet for Patients:   BloggerCourse.com  Fact Sheet for Healthcare Providers:  SeriousBroker.it  This test is no t yet approved or cleared by the [place] FDA and  has been authorized for detection and/or diagnosis of SARS-CoV-2 by FDA under an Emergency Use Authorization (EUA). This EUA will remain  in effect (meaning this test can be used) for the duration of the COVID-19 declaration under Section 564(b)(1) of the Act, 21 U.S.C.section 360bbb-3(b)(1), unless the  authorization is terminated  or revoked sooner.       Influenza A by PCR NEGATIVE NEGATIVE Final   Influenza B by PCR NEGATIVE NEGATIVE Final    Comment: (NOTE) The Xpert Xpress SARS-CoV-2/FLU/RSV plus assay is intended as an aid in the diagnosis of influenza from Nasopharyngeal swab specimens and should not be used as a sole basis for treatment. Nasal washings and aspirates are unacceptable for Xpert Xpress SARS-CoV-2/FLU/RSV testing.  Fact Sheet for Patients: BloggerCourse.com  Fact Sheet for Healthcare Providers: SeriousBroker.it  This test is not yet approved or cleared by the [place] FDA and has been authorized for detection and/or diagnosis of SARS-CoV-2 by FDA under an Emergency Use Authorization (EUA). This EUA will remain in effect (meaning this test can be used) for the duration of the COVID-19 declaration under Section 564(b)(1) of the Act, 21 U.S.C. section 360bbb-3(b)(1), unless the authorization is terminated or revoked.  Performed at [facility], [address] [place], [name] [address]   MRSA PCR Screening     Status: None   Collection Time: 12/17/20  6:02 PM   Specimen: Nasal Mucosa; Nasopharyngeal  Result Value Ref Range Status   MRSA by PCR NEGATIVE NEGATIVE Final    Comment:        The GeneXpert MRSA Assay (FDA approved for NASAL specimens only), is one component of a comprehensive MRSA  colonization surveillance program. It is not intended to diagnose MRSA infection nor to guide or monitor treatment for MRSA infections. Performed at [facility], [address], [place], [name] [address]          Radiology Studies: No results found.      Scheduled Meds: . (feeding supplement) PROSource Plus  30 mL Oral BID BM  . aspirin  81 mg Oral Daily  . atorvastatin  40 mg Oral Daily  . carvedilol  6.25 mg Oral BID WC  . Chlorhexidine Gluconate Cloth  6 each Topical Daily  . clopidogrel  75 mg Oral Daily  . divalproex  500 mg Oral Daily  . feeding supplement  237 mL Oral BID BM  . furosemide  40 mg Oral Daily  . hydrALAZINE  25 mg Per Tube TID  . isosorbide mononitrate  30 mg Oral Daily  . magnesium oxide  400 mg Oral BID  . multivitamin with minerals  1 tablet Oral Daily  . polyethylene glycol  17 g Per Tube Daily  . sacubitril-valsartan  1 tablet Oral BID  . silver sulfADIAZINE   Topical BID  . spironolactone  12.5 mg Oral Daily   Continuous Infusions:    LOS: 7 days    Time spent: 30 minutes     Dorcas Carrow, MD [facility] Pager [phone]

## 2020-12-24 NOTE — Progress Notes (Signed)
NT tried to go into patients room to get vital signs and patient refused.

## 2020-12-24 NOTE — Progress Notes (Signed)
Patient refusing dressing change on foot at this time.

## 2020-12-24 NOTE — Progress Notes (Signed)
Physical Therapy Treatment Patient Details Name: [name] MRN: [id number] DOB: [birth year] Today's Date: 12/24/2020    History of Present Illness Homeless 55 yo male found down with c/o CP by bystanders. 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 Extubated 1/20.    PT Comments    Pt continues with impaired cognition and function. 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 Pt with stronger lean to the R but unable to sequence UEs to help support self or control body. Attempted standing x3, unable to stand with walker, used bilat HHA with support under shoulder to provided stability at trunk. Pt unable to attempt to march in place or step due to impaired comprehension and sequencing. Acute PT to cont to follow.    Follow Up Recommendations  SNF;Supervision/Assistance - 24 hour     Equipment Recommendations  Other (comment) (TBA)    Recommendations for Other Services       Precautions / Restrictions Precautions Precautions: Fall Restrictions Weight Bearing Restrictions: No    Mobility  Bed Mobility Overal bed mobility: Needs Assistance Bed Mobility: Rolling;Sidelying to Sit;Sit to Sidelying Rolling: Modified independent (Device/Increase time) Sidelying to sit: Mod assist;HOB elevated     Sit to sidelying: Mod assist;+2 for physical assistance General bed mobility comments: max directional cues, upon sitting up pt with truncal ataxia and significant swaying in all directions with significant R lateral lean requiring modA from PT and tech  Transfers Overall transfer level: Needs assistance Equipment used: 2 person hand held assist Transfers: Sit to/from Stand Sit to Stand: Max assist;+2 physical assistance         General transfer comment:  pt with significant truncal ataxia, inability to achieve terminal knee extension in standing on R, requiring maxA at gait belt and under arm to assist with trunk stabilization, unable to attempt stepping  Ambulation/Gait             General Gait Details: unable today   Stairs             Wheelchair Mobility    Modified Rankin (Stroke Patients Only) Modified Rankin (Stroke Patients Only) Pre-Morbid Rankin Score: No symptoms Modified Rankin: Severe disability     Balance Overall balance assessment: Needs assistance Sitting-balance support: Bilateral upper extremity supported;Feet supported Sitting balance-Leahy Scale: Poor Sitting balance - Comments: swaying in all directions but ultimately significant R lateral lean   Standing balance support: Bilateral upper extremity supported Standing balance-Leahy Scale: Zero Standing balance comment: dependent on assistx2                            Cognition Arousal/Alertness: Awake/alert Behavior During Therapy: Impulsive;Restless Overall Cognitive Status: No family/caregiver present to determine baseline cognitive functioning                                 General Comments: pt hyperfocused on tasks he feels is most important ie. had to call for dinner when PT was in there despite not having finished lunch, pt with poor recall of precautions and education on type of stroke he had. Pt with poor insight to deficits and safety      Exercises      General Comments General  comments (skin integrity, edema, etc.): R inattention, VSS, pt with dressings over bilat great toes      Pertinent Vitals/Pain Pain Assessment: No/denies pain    Home Living                      Prior Function            PT Goals (current goals can now be found in the care plan section) Acute Rehab PT Goals PT Goal Formulation: With patient Time For Goal Achievement: 01/03/21 Potential to Achieve Goals:  Good Progress towards PT goals: Progressing toward goals    Frequency    Min 3X/week      PT Plan Current plan remains appropriate    Co-evaluation              AM-PAC PT "6 Clicks" Mobility   Outcome Measure  Help needed turning from your back to your side while in a flat bed without using bedrails?: A Lot Help needed moving from lying on your back to sitting on the side of a flat bed without using bedrails?: A Lot Help needed moving to and from a bed to a chair (including a wheelchair)?: A Lot Help needed standing up from a chair using your arms (e.g., wheelchair or bedside chair)?: A Lot Help needed to walk in hospital room?: A Lot Help needed climbing 3-5 steps with a railing? : A Lot 6 Click Score: 12    End of Session   Activity Tolerance: Patient tolerated treatment well;Other (comment) Patient left: in bed;with call bell/phone within reach;with bed alarm set Nurse Communication: Mobility status PT Visit Diagnosis: Unsteadiness on feet (R26.81);Other symptoms and signs involving the nervous system (R29.898);Other abnormalities of gait and mobility (R26.89)     Time: 4944-9675 PT Time Calculation (min) (ACUTE ONLY): 25 min  Charges:  $Therapeutic Activity: 8-22 mins $Neuromuscular Re-education: 8-22 mins                     Lewis Shock, PT, DPT Acute Rehabilitation Services Pager #: [phone] Office #: [phone]    Iona Hansen 12/24/2020, 2:16 PM

## 2020-12-24 NOTE — Progress Notes (Signed)
Pt c/o 9/10 pain to bilateral feet, great toes.  Only Tylenol ordered for pain.  Pt states he is allergic to Tylenol and that it isnt strong enough.  Text page to Dr. Toniann Fail.

## 2020-12-24 NOTE — Plan of Care (Signed)
?  Problem: Clinical Measurements: ?Goal: Ability to maintain clinical measurements within normal limits will improve ?Outcome: Progressing ?Goal: Will remain free from infection ?Outcome: Progressing ?Goal: Diagnostic test results will improve ?Outcome: Progressing ?  ?

## 2020-12-25 ENCOUNTER — Encounter: Payer: Self-pay | Admitting: Cardiology

## 2020-12-25 ENCOUNTER — Inpatient Hospital Stay (HOSPITAL_COMMUNITY): Payer: Medicaid Other

## 2020-12-25 DIAGNOSIS — I2510 Atherosclerotic heart disease of native coronary artery without angina pectoris: Secondary | ICD-10-CM | POA: Diagnosis not present

## 2020-12-25 DIAGNOSIS — I5043 Acute on chronic combined systolic (congestive) and diastolic (congestive) heart failure: Secondary | ICD-10-CM | POA: Diagnosis not present

## 2020-12-25 LAB — GLUCOSE, CAPILLARY
Glucose-Capillary: 176 mg/dL — ABNORMAL HIGH (ref 70–99)
Glucose-Capillary: 120 mg/dL — ABNORMAL HIGH (ref 70–99)

## 2020-12-25 IMAGING — CT CT HEAD W/O CM
1 of 2 series · 12 of 30 positions shown, 15 images · non-contrast
Comparison: CT head without contrast [DATE]. MR head
[DATE].

CLINICAL DATA: Stroke. Persistent or worsening mental status
change.

EXAM:
CT HEAD WITHOUT CONTRAST
TECHNIQUE: Contiguous axial images were obtained from the base of the skull
through the vertex without intravenous contrast.

[Series 8: ax head · axial · 0.36mm/px · z∈[+50,+187]mm · 12 of 77 slices shown, 15 images]
[im 4/77  brain]
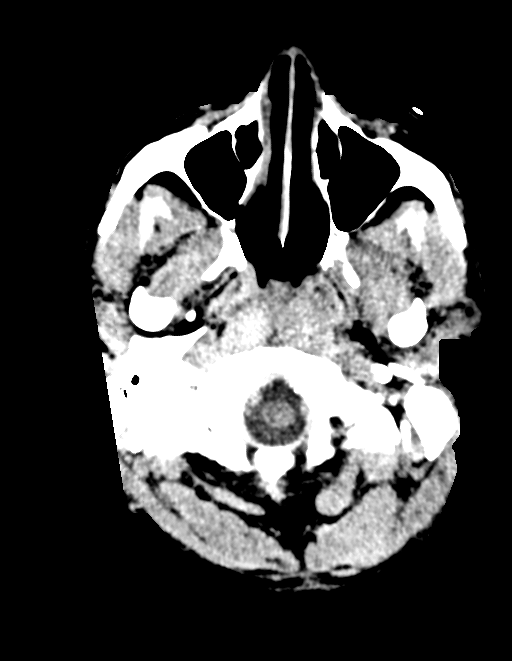
[im 4/77  bone]
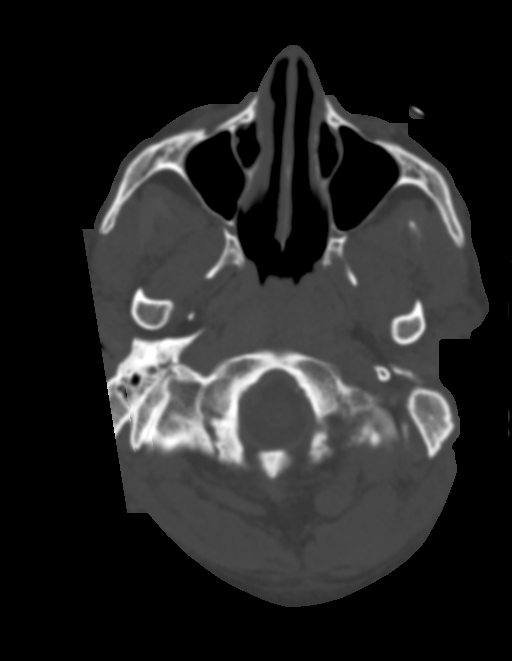
[im 12/77  brain]
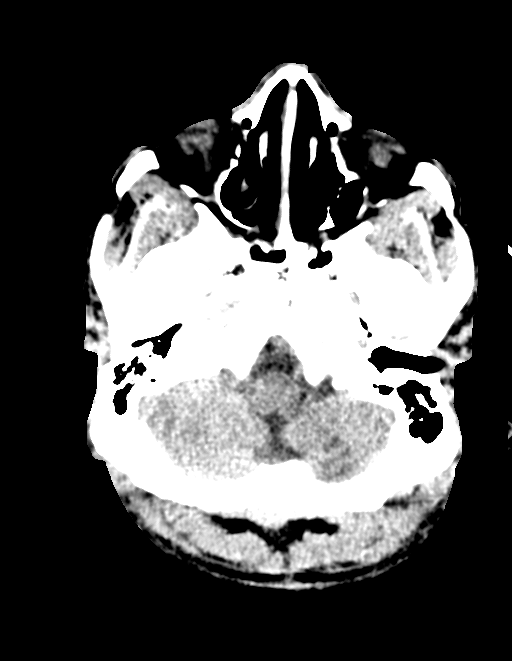
[im 16/77  brain]
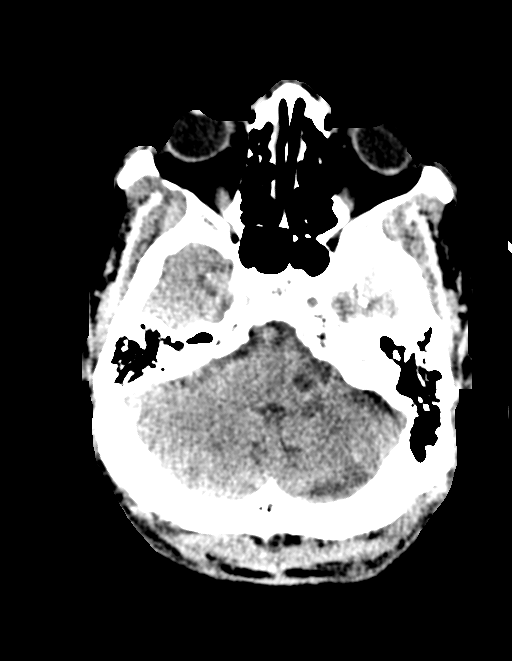
[im 23/77  brain]
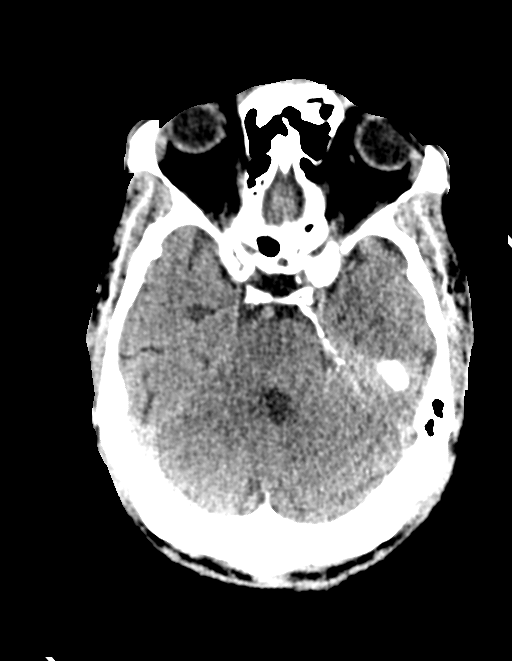
[im 31/77  brain]
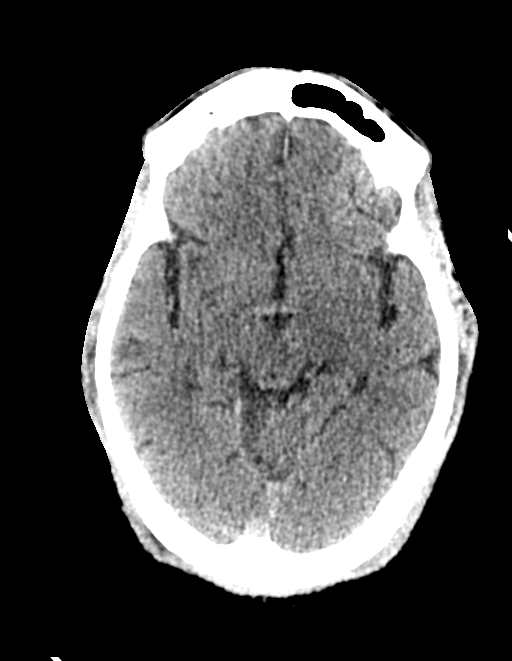
[im 31/77  bone]
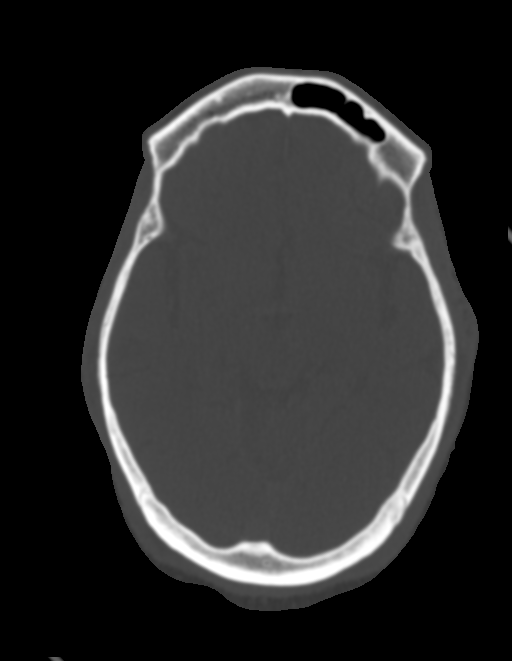
[im 35/77  brain]
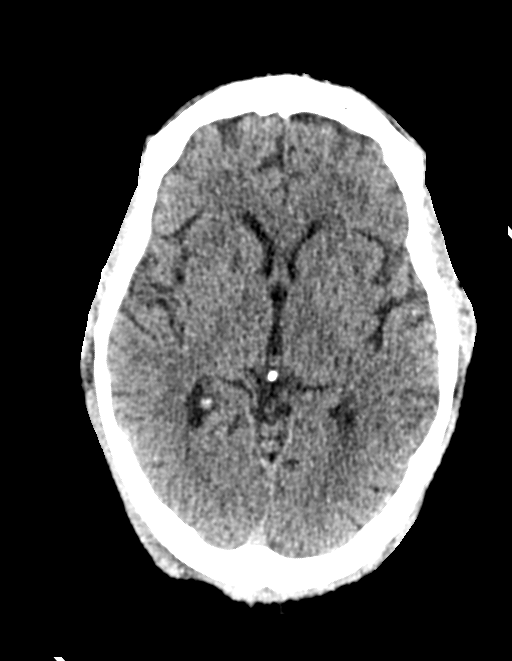
[im 42/77  brain]
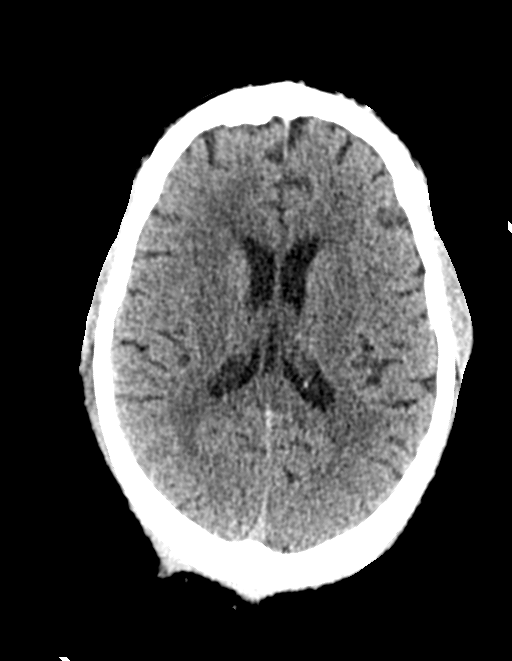
[im 46/77  brain]
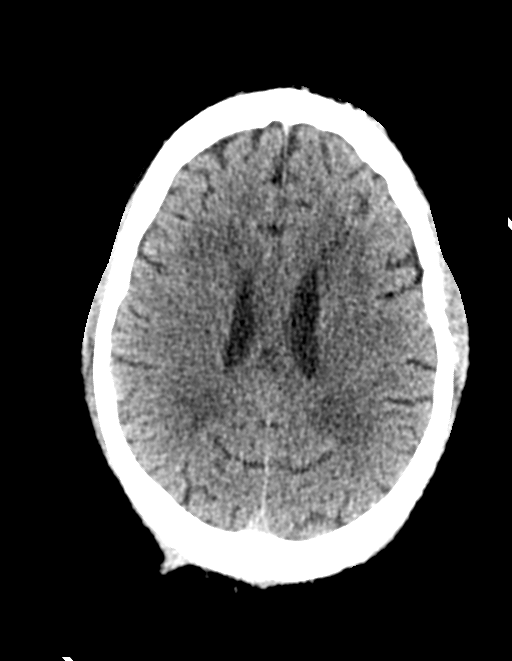
[im 54/77  brain]
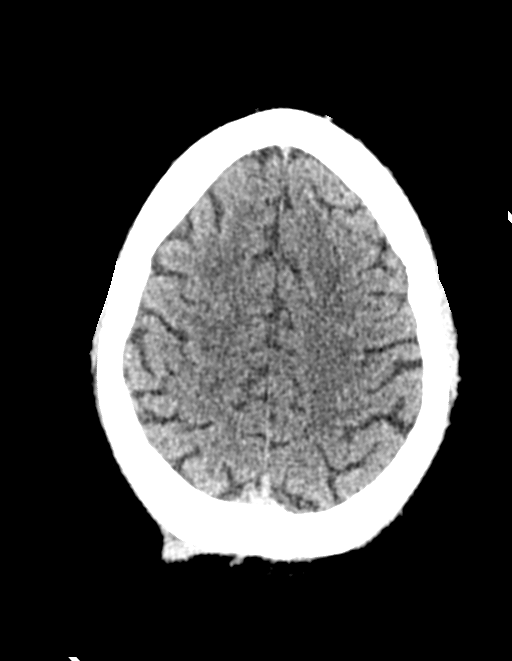
[im 54/77  bone]
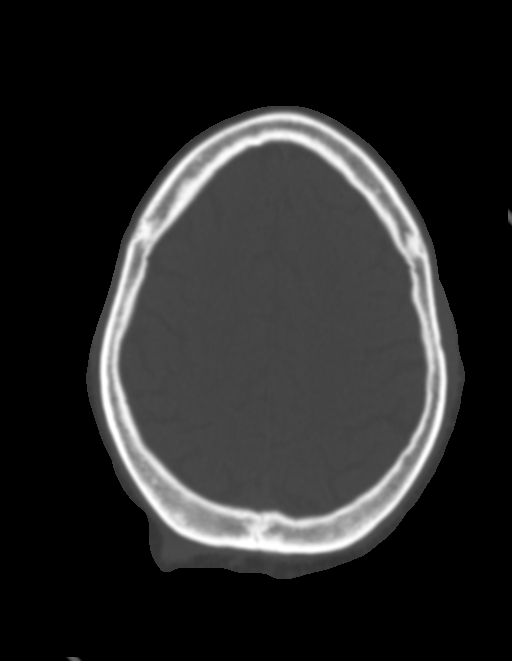
[im 61/77  brain]
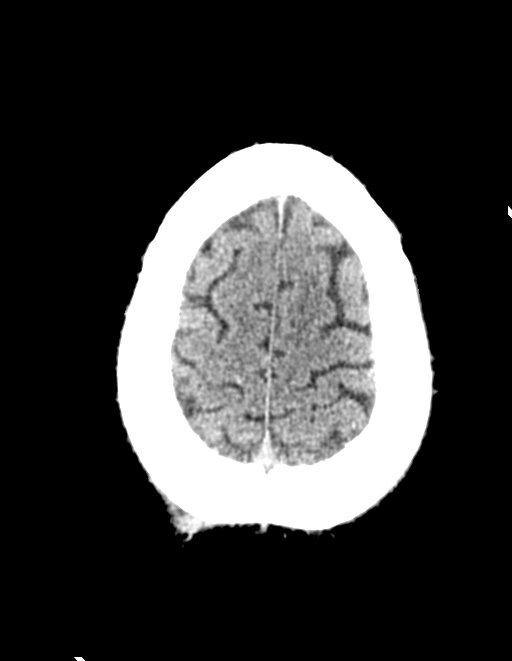
[im 65/77  brain]
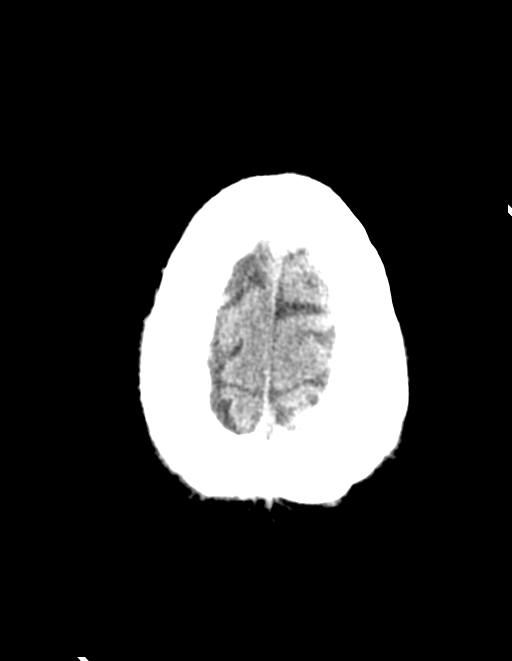
[im 73/77  brain]
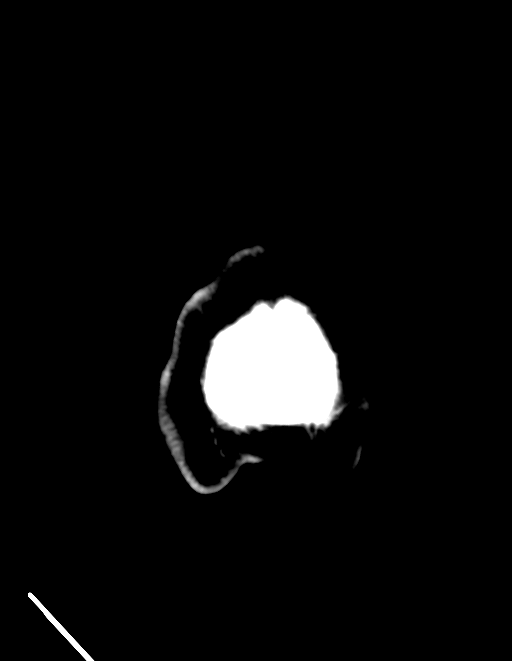

[12 of 30 positions shown; findings below may reference images not displayed]

FINDINGS: Brain: The right cerebellar infarct is beginning to normalize and is
less distinct than on the prior exams. No new or progressive infarct
is present. No mass effect is present. More remote left inferior
cerebellar infarct is stable. No acute supratentorial infarct is
present. No hemorrhage or mass lesion is present. The ventricles are
of normal size. No significant extraaxial fluid collection is
present.

Vascular: Minimal atherosclerotic calcifications are present within
the cavernous internal carotid arteries bilaterally. No hyperdense
vessel is present.

Skull: Calvarium is intact. No focal lytic or blastic lesions are
present. No significant extracranial soft tissue lesion is present.

Sinuses/Orbits: The paranasal sinuses and mastoid air cells are
clear. Remote left orbital blowout fracture is again seen. Globes
and orbits are within normal limits.
IMPRESSION: 1. Expected evolution of right cerebellar infarct.
2. Stable remote left inferior cerebellar infarct.
3. No acute intracranial abnormality or other significant interval
change.

## 2020-12-25 MED ORDER — HYDRALAZINE HCL 25 MG PO TABS
25.0000 mg | ORAL_TABLET | Freq: Three times a day (TID) | ORAL | Status: DC
Start: 2020-12-25 — End: 2021-01-04
  Administered 2020-12-26 – 2021-01-03 (×12): 25 mg via ORAL
  Filled 2020-12-25 (×19): qty 1

## 2020-12-25 NOTE — TOC Progression Note (Signed)
Transition of Care [facility]) - Progression Note    Patient Details  Name: [name] MRN: [id number] Date of Birth: 1966/08/25  Transition of [facility]) CM/SW Contact  Erin Sons, [name] Phone Number: 12/25/2020, 4:44 PM  Clinical Narrative:     The rest of clinicals uploaded to [facility].   Pt has no new bed offers.   Expected Discharge Plan: Skilled Nursing Facility Barriers to Discharge: Financial Resources,Homeless with medical needs  Expected Discharge Plan and Services Expected Discharge Plan: Skilled Nursing Facility     Post Acute Care Choice: Skilled Nursing Facility Living arrangements for the past 2 months: Homeless                                       Social Determinants of Health (SDOH) Interventions    Readmission Risk Interventions No flowsheet data found.

## 2020-12-25 NOTE — Progress Notes (Signed)
There were several meds not given, cbg's not done and vitals not documented due to patient refusing.  He has bed alarm on due to not wanting to use walker or have any assistance walking to bathroom, patient is very unsteady on his feet without assistance, but refuses to use walker, I have placed floor mats down and advised dr ghimire of patient's behaviors

## 2020-12-25 NOTE — Progress Notes (Signed)
Occupational Therapy Treatment Patient Details Name: [name] MRN: [id number] DOB: 09-13-1966 Today's Date: 12/25/2020    History of present illness Homeless 55 yo male found down with c/o CP by bystanders. 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 Extubated 1/20.   OT comments  Pt continues to present with impaired balance, impulsivity, decreased safety awareness, and  R sided inattention/ weakness. Pt able to progress OOB to recliner this session with stedy. MIN A +2 for sit<>stand from EOB. Pt transferred to recliner via sara stedy, once pt in recliner pt reports feeling hot and impulsively removing blankets, and BP cuff. Pt with erratic, ataxic BUE movements from recliner with pt reaching out for unknown objects. Bp checked from chair 109/97. RN enter to assess pts behavior and decided to return pt back to bed for safety, MOD A to sit<>stand from recliner with stedy. Pt returned to supine with MAX A with pt presenting with same odd behavior, BP from supine 117/85. Pt able to state he is in the hospital but unable to verbalize why he is here. Pt would continue to benefit from skilled occupational therapy while admitted and after d/c to address the below listed limitations in order to improve overall functional mobility and facilitate independence with BADL participation. DC plan remains appropriate, will follow acutely per POC.     Follow Up Recommendations  SNF;Supervision/Assistance - 24 hour    Equipment Recommendations  Other (comment) (TBD next venue)    Recommendations for Other Services      Precautions / Restrictions Precautions Precautions: Fall Precaution Comments: has been agressive at times, can quickly become agitated Restrictions Weight Bearing Restrictions: No       Mobility Bed  Mobility Overal bed mobility: Needs Assistance Bed Mobility: Rolling;Sidelying to Sit;Sit to Sidelying Rolling: Modified independent (Device/Increase time) Sidelying to sit: Mod assist;HOB elevated     Sit to sidelying: Max assist General bed mobility comments: pt recieved in sidelying needing MOD A to elevate trunk into sitting however pt with good management of BLEs with to able to progress off EOB. pt requried MAX A however to return to supine d/t impulsivty  Transfers Overall transfer level: Needs assistance   Transfers: Sit to/from Stand;Stand Pivot Transfers Sit to Stand: Min assist;Mod assist;+2 physical assistance Stand pivot transfers: Total assist (stedy)       General transfer comment: pt sit<>stand from EOB with stedy with MIN A +2 needing cues for hand placment, however from chair pt required MOD A +2 to power up d/t fatigue. pt continues to prsent with truncal and BUE ataxia during transfers    Balance Overall balance assessment: Needs assistance Sitting-balance support: Feet supported;No upper extremity supported Sitting balance-Leahy Scale: Fair Sitting balance - Comments: pt able to sit EOB with no UE support with minguard, pt with come truncal ataxia initially with sitting balance but improved as session progressed   Standing balance support: Bilateral upper extremity supported Standing balance-Leahy Scale: Poor Standing balance comment: reliant on BUE support                           ADL either performed or assessed with clinical judgement   ADL Overall ADL's : Needs assistance/impaired  Lower Body Dressing: Total assistance;Bed level Lower Body Dressing Details (indicate cue type and reason): total A from supine to don socks Toilet Transfer: Minimal assistance;+2 for physical assistance;Moderate assistance Toilet Transfer Details (indicate cue type and reason): simulated via functional mobility with pt able to stand to  stedy with MIN A+2, MOD A +2 to stand up from recliner to stedy.         Functional mobility during ADLs: Minimal assistance;Moderate assistance;+2 for physical assistance;+2 for safety/equipment;Cueing for safety;Cueing for sequencing General ADL Comments: pt continues to present with impaired balance, decreased safety awareness, and  R sided inattention/ weakness     Vision   Vision Assessment?: Vision impaired- to be further tested in functional context Additional Comments: right inattention and resistance to education on deficit   Perception     Praxis      Cognition Arousal/Alertness: Awake/alert Behavior During Therapy: Impulsive;Restless Overall Cognitive Status: Impaired/Different from baseline Area of Impairment: Attention;Following commands;Safety/judgement;Awareness;Problem solving                   Current Attention Level: Focused   Following Commands: Follows one step commands with increased time;Follows multi-step commands inconsistently Safety/Judgement: Decreased awareness of safety;Decreased awareness of deficits Awareness: Intellectual Problem Solving: Slow processing;Decreased initiation;Requires verbal cues General Comments: pt alerted and oriented but once pt in recliner pt presents with ataxic UB movements and seems to be disorineted as pt states "breathe" and impulsively  removed BP cuff from pts arm suddenly becoming agitated. Pt noted to keep eyes closed and not following commands, however pts BP WFL. returned pt to bed for safety at pt continuing to impulsively reach out for objects not present. Once pt back in bed pt presenting with the same behavior stating, "lets do more therapy"even though pt in no state to participate in therapy. pt also noted to quickly become agitated if you ask him too many questions.        Exercises Other Exercises Other Exercises: ankles pumps from EOB BLES x5 reps Other Exercises: seated marches from EOB x5 reps  BLEs Other Exercises: LAQ from EOB x5 reps BLEs   Shoulder Instructions       General Comments R inattention, pt reports dizziness once in recliner BP 109/97,returned pt back to supine as pt reports symptoms were not resolving BP from supine 117/85.    Pertinent Vitals/ Pain       Pain Assessment: Faces Faces Pain Scale: No hurt  Home Living                                          Prior Functioning/Environment              Frequency  Min 2X/week        Progress Toward Goals  OT Goals(current goals can now be found in the care plan section)  Progress towards OT goals: Progressing toward goals  Acute Rehab OT Goals OT Goal Formulation: With patient Time For Goal Achievement: 01/03/21 Potential to Achieve Goals: Good  Plan Discharge plan remains appropriate;Frequency remains appropriate    Co-evaluation                 AM-PAC OT "6 Clicks" Daily Activity     Outcome Measure   Help from another person eating meals?: A Little Help from another person taking care of personal grooming?: A Little Help from another person  toileting, which includes using toliet, bedpan, or urinal?: A Lot Help from another person bathing (including washing, rinsing, drying)?: A Lot Help from another person to put on and taking off regular upper body clothing?: A Lot Help from another person to put on and taking off regular lower body clothing?: Total 6 Click Score: 13    End of Session Equipment Utilized During Treatment: Gait belt;Other (comment) (stedy)  OT Visit Diagnosis: Unsteadiness on feet (R26.81);Other abnormalities of gait and mobility (R26.89);Muscle weakness (generalized) (M62.81);Other symptoms and signs involving cognitive function;Hemiplegia and hemiparesis Hemiplegia - Right/Left: Right Hemiplegia - dominant/non-dominant: Dominant Hemiplegia - caused by: Cerebral infarction   Activity Tolerance Patient tolerated treatment well;Treatment  limited secondary to agitation   Patient Left in bed;with call bell/phone within reach;with bed alarm set;with nursing/sitter in room   Nurse Communication Mobility status        Time: 0962-8366 OT Time Calculation (min): 30 min  Charges: OT General Charges $OT Visit: 1 Visit OT Treatments $Self Care/Home Management : 8-22 mins $Therapeutic Activity: 8-22 mins  Lenor Derrick., COTA/L Acute Rehabilitation Services [phone]    Barron Schmid 12/25/2020, 10:30 AM

## 2020-12-25 NOTE — Progress Notes (Signed)
PROGRESS NOTE    [name]  TOI:[id number] DOB: 26-Dec-1965 DOA: 12/17/2020 PCP: [name]    Brief Narrative:  55 year old gentleman, initially with unknown identity brought to the ER after he complained of chest pain and became unresponsive with left-sided weakness and left gaze deviation. Was intubated in the ER. Apparently seen by bystanders when patient complained of chest pain and all of a sudden he became unresponsive. 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 1/17-1/20, admitted to ICU, extubated to 4 L oxygen on 1/20. 1/21, transferred to [facility] service, progressive bed. Patient has different MRN in our hospital system, he has frequent hospitalization and was recently admitted and discharged a week ago with diabetic toe ulcer. Review his previous records indicate that he is homeless, he is noncompliant, uses cocaine. He has a history of COPD, bipolar 1 disorder, known chronic heart failure with recent ejection fraction 40%, ischemic heart disease and previously declined PCI.  Significant diagnostics: 1/17 CT head: Left cerebellar hypodensity concerning for acute/subacute infarct. No intracranial hemorrhage. Small right parietal scalp hematoma 1/17 CTA head and neck: Non opacification of the left vertebral artery with retrograde filling of the distal left V4 segment. Bilateral carotid bifurcation atheromatous disease with 50% right and 30% left carotid bulb narrowing. Mild left P2 segment narrowing. 2D echo 1/18>>>EF <20%, hypokinesis, mod LVH, grade 2 diastolic dysfunction,  EEG 1/19>> This studyshowed evidence of epileptogenicity arising fromright>leftfrontal region. There is also moderatediffuse encephalopathy, nonspecific etiology butcould be secondary tosedation.No seizures were seen throughout the  recording.  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  Liver function tests normalized. A1c 6.4 EEG with diffuse encephalopathy Patient was treated with heparin.  Now on aspirin and Plavix for 3 weeks then aspirin alone. 1/25, patient complained of persistent right-sided weakness that he claims worsening, will recheck CT head today.  Acute on chronic systolic and diastolic congestive heart failure: Recent echocardiogram with ejection fraction 40% New echocardiogram ejection fraction less than 20%. On aspirin, Lipitor, Entresto, aldactone, coreg and Imdur and oral lasix.  Kidney functions are stable. Patient is noncompliant and will be difficult to treat.  Questionable seizure activity: No more seizure activities.  Neurology recommended no AEDs at this time. He has impulsiveness and some tremors, unlikely seizure.  Acute hypoxemic respiratory failure: Secondary to stroke.  Currently on room air.  Acute metabolic toxic encephalopathy: Multifactorial.  Stroke, drug use, acute respiratory failure and sedation.  Stabilizing. Patient has baseline behavior problems and intermittent agitations, aggressiveness towards the staff.  He endorsed some psychotic symptoms.  Seen by psychiatry and cleared for any lethality. Will need placement to skilled nursing facility.  Acute liver failure: Improved.  Probably due to ischemic liver.  Cocaine use: Counseled to quit.  Bipolar disorder/homelessness: Poor compliance. Homelessness.  Consulted psychiatry.  Denies any active suicidal or homicidal ideation.  No hallucinations or  delusions.  Continue mobilizing.  Referred to SNF. Patient is throwing away his telemetry and taking off his IV lines, discontinue telemetry.  Transfer to MedSurg bed. Transfer to skilled  nursing facility if available.  Bilateral leg ulcers: Probably ischemic leg ulcers.  Local wound care.  With acute a stroke, behavioral problems, not a surgical candidate at this time.   DVT prophylaxis: Aspirin and Plavix.   Code Status: Full code Family Communication: None.  No family at bedside.  When asked about it, he says not to mess up with them. Number for his good friend is nonexistent. Disposition Plan: Status is: Inpatient  Remains inpatient appropriate because:Inpatient level of care appropriate due to severity of illness   Dispo: The patient is from: Home              Anticipated d/c is to: SNF.              Anticipated d/c date is:  When bed available.    Patient currently is medically stable to transfer to skilled level of care when available.    Consultants:   PCCM  Cardiology  Palliative  Psychiatry  Procedures:   None  Antimicrobials:   None   Subjective: Patient seen and examined.  Impulsive but can be reoriented. Refuses care by the nursing staff and gets angry at times.  Objective: Vitals:   12/24/20 0817 12/24/20 1054 12/24/20 1951 12/25/20 0800  BP: 100/64 123/77 124/84 126/90  Pulse:  90 83 84  Resp:   15 16  Temp: 98.1 F (36.7 C)  98.2 F (36.8 C) 98.5 F (36.9 C)  TempSrc: Oral  Oral Oral  SpO2:   99% 99%  Weight:      Height:        Intake/Output Summary (Last 24 hours) at 12/25/2020 1104 Last data filed at 12/24/2020 1315 Gross per 24 hour  Intake 720 ml  Output 550 ml  Net 170 ml   Filed Weights   12/21/20 0425 12/22/20 0308 12/23/20 0500  Weight: 94.5 kg 89.2 kg 85.1 kg    Examination:  General exam: Appears calm but chronically sick looking, anxious, disheveled. Respiratory system: Clear to auscultation. Respiratory effort  normal.  Cardiovascular system: S1 & S2 heard, regular.  No murmur. Gastrointestinal system: Abdomen is nondistended, soft and nontender. No organomegaly or masses felt. Normal bowel sounds heard. Central nervous system: Alert and oriented x3. Cranial nerves with no obvious deficits. Right upper extremity lower extremity 3-4/5.  Rest of the extremities with normal power. Psychiatry: Judgement and insight appear compromised.  He is anxious.  He has flat affect. Denies any delusions or hallucinations. Discoloration of both legs. Patient has both great toe ulceration at the bottom.  Dry dressing.   Data Reviewed: I have personally reviewed following labs and imaging studies  CBC: Recent Labs  Lab 12/19/20 0533 12/20/20 0527 12/21/20 0100 12/22/20 0108  WBC 7.8 7.0 8.9 6.9  HGB 11.6* 11.1* 12.5* 12.7*  HCT 36.5* 32.8* 36.9* 37.8*  MCV 100.8* 98.8 98.1 96.2  PLT 272 261 291 289   Basic Metabolic Panel: Recent Labs  Lab 12/18/20 2238 12/19/20 0533 12/19/20 1418 12/20/20 0827 12/23/20 0109 12/24/20 0433  NA  --  137  --  139 136 137  K  --  4.3  --  4.0 3.6 4.0  CL  --  108  --  110 100 102  CO2  --  20*  --  21* 26 25  GLUCOSE  --  107*  --  148* 103* 99  BUN  --  16  --  16 9 9   CREATININE  --  1.16  --  1.02 0.94  0.99  CALCIUM  --  7.4*  --  7.9* 8.6* 8.8*  MG 1.8 1.8 1.9  --  1.9 1.8  PHOS 3.2 2.9 3.4  --  4.3  --    GFR: Estimated Creatinine Clearance: 94.4 mL/min (by C-G formula based on SCr of 0.99 mg/dL). Liver Function Tests: Recent Labs  Lab 12/21/20 0829  AST 55*  ALT 147*  ALKPHOS 91  BILITOT 0.7  PROT 6.3*  ALBUMIN 2.3*   No results for input(s): LIPASE, AMYLASE in the last 168 hours. No results for input(s): AMMONIA in the last 168 hours. Coagulation Profile: No results for input(s): INR, PROTIME in the last 168 hours. Cardiac Enzymes: No results for input(s): CKTOTAL, CKMB, CKMBINDEX, TROPONINI in the last 168 hours. BNP (last 3 results) No  results for input(s): PROBNP in the last 8760 hours. HbA1C: No results for input(s): HGBA1C in the last 72 hours. CBG: Recent Labs  Lab 12/23/20 1558 12/23/20 2004 12/24/20 0025 12/24/20 0820 12/24/20 1959  GLUCAP 103* 145* 139* 183* 117*   Lipid Profile: No results for input(s): CHOL, HDL, LDLCALC, TRIG, CHOLHDL, LDLDIRECT in the last 72 hours. Thyroid Function Tests: No results for input(s): TSH, T4TOTAL, FREET4, T3FREE, THYROIDAB in the last 72 hours. Anemia Panel: No results for input(s): VITAMINB12, FOLATE, FERRITIN, TIBC, IRON, RETICCTPCT in the last 72 hours. Sepsis Labs: No results for input(s): PROCALCITON, LATICACIDVEN in the last 168 hours.  Recent Results (from the past 240 hour(s))  Resp Panel by RT-PCR (Flu A&B, Covid) Nasopharyngeal Swab     Status: None   Collection Time: 12/17/20  2:07 PM   Specimen: Nasopharyngeal Swab; Nasopharyngeal(NP) swabs in vial transport medium  Result Value Ref Range Status   SARS Coronavirus 2 by RT PCR NEGATIVE NEGATIVE Final    Comment: (NOTE) SARS-CoV-2 target nucleic acids are NOT DETECTED.  The SARS-CoV-2 RNA is generally detectable in upper respiratory specimens during the acute phase of infection. The lowest concentration of SARS-CoV-2 viral copies this assay can detect is 138 copies/mL. A negative result does not preclude SARS-Cov-2 infection and should not be used as the sole basis for treatment or other patient management decisions. A negative result may occur with  improper specimen collection/handling, submission of specimen other than nasopharyngeal swab, presence of viral mutation(s) within the areas targeted by this assay, and inadequate number of viral copies(<138 copies/mL). A negative result must be combined with clinical observations, patient history, and epidemiological information. The expected result is Negative.  Fact Sheet for Patients:  BloggerCourse.com  Fact Sheet for  Healthcare Providers:  SeriousBroker.it  This test is no t yet approved or cleared by the [place] FDA and  has been authorized for detection and/or diagnosis of SARS-CoV-2 by FDA under an Emergency Use Authorization (EUA). This EUA will remain  in effect (meaning this test can be used) for the duration of the COVID-19 declaration under Section 564(b)(1) of the Act, 21 U.S.C.section 360bbb-3(b)(1), unless the authorization is terminated  or revoked sooner.       Influenza A by PCR NEGATIVE NEGATIVE Final   Influenza B by PCR NEGATIVE NEGATIVE Final    Comment: (NOTE) The Xpert Xpress SARS-CoV-2/FLU/RSV plus assay is intended as an aid in the diagnosis of influenza from Nasopharyngeal swab specimens and should not be used as a sole basis for treatment. Nasal washings and aspirates are unacceptable for Xpert Xpress SARS-CoV-2/FLU/RSV testing.  Fact Sheet for Patients: BloggerCourse.com  Fact Sheet for Healthcare Providers: SeriousBroker.it  This test is not  yet approved or cleared by the [place] and has been authorized for detection and/or diagnosis of SARS-CoV-2 by FDA under an Emergency Use Authorization (EUA). This EUA will remain in effect (meaning this test can be used) for the duration of the COVID-19 declaration under Section 564(b)(1) of the Act, 21 U.S.C. section 360bbb-3(b)(1), unless the authorization is terminated or revoked.  Performed at [facility], [address], [place], [name] [address]   MRSA PCR Screening     Status: None   Collection Time: 12/17/20  6:02 PM   Specimen: Nasal Mucosa; Nasopharyngeal  Result Value Ref Range Status   MRSA by PCR NEGATIVE NEGATIVE Final    Comment:        The GeneXpert MRSA Assay (FDA approved for NASAL specimens only), is one component of a comprehensive MRSA colonization surveillance program. It is not intended to  diagnose MRSA infection nor to guide or monitor treatment for MRSA infections. Performed at [facility], [address] [place], [name] [address]          Radiology Studies: No results found.      Scheduled Meds: . (feeding supplement) PROSource Plus  30 mL Oral BID BM  . aspirin  81 mg Oral Daily  . atorvastatin  40 mg Oral Daily  . carvedilol  6.25 mg Oral BID WC  . Chlorhexidine Gluconate Cloth  6 each Topical Daily  . clopidogrel  75 mg Oral Daily  . divalproex  500 mg Oral Daily  . feeding supplement  237 mL Oral BID BM  . furosemide  40 mg Oral Daily  . hydrALAZINE  25 mg Per Tube TID  . isosorbide mononitrate  30 mg Oral Daily  . magnesium oxide  400 mg Oral BID  . multivitamin with minerals  1 tablet Oral Daily  . polyethylene glycol  17 g Per Tube Daily  . sacubitril-valsartan  1 tablet Oral BID  . silver sulfADIAZINE   Topical BID  . spironolactone  12.5 mg Oral Daily   Continuous Infusions:    LOS: 8 days    Time spent: 30 minutes     Dorcas Carrow, MD [facility] Pager [phone]

## 2020-12-25 NOTE — Progress Notes (Signed)
Patient has been transferred to 3W. Upon transfer to unit, pt still is refusing medications, telemetry, and vital signs at this time. Patient only wants food and states "I want to be left alone." Floor mats are down and bed alarm is set. Charge nurse is made aware. Attending MD, Jerral Ralph has also been made aware.

## 2020-12-26 ENCOUNTER — Inpatient Hospital Stay (HOSPITAL_COMMUNITY): Payer: Medicaid Other

## 2020-12-26 DIAGNOSIS — I63441 Cerebral infarction due to embolism of right cerebellar artery: Secondary | ICD-10-CM

## 2020-12-26 DIAGNOSIS — J9601 Acute respiratory failure with hypoxia: Secondary | ICD-10-CM | POA: Diagnosis not present

## 2020-12-26 DIAGNOSIS — I5043 Acute on chronic combined systolic (congestive) and diastolic (congestive) heart failure: Secondary | ICD-10-CM | POA: Diagnosis not present

## 2020-12-26 DIAGNOSIS — I651 Occlusion and stenosis of basilar artery: Secondary | ICD-10-CM | POA: Diagnosis not present

## 2020-12-26 DIAGNOSIS — I6300 Cerebral infarction due to thrombosis of unspecified precerebral artery: Secondary | ICD-10-CM | POA: Diagnosis not present

## 2020-12-26 DIAGNOSIS — R569 Unspecified convulsions: Secondary | ICD-10-CM

## 2020-12-26 DIAGNOSIS — R4182 Altered mental status, unspecified: Secondary | ICD-10-CM | POA: Diagnosis not present

## 2020-12-26 LAB — COMPREHENSIVE METABOLIC PANEL
ALT: 46 U/L — ABNORMAL HIGH (ref 0–44)
AST: 25 U/L (ref 15–41)
Albumin: 2.6 g/dL — ABNORMAL LOW (ref 3.5–5.0)
Alkaline Phosphatase: 75 U/L (ref 38–126)
Anion gap: 11 (ref 5–15)
BUN: 19 mg/dL (ref 6–20)
CO2: 25 mmol/L (ref 22–32)
Calcium: 8.7 mg/dL — ABNORMAL LOW (ref 8.9–10.3)
Chloride: 98 mmol/L (ref 98–111)
Creatinine, Ser: 1.01 mg/dL (ref 0.61–1.24)
GFR, Estimated: >60 mL/min (ref >60)
Glucose, Bld: 161 mg/dL — ABNORMAL HIGH (ref 70–99)
Potassium: 4 mmol/L (ref 3.5–5.1)
Sodium: 134 mmol/L — ABNORMAL LOW (ref 135–145)
Total Bilirubin: 0.7 mg/dL (ref 0.3–1.2)
Total Protein: 7.1 g/dL (ref 6.5–8.1)

## 2020-12-26 LAB — CBC WITH DIFFERENTIAL/PLATELET
Abs Immature Granulocytes: 0.01 10*3/uL (ref 0.00–0.07)
Basophils Absolute: 0 10*3/uL (ref 0.0–0.1)
Basophils Relative: 1 %
Eosinophils Absolute: 0.1 10*3/uL (ref 0.0–0.5)
Eosinophils Relative: 3 %
HCT: 43.6 % (ref 39.0–52.0)
Hemoglobin: 14.1 g/dL (ref 13.0–17.0)
Immature Granulocytes: 0 %
Lymphocytes Relative: 33 %
Lymphs Abs: 1.8 10*3/uL (ref 0.7–4.0)
MCH: 30.9 pg (ref 26.0–34.0)
MCHC: 32.3 g/dL (ref 30.0–36.0)
MCV: 95.4 fL (ref 80.0–100.0)
Monocytes Absolute: 0.9 10*3/uL (ref 0.1–1.0)
Monocytes Relative: 18 %
Neutro Abs: 2.4 10*3/uL (ref 1.7–7.7)
Neutrophils Relative %: 45 %
Platelets: 374 10*3/uL (ref 150–400)
RBC: 4.57 MIL/uL (ref 4.22–5.81)
RDW: 14.6 % (ref 11.5–15.5)
WBC: 5.3 10*3/uL (ref 4.0–10.5)
nRBC: 0 % (ref 0.0–0.2)

## 2020-12-26 LAB — PHOSPHORUS: Phosphorus: 4.3 mg/dL (ref 2.5–4.6)

## 2020-12-26 LAB — GLUCOSE, CAPILLARY
Glucose-Capillary: 159 mg/dL — ABNORMAL HIGH (ref 70–99)
Glucose-Capillary: 103 mg/dL — ABNORMAL HIGH (ref 70–99)
Glucose-Capillary: 148 mg/dL — ABNORMAL HIGH (ref 70–99)
Glucose-Capillary: 143 mg/dL — ABNORMAL HIGH (ref 70–99)

## 2020-12-26 LAB — MAGNESIUM: Magnesium: 2 mg/dL (ref 1.7–2.4)

## 2020-12-26 IMAGING — MR MR HEAD W/O CM
4 of 9 series · 23 of 48 positions shown · non-contrast
Comparison: MRI of the brain performed on [DATE].

CLINICAL DATA: Neuro deficit, acute, stroke suspected.

EXAM:
MRI HEAD WITHOUT CONTRAST
TECHNIQUE: Multiplanar, multiecho pulse sequences of the brain and surrounding
structures were obtained without intravenous contrast.

[Series 2: DWI · axial · 3.0mm · 0.94mm/px · z∈[-72,+72]mm · 8 of 100 slices shown (1 of 2)]
[im 1/100]
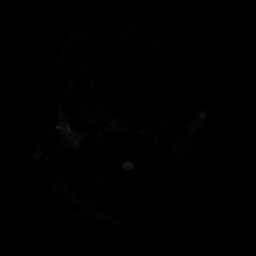
[im 16/100]
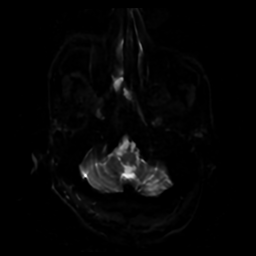
[im 31/100]
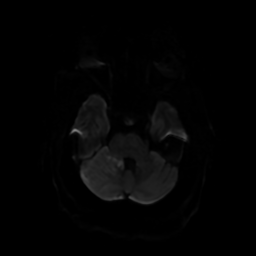
[im 46/100]
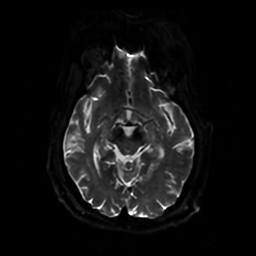
[im 54/100]
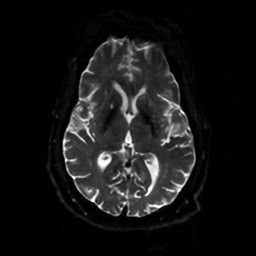
[im 69/100]
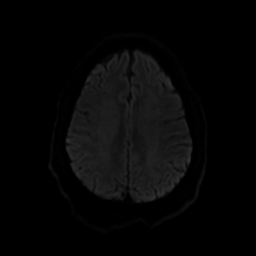
[im 84/100]
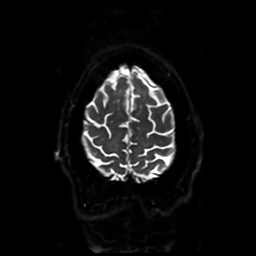
[im 100/100]
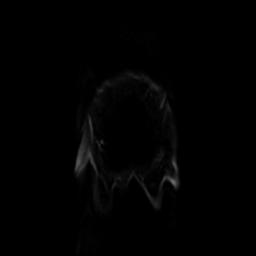

[Series 3: DWI · coronal · 4.0mm · 0.94mm/px · 9 of 72 slices shown (2 of 2)]
[im 1/72]
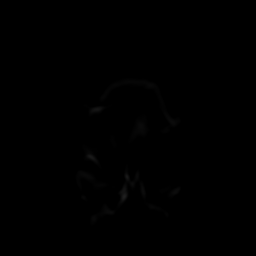
[im 9/72]
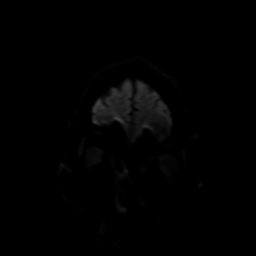
[im 18/72]
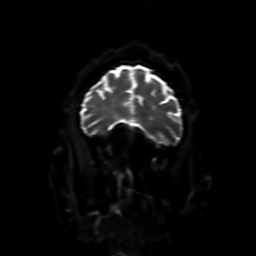
[im 27/72]
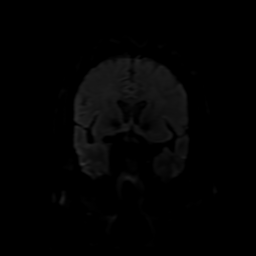
[im 36/72]
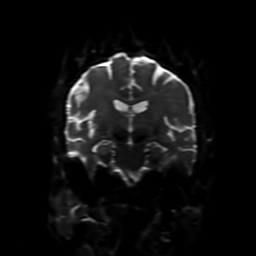
[im 45/72]
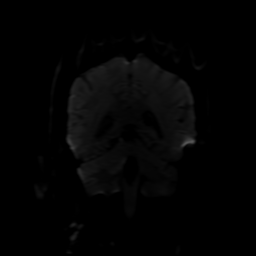
[im 54/72]
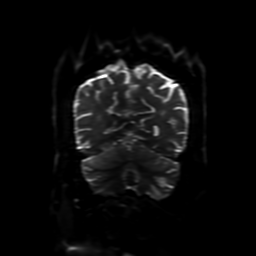
[im 63/72]
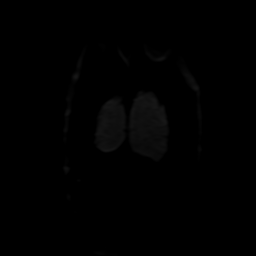
[im 72/72]
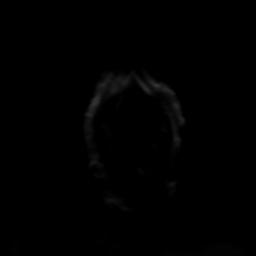

[Series 4: FLAIR · sagittal · 5.0mm · 0.23mm/px · 3 of 23 slices shown (1 of 2)]
[im 1/23]
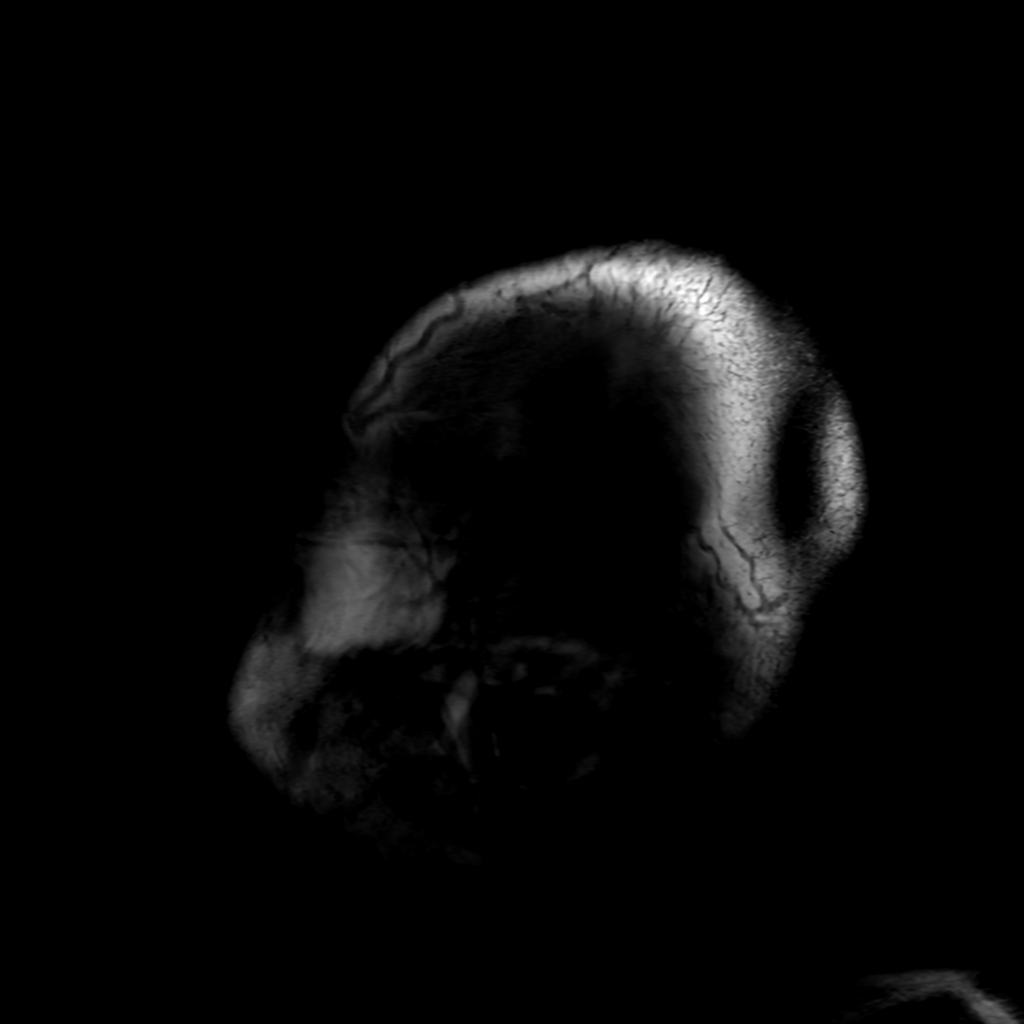
[im 12/23]
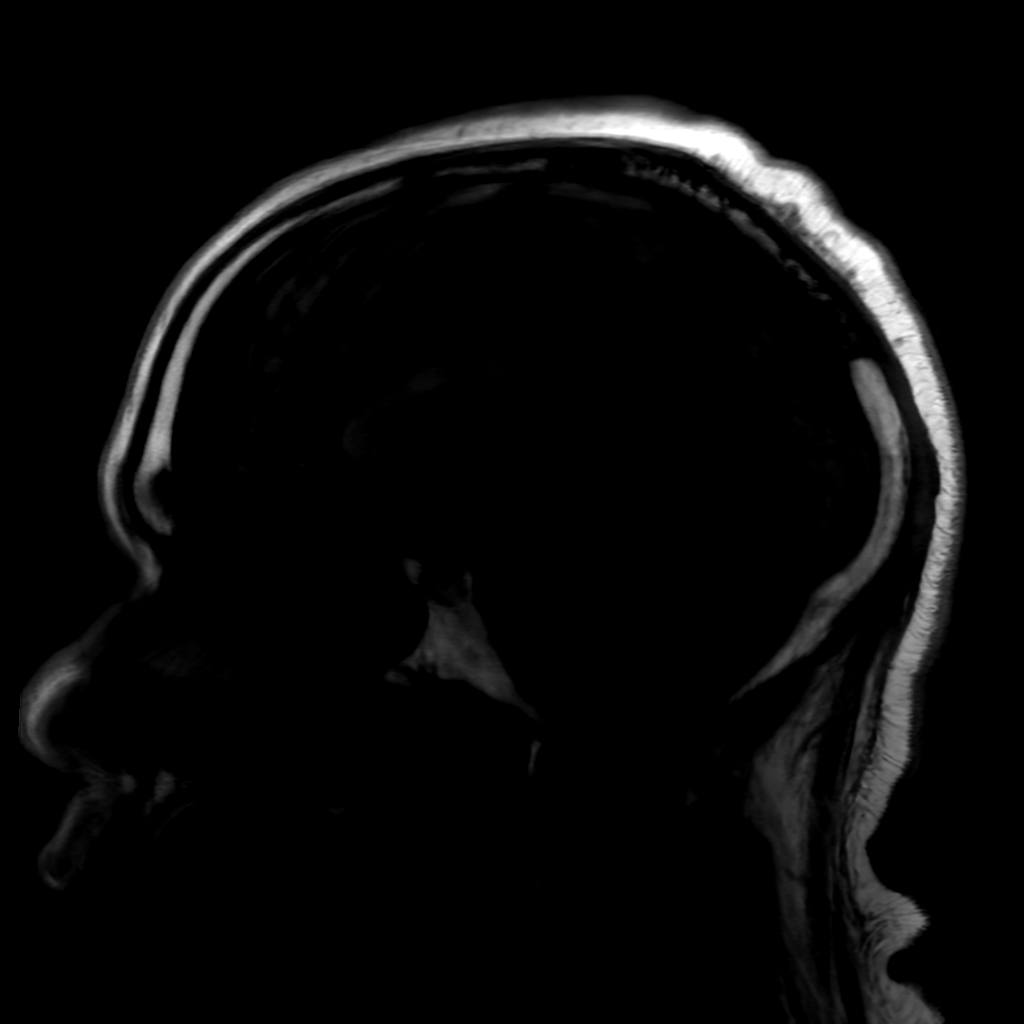
[im 23/23]
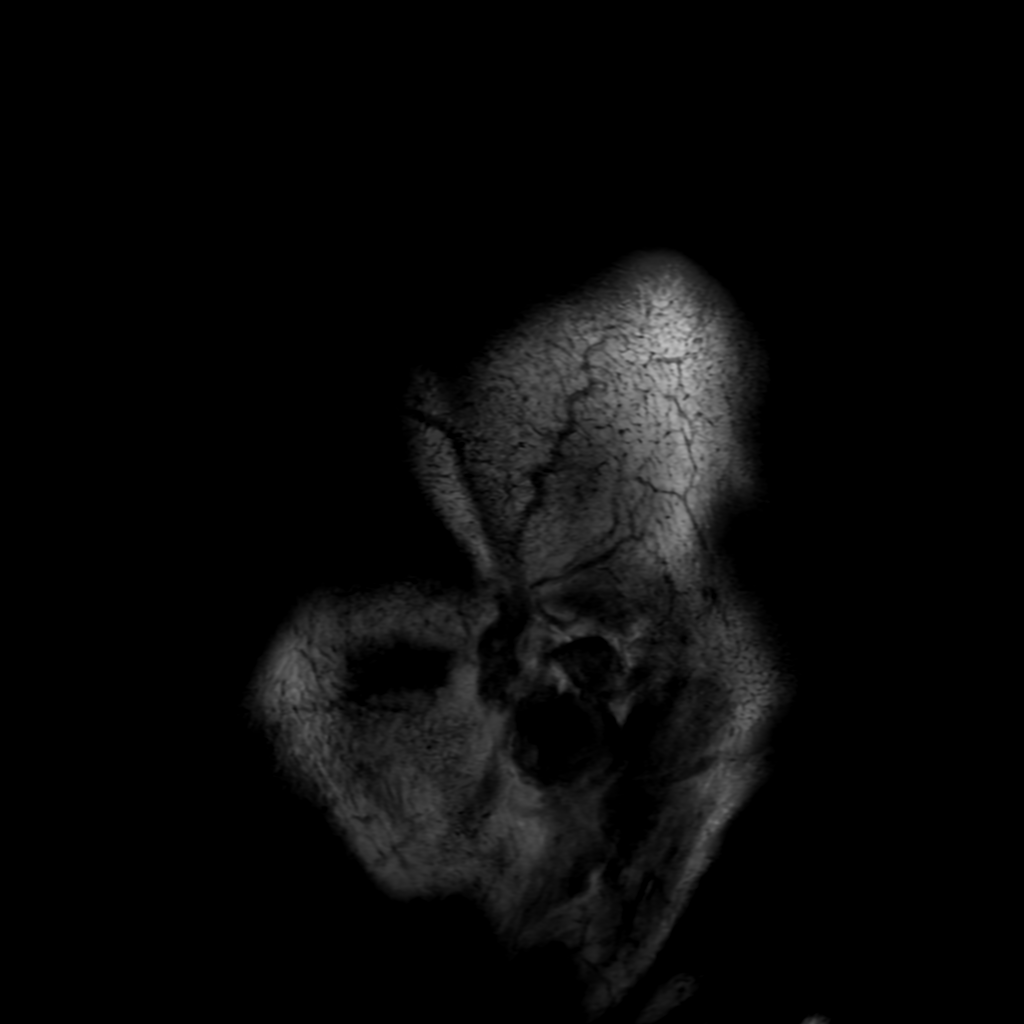

[Series 6: FLAIR · axial · 5.0mm · 0.47mm/px · z∈[-68,+73]mm · 3 of 25 slices shown (2 of 2)]
[im 1/25]
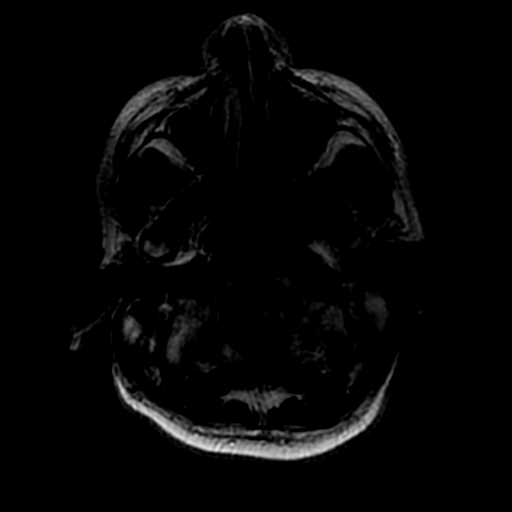
[im 13/25]
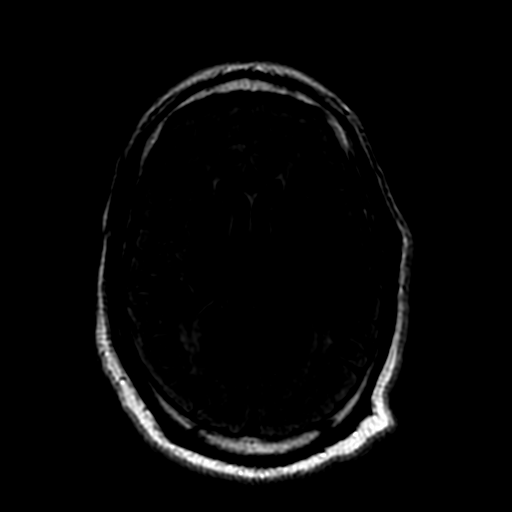
[im 25/25]
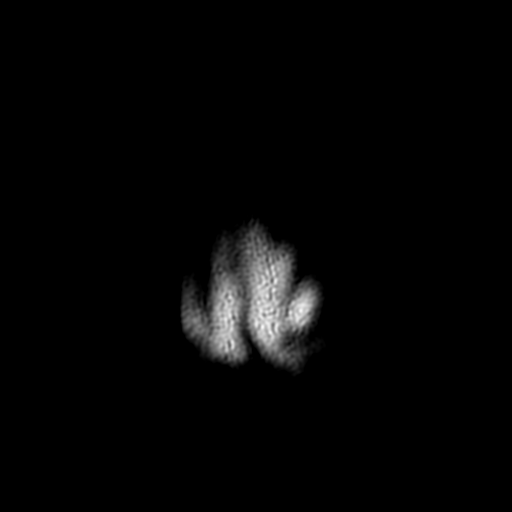

[23 of 48 positions shown; findings below may reference images not displayed]

FINDINGS: The study is degraded by motion.

Brain: Area of restricted diffusion involving the right superior
cerebellar peduncle and right middle cerebellar peduncle, consistent
with a right SCA territory infarct. Edema in the right cerebellar
hemisphere is also noted from subacute infarct seen on prior MRI.

Scattered and confluent foci of T2 hyperintensity are seen within
the white matter of the cerebral hemispheres, nonspecific. No
hemorrhage, hydrocephalus, mass or midline shift.

Vascular: Abnormal flow void in the V4 segment of the left vertebral
artery may represent slow flow versus occlusion.

Skull and upper cervical spine: Normal marrow signal.

Sinuses/Orbits: Negative.
IMPRESSION: 1. Acute infarct involving the right superior cerebellar peduncle
and right middle cerebellar peduncle.
2. Edema in the right cerebellar hemisphere from subacute infarct
seen on prior MRI.
3. Mild to moderate chronic white matter disease.

## 2020-12-26 IMAGING — CT CT HEAD CODE STROKE
3 series · 14 of 47 positions shown, 16 images · non-contrast
Comparison: Head CT [DATE] and MRI [DATE]

CLINICAL DATA: Code stroke.  New onset confusion.

EXAM:
CT HEAD WITHOUT CONTRAST
TECHNIQUE: Contiguous axial images were obtained from the base of the skull
through the vertex without intravenous contrast.

[Series 2: head 5.0 st · axial · 0.47mm/px · z∈[+1416,+1566]mm · 8 of 36 slices shown, 10 images]
[im 3/36  brain]
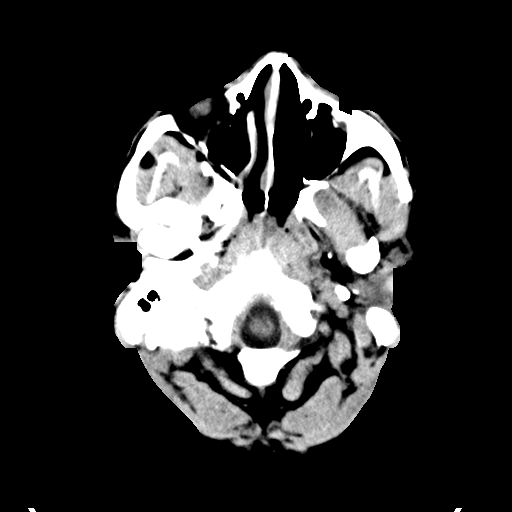
[im 3/36  bone]
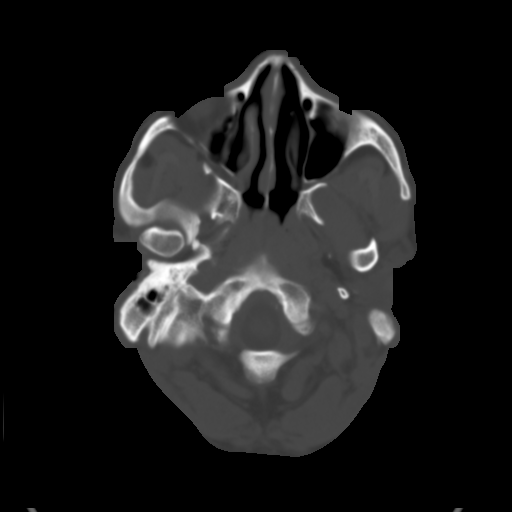
[im 8/36  brain]
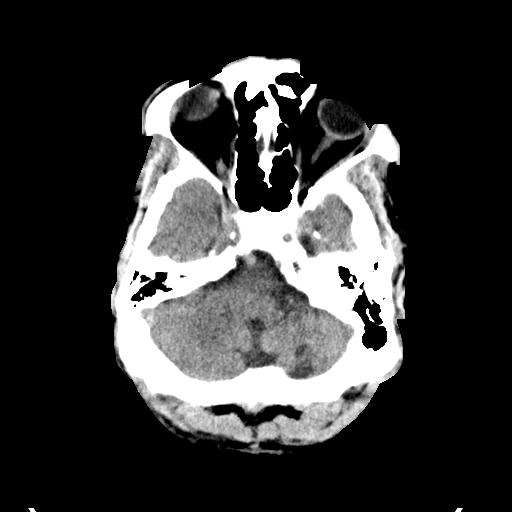
[im 11/36  brain]
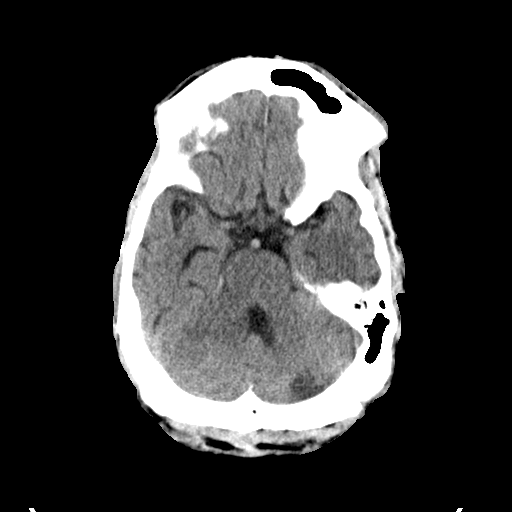
[im 16/36  brain]
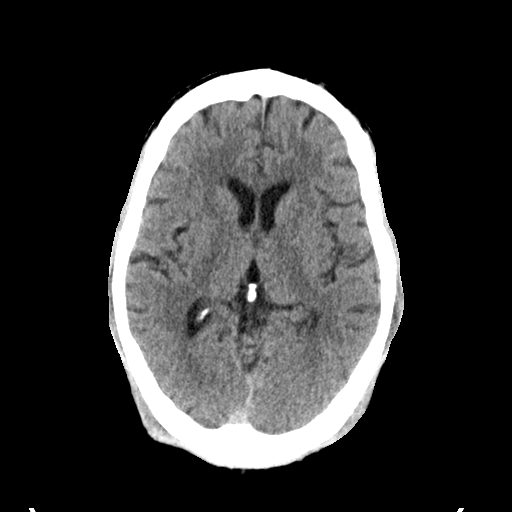
[im 20/36  brain]
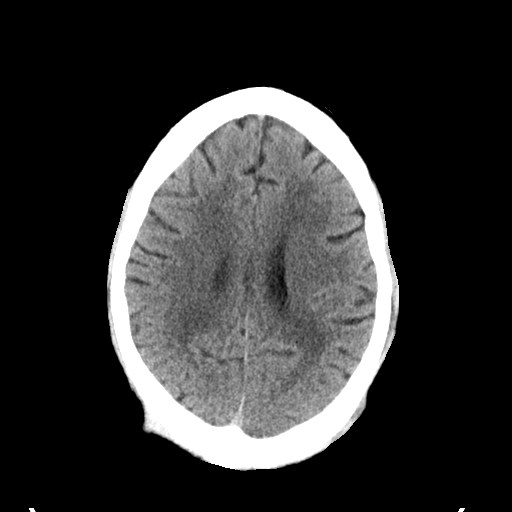
[im 20/36  bone]
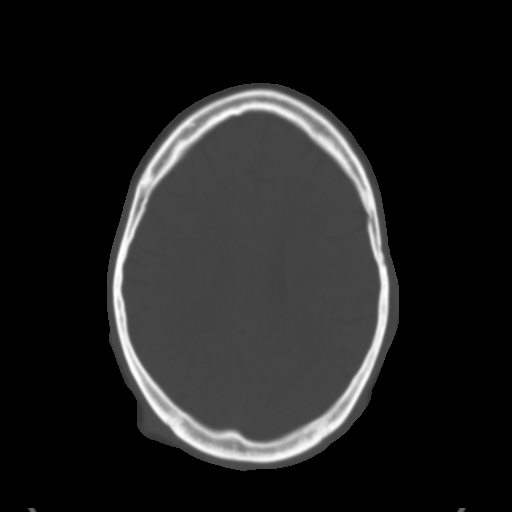
[im 25/36  brain]
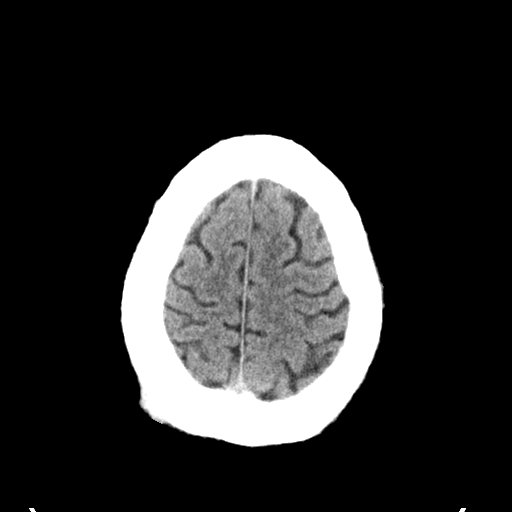
[im 28/36  brain]
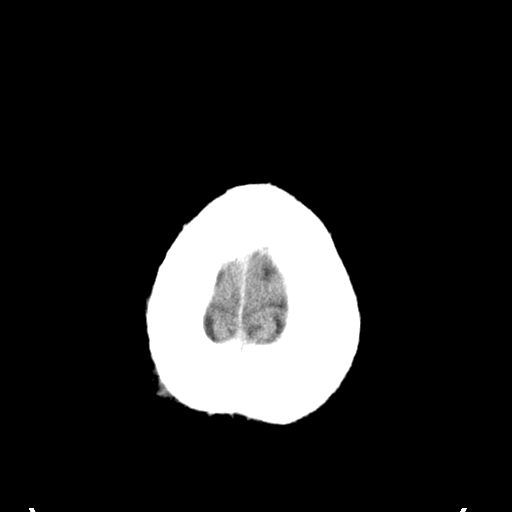
[im 33/36  brain]
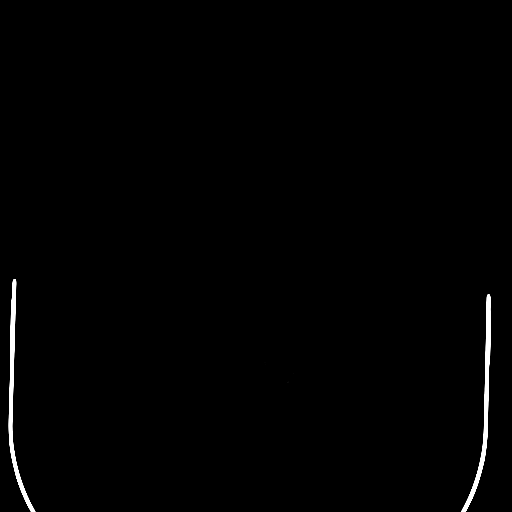

[Series 5: head 3.0 cor st · coronal · 0.33mm/px · 3 of 66 slices shown]
[im 22/66  brain]
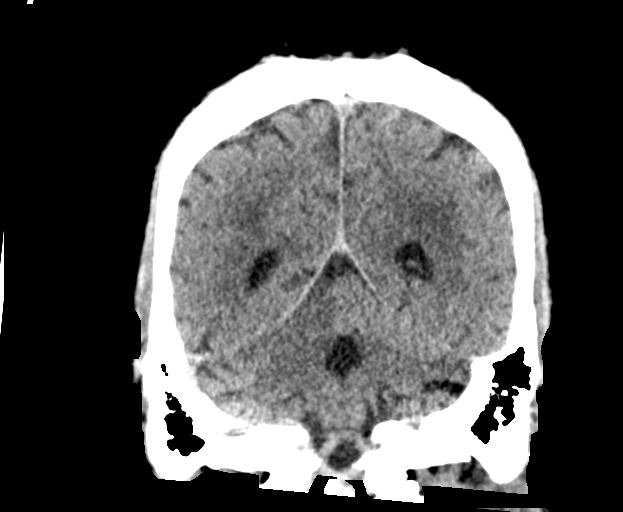
[im 29/66  brain]
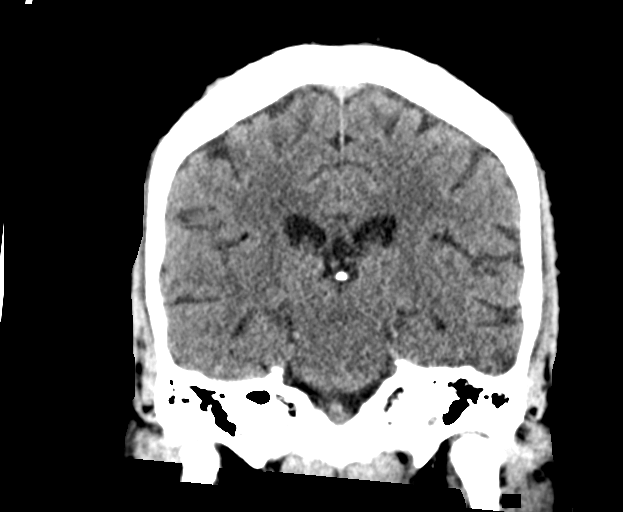
[im 37/66  brain]
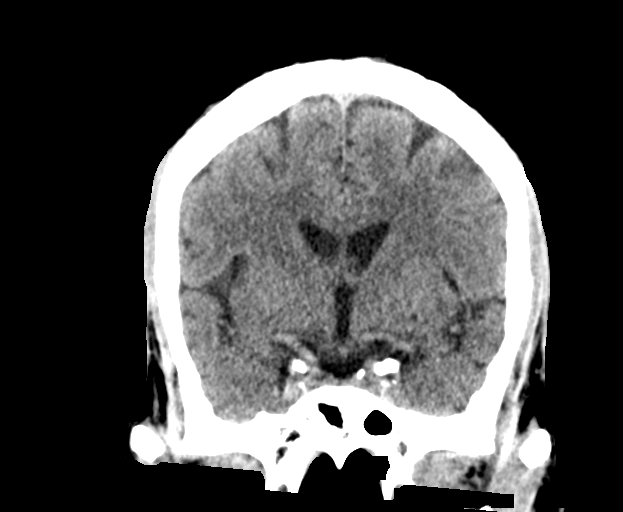

[Series 6: head 3.0 sag st · sagittal · 0.33mm/px · 3 of 52 slices shown]
[im 18/52  brain]
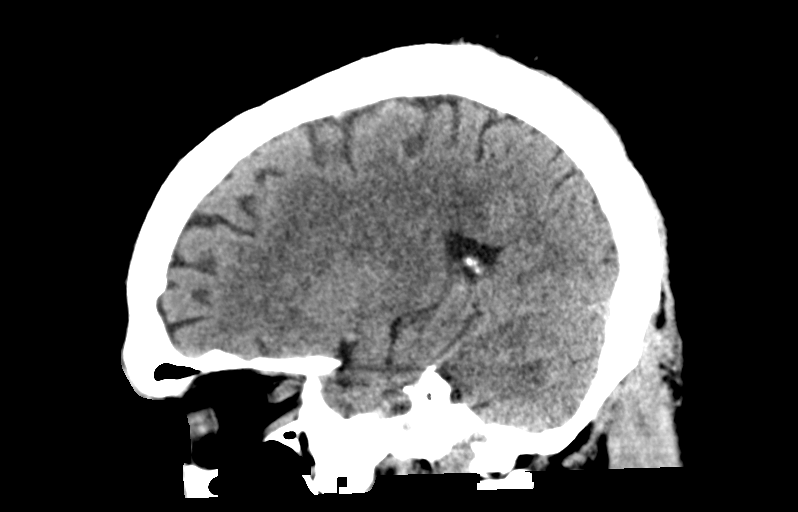
[im 26/52  brain]
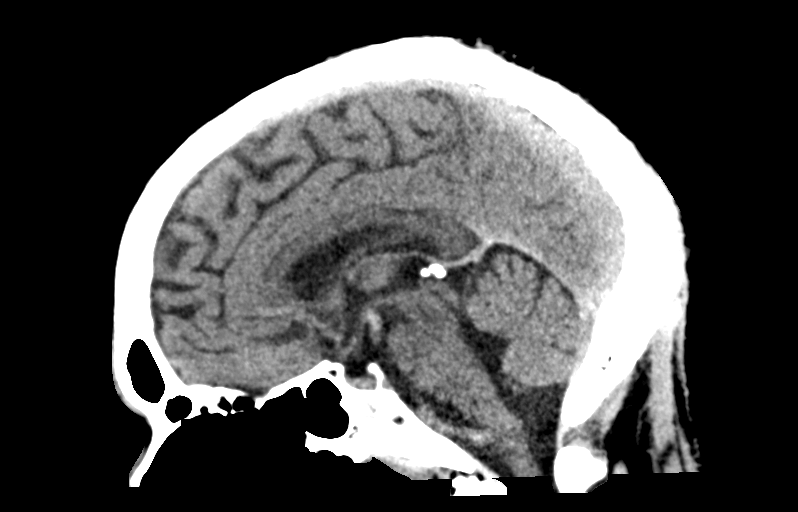
[im 35/52  brain]
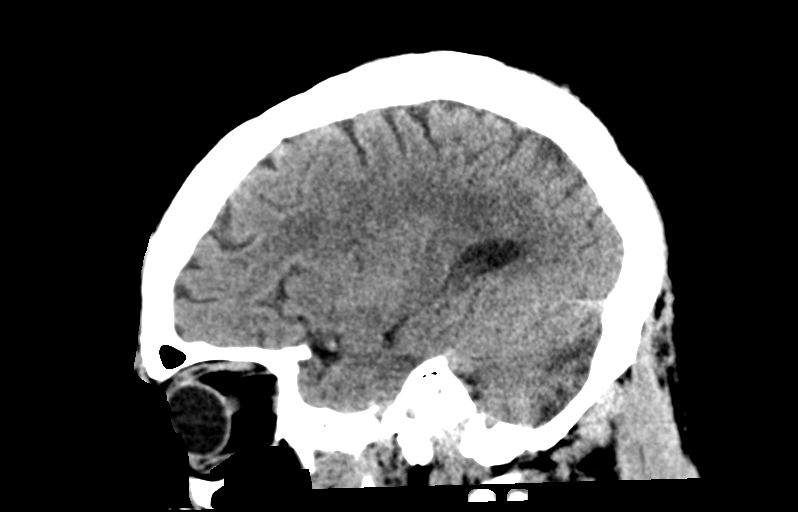

[14 of 47 positions shown; findings below may reference images not displayed]

FINDINGS: Brain: A subacute infarct in the superior right cerebellum is
unchanged, and a chronic infarct is again noted inferiorly in the
left cerebellum. No new infarct, intracranial hemorrhage, mass,
midline shift, or extra-axial fluid collection is identified.
Hypodensities in the cerebral white matter bilaterally are unchanged
and nonspecific but compatible with mild-to-moderate chronic small
vessel ischemic disease. The ventricles and sulci are within normal
limits for age.

Vascular: Calcified atherosclerosis at the skull base. No suspicious
vessel hyperdensity.

Skull: No fracture or suspicious osseous lesion.

Sinuses/Orbits: Visualized paranasal sinuses and mastoid air cells
are clear. Remote medial left orbital fracture.

Other: None.

ASPECTS ([place] Stroke Program Early CT Score)

Not scored with this history.
IMPRESSION: 1. No evidence of acute intracranial abnormality.
2. Subacute right and chronic left cerebellar infarcts.
3. Mild-to-moderate chronic small vessel ischemic disease.

These results were communicated to Dr. MAURY at [DATE] on [DATE] by
text page via the [employer] messaging system.

## 2020-12-26 MED ORDER — LEVETIRACETAM 500 MG PO TABS
500.0000 mg | ORAL_TABLET | Freq: Two times a day (BID) | ORAL | Status: DC
  Administered 2020-12-27 – 2021-01-24 (×42): 500 mg via ORAL
  Filled 2020-12-26 (×54): qty 1

## 2020-12-26 MED ORDER — LORAZEPAM 2 MG/ML IJ SOLN
INTRAMUSCULAR | Status: AC
  Administered 2020-12-26: 2 mg
  Filled 2020-12-26: qty 2

## 2020-12-26 MED ORDER — TRAMADOL HCL 50 MG PO TABS
50.0000 mg | ORAL_TABLET | Freq: Four times a day (QID) | ORAL | Status: DC | PRN
  Administered 2020-12-26 – 2020-12-28 (×6): 50 mg via ORAL
  Filled 2020-12-26 (×9): qty 1

## 2020-12-26 NOTE — Progress Notes (Signed)
Patient now on telemetry/continous heart monitoring. Pt is now complaining of generalized pain, will not specify pain site. Discussed giving tylenol, pt refused and explained that he has an allergy to ibuprofen, as well as tylenol despite taking tylenol on 12/23/2020 with no reaction.   MD has been notified.

## 2020-12-26 NOTE — Progress Notes (Signed)
Physical Therapy Treatment Patient Details Name: [name] MRN: [id number] DOB: Jul 30, 1966 Today's Date: 12/26/2020    History of Present Illness Homeless 55 yo male found down with c/o CP by bystanders. 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 Extubated 1/20.    PT Comments    Pt agitated and resistive at times but communicating upon arrival and following cues to perform sit to stands and weight shifting in stedy with +2 assist. Towards end of session, pt sponatenously became wide-eyed and impulsive moving all around but not following directions or verbalizing despite a moment ago talking to PT. RN arrived ~30-60 seconds after start of episode and called Code Stroke. Pt wavered between bouts of lethargy with eyes closed and not moving supine in bed to impulsive and wide-eyed trying to sit up and get out of bed. Will continue to follow acutely. Current recommendations remain appropriate.  Follow Up Recommendations  SNF;Supervision/Assistance - 24 hour     Equipment Recommendations  Other (comment) (TBA)    Recommendations for Other Services       Precautions / Restrictions Precautions Precautions: Fall Precaution Comments: has been agressive at times, can quickly become agitated Restrictions Weight Bearing Restrictions: No    Mobility  Bed Mobility Overal bed mobility: Needs Assistance Bed Mobility: Supine to Sit;Sit to Supine     Supine to sit: HOB elevated;Mod assist Sit to supine: Max assist;+2 for physical assistance;+2 for safety/equipment   General bed mobility comments: Pt agitated and impulsively coming to sit L EOB with extra time and modA. When pt's cognitive status changed later in session he required maxAx2 to return to supine.  Transfers Overall transfer level: Needs  assistance   Transfers: Sit to/from Stand Sit to Stand: +2 physical assistance;Max assist;Mod assist;+2 safety/equipment         General transfer comment: Sit to stand 1x from EOB to stedy, requiring cues for proper safe set-up. Mod-maxAx1 with min guard-minAx1 to come to stand from EOB 1x and from stedy 3 subsequent reps. But subsequent final 2 reps pt impulsively came to stand with maxAx1 and then needed maxAx2 to come to stand as pt not following cues to return to bed.  Ambulation/Gait             General Gait Details: Pre-gait training, shifting weight laterally between legs with intermittent 1 foot lift. ModAx2 for balance.   Stairs             Wheelchair Mobility    Modified Rankin (Stroke Patients Only) Modified Rankin (Stroke Patients Only) Pre-Morbid Rankin Score: No symptoms Modified Rankin: Severe disability     Balance Overall balance assessment: Needs assistance Sitting-balance support: Feet supported;No upper extremity supported Sitting balance-Leahy Scale: Fair Sitting balance - Comments: pt able to sit EOB with no UE support with minguard   Standing balance support: Bilateral upper extremity supported Standing balance-Leahy Scale: Poor Standing balance comment: reliant on BUE support                            Cognition Arousal/Alertness: Awake/alert Behavior During Therapy: Impulsive;Restless;Agitated Overall Cognitive Status: Impaired/Different from baseline Area of Impairment: Attention;Following commands;Safety/judgement;Awareness;Problem solving;Memory                   Current Attention Level: Selective Memory: Decreased short-term memory  Following Commands: Follows one step commands with increased time;Follows one step commands inconsistently Safety/Judgement: Decreased awareness of safety;Decreased awareness of deficits Awareness: Intellectual Problem Solving: Slow processing;Decreased initiation;Requires verbal  cues;Difficulty sequencing;Requires tactile cues General Comments: Pt demonstrates ataxic movements, requiring repeated cues to keep hands in contact with stedy bars at all times. Pt with poor awareness of his deficits. He was agitated and resistant often during session, but then as beginning to prepare to return pt to bed his eyes became wide and he impulsively came to stand and starting atxizally moving UEs in the air. Pt went from following majority of simple cues to following none, needing maxAx1-2 to calm and return to bed. RN arrived ~30-60 seconds into this episode and was made aware of the change, Code Stroke was called by RN. Pt only following 1-step commands intermittently and would quickly become lethargic with eyes closed to impulsive trying to get out of bed with eyes open again.      Exercises      General Comments General comments (skin integrity, edema, etc.): Towards end of session, pt sponatenously became wide-eyed and impulsive moving all around but not following directions or verbalizing despite a moment ago talking to PT. RN arrived ~30-60 seconds after start of episode and called Code Stroke. Pt wavered between bouts of lethargy with eyes closed and not moving supine in bed to impulsive and wide-eyed trying to sit up and get out of bed.      Pertinent Vitals/Pain Pain Assessment: 0-10 Pain Score: 8  Pain Location: Lower back Pain Descriptors / Indicators: Discomfort;Grimacing;Guarding;Aching Pain Intervention(s): Limited activity within patient's tolerance;Monitored during session;Repositioned (notified RN)    Home Living                      Prior Function            PT Goals (current goals can now be found in the care plan section) Acute Rehab PT Goals Patient Stated Goal: to get the session done PT Goal Formulation: With patient Time For Goal Achievement: 01/03/21 Potential to Achieve Goals: Good Progress towards PT goals: Not progressing toward goals -  comment (pt had change in mental and functional status during session, Code Stroke was called)    Frequency    Min 3X/week      PT Plan Current plan remains appropriate    Co-evaluation              AM-PAC PT "6 Clicks" Mobility   Outcome Measure  Help needed turning from your back to your side while in a flat bed without using bedrails?: A Lot Help needed moving from lying on your back to sitting on the side of a flat bed without using bedrails?: A Lot Help needed moving to and from a bed to a chair (including a wheelchair)?: A Lot Help needed standing up from a chair using your arms (e.g., wheelchair or bedside chair)?: A Lot Help needed to walk in hospital room?: A Lot Help needed climbing 3-5 steps with a railing? : A Lot 6 Click Score: 12    End of Session Equipment Utilized During Treatment: Gait belt Activity Tolerance: Treatment limited secondary to medical complications (Comment);Treatment limited secondary to agitation Patient left: in bed;with call bell/phone within reach;with nursing/sitter in room (code stroke called, team in room) Nurse Communication: Mobility status;Other (comment) (change in mental and functional status) PT Visit Diagnosis: Unsteadiness on feet (R26.81);Other symptoms and signs involving the nervous system (R29.898);Other abnormalities  of gait and mobility (R26.89);Ataxic gait (R26.0);Difficulty in walking, not elsewhere classified (R26.2)     Time: 1478-2956 PT Time Calculation (min) (ACUTE ONLY): 28 min  Charges:  $Therapeutic Activity: 23-37 mins                     Raymond Gurney, PT, DPT Acute Rehabilitation Services  Pager: [phone] Office: [phone]    [name] 12/26/2020, 5:43 PM

## 2020-12-26 NOTE — Progress Notes (Signed)
SLP Cancellation Note  Patient Details Name: Tristen Luce MRN: [id number] DOB: 1966/04/17   Cancelled treatment:       Reason Eval/Treat Not Completed: Medical issues which prohibited therapy. Pt with change in status per MD. Going off the floor for testing. Will f/u as able.  Zettie Cooley., SLP Student 12/26/2020, 1:49 PM

## 2020-12-26 NOTE — Plan of Care (Signed)
Went back to see pt again. He was lying in bed, mild restless, but more interactive, he stated that he still has lower back pain but his headache is much improved and currently not bothering him. I asked him what happened this after he was working with PT/OT at that time. Suprisingly, he told me that "I was going to suicide". I asked him why, he said to me "some people do that, I do that". I said "what makes you think about that?" He then replied to me "I do not want to talk about it".   I then did my neuro exam again, he seemed back to his previous baseline. He is orientated to place age and year. But not to month. Moderate dysarthria, mild perseveration but able to repeat and name and able to follow simple commands. No gaze palsy, blinking to visual threat bilaterally, facial symmetrical. RUE mild drift with significant ataxia, moving BLEs symmetrically. Sensation symmetrical.   I discussed with RN, charge RN, and Dr. Marland Mcalpine about pt possible "suicidal ideation". Bedside sitter and psychiatry consult may be considered. I still can not rule out the afternoon episode being seizure activities, will do EEG and will add keppra 500mg  bid.   , MD PhD Stroke Neurology 12/26/2020 5:35 PM

## 2020-12-26 NOTE — Progress Notes (Signed)
STROKE TEAM PROGRESS NOTE   INTERVAL HISTORY Called by RN for code stroke this afternoon. Apparently pt at his baseline with LKW 11:30am. At that time, pt awake alert orientated, but dysarthria, paucity of speech. But able to answer questions and able to tell RN what he wants to order for lunch. At baseline, he has right arm weakness with flailing around but able to hold it up. Around 1:30pm, he was working with PT/OT, on lying down pt seems at baseline orientated. However, on sitting up, pt had acute mental status change, eyes wide open, not responding to PT/OT, not pay attention to hands waving in front of his eyes. Not answer questions or following commands. Restless and agitated. On may arrival, pt was put down in bed, still agitated and moving around. When asked, he said he had bad HA. Able to speak words to communicate. Right arm weak with ataxia, but right leg seems strong. No obvious shaking or jerking. Received 2mg  of ativan. CT head no acute finding. MRI brain pending.  Vitals:   12/26/20 1119 12/26/20 1333 12/26/20 1457 12/26/20 1709  BP: (!) 129/96 115/84 99/75 114/65  Pulse: 79 84 85 82  Resp: 18  20 20   Temp: 98.2 F (36.8 C)  98 F (36.7 C) 97.9 F (36.6 C)  TempSrc: Oral  Axillary Oral  SpO2: 98% 100% 95% 100%  Weight:      Height:       CBC:  Recent Labs  Lab 12/22/20 0108 12/26/20 0848  WBC 6.9 5.3  NEUTROABS  --  2.4  HGB 12.7* 14.1  HCT 37.8* 43.6  MCV 96.2 95.4  PLT 289 374   Basic Metabolic Panel:  Recent Labs  Lab 12/23/20 0109 12/24/20 0433 12/26/20 0848  NA 136 137 134*  K 3.6 4.0 4.0  CL 100 102 98  CO2 26 25 25   GLUCOSE 103* 99 161*  BUN 9 9 19   CREATININE 0.94 0.99 1.01  CALCIUM 8.6* 8.8* 8.7*  MG 1.9 1.8 2.0  PHOS 4.3  --  4.3   Lipid Panel: LDL 72 HgbA1c: 6.4 Urine Drug Screen:  + cocaine 12/17/20  IMAGING past 24 hours  EEG  12/19/2020  EEG showed continuous generalized 6 to 7 Hz theta slowing.  Right >left frontal spikes were also  noted, maximalHyperventilation and photic stimulation were not performed.   CT Head wo contrast 12/19/2020  IMPRESSION: Acute infarct right superior cerebellum unchanged from MRI.  CT head Code stroke wo contrast 12/17/2020  IMPRESSION: 1. Left cerebellar hypodensity concerning for acute/subacute infarct. 2. No intracranial hemorrhage. 3. ASPECTS is 10 4. Small right parietal scalp hematoma.  CT Angio Head Neck w or wo contrast 12/17/2020  IMPRESSION: Non opacification of the left vertebral artery with retrograde filling of the distal left V4 segment.   Bilateral carotid bifurcation atheromatous disease with 50% right and 30% left carotid bulb narrowing.   Mild left P2 segment narrowing.   Sub optimal contrast bolus timing limits evaluation.  X-ray Abdomen 12/17/2020  IMPRESSION: Esophageal tube tip overlies the proximal stomach.   MRA Head 12/18/2020  IMPRESSION: MRI HEAD IMPRESSION:   1. 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. No associated mass effect. 2. Chronic left PICA distribution infarct. Abnormal flow void within the left V4 segment consistent with slow flow as seen on corresponding MRA. 3. Underlying mild-to-moderate chronic microvascular ischemic disease.   MRA HEAD IMPRESSION:   1. Acute occlusion of the right superior cerebellar artery. 2. Attenuated but  patent flow within the left V4 segment, suggesting a proximal stenosis. 3. Otherwise wide patency of the major intracranial arterial circulation. No other hemodynamically significant or correctable Stenosis  Echocardiogram 12/18/2020  IMPRESSIONS     1. Left ventricular ejection fraction, by estimation, is <20%. The left  ventricle has severely decreased function. The left ventricle demonstrates  global hypokinesis. The left ventricular internal cavity size was  moderately dilated. There is moderate  left ventricular hypertrophy. Left  ventricular diastolic parameters are  consistent with Grade II diastolic dysfunction (pseudonormalization).  Elevated left atrial pressure.   2. Right ventricular systolic function is moderately reduced. The right  ventricular size is mildly enlarged.   3. Left atrial size was moderately dilated.   4. Right atrial size was moderately dilated.   5. The mitral valve is normal in structure. Trivial mitral valve  regurgitation. No evidence of mitral stenosis.   6. The aortic valve is tricuspid. Aortic valve regurgitation is not  visualized. Mild aortic valve sclerosis is present, with no evidence of  aortic valve stenosis.   7. The inferior vena cava is normal in size with greater than 50%  respiratory variability, suggesting right atrial pressure of 3 mmHg.   MR BRAIN WO CONTRAST  Result Date: 12/26/2020 CLINICAL DATA:  Neuro deficit, acute, stroke suspected. EXAM: MRI HEAD WITHOUT CONTRAST TECHNIQUE: Multiplanar, multiecho pulse sequences of the brain and surrounding structures were obtained without intravenous contrast. COMPARISON:  MRI of the brain performed on December 17, 2020. FINDINGS: The study is degraded by motion. Brain: Area of restricted diffusion involving the right superior cerebellar peduncle and right middle cerebellar peduncle, consistent with a right SCA territory infarct. Edema in the right cerebellar hemisphere is also noted from subacute infarct seen on prior MRI. Scattered and confluent foci of T2 hyperintensity are seen within the white matter of the cerebral hemispheres, nonspecific. No hemorrhage, hydrocephalus, mass or midline shift. Vascular: Abnormal flow void in the V4 segment of the left vertebral artery may represent slow flow versus occlusion. Skull and upper cervical spine: Normal marrow signal. Sinuses/Orbits: Negative. IMPRESSION: 1. Acute infarct involving the right superior cerebellar peduncle and right middle cerebellar peduncle. 2. Edema in the right cerebellar  hemisphere from subacute infarct seen on prior MRI. 3. Mild to moderate chronic white matter disease. Electronically Signed   By: Baldemar Lenis M.D.   On: 12/26/2020 15:06   CT HEAD CODE STROKE WO CONTRAST  Result Date: 12/26/2020 CLINICAL DATA:  Code stroke.  New onset confusion. EXAM: CT HEAD WITHOUT CONTRAST TECHNIQUE: Contiguous axial images were obtained from the base of the skull through the vertex without intravenous contrast. COMPARISON:  Head CT 12/25/2020 and MRI 12/17/2020 FINDINGS: Brain: A subacute infarct in the superior right cerebellum is unchanged, and a chronic infarct is again noted inferiorly in the left cerebellum. No new infarct, intracranial hemorrhage, mass, midline shift, or extra-axial fluid collection is identified. Hypodensities in the cerebral white matter bilaterally are unchanged and nonspecific but compatible with mild-to-moderate chronic small vessel ischemic disease. The ventricles and sulci are within normal limits for age. Vascular: Calcified atherosclerosis at the skull base. No suspicious vessel hyperdensity. Skull: No fracture or suspicious osseous lesion. Sinuses/Orbits: Visualized paranasal sinuses and mastoid air cells are clear. Remote medial left orbital fracture. Other: None. ASPECTS [facility] Stroke Program Early CT Score) Not scored with this history. IMPRESSION: 1. No evidence of acute intracranial abnormality. 2. 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 3. Mild-to-moderate chronic small vessel ischemic  disease. These results were communicated to Dr. Roda Shutters at 2:16 pm on 12/26/2020 by text page via the [facility] messaging system. Electronically Signed   By: Sebastian Ache M.D.   On: 12/26/2020 14:18      Physical Exam   Temp:  [97.9 F (36.6 C)-98.2 F (36.8 C)] 97.9 F (36.6 C) (01/26 1709) Pulse Rate:  [79-90] 82 (01/26 1709) Resp:  [18-20] 20 (01/26 1709) BP: (99-129)/(65-96) 114/65 (01/26 1709) SpO2:  [95 %-100 %] 100 % (01/26  1709)  General - Well nourished, well developed, agitated and restless.  Ophthalmologic - fundi not visualized due to noncooperation.  Cardiovascular - Regular rhythm and rate.  Neuro - awake, eyes open, restless, easily distractible, moderate to severe dysarthria with occasional intangible words. He was able to speak in words, likely "headache" "a while" "tylenol" but not able to speak in sentences. Follow limited simple commands. Not cooperative for naming or repetition. Tracking bilaterally, blinking to visual threat bilaterally, no facial asymmetry. RUE 3-/5 with drift to bed before 10 sec, significant ataxia with RUE movement. Moving LUE and LLE strong, RLE withdraw to pain briskly but seems weaker than left. Sensation, coordination not cooperative and gait not tested.   ASSESSMENT/PLAN Mr. Azahel Rish is a 55 y.o. male with a questionable PMHx of CHF and DM II per bystanders who presents to [facility] ED via EMS after chest pain complaint followed by unresponsiveness followed by unresponsiveness and vomiting and EMS noticed right-sided movements but left gaze deviation and left hemiparesis. CT scans showed subacute left cerebellar infarct with possible dense basilar artery sign but CT angiogram was of poor quality due to timing of dye injection hence patient was taken for emergent cerebral catheter angiogram and was intubated for airway protection. IV tPA considered but not given because diagnosis of stroke was not certain given lack of significant focal findings except gaze deviation and CT scan showing a subacute cerebellar infarct patient unable to give clear history. No LVO on diagnostic angiogram. MRI shows acute ischemic nonhemorrhagic right superior cerebellar artery.   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  MRI:  Acute ischemic right SCA infarct   CT head 1/26 no change, subacute right and chronic left cerebellar infarcts  MRI repeat 1/26 pending  2D Echo: EF <20%. The left ventricle has severely decreased function.   LDL : 72  HgbA1c: 6.4  VTE prophylaxis - SCDs  Unavailable  prior to admission, now on aspirin 81 mg daily and plavix 75 DAPT for 3 weeks and then ASA alone.    Therapy recommendations:SNF  Disposition: Pending  ? Seizure   EEG 1/17 - severe diffuse encephalopathy, nonspecific etiology  EEG 1/19 - evidence of epileptogenicity arising from right>left frontal region.  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 Long-term BP goal normotensive  Hyperlipidemia  Home meds: Lipitor 40 mg  On Lipitor 40 mg  LDL: 72  Continue statin at discharge  Diabetes type II   Home meds: None  HgbA1c: 6.4  CBGs  SSI  Close PCP follow up  Cocaine abuse  UDS + cocaine  Cessation counseling provided  Pt is willing to quit   Other Stroke Risk Factors  Systolic and diastolic congestive heart failure  Other Active Problems    Patient condition worsened with AMS and code stroke activated. I spent  35 minutes in total face-to-face time with the patient, more than 50% of which was spent in counseling and coordination of care, reviewing test  results, images and medication, and discussing the diagnosis, treatment plan and potential prognosis. This patient's care requiresreview of multiple databases, neurological assessment, discussion with family, other specialists and medical decision making of high complexity. I discussed with Dr. Marland Mcalpine and radiology Dr. Sherlon Handing.   Marvel Plan, MD PhD Stroke Neurology 12/26/2020 5:57 PM      To contact Stroke Continuity provider, please refer to [email or web address]. After hours, contact General Neurology

## 2020-12-26 NOTE — Progress Notes (Signed)
EEG Completed; Results Pending  

## 2020-12-26 NOTE — Progress Notes (Signed)
PROGRESS NOTE    [name]  GQQ:[id number] DOB: 25-Feb-1966 DOA: 12/17/2020 PCP: [name]   Brief Narrative:  The patient is a 55 year old gentleman, initially with unknown identity brought to the ER after he complained of chest pain and became unresponsive with left-sided weakness and left gaze deviation. Was intubated in the ER. Apparently seen by bystanders when patient complained of chest pain and all of a sudden he became unresponsive. 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 1/17-1/20, admitted to ICU, extubated to 4 L oxygen on 1/20. 1/21, transferred to [facility] service, progressive bed. Patient has different MRN in our hospital system, he has frequent hospitalization and was recently admitted and discharged a week ago with diabetic toe ulcer. Review his previous records indicate that he is homeless, he is noncompliant, uses cocaine. He has a history of COPD, bipolar 1 disorder, known chronic heart failure with recent ejection fraction 40%, ischemic heart disease and previously declined PCI.  Significant diagnostics: 1/17 CT head: Left cerebellar hypodensity concerning for acute/subacute infarct. No intracranial hemorrhage. Small right parietal scalp hematoma 1/17 CTA head and neck: Non opacification of the left vertebral artery with retrograde filling of the distal left V4 segment. Bilateral carotid bifurcation atheromatous disease with 50% right and 30% left carotid bulb narrowing. Mild left P2 segment narrowing. 2D echo 1/18>>>EF <20%, hypokinesis, mod LVH, grade 2 diastolic dysfunction,  EEG 1/19>>This studyshowed evidence of epileptogenicity arising fromright>leftfrontal region. There is also moderatediffuse encephalopathy, nonspecific etiology butcould be secondary tosedation.No seizures were seen throughout the  recording.  Because the patient continued to have worsening neurological symptoms today became unresponsive a stat head CT was obtained again and he underwent EEG monitoring.  Code stroke was called today by the nursing.  EEG was within normal limits.  Patient did admit to having some suicidal ideation so he was placed on a one-to-one sitter and psychiatry is consulted for further evaluation.   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  Liver function tests normalized. A1c 6.4 EEG with diffuse encephalopathy Patient was treated with heparin.  Now on aspirin and Plavix for 3 weeks then aspirin alone. 1/25, patient complained of persistent right-sided weakness that he claims worsening,  -He had a repeat head CT yesterday and again had some neurological deficits today still repeat head code stroke was called today. -Underwent a repeat MRI as well as an EEG and neurology is following.  Acute on chronic systolic and diastolic congestive heart failure: Recent echocardiogram with ejection fraction 40% New echocardiogram ejection fraction less than 20%. On aspirin, Lipitor, Entresto, aldactone, coreg and Imdur and oral lasix.  Kidney functions are stable. Patient is noncompliant and will be difficult to treat.  Questionable seizure activity:  No more seizure activities.  Neurology recommended no AEDs at this time but then he became altered again this afternoon so neurology repeated a stat EEG and placed him on low-dose Keppra 500 twice daily. He has impulsiveness and some tremors, unlikely seizure.  Acute hypoxemic respiratory  failure:  Secondary to stroke.  Currently on room air.  Acute metabolic toxic encephalopathy:  -Multifactorial.  Stroke, drug use, acute respiratory failure and sedation.  Stabilizing. Patient has baseline behavior problems and intermittent agitations, aggressiveness towards the staff.  He endorsed some psychotic symptoms.  Seen by psychiatry and cleared for any lethality. Will need placement to skilled nursing facility.  Acute liver failure: Improved.  Probably due to ischemic liver.  Cocaine use: Counseled to quit.  Bipolar disorder/homelessness: Poor compliance. Homelessness.  Consulted psychiatry.    Admits to having some suicidal ideation so we will place him on a one-to-one sitter  Deconditioning  Continue mobilizing.  Referred to SNF. Patient is throwing away his telemetry and taking off his IV lines, discontinue telemetry.  Transfer to MedSurg bed. Transfer to skilled nursing facility if available per social work placement will be extremely difficult.  Bilateral leg ulcers: Probably ischemic leg ulcers.  Local wound care.  With acute a stroke, behavioral problems, not a surgical candidate at this time.  DVT prophylaxis: SCDs Code Status: FULL CODE Family Communication: No family present at bedside Disposition Plan: SNF when able  Status is: Inpatient  Remains inpatient appropriate because:Unsafe d/c plan, IV treatments appropriate due to intensity of illness or inability to take PO and Inpatient level of care appropriate due to severity of illness   Dispo: The patient is from: Home              Anticipated d/c is to: SNF              Anticipated d/c date is: 3 days              Patient currently is not medically stable to d/c.   Difficult to place patient Yes  Consultants:   Neurology  Theology   Procedures: See above  Repeat EEG 12/26/2020: This study is within normal limits. No seizures or epileptiform discharges were seen throughout the  recording.  Antimicrobials:  Anti-infectives (From admission, onward)   Start     Dose/Rate Route Frequency Ordered Stop   12/17/20 1450  ceFAZolin (ANCEF) 2-4 GM/100ML-% IVPB       Note to Pharmacy: Cristy Friedlander   : cabinet override      12/17/20 1450 12/17/20 1911        Subjective: Examination states that he is not feeling as well but wanted to get up in his wheelchair today.  No nausea or vomiting.  He then became altered later this afternoon a code stroke was called.  Prior to this he had no nausea or vomiting.  Denies any other concerns or complaints at this time and still has difficulty moving his right side.  Objective: Vitals:   12/25/20 0800 12/25/20 1200 12/25/20 1645 12/26/20 0756  BP: 126/90 (!) 118/91 (!) 131/91 121/84  Pulse: 84 86  88  Resp: 16 18  18   Temp: 98.5 F (36.9 C)  98 F (36.7 C)   TempSrc: Oral  Oral   SpO2: 99% 99%    Weight:      Height:        Intake/Output Summary (Last 24 hours) at 12/26/2020 0102 Last data filed at 12/25/2020 1700 Gross per 24 hour  Intake 240 ml  Output 400 ml  Net -160 ml   Filed Weights   12/21/20 0425 12/22/20 0308 12/23/20 0500  Weight: 94.5 kg 89.2 kg 85.1 kg   Examination: Physical Exam:  Constitutional: WN/WD overweight African-American male who is slightly disheveled currently appears slightly uncomfortable Eyes: Lids and  conjunctivae normal, sclerae anicteric  ENMT: External Ears, Nose appear normal. Grossly normal hearing. Neck: Appears normal, supple, no cervical masses, normal ROM, no appreciable thyromegaly; no JVD Respiratory: Diminished to auscultation bilaterally, no wheezing, rales, rhonchi or crackles. Normal respiratory effort and patient is not tachypenic. No accessory muscle use .  Cardiovascular: RRR, no murmurs / rubs / gallops. S1 and S2 auscultated.  Trace extremity edema Abdomen: Soft, non-tender, distended secondary body habitus. Bowel sounds positive.  GU: Deferred. Musculoskeletal: No  clubbing / cyanosis of digits/nails.  Has some bilateral toe ulcers Skin: Has some ulcers on his big toes noted warm and dry.  Neurologic: CN 2-12 grossly intact with no focal deficits but he does have some dysarthria and does have some right upper medial weakness as well as some right leg weakness. Psychiatric: Slightly impaired judgment and insight. Alert and oriented x 2. Normal mood and appropriate affect.   Data Reviewed: I have personally reviewed following labs and imaging studies  CBC: Recent Labs  Lab 12/20/20 0527 12/21/20 0100 12/22/20 0108  WBC 7.0 8.9 6.9  HGB 11.1* 12.5* 12.7*  HCT 32.8* 36.9* 37.8*  MCV 98.8 98.1 96.2  PLT 261 291 289   Basic Metabolic Panel: Recent Labs  Lab 12/19/20 1418 12/20/20 0827 12/23/20 0109 12/24/20 0433  NA  --  139 136 137  K  --  4.0 3.6 4.0  CL  --  110 100 102  CO2  --  21* 26 25  GLUCOSE  --  148* 103* 99  BUN  --  16 9 9   CREATININE  --  1.02 0.94 0.99  CALCIUM  --  7.9* 8.6* 8.8*  MG 1.9  --  1.9 1.8  PHOS 3.4  --  4.3  --    GFR: Estimated Creatinine Clearance: 94.4 mL/min (by C-G formula based on SCr of 0.99 mg/dL). Liver Function Tests: Recent Labs  Lab 12/21/20 0829  AST 55*  ALT 147*  ALKPHOS 91  BILITOT 0.7  PROT 6.3*  ALBUMIN 2.3*   No results for input(s): LIPASE, AMYLASE in the last 168 hours. No results for input(s): AMMONIA in the last 168 hours. Coagulation Profile: No results for input(s): INR, PROTIME in the last 168 hours. Cardiac Enzymes: No results for input(s): CKTOTAL, CKMB, CKMBINDEX, TROPONINI in the last 168 hours. BNP (last 3 results) No results for input(s): PROBNP in the last 8760 hours. HbA1C: No results for input(s): HGBA1C in the last 72 hours. CBG: Recent Labs  Lab 12/24/20 0820 12/24/20 1959 12/25/20 1136 12/25/20 1652 12/26/20 0755  GLUCAP 183* 117* 176* 120* 159*   Lipid Profile: No results for input(s): CHOL, HDL, LDLCALC, TRIG, CHOLHDL, LDLDIRECT in the last 72  hours. Thyroid Function Tests: No results for input(s): TSH, T4TOTAL, FREET4, T3FREE, THYROIDAB in the last 72 hours. Anemia Panel: No results for input(s): VITAMINB12, FOLATE, FERRITIN, TIBC, IRON, RETICCTPCT in the last 72 hours. Sepsis Labs: No results for input(s): PROCALCITON, LATICACIDVEN in the last 168 hours.  Recent Results (from the past 240 hour(s))  Resp Panel by RT-PCR (Flu A&B, Covid) Nasopharyngeal Swab     Status: None   Collection Time: 12/17/20  2:07 PM   Specimen: Nasopharyngeal Swab; Nasopharyngeal(NP) swabs in vial transport medium  Result Value Ref Range Status   SARS Coronavirus 2 by RT PCR NEGATIVE NEGATIVE Final    Comment: (NOTE) SARS-CoV-2 target nucleic acids are NOT DETECTED.  The SARS-CoV-2 RNA is generally detectable in upper respiratory specimens during the acute phase  of infection. The lowest concentration of SARS-CoV-2 viral copies this assay can detect is 138 copies/mL. A negative result does not preclude SARS-Cov-2 infection and should not be used as the sole basis for treatment or other patient management decisions. A negative result may occur with  improper specimen collection/handling, submission of specimen other than nasopharyngeal swab, presence of viral mutation(s) within the areas targeted by this assay, and inadequate number of viral copies(<138 copies/mL). A negative result must be combined with clinical observations, patient history, and epidemiological information. The expected result is Negative.  Fact Sheet for Patients:  BloggerCourse.com  Fact Sheet for Healthcare Providers:  SeriousBroker.it  This test is no t yet approved or cleared by the [place] FDA and  has been authorized for detection and/or diagnosis of SARS-CoV-2 by FDA under an Emergency Use Authorization (EUA). This EUA will remain  in effect (meaning this test can be used) for the duration of the COVID-19  declaration under Section 564(b)(1) of the Act, 21 U.S.C.section 360bbb-3(b)(1), unless the authorization is terminated  or revoked sooner.       Influenza A by PCR NEGATIVE NEGATIVE Final   Influenza B by PCR NEGATIVE NEGATIVE Final    Comment: (NOTE) The Xpert Xpress SARS-CoV-2/FLU/RSV plus assay is intended as an aid in the diagnosis of influenza from Nasopharyngeal swab specimens and should not be used as a sole basis for treatment. Nasal washings and aspirates are unacceptable for Xpert Xpress SARS-CoV-2/FLU/RSV testing.  Fact Sheet for Patients: BloggerCourse.com  Fact Sheet for Healthcare Providers: SeriousBroker.it  This test is not yet approved or cleared by the [place] FDA and has been authorized for detection and/or diagnosis of SARS-CoV-2 by FDA under an Emergency Use Authorization (EUA). This EUA will remain in effect (meaning this test can be used) for the duration of the COVID-19 declaration under Section 564(b)(1) of the Act, 21 U.S.C. section 360bbb-3(b)(1), unless the authorization is terminated or revoked.  Performed at [facility], [address], [place], [name] [address]   MRSA PCR Screening     Status: None   Collection Time: 12/17/20  6:02 PM   Specimen: Nasal Mucosa; Nasopharyngeal  Result Value Ref Range Status   MRSA by PCR NEGATIVE NEGATIVE Final    Comment:        The GeneXpert MRSA Assay (FDA approved for NASAL specimens only), is one component of a comprehensive MRSA colonization surveillance program. It is not intended to diagnose MRSA infection nor to guide or monitor treatment for MRSA infections. Performed at [facility], [address], [place], [name] [address]     RN Pressure Injury Documentation:     Estimated body mass index is 27.71 kg/m as calculated from the following:   Height as of this encounter: 5\' 9"  (1.753 m).   Weight as of this encounter:  85.1 kg.  Malnutrition Type:  Nutrition Problem: Inadequate oral intake Etiology: inability to eat  Malnutrition Characteristics:  Signs/Symptoms: NPO status   Nutrition Interventions:  Interventions: Prostat,MVI,Ensure Enlive (each supplement provides 350kcal and 20 grams of protein)   Radiology Studies: CT HEAD WO CONTRAST  Result Date: 12/25/2020 CLINICAL DATA:  Stroke. Persistent or worsening mental status change. EXAM: CT HEAD WITHOUT CONTRAST TECHNIQUE: Contiguous axial images were obtained from the base of the skull through the vertex without intravenous contrast. COMPARISON:  CT head without contrast 12/19/2020. MR head 12/17/2020. FINDINGS: Brain: The right cerebellar infarct is beginning to normalize and is less distinct than on the  prior exams. No new or progressive infarct is present. No mass effect is present. More remote left inferior cerebellar infarct is stable. No acute supratentorial infarct is present. No hemorrhage or mass lesion is present. The ventricles are of normal size. No significant extraaxial fluid collection is present. Vascular: Minimal atherosclerotic calcifications are present within the cavernous internal carotid arteries bilaterally. No hyperdense vessel is present. Skull: Calvarium is intact. No focal lytic or blastic lesions are present. No significant extracranial soft tissue lesion is present. Sinuses/Orbits: The paranasal sinuses and mastoid air cells are clear. Remote left orbital blowout fracture is again seen. Globes and orbits are within normal limits. IMPRESSION: 1. Expected evolution of right cerebellar infarct. 2. Stable remote left inferior cerebellar infarct. 3. No acute intracranial abnormality or other significant interval change. Electronically Signed   By: Marin Roberts M.D.   On: 12/25/2020 12:16   Scheduled Meds: . (feeding supplement) PROSource Plus  30 mL Oral BID BM  . aspirin  81 mg Oral Daily  . atorvastatin  40 mg Oral Daily   . carvedilol  6.25 mg Oral BID WC  . Chlorhexidine Gluconate Cloth  6 each Topical Daily  . clopidogrel  75 mg Oral Daily  . divalproex  500 mg Oral Daily  . feeding supplement  237 mL Oral BID BM  . furosemide  40 mg Oral Daily  . hydrALAZINE  25 mg Oral TID  . isosorbide mononitrate  30 mg Oral Daily  . magnesium oxide  400 mg Oral BID  . multivitamin with minerals  1 tablet Oral Daily  . polyethylene glycol  17 g Per Tube Daily  . sacubitril-valsartan  1 tablet Oral BID  . silver sulfADIAZINE   Topical BID  . spironolactone  12.5 mg Oral Daily   Continuous Infusions:   LOS: 9 days   Merlene Laughter, DO [facility] PAGER is on AMION  If 7PM-7AM, please contact night-coverage [email or web address]

## 2020-12-26 NOTE — Progress Notes (Signed)
Patient refused vital signs, Nurse notified

## 2020-12-26 NOTE — Progress Notes (Signed)
Patient refused for his vital signs to be taken. Nurse was notified

## 2020-12-26 NOTE — Progress Notes (Signed)
Code Stroke called on patient at 1330. RN called to bedside by rehab around 1325 after patient displayed new onset confusion and AMS, disoriented, not following commands or answering questions. Team paged. V/s stable, CBG 148. Ativan 2mg  IV given per verbal MD order. Patient taken for Stat CT scan.

## 2020-12-26 NOTE — Progress Notes (Signed)
Patient is currently agitated, refusing vital signs from sitter and nurse tech. Charge RN and MD is aware.

## 2020-12-26 NOTE — Code Documentation (Addendum)
Stroke Response Nurse Documentation Code Documentation  Kylan Liberati is a 55 y.o. male admitted to [facility] for management of acute stroke. Code stroke was activated by primary RN. Patient LKW at 1130. At 1325 pt developed sudden onset of confusion, AMS, disorientation and not following commands. Stroke team at the bedside. Patient to CT with team. NIHSS 13, see documentation for details and code stroke times. Patient with disoriented, right hemianopia, right arm weakness, right leg weakness, right decreased sensation, Global aphasia  and dysarthria  on exam. Pt restless, agitated during exam, 2mg  IV Ativan given prior to CT. The following imaging was completed: CT, MRI.   Care/Plan: q2h NIHSS and VS Bedside handoff with ., RN  Ferdie Ping  Rapid Response RN

## 2020-12-26 NOTE — Procedures (Signed)
Patient Name: [name]  MRN: [id number]  Epilepsy Attending: Charlsie Quest  Referring Physician/Provider: Dr Marvel Plan Date: 12/26/2020 Duration: 23.42 mins  Patient history: 54yo M with ams. EEG to evaluate for seizure  Level of alertness: Awake, drowsy, sleep, comatose, lethargic  AEDs during EEG study: VPA, LEV  Technical aspects: This EEG study was done with scalp electrodes positioned according to the 10-20 International system of electrode placement. Electrical activity was acquired at a sampling rate of 500Hz  and reviewed with a high frequency filter of 70Hz  and a low frequency filter of 1Hz . EEG data were recorded continuously and digitally stored.   Description: The posterior dominant rhythm consists of 8 Hz activity of moderate voltage (25-35 uV) seen predominantly in posterior head regions, symmetric and reactive to eye opening and eye closing. Hyperventilation and photic stimulation were not performed.     IMPRESSION: This study is within normal limits. No seizures or epileptiform discharges were seen throughout the recording.  [name] 

## 2020-12-27 DIAGNOSIS — R45851 Suicidal ideations: Secondary | ICD-10-CM

## 2020-12-27 DIAGNOSIS — I5043 Acute on chronic combined systolic (congestive) and diastolic (congestive) heart failure: Secondary | ICD-10-CM | POA: Diagnosis not present

## 2020-12-27 DIAGNOSIS — R569 Unspecified convulsions: Secondary | ICD-10-CM | POA: Diagnosis not present

## 2020-12-27 DIAGNOSIS — I6300 Cerebral infarction due to thrombosis of unspecified precerebral artery: Secondary | ICD-10-CM | POA: Diagnosis not present

## 2020-12-27 DIAGNOSIS — I63441 Cerebral infarction due to embolism of right cerebellar artery: Secondary | ICD-10-CM | POA: Diagnosis not present

## 2020-12-27 LAB — COMPREHENSIVE METABOLIC PANEL
ALT: 42 U/L (ref 0–44)
AST: 23 U/L (ref 15–41)
Albumin: 2.8 g/dL — ABNORMAL LOW (ref 3.5–5.0)
Alkaline Phosphatase: 77 U/L (ref 38–126)
Anion gap: 11 (ref 5–15)
BUN: 21 mg/dL — ABNORMAL HIGH (ref 6–20)
CO2: 22 mmol/L (ref 22–32)
Calcium: 9.2 mg/dL (ref 8.9–10.3)
Chloride: 103 mmol/L (ref 98–111)
Creatinine, Ser: 1.1 mg/dL (ref 0.61–1.24)
GFR, Estimated: >60 mL/min (ref >60)
Glucose, Bld: 113 mg/dL — ABNORMAL HIGH (ref 70–99)
Potassium: 4.5 mmol/L (ref 3.5–5.1)
Sodium: 136 mmol/L (ref 135–145)
Total Bilirubin: 0.2 mg/dL — ABNORMAL LOW (ref 0.3–1.2)
Total Protein: 7.5 g/dL (ref 6.5–8.1)

## 2020-12-27 LAB — CBC WITH DIFFERENTIAL/PLATELET
Abs Immature Granulocytes: 0.01 10*3/uL (ref 0.00–0.07)
Basophils Absolute: 0.1 10*3/uL (ref 0.0–0.1)
Basophils Relative: 1 %
Eosinophils Absolute: 0.2 10*3/uL (ref 0.0–0.5)
Eosinophils Relative: 3 %
HCT: 46.5 % (ref 39.0–52.0)
Hemoglobin: 15.1 g/dL (ref 13.0–17.0)
Immature Granulocytes: 0 %
Lymphocytes Relative: 41 %
Lymphs Abs: 2.5 10*3/uL (ref 0.7–4.0)
MCH: 31 pg (ref 26.0–34.0)
MCHC: 32.5 g/dL (ref 30.0–36.0)
MCV: 95.5 fL (ref 80.0–100.0)
Monocytes Absolute: 1.1 10*3/uL — ABNORMAL HIGH (ref 0.1–1.0)
Monocytes Relative: 18 %
Neutro Abs: 2.2 10*3/uL (ref 1.7–7.7)
Neutrophils Relative %: 37 %
Platelets: 394 10*3/uL (ref 150–400)
RBC: 4.87 MIL/uL (ref 4.22–5.81)
RDW: 14.6 % (ref 11.5–15.5)
WBC: 6 10*3/uL (ref 4.0–10.5)
nRBC: 0 % (ref 0.0–0.2)

## 2020-12-27 LAB — PHOSPHORUS: Phosphorus: 4.7 mg/dL — ABNORMAL HIGH (ref 2.5–4.6)

## 2020-12-27 LAB — GLUCOSE, CAPILLARY
Glucose-Capillary: 107 mg/dL — ABNORMAL HIGH (ref 70–99)
Glucose-Capillary: 185 mg/dL — ABNORMAL HIGH (ref 70–99)
Glucose-Capillary: 167 mg/dL — ABNORMAL HIGH (ref 70–99)

## 2020-12-27 LAB — MAGNESIUM: Magnesium: 2 mg/dL (ref 1.7–2.4)

## 2020-12-27 NOTE — Progress Notes (Signed)
Patient took off telemetry/cardiac monitoring and is experiencing agitation. Wants to be left alone. Charge RN and MD are aware.

## 2020-12-27 NOTE — Progress Notes (Signed)
  Speech Language Pathology Treatment: Dysphagia;Cognitive-Linquistic  Patient Details Name: [name] MRN: [id number] DOB: January 02, 1966 Today's Date: 12/27/2020 Time: 8413-2440 SLP Time Calculation (min) (ACUTE ONLY): 25 min  Assessment / Plan / Recommendation Clinical Impression  Followed up for diet tolerance and cognitive intervention. Pt agreeable to regular consistency snack, though declined thin liquids despite encouragement. Pt able to adequately masticate regular textures and was without overt difficulty. Continue regular thin liquid diet . SLP directed for improved orientation to environment with use of environmental visual aids. Despite visual and verbal cues pt very distracted, mumbling jargon speech. Unsure if current speech presentation is related to deficits from acute CVA vs psychiatric hx. Pt with cluttering like presentation with speech. Pt oriented to call light, able to press button, but didn't exhibit functional and appropriate use. SLP to continue to follow during acute stay.    HPI HPI: 55 yr old [place] and became unresponsive with left-sided weakness and left gaze deviation. 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 Intubated 1/17-1/20.      SLP Plan  Continue with current plan of care       Recommendations  Diet recommendations: Regular;Thin liquid Liquids provided via: Cup;Straw Medication Administration: Whole meds with puree Supervision: Patient able to self feed;Staff to assist with self feeding;Intermittent supervision to cue for compensatory strategies Compensations: Minimize environmental distractions;Slow rate;Small sips/bites Postural Changes and/or Swallow Maneuvers: Seated upright 90 degrees;Upright 30-60 min after meal                 Oral Care Recommendations: Oral care BID Follow up Recommendations: Skilled Nursing facility SLP Visit Diagnosis: Cognitive communication deficit (R41.841);Dysphagia, unspecified (R13.10) Plan: Continue with current plan of care       GO                Ardyth Gal MA, CCC-SLP Acute Rehabilitation Services   12/27/2020, 2:18 PM

## 2020-12-27 NOTE — Progress Notes (Signed)
Pt is currently refusing CBGs and vital signs at this time. Will check with patient later about checking vitals. Pt states "I just want to lay here and be left alone"

## 2020-12-27 NOTE — Consult Note (Signed)
  Patient seen and assessed by this nurse practitioner.  Patient is observed to be rolling around in bed, as is he is in pain.  His speech appears to be delayed, and has difficulty phonating and pronouncing words.  He is able to verbalize" today is not a good day to talk."  Writer expressed understanding, however wanted to get additional information about his suicidal thoughts.  At which time he denied any suicidal thoughts today.  He states" they come and go.  But none right now."  Patient continues to shuffle around in pain.  At this time patient is denying suicidal ideations, homicidal ideations, and or auditory visual hallucinations.  He endorses his biggest complaint today is his pain.  As previously noted patient does have a history of intermittent suicidal ideations that are chronic in nature, and he has no documented history of an actual suicide attempt in his chart.  Patient does not meet criteria for inpatient psychiatric hospitalization at this time.  We will also recommend discontinuing safety sitter as patient is denying suicidal ideations.  We will continue with current medications. -No evidence of imminent risk to self or others at this time. -Patient does not meet criteria for inpatient admission. -May discontinue safety sitter, as patient is denying suicidal ideations. -Continue working closely with social work to provide assistance with outpatient resources for behavioral health management, in the event patient adheres to treatment recommendations.  Patient has a history of noncompliance and adherence, and ongoing barriers to establishing mental health services. -Continue working closely with palliative medicine team to identify goals of care, as patient is requesting to leave despite his ongoing needs for medical interventions and evaluation(recent work-up for stroke x 2).

## 2020-12-27 NOTE — Progress Notes (Signed)
°   12/27/20 1637  Vitals  Temp 97.8 F (36.6 C)  Temp Source Oral  BP (!) 89/61  MAP (mmHg) 70  BP Location Left Arm  BP Method Automatic  Patient Position (if appropriate) Lying  Pulse Rate 89  Pulse Rate Source Monitor  Resp 18  MEWS COLOR  MEWS Score Color Green  Oxygen Therapy  SpO2 100 %  O2 Device Room Air  MEWS Score  MEWS Temp 0  MEWS Systolic 1  MEWS Pulse 0  MEWS RR 0  MEWS LOC 0  MEWS Score 1   MD aware and RN will continue to watch pt and recheck BP when the pt allows.

## 2020-12-27 NOTE — Progress Notes (Addendum)
STROKE TEAM PROGRESS NOTE   INTERVAL HISTORY No further acute events overnight.   He was placed on suicide precautions with psychiatry consult pending.  Patient denies new symptoms or concerns. I explained stroke diagnosis. He denied questions. He declined assistance with repositioning in bed from myself and CNA.   Vitals:   12/26/20 1457 12/26/20 1709 12/27/20 0829 12/27/20 1410  BP: 99/75 114/65 112/72 90/65  Pulse: 85 82 89 82  Resp: 20 20 20    Temp: 98 F (36.7 C) 97.9 F (36.6 C) 97.8 F (36.6 C) 97.8 F (36.6 C)  TempSrc: Axillary Oral Oral Oral  SpO2: 95% 100% 94% 100%  Weight:      Height:       CBC:  Recent Labs  Lab 12/26/20 0848 12/27/20 0304  WBC 5.3 6.0  NEUTROABS 2.4 2.2  HGB 14.1 15.1  HCT 43.6 46.5  MCV 95.4 95.5  PLT 374 394   Basic Metabolic Panel:  Recent Labs  Lab 12/26/20 0848 12/27/20 0304  NA 134* 136  K 4.0 4.5  CL 98 103  CO2 25 22  GLUCOSE 161* 113*  BUN 19 21*  CREATININE 1.01 1.10  CALCIUM 8.7* 9.2  MG 2.0 2.0  PHOS 4.3 4.7*   Lipid Panel: LDL 72 HgbA1c: 6.4 Urine Drug Screen:  + cocaine 12/17/20  IMAGING past 24 hours  EEG  12/19/2020  EEG showed continuous generalized 6 to 7 Hz theta slowing.  Right >left frontal spikes were also noted, maximalHyperventilation and photic stimulation were not performed.   CT Head wo contrast 12/19/2020  IMPRESSION: Acute infarct right superior cerebellum unchanged from MRI.  CT head Code stroke wo contrast 12/17/2020  IMPRESSION: 1. Left cerebellar hypodensity concerning for acute/subacute infarct. 2. No intracranial hemorrhage. 3. ASPECTS is 10 4. Small right parietal scalp hematoma.  CT Angio Head Neck w or wo contrast 12/17/2020  IMPRESSION: Non opacification of the left vertebral artery with retrograde filling of the distal left V4 segment.   Bilateral carotid bifurcation atheromatous disease with 50% right and 30% left carotid bulb narrowing.   Mild left P2 segment  narrowing.   Sub optimal contrast bolus timing limits evaluation.  X-ray Abdomen 12/17/2020  IMPRESSION: Esophageal tube tip overlies the proximal stomach.   MRA Head 12/18/2020  IMPRESSION: MRI HEAD IMPRESSION:   1. 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. No associated mass effect. 2. Chronic left PICA distribution infarct. Abnormal flow void within the left V4 segment consistent with slow flow as seen on corresponding MRA. 3. Underlying mild-to-moderate chronic microvascular ischemic disease.   MRA HEAD IMPRESSION:   1. Acute occlusion of the right superior cerebellar artery. 2. Attenuated but patent flow within the left V4 segment, suggesting a proximal stenosis. 3. Otherwise wide patency of the major intracranial arterial circulation. No other hemodynamically significant or correctable Stenosis  Echocardiogram 12/18/2020  IMPRESSIONS     1. Left ventricular ejection fraction, by estimation, is <20%. The left  ventricle has severely decreased function. The left ventricle demonstrates  global hypokinesis. The left ventricular internal cavity size was  moderately dilated. There is moderate  left ventricular hypertrophy. Left ventricular diastolic parameters are  consistent with Grade II diastolic dysfunction (pseudonormalization).  Elevated left atrial pressure.   2. Right ventricular systolic function is moderately reduced. The right  ventricular size is mildly enlarged.   3. Left atrial size was moderately dilated.   4. Right atrial size was moderately dilated.   5. The mitral valve is  normal in structure. Trivial mitral valve  regurgitation. No evidence of mitral stenosis.   6. The aortic valve is tricuspid. Aortic valve regurgitation is not  visualized. Mild aortic valve sclerosis is present, with no evidence of  aortic valve stenosis.   7. The inferior vena cava is normal in size with greater than 50%  respiratory  variability, suggesting right atrial pressure of 3 mmHg.   EEG adult  Result Date: 12/26/2020 Charlsie Quest, MD     12/26/2020  5:46 PM Patient Name: [name] MRN: [id number] Epilepsy Attending: Charlsie Quest Referring Physician/Provider: Dr Marvel Plan Date: 12/26/2020 Duration: 23.42 mins Patient history: 54yo M with ams. EEG to evaluate for seizure Level of alertness: Awake, drowsy, sleep, comatose, lethargic AEDs during EEG study: VPA, LEV Technical aspects: This EEG study was done with scalp electrodes positioned according to the 10-20 International system of electrode placement. Electrical activity was acquired at a sampling rate of 500Hz  and reviewed with a high frequency filter of 70Hz  and a low frequency filter of 1Hz . EEG data were recorded continuously and digitally stored. Description: The posterior dominant rhythm consists of 8 Hz activity of moderate voltage (25-35 uV) seen predominantly in posterior head regions, symmetric and reactive to eye opening and eye closing. Hyperventilation and photic stimulation were not performed.   IMPRESSION: This study is within normal limits. No seizures or epileptiform discharges were seen throughout the recording.   MR Brain 1/26  IMPRESSION: 1. Acute infarct involving the right superior cerebellar peduncle and right middle cerebellar peduncle. 2. Edema in the right cerebellar hemisphere from subacute infarct seen on prior MRI. 3. Mild to moderate chronic white matter disease  CT Head 1/27 FINDINGS: Brain: A subacute infarct in the superior right cerebellum is unchanged, and a chronic infarct is again noted inferiorly in the left cerebellum. No new infarct, intracranial hemorrhage, mass, midline shift, or extra-axial fluid collection is identified. Hypodensities in the cerebral white matter bilaterally are unchanged and nonspecific but compatible with mild-to-moderate chronic small vessel ischemic disease. The  ventricles and sulci are within normal limits for age. Physical Exam   Temp:  [97.8 F (36.6 C)-97.9 F (36.6 C)] 97.8 F (36.6 C) (01/27 1410) Pulse Rate:  [82-89] 82 (01/27 1410) Resp:  [20] 20 (01/27 0829) BP: (90-114)/(65-72) 90/65 (01/27 1410) SpO2:  [94 %-100 %] 100 % (01/27 1410)  General - Well nourished, well developed male lying in bed. Calm but not consistently cooperative.  Neuro - awake, eyes open, restless, easily distractible, moderate to severe dysarthria with occasional intangible words. He does not a name or repeat on command but states "that is just how it is" and " I know that is right" during conversation.  Follows limited simple commands. Tracking bilaterally, blinking to visual threat bilaterally, no facial asymmetry. RUE 3-/5 with drift to bed before 10 sec, significant ataxia with RUE movement. Moving LUE and LLE 5/5, RLE at least antigravity strength but weaker than left. He reports impaired sensation of RLE and that bilat bottoms of feet chronically numb. Did not follow commands for coordination testing and gait not tested.  ASSESSMENT/PLAN [name] is a 55 y.o. male with a questionable PMHx of CHF and DM II per bystanders who presents to [facility] ED via EMS after chest pain complaint followed by unresponsiveness followed by unresponsiveness and vomiting and EMS noticed right-sided movements but left gaze deviation and left hemiparesis. CT scans showed subacute left cerebellar infarct with possible dense basilar artery  sign but CT angiogram was of poor quality due to timing of dye injection hence patient was taken for emergent cerebral catheter angiogram and was intubated for airway protection. IV tPA considered but not given because diagnosis of stroke was not certain given lack of significant focal findings except gaze deviation and CT scan showing a subacute cerebellar infarct patient unable to give clear history. No LVO on diagnostic angiogram. MRI shows acute  ischemic nonhemorrhagic right superior cerebellar artery.   Stroke: Most recently, neurology was called 1/26 for Code Stroke d/t sudden onset of decreased responsiveness and dysarthria with paucity of speech.  MRI brain from yesterday (1/26) showing evidence of acute infarct involving the right superior cerebellar peduncle and right middle cerebellar peduncle and edema in the right cerebellar hemisphere from subacute infarct. Upon admission patient was found to have a right SCA territory infarct. Strokes likely due to cocaine cardiomyopathy with low EF vs. atherosclerosis on 1/17  Stroke:  right SCA territory infarct, likely due to cocaine cardiomyopathy with low EF vs. atherosclerosis   Code Stroke CT head; No intracranial hemorrhage  CTA head & neck: Non opacification of the left vertebral artery with retrograde filling of the distal left V4 segment.  MRI:  Acute ischemic right SCA infarct   CT head 1/26 no change, subacute right and chronic left cerebellar infarcts  MRI repeat 1/26 acute infarct involving the right superior cerebellar peduncle and right middle cerebellar peduncle and edema in the right cerebellar hemisphere from subacute infarct.   2D Echo: EF <20%. The left ventricle has severely decreased function.   LDL : 72  HgbA1c: 6.4  VTE prophylaxis - SCDs  Unavailable  prior to admission, now on aspirin 81 mg daily and plavix 75 DAPT for 3 weeks and then ASA alone.    Therapy recommendations:SNF  Disposition: Pending  ? Seizure   EEG 1/17 - severe diffuse encephalopathy, nonspecific etiology  EEG 1/19 - evidence of epileptogenicity arising from right>left frontal region.  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 Suicidal Ideation    voiced to me on rounds yesterday evening.   Consider psychiatry consult.  Has sitter at bedside  Management per primary team  Hypertension  Home meds: Coreg, clonidine, hydralazine, lisinopril  Stable  Long-term BP goal normotensive  Hyperlipidemia  Home meds: Lipitor 40 mg  On Lipitor 40 mg  LDL: 72  Continue statin at discharge  Diabetes type II   Home meds: None  HgbA1c: 6.4  CBGs  SSI  Close PCP follow up  Cocaine abuse  UDS + cocaine  Cessation counseling provided  Pt is willing to quit  Other Stroke Risk Factors  Systolic and diastolic congestive heart failure  Other Active Problems    Neurology will sign off. Please call with questions. Pt will follow up with stroke clinic NP at [facility] in about 4 weeks. Thanks for the consult.  [facility], MD PhD Stroke Neurology 12/27/2020 5:11 PM  To contact Stroke Continuity provider, please refer to [email or web address] After hours, contact General Neurology

## 2020-12-27 NOTE — Progress Notes (Signed)
Physical Therapy Treatment Patient Details Name: [name] MRN: [id number] DOB: 1966-01-17 Today's Date: 12/27/2020    History of Present Illness Homeless 55 yo male found down with c/o CP by bystanders. 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 Extubated 1/20.    PT Comments    Pt with improved status this date as he did not have another bout of decreased mental and functional status as he did yesterday. Pt making progress from needing to utilize a stedy for transfers to using a RW this date with modAx2 to stand step to the R. His strong R lateral lean and ataxic movements on his R along with his cognitive deficits impact his safety and place him at high risk for falls. He requires max cues to keep hands in contact with surfaces for closed-chain movements to decrease the ataxia, noted success.  Also, he needed assistance to coordinate his R foot to advance and place it with stepping several times to the R. Pt did report dizziness/lightheadedness with standing and would benefit from checking his BP in different positions to ensure no significant change. Will continue to follow acutely. Current recommendations remain appropriate.  Follow Up Recommendations  SNF;Supervision/Assistance - 24 hour     Equipment Recommendations  Other (comment) (TBA)    Recommendations for Other Services       Precautions / Restrictions Precautions Precautions: Fall Precaution Comments: has been agressive at times, can quickly become agitated Restrictions Weight Bearing Restrictions: No    Mobility  Bed Mobility Overal bed mobility: Needs Assistance Bed Mobility: Supine to Sit     Supine to sit: HOB elevated;Min assist     General bed mobility comments: Came to sit R EOB with extra time and minA for pt to pull  with L hand on therapist to ascend trunk.  Transfers Overall transfer level: Needs assistance Equipment used: Rolling walker (2 wheeled) Transfers: Sit to/from [employer] Sit to Stand: +2 physical assistance;Mod assist;+2 safety/equipment;Min assist Stand pivot transfers: Mod assist;+2 physical assistance;+2 safety/equipment       General transfer comment: Sit to stand from EOB to RW, cuing pt to maintain closed-chain movements throughout by having pt keep R hand on RW and push up with L hand from bed then immediately place L on RW once standing, noted success. ModAx1 with min-modAx1 to come to stand 2x and then modAx2 to weight shift pt, manage RW, and advance legs while blocking R lateral lean to stand step to R EOB > recliner.  Ambulation/Gait Ambulation/Gait assistance: Mod assist;+2 physical assistance;+2 safety/equipment Gait Distance (Feet): 2 Feet Assistive device: Rolling walker (2 wheeled) Gait Pattern/deviations: Step-through pattern;Decreased weight shift to left;Decreased step length - right;Decreased step length - left;Decreased stride length;Ataxic;Trunk flexed Gait velocity: reduced Gait velocity interpretation: <1.31 ft/sec, indicative of household ambulator General Gait Details: Ambulates with ataxic movements, requiring max cues and modAx2 to shift weight and place each foot, esp the R. Tends to lean to R and requires body block to prevent LOB. Hands on RW at all times.   Stairs             Wheelchair Mobility    Modified Rankin (Stroke Patients Only) Modified Rankin (Stroke Patients Only) Pre-Morbid Rankin Score: No symptoms Modified Rankin: Moderately severe disability     Balance Overall balance assessment: Needs assistance Sitting-balance support: Feet supported;Bilateral  upper extremity supported Sitting balance-Leahy Scale: Poor Sitting balance - Comments: Ataxic truncal movements when pt removes UE support thus cued to keep bil  UEs on bed or rails with improved balance and min guard.   Standing balance support: Bilateral upper extremity supported Standing balance-Leahy Scale: Poor Standing balance comment: reliant on BUE and external support                            Cognition Arousal/Alertness: Awake/alert Behavior During Therapy: Impulsive;Restless;Agitated Overall Cognitive Status: Impaired/Different from baseline Area of Impairment: Attention;Following commands;Safety/judgement;Awareness;Problem solving;Memory                   Current Attention Level: Sustained Memory: Decreased short-term memory Following Commands: Follows one step commands with increased time;Follows one step commands consistently Safety/Judgement: Decreased awareness of safety;Decreased awareness of deficits Awareness: Emergent Problem Solving: Slow processing;Decreased initiation;Requires verbal cues;Difficulty sequencing;Requires tactile cues General Comments: Pt demonstrates ataxic movements, requiring repeated cues to keep hands in contact with surfaces at all times. Pt with poor awareness of his deficits. He was agitated and resistant intermittently during session.      Exercises      General Comments General comments (skin integrity, edema, etc.): HR 80-90s, SpO2 >/= 98%, BP 115/91 once sitting in recliner; pt reported dizziness with standing      Pertinent Vitals/Pain Pain Assessment: Faces Faces Pain Scale: No hurt Pain Intervention(s): Monitored during session    Home Living                      Prior Function            PT Goals (current goals can now be found in the care plan section) Acute Rehab PT Goals Patient Stated Goal: to improve PT Goal Formulation: With patient Time For Goal Achievement: 01/03/21 Potential to Achieve Goals: Good Progress towards PT goals: Progressing toward goals    Frequency    Min 3X/week      PT Plan Current plan remains appropriate     Co-evaluation              AM-PAC PT "6 Clicks" Mobility   Outcome Measure  Help needed turning from your back to your side while in a flat bed without using bedrails?: A Little Help needed moving from lying on your back to sitting on the side of a flat bed without using bedrails?: A Little Help needed moving to and from a bed to a chair (including a wheelchair)?: A Lot Help needed standing up from a chair using your arms (e.g., wheelchair or bedside chair)?: A Lot Help needed to walk in hospital room?: A Lot Help needed climbing 3-5 steps with a railing? : A Lot 6 Click Score: 14    End of Session Equipment Utilized During Treatment: Gait belt Activity Tolerance: Patient tolerated treatment well Patient left: with call bell/phone within reach;in chair;with nursing/sitter in room (sitter reported no need for chair alarm) Nurse Communication: Mobility status PT Visit Diagnosis: Unsteadiness on feet (R26.81);Other symptoms and signs involving the nervous system (R29.898);Other abnormalities of gait and mobility (R26.89);Ataxic gait (R26.0);Difficulty in walking, not elsewhere classified (R26.2)     Time: 4098-1191 PT Time Calculation (min) (ACUTE ONLY): 19 min  Charges:  $Therapeutic Activity: 8-22 mins                     Raymond Gurney, PT, DPT Acute Rehabilitation Services  Pager:  [phone] Office: [phone]    Henrene Dodge Pettis 12/27/2020, 11:08 AM

## 2020-12-27 NOTE — Progress Notes (Signed)
Nutrition Follow-up  DOCUMENTATION CODES:   Not applicable  INTERVENTION:   Continue Ensure Enlive po BID, each supplement provides 350 kcal and 20 grams of protein  Continue 71ml Prosource Plus po BID, each supplement provides 100 kcals and 15 grams of protein  Continue MVI with minerals daily   NUTRITION DIAGNOSIS:   Inadequate oral intake related to inability to eat as evidenced by NPO status.  Progressing, pt now on regular diet with thin liquids  GOAL:   Patient will meet greater than or equal to 90% of their needs  progressing  MONITOR:   PO intake,Supplement acceptance,Weight trends,I & O's  REASON FOR ASSESSMENT:   Consult,Ventilator Enteral/tube feeding initiation and management  ASSESSMENT:   55 year old male admitted with acute ischemic nonhemorrhagic right superior cerebellar artery territory infarct. PMH of polysubstance abuse, CHF.  1/20 - extubated, diet advanced to regular with thin liquids  1/21 transferred to floor  Pt was placed on 1-to-1 sitter yesterday due to admitting to having SI.   Pt denies N/V. Appetite has been fair.  PO intake: 50-80% meal completion x last 8 recorded meals (70% average meal intake). Pt with orders for Prosource Plus po BID and Ensure po BID. Per RN, pt consuming both supplements well. Recommend continue with current nutrition plan of care.   No UOP documented.   Admit wt: 95.1 kg Current wt: 85.1 kg  Per RN assessment, pt continues to have non-pitting edema to BLE.   Labs: PO4 4.7 (H) Medications: lasix, mag-ox, mvi with minerals, miralax, aldactone  NUTRITION - FOCUSED PHYSICAL EXAM:  Unable to complete at this time as RD is working remotely.   Diet Order:   Diet Order            Diet regular Room service appropriate? Yes; Fluid consistency: Thin  Diet effective now                 EDUCATION NEEDS:   No education needs have been identified at this time  Skin:  Skin Assessment: Reviewed RN  Assessment (R/L great toe wounds)  Last BM:  1/22  Height:   Ht Readings from Last 1 Encounters:  12/21/20 5\' 9"  (1.753 m)    Weight:   Wt Readings from Last 1 Encounters:  12/23/20 85.1 kg    BMI:  Body mass index is 27.71 kg/m.  Estimated Nutritional Needs:   Kcal:  2200-2400  Protein:  120-140 grams  Fluid:  >2 L/day    12/25/20, MS, RD, LDN RD pager number and weekend/on-call pager number located in Amion.

## 2020-12-27 NOTE — Progress Notes (Signed)
Patient Update:  At around 2250 12/26/2020 Patient refused nighttime medications and did not want VS or CBGs checked once again. Witnessed by Comptroller.

## 2020-12-27 NOTE — Progress Notes (Signed)
PROGRESS NOTE    [name]  NFA:[id number] DOB: [birth year] DOA: 12/17/2020 PCP: [name]   Brief Narrative:  The patient is a 55 year old gentleman, initially with unknown identity brought to the ER after he complained of chest pain and became unresponsive with left-sided weakness and left gaze deviation. Was intubated in the ER. Apparently seen by bystanders when patient complained of chest pain and all of a sudden he became unresponsive. 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 1/17-1/20, admitted to ICU, extubated to 4 L oxygen on 1/20. 1/21, transferred to [facility] service, progressive bed. Patient has different MRN in our hospital system, he has frequent hospitalization and was recently admitted and discharged a week ago with diabetic toe ulcer. Review his previous records indicate that he is homeless, he is noncompliant, uses cocaine. He has a history of COPD, bipolar 1 disorder, known chronic heart failure with recent ejection fraction 40%, ischemic heart disease and previously declined PCI.  Significant diagnostics: 1/17 CT head: Left cerebellar hypodensity concerning for acute/subacute infarct. No intracranial hemorrhage. Small right parietal scalp hematoma 1/17 CTA head and neck: Non opacification of the left vertebral artery with retrograde filling of the distal left V4 segment. Bilateral carotid bifurcation atheromatous disease with 50% right and 30% left carotid bulb narrowing. Mild left P2 segment narrowing. 2D echo 1/18>>>EF <20%, hypokinesis, mod LVH, grade 2 diastolic dysfunction,  EEG 1/19>>This studyshowed evidence of epileptogenicity arising fromright>leftfrontal region. There is also moderatediffuse encephalopathy, nonspecific etiology butcould be secondary tosedation.No seizures were seen throughout the  recording.  Because the patient continued to have worsening neurological symptoms today became unresponsive a stat head CT was obtained again and he underwent EEG monitoring.  Code stroke was called today by the nursing.  EEG was within normal limits.  Patient did admit to having some suicidal ideation so he was placed on a one-to-one sitter and psychiatry is consulted for further evaluation.  Psych evaluated and deemed him not to be a risk and did not feel as if he was a risk to himself or others. Neurology evaluated and recommended ASA 81 mg Daily and Plavix 75 mg for 3 weeks and then ASA alone. Repeat MRI showed Acute Infarct involving the Right Superior Cerebellar and Right Middle Cerebellar Peduncle. 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 They recommend Keppra 500 mg po BID"  -MRA of the head, acute occlusion of the right cerebellar artery  -LDL 72, start Lipitor 40.  Liver function tests normalized. -A1c 6.4 -EEG with diffuse encephalopathy -Patient was treated with heparin.  Now on aspirin and Plavix for 3 weeks then aspirin alone. 1/25, patient complained of persistent right-sided weakness that he claims worsening,  -He had a repeat head CT yesterday and again had some neurological deficits today still repeat head code stroke was called today. -Underwent a repeat MRI and EEG done and showed "This study is within normal limits. No seizures or epileptiform discharges were seen throughout the recording." -Neurology recommending DAPT for 3 weeks  then ASA alone  Acute  on chronic systolic and diastolic congestive heart failure: Recent echocardiogram with ejection fraction 40% New echocardiogram ejection fraction less than 20%. On aspirin, Lipitor, Entresto, aldactone, coreg and Imdur and oral lasix.  Kidney functions are stable. Patient is noncompliant and will be difficult to treat. -Strict I's and O's; He is -7.942 Liters  -Continue to Monitor closely  Questionable seizure activity:  -No more seizure activities.  Neurology recommended no AEDs at this time but then he became altered again this afternoon so neurology repeated a stat EEG and placed him on low-dose Keppra 500 twice daily. -He has impulsiveness and some tremors, unlikely seizure.  Acute hypoxemic respiratory failure:  -Secondary to stroke.  Currently on room air.  Acute metabolic toxic encephalopathy:  -Multifactorial.  Stroke, drug use, acute respiratory failure and sedation.  Stabilizing. Patient has baseline behavior problems and intermittent agitations, aggressiveness towards the staff.  He endorsed some psychotic symptoms.  Seen by psychiatry and cleared for any lethality. -Will need placement to skilled nursing facility.  Acute liver failure: -Improved.  Probably due to ischemic liver. -AST is 23 and ALT is 42 -Continue to Monitor and Trend   Cocaine use: Counseled to quit.  Bipolar disorder/homelessness: Poor compliance. Homelessness.  Consulted psychiatry.    Admits to having some suicidal ideation so we will place him on a one-to-one sitter but Psych evaluated and cleared him   Deconditioning  Continue mobilizing.  Referred to SNF. Patient is throwing away his telemetry and taking off his IV lines, discontinue telemetry.  Transfer to MedSurg bed. Transfer to skilled nursing facility if available per social work placement will be extremely difficult.  Bilateral leg ulcers: Probably ischemic leg ulcers.  Local wound care.  With acute a stroke, behavioral problems, not  a surgical candidate at this time.  DVT prophylaxis: SCDs Code Status: FULL CODE Family Communication: No family present at bedside Disposition Plan: SNF when able  Status is: Inpatient  Remains inpatient appropriate because:Unsafe d/c plan, IV treatments appropriate due to intensity of illness or inability to take PO and Inpatient level of care appropriate due to severity of illness   Dispo: The patient is from: Home              Anticipated d/c is to: SNF              Anticipated d/c date is: 3 days              Patient currently is not medically stable to d/c.   Difficult to place patient Yes  Consultants:   Neurology  Theology   Procedures: See above  Repeat EEG 12/26/2020: This study is within normal limits. No seizures or epileptiform discharges were seen throughout the recording.  Antimicrobials:  Anti-infectives (From admission, onward)   Start     Dose/Rate Route Frequency Ordered Stop   12/17/20 1450  ceFAZolin (ANCEF) 2-4 GM/100ML-% IVPB       Note to Pharmacy: Cristy Friedlander   : cabinet override      12/17/20 1450 12/17/20 1911        Subjective: Seen and examined at bedside he was laying in the chair and denied any nausea or vomiting.  Waiting for psychiatrist to come by but then denied suicidal ideation to them.  Neurology signed off the case now.  He became agitated this afternoon and became belligerent and refused care.  No other concerns or complaints at this time.  Objective: Vitals:   12/26/20  1709 12/27/20 0829 12/27/20 1410 12/27/20 1637  BP: 114/65 112/72 90/65 (!) 89/61  Pulse: 82 89 82 89  Resp: 20 20  18   Temp: 97.9 F (36.6 C) 97.8 F (36.6 C) 97.8 F (36.6 C) 97.8 F (36.6 C)  TempSrc: Oral Oral Oral Oral  SpO2: 100% 94% 100% 100%  Weight:      Height:        Intake/Output Summary (Last 24 hours) at 12/27/2020 1709 Last data filed at 12/27/2020 1410 Gross per 24 hour  Intake 680 ml  Output 900 ml  Net -220 ml   Filed Weights    12/21/20 0425 12/22/20 0308 12/23/20 0500  Weight: 94.5 kg 89.2 kg 85.1 kg   Examination: Physical Exam:  Constitutional: WN/WD overweight African-American male who is in the chair and appears uncomfortable and remains slightly disheveled  Eyes: Lids and conjunctivae normal, sclerae anicteric  ENMT: External Ears, Nose appear normal. Grossly normal hearing. .  Neck: Appears normal, supple, no cervical masses, normal ROM, no appreciable thyromegaly Respiratory: Diminished to auscultation bilaterally, no wheezing, rales, rhonchi or crackles. Normal respiratory effort and patient is not tachypenic. No accessory muscle use. Unlabored breathing  Cardiovascular: RRR, no murmurs / rubs / gallops. S1 and S2 auscultated. Minimal Extremity edmea Abdomen: Soft, non-tender, Distended 2/2 to Body habitus. N Bowel sounds positive.  GU: Deferred. Musculoskeletal: No clubbing / cyanosis of digits/nails. No joint deformity upper and lower extremities.  Skin: No rashes, lesions, ulcers. No induration; Warm and dry.  Neurologic: CN2-12 grossly intact but RUE is weak.Romberg sign cerebellar reflexes not assessed.  Psychiatric: Normal judgment and insight. Alert and oriented x 3. Slightly agitated mood and appropriate affect.   Data Reviewed: I have personally reviewed following labs and imaging studies  CBC: Recent Labs  Lab 12/21/20 0100 12/22/20 0108 12/26/20 0848 12/27/20 0304  WBC 8.9 6.9 5.3 6.0  NEUTROABS  --   --  2.4 2.2  HGB 12.5* 12.7* 14.1 15.1  HCT 36.9* 37.8* 43.6 46.5  MCV 98.1 96.2 95.4 95.5  PLT 291 289 374 394   Basic Metabolic Panel: Recent Labs  Lab 12/23/20 0109 12/24/20 0433 12/26/20 0848 12/27/20 0304  NA 136 137 134* 136  K 3.6 4.0 4.0 4.5  CL 100 102 98 103  CO2 26 25 25 22   GLUCOSE 103* 99 161* 113*  BUN 9 9 19  21*  CREATININE 0.94 0.99 1.01 1.10  CALCIUM 8.6* 8.8* 8.7* 9.2  MG 1.9 1.8 2.0 2.0  PHOS 4.3  --  4.3 4.7*   GFR: Estimated Creatinine Clearance:  85 mL/min (by C-G formula based on SCr of 1.1 mg/dL). Liver Function Tests: Recent Labs  Lab 12/21/20 0829 12/26/20 0848 12/27/20 0304  AST 55* 25 23  ALT 147* 46* 42  ALKPHOS 91 75 77  BILITOT 0.7 0.7 0.2*  PROT 6.3* 7.1 7.5  ALBUMIN 2.3* 2.6* 2.8*   No results for input(s): LIPASE, AMYLASE in the last 168 hours. No results for input(s): AMMONIA in the last 168 hours. Coagulation Profile: No results for input(s): INR, PROTIME in the last 168 hours. Cardiac Enzymes: No results for input(s): CKTOTAL, CKMB, CKMBINDEX, TROPONINI in the last 168 hours. BNP (last 3 results) No results for input(s): PROBNP in the last 8760 hours. HbA1C: No results for input(s): HGBA1C in the last 72 hours. CBG: Recent Labs  Lab 12/26/20 1116 12/26/20 1335 12/26/20 1835 12/27/20 0806 12/27/20 1632  GLUCAP 103* 148* 143* 107* 185*   Lipid Profile: No results  for input(s): CHOL, HDL, LDLCALC, TRIG, CHOLHDL, LDLDIRECT in the last 72 hours. Thyroid Function Tests: No results for input(s): TSH, T4TOTAL, FREET4, T3FREE, THYROIDAB in the last 72 hours. Anemia Panel: No results for input(s): VITAMINB12, FOLATE, FERRITIN, TIBC, IRON, RETICCTPCT in the last 72 hours. Sepsis Labs: No results for input(s): PROCALCITON, LATICACIDVEN in the last 168 hours.  Recent Results (from the past 240 hour(s))  MRSA PCR Screening     Status: None   Collection Time: 12/17/20  6:02 PM   Specimen: Nasal Mucosa; Nasopharyngeal  Result Value Ref Range Status   MRSA by PCR NEGATIVE NEGATIVE Final    Comment:        The GeneXpert MRSA Assay (FDA approved for NASAL specimens only), is one component of a comprehensive MRSA colonization surveillance program. It is not intended to diagnose MRSA infection nor to guide or monitor treatment for MRSA infections. Performed at [facility], [address] [place], [name] [address]     RN Pressure Injury Documentation:     Estimated body mass index is 27.71  kg/m as calculated from the following:   Height as of this encounter: 5\' 9"  (1.753 m).   Weight as of this encounter: 85.1 kg.  Malnutrition Type:  Nutrition Problem: Inadequate oral intake Etiology: inability to eat  Malnutrition Characteristics:  Signs/Symptoms: NPO status   Nutrition Interventions:  Interventions: Prostat,MVI,Ensure Enlive (each supplement provides 350kcal and 20 grams of protein)   Radiology Studies: MR BRAIN WO CONTRAST  Result Date: 12/26/2020 CLINICAL DATA:  Neuro deficit, acute, stroke suspected. EXAM: MRI HEAD WITHOUT CONTRAST TECHNIQUE: Multiplanar, multiecho pulse sequences of the brain and surrounding structures were obtained without intravenous contrast. COMPARISON:  MRI of the brain performed on December 17, 2020. FINDINGS: The study is degraded by motion. Brain: Area of restricted diffusion involving the right superior cerebellar peduncle and right middle cerebellar peduncle, consistent with a right SCA territory infarct. Edema in the right cerebellar hemisphere is also noted from subacute infarct seen on prior MRI. Scattered and confluent foci of T2 hyperintensity are seen within the white matter of the cerebral hemispheres, nonspecific. No hemorrhage, hydrocephalus, mass or midline shift. Vascular: Abnormal flow void in the V4 segment of the left vertebral artery may represent slow flow versus occlusion. Skull and upper cervical spine: Normal marrow signal. Sinuses/Orbits: Negative. IMPRESSION: 1. Acute infarct involving the right superior cerebellar peduncle and right middle cerebellar peduncle. 2. Edema in the right cerebellar hemisphere from subacute infarct seen on prior MRI. 3. Mild to moderate chronic white matter disease. Electronically Signed   By: December 19, 2020 M.D.   On: 12/26/2020 15:06   EEG adult  Result Date: 12/26/2020 12/28/2020, MD     12/26/2020  5:46 PM Patient Name: [name] MRN: [name] Epilepsy Attending:  [id number] Referring Physician/Provider: Dr Charlsie Quest Date: 12/26/2020 Duration: 23.42 mins Patient history: 54yo M with ams. EEG to evaluate for seizure Level of alertness: Awake, drowsy, sleep, comatose, lethargic AEDs during EEG study: VPA, LEV Technical aspects: This EEG study was done with scalp electrodes positioned according to the 10-20 International system of electrode placement. Electrical activity was acquired at a sampling rate of 500Hz  and reviewed with a high frequency filter of 70Hz  and a low frequency filter of 1Hz . EEG data were recorded continuously and digitally stored. Description: The posterior dominant rhythm consists of 8 Hz activity of moderate voltage (25-35 uV) seen predominantly in posterior head regions, symmetric and reactive to  eye opening and eye closing. Hyperventilation and photic stimulation were not performed.   IMPRESSION: This study is within normal limits. No seizures or epileptiform discharges were seen throughout the recording. Charlsie Quest   CT HEAD CODE STROKE WO CONTRAST  Result Date: 12/26/2020 CLINICAL DATA:  Code stroke.  New onset confusion. EXAM: CT HEAD WITHOUT CONTRAST TECHNIQUE: Contiguous axial images were obtained from the base of the skull through the vertex without intravenous contrast. COMPARISON:  Head CT 12/25/2020 and MRI 12/17/2020 FINDINGS: Brain: A subacute infarct in the superior right cerebellum is unchanged, and a chronic infarct is again noted inferiorly in the left cerebellum. No new infarct, intracranial hemorrhage, mass, midline shift, or extra-axial fluid collection is identified. Hypodensities in the cerebral white matter bilaterally are unchanged and nonspecific but compatible with mild-to-moderate chronic small vessel ischemic disease. The ventricles and sulci are within normal limits for age. Vascular: Calcified atherosclerosis at the skull base. No suspicious vessel hyperdensity. Skull: No fracture or suspicious osseous lesion.  Sinuses/Orbits: Visualized paranasal sinuses and mastoid air cells are clear. Remote medial left orbital fracture. Other: None. ASPECTS [facility] Stroke Program Early CT Score) Not scored with this history. IMPRESSION: 1. No evidence of acute intracranial abnormality. 2. 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 3. Mild-to-moderate chronic small vessel ischemic disease. These results were communicated to Dr. Roda Shutters at 2:16 pm on 12/26/2020 by text page via the [facility] messaging system. Electronically Signed   By: Sebastian Ache M.D.   On: 12/26/2020 14:18   Scheduled Meds: . (feeding supplement) PROSource Plus  30 mL Oral BID BM  . aspirin  81 mg Oral Daily  . atorvastatin  40 mg Oral Daily  . carvedilol  6.25 mg Oral BID WC  . Chlorhexidine Gluconate Cloth  6 each Topical Daily  . clopidogrel  75 mg Oral Daily  . divalproex  500 mg Oral Daily  . feeding supplement  237 mL Oral BID BM  . furosemide  40 mg Oral Daily  . hydrALAZINE  25 mg Oral TID  . isosorbide mononitrate  30 mg Oral Daily  . levETIRAcetam  500 mg Oral BID  . magnesium oxide  400 mg Oral BID  . multivitamin with minerals  1 tablet Oral Daily  . polyethylene glycol  17 g Per Tube Daily  . sacubitril-valsartan  1 tablet Oral BID  . silver sulfADIAZINE   Topical BID  . spironolactone  12.5 mg Oral Daily   Continuous Infusions:   LOS: 10 days   Merlene Laughter, DO [facility] PAGER is on AMION  If 7PM-7AM, please contact night-coverage [email or web address]

## 2020-12-27 NOTE — Plan of Care (Signed)
  Problem: Education: Goal: Knowledge of General Education information will improve Description: Including pain rating scale, medication(s)/side effects and non-pharmacologic comfort measures Outcome: Progressing   Problem: Clinical Measurements: Goal: Ability to maintain clinical measurements within normal limits will improve Outcome: Progressing Goal: Will remain free from infection Outcome: Progressing Goal: Diagnostic test results will improve Outcome: Progressing Goal: Respiratory complications will improve Outcome: Progressing Goal: Cardiovascular complication will be avoided Outcome: Progressing   Problem: Ischemic Stroke/TIA Tissue Perfusion: Goal: Complications of ischemic stroke/TIA will be minimized Outcome: Progressing   

## 2020-12-28 ENCOUNTER — Other Ambulatory Visit: Payer: Self-pay

## 2020-12-28 ENCOUNTER — Encounter (HOSPITAL_COMMUNITY): Payer: Self-pay | Admitting: Neurology

## 2020-12-28 DIAGNOSIS — I5043 Acute on chronic combined systolic (congestive) and diastolic (congestive) heart failure: Secondary | ICD-10-CM | POA: Diagnosis not present

## 2020-12-28 DIAGNOSIS — Z515 Encounter for palliative care: Secondary | ICD-10-CM

## 2020-12-28 DIAGNOSIS — Z7189 Other specified counseling: Secondary | ICD-10-CM | POA: Diagnosis not present

## 2020-12-28 DIAGNOSIS — R45851 Suicidal ideations: Secondary | ICD-10-CM | POA: Diagnosis not present

## 2020-12-28 LAB — CBC WITH DIFFERENTIAL/PLATELET
Abs Immature Granulocytes: 0.02 10*3/uL (ref 0.00–0.07)
Basophils Absolute: 0.1 10*3/uL (ref 0.0–0.1)
Basophils Relative: 1 %
Eosinophils Absolute: 0.3 10*3/uL (ref 0.0–0.5)
Eosinophils Relative: 4 %
HCT: 44.9 % (ref 39.0–52.0)
Hemoglobin: 15.1 g/dL (ref 13.0–17.0)
Immature Granulocytes: 0 %
Lymphocytes Relative: 39 %
Lymphs Abs: 2.4 10*3/uL (ref 0.7–4.0)
MCH: 31.7 pg (ref 26.0–34.0)
MCHC: 33.6 g/dL (ref 30.0–36.0)
MCV: 94.1 fL (ref 80.0–100.0)
Monocytes Absolute: 1.1 10*3/uL — ABNORMAL HIGH (ref 0.1–1.0)
Monocytes Relative: 18 %
Neutro Abs: 2.4 10*3/uL (ref 1.7–7.7)
Neutrophils Relative %: 38 %
Platelets: 414 10*3/uL — ABNORMAL HIGH (ref 150–400)
RBC: 4.77 MIL/uL (ref 4.22–5.81)
RDW: 14.6 % (ref 11.5–15.5)
WBC: 6.2 10*3/uL (ref 4.0–10.5)
nRBC: 0 % (ref 0.0–0.2)

## 2020-12-28 LAB — CBC
HCT: 46.5 % (ref 39.0–52.0)
Hemoglobin: 15 g/dL (ref 13.0–17.0)
MCH: 30.7 pg (ref 26.0–34.0)
MCHC: 32.3 g/dL (ref 30.0–36.0)
MCV: 95.3 fL (ref 80.0–100.0)
Platelets: 418 10*3/uL — ABNORMAL HIGH (ref 150–400)
RBC: 4.88 MIL/uL (ref 4.22–5.81)
RDW: 14.5 % (ref 11.5–15.5)
WBC: 5.4 10*3/uL (ref 4.0–10.5)
nRBC: 0 % (ref 0.0–0.2)

## 2020-12-28 LAB — COMPREHENSIVE METABOLIC PANEL
ALT: 38 U/L (ref 0–44)
ALT: 36 U/L (ref 0–44)
AST: 29 U/L (ref 15–41)
AST: 27 U/L (ref 15–41)
Albumin: 2.9 g/dL — ABNORMAL LOW (ref 3.5–5.0)
Albumin: 2.8 g/dL — ABNORMAL LOW (ref 3.5–5.0)
Alkaline Phosphatase: 78 U/L (ref 38–126)
Alkaline Phosphatase: 81 U/L (ref 38–126)
Anion gap: 12 (ref 5–15)
Anion gap: 12 (ref 5–15)
BUN: 22 mg/dL — ABNORMAL HIGH (ref 6–20)
BUN: 22 mg/dL — ABNORMAL HIGH (ref 6–20)
CO2: 23 mmol/L (ref 22–32)
CO2: 24 mmol/L (ref 22–32)
Calcium: 9.3 mg/dL (ref 8.9–10.3)
Calcium: 9.1 mg/dL (ref 8.9–10.3)
Chloride: 101 mmol/L (ref 98–111)
Chloride: 102 mmol/L (ref 98–111)
Creatinine, Ser: 1.02 mg/dL (ref 0.61–1.24)
Creatinine, Ser: 1.09 mg/dL (ref 0.61–1.24)
GFR, Estimated: >60 mL/min (ref >60)
GFR, Estimated: >60 mL/min (ref >60)
Glucose, Bld: 111 mg/dL — ABNORMAL HIGH (ref 70–99)
Glucose, Bld: 184 mg/dL — ABNORMAL HIGH (ref 70–99)
Potassium: 4.6 mmol/L (ref 3.5–5.1)
Potassium: 4.3 mmol/L (ref 3.5–5.1)
Sodium: 136 mmol/L (ref 135–145)
Sodium: 138 mmol/L (ref 135–145)
Total Bilirubin: 0.7 mg/dL (ref 0.3–1.2)
Total Bilirubin: 0.5 mg/dL (ref 0.3–1.2)
Total Protein: 7.7 g/dL (ref 6.5–8.1)
Total Protein: 7.4 g/dL (ref 6.5–8.1)

## 2020-12-28 LAB — PHOSPHORUS
Phosphorus: 4.5 mg/dL (ref 2.5–4.6)
Phosphorus: 4.8 mg/dL — ABNORMAL HIGH (ref 2.5–4.6)

## 2020-12-28 LAB — GLUCOSE, CAPILLARY: Glucose-Capillary: 152 mg/dL — ABNORMAL HIGH (ref 70–99)

## 2020-12-28 LAB — MAGNESIUM
Magnesium: 2.3 mg/dL (ref 1.7–2.4)
Magnesium: 2.1 mg/dL (ref 1.7–2.4)

## 2020-12-28 MED ORDER — THIAMINE HCL 100 MG PO TABS
100.0000 mg | ORAL_TABLET | Freq: Every day | ORAL | Status: DC
  Administered 2020-12-28 – 2021-02-02 (×23): 100 mg via ORAL
  Filled 2020-12-28 (×29): qty 1

## 2020-12-28 MED ORDER — ADULT MULTIVITAMIN W/MINERALS CH: 1.0000 | ORAL_TABLET | Freq: Every day | ORAL | Status: DC

## 2020-12-28 MED ORDER — FOLIC ACID 1 MG PO TABS
1.0000 mg | ORAL_TABLET | Freq: Every day | ORAL | Status: DC
  Administered 2020-12-28 – 2021-02-02 (×28): 1 mg via ORAL
  Filled 2020-12-28 (×32): qty 1

## 2020-12-28 MED ORDER — LORAZEPAM 1 MG PO TABS
1.0000 mg | ORAL_TABLET | ORAL | Status: AC | PRN
  Administered 2020-12-28: 1 mg via ORAL
  Filled 2020-12-28: qty 4

## 2020-12-28 MED ORDER — THIAMINE HCL 100 MG/ML IJ SOLN: 100.0000 mg | Freq: Every day | INTRAMUSCULAR | Status: DC

## 2020-12-28 MED ORDER — LORAZEPAM 2 MG/ML IJ SOLN: 1.0000 mg | INTRAMUSCULAR | Status: AC | PRN

## 2020-12-28 NOTE — Progress Notes (Signed)
Patient just now reiterated that we can NOT contact his children and he won't give me names. He did state that his BFF Alek on Emergency Contact info. is who we should contact. Alek works during the day so perhaps evenings might be better.

## 2020-12-28 NOTE — Progress Notes (Signed)
Pt stated he was ready to take his pills and he took them after several attempts. MD made aware

## 2020-12-28 NOTE — Progress Notes (Signed)
Occupational Therapy Treatment Patient Details Name: [name] MRN: [id number] DOB: May 12, 1966 Today's Date: 12/28/2020    History of present illness Homeless 55 yo male found down with c/o CP by bystanders. 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 Extubated 1/20.   OT comments  Pt making gradual progress towards OT goals this session. Session focus on functional mobility as precursor to higher level BADLs. Pt continues to present with impaired balance, decreased safety awareness, impaired motor planning and truncal and BUE ataxia. Pt able to complete x2 stand pivot transfers with MOD - MAX A +2. Pt requires step by step cues to initiate steps limited by BUE/ BLE ataxia. Pt would continue to benefit from skilled occupational therapy while admitted and after d/c to address the below listed limitations in order to improve overall functional mobility and facilitate independence with BADL participation. DC plan remains appropriate, will follow acutely per POC.     Follow Up Recommendations  SNF;Supervision/Assistance - 24 hour    Equipment Recommendations  Other (comment) (TBD at next venue)    Recommendations for Other Services      Precautions / Restrictions Precautions Precautions: Fall Precaution Comments: has been agressive at times, can quickly become agitated, truncal and BUE ataxia Restrictions Weight Bearing Restrictions: No       Mobility Bed Mobility Overal bed mobility: Needs Assistance Bed Mobility: Supine to Sit;Sit to Supine     Supine to sit: HOB elevated;Min assist Sit to supine: Supervision;HOB elevated   General bed mobility comments: light MIN A to elevate trunk when transitioning from supine >sitting. pt returned self to supine with supervision with verbal cues to  initiate  Transfers Overall transfer level: Needs assistance Equipment used: 2 person hand held assist Transfers: Sit to/from [employer] Sit to Stand: +2 physical assistance;Mod assist;+2 safety/equipment;Min assist Stand pivot transfers: Mod assist;+2 physical assistance;+2 safety/equipment;Max assist       General transfer comment: pt sit<>stand from EOB with MIN A +2, MOD A +2 for stand pivot to recliner with pt needing step by step cues to initiate steps, as pt fatigues and becomes increasingly more agitated pt requires up to MAX A +2 to take side steps back to bed d/t impaired motor planning and balance. MOD A +2 to power up from recliner    Balance Overall balance assessment: Needs assistance Sitting-balance support: Feet supported;Bilateral upper extremity supported Sitting balance-Leahy Scale: Poor Sitting balance - Comments: Ataxic truncal movements when pt removes UE support Postural control: Posterior lean;Right lateral lean Standing balance support: Bilateral upper extremity supported Standing balance-Leahy Scale: Poor Standing balance comment: reliant on BUE and external support                           ADL either performed or assessed with clinical judgement   ADL Overall ADL's : Needs assistance/impaired                     Lower Body Dressing: Sit to/from stand;Min guard Lower Body Dressing Details (indicate cue type and reason): pt able to don pants from EOB with min guard for sitting balance when reaching out of BOS to thread pants Toilet Transfer: Moderate assistance;Maximal assistance;+2 for physical assistance;Stand-pivot Toilet Transfer Details (indicate cue type and reason): simulated via functional mobility with x2 stand  pivot transfers from EOB<>recliner, pt requries step by step cues to sequence steps and BUE hand held support. pt limited by impaired balance and impaired motor planning during functional transfers          Functional mobility during ADLs: Moderate assistance;Maximal assistance;+2 for physical assistance General ADL Comments: pt continues to present with impaired balance, decreased safety awareness, impaired motor planning and truncal and BUE ataxia     Vision       Perception     Praxis      Cognition Arousal/Alertness: Awake/alert;Lethargic (moments of lethargy and moments of wakefulness) Behavior During Therapy: Impulsive;Restless;Agitated Overall Cognitive Status: Impaired/Different from baseline Area of Impairment: Attention;Following commands;Safety/judgement;Awareness;Problem solving;Memory                   Current Attention Level: Sustained Memory: Decreased short-term memory Following Commands: Follows one step commands with increased time;Follows one step commands inconsistently Safety/Judgement: Decreased awareness of safety;Decreased awareness of deficits Awareness: Emergent Problem Solving: Slow processing;Decreased initiation;Requires verbal cues;Difficulty sequencing;Requires tactile cues General Comments: Pt demonstrates ataxic movements, requiring repeated cues to keep hands in contact with surfaces at all times. Pt with poor awareness of his deficits.        Exercises     Shoulder Instructions       General Comments Rn present throughout session to observe    Pertinent Vitals/ Pain       Pain Assessment: No/denies pain  Home Living                                          Prior Functioning/Environment              Frequency  Min 2X/week        Progress Toward Goals  OT Goals(current goals can now be found in the care plan section)  Progress towards OT goals: Progressing toward goals  Acute Rehab OT Goals Time For Goal Achievement: 01/03/21 Potential to Achieve Goals: Good  Plan Discharge plan remains appropriate;Frequency remains appropriate    Co-evaluation                 AM-PAC OT "6 Clicks"  Daily Activity     Outcome Measure   Help from another person eating meals?: A Lot Help from another person taking care of personal grooming?: A Lot Help from another person toileting, which includes using toliet, bedpan, or urinal?: A Lot Help from another person bathing (including washing, rinsing, drying)?: A Lot Help from another person to put on and taking off regular upper body clothing?: A Little Help from another person to put on and taking off regular lower body clothing?: A Little 6 Click Score: 14    End of Session Equipment Utilized During Treatment: Gait belt;Rolling walker  OT Visit Diagnosis: Unsteadiness on feet (R26.81);Other abnormalities of gait and mobility (R26.89);Muscle weakness (generalized) (M62.81);Other symptoms and signs involving cognitive function;Hemiplegia and hemiparesis Hemiplegia - Right/Left: Right Hemiplegia - dominant/non-dominant: Dominant Hemiplegia - caused by: Cerebral infarction   Activity Tolerance Patient tolerated treatment well   Patient Left in bed;with call bell/phone within reach;with nursing/sitter in room;Other (comment) (nurse in room as sitter at lunch)   Nurse Communication Mobility status        Time: 5631-4970 OT Time Calculation (min): 17 min  Charges: OT General Charges $OT Visit: 1 Visit OT Treatments $Self Care/Home Management : 8-22 mins  Corrie Dandy  Lorenso Courier., COTA/L Acute Rehabilitation Services [phone]    Barron Schmid 12/28/2020, 3:57 PM

## 2020-12-28 NOTE — Progress Notes (Deleted)
HOSPITAL MEDICINE OVERNIGHT EVENT NOTE    Notified by nursing that patient is refusing his PO Keppra and Hydralazine.  Patient was offered IV Keppra instead but refused this as well.    Patient is awake and alert and voices that he does not want to be disturbed.  He is not currently hypertensive.  Will monitor for seizure activity.   Marinda Elk  MD [facility]

## 2020-12-28 NOTE — Progress Notes (Signed)
Patient update:  Pt allowed staff to give night time medications, vital signs and a CBG. Mood seemed to be more pleasant this time around. Will continue to monitor patient and their behavior.       12/27/20 2117  Vitals  Temp 98 F (36.7 C)  Temp Source Oral  BP 125/78  MAP (mmHg) 93  BP Location Left Arm  BP Method Automatic  Patient Position (if appropriate) Lying  Pulse Rate 99  Pulse Rate Source Dinamap  Resp 16

## 2020-12-28 NOTE — Progress Notes (Addendum)
Palliative Medicine Inpatient Follow Up Note  HPI: 55 y.o. male  with past medical history of CHF with LVEF as low as 15% in the past, polysubstance abuse/cocaine positive this admission, admitted on 12/17/2020.  Patient collapsed at [facility] and EMS called for chest pain and mental status changes.  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  Today's Discussion (12/28/2020):  *Please note that this is a verbal dictation therefore any spelling or grammatical errors are due to the "[employer]" system interpretation.  Chart reviewed. Deryk is working with PT when I entered the room. He is stated "I can't breath". He is notably anxious and has a tremulousness in his BLE. The PT team is able to get him back into his bed.  Per his bedside RN, Sander Nephew he has been able to take all of his medication for her and has been reactively agreeable.  When back in bed Romero is notably agitated - she plans to provide some ativan.   We again discussed code status and patient shares he would wish to be full code.  Wende asks if he has any children - he shares that he has four but does not provide their names.   I asked him if I may come back tomorrow and speak to him more which he seems agreeable towards.  Discussed the importance of continued conversation with family and their  medical providers regarding overall plan of care and treatment options, ensuring decisions are within the context of the patients values and GOCs.  Provided "Hard Choices for Loving People" booklet and  MOST form  Questions and concerns addressed   __________________________________________________________________________________________ Addendum:  I called patients friend Alac. He called me back. Alac met and became very close friends with Garrie a few years ago. Alac then moved out of [place] though he has remained  in close contact with [name]. When Briceson's his health took a turn for the worst one year ago in the setting of his heart failure Alac shares that Berkley has been hospitalized on multiple occassions. I was able to update Alac on Kyson's chronic medical conditions and his poor prognosis in the setting of heart failure. Alac shares that he does not know if Lenwood has any type of relationship with his children. He says that he does have a brother, Demetrius who he is close to and provided me with his phone number. He was thankful for the update.   Objective Assessment: Vital Signs Vitals:   12/28/20 0356 12/28/20 1215  BP: 130/77 (!) 149/121  Pulse: 80 66  Resp: 15 18  Temp: 98.4 F (36.9 C) 98.1 F (36.7 C)  SpO2: 99% 97%    Intake/Output Summary (Last 24 hours) at 12/28/2020 1336 Last data filed at 12/28/2020 1300 Gross per 24 hour  Intake 860 ml  Output 1220 ml  Net -360 ml   Last Weight  Most recent update: 12/28/2020  5:18 AM   Weight  84.8 kg (186 lb 15.2 oz)           Gen:  AA M - tremulous HEENT: moist mucous membranes CV: Regular rate and rhythm  PULM: On RA ABD: soft/nontender  EXT: No edema  Neuro: Alert and oriented    SUMMARY OF RECOMMENDATIONS   Full Code  Provided a MOST for review - would like to fill in prior to discharge  Advance Directives - Patient would benefit from completing  these to designate an HCPOA and complete a living will  Would recommend ativan IV as well as PO as an option for anxiety/withdrawl  Ongoing PMT support  Time Spent: 60 Greater than 50% of the time was spent in counseling and coordination of care ______________________________________________________________________________________ [place] Team Team Cell Phone: [phone] Please utilize secure chat with additional questions, if there is no response within 30 minutes please call the above phone number  Palliative Medicine Team providers  are available by phone from 7am to 7pm daily and can be reached through the team cell phone.  Should this patient require assistance outside of these hours, please call the patient's attending physician.

## 2020-12-28 NOTE — Progress Notes (Signed)
Pt refused all his night time medications, On call was made aware, he attempted to give him IV keppra but still the pt refusing any kind of meds.

## 2020-12-28 NOTE — Progress Notes (Signed)
PROGRESS NOTE    Dirk Vanaman  JKD:[id number] DOB: 01-29-1966 DOA: 12/17/2020 PCP: [name]   Brief Narrative:  The patient is a 55 year old gentleman, initially with unknown identity brought to the ER after he complained of chest pain and became unresponsive with left-sided weakness and left gaze deviation. Was intubated in the ER. Apparently seen by bystanders when patient complained of chest pain and all of a sudden he became unresponsive. 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 1/17-1/20, admitted to ICU, extubated to 4 L oxygen on 1/20. 1/21, transferred to [facility] service, progressive bed. Patient has different MRN in our hospital system, he has frequent hospitalization and was recently admitted and discharged a week ago with diabetic toe ulcer. Review his previous records indicate that he is homeless, he is noncompliant, uses cocaine. He has a history of COPD, bipolar 1 disorder, known chronic heart failure with recent ejection fraction 40%, ischemic heart disease and previously declined PCI.  Significant diagnostics: 1/17 CT head: Left cerebellar hypodensity concerning for acute/subacute infarct. No intracranial hemorrhage. Small right parietal scalp hematoma 1/17 CTA head and neck: Non opacification of the left vertebral artery with retrograde filling of the distal left V4 segment. Bilateral carotid bifurcation atheromatous disease with 50% right and 30% left carotid bulb narrowing. Mild left P2 segment narrowing. 2D echo 1/18>>>EF <20%, hypokinesis, mod LVH, grade 2 diastolic dysfunction,  EEG 1/19>>This studyshowed evidence of epileptogenicity arising fromright>leftfrontal region. There is also moderatediffuse encephalopathy, nonspecific etiology butcould be secondary tosedation.No seizures were seen throughout the  recording.  Because the patient continued to have worsening neurological symptoms today became unresponsive a stat head CT was obtained again and he underwent EEG monitoring.  Code stroke was called today by the nursing.  EEG was within normal limits.  Patient did admit to having some suicidal ideation so he was placed on a one-to-one sitter and psychiatry is consulted for further evaluation.  Psych evaluated and deemed him not to be a risk and did not feel as if he was a risk to himself or others. Neurology evaluated and recommended ASA 81 mg Daily and Plavix 75 mg for 3 weeks and then ASA alone. Repeat MRI showed Acute Infarct involving the Right Superior Cerebellar and Right Middle Cerebellar Peduncle. They recommend Keppra 500 mg po BID   Palliative is consulted and patient does not want any of his children to be informed but wanted to his friend Alac to be notified.Marland Kitchen  He was placed on CIWA Protocol.  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 They recommend Keppra 500 mg po BID"  -MRA of the head, acute occlusion of the right cerebellar artery  -LDL 72, start Lipitor 40.  Liver function tests normalized. -A1c 6.4 -EEG with diffuse encephalopathy -Patient was treated with heparin.  Now on aspirin and Plavix for 3 weeks then aspirin alone. 1/25, patient complained of persistent right-sided weakness that he claims worsening,  -He had a repeat head CT yesterday and again had some neurological deficits today still repeat head code stroke was called  today. -Underwent a repeat MRI and EEG done and showed "This study  is within normal limits. No seizures or epileptiform discharges were seen throughout the recording." -Neurology recommending DAPT for 3 weeks then ASA alone -palliative care consulted for goals of care discussion  Acute on chronic systolic and diastolic congestive heart failure: Recent echocardiogram with ejection fraction 40% New echocardiogram ejection fraction less than 20%. On aspirin, Lipitor, Entresto, aldactone, coreg and Imdur and oral lasix.  Kidney functions are stable. Patient is noncompliant and will be difficult to treat. -Strict I's and O's; He is - 9.017 liters  -Continue to Monitor closely  Questionable seizure activity:  -No more seizure activities.  Neurology recommended no AEDs at this time but then he became altered again this afternoon so neurology repeated a stat EEG and placed him on low-dose Keppra 500 twice daily. -He has impulsiveness and some tremors, unlikely seizure.  Acute hypoxemic respiratory failure:  -Secondary to stroke.  Currently on room air.  Acute metabolic toxic encephalopathy:  -Multifactorial.  Stroke, drug use, acute respiratory failure and sedation.  Stabilizing. Patient has baseline behavior problems and intermittent agitations, aggressiveness towards the staff.  He endorsed some psychotic symptoms.  Seen by psychiatry and cleared for any lethality. -We will place on a CIWA protocol given his history of alcohol abuse continue monitor given that he has been significantly agitated -Will need placement to skilled nursing facility and he is not only able to be placed.  Acute liver failure: -Improved.  Probably due to ischemic liver. -AST is 27 and ALT is 36 -Continue to Monitor and Trend   Cocaine use: Counseled to quit.  Bipolar disorder/homelessness: Poor compliance. Homelessness.  Consulted psychiatry.    Admits to having some suicidal ideation so we will place him on a one-to-one sitter but Psych evaluated and cleared him    Deconditioning  Continue mobilizing.  Referred to SNF. Patient is throwing away his telemetry and taking off his IV lines, discontinue telemetry.  Transfer to MedSurg bed. Transfer to skilled nursing facility if available per social work placement will be extremely difficult.  Bilateral leg ulcers: Probably ischemic leg ulcers.  Local wound care.  With acute a stroke, behavioral problems, not a surgical candidate at this time.  DVT prophylaxis: SCDs Code Status: FULL CODE Family Communication: No family present at bedside Disposition Plan: SNF when able  Status is: Inpatient  Remains inpatient appropriate because:Unsafe d/c plan, IV treatments appropriate due to intensity of illness or inability to take PO and Inpatient level of care appropriate due to severity of illness   Dispo: The patient is from: Home              Anticipated d/c is to: SNF              Anticipated d/c date is: 3 days              Patient currently is not medically stable to d/c.   Difficult to place patient Yes  Consultants:   Neurology  PCCM transfer  Cardiology  Palliative care medicine   Procedures: See above  Repeat EEG 12/26/2020: This study is within normal limits. No seizures or epileptiform discharges were seen throughout the recording.  Antimicrobials:  Anti-infectives (From admission, onward)   Start     Dose/Rate Route Frequency Ordered Stop   12/17/20 1450  ceFAZolin (ANCEF) 2-4 GM/100ML-% IVPB       Note to Pharmacy: Cristy Friedlander   : cabinet override  12/17/20 1450 12/17/20 1911        Subjective: Seen and examined at bedside was withdrawn and stated that he wanted to go home.  I discussed with him that it is not safe for him to go home as he cannot ambulate he understood.  Became agitated earlier with the nurse but then calmed down.  Palliative discussing about goals of care with him and he does not want any of his kids informed about his medical status.  No complaints  and denies any chest pain or shortness of breath.  No other concerns at this time.   Objective: Vitals:   12/27/20 2117 12/28/20 0356 12/28/20 0517 12/28/20 1215  BP: 125/78 130/77  (!) 149/121  Pulse: 99 80  66  Resp: 16 15  18   Temp: 98 F (36.7 C) 98.4 F (36.9 C)  98.1 F (36.7 C)  TempSrc: Oral Oral  Oral  SpO2: 98% 99%  97%  Weight:   84.8 kg   Height:        Intake/Output Summary (Last 24 hours) at 12/28/2020 1755 Last data filed at 12/28/2020 1616 Gross per 24 hour  Intake 200 ml  Output 1275 ml  Net -1075 ml   Filed Weights   12/22/20 0308 12/23/20 0500 12/28/20 0517  Weight: 89.2 kg 85.1 kg 84.8 kg   Examination: Physical Exam:  Constitutional: WN/WD overweight African-American male who is withdrawn but in no acute distress Eyes: Lids and conjunctivae normal, sclerae anicteric  ENMT: External Ears, Nose appear normal. Grossly normal hearing.  Neck: Appears normal, supple, no cervical masses, normal ROM, no appreciable thyromegaly; no JVD Respiratory: Diminished to auscultation bilaterally, no wheezing, rales, rhonchi or crackles. Normal respiratory effort and patient is not tachypenic. No accessory muscle use.  Unlabored breathing Cardiovascular: RRR, no murmurs / rubs / gallops. S1 and S2 auscultated.  Trace extremity edema Abdomen: Soft, non-tender, distended secondary body habitus.  Bowel sounds positive.  GU: Deferred. Musculoskeletal: No clubbing / cyanosis of digits/nails. No joint deformity upper and lower extremities.  Skin: No rashes, lesions, ulcers on limited skin evaluation. No induration; Warm and dry.  Neurologic: CN 2-12 grossly intact motor right upper extremity is weak as well as his right lower extremity.  Romberg sign and cerebellar reflexes not assessed. Psychiatric: Normal judgment and insight. Alert and oriented x 3. Withdrawn mood and appropriate affect.   Data Reviewed: I have personally reviewed following labs and imaging  studies  CBC: Recent Labs  Lab 12/22/20 0108 12/26/20 0848 12/27/20 0304 12/28/20 0534 12/28/20 1437  WBC 6.9 5.3 6.0 6.2 5.4  NEUTROABS  --  2.4 2.2 2.4  --   HGB 12.7* 14.1 15.1 15.1 15.0  HCT 37.8* 43.6 46.5 44.9 46.5  MCV 96.2 95.4 95.5 94.1 95.3  PLT 289 374 394 414* 418*   Basic Metabolic Panel: Recent Labs  Lab 12/23/20 0109 12/24/20 0433 12/26/20 0848 12/27/20 0304 12/28/20 0534 12/28/20 1437  NA 136 137 134* 136 136 138  K 3.6 4.0 4.0 4.5 4.6 4.3  CL 100 102 98 103 101 102  CO2 26 25 25 22 23 24   GLUCOSE 103* 99 161* 113* 111* 184*  BUN 9 9 19  21* 22* 22*  CREATININE 0.94 0.99 1.01 1.10 1.02 1.09  CALCIUM 8.6* 8.8* 8.7* 9.2 9.3 9.1  MG 1.9 1.8 2.0 2.0 2.3 2.1  PHOS 4.3  --  4.3 4.7* 4.5 4.8*   GFR: Estimated Creatinine Clearance: 79.3 mL/min (by C-G formula based on SCr of  1.09 mg/dL). Liver Function Tests: Recent Labs  Lab 12/26/20 0848 12/27/20 0304 12/28/20 0534 12/28/20 1437  AST 25 23 29 27   ALT 46* 42 38 36  ALKPHOS 75 77 78 81  BILITOT 0.7 0.2* 0.7 0.5  PROT 7.1 7.5 7.7 7.4  ALBUMIN 2.6* 2.8* 2.9* 2.8*   No results for input(s): LIPASE, AMYLASE in the last 168 hours. No results for input(s): AMMONIA in the last 168 hours. Coagulation Profile: No results for input(s): INR, PROTIME in the last 168 hours. Cardiac Enzymes: No results for input(s): CKTOTAL, CKMB, CKMBINDEX, TROPONINI in the last 168 hours. BNP (last 3 results) No results for input(s): PROBNP in the last 8760 hours. HbA1C: No results for input(s): HGBA1C in the last 72 hours. CBG: Recent Labs  Lab 12/26/20 1335 12/26/20 1835 12/27/20 0806 12/27/20 1632 12/27/20 2324  GLUCAP 148* 143* 107* 185* 167*   Lipid Profile: No results for input(s): CHOL, HDL, LDLCALC, TRIG, CHOLHDL, LDLDIRECT in the last 72 hours. Thyroid Function Tests: No results for input(s): TSH, T4TOTAL, FREET4, T3FREE, THYROIDAB in the last 72 hours. Anemia Panel: No results for input(s):  VITAMINB12, FOLATE, FERRITIN, TIBC, IRON, RETICCTPCT in the last 72 hours. Sepsis Labs: No results for input(s): PROCALCITON, LATICACIDVEN in the last 168 hours.  No results found for this or any previous visit (from the past 240 hour(s)).  RN Pressure Injury Documentation:     Estimated body mass index is 27.61 kg/m as calculated from the following:   Height as of this encounter: 5\' 9"  (1.753 m).   Weight as of this encounter: 84.8 kg.  Malnutrition Type:  Nutrition Problem: Inadequate oral intake Etiology: inability to eat  Malnutrition Characteristics:  Signs/Symptoms: NPO status   Nutrition Interventions:  Interventions: Prostat,MVI,Ensure Enlive (each supplement provides 350kcal and 20 grams of protein)   Radiology Studies: No results found. Scheduled Meds: . (feeding supplement) PROSource Plus  30 mL Oral BID BM  . aspirin  81 mg Oral Daily  . atorvastatin  40 mg Oral Daily  . carvedilol  6.25 mg Oral BID WC  . Chlorhexidine Gluconate Cloth  6 each Topical Daily  . clopidogrel  75 mg Oral Daily  . divalproex  500 mg Oral Daily  . feeding supplement  237 mL Oral BID BM  . folic acid  1 mg Oral Daily  . furosemide  40 mg Oral Daily  . hydrALAZINE  25 mg Oral TID  . isosorbide mononitrate  30 mg Oral Daily  . levETIRAcetam  500 mg Oral BID  . magnesium oxide  400 mg Oral BID  . multivitamin with minerals  1 tablet Oral Daily  . polyethylene glycol  17 g Per Tube Daily  . sacubitril-valsartan  1 tablet Oral BID  . silver sulfADIAZINE   Topical BID  . spironolactone  12.5 mg Oral Daily  . thiamine  100 mg Oral Daily   Or  . thiamine  100 mg Intravenous Daily   Continuous Infusions:   LOS: 11 days   Merlene Laughter, DO [facility] PAGER is on AMION  If 7PM-7AM, please contact night-coverage [email or web address]

## 2020-12-28 NOTE — Progress Notes (Signed)
Snet note to Dr. Marland Mcalpine:  Patient is extremely agitated, I'm going to give PO ativan. Per conversation between patient, myself and palliative RN he discussed also being a heavy drinker and using marijuana. Can we get a CIWA protocol placed and preferable with IV ativan.

## 2020-12-29 DIAGNOSIS — R45851 Suicidal ideations: Secondary | ICD-10-CM | POA: Diagnosis not present

## 2020-12-29 DIAGNOSIS — Z515 Encounter for palliative care: Secondary | ICD-10-CM | POA: Diagnosis not present

## 2020-12-29 DIAGNOSIS — Z7189 Other specified counseling: Secondary | ICD-10-CM | POA: Diagnosis not present

## 2020-12-29 DIAGNOSIS — I5043 Acute on chronic combined systolic (congestive) and diastolic (congestive) heart failure: Secondary | ICD-10-CM | POA: Diagnosis not present

## 2020-12-29 LAB — CBC WITH DIFFERENTIAL/PLATELET
Abs Immature Granulocytes: 0.01 10*3/uL (ref 0.00–0.07)
Basophils Absolute: 0.1 10*3/uL (ref 0.0–0.1)
Basophils Relative: 1 %
Eosinophils Absolute: 0.3 10*3/uL (ref 0.0–0.5)
Eosinophils Relative: 6 %
HCT: 45.3 % (ref 39.0–52.0)
Hemoglobin: 14.7 g/dL (ref 13.0–17.0)
Immature Granulocytes: 0 %
Lymphocytes Relative: 42 %
Lymphs Abs: 2.2 10*3/uL (ref 0.7–4.0)
MCH: 31.1 pg (ref 26.0–34.0)
MCHC: 32.5 g/dL (ref 30.0–36.0)
MCV: 95.8 fL (ref 80.0–100.0)
Monocytes Absolute: 1 10*3/uL (ref 0.1–1.0)
Monocytes Relative: 19 %
Neutro Abs: 1.7 10*3/uL (ref 1.7–7.7)
Neutrophils Relative %: 32 %
Platelets: 411 10*3/uL — ABNORMAL HIGH (ref 150–400)
RBC: 4.73 MIL/uL (ref 4.22–5.81)
RDW: 14.6 % (ref 11.5–15.5)
WBC: 5.2 10*3/uL (ref 4.0–10.5)
nRBC: 0 % (ref 0.0–0.2)

## 2020-12-29 LAB — PHOSPHORUS: Phosphorus: 4.7 mg/dL — ABNORMAL HIGH (ref 2.5–4.6)

## 2020-12-29 LAB — COMPREHENSIVE METABOLIC PANEL
ALT: 34 U/L (ref 0–44)
AST: 24 U/L (ref 15–41)
Albumin: 2.8 g/dL — ABNORMAL LOW (ref 3.5–5.0)
Alkaline Phosphatase: 74 U/L (ref 38–126)
Anion gap: 9 (ref 5–15)
BUN: 20 mg/dL (ref 6–20)
CO2: 23 mmol/L (ref 22–32)
Calcium: 9.2 mg/dL (ref 8.9–10.3)
Chloride: 102 mmol/L (ref 98–111)
Creatinine, Ser: 1.03 mg/dL (ref 0.61–1.24)
GFR, Estimated: >60 mL/min (ref >60)
Glucose, Bld: 184 mg/dL — ABNORMAL HIGH (ref 70–99)
Potassium: 4.1 mmol/L (ref 3.5–5.1)
Sodium: 134 mmol/L — ABNORMAL LOW (ref 135–145)
Total Bilirubin: 0.9 mg/dL (ref 0.3–1.2)
Total Protein: 7.4 g/dL (ref 6.5–8.1)

## 2020-12-29 LAB — MAGNESIUM: Magnesium: 2 mg/dL (ref 1.7–2.4)

## 2020-12-29 NOTE — Progress Notes (Signed)
PROGRESS NOTE    [name]  OHY:[id number] DOB: 1966/01/09 DOA: 12/17/2020 PCP: [name]   Brief Narrative:  The patient is a 55 year old gentleman, initially with unknown identity brought to the ER after he complained of chest pain and became unresponsive with left-sided weakness and left gaze deviation. Was intubated in the ER. Apparently seen by bystanders when patient complained of chest pain and all of a sudden he became unresponsive. 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 1/17-1/20, admitted to ICU, extubated to 4 L oxygen on 1/20. 1/21, transferred to [facility] service, progressive bed. Patient has different MRN in our hospital system, he has frequent hospitalization and was recently admitted and discharged a week ago with diabetic toe ulcer. Review his previous records indicate that he is homeless, he is noncompliant, uses cocaine. He has a history of COPD, bipolar 1 disorder, known chronic heart failure with recent ejection fraction 40%, ischemic heart disease and previously declined PCI.  Significant diagnostics: 1/17 CT head: Left cerebellar hypodensity concerning for acute/subacute infarct. No intracranial hemorrhage. Small right parietal scalp hematoma 1/17 CTA head and neck: Non opacification of the left vertebral artery with retrograde filling of the distal left V4 segment. Bilateral carotid bifurcation atheromatous disease with 50% right and 30% left carotid bulb narrowing. Mild left P2 segment narrowing. 2D echo 1/18>>>EF <20%, hypokinesis, mod LVH, grade 2 diastolic dysfunction,  EEG 1/19>>This studyshowed evidence of epileptogenicity arising fromright>leftfrontal region. There is also moderatediffuse encephalopathy, nonspecific etiology butcould be secondary tosedation.No seizures were seen throughout the  recording.  Because the patient continued to have worsening neurological symptoms today became unresponsive a stat head CT was obtained again and he underwent EEG monitoring.  Code stroke was called today by the nursing.  EEG was within normal limits.  Patient did admit to having some suicidal ideation so he was placed on a one-to-one sitter and psychiatry is consulted for further evaluation.  Psych evaluated and deemed him not to be a risk and did not feel as if he was a risk to himself or others. Neurology evaluated and recommended ASA 81 mg Daily and Plavix 75 mg for 3 weeks and then ASA alone. Repeat MRI showed Acute Infarct involving the Right Superior Cerebellar and Right Middle Cerebellar Peduncle. They recommend Keppra 500 mg po BID   Palliative is consulted and patient does not want any of his children to be informed but wanted to his friend Alac to be notified.  He was placed on CIWA Protocol.  Continues to be agitated and continues to intermittently refuse his care and refused medications.  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 They recommend Keppra 500 mg po BID"  -MRA of the head, acute occlusion of the right cerebellar artery  -LDL 72, start Lipitor 40.  Liver function tests normalized. -A1c 6.4 -EEG with diffuse encephalopathy -Patient was treated with heparin.  Now on aspirin and Plavix for 3 weeks then aspirin alone. 1/25, patient  complained of persistent right-sided weakness that he claims  worsening,  -He had a repeat head CT yesterday and again had some neurological deficits today still repeat head code stroke was called today. -Underwent a repeat MRI and EEG done and showed "This study is within normal limits. No seizures or epileptiform discharges were seen throughout the recording." -Neurology recommending DAPT for 3 weeks then ASA alone -palliative care consulted for goals of care discussion  Acute on chronic systolic and diastolic congestive heart failure: Recent echocardiogram with ejection fraction 40% New echocardiogram ejection fraction less than 20%. On aspirin, Lipitor, Entresto, aldactone, coreg and Imdur and oral lasix.  Kidney functions are stable. Patient is noncompliant and will be difficult to treat. -Strict I's and O's; He is - 9.402 liters  -Continue to Monitor closely  Questionable seizure activity:  -No more seizure activities.  Neurology recommended no AEDs at this time but then he became altered again this afternoon so neurology repeated a stat EEG and placed him on low-dose Keppra 500 twice daily. -He has impulsiveness and some tremors, unlikely seizure.  Acute hypoxemic respiratory failure:  -Secondary to stroke.  Currently on room air. -SpO2: 98 % O2 Flow Rate (L/min): 4 L/min FiO2 (%): 30 % -Continue monitor respiratory status carefully and if necessary will place back on supplemental oxygen via nasal cannula  Acute metabolic toxic encephalopathy:  -Multifactorial.  Stroke, drug use, acute respiratory failure and sedation.  Stabilizing. Patient has baseline behavior problems and intermittent agitations, aggressiveness towards the staff.  He endorsed some psychotic symptoms.  Seen by psychiatry and cleared for any lethality. -We will place on a CIWA protocol given his history of alcohol abuse continue monitor given that he has been significantly agitated -Will need placement to skilled nursing facility and he is not only able to be  placed.  Acute liver failure: -Improved.  Probably due to ischemic liver. -AST is 24 and ALT is 34 -Continue to Monitor and Trend   Hyperphosphatemia -Mild as Phos Level is 4.7 -Continue to Monitor and Trend -Repeat Phos Level in the AM   Cocaine use:  -Counseled to quit.  Hyponatremia -Mild at 134  -Continue to monitor and trend repeat CMP in a.m.  Bipolar disorder/homelessness: Poor compliance. Homelessness.  Consulted psychiatry.    Admits to having some suicidal ideation so we will place him on a one-to-one sitter but Psych evaluated and cleared him a danger to himself  Deconditioning  -Continue mobilizing.  Referred to SNF. -Patient is throwing away his telemetry and taking off his IV lines, discontinue telemetry.  Transferred to MedSurg bed. -Transfer to skilled nursing facility if available per social work placement will be extremely difficult.  Cannot safely discharge the patient home as he lives by himself  Bilateral feet/leg ulcers: Probably ischemic leg ulcers.  Local wound care.  With acute a stroke, behavioral problems, not a surgical candidate at this time. -Will consider ABI and X-Rays but will defer to Podiatry  -We Consult Podiatry for further evaluation given his complaint of pain today; Notified Answering service for a Non-urgent consult  -Wound nurse  DVT prophylaxis: SCDs Code Status: FULL CODE Family Communication: No family present at bedside Disposition Plan: SNF when able as he is not a safe discharge disposition for home given his behavioral issues and because of his significant weakness  Status is: Inpatient  Remains inpatient appropriate because:Unsafe d/c plan, IV treatments appropriate due to intensity of illness or inability to take PO and Inpatient level of care appropriate  due to severity of illness   Dispo: The patient is from: Home              Anticipated d/c is to: SNF              Anticipated d/c date is: 3 days               Patient currently is not medically stable to d/c.   Difficult to place patient Yes  Consultants:   Neurology  PCCM transfer  Cardiology  Palliative care Medicine  Will discuss with podiatry and ask for a formal Consult   Procedures: See above  Repeat EEG 12/26/2020: This study is within normal limits. No seizures or epileptiform discharges were seen throughout the recording.  Antimicrobials:  Anti-infectives (From admission, onward)   Start     Dose/Rate Route Frequency Ordered Stop   12/17/20 1450  ceFAZolin (ANCEF) 2-4 GM/100ML-% IVPB       Note to Pharmacy: Cristy Friedlander   : cabinet override      12/17/20 1450 12/17/20 1911        Subjective: Seen and examined at bedside again was wanting to go home.  He is complaining of his toe ulcers hurting today.  No nausea or vomiting.  Seemed a little agitated and again refuses medications and care.  Unsafe discharge disposition home as he lives by himself and PT OT recommending at least to SNF level of care.  Patient is unable to ambulate at this time given his weakness from his CVAs.  Objective: Vitals:   12/28/20 0517 12/28/20 1215 12/28/20 2000 12/29/20 0526  BP:  (!) 149/121 124/77   Pulse:  66 81   Resp:  18 20   Temp:  98.1 F (36.7 C) 98 F (36.7 C)   TempSrc:  Oral Oral   SpO2:  97% 98%   Weight: 84.8 kg   80.2 kg  Height:        Intake/Output Summary (Last 24 hours) at 12/29/2020 1625 Last data filed at 12/29/2020 1100 Gross per 24 hour  Intake 340 ml  Output 725 ml  Net -385 ml   Filed Weights   12/23/20 0500 12/28/20 0517 12/29/20 0526  Weight: 85.1 kg 84.8 kg 80.2 kg   Examination: Physical Exam:  Constitutional: WN/WD overweight African-American male who is still very agitated  Eyes: Lids and conjunctivae normal, sclerae anicteric  ENMT: External Ears, Nose appear normal. Grossly normal hearing. Neck: Appears normal, supple, no cervical masses, normal ROM, no appreciable thyromegaly; no  JVD Respiratory: Diminished to auscultation bilaterally, no wheezing, rales, rhonchi or crackles. Normal respiratory effort and patient is not tachypenic. No accessory muscle use.  Unlabored breathing Cardiovascular: RRR, no murmurs / rubs / gallops. S1 and S2 auscultated.  Trace extremity edema Abdomen: Soft, non-tender, slightly distended secondary body habitus.  Bowel sounds positive.  GU: Deferred. Musculoskeletal: No clubbing / cyanosis of digits/nails. No joint deformity upper and lower extremities.  Skin: No rashes, lesions but has bilateral toe ulcers noted and skin changes.  No induration; Warm and dry.  Neurologic: CN 2-12 grossly intact with no focal deficits but is extremely weak in the right upper extremity as well as right lower extremity.  Romberg sign and cerebellar reflexes not assessed.  Psychiatric: Mildly impaired judgment and insight. Alert and oriented x 3.  Agitated mood and appropriate affect.   Data Reviewed: I have personally reviewed following labs and imaging studies  CBC: Recent Labs  Lab 12/26/20 0848 12/27/20  0304 12/28/20 0534 12/28/20 1437 12/29/20 0102  WBC 5.3 6.0 6.2 5.4 5.2  NEUTROABS 2.4 2.2 2.4  --  1.7  HGB 14.1 15.1 15.1 15.0 14.7  HCT 43.6 46.5 44.9 46.5 45.3  MCV 95.4 95.5 94.1 95.3 95.8  PLT 374 394 414* 418* 411*   Basic Metabolic Panel: Recent Labs  Lab 12/26/20 0848 12/27/20 0304 12/28/20 0534 12/28/20 1437 12/29/20 0102  NA 134* 136 136 138 134*  K 4.0 4.5 4.6 4.3 4.1  CL 98 103 101 102 102  CO2 25 22 23 24 23   GLUCOSE 161* 113* 111* 184* 184*  BUN 19 21* 22* 22* 20  CREATININE 1.01 1.10 1.02 1.09 1.03  CALCIUM 8.7* 9.2 9.3 9.1 9.2  MG 2.0 2.0 2.3 2.1 2.0  PHOS 4.3 4.7* 4.5 4.8* 4.7*   GFR: Estimated Creatinine Clearance: 83.9 mL/min (by C-G formula based on SCr of 1.03 mg/dL). Liver Function Tests: Recent Labs  Lab 12/26/20 0848 12/27/20 0304 12/28/20 0534 12/28/20 1437 12/29/20 0102  AST 25 23 29 27 24   ALT 46*  42 38 36 34  ALKPHOS 75 77 78 81 74  BILITOT 0.7 0.2* 0.7 0.5 0.9  PROT 7.1 7.5 7.7 7.4 7.4  ALBUMIN 2.6* 2.8* 2.9* 2.8* 2.8*   No results for input(s): LIPASE, AMYLASE in the last 168 hours. No results for input(s): AMMONIA in the last 168 hours. Coagulation Profile: No results for input(s): INR, PROTIME in the last 168 hours. Cardiac Enzymes: No results for input(s): CKTOTAL, CKMB, CKMBINDEX, TROPONINI in the last 168 hours. BNP (last 3 results) No results for input(s): PROBNP in the last 8760 hours. HbA1C: No results for input(s): HGBA1C in the last 72 hours. CBG: Recent Labs  Lab 12/26/20 1835 12/27/20 0806 12/27/20 1632 12/27/20 2324 12/28/20 2001  GLUCAP 143* 107* 185* 167* 152*   Lipid Profile: No results for input(s): CHOL, HDL, LDLCALC, TRIG, CHOLHDL, LDLDIRECT in the last 72 hours. Thyroid Function Tests: No results for input(s): TSH, T4TOTAL, FREET4, T3FREE, THYROIDAB in the last 72 hours. Anemia Panel: No results for input(s): VITAMINB12, FOLATE, FERRITIN, TIBC, IRON, RETICCTPCT in the last 72 hours. Sepsis Labs: No results for input(s): PROCALCITON, LATICACIDVEN in the last 168 hours.  No results found for this or any previous visit (from the past 240 hour(s)).  RN Pressure Injury Documentation:     Estimated body mass index is 26.11 kg/m as calculated from the following:   Height as of this encounter: 5\' 9"  (1.753 m).   Weight as of this encounter: 80.2 kg.  Malnutrition Type:  Nutrition Problem: Inadequate oral intake Etiology: inability to eat  Malnutrition Characteristics:  Signs/Symptoms: NPO status   Nutrition Interventions:  Interventions: Prostat,MVI,Ensure Enlive (each supplement provides 350kcal and 20 grams of protein)   Radiology Studies: No results found. Scheduled Meds: . (feeding supplement) PROSource Plus  30 mL Oral BID BM  . aspirin  81 mg Oral Daily  . atorvastatin  40 mg Oral Daily  . carvedilol  6.25 mg Oral BID WC  .  Chlorhexidine Gluconate Cloth  6 each Topical Daily  . clopidogrel  75 mg Oral Daily  . divalproex  500 mg Oral Daily  . feeding supplement  237 mL Oral BID BM  . folic acid  1 mg Oral Daily  . furosemide  40 mg Oral Daily  . hydrALAZINE  25 mg Oral TID  . isosorbide mononitrate  30 mg Oral Daily  . levETIRAcetam  500 mg Oral BID  .  magnesium oxide  400 mg Oral BID  . multivitamin with minerals  1 tablet Oral Daily  . polyethylene glycol  17 g Per Tube Daily  . sacubitril-valsartan  1 tablet Oral BID  . silver sulfADIAZINE   Topical BID  . spironolactone  12.5 mg Oral Daily  . thiamine  100 mg Oral Daily   Or  . thiamine  100 mg Intravenous Daily   Continuous Infusions:   LOS: 12 days   Merlene Laughter, DO [facility] PAGER is on AMION  If 7PM-7AM, please contact night-coverage [email or web address]

## 2020-12-29 NOTE — Consult Note (Addendum)
WOC Nurse Consult Note: Reason for Consult:Bilateral great toe ulcers. WOC Nursing is simultaneously consulted with podiatric medicine and they have been contacted for a non-urgent consultation. WOC Nursing saw this patient 11 days ago and I communicate with Dr. Marland Mcalpine via Secure Chat that I will hold off on implementing a POC for the toes until after Podiatry sees in the morning. If no orders arrive from Podiatry, I will implement the WOC Nursing POC. Wound type:Full thickness Pressure Injury POA:N/A  The orders from my associate S. Doty on 12/18/20 were: Wash bilateral great toe wounds with soap and water. Apply Silvadene. Cover with gauze and a few turns of kerlex. To be performed bid  WOC Nursing defers to Podiatry. I will check back tomorrow to see if interim orders are needed.  Thanks, Ladona Mow, MSN, RN, GNP, [facility]  Pager# [phone]

## 2020-12-29 NOTE — Progress Notes (Addendum)
Palliative Medicine Inpatient Follow Up Note  HPI: 55 y.o. male  with past medical history of CHF with LVEF as low as 15% in the past, polysubstance abuse/cocaine positive this admission, admitted on 12/17/2020.  Patient collapsed at city [facility] and EMS called for chest pain and mental status changes.  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  Today's Discussion (12/29/2020):  *Please note that this is a verbal dictation therefore any spelling or grammatical errors are due to the "[employer]agon [employer] One" system interpretation.  Chart reviewed.   [name] is in bed at the time of assessment. Per staff he has not been as compliant with medications today. He is noted to be jittery and shares that he "wants to get up." He then expresses a desire to "walk around".   [name] is too weak to do so on his own as per conversation with the charge RN he almost fell next to his sink earlier in the day and got quite agitated. We discussed treating his agitation and maintaining the CIWA protocol as ordered.   I attempted to call [name]'s brother to provide an update though reached his VM. I plan to try again later in the day.  Objective Assessment: Vital Signs Vitals:   12/28/20 1215 12/28/20 2000  BP: (!) 149/121 124/77  Pulse: 66 81  Resp: 18 20  SpO2: 97% 98%    Intake/Output Summary (Last 24 hours) at 12/29/2020 1507 Last data filed at 12/29/2020 1100 Gross per 24 hour  Intake 340 ml  Output 1100 ml  Net -760 ml   Last Weight  Most recent update: 12/29/2020  5:27 AM   Weight  80.2 kg (176 lb 12.9 oz)           Gen:  AA M - tremulous HEENT: moist mucous membranes CV: Regular rate and rhythm  PULM: On RA ABD: soft/nontender  EXT: No edema  Neuro: Alert and oriented    SUMMARY OF RECOMMENDATIONS   Full Code  Provided a MOST for review - would like to fill in prior to  discharge  Advance Directives - Patient would benefit from completing these to designate an HCPOA and complete a living will  continue ativan IV as well as PO as an option for anxiety/withdrawl  Ongoing PMT support  Time Spent: 25 Greater than 50% of the time was spent in counseling and coordination of care ______________________________________________________________________________________ Addendum:  I was able to speak to patients brother, [name]. He lives in [place] and is not local. He shares with me that [name] has been in and out of jail for a portion of his life. He states that he is a very difficult personality and not someone who has accepted his help in the past. He shares that every time he goes into the hospital it seems that he is worse off. [name] vocalizes that [name] is not safe to live in the streets but recognizes he is stubborn as well. He requests for the patient to go to rehabilitation which I shared with him is what the SW team is working on. He was thankful for the update and realizes if patient should be incapacitated that he will need to be the decision maker.  All questions answered.  Time In: 1755 Time Out: 1815 Total Time: 20 minutes  [name] Palliative Medicine Team Team Cell Phone: [phone] Please utilize secure chat with additional questions, if there is  no response within 30 minutes please call the above phone number  Palliative Medicine Team providers are available by phone from 7am to 7pm daily and can be reached through the team cell phone.  Should this patient require assistance outside of these hours, please call the patient's attending physician.     

## 2020-12-30 ENCOUNTER — Inpatient Hospital Stay (HOSPITAL_COMMUNITY): Payer: Medicaid Other

## 2020-12-30 DIAGNOSIS — L97529 Non-pressure chronic ulcer of other part of left foot with unspecified severity: Secondary | ICD-10-CM

## 2020-12-30 DIAGNOSIS — R45851 Suicidal ideations: Secondary | ICD-10-CM | POA: Diagnosis not present

## 2020-12-30 DIAGNOSIS — Z515 Encounter for palliative care: Secondary | ICD-10-CM | POA: Diagnosis not present

## 2020-12-30 DIAGNOSIS — I5043 Acute on chronic combined systolic (congestive) and diastolic (congestive) heart failure: Secondary | ICD-10-CM | POA: Diagnosis not present

## 2020-12-30 DIAGNOSIS — L97519 Non-pressure chronic ulcer of other part of right foot with unspecified severity: Secondary | ICD-10-CM | POA: Diagnosis not present

## 2020-12-30 DIAGNOSIS — Z7189 Other specified counseling: Secondary | ICD-10-CM | POA: Diagnosis not present

## 2020-12-30 LAB — COMPREHENSIVE METABOLIC PANEL
ALT: 31 U/L (ref 0–44)
AST: 26 U/L (ref 15–41)
Albumin: 3.1 g/dL — ABNORMAL LOW (ref 3.5–5.0)
Alkaline Phosphatase: 78 U/L (ref 38–126)
Anion gap: 11 (ref 5–15)
BUN: 17 mg/dL (ref 6–20)
CO2: 24 mmol/L (ref 22–32)
Calcium: 9.3 mg/dL (ref 8.9–10.3)
Chloride: 102 mmol/L (ref 98–111)
Creatinine, Ser: 1.08 mg/dL (ref 0.61–1.24)
GFR, Estimated: >60 mL/min (ref >60)
Glucose, Bld: 123 mg/dL — ABNORMAL HIGH (ref 70–99)
Potassium: 4.2 mmol/L (ref 3.5–5.1)
Sodium: 137 mmol/L (ref 135–145)
Total Bilirubin: 0.5 mg/dL (ref 0.3–1.2)
Total Protein: 8 g/dL (ref 6.5–8.1)

## 2020-12-30 LAB — CBC WITH DIFFERENTIAL/PLATELET
Abs Immature Granulocytes: 0.01 10*3/uL (ref 0.00–0.07)
Basophils Absolute: 0.1 10*3/uL (ref 0.0–0.1)
Basophils Relative: 1 %
Eosinophils Absolute: 0.2 10*3/uL (ref 0.0–0.5)
Eosinophils Relative: 4 %
HCT: 46.5 % (ref 39.0–52.0)
Hemoglobin: 15.6 g/dL (ref 13.0–17.0)
Immature Granulocytes: 0 %
Lymphocytes Relative: 39 %
Lymphs Abs: 2.5 10*3/uL (ref 0.7–4.0)
MCH: 32 pg (ref 26.0–34.0)
MCHC: 33.5 g/dL (ref 30.0–36.0)
MCV: 95.3 fL (ref 80.0–100.0)
Monocytes Absolute: 1.1 10*3/uL — ABNORMAL HIGH (ref 0.1–1.0)
Monocytes Relative: 17 %
Neutro Abs: 2.5 10*3/uL (ref 1.7–7.7)
Neutrophils Relative %: 39 %
Platelets: 419 10*3/uL — ABNORMAL HIGH (ref 150–400)
RBC: 4.88 MIL/uL (ref 4.22–5.81)
RDW: 14.5 % (ref 11.5–15.5)
WBC: 6.3 10*3/uL (ref 4.0–10.5)
nRBC: 0 % (ref 0.0–0.2)

## 2020-12-30 LAB — MAGNESIUM: Magnesium: 2.1 mg/dL (ref 1.7–2.4)

## 2020-12-30 LAB — GLUCOSE, CAPILLARY: Glucose-Capillary: 165 mg/dL — ABNORMAL HIGH (ref 70–99)

## 2020-12-30 LAB — PHOSPHORUS: Phosphorus: 4.2 mg/dL (ref 2.5–4.6)

## 2020-12-30 IMAGING — DX DG FOOT 2V*L*
3 series · 3 of 3 positions shown · non-contrast
Comparison: [DATE]

CLINICAL DATA: Left great toe ulcer.

EXAM:
LEFT FOOT - 2 VIEW

[foot ap (1 of 2)]
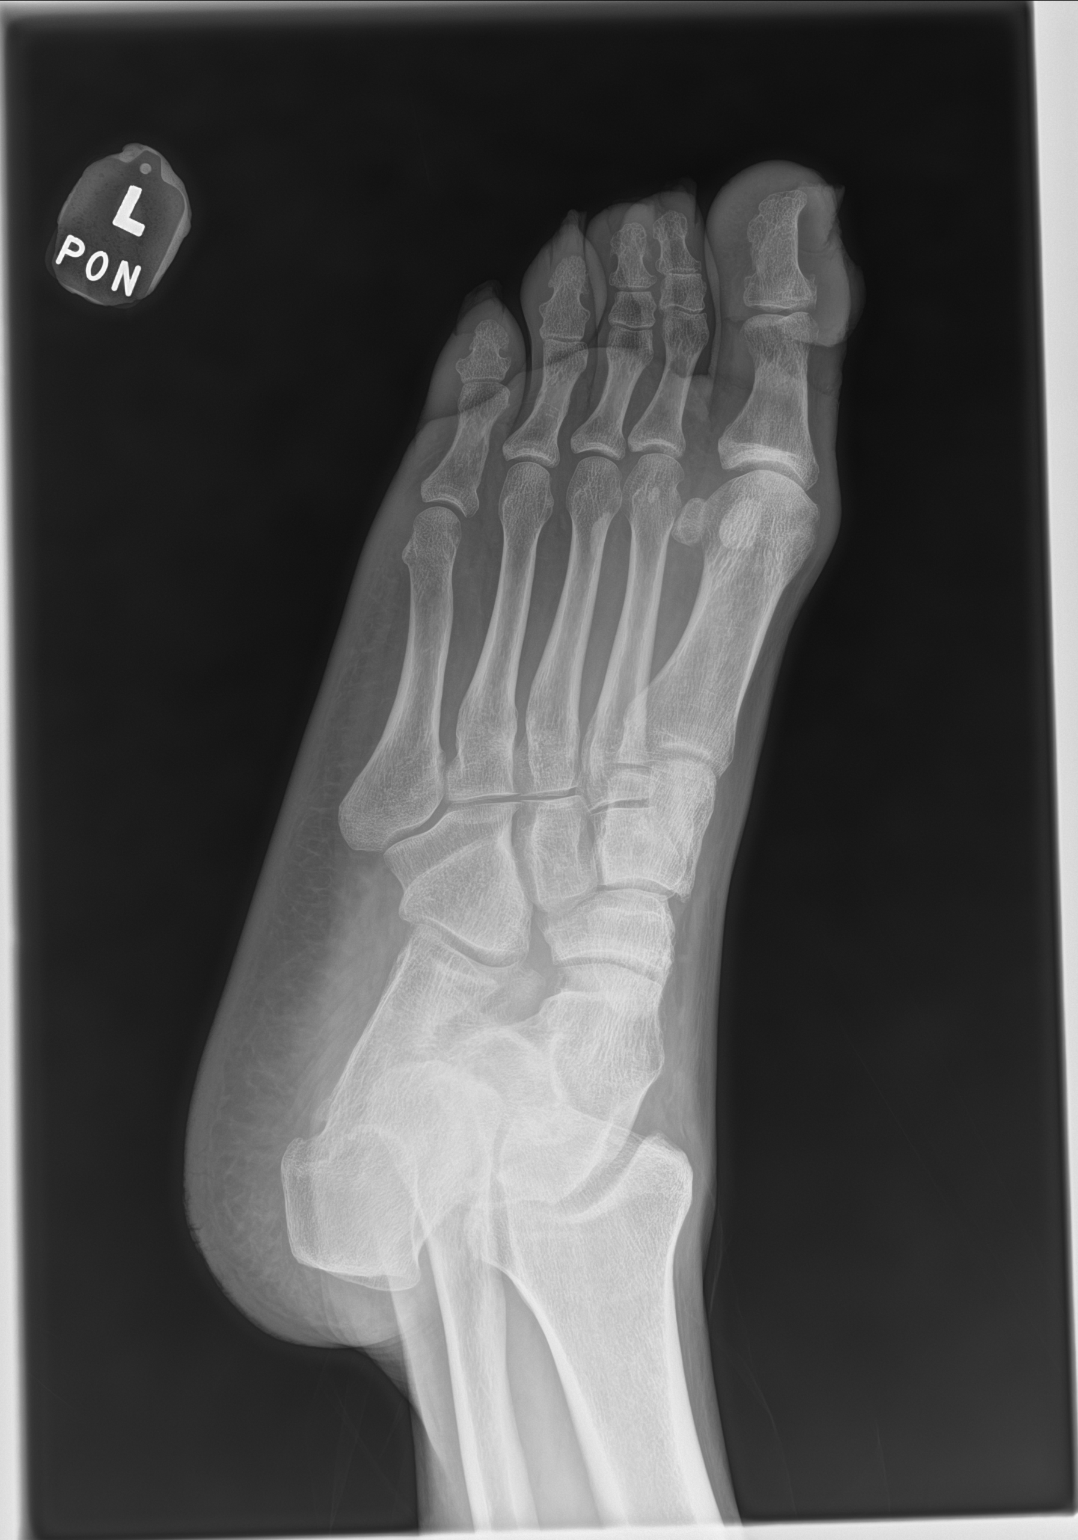

[foot lat]
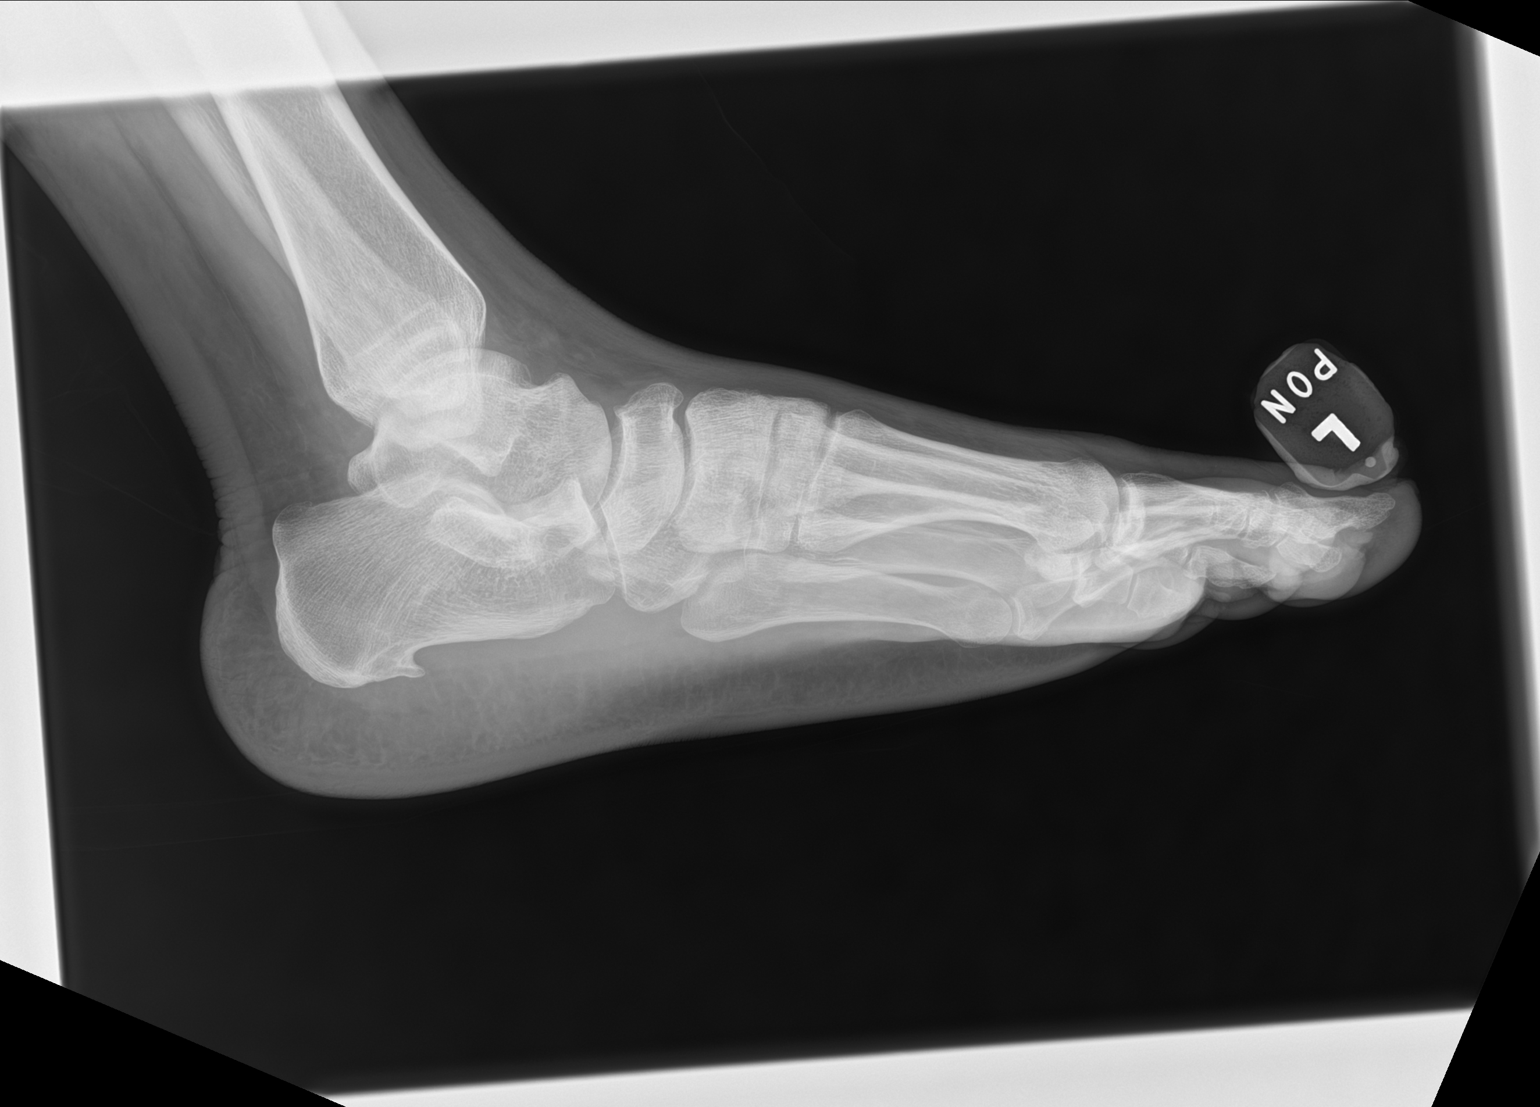

[foot ap (2 of 2)]
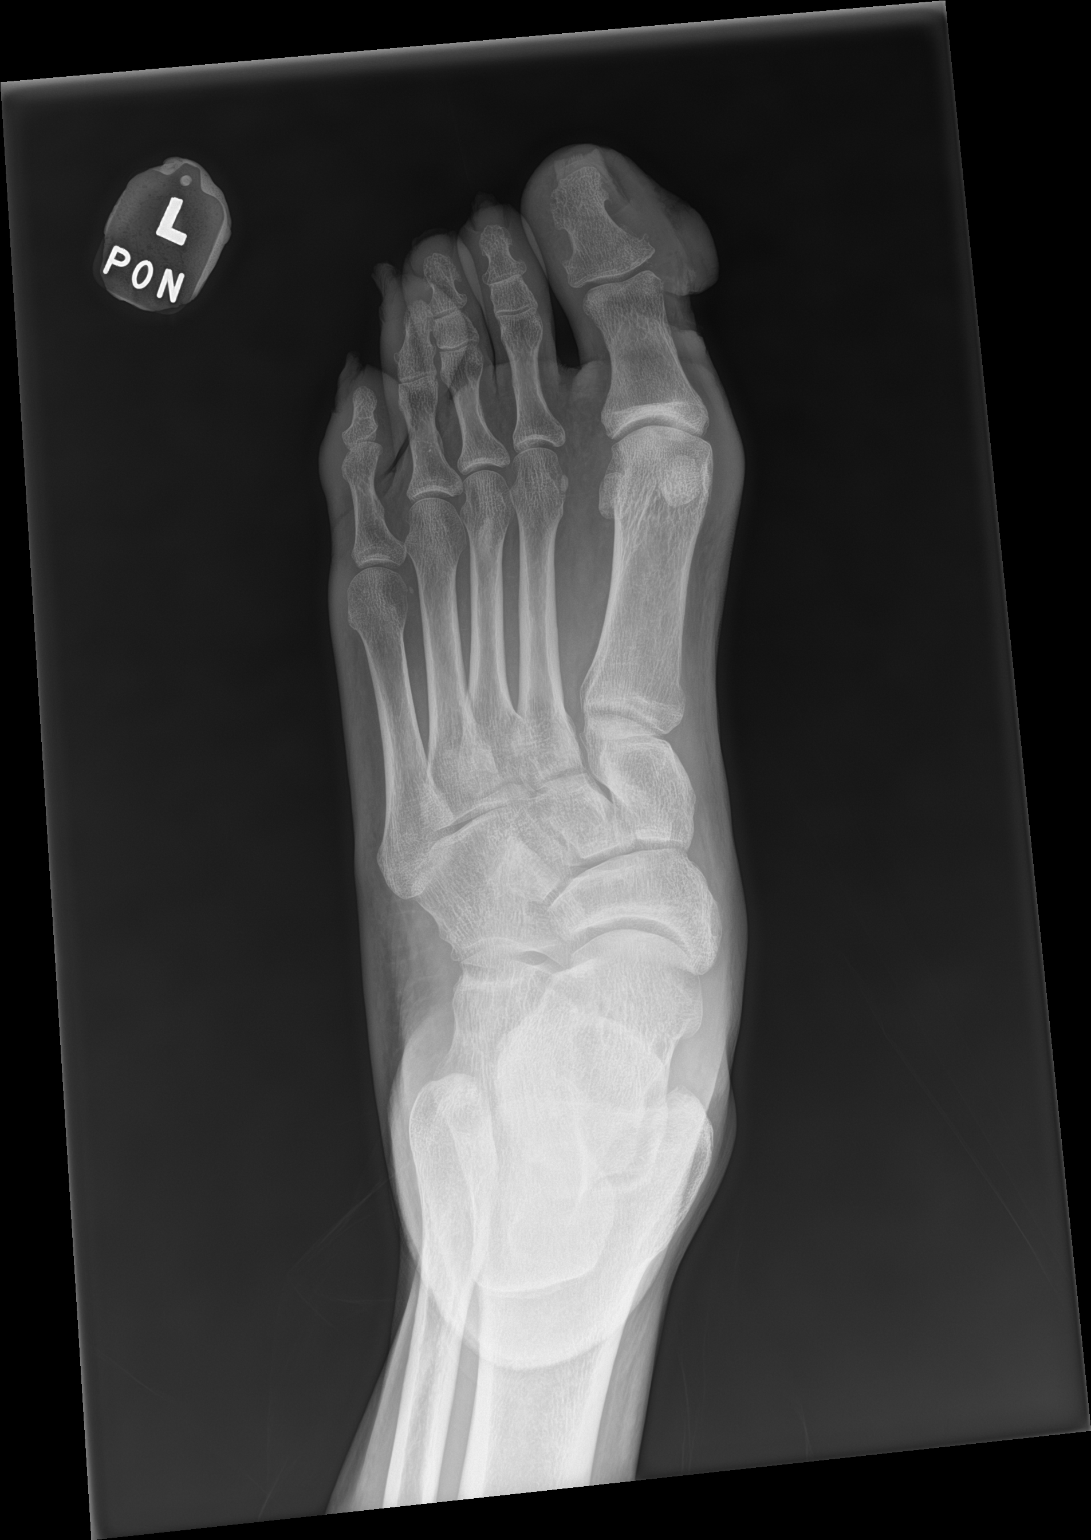

[3 of 3 positions shown; findings below may reference images not displayed]

FINDINGS: Focal soft tissue swelling and defect in the medial aspect of the
great toe. There is an interval small area of bone destruction in
the medial aspect of the base of the 1st distal phalanx. There are
multiple tiny bone fragments at the medial aspect of the 1st IP
joint. There is also a small amount of soft tissue gas in the
swelling medial to the 1st IP joint.

Moderate inferior calcaneal enthesophyte formation.
IMPRESSION: 1. Small area of focal osteomyelitis involving the medial aspect of
the base of the 1st distal phalanx.
2. Tiny amount of soft tissue gas in the medial aspect of the great
toe at the level of the 1st IP joint. This could be due to infection
with a gas-forming organism or represent air introduced through the
patient's area of ulceration.
3. Multiple tiny bone fragments at the medial aspect of the 1st IP
joint, possibly related to chronic infection in that area or
degenerative fragmentation.

## 2020-12-30 IMAGING — DX DG FOOT COMPLETE 3+V*R*
3 series · 3 of 3 positions shown · non-contrast
Comparison: [DATE].  Right foot MR dated [DATE].

CLINICAL DATA: Right great toe ulcer.

EXAM:
RIGHT FOOT COMPLETE - 3+ VIEW

[foot ap]
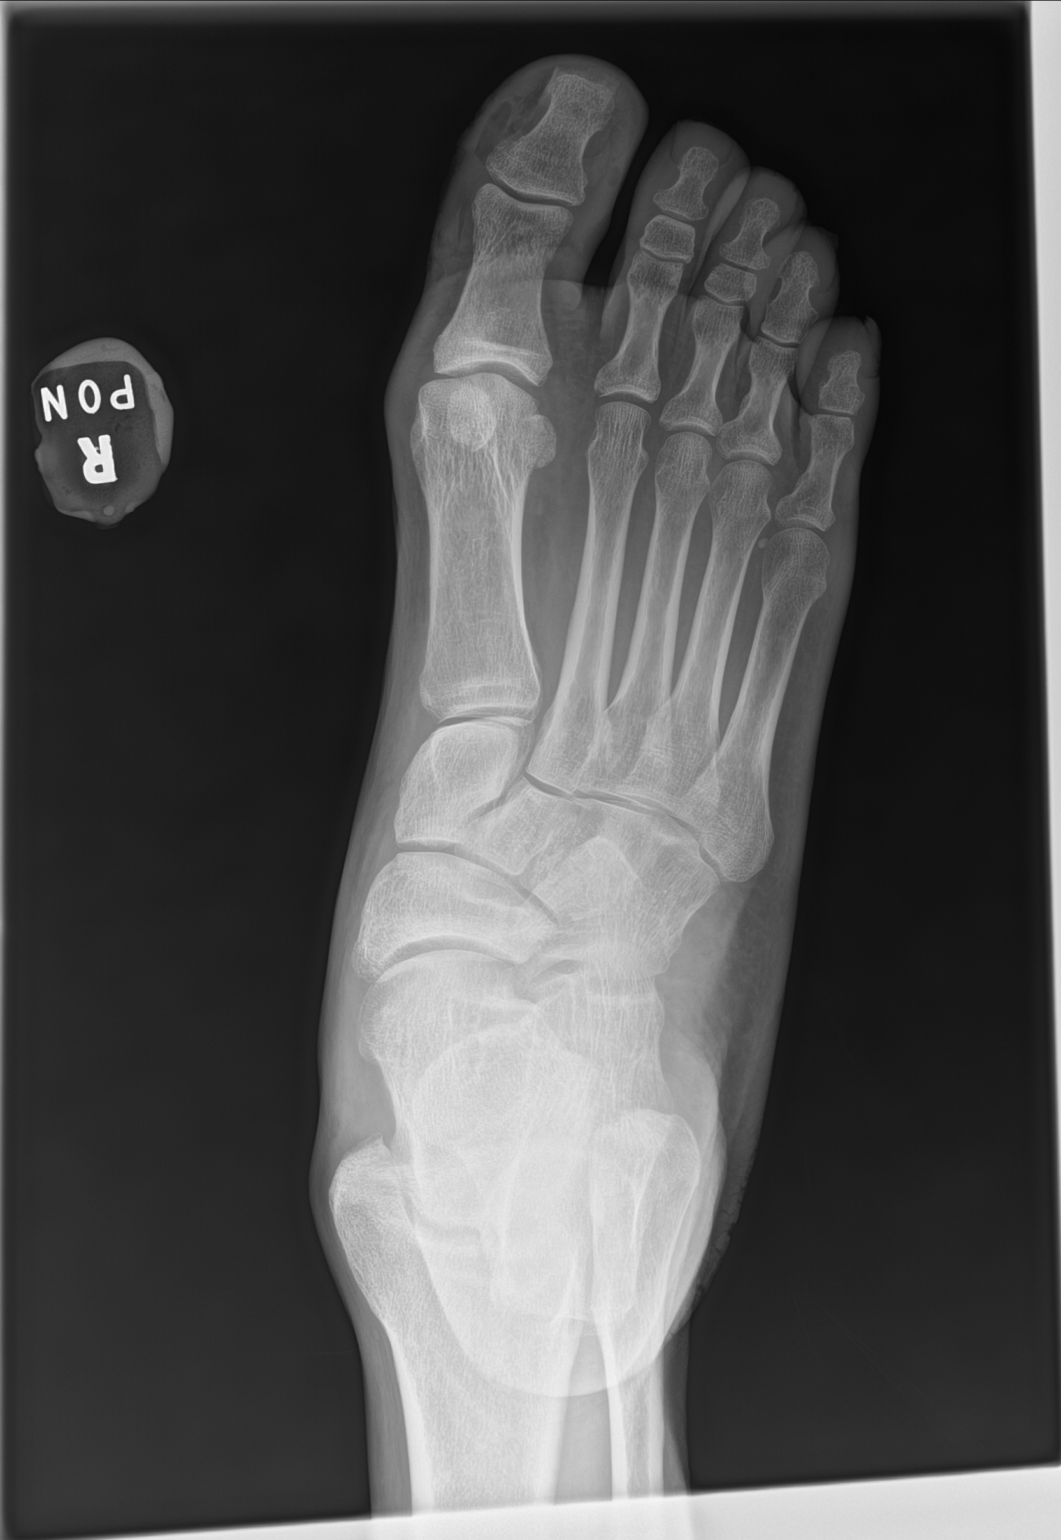

[foot obl]
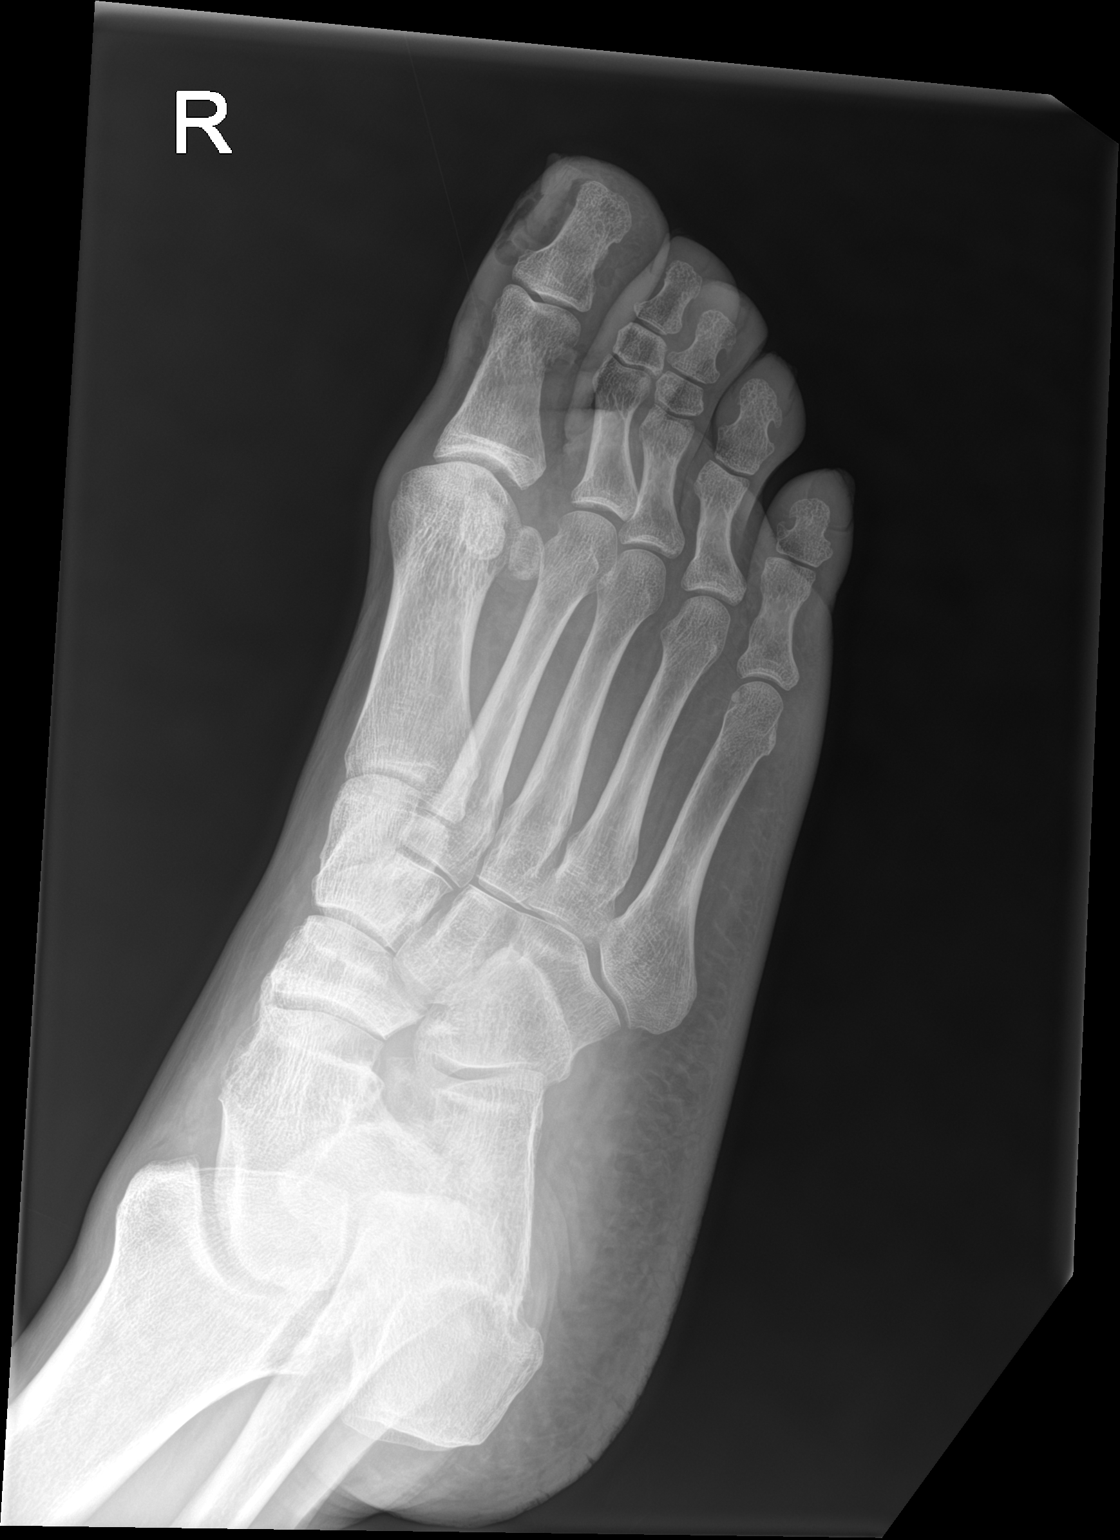

[foot lat]
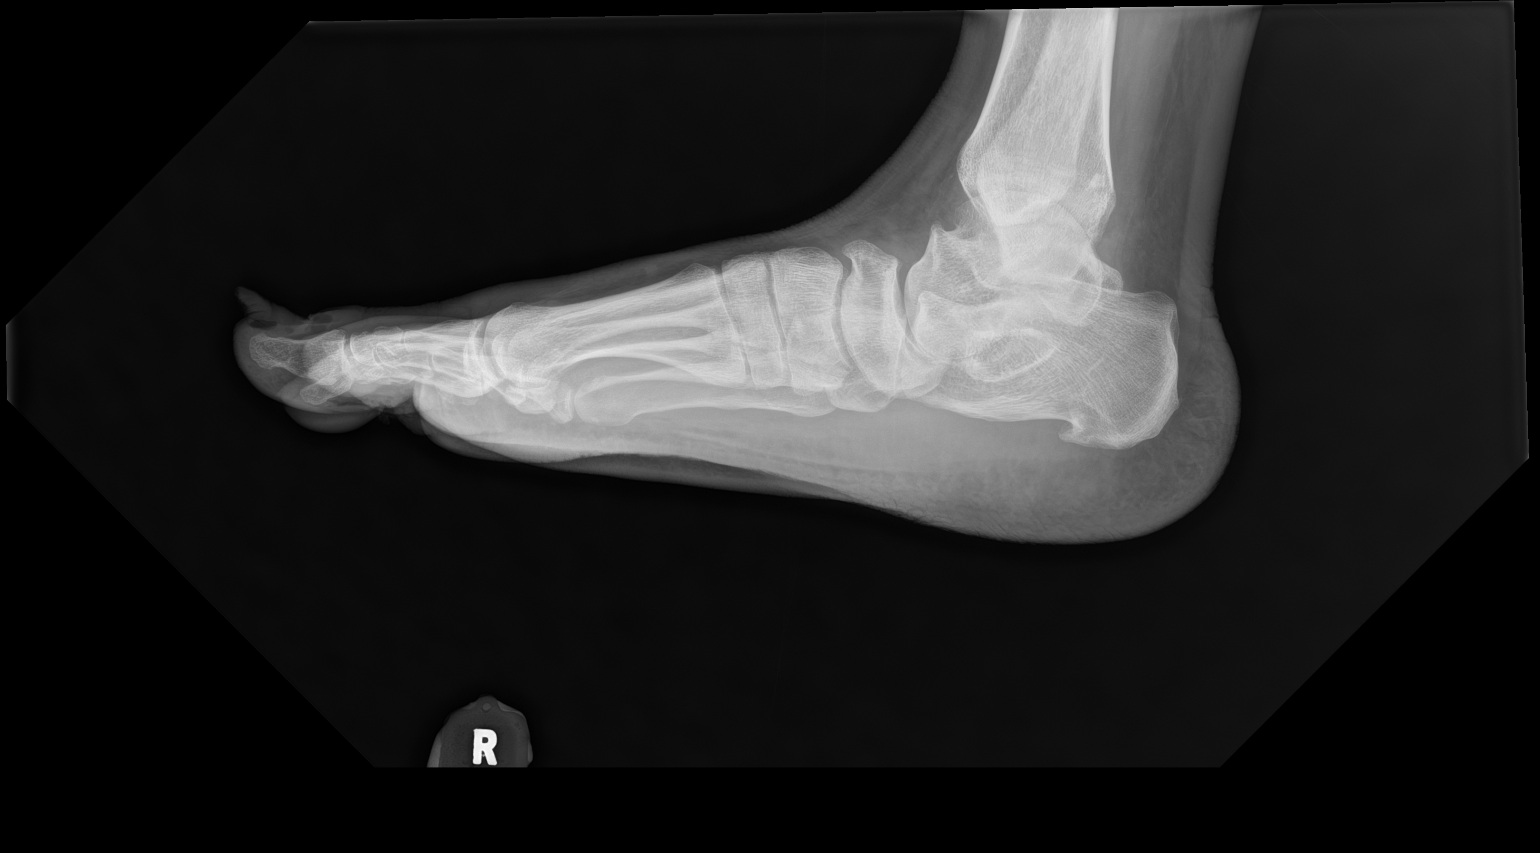

[3 of 3 positions shown; findings below may reference images not displayed]

FINDINGS: Mild soft tissue irregularity in the medial aspect of the great toe.
Multiple interval collections of gas in the great toe medially and
laterally. No bone destruction or periosteal reaction. Moderate
inferior calcaneal enthesophyte formation.
IMPRESSION: Soft tissue ulceration and gas in the great toe without radiographic
evidence of osteomyelitis. The gas is concerning for infection with
a gas-forming organism.

These results will be called to the ordering clinician or
representative by the Radiologist Assistant, and communication
documented in the PACS or [REDACTED].

## 2020-12-30 MED ORDER — COLLAGENASE 250 UNIT/GM EX OINT
TOPICAL_OINTMENT | Freq: Every day | CUTANEOUS | Status: DC
  Filled 2020-12-30: qty 30

## 2020-12-30 NOTE — Progress Notes (Signed)
Orthopedic Tech Progress Note Patient Details:  [name] June 18, 1966 [id number] Left shoes in room. Notified RN Ortho Devices Type of Ortho Device: Postop shoe/boot Ortho Device/Splint Location: BLE Ortho Device/Splint Interventions: Ordered   Post Interventions Patient Tolerated: Other (comment) Instructions Provided: Other (comment)   Michelle Piper 12/30/2020, 12:52 PM

## 2020-12-30 NOTE — Consult Note (Signed)
Reason for Consult: Bilateral hallux ulcers Referring Physician: [name] is an 55 y.o. male.  HPI: This is a 55 year old male admitted for stroke, now in progressive care. Patient complains of bilateral great toe ulcers. States they are painful. States the worsened "quickly" but unsure of chronicity otherwise. Denies N/V/F/Ch.  History reviewed. No pertinent past medical history.  Past Surgical History:  Procedure Laterality Date  . IR ANGIO INTRA EXTRACRAN SEL COM CAROTID INNOMINATE BILAT MOD SED  12/17/2020  . IR ANGIO VERTEBRAL SEL VERTEBRAL BILAT MOD SED  12/17/2020  . RADIOLOGY WITH ANESTHESIA N/A 12/17/2020   Procedure: IR WITH ANESTHESIA;  Surgeon: Radiologist, Medication, MD;  Location: [facility] OR;  Service: Radiology;  Laterality: N/A;    History reviewed. No pertinent family history.  Social History:  reports that he has been smoking cigarettes. He has never used smokeless tobacco. He reports current alcohol use. He reports current drug use. Drug: "Crack" cocaine.  Allergies:  Allergies  Allergen Reactions  . Ibuprofen Anaphylaxis  . Latex Other (See Comments)    Reaction??  . Peanut-Containing Drug Products Other (See Comments)    Allergic to peanut butter (reaction??)  . Tylenol [Acetaminophen] Hives and Nausea And Vomiting    Per pt he can't have it because it will kill him.    Medications: I have reviewed the patient's current medications.  Results for orders placed or performed during the hospital encounter of 12/17/20 (from the past 48 hour(s))  Comprehensive metabolic panel     Status: Abnormal   Collection Time: 12/28/20  2:37 PM  Result Value Ref Range   Sodium 138 135 - 145 mmol/L   Potassium 4.3 3.5 - 5.1 mmol/L   Chloride 102 98 - 111 mmol/L   CO2 24 22 - 32 mmol/L   Glucose, Bld 184 (H) 70 - 99 mg/dL    Comment: Glucose reference range applies only to samples taken after fasting for at least 8 hours.   BUN 22 (H) 6 - 20 mg/dL   Creatinine,  Ser 6.96 0.61 - 1.24 mg/dL   Calcium 9.1 8.9 - 29.5 mg/dL   Total Protein 7.4 6.5 - 8.1 g/dL   Albumin 2.8 (L) 3.5 - 5.0 g/dL   AST 27 15 - 41 U/L   ALT 36 0 - 44 U/L   Alkaline Phosphatase 81 38 - 126 U/L   Total Bilirubin 0.5 0.3 - 1.2 mg/dL   GFR, Estimated >28 >41 mL/min    Comment: (NOTE) Calculated using the CKD-EPI Creatinine Equation (2021)    Anion gap 12 5 - 15    Comment: Performed at [facility], [address], [place], [name] [address]  Magnesium     Status: None   Collection Time: 12/28/20  2:37 PM  Result Value Ref Range   Magnesium 2.1 1.7 - 2.4 mg/dL    Comment: Performed at [facility], [address] [place], [name] [address]  Phosphorus     Status: Abnormal   Collection Time: 12/28/20  2:37 PM  Result Value Ref Range   Phosphorus 4.8 (H) 2.5 - 4.6 mg/dL    Comment: Performed at [facility], [address], [place], [name] [address]  CBC     Status: Abnormal   Collection Time: 12/28/20  2:37 PM  Result Value Ref Range   WBC 5.4 4.0 - 10.5 K/uL   RBC 4.88 4.22 - 5.81 MIL/uL   Hemoglobin 15.0 13.0 - 17.0 g/dL  HCT 46.5 39.0 - 52.0 %   MCV 95.3 80.0 - 100.0 fL   MCH 30.7 26.0 - 34.0 pg   MCHC 32.3 30.0 - 36.0 g/dL   RDW 22.4 82.5 - 00.3 %   Platelets 418 (H) 150 - 400 K/uL   nRBC 0.0 0.0 - 0.2 %    Comment: Performed at [facility], [address], [place], [name] [address]  Glucose, capillary     Status: Abnormal   Collection Time: 12/28/20  8:01 PM  Result Value Ref Range   Glucose-Capillary 152 (H) 70 - 99 mg/dL    Comment: Glucose reference range applies only to samples taken after fasting for at least 8 hours.  CBC with Differential/Platelet     Status: Abnormal   Collection Time: 12/29/20  1:02 AM  Result Value Ref Range   WBC 5.2 4.0 - 10.5 K/uL   RBC 4.73 4.22 - 5.81 MIL/uL   Hemoglobin 14.7 13.0 - 17.0 g/dL   HCT 89.1 69.4 - 50.3 %   MCV 95.8 80.0 - 100.0 fL   MCH 31.1 26.0 - 34.0 pg   MCHC 32.5 30.0 -  36.0 g/dL   RDW 88.8 28.0 - 03.4 %   Platelets 411 (H) 150 - 400 K/uL   nRBC 0.0 0.0 - 0.2 %   Neutrophils Relative % 32 %   Neutro Abs 1.7 1.7 - 7.7 K/uL   Lymphocytes Relative 42 %   Lymphs Abs 2.2 0.7 - 4.0 K/uL   Monocytes Relative 19 %   Monocytes Absolute 1.0 0.1 - 1.0 K/uL   Eosinophils Relative 6 %   Eosinophils Absolute 0.3 0.0 - 0.5 K/uL   Basophils Relative 1 %   Basophils Absolute 0.1 0.0 - 0.1 K/uL   Immature Granulocytes 0 %   Abs Immature Granulocytes 0.01 0.00 - 0.07 K/uL    Comment: Performed at [facility], [address], [place], [name] [address]  Comprehensive metabolic panel     Status: Abnormal   Collection Time: 12/29/20  1:02 AM  Result Value Ref Range   Sodium 134 (L) 135 - 145 mmol/L   Potassium 4.1 3.5 - 5.1 mmol/L   Chloride 102 98 - 111 mmol/L   CO2 23 22 - 32 mmol/L   Glucose, Bld 184 (H) 70 - 99 mg/dL    Comment: Glucose reference range applies only to samples taken after fasting for at least 8 hours.   BUN 20 6 - 20 mg/dL   Creatinine, Ser 5.05 0.61 - 1.24 mg/dL   Calcium 9.2 8.9 - 69.7 mg/dL   Total Protein 7.4 6.5 - 8.1 g/dL   Albumin 2.8 (L) 3.5 - 5.0 g/dL   AST 24 15 - 41 U/L   ALT 34 0 - 44 U/L   Alkaline Phosphatase 74 38 - 126 U/L   Total Bilirubin 0.9 0.3 - 1.2 mg/dL   GFR, Estimated >94 >80 mL/min    Comment: (NOTE) Calculated using the CKD-EPI Creatinine Equation (2021)    Anion gap 9 5 - 15    Comment: Performed at [facility], [address], [place], [name] [address]  Magnesium     Status: None   Collection Time: 12/29/20  1:02 AM  Result Value Ref Range   Magnesium 2.0 1.7 - 2.4 mg/dL    Comment: Performed at [facility], [address], [place], [name] [address]  Phosphorus     Status: Abnormal   Collection  Time: 12/29/20  1:02 AM  Result Value Ref Range   Phosphorus 4.7 (H) 2.5 - 4.6 mg/dL    Comment: Performed at [facility], [address], [place], [name] [address]    No results  found.  ROS Blood pressure (!) 129/109, pulse 83, temperature 98.1 F (36.7 C), temperature source Oral, resp. rate 18, height 5\' 9"  (1.753 m), weight 80.2 kg, SpO2 100 %.  Vitals:   12/29/20 2231 12/30/20 0758  BP: 125/76 (!) 129/109  Pulse: 88 83  Resp:  18  Temp:  98.1 F (36.7 C)  SpO2:  100%    General AA&O x3. Normal mood and affect.  Vascular Dorsalis pedis and posterior tibial pulses  barely palpable bilaterally  Capillary refill delayed ~ 5 seconds to both hallux Pedal hair growth absent.  Neurologic Epicritic sensation grossly present.  Dermatologic (Wound)  Bilateral medial hallux ulcerations, with overlying eschar. Unclear depth. No palpable bone in the wound. Left hallux with significant overlying hyperkeratosis. No surrounding warmth or erythema noted.  Orthopedic: Motor intact BLE.    Assessment/Plan:  BLE Hallux Ulcers -Imaging:  Order XR bilat, Non-invasive vascular studies. -Antibiotics: no abx indicated at this time -WB Status: WBAT in surgical shoes bilat. Ordered -Wound Care: Order santyl WTD, to be applied daily -Surgical Plan: I am concerned of chronic osteomyelitis. The wounds appear deep. XR pending. May benefit from amputation if indicated. Patient amenable. Will continue to follow.  01/01/21 12/30/2020, 11:07 AM   Best available via secure chat for questions or concerns.

## 2020-12-30 NOTE — Progress Notes (Signed)
PROGRESS NOTE    [name]  EHU:[id number] DOB: [birth year] DOA: 12/17/2020 PCP: [name]   Brief Narrative:  The patient is a 55 year old gentleman, initially with unknown identity brought to the ER after he complained of chest pain and became unresponsive with left-sided weakness and left gaze deviation. Was intubated in the ER. Apparently seen by bystanders when patient complained of chest pain and all of a sudden he became unresponsive. 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 1/17-1/20, admitted to ICU, extubated to 4 L oxygen on 1/20. 1/21, transferred to [name] service, progressive bed. Patient has different MRN in our hospital system, he has frequent hospitalization and was recently admitted and discharged a week ago with diabetic toe ulcer. Review his previous records indicate that he is homeless, he is noncompliant, uses cocaine. He has a history of COPD, bipolar 1 disorder, known chronic heart failure with recent ejection fraction 40%, ischemic heart disease and previously declined PCI.  Significant diagnostics: 1/17 CT head: Left cerebellar hypodensity concerning for acute/subacute infarct. No intracranial hemorrhage. Small right parietal scalp hematoma 1/17 CTA head and neck: Non opacification of the left vertebral artery with retrograde filling of the distal left V4 segment. Bilateral carotid bifurcation atheromatous disease with 50% right and 30% left carotid bulb narrowing. Mild left P2 segment narrowing. 2D echo 1/18>>>EF <20%, hypokinesis, mod LVH, grade 2 diastolic dysfunction,  EEG 1/19>>This studyshowed evidence of epileptogenicity arising fromright>leftfrontal region. There is also moderatediffuse encephalopathy, nonspecific etiology butcould be secondary tosedation.No seizures were seen throughout the  recording.  Because the patient continued to have worsening neurological symptoms today became unresponsive a stat head CT was obtained again and he underwent EEG monitoring.  Code stroke was called today by the nursing.  EEG was within normal limits.  Patient did admit to having some suicidal ideation so he was placed on a one-to-one sitter and psychiatry is consulted for further evaluation.  Psych evaluated and deemed him not to be a risk and did not feel as if he was a risk to himself or others. Neurology evaluated and recommended ASA 81 mg Daily and Plavix 75 mg for 3 weeks and then ASA alone. Repeat MRI showed Acute Infarct involving the Right Superior Cerebellar and Right Middle Cerebellar Peduncle. They recommend Keppra 500 mg po BID   Palliative is consulted and patient does not want any of his children to be informed but wanted to his friend Alac to be notified.  He was placed on CIWA Protocol.  Continues to be agitated and continues to intermittently refuse his care and refused medications.  Was complaining of some foot pain will consult podiatry for further evaluation given his wounds.  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 They recommend Keppra 500 mg po BID"  -MRA of the head, acute occlusion of the right cerebellar artery  -LDL 72, start Lipitor 40.  Liver function tests normalized. -A1c 6.4 -EEG with diffuse encephalopathy -Patient was treated with heparin.  Now on aspirin and Plavix for 3 weeks then aspirin  alone. 1/25, patient complained of persistent right-sided weakness that he claims worsening,  -He had a repeat head CT yesterday and again had some neurological deficits today still repeat head code stroke was called today. -Underwent a repeat MRI and EEG done and showed "This study is within normal limits. No seizures or epileptiform discharges were seen throughout the recording." -Neurology recommending DAPT for 3 weeks then ASA alone -palliative care consulted for goals of care discussion  Acute on chronic systolic and diastolic congestive heart failure: -Recent echocardiogram with ejection fraction 40% -New echocardiogram ejection fraction less than 20%. -On aspirin, Lipitor, Entresto, aldactone, coreg and Imdur and oral lasix.  -Kidney functions are stable. -Patient is noncompliant and will be difficult to treat. -Strict I's and O's; He is - 8.962 liters  -Continue to Monitor closely  Questionable seizure activity:  -No more seizure activities.  Neurology recommended no AEDs at this time but then he became altered again this afternoon so neurology repeated a stat EEG and placed him on low-dose Keppra 500 twice daily. -He has impulsiveness and some tremors, unlikely seizure.  Acute Hypoxemic Respiratory Failure:  -Secondary to stroke.  Currently on room air. -SpO2: 98 % O2 Flow Rate (L/min): 4 L/min FiO2 (%): 30 % -Continue monitor respiratory status carefully and if necessary will place back on supplemental oxygen via nasal cannula  Acute metabolic toxic encephalopathy:  -Multifactorial.  Stroke, drug use, acute respiratory failure and sedation.  Stabilizing. Patient has baseline behavior problems and intermittent agitations, aggressiveness towards the staff.  He endorsed some psychotic symptoms.  Seen by psychiatry and cleared for any lethality. -We will place on a CIWA protocol given his history of alcohol abuse continue monitor given that he has been significantly  agitated -Will need placement to skilled nursing facility and he is not only able to be placed.  Acute liver failure: -Improved.  Probably due to ischemic liver. -AST is 24 and ALT is 34 and repeat Labs are pending this AM -Continue to Monitor and Trend   Hyperphosphatemia -Mild as Phos Level is 4.7 yesterday and repeat this AM is pending  -Continue to Monitor and Trend -Repeat Phos Level in the AM   Cocaine use:  -Counseled to quit.  Hyponatremia -Mild at 134 yesterday  -Continue to monitor and trend repeat CMP in a.m.  Bipolar disorder/homelessness: Poor compliance. Homelessness.  Consulted psychiatry.    Admits to having some suicidal ideation so we will place him on a one-to-one sitter but Psych evaluated and cleared him a danger to himself  Deconditioning  -Continue mobilizing.  Referred to SNF. -Patient is throwing away his telemetry and taking off his IV lines, discontinue telemetry.  Transferred to MedSurg bed. -Transfer to skilled nursing facility if available per social work placement will be extremely difficult.  Cannot safely discharge the patient home as he lives by himself  Bilateral Feet Hallux ulcers -Initially thought to be Probably ischemic leg ulcers.  Local wound care.   -With acute a stroke, behavioral problems, ? surgical candidate at this time. -Will consider ABI and X-Rays but will defer to Podiatry;Podiatry has obtaining Bilateral Foot X-Rays and have ordered noninvasive vascular studies -Left Foot X-Ray showed "Small area of focal osteomyelitis involving the medial aspect of the base of the 1st distal phalanx. Tiny amount of soft tissue gas in the medial aspect of the great toe at the level of the 1st IP joint. This could be due to  infection with a gas-forming organism or represent air introduced through the patient's area of ulceration. Multiple tiny bone fragments at the medial aspect of the 1st IP joint, possibly related to chronic infection in that  area or degenerative fragmentation. " -Right Foot X-Ray showed "Soft tissue ulceration and gas in the great toe without radiographic evidence of osteomyelitis. The gas is concerning for infection with a gas-forming organism." -Currently he is Afebrile and has No Leukocytosis so there is no real indication for antibiotics at this time but will wait for possible debridement and then obtain cultures intraoperatively for targeted therapy -Podiatry is recommending weightbearing in surgical shoes bilaterally and they have been ordered next-wound care as recommended by Santyl wet-to-dry and this is to be applied daily -Pending further podiatry recommendations may benefit from amputations and patient would be amenable but ? If he is a candidate due to recent Stroke  DVT prophylaxis: SCDs Code Status: FULL CODE Family Communication: No family present at bedside Disposition Plan: SNF when able as he is not a safe discharge disposition for home given his behavioral issues and because of his significant weakness  Status is: Inpatient  Remains inpatient appropriate because:Unsafe d/c plan, IV treatments appropriate due to intensity of illness or inability to take PO and Inpatient level of care appropriate due to severity of illness   Dispo: The patient is from: Home              Anticipated d/c is to: SNF              Anticipated d/c date is: 3 days              Patient currently is not medically stable to d/c.   Difficult to place patient Yes  Consultants:   Neurology  PCCM transfer  Cardiology  Palliative care Medicine  Will discuss with podiatry and ask for a formal Consult   Procedures: See above  Repeat EEG 12/26/2020: This study is within normal limits. No seizures or epileptiform discharges were seen throughout the recording.  Antimicrobials:  Anti-infectives (From admission, onward)   Start     Dose/Rate Route Frequency Ordered Stop   12/17/20 1450  ceFAZolin (ANCEF) 2-4  GM/100ML-% IVPB       Note to Pharmacy: Cristy Friedlander   : cabinet override      12/17/20 1450 12/17/20 1911        Subjective: Seen and examined at bedside again was resting in his bed and still complaining of pain in his feet.  No nausea or vomiting.  Continues to refuse care intermittently.  No nausea or vomiting.  No other concerns or close at this time.  Objective: Vitals:   12/29/20 1643 12/29/20 2231 12/30/20 0758 12/30/20 1154  BP: 119/82 125/76 (!) 129/109 (!) 80/56  Pulse: 98 88 83 85  Resp: 18  18 18   Temp:   98.1 F (36.7 C)   TempSrc:   Oral   SpO2: 100%  100% 98%  Weight:      Height:        Intake/Output Summary (Last 24 hours) at 12/30/2020 1508 Last data filed at 12/30/2020 0855 Gross per 24 hour  Intake 840 ml  Output 400 ml  Net 440 ml   Filed Weights   12/23/20 0500 12/28/20 0517 12/29/20 0526  Weight: 85.1 kg 84.8 kg 80.2 kg   Examination: Physical Exam:  Constitutional: WN/WD overweight AAM in NAD and was resting in bed Eyes: Lids and conjunctivae normal, sclerae  anicteric  ENMT: External Ears, Nose appear normal. Grossly normal hearing.  Neck: Appears normal, supple, no cervical masses, normal ROM, no appreciable thyromegaly; no JVD Respiratory: Clear to auscultation bilaterally, no wheezing, rales, rhonchi or crackles. Normal respiratory effort and patient is not tachypenic. No accessory muscle use. Unlabored breathing  Cardiovascular: RRR, no murmurs / rubs / gallops. S1 and S2 auscultated. Minimal LE Edema  Abdomen: Soft, non-tender, non-distended. Bowel sounds positive.  GU: Deferred. Musculoskeletal: No clubbing / cyanosis of digits/nails. Bilateral LE Toe Ulcers  Skin: No rashes, lesions noted but has Toe Ulcers. No induration; Warm and dry.  Neurologic: CN 2-12 grossly intact with no focal deficits. Romberg sign and cerebellar reflexes not assessed.  Psychiatric: Slightly judgment and insight. Alert and oriented x 3. Normal mood and  appropriate affect.   Data Reviewed: I have personally reviewed following labs and imaging studies  CBC: Recent Labs  Lab 12/26/20 0848 12/27/20 0304 12/28/20 0534 12/28/20 1437 12/29/20 0102  WBC 5.3 6.0 6.2 5.4 5.2  NEUTROABS 2.4 2.2 2.4  --  1.7  HGB 14.1 15.1 15.1 15.0 14.7  HCT 43.6 46.5 44.9 46.5 45.3  MCV 95.4 95.5 94.1 95.3 95.8  PLT 374 394 414* 418* 411*   Basic Metabolic Panel: Recent Labs  Lab 12/26/20 0848 12/27/20 0304 12/28/20 0534 12/28/20 1437 12/29/20 0102  NA 134* 136 136 138 134*  K 4.0 4.5 4.6 4.3 4.1  CL 98 103 101 102 102  CO2 25 22 23 24 23   GLUCOSE 161* 113* 111* 184* 184*  BUN 19 21* 22* 22* 20  CREATININE 1.01 1.10 1.02 1.09 1.03  CALCIUM 8.7* 9.2 9.3 9.1 9.2  MG 2.0 2.0 2.3 2.1 2.0  PHOS 4.3 4.7* 4.5 4.8* 4.7*   GFR: Estimated Creatinine Clearance: 83.9 mL/min (by C-G formula based on SCr of 1.03 mg/dL). Liver Function Tests: Recent Labs  Lab 12/26/20 0848 12/27/20 0304 12/28/20 0534 12/28/20 1437 12/29/20 0102  AST 25 23 29 27 24   ALT 46* 42 38 36 34  ALKPHOS 75 77 78 81 74  BILITOT 0.7 0.2* 0.7 0.5 0.9  PROT 7.1 7.5 7.7 7.4 7.4  ALBUMIN 2.6* 2.8* 2.9* 2.8* 2.8*   No results for input(s): LIPASE, AMYLASE in the last 168 hours. No results for input(s): AMMONIA in the last 168 hours. Coagulation Profile: No results for input(s): INR, PROTIME in the last 168 hours. Cardiac Enzymes: No results for input(s): CKTOTAL, CKMB, CKMBINDEX, TROPONINI in the last 168 hours. BNP (last 3 results) No results for input(s): PROBNP in the last 8760 hours. HbA1C: No results for input(s): HGBA1C in the last 72 hours. CBG: Recent Labs  Lab 12/27/20 0806 12/27/20 1632 12/27/20 2324 12/28/20 2001 12/30/20 1148  GLUCAP 107* 185* 167* 152* 165*   Lipid Profile: No results for input(s): CHOL, HDL, LDLCALC, TRIG, CHOLHDL, LDLDIRECT in the last 72 hours. Thyroid Function Tests: No results for input(s): TSH, T4TOTAL, FREET4, T3FREE,  THYROIDAB in the last 72 hours. Anemia Panel: No results for input(s): VITAMINB12, FOLATE, FERRITIN, TIBC, IRON, RETICCTPCT in the last 72 hours. Sepsis Labs: No results for input(s): PROCALCITON, LATICACIDVEN in the last 168 hours.  No results found for this or any previous visit (from the past 240 hour(s)).  RN Pressure Injury Documentation:     Estimated body mass index is 26.11 kg/m as calculated from the following:   Height as of this encounter: 5\' 9"  (1.753 m).   Weight as of this encounter: 80.2 kg.  Malnutrition Type:  Nutrition Problem: Inadequate oral intake Etiology: inability to eat  Malnutrition Characteristics:  Signs/Symptoms: NPO status   Nutrition Interventions:  Interventions: Prostat,MVI,Ensure Enlive (each supplement provides 350kcal and 20 grams of protein)   Radiology Studies: DG Foot 2 Views Left  Result Date: 12/30/2020 CLINICAL DATA:  Left great toe ulcer. EXAM: LEFT FOOT - 2 VIEW COMPARISON:  11/27/2020 FINDINGS: Focal soft tissue swelling and defect in the medial aspect of the great toe. There is an interval small area of bone destruction in the medial aspect of the base of the 1st distal phalanx. There are multiple tiny bone fragments at the medial aspect of the 1st IP joint. There is also a small amount of soft tissue gas in the swelling medial to the 1st IP joint. Moderate inferior calcaneal enthesophyte formation. IMPRESSION: 1. Small area of focal osteomyelitis involving the medial aspect of the base of the 1st distal phalanx. 2. Tiny amount of soft tissue gas in the medial aspect of the great toe at the level of the 1st IP joint. This could be due to infection with a gas-forming organism or represent air introduced through the patient's area of ulceration. 3. Multiple tiny bone fragments at the medial aspect of the 1st IP joint, possibly related to chronic infection in that area or degenerative fragmentation. Electronically Signed   By: Beckie SaltsSteven  Reid  M.D.   On: 12/30/2020 14:33   DG Foot Complete Right  Result Date: 12/30/2020 CLINICAL DATA:  Right great toe ulcer. EXAM: RIGHT FOOT COMPLETE - 3+ VIEW COMPARISON:  12/08/2020.  Right foot MR dated 12/08/2020. FINDINGS: Mild soft tissue irregularity in the medial aspect of the great toe. Multiple interval collections of gas in the great toe medially and laterally. No bone destruction or periosteal reaction. Moderate inferior calcaneal enthesophyte formation. IMPRESSION: Soft tissue ulceration and gas in the great toe without radiographic evidence of osteomyelitis. The gas is concerning for infection with a gas-forming organism. These results will be called to the ordering clinician or representative by the Radiologist Assistant, and communication documented in the PACS or [employer] Dashboard. Electronically Signed   By: Beckie SaltsSteven  Reid M.D.   On: 12/30/2020 14:35   Scheduled Meds: . (feeding supplement) PROSource Plus  30 mL Oral BID BM  . aspirin  81 mg Oral Daily  . atorvastatin  40 mg Oral Daily  . carvedilol  6.25 mg Oral BID WC  . Chlorhexidine Gluconate Cloth  6 each Topical Daily  . clopidogrel  75 mg Oral Daily  . collagenase   Topical Daily  . divalproex  500 mg Oral Daily  . feeding supplement  237 mL Oral BID BM  . folic acid  1 mg Oral Daily  . furosemide  40 mg Oral Daily  . hydrALAZINE  25 mg Oral TID  . isosorbide mononitrate  30 mg Oral Daily  . levETIRAcetam  500 mg Oral BID  . magnesium oxide  400 mg Oral BID  . multivitamin with minerals  1 tablet Oral Daily  . polyethylene glycol  17 g Per Tube Daily  . sacubitril-valsartan  1 tablet Oral BID  . silver sulfADIAZINE   Topical BID  . spironolactone  12.5 mg Oral Daily  . thiamine  100 mg Oral Daily   Or  . thiamine  100 mg Intravenous Daily   Continuous Infusions:   LOS: 13 days   Merlene Laughtermair Latif Able Malloy, DO [facility] PAGER is on AMION  If 7PM-7AM, please contact night-coverage [email or web address]

## 2020-12-30 NOTE — Consult Note (Signed)
WOC Nurse Consult Note: Reason for Consult: Follow up from yesterday's visit.  Dr. Samuella Cota (Podiatry) has just seen and will follow. Amputations are being considered. Wound type: Infectious Pressure Injury POA: N/A  I will defer to Dr. Samuella Cota in POC and not see or follow.  WOC nursing team will not follow, but will remain available to this patient, the nursing and medical teams.  Please re-consult if needed. Thanks, Ladona Mow, MSN, RN, GNP, [facility] [place]  Pager# [phone]

## 2020-12-30 NOTE — Progress Notes (Signed)
   Palliative Medicine Inpatient Follow Up Note  HPI: 55 y.o. male  with past medical history of CHF with LVEF as low as 15% in the past, polysubstance abuse/cocaine positive this admission, admitted on 12/17/2020.  Patient collapsed at city [facility] and EMS called for chest pain and mental status changes.  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  Today's Discussion (12/30/2020):  *Please note that this is a verbal dictation therefore any spelling or grammatical errors are due to the "[employer] One" system interpretation.  Chart reviewed.   I checked in with [name] and his RN, Neshell this afternoon. Neshell shares that Linkon has been more compliant today.  We discussed the idea of toe amputations. Nursing shared concerns that patient will be very difficult from the perspective of wound dressing changes which I agree with.   At this point patient is wanting full aggressive care and is amenable to amputation(s).  Objective Assessment: Vital Signs Vitals:   12/29/20 2231 12/30/20 0758  BP: 125/76 (!) 129/109  Pulse: 88 83  Resp:  18  Temp:  98.1 F (36.7 C)  SpO2:  100%    Intake/Output Summary (Last 24 hours) at 12/30/2020 1151 Last data filed at 12/30/2020 0855 Gross per 24 hour  Intake 840 ml  Output 400 ml  Net 440 ml   Last Weight  Most recent update: 12/29/2020  5:27 AM   Weight  80.2 kg (176 lb 12.9 oz)           Gen:  AA M - tremulous HEENT: moist mucous membranes CV: Regular rate and rhythm  PULM: On RA ABD: soft/nontender  EXT: No edema  Neuro: Alert and oriented    SUMMARY OF RECOMMENDATIONS   Full Code / Full scope of care  PMT will sign off given patients goals but please reach out to the team if there is a clinical change or additional Palliative care is needed  Total Time: 15 minutes  Lamarr Lulas [facility] Palliative Medicine  Team Team Cell Phone: [phone] Please utilize secure chat with additional questions, if there is no response within 30 minutes please call the above phone number  Palliative Medicine Team providers are available by phone from 7am to 7pm daily and can be reached through the team cell phone.  Should this patient require assistance outside of these hours, please call the patient's attending physician.

## 2020-12-31 ENCOUNTER — Inpatient Hospital Stay (HOSPITAL_COMMUNITY): Payer: Medicaid Other

## 2020-12-31 DIAGNOSIS — L0390 Cellulitis, unspecified: Secondary | ICD-10-CM

## 2020-12-31 DIAGNOSIS — R569 Unspecified convulsions: Secondary | ICD-10-CM | POA: Diagnosis not present

## 2020-12-31 DIAGNOSIS — I63441 Cerebral infarction due to embolism of right cerebellar artery: Secondary | ICD-10-CM | POA: Diagnosis not present

## 2020-12-31 DIAGNOSIS — I5043 Acute on chronic combined systolic (congestive) and diastolic (congestive) heart failure: Secondary | ICD-10-CM | POA: Diagnosis not present

## 2020-12-31 DIAGNOSIS — I6300 Cerebral infarction due to thrombosis of unspecified precerebral artery: Secondary | ICD-10-CM | POA: Diagnosis not present

## 2020-12-31 DIAGNOSIS — Z01810 Encounter for preprocedural cardiovascular examination: Secondary | ICD-10-CM

## 2020-12-31 LAB — GLUCOSE, CAPILLARY
Glucose-Capillary: 105 mg/dL — ABNORMAL HIGH (ref 70–99)
Glucose-Capillary: 170 mg/dL — ABNORMAL HIGH (ref 70–99)

## 2020-12-31 MED ORDER — OXYCODONE HCL 5 MG PO TABS: 5.0000 mg | ORAL_TABLET | Freq: Four times a day (QID) | ORAL | Status: DC | PRN

## 2020-12-31 NOTE — Progress Notes (Addendum)
Patient's morning BP 145/101, patient refused PRN hydralazine. MD notified. Patient also refusing assessment.

## 2020-12-31 NOTE — Progress Notes (Signed)
Progress Note  Patient Name: [name] Date of Encounter: 12/31/2020  [facility] Cardiologist: Rollene Rotunda, MD   Subjective   The patient lays comfortably in flat position.   Inpatient Medications    Scheduled Meds: . (feeding supplement) PROSource Plus  30 mL Oral BID BM  . aspirin  81 mg Oral Daily  . atorvastatin  40 mg Oral Daily  . carvedilol  6.25 mg Oral BID WC  . clopidogrel  75 mg Oral Daily  . collagenase   Topical Daily  . divalproex  500 mg Oral Daily  . feeding supplement  237 mL Oral BID BM  . folic acid  1 mg Oral Daily  . furosemide  40 mg Oral Daily  . hydrALAZINE  25 mg Oral TID  . isosorbide mononitrate  30 mg Oral Daily  . levETIRAcetam  500 mg Oral BID  . magnesium oxide  400 mg Oral BID  . multivitamin with minerals  1 tablet Oral Daily  . polyethylene glycol  17 g Per Tube Daily  . sacubitril-valsartan  1 tablet Oral BID  . silver sulfADIAZINE   Topical BID  . spironolactone  12.5 mg Oral Daily  . thiamine  100 mg Oral Daily   Continuous Infusions:  PRN Meds: acetaminophen **OR** acetaminophen (TYLENOL) oral liquid 160 mg/5 mL **OR** acetaminophen, docusate, LORazepam **OR** LORazepam, LORazepam, ondansetron (ZOFRAN) IV, oxyCODONE, phenol   Vital Signs    Vitals:   12/30/20 0758 12/30/20 1154 12/30/20 2146 12/31/20 0734  BP: (!) 129/109 (!) 80/56 121/76 (!) 145/101  Pulse: 83 85 90 93  Resp: 18 18 18 18   Temp: 98.1 F (36.7 C)  97.8 F (36.6 C)   TempSrc: Oral  Oral   SpO2: 100% 98% 100% 100%  Weight:      Height:        Intake/Output Summary (Last 24 hours) at 12/31/2020 1258 Last data filed at 12/30/2020 2342 Gross per 24 hour  Intake 120 ml  Output 700 ml  Net -580 ml   Last 3 Weights 12/29/2020 12/28/2020 12/23/2020  Weight (lbs) 176 lb 12.9 oz 186 lb 15.2 oz 187 lb 9.8 oz  Weight (kg) 80.2 kg 84.8 kg 85.1 kg      Telemetry    Has been off of telemetry, removed his leads - Personally Reviewed  ECG    No new  since 12/17/20 - Personally Reviewed  Physical Exam   GEN: No acute distress.   Neck: No JVD Cardiac: RRR, no murmurs, rubs, or gallops.  Respiratory: Clear to auscultation bilaterally GI: Soft, nontender, non-distended  MS: No edema; No deformity. Neuro:  Nonfocal  Psych: Normal affect   Labs    High Sensitivity Troponin:   Recent Labs  Lab 12/17/20 1558 12/17/20 1800 12/18/20 1034  TROPONINIHS 46* 45* 42*      Chemistry Recent Labs  Lab 12/28/20 1437 12/29/20 0102 12/30/20 1734  NA 138 134* 137  K 4.3 4.1 4.2  CL 102 102 102  CO2 24 23 24   GLUCOSE 184* 184* 123*  BUN 22* 20 17  CREATININE 1.09 1.03 1.08  CALCIUM 9.1 9.2 9.3  PROT 7.4 7.4 8.0  ALBUMIN 2.8* 2.8* 3.1*  AST 27 24 26   ALT 36 34 31  ALKPHOS 81 74 78  BILITOT 0.5 0.9 0.5  GFRNONAA >60 >60 >60  ANIONGAP 12 9 11      Hematology Recent Labs  Lab 12/28/20 1437 12/29/20 0102 12/30/20 1734  WBC 5.4 5.2 6.3  RBC 4.88 4.73 4.88  HGB 15.0 14.7 15.6  HCT 46.5 45.3 46.5  MCV 95.3 95.8 95.3  MCH 30.7 31.1 32.0  MCHC 32.3 32.5 33.5  RDW 14.5 14.6 14.5  PLT 418* 411* 419*    BNPNo results for input(s): BNP, PROBNP in the last 168 hours.   DDimer No results for input(s): DDIMER in the last 168 hours.   Radiology    DG Foot 2 Views Left  Result Date: 12/30/2020 CLINICAL DATA:  Left great toe ulcer. EXAM: LEFT FOOT - 2 VIEW COMPARISON:  11/27/2020 FINDINGS: Focal soft tissue swelling and defect in the medial aspect of the great toe. There is an interval small area of bone destruction in the medial aspect of the base of the 1st distal phalanx. There are multiple tiny bone fragments at the medial aspect of the 1st IP joint. There is also a small amount of soft tissue gas in the swelling medial to the 1st IP joint. Moderate inferior calcaneal enthesophyte formation. IMPRESSION: 1. Small area of focal osteomyelitis involving the medial aspect of the base of the 1st distal phalanx. 2. Tiny amount of soft  tissue gas in the medial aspect of the great toe at the level of the 1st IP joint. This could be due to infection with a gas-forming organism or represent air introduced through the patient's area of ulceration. 3. Multiple tiny bone fragments at the medial aspect of the 1st IP joint, possibly related to chronic infection in that area or degenerative fragmentation. Electronically Signed   By: Beckie Salts M.D.   On: 12/30/2020 14:33   DG Foot Complete Right  Result Date: 12/30/2020 CLINICAL DATA:  Right great toe ulcer. EXAM: RIGHT FOOT COMPLETE - 3+ VIEW COMPARISON:  12/08/2020.  Right foot MR dated 12/08/2020. FINDINGS: Mild soft tissue irregularity in the medial aspect of the great toe. Multiple interval collections of gas in the great toe medially and laterally. No bone destruction or periosteal reaction. Moderate inferior calcaneal enthesophyte formation. IMPRESSION: Soft tissue ulceration and gas in the great toe without radiographic evidence of osteomyelitis. The gas is concerning for infection with a gas-forming organism. These results will be called to the ordering clinician or representative by the Radiologist Assistant, and communication documented in the PACS or [employer]. Electronically Signed   By: Beckie Salts M.D.   On: 12/30/2020 14:35    Cardiac Studies   Echo 12/18/20 1. Left ventricular ejection fraction, by estimation, is <20%. The left  ventricle has severely decreased function. The left ventricle demonstrates  global hypokinesis. The left ventricular internal cavity size was  moderately dilated. There is moderate  left ventricular hypertrophy. Left ventricular diastolic parameters are  consistent with Grade II diastolic dysfunction (pseudonormalization).  Elevated left atrial pressure.  2. Right ventricular systolic function is moderately reduced. The right  ventricular size is mildly enlarged.  3. Left atrial size was moderately dilated.  4. Right atrial size was  moderately dilated.  5. The mitral valve is normal in structure. Trivial mitral valve  regurgitation. No evidence of mitral stenosis.  6. The aortic valve is tricuspid. Aortic valve regurgitation is not  visualized. Mild aortic valve sclerosis is present, with no evidence of  aortic valve stenosis.  7. The inferior vena cava is normal in size with greater than 50%  respiratory variability, suggesting right atrial pressure of 3 mmHg.   ECHO: 09/26/2020 1. Left ventricular ejection fraction, by estimation, is 40 to 45%. The  left ventricle has mildly  decreased function. The left ventricle  demonstrates global hypokinesis. The left ventricular internal cavity size  was mildly dilated. There is mild concentric left ventricular hypertrophy. Left ventricular diastolic parameters are consistent with Grade II diastolic dysfunction (pseudonormalization).  2. Right ventricular systolic function is normal. The right ventricular  size is normal.  3. Left atrial size was moderately dilated.  4. Right atrial size was mildly dilated.  5. The mitral valve is normal in structure. Trivial mitral valve  regurgitation. No evidence of mitral stenosis.  6. The aortic valve is tricuspid. There is mild calcification of the  aortic valve. Aortic valve regurgitation is not visualized. Mild aortic  valve sclerosis is present, with no evidence of aortic valve stenosis.  7. The inferior vena cava is normal in size with greater than 50%  respiratory variability, suggesting right atrial pressure of 3 mmHg.   CARDIAC CT: 04/25/2020 iMPRESSION: 1. Coronary artery disease with moderate to severe luminal narrowing at the right coronary, LAD and circumflex. Consider functional assessment.  2. Additional findings.   FINDINGS:  Morphologic elevation of the heart:   The left atrium, right atrium are mildly enlarged. The right ventricle is not enlarged. The left ventricle is enlarged and the left  ventricular wall thickness measures 12mm. There is no evidence of filling defects within the left atrial appendage. All pulmonary veins drain into the left atrium. Tricuspid aortic valve with no calcifications. The mitral valve shows no calcifications. No abnormal pericardial effusion.   Evaluation of the thoracic aorta:   The ascending aorta measures 34 mm. The descending aorta measures 27 mm.   No acute aortic syndrome is identified.   Evaluation of the coronary arterial system:   Coronary Anatomy: Classic anatomy with right-sided dominance   1. Right coronary artery: Multiple calcified plaques are noted in the proximal mid and distal segments causing moderate to severe luminal narrowing.  2. Posterior descending artery:Nondiagnostic  3. Posterolateral branch:No significant stenosis.  4. Left main: Isolated calcifications causing mild luminal narrowing.  5. LAD: Multiple proximal mid and distal segments calcified plaques causing moderate to severe luminal narrowing.  6. Left circumflex:Multiple calcifications causing moderate to severeluminal narrowing.  Non-cardiac findings:   CARDIAC CATH: 10/07/2019 Impressions:   Diffuse atherosclerosis throughout all coronary distributions  Worst lesion is a 60-70% of the prox LCx  Degree of CM is greater than degree of CAD, suggesting a non-ischemic etiology   Patient Profile     55 y.o. male with PMH chronic systolic heart failure, CAD, polysubstance abuse who presented with CVA. We are following for management of heart failure at the request of Dr. Jerral Ralph.  Assessment & Plan    Preoperative cardiovascular risk:  - based on patients co morbidities - CHF and CAD he is considered moderate risk for an intermediate risk surgery (toe amputation), however is is currently euvolemic, with no signs of fluid overload and no angina. He is in SR.  If needed ASA can be held prior and post surgery but Plavix should be continued given recent  stroke and underlying CAD.   CVA, R superior cerebellar cutoff on imaging consistent with acute ischemic stroke Additional areas of subacute infarct also noted -management per neurology and primary team  Chronic systolic heart failure -most recent EF <20%.  -prior EF has been as low as 15% with recovery to 40-45% -I personally reviewed his most recent echo. No evidence of LV thrombus, echo contrast used. -continue medical management with carvedilol, furosemide, imdur, hydralazine, sacubitril-valsartan, spironolactone -I am  concerned about long term adherence to medications, especially BID and TID medications. 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 Blanchie Dessert initial recommendation for cath as an outpatient at least 1 month after recovery from stroke -continue DAPT per neuro, then continue aspirin and atorvastatin  Please call us with any questions.   For questions or updates, please contact [facility] Please consult [email or web address] for contact info under     Signed, Tobias Alexander, MD  12/31/2020, 12:58 PM

## 2020-12-31 NOTE — Progress Notes (Signed)
Physical Therapy Treatment Patient Details Name: [name] MRN: [id number] DOB: 1966-04-04 Today's Date: 12/31/2020    History of Present Illness Homeless 55 yo male found down with c/o CP by bystanders. 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 Extubated 1/20.    PT Comments    Pt progressing from mobility stand point and was able to amb 30' this date with RW and modAx2 for safety/chair follow. Pt remains ataxic, with decreased insight to deficits and safety, easily irritated with increased falls risk, and unsafe to return home/to the streets. Session limited today due to pt irritated about therapist "rushing" him despite education on the purpose of therapy and how we are there to help get better. Recommend SNF upon d/c to achieve safe mod I level of function.    Follow Up Recommendations  SNF;Supervision/Assistance - 24 hour     Equipment Recommendations  Other (comment) (TBA)    Recommendations for Other Services       Precautions / Restrictions Precautions Precautions: Fall Precaution Comments: pt easily agitated due to psych h/o, is very unsafe Restrictions Weight Bearing Restrictions: No    Mobility  Bed Mobility Overal bed mobility: Needs Assistance Bed Mobility: Supine to Sit;Sit to Supine Rolling: Modified independent (Device/Increase time)   Supine to sit: Min guard Sit to supine: Min guard   General bed mobility comments: min guard for safety due to impulsivity, HOB flat, use of bed rail  Transfers Overall transfer level: Needs assistance Equipment used: Rolling walker (2 wheeled) Transfers: Sit to/from [employer] Sit to Stand: +2 physical assistance;Mod assist;+2 safety/equipment;Min assist Stand pivot transfers: Mod assist;+2 physical assistance;+2  safety/equipment;Min assist       General transfer comment: pt impulsive and non-compliant with recommend hand placement or placement of feet. Pt quick to move  Ambulation/Gait Ambulation/Gait assistance: Mod assist;+2 physical assistance;+2 safety/equipment Gait Distance (Feet): 30 Feet Assistive device: Rolling walker (2 wheeled) Gait Pattern/deviations: Step-through pattern;Decreased weight shift to left;Decreased step length - right;Decreased step length - left;Decreased stride length;Ataxic;Trunk flexed Gait velocity: reduced Gait velocity interpretation: <1.8 ft/sec, indicate of risk for recurrent falls General Gait Details: pt pushes walker to far out in front of self however pt resistant and irritant if help/advice offered, pt with ataxic gait pattern with R LE and L knee hyperextension   Stairs             Wheelchair Mobility    Modified Rankin (Stroke Patients Only) Modified Rankin (Stroke Patients Only) Pre-Morbid Rankin Score: No symptoms Modified Rankin: Moderately severe disability     Balance Overall balance assessment: Needs assistance Sitting-balance support: Feet supported;Bilateral upper extremity supported Sitting balance-Leahy Scale: Poor Sitting balance - Comments: pt requiring bilat UE support but minimal lateral sway Postural control: Posterior lean;Right lateral lean Standing balance support: Bilateral upper extremity supported Standing balance-Leahy Scale: Poor Standing balance comment: reliant on BUE and external support                            Cognition Arousal/Alertness: Awake/alert (moments of lethargy and moments of wakefulness) Behavior During Therapy: Impulsive (easily irritated about you "rushing him") Overall Cognitive Status: Impaired/Different from baseline Area of Impairment: Attention;Following commands;Safety/judgement;Awareness;Problem solving;Memory                   Current  Attention Level:  Sustained Memory: Decreased short-term memory Following Commands: Follows one step commands with increased time;Follows one step commands inconsistently Safety/Judgement: Decreased awareness of safety;Decreased awareness of deficits Awareness: Emergent Problem Solving: Slow processing;Decreased initiation;Requires verbal cues;Difficulty sequencing;Requires tactile cues General Comments: pt very impulsive, getting up on own in room despite near falling, pt easily irritated if you ask him more than 1 time to do something stating "you're rushing me. ain't no one going to rush me. I want a new nurse."      Exercises      General Comments General comments (skin integrity, edema, etc.): pt with noted ulcers on bilat great toes      Pertinent Vitals/Pain Pain Assessment: Faces Faces Pain Scale: No hurt    Home Living                      Prior Function            PT Goals (current goals can now be found in the care plan section) Acute Rehab PT Goals PT Goal Formulation: With patient Time For Goal Achievement: 01/03/21 Potential to Achieve Goals: Good Progress towards PT goals: Progressing toward goals    Frequency    Min 3X/week      PT Plan Current plan remains appropriate    Co-evaluation              AM-PAC PT "6 Clicks" Mobility   Outcome Measure  Help needed turning from your back to your side while in a flat bed without using bedrails?: A Little Help needed moving from lying on your back to sitting on the side of a flat bed without using bedrails?: A Little Help needed moving to and from a bed to a chair (including a wheelchair)?: A Lot Help needed standing up from a chair using your arms (e.g., wheelchair or bedside chair)?: A Lot Help needed to walk in hospital room?: A Lot Help needed climbing 3-5 steps with a railing? : A Lot 6 Click Score: 14    End of Session Equipment Utilized During Treatment: Gait belt Activity Tolerance: Patient  tolerated treatment well;Treatment limited secondary to agitation Patient left: with call bell/phone within reach;in bed;with bed alarm set (sitter reported no need for chair alarm) Nurse Communication: Mobility status PT Visit Diagnosis: Unsteadiness on feet (R26.81);Other symptoms and signs involving the nervous system (R29.898);Other abnormalities of gait and mobility (R26.89);Ataxic gait (R26.0);Difficulty in walking, not elsewhere classified (R26.2)     Time: 3382-5053 PT Time Calculation (min) (ACUTE ONLY): 16 min  Charges:  $Gait Training: 8-22 mins                     Lewis Shock, PT, DPT Acute Rehabilitation Services Pager #: [phone] Office #: [phone]    Iona Hansen 12/31/2020, 11:10 AM

## 2020-12-31 NOTE — Progress Notes (Signed)
PROGRESS NOTE    [name]  ZOX:[id number] DOB: 05/31/1966 DOA: 12/17/2020 PCP: [name]   Brief Narrative:  The patient is a 55 year old gentleman, initially with unknown identity brought to the ER after he complained of chest pain and became unresponsive with left-sided weakness and left gaze deviation. Was intubated in the ER. Apparently seen by bystanders when patient complained of chest pain and all of a sudden he became unresponsive. 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 1/17-1/20, admitted to ICU, extubated to 4 L oxygen on 1/20. 1/21, transferred to [facility] service, progressive bed. Patient has different MRN in our hospital system, he has frequent hospitalization and was recently admitted and discharged a week ago with diabetic toe ulcer. Review his previous records indicate that he is homeless, he is noncompliant, uses cocaine. He has a history of COPD, bipolar 1 disorder, known chronic heart failure with recent ejection fraction 40%, ischemic heart disease and previously declined PCI.  Significant diagnostics: 1/17 CT head: Left cerebellar hypodensity concerning for acute/subacute infarct. No intracranial hemorrhage. Small right parietal scalp hematoma 1/17 CTA head and neck: Non opacification of the left vertebral artery with retrograde filling of the distal left V4 segment. Bilateral carotid bifurcation atheromatous disease with 50% right and 30% left carotid bulb narrowing. Mild left P2 segment narrowing. 2D echo 1/18>>>EF <20%, hypokinesis, mod LVH, grade 2 diastolic dysfunction,  EEG 1/19>>This studyshowed evidence of epileptogenicity arising fromright>leftfrontal region. There is also moderatediffuse encephalopathy, nonspecific etiology butcould be secondary tosedation.No seizures were seen throughout the  recording.  Because the patient continued to have worsening neurological symptoms today became unresponsive a stat head CT was obtained again and he underwent EEG monitoring.  Code stroke was called today by the nursing.  EEG was within normal limits.  Patient did admit to having some suicidal ideation so he was placed on a one-to-one sitter and psychiatry is consulted for further evaluation.  Psych evaluated and deemed him not to be a risk and did not feel as if he was a risk to himself or others. Neurology evaluated and recommended ASA 81 mg Daily and Plavix 75 mg for 3 weeks and then ASA alone. Repeat MRI showed Acute Infarct involving the Right Superior Cerebellar and Right Middle Cerebellar Peduncle. They recommend Keppra 500 mg po BID   Palliative is consulted and patient does not want any of his children to be informed but wanted to his friend Alac to be notified.  He was placed on CIWA Protocol.  Continues to be agitated and continues to intermittently refuse his care and refused medications.  Was complaining of some foot pain will consult podiatry for further evaluation given his wounds.  Podiatry consulted and ordering X-Rays of the Foot and Non-Invasive Vascular Studies X-ray findings as below. Podiatry planning on Surgical Intervention tomorrow or Wednesday pending Cardiac Clearance. Cardiology re-consulted for Pre-op Clearance and he is at moderate risk for an intermediate risk surgery  Assessment & Plan:   Active Problems:   Stroke 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 They recommend Keppra 500 mg po BID"  -MRA of the  head, acute occlusion of the  right cerebellar artery  -LDL 72, start Lipitor 40.  Liver function tests normalized. -A1c 6.4 -EEG with diffuse encephalopathy -Patient was treated with heparin.  Now on aspirin and Plavix for 3 weeks then aspirin alone. 1/25, patient complained of persistent right-sided weakness that he claims worsening,  -He had a repeat head CT yesterday and again had some neurological deficits today still repeat head code stroke was called today. -Underwent a repeat MRI and EEG done and showed "This study is within normal limits. No seizures or epileptiform discharges were seen throughout the recording." -Neurology recommending DAPT for 3 weeks then ASA alone; per Cardiology if needed ASA can be held prior to and Post Surgery but Plavix should be continued given recent CVA and Underlying CAD -palliative care consulted for goals of care discussion  Acute on chronic systolic and diastolic congestive heart failure CAD -Recent echocardiogram with ejection fraction 40% -New echocardiogram ejection fraction less than 20%. -On aspirin, Lipitor, Entresto, aldactone, coreg and Imdur and oral lasix.  -Cardiology reconsulted and they are concerned about long term adherence to medications especially BID and TID Meds,  -Currently Euvolemic  -Kidney functions are stable. -Patient is noncompliant and will be difficult to treat. -Strict I's and O's; He is - 9.5429 liters  -Continue to Monitor closely -Cardiology recommending Cath as an outpatient at least 1 month after recovery from CVA   Questionable seizure activity:  -No more seizure activities.  Neurology recommended no AEDs at this time but then he became altered again this afternoon so neurology repeated a stat EEG and placed him on low-dose Keppra 500 twice daily. -He has impulsiveness and some tremors, unlikely seizure.  Acute Hypoxemic Respiratory Failure:  -Secondary to stroke.  Currently on room air. -SpO2: 100 % O2 Flow Rate (L/min): 4 L/min FiO2  (%): 30 % -Continue monitor respiratory status carefully and if necessary will place back on supplemental oxygen via nasal cannula  Acute metabolic toxic encephalopathy:  -Multifactorial.  Stroke, drug use, acute respiratory failure and sedation.  Stabilizing. Patient has baseline behavior problems and intermittent agitations, aggressiveness towards the staff.  He endorsed some psychotic symptoms.  Seen by psychiatry and cleared for any lethality. -We will place on a CIWA protocol given his history of alcohol abuse continue monitor given that he has been significantly agitated -Will need placement to skilled nursing facility and he is not only able to be placed.  Acute liver failure: -Improved.  Probably due to ischemic liver. -AST is 26 and ALT is 31 yesterday evening  -Continue to Monitor and Trend   Hyperphosphatemia -Mild as Phos Level is 4.2 yesterday and repeat this AM is pending as he has refused labs -Continue to Monitor and Trend -Repeat Phos Level in the AM   Cocaine use:  -Counseled to quit.  Hyponatremia -Mild at 134 the day before yesterday and is now 137 yesterday  -Continue to monitor and trend repeat CMP in a.m.  Bipolar disorder/homelessness: Poor compliance. Homelessness.  Consulted psychiatry.    Admits to having some suicidal ideation so we will place him on a one-to-one sitter but Psych evaluated and cleared him a danger to himself  Deconditioning  -Continue mobilizing.  Referred to SNF. -Patient is throwing away his telemetry and taking off his IV lines, discontinue telemetry.  Transferred to MedSurg bed. -Transfer to skilled nursing facility if available per social work placement will be extremely difficult.  Cannot safely discharge the patient home as he lives by himself -  Worked with PT and they documented: "Pt progressing from mobility stand point and was able to amb 30' this date with RW and modAx2 for safety/chair follow. Pt remains ataxic, with  decreased insight to deficits and safety, easily irritated with increased falls risk, and unsafe to return home/to the streets. Session limited today due to pt irritated about therapist "rushing" him despite education on the purpose of therapy and how we are there to help get better."    Bilateral Feet Hallux ulcers -Initially thought to be Probably ischemic leg ulcers.  Local wound care.   -With acute a stroke, behavioral problems, ? surgical candidate at this time. -Will consider ABI and X-Rays but will defer to Podiatry;Podiatry has obtaining Bilateral Foot X-Rays and have ordered noninvasive vascular studies -Left Foot X-Ray showed "Small area of focal osteomyelitis involving the medial aspect of the base of the 1st distal phalanx. Tiny amount of soft tissue gas in the medial aspect of the great toe at the level of the 1st IP joint. This could be due to infection with a gas-forming organism or represent air introduced through the patient's area of ulceration. Multiple tiny bone fragments at the medial aspect of the 1st IP joint, possibly related to chronic infection in that area or degenerative fragmentation. " -Right Foot X-Ray showed "Soft tissue ulceration and gas in the great toe without radiographic evidence of osteomyelitis. The gas is concerning for infection with a gas-forming organism." -Currently he is Afebrile and has No Leukocytosis so there is no real indication for antibiotics at this time but will wait for possible debridement and then obtain cultures intraoperatively for targeted therapy -Podiatry is recommending weightbearing in surgical shoes bilaterally and they have been ordered next-wound care as recommended by Santyl wet-to-dry and this is to be applied daily -Podiatry recommending surgical intervention and Cardiology cleared the patient but feel he is at moderate risk for an intermediate risk Surgery.  -Pain control adjusted and changed Tramadol to Oxycodone  IR  Non-Compliance -Has been intermittently agitated and non-compliant with Care and continues to refuse Vital Signs, Medication Administration, Assessments,   DVT prophylaxis: SCDs Code Status: FULL CODE Family Communication: No family present at bedside Disposition Plan: SNF when able as he is not a safe discharge disposition for home given his behavioral issues and because of his significant weakness and now Bilateral Hallux Ulcers that need Surgical Intervention   Status is: Inpatient  Remains inpatient appropriate because:Unsafe d/c plan, IV treatments appropriate due to intensity of illness or inability to take PO and Inpatient level of care appropriate due to severity of illness   Dispo: The patient is from: Home              Anticipated d/c is to: SNF              Anticipated d/c date is: 3 days              Patient currently is not medically stable to d/c.   Difficult to place patient Yes  Consultants:   Neurology  PCCM transfer  Cardiology  Palliative care Medicine  Will discuss with podiatry and ask for a formal Consult   Procedures: See above  Repeat EEG 12/26/2020: This study is within normal limits. No seizures or epileptiform discharges were seen throughout the recording.  Antimicrobials:  Anti-infectives (From admission, onward)   Start     Dose/Rate Route Frequency Ordered Stop   12/17/20 1450  ceFAZolin (ANCEF) 2-4 GM/100ML-% IVPB  Note to Pharmacy: Cristy Friedlanderusterman, Kyle   : cabinet override      12/17/20 1450 12/17/20 1911        Subjective: Seen and examined at bedside and he was extremely upset and was cursing this morning and wanted to go home.  He states that his pain medication was not holding his pain adequately.  Upset and wanted to go home.  I discussed with him the importance of him improving because he lives by himself and also because he has ulcers appear infected and he understands.  He got concerned and podiatry to reevaluate.  He denies  any chest pain or shortness of breath.  No other concerns or close at this time but has been refusing care intermittently and refused vital signs and medication administration this morning.  Objective: Vitals:   12/30/20 0758 12/30/20 1154 12/30/20 2146 12/31/20 0734  BP: (!) 129/109 (!) 80/56 121/76 (!) 145/101  Pulse: 83 85 90 93  Resp: 18 18 18 18   Temp: 98.1 F (36.7 C)  97.8 F (36.6 C)   TempSrc: Oral  Oral   SpO2: 100% 98% 100% 100%  Weight:      Height:        Intake/Output Summary (Last 24 hours) at 12/31/2020 1256 Last data filed at 12/30/2020 2342 Gross per 24 hour  Intake 120 ml  Output 700 ml  Net -580 ml   Filed Weights   12/23/20 0500 12/28/20 0517 12/29/20 0526  Weight: 85.1 kg 84.8 kg 80.2 kg   Examination: Physical Exam:  Constitutional: WN/WD overweight African-American male who was very upset and agitated today.   Eyes: Lids and conjunctivae normal, sclerae anicteric  ENMT: External Ears, Nose appear normal. Grossly normal hearing.  Neck: Appears normal, supple, no cervical masses, normal ROM, no appreciable thyromegaly; no JVD Respiratory: Diminished to auscultation bilaterally, no wheezing, rales, rhonchi or crackles. Normal respiratory effort and patient is not tachypenic. No accessory muscle use.  Unlabored breathing Cardiovascular: RRR, no murmurs / rubs / gallops. S1 and S2 auscultated.  Trace extremity edema Abdomen: Soft, non-tender, non-distended.  Bowel sounds positive.  GU: Deferred. Musculoskeletal: No clubbing / cyanosis of digits/nails.  Has bilateral total hallux ulcers Skin: No rashes, lesions, ulcers. No induration; Warm and dry.  Neurologic: CN 2-12 grossly intact with no focal deficits.  Romberg sign and cerebellar reflexes not assessed.  Psychiatric: Normal judgment and insight. Alert and oriented x 3.  Extremely agitated mood and was upset.  Data Reviewed: I have personally reviewed following labs and imaging studies  CBC: Recent  Labs  Lab 12/26/20 0848 12/27/20 0304 12/28/20 0534 12/28/20 1437 12/29/20 0102 12/30/20 1734  WBC 5.3 6.0 6.2 5.4 5.2 6.3  NEUTROABS 2.4 2.2 2.4  --  1.7 2.5  HGB 14.1 15.1 15.1 15.0 14.7 15.6  HCT 43.6 46.5 44.9 46.5 45.3 46.5  MCV 95.4 95.5 94.1 95.3 95.8 95.3  PLT 374 394 414* 418* 411* 419*   Basic Metabolic Panel: Recent Labs  Lab 12/27/20 0304 12/28/20 0534 12/28/20 1437 12/29/20 0102 12/30/20 1734  NA 136 136 138 134* 137  K 4.5 4.6 4.3 4.1 4.2  CL 103 101 102 102 102  CO2 22 23 24 23 24   GLUCOSE 113* 111* 184* 184* 123*  BUN 21* 22* 22* 20 17  CREATININE 1.10 1.02 1.09 1.03 1.08  CALCIUM 9.2 9.3 9.1 9.2 9.3  MG 2.0 2.3 2.1 2.0 2.1  PHOS 4.7* 4.5 4.8* 4.7* 4.2   GFR: Estimated Creatinine Clearance: 80  mL/min (by C-G formula based on SCr of 1.08 mg/dL). Liver Function Tests: Recent Labs  Lab 12/27/20 0304 12/28/20 0534 12/28/20 1437 12/29/20 0102 12/30/20 1734  AST 23 29 27 24 26   ALT 42 38 36 34 31  ALKPHOS 77 78 81 74 78  BILITOT 0.2* 0.7 0.5 0.9 0.5  PROT 7.5 7.7 7.4 7.4 8.0  ALBUMIN 2.8* 2.9* 2.8* 2.8* 3.1*   No results for input(s): LIPASE, AMYLASE in the last 168 hours. No results for input(s): AMMONIA in the last 168 hours. Coagulation Profile: No results for input(s): INR, PROTIME in the last 168 hours. Cardiac Enzymes: No results for input(s): CKTOTAL, CKMB, CKMBINDEX, TROPONINI in the last 168 hours. BNP (last 3 results) No results for input(s): PROBNP in the last 8760 hours. HbA1C: No results for input(s): HGBA1C in the last 72 hours. CBG: Recent Labs  Lab 12/27/20 1632 12/27/20 2324 12/28/20 2001 12/30/20 1148 12/31/20 0736  GLUCAP 185* 167* 152* 165* 105*   Lipid Profile: No results for input(s): CHOL, HDL, LDLCALC, TRIG, CHOLHDL, LDLDIRECT in the last 72 hours. Thyroid Function Tests: No results for input(s): TSH, T4TOTAL, FREET4, T3FREE, THYROIDAB in the last 72 hours. Anemia Panel: No results for input(s): VITAMINB12,  FOLATE, FERRITIN, TIBC, IRON, RETICCTPCT in the last 72 hours. Sepsis Labs: No results for input(s): PROCALCITON, LATICACIDVEN in the last 168 hours.  No results found for this or any previous visit (from the past 240 hour(s)).  RN Pressure Injury Documentation:     Estimated body mass index is 26.11 kg/m as calculated from the following:   Height as of this encounter: 5\' 9"  (1.753 m).   Weight as of this encounter: 80.2 kg.  Malnutrition Type:  Nutrition Problem: Inadequate oral intake Etiology: inability to eat  Malnutrition Characteristics:  Signs/Symptoms: NPO status   Nutrition Interventions:  Interventions: Prostat,MVI,Ensure Enlive (each supplement provides 350kcal and 20 grams of protein)   Radiology Studies: DG Foot 2 Views Left  Result Date: 12/30/2020 CLINICAL DATA:  Left great toe ulcer. EXAM: LEFT FOOT - 2 VIEW COMPARISON:  11/27/2020 FINDINGS: Focal soft tissue swelling and defect in the medial aspect of the great toe. There is an interval small area of bone destruction in the medial aspect of the base of the 1st distal phalanx. There are multiple tiny bone fragments at the medial aspect of the 1st IP joint. There is also a small amount of soft tissue gas in the swelling medial to the 1st IP joint. Moderate inferior calcaneal enthesophyte formation. IMPRESSION: 1. Small area of focal osteomyelitis involving the medial aspect of the base of the 1st distal phalanx. 2. Tiny amount of soft tissue gas in the medial aspect of the great toe at the level of the 1st IP joint. This could be due to infection with a gas-forming organism or represent air introduced through the patient's area of ulceration. 3. Multiple tiny bone fragments at the medial aspect of the 1st IP joint, possibly related to chronic infection in that area or degenerative fragmentation. Electronically Signed   By: 01/01/2021 M.D.   On: 12/30/2020 14:33   DG Foot Complete Right  Result Date:  12/30/2020 CLINICAL DATA:  Right great toe ulcer. EXAM: RIGHT FOOT COMPLETE - 3+ VIEW COMPARISON:  12/08/2020.  Right foot MR dated 12/08/2020. FINDINGS: Mild soft tissue irregularity in the medial aspect of the great toe. Multiple interval collections of gas in the great toe medially and laterally. No bone destruction or periosteal reaction. Moderate inferior calcaneal  enthesophyte formation. IMPRESSION: Soft tissue ulceration and gas in the great toe without radiographic evidence of osteomyelitis. The gas is concerning for infection with a gas-forming organism. These results will be called to the ordering clinician or representative by the Radiologist Assistant, and communication documented in the PACS or [employer]. Electronically Signed   By: Beckie Salts M.D.   On: 12/30/2020 14:35   Scheduled Meds: . (feeding supplement) PROSource Plus  30 mL Oral BID BM  . aspirin  81 mg Oral Daily  . atorvastatin  40 mg Oral Daily  . carvedilol  6.25 mg Oral BID WC  . clopidogrel  75 mg Oral Daily  . collagenase   Topical Daily  . divalproex  500 mg Oral Daily  . feeding supplement  237 mL Oral BID BM  . folic acid  1 mg Oral Daily  . furosemide  40 mg Oral Daily  . hydrALAZINE  25 mg Oral TID  . isosorbide mononitrate  30 mg Oral Daily  . levETIRAcetam  500 mg Oral BID  . magnesium oxide  400 mg Oral BID  . multivitamin with minerals  1 tablet Oral Daily  . polyethylene glycol  17 g Per Tube Daily  . sacubitril-valsartan  1 tablet Oral BID  . silver sulfADIAZINE   Topical BID  . spironolactone  12.5 mg Oral Daily  . thiamine  100 mg Oral Daily   Continuous Infusions:   LOS: 14 days   Merlene Laughter, DO [facility] PAGER is on AMION  If 7PM-7AM, please contact night-coverage [email or web address]

## 2020-12-31 NOTE — Progress Notes (Signed)
Patient refusing assessment, patient educated on the importance of assessment, continues to refuse.

## 2020-12-31 NOTE — Progress Notes (Signed)
ABI's have been completed. Preliminary results can be found in CV Proc through chart review.   12/31/20 2:45 PM Olen Cordial RVT

## 2020-12-31 NOTE — Progress Notes (Signed)
Patient has refused vital signs and CBG checks around shift change. Nurse tech was able to get vitals after about 15 minutes.   Will continue to monitor patient and try to give nightly meds soon.

## 2021-01-01 DIAGNOSIS — I5043 Acute on chronic combined systolic (congestive) and diastolic (congestive) heart failure: Secondary | ICD-10-CM | POA: Diagnosis not present

## 2021-01-01 DIAGNOSIS — I63441 Cerebral infarction due to embolism of right cerebellar artery: Secondary | ICD-10-CM | POA: Diagnosis not present

## 2021-01-01 DIAGNOSIS — I6300 Cerebral infarction due to thrombosis of unspecified precerebral artery: Secondary | ICD-10-CM | POA: Diagnosis not present

## 2021-01-01 DIAGNOSIS — M86172 Other acute osteomyelitis, left ankle and foot: Secondary | ICD-10-CM

## 2021-01-01 DIAGNOSIS — L97513 Non-pressure chronic ulcer of other part of right foot with necrosis of muscle: Secondary | ICD-10-CM

## 2021-01-01 DIAGNOSIS — R569 Unspecified convulsions: Secondary | ICD-10-CM | POA: Diagnosis not present

## 2021-01-01 DIAGNOSIS — L97524 Non-pressure chronic ulcer of other part of left foot with necrosis of bone: Secondary | ICD-10-CM

## 2021-01-01 LAB — GLUCOSE, CAPILLARY
Glucose-Capillary: 119 mg/dL — ABNORMAL HIGH (ref 70–99)
Glucose-Capillary: 157 mg/dL — ABNORMAL HIGH (ref 70–99)

## 2021-01-01 LAB — SURGICAL PCR SCREEN
MRSA, PCR: NEGATIVE
Staphylococcus aureus: NEGATIVE

## 2021-01-01 MED ORDER — OXYCODONE HCL 5 MG PO TABS
5.0000 mg | ORAL_TABLET | Freq: Four times a day (QID) | ORAL | Status: DC | PRN
  Administered 2021-01-01: 10 mg via ORAL
  Administered 2021-01-01 – 2021-01-02 (×2): 5 mg via ORAL
  Administered 2021-01-02 (×2): 10 mg via ORAL
  Filled 2021-01-01 (×2): qty 2
  Filled 2021-01-01 (×2): qty 1
  Filled 2021-01-01: qty 2

## 2021-01-01 MED ORDER — HALOPERIDOL LACTATE 5 MG/ML IJ SOLN: 2.0000 mg | Freq: Once | INTRAMUSCULAR | Status: DC

## 2021-01-01 NOTE — Consult Note (Signed)
[facility] Face-to-Face Psychiatry Consult   Reason for Consult: Depression, bipolar, suicidal ideation Referring Physician: Dr. Jerral Ralph Patient Identification: Rynell Ciotti MRN:  [id number] Principal Diagnosis: <principal problem not specified> Diagnosis:  Active Problems:   Stroke [facility])   Basilar artery embolus   Acute on chronic combined systolic and diastolic CHF (congestive heart failure) (HCC)   Respiratory failure (HCC)   Total Time spent with patient: 30 minutes  Subjective:   Seanpaul Preece is a 55 y.o. male patient admitted with left cerebellar infarct, code stoke. Avante Carneiro is a 55 y.o. male was seen and evaluated face-to-face.  He is awake, alert and oriented, however is very agitated, irritable and remains very defensive.  Writer introduced self and reason for presenting, to include psychological evaluation and determine capacity.  Patient at that time advises" I am not trying to go home.  The doctor ordered my medication, and I want my medication.  The nurses here upset me, they come to work to be cute but do not want to work.  They can give me my medication, get my vital signs, and leave me the fuck alone. "  Patient begins to endorse derogatory names, in which he is redirected.  Patient has agreed to remain in the hospital to continue to receive the care he needs.  He is able to state why he remains in the hospital, and the importance of rehab.  However he continues to ruminate about his nurses, and withholding his pain medication.  He denies any suicidal thoughts, homicidal thoughts, and or auditory visual hallucination.  He did request my assistance with placing his lunch tray order.  Writer provided the phone number off of the menu, and observe patient down number and place order unassisted.  He was able to accurately identify his self by name, date of birth, and provide his location.  Patient is encouraged to cooperate with nursing staff, and attempt to be less defensive and  irritable.    HPI:  55 year old gentleman, initially with unknown identity brought to the ER after he complained of chest pain and became unresponsive with left-sided weakness and left gaze deviation. Was intubated in the ER. Apparently seen by bystanders when patient complained of chest pain and all of a sudden he became unresponsive. 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 1/17-1/20, admitted to ICU, extubated to 4 L oxygen on 1/20. 1/21, transferred to [facility] service, progressive bed. Patient has different MRN in our hospital system, he has frequent hospitalization and was recently admitted and discharged a week ago with diabetic toe ulcer. Review his previous records indicate that he is homeless, he is noncompliant, uses cocaine. He has a history of COPD, bipolar 1 disorder, known chronic heart failure with recent ejection fraction 40%, ischemic heart disease and previously declined PCI.  Past Psychiatric History: Depression, bipolar disorder, and substance abuse.  Patient was previously receiving behavioral health services from [place], however has been difficult to follow as he is homeless, primarily noncompliant, and has been unable to follow-up due to multiple inpatient hospitalizations.  Patient denies any recent suicidal thoughts, gestures, and or ideations.  Patient endorses significant cocaine use disorder, to include day of admission.  Patient with multiple inpatient hospitalizations.   Risk to Self:  Denies Risk to Others:  Denies Prior Inpatient Therapy:  Multiple Prior Outpatient Therapy:  Previously followed by [facility], however has been difficult to treat  and follow as he requires frequent inpatient medical admissions.  Past Medical History: History reviewed. No pertinent past medical history.  Past Surgical History:  Procedure Laterality Date   . IR ANGIO INTRA EXTRACRAN SEL COM CAROTID INNOMINATE BILAT MOD SED  12/17/2020  . IR ANGIO VERTEBRAL SEL VERTEBRAL BILAT MOD SED  12/17/2020  . RADIOLOGY WITH ANESTHESIA N/A 12/17/2020   Procedure: IR WITH ANESTHESIA;  Surgeon: Radiologist, Medication, MD;  Location: [facility] OR;  Service: Radiology;  Laterality: N/A;   Family History: History reviewed. No pertinent family history. Family Psychiatric  History: Mother with history of bipolar depression, and suicidality. Social History:  Social History   Substance and Sexual Activity  Alcohol Use Yes     Social History   Substance and Sexual Activity  Drug Use Yes  . Types: "Crack" cocaine    Social History   Socioeconomic History  . Marital status: Single    Spouse name: Not on file  . Number of children: 4  . Years of education: Not on file  . Highest education level: Not on file  Occupational History  . Not on file  Tobacco Use  . Smoking status: Current Every Day Smoker    Types: Cigarettes  . Smokeless tobacco: Never Used  Vaping Use  . Vaping Use: Never used  Substance and Sexual Activity  . Alcohol use: Yes  . Drug use: Yes    Types: "Crack" cocaine  . Sexual activity: Not Currently    Partners: Female  Other Topics Concern  . Not on file  Social History Narrative  . Not on file   Social Determinants of Health   Financial Resource Strain: Not on file  Food Insecurity: Not on file  Transportation Needs: Not on file  Physical Activity: Not on file  Stress: Not on file  Social Connections: Not on file   Additional Social History:    Allergies:   Allergies  Allergen Reactions  . Ibuprofen Anaphylaxis  . Latex Other (See Comments)    Reaction??  . Peanut-Containing Drug Products Other (See Comments)    Allergic to peanut butter (reaction??)  . Tylenol [Acetaminophen] Hives and Nausea And Vomiting    Per pt he can't have it because it will kill him.    Labs:  Results for orders placed or performed  during the hospital encounter of 12/17/20 (from the past 48 hour(s))  CBC with Differential/Platelet     Status: Abnormal   Collection Time: 12/30/20  5:34 PM  Result Value Ref Range   WBC 6.3 4.0 - 10.5 K/uL   RBC 4.88 4.22 - 5.81 MIL/uL   Hemoglobin 15.6 13.0 - 17.0 g/dL   HCT 16.6 06.3 - 01.6 %   MCV 95.3 80.0 - 100.0 fL   MCH 32.0 26.0 - 34.0 pg   MCHC 33.5 30.0 - 36.0 g/dL   RDW 01.0 93.2 - 35.5 %   Platelets 419 (H) 150 - 400 K/uL   nRBC 0.0 0.0 - 0.2 %   Neutrophils Relative % 39 %   Neutro Abs 2.5 1.7 - 7.7 K/uL   Lymphocytes Relative 39 %   Lymphs Abs 2.5 0.7 - 4.0 K/uL   Monocytes Relative 17 %   Monocytes Absolute 1.1 (H) 0.1 - 1.0 K/uL   Eosinophils Relative 4 %   Eosinophils Absolute 0.2 0.0 - 0.5 K/uL   Basophils Relative 1 %   Basophils Absolute 0.1 0.0 - 0.1 K/uL   Immature Granulocytes 0 %   Abs Immature  Granulocytes 0.01 0.00 - 0.07 K/uL    Comment: Performed at [facility], [address], [place], [name] [address]  Comprehensive metabolic panel     Status: Abnormal   Collection Time: 12/30/20  5:34 PM  Result Value Ref Range   Sodium 137 135 - 145 mmol/L   Potassium 4.2 3.5 - 5.1 mmol/L   Chloride 102 98 - 111 mmol/L   CO2 24 22 - 32 mmol/L   Glucose, Bld 123 (H) 70 - 99 mg/dL    Comment: Glucose reference range applies only to samples taken after fasting for at least 8 hours.   BUN 17 6 - 20 mg/dL   Creatinine, Ser 6.04 0.61 - 1.24 mg/dL   Calcium 9.3 8.9 - 54.0 mg/dL   Total Protein 8.0 6.5 - 8.1 g/dL   Albumin 3.1 (L) 3.5 - 5.0 g/dL   AST 26 15 - 41 U/L   ALT 31 0 - 44 U/L   Alkaline Phosphatase 78 38 - 126 U/L   Total Bilirubin 0.5 0.3 - 1.2 mg/dL   GFR, Estimated >98 >11 mL/min    Comment: (NOTE) Calculated using the CKD-EPI Creatinine Equation (2021)    Anion gap 11 5 - 15    Comment: Performed at [facility], [address], [place], [name] [address]  Phosphorus     Status: None   Collection Time: 12/30/20  5:34 PM   Result Value Ref Range   Phosphorus 4.2 2.5 - 4.6 mg/dL    Comment: Performed at [name], [address] [place], [name] [address]  Magnesium     Status: None   Collection Time: 12/30/20  5:34 PM  Result Value Ref Range   Magnesium 2.1 1.7 - 2.4 mg/dL    Comment: Performed at [facility], [address], [place], [name] [address]  Glucose, capillary     Status: Abnormal   Collection Time: 12/31/20  7:36 AM  Result Value Ref Range   Glucose-Capillary 105 (H) 70 - 99 mg/dL    Comment: Glucose reference range applies only to samples taken after fasting for at least 8 hours.  Glucose, capillary     Status: Abnormal   Collection Time: 12/31/20 11:14 PM  Result Value Ref Range   Glucose-Capillary 170 (H) 70 - 99 mg/dL    Comment: Glucose reference range applies only to samples taken after fasting for at least 8 hours.   Comment 1 Notify RN    Comment 2 Document in Chart   Glucose, capillary     Status: Abnormal   Collection Time: 01/01/21  7:51 AM  Result Value Ref Range   Glucose-Capillary 119 (H) 70 - 99 mg/dL    Comment: Glucose reference range applies only to samples taken after fasting for at least 8 hours.    Current Facility-Administered Medications  Medication Dose Route Frequency Provider Last Rate Last Admin  . (feeding supplement) PROSource Plus liquid 30 mL  30 mL Oral BID BM Ghimire, Kuber, MD   30 mL at 12/27/20 1420  . acetaminophen (TYLENOL) tablet 650 mg  650 mg Oral Q4H PRN Lynnell Catalan, MD   650 mg at 12/23/20 1545   Or  . acetaminophen (TYLENOL) 160 MG/5ML solution 650 mg  650 mg Per Tube Q4H PRN Lynnell Catalan, MD       Or  . acetaminophen (TYLENOL) suppository 650 mg  650 mg Rectal Q4H PRN Lynnell Catalan, MD      .  aspirin chewable tablet 81 mg  81 mg Oral Daily Lynnell Catalan, MD   81 mg at 12/30/20 0853  . atorvastatin (LIPITOR) tablet 40 mg  40 mg Oral Daily Dorcas Carrow, MD   40 mg at 12/30/20 0853  . carvedilol (COREG) tablet 6.25  mg  6.25 mg Oral BID WC Chrystie Nose, MD   6.25 mg at 12/30/20 0853  . clopidogrel (PLAVIX) tablet 75 mg  75 mg Oral Daily Marguerita Merles [place], DO   75 mg at 12/30/20 7342  . collagenase (SANTYL) ointment   Topical Daily Park Liter, DPM      . divalproex (DEPAKOTE ER) 24 hr tablet 500 mg  500 mg Oral Daily Dagar, Geralynn Rile, MD   500 mg at 12/30/20 0853  . docusate (COLACE) 50 MG/5ML liquid 100 mg  100 mg Oral Daily PRN Agarwala, Daleen Bo, MD      . feeding supplement (ENSURE ENLIVE / ENSURE PLUS) liquid 237 mL  237 mL Oral BID BM Dorcas Carrow, MD   237 mL at 12/30/20 0855  . folic acid (FOLVITE) tablet 1 mg  1 mg Oral Daily Marguerita Merles [place], DO   1 mg at 12/30/20 8768  . furosemide (LASIX) tablet 40 mg  40 mg Oral Daily Dorcas Carrow, MD   40 mg at 12/30/20 0853  . haloperidol lactate (HALDOL) injection 2 mg  2 mg Intramuscular Once [facility], Omair Latif, DO      . hydrALAZINE (APRESOLINE) tablet 25 mg  25 mg Oral TID Dorcas Carrow, MD   25 mg at 12/30/20 0853  . isosorbide mononitrate (IMDUR) 24 hr tablet 30 mg  30 mg Oral Daily Chrystie Nose, MD   30 mg at 12/30/20 0853  . levETIRAcetam (KEPPRA) tablet 500 mg  500 mg Oral BID Marvel Plan, MD   500 mg at 12/30/20 0853  . LORazepam (ATIVAN) tablet 1 mg  1 mg Oral Q4H PRN Dorcas Carrow, MD   1 mg at 12/30/20 0853  . magnesium oxide (MAG-OX) tablet 400 mg  400 mg Oral BID Dorcas Carrow, MD   400 mg at 12/30/20 0853  . multivitamin with minerals tablet 1 tablet  1 tablet Oral Daily Dorcas Carrow, MD   1 tablet at 12/30/20 0853  . ondansetron (ZOFRAN) injection 4 mg  4 mg Intravenous Q6H PRN Agarwala, Daleen Bo, MD      . oxyCODONE (Oxy IR/ROXICODONE) immediate release tablet 5-10 mg  5-10 mg Oral Q6H PRN Marguerita Merles [place], DO   10 mg at 01/01/21 1228  . phenol (CHLORASEPTIC) mouth spray 1 spray  1 spray Mouth/Throat PRN Zierle-Ghosh, Asia B, DO   1 spray at 12/21/20 0214  . polyethylene glycol (MIRALAX / GLYCOLAX) packet 17 g  17 g Per Tube  Daily Lynnell Catalan, MD   17 g at 12/27/20 0916  . sacubitril-valsartan (ENTRESTO) 24-26 mg per tablet  1 tablet Oral BID Lynnell Catalan, MD   1 tablet at 12/30/20 0853  . silver sulfADIAZINE (SILVADENE) 1 % cream   Topical BID Lynnell Catalan, MD   Given at 12/28/20 1011  . spironolactone (ALDACTONE) tablet 12.5 mg  12.5 mg Oral Daily [name], MD   12.5 mg at 12/30/20 0853  . thiamine tablet 100 mg  100 mg Oral Daily Marguerita Merles [place], DO   100 mg at 12/30/20 1157    Musculoskeletal: Strength & Muscle Tone: within normal limits Gait & Station: normal Patient leans: N/A  Psychiatric Specialty Exam: Physical Exam  Review of Systems   Blood pressure (!) 148/104, pulse 92, temperature 98.6 F (37 C), temperature source Oral, resp. rate 20, height 5\' 9"  (1.753 m), weight 80.2 kg, SpO2 98 %.Body mass index is 26.11 kg/m.  General Appearance: Casual  Eye Contact:  Fair  Speech:  Slow  Volume:  Increased  Mood:  Irritable and Defensive  Affect:  Appropriate and Congruent  Thought Process:  Goal Directed, Linear and Descriptions of Associations: Tangential  Orientation:  Full (Time, Place, and Person)  Thought Content:  Logical, Rumination and Tangential  Suicidal Thoughts:  No  Homicidal Thoughts:  No  Memory:  Immediate;   Fair Recent;   Poor  Judgement:  NA  Insight:  Fair  Psychomotor Activity:  NA  Concentration:  Concentration: Fair and Attention Span: Fair  Recall:  Poor  Fund of Knowledge:  Fair  Language:  Fair  Akathisia:  No  Handed:  Right  AIMS (if indicated):     Assets:  Desire for Improvement Resilience  ADL's:  Intact  Cognition:  WNL  Sleep:      Taro Hidrogo is a 55 year old male who presented with left cerebellar infarct, with significant history for bipolar disorder, and cocaine use disorder.  Patient is very well-known to our service, and at baseline is irritable, agitated, and blunt.  As previously noted patient continues to exhibit  significant agitation, irritability, and anger that impacts his overall care he is receiving while in the hospital.  Patient appears to be manipulative and gamy, as he is noted to refuse vital signs and other services, yet demands his pain medication.  Patient at this time does appear to have capacity to discharge home.  Although he does admit it is just a threat, and he knows he needs to continue to be in the hospital.  He reports being difficult with nursing staff, however is open to physical therapy (documentation does not reflect this). Patient denies suicidal ideations, homicidal ideations, and or auditory visual hallucinations.  Patient is encouraged to be polite and less irritable with hospital staff, in which he verbalizes understanding.  Patient does not meet criteria for inpatient psychiatric hospitalization at this time.   Treatment Plan Summary: Plan We will resume patient's home medication.  -Recommend social work referral to [facility] for medication management, and assistance with outpatient resources for behavioral health.  If patient is interested after medically stable and appropriate for discharge, consider referral to substance abuse residential treatment facility if he agrees. -Depakote ER 500 mg p.o. daily has been resumed. -As noted patient at baseline is irritable, increased agitation, however it is unsafe at this time to initiate any additional psychotropic medication.  Patient with EF of 20%, prolonged QTC of 515. -Patient with increasing agitation, however has a prolonged QTC.  Will attempt to obtain new EKG at this time prior to initiate in antipsychotic medications. -As noted above patient does have capacity to discharge AMA, however in the event he is not medically stable as determined by his medical team he will need to be IVC.  Patient did mention his attorney and legal ramifications if he is released from the hospital.  This is felt to be another  form of manipulation and secondary gain for pain medication. -Patient does not have safe disposition plan in place.  Will recommend continuing working with social work to facilitate assistance with SNF placement.  Disposition: No evidence of imminent risk to self or others at present.   Patient does  not meet criteria for psychiatric inpatient admission. Supportive therapy provided about ongoing stressors. Discussed crisis plan, support from social network, calling 911, coming to the Emergency Department, and calling Suicide Hotline.  Maryagnes Amosakia S Starkes-Perry, FNP 01/01/2021 12:29 PM

## 2021-01-01 NOTE — Progress Notes (Signed)
PROGRESS NOTE    [name]  PRF:[id number] DOB: 28-Dec-1965 DOA: 12/17/2020 PCP: [name]   Brief Narrative:  The patient is a 55 year old gentleman, initially with unknown identity brought to the ER after he complained of chest pain and became unresponsive with left-sided weakness and left gaze deviation. Was intubated in the ER. Apparently seen by bystanders when patient complained of chest pain and all of a sudden he became unresponsive. 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 1/17-1/20, admitted to ICU, extubated to 4 L oxygen on 1/20. 1/21, transferred to [facility] service, progressive bed. Patient has different MRN in our hospital system, he has frequent hospitalization and was recently admitted and discharged a week ago with diabetic toe ulcer. Review his previous records indicate that he is homeless, he is noncompliant, uses cocaine. He has a history of COPD, bipolar 1 disorder, known chronic heart failure with recent ejection fraction 40%, ischemic heart disease and previously declined PCI.  Significant diagnostics: 1/17 CT head: Left cerebellar hypodensity concerning for acute/subacute infarct. No intracranial hemorrhage. Small right parietal scalp hematoma 1/17 CTA head and neck: Non opacification of the left vertebral artery with retrograde filling of the distal left V4 segment. Bilateral carotid bifurcation atheromatous disease with 50% right and 30% left carotid bulb narrowing. Mild left P2 segment narrowing. 2D echo 1/18>>>EF <20%, hypokinesis, mod LVH, grade 2 diastolic dysfunction,  EEG 1/19>>This studyshowed evidence of epileptogenicity arising fromright>leftfrontal region. There is also moderatediffuse encephalopathy, nonspecific etiology butcould be secondary tosedation.No seizures were seen throughout the  recording.  Because the patient continued to have worsening neurological symptoms today became unresponsive a stat head CT was obtained again and he underwent EEG monitoring.  Code stroke was called today by the nursing.  EEG was within normal limits.  Patient did admit to having some suicidal ideation so he was placed on a one-to-one sitter and psychiatry is consulted for further evaluation.  Psych evaluated and deemed him not to be a risk and did not feel as if he was a risk to himself or others. Neurology evaluated and recommended ASA 81 mg Daily and Plavix 75 mg for 3 weeks and then ASA alone. Repeat MRI showed Acute Infarct involving the Right Superior Cerebellar and Right Middle Cerebellar Peduncle. They recommend Keppra 500 mg po BID   Palliative is consulted and patient does not want any of his children to be informed but wanted to his friend Alac to be notified.  He was placed on CIWA Protocol.  Continues to be agitated and continues to intermittently refuse his care and refused medications.  Was complaining of some foot pain will consult podiatry for further evaluation given his wounds.  Podiatry consulted and ordering X-Rays of the Foot and Non-Invasive Vascular Studies X-ray findings as below. Podiatry planning on Surgical Intervention tomorrow or Wednesday pending Cardiac Clearance. Cardiology re-consulted for Pre-op Clearance and he is at moderate risk for an intermediate risk surgery  ABIs have also been done and are as below.  Today patient was threatening leave AGAINST MEDICAL ADVICE and became extremely agitated and belligerent.  Psych was reconsulted for capacity evaluation and feel that he does have the capacity to make his own decisions including leaving AMA.  We were going to give him IM Haldol but this was held given his prolonged QTC and his EF of 20%.  Patient was threatening to leave AMA because  the nursing staff was withholding his pain medications for unclear reasons despite it  being ordered.  Once he receives his pain medications he calmed down.  Awaiting for podiatry to discuss with the patient about his surgical options but he is agreeable.  Psych recommends that his capacity may need to be reassessed as the patient's mental status changes over time.  Per psych if he is not medically stable to be discharged home based off of evaluation recommendations he will need to be placed under IVC.  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 They recommend Keppra 500 mg po BID"  -MRA of the head, acute occlusion of the right cerebellar artery  -LDL 72, start Lipitor 40.  Liver function tests normalized. -A1c 6.4 -EEG with diffuse encephalopathy -Patient was treated with heparin.  Now on aspirin and Plavix for 3 weeks then aspirin alone. 1/25, patient complained of persistent right-sided weakness that he claims worsening,  -He had a repeat head CT yesterday and again had some neurological deficits today still repeat head code stroke was called today. -Underwent a repeat MRI and EEG done and showed "This study is within normal limits. No seizures or epileptiform discharges were seen throughout the recording." -Neurology recommending DAPT for 3 weeks then ASA alone; per Cardiology if needed ASA can be held prior to and Post Surgery but Plavix should be continued given recent CVA and Underlying CAD -palliative care consulted for goals of care discussion  Acute on chronic systolic and diastolic congestive heart failure CAD -Recent echocardiogram with ejection fraction 40% -New echocardiogram  ejection fraction less than 20%. -On aspirin, Lipitor, Entresto, aldactone, coreg and Imdur and oral lasix.  -Cardiology reconsulted and they are concerned about long term adherence to medications especially BID and TID Meds,  -Currently Euvolemic  -Kidney functions are stable. -Patient is noncompliant and will be difficult to treat. -Strict I's and O's; He is - 9.5429 liters  -Continue to Monitor closely -Cardiology recommending Cath as an outpatient at least 1 month after recovery from CVA   Questionable seizure activity:  -No more seizure activities.  Neurology recommended no AEDs at this time but then he became altered again this afternoon so neurology repeated a stat EEG and placed him on low-dose Keppra 500 twice daily. -He has impulsiveness and some tremors, unlikely seizure.  Acute Hypoxemic Respiratory Failure:  -Secondary to stroke.  Currently on room air. -SpO2: 98 % O2 Flow Rate (L/min): 4 L/min FiO2 (%): 30 % -Continue monitor respiratory status carefully and if necessary will place back on supplemental oxygen via nasal cannula  Acute metabolic toxic encephalopathy:  -Multifactorial.  Stroke, drug use, acute respiratory failure and sedation.  Stabilizing. Patient has baseline behavior problems and intermittent agitations, aggressiveness towards the staff.  He endorsed some psychotic symptoms.  Seen by psychiatry and cleared for any lethality. -We will place on a CIWA protocol given his history of alcohol abuse continue monitor given that he has been significantly agitated -Will need placement to skilled nursing facility and he is not only able to be placed.  Acute liver failure: -Improved.  Probably due to ischemic liver. -AST is 26 and ALT is  31 yesterday evening  -Continue to Monitor and Trend   Hyperphosphatemia -Mild as Phos Level is 4.2 yesterday and repeat this AM is pending as he has refused labs -Continue to Monitor and Trend -Repeat Phos Level in the AM    Cocaine use:  -Counseled to quit.  Hyponatremia -Mild at 134 the day before yesterday and is now 137 yesterday  -Continue to monitor and trend repeat CMP in a.m.  Bipolar disorder/homelessness: Poor compliance. Homelessness.  Consulted psychiatry.    Admits to having some suicidal ideation so we will place him on a one-to-one sitter but Psych evaluated and cleared him a danger to himself -he threatened to leave AMA today but was upset that she did not get his pain medications.  Currently he is unsafe discharge home and he is not going to ask to leave the hospital his was very upset with the nursing staff -Psych was reconsulted please see their consult note  Deconditioning  -Continue mobilizing.  Referred to SNF. -Patient is throwing away his telemetry and taking off his IV lines, discontinue telemetry.  Transferred to MedSurg bed. -Transfer to skilled nursing facility if available per social work placement will be extremely difficult.  Cannot safely discharge the patient home as he lives by himself -Worked with PT and they documented: "Pt progressing from mobility stand point and was able to amb 30' this date with RW and modAx2 for safety/chair follow. Pt remains ataxic, with decreased insight to deficits and safety, easily irritated with increased falls risk, and unsafe to return home/to the streets. Session limited today due to pt irritated about therapist "rushing" him despite education on the purpose of therapy and how we are there to help get better."    Bilateral Feet Hallux ulcers -Initially thought to be Probably ischemic leg ulcers.  Local wound care.   -With acute a stroke, behavioral problems, ? surgical candidate at this time. -Will consider ABI and X-Rays but will defer to Podiatry;Podiatry has obtaining Bilateral Foot X-Rays and have ordered noninvasive vascular studies -Left Foot X-Ray showed "Small area of focal osteomyelitis involving the medial aspect of the base of  the 1st distal phalanx. Tiny amount of soft tissue gas in the medial aspect of the great toe at the level of the 1st IP joint. This could be due to infection with a gas-forming organism or represent air introduced through the patient's area of ulceration. Multiple tiny bone fragments at the medial aspect of the 1st IP joint, possibly related to chronic infection in that area or degenerative fragmentation. " -Right Foot X-Ray showed "Soft tissue ulceration and gas in the great toe without radiographic evidence of osteomyelitis. The gas is concerning for infection with a gas-forming organism." -Currently he is Afebrile and has No Leukocytosis so there is no real indication for antibiotics at this time but will wait for possible debridement and then obtain cultures intraoperatively for targeted therapy -Podiatry is recommending weightbearing in surgical shoes bilaterally and they have been ordered next-wound care as recommended by Santyl wet-to-dry and this is to be applied daily -Podiatry recommending surgical intervention and Cardiology cleared the patient but feel he is at moderate risk for an intermediate risk Surgery.  -Pain control adjusted and changed Tramadol to Oxycodone IR and increased to 5 to 10 mg but nursing staff is withholding this for unclear reasons.  Patient will be getting his pain medication now and anticipating undergoing operative intervention for his bilateral hallux ulcers by Dr. Samuella CotaPrice.  Further recommendations per podiatry  Non-Compliance -Has been intermittently agitated and non-compliant with Care and continues to refuse Vital Signs, Medication Administration, Assessments,   DVT prophylaxis: SCDs Code Status: FULL CODE Family Communication: No family present at bedside Disposition Plan: SNF when able as he is not a safe discharge disposition for home given his behavioral issues and because of his significant weakness and now Bilateral Hallux Ulcers that need Surgical  Intervention   Status is: Inpatient  Remains inpatient appropriate because:Unsafe d/c plan, IV treatments appropriate due to intensity of illness or inability to take PO and Inpatient level of care appropriate due to severity of illness   Dispo: The patient is from: Home              Anticipated d/c is to: SNF              Anticipated d/c date is: 3 days              Patient currently is not medically stable to d/c.   Difficult to place patient Yes  Consultants:   Neurology  PCCM transfer  Cardiology  Palliative care Medicine  Podiatry   Procedures: See above  Repeat EEG 12/26/2020: This study is within normal limits. No seizures or epileptiform discharges were seen throughout the recording.  Antimicrobials:  Anti-infectives (From admission, onward)   Start     Dose/Rate Route Frequency Ordered Stop   12/17/20 1450  ceFAZolin (ANCEF) 2-4 GM/100ML-% IVPB       Note to Pharmacy: Cristy Friedlander   : cabinet override      12/17/20 1450 12/17/20 1911        Subjective: Seen and examined at bedside and he is again upset that his pain medications not being given to him.  I discussed with him that I changed him to oxycodone IR as needed and he states that he has not been getting it at all and only getting Tylenol which does not hold his pain in his feet.  I discussed with them and I reassured him that I had ordered it and I increased the dose today however he then became upset when nursing staff not give it to him for unclear reasons.  He threatened to leave AMA today and became very belligerent and combative.  Psychiatry was reconsulted but after they evaluated they deemed the make his own decisions but his discharge disposition is unclear if this patient cannot really walk ambulate and does not have a safe place to go.  He calmed down after he got his pain medications and was just upset.  He is easily redirectable.  Further plans per podiatry.  He has no other concerns or complaints  at this time but continues to complain of pain in his feet and denies any shortness breath, nausea or vomiting.  Objective: Vitals:   12/31/20 0734 12/31/20 1938 12/31/20 2318 01/01/21 0751  BP: (!) 145/101 (!) 148/103 (!) 156/103 (!) 148/104  Pulse: 93 97 85 92  Resp: 18 18 18 20   Temp:  99 F (37.2 C) 98.6 F (37 C)   TempSrc:  Oral Oral   SpO2: 100% 100% 98%   Weight:      Height:        Intake/Output Summary (Last 24 hours) at 01/01/2021 1515 Last data filed at 01/01/2021 0347 Gross per 24 hour  Intake -  Output 600 ml  Net -600 ml   Filed Weights   12/23/20 0500 12/28/20 0517 12/29/20 0526  Weight: 85.1 kg 84.8 kg 80.2  kg   Examination: Physical Exam:  Constitutional: WN/WD overweight AAM was a little agitated this AM Eyes: Lids and conjunctivae normal, sclerae anicteric  ENMT: External Ears, Nose appear normal. Grossly normal hearing.  Neck: Appears normal, supple, no cervical masses, normal ROM, no appreciable thyromegaly; no JVD Respiratory: Diminished to auscultation bilaterally, no wheezing, rales, rhonchi or crackles. Normal respiratory effort and patient is not tachypenic. No accessory muscle use. Unlabored breathing  Cardiovascular: RRR, no murmurs / rubs / gallops. S1 and S2 auscultated. No extremity edema.  Abdomen: Soft, non-tender, non-distended. Bowel sounds positive.  GU: Deferred. Musculoskeletal: No clubbing / cyanosis of digits/nails. No joint deformity upper and lower extremities.  Skin: Bilateral Toe Ulcers. No induration; Warm and dry.  Neurologic: CN 2-12 grossly intact with no focal deficits. Romberg sign and cerebellar reflexes not assessed.  Psychiatric: Normal judgment and insight. Alert and oriented x 3. Agitated mood and appropriate affect.   Data Reviewed: I have personally reviewed following labs and imaging studies  CBC: Recent Labs  Lab 12/26/20 0848 12/27/20 0304 12/28/20 0534 12/28/20 1437 12/29/20 0102 12/30/20 1734  WBC 5.3  6.0 6.2 5.4 5.2 6.3  NEUTROABS 2.4 2.2 2.4  --  1.7 2.5  HGB 14.1 15.1 15.1 15.0 14.7 15.6  HCT 43.6 46.5 44.9 46.5 45.3 46.5  MCV 95.4 95.5 94.1 95.3 95.8 95.3  PLT 374 394 414* 418* 411* 419*   Basic Metabolic Panel: Recent Labs  Lab 12/27/20 0304 12/28/20 0534 12/28/20 1437 12/29/20 0102 12/30/20 1734  NA 136 136 138 134* 137  K 4.5 4.6 4.3 4.1 4.2  CL 103 101 102 102 102  CO2 22 23 24 23 24   GLUCOSE 113* 111* 184* 184* 123*  BUN 21* 22* 22* 20 17  CREATININE 1.10 1.02 1.09 1.03 1.08  CALCIUM 9.2 9.3 9.1 9.2 9.3  MG 2.0 2.3 2.1 2.0 2.1  PHOS 4.7* 4.5 4.8* 4.7* 4.2   GFR: Estimated Creatinine Clearance: 80 mL/min (by C-G formula based on SCr of 1.08 mg/dL). Liver Function Tests: Recent Labs  Lab 12/27/20 0304 12/28/20 0534 12/28/20 1437 12/29/20 0102 12/30/20 1734  AST 23 29 27 24 26   ALT 42 38 36 34 31  ALKPHOS 77 78 81 74 78  BILITOT 0.2* 0.7 0.5 0.9 0.5  PROT 7.5 7.7 7.4 7.4 8.0  ALBUMIN 2.8* 2.9* 2.8* 2.8* 3.1*   No results for input(s): LIPASE, AMYLASE in the last 168 hours. No results for input(s): AMMONIA in the last 168 hours. Coagulation Profile: No results for input(s): INR, PROTIME in the last 168 hours. Cardiac Enzymes: No results for input(s): CKTOTAL, CKMB, CKMBINDEX, TROPONINI in the last 168 hours. BNP (last 3 results) No results for input(s): PROBNP in the last 8760 hours. HbA1C: No results for input(s): HGBA1C in the last 72 hours. CBG: Recent Labs  Lab 12/28/20 2001 12/30/20 1148 12/31/20 0736 12/31/20 2314 01/01/21 0751  GLUCAP 152* 165* 105* 170* 119*   Lipid Profile: No results for input(s): CHOL, HDL, LDLCALC, TRIG, CHOLHDL, LDLDIRECT in the last 72 hours. Thyroid Function Tests: No results for input(s): TSH, T4TOTAL, FREET4, T3FREE, THYROIDAB in the last 72 hours. Anemia Panel: No results for input(s): VITAMINB12, FOLATE, FERRITIN, TIBC, IRON, RETICCTPCT in the last 72 hours. Sepsis Labs: No results for input(s):  PROCALCITON, LATICACIDVEN in the last 168 hours.  No results found for this or any previous visit (from the past 240 hour(s)).  RN Pressure Injury Documentation:     Estimated body mass index is 26.11  kg/m as calculated from the following:   Height as of this encounter: 5\' 9"  (1.753 m).   Weight as of this encounter: 80.2 kg.  Malnutrition Type:  Nutrition Problem: Inadequate oral intake Etiology: inability to eat  Malnutrition Characteristics:  Signs/Symptoms: NPO status   Nutrition Interventions:  Interventions: Prostat,MVI,Ensure Enlive (each supplement provides 350kcal and 20 grams of protein)   Radiology Studies: VAS [place] ABI WITH/WO TBI  Result Date: 12/31/2020 LOWER EXTREMITY DOPPLER STUDY Indications: Ulceration. High Risk Factors: None.  Comparison Study: No prior studies. Performing Technologist: Olen Cordial RVT  Examination Guidelines: A complete evaluation includes at minimum, Doppler waveform signals and systolic blood pressure reading at the level of bilateral brachial, anterior tibial, and posterior tibial arteries, when vessel segments are accessible. Bilateral testing is considered an integral part of a complete examination. Photoelectric Plethysmograph (PPG) waveforms and toe systolic pressure readings are included as required and additional duplex testing as needed. Limited examinations for reoccurring indications may be performed as noted.  ABI Findings: +---------+------------------+-----+---------+--------+ Right    Rt Pressure (mmHg)IndexWaveform Comment  +---------+------------------+-----+---------+--------+ Brachial 143                    triphasic         +---------+------------------+-----+---------+--------+ PTA      164               1.15 triphasic         +---------+------------------+-----+---------+--------+ DP       148               1.03 triphasic         +---------+------------------+-----+---------+--------+ Great Toe                                 Ulcer    +---------+------------------+-----+---------+--------+ +---------+------------------+-----+---------+-------+ Left     Lt Pressure (mmHg)IndexWaveform Comment +---------+------------------+-----+---------+-------+ Brachial 140                    triphasic        +---------+------------------+-----+---------+-------+ PTA      152               1.06 triphasic        +---------+------------------+-----+---------+-------+ DP       153               1.07 triphasic        +---------+------------------+-----+---------+-------+ Great Toe                                Ulcer   +---------+------------------+-----+---------+-------+ +-------+-----------+-----------+------------+------------+ ABI/TBIToday's ABIToday's TBIPrevious ABIPrevious TBI +-------+-----------+-----------+------------+------------+ Right  1.15                                           +-------+-----------+-----------+------------+------------+ Left   1.07                                           +-------+-----------+-----------+------------+------------+  Summary: Right: Resting right ankle-brachial index is within normal range. No evidence of significant right lower extremity arterial disease. Unable to obtain TBI due to great toe ulceration. Left: Resting left ankle-brachial index is within normal range. No  evidence of significant left lower extremity arterial disease. Unable to obtain TBI due to great toe ulceration.  *See table(s) above for measurements and observations.  Electronically signed by Sherald Hess MD on 12/31/2020 at 4:02:48 PM.    Final    Scheduled Meds: . (feeding supplement) PROSource Plus  30 mL Oral BID BM  . aspirin  81 mg Oral Daily  . atorvastatin  40 mg Oral Daily  . carvedilol  6.25 mg Oral BID WC  . clopidogrel  75 mg Oral Daily  . collagenase   Topical Daily  . divalproex  500 mg Oral Daily  . feeding supplement  237 mL Oral BID  BM  . folic acid  1 mg Oral Daily  . furosemide  40 mg Oral Daily  . hydrALAZINE  25 mg Oral TID  . isosorbide mononitrate  30 mg Oral Daily  . levETIRAcetam  500 mg Oral BID  . magnesium oxide  400 mg Oral BID  . multivitamin with minerals  1 tablet Oral Daily  . polyethylene glycol  17 g Per Tube Daily  . sacubitril-valsartan  1 tablet Oral BID  . silver sulfADIAZINE   Topical BID  . spironolactone  12.5 mg Oral Daily  . thiamine  100 mg Oral Daily   Continuous Infusions:   LOS: 15 days   Merlene Laughter, DO [facility] PAGER is on AMION  If 7PM-7AM, please contact night-coverage [email or web address]

## 2021-01-01 NOTE — Progress Notes (Signed)
Patient refused nighttime medications when asked and wanted to be left alone. Pt stated "just turn off the light. That's all I want."

## 2021-01-01 NOTE — Progress Notes (Signed)
Doctor has been made aware of patient's BP and I have been to told to continue to monitor. No interventions needed at this time.

## 2021-01-01 NOTE — Progress Notes (Signed)
Patient continues to refuse assessment, CBGs, and medications.

## 2021-01-01 NOTE — Progress Notes (Addendum)
Patient refusing assessment, allowed tech to collect vitals. BP 148/104, MD aware. Patient also refused morning medications stating "I don't want that" and "leave me alone". Attempted to educate patient.

## 2021-01-01 NOTE — Progress Notes (Signed)
  Subjective:  Patient ID: [name], male    DOB: 10/28/1966,  MRN: [id number]  Patient seen bedside.  Denies new complaints.  Still has pain in both toes. Objective:   Vitals:   12/31/20 2318 01/01/21 0751  BP: (!) 156/103 (!) 148/104  Pulse: 85 92  Resp: 18 20  Temp: 98.6 F (37 C)   SpO2: 98%    General AA&O x3. Normal mood and affect.  Vascular Dorsalis pedis and posterior tibial pulses  barely palpable bilaterally  Capillary refill delayed ~ 5 seconds to both hallux Pedal hair growth absent.  Neurologic Epicritic sensation grossly present.  Dermatologic (Wound)  Bilateral medial hallux ulcerations, with overlying eschar. Unclear depth. No palpable bone in the wound. Left hallux with significant overlying hyperkeratosis. No surrounding warmth or erythema noted.  Orthopedic: Motor intact BLE.    Assessment & Plan:  Patient was evaluated and treated and all questions answered.  Ulcers Bilateral great toes, osteomyelitis noted left  -Reviewed XR Results with patient.  -Vascular studies reviewed suggestive of adequate perfusion -We did discuss given the severity of the ulcerations to both great toes and the known osteomyelitis to the left great toe that amputation of both toes is indicated for resolution of the ulcerations and infection.  While the right great toe does not show signs of osteomyelitis now the ulcer appears to go down to bone.  The quickest way for resolution of this would be for amputation.  Patient amenable to procedure. -Ok to hold abx until procedure -OR tomorrow for bilateral 1st toe amputation -N.p.o. after midnight.  Consent order placed.  Cole Klugh J Alyus Mofield, DPM  Accessible via secure chat for questions or concerns.  

## 2021-01-01 NOTE — Consult Note (Signed)
Addendum-EKG obtained today continues to show prolong QTc of 515. At this time time will recommend refraining from antipsychotics to manage his agitation and irritability. Patient can be difficult at times but is easily redirected and works better with positive reinforcement.    Based on our examination, the patient does meet criteria for decision making to go home AMA. It should be emphasized however, that capacity may need to be re-assessed, as the patient's mental status changes over time. Also, decisional capacity for other, specific treatment/disposition decisions will need to be evaluated on an individual basis. If patient is not medically stable to discharge home, based off medical team recommendations he will need to be placed under IVC.    Psychiatry to sign off.

## 2021-01-01 NOTE — Progress Notes (Signed)
OT Cancellation Note  Patient Details Name: [name] MRN: [id number] DOB: [birth year]   Cancelled Treatment:    Reason Eval/Treat Not Completed: Medical issues which prohibited therapy. Patient refused nighttime meds now with elevated BP. OT will check back as time allows.   Kallie Edward OTR/L Supplemental OT, Department of rehab services [phone]  Jennette Leask R H. 01/01/2021, 7:16 AM

## 2021-01-01 NOTE — H&P (View-Only) (Signed)
  Subjective:  Patient ID: [name], male    DOB: Dec 03, 1965,  MRN: [id number]  Patient seen bedside.  Denies new complaints.  Still has pain in both toes. Objective:   Vitals:   12/31/20 2318 01/01/21 0751  BP: (!) 156/103 (!) 148/104  Pulse: 85 92  Resp: 18 20  Temp: 98.6 F (37 C)   SpO2: 98%    General AA&O x3. Normal mood and affect.  Vascular Dorsalis pedis and posterior tibial pulses  barely palpable bilaterally  Capillary refill delayed ~ 5 seconds to both hallux Pedal hair growth absent.  Neurologic Epicritic sensation grossly present.  Dermatologic (Wound)  Bilateral medial hallux ulcerations, with overlying eschar. Unclear depth. No palpable bone in the wound. Left hallux with significant overlying hyperkeratosis. No surrounding warmth or erythema noted.  Orthopedic: Motor intact BLE.    Assessment & Plan:  Patient was evaluated and treated and all questions answered.  Ulcers Bilateral great toes, osteomyelitis noted left  -Reviewed XR Results with patient.  -Vascular studies reviewed suggestive of adequate perfusion -We did discuss given the severity of the ulcerations to both great toes and the known osteomyelitis to the left great toe that amputation of both toes is indicated for resolution of the ulcerations and infection.  While the right great toe does not show signs of osteomyelitis now the ulcer appears to go down to bone.  The quickest way for resolution of this would be for amputation.  Patient amenable to procedure. -Ok to hold abx until procedure -OR tomorrow for bilateral 1st toe amputation -N.p.o. after midnight.  Consent order placed.  Park Liter, DPM  Accessible via secure chat for questions or concerns.

## 2021-01-01 NOTE — Progress Notes (Signed)
Patient agitated, attempting to get out of bed by himself, states "I need to go to the bathroom" nurse and CNA attempted to help him to the bathroom but patient refused. Patient educated on the need to have help when getting out of bed and told not to get out of bed alone. Patient continues to try to get up by himself. Floor mats in place, bed alarm on.

## 2021-01-02 ENCOUNTER — Inpatient Hospital Stay (HOSPITAL_COMMUNITY): Payer: Medicaid Other

## 2021-01-02 ENCOUNTER — Encounter (HOSPITAL_COMMUNITY): Payer: Self-pay | Admitting: Neurology

## 2021-01-02 ENCOUNTER — Encounter (HOSPITAL_COMMUNITY)
Admission: EM | Disposition: A | Discharge status: Home / Self Care | Payer: Self-pay | Source: Home / Self Care | Attending: Internal Medicine

## 2021-01-02 ENCOUNTER — Inpatient Hospital Stay (HOSPITAL_COMMUNITY): Payer: Medicaid Other | Admitting: Anesthesiology

## 2021-01-02 DIAGNOSIS — M86172 Other acute osteomyelitis, left ankle and foot: Secondary | ICD-10-CM | POA: Diagnosis not present

## 2021-01-02 DIAGNOSIS — I5043 Acute on chronic combined systolic (congestive) and diastolic (congestive) heart failure: Secondary | ICD-10-CM | POA: Diagnosis not present

## 2021-01-02 DIAGNOSIS — M869 Osteomyelitis, unspecified: Secondary | ICD-10-CM | POA: Diagnosis present

## 2021-01-02 HISTORY — PX: AMPUTATION TOE: SHX6595

## 2021-01-02 LAB — CBC WITH DIFFERENTIAL/PLATELET
Abs Immature Granulocytes: 0.02 10*3/uL (ref 0.00–0.07)
Basophils Absolute: 0 10*3/uL (ref 0.0–0.1)
Basophils Relative: 1 %
Eosinophils Absolute: 0.1 10*3/uL (ref 0.0–0.5)
Eosinophils Relative: 2 %
HCT: 47.3 % (ref 39.0–52.0)
Hemoglobin: 15.4 g/dL (ref 13.0–17.0)
Immature Granulocytes: 0 %
Lymphocytes Relative: 48 %
Lymphs Abs: 2.7 10*3/uL (ref 0.7–4.0)
MCH: 30.9 pg (ref 26.0–34.0)
MCHC: 32.6 g/dL (ref 30.0–36.0)
MCV: 94.8 fL (ref 80.0–100.0)
Monocytes Absolute: 0.9 10*3/uL (ref 0.1–1.0)
Monocytes Relative: 15 %
Neutro Abs: 2 10*3/uL (ref 1.7–7.7)
Neutrophils Relative %: 34 %
Platelets: 338 10*3/uL (ref 150–400)
RBC: 4.99 MIL/uL (ref 4.22–5.81)
RDW: 14 % (ref 11.5–15.5)
WBC: 5.8 10*3/uL (ref 4.0–10.5)
nRBC: 0 % (ref 0.0–0.2)

## 2021-01-02 LAB — COMPREHENSIVE METABOLIC PANEL
ALT: 25 U/L (ref 0–44)
AST: 28 U/L (ref 15–41)
Albumin: 2.9 g/dL — ABNORMAL LOW (ref 3.5–5.0)
Alkaline Phosphatase: 66 U/L (ref 38–126)
Anion gap: 12 (ref 5–15)
BUN: 13 mg/dL (ref 6–20)
CO2: 23 mmol/L (ref 22–32)
Calcium: 9.6 mg/dL (ref 8.9–10.3)
Chloride: 103 mmol/L (ref 98–111)
Creatinine, Ser: 0.9 mg/dL (ref 0.61–1.24)
GFR, Estimated: >60 mL/min (ref >60)
Glucose, Bld: 103 mg/dL — ABNORMAL HIGH (ref 70–99)
Potassium: 3.9 mmol/L (ref 3.5–5.1)
Sodium: 138 mmol/L (ref 135–145)
Total Bilirubin: 0.6 mg/dL (ref 0.3–1.2)
Total Protein: 7.5 g/dL (ref 6.5–8.1)

## 2021-01-02 LAB — GLUCOSE, CAPILLARY: Glucose-Capillary: 187 mg/dL — ABNORMAL HIGH (ref 70–99)

## 2021-01-02 LAB — PHOSPHORUS: Phosphorus: 4.5 mg/dL (ref 2.5–4.6)

## 2021-01-02 LAB — MAGNESIUM: Magnesium: 1.9 mg/dL (ref 1.7–2.4)

## 2021-01-02 IMAGING — DX DG FOOT 2V*L*
2 series · 2 of 2 positions shown · non-contrast
Comparison: [DATE]

CLINICAL DATA: Status post amputation of the left great toe.

EXAM:
LEFT FOOT - 2 VIEW

[foot ap]
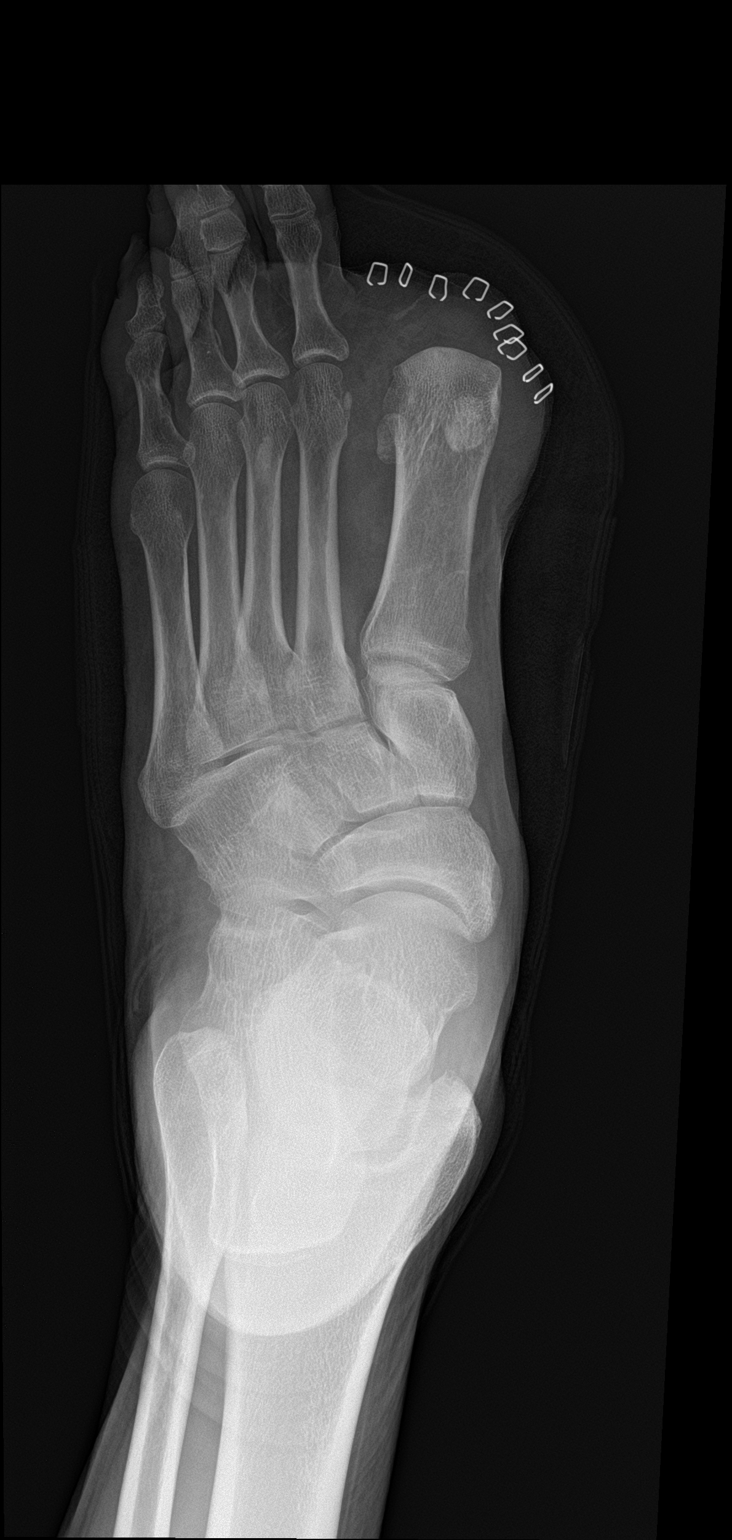

[foot lat]
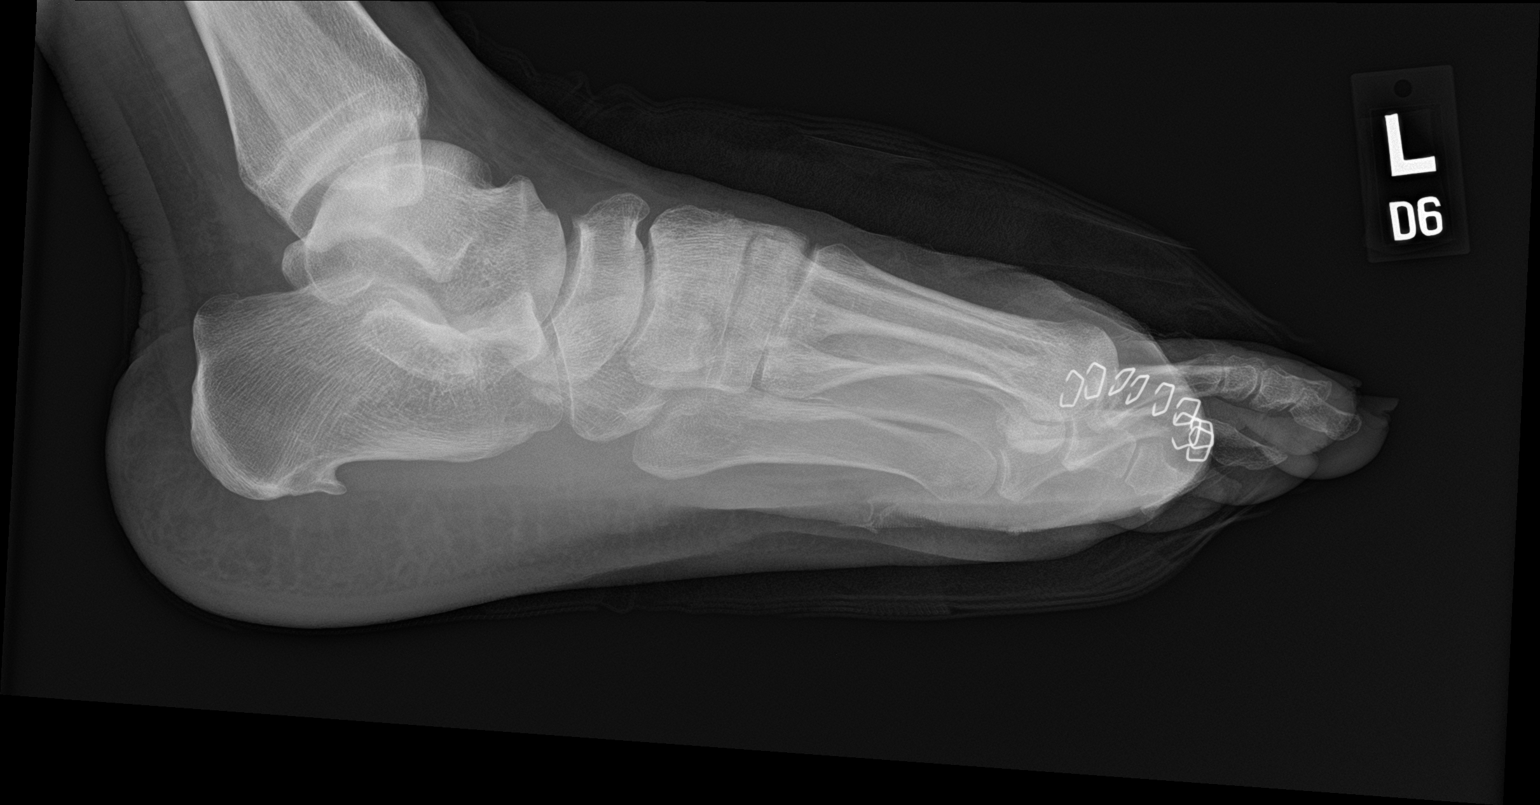

[2 of 2 positions shown; findings below may reference images not displayed]

FINDINGS: Amputation of the right great toe. No complicating features are
identified. The first metatarsal is intact. The other bony
structures are intact. Small calcaneal heel spur noted.
IMPRESSION: 1. Amputation of the right great toe.
2. No complicating features.

## 2021-01-02 IMAGING — DX DG FOOT 2V*R*
2 series · 2 of 2 positions shown · non-contrast
Comparison: Radiographs [DATE]

CLINICAL DATA: Right great toe amputation.

EXAM:
RIGHT FOOT - 2 VIEW

[foot ap]
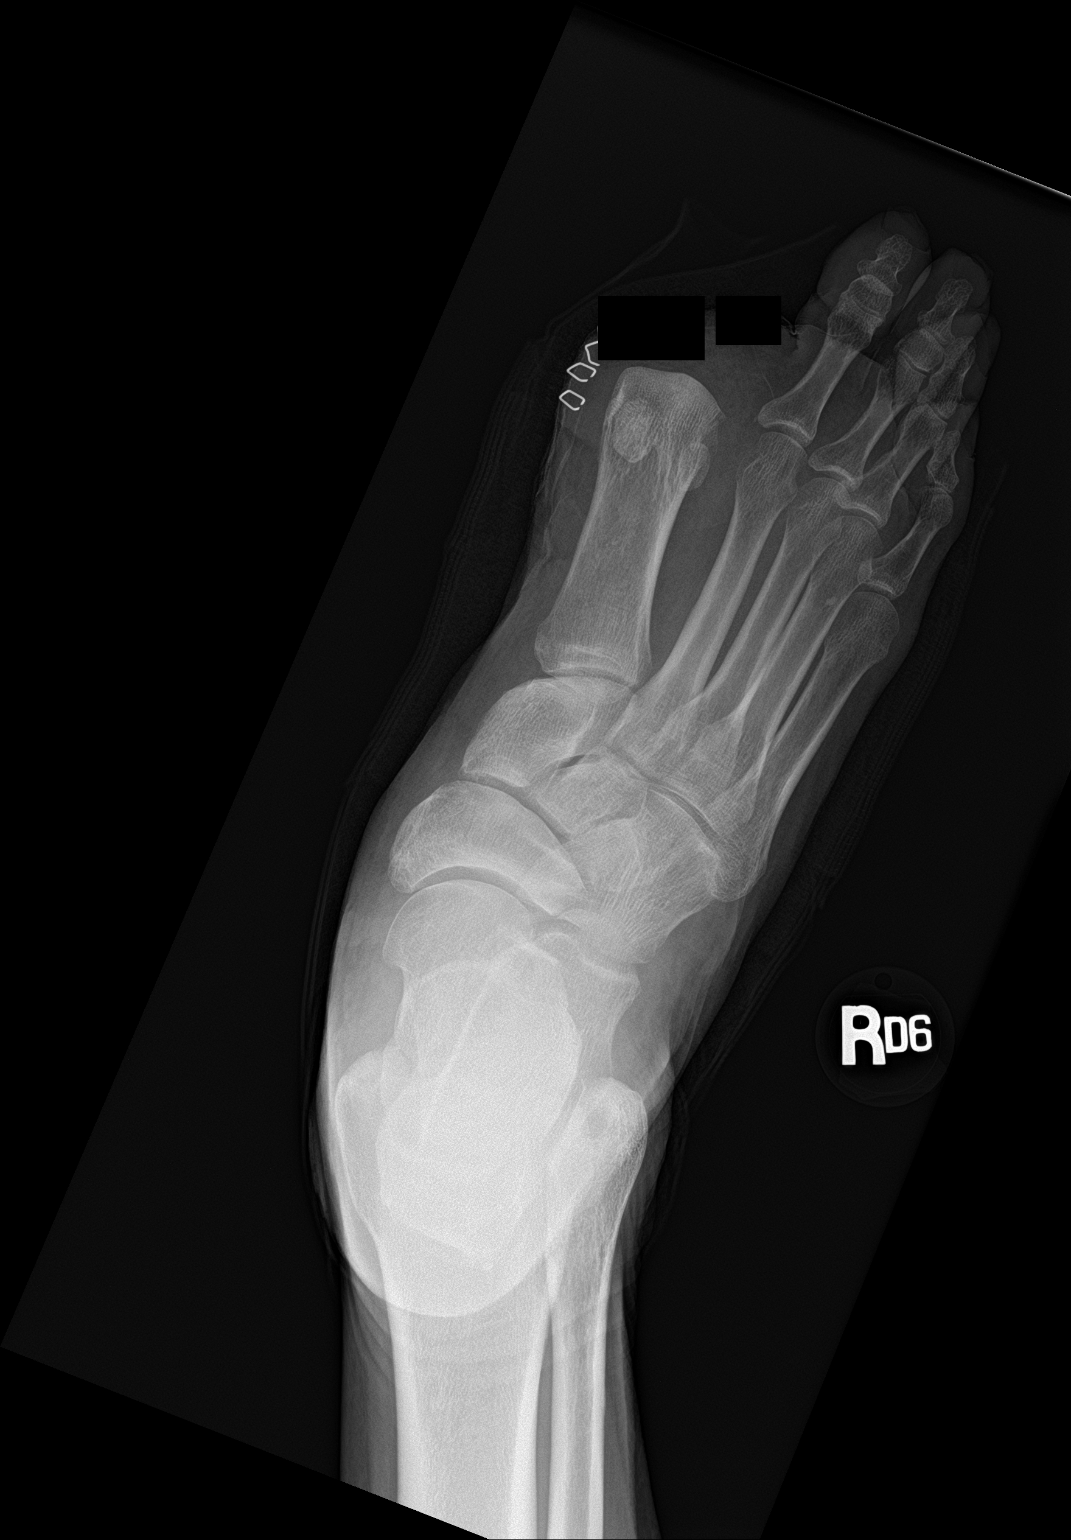

[foot lat]
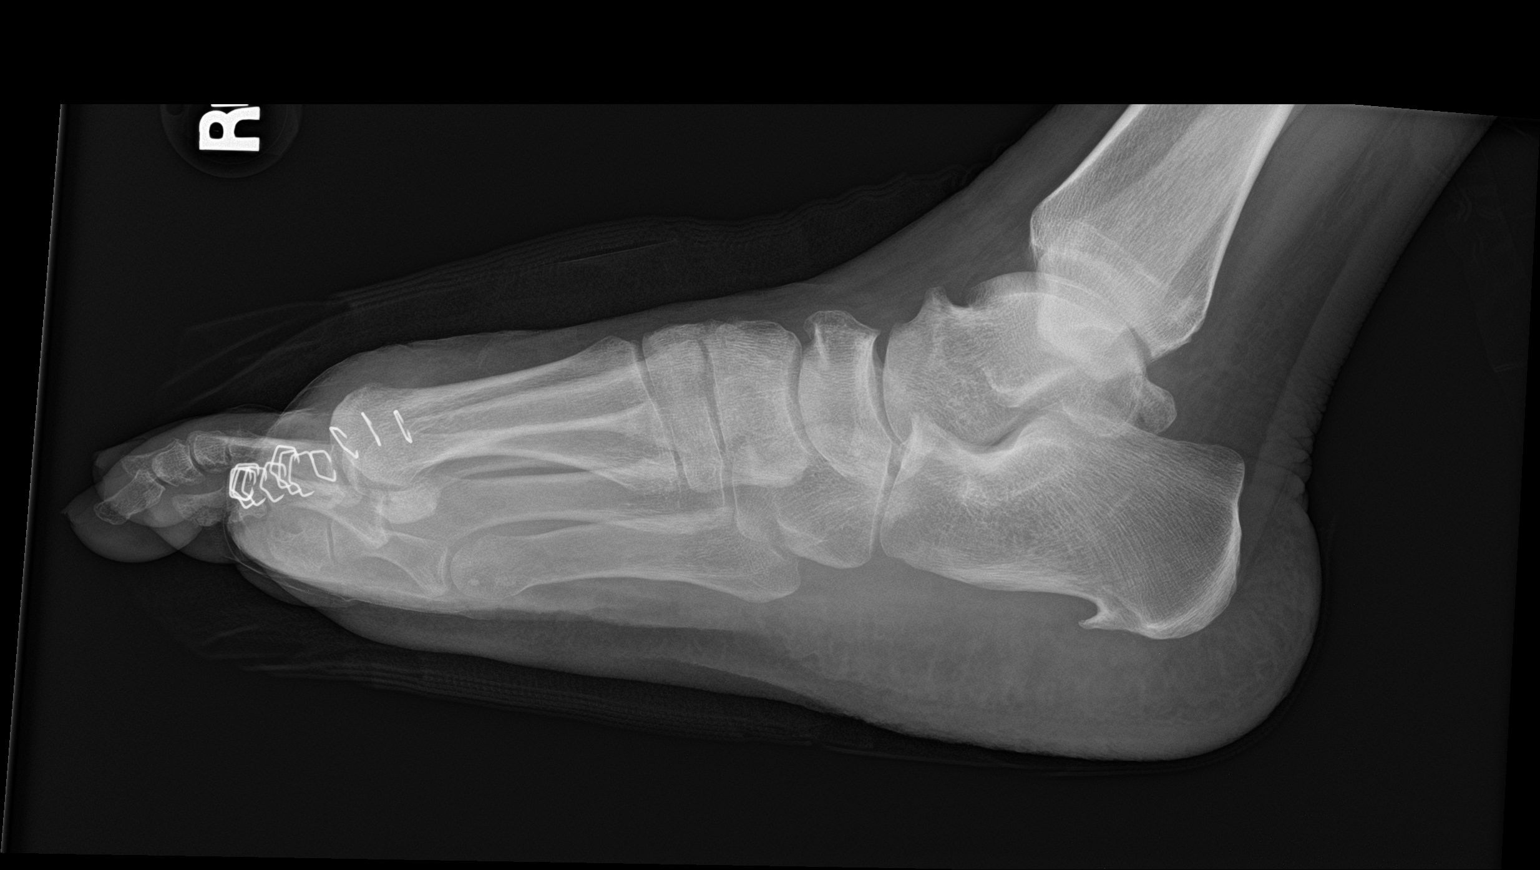

[2 of 2 positions shown; findings below may reference images not displayed]

FINDINGS: Status post amputation of the right great toe. No complicating
features are identified.

The other bony structures are intact.
IMPRESSION: Status post amputation of the right great toe.

## 2021-01-02 SURGERY — AMPUTATION, TOE
Anesthesia: Monitor Anesthesia Care | Site: Toe | Laterality: Bilateral

## 2021-01-02 MED ORDER — PROPOFOL 500 MG/50ML IV EMUL
INTRAVENOUS | Status: DC | PRN
  Administered 2021-01-02: 50 ug/kg/min via INTRAVENOUS

## 2021-01-02 MED ORDER — FENTANYL CITRATE (PF) 250 MCG/5ML IJ SOLN
INTRAMUSCULAR | Status: AC
  Filled 2021-01-02: qty 5

## 2021-01-02 MED ORDER — MIDAZOLAM HCL 2 MG/2ML IJ SOLN
INTRAMUSCULAR | Status: AC
  Filled 2021-01-02: qty 2

## 2021-01-02 MED ORDER — BUPIVACAINE HCL (PF) 0.5 % IJ SOLN
INTRAMUSCULAR | Status: AC
  Filled 2021-01-02: qty 30

## 2021-01-02 MED ORDER — 0.9 % SODIUM CHLORIDE (POUR BTL) OPTIME
TOPICAL | Status: DC | PRN
  Administered 2021-01-02: 1000 mL

## 2021-01-02 MED ORDER — VANCOMYCIN HCL 1000 MG IV SOLR
INTRAVENOUS | Status: AC
  Filled 2021-01-02: qty 1000

## 2021-01-02 MED ORDER — CHLORHEXIDINE GLUCONATE 0.12 % MT SOLN
15.0000 mL | OROMUCOSAL | Status: AC
  Filled 2021-01-02: qty 15

## 2021-01-02 MED ORDER — VANCOMYCIN HCL 1 G IV SOLR
INTRAVENOUS | Status: DC | PRN
  Administered 2021-01-02: 1000 mg

## 2021-01-02 MED ORDER — FENTANYL CITRATE (PF) 100 MCG/2ML IJ SOLN
INTRAMUSCULAR | Status: DC | PRN
  Administered 2021-01-02 (×4): 25 ug via INTRAVENOUS

## 2021-01-02 MED ORDER — MIDAZOLAM HCL 5 MG/5ML IJ SOLN
INTRAMUSCULAR | Status: DC | PRN
  Administered 2021-01-02: 2 mg via INTRAVENOUS

## 2021-01-02 MED ORDER — BUPIVACAINE HCL 0.5 % IJ SOLN
INTRAMUSCULAR | Status: DC | PRN
  Administered 2021-01-02: 30 mL

## 2021-01-02 MED ORDER — LACTATED RINGERS IV SOLN: INTRAVENOUS | Status: DC

## 2021-01-02 SURGICAL SUPPLY — 37 items
BLADE LONG MED 31X9 (MISCELLANEOUS) ×2 IMPLANT
BLADE SURG 10 STRL SS (BLADE) ×3 IMPLANT
BLADE SURG 15 STRL LF DISP TIS (BLADE) IMPLANT
BLADE SURG 15 STRL SS (BLADE) ×3
BNDG ELASTIC 4X5.8 VLCR STR LF (GAUZE/BANDAGES/DRESSINGS) ×3 IMPLANT
BNDG GAUZE ELAST 4 BULKY (GAUZE/BANDAGES/DRESSINGS) ×4 IMPLANT
COVER SURGICAL LIGHT HANDLE (MISCELLANEOUS) ×2 IMPLANT
COVER WAND RF STERILE (DRAPES) ×2 IMPLANT
DRAPE BILATERAL LIMB T (DRAPES) ×1 IMPLANT
DRSG EMULSION OIL 3X3 NADH (GAUZE/BANDAGES/DRESSINGS) ×2 IMPLANT
ELECT REM PT RETURN 9FT ADLT (ELECTROSURGICAL) ×2
ELECTRODE REM PT RTRN 9FT ADLT (ELECTROSURGICAL) ×1 IMPLANT
GAUZE SPONGE 4X4 12PLY STRL (GAUZE/BANDAGES/DRESSINGS) ×3 IMPLANT
GAUZE XEROFORM 5X9 LF (GAUZE/BANDAGES/DRESSINGS) ×2 IMPLANT
GLOVE BIO SURGEON STRL SZ7.5 (GLOVE) ×2 IMPLANT
GLOVE SRG 8 PF TXTR STRL LF DI (GLOVE) ×1 IMPLANT
GLOVE SURG UNDER POLY LF SZ8 (GLOVE) ×1
GOWN STRL REUS W/ TWL LRG LVL3 (GOWN DISPOSABLE) ×1 IMPLANT
GOWN STRL REUS W/ TWL XL LVL3 (GOWN DISPOSABLE) ×1 IMPLANT
GOWN STRL REUS W/TWL LRG LVL3 (GOWN DISPOSABLE) ×1
GOWN STRL REUS W/TWL XL LVL3 (GOWN DISPOSABLE) ×1
KIT BASIN OR (CUSTOM PROCEDURE TRAY) ×2 IMPLANT
KIT TURNOVER KIT B (KITS) ×2 IMPLANT
NDL HYPO 25GX1X1/2 BEV (NEEDLE) ×1 IMPLANT
NEEDLE HYPO 25GX1X1/2 BEV (NEEDLE) ×2 IMPLANT
NS IRRIG 1000ML POUR BTL (IV SOLUTION) ×2 IMPLANT
PACK ORTHO EXTREMITY (CUSTOM PROCEDURE TRAY) ×2 IMPLANT
PAD ARMBOARD 7.5X6 YLW CONV (MISCELLANEOUS) ×2 IMPLANT
SOL PREP POV-IOD 4OZ 10% (MISCELLANEOUS) ×3 IMPLANT
STAPLER VISISTAT 35W (STAPLE) ×1 IMPLANT
SUT ETHILON 3 0 PS 1 (SUTURE) ×3 IMPLANT
SUT MNCRL AB 3-0 PS2 27 (SUTURE) ×1 IMPLANT
SWAB CULTURE ESWAB REG 1ML (MISCELLANEOUS) ×1 IMPLANT
SYR CONTROL 10ML LL (SYRINGE) ×2 IMPLANT
TOWEL GREEN STERILE (TOWEL DISPOSABLE) ×2 IMPLANT
TUBE CONNECTING 12X1/4 (SUCTIONS) ×2 IMPLANT
YANKAUER SUCT BULB TIP NO VENT (SUCTIONS) ×2 IMPLANT

## 2021-01-02 NOTE — Op Note (Signed)
  Patient Name: [name] DOB: Apr 20, 1966  MRN: [id number]   Date of Surgery: 01/02/21  Surgeon: Dr. Hardie Pulley, DPM Assistants: None  Pre-operative Diagnosis:  Ulcers bilateral great toes with necrosis of bone; left foot osteomyelitis Post-operative Diagnosis:  Same Procedures:  1) Amputation Pathology/Specimens: ID Type Source Tests Collected by Time Destination  1 : Left Toe Amputation Toe, Left SURGICAL PATHOLOGY Evelina Bucy, DPM 01/02/2021 1313   2 : Right Toe Amputation Toe, Right SURGICAL PATHOLOGY Evelina Bucy, DPM 01/02/2021 1315   A : LEFT TOE CULTURE Wound Wound GRAM STAIN, AEROBIC/ANAEROBIC CULTURE (SURGICAL/DEEP WOUND) Evelina Bucy, DPM 01/02/2021 1352    Anesthesia: MAC with 30 ccs 0.5% marcaine plain Hemostasis: * Missing tourniquet times found for documented tourniquets in log: [id number] * Estimated Blood Loss: 10 mL Materials: * No implants in log * Medications: 1g Vancomycin powder Complications: none  Indications for Procedure:  This is a 55 y.o. male with ulcers to both great toes with necrosis down to bone. He presents for amputation of the great toes. All risks, benefits, and alternatives of surgery were discussed. No guarantees given.   Procedure in Detail: Patient was identified in pre-operative holding area. Formal consent was signed and both lower extremity was marked. Patient was brought back to the operating room. Anesthesia was induced. The extremity was prepped and draped in the usual sterile fashion. Timeout was taken to confirm patient name, laterality, and procedure prior to incision.   Attention was then directed to the left great toe where an incision was made distal to the MPJ about the ulceration with care to excise the ulcer. Dissection was carried down to level of bone. The proximal phalanx was necrotic and soft at the distal medial aspect. Dissection was continued to the metatarsophalangeal joint and all collateral ligaments were  freed at the joint.  The bone soft tissue attachments of the proximal phalanx were removed and passed for pathology.  The remaining metatarsal head appeared healthy and viable.  The area was copiously irrigated. Vancomycin was applied topically. The skin was reapproximated with nylon and skin staples.  Attention was directed to the right great toe. There was necrosis noted to the bone at the dorsal proximal phalanx. Amputation was repeated for this digit in the same manner as above.   Both feet were then dressed with xeroform, 4x4, kerlix, and ACE. Patient tolerated the procedure well.   Disposition: Following a period of post-operative monitoring, patient will be transferred to the floor.

## 2021-01-02 NOTE — Progress Notes (Signed)
PT Cancellation Note  Patient Details Name: [name] MRN: [id number] DOB: 11-15-1966   Cancelled Treatment:    Reason Eval/Treat Not Completed: Medical issues which prohibited therapy. Pt underwent bilateral great toe amputation today. PT seeking clarification of weightbearing status prior to initiating further mobility.   Arlyss Gandy 01/02/2021, 3:37 PM

## 2021-01-02 NOTE — Anesthesia Procedure Notes (Signed)
Procedure Name: MAC Date/Time: 01/02/2021 1:34 PM Performed by: Candis Shine, CRNA Pre-anesthesia Checklist: Patient identified, Emergency Drugs available, Suction available, Patient being monitored and Timeout performed Patient Re-evaluated:Patient Re-evaluated prior to induction Oxygen Delivery Method: Simple face mask Dental Injury: Teeth and Oropharynx as per pre-operative assessment

## 2021-01-02 NOTE — Transfer of Care (Signed)
Immediate Anesthesia Transfer of Care Note  Patient: [name]  Procedure(s) Performed: AMPUTATION TOE (Bilateral Toe)  Patient Location: PACU  Anesthesia Type:MAC  Level of Consciousness: awake and alert   Airway & Oxygen Therapy: Patient Spontanous Breathing and Patient connected to face mask oxygen  Post-op Assessment: Report given to RN and Post -op Vital signs reviewed and stable  Post vital signs: Reviewed and stable  Last Vitals:  Vitals Value Taken Time  BP 130/96 01/02/21 1431  Temp    Pulse 104 01/02/21 1434  Resp 29 01/02/21 1435  SpO2 98 % 01/02/21 1434  Vitals shown include unvalidated device data.  Last Pain:  Vitals:   01/02/21 1154  TempSrc:   PainSc: 0-No pain         Complications: No complications documented.

## 2021-01-02 NOTE — Progress Notes (Signed)
PT Cancellation Note  Patient Details Name: [name] MRN: [id number] DOB: [birth year]   Cancelled Treatment:    Reason Eval/Treat Not Completed: Patient declined, no reason specified. PT attempts to see patient for mobilization after receiving updated WB orders, WBAT in post-op shoes, over secure chat from Dr. Samuella Cota. PT introduces himself and then pt states "Man I'm eating. They haven't let me eat all day. I'm not going to talk to you because I'm eating". Pt refuses PT session, PT will follow up tomorrow. Of note bilateral post-op shoes were present on sink.   Arlyss Gandy 01/02/2021, 4:59 PM

## 2021-01-02 NOTE — Progress Notes (Signed)
Pt refused CHG baths, but he signed his consent form and has been NPO since midnight, he was educated on its importance and he was in agreement.

## 2021-01-02 NOTE — Interval H&P Note (Signed)
History and Physical Interval Note:  01/02/2021 12:43 PM  [name]  has presented today for surgery, with the diagnosis of Ostoemyelitis.  The various methods of treatment have been discussed with the patient and family. After consideration of risks, benefits and other options for treatment, the patient has consented to  Procedure(s) with comments: AMPUTATION TOE (Bilateral) - Ok to leave patient on hospital bed as a surgical intervention.  The patient's history has been reviewed, patient examined, no change in status, stable for surgery.  I have reviewed the patient's chart and labs.  Questions were answered to the patient's satisfaction.     Park Liter

## 2021-01-02 NOTE — Anesthesia Postprocedure Evaluation (Signed)
Anesthesia Post Note  Patient: [name]  Procedure(s) Performed: AMPUTATION TOE (Bilateral Toe)     Patient location during evaluation: PACU Anesthesia Type: MAC Level of consciousness: awake Pain management: pain level controlled Respiratory status: spontaneous breathing Cardiovascular status: stable Postop Assessment: no apparent nausea or vomiting Anesthetic complications: no   No complications documented.  Last Vitals:  Vitals:   01/02/21 1431 01/02/21 1447  BP: (!) 130/96 111/73  Pulse:    Resp: 19 19  Temp: 36.4 C 36.7 C  SpO2: 100% 99%    Last Pain:  Vitals:   01/02/21 1651  TempSrc:   PainSc: 3                  Soriyah Osberg

## 2021-01-02 NOTE — Progress Notes (Signed)
PROGRESS NOTE    [name]  ONG:[id number] DOB: 11-May-1966 DOA: 12/17/2020 PCP: [name]   Brief Narrative:  Patient is 55 year old male with past medical history of coronary artery disease, chronic systolic CHF, CVA scented with chest pain and became unresponsive with left-sided weakness and left gaze deviation.  Was intubated in the ER.  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  Extubated on 1/20 and transferred to [facility] service on 1/21.   Assessment & Plan:   Acute ischemic stroke: -Reviewed CT head and MRI brain and MRA head and neck -EEG shows diffuse encephalopathy -Continue aspirin and Plavix for 3 weeks and then aspirin alone as per neurology's recommendation -1/25: Due to persistent right-sided weakness and that he claims worsening-repeat CT head and MRI and EEG done which showed no acute findings.  Bilateral great toe ulcers/osteomyelitis noted in left: -Reviewed x-ray results with patient's. -Vascular studies suggestive of adequate perfusion. -Podiatry consulted-underwent bilateral first toe amputation on 01/02/2021 -Monitor H&H and vitals closely.  Appreciate podiatry assistance.  Chronic systolic heart failure with ejection fraction of less than 20% -Patient appears euvolemic on exam. -Continue Coreg, Lasix, Imdur, hydralazine, Entresto, Aldactone -Strict INO's and daily weight -Follow-up with cardiology outpatient-appreciate cardiology's recommendation  Coronary artery disease: -Cardiology recommended outpatient cath at least 1 month after recovery from stroke. -Continue DAPT as per neurology recommendation -Continue aspirin and atorvastatin  Acute metabolic/toxic encephalopathy: -Multifactorial in the setting of stroke, drug use, respiratory failure and sedation -Overall improving. -Continue on CIWA protocol -Continue folic acid, thiamine and  multivitamin -Seen by psychiatry-appreciate help  Hypertension: Stable -Continue Coreg, Lasix, hydralazine, Imdur, Aldactone, Entresto.  Monitor blood pressure closely  Acute liver failure: Improved -Continue to monitor  Cocaine use: Counseled about cessation  Hyponatremia: Resolved  Bipolar disorder/homelessness: -Poor compliance.  Homelessness.  Consulted psychiatry-evaluated and cleared him a danger to himself or to others. -Continue Depakote  DVT prophylaxis: SCD Code Status: Full code Family Communication:  None present at bedside.  Plan of care discussed with patient in length and he verbalized understanding and agreed with it. Disposition Plan: To be determined  Consultants:   Neurology  Cardiology  Podiatry  Procedures:  Bilateral great toe amputation Antimicrobials:   None  Status is: Inpatient  Dispo: The patient is from: Home              Anticipated d/c is to: SNF              Anticipated d/c date is: 2 days              Patient currently is not medically stable to d/c.   Difficult to place patient No         Subjective: Patient seen and examined.  Alert and awake and appears pleasant.  No new complaints.  Remained afebrile.  N.p.o. for the procedure this afternoon.  No acute events overnight  Objective: Vitals:   01/02/21 0634 01/02/21 1228 01/02/21 1431 01/02/21 1447  BP:   (!) 130/96 111/73  Pulse:      Resp:   19 19  Temp:   97.6 F (36.4 C)   TempSrc:      SpO2:   100% 99%  Weight: 78.6 kg 78.6 kg    Height:  5\' 9"  (1.753 m)      Intake/Output Summary (Last 24 hours) at 01/02/2021 1512 Last data filed at 01/02/2021 1420 Gross per 24 hour  Intake 370 ml  Output 610 ml  Net -240 ml   Filed Weights   12/29/20 0526 01/02/21 0634 01/02/21 1228  Weight: 80.2 kg 78.6 kg 78.6 kg    Examination:  General exam: Appears calm and comfortable, on room air, communicating well Respiratory system: Clear to auscultation. Respiratory  effort normal. Cardiovascular system: S1 & S2 heard, RRR. No JVD, murmurs, rubs, gallops or clicks. No pedal edema. Gastrointestinal system: Abdomen is nondistended, soft and nontender. No organomegaly or masses felt. Normal bowel sounds heard. Central nervous system: Alert and oriented. No focal neurological deficits. Extremities:      Psychiatry: Judgement and insight appear normal. Mood & affect appropriate.    Data Reviewed: I have personally reviewed following labs and imaging studies  CBC: Recent Labs  Lab 12/27/20 0304 12/28/20 0534 12/28/20 1437 12/29/20 0102 12/30/20 1734 01/02/21 0400  WBC 6.0 6.2 5.4 5.2 6.3 5.8  NEUTROABS 2.2 2.4  --  1.7 2.5 2.0  HGB 15.1 15.1 15.0 14.7 15.6 15.4  HCT 46.5 44.9 46.5 45.3 46.5 47.3  MCV 95.5 94.1 95.3 95.8 95.3 94.8  PLT 394 414* 418* 411* 419* 338   Basic Metabolic Panel: Recent Labs  Lab 12/28/20 0534 12/28/20 1437 12/29/20 0102 12/30/20 1734 01/02/21 0400  NA 136 138 134* 137 138  K 4.6 4.3 4.1 4.2 3.9  CL 101 102 102 102 103  CO2 23 24 23 24 23   GLUCOSE 111* 184* 184* 123* 103*  BUN 22* 22* 20 17 13   CREATININE 1.02 1.09 1.03 1.08 0.90  CALCIUM 9.3 9.1 9.2 9.3 9.6  MG 2.3 2.1 2.0 2.1 1.9  PHOS 4.5 4.8* 4.7* 4.2 4.5   GFR: Estimated Creatinine Clearance: 96 mL/min (by C-G formula based on SCr of 0.9 mg/dL). Liver Function Tests: Recent Labs  Lab 12/28/20 0534 12/28/20 1437 12/29/20 0102 12/30/20 1734 01/02/21 0400  AST 29 27 24 26 28   ALT 38 36 34 31 25  ALKPHOS 78 81 74 78 66  BILITOT 0.7 0.5 0.9 0.5 0.6  PROT 7.7 7.4 7.4 8.0 7.5  ALBUMIN 2.9* 2.8* 2.8* 3.1* 2.9*   No results for input(s): LIPASE, AMYLASE in the last 168 hours. No results for input(s): AMMONIA in the last 168 hours. Coagulation Profile: No results for input(s): INR, PROTIME in the last 168 hours. Cardiac Enzymes: No results for input(s): CKTOTAL, CKMB, CKMBINDEX, TROPONINI in the last 168 hours. BNP (last 3 results) No results  for input(s): PROBNP in the last 8760 hours. HbA1C: No results for input(s): HGBA1C in the last 72 hours. CBG: Recent Labs  Lab 12/30/20 1148 12/31/20 0736 12/31/20 2314 01/01/21 0751 01/01/21 1929  GLUCAP 165* 105* 170* 119* 157*   Lipid Profile: No results for input(s): CHOL, HDL, LDLCALC, TRIG, CHOLHDL, LDLDIRECT in the last 72 hours. Thyroid Function Tests: No results for input(s): TSH, T4TOTAL, FREET4, T3FREE, THYROIDAB in the last 72 hours. Anemia Panel: No results for input(s): VITAMINB12, FOLATE, FERRITIN, TIBC, IRON, RETICCTPCT in the last 72 hours. Sepsis Labs: No results for input(s): PROCALCITON, LATICACIDVEN in the last 168 hours.  Recent Results (from the past 240 hour(s))  Surgical pcr screen     Status: None   Collection Time: 01/01/21  8:08 PM   Specimen: Nasal Mucosa; Nasal Swab  Result Value Ref Range Status   MRSA, PCR NEGATIVE NEGATIVE Final   Staphylococcus aureus NEGATIVE NEGATIVE Final    Comment: (NOTE) The Xpert SA Assay (FDA approved for NASAL specimens in patients 22 years of  age and older), is one component of a comprehensive surveillance program. It is not intended to diagnose infection nor to guide or monitor treatment. Performed at [facility], [address], [place], [name] [address]       Radiology Studies: No results found.  Scheduled Meds:  (feeding supplement) PROSource Plus  30 mL Oral BID BM   aspirin  81 mg Oral Daily   atorvastatin  40 mg Oral Daily   carvedilol  6.25 mg Oral BID WC   chlorhexidine  15 mL Mouth/Throat NOW   clopidogrel  75 mg Oral Daily   collagenase   Topical Daily   divalproex  500 mg Oral Daily   feeding supplement  237 mL Oral BID BM   folic acid  1 mg Oral Daily   furosemide  40 mg Oral Daily   hydrALAZINE  25 mg Oral TID   isosorbide mononitrate  30 mg Oral Daily   levETIRAcetam  500 mg Oral BID   magnesium oxide  400 mg Oral BID   multivitamin with minerals  1 tablet  Oral Daily   polyethylene glycol  17 g Per Tube Daily   sacubitril-valsartan  1 tablet Oral BID   silver sulfADIAZINE   Topical BID   spironolactone  12.5 mg Oral Daily   thiamine  100 mg Oral Daily   Continuous Infusions:  lactated ringers 10 mL/hr at 01/02/21 1332     LOS: 16 days   Time spent: 35 minutes   Tyana Butzer Estill Cotta, MD [facility]  If 7PM-7AM, please contact night-coverage [email or web address] 01/02/2021, 3:12 PM

## 2021-01-02 NOTE — Progress Notes (Signed)
OT Cancellation Note  Patient Details Name: [name] MRN: [id number] DOB: 1966/01/04   Cancelled Treatment:    Reason Eval/Treat Not Completed: Patient at procedure or test/ unavailable;Other (comment) pt in surgery for toe amputation. Will check back for OT session as pt medically appropriate.   Lenor Derrick., COTA/L Acute Rehabilitation Services [phone]   Barron Schmid 01/02/2021, 1:47 PM

## 2021-01-02 NOTE — Anesthesia Preprocedure Evaluation (Addendum)
Anesthesia Evaluation  Patient identified by MRN, date of birth, ID band Patient awake    Reviewed: Allergy & Precautions, NPO status , Patient's Chart, lab work & pertinent test results, reviewed documented beta blocker date and time   Airway Mallampati: II  TM Distance: >3 FB Neck ROM: Full    Dental no notable dental hx.    Pulmonary Current Smoker and Patient abstained from smoking.,    Pulmonary exam normal breath sounds clear to auscultation       Cardiovascular +CHF  Normal cardiovascular exam Rhythm:Regular Rate:Normal     Neuro/Psych CVA, No Residual Symptoms negative psych ROS   GI/Hepatic negative GI ROS, (+)     substance abuse  cocaine use,   Endo/Other  negative endocrine ROS  Renal/GU negative Renal ROS  negative genitourinary   Musculoskeletal negative musculoskeletal ROS (+)   Abdominal   Peds negative pediatric ROS (+)  Hematology negative hematology ROS (+)   Anesthesia Other Findings Osteomyelitis of toe  Reproductive/Obstetrics negative OB ROS                            Anesthesia Physical Anesthesia Plan  ASA: III  Anesthesia Plan: MAC   Post-op Pain Management:    Induction: Intravenous  PONV Risk Score and Plan: 1 and Propofol infusion, Ondansetron, TIVA and Treatment may vary due to age or medical condition  Airway Management Planned: Natural Airway and Simple Face Mask  Additional Equipment: None  Intra-op Plan:   Post-operative Plan:   Informed Consent: I have reviewed the patients History and Physical, chart, labs and discussed the procedure including the risks, benefits and alternatives for the proposed anesthesia with the patient or authorized representative who has indicated his/her understanding and acceptance.       Plan Discussed with: CRNA  Anesthesia Plan Comments: (Lab Results      Component                Value               Date                       WBC                      5.8                 01/02/2021                HGB                      15.4                01/02/2021                HCT                      47.3                01/02/2021                MCV                      94.8                01/02/2021                PLT  338                 01/02/2021           )       Anesthesia Quick Evaluation

## 2021-01-03 ENCOUNTER — Encounter (HOSPITAL_COMMUNITY): Payer: Self-pay | Admitting: Podiatry

## 2021-01-03 DIAGNOSIS — I63441 Cerebral infarction due to embolism of right cerebellar artery: Secondary | ICD-10-CM | POA: Diagnosis not present

## 2021-01-03 DIAGNOSIS — M86172 Other acute osteomyelitis, left ankle and foot: Secondary | ICD-10-CM | POA: Diagnosis not present

## 2021-01-03 DIAGNOSIS — I5043 Acute on chronic combined systolic (congestive) and diastolic (congestive) heart failure: Secondary | ICD-10-CM | POA: Diagnosis not present

## 2021-01-03 LAB — COMPREHENSIVE METABOLIC PANEL
ALT: 25 U/L (ref 0–44)
AST: 20 U/L (ref 15–41)
Albumin: 3.1 g/dL — ABNORMAL LOW (ref 3.5–5.0)
Alkaline Phosphatase: 63 U/L (ref 38–126)
Anion gap: 10 (ref 5–15)
BUN: 19 mg/dL (ref 6–20)
CO2: 26 mmol/L (ref 22–32)
Calcium: 9.1 mg/dL (ref 8.9–10.3)
Chloride: 101 mmol/L (ref 98–111)
Creatinine, Ser: 1.01 mg/dL (ref 0.61–1.24)
GFR, Estimated: >60 mL/min (ref >60)
Glucose, Bld: 113 mg/dL — ABNORMAL HIGH (ref 70–99)
Potassium: 4.2 mmol/L (ref 3.5–5.1)
Sodium: 137 mmol/L (ref 135–145)
Total Bilirubin: 1 mg/dL (ref 0.3–1.2)
Total Protein: 7.7 g/dL (ref 6.5–8.1)

## 2021-01-03 LAB — CBC
HCT: 40.9 % (ref 39.0–52.0)
Hemoglobin: 13.8 g/dL (ref 13.0–17.0)
MCH: 31.7 pg (ref 26.0–34.0)
MCHC: 33.7 g/dL (ref 30.0–36.0)
MCV: 94 fL (ref 80.0–100.0)
Platelets: 310 10*3/uL (ref 150–400)
RBC: 4.35 MIL/uL (ref 4.22–5.81)
RDW: 13.8 % (ref 11.5–15.5)
WBC: 10.1 10*3/uL (ref 4.0–10.5)
nRBC: 0 % (ref 0.0–0.2)

## 2021-01-03 LAB — GLUCOSE, CAPILLARY
Glucose-Capillary: 89 mg/dL (ref 70–99)
Glucose-Capillary: 73 mg/dL (ref 70–99)
Glucose-Capillary: 97 mg/dL (ref 70–99)
Glucose-Capillary: 96 mg/dL (ref 70–99)

## 2021-01-03 LAB — MAGNESIUM: Magnesium: 1.8 mg/dL (ref 1.7–2.4)

## 2021-01-03 MED ORDER — TRAMADOL HCL 50 MG PO TABS
50.0000 mg | ORAL_TABLET | Freq: Four times a day (QID) | ORAL | Status: DC
  Administered 2021-01-03 – 2021-01-04 (×4): 50 mg via ORAL
  Filled 2021-01-03 (×4): qty 1

## 2021-01-03 MED ORDER — HYDROMORPHONE HCL 1 MG/ML IJ SOLN
0.5000 mg | INTRAMUSCULAR | Status: DC | PRN
  Administered 2021-01-03 – 2021-01-09 (×17): 0.5 mg via INTRAVENOUS
  Filled 2021-01-03 (×19): qty 0.5

## 2021-01-03 MED ORDER — MORPHINE SULFATE (PF) 2 MG/ML IV SOLN
1.0000 mg | Freq: Once | INTRAVENOUS | Status: AC | PRN
Start: 2021-01-03 — End: 2021-01-03
  Administered 2021-01-03: 1 mg via INTRAVENOUS
  Filled 2021-01-03: qty 1

## 2021-01-03 MED ORDER — OXYCODONE HCL 5 MG PO TABS
10.0000 mg | ORAL_TABLET | ORAL | Status: DC | PRN
  Administered 2021-01-03 (×3): 10 mg via ORAL
  Filled 2021-01-03 (×3): qty 2

## 2021-01-03 MED ORDER — DIPHENHYDRAMINE HCL 25 MG PO CAPS
25.0000 mg | ORAL_CAPSULE | Freq: Four times a day (QID) | ORAL | Status: DC | PRN
  Administered 2021-01-03 – 2021-01-05 (×4): 25 mg via ORAL
  Filled 2021-01-03 (×4): qty 1

## 2021-01-03 NOTE — Progress Notes (Signed)
Patient refusing care and meds, other than pain meds from nurse and nurse tech to change linens, bed soiled from moderate blood drainage per foot incision.  Linens and  Dressings on feet bilaterally were changed earlier in the shift, but patient demands staff leave him alone when trying to change bed and dressings for second time. Patient was also offered a warm meal from nursing station in which he refused and then called the police to ask for milk and cookies. Nurse will reattempted to perform dressing changes and monitor patient. The patient has called the police twice since 1900. The patient is telling officer the staff is not giving him what he wants, but when we try to provide care he demands we leave.

## 2021-01-03 NOTE — Progress Notes (Signed)
Occupational Therapy Co-Treatment with PT Patient Details Name: [name] MRN: [id number] DOB: 03-May-1966 Today's Date: 01/03/2021    History of present illness Homeless 55 yo male found down with c/o CP by bystanders. 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 Extubated 1/20. S/p bil great toe amputations 01/02/21.   OT comments  Co-treatment with PT 2/2 patient with increased agitation and pain per RN. Patient premedication prior to start of treatment session with noted 10/10 pain on [employer] Faces scale throughout session. Focus of session on bed mobility, ADL transfers, NMR, and education on WBAT precautions s/p bilateral great toe amputation with bilateral post-op shoes. Patient continues to require significant assistance with ADLs/ADL transfers, and functional mobility indicating continued need for acute OT services. Patient also continues to be limited by decreased cognition, RUE hemiparesis, decreased coordination, decreased sitting/standing balance, decreased safety awareness and ataxia. Given patient CLOF, continued recommendation for SNF rehab.     Follow Up Recommendations  SNF;Supervision/Assistance - 24 hour    Equipment Recommendations  Other (comment) (Defer to next level of care)    Recommendations for Other Services      Precautions / Restrictions Precautions Precautions: Fall Precaution Comments: Agitated, poor safety awareness, 1:1 sitter. Can be verbally abusive. Restrictions Weight Bearing Restrictions: Yes RLE Weight Bearing: Weight bearing as tolerated LLE Weight Bearing: Weight bearing as tolerated Other Position/Activity Restrictions: Bilateral pst-op shoes present in room       Mobility Bed Mobility Overal bed mobility: Needs Assistance Bed Mobility: Rolling;Sidelying  to Sit Rolling: Supervision Sidelying to sit: Min assist;+2 for physical assistance;+2 for safety/equipment       General bed mobility comments: Extra time and repeated cues to roll from R to L to transition sidelying > sit L EOB with use of rails. Pt with increased leg pain needing HHA minA and minA for leg management to avoid bumping obstacles and reduce chances of increased pain.  Transfers Overall transfer level: Needs assistance Equipment used: Rolling walker (2 wheeled) Transfers: Sit to/from [employer] Sit to Stand: Min assist;Mod assist Stand pivot transfers: Mod assist;+2 physical assistance;+2 safety/equipment;Min assist       General transfer comment: Max cues for hand/foot position and sequencing. Patient with poor attention to task and increased acute pain requiring frequent redirection.    Balance Overall balance assessment: Needs assistance Sitting-balance support: Feet supported;Single extremity supported Sitting balance-Leahy Scale: Poor Sitting balance - Comments: Pt rocking trunk sitting EOB with 1-2 UE support, probably due to pain, min guard for safety. 1 LOB to R with minA to recover. Postural control: Posterior lean;Right lateral lean Standing balance support: Bilateral upper extremity supported Standing balance-Leahy Scale: Poor Standing balance comment: reliant on BUE and external support                           ADL either performed or assessed with clinical judgement   ADL       Grooming: Minimal assistance;Sitting               Lower Body Dressing: Moderate assistance;Sit to/from stand Lower Body Dressing Details (indicate cue type and reason): Max A to don surgical shoes in supine 2/2 pain.               General ADL Comments: Pt continues to present  with impaired balance, decreased safety awareness, impaired motor planning and truncal and BUE ataxia     Vision       Perception     Praxis       Cognition Arousal/Alertness: Awake/alert (moments of lethargy and moments of wakefulness) Behavior During Therapy: Impulsive (easily irritated about you "rushing him") Overall Cognitive Status: Impaired/Different from baseline Area of Impairment: Attention;Following commands;Safety/judgement;Awareness;Problem solving;Memory                   Current Attention Level: Selective Memory: Decreased short-term memory;Decreased recall of precautions Following Commands: Follows one step commands with increased time;Follows one step commands inconsistently Safety/Judgement: Decreased awareness of safety;Decreased awareness of deficits Awareness: Emergent Problem Solving: Slow processing;Decreased initiation;Requires verbal cues;Difficulty sequencing;Requires tactile cues General Comments: Pt impulsive and having difficulty maintaining attention to cues, probably due to extreme acute pain in feet this date. Needing max repeated cues to sequence tasks and reminders of proper safe positioning to set-up for transfers.        Exercises     Shoulder Instructions       General Comments BP supine start of session 130/104; BP sitting EOB 124/88 following first sit to stand    Pertinent Vitals/ Pain       Pain Assessment: Faces Faces Pain Scale: Hurts worst Pain Location: bil great toe at surgical incision Pain Descriptors / Indicators: Constant;Discomfort;Grimacing;Guarding;Moaning;Operative site guarding Pain Intervention(s): Limited activity within patient's tolerance;Monitored during session;Premedicated before session;Repositioned  Home Living                                          Prior Functioning/Environment              Frequency  Min 2X/week        Progress Toward Goals  OT Goals(current goals can now be found in the care plan section)  Progress towards OT goals: Progressing toward goals  Acute Rehab OT Goals Patient Stated Goal: to decrease  his pain OT Goal Formulation: With patient ADL Goals Pt Will Perform Eating: Independently;sitting Pt Will Perform Grooming: sitting;standing;with min guard assist Pt Will Perform Upper Body Bathing: with min guard assist;sitting Pt Will Perform Lower Body Bathing: with min assist;sit to/from stand Pt Will Perform Upper Body Dressing: with min guard assist;sitting Pt Will Perform Lower Body Dressing: with min assist;sit to/from stand Pt Will Transfer to Toilet: with min assist;ambulating;bedside commode Pt Will Perform Toileting - Clothing Manipulation and hygiene: with min assist;sit to/from stand Additional ADL Goal #1: Pt will be S in and OOB for basic ADLs Additional ADL Goal #2: Pt will be able to locate all items he needs for task without cues  Plan Discharge plan remains appropriate;Frequency remains appropriate    Co-evaluation      Reason for Co-Treatment: Complexity of the patient's impairments (multi-system involvement);Necessary to address cognition/behavior during functional activity;For patient/therapist safety;To address functional/ADL transfers PT goals addressed during session: Mobility/safety with mobility;Balance;Proper use of DME OT goals addressed during session: ADL's and self-care      AM-PAC OT "6 Clicks" Daily Activity     Outcome Measure   Help from another person eating meals?: A Lot Help from another person taking care of personal grooming?: A Lot Help from another person toileting, which includes using toliet, bedpan, or urinal?: A Lot Help from another person bathing (including washing, rinsing, drying)?: A Lot Help from another person  to put on and taking off regular upper body clothing?: A Little Help from another person to put on and taking off regular lower body clothing?: A Little 6 Click Score: 14    End of Session Equipment Utilized During Treatment: Gait belt;Rolling walker  OT Visit Diagnosis: Unsteadiness on feet (R26.81);Other  abnormalities of gait and mobility (R26.89);Muscle weakness (generalized) (M62.81);Other symptoms and signs involving cognitive function;Hemiplegia and hemiparesis Hemiplegia - Right/Left: Right Hemiplegia - dominant/non-dominant: Dominant Hemiplegia - caused by: Cerebral infarction   Activity Tolerance Patient tolerated treatment well   Patient Left in chair;with call bell/phone within reach;with chair alarm set;with nursing/sitter in room (1;1 sitter)   Nurse Communication Mobility status        Time: 7673-4193 OT Time Calculation (min): 28 min  Charges: OT General Charges $OT Visit: 1 Visit OT Treatments $Self Care/Home Management : 8-22 mins  Chey Cho H. OTR/L Supplemental OT, Department of rehab services [phone]   Millianna Szymborski R H. 01/03/2021, 9:27 AM

## 2021-01-03 NOTE — Progress Notes (Signed)
Nutrition Follow-up  DOCUMENTATION CODES:   Not applicable  INTERVENTION:   Continue Ensure Enlive po BID, each supplement provides 350 kcal and 20 grams of protein   Continue 28ml Prosource Plus po BID, each supplement provides 100 kcals and 15 grams of protein   Continue MVI with minerals daily  NUTRITION DIAGNOSIS:   Inadequate oral intake related to inability to eat as evidenced by NPO status; diet advances; progressing  GOAL:   Patient will meet greater than or equal to 90% of their needs; progressing  MONITOR:   PO intake,Supplement acceptance,Weight trends,I & O's  REASON FOR ASSESSMENT:   Consult,Ventilator Enteral/tube feeding initiation and management  ASSESSMENT:   55 year old male admitted with acute ischemic nonhemorrhagic right superior cerebellar artery territory infarct. PMH of polysubstance abuse, CHF. Pt with Ulcers bilateral great toes with necrosis of bone; left foot osteomyelitis.  1/20 - extubated, diet advanced to regular with thin liquids  1/21 transferred to floor 2/2 - S/p bilateral great toe amputation   Meal completion has been varied from 15-100%. Prior to surgery yesterday, po intake has been mostly 100%. Pt reports pain today. Medication has been given by RN. Pt currently has Ensure and Prosource plus ordered with varied consumption. RD to continue with current orders to aid in caloric and protein needs as well as in healing.   Labs and medications reviewed.   Diet Order:   Diet Order            Diet 2 gram sodium Room service appropriate? Yes; Fluid consistency: Thin  Diet effective now                 EDUCATION NEEDS:   No education needs have been identified at this time  Skin:  Skin Assessment: Skin Integrity Issues: Skin Integrity Issues:: Incisions Incisions: foot  Last BM:  1/30  Height:   Ht Readings from Last 1 Encounters:  01/02/21 5\' 9"  (1.753 m)    Weight:   Wt Readings from Last 1 Encounters:   01/03/21 78.9 kg   BMI:  Body mass index is 25.69 kg/m.  Estimated Nutritional Needs:   Kcal:  2200-2400  Protein:  120-140 grams  Fluid:  >2 L/day  03/03/21, MS, RD, LDN RD pager number/after hours weekend pager number on Amion.

## 2021-01-03 NOTE — Progress Notes (Addendum)
PROGRESS NOTE    [name]  FMB:[id number] DOB: 05-23-1966 DOA: 12/17/2020 PCP: [name]   Brief Narrative:  Patient is 55 year old male with past medical history of coronary artery disease, chronic systolic CHF, CVA scented with chest pain and became unresponsive with left-sided weakness and left gaze deviation.  Was intubated in the ER.  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  Extubated on 1/20 and transferred to [facility] service on 1/21.   Assessment & Plan:   Acute ischemic stroke-right superior and middle cerebellar peduncle -Reviewed CT head and MRI brain and MRA head and neck -EEG shows diffuse encephalopathy -Continue aspirin, statin and Plavix for 3 weeks and then aspirin alone as per neurology's recommendation -1/25: Due to persistent right-sided weakness and that he claims worsening-repeat CT head and MRI and EEG done which showed no acute findings. -A1c: 6.4%, LDL: 72, echo shows ejection fraction less than 20% with grade 2 diastolic dysfunction -Neurology signed off -PT/OT recommended SNF  Bilateral great toe ulcers/osteomyelitis noted in left: -S/p bilateral great toe amputation on 2/2 via podiatry -Monitor H&H and vitals closely.  Appreciate podiatry assistance. -Patient continues to have severe pain-added Dilaudid 0.5 mg as needed for severe pain, oxycodone 10 mg as needed for moderate pain and Dermatol 50 mg every 6 hours scheduled.  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 -Continue Coreg, Lasix, Imdur, hydralazine, Entresto, Aldactone -Strict INO's and daily weight -Follow-up with cardiology outpatient-appreciate cardiology's recommendation  ?  Seizures: -EEG from 1/1 17 shows diffuse encephalopathy,  nonspecific etiology.  EEG from 1/19 showed evidence of epileptogenic city arising from right more than left frontal region. -EEG on 1/26 shows no seizures or epileptiform discharges were seen throughout the recording. -Continue Keppra 500 mg twice daily as per neurology's recommendations  Coronary artery disease: -Cardiology recommended outpatient cath at least 1 month after recovery from stroke. -Continue DAPT -Continue aspirin and atorvastatin  Acute metabolic/toxic encephalopathy: -Multifactorial in the setting of stroke, drug use, respiratory failure and sedation -Overall improving. -Continue on CIWA protocol -Continue folic acid, thiamine and multivitamin -Seen by psychiatry-appreciate help  Hypertension: Blood pressure 140s/100s-likely secondary to pain. -Continue Coreg, Lasix, hydralazine, Imdur, Aldactone, Entresto.  Monitor blood pressure closely  Acute liver failure: Improved -Continue to monitor  Cocaine use: Counseled about cessation  Hyponatremia: Resolved  Bipolar disorder/homelessness: -Poor compliance.  Homelessness.  Consulted psychiatry-evaluated and cleared him a danger to himself or to others. -Continue Depakote  Hyperlipidemia: Continue statin.  DVT prophylaxis: SCD Code Status: Full code Family Communication:  None present at bedside.  Plan of care discussed with patient in length and he verbalized understanding and agreed with it. Disposition Plan: SNF-this will be challenging as patient is homeless and has lots of behavioral issues.  Consultants:   Neurology  Cardiology  Podiatry  Palliative  Procedures:  Bilateral great toe amputation Antimicrobials:   None  Status is: Inpatient  Dispo: The patient is from: Home              Anticipated d/c is to: SNF              Anticipated d/c date is: 2 days              Patient currently is not medically stable to d/c.   Difficult to place patient  No         Subjective: Patient seen  and examined.  Moaning in pain.  Sitter at the bedside.  Tells me that he has 10 out of 10 pain in his feet.  He was given morphine IV once earlier this morning with no improvement.  Remained afebrile.  Other than pain he denies any other symptoms.  Objective: Vitals:   01/03/21 0730 01/03/21 0845 01/03/21 0944 01/03/21 1124  BP: (!) 146/105   126/79  Pulse: 100   96  Resp: 20 18 18 18   Temp: 98.1 F (36.7 C)   97.8 F (36.6 C)  TempSrc: Oral   Oral  SpO2: 98%   96%  Weight:      Height:        Intake/Output Summary (Last 24 hours) at 01/03/2021 1335 Last data filed at 01/03/2021 0200 Gross per 24 hour  Intake 370 ml  Output 560 ml  Net -190 ml   Filed Weights   01/02/21 0634 01/02/21 1228 01/03/21 0215  Weight: 78.6 kg 78.6 kg 78.9 kg    Examination:  General exam: Moaning in pain, on room air  respiratory system: Clear to auscultation. Respiratory effort normal. Cardiovascular system: S1 & S2 heard, RRR. No JVD, murmurs, rubs, gallops or clicks. No pedal edema. Gastrointestinal system: Abdomen is nondistended, soft and nontender. No organomegaly or masses felt. Normal bowel sounds heard. Central nervous system: Alert and oriented. No focal neurological deficits. Extremities: Dressing dry and intact in both feet.   Data Reviewed: I have personally reviewed following labs and imaging studies  CBC: Recent Labs  Lab 12/28/20 0534 12/28/20 1437 12/29/20 0102 12/30/20 1734 01/02/21 0400 01/03/21 0821  WBC 6.2 5.4 5.2 6.3 5.8 10.1  NEUTROABS 2.4  --  1.7 2.5 2.0  --   HGB 15.1 15.0 14.7 15.6 15.4 13.8  HCT 44.9 46.5 45.3 46.5 47.3 40.9  MCV 94.1 95.3 95.8 95.3 94.8 94.0  PLT 414* 418* 411* 419* 338 310   Basic Metabolic Panel: Recent Labs  Lab 12/28/20 0534 12/28/20 1437 12/29/20 0102 12/30/20 1734 01/02/21 0400 01/03/21 0821  NA 136 138 134* 137 138 137  K 4.6 4.3 4.1 4.2 3.9 4.2  CL 101 102 102 102 103 101  CO2 23 24 23 24 23 26   GLUCOSE 111* 184* 184*  123* 103* 113*  BUN 22* 22* 20 17 13 19   CREATININE 1.02 1.09 1.03 1.08 0.90 1.01  CALCIUM 9.3 9.1 9.2 9.3 9.6 9.1  MG 2.3 2.1 2.0 2.1 1.9 1.8  PHOS 4.5 4.8* 4.7* 4.2 4.5  --    GFR: Estimated Creatinine Clearance: 85.6 mL/min (by C-G formula based on SCr of 1.01 mg/dL). Liver Function Tests: Recent Labs  Lab 12/28/20 1437 12/29/20 0102 12/30/20 1734 01/02/21 0400 01/03/21 0821  AST 27 24 26 28 20   ALT 36 34 31 25 25   ALKPHOS 81 74 78 66 63  BILITOT 0.5 0.9 0.5 0.6 1.0  PROT 7.4 7.4 8.0 7.5 7.7  ALBUMIN 2.8* 2.8* 3.1* 2.9* 3.1*   No results for input(s): LIPASE, AMYLASE in the last 168 hours. No results for input(s): AMMONIA in the last 168 hours. Coagulation Profile: No results for input(s): INR, PROTIME in the last 168 hours. Cardiac Enzymes: No results for input(s): CKTOTAL, CKMB, CKMBINDEX, TROPONINI in the last 168 hours. BNP (last 3 results) No results for input(s): PROBNP in the last 8760 hours. HbA1C: No results for input(s): HGBA1C in the last 72 hours. CBG: Recent Labs  Lab 12/31/20 2314 01/01/21 0751 01/01/21 1929 01/02/21 1937 01/03/21 1211  GLUCAP 170* 119* 157* 187* 89   Lipid Profile: No results for input(s): CHOL, HDL, LDLCALC, TRIG, CHOLHDL, LDLDIRECT in the last 72 hours. Thyroid Function Tests: No results for input(s): TSH, T4TOTAL, FREET4, T3FREE, THYROIDAB in the last 72 hours. Anemia Panel: No results for input(s): VITAMINB12, FOLATE, FERRITIN, TIBC, IRON, RETICCTPCT in the last 72 hours. Sepsis Labs: No results for input(s): PROCALCITON, LATICACIDVEN in the last 168 hours.  Recent Results (from the past 240 hour(s))  Surgical pcr screen     Status: None   Collection Time: 01/01/21  8:08 PM   Specimen: Nasal Mucosa; Nasal Swab  Result Value Ref Range Status   MRSA, PCR NEGATIVE NEGATIVE Final   Staphylococcus aureus NEGATIVE NEGATIVE Final    Comment: (NOTE) The Xpert SA Assay (FDA approved for NASAL specimens in patients 22 years of  age and older), is one component of a comprehensive surveillance program. It is not intended to diagnose infection nor to guide or monitor treatment. Performed at [facility], [address], [place], [name] [address]   Aerobic/Anaerobic Culture (surgical/deep wound)     Status: None (Preliminary result)   Collection Time: 01/02/21  1:52 PM   Specimen: Wound  Result Value Ref Range Status   Specimen Description WOUND  Final   Special Requests LEFT TOE SPEC A  Final   Gram Stain   Final    NO WBC SEEN FEW GRAM POSITIVE COCCI RARE GRAM NEGATIVE RODS Performed at [facility], [address] [place], [name] [address]    Culture FEW GRAM NEGATIVE RODS  Final   Report Status PENDING  Incomplete      Radiology Studies: DG Foot 2 Views Left  Result Date: 01/02/2021 CLINICAL DATA:  Status post amputation of the left great toe. EXAM: LEFT FOOT - 2 VIEW COMPARISON:  12/30/2020 FINDINGS: Amputation of the right great toe. No complicating features are identified. The first metatarsal is intact. The other bony structures are intact. Small calcaneal heel spur noted. IMPRESSION: 1. Amputation of the right great toe. 2. No complicating features. Electronically Signed   By: Rudie Meyer M.D.   On: 01/02/2021 16:01   DG Foot 2 Views Right  Result Date: 01/02/2021 CLINICAL DATA:  Right great toe amputation. EXAM: RIGHT FOOT - 2 VIEW COMPARISON:  Radiographs 12/30/2020 FINDINGS: Status post amputation of the right great toe. No complicating features are identified. The other bony structures are intact. IMPRESSION: Status post amputation of the right great toe. Electronically Signed   By: Rudie Meyer M.D.   On: 01/02/2021 16:02    Scheduled Meds: . (feeding supplement) PROSource Plus  30 mL Oral BID BM  . aspirin  81 mg Oral Daily  . atorvastatin  40 mg Oral Daily  . carvedilol  6.25 mg Oral BID WC  . clopidogrel  75 mg Oral Daily  . collagenase   Topical Daily  . divalproex  500  mg Oral Daily  . feeding supplement  237 mL Oral BID BM  . folic acid  1 mg Oral Daily  . furosemide  40 mg Oral Daily  . hydrALAZINE  25 mg Oral TID  . isosorbide mononitrate  30 mg Oral Daily  . levETIRAcetam  500 mg Oral BID  . magnesium oxide  400 mg Oral BID  . multivitamin with minerals  1 tablet Oral Daily  . polyethylene glycol  17 g Per Tube  Daily  . sacubitril-valsartan  1 tablet Oral BID  . silver sulfADIAZINE   Topical BID  . spironolactone  12.5 mg Oral Daily  . thiamine  100 mg Oral Daily  . traMADol  50 mg Oral Q6H   Continuous Infusions: . lactated ringers Stopped (01/02/21 1552)     LOS: 17 days   Time spent: 35 minutes   Jazzlene Huot Estill Cotta, MD [facility]  If 7PM-7AM, please contact night-coverage [email or web address] 01/03/2021, 1:35 PM

## 2021-01-03 NOTE — Progress Notes (Signed)
Patient called the [employer] second time, this time they sent the [facility] The security to notify the patient of calling under false tenses and recommended he allow the the staff to help him. The patient asked the nurse to leave and security insisted I stay so all parties can understand the expectations for care and results of calling police when they are not really needed. Also the guard informed patient that if he called the police for false circumstances  he could be charged. The patient listened and stated "well what is a couple nights in jail, tell them to come on". The patient began to shake in pain once the officer left and yell out. Prior to the guard arriving the provider was contacted to address changing the pain medication. Patient had removed IV, Charge nurse replaced IV , N.O. for morphine was given, bed changed, feet dressing changed. Will continue to montor for safety and pain.

## 2021-01-03 NOTE — Progress Notes (Signed)
Physical Therapy Treatment Patient Details Name: [name] MRN: [id number] DOB: 1966-05-27 Today's Date: 01/03/2021    History of Present Illness Homeless 55 yo male found down with c/o CP by bystanders. 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 Extubated 1/20. S/p bil great toe amputations 01/02/21.    PT Comments    Treated pt in conjunction with OT this date secondary to noted increased agitation per chart review and likely inability to tolerate 2 sessions secondary to pain s/p bil great toe amputations 01/02/21. Both post-op shoes in room upon arrival. Pt continuously rocking and moaning due to pain following surgery thus BP readings may be inaccurate and pt could benefit from checking BP with positional changes in future sessions, see General Comments for BP changes below. Progression with mobility limited by pain, also placing him at high risk for falls. However, pt is making progress only needing minA to come to stand with RW and min-modAx2 to stand step to the R with a RW this date. Will continue to follow acutely. Current recommendations remain appropriate.   Follow Up Recommendations  SNF;Supervision/Assistance - 24 hour     Equipment Recommendations  Other (comment) (TBA)    Recommendations for Other Services       Precautions / Restrictions Precautions Precautions: Fall Precaution Comments: pt easily agitated due to psych h/o, is very unsafe; s/p bil great toe amputation 01/02/21 Restrictions Weight Bearing Restrictions: Yes RLE Weight Bearing: Weight bearing as tolerated LLE Weight Bearing: Weight bearing as tolerated Other Position/Activity Restrictions: Post-op shoes present in room    Mobility  Bed Mobility Overal bed mobility: Needs Assistance Bed Mobility: Rolling;Sidelying to  Sit Rolling: Supervision Sidelying to sit: Min assist;+2 for physical assistance;+2 for safety/equipment       General bed mobility comments: Extra time and repeated cues to roll from R to L to transition sidelying > sit L EOB with use of rails. Pt with increased leg pain needing HHA minA and minA for leg management to avoid bumping obstacles and reduce chances of increased pain.  Transfers Overall transfer level: Needs assistance Equipment used: Rolling walker (2 wheeled) Transfers: Sit to/from [employer] Sit to Stand: Min assist;Mod assist Stand pivot transfers: Mod assist;+2 physical assistance;+2 safety/equipment;Min assist       General transfer comment: Repeated cues to bring legs posterior as he tends to position them anteriorly to avoid weight on anterior aspect of feet. Cued pt to place R hand on RW and L on bed to prevent ataxic movements in R UE from impacting his safety, success. Prorgessed from needing modA first rep to minA second sit to stand rep from EOB > RW. Initially needing minAx2 for balance, RW management, and sequencing of steps to R to transfer to recliner but as pt's pain and fatigue increased he needed modAx1 with minAx1 to prevent R lateral and posterior lean. Trunk flexed throughout.  Ambulation/Gait Ambulation/Gait assistance: Mod assist;+2 physical assistance;+2 safety/equipment;Min assist Gait Distance (Feet): 2 Feet Assistive device: Rolling walker (2 wheeled) Gait Pattern/deviations: Step-through pattern;Decreased weight shift to left;Decreased step length - right;Decreased step length - left;Decreased stride length;Ataxic;Trunk flexed Gait velocity: reduced Gait velocity interpretation: <1.31 ft/sec, indicative of household ambulator General Gait Details: Needing assistance to manage RW to take side steps and turn to transfer EOB > recliner going towards R, starting with only minAx2  but needing up to modAx1 with minAx1 towards end due to  fatigue and pain causing increased R lateral and posterior lean.   Stairs             Wheelchair Mobility    Modified Rankin (Stroke Patients Only) Modified Rankin (Stroke Patients Only) Pre-Morbid Rankin Score: No symptoms Modified Rankin: Moderately severe disability     Balance Overall balance assessment: Needs assistance Sitting-balance support: Feet supported;Single extremity supported Sitting balance-Leahy Scale: Poor Sitting balance - Comments: Pt rocking trunk sitting EOB with 1-2 UE support, probably due to pain, min guard for safety. 1 LOB to R with minA to recover. Postural control: Posterior lean;Right lateral lean Standing balance support: Bilateral upper extremity supported Standing balance-Leahy Scale: Poor Standing balance comment: reliant on BUE and external support                            Cognition Arousal/Alertness: Awake/alert (moments of lethargy and moments of wakefulness) Behavior During Therapy: Impulsive (easily irritated about you "rushing him") Overall Cognitive Status: Impaired/Different from baseline Area of Impairment: Attention;Following commands;Safety/judgement;Awareness;Problem solving;Memory                   Current Attention Level: Selective Memory: Decreased short-term memory;Decreased recall of precautions Following Commands: Follows one step commands with increased time;Follows one step commands inconsistently Safety/Judgement: Decreased awareness of safety;Decreased awareness of deficits Awareness: Emergent Problem Solving: Slow processing;Decreased initiation;Requires verbal cues;Difficulty sequencing;Requires tactile cues General Comments: Pt impulsive and having difficulty maintaining attention to cues, probably due to extreme acute pain in feet this date. Needing max repeated cues to sequence tasks and reminders of proper safe positioning to set-up for transfers.      Exercises      General Comments  General comments (skin integrity, edema, etc.): BP supine start of session 130/104; BP sitting EOB 124/88 following first sit to stand      Pertinent Vitals/Pain Pain Assessment: Faces Faces Pain Scale: Hurts worst Pain Location: bil great toe surgical site Pain Descriptors / Indicators: Constant;Discomfort;Grimacing;Guarding;Moaning;Operative site guarding Pain Intervention(s): Limited activity within patient's tolerance;Monitored during session;Repositioned (pt rocking in pain)    Home Living                      Prior Function            PT Goals (current goals can now be found in the care plan section) Acute Rehab PT Goals Patient Stated Goal: to decrease his pain PT Goal Formulation: With patient Time For Goal Achievement: 01/17/21 Potential to Achieve Goals: Good Progress towards PT goals: Progressing toward goals    Frequency    Min 3X/week      PT Plan Current plan remains appropriate    Co-evaluation PT/OT/SLP Co-Evaluation/Treatment: Yes Reason for Co-Treatment: Complexity of the patient's impairments (multi-system involvement);Necessary to address cognition/behavior during functional activity;For patient/therapist safety;To address functional/ADL transfers PT goals addressed during session: Mobility/safety with mobility;Balance;Proper use of DME        AM-PAC PT "6 Clicks" Mobility   Outcome Measure  Help needed turning from your back to your side while in a flat bed without using bedrails?: A Little Help needed moving from lying on your back to sitting on the side of a flat bed without using bedrails?: A Little Help needed moving to and from a bed to a chair (including a wheelchair)?: A Lot Help needed standing up from a chair using your  arms (e.g., wheelchair or bedside chair)?: A Lot Help needed to walk in hospital room?: A Lot Help needed climbing 3-5 steps with a railing? : A Lot 6 Click Score: 14    End of Session Equipment Utilized  During Treatment: Gait belt Activity Tolerance: Patient limited by pain Patient left: with call bell/phone within reach;in chair;with chair alarm set;with nursing/sitter in room Nurse Communication: Mobility status PT Visit Diagnosis: Unsteadiness on feet (R26.81);Other symptoms and signs involving the nervous system (R29.898);Other abnormalities of gait and mobility (R26.89);Ataxic gait (R26.0);Difficulty in walking, not elsewhere classified (R26.2)     Time: 0822-0850 PT Time Calculation (min) (ACUTE ONLY): 28 min  Charges:  $Therapeutic Activity: 8-22 mins                     Raymond Gurney, PT, DPT Acute Rehabilitation Services  Pager: [phone] Office: [phone]    Jewel Baize 01/03/2021, 9:10 AM

## 2021-01-03 NOTE — Progress Notes (Signed)
Patient experiencing severe itching after taking OxyCODONE 10mg , provider notified and placed order for Antihistamine 25mg , which was effective. Will continue to monitor.

## 2021-01-03 NOTE — Progress Notes (Signed)
  Speech Language Pathology Treatment: Cognitive-Linquistic  Patient Details Name: [name] MRN: [id number] DOB: 1966/09/06 Today's Date: 01/03/2021 Time: 1779-3903 SLP Time Calculation (min) (ACUTE ONLY): 26 min  Assessment / Plan / Recommendation Clinical Impression  Continued follow up for diet tolerance. Pt s/p recent bilateral toe amputation and acute pain. Pt consumed thin liquids via straw without overt s/sx of aspiration. Pt reported he was hungry. When preference food was brought, he declined to eat, stating he will try later despite encouragement. Continue regular thin liquid diet as tolerated.   Cognitive status remains impaired. This date, pain likely a factor. Reinforced functional problem solving in environment including orientation to call light. Pts psychiatric hx also challenging component to maximize recovery. Reinforced temporal orientation including environmental visual aids. Pt requiring moderate to maximal cues.      HPI HPI: 55 yr old [place] with [place] and became unresponsive with left-sided weakness and left gaze deviation. 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 Intubated 1/17-1/20.      SLP Plan  Continue with current plan of care       Recommendations  Diet recommendations: Regular;Thin liquid Liquids provided via: Cup;Straw Medication Administration: Whole meds with puree Supervision: Patient able to self feed;Staff to assist with self feeding;Intermittent supervision to cue for compensatory strategies Compensations: Minimize environmental distractions;Slow rate;Small sips/bites Postural Changes and/or Swallow Maneuvers: Seated upright 90 degrees;Upright 30-60 min after meal                Oral Care Recommendations: Oral care BID Follow up  Recommendations: Skilled Nursing facility SLP Visit Diagnosis: Cognitive communication deficit (R41.841);Dysphagia, unspecified (R13.10) Plan: Continue with current plan of care       GO                Ardyth Gal MA, CCC-SLP Acute Rehabilitation Services   01/03/2021, 2:57 PM

## 2021-01-03 NOTE — Progress Notes (Signed)
Pt refused blood sugar check.

## 2021-01-04 DIAGNOSIS — M86671 Other chronic osteomyelitis, right ankle and foot: Secondary | ICD-10-CM | POA: Diagnosis not present

## 2021-01-04 DIAGNOSIS — I639 Cerebral infarction, unspecified: Secondary | ICD-10-CM | POA: Diagnosis not present

## 2021-01-04 DIAGNOSIS — I5043 Acute on chronic combined systolic (congestive) and diastolic (congestive) heart failure: Secondary | ICD-10-CM | POA: Diagnosis not present

## 2021-01-04 DIAGNOSIS — F319 Bipolar disorder, unspecified: Secondary | ICD-10-CM | POA: Diagnosis not present

## 2021-01-04 DIAGNOSIS — F1410 Cocaine abuse, uncomplicated: Secondary | ICD-10-CM | POA: Diagnosis present

## 2021-01-04 DIAGNOSIS — E785 Hyperlipidemia, unspecified: Secondary | ICD-10-CM | POA: Diagnosis present

## 2021-01-04 DIAGNOSIS — M86672 Other chronic osteomyelitis, left ankle and foot: Secondary | ICD-10-CM

## 2021-01-04 DIAGNOSIS — I2510 Atherosclerotic heart disease of native coronary artery without angina pectoris: Secondary | ICD-10-CM | POA: Diagnosis present

## 2021-01-04 DIAGNOSIS — Z5900 Homelessness unspecified: Secondary | ICD-10-CM

## 2021-01-04 DIAGNOSIS — G9341 Metabolic encephalopathy: Secondary | ICD-10-CM | POA: Diagnosis present

## 2021-01-04 DIAGNOSIS — K7200 Acute and subacute hepatic failure without coma: Secondary | ICD-10-CM | POA: Diagnosis present

## 2021-01-04 DIAGNOSIS — I10 Essential (primary) hypertension: Secondary | ICD-10-CM | POA: Diagnosis present

## 2021-01-04 DIAGNOSIS — M86172 Other acute osteomyelitis, left ankle and foot: Secondary | ICD-10-CM | POA: Diagnosis not present

## 2021-01-04 LAB — CBC
HCT: 40.7 % (ref 39.0–52.0)
Hemoglobin: 13.4 g/dL (ref 13.0–17.0)
MCH: 30.7 pg (ref 26.0–34.0)
MCHC: 32.9 g/dL (ref 30.0–36.0)
MCV: 93.3 fL (ref 80.0–100.0)
Platelets: 270 10*3/uL (ref 150–400)
RBC: 4.36 MIL/uL (ref 4.22–5.81)
RDW: 13.9 % (ref 11.5–15.5)
WBC: 9.2 10*3/uL (ref 4.0–10.5)
nRBC: 0 % (ref 0.0–0.2)

## 2021-01-04 LAB — MAGNESIUM: Magnesium: 1.8 mg/dL (ref 1.7–2.4)

## 2021-01-04 LAB — GLUCOSE, CAPILLARY
Glucose-Capillary: 120 mg/dL — ABNORMAL HIGH (ref 70–99)
Glucose-Capillary: 160 mg/dL — ABNORMAL HIGH (ref 70–99)
Glucose-Capillary: 114 mg/dL — ABNORMAL HIGH (ref 70–99)
Glucose-Capillary: 125 mg/dL — ABNORMAL HIGH (ref 70–99)
Glucose-Capillary: 126 mg/dL — ABNORMAL HIGH (ref 70–99)

## 2021-01-04 MED ORDER — OXYCODONE HCL ER 10 MG PO T12A
20.0000 mg | EXTENDED_RELEASE_TABLET | Freq: Two times a day (BID) | ORAL | Status: DC
  Administered 2021-01-04 – 2021-01-15 (×20): 20 mg via ORAL
  Filled 2021-01-04 (×22): qty 2

## 2021-01-04 MED ORDER — OXYCODONE HCL 5 MG PO TABS
5.0000 mg | ORAL_TABLET | ORAL | Status: DC | PRN
  Administered 2021-01-05 – 2021-02-03 (×42): 5 mg via ORAL
  Filled 2021-01-04 (×48): qty 1

## 2021-01-04 MED ORDER — PREGABALIN 75 MG PO CAPS
75.0000 mg | ORAL_CAPSULE | Freq: Three times a day (TID) | ORAL | Status: DC
  Administered 2021-01-04 – 2021-01-07 (×7): 75 mg via ORAL
  Filled 2021-01-04 (×8): qty 1

## 2021-01-04 MED ORDER — ENOXAPARIN SODIUM 40 MG/0.4ML ~~LOC~~ SOLN
40.0000 mg | SUBCUTANEOUS | Status: DC
  Administered 2021-01-04 – 2021-01-17 (×9): 40 mg via SUBCUTANEOUS
  Filled 2021-01-04 (×16): qty 0.4

## 2021-01-04 MED ORDER — ISOSORBIDE DINITRATE 5 MG PO TABS
5.0000 mg | ORAL_TABLET | Freq: Three times a day (TID) | ORAL | Status: DC
  Administered 2021-01-04 – 2021-02-02 (×54): 5 mg via ORAL
  Filled 2021-01-04 (×91): qty 1

## 2021-01-04 NOTE — Progress Notes (Signed)
[facility] PROGRESS NOTE  [name] LGX:[id number] DOB: Sep 22, 1966 DOA: 12/17/2020 PCP: [name]  Status: Remains inpatient appropriate because:Ongoing active pain requiring inpatient pain management, Unsafe d/c plan and IV treatments appropriate due to intensity of illness or inability to take PO   Dispo:  The patient is from: homeless              Anticipated d/c is to: homeless shelter or streets-patient plans on taking a bus from [place] to [place] and living with "friends" after arrival              Anticipated d/c date is: > 3 days              Patient currently is not medically stable to d/c.  Barriers to discharge: Patient is homeless and currently has significant gait and ambulation issues secondary to recent bilateral amputation of toes.  This is further complicated by inadequate pain control.   Difficult to place patient Yes   Level of care: Med-Surg  Code Status: Full Family Communication: Patient only DVT prophylaxis: Lovenox Vaccination status: Does not appear to be vaccinated against Covid  Foley catheter: No  HPI: 55 year old male with past medical history of coronary artery disease, chronic systolic CHF, CVA scented with chest pain and became unresponsive with left-sided weakness and left gaze deviation.  Was intubated in the ER.  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  Extubated on 1/20 and transferred to [facility] service on 1/21.  Subjective: Awake.  Reassured patient we would be focusing on improved pain control for his amputation sites given recent surgical intervention.  Patient agreeable to regimen as recommended.  No other complaints.  Discussed with patient discharge plans and discharge disposition which he clarified.  Objective: Vitals:   01/04/21 0338 01/04/21 0809  BP: 111/82 95/77  Pulse: 82 83  Resp: 16   Temp: 98.3 F  (36.8 C)   SpO2: 95%     Intake/Output Summary (Last 24 hours) at 01/04/2021 0830 Last data filed at 01/04/2021 0344 Gross per 24 hour  Intake --  Output 200 ml  Net -200 ml   Filed Weights   01/02/21 1228 01/03/21 0215 01/04/21 0338  Weight: 78.6 kg 78.9 kg 80 kg    Exam:  Constitutional: Alert, calm.  No acute distress Respiratory: clear to auscultation bilateral. Normal respiratory effort. No accessory muscle use.  Cardiovascular: Regular rate and rhythm, no murmurs / rubs / gallops. No extremity edema.  Abdomen: no tenderness, no masses palpated. No hepatosplenomegaly. Bowel sounds positive.  Skin: Bilateral toe amputation site covered with dressings with no obvious drainage or bleeding Neurologic: CN 2-12 grossly intact. Sensation intact, DTR normal. Strength 5/5 x all 4 extremities.  Psychiatric: Alert and oriented x 3. Normal mood.    Assessment/Plan: Acute problems: Acute ischemic stroke-right superior and middle cerebellar peduncle -Reviewed CT head and MRI brain and MRA head and neck -EEG shows diffuse encephalopathy -Continue aspirin, statin and Plavix for 3 weeks and then aspirin alone as per neurology's recommendation -1/25: Due to persistent right-sided weakness and that he claims worsening-repeat CT head and MRI and EEG done which showed no acute findings. -A1c: 6.4%, LDL: 72, echo shows ejection fraction less than 20% with grade 2 diastolic dysfunction -Neurology signed off -PT/OT recommended SNF  Bilateral great toe ulcers/osteomyelitis noted in left/status post multiple toe amputations: -S/p bilateral great toe amputation on 2/2 via podiatry -2/4  resume DVT prophylaxis with Lovenox -Patient continues to have severe pain unrelieved by OxyIR 10 mg as needed as well as IV Dilaudid for severe pain. 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  Seizures: -EEG from 1/1 17 shows diffuse encephalopathy, nonspecific etiology.  EEG from 1/19 showed evidence of epileptogenic city arising from right more than left frontal region. -EEG on 1/26 shows no seizures or epileptiform discharges were seen throughout the recording. -Continue Keppra 500 mg twice daily as per neurology's recommendations  Coronary artery disease: -Cardiology recommended outpatient cath at least 1 month after recovery from stroke. -Continue DAPT -Continue aspirin and atorvastatin    Other problems: Acute metabolic/toxic encephalopathy: -Multifactorial in the setting of stroke, drug use, respiratory failure and sedation -Resolved -Continue folic acid, thiamine and multivitamin -Seen by psychiatry-appreciate help  Hypertension:  Blood pressure 140s/100s-likely secondary to pain with significant improvement in hypertension with improved pain control as above. -Continue Coreg, Lasix, Aldactone, Entresto.  Note as above hydralazine discontinued on 2/4 due to suboptimal blood pressure readings and Imdur changed to shorter acting isosorbide with hold parameters  -Monitor blood pressure closely  Acute transaminitis -LFTs as of 2/3 are within normal limits except for low albumin of 3.1 -Elevated LFTs likely related to shock liver phases of admission the mild elevation of total bilirubin to 1.7 therefore not  consistent with acute liver failure  Cocaine use:  Counseled about cessation  Hyponatremia:  Resolved  Bipolar disorder/homelessness: -Poor compliance.  Homelessness.   Consulted psychiatry-evaluated and cleared him a danger to himself or to others. -Continue Depakote  Hyperlipidemia:  Continue statin.    Data Reviewed: Basic Metabolic Panel: Recent Labs  Lab 12/28/20 1437 12/29/20 0102 12/30/20 1734 01/02/21 0400 01/03/21 0821 01/04/21 0440  NA 138 134* 137 138 137  --   K 4.3 4.1 4.2 3.9 4.2  --   CL 102 102 102 103 101  --   CO2 24 23 24 23 26   --   GLUCOSE 184* 184* 123* 103* 113*  --   BUN 22* 20 17 13 19   --   CREATININE 1.09 1.03 1.08 0.90 1.01  --   CALCIUM 9.1 9.2 9.3 9.6 9.1  --   MG 2.1 2.0 2.1 1.9 1.8 1.8  PHOS 4.8* 4.7* 4.2 4.5  --   --    Liver Function Tests: Recent Labs  Lab 12/28/20 1437 12/29/20 0102 12/30/20 1734 01/02/21 0400 01/03/21 0821  AST 27 24 26 28 20   ALT 36 34 31 25 25   ALKPHOS 81 74 78 66 63  BILITOT 0.5 0.9 0.5 0.6 1.0  PROT 7.4 7.4 8.0 7.5 7.7  ALBUMIN 2.8* 2.8* 3.1* 2.9* 3.1*   No results for input(s): LIPASE, AMYLASE in the last 168 hours. No results for input(s): AMMONIA in the last 168 hours. CBC: Recent Labs  Lab 12/29/20 0102 12/30/20 1734 01/02/21 0400 01/03/21 0821 01/04/21 0440  WBC 5.2  6.3 5.8 10.1 9.2  NEUTROABS 1.7 2.5 2.0  --   --   HGB 14.7 15.6 15.4 13.8 13.4  HCT 45.3 46.5 47.3 40.9 40.7  MCV 95.8 95.3 94.8 94.0 93.3  PLT 411* 419* 338 310 270   Cardiac Enzymes: No results for input(s): CKTOTAL, CKMB, CKMBINDEX, TROPONINI in the last 168 hours. BNP (last 3 results) No results for input(s): BNP in the last 8760 hours.  ProBNP (last 3 results) No results for input(s): PROBNP in the last 8760 hours.  CBG: Recent Labs  Lab 01/03/21 1549 01/03/21 1918 01/03/21 2312 01/04/21 0338 01/04/21 0816  GLUCAP 73 97 96 120* 160*    Recent Results (from the past 240 hour(s))  Surgical pcr  screen     Status: None   Collection Time: 01/01/21  8:08 PM   Specimen: Nasal Mucosa; Nasal Swab  Result Value Ref Range Status   MRSA, PCR NEGATIVE NEGATIVE Final   Staphylococcus aureus NEGATIVE NEGATIVE Final    Comment: (NOTE) The Xpert SA Assay (FDA approved for NASAL specimens in patients 40 years of age and older), is one component of a comprehensive surveillance program. It is not intended to diagnose infection nor to guide or monitor treatment. Performed at [facility], [address], [place], [name] [address]   Aerobic/Anaerobic Culture (surgical/deep wound)     Status: None (Preliminary result)   Collection Time: 01/02/21  1:52 PM   Specimen: Wound  Result Value Ref Range Status   Specimen Description WOUND  Final   Special Requests LEFT TOE SPEC A  Final   Gram Stain   Final    NO WBC SEEN FEW GRAM POSITIVE COCCI RARE GRAM NEGATIVE RODS Performed at [facility], [address], [place], [name] [address]    Culture FEW PROTEUS VULGARIS  Final   Report Status PENDING  Incomplete     Studies: DG Foot 2 Views Left  Result Date: 01/02/2021 CLINICAL DATA:  Status post amputation of the left great toe. EXAM: LEFT FOOT - 2 VIEW COMPARISON:  12/30/2020 FINDINGS: Amputation of the right great toe. No complicating features are identified. The first metatarsal is intact. The other bony structures are intact. Small calcaneal heel spur noted. IMPRESSION: 1. Amputation of the right great toe. 2. No complicating features. Electronically Signed   By: Rudie Meyer M.D.   On: 01/02/2021 16:01   DG Foot 2 Views Right  Result Date: 01/02/2021 CLINICAL DATA:  Right great toe amputation. EXAM: RIGHT FOOT - 2 VIEW COMPARISON:  Radiographs 12/30/2020 FINDINGS: Status post amputation of the right great toe. No complicating features are identified. The other bony structures are intact. IMPRESSION: Status post amputation of the right great toe. Electronically Signed   By: Rudie Meyer M.D.   On: 01/02/2021 16:02    Scheduled Meds:  (feeding supplement) PROSource Plus  30 mL Oral BID BM   aspirin  81 mg Oral Daily   atorvastatin  40 mg Oral Daily   carvedilol  6.25 mg Oral BID WC   clopidogrel  75 mg Oral Daily   collagenase   Topical Daily   divalproex  500 mg Oral Daily   feeding supplement  237 mL Oral BID BM   folic acid  1 mg Oral Daily   furosemide  40 mg Oral Daily   hydrALAZINE  25 mg Oral TID   isosorbide mononitrate  30 mg Oral Daily   levETIRAcetam  500 mg Oral BID  magnesium oxide  400 mg Oral BID   multivitamin with minerals  1 tablet Oral Daily   oxyCODONE  20 mg Oral Q12H   polyethylene glycol  17 g Per Tube Daily   pregabalin  75 mg Oral TID   sacubitril-valsartan  1 tablet Oral BID   silver sulfADIAZINE   Topical BID   spironolactone  12.5 mg Oral Daily   thiamine  100 mg Oral Daily   Continuous Infusions:  lactated ringers Stopped (01/02/21 1552)    Active Problems:   Stroke (HCC)   Basilar artery embolus   Acute on chronic combined systolic and diastolic CHF (congestive heart failure) (HCC)   Respiratory failure (HCC)   Ulcer of great toe, left, with necrosis of bone (HCC)   Ulcer of great toe, right, with necrosis of muscle (HCC)   Acute osteomyelitis of toe, left (HCC)   Osteomyelitis of great toe [facility])   Consultants:  Neurology  Cardiology  Podiatry  Palliative  Procedures: Bilateral great toe amputation  Antibiotics: Anti-infectives (From admission, onward)   Start     Dose/Rate Route Frequency Ordered Stop   01/02/21 1406  vancomycin (VANCOCIN) powder  Status:  Discontinued          As needed 01/02/21 1406 01/02/21 1428   12/17/20 1450  ceFAZolin (ANCEF) 2-4 GM/100ML-% IVPB       Note to Pharmacy: Cristy Friedlander   : cabinet override      12/17/20 1450 12/17/20 1911        Time spent: 40 minutes    Junious Silk ANP  [facility] 7 am - 330 pm/M-F for direct  patient care and secure chat Please refer to Amion for contact info 18  days

## 2021-01-04 NOTE — Progress Notes (Signed)
Physical Therapy Treatment Patient Details Name: [name] MRN: [id number] DOB: 1966-10-04 Today's Date: 01/04/2021    History of Present Illness Homeless 55 yo male found down with c/o CP by bystanders. 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 Extubated 1/20. S/p bil great toe amputations 01/02/21.    PT Comments    Pt making progress with mobility, able to tolerate standing with increased ease this date compared to previous session. However, pt continues to display R ataxic movements and decreased cognition in regards to attention, safety awareness, and sequencing that place him at risk for falls. He required modAx1 to come to stand and modAx1 with intermittent minAx1 from sitter due to his ataxic unsafe movements and poor ability to follow commands to sequence management of RW then stepping laterally to L towards HOB. He also tends to push his RW distal anteriorly to him despite cues. After standing pt's face went blank and he was not following directions or responding to questions as he had been, RN notified of this. BP taken in supine after resting following standing bout and then again in sitting with him displaying symptomatic orthostatic hypotension (he reported being dizzy), RN and MD notified. Will continue to follow acutely. Current recommendations remain appropriate.  Follow Up Recommendations  SNF;Supervision/Assistance - 24 hour     Equipment Recommendations  Other (comment) (TBA)    Recommendations for Other Services       Precautions / Restrictions Precautions Precautions: Fall Precaution Comments: orthostatic hypotension; pt easily agitated due to psych h/o, is very unsafe; s/p bil great toe amputation 01/02/21 Restrictions Weight Bearing Restrictions: Yes RLE Weight Bearing: Weight  bearing as tolerated LLE Weight Bearing: Weight bearing as tolerated Other Position/Activity Restrictions: Post-op shoes present in room    Mobility  Bed Mobility Overal bed mobility: Needs Assistance Bed Mobility: Supine to Sit;Rolling;Sit to Supine Rolling: Supervision   Supine to sit: Min guard;Min assist Sit to supine: Min guard   General bed mobility comments: Extra time and repeated cues to remain on task throughout. Min guard to come to sit EOB 1st rep but minA bil HHA 2nd rep as pt less alert. Min guard for other bed mob for safety.  Transfers Overall transfer level: Needs assistance Equipment used: Rolling walker (2 wheeled) Transfers: Sit to/from Stand Sit to Stand: Mod assist         General transfer comment: Cued pt to place R hand on RW and L on bed to prevent ataxic movements in R UE from impacting his safety, success. VCs provided to place feet more posterior under body for transfer. ModA and extra time to power up to stand.  Ambulation/Gait Ambulation/Gait assistance: Mod assist;+2 physical assistance;+2 safety/equipment;Min assist Gait Distance (Feet): 3 Feet Assistive device: Rolling walker (2 wheeled) Gait Pattern/deviations: Decreased weight shift to left;Decreased step length - right;Decreased step length - left;Decreased stride length;Ataxic;Trunk flexed;Decreased weight shift to right Gait velocity: reduced Gait velocity interpretation: <1.31 ft/sec, indicative of household ambulator General Gait Details: Several side steps standing at EOB going towards L with RW, cuing for moving RW laterally to L but pt tends to instead push it far anteriorly. Cues provided to shift weight and sequence steps. ModAx1 from PT and intermittent minA for sitter due to pt's ataxic unsafe movements and poor RW positioning. Difficulty stepping posteriorly.   Stairs  Wheelchair Mobility    Modified Rankin (Stroke Patients Only) Modified Rankin (Stroke  Patients Only) Pre-Morbid Rankin Score: No symptoms Modified Rankin: Moderately severe disability     Balance Overall balance assessment: Needs assistance Sitting-balance support: Feet supported;Single extremity supported Sitting balance-Leahy Scale: Poor Sitting balance - Comments: Sitting EOB with UE support and min guard for safety with trunk sway/rocking noted. Postural control: Posterior lean Standing balance support: Bilateral upper extremity supported Standing balance-Leahy Scale: Poor Standing balance comment: reliant on BUE and external support                            Cognition Arousal/Alertness: Awake/alert (moments of lethargy and moments of wakefulness) Behavior During Therapy: Impulsive (easily irritated about you "rushing him") Overall Cognitive Status: Impaired/Different from baseline Area of Impairment: Attention;Following commands;Safety/judgement;Awareness;Problem solving;Memory                   Current Attention Level: Selective Memory: Decreased short-term memory;Decreased recall of precautions Following Commands: Follows one step commands with increased time;Follows one step commands inconsistently Safety/Judgement: Decreased awareness of safety;Decreased awareness of deficits Awareness: Emergent Problem Solving: Slow processing;Decreased initiation;Requires verbal cues;Difficulty sequencing;Requires tactile cues General Comments: Pt impulsive and having difficulty maintaining attention to cues, needing them to be repeated often. Cues to sequence tasks and reminders of proper safe positioning to set-up for transfers. Wavering from pleasant to agitated.      Exercises      General Comments General comments (skin integrity, edema, etc.): BP 131/78 supine after resting several minutes following standing bout then BP 95/80 sitting EOB; reports of dizziness coming to sit and following standing with increased blank expression and difficulty  responding verbally to questions      Pertinent Vitals/Pain Pain Assessment: Faces Faces Pain Scale: Hurts even more Pain Location: bil great toe surgical site; head Pain Descriptors / Indicators: Constant;Discomfort;Grimacing;Guarding;Moaning;Operative site guarding Pain Intervention(s): Limited activity within patient's tolerance;Monitored during session;Repositioned    Home Living                      Prior Function            PT Goals (current goals can now be found in the care plan section) Acute Rehab PT Goals Patient Stated Goal: to improve PT Goal Formulation: With patient Time For Goal Achievement: 01/17/21 Potential to Achieve Goals: Good Progress towards PT goals: Progressing toward goals    Frequency    Min 3X/week      PT Plan Current plan remains appropriate    Co-evaluation              AM-PAC PT "6 Clicks" Mobility   Outcome Measure  Help needed turning from your back to your side while in a flat bed without using bedrails?: A Little Help needed moving from lying on your back to sitting on the side of a flat bed without using bedrails?: A Little Help needed moving to and from a bed to a chair (including a wheelchair)?: A Lot Help needed standing up from a chair using your arms (e.g., wheelchair or bedside chair)?: A Lot Help needed to walk in hospital room?: A Lot Help needed climbing 3-5 steps with a railing? : A Lot 6 Click Score: 14    End of Session Equipment Utilized During Treatment: Gait belt Activity Tolerance: Treatment limited secondary to medical complications (Comment) (orthostatic hypotension) Patient left: with call bell/phone within reach;with nursing/sitter in room;in  bed;with bed alarm set Nurse Communication: Mobility status;Other (comment) (BP changes and symptoms) PT Visit Diagnosis: Unsteadiness on feet (R26.81);Other symptoms and signs involving the nervous system (R29.898);Other abnormalities of gait and  mobility (R26.89);Ataxic gait (R26.0);Difficulty in walking, not elsewhere classified (R26.2)     Time: 1631-1700 PT Time Calculation (min) (ACUTE ONLY): 29 min  Charges:  $Gait Training: 8-22 mins $Therapeutic Activity: 8-22 mins                     Raymond Gurney, PT, DPT Acute Rehabilitation Services  Pager: [phone] Office: [phone]    Jewel Baize 01/04/2021, 5:12 PM

## 2021-01-04 NOTE — Progress Notes (Signed)
  Subjective:  Patient ID: [name], male    DOB: 1966-09-17,  MRN: [id number]  A 55 y.o. male presents with bilateral hallux osteomyelitis status post amputation done on January 02, 2021 primarily closed.  He states that he is having some pain in both of his toes.  His pain is not being controlled with pain medication.  He denies acute complaint.  He has allowed me to take the dressings off and change it up.  He denies any other acute issues.  Objective:   Vitals:   01/04/21 0809 01/04/21 1300  BP: 95/77 97/77  Pulse: 83 89  Resp:    Temp:    SpO2:     General AA&O x3. Normal mood and affect.  Vascular Dorsalis pedis and posterior tibial pulses 2/4 bilat. Brisk capillary refill to all digits. Pedal hair present.  Neurologic Epicritic sensation grossly intact.  Dermatologic  bilateral hallux amputations noted.  Some mild maceration around incision site.  No signs of infection noted no purulent drainage noted.  Staples are holding intact.  No signs of dehiscence noted.  Orthopedic: MMT 5/5 in dorsiflexion, plantarflexion, inversion, and eversion. Normal joint ROM without pain or crepitus.    Assessment & Plan:  Patient was evaluated and treated and all questions answered.  Bilateral hallux osteomyelitis status post amputation postop day 2 -All questions and concerns were discussed with the patient in extensive detail details and answered. -Patient is cleared from podiatric standpoint to be discharged on p.o. antibiotics. -He may need Betadine wet-to-dry dressing changes to address the maceration 3 times a week.  However if he is not able to do so we will can see him in our clinic to continue the dressing changes. -He can be scheduled see Dr. Samuella Cota in 1 week after discharge -Weightbearing as tolerated with surgical shoe to bilateral feet.  Candelaria Stagers, DPM  Accessible via secure chat for questions or concerns.

## 2021-01-04 NOTE — Progress Notes (Signed)
DTP team met with patient at bedside to introduce self and roles. Patient reports prior to hospitalization that he was staying with different friends. Patient reports that after discharge he is traveling to [place] via train to stay with other friends. Patient is two days post operation for toe amputation.  Madilyn Fireman, MSW, [name]-A Transitions of Care  Clinical Social Worker [phone]

## 2021-01-05 DIAGNOSIS — M86172 Other acute osteomyelitis, left ankle and foot: Secondary | ICD-10-CM | POA: Diagnosis not present

## 2021-01-05 DIAGNOSIS — I5043 Acute on chronic combined systolic (congestive) and diastolic (congestive) heart failure: Secondary | ICD-10-CM | POA: Diagnosis not present

## 2021-01-05 DIAGNOSIS — F319 Bipolar disorder, unspecified: Secondary | ICD-10-CM

## 2021-01-05 DIAGNOSIS — G9341 Metabolic encephalopathy: Secondary | ICD-10-CM | POA: Diagnosis not present

## 2021-01-05 DIAGNOSIS — I651 Occlusion and stenosis of basilar artery: Secondary | ICD-10-CM | POA: Diagnosis not present

## 2021-01-05 LAB — GLUCOSE, CAPILLARY
Glucose-Capillary: 119 mg/dL — ABNORMAL HIGH (ref 70–99)
Glucose-Capillary: 132 mg/dL — ABNORMAL HIGH (ref 70–99)
Glucose-Capillary: 121 mg/dL — ABNORMAL HIGH (ref 70–99)

## 2021-01-05 MED ORDER — CIPROFLOXACIN HCL 500 MG PO TABS
500.0000 mg | ORAL_TABLET | Freq: Two times a day (BID) | ORAL | Status: AC
  Administered 2021-01-05 – 2021-01-18 (×26): 500 mg via ORAL
  Filled 2021-01-05 (×27): qty 1

## 2021-01-05 NOTE — Progress Notes (Addendum)
PROGRESS NOTE    [name]  IEP:[id number] DOB: 05/15/1966 DOA: 12/17/2020 PCP: [name]   Brief Narrative:  Patient is 55 year old male with past medical history of coronary artery disease, chronic systolic CHF, CVA scented with chest pain and became unresponsive with left-sided weakness and left gaze deviation.  Was intubated in the ER.  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  Extubated on 1/20 and transferred to [facility] service on 1/21.   Assessment & Plan:  Bilateral great toe ulcers/osteomyelitis: -S/p bilateral great toe amputation on 2/2 by  podiatry.  POD: 3 -Monitor H&H and vitals closely.  Appreciate podiatry assistance. -Continue OxyContin 20 mg every 12 hours, oxycodone 5 mg every 4 hours for breakthrough pain and Dilaudid as needed for severe pain and Lyrica 75 mg 3 times daily for neuropathic pain. -Podiatry recommended p.o. antibiotics-discussed with Dr. Allena Katz via secure chat. -Wound culture: Proteus vulgaris-reviewed susceptibilities-we will start patient on Cipro 500 mg p.o. twice daily for 2 weeks. -Follow-up with podiatry in 1 week after discharge and weightbearing as tolerated with surgical shoe to bilateral feet.  Acute ischemic stroke-right superior and middle cerebellar peduncle -Reviewed CT head and MRI brain and MRA head and neck -EEG shows diffuse encephalopathy -Continue aspirin, statin and Plavix for 3 weeks and then aspirin alone as per neurology's recommendation -1/25: Due to persistent right-sided weakness and that he claims worsening-repeat CT head and MRI and EEG done which showed no acute findings. 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 -Continue Coreg, Lasix, Imdur, hydralazine, Entresto, Aldactone -Strict INO's and daily weight.  Cardiology signed off. -Follow-up with cardiology outpatient-appreciate cardiology's recommendation  ?  Seizures: -EEG from 1/1 17 shows diffuse encephalopathy, nonspecific etiology.  EEG from 1/19 showed evidence of epileptogenic city arising from right more than left frontal region. -EEG on 1/26 shows no seizures or epileptiform discharges were seen throughout the recording. -Continue Keppra 500 mg twice daily as per neurology's recommendations  Coronary artery disease: -Cardiology recommended outpatient cath at least 1 month after recovery from stroke. -Continue DAPT -Continue aspirin and atorvastatin  Acute metabolic/toxic encephalopathy: -Multifactorial in the setting of stroke, drug use, respiratory failure and sedation -Overall improving. -Continue on CIWA protocol -Continue folic acid, thiamine and multivitamin -Seen by psychiatry-appreciate help  Hypertension: Blood pressure 140s/100s-likely secondary to pain. -Continue Coreg, Lasix, hydralazine, Imdur, Aldactone, Entresto.  Monitor blood pressure closely  Acute liver failure: Improved -Continue to monitor  Cocaine use: Counseled about cessation  Hyponatremia: Resolved  Bipolar disorder/homelessness: -Poor compliance.  Homelessness.  Consulted psychiatry-evaluated and cleared him a danger to himself or to others. -Continue Depakote  Hyperlipidemia: Continue statin.  Disposition: PT/OT recommended SNF-however it is challenging as patient is homeless with history of cocaine abuse and behavioral issues-TOC consulted and working on  placement.  DVT prophylaxis: SCD/Lovenox Code Status: Full code Family Communication:  None present at bedside.  Plan of care discussed with patient in length and he verbalized understanding and  agreed with it. Disposition Plan: SNF  Consultants:   Neurology  Cardiology  Podiatry  Palliative  Procedures:  Bilateral great toe amputation Antimicrobials:   None  Status is: Inpatient  Dispo: The patient  is from: Home              Anticipated d/c is to: SNF              Anticipated d/c date is: More than 3 days              Patient currently is not medically stable to d/c.   Difficult to place patient yes   Subjective: Patient seen and examined.  Calm and comfortable and tells me that his pain is better this morning.  Denies any new complaints.  Remained afebrile with no acute events overnight.  Objective: Vitals:   01/04/21 2054 01/05/21 0058 01/05/21 0430 01/05/21 0755  BP: 118/63 (!) 117/91 123/71 124/66  Pulse: 84 84 90 88  Resp: 19 20 19 18   Temp: 99.1 F (37.3 C) 98.6 F (37 C)    TempSrc: Oral Oral    SpO2: 98% 98% 93% 100%  Weight:      Height:        Intake/Output Summary (Last 24 hours) at 01/05/2021 1114 Last data filed at 01/05/2021 1034 Gross per 24 hour  Intake 360 ml  Output 400 ml  Net -40 ml   Filed Weights   01/02/21 1228 01/03/21 0215 01/04/21 0338  Weight: 78.6 kg 78.9 kg 80 kg    Examination:  General exam: Appears calm and comfortable, on room air, communicating well respiratory system: Clear to auscultation. Respiratory effort normal. Cardiovascular system: S1 & S2 heard, RRR. No JVD, murmurs, rubs, gallops or clicks. No pedal edema. Gastrointestinal system: Abdomen is nondistended, soft and nontender. No organomegaly or masses felt. Normal bowel sounds heard. Central nervous system: Alert and oriented.  Communicating well and following commands. Extremities: Dressing dry and intact in both feet.   Data Reviewed: I have personally reviewed following labs and imaging studies  CBC: Recent Labs  Lab 12/30/20 1734 01/02/21 0400 01/03/21 0821 01/04/21 0440  WBC 6.3 5.8 10.1 9.2  NEUTROABS 2.5 2.0  --   --   HGB 15.6 15.4  13.8 13.4  HCT 46.5 47.3 40.9 40.7  MCV 95.3 94.8 94.0 93.3  PLT 419* 338 310 270   Basic Metabolic Panel: Recent Labs  Lab 12/30/20 1734 01/02/21 0400 01/03/21 0821 01/04/21 0440  NA 137 138 137  --   K 4.2 3.9 4.2  --   CL 102 103 101  --   CO2 24 23 26   --   GLUCOSE 123* 103* 113*  --   BUN 17 13 19   --   CREATININE 1.08 0.90 1.01  --   CALCIUM 9.3 9.6 9.1  --   MG 2.1 1.9 1.8 1.8  PHOS 4.2 4.5  --   --    GFR: Estimated Creatinine Clearance: 85.6 mL/min (by C-G formula based on SCr of 1.01 mg/dL). Liver Function Tests: Recent Labs  Lab 12/30/20 1734 01/02/21 0400 01/03/21 0821  AST 26 28 20   ALT 31 25 25   ALKPHOS 78 66 63  BILITOT 0.5 0.6 1.0  PROT 8.0 7.5 7.7  ALBUMIN 3.1* 2.9* 3.1*   No results for input(s): LIPASE, AMYLASE in the last 168 hours. No results for input(s): AMMONIA in the last 168 hours. Coagulation Profile: No results for input(s): INR, PROTIME in the last 168 hours. Cardiac Enzymes: No results for input(s): CKTOTAL, CKMB, CKMBINDEX, TROPONINI in the last 168 hours. BNP (last 3 results) No results for input(s): PROBNP in the last 8760 hours. HbA1C: No results for input(s): HGBA1C in  the last 72 hours. CBG: Recent Labs  Lab 01/04/21 1117 01/04/21 1518 01/04/21 2056 01/05/21 0100 01/05/21 0503  GLUCAP 114* 125* 126* 119* 132*   Lipid Profile: No results for input(s): CHOL, HDL, LDLCALC, TRIG, CHOLHDL, LDLDIRECT in the last 72 hours. Thyroid Function Tests: No results for input(s): TSH, T4TOTAL, FREET4, T3FREE, THYROIDAB in the last 72 hours. Anemia Panel: No results for input(s): VITAMINB12, FOLATE, FERRITIN, TIBC, IRON, RETICCTPCT in the last 72 hours. Sepsis Labs: No results for input(s): PROCALCITON, LATICACIDVEN in the last 168 hours.  Recent Results (from the past 240 hour(s))  Surgical pcr screen     Status: None   Collection Time: 01/01/21  8:08 PM   Specimen: Nasal Mucosa; Nasal Swab  Result Value Ref Range Status    MRSA, PCR NEGATIVE NEGATIVE Final   Staphylococcus aureus NEGATIVE NEGATIVE Final    Comment: (NOTE) The Xpert SA Assay (FDA approved for NASAL specimens in patients 29 years of age and older), is one component of a comprehensive surveillance program. It is not intended to diagnose infection nor to guide or monitor treatment. Performed at [facility], [address], [place], [name] [address]   Aerobic/Anaerobic Culture (surgical/deep wound)     Status: None (Preliminary result)   Collection Time: 01/02/21  1:52 PM   Specimen: Wound  Result Value Ref Range Status   Specimen Description WOUND  Final   Special Requests LEFT TOE SPEC A  Final   Gram Stain   Final    NO WBC SEEN FEW GRAM POSITIVE COCCI RARE GRAM NEGATIVE RODS Performed at [facility], [address] [place], [name] [address]    Culture   Final    FEW PROTEUS VULGARIS MIXED ANAEROBIC FLORA PRESENT.  CALL LAB IF FURTHER IID REQUIRED.    Report Status PENDING  Incomplete   Organism ID, Bacteria PROTEUS VULGARIS  Final      Susceptibility   Proteus vulgaris - MIC*    AMPICILLIN >=32 RESISTANT Resistant     CEFAZOLIN >=64 RESISTANT Resistant     CEFEPIME <=0.12 SENSITIVE Sensitive     CEFTAZIDIME <=1 SENSITIVE Sensitive     CIPROFLOXACIN <=0.25 SENSITIVE Sensitive     GENTAMICIN <=1 SENSITIVE Sensitive     IMIPENEM 8 INTERMEDIATE Intermediate     TRIMETH/SULFA <=20 SENSITIVE Sensitive     AMPICILLIN/SULBACTAM 16 INTERMEDIATE Intermediate     PIP/TAZO <=4 SENSITIVE Sensitive     * FEW PROTEUS VULGARIS      Radiology Studies: No results found.  Scheduled Meds: . (feeding supplement) PROSource Plus  30 mL Oral BID BM  . aspirin  81 mg Oral Daily  . atorvastatin  40 mg Oral Daily  . carvedilol  6.25 mg Oral BID WC  . ciprofloxacin  500 mg Oral BID  . clopidogrel  75 mg Oral Daily  . collagenase   Topical Daily  . divalproex  500 mg Oral Daily  . enoxaparin (LOVENOX) injection  40 mg  Subcutaneous Q24H  . feeding supplement  237 mL Oral BID BM  . folic acid  1 mg Oral Daily  . furosemide  40 mg Oral Daily  . isosorbide dinitrate  5 mg Oral TID  . levETIRAcetam  500 mg Oral BID  . magnesium oxide  400 mg Oral BID  . multivitamin with minerals  1 tablet Oral Daily  . oxyCODONE  20 mg Oral Q12H  . polyethylene glycol  17 g Per Tube Daily  . pregabalin  75 mg Oral TID  . sacubitril-valsartan  1 tablet Oral BID  . silver sulfADIAZINE   Topical BID  . spironolactone  12.5 mg Oral Daily  . thiamine  100 mg Oral Daily   Continuous Infusions: . lactated ringers Stopped (01/02/21 1552)     LOS: 19 days   Time spent: 35 minutes   Kirandeep Fariss Estill Cotta, MD [facility]  If 7PM-7AM, please contact night-coverage [email or web address] 01/05/2021, 11:14 AM

## 2021-01-06 DIAGNOSIS — F319 Bipolar disorder, unspecified: Secondary | ICD-10-CM | POA: Diagnosis not present

## 2021-01-06 DIAGNOSIS — I5043 Acute on chronic combined systolic (congestive) and diastolic (congestive) heart failure: Secondary | ICD-10-CM | POA: Diagnosis not present

## 2021-01-06 DIAGNOSIS — G9341 Metabolic encephalopathy: Secondary | ICD-10-CM | POA: Diagnosis not present

## 2021-01-06 DIAGNOSIS — M86172 Other acute osteomyelitis, left ankle and foot: Secondary | ICD-10-CM | POA: Diagnosis not present

## 2021-01-06 LAB — BASIC METABOLIC PANEL
Anion gap: 11 (ref 5–15)
Anion gap: 12 (ref 5–15)
BUN: 12 mg/dL (ref 6–20)
BUN: 7 mg/dL (ref 6–20)
CO2: 23 mmol/L (ref 22–32)
CO2: 24 mmol/L (ref 22–32)
Calcium: 8.6 mg/dL — ABNORMAL LOW (ref 8.9–10.3)
Calcium: 8.9 mg/dL (ref 8.9–10.3)
Chloride: 100 mmol/L (ref 98–111)
Chloride: 99 mmol/L (ref 98–111)
Creatinine, Ser: 1.03 mg/dL (ref 0.61–1.24)
Creatinine, Ser: 0.89 mg/dL (ref 0.61–1.24)
GFR, Estimated: >60 mL/min (ref >60)
GFR, Estimated: >60 mL/min (ref >60)
Glucose, Bld: 154 mg/dL — ABNORMAL HIGH (ref 70–99)
Glucose, Bld: 148 mg/dL — ABNORMAL HIGH (ref 70–99)
Potassium: 3.9 mmol/L (ref 3.5–5.1)
Potassium: 3.9 mmol/L (ref 3.5–5.1)
Sodium: 134 mmol/L — ABNORMAL LOW (ref 135–145)
Sodium: 135 mmol/L (ref 135–145)

## 2021-01-06 LAB — CBC
HCT: 38.6 % — ABNORMAL LOW (ref 39.0–52.0)
Hemoglobin: 12.5 g/dL — ABNORMAL LOW (ref 13.0–17.0)
MCH: 30.7 pg (ref 26.0–34.0)
MCHC: 32.4 g/dL (ref 30.0–36.0)
MCV: 94.8 fL (ref 80.0–100.0)
Platelets: 231 10*3/uL (ref 150–400)
RBC: 4.07 MIL/uL — ABNORMAL LOW (ref 4.22–5.81)
RDW: 13.9 % (ref 11.5–15.5)
WBC: 8.9 10*3/uL (ref 4.0–10.5)
nRBC: 0 % (ref 0.0–0.2)

## 2021-01-06 LAB — GLUCOSE, CAPILLARY
Glucose-Capillary: 102 mg/dL — ABNORMAL HIGH (ref 70–99)
Glucose-Capillary: 116 mg/dL — ABNORMAL HIGH (ref 70–99)
Glucose-Capillary: 117 mg/dL — ABNORMAL HIGH (ref 70–99)
Glucose-Capillary: 158 mg/dL — ABNORMAL HIGH (ref 70–99)
Glucose-Capillary: 171 mg/dL — ABNORMAL HIGH (ref 70–99)

## 2021-01-06 LAB — MAGNESIUM: Magnesium: 1.8 mg/dL (ref 1.7–2.4)

## 2021-01-06 MED ORDER — MAGNESIUM SULFATE 2 GM/50ML IV SOLN
2.0000 g | Freq: Once | INTRAVENOUS | Status: AC
  Administered 2021-01-06: 2 g via INTRAVENOUS
  Filled 2021-01-06: qty 50

## 2021-01-06 MED ORDER — POTASSIUM CHLORIDE 20 MEQ PO PACK
40.0000 meq | PACK | Freq: Once | ORAL | Status: AC
  Administered 2021-01-08: 40 meq via ORAL
  Filled 2021-01-06: qty 2

## 2021-01-06 NOTE — Progress Notes (Signed)
PROGRESS NOTE    [name]  FBP:[id number] DOB: 12/20/1965 DOA: 12/17/2020 PCP: [name]   Brief Narrative:  Patient is 55 year old male with past medical history of coronary artery disease, chronic systolic CHF, CVA scented with chest pain and became unresponsive with left-sided weakness and left gaze deviation.  Was intubated in the ER.  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  Extubated on 1/20 and transferred to [facility] service on 1/21.   Assessment & Plan:  Bilateral great toe ulcers/osteomyelitis: -S/p bilateral great toe amputation on 2/2 by  podiatry.  POD: 4 -Monitor H&H and vitals closely.  Appreciate podiatry assistance. -Continue OxyContin 20 mg every 12 hours, oxycodone 5 mg every 4 hours for breakthrough pain and Dilaudid as needed for severe pain and Lyrica 75 mg 3 times daily for neuropathic pain. -Podiatry recommended p.o. antibiotics-discussed with Dr. Allena Katz via secure chat. -Wound culture: Proteus vulgaris-reviewed susceptibilities-cont. Cipro 500 mg p.o. twice daily for 2 weeks. -Recommend Follow-up with podiatry in 1 week after discharge and weightbearing as tolerated with surgical shoe to bilateral feet.  Acute ischemic stroke-right superior and middle cerebellar peduncle -Reviewed CT head and MRI brain and MRA head and neck -EEG shows diffuse encephalopathy -Continue aspirin, statin and Plavix for 3 weeks and then aspirin alone as per neurology's recommendation -1/25: Due to persistent right-sided weakness and that he claims worsening-repeat CT head and MRI and EEG done which showed no acute findings. -A1c: 6.4%, LDL: 72, echo shows ejection fraction less than 20% with grade 2 diastolic dysfunction -Neurology signed off -PT/OT recommended SNF  Chronic combined systolic and diastolic heart failure with ejection fraction of less than 20% and grade 2  diastolic dysfunction: -Likely cocaine induced -Patient appears euvolemic on exam. -Continue Coreg, Lasix, Imdur, hydralazine, Entresto, Aldactone -Strict INO's and daily weight.  Cardiology signed off. -Follow-up with cardiology outpatient-appreciate cardiology's recommendation -Keep potassium more than 4, magnesium: More than 2  ?  Seizures: -EEG from 1/1 17 shows diffuse encephalopathy, nonspecific etiology.  EEG from 1/19 showed evidence of epileptogenic city arising from right more than left frontal region. -EEG on 1/26 shows no seizures or epileptiform discharges were seen throughout the recording. -Continue Keppra 500 mg twice daily as per neurology's recommendations  Coronary artery disease: -Cardiology recommended outpatient cath at least 1 month after recovery from stroke. -Continue DAPT -Continue aspirin and atorvastatin  Acute metabolic/toxic encephalopathy: -Multifactorial in the setting of stroke, drug use, respiratory failure and sedation -Overall improving. -Continue on CIWA protocol -Continue folic acid, thiamine and multivitamin -Seen by psychiatry-appreciate help  Hypertension: Blood pressure 140s/100s-likely secondary to pain. -Continue Coreg, Lasix, hydralazine, Imdur, Aldactone, Entresto.  Monitor blood pressure closely  Acute liver failure: Improved -Continue to monitor  Cocaine use: Counseled about cessation  Hyponatremia: Resolved  Bipolar disorder/homelessness: -Poor compliance.  Homelessness.  Consulted psychiatry-evaluated and cleared him a danger to himself or to others. -Continue Depakote  Hyperlipidemia: Continue statin.  Disposition: PT/OT recommended SNF-however it is challenging as patient is homeless with history of cocaine abuse and behavioral issues-TOC consulted and working on  placement.  DVT prophylaxis: SCD/Lovenox Code Status: Full code Family Communication:  None present at bedside.  Plan of care discussed with patient in length  and he verbalized understanding and agreed with it. Disposition Plan: SNF  Consultants:   Neurology  Cardiology  Podiatry  Palliative  Procedures:  Bilateral great toe amputation Antimicrobials:   None  Status  is: Inpatient  Dispo: The patient is from: Home              Anticipated d/c is to: SNF              Anticipated d/c date is: More than 3 days              Patient currently is not medically stable to d/c.   Difficult to place patient yes   Subjective: Patient seen and examined.  Sleeping comfortably but arousable and tells me that he would like to go home as he wants to walk and see how is he doing.  Tells me that his pain is much better, remained afebrile, denies lightheadedness, dizziness, chest pain, shortness of breath, leg swelling, orthopnea or PND.  No acute events overnight.  Objective: Vitals:   01/06/21 0025 01/06/21 0333 01/06/21 0500 01/06/21 0736  BP: 121/63 (!) 118/92  134/86  Pulse: 92 84  84  Resp: 20 20  18   Temp: 98.2 F (36.8 C) 98.4 F (36.9 C)    TempSrc: Oral Oral    SpO2: 98% 96%    Weight:   82.8 kg   Height:        Intake/Output Summary (Last 24 hours) at 01/06/2021 1011 Last data filed at 01/05/2021 2312 Gross per 24 hour  Intake 240 ml  Output 1175 ml  Net -935 ml   Filed Weights   01/03/21 0215 01/04/21 0338 01/06/21 0500  Weight: 78.9 kg 80 kg 82.8 kg    Examination:  General exam: Appears calm and comfortable, on room air, communicating well respiratory system: Clear to auscultation. Respiratory effort normal. Cardiovascular system: S1 & S2 heard, RRR. No JVD, murmurs, rubs, gallops or clicks. No pedal edema. Gastrointestinal system: Abdomen is nondistended, soft and nontender. No organomegaly or masses felt. Normal bowel sounds heard. Central nervous system: Sleepy but arousable.  Communicating well and following commands. Extremities: Dressing dry and intact in both feet.   Data Reviewed: I have personally reviewed  following labs and imaging studies  CBC: Recent Labs  Lab 12/30/20 1734 01/02/21 0400 01/03/21 0821 01/04/21 0440 01/06/21 0208  WBC 6.3 5.8 10.1 9.2 8.9  NEUTROABS 2.5 2.0  --   --   --   HGB 15.6 15.4 13.8 13.4 12.5*  HCT 46.5 47.3 40.9 40.7 38.6*  MCV 95.3 94.8 94.0 93.3 94.8  PLT 419* 338 310 270 231   Basic Metabolic Panel: Recent Labs  Lab 12/30/20 1734 01/02/21 0400 01/03/21 0821 01/04/21 0440 01/06/21 0208  NA 137 138 137  --  134*  K 4.2 3.9 4.2  --  3.9  CL 102 103 101  --  100  CO2 24 23 26   --  23  GLUCOSE 123* 103* 113*  --  154*  BUN 17 13 19   --  12  CREATININE 1.08 0.90 1.01  --  1.03  CALCIUM 9.3 9.6 9.1  --  8.6*  MG 2.1 1.9 1.8 1.8 1.8  PHOS 4.2 4.5  --   --   --    GFR: Estimated Creatinine Clearance: 83.9 mL/min (by C-G formula based on SCr of 1.03 mg/dL). Liver Function Tests: Recent Labs  Lab 12/30/20 1734 01/02/21 0400 01/03/21 0821  AST 26 28 20   ALT 31 25 25   ALKPHOS 78 66 63  BILITOT 0.5 0.6 1.0  PROT 8.0 7.5 7.7  ALBUMIN 3.1* 2.9* 3.1*   No results for input(s): LIPASE, AMYLASE in the last 168 hours.  No results for input(s): AMMONIA in the last 168 hours. Coagulation Profile: No results for input(s): INR, PROTIME in the last 168 hours. Cardiac Enzymes: No results for input(s): CKTOTAL, CKMB, CKMBINDEX, TROPONINI in the last 168 hours. BNP (last 3 results) No results for input(s): PROBNP in the last 8760 hours. HbA1C: No results for input(s): HGBA1C in the last 72 hours. CBG: Recent Labs  Lab 01/04/21 2056 01/05/21 0100 01/05/21 0503 01/05/21 2104 01/06/21 0753  GLUCAP 126* 119* 132* 121* 117*   Lipid Profile: No results for input(s): CHOL, HDL, LDLCALC, TRIG, CHOLHDL, LDLDIRECT in the last 72 hours. Thyroid Function Tests: No results for input(s): TSH, T4TOTAL, FREET4, T3FREE, THYROIDAB in the last 72 hours. Anemia Panel: No results for input(s): VITAMINB12, FOLATE, FERRITIN, TIBC, IRON, RETICCTPCT in the last 72  hours. Sepsis Labs: No results for input(s): PROCALCITON, LATICACIDVEN in the last 168 hours.  Recent Results (from the past 240 hour(s))  Surgical pcr screen     Status: None   Collection Time: 01/01/21  8:08 PM   Specimen: Nasal Mucosa; Nasal Swab  Result Value Ref Range Status   MRSA, PCR NEGATIVE NEGATIVE Final   Staphylococcus aureus NEGATIVE NEGATIVE Final    Comment: (NOTE) The Xpert SA Assay (FDA approved for NASAL specimens in patients 39 years of age and older), is one component of a comprehensive surveillance program. It is not intended to diagnose infection nor to guide or monitor treatment. Performed at [facility], [address], [place], [name] [address]   Aerobic/Anaerobic Culture (surgical/deep wound)     Status: None (Preliminary result)   Collection Time: 01/02/21  1:52 PM   Specimen: Wound  Result Value Ref Range Status   Specimen Description WOUND  Final   Special Requests LEFT TOE SPEC A  Final   Gram Stain   Final    NO WBC SEEN FEW GRAM POSITIVE COCCI RARE GRAM NEGATIVE RODS Performed at [facility], [address], [place], [name] [address]    Culture   Final    FEW PROTEUS VULGARIS MIXED ANAEROBIC FLORA PRESENT.  CALL LAB IF FURTHER IID REQUIRED.    Report Status PENDING  Incomplete   Organism ID, Bacteria PROTEUS VULGARIS  Final      Susceptibility   Proteus vulgaris - MIC*    AMPICILLIN >=32 RESISTANT Resistant     CEFAZOLIN >=64 RESISTANT Resistant     CEFEPIME <=0.12 SENSITIVE Sensitive     CEFTAZIDIME <=1 SENSITIVE Sensitive     CIPROFLOXACIN <=0.25 SENSITIVE Sensitive     GENTAMICIN <=1 SENSITIVE Sensitive     IMIPENEM 8 INTERMEDIATE Intermediate     TRIMETH/SULFA <=20 SENSITIVE Sensitive     AMPICILLIN/SULBACTAM 16 INTERMEDIATE Intermediate     PIP/TAZO <=4 SENSITIVE Sensitive     * FEW PROTEUS VULGARIS      Radiology Studies: No results found.  Scheduled Meds: . (feeding supplement) PROSource Plus  30 mL Oral  BID BM  . aspirin  81 mg Oral Daily  . atorvastatin  40 mg Oral Daily  . carvedilol  6.25 mg Oral BID WC  . ciprofloxacin  500 mg Oral BID  . clopidogrel  75 mg Oral Daily  . collagenase   Topical Daily  . divalproex  500 mg Oral Daily  . enoxaparin (LOVENOX) injection  40 mg Subcutaneous Q24H  . feeding supplement  237 mL Oral BID BM  . folic acid  1 mg Oral Daily  . furosemide  40 mg  Oral Daily  . isosorbide dinitrate  5 mg Oral TID  . levETIRAcetam  500 mg Oral BID  . magnesium oxide  400 mg Oral BID  . multivitamin with minerals  1 tablet Oral Daily  . oxyCODONE  20 mg Oral Q12H  . polyethylene glycol  17 g Per Tube Daily  . pregabalin  75 mg Oral TID  . sacubitril-valsartan  1 tablet Oral BID  . silver sulfADIAZINE   Topical BID  . spironolactone  12.5 mg Oral Daily  . thiamine  100 mg Oral Daily   Continuous Infusions: . lactated ringers Stopped (01/02/21 1552)     LOS: 20 days   Time spent: 35 minutes   Alma Muegge Estill Cotta, MD [facility]  If 7PM-7AM, please contact night-coverage [email or web address] 01/06/2021, 10:11 AM

## 2021-01-06 NOTE — Progress Notes (Signed)
Patient complaining of intermittent dizziness while lying and sitting. No other new symptoms, neuro assessment is at baseline. MD notified. Nurse attempted to recheck vitals but patient refused.

## 2021-01-07 DIAGNOSIS — Z5900 Homelessness unspecified: Secondary | ICD-10-CM | POA: Diagnosis not present

## 2021-01-07 DIAGNOSIS — M86172 Other acute osteomyelitis, left ankle and foot: Secondary | ICD-10-CM | POA: Diagnosis not present

## 2021-01-07 DIAGNOSIS — I5043 Acute on chronic combined systolic (congestive) and diastolic (congestive) heart failure: Secondary | ICD-10-CM | POA: Diagnosis not present

## 2021-01-07 DIAGNOSIS — I639 Cerebral infarction, unspecified: Secondary | ICD-10-CM | POA: Diagnosis not present

## 2021-01-07 LAB — AEROBIC/ANAEROBIC CULTURE W GRAM STAIN (SURGICAL/DEEP WOUND): Gram Stain: NONE SEEN

## 2021-01-07 LAB — GLUCOSE, CAPILLARY: Glucose-Capillary: 158 mg/dL — ABNORMAL HIGH (ref 70–99)

## 2021-01-07 LAB — SURGICAL PATHOLOGY

## 2021-01-07 LAB — MAGNESIUM: Magnesium: 2.1 mg/dL (ref 1.7–2.4)

## 2021-01-07 NOTE — Progress Notes (Addendum)
Pt refused all HS meds, pt educated on the importance of taking his meds, which he refused all education. Dr. Loney Loh informed

## 2021-01-07 NOTE — Progress Notes (Signed)
  Speech Language Pathology Treatment: Cognitive-Linquistic  Patient Details Name: [name] MRN: [id number] DOB: 12-22-1965 Today's Date: 01/07/2021 Time: 1497-0263 SLP Time Calculation (min) (ACUTE ONLY): 8 min  Assessment / Plan / Recommendation Clinical Impression  Pt declined his lunch tray at bedside, without any evidence of prior consumption. Per review of chart, his lungs remain clear and he has stayed afebrile, suggesting that he may be on most appropriate diet. Will continue to f/u for swallowing briefly as we continue to follow for cognitive goals. Session today focused primarily on cognition, with Mod-Max cues provided by SLP for verbal and functional problem solving. He becomes easily frustrated when tasks are challenging for him, but then he also becomes less participatory. Pt also has very limited awareness of cognitive and communicative status, repeatedly telling SLP that he was just "fine." Attempted to engage him in other functional tasks but pt adamantly told SLP to come back another time. Will continue to follow as able.   HPI HPI: 55 yr old [place] with [place] and became unresponsive with left-sided weakness and left gaze deviation. 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 Intubated 1/17-1/20.      SLP Plan  Continue with current plan of care       Recommendations                   Follow up Recommendations: Skilled Nursing facility SLP Visit Diagnosis: Cognitive communication deficit (R41.841);Dysphagia, unspecified (R13.10) Plan: Continue with current plan of care       GO                Mahala Menghini., M.A. CCC-SLP Acute Rehabilitation Services Pager [phone] Office [phone]  01/07/2021, 1:36 PM

## 2021-01-07 NOTE — TOC Benefit Eligibility Note (Signed)
Transition of Care [facility]) Benefit Eligibility Note    Patient Details  Name: [name] MRN: [id number] Date of Birth: Oct 12, 1966                           Additional Notes: PT'T HAS MEDICAID OF [place] : RFF-DATE 12-01-2020   CO-PAY- $4.00 FOR EACH PRESCRIPTION    Mardene Sayer Phone Number: 01/07/2021, 10:49 AM

## 2021-01-07 NOTE — TOC Progression Note (Signed)
Transition of Care [facility]) - Progression Note    Patient Details  Name: [name] MRN: [id number] Date of Birth: Oct 25, 1966  Transition of Care [facility]) CM/SW Contact  Curlene Labrum, RN Phone Number: 01/07/2021, 10:45 AM  Clinical Narrative:    Case management met with the patient at the bedside regarding transition of care.  The patient states that he is having unbearable pain in bilateral feet due to recent surgery S/P bilateral great toe amputations.  The patient plans to discharge home to stay with friends after discharge from the hospital.  The patient declined SNF placement and also, patient has history of drug and alcohol abuse and would, most likely, not have a bed offered to him considering his substance abuse history.  The patient states that he plans to take a bus to [place] in the near future and move out of the area.  I sent benefit's checks through Agricultural engineer with TOC to determine if pre-authorization for meds are needed - including Lyrica and Oxycodone 12 hour tablets.  Will follow up and notify Erin Hearing, NP in case new form of medication is needed for discharge.  Patient was unable to participate in PT this morning due to increased pain issues.  I messaged PT to determine what home equipment he would need for this week.  I ordered the RW and WC and placed a dme note.  I plan to follow up with [employer] dme to order the equipment closer to discharge to home.  CM and MSW will continue to follow the patient for discharge to home once pain controlled and medically ready for discharge.  Patient is homeless.   Expected Discharge Plan: [place] Barriers to Discharge: Homeless with medical needs,Continued Medical Work up  Expected Discharge Plan and Services Expected Discharge Plan: [place] In-house Referral: Clinical Social Work Discharge Planning Services: CM Consult,Indigent Health Clinic,Medication Assistance Post Acute  Care Choice: Durable Medical Equipment Living arrangements for the past 2 months: Homeless,No permanent address                 DME Arranged: Walker rolling,Wheelchair manual DME Agency: [employer]     Representative spoke with at DME Agency: Will contact [employer] to order and deliver when closer to discharge to home.             Social Determinants of Health (SDOH) Interventions    Readmission Risk Interventions Readmission Risk Prevention Plan 01/07/2021  Transportation Screening Complete  PCP or Specialist Appt within 3-5 Days Complete  HRI or [place] Complete  Social Work Consult for [place] Planning/Counseling Complete  Palliative Care Screening Complete  Medication Review Press photographer) Complete

## 2021-01-07 NOTE — Progress Notes (Addendum)
[facility] PROGRESS NOTE  [name] DGL:[id number] DOB: 11-Mar-1966 DOA: 12/17/2020 PCP: [name]  Status: Remains inpatient appropriate because:Ongoing active pain requiring inpatient pain management, Unsafe d/c plan and IV treatments appropriate due to intensity of illness or inability to take PO   Dispo:  The patient is from: homeless              Anticipated d/c is to: homeless shelter or streets-patient plans on taking a bus from [place] to [place] and living with "friends" after arrival              Anticipated d/c date is: > 3 days              Patient currently is not medically stable to d/c.  Barriers to discharge: Patient is homeless and currently has significant gait and ambulation issues secondary to recent bilateral amputation of toes.  This is further complicated by inadequate pain control.  And although patient has Medicaid he has a significant substance abuse history and therefore is unlikely to be accepted by nursing homes in the area.   Difficult to place patient Yes   Level of care: Med-Surg  Code Status: Full Family Communication: Patient only DVT prophylaxis: Lovenox Vaccination status: Does not appear to be vaccinated against Covid  Foley catheter: No  HPI: 55 year old male with past medical history of coronary artery disease, chronic systolic CHF, CVA scented with chest pain and became unresponsive with left-sided weakness and left gaze deviation.  Was intubated in the ER.  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  Extubated on 1/20 and transferred to [facility] service on 1/21.  Subjective: Awakened.  Continues to report both foot ankle and calf pain especially with ambulation.  Markedly improved with adjustments and pain medications on 2/4.  Eager to discharge home.  Requesting wheelchair which will be ordered today.  Objective: Vitals:    01/06/21 2348 01/07/21 0442  BP: 106/78 (!) 127/104  Pulse: 93 85  Resp: 18 18  SpO2: 100% 100%    Intake/Output Summary (Last 24 hours) at 01/07/2021 0757 Last data filed at 01/07/2021 0600 Gross per 24 hour  Intake 360 ml  Output --  Net 360 ml   Filed Weights   01/03/21 0215 01/04/21 0338 01/06/21 0500  Weight: 78.9 kg 80 kg 82.8 kg    Exam:  Constitutional: Awakened from sleep, no acute distress, pleasant Respiratory: Bilateral lung sounds clear to auscultation posteriorly, stable on room air Cardiovascular: Heart sounds S1-S2, no peripheral edema, pulse regular. Abdomen: Abdomen soft nontender nondistended with normoactive bowel sounds; LBM 1/31-tolerating oral diet Skin: Bilateral toe amputation site covered with dressings with no obvious drainage or bleeding Neurologic: CN 2-12 grossly intact. Sensation intact, DTR normal. Strength 5/5 x all 4 extremities.  Psychiatric: Alert and oriented x 3. Normal mood.    Assessment/Plan: Acute problems: Acute ischemic stroke-right superior and middle cerebellar peduncle -Reviewed CT head and MRI brain and MRA head and neck -EEG shows diffuse encephalopathy -Continue aspirin, statin and Plavix for 3 weeks and then aspirin alone as per neurology's recommendation -1/25: Due to persistent right-sided weakness and that he claims worsening-repeat CT head and MRI and EEG done which showed no acute findings. -A1c: 6.4%, LDL: 72, echo shows ejection fraction less than 20% with grade 2 diastolic dysfunction -Neurology signed off -PT/OT recommended SNF but patient prefers to discharge with friends -According to PT note from 2/7  patient remains impulsive with no awareness of safety/deficits.  He is a high fall risk.  Stating he wants to go home and is again refusing SNF.  Recommend rolling walker and wheelchair if he declines SNF.-This has already been requested  Bilateral great toe ulcers/osteomyelitis noted in left/status post multiple toe  amputations: -S/p bilateral great toe amputation on 2/2 via podiatry -2/4 resume DVT prophylaxis with Lovenox -Patient continues to have severe pain unrelieved by OxyIR 10 mg as needed as well as IV Dilaudid for severe pain. -2/4: Begin OxyContin 20 mg every 12 hours, decrease OxyIR to 5 mg every 4 hours as needed for breakthrough pain, continue IV Dilaudid for severe pain, add Lyrica 75 mg TID for neuropathic component and likely issues related to phantom limb discomfort **TOC benefit eligibility has determined that patient has Medicaid of [address] and Lyrica as well as OxyContin has a $4 co-pay for each prescription -Over the weekend based on culture results was started on Cipro for a total of 2 weeks (wound culture positive for Proteus vulgaris resistant to ampicillin and intermediate to Unasyn resistant to cefazolin and intermediate to imipenem.  Sensitive to Cipro.) -We will ask TOC benefits coordinator to determine if Lyrica needs preauthorization and to see if alternative formulation of OxyContin required by Medicaid  Chronic combined systolic and diastolic heart failure with ejection fraction of less than 20% and grade 2 diastolic dysfunction: -Likely cocaine induced -Remains euvolemic -Continue low-dose Coreg, on 2/4 given suboptimal blood pressure reading Imdur changed to shorter acting low-dose isosorbide.  Hydralazine was also discontinued.  Continue Entresto; continue Aldactone for now-see below regarding possible Lasix adjustment -If orthostatic may need to hold Lasix and monitor weights closely prior to discharge -Strict INO's and daily weight -Follow-up with cardiology outpatient-appreciate cardiology's recommendation  ?  Seizures: -EEG from 1/1 17 shows diffuse encephalopathy, nonspecific etiology.  EEG from 1/19 showed evidence of epileptogenic city arising from right more than left frontal region. -EEG on 1/26 shows no seizures or epileptiform discharges were seen  throughout the recording. -Continue Keppra 500 mg twice daily as per neurology's recommendations  Coronary artery disease: -Cardiology recommended outpatient cath at least 1 month after recovery from stroke. -Continue DAPT -Continue aspirin and atorvastatin  Physical deconditioning/gait instability -Last PT evaluation was on 2/3 prior to adjustments in pain medications.  Patient was having significant difficulty with mobility secondary to pain -He was also reporting dizziness over the weekend with standing but would not allow vital signs to be checked.  We will attempt to obtain orthostatic vital signs. -We will order rolling walker and wheelchair noting patient is at high risk for falls although he is making progress needing only minimal assist to come to stand with rolling walker and minimal to mod assist x2 to stand step to the right with a rolling walker    Other problems: Acute metabolic/toxic encephalopathy: -Multifactorial in the setting of stroke, drug use, respiratory failure and sedation -Resolved -Continue folic acid, thiamine and multivitamin -Seen by psychiatry-appreciate help  Hypertension:  Blood pressure 140s/100s-likely secondary to pain with significant improvement in hypertension with improved pain control as above. -The above regarding adjustments of antihypertensive medications -Monitor blood pressure closely  Acute transaminitis -LFTs as of 2/3 are within normal limits except for low albumin of 3.1 -Elevated LFTs likely related to shock liver phases of admission the mild elevation of total bilirubin to 1.7 therefore not consistent with acute liver failure  Cocaine use:  Counseled about cessation  Hyponatremia:  Resolved  Bipolar disorder/homelessness: -Poor compliance.  Homelessness.   Consulted psychiatry-evaluated and cleared him a danger to himself or to others. -Continue Depakote  Hyperlipidemia:  Continue statin.    Data  Reviewed: Basic Metabolic Panel: Recent Labs  Lab 01/02/21 0400 01/03/21 0821 01/04/21 0440 01/06/21 0208 01/06/21 1113 01/07/21 0222  NA 138 137  --  134* 135  --   K 3.9 4.2  --  3.9 3.9  --   CL 103 101  --  100 99  --   CO2 23 26  --  23 24  --   GLUCOSE 103* 113*  --  154* 148*  --   BUN 13 19  --  12 7  --   CREATININE 0.90 1.01  --  1.03 0.89  --   CALCIUM 9.6 9.1  --  8.6* 8.9  --   MG 1.9 1.8 1.8 1.8  --  2.1  PHOS 4.5  --   --   --   --   --    Liver Function Tests: Recent Labs  Lab 01/02/21 0400 01/03/21 0821  AST 28 20  ALT 25 25  ALKPHOS 66 63  BILITOT 0.6 1.0  PROT 7.5 7.7  ALBUMIN 2.9* 3.1*   No results for input(s): LIPASE, AMYLASE in the last 168 hours. No results for input(s): AMMONIA in the last 168 hours. CBC: Recent Labs  Lab 01/02/21 0400 01/03/21 0821 01/04/21 0440 01/06/21 0208  WBC 5.8 10.1 9.2 8.9  NEUTROABS 2.0  --   --   --   HGB 15.4 13.8 13.4 12.5*  HCT 47.3 40.9 40.7 38.6*  MCV 94.8 94.0 93.3 94.8  PLT 338 310 270 231   Cardiac Enzymes: No results for input(s): CKTOTAL, CKMB, CKMBINDEX, TROPONINI in the last 168 hours. BNP (last 3 results) No results for input(s): BNP in the last 8760 hours.  ProBNP (last 3 results) No results for input(s): PROBNP in the last 8760 hours.  CBG: Recent Labs  Lab 01/06/21 0753 01/06/21 1245 01/06/21 1542 01/06/21 2021 01/06/21 2351  GLUCAP 117* 116* 102* 158* 171*    Recent Results (from the past 240 hour(s))  Surgical pcr screen     Status: None   Collection Time: 01/01/21  8:08 PM   Specimen: Nasal Mucosa; Nasal Swab  Result Value Ref Range Status   MRSA, PCR NEGATIVE NEGATIVE Final   Staphylococcus aureus NEGATIVE NEGATIVE Final    Comment: (NOTE) The Xpert SA Assay (FDA approved for NASAL specimens in patients 54 years of age and older), is one component of a comprehensive surveillance program. It is not intended to diagnose infection nor to guide or monitor  treatment. Performed at [facility], [address] [place], [name] [address]   Aerobic/Anaerobic Culture (surgical/deep wound)     Status: None (Preliminary result)   Collection Time: 01/02/21  1:52 PM   Specimen: Wound  Result Value Ref Range Status   Specimen Description WOUND  Final   Special Requests LEFT TOE SPEC A  Final   Gram Stain   Final    NO WBC SEEN FEW GRAM POSITIVE COCCI RARE GRAM NEGATIVE RODS Performed at [facility], [address], [place], [name] [address]    Culture   Final    FEW PROTEUS VULGARIS MIXED ANAEROBIC FLORA PRESENT.  CALL LAB IF FURTHER IID REQUIRED.    Report Status PENDING  Incomplete   Organism ID, Bacteria PROTEUS VULGARIS  Final  Susceptibility   Proteus vulgaris - MIC*    AMPICILLIN >=32 RESISTANT Resistant     CEFAZOLIN >=64 RESISTANT Resistant     CEFEPIME <=0.12 SENSITIVE Sensitive     CEFTAZIDIME <=1 SENSITIVE Sensitive     CIPROFLOXACIN <=0.25 SENSITIVE Sensitive     GENTAMICIN <=1 SENSITIVE Sensitive     IMIPENEM 8 INTERMEDIATE Intermediate     TRIMETH/SULFA <=20 SENSITIVE Sensitive     AMPICILLIN/SULBACTAM 16 INTERMEDIATE Intermediate     PIP/TAZO <=4 SENSITIVE Sensitive     * FEW PROTEUS VULGARIS     Studies: No results found.  Scheduled Meds: . (feeding supplement) PROSource Plus  30 mL Oral BID BM  . aspirin  81 mg Oral Daily  . atorvastatin  40 mg Oral Daily  . carvedilol  6.25 mg Oral BID WC  . ciprofloxacin  500 mg Oral BID  . clopidogrel  75 mg Oral Daily  . collagenase   Topical Daily  . divalproex  500 mg Oral Daily  . enoxaparin (LOVENOX) injection  40 mg Subcutaneous Q24H  . feeding supplement  237 mL Oral BID BM  . folic acid  1 mg Oral Daily  . furosemide  40 mg Oral Daily  . isosorbide dinitrate  5 mg Oral TID  . levETIRAcetam  500 mg Oral BID  . magnesium oxide  400 mg Oral BID  . multivitamin with minerals  1 tablet Oral Daily  . oxyCODONE  20 mg Oral Q12H  . polyethylene  glycol  17 g Per Tube Daily  . potassium chloride  40 mEq Oral Once  . pregabalin  75 mg Oral TID  . sacubitril-valsartan  1 tablet Oral BID  . silver sulfADIAZINE   Topical BID  . spironolactone  12.5 mg Oral Daily  . thiamine  100 mg Oral Daily   Continuous Infusions: . lactated ringers Stopped (01/02/21 1552)    Active Problems:   Ischemic stroke (HCC)   Basilar artery embolus   Acute on chronic combined systolic and diastolic CHF (congestive heart failure) (HCC)   Respiratory failure (HCC)   Ulcer of great toe, left, with necrosis of bone (HCC)   Ulcer of great toe, right, with necrosis of muscle (HCC)   Acute osteomyelitis of toe, left (HCC)   Osteomyelitis of great toe (HCC)   Shock liver   Acute metabolic encephalopathy   CAD (coronary artery disease)   HTN (hypertension)   Bipolar I disorder (HCC)   Cocaine abuse (HCC)   Homelessness   HLD (hyperlipidemia)   Consultants:  Neurology  Cardiology  Podiatry  Palliative  Procedures: Bilateral great toe amputation  Antibiotics: Anti-infectives (From admission, onward)   Start     Dose/Rate Route Frequency Ordered Stop   01/05/21 1145  ciprofloxacin (CIPRO) tablet 500 mg        500 mg Oral 2 times daily 01/05/21 1057 01/19/21 0759   01/02/21 1406  vancomycin (VANCOCIN) powder  Status:  Discontinued          As needed 01/02/21 1406 01/02/21 1428   12/17/20 1450  ceFAZolin (ANCEF) 2-4 GM/100ML-% IVPB       Note to Pharmacy: Cristy Friedlander   : cabinet override      12/17/20 1450 12/17/20 1911       Time spent: 40 minutes    Junious Silk ANP  [facility] 7 am - 330 pm/M-F for direct patient care and secure chat Please refer to Amion for contact info 21  days

## 2021-01-07 NOTE — Progress Notes (Signed)
PT Cancellation Note  Patient Details Name: [name] MRN: [id number] DOB: May 19, 1966   Cancelled Treatment:    Reason Eval/Treat Not Completed: Pain limiting ability to participate Pt writhing and moaning in pain all over the bed; just given pain meds. Will follow up as time allows.   Blake Divine A Rayen Palen 01/07/2021, 9:09 AM Vale Haven, PT, DPT Acute Rehabilitation Services Pager [phone] Office [phone]

## 2021-01-07 NOTE — Care Management (Cosign Needed)
    Durable Medical Equipment  (From admission, onward)         Start     Ordered   01/07/21 1039  For home use only DME Walker rolling  Once       Comments: S/P Amputation of bilateral great toes  Question Answer Comment  Walker: With 5 Inch Wheels   Patient needs a walker to treat with the following condition Amputation of great toe (HCC)      01/07/21 1039   01/07/21 1038  For home use only DME lightweight manual wheelchair with seat cushion  Once       Comments: Patient suffers from S/P bilateral great toe amputation which impairs their ability to perform daily activities like ADLs:20651 in the home.  A walking aid:20652 will not resolve  issue with performing activities of daily living. A wheelchair will allow patient to safely perform daily activities. Patient is not able to propel themselves in the home using a standard weight wheelchair due to weakness:20653. Patient can self propel in the lightweight wheelchair. Length of need 6 months. Accessories: elevating leg rests (ELRs), wheel locks, extensions and anti-tippers.   01/07/21 1039         

## 2021-01-07 NOTE — Progress Notes (Signed)
Physical Therapy Treatment Patient Details Name: [name] MRN: [id number] DOB: 1966/09/22 Today's Date: 01/07/2021    History of Present Illness Homeless 55 yo male found down with c/o CP by bystanders. 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 Extubated 1/20. S/p bil great toe amputations 01/02/21.    PT Comments    Patient progressing slowly towards PT goals. Tolerated gait training today with Mod-Max A for balance, RW management, weight shifting, and overall safety with use of RW. Pt able to self correct right lateral lean minimally with cues but not sustain. Pt's mentation/cognition waxing and waning between cooperative and irritated during session. Continues to be impulsive with no awareness of safety/deficits. High fall risk. Pt reports he wants to go home as he is refusing SNF. Recommend RW and w/c if he declines SNF. Will follow.   Follow Up Recommendations  SNF;Supervision/Assistance - 24 hour (pt refusing)     Equipment Recommendations  Wheelchair (measurements PT);Wheelchair cushion (measurements PT);Rolling walker with 5" wheels    Recommendations for Other Services       Precautions / Restrictions Precautions Precautions: Fall Precaution Comments: orthostatic hypotension; pt easily agitated due to psych h/o, is very unsafe; s/p bil great toe amputation 01/02/21 Restrictions Weight Bearing Restrictions: Yes RLE Weight Bearing: Weight bearing as tolerated LLE Weight Bearing: Weight bearing as tolerated Other Position/Activity Restrictions: Post-op shoes present in room    Mobility  Bed Mobility Overal bed mobility: Needs Assistance Bed Mobility: Supine to Sit     Supine to sit: Min guard;HOB elevated Sit to supine: Supervision;HOB elevated   General bed mobility comments:  Impulsively getting to EOB, "wait a minute, wait a minute," when tech trying to donn gown on back.  Transfers Overall transfer level: Needs assistance Equipment used: Rolling walker (2 wheeled) Transfers: Sit to/from Stand Sit to Stand: Mod assist         General transfer comment: Assist to power to standing with manual assist to place right hand on RW due to ataxic like movement. Cues for proper foot placement prior to standing and for technique. Stood from [employer], from chair x1. Unsteady once upright.  Ambulation/Gait Ambulation/Gait assistance: Mod assist;Max assist;+2 safety/equipment Gait Distance (Feet): 50 Feet (x2 bouts) Assistive device: Rolling walker (2 wheeled) Gait Pattern/deviations: Decreased weight shift to left;Decreased step length - left;Ataxic;Trunk flexed;Step-through pattern;Narrow base of support Gait velocity: reduced   General Gait Details: Unsteady gait with narrow BoS, heavy right lateral lean, ataxic like pattern with uncoordinated placement of RLE; assist with RW management, upright posture, weight shifting and sequencing steps. Constant assist to maintain balance. Fatigues. 1 seated rest break.   Stairs             Wheelchair Mobility    Modified Rankin (Stroke Patients Only) Modified Rankin (Stroke Patients Only) Pre-Morbid Rankin Score: No symptoms Modified Rankin: Moderately severe disability     Balance Overall balance assessment: Needs assistance Sitting-balance support: Feet supported;Single extremity supported Sitting balance-Leahy Scale: Poor Sitting balance - Comments: Sitting EOB with UE support and min guard for safety with trunk lean towards right.   Standing balance support: During functional activity;Bilateral upper extremity supported Standing balance-Leahy Scale: Poor Standing balance comment: reliant on BUE and external support for safety. Right lateral lean  Cognition  Arousal/Alertness: Awake/alert Behavior During Therapy: Impulsive Overall Cognitive Status: Impaired/Different from baseline Area of Impairment: Attention;Memory;Following commands;Safety/judgement;Awareness;Problem solving                   Current Attention Level: Selective Memory: Decreased short-term memory;Decreased recall of precautions Following Commands: Follows one step commands with increased time;Follows one step commands inconsistently Safety/Judgement: Decreased awareness of safety;Decreased awareness of deficits Awareness: Emergent Problem Solving: Slow processing;Decreased initiation;Requires verbal cues;Difficulty sequencing;Requires tactile cues General Comments: Pt impulsive and having difficulty maintaining attention to cues, needing them to be repeated often. Cues to sequence tasks and for safety, "I can walk by myself now, just need a w/c, after needing Mod-Max A from therapist.  Wavering from pleasant to irritated during session.      Exercises      General Comments General comments (skin integrity, edema, etc.): No reports of dizziness.      Pertinent Vitals/Pain Pain Assessment: Faces Faces Pain Scale: Hurts a little bit Pain Location: surgical site on bil feet Pain Descriptors / Indicators: Sore;Operative site guarding Pain Intervention(s): Premedicated before session;Repositioned;Monitored during session    Home Living                      Prior Function            PT Goals (current goals can now be found in the care plan section) Progress towards PT goals: Progressing toward goals (slowly)    Frequency    Min 3X/week      PT Plan Current plan remains appropriate    Co-evaluation              AM-PAC PT "6 Clicks" Mobility   Outcome Measure  Help needed turning from your back to your side while in a flat bed without using bedrails?: None Help needed moving from lying on your back to sitting on the side of a flat bed  without using bedrails?: None Help needed moving to and from a bed to a chair (including a wheelchair)?: A Lot Help needed standing up from a chair using your arms (e.g., wheelchair or bedside chair)?: A Lot Help needed to walk in hospital room?: A Lot Help needed climbing 3-5 steps with a railing? : Total 6 Click Score: 15    End of Session Equipment Utilized During Treatment: Gait belt Activity Tolerance: Patient tolerated treatment well;Patient limited by fatigue Patient left: in bed;with call bell/phone within reach;with bed alarm set Nurse Communication: Mobility status PT Visit Diagnosis: Unsteadiness on feet (R26.81);Other symptoms and signs involving the nervous system (R29.898);Other abnormalities of gait and mobility (R26.89);Ataxic gait (R26.0);Difficulty in walking, not elsewhere classified (R26.2)     Time: 1601-0932 PT Time Calculation (min) (ACUTE ONLY): 19 min  Charges:  $Gait Training: 8-22 mins                     Vale Haven, PT, DPT Acute Rehabilitation Services Pager [phone] Office [phone]       Blake Divine A Lanier Ensign 01/07/2021, 12:18 PM

## 2021-01-08 ENCOUNTER — Inpatient Hospital Stay (HOSPITAL_COMMUNITY): Payer: Medicaid Other

## 2021-01-08 DIAGNOSIS — L97524 Non-pressure chronic ulcer of other part of left foot with necrosis of bone: Secondary | ICD-10-CM | POA: Diagnosis not present

## 2021-01-08 DIAGNOSIS — R471 Dysarthria and anarthria: Secondary | ICD-10-CM

## 2021-01-08 DIAGNOSIS — G249 Dystonia, unspecified: Secondary | ICD-10-CM

## 2021-01-08 DIAGNOSIS — L97513 Non-pressure chronic ulcer of other part of right foot with necrosis of muscle: Secondary | ICD-10-CM | POA: Diagnosis not present

## 2021-01-08 DIAGNOSIS — I639 Cerebral infarction, unspecified: Secondary | ICD-10-CM | POA: Diagnosis not present

## 2021-01-08 LAB — CBC WITH DIFFERENTIAL/PLATELET
Abs Immature Granulocytes: 0.01 10*3/uL (ref 0.00–0.07)
Basophils Absolute: 0 10*3/uL (ref 0.0–0.1)
Basophils Relative: 1 %
Eosinophils Absolute: 0.2 10*3/uL (ref 0.0–0.5)
Eosinophils Relative: 3 %
HCT: 41.3 % (ref 39.0–52.0)
Hemoglobin: 13.9 g/dL (ref 13.0–17.0)
Immature Granulocytes: 0 %
Lymphocytes Relative: 33 %
Lymphs Abs: 2.4 10*3/uL (ref 0.7–4.0)
MCH: 31 pg (ref 26.0–34.0)
MCHC: 33.7 g/dL (ref 30.0–36.0)
MCV: 92 fL (ref 80.0–100.0)
Monocytes Absolute: 1.2 10*3/uL — ABNORMAL HIGH (ref 0.1–1.0)
Monocytes Relative: 16 %
Neutro Abs: 3.4 10*3/uL (ref 1.7–7.7)
Neutrophils Relative %: 47 %
Platelets: 288 10*3/uL (ref 150–400)
RBC: 4.49 MIL/uL (ref 4.22–5.81)
RDW: 13.4 % (ref 11.5–15.5)
WBC: 7.2 10*3/uL (ref 4.0–10.5)
nRBC: 0 % (ref 0.0–0.2)

## 2021-01-08 IMAGING — MR MR HEAD W/O CM
8 of 10 series · 40 of 48 positions shown · non-contrast
Comparison: MRI head [DATE].

CLINICAL DATA: Mental status change.

EXAM:
MRI HEAD WITHOUT CONTRAST
TECHNIQUE: Multiplanar, multiecho pulse sequences of the brain and surrounding
structures were obtained without intravenous contrast.

[Series 3: DWI · axial · 3.0mm · 1.09mm/px · z∈[-41,+100]mm · 11 of 96 slices shown (1 of 4)]
[im 1/96]
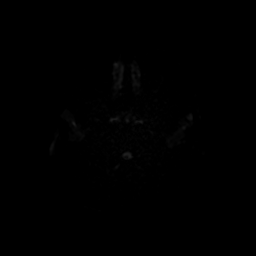
[im 10/96]
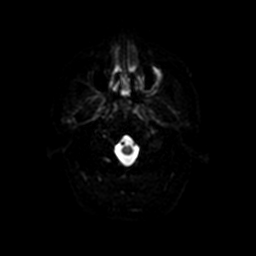
[im 20/96]
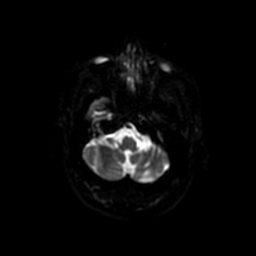
[im 29/96]
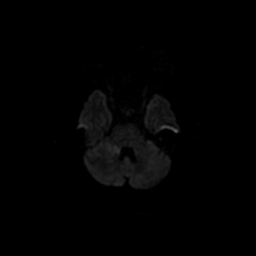
[im 39/96]
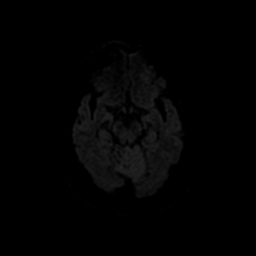
[im 48/96]
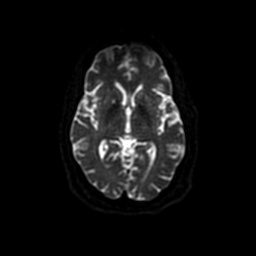
[im 58/96]
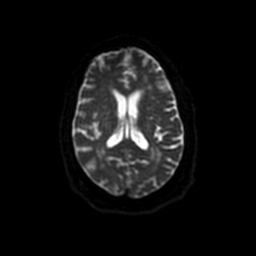
[im 67/96]
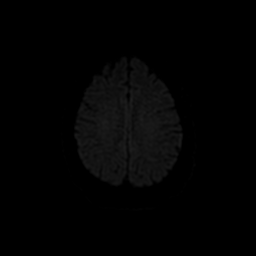
[im 77/96]
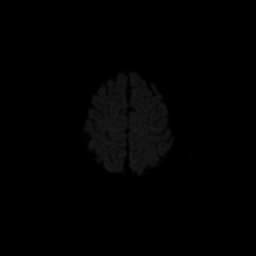
[im 86/96]
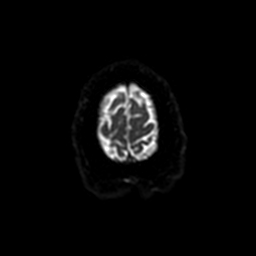
[im 96/96]
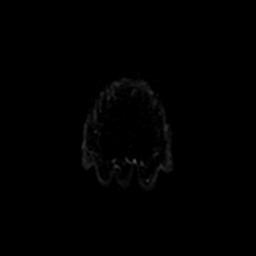

[Series 4: DWI · coronal · 5.0mm · 1.09mm/px · 8 of 76 slices shown (2 of 4)]
[im 1/76]
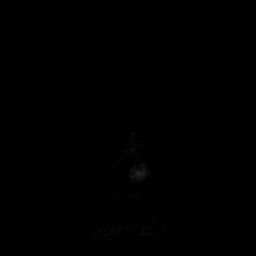
[im 11/76]
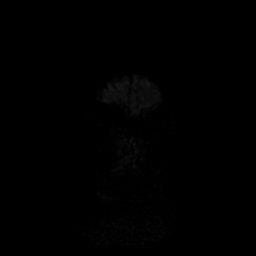
[im 22/76]
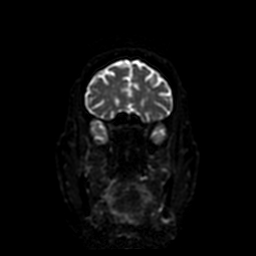
[im 33/76]
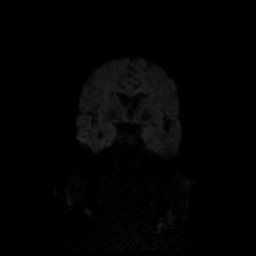
[im 43/76]
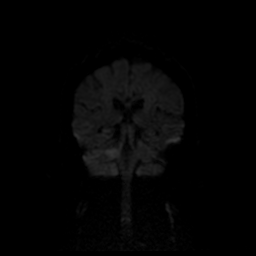
[im 54/76]
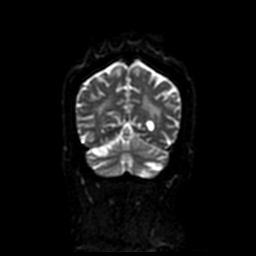
[im 65/76]
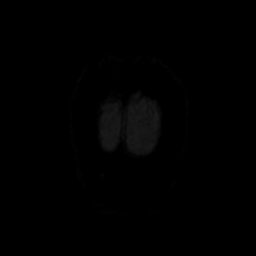
[im 76/76]
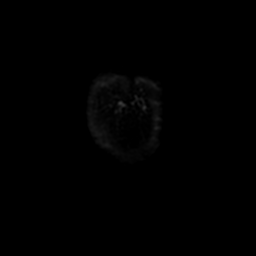

[Series 5: T1 · sagittal · 5.0mm · 0.47mm/px · 1 of 23 slices shown]
[im 1/23]
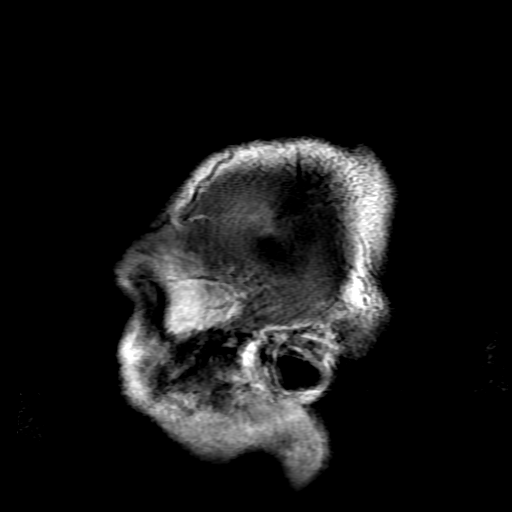

[Series 7: FLAIR · axial · 5.0mm · 0.47mm/px · z∈[-50,+87]mm · 3 of 24 slices shown]
[im 1/24]
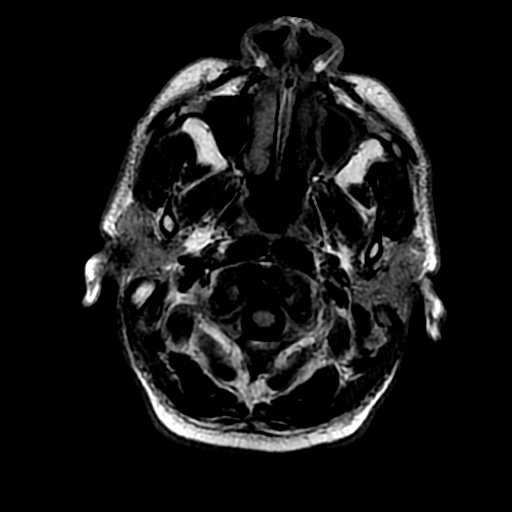
[im 12/24]
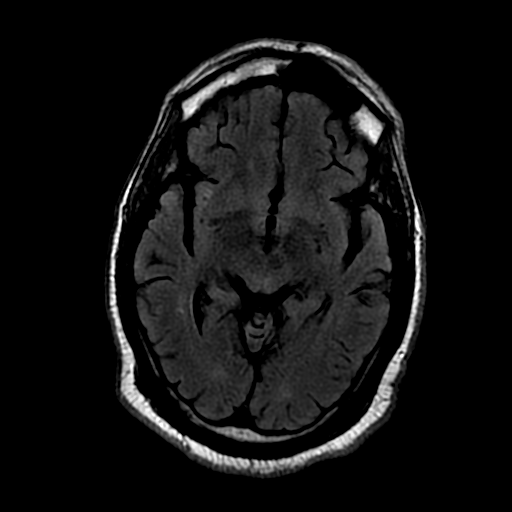
[im 24/24]
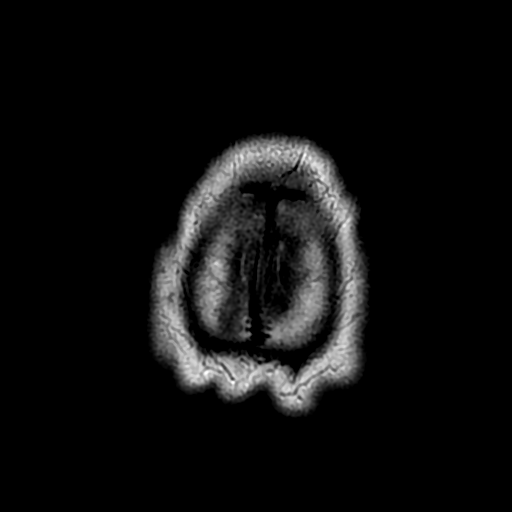

[Series 8: T2 · axial · 5.0mm · 0.47mm/px · z∈[-50,+87]mm · 3 of 24 slices shown (1 of 2)]
[im 1/24]
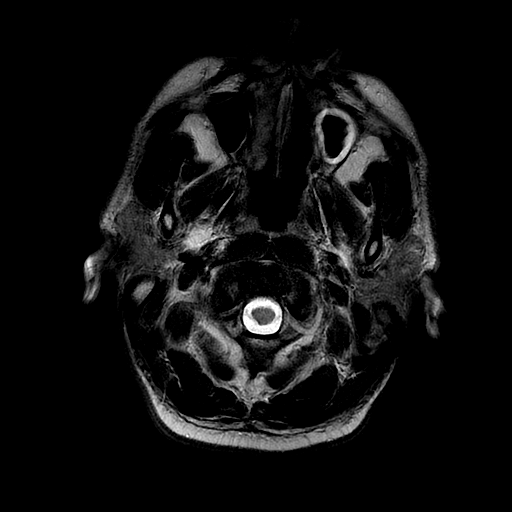
[im 12/24]
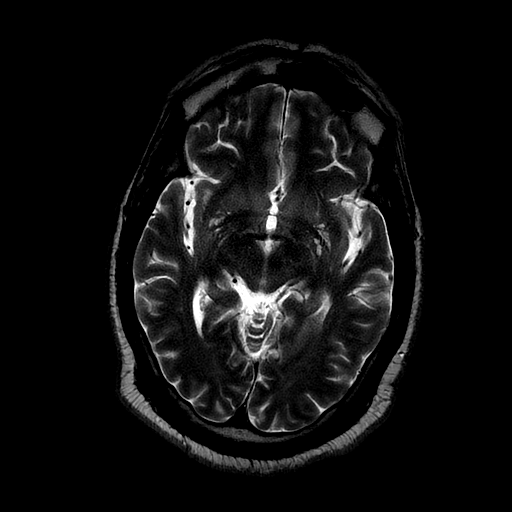
[im 24/24]
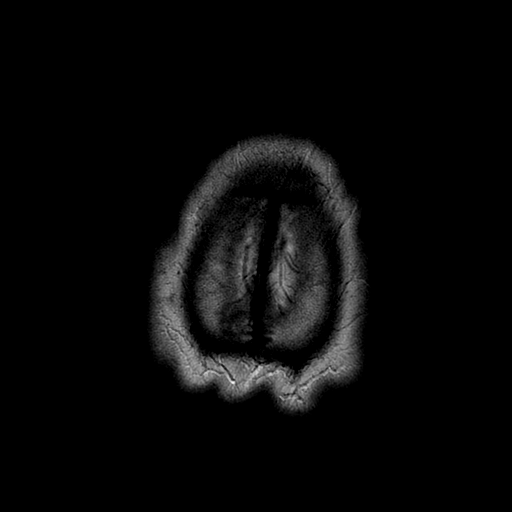

[Series 10: T2 · coronal · 5.0mm · 0.43mm/px · 3 of 24 slices shown (2 of 2)]
[im 1/24]
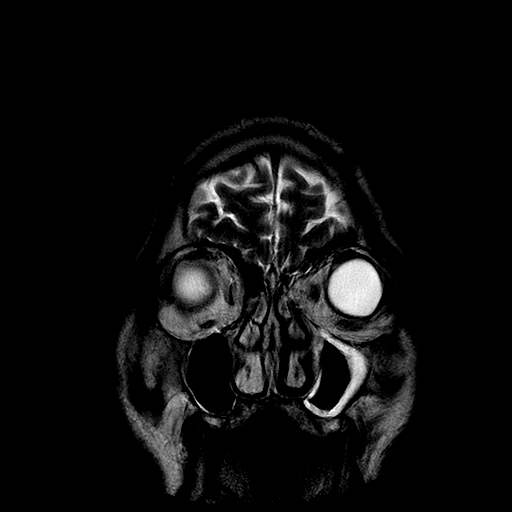
[im 12/24]
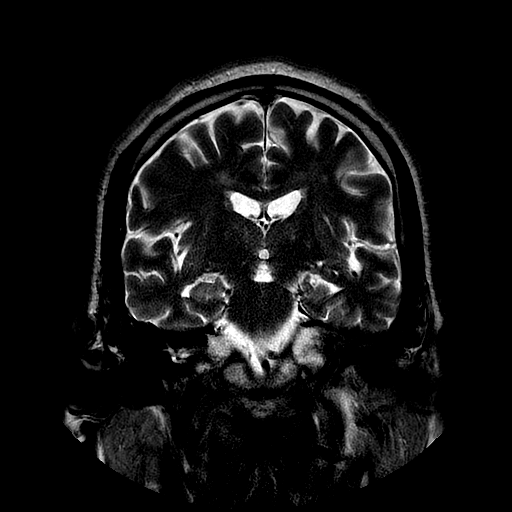
[im 24/24]
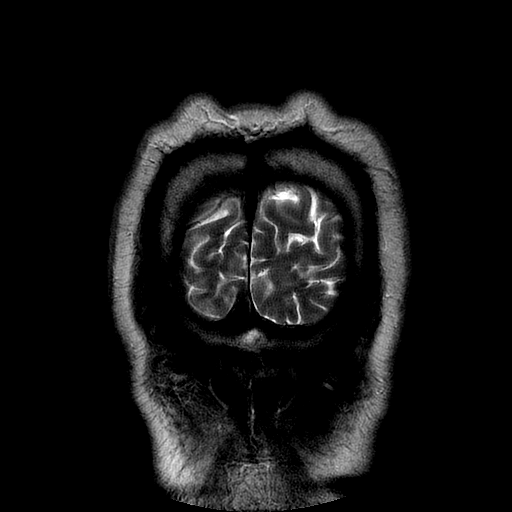

[Series 300: DWI · axial · 3.0mm · 1.09mm/px · z∈[-41,+100]mm · 6 of 48 slices shown (3 of 4)]
[im 1/48]
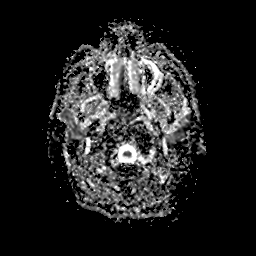
[im 10/48]
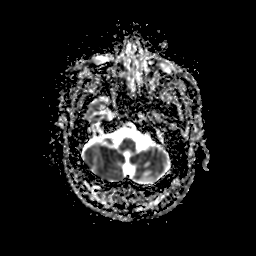
[im 19/48]
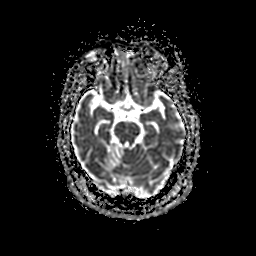
[im 29/48]
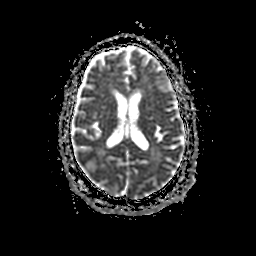
[im 38/48]
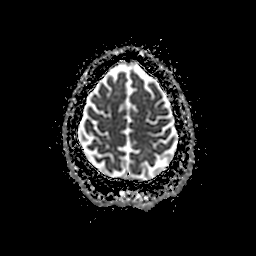
[im 48/48]
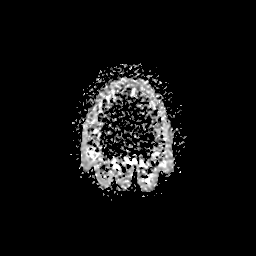

[Series 400: DWI · coronal · 5.0mm · 1.09mm/px · 5 of 38 slices shown (4 of 4)]
[im 1/38]
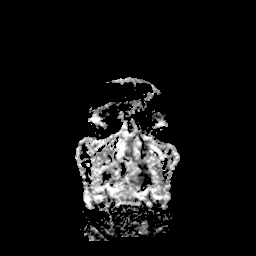
[im 10/38]
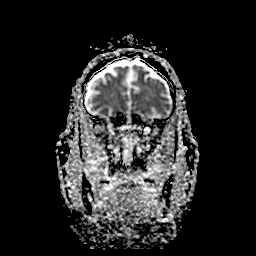
[im 19/38]
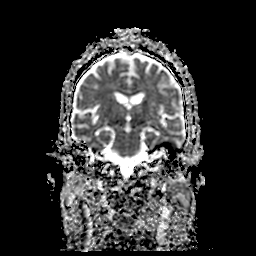
[im 28/38]
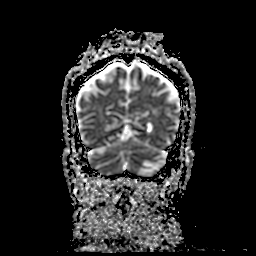
[im 38/38]
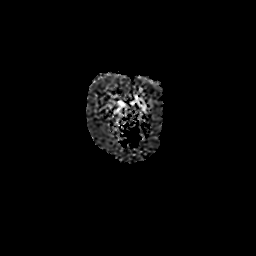

[40 of 48 positions shown; findings below may reference images not displayed]

FINDINGS: Brain: Evolving encephalomalacia associated with a subacute right
superior cerebellar infarct with some mild persistent restricted
diffusion in this region. The extent of restricted diffusion appears
similar to prior MRI. No definite new areas of restricted diffusion.
No substantial mass effect. The fourth ventricle is widely patent.
No hydrocephalus. No acute hemorrhage. No mass lesion. No midline
shift. Basal cisterns are patent. Remote left cerebellar infarct
with encephalomalacia. Mild to moderate patchy white matter T2/FLAIR
hyperintensities, likely the sequela of chronic microvascular
ischemic disease.

Vascular: Attenuated left distal vertebral artery flow void. See
prior MRA for vascular evaluation.

Skull and upper cervical spine: Normal marrow signal.

Sinuses/Orbits: Left maxillary sinus mucosal thickening. Remote left
medial orbital wall fracture.

Other: No mastoid effusions.
IMPRESSION: 1. Evolving right cerebellar infarct without evidence of interval
new acute infarct. No substantial mass effect. The fourth ventricle
is patent and there is no evidence of hydrocephalus.
2. Remote left cerebellar infarct.
3. Attenuated left distal vertebral artery flow void. See prior MRA
for vascular evaluation.
4. Mild to moderate chronic microvascular ischemic disease.

## 2021-01-08 MED ORDER — PREGABALIN 25 MG PO CAPS: 25.0000 mg | ORAL_CAPSULE | Freq: Three times a day (TID) | ORAL | Status: DC

## 2021-01-08 NOTE — Progress Notes (Signed)
OT Cancellation Note  Patient Details Name: [name] MRN: [id number] DOB: 06-29-1966   Cancelled Treatment:    Reason Eval/Treat Not Completed: Patient off floor for stat MRI. OT to check back as time allows.   Kallie Edward OTR/L Supplemental OT, Department of rehab services [phone]  Alessandria Henken R H.  01/08/2021, 9:53 AM

## 2021-01-08 NOTE — Progress Notes (Signed)
Per request by Dr. Loney Loh, pt asked about his willingness to take BP and other important meds. Pt refused.

## 2021-01-08 NOTE — Progress Notes (Signed)
Pt c/o throbbing at roof of mouth. Area appears to be swollen and my concern for possible abscess addressed to Dr. Loney Loh.

## 2021-01-08 NOTE — Plan of Care (Signed)
  Problem: Education: Goal: Knowledge of General Education information will improve Description: Including pain rating scale, medication(s)/side effects and non-pharmacologic comfort measures Outcome: Progressing   Problem: Health Behavior/Discharge Planning: Goal: Ability to manage health-related needs will improve Outcome: Progressing   Problem: Clinical Measurements: Goal: Ability to maintain clinical measurements within normal limits will improve Outcome: Progressing Goal: Will remain free from infection Outcome: Progressing Goal: Diagnostic test results will improve Outcome: Progressing Goal: Respiratory complications will improve Outcome: Progressing Goal: Cardiovascular complication will be avoided Outcome: Progressing   Problem: Activity: Goal: Risk for activity intolerance will decrease Outcome: Progressing   Problem: Nutrition: Goal: Adequate nutrition will be maintained Outcome: Progressing   Problem: Coping: Goal: Level of anxiety will decrease Outcome: Progressing   Problem: Elimination: Goal: Will not experience complications related to bowel motility Outcome: Progressing Goal: Will not experience complications related to urinary retention Outcome: Progressing   Problem: Pain Managment: Goal: General experience of comfort will improve Outcome: Progressing   Problem: Safety: Goal: Ability to remain free from injury will improve Outcome: Progressing   Problem: Skin Integrity: Goal: Risk for impaired skin integrity will decrease Outcome: Progressing   Problem: Activity: Goal: Ability to tolerate increased activity will improve Outcome: Progressing   Problem: Respiratory: Goal: Ability to maintain a clear airway and adequate ventilation will improve Outcome: Progressing   Problem: Role Relationship: Goal: Method of communication will improve Outcome: Progressing   Problem: Education: Goal: Knowledge of disease or condition will  improve Outcome: Progressing Goal: Knowledge of secondary prevention will improve Outcome: Progressing Goal: Knowledge of patient specific risk factors addressed and post discharge goals established will improve Outcome: Progressing Goal: Individualized Educational Video(s) Outcome: Progressing   Problem: Coping: Goal: Will verbalize positive feelings about self Outcome: Progressing Goal: Will identify appropriate support needs Outcome: Progressing   Problem: Health Behavior/Discharge Planning: Goal: Ability to manage health-related needs will improve Outcome: Progressing   Problem: Self-Care: Goal: Ability to participate in self-care as condition permits will improve Outcome: Progressing Goal: Verbalization of feelings and concerns over difficulty with self-care will improve Outcome: Progressing Goal: Ability to communicate needs accurately will improve Outcome: Progressing   Problem: Nutrition: Goal: Risk of aspiration will decrease Outcome: Progressing Goal: Dietary intake will improve Outcome: Progressing   Problem: Education: Goal: Individualized Educational Video(s) Outcome: Progressing   Problem: Ischemic Stroke/TIA Tissue Perfusion: Goal: Complications of ischemic stroke/TIA will be minimized Outcome: Progressing   Problem: Safety: Goal: Non-violent Restraint(s) Outcome: Progressing   Problem: Education: Goal: Knowledge of secondary prevention will improve Outcome: Progressing   

## 2021-01-08 NOTE — Progress Notes (Signed)
Patient agitated this morning, alert and oriented to self and place, refused to answer additional questions. Refused assessment.

## 2021-01-08 NOTE — Progress Notes (Signed)
Pt off the unit via stretcher to MRI procedure. Ativan PO given prior to for anxiety.

## 2021-01-08 NOTE — Plan of Care (Signed)
  Problem: Education: Goal: Knowledge of disease or condition will improve Outcome: Progressing Goal: Knowledge of secondary prevention will improve Outcome: Progressing Goal: Knowledge of patient specific risk factors addressed and post discharge goals established will improve Outcome: Progressing Goal: Individualized Educational Video(s) Outcome: Progressing   Problem: Coping: Goal: Will verbalize positive feelings about self Outcome: Progressing   Problem: Self-Care: Goal: Ability to participate in self-care as condition permits will improve Outcome: Progressing Goal: Verbalization of feelings and concerns over difficulty with self-care will improve Outcome: Progressing Goal: Ability to communicate needs accurately will improve Outcome: Progressing   Problem: Nutrition: Goal: Risk of aspiration will decrease Outcome: Progressing Goal: Dietary intake will improve Outcome: Progressing   Problem: Education: Goal: Individualized Educational Video(s) Outcome: Progressing   Problem: Education: Goal: Knowledge of secondary prevention will improve Outcome: Progressing

## 2021-01-08 NOTE — Progress Notes (Signed)
[facility] PROGRESS NOTE  [name] ENI:[id number] DOB: 07/29/1966 DOA: 12/17/2020 PCP: [name]  Status: Remains inpatient appropriate because:Ongoing active pain requiring inpatient pain management, Unsafe d/c plan and IV treatments appropriate due to intensity of illness or inability to take PO   Dispo:  The patient is from: homeless              Anticipated d/c is to: homeless shelter or streets-patient plans on taking a bus from [place] to [place] and living with "friends" after arrival              Anticipated d/c date is: > 3 days              Patient currently is not medically stable to d/c.  Barriers to discharge: Patient is homeless and currently has significant gait and ambulation issues secondary to recent bilateral amputation of toes.  This is further complicated by inadequate pain control.  And although patient has Medicaid he has a significant substance abuse history and therefore is unlikely to be accepted by nursing homes in the area.   Difficult to place patient Yes   Level of care: Med-Surg  Code Status: Full Family Communication: Patient only DVT prophylaxis: Lovenox Vaccination status: Does not appear to be vaccinated against Covid  Foley catheter: No  HPI: 55 year old male with past medical history of coronary artery disease, chronic systolic CHF, CVA scented with chest pain and became unresponsive with left-sided weakness and left gaze deviation.  Was intubated in the ER.  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  Extubated on 1/20 and transferred to [facility] service on 1/21.  Subjective: Awake.  Having significant difficulty communicating secondary to combination of dysarthria and dystonia.  Also having some tics of the mouth.  Apparently he is having continued pain in his legs and is also having significant pain in the upper palate of  his mouth with recent orthopantogram this morning unremarkable.  It is noted he is able to control his oral secretions but is having difficulty with finding the appropriate words to use and is becoming frustrated.  Continues to state he wants to go home today.  Objective: Vitals:   01/07/21 0852 01/07/21 2038  BP: 122/83 (!) 154/102  Pulse: 86 74  Resp:  18  SpO2:  100%    Intake/Output Summary (Last 24 hours) at 01/08/2021 0749 Last data filed at 01/08/2021 0208 Gross per 24 hour  Intake 360 ml  Output 800 ml  Net -440 ml   Filed Weights   01/04/21 0338 01/06/21 0500 01/08/21 0300  Weight: 80 kg 82.8 kg 85.3 kg    Exam:  Constitutional: Alert, anxious and frustrated given difficulty in verbal communication ENT: Upper palate edematous and pink, poor dentition and missing teeth, no odors or drainage detected, able to manage oral secretions no drooling Respiratory: Bilateral lung sounds clear to auscultation, stable on room air Cardiovascular: Normal heart sounds, pulses regular and nontachycardic, extremities warm to touch with appropriate capillary refill Abdomen:  LBM 1/31 Skin: Bilateral toe amputation site covered with dressings with no obvious drainage or bleeding Neurologic: CN 2-12 grossly intact. Sensation intact, DTR normal. Strength 5/5 x all 4 extremities.  Psychiatric: Awake and frustrated given dysarthria and word finding difficulties.  Patient appears to be alert and oriented times name and place.   Assessment/Plan: Acute problems: Acute ischemic stroke-right superior and middle cerebellar peduncle -Reviewed CT head  and MRI brain and MRA head and neck -EEG shows diffuse encephalopathy -Continue aspirin, statin and Plavix for 3 weeks and then aspirin alone as per neurology's recommendation -1/25: Due to persistent right-sided weakness and that he claims worsening-repeat CT head and MRI and EEG done which showed no acute findings. -A1c: 6.4%, LDL: 72, echo shows  ejection fraction less than 20% with grade 2 diastolic dysfunction -Neurology signed off -PT/OT recommended SNF but patient prefers to discharge with friends -According to PT note from 2/7 patient remains impulsive with no awareness of safety/deficits.  He is a high fall risk.  Stating he wants to go home and is again refusing SNF.  Recommend rolling walker and wheelchair if he declines SNF.This has already been requested -Repeat MRI today on 2/8 as below-no acute infarct  Acute dysarthria with dystonia -Likely secondary to medications i.e. Lyrica -We will discontinue Lyrica as well as Benadryl which can cause inappropriate movements/dystonia -With history of recent stroke obtain stat MRI which did not reveal any acute infarct but did reveal [place] of previous right cerebellar infarct no associated mass-effect and no hydrocephalus.  Oral pain w/ upper palate edema -Orthopantogram unremarkable -No fever or leukocytosis -This with attending and we both agree this is not consistent with angioedema  Bilateral great toe ulcers/osteomyelitis noted in left/status post multiple toe amputations: -S/p bilateral great toe amputation on 2/2 via podiatry -2/4 resume DVT prophylaxis with Lovenox -Patient continues to have severe pain unrelieved by OxyIR 10 mg as needed as well as IV Dilaudid for severe pain. -2/4: Begin OxyContin 20 mg every 12 hours, decrease OxyIR to 5 mg every 4 hours as needed for breakthrough pain, continue IV Dilaudid for severe pain, add Lyrica 75 mg TID for neuropathic component and likely issues related to phantom limb discomfort **TOC benefit eligibility has determined that patient has Medicaid of [address]- Lyrica as well as OxyContin has a $4 co-pay for each prescription -Over the weekend based on culture results was started on Cipro for a total of 2 weeks (wound culture positive for Proteus vulgaris resistant to ampicillin and intermediate to Unasyn resistant to cefazolin  and intermediate to imipenem.  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  Hydralazine was also discontinued.  Continue Entresto; continue Aldactone for now-see below regarding possible Lasix adjustment -If orthostatic may need to hold Lasix and monitor weights closely prior to discharge -Strict I/O's and daily weight -Follow-up with cardiology outpatient-appreciate cardiology's recommendation  Suspected Seizures: -EEG from 1/17 w/ diffuse encephalopathy, nonspecific etiology.  EEG from 1/19 showed evidence of epileptogenic city arising from right more than left frontal region. -EEG on 1/26 shows no seizures or epileptiform discharges were seen throughout the recording. -Continue Keppra 500 mg twice daily as per neurology's recommendations  Coronary artery disease: -Cardiology recommended outpatient cath at least 1 month after recovery from stroke. -Continue DAPT -Continue aspirin and atorvastatin  Physical deconditioning/gait instability -Last PT evaluation was on 2/3 prior to adjustments in pain medications.  Patient was having significant difficulty with mobility secondary to pain -He was also reporting dizziness over the weekend with standing but would not allow vital signs to be checked.  We will attempt to obtain orthostatic vital signs. -  We will order rolling walker and wheelchair noting patient is at high risk for falls although he is making progress needing only minimal assist to come to stand with rolling walker and minimal to mod assist x2 to stand step to the right with a rolling walker    Other problems: Acute  metabolic/toxic encephalopathy: -Multifactorial in the setting of stroke, drug use, respiratory failure and sedation -Resolved -Continue folic acid, thiamine and multivitamin -Seen by psychiatry-appreciate help  Hypertension:  Blood pressure 140s/100s-likely secondary to pain with significant improvement in hypertension with improved pain control as above. -The above regarding adjustments of antihypertensive medications -Monitor blood pressure closely  Acute transaminitis -LFTs as of 2/3 are within normal limits except for low albumin of 3.1 -Elevated LFTs likely related to shock liver phases of admission the mild elevation of total bilirubin to 1.7 therefore not consistent with acute liver failure  Cocaine use:  Counseled about cessation  Hyponatremia:  Resolved  Bipolar disorder/homelessness: -Poor compliance.  Homelessness.   Consulted psychiatry-evaluated and cleared him a danger to himself or to others. -Continue Depakote  Hyperlipidemia:  Continue statin.    Data Reviewed: Basic Metabolic Panel: Recent Labs  Lab 01/02/21 0400 01/03/21 0821 01/04/21 0440 01/06/21 0208 01/06/21 1113 01/07/21 0222  NA 138 137  --  134* 135  --   K 3.9 4.2  --  3.9 3.9  --   CL 103 101  --  100 99  --   CO2 23 26  --  23 24  --   GLUCOSE 103* 113*  --  154* 148*  --   BUN 13 19  --  12 7  --   CREATININE 0.90 1.01  --  1.03 0.89  --   CALCIUM 9.6 9.1  --  8.6* 8.9  --   MG 1.9 1.8 1.8 1.8  --  2.1  PHOS 4.5  --   --   --   --   --    Liver Function Tests: Recent Labs  Lab 01/02/21 0400 01/03/21 0821  AST 28 20  ALT 25 25  ALKPHOS 66 63  BILITOT 0.6 1.0  PROT 7.5 7.7  ALBUMIN 2.9* 3.1*   No results for input(s): LIPASE, AMYLASE in the last 168 hours. No results for input(s): AMMONIA in the last 168 hours. CBC: Recent Labs  Lab 01/02/21 0400 01/03/21 0821 01/04/21 0440 01/06/21 0208  WBC 5.8 10.1 9.2 8.9  NEUTROABS 2.0  --   --   --   HGB 15.4 13.8  13.4 12.5*  HCT 47.3 40.9 40.7 38.6*  MCV 94.8 94.0 93.3 94.8  PLT 338 310 270 231   Cardiac Enzymes: No results for input(s): CKTOTAL, CKMB, CKMBINDEX, TROPONINI in the last 168 hours. BNP (last 3 results) No results for input(s): BNP in the last 8760 hours.  ProBNP (last 3 results) No results for input(s): PROBNP in the last 8760 hours.  CBG: Recent Labs  Lab 01/06/21 1245 01/06/21 1542 01/06/21 2021 01/06/21 2351 01/07/21 0800  GLUCAP 116* 102* 158* 171* 158*    Recent Results (from the past 240 hour(s))  Surgical pcr screen     Status: None   Collection Time: 01/01/21  8:08 PM   Specimen: Nasal Mucosa; Nasal Swab  Result Value Ref Range Status   MRSA, PCR NEGATIVE NEGATIVE Final   Staphylococcus aureus NEGATIVE NEGATIVE Final    Comment: (NOTE) The Xpert SA Assay (FDA approved for NASAL specimens in patients 86 years of age and  older), is one component of a comprehensive surveillance program. It is not intended to diagnose infection nor to guide or monitor treatment. Performed at [facility], [address] [place], [name] [address]   Aerobic/Anaerobic Culture (surgical/deep wound)     Status: None   Collection Time: 01/02/21  1:52 PM   Specimen: Wound  Result Value Ref Range Status   Specimen Description WOUND  Final   Special Requests LEFT TOE SPEC A  Final   Gram Stain   Final    NO WBC SEEN FEW GRAM POSITIVE COCCI RARE GRAM NEGATIVE RODS Performed at [facility], [address], [place], [name] [address]    Culture   Final    FEW PROTEUS VULGARIS MIXED ANAEROBIC FLORA PRESENT.  CALL LAB IF FURTHER IID REQUIRED.    Report Status 01/07/2021 FINAL  Final   Organism ID, Bacteria PROTEUS VULGARIS  Final      Susceptibility   Proteus vulgaris - MIC*    AMPICILLIN >=32 RESISTANT Resistant     CEFAZOLIN >=64 RESISTANT Resistant     CEFEPIME <=0.12 SENSITIVE Sensitive     CEFTAZIDIME <=1 SENSITIVE Sensitive     CIPROFLOXACIN <=0.25  SENSITIVE Sensitive     GENTAMICIN <=1 SENSITIVE Sensitive     IMIPENEM 8 INTERMEDIATE Intermediate     TRIMETH/SULFA <=20 SENSITIVE Sensitive     AMPICILLIN/SULBACTAM 16 INTERMEDIATE Intermediate     PIP/TAZO <=4 SENSITIVE Sensitive     * FEW PROTEUS VULGARIS     Studies: No results found.  Scheduled Meds: . (feeding supplement) PROSource Plus  30 mL Oral BID BM  . aspirin  81 mg Oral Daily  . atorvastatin  40 mg Oral Daily  . carvedilol  6.25 mg Oral BID WC  . ciprofloxacin  500 mg Oral BID  . clopidogrel  75 mg Oral Daily  . collagenase   Topical Daily  . divalproex  500 mg Oral Daily  . enoxaparin (LOVENOX) injection  40 mg Subcutaneous Q24H  . feeding supplement  237 mL Oral BID BM  . folic acid  1 mg Oral Daily  . furosemide  40 mg Oral Daily  . isosorbide dinitrate  5 mg Oral TID  . levETIRAcetam  500 mg Oral BID  . magnesium oxide  400 mg Oral BID  . multivitamin with minerals  1 tablet Oral Daily  . oxyCODONE  20 mg Oral Q12H  . polyethylene glycol  17 g Per Tube Daily  . potassium chloride  40 mEq Oral Once  . pregabalin  75 mg Oral TID  . sacubitril-valsartan  1 tablet Oral BID  . silver sulfADIAZINE   Topical BID  . spironolactone  12.5 mg Oral Daily  . thiamine  100 mg Oral Daily   Continuous Infusions: . lactated ringers Stopped (01/02/21 1552)    Active Problems:   Ischemic stroke (HCC)   Basilar artery embolus   Acute on chronic combined systolic and diastolic CHF (congestive heart failure) (HCC)   Respiratory failure (HCC)   Ulcer of great toe, left, with necrosis of bone (HCC)   Ulcer of great toe, right, with necrosis of muscle (HCC)   Acute osteomyelitis of toe, left (HCC)   Osteomyelitis of great toe (HCC)   Shock liver   Acute metabolic encephalopathy   CAD (coronary artery disease)   HTN (hypertension)   Bipolar I disorder (HCC)   Cocaine abuse (HCC)   Homelessness   HLD  (hyperlipidemia)   Consultants:  Neurology  Cardiology  Podiatry  Palliative  Procedures: Bilateral great toe amputation  Antibiotics: Anti-infectives (From admission, onward)   Start     Dose/Rate Route Frequency Ordered Stop   01/05/21 1145  ciprofloxacin (CIPRO) tablet 500 mg        500 mg Oral 2 times daily 01/05/21 1057 01/19/21 0759   01/02/21 1406  vancomycin (VANCOCIN) powder  Status:  Discontinued          As needed 01/02/21 1406 01/02/21 1428   12/17/20 1450  ceFAZolin (ANCEF) 2-4 GM/100ML-% IVPB       Note to Pharmacy: Cristy Friedlanderusterman, Kyle   : cabinet override      12/17/20 1450 12/17/20 1911       Time spent: 20 minutes    Junious SilkAllison Baylea Milburn ANP  [facility] 7 am - 330 pm/M-F for direct patient care and secure chat Please refer to Amion for contact info 22  days

## 2021-01-09 ENCOUNTER — Encounter (HOSPITAL_COMMUNITY): Payer: Self-pay | Admitting: Neurology

## 2021-01-09 DIAGNOSIS — R471 Dysarthria and anarthria: Secondary | ICD-10-CM | POA: Diagnosis not present

## 2021-01-09 DIAGNOSIS — Z5900 Homelessness unspecified: Secondary | ICD-10-CM | POA: Diagnosis not present

## 2021-01-09 DIAGNOSIS — L97513 Non-pressure chronic ulcer of other part of right foot with necrosis of muscle: Secondary | ICD-10-CM | POA: Diagnosis not present

## 2021-01-09 DIAGNOSIS — K0889 Other specified disorders of teeth and supporting structures: Secondary | ICD-10-CM

## 2021-01-09 DIAGNOSIS — I639 Cerebral infarction, unspecified: Secondary | ICD-10-CM | POA: Diagnosis not present

## 2021-01-09 NOTE — Plan of Care (Signed)
Not progressing due to ongoing confusion and inability/refusal to cooperate in care plans and treatments with staff.   Problem: Education: Goal: Knowledge of General Education information will improve Description: Including pain rating scale, medication(s)/side effects and non-pharmacologic comfort measures Outcome: Not Progressing   Problem: Health Behavior/Discharge Planning: Goal: Ability to manage health-related needs will improve Outcome: Not Progressing   Problem: Clinical Measurements: Goal: Ability to maintain clinical measurements within normal limits will improve Outcome: Not Progressing Goal: Will remain free from infection Outcome: Not Progressing Goal: Diagnostic test results will improve Outcome: Not Progressing Goal: Respiratory complications will improve Outcome: Not Progressing Goal: Cardiovascular complication will be avoided Outcome: Not Progressing   Problem: Activity: Goal: Risk for activity intolerance will decrease Outcome: Not Progressing   Problem: Nutrition: Goal: Adequate nutrition will be maintained Outcome: Not Progressing   Problem: Coping: Goal: Level of anxiety will decrease Outcome: Not Progressing   Problem: Elimination: Goal: Will not experience complications related to bowel motility Outcome: Not Progressing Goal: Will not experience complications related to urinary retention Outcome: Not Progressing   Problem: Pain Managment: Goal: General experience of comfort will improve Outcome: Not Progressing   Problem: Safety: Goal: Ability to remain free from injury will improve Outcome: Not Progressing   Problem: Skin Integrity: Goal: Risk for impaired skin integrity will decrease Outcome: Not Progressing   Problem: Activity: Goal: Ability to tolerate increased activity will improve Outcome: Not Progressing

## 2021-01-09 NOTE — Progress Notes (Signed)
[facility] PROGRESS NOTE  [name] FrohlichCorey [name] ZOX:[id number] DOB: [birth year] DOA: 12/17/2020 PCP: [name], [name]  Status: Remains inpatient appropriate because:Ongoing active pain requiring inpatient pain management, Unsafe d/c plan and IV treatments appropriate due to intensity of illness or inability to take PO   Dispo:  The patient is from: homeless              Anticipated d/c is to: homeless shelter or streets-patient [name] living with "friends" after arrival              Anticipated d/c date is: > 3 days              Patient currently is not medically stable to d/c.  Barriers to discharge: Patient is homeless and currently has significant gait and ambulation issues secondary to recent bilateral amputation of toes.  This is further complicated by inadequate pain control.  And although patient has Medicaid he has a significant substance abuse history and therefore is unlikely to be accepted by nursing homes in the area.   Difficult to place patient Yes   Level of care: Med-Surg  Code Status: Full Family Communication: Patient only DVT prophylaxis: Lovenox Vaccination status: Does not appear to be vaccinated against Covid  Foley catheter: No  HPI: 55 year old male with past medical history of coronary artery disease, chronic systolic CHF, CVA scented with chest pain and became unresponsive with left-sided weakness and left gaze deviation.  Was intubated in the ER.  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  [facility]ub[facility] on 1/20 [facility]  Subjective: Awakened.  Reports significant improvement in pain.  Currently denies oral pain.  Able to better verbalize today in context of improvement in dysarthria and dystonia.  Objective: Vitals:   01/08/21 2309 01/09/21 0333  BP: 131/88 111/79  Pulse: 83 93   Resp: 18 18  Temp: 98.3 F (36.8 C) 98.5 F (36.9 C)  SpO2: 99% 99%    Intake/Output Summary (Last 24 hours) at 01/09/2021 [id number] Last data filed at 01/09/2021 0414 Gross per 24 hour  Intake 240 ml  Output 750 ml  Net -510 ml   Filed Weights   01/06/21 0500 01/08/21 0300 01/09/21 0333  Weight: 82.8 kg 85.3 kg 93.8 kg    Exam:  Constitutional: Awake, no acute distress, calm ENT: Upper palate edematous and pink, poor dentition and missing teeth, no odors or drainage detected, able to manage oral secretions no drooling Respiratory: Lung sounds clear posteriorly, stable on room air Cardiovascular: Normal heart sounds, no peripheral edema, pulse regular Abdomen: Soft nontender nondistended.  Normoactive bowel sounds.  LBM 2/9 Skin: Bilateral toe amputation site covered with dressings with no obvious drainage or bleeding Neurologic: CN 2-12 grossly intact. Sensation intact, Strength 4-5/5 x all 4 extremities.  Continues to have dystonic movements and some dysarthria but markedly improved as compared 2/8 Psychiatric: Awake and, oriented x3.  Baseline affect.   Assessment/Plan: Acute problems: Acute ischemic stroke-right superior and middle cerebellar peduncle -Reviewed CT head and MRI brain and MRA head and neck -EEG shows diffuse encephalopathy -Continue aspirin, statin and Plavix for 3 weeks and then aspirin alone as per neurology's recommendation -1/25: Due to persistent right-sided weakness and that he claims worsening-repeat CT head and MRI and EEG done which showed no acute findings. -A1c: 6.4%, LDL: 72, echo shows ejection fraction less  than 20% with grade 2 diastolic dysfunction -Neurology signed off -PT/OT recommended SNF but patient prefers to discharge with friends -According to PT note from 2/7 patient remains impulsive with no awareness of safety/deficits.  He is a high fall risk.  Stating he wants to go home and is again refusing SNF.  Recommend rolling walker and  wheelchair if he declines SNF.This has already been requested -Repeat MRI today on 2/8 as below-no acute infarct-suspect dysarthria and dystonia secondary to side effects from Lyrica as described below  Acute dysarthria with dystonia -Likely secondary to medications i.e. Lyrica-subsequently discontinued on 2/8 -With history of recent stroke obtain stat MRI which did not reveal any acute infarct but did reveal previous right cerebellar infarct no associated mass-effect and no hydrocephalus. -As of 2/9 dysarthria and dystonia markedly improved but not resolved so we will continue to monitor  Oral pain w/ upper palate edema -Orthopantogram unremarkable -As of 2/9 upper palate edema markedly improved  Bilateral great toe ulcers/osteomyelitis noted in left/status post multiple toe amputations: -S/p bilateral great toe amputation on 2/2 via podiatry -2/4 resumed DVT prophylaxis with Lovenox - continue OxyContin 20 mg every 12 hours, decrease OxyIR to 5 mg every 4 hours as needed for breakthrough pain, continue IV Dilaudid for severe pain.  **TOC benefit eligibility has determined that patient has Medicaid of [place]- OxyContin has a $4 co-pay for each prescription -Continue Cipro for a total of 2 weeks (wound culture positive for Proteus vulgaris resistant to ampicillin and intermediate to Unasyn resistant to cefazolin and intermediate to imipenem.  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  Hydralazine was also discontinued.  Continue Entresto; continue Aldactone for now-see below regarding possible Lasix adjustment -If orthostatic may  need to hold Lasix and monitor weights closely prior to discharge -Strict I/O's and daily weight -Follow-up with cardiology outpatient-appreciate cardiology's recommendation  Suspected Seizures: -EEG from 1/17 w/ diffuse encephalopathy, nonspecific etiology.  EEG from 1/19 showed evidence of epileptogenic city arising from right more than left frontal region. -EEG on 1/26 shows no seizures or epileptiform discharges were seen throughout the recording. -Continue Keppra 500 mg twice daily as per neurology's recommendations  Coronary artery disease: -Cardiology recommended outpatient cath at least 1 month after recovery from stroke. -Continue DAPT -Continue aspirin and atorvastatin  Physical deconditioning/gait instability -Last PT evaluation was on 2/3 prior to adjustments in pain medications.  Patient was having significant difficulty with mobility secondary to pain -He was also reporting dizziness over the weekend with standing but would not allow vital signs to be checked.  We will attempt to obtain orthostatic vital signs. -We will order rolling walker and wheelchair noting patient is at high risk for falls although he is making progress needing only minimal assist to come to stand with rolling walker and minimal to mod assist x2 to stand step to the right with a rolling walker    Other problems: Acute metabolic/toxic encephalopathy: -Multifactorial in the setting of stroke, drug use, respiratory failure and sedation -Resolved -Continue folic acid, thiamine and multivitamin -Seen by psychiatry-appreciate help  Hypertension:  Blood pressure 140s/100s-likely secondary to pain with significant improvement in hypertension with improved pain control as above. -The above regarding  adjustments of antihypertensive medications -Monitor blood pressure closely  Acute transaminitis -LFTs as of 2/3 are within normal limits except for low albumin of 3.1 -Elevated LFTs likely related to  shock liver phases of admission the mild elevation of total bilirubin to 1.7 therefore not consistent with acute liver failure  Cocaine use:  Counseled about cessation  Hyponatremia:  Resolved  Bipolar disorder/homelessness: -Poor compliance.  Homelessness.   Consulted psychiatry-evaluated and cleared him a danger to himself or to others. -Continue Depakote  Hyperlipidemia:  Continue statin.    Data Reviewed: Basic Metabolic Panel: Recent Labs  Lab 01/03/21 0821 01/04/21 0440 01/06/21 0208 01/06/21 1113 01/07/21 0222  NA 137  --  134* 135  --   K 4.2  --  3.9 3.9  --   CL 101  --  100 99  --   CO2 26  --  23 24  --   GLUCOSE 113*  --  154* 148*  --   BUN 19  --  12 7  --   CREATININE 1.01  --  1.03 0.89  --   CALCIUM 9.1  --  8.6* 8.9  --   MG 1.8 1.8 1.8  --  2.1   Liver Function Tests: Recent Labs  Lab 01/03/21 0821  AST 20  ALT 25  ALKPHOS 63  BILITOT 1.0  PROT 7.7  ALBUMIN 3.1*   No results for input(s): LIPASE, AMYLASE in the last 168 hours. No results for input(s): AMMONIA in the last 168 hours. CBC: Recent Labs  Lab 01/03/21 0821 01/04/21 0440 01/06/21 0208 01/08/21 1002  WBC 10.1 9.2 8.9 7.2  NEUTROABS  --   --   --  3.4  HGB 13.8 13.4 12.5* 13.9  HCT 40.9 40.7 38.6* 41.3  MCV 94.0 93.3 94.8 92.0  PLT 310 270 231 288   Cardiac Enzymes: No results for input(s): CKTOTAL, CKMB, CKMBINDEX, TROPONINI in the last 168 hours. BNP (last 3 results) No results for input(s): BNP in the last 8760 hours.  ProBNP (last 3 results) No results for input(s): PROBNP in the last 8760 hours.  CBG: Recent Labs  Lab 01/06/21 1245 01/06/21 1542 01/06/21 2021 01/06/21 2351 01/07/21 0800  GLUCAP 116* 102* 158* 171* 158*    Recent Results (from the past 240 hour(s))  Surgical pcr screen     Status: None   Collection Time: 01/01/21  8:08 PM   Specimen: Nasal Mucosa; Nasal Swab  Result Value Ref Range Status   MRSA, PCR NEGATIVE NEGATIVE Final    Staphylococcus aureus NEGATIVE NEGATIVE Final    Comment: (NOTE) The Xpert SA Assay (FDA approved for NASAL specimens in patients 8 years of age and older), is one component of a comprehensive surveillance program. It is not intended to diagnose infection nor to guide or monitor treatment. Performed at [facility], [address] [place], [name] [address]   Aerobic/Anaerobic Culture (surgical/deep wound)     Status: None   Collection Time: 01/02/21  1:52 PM   Specimen: Wound  Result Value Ref Range Status   Specimen Description WOUND  Final   Special Requests LEFT TOE SPEC A  Final   Gram Stain   Final    NO WBC SEEN FEW GRAM POSITIVE COCCI RARE GRAM NEGATIVE RODS Performed at [name], [address], [place], [name] [address]    Culture   Final    FEW PROTEUS VULGARIS MIXED ANAEROBIC FLORA PRESENT.  CALL LAB IF FURTHER  IID REQUIRED.    Report Status 01/07/2021 FINAL  Final   Organism ID, Bacteria PROTEUS VULGARIS  Final      Susceptibility   Proteus vulgaris - MIC*    AMPICILLIN >=32 RESISTANT Resistant     CEFAZOLIN >=64 RESISTANT Resistant     CEFEPIME <=0.12 SENSITIVE Sensitive     CEFTAZIDIME <=1 SENSITIVE Sensitive     CIPROFLOXACIN <=0.25 SENSITIVE Sensitive     GENTAMICIN <=1 SENSITIVE Sensitive     IMIPENEM 8 INTERMEDIATE Intermediate     TRIMETH/SULFA <=20 SENSITIVE Sensitive     AMPICILLIN/SULBACTAM 16 INTERMEDIATE Intermediate     PIP/TAZO <=4 SENSITIVE Sensitive     * FEW PROTEUS VULGARIS     Studies: DG Orthopantogram  Result Date: 01/08/2021 CLINICAL DATA:  Left-sided jaw pain EXAM: ORTHOPANTOGRAM/PANORAMIC COMPARISON:  None. FINDINGS: No acute fracture or dislocation is noted. Several teeth are missing with evidence of significant dental caries involving several of the remaining teeth. No definitive periapical abscess is noted. IMPRESSION: No acute bony abnormality is noted. Patient has several missing teeth with changes of dental  caries within several remaining teeth. Electronically Signed   By: Alcide Clever M.D.   On: 01/08/2021 09:15   MR BRAIN WO CONTRAST  Result Date: 01/08/2021 CLINICAL DATA:  Mental status change. EXAM: MRI HEAD WITHOUT CONTRAST TECHNIQUE: Multiplanar, multiecho pulse sequences of the brain and surrounding structures were obtained without intravenous contrast. COMPARISON:  MRI head 12/17/2020. FINDINGS: Brain: Evolving encephalomalacia associated with a subacute right superior cerebellar infarct with some mild persistent restricted diffusion in this region. The extent of restricted diffusion appears similar to prior MRI. No definite new areas of restricted diffusion. No substantial mass effect. The fourth ventricle is widely patent. No hydrocephalus. No acute hemorrhage. No mass lesion. No midline shift. Basal cisterns are patent. Remote left cerebellar infarct with encephalomalacia. Mild to moderate patchy white matter T2/FLAIR hyperintensities, likely the sequela of chronic microvascular ischemic disease. Vascular: Attenuated left distal vertebral artery flow void. See prior MRA for vascular evaluation. Skull and upper cervical spine: Normal marrow signal. Sinuses/Orbits: Left maxillary sinus mucosal thickening. Remote left medial orbital wall fracture. Other: No mastoid effusions. IMPRESSION: 1. Evolving right cerebellar infarct without evidence of interval new acute infarct. No substantial mass effect. The fourth ventricle is patent and there is no evidence of hydrocephalus. 2. Remote left cerebellar infarct. 3. Attenuated left distal vertebral artery flow void. See prior MRA for vascular evaluation. 4. Mild to moderate chronic microvascular ischemic disease. Electronically Signed   By: Feliberto Harts MD   On: 01/08/2021 11:51    Scheduled Meds: . (feeding supplement) PROSource Plus  30 mL Oral BID BM  . aspirin  81 mg Oral Daily  . atorvastatin  40 mg Oral Daily  . carvedilol  6.25 mg Oral BID WC  .  ciprofloxacin  500 mg Oral BID  . clopidogrel  75 mg Oral Daily  . collagenase   Topical Daily  . divalproex  500 mg Oral Daily  . enoxaparin (LOVENOX) injection  40 mg Subcutaneous Q24H  . feeding supplement  237 mL Oral BID BM  . folic acid  1 mg Oral Daily  . furosemide  40 mg Oral Daily  . isosorbide dinitrate  5 mg Oral TID  . levETIRAcetam  500 mg Oral BID  . magnesium oxide  400 mg Oral BID  . multivitamin with minerals  1 tablet Oral Daily  . oxyCODONE  20 mg Oral Q12H  . polyethylene glycol  17 g Per Tube Daily  . sacubitril-valsartan  1 tablet Oral BID  . silver sulfADIAZINE   Topical BID  . spironolactone  12.5 mg Oral Daily  . thiamine  100 mg Oral Daily   Continuous Infusions: . lactated ringers Stopped (01/02/21 1552)    Active Problems:   Ischemic stroke (HCC)   Basilar artery embolus   Acute on chronic combined systolic and diastolic CHF (congestive heart failure) (HCC)   Respiratory failure (HCC)   Ulcer of great toe, left, with necrosis of bone (HCC)   Ulcer of great toe, right, with necrosis of muscle (HCC)   Acute osteomyelitis of toe, left (HCC)   Osteomyelitis of great toe (HCC)   Shock liver   Acute metabolic encephalopathy   CAD (coronary artery disease)   HTN (hypertension)   Bipolar I disorder (HCC)   Cocaine abuse (HCC)   Homelessness   HLD (hyperlipidemia)   Dystonia   Dysarthria   Consultants:  Neurology  Cardiology  Podiatry  Palliative  Procedures: Bilateral great toe amputation  Antibiotics: Anti-infectives (From admission, onward)   Start     Dose/Rate Route Frequency Ordered Stop   01/05/21 1145  ciprofloxacin (CIPRO) tablet 500 mg        500 mg Oral 2 times daily 01/05/21 1057 01/19/21 0759   01/02/21 1406  vancomycin (VANCOCIN) powder  Status:  Discontinued          As needed 01/02/21 1406 01/02/21 1428   12/17/20 1450  ceFAZolin (ANCEF) 2-4 GM/100ML-% IVPB       Note to Pharmacy: Cristy Friedlander   : cabinet override       12/17/20 1450 12/17/20 1911       Time spent: 20 minutes    Junious Silk ANP  [facility] 7 am - 330 pm/M-F for direct patient care and secure chat Please refer to Amion for contact info 23  days

## 2021-01-09 NOTE — Progress Notes (Signed)
OT Cancellation Note  Patient Details Name: [name] MRN: [id number] DOB: 02/10/1966   Cancelled Treatment:    Reason Eval/Treat Not Completed: Patient declined, no reason specified;Other (comment)Upon COTA arrival pt seemed willing to participate in session however pt with sudden change in personality stating, " get away from me." Will check back as time allows for OT session pending pts willing participation.   Pollyann Glen K., COTA/L Acute Rehabilitation Services [phone]'   Barron Schmid 01/09/2021, 5:13 PM

## 2021-01-09 NOTE — Progress Notes (Signed)
Physical Therapy Treatment Patient Details Name: [name] MRN: [id number] DOB: 1966/04/23 Today's Date: 01/09/2021    History of Present Illness Homeless 55 yo male found down with c/o CP by bystanders. 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 Extubated 1/20. S/p bil great toe amputations 01/02/21.    PT Comments    Pt required HHA and modA to initiate bed mobility and gain sitting balance initially this date due to poor attention span and ability to follow directions. Once sitting, pt began to perseverate on "I want to see my psychiatrist" and would not follow directions to come to stand with PT despite multiple techniques and max education provided to redirect pt and encourage him to participate to maximize his safety and independence with functional mobility in prep for d/c. Pt outwardly refused to participate further in session and returned to supine in bed. Notified RN. Will continue to follow acutely. Current recommendations remain appropriate.  Follow Up Recommendations  SNF;Supervision/Assistance - 24 hour (pt refusing)     Equipment Recommendations  Wheelchair (measurements PT);Wheelchair cushion (measurements PT);Rolling walker with 5" wheels    Recommendations for Other Services       Precautions / Restrictions Precautions Precautions: Fall Precaution Comments: orthostatic hypotension; pt easily agitated due to psych h/o, is very unsafe; s/p bil great toe amputation 01/02/21 Restrictions Weight Bearing Restrictions: Yes RLE Weight Bearing: Weight bearing as tolerated LLE Weight Bearing: Weight bearing as tolerated Other Position/Activity Restrictions: Post-op shoes present in room    Mobility  Bed Mobility Overal bed mobility: Needs Assistance Bed Mobility: Supine to Sit     Supine  to sit: Mod assist     General bed mobility comments: Bed flat, cuing pt to come sit R EOB with no initiation from pt with VCs alone. Provided HHA for pt to pull and assist with transition, modA.  Transfers                 General transfer comment: Pt refused to stand, perseverating on wanting to see his psychiatrist, despite being encouraged and coaxed to participate to improve mobility independence in prep for d/c.  Ambulation/Gait             General Gait Details: Pt refused to stand and walk, perseverating on wanting to see his psychiatrist, despite being encouraged and coaxed to participate to improve mobility independence in prep for d/c.   Stairs             Wheelchair Mobility    Modified Rankin (Stroke Patients Only) Modified Rankin (Stroke Patients Only) Pre-Morbid Rankin Score: No symptoms Modified Rankin: Moderately severe disability     Balance Overall balance assessment: Needs assistance Sitting-balance support: Feet supported;Single extremity supported Sitting balance-Leahy Scale: Poor Sitting balance - Comments: Initially requiring modA to keep balance due to intermittent R lateral LOB, progressed to min guard for safety with R UE support on bed. Postural control: Right lateral lean                                  Cognition Arousal/Alertness: Awake/alert Behavior During Therapy: Impulsive;Agitated Overall Cognitive Status: Impaired/Different from baseline Area of Impairment: Attention;Memory;Following commands;Safety/judgement;Awareness;Problem solving                   Current  Attention Level: Selective Memory: Decreased short-term memory;Decreased recall of precautions Following Commands: Follows one step commands with increased time;Follows one step commands inconsistently Safety/Judgement: Decreased awareness of safety;Decreased awareness of deficits Awareness: Emergent Problem Solving: Slow processing;Decreased  initiation;Requires verbal cues;Difficulty sequencing;Requires tactile cues General Comments: Pt impulsive and having difficulty maintaining attention to cues, needing them to be repeated often. Cues to sequence tasks and for safety. Pt with poor reactional strategies. Pt wavered from cooperative to agitated, refusing PT, and perseverating on "I want my psychiatrist".      Exercises      General Comments General comments (skin integrity, edema, etc.): Denies dizziness/lightheadedness coming to sit EOB      Pertinent Vitals/Pain Pain Assessment: Faces Faces Pain Scale: Hurts a little bit Pain Location: Generalized with mobility Pain Descriptors / Indicators: Sore;Operative site guarding;Grimacing Pain Intervention(s): Limited activity within patient's tolerance;Monitored during session;Repositioned;Premedicated before session    Home Living                      Prior Function            PT Goals (current goals can now be found in the care plan section) Acute Rehab PT Goals Patient Stated Goal: to see psychiatrist PT Goal Formulation: With patient Time For Goal Achievement: 01/17/21 Potential to Achieve Goals: Fair Progress towards PT goals: Not progressing toward goals - comment (limited by agitation this date)    Frequency    Min 3X/week      PT Plan Current plan remains appropriate    Co-evaluation              AM-PAC PT "6 Clicks" Mobility   Outcome Measure  Help needed turning from your back to your side while in a flat bed without using bedrails?: None Help needed moving from lying on your back to sitting on the side of a flat bed without using bedrails?: A Lot Help needed moving to and from a bed to a chair (including a wheelchair)?: A Lot Help needed standing up from a chair using your arms (e.g., wheelchair or bedside chair)?: A Lot Help needed to walk in hospital room?: A Lot Help needed climbing 3-5 steps with a railing? : Total 6 Click  Score: 13    End of Session Equipment Utilized During Treatment: Gait belt Activity Tolerance: Treatment limited secondary to agitation Patient left: in bed;with call bell/phone within reach;with bed alarm set Nurse Communication: Mobility status;Other (comment) (refusal of PT and request for pyschiatrist) PT Visit Diagnosis: Unsteadiness on feet (R26.81);Other symptoms and signs involving the nervous system (R29.898);Other abnormalities of gait and mobility (R26.89);Ataxic gait (R26.0);Difficulty in walking, not elsewhere classified (R26.2)     Time: 5631-4970 PT Time Calculation (min) (ACUTE ONLY): 15 min  Charges:  $Therapeutic Activity: 8-22 mins                     Raymond Gurney, PT, DPT Acute Rehabilitation Services  Pager: [phone] Office: [phone]    Jewel Baize 01/09/2021, 10:27 AM

## 2021-01-10 DIAGNOSIS — I5043 Acute on chronic combined systolic (congestive) and diastolic (congestive) heart failure: Secondary | ICD-10-CM | POA: Diagnosis not present

## 2021-01-10 DIAGNOSIS — I639 Cerebral infarction, unspecified: Secondary | ICD-10-CM | POA: Diagnosis not present

## 2021-01-10 DIAGNOSIS — M869 Osteomyelitis, unspecified: Secondary | ICD-10-CM | POA: Diagnosis not present

## 2021-01-10 NOTE — TOC Progression Note (Signed)
Transition of Care [facility]) - Progression Note    Patient Details  Name: Harm Jou MRN: [id number] Date of Birth: [birth year]  [facility] CM/SW [employer], RN Phone Number: 01/10/2021, 1:44 PM  Clinical Narrative:    Case management met with the patient at the bedside regarding transitions of care.  The patient plans to discharge to homeless shelter or back to live with various friends in the [place] area.  The patient complains of pain in roof of mouth along with difficulty expressing thoughts and words this morning during our conversation.  Erin Hearing, NP is present at the bedside and plans to order labs and/or tests relating to patient's current symptoms.  The patient is not currently stable to discharge at this time.  CM and MSW will continue to follow the patient for discharge planning once he is able to discharge home.  The patient will need a rolling walker and WC and medications filled at [place] when he is safe to discharge at a later date.   Expected Discharge Plan: [place] Barriers to Discharge: Homeless with medical needs,Continued Medical Work up  Expected Discharge Plan and Services Expected Discharge Plan: [place] In-house Referral: Clinical Social Work Discharge Planning Services: CM Consult,Indigent Health Clinic,Medication Assistance Post Acute Care Choice: Durable Medical Equipment Living arrangements for the past 2 months: Homeless,No permanent address                 DME Arranged: Walker rolling,Wheelchair manual DME Agency: [employer]     Representative spoke with at DME Agency: Will contact [employer] to order and deliver when closer to discharge to home.             Social Determinants of Health (SDOH) Interventions    Readmission Risk Interventions Readmission Risk Prevention Plan 01/07/2021  Transportation Screening Complete  PCP or Specialist Appt within 3-5 Days Complete   HRI or [place] Complete  Social Work Consult for [place] Planning/Counseling Complete  Palliative Care Screening Complete  Medication Review [employer]) Complete

## 2021-01-10 NOTE — Progress Notes (Signed)
Occupational Therapy Treatment Patient Details Name: [name] MRN: [id number] DOB: Jul 03, 1966 Today's Date: 01/10/2021    History of present illness Homeless 55 yo male found down with c/o CP by bystanders. 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 Extubated 1/20. S/p bil great toe amputations 01/02/21.   OT comments  Patient met lying supine in bed. Patient and bed linen saturated in urine. Patient able to come to sitting position at EOB with supervision A for safety. While OT changed bed linen, patient maintained static sitting balance at EOB. LOB noted with reaching outside of BOS to assist therapist with task. Patient declining bathing/dressing with no reason specified despite education on risk of skin breakdown. Patient also declined grooming tasks seated EOB. Noted dysarthria more pronounced than during treatment sessions prior to 8/2. Patient still able to make needs known. Per MD, MRI (-) for acute findings with dysarthria and dystonia 2/2 [place]. Patient continues to be limited by decreased safety awareness, impulsivity, decreased dynamic sitting balance, decreased standing balance, and external assist from others for OOB ADLs. OT will continue to follow acutely.    Follow Up Recommendations  SNF;Supervision/Assistance - 24 hour    Equipment Recommendations  3 in 1 bedside commode;Wheelchair (measurements OT);Wheelchair cushion (measurements OT)    Recommendations for Other Services      Precautions / Restrictions Precautions Precautions: Fall Precaution Comments: WBAT BLE w/ post-op shoes, easily agitated, decreased safety awareness; s/p bil great toe amputation 01/02/21 Restrictions Weight Bearing Restrictions: Yes RLE Weight Bearing: Weight bearing as tolerated LLE Weight Bearing: Weight  bearing as tolerated       Mobility Bed Mobility Overal bed mobility: Needs Assistance Bed Mobility: Supine to Sit Rolling: Supervision     Sit to supine: Supervision   General bed mobility comments: Bed flat, no cues required. Cues for safety.  Transfers                 General transfer comment: Patient declined. No reason specified.    Balance Overall balance assessment: Needs assistance Sitting-balance support: Bilateral upper extremity supported;Feet unsupported Sitting balance-Leahy Scale: Fair Sitting balance - Comments: Patient able to maintain static sitting at EOB with Min guard for safety. Postural control: Right lateral lean                                 ADL either performed or assessed with clinical judgement   ADL                   Upper Body Dressing : Minimal assistance;Sitting                     General ADL Comments: Declined bathing tasks. No reason specified.     Vision       Perception     Praxis      Cognition Arousal/Alertness: Awake/alert Behavior During Therapy: Impulsive;Agitated Overall Cognitive Status: Impaired/Different from baseline Area of Impairment: Attention;Memory;Following commands;Safety/judgement;Awareness;Problem solving                   Current Attention Level: Selective Memory: Decreased short-term memory;Decreased recall of precautions Following Commands: Follows one step commands with increased time;Follows one step commands inconsistently Safety/Judgement: Decreased awareness of safety;Decreased awareness of deficits Awareness: Emergent Problem Solving:  Slow processing;Decreased initiation;Requires verbal cues;Difficulty sequencing;Requires tactile cues General Comments: Impulsive, very poor safety awareness.        Exercises     Shoulder Instructions       General Comments Dysarthric but able to make needs known.    Pertinent Vitals/ Pain       Pain  Assessment: Faces Pain Score: 0-No pain Faces Pain Scale: Hurts even more Pain Location: Generalized with movement Pain Descriptors / Indicators: Sore;Operative site guarding;Grimacing Pain Intervention(s): Limited activity within patient's tolerance;Premedicated before session  Home Living                                          Prior Functioning/Environment              Frequency  Min 2X/week        Progress Toward Goals  OT Goals(current goals can now be found in the care plan section)  Progress towards OT goals: Not progressing toward goals - comment (Patient declining OOB activity this date.)  Acute Rehab OT Goals Patient Stated Goal: Did not state. OT Goal Formulation: With patient Time For Goal Achievement: 01/17/21 Potential to Achieve Goals: Good ADL Goals Pt Will Perform Eating: Independently;sitting Pt Will Perform Grooming: sitting;standing;with min guard assist Pt Will Perform Upper Body Bathing: with min guard assist;sitting Pt Will Perform Lower Body Bathing: with min assist;sit to/from stand Pt Will Perform Upper Body Dressing: with min guard assist;sitting Pt Will Perform Lower Body Dressing: with min assist;sit to/from stand Pt Will Transfer to Toilet: with min assist;ambulating;bedside commode Pt Will Perform Toileting - Clothing Manipulation and hygiene: with min assist;sit to/from stand Additional ADL Goal #1: Pt will be S in and OOB for basic ADLs Additional ADL Goal #2: Pt will be able to locate all items he needs for task without cues  Plan Discharge plan remains appropriate;Frequency remains appropriate    Co-evaluation                 AM-PAC OT "6 Clicks" Daily Activity     Outcome Measure   Help from another person eating meals?: A Lot Help from another person taking care of personal grooming?: A Lot Help from another person toileting, which includes using toliet, bedpan, or urinal?: A Lot Help from another  person bathing (including washing, rinsing, drying)?: A Lot Help from another person to put on and taking off regular upper body clothing?: A Little Help from another person to put on and taking off regular lower body clothing?: A Little 6 Click Score: 14    End of Session    OT Visit Diagnosis: Unsteadiness on feet (R26.81);Other abnormalities of gait and mobility (R26.89);Muscle weakness (generalized) (M62.81);Other symptoms and signs involving cognitive function;Hemiplegia and hemiparesis Hemiplegia - Right/Left: Right Hemiplegia - dominant/non-dominant: Dominant Hemiplegia - caused by: Cerebral infarction   Activity Tolerance Other (comment) (Declined OOB activity this date. No reason specified.)   Patient Left in bed;with call bell/phone within reach;with bed alarm set   Nurse Communication          Time: 9480-1655 OT Time Calculation (min): 15 min  Charges: OT General Charges $OT Visit: 1 Visit OT Treatments $Self Care/Home Management : 8-22 mins  [name] H. OTR/L Supplemental OT, Department of rehab services [phone]   [name] R H. 01/10/2021, 2:08 PM

## 2021-01-10 NOTE — Progress Notes (Signed)
[facility] PROGRESS NOTE  [name] VFI:[id number] DOB: 04/26/1966 DOA: 12/17/2020 PCP: [name]  Status: Remains inpatient appropriate because:Ongoing active pain requiring inpatient pain management, Unsafe d/c plan and IV treatments appropriate due to intensity of illness or inability to take PO   Dispo:  The patient is from: homeless              Anticipated d/c is to: homeless shelter or streets-patient plans on taking a bus from [place] to [place] and living with "friends" after arrival              Anticipated d/c date is: > 3 days              Patient currently is not medically stable to d/c.  Barriers to discharge: Patient is homeless and currently has significant gait and ambulation issues secondary to recent bilateral amputation of toes.  This is further complicated by inadequate pain control.  And although patient has Medicaid he has a significant substance abuse history and therefore is unlikely to be accepted by nursing homes in the area.  As of 2/10 patient continues to have suboptimal blood pressure readings requiring adjustments in his cardiac medications.   Difficult to place patient Yes   Level of care: Med-Surg  Code Status: Full Family Communication: Patient only DVT prophylaxis: Lovenox Vaccination status: Does not appear to be vaccinated against Covid  Foley catheter: No  HPI: 55 year old male with past medical history of coronary artery disease, chronic systolic CHF, CVA scented with chest pain and became unresponsive with left-sided weakness and left gaze deviation.  Was intubated in the ER.  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  Extubated on 1/20 and transferred to [facility] service on 1/21.  Subjective: Awakened.  Denied oral pain.  States wound pain also markedly improved.  Objective: Vitals:   01/09/21 2319 01/10/21 0739  BP:  95/68 (!) 149/93  Pulse: 89 84  Resp: 19 18  Temp:  98.1 F (36.7 C)  SpO2: 97% 98%    Intake/Output Summary (Last 24 hours) at 01/10/2021 0749 Last data filed at 01/09/2021 2040 Gross per 24 hour  Intake --  Output 200 ml  Net -200 ml   Filed Weights   01/08/21 0300 01/09/21 0333 01/10/21 0301  Weight: 85.3 kg 93.8 kg 95.3 kg    Exam:  Constitutional: Sleeping but awakened.  Calm. ENT: Upper palate edema resolved Respiratory: Lung sounds clear, stable on room air Cardiovascular: Heart sounds normal, pulse regular, no peripheral edema, extremities warm to touch Abdomen: Soft, nontender.  Bowel sounds present.  LBM 2/9 Skin: Bilateral toe amputation site covered with dressings-clean dry and intact without any bleeding. Neurologic: CN 2-12 grossly intact. Sensation intact, Strength 4-5/5 x all 4 extremities.  Continues to have dystonic movements and some dysarthria but markedly improved as compared 2/8 Psychiatric: Awake and, oriented x3.  Baseline affect.   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 -A1c: 6.4%, LDL: 72, echo shows ejection fraction less than 20% with grade 2 diastolic dysfunction -Neurology signed off -PT/OT recommended SNF but patient prefers to discharge  with friends -According to PT note from 2/7 patient remains impulsive with no awareness of safety/deficits.  He is a high fall risk.  Stating he wants to go home and is again refusing SNF.  Recommend rolling walker and wheelchair if he declines SNF.This has already been requested -Repeat MRI today on 2/8 as below-no acute infarct-suspect  dysarthria and dystonia secondary to side effects from Lyrica as described below  Acute dysarthria with dystonia -MRI negative for acute 5 -Symptoms resolved after discontinuation of Lyrica  Bilateral great toe ulcers/osteomyelitis noted in left/status post multiple toe amputations: -S/p bilateral great toe amputation on 2/2 via podiatry -2/4 resumed DVT prophylaxis with Lovenox - continue OxyContin 20 mg every 12 hours, decrease OxyIR to 5 mg every 4 hours as needed for breakthrough pain, continue IV Dilaudid for severe pain.  **TOC benefit eligibility has determined [place]- OxyContin has a $4 co-pay for each prescription -Continue Cipro for a total of 2 weeks (wound culture positive for Proteus vulgaris resistant to ampicillin and intermediate to Unasyn resistant to cefazolin and intermediate to imipenem.  Sensitive to Cipro.) -2/8 given acute dysarthria and dystonia have opted to discontinue Lyrica for now  Chronic combined systolic and diastolic heart failure with ejection fraction of less than 20% and grade 2 diastolic dysfunction: -Likely cocaine induced -Remains euvolemic although continues to have suboptimal blood pressure readings -Continue low-dose Coreg, on 2/4 given suboptimal blood pressure reading Imdur changed to shorter acting low-dose isosorbide.  Hydralazine was also discontinued.  Continue Entresto; continue Aldactone -2/10 given ongoing suboptimal blood pressure readings Lasix discontinued -Strict I/O's and daily weight -Follow-up with cardiology outpatient-appreciate cardiology's recommendation  Suspected Seizures: -EEG from 1/17 w/ diffuse encephalopathy, nonspecific etiology.  EEG from 1/19 showed evidence of epileptogenicity arising from right more than left frontal region. -EEG on 1/26 shows no seizures or epileptiform discharges were seen throughout the recording. -Continue Keppra 500 mg twice daily as per neurology's  recommendations  Coronary artery disease: -Cardiology recommended outpatient cath at least 1 month after recovery from stroke. -Continue DAPT -Continue aspirin and atorvastatin  Physical deconditioning/gait instability -Last PT evaluation was on 2/3 prior to adjustments in pain medications.  Patient was having significant difficulty with mobility secondary to pain -He was also reporting dizziness over the weekend with standing but would not allow vital signs to be checked.  We will attempt to obtain orthostatic vital signs. -We will order rolling walker and wheelchair noting patient is at high risk for falls although he is making progress needing only minimal assist to come to stand with rolling walker and minimal to mod assist x2 to stand step to the right with a rolling walker    Other problems: Acute metabolic/toxic encephalopathy: -Multifactorial in the setting of stroke, drug use, respiratory failure and sedation -Resolved -Continue folic acid, thiamine and multivitamin -Seen by psychiatry-appreciate help  Hypertension:  Blood pressure 140s/100s-likely secondary to pain with significant improvement in hypertension with improved pain control as above. -The above regarding adjustments of antihypertensive medications -Monitor blood pressure closely  Acute transaminitis -LFTs as of 2/3 are within normal limits except for low albumin of 3.1 -Elevated LFTs likely related to shock liver phases of admission the mild elevation of total bilirubin to 1.7 therefore not consistent with acute liver failure  Oral pain w/ upper palate edema -Orthopantogram unremarkable -As of 2/10 is resolved  Cocaine use:  Counseled about cessation  Hyponatremia:  Resolved  Bipolar disorder/homelessness: -Poor compliance.  Homelessness.  Consulted psychiatry-evaluated and cleared him a danger to himself or to others. -Continue Depakote  Hyperlipidemia:  Continue statin.    Data  Reviewed: Basic Metabolic Panel: Recent Labs  Lab 01/03/21 0821 01/04/21 0440 01/06/21 0208 01/06/21 1113 01/07/21 0222  NA 137  --  134* 135  --   K 4.2  --  3.9 3.9  --   CL 101  --  100 99  --   CO2 26  --  23 24  --   GLUCOSE 113*  --  154* 148*  --   BUN 19  --  12 7  --   CREATININE 1.01  --  1.03 0.89  --   CALCIUM 9.1  --  8.6* 8.9  --   MG 1.8 1.8 1.8  --  2.1   Liver Function Tests: Recent Labs  Lab 01/03/21 0821  AST 20  ALT 25  ALKPHOS 63  BILITOT 1.0  PROT 7.7  ALBUMIN 3.1*   No results for input(s): LIPASE, AMYLASE in the last 168 hours. No results for input(s): AMMONIA in the last 168 hours. CBC: Recent Labs  Lab 01/03/21 0821 01/04/21 0440 01/06/21 0208 01/08/21 1002  WBC 10.1 9.2 8.9 7.2  NEUTROABS  --   --   --  3.4  HGB 13.8 13.4 12.5* 13.9  HCT 40.9 40.7 38.6* 41.3  MCV 94.0 93.3 94.8 92.0  PLT 310 270 231 288   Cardiac Enzymes: No results for input(s): CKTOTAL, CKMB, CKMBINDEX, TROPONINI in the last 168 hours. BNP (last 3 results) No results for input(s): BNP in the last 8760 hours.  ProBNP (last 3 results) No results for input(s): PROBNP in the last 8760 hours.  CBG: Recent Labs  Lab 01/06/21 1245 01/06/21 1542 01/06/21 2021 01/06/21 2351 01/07/21 0800  GLUCAP 116* 102* 158* 171* 158*    Recent Results (from the past 240 hour(s))  Surgical pcr screen     Status: None   Collection Time: 01/01/21  8:08 PM   Specimen: Nasal Mucosa; Nasal Swab  Result Value Ref Range Status   MRSA, PCR NEGATIVE NEGATIVE Final   Staphylococcus aureus NEGATIVE NEGATIVE Final    Comment: (NOTE) The Xpert SA Assay (FDA approved for NASAL specimens in patients 48 years of age and older), is one component of a comprehensive surveillance program. It is not intended to diagnose infection nor to guide or monitor treatment. Performed at [facility], [address] [place], [name] [address]   Aerobic/Anaerobic Culture (surgical/deep  wound)     Status: None   Collection Time: 01/02/21  1:52 PM   Specimen: Wound  Result Value Ref Range Status   Specimen Description WOUND  Final   Special Requests LEFT TOE SPEC A  Final   Gram Stain   Final    NO WBC SEEN FEW GRAM POSITIVE COCCI RARE GRAM NEGATIVE RODS Performed at [facility], [address] [place], [name] [address]    Culture   Final    FEW PROTEUS VULGARIS MIXED ANAEROBIC FLORA PRESENT.  CALL LAB IF FURTHER IID REQUIRED.    Report Status 01/07/2021 FINAL  Final   Organism ID, Bacteria PROTEUS VULGARIS  Final      Susceptibility   Proteus vulgaris - MIC*    AMPICILLIN >=32 RESISTANT Resistant     CEFAZOLIN >=64 RESISTANT Resistant     CEFEPIME <=0.12 SENSITIVE Sensitive     CEFTAZIDIME <=1 SENSITIVE Sensitive     CIPROFLOXACIN <=0.25 SENSITIVE  Sensitive     GENTAMICIN <=1 SENSITIVE Sensitive     IMIPENEM 8 INTERMEDIATE Intermediate     TRIMETH/SULFA <=20 SENSITIVE Sensitive     AMPICILLIN/SULBACTAM 16 INTERMEDIATE Intermediate     PIP/TAZO <=4 SENSITIVE Sensitive     * FEW PROTEUS VULGARIS     Studies: DG Orthopantogram  Result Date: 01/08/2021 CLINICAL DATA:  Left-sided jaw pain EXAM: ORTHOPANTOGRAM/PANORAMIC COMPARISON:  None. FINDINGS: No acute fracture or dislocation is noted. Several teeth are missing with evidence of significant dental caries involving several of the remaining teeth. No definitive periapical abscess is noted. IMPRESSION: No acute bony abnormality is noted. Patient has several missing teeth with changes of dental caries within several remaining teeth. Electronically Signed   By: Alcide Clever M.D.   On: 01/08/2021 09:15   MR BRAIN WO CONTRAST  Result Date: 01/08/2021 CLINICAL DATA:  Mental status change. EXAM: MRI HEAD WITHOUT CONTRAST TECHNIQUE: Multiplanar, multiecho pulse sequences of the brain and surrounding structures were obtained without intravenous contrast. COMPARISON:  MRI head 12/17/2020. FINDINGS: Brain: Evolving  encephalomalacia associated with a subacute right superior cerebellar infarct with some mild persistent restricted diffusion in this region. The extent of restricted diffusion appears similar to prior MRI. No definite new areas of restricted diffusion. No substantial mass effect. The fourth ventricle is widely patent. No hydrocephalus. No acute hemorrhage. No mass lesion. No midline shift. Basal cisterns are patent. Remote left cerebellar infarct with encephalomalacia. Mild to moderate patchy white matter T2/FLAIR hyperintensities, likely the sequela of chronic microvascular ischemic disease. Vascular: Attenuated left distal vertebral artery flow void. See prior MRA for vascular evaluation. Skull and upper cervical spine: Normal marrow signal. Sinuses/Orbits: Left maxillary sinus mucosal thickening. Remote left medial orbital wall fracture. Other: No mastoid effusions. IMPRESSION: 1. Evolving right cerebellar infarct without evidence of interval new acute infarct. No substantial mass effect. The fourth ventricle is patent and there is no evidence of hydrocephalus. 2. Remote left cerebellar infarct. 3. Attenuated left distal vertebral artery flow void. See prior MRA for vascular evaluation. 4. Mild to moderate chronic microvascular ischemic disease. Electronically Signed   By: Feliberto Harts MD   On: 01/08/2021 11:51    Scheduled Meds: . (feeding supplement) PROSource Plus  30 mL Oral BID BM  . aspirin  81 mg Oral Daily  . atorvastatin  40 mg Oral Daily  . carvedilol  6.25 mg Oral BID WC  . ciprofloxacin  500 mg Oral BID  . clopidogrel  75 mg Oral Daily  . divalproex  500 mg Oral Daily  . enoxaparin (LOVENOX) injection  40 mg Subcutaneous Q24H  . feeding supplement  237 mL Oral BID BM  . folic acid  1 mg Oral Daily  . isosorbide dinitrate  5 mg Oral TID  . levETIRAcetam  500 mg Oral BID  . magnesium oxide  400 mg Oral BID  . multivitamin with minerals  1 tablet Oral Daily  . oxyCODONE  20 mg Oral  Q12H  . polyethylene glycol  17 g Per Tube Daily  . sacubitril-valsartan  1 tablet Oral BID  . silver sulfADIAZINE   Topical BID  . spironolactone  12.5 mg Oral Daily  . thiamine  100 mg Oral Daily   Continuous Infusions: . lactated ringers Stopped (01/02/21 1552)    Active Problems:   Ischemic stroke (HCC)   Basilar artery embolus   Acute on chronic combined systolic and diastolic CHF (congestive heart failure) (HCC)   Respiratory failure (HCC)   Ulcer  of great toe, left, with necrosis of bone (HCC)   Ulcer of great toe, right, with necrosis of muscle (HCC)   Acute osteomyelitis of toe, left (HCC)   Osteomyelitis of great toe (HCC)   Shock liver   Acute metabolic encephalopathy   CAD (coronary artery disease)   HTN (hypertension)   Bipolar I disorder (HCC)   Cocaine abuse (HCC)   Homelessness   HLD (hyperlipidemia)   Dystonia   Dysarthria   Pain, dental   Consultants:  Neurology  Cardiology  Podiatry  Palliative  Procedures: Bilateral great toe amputation  Antibiotics: Anti-infectives (From admission, onward)   Start     Dose/Rate Route Frequency Ordered Stop   01/05/21 1145  ciprofloxacin (CIPRO) tablet 500 mg        500 mg Oral 2 times daily 01/05/21 1057 01/19/21 0759   01/02/21 1406  vancomycin (VANCOCIN) powder  Status:  Discontinued          As needed 01/02/21 1406 01/02/21 1428   12/17/20 1450  ceFAZolin (ANCEF) 2-4 GM/100ML-% IVPB       Note to Pharmacy: Cristy Friedlander   : cabinet override      12/17/20 1450 12/17/20 1911       Time spent: 20 minutes    Junious Silk ANP  [facility] 7 am - 330 pm/M-F for direct patient care and secure chat Please refer to Amion for contact info 24  days

## 2021-01-10 NOTE — Progress Notes (Signed)
°  Speech Language Pathology Treatment: Dysphagia  Patient Details Name: [name] MRN: [id number] DOB: 09/24/1966 Today's Date: 01/10/2021 Time: 0175-1025 SLP Time Calculation (min) (ACUTE ONLY): 13 min  Assessment / Plan / Recommendation Clinical Impression  SLP followed up for diet check. Lunch meal was delivered at bedside upon SLP arrival. Assisted pt with upright positioning. Pts overall PO intake appears to remain reduced (also noted per chart review). Pt only agreeable to soft solid and regular solids this date. Encouraged thin liquids (variety of options), pt declining. Cognitive deficits post CVA with extensive psychiatric hx suspected to be barrier to improved PO intake. Regular thin liquid diet remains most appropriate diet. No further swallow needs identified. Pt not able to participate in functional cognitive tasks despite encouragement from SLP. Deficits persist however poor progress exhibited. Recommend 24 hour care post DC. SLP to s/o.   HPI HPI: 55 yr old [place] and became unresponsive with left-sided weakness and left gaze deviation. 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 Intubated 1/17-1/20. Pt s/p bilateral big toe amputations.      SLP Plan  Discharge SLP treatment due to (comment) (maximum potential acheived)       Recommendations  Diet recommendations: Regular;Thin liquid Liquids provided via: Cup;Straw Medication Administration: Whole meds with puree Compensations: Minimize environmental distractions;Slow rate;Small sips/bites Postural Changes and/or Swallow Maneuvers: Seated upright 90 degrees;Upright 30-60 min after meal                Oral Care Recommendations: Oral care BID Follow up Recommendations: Skilled Nursing facility SLP  Visit Diagnosis: Dysphagia, unspecified (R13.10) Plan: Discharge SLP treatment due to (comment) (maximum potential acheived)       GO                Ardyth Gal MA, CCC-SLP Acute Rehabilitation Services   01/10/2021, 12:08 PM

## 2021-01-10 NOTE — Plan of Care (Signed)
  Problem: Education: Goal: Knowledge of General Education information will improve Description: Including pain rating scale, medication(s)/side effects and non-pharmacologic comfort measures Outcome: Progressing   Problem: Health Behavior/Discharge Planning: Goal: Ability to manage health-related needs will improve Outcome: Progressing   Problem: Clinical Measurements: Goal: Ability to maintain clinical measurements within normal limits will improve Outcome: Progressing Goal: Will remain free from infection Outcome: Progressing Goal: Diagnostic test results will improve Outcome: Progressing Goal: Respiratory complications will improve Outcome: Progressing Goal: Cardiovascular complication will be avoided Outcome: Progressing   Problem: Activity: Goal: Risk for activity intolerance will decrease Outcome: Progressing   Problem: Nutrition: Goal: Adequate nutrition will be maintained Outcome: Progressing   Problem: Coping: Goal: Level of anxiety will decrease Outcome: Progressing   Problem: Elimination: Goal: Will not experience complications related to bowel motility Outcome: Progressing Goal: Will not experience complications related to urinary retention Outcome: Progressing   Problem: Pain Managment: Goal: General experience of comfort will improve Outcome: Progressing   Problem: Safety: Goal: Ability to remain free from injury will improve Outcome: Progressing   Problem: Skin Integrity: Goal: Risk for impaired skin integrity will decrease Outcome: Progressing   Problem: Activity: Goal: Ability to tolerate increased activity will improve Outcome: Progressing   Problem: Respiratory: Goal: Ability to maintain a clear airway and adequate ventilation will improve Outcome: Progressing   Problem: Role Relationship: Goal: Method of communication will improve Outcome: Progressing   Problem: Education: Goal: Knowledge of disease or condition will  improve Outcome: Progressing Goal: Knowledge of secondary prevention will improve Outcome: Progressing Goal: Knowledge of patient specific risk factors addressed and post discharge goals established will improve Outcome: Progressing Goal: Individualized Educational Video(s) Outcome: Progressing   Problem: Coping: Goal: Will verbalize positive feelings about self Outcome: Progressing Goal: Will identify appropriate support needs Outcome: Progressing   Problem: Health Behavior/Discharge Planning: Goal: Ability to manage health-related needs will improve Outcome: Progressing   Problem: Self-Care: Goal: Ability to participate in self-care as condition permits will improve Outcome: Progressing Goal: Verbalization of feelings and concerns over difficulty with self-care will improve Outcome: Progressing Goal: Ability to communicate needs accurately will improve Outcome: Progressing   Problem: Nutrition: Goal: Risk of aspiration will decrease Outcome: Progressing Goal: Dietary intake will improve Outcome: Progressing   Problem: Education: Goal: Individualized Educational Video(s) Outcome: Progressing   Problem: Ischemic Stroke/TIA Tissue Perfusion: Goal: Complications of ischemic stroke/TIA will be minimized Outcome: Progressing   Problem: Safety: Goal: Non-violent Restraint(s) Outcome: Progressing   Problem: Education: Goal: Knowledge of secondary prevention will improve Outcome: Progressing

## 2021-01-10 NOTE — Consult Note (Addendum)
WOC Nurse Consult Note: Reason for Consult: Reconsult requested to decrease frequency of silver sulfadiazine. Medications reviewed and it is noted that after the [facility] nurse order of silver sulfadiazine, podiatric medicine ordered once daily collagenase/Santyl.  MD orders supercede that from [facility] Nurse, so silver sulfadiazine order is discontinued.  Collagenase is performed once daily.  Wound type: Infectious Pressure Injury POA: N/A  WOC nursing team will not follow, but will remain available to this patient, the nursing and medical teams.  Please re-consult if needed. Thanks, Ladona Mow, MSN, RN, GNP, [facility]  Pager# [phone]

## 2021-01-11 NOTE — Plan of Care (Signed)
°  Problem: Education: Goal: Knowledge of General Education information will improve Description: Including pain rating scale, medication(s)/side effects and non-pharmacologic comfort measures Outcome: Progressing   Problem: Health Behavior/Discharge Planning: Goal: Ability to manage health-related needs will improve Outcome: Progressing   Problem: Clinical Measurements: Goal: Ability to maintain clinical measurements within normal limits will improve Outcome: Progressing Goal: Will remain free from infection Outcome: Progressing Goal: Diagnostic test results will improve Outcome: Progressing Goal: Respiratory complications will improve Outcome: Progressing Goal: Cardiovascular complication will be avoided Outcome: Progressing   Problem: Activity: Goal: Risk for activity intolerance will decrease Outcome: Progressing   Problem: Nutrition: Goal: Adequate nutrition will be maintained Outcome: Progressing   Problem: Coping: Goal: Level of anxiety will decrease Outcome: Progressing   Problem: Elimination: Goal: Will not experience complications related to bowel motility Outcome: Progressing Goal: Will not experience complications related to urinary retention Outcome: Progressing   Problem: Pain Managment: Goal: General experience of comfort will improve Outcome: Progressing   Problem: Safety: Goal: Ability to remain free from injury will improve Outcome: Progressing   Problem: Skin Integrity: Goal: Risk for impaired skin integrity will decrease Outcome: Progressing   Problem: Activity: Goal: Ability to tolerate increased activity will improve Outcome: Progressing   Problem: Respiratory: Goal: Ability to maintain a clear airway and adequate ventilation will improve Outcome: Progressing   Problem: Role Relationship: Goal: Method of communication will improve Outcome: Progressing   Problem: Education: Goal: Knowledge of disease or condition will  improve Outcome: Progressing Goal: Knowledge of secondary prevention will improve Outcome: Progressing Goal: Knowledge of patient specific risk factors addressed and post discharge goals established will improve Outcome: Progressing Goal: Individualized Educational Video(s) Outcome: Progressing   Problem: Coping: Goal: Will verbalize positive feelings about self Outcome: Progressing Goal: Will identify appropriate support needs Outcome: Progressing   Problem: Health Behavior/Discharge Planning: Goal: Ability to manage health-related needs will improve Outcome: Progressing   Problem: Self-Care: Goal: Ability to participate in self-care as condition permits will improve Outcome: Progressing Goal: Verbalization of feelings and concerns over difficulty with self-care will improve Outcome: Progressing Goal: Ability to communicate needs accurately will improve Outcome: Progressing   Problem: Nutrition: Goal: Risk of aspiration will decrease Outcome: Progressing Goal: Dietary intake will improve Outcome: Progressing   Problem: Education: Goal: Individualized Educational Video(s) Outcome: Progressing   Problem: Ischemic Stroke/TIA Tissue Perfusion: Goal: Complications of ischemic stroke/TIA will be minimized Outcome: Progressing   Problem: Safety: Goal: Non-violent Restraint(s) Outcome: Progressing   Problem: Education: Goal: Knowledge of secondary prevention will improve Outcome: Progressing

## 2021-01-11 NOTE — TOC Progression Note (Addendum)
Transition of Care [facility]) - Progression Note    Patient Details  Name: [name] MRN: [id number] Date of Birth: Oct 28, 1966  Transition of Care [facility]) CM/SW Contact  Janae Bridgeman, RN Phone Number: 01/11/2021, 8:23 AM  Clinical Narrative:    Case management called the spoke with Maud Deed, CM of [employer] on the phone and ordered delivery of a lightweight wheelchair and rollator to the hospital room today since the patient may be discharged once he is medically stable for discharge.  Medications will be provided through [facility] pharmacy.  I called [facility] and scheduled a hospital follow up - noted in discharge instructions.  CM and MSW to follow the patient for transition of care needs.   Expected Discharge Plan: Skilled Nursing Facility Barriers to Discharge: Homeless with medical needs,Continued Medical Work up  Expected Discharge Plan and Services Expected Discharge Plan: Skilled Nursing Facility In-house Referral: Clinical Social Work Discharge Planning Services: CM Consult,Indigent Health Clinic,Medication Assistance Post Acute Care Choice: Durable Medical Equipment Living arrangements for the past 2 months: Homeless,No permanent address                 DME Arranged: Walker rolling,Wheelchair manual DME Agency: [employer]     Representative spoke with at DME Agency: Will contact [employer] to order and deliver when closer to discharge to home.             Social Determinants of Health (SDOH) Interventions    Readmission Risk Interventions Readmission Risk Prevention Plan 01/07/2021  Transportation Screening Complete  PCP or Specialist Appt within 3-5 Days Complete  HRI or Home Care Consult Complete  Social Work Consult for Recovery Care Planning/Counseling Complete  Palliative Care Screening Complete  Medication Review [employer]) Complete

## 2021-01-11 NOTE — Care Management (Cosign Needed)
    Durable Medical Equipment  (From admission, onward)         Start     Ordered   01/07/21 1039  For home use only DME Walker rolling  Once       Comments: S/P Amputation of bilateral great toes  Question Answer Comment  Walker: With 5 Inch Wheels   Patient needs a walker to treat with the following condition Amputation of great toe (HCC)      01/07/21 1039   01/07/21 1038  For home use only DME lightweight manual wheelchair with seat cushion  Once       Comments: Patient suffers from S/P bilateral great toe amputation which impairs their ability to perform daily activities like ADLs:20651 in the home.  A walking UEK:80034 will not resolve  issue with performing activities of daily living. A wheelchair will allow patient to safely perform daily activities. Patient is not able to propel themselves in the home using a standard weight wheelchair due to weakness:20653. Patient can self propel in the lightweight wheelchair. Length of need 6 months. Accessories: elevating leg rests (ELRs), wheel locks, extensions and anti-tippers.   01/07/21 1039

## 2021-01-11 NOTE — Progress Notes (Signed)
Physical Therapy Treatment Patient Details Name: [name] MRN: [id number] DOB: [birth year] Today's Date: 01/11/2021    History of Present Illness Homeless 55 yo male found down with c/o CP by bystanders. 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 Extubated 1/20. S/p bil great toe amputations 01/02/21.    PT Comments    Pt more willing to participate in progressing gait today, needing only min-modAx1 to come to stand and ambulate short bedroom distances with a RW. Displayed increased difficulty managing the RW in tight spaces, like a bathroom. Poor cognition into deficits and safety awareness along with his impaired balance and ataxic movements place him at high risk for falls. Will continue to follow acutely. Current recommendations remain appropriate.   Follow Up Recommendations  SNF;Supervision/Assistance - 24 hour     Equipment Recommendations  Wheelchair (measurements PT);Wheelchair cushion (measurements PT);Rolling walker with 5" wheels    Recommendations for Other Services       Precautions / Restrictions Precautions Precautions: Fall Precaution Comments: orthostatic hypotension; pt easily agitated due to psych h/o, is very unsafe; s/p bil great toe amputation 01/02/21 Restrictions Weight Bearing Restrictions: Yes RLE Weight Bearing: Weight bearing as tolerated LLE Weight Bearing: Weight bearing as tolerated Other Position/Activity Restrictions: Post-op shoes present in room    Mobility  Bed Mobility Overal bed mobility: Needs Assistance Bed Mobility: Supine to Sit;Sit to Supine     Supine to sit: Supervision Sit to supine: Supervision   General bed mobility comments: Bed flat, cuing pt to come sit L EOB. Supervision and extra time for all for safety.    Transfers Overall transfer  level: Needs assistance Equipment used: Rolling walker (2 wheeled) Transfers: Sit to/from Stand Sit to Stand: Min assist;Mod assist         General transfer comment: Sit to stan 1x from EOB, 1x from chair, and 1x from commode pulling up on RW or grab bar even after being cued to push up from current sitting surface. Min-modA for steadying and to power up to stand with extra time.  Ambulation/Gait Ambulation/Gait assistance: Min assist;Mod assist Gait Distance (Feet): 20 Feet (x3 bouts of ~15 ft > ~8 ft > ~20 ft) Assistive device: Rolling walker (2 wheeled) Gait Pattern/deviations: Decreased step length - left;Trunk flexed;Step-through pattern;Narrow base of support;Decreased step length - right;Decreased dorsiflexion - right;Decreased dorsiflexion - left;Shuffle;Decreased stride length;Ataxic Gait velocity: reduced Gait velocity interpretation: <1.31 ft/sec, indicative of household ambulator General Gait Details: Pt ambulates with unsteadiness at slow pace, needing min-modA for stability and to keep RW proximal to pt. ModA for tight spaces, like bathroom, as pt has poor awareness and bumps into obstacles and gets stuck without correcting safely. Premature sitting at times. Cues provided to increase feet clearance and step length, mod success but ataxic movements.   Stairs             Wheelchair Mobility    Modified Rankin (Stroke Patients Only) Modified Rankin (Stroke Patients Only) Pre-Morbid Rankin Score: No symptoms Modified Rankin: Moderately severe disability     Balance Overall balance assessment: Needs assistance Sitting-balance support: Feet supported;No upper extremity supported Sitting balance-Leahy Scale: Fair Sitting balance - Comments: Static sitting EOB, supervision for safety.   Standing balance support: Bilateral upper extremity supported;During functional activity Standing balance-Leahy Scale: Poor Standing balance comment: Reliant on UE support on RW and  external assist.                            Cognition Arousal/Alertness: Awake/alert Behavior During Therapy: Impulsive;Agitated Overall Cognitive Status: Impaired/Different from baseline Area of Impairment: Attention;Memory;Following commands;Safety/judgement;Awareness;Problem solving                   Current Attention Level: Selective Memory: Decreased short-term memory;Decreased recall of precautions Following Commands: Follows one step commands with increased time;Follows one step commands inconsistently Safety/Judgement: Decreased awareness of safety;Decreased awareness of deficits Awareness: Emergent Problem Solving: Slow processing;Decreased initiation;Requires verbal cues;Difficulty sequencing;Requires tactile cues General Comments: Pt impulsive and having difficulty maintaining attention to cues, needing them to be repeated often. Cues to sequence tasks and for safety. Pt with anticipatory strategies, needing assistance to manage RW in tight spaces as he will get it stuck often. Pt easily wavers from pleasant to slightly agitated.      Exercises      General Comments        Pertinent Vitals/Pain Pain Assessment: Faces Faces Pain Scale: Hurts even more Pain Location: Feet Pain Descriptors / Indicators: Sore;Operative site guarding;Grimacing Pain Intervention(s): Limited activity within patient's tolerance;Monitored during session;Repositioned    Home Living                      Prior Function            PT Goals (current goals can now be found in the care plan section) Acute Rehab PT Goals Patient Stated Goal: to go to bathroom PT Goal Formulation: With patient Time For Goal Achievement: 01/17/21 Potential to Achieve Goals: Fair Progress towards PT goals: Progressing toward goals    Frequency    Min 3X/week      PT Plan Current plan remains appropriate    Co-evaluation              AM-PAC PT "6 Clicks" Mobility    Outcome Measure  Help needed turning from your back to your side while in a flat bed without using bedrails?: None Help needed moving from lying on your back to sitting on the side of a flat bed without using bedrails?: None Help needed moving to and from a bed to a chair (including a wheelchair)?: A Lot Help needed standing up from a chair using your arms (e.g., wheelchair or bedside chair)?: A Lot Help needed to walk in hospital room?: A Lot Help needed climbing 3-5 steps with a railing? : Total 6 Click Score: 15    End of Session Equipment Utilized During Treatment: Gait belt Activity Tolerance: Patient tolerated treatment well Patient left: in bed;with call bell/phone within reach;with bed alarm set Nurse Communication: Mobility status (notified NT) PT Visit Diagnosis: Unsteadiness on feet (R26.81);Other symptoms and signs involving the nervous system (R29.898);Other abnormalities of gait and mobility (R26.89);Ataxic gait (R26.0);Difficulty in walking, not elsewhere classified (R26.2)     Time: 6333-5456 PT Time Calculation (min) (ACUTE ONLY): 15 min  Charges:  $Gait Training: 8-22 mins                     Raymond Gurney, PT, DPT Acute Rehabilitation Services  Pager: [phone] Office: [phone]    Jewel Baize 01/11/2021, 5:36 PM

## 2021-01-11 NOTE — Progress Notes (Addendum)
Nutrition Follow-up  DOCUMENTATION CODES:   Not applicable  INTERVENTION:   ContinueEnsure Enlive po BID, each supplement provides 350 kcal and 20 grams of protein   Continue27ml Prosource Plus po BID, each supplement provides 100 kcals and 15 grams of protein   Continue MVI with minerals daily  NUTRITION DIAGNOSIS:   Inadequate oral intake related to inability to eat as evidenced by NPO status.  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 PMH of polysubstance abuse, CHF.  1/20 - extubated, diet advanced to regular with thin liquids 1/21 transferred to floor 2/2 - S/p bilateral great toe amputation    Per MD, pt's discharge is difficult due to him being homeless in addition to having significant gait and ambulation issues 2/2 recent bilateral amputation of toes. Pt also noted to have significant substance abuse history and is therefore unlikely to be accepted into nursing homes in the surrounding areas. Additionally, as of today, pt has continued to have suboptimal BP readings requiring adjustments to his cardiac medications.   Pt reports improvement in oral pain/upper palate edema. PO intake has significantly improved with last 8 meal completions documented as 0-100% (74% average meal intake). Per RN, pt doing well with current supplement orders (Ensure BID and Prosource Plus BID). Will continue current nutrition plan of care.  UOP: x24 hours  Admit wt: 95.1 kg Current wt: 95.1 kg  Medications: folvite, mag-ox, mvi, miralax, aldactone, thiamine Labs reviewed.   Diet Order:   Diet Order             Diet 2 gram sodium Room service appropriate? Yes; Fluid consistency: Thin  Diet effective now                 EDUCATION NEEDS:   No education needs have been identified at this time  Skin:  Skin Assessment: Skin Integrity Issues: Skin Integrity Issues:: Incisions Incisions: foot  Last BM:  2/9  Height:   Ht Readings from Last 1 Encounters:  01/02/21 5\' 9"  (1.753 m)    Weight:   Wt Readings from Last 1 Encounters:  01/11/21 95.1 kg   BMI:  Body mass index is 30.96 kg/m.  Estimated Nutritional Needs:   Kcal:  2200-2400  Protein:  120-140 grams  Fluid:  >2 L/day    03/11/21, MS, RD, LDN RD pager number and weekend/on-call pager number located in Amion.

## 2021-01-11 NOTE — Progress Notes (Signed)
[facility] PROGRESS NOTE  [name] WUJ:[id number] DOB: [birth year] DOA: 12/17/2020 PCP: [name]  Status: Remains inpatient appropriate because:Ongoing active pain requiring inpatient pain management, Unsafe d/c plan and IV treatments appropriate due to intensity of illness or inability to take PO   Dispo:  The patient is from: homeless              Anticipated d/c is to: homeless shelter or streets-patient plans on taking a bus from [place] to [place] and living with "friends" after arrival              Anticipated d/c date is: > 3 days              Patient currently is not medically stable to d/c.  Barriers to discharge: Patient is homeless and currently has significant gait and ambulation issues secondary to recent bilateral amputation of toes.  This is further complicated by inadequate pain control.  And although patient has Medicaid he has a significant substance abuse history and therefore is unlikely to be accepted by nursing homes in the area.  As of 2/10 patient continues to have suboptimal blood pressure readings requiring adjustments in his cardiac medications.   Difficult to place patient Yes   Level of care: Med-Surg  Code Status: Full Family Communication: Patient only DVT prophylaxis: Lovenox Vaccination status: Does not appear to be vaccinated against Covid  Foley catheter: No  HPI: 55 year old male with past medical history of coronary artery disease, chronic systolic CHF, CVA scented with chest pain and became unresponsive with left-sided weakness and left gaze deviation.  Was intubated in the ER.  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  Extubated on 1/20 and transferred to [facility] service on 1/21.  Subjective: Patient awake.  Asking appropriate questions regarding discharge equipment and anticipated discharge disposition -confirms he has to  funding for transportation, food and medications.  His old tennis shoes are too small and he is asking for replacement.  Verbalizes understanding that some of his difficulty speaking and difficulty moving his right arm are secondary to his recent stroke involving the cerebellar region.  Objective: Vitals:   01/11/21 0352 01/11/21 0747  BP: 115/82 (!) 83/70  Pulse: 85 93  Resp: 18 18  Temp: 98.1 F (36.7 C) 97.9 F (36.6 C)  SpO2: 99% 92%    Intake/Output Summary (Last 24 hours) at 01/11/2021 9562 Last data filed at 01/11/2021 0750 Gross per 24 hour  Intake --  Output 500 ml  Net -500 ml   Filed Weights   01/09/21 0333 01/10/21 0301 01/11/21 0352  Weight: 93.8 kg 95.3 kg 95.1 kg    Exam:  Constitutional: Alert, appropriate, in no acute distress ENT: Upper palate edema resolved Respiratory: Lungs are clear and stable on room air Cardiovascular: Pulse is regular, SBP still in the low 100s, extremities warm to touch Abdomen: Soft and nontender.  Tolerating diet and as of today is eating and drinking more than he has in the past several days.  LBM 2/9 Skin: Bilateral toe amputation site covered with dressings-clean dry and intact without any bleeding. Neurologic: CN 2-12 grossly intact. Sensation intact, Strength 4-5/5 x all 4 extremities.  Continues to have dystonic movements and some dysarthria but markedly improved as compared 2/9 and suspect this is more related to cerebellar injury from recent stroke. Psychiatric: Awake and alert.  For the most part appears to be oriented x3.  Affect appropriate.   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 -A1c: 6.4%, LDL: 72, echo shows ejection fraction less than 20% with grade 2 diastolic dysfunction -Neurology signed off -PT/OT recommended SNF but patient prefers to discharge with friends -According to PT note from 2/7 patient remains impulsive with no awareness of safety/deficits.  He is a high fall risk.  Stating he wants to go home and is again refusing SNF.  Recommend rolling walker and wheelchair if he declines SNF.This has already been requested -Repeat MRI on 2/8 as below-no acute infarct-suspect dysarthria and dystonia secondary to side effects from Lyrica as described below  Acute dysarthria with dystonia -MRI negative for acute changes -Symptoms resolved after discontinuation of Lyrica -Has mild residual dystonia likely related to recent cerebellar stroke  Bilateral great toe ulcers/osteomyelitis noted in left/status post multiple toe amputations: -S/p bilateral great toe amputation on 2/2 via podiatry -2/4 resumed DVT prophylaxis with Lovenox - continue OxyContin 20 mg every 12 hours, decrease OxyIR to 5 mg every 4 hours as needed for breakthrough pain, continue IV Dilaudid for severe pain.  **TOC benefit eligibility has determined that patient has Medicaid of [place]- OxyContin has a $4 co-pay for each prescription -Continue Cipro for a total of 2 weeks (wound culture positive for Proteus vulgaris resistant to ampicillin and intermediate to Unasyn resistant to cefazolin and intermediate to imipenem.  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  Hydralazine was also discontinued.  Continue Entresto; continue Aldactone -2/10 given ongoing suboptimal blood pressure readings Lasix discontinued -Strict I/O's and daily weight -Follow-up with cardiology outpatient-appreciate cardiology's recommendation  Suspected Seizures: -EEG from 1/17 w/ diffuse encephalopathy, nonspecific etiology.  EEG from 1/19 showed evidence of epileptogenicity arising from right more than left frontal region. -EEG on 1/26 shows no seizures or epileptiform discharges were seen throughout the recording. -Continue Keppra 500 mg twice daily as per neurology's recommendations  Coronary artery disease: -Cardiology recommended outpatient cath at least 1 month after recovery from stroke. -Continue aspirin and atorvastatin  Physical deconditioning/gait instability -Last PT evaluation was on 2/3 prior to adjustments in pain medications.  Patient was having significant difficulty with mobility secondary to pain -He was also reporting dizziness over the weekend with standing but would not allow vital signs to be checked.  We will attempt to obtain orthostatic vital signs. -We will order rolling walker and wheelchair noting patient is at high risk for falls although he is making progress needing only minimal assist to come to stand with rolling walker and minimal to mod assist x2 to stand step to the right with a rolling walker    Other problems: Acute metabolic/toxic encephalopathy: -Multifactorial in the setting of stroke, drug use, respiratory failure  and sedation -Resolved -Continue folic acid, thiamine and multivitamin -Seen by psychiatry-appreciate help  Hypertension:  Blood pressure 140s/100s-likely secondary to pain with significant improvement in hypertension with improved pain control as above. -Monitor blood  pressure closely  Acute transaminitis -LFTs as of 2/3 are within normal limits except for low albumin of 3.1 -Elevated LFTs likely related to shock liver phases of admission the mild elevation of total bilirubin to 1.7 therefore not consistent with acute liver failure  Oral pain w/ upper palate edema -Orthopantogram unremarkable -As of 2/10 is resolved  Cocaine use:  Counseled about cessation  Hyponatremia:  Resolved  Bipolar disorder/homelessness: -Poor compliance.  Homelessness.   Consulted psychiatry-evaluated and cleared him a danger to himself or to others. -Continue Depakote  Hyperlipidemia:  Continue statin.    Data Reviewed: Basic Metabolic Panel: Recent Labs  Lab 01/06/21 0208 01/06/21 1113 01/07/21 0222  NA 134* 135  --   K 3.9 3.9  --   CL 100 99  --   CO2 23 24  --   GLUCOSE 154* 148*  --   BUN 12 7  --   CREATININE 1.03 0.89  --   CALCIUM 8.6* 8.9  --   MG 1.8  --  2.1   Liver Function Tests: No results for input(s): AST, ALT, ALKPHOS, BILITOT, PROT, ALBUMIN in the last 168 hours. No results for input(s): LIPASE, AMYLASE in the last 168 hours. No results for input(s): AMMONIA in the last 168 hours. CBC: Recent Labs  Lab 01/06/21 0208 01/08/21 1002  WBC 8.9 7.2  NEUTROABS  --  3.4  HGB 12.5* 13.9  HCT 38.6* 41.3  MCV 94.8 92.0  PLT 231 288   Cardiac Enzymes: No results for input(s): CKTOTAL, CKMB, CKMBINDEX, TROPONINI in the last 168 hours. BNP (last 3 results) No results for input(s): BNP in the last 8760 hours.  ProBNP (last 3 results) No results for input(s): PROBNP in the last 8760 hours.  CBG: Recent Labs  Lab 01/06/21 1245 01/06/21 1542 01/06/21 2021 01/06/21 2351 01/07/21 0800  GLUCAP 116* 102* 158* 171* 158*    Recent Results (from the past 240 hour(s))  Surgical pcr screen     Status: None   Collection Time: 01/01/21  8:08 PM   Specimen: Nasal Mucosa; Nasal Swab  Result Value Ref Range Status   MRSA, PCR  NEGATIVE NEGATIVE Final   Staphylococcus aureus NEGATIVE NEGATIVE Final    Comment: (NOTE) The Xpert SA Assay (FDA approved for NASAL specimens in patients 52 years of age and older), is one component of a comprehensive surveillance program. It is not intended to diagnose infection nor to guide or monitor treatment. Performed at [facility], [address] [place], [name] [address]   Aerobic/Anaerobic Culture (surgical/deep wound)     Status: None   Collection Time: 01/02/21  1:52 PM   Specimen: Wound  Result Value Ref Range Status   Specimen Description WOUND  Final   Special Requests LEFT TOE SPEC A  Final   Gram Stain   Final    NO WBC SEEN FEW GRAM POSITIVE COCCI RARE GRAM NEGATIVE RODS Performed at [facility], [address], [place], [name] [address]    Culture   Final    FEW PROTEUS VULGARIS MIXED ANAEROBIC FLORA PRESENT.  CALL LAB IF FURTHER IID REQUIRED.    Report Status 01/07/2021 FINAL  Final   Organism ID, Bacteria PROTEUS VULGARIS  Final      Susceptibility  Proteus vulgaris - MIC*    AMPICILLIN >=32 RESISTANT Resistant     CEFAZOLIN >=64 RESISTANT Resistant     CEFEPIME <=0.12 SENSITIVE Sensitive     CEFTAZIDIME <=1 SENSITIVE Sensitive     CIPROFLOXACIN <=0.25 SENSITIVE Sensitive     GENTAMICIN <=1 SENSITIVE Sensitive     IMIPENEM 8 INTERMEDIATE Intermediate     TRIMETH/SULFA <=20 SENSITIVE Sensitive     AMPICILLIN/SULBACTAM 16 INTERMEDIATE Intermediate     PIP/TAZO <=4 SENSITIVE Sensitive     * FEW PROTEUS VULGARIS     Studies: No results found.  Scheduled Meds: . (feeding supplement) PROSource Plus  30 mL Oral BID BM  . aspirin  81 mg Oral Daily  . atorvastatin  40 mg Oral Daily  . carvedilol  6.25 mg Oral BID WC  . ciprofloxacin  500 mg Oral BID  . clopidogrel  75 mg Oral Daily  . divalproex  500 mg Oral Daily  . enoxaparin (LOVENOX) injection  40 mg Subcutaneous Q24H  . feeding supplement  237 mL Oral BID BM  . folic acid  1  mg Oral Daily  . isosorbide dinitrate  5 mg Oral TID  . levETIRAcetam  500 mg Oral BID  . magnesium oxide  400 mg Oral BID  . multivitamin with minerals  1 tablet Oral Daily  . oxyCODONE  20 mg Oral Q12H  . polyethylene glycol  17 g Per Tube Daily  . sacubitril-valsartan  1 tablet Oral BID  . silver sulfADIAZINE   Topical BID  . spironolactone  12.5 mg Oral Daily  . thiamine  100 mg Oral Daily   Continuous Infusions: . lactated ringers Stopped (01/02/21 1552)    Active Problems:   Ischemic stroke (HCC)   Basilar artery embolus   Acute on chronic combined systolic and diastolic CHF (congestive heart failure) (HCC)   Respiratory failure (HCC)   Ulcer of great toe, left, with necrosis of bone (HCC)   Ulcer of great toe, right, with necrosis of muscle (HCC)   Acute osteomyelitis of toe, left (HCC)   Osteomyelitis of great toe (HCC)   Shock liver   Acute metabolic encephalopathy   CAD (coronary artery disease)   HTN (hypertension)   Bipolar I disorder (HCC)   Cocaine abuse (HCC)   Homelessness   HLD (hyperlipidemia)   Dystonia   Dysarthria   Pain, dental   Consultants:  Neurology  Cardiology  Podiatry  Palliative  Procedures: Bilateral great toe amputation  Antibiotics: Anti-infectives (From admission, onward)   Start     Dose/Rate Route Frequency Ordered Stop   01/05/21 1145  ciprofloxacin (CIPRO) tablet 500 mg        500 mg Oral 2 times daily 01/05/21 1057 01/19/21 0759   01/02/21 1406  vancomycin (VANCOCIN) powder  Status:  Discontinued          As needed 01/02/21 1406 01/02/21 1428   12/17/20 1450  ceFAZolin (ANCEF) 2-4 GM/100ML-% IVPB       Note to Pharmacy: Cristy Friedlander   : cabinet override      12/17/20 1450 12/17/20 1911       Time spent: 20 minutes    Junious Silk ANP  [facility] 7 am - 330 pm/M-F for direct patient care and secure chat Please refer to Amion for contact info 25  days

## 2021-01-11 NOTE — Progress Notes (Signed)
Patients BP 83/70, MD notified, will recheck in 30 min to 1 hr. Holding morning meds per MD

## 2021-01-12 DIAGNOSIS — I10 Essential (primary) hypertension: Secondary | ICD-10-CM

## 2021-01-12 DIAGNOSIS — I2584 Coronary atherosclerosis due to calcified coronary lesion: Secondary | ICD-10-CM

## 2021-01-12 NOTE — Progress Notes (Signed)
PROGRESS NOTE    [name]  FYT:[id number] DOB: [birth year] DOA: 12/17/2020 PCP: [name]    Chief Complaint  Patient presents with  . Code Stroke    Brief Narrative:   55 year old male with past medical history of coronary artery disease, chronic systolic CHF, CVA scented with chest pain and became unresponsive with left-sided weakness and left gaze deviation. Was intubated in the ER. 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 Extubated on 1/20 and transferred to [facility] service on 1/21.   Assessment & Plan:   Active Problems:   Ischemic stroke (HCC)   Basilar artery embolus   Acute on chronic combined systolic and diastolic CHF (congestive heart failure) (HCC)   Respiratory failure (HCC)   Ulcer of great toe, left, with necrosis of bone (HCC)   Ulcer of great toe, right, with necrosis of muscle (HCC)   Acute osteomyelitis of toe, left (HCC)   Osteomyelitis of great toe (HCC)   Shock liver   Acute metabolic encephalopathy   CAD (coronary artery disease)   HTN (hypertension)   Bipolar I disorder (HCC)   Cocaine abuse (HCC)   Homelessness   HLD (hyperlipidemia)   Dystonia   Dysarthria   Pain, dental  Acute ischemic stroke/right superior and middle cerebellar peduncle Continue with aspirin and Plavix for 3 weeks then aspirin alone as per neurology recommendations.  Last A1c 6.4, LDL is 72. Echocardiogram showed left ventricular ejection fraction of 20% with grade 2 diastolic dysfunction. Therapy evaluations recommending SNF but patient prefers to be discharged home. He does not show any acute infarct.     acute dysarthria with dystonia Probably secondary to Lyrica.    Bilateral great toe ulcer/osteomyelitis noted S/p toe amputations. Pain control and complete the course of antibiotics.    Chronic combined systolic and diastolic heart  failure. Patient appears to be compensated at this time, continue with Entresto and Aldactone.   Suspected seizures Continue with Keppra as per neurology recommendation    Coronary artery disease Patient denies any chest pain Continue with aspirin and statin.    Acute metabolic/toxic encephalopathy Appears to have improved.   Essential hypertension Blood pressure has improved this morning, resume home medications.   DVT prophylaxis: (Lovenox) Code Status: (Full code) Family Communication: none at bedside.  Disposition:   Status is: Inpatient  Remains inpatient appropriate because:Unsafe d/c plan   Dispo: The patient is from: Home              Anticipated d/c is to: SNF              Anticipated d/c date is: 2 days              Patient currently is not medically stable to d/c.   Difficult to place patient Yes       Consultants:   Neurology.  Procedures:  Antimicrobials: none.    Subjective: Reports not feeling good, no chest pain or sob. No nausea or vomiting.   Objective: Vitals:   01/11/21 1605 01/11/21 2056 01/12/21 0500 01/12/21 0727  BP: 116/81 (!) 139/98  (!) 161/108  Pulse: 78 78  86  Resp: 16 18  18   Temp: 97.7 F (36.5 C) 98.2 F (36.8 C)  (!) 97.3 F (36.3 C)  TempSrc: Oral Oral  Oral  SpO2: 99% 100%  100%  Weight:   88.3 kg   Height:  No intake or output data in the 24 hours ending 01/12/21 1610 Filed Weights   01/10/21 0301 01/11/21 0352 01/12/21 0500  Weight: 95.3 kg 95.1 kg 88.3 kg    Examination:  General exam: Appears calm and comfortable  Respiratory system: Clear to auscultation. Respiratory effort normal. Cardiovascular system: S1 & S2 heard, RRR. No JVD No pedal edema. Gastrointestinal system: Abdomen is soft, NT BS+ Central nervous system: Alert and comfortable, able to answer simple questions.  Extremities: no pedal edema.  Skin: No rashes, Psychiatry: Mood & affect appropriate.     Data Reviewed: I have  personally reviewed following labs and imaging studies  CBC: Recent Labs  Lab 01/06/21 0208 01/08/21 1002  WBC 8.9 7.2  NEUTROABS  --  3.4  HGB 12.5* 13.9  HCT 38.6* 41.3  MCV 94.8 92.0  PLT 231 288    Basic Metabolic Panel: Recent Labs  Lab 01/06/21 0208 01/06/21 1113 01/07/21 0222  NA 134* 135  --   K 3.9 3.9  --   CL 100 99  --   CO2 23 24  --   GLUCOSE 154* 148*  --   BUN 12 7  --   CREATININE 1.03 0.89  --   CALCIUM 8.6* 8.9  --   MG 1.8  --  2.1    GFR: Estimated Creatinine Clearance: 106.7 mL/min (by C-G formula based on SCr of 0.89 mg/dL).  Liver Function Tests: No results for input(s): AST, ALT, ALKPHOS, BILITOT, PROT, ALBUMIN in the last 168 hours.  CBG: Recent Labs  Lab 01/06/21 1245 01/06/21 1542 01/06/21 2021 01/06/21 2351 01/07/21 0800  GLUCAP 116* 102* 158* 171* 158*     No results found for this or any previous visit (from the past 240 hour(s)).       Radiology Studies: No results found.      Scheduled Meds: . (feeding supplement) PROSource Plus  30 mL Oral BID BM  . aspirin  81 mg Oral Daily  . atorvastatin  40 mg Oral Daily  . carvedilol  6.25 mg Oral BID WC  . ciprofloxacin  500 mg Oral BID  . divalproex  500 mg Oral Daily  . enoxaparin (LOVENOX) injection  40 mg Subcutaneous Q24H  . feeding supplement  237 mL Oral BID BM  . folic acid  1 mg Oral Daily  . isosorbide dinitrate  5 mg Oral TID  . levETIRAcetam  500 mg Oral BID  . magnesium oxide  400 mg Oral BID  . multivitamin with minerals  1 tablet Oral Daily  . oxyCODONE  20 mg Oral Q12H  . polyethylene glycol  17 g Per Tube Daily  . sacubitril-valsartan  1 tablet Oral BID  . silver sulfADIAZINE   Topical BID  . spironolactone  12.5 mg Oral Daily  . thiamine  100 mg Oral Daily   Continuous Infusions: . lactated ringers Stopped (01/02/21 1552)     LOS: 26 days        Kathlen Mody, MD [facility]   To contact the attending provider between  7A-7P or the covering provider during after hours 7P-7A, please log into the web site [email or web address] and access using universal [place] password for that web site. If you do not have the password, please call the hospital operator.  01/12/2021, 4:10 PM

## 2021-01-12 NOTE — Plan of Care (Signed)
  Problem: Education: Goal: Knowledge of General Education information will improve Description: Including pain rating scale, medication(s)/side effects and non-pharmacologic comfort measures Outcome: Progressing   Problem: Health Behavior/Discharge Planning: Goal: Ability to manage health-related needs will improve Outcome: Progressing   Problem: Clinical Measurements: Goal: Ability to maintain clinical measurements within normal limits will improve Outcome: Progressing Goal: Will remain free from infection Outcome: Progressing Goal: Diagnostic test results will improve Outcome: Progressing Goal: Respiratory complications will improve Outcome: Progressing Goal: Cardiovascular complication will be avoided Outcome: Progressing   Problem: Nutrition: Goal: Adequate nutrition will be maintained Outcome: Progressing   Problem: Activity: Goal: Risk for activity intolerance will decrease Outcome: Progressing   Problem: Coping: Goal: Level of anxiety will decrease Outcome: Progressing   Problem: Elimination: Goal: Will not experience complications related to bowel motility Outcome: Progressing Goal: Will not experience complications related to urinary retention Outcome: Progressing   

## 2021-01-12 NOTE — Progress Notes (Signed)
Wound care done on both feet bilaterally, per orders, pt tolerated well.

## 2021-01-13 NOTE — Plan of Care (Signed)
Not progressing at this time due to intermittent refusal to cooperate with care and careplans with treatment team(s).   Problem: Education: Goal: Knowledge of General Education information will improve Description: Including pain rating scale, medication(s)/side effects and non-pharmacologic comfort measures Outcome: Not Progressing   Problem: Health Behavior/Discharge Planning: Goal: Ability to manage health-related needs will improve Outcome: Not Progressing   Problem: Clinical Measurements: Goal: Ability to maintain clinical measurements within normal limits will improve Outcome: Not Progressing Goal: Will remain free from infection Outcome: Not Progressing Goal: Diagnostic test results will improve Outcome: Not Progressing Goal: Respiratory complications will improve Outcome: Not Progressing Goal: Cardiovascular complication will be avoided Outcome: Not Progressing   Problem: Activity: Goal: Risk for activity intolerance will decrease Outcome: Not Progressing   Problem: Nutrition: Goal: Adequate nutrition will be maintained Outcome: Not Progressing   Problem: Coping: Goal: Level of anxiety will decrease Outcome: Not Progressing   Problem: Coping: Goal: Level of anxiety will decrease Outcome: Not Progressing   Problem: Elimination: Goal: Will not experience complications related to bowel motility Outcome: Not Progressing Goal: Will not experience complications related to urinary retention Outcome: Not Progressing

## 2021-01-13 NOTE — Progress Notes (Signed)
PROGRESS NOTE    [name]  OJJ:[id number] DOB: [birth year] DOA: 12/17/2020 PCP: [name]    Chief Complaint  Patient presents with  . Code Stroke    Brief Narrative:   55 year old male with past medical history of coronary artery disease, chronic systolic CHF, CVA scented with chest pain and became unresponsive with left-sided weakness and left gaze deviation. Was intubated in the ER. 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 Extubated on 1/20 and transferred to [facility] service on 1/21.  Pt seen and examined, he appears confused, but oriented to person.   Assessment & Plan:   Active Problems:   Ischemic stroke (HCC)   Basilar artery embolus   Acute on chronic combined systolic and diastolic CHF (congestive heart failure) (HCC)   Respiratory failure (HCC)   Ulcer of great toe, left, with necrosis of bone (HCC)   Ulcer of great toe, right, with necrosis of muscle (HCC)   Acute osteomyelitis of toe, left (HCC)   Osteomyelitis of great toe (HCC)   Shock liver   Acute metabolic encephalopathy   CAD (coronary artery disease)   HTN (hypertension)   Bipolar I disorder (HCC)   Cocaine abuse (HCC)   Homelessness   HLD (hyperlipidemia)   Dystonia   Dysarthria   Pain, dental  Acute ischemic stroke/right superior and middle cerebellar peduncle Continue with aspirin and Plavix for 3 weeks then aspirin alone as per neurology recommendations.  Last A1c 6.4, LDL is 72. Echocardiogram showed left ventricular ejection fraction of 20% with grade 2 diastolic dysfunction. Therapy evaluations recommending SNF but patient prefers to be discharged home. Repeat MRI of the brain does not show any new infarct.     Acute dysarthria with dystonia Probably secondary to Lyrica.    Bilateral great toe ulcer/osteomyelitis noted S/p toe amputations. Pain control and complete  the course of antibiotics.    Chronic combined systolic and diastolic heart failure. Patient appears to be compensated at this time, continue with Entresto and Aldactone. Continue with strict intake and output.    Suspected seizures Continue with Keppra and depakote as per neurology recommendation    Coronary artery disease Patient denies any chest pain Continue with aspirin and statin.    Acute metabolic/toxic encephalopathy Appears to have improved, but continues    Essential hypertension Blood pressure has improved this morning, resume home medications.   DVT prophylaxis: (Lovenox) Code Status: (Full code) Family Communication: none at bedside.  Disposition:   Status is: Inpatient  Remains inpatient appropriate because:Unsafe d/c plan   Dispo: The patient is from: Home              Anticipated d/c is to: SNF              Anticipated d/c date is: 2 days              Patient currently is not medically stable to d/c.   Difficult to place patient Yes       Consultants:   Neurology.  Procedures:  Antimicrobials: none.    Subjective: Pt reports not feeling good.  Wont tell me what is wrong, he said he feels cold and went to sleep.   Objective: Vitals:   01/12/21 1924 01/13/21 0309 01/13/21 0317 01/13/21 0845  BP: 116/75 (!) 150/61  (!) 162/90  Pulse: 75 72  87  Resp: 18 20  20   Temp: 98 F (36.7 C)  97.6 F (36.4 C)  97.6 F (36.4 C)  TempSrc:  Axillary  Oral  SpO2: 99% 100%  100%  Weight:   78.7 kg   Height:        Intake/Output Summary (Last 24 hours) at 01/13/2021 1458 Last data filed at 01/13/2021 1300 Gross per 24 hour  Intake 480 ml  Output 575 ml  Net -95 ml   Filed Weights   01/11/21 0352 01/12/21 0500 01/13/21 0317  Weight: 95.1 kg 88.3 kg 78.7 kg    Examination:  General exam: Appears calm and comfortable  Respiratory system: Air entry fair.  Cardiovascular system: S1 & S2 heard,  Gastrointestinal system: Abdomen is soft  , non tender non distended. Bowel sounds good.  Central nervous system: alert, but appears confused.  Extremities: no pedal edema.  Skin: No rashes, bandages around the feet.  Psychiatry: Mood is appropriate.     Data Reviewed: I have personally reviewed following labs and imaging studies  CBC: Recent Labs  Lab 01/08/21 1002  WBC 7.2  NEUTROABS 3.4  HGB 13.9  HCT 41.3  MCV 92.0  PLT 288    Basic Metabolic Panel: Recent Labs  Lab 01/07/21 0222  MG 2.1    GFR: Estimated Creatinine Clearance: 97.1 mL/min (by C-G formula based on SCr of 0.89 mg/dL).  Liver Function Tests: No results for input(s): AST, ALT, ALKPHOS, BILITOT, PROT, ALBUMIN in the last 168 hours.  CBG: Recent Labs  Lab 01/06/21 1542 01/06/21 2021 01/06/21 2351 01/07/21 0800  GLUCAP 102* 158* 171* 158*     No results found for this or any previous visit (from the past 240 hour(s)).       Radiology Studies: No results found.      Scheduled Meds: . (feeding supplement) PROSource Plus  30 mL Oral BID BM  . aspirin  81 mg Oral Daily  . atorvastatin  40 mg Oral Daily  . carvedilol  6.25 mg Oral BID WC  . ciprofloxacin  500 mg Oral BID  . divalproex  500 mg Oral Daily  . enoxaparin (LOVENOX) injection  40 mg Subcutaneous Q24H  . feeding supplement  237 mL Oral BID BM  . folic acid  1 mg Oral Daily  . isosorbide dinitrate  5 mg Oral TID  . levETIRAcetam  500 mg Oral BID  . magnesium oxide  400 mg Oral BID  . multivitamin with minerals  1 tablet Oral Daily  . oxyCODONE  20 mg Oral Q12H  . polyethylene glycol  17 g Per Tube Daily  . sacubitril-valsartan  1 tablet Oral BID  . silver sulfADIAZINE   Topical BID  . spironolactone  12.5 mg Oral Daily  . thiamine  100 mg Oral Daily   Continuous Infusions: . lactated ringers Stopped (01/02/21 1552)     LOS: 27 days        Kathlen Mody, MD [facility]   To contact the attending provider between 7A-7P or the covering  provider during after hours 7P-7A, please log into the web site [email or web address] and access using universal [place] password for that web site. If you do not have the password, please call the hospital operator.  01/13/2021, 2:58 PM

## 2021-01-13 NOTE — Progress Notes (Signed)
Patient refused bilateral wound care and dressing change for foot wounds.

## 2021-01-13 NOTE — Progress Notes (Signed)
Patient refusing a full assessment, unable to complete heart, lung and bowel sound assessment this morning.

## 2021-01-13 NOTE — Progress Notes (Signed)
Patient refusing vital signs

## 2021-01-14 MED ORDER — SACUBITRIL-VALSARTAN 24-26 MG PO TABS: 1.0000 | ORAL_TABLET | Freq: Two times a day (BID) | ORAL | 6 refills | Status: DC

## 2021-01-14 MED ORDER — CIPROFLOXACIN HCL 500 MG PO TABS: 500.0000 mg | ORAL_TABLET | Freq: Two times a day (BID) | ORAL | 0 refills | Status: DC

## 2021-01-14 MED ORDER — SPIRONOLACTONE 25 MG PO TABS: 12.5000 mg | ORAL_TABLET | Freq: Every day | ORAL | 6 refills | Status: DC

## 2021-01-14 MED ORDER — ASPIRIN 81 MG PO CHEW: 81.0000 mg | CHEWABLE_TABLET | Freq: Every day | ORAL | 6 refills | Status: DC

## 2021-01-14 MED ORDER — ISOSORBIDE DINITRATE 5 MG PO TABS: 5.0000 mg | ORAL_TABLET | Freq: Three times a day (TID) | ORAL | 6 refills | Status: DC

## 2021-01-14 MED ORDER — ACETAMINOPHEN 325 MG PO TABS: 650.0000 mg | ORAL_TABLET | ORAL | Status: DC | PRN

## 2021-01-14 MED ORDER — MAGNESIUM OXIDE 400 (241.3 MG) MG PO TABS: 400.0000 mg | ORAL_TABLET | Freq: Two times a day (BID) | ORAL | 6 refills | Status: DC

## 2021-01-14 MED ORDER — OXYCODONE HCL ER 20 MG PO T12A: 20.0000 mg | EXTENDED_RELEASE_TABLET | Freq: Two times a day (BID) | ORAL | 0 refills | Status: DC

## 2021-01-14 MED ORDER — LEVETIRACETAM 500 MG PO TABS: 500.0000 mg | ORAL_TABLET | Freq: Two times a day (BID) | ORAL | 6 refills | Status: DC

## 2021-01-14 MED ORDER — OXYCODONE HCL 5 MG PO TABS: 5.0000 mg | ORAL_TABLET | ORAL | 0 refills | Status: DC | PRN

## 2021-01-14 NOTE — Progress Notes (Addendum)
CSW and NP spoke with patient at bedside - patient states he will contact his friend regarding his tentative discharge tomorrow. Patient reports he will be access to necessities for self care, including food, running water, and a heat source. Patient will receive his medications from [facility] pharmacy prior to discharge. An appointment has been made for PCP follow up.   CSW spoke with Ian Malkin of [employer] who confirmed the equipment was delivered to the patient's room.  Edwin Dada, MSW, [name] Transitions of Care   Clinical Social Worker II [phone]

## 2021-01-14 NOTE — Plan of Care (Signed)
  Problem: Self-Care: Goal: Ability to communicate needs accurately will improve Outcome: Progressing   Problem: Nutrition: Goal: Risk of aspiration will decrease Outcome: Progressing Goal: Dietary intake will improve Outcome: Progressing   Problem: Education: Goal: Individualized Educational Video(s) Outcome: Progressing   Problem: Ischemic Stroke/TIA Tissue Perfusion: Goal: Complications of ischemic stroke/TIA will be minimized Outcome: Progressing   Problem: Education: Goal: Knowledge of secondary prevention will improve Outcome: Progressing

## 2021-01-14 NOTE — Progress Notes (Addendum)
[facility] PROGRESS NOTE  [name] JXB:[id number] DOB: 1966/08/06 DOA: 12/17/2020 [facility]CP: [name]  Status: Remains inpatient appropriate because:Ongoing active pain requiring inpatient pain management, Unsafe d/c plan and IV treatments appropriate due to intensity of illness or inability to take PO   Dispo:  The patient is from: homeless              Anticipated d/c is to: homeless shelter or streets-patient plans on taking a bus from [place] to [place] and living with "friends" after arrival              Anticipated d/c date is: > 3 days              Patient currently is not medically stable to d/c.  Barriers to discharge: Patient is homeless and currently has significant gait and ambulation issues secondary to recent bilateral amputation of toes.  This is further complicated by inadequate pain control.  And although patient has Medicaid he has a significant substance abuse history and therefore is unlikely to be accepted by nursing homes in the area.  As of 2/10 patient continues to have suboptimal blood pressure readings requiring adjustments in his cardiac medications.  Also he has been reevaluated by psychiatry and given his lack of insight into his current condition and inability to provide a safe discharge plan it is recommended patient stay in the hospital while he continues to recover.  Patient is also now reporting he prefers to stay in hospital longer as well given his difficulty performing basic tasks.  He continues to have issues with expressive aphasia and dysarthria therefore I have asked for speech therapy to come and reevaluate the patient   Difficult to place patient Yes   Level of care: Med-Surg  Code Status: Full Family Communication: Patient only DVT prophylaxis: Lovenox Vaccination status: Does not appear to be vaccinated against Covid  Foley catheter: No  HPI: 55 year old male with past medical history of coronary artery disease, chronic  systolic CHF, CVA presented with chest pain and became unresponsive with left-sided weakness and left gaze deviation.  Was intubated in the ER.  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  Extubated on 1/20 and transferred to [facility] service on 1/21.  Subjective: Awake and alert.  Still has dysarthria at times making it difficult to communicate his wants and needs aware that we will likely discharge home on 2/15.  We have obtained a Rollator walker.  We will attempt to obtain him appropriate shoes to wear given his recent amputations.  He continues to state that he will be staying with friends after discharge.  He has been given the task to find out the actual address of these friends to ensure best possible discharge plan  Objective: Vitals:   01/13/21 0845 01/13/21 2112  BP: (!) 162/90 101/75  Pulse: 87 80  Resp: 20 18  SpO2: 100% 96%    Intake/Output Summary (Last 24 hours) at 01/14/2021 0743 Last data filed at 01/13/2021 1500 Gross per 24 hour  Intake 480 ml  Output 400 ml  Net 80 ml   Filed Weights   01/11/21 0352 01/12/21 0500 01/13/21 0317  Weight: 95.1 kg 88.3 kg 78.7 kg    Exam:  Constitutional: Awake and alert and at baseline mentation Respiratory: Lungs are clear and he is stable on room air Cardiovascular: Normal heart sounds, no peripheral edema, pulses regular and nontachycardic Abdomen: Soft  and nontender with normal active bowel sounds.  Eating well.  LBM 2/14 Skin: Bilateral toe amputation clean dry and intact noting staples intact without any dehiscence of wound or purulent drainage. Neurologic: CN 2-12 grossly intact. Sensation intact, Strength 4-5/5 x all 4 extremities.  Continues to have dystonic movements and some dysarthria but markedly improved as compared 2/9 and suspect this is more related to cerebellar injury from recent stroke. Psychiatric: Awake, alert and  oriented x3 although speech is difficult to understand given his significant post stroke dysarthria.  Pleasant mood and affect.   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 -A1c: 6.4%, LDL: 72, echo shows ejection fraction less than 20% with grade 2 diastolic dysfunction -Neurology signed off -PT/OT recommended SNF but patient prefers to discharge with friends -According to PT note from 2/7 patient remains impulsive with no awareness of safety/deficits.  He is a high fall risk.  Stating he wants to go home and is again refusing SNF.  Recommend rolling walker and wheelchair if he declines SNF.This has already been requested -Repeat MRI on 2/8 as below-no acute infarct-suspect dysarthria and dystonia secondary to side effects from Lyrica as described below  Bipolar disorder/homelessness: -Poor compliance.  Homelessness.   -Consulted psychiatry early during the hospitalization-evaluated and cleared him a danger to himself or to others.  Patient on 2/1 the patient did meet criteria for decision making to go home if desired to under AMA status -Patient appears to be stable on current psychotropic medication regimen.  We will ask psych to come back but today on 2/14 to evaluate for capacity to make decisions prior to discharge -Continue Depakote -No typical antipsychotics based on history of prolonged QTC and underlying LV dysfunction  Acute dysarthria with dystonia -MRI negative for acute changes -Symptoms resolved after discontinuation of Lyrica -Has mild residual dystonia likely related to recent cerebellar  stroke  Bilateral great toe ulcers/osteomyelitis noted in left/status post multiple toe amputations: -S/p bilateral great toe amputation on 2/2 via podiatry -2/4 resumed DVT prophylaxis with Lovenox - continue OxyContin 20 mg every 12 hours, decrease OxyIR to 5 mg every 4 hours as needed for breakthrough pain, continue IV Dilaudid for severe pain.  **TOC benefit eligibility has determined that patient has Medicaid of [place]- OxyContin has a $4 co-pay for each prescription -Continue Cipro for a total of 2 weeks (wound culture positive for Proteus vulgaris resistant to ampicillin and intermediate to Unasyn resistant to cefazolin and intermediate to imipenem.  Sensitive to Cipro.) -2/8 given acute dysarthria and dystonia have opted to discontinue Lyrica for now -We will call Dr. Kandice Hams office with podiatry to arrange outpatient postoperative visit.  At this juncture anticipate will need to leave staples in until seen by Dr. Samuella Cota to avoid wound disruption/dehiscence  Chronic combined systolic and diastolic heart failure with ejection fraction of less than 20% and grade 2 diastolic dysfunction: -Likely cocaine induced -Continues to have suboptimal blood pressure readings-Lasix discontinued on 2/10-remains stable in a -4 L balance -Continue low-dose Coreg, on 2/4 given suboptimal blood pressure reading Imdur changed to shorter acting low-dose isosorbide.  Hydralazine was also discontinued.  Continue Entresto; continue Aldactone -Strict I/O's and daily weight -  Follow-up with cardiology outpatient-appreciate cardiology's recommendation  Suspected Seizures: -EEG from 1/17 w/ diffuse encephalopathy, nonspecific etiology.  EEG from 1/19 showed evidence of epileptogenicity arising from right more than left frontal region. -EEG on 1/26 shows no seizures or epileptiform discharges were seen throughout the recording. -Continue Keppra 500 mg twice daily as per neurology's recommendations  Coronary  artery disease: -Cardiology recommended outpatient cath at least 1 month after recovery from stroke. -Continue aspirin and atorvastatin  Physical deconditioning/gait instability -Last PT evaluation was on 2/3 prior to adjustments in pain medications.  Patient was having significant difficulty with mobility secondary to pain -He was also reporting dizziness over the weekend with standing but would not allow vital signs to be checked.  We will attempt to obtain orthostatic vital signs. -We will order rolling walker and wheelchair noting patient is at high risk for falls although he is making progress needing only minimal assist to come to stand with rolling walker and minimal to mod assist x2 to stand step to the right with a rolling walker    Other problems: Acute metabolic/toxic encephalopathy: -Multifactorial in the setting of stroke, drug use, respiratory failure and sedation -Resolved -Continue folic acid, thiamine and multivitamin -Seen by psychiatry-appreciate help  Hypertension:  Blood pressure 140s/100s-likely secondary to pain with significant improvement in hypertension with improved pain control as above. -Monitor blood pressure closely  Acute transaminitis -LFTs as of 2/3 are within normal limits except for low albumin of 3.1 -Elevated LFTs likely related to shock liver phases of admission the mild elevation of total bilirubin to 1.7 therefore not consistent with acute liver failure  Oral pain w/ upper palate edema -Orthopantogram unremarkable -As of 2/10 is resolved  Cocaine use:  Counseled about cessation  Hyponatremia:  Resolved  Bipolar disorder/homelessness: -Poor compliance.  Homelessness.   Consulted psychiatry-evaluated and cleared him a danger to himself or to others. -Continue Depakote  Hyperlipidemia:  Continue statin.    Data Reviewed: Basic Metabolic Panel: No results for input(s): NA, K, CL, CO2, GLUCOSE, BUN, CREATININE, CALCIUM, MG,  PHOS in the last 168 hours. Liver Function Tests: No results for input(s): AST, ALT, ALKPHOS, BILITOT, PROT, ALBUMIN in the last 168 hours. No results for input(s): LIPASE, AMYLASE in the last 168 hours. No results for input(s): AMMONIA in the last 168 hours. CBC: Recent Labs  Lab 01/08/21 1002  WBC 7.2  NEUTROABS 3.4  HGB 13.9  HCT 41.3  MCV 92.0  PLT 288   Cardiac Enzymes: No results for input(s): CKTOTAL, CKMB, CKMBINDEX, TROPONINI in the last 168 hours. BNP (last 3 results) No results for input(s): BNP in the last 8760 hours.  ProBNP (last 3 results) No results for input(s): PROBNP in the last 8760 hours.  CBG: Recent Labs  Lab 01/07/21 0800  GLUCAP 158*    No results found for this or any previous visit (from the past 240 hour(s)).   Studies: No results found.  Scheduled Meds:  (feeding supplement) PROSource Plus  30 mL Oral BID BM   aspirin  81 mg Oral Daily   atorvastatin  40 mg Oral Daily   carvedilol  6.25 mg Oral BID WC   ciprofloxacin  500 mg Oral BID   divalproex  500 mg Oral Daily   enoxaparin (LOVENOX) injection  40 mg Subcutaneous Q24H   feeding supplement  237 mL Oral BID BM   folic acid  1 mg Oral Daily   isosorbide dinitrate  5 mg Oral TID   levETIRAcetam  500  mg Oral BID   magnesium oxide  400 mg Oral BID   multivitamin with minerals  1 tablet Oral Daily   oxyCODONE  20 mg Oral Q12H   polyethylene glycol  17 g Per Tube Daily   sacubitril-valsartan  1 tablet Oral BID   silver sulfADIAZINE   Topical BID   spironolactone  12.5 mg Oral Daily   thiamine  100 mg Oral Daily   Continuous Infusions:  lactated ringers Stopped (01/02/21 1552)    Active Problems:   Ischemic stroke (HCC)   Basilar artery embolus   Acute on chronic combined systolic and diastolic CHF (congestive heart failure) (HCC)   Respiratory failure (HCC)   Ulcer of great toe, left, with necrosis of bone (HCC)   Ulcer of great toe, right, with necrosis  of muscle (HCC)   Acute osteomyelitis of toe, left (HCC)   Osteomyelitis of great toe (HCC)   Shock liver   Acute metabolic encephalopathy   CAD (coronary artery disease)   HTN (hypertension)   Bipolar I disorder (HCC)   Cocaine abuse (HCC)   Homelessness   HLD (hyperlipidemia)   Dystonia   Dysarthria   Pain, dental   Consultants:  Neurology  Cardiology  Podiatry  Palliative  Procedures: Bilateral great toe amputation  Antibiotics: Anti-infectives (From admission, onward)   Start     Dose/Rate Route Frequency Ordered Stop   01/05/21 1145  ciprofloxacin (CIPRO) tablet 500 mg        500 mg Oral 2 times daily 01/05/21 1057 01/19/21 0759   01/02/21 1406  vancomycin (VANCOCIN) powder  Status:  Discontinued          As needed 01/02/21 1406 01/02/21 1428   12/17/20 1450  ceFAZolin (ANCEF) 2-4 GM/100ML-% IVPB       Note to Pharmacy: Cristy Friedlander   : cabinet override      12/17/20 1450 12/17/20 1911       Time spent: 20 minutes    Junious Silk ANP  [facility] 7 am - 330 pm/M-F for direct patient care and secure chat Please refer to Amion for contact info 28  days

## 2021-01-14 NOTE — Progress Notes (Signed)
Occupational Therapy Treatment Patient Details Name: [name] MRN: [id number] DOB: 10-Mar-1966 Today's Date: 01/14/2021    History of present illness Homeless 55 yo male found down with c/o CP by bystanders. 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  Extubated 1/20. S/p bil great toe amputations 01/02/21.  Stat MRI 2/8 showing evolving right cerebellar infarct without evidence of interval  new acute infarct. PMH posible DM and HF.   OT comments  Pt progressing towards established OT goals. Pt demonstrating increased participation this session; requiring increased encouragement, time, and cues. Pt requiring Min A to don second gown like jacket and then Mod A for donning post op shoes. Pt requiring Mod A +2 and Max cues for functional mobility using RW. Pt with word finding deficits and dysarthia; requiring significant time to communicate needs. Pt performing grooming task at sink with Min A; encouraging pt to incorporate RUE and needing Min-Mod A for normalizing movement patterns with targeted reach. Continue to recommend dc to SNF and will continue to follow acutely as admitted.    Follow Up Recommendations  SNF;Supervision/Assistance - 24 hour    Equipment Recommendations  3 in 1 bedside commode;Wheelchair (measurements OT);Wheelchair cushion (measurements OT)    Recommendations for Other Services      Precautions / Restrictions Precautions Precautions: Fall Restrictions Weight Bearing Restrictions: Yes RLE Weight Bearing: Weight bearing as tolerated LLE Weight Bearing: Weight bearing as tolerated Other Position/Activity Restrictions: Post-op shoes present in room       Mobility Bed Mobility Overal bed mobility: Needs Assistance Bed Mobility: Supine to Sit;Sit to Supine     Supine to sit: Supervision Sit to  supine: Supervision   General bed mobility comments: Supervision for safety  Transfers Overall transfer level: Needs assistance Equipment used: Rolling walker (2 wheeled) Transfers: Sit to/from Stand Sit to Stand: Mod assist;+2 safety/equipment (MAX CUES)         General transfer comment: Mod A +2 for power up and gaining balance. Max cues for hand placmeent, foot placement, and weight shifting    Balance Overall balance assessment: Needs assistance Sitting-balance support: Feet supported;No upper extremity supported Sitting balance-Leahy Scale: Fair Sitting balance - Comments: Static sitting EOB, supervision for safety.   Standing balance support: Bilateral upper extremity supported;During functional activity Standing balance-Leahy Scale: Poor Standing balance comment: Reiant on UE support                           ADL either performed or assessed with clinical judgement   ADL Overall ADL's : Needs assistance/impaired Eating/Feeding: Minimal assistance;Sitting Eating/Feeding Details (indicate cue type and reason): Min A for stabilizng grasp while bringing milk to mouth. Pt demonstrating impulsivity and self control; drinking milk as fast as possible             Upper Body Dressing : Sitting;Minimal assistance Upper Body Dressing Details (indicate cue type and reason): Min A for placing RUE into sleeve and donning second gown like a jacket Lower Body Dressing: Moderate assistance;Sit to/from stand Lower Body Dressing Details (indicate cue type and reason): Mod A to don shoes. pt initating and requirig Mod A to stabilize and sequence Toilet Transfer: Moderate assistance;+2 for physical assistance;+2 for safety/equipment;Ambulation (simulated recliner)           Functional mobility during ADLs: Moderate assistance;+2 for safety/equipment;Rolling walker General  ADL Comments: Pt presenitng with poor balance, coorindation, cognition, and safety     Vision    Vision Assessment?: Vision impaired- to be further tested in functional context Additional Comments: Continues to present with decreased attention to R   Perception     Praxis      Cognition Arousal/Alertness: Awake/alert Behavior During Therapy: Restless;Impulsive Overall Cognitive Status: Impaired/Different from baseline Area of Impairment: Attention;Following commands;Memory;Safety/judgement;Awareness;Problem solving                   Current Attention Level: Sustained Memory: Decreased short-term memory;Decreased recall of precautions Following Commands: Follows one step commands inconsistently;Follows one step commands with increased time Safety/Judgement: Decreased awareness of safety;Decreased awareness of deficits Awareness: Intellectual Problem Solving: Slow processing;Decreased initiation;Requires verbal cues;Difficulty sequencing;Requires tactile cues General Comments: Pt with flucuating behaviors between listening and following commands and becoming overhwlemed and leaning into his arms. Pt requring increased time and cues throughout. More motivated this session to participate. Continues to present with word finding difficulties.        Exercises     Shoulder Instructions       General Comments      Pertinent Vitals/ Pain       Pain Assessment: Faces Faces Pain Scale: Hurts even more Pain Intervention(s): Limited activity within patient's tolerance;Monitored during session;Repositioned  Home Living                                          Prior Functioning/Environment              Frequency  Min 2X/week        Progress Toward Goals  OT Goals(current goals can now be found in the care plan section)  Progress towards OT goals: Progressing toward goals  Acute Rehab OT Goals Patient Stated Goal: to go to bathroom OT Goal Formulation: With patient Time For Goal Achievement: 01/17/21 Potential to Achieve Goals: Good ADL  Goals Pt Will Perform Eating: Independently;sitting Pt Will Perform Grooming: sitting;standing;with min guard assist Pt Will Perform Upper Body Bathing: with min guard assist;sitting Pt Will Perform Lower Body Bathing: with min assist;sit to/from stand Pt Will Perform Upper Body Dressing: with min guard assist;sitting Pt Will Perform Lower Body Dressing: with min assist;sit to/from stand Pt Will Transfer to Toilet: with min assist;ambulating;bedside commode Pt Will Perform Toileting - Clothing Manipulation and hygiene: with min assist;sit to/from stand Additional ADL Goal #1: Pt will be S in and OOB for basic ADLs Additional ADL Goal #2: Pt will be able to locate all items he needs for task without cues  Plan Discharge plan remains appropriate    Co-evaluation    PT/OT/SLP Co-Evaluation/Treatment: Yes Reason for Co-Treatment: For patient/therapist safety;To address functional/ADL transfers;Complexity of the patient's impairments (multi-system involvement);Necessary to address cognition/behavior during functional activity   OT goals addressed during session: ADL's and self-care      AM-PAC OT "6 Clicks" Daily Activity     Outcome Measure   Help from another person eating meals?: A Little Help from another person taking care of personal grooming?: A Lot Help from another person toileting, which includes using toliet, bedpan, or urinal?: A Lot Help from another person bathing (including washing, rinsing, drying)?: A Lot Help from another person to put on and taking off regular upper body clothing?: A Little Help from another person to put on and taking off regular lower body  clothing?: A Lot 6 Click Score: 14    End of Session Equipment Utilized During Treatment: Gait belt;Rolling walker  OT Visit Diagnosis: Unsteadiness on feet (R26.81);Other abnormalities of gait and mobility (R26.89);Muscle weakness (generalized) (M62.81);Other symptoms and signs involving cognitive  function;Hemiplegia and hemiparesis Hemiplegia - Right/Left: Right Hemiplegia - dominant/non-dominant: Dominant Hemiplegia - caused by: Cerebral infarction   Activity Tolerance Patient tolerated treatment well   Patient Left with call bell/phone within reach;in bed;with bed alarm set   Nurse Communication Mobility status        Time: 1313-1401 OT Time Calculation (min): 48 min  Charges: OT General Charges $OT Visit: 1 Visit OT Treatments $Self Care/Home Management : 8-22 mins $Therapeutic Activity: 8-22 mins  Kalix Meinecke MSOT, OTR/L Acute Rehab Pager: [phone] Office: [phone]   Theodoro Grist Dafna Romo 01/14/2021, 2:31 PM

## 2021-01-14 NOTE — Consult Note (Signed)
[facility] Face-to-Face Psychiatry Consult   Reason for Consult: Depression, bipolar, suicidal ideation Referring Physician: Dr. Jerral Ralph Patient Identification: [name] MRN:  [id number] Principal Diagnosis: <principal problem not specified> Diagnosis:  Active Problems:   Ischemic stroke [facility])   Basilar artery embolus   Acute on chronic combined systolic and diastolic CHF (congestive heart failure) (HCC)   Respiratory failure (HCC)   Ulcer of great toe, left, with necrosis of bone (HCC)   Ulcer of great toe, right, with necrosis of muscle (HCC)   Acute osteomyelitis of toe, left (HCC)   Osteomyelitis of great toe (HCC)   Shock liver   Acute metabolic encephalopathy   CAD (coronary artery disease)   HTN (hypertension)   Bipolar I disorder (HCC)   Cocaine abuse (HCC)   Homelessness   HLD (hyperlipidemia)   Dystonia   Dysarthria   Pain, dental   Total Time spent with patient: 30 minutes  Subjective:   [name] is a 55 y.o. male patient admitted with left cerebellar infarct, code stoke. [name] is a 55 y.o. male was seen and evaluated face-to-face.  He is awake, alert and oriented, calm and cooperative today. He recently completed his physical therapy session and "Im tired. Just finished therapy." He does express his need for much assistance and not having the level fo care he needs at home. "I want to go home, I really do. But I need to stay. "  Patient has agreed to remain in the hospital to continue to receive the care he needs.  He is unable to identify his place of discharge. He is unable to identify who will provide him services and help him get around. When assessing for mobility he points to his feet, and shows his recent toe amputation. " I need some help.  He states he doesn't know where he is going when he leaves here tomorrow, and understands why that is not safe for him. He denies any suicidal thoughts, homicidal thoughts, and or auditory visual hallucination.     HPI:  55 year old gentleman, initially with unknown identity brought to the ER after he complained of chest pain and became unresponsive with left-sided weakness and left gaze deviation. Was intubated in the ER. Apparently seen by bystanders when patient complained of chest pain and all of a sudden he became unresponsive. 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 1/17-1/20, admitted to ICU, extubated to 4 L oxygen on 1/20. 1/21, transferred to [facility] service, progressive bed. Patient has different MRN in our hospital system, he has frequent hospitalization and was recently admitted and discharged a week ago with diabetic toe ulcer. Review his previous records indicate that he is homeless, he is noncompliant, uses cocaine. He has a history of COPD, bipolar 1 disorder, known chronic heart failure with recent ejection fraction 40%, ischemic heart disease and previously declined PCI.  Past Psychiatric History: Depression, bipolar disorder, and substance abuse.  Patient was previously receiving behavioral health services from [facility], however has been difficult to follow as he is homeless, primarily noncompliant, and has been unable to follow-up due to multiple inpatient hospitalizations.  Patient denies any recent suicidal thoughts, gestures, and or ideations.  Patient endorses significant cocaine use disorder, to include day of admission.  Patient with multiple inpatient hospitalizations.   Risk to Self:  Denies Risk to Others:  Denies Prior Inpatient Therapy:  Multiple Prior Outpatient Therapy:  Previously followed by [facility], however has been difficult to treat and follow as he requires frequent inpatient medical admissions.  Past Medical History: History reviewed. No pertinent past medical history.  Past Surgical History:  Procedure Laterality Date  . AMPUTATION  TOE Bilateral 01/02/2021   Procedure: AMPUTATION TOE;  Surgeon: Park Liter, DPM;  Location: [facility] OR;  Service: Podiatry;  Laterality: Bilateral;  Ok to leave patient on hospital bed  . IR ANGIO INTRA EXTRACRAN SEL COM CAROTID INNOMINATE BILAT MOD SED  12/17/2020  . IR ANGIO VERTEBRAL SEL VERTEBRAL BILAT MOD SED  12/17/2020  . RADIOLOGY WITH ANESTHESIA N/A 12/17/2020   Procedure: IR WITH ANESTHESIA;  Surgeon: Radiologist, Medication, MD;  Location: [facility] OR;  Service: Radiology;  Laterality: N/A;   Family History: History reviewed. No pertinent family history. Family Psychiatric  History: Mother with history of bipolar depression, and suicidality.  Social History:  Social History   Substance and Sexual Activity  Alcohol Use Yes     Social History   Substance and Sexual Activity  Drug Use Yes  . Types: "Crack" cocaine    Social History   Socioeconomic History  . Marital status: Single    Spouse name: Not on file  . Number of children: 4  . Years of education: Not on file  . Highest education level: Not on file  Occupational History  . Not on file  Tobacco Use  . Smoking status: Current Every Day Smoker    Types: Cigarettes  . Smokeless tobacco: Never Used  Vaping Use  . Vaping Use: Never used  Substance and Sexual Activity  . Alcohol use: Yes  . Drug use: Yes    Types: "Crack" cocaine  . Sexual activity: Not Currently    Partners: Female  Other Topics Concern  . Not on file  Social History Narrative  . Not on file   Social Determinants of Health   Financial Resource Strain: Not on file  Food Insecurity: Not on file  Transportation Needs: Not on file  Physical Activity: Not on file  Stress: Not on file  Social Connections: Not on file   Additional Social History:    Allergies:   Allergies  Allergen Reactions  . Ibuprofen Anaphylaxis  . Latex Other (See Comments)    Reaction??  Roselee Nova Cr [Pregabalin Er] Other (See Comments)    Dystonia and dysarthria  .  Peanut-Containing Drug Products Other (See Comments)    Allergic to peanut butter (reaction??)  . Tylenol [Acetaminophen] Hives and Nausea And Vomiting    Per pt he can't have it because it will kill him.    Labs:  No results found for this or any previous visit (from the past 48 hour(s)).  Current Facility-Administered Medications  Medication Dose Route Frequency Provider Last Rate Last Admin  . (feeding supplement) PROSource Plus liquid 30 mL  30 mL Oral BID BM Park Liter, DPM   30 mL at 01/14/21 0926  . acetaminophen (TYLENOL) tablet 650 mg  650 mg Oral Q4H PRN Park Liter, DPM   650 mg at 01/07/21 8841   Or  . acetaminophen (TYLENOL) 160 MG/5ML solution 650 mg  650 mg Per Tube Q4H PRN Park Liter, DPM       Or  . acetaminophen (TYLENOL) suppository 650 mg  650 mg Rectal Q4H PRN Park Liter, DPM      . aspirin chewable tablet 81 mg  81 mg Oral Daily Park Liter, DPM  81 mg at 01/14/21 0926  . atorvastatin (LIPITOR) tablet 40 mg  40 mg Oral Daily Park Liter, DPM   40 mg at 01/14/21 0926  . carvedilol (COREG) tablet 6.25 mg  6.25 mg Oral BID WC Park Liter, DPM   6.25 mg at 01/14/21 0754  . ciprofloxacin (CIPRO) tablet 500 mg  500 mg Oral BID Pahwani, Rinka R, MD   500 mg at 01/14/21 0755  . divalproex (DEPAKOTE ER) 24 hr tablet 500 mg  500 mg Oral Daily Park Liter, DPM   500 mg at 01/14/21 6333  . docusate (COLACE) 50 MG/5ML liquid 100 mg  100 mg Oral Daily PRN Park Liter, DPM   100 mg at 01/06/21 2252  . enoxaparin (LOVENOX) injection 40 mg  40 mg Subcutaneous Q24H Junious Silk L, NP   40 mg at 01/13/21 1441  . feeding supplement (ENSURE ENLIVE / ENSURE PLUS) liquid 237 mL  237 mL Oral BID BM Park Liter, DPM   237 mL at 01/14/21 0927  . folic acid (FOLVITE) tablet 1 mg  1 mg Oral Daily Park Liter, DPM   1 mg at 01/14/21 5456  . HYDROmorphone (DILAUDID) injection 0.5 mg  0.5 mg Intravenous Q4H PRN Pahwani, Rinka R, MD   0.5  mg at 01/09/21 1609  . isosorbide dinitrate (ISORDIL) tablet 5 mg  5 mg Oral TID Russella Dar, NP   5 mg at 01/14/21 1032  . lactated ringers infusion   Intravenous Continuous Park Liter, DPM   Stopped at 01/02/21 1552  . levETIRAcetam (KEPPRA) tablet 500 mg  500 mg Oral BID Park Liter, DPM   500 mg at 01/14/21 2563  . LORazepam (ATIVAN) tablet 1 mg  1 mg Oral Q4H PRN Park Liter, DPM   1 mg at 01/13/21 2101  . magnesium oxide (MAG-OX) tablet 400 mg  400 mg Oral BID Park Liter, DPM   400 mg at 01/14/21 8937  . multivitamin with minerals tablet 1 tablet  1 tablet Oral Daily Park Liter, DPM   1 tablet at 01/14/21 3428  . ondansetron (ZOFRAN) injection 4 mg  4 mg Intravenous Q6H PRN Park Liter, DPM      . oxyCODONE (Oxy IR/ROXICODONE) immediate release tablet 5 mg  5 mg Oral Q4H PRN Russella Dar, NP   5 mg at 01/14/21 0112  . oxyCODONE (OXYCONTIN) 12 hr tablet 20 mg  20 mg Oral Q12H Russella Dar, NP   20 mg at 01/14/21 0929  . phenol (CHLORASEPTIC) mouth spray 1 spray  1 spray Mouth/Throat PRN Park Liter, DPM   1 spray at 01/08/21 2103  . polyethylene glycol (MIRALAX / GLYCOLAX) packet 17 g  17 g Per Tube Daily Park Liter, DPM   17 g at 01/14/21 0926  . sacubitril-valsartan (ENTRESTO) 24-26 mg per tablet  1 tablet Oral BID Park Liter, DPM   1 tablet at 01/14/21 1032  . silver sulfADIAZINE (SILVADENE) 1 % cream   Topical BID Park Liter, DPM   Given at 01/14/21 1032  . spironolactone (ALDACTONE) tablet 12.5 mg  12.5 mg Oral Daily Park Liter, DPM   12.5 mg at 01/14/21 1032  . thiamine tablet 100 mg  100 mg Oral Daily Park Liter, DPM   100 mg at 01/14/21 7681    Musculoskeletal: Strength & Muscle Tone: within normal limits Gait & Station: normal Patient leans: N/A  Psychiatric Specialty Exam: Physical Exam Vitals and nursing note reviewed.  Neurological:     Comments: Focal deficit noted   Psychiatric:         Mood and Affect: Mood normal.        Behavior: Behavior normal.     Review of Systems  Gastrointestinal: Positive for constipation.  Psychiatric/Behavioral: Positive for confusion. Negative for agitation, behavioral problems, decreased concentration, dysphoric mood, hallucinations, self-injury, sleep disturbance and suicidal ideas. The patient is not nervous/anxious and is not hyperactive.   All other systems reviewed and are negative.   Blood pressure 100/72, pulse 79, temperature 98 F (36.7 C), temperature source Oral, resp. rate 18, height 5\' 9"  (1.753 m), weight 78.7 kg, SpO2 98 %.Body mass index is 25.62 kg/m.  General Appearance: Casual  Eye Contact:  Minimal  Speech:  Garbled and Slow  Volume:  Normal  Mood:  Anxious  Affect:  Appropriate and Congruent  Thought Process:  Goal Directed, Linear and Descriptions of Associations: Intact  Orientation:  Full (Time, Place, and Person)  Thought Content:  Logical, Rumination and Tangential  Suicidal Thoughts:  No  Homicidal Thoughts:  No  Memory:  Immediate;   Fair Recent;   Poor  Judgement:  NA  Insight:  Fair  Psychomotor Activity:  NA  Concentration:  Concentration: Fair and Attention Span: Fair  Recall:  Poor  Fund of Knowledge:  Fair  Language:  Fair  Akathisia:  No  Handed:  Right  AIMS (if indicated):     Assets:  Desire for Improvement Resilience  ADL's:  Intact  Cognition:  WNL  Sleep:      [name] is a 55 year old male who presented with left cerebellar infarct, with significant history for bipolar disorder, and cocaine use disorder.  Patient is very well-known to our service, and at baseline is irritable, agitated, and blunt.  As previously noted patient continues to exhibit significant agitation, irritability, and anger that impacts his overall care he is receiving while in the hospital. He was recently declined inpatient services, such as PT and speech that continue to effect his progression during this  hospitalization. Patient presents with severe dysarthria, although he is alert and oriented x 3, he remains confused after talking to him. He isa able to verbalize his wants vs needs " I want to leave, but I need to stay. Im not ready to go. Im weak and need help."  Patient denies suicidal ideations, homicidal ideations, and or auditory visual hallucinations.   Patient does not meet criteria for inpatient psychiatric hospitalization at this time. Patient reports compliance with medications.   Treatment Plan Summary: Plan We will resume patient's home medication.  -[facility], and assistance with outpatient resources for behavioral health.  If patient is interested after medically stable and appropriate for discharge, consider referral to substance abuse residential treatment facility if he agrees. -Depakote ER 500 mg p.o. daily has been resumed for seizure management. If patient continues to show some irritability and mood instability, recommend increasing Depakote.  -As noted patient at baseline is irritable, likely increased 2/t stroke. He gets frustrated with word finding and thought processing. Patient with EF of 20%, prolonged QTC of 515. -As noted above patient does have some improved insight, however lacks capacity to make medical decisions. At the end of the psychiatric evalution he does agree to continue to stay in the hospital "I need to do what is right. "  -  Patient does not have safe disposition plan in place.  Will recommend continuing working with social work to facilitate assistance with SNF placement. -Recommend speech therapist order, for severe dysartharia.   Disposition: No evidence of imminent risk to self or others at present.   Patient does not meet criteria for psychiatric inpatient admission. Supportive therapy provided about ongoing stressors. Discussed crisis plan, support from social  network, calling 911, coming to the Emergency Department, and calling Suicide Hotline.  Maryagnes Amos, FNP 01/14/2021 2:29 PM

## 2021-01-14 NOTE — Progress Notes (Addendum)
Pt refused vital signs with Nurse Tech, currently refusing vital signs from RN, and refusing all night time medications. After thorough explanation and teaching on the possible effects of not taking medications, patient still refused. Patient was seen to visibly get agitated and wanted to be left alone.   Charge nurse and night MD notified.

## 2021-01-14 NOTE — Progress Notes (Signed)
Physical Therapy Treatment Patient Details Name: [name] MRN: [id number] DOB: Aug 18, 1966 Today's Date: 01/14/2021    History of Present Illness Homeless 55 yo male found down with c/o CP by bystanders. 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  Extubated 1/20. S/p bil great toe amputations 01/02/21.  Stat MRI 2/8 showing evolving right cerebellar infarct without evidence of interval  new acute infarct. PMH posible DM and HF.    PT Comments    Patient progressing slowly towards PT goals. Less impulsive today. Requires increased time to perform all mobility, "wait a minute." Requires max multi modal cues prior to each transfer and does not have any carry over to the next standing bout. Tolerated short bouts of gait training with use of RW and Min-Mod A for safety due to right lateral lean, narrow boS and impaired balance. Pt with fluctuating behaviors throughout session- appearing frustrated, irritated, pleasant. Continues to have pain in bil feet. Would NOT recommend using rollator at this time due to safety concerns and balance deficits. Continue to recommend SNF if possible to help maximize independence and mobility. Pt agreeable. Will follow.   Follow Up Recommendations  SNF;Supervision/Assistance - 24 hour     Equipment Recommendations  Wheelchair (measurements PT);Wheelchair cushion (measurements PT);Rolling walker with 5" wheels    Recommendations for Other Services       Precautions / Restrictions Precautions Precautions: Fall Precaution Comments: orthostatic hypotension; pt easily agitated due to psych h/o, is very unsafe; s/p bil great toe amputation 01/02/21 Restrictions Weight Bearing Restrictions: Yes RLE Weight Bearing: Weight bearing as tolerated LLE Weight Bearing: Weight bearing as tolerated Other  Position/Activity Restrictions: Post-op shoes present in room    Mobility  Bed Mobility Overal bed mobility: Needs Assistance Bed Mobility: Supine to Sit     Supine to sit: Supervision Sit to supine: Supervision   General bed mobility comments: Supervision for safety, HOB flat    Transfers Overall transfer level: Needs assistance Equipment used: Rolling walker (2 wheeled) Transfers: Sit to/from Stand Sit to Stand: Mod assist;+2 safety/equipment Stand pivot transfers: Min assist;+2 safety/equipment       General transfer comment: Mod A +2 for power up and gaining balance. Max cues for hand placmeent, foot placement, and weight shifting. Increased time between transitions. Stood from [employer], from chair x3. Right lateral lean and narrow boS. SPT chair to bed with Min A for RW management and balance.  Ambulation/Gait Ambulation/Gait assistance: Min assist;Mod assist;+2 safety/equipment Gait Distance (Feet): 10 Feet (+ 8') Assistive device: Rolling walker (2 wheeled) Gait Pattern/deviations: Decreased step length - left;Trunk flexed;Step-through pattern;Narrow base of support;Decreased step length - right;Decreased dorsiflexion - right;Decreased dorsiflexion - left;Decreased stride length;Ataxic;Steppage Gait velocity: reduced   General Gait Details: Slow, unsteady and ataxic like gait with narrow BoS and right lateral lean. Cues to widen BoS, RW management and to correct midline posture. 1 seated rest break attempting to sit prematurely. Deliberate steppage gait to advance BLEs.   Stairs             Wheelchair Mobility    Modified Rankin (Stroke Patients Only) Modified Rankin (Stroke Patients Only) Pre-Morbid Rankin Score: No symptoms Modified Rankin: Moderately severe disability     Balance Overall balance assessment: Needs assistance Sitting-balance support: Feet supported;No upper extremity supported Sitting balance-Leahy Scale: Fair Sitting balance - Comments:  Static sitting EOB, supervision for safety.  Standing balance support: During functional activity Standing balance-Leahy Scale: Poor Standing balance comment: Reliant on UE support                            Cognition Arousal/Alertness: Awake/alert Behavior During Therapy: Restless;Impulsive Overall Cognitive Status: Impaired/Different from baseline Area of Impairment: Attention;Following commands;Memory;Safety/judgement;Awareness;Problem solving                   Current Attention Level: Sustained Memory: Decreased short-term memory;Decreased recall of precautions Following Commands: Follows one step commands inconsistently;Follows one step commands with increased time Safety/Judgement: Decreased awareness of safety;Decreased awareness of deficits Awareness: Intellectual Problem Solving: Slow processing;Decreased initiation;Requires verbal cues;Difficulty sequencing;Requires tactile cues General Comments: Pt with fluctuating behaviors between listening and following commands and becoming overwhelmed and leaning into his arms. Pt requring increased time and cues throughout. More motivated this session to participate. Continues to present with word finding difficulties. Easily frustrated. "How long is this going to take?" referring to getting better.      Exercises      General Comments        Pertinent Vitals/Pain Pain Assessment: Faces Faces Pain Scale: Hurts even more Pain Location: bil feet Pain Descriptors / Indicators: Sore;Operative site guarding;Grimacing Pain Intervention(s): Monitored during session;Premedicated before session;Repositioned    Home Living                      Prior Function            PT Goals (current goals can now be found in the care plan section) Acute Rehab PT Goals Patient Stated Goal: to go to bathroom Progress towards PT goals: Progressing toward goals (slowly)    Frequency    Min 3X/week      PT  Plan Current plan remains appropriate    Co-evaluation PT/OT/SLP Co-Evaluation/Treatment: Yes Reason for Co-Treatment: Necessary to address cognition/behavior during functional activity;Complexity of the patient's impairments (multi-system involvement);For patient/therapist safety;To address functional/ADL transfers PT goals addressed during session: Mobility/safety with mobility;Balance;Proper use of DME OT goals addressed during session: ADL's and self-care      AM-PAC PT "6 Clicks" Mobility   Outcome Measure  Help needed turning from your back to your side while in a flat bed without using bedrails?: None Help needed moving from lying on your back to sitting on the side of a flat bed without using bedrails?: None Help needed moving to and from a bed to a chair (including a wheelchair)?: A Lot Help needed standing up from a chair using your arms (e.g., wheelchair or bedside chair)?: A Lot Help needed to walk in hospital room?: A Lot Help needed climbing 3-5 steps with a railing? : Total 6 Click Score: 15    End of Session Equipment Utilized During Treatment: Gait belt Activity Tolerance: Patient tolerated treatment well Patient left: in bed;with call bell/phone within reach;with bed alarm set Nurse Communication: Mobility status PT Visit Diagnosis: Unsteadiness on feet (R26.81);Other symptoms and signs involving the nervous system (R29.898);Other abnormalities of gait and mobility (R26.89);Ataxic gait (R26.0);Difficulty in walking, not elsewhere classified (R26.2)     Time: 1313-1401 PT Time Calculation (min) (ACUTE ONLY): 48 min  Charges:  $Gait Training: 8-22 mins                     Vale Haven, PT, DPT Acute Rehabilitation Services Pager [phone] Office [phone]       Blake Divine A Lanier Ensign 01/14/2021, 3:24  PM   

## 2021-01-14 NOTE — Progress Notes (Signed)
Dressing change was done on amputated bilateral toes

## 2021-01-15 MED ORDER — OXYCODONE HCL ER 30 MG PO T12A: 30.0000 mg | EXTENDED_RELEASE_TABLET | Freq: Two times a day (BID) | ORAL | 0 refills | Status: DC

## 2021-01-15 MED ORDER — OXYCODONE HCL ER 10 MG PO T12A
10.0000 mg | EXTENDED_RELEASE_TABLET | ORAL | Status: DC
Start: 2021-01-15 — End: 2021-01-15
  Filled 2021-01-15: qty 1

## 2021-01-15 MED ORDER — METHOCARBAMOL 500 MG PO TABS
500.0000 mg | ORAL_TABLET | Freq: Three times a day (TID) | ORAL | Status: DC
  Administered 2021-01-15 – 2021-01-24 (×22): 500 mg via ORAL
  Filled 2021-01-15 (×26): qty 1

## 2021-01-15 MED ORDER — MORPHINE SULFATE ER 15 MG PO TBCR
30.0000 mg | EXTENDED_RELEASE_TABLET | Freq: Two times a day (BID) | ORAL | Status: DC
  Administered 2021-01-15 – 2021-01-22 (×12): 30 mg via ORAL
  Filled 2021-01-15 (×13): qty 2

## 2021-01-15 MED ORDER — OXYCODONE HCL ER 15 MG PO T12A: 30.0000 mg | EXTENDED_RELEASE_TABLET | Freq: Two times a day (BID) | ORAL | Status: DC

## 2021-01-15 NOTE — Progress Notes (Addendum)
[facility] PROGRESS NOTE  [name] EUM:[id number] DOB: 1966/07/17 DOA: 12/17/2020 PCP: [name]  Status: Remains inpatient appropriate because:Ongoing active pain requiring inpatient pain management, Unsafe d/c plan and IV treatments appropriate due to intensity of illness or inability to take PO   Dispo:  The patient is from: homeless              Anticipated d/c is to: homeless shelter or streets-patient plans on taking a bus from [place] to [place] and living with "friends" after arrival              Anticipated d/c date is: > 3 days              Patient currently is not medically stable to d/c.  Barriers to discharge: Patient is homeless and currently has significant gait and ambulation issues secondary to recent bilateral amputation of toes.  This is further complicated by inadequate pain control.  And although patient has Medicaid he has a significant substance abuse history and therefore is unlikely to be accepted by nursing homes in the area.  As of 2/10 patient continues to have suboptimal blood pressure readings requiring adjustments in his cardiac medications.  Also he has been reevaluated by psychiatry and given his lack of insight into his current condition and inability to provide a safe discharge plan it is recommended patient stay in the hospital while he continues to recover.  Patient is also now reporting he prefers to stay in hospital longer as well given his difficulty performing basic tasks.  He continues to have issues with expressive aphasia and dysarthria therefore I have asked for speech therapy to come and reevaluate the patient   Difficult to place patient Yes   Level of care: Med-Surg  Code Status: Full Family Communication: Patient only DVT prophylaxis: Lovenox Vaccination status: Does not appear to be vaccinated against Covid  Foley catheter: No  HPI: 55 year old male with past medical history of coronary artery disease, chronic  systolic CHF, CVA presented with chest pain and became unresponsive with left-sided weakness and left gaze deviation.  Was intubated in the ER.  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  Extubated on 1/20 and transferred to [name] on 1/21.  Subjective: Discussed with patient previous conversations regarding requirement for SNF and lack of quick progress as well as his previous request to discharge home followed by request to stay in the hospital.  He apparently had similar thought process while speaking with psychiatric provider on 2/14.  He now states he agrees that staying in the hospital for further therapy would be best.  He is also reporting inadequate pain control and is aware that cannot resume Lyrica due to side effects.  Objective: Vitals:   01/14/21 1206 01/14/21 1712  BP: 100/72 116/70  Pulse: 79 82  Resp: 18   Temp: 98 F (36.7 C)   SpO2:      Intake/Output Summary (Last 24 hours) at 01/15/2021 0803 Last data filed at 01/15/2021 0500 Gross per 24 hour  Intake 240 ml  Output 700 ml  Net -460 ml   Filed Weights   01/11/21 0352 01/12/21 0500 01/13/21 0317  Weight: 95.1 kg 88.3 kg 78.7 kg    Exam:  Constitutional: Awake, baseline mentation, pleasant with this writer Respiratory: Posterior lung sounds are clear to auscultation and he is stable on room air Cardiovascular: Heart sounds are normal S1-S2, no  peripheral edema, adequate capillary refill with notable warm extremities. Abdomen: Soft nontender and nondistended with normoactive bowel sounds.  Oral intake but for the most part 90 to 100% of meals eaten.  LBM 2/14 Skin: Bilateral toe amputation clean with some minor blood ooze and intact staple without any dehiscence of wound or purulent drainage. Neurologic: CN 2-12 grossly intact. Sensation intact, Strength 4-5/5 x all 4 extremities.  Continues to have dystonic  movements and some dysarthria but markedly improved as compared 2/9 and suspect this is more related to cerebellar injury from recent stroke. Psychiatric: Pleasant with myself and [name] at bedside.  Became more short and unpleasant when nurse entered room to asking if she was ready for the upper seat to be changed.  He is alert and oriented to name, place and time and has improved insight into his current medical status and physical debility that requires him to remain in the hospital to continue to receive therapy that he would otherwise receive at a skilled nursing facility.   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 -A1c: 6.4%, LDL: 72, echo shows ejection fraction less than 20% with grade 2 diastolic dysfunction -Neurology signed off -PT/OT recommended SNF but patient prefers to discharge with friends -According to PT note from 2/7 patient remains impulsive with no awareness of safety/deficits.  He is a high fall risk.  Stating he wants to go home and is again refusing SNF.  Recommend rolling walker and wheelchair if he declines SNF.This has already been requested -Repeat MRI on 2/8 as below-no acute infarct-suspect dysarthria and dystonia secondary to side effects from Lyrica as described below  Bipolar disorder/homelessness: -Poor compliance.  Homelessness.   -Consulted psychiatry early during the hospitalization-evaluated and cleared him a danger to himself or to others.  Patient on 2/1 the patient did meet criteria for decision making to go home if desired to under AMA status Repeat evaluation on 2/14.  Patient has  improved insight but lacked capacity to make medical decisions.  He was able to state to the psychiatric provider that he agreed to continue to stay in the hospital "I need to do what is right". -Continue Depakote -No typical antipsychotics based on history of prolonged QTC and underlying LV dysfunction  Acute dysarthria with dystonia -MRI negative for acute changes -Symptoms resolved after discontinuation of Lyrica -Has mild residual dystonia likely related to recent cerebellar stroke  Bilateral great toe ulcers/osteomyelitis noted in left/status post multiple toe amputations: -S/p bilateral great toe amputation on 2/2 via podiatry -2/4 resumed DVT prophylaxis with Lovenox -Discussed with TOC pharmacist.  We will change OxyContin to long-acting morphine.  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  Hydralazine was also discontinued.  Continue Entresto; continue Aldactone -Strict I/O's and daily weight -Follow-up with cardiology outpatient-appreciate cardiology's recommendation  Suspected Seizures: -EEG from 1/17 w/ diffuse encephalopathy, nonspecific etiology.  EEG from 1/19 showed evidence of epileptogenicity arising from right more than left frontal region. -EEG on 1/26 shows no seizures or epileptiform discharges were seen  throughout the recording. -Continue Keppra 500 mg twice daily as per neurology's recommendations  Coronary artery disease: -Cardiology recommended outpatient cath at least 1 month after recovery from stroke. -Continue aspirin and atorvastatin  Physical deconditioning/gait instability -Last PT evaluation was on 2/3 prior to adjustments in pain medications.  Patient was having significant difficulty with mobility secondary to pain -He was also reporting dizziness over the weekend with standing but would not allow vital signs to be checked.  We will attempt to obtain orthostatic vital signs. -We will order rolling walker and wheelchair noting patient is at high risk for falls although he is making progress needing only minimal assist to come to stand with rolling walker and minimal to mod assist x2 to stand step to the right with a rolling walker    Other problems: Acute metabolic/toxic encephalopathy: -Multifactorial in the setting of stroke, drug use, respiratory failure and sedation -Resolved -Continue folic acid, thiamine and multivitamin -Seen by psychiatry-appreciate help  Hypertension:  Blood pressure 140s/100s-likely secondary to pain with significant improvement in hypertension with improved pain control as above. -Monitor blood pressure closely  Acute transaminitis -LFTs as of 2/3 are within normal limits except for low albumin of 3.1 -Elevated LFTs likely related to shock liver phases of admission the mild elevation of total bilirubin to 1.7 therefore not consistent with acute liver failure  Oral pain w/ upper palate edema -Orthopantogram unremarkable -As of 2/10 is resolved  Cocaine use:  Counseled about cessation  Hyponatremia:  Resolved  Bipolar disorder/homelessness: -Poor compliance.  Homelessness.   Consulted psychiatry-evaluated and cleared him a danger to himself or to others. -Continue Depakote  Hyperlipidemia:  Continue statin.    Data  Reviewed: Basic Metabolic Panel: No results for input(s): NA, K, CL, CO2, GLUCOSE, BUN, CREATININE, CALCIUM, MG, PHOS in the last 168 hours. Liver Function Tests: No results for input(s): AST, ALT, ALKPHOS, BILITOT, PROT, ALBUMIN in the last 168 hours. No results for input(s): LIPASE, AMYLASE in the last 168 hours. No results for input(s): AMMONIA in the last 168 hours. CBC: Recent Labs  Lab 01/08/21 1002  WBC 7.2  NEUTROABS 3.4  HGB 13.9  HCT 41.3  MCV 92.0  PLT 288   Cardiac Enzymes: No results for input(s): CKTOTAL, CKMB, CKMBINDEX, TROPONINI in the last 168 hours. BNP (last 3 results) No results for input(s): BNP in the last 8760 hours.  ProBNP (last 3 results) No results for input(s): PROBNP in the last 8760 hours.  CBG: No results for input(s): GLUCAP in the last 168 hours.  No results found for this or any previous visit (from the past 240 hour(s)).   Studies: No results found.  Scheduled Meds:  (feeding supplement) PROSource Plus  30 mL Oral BID BM   aspirin  81 mg Oral Daily   atorvastatin  40 mg Oral Daily   carvedilol  6.25 mg Oral BID WC   ciprofloxacin  500 mg Oral BID   divalproex  500 mg Oral Daily   enoxaparin (LOVENOX) injection  40 mg Subcutaneous Q24H   feeding supplement  237 mL  Oral BID BM   folic acid  1 mg Oral Daily   isosorbide dinitrate  5 mg Oral TID   levETIRAcetam  500 mg Oral BID   magnesium oxide  400 mg Oral BID   multivitamin with minerals  1 tablet Oral Daily   oxyCODONE  20 mg Oral Q12H   polyethylene glycol  17 g Per Tube Daily   sacubitril-valsartan  1 tablet Oral BID   silver sulfADIAZINE   Topical BID   spironolactone  12.5 mg Oral Daily   thiamine  100 mg Oral Daily   Continuous Infusions:  lactated ringers Stopped (01/02/21 1552)    Active Problems:   Ischemic stroke (HCC)   Basilar artery embolus   Acute on chronic combined systolic and diastolic CHF (congestive heart failure) (HCC)    Respiratory failure (HCC)   Ulcer of great toe, left, with necrosis of bone (HCC)   Ulcer of great toe, right, with necrosis of muscle (HCC)   Acute osteomyelitis of toe, left (HCC)   Osteomyelitis of great toe (HCC)   Shock liver   Acute metabolic encephalopathy   CAD (coronary artery disease)   HTN (hypertension)   Bipolar I disorder (HCC)   Cocaine abuse (HCC)   Homelessness   HLD (hyperlipidemia)   Dystonia   Dysarthria   Pain, dental   Consultants:  Neurology  Cardiology  Podiatry  Palliative  Procedures: Bilateral great toe amputation  Antibiotics: Anti-infectives (From admission, onward)   Start     Dose/Rate Route Frequency Ordered Stop   01/14/21 0000  ciprofloxacin (CIPRO) 500 MG tablet        500 mg Oral 2 times daily 01/14/21 1223     01/05/21 1145  ciprofloxacin (CIPRO) tablet 500 mg        500 mg Oral 2 times daily 01/05/21 1057 01/19/21 0759   01/02/21 1406  vancomycin (VANCOCIN) powder  Status:  Discontinued          As needed 01/02/21 1406 01/02/21 1428   12/17/20 1450  ceFAZolin (ANCEF) 2-4 GM/100ML-% IVPB       Note to Pharmacy: Cristy Friedlander   : cabinet override      12/17/20 1450 12/17/20 1911       Time spent: 20 minutes    Junious Silk ANP  [facility] 7 am - 330 pm/M-F for direct patient care and secure chat Please refer to Amion for contact info 29  days

## 2021-01-15 NOTE — Progress Notes (Addendum)
2pm: CSW completed pre-authorization form and it was signed by Junious Silk, NP - form was faxed to [facility].  11:30am: CSW spoke with [facility] supervisor Steward Drone to assist with pre-authorization request from [facility] for patient's medication.  Edwin Dada, MSW, [name] Transitions of Care   Clinical Social Worker II [phone]

## 2021-01-15 NOTE — Plan of Care (Signed)
  Problem: Self-Care: Goal: Ability to communicate needs accurately will improve Outcome: Progressing   Problem: Nutrition: Goal: Risk of aspiration will decrease Outcome: Progressing Goal: Dietary intake will improve Outcome: Progressing   Problem: Education: Goal: Individualized Educational Video(s) Outcome: Progressing   Problem: Ischemic Stroke/TIA Tissue Perfusion: Goal: Complications of ischemic stroke/TIA will be minimized Outcome: Progressing   Problem: Education: Goal: Knowledge of secondary prevention will improve Outcome: Progressing   

## 2021-01-16 ENCOUNTER — Telehealth: Payer: Self-pay | Admitting: Podiatry

## 2021-01-16 NOTE — Progress Notes (Signed)
Physical Therapy Treatment Patient Details Name: [name] MRN: [id number] DOB: 05-Feb-1966 Today's Date: 01/16/2021    History of Present Illness Homeless 55 yo male found down with c/o CP by bystanders. 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 Extubated 1/20. S/p bil great toe amputations 01/02/21.    PT Comments    Pt is making slow progress towards his goals as he is becoming more consistent with participating in gait training, ambulating short bedroom distances with a RW and min-modA. However, he continues to be limited in progress by his wavering attitudes of participatory and then agitated and refusing to continue. He has poor cognition into his deficits and safety awareness along with his ataxic-like movements that place him at risk for falls. Will continue to follow acutely. Current recommendations remain appropriate.    Follow Up Recommendations  SNF;Supervision/Assistance - 24 hour     Equipment Recommendations  Wheelchair (measurements PT);Wheelchair cushion (measurements PT);Rolling walker with 5" wheels    Recommendations for Other Services       Precautions / Restrictions Precautions Precautions: Fall Precaution Comments: orthostatic hypotension; pt easily agitated due to psych h/o, is very unsafe; s/p bil great toe amputation 01/02/21 Restrictions Weight Bearing Restrictions: Yes RLE Weight Bearing: Weight bearing as tolerated LLE Weight Bearing: Weight bearing as tolerated Other Position/Activity Restrictions: Post-op shoes present in room    Mobility  Bed Mobility Overal bed mobility: Needs Assistance Bed Mobility: Supine to Sit     Supine to sit: Supervision     General bed mobility comments: Bed flat, cuing pt to come sit L EOB. Supervision and extra time for all for  safety.    Transfers Overall transfer level: Needs assistance Equipment used: Rolling walker (2 wheeled) Transfers: Sit to/from Stand Sit to Stand: Min assist;+2 physical assistance;+2 safety/equipment         General transfer comment: Sit to stand 1x from EOB and 1x from chair without arm rests, pt pulling up on RW despite cues to try to push up from current sitting surface. MinAx1-2 for stability and safety.  Ambulation/Gait Ambulation/Gait assistance: Min assist;Mod assist Gait Distance (Feet): 12 Feet (x2 bouts of ~12 ft > ~10 ft) Assistive device: Rolling walker (2 wheeled) Gait Pattern/deviations: Decreased step length - left;Trunk flexed;Step-through pattern;Narrow base of support;Decreased step length - right;Decreased dorsiflexion - right;Decreased dorsiflexion - left;Shuffle;Decreased stride length;Ataxic Gait velocity: reduced Gait velocity interpretation: <1.31 ft/sec, indicative of household ambulator General Gait Details: Slow, unsteady and ataxic like gait with narrow BOS. Cues to widen BOS and take long symmetrical steps bil, poor pt attention to cues. RW management and stability provided, primarily with turns as he tends to pick up the RW and place it distal to him despite cues. x 1 seated rest break attempting to sit prematurely.   Stairs             Wheelchair Mobility    Modified Rankin (Stroke Patients Only) Modified Rankin (Stroke Patients Only) Pre-Morbid Rankin Score: No symptoms Modified Rankin: Moderately severe disability     Balance Overall balance assessment: Needs assistance Sitting-balance support: Feet supported;No upper extremity supported Sitting balance-Leahy Scale: Fair Sitting balance - Comments: Static sitting EOB, supervision for safety.   Standing balance support: Bilateral upper extremity supported;During functional activity Standing balance-Leahy Scale: Poor Standing balance comment: Reliant on UE support on RW and external  assist.                            Cognition Arousal/Alertness: Awake/alert Behavior During Therapy: Impulsive;Agitated Overall Cognitive Status: Impaired/Different from baseline Area of Impairment: Attention;Memory;Following commands;Safety/judgement;Awareness;Problem solving                   Current Attention Level: Selective Memory: Decreased short-term memory;Decreased recall of precautions Following Commands: Follows one step commands with increased time;Follows one step commands inconsistently Safety/Judgement: Decreased awareness of safety;Decreased awareness of deficits Awareness: Intellectual Problem Solving: Slow processing;Decreased initiation;Requires verbal cues;Difficulty sequencing;Requires tactile cues General Comments: Pt with fluctuating behaviors between listening and following commands and becoming overwhelmed and leaning into his arms. Pt requring increased time and cues throughout. Continues to present with word finding difficulties. Easily frustrated. States "do not rush me" repeatedly even though he was instructed to take his time and there was no rush.      Exercises      General Comments        Pertinent Vitals/Pain Pain Assessment: Faces Faces Pain Scale: Hurts even more Pain Location: Feet Pain Descriptors / Indicators: Sore;Operative site guarding;Grimacing Pain Intervention(s): Limited activity within patient's tolerance;Monitored during session;Repositioned    Home Living                      Prior Function            PT Goals (current goals can now be found in the care plan section) Acute Rehab PT Goals Patient Stated Goal: to go to bathroom PT Goal Formulation: With patient Time For Goal Achievement: 01/30/21 Potential to Achieve Goals: Fair Progress towards PT goals: Progressing toward goals (slowly)    Frequency    Min 3X/week      PT Plan Current plan remains appropriate    Co-evaluation               AM-PAC PT "6 Clicks" Mobility   Outcome Measure  Help needed turning from your back to your side while in a flat bed without using bedrails?: None Help needed moving from lying on your back to sitting on the side of a flat bed without using bedrails?: None Help needed moving to and from a bed to a chair (including a wheelchair)?: A Lot Help needed standing up from a chair using your arms (e.g., wheelchair or bedside chair)?: A Lot Help needed to walk in hospital room?: A Lot Help needed climbing 3-5 steps with a railing? : Total 6 Click Score: 15    End of Session Equipment Utilized During Treatment: Gait belt Activity Tolerance: Treatment limited secondary to agitation Patient left: with call bell/phone within reach;in chair;with chair alarm set Nurse Communication: Mobility status;Other (comment) (notified RN pt was in chair with alarm set and was refusing to participate further with PT) PT Visit Diagnosis: Unsteadiness on feet (R26.81);Other symptoms and signs involving the nervous system (R29.898);Other abnormalities of gait and mobility (R26.89);Ataxic gait (R26.0);Difficulty in walking, not elsewhere classified (R26.2)     Time: 1039-1100 PT Time Calculation (min) (ACUTE ONLY): 21 min  Charges:  $Gait Training: 8-22 mins                     Raymond Gurney, PT, DPT Acute Rehabilitation Services  Pager: [phone] Office: [phone]    Jewel Baize 01/16/2021, 12:38 PM

## 2021-01-16 NOTE — Progress Notes (Signed)
[facility] PROGRESS NOTE  [name] ZOX:[id number] DOB: [birth year] DOA: 12/17/2020 PCP: [name]    01/16/2021   Status: Remains inpatient appropriate because:Ongoing active pain requiring inpatient pain management, Unsafe d/c plan and IV treatments appropriate due to intensity of illness or inability to take PO   Dispo:  The patient is from: homeless              Anticipated d/c is to: homeless shelter or streets-patient plans on taking a bus from [place] to [place] and living with "friends" after arrival              Anticipated d/c date is: > 3 days              Patient currently is not medically stable to d/c.  Barriers to discharge: Patient is homeless and currently has significant gait and ambulation issues secondary to recent bilateral amputation of toes.  This is further complicated by inadequate pain control.  And although patient has Medicaid he has a significant substance abuse history and therefore is unlikely to be accepted by nursing homes in the area.  As of 2/10 patient continues to have suboptimal blood pressure readings requiring adjustments in his cardiac medications.  Also he has been reevaluated by psychiatry and given his lack of insight into his current condition and inability to provide a safe discharge plan it is recommended patient stay in the hospital while he continues to recover.  Patient is also now reporting he prefers to stay in hospital longer as well given his difficulty performing basic tasks.  He continues to have issues with expressive aphasia and dysarthria therefore I have asked for speech therapy to come and reevaluate the patient   Difficult to place patient Yes   Level of care: Med-Surg  Code Status: Full Family Communication: Patient only DVT prophylaxis: Lovenox Vaccination status: Does not appear to be vaccinated against Covid  Foley catheter: No  HPI: 55 year old male with past medical history of coronary artery  disease, chronic systolic CHF, CVA presented with chest pain and became unresponsive with left-sided weakness and left gaze deviation.  Was intubated in the ER.  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  Extubated on 1/20 and transferred to [facility] service on 1/21.  Subjective: Awakened from sleep.  States is not pain-free but pain is adequately controlled.  Had questions about when the staples would come out of his toe wounds.  Discussed treatment for dystonic movements.  No other concerns or questions verbalized.  Objective: Vitals:   01/15/21 2042 01/16/21 0500  BP: (!) 140/93 (!) 146/93  Pulse: 63 74  Resp: 18 17  Temp: 97.9 F (36.6 C)   SpO2: 100%    No intake or output data in the 24 hours ending 01/16/21 0817 Filed Weights   01/11/21 0352 01/12/21 0500 01/13/21 0317  Weight: 95.1 kg 88.3 kg 78.7 kg    Exam:  Constitutional: Alert, calm and no acute distress Respiratory: Lungs are clear, stable on room air Cardiovascular: Heart sounds S1-S2, no JVD, no peripheral edema, pulses regular Abdomen: Soft and nontender.  Normoactive bowel sounds.  Eating well.  LBM 2/14 Skin: Bilateral toe amputation clean with some minor blood ooze and intact staple without any dehiscence of wound or purulent drainage. Neurologic: CN 2-12 grossly intact. Sensation intact, Strength 4-5/5 x all 4 extremities.  Continues to have dystonic movements and some dysarthria but markedly  improved as compared 2/9 and suspect this is more related to cerebellar injury from recent stroke. Psychiatric: Awake and alert.  Oriented to name place year and situation at this time.   Assessment/Plan: Acute problems: Acute ischemic stroke-right superior and middle cerebellar peduncle -Reviewed CT head and MRI brain and MRA head and neck -EEG shows diffuse encephalopathy -Continue statin; Continue aspirin and Plavix for 3  weeks (LD due 2/11) and then aspirin alone as per neurology's recommendation -1/25: Due to persistent right-sided weakness repeated CT head, MRI and EEG with no acute findings. -A1c: 6.4%, LDL: 72, echo shows ejection fraction less than 20% with grade 2 diastolic dysfunction -Neurology signed off -PT/OT recommended SNF but patient prefers to discharge with friends -According to PT note from 2/7 patient remains impulsive with no awareness of safety/deficits.  He is a high fall risk.  Stating he wants to go home and is again refusing SNF.  Recommend rolling walker and wheelchair if he declines SNF.This has already been requested -Repeat MRI on 2/8 as below-no acute infarct-suspect dysarthria and dystonia secondary to side effects from Lyrica as described below  Bipolar disorder/homelessness: -Poor compliance.  Homelessness.   -Consulted psychiatry early during the hospitalization-evaluated and cleared him a danger to himself or to others.  Patient on 2/1 the patient did meet criteria for decision making to go home if desired to under AMA status Repeat evaluation on 2/14.  Patient haD improved insight but lacked capacity to make medical decisions.  He was able to state to the psychiatric provider that he agreed to continue to stay in the hospital "I need to do what is right". -Continue Depakote -No typical antipsychotics based on history of prolonged QTC and underlying LV dysfunction  Acute dysarthria with dystonia -MRI negative for acute changes -Symptoms resolved after discontinuation of Lyrica -Has mild residual dystonia likely related to recent cerebellar stroke -Typically antipsychotics are used to treat dystonia (ballisma) but unable to use 2/2 prolonged qtc-can use AEDs and pt currently on Keppra and Depakote to treat seizure d/o and Bipolar d/o.  Bilateral great toe ulcers/osteomyelitis noted in left/status post multiple toe amputations: -S/p bilateral great toe amputation on 2/2 via  podiatry -2/4 resumed DVT prophylaxis with Lovenox -Discussed with TOC pharmacist.  We will change OxyContin to long-acting morphine.  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  Hydralazine was also discontinued.  Continue Entresto; continue Aldactone -Strict I/O's and daily weight -Follow-up with cardiology outpatient-appreciate cardiology's recommendation  Suspected Seizures: -EEG from 1/17 w/ diffuse encephalopathy, nonspecific etiology.  EEG from 1/19 showed evidence of epileptogenicity arising from right more than left frontal region. -EEG on 1/26 shows no seizures or epileptiform discharges were seen throughout the recording. -Continue Keppra 500 mg twice daily as per neurology's recommendations  Coronary artery disease: -Cardiology recommended outpatient cath at least 1 month after recovery from stroke. -Continue aspirin and atorvastatin  Physical deconditioning/gait instability -Last PT evaluation  was on 2/3 prior to adjustments in pain medications.  Patient was having significant difficulty with mobility secondary to pain -He was also reporting dizziness over the weekend with standing but would not allow vital signs to be checked.  We will attempt to obtain orthostatic vital signs. -We will order rolling walker and  wheelchair noting patient is at high risk for falls although he is making progress needing only minimal assist to come to stand with rolling walker and minimal to mod assist x2 to stand step to the right with a rolling walker    Other problems: Acute metabolic/toxic encephalopathy: -Multifactorial in the setting of stroke, drug use, respiratory failure and sedation -Resolved -Continue folic acid, thiamine and multivitamin -Seen by psychiatry-appreciate help  Hypertension:  Blood pressure 140s/100s-likely secondary to pain with significant improvement in hypertension with improved pain control as above. -Monitor blood pressure closely  Acute transaminitis -LFTs as of 2/3 are within normal limits except for low albumin of 3.1 -Elevated LFTs likely related to shock liver phases of admission the mild elevation of total bilirubin to 1.7 therefore not consistent with acute liver failure  Oral pain w/ upper palate edema -Orthopantogram unremarkable -As of 2/10 is resolved  Cocaine use:  Counseled about cessation  Hyponatremia:  Resolved  Bipolar disorder/homelessness: -Poor compliance.  Homelessness.   Consulted psychiatry-evaluated and cleared him a danger to himself or to others. -Continue Depakote  Hyperlipidemia:  Continue statin.    Data Reviewed: Basic Metabolic Panel: No results for input(s): NA, K, CL, CO2, GLUCOSE, BUN, CREATININE, CALCIUM, MG, PHOS in the last 168 hours. Liver Function Tests: No results for input(s): AST, ALT, ALKPHOS, BILITOT, PROT, ALBUMIN in the last 168 hours. No results for input(s): LIPASE, AMYLASE in the last 168 hours. No results for input(s): AMMONIA in the last 168 hours. CBC: No results for input(s): WBC, NEUTROABS, HGB, HCT, MCV, PLT in the last 168 hours. Cardiac Enzymes: No results for input(s): CKTOTAL, CKMB, CKMBINDEX, TROPONINI in the last 168 hours. BNP (last 3 results) No results for input(s): BNP in the last 8760  hours.  ProBNP (last 3 results) No results for input(s): PROBNP in the last 8760 hours.  CBG: No results for input(s): GLUCAP in the last 168 hours.  No results found for this or any previous visit (from the past 240 hour(s)).   Studies: No results found.  Scheduled Meds: . (feeding supplement) PROSource Plus  30 mL Oral BID BM  . aspirin  81 mg Oral Daily  . atorvastatin  40 mg Oral Daily  . carvedilol  6.25 mg Oral BID WC  . ciprofloxacin  500 mg Oral BID  . divalproex  500 mg Oral Daily  . enoxaparin (LOVENOX) injection  40 mg Subcutaneous Q24H  . feeding supplement  237 mL Oral BID BM  . folic acid  1 mg Oral Daily  . isosorbide dinitrate  5 mg Oral TID  . levETIRAcetam  500 mg Oral BID  . magnesium oxide  400 mg Oral BID  . methocarbamol  500 mg Oral TID  . morphine  30 mg Oral Q12H  . multivitamin with minerals  1 tablet Oral Daily  . polyethylene glycol  17 g Per Tube Daily  . sacubitril-valsartan  1 tablet Oral BID  . silver sulfADIAZINE   Topical BID  . spironolactone  12.5 mg Oral Daily  . thiamine  100 mg Oral Daily   Continuous Infusions: . lactated ringers Stopped (01/02/21 1552)    Active Problems:  Ischemic stroke (HCC)   Basilar artery embolus   Acute on chronic combined systolic and diastolic CHF (congestive heart failure) (HCC)   Respiratory failure (HCC)   Ulcer of great toe, left, with necrosis of bone (HCC)   Ulcer of great toe, right, with necrosis of muscle (HCC)   Acute osteomyelitis of toe, left (HCC)   Osteomyelitis of great toe (HCC)   Shock liver   Acute metabolic encephalopathy   CAD (coronary artery disease)   HTN (hypertension)   Bipolar I disorder (HCC)   Cocaine abuse (HCC)   Homelessness   HLD (hyperlipidemia)   Dystonia   Dysarthria   Pain, dental   Consultants:  Neurology  Cardiology  Podiatry  Palliative  Procedures: Bilateral great toe amputation  Antibiotics: Anti-infectives (From admission, onward)    Start     Dose/Rate Route Frequency Ordered Stop   01/14/21 0000  ciprofloxacin (CIPRO) 500 MG tablet  Status:  Discontinued        500 mg Oral 2 times daily 01/14/21 1223 01/15/21    01/05/21 1145  ciprofloxacin (CIPRO) tablet 500 mg        500 mg Oral 2 times daily 01/05/21 1057 01/19/21 0759   01/02/21 1406  vancomycin (VANCOCIN) powder  Status:  Discontinued          As needed 01/02/21 1406 01/02/21 1428   12/17/20 1450  ceFAZolin (ANCEF) 2-4 GM/100ML-% IVPB       Note to Pharmacy: Cristy Friedlander   : cabinet override      12/17/20 1450 12/17/20 1911       Time spent: 20 minutes    Junious Silk ANP  [facility] 7 am - 330 pm/M-F for direct patient care and secure chat Please refer to Amion for contact info 30  days

## 2021-01-16 NOTE — Progress Notes (Signed)
Patient refusing midnight vital signs and wants to sleep. Has been educated on the reasoning for routine vitals.

## 2021-01-16 NOTE — Plan of Care (Signed)
°  Problem: Education: Goal: Knowledge of General Education information will improve Description: Including pain rating scale, medication(s)/side effects and non-pharmacologic comfort measures Outcome: Progressing   Problem: Health Behavior/Discharge Planning: Goal: Ability to manage health-related needs will improve Outcome: Progressing   Problem: Clinical Measurements: Goal: Ability to maintain clinical measurements within normal limits will improve Outcome: Progressing Goal: Will remain free from infection Outcome: Progressing Goal: Diagnostic test results will improve Outcome: Progressing Goal: Respiratory complications will improve Outcome: Progressing Goal: Cardiovascular complication will be avoided Outcome: Progressing   Problem: Activity: Goal: Risk for activity intolerance will decrease Outcome: Progressing   Problem: Nutrition: Goal: Adequate nutrition will be maintained Outcome: Progressing   Problem: Coping: Goal: Level of anxiety will decrease Outcome: Progressing   Problem: Elimination: Goal: Will not experience complications related to bowel motility Outcome: Progressing Goal: Will not experience complications related to urinary retention Outcome: Progressing   Problem: Pain Managment: Goal: General experience of comfort will improve Outcome: Progressing   Problem: Safety: Goal: Ability to remain free from injury will improve Outcome: Progressing   Problem: Skin Integrity: Goal: Risk for impaired skin integrity will decrease Outcome: Progressing   Problem: Activity: Goal: Ability to tolerate increased activity will improve Outcome: Progressing   Problem: Respiratory: Goal: Ability to maintain a clear airway and adequate ventilation will improve Outcome: Progressing   Problem: Role Relationship: Goal: Method of communication will improve Outcome: Progressing   Problem: Education: Goal: Knowledge of disease or condition will  improve Outcome: Progressing Goal: Knowledge of secondary prevention will improve Outcome: Progressing Goal: Knowledge of patient specific risk factors addressed and post discharge goals established will improve Outcome: Progressing Goal: Individualized Educational Video(s) Outcome: Progressing   Problem: Coping: Goal: Will verbalize positive feelings about self Outcome: Progressing Goal: Will identify appropriate support needs Outcome: Progressing   Problem: Health Behavior/Discharge Planning: Goal: Ability to manage health-related needs will improve Outcome: Progressing   Problem: Self-Care: Goal: Ability to participate in self-care as condition permits will improve Outcome: Progressing Goal: Verbalization of feelings and concerns over difficulty with self-care will improve Outcome: Progressing Goal: Ability to communicate needs accurately will improve Outcome: Progressing   Problem: Nutrition: Goal: Risk of aspiration will decrease Outcome: Progressing Goal: Dietary intake will improve Outcome: Progressing   Problem: Education: Goal: Individualized Educational Video(s) Outcome: Progressing   Problem: Ischemic Stroke/TIA Tissue Perfusion: Goal: Complications of ischemic stroke/TIA will be minimized Outcome: Progressing   Problem: Education: Goal: Knowledge of secondary prevention will improve Outcome: Progressing

## 2021-01-16 NOTE — Telephone Encounter (Signed)
Patient is currently in the hospital and will call back to reschedule POV once discharged, Please Advise

## 2021-01-17 DIAGNOSIS — R471 Dysarthria and anarthria: Secondary | ICD-10-CM | POA: Diagnosis not present

## 2021-01-17 DIAGNOSIS — I639 Cerebral infarction, unspecified: Secondary | ICD-10-CM | POA: Diagnosis not present

## 2021-01-17 DIAGNOSIS — M86172 Other acute osteomyelitis, left ankle and foot: Secondary | ICD-10-CM | POA: Diagnosis not present

## 2021-01-17 DIAGNOSIS — G249 Dystonia, unspecified: Secondary | ICD-10-CM | POA: Diagnosis not present

## 2021-01-17 NOTE — Progress Notes (Signed)
Dr spoke to him this am about the importance of walking in the hallway and getting up to get stronger.  He agreed to walk in the hallway but did refuse 2 times and finally agreed to walk and it walking at this time with therapist with tech walking behind him with a w/c.  He does appear to be doing well.  Did receive scheduled Robaxin and said that medicine really helps him.

## 2021-01-17 NOTE — Plan of Care (Signed)
  Problem: Education: Goal: Knowledge of General Education information will improve Description: Including pain rating scale, medication(s)/side effects and non-pharmacologic comfort measures Outcome: Progressing   Problem: Health Behavior/Discharge Planning: Goal: Ability to manage health-related needs will improve Outcome: Progressing   Problem: Clinical Measurements: Goal: Ability to maintain clinical measurements within normal limits will improve Outcome: Progressing Goal: Will remain free from infection Outcome: Progressing Goal: Diagnostic test results will improve Outcome: Progressing Goal: Respiratory complications will improve Outcome: Progressing Goal: Cardiovascular complication will be avoided Outcome: Progressing   Problem: Activity: Goal: Risk for activity intolerance will decrease Outcome: Progressing   Problem: Nutrition: Goal: Adequate nutrition will be maintained Outcome: Progressing   Problem: Coping: Goal: Level of anxiety will decrease Outcome: Progressing   Problem: Elimination: Goal: Will not experience complications related to bowel motility Outcome: Progressing Goal: Will not experience complications related to urinary retention Outcome: Progressing   Problem: Pain Managment: Goal: General experience of comfort will improve Outcome: Progressing   Problem: Safety: Goal: Ability to remain free from injury will improve Outcome: Progressing   Problem: Skin Integrity: Goal: Risk for impaired skin integrity will decrease Outcome: Progressing   Problem: Activity: Goal: Ability to tolerate increased activity will improve Outcome: Progressing   Problem: Respiratory: Goal: Ability to maintain a clear airway and adequate ventilation will improve Outcome: Progressing   Problem: Role Relationship: Goal: Method of communication will improve Outcome: Progressing   Problem: Education: Goal: Knowledge of disease or condition will  improve Outcome: Progressing Goal: Knowledge of secondary prevention will improve Outcome: Progressing Goal: Knowledge of patient specific risk factors addressed and post discharge goals established will improve Outcome: Progressing Goal: Individualized Educational Video(s) Outcome: Progressing   Problem: Coping: Goal: Will verbalize positive feelings about self Outcome: Progressing Goal: Will identify appropriate support needs Outcome: Progressing   Problem: Health Behavior/Discharge Planning: Goal: Ability to manage health-related needs will improve Outcome: Progressing   Problem: Self-Care: Goal: Ability to participate in self-care as condition permits will improve Outcome: Progressing Goal: Verbalization of feelings and concerns over difficulty with self-care will improve Outcome: Progressing Goal: Ability to communicate needs accurately will improve Outcome: Progressing   Problem: Nutrition: Goal: Risk of aspiration will decrease Outcome: Progressing Goal: Dietary intake will improve Outcome: Progressing   Problem: Education: Goal: Individualized Educational Video(s) Outcome: Progressing   Problem: Ischemic Stroke/TIA Tissue Perfusion: Goal: Complications of ischemic stroke/TIA will be minimized Outcome: Progressing   Problem: Education: Goal: Knowledge of secondary prevention will improve Outcome: Progressing

## 2021-01-17 NOTE — Progress Notes (Signed)
[facility] PROGRESS NOTE  [name] KGM:[id number] DOB: 1966-01-22 DOA: 12/17/2020 PCP: [name]    01/16/2021   Status: Remains inpatient appropriate because:Ongoing active pain requiring inpatient pain management, Unsafe d/c plan and IV treatments appropriate due to intensity of illness or inability to take PO   Dispo:  The patient is from: homeless              Anticipated d/c is to: homeless shelter or streets-patient plans on taking a bus from [place] to [place] and living with "friends" after arrival              Anticipated d/c date is: > 3 days              Patient currently is not medically stable to d/c.  Barriers to discharge: Patient is homeless and currently has significant gait and ambulation issues secondary to recent bilateral amputation of toes.  Because of his polysubstance abuse he has yet to receive an SNF bed offer.  Patient at this juncture is concerned over his ongoing dysarthria and gait disturbance/high risk for falls and agrees this would be a safety liability for him if discharged too quickly back to the streets.   Difficult to place patient Yes   Level of care: Med-Surg  Code Status: Full Family Communication: Patient only DVT prophylaxis: Lovenox Vaccination status: Does not appear to be vaccinated against Covid  Foley catheter: No  HPI: 55 year old male with past medical history of coronary artery disease, chronic systolic CHF, CVA presented with chest pain and became unresponsive with left-sided weakness and left gaze deviation.  Was intubated in the ER.  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  Extubated on 1/20 and transferred to [facility] service on 1/21.  Subjective: Patient sitting on side of the bed.  Apparently earlier today became extremely anxious and upset because a wheelchair was delivered to his room and he thought  it meant that he was being discharged.  Patient reassured that given his speech difficulties and gait imbalance issues not only from stroke but because of bilateral great toe amputations that he would be kept in the hospital to continue aggressive therapies noting it would not be safe to discharge him back to the streets in that condition.  Also reassured patient regarding getting up to walk and or mobilize in wheelchair.  Order placed that patient must be ambulated in halls 3 times per day.  Currently reporting adequate pain control.  He continues to be very frustrated over his significant dysarthria and word finding difficulties.  Reassured him that speech therapy has been consulted.  Objective: Vitals:   01/17/21 0330 01/17/21 0700  BP: 119/79 (!) 151/81  Pulse: 69 74  Resp: 18 18  Temp: 98.3 F (36.8 C) 97.7 F (36.5 C)  SpO2: 98%     Intake/Output Summary (Last 24 hours) at 01/17/2021 0755 Last data filed at 01/17/2021 0017 Gross per 24 hour  Intake 840 ml  Output 1100 ml  Net -260 ml   Filed Weights   01/12/21 0500 01/13/21 0317 01/17/21 0330  Weight: 88.3 kg 78.7 kg 81 kg    Exam:  Constitutional: Awake and alert.  Somewhat anxious until reassured that he is not ready for discharge Respiratory: Heart lung sounds are clear to auscultation and he is stable on room air Cardiovascular: S1-S2, pulse regular nontachycardic, no peripheral edema. Abdomen: Soft nontender nondistended.  Eating well.  LBM 2/14 Skin: Bilateral toe amputation clean with some minor blood ooze and intact staple line Neurologic: CN 2-12 grossly intact except for persistent dysarthria and word finding difficulty. Sensation intact, Strength 4-5/5 x all 4 extremities.  Continues to have dystonic movements and some dysarthria but markedly improved as compared 2/9 and suspect this is more related to cerebellar injury from recent stroke. Psychiatric: Awake and alert.  Very animated secondary to having difficulty  with verbal communication and becoming very frustrated.  Oriented x3.   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 -A1c: 6.4%, LDL: 72, echo shows ejection fraction less than 20% with grade 2 diastolic dysfunction -Neurology signed off -PT/OT recommended SNF but with patient's polysubstance abuse history he has not received any bed offers -According to PT note high fall risk and patient now agrees.  At this point he is frightened about being discharged too soon.  Became upset and agitated when wheelchair was delivered to the room thinking that this was an indication he was ready for discharge.  Have reassured patient that we will keep him in the hospital as long as it takes to improve his physical abilities as well as his speech noting he will likely be discharged back to the streets and these issues would definitely impede his safety on the streets. -Repeat MRI on 2/8 as below-no acute infarct-suspect dysarthria and dystonia secondary to side effects from Lyrica as described below -2/17 reconsult SLP in regards to cognition and speech with ongoing dysarthria  Bipolar disorder/homelessness: -Poor compliance.  Homelessness.   -Consulted psychiatry early during the hospitalization-evaluated and cleared him a danger to himself or to others.  Patient on 2/1 the patient did meet criteria for decision making to go home if desired to under AMA status Repeat evaluation on 2/14.  Patient had improved insight but lacked capacity to make medical decisions.  He was able to state to the psychiatric provider that he agreed to continue to stay  in the hospital "I need to do what is right". -Continue Depakote -No typical antipsychotics based on history of prolonged QTC and underlying LV dysfunction  Acute dysarthria with dystonia -MRI negative for acute changes -Symptoms resolved after discontinuation of Lyrica -Has mild residual dystonia likely related to recent cerebellar stroke -Typically antipsychotics are used to treat dystonia (ballisma) but unable to use 2/2 prolonged qtc-can use AEDs and pt currently on Keppra and Depakote to treat seizure d/o and Bipolar d/o.  Bilateral great toe ulcers/osteomyelitis noted in left/status post multiple toe amputations: -S/p bilateral great toe amputation on 2/2 via podiatry -2/4 resumed DVT prophylaxis with Lovenox -Discussed with TOC pharmacist changed OxyContin to long-acting morphine.  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  Hydralazine was also discontinued.  Continue Entresto; continue Aldactone -Strict I/O's and daily weight -Follow-up with cardiology outpatient-appreciate cardiology's recommendation  Suspected Seizures: -EEG from 1/17 w/ diffuse encephalopathy, nonspecific etiology.  EEG from 1/19 showed evidence of epileptogenicity arising from right more than left frontal  region. -EEG on 1/26 shows no seizures or epileptiform discharges were seen throughout the recording. -Continue Keppra 500 mg twice daily as per neurology's recommendations  Coronary artery disease: -Cardiology recommended outpatient cath at least 1 month after recovery from stroke. -Continue aspirin and atorvastatin  Physical deconditioning/gait instability -Last PT evaluation was on 2/3 prior to adjustments in pain medications.  Patient was having significant difficulty with mobility secondary to pain has now improved with adjustments in narcotics -We will order rolling walker and wheelchair noting patient is at high risk for falls although he is making progress needing only minimal assist to come to stand with rolling walker and minimal to mod assist x2 to stand step to the right with a rolling walker -Plan is to remain inpatient until gait and ambulation maximized    Other problems: Acute metabolic/toxic encephalopathy: -Multifactorial in the setting of stroke, drug use, respiratory failure and sedation -Resolved -Continue folic acid, thiamine and multivitamin -Seen by psychiatry-appreciate help  Hypertension:  Blood pressure 140s/100s-likely secondary to pain with significant improvement in hypertension with improved pain control as above. -Monitor blood pressure closely  Acute transaminitis -LFTs as of 2/3 are within normal limits except for low albumin of 3.1 -Elevated LFTs likely related to shock liver phases of admission the mild elevation of total bilirubin to 1.7 therefore not consistent with acute liver failure  Oral pain w/ upper palate edema -Orthopantogram unremarkable -As of 2/10 is resolved  Cocaine use:  Counseled about cessation  Hyponatremia:  Resolved  Bipolar disorder/homelessness: -Poor compliance.  Homelessness.   Consulted psychiatry-evaluated and cleared him a danger to himself or to others. -Continue Depakote  Hyperlipidemia:  Continue  statin.    Data Reviewed: Basic Metabolic Panel: No results for input(s): NA, K, CL, CO2, GLUCOSE, BUN, CREATININE, CALCIUM, MG, PHOS in the last 168 hours. Liver Function Tests: No results for input(s): AST, ALT, ALKPHOS, BILITOT, PROT, ALBUMIN in the last 168 hours. No results for input(s): LIPASE, AMYLASE in the last 168 hours. No results for input(s): AMMONIA in the last 168 hours. CBC: No results for input(s): WBC, NEUTROABS, HGB, HCT, MCV, PLT in the last 168 hours. Cardiac Enzymes: No results for input(s): CKTOTAL, CKMB, CKMBINDEX, TROPONINI in the last 168 hours. BNP (last 3 results) No results for input(s): BNP in the last 8760 hours.  ProBNP (last 3 results) No results for input(s): PROBNP in the last 8760 hours.  CBG: No results for input(s): GLUCAP in the last 168 hours.  No results found for this or any previous visit (from the past 240 hour(s)).   Studies: No results found.  Scheduled Meds: . (feeding supplement) PROSource Plus  30 mL Oral BID BM  . aspirin  81 mg Oral Daily  . atorvastatin  40 mg Oral Daily  . carvedilol  6.25 mg Oral BID WC  . ciprofloxacin  500 mg Oral BID  . divalproex  500 mg Oral Daily  . enoxaparin (LOVENOX) injection  40 mg Subcutaneous Q24H  . feeding supplement  237 mL Oral BID BM  . folic acid  1  mg Oral Daily  . isosorbide dinitrate  5 mg Oral TID  . levETIRAcetam  500 mg Oral BID  . magnesium oxide  400 mg Oral BID  . methocarbamol  500 mg Oral TID  . morphine  30 mg Oral Q12H  . multivitamin with minerals  1 tablet Oral Daily  . polyethylene glycol  17 g Per Tube Daily  . sacubitril-valsartan  1 tablet Oral BID  . silver sulfADIAZINE   Topical BID  . spironolactone  12.5 mg Oral Daily  . thiamine  100 mg Oral Daily   Continuous Infusions: . lactated ringers Stopped (01/02/21 1552)    Active Problems:   Ischemic stroke (HCC)   Basilar artery embolus   Acute on chronic combined systolic and diastolic CHF (congestive  heart failure) (HCC)   Respiratory failure (HCC)   Ulcer of great toe, left, with necrosis of bone (HCC)   Ulcer of great toe, right, with necrosis of muscle (HCC)   Acute osteomyelitis of toe, left (HCC)   Osteomyelitis of great toe (HCC)   Shock liver   Acute metabolic encephalopathy   CAD (coronary artery disease)   HTN (hypertension)   Bipolar I disorder (HCC)   Cocaine abuse (HCC)   Homelessness   HLD (hyperlipidemia)   Dystonia   Dysarthria   Pain, dental   Consultants:  Neurology  Cardiology  Podiatry  Palliative  Procedures: Bilateral great toe amputation  Antibiotics: Anti-infectives (From admission, onward)   Start     Dose/Rate Route Frequency Ordered Stop   01/14/21 0000  ciprofloxacin (CIPRO) 500 MG tablet  Status:  Discontinued        500 mg Oral 2 times daily 01/14/21 1223 01/15/21    01/05/21 1145  ciprofloxacin (CIPRO) tablet 500 mg        500 mg Oral 2 times daily 01/05/21 1057 01/19/21 0759   01/02/21 1406  vancomycin (VANCOCIN) powder  Status:  Discontinued          As needed 01/02/21 1406 01/02/21 1428   12/17/20 1450  ceFAZolin (ANCEF) 2-4 GM/100ML-% IVPB       Note to Pharmacy: Cristy Friedlander   : cabinet override      12/17/20 1450 12/17/20 1911       Time spent: 20 minutes    Junious Silk ANP  [facility] 7 am - 330 pm/M-F for direct patient care and secure chat Please refer to Amion for contact info 31  days

## 2021-01-17 NOTE — Progress Notes (Signed)
Patient status post bilateral toe amputation surgery.  Spoke with patient this evening about removing the skin staples from the surgical sites.  Patient requested that we do not remove them tonight.  He would prefer that I come tomorrow, 01/18/2021 to remove the skin staples.  Will return tomorrow evening to remove skin staples from the bilateral feet  Felecia Shelling, DPM [facility]  Dr. Felecia Shelling, DPM    [address] [place], [name] [address]                Office [phone]  Fax [phone]

## 2021-01-17 NOTE — Progress Notes (Signed)
Nutrition Follow-up  DOCUMENTATION CODES:   Not applicable  INTERVENTION:   Continue Ensure Enlive po BID, each supplement provides 350 kcal and 20 grams of protein  Continue 30 ml ProSource Plus BID, each supplement provides 100 kcals and 15 grams protein.   Continue MVI with [place]   NUTRITION DIAGNOSIS:   Inadequate oral intake related to inability to eat as evidenced by NPO status.  Being addressed via supplements  GOAL:   Patient will meet greater than or equal to 90% of their needs  Progressing  MONITOR:   PO intake,Supplement acceptance,Weight trends,I & O's  REASON FOR ASSESSMENT:   Consult,Ventilator Enteral/tube feeding initiation and management  ASSESSMENT:   55 year old male admitted with acute ischemic nonhemorrhagic right superior cerebellar artery territory infarct. PMH of polysubstance abuse, CHF.   1/20 - extubated, diet advanced to regular with thin liquids 1/21 transferred to floor 2/2- S/p bilateral great toe amputation   Pt is homeless with discharge to homeless shelter/streets; not ready for d/c Pt continues with significant dysarthria, SLP re-consulted today  Recent recorded po intake 75-100% of meals  Labs: reviewed Meds: MVI with Minerals, mag ox, thiamine   Diet Order:   Diet Order            Diet 2 gram sodium Room service appropriate? Yes; Fluid consistency: Thin  Diet effective now                 EDUCATION NEEDS:   No education needs have been identified at this time  Skin:  Skin Assessment: Skin Integrity Issues: Skin Integrity Issues:: Incisions Incisions: foot  Last BM:  2/14  Height:   Ht Readings from Last 1 Encounters:  01/02/21 5\' 9"  (1.753 m)    Weight:   Wt Readings from Last 1 Encounters:  01/17/21 81 kg    Ideal Body Weight:     BMI:  Body mass index is 26.37 kg/m.  Estimated Nutritional Needs:   Kcal:  2200-2400  Protein:  120-140 grams  Fluid:  >2 L/day    01/19/21  MS, RDN, LDN, CNSC Registered Dietitian III Clinical Nutrition RD Pager and On-Call Pager Number Located in [place]

## 2021-01-17 NOTE — Telephone Encounter (Signed)
Aware Dr. Logan Bores will see him today to eval for staple removal

## 2021-01-17 NOTE — Evaluation (Signed)
Speech Language Pathology Evaluation Patient Details Name: [name] MRN: [id number] DOB: [birth year] Today's Date: 01/17/2021 Time: 7824-2353 SLP Time Calculation (min) (ACUTE ONLY): 25 min  Problem List:  Patient Active Problem List   Diagnosis Date Noted  . Pain, dental   . Dystonia   . Dysarthria   . Shock liver 01/04/2021  . Acute metabolic encephalopathy 01/04/2021  . CAD (coronary artery disease) 01/04/2021  . HTN (hypertension) 01/04/2021  . Bipolar I disorder (HCC) 01/04/2021  . Cocaine abuse (HCC) 01/04/2021  . Homelessness 01/04/2021  . HLD (hyperlipidemia) 01/04/2021  . Osteomyelitis of great toe (HCC)   . Ulcer of great toe, left, with necrosis of bone (HCC)   . Ulcer of great toe, right, with necrosis of muscle (HCC)   . Acute osteomyelitis of toe, left (HCC)   . Acute on chronic combined systolic and diastolic CHF (congestive heart failure) (HCC) 12/20/2020  . Respiratory failure (HCC)   . Ischemic stroke (HCC) 12/17/2020  . 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 No pertinent past medical history. Past Surgical History:  Past Surgical History:  Procedure Laterality Date  . 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 IR ANGIO INTRA EXTRACRAN SEL COM CAROTID INNOMINATE BILAT MOD SED  12/17/2020  . IR ANGIO VERTEBRAL SEL VERTEBRAL BILAT MOD SED  12/17/2020  . RADIOLOGY WITH ANESTHESIA N/A 12/17/2020   Procedure: IR WITH ANESTHESIA;  Surgeon: Radiologist, Medication, MD;  Location: [facility] OR;  Service: Radiology;  Laterality: N/A;   HPI:  55 yr old [place] and became unresponsive with left-sided weakness and left gaze deviation. CT head which showed subacute to chronic left cerebellar infarct, CT angiogram head and neck was  suboptimal, patient underwent cerebral angiogram which showed right superior cerebellar artery cutoff, no intervention. Intubated 1/17-1/20. Pt s/p bilateral big toe amputations.   Assessment / Plan / Recommendation Clinical Impression  Pt reassessed for cognitive linguistic challenges post CVA. Prior intervention during this admission was impacted by reduced pt participation. Pt notably more agreeable during interaction this date with SLP. Cognitive deficits appreciated include reduced recall, poor executive function skills, reduced safety awareness, decreased problem solving, and reduced thought organization. Dysarthria of speech noted however appeared to fluctuate on abilities, pt reported "it comes and goes". Reduced articulation of sounds and decreased vocal intensity noted at times. Pt appeared to have some word finding difficulties which was not noted during prior ST evaluation and resulted in dysfluencies of speech with frequent pausing. Repetition however was intact. SLP to follow up to assist cognitive linguistic deficits during acute stay.    SLP Assessment  SLP Recommendation/Assessment: Patient needs continued Speech Lanaguage Pathology Services SLP Visit Diagnosis: Cognitive communication deficit (R41.841);Frontal lobe and executive function deficit Frontal lobe and executive function deficit following: Cerebral infarction    Follow Up Recommendations  Skilled Nursing facility    Frequency and Duration min 1 x/week  1 week      SLP Evaluation Cognition  Overall Cognitive Status: Impaired/Different from baseline Arousal/Alertness: Awake/alert Orientation Level: Oriented to person;Oriented to place;Disoriented to time Attention: Sustained Sustained Attention: Appears intact Memory: Impaired Memory Impairment: Decreased short term memory;Decreased recall of new information Awareness: Impaired Awareness Impairment: Emergent impairment Problem Solving: Impaired Executive  Function: Organizing;Self Monitoring Organizing: Impaired Self Monitoring: Impaired  Behaviors: Verbal agitation Safety/Judgment: Impaired       Comprehension  Auditory Comprehension Overall Auditory Comprehension: Impaired Commands: Impaired One Step Basic Commands: 50-74% accurate EffectiveTechniques: Extra processing time;Increased volume;Pausing;Repetition    Expression Expression Primary Mode of Expression: Verbal Verbal Expression Overall Verbal Expression: Impaired Initiation: Impaired Repetition: No impairment Naming: Impairment Verbal Errors: Aware of errors Interfering Components: Attention Written Expression Dominant Hand: Right   Oral / Motor  Oral Motor/Sensory Function Overall Oral Motor/Sensory Function: Mild impairment Facial ROM: Reduced right;Suspected CN VII (facial) dysfunction Facial Symmetry: Abnormal symmetry right;Suspected CN VII (facial) dysfunction Facial Strength: Reduced right;Suspected CN VII (facial) dysfunction Lingual Strength: Within Functional Limits Motor Speech Overall Motor Speech: Impaired Respiration: Impaired Level of Impairment: Phrase Phonation: Low vocal intensity;Other (comment) Resonance: Within functional limits Articulation: Impaired Level of Impairment: Phrase Intelligibility: Intelligibility reduced Word: 50-74% accurate Phrase: 50-74% accurate Motor Planning: Witnin functional limits Effective Techniques: Increased vocal intensity;Slow rate;Over-articulate   GO                    Ardyth Gal MA, CCC-SLP Acute Rehabilitation Services   01/17/2021, 2:48 PM

## 2021-01-17 NOTE — Progress Notes (Signed)
Occupational Therapy Treatment Patient Details Name: [name] MRN: [id number] DOB: July 07, 1966 Today's Date: 01/17/2021    History of present illness Homeless 55 yo male found down with c/o CP by bystanders. 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 Extubated 1/20. S/p bil great toe amputations 01/02/21.   OT comments  Pt progressing towards established OT goals. Continues to present with decreased cognition, balance, coordination, and strength as well as ataxia at RUE/RLE. Pt benefiting from calm environment with simples cues. Max A for donning post-op shoes. Pt slightly restless upon arrival and becoming more worked up with topic of mobility; however, agreeable to functional mobility in hallway. Pt requiring Min-Mod A +2 and RW for performing functional mobility; 81ft x2 with seated rest break. As seen progressed, pt seeming to become more comfortable. Continue to recommend dc to SNF and will continue to follow acutely as admitted.    Follow Up Recommendations  SNF;Supervision/Assistance - 24 hour    Equipment Recommendations  3 in 1 bedside commode;Wheelchair (measurements OT);Wheelchair cushion (measurements OT)    Recommendations for Other Services      Precautions / Restrictions Precautions Precautions: Fall Precaution Comments: orthostatic hypotension; pt easily agitated due to psych h/o, is very unsafe; s/p bil great toe amputation 01/02/21 Restrictions Other Position/Activity Restrictions: Post-op shoes present in room       Mobility Bed Mobility Overal bed mobility: Needs Assistance Bed Mobility: Supine to Sit     Supine to sit: Supervision     General bed mobility comments: Bed flat, cuing pt to come sit L EOB. Supervision and extra time for all for safety.  Transfers Overall  transfer level: Needs assistance Equipment used: Rolling walker (2 wheeled) Transfers: Sit to/from Stand Sit to Stand: Min assist         General transfer comment: Min A to power up into standing    Balance Overall balance assessment: Needs assistance Sitting-balance support: Feet supported;No upper extremity supported Sitting balance-Leahy Scale: Fair Sitting balance - Comments: Static sitting EOB   Standing balance support: Bilateral upper extremity supported;During functional activity Standing balance-Leahy Scale: Poor Standing balance comment: Reliant on UE support on RW and external assist.                           ADL either performed or assessed with clinical judgement   ADL Overall ADL's : Needs assistance/impaired                       Lower Body Dressing Details (indicate cue type and reason): Donning post op shoes in prpearation for mobility as pt becoming more nervous about walking in hallway Toilet Transfer: Minimal assistance;RW (simulated at recliner) Toilet Transfer Details (indicate cue type and reason): Min A to power up into standing         Functional mobility during ADLs: Minimal assistance;Moderate assistance;+2 for safety/equipment;Rolling walker General ADL Comments: Pt agreeable to walk in hallway (as MD requested he perform this task today). Pt performing ~50feet total of mobility with Min-Mod A and RW. Needing seated rest break ~30 ft.     Vision       Perception     Praxis      Cognition Arousal/Alertness: Awake/alert Behavior During Therapy: Restless;Impulsive Overall Cognitive Status: Impaired/Different from baseline Area of  Impairment: Attention;Memory;Following commands;Safety/judgement;Awareness;Problem solving                   Current Attention Level: Sustained (Easily distracted by external stimuli) Memory: Decreased short-term memory;Decreased recall of precautions Following Commands: Follows one  step commands with increased time;Follows one step commands inconsistently Safety/Judgement: Decreased awareness of safety;Decreased awareness of deficits Awareness: Intellectual Problem Solving: Slow processing;Decreased initiation;Requires verbal cues;Difficulty sequencing;Requires tactile cues General Comments: Pt continues to present with restless behaviors such as frequent changing of positions, tapping LEs, and donning/doffing mask. Pt easily distracted by external stimuli. Requiring significant time throughout for processing and following commands and to verbalize thoughts. As session progressed, pt becoming less restless and seeming more comfortable with therapist and situation.        Exercises     Shoulder Instructions       General Comments      Pertinent Vitals/ Pain       Pain Assessment: Faces Pain Score: 0-No pain Faces Pain Scale: Hurts even more Pain Location: Feet Pain Descriptors / Indicators: Sore;Operative site guarding;Grimacing Pain Intervention(s): Limited activity within patient's tolerance;Monitored during session;Repositioned  Home Living       Type of Home: Homeless                                  Prior Functioning/Environment              Frequency  Min 2X/week        Progress Toward Goals  OT Goals(current goals can now be found in the care plan section)  Progress towards OT goals: Progressing toward goals  Acute Rehab OT Goals Patient Stated Goal: to go to bathroom OT Goal Formulation: With patient Time For Goal Achievement: 01/31/21 Potential to Achieve Goals: Good ADL Goals Pt Will Perform Grooming: with min guard assist;sitting;standing Pt Will Perform Lower Body Bathing: with min guard assist;sit to/from stand Pt Will Perform Lower Body Dressing: with min guard assist;sit to/from stand Pt Will Transfer to Toilet: with min guard assist;ambulating;bedside commode Pt Will Perform Toileting - Clothing  Manipulation and hygiene: with min guard assist;sit to/from stand;sitting/lateral leans  Plan Discharge plan remains appropriate    Co-evaluation                 AM-PAC OT "6 Clicks" Daily Activity     Outcome Measure   Help from another person eating meals?: A Little Help from another person taking care of personal grooming?: A Lot Help from another person toileting, which includes using toliet, bedpan, or urinal?: A Lot Help from another person bathing (including washing, rinsing, drying)?: A Lot Help from another person to put on and taking off regular upper body clothing?: A Little Help from another person to put on and taking off regular lower body clothing?: A Lot 6 Click Score: 14    End of Session Equipment Utilized During Treatment: Gait belt;Rolling walker;Other (comment) (bil post op shoes)  OT Visit Diagnosis: Unsteadiness on feet (R26.81);Other abnormalities of gait and mobility (R26.89);Muscle weakness (generalized) (M62.81);Other symptoms and signs involving cognitive function;Hemiplegia and hemiparesis Hemiplegia - Right/Left: Right Hemiplegia - dominant/non-dominant: Dominant Hemiplegia - caused by: Cerebral infarction   Activity Tolerance Patient tolerated treatment well   Patient Left in bed;with call bell/phone within reach;with bed alarm set   Nurse Communication Mobility status;Other (comment) (Would like to call his friend)        Time: 1550-1630 OT Time  Calculation (min): 40 min  Charges: OT General Charges $OT Visit: 1 Visit OT Treatments $Self Care/Home Management : 8-22 mins $Therapeutic Activity: 23-37 mins  Stephanos Fan MSOT, OTR/L Acute Rehab Pager: [phone] Office: [phone]   Theodoro Grist Chrystian Ressler 01/17/2021, 4:54 PM

## 2021-01-18 ENCOUNTER — Encounter: Payer: Medicaid Other | Admitting: Podiatry

## 2021-01-18 DIAGNOSIS — R471 Dysarthria and anarthria: Secondary | ICD-10-CM | POA: Diagnosis not present

## 2021-01-18 DIAGNOSIS — F319 Bipolar disorder, unspecified: Secondary | ICD-10-CM | POA: Diagnosis not present

## 2021-01-18 DIAGNOSIS — G249 Dystonia, unspecified: Secondary | ICD-10-CM | POA: Diagnosis not present

## 2021-01-18 DIAGNOSIS — I639 Cerebral infarction, unspecified: Secondary | ICD-10-CM | POA: Diagnosis not present

## 2021-01-18 NOTE — Progress Notes (Signed)
Patient received his wheelchair and rolator from [employer] - he is agreeable to begin learning how to use it with the assistance of physical therapy.  Edwin Dada, MSW, [name] Transitions of Care  Clinical Social Worker II [phone]

## 2021-01-18 NOTE — Progress Notes (Signed)
Patient status post bilateral great toe amputation surgery.  DOS: 01/02/2021 by Dr. Ventura Sellers, DPM.  Patient resting comfortably in bed this evening.  Presenting for staple and suture removal to the bilateral great toes.  Patient initially was resistant however he complied and allowed me to remove the staples and sutures from the great toe amputation sites.  Assessment: S/P bilateral great toe amputations.  DOS: 01/02/2021.  Plan of care: -Staples and sutures were removed today. -Continue Silvadene cream along the incision sites with a light dressing daily.  Reinforce with either Medipore tape or Kerlix around the ankle to ensure that the wrap does not come off -Follow-up in 1 week post discharge in office with Dr. Samuella Cota, surgeon. -Podiatry to sign off  Felecia Shelling, DPM [facility]  Dr. Felecia Shelling, DPM    [address] [place], [name] [address]                Office [phone]  Fax [phone]

## 2021-01-18 NOTE — Progress Notes (Signed)
[facility] PROGRESS NOTE  [name] NWG:[id number] DOB: Mar 12, 1966 DOA: 12/17/2020 PCP: [name]    01/16/2021   Status: Remains inpatient appropriate because:Ongoing active pain requiring inpatient pain management, Unsafe d/c plan and IV treatments appropriate due to intensity of illness or inability to take PO   Dispo:  The patient is from: homeless              Anticipated d/c is to: homeless shelter or streets-patient plans on taking a bus from [place] to [place] and living with "friends" after arrival              Anticipated d/c date is: > 3 days              Patient currently is not medically stable to d/c.  Barriers to discharge: Patient is homeless and currently has significant gait and ambulation issues secondary to recent bilateral amputation of toes.  Because of his polysubstance abuse he has yet to receive an SNF bed offer.  Patient at this juncture is concerned over his ongoing dysarthria and gait disturbance/high risk for falls and agrees this would be a safety liability for him if discharged too quickly back to the streets.   Difficult to place patient Yes   Level of care: Med-Surg  Code Status: Full Family Communication: Patient only DVT prophylaxis: Lovenox Vaccination status: Does not appear to be vaccinated against Covid  Foley catheter: No  HPI: 55 year old male with past medical history of coronary artery disease, chronic systolic CHF, CVA presented with chest pain and became unresponsive with left-sided weakness and left gaze deviation.  Was intubated in the ER.  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  Extubated on 1/20 and transferred to [facility] service on 1/21.  Subjective: Awake.  Multiple staff members in room and patient able to interact.  Still has some difficulty communicating relating to his dysarthria.  No complaints.  Very  excited about recent increase in mobility.  Discussed with patient that recovery will take quite some time and he may need to temporarily reside in a nursing facility (if accepted) to ensure maximum progress and safety prior to discharge back to the streets.  Patient seemed to agree.  In addition he was talking sports while we were in the room.  Objective: Vitals:   01/17/21 2331 01/18/21 0700  BP: 110/75 137/84  Pulse: 69 61  Resp: 18 18  Temp:  (!) 97.4 F (36.3 C)  SpO2: 100% 100%   No intake or output data in the 24 hours ending 01/18/21 0802 Filed Weights   01/12/21 0500 01/13/21 0317 01/17/21 0330  Weight: 88.3 kg 78.7 kg 81 kg    Exam:  Constitutional: Awake and alert and in no acute distress Respiratory: Lung sounds are clear posteriorly, stable on room air Cardiovascular: Normal heart sounds, no JVD or peripheral edema, extremities warm to touch. Abdomen: Abdomen soft nontender nondistended and tolerating diet.  LBM 2/17 Skin: Bilateral toe amputation clean with some minor blood ooze and intact staple line Neurologic: CN 2-12 grossly intact except for persistent dysarthria and word finding difficulty. Sensation intact, Strength 4-5/5 x all 4 extremities.  Continues to have dystonic movements and dysarthria. Psychiatric: Alert and oriented x3, pleasant mood   Assessment/Plan: Acute problems: Acute ischemic stroke-right superior and middle cerebellar peduncle -Reviewed CT head and MRI brain and MRA head and neck -EEG shows diffuse encephalopathy -Continue statin; Continue aspirin  and Plavix for 3 weeks (LD due 2/11) and then aspirin alone as per neurology's recommendation -1/25: Due to persistent right-sided weakness repeated CT head, MRI and EEG with no acute findings. -A1c: 6.4%, LDL: 72, echo shows ejection fraction less than 20% with grade 2 diastolic dysfunction -Neurology signed off -PT/OT recommended SNF but with patient's polysubstance abuse history he has not  received any bed offers -According to PT note high fall risk and patient now agrees.  At this point he is frightened about being discharged too soon.  Became upset and agitated when wheelchair was delivered to the room thinking that this was an indication he was ready for discharge.  Have reassured patient that we will keep him in the hospital as long as it takes to improve his physical abilities as well as his speech noting he will likely be discharged back to the streets and these issues would definitely impede his safety on the streets. -Repeat MRI on 2/8 as below-no acute infarct-suspect dysarthria and dystonia secondary to side effects from Lyrica as described below -2/17 reconsult SLP in regards to cognition and speech with ongoing dysarthria  Bipolar disorder/homelessness: -Poor compliance.  Homelessness.   -Consulted psychiatry early during the hospitalization-evaluated and cleared him a danger to himself or to others.  Patient on 2/1 the patient did meet criteria for decision making to go home if desired to under AMA status Repeat evaluation on 2/14.  Patient had improved insight but lacked capacity to make medical decisions.  He was able to state to the psychiatric provider that he agreed to continue to stay in the hospital "I need to do what is right". -Continue Depakote -No typical antipsychotics based on history of prolonged QTC and underlying LV dysfunction  Acute dysarthria with dystonia -MRI negative for acute changes -Symptoms resolved after discontinuation of Lyrica -Has mild residual dystonia likely related to recent cerebellar stroke -Typically antipsychotics are used to treat dystonia (ballisma) but unable to use 2/2 prolonged qtc-can use AEDs and pt currently on Keppra and Depakote to treat seizure d/o and Bipolar d/o.  Bilateral great toe ulcers/osteomyelitis noted in left/status post multiple toe amputations: -S/p bilateral great toe amputation on 2/2 via podiatry -2/4  resumed DVT prophylaxis with Lovenox -Discussed with TOC pharmacist changed OxyContin to long-acting morphine.  After review it was determined patient would require preauthorization for this medication therefore will contact case management to assist in obtaining appropriate paperwork to be completed -Continue Cipro for a total of 2 weeks with last date 2/19 -2/8 given acute dysarthria and dystonia have opted to discontinue Lyrica for now since it appeared to make the symptoms worse -Podiatry to remove staples on 2/18 based on their note  Chronic combined systolic and diastolic heart failure with ejection fraction of less than 20% and grade 2 diastolic dysfunction: -Likely cocaine induced -Lasix discontinued on 2/10 secondary to suboptimal blood pressure readings  Continues with -4 L balance -Continue low-dose Coreg, on 2/4 given suboptimal blood pressure reading Imdur changed to shorter acting low-dose isosorbide.  Hydralazine was also discontinued.  Continue Entresto; continue Aldactone -Strict I/O's and daily weight -Follow-up with cardiology outpatient-appreciate cardiology's recommendation  Suspected Seizures: -EEG from 1/17 w/ diffuse encephalopathy, nonspecific etiology.  EEG from 1/19 showed evidence of epileptogenicity arising from right more than left frontal region. -EEG on 1/26 shows no seizures or epileptiform discharges were seen throughout the recording. -Continue Keppra 500 mg twice daily as per neurology's recommendations  Coronary artery disease: -Cardiology recommended outpatient cath at least  1 month after recovery from stroke. -Continue aspirin and atorvastatin  Physical deconditioning/gait instability -Last PT evaluation was on 2/3 prior to adjustments in pain medications.  Patient was having significant difficulty with mobility secondary to pain has now improved with adjustments in narcotics -We will order rolling walker and wheelchair noting patient is at high risk  for falls although he is making progress needing only minimal assist to come to stand with rolling walker and minimal to mod assist x2 to stand step to the right with a rolling walker -Plan is to remain inpatient until gait and ambulation maximized    Other problems: Acute metabolic/toxic encephalopathy: -Multifactorial in the setting of stroke, drug use, respiratory failure and sedation -Resolved -Continue folic acid, thiamine and multivitamin -Seen by psychiatry-appreciate help  Hypertension:  Blood pressure 140s/100s-likely secondary to pain with significant improvement in hypertension with improved pain control as above. -Monitor blood pressure closely  Acute transaminitis -LFTs as of 2/3 are within normal limits except for low albumin of 3.1 -Elevated LFTs likely related to shock liver phases of admission the mild elevation of total bilirubin to 1.7 therefore not consistent with acute liver failure  Oral pain w/ upper palate edema -Orthopantogram unremarkable -As of 2/10 is resolved  Cocaine use:  Counseled about cessation  Hyponatremia:  Resolved  Bipolar disorder/homelessness: -Poor compliance.  Homelessness.   Consulted psychiatry-evaluated and cleared him a danger to himself or to others. -Continue Depakote  Hyperlipidemia:  Continue statin.    Data Reviewed: Basic Metabolic Panel: No results for input(s): NA, K, CL, CO2, GLUCOSE, BUN, CREATININE, CALCIUM, MG, PHOS in the last 168 hours. Liver Function Tests: No results for input(s): AST, ALT, ALKPHOS, BILITOT, PROT, ALBUMIN in the last 168 hours. No results for input(s): LIPASE, AMYLASE in the last 168 hours. No results for input(s): AMMONIA in the last 168 hours. CBC: No results for input(s): WBC, NEUTROABS, HGB, HCT, MCV, PLT in the last 168 hours. Cardiac Enzymes: No results for input(s): CKTOTAL, CKMB, CKMBINDEX, TROPONINI in the last 168 hours. BNP (last 3 results) No results for input(s):  BNP in the last 8760 hours.  ProBNP (last 3 results) No results for input(s): PROBNP in the last 8760 hours.  CBG: No results for input(s): GLUCAP in the last 168 hours.  No results found for this or any previous visit (from the past 240 hour(s)).   Studies: No results found.  Scheduled Meds: . (feeding supplement) PROSource Plus  30 mL Oral BID BM  . aspirin  81 mg Oral Daily  . atorvastatin  40 mg Oral Daily  . carvedilol  6.25 mg Oral BID WC  . ciprofloxacin  500 mg Oral BID  . divalproex  500 mg Oral Daily  . enoxaparin (LOVENOX) injection  40 mg Subcutaneous Q24H  . feeding supplement  237 mL Oral BID BM  . folic acid  1 mg Oral Daily  . isosorbide dinitrate  5 mg Oral TID  . levETIRAcetam  500 mg Oral BID  . magnesium oxide  400 mg Oral BID  . methocarbamol  500 mg Oral TID  . morphine  30 mg Oral Q12H  . multivitamin with minerals  1 tablet Oral Daily  . polyethylene glycol  17 g Per Tube Daily  . sacubitril-valsartan  1 tablet Oral BID  . silver sulfADIAZINE   Topical BID  . spironolactone  12.5 mg Oral Daily  . thiamine  100 mg Oral Daily   Continuous Infusions: . lactated ringers Stopped (01/02/21 1552)  Active Problems:   Ischemic stroke (HCC)   Basilar artery embolus   Acute on chronic combined systolic and diastolic CHF (congestive heart failure) (HCC)   Respiratory failure (HCC)   Ulcer of great toe, left, with necrosis of bone (HCC)   Ulcer of great toe, right, with necrosis of muscle (HCC)   Acute osteomyelitis of toe, left (HCC)   Osteomyelitis of great toe (HCC)   Shock liver   Acute metabolic encephalopathy   CAD (coronary artery disease)   HTN (hypertension)   Bipolar I disorder (HCC)   Cocaine abuse (HCC)   Homelessness   HLD (hyperlipidemia)   Dystonia   Dysarthria   Pain, dental   Consultants:  Neurology  Cardiology  Podiatry  Palliative  Procedures: Bilateral great toe amputation  Antibiotics: Anti-infectives (From  admission, onward)   Start     Dose/Rate Route Frequency Ordered Stop   01/14/21 0000  ciprofloxacin (CIPRO) 500 MG tablet  Status:  Discontinued        500 mg Oral 2 times daily 01/14/21 1223 01/15/21    01/05/21 1145  ciprofloxacin (CIPRO) tablet 500 mg        500 mg Oral 2 times daily 01/05/21 1057 01/19/21 0759   01/02/21 1406  vancomycin (VANCOCIN) powder  Status:  Discontinued          As needed 01/02/21 1406 01/02/21 1428   12/17/20 1450  ceFAZolin (ANCEF) 2-4 GM/100ML-% IVPB       Note to Pharmacy: Cristy Friedlander   : cabinet override      12/17/20 1450 12/17/20 1911       Time spent: 20 minutes    Junious Silk ANP  [facility] 7 am - 330 pm/M-F for direct patient care and secure chat Please refer to Amion for contact info 32  days

## 2021-01-18 NOTE — Progress Notes (Signed)
Physical Therapy Treatment Patient Details Name: [name] MRN: [id number] DOB: 1965/12/24 Today's Date: 01/18/2021    History of Present Illness Homeless 55 yo male found down with c/o CP by bystanders. 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 Extubated 1/20. S/p bil great toe amputations 01/02/21.    PT Comments    Pt making progress with mobility, being more agreeable to advancing gait this date in hallways. Pt ambulated x2 bouts of ~45-55 ft each bout with a RW and w/c follow and minA for stability. He continues to display ataxic movements, poor attention span, decreased receptiveness to cues, and poor balance that place him at risk for falls and limit his progress. Pt was also able to propel his w/c ~30 ft in the hall slowly utilizing bil UEs and LEs. He tends to drift to the R due to R UE ataxia limiting his ability to grab the wheel and propel when desired. Will continue to follow acutely. Current recommendations remain appropriate.   Follow Up Recommendations  SNF;Supervision/Assistance - 24 hour     Equipment Recommendations  Wheelchair (measurements PT);Wheelchair cushion (measurements PT);Rolling walker with 5" wheels    Recommendations for Other Services       Precautions / Restrictions Precautions Precautions: Fall Precaution Comments: orthostatic hypotension; pt easily agitated due to psych h/o, is very unsafe; s/p bil great toe amputation 01/02/21 Restrictions Weight Bearing Restrictions: Yes RLE Weight Bearing: Weight bearing as tolerated LLE Weight Bearing: Weight bearing as tolerated Other Position/Activity Restrictions: Post-op shoes present in room    Mobility  Bed Mobility Overal bed mobility: Needs Assistance Bed Mobility: Supine to Sit     Supine to sit: Supervision      General bed mobility comments: Bed flat, cuing pt to come sit L EOB. Supervision and extra time for all for safety.    Transfers Overall transfer level: Needs assistance Equipment used: Rolling walker (2 wheeled) Transfers: Sit to/from Stand Sit to Stand: Min guard         General transfer comment: Sit to stand 1x from EOB and 1x from w/c, cuing pt for hand placement. Min guard for safety.  Ambulation/Gait Ambulation/Gait assistance: Min assist Gait Distance (Feet): 55 Feet (x2 bouts of ~45 ft > ~55 ft) Assistive device: Rolling walker (2 wheeled) Gait Pattern/deviations: Decreased step length - left;Trunk flexed;Step-through pattern;Narrow base of support;Decreased step length - right;Decreased dorsiflexion - right;Decreased dorsiflexion - left;Decreased stride length;Ataxic Gait velocity: reduced Gait velocity interpretation: <1.31 ft/sec, indicative of household ambulator General Gait Details: Slow, unsteady and ataxic like gait with narrow BOS. Cues to widen BOS and take long symmetrical steps bil, poor pt attention to cues. RW management and stability provided with minA. Pt quick to attempt to sit prematurely and needs cues to wait for w/c to be placed posterior to him.   Stairs             Wheelchair Mobility    Modified Rankin (Stroke Patients Only) Modified Rankin (Stroke Patients Only) Pre-Morbid Rankin Score: No symptoms Modified Rankin: Moderately severe disability     Balance Overall balance assessment: Needs assistance Sitting-balance support: Feet supported;No upper extremity supported Sitting balance-Leahy Scale: Fair Sitting balance - Comments: Static sitting EOB, supervision for safety.   Standing balance support: Bilateral upper extremity supported;During functional activity Standing balance-Leahy Scale: Poor Standing balance comment: Reliant on  UE support on RW and external assist.                            Cognition  Arousal/Alertness: Awake/alert Behavior During Therapy: Impulsive Overall Cognitive Status: Impaired/Different from baseline Area of Impairment: Attention;Memory;Following commands;Safety/judgement;Awareness;Problem solving                   Current Attention Level: Sustained (easily distracted by external stimuli) Memory: Decreased short-term memory;Decreased recall of precautions Following Commands: Follows one step commands with increased time;Follows one step commands inconsistently Safety/Judgement: Decreased awareness of safety;Decreased awareness of deficits Awareness: Intellectual Problem Solving: Slow processing;Decreased initiation;Requires verbal cues;Difficulty sequencing;Requires tactile cues General Comments: Pt easily distracted by external stimuli. Requiring significant time throughout for processing and following commands and to verbalize thoughts. Pt not very receptive of cues from therapist and assumes PT is "rushing" him even though educated otherwise.      Exercises      General Comments General comments (skin integrity, edema, etc.): Pt propelled w/c ~30 ft in hallway, alternating use of arms and legs. Tends to drift to R, not very receptive of cues for improved propulsion patterns.      Pertinent Vitals/Pain Pain Assessment: Faces Faces Pain Scale: Hurts a little bit Pain Location: Feet Pain Descriptors / Indicators: Sore;Operative site guarding;Grimacing Pain Intervention(s): Limited activity within patient's tolerance;Monitored during session;Repositioned;Premedicated before session    Home Living                      Prior Function            PT Goals (current goals can now be found in the care plan section) Acute Rehab PT Goals Patient Stated Goal: to ambulate PT Goal Formulation: With patient Time For Goal Achievement: 01/30/21 Potential to Achieve Goals: Fair Progress towards PT goals: Progressing toward goals    Frequency     Min 3X/week      PT Plan Current plan remains appropriate    Co-evaluation              AM-PAC PT "6 Clicks" Mobility   Outcome Measure  Help needed turning from your back to your side while in a flat bed without using bedrails?: None Help needed moving from lying on your back to sitting on the side of a flat bed without using bedrails?: None Help needed moving to and from a bed to a chair (including a wheelchair)?: A Little Help needed standing up from a chair using your arms (e.g., wheelchair or bedside chair)?: A Little Help needed to walk in hospital room?: A Little Help needed climbing 3-5 steps with a railing? : Total 6 Click Score: 18    End of Session Equipment Utilized During Treatment: Gait belt Activity Tolerance: Patient tolerated treatment well Patient left: with call bell/phone within reach;in chair;with chair alarm set Nurse Communication: Mobility status PT Visit Diagnosis: Unsteadiness on feet (R26.81);Other symptoms and signs involving the nervous system (R29.898);Other abnormalities of gait and mobility (R26.89);Ataxic gait (R26.0);Difficulty in walking, not elsewhere classified (R26.2)     Time: 5329-9242 PT Time Calculation (min) (ACUTE ONLY): 18 min  Charges:  $Gait Training: 8-22 mins                     Raymond Gurney, PT, DPT Acute Rehabilitation Services  Pager: [phone] Office: [phone]    Jewel Baize 01/18/2021, 3:13 PM

## 2021-01-19 DIAGNOSIS — I5043 Acute on chronic combined systolic (congestive) and diastolic (congestive) heart failure: Secondary | ICD-10-CM | POA: Diagnosis not present

## 2021-01-19 DIAGNOSIS — R471 Dysarthria and anarthria: Secondary | ICD-10-CM | POA: Diagnosis not present

## 2021-01-19 DIAGNOSIS — G249 Dystonia, unspecified: Secondary | ICD-10-CM | POA: Diagnosis not present

## 2021-01-19 DIAGNOSIS — I639 Cerebral infarction, unspecified: Secondary | ICD-10-CM | POA: Diagnosis not present

## 2021-01-19 NOTE — Progress Notes (Signed)
PROGRESS NOTE    [name]   OEV:[id number]  DOB: 12/28/1965  DOA: 12/17/2020 PCP: [name]   Brief Narrative:  [name] is a 55 year old homeless male with coronary artery disease, chronic systolic diastolic heart failure and a history of a CVA who presented with chest pain followed by unresponsive state with left-sided weakness and left gaze deviation.  The patient was intubated in the ED.  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  Patient was extubated on 1/20.  The patient is persistently dysarthric, high fall risk and unsafe to discharge to homeless shelter.  They are awaiting for skilled nursing facility bed.   Subjective: No complaints for me. He has told the nurse that he plans to leave to hospital.     Assessment & Plan:   Principal Problem:   Ischemic stroke [facility]) Active Problems:   Basilar artery embolus   Acute on chronic combined systolic and diastolic CHF (congestive heart failure) (HCC)   Respiratory failure (HCC)   Ulcer of great toe, left, with necrosis of bone (HCC)   Ulcer of great toe, right, with necrosis of muscle (HCC)   Acute osteomyelitis of toe, left (HCC)   Osteomyelitis of great toe (HCC)   Shock liver   Acute metabolic encephalopathy   CAD (coronary artery disease)   HTN (hypertension)   Bipolar I disorder (HCC)   Cocaine abuse (HCC)   Homelessness   HLD (hyperlipidemia)   Dystonia   Dysarthria   Pain, dental   At this point, all of his medical issues are relatively stable and we are waiting for a safe disposition for him.   Time spent in minutes: 25 DVT prophylaxis: enoxaparin (LOVENOX) injection 40 mg Start: 01/04/21 1430  Code Status: Full code Family Communication:  Level of Care: Level of care: Med-Surg Disposition Plan:  Status is: Inpatient  Remains inpatient appropriate because:Unsafe d/c  plan   Dispo: The patient is from: homeless              Anticipated d/c is to: SNF              Anticipated d/c date is: > 3 days              Patient currently is medically stable to d/c.   Difficult to place patient Yes   Consultants:   Neurology  Cardiology  Podiatry  Palliative  Procedures:    Bilateral great toe amputation Antimicrobials:  Anti-infectives (From admission, onward)   Start     Dose/Rate Route Frequency Ordered Stop   01/14/21 0000  ciprofloxacin (CIPRO) 500 MG tablet  Status:  Discontinued        500 mg Oral 2 times daily 01/14/21 1223 01/15/21    01/05/21 1145  ciprofloxacin (CIPRO) tablet 500 mg        500 mg Oral 2 times daily 01/05/21 1057 01/19/21 0759   01/02/21 1406  vancomycin (VANCOCIN) powder  Status:  Discontinued          As needed 01/02/21 1406 01/02/21 1428   12/17/20 1450  ceFAZolin (ANCEF) 2-4 GM/100ML-% IVPB       Note to Pharmacy: Cristy Friedlander   : cabinet override      12/17/20 1450 12/17/20 1911       Objective: Vitals:   01/18/21 1324 01/18/21 2042 01/18/21 2347 01/19/21 0744  BP: 103/67 100/71 103/84 115/88  Pulse: 62 66 68 67  Resp: 18  19 18   Temp: 97.6 F (36.4 C) 98.5 F (36.9 C) 97.8 F (36.6 C) 98.3 F (36.8 C)  TempSrc: Oral Oral Oral Oral  SpO2: 100%   99%  Weight:      Height:        Intake/Output Summary (Last 24 hours) at 01/19/2021 1012 Last data filed at 01/18/2021 2100 Gross per 24 hour  Intake 441 ml  Output 400 ml  Net 41 ml   Filed Weights   01/12/21 0500 01/13/21 0317 01/17/21 0330  Weight: 88.3 kg 78.7 kg 81 kg    Examination: General exam: Appears comfortable  HEENT: PERRLA, oral mucosa moist, no sclera icterus or thrush Respiratory system: Clear to auscultation. Respiratory effort normal. Cardiovascular system: S1 & S2 heard, RRR.   Gastrointestinal system: Abdomen soft, non-tender, nondistended. Normal bowel sounds. Central nervous system: Alert and oriented. dysarthric- No  other focal neurological deficits. Extremities: No cyanosis, clubbing or edema Skin: No rashes or ulcers Psychiatry:  Mood & affect appropriate.     Data Reviewed: I have personally reviewed following labs and imaging studies  CBC: No results for input(s): WBC, NEUTROABS, HGB, HCT, MCV, PLT in the last 168 hours. Basic Metabolic Panel: No results for input(s): NA, K, CL, CO2, GLUCOSE, BUN, CREATININE, CALCIUM, MG, PHOS in the last 168 hours. GFR: Estimated Creatinine Clearance: 97.1 mL/min (by C-G formula based on SCr of 0.89 mg/dL). Liver Function Tests: No results for input(s): AST, ALT, ALKPHOS, BILITOT, PROT, ALBUMIN in the last 168 hours. No results for input(s): LIPASE, AMYLASE in the last 168 hours. No results for input(s): AMMONIA in the last 168 hours. Coagulation Profile: No results for input(s): INR, PROTIME in the last 168 hours. Cardiac Enzymes: No results for input(s): CKTOTAL, CKMB, CKMBINDEX, TROPONINI in the last 168 hours. BNP (last 3 results) No results for input(s): PROBNP in the last 8760 hours. HbA1C: No results for input(s): HGBA1C in the last 72 hours. CBG: No results for input(s): GLUCAP in the last 168 hours. Lipid Profile: No results for input(s): CHOL, HDL, LDLCALC, TRIG, CHOLHDL, LDLDIRECT in the last 72 hours. Thyroid Function Tests: No results for input(s): TSH, T4TOTAL, FREET4, T3FREE, THYROIDAB in the last 72 hours. Anemia Panel: No results for input(s): VITAMINB12, FOLATE, FERRITIN, TIBC, IRON, RETICCTPCT in the last 72 hours. Urine analysis:    Component Value Date/Time   COLORURINE YELLOW 12/17/2020 1800   APPEARANCEUR CLEAR 12/17/2020 1800   LABSPEC >1.046 (H) 12/17/2020 1800   PHURINE 5.0 12/17/2020 1800   GLUCOSEU >=500 (A) 12/17/2020 1800   HGBUR NEGATIVE 12/17/2020 1800   BILIRUBINUR NEGATIVE 12/17/2020 1800   KETONESUR 5 (A) 12/17/2020 1800   PROTEINUR 100 (A) 12/17/2020 1800   NITRITE NEGATIVE 12/17/2020 1800   LEUKOCYTESUR  NEGATIVE 12/17/2020 1800   Sepsis Labs: @LABRCNTIP (procalcitonin:4,lacticidven:4) )No results found for this or any previous visit (from the past 240 hour(s)).       Radiology Studies: No results found.    Scheduled Meds: . (feeding supplement) PROSource Plus  30 mL Oral BID BM  . aspirin  81 mg Oral Daily  . atorvastatin  40 mg Oral Daily  . carvedilol  6.25 mg Oral BID WC  . divalproex  500 mg Oral Daily  . enoxaparin (LOVENOX) injection  40 mg Subcutaneous Q24H  . feeding supplement  237 mL Oral BID BM  . folic acid  1 mg Oral Daily  . isosorbide dinitrate  5 mg Oral TID  .  levETIRAcetam  500 mg Oral BID  . magnesium oxide  400 mg Oral BID  . methocarbamol  500 mg Oral TID  . morphine  30 mg Oral Q12H  . multivitamin with minerals  1 tablet Oral Daily  . polyethylene glycol  17 g Per Tube Daily  . sacubitril-valsartan  1 tablet Oral BID  . silver sulfADIAZINE   Topical BID  . spironolactone  12.5 mg Oral Daily  . thiamine  100 mg Oral Daily   Continuous Infusions: . lactated ringers Stopped (01/02/21 1552)     LOS: 33 days      Calvert Cantor, MD [facility] Pager: [email or web address] 01/19/2021, 10:12 AM

## 2021-01-20 DIAGNOSIS — G249 Dystonia, unspecified: Secondary | ICD-10-CM | POA: Diagnosis not present

## 2021-01-20 DIAGNOSIS — I639 Cerebral infarction, unspecified: Secondary | ICD-10-CM | POA: Diagnosis not present

## 2021-01-20 DIAGNOSIS — R471 Dysarthria and anarthria: Secondary | ICD-10-CM | POA: Diagnosis not present

## 2021-01-20 DIAGNOSIS — I5043 Acute on chronic combined systolic (congestive) and diastolic (congestive) heart failure: Secondary | ICD-10-CM | POA: Diagnosis not present

## 2021-01-20 NOTE — Progress Notes (Signed)
Patient refused morning meds and wound care

## 2021-01-20 NOTE — Progress Notes (Signed)
PROGRESS NOTE    [name]   GGY:[id number]  DOB: Jan 30, 1966  DOA: 12/17/2020 PCP: [name]   Brief Narrative:  [name] is a 55 year old homeless male with coronary artery disease, chronic systolic diastolic heart failure and a history of a CVA who presented with chest pain followed by unresponsive state with left-sided weakness and left gaze deviation.  The patient was intubated in the ED.  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  Patient was extubated on 1/20.  The patient is persistently dysarthric, high fall risk and unsafe to discharge to homeless shelter.     Subjective: No complaints today.     Assessment & Plan:   Principal Problem:   Ischemic stroke [facility]) Active Problems:   Basilar artery embolus   Acute on chronic combined systolic and diastolic CHF (congestive heart failure) (HCC)   Respiratory failure (HCC)   Ulcer of great toe, left, with necrosis of bone (HCC)   Ulcer of great toe, right, with necrosis of muscle (HCC)   Acute osteomyelitis of toe, left and right- s/p b/l first toe amputations   Shock liver   Acute metabolic encephalopathy   CAD (coronary artery disease)   HTN (hypertension)   Bipolar I disorder (HCC)   Cocaine abuse (HCC)   Homelessness   HLD (hyperlipidemia)   Dystonia   Dysarthria   Pain, dental  He had the staples and sutures removed from b/l first toes on the 18th by podiatry. F/u recommended for 1 wk from now. Dressing changes ordered. At this point, all of his medical issues are relatively stable and we are waiting for a safe disposition for him.   Time spent in minutes: 25 DVT prophylaxis: enoxaparin (LOVENOX) injection 40 mg Start: 01/04/21 1430  Code Status: Full code Family Communication:  Level of Care: Level of care: Med-Surg Disposition Plan:  Status is: Inpatient  Remains inpatient  appropriate because:Unsafe d/c plan   Dispo: The patient is from: homeless              Anticipated d/c is to: SNF              Anticipated d/c date is: > 3 days              Patient currently is medically stable to d/c.   Difficult to place patient Yes   Consultants:   Neurology  Cardiology  Podiatry  Palliative  Procedures:    Bilateral great toe amputation Antimicrobials:  Anti-infectives (From admission, onward)   Start     Dose/Rate Route Frequency Ordered Stop   01/14/21 0000  ciprofloxacin (CIPRO) 500 MG tablet  Status:  Discontinued        500 mg Oral 2 times daily 01/14/21 1223 01/15/21    01/05/21 1145  ciprofloxacin (CIPRO) tablet 500 mg        500 mg Oral 2 times daily 01/05/21 1057 01/19/21 0759   01/02/21 1406  vancomycin (VANCOCIN) powder  Status:  Discontinued          As needed 01/02/21 1406 01/02/21 1428   12/17/20 1450  ceFAZolin (ANCEF) 2-4 GM/100ML-% IVPB       Note to Pharmacy: Cristy Friedlander   : cabinet override      12/17/20 1450 12/17/20 1911       Objective: Vitals:   01/19/21 2011 01/20/21 0500 01/20/21 0742 01/20/21 1235  BP: Marland Kitchen)  127/46  (!) 150/78 100/73  Pulse: 72  73 88  Resp: 18  18 16   Temp: 98.7 F (37.1 C)  98.3 F (36.8 C) 98.2 F (36.8 C)  TempSrc: Oral  Oral Oral  SpO2: 94%   99%  Weight:  74.1 kg    Height:       No intake or output data in the 24 hours ending 01/20/21 1323 Filed Weights   01/13/21 0317 01/17/21 0330 01/20/21 0500  Weight: 78.7 kg 81 kg 74.1 kg    Examination: General exam: Appears comfortable  HEENT: PERRLA, oral mucosa moist, no sclera icterus or thrush Respiratory system: Clear to auscultation. Respiratory effort normal. Cardiovascular system: S1 & S2 heard, RRR.   Gastrointestinal system: Abdomen soft, non-tender, nondistended. Normal bowel sounds. Central nervous system: Alert and oriented. dysarthric- No other focal neurological deficits. Extremities: No cyanosis, clubbing or  edema Skin: No rashes or ulcers Psychiatry:  Mood & affect appropriate.     Data Reviewed: I have personally reviewed following labs and imaging studies  CBC: No results for input(s): WBC, NEUTROABS, HGB, HCT, MCV, PLT in the last 168 hours. Basic Metabolic Panel: No results for input(s): NA, K, CL, CO2, GLUCOSE, BUN, CREATININE, CALCIUM, MG, PHOS in the last 168 hours. GFR: Estimated Creatinine Clearance: 97.1 mL/min (by C-G formula based on SCr of 0.89 mg/dL). Liver Function Tests: No results for input(s): AST, ALT, ALKPHOS, BILITOT, PROT, ALBUMIN in the last 168 hours. No results for input(s): LIPASE, AMYLASE in the last 168 hours. No results for input(s): AMMONIA in the last 168 hours. Coagulation Profile: No results for input(s): INR, PROTIME in the last 168 hours. Cardiac Enzymes: No results for input(s): CKTOTAL, CKMB, CKMBINDEX, TROPONINI in the last 168 hours. BNP (last 3 results) No results for input(s): PROBNP in the last 8760 hours. HbA1C: No results for input(s): HGBA1C in the last 72 hours. CBG: No results for input(s): GLUCAP in the last 168 hours. Lipid Profile: No results for input(s): CHOL, HDL, LDLCALC, TRIG, CHOLHDL, LDLDIRECT in the last 72 hours. Thyroid Function Tests: No results for input(s): TSH, T4TOTAL, FREET4, T3FREE, THYROIDAB in the last 72 hours. Anemia Panel: No results for input(s): VITAMINB12, FOLATE, FERRITIN, TIBC, IRON, RETICCTPCT in the last 72 hours. Urine analysis:    Component Value Date/Time   COLORURINE YELLOW 12/17/2020 1800   APPEARANCEUR CLEAR 12/17/2020 1800   LABSPEC >1.046 (H) 12/17/2020 1800   PHURINE 5.0 12/17/2020 1800   GLUCOSEU >=500 (A) 12/17/2020 1800   HGBUR NEGATIVE 12/17/2020 1800   BILIRUBINUR NEGATIVE 12/17/2020 1800   KETONESUR 5 (A) 12/17/2020 1800   PROTEINUR 100 (A) 12/17/2020 1800   NITRITE NEGATIVE 12/17/2020 1800   LEUKOCYTESUR NEGATIVE 12/17/2020 1800   Sepsis  Labs: @LABRCNTIP (procalcitonin:4,lacticidven:4) )No results found for this or any previous visit (from the past 240 hour(s)).       Radiology Studies: No results found.    Scheduled Meds: . (feeding supplement) PROSource Plus  30 mL Oral BID BM  . aspirin  81 mg Oral Daily  . atorvastatin  40 mg Oral Daily  . carvedilol  6.25 mg Oral BID WC  . divalproex  500 mg Oral Daily  . enoxaparin (LOVENOX) injection  40 mg Subcutaneous Q24H  . feeding supplement  237 mL Oral BID BM  . folic acid  1 mg Oral Daily  . isosorbide dinitrate  5 mg Oral TID  . levETIRAcetam  500 mg Oral BID  . magnesium oxide  400 mg Oral BID  .  methocarbamol  500 mg Oral TID  . morphine  30 mg Oral Q12H  . multivitamin with minerals  1 tablet Oral Daily  . polyethylene glycol  17 g Per Tube Daily  . sacubitril-valsartan  1 tablet Oral BID  . silver sulfADIAZINE   Topical BID  . spironolactone  12.5 mg Oral Daily  . thiamine  100 mg Oral Daily   Continuous Infusions: . lactated ringers Stopped (01/02/21 1552)     LOS: 34 days      Calvert Cantor, MD [facility] Pager: [email or web address] 01/20/2021, 1:23 PM

## 2021-01-21 DIAGNOSIS — I639 Cerebral infarction, unspecified: Secondary | ICD-10-CM | POA: Diagnosis not present

## 2021-01-21 NOTE — Progress Notes (Signed)
[facility] PROGRESS NOTE  Darcey Demma JQZ:[id number] DOB: 27-Oct-1966 DOA: 12/17/2020 PCP: [name]    01/16/2021   Status: Remains inpatient appropriate because:Ongoing active pain requiring inpatient pain management, Unsafe d/c plan and IV treatments appropriate due to intensity of illness or inability to take PO   Dispo:  The patient is from: homeless              Anticipated d/c is to: homeless shelter or streets              Anticipated d/c date is: > 3 days              Patient currently is not medically stable to d/c.  Barriers to discharge: Patient is homeless and currently has significant gait and ambulation issues secondary to recent bilateral amputation of toes.  Because of his polysubstance abuse he has yet to receive an SNF bed offer. Safety concern over his ongoing dysarthria and gait disturbance/high risk for falls if discharged too quickly back to the streets.   Difficult to place patient Yes   Level of care: Med-Surg  Code Status: Full Family Communication: Patient only DVT prophylaxis: Lovenox Vaccination status: Does not appear to be vaccinated against Covid  Foley catheter: No  HPI: 55 year old male with past medical history of coronary artery disease, chronic systolic CHF, CVA presented with chest pain and became unresponsive with left-sided weakness and left gaze deviation.  Was intubated in the ER.  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  Extubated on 1/20 and transferred to [facility] service on 1/21.  Subjective: Patient awake and pleasant with this Clinical research associate.  Had questions stating he was having some difficulty walking utilizing hospital supplied walking shoes.  Form patient that we would work to obtain regular tennis shoes for him to utilize.  He stated this is great and he is hopeful to begin walking more.  Unfortunately, later when PT  came to the room to work with him he was very frustrated and asked him to leave.  PT assumed that because patient was eating lunch that she needed to wait until he was done.  When PT returned patient once again was angry, utilizing profanity and verbally abusive and once again kicked the PT out of the room.  Objective: Vitals:   01/20/21 2347 01/21/21 0700  BP: 103/62 (!) 151/102  Pulse: 76 79  Resp: 18 18  Temp:  98 F (36.7 C)  SpO2: 99% 100%    Intake/Output Summary (Last 24 hours) at 01/21/2021 0742 Last data filed at 01/20/2021 1151 Gross per 24 hour  Intake 234 ml  Output --  Net 234 ml   Filed Weights   01/13/21 0317 01/17/21 0330 01/20/21 0500  Weight: 78.7 kg 81 kg 74.1 kg    Exam:  Constitutional: Awake alert and in no acute distress Respiratory: Lungs are clear on posterior exam, stable on room air Cardiovascular: Heart sounds are normal, pulses regular nontachycardic, no peripheral edema Abdomen: Soft nontender.  Patient eating well.  LBM 2/21 Skin: Bilateral toe amputation clean with some minor blood ooze -the majority of his staples have been removed.  Some were deep and left in place Neurologic: CN 2-12 grossly intact except for persistent dysarthria and word finding difficulty. Sensation intact, Strength 4-5/5 x all 4 extremities.  Continues to have dystonic movements and dysarthria. Psychiatric: Awake and alert.  Oriented x3.  Very moody and can  be pleasant at times and at other times become quite aggressive and frustrated and ask staff to leave the room unnecessarily suspect some of this is baseline personality as well as difficulty communicating secondary to dysarthria as well as mood changes associated with acute CVA.   Assessment/Plan: Acute problems: Acute ischemic stroke-right superior and middle cerebellar peduncle -Reviewed CT head and MRI brain and MRA head and neck -EEG shows diffuse encephalopathy -Continue statin; Continue aspirin and Plavix for 3  weeks (LD due 2/11) and then aspirin alone as per neurology's recommendation -1/25: Due to persistent right-sided weakness repeated CT head, MRI and EEG with no acute findings. -A1c: 6.4%, LDL: 72, echo shows ejection fraction less than 20% with grade 2 diastolic dysfunction -Neurology signed off -PT/OT recommended SNF but with patient's polysubstance abuse history he has not received any bed offers -According to PT note high fall risk and patient now agrees.  At this point he is frightened about being discharged too soon.  Became upset and agitated when wheelchair was delivered to the room thinking that this was an indication he was ready for discharge.  Have reassured patient that we will keep him in the hospital as long as it takes to improve his physical abilities as well as his speech noting he will likely be discharged back to the streets and these issues would definitely impede his safety on the streets. -Repeat MRI on 2/8 as below-no acute infarct-suspect dysarthria and dystonia secondary to side effects from Lyrica as described below -2/17 reconsulted SLP in regards to cognition and speech with ongoing dysarthria  Bipolar disorder/homelessness: -Poor compliance.  Homelessness.   -Consulted psychiatry early during the hospitalization-evaluated and cleared him a danger to himself or to others.  Patient on 2/1 the patient did meet criteria for decision making to go home if desired to under AMA status Repeat evaluation on 2/14.  Patient had improved insight but lacked capacity to make medical decisions.  He was able to state to the psychiatric provider that he agreed to continue to stay in the hospital "I need to do what is right". -Continue Depakote -No typical antipsychotics based on history of prolonged QTC and underlying LV dysfunction  Acute dysarthria with dystonia -MRI negative for acute changes -Symptoms resolved after discontinuation of Lyrica -Has mild residual dystonia likely  related to recent cerebellar stroke -Typically antipsychotics are used to treat dystonia (ballisma) but unable to use 2/2 prolonged qtc-can use AEDs and pt currently on Keppra and Depakote to treat seizure d/o and Bipolar d/o.  Bilateral great toe ulcers/osteomyelitis noted in left/status post multiple toe amputations: -S/p bilateral great toe amputation on 2/2 via podiatry -2/4 resumed DVT prophylaxis with Lovenox -Discussed with TOC pharmacist changed OxyContin to long-acting morphine.  Preauthorization forms completed and have been faxed. -Continue Cipro for a total of 2 weeks with last date 2/19 - podiatrist remove staples on 2/18  Chronic combined systolic and diastolic heart failure with ejection fraction of less than 20% and grade 2 diastolic dysfunction: -Likely cocaine induced -Lasix discontinued on 2/10 secondary to suboptimal blood pressure readings  Continues with -4 L balance -Continue low-dose Coreg, on 2/4 given suboptimal blood pressure reading Imdur changed to shorter acting low-dose isosorbide.  Hydralazine was also discontinued.  Continue Entresto; continue Aldactone -Strict I/O's and daily weight -Follow-up with cardiology outpatient-appreciate cardiology's recommendation  Suspected Seizures: -EEG from 1/17 w/ diffuse encephalopathy, nonspecific etiology.  EEG from 1/19 showed evidence of epileptogenicity arising from right more than left frontal region. -EEG  on 1/26 shows no seizures or epileptiform discharges were seen throughout the recording. -Continue Keppra 500 mg twice daily as per neurology's recommendations  Coronary artery disease: -Cardiology recommended outpatient cath at least 1 month after recovery from stroke. -Continue aspirin and atorvastatin  Physical deconditioning/gait instability -Last PT evaluation was on 2/3 prior to adjustments in pain medications.  Patient was having significant difficulty with mobility secondary to pain has now improved with  adjustments in narcotics -We will order rolling walker and wheelchair noting patient is at high risk for falls although he is making progress needing only minimal assist to come to stand with rolling walker and minimal to mod assist x2 to stand step to the right with a rolling walker -Plan is to remain inpatient until gait and ambulation maximized    Other problems: Acute metabolic/toxic encephalopathy: -Multifactorial in the setting of stroke, drug use, respiratory failure and sedation -Resolved -Continue folic acid, thiamine and multivitamin -Seen by psychiatry-appreciate help  Hypertension:  Blood pressure 140s/100s-likely secondary to pain with significant improvement in hypertension with improved pain control as above. -Monitor blood pressure closely  Acute transaminitis -LFTs as of 2/3 are within normal limits except for low albumin of 3.1 -Elevated LFTs likely related to shock liver phases of admission the mild elevation of total bilirubin to 1.7 therefore not consistent with acute liver failure  Oral pain w/ upper palate edema -Orthopantogram unremarkable -As of 2/10 is resolved  Cocaine use:  Counseled about cessation  Hyponatremia:  Resolved  Bipolar disorder/homelessness: -Poor compliance.  Homelessness.   Consulted psychiatry-evaluated and cleared him a danger to himself or to others. -Continue Depakote  Hyperlipidemia:  Continue statin.    Data Reviewed: Basic Metabolic Panel: No results for input(s): NA, K, CL, CO2, GLUCOSE, BUN, CREATININE, CALCIUM, MG, PHOS in the last 168 hours. Liver Function Tests: No results for input(s): AST, ALT, ALKPHOS, BILITOT, PROT, ALBUMIN in the last 168 hours. No results for input(s): LIPASE, AMYLASE in the last 168 hours. No results for input(s): AMMONIA in the last 168 hours. CBC: No results for input(s): WBC, NEUTROABS, HGB, HCT, MCV, PLT in the last 168 hours. Cardiac Enzymes: No results for input(s):  CKTOTAL, CKMB, CKMBINDEX, TROPONINI in the last 168 hours. BNP (last 3 results) No results for input(s): BNP in the last 8760 hours.  ProBNP (last 3 results) No results for input(s): PROBNP in the last 8760 hours.  CBG: No results for input(s): GLUCAP in the last 168 hours.  No results found for this or any previous visit (from the past 240 hour(s)).   Studies: No results found.  Scheduled Meds: . (feeding supplement) PROSource Plus  30 mL Oral BID BM  . aspirin  81 mg Oral Daily  . atorvastatin  40 mg Oral Daily  . carvedilol  6.25 mg Oral BID WC  . divalproex  500 mg Oral Daily  . enoxaparin (LOVENOX) injection  40 mg Subcutaneous Q24H  . feeding supplement  237 mL Oral BID BM  . folic acid  1 mg Oral Daily  . isosorbide dinitrate  5 mg Oral TID  . levETIRAcetam  500 mg Oral BID  . magnesium oxide  400 mg Oral BID  . methocarbamol  500 mg Oral TID  . morphine  30 mg Oral Q12H  . multivitamin with minerals  1 tablet Oral Daily  . polyethylene glycol  17 g Per Tube Daily  . sacubitril-valsartan  1 tablet Oral BID  . silver sulfADIAZINE   Topical BID  .  spironolactone  12.5 mg Oral Daily  . thiamine  100 mg Oral Daily   Continuous Infusions: . lactated ringers Stopped (01/02/21 1552)    Principal Problem:   Ischemic stroke (HCC) Active Problems:   Basilar artery embolus   Acute on chronic combined systolic and diastolic CHF (congestive heart failure) (HCC)   Respiratory failure (HCC)   Ulcer of great toe, left, with necrosis of bone (HCC)   Ulcer of great toe, right, with necrosis of muscle (HCC)   Acute osteomyelitis of toe, left (HCC)   Osteomyelitis of great toe (HCC)   Shock liver   Acute metabolic encephalopathy   CAD (coronary artery disease)   HTN (hypertension)   Bipolar I disorder (HCC)   Cocaine abuse (HCC)   Homelessness   HLD (hyperlipidemia)   Dystonia   Dysarthria   Pain,  dental   Consultants:  Neurology  Cardiology  Podiatry  Palliative  Procedures: Bilateral great toe amputation  Antibiotics: Anti-infectives (From admission, onward)   Start     Dose/Rate Route Frequency Ordered Stop   01/14/21 0000  ciprofloxacin (CIPRO) 500 MG tablet  Status:  Discontinued        500 mg Oral 2 times daily 01/14/21 1223 01/15/21    01/05/21 1145  ciprofloxacin (CIPRO) tablet 500 mg        500 mg Oral 2 times daily 01/05/21 1057 01/19/21 0759   01/02/21 1406  vancomycin (VANCOCIN) powder  Status:  Discontinued          As needed 01/02/21 1406 01/02/21 1428   12/17/20 1450  ceFAZolin (ANCEF) 2-4 GM/100ML-% IVPB       Note to Pharmacy: Cristy Friedlander   : cabinet override      12/17/20 1450 12/17/20 1911       Time spent: 20 minutes    Junious Silk ANP  [facility] 7 am - 330 pm/M-F for direct patient care and secure chat Please refer to Amion for contact info 35  days

## 2021-01-21 NOTE — Progress Notes (Addendum)
Patient requesting shoes for extra support to continue his progress in ambulating safely.  CSW sent message to Roanna Raider requesting her assistance in obtaining shoes for patient.  Edwin Dada, MSW, [name] Transitions of Care  Clinical Social Worker II [phone]

## 2021-01-21 NOTE — Progress Notes (Signed)
PT Cancellation Note  Patient Details Name: [name] MRN: [id number] DOB: [birth year]   Cancelled Treatment:    Reason Eval/Treat Not Completed: Patient declined, no reason specified. Prior to treatment attempts RN did notify PT that pt has been agitated and may not participate in therapy this date. Upon arrival, pt was indeed agitated and perseverating on wanting to eat before doing anything else. Pt adamantly refused to participate with PT and even began to cuss saying "get the f**k out of the room" when PT tried to educate pt on benefits of PT and need to improve mobility for d/c. Pt interrupting PT continuously. Respected pt's wishes and left as food was being delivered. Returned after pt ate his food with pt still agitated and adamantly refusing to participate. Will follow-up another day.   Raymond Gurney, PT, DPT Acute Rehabilitation Services  Pager: [phone] Office: [phone]    Jewel Baize 01/21/2021, 11:53 AM

## 2021-01-21 NOTE — TOC CAGE-AID Note (Signed)
Transition of Care [facility]) - CAGE-AID Screening   Patient Details  Name: [name] MRN: [id number] Date of Birth: 1966-07-27  Transition of Care [facility]) CM/SW Contact:    Carley Hammed, LCSWA Phone Number: 01/21/2021, 11:27 AM   Clinical Narrative: Pt became agitated and stated he would not speak with CSW as he hadn't eaten and needed milk. Pt's lunch tray was at bedside at this time. CSW ended assessment due to agitation.   CAGE-AID Screening: Substance Abuse Screening unable to be completed due to: : Patient Refused

## 2021-01-21 NOTE — Progress Notes (Signed)
Pt refuses to allow me to do initial assessment. Will retry at a later time.

## 2021-01-22 DIAGNOSIS — I639 Cerebral infarction, unspecified: Secondary | ICD-10-CM | POA: Diagnosis not present

## 2021-01-22 MED ORDER — GABAPENTIN 100 MG PO CAPS
100.0000 mg | ORAL_CAPSULE | Freq: Three times a day (TID) | ORAL | Status: DC
  Administered 2021-01-22 – 2021-01-26 (×7): 100 mg via ORAL
  Filled 2021-01-22 (×11): qty 1

## 2021-01-22 NOTE — Progress Notes (Signed)
Patient refused to take his medications, he agreed to take only his pain meds. Pt also refused for RN to do any assessments.

## 2021-01-22 NOTE — Progress Notes (Signed)
Physical Therapy Treatment Patient Details Name: [name] MRN: [id number] DOB: [birth year] Today's Date: 01/22/2021    History of Present Illness Homeless 55 yo male found down with c/o CP by bystanders. 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 Extubated 1/20. S/p bil great toe amputations 01/02/21.    PT Comments    Patient progressing slowly towards PT goals. Improved ambulation distance with Min A and use of RW for support however continues to demonstrate poor safety awareness and attempting to sit prematurely without the chair behind him. Pt initially pleasant but quickly escalated to becoming agitated towards end of session. Pt not receptive to any cues provided by therapist regarding safety/techniques with mobility. Balance slowly improving but pt's impulsivity, ataxia and poor awareness of deficits/safety are putting pt at increased risk for falls. Will continue to follow. Director made aware of pt wanting to chat with her.   Follow Up Recommendations  SNF;Supervision/Assistance - 24 hour     Equipment Recommendations  Wheelchair (measurements PT);Wheelchair cushion (measurements PT);Rolling walker with 5" wheels    Recommendations for Other Services       Precautions / Restrictions Precautions Precautions: Fall Precaution Comments: orthostatic hypotension; pt easily agitated due to psych h/o, is very unsafe; s/p bil great toe amputation 01/02/21 Restrictions Weight Bearing Restrictions: Yes RLE Weight Bearing: Weight bearing as tolerated LLE Weight Bearing: Weight bearing as tolerated Other Position/Activity Restrictions: Post-op shoes present in room    Mobility  Bed Mobility Overal bed mobility: Needs Assistance Bed Mobility: Supine to Sit     Supine to sit: Supervision;HOB  elevated Sit to supine: Supervision;HOB elevated   General bed mobility comments: No assist needed, supervision for safety. Impulsive at times.    Transfers Overall transfer level: Needs assistance Equipment used: Rolling walker (2 wheeled) Transfers: Sit to/from Stand Sit to Stand: Min assist;Min guard Stand pivot transfers: Min assist       General transfer comment: Min guard to stand from most surfaces, needing Min A at times to steady once upright. Stood from [employer], from w/c x2, tries to sit prematurely throughout session. SPT w/c to bed with Min A, no DME.  Ambulation/Gait Ambulation/Gait assistance: Min assist Gait Distance (Feet): 55 Feet (+55' + 20') Assistive device: Rolling walker (2 wheeled) Gait Pattern/deviations: Decreased step length - left;Trunk flexed;Step-through pattern;Narrow base of support;Decreased step length - right;Decreased dorsiflexion - right;Decreased dorsiflexion - left;Decreased stride length;Ataxic Gait velocity: reduced   General Gait Details: Slow, unsteady and ataxic like gait with narrow BOS. Cues to widen BOS and take long symmetrical steps bil, poor pt attention to cues. RW management and stability provided with minA. Pt quick to attempt to sit prematurely and needs cues to wait for w/c to be placed posterior to him. 2 seated rest breaks.   Stairs             Wheelchair Mobility    Modified Rankin (Stroke Patients Only) Modified Rankin (Stroke Patients Only) Pre-Morbid Rankin Score: No symptoms Modified Rankin: Moderately severe disability     Balance Overall balance assessment: Needs assistance Sitting-balance support: Feet supported;No upper extremity supported Sitting balance-Leahy Scale: Fair Sitting balance - Comments: Static sitting EOB, supervision for safety.   Standing balance support: During functional activity Standing balance-Leahy Scale: Poor Standing balance comment: Reliant on UE support on RW and external  assist.  Cognition Arousal/Alertness: Awake/alert Behavior During Therapy: Impulsive Overall Cognitive Status: Impaired/Different from baseline Area of Impairment: Attention;Memory;Safety/judgement;Following commands;Awareness;Problem solving                   Current Attention Level: Selective Memory: Decreased short-term memory;Decreased recall of precautions Following Commands: Follows one step commands with increased time;Follows one step commands inconsistently Safety/Judgement: Decreased awareness of safety;Decreased awareness of deficits Awareness: Intellectual Problem Solving: Slow processing;Decreased initiation;Requires verbal cues;Difficulty sequencing;Requires tactile cues General Comments: Pt initially in good spirits however quickly escalated to be agitated for no reason towards end of session asking to speak with the "head nurse." Not recepitve to cues from therapist during session which in turn results in increased anger and agitation. Easily distracted and internally upset about "the nurses not doing their job." "you are not doing your job," reporting the therapist was not holding the gait belt tight enough, despite not having a need to do so. Unpredictable behavior throughout session, varying from cordial to irrate.      Exercises      General Comments General comments (skin integrity, edema, etc.): Propelled w/c ~25' in hallway using BLEs, then telling tech to push him back.      Pertinent Vitals/Pain Pain Assessment: Faces Faces Pain Scale: Hurts little more Pain Location: Feet Pain Descriptors / Indicators: Sore;Operative site guarding Pain Intervention(s): Monitored during session;Repositioned    Home Living                      Prior Function            PT Goals (current goals can now be found in the care plan section) Progress towards PT goals: Progressing toward goals (slowly)    Frequency     Min 3X/week      PT Plan Current plan remains appropriate    Co-evaluation              AM-PAC PT "6 Clicks" Mobility   Outcome Measure  Help needed turning from your back to your side while in a flat bed without using bedrails?: None Help needed moving from lying on your back to sitting on the side of a flat bed without using bedrails?: None Help needed moving to and from a bed to a chair (including a wheelchair)?: A Little Help needed standing up from a chair using your arms (e.g., wheelchair or bedside chair)?: A Little Help needed to walk in hospital room?: A Little Help needed climbing 3-5 steps with a railing? : A Lot 6 Click Score: 19    End of Session Equipment Utilized During Treatment: Gait belt Activity Tolerance: Treatment limited secondary to agitation Patient left: in bed;with bed alarm set;with call bell/phone within reach Nurse Communication: Mobility status;Other (comment) (notified Director of pt wanting to chat with him) PT Visit Diagnosis: Unsteadiness on feet (R26.81);Other symptoms and signs involving the nervous system (R29.898);Other abnormalities of gait and mobility (R26.89);Ataxic gait (R26.0);Difficulty in walking, not elsewhere classified (R26.2)     Time: 8416-6063 PT Time Calculation (min) (ACUTE ONLY): 15 min  Charges:  $Gait Training: 8-22 mins                     Vale Haven, PT, DPT Acute Rehabilitation Services Pager [phone] Office [phone]       Marcy Panning 01/22/2021, 12:33 PM

## 2021-01-22 NOTE — Progress Notes (Signed)
[facility] PROGRESS NOTE  Nuchem Grattan GUY:[id number] DOB: 15-Sep-1966 DOA: 12/17/2020 PCP: [name]    01/16/2021   Status: Remains inpatient appropriate because:Ongoing active pain requiring inpatient pain management, Unsafe d/c plan and IV treatments appropriate due to intensity of illness or inability to take PO   Dispo:  The patient is from: homeless              Anticipated d/c is to: homeless shelter or streets              Anticipated d/c date is: > 3 days              Patient currently is not medically stable to d/c.  Barriers to discharge: Patient is homeless and currently has significant gait and ambulation issues secondary to recent bilateral amputation of toes.  Because of his polysubstance abuse he has yet to receive an SNF bed offer. Safety concern over his ongoing severe dysarthria/expressive aphasia and gait disturbance/high risk for falls if discharged too quickly back to the streets.   Difficult to place patient Yes   Level of care: Med-Surg  Code Status: Full Family Communication: Patient only DVT prophylaxis: Lovenox Vaccination status: Does not appear to be vaccinated against Covid  Foley catheter: No  HPI: 55 year old male with past medical history of coronary artery disease, chronic systolic CHF, CVA presented with chest pain and became unresponsive with left-sided weakness and left gaze deviation.  Was intubated in the ER.  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  Extubated on 1/20 and transferred to [facility] service on 1/21.  Subjective: Patient awake and extremely agitated this morning.  Very very frustrated.  Upon entry in the room he actually asked me to please leave the room but instead I calmly spoke to the patient to try to figure out what had upset him.  With much time allowed for patient to respond he was able to state that at  night staff has been unable to get to him to help him to the bathroom and he wait 45 minutes last night.  Because of his dysarthria it takes him quite a bit of time to express his physical needs.  He states he cannot walk to the bathroom alone even with a walker because he falls.  He is also reporting significant burning pain in his feet.  After listening to the patient in determining his needs he calm down.  We agreed to a plan to address the burning pain in his feet.  We also encouraged him to take his scheduled MS Contin as well as the as needed medications already in place.  He also added Neurontin for his neuropathic pain.  I told him I would attempt to get a safety sitter to help address his communication and mobility needs.  If unable to obtain a safety sitter can consider utilizing a bedside commode.  Objective: Vitals:   01/21/21 0700 01/21/21 1943  BP: (!) 151/102 (!) 143/91  Pulse: 79 70  Resp: 18 18  Temp: 98 F (36.7 C)   SpO2: 100% 100%    Intake/Output Summary (Last 24 hours) at 01/22/2021 0814 Last data filed at 01/22/2021 0143 Gross per 24 hour  Intake 150 ml  Output --  Net 150 ml   Filed Weights   01/13/21 0317 01/17/21 0330 01/20/21 0500  Weight: 78.7 kg 81 kg 74.1 kg    Exam:  Constitutional: Awake  alert.  Initially extremely frustrated but wants was able to communicate his needs and request he calmed down and returned to baseline level of interaction and functioning Respiratory: Bilateral lung sounds are clear to auscultation and he is stable on room air Cardiovascular: Normal heart sounds, pulses regular, no peripheral edema. Abdomen: Soft nontender nondistended with normoactive bowel sounds.  Eating well.  LBM 2/21 Skin: Bilateral toe amputation clean -minor blood ooze -the majority of his staples have been removed.  Some were deep and left in place Neurologic: CN 2-12 grossly intact except for persistent dysarthria and word finding difficulty. Sensation intact,  Strength 4-5/5 x all 4 extremities.  Continues to have dystonic movements and significant dysarthria. Psychiatric: Alert and oriented x3.  Pleasant affect after wants and needs addressed   Assessment/Plan: Acute problems: Acute ischemic stroke-right superior and middle cerebellar peduncle -Reviewed CT head and MRI brain and MRA head and neck -EEG shows diffuse encephalopathy -Continue statin; Continue aspirin and Plavix for 3 weeks (LD due 2/11) and then aspirin alone as per neurology's recommendation -1/25: Due to persistent right-sided weakness repeated CT head, MRI and EEG with no acute findings. -A1c: 6.4%, LDL: 72, echo shows ejection fraction less than 20% with grade 2 diastolic dysfunction -Neurology signed off -PT/OT recommended SNF but with patient's polysubstance abuse history he has not received any bed offers -According to PT note high fall risk and patient now agrees.  At this point he is frightened about being discharged too soon.  Became upset and agitated when wheelchair was delivered to the room thinking that this was an indication he was ready for discharge.  Have reassured patient that we will keep him in the hospital as long as it takes to improve his physical abilities as well as his speech noting he will likely be discharged back to the streets and these issues would definitely impede his safety on the streets. -Repeat MRI on 2/8 as below-no acute infarct-suspect dysarthria and dystonia secondary to side effects from Lyrica as described below -2/17 reconsulted SLP in regards to cognition and speech with ongoing dysarthria -Given significant communication needs and mobility needs have requested one-to-one safety sitter to assist with this.  If unable to provide sitter due to staffing can try to utilize bedside commode.  Bipolar disorder/homelessness: -Poor compliance.  Homelessness.   -Consulted psychiatry early during the hospitalization-evaluated and cleared him a danger  to himself or to others.  Patient on 2/1 the patient did meet criteria for decision making to go home if desired to under AMA status Repeat evaluation on 2/14.  Patient had improved insight but lacked capacity to make medical decisions.  He was able to state to the psychiatric provider that he agreed to continue to stay in the hospital "I need to do what is right". -Continue Depakote -No typical antipsychotics based on history of prolonged QTC and underlying LV dysfunction  Acute dysarthria with dystonia -MRI negative for acute changes -Symptoms resolved after discontinuation of Lyrica -Has mild residual dystonia likely related to recent cerebellar stroke -Typically antipsychotics are used to treat dystonia (ballisma) but unable to use 2/2 prolonged qtc-can use AEDs and pt currently on Keppra and Depakote to treat seizure d/o and Bipolar d/o.  Bilateral great toe ulcers/osteomyelitis noted in left/status post multiple toe amputations: -S/p bilateral great toe amputation on 2/2 via podiatry -2/4 resumed DVT prophylaxis with Lovenox -Discussed with TOC pharmacist changed OxyContin to long-acting morphine.  Preauthorization forms completed and have been faxed. -Continue Cipro for a total  of 2 weeks with last date 2/19 - podiatrist remove staples on 2/18 -2/22 reporting significant burning pain in both feet.  Had significant dystonia related to Lyrica.  Have started low-dose Neurontin 100 mg 3 times daily -Continue MS Contin every 12 hours, OxyIR for breakthrough pain and Robaxin as adjunct of therapy  Chronic combined systolic and diastolic heart failure with ejection fraction of less than 20% and grade 2 diastolic dysfunction: -Likely cocaine induced -Lasix discontinued on 2/10 secondary to suboptimal blood pressure readings  -Euvolemic -Continue low-dose Coreg, on 2/4 given suboptimal blood pressure reading Imdur changed to shorter acting low-dose isosorbide.  Hydralazine was also  discontinued.  Continue Entresto; continue Aldactone -Strict I/O's and daily weight -Follow-up with cardiology outpatient-appreciate cardiology's recommendation  Suspected Seizures: -EEG from 1/17 w/ diffuse encephalopathy, nonspecific etiology.  EEG from 1/19 showed evidence of epileptogenicity arising from right more than left frontal region. -EEG on 1/26 shows no seizures or epileptiform discharges were seen throughout the recording. -Continue Keppra 500 mg twice daily as per neurology's recommendations  Coronary artery disease: -Cardiology recommended outpatient cath at least 1 month after recovery from stroke. -Continue aspirin and atorvastatin  Physical deconditioning/gait instability -Last PT evaluation was on 2/3 prior to adjustments in pain medications.  Patient was having significant difficulty with mobility secondary to pain has now improved with adjustments in narcotics -We will order rolling walker and wheelchair noting patient is at high risk for falls although he is making progress needing only minimal assist to come to stand with rolling walker and minimal to mod assist x2 to stand step to the right with a rolling walker -Plan is to remain inpatient until gait and ambulation maximized -Safety sitter requested as above    Other problems: Acute metabolic/toxic encephalopathy: -Multifactorial in the setting of stroke, drug use, respiratory failure and sedation -Resolved -Continue folic acid, thiamine and multivitamin -Seen by psychiatry-appreciate help  Hypertension:  Blood pressure 140s/100s-likely secondary to pain with significant improvement in hypertension with improved pain control as above. -Monitor blood pressure closely  Acute transaminitis -LFTs as of 2/3 are within normal limits except for low albumin of 3.1 -Elevated LFTs likely related to shock liver phases of admission the mild elevation of total bilirubin to 1.7 therefore not consistent with acute  liver failure  Oral pain w/ upper palate edema -Orthopantogram unremarkable -As of 2/10 is resolved  Cocaine use:  Counseled about cessation  Hyponatremia:  Resolved  Bipolar disorder/homelessness: -Poor compliance.  Homelessness.   Consulted psychiatry-evaluated and cleared him a danger to himself or to others. -Continue Depakote  Hyperlipidemia:  Continue statin.    Data Reviewed: Basic Metabolic Panel: No results for input(s): NA, K, CL, CO2, GLUCOSE, BUN, CREATININE, CALCIUM, MG, PHOS in the last 168 hours. Liver Function Tests: No results for input(s): AST, ALT, ALKPHOS, BILITOT, PROT, ALBUMIN in the last 168 hours. No results for input(s): LIPASE, AMYLASE in the last 168 hours. No results for input(s): AMMONIA in the last 168 hours. CBC: No results for input(s): WBC, NEUTROABS, HGB, HCT, MCV, PLT in the last 168 hours. Cardiac Enzymes: No results for input(s): CKTOTAL, CKMB, CKMBINDEX, TROPONINI in the last 168 hours. BNP (last 3 results) No results for input(s): BNP in the last 8760 hours.  ProBNP (last 3 results) No results for input(s): PROBNP in the last 8760 hours.  CBG: No results for input(s): GLUCAP in the last 168 hours.  No results found for this or any previous visit (from the past 240 hour(s)).  Studies: No results found.  Scheduled Meds: . (feeding supplement) PROSource Plus  30 mL Oral BID BM  . aspirin  81 mg Oral Daily  . atorvastatin  40 mg Oral Daily  . carvedilol  6.25 mg Oral BID WC  . divalproex  500 mg Oral Daily  . enoxaparin (LOVENOX) injection  40 mg Subcutaneous Q24H  . feeding supplement  237 mL Oral BID BM  . folic acid  1 mg Oral Daily  . isosorbide dinitrate  5 mg Oral TID  . levETIRAcetam  500 mg Oral BID  . magnesium oxide  400 mg Oral BID  . methocarbamol  500 mg Oral TID  . morphine  30 mg Oral Q12H  . multivitamin with minerals  1 tablet Oral Daily  . polyethylene glycol  17 g Per Tube Daily  .  sacubitril-valsartan  1 tablet Oral BID  . silver sulfADIAZINE   Topical BID  . spironolactone  12.5 mg Oral Daily  . thiamine  100 mg Oral Daily   Continuous Infusions: . lactated ringers Stopped (01/02/21 1552)    Principal Problem:   Ischemic stroke (HCC) Active Problems:   Basilar artery embolus   Acute on chronic combined systolic and diastolic CHF (congestive heart failure) (HCC)   Respiratory failure (HCC)   Ulcer of great toe, left, with necrosis of bone (HCC)   Ulcer of great toe, right, with necrosis of muscle (HCC)   Acute osteomyelitis of toe, left (HCC)   Osteomyelitis of great toe (HCC)   Shock liver   Acute metabolic encephalopathy   CAD (coronary artery disease)   HTN (hypertension)   Bipolar I disorder (HCC)   Cocaine abuse (HCC)   Homelessness   HLD (hyperlipidemia)   Dystonia   Dysarthria   Pain, dental   Consultants:  Neurology  Cardiology  Podiatry  Palliative  Procedures: Bilateral great toe amputation  Antibiotics: Anti-infectives (From admission, onward)   Start     Dose/Rate Route Frequency Ordered Stop   01/14/21 0000  ciprofloxacin (CIPRO) 500 MG tablet  Status:  Discontinued        500 mg Oral 2 times daily 01/14/21 1223 01/15/21    01/05/21 1145  ciprofloxacin (CIPRO) tablet 500 mg        500 mg Oral 2 times daily 01/05/21 1057 01/19/21 0759   01/02/21 1406  vancomycin (VANCOCIN) powder  Status:  Discontinued          As needed 01/02/21 1406 01/02/21 1428   12/17/20 1450  ceFAZolin (ANCEF) 2-4 GM/100ML-% IVPB       Note to Pharmacy: Cristy Friedlander   : cabinet override      12/17/20 1450 12/17/20 1911       Time spent: 20 minutes    Junious Silk ANP  [facility] 7 am - 330 pm/M-F for direct patient care and secure chat Please refer to Amion for contact info 36  days

## 2021-01-22 NOTE — Progress Notes (Addendum)
3pm: DTP team delivered patient's shoes to his room - he expressed gratitude and is optimistic about his journey to recovery.  10:30am: DTP team informed patient that his shoes had been obtained, that we were waiting for them to be delivered this afternoon. Team emphasized the importance of working with medical team to make progress towards safe ambulation and independence and to refrain from using profanity towards staff.  Conversations with patient are pleasant and often comical - he requires additional time to transition his thoughts into words.  Edwin Dada, MSW, [name] Transitions of Care  Clinical Social Worker II [phone]

## 2021-01-22 NOTE — Progress Notes (Signed)
Patient refused assessment orientation is intact, patient says he has the right to refuse. I offered medications and snacks; specified meds were for his heart and pain.He denies pain or hunger. At 2200 meds and food were offered again, patient continues to refuse, interrupting staff and demanding myself and the nurse tech to get out and close the door. At 0130 a different NT offered a bath and [name] accepted care.  Will continue to offer scheduled care and safety rounding.

## 2021-01-23 DIAGNOSIS — I639 Cerebral infarction, unspecified: Secondary | ICD-10-CM | POA: Diagnosis not present

## 2021-01-23 MED ORDER — DIVALPROEX SODIUM ER 500 MG PO TB24
750.0000 mg | ORAL_TABLET | Freq: Every day | ORAL | Status: DC
  Administered 2021-01-24 – 2021-02-02 (×7): 750 mg via ORAL
  Filled 2021-01-23 (×11): qty 1

## 2021-01-23 MED ORDER — MORPHINE SULFATE ER 15 MG PO TBCR
30.0000 mg | EXTENDED_RELEASE_TABLET | Freq: Two times a day (BID) | ORAL | Status: DC
  Administered 2021-01-23 – 2021-01-24 (×2): 30 mg via ORAL
  Filled 2021-01-23 (×5): qty 2

## 2021-01-23 NOTE — Progress Notes (Signed)
Physical Therapy Treatment Patient Details Name: [name] MRN: [id number] DOB: [birth year] Today's Date: 01/23/2021    History of Present Illness Homeless 55 yo male found down with c/o CP by bystanders. 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 Extubated 1/20. S/p bil great toe amputations 01/02/21.    PT Comments    Pt tolerates treatment well despite becoming quickly agitated multiple times during session. Pt demanding therapy come by 4 times a day and upset with PT response that this is not possible. Pt continues to demonstrate poor safety awareness and requires assistance for ADLs this session. Pt will benefit from continued acute PT POC to improve balance and reduce falls risk. PT continues to recommend SNF placement.   Follow Up Recommendations  SNF;Supervision/Assistance - 24 hour     Equipment Recommendations  Wheelchair (measurements PT);Wheelchair cushion (measurements PT);Rolling walker with 5" wheels    Recommendations for Other Services       Precautions / Restrictions Precautions Precautions: Fall Precaution Comments: orthostatic hypotension; pt easily agitated due to psych h/o, is very unsafe; s/p bil great toe amputation 01/02/21 Required Braces or Orthoses: Other Brace Other Brace: Bilateral post-op shoes Restrictions Weight Bearing Restrictions: Yes RLE Weight Bearing: Weight bearing as tolerated LLE Weight Bearing: Weight bearing as tolerated Other Position/Activity Restrictions: Post-op shoes present in room    Mobility  Bed Mobility Overal bed mobility: Modified Independent Bed Mobility: Supine to Sit;Sit to Supine     Supine to sit: Modified independent (Device/Increase time) Sit to supine: Modified independent (Device/Increase time)   General bed mobility comments:  increased time    Transfers Overall transfer level: Needs assistance Equipment used: Rolling walker (2 wheeled) Transfers: Sit to/from Stand Sit to Stand: Min guard Stand pivot transfers: Min guard       General transfer comment: pt performs stand pivot from wheelchair to bed without walker or locking wheelchair brakes  Ambulation/Gait Ambulation/Gait assistance: Min guard Gait Distance (Feet): 120 Feet (additonal 100') Assistive device: Rolling walker (2 wheeled) Gait Pattern/deviations: Step-through pattern Gait velocity: reduced Gait velocity interpretation: <1.31 ft/sec, indicative of household ambulator General Gait Details: pt with slowed step-through gait, no significant LE ataxia noted, post-op shoe often falling off R foot   Stairs             Wheelchair Mobility    Modified Rankin (Stroke Patients Only) Modified Rankin (Stroke Patients Only) Pre-Morbid Rankin Score: No symptoms Modified Rankin: Moderately severe disability     Balance Overall balance assessment: Needs assistance Sitting-balance support: No upper extremity supported;Feet supported Sitting balance-Leahy Scale: Good Sitting balance - Comments: Static sitting EOB, supervision for safety. Mild L latearl LOB with doffing footwear seated EOB.   Standing balance support: Single extremity supported;Bilateral upper extremity supported Standing balance-Leahy Scale: Poor Standing balance comment: Reliant on UE support on RW and minG                            Cognition Arousal/Alertness: Awake/alert Behavior During Therapy: Impulsive Overall Cognitive Status: Impaired/Different from baseline Area of Impairment: Memory;Attention;Safety/judgement;Awareness;Problem solving;Following commands                   Current Attention Level: Selective Memory: Decreased recall of precautions;Decreased short-term memory Following Commands: Follows one step commands inconsistently  (more-so by choice and  not ability to follow commands) Safety/Judgement: Decreased awareness of safety;Decreased awareness of deficits Awareness: Intellectual Problem Solving: Slow processing;Difficulty sequencing;Decreased initiation;Requires verbal cues;Requires tactile cues General Comments: Patient with better safety awareness this date although still greatly decreased. Patient following commands for safety with 75% accuracy. Patient demonstrates some knowledge of current deficits.      Exercises      General Comments General comments (skin integrity, edema, etc.): VSS on RA, impulsive and quickly agitated multiple times this session      Pertinent Vitals/Pain Pain Assessment: No/denies pain Faces Pain Scale: Hurts little more Pain Location: Feet Pain Descriptors / Indicators: Sore;Operative site guarding Pain Intervention(s): Limited activity within patient's tolerance;Monitored during session;Repositioned;Premedicated before session    Home Living                      Prior Function            PT Goals (current goals can now be found in the care plan section) Acute Rehab PT Goals Patient Stated Goal: To get better. Progress towards PT goals: Progressing toward goals    Frequency    Min 3X/week      PT Plan Current plan remains appropriate    Co-evaluation              AM-PAC PT "6 Clicks" Mobility   Outcome Measure  Help needed turning from your back to your side while in a flat bed without using bedrails?: None Help needed moving from lying on your back to sitting on the side of a flat bed without using bedrails?: None Help needed moving to and from a bed to a chair (including a wheelchair)?: A Little Help needed standing up from a chair using your arms (e.g., wheelchair or bedside chair)?: A Little Help needed to walk in hospital room?: A Little Help needed climbing 3-5 steps with a railing? : A Lot 6 Click Score: 19    End of Session  Equipment Utilized During Treatment: Gait belt Activity Tolerance: Patient tolerated treatment well Patient left: in bed;with call bell/phone within reach;with bed alarm set Nurse Communication: Mobility status PT Visit Diagnosis: Unsteadiness on feet (R26.81);Other symptoms and signs involving the nervous system (R29.898);Other abnormalities of gait and mobility (R26.89);Ataxic gait (R26.0);Difficulty in walking, not elsewhere classified (R26.2)     Time: 3244-0102 PT Time Calculation (min) (ACUTE ONLY): 17 min  Charges:  $Gait Training: 8-22 mins                     Arlyss Gandy, PT, DPT Acute Rehabilitation Pager: [phone]    Arlyss Gandy 01/23/2021, 5:42 PM

## 2021-01-23 NOTE — Progress Notes (Signed)
This EN went into pt room and introduced self to Pt that she will be his nurse for the  Night shif pt had cluster of food trays in the room RN wanted to tidy up pt stated leave everything as it is do not touch any thing.    Two urinal at bed side filled with Urine RN emptied them and put close to pt.  Bed alarm turne on offered pt to put on yellow socks pt refused. And told RN to get out of the room. And close the  Door.  This rn told pt to call  For assistance when needed and told pt night medication will be given around 2130-10pm. and left the room.

## 2021-01-23 NOTE — Progress Notes (Signed)
SLP Cancellation Note  Patient Details Name: Ryin Ambrosius MRN: [id number] DOB: 02-26-1966   Cancelled treatment:       Reason Eval/Treat Not Completed: Patient at procedure or test/unavailable; SLP unable to see Pt for cognitive linguistic therapy at this time, as Pt out of his room with another therapist. Will check back as schedule permits.  Thank you,  Havery Moros, CCC-SLP [phone]    Lindberg Zenon 01/23/2021, 12:45 PM

## 2021-01-23 NOTE — Progress Notes (Addendum)
1pm: CSW received return call from [place] reports that he is a Education administrator and that he has been friends with the patient since summer of 2018. Alac reports the two met in [facility] whenever the patient was staying at [place] with [employer], a homeless shelter. Alac reports he assists the patient with his money each month by maintaining his online bank account. Alac reports the patient's brother Demetrius lives in [place], [place] spoke with patient at bedside to obtain permission to contact his brother Demetrius - patient refused. Patient adamant he is going to [place] via bus once discharged. Patient states he does not have any friends or family in [place] so he cannot provide an address to his destination.   10:20am: CSW inquired with patient at bedside if he would be agreeable to placement at a shelter or use his personal funds for a hotel, he stated yes. Patient reports his friend Alac Oren Bracket assists him with his money and that he doesn't have physical possession of his debit card at this time. Patient gave CSW permission to contact his Alac.  CSW attempted to reach Alac at [phone] without success - a voicemail was left requesting a return call.  Madilyn Fireman, MSW, [name] Transitions of Care  Clinical Social Worker II [phone]

## 2021-01-23 NOTE — Progress Notes (Signed)
Occupational Therapy Treatment Patient Details Name: [name] MRN: [id number] DOB: Jun 03, 1966 Today's Date: 01/23/2021    History of present illness Homeless 55 yo male found down with c/o CP by bystanders. 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 Extubated 1/20. S/p bil great toe amputations 01/02/21.   OT comments  Patient met lying supine in bed this date. Upon entry patient eager to ambulate in hallway. Patient declines all ADL tasks at this time despite education on needing to increase independence in prep for safe d/c. Patient in agreement with working toward increasing independence with ADLs at time of next treatment session. Patient completes bed mobility with supervision A for safety and functional mobility in hallway around nurses station and back to room with seated rest breaks as needed. Patient very motivated to walk further distances stating "I know how to use the wheelchair, I want to learn how to walk". Patient with continued deficits in functional mobility requiring Min A. Patient also continues to be limited by decreased balance, ataxia, dysarthria, and decreased cognition. Patient lying supine in bed at conclusion of treatment session with call bell within reach and all needs met. OT will continue to follow acutely.    Follow Up Recommendations  SNF;Supervision/Assistance - 24 hour    Equipment Recommendations  3 in 1 bedside commode;Wheelchair (measurements OT);Wheelchair cushion (measurements OT)    Recommendations for Other Services      Precautions / Restrictions Precautions Precautions: Fall Precaution Comments: orthostatic hypotension; pt easily agitated due to psych h/o, is very unsafe; s/p bil great toe amputation 01/02/21 Required Braces or Orthoses: Other Brace Other Brace:  Bilateral post-op shoes Restrictions Weight Bearing Restrictions: Yes RLE Weight Bearing: Weight bearing as tolerated LLE Weight Bearing: Weight bearing as tolerated Other Position/Activity Restrictions: Post-op shoes present in room       Mobility Bed Mobility Overal bed mobility: Needs Assistance Bed Mobility: Supine to Sit     Supine to sit: Supervision;HOB elevated Sit to supine: Supervision;HOB elevated   General bed mobility comments: No assist needed, supervision for safety. Impulsive at times.    Transfers Overall transfer level: Needs assistance Equipment used: Rolling walker (2 wheeled) Transfers: Sit to/from Stand Sit to Stand: Min assist;Min guard Stand pivot transfers: Min assist       General transfer comment: Min guard to Min A for sit to stand x several trials with cues for hand placement and safety.    Balance   Sitting-balance support: Feet supported;No upper extremity supported Sitting balance-Leahy Scale: Fair Sitting balance - Comments: Static sitting EOB, supervision for safety. Mild L latearl LOB with doffing footwear seated EOB.   Standing balance support: During functional activity Standing balance-Leahy Scale: Poor Standing balance comment: Reliant on UE support on RW and external assist.                           ADL either performed or assessed with clinical judgement   ADL                       Lower Body Dressing: Moderate assistance;Sit to/from stand Lower Body Dressing Details (indicate cue type and reason): Patient able to doff bilateral post-op shoes and socks with supervision A for safety.  Functional mobility during ADLs: Minimal assistance;Rolling walker;Min guard General ADL Comments: Patient declined all ADLs this date. Min A for functional mobility in hallway up to 259f with use of RW and cues for safety. Seated rest breaks throughout to adjust R post-op shoe. Patient with continued balance  and gait deficits.     Vision       Perception     Praxis      Cognition Arousal/Alertness: Awake/alert Behavior During Therapy: Impulsive Overall Cognitive Status: Impaired/Different from baseline Area of Impairment: Attention;Memory;Safety/judgement;Following commands;Awareness;Problem solving                   Current Attention Level: Selective Memory: Decreased short-term memory;Decreased recall of precautions Following Commands: Follows one step commands with increased time;Follows one step commands inconsistently Safety/Judgement: Decreased awareness of safety;Decreased awareness of deficits Awareness: Intellectual Problem Solving: Slow processing;Decreased initiation;Requires verbal cues;Difficulty sequencing;Requires tactile cues General Comments: Patient with better safety awareness this date although still greatly decreased. Patient following commands for safety with 75% accuracy. Patient demonstrates some knowledge of current deficits.        Exercises     Shoulder Instructions       General Comments      Pertinent Vitals/ Pain       Pain Assessment: Faces Faces Pain Scale: Hurts little more Pain Location: Feet Pain Descriptors / Indicators: Sore;Operative site guarding Pain Intervention(s): Limited activity within patient's tolerance;Monitored during session;Repositioned;Premedicated before session  Home Living                                          Prior Functioning/Environment              Frequency  Min 2X/week        Progress Toward Goals  OT Goals(current goals can now be found in the care plan section)  Progress towards OT goals: Progressing toward goals  Acute Rehab OT Goals Patient Stated Goal: To get better. OT Goal Formulation: With patient Time For Goal Achievement: 01/31/21 Potential to Achieve Goals: Good ADL Goals Pt Will Perform Eating: Independently;sitting Pt Will Perform Grooming: with min  guard assist;sitting;standing Pt Will Perform Upper Body Bathing: with min guard assist;sitting Pt Will Perform Lower Body Bathing: with min guard assist;sit to/from stand Pt Will Perform Upper Body Dressing: with min guard assist;sitting Pt Will Perform Lower Body Dressing: with min guard assist;sit to/from stand Pt Will Transfer to Toilet: with min guard assist;ambulating;bedside commode Pt Will Perform Toileting - Clothing Manipulation and hygiene: with min guard assist;sit to/from stand;sitting/lateral leans Additional ADL Goal #1: Pt will be S in and OOB for basic ADLs Additional ADL Goal #2: Pt will be able to locate all items he needs for task without cues  Plan Discharge plan remains appropriate;Frequency remains appropriate    Co-evaluation                 AM-PAC OT "6 Clicks" Daily Activity     Outcome Measure   Help from another person eating meals?: A Little Help from another person taking care of personal grooming?: A Lot Help from another person toileting, which includes using toliet, bedpan, or urinal?: A Lot Help from another person bathing (including washing, rinsing, drying)?: A Lot Help from another person to put on and taking off regular upper body clothing?: A Little Help from another person to put on and taking off regular  lower body clothing?: A Lot 6 Click Score: 14    End of Session Equipment Utilized During Treatment: Gait belt;Rolling walker;Other (comment) (bilateral post-op shoes.)  OT Visit Diagnosis: Unsteadiness on feet (R26.81);Other abnormalities of gait and mobility (R26.89);Muscle weakness (generalized) (M62.81);Other symptoms and signs involving cognitive function;Hemiplegia and hemiparesis Hemiplegia - Right/Left: Right Hemiplegia - dominant/non-dominant: Dominant Hemiplegia - caused by: Cerebral infarction   Activity Tolerance Patient tolerated treatment well   Patient Left in bed;with call bell/phone within reach;with bed alarm set    Nurse Communication Other (comment) (Patient needing dressing change)        Time: 9030-1499 OT Time Calculation (min): 28 min  Charges: OT General Charges $OT Visit: 1 Visit OT Treatments $Therapeutic Activity: 23-37 mins  Neko Mcgeehan H. OTR/L Supplemental OT, Department of rehab services [phone]   Nimrod Wendt R H. 01/23/2021, 3:00 PM

## 2021-01-23 NOTE — Progress Notes (Signed)
Pt refused  Vs to be taken  MD made aware.

## 2021-01-23 NOTE — Progress Notes (Signed)
Patient allowed some assessment done and refused At 2250, 2305, and 2330 patient refused all night time medications, vital signs and wound care. Patient has been educated about the importance of taking medications and the possible effects of not taking scheduled medications as prescribed.   Will try to obtain vital signs later in the shift and do wound care later as well.

## 2021-01-23 NOTE — Progress Notes (Addendum)
[facility] PROGRESS NOTE  [name] SWH:[id number] DOB: 10-Jan-1966 DOA: 12/17/2020 PCP: [name]    01/16/2021   Status: Remains inpatient appropriate because:Ongoing active pain requiring inpatient pain management, Unsafe d/c plan and IV treatments appropriate due to intensity of illness or inability to take PO   Dispo:  The patient is from: homeless              Anticipated d/c is to: [facility] pending availability of 1st floor room              Anticipated d/c date is: > 3 days              Patient currently is not medically stable to d/c.  Barriers to discharge: Patient is homeless and currently has significant gait and ambulation issues secondary to recent bilateral amputation of toes.  Because of his polysubstance abuse he has yet to receive an SNF bed offer. Safety concern over his ongoing severe dysarthria/expressive aphasia and gait disturbance/high risk for falls if discharged too quickly back to the streets.   Difficult to place patient Yes   Level of care: Med-Surg  Code Status: Full Family Communication: Patient only DVT prophylaxis: Lovenox Vaccination status: Does not appear to be vaccinated against Covid  Foley catheter: No  HPI: 55 year old male with past medical history of coronary artery disease, chronic systolic CHF, CVA presented with chest pain and became unresponsive with left-sided weakness and left gaze deviation.  Was intubated in the ER.  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  Extubated on 1/20 and transferred to [facility] service on 1/21.  Subjective: Patient awake.  Remains frustrated over continued delays and nighttime request for assistance to toilet.  Talking about going home today.  Again educated patient that clinically he is not safe noting he is not able to independently ambulate (which he confirms) and that given his  dysarthria along with need to be dependent upon assistive devices and keep narcotics and other medications with him while living on the streets that this would be unsafe and put him at risk for physical assault and robbery.  At first patient did not understand but after this was explained he seemed to understand that going to the streets would not be safe.  He does not wish to go to a homeless shelter.  We talked about local hotels including [facility] in which has long-term hotel room rentals.  Explained to him that once he is more medically stable we would like to see if they have a first-floor room that he can discharge to.  This would allow a more permanent residence with an address and would allow him to receive transportation services through the county.  He was agreeable to this plan but continues to insist that discharge should be imminent over the next few days.  We will continue to work with him.  Objective: Vitals:   01/21/21 1943 01/22/21 1934  BP: (!) 143/91 130/70  Pulse: 70 84  Resp: 18 18  SpO2: 100% 98%    Intake/Output Summary (Last 24 hours) at 01/23/2021 0803 Last data filed at 01/22/2021 2305 Gross per 24 hour  Intake 680 ml  Output 950 ml  Net -270 ml   Filed Weights   01/17/21 0330 01/20/21 0500 01/23/21 0314  Weight: 81 kg 74.1 kg 73.9 kg    Exam:  Constitutional: Alert, initially agitated but calmed down after extensive discussion  as outlined above Respiratory: Lungs are clear, stable on room air Cardiovascular: Heart sounds are S1-S2, pulses regular nontachycardic, extremities warm to touch with appropriate capillary refill Abdomen: Soft nontender nondistended with normoactive bowel sounds.  Eating well. LBM 2/21 Skin: Bilateral toe amputation clean -minor blood ooze -the majority of his staples have been removed.  Some were deep and left in place Neurologic: CN 2-12 grossly intact except for persistent dysarthria and word finding difficulty. Sensation intact,  Strength 4-5/5 x all 4 extremities.  Continues to have dystonic movements and significant dysarthria. Psychiatric: Awake and alert.  Agitated but redirectable oriented to name date place and time but insight into his current physical debilities and risks if discharged at this time are lacking.   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 -A1c: 6.4%, LDL: 72, echo shows ejection fraction less than 20% with grade 2 diastolic dysfunction -Neurology signed off -PT/OT recommended SNF but with patient's polysubstance abuse history he has not received any bed offers -PT continues to recommend SNF.  At this juncture best option would be to focus on more aggressive therapy until patient able to manage independently in long-term hotel room as described above. -Repeat MRI on 2/8 as below-no acute infarct-suspect dysarthria and dystonia secondary to side effects from Lyrica as described below -2/17 reconsulted SLP in regards to cognition and speech with ongoing dysarthria -Given significant communication needs and mobility needs have requested one-to-one safety sitter to assist with this.  If unable to provide sitter due to staffing can try to utilize bedside commode.  Bipolar disorder/homelessness: -Poor compliance.  Homelessness.   -Consulted psychiatry early during the hospitalization-evaluated and cleared him a danger to himself or to others.  Repeat evaluation on 2/14.  Patient had improved insight but lacked capacity to make medical decisions.  He was able to state to the psychiatric provider that he agreed to continue to stay in  the hospital "I need to do what is right". -Continue Depakote -No typical antipsychotics based on history of prolonged QTC and underlying LV dysfunction  Acute dysarthria with dystonia -MRI negative for acute changes -Symptoms resolved after discontinuation of Lyrica -Has mild residual dystonia likely related to recent cerebellar stroke -Typically antipsychotics are used to treat dystonia (ballisma) but unable to use 2/2 prolonged qtc-can use AEDs and pt currently on Keppra and Depakote to treat seizure d/o and Bipolar d/o. -2/23 will increase Depakote to 750 mg daily given increase in mood swings  Bilateral great toe ulcers/osteomyelitis noted in left/status post multiple toe amputations: -S/p bilateral great toe amputation on 2/2 via podiatry -2/4 resumed DVT prophylaxis with Lovenox -Discussed with TOC pharmacist changed OxyContin to long-acting morphine.  Preauthorization forms completed and have been faxed. -Continue Cipro for a total of 2 weeks with last date 2/19 - podiatrist remove staples on 2/18 -2/22 reporting significant burning pain in both feet.  Had significant dystonia related to Lyrica.  Started low-dose Neurontin 100 mg 3 times daily -Continue MS Contin every 12 hours, OxyIR for breakthrough pain and Robaxin as adjunct of therapy  Chronic combined systolic and diastolic heart failure with ejection fraction of less than 20% and grade 2 diastolic dysfunction: -Likely cocaine induced -  Lasix discontinued on 2/10 secondary to suboptimal blood pressure readings  -Euvolemic -Continue low-dose Coreg, on 2/4 given suboptimal blood pressure reading Imdur changed to shorter acting low-dose isosorbide.  Hydralazine was also discontinued.  Continue Entresto; continue Aldactone -Strict I/O's and daily weight -Follow-up with cardiology outpatient-appreciate cardiology's recommendation  Suspected Seizures: -EEG from 1/17 w/ diffuse encephalopathy, nonspecific etiology.  EEG from 1/19  showed evidence of epileptogenicity arising from right more than left frontal region. -EEG on 1/26 shows no seizures or epileptiform discharges were seen throughout the recording. -Continue Keppra 500 mg twice daily as per neurology's recommendations  Coronary artery disease: -Cardiology recommended outpatient cath at least 1 month after recovery from stroke. -Continue aspirin and atorvastatin  Physical deconditioning/gait instability -Last PT evaluation was on 2/3 prior to adjustments in pain medications.  Patient was having significant difficulty with mobility secondary to pain has now improved with adjustments in narcotics -We will order rolling walker and wheelchair noting patient is at high risk for falls although he is making progress needing only minimal assist to come to stand with rolling walker and minimal to mod assist x2 to stand step to the right with a rolling walker -Plan is to remain inpatient until gait and ambulation maximized -Safety sitter requested as above    Other problems: Acute metabolic/toxic encephalopathy: -Multifactorial in the setting of stroke, drug use, respiratory failure and sedation -Resolved -Continue folic acid, thiamine and multivitamin -Seen by psychiatry-appreciate help  Hypertension:  Blood pressure 140s/100s-likely secondary to pain with significant improvement in hypertension with improved pain control as above. -Monitor blood pressure closely  Acute transaminitis -LFTs as of 2/3 are within normal limits except for low albumin of 3.1 -Elevated LFTs likely related to shock liver phases of admission the mild elevation of total bilirubin to 1.7 therefore not consistent with acute liver failure  Oral pain w/ upper palate edema -Orthopantogram unremarkable -As of 2/10 is resolved  Cocaine use:  Counseled about cessation  Hyponatremia:  Resolved  Bipolar disorder/homelessness: -Poor compliance.  Homelessness.   Consulted  psychiatry-evaluated and cleared him a danger to himself or to others. -Continue Depakote  Hyperlipidemia:  Continue statin.    Data Reviewed: Basic Metabolic Panel: No results for input(s): NA, K, CL, CO2, GLUCOSE, BUN, CREATININE, CALCIUM, MG, PHOS in the last 168 hours. Liver Function Tests: No results for input(s): AST, ALT, ALKPHOS, BILITOT, PROT, ALBUMIN in the last 168 hours. No results for input(s): LIPASE, AMYLASE in the last 168 hours. No results for input(s): AMMONIA in the last 168 hours. CBC: No results for input(s): WBC, NEUTROABS, HGB, HCT, MCV, PLT in the last 168 hours. Cardiac Enzymes: No results for input(s): CKTOTAL, CKMB, CKMBINDEX, TROPONINI in the last 168 hours. BNP (last 3 results) No results for input(s): BNP in the last 8760 hours.  ProBNP (last 3 results) No results for input(s): PROBNP in the last 8760 hours.  CBG: No results for input(s): GLUCAP in the last 168 hours.  No results found for this or any previous visit (from the past 240 hour(s)).   Studies: No results found.  Scheduled Meds:  (feeding supplement) PROSource Plus  30 mL Oral BID BM   aspirin  81 mg Oral Daily   atorvastatin  40 mg Oral Daily   carvedilol  6.25 mg Oral BID WC   divalproex  500 mg Oral Daily   enoxaparin (LOVENOX) injection  40 mg Subcutaneous Q24H   feeding supplement  237 mL Oral BID BM   folic acid  1 mg Oral Daily   gabapentin  100 mg Oral TID   isosorbide dinitrate  5 mg Oral TID   levETIRAcetam  500 mg Oral BID   magnesium oxide  400 mg Oral BID   methocarbamol  500 mg Oral TID   morphine  30 mg Oral Q12H   multivitamin with minerals  1 tablet Oral Daily   polyethylene glycol  17 g Per Tube Daily   sacubitril-valsartan  1 tablet Oral BID   silver sulfADIAZINE   Topical BID   spironolactone  12.5 mg Oral Daily   thiamine  100 mg Oral Daily   Continuous Infusions:  lactated ringers Stopped (01/02/21 1552)    Principal  Problem:   Ischemic stroke (HCC) Active Problems:   Basilar artery embolus   Acute on chronic combined systolic and diastolic CHF (congestive heart failure) (HCC)   Respiratory failure (HCC)   Ulcer of great toe, left, with necrosis of bone (HCC)   Ulcer of great toe, right, with necrosis of muscle (HCC)   Acute osteomyelitis of toe, left (HCC)   Osteomyelitis of great toe (HCC)   Shock liver   Acute metabolic encephalopathy   CAD (coronary artery disease)   HTN (hypertension)   Bipolar I disorder (HCC)   Cocaine abuse (HCC)   Homelessness   HLD (hyperlipidemia)   Dystonia   Dysarthria   Pain, dental   Consultants:  Neurology  Cardiology  Podiatry  Palliative  Procedures: Bilateral great toe amputation  Antibiotics: Anti-infectives (From admission, onward)   Start     Dose/Rate Route Frequency Ordered Stop   01/14/21 0000  ciprofloxacin (CIPRO) 500 MG tablet  Status:  Discontinued        500 mg Oral 2 times daily 01/14/21 1223 01/15/21    01/05/21 1145  ciprofloxacin (CIPRO) tablet 500 mg        500 mg Oral 2 times daily 01/05/21 1057 01/19/21 0759   01/02/21 1406  vancomycin (VANCOCIN) powder  Status:  Discontinued          As needed 01/02/21 1406 01/02/21 1428   12/17/20 1450  ceFAZolin (ANCEF) 2-4 GM/100ML-% IVPB       Note to Pharmacy: Cristy Friedlander   : cabinet override      12/17/20 1450 12/17/20 1911       Time spent: 20 minutes    Junious Silk ANP  [facility] 7 am - 330 pm/M-F for direct patient care and secure chat Please refer to Amion for contact info 37  days

## 2021-01-23 NOTE — Progress Notes (Signed)
RN went to take vS in order to give pt night time medication pt refused and stated yelling and cursing out that he does not want anything  This RN tried to explained to pt the need to  Provide care and take his medication pt got angry and told this RN to get out of his room and used the  ...F,,, word. This RN got out and returned all medications into the pyxis Charge nurse made aware. Prior to that pt room was  Very dirty and the floor sticky  An EVS staff was sent to clean the floor and pt yell at her to get out of the room. Pt very non compliance with care. MD again notified to come talk to pt.Marland Kitchen

## 2021-01-23 NOTE — Progress Notes (Signed)
Refused ? ?

## 2021-01-24 DIAGNOSIS — I639 Cerebral infarction, unspecified: Secondary | ICD-10-CM | POA: Diagnosis not present

## 2021-01-24 NOTE — Progress Notes (Addendum)
Unable to perform complete assessment on patient.Patient refusing to answer questions and participate in assessment.Patient yelled,'GET OUT OF MY ROOM!" Tried to encourage patient to take medications for tonight.Patient yelled,"JUST PUT REFUSED!LEAVE MY ROOM NOW!"

## 2021-01-24 NOTE — Progress Notes (Addendum)
2:30pm: CSW received return call from intake worker at [place] [facility] to make report - report made successfully.   12:45pm: CSW completed review of patient's duplicate chart - he is on his 8th inpatient admission in the last six months. Patient has had 9 visits to the ED in the last six months. Patient tested positive for cocaine on the following dates: 07/07/20, 07/21/20, 08/22/20, 09/25/20, 10/02/20, 10/15/20, 10/27/20 and 12/17/20.  CSW left voicemail to request APS worker at [place] [facility] return call to make report.  DTP NP requested additional psychiatric evaluation to obtain clarity on the patient's abilities to make his own decisions due to recently obtained information.  Edwin Dada, MSW, [name] Transitions of Care  Clinical Social Worker II [phone]

## 2021-01-24 NOTE — Progress Notes (Signed)
Rn made another attempt if pt will take his med and do his assesment now . Pt again refused

## 2021-01-24 NOTE — Progress Notes (Signed)
  Speech Language Pathology Treatment: Cognitive-Linquistic  Patient Details Name: [name] MRN: [id number] DOB: [birth year] Today's Date: 01/24/2021 Time: 2800-3491 SLP Time Calculation (min) (ACUTE ONLY): 16 min  Assessment / Plan / Recommendation Clinical Impression  Shorter session d/t pt compliance as he was significantly agitated with nursing staff from prior shift and perseverated on tx received during attempts to provide dysarthria/aphasia tx.  Increased volume assisted with improving speech clarity during simple words-sentences.  Groping and dysfluency noted during speaking attempts with mod verbal cues provided to slow rate and pause to gain composure to articulate words more effectively during conversational tasks with 75% intelligibility noted intermittently during session.  Pt with noted word finding deficits during communicative attempts with education attempted for compensatory strategies with pt stating "I want to go home."  ST will continue to provide ST in this setting as pt would benefit to increase overall communicative competence.   HPI HPI: 55 yr old [place] and became unresponsive with left-sided weakness and left gaze deviation. 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 Intubated 1/17-1/20. Pt s/p bilateral big toe amputations.      SLP Plan  Continue with current plan of care       Recommendations   TBD                General recommendations: Other(comment) (TBD) Plan: Continue with current plan of care                       Tressie Stalker, M.S., CCC-SLP 01/24/2021, 12:37 PM

## 2021-01-24 NOTE — Progress Notes (Addendum)
[facility] PROGRESS NOTE  [name] VHQ:[id number] DOB: 02/28/1966 DOA: 12/17/2020 PCP: [place] [place]    01/16/2021   Status: Remains inpatient appropriate because:Ongoing active pain requiring inpatient pain management, Unsafe d/c plan and IV treatments appropriate due to intensity of illness or inability to take PO   Dispo:  The patient is from: homeless              Anticipated d/c is to: [employer] [facility] pending availability of 1st floor room              Anticipated d/c date is: > 3 days              Patient currently is not medically stable to d/c.  Barriers to discharge: Patient is homeless and currently has significant gait and ambulation issues secondary to recent bilateral amputation of toes.  Because of his polysubstance abuse he has yet to receive an SNF bed offer. Safety concern over his ongoing severe dysarthria/expressive aphasia and gait disturbance/high risk for falls if discharged too quickly back to the streets.  New concerns now regarding patient's Medicaid payments re: suspected misuse of Medicaid funds (very complicated and possibly involves illegal activity).  Please see [name] note dictated for 2/24 for additional detail   Difficult to place patient Yes   Level of care: Med-Surg  Code Status: Full Family Communication: Patient only DVT prophylaxis: Lovenox Vaccination status: Does not appear to be vaccinated against Covid  Foley catheter: No  HPI: 55 year old male with past medical history of coronary artery disease, chronic systolic CHF, CVA presented with chest pain and became unresponsive with left-sided weakness and left gaze deviation.  Was intubated in the ER.  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  Extubated on 1/20 and transferred to [facility] service on 1/21.  Subjective: Came into room to evaluate patient.  Initially receptive to myself  and the team interacting.  Patient again reiterating his frustrations over staff inability to manage his care.  He was very angry pointing his fingers and at times telling staff to leave the room.  He stated that his dressings have not been changed in 5 days.  I pointed out to him that the staff attempted twice in the past 24 hours to change his dressings and he refused.  At this point he became extremely angry and agitated.  He told me that I was siding with the nursing staff.  I pointed out to him that I had made several attempts to address his concerns regarding toileting at night.  1 of those attempts was to try to arrange for safety sitter who could assist him to the bathroom at night.  Unfortunately due to staffing issues this need was unable to be met since he was not a harm to himself or others.  He explained that he was going to leave AMA and go to another hospital.  I explained to him that unfortunately nationwide including in this area there continues to be a nursing shortage and to anticipate the same level of nursing care that he is receiving here at other facilities.  Again he stated that I was siding with the nursing staff.  Multiple times I attempted to explain to the patient that instead of arranging for a safety sitter our plan was to allow him to get out of bed independently and instead of mobilizing to the bathroom plan was to keep a walker and  bedside commode at the bed with call light in reach so he could toilet himself without assistance.  In regards to the wound care it is felt that this point with staples out even with wounds bleeding that it would be appropriate to leave dressings off to allow for wounds to develop eschars.  Again patient refused to listen to my explanation.  He was willing to allow the registered nurse at the bedside to provide care.  When I explained to him I at least need to listen to his lungs and do a basic physical exam he refused to allow me to do this stating "you  will not touch me now or ever again".  Asked patient if he wished another provider to manage his care and he stated "yes I do not ever want you in this room again."  Explained to patient that I would allow for the attending physician to assume his care from this point on.  Secure chat message sent to Dr. Florene Glen notifying him of need to assume full care of this patient beginning on 2/25.  Objective: Vitals:   01/22/21 1934 01/23/21 1058  BP: 130/70 (!) 154/85  Pulse: 84 80  Resp: 18 18  Temp:  98 F (36.7 C)  SpO2: 98% 99%    Intake/Output Summary (Last 24 hours) at 01/24/2021 0743 Last data filed at 01/24/2021 0416 Gross per 24 hour  Intake 120 ml  Output 1250 ml  Net -1130 ml   Filed Weights   01/17/21 0330 01/20/21 0500 01/23/21 0314  Weight: 81 kg 74.1 kg 73.9 kg    Exam: **Unable to complete any physical exam due to patient refusal the reason for incomplete data** Constitutional: Alert, initially calm but became extremely agitated during my interaction Respiratory:  Cardiovascular:  Abdomen:  Skin: I am able to visualize dressings over both of his feet with some blood spotting at the amputation site Neurologic: Limited exam.  Patient continues with dystonic movements.  They have improved.  Continues with dysarthric speech and difficulty expressing self and it takes much time to allow patient to perform worse Psychiatric: Initially was appropriate upon her entry into the room but during my interaction he felt like I was not listening to him and I had sided with the nursing staff therefore became extremely agitated and angry.  Alert and oriented times name and place   Assessment/Plan: Acute problems: Acute ischemic stroke-right superior and middle cerebellar peduncle -Reviewed CT head and MRI brain and MRA head and neck -EEG shows diffuse encephalopathy -Continue statin; Continue aspirin and Plavix for 3 weeks (LD due 2/11) and then aspirin alone as per neurology's  recommendation -1/25: Due to persistent right-sided weakness repeated CT head, MRI and EEG with no acute findings. -A1c: 6.4%, LDL: 72, echo shows ejection fraction less than 20% with grade 2 diastolic dysfunction -Neurology signed off -PT/OT recommended SNF but with patient's polysubstance abuse history he has not received any bed offers -PT continues to recommend SNF.  At this juncture best option would be to focus on more aggressive therapy until patient able to manage independently in long-term hotel room as described above. -Repeat MRI on 2/8 as below-no acute infarct-suspect dysarthria and dystonia secondary to side effects from Lyrica as described below -2/17 reconsulted SLP in regards to cognition and speech with ongoing dysarthria -Given significant communication needs and mobility needs have requested one-to-one safety sitter to assist with this.  If unable to provide sitter due to staffing can try to utilize bedside commode.  Bipolar disorder/homelessness: -Poor compliance.  Homelessness.   -Consulted psychiatry early during the hospitalization-evaluated and cleared him a danger to himself or to others.  Repeat evaluation on 2/14.  Patient had improved insight but lacked capacity to make medical decisions.  He was able to state to the psychiatric provider that he agreed to continue to stay in the hospital "I need to do what is right". -Continue Depakote -No typical antipsychotics based on history of prolonged QTC and underlying LV dysfunction  Acute dysarthria with dystonia -MRI negative for acute changes -Symptoms resolved after discontinuation of Lyrica -Has mild residual dystonia likely related to recent cerebellar stroke -Typically antipsychotics are used to treat dystonia (ballisma) but unable to use 2/2 prolonged qtc-can use AEDs and pt currently on Keppra and Depakote to treat seizure d/o and Bipolar d/o. -2/23 will increase Depakote to 750 mg daily given increase in mood  swings  Bilateral great toe ulcers/osteomyelitis noted in left/status post multiple toe amputations: -S/p bilateral great toe amputation on 2/2 via podiatry -2/4 resumed DVT prophylaxis with Lovenox -Discussed with TOC pharmacist changed OxyContin to long-acting morphine.  Preauthorization forms completed and have been faxed. -Continue Cipro for a total of 2 weeks with last date 2/19 - podiatrist remove staples on 2/18 -2/22 reporting significant burning pain in both feet.  Had significant dystonia related to Lyrica.  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  Hydralazine was also discontinued.  Continue Entresto; continue Aldactone -Strict I/O's and daily weight -Follow-up with cardiology outpatient-appreciate cardiology's recommendation  Suspected Seizures: -EEG from 1/17 w/ diffuse encephalopathy, nonspecific etiology.  EEG from 1/19 showed evidence of epileptogenicity arising from right more than left frontal region. -EEG on 1/26 shows no seizures or epileptiform discharges were seen throughout the recording. -Continue Keppra 500 mg twice daily as per neurology's recommendations  Coronary artery disease: -Cardiology recommended outpatient cath at least 1 month after recovery from stroke. -Continue aspirin and atorvastatin  Physical deconditioning/gait instability -Last PT evaluation was on 2/3 prior to adjustments in pain medications.  Patient was having significant difficulty with mobility secondary to pain has now  improved with adjustments in narcotics -We will order rolling walker and wheelchair noting patient is at high risk for falls although he is making progress needing only minimal assist to come to stand with rolling walker and minimal to mod assist x2 to stand step to the right with a rolling walker -Plan is to remain inpatient until gait and ambulation maximized -Safety sitter requested as above    Other problems: Acute metabolic/toxic encephalopathy: -Multifactorial in the setting of stroke, drug use, respiratory failure and sedation -Resolved -Continue folic acid, thiamine and multivitamin -Seen by psychiatry-appreciate help  Hypertension:  Blood pressure 140s/100s-likely secondary to pain with significant improvement in hypertension with improved pain control as above. -Monitor blood pressure closely  Acute transaminitis -LFTs as of 2/3 are within normal limits except for low albumin of 3.1 -Elevated LFTs likely related to shock liver phases of admission the mild elevation of total bilirubin to 1.7 therefore not consistent with acute liver failure  Oral pain w/ upper palate  edema -Orthopantogram unremarkable -As of 2/10 is resolved  Cocaine use:  Counseled about cessation  Hyponatremia:  Resolved  Bipolar disorder/homelessness: -Poor compliance.  Homelessness.   Consulted psychiatry-evaluated and cleared him a danger to himself or to others. -Continue Depakote  Hyperlipidemia:  Continue statin.    Data Reviewed: Basic Metabolic Panel: No results for input(s): NA, K, CL, CO2, GLUCOSE, BUN, CREATININE, CALCIUM, MG, PHOS in the last 168 hours. Liver Function Tests: No results for input(s): AST, ALT, ALKPHOS, BILITOT, PROT, ALBUMIN in the last 168 hours. No results for input(s): LIPASE, AMYLASE in the last 168 hours. No results for input(s): AMMONIA in the last 168 hours. CBC: No results for input(s): WBC, NEUTROABS, HGB, HCT, MCV, PLT in the last 168  hours. Cardiac Enzymes: No results for input(s): CKTOTAL, CKMB, CKMBINDEX, TROPONINI in the last 168 hours. BNP (last 3 results) No results for input(s): BNP in the last 8760 hours.  ProBNP (last 3 results) No results for input(s): PROBNP in the last 8760 hours.  CBG: No results for input(s): GLUCAP in the last 168 hours.  No results found for this or any previous visit (from the past 240 hour(s)).   Studies: No results found.  Scheduled Meds: . (feeding supplement) PROSource Plus  30 mL Oral BID BM  . aspirin  81 mg Oral Daily  . atorvastatin  40 mg Oral Daily  . carvedilol  6.25 mg Oral BID WC  . divalproex  750 mg Oral Daily  . enoxaparin (LOVENOX) injection  40 mg Subcutaneous Q24H  . feeding supplement  237 mL Oral BID BM  . folic acid  1 mg Oral Daily  . gabapentin  100 mg Oral TID  . isosorbide dinitrate  5 mg Oral TID  . levETIRAcetam  500 mg Oral BID  . magnesium oxide  400 mg Oral BID  . methocarbamol  500 mg Oral TID  . morphine  30 mg Oral Q12H  . multivitamin with minerals  1 tablet Oral Daily  . polyethylene glycol  17 g Per Tube Daily  . sacubitril-valsartan  1 tablet Oral BID  . silver sulfADIAZINE   Topical BID  . spironolactone  12.5 mg Oral Daily  . thiamine  100 mg Oral Daily   Continuous Infusions: . lactated ringers Stopped (01/02/21 1552)    Principal Problem:   Ischemic stroke (HCC) Active Problems:   Basilar artery embolus   Acute on chronic combined systolic and diastolic CHF (congestive heart failure) (HCC)   Respiratory failure (HCC)   Ulcer of great toe, left, with necrosis of bone (HCC)   Ulcer of great toe, right, with necrosis of muscle (HCC)   Acute osteomyelitis of toe, left (HCC)   Osteomyelitis of great toe (HCC)   Shock liver   Acute metabolic encephalopathy   CAD (coronary artery disease)   HTN (hypertension)   Bipolar I disorder ([place])   Cocaine abuse ([place])   Homelessness   HLD (hyperlipidemia)   Dystonia    Dysarthria   Pain, dental   Consultants:  Neurology  Cardiology  Podiatry  Palliative  Procedures: Bilateral great toe amputation  Antibiotics: Anti-infectives (From admission, onward)   Start     Dose/Rate Route Frequency Ordered Stop   01/14/21 0000  ciprofloxacin (CIPRO) 500 MG tablet  Status:  Discontinued        500 mg Oral 2 times daily 01/14/21 1223 01/15/21    01/05/21 1145  ciprofloxacin (CIPRO) tablet 500 mg  500 mg Oral 2 times daily 01/05/21 1057 01/19/21 0759   01/02/21 1406  vancomycin (VANCOCIN) powder  Status:  Discontinued          As needed 01/02/21 1406 01/02/21 1428   12/17/20 1450  ceFAZolin (ANCEF) 2-4 GM/100ML-% IVPB       Note to Pharmacy: Domenick Bookbinder   : cabinet override      12/17/20 1450 12/17/20 1911       Time spent: 35 minutes    Erin Hearing ANP  [facility] 7 am - 330 pm/M-F for direct patient care and secure chat Please refer to Amion for contact info 38  days

## 2021-01-24 NOTE — Progress Notes (Signed)
RD Sign-Off Note  Admitting Dx: Stroke (HCC) [I63.9] Basilar artery embolus [I65.1]  Pt pending d/c which continues to be difficult. Per NP, there are now new concerns regarding patient's Medicaid payments re: suspected misuse of Medicaid funds (very complicated and possibly involves illegal activity).  Please see [name] note dictated for 2/24 for additional details.  Note pt has been refusing examinations, treatments, dressing changes, labs, etc. Additionally, pt has been agitated with multiple staff members and has yelled at some providers. Pt with orders for 47ml Prosource BID and Ensure BID. Per RN, pt intermittently refusing Prosource but does well with Ensure. Recommend continuing current nutrition plan of care.   Wt Readings from Last 15 Encounters:  01/23/21 73.9 kg    Body mass index is 24.06 kg/m. Patient meets criteria for normal based on current BMI.   Current diet order is 2gm sodium, patient is consuming approximately 80-100% of meals at this time. Labs and medications reviewed.   No additional nutrition interventions warranted at this time. If nutrition issues arise, please consult RD.   Eugene Gavia, MS, RD, LDN RD pager number and weekend/on-call pager number located in [place].

## 2021-01-24 NOTE — Progress Notes (Deleted)
Cardiology Clinic Note   Patient Name: [name] Date of Encounter: 01/24/2021  Primary Care Provider:  [name] Primary Cardiologist:  Rollene Rotunda, MD  Patient Profile    [name] 55 year old male presents the clinic today for follow-up evaluation of his chronic systolic heart failure and coronary artery disease    Past Medical History    No past medical history on file. Past Surgical History:  Procedure Laterality Date  . 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 IR ANGIO INTRA EXTRACRAN SEL COM CAROTID INNOMINATE BILAT MOD SED  12/17/2020  . IR ANGIO VERTEBRAL SEL VERTEBRAL BILAT MOD SED  12/17/2020  . RADIOLOGY WITH ANESTHESIA N/A 12/17/2020   Procedure: IR WITH ANESTHESIA;  Surgeon: Radiologist, Medication, MD;  Location: [facility] OR;  Service: Radiology;  Laterality: N/A;    Allergies  Allergies  Allergen Reactions  . Ibuprofen Anaphylaxis  . Latex Other (See Comments)    Reaction??  Roselee Nova Cr [Pregabalin Er] Other (See Comments)    Dystonia and dysarthria  . Peanut-Containing Drug Products Other (See Comments)    Allergic to peanut butter (reaction??)  . Tylenol [Acetaminophen] Hives and Nausea And Vomiting    Per pt he can't have it because it will kill him.    History of Present Illness    Mr. Chiang is a PMH of coronary artery disease, chronic systolic CHF, polysubstance abuse, and CVA.  Cardiology was consulted while he was in hospital 12/31/2020.  We were asked to consult for preoperative cardiovascular risk.  Due to his coronary artery disease and CHF he was considered moderate risk for and intermediate risk surgery involving toe amputation.  He was euvolemic with no signs of fluid volume overload and no angina.  He was in sinus rhythm at the time.  Permission was given to hold  aspirin prior to surgery.  It was recommended that Plavix be continued due to her recent CVA and underlying coronary artery disease.  His echocardiogram showed a reduction in his ejection fraction from 40-45% to less than 20%, no evidence of LV thrombus.  He underwent cardiac catheterization 11/20 which showed nonobstructive CAD, underwent coronary CTA 04/25/2020 which was concerning for multivessel obstructive CAD.  Cardiac catheterization was recommended 1 month after recovery from CVA.  He presents the clinic today for follow-up evaluation states***  *** denies chest pain, shortness of breath, lower extremity edema, fatigue, palpitations, melena, hematuria, hemoptysis, diaphoresis, weakness, presyncope, syncope, orthopnea, and PND.   Home Medications    Prior to Admission medications   Medication Sig Start Date End Date Taking? Authorizing Provider  acetaminophen (TYLENOL) 325 MG tablet Take 2 tablets (650 mg total) by mouth every 4 (four) hours as needed for mild pain (or temp > 37.5 C (99.5 F)). 01/14/21   Russella Dar, NP  aspirin 81 MG chewable tablet Chew 1 tablet (81 mg total) by mouth daily. 01/15/21   Russella Dar, NP  atorvastatin (LIPITOR) 40 MG tablet Take 40 mg by mouth daily.    [provider]  carvedilol (COREG) 3.125 MG tablet Take 3.125 mg by mouth 2 (two) times daily with a meal.    [provider]  cloNIDine (CATAPRES) 0.1 MG tablet Take 0.1 mg by mouth 3 (three) times daily.    [provider]  clopidogrel (PLAVIX) 75 MG tablet  Take 75 mg by mouth daily.    [provider]  divalproex (DEPAKOTE ER) 500 MG 24 hr tablet Take 500 mg by mouth in the morning and at bedtime.    [provider]  doxycycline (VIBRA-TABS) 100 MG tablet Take 100 mg by mouth every 12 (twelve) hours. FOR 5 DAYS    [provider]  furosemide (LASIX) 40 MG tablet Take 40 mg by mouth daily.    [provider]  hydrALAZINE (APRESOLINE) 50  MG tablet Take 50 mg by mouth every 8 (eight) hours.    [provider]  isosorbide dinitrate (ISORDIL) 5 MG tablet Take 1 tablet (5 mg total) by mouth 3 (three) times daily. 01/14/21   Russella Dar, NP  levETIRAcetam (KEPPRA) 500 MG tablet Take 1 tablet (500 mg total) by mouth 2 (two) times daily. 01/14/21   Russella Dar, NP  lisinopril (ZESTRIL) 20 MG tablet Take 20 mg by mouth daily.    [provider]  magnesium oxide (MAG-OX) 400 (241.3 Mg) MG tablet Take 1 tablet (400 mg total) by mouth 2 (two) times daily. 01/14/21   Russella Dar, NP  oxyCODONE (OXY IR/ROXICODONE) 5 MG immediate release tablet Take 1 tablet (5 mg total) by mouth every 4 (four) hours as needed for severe pain. 01/14/21   Russella Dar, NP  oxyCODONE 30 MG 12 hr tablet Take 1 tablet (30 mg total) by mouth every 12 (twelve) hours. 01/15/21   Russella Dar, NP  predniSONE (DELTASONE) 10 MG tablet Take 10 mg by mouth daily with breakfast. FOR 3 DAYS    [provider]  sacubitril-valsartan (ENTRESTO) 24-26 MG Take 1 tablet by mouth 2 (two) times daily. 01/14/21   Russella Dar, NP  spironolactone (ALDACTONE) 25 MG tablet Take 0.5 tablets (12.5 mg total) by mouth daily. 01/15/21   Russella Dar, NP    Family History    No family history on file. has no family status information on file.   Social History    Social History   Socioeconomic History  . Marital status: Single    Spouse name: Not on file  . Number of children: 4  . Years of education: Not on file  . Highest education level: Not on file  Occupational History  . Not on file  Tobacco Use  . Smoking status: Current Every Day Smoker    Types: Cigarettes  . Smokeless tobacco: Never Used  Vaping Use  . Vaping Use: Never used  Substance and Sexual Activity  . Alcohol use: Yes  . Drug use: Yes    Types: "Crack" cocaine  . Sexual activity: Not Currently    Partners: Female  Other Topics Concern  . Not on file   Social History Narrative  . Not on file   Social Determinants of Health   Financial Resource Strain: Not on file  Food Insecurity: Not on file  Transportation Needs: Not on file  Physical Activity: Not on file  Stress: Not on file  Social Connections: Not on file  Intimate Partner Violence: Not on file     Review of Systems    General:  No chills, fever, night sweats or weight changes.  Cardiovascular:  No chest pain, dyspnea on exertion, edema, orthopnea, palpitations, paroxysmal nocturnal dyspnea. Dermatological: No rash, lesions/masses Respiratory: No cough, dyspnea Urologic: No hematuria, dysuria Abdominal:   No nausea, vomiting, diarrhea, bright red blood per rectum, melena, or hematemesis Neurologic:  No visual changes, wkns,  changes in mental status. All other systems reviewed and are otherwise negative except as noted above.  Physical Exam    VS:  There were no vitals taken for this visit. , BMI There is no height or weight on file to calculate BMI. GEN: Well nourished, well developed, in no acute distress. HEENT: normal. Neck: Supple, no JVD, carotid bruits, or masses. Cardiac: RRR, no murmurs, rubs, or gallops. No clubbing, cyanosis, edema.  Radials/DP/PT 2+ and equal bilaterally.  Respiratory:  Respirations regular and unlabored, clear to auscultation bilaterally. GI: Soft, nontender, nondistended, BS + x 4. MS: no deformity or atrophy. Skin: warm and dry, no rash. Neuro:  Strength and sensation are intact. Psych: Normal affect.  Accessory Clinical Findings    Recent Labs: 12/17/2020: TSH 3.172 01/03/2021: ALT 25 01/06/2021: BUN 7; Creatinine, Ser 0.89; Potassium 3.9; Sodium 135 01/07/2021: Magnesium 2.1 01/08/2021: Hemoglobin 13.9; Platelets 288   Recent Lipid Panel    Component Value Date/Time   CHOL 116 12/18/2020 0823   TRIG 52 12/19/2020 0533   HDL 26 (L) 12/18/2020 0823   CHOLHDL 4.5 12/18/2020 0823   VLDL 18 12/18/2020 0823   LDLCALC 72 12/18/2020  0823    ECG personally reviewed by me today- *** - No acute changes  Assessment & Plan   1.  ***   Thomasene Ripple. Jovahn Breit NP-C    01/24/2021, 6:59 AM [facility] [address] Office [phone] Fax [phone]  Notice: This dictation was prepared with Dragon dictation along with smaller phrase technology. Any transcriptional errors that result from this process are unintentional and may not be corrected upon review.  I spent***minutes examining this patient, reviewing medications, and using patient centered shared decision making involving her cardiac care.  Prior to her visit I spent greater than 20 minutes reviewing her past medical history,  medications, and prior cardiac tests.

## 2021-01-24 NOTE — Progress Notes (Signed)
Physical Therapy Treatment Patient Details Name: [name] MRN: [id number] DOB: [birth year] Today's Date: 01/24/2021    History of Present Illness Homeless 55 yo male found down with c/o CP by bystanders. 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 Extubated 1/20. S/p bil great toe amputations 01/02/21.    PT Comments    Pt making progress with mobility as he was able to come to stand, ambulate up to ~150 ft with a RW, and propel his w/c up to ~175 ft with only min guard assist today. However, pt remains limited in improving his safety with transfers and mobility as he is not very receptive to cues and feedback, easily becoming agitated. He benefits from having control of the session and doing tasks his own way. He does not apply his w/c breaks prior to transferring which places him at high risk for falls. He even needed minA for safety to squat pivot w/c > bed today due to this. Will continue to follow acutely. Current recommendations remain appropriate.   Follow Up Recommendations  SNF;Supervision/Assistance - 24 hour     Equipment Recommendations  Wheelchair (measurements PT);Wheelchair cushion (measurements PT);Rolling walker with 5" wheels    Recommendations for Other Services       Precautions / Restrictions Precautions Precautions: Fall Precaution Comments: orthostatic hypotension; pt easily agitated due to psych h/o, is very unsafe; s/p bil great toe amputation 01/02/21 Required Braces or Orthoses: Other Brace Other Brace: Bilateral post-op shoes Restrictions Weight Bearing Restrictions: Yes RLE Weight Bearing: Weight bearing as tolerated LLE Weight Bearing: Weight bearing as tolerated Other Position/Activity Restrictions: Post-op shoes present in room    Mobility  Bed Mobility Overal bed  mobility: Modified Independent Bed Mobility: Supine to Sit     Supine to sit: Modified independent (Device/Increase time)     General bed mobility comments: increased time but comes to sit from supine with bed flat.    Transfers Overall transfer level: Needs assistance Equipment used: Rolling walker (2 wheeled) Transfers: Sit to/from [employer] Sit to Stand: Min guard Stand pivot transfers: Min assist       General transfer comment: Comes to stand 1x from EOB > RW and 1x from w/c with extra time and min guard for safety as he has trunk sway but no overt LOB, but does not apply breaks prior and thus needs minA for safety to direct buttocks from w/c > EOB with squat pivot transfer. Pt unreceptive of education to lock breaks.  Ambulation/Gait Ambulation/Gait assistance: Min guard Gait Distance (Feet): 150 Feet Assistive device: Rolling walker (2 wheeled) Gait Pattern/deviations: Step-through pattern;Narrow base of support Gait velocity: reduced Gait velocity interpretation: <1.31 ft/sec, indicative of household ambulator General Gait Details: pt with slowed step-through gait, no significant LE ataxia noted, post-op shoe often falling off R foot. Cued pt to widen stance to avoid shear brushing force of shoes on amputation sites, mod success.   Stairs             Wheelchair Mobility    Modified Rankin (Stroke Patients Only) Modified Rankin (Stroke Patients Only) Pre-Morbid Rankin Score: No symptoms Modified Rankin: Moderately severe disability     Balance Overall balance assessment: Needs assistance Sitting-balance support: No upper extremity supported;Feet supported Sitting balance-Leahy Scale: Good     Standing balance support: Single extremity supported;Bilateral upper extremity supported Standing  balance-Leahy Scale: Poor Standing balance comment: Reliant on UE support on RW and minG, ataxia noted in arm and increased trunk sway when 1 UE  removed from RW.                            Cognition Arousal/Alertness: Awake/alert Behavior During Therapy: Impulsive;Agitated Overall Cognitive Status: Impaired/Different from baseline Area of Impairment: Memory;Attention;Safety/judgement;Awareness;Problem solving;Following commands                   Current Attention Level: Selective Memory: Decreased recall of precautions;Decreased short-term memory Following Commands: Follows one step commands inconsistently (more-so by choice and not ability to follow commands) Safety/Judgement: Decreased awareness of safety;Decreased awareness of deficits Awareness: Intellectual Problem Solving: Slow processing;Difficulty sequencing;Decreased initiation;Requires verbal cues;Requires tactile cues General Comments: Pt easily agitated and prefers to do tasks his way with him not very receptive of any cues or education from PT. Pt with improved safety awareness and reactional strategies though.      Exercises      General Comments General comments (skin integrity, edema, etc.): Pt propelled w/c with UEs back to room with min guard for safety in case needing to stear pt away from wall, x ~175 ft      Pertinent Vitals/Pain Pain Assessment: Faces Faces Pain Scale: Hurts a little bit Pain Location: feet Pain Descriptors / Indicators: Sore;Operative site guarding Pain Intervention(s): Limited activity within patient's tolerance;Monitored during session;Repositioned (notified RN)    Home Living                      Prior Function            PT Goals (current goals can now be found in the care plan section) Acute Rehab PT Goals Patient Stated Goal: to leave PT Goal Formulation: With patient Time For Goal Achievement: 01/30/21 Potential to Achieve Goals: Fair Progress towards PT goals: Progressing toward goals    Frequency    Min 3X/week      PT Plan Current plan remains appropriate    Co-evaluation               AM-PAC PT "6 Clicks" Mobility   Outcome Measure  Help needed turning from your back to your side while in a flat bed without using bedrails?: None Help needed moving from lying on your back to sitting on the side of a flat bed without using bedrails?: None Help needed moving to and from a bed to a chair (including a wheelchair)?: A Little Help needed standing up from a chair using your arms (e.g., wheelchair or bedside chair)?: A Little Help needed to walk in hospital room?: A Little Help needed climbing 3-5 steps with a railing? : A Lot 6 Click Score: 19    End of Session Equipment Utilized During Treatment: Gait belt Activity Tolerance: Patient tolerated treatment well;Treatment limited secondary to agitation Patient left: in bed;with call bell/phone within reach;with bed alarm set;with nursing/sitter in room Nurse Communication: Mobility status;Other (comment) (request to talk with RN; pain in feet) PT Visit Diagnosis: Unsteadiness on feet (R26.81);Other symptoms and signs involving the nervous system (R29.898);Other abnormalities of gait and mobility (R26.89);Ataxic gait (R26.0);Difficulty in walking, not elsewhere classified (R26.2)     Time: 1107-1130 PT Time Calculation (min) (ACUTE ONLY): 23 min  Charges:  $Gait Training: 8-22 mins $Therapeutic Activity: 8-22 mins  Raymond Gurney, PT, DPT Acute Rehabilitation Services  Pager: [phone] Office: [phone]    Jewel Baize 01/24/2021, 12:32 PM

## 2021-01-24 NOTE — Progress Notes (Signed)
Pt refused dressing on the day shift  Dressing needed change now.  This RN enplained to pt and he refused dressing cgange to bil. big toes wound.pt refused  dressing change  too.

## 2021-01-25 ENCOUNTER — Ambulatory Visit: Payer: Medicaid Other | Admitting: General Practice

## 2021-01-25 DIAGNOSIS — I639 Cerebral infarction, unspecified: Secondary | ICD-10-CM | POA: Diagnosis not present

## 2021-01-25 MED ORDER — LEVETIRACETAM 500 MG PO TABS
500.0000 mg | ORAL_TABLET | Freq: Two times a day (BID) | ORAL | Status: DC
  Administered 2021-01-25 – 2021-02-02 (×14): 500 mg via ORAL
  Filled 2021-01-25 (×17): qty 1

## 2021-01-25 MED ORDER — SACUBITRIL-VALSARTAN 24-26 MG PO TABS
1.0000 | ORAL_TABLET | Freq: Two times a day (BID) | ORAL | Status: DC
  Administered 2021-01-26 – 2021-02-02 (×11): 1 via ORAL
  Filled 2021-01-25 (×19): qty 1

## 2021-01-25 MED ORDER — MORPHINE SULFATE ER 15 MG PO TBCR
30.0000 mg | EXTENDED_RELEASE_TABLET | Freq: Two times a day (BID) | ORAL | Status: DC
Start: 2021-01-25 — End: 2021-01-26
  Administered 2021-01-25: 30 mg via ORAL
  Filled 2021-01-25: qty 2

## 2021-01-25 MED ORDER — MAGNESIUM OXIDE 400 (241.3 MG) MG PO TABS
400.0000 mg | ORAL_TABLET | Freq: Two times a day (BID) | ORAL | Status: DC
Start: 2021-01-25 — End: 2021-02-03
  Administered 2021-01-25 – 2021-02-02 (×12): 400 mg via ORAL
  Filled 2021-01-25 (×16): qty 1

## 2021-01-25 MED ORDER — SILVER SULFADIAZINE 1 % EX CREA: TOPICAL_CREAM | Freq: Two times a day (BID) | CUTANEOUS | Status: DC

## 2021-01-25 MED ORDER — METHOCARBAMOL 500 MG PO TABS
500.0000 mg | ORAL_TABLET | Freq: Three times a day (TID) | ORAL | Status: DC
  Administered 2021-01-25 – 2021-02-02 (×15): 500 mg via ORAL
  Filled 2021-01-25 (×21): qty 1

## 2021-01-25 NOTE — Progress Notes (Signed)
Patient upset staff in room to check vital signs and for assessment.Patient verbalized staff violating his patient rights by waking him up and asked staff to leave his room.Staff left the room.

## 2021-01-25 NOTE — Consult Note (Addendum)
Patient declined psychiatric re-evaluation. He was observed to be sitting upright on the edge of the bed, eating lunch. Immediately after entering the room he states " Who are you? You can leave?" Writer attempted multiple times to establish rapport and remind patient that I have spoken with him numerous times this hospital stay. Despite my attempts, he continued to ask me to leave. He said he was not talking to anyone except the man. When seeking clarification on the who "the man" is, he was unable to provide any details. He also was unable to provide his name or description. He presented as irritable and angry mood, affect congruent and labile. His speech was slow r/t severe dysartharia and volume increased. He is alert and oriented x3. He is unwilling to allow me to assess for his safety at this time.   Per chart review he was been refusing services to include wound care, nursing staff, medications.Marland Kitchen He participates in therapy however difficulty in progressing due to his irritability, easily agitated making it difficult to follow commands.  Patient unwilling to participate in re-evaluation of medication management and capacity. -Patient declined capacity evaluation. Previous capacity evaluation completed on 01/15/2021, patient exhibited improved insight and judgement, and decided to stay in the hospital. However he has not been able to demonstrate capacity this entire admission.  -Patient declined medication management.  -Please re-consult once patient is willing to participate in evaluation.

## 2021-01-25 NOTE — Progress Notes (Signed)
[facility] PROGRESS NOTE  [name] DTO:[id number] DOB: 10-Mar-1966 DOA: 12/17/2020 PCP: [name]    01/16/2021   Status: Remains inpatient appropriate because:Ongoing active pain requiring inpatient pain management, Unsafe d/c plan and IV treatments appropriate due to intensity of illness or inability to take PO   Dispo:  The patient is from: homeless              Anticipated d/c is to: [facility] pending availability of 1st floor room              Anticipated d/c date is: > 3 days              Patient currently is not medically stable to d/c.  Barriers to discharge: Patient is homeless and currently has significant gait and ambulation issues secondary to recent bilateral amputation of toes.  Because of his polysubstance abuse he has yet to receive an SNF bed offer. Safety concern over his ongoing severe dysarthria/expressive aphasia and gait disturbance/high risk for falls if discharged too quickly back to the streets.  Concerns re: suspected misuse of Medicaid funds.   Difficult to place patient Yes   Level of care: Med-Surg  Code Status: Full Family Communication: Patient only DVT prophylaxis: Lovenox Vaccination status: Does not appear to be vaccinated against Covid  Foley catheter: No  HPI: 55 year old male with past medical history of coronary artery disease, chronic systolic CHF, CVA presented with chest pain and became unresponsive with left-sided weakness and left gaze deviation.  Was intubated in the ER.  Underwent CT head that showed subacute to chronic left cerebellar infarct, cerebral angiogram showed [name] right superior cerebellar artery cutoff.  Extubated on 1/20 and transferred to [facility] service on 1/21.  Subjective: Somewhat limited as he's difficult to understand  Agitated again today Frustrated i'm seeing him in afternoon Continues to express frustration with night shift - sounds like he wants to be left alone especially at night He's  wanting to leave - redirected and discussed what we'd try to do to ease concerns  Objective: Vitals:   01/24/21 2334 01/25/21 0836  BP: (!) 150/89 (!) 161/103  Pulse: 84 93  Resp: 20 20  Temp:  98 F (36.7 C)  SpO2: 98% 100%    Intake/Output Summary (Last 24 hours) at 01/25/2021 1955 Last data filed at 01/25/2021 1230 Gross per 24 hour  Intake --  Output 300 ml  Net -300 ml   Filed Weights   01/17/21 0330 01/20/21 0500 01/23/21 0314  Weight: 81 kg 74.1 kg 73.9 kg    Exam:  General: No acute distress. Agitated, angry at times. Exaggerated gestures, often almost threatening gestures (pointing aggressively) Cardiovascular: deferred Lungs: unlabored Abdomen:deferred Neurological: Alert, unable to assess orientation.  Moving all extremities.  Dysarthric, expressive aphasia? Extremities: s/p amputations toe Psychiatric: Insight and judgment are appear impaired, he's angry, frustated.  Insight appears limited.  I suspect he has limited decision making capacity, though his ability to participate in evaluation of this is limited due to his refusal to engage with care and difficulty understanding him.   Assessment/Plan: Acute problems: Acute ischemic stroke-right superior and middle cerebellar peduncle -Reviewed CT head and MRI brain and MRA head and neck -EEG shows diffuse encephalopathy -Continue statin; Continue aspirin and Plavix for 3 weeks (LD due 2/11) and then aspirin alone as per neurology's recommendation.   -1/25: Due to persistent right-sided weakness repeated CT head, MRI and EEG with no acute findings. -A1c: 6.4%, LDL: 72, echo  shows ejection fraction less than 20% with grade 2 diastolic dysfunction -Neurology signed off -PT/OT recommended SNF but with patient's polysubstance abuse history he has not received any bed offers -PT continues to recommend SNF.  At this juncture best option would be to focus on more aggressive therapy until patient able to manage  independently in long-term hotel room as described above. -Repeat MRI on 2/8 as below-no acute infarct-suspect dysarthria and dystonia secondary to side effects from Lyrica as described below -2/17 reconsulted SLP in regards to cognition and speech with ongoing dysarthria -Given significant communication needs and mobility needs have requested one-to-one safety sitter to assist with this.    Bipolar disorder/homelessness  Decision Making Capacity: -Poor compliance.  Homelessness.   -Consulted psychiatry early during the hospitalization-evaluated and cleared him Ahava Kissoon danger to himself or to others.  Repeat evaluation on 2/14.  Patient had improved insight but lacked capacity to make medical decisions.  He was able to state to the psychiatric provider that he agreed to continue to stay in the hospital "I need to do what is right". - he refused to participate with psych evaluation today - I've asked them to reevaluate tomorrow.  He appears to lack insight and decision making capacity with regards to medical decisions, but difficult as he's not participating with care/evaluations.  Occasionally is threatening in speech and behavior.  Recruitment consultant.   -Continue Depakote -No typical antipsychotics based on history of prolonged QTC and underlying LV dysfunction  Acute dysarthria with dystonia -MRI negative for acute changes -Symptoms resolved after discontinuation of Lyrica -Has mild residual dystonia likely related to recent cerebellar stroke -Typically antipsychotics are used to treat dystonia (ballisma) but unable to use 2/2 prolonged qtc-can use AEDs and pt currently on Keppra and Depakote to treat seizure d/o and Bipolar d/o. -2/23 will increase Depakote to 750 mg daily given increase in mood swings  Bilateral great toe ulcers/osteomyelitis noted in left/status post multiple toe amputations: -S/p bilateral great toe amputation on 2/2 via podiatry -2/4 resumed DVT prophylaxis with  Lovenox -Discussed with TOC pharmacist changed OxyContin to long-acting morphine.  Preauthorization forms completed and have been faxed. -Continue Cipro for [name] Som total of 2 weeks with last date 2/19 - podiatrist remove staples on 2/18 -2/22 reporting significant burning pain in both feet.  Had significant dystonia related to Lyrica.  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  Hydralazine was also discontinued.  Continue Entresto; continue Aldactone -Strict I/O's and daily weight -Follow-up with cardiology outpatient-appreciate cardiology's recommendation  Suspected Seizures: -EEG from 1/17 w/ diffuse encephalopathy, nonspecific etiology.  EEG from 1/19 showed evidence of epileptogenicity arising from right more than left frontal region. -EEG on 1/26 shows no seizures or epileptiform discharges were seen throughout the recording. -Continue Keppra 500 mg twice daily as per neurology's recommendations  Coronary artery disease: -Cardiology recommended outpatient cath at least 1 month after recovery from stroke. -Continue aspirin and atorvastatin  Physical deconditioning/gait instability -Last PT evaluation was on 2/3 prior to adjustments in pain medications.  Patient was having significant difficulty with mobility secondary to pain has now improved  with adjustments in narcotics -We will order rolling walker and wheelchair noting patient is at high risk for falls although he is making progress needing only minimal assist to come to  stand with rolling walker and minimal to mod assist x2 to stand step to the right with [name] rolling walker -Plan is to remain inpatient until gait and ambulation maximized -Safety sitter requested as above  Other problems: Acute metabolic/toxic encephalopathy: -Multifactorial in the setting of stroke, drug use, respiratory failure and sedation -Resolved -Continue folic acid, thiamine and multivitamin -Seen by psychiatry-appreciate help  Hypertension:  -continue meds - he's been refusing medications -Monitor blood pressure closely  Acute transaminitis -LFTs as of 2/3 are within normal limits except for low albumin of 3.1 -Elevated LFTs likely related to shock liver phases of admission the mild elevation of total bilirubin to 1.7 therefore not consistent with acute liver failure  Oral pain w/ upper palate edema -Orthopantogram unremarkable -As of 2/10 is resolved  Cocaine use:  Counseled about cessation  Hyponatremia:  Resolved  Bipolar disorder/homelessness: -Poor compliance.  Homelessness.   Consulted psychiatry-evaluated and cleared him [name] danger to himself or to others. -Continue Depakote  Hyperlipidemia:  Continue statin.  Data Reviewed: Basic Metabolic Panel: No results for input(s): NA, K, CL, CO2, GLUCOSE, BUN, CREATININE, CALCIUM, MG, PHOS in the last 168 hours. Liver Function Tests: No results for input(s): AST, ALT, ALKPHOS, BILITOT, PROT, ALBUMIN in the last 168 hours. No results for input(s): LIPASE, AMYLASE in the last 168 hours. No results for input(s): AMMONIA in the last 168 hours. CBC: No results for input(s): WBC, NEUTROABS, HGB, HCT, MCV, PLT in the last 168 hours. Cardiac Enzymes: No results for input(s): CKTOTAL, CKMB, CKMBINDEX, TROPONINI in the last 168 hours. BNP (last 3 results) No results for input(s): BNP in the last 8760 hours.  ProBNP (last 3 results) No results for input(s): PROBNP in the last 8760 hours.  CBG: No results for  input(s): GLUCAP in the last 168 hours.  No results found for this or any previous visit (from the past 240 hour(s)).   Studies: No results found.  Scheduled Meds: . (feeding supplement) PROSource Plus  30 mL Oral BID BM  . aspirin  81 mg Oral Daily  . atorvastatin  40 mg Oral Daily  . carvedilol  6.25 mg Oral BID WC  . divalproex  750 mg Oral Daily  . enoxaparin (LOVENOX) injection  40 mg Subcutaneous Q24H  . feeding supplement  237 mL Oral BID BM  . folic acid  1 mg Oral Daily  . gabapentin  100 mg Oral TID  . isosorbide dinitrate  5 mg Oral TID  . levETIRAcetam  500 mg Oral BID WC  . magnesium oxide  400 mg Oral BID WC  . methocarbamol  500 mg Oral TID WC  . morphine  30 mg Oral BID WC  . multivitamin with minerals  1 tablet Oral Daily  . polyethylene glycol  17 g Per Tube Daily  . sacubitril-valsartan  1 tablet Oral BID WC  . silver sulfADIAZINE   Topical BID WC  . spironolactone  12.5 mg Oral Daily  . thiamine  100 mg Oral Daily   Continuous Infusions: . lactated ringers Stopped (01/02/21 1552)    Principal Problem:   Ischemic stroke (HCC) Active Problems:   Basilar artery embolus   Acute on chronic combined systolic and diastolic CHF (congestive heart failure) (HCC)   Respiratory failure (HCC)   Ulcer of great toe, left,  with necrosis of bone (HCC)   Ulcer of great toe, right, with necrosis of muscle (HCC)   Acute osteomyelitis of toe, left (HCC)   Osteomyelitis of great toe (HCC)   Shock liver   Acute metabolic encephalopathy   CAD (coronary artery disease)   HTN (hypertension)   Bipolar I disorder (HCC)   Cocaine abuse (HCC)   Homelessness   HLD (hyperlipidemia)   Dystonia   Dysarthria   Pain, dental   Consultants:  Neurology  Cardiology  Podiatry  Palliative  Procedures: Bilateral great toe amputation  Antibiotics: Anti-infectives (From admission, onward)   Start     Dose/Rate Route Frequency Ordered Stop   01/14/21 0000  ciprofloxacin  (CIPRO) 500 MG tablet  Status:  Discontinued        500 mg Oral 2 times daily 01/14/21 1223 01/15/21    01/05/21 1145  ciprofloxacin (CIPRO) tablet 500 mg        500 mg Oral 2 times daily 01/05/21 1057 01/19/21 0759   01/02/21 1406  vancomycin (VANCOCIN) powder  Status:  Discontinued          As needed 01/02/21 1406 01/02/21 1428   12/17/20 1450  ceFAZolin (ANCEF) 2-4 GM/100ML-% IVPB       Note to Pharmacy: Cristy Friedlander   : cabinet override      12/17/20 1450 12/17/20 1911       Time spent: 35 minutes    Lacretia Nicks ANP  [facility] 7 am - 330 pm/M-F for direct patient care and secure chat Please refer to Amion for contact info 39  days

## 2021-01-25 NOTE — Progress Notes (Signed)
Upon entering patient room patient stated,"What do you want now?"Patient refusing assessment does verbalized having some discomfort.Offered patient pain medication but patient refusing.Patient asked this nurse to leave his room.

## 2021-01-26 DIAGNOSIS — I639 Cerebral infarction, unspecified: Secondary | ICD-10-CM | POA: Diagnosis not present

## 2021-01-26 MED ORDER — GABAPENTIN 300 MG PO CAPS
300.0000 mg | ORAL_CAPSULE | Freq: Three times a day (TID) | ORAL | Status: DC
  Administered 2021-01-26 – 2021-01-28 (×4): 300 mg via ORAL
  Filled 2021-01-26 (×7): qty 1

## 2021-01-26 MED ORDER — MORPHINE SULFATE ER 15 MG PO TBCR
30.0000 mg | EXTENDED_RELEASE_TABLET | ORAL | Status: AC
  Administered 2021-01-26: 30 mg via ORAL
  Filled 2021-01-26: qty 2

## 2021-01-26 MED ORDER — MORPHINE SULFATE ER 15 MG PO TBCR
30.0000 mg | EXTENDED_RELEASE_TABLET | Freq: Two times a day (BID) | ORAL | Status: DC
  Administered 2021-01-26 – 2021-02-03 (×14): 30 mg via ORAL
  Filled 2021-01-26 (×16): qty 2

## 2021-01-26 NOTE — Progress Notes (Signed)
Patient [name] refused vital signs and focused assessments. Patient stated "get that out of here" when explaining need to do vital signs and assessments. Patient has now turned over in the bed to rest.  Will attempt to take vital signs with 2200 medications later in the night.

## 2021-01-26 NOTE — Progress Notes (Signed)
At 2010, walked into patient's room to explain need for assessments and explained meds to give for the night. Patient states "No. No. I want to be left alone. I don't want meds. Turn the light off and shut the door on your way out. Do not come in here unless I press the call button. Thank you."   Will continue to do hourly rounding, but otherwise with allow the patient to be left alone.

## 2021-01-26 NOTE — Progress Notes (Signed)
Patient refusing all assessments, becomes agitated upon nurse entering the room, says "No. No. No. I don't want to see you. Go away. Go." Will continue to round to check on the patient.

## 2021-01-26 NOTE — Consult Note (Signed)
[facility] Consult   Reason for Consult: Evaluate capacity for making medical decisions.  Referring Physician:  Florene Glen Patient Identification: [name] MRN:  [id number] Principal Diagnosis: Ischemic stroke [facility]) Diagnosis:  Principal Problem:   Ischemic stroke [facility]) Active Problems:   Basilar artery embolus   Acute on chronic combined systolic and diastolic CHF (congestive heart failure) (HCC)   Respiratory failure (HCC)   Ulcer of great toe, left, with necrosis of bone (HCC)   Ulcer of great toe, right, with necrosis of muscle ([place])   Acute osteomyelitis of toe, left (HCC)   Osteomyelitis of great toe (HCC)   Shock liver   Acute metabolic encephalopathy   CAD (coronary artery disease)   HTN (hypertension)   Bipolar I disorder ([place])   Cocaine abuse ([place])   Homelessness   HLD (hyperlipidemia)   Dystonia   Dysarthria   Pain, dental   Total Time spent with patient: 30 minutes  Subjective:   [name] is a 55 y.o. male patient admitted on 12/17/2020.  The patient presented to the emergency department from a homeless shelter.  His friends at the site stated he had complained of chest pain, and when he arrived at the emergency department he was unconscious.  HPI: Patient is seen and examined.  Patient is a 55 year old male admitted on this particular admission on 12/17/2020.  He has a reported past medical history of congestive heart failure as well as type 2 diabetes.  He had apparently complained of chest pain followed [place]responsiveness [place] a local park.  Prior to 1234 hrs. on the date of admission he was noted to be talking and apparently acting normal.  Upon arrival in the emergency department he was essentially unresponsive but did have a gag reflex.  There was no noted movement of extremities.  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  He remained hypoglycemic after  receiving D50.  There was concern of thrombus, and an MRI or MRA was hoped to be obtained.  There was also an EEG.  The CT scan angio of the head and neck could not be completed secondary to the contrast not circulating.  There was concern for basal artery embolism.  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  The patient had been intubated prior to the procedure and that remained to protect the airway.  An EEG was obtained which was suggestive of severe diffuse encephalopathy, but it was a nonspecific etiology and could be secondary to sedation, toxic-metabolic etiology or anoxic/hypoxic brain injury.  There is no evidence of seizures or epileptiform discharges throughout the EEG.  He also had obtained a 2D echo that showed left ventricular ejection fraction by estimate was less than 20%.  The left ventricle had severely decreased ejection function.  The patient remained ventilated until 1/20 when he was extubated.  Stroke team progress note from 12/21/2020 showed that after extubation he was noted that he was unable to move his right arm, but was able to move all other extremities.  He was difficult to engage but it was thought that he had improved neurological examination on the day.  He was restarted on Depakote for mood stability on that date.  Psychiatric consultation was obtained on 12/21/2020.  It was noted that he had a significant  past psychiatric history for bipolar disorder as well as substance abuse.  His psychiatric care had been through [facility] locally.  He was noted to have a longstanding history of homelessness as well as noncompliant with treatment.  He had had apparently multiple psychiatric admissions, and had significant alcohol issues as well as cocaine use disorders.  He was noted to be irritable.  He apparently had been placed in palliative care.  On 12/21/2020 the  patient denied suicidal ideation, homicidal ideation and/or any psychotic symptoms.  It was felt that he did not require psychiatric hospitalization at that time.  It was noted on 12/22/2020 per the palliative medicine team that his friend, [name] could be his surrogate decision maker.  Attempts by palliative care to contact the number early on were futile and contacting anyone.  On 12/23/2020 that the patient was refusing care from nursing.  He was refusing medications.  He removed his IV and leads to telemetry.  He was abusive to staff.  Follow-up from cardiology on 1/24 again found the patient to be difficult to direct, and basically demanding discharge from hospital.  He denied any symptoms at that time.  Plan on 12/24/2020 was to a skilled nursing facility when a bed was available.  Occupational therapy consultation from 1/25 revealed that the patient continued to have impaired balance, impulsivity, decreased safety awareness.  He was also noted to have right-sided weakness and inattention.  A second consultation to the psychiatric service was made on 12/27/2020.  It was noted that he had difficulty with speech.  But was able to verbalize that "today is not a good day".  He denied suicidal ideation at that time.  He denied homicidal ideation at that time.  He denied any auditory or visual hallucinations.  Depakote which had been started earlier during the course of the hospitalization was Depakote ER at 750 mg p.o. daily, but compliance with this was questionable.  As of now there is not a Depakote level available, and that may be secondary to noncompliance as well as an inability to allow blood draws.  Review of his laboratories show most of the last most recent laboratories were done on 01/08/2021.  Review of the electronic medical record revealed that his behavior had basically been unchanged after that.  A palliative care note from 1/30 and at that point nursing revealed to the palliative service that the  patient become more compliant.  Unfortunately the patient appeared to be having another CVA and a code stroke was called on 12/31/2020.  His EEG was normal at that time.  He apparently did admit to having suicidal ideation at that time and was placed on a one-to-one sitter and psychiatry was reconsulted.  Another psychiatric consultation was requested on 01/01/2021.  Patient had a prolonged QTC that was noted in the evaluation as well as his ejection fraction of less than 20%.  It was felt that the patient was be unable to have additional psychotropic medications.  During that examination as well he denied suicidal ideation, homicidal ideation or auditory or visual hallucinations.  He was encouraged to cooperate with staff and to attempt to be less defensive and irritable.  On 01/02/2021 the patient was seen by podiatry secondary to osteomyelitis, and ulcers of the great toes.  Bilateral first great toe amputation was completed on approximately 01/02/2021.  The hospitalist note from 01/04/2021 showed that the patient remained inpatient secondary to ongoing active pain requiring inpatient pain management as well as unsafe discharge plan.  The disposition plan at that time was to discharge to a homeless shelter, and the patient had a plan to go to [place] where he would be living with "friends".  On 01/07/2021 the anticipated discharge date was greater than 3 days.  There had been no change in the barriers to discharge.  He was homeless, had significant gait and ambulation problems.  Case management felt that a skilled nursing facility would be necessary.  On 01/07/2021 Case manager met with the patient and he declined skilled nursing.  His irritability and continued noncompliance with treatment continue throughout the course of the hospitalization.  Throughout the progress notes including 1 of 01/15/2021 his anticipated discharge continued to be to take a bus to [place] and live with friends.  On 01/19/2021 it was noted that the  patient was persistently dysarthric, high fall risk, and unsafe to discharge to a homeless shelter.  A skilled nursing facility bed was still being waited for.  Psychiatric consultation was called again for 01/25/2021.  The patient at this point refused to discuss any issues with the nurse practitioner.  We were unable to make any recommendations at that time.  Conversation with the attending physician requested that I see the patient, and I came to see the patient on the afternoon of 01/26/2021.  He is irritable.  Very uncooperative.  Difficult to assess his cognitive status due to refusing to answer most questions.  I requested with him to at least let me explain things and that my evaluation was critical to any decision that he would be able to leave the hospital on his own.  I asked him several times whether or not he understood that being discharged from the hospital could potentially lead to his death because of an inability to care for himself.  I also asked the patient what his plan was for transportation to get to the bus station and how exactly he would pay for this.  I also stated that we would need phone numbers or someway to contact his "friends" in [place] to make sure that he would have somewhere safe to go.  He was unable to truly understand that process.  I think that is more to do with his longstanding homelessness and survival mechanisms that he is used prior to his most recent complicated hospitalization.  He is irritable, but I will say this his plan for aftercare has been consistent for several weeks.  I do agree that left to his own devices and physical liabilities the potential for poor outcomes is completely understandable.  Past Psychiatric History: Patient reportedly has a history of several psychiatric hospitalizations in the past.  He has a longstanding history of cocaine use disorder/dependence as well as alcohol use disorder/dependence.  He has also been previously diagnosed with  bipolar disorder, but it is difficult to say whether or not that is true or whether it is secondary to his continued substance abuse.  Risk to Self:   Risk to Others:   Prior Inpatient Therapy:   Prior Outpatient Therapy:    Past Medical History: History reviewed. No pertinent past medical history.  Past Surgical History:  Procedure Laterality Date  . AMPUTATION TOE Bilateral 01/02/2021   Procedure: AMPUTATION TOE;  Surgeon: Evelina Bucy, DPM;  Location: [facility];  Service: Podiatry;  Laterality: Bilateral;  Ok to leave patient on hospital bed  . IR ANGIO INTRA EXTRACRAN SEL COM CAROTID INNOMINATE BILAT MOD SED  12/17/2020  . IR ANGIO VERTEBRAL SEL VERTEBRAL BILAT  MOD SED  12/17/2020  . RADIOLOGY WITH ANESTHESIA N/A 12/17/2020   Procedure: IR WITH ANESTHESIA;  Surgeon: Radiologist, Medication, MD;  Location: [facility] OR;  Service: Radiology;  Laterality: N/A;   Family History: History reviewed. No pertinent family history. Family Psychiatric  History: Unknown Social History:  Social History   Substance and Sexual Activity  Alcohol Use Yes     Social History   Substance and Sexual Activity  Drug Use Yes  . Types: "Crack" cocaine    Social History   Socioeconomic History  . Marital status: Single    Spouse name: Not on file  . Number of children: 4  . Years of education: Not on file  . Highest education level: Not on file  Occupational History  . Not on file  Tobacco Use  . Smoking status: Current Every Day Smoker    Types: Cigarettes  . Smokeless tobacco: Never Used  Vaping Use  . Vaping Use: Never used  Substance and Sexual Activity  . Alcohol use: Yes  . Drug use: Yes    Types: "Crack" cocaine  . Sexual activity: Not Currently    Partners: Female  Other Topics Concern  . Not on file  Social History Narrative  . Not on file   Social Determinants of Health   Financial Resource Strain: Not on file  Food Insecurity: Not on file  Transportation Needs: Not on file   Physical Activity: Not on file  Stress: Not on file  Social Connections: Not on file   Additional Social History:    Allergies:   Allergies  Allergen Reactions  . Ibuprofen Anaphylaxis  . Latex Other (See Comments)    Reaction??  Roselee Nova Cr [Pregabalin Er] Other (See Comments)    Dystonia and dysarthria  . Peanut-Containing Drug Products Other (See Comments)    Allergic to peanut butter (reaction??)  . Tylenol [Acetaminophen] Hives and Nausea And Vomiting    Per pt he can't have it because it will kill him.    Labs: No results found for this or any previous visit (from the past 48 hour(s)).  Current Facility-Administered Medications  Medication Dose Route Frequency Provider Last Rate Last Admin  . (feeding supplement) PROSource Plus liquid 30 mL  30 mL Oral BID BM Park Liter, DPM   30 mL at 01/20/21 1118  . acetaminophen (TYLENOL) tablet 650 mg  650 mg Oral Q4H PRN Park Liter, DPM   650 mg at 01/07/21 0426   Or  . acetaminophen (TYLENOL) 160 MG/5ML solution 650 mg  650 mg Per Tube Q4H PRN Park Liter, DPM       Or  . acetaminophen (TYLENOL) suppository 650 mg  650 mg Rectal Q4H PRN Park Liter, DPM      . aspirin chewable tablet 81 mg  81 mg Oral Daily Park Liter, DPM   81 mg at 01/26/21 9292  . atorvastatin (LIPITOR) tablet 40 mg  40 mg Oral Daily Park Liter, DPM   40 mg at 01/26/21 4462  . carvedilol (COREG) tablet 6.25 mg  6.25 mg Oral BID WC Park Liter, DPM   6.25 mg at 01/26/21 8638  . divalproex (DEPAKOTE ER) 24 hr tablet 750 mg  750 mg Oral Daily Russella Dar, NP   750 mg at 01/26/21 1771  . docusate (COLACE) 50 MG/5ML liquid 100 mg  100 mg Oral Daily PRN Park Liter, DPM   100 mg at 01/06/21 2252  .  enoxaparin (LOVENOX) injection 40 mg  40 mg Subcutaneous Q24H Samella Parr, NP   40 mg at 01/17/21 1325  . feeding supplement (ENSURE ENLIVE / ENSURE PLUS) liquid 237 mL  237 mL Oral BID BM Evelina Bucy, DPM   237 mL  at 01/24/21 1419  . folic acid (FOLVITE) tablet 1 mg  1 mg Oral Daily Evelina Bucy, DPM   1 mg at 01/26/21 1610  . gabapentin (NEURONTIN) capsule 100 mg  100 mg Oral TID Samella Parr, NP   100 mg at 01/26/21 9604  . isosorbide dinitrate (ISORDIL) tablet 5 mg  5 mg Oral TID Samella Parr, NP   5 mg at 01/26/21 5409  . lactated ringers infusion   Intravenous Continuous Evelina Bucy, DPM   Stopped at 01/02/21 1552  . levETIRAcetam (KEPPRA) tablet 500 mg  500 mg Oral BID WC Elodia Florence., MD   500 mg at 01/26/21 8119  . LORazepam (ATIVAN) tablet 1 mg  1 mg Oral Q4H PRN Evelina Bucy, DPM   1 mg at 01/24/21 1148  . magnesium oxide (MAG-OX) tablet 400 mg  400 mg Oral BID WC Elodia Florence., MD   400 mg at 01/26/21 1478  . methocarbamol (ROBAXIN) tablet 500 mg  500 mg Oral TID WC Elodia Florence., MD   500 mg at 01/26/21 1211  . morphine (MS CONTIN) 12 hr tablet 30 mg  30 mg Oral Q12H Elodia Florence., MD      . multivitamin with minerals tablet 1 tablet  1 tablet Oral Daily Evelina Bucy, DPM   1 tablet at 01/26/21 2956  . ondansetron (ZOFRAN) injection 4 mg  4 mg Intravenous Q6H PRN Evelina Bucy, DPM      . oxyCODONE (Oxy IR/ROXICODONE) immediate release tablet 5 mg  5 mg Oral Q4H PRN Samella Parr, NP   5 mg at 01/24/21 1408  . phenol (CHLORASEPTIC) mouth spray 1 spray  1 spray Mouth/Throat PRN Evelina Bucy, DPM   1 spray at 01/08/21 2103  . polyethylene glycol (MIRALAX / GLYCOLAX) packet 17 g  17 g Per Tube Daily Evelina Bucy, DPM   17 g at 01/24/21 2130  . sacubitril-valsartan (ENTRESTO) 24-26 mg per tablet  1 tablet Oral BID WC Elodia Florence., MD   1 tablet at 01/26/21 [phone]  . silver sulfADIAZINE (SILVADENE) 1 % cream   Topical BID WC Elodia Florence., MD      . spironolactone (ALDACTONE) tablet 12.5 mg  12.5 mg Oral Daily Evelina Bucy, DPM   12.5 mg at 01/26/21 8469  . thiamine tablet 100 mg  100 mg Oral Daily Evelina Bucy, DPM   100 mg at 01/24/21 6295    Musculoskeletal: Strength & Muscle Tone: decreased Gait & Station: unable to stand Patient leans: N/A  Psychiatric Specialty Exam: Physical Exam Vitals and nursing note reviewed.  HENT:     Head: Normocephalic and atraumatic.  Pulmonary:     Effort: Pulmonary effort is normal.  Neurological:     Mental Status: He is alert.     Review of Systems  Blood pressure (!) 161/103, pulse 93, temperature 98 F (36.7 C), temperature source Oral, resp. rate 20, height _0  (1.753 m), weight 73.9 kg, SpO2 100 %.Body mass index is 24.06 kg/m.  General Appearance: Fairly Groomed  Eye Contact:  Fair  Speech:  Pressured  Volume:  Increased  Mood:  Irritable  Affect:  Congruent  Thought Process:  Goal Directed and Descriptions of Associations: Circumstantial  Orientation:  Negative  Thought Content:  Rumination  Suicidal Thoughts:  No  Homicidal Thoughts:  No  Memory:  Immediate;   Poor Recent;   Poor Remote;   Poor  Judgement:  Impaired  Insight:  Lacking  Psychomotor Activity:  Increased  Concentration:  Concentration: Fair and Attention Span: Fair  Recall:  Poor  Fund of Knowledge:  Fair  Language:  Fair  Akathisia:  Negative  Handed:  Right  AIMS (if indicated):     Assets:  Desire for Improvement Resilience  ADL's:  Impaired  Cognition:  Impaired,  Mild  Sleep:        Treatment Plan Summary: Daily contact with patient to assess and evaluate symptoms and progress in treatment, Medication management and Plan : Patient is seen and examined.  Patient is a 55 year old male with the above-stated past psychiatric history as well as past medical history who is seen in consultation.   Assessment: This is a very complicated and difficult situation.  The patient is clearly stated what his goals are, and that is to be discharged from the hospital.  He has stated a plan to go to [place] essentially after he was extubated.  His irritability  seems to have escalated as the hospitalization has continued.  Unfortunately despite the fact that he is unable to care for himself he does not appreciate the difficulty he would have with survival in his previous living circumstances.  By the letter of the law he does not fulfill criteria for involuntary commitment.  He is neither suicidal, homicidal or psychotic.  Making poor decisions is not necessarily reason for commitment.  I suspect the only way to have him cooperate with treatment more so would be basically to sedate him continually.  I think this would be almost impossible.  To get him to cooperate with a reasonable discharge plan or medications.  I think your only option at this point is to consult the ethics committee at the main hospital, and if they rule in your favor attempt to make him a ward of the state and have the state assigned a guardian so that you can fulfill proper medical care and a discharge plan.  From a psychiatric standpoint given his underlying medical conditions I would maximize the gabapentin.  It looks like from review of the electronic medical record that he has been on higher doses of this in the past.  I would increase his dosage to 300 mg p.o. 3 times daily today, and attempt to escalate it up to anywhere between 600 mg and 800 mg p.o. 3 times daily.  I would emphasize with the patient that this medication is for pain control and he surely will recognize the name of this medication and will hopefully be more compliant with it then he has been with the Depakote.  Diagnosis: 1.  Unspecified mood disorder with a past psychiatric history significant for reported schizoaffective disorder as well as polysubstance use disorders. 2.  Multiple medical problems including congestive heart failure, recent CVA, osteomyelitis with amputations, hyperlipidemia and most likely coronary artery disease.  Plan: 1.  Ethics committee consultation. 2.  Increase gabapentin to 300 mg p.o. 3 times  daily and escalate that dose to as much as 800 mg p.o. 3 times daily for chronic pain and mood stability. 3.  Encourage compliance with Depakote if at all possible 4.  Initiation of any SSRI type antidepressant (with the possibility of using duloxetine under the guidance of chronic pain treatment) with the hopes that it would decrease his pain and his irritability.  Starting dose of this medication would be approximately 20 mg p.o. daily and you can titrate that up to 60 mg a day without major cardiac issues. 5.  If the patient attempts to leave the facility at this point you have limited ability to stop him.  Negotiation and consultation may be the best options that you have to be able to communicate with him in his best interest.   Disposition: Patient does not meet criteria for psychiatric inpatient admission.  Sharma Covert, MD 01/26/2021 2:14 PM

## 2021-01-26 NOTE — Progress Notes (Signed)
[facility] PROGRESS NOTE  Jim Lundin NTI:[id number] DOB: Dec 07, 1965 DOA: 12/17/2020 PCP: [name]    01/16/2021   Status: Remains inpatient appropriate because:Ongoing active pain requiring inpatient pain management, Unsafe d/c plan and IV treatments appropriate due to intensity of illness or inability to take PO   Dispo:  The patient is from: homeless              Anticipated d/c is to: [facility] pending availability of 1st floor room              Anticipated d/c date is: > 3 days              Patient currently is not medically stable to d/c.  Barriers to discharge: Patient is homeless and currently has significant gait and ambulation issues secondary to recent bilateral amputation of toes.  Because of his polysubstance abuse he has yet to receive an SNF bed offer. Safety concern over his ongoing severe dysarthria/expressive aphasia and gait disturbance/high risk for falls if discharged too quickly back to the streets.  Concerns re: suspected misuse of Medicaid funds.   Difficult to place patient Yes   Level of care: Med-Surg  Code Status: Full Family Communication: Patient only DVT prophylaxis: Lovenox Vaccination status: Does not appear to be vaccinated against Covid  Foley catheter: No  HPI: 55 year old male with past medical history of coronary artery disease, chronic systolic CHF, CVA presented with chest pain and became unresponsive with left-sided weakness and left gaze deviation.  Was intubated in the ER.  Underwent CT head that showed subacute to chronic left cerebellar infarct, cerebral angiogram showed Aizik Reh right superior cerebellar artery cutoff.  Extubated on 1/20 and transferred to [facility] service on 1/21.  Subjective: Says he's going to [place], but can't say who he'll stay with or where he'll go exactly   Objective: Vitals:   01/24/21 2334 01/25/21 0836  BP: (!) 150/89 (!) 161/103  Pulse: 84 93  Resp: 20 20  SpO2: 98% 100%     Intake/Output Summary (Last 24 hours) at 01/26/2021 1806 Last data filed at 01/26/2021 0851 Gross per 24 hour  Intake 240 ml  Output 400 ml  Net -160 ml   Filed Weights   01/17/21 0330 01/20/21 0500 01/23/21 0314  Weight: 81 kg 74.1 kg 73.9 kg    Exam:  General: No acute distress. Cardiovascular: RRR. Lungs: unlabored Abdomen: Soft, nontender, nondistended  Neurological: Alert and oriented 3. Moves all extremities 4. Dysarthric speak, expressive aphasia (worse when he's agitated)  Skin: Warm and dry. No rashes or lesions. Extremities: bilateral first toe amputation   Assessment/Plan: Acute problems: Acute ischemic stroke-right superior and middle cerebellar peduncle -Reviewed CT head and MRI brain and MRA head and neck -EEG shows diffuse encephalopathy -Continue statin; Continue aspirin and Plavix for 3 weeks (LD due 2/11) and then aspirin alone as per neurology's recommendation.   -1/25: Due to persistent right-sided weakness repeated CT head, MRI and EEG with no acute findings. -A1c: 6.4%, LDL: 72, echo shows ejection fraction less than 20% with grade 2 diastolic dysfunction -Neurology signed off -PT/OT recommended SNF but with patient's polysubstance abuse history he has not received any bed offers -PT continues to recommend SNF.  At this juncture best option would be to focus on more aggressive therapy until patient able to manage independently in long-term hotel room as described above. -Repeat MRI on 2/8 as below-no acute infarct-suspect dysarthria and dystonia secondary to side effects from Lyrica as described  below -2/17 reconsulted SLP in regards to cognition and speech with ongoing dysarthria -Given significant communication needs and mobility needs have requested one-to-one safety sitter to assist with this - will continue.    Bipolar disorder/homelessness  Decision Making Capacity: -Poor compliance.  Homelessness.   -Consulted psychiatry early during the  hospitalization-evaluated and cleared him Aymar Whitfill danger to himself or to others.  Repeat evaluation on 2/14.  Patient had improved insight but lacked capacity to make medical decisions.  He was able to state to the psychiatric provider that he agreed to continue to stay in the hospital "I need to do what is right". - appreciate psychiatry consultation -> they're recommending ethics consult (likely needs guardian), gabapentin, difficult situation if he tries to leave, limited ability to stop him (see psych note), recommended negotiation and consultation -Continue Depakote -No typical antipsychotics based on history of prolonged QTC and underlying LV dysfunction  Acute dysarthria with dystonia -MRI negative for acute changes -Symptoms resolved after discontinuation of Lyrica -Has mild residual dystonia likely related to recent cerebellar stroke -Typically antipsychotics are used to treat dystonia (ballisma) but unable to use 2/2 prolonged qtc-can use AEDs and pt currently on Keppra and Depakote to treat seizure d/o and Bipolar d/o. -2/23 will increase Depakote to 750 mg daily given increase in mood swings  Bilateral great toe ulcers/osteomyelitis noted in left/status post multiple toe amputations: -S/p bilateral great toe amputation on 2/2 via podiatry -2/4 resumed DVT prophylaxis with Lovenox -Discussed with TOC pharmacist changed OxyContin to long-acting morphine.  Preauthorization forms completed and have been faxed. -Continue Cipro for Andri Prestia total of 2 weeks with last date 2/19 - podiatrist remove staples on 2/18 -2/22 reporting significant burning pain in both feet.  Had significant dystonia related to Lyrica.  Started low-dose Neurontin 100 mg 3 times daily- uptitrate -Continue MS Contin every 12 hours, OxyIR for breakthrough pain and Robaxin as adjunct of therapy  Chronic combined systolic and diastolic heart failure with ejection fraction of less than 20% and grade 2 diastolic  dysfunction: -Likely cocaine induced -Lasix discontinued on 2/10 secondary to suboptimal blood pressure readings  -Euvolemic -Continue low-dose Coreg, on 2/4 given suboptimal blood pressure reading Imdur changed to shorter acting low-dose isosorbide.  Hydralazine was also discontinued.  Continue Entresto; continue Aldactone -Strict I/O's and daily weight -Follow-up with cardiology outpatient-appreciate cardiology's recommendation  Suspected Seizures: -EEG from 1/17 w/ diffuse encephalopathy, nonspecific etiology.  EEG from 1/19 showed evidence of epileptogenicity arising from right more than left frontal region. -EEG on 1/26 shows no seizures or epileptiform discharges were seen throughout the recording. -Continue Keppra 500 mg twice daily as per neurology's recommendations  Coronary artery disease: -Cardiology recommended outpatient cath at least 1 month after recovery from stroke. -Continue aspirin and atorvastatin  Physical deconditioning/gait instability -making progress with therapy, continue -We will order rolling walker and wheelchair noting patient is at high risk for falls although he is making progress needing only minimal assist to come to stand with rolling walker and minimal to mod assist x2 to stand step to the right with Dontrell Stuck rolling walker -Plan is to remain inpatient until gait and ambulation maximized -Safety sitter requested as above  Other problems: Acute metabolic/toxic encephalopathy: -Multifactorial in the setting of stroke, drug use, respiratory failure and sedation -Resolved -Continue folic acid, thiamine and multivitamin -Seen by psychiatry-appreciate help  Hypertension:  -continue meds - he's been refusing medications -Monitor blood pressure closely  Acute transaminitis -LFTs as of 2/3 are within normal limits except for  low albumin of 3.1 -Elevated LFTs likely related to shock liver phases of admission the mild elevation of total bilirubin to 1.7  therefore not consistent with acute liver failure  Oral pain w/ upper palate edema -Orthopantogram unremarkable -As of 2/10 is resolved  Cocaine use:  Counseled about cessation  Hyponatremia:  Resolved  Bipolar disorder/homelessness: -Poor compliance.  Homelessness.   Consulted psychiatry-evaluated and cleared him Kamla Skilton danger to himself or to others. -Continue Depakote  Hyperlipidemia:  Continue statin.  Data Reviewed: Basic Metabolic Panel: No results for input(s): NA, K, CL, CO2, GLUCOSE, BUN, CREATININE, CALCIUM, MG, PHOS in the last 168 hours. Liver Function Tests: No results for input(s): AST, ALT, ALKPHOS, BILITOT, PROT, ALBUMIN in the last 168 hours. No results for input(s): LIPASE, AMYLASE in the last 168 hours. No results for input(s): AMMONIA in the last 168 hours. CBC: No results for input(s): WBC, NEUTROABS, HGB, HCT, MCV, PLT in the last 168 hours. Cardiac Enzymes: No results for input(s): CKTOTAL, CKMB, CKMBINDEX, TROPONINI in the last 168 hours. BNP (last 3 results) No results for input(s): BNP in the last 8760 hours.  ProBNP (last 3 results) No results for input(s): PROBNP in the last 8760 hours.  CBG: No results for input(s): GLUCAP in the last 168 hours.  No results found for this or any previous visit (from the past 240 hour(s)).   Studies: No results found.  Scheduled Meds: . (feeding supplement) PROSource Plus  30 mL Oral BID BM  . aspirin  81 mg Oral Daily  . atorvastatin  40 mg Oral Daily  . carvedilol  6.25 mg Oral BID WC  . divalproex  750 mg Oral Daily  . enoxaparin (LOVENOX) injection  40 mg Subcutaneous Q24H  . feeding supplement  237 mL Oral BID BM  . folic acid  1 mg Oral Daily  . gabapentin  300 mg Oral TID  . isosorbide dinitrate  5 mg Oral TID  . levETIRAcetam  500 mg Oral BID WC  . magnesium oxide  400 mg Oral BID WC  . methocarbamol  500 mg Oral TID WC  . morphine  30 mg Oral Q12H  . multivitamin with minerals  1 tablet  Oral Daily  . polyethylene glycol  17 g Per Tube Daily  . sacubitril-valsartan  1 tablet Oral BID WC  . silver sulfADIAZINE   Topical BID WC  . spironolactone  12.5 mg Oral Daily  . thiamine  100 mg Oral Daily   Continuous Infusions: . lactated ringers Stopped (01/02/21 1552)    Principal Problem:   Ischemic stroke (HCC) Active Problems:   Basilar artery embolus   Acute on chronic combined systolic and diastolic CHF (congestive heart failure) (HCC)   Respiratory failure (HCC)   Ulcer of great toe, left, with necrosis of bone (HCC)   Ulcer of great toe, right, with necrosis of muscle (HCC)   Acute osteomyelitis of toe, left (HCC)   Osteomyelitis of great toe (HCC)   Shock liver   Acute metabolic encephalopathy   CAD (coronary artery disease)   HTN (hypertension)   Bipolar I disorder (HCC)   Cocaine abuse (HCC)   Homelessness   HLD (hyperlipidemia)   Dystonia   Dysarthria   Pain, dental   Consultants:  Neurology  Cardiology  Podiatry  Palliative  Procedures: Bilateral great toe amputation  Antibiotics: Anti-infectives (From admission, onward)   Start     Dose/Rate Route Frequency Ordered Stop   01/14/21 0000  ciprofloxacin (CIPRO) 500 MG  tablet  Status:  Discontinued        500 mg Oral 2 times daily 01/14/21 1223 01/15/21    01/05/21 1145  ciprofloxacin (CIPRO) tablet 500 mg        500 mg Oral 2 times daily 01/05/21 1057 01/19/21 0759   01/02/21 1406  vancomycin (VANCOCIN) powder  Status:  Discontinued          As needed 01/02/21 1406 01/02/21 1428   12/17/20 1450  ceFAZolin (ANCEF) 2-4 GM/100ML-% IVPB       Note to Pharmacy: Cristy Friedlander   : cabinet override      12/17/20 1450 12/17/20 1911       Time spent: 35 minutes    Lacretia Nicks ANP  [facility] 7 am - 330 pm/M-F for direct patient care and secure chat Please refer to Amion for contact info 40  days

## 2021-01-27 DIAGNOSIS — I639 Cerebral infarction, unspecified: Secondary | ICD-10-CM | POA: Diagnosis not present

## 2021-01-27 MED ORDER — DOCUSATE SODIUM 100 MG PO CAPS
100.0000 mg | ORAL_CAPSULE | Freq: Every day | ORAL | Status: DC
  Administered 2021-01-27 – 2021-02-02 (×4): 100 mg via ORAL
  Filled 2021-01-27 (×6): qty 1

## 2021-01-27 NOTE — Progress Notes (Addendum)
Patient [name]  2136 Patient was heard down the hall to be loud and belligerent, cursing toward Nurse Tech when assisting to use the bathroom. I was not able to initally help due to being in another patient's room. When another RN and I approached the situation to attempt to defuse and help, the patient was found naked and states "Don"t touch me! Do not touch me! Shut the door!" while swatting at the other RN and myself.  The patient did not want any help with mobility such as physical support or aid in getting front wheel walker from behind the door. While still not desiring to be touched, Patient held onto sink, then doorknob to reach the walker. I once again attempted to get the walker for him and he said "Don't touch me, I can get it myself!" in a hostile manner.   Patient then used the walker to walk the remaining ft. To the bathroom. I witnessed him sit down on the toilet. He proceeded to express him frustration about no one coming to help when he pushed the call light for assistance to the bathroom.  I went to turn the alarm off to Mr. Gelb' room when he said "ma'am, get out of my room and shut the door. I don't need any help."  Patient did make it back to his bed and turned the lights off.   2200 - patient asked "are you going to check my vitals or not?" When stated that I need to get his medications first, he once again became agitated.     01/27/21 2200  Precautions / Armbands  Precautions Fall risk  Patient armbands applied: Fall risk (Yellow);Patient Identification (White)  Risk for Aspiration  Aspiration Assessment - High Risk None of the above  Aspiration Assessment - At Risk None of the above - No interventions needed  Aspiration Risk Interventions At risk interventions implemented  Seizure Precautions  Seizure Precautions Oxygen set-up at bedside  Adult Fall Risk Assessment  Risk Factor Category (scoring not indicated) High fall risk per protocol (document High  fall risk)  Patient Fall Risk Level High fall risk  Adult Fall Risk Interventions  Required Bundle Interventions *See Row Information* High fall risk - low, moderate, and high requirements implemented  Additional Interventions PT/OT need assessed if change in mobility from baseline;Use of appropriate toileting equipment (bedpan, BSC, etc.)  Screening for Fall Injury Risk (To be completed on HIGH fall risk patients) - Assessing Need for Floor Mats  Risk For Fall Injury- Criteria for Floor Mats Noncompliant with safety precautions  Will Implement Floor Mats Yes  Safe Patient Handling Required Documentation-Repositioning Needs  Assist with movement in bed No  Fragile skin with pressure ulcer No  Unresponsive No  Maxislide indicated Yes  Safe Patient Handling Assessment  Ambulates independently No  Pt pulls to standing position & wt <120kg (265lbs) Yes  Safe Patient Handling Equipment One assist recommended  Safe Patient Handling - Sara/Sabina Lift Assessment  The patient is less than 190kg (420lbs) in weight  Safe Patient Handling - Maximove / Viking Total Lift Assessment  Can tolerate a semi reclined position Yes  Weighs less than 227kg (500lbs) Yes  Safe Patient Handling Equipment Maximove / Viking Total Lift recommended  Safety Interventions  Less Restrictive Interventions Active listening;Bed alarm;Limit setting (adjust lighting in room, reduce environmental noise);Pain/Anxiety management;Provide reassurance;Repositioning  Violence Assessment Tool  History of Violence 0  Confused 0  Irritable 1  Boisterous 1  Verbal Threats 4  Physical  Threats 4  Attacking Objects 0  Agitate/Impulsive 1  Paranoid/Suspicious 1  Intoxication/Withdrawal 0  Socially Inappropriate/Disruptive Behavior 1  Body Language 0  VAT Score 13  VAT Category High Risk  VAT Interventions High Risk (Score > 3) Interventions Implemented  Safe Environment  Patient oriented to unit and equipment Yes     01/27/21 2200  Potential Triggers for Violence  Physical Contributing Factor/s Pain  Psychological Contributing Factor/s Uncertainty;Feeling neglected;Loss of control;Being lectured  Environmental Contributing Factor/s Time of day;Days of the week  Activity Contributing Factor/s Bathing;Medication;Toileting;Changes in routine  Potential De-escalation Techniques Have space and time alone

## 2021-01-27 NOTE — Progress Notes (Signed)
Patient allowed night meds and vitals signs to be taken as appropriate around 11pm on 2/26. He was especially concerned about making sure his BP was down and advised me to check it again in the next hour. Patient explained frustrations with care and paranoia among certain healthcare workers, such as a Comptroller.  He seems to be comfortable when being very involved in coordinating the time his care is done.

## 2021-01-27 NOTE — Progress Notes (Signed)
[facility] PROGRESS NOTE  [name] OTR:[id number] DOB: [birth year] DOA: 12/17/2020 PCP: [name]    01/16/2021   Status: Remains inpatient appropriate because:Ongoing active pain requiring inpatient pain management, Unsafe d/c plan and IV treatments appropriate due to intensity of illness or inability to take PO   Dispo:  The patient is from: homeless              Anticipated d/c is to: [facility] pending availability of 1st floor room              Anticipated d/c date is: > 3 days              Patient currently is not medically stable to d/c.  Barriers to discharge: Patient is homeless and currently has significant gait and ambulation issues secondary to recent bilateral amputation of toes.  Because of his polysubstance abuse he has yet to receive an SNF bed offer. Safety concern over his ongoing severe dysarthria/expressive aphasia and gait disturbance/high risk for falls if discharged too quickly back to the streets.  Concerns re: suspected misuse of Medicaid funds.   Difficult to place patient Yes   Level of care: Med-Surg  Code Status: Full Family Communication: Patient only DVT prophylaxis: Lovenox Vaccination status: Does not appear to be vaccinated against Covid  Foley catheter: No  HPI: 55 year old male with past medical history of coronary artery disease, chronic systolic CHF, CVA presented with chest pain and became unresponsive with left-sided weakness and left gaze deviation.  Was intubated in the ER.  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  Extubated on 1/20 and transferred to [facility] service on 1/21.  Subjective: Difficult to understand Has many concerns today - food and the way trays are delivered - sheets Seems like he knows he can't leave hospital, this is part of frustration? 15+ min conversation - mostly him expressing concern about  hospitalization   Objective: Vitals:   01/27/21 0439 01/27/21 0842  BP: (!) 153/90 137/90  Pulse: 63 66  Resp: 16 18  Temp: 98.2 F (36.8 C)   SpO2: 100% 100%    Intake/Output Summary (Last 24 hours) at 01/27/2021 1716 Last data filed at 01/27/2021 1318 Gross per 24 hour  Intake -  Output 1275 ml  Net -1275 ml   Filed Weights   01/20/21 0500 01/23/21 0314 01/27/21 0500  Weight: 74.1 kg 73.9 kg 76.2 kg    Exam:  General: No acute distress. Lungs: unlabored Neurological: Alert and oriented. Moves all extremities 4.  Dystonia to R arm.  Dysarthria and expressive aphasia. Cranial nerves II through XII grossly intact. Skin: Warm and dry. No rashes or lesions. Extremities: No clubbing or cyanosis. No edema.  First toe amputations bilaterally.    Assessment/Plan: Acute problems: Acute ischemic stroke-right superior and middle cerebellar peduncle -Reviewed CT head and MRI brain and MRA head and neck -EEG shows diffuse encephalopathy -Continue statin; Continue aspirin and Plavix for 3 weeks (LD due 2/11) and then aspirin alone as per neurology's recommendation.   -1/25: Due to persistent right-sided weakness repeated CT head, MRI and EEG with no acute findings. -A1c: 6.4%, LDL: 72, echo shows ejection fraction less than 20% with grade 2 diastolic dysfunction -Neurology signed off -PT/OT recommended SNF but with patient's polysubstance abuse history he has not received any bed offers -PT continues to recommend SNF.  At this juncture best option would be to focus  on more aggressive therapy until patient able to manage independently in long-term hotel room as described above. -Repeat MRI on 2/8 as below-no acute infarct-suspect dysarthria and dystonia secondary to side effects from Lyrica as described below -2/17 reconsulted SLP in regards to cognition and speech with ongoing dysarthria -Given significant communication needs and mobility needs have requested one-to-one safety  sitter to assist with this - will continue.    Bipolar disorder/homelessness  Decision Making Capacity: -Poor compliance.  Homelessness.   -Consulted psychiatry early during the hospitalization-evaluated and cleared him a danger to himself or to others.  Repeat evaluation on 2/14.  Patient had improved insight but lacked capacity to make medical decisions.  He was able to state to the psychiatric provider that he agreed to continue to stay in the hospital "I need to do what is right". - appreciate psychiatry consultation -> they're recommending ethics consult - will discuss on 2/28 (Monday) (likely needs guardian), gabapentin, difficult situation if he tries to leave, limited ability to stop him (see psych note), recommended negotiation and consultation -Continue Depakote -No typical antipsychotics based on history of prolonged QTC and underlying LV dysfunction  Acute dysarthria with dystonia -MRI negative for acute changes -Symptoms resolved after discontinuation of Lyrica -Has mild residual dystonia likely related to recent cerebellar stroke -Typically antipsychotics are used to treat dystonia (ballisma) but unable to use 2/2 prolonged qtc-can use AEDs and pt currently on Keppra and Depakote to treat seizure d/o and Bipolar d/o. -2/23 will increase Depakote to 750 mg daily given increase in mood swings  Bilateral great toe ulcers/osteomyelitis noted in left/status post multiple toe amputations: -S/p bilateral great toe amputation on 2/2 via podiatry -2/4 resumed DVT prophylaxis with Lovenox -Discussed with TOC pharmacist changed OxyContin to long-acting morphine.  Preauthorization forms completed and have been faxed. -Continue Cipro for a total of 2 weeks with last date 2/19 - podiatrist remove staples on 2/18 -2/22 reporting significant burning pain in both feet.  Had significant dystonia related to Lyrica.  Started low-dose Neurontin 100 mg 3 times daily- uptitrate -Continue MS Contin  every 12 hours, OxyIR for breakthrough pain and Robaxin as adjunct of therapy  Chronic combined systolic and diastolic heart failure with ejection fraction of less than 20% and grade 2 diastolic dysfunction: -Likely cocaine induced -Lasix discontinued on 2/10 secondary to suboptimal blood pressure readings  -Euvolemic -Continue low-dose Coreg, on 2/4 given suboptimal blood pressure reading Imdur changed to shorter acting low-dose isosorbide.  Hydralazine was also discontinued.  Continue Entresto; continue Aldactone -Strict I/O's and daily weight - will liberalize diet per his request -Follow-up with cardiology outpatient-appreciate cardiology's recommendation  Suspected Seizures: -EEG from 1/17 w/ diffuse encephalopathy, nonspecific etiology.  EEG from 1/19 showed evidence of epileptogenicity arising from right more than left frontal region. -EEG on 1/26 shows no seizures or epileptiform discharges were seen throughout the recording. -Continue Keppra 500 mg twice daily as per neurology's recommendations  Coronary artery disease: -Cardiology recommended outpatient cath at least 1 month after recovery from stroke. -Continue aspirin and atorvastatin  Physical deconditioning/gait instability -making progress with therapy, continue -We will order rolling walker and wheelchair noting patient is at high risk for falls although he is making progress needing only minimal assist to come to stand with rolling walker and minimal to mod assist x2 to stand step to the right with a rolling walker -Plan is to remain inpatient until gait and ambulation maximized -Safety sitter requested as above  Other problems: Acute metabolic/toxic encephalopathy: -Multifactorial  in the setting of stroke, drug use, respiratory failure and sedation -Resolved -Continue folic acid, thiamine and multivitamin -Seen by psychiatry-appreciate help  Hypertension:  -continue meds - he's been refusing  medications -Monitor blood pressure closely  Acute transaminitis -LFTs as of 2/3 are within normal limits except for low albumin of 3.1 -Elevated LFTs likely related to shock liver phases of admission the mild elevation of total bilirubin to 1.7 therefore not consistent with acute liver failure  Oral pain w/ upper palate edema -Orthopantogram unremarkable -As of 2/10 is resolved  Cocaine use:  Counseled about cessation  Hyponatremia:  Resolved  Bipolar disorder/homelessness: -Poor compliance.  Homelessness.   Consulted psychiatry-evaluated and cleared him a danger to himself or to others. -Continue Depakote  Hyperlipidemia:  Continue statin.  Data Reviewed: Basic Metabolic Panel: No results for input(s): NA, K, CL, CO2, GLUCOSE, BUN, CREATININE, CALCIUM, MG, PHOS in the last 168 hours. Liver Function Tests: No results for input(s): AST, ALT, ALKPHOS, BILITOT, PROT, ALBUMIN in the last 168 hours. No results for input(s): LIPASE, AMYLASE in the last 168 hours. No results for input(s): AMMONIA in the last 168 hours. CBC: No results for input(s): WBC, NEUTROABS, HGB, HCT, MCV, PLT in the last 168 hours. Cardiac Enzymes: No results for input(s): CKTOTAL, CKMB, CKMBINDEX, TROPONINI in the last 168 hours. BNP (last 3 results) No results for input(s): BNP in the last 8760 hours.  ProBNP (last 3 results) No results for input(s): PROBNP in the last 8760 hours.  CBG: No results for input(s): GLUCAP in the last 168 hours.  No results found for this or any previous visit (from the past 240 hour(s)).   Studies: No results found.  Scheduled Meds: . aspirin  81 mg Oral Daily  . atorvastatin  40 mg Oral Daily  . carvedilol  6.25 mg Oral BID WC  . divalproex  750 mg Oral Daily  . docusate sodium  100 mg Oral Daily  . enoxaparin (LOVENOX) injection  40 mg Subcutaneous Q24H  . folic acid  1 mg Oral Daily  . gabapentin  300 mg Oral TID  . isosorbide dinitrate  5 mg Oral TID   . levETIRAcetam  500 mg Oral BID WC  . magnesium oxide  400 mg Oral BID WC  . methocarbamol  500 mg Oral TID WC  . morphine  30 mg Oral Q12H  . multivitamin with minerals  1 tablet Oral Daily  . sacubitril-valsartan  1 tablet Oral BID WC  . silver sulfADIAZINE   Topical BID WC  . spironolactone  12.5 mg Oral Daily  . thiamine  100 mg Oral Daily   Continuous Infusions: . lactated ringers Stopped (01/02/21 1552)    Principal Problem:   Ischemic stroke (HCC) Active Problems:   Basilar artery embolus   Acute on chronic combined systolic and diastolic CHF (congestive heart failure) (HCC)   Respiratory failure (HCC)   Ulcer of great toe, left, with necrosis of bone (HCC)   Ulcer of great toe, right, with necrosis of muscle (HCC)   Acute osteomyelitis of toe, left (HCC)   Osteomyelitis of great toe (HCC)   Shock liver   Acute metabolic encephalopathy   CAD (coronary artery disease)   HTN (hypertension)   Bipolar I disorder (HCC)   Cocaine abuse (HCC)   Homelessness   HLD (hyperlipidemia)   Dystonia   Dysarthria   Pain, dental   Consultants:  Neurology  Cardiology  Podiatry  Palliative  Procedures: Bilateral great toe amputation  Antibiotics: Anti-infectives (From admission, onward)   Start     Dose/Rate Route Frequency Ordered Stop   01/14/21 0000  ciprofloxacin (CIPRO) 500 MG tablet  Status:  Discontinued        500 mg Oral 2 times daily 01/14/21 1223 01/15/21    01/05/21 1145  ciprofloxacin (CIPRO) tablet 500 mg        500 mg Oral 2 times daily 01/05/21 1057 01/19/21 0759   01/02/21 1406  vancomycin (VANCOCIN) powder  Status:  Discontinued          As needed 01/02/21 1406 01/02/21 1428   12/17/20 1450  ceFAZolin (ANCEF) 2-4 GM/100ML-% IVPB       Note to Pharmacy: Cristy Friedlander   : cabinet override      12/17/20 1450 12/17/20 1911       Time spent: 35 minutes    Lacretia Nicks ANP  [facility] 7 am - 330 pm/M-F for direct patient care and  secure chat Please refer to Amion for contact info 41  days

## 2021-01-28 DIAGNOSIS — I639 Cerebral infarction, unspecified: Secondary | ICD-10-CM | POA: Diagnosis not present

## 2021-01-28 MED ORDER — GABAPENTIN 400 MG PO CAPS
400.0000 mg | ORAL_CAPSULE | Freq: Three times a day (TID) | ORAL | Status: DC
  Administered 2021-01-28 – 2021-01-29 (×3): 400 mg via ORAL
  Filled 2021-01-28 (×3): qty 1

## 2021-01-28 MED ORDER — FUROSEMIDE 40 MG PO TABS
40.0000 mg | ORAL_TABLET | Freq: Every day | ORAL | Status: DC
  Administered 2021-01-29 – 2021-02-02 (×4): 40 mg via ORAL
  Filled 2021-01-28 (×6): qty 1

## 2021-01-28 NOTE — Progress Notes (Addendum)
Ethics Consult Note  Initial contact w/ medical team 01/28/21, which was on patient's hospital day 42   Source of Consult: Dr. Lacretia Nicks (hospitlaist), at suggestion of psychiatry consultant  Current attending physician/service: Zigmund Daniel., *  Reason(s) for consult and ethical question(s): 1) Patient lacks capacity for medical decision-making, he is asking to be discharged but is medically unsafe for discharge - ethics of restraining him / forcing him to stay   Information-gathering: Phone discussion with source of consult Chart review 01/28/21    Narrative:  Medical facts: Recent CVA w/ residual neurologic deficits that affect cognition and ambulation/ADL's. He has underlying issues w/ bipolar d/o, personality d/o. Psychiatry has deemed he is no suicidal / does not meet criteria for IVC, have suggested Ethics consult. Attending physician, Dr. Lowell Guitar, has determined that the patient lacks capacity to make medical decisions. Patient is verbally belligerent toward staff and non-adherent to medications, likely there is a component of psychiatric illness affecting this behavior Patient's Personal/Social Facts: patient is homeless, no NOK, patient has not named any family/friends that the medical team can speak to.   Ethical assessments, analyses, justifications (in discussion with another member of Ethics Committee Risa Hanau [name]) 1) Ethical Principles of Autonomy/Beneficence/Non-Maleficence/Justice:   Autonomy (self-determination, vs Paternalism) is reliant on the patent's ability to provide informed consent with regard to accepting/refusing a given treatment/test. If a patient lacks capacity to give informed consent, then that patient cannot assert autonomy.   Attending physician can determine whether a patient has capacity to make medical decisions. Recommend that the attending physicians always keep in mind that this may be considered a "spectrum of capacity" depending on  the patient's cognitive ability to understand a simple versus complex medical situation (patient can refuse vital signs vs can they reasonably refuse necessary surgery or complex medication regimen).   Psychiatry can be helpful in determining capacity if there is a question about whether or to what extent a person's psychiatric conditions affect capacity (particularly if a patient is suicidal / needing involuntary commitment).   Beneficence/Nonmaleficence   Acting in patient's best interests when we are unable to use the patient's best judgment (no advanced directives, no NOK, patient lacks capacity).   To what extend can we "operationalize" our "beneficence" by forcing him to stay if he does not qualify for a locked unit (psychiatry or memory care), and can we keep him indefinitely?  2) Relevant [place] Policy / [place] Statute:   See: [employer] Chapter 90. Medicine and Allied Occupations  90-21.13. Informed consent to health care treatment or procedure. In brief summary, this statute outlines that the following persons, in order indicated, are authorized to consent to medical treatment on behalf of the patient who does not demonstrate capacity to do so: Legally assigned guardian appointed by the court > healthcare agent appointed by legal healthcare power of attorney > healthcare agent appointed by the patient > legal spouse > majority of the patient's reasonably available parents and children who are at least 64 years of age > individual who has an established relationship with the patient, who is acting in good faith on behalf of the patient, and who can reliably convey the patient's wishes > attending physician with confirmation by another physician of the patient's condition and the necessity for treatment (unless in urgent/emergent situation)        Recommendations:  1) Agree with assessment that if this patient lacks capacity to make decision, he needs to have a  court-appointed guardian (this process is underway). In the interim, the attending physician is ethically justified in acting in their best judgment of the patient's best interests, as psychiatry has served as second physician opinion that patient lacks capacity.  2) At this point, the question of discharge/elopement is a bit moot as the patient is not physically able to leave the hospital facility under his own power. Ethics consultants do not believe, however, there is ethical justification for restraints (chemical or physical restraints) if/when he is physically capable of leaving until/unless the patient is a danger to himself/others, and the beneficence/benefits vs the maleficence/harms of such restraints would have to be considered. Could consider locked unit if serious psychiatric issues, concern for severe cognitive impairment. As it stands, this is an unfortunate situation regarding placement of this person, but there is little Ethics can contribute.  3) Keep in mind, capacity is a spectrum / moving target. If the attending physician (possibly in cooperation with psychiatry) determines that this patient can at least understand and appreciate that he will certainly risk becoming sicker and/or dying if he leaves medical care, yet the patient values his independence more than he values his life/health, then there is certainly no ethical justification for restraining this patient. If he is a danger to himself or to others, there certainly is ethical justification for restraining him. This patient appears to be somewhere in between these places.  4) If he can be appropriately medicated, might his capacity improve? Medication adherence seems to be an issue with him. Perhaps this patient might be amenable to suggestion from another perspective (chaplaincy, palliative care)?         Thank you for this consult. Ethics will continue to follow this case.   Dr. Lowell Guitar has my personal cell phone number and  has my permission to share this number at their discretion.  Secure message on Epic is also welcome but may not receive an immediate response.    Sunnie Nielsen, DO, MA [facility] Ethics Committee

## 2021-01-28 NOTE — Progress Notes (Signed)
Physical Therapy Treatment Patient Details Name: [name] MRN: [id number] DOB: 1966-01-09 Today's Date: 01/28/2021    History of Present Illness Homeless 55 yo male found down with c/o CP by bystanders. 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 Extubated 1/20. S/p bil great toe amputations 01/02/21.    PT Comments    Patient progressing well towards PT goals. Continues to wax and wane with regards to behavior. Impulsive at times and easily agitated by certain triggers. Tolerated gait training with Min guard assist and use of RW for support. No LE ataxia noted today. Continues to have poor awareness of safety/deficits. Pt not receptive to cues/instruction throughout session relating to safety. Does not lock w/c brakes prior to transfer putting pt at increased fall risk. Able to propel w/c with Min A as pt veers left and tends to run into wall on left side frequently despite cues. Pt asking to be dropped off at the park today for d/c. Will follow.   Follow Up Recommendations  SNF;Supervision/Assistance - 24 hour     Equipment Recommendations  Wheelchair (measurements PT);Wheelchair cushion (measurements PT);Rolling walker with 5" wheels    Recommendations for Other Services       Precautions / Restrictions Precautions Precautions: Fall;Other (comment) Precaution Comments: pt easily agitated due to psych h/o, is very unsafe; s/p bil great toe amputation 01/02/21 Required Braces or Orthoses: Other Brace Other Brace: Bilateral post-op shoes Restrictions Weight Bearing Restrictions: Yes RLE Weight Bearing: Weight bearing as tolerated LLE Weight Bearing: Weight bearing as tolerated Other Position/Activity Restrictions: Post-op shoes present in room    Mobility  Bed Mobility Overal bed mobility:  Modified Independent Bed Mobility: Supine to Sit;Sit to Supine     Supine to sit: Modified independent (Device/Increase time) Sit to supine: Modified independent (Device/Increase time)   General bed mobility comments: increased time but comes to sit from supine with bed flat.    Transfers Overall transfer level: Needs assistance Equipment used: Rolling walker (2 wheeled);None Transfers: Sit to/from [employer] Sit to Stand: Supervision;Min guard Stand pivot transfers: Min guard       General transfer comment: Min guard- Supervision for safety. Stood from [employer], from toilet x1; SPT w/c to chair without locking brakes on w/c so therapist holding it for safety. No awareness and not receptive to instruction.  Ambulation/Gait Ambulation/Gait assistance: Min guard Gait Distance (Feet): 150 Feet Assistive device: Rolling walker (2 wheeled) Gait Pattern/deviations: Step-through pattern;Narrow base of support;Trunk flexed Gait velocity: reduced   General Gait Details: pt with slowed step-through gait, no significant LE ataxia noted. 2 standing rest breaks. Cues to widen boS with little success.   Stairs             Merchant navy officer mobility: Yes Wheelchair propulsion: Both upper extremities;Both lower extermities Wheelchair parts: Needs assistance Distance: 150' Wheelchair Assistance Details (indicate cue type and reason): veers right while propelling w/c running into wall frequently needing assist to straighten out; does not like help. Attempting to use BLEs only vs UEs only when fatigued. Slipping with post op shoes donned.  Modified Rankin (Stroke Patients Only) Modified Rankin (Stroke Patients Only) Pre-Morbid Rankin Score: No symptoms Modified Rankin: Moderately severe disability     Balance Overall balance assessment: Needs assistance Sitting-balance support: No upper extremity supported;Feet supported Sitting  balance-Leahy Scale: Good  Sitting balance - Comments: Static sitting EOB, supervision for safety.   Standing balance support: During functional activity Standing balance-Leahy Scale: Poor Standing balance comment: Reliant on UE support on RW and minG, ataxia noted in arm and increased trunk sway when 1 UE removed from RW. Uses LUE to donn mask.                            Cognition Arousal/Alertness: Awake/alert Behavior During Therapy: Impulsive;Agitated Overall Cognitive Status: Impaired/Different from baseline Area of Impairment: Memory;Attention;Safety/judgement;Awareness;Problem solving;Following commands                   Current Attention Level: Selective Memory: Decreased recall of precautions;Decreased short-term memory Following Commands: Follows one step commands inconsistently (more so by choice not inability to follow commands) Safety/Judgement: Decreased awareness of safety;Decreased awareness of deficits Awareness: Intellectual Problem Solving: Slow processing;Difficulty sequencing;Decreased initiation;Requires verbal cues;Requires tactile cues General Comments: Pt easily agitated and prefers to do tasks his way, not very receptive of any cues or education from PT. Does not like to be assisted at times. Easily triggered when seeing nurses. Pt with improved safety awareness and reactional strategies though. Asking to be dropped off at the park today, "If i have my w/c, I will be fine."      Exercises      General Comments General comments (skin integrity, edema, etc.): Pt bleeding from incision site on bil feet. When told it needs to bew rapped, pt shaking head no, " no nurses."      Pertinent Vitals/Pain Pain Assessment: Faces Faces Pain Scale: No hurt    Home Living                      Prior Function            PT Goals (current goals can now be found in the care plan section) Acute Rehab PT Goals Patient Stated Goal: "to get  dropped off at the park today" PT Goal Formulation: With patient Time For Goal Achievement: 02/11/21 Potential to Achieve Goals: Fair    Frequency    Min 3X/week      PT Plan Current plan remains appropriate    Co-evaluation              AM-PAC PT "6 Clicks" Mobility   Outcome Measure  Help needed turning from your back to your side while in a flat bed without using bedrails?: None Help needed moving from lying on your back to sitting on the side of a flat bed without using bedrails?: None Help needed moving to and from a bed to a chair (including a wheelchair)?: A Little Help needed standing up from a chair using your arms (e.g., wheelchair or bedside chair)?: A Little Help needed to walk in hospital room?: A Little Help needed climbing 3-5 steps with a railing? : A Lot 6 Click Score: 19    End of Session Equipment Utilized During Treatment: Gait belt Activity Tolerance: Patient tolerated treatment well;Treatment limited secondary to agitation Patient left: in bed;with call bell/phone within reach Nurse Communication: Mobility status;Other (comment) (request to talk wtih RN) PT Visit Diagnosis: Unsteadiness on feet (R26.81);Other symptoms and signs involving the nervous system (R29.898);Other abnormalities of gait and mobility (R26.89);Ataxic gait (R26.0);Difficulty in walking, not elsewhere classified (R26.2)     Time: 4034-7425 PT Time Calculation (min) (ACUTE ONLY): 23 min  Charges:  $Gait Training: 8-22 mins $Therapeutic Exercise: 8-22  mins                     Army Melia, DPT Acute Rehabilitation Services Pager [phone] Office [phone]       Marcy Panning 01/28/2021, 12:47 PM

## 2021-01-28 NOTE — Progress Notes (Signed)
[facility] PROGRESS NOTE  Draycen Weishaupt OIZ:[id number] DOB: [birth year] DOA: 12/17/2020 PCP: [name]    01/16/2021   Status: Remains inpatient appropriate because:Ongoing active pain requiring inpatient pain management, Unsafe d/c plan and IV treatments appropriate due to intensity of illness or inability to take PO   Dispo:  The patient is from: homeless              Anticipated d/c is to: [facility] pending availability of 1st floor room              Anticipated d/c date is: > 3 days              Patient currently is not medically stable to d/c.  Barriers to discharge: Patient is homeless and currently has significant gait and ambulation issues secondary to recent bilateral amputation of toes.  Because of his polysubstance abuse he has yet to receive an SNF bed offer. Safety concern over his ongoing severe dysarthria/expressive aphasia and gait disturbance/high risk for falls if discharged too quickly back to the streets.  Concerns re: suspected misuse of Medicaid funds.   Difficult to place patient Yes   Level of care: Med-Surg  Code Status: Full Family Communication: Patient only DVT prophylaxis: Lovenox Vaccination status: Does not appear to be vaccinated against Covid  Foley catheter: No  HPI: 55 year old male with past medical history of coronary artery disease, chronic systolic CHF, CVA presented with chest pain and became unresponsive with left-sided weakness and left gaze deviation.  Was intubated in the ER.  Underwent CT head that showed subacute to chronic left cerebellar infarct, cerebral angiogram showed Reghan Thul right superior cerebellar artery cutoff.  Extubated on 1/20 and transferred to [facility] service on 1/21.  Subjective: Difficult to understand Long conversation, many concerns and complaints - some we've previously addressed already or that he's confused about [place] having addressed or not - some we offer to address, but he makes it difficult to  address every issue as he frequently asks people to leave room and to be left alone He mentions going to another hospital and [place]  Objective: Vitals:   01/28/21 0813 01/28/21 1741  BP: (!) 184/121 127/88  Pulse: (!) 110 85  Resp:    Temp:    SpO2: 100%     Intake/Output Summary (Last 24 hours) at 01/28/2021 1903 Last data filed at 01/28/2021 1256 Gross per 24 hour  Intake --  Output 700 ml  Net -700 ml   Filed Weights   01/20/21 0500 01/23/21 0314 01/27/21 0500  Weight: 74.1 kg 73.9 kg 76.2 kg    Exam:  General: No acute distress. Lungs: unlabored Neurological: Difficult to understand with dysarthria/expressive aphasia, r arm dystonia. Moves all extremities 4. Cranial nerves II through XII grossly intact. Skin: Warm and dry Extremities: bilateral first toe amputations    Assessment/Plan: Acute problems: Goals of care  Ethics: patient lacks capacity to make medical decisions based on previous evaluations by psychiatry and ongoing evaluations.  He's continued to refuse to cooperate with his care recently while i've taken care of him and he's frequently verbally abusive to staff.  He has difficulty communicating related to his stroke and behavioral issues suspected related to his underlying bipolar/psychiatric illness.  Appreciate psych and ethics recommendations.  Working on guardianship with assistance of social work.  Acute ischemic stroke-right superior and middle cerebellar peduncle -Reviewed CT head and MRI brain and MRA head and neck -EEG shows diffuse encephalopathy -Continue statin;  Continue aspirin and Plavix for 3 weeks (LD due 2/11) and then aspirin alone as per neurology's recommendation.   -1/25: Due to persistent right-sided weakness repeated CT head, MRI and EEG with no acute findings. -A1c: 6.4%, LDL: 72, echo shows ejection fraction less than 20% with grade 2 diastolic dysfunction -Neurology signed off -PT/OT recommended SNF but  with patient's polysubstance abuse history he has not received any bed offers -PT continues to recommend SNF.  At this juncture best option would be to focus on more aggressive therapy until patient able to manage independently in long-term hotel room as described above. -Repeat MRI on 2/8 as below-no acute infarct-suspect dysarthria and dystonia secondary to side effects from Lyrica as described below -2/17 reconsulted SLP in regards to cognition and speech with ongoing dysarthria -Given significant communication needs and mobility needs have requested one-to-one safety sitter to assist with this - will continue.    Bipolar disorder/homelessness  Decision Making Capacity: -Poor compliance.  Homelessness.   -Consulted psychiatry early during the hospitalization-evaluated and cleared him Dioselina Brumbaugh danger to himself or to others.  Repeat evaluation on 2/14.  Patient had improved insight but lacked capacity to make medical decisions.  He was able to state to the psychiatric provider that he agreed to continue to stay in the hospital "I need to do what is right". - appreciate psychiatry consultation -> they're recommending ethics consult - will discuss on 2/28 (Monday) (likely needs guardian), gabapentin, difficult situation if he tries to leave, limited ability to stop him (see psych note), recommended negotiation and consultation -Continue Depakote -No typical antipsychotics based on history of prolonged QTC and underlying LV dysfunction  Acute dysarthria with dystonia -MRI negative for acute changes -Symptoms resolved after discontinuation of Lyrica -Has mild residual dystonia likely related to recent cerebellar stroke -Typically antipsychotics are used to treat dystonia (ballisma) but unable to use 2/2 prolonged qtc-can use AEDs and pt currently on Keppra and Depakote to treat seizure d/o and Bipolar d/o. -2/23 will increase Depakote to 750 mg daily given increase in mood swings  Bilateral great toe  ulcers/osteomyelitis noted in left/status post multiple toe amputations: -S/p bilateral great toe amputation on 2/2 via podiatry -2/4 resumed DVT prophylaxis with Lovenox -Discussed with TOC pharmacist changed OxyContin to long-acting morphine.  Preauthorization forms completed and have been faxed. -Continue Cipro for Arlis Everly total of 2 weeks with last date 2/19 - podiatrist remove staples on 2/18 -2/22 reporting significant burning pain in both feet.  Had significant dystonia related to Lyrica.  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  Hydralazine was also discontinued.  Continue Entresto; continue Aldactone -Strict I/O's and daily weight - will liberalize diet per his request - will restart lasix, try to check BMP -Follow-up with cardiology outpatient-appreciate cardiology's recommendation  Suspected Seizures: -EEG from 1/17 w/ diffuse encephalopathy, nonspecific etiology.  EEG from 1/19 showed evidence of epileptogenicity arising from right more than left frontal region. -EEG on 1/26 shows no seizures or epileptiform discharges were seen throughout the recording. -Continue Keppra 500 mg twice daily as per neurology's recommendations  Coronary artery disease: -Cardiology recommended outpatient cath at least 1 month after recovery from  stroke. -Continue aspirin and atorvastatin  Physical deconditioning/gait instability -making progress with therapy, continue -We will order rolling walker and wheelchair noting patient is at high risk for falls although he is making progress needing only  minimal assist to come to stand with rolling walker and minimal to mod assist x2 to stand step to the right with Ammon Muscatello rolling walker -Plan is to remain inpatient until gait and ambulation maximized -Safety sitter requested as above  Other problems: Acute metabolic/toxic encephalopathy: -Multifactorial in the setting of stroke, drug use, respiratory failure and sedation -Resolved -Continue folic acid, thiamine and multivitamin -Seen by psychiatry-appreciate help  Hypertension:  -continue meds - he's been refusing medications -Monitor blood pressure closely  Acute transaminitis -LFTs as of 2/3 are within normal limits except for low albumin of 3.1 -Elevated LFTs likely related to shock liver phases of admission the mild elevation of total bilirubin to 1.7 therefore not consistent with acute liver failure  Oral pain w/ upper palate edema -Orthopantogram unremarkable -As of 2/10 is resolved  Cocaine use:  Counseled about cessation  Hyponatremia:  Resolved  Bipolar disorder/homelessness: -Poor compliance.  Homelessness.   Consulted psychiatry-evaluated and cleared him Keilyn Haggard danger to himself or to others. -Continue Depakote  Hyperlipidemia:  Continue statin.  Data Reviewed: Basic Metabolic Panel: No results for input(s): NA, K, CL, CO2, GLUCOSE, BUN, CREATININE, CALCIUM, MG, PHOS in the last 168 hours. Liver Function Tests: No results for input(s): AST, ALT, ALKPHOS, BILITOT, PROT, ALBUMIN in the last 168 hours. No results for input(s): LIPASE, AMYLASE in the last 168 hours. No results for input(s): AMMONIA in the last 168 hours. CBC: No results for input(s): WBC, NEUTROABS, HGB, HCT, MCV, PLT in the last 168 hours. Cardiac Enzymes: No results for input(s): CKTOTAL, CKMB, CKMBINDEX, TROPONINI in the last 168 hours. BNP (last 3 results) No results for input(s): BNP in the last 8760 hours.  ProBNP (last 3 results) No results for input(s): PROBNP in the last 8760  hours.  CBG: No results for input(s): GLUCAP in the last 168 hours.  No results found for this or any previous visit (from the past 240 hour(s)).   Studies: No results found.  Scheduled Meds: . aspirin  81 mg Oral Daily  . atorvastatin  40 mg Oral Daily  . carvedilol  6.25 mg Oral BID WC  . divalproex  750 mg Oral Daily  . docusate sodium  100 mg Oral Daily  . enoxaparin (LOVENOX) injection  40 mg Subcutaneous Q24H  . folic acid  1 mg Oral Daily  . gabapentin  400 mg Oral TID  . isosorbide dinitrate  5 mg Oral TID  . levETIRAcetam  500 mg Oral BID WC  . magnesium oxide  400 mg Oral BID WC  . methocarbamol  500 mg Oral TID WC  . morphine  30 mg Oral Q12H  . multivitamin with minerals  1 tablet Oral Daily  . sacubitril-valsartan  1 tablet Oral BID WC  . silver sulfADIAZINE   Topical BID WC  . spironolactone  12.5 mg Oral Daily  . thiamine  100 mg Oral Daily   Continuous Infusions: . lactated ringers Stopped (01/02/21 1552)    Principal Problem:   Ischemic stroke (HCC) Active Problems:   Basilar artery embolus   Acute on chronic combined systolic and diastolic CHF (congestive heart failure) (HCC)   Respiratory failure (HCC)   Ulcer of great toe, left, with necrosis of bone (HCC)   Ulcer of great toe, right, with necrosis  of muscle (HCC)   Acute osteomyelitis of toe, left (HCC)   Osteomyelitis of great toe (HCC)   Shock liver   Acute metabolic encephalopathy   CAD (coronary artery disease)   HTN (hypertension)   Bipolar I disorder (HCC)   Cocaine abuse (HCC)   Homelessness   HLD (hyperlipidemia)   Dystonia   Dysarthria   Pain, dental   Consultants:  Neurology  Cardiology  Podiatry  Palliative  Procedures: Bilateral great toe amputation  Antibiotics: Anti-infectives (From admission, onward)   Start     Dose/Rate Route Frequency Ordered Stop   01/14/21 0000  ciprofloxacin (CIPRO) 500 MG tablet  Status:  Discontinued        500 mg Oral 2 times daily  01/14/21 1223 01/15/21    01/05/21 1145  ciprofloxacin (CIPRO) tablet 500 mg        500 mg Oral 2 times daily 01/05/21 1057 01/19/21 0759   01/02/21 1406  vancomycin (VANCOCIN) powder  Status:  Discontinued          As needed 01/02/21 1406 01/02/21 1428   12/17/20 1450  ceFAZolin (ANCEF) 2-4 GM/100ML-% IVPB       Note to Pharmacy: Cristy Friedlander   : cabinet override      12/17/20 1450 12/17/20 1911       Time spent: 35 minutes    Lacretia Nicks ANP  [facility] 7 am - 330 pm/M-F for direct patient care and secure chat Please refer to Amion for contact info 42  days

## 2021-01-28 NOTE — Progress Notes (Signed)
Patient [name] refused medications, vital signs, and assessments for this evening. Will continue to do patient rounding, but otherwise will let patient rest.

## 2021-01-28 NOTE — Progress Notes (Addendum)
2:45pm: CSW attempted to reach Ms. Sharpe again - no answer.  2pm: CSW spoke with Ashley at [employer] to schedule appointment to obtain clothing for patient - appointment scheduled on 02/06/21 at 2:30pm.  9am: CSW and RN CM spoke with Dr. Powell regarding this patient to discuss psychiatry evaluation by Dr. Clary. Dr. Clary's recommendation was to consult the ethics committee to request their involvement.  CSW and RN CM met with patient at bedside to check in - patient repeatedly stated he wanted to speak with hospital Medical Director about his desires to be discharged. CSW informed patient that he could not discharge as he does not have a place to go - patient stated he wanted to leave anyway. When staff inquired with patient if he is taking his medications, he states "f*ck those medicines."  CSW attempted to reach Ms. Sharpe of [facility] to discuss this patient - no answer, a voicemail was left requesting a return call.   , MSW, [name] Transitions of Care  Clinical Social Worker II [phone]  

## 2021-01-29 MED ORDER — CETAPHIL MOISTURIZING EX LOTN
TOPICAL_LOTION | Freq: Every day | CUTANEOUS | Status: DC
  Filled 2021-01-29: qty 473

## 2021-01-29 MED ORDER — GABAPENTIN 300 MG PO CAPS
600.0000 mg | ORAL_CAPSULE | Freq: Three times a day (TID) | ORAL | Status: DC
  Administered 2021-01-29 – 2021-02-03 (×11): 600 mg via ORAL
  Filled 2021-01-29 (×16): qty 2

## 2021-01-29 NOTE — Progress Notes (Signed)
[facility] PROGRESS NOTE  Braian Tijerina EUM:[id number] DOB: 03-Apr-1966 DOA: 12/17/2020 PCP: [name]    01/16/2021   Status: Remains inpatient appropriate because:Ongoing active pain requiring inpatient pain management, Unsafe d/c plan and IV treatments appropriate due to intensity of illness or inability to take PO   Dispo:  The patient is from: homeless              Anticipated d/c is to: [facility] pending availability of 1st floor room              Anticipated d/c date is: > 3 days              Patient currently is not medically stable to d/c.  Barriers to discharge: Patient is homeless and currently has significant gait and ambulation issues secondary to recent bilateral amputation of toes.  Because of his polysubstance abuse he has yet to receive an SNF bed offer. Safety concern over his ongoing severe dysarthria/expressive aphasia and gait disturbance/high risk for falls if discharged too quickly back to the streets.  Concerns re: suspected misuse of Medicaid funds.  Currently working with social work -> pursuing guardianship/APS.   Difficult to place patient Yes   Level of care: Med-Surg  Code Status: Full Family Communication: Patient only DVT prophylaxis: Lovenox Vaccination status: Does not appear to be vaccinated against Covid  Foley catheter: No  HPI: 55 year old male with past medical history of cocaine abuse, bipolar 1, coronary artery disease, chronic systolic CHF, CVA presented with chest pain and became unresponsive with left-sided weakness and left gaze deviation.  He was intubated in the ED and eventually had MRI that showed showed acute ischemic nonhemorrhagic right superior cerebellar artery territory infarct, chronic L PICA distribution infarct.  He was extubated on 1/20 and transferred to [facility] service on 1/21.  It seems he was initially improving and neurology signed off, but code stroke was called on 1/26 due to AMS.  Repeat MRI showed  acute infarct of R superior cerebellar peduncle and right middle cerebellar peduncle.  Due to suicidal ideation, psych was c/s on 1/27 - at that time, they cleared him from psych standpoint.  Palliative care c/s was recommended as patient had been requesting to leave repeatedly despite ongoing needs for medical interventions and evaluation.  Palliative care saw pt on 1/28 and recommended full code, MOST to fill out prior to d/c (see note).  He's declined any of his children to be informed, but at one time was ok with [place] contacting his friend Alac.  Podiatry was consulted for his bilateral foot wounds and he underwent bilateral fist toe amputations on 2/2 by podiatry.  Psych was reconsulted on 2/14 and noted pt lacked capacity to make medical decisions.  Discharge has been limited by lack of safe discharge plan.  He's frequently refusing meds and refusing to participate with nurses.  Curses frequently at staff.  Psych c/s again on 2/26 and recommended ethics consultation and guardianship.  At this time discharge pending guardianship and safe d/c plan.     Subjective: Difficult to understand Long conversation again - asks me several times, says we just need to drop him off at park, says he'll be able to find his friend and they're going to go to [facility] - can't explain exactly how he's going to do this Discussed we're working on safe d/c plan for him  Objective: Vitals:   01/28/21 2210 01/29/21 1644  BP: 102/74 (!) 153/102  Pulse: 83  Resp:    SpO2:      Intake/Output Summary (Last 24 hours) at 01/29/2021 1954 Last data filed at 01/29/2021 1600 Gross per 24 hour  Intake --  Output 1250 ml  Net -1250 ml   Filed Weights   01/23/21 0314 01/27/21 0500 01/29/21 0434  Weight: 73.9 kg 76.2 kg 77.7 kg    Exam:  General: No acute distress. Lungs: unlabored Abdomen: Soft, nontender, nondistended  Neurological: Alert, difficult to assess orientation as he's talking most of the time - dysarthric, ?  Expressive aphasia. Dystonia to R arm.  Moves all extremities 4. Cranial nerves II through XII grossly intact. Skin: Warm and dry. No rashes or lesions. Extremities: No clubbing or cyanosis. No edema.   Assessment/Plan: Acute problems: Goals of care  Ethics: patient lacks capacity to make medical decisions based on previous evaluations by psychiatry and ongoing evaluations.  He's continued to refuse to cooperate with his care recently while i've taken care of him and he's frequently verbally abusive to staff, cursing.  He has difficulty communicating related to his stroke and behavioral issues suspected related to his underlying bipolar/psychiatric illness.  Appreciate psych and ethics recommendations.  Working on guardianship with assistance of social work. See ethics note from 2/28 and psych note from 2/26 Of note, he currently doesn't meet criteria for IVC - per psychiatry, if he attempted to leave the facility, "at this point you have limited ability to stop him".  Per ethics, no justification for restrains if he is physically capable of leaving unless he's [place] danger to himself or others (see note). Capacity needs to be continually assessed   Acute ischemic stroke-right superior and middle cerebellar peduncle -Reviewed CT head and MRI brain and MRA head and neck -EEG shows diffuse encephalopathy -Continue statin; Continue aspirin and Plavix for 3 weeks (LD due 2/11) and then aspirin alone as per neurology's recommendation.   -1/25: Due to persistent right-sided weakness repeated CT head, MRI and EEG with no acute findings. -A1c: 6.4%, LDL: 72, echo shows ejection fraction less than 20% with grade 2 diastolic dysfunction -Neurology signed off -PT/OT recommended SNF but with patient's polysubstance abuse history he has not received any bed offers -PT continues to recommend SNF.  At this juncture best option would be to focus on more aggressive therapy until patient able to manage independently  in long-term hotel room as described above. -Repeat MRI on 2/8 as below-no acute infarct-suspect dysarthria and dystonia secondary to side effects from Lyrica as described below -2/17 reconsulted SLP in regards to cognition and speech with ongoing dysarthria -Given significant communication needs and mobility needs have requested one-to-one safety sitter to assist with this - will continue.    Bipolar disorder/homelessness  Decision Making Capacity: -Poor compliance.  Homelessness.   -Consulted psychiatry early during the hospitalization-evaluated and cleared him Gustabo Gordillo danger to himself or to others.  Repeat evaluation on 2/14.  Patient had improved insight but lacked capacity to make medical decisions.  He was able to state to the psychiatric provider that he agreed to continue to stay in the hospital "I need to do what is right". - appreciate psychiatry consultation 2/26 -> they're recommending ethics consult (as above) - gabapentin (uptitrate), recommended initiation of SSRI (please follow up with psych in setting of QT prolongation), difficult situation if he tries to leave, limited ability to stop him (see psych note), recommended negotiation and consultation -Continue Depakote -No typical antipsychotics based on history of prolonged QTC and underlying LV dysfunction  Acute dysarthria with dystonia -MRI negative for acute changes -Symptoms resolved after discontinuation of Lyrica -Has mild residual dystonia likely related to recent cerebellar stroke -Typically antipsychotics are used to treat dystonia (ballisma) but unable to use 2/2 prolonged qtc-can use AEDs and pt currently on Keppra and Depakote to treat seizure d/o and Bipolar d/o. -2/23 will increase Depakote to 750 mg daily given increase in mood swings  Bilateral great toe ulcers/osteomyelitis noted in left/status post multiple toe amputations: -S/p bilateral great toe amputation on 2/2 via podiatry -2/4 resumed DVT prophylaxis with  Lovenox -Discussed with TOC pharmacist changed OxyContin to long-acting morphine.  Preauthorization forms completed and have been faxed. -Continue Cipro for Norma Ignasiak total of 2 weeks with last date 2/19 - podiatrist remove staples on 2/18 -2/22 reporting significant burning pain in both feet.  Had significant dystonia related to Lyrica.  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  Hydralazine was also discontinued.  Continue Entresto; continue Aldactone -Strict I/O's and daily weight - will liberalize diet per his request - will restart lasix, try to check BMP -Follow-up with cardiology outpatient-appreciate cardiology's recommendation  Suspected Seizures: -EEG from 1/17 w/ diffuse encephalopathy, nonspecific etiology.  EEG from 1/19 showed evidence of epileptogenicity arising from right more than left frontal region. -EEG on 1/26 shows no seizures or epileptiform discharges were seen throughout the recording. -Continue Keppra 500 mg twice daily as per neurology's recommendations  Coronary artery disease: -Cardiology recommended outpatient cath at least 1 month after recovery from stroke. -Continue aspirin and atorvastatin  Physical deconditioning/gait instability -making progress with therapy, continue -We will order rolling walker and wheelchair noting patient is at high risk for falls although he is making progress needing only minimal assist to come to stand with rolling walker and minimal to mod assist x2 to stand step to the right with Rimsha Trembley rolling walker -Plan is to remain inpatient until gait  and ambulation maximized -Safety sitter requested as above  Other problems: Acute metabolic/toxic encephalopathy: -Multifactorial in the setting of stroke, drug use, respiratory failure and sedation -Resolved -Continue folic acid, thiamine and multivitamin -Seen by psychiatry-appreciate help  Hypertension:  -continue meds - he's been refusing medications -Monitor blood pressure closely  Acute transaminitis -LFTs as of 2/3 are within normal limits except for low albumin of 3.1 -Elevated LFTs likely related to shock liver phases of admission the mild elevation of total bilirubin to 1.7 therefore not consistent with acute liver failure  Oral pain w/ upper palate edema -Orthopantogram unremarkable -As of 2/10 is resolved  Cocaine use:  Counseled about cessation  Hyponatremia:  Resolved  Bipolar disorder/homelessness: -Poor compliance.  Homelessness.   Consulted psychiatry-evaluated and cleared him Uzziel Russey danger to himself or to others. -Continue Depakote  Hyperlipidemia:  Continue statin.  Data Reviewed: Basic Metabolic Panel: No results for input(s): NA, K, CL, CO2, GLUCOSE, BUN, CREATININE, CALCIUM, MG, PHOS in the last 168 hours. Liver Function Tests: No results for input(s): AST, ALT, ALKPHOS, BILITOT, PROT, ALBUMIN in the last 168 hours. No results for input(s): LIPASE, AMYLASE in the last 168 hours. No results for input(s): AMMONIA in the last 168 hours. CBC: No results for input(s):  WBC, NEUTROABS, HGB, HCT, MCV, PLT in the last 168 hours. Cardiac Enzymes: No results for input(s): CKTOTAL, CKMB, CKMBINDEX, TROPONINI in the last 168 hours. BNP (last 3 results) No results for input(s): BNP in the last 8760 hours.  ProBNP (last 3 results) No results for input(s): PROBNP in the last 8760 hours.  CBG: No results for input(s): GLUCAP in the last 168 hours.  No results found for this or any previous visit (from the past 240 hour(s)).   Studies: No results  found.  Scheduled Meds: . aspirin  81 mg Oral Daily  . atorvastatin  40 mg Oral Daily  . carvedilol  6.25 mg Oral BID WC  . cetaphil   Topical Daily  . divalproex  750 mg Oral Daily  . docusate sodium  100 mg Oral Daily  . enoxaparin (LOVENOX) injection  40 mg Subcutaneous Q24H  . folic acid  1 mg Oral Daily  . furosemide  40 mg Oral Daily  . gabapentin  600 mg Oral TID  . isosorbide dinitrate  5 mg Oral TID  . levETIRAcetam  500 mg Oral BID WC  . magnesium oxide  400 mg Oral BID WC  . methocarbamol  500 mg Oral TID WC  . morphine  30 mg Oral Q12H  . multivitamin with minerals  1 tablet Oral Daily  . sacubitril-valsartan  1 tablet Oral BID WC  . silver sulfADIAZINE   Topical BID WC  . spironolactone  12.5 mg Oral Daily  . thiamine  100 mg Oral Daily   Continuous Infusions: . lactated ringers Stopped (01/02/21 1552)    Principal Problem:   Ischemic stroke (HCC) Active Problems:   Basilar artery embolus   Acute on chronic combined systolic and diastolic CHF (congestive heart failure) (HCC)   Respiratory failure (HCC)   Ulcer of great toe, left, with necrosis of bone (HCC)   Ulcer of great toe, right, with necrosis of muscle (HCC)   Acute osteomyelitis of toe, left (HCC)   Osteomyelitis of great toe (HCC)   Shock liver   Acute metabolic encephalopathy   CAD (coronary artery disease)   HTN (hypertension)   Bipolar I disorder (HCC)   Cocaine abuse (HCC)   Homelessness   HLD (hyperlipidemia)   Dystonia   Dysarthria   Pain, dental   Consultants:  Neurology  Cardiology  Podiatry  Palliative  Procedures: Bilateral great toe amputation  Antibiotics: Anti-infectives (From admission, onward)   Start     Dose/Rate Route Frequency Ordered Stop   01/14/21 0000  ciprofloxacin (CIPRO) 500 MG tablet  Status:  Discontinued        500 mg Oral 2 times daily 01/14/21 1223 01/15/21    01/05/21 1145  ciprofloxacin (CIPRO) tablet 500 mg        500 mg Oral 2 times daily  01/05/21 1057 01/19/21 0759   01/02/21 1406  vancomycin (VANCOCIN) powder  Status:  Discontinued          As needed 01/02/21 1406 01/02/21 1428   12/17/20 1450  ceFAZolin (ANCEF) 2-4 GM/100ML-% IVPB       Note to Pharmacy: Cristy Friedlander   : cabinet override      12/17/20 1450 12/17/20 1911       Time spent: 35 minutes    Lacretia Nicks ANP  [facility] 7 am - 330 pm/M-F for direct patient care and secure chat Please refer to Amion for contact info 43  days

## 2021-01-29 NOTE — Progress Notes (Signed)
CSW attempted to reach Ms. Tobin Chad of [facility] to discuss this patient - no answer, a voicemail was left requesting a return call.  CSW and RN CM met with patient at bedside - patient in bed watching tv, did not voice any complaints. Patient reports he received a visit from the hospital Medical Director and that he was now expecting a visit from Dr. Florene Glen.  Madilyn Fireman, MSW, [name] Transitions of Care  Clinical Social Worker II [phone]

## 2021-01-29 NOTE — Progress Notes (Signed)
Occupational Therapy Treatment Patient Details Name: [name] MRN: [id number] DOB: June 27, 1966 Today's Date: 01/29/2021    History of present illness Homeless 55 yo male found down with c/o CP by bystanders. 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 Extubated 1/20. S/p bil great toe amputations 01/02/21.   OT comments  Pt progressing slowly towards established OT goals. Pt donning/doffing post-op shoes with Supervision. Pt requiring Min guard A for functional mobility. Pt becoming agitated at end of session and refusing mobility outside of room due to lack of second person to push a chair behind. Pt stating "I refuse! You tell him (the doctor) that this is my first refuse. I refuse!". Pt also with use of multiple expletives during session. Continue to recommend dc to SNF and will continue to follow acutely as admitted.    Follow Up Recommendations  SNF;Supervision/Assistance - 24 hour    Equipment Recommendations  3 in 1 bedside commode;Wheelchair (measurements OT);Wheelchair cushion (measurements OT)    Recommendations for Other Services      Precautions / Restrictions Precautions Precautions: Fall Precaution Comments: pt easily agitated due to psych h/o, is very unsafe; s/p bil great toe amputation 01/02/21 Required Braces or Orthoses: Other Brace Other Brace: Bilateral post-op shoes Restrictions Weight Bearing Restrictions: Yes RLE Weight Bearing: Weight bearing as tolerated LLE Weight Bearing: Weight bearing as tolerated Other Position/Activity Restrictions: Post-op shoes present in room       Mobility Bed Mobility Overal bed mobility: Modified Independent                  Transfers Overall transfer level: Needs assistance Equipment used: Rolling walker (2 wheeled);None Transfers:  Sit to/from Stand Sit to Stand: Supervision;Min guard         General transfer comment: Min guard A for safety    Balance Overall balance assessment: Independent Sitting-balance support: No upper extremity supported;Feet supported Sitting balance-Leahy Scale: Good     Standing balance support: Bilateral upper extremity supported;During functional activity Standing balance-Leahy Scale: Poor                             ADL either performed or assessed with clinical judgement   ADL Overall ADL's : Needs assistance/impaired                     Lower Body Dressing: Supervision/safety;Sit to/from stand Lower Body Dressing Details (indicate cue type and reason): donning/doffing post op shoes Toilet Transfer: Min guard;Ambulation;RW (simulated in room)           Functional mobility during ADLs: Min guard;Rolling walker General ADL Comments: Pt performing LB dressing and then functional mobility. Not exiting room because pt becoming agitated that there was no chair follow.     Vision       Perception     Praxis      Cognition Arousal/Alertness: Awake/alert Behavior During Therapy: Impulsive;Agitated Overall Cognitive Status: Impaired/Different from baseline Area of Impairment: Memory;Attention;Safety/judgement;Awareness;Problem solving;Following commands                   Current Attention Level: Selective Memory: Decreased recall of precautions;Decreased short-term memory Following Commands: Follows one step commands inconsistently Safety/Judgement: Decreased awareness of safety;Decreased awareness of deficits Awareness: Intellectual Problem Solving: Slow processing;Difficulty sequencing;Decreased initiation;Requires verbal cues;Requires tactile cues  General Comments: Pt easily agitated and prefers to do tasks his way, not very receptive of any cues. Pt with improved safety awareness and reactional strategies though. Refusing further mobility  at end of session due to not having chair follow        Exercises     Shoulder Instructions       General Comments      Pertinent Vitals/ Pain       Pain Assessment: Faces Faces Pain Scale: No hurt Pain Intervention(s): Monitored during session  Home Living                                          Prior Functioning/Environment              Frequency  Min 2X/week        Progress Toward Goals  OT Goals(current goals can now be found in the care plan section)  Progress towards OT goals: Progressing toward goals  Acute Rehab OT Goals Patient Stated Goal: "to get dropped off at the park today" OT Goal Formulation: With patient Time For Goal Achievement: 01/31/21 Potential to Achieve Goals: Good ADL Goals Pt Will Perform Eating: Independently;sitting Pt Will Perform Grooming: with min guard assist;sitting;standing Pt Will Perform Upper Body Bathing: with min guard assist;sitting Pt Will Perform Lower Body Bathing: with min guard assist;sit to/from stand Pt Will Perform Upper Body Dressing: with min guard assist;sitting Pt Will Perform Lower Body Dressing: with min guard assist;sit to/from stand Pt Will Transfer to Toilet: with min guard assist;ambulating;bedside commode Pt Will Perform Toileting - Clothing Manipulation and hygiene: with min guard assist;sit to/from stand;sitting/lateral leans Additional ADL Goal #1: Pt will be S in and OOB for basic ADLs Additional ADL Goal #2: Pt will be able to locate all items he needs for task without cues  Plan Discharge plan remains appropriate;Frequency remains appropriate    Co-evaluation                 AM-PAC OT "6 Clicks" Daily Activity     Outcome Measure   Help from another person eating meals?: A Little Help from another person taking care of personal grooming?: A Lot Help from another person toileting, which includes using toliet, bedpan, or urinal?: A Lot Help from another person  bathing (including washing, rinsing, drying)?: A Lot Help from another person to put on and taking off regular upper body clothing?: A Little Help from another person to put on and taking off regular lower body clothing?: A Little 6 Click Score: 15    End of Session Equipment Utilized During Treatment: Gait belt;Rolling walker  OT Visit Diagnosis: Unsteadiness on feet (R26.81);Other abnormalities of gait and mobility (R26.89);Muscle weakness (generalized) (M62.81);Other symptoms and signs involving cognitive function;Hemiplegia and hemiparesis Hemiplegia - Right/Left: Right Hemiplegia - dominant/non-dominant: Dominant Hemiplegia - caused by: Cerebral infarction   Activity Tolerance Patient tolerated treatment well   Patient Left in bed;with call bell/phone within reach;with bed alarm set   Nurse Communication Mobility status        Time: 3536-1443 OT Time Calculation (min): 9 min  Charges: OT General Charges $OT Visit: 1 Visit OT Treatments $Self Care/Home Management : 8-22 mins  Taimane Stimmel MSOT, OTR/L Acute Rehab Pager: [phone] Office: [phone]   Theodoro Grist Cheryll Keisler 01/29/2021, 4:41 PM

## 2021-01-29 NOTE — TOC Progression Note (Addendum)
Transition of Care [facility]) - Progression Note    Patient Details  Name: [name] MRN: [id number] Date of Birth: 1966/05/06  Transition of Care [facility] CM/SW Contact  Curlene Labrum, RN Phone Number: 01/29/2021, 1:12 PM  Clinical Narrative:    Case management met with [name], CSW at the bedside in regards to transitions of care.  The patient was quiet but answered questions appropriately and did not verbalize complaints today regarding patient care issues nor concerns.  Patient currently lacks capacity to make medical decisions at this time per psychiatry evaluation.  The patient is being followed by [place] for evaluation for possible guardianship for medical decisions.  CM and MSW will continue to follow the patient for safe discharge plan.     Expected Discharge Plan: [place] Barriers to Discharge: Homeless with medical needs,Continued Medical Work up  Expected Discharge Plan and Services Expected Discharge Plan: [place] In-house Referral: Clinical Social Work Discharge Planning Services: CM Consult,Indigent Health Clinic,Medication Assistance Post Acute Care Choice: Durable Medical Equipment Living arrangements for the past 2 months: Homeless,No permanent address                 DME Arranged: Walker rolling,Wheelchair manual DME Agency: [employer]     Representative spoke with at DME Agency: Will contact [employer] to order and deliver when closer to discharge to home.             Social Determinants of Health (SDOH) Interventions    Readmission Risk Interventions Readmission Risk Prevention Plan 01/07/2021  Transportation Screening Complete  PCP or Specialist Appt within 3-5 Days Complete  HRI or [place] Complete  Social Work Consult for [place] Planning/Counseling Complete  Palliative Care Screening Complete  Medication Review Press photographer) Complete

## 2021-01-29 NOTE — Progress Notes (Signed)
Pt excited and dsiplaying a positive attitude about amb in hallway this shift. Pt stated that he wanted to go for another walk this morning.

## 2021-01-29 NOTE — Plan of Care (Signed)

## 2021-01-30 LAB — BASIC METABOLIC PANEL
Anion gap: 11 (ref 5–15)
BUN: 14 mg/dL (ref 6–20)
CO2: 24 mmol/L (ref 22–32)
Calcium: 9 mg/dL (ref 8.9–10.3)
Chloride: 106 mmol/L (ref 98–111)
Creatinine, Ser: 0.98 mg/dL (ref 0.61–1.24)
GFR, Estimated: >60 mL/min (ref >60)
Glucose, Bld: 126 mg/dL — ABNORMAL HIGH (ref 70–99)
Potassium: 3.9 mmol/L (ref 3.5–5.1)
Sodium: 141 mmol/L (ref 135–145)

## 2021-01-30 MED ORDER — ALBUTEROL SULFATE (2.5 MG/3ML) 0.083% IN NEBU
2.5000 mg | INHALATION_SOLUTION | Freq: Four times a day (QID) | RESPIRATORY_TRACT | Status: DC | PRN
  Administered 2021-01-30 – 2021-02-02 (×4): 2.5 mg via RESPIRATORY_TRACT
  Filled 2021-01-30 (×4): qty 3

## 2021-01-30 NOTE — Progress Notes (Signed)
PT Cancellation Note  Patient Details Name: [name] MRN: [id number] DOB: 1966-07-06   Cancelled Treatment:    Reason Eval/Treat Not Completed: Patient declined, no reason specified. PT attempted to see patient however at this time patient is agreeable to ambulate but is refusing to utilize post-op shoes. PT reports the patient is required to wear post-op shoes on both feet per most recent podiatry WB status. Pt reports "I am tired of wearing those shoes. If you want to walk I will walk without the shoes, otherwise get you shit and leave". PT will continue to follow.   Arlyss Gandy 01/30/2021, 12:50 PM

## 2021-01-30 NOTE — Progress Notes (Signed)
CSW met with patient at bedside for conversation - patient's requesting assistance from tech to clean his room. Patient states he wants to leave the hospital, I reminded him that is not a safe discharge plan at this time and he shook his head in agreement.  CSW spoke with Ms. Tobin Chad from [facility] to discuss report - Ms. Tobin Chad to staff case with colleagues at meeting and return call to [place] with updates.  Madilyn Fireman, MSW, [name] Transitions of Care  Clinical Social Worker II [phone]

## 2021-01-30 NOTE — Progress Notes (Signed)
Pt has refused all medications on my shift. Pt refuses vitals and assessments. Pt has been hostile declines care

## 2021-01-30 NOTE — Progress Notes (Signed)
[facility] PROGRESS NOTE  [name] OZY:[id number] DOB: 1966-03-19 DOA: 12/17/2020 PCP: [name]    Status: Remains inpatient appropriate because:Ongoing active pain requiring inpatient pain management, Unsafe d/c plan and IV treatments appropriate due to intensity of illness or inability to take PO   Dispo:  The patient is from: homeless              Anticipated d/c is to: [facility] pending availability of 1st floor room              Anticipated d/c date is: > 3 days              Patient currently is not medically stable to d/c.  Barriers to discharge: Patient is homeless and currently has significant gait and ambulation issues secondary to recent bilateral amputation of toes.  Because of his polysubstance abuse he has yet to receive an SNF bed offer. Safety concern over his ongoing severe dysarthria/expressive aphasia and gait disturbance/high risk for falls if discharged too quickly back to the streets.  Concerns re: suspected misuse of Medicaid funds.  Currently working with social work -> pursuing guardianship/APS.   Difficult to place patient Yes   Level of care: Med-Surg  Code Status: Full Family Communication: Patient only DVT prophylaxis: Lovenox Vaccination status: Does not appear to be vaccinated against Covid  Foley catheter: No  HPI: 55 year old male with past medical history of cocaine abuse, bipolar 1, coronary artery disease, chronic systolic CHF, CVA presented with chest pain and became unresponsive with left-sided weakness and left gaze deviation.  He was intubated in the ED and eventually had MRI that showed showed acute ischemic nonhemorrhagic right superior cerebellar artery territory infarct, chronic L PICA distribution infarct.  He was extubated on 1/20 and transferred to [facility] service on 1/21.  It seems he was initially improving and neurology signed off, but code stroke was called on 1/26 due to AMS.  Repeat MRI showed acute infarct of R  superior cerebellar peduncle and right middle cerebellar peduncle.  Due to suicidal ideation, psych was c/s on 1/27 - at that time, they cleared him from psych standpoint.  Palliative care c/s was recommended as patient had been requesting to leave repeatedly despite ongoing needs for medical interventions and evaluation.  Palliative care saw pt on 1/28 and recommended full code, MOST to fill out prior to d/c (see note).  He's declined any of his children to be informed, but at one time was ok with [place] contacting his friend Alac.  Podiatry was consulted for his bilateral foot wounds and he underwent bilateral fist toe amputations on 2/2 by podiatry.  Psych was reconsulted on 2/14 and noted pt lacked capacity to make medical decisions.  Discharge has been limited by lack of safe discharge plan.  He's frequently refusing meds and refusing to participate with nurses.  Curses frequently at staff.  Psych c/s again on 2/26 and recommended ethics consultation and guardianship.  At this time discharge pending guardianship and safe d/c plan.     Subjective: Tangential speaking, difficult to understand.  Complains about staff in general  Objective: Vitals:   01/28/21 2210 01/29/21 1644  BP: 102/74 (!) 153/102  Pulse: 83   Resp:    SpO2:      Intake/Output Summary (Last 24 hours) at 01/30/2021 0904 Last data filed at 01/29/2021 1600 Gross per 24 hour  Intake --  Output 650 ml  Net -650 ml   Filed Weights   01/23/21 0314  01/27/21 0500 01/29/21 0434  Weight: 73.9 kg 76.2 kg 77.7 kg    Exam: Constitutional: NAD Eyes: lids and conjunctivae normal, no scleral icterus ENMT: mmm Neck: normal, supple Respiratory: clear to auscultation bilaterally, no wheezing, no crackles. Normal respiratory effort.  Cardiovascular: Regular rate and rhythm, no murmurs / rubs / gallops. No LE edema. Abdomen: soft, no distention, no tenderness. Bowel sounds positive.   Assessment/Plan: Acute problems: Goals of care    Ethics: patient lacks capacity to make medical decisions based on previous evaluations by psychiatry and ongoing evaluations.  He's continued to refuse to cooperate with his care recently while i've taken care of him and he's frequently verbally abusive to staff, cursing.  He has difficulty communicating related to his stroke and behavioral issues suspected related to his underlying bipolar/psychiatric illness.  Appreciate psych and ethics recommendations.  Working on guardianship with assistance of social work. See ethics note from 2/28 and psych note from 2/26 Of note, he currently doesn't meet criteria for IVC - per psychiatry, if he attempted to leave the facility, "at this point you have limited ability to stop him".  Per ethics, no justification for restrains if he is physically capable of leaving unless he's a danger to himself or others (see note). Capacity needs to be continually assessed   Acute ischemic stroke-right superior and middle cerebellar peduncle -Reviewed CT head and MRI brain and MRA head and neck -EEG shows diffuse encephalopathy -Continue statin; Continue aspirin and Plavix for 3 weeks (LD due 2/11) and then aspirin alone as per neurology's recommendation.   -1/25: Due to persistent right-sided weakness repeated CT head, MRI and EEG with no acute findings. -A1c: 6.4%, LDL: 72, echo shows ejection fraction less than 20% with grade 2 diastolic dysfunction -Neurology signed off -PT/OT recommended SNF but with patient's polysubstance abuse history he has not received any bed offers -PT continues to recommend SNF.  At this juncture best option would be to focus on more aggressive therapy until patient able to manage independently in long-term hotel room as described above. -Repeat MRI on 2/8 as below-no acute infarct-suspect dysarthria and dystonia secondary to side effects from Lyrica as described below -2/17 reconsulted SLP in regards to cognition and speech with ongoing  dysarthria -Given significant communication needs and mobility needs have requested one-to-one safety sitter to assist with this - will continue.    Bipolar disorder/homelessness   Decision Making Capacity: -Poor compliance.  Homelessness.   -Consulted psychiatry early during the hospitalization-evaluated and cleared him a danger to himself or to others.  Repeat evaluation on 2/14.  Patient had improved insight but lacked capacity to make medical decisions.  He was able to state to the psychiatric provider that he agreed to continue to stay in the hospital "I need to do what is right". - appreciate psychiatry consultation 2/26 -> they're recommending ethics consult (as above) - gabapentin (uptitrate), recommended initiation of SSRI (please follow up with psych in setting of QT prolongation), difficult situation if he tries to leave, limited ability to stop him (see psych note), recommended negotiation and consultation -Continue Depakote -No typical antipsychotics based on history of prolonged QTC and underlying LV dysfunction  Acute dysarthria with dystonia -MRI negative for acute changes -Symptoms resolved after discontinuation of Lyrica -Has mild residual dystonia likely related to recent cerebellar stroke -Typically antipsychotics are used to treat dystonia (ballisma) but unable to use 2/2 prolonged qtc-can use AEDs and pt currently on Keppra and Depakote to treat seizure d/o and Bipolar  d/o. -2/23 will increase Depakote to 750 mg daily given increase in mood swings  Bilateral great toe ulcers/osteomyelitis noted in left/status post multiple toe amputations: -S/p bilateral great toe amputation on 2/2 via podiatry -2/4 resumed DVT prophylaxis with Lovenox -Discussed with TOC pharmacist changed OxyContin to long-acting morphine.  Preauthorization forms completed and have been faxed. -Continue Cipro for a total of 2 weeks with last date 2/19 - podiatrist remove staples on 2/18 -2/22 reporting  significant burning pain in both feet.  Had significant dystonia related to Lyrica.  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  Hydralazine was also discontinued.  Continue Entresto; continue Aldactone -Strict I/O's and daily weight - will liberalize diet per his request - will restart lasix, try to check BMP -Follow-up with cardiology outpatient-appreciate cardiology's recommendation  Suspected Seizures: -EEG from 1/17 w/ diffuse encephalopathy, nonspecific etiology.  EEG from 1/19 showed evidence of epileptogenicity arising from right more than left frontal region. -EEG on 1/26 shows no seizures or epileptiform discharges were seen throughout the recording. -Continue Keppra 500 mg twice daily as per neurology's recommendations  Coronary artery disease: -Cardiology recommended outpatient cath at least 1 month after recovery from stroke. -Continue aspirin and atorvastatin  Physical deconditioning/gait instability -making progress with therapy, continue -We will order rolling walker and wheelchair noting patient is at high risk for falls although he is making progress needing only minimal assist to come to stand with rolling walker and minimal to mod assist x2 to stand step to the right with a rolling walker -Plan is to remain inpatient until gait and ambulation maximized -Safety sitter requested as above  Other problems: Acute metabolic/toxic encephalopathy: -Multifactorial in the setting of stroke, drug use, respiratory failure and sedation -Resolved -Continue folic acid, thiamine and  multivitamin -Seen by psychiatry-appreciate help  Hypertension:  -continue meds - he's been refusing medications -Monitor blood pressure closely  Acute transaminitis -LFTs as of 2/3 are within normal limits except for low albumin of 3.1 -Elevated LFTs likely related to shock liver phases of admission the mild elevation of total bilirubin to 1.7 therefore not consistent with acute liver failure  Oral pain w/ upper palate edema -Orthopantogram unremarkable -As of 2/10 is resolved  Cocaine use:  Counseled about cessation  Hyponatremia:  Resolved  Bipolar disorder/homelessness: -Poor compliance.  Homelessness.   Consulted psychiatry-evaluated and cleared him a danger to himself or to others. -Continue Depakote  Hyperlipidemia:  Continue statin.  Data Reviewed: Basic Metabolic Panel: No results for input(s): NA, K, CL, CO2, GLUCOSE, BUN, CREATININE, CALCIUM, MG, PHOS in the last 168 hours. Liver Function Tests: No results for input(s): AST, ALT, ALKPHOS, BILITOT, PROT, ALBUMIN in the last 168 hours. No results for input(s): LIPASE, AMYLASE in the last 168 hours. No results for input(s): AMMONIA in the last 168 hours. CBC: No results for input(s): WBC, NEUTROABS, HGB, HCT, MCV, PLT in the last 168 hours. Cardiac Enzymes: No results for input(s): CKTOTAL, CKMB, CKMBINDEX, TROPONINI in the last 168 hours. BNP (last 3 results) No results for input(s): BNP in the last 8760 hours.  ProBNP (last 3 results) No results for input(s): PROBNP in the last 8760 hours.  CBG: No results for input(s): GLUCAP in the last 168 hours.  No results found for this or any previous visit (from the past 240 hour(s)).   Studies: No results found.  Scheduled Meds:  aspirin  81 mg Oral Daily   atorvastatin  40 mg Oral Daily   carvedilol  6.25 mg Oral BID WC   cetaphil   Topical Daily   divalproex  750 mg Oral Daily   docusate sodium  100 mg Oral Daily   enoxaparin (LOVENOX)  injection  40 mg Subcutaneous Q24H   folic acid  1 mg Oral Daily   furosemide  40 mg Oral Daily   gabapentin  600 mg Oral TID   isosorbide dinitrate  5 mg Oral TID   levETIRAcetam  500 mg Oral BID WC   magnesium oxide  400 mg Oral BID WC   methocarbamol  500 mg Oral TID WC   morphine  30 mg Oral Q12H   multivitamin with minerals  1 tablet Oral Daily   sacubitril-valsartan  1 tablet Oral BID WC   silver sulfADIAZINE   Topical BID WC   spironolactone  12.5 mg Oral Daily   thiamine  100 mg Oral Daily   Continuous Infusions:  lactated ringers Stopped (01/02/21 1552)    Principal Problem:   Ischemic stroke (HCC) Active Problems:   Basilar artery embolus   Acute on chronic combined systolic and diastolic CHF (congestive heart failure) (HCC)   Respiratory failure (HCC)   Ulcer of great toe, left, with necrosis of bone (HCC)   Ulcer of great toe, right, with necrosis of muscle (HCC)   Acute osteomyelitis of toe, left (HCC)   Osteomyelitis of great toe (HCC)   Shock liver   Acute metabolic encephalopathy   CAD (coronary artery disease)   HTN (hypertension)   Bipolar I disorder (HCC)   Cocaine abuse (HCC)   Homelessness   HLD (hyperlipidemia)   Dystonia   Dysarthria   Pain, dental   Consultants:  Neurology  Cardiology  Podiatry  Palliative  Procedures: Bilateral great toe amputation  Antibiotics: None   Pamella Pert MD [facility] 7 am - 330 pm/M-F for direct patient care and secure chat Please refer to Amion for contact info 44  days

## 2021-01-30 NOTE — Progress Notes (Signed)
Pt. Is refusing all vitals and will not take his medications. He also refused the second assessment. Will continue to encourage assessment.

## 2021-01-30 NOTE — Progress Notes (Signed)
  Speech Language Pathology Treatment: Cognitive-Linquistic  Patient Details Name: [name] MRN: [id number] DOB: January 18, 1966 Today's Date: 01/30/2021 Time: 2831-5176 SLP Time Calculation (min) (ACUTE ONLY): 15 min  Assessment / Plan / Recommendation Clinical Impression  Pt was seen for treatment. He was alert during the session, but expressed his frustrations regarding "the man that wanted me to walk down there" and "that nurse that wants me to take medications". Pt was amenable to participating in treatment stating "I will do speech, I will work with you." However, he quickly returned to voicing his frustrating regarding experiences from earlier today; he was minimally responsive for redirection. He was educated regarding compensatory strategies for speech intelligibility. Clarification was intermittently required due to reduced speech intelligibility and he demonstrated increased vocal intensity and/or overarticulation when clarification was requested. However, pt stated, "I don't care 'bout no strategies!" when asked to recall them. Pt was unable to participate fully in the session today, partly due to his being upset regarding prior events from today. Pt's utterances were fluent, but additional processing time appeared to be needed intermittently. The session was ultimately concluded with the pt stating, "Well just take yo' stuff and go and let me eat in peace." SLP will continue to follow pt. However, rehab potential is questioned if his presentation and active participation today are representative of his norm.    HPI HPI: 55 yr old [place] with chest [place] and became unresponsive with left-sided weakness and left gaze deviation. 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 Intubated 1/17-1/20. Pt  s/p bilateral big toe amputations.      SLP Plan  Continue with current plan of care       Recommendations                   Plan: Continue with current plan of care       Itzabella Sorrels I. Vear Clock, MS, CCC-SLP Acute Rehabilitation Services Office number [phone] Pager [phone]      Scheryl Marten 01/30/2021, 2:52 PM

## 2021-01-31 DIAGNOSIS — Z0279 Encounter for issue of other medical certificate: Secondary | ICD-10-CM

## 2021-01-31 NOTE — Progress Notes (Signed)
Physical Therapy Treatment Patient Details Name: [name] MRN: [id number] DOB: [birth year] Today's Date: 01/31/2021    History of Present Illness Homeless 55 yo male found down with c/o CP by bystanders. 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 Extubated 1/20. S/p bil great toe amputations 01/02/21.    PT Comments    Patient agitated this session upon arrival and states "get another person and I'll walk for you." Ambulated 100' when patient became very agitated at PT assisting with RW and refused to be touched by this PT. Allowed nursing student to assist and PT with close follow throughout. Patient returned to room and stated he couldn't breathe. Checked spO2, reading 98%. Educated patient on deep breathing and pursed lip breathing with poor follow through. Educated patient on his level of endurance and this is a result of deconditioning, patient not receptive. Continue to recommend SNF for ongoing Physical Therapy.       Follow Up Recommendations  SNF;Supervision/Assistance - 24 hour     Equipment Recommendations  Wheelchair (measurements PT);Wheelchair cushion (measurements PT);Rolling [name] with 5" wheels    Recommendations for Other Services       Precautions / Restrictions Precautions Precautions: Fall Precaution Comments: pt easily agitated due to psych h/o, is very unsafe; s/p bil great toe amputation 01/02/21 Required Braces or Orthoses: Other Brace Other Brace: Bilateral post-op shoes Restrictions Weight Bearing Restrictions: Yes RLE Weight Bearing: Weight bearing as tolerated LLE Weight Bearing: Weight bearing as tolerated Other Position/Activity Restrictions: Post-op shoes present in room    Mobility  Bed Mobility Overal bed mobility: Modified Independent                   Transfers Overall transfer level: Needs assistance Equipment used: Rolling [name] (2 wheeled) Transfers: Sit to/from Stand Sit to Stand: Min guard         General transfer comment: Min guard A for safety  Ambulation/Gait Ambulation/Gait assistance: Min guard Gait Distance (Feet): 200 Feet Assistive device: Rolling [name] (2 wheeled) Gait Pattern/deviations: Step-through pattern;Narrow base of support;Trunk flexed Gait velocity: reduced   General Gait Details: Demands chair follow. Not receptive of cues for RW proximity. Attempted to assist by keeping hand on RW. Patient got agitated, so removed hand until out of view of patient. Patient notices hand on RW again. Becomes very agitated and yelling obscenities in hallway. Patient agreeable to allow nursing student assist him   Stairs             Wheelchair Mobility    Modified Rankin (Stroke Patients Only) Modified Rankin (Stroke Patients Only) Pre-Morbid Rankin Score: No symptoms Modified Rankin: Moderately severe disability     Balance Overall balance assessment: Needs assistance Sitting-balance support: No upper extremity supported;Feet supported Sitting balance-Leahy Scale: Good Sitting balance - Comments: Static sitting EOB, supervision for safety.   Standing balance support: Bilateral upper extremity supported;During functional activity Standing balance-Leahy Scale: Poor Standing balance comment: reliant on UE support                            Cognition Arousal/Alertness: Awake/alert Behavior During Therapy: Impulsive;Agitated Overall Cognitive Status: Impaired/Different from baseline Area of Impairment: Memory;Attention;Safety/judgement;Awareness;Problem solving;Following commands                   Current Attention Level:  Selective Memory: Decreased recall of precautions;Decreased short-term memory Following Commands: Follows one step commands inconsistently Safety/Judgement:  Decreased awareness of safety;Decreased awareness of deficits Awareness: Intellectual Problem Solving: Difficulty sequencing;Requires verbal cues;Requires tactile cues General Comments: easily agitated and not receptive of cueing      Exercises      General Comments        Pertinent Vitals/Pain Pain Assessment: Faces Faces Pain Scale: No hurt Pain Intervention(s): Monitored during session    Home Living                      Prior Function            PT Goals (current goals can now be found in the care plan section) Acute Rehab PT Goals Patient Stated Goal: did not state PT Goal Formulation: With patient Time For Goal Achievement: 02/11/21 Potential to Achieve Goals: Fair Progress towards PT goals: Progressing toward goals    Frequency    Min 3X/week      PT Plan Current plan remains appropriate    Co-evaluation              AM-PAC PT "6 Clicks" Mobility   Outcome Measure  Help needed turning from your back to your side while in a flat bed without using bedrails?: None Help needed moving from lying on your back to sitting on the side of a flat bed without using bedrails?: None Help needed moving to and from a bed to a chair (including a wheelchair)?: A Little Help needed standing up from a chair using your arms (e.g., wheelchair or bedside chair)?: A Little Help needed to walk in hospital room?: A Little Help needed climbing 3-5 steps with a railing? : A Lot 6 Click Score: 19    End of Session Equipment Utilized During Treatment: Gait belt;Other (comment) (post op shoes) Activity Tolerance: Patient tolerated treatment well;Treatment limited secondary to agitation Patient left: in bed;with call bell/phone within reach Nurse Communication: Mobility status PT Visit Diagnosis: Unsteadiness on feet (R26.81);Other symptoms and signs involving the nervous system (R29.898);Other abnormalities of gait and mobility (R26.89);Ataxic gait  (R26.0);Difficulty in walking, not elsewhere classified (R26.2)     Time: 0488-8916 PT Time Calculation (min) (ACUTE ONLY): 27 min  Charges:  $Therapeutic Activity: 23-37 mins                     [name] A. Dan Humphreys PT, DPT Acute Rehabilitation Services Pager [phone] Office [phone]    [name] 01/31/2021, 1:51 PM

## 2021-01-31 NOTE — Progress Notes (Signed)
Received a call from [place] that pt called and said he was having difficulty breathing, pt had a few minutes earlier called to speak the Charge Nurse, when I went to see pt, he told me to get out of his room, that he does not want to speak to an African. Pt's RN Lupita Leash) also attempted to see pt but he told her to leave his room and close the door. Security was called who came up to speak with pt as he had called 911 before in the past. [name] was also called to be notified of event. Obasogie-Asidi, Nadja Lina Efe. Obasogie-Asidi, Sevastian Witczak Efe

## 2021-01-31 NOTE — Consult Note (Signed)
[facility] Face-to-Face Psychiatry Consult   Reason for Consult:  Evaluate Capacity for making medical decisions.   Referring Physician:  Pamella Pert, MD  Patient Identification: [name] MRN:  [id number] Principal Diagnosis: Ischemic stroke [facility]) Diagnosis:  Principal Problem:   Ischemic stroke [facility]) Active Problems:   Basilar artery embolus   Acute on chronic combined systolic and diastolic CHF (congestive heart failure) (HCC)   Respiratory failure (HCC)   Ulcer of great toe, left, with necrosis of bone (HCC)   Ulcer of great toe, right, with necrosis of muscle (HCC)   Acute osteomyelitis of toe, left (HCC)   Osteomyelitis of great toe (HCC)   Shock liver   Acute metabolic encephalopathy   CAD (coronary artery disease)   HTN (hypertension)   Bipolar I disorder (HCC)   Cocaine abuse (HCC)   Homelessness   HLD (hyperlipidemia)   Dystonia   Dysarthria   Pain, dental   Total Time spent with patient: 30 minutes  HPI: [name] is a 55 y.o. male patient admitted with PMHx of CHF, diabetes mellitus type 2, unspecified mood disorder presented to [facility] ED via EMS after chest pain complaint followed by unresponsiveness.  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  Psychiatry has been consulted couple of times to evaluate capacity of patient for medical decision making and today also consulted for the same.  Today when patient was approached to evaluate his capacity for making medical decisions, he repeatedly asked to leave the hospital immediately as nobody has any documented to hold him in the hospital.  Various attempts were made to make him understand his recent hospitalization but patient started getting agitated.  He was repetitively focused on getting discharged from hospital and stated would like to go and live in [place].  He states that he understands he has all sorts of medical  problem but that is not going to change his decision about wanting to leave the hospital.  He denies any suicidal or homicidal ideations.  He denies any auditory or visual hallucinations.  Past Psychiatric History: Multiple psychiatric hospitalization, polysubstance use disorder (cocaine, alcohol).  Previously diagnosed with bipolar disorder, unclear.  Risk to Self:  No Risk to Others:  No Prior Inpatient Therapy:  Yes Prior Outpatient Therapy:  No  Family History: History reviewed. No pertinent family history. Family Psychiatric  History: Unknown Social History:  Social History   Substance and Sexual Activity  Alcohol Use Yes     Social History   Substance and Sexual Activity  Drug Use Yes  . Types: "Crack" cocaine    Social History   Socioeconomic History  . Marital status: Single    Spouse name: Not on file  . Number of children: 4  . Years of education: Not on file  . Highest education level: Not on file  Occupational History  . Not on file  Tobacco Use  . Smoking status: Current Every Day Smoker    Types: Cigarettes  . Smokeless tobacco: Never Used  Vaping Use  . Vaping Use: Never used  Substance and Sexual Activity  . Alcohol use: Yes  . Drug use: Yes    Types: "Crack" cocaine  . Sexual activity: Not Currently    Partners: Female  Other Topics Concern  . Not on file  Social History Narrative  . Not on file   Social Determinants of Health   Financial Resource Strain: Not on file  Food Insecurity:  Not on file  Transportation Needs: Not on file  Physical Activity: Not on file  Stress: Not on file  Social Connections: Not on file   Additional Social History: Patient is homeless.    Allergies:   Allergies  Allergen Reactions  . Ibuprofen Anaphylaxis  . Latex Other (See Comments)    Reaction??  Roselee Nova Cr [Pregabalin Er] Other (See Comments)    Dystonia and dysarthria  . Peanut-Containing Drug Products Other (See Comments)    Allergic to peanut  butter (reaction??)  . Tylenol [Acetaminophen] Hives and Nausea And Vomiting    Per pt he can't have it because it will kill him.    Labs:  Results for orders placed or performed during the hospital encounter of 12/17/20 (from the past 48 hour(s))  Basic metabolic panel     Status: Abnormal   Collection Time: 01/30/21 10:04 AM  Result Value Ref Range   Sodium 141 135 - 145 mmol/L   Potassium 3.9 3.5 - 5.1 mmol/L   Chloride 106 98 - 111 mmol/L   CO2 24 22 - 32 mmol/L   Glucose, Bld 126 (H) 70 - 99 mg/dL    Comment: Glucose reference range applies only to samples taken after fasting for at least 8 hours.   BUN 14 6 - 20 mg/dL   Creatinine, Ser 1.82 0.61 - 1.24 mg/dL   Calcium 9.0 8.9 - 99.3 mg/dL   GFR, Estimated >71 >69 mL/min    Comment: (NOTE) Calculated using the CKD-EPI Creatinine Equation (2021)    Anion gap 11 5 - 15    Comment: Performed at [facility], [address] [place], [name] [address]    Current Facility-Administered Medications  Medication Dose Route Frequency Provider Last Rate Last Admin  . acetaminophen (TYLENOL) tablet 650 mg  650 mg Oral Q4H PRN Park Liter, DPM   650 mg at 01/07/21 0426   Or  . acetaminophen (TYLENOL) 160 MG/5ML solution 650 mg  650 mg Per Tube Q4H PRN Park Liter, DPM       Or  . acetaminophen (TYLENOL) suppository 650 mg  650 mg Rectal Q4H PRN Park Liter, DPM      . albuterol (PROVENTIL) (2.5 MG/3ML) 0.083% nebulizer solution 2.5 mg  2.5 mg Nebulization Q6H PRN Leatha Gilding, MD   2.5 mg at 01/31/21 1116  . aspirin chewable tablet 81 mg  81 mg Oral Daily Park Liter, DPM   81 mg at 01/31/21 1024  . atorvastatin (LIPITOR) tablet 40 mg  40 mg Oral Daily Park Liter, DPM   40 mg at 01/31/21 1026  . carvedilol (COREG) tablet 6.25 mg  6.25 mg Oral BID WC Park Liter, DPM   6.25 mg at 01/30/21 2016  . cetaphil lotion   Topical Daily Zigmund Daniel., MD   Given at 01/31/21 1030  . divalproex  (DEPAKOTE ER) 24 hr tablet 750 mg  750 mg Oral Daily Junious Silk L, NP   750 mg at 01/31/21 1019  . docusate (COLACE) 50 MG/5ML liquid 100 mg  100 mg Oral Daily PRN Park Liter, DPM   100 mg at 01/06/21 2252  . docusate sodium (COLACE) capsule 100 mg  100 mg Oral Daily Zigmund Daniel., MD   100 mg at 01/31/21 1026  . enoxaparin (LOVENOX) injection 40 mg  40 mg Subcutaneous Q24H Russella Dar, NP   40 mg at 01/17/21 1325  . folic  acid (FOLVITE) tablet 1 mg  1 mg Oral Daily Park Liter, DPM   1 mg at 01/31/21 1023  . furosemide (LASIX) tablet 40 mg  40 mg Oral Daily Zigmund Daniel., MD   40 mg at 01/31/21 1022  . gabapentin (NEURONTIN) capsule 600 mg  600 mg Oral TID Zigmund Daniel., MD   600 mg at 01/31/21 1026  . isosorbide dinitrate (ISORDIL) tablet 5 mg  5 mg Oral TID Russella Dar, NP   5 mg at 01/31/21 1018  . lactated ringers infusion   Intravenous Continuous Park Liter, DPM   Stopped at 01/02/21 1552  . levETIRAcetam (KEPPRA) tablet 500 mg  500 mg Oral BID WC Zigmund Daniel., MD   500 mg at 01/31/21 1028  . LORazepam (ATIVAN) tablet 1 mg  1 mg Oral Q4H PRN Park Liter, DPM   1 mg at 01/31/21 0300  . magnesium oxide (MAG-OX) tablet 400 mg  400 mg Oral BID WC Zigmund Daniel., MD   400 mg at 01/31/21 1022  . methocarbamol (ROBAXIN) tablet 500 mg  500 mg Oral TID WC Zigmund Daniel., MD   500 mg at 01/31/21 1023  . morphine (MS CONTIN) 12 hr tablet 30 mg  30 mg Oral Q12H Zigmund Daniel., MD   30 mg at 01/31/21 1015  . multivitamin with minerals tablet 1 tablet  1 tablet Oral Daily Park Liter, DPM   1 tablet at 01/31/21 1024  . ondansetron (ZOFRAN) injection 4 mg  4 mg Intravenous Q6H PRN Park Liter, DPM      . oxyCODONE (Oxy IR/ROXICODONE) immediate release tablet 5 mg  5 mg Oral Q4H PRN Russella Dar, NP   5 mg at 01/31/21 1038  . phenol (CHLORASEPTIC) mouth spray 1 spray  1 spray Mouth/Throat PRN Park Liter, DPM   1 spray at 01/08/21 2103  . sacubitril-valsartan (ENTRESTO) 24-26 mg per tablet  1 tablet Oral BID WC Zigmund Daniel., MD   1 tablet at 01/31/21 1025  . silver sulfADIAZINE (SILVADENE) 1 % cream   Topical BID WC Zigmund Daniel., MD   Given at 01/28/21 1758  . spironolactone (ALDACTONE) tablet 12.5 mg  12.5 mg Oral Daily Park Liter, DPM   12.5 mg at 01/31/21 1024  . thiamine tablet 100 mg  100 mg Oral Daily Park Liter, DPM   100 mg at 01/31/21 1026    Musculoskeletal: Strength & Muscle Tone: decreased Gait & Station: unable to stand Patient leans: N/A  Psychiatric Specialty Exam: Physical Exam Vitals and nursing note reviewed.  Constitutional:      Appearance: Normal appearance. He is obese.  HENT:     Head: Normocephalic and atraumatic.  Cardiovascular:     Rate and Rhythm: Normal rate and regular rhythm.  Pulmonary:     Effort: Pulmonary effort is normal.  Musculoskeletal:     Cervical back: Normal range of motion.  Neurological:     Mental Status: He is alert. Mental status is at baseline.     Review of Systems  Blood pressure (!) 141/91, pulse 87, temperature 98 F (36.7 C), temperature source Oral, resp. rate 17, height 5\' 9"  (1.753 m), weight 77.7 kg, SpO2 100 %.Body mass index is 25.3 kg/m.  General Appearance: Casual  Eye Contact:  Good  Speech:  Pressured  Volume:  Increased  Mood:  Irritable  Affect:  Congruent  Thought Process:  Goal Directed and Descriptions of Associations: Circumstantial  Orientation:  Full (Time, Place, and Person)  Thought Content:  Illogical and Rumination  Suicidal Thoughts:  No  Homicidal Thoughts:  No  Memory:  Immediate;   Poor Recent;   Poor Remote;   Poor  Judgement:  Poor  Insight:  Lacking  Psychomotor Activity:  Increased  Concentration:  Concentration: Fair and Attention Span: Fair  Recall:  Poor  Fund of Knowledge:  Fair  Language:  Fair  Akathisia:  No  Handed:  Right  AIMS  (if indicated):     Assets:  Resilience  ADL's:  Impaired  Cognition:  Impaired,  Mild  Sleep:      Assessment: 55 year old male admitted after nonhemorrhagic stroke.  His current presentation is likely due to unspecified mood disorder. -On evaluation today irritable and uncooperative.  Multiple requests were made to let us try to explain him his necessity for hospitalization but he is repetitively refused to hear anyone.  He was unable to understand the concept of his inability to take care of himself on his own, pay for himself, transportation etc. but, he understands that his physical health has potential for poor outcomes. -As mentioned in earlier consult from psychiatry, patient does not meet criteria for involuntary commitment as he denies any suicidal or homicidal ideations.  He denies any auditory or visual hallucinations and does not seem like responding to any internal stimuli.  His inability to care for himself or poor decision-making is not enough to involuntarily commit him.  From psychiatric point of view patient does not need any psych inpatient hospitalization and cannot be IVC'd.    Recommendations: -Appreciate social worker ongoing assistance with guardianship. -Patient does not meet criteria for psych inpatient hospitalization or involuntary commitment. -No further recommendation at this point, psychiatry will sign off but will be available for any further questions.   Disposition: No evidence of imminent risk to self or others at present.   Patient does not meet criteria for psychiatric inpatient admission.  Arnoldo Lenis, MD 01/31/2021 1:02 PM

## 2021-01-31 NOTE — TOC Progression Note (Signed)
Transition of Care [facility]) - Progression Note    Patient Details  Name: [name] MRN: [id number] Date of Birth: 10/03/1966  Transition of Care [facility]) CM/SW [place], RN Phone Number: 01/31/2021, 1:46 PM  Clinical Narrative:    Case management and MSW met with the patient at the bedside regarding transitions of care.  The patient verbalized that he would be interested in transferring to [facility] [employer] - residential treatment center for 90 days.  Please see note from Madilyn Fireman, MSW regarding this matter.  The patient states that he has stayed at this facility in the past after his diagnosis with CHF.    The patient was cooperative and answered questions appropriately during conversation.  The patient states that his surgical site bleeds slightly after bearing weight with ambulation.  The patient verbalizes understanding that he needs to refrain from drug use so as patient states "to keep from having another heart attack".   Patient has available athletic shoes, post op shoes, wheelchair and RW present in the hospital room at this time, whenever he has a safe discharge plan in place.  CM and MSW with [facility] will continue to follow the patient for transitions of care needs.  Expected Discharge Plan: [place] Barriers to Discharge: Homeless with medical needs,Continued Medical Work up  Expected Discharge Plan and Services Expected Discharge Plan: [place] In-house Referral: Clinical Social Work Discharge Planning Services: CM Consult,Indigent Health Clinic,Medication Assistance Post Acute Care Choice: Durable Medical Equipment Living arrangements for the past 2 months: Homeless,No permanent address                 DME Arranged: Walker rolling,Wheelchair manual DME Agency: [employer]     Representative spoke with at DME Agency: Will contact [employer] to order and deliver when closer to discharge to home.              Social Determinants of Health (SDOH) Interventions    Readmission Risk Interventions Readmission Risk Prevention Plan 01/07/2021  Transportation Screening Complete  PCP or Specialist Appt within 3-5 Days Complete  HRI or [place] Complete  Social Work Consult for [place] Planning/Counseling Complete  Palliative Care Screening Complete  Medication Review [employer] photographer) Complete

## 2021-01-31 NOTE — Progress Notes (Signed)
RT was called by RN saying that the patient requested a breathing treatment. I knocked before entering the room. He asked who I was and I identified myself. He proceeded to tell me that I better knock before I enter his room (even though I did), or that he would cuss me out and that he had already cussed everyone else out. I asked if he was having breathing issues and he stated "that is not your problem, I am here for a stroke". I explained that I was here to help with his breathing. I gave him his breathing treatment and left the room. RN is aware.

## 2021-01-31 NOTE — Progress Notes (Signed)
Pt refused care the whole night. No vitals were taken. I was told to get out of his room because we had chocolate milk and not whole milk.

## 2021-01-31 NOTE — Progress Notes (Addendum)
CSW and RN CM spoke with patient at bedside - patient expressed interest in going to [facility] in [place]. Patient reports he was there previously for 90 days. Patient verbalized his understanding of the affects his substance abuse has had on his health and wants to seek treatment.  CSW spoke with Jaynie Collins at [facility] to make a referral - referral accepted. Agency to return call to CSW once a bed becomes available.  Edwin Dada, MSW, [name] Transitions of Care  Clinical Social Worker II [phone]

## 2021-01-31 NOTE — Progress Notes (Signed)
[facility] PROGRESS NOTE  [name] WEX:[id number] DOB: 1966-03-28 DOA: 12/17/2020 PCP: [name]    Status: Remains inpatient appropriate because:Ongoing active pain requiring inpatient pain management, Unsafe d/c plan and IV treatments appropriate due to intensity of illness or inability to take PO   Dispo:  The patient is from: homeless              Anticipated d/c is to: [facility] pending availability of 1st floor room              Anticipated d/c date is: > 3 days              Patient currently is not medically stable to d/c.  Barriers to discharge: Patient is homeless and currently has significant gait and ambulation issues secondary to recent bilateral amputation of toes.  Because of his polysubstance abuse he has yet to receive an SNF bed offer. Safety concern over his ongoing severe dysarthria/expressive aphasia and gait disturbance/high risk for falls if discharged too quickly back to the streets.  Concerns re: suspected misuse of Medicaid funds.  Currently working with social work -> pursuing guardianship/APS.   Difficult to place patient Yes   Level of care: Med-Surg  Code Status: Full Family Communication: Patient only DVT prophylaxis: Lovenox Vaccination status: Does not appear to be vaccinated against Covid  Foley catheter: No  HPI: 55 year old male with past medical history of cocaine abuse, bipolar 1, coronary artery disease, chronic systolic CHF, CVA presented with chest pain and became unresponsive with left-sided weakness and left gaze deviation.  He was intubated in the ED and eventually had MRI that showed showed acute ischemic nonhemorrhagic right superior cerebellar artery territory infarct, chronic L PICA distribution infarct.  He was extubated on 1/20 and transferred to [facility] service on 1/21.  It seems he was initially improving and neurology signed off, but code stroke was called on 1/26 due to AMS.  Repeat MRI showed acute infarct of R  superior cerebellar peduncle and right middle cerebellar peduncle.  Due to suicidal ideation, psych was c/s on 1/27 - at that time, they cleared him from psych standpoint.  Palliative care c/s was recommended as patient had been requesting to leave repeatedly despite ongoing needs for medical interventions and evaluation.  Palliative care saw pt on 1/28 and recommended full code, MOST to fill out prior to d/c (see note).  He's declined any of his children to be informed, but at one time was ok with [place] contacting his friend Alac.  Podiatry was consulted for his bilateral foot wounds and he underwent bilateral fist toe amputations on 2/2 by podiatry.  Psych was reconsulted on 2/14 and noted pt lacked capacity to make medical decisions.  Discharge has been limited by lack of safe discharge plan.  He's frequently refusing meds and refusing to participate with nurses.  Curses frequently at staff.  Psych c/s again on 2/26 and recommended ethics consultation and guardianship.  At this time discharge pending guardianship and safe d/c plan.     Subjective: Tells me to leave the room  Objective: Vitals:   01/30/21 2044 01/31/21 0917  BP:  (!) 141/91  Pulse:  87  Resp:  17  SpO2: 99% 100%    Intake/Output Summary (Last 24 hours) at 01/31/2021 0923 Last data filed at 01/31/2021 9381 Gross per 24 hour  Intake 480 ml  Output 925 ml  Net -445 ml   Filed Weights   01/23/21 0314 01/27/21 0500 01/29/21 0434  Weight: 73.9 kg 76.2 kg 77.7 kg    Exam: Constitutional: In bed, belligerent  Assessment/Plan: Acute problems: Goals of care  Ethics: patient lacks capacity to make medical decisions based on previous evaluations by psychiatry and ongoing evaluations.  He's continued to refuse to cooperate with his care recently while i've taken care of him and he's frequently verbally abusive to staff, cursing.  He has difficulty communicating related to his stroke and behavioral issues suspected related to his  underlying bipolar/psychiatric illness.  Appreciate psych and ethics recommendations.  Working on guardianship with assistance of social work. See ethics note from 2/28 and psych note from 2/26 Of note, he currently doesn't meet criteria for IVC - per psychiatry, if he attempted to leave the facility, "at this point you have limited ability to stop him".  Per ethics, no justification for restrains if he is physically capable of leaving unless he's a danger to himself or others (see note). Capacity needs to be continually assessed   Acute ischemic stroke-right superior and middle cerebellar peduncle -Reviewed CT head and MRI brain and MRA head and neck -EEG shows diffuse encephalopathy -Continue statin; Continue aspirin and Plavix for 3 weeks (LD due 2/11) and then aspirin alone as per neurology's recommendation.   -1/25: Due to persistent right-sided weakness repeated CT head, MRI and EEG with no acute findings. -A1c: 6.4%, LDL: 72, echo shows ejection fraction less than 20% with grade 2 diastolic dysfunction -Neurology signed off -PT/OT recommended SNF but with patient's polysubstance abuse history he has not received any bed offers -PT continues to recommend SNF.  At this juncture best option would be to focus on more aggressive therapy until patient able to manage independently in long-term hotel room as described above. -Repeat MRI on 2/8 as below-no acute infarct-suspect dysarthria and dystonia secondary to side effects from Lyrica as described below -2/17 reconsulted SLP in regards to cognition and speech with ongoing dysarthria -Given significant communication needs and mobility needs have requested one-to-one safety sitter to assist with this - will continue.    Bipolar disorder/homelessness  Decision Making Capacity: -Poor compliance.  Homelessness.   -Consulted psychiatry early during the hospitalization-evaluated and cleared him a danger to himself or to others.  Repeat evaluation on  2/14.  Patient had improved insight but lacked capacity to make medical decisions.  He was able to state to the psychiatric provider that he agreed to continue to stay in the hospital "I need to do what is right". - appreciate psychiatry consultation 2/26 -> they're recommending ethics consult (as above) - gabapentin (uptitrate), recommended initiation of SSRI (please follow up with psych in setting of QT prolongation), difficult situation if he tries to leave, limited ability to stop him (see psych note), recommended negotiation and consultation -Continue Depakote -No typical antipsychotics based on history of prolonged QTC and underlying LV dysfunction  Acute dysarthria with dystonia -MRI negative for acute changes -Symptoms resolved after discontinuation of Lyrica -Has mild residual dystonia likely related to recent cerebellar stroke -Typically antipsychotics are used to treat dystonia (ballisma) but unable to use 2/2 prolonged qtc-can use AEDs and pt currently on Keppra and Depakote to treat seizure d/o and Bipolar d/o. -2/23 will increase Depakote to 750 mg daily given increase in mood swings  Bilateral great toe ulcers/osteomyelitis noted in left/status post multiple toe amputations: -S/p bilateral great toe amputation on 2/2 via podiatry -2/4 resumed DVT prophylaxis with Lovenox -Discussed with TOC pharmacist changed OxyContin to long-acting morphine.  Preauthorization forms completed and  have been faxed. -Continue Cipro for a total of 2 weeks with last date 2/19 - podiatrist remove staples on 2/18 -2/22 reporting significant burning pain in both feet.  Had significant dystonia related to Lyrica.  Started low-dose Neurontin 100 mg 3 times daily- uptitrate -Continue MS Contin every 12 hours, OxyIR for breakthrough pain and Robaxin as adjunct of therapy  Chronic combined systolic and diastolic heart failure with ejection fraction of less than 20% and grade 2 diastolic dysfunction: -Likely  cocaine induced -Euvolemic -Continue low-dose Coreg, on 2/4 given suboptimal blood pressure reading Imdur changed to shorter acting low-dose isosorbide.  Hydralazine was also discontinued.  Continue Entresto; continue Aldactone -Strict I/O's and daily weight - will liberalize diet per his request - will restart lasix, try to check BMP -Follow-up with cardiology outpatient-appreciate cardiology's recommendation  Suspected Seizures: -EEG from 1/17 w/ diffuse encephalopathy, nonspecific etiology.  EEG from 1/19 showed evidence of epileptogenicity arising from right more than left frontal region. -EEG on 1/26 shows no seizures or epileptiform discharges were seen throughout the recording. -Continue Keppra 500 mg twice daily as per neurology's recommendations  Coronary artery disease: -Cardiology recommended outpatient cath at least 1 month after recovery from stroke. -Continue aspirin and atorvastatin  Physical deconditioning/gait instability -making progress with therapy, continue -We will order rolling walker and wheelchair noting patient is at high risk for falls although he is making progress needing only minimal assist to come to stand with rolling walker and minimal to mod assist x2 to stand step to the right with a rolling walker -Plan is to remain inpatient until gait and ambulation maximized -Safety sitter requested as above  Other problems: Acute metabolic/toxic encephalopathy: -Multifactorial in the setting of stroke, drug use, respiratory failure and sedation -Resolved -Continue folic acid, thiamine and multivitamin -Seen by psychiatry-appreciate help  Hypertension:  -continue meds - he's been refusing medications -Monitor blood pressure closely  Acute transaminitis -LFTs as of 2/3 are within normal limits except for low albumin of 3.1 -Elevated LFTs likely related to shock liver phases of admission the mild elevation of total bilirubin to 1.7 therefore not consistent  with acute liver failure  Oral pain w/ upper palate edema -Orthopantogram unremarkable -As of 2/10 is resolved  Cocaine use:  Counseled about cessation  Hyponatremia:  Resolved  Bipolar disorder/homelessness: -Poor compliance.  Homelessness.   Consulted psychiatry-evaluated and cleared him a danger to himself or to others. -Continue Depakote  Hyperlipidemia:  Continue statin.  Data Reviewed: Basic Metabolic Panel: Recent Labs  Lab 01/30/21 1004  NA 141  K 3.9  CL 106  CO2 24  GLUCOSE 126*  BUN 14  CREATININE 0.98  CALCIUM 9.0   Liver Function Tests: No results for input(s): AST, ALT, ALKPHOS, BILITOT, PROT, ALBUMIN in the last 168 hours. No results for input(s): LIPASE, AMYLASE in the last 168 hours. No results for input(s): AMMONIA in the last 168 hours. CBC: No results for input(s): WBC, NEUTROABS, HGB, HCT, MCV, PLT in the last 168 hours. Cardiac Enzymes: No results for input(s): CKTOTAL, CKMB, CKMBINDEX, TROPONINI in the last 168 hours. BNP (last 3 results) No results for input(s): BNP in the last 8760 hours.  ProBNP (last 3 results) No results for input(s): PROBNP in the last 8760 hours.  CBG: No results for input(s): GLUCAP in the last 168 hours.  No results found for this or any previous visit (from the past 240 hour(s)).   Studies: No results found.  Scheduled Meds: . aspirin  81 mg Oral  Daily  . atorvastatin  40 mg Oral Daily  . carvedilol  6.25 mg Oral BID WC  . cetaphil   Topical Daily  . divalproex  750 mg Oral Daily  . docusate sodium  100 mg Oral Daily  . enoxaparin (LOVENOX) injection  40 mg Subcutaneous Q24H  . folic acid  1 mg Oral Daily  . furosemide  40 mg Oral Daily  . gabapentin  600 mg Oral TID  . isosorbide dinitrate  5 mg Oral TID  . levETIRAcetam  500 mg Oral BID WC  . magnesium oxide  400 mg Oral BID WC  . methocarbamol  500 mg Oral TID WC  . morphine  30 mg Oral Q12H  . multivitamin with minerals  1 tablet Oral  Daily  . sacubitril-valsartan  1 tablet Oral BID WC  . silver sulfADIAZINE   Topical BID WC  . spironolactone  12.5 mg Oral Daily  . thiamine  100 mg Oral Daily   Continuous Infusions: . lactated ringers Stopped (01/02/21 1552)    Principal Problem:   Ischemic stroke (HCC) Active Problems:   Basilar artery embolus   Acute on chronic combined systolic and diastolic CHF (congestive heart failure) (HCC)   Respiratory failure (HCC)   Ulcer of great toe, left, with necrosis of bone (HCC)   Ulcer of great toe, right, with necrosis of muscle (HCC)   Acute osteomyelitis of toe, left (HCC)   Osteomyelitis of great toe (HCC)   Shock liver   Acute metabolic encephalopathy   CAD (coronary artery disease)   HTN (hypertension)   Bipolar I disorder (HCC)   Cocaine abuse (HCC)   Homelessness   HLD (hyperlipidemia)   Dystonia   Dysarthria   Pain, dental   Consultants:  Neurology  Cardiology  Podiatry  Palliative  Procedures: Bilateral great toe amputation  Antibiotics: None   Pamella Pert MD [facility] 7 am - 330 pm/M-F for direct patient care and secure chat Please refer to Amion for contact info 45  days

## 2021-02-01 NOTE — Progress Notes (Addendum)
CSW spoke with Neill Loft of [place] [facility] - there are active APS discharge barriers. C[place]ayshia states a medical record request has been submitted and she will need to complete a Mini Mental Status Exam (MMSE) prior to proceeding with investigation. At this time, APS is not seeking guardianship, however that may change after MMSE is completed.   CSW met with patient at bedside briefly to inform him that the referral was made per his request to [facility] for [facility] - he stated understanding. Patient stated [place].  Madilyn Fireman, MSW, [name] Transitions of Care  Clinical Social Worker II [phone]

## 2021-02-01 NOTE — Progress Notes (Addendum)
OT Cancellation Note  Patient Details Name: [name] MRN: [id number] DOB: [birth year]   Cancelled Treatment:    Reason Eval/Treat Not Completed: Patient declined, no reason specified. Patient initially in agreement with OT/PT co-treatment session. Patient received call on hospital phone and immediately began shouting obscenities at this therapist to leave so he could take the phone call. OT to check back as time allows.   Addendum 1435: Patient noted to be standing at nurses station barefoot and with RW shouting obscenities at all staff members. Staff attempting to get patient back to his room but patient is very belligerent and refusing all assistance. Patient threatening staff including this therapist. Patient observed ambulating without assistance and presumably ambulated from bed to RW by door of hospital room without assistance.   Patient has all necessary DME including wheelchair, RW, and BSC. Patient is aware of balance deficits and need for RW with functional mobility. Patient with continue to require supervision A grossly for safety with self-care tasks and is not safe to be independent given cognitive deficits. OT to sign off at this time.   Kallie Edward OTR/L Supplemental OT, Department of rehab services [phone]  Destanae R H. 02/01/2021, 2:12 PM

## 2021-02-01 NOTE — Progress Notes (Signed)
Refused Vitals 

## 2021-02-01 NOTE — Progress Notes (Signed)
[facility] PROGRESS NOTE  [name] YQM:[id number] DOB: 1966/11/26 DOA: 12/17/2020 PCP: [name]    Status: Remains inpatient appropriate because:Ongoing active pain requiring inpatient pain management, Unsafe d/c plan and IV treatments appropriate due to intensity of illness or inability to take PO   Dispo:  The patient is from: homeless              Anticipated d/c is to: [facility] pending availability of 1st floor room              Anticipated d/c date is: > 3 days              Patient currently is not medically stable to d/c.  Barriers to discharge: Patient is homeless and currently has significant gait and ambulation issues secondary to recent bilateral amputation of toes.  Because of his polysubstance abuse he has yet to receive an SNF bed offer. Safety concern over his ongoing severe dysarthria/expressive aphasia and gait disturbance/high risk for falls if discharged too quickly back to the streets.  Concerns re: suspected misuse of Medicaid funds.  Currently working with social work -> pursuing guardianship/APS.   Difficult to place patient Yes   Level of care: Med-Surg  Code Status: Full Family Communication: Patient only DVT prophylaxis: Lovenox Vaccination status: Does not appear to be vaccinated against Covid  Foley catheter: No  HPI: 55 year old male with past medical history of cocaine abuse, bipolar 1, coronary artery disease, chronic systolic CHF, CVA presented with chest pain and became unresponsive with left-sided weakness and left gaze deviation.  He was intubated in the ED and eventually had MRI that showed showed acute ischemic nonhemorrhagic right superior cerebellar artery territory infarct, chronic L PICA distribution infarct.  He was extubated on 1/20 and transferred to [facility] service on 1/21.  It seems he was initially improving and neurology signed off, but code stroke was called on 1/26 due to AMS.  Repeat MRI showed acute infarct of R  superior cerebellar peduncle and right middle cerebellar peduncle.  Due to suicidal ideation, psych was c/s on 1/27 - at that time, they cleared him from psych standpoint.  Palliative care c/s was recommended as patient had been requesting to leave repeatedly despite ongoing needs for medical interventions and evaluation.  Palliative care saw pt on 1/28 and recommended full code, MOST to fill out prior to d/c (see note).  He's declined any of his children to be informed, but at one time was ok with [place] contacting his friend Alac.  Podiatry was consulted for his bilateral foot wounds and he underwent bilateral fist toe amputations on 2/2 by podiatry.  Psych was reconsulted on 2/14 and noted pt lacked capacity to make medical decisions.  Discharge has been limited by lack of safe discharge plan.  He's frequently refusing meds and refusing to participate with nurses.  Curses frequently at staff.  Psych c/s again on 2/26 and recommended ethics consultation and guardianship.  At this time discharge pending guardianship and safe d/c plan.     Subjective: He tells me he knows who I am, nothing is changed, and he asked me to leave the room without further explanations  Objective: Vitals:   01/31/21 1116 01/31/21 1419  BP:  (!) 135/97  Pulse:  96  Resp:  18  SpO2: 100%     Intake/Output Summary (Last 24 hours) at 02/01/2021 0855 Last data filed at 01/31/2021 1911 Gross per 24 hour  Intake 800 ml  Output 2375 ml  Net -1575 ml   Filed Weights   01/23/21 0314 01/27/21 0500 01/29/21 0434  Weight: 73.9 kg 76.2 kg 77.7 kg    Exam: Constitutional: In bed, belligerent  Assessment/Plan: Acute problems: Goals of care  Ethics: patient lacks capacity to make medical decisions based on previous evaluations by psychiatry and ongoing evaluations.  He's continued to refuse to cooperate with his care recently while i've taken care of him and he's frequently verbally abusive to staff, cursing.  He has difficulty  communicating related to his stroke and behavioral issues suspected related to his underlying bipolar/psychiatric illness.  Appreciate psych and ethics recommendations.  Working on guardianship with assistance of social work. See ethics note from 2/28 and psych note from 2/26 Of note, he currently doesn't meet criteria for IVC - per psychiatry, if he attempted to leave the facility, "at this point you have limited ability to stop him".  Per ethics, no justification for restrains if he is physically capable of leaving unless he's a danger to himself or others (see note). Capacity needs to be continually assessed   Acute ischemic stroke-right superior and middle cerebellar peduncle -Reviewed CT head and MRI brain and MRA head and neck -EEG shows diffuse encephalopathy -Continue statin; Continue aspirin and Plavix for 3 weeks (LD due 2/11) and then aspirin alone as per neurology's recommendation.   -1/25: Due to persistent right-sided weakness repeated CT head, MRI and EEG with no acute findings. -A1c: 6.4%, LDL: 72, echo shows ejection fraction less than 20% with grade 2 diastolic dysfunction -Neurology signed off -PT/OT recommended SNF but with patient's polysubstance abuse history he has not received any bed offers -PT continues to recommend SNF.  At this juncture best option would be to focus on more aggressive therapy until patient able to manage independently in long-term hotel room as described above. -Repeat MRI on 2/8 as below-no acute infarct-suspect dysarthria and dystonia secondary to side effects from Lyrica as described below -2/17 reconsulted SLP in regards to cognition and speech with ongoing dysarthria -Given significant communication needs and mobility needs have requested one-to-one safety sitter to assist with this - will continue.    Bipolar disorder/homelessness  Decision Making Capacity: -Poor compliance.  Homelessness.   -Consulted psychiatry early during the  hospitalization-evaluated and cleared him a danger to himself or to others.  Repeat evaluation on 2/14.  Patient had improved insight but lacked capacity to make medical decisions.  He was able to state to the psychiatric provider that he agreed to continue to stay in the hospital "I need to do what is right". - appreciate psychiatry consultation 2/26 -> they're recommending ethics consult (as above) - gabapentin (uptitrate), recommended initiation of SSRI (please follow up with psych in setting of QT prolongation), difficult situation if he tries to leave, limited ability to stop him (see psych note), recommended negotiation and consultation -Continue Depakote -No typical antipsychotics based on history of prolonged QTC and underlying LV dysfunction  Acute dysarthria with dystonia -MRI negative for acute changes -Symptoms resolved after discontinuation of Lyrica -Has mild residual dystonia likely related to recent cerebellar stroke -Typically antipsychotics are used to treat dystonia (ballisma) but unable to use 2/2 prolonged qtc-can use AEDs and pt currently on Keppra and Depakote to treat seizure d/o and Bipolar d/o. -2/23 will increase Depakote to 750 mg daily given increase in mood swings  Bilateral great toe ulcers/osteomyelitis noted in left/status post multiple toe amputations: -S/p bilateral great toe amputation on 2/2 via podiatry -2/4 resumed DVT prophylaxis  with Lovenox -Discussed with TOC pharmacist changed OxyContin to long-acting morphine.  Preauthorization forms completed and have been faxed. -Continue Cipro for a total of 2 weeks with last date 2/19 - podiatrist remove staples on 2/18 -2/22 reporting significant burning pain in both feet.  Had significant dystonia related to Lyrica.  Started low-dose Neurontin 100 mg 3 times daily- uptitrate -Continue MS Contin every 12 hours, OxyIR for breakthrough pain and Robaxin as adjunct of therapy  Chronic combined systolic and  diastolic heart failure with ejection fraction of less than 20% and grade 2 diastolic dysfunction: -Likely cocaine induced -Euvolemic -Continue low-dose Coreg, on 2/4 given suboptimal blood pressure reading Imdur changed to shorter acting low-dose isosorbide.  Hydralazine was also discontinued.  Continue Entresto; continue Aldactone -Strict I/O's and daily weight - will liberalize diet per his request - will restart lasix, try to check BMP -Follow-up with cardiology outpatient-appreciate cardiology's recommendation  Suspected Seizures: -EEG from 1/17 w/ diffuse encephalopathy, nonspecific etiology.  EEG from 1/19 showed evidence of epileptogenicity arising from right more than left frontal region. -EEG on 1/26 shows no seizures or epileptiform discharges were seen throughout the recording. -Continue Keppra 500 mg twice daily as per neurology's recommendations  Coronary artery disease: -Cardiology recommended outpatient cath at least 1 month after recovery from stroke. -Continue aspirin and atorvastatin  Physical deconditioning/gait instability -making progress with therapy, continue -We will order rolling walker and wheelchair noting patient is at high risk for falls although he is making progress needing only minimal assist to come to stand with rolling walker and minimal to mod assist x2 to stand step to the right with a rolling walker -Plan is to remain inpatient until gait and ambulation maximized -Safety sitter requested as above  Other problems: Acute metabolic/toxic encephalopathy: -Multifactorial in the setting of stroke, drug use, respiratory failure and sedation -Resolved -Continue folic acid, thiamine and multivitamin -Seen by psychiatry-appreciate help  Hypertension:  -continue meds - he's been refusing medications -Monitor blood pressure closely  Acute transaminitis -LFTs as of 2/3 are within normal limits except for low albumin of 3.1 -Elevated LFTs likely related  to shock liver phases of admission the mild elevation of total bilirubin to 1.7 therefore not consistent with acute liver failure  Oral pain w/ upper palate edema -Orthopantogram unremarkable -As of 2/10 is resolved  Cocaine use:  Counseled about cessation  Hyponatremia:  Resolved  Bipolar disorder/homelessness: -Poor compliance.  Homelessness.   Consulted psychiatry-evaluated and cleared him a danger to himself or to others. -Continue Depakote  Hyperlipidemia:  Continue statin.  Data Reviewed: Basic Metabolic Panel: Recent Labs  Lab 01/30/21 1004  NA 141  K 3.9  CL 106  CO2 24  GLUCOSE 126*  BUN 14  CREATININE 0.98  CALCIUM 9.0   Liver Function Tests: No results for input(s): AST, ALT, ALKPHOS, BILITOT, PROT, ALBUMIN in the last 168 hours. No results for input(s): LIPASE, AMYLASE in the last 168 hours. No results for input(s): AMMONIA in the last 168 hours. CBC: No results for input(s): WBC, NEUTROABS, HGB, HCT, MCV, PLT in the last 168 hours. Cardiac Enzymes: No results for input(s): CKTOTAL, CKMB, CKMBINDEX, TROPONINI in the last 168 hours. BNP (last 3 results) No results for input(s): BNP in the last 8760 hours.  ProBNP (last 3 results) No results for input(s): PROBNP in the last 8760 hours.  CBG: No results for input(s): GLUCAP in the last 168 hours.  No results found for this or any previous visit (from the past 240  hour(s)).   Studies: No results found.  Scheduled Meds: . aspirin  81 mg Oral Daily  . atorvastatin  40 mg Oral Daily  . carvedilol  6.25 mg Oral BID WC  . cetaphil   Topical Daily  . divalproex  750 mg Oral Daily  . docusate sodium  100 mg Oral Daily  . enoxaparin (LOVENOX) injection  40 mg Subcutaneous Q24H  . folic acid  1 mg Oral Daily  . furosemide  40 mg Oral Daily  . gabapentin  600 mg Oral TID  . isosorbide dinitrate  5 mg Oral TID  . levETIRAcetam  500 mg Oral BID WC  . magnesium oxide  400 mg Oral BID WC  .  methocarbamol  500 mg Oral TID WC  . morphine  30 mg Oral Q12H  . multivitamin with minerals  1 tablet Oral Daily  . sacubitril-valsartan  1 tablet Oral BID WC  . silver sulfADIAZINE   Topical BID WC  . spironolactone  12.5 mg Oral Daily  . thiamine  100 mg Oral Daily   Continuous Infusions: . lactated ringers Stopped (01/02/21 1552)    Principal Problem:   Ischemic stroke (HCC) Active Problems:   Basilar artery embolus   Acute on chronic combined systolic and diastolic CHF (congestive heart failure) (HCC)   Respiratory failure (HCC)   Ulcer of great toe, left, with necrosis of bone (HCC)   Ulcer of great toe, right, with necrosis of muscle (HCC)   Acute osteomyelitis of toe, left (HCC)   Osteomyelitis of great toe (HCC)   Shock liver   Acute metabolic encephalopathy   CAD (coronary artery disease)   HTN (hypertension)   Bipolar I disorder (HCC)   Cocaine abuse (HCC)   Homelessness   HLD (hyperlipidemia)   Dystonia   Dysarthria   Pain, dental   Encounter for assessment of healthcare decision-making capacity   Consultants:  Neurology  Cardiology  Podiatry  Palliative  Procedures: Bilateral great toe amputation  Antibiotics: None   Pamella Pert MD [facility] 7 am - 330 pm/M-F for direct patient care and secure chat Please refer to Amion for contact info 46  days

## 2021-02-01 NOTE — Progress Notes (Addendum)
PT Cancellation Note  Patient Details Name: [name] MRN: [id number] DOB: Aug 03, 1966   Cancelled Treatment:    Reason Eval/Treat Not Completed: Patient declined, no reason specified At 1400, patient initially in agreement with PT/OT co-treatment session. Patient received call on hospital phone and immediately began shouting obscenities at this therapist to leave so he could take the phone call. At 1435, patient noted to be standing at nurses station barefoot and with RW shouting obscenities at all staff members. Staff attempting to get patient back to his room but patient is very belligerent and refusing all assistance. Patient threatening staff including this therapist and OT. Patient observed ambulating without assistance and presumably ambulated from bed to RW by door of hospital room without assistance. PT will re-attempt as time allows.  Patient has all necessary DME including wheelchair, RW, and BSC. Patient is aware of balance deficits and need for RW with functional mobility. Patient will continue to require supervision A grossly for safety with mobility and is not safe to be independent given cognitive deficits. PT to sign off at this time.   Elnora Quizon A. [name] PT, DPT [facility] Pager [phone] Office [phone]    [name] 02/01/2021, 2:45 PM

## 2021-02-02 NOTE — Progress Notes (Signed)
Patient is extremely agitated, yelling at nurse and tech, refusing vitals, assessment and medications.  Charge nurse and MD updated.

## 2021-02-02 NOTE — Progress Notes (Signed)
Patient agitated and uncooperative with care, refusing assessments.

## 2021-02-02 NOTE — Progress Notes (Signed)
Patient has become increasingly agitated and combative with staff throughout the morning and afternoon. Patient "fired" primary RN this morning, later "fired" charge nurse attempting to provide care and medications. Patient has refused all assessments and most medications.   Security had to be called to escort patient back to room after he came up to nurse station yelling obscene words at staff members. MD has been updated. Goal is to maintain safety on the department for patient and staff.

## 2021-02-02 NOTE — Progress Notes (Signed)
Call to the unit for patient's disruptive aggressive behavior. [employer] in the hall. Patient had asked to speak to the [facility]. Patient only wanted to talk about his side of the story stated that the nurse was trying to give a medication that he is allergic too.  Patient did allow this nurse to give most of medications that were due at this time.  Refused  Magnesium and Entresto. [employer] also present. Ativan PO was also given as patient was anxious and  becoming loud. Unsuccessful at attempting to set behavior limits. Patient agreed to stay calm and in his room for a brief time. Asked to have the same night  Nurse as last night informed that I would pass it along to charge nurse but was not sure if that nurse is scheduled tonight. Lying in bed with call bell next to him.

## 2021-02-02 NOTE — Progress Notes (Signed)
Patient continues to be agitated, combative and uncooperative with care, yelling at staff, refusing assessments. MD updated.

## 2021-02-02 NOTE — Progress Notes (Signed)
[facility] PROGRESS NOTE  [name] ZOX:[id number] DOB: 26-Dec-1965 DOA: 12/17/2020 PCP: [name]    Status: Remains inpatient appropriate because:Ongoing active pain requiring inpatient pain management, Unsafe d/c plan and IV treatments appropriate due to intensity of illness or inability to take PO   Dispo:  The patient is from: homeless              Anticipated d/c is to: [facility] pending availability of 1st floor room              Anticipated d/c date is: > 3 days              Patient currently is not medically stable to d/c.  Barriers to discharge: Patient is homeless and currently has significant gait and ambulation issues secondary to recent bilateral amputation of toes.  Because of his polysubstance abuse he has yet to receive an SNF bed offer. Safety concern over his ongoing severe dysarthria/expressive aphasia and gait disturbance/high risk for falls if discharged too quickly back to the streets.  Concerns re: suspected misuse of Medicaid funds.  Currently working with social work -> pursuing guardianship/APS.   Difficult to place patient Yes   Level of care: Med-Surg  Code Status: Full Family Communication: Patient only DVT prophylaxis: Lovenox Vaccination status: Does not appear to be vaccinated against Covid  Foley catheter: No  HPI: 55 year old male with past medical history of cocaine abuse, bipolar 1, coronary artery disease, chronic systolic CHF, CVA presented with chest pain and became unresponsive with left-sided weakness and left gaze deviation.  He was intubated in the ED and eventually had MRI that showed showed acute ischemic nonhemorrhagic right superior cerebellar artery territory infarct, chronic L PICA distribution infarct.  He was extubated on 1/20 and transferred to [facility] service on 1/21.  It seems he was initially improving and neurology signed off, but code stroke was called on 1/26 due to AMS.  Repeat MRI showed acute infarct of R  superior cerebellar peduncle and right middle cerebellar peduncle.  Due to suicidal ideation, psych was c/s on 1/27 - at that time, they cleared him from psych standpoint.  Palliative care c/s was recommended as patient had been requesting to leave repeatedly despite ongoing needs for medical interventions and evaluation.  Palliative care saw pt on 1/28 and recommended full code, MOST to fill out prior to d/c (see note).  He's declined any of his children to be informed, but at one time was ok with [place] contacting his friend Alac.  Podiatry was consulted for his bilateral foot wounds and he underwent bilateral fist toe amputations on 2/2 by podiatry.  Psych was reconsulted on 2/14 and noted pt lacked capacity to make medical decisions.  Discharge has been limited by lack of safe discharge plan.  He's frequently refusing meds and refusing to participate with nurses.  Curses frequently at staff.  Psych c/s again on 2/26 and recommended ethics consultation and guardianship.  At this time discharge pending guardianship and safe d/c plan.     Psychiatry reconsulted 3/3 "patient does not meet criteria for involuntary commitment as he denies any suicidal or homicidal ideations.  He denies any auditory or visual hallucinations and does not seem like responding to any internal stimuli.  His inability to care for himself or poor decision-making is not enough to involuntarily commit him.  From psychiatric point of view patient does not need any psych inpatient hospitalization and cannot be IVC'd."  Subjective: As soon as I  entered the room he tells me to close the door on my way out.  Objective: Vitals:   02/01/21 2317 02/02/21 0342  BP: 137/77   Pulse: 88 79  Resp:  18  SpO2:  99%    Intake/Output Summary (Last 24 hours) at 02/02/2021 0856 Last data filed at 02/01/2021 2347 Gross per 24 hour  Intake 240 ml  Output -  Net 240 ml   Filed Weights   01/23/21 0314 01/27/21 0500 01/29/21 0434  Weight: 73.9 kg  76.2 kg 77.7 kg    Exam: Constitutional: In bed, belligerent  Assessment/Plan: Acute problems: Goals of care  Ethics: patient lacks capacity to make medical decisions based on previous evaluations by psychiatry and ongoing evaluations.  He's continued to refuse to cooperate with his care recently while i've taken care of him and he's frequently verbally abusive to staff, cursing.  He has difficulty communicating related to his stroke and behavioral issues suspected related to his underlying bipolar/psychiatric illness.  Appreciate psych and ethics recommendations.  Working on guardianship with assistance of social work. Capacity needs to be continually assessed   Acute ischemic stroke-right superior and middle cerebellar peduncle -Reviewed CT head and MRI brain and MRA head and neck -EEG shows diffuse encephalopathy -Continue statin; Continue aspirin and Plavix for 3 weeks (LD due 2/11) and then aspirin alone as per neurology's recommendation.   -1/25: Due to persistent right-sided weakness repeated CT head, MRI and EEG with no acute findings. -A1c: 6.4%, LDL: 72, echo shows ejection fraction less than 20% with grade 2 diastolic dysfunction -Neurology signed off -PT/OT recommended SNF but with patient's polysubstance abuse history he has not received any bed offers -PT continues to recommend SNF.  At this juncture best option would be to focus on more aggressive therapy until patient able to manage independently in long-term hotel room as described above. -Repeat MRI on 2/8 as below-no acute infarct-suspect dysarthria and dystonia secondary to side effects from Lyrica as described below -2/17 reconsulted SLP in regards to cognition and speech with ongoing dysarthria -Given significant communication needs and mobility needs have requested one-to-one safety sitter to assist with this - will continue.    Bipolar disorder/homelessness  Decision Making Capacity: -Poor compliance.  Homelessness.    -Consulted psychiatry early during the hospitalization-evaluated and cleared him a danger to himself or to others.  Repeat evaluation on 2/14.  Patient had improved insight but lacked capacity to make medical decisions.  He was able to state to the psychiatric provider that he agreed to continue to stay in the hospital "I need to do what is right". - appreciate psychiatry consultation 2/26 -> they're recommending ethics consult (as above) - gabapentin (uptitrate), recommended initiation of SSRI (please follow up with psych in setting of QT prolongation), difficult situation if he tries to leave, limited ability to stop him (see psych note), recommended negotiation and consultation -Continue Depakote -No typical antipsychotics based on history of prolonged QTC and underlying LV dysfunction  Acute dysarthria with dystonia -MRI negative for acute changes -Symptoms resolved after discontinuation of Lyrica -Has mild residual dystonia likely related to recent cerebellar stroke -Typically antipsychotics are used to treat dystonia (ballisma) but unable to use 2/2 prolonged qtc-can use AEDs and pt currently on Keppra and Depakote to treat seizure d/o and Bipolar d/o. -2/23 will increase Depakote to 750 mg daily given increase in mood swings  Bilateral great toe ulcers/osteomyelitis noted in left/status post multiple toe amputations: -S/p bilateral great toe amputation on 2/2 via  podiatry -2/4 resumed DVT prophylaxis with Lovenox -Discussed with TOC pharmacist changed OxyContin to long-acting morphine.  Preauthorization forms completed and have been faxed. -Continue Cipro for a total of 2 weeks with last date 2/19 - podiatrist remove staples on 2/18 -2/22 reporting significant burning pain in both feet.  Had significant dystonia related to Lyrica.  Started low-dose Neurontin 100 mg 3 times daily- uptitrate -Continue MS Contin every 12 hours, OxyIR for breakthrough pain and Robaxin as adjunct of  therapy  Chronic combined systolic and diastolic heart failure with ejection fraction of less than 20% and grade 2 diastolic dysfunction: -Likely cocaine induced -Euvolemic -Continue low-dose Coreg, on 2/4 given suboptimal blood pressure reading Imdur changed to shorter acting low-dose isosorbide.  Hydralazine was also discontinued.  Continue Entresto; continue Aldactone -Strict I/O's and daily weight - will liberalize diet per his request - will restart lasix, try to check BMP -Follow-up with cardiology outpatient-appreciate cardiology's recommendation  Suspected Seizures: -EEG from 1/17 w/ diffuse encephalopathy, nonspecific etiology.  EEG from 1/19 showed evidence of epileptogenicity arising from right more than left frontal region. -EEG on 1/26 shows no seizures or epileptiform discharges were seen throughout the recording. -Continue Keppra 500 mg twice daily as per neurology's recommendations  Coronary artery disease: -Cardiology recommended outpatient cath at least 1 month after recovery from stroke. -Continue aspirin and atorvastatin  Physical deconditioning/gait instability -making progress with therapy, continue -We will order rolling walker and wheelchair noting patient is at high risk for falls although he is making progress needing only minimal assist to come to stand with rolling walker and minimal to mod assist x2 to stand step to the right with a rolling walker -Plan is to remain inpatient until gait and ambulation maximized -Safety sitter requested as above  Other problems: Acute metabolic/toxic encephalopathy: -Multifactorial in the setting of stroke, drug use, respiratory failure and sedation -Resolved -Continue folic acid, thiamine and multivitamin -Seen by psychiatry-appreciate help  Hypertension:  -continue meds - he's been refusing medications -Monitor blood pressure closely  Acute transaminitis -LFTs as of 2/3 are within normal limits except for low  albumin of 3.1 -Elevated LFTs likely related to shock liver phases of admission the mild elevation of total bilirubin to 1.7 therefore not consistent with acute liver failure  Oral pain w/ upper palate edema -Orthopantogram unremarkable -As of 2/10 is resolved  Cocaine use:  Counseled about cessation  Hyponatremia:  Resolved  Bipolar disorder/homelessness: -Poor compliance.  Homelessness.   Consulted psychiatry-evaluated and cleared him a danger to himself or to others. -Continue Depakote  Hyperlipidemia:  Continue statin.  Data Reviewed: Basic Metabolic Panel: Recent Labs  Lab 01/30/21 1004  NA 141  K 3.9  CL 106  CO2 24  GLUCOSE 126*  BUN 14  CREATININE 0.98  CALCIUM 9.0   Liver Function Tests: No results for input(s): AST, ALT, ALKPHOS, BILITOT, PROT, ALBUMIN in the last 168 hours. No results for input(s): LIPASE, AMYLASE in the last 168 hours. No results for input(s): AMMONIA in the last 168 hours. CBC: No results for input(s): WBC, NEUTROABS, HGB, HCT, MCV, PLT in the last 168 hours. Cardiac Enzymes: No results for input(s): CKTOTAL, CKMB, CKMBINDEX, TROPONINI in the last 168 hours. BNP (last 3 results) No results for input(s): BNP in the last 8760 hours.  ProBNP (last 3 results) No results for input(s): PROBNP in the last 8760 hours.  CBG: No results for input(s): GLUCAP in the last 168 hours.  No results found for this or any previous  visit (from the past 240 hour(s)).   Studies: No results found.  Scheduled Meds: . aspirin  81 mg Oral Daily  . atorvastatin  40 mg Oral Daily  . carvedilol  6.25 mg Oral BID WC  . cetaphil   Topical Daily  . divalproex  750 mg Oral Daily  . docusate sodium  100 mg Oral Daily  . enoxaparin (LOVENOX) injection  40 mg Subcutaneous Q24H  . folic acid  1 mg Oral Daily  . furosemide  40 mg Oral Daily  . gabapentin  600 mg Oral TID  . isosorbide dinitrate  5 mg Oral TID  . levETIRAcetam  500 mg Oral BID WC  .  magnesium oxide  400 mg Oral BID WC  . methocarbamol  500 mg Oral TID WC  . morphine  30 mg Oral Q12H  . multivitamin with minerals  1 tablet Oral Daily  . sacubitril-valsartan  1 tablet Oral BID WC  . silver sulfADIAZINE   Topical BID WC  . spironolactone  12.5 mg Oral Daily  . thiamine  100 mg Oral Daily   Continuous Infusions: . lactated ringers Stopped (01/02/21 1552)    Principal Problem:   Ischemic stroke (HCC) Active Problems:   Basilar artery embolus   Acute on chronic combined systolic and diastolic CHF (congestive heart failure) (HCC)   Respiratory failure (HCC)   Ulcer of great toe, left, with necrosis of bone (HCC)   Ulcer of great toe, right, with necrosis of muscle (HCC)   Acute osteomyelitis of toe, left (HCC)   Osteomyelitis of great toe (HCC)   Shock liver   Acute metabolic encephalopathy   CAD (coronary artery disease)   HTN (hypertension)   Bipolar I disorder (HCC)   Cocaine abuse (HCC)   Homelessness   HLD (hyperlipidemia)   Dystonia   Dysarthria   Pain, dental   Encounter for assessment of healthcare decision-making capacity   Consultants:  Neurology  Cardiology  Podiatry  Palliative  Procedures: Bilateral great toe amputation  Antibiotics: None   Pamella Pert MD [facility] 7 am - 330 pm/M-F for direct patient care and secure chat Please refer to Amion for contact info 47  days

## 2021-02-03 MED ORDER — CLOPIDOGREL BISULFATE 75 MG PO TABS: 75.0000 mg | ORAL_TABLET | Freq: Every day | ORAL | 0 refills | Status: DC

## 2021-02-03 MED ORDER — ATORVASTATIN CALCIUM 40 MG PO TABS: 40.0000 mg | ORAL_TABLET | Freq: Every day | ORAL | 0 refills | Status: DC

## 2021-02-03 MED ORDER — MAGNESIUM OXIDE 400 (241.3 MG) MG PO TABS: 400.0000 mg | ORAL_TABLET | Freq: Two times a day (BID) | ORAL | 0 refills | Status: DC

## 2021-02-03 MED ORDER — CARVEDILOL 3.125 MG PO TABS: 3.1250 mg | ORAL_TABLET | Freq: Two times a day (BID) | ORAL | 0 refills | Status: DC

## 2021-02-03 MED ORDER — GABAPENTIN 300 MG PO CAPS: 600.0000 mg | ORAL_CAPSULE | Freq: Three times a day (TID) | ORAL | 0 refills | Status: DC

## 2021-02-03 MED ORDER — DIVALPROEX SODIUM ER 500 MG PO TB24: 500.0000 mg | ORAL_TABLET | Freq: Two times a day (BID) | ORAL | 0 refills | Status: DC

## 2021-02-03 MED ORDER — ISOSORBIDE DINITRATE 5 MG PO TABS: 5.0000 mg | ORAL_TABLET | Freq: Three times a day (TID) | ORAL | 0 refills | Status: DC

## 2021-02-03 MED ORDER — ASPIRIN 81 MG PO CHEW: 81.0000 mg | CHEWABLE_TABLET | Freq: Every day | ORAL | 0 refills | Status: DC

## 2021-02-03 MED ORDER — SACUBITRIL-VALSARTAN 24-26 MG PO TABS: 1.0000 | ORAL_TABLET | Freq: Two times a day (BID) | ORAL | 0 refills | Status: DC

## 2021-02-03 MED ORDER — LEVETIRACETAM 500 MG PO TABS: 500.0000 mg | ORAL_TABLET | Freq: Two times a day (BID) | ORAL | 0 refills | Status: DC

## 2021-02-03 MED ORDER — FUROSEMIDE 40 MG PO TABS: 40.0000 mg | ORAL_TABLET | Freq: Every day | ORAL | 0 refills | Status: DC
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
## 2021-02-03 NOTE — Progress Notes (Signed)
Upon entering the room the patient immediately demands that I leave the room. States he doesn't want any medication or anything from me. MD aware.

## 2021-02-03 NOTE — TOC Transition Note (Signed)
Transition of Care [facility]) - CM/SW Discharge Note   Patient Details  Name: [name] MRN: [id number] Date of Birth: Feb 13, 1966  Transition of [facility]) CM/SW Contact:  Carley Hammed, LCSWA Phone Number: 02/03/2021, 10:39 AM   Clinical Narrative:    [place]. He was provided with a resource list and bus pass. No other needs were identified at this time. SW will sign off.   Final next level of care: Home/Self Care Barriers to Discharge: Homeless with medical needs,Continued Medical Work up   Patient Goals and CMS Choice Patient states their goals for this hospitalization and ongoing recovery are:: "I want my pain to get better". CMS Medicare.gov Compare Post Acute Care list provided to:: Patient Choice offered to / list presented to : Patient  Discharge Placement                       Discharge Plan and Services In-house Referral: Clinical Social Work Discharge Planning Services: CM Consult,Indigent Health Clinic,Medication Assistance Post Acute Care Choice: Durable Medical Equipment          DME Arranged: Dan Humphreys rolling,Wheelchair manual DME Agency: [facility]     Representative spoke with at DME Agency: Will contact [employer] to order and deliver when closer to discharge to home.            Social Determinants of Health (SDOH) Interventions     Readmission Risk Interventions Readmission Risk Prevention Plan 01/07/2021  Transportation Screening Complete  PCP or Specialist Appt within 3-5 Days Complete  HRI or Home Care Consult Complete  Social Work Consult for Recovery Care Planning/Counseling Complete  Palliative Care Screening Complete  Medication Review [employer]) Complete

## 2021-02-03 NOTE — Progress Notes (Signed)
Patient refused AM assessment

## 2021-02-03 NOTE — Progress Notes (Signed)
Spoke with patients brother "Demetrius" by phone and advised him of the patients discharge. Demetrius stated ok Kandee Keen will get in touch with me.

## 2021-02-04 ENCOUNTER — Encounter (HOSPITAL_COMMUNITY): Payer: Self-pay | Admitting: Family Medicine

## 2021-02-06 ENCOUNTER — Other Ambulatory Visit: Payer: Self-pay

## 2021-02-06 ENCOUNTER — Ambulatory Visit: Payer: Medicaid Other | Attending: Physician Assistant | Admitting: Physician Assistant
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
## 2021-02-09 ENCOUNTER — Emergency Department (HOSPITAL_COMMUNITY): Payer: Medicaid Other

## 2021-02-09 ENCOUNTER — Encounter (HOSPITAL_COMMUNITY): Payer: Self-pay | Admitting: Emergency Medicine

## 2021-02-09 ENCOUNTER — Emergency Department (HOSPITAL_COMMUNITY)
Admission: EM | Admit: 2021-02-09 | Discharge: 2021-02-10 | Disposition: A | Discharge status: Home / Self Care | Payer: Medicaid Other | Attending: Emergency Medicine | Admitting: Emergency Medicine

## 2021-02-09 DIAGNOSIS — R45851 Suicidal ideations: Secondary | ICD-10-CM | POA: Diagnosis not present

## 2021-02-09 DIAGNOSIS — M79674 Pain in right toe(s): Secondary | ICD-10-CM | POA: Diagnosis not present

## 2021-02-09 DIAGNOSIS — M79675 Pain in left toe(s): Secondary | ICD-10-CM | POA: Diagnosis not present

## 2021-02-09 DIAGNOSIS — Z7982 Long term (current) use of aspirin: Secondary | ICD-10-CM | POA: Insufficient documentation

## 2021-02-09 DIAGNOSIS — Z91010 Allergy to peanuts: Secondary | ICD-10-CM | POA: Insufficient documentation

## 2021-02-09 DIAGNOSIS — Z91040 Latex allergy status: Secondary | ICD-10-CM | POA: Insufficient documentation

## 2021-02-09 DIAGNOSIS — J449 Chronic obstructive pulmonary disease, unspecified: Secondary | ICD-10-CM | POA: Diagnosis not present

## 2021-02-09 DIAGNOSIS — F1414 Cocaine abuse with cocaine-induced mood disorder: Secondary | ICD-10-CM | POA: Diagnosis not present

## 2021-02-09 DIAGNOSIS — J45909 Unspecified asthma, uncomplicated: Secondary | ICD-10-CM | POA: Insufficient documentation

## 2021-02-09 DIAGNOSIS — Z59811 Housing instability, housed, with risk of homelessness: Secondary | ICD-10-CM | POA: Diagnosis not present

## 2021-02-09 DIAGNOSIS — R440 Auditory hallucinations: Secondary | ICD-10-CM | POA: Insufficient documentation

## 2021-02-09 DIAGNOSIS — Z599 Problem related to housing and economic circumstances, unspecified: Secondary | ICD-10-CM

## 2021-02-09 DIAGNOSIS — Z79899 Other long term (current) drug therapy: Secondary | ICD-10-CM | POA: Diagnosis not present

## 2021-02-09 DIAGNOSIS — Z7951 Long term (current) use of inhaled steroids: Secondary | ICD-10-CM | POA: Insufficient documentation

## 2021-02-09 DIAGNOSIS — E11621 Type 2 diabetes mellitus with foot ulcer: Secondary | ICD-10-CM | POA: Insufficient documentation

## 2021-02-09 DIAGNOSIS — Z20822 Contact with and (suspected) exposure to covid-19: Secondary | ICD-10-CM | POA: Diagnosis not present

## 2021-02-09 DIAGNOSIS — Z59819 Housing instability, housed unspecified: Secondary | ICD-10-CM

## 2021-02-09 DIAGNOSIS — I110 Hypertensive heart disease with heart failure: Secondary | ICD-10-CM | POA: Insufficient documentation

## 2021-02-09 DIAGNOSIS — F17210 Nicotine dependence, cigarettes, uncomplicated: Secondary | ICD-10-CM | POA: Diagnosis not present

## 2021-02-09 DIAGNOSIS — L97504 Non-pressure chronic ulcer of other part of unspecified foot with necrosis of bone: Secondary | ICD-10-CM | POA: Insufficient documentation

## 2021-02-09 DIAGNOSIS — I5043 Acute on chronic combined systolic (congestive) and diastolic (congestive) heart failure: Secondary | ICD-10-CM | POA: Insufficient documentation

## 2021-02-09 DIAGNOSIS — R451 Restlessness and agitation: Secondary | ICD-10-CM | POA: Insufficient documentation

## 2021-02-09 DIAGNOSIS — F250 Schizoaffective disorder, bipolar type: Secondary | ICD-10-CM | POA: Diagnosis not present

## 2021-02-09 DIAGNOSIS — I2510 Atherosclerotic heart disease of native coronary artery without angina pectoris: Secondary | ICD-10-CM | POA: Diagnosis not present

## 2021-02-09 DIAGNOSIS — R531 Weakness: Secondary | ICD-10-CM | POA: Diagnosis present

## 2021-02-09 LAB — RAPID URINE DRUG SCREEN, HOSP PERFORMED
Amphetamines: NOT DETECTED
Barbiturates: NOT DETECTED
Benzodiazepines: NOT DETECTED
Cocaine: POSITIVE — AB
Opiates: NOT DETECTED
Tetrahydrocannabinol: NOT DETECTED

## 2021-02-09 LAB — COMPREHENSIVE METABOLIC PANEL
ALT: 18 U/L (ref 0–44)
AST: 20 U/L (ref 15–41)
Albumin: 3.6 g/dL (ref 3.5–5.0)
Alkaline Phosphatase: 53 U/L (ref 38–126)
Anion gap: 12 (ref 5–15)
BUN: 15 mg/dL (ref 6–20)
CO2: 21 mmol/L — ABNORMAL LOW (ref 22–32)
Calcium: 9.2 mg/dL (ref 8.9–10.3)
Chloride: 106 mmol/L (ref 98–111)
Creatinine, Ser: 0.92 mg/dL (ref 0.61–1.24)
GFR, Estimated: >60 mL/min (ref >60)
Glucose, Bld: 150 mg/dL — ABNORMAL HIGH (ref 70–99)
Potassium: 3.7 mmol/L (ref 3.5–5.1)
Sodium: 139 mmol/L (ref 135–145)
Total Bilirubin: 1 mg/dL (ref 0.3–1.2)
Total Protein: 7.2 g/dL (ref 6.5–8.1)

## 2021-02-09 LAB — CBC WITH DIFFERENTIAL/PLATELET
Abs Immature Granulocytes: 0.01 10*3/uL (ref 0.00–0.07)
Basophils Absolute: 0 10*3/uL (ref 0.0–0.1)
Basophils Relative: 0 %
Eosinophils Absolute: 0.1 10*3/uL (ref 0.0–0.5)
Eosinophils Relative: 3 %
HCT: 43.2 % (ref 39.0–52.0)
Hemoglobin: 13.8 g/dL (ref 13.0–17.0)
Immature Granulocytes: 0 %
Lymphocytes Relative: 45 %
Lymphs Abs: 2.1 10*3/uL (ref 0.7–4.0)
MCH: 31.2 pg (ref 26.0–34.0)
MCHC: 31.9 g/dL (ref 30.0–36.0)
MCV: 97.5 fL (ref 80.0–100.0)
Monocytes Absolute: 0.5 10*3/uL (ref 0.1–1.0)
Monocytes Relative: 10 %
Neutro Abs: 2 10*3/uL (ref 1.7–7.7)
Neutrophils Relative %: 42 %
Platelets: 198 10*3/uL (ref 150–400)
RBC: 4.43 MIL/uL (ref 4.22–5.81)
RDW: 15.9 % — ABNORMAL HIGH (ref 11.5–15.5)
WBC: 4.7 10*3/uL (ref 4.0–10.5)
nRBC: 0 % (ref 0.0–0.2)

## 2021-02-09 LAB — URINALYSIS, ROUTINE W REFLEX MICROSCOPIC
Bacteria, UA: NONE SEEN
Bilirubin Urine: NEGATIVE
Glucose, UA: NEGATIVE mg/dL
Hgb urine dipstick: NEGATIVE
Ketones, ur: 5 mg/dL — AB
Leukocytes,Ua: NEGATIVE
Nitrite: NEGATIVE
Protein, ur: 30 mg/dL — AB
Specific Gravity, Urine: 1.03 (ref 1.005–1.030)
pH: 5 (ref 5.0–8.0)

## 2021-02-09 LAB — RESP PANEL BY RT-PCR (FLU A&B, COVID) ARPGX2
Influenza A by PCR: NEGATIVE
Influenza B by PCR: NEGATIVE
SARS Coronavirus 2 by RT PCR: NEGATIVE

## 2021-02-09 LAB — ETHANOL: Alcohol, Ethyl (B): <10 mg/dL (ref <10)

## 2021-02-09 LAB — VALPROIC ACID LEVEL: Valproic Acid Lvl: <10 ug/mL — ABNORMAL LOW (ref 50.0–100.0)

## 2021-02-09 LAB — SALICYLATE LEVEL: Salicylate Lvl: <7 mg/dL — ABNORMAL LOW (ref 7.0–30.0)

## 2021-02-09 LAB — ACETAMINOPHEN LEVEL: Acetaminophen (Tylenol), Serum: <10 ug/mL — ABNORMAL LOW (ref 10–30)

## 2021-02-09 IMAGING — CT CT HEAD W/O CM
3 series · 15 of 47 positions shown, 18 images · non-contrast
Comparison: CT [DATE].  MRI [DATE]

CLINICAL DATA: Stroke follow-up

EXAM:
CT HEAD WITHOUT CONTRAST
TECHNIQUE: Contiguous axial images were obtained from the base of the skull
through the vertex without intravenous contrast.

[Series 2: head wo · axial · 0.47mm/px · z∈[-133,-3]mm · 9 of 32 slices shown, 12 images]
[im 3/32  brain]
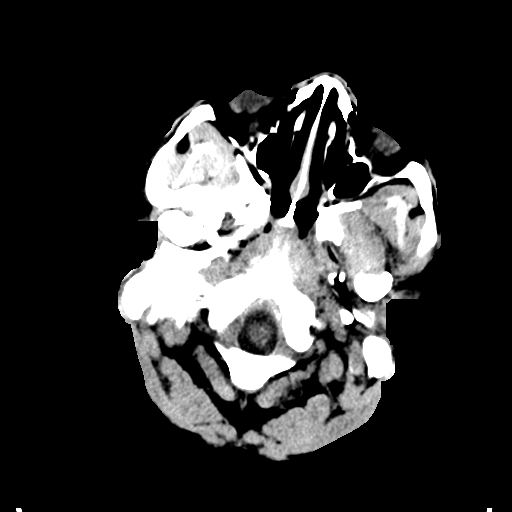
[im 3/32  bone]
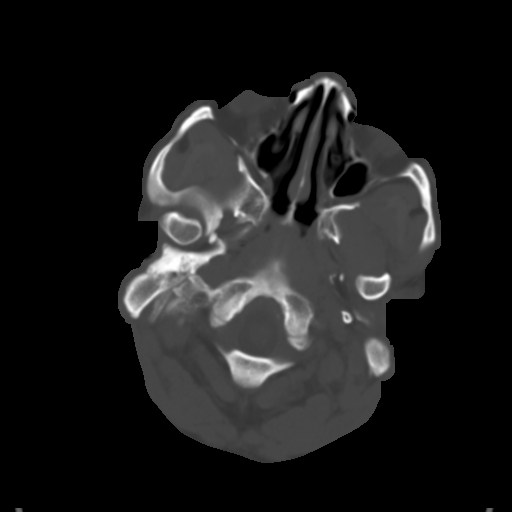
[im 6/32  brain]
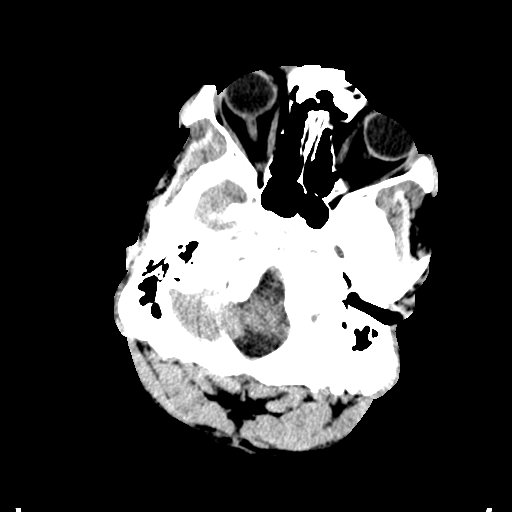
[im 9/32  brain]
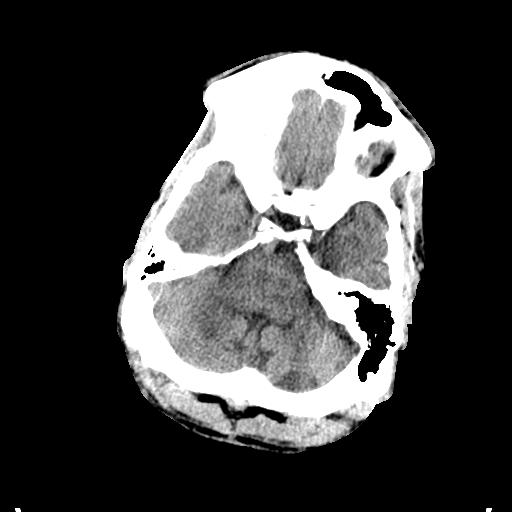
[im 12/32  brain]
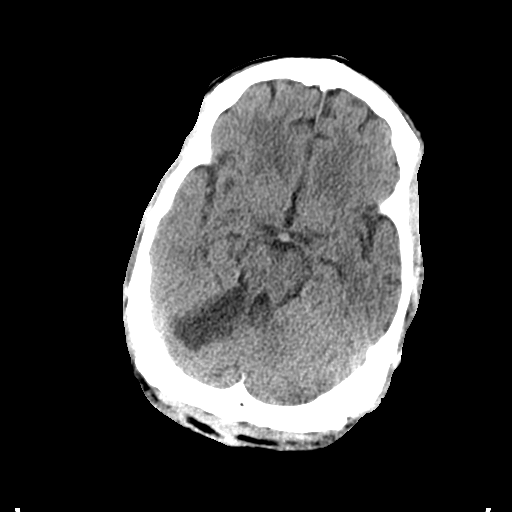
[im 17/32  brain]
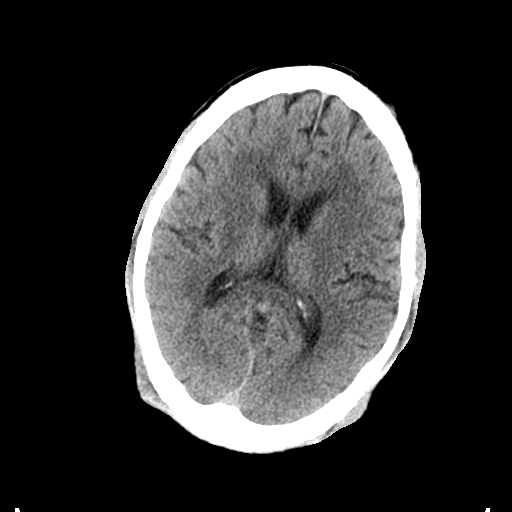
[im 17/32  bone]
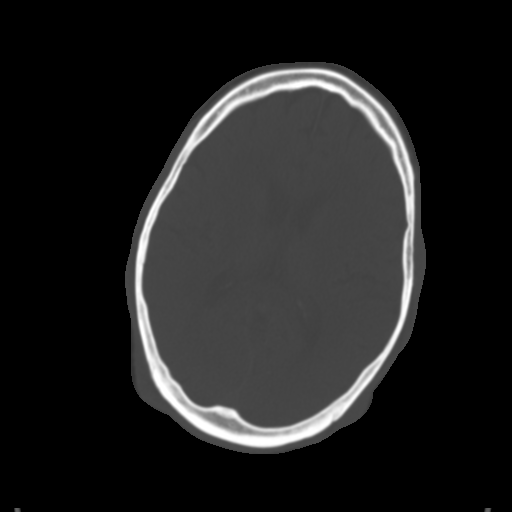
[im 20/32  brain]
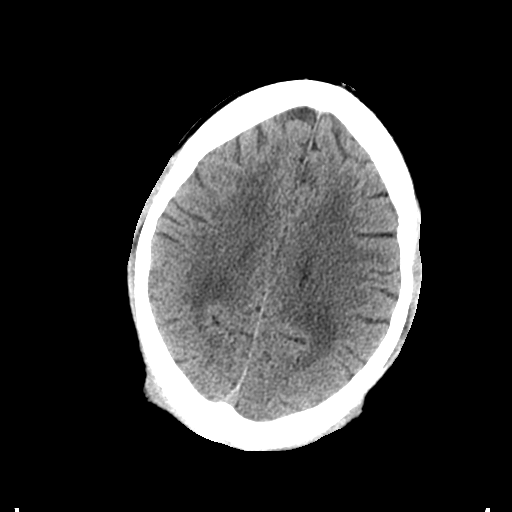
[im 23/32  brain]
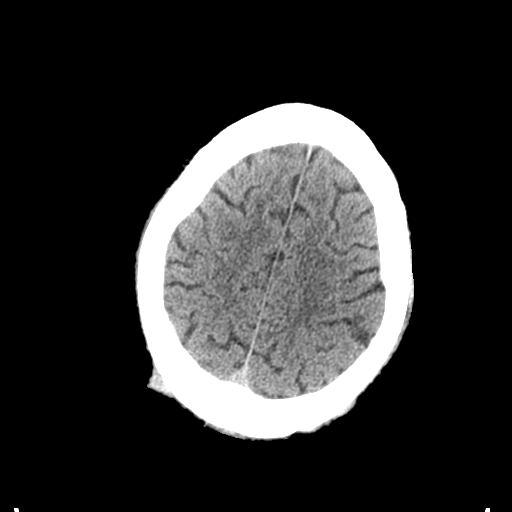
[im 26/32  brain]
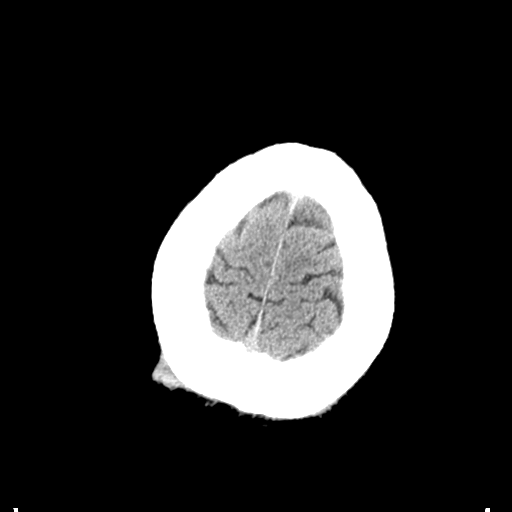
[im 29/32  brain]
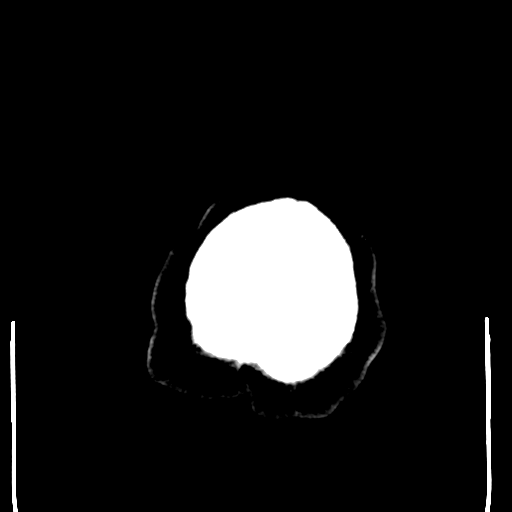
[im 29/32  bone]
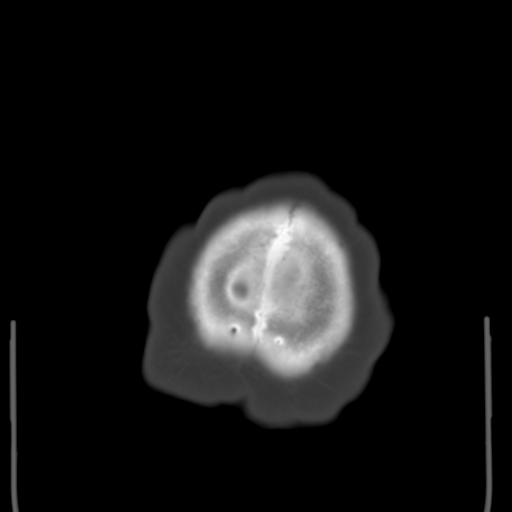

[Series 5: coronal soft tissue · coronal · 0.32mm/px · 3 of 72 slices shown]
[im 24/72  brain]
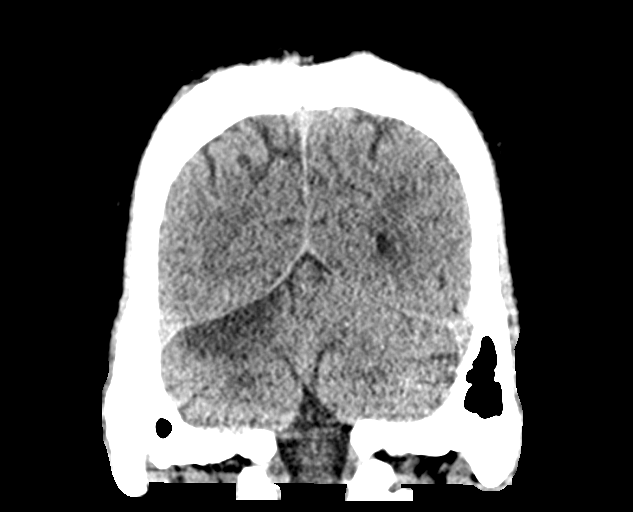
[im 32/72  brain]
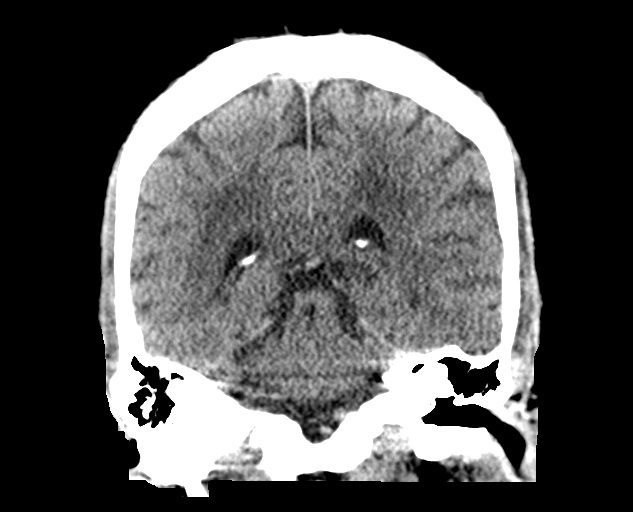
[im 40/72  brain]
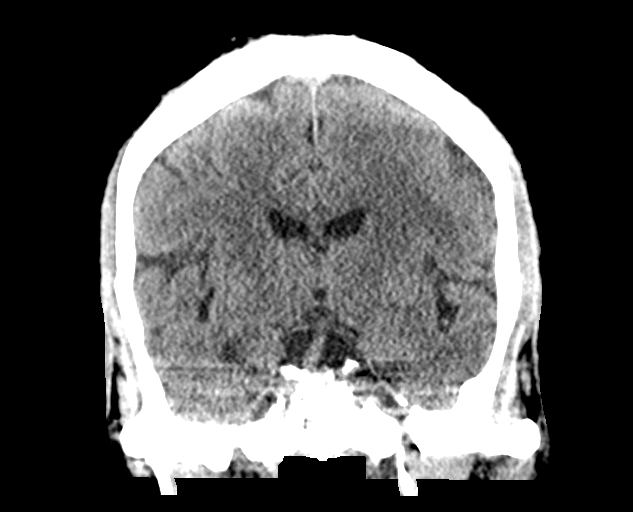

[Series 6: sagittal soft tissue · sagittal · 0.36mm/px · 3 of 57 slices shown]
[im 19/57  brain]
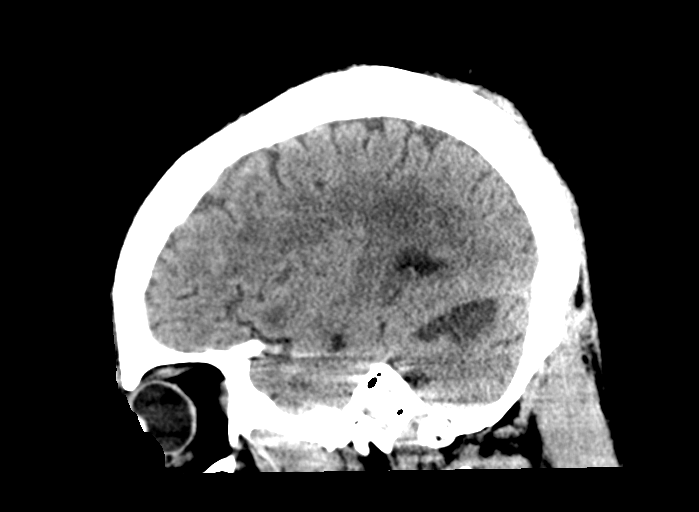
[im 29/57  brain]
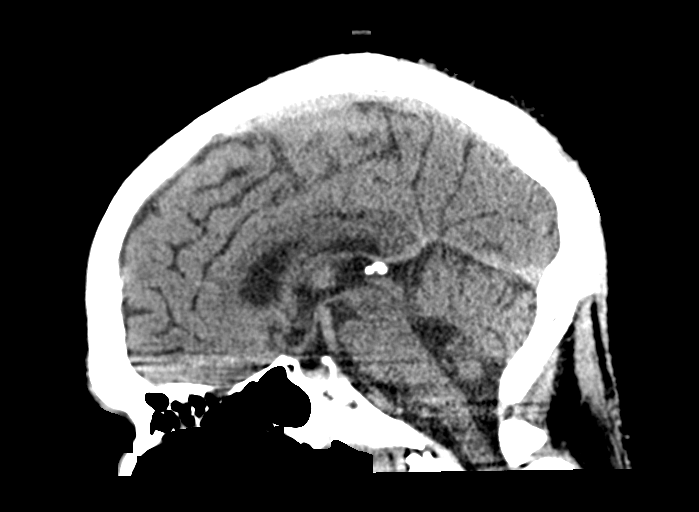
[im 38/57  brain]
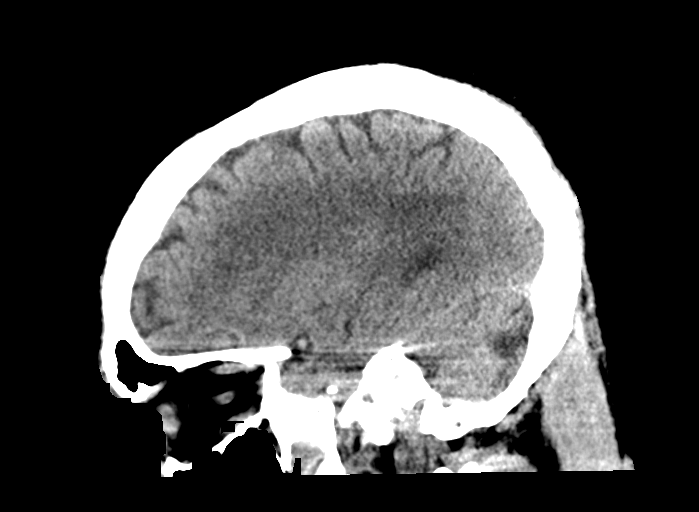

[15 of 47 positions shown; findings below may reference images not displayed]

FINDINGS: Brain: Continued evolving encephalomalacia of late subacute right
superior cerebellar infarct. Chronic inferior left cerebellar
infarct. No evidence of new/acute infarction. No evidence of
intracranial hemorrhage or extra-axial collection. No hydrocephalus.
Mild-moderate low-density changes within the periventricular and
subcortical white matter compatible with chronic microvascular
ischemic change. Mild diffuse cerebral volume loss.

Vascular: Atherosclerotic calcifications involving the large vessels
of the skull base. No unexpected hyperdense vessel.

Skull: Normal. Negative for fracture or focal lesion.

Sinuses/Orbits: No acute finding.

Other: None.
IMPRESSION: 1. Continued evolving encephalomalacia of late subacute right
superior cerebellar infarct. No new/acute intracranial findings.
2. Chronic inferior left cerebellar infarct.
3. Chronic microvascular ischemic change and cerebral volume loss.

## 2021-02-09 MED ORDER — LORAZEPAM 2 MG/ML IJ SOLN
INTRAMUSCULAR | Status: AC
  Filled 2021-02-09: qty 1

## 2021-02-09 MED ORDER — LORAZEPAM 1 MG PO TABS
2.0000 mg | ORAL_TABLET | Freq: Once | ORAL | Status: AC
  Administered 2021-02-09: 2 mg via ORAL

## 2021-02-09 MED ORDER — LORAZEPAM 1 MG PO TABS
2.0000 mg | ORAL_TABLET | Freq: Once | ORAL | Status: DC
  Filled 2021-02-09: qty 2

## 2021-02-09 MED ORDER — LORAZEPAM 2 MG/ML IJ SOLN
2.0000 mg | Freq: Once | INTRAMUSCULAR | Status: AC
  Administered 2021-02-09: 2 mg via INTRAMUSCULAR
  Filled 2021-02-09: qty 1

## 2021-02-09 MED ORDER — LORAZEPAM 2 MG/ML IJ SOLN: 2.0000 mg | Freq: Once | INTRAMUSCULAR | Status: DC

## 2021-02-09 NOTE — ED Notes (Signed)
Patient refused oral temperature check

## 2021-02-09 NOTE — ED Notes (Addendum)
Pt verbally aggressive and yelling profanity to pharmacy tech and RN. Pt states "I don't have to be nice. Fuck you." MD made aware.

## 2021-02-09 NOTE — ED Notes (Signed)
RN informed pt that he had medications prescribed to help with anxiety and agitation, pt began screaming at this RN, refusing PO meds.  Pt informed that meds had to be taken, pt again refused.  EDP Delford Field made aware.

## 2021-02-09 NOTE — ED Notes (Signed)
Spoke with patient about needing an EKG done. Pt did not respond to this Clinical research associate or acknowledge that he had been spoken to.

## 2021-02-09 NOTE — ED Notes (Addendum)
This RN attempted blood draw twice. Both attempts pt requested RN take the needle out as soon as the pt was stuck. Pt became agitated and began swinging his arms. Pt refused to let this RN attempt again. RN unable to obtain labs due to this. Phlebotomy consulted.

## 2021-02-09 NOTE — ED Notes (Signed)
Per radiology tech, pt refusing scans, cursing at staff, highly agitated.  This RN informed pt of necessity of radiology testing, pt began to escalate language, hostile body language. Security called to bedside. EDP Delford Field made aware.

## 2021-02-09 NOTE — ED Triage Notes (Signed)
Per EMS, from the motel, reports SI. C/o right leg pain since this morning.  Reports SI since this morning. States "I have a plan but I don't have it with me right now."

## 2021-02-09 NOTE — BH Assessment (Signed)
Attempted tele-assessment but Pt was immediately angry and belligerent. TTS attempted to de-escalate but remained angry and refused to answer questions. Pt called the RN and demanded the tele-cart be removed. TTS will try again when Pt is willing to participate.   Pamalee Leyden, [facility], [facility] Triage Specialist [phone]

## 2021-02-09 NOTE — ED Notes (Signed)
Security at bedside, along with off duty PD.  Pt reports that he will take PO meds at this time in security and PD's presence.  This RN gave PO meds to pt in security presence.

## 2021-02-09 NOTE — ED Notes (Signed)
Patient refuses vitals

## 2021-02-09 NOTE — ED Notes (Addendum)
Pt refusing CT scan and oral meds at this time. MD made aware.

## 2021-02-09 NOTE — ED Notes (Signed)
Pt agitated and yelling. Multiple staff members attempted to deescalate. Pt continues to scream profanity, and now refusing meds. Security at bedside. MD made aware.

## 2021-02-09 NOTE — ED Provider Notes (Signed)
[facility]-EMERGENCY DEPT Provider Note   CSN: [id number] Arrival date & time: 02/09/21  1422     History Chief Complaint  Patient presents with   Leg Pain   Suicidal    [name] is a 55 y.o. male.  Patient is 55 years old.  He has a history of schizoaffective disorder, substance use disorder, and has recently been admitted to the hospital for a CVA as well as bilateral great toe amputations.  He also has housing instability.  He states that he was kicked out of the place that he was staying.  Today he also noticed that he was unable to walk because of right leg weakness.  He states that his right arm has been tremulous as well.  Furthermore, he has been experiencing pain in his great toes where he had amputations.  Finally, he states that he is hearing auditory hallucinations which are telling him to kill himself.  He does have a history of overdosing.  The history is provided by the patient. History limited by: mental illness: pressured speech, tangential speech.  Weakness Severity:  Moderate Onset quality:  Sudden Duration:  9 hours Timing:  Constant Progression:  Unchanged Chronicity:  New Context comment:  Recent stroke which affected his left side Relieved by:  Nothing Worsened by:  Nothing Ineffective treatments:  None tried Associated symptoms: difficulty walking   Associated symptoms: no abdominal pain, no arthralgias, no chest pain, no cough, no dysuria, no numbness in extremities, no fever, no seizures, no shortness of breath, no vision change and no vomiting        Past Medical History:  Diagnosis Date   Asthma    Bipolar 1 disorder (HCC)    CHF (congestive heart failure) (HCC)    COPD (chronic obstructive pulmonary disease) (HCC)    Depression    Diabetes mellitus without complication (HCC)    GERD (gastroesophageal reflux disease)    Homelessness    Hypertension    QT prolongation    Schizoaffective disorder [facility])      Patient Active Problem List   Diagnosis Date Noted   Encounter for assessment of healthcare decision-making capacity    Pain, dental    Dystonia    Dysarthria    Shock liver 01/04/2021   Acute metabolic encephalopathy 01/04/2021   CAD (coronary artery disease) 01/04/2021   HTN (hypertension) 01/04/2021   Bipolar I disorder (HCC) 01/04/2021   Cocaine abuse (HCC) 01/04/2021   Homelessness 01/04/2021   HLD (hyperlipidemia) 01/04/2021   Osteomyelitis of great toe (HCC)    Ulcer of great toe, left, with necrosis of bone (HCC)    Ulcer of great toe, right, with necrosis of muscle (HCC)    Acute osteomyelitis of toe, left (HCC)    Acute on chronic combined systolic and diastolic CHF (congestive heart failure) (HCC) 12/20/2020   Respiratory failure (HCC)    Ischemic stroke (HCC) 12/17/2020   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   Diabetic foot ulcer (HCC) 12/08/2020   CHF (congestive heart failure) (HCC) 11/14/2020   CHF exacerbation (HCC) 11/13/2020   Hypokalemia    Hyperlipidemia    Acute respiratory failure with hypoxia (HCC) 10/26/2020   COPD with acute exacerbation (HCC) 10/14/2020   Acute on chronic combined systolic and diastolic CHF (congestive heart failure) (HCC) 10/02/2020   Near syncope 07/21/2020   Schizoaffective disorder (HCC) 06/24/2019   SOB (shortness of breath)    QT prolongation 05/25/2019   Cocaine abuse (HCC)    Elevated troponin  Homeless    Suicidal ideation    Bilateral foot pain 04/05/2019   Housing situation unstable 03/28/2019   Bilateral lower extremity edema    Asthma 12/26/2016   Tobacco use disorder 12/26/2016   Schizophrenia (HCC) 12/26/2016   Cocaine abuse with cocaine-induced mood disorder (HCC) 09/06/2016   Alcohol use disorder, mild, abuse 07/31/2016   Substance induced mood disorder (HCC) 07/30/2016   Schizoaffective disorder, bipolar type (HCC) 05/31/2016   Cocaine use disorder, severe,  dependence (HCC) 05/31/2016   Hypertension 05/31/2016    Past Surgical History:  Procedure Laterality Date   AMPUTATION TOE Bilateral 01/02/2021   Procedure: AMPUTATION TOE;  Surgeon: Park Liter, DPM;  Location: [facility] OR;  Service: Podiatry;  Laterality: Bilateral;  Ok to leave patient on hospital bed   IR ANGIO INTRA EXTRACRAN SEL COM CAROTID INNOMINATE BILAT MOD SED  12/17/2020   IR ANGIO VERTEBRAL SEL VERTEBRAL BILAT MOD SED  12/17/2020   RADIOLOGY WITH ANESTHESIA N/A 12/17/2020   Procedure: IR WITH ANESTHESIA;  Surgeon: Radiologist, Medication, MD;  Location: [facility] OR;  Service: Radiology;  Laterality: N/A;       Family History  Problem Relation Age of Onset   Drug abuse Mother    Suicidality Mother     Social History   Tobacco Use   Smoking status: Current Every Day Smoker    Years: 30.00    Types: Cigarettes   Smokeless tobacco: Never Used  Vaping Use   Vaping Use: Never used  Substance Use Topics   Alcohol use: Yes    Comment: infrequently   Drug use: Yes    Types: Cocaine, Marijuana, "Crack" cocaine    Home Medications Prior to Admission medications   Medication Sig Start Date End Date Taking? Authorizing Provider  albuterol (VENTOLIN HFA) 108 (90 Base) MCG/ACT inhaler Inhale 2 puffs into the lungs every 6 (six) hours as needed for wheezing or shortness of breath. 11/16/20   Pokhrel, Rebekah Chesterfield, MD  aspirin 81 MG chewable tablet Chew 1 tablet (81 mg total) by mouth daily. 02/03/21   Leatha Gilding, MD  atorvastatin (LIPITOR) 40 MG tablet Take 1 tablet (40 mg total) by mouth daily. 12/11/20   Jackelyn Poling, DO  atorvastatin (LIPITOR) 40 MG tablet Take 1 tablet (40 mg total) by mouth daily. 02/03/21   Leatha Gilding, MD  carvedilol (COREG) 3.125 MG tablet Take 1 tablet (3.125 mg total) by mouth 2 (two) times daily with a meal. 11/16/20   Pokhrel, Rebekah Chesterfield, MD  carvedilol (COREG) 3.125 MG tablet Take 1 tablet (3.125 mg total) by mouth 2 (two) times daily with a  meal. 02/03/21   Gherghe, Daylene Katayama, MD  cloNIDine (CATAPRES) 0.1 MG tablet Take 1 tablet (0.1 mg total) by mouth 3 (three) times daily. 11/16/20   Pokhrel, Rebekah Chesterfield, MD  clopidogrel (PLAVIX) 75 MG tablet Take 1 tablet (75 mg total) by mouth daily. 11/16/20   Pokhrel, Rebekah Chesterfield, MD  divalproex (DEPAKOTE ER) 500 MG 24 hr tablet Take 1 tablet (500 mg total) by mouth in the morning and at bedtime. 11/16/20   Pokhrel, Rebekah Chesterfield, MD  divalproex (DEPAKOTE ER) 500 MG 24 hr tablet Take 1 tablet (500 mg total) by mouth in the morning and at bedtime. 02/03/21   Leatha Gilding, MD  doxycycline (VIBRA-TABS) 100 MG tablet Take 1 tablet (100 mg total) by mouth every 12 (twelve) hours. 12/10/20   Jackelyn Poling, DO  furosemide (LASIX) 40 MG tablet Take 1 tablet (40 mg total) by mouth daily.  11/16/20   Pokhrel, Rebekah Chesterfield, MD  furosemide (LASIX) 40 MG tablet Take 1 tablet (40 mg total) by mouth daily. 02/03/21   Leatha Gilding, MD  gabapentin (NEURONTIN) 100 MG capsule Take 2 capsules (200 mg total) by mouth 2 (two) times daily. 11/16/20   Pokhrel, Rebekah Chesterfield, MD  gabapentin (NEURONTIN) 300 MG capsule Take 2 capsules (600 mg total) by mouth 3 (three) times daily. 02/03/21   Leatha Gilding, MD  hydrALAZINE (APRESOLINE) 50 MG tablet Take 1 tablet (50 mg total) by mouth every 8 (eight) hours. 11/16/20   Pokhrel, Rebekah Chesterfield, MD  isosorbide dinitrate (ISORDIL) 5 MG tablet Take 1 tablet (5 mg total) by mouth 3 (three) times daily. 02/03/21   Leatha Gilding, MD  isosorbide mononitrate (IMDUR) 30 MG 24 hr tablet Take 1 tablet (30 mg total) by mouth daily. 11/16/20 12/16/20  Pokhrel, Rebekah Chesterfield, MD  levETIRAcetam (KEPPRA) 500 MG tablet Take 1 tablet (500 mg total) by mouth 2 (two) times daily. 02/03/21   Leatha Gilding, MD  lisinopril (ZESTRIL) 20 MG tablet Take 1 tablet (20 mg total) by mouth daily. 12/10/20   Jackelyn Poling, DO  magnesium oxide (MAG-OX) 400 (241.3 Mg) MG tablet Take 1 tablet (400 mg total) by mouth 2 (two) times daily. 02/03/21    Leatha Gilding, MD  neomycin-bacitracin-polymyxin (NEOSPORIN) ointment Apply 1 application topically 2 (two) times daily. 11/16/20   Pokhrel, Rebekah Chesterfield, MD  predniSONE (DELTASONE) 10 MG tablet Take 1 tablet (10 mg total) by mouth daily with breakfast. 12/11/20   Atha Starks, Ryan, DO  sacubitril-valsartan (ENTRESTO) 24-26 MG Take 1 tablet by mouth 2 (two) times daily. 02/03/21   Leatha Gilding, MD  senna-docusate (SENOKOT-S) 8.6-50 MG tablet Take 1 tablet by mouth at bedtime as needed for mild constipation. 10/07/20   Marguerita Merles Latif, DO  spironolactone (ALDACTONE) 25 MG tablet Take 0.5 tablets (12.5 mg total) by mouth daily. 11/16/20 12/16/20  Pokhrel, Rebekah Chesterfield, MD  spironolactone (ALDACTONE) 25 MG tablet Take 0.5 tablets (12.5 mg total) by mouth daily. 01/15/21   Russella Dar, NP  traZODone (DESYREL) 50 MG tablet Take 1 tablet (50 mg total) by mouth at bedtime. 11/16/20   Pokhrel, Rebekah Chesterfield, MD  umeclidinium bromide (INCRUSE ELLIPTA) 62.5 MCG/INH AEPB Inhale 1 puff into the lungs daily. 11/16/20   Pokhrel, Rebekah Chesterfield, MD    Allergies    Ibuprofen, Ibuprofen, Acetaminophen, Latex, Lyrica cr [pregabalin er], Peanut-containing drug products, and Tylenol [acetaminophen]  Review of Systems   Review of Systems  Constitutional: Negative for chills and fever.  HENT: Negative for ear pain and sore throat.   Eyes: Negative for pain and visual disturbance.  Respiratory: Negative for cough and shortness of breath.   Cardiovascular: Negative for chest pain and palpitations.  Gastrointestinal: Negative for abdominal pain and vomiting.  Genitourinary: Negative for dysuria and hematuria.  Musculoskeletal: Negative for arthralgias and back pain.       Pain in both great toes  Skin: Negative for color change and rash.  Neurological: Positive for weakness. Negative for seizures and syncope.  Psychiatric/Behavioral: Positive for dysphoric mood and suicidal ideas.  All other systems reviewed and are  negative.   Physical Exam Updated Vital Signs BP (!) 138/102    Pulse 97    Temp 97.8 F (36.6 C)    Resp 20    SpO2 100%   Physical Exam Constitutional:      Appearance: Normal appearance.  HENT:     Head: Normocephalic and atraumatic.  Nose: Nose normal.  Eyes:     Conjunctiva/sclera: Conjunctivae normal.  Pulmonary:     Effort: No respiratory distress.  Musculoskeletal:     Cervical back: Normal range of motion.     Comments: Incisions on great toe are clean, dry, and intact without overt drainage or fluctuance.  Skin:    General: Skin is warm and dry.  Neurological:     Mental Status: He is alert.     Cranial Nerves: No cranial nerve deficit.     Sensory: No sensory deficit.     Comments: Unable to use right leg  Psychiatric:     Comments: The patient is very agitated.  He is cooperative, but he has tangential thoughts as well as flight of ideas.  He is voicing active auditory hallucinations telling him to kill himself.     ED Results / Procedures / Treatments   Labs (all labs ordered are listed, but only abnormal results are displayed) Labs Reviewed  ACETAMINOPHEN LEVEL - Abnormal; Notable for the following components:      Result Value   Acetaminophen (Tylenol), Serum <10 (*)    All other components within normal limits  COMPREHENSIVE METABOLIC PANEL - Abnormal; Notable for the following components:   CO2 21 (*)    Glucose, Bld 150 (*)    All other components within normal limits  SALICYLATE LEVEL - Abnormal; Notable for the following components:   Salicylate Lvl <7.0 (*)    All other components within normal limits  CBC WITH DIFFERENTIAL/PLATELET - Abnormal; Notable for the following components:   RDW 15.9 (*)    All other components within normal limits  URINALYSIS, ROUTINE W REFLEX MICROSCOPIC - Abnormal; Notable for the following components:   Color, Urine AMBER (*)    Ketones, ur 5 (*)    Protein, ur 30 (*)    All other components within normal  limits  RAPID URINE DRUG SCREEN, HOSP PERFORMED - Abnormal; Notable for the following components:   Cocaine POSITIVE (*)    All other components within normal limits  VALPROIC ACID LEVEL - Abnormal; Notable for the following components:   Valproic Acid Lvl <10 (*)    All other components within normal limits  RESP PANEL BY RT-PCR (FLU A&B, COVID) ARPGX2  ETHANOL    EKG None  Radiology CT Head Wo Contrast  Result Date: 02/09/2021 CLINICAL DATA:  Stroke follow-up EXAM: CT HEAD WITHOUT CONTRAST TECHNIQUE: Contiguous axial images were obtained from the base of the skull through the vertex without intravenous contrast. COMPARISON:  CT 12/26/2020.  MRI 01/08/2021 FINDINGS: Brain: Continued evolving encephalomalacia of late subacute right superior cerebellar infarct. Chronic inferior left cerebellar infarct. No evidence of new/acute infarction. No evidence of intracranial hemorrhage or extra-axial collection. No hydrocephalus. Mild-moderate low-density changes within the periventricular and subcortical white matter compatible with chronic microvascular ischemic change. Mild diffuse cerebral volume loss. Vascular: Atherosclerotic calcifications involving the large vessels of the skull base. No unexpected hyperdense vessel. Skull: Normal. Negative for fracture or focal lesion. Sinuses/Orbits: No acute finding. Other: None. IMPRESSION: 1. Continued evolving encephalomalacia of late subacute right superior cerebellar infarct. No new/acute intracranial findings. 2. Chronic inferior left cerebellar infarct. 3. Chronic microvascular ischemic change and cerebral volume loss. Electronically Signed   By: Duanne GuessNicholas  Plundo D.O.   On: 02/09/2021 16:50    Procedures Procedures   Medications Ordered in ED Medications - No data to display  ED Course  I have reviewed the triage vital signs and the nursing  notes.  Pertinent labs & imaging results that were available during my care of the patient were reviewed by  me and considered in my medical decision making (see chart for details).  Clinical Course as of 02/09/21 2212  Sat Feb 09, 2021  1515 I was called from CT scan, and the patient is cursing out techs.  He is unable to be evaluated with CT scan secondary to his behavior.  I will write Ativan. [AW]  2139 Patient was uncooperative with initial [facility] evaluation. I spoke to him and he began cursing at me and asking to see his "papers." [AW]    Clinical Course User Index [AW] Koleen Distance, MD   MDM Rules/Calculators/A&P                          The patient is 55 years old.  He suffers from schizoaffective disorder and has a strong history of substance use disorder.  He has recently been hospitalized for a stroke.  He presents complaining of some weakness of his right leg.  He was evaluated for acute pathology, and CT scan was within normal limits.  He did voice suicidal ideations which were related to auditory hallucinations.  IVC papers were completed, and once he was medically stable, I did refer him to TTS.  He was seen and assessed and deemed suitable for discharge home.  The patient likely is suffering from a a cocaine induced mood disorder.  Final Clinical Impression(s) / ED Diagnoses Final diagnoses:  Suicidal ideation  Housing instability    Rx / DC Orders ED Discharge Orders    None       Koleen Distance, MD 02/10/21 1316

## 2021-02-09 NOTE — ED Notes (Addendum)
Patient refuses covid test. When RN attempted to get vitals the patient became verbally aggressive and removed equipment before values could be obtained.

## 2021-02-09 NOTE — ED Notes (Signed)
Pt verbally aggressive with TTS. Pt requested TTS cart be removed so I removed it and RN moved pt back to hallway.

## 2021-02-09 NOTE — ED Notes (Signed)
Patient transported to CT 

## 2021-02-10 ENCOUNTER — Emergency Department (EMERGENCY_DEPARTMENT_HOSPITAL)
Admission: EM | Admit: 2021-02-10 | Discharge: 2021-02-13 | Disposition: A | Discharge status: Home / Self Care | Payer: Medicaid Other | Source: Home / Self Care | Attending: Emergency Medicine | Admitting: Emergency Medicine

## 2021-02-10 ENCOUNTER — Ambulatory Visit (HOSPITAL_COMMUNITY): Admission: EM | Admit: 2021-02-10 | Discharge: 2021-02-10 | Payer: Medicaid Other | Source: Home / Self Care

## 2021-02-10 ENCOUNTER — Encounter (HOSPITAL_COMMUNITY): Payer: Self-pay | Admitting: Emergency Medicine

## 2021-02-10 ENCOUNTER — Other Ambulatory Visit: Payer: Self-pay

## 2021-02-10 DIAGNOSIS — J441 Chronic obstructive pulmonary disease with (acute) exacerbation: Secondary | ICD-10-CM | POA: Insufficient documentation

## 2021-02-10 DIAGNOSIS — I2510 Atherosclerotic heart disease of native coronary artery without angina pectoris: Secondary | ICD-10-CM | POA: Insufficient documentation

## 2021-02-10 DIAGNOSIS — E119 Type 2 diabetes mellitus without complications: Secondary | ICD-10-CM | POA: Insufficient documentation

## 2021-02-10 DIAGNOSIS — Z7982 Long term (current) use of aspirin: Secondary | ICD-10-CM | POA: Insufficient documentation

## 2021-02-10 DIAGNOSIS — Z7902 Long term (current) use of antithrombotics/antiplatelets: Secondary | ICD-10-CM | POA: Insufficient documentation

## 2021-02-10 DIAGNOSIS — F250 Schizoaffective disorder, bipolar type: Secondary | ICD-10-CM | POA: Diagnosis present

## 2021-02-10 DIAGNOSIS — J45909 Unspecified asthma, uncomplicated: Secondary | ICD-10-CM | POA: Insufficient documentation

## 2021-02-10 DIAGNOSIS — R45851 Suicidal ideations: Secondary | ICD-10-CM

## 2021-02-10 DIAGNOSIS — Z91040 Latex allergy status: Secondary | ICD-10-CM | POA: Insufficient documentation

## 2021-02-10 DIAGNOSIS — Z5900 Homelessness unspecified: Secondary | ICD-10-CM

## 2021-02-10 DIAGNOSIS — F17210 Nicotine dependence, cigarettes, uncomplicated: Secondary | ICD-10-CM | POA: Insufficient documentation

## 2021-02-10 DIAGNOSIS — R4587 Impulsiveness: Secondary | ICD-10-CM | POA: Insufficient documentation

## 2021-02-10 DIAGNOSIS — Z9889 Other specified postprocedural states: Secondary | ICD-10-CM

## 2021-02-10 DIAGNOSIS — R454 Irritability and anger: Secondary | ICD-10-CM | POA: Insufficient documentation

## 2021-02-10 DIAGNOSIS — F1414 Cocaine abuse with cocaine-induced mood disorder: Secondary | ICD-10-CM

## 2021-02-10 DIAGNOSIS — Z79899 Other long term (current) drug therapy: Secondary | ICD-10-CM | POA: Insufficient documentation

## 2021-02-10 DIAGNOSIS — I5043 Acute on chronic combined systolic (congestive) and diastolic (congestive) heart failure: Secondary | ICD-10-CM | POA: Insufficient documentation

## 2021-02-10 DIAGNOSIS — F1994 Other psychoactive substance use, unspecified with psychoactive substance-induced mood disorder: Secondary | ICD-10-CM

## 2021-02-10 DIAGNOSIS — I429 Cardiomyopathy, unspecified: Secondary | ICD-10-CM

## 2021-02-10 DIAGNOSIS — R4689 Other symptoms and signs involving appearance and behavior: Secondary | ICD-10-CM

## 2021-02-10 DIAGNOSIS — I110 Hypertensive heart disease with heart failure: Secondary | ICD-10-CM | POA: Insufficient documentation

## 2021-02-10 DIAGNOSIS — F1410 Cocaine abuse, uncomplicated: Secondary | ICD-10-CM

## 2021-02-10 LAB — COMPREHENSIVE METABOLIC PANEL
ALT: 20 U/L (ref 0–44)
AST: 33 U/L (ref 15–41)
Albumin: 3.7 g/dL (ref 3.5–5.0)
Alkaline Phosphatase: 52 U/L (ref 38–126)
Anion gap: 11 (ref 5–15)
BUN: 13 mg/dL (ref 6–20)
CO2: 20 mmol/L — ABNORMAL LOW (ref 22–32)
Calcium: 9.3 mg/dL (ref 8.9–10.3)
Chloride: 106 mmol/L (ref 98–111)
Creatinine, Ser: 1.17 mg/dL (ref 0.61–1.24)
GFR, Estimated: >60 mL/min (ref >60)
Glucose, Bld: 78 mg/dL (ref 70–99)
Potassium: 4.4 mmol/L (ref 3.5–5.1)
Sodium: 137 mmol/L (ref 135–145)
Total Bilirubin: 1.2 mg/dL (ref 0.3–1.2)
Total Protein: 7.4 g/dL (ref 6.5–8.1)

## 2021-02-10 LAB — CBC
HCT: 44.1 % (ref 39.0–52.0)
Hemoglobin: 14.5 g/dL (ref 13.0–17.0)
MCH: 31.8 pg (ref 26.0–34.0)
MCHC: 32.9 g/dL (ref 30.0–36.0)
MCV: 96.7 fL (ref 80.0–100.0)
Platelets: 203 10*3/uL (ref 150–400)
RBC: 4.56 MIL/uL (ref 4.22–5.81)
RDW: 16.1 % — ABNORMAL HIGH (ref 11.5–15.5)
WBC: 6.4 10*3/uL (ref 4.0–10.5)
nRBC: 0 % (ref 0.0–0.2)

## 2021-02-10 LAB — ETHANOL: Alcohol, Ethyl (B): <10 mg/dL (ref <10)

## 2021-02-10 LAB — SALICYLATE LEVEL: Salicylate Lvl: <7 mg/dL — ABNORMAL LOW (ref 7.0–30.0)

## 2021-02-10 LAB — ACETAMINOPHEN LEVEL: Acetaminophen (Tylenol), Serum: <10 ug/mL — ABNORMAL LOW (ref 10–30)

## 2021-02-10 NOTE — Consult Note (Signed)
[facility] Psych ED Discharge  02/10/2021 12:27 PM [name]  MRN:  [id number] Principal Problem: Cocaine abuse with cocaine-induced mood disorder [facility]) Discharge Diagnoses: Principal Problem:   Cocaine abuse with cocaine-induced mood disorder (HCC)  Subjective: Reports needing a chair to go to the bathroom  02/10/21: This provider attempted to evaluate patient, however patient refused to wake up and speak with his psychiatric team. Patient opened his eye, but closed it when he looked to see who was speaking to him. This provider tried multiple attempts to speak to patient and patient consistently refused. Later, he was sitting on the side of his stretcher eating heartily.  No signs of distress, denied suicidal/homicidal ideations gruffly, not responding to internal stimuli.  He was using cocaine prior to admission and irritable most of the time in the ED when he was not sleeping with frequent demands for food and other things.     02/10/21 per [facility] assessment: TTS Attempt: TTS made second attempt to assess pt this morning. Pt was still very agitated and belligerent with this Clinical research associate. He continues to endorse suicidal ideations. When this writer attempted to assess plan and means, pt states "I'm not answering that dumbass question". Pt then refused to continue assessment. Tele-psych call ended.  Total Time spent with patient: 30 minutes  Past Psychiatric History: See Below  Past Medical History:  Past Medical History:  Diagnosis Date  . Asthma   . Bipolar 1 disorder (HCC)   . CHF (congestive heart failure) (HCC)   . COPD (chronic obstructive pulmonary disease) (HCC)   . Depression   . Diabetes mellitus without complication (HCC)   . GERD (gastroesophageal reflux disease)   . Homelessness   . Hypertension   . QT prolongation   . Schizoaffective disorder [facility])     Past Surgical History:  Procedure Laterality Date  . AMPUTATION TOE Bilateral 01/02/2021   Procedure: AMPUTATION TOE;  Surgeon:  Park Liter, DPM;  Location: [facility] OR;  Service: Podiatry;  Laterality: Bilateral;  Ok to leave patient on hospital bed  . IR ANGIO INTRA EXTRACRAN SEL COM CAROTID INNOMINATE BILAT MOD SED  12/17/2020  . IR ANGIO VERTEBRAL SEL VERTEBRAL BILAT MOD SED  12/17/2020  . RADIOLOGY WITH ANESTHESIA N/A 12/17/2020   Procedure: IR WITH ANESTHESIA;  Surgeon: Radiologist, Medication, MD;  Location: [facility] OR;  Service: Radiology;  Laterality: N/A;   Family History:  Family History  Problem Relation Age of Onset  . Drug abuse Mother   . Suicidality Mother    Family Psychiatric  History: See Above Social History:  Social History   Substance and Sexual Activity  Alcohol Use Yes   Comment: infrequently     Social History   Substance and Sexual Activity  Drug Use Yes  . Types: Cocaine, Marijuana, "Crack" cocaine    Social History   Socioeconomic History  . Marital status: Single    Spouse name: Not on file  . Number of children: 4  . Years of education: Not on file  . Highest education level: Not on file  Occupational History  . Occupation: N/A  Tobacco Use  . Smoking status: Current Every Day Smoker    Years: 30.00    Types: Cigarettes  . Smokeless tobacco: Never Used  Vaping Use  . Vaping Use: Never used  Substance and Sexual Activity  . Alcohol use: Yes    Comment: infrequently  . Drug use: Yes    Types: Cocaine, Marijuana, "Crack" cocaine  . Sexual activity: Not  Currently    Partners: Female    Birth control/protection: None  Other Topics Concern  . Not on file  Social History Narrative   ** Merged History Encounter **       Social Determinants of Health   Financial Resource Strain: Not on file  Food Insecurity: Not on file  Transportation Needs: Not on file  Physical Activity: Not on file  Stress: Not on file  Social Connections: Not on file    Has this patient used any form of tobacco in the last 30 days? (Cigarettes, Smokeless Tobacco, Cigars, and/or Pipes) A  prescription for an FDA-approved tobacco cessation medication was offered at discharge and the patient refused  Current Medications: No current facility-administered medications for this encounter.   Current Outpatient Medications  Medication Sig Dispense Refill  . albuterol (VENTOLIN HFA) 108 (90 Base) MCG/ACT inhaler Inhale 2 puffs into the lungs every 6 (six) hours as needed for wheezing or shortness of breath. 8 g 2  . aspirin 81 MG chewable tablet Chew 1 tablet (81 mg total) by mouth daily. 30 tablet 0  . atorvastatin (LIPITOR) 40 MG tablet Take 1 tablet (40 mg total) by mouth daily. 30 tablet 0  . atorvastatin (LIPITOR) 40 MG tablet Take 1 tablet (40 mg total) by mouth daily. 30 tablet 0  . carvedilol (COREG) 3.125 MG tablet Take 1 tablet (3.125 mg total) by mouth 2 (two) times daily with a meal. 60 tablet 0  . carvedilol (COREG) 3.125 MG tablet Take 1 tablet (3.125 mg total) by mouth 2 (two) times daily with a meal. 60 tablet 0  . cloNIDine (CATAPRES) 0.1 MG tablet Take 1 tablet (0.1 mg total) by mouth 3 (three) times daily. 90 tablet 0  . clopidogrel (PLAVIX) 75 MG tablet Take 1 tablet (75 mg total) by mouth daily. 30 tablet 2  . divalproex (DEPAKOTE ER) 500 MG 24 hr tablet Take 1 tablet (500 mg total) by mouth in the morning and at bedtime. 60 tablet 2  . divalproex (DEPAKOTE ER) 500 MG 24 hr tablet Take 1 tablet (500 mg total) by mouth in the morning and at bedtime. 60 tablet 0  . doxycycline (VIBRA-TABS) 100 MG tablet Take 1 tablet (100 mg total) by mouth every 12 (twelve) hours. 11 tablet 0  . furosemide (LASIX) 40 MG tablet Take 1 tablet (40 mg total) by mouth daily. 30 tablet 2  . furosemide (LASIX) 40 MG tablet Take 1 tablet (40 mg total) by mouth daily. 30 tablet 0  . gabapentin (NEURONTIN) 100 MG capsule Take 2 capsules (200 mg total) by mouth 2 (two) times daily. 60 capsule 2  . gabapentin (NEURONTIN) 300 MG capsule Take 2 capsules (600 mg total) by mouth 3 (three) times daily.  90 capsule 0  . hydrALAZINE (APRESOLINE) 50 MG tablet Take 1 tablet (50 mg total) by mouth every 8 (eight) hours. 90 tablet 2  . isosorbide dinitrate (ISORDIL) 5 MG tablet Take 1 tablet (5 mg total) by mouth 3 (three) times daily. 90 tablet 0  . isosorbide mononitrate (IMDUR) 30 MG 24 hr tablet Take 1 tablet (30 mg total) by mouth daily. 30 tablet 2  . levETIRAcetam (KEPPRA) 500 MG tablet Take 1 tablet (500 mg total) by mouth 2 (two) times daily. 60 tablet 0  . lisinopril (ZESTRIL) 20 MG tablet Take 1 tablet (20 mg total) by mouth daily. 30 tablet 0  . magnesium oxide (MAG-OX) 400 (241.3 Mg) MG tablet Take 1 tablet (400 mg total)  by mouth 2 (two) times daily. 60 tablet 0  . neomycin-bacitracin-polymyxin (NEOSPORIN) ointment Apply 1 application topically 2 (two) times daily. 60 g 0  . predniSONE (DELTASONE) 10 MG tablet Take 1 tablet (10 mg total) by mouth daily with breakfast. 3 tablet 0  . sacubitril-valsartan (ENTRESTO) 24-26 MG Take 1 tablet by mouth 2 (two) times daily. 60 tablet 0  . senna-docusate (SENOKOT-S) 8.6-50 MG tablet Take 1 tablet by mouth at bedtime as needed for mild constipation. 30 tablet 0  . spironolactone (ALDACTONE) 25 MG tablet Take 0.5 tablets (12.5 mg total) by mouth daily. 15 tablet 2  . spironolactone (ALDACTONE) 25 MG tablet Take 0.5 tablets (12.5 mg total) by mouth daily. 30 tablet 6  . traZODone (DESYREL) 50 MG tablet Take 1 tablet (50 mg total) by mouth at bedtime. 30 tablet 2  . umeclidinium bromide (INCRUSE ELLIPTA) 62.5 MCG/INH AEPB Inhale 1 puff into the lungs daily. 30 each 2   PTA Medications: (Not in a hospital admission)   Musculoskeletal: Strength & Muscle Tone: UTA  Gait & Station: UTA Patient leans: N/A  Psychiatric Specialty Exam:  Physical Exam: Physical Exam Vitals and nursing note reviewed.  Constitutional:      Appearance: Normal appearance. He is normal weight.  HENT:     Head: Normocephalic.     Nose: Nose normal.  Pulmonary:      Effort: Pulmonary effort is normal.  Musculoskeletal:     Cervical back: Normal range of motion.  Neurological:     Mental Status: Mental status is at baseline.  Psychiatric:        Attention and Perception: Attention and perception normal.        Mood and Affect: Affect is blunt.        Speech: Speech normal.        Behavior: Behavior normal. Behavior is cooperative.        Thought Content: Thought content normal.        Cognition and Memory: Cognition and memory normal.        Judgment: Judgment normal.     Comments: Patient refused to speak with team and refused assessment    Review of Systems  Unable to perform ROS: Patient unresponsive  Psychiatric/Behavioral: Positive for substance abuse.  All other systems reviewed and are negative.  Blood pressure 138/79, pulse 74, temperature 98 F (36.7 C), temperature source Oral, resp. rate 14, SpO2 98 %. There is no height or weight on file to calculate BMI.   Demographic Factors:  Male  Loss Factors: NA  Historical Factors: Family history of mental illness or substance abuse  Risk Reduction Factors:   NA  Continued Clinical Symptoms:  Alcohol/Substance Abuse/Dependencies  Cognitive Features That Contribute To Risk:  Closed-mindedness    Suicide Risk:  Minimal: No identifiable suicidal ideation.  Patients presenting with no risk factors but with morbid ruminations; may be classified as minimal risk based on the severity of the depressive symptoms    Plan Of Care/Follow-up recommendations:  Activity:  As Tolerated Diet:  Heart Healthy  Disposition:  Cocaine abuse with cocaine-induced mood disorder -Resources in the community for drug abuse, ADS and [facility] information  Nanine Means, NP 02/10/2021, 12:27 PM

## 2021-02-10 NOTE — ED Notes (Signed)
Attempted to collect vital signs, pt refused, RN made aware.

## 2021-02-10 NOTE — ED Notes (Signed)
Patient provided graham crackers and milk per request after finishing breakfast tray. Patient is talking to self in hallway. Unable to understand due to mumbling.

## 2021-02-10 NOTE — ED Notes (Signed)
Breakfast tray provided. Patient asked to speak to charge nurse. Asked patient what they would like to speak to her about and he said just get her. Will notify charge

## 2021-02-10 NOTE — ED Notes (Signed)
Ordered a tray for PT .
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
## 2021-02-10 NOTE — ED Notes (Signed)
Patient requesting chair to use restroom, asked patient if he could use wheelchair beside him patient starts raising voice saying all I want is a chair, I explained lets use the wheelchair you have here beside you. Patient starts yelling that chair isn't mine, I asked whose is it and then he proceeds to state his and he isn't using it. Patient states forget it and refuses to talk to me.

## 2021-02-10 NOTE — ED Notes (Signed)
Patient requesting, [name] crackers, peanut butter, and milk. Explained to patient his breakfast will be here soon. Patient understands.
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
## 2021-02-10 NOTE — ED Notes (Signed)
Pt requested to speak to the charge nurse. Pt requested a copy of his IVC papers which was provided and to use the phone. Pt was advised that if he would cooperate and do complete his assessment w/o being belligerent and cooperative he could use the phone. Pt became irate and started cursing stating "just give me my fuckin medicine and let me go back to sleep". Asher Muir, NP advised that pt is being d/c home after resending his IVCs and not to try to discuss anything with him since he is cursing with threatening actions toward staff.

## 2021-02-10 NOTE — ED Notes (Signed)
Went to obtain vital signs. Patient refused.

## 2021-02-10 NOTE — ED Notes (Signed)
Patient flagging down staff in hallway asking am I going get to talk to your or not. Staff explained to patient that he has to finish his task back patients says ok.

## 2021-02-10 NOTE — ED Notes (Signed)
Pt uncooperative in allowing this EMT to get a full set of vitals. RN aware.

## 2021-02-10 NOTE — ED Notes (Signed)
Pt placed in room 25 for TTS consult

## 2021-02-10 NOTE — Discharge Planning (Signed)
RNCM contacted to assist with PCP establishment and medication assistance.  Pt has [employer] with an assigned PCP Retail buyer) and $3.oo Rx co-pay.  Pt must contact DSS to have PCP changed.    Ronny Korff J. Lucretia Roers, RN, BSN, [place] [phone]

## 2021-02-10 NOTE — ED Provider Notes (Signed)
Emergency Medicine Observation Re-evaluation Note  [name] is a 55 y.o. male, seen on rounds today.  Pt initially presented to the ED for complaints of Leg Pain and Suicidal Currently, the patient is awaiting TTS evaluation  Physical Exam  BP 138/79 (BP Location: Left Arm)   Pulse 74   Temp 98 F (36.7 C) (Oral)   Resp 14   SpO2 98%  Physical Exam General: Resting.  Cardiac: Well perfused.  Lungs: Even, unlabored respirations.  Psych: Calm but intermittently agitated overnight.   ED Course / MDM  EKG:EKG Interpretation  Date/Time:  Saturday February 09 2021 19:48:52 EST Ventricular Rate:  85 PR Interval:  176 QRS Duration: 92 QT Interval:  442 QTC Calculation: 525 R Axis:   52 Text Interpretation: Normal sinus rhythm Biatrial enlargement Left ventricular hypertrophy ( Sokolow-Lyon , Cornell product , Romhilt-Estes ) ST-t wave abnormality Abnormal ECG Confirmed by Gerhard Munch [id number]) on 02/09/2021 8:17:10 PM  Clinical Course as of 02/10/21 0827  Sat Feb 09, 2021  1515 I was called from CT scan, and the patient is cursing out techs.  He is unable to be evaluated with CT scan secondary to his behavior.  I will write Ativan. [AW]  2139 Patient was uncooperative with initial [facility] evaluation. I spoke to him and he began cursing at me and asking to see his "papers." [AW]    Clinical Course User Index [AW] Koleen Distance, MD   I have reviewed the labs performed to date as well as medications administered while in observation.  Recent changes in the last 24 hours include attempted TTS evaluation but patient agitated and not willing to participate. Will try again this AM.   Patient evaluated by TTS. Patient deemed safe for discharge. Cooperative here. Denies SI. Plan for discharge. IVC reversed by TTS.   Plan  Current plan is for discharge per TTS evaluation.     Maia Plan, MD 02/10/21 1714

## 2021-02-10 NOTE — BH Assessment (Signed)
TTS Attempt: TTS made second attempt to assess pt this morning. Pt was still very agitated and belligerent with this Clinical research associate. He continues to endorse suicidal ideations. When this writer attempted to assess plan and means, pt states "I'm not answering that dumbass question". Pt then refused to continue assessment. Tele-psych call ended.

## 2021-02-10 NOTE — ED Notes (Signed)
This RN assisted pt from wheelchair to stretcher and while doing so pt began to get loud and using profanity. This RN asked the pt to please lower voice and not talk that way. Pt began to yell at this RN stating " Don't tell me what the fuck to do I can yell and say whatever the fuck I want get out my fucking face" Pt continues to yell in the hallway and and using profanity towards staff. [facility] Security has been notified.

## 2021-02-10 NOTE — Discharge Instructions (Signed)
[facility]  Rehabilitation center in [place], [place] Address: [address] [place], [name] [address] Hours:  Closed ? Opens 6:30AM Mon Updated by others 6 weeks ago Phone: [phone]  [facility]  Mental health clinic [place] Address: [address] [place], [name] [address] Hours:  Open 24 hours Phone: [phone]

## 2021-02-11 LAB — RAPID URINE DRUG SCREEN, HOSP PERFORMED
Amphetamines: NOT DETECTED
Barbiturates: NOT DETECTED
Benzodiazepines: POSITIVE — AB
Cocaine: POSITIVE — AB
Opiates: NOT DETECTED
Tetrahydrocannabinol: NOT DETECTED

## 2021-02-11 NOTE — ED Notes (Signed)
Patient has refused all care from staff; Pt has wheelchair at bedside to go to the bathroom but refuses to use it; pt demands urinal but staff does not feel safe allowing patient urinal; pt advised staff can assist him in getting into wheelchair but patient refused and continues to yell at staff and refuse care-Monique,RN

## 2021-02-11 NOTE — Progress Notes (Signed)
CSW spoke with Vernell Morgans of [place] [facility] who states that when the patient was discharged from [facility] on 02/03/21 [place] so the APS case was closed.  CSW met with patient at bedside as he was recently a DTP patient. Patient states he is currently suicidal - CSW encouraged patient to participate in TTS assessment. Patient requesting admission to [facility] for treatment. Patient states he has not been taking his prescribed medications. Patient requested bus passes - CSW denied request as he had just stated he was going to harm himself.   Patient is currently under IVC.   Madilyn Fireman, MSW, [name] Transitions of Care  Clinical Social Worker II [phone]

## 2021-02-11 NOTE — BH Assessment (Signed)
Clinician attempted to complete pt's mental health assessment by calling the Tele-Assessment machine but there was no answer at either 0126 or at 0132. TTS will attempt assessment at a later time.

## 2021-02-11 NOTE — Progress Notes (Signed)
Per [facility], facility is at capacity due to staffing shortages. CSW will refer patient out at this time.   Pt. meets criteria for inpatient treatment per Fransisca Kaufmann, NP. Referred out to the following hospitals:   Destination  Service Provider Address Phone Fax  [facility]  [address] [name] [address] [phone]  [facility]  [address] [name] [address] [phone]  [facility] [address]  [address], [name] [address] [phone]  [facility]  [address] [name] [address] [phone]  [facility]  [phone] [address] [name] [address] [phone]  [facility]  [address] [place], [name] [address] [phone]  [facility]  [address] [name] [address] [phone]  [facility]  [address] [name] [address] [phone]  [facility]  [address], [name] [address] [phone]  [facility]  [address], [name] [address] [phone]  [facility]  [address], [name] [address] [phone]  [facility]  [address], [name] [address] [phone]  [facility]  [address] [name] [address] [phone]  [facility]  [address] [name] [address] [phone]  [facility]  [address], [name] [address] [phone]  [facility]  [address], [name] [address] [phone]  [facility]  [address] [name] [address] [phone]   [facility]  [address], [name] [address] [phone]  [facility]  [address] [name] [address] [phone]  [facility]  [address] [name] [address] [phone]  [facility]  [address], [name] [address] [phone]  [facility]  [address] [name] [address] [phone]  [facility]  [address], [name] [address] [phone]  [facility]  [address] [name] [address] [phone]  [facility]  [address] [name] [address] [phone]  [facility]  [address] [name] [address] [phone]  [facility]  [address] [place], [name] [address] [phone]  [facility]  [address] [name] [address] [phone]  [facility]  [address] [name] [address] [phone]  [facility]  [address] [name] [address] [phone]     Disposition CSW will continue to follow for placement  Signed:  Corky Crafts, MSW, [place], LCASA 02/11/2021 4:18 PM

## 2021-02-11 NOTE — ED Notes (Signed)
Pt currently being TTS. 

## 2021-02-11 NOTE — ED Notes (Signed)
Patient speaking in phone with family member

## 2021-02-11 NOTE — ED Notes (Signed)
Pt belongs are at the yellow nurse states. Pt has black trash bag with multiple small trash bags in it. Pt also has white patient belonging bag.Pt expressed that he wanted it to stay in his room at all times. Pt made aware per policy it has to be at nurses station. Pt also has wheelchair at bedside. Pt reports this is his personal wheelchair.

## 2021-02-11 NOTE — ED Notes (Signed)
Pt is extremely argumentative upon arrival to ED 49. Pt is arguing about TV channel, wanting the door to be left open, wanting a call light, and refusing to let go of the IVC paperwork that is by his side. Pt's voice is raised and he is arguing with all staff attempting to settle him into new room. Security called and was able to retrieve paperwork which appears to be IVC papers from 02/09/21. Papers placed in pt's folder.

## 2021-02-11 NOTE — ED Notes (Signed)
Patient moved to bed 49 at this time, belonging taken and given to RN, Report given. IVC paperwork to nurse.

## 2021-02-11 NOTE — ED Notes (Addendum)
Pt refused to have vitals taken. Pt does not want anyone in the room other than the sitter that is not clinical.

## 2021-02-11 NOTE — Consult Note (Signed)
Telepsych Consultation   Reason for Consult:  Active suicidal ideation Referring Physician: EDP Location of Patient: [facility] Location of Provider: Behavioral Health TTS Department  Patient Identification: [name] MRN:  [id number] Principal Diagnosis: Schizoaffective disorder, bipolar type (HCC) Diagnosis:  Principal Problem:   Schizoaffective disorder, bipolar type (HCC)   Total Time spent with patient: 20 minutes  Subjective:   [name] is a 55 y.o. male patient admitted with active suicidal ideation.   HPI:    [name] in a 55 year old male with extensive substance abuse history. He has been evaluated in the ED recently several times in February 2022 and discharged from [facility] yesterday after he would not cooperative with staff. Discharge note from the ED indicates patient would not answer assessment questions and was disrespectful towards them. Per notes patient has been homeless. He also has numerous medical problems and is wheelchair bound. Yesterday after leaving the [facility] patient reports that he "wheeled myself in the road to kill myself. I am still suicidal. Just go on and discharge me so I can try again. I will wheel myself in the road or find some pills to take.  Nobody is helping me anyway." He became more agitated and argumentative as the assessment continued today. Patient remains uncooperative and notes indicate that he regularly uses profanity towards staff in the ED. He maintains that he is suicidal and is unable to contract for his safety.    Past Psychiatric History: Cocaine induced mood disorder  Risk to Self:  Yes Risk to Others:  Denies Prior Inpatient Therapy:  Yes Prior Outpatient Therapy:  Yes  Past Medical History:  Past Medical History:  Diagnosis Date  . Asthma   . Bipolar 1 disorder (HCC)   . CHF (congestive heart failure) (HCC)   . COPD (chronic obstructive pulmonary disease) (HCC)   . Depression   . Diabetes mellitus without  complication (HCC)   . GERD (gastroesophageal reflux disease)   . Homelessness   . Hypertension   . QT prolongation   . Schizoaffective disorder [facility])     Past Surgical History:  Procedure Laterality Date  . 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 IR ANGIO INTRA EXTRACRAN SEL COM CAROTID INNOMINATE BILAT MOD SED  12/17/2020  . IR ANGIO VERTEBRAL SEL VERTEBRAL BILAT MOD SED  12/17/2020  . RADIOLOGY WITH ANESTHESIA N/A 12/17/2020   Procedure: IR WITH ANESTHESIA;  Surgeon: Radiologist, Medication, MD;  Location: [facility] OR;  Service: Radiology;  Laterality: N/A;   Family History:  Family History  Problem Relation Age of Onset  . Drug abuse Mother   . Suicidality Mother    Family Psychiatric  History: Mother with substance abuse Social History:  Social History   Substance and Sexual Activity  Alcohol Use Yes   Comment: infrequently     Social History   Substance and Sexual Activity  Drug Use Yes  . Types: Cocaine, Marijuana, "Crack" cocaine    Social History   Socioeconomic History  . Marital status: Single    Spouse name: Not on file  . Number of children: 4  . Years of education: Not on file  . Highest education level: Not on file  Occupational History  . Occupation: N/A  Tobacco Use  . Smoking status: Current Every Day Smoker    Years: 30.00  Types: Cigarettes  . Smokeless tobacco: Never Used  Vaping Use  . Vaping Use: Never used  Substance and Sexual Activity  . Alcohol use: Yes    Comment: infrequently  . Drug use: Yes    Types: Cocaine, Marijuana, "Crack" cocaine  . Sexual activity: Not Currently    Partners: Female    Birth control/protection: None  Other Topics Concern  . Not on file  Social History Narrative   ** Merged History Encounter **       Social Determinants of Health   Financial  Resource Strain: Not on file  Food Insecurity: Not on file  Transportation Needs: Not on file  Physical Activity: Not on file  Stress: Not on file  Social Connections: Not on file   Additional Social History:    Allergies:   Allergies  Allergen Reactions  . Ibuprofen Anaphylaxis  . Ibuprofen Anaphylaxis  . Acetaminophen Hives, Nausea And Vomiting and Rash    Pt overdosed on bottle of APAP 05/06/16   . Latex Other (See Comments)    Reaction??  Roselee Nova Cr [Pregabalin Er] Other (See Comments)    Dystonia and dysarthria  . Peanut-Containing Drug Products Other (See Comments)    Allergic to peanut butter (reaction??)  . Tylenol [Acetaminophen] Hives and Nausea And Vomiting    Per pt he can't have it because it will kill him.    Labs:  Results for orders placed or performed during the hospital encounter of 02/10/21 (from the past 48 hour(s))  Comprehensive metabolic panel     Status: Abnormal   Collection Time: 02/10/21  6:38 PM  Result Value Ref Range   Sodium 137 135 - 145 mmol/L   Potassium 4.4 3.5 - 5.1 mmol/L   Chloride 106 98 - 111 mmol/L   CO2 20 (L) 22 - 32 mmol/L   Glucose, Bld 78 70 - 99 mg/dL    Comment: Glucose reference range applies only to samples taken after fasting for at least 8 hours.   BUN 13 6 - 20 mg/dL   Creatinine, Ser 9.38 0.61 - 1.24 mg/dL   Calcium 9.3 8.9 - 18.2 mg/dL   Total Protein 7.4 6.5 - 8.1 g/dL   Albumin 3.7 3.5 - 5.0 g/dL   AST 33 15 - 41 U/L   ALT 20 0 - 44 U/L   Alkaline Phosphatase 52 38 - 126 U/L   Total Bilirubin 1.2 0.3 - 1.2 mg/dL   GFR, Estimated >99 >37 mL/min    Comment: (NOTE) Calculated using the CKD-EPI Creatinine Equation (2021)    Anion gap 11 5 - 15    Comment: Performed at [facility], [address], [place], [name] [address]  Ethanol     Status: None   Collection Time: 02/10/21  6:38 PM  Result Value Ref Range   Alcohol, Ethyl (B) <10 <10 mg/dL    Comment: (NOTE) Lowest detectable limit for serum  alcohol is 10 mg/dL.  For medical purposes only. Performed at [facility], [address], [place], [name] [address]   Salicylate level     Status: Abnormal   Collection Time: 02/10/21  6:38 PM  Result Value Ref Range   Salicylate Lvl <7.0 (L) 7.0 - 30.0 mg/dL    Comment: Performed at [facility], [address] [place], [name] [address]  Acetaminophen level     Status: Abnormal   Collection Time: 02/10/21  6:38 PM  Result Value Ref Range  Acetaminophen (Tylenol), Serum <10 (L) 10 - 30 ug/mL    Comment: (NOTE) Therapeutic concentrations vary significantly. A range of 10-30 ug/mL  may be an effective concentration for many patients. However, some  are best treated at concentrations outside of this range. Acetaminophen concentrations >150 ug/mL at 4 hours after ingestion  and >50 ug/mL at 12 hours after ingestion are often associated with  toxic reactions.  Performed at [facility], [address] [place], [name] [address]   cbc     Status: Abnormal   Collection Time: 02/10/21  6:38 PM  Result Value Ref Range   WBC 6.4 4.0 - 10.5 K/uL   RBC 4.56 4.22 - 5.81 MIL/uL   Hemoglobin 14.5 13.0 - 17.0 g/dL   HCT 62.8 31.5 - 17.6 %   MCV 96.7 80.0 - 100.0 fL   MCH 31.8 26.0 - 34.0 pg   MCHC 32.9 30.0 - 36.0 g/dL   RDW 16.0 (H) 73.7 - 10.6 %   Platelets 203 150 - 400 K/uL   nRBC 0.0 0.0 - 0.2 %    Comment: Performed at [facility], [address], [place], [name] [address]    Medications:  No current facility-administered medications for this encounter.   Current Outpatient Medications  Medication Sig Dispense Refill  . albuterol (VENTOLIN HFA) 108 (90 Base) MCG/ACT inhaler Inhale 2 puffs into the lungs every 6 (six) hours as needed for wheezing or shortness of breath. 8 g 2  . aspirin 81 MG chewable tablet Chew 1 tablet (81 mg total) by mouth daily. 30 tablet 0  . atorvastatin (LIPITOR) 40 MG tablet Take 1 tablet (40 mg total) by mouth daily.  30 tablet 0  . atorvastatin (LIPITOR) 40 MG tablet Take 1 tablet (40 mg total) by mouth daily. 30 tablet 0  . carvedilol (COREG) 3.125 MG tablet Take 1 tablet (3.125 mg total) by mouth 2 (two) times daily with a meal. 60 tablet 0  . carvedilol (COREG) 3.125 MG tablet Take 1 tablet (3.125 mg total) by mouth 2 (two) times daily with a meal. 60 tablet 0  . cloNIDine (CATAPRES) 0.1 MG tablet Take 1 tablet (0.1 mg total) by mouth 3 (three) times daily. 90 tablet 0  . clopidogrel (PLAVIX) 75 MG tablet Take 1 tablet (75 mg total) by mouth daily. 30 tablet 2  . divalproex (DEPAKOTE ER) 500 MG 24 hr tablet Take 1 tablet (500 mg total) by mouth in the morning and at bedtime. 60 tablet 2  . divalproex (DEPAKOTE ER) 500 MG 24 hr tablet Take 1 tablet (500 mg total) by mouth in the morning and at bedtime. 60 tablet 0  . doxycycline (VIBRA-TABS) 100 MG tablet Take 1 tablet (100 mg total) by mouth every 12 (twelve) hours. 11 tablet 0  . furosemide (LASIX) 40 MG tablet Take 1 tablet (40 mg total) by mouth daily. 30 tablet 2  . furosemide (LASIX) 40 MG tablet Take 1 tablet (40 mg total) by mouth daily. 30 tablet 0  . gabapentin (NEURONTIN) 100 MG capsule Take 2 capsules (200 mg total) by mouth 2 (two) times daily. 60 capsule 2  . gabapentin (NEURONTIN) 300 MG capsule Take 2 capsules (600 mg total) by mouth 3 (three) times daily. 90 capsule 0  . hydrALAZINE (APRESOLINE) 50 MG tablet Take 1 tablet (50 mg total) by mouth every 8 (eight) hours. 90 tablet 2  . isosorbide dinitrate (ISORDIL) 5 MG tablet Take 1 tablet (5 mg total) by  mouth 3 (three) times daily. 90 tablet 0  . isosorbide mononitrate (IMDUR) 30 MG 24 hr tablet Take 1 tablet (30 mg total) by mouth daily. 30 tablet 2  . levETIRAcetam (KEPPRA) 500 MG tablet Take 1 tablet (500 mg total) by mouth 2 (two) times daily. 60 tablet 0  . lisinopril (ZESTRIL) 20 MG tablet Take 1 tablet (20 mg total) by mouth daily. 30 tablet 0  . magnesium oxide (MAG-OX) 400 (241.3 Mg)  MG tablet Take 1 tablet (400 mg total) by mouth 2 (two) times daily. 60 tablet 0  . neomycin-bacitracin-polymyxin (NEOSPORIN) ointment Apply 1 application topically 2 (two) times daily. 60 g 0  . predniSONE (DELTASONE) 10 MG tablet Take 1 tablet (10 mg total) by mouth daily with breakfast. 3 tablet 0  . sacubitril-valsartan (ENTRESTO) 24-26 MG Take 1 tablet by mouth 2 (two) times daily. 60 tablet 0  . senna-docusate (SENOKOT-S) 8.6-50 MG tablet Take 1 tablet by mouth at bedtime as needed for mild constipation. 30 tablet 0  . spironolactone (ALDACTONE) 25 MG tablet Take 0.5 tablets (12.5 mg total) by mouth daily. 15 tablet 2  . spironolactone (ALDACTONE) 25 MG tablet Take 0.5 tablets (12.5 mg total) by mouth daily. 30 tablet 6  . traZODone (DESYREL) 50 MG tablet Take 1 tablet (50 mg total) by mouth at bedtime. 30 tablet 2  . umeclidinium bromide (INCRUSE ELLIPTA) 62.5 MCG/INH AEPB Inhale 1 puff into the lungs daily. 30 each 2    Musculoskeletal:  Unable to assess via camera  Psychiatric Specialty Exam: Physical Exam  Review of Systems  Blood pressure (!) 146/88, pulse 81, resp. rate 20, SpO2 100 %.There is no height or weight on file to calculate BMI.  General Appearance: Disheveled  Eye Contact:  Fair  Speech:  Pressured  Volume:  Increased  Mood:  Irritable  Affect:  Labile  Thought Process:  Irrelevant  Orientation:  Full (Time, Place, and Person)  Thought Content:  Rumination  Suicidal Thoughts:  Yes.  with intent/plan  Homicidal Thoughts:  No  Memory:  Immediate;   Fair Recent;   Fair Remote;   Fair  Judgement:  Poor  Insight:  Lacking  Psychomotor Activity:  Increased  Concentration:  Concentration: Fair and Attention Span: Fair  Recall:  Poor  Fund of Knowledge:  Poor  Language:  Fair  Akathisia:  No  Handed:  Right  AIMS (if indicated):     Assets:  Communication Skills Resilience  ADL's:  Impaired  Cognition:  WNL  Sleep:        Treatment Plan Summary: Plan  Discussed this morning with Dr. Jola [name]. Patient [facility] need [facility]. He is unable to contract for safety and continues to verbalize suicidal plans. Patient has a history of being uncooperative with treatment recommendations. He has been recommended depakote for mood stabilization last month per notes in epic but is non-compliant with recommended treatments. History of prolonged QTc which has limited use of antipsychotic medications.   Disposition: Recommend psychiatric Inpatient admission when medically cleared. Supportive therapy provided about ongoing stressors. Discussed crisis plan, support from social network, calling 911, coming to the Emergency Department, and calling Suicide Hotline.  This service was provided via telemedicine using a 2-way, interactive audio and video technology.  Names of all persons participating in this telemedicine service and their role in this encounter. Name: Fransisca Kaufmann  Role: NP-C  Name: [name]  Role: Patient  Name:  Role:   Name:  Role:  Fransisca KaufmannAVIS, LAURA, NP 02/11/2021 11:19 AM

## 2021-02-11 NOTE — ED Notes (Signed)
Patient refused vitals-Monique,RN  

## 2021-02-11 NOTE — BH Assessment (Signed)
Comprehensive Clinical Assessment (CCA) Screening, Triage and Referral Note  02/11/2021 [name] [id number]  Options For Referral: [name] Abts, PA, reviewed pt's chart and information and determined pt should be observed overnight for safety and stability and re-assessed in the morning by psychiatry. Pt is to remain at [facility] due to the [facility]'s inability to accommodate pt's wheelchair. This information was relayed to pt's providers at 0316.   The patient demonstrates the following risk factors for suicide: Chronic risk factors for suicide include: psychiatric disorder of Schizo-effective Disorder, Bipolar Type, substance use disorder, medical illness (pt had his two big toes amputated 01/02/2021) and demographic factors (male, >15 y/o). Acute risk factors for suicide include: homelessness. Protective factors for this patient include: pt's desire for treatment to help with his mental health concerns. Considering these factors, the overall suicide risk at this point appears to be high. Patient is not appropriate for outpatient follow up.  Therefore, a 1:1 sitter for suicide precautions is recommended. Pt's providers, Dr. Blinda [name] and [name] Sites, PA, as well as his nurses, [name] and [name], were provided this information at [phone].   Flowsheet Row ED from 02/10/2021 in [facility] EMERGENCY DEPARTMENT Most recent reading at 02/11/2021  3:06 AM ED from 02/10/2021 in [facility] Most recent reading at 02/10/2021  3:51 PM ED from 02/09/2021 in [facility]ENCY [facility]PT Most recent reading at 02/09/2021  2:32 PM  C-SSRS RISK CATEGORY High Risk High Risk Error: Question 1 not populated       Chief Complaint:  Chief Complaint  Patient presents with  . Suicidal   Visit Diagnosis: F25.0, Schizoaffective disorder, Bipolar type  [name] is a 55 year old patient who was brought to [facility] via [employer] under IVC paperwork due to pt expressing  active SI with a plan to get hit by a car. The IVC states:  "Patient brought to [facility] by law enforcement after found in street. Patient verbalizes active suicidal ideations with a plan to intentionally be hit by [a] car."  Of note, pt's speech was garbled (baseline) and he was difficult to understand at times. When pt was asked why he was currently at the hospital, he stated, "I'm hearing voices;" he states he began hearing voices on Saturday (02/09/2021). Pt states he has been prescribed Depakote for this symptom but denies he is currently taking it. Pt denies he has a psychiatrist or a therapist. When asked if he has been provided information about obtaining services, including on the 2nd floor of the [facility], where he was earlier today, pt became angry and rude towards clinician, making threats to walk out in front of a car to intentionally get hit and stating that clinician doesn't "get it." When asked what clinician was not understanding, pt only shook his head.  Pt endorses SI, stating that, if he is not given help, he will leave the hospital and attempt to intentionally get hit by a car. When asked if pt is experiencing HI or has a plan to harm/kill anyone, pt stated, "yes and no." Pt denies VH, NSSIB, access to guns/weapons, engagement with the legal system, or SA; specifically pt stated he has not used cocaine, or any other substance other than what has been prescribed to him, in 2 months. Of note, pt's UDA was positive for cocaine on 02/09/2021.  Pt is requesting to be sent to [facility], or [place]. He is homeless and shares he has difficulties with transportation.  Pt states he had his two big toes amputated "without them asking me" on Wednesday; he later stated he had them amputated on Monday or Tuesday. Documentation in pt's chart reveals pt had his toes amputated 01/02/2021. He was also at [facility] from 12/17/2020 - 02/03/2021 (for 48 days) due to a stroke,  his toe amputation, etc.  Pt did not provide verbal consent for clinician to make contact with friends/family for collateral information.  Pt's orientation was UTA. Pt's memory is also UTA, as it's unclear if he is unintentionally providing false information (re: when his toes were amputated, how long it's been since he used cocaine, etc.). Pt was initially cooperative with clinician for the assessment but later became irritable. Pt's insight, judgement, and impulse control is poor at this time.   Patient Reported Information How did you hear about [place]? Other (Comment) ([employer])   Referral name: [employer]   Referral phone number: 0 (N/A)  Whom do you see for routine medical problems? Hospital ER   Practice/Facility Name: Various   Practice/Facility Phone Number: [phone] (Unknown)   Name of Contact: No data recorded  Contact Number: No data recorded  Contact Fax Number: No data recorded  Prescriber Name: Various providers   Prescriber Address (if known): Multiple  What Is the Reason for Your Visit/Call Today? Pt was lying in the road attempting to get hit by a car  How Long Has This Been Causing You Problems? <Week  Have You Recently Been in Any Inpatient Treatment (Hospital/Detox/Crisis Center/28-Day Program)? No   Name/Location of Program/Hospital:No data recorded  How Long Were You There? No data recorded  When Were You Discharged? No data recorded Have You Ever Received Services From [facility] Before? Yes   Who Do You See at [facility]? Multiple ED providers, several podiatry and neurology providers  Have You Recently Had Any Thoughts About Hurting Yourself? Yes   Are You Planning to Commit Suicide/Harm Yourself At This time?  Yes  Have you Recently Had Thoughts About Hurting Someone [name]? -- (Pt states he cannot answer this question)   Explanation: No data recorded Have You Used Any Alcohol or Drugs in the Past 24 Hours? -- (Pt denies he has used any substances in 2 months,  though his UDA was positive for cocaine on 02/09/2021)   How Long Ago Did You Use Drugs or Alcohol?  No data recorded  What Did You Use and How Much? No data recorded What Do You Feel Would Help You the Most Today? Medication(s); Transportation Assistance  Do You Currently Have a Therapist/Psychiatrist? No   Name of Therapist/Psychiatrist: No data recorded  Have You Been Recently Discharged From Any Office Practice or Programs? No   Explanation of Discharge From Practice/Program:  No data recorded    CCA Screening Triage Referral Assessment Type of Contact: Tele-Assessment   Is this Initial or Reassessment? Initial Assessment   Date Telepsych consult ordered in [facility]:  02/10/2021   Time Telepsych consult ordered in [facility]:  2346  Patient Reported Information Reviewed? Yes   Patient Left Without Being Seen? No data recorded  Reason for Not Completing Assessment: No data recorded Collateral Involvement: Pt declined to provide verbal consent for clinician to make contact with friends/family for colalteral information  Does Patient Have a Court Appointed Legal Guardian? No data recorded  Name and Contact of Legal Guardian:  No data recorded If Minor and Not Living with Parent(s), Who has Custody? N/A  Is CPS involved or ever been involved? Never  Is APS involved or ever been involved? Never  Patient Determined To Be At Risk for Harm To Self or Others Based on Review of Patient Reported Information or Presenting Complaint? Yes, for Self-Harm   Method: No data recorded  Availability of Means: No data recorded  Intent: No data recorded  Notification Required: No data recorded  Additional Information for Danger to Others Potential:  No data recorded  Additional Comments for Danger to Others Potential:  No data recorded  Are There Guns or Other Weapons in Your Home?  No data recorded   Types of Guns/Weapons: No data recorded   Are These Weapons Safely Secured?                               No data recorded   Who Could Verify You Are Able To Have These Secured:    No data recorded Do You Have any Outstanding Charges, Pending Court Dates, Parole/Probation? No data recorded Contacted To Inform of Risk of Harm To Self or Others: Law Enforcement Mudlogger is informed of pt's plan to kill himself via getting hit by a car)  Location of Assessment: [facility] ED  Does Patient Present under Involuntary Commitment? Yes   IVC Papers Initial File Date: 02/10/2021   [place] of Residence: [place]  Patient Currently Receiving the Following Services: Not Receiving Services   Determination of Need: Emergent (2 hours)   Options For Referral: Outpatient Therapy; Medication Management (Overnight Observation) [name] Abts, PA, reviewed pt's chart and information and determined pt should be observed overnight for safety and stability and re-assessed in the morning by psychiatry. Pt is to remain at [facility] due to the [facility]'s inability to accommodate pt's wheelchair. This information was relayed to pt's providers at 0316.   Ralph Dowdy, LMFT

## 2021-02-12 DIAGNOSIS — L97524 Non-pressure chronic ulcer of other part of left foot with necrosis of bone: Secondary | ICD-10-CM

## 2021-02-12 DIAGNOSIS — L97514 Non-pressure chronic ulcer of other part of right foot with necrosis of bone: Secondary | ICD-10-CM | POA: Insufficient documentation

## 2021-02-12 MED ORDER — DIVALPROEX SODIUM ER 500 MG PO TB24
500.0000 mg | ORAL_TABLET | Freq: Two times a day (BID) | ORAL | Status: DC
  Administered 2021-02-12 (×2): 500 mg via ORAL
  Filled 2021-02-12 (×3): qty 1

## 2021-02-12 MED ORDER — ALBUTEROL SULFATE HFA 108 (90 BASE) MCG/ACT IN AERS: 2.0000 | INHALATION_SPRAY | Freq: Four times a day (QID) | RESPIRATORY_TRACT | Status: DC | PRN

## 2021-02-12 MED ORDER — ASPIRIN 81 MG PO CHEW
81.0000 mg | CHEWABLE_TABLET | Freq: Every day | ORAL | Status: DC
  Administered 2021-02-12: 81 mg via ORAL
  Filled 2021-02-12 (×2): qty 1

## 2021-02-12 MED ORDER — ISOSORBIDE DINITRATE 5 MG PO TABS
5.0000 mg | ORAL_TABLET | Freq: Three times a day (TID) | ORAL | Status: DC
  Administered 2021-02-12 (×2): 5 mg via ORAL
  Filled 2021-02-12 (×5): qty 1

## 2021-02-12 MED ORDER — ALBUTEROL SULFATE HFA 108 (90 BASE) MCG/ACT IN AERS
2.0000 | INHALATION_SPRAY | Freq: Once | RESPIRATORY_TRACT | Status: AC
  Administered 2021-02-12: 2 via RESPIRATORY_TRACT
  Filled 2021-02-12: qty 6.7

## 2021-02-12 MED ORDER — CARVEDILOL 3.125 MG PO TABS
3.1250 mg | ORAL_TABLET | Freq: Two times a day (BID) | ORAL | Status: DC
  Filled 2021-02-12: qty 1

## 2021-02-12 MED ORDER — FUROSEMIDE 20 MG PO TABS
40.0000 mg | ORAL_TABLET | Freq: Every day | ORAL | Status: DC
  Administered 2021-02-12: 40 mg via ORAL
  Filled 2021-02-12 (×2): qty 2

## 2021-02-12 MED ORDER — LEVETIRACETAM 500 MG PO TABS
500.0000 mg | ORAL_TABLET | Freq: Two times a day (BID) | ORAL | Status: DC
  Administered 2021-02-12 (×2): 500 mg via ORAL
  Filled 2021-02-12 (×3): qty 1

## 2021-02-12 MED ORDER — ATORVASTATIN CALCIUM 40 MG PO TABS
40.0000 mg | ORAL_TABLET | Freq: Every day | ORAL | Status: DC
  Administered 2021-02-12: 40 mg via ORAL
  Filled 2021-02-12 (×2): qty 1

## 2021-02-12 MED ORDER — SILVER SULFADIAZINE 1 % EX CREA: TOPICAL_CREAM | Freq: Every day | CUTANEOUS | Status: DC

## 2021-02-12 MED ORDER — GABAPENTIN 300 MG PO CAPS
600.0000 mg | ORAL_CAPSULE | Freq: Three times a day (TID) | ORAL | Status: DC
  Administered 2021-02-12 (×2): 600 mg via ORAL
  Filled 2021-02-12 (×3): qty 2

## 2021-02-12 MED ORDER — MAGNESIUM OXIDE 400 (241.3 MG) MG PO TABS
400.0000 mg | ORAL_TABLET | Freq: Two times a day (BID) | ORAL | Status: DC
  Administered 2021-02-12 (×2): 400 mg via ORAL
  Filled 2021-02-12 (×3): qty 1

## 2021-02-12 MED ORDER — SACUBITRIL-VALSARTAN 24-26 MG PO TABS
1.0000 | ORAL_TABLET | Freq: Two times a day (BID) | ORAL | Status: DC
  Administered 2021-02-12 (×2): 1 via ORAL
  Filled 2021-02-12 (×4): qty 1

## 2021-02-12 MED ORDER — SPIRONOLACTONE 12.5 MG HALF TABLET
12.5000 mg | ORAL_TABLET | Freq: Every day | ORAL | Status: DC
  Administered 2021-02-12: 12.5 mg via ORAL
  Filled 2021-02-12 (×2): qty 1

## 2021-02-12 MED ORDER — CLONIDINE HCL 0.2 MG PO TABS
0.1000 mg | ORAL_TABLET | Freq: Every day | ORAL | Status: DC
  Administered 2021-02-12: 0.1 mg via ORAL
  Filled 2021-02-12 (×2): qty 1

## 2021-02-12 NOTE — Discharge Planning (Signed)
Licensed Clinical Social Worker is seeking post-discharge placement for this patient at the following level of care: Inpatient Psych.

## 2021-02-12 NOTE — ED Provider Notes (Signed)
Emergency Medicine Observation Re-evaluation Note  [name] is a 55 y.o. male, seen on rounds today.  Pt initially presented to the ED for complaints of Suicidal Currently, the patient is sleeping.  Physical Exam  BP (!) 148/104 (BP Location: Right Arm)   Pulse 94   Temp 98.5 F (36.9 C) (Oral)   Resp 16   SpO2 100%  Physical Exam Respirations even unlabored  ED Course / MDM  EKG:    I have reviewed the labs performed to date as well as medications administered while in observation.  Recent changes in the last 24 hours include tinges to refuse vital sign check.  Wheelchair at bedside if needed.  Plan  Current plan is for placement however this is difficult due to his need for a wheelchair. Patient is under full IVC at this time.   Jeannie Fend, PA-C 02/12/21 1310    Lorre Nick, MD 02/13/21 [phone]

## 2021-02-12 NOTE — Consult Note (Signed)
Reason for Consult: s/p bilateral hallux amputation Referring Physician: [name] is an 55 y.o. male.  HPI: Patient known to provider, s/p bilateral hallux amputations 01/02/21. Patient has not followed up since procedure. Presented in the ED for psych eval, pending inpatient admission. Patient rather combative during exam and evaluation.  Past Medical History:  Diagnosis Date   Asthma    Bipolar 1 disorder (HCC)    CHF (congestive heart failure) (HCC)    COPD (chronic obstructive pulmonary disease) (HCC)    Depression    Diabetes mellitus without complication (HCC)    GERD (gastroesophageal reflux disease)    Homelessness    Hypertension    QT prolongation    Schizoaffective disorder [facility])     Past Surgical History:  Procedure Laterality Date   AMPUTATION TOE Bilateral 01/02/2021   Procedure: AMPUTATION TOE;  Surgeon: Park Liter, DPM;  Location: [facility] OR;  Service: Podiatry;  Laterality: Bilateral;  Ok to leave patient on hospital bed   IR ANGIO INTRA EXTRACRAN SEL COM CAROTID INNOMINATE BILAT MOD SED  12/17/2020   IR ANGIO VERTEBRAL SEL VERTEBRAL BILAT MOD SED  12/17/2020   RADIOLOGY WITH ANESTHESIA N/A 12/17/2020   Procedure: IR WITH ANESTHESIA;  Surgeon: Radiologist, Medication, MD;  Location: [facility] OR;  Service: Radiology;  Laterality: N/A;    Family History  Problem Relation Age of Onset   Drug abuse Mother    Suicidality Mother     Social History:  reports that he has been smoking cigarettes. He has smoked for the past 30.00 years. He has never used smokeless tobacco. He reports current alcohol use. He reports current drug use. Drugs: Cocaine, Marijuana, and "Crack" cocaine.  Allergies:  Allergies  Allergen Reactions   Ibuprofen Anaphylaxis   Ibuprofen Anaphylaxis   Acetaminophen Hives, Nausea And Vomiting and Rash    Pt overdosed on bottle of APAP 05/06/16    Latex Other (See Comments)    Reaction??   Lyrica Cr [Pregabalin Er] Other  (See Comments)    Dystonia and dysarthria   Peanut-Containing Drug Products Other (See Comments)    Allergic to peanut butter (reaction??)   Tylenol [Acetaminophen] Hives and Nausea And Vomiting    Per pt he can't have it because it will kill him.    Medications: I have reviewed the patient's current medications.  Results for orders placed or performed during the hospital encounter of 02/10/21 (from the past 48 hour(s))  Comprehensive metabolic panel     Status: Abnormal   Collection Time: 02/10/21  6:38 PM  Result Value Ref Range   Sodium 137 135 - 145 mmol/L   Potassium 4.4 3.5 - 5.1 mmol/L   Chloride 106 98 - 111 mmol/L   CO2 20 (L) 22 - 32 mmol/L   Glucose, Bld 78 70 - 99 mg/dL    Comment: Glucose reference range applies only to samples taken after fasting for at least 8 hours.   BUN 13 6 - 20 mg/dL   Creatinine, Ser 4.27 0.61 - 1.24 mg/dL   Calcium 9.3 8.9 - 06.2 mg/dL   Total Protein 7.4 6.5 - 8.1 g/dL   Albumin 3.7 3.5 - 5.0 g/dL   AST 33 15 - 41 U/L   ALT 20 0 - 44 U/L   Alkaline Phosphatase 52 38 - 126 U/L   Total Bilirubin 1.2 0.3 - 1.2 mg/dL   GFR, Estimated >37 >62 mL/min    Comment: (NOTE) Calculated using the CKD-EPI Creatinine Equation (2021)  Anion gap 11 5 - 15    Comment: Performed at [facility], [address] [place], [name] [address]  Ethanol     Status: None   Collection Time: 02/10/21  6:38 PM  Result Value Ref Range   Alcohol, Ethyl (B) <10 <10 mg/dL    Comment: (NOTE) Lowest detectable limit for serum alcohol is 10 mg/dL.  For medical purposes only. Performed at [facility], [address], [place], [name] [address]   Salicylate level     Status: Abnormal   Collection Time: 02/10/21  6:38 PM  Result Value Ref Range   Salicylate Lvl <7.0 (L) 7.0 - 30.0 mg/dL    Comment: Performed at [facility], [address] [place], [name] [address]  Acetaminophen level     Status: Abnormal   Collection Time: 02/10/21  6:38  PM  Result Value Ref Range   Acetaminophen (Tylenol), Serum <10 (L) 10 - 30 ug/mL    Comment: (NOTE) Therapeutic concentrations vary significantly. A range of 10-30 ug/mL  may be an effective concentration for many patients. However, some  are best treated at concentrations outside of this range. Acetaminophen concentrations >150 ug/mL at 4 hours after ingestion  and >50 ug/mL at 12 hours after ingestion are often associated with  toxic reactions.  Performed at [facility], [address], [place], [name] [address]   cbc     Status: Abnormal   Collection Time: 02/10/21  6:38 PM  Result Value Ref Range   WBC 6.4 4.0 - 10.5 K/uL   RBC 4.56 4.22 - 5.81 MIL/uL   Hemoglobin 14.5 13.0 - 17.0 g/dL   HCT 01.6 01.0 - 93.2 %   MCV 96.7 80.0 - 100.0 fL   MCH 31.8 26.0 - 34.0 pg   MCHC 32.9 30.0 - 36.0 g/dL   RDW 35.5 (H) 73.2 - 20.2 %   Platelets 203 150 - 400 K/uL   nRBC 0.0 0.0 - 0.2 %    Comment: Performed at [facility], [address] [place], [name] [address]  Rapid urine drug screen (hospital performed)     Status: Abnormal   Collection Time: 02/11/21  3:55 AM  Result Value Ref Range   Opiates NONE DETECTED NONE DETECTED   Cocaine POSITIVE (A) NONE DETECTED   Benzodiazepines POSITIVE (A) NONE DETECTED   Amphetamines NONE DETECTED NONE DETECTED   Tetrahydrocannabinol NONE DETECTED NONE DETECTED   Barbiturates NONE DETECTED NONE DETECTED    Comment: (NOTE) DRUG SCREEN FOR MEDICAL PURPOSES ONLY.  IF CONFIRMATION IS NEEDED FOR ANY PURPOSE, NOTIFY LAB WITHIN 5 DAYS.  LOWEST DETECTABLE LIMITS FOR URINE DRUG SCREEN Drug Class                     Cutoff (ng/mL) Amphetamine and metabolites    1000 Barbiturate and metabolites    200 Benzodiazepine                 200 Tricyclics and metabolites     300 Opiates and metabolites        300 Cocaine and metabolites        300 THC                            50 Performed at [facility], [address],  [place], [name] [address]     No results found.  ROS  Blood pressure (!) 148/104, pulse 94, temperature 98.5 F (36.9 C), temperature source Oral, resp. rate 16, SpO2 100 %.  Vitals:   02/11/21 1445 02/12/21 1142  BP: (!) 145/85 (!) 148/104  Pulse: 80 94  Resp: 18 16  Temp: 98 F (36.7 C) 98.5 F (36.9 C)  SpO2: 100% 100%    General Alert. Combative.  Vascular Foot warm to touch  Neurologic Epicritic sensation grossly present.  Dermatologic (Wound) Bilateral great toe amputation sites appear to be healing well - there is some significant dried callus/blood around the wounds. Hard to fully appreciate without debridement.  Orthopedic: Motor intact BLE.    Assessment/Plan:  S/p bilateral great toe amputations -Appear to be healing. Significant hyperkeratosis noted. No signs of infection. Exam slightly limited - ideally would benefit from debridement however with patient being rather combative unable and unsafe to do so today. -Staples removed left foot. -Dress with silvadene and DSD. -Patient to f/u at d/c. If his housing situation causes prolonged inpatient stay please contact for further eval. -Podiatry to sign off.  Park Liter 02/12/2021, 5:42 PM   Best available via secure chat for questions or concerns.

## 2021-02-12 NOTE — Evaluation (Signed)
Physical Therapy Evaluation Patient Details Name: [name] MRN: [id number] DOB: [birth year] Today's Date: 02/12/2021   History of Present Illness  Pt is a 55 y/o male presenting to the ED secondary to suicidal ideation. Pt with recent admission secondary to bilateral great toe amputations and R cerebellar CVA. PMH includes schizoaffective disorder and DM.  Clinical Impression  Pt presenting with problem above with deficits below. Pt requiring min A to sit at EOB. Once sitting, pt reporting he could not perform further mobility. Continue to note ataxic movement in RUE>RLE, but likely baseline from recent CVA. Per notes, plan is for pt to go to inpatient psych facility. Will continue to follow acutely to maximize functional mobility independence and safety.     Follow Up Recommendations Other (comment) (inpatient psych)    Equipment Recommendations  None recommended by PT    Recommendations for Other Services       Precautions / Restrictions Precautions Precautions: Fall Precaution Comments: Pt very easily agitated Other Brace: Supposed to have post op shoes per previous notes, but does not have them with him Restrictions Weight Bearing Restrictions: Yes RLE Weight Bearing: Weight bearing as tolerated LLE Weight Bearing: Weight bearing as tolerated Other Position/Activity Restrictions: Per previous notes, pt WBAT in post op shoes.      Mobility  Bed Mobility Overal bed mobility: Needs Assistance Bed Mobility: Supine to Sit;Sit to Supine     Supine to sit: Min assist Sit to supine: Modified independent (Device/Increase time)   General bed mobility comments: Pt asking PT to assist with trunk to sit up. Required min A. Pt able to help RLE off of bed once in long sitting. Somewhat self limiting and would not perform further mobility.    Transfers                    Ambulation/Gait                Stairs            Wheelchair Mobility    Modified  Rankin (Stroke Patients Only)       Balance Overall balance assessment: Needs assistance Sitting-balance support: No upper extremity supported Sitting balance-Leahy Scale: Good                                       Pertinent Vitals/Pain Pain Assessment: Faces Faces Pain Scale: Hurts little more Pain Location: bilateral feet Pain Descriptors / Indicators: Grimacing;Guarding Pain Intervention(s): Limited activity within patient's tolerance;Monitored during session    Home Living Family/patient expects to be discharged to:: Shelter/Homeless                      Prior Function Level of Independence: Independent with assistive device(s)         Comments: Pt reports he is using a WC. Per previous PT notes, was able to ambulate with RW     Hand Dominance        Extremity/Trunk Assessment   Upper Extremity Assessment Upper Extremity Assessment: Defer to OT evaluation;RUE deficits/detail RUE Deficits / Details: Noted ataxia in RUE and very poor control.    Lower Extremity Assessment Lower Extremity Assessment: RLE deficits/detail;LLE deficits/detail RLE Deficits / Details: s/p R great toe amputation. Decreased coordination since CVA LLE Deficits / Details: s/p L great toe amputation.    Cervical / Trunk Assessment Cervical / Trunk Assessment: Normal  Communication   Communication: Expressive difficulties  Cognition Arousal/Alertness: Awake/alert Behavior During Therapy: Restless Overall Cognitive Status: No family/caregiver present to determine baseline cognitive functioning                                 General Comments: Pt very repetitive during session. Required step by step instructions to avoid agitation. Very frustrated about previous stay.      General Comments      Exercises     Assessment/Plan    PT Assessment Patient needs continued PT services  PT Problem List Decreased activity tolerance;Decreased  balance;Decreased mobility;Decreased coordination;Decreased strength;Decreased safety awareness;Decreased cognition;Decreased knowledge of use of DME;Decreased knowledge of precautions       PT Treatment Interventions DME instruction;Gait training;Functional mobility training;Therapeutic activities;Balance training;Patient/family education;Therapeutic exercise;Neuromuscular re-education;Cognitive remediation    PT Goals (Current goals can be found in the Care Plan section)  Acute Rehab PT Goals Patient Stated Goal: to go to rehab PT Goal Formulation: With patient Time For Goal Achievement: 02/26/21 Potential to Achieve Goals: Fair    Frequency Min 2X/week   Barriers to discharge Decreased caregiver support;Inaccessible home environment      Co-evaluation               AM-PAC PT "6 Clicks" Mobility  Outcome Measure Help needed turning from your back to your side while in a flat bed without using bedrails?: None Help needed moving from lying on your back to sitting on the side of a flat bed without using bedrails?: A Little Help needed moving to and from a bed to a chair (including a wheelchair)?: A Little Help needed standing up from a chair using your arms (e.g., wheelchair or bedside chair)?: A Little Help needed to walk in hospital room?: A Little Help needed climbing 3-5 steps with a railing? : A Lot 6 Click Score: 18    End of Session   Activity Tolerance: Patient limited by pain Patient left: in bed;with call bell/phone within reach;with nursing/sitter in room Nurse Communication: Mobility status PT Visit Diagnosis: Other abnormalities of gait and mobility (R26.89);Other symptoms and signs involving the nervous system (R29.898);Difficulty in walking, not elsewhere classified (R26.2)    Time: 9562-1308 PT Time Calculation (min) (ACUTE ONLY): 16 min   Charges:   PT Evaluation $PT Eval Moderate Complexity: 1 Mod          Farley Ly, PT, DPT  Acute  Rehabilitation Services  Pager: [phone] Office: [phone]   Lehman Prom 02/12/2021, 4:35 PM

## 2021-02-12 NOTE — ED Notes (Signed)
ED Provider at bedside to assess patient; new orders being placed-Monique,RN

## 2021-02-12 NOTE — ED Notes (Signed)
Patient was Transferred into a Regular Bed from a Stretcher.

## 2021-02-12 NOTE — ED Provider Notes (Addendum)
Emergency Medicine Observation Re-evaluation Note  [name] is a 55 y.o. male, seen on rounds today.  Pt initially presented to the ED for complaints of frustrated about medical issues and recent stroke, with suicidal thoughts.   Currently, pt calm, alert, cooperative, eating his lunch.   Physical Exam  BP (!) 148/104 (BP Location: Right Arm)   Pulse 94   Temp 98.5 F (36.9 C) (Oral)   Resp 16   SpO2 100%  Physical Exam General: alert, content. Cardiac: regular rate Lungs: breathing comfortably. Psych: alert, content. Normal mood/affect.   ED Course / MDM  EKG:    I have reviewed the labs performed to date as well as medications administered while in observation.  Recent changes in the last 24 hours include stabilization on meds, BH reassessment.   Plan  Patient indicates he has hx schizoaffective disorder. He indicates gets a monthly check, and is able to get his meds, etc.  He expresses frustration about recent prolonged inpatient stay, stroke, toe amputations, and housing situation.  Pt is alert, oriented. He has normal mood/affect.  He is not actively suicidal, denies desire and/or plan to end his life. No HI.  Pt does not appear to be responding to internal stimuli. No delusions or hallucinations. Patient does not appear to be acutely psychotic.  IVC rescinded.   Patient expresses his desire to travel to/live in [place]. He expresses desire to recover from prior toe surgery and stroke, and begin the next phase of his life.   Pts toe amputation/stump site intact without purulent drainage or malodor. No cellulitis. Staples are still intact. Will ask team that operated on patient to consult, check wounds, remove staples when appropriate, etc. (at patient request). Discussed with Dr Samuella Cota, he will see after office hours this afternoon.   Also consulted TOC team as to assist with social services and transitioning out of ED setting.    It appears patients meds at time of  discharge earlier this month not yet restarted - pts meds from recent d/c summary re-started. Pharmacy to do complete med reconciliation to make sure pts med list is up to date.        Cathren Laine, MD 02/12/21 [phone]

## 2021-02-12 NOTE — BH Assessment (Signed)
TTS reassessment: Patient reports that he initially came to ED for concerns with his right leg and having SI. Patient reports concerns with not being able to feel the right side of his body and has concerns about his toe amputation. Patient reports that a provider said he was going to be given Zyprexa and they haven't started him on the medications yet and he wants to know why. Patient reports that people in ED treating him like a dog and he states, "so I'm going to act like a dog" and patient reports urinating in the sink. Patient reports continued SI with plan of "y'all not going to give it to me anyway". Patient continues to discuss his medical issues. Clinician used active listening and continue reassessment. When asked about HI patient becomes more agitated and stated that staff are more concerned about him being suicidal versus what he is saying about his medical issues. Patient refused to complete assessment and states "I don't want to talk anymore get that away from me". Patient agitated during assessment, his eye contact was good, speech is pressed, slurred and tangential.  Patient continues to meet criteria for inpatient treatment.

## 2021-02-12 NOTE — ED Notes (Signed)
Patient demanding staff to call respiratory because he can not breath; pt does not appear to be in distress and speaking in full and complete sentences; RN advised that EDP will be notified but patient states he does not need the EDP he wants Respiratory. EDP notified of patient's request; Patient continues to refused vitals signs to be obtained-Monique,RN

## 2021-02-12 NOTE — ED Notes (Signed)
Podiatrist at bedside removing staples from foot.  Staples a bit grown in so was uncomfortable for the pt.  Doctor is ordering wound care and dressing.

## 2021-02-12 NOTE — ED Provider Notes (Signed)
I was called evaluated patient as he is demanding respiratory.  Patient is in no acute respiratory distress at this time.  Lung sounds are clear.  Patient appears mildly agitated. At this point he does not require respiratory therapy.  I did offer him an albuterol MDI in the meantime.  Patient reports he would accept this.  He has been refusing vitals.   Zadie Rhine, MD 02/12/21 [phone]

## 2021-02-13 MED ORDER — LORAZEPAM 2 MG/ML IJ SOLN: 2.0000 mg | Freq: Once | INTRAMUSCULAR | Status: DC

## 2021-02-13 MED ORDER — HALOPERIDOL LACTATE 5 MG/ML IJ SOLN: 5.0000 mg | Freq: Once | INTRAMUSCULAR | Status: DC

## 2021-02-13 NOTE — Progress Notes (Signed)
Pt was referred out to the following facilities per [facility], due to no appropriate beds at [facility].  Service Provider Address Phone Fax  [facility]  [address] [name] [address] [phone]  [facility]  [address] [name] [address] [phone]  [facility] [address]  [address], [name] [address] [phone]  [facility]  [address] [name] [address] [phone]  [facility]  [phone] [name] [address] [phone]  [facility]  [address] [place], [name] [address] [phone]  [facility]  [address] [name] [address] [phone]  [facility]  [address] [name] [address] [phone]  [facility]  [address] [name] [address] [phone]  [facility]  [address] [name] [address] [phone]  [facility]  [address] [name] [address] [phone]  [facility]  [address] [name] [address] [phone]  [facility]  [address] [name] [address] [phone]  [facility]  [address], [name] [address] [phone]  [facility]  [address], [name] [address] [phone]  [facility]  [address] [name] [address] [phone]  [facility]  [address], [name] [address] [phone]  [facility]  [address], [name] [address] [phone]  [facility]  [address] [name] [address] [phone]  [facility]  [address] [name] [address] [phone]  [facility]  [address] [name] [address] [phone]  [facility]  [address] [name] [address] [phone]  [facility]  [address], [name] [address] [phone]  [facility]  [address] [name] [address] [phone]  [facility]  [address] [name] [address] [phone]  [facility]  [address] [name] [address] [phone]  [facility]  [address] [place], [name] [address] [phone]  [facility]  [address] [name] [address] [phone]  [facility]  [address] [name] [address] [phone]  [facility]  [address] [name] [address] [phone]      Situation ongoing CSW will follow.

## 2021-02-13 NOTE — ED Provider Notes (Signed)
Emergency Medicine Observation Re-evaluation Note  [name] is a 55 y.o. male, seen on rounds today.  Pt initially presented to the ED for complaints of Mood disorder and Substance abuse Currently, the patient is eating lunch. Pt becomes irritated that he is still in ED, states he feels ready to go, requests d/c.   Physical Exam  BP (!) 133/100 (BP Location: Right Arm)   Pulse 73   Temp 97.8 F (36.6 C) (Oral)   Resp 18   SpO2 100%  Physical Exam General: eating lunch.  Cardiac: regular rate Lungs: breathing comfortably Psych: calm, alert, conversant. Periods of inappropriate behavior, insulting comments to staff.   ED Course / MDM  EKG:   I have reviewed the labs performed to date as well as medications administered while in observation.  Recent changes in the last 24 hours include pt reporting feeling improved, requests d/c.   Plan  Patient discussed with [facility] team. They indicate very familiar w patient, that pt continues to endorse cocaine use, that he has been resistant to help/assistance. TOC team confirms that patient does get monthly check, and that is able to use that income/support on housing and food.   Pt indicates he does not want to go to rehab or SNF, and is requesting discharge. Patient does not appear despondent or acutely depressed. He expresses no thoughts of harm to self or others. He does not appear to be responding to internal stimuli. No hallucinations or delusions are noted.   Podiatry evaluated pts toe amputation sites yesterday, removed staples, and indicates can f/u as outpatient.   TOC indicates pt has been provided community resources/social support resources.   Pt is afebrile, hr 74, rr 16, pulse ox 100%. He is alert, oriented. He does not appear acutely psychotic. No SI/HI.  Pt is requesting discharge.  Pt provided additional information including social service/resource guides. Pt is encouraged to f/u closely as outpatient.  Given his continued plan  to use drugs, and with his baseline comorbidities,and his resistance/noncompliance w advised meds and treatment plan, his long and short term prognosis is unfortunately quite limited. After eating lunch, pt requests d/c.       Cathren Laine, MD 02/13/21 [phone]
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
## 2021-02-13 NOTE — ED Notes (Signed)
Pt refuses Vitals unable to update in system

## 2021-02-13 NOTE — ED Notes (Signed)
PT refused vitals at DC . Pt A/O . Pt assisted to DC via WC by [employer].
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
## 2021-02-13 NOTE — ED Notes (Signed)
Pt refuses vitals and Pt refuses medications. [name]

## 2021-02-13 NOTE — Discharge Instructions (Addendum)
It was our pleasure to provide your ER care today - we hope that you feel better.  Please be sure to take your medication as prescribed.   Avoid any cocaine/drug use - use  resource guide provided as relates accessing community resources.   Follow up with your foot doctor in 1-2 weeks - call office to arrange appointment.   Follow up with primary care doctor in 1- 2 weeks.   For mental health issues and/or crisis, you may go directly to [facility] - it is open 24/7 and walk-ins are welcome.   Return to ER if worse, new symptoms, fevers, chest pain, trouble breathing, infection of wounds (pus, spreading redness, increased swelling), or other concern.

## 2021-02-13 NOTE — ED Notes (Signed)
Pt. Refusing to have vital signs taken.  
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
## 2021-02-18 ENCOUNTER — Other Ambulatory Visit: Payer: Self-pay

## 2021-02-18 ENCOUNTER — Encounter (HOSPITAL_COMMUNITY): Payer: Self-pay

## 2021-02-18 DIAGNOSIS — M79672 Pain in left foot: Secondary | ICD-10-CM | POA: Diagnosis not present

## 2021-02-18 DIAGNOSIS — M79671 Pain in right foot: Secondary | ICD-10-CM | POA: Diagnosis present

## 2021-02-18 DIAGNOSIS — I5043 Acute on chronic combined systolic (congestive) and diastolic (congestive) heart failure: Secondary | ICD-10-CM | POA: Diagnosis not present

## 2021-02-18 DIAGNOSIS — J45909 Unspecified asthma, uncomplicated: Secondary | ICD-10-CM | POA: Diagnosis not present

## 2021-02-18 DIAGNOSIS — I2510 Atherosclerotic heart disease of native coronary artery without angina pectoris: Secondary | ICD-10-CM | POA: Diagnosis not present

## 2021-02-18 DIAGNOSIS — F17210 Nicotine dependence, cigarettes, uncomplicated: Secondary | ICD-10-CM | POA: Diagnosis not present

## 2021-02-18 DIAGNOSIS — Z79899 Other long term (current) drug therapy: Secondary | ICD-10-CM | POA: Insufficient documentation

## 2021-02-18 DIAGNOSIS — E119 Type 2 diabetes mellitus without complications: Secondary | ICD-10-CM | POA: Insufficient documentation

## 2021-02-18 DIAGNOSIS — Z7982 Long term (current) use of aspirin: Secondary | ICD-10-CM | POA: Diagnosis not present

## 2021-02-18 DIAGNOSIS — Z7902 Long term (current) use of antithrombotics/antiplatelets: Secondary | ICD-10-CM | POA: Insufficient documentation

## 2021-02-18 DIAGNOSIS — I110 Hypertensive heart disease with heart failure: Secondary | ICD-10-CM | POA: Insufficient documentation

## 2021-02-18 DIAGNOSIS — J449 Chronic obstructive pulmonary disease, unspecified: Secondary | ICD-10-CM | POA: Diagnosis not present

## 2021-02-18 NOTE — ED Triage Notes (Signed)
[place]. Pt sts having two toes surgically removed a month ago.

## 2021-02-19 ENCOUNTER — Emergency Department (HOSPITAL_COMMUNITY): Payer: Medicaid Other

## 2021-02-19 ENCOUNTER — Emergency Department (HOSPITAL_COMMUNITY)
Admission: EM | Admit: 2021-02-19 | Discharge: 2021-02-19 | Disposition: A | Discharge status: Home / Self Care | Payer: Medicaid Other | Attending: Emergency Medicine | Admitting: Emergency Medicine

## 2021-02-19 DIAGNOSIS — M79671 Pain in right foot: Secondary | ICD-10-CM

## 2021-02-19 LAB — CBG MONITORING, ED: Glucose-Capillary: 68 mg/dL — ABNORMAL LOW (ref 70–99)

## 2021-02-19 LAB — CBC WITH DIFFERENTIAL/PLATELET
Abs Immature Granulocytes: 0.01 10*3/uL (ref 0.00–0.07)
Basophils Absolute: 0 10*3/uL (ref 0.0–0.1)
Basophils Relative: 0 %
Eosinophils Absolute: 0.1 10*3/uL (ref 0.0–0.5)
Eosinophils Relative: 2 %
HCT: 40.9 % (ref 39.0–52.0)
Hemoglobin: 13.4 g/dL (ref 13.0–17.0)
Immature Granulocytes: 0 %
Lymphocytes Relative: 47 %
Lymphs Abs: 3.3 10*3/uL (ref 0.7–4.0)
MCH: 30.9 pg (ref 26.0–34.0)
MCHC: 32.8 g/dL (ref 30.0–36.0)
MCV: 94.2 fL (ref 80.0–100.0)
Monocytes Absolute: 0.7 10*3/uL (ref 0.1–1.0)
Monocytes Relative: 10 %
Neutro Abs: 2.9 10*3/uL (ref 1.7–7.7)
Neutrophils Relative %: 41 %
Platelets: 257 10*3/uL (ref 150–400)
RBC: 4.34 MIL/uL (ref 4.22–5.81)
RDW: 16.5 % — ABNORMAL HIGH (ref 11.5–15.5)
WBC: 7 10*3/uL (ref 4.0–10.5)
nRBC: 0 % (ref 0.0–0.2)

## 2021-02-19 LAB — BASIC METABOLIC PANEL
Anion gap: 9 (ref 5–15)
BUN: 18 mg/dL (ref 6–20)
CO2: 25 mmol/L (ref 22–32)
Calcium: 9.5 mg/dL (ref 8.9–10.3)
Chloride: 101 mmol/L (ref 98–111)
Creatinine, Ser: 0.99 mg/dL (ref 0.61–1.24)
GFR, Estimated: >60 mL/min (ref >60)
Glucose, Bld: 97 mg/dL (ref 70–99)
Potassium: 3.7 mmol/L (ref 3.5–5.1)
Sodium: 135 mmol/L (ref 135–145)

## 2021-02-19 IMAGING — CR DG FOOT COMPLETE 3+V*L*
3 series · 3 of 3 positions shown · non-contrast
Comparison: Radiograph [DATE]

CLINICAL DATA: Bilateral foot pain, swelling, bilateral first ray
amputations [DATE]

EXAM:
LEFT FOOT - COMPLETE 3+ VIEW; RIGHT FOOT COMPLETE - 3+ VIEW

[x foot ap left]
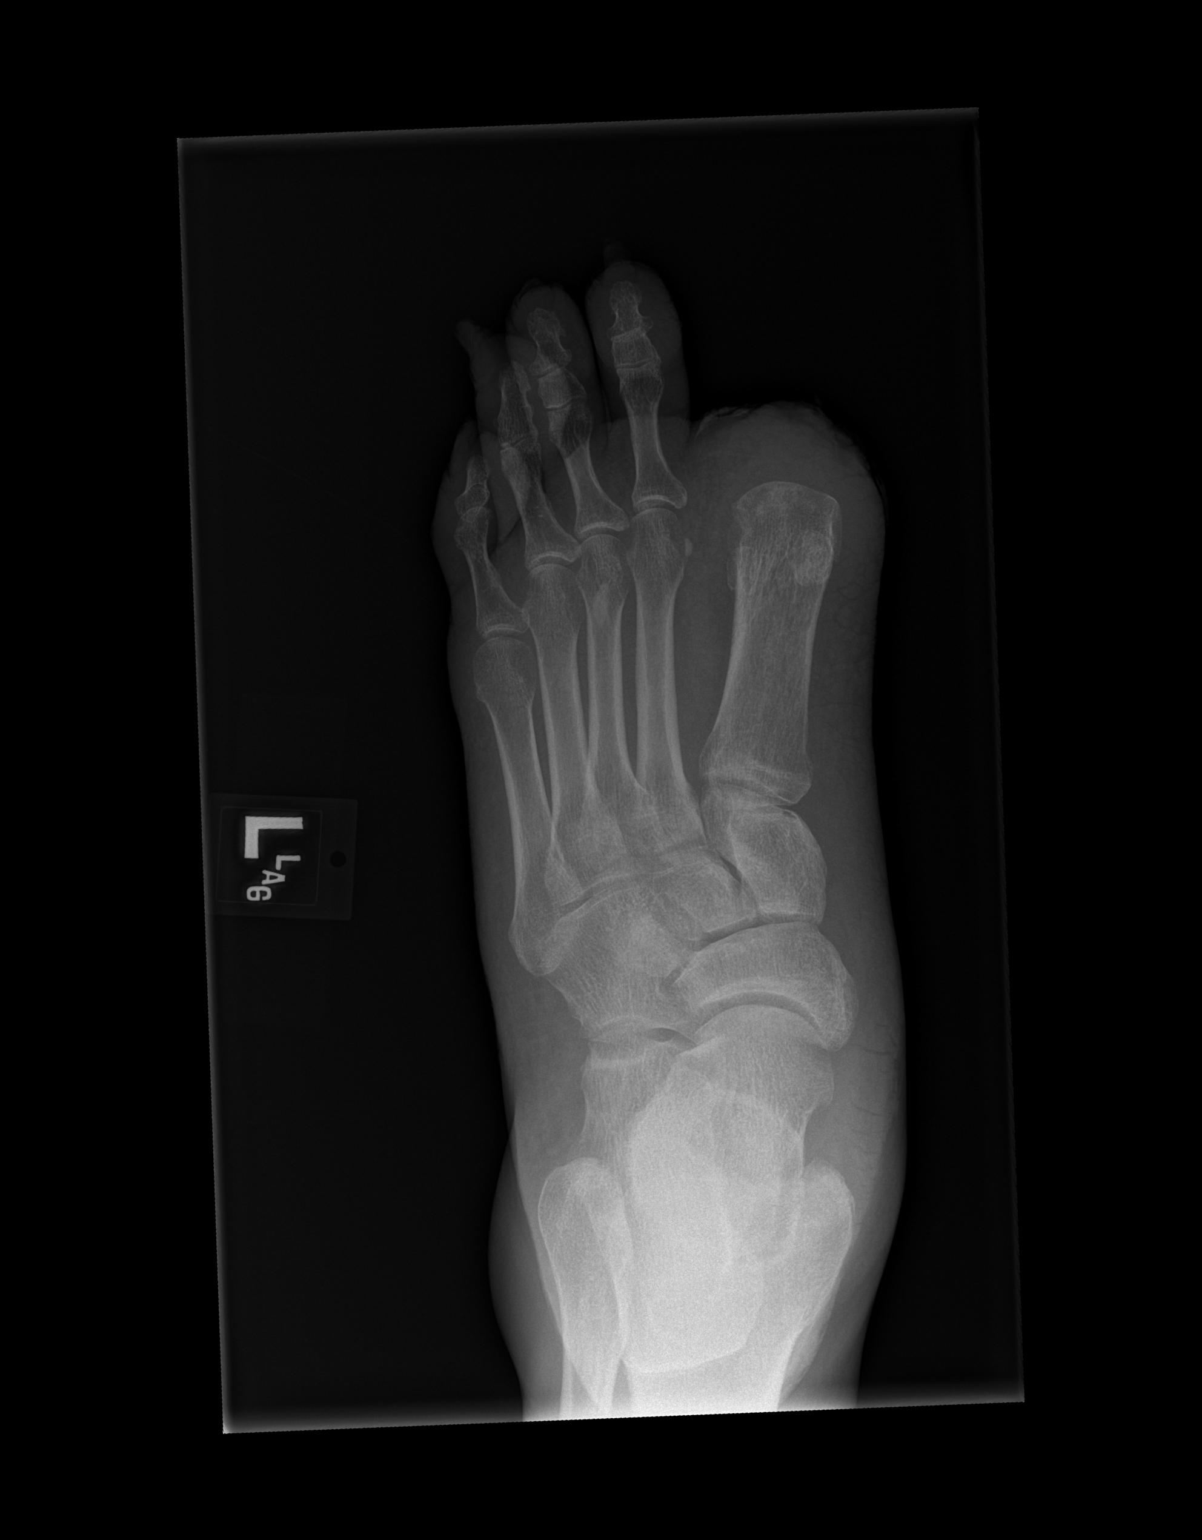

[x foot obl left]
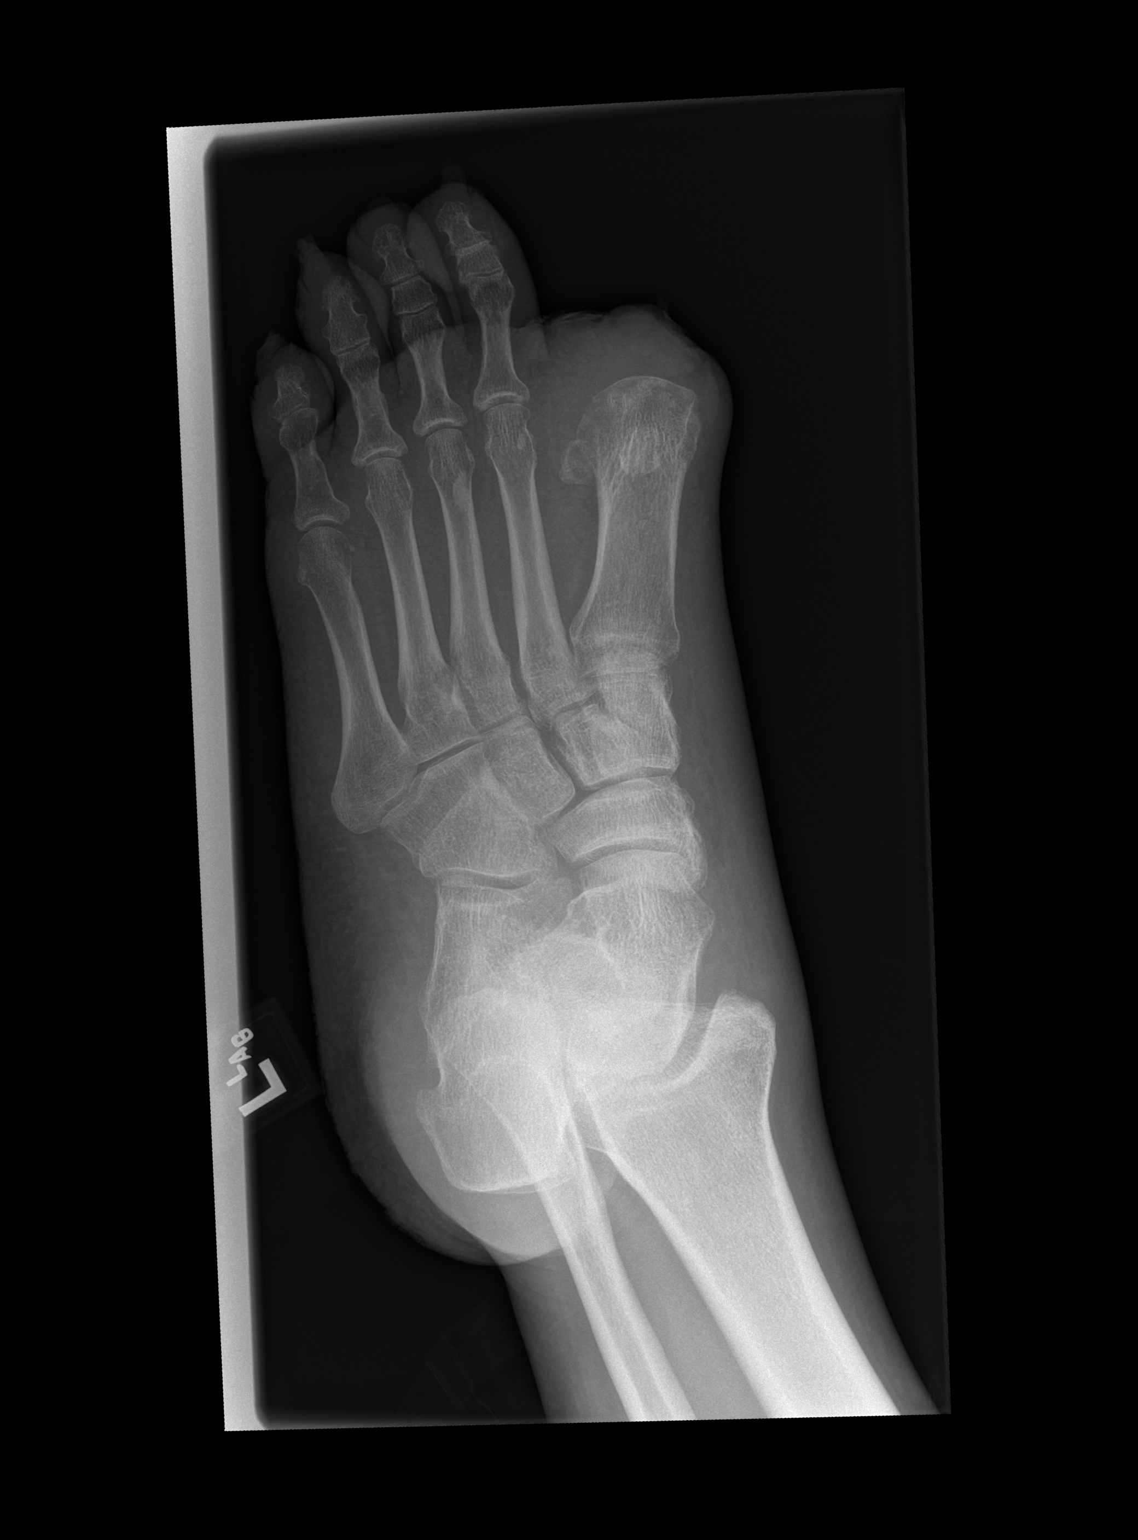

[x foot lat left]
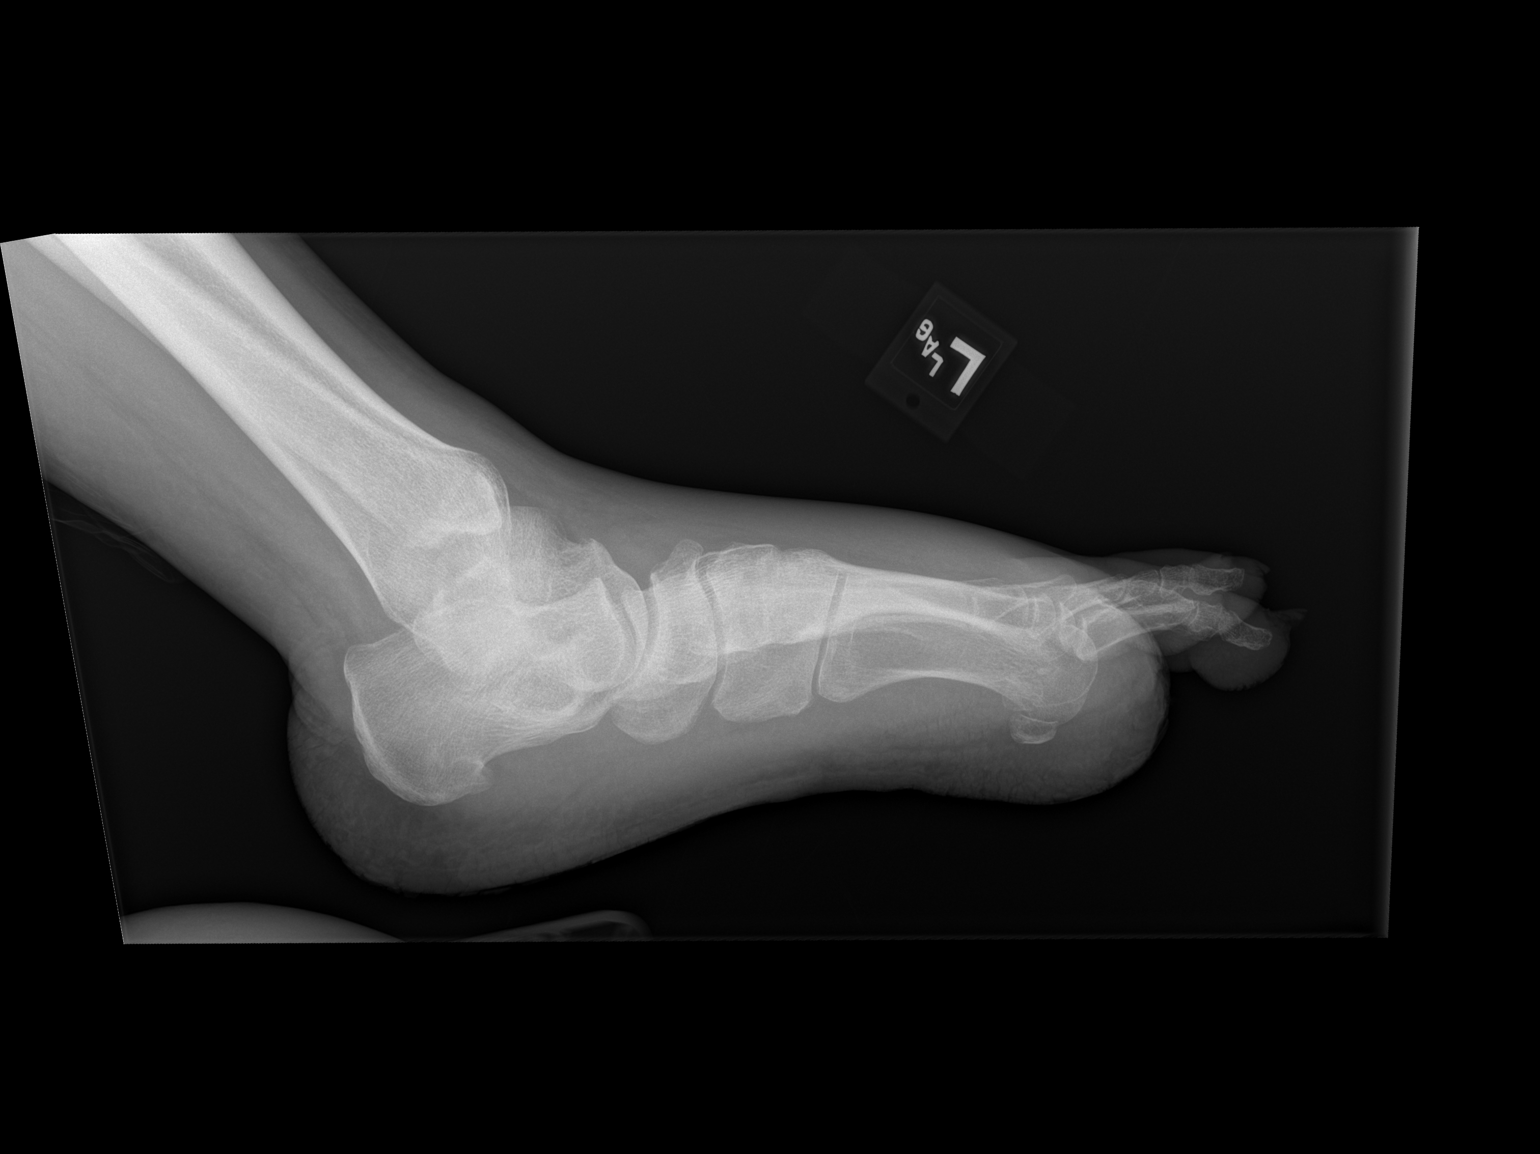

[3 of 3 positions shown; findings below may reference images not displayed]

FINDINGS: Right foot: Postsurgical changes from prior first ray amputation at
the level of the metatarsophalangeal joint. Interval removal the
overlying skin staples. Some mild residual soft tissue swelling is
noted at the level of the amputation. Some mild cutaneous
irregularity, correlate for ulceration or laceration. No soft tissue
gas or foreign body. No convincing radiographic sites of
osteomyelitis at this time. Stable degenerative changes in the mid
and hindfoot including plantar calcaneal spur.

Left foot: Postsurgical changes from prior first ray amputation at
the level of the metatarsophalangeal joint. Interval removal of
overlying skin staple seen previously. Some mild residual cutaneous
irregularity, correlate for postsurgical change, ulceration or
laceration. No soft tissue gas or foreign body is seen. No subjacent
areas of osseous erosion, destructive change or other suspicious
features of osteomyelitis are seen at this time. Degenerative
changes in the mid and hindfoot with plantar calcaneal spurring.
IMPRESSION: 1. Postsurgical changes from prior bilateral first ray amputation at
the level of the metatarsophalangeal joints.
2. Bilateral residual swelling at the site of the first ray
amputations with some overlying cutaneous irregularity, correlate
with visual inspection.
3. No convincing radiographic evidence of osteomyelitis at this time
however if there is persisting clinical concern MR imaging could be
obtained.
4. No soft tissue gas or foreign body.
5. Degenerative changes throughout the mid and hind feet with
bilateral plantar calcaneal spurs.

## 2021-02-19 IMAGING — CR DG FOOT COMPLETE 3+V*R*
5 series · 5 of 5 positions shown · non-contrast
Comparison: Radiograph [DATE]

CLINICAL DATA: Bilateral foot pain, swelling, bilateral first ray
amputations [DATE]

EXAM:
LEFT FOOT - COMPLETE 3+ VIEW; RIGHT FOOT COMPLETE - 3+ VIEW

[x foot ap right]
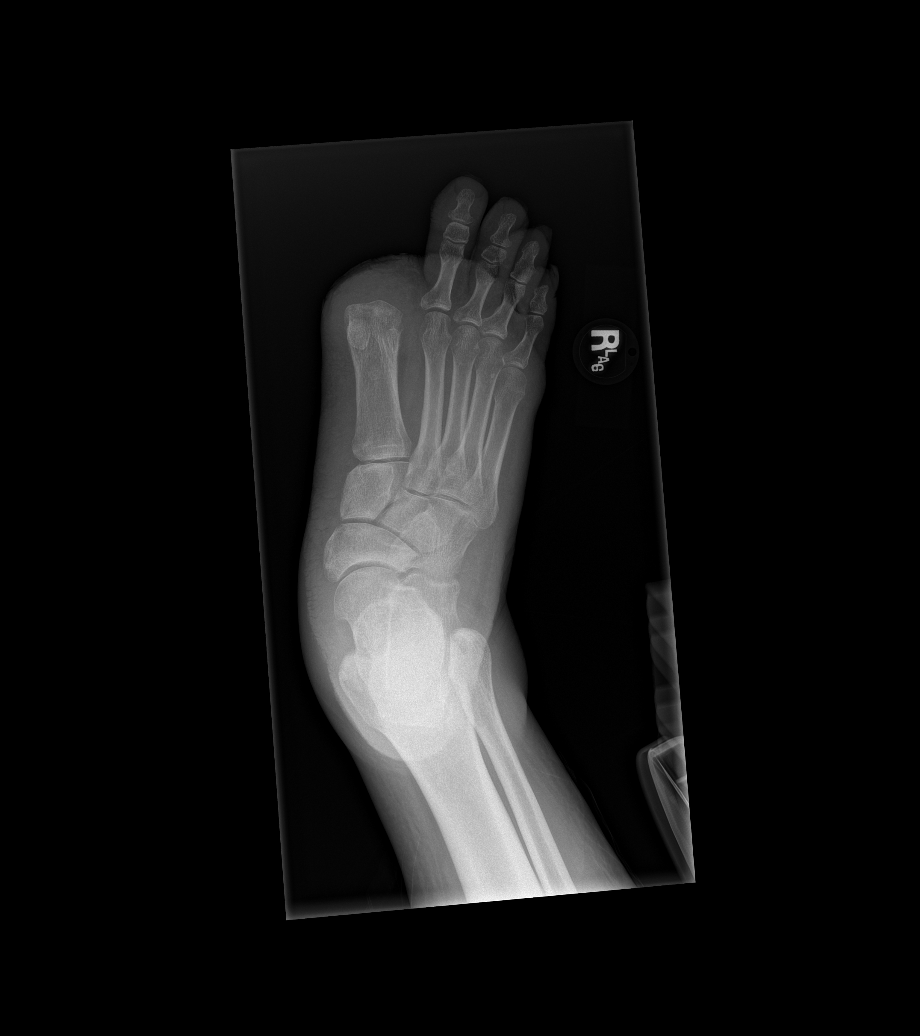

[x foot obl right]
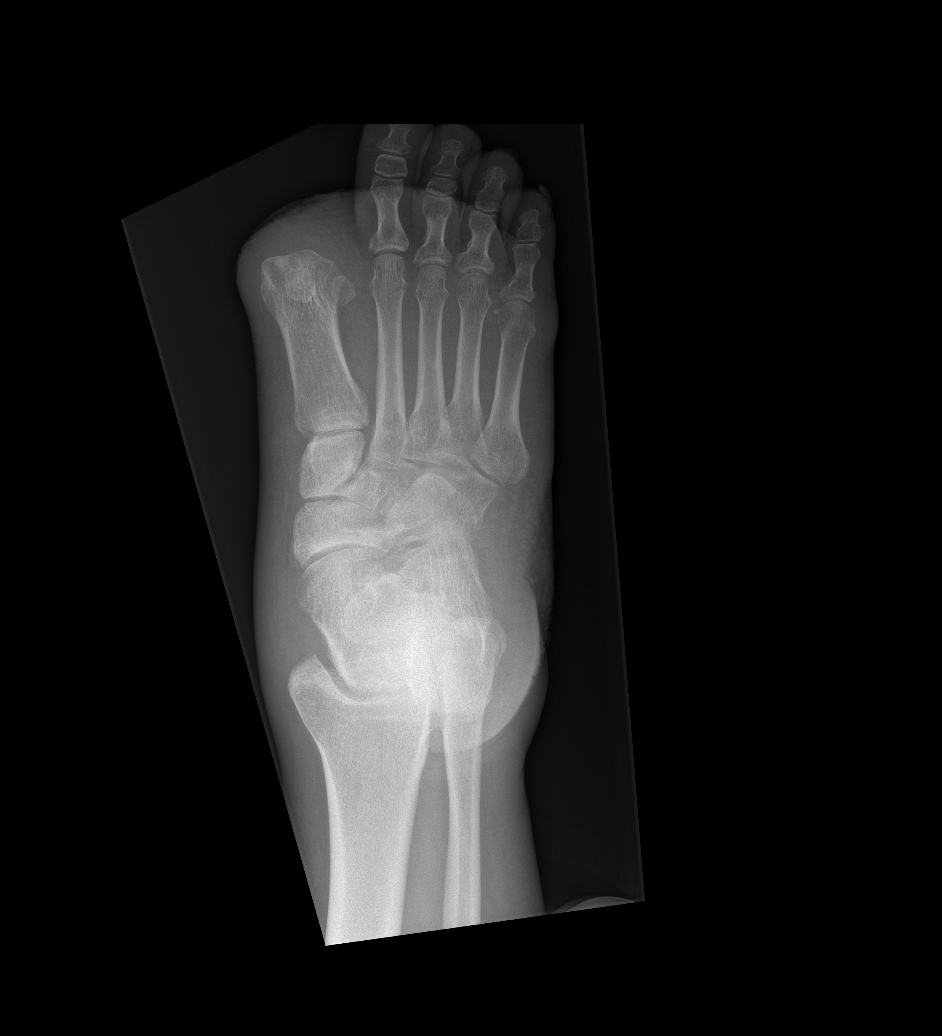

[x foot lat right (1 of 3)]
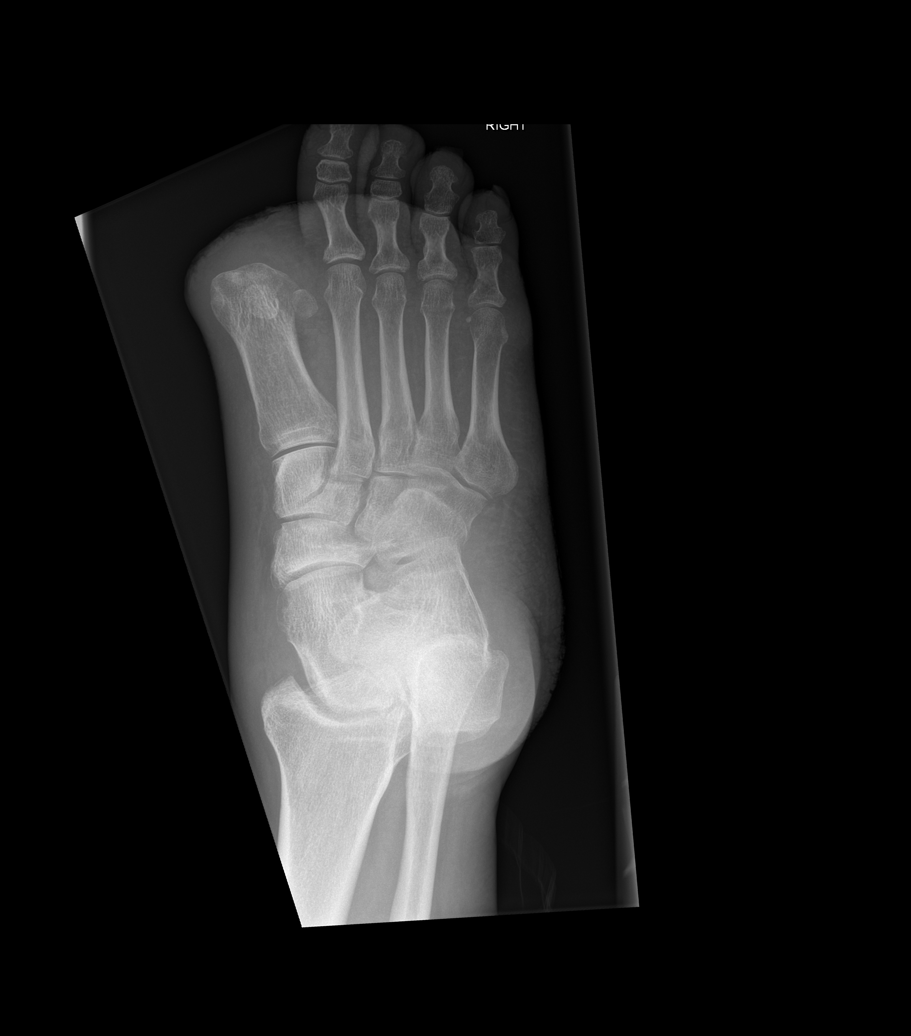

[x foot lat right (2 of 3)]
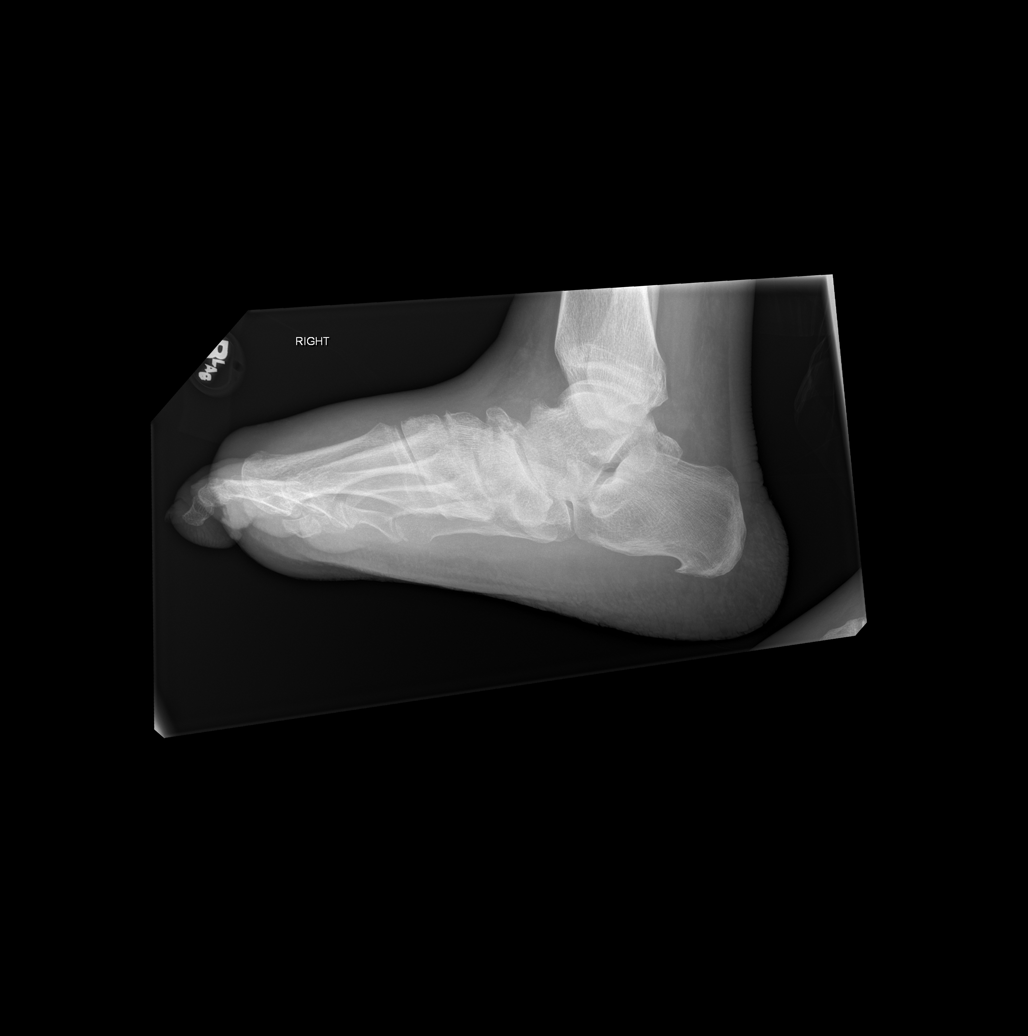

[x foot lat right (3 of 3)]
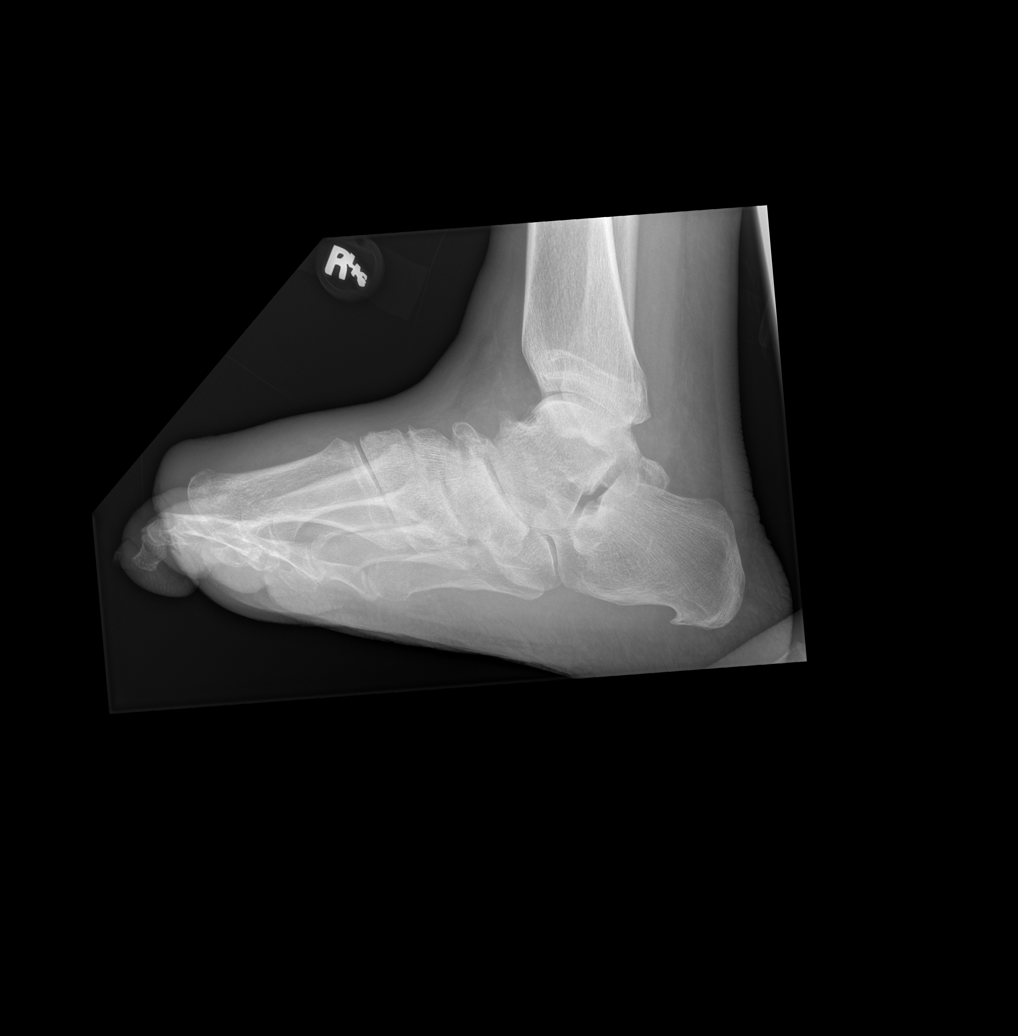

[5 of 5 positions shown; findings below may reference images not displayed]

FINDINGS: Right foot: Postsurgical changes from prior first ray amputation at
the level of the metatarsophalangeal joint. Interval removal the
overlying skin staples. Some mild residual soft tissue swelling is
noted at the level of the amputation. Some mild cutaneous
irregularity, correlate for ulceration or laceration. No soft tissue
gas or foreign body. No convincing radiographic sites of
osteomyelitis at this time. Stable degenerative changes in the mid
and hindfoot including plantar calcaneal spur.

Left foot: Postsurgical changes from prior first ray amputation at
the level of the metatarsophalangeal joint. Interval removal of
overlying skin staple seen previously. Some mild residual cutaneous
irregularity, correlate for postsurgical change, ulceration or
laceration. No soft tissue gas or foreign body is seen. No subjacent
areas of osseous erosion, destructive change or other suspicious
features of osteomyelitis are seen at this time. Degenerative
changes in the mid and hindfoot with plantar calcaneal spurring.
IMPRESSION: 1. Postsurgical changes from prior bilateral first ray amputation at
the level of the metatarsophalangeal joints.
2. Bilateral residual swelling at the site of the first ray
amputations with some overlying cutaneous irregularity, correlate
with visual inspection.
3. No convincing radiographic evidence of osteomyelitis at this time
however if there is persisting clinical concern MR imaging could be
obtained.
4. No soft tissue gas or foreign body.
5. Degenerative changes throughout the mid and hind feet with
bilateral plantar calcaneal spurs.

## 2021-02-19 NOTE — Discharge Instructions (Signed)
Follow-up with Dr. Samuella Cota in the clinic about your feet.  Can call and make an appointment.

## 2021-02-19 NOTE — ED Notes (Signed)
Pt given discharge instructions, pt stated he didn't care about them and just wanted a bus pass to leave. Pt given a bus pass and a pair of scrub pants to leave. Pt left independently in wheelchair.

## 2021-02-19 NOTE — ED Notes (Signed)
Patient provided ice cream, graham crackers, and milk.

## 2021-02-19 NOTE — ED Notes (Signed)
Patient transported to X-ray 

## 2021-02-19 NOTE — ED Provider Notes (Signed)
[facility]-EMERGENCY DEPT Provider Note   CSN: [id number] Arrival date & time: 02/18/21  1949     History Chief Complaint  Patient presents with  . Foot Pain    [name] is a 55 y.o. male.  The history is provided by the patient and medical records.    55 year old male with history of asthma, bipolar disorder, CHF, COPD, depression, diabetes, GERD, homelessness, schizoaffective disorder, presenting to the ED with bilateral foot pain.  Patient was admitted to the hospital in February 2022 due to diabetic foot infection of both of his great toes.  These were subsequently amputated and he was on IV antibiotics for several weeks due to osteomyelitis.  States he did have some follow-up and had his sutures taken out.  States initially he was doing ok but over the past few days he has had increased pain and swelling to both feet.  States he is not able to bear weight on his right foot at all, but is able to transfer with left leg.  He is generally wheel chair bound.  He states he is having difficulty caring for himself now but does not want to be in the hospital or some facility  Currently homeless and was picked up tonight from a park.  He reports he has had PT/OT but did not make much improvement with that overall.  Admits "there may be some cocaine in my urine, but that was from weeks ago".  Denies recent drug use.  States he is hungry and has not had any food today.  Past Medical History:  Diagnosis Date  . Asthma   . Bipolar 1 disorder (HCC)   . CHF (congestive heart failure) (HCC)   . COPD (chronic obstructive pulmonary disease) (HCC)   . Depression   . Diabetes mellitus without complication (HCC)   . GERD (gastroesophageal reflux disease)   . Homelessness   . Hypertension   . QT prolongation   . Schizoaffective disorder [facility])     Patient Active Problem List   Diagnosis Date Noted  . Skin ulcer of right great toe with necrosis of bone (HCC)   .  Encounter for assessment of healthcare decision-making capacity   . Pain, dental   . Dystonia   . Dysarthria   . Shock liver 01/04/2021  . Acute metabolic encephalopathy 01/04/2021  . CAD (coronary artery disease) 01/04/2021  . HTN (hypertension) 01/04/2021  . Bipolar I disorder (HCC) 01/04/2021  . Cocaine abuse (HCC) 01/04/2021  . Homelessness 01/04/2021  . HLD (hyperlipidemia) 01/04/2021  . Osteomyelitis of great toe (HCC)   . Ulcer of great toe, left, with necrosis of bone (HCC)   . Ulcer of great toe, right, with necrosis of muscle (HCC)   . Acute osteomyelitis of toe, left (HCC)   . Acute on chronic combined systolic and diastolic CHF (congestive heart failure) (HCC) 12/20/2020  . Respiratory failure (HCC)   . Ischemic stroke (HCC) 12/17/2020  . 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 Diabetic foot ulcer (HCC) 12/08/2020  . CHF (congestive heart failure) (HCC) 11/14/2020  . CHF exacerbation (HCC) 11/13/2020  . Hypokalemia   . Hyperlipidemia   . Acute respiratory failure with hypoxia (HCC) 10/26/2020  . COPD with acute exacerbation (HCC) 10/14/2020  . Acute on chronic combined systolic and diastolic CHF (congestive heart failure) (HCC) 10/02/2020  . Near syncope 07/21/2020  . Schizoaffective disorder (HCC) 06/24/2019  . SOB (shortness of breath)   . QT prolongation 05/25/2019  . Cocaine abuse (  HCC)   . Elevated troponin   . Homeless   . Suicidal ideation   . Bilateral foot pain 04/05/2019  . Housing situation unstable 03/28/2019  . Bilateral lower extremity edema   . Asthma 12/26/2016  . Tobacco use disorder 12/26/2016  . Schizophrenia (HCC) 12/26/2016  . Cocaine abuse with cocaine-induced mood disorder (HCC) 09/06/2016  . Alcohol use disorder, mild, abuse 07/31/2016  . Substance induced mood disorder (HCC) 07/30/2016  . Schizoaffective disorder, bipolar type (HCC) 05/31/2016  . Cocaine use disorder, severe, dependence (HCC) 05/31/2016  . Hypertension 05/31/2016     Past Surgical History:  Procedure Laterality Date  . 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 IR ANGIO INTRA EXTRACRAN SEL COM CAROTID INNOMINATE BILAT MOD SED  12/17/2020  . IR ANGIO VERTEBRAL SEL VERTEBRAL BILAT MOD SED  12/17/2020  . RADIOLOGY WITH ANESTHESIA N/A 12/17/2020   Procedure: IR WITH ANESTHESIA;  Surgeon: Radiologist, Medication, MD;  Location: [facility] OR;  Service: Radiology;  Laterality: N/A;       Family History  Problem Relation Age of Onset  . Drug abuse Mother   . Suicidality Mother     Social History   Tobacco Use  . Smoking status: Current Every Day Smoker    Years: 30.00    Types: Cigarettes  . Smokeless tobacco: Never Used  Vaping Use  . Vaping Use: Never used  Substance Use Topics  . Alcohol use: Yes    Comment: infrequently  . Drug use: Yes    Types: Cocaine, Marijuana, "Crack" cocaine    Home Medications Prior to Admission medications   Medication Sig Start Date End Date Taking? Authorizing Provider  albuterol (VENTOLIN HFA) 108 (90 Base) MCG/ACT inhaler Inhale 2 puffs into the lungs every 6 (six) hours as needed for wheezing or shortness of breath. 11/16/20   Pokhrel, Rebekah Chesterfield, MD  aspirin 81 MG chewable tablet Chew 1 tablet (81 mg total) by mouth daily. 02/03/21   Leatha Gilding, MD  atorvastatin (LIPITOR) 40 MG tablet Take 1 tablet (40 mg total) by mouth daily. 02/03/21   Leatha Gilding, MD  carvedilol (COREG) 3.125 MG tablet Take 1 tablet (3.125 mg total) by mouth 2 (two) times daily with a meal. 11/16/20   Pokhrel, Laxman, MD  cloNIDine (CATAPRES) 0.1 MG tablet Take 1 tablet (0.1 mg total) by mouth 3 (three) times daily. 11/16/20   Pokhrel, Rebekah Chesterfield, MD  clopidogrel (PLAVIX) 75 MG tablet Take 1 tablet (75 mg total) by mouth daily. 11/16/20   Pokhrel, Rebekah Chesterfield, MD  divalproex (DEPAKOTE ER) 500  MG 24 hr tablet Take 1 tablet (500 mg total) by mouth in the morning and at bedtime. Patient not taking: No sig reported 11/16/20   Pokhrel, Rebekah Chesterfield, MD  divalproex (DEPAKOTE ER) 500 MG 24 hr tablet Take 1 tablet (500 mg total) by mouth in the morning and at bedtime. 02/03/21   Leatha Gilding, MD  doxycycline (VIBRA-TABS) 100 MG tablet Take 1 tablet (100 mg total) by mouth every 12 (twelve) hours. 12/10/20   Jackelyn Poling, DO  furosemide (LASIX) 40 MG tablet Take 1 tablet (40 mg total) by mouth daily. Patient not taking: No sig reported 11/16/20   Pokhrel, Rebekah Chesterfield, MD  furosemide (LASIX) 40 MG tablet Take 1 tablet (40 mg total) by mouth daily. 02/03/21   Leatha Gilding, MD  gabapentin (NEURONTIN) 100 MG capsule Take 2 capsules (200 mg total) by mouth 2 (two) times daily. Patient not taking: No sig reported 11/16/20   Pokhrel, Rebekah ChesterfieldLaxman, MD  gabapentin (NEURONTIN) 300 MG capsule Take 2 capsules (600 mg total) by mouth 3 (three) times daily. 02/03/21   Leatha GildingGherghe, Costin M, MD  hydrALAZINE (APRESOLINE) 50 MG tablet Take 1 tablet (50 mg total) by mouth every 8 (eight) hours. 11/16/20   Pokhrel, Rebekah ChesterfieldLaxman, MD  isosorbide dinitrate (ISORDIL) 5 MG tablet Take 1 tablet (5 mg total) by mouth 3 (three) times daily. 02/03/21   Leatha GildingGherghe, Costin M, MD  isosorbide mononitrate (IMDUR) 30 MG 24 hr tablet Take 1 tablet (30 mg total) by mouth daily. Patient not taking: Reported on 02/12/2021 11/16/20 12/16/20  Joycelyn DasPokhrel, Laxman, MD  levETIRAcetam (KEPPRA) 500 MG tablet Take 1 tablet (500 mg total) by mouth 2 (two) times daily. 02/03/21   Leatha GildingGherghe, Costin M, MD  lisinopril (ZESTRIL) 20 MG tablet Take 1 tablet (20 mg total) by mouth daily. 12/10/20   Jackelyn PolingWelborn, Ryan, DO  magnesium oxide (MAG-OX) 400 (241.3 Mg) MG tablet Take 1 tablet (400 mg total) by mouth 2 (two) times daily. 02/03/21   Leatha GildingGherghe, Costin M, MD  predniSONE (DELTASONE) 10 MG tablet Take 1 tablet (10 mg total) by mouth daily with breakfast. 12/11/20   Atha StarksWelborn, Ryan, DO   sacubitril-valsartan (ENTRESTO) 24-26 MG Take 1 tablet by mouth 2 (two) times daily. 02/03/21   Leatha GildingGherghe, Costin M, MD  senna-docusate (SENOKOT-S) 8.6-50 MG tablet Take 1 tablet by mouth at bedtime as needed for mild constipation. 10/07/20   Marguerita MerlesSheikh, Omair Latif, DO  spironolactone (ALDACTONE) 25 MG tablet Take 0.5 tablets (12.5 mg total) by mouth daily. 01/15/21   Russella DarEllis, Allison L, NP  traZODone (DESYREL) 50 MG tablet Take 1 tablet (50 mg total) by mouth at bedtime. 11/16/20   Pokhrel, Rebekah ChesterfieldLaxman, MD  umeclidinium bromide (INCRUSE ELLIPTA) 62.5 MCG/INH AEPB Inhale 1 puff into the lungs daily. 11/16/20   Pokhrel, Rebekah ChesterfieldLaxman, MD    Allergies    Ibuprofen, Ibuprofen, Acetaminophen, Latex, Lyrica cr [pregabalin er], Peanut-containing drug products, and Tylenol [acetaminophen]  Review of Systems   Review of Systems  Musculoskeletal: Positive for arthralgias.  All other systems reviewed and are negative.   Physical Exam Updated Vital Signs BP 136/67   Pulse 90   Temp 98.1 F (36.7 C)   Resp 18   Ht 5\' 9"  (1.753 m)   Wt 86.2 kg   SpO2 100%   BMI 28.06 kg/m   Physical Exam Vitals and nursing note reviewed.  Constitutional:      Appearance: He is well-developed.  HENT:     Head: Normocephalic and atraumatic.  Eyes:     Conjunctiva/sclera: Conjunctivae normal.     Pupils: Pupils are equal, round, and reactive to light.  Cardiovascular:     Rate and Rhythm: Normal rate and regular rhythm.     Heart sounds: Normal heart sounds.  Pulmonary:     Effort: Pulmonary effort is normal.     Breath sounds: Normal breath sounds.  Abdominal:     General: Bowel sounds are normal.     Palpations: Abdomen is soft.  Musculoskeletal:        General: Normal range of motion.     Cervical back: Normal range of motion.     Comments: Pes planus deformity of right foot, chronic in nature, very dry, cracked skin of both feet, nails are overgrown and discolored Right and left great toe's  have both been  amputated, surgical scars appear clean without drainage noted, some mild soft tissue swelling of the dorsal feet  Skin:    General: Skin is warm and dry.  Neurological:     Mental Status: He is alert and oriented to person, place, and time.     Marland Kitchen right    ^^ left   ED Results / Procedures / Treatments   Labs (all labs ordered are listed, but only abnormal results are displayed) Labs Reviewed  CBC WITH DIFFERENTIAL/PLATELET - Abnormal; Notable for the following components:      Result Value   RDW 16.5 (*)    All other components within normal limits  CBG MONITORING, ED - Abnormal; Notable for the following components:   Glucose-Capillary 68 (*)    All other components within normal limits  BASIC METABOLIC PANEL    EKG None  Radiology DG Foot Complete Left  Result Date: 02/19/2021 CLINICAL DATA:  Bilateral foot pain, swelling, bilateral first ray amputations 01/02/2021 EXAM: LEFT FOOT - COMPLETE 3+ VIEW; RIGHT FOOT COMPLETE - 3+ VIEW COMPARISON:  Radiograph 01/02/2021 FINDINGS: Right foot: Postsurgical changes from prior first ray amputation at the level of the metatarsophalangeal joint. Interval removal the overlying skin staples. Some mild residual soft tissue swelling is noted at the level of the amputation. Some mild cutaneous irregularity, correlate for ulceration or laceration. No soft tissue gas or foreign body. No convincing radiographic sites of osteomyelitis at this time. Stable degenerative changes in the mid and hindfoot including plantar calcaneal spur. Left foot: Postsurgical changes from prior first ray amputation at the level of the metatarsophalangeal joint. Interval removal of overlying skin staple seen previously. Some mild residual cutaneous irregularity, correlate for postsurgical change, ulceration or laceration. No soft tissue gas or foreign body is seen. No subjacent areas of osseous erosion, destructive change or other suspicious features of osteomyelitis  are seen at this time. Degenerative changes in the mid and hindfoot with plantar calcaneal spurring. IMPRESSION: 1. Postsurgical changes from prior bilateral first ray amputation at the level of the metatarsophalangeal joints. 2. Bilateral residual swelling at the site of the first ray amputations with some overlying cutaneous irregularity, correlate with visual inspection. 3. No convincing radiographic evidence of osteomyelitis at this time however if there is persisting clinical concern MR imaging could be obtained. 4. No soft tissue gas or foreign body. 5. Degenerative changes throughout the mid and hind feet with bilateral plantar calcaneal spurs. Electronically Signed   By: Kreg Shropshire M.D.   On: 02/19/2021 03:23   DG Foot Complete Right  Result Date: 02/19/2021 CLINICAL DATA:  Bilateral foot pain, swelling, bilateral first ray amputations 01/02/2021 EXAM: LEFT FOOT - COMPLETE 3+ VIEW; RIGHT FOOT COMPLETE - 3+ VIEW COMPARISON:  Radiograph 01/02/2021 FINDINGS: Right foot: Postsurgical changes from prior first ray amputation at the level of the metatarsophalangeal joint. Interval removal the overlying skin staples. Some mild residual soft tissue swelling is noted at the level of the amputation. Some mild cutaneous irregularity, correlate for ulceration or laceration. No soft tissue gas or foreign body. No convincing radiographic sites of osteomyelitis at this time. Stable degenerative changes in the mid and hindfoot including plantar calcaneal spur. Left foot: Postsurgical changes from prior first ray amputation at the level of the metatarsophalangeal joint. Interval removal of overlying skin staple seen previously. Some mild residual cutaneous irregularity, correlate for postsurgical change, ulceration or laceration. No soft tissue gas or foreign body is seen. No subjacent areas of osseous erosion,  destructive change or other suspicious features of osteomyelitis are seen at this time. Degenerative changes  in the mid and hindfoot with plantar calcaneal spurring. IMPRESSION: 1. Postsurgical changes from prior bilateral first ray amputation at the level of the metatarsophalangeal joints. 2. Bilateral residual swelling at the site of the first ray amputations with some overlying cutaneous irregularity, correlate with visual inspection. 3. No convincing radiographic evidence of osteomyelitis at this time however if there is persisting clinical concern MR imaging could be obtained. 4. No soft tissue gas or foreign body. 5. Degenerative changes throughout the mid and hind feet with bilateral plantar calcaneal spurs. Electronically Signed   By: Kreg Shropshire M.D.   On: 02/19/2021 03:23    Procedures Procedures   Medications Ordered in ED Medications - No data to display  ED Course  I have reviewed the triage vital signs and the nursing notes.  Pertinent labs & imaging results that were available during my care of the patient were reviewed by me and considered in my medical decision making (see chart for details).    MDM Rules/Calculators/A&P  55 year old male presenting to the ED with bilateral foot pain.  He is about 1 month out status post bilateral great toe amputations by Dr. Samuella Cota secondary to diabetic infection and osteomyelitis.  He denies any new wounds or drainage, just states his feet are "hurting".  He is primarily wheelchair-bound, able to stand and transfer with his left leg but not the right.  He denies any new trauma to the feet.  No fever or chills.  On exam, afebrile, nontoxic in appearance.  He appears at his baseline compared with prior visits.  His feet both have dry, cracked skin including along the amputation sites but there are no open wounds or ulcerations.  No bleeding or active drainage.  Toenails are discolored and overgrown but this seems to be a chronic issue.  Given his history of osteomyelitis and recent amputation, x-rays were obtained which did not reveal any gas or other  acute findings currently.  His labs are reassuring with normal white count.  His glucose here is actually normal.  He was given food and drink here and has been resting comfortably.  At this time, no emergent condition seem to warrant admission.  Patient is currently homeless.  He has had multiple social work and PT/OT consultations, most of which resulted in him cursing at staff and refusing their assessment.  Feel that patient can follow back up with his podiatrist for ongoing needs.  He may return here for any new/acute changes.  Final Clinical Impression(s) / ED Diagnoses Final diagnoses:  Bilateral foot pain    Rx / DC Orders ED Discharge Orders    None       Garlon Hatchet, PA-C 02/19/21 0540    Nira Conn, MD 02/20/21 [id number]

## 2021-03-02 ENCOUNTER — Emergency Department (HOSPITAL_COMMUNITY): Payer: Medicaid Other

## 2021-03-02 ENCOUNTER — Encounter (HOSPITAL_COMMUNITY): Payer: Self-pay | Admitting: Emergency Medicine

## 2021-03-02 ENCOUNTER — Inpatient Hospital Stay (HOSPITAL_COMMUNITY)
Admission: EM | Admit: 2021-03-02 | Discharge: 2021-03-12 | DRG: 291 | Disposition: A | Discharge status: Skilled Nursing Facility | Payer: Medicaid Other | Attending: Internal Medicine | Admitting: Internal Medicine

## 2021-03-02 ENCOUNTER — Other Ambulatory Visit: Payer: Self-pay

## 2021-03-02 DIAGNOSIS — Z79899 Other long term (current) drug therapy: Secondary | ICD-10-CM

## 2021-03-02 DIAGNOSIS — R609 Edema, unspecified: Secondary | ICD-10-CM

## 2021-03-02 DIAGNOSIS — F17210 Nicotine dependence, cigarettes, uncomplicated: Secondary | ICD-10-CM | POA: Diagnosis present

## 2021-03-02 DIAGNOSIS — Z813 Family history of other psychoactive substance abuse and dependence: Secondary | ICD-10-CM

## 2021-03-02 DIAGNOSIS — K219 Gastro-esophageal reflux disease without esophagitis: Secondary | ICD-10-CM | POA: Diagnosis present

## 2021-03-02 DIAGNOSIS — I10 Essential (primary) hypertension: Secondary | ICD-10-CM | POA: Diagnosis present

## 2021-03-02 DIAGNOSIS — Z89411 Acquired absence of right great toe: Secondary | ICD-10-CM

## 2021-03-02 DIAGNOSIS — Z9119 Patient's noncompliance with other medical treatment and regimen: Secondary | ICD-10-CM

## 2021-03-02 DIAGNOSIS — Z91040 Latex allergy status: Secondary | ICD-10-CM

## 2021-03-02 DIAGNOSIS — R2243 Localized swelling, mass and lump, lower limb, bilateral: Secondary | ICD-10-CM

## 2021-03-02 DIAGNOSIS — R4586 Emotional lability: Secondary | ICD-10-CM | POA: Diagnosis present

## 2021-03-02 DIAGNOSIS — I5043 Acute on chronic combined systolic (congestive) and diastolic (congestive) heart failure: Secondary | ICD-10-CM | POA: Diagnosis present

## 2021-03-02 DIAGNOSIS — L299 Pruritus, unspecified: Secondary | ICD-10-CM | POA: Diagnosis present

## 2021-03-02 DIAGNOSIS — I5042 Chronic combined systolic (congestive) and diastolic (congestive) heart failure: Secondary | ICD-10-CM | POA: Diagnosis present

## 2021-03-02 DIAGNOSIS — I248 Other forms of acute ischemic heart disease: Secondary | ICD-10-CM | POA: Diagnosis present

## 2021-03-02 DIAGNOSIS — I69322 Dysarthria following cerebral infarction: Secondary | ICD-10-CM

## 2021-03-02 DIAGNOSIS — D709 Neutropenia, unspecified: Secondary | ICD-10-CM | POA: Diagnosis present

## 2021-03-02 DIAGNOSIS — I2510 Atherosclerotic heart disease of native coronary artery without angina pectoris: Secondary | ICD-10-CM | POA: Diagnosis present

## 2021-03-02 DIAGNOSIS — Z91010 Allergy to peanuts: Secondary | ICD-10-CM

## 2021-03-02 DIAGNOSIS — Z89412 Acquired absence of left great toe: Secondary | ICD-10-CM

## 2021-03-02 DIAGNOSIS — Z6828 Body mass index (BMI) 28.0-28.9, adult: Secondary | ICD-10-CM

## 2021-03-02 DIAGNOSIS — Z20822 Contact with and (suspected) exposure to covid-19: Secondary | ICD-10-CM | POA: Diagnosis present

## 2021-03-02 DIAGNOSIS — J9811 Atelectasis: Secondary | ICD-10-CM | POA: Diagnosis present

## 2021-03-02 DIAGNOSIS — Z7982 Long term (current) use of aspirin: Secondary | ICD-10-CM

## 2021-03-02 DIAGNOSIS — M79604 Pain in right leg: Secondary | ICD-10-CM | POA: Diagnosis present

## 2021-03-02 DIAGNOSIS — Z8673 Personal history of transient ischemic attack (TIA), and cerebral infarction without residual deficits: Secondary | ICD-10-CM

## 2021-03-02 DIAGNOSIS — Z5900 Homelessness unspecified: Secondary | ICD-10-CM

## 2021-03-02 DIAGNOSIS — F250 Schizoaffective disorder, bipolar type: Secondary | ICD-10-CM | POA: Diagnosis present

## 2021-03-02 DIAGNOSIS — Z7952 Long term (current) use of systemic steroids: Secondary | ICD-10-CM

## 2021-03-02 DIAGNOSIS — E785 Hyperlipidemia, unspecified: Secondary | ICD-10-CM | POA: Diagnosis present

## 2021-03-02 DIAGNOSIS — R4587 Impulsiveness: Secondary | ICD-10-CM | POA: Diagnosis present

## 2021-03-02 DIAGNOSIS — F1990 Other psychoactive substance use, unspecified, uncomplicated: Secondary | ICD-10-CM

## 2021-03-02 DIAGNOSIS — J441 Chronic obstructive pulmonary disease with (acute) exacerbation: Secondary | ICD-10-CM | POA: Diagnosis present

## 2021-03-02 DIAGNOSIS — Z7902 Long term (current) use of antithrombotics/antiplatelets: Secondary | ICD-10-CM

## 2021-03-02 DIAGNOSIS — E1142 Type 2 diabetes mellitus with diabetic polyneuropathy: Secondary | ICD-10-CM | POA: Diagnosis present

## 2021-03-02 DIAGNOSIS — F1910 Other psychoactive substance abuse, uncomplicated: Secondary | ICD-10-CM

## 2021-03-02 DIAGNOSIS — F1414 Cocaine abuse with cocaine-induced mood disorder: Secondary | ICD-10-CM | POA: Diagnosis present

## 2021-03-02 DIAGNOSIS — M79605 Pain in left leg: Secondary | ICD-10-CM | POA: Diagnosis present

## 2021-03-02 DIAGNOSIS — I110 Hypertensive heart disease with heart failure: Principal | ICD-10-CM | POA: Diagnosis present

## 2021-03-02 DIAGNOSIS — M79671 Pain in right foot: Secondary | ICD-10-CM

## 2021-03-02 DIAGNOSIS — E663 Overweight: Secondary | ICD-10-CM | POA: Diagnosis present

## 2021-03-02 LAB — COMPREHENSIVE METABOLIC PANEL
ALT: 11 U/L (ref 0–44)
AST: 19 U/L (ref 15–41)
Albumin: 3.6 g/dL (ref 3.5–5.0)
Alkaline Phosphatase: 78 U/L (ref 38–126)
Anion gap: 10 (ref 5–15)
BUN: 14 mg/dL (ref 6–20)
CO2: 26 mmol/L (ref 22–32)
Calcium: 8.8 mg/dL — ABNORMAL LOW (ref 8.9–10.3)
Chloride: 100 mmol/L (ref 98–111)
Creatinine, Ser: 1.11 mg/dL (ref 0.61–1.24)
GFR, Estimated: >60 mL/min (ref >60)
Glucose, Bld: 81 mg/dL (ref 70–99)
Potassium: 3.7 mmol/L (ref 3.5–5.1)
Sodium: 136 mmol/L (ref 135–145)
Total Bilirubin: 1 mg/dL (ref 0.3–1.2)
Total Protein: 7.3 g/dL (ref 6.5–8.1)

## 2021-03-02 LAB — CBC WITH DIFFERENTIAL/PLATELET
Abs Immature Granulocytes: 0.01 10*3/uL (ref 0.00–0.07)
Basophils Absolute: 0 10*3/uL (ref 0.0–0.1)
Basophils Relative: 1 %
Eosinophils Absolute: 0 10*3/uL (ref 0.0–0.5)
Eosinophils Relative: 1 %
HCT: 43.6 % (ref 39.0–52.0)
Hemoglobin: 14.3 g/dL (ref 13.0–17.0)
Immature Granulocytes: 0 %
Lymphocytes Relative: 37 %
Lymphs Abs: 1.7 10*3/uL (ref 0.7–4.0)
MCH: 31.2 pg (ref 26.0–34.0)
MCHC: 32.8 g/dL (ref 30.0–36.0)
MCV: 95 fL (ref 80.0–100.0)
Monocytes Absolute: 0.6 10*3/uL (ref 0.1–1.0)
Monocytes Relative: 14 %
Neutro Abs: 2.1 10*3/uL (ref 1.7–7.7)
Neutrophils Relative %: 47 %
Platelets: 291 10*3/uL (ref 150–400)
RBC: 4.59 MIL/uL (ref 4.22–5.81)
RDW: 17.9 % — ABNORMAL HIGH (ref 11.5–15.5)
WBC: 4.5 10*3/uL (ref 4.0–10.5)
nRBC: 0 % (ref 0.0–0.2)

## 2021-03-02 LAB — TROPONIN I (HIGH SENSITIVITY)
Troponin I (High Sensitivity): 36 ng/L — ABNORMAL HIGH (ref <18)
Troponin I (High Sensitivity): 27 ng/L — ABNORMAL HIGH (ref <18)

## 2021-03-02 LAB — D-DIMER, QUANTITATIVE: D-Dimer, Quant: 0.69 ug/mL-FEU — ABNORMAL HIGH (ref 0.00–0.50)

## 2021-03-02 LAB — RESP PANEL BY RT-PCR (FLU A&B, COVID) ARPGX2
Influenza A by PCR: NEGATIVE
Influenza B by PCR: NEGATIVE
SARS Coronavirus 2 by RT PCR: NEGATIVE

## 2021-03-02 LAB — BRAIN NATRIURETIC PEPTIDE: B Natriuretic Peptide: 424.6 pg/mL — ABNORMAL HIGH (ref 0.0–100.0)

## 2021-03-02 IMAGING — DX DG CHEST 1V PORT
1 series · 1 of 1 positions shown · non-contrast
Comparison: [DATE] [DATE], [DATE].  [DATE] [DATE], [DATE]

CLINICAL DATA: Bilateral leg pain and swelling.  Productive cough.

EXAM:
PORTABLE CHEST 1 VIEW

[chest ap]
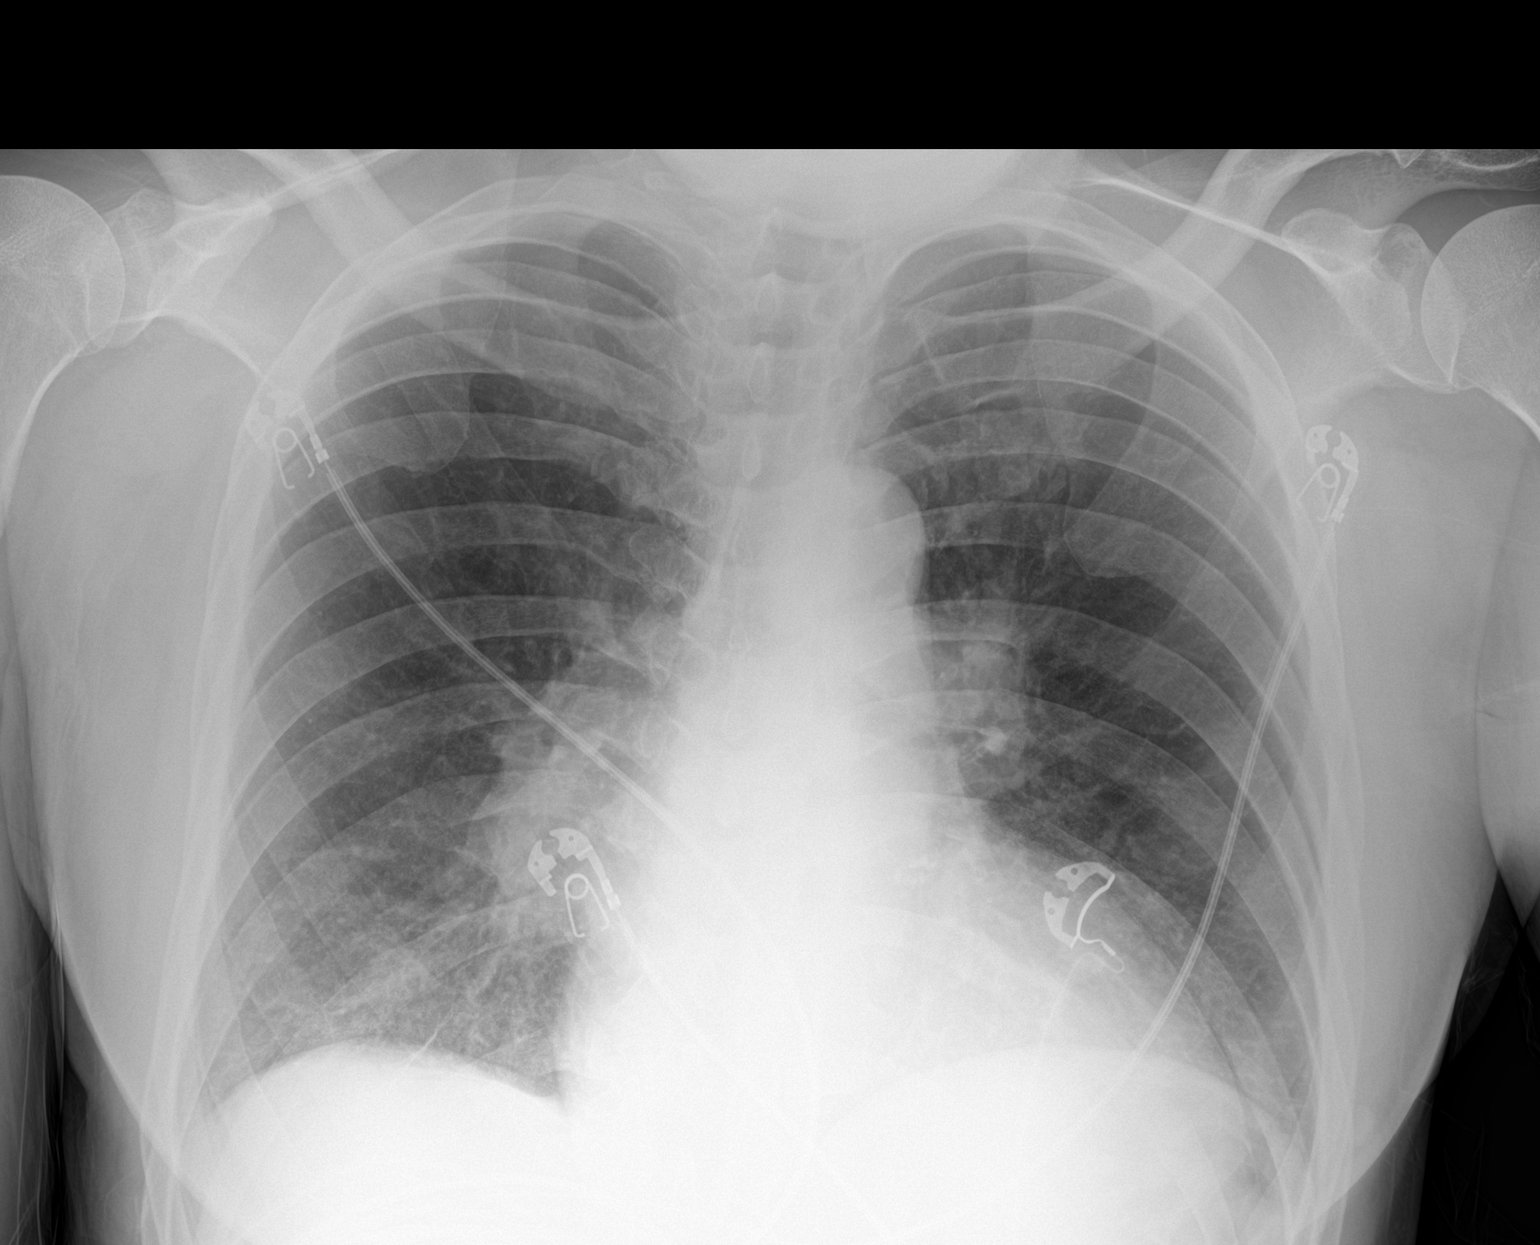

[1 of 1 positions shown; findings below may reference images not displayed]

FINDINGS: There is mild prominence the hila, similar since [DATE].
Cardiomegaly is stable. The mediastinum is unchanged. No
pneumothorax. No nodules or masses. Mild opacity in the right base
is favored represent atelectasis with developing infiltrate
considered less likely. No overt edema.
IMPRESSION: 1. Hilar prominence is similar since comparison studies and could be
technical in nature given portable imaging and normal hilar
structures on the [DATE] PA and lateral chest x-ray. A PA
and lateral chest x-ray could better evaluate.
2. Stable cardiomegaly.
3. Mild opacity in the right base is favored represent atelectasis.
Early developing infiltrate considered less likely.
4. No overt edema.

## 2021-03-02 IMAGING — CT CT ANGIO CHEST
2 of 6 series · 18 of 36 positions shown · IV contrast (omnipaque)
Comparison: Chest radiograph dated [DATE].

CLINICAL DATA: 54-year-old male with positive D-dimer. Concern for
pulmonary embolism.

EXAM:
CT ANGIOGRAPHY CHEST WITH CONTRAST
TECHNIQUE: Multidetector CT imaging of the chest was performed using the
standard protocol during bolus administration of intravenous
contrast. Multiplanar CT image reconstructions and MIPs were
obtained to evaluate the vascular anatomy.
CONTRAST:  100mL OMNIPAQUE IOHEXOL 350 MG/ML SOLN

[Series 5: thins · axial · 0.69mm/px · z∈[+1527,+1778]mm · 17 of 283 slices shown]
[im 16/283  lung]
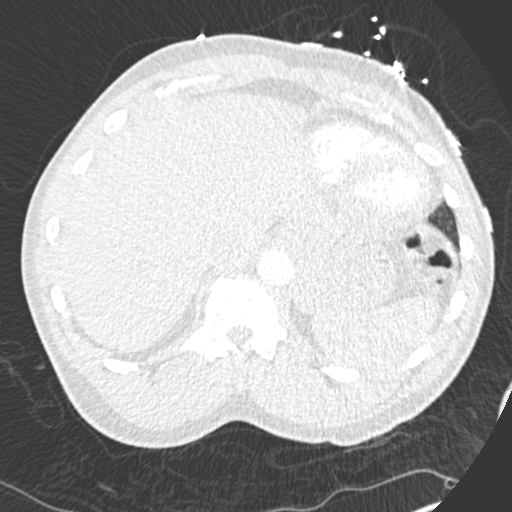
[im 32/283  mediastinal]
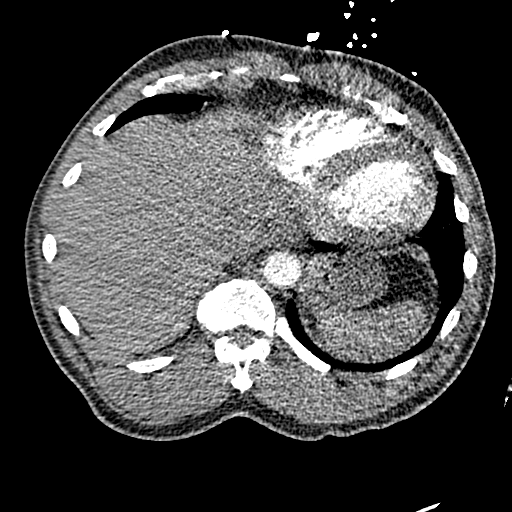
[im 48/283  lung]
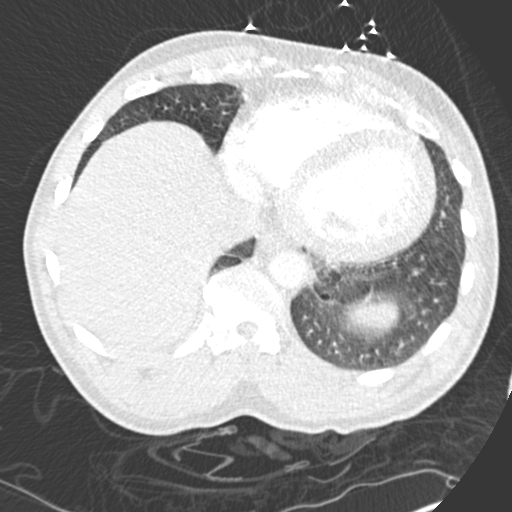
[im 63/283  mediastinal]
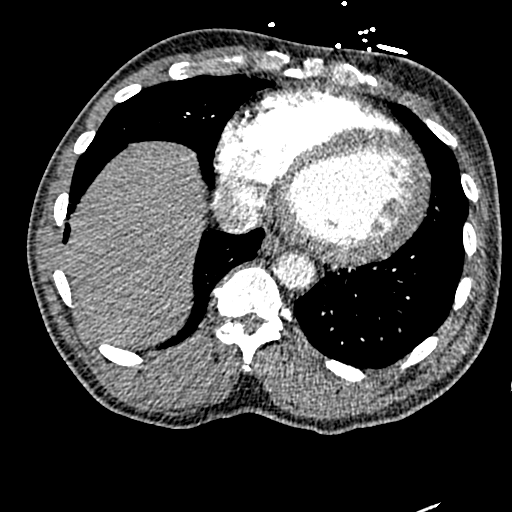
[im 79/283  lung]
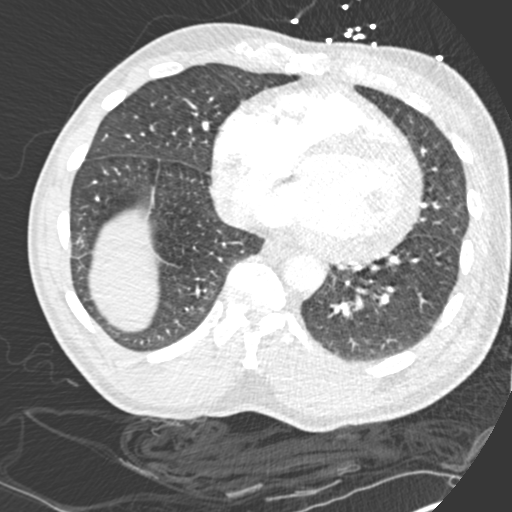
[im 95/283  mediastinal]
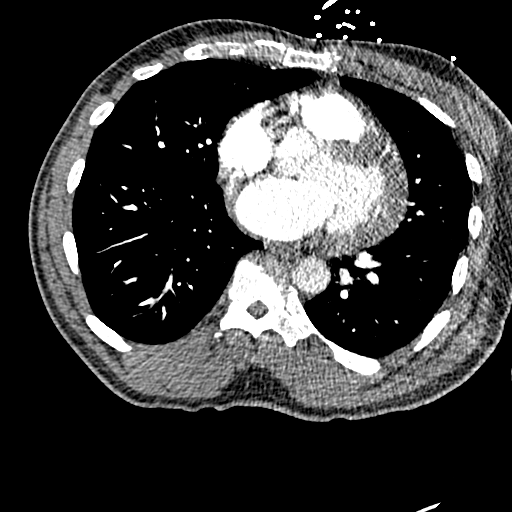
[im 110/283  lung]
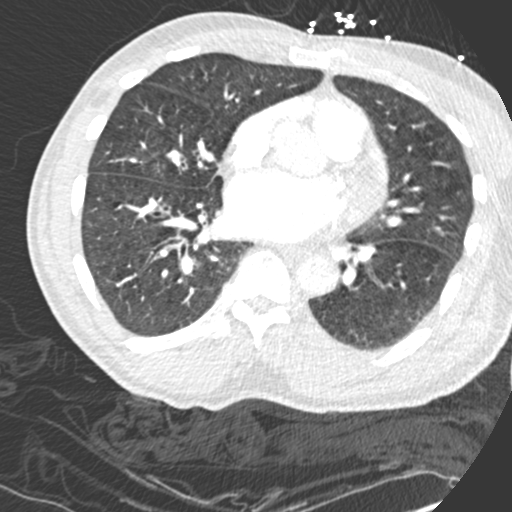
[im 126/283  mediastinal]
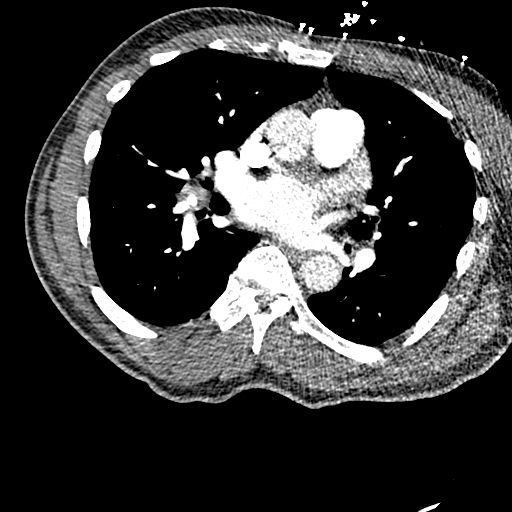
[im 142/283  lung]
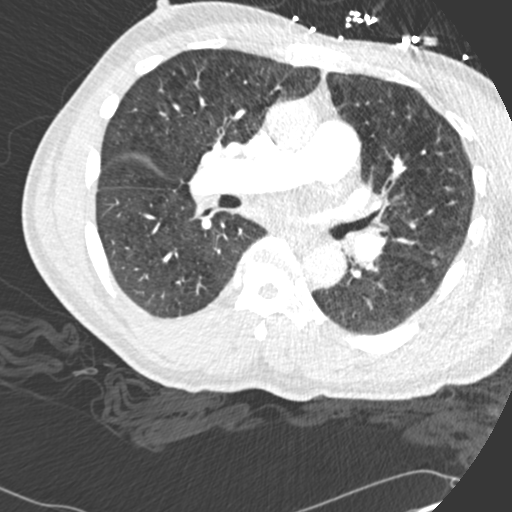
[im 157/283  mediastinal]
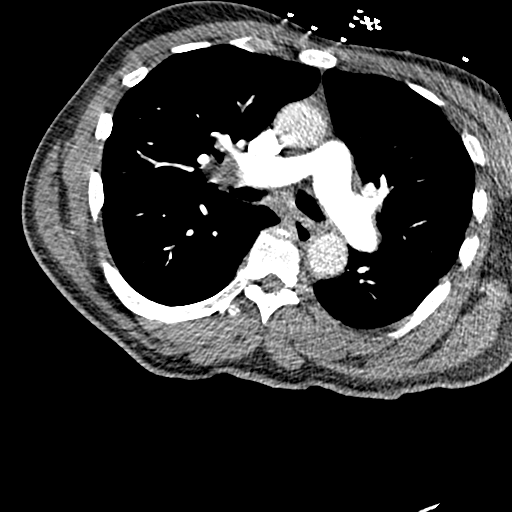
[im 173/283  lung]
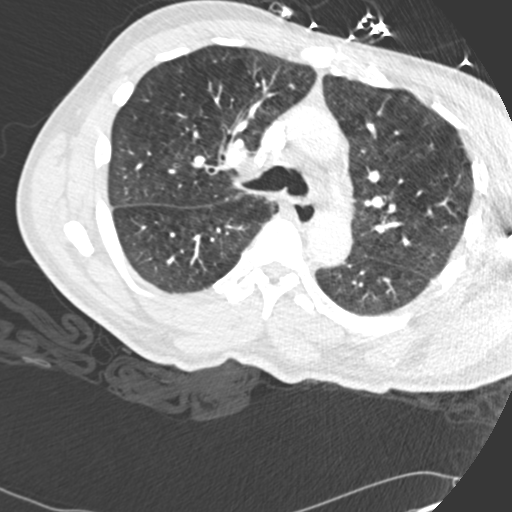
[im 189/283  mediastinal]
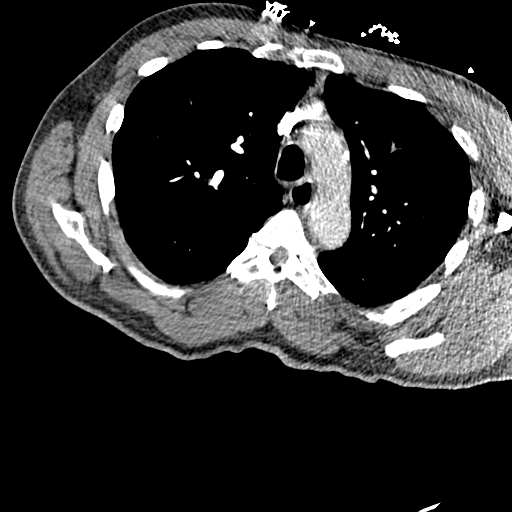
[im 204/283  lung]
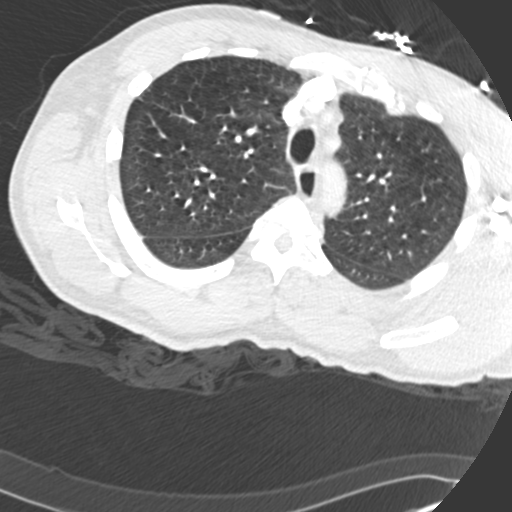
[im 220/283  mediastinal]
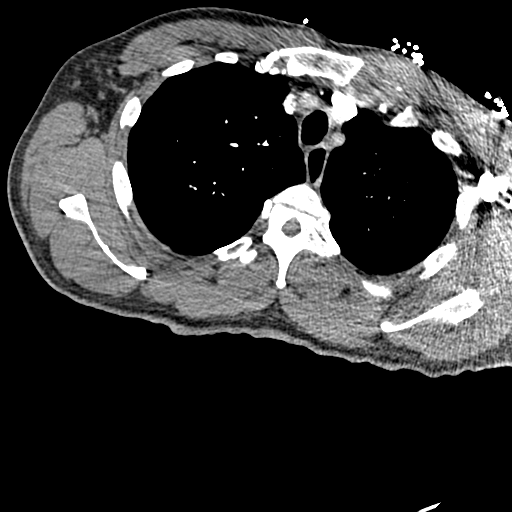
[im 236/283  lung]
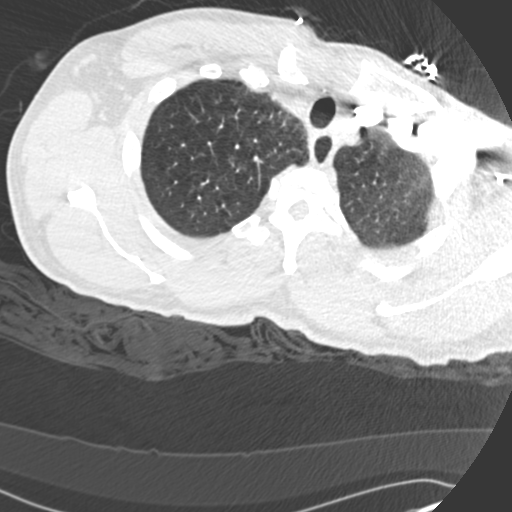
[im 251/283  mediastinal]
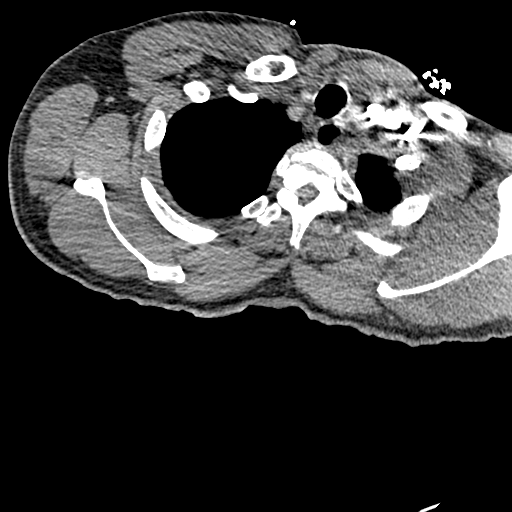
[im 267/283  lung]
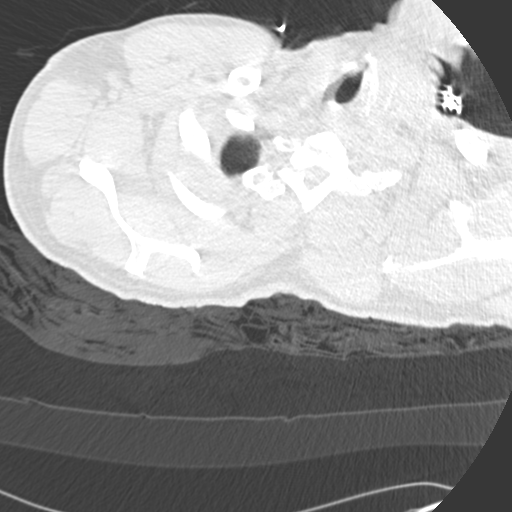

[Series 10: coronal mpr · coronal · 0.66mm/px · 1 of 151 slices shown]
[im 76/151  mediastinal]
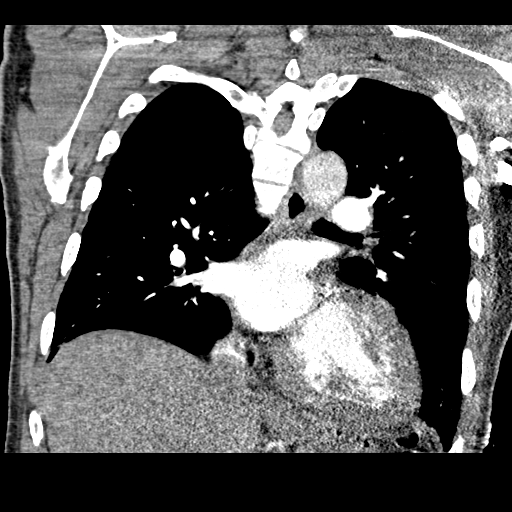

[18 of 36 positions shown; findings below may reference images not displayed]

FINDINGS: Evaluation is limited due to respiratory motion artifact as well as
streak artifact caused by patient's arms.

Cardiovascular: Mild cardiomegaly. There is 3 vessel coronary
vascular calcification. No pericardial effusion. There is mild
atherosclerotic calcification of the thoracic aorta. Evaluation of
the pulmonary arteries is limited due to respiratory motion
artifact. No pulmonary artery embolus identified.

Mediastinum/Nodes: Mildly enlarged right hilar lymph nodes, likely
reactive. The esophagus is grossly unremarkable. No mediastinal
fluid collection.

Lungs/Pleura: Background of emphysema. There is faint cluster of
nodularity in the right middle lobe most consistent with pneumonia,
likely viral or atypical in etiology. Clinical correlation is
recommended. No lobar consolidation, pleural effusion, pneumothorax.
The central airways are patent.

Upper Abdomen: No acute abnormality.

Musculoskeletal: Degenerative changes of the spine. No acute osseous
pathology.

Review of the MIP images confirms the above findings.
IMPRESSION: 1. No CT evidence of pulmonary embolism.
2. Right middle lobe pneumonia, likely viral or atypical in
etiology. Clinical correlation is recommended.
3. Aortic Atherosclerosis ([3X]-[3X]) and Emphysema ([3X]-[3X]).

## 2021-03-02 MED ORDER — MAGNESIUM OXIDE 400 (241.3 MG) MG PO TABS
400.0000 mg | ORAL_TABLET | Freq: Two times a day (BID) | ORAL | Status: DC
  Administered 2021-03-02: 400 mg via ORAL
  Filled 2021-03-02 (×2): qty 1

## 2021-03-02 MED ORDER — FUROSEMIDE 40 MG PO TABS
40.0000 mg | ORAL_TABLET | Freq: Once | ORAL | Status: AC
  Administered 2021-03-02: 40 mg via ORAL
  Filled 2021-03-02: qty 1

## 2021-03-02 MED ORDER — ATORVASTATIN CALCIUM 40 MG PO TABS
40.0000 mg | ORAL_TABLET | Freq: Every day | ORAL | Status: DC
  Administered 2021-03-02 – 2021-03-12 (×10): 40 mg via ORAL
  Filled 2021-03-02 (×10): qty 1

## 2021-03-02 MED ORDER — CEFDINIR 300 MG PO CAPS
300.0000 mg | ORAL_CAPSULE | Freq: Two times a day (BID) | ORAL | Status: DC
  Administered 2021-03-02 – 2021-03-03 (×2): 300 mg via ORAL
  Filled 2021-03-02 (×2): qty 1

## 2021-03-02 MED ORDER — FUROSEMIDE 10 MG/ML IJ SOLN: 40.0000 mg | Freq: Two times a day (BID) | INTRAMUSCULAR | Status: DC

## 2021-03-02 MED ORDER — IPRATROPIUM-ALBUTEROL 0.5-2.5 (3) MG/3ML IN SOLN
3.0000 mL | Freq: Four times a day (QID) | RESPIRATORY_TRACT | Status: DC | PRN
  Administered 2021-03-03 – 2021-03-12 (×6): 3 mL via RESPIRATORY_TRACT
  Filled 2021-03-02 (×7): qty 3

## 2021-03-02 MED ORDER — ENOXAPARIN SODIUM 40 MG/0.4ML ~~LOC~~ SOLN
40.0000 mg | SUBCUTANEOUS | Status: DC
  Administered 2021-03-02 – 2021-03-07 (×5): 40 mg via SUBCUTANEOUS
  Filled 2021-03-02 (×9): qty 0.4

## 2021-03-02 MED ORDER — POTASSIUM CHLORIDE 20 MEQ PO PACK
20.0000 meq | PACK | Freq: Every day | ORAL | Status: DC
  Administered 2021-03-02: 20 meq via ORAL
  Filled 2021-03-02 (×8): qty 1

## 2021-03-02 MED ORDER — GUAIFENESIN ER 600 MG PO TB12
600.0000 mg | ORAL_TABLET | Freq: Two times a day (BID) | ORAL | Status: DC
  Administered 2021-03-02 – 2021-03-08 (×5): 600 mg via ORAL
  Filled 2021-03-02 (×13): qty 1

## 2021-03-02 MED ORDER — DOXYCYCLINE HYCLATE 100 MG PO TABS
100.0000 mg | ORAL_TABLET | Freq: Two times a day (BID) | ORAL | Status: AC
  Administered 2021-03-02 – 2021-03-07 (×9): 100 mg via ORAL
  Filled 2021-03-02 (×10): qty 1

## 2021-03-02 MED ORDER — DIVALPROEX SODIUM ER 500 MG PO TB24
500.0000 mg | ORAL_TABLET | Freq: Two times a day (BID) | ORAL | Status: DC
  Administered 2021-03-02 – 2021-03-03 (×2): 500 mg via ORAL
  Filled 2021-03-02 (×14): qty 1

## 2021-03-02 MED ORDER — SACUBITRIL-VALSARTAN 24-26 MG PO TABS
1.0000 | ORAL_TABLET | Freq: Two times a day (BID) | ORAL | Status: DC
  Administered 2021-03-02 – 2021-03-12 (×16): 1 via ORAL
  Filled 2021-03-02 (×20): qty 1

## 2021-03-02 MED ORDER — ASPIRIN 81 MG PO CHEW
81.0000 mg | CHEWABLE_TABLET | Freq: Every day | ORAL | Status: DC
  Administered 2021-03-03 – 2021-03-12 (×9): 81 mg via ORAL
  Filled 2021-03-02 (×10): qty 1

## 2021-03-02 MED ORDER — NICOTINE 21 MG/24HR TD PT24
21.0000 mg | MEDICATED_PATCH | Freq: Every day | TRANSDERMAL | Status: DC
  Filled 2021-03-02 (×7): qty 1

## 2021-03-02 MED ORDER — LEVETIRACETAM 500 MG PO TABS
500.0000 mg | ORAL_TABLET | Freq: Two times a day (BID) | ORAL | Status: DC
  Administered 2021-03-02 – 2021-03-12 (×14): 500 mg via ORAL
  Filled 2021-03-02 (×19): qty 1

## 2021-03-02 MED ORDER — SPIRONOLACTONE 12.5 MG HALF TABLET
12.5000 mg | ORAL_TABLET | Freq: Every day | ORAL | Status: DC
  Administered 2021-03-03 – 2021-03-12 (×9): 12.5 mg via ORAL
  Filled 2021-03-02 (×10): qty 1

## 2021-03-02 MED ORDER — FUROSEMIDE 10 MG/ML IJ SOLN
40.0000 mg | Freq: Two times a day (BID) | INTRAMUSCULAR | Status: DC
  Administered 2021-03-02 – 2021-03-04 (×4): 40 mg via INTRAVENOUS
  Filled 2021-03-02 (×5): qty 4

## 2021-03-02 MED ORDER — IOHEXOL 350 MG/ML SOLN
100.0000 mL | Freq: Once | INTRAVENOUS | Status: AC | PRN
  Administered 2021-03-02: 100 mL via INTRAVENOUS

## 2021-03-02 MED ORDER — ISOSORBIDE DINITRATE 5 MG PO TABS
5.0000 mg | ORAL_TABLET | Freq: Three times a day (TID) | ORAL | Status: DC
  Administered 2021-03-02 – 2021-03-03 (×2): 5 mg via ORAL
  Filled 2021-03-02 (×3): qty 1

## 2021-03-02 MED ORDER — ALBUTEROL SULFATE HFA 108 (90 BASE) MCG/ACT IN AERS
1.0000 | INHALATION_SPRAY | RESPIRATORY_TRACT | Status: DC | PRN
  Administered 2021-03-05 (×2): 2 via RESPIRATORY_TRACT
  Filled 2021-03-02: qty 6.7

## 2021-03-02 MED ORDER — SENNOSIDES-DOCUSATE SODIUM 8.6-50 MG PO TABS: 1.0000 | ORAL_TABLET | Freq: Every evening | ORAL | Status: DC | PRN

## 2021-03-02 MED ORDER — GABAPENTIN 300 MG PO CAPS
600.0000 mg | ORAL_CAPSULE | Freq: Three times a day (TID) | ORAL | Status: DC
  Administered 2021-03-02 – 2021-03-12 (×24): 600 mg via ORAL
  Filled 2021-03-02 (×27): qty 2

## 2021-03-02 MED ORDER — GABAPENTIN 100 MG PO CAPS
200.0000 mg | ORAL_CAPSULE | Freq: Once | ORAL | Status: AC
  Administered 2021-03-02: 200 mg via ORAL
  Filled 2021-03-02: qty 2

## 2021-03-02 MED ORDER — CARVEDILOL 3.125 MG PO TABS
3.1250 mg | ORAL_TABLET | Freq: Two times a day (BID) | ORAL | Status: DC
  Administered 2021-03-03 – 2021-03-12 (×16): 3.125 mg via ORAL
  Filled 2021-03-02 (×17): qty 1

## 2021-03-02 NOTE — ED Triage Notes (Signed)
[place], c/o bilateral leg pain and swelling x3 days. Hx CHF. Recent toe amputations.

## 2021-03-02 NOTE — ED Provider Notes (Signed)
[facility]-EMERGENCY DEPT Provider Note   CSN: [id number] Arrival date & time: 03/02/21  1312     History Chief Complaint  Patient presents with  . Leg Pain    [name] is a 55 y.o. male with PMHx homelessness, bipolar disorder, schizoaffective disorder, Diabetes, GERD, HTN, COPD, and CHF with EF < 20% who [place] the [place] with complaints of bilateral leg pain/swelling for the past several weeks. Pt recently had bilateral great toe amputation on 02/02 after having osteo. He states that since then he has had pain and swelling in his legs. He reports he is compliant with all of his medications however someone "took away" his pain medication. He was recently seen in the ED on 03/22 with complaint of bilateral foot pain. He had xrays done at that time which did not show any acute findings and was discharged home. Pt reports his pain is worse in the right foot/leg compared to the left. It does appear he has a wheelchair where he is residing however it was not brought with him when EMS brought him here. He also complains of some shortness of breath and chest pain. Per chart review pt is on Plavix for previous stroke.    The history is provided by the patient, medical records and the EMS personnel.       Past Medical History:  Diagnosis Date  . Asthma   . Bipolar 1 disorder (HCC)   . CHF (congestive heart failure) (HCC)   . COPD (chronic obstructive pulmonary disease) (HCC)   . Depression   . Diabetes mellitus without complication (HCC)   . GERD (gastroesophageal reflux disease)   . Homelessness   . Hypertension   . QT prolongation   . Schizoaffective disorder [facility])     Patient Active Problem List   Diagnosis Date Noted  . Skin ulcer of right great toe with necrosis of bone (HCC)   . Encounter for assessment of healthcare decision-making capacity   . Pain, dental   . Dystonia   . Dysarthria   . Shock liver 01/04/2021  . Acute  metabolic encephalopathy 01/04/2021  . CAD (coronary artery disease) 01/04/2021  . HTN (hypertension) 01/04/2021  . Bipolar I disorder (HCC) 01/04/2021  . Cocaine abuse (HCC) 01/04/2021  . Homelessness 01/04/2021  . HLD (hyperlipidemia) 01/04/2021  . Osteomyelitis of great toe (HCC)   . Ulcer of great toe, left, with necrosis of bone (HCC)   . Ulcer of great toe, right, with necrosis of muscle (HCC)   . Acute osteomyelitis of toe, left (HCC)   . Acute on chronic combined systolic and diastolic CHF (congestive heart failure) (HCC) 12/20/2020  . Respiratory failure (HCC)   . Ischemic stroke (HCC) 12/17/2020  . 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 Diabetic foot ulcer (HCC) 12/08/2020  . CHF (congestive heart failure) (HCC) 11/14/2020  . CHF exacerbation (HCC) 11/13/2020  . Hypokalemia   . Hyperlipidemia   . Acute respiratory failure with hypoxia (HCC) 10/26/2020  . COPD with acute exacerbation (HCC) 10/14/2020  . Acute on chronic combined systolic and diastolic CHF (congestive heart failure) (HCC) 10/02/2020  . Near syncope 07/21/2020  . Schizoaffective disorder (HCC) 06/24/2019  . SOB (shortness of breath)   . QT prolongation 05/25/2019  . Cocaine abuse (HCC)   . Elevated troponin   . Homeless   . Suicidal ideation   . Bilateral foot pain 04/05/2019  . Housing situation unstable 03/28/2019  . Bilateral lower  extremity edema   . Asthma 12/26/2016  . Tobacco use disorder 12/26/2016  . Schizophrenia (HCC) 12/26/2016  . Cocaine abuse with cocaine-induced mood disorder (HCC) 09/06/2016  . Alcohol use disorder, mild, abuse 07/31/2016  . Substance induced mood disorder (HCC) 07/30/2016  . Schizoaffective disorder, bipolar type (HCC) 05/31/2016  . Cocaine use disorder, severe, dependence (HCC) 05/31/2016  . Hypertension 05/31/2016    Past Surgical History:  Procedure Laterality Date  . AMPUTATION TOE Bilateral 01/02/2021   Procedure: AMPUTATION TOE;  Surgeon: Park Liter,  DPM;  Location: [facility] OR;  Service: Podiatry;  Laterality: Bilateral;  Ok to leave patient on hospital bed  . IR ANGIO INTRA EXTRACRAN SEL COM CAROTID INNOMINATE BILAT MOD SED  12/17/2020  . IR ANGIO VERTEBRAL SEL VERTEBRAL BILAT MOD SED  12/17/2020  . RADIOLOGY WITH ANESTHESIA N/A 12/17/2020   Procedure: IR WITH ANESTHESIA;  Surgeon: Radiologist, Medication, MD;  Location: [facility] OR;  Service: Radiology;  Laterality: N/A;       Family History  Problem Relation Age of Onset  . Drug abuse Mother   . Suicidality Mother     Social History   Tobacco Use  . Smoking status: Current Every Day Smoker    Years: 30.00    Types: Cigarettes  . Smokeless tobacco: Never Used  Vaping Use  . Vaping Use: Never used  Substance Use Topics  . Alcohol use: Yes    Comment: infrequently  . Drug use: Yes    Types: Cocaine, Marijuana, "Crack" cocaine    Home Medications Prior to Admission medications   Medication Sig Start Date End Date Taking? Authorizing Provider  albuterol (VENTOLIN HFA) 108 (90 Base) MCG/ACT inhaler Inhale 2 puffs into the lungs every 6 (six) hours as needed for wheezing or shortness of breath. 11/16/20   Pokhrel, Rebekah Chesterfield, MD  aspirin 81 MG chewable tablet Chew 1 tablet (81 mg total) by mouth daily. 02/03/21   Leatha Gilding, MD  atorvastatin (LIPITOR) 40 MG tablet Take 1 tablet (40 mg total) by mouth daily. 02/03/21   Leatha Gilding, MD  atorvastatin (LIPITOR) 40 MG tablet TAKE 1 TABLET (40 MG TOTAL) BY MOUTH DAILY. 12/10/20 12/10/21  Jackelyn Poling, DO  carvedilol (COREG) 3.125 MG tablet Take 1 tablet (3.125 mg total) by mouth 2 (two) times daily with a meal. 11/16/20   Pokhrel, Laxman, MD  cloNIDine (CATAPRES) 0.1 MG tablet Take 1 tablet (0.1 mg total) by mouth 3 (three) times daily. 11/16/20   Pokhrel, Rebekah Chesterfield, MD  clopidogrel (PLAVIX) 75 MG tablet Take 1 tablet (75 mg total) by mouth daily. 11/16/20   Pokhrel, Rebekah Chesterfield, MD  divalproex (DEPAKOTE ER) 500 MG 24 hr tablet Take 1 tablet (500 mg  total) by mouth in the morning and at bedtime. Patient not taking: No sig reported 11/16/20   Pokhrel, Rebekah Chesterfield, MD  divalproex (DEPAKOTE ER) 500 MG 24 hr tablet Take 1 tablet (500 mg total) by mouth in the morning and at bedtime. 02/03/21   Leatha Gilding, MD  doxycycline (VIBRA-TABS) 100 MG tablet Take 1 tablet (100 mg total) by mouth every 12 (twelve) hours. 12/10/20   Jackelyn Poling, DO  doxycycline (VIBRA-TABS) 100 MG tablet TAKE 1 TABLET (100 MG TOTAL) BY MOUTH EVERY TWELVE HOURS. 12/10/20 12/10/21  Jackelyn Poling, DO  furosemide (LASIX) 40 MG tablet Take 1 tablet (40 mg total) by mouth daily. Patient not taking: No sig reported 11/16/20   Pokhrel, Rebekah Chesterfield, MD  furosemide (LASIX) 40 MG tablet Take 1 tablet (40  mg total) by mouth daily. 02/03/21   Leatha Gilding, MD  gabapentin (NEURONTIN) 100 MG capsule Take 2 capsules (200 mg total) by mouth 2 (two) times daily. Patient not taking: No sig reported 11/16/20   Pokhrel, Rebekah Chesterfield, MD  gabapentin (NEURONTIN) 300 MG capsule Take 2 capsules (600 mg total) by mouth 3 (three) times daily. 02/03/21   Leatha Gilding, MD  hydrALAZINE (APRESOLINE) 50 MG tablet Take 1 tablet (50 mg total) by mouth every 8 (eight) hours. 11/16/20   Pokhrel, Rebekah Chesterfield, MD  isosorbide dinitrate (ISORDIL) 5 MG tablet Take 1 tablet (5 mg total) by mouth 3 (three) times daily. 02/03/21   Leatha Gilding, MD  isosorbide mononitrate (IMDUR) 30 MG 24 hr tablet Take 1 tablet (30 mg total) by mouth daily. Patient not taking: Reported on 02/12/2021 11/16/20 12/16/20  Joycelyn Das, MD  levETIRAcetam (KEPPRA) 500 MG tablet Take 1 tablet (500 mg total) by mouth 2 (two) times daily. 02/03/21   Leatha Gilding, MD  lisinopril (ZESTRIL) 20 MG tablet Take 1 tablet (20 mg total) by mouth daily. 12/10/20   Welborn, Ryan, DO  lisinopril (ZESTRIL) 20 MG tablet TAKE 1 TABLET (20 MG TOTAL) BY MOUTH DAILY. 12/10/20 12/10/21  Jackelyn Poling, DO  magnesium oxide (MAG-OX) 400 (241.3 Mg) MG tablet Take 1 tablet  (400 mg total) by mouth 2 (two) times daily. 02/03/21   Gherghe, Daylene Katayama, MD  oxyCODONE (OXY IR/ROXICODONE) 5 MG immediate release tablet TAKE 1 TABLET (5 MG TOTAL) BY MOUTH EVERY FOUR HOURS AS NEEDED FOR UP TO 2 DAYS FOR SEVERE PAIN. 12/10/20 06/08/21  Jackelyn Poling, DO  predniSONE (DELTASONE) 10 MG tablet Take 1 tablet (10 mg total) by mouth daily with breakfast. 12/11/20   Jackelyn Poling, DO  predniSONE (DELTASONE) 10 MG tablet TAKE 1 TABLET (10 MG TOTAL) BY MOUTH DAILY WITH BREAKFAST. 12/10/20 12/10/21  Jackelyn Poling, DO  sacubitril-valsartan (ENTRESTO) 24-26 MG Take 1 tablet by mouth 2 (two) times daily. 02/03/21   Leatha Gilding, MD  senna-docusate (SENOKOT-S) 8.6-50 MG tablet Take 1 tablet by mouth at bedtime as needed for mild constipation. 10/07/20   Marguerita Merles Latif, DO  spironolactone (ALDACTONE) 25 MG tablet Take 0.5 tablets (12.5 mg total) by mouth daily. 01/15/21   Russella Dar, NP  traZODone (DESYREL) 50 MG tablet Take 1 tablet (50 mg total) by mouth at bedtime. 11/16/20   Pokhrel, Rebekah Chesterfield, MD  umeclidinium bromide (INCRUSE ELLIPTA) 62.5 MCG/INH AEPB Inhale 1 puff into the lungs daily. 11/16/20   Pokhrel, Rebekah Chesterfield, MD    Allergies    Ibuprofen, Ibuprofen, Acetaminophen, Latex, Lyrica cr [pregabalin er], Peanut-containing drug products, and Tylenol [acetaminophen]  Review of Systems   Review of Systems  Constitutional: Negative for chills, diaphoresis and fever.  Respiratory: Positive for shortness of breath.   Cardiovascular: Positive for chest pain and leg swelling.  Gastrointestinal: Negative for nausea and vomiting.  Musculoskeletal: Positive for arthralgias.  All other systems reviewed and are negative.   Physical Exam Updated Vital Signs BP (!) 143/104 (BP Location: Right Arm)   Pulse (!) 117   Temp 98.5 F (36.9 C)   Resp 17   SpO2 100%   Physical Exam Vitals and nursing note reviewed.  Constitutional:      Appearance: He is obese. He is not ill-appearing or  diaphoretic.  HENT:     Head: Normocephalic and atraumatic.  Eyes:     Conjunctiva/sclera: Conjunctivae normal.  Cardiovascular:     Rate and Rhythm:  Regular rhythm. Tachycardia present.     Pulses: Normal pulses.  Pulmonary:     Effort: Pulmonary effort is normal.     Breath sounds: Normal breath sounds. No wheezing, rhonchi or rales.  Abdominal:     Palpations: Abdomen is soft.     Tenderness: There is no abdominal tenderness. There is no guarding or rebound.     Comments: Speaking in full sentences without difficulty. Satting 100% on RA. LCTAB.   Musculoskeletal:     Cervical back: Neck supple.     Right lower leg: Edema present.     Left lower leg: Edema present.     Comments: 1+ pitting edema bilaterally. No calf TTP. 2+ DP pulses bilaterally.  Hx of bilateral great toe amputation.   Skin:    General: Skin is warm and dry.  Neurological:     Mental Status: He is alert.     ED Results / Procedures / Treatments   Labs (all labs ordered are listed, but only abnormal results are displayed) Labs Reviewed  COMPREHENSIVE METABOLIC PANEL  CBC WITH DIFFERENTIAL/PLATELET  BRAIN NATRIURETIC PEPTIDE  D-DIMER, QUANTITATIVE  TROPONIN I (HIGH SENSITIVITY)    EKG None  Radiology DG Chest Port 1 View  Result Date: 03/02/2021 CLINICAL DATA:  Bilateral leg pain and swelling.  Productive cough. EXAM: PORTABLE CHEST 1 VIEW COMPARISON:  November 27, 2020.  December 21, 2020 FINDINGS: There is mild prominence the hila, similar since November 27, 2020. Cardiomegaly is stable. The mediastinum is unchanged. No pneumothorax. No nodules or masses. Mild opacity in the right base is favored represent atelectasis with developing infiltrate considered less likely. No overt edema. IMPRESSION: 1. Hilar prominence is similar since comparison studies and could be technical in nature given portable imaging and normal hilar structures on the November 13, 2020 PA and lateral chest x-ray. A PA and lateral  chest x-ray could better evaluate. 2. Stable cardiomegaly. 3. Mild opacity in the right base is favored represent atelectasis. Early developing infiltrate considered less likely. 4. No overt edema. Electronically Signed   By: Gerome Sam III M.D   On: 03/02/2021 14:58    Procedures Procedures   Medications Ordered in ED Medications - No data to display  ED Course  I have reviewed the triage vital signs and the nursing notes.  Pertinent labs & imaging results that were available during my care of the patient were reviewed by me and considered in my medical decision making (see chart for details).    MDM Rules/Calculators/A&P                          55 year old male who presents to the ED today via EMS with complaint of bilateral foot pain as well as leg swelling status post bilateral great toe amputations in February.  She with history of CHF with an EF less than 20%.  Reports he has been compliant on his Lasix however he then briefly goes into how somebody took away his medications, I suspect he is starting back pain medication.  He does appear he has allergies to both ibuprofen and Tylenol.  Patient was recently seen in the ED on 03/22 complaining of bilateral foot pain with negative x-rays.  He is on Plavix without history of PE/DVT.  He does mention he is having some chest pain as well as shortness of breath.  On arrival to the ED patient is tachycardic in the 110s.  Remainder of vitals unremarkable.  Will work-up for his leg swelling and pain at this time.  Concern for possible CHF exacerbation versus DVT/PE.  Will obtain a chest x-ray as well as a D-dimer.  If dimer is positive he will need a CTA.   CXR: IMPRESSION:  1. Hilar prominence is similar since comparison studies and could be  technical in nature given portable imaging and normal hilar  structures on the November 13, 2020 PA and lateral chest x-ray. A PA  and lateral chest x-ray could better evaluate.  2. Stable  cardiomegaly.  3. Mild opacity in the right base is favored represent atelectasis.  Early developing infiltrate considered less likely.  4. No overt edema.   Labs not collected yet. Will await labs prior to treating for possible PNA vs DVT/PE vs CHF exacerbation however no overt edema on CXR.   At shift change case [facility] PA-C, who will dispo patient accordingly once workup is complete.   This note was prepared using Dragon voice recognition software and may include unintentional dictation errors due to the inherent limitations of voice recognition software.   Final Clinical Impression(s) / ED Diagnoses Final diagnoses:  Bilateral foot pain  Localized swelling of both lower legs    Rx / DC Orders ED Discharge Orders    None       Tanda RockersVenter, Wilmont Olund, PA-C 03/02/21 1509    Rolan BuccoBelfi, Melanie, MD 03/03/21 1513

## 2021-03-02 NOTE — ED Notes (Signed)
Patient is unable to stand to move to the bed.  Hoya lift will not lift patient high enough to place patient on bed Patient is sitting in wheel chair

## 2021-03-02 NOTE — H&P (Signed)
History and Physical    [name] CHE:[id number] DOB: 1966-05-21 DOA: 03/02/2021  PCP: [name]  Patient coming from: [facility]  I have personally briefly reviewed patient's old medical records in [facility]  Chief Complaint: Lower extremity swelling, shortness of breath, orthopnea  HPI: [name] is a 55 y.o. male with medical history significant for chronic combined systolic and diastolic CHF (EF <53%, G2DD by TTE 12/18/2020), history of cerebellar CVA, CAD, bipolar disorder, COPD, HTN, HLD, bilateral first toe ulcer/osteomyelitis s/p bilateral first toe amputation 01/02/21, substance use disorder, and homelessness who presents to the ED for evaluation of worsening lower extremity swelling and shortness of breath.  Patient had a recent prolonged hospitalization from 12/17/2020-02/03/2021.  He was found unresponsive outside and work-up revealed a right superior and middle cerebellar stroke.  He was placed on DAPT with aspirin/Plavix which he completed on hospital and was discharged on aspirin alone.  Work-up during admission also revealed chronic combined systolic and diastolic CHF with EF <20%.  He was started on medical directed therapy.  He underwent bilateral first toe amputations 01/02/2021 for ulcer/osteomyelitis.  He was placed on Keppra for suspected seizure activity.  He was noted to be verbally and physically aggressive towards staff throughout the hospital stay.  He was seen by psychiatry and per their evaluation was felt to have an unspecified mood disorder without indication for involuntary commitment or inpatient psychiatry hospitalization.  He was discharged on Depakote.  Patient returns to the ED for worsening lower extremity swelling and pain in both of his legs.  He says he is homeless and has been using a wheelchair for all his activities.  He says he has been taking his medications.  He has been having shortness of breath with orthopnea.  He reports cough  with occasional green/yellow sputum production.  He denies any subjective fevers, chills, diaphoresis, chest pain, abdominal pain.  He reports ongoing tobacco use between 0.5-1 PPD.  He admits to continued cocaine use with last use 3 days ago.  He denies any alcohol use.  He says he does not have any close family or friends that he is in contact with.  ED Course:  Initial vitals showed BP 143/104, pulse 117, RR 17, temp 98.5 F, SPO2 100% on room air.  Labs show sodium 136, potassium 3.7, bicarb 26, BUN 14, creatinine 1.11, serum glucose 81, LFTs within normal limits, WBC 4.5, hemoglobin 14.3, platelets 291,000, BNP 424.6, D-dimer 0.69, high-sensitivity troponin I 36 > 27.  SARS-CoV-2 PCR collected and pending.  Portable chest x-ray shows similar hilar prominence compared to prior, stable cardiomegaly, mild opacity in the right base favored to represent atelectasis.  CTA chest PE study shows faint cluster of nodularity in the right middle lobe.  No evidence of PE.  Patient was given oral Lasix 40 mg once and gabapentin.  The hospitalist service was consulted to admit for further evaluation and management.  Review of Systems: All systems reviewed and are negative except as documented in history of present illness above.   Past Medical History:  Diagnosis Date  . Asthma   . Bipolar 1 disorder (HCC)   . CHF (congestive heart failure) (HCC)   . COPD (chronic obstructive pulmonary disease) (HCC)   . Depression   . Diabetes mellitus without complication (HCC)   . GERD (gastroesophageal reflux disease)   . Homelessness   . Hypertension   . QT prolongation   . Schizoaffective disorder [facility])     Past Surgical History:  Procedure Laterality Date  . 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 IR ANGIO INTRA EXTRACRAN SEL COM CAROTID  INNOMINATE BILAT MOD SED  12/17/2020  . IR ANGIO VERTEBRAL SEL VERTEBRAL BILAT MOD SED  12/17/2020  . RADIOLOGY WITH ANESTHESIA N/A 12/17/2020   Procedure: IR WITH ANESTHESIA;  Surgeon: Radiologist, Medication, MD;  Location: [facility] OR;  Service: Radiology;  Laterality: N/A;    Social History:  reports that he has been smoking cigarettes. He has smoked for the past 30.00 years. He has never used smokeless tobacco. He reports current alcohol use. He reports current drug use. Drugs: Cocaine, Marijuana, and "Crack" cocaine.  Allergies  Allergen Reactions  . Ibuprofen Anaphylaxis  . Ibuprofen Anaphylaxis  . Acetaminophen Hives, Nausea And Vomiting and Rash    Pt overdosed on bottle of APAP 05/06/16   . Latex Other (See Comments)    Reaction??  Roselee Nova Cr [Pregabalin Er] Other (See Comments)    Dystonia and dysarthria  . Peanut-Containing Drug Products Other (See Comments)    Allergic to peanut butter (reaction??)  . Tylenol [Acetaminophen] Hives and Nausea And Vomiting    Per pt he can't have it because it will kill him.    Family History  Problem Relation Age of Onset  . Drug abuse Mother   . Suicidality Mother      Prior to Admission medications   Medication Sig Start Date End Date Taking? Authorizing Provider  albuterol (VENTOLIN HFA) 108 (90 Base) MCG/ACT inhaler Inhale 2 puffs into the lungs every 6 (six) hours as needed for wheezing or shortness of breath. 11/16/20   Pokhrel, Rebekah Chesterfield, MD  aspirin 81 MG chewable tablet Chew 1 tablet (81 mg total) by mouth daily. 02/03/21   Leatha Gilding, MD  atorvastatin (LIPITOR) 40 MG tablet Take 1 tablet (40 mg total) by mouth daily. 02/03/21   Leatha Gilding, MD  atorvastatin (LIPITOR) 40 MG tablet TAKE 1 TABLET (40 MG TOTAL) BY MOUTH DAILY. 12/10/20 12/10/21  Jackelyn Poling, DO  carvedilol (COREG) 3.125 MG tablet Take 1 tablet (3.125 mg total) by mouth 2 (two) times daily with a meal. 11/16/20   Pokhrel, Laxman, MD  cloNIDine (CATAPRES) 0.1 MG  tablet Take 1 tablet (0.1 mg total) by mouth 3 (three) times daily. 11/16/20   Pokhrel, Rebekah Chesterfield, MD  clopidogrel (PLAVIX) 75 MG tablet Take 1 tablet (75 mg total) by mouth daily. 11/16/20   Pokhrel, Rebekah Chesterfield, MD  divalproex (DEPAKOTE ER) 500 MG 24 hr tablet Take 1 tablet (500 mg total) by mouth in the morning and at bedtime. Patient not taking: No sig reported 11/16/20   Pokhrel, Rebekah Chesterfield, MD  divalproex (DEPAKOTE ER) 500 MG 24 hr tablet Take 1 tablet (500 mg total) by mouth in the morning and at bedtime. 02/03/21   Leatha Gilding, MD  doxycycline (VIBRA-TABS) 100 MG tablet Take 1 tablet (100 mg total) by mouth every 12 (twelve) hours. 12/10/20   Jackelyn Poling, DO  doxycycline (VIBRA-TABS) 100 MG tablet TAKE 1 TABLET (100 MG TOTAL) BY MOUTH EVERY TWELVE HOURS. 12/10/20 12/10/21  Jackelyn Poling, DO  furosemide (LASIX) 40 MG tablet Take 1 tablet (40 mg total) by mouth daily. Patient not taking: No sig reported 11/16/20   Pokhrel, Rebekah Chesterfield, MD  furosemide (LASIX) 40 MG tablet Take 1 tablet (40 mg total) by mouth daily.  02/03/21   Leatha Gilding, MD  gabapentin (NEURONTIN) 100 MG capsule Take 2 capsules (200 mg total) by mouth 2 (two) times daily. Patient not taking: No sig reported 11/16/20   Pokhrel, Rebekah Chesterfield, MD  gabapentin (NEURONTIN) 300 MG capsule Take 2 capsules (600 mg total) by mouth 3 (three) times daily. 02/03/21   Leatha Gilding, MD  hydrALAZINE (APRESOLINE) 50 MG tablet Take 1 tablet (50 mg total) by mouth every 8 (eight) hours. 11/16/20   Pokhrel, Rebekah Chesterfield, MD  isosorbide dinitrate (ISORDIL) 5 MG tablet Take 1 tablet (5 mg total) by mouth 3 (three) times daily. 02/03/21   Leatha Gilding, MD  isosorbide mononitrate (IMDUR) 30 MG 24 hr tablet Take 1 tablet (30 mg total) by mouth daily. Patient not taking: Reported on 02/12/2021 11/16/20 12/16/20  Joycelyn Das, MD  levETIRAcetam (KEPPRA) 500 MG tablet Take 1 tablet (500 mg total) by mouth 2 (two) times daily. 02/03/21   Leatha Gilding, MD  lisinopril  (ZESTRIL) 20 MG tablet Take 1 tablet (20 mg total) by mouth daily. 12/10/20   Welborn, Ryan, DO  lisinopril (ZESTRIL) 20 MG tablet TAKE 1 TABLET (20 MG TOTAL) BY MOUTH DAILY. 12/10/20 12/10/21  Jackelyn Poling, DO  magnesium oxide (MAG-OX) 400 (241.3 Mg) MG tablet Take 1 tablet (400 mg total) by mouth 2 (two) times daily. 02/03/21   Gherghe, Daylene Katayama, MD  oxyCODONE (OXY IR/ROXICODONE) 5 MG immediate release tablet TAKE 1 TABLET (5 MG TOTAL) BY MOUTH EVERY FOUR HOURS AS NEEDED FOR UP TO 2 DAYS FOR SEVERE PAIN. 12/10/20 06/08/21  Jackelyn Poling, DO  predniSONE (DELTASONE) 10 MG tablet Take 1 tablet (10 mg total) by mouth daily with breakfast. 12/11/20   Jackelyn Poling, DO  predniSONE (DELTASONE) 10 MG tablet TAKE 1 TABLET (10 MG TOTAL) BY MOUTH DAILY WITH BREAKFAST. 12/10/20 12/10/21  Jackelyn Poling, DO  sacubitril-valsartan (ENTRESTO) 24-26 MG Take 1 tablet by mouth 2 (two) times daily. 02/03/21   Leatha Gilding, MD  senna-docusate (SENOKOT-S) 8.6-50 MG tablet Take 1 tablet by mouth at bedtime as needed for mild constipation. 10/07/20   Marguerita Merles Latif, DO  spironolactone (ALDACTONE) 25 MG tablet Take 0.5 tablets (12.5 mg total) by mouth daily. 01/15/21   Russella Dar, NP  traZODone (DESYREL) 50 MG tablet Take 1 tablet (50 mg total) by mouth at bedtime. 11/16/20   Pokhrel, Rebekah Chesterfield, MD  umeclidinium bromide (INCRUSE ELLIPTA) 62.5 MCG/INH AEPB Inhale 1 puff into the lungs daily. 11/16/20   Joycelyn Das, MD    Physical Exam: Vitals:   03/02/21 1545 03/02/21 1557 03/02/21 1600 03/02/21 1715  BP:  (!) 127/98 (!) 139/113 (!) 153/97  Pulse:  (!) 102 (!) 102 (!) 104  Resp: (!) 30 (!) 27 (!) 26 (!) 21  Temp:  98.5 F (36.9 C)    SpO2:  100% 100% 100%   Constitutional: Resting in bed with head elevated NAD, calm, comfortable Eyes: PERRL, lids and conjunctivae normal ENMT: Mucous membranes are dry. Posterior pharynx clear of any exudate or lesions.poor dentition.  Neck: normal, supple, no  masses. Respiratory: Faint end expiratory wheezing throughout the lung fields.  Normal respiratory effort. No accessory muscle use.  Cardiovascular: Tachycardic with regular rhythm, no murmurs / rubs / gallops.  +2 bilateral lower extremity edema.  Abdomen: no tenderness, no masses palpated. Bowel sounds positive.  Musculoskeletal: S/p bilateral first toe amputations, good ROM. Normal muscle tone.  Skin: S/p bilateral first toe amputations.  Dry scabbing at surgical site without  surrounding erythema or active discharge.  Unkempt toenails. Neurologic: Somewhat dysarthric speech otherwise CN 2-12 grossly intact. Sensation intact. Strength equal all extremities. Psychiatric: Alert and oriented x 3.  Rapid and uninterrupted speech.  Labs on Admission: I have personally reviewed following labs and imaging studies  CBC: Recent Labs  Lab 03/02/21 1530  WBC 4.5  NEUTROABS 2.1  HGB 14.3  HCT 43.6  MCV 95.0  PLT 291   Basic Metabolic Panel: Recent Labs  Lab 03/02/21 1530  NA 136  K 3.7  CL 100  CO2 26  GLUCOSE 81  BUN 14  CREATININE 1.11  CALCIUM 8.8*   GFR: Estimated Creatinine Clearance: 84.7 mL/min (by C-G formula based on SCr of 1.11 mg/dL). Liver Function Tests: Recent Labs  Lab 03/02/21 1530  AST 19  ALT 11  ALKPHOS 78  BILITOT 1.0  PROT 7.3  ALBUMIN 3.6   No results for input(s): LIPASE, AMYLASE in the last 168 hours. No results for input(s): AMMONIA in the last 168 hours. Coagulation Profile: No results for input(s): INR, PROTIME in the last 168 hours. Cardiac Enzymes: No results for input(s): CKTOTAL, CKMB, CKMBINDEX, TROPONINI in the last 168 hours. BNP (last 3 results) No results for input(s): PROBNP in the last 8760 hours. HbA1C: No results for input(s): HGBA1C in the last 72 hours. CBG: No results for input(s): GLUCAP in the last 168 hours. Lipid Profile: No results for input(s): CHOL, HDL, LDLCALC, TRIG, CHOLHDL, LDLDIRECT in the last 72  hours. Thyroid Function Tests: No results for input(s): TSH, T4TOTAL, FREET4, T3FREE, THYROIDAB in the last 72 hours. Anemia Panel: No results for input(s): VITAMINB12, FOLATE, FERRITIN, TIBC, IRON, RETICCTPCT in the last 72 hours. Urine analysis:    Component Value Date/Time   COLORURINE AMBER (A) 02/09/2021 1455   APPEARANCEUR CLEAR 02/09/2021 1455   LABSPEC 1.030 02/09/2021 1455   PHURINE 5.0 02/09/2021 1455   GLUCOSEU NEGATIVE 02/09/2021 1455   HGBUR NEGATIVE 02/09/2021 1455   BILIRUBINUR NEGATIVE 02/09/2021 1455   KETONESUR 5 (A) 02/09/2021 1455   PROTEINUR 30 (A) 02/09/2021 1455   NITRITE NEGATIVE 02/09/2021 1455   LEUKOCYTESUR NEGATIVE 02/09/2021 1455    Radiological Exams on Admission: CT Angio Chest PE W and/or Wo Contrast  Result Date: 03/02/2021 CLINICAL DATA:  55 year old male with positive D-dimer. Concern for pulmonary embolism. EXAM: CT ANGIOGRAPHY CHEST WITH CONTRAST TECHNIQUE: Multidetector CT imaging of the chest was performed using the standard protocol during bolus administration of intravenous contrast. Multiplanar CT image reconstructions and MIPs were obtained to evaluate the vascular anatomy. CONTRAST:  OMNIPAQUE IOHEXOL 350 MG/ML SOLN COMPARISON:  Chest radiograph dated 03/02/2021. FINDINGS: Evaluation is limited due to respiratory motion artifact as well as streak artifact caused by patient's arms. Cardiovascular: Mild cardiomegaly. There is 3 vessel coronary vascular calcification. No pericardial effusion. There is mild atherosclerotic calcification of the thoracic aorta. Evaluation of the pulmonary arteries is limited due to respiratory motion artifact. No pulmonary artery embolus identified. Mediastinum/Nodes: Mildly enlarged right hilar lymph nodes, likely reactive. The esophagus is grossly unremarkable. No mediastinal fluid collection. Lungs/Pleura: Background of emphysema. There is faint cluster of nodularity in the right middle lobe most consistent with  pneumonia, likely viral or atypical in etiology. Clinical correlation is recommended. No lobar consolidation, pleural effusion, pneumothorax. The central airways are patent. Upper Abdomen: No acute abnormality. Musculoskeletal: Degenerative changes of the spine. No acute osseous pathology. Review of the MIP images confirms the above findings. IMPRESSION: 1. No CT evidence of pulmonary embolism. 2.  Right middle lobe pneumonia, likely viral or atypical in etiology. Clinical correlation is recommended. 3. Aortic Atherosclerosis (ICD10-I70.0) and Emphysema (ICD10-J43.9). Electronically Signed   By: Elgie CollardArash  Radparvar M.D.   On: 03/02/2021 17:22   DG Chest Port 1 View  Result Date: 03/02/2021 CLINICAL DATA:  Bilateral leg pain and swelling.  Productive cough. EXAM: PORTABLE CHEST 1 VIEW COMPARISON:  November 27, 2020.  December 21, 2020 FINDINGS: There is mild prominence the hila, similar since November 27, 2020. Cardiomegaly is stable. The mediastinum is unchanged. No pneumothorax. No nodules or masses. Mild opacity in the right base is favored represent atelectasis with developing infiltrate considered less likely. No overt edema. IMPRESSION: 1. Hilar prominence is similar since comparison studies and could be technical in nature given portable imaging and normal hilar structures on the November 13, 2020 PA and lateral chest x-ray. A PA and lateral chest x-ray could better evaluate. 2. Stable cardiomegaly. 3. Mild opacity in the right base is favored represent atelectasis. Early developing infiltrate considered less likely. 4. No overt edema. Electronically Signed   By: Gerome Samavid  Wooden III M.D   On: 03/02/2021 14:58   VAS US LOWER EXTREMITY VENOUS (DVT) (ONLY [facility] & [facility] 7a-7p)  Result Date: 03/02/2021  Lower Venous DVT Study Indications: Edema.  Limitations: Patient refused to remove pants and was unable to recline for study, secondary to severe shortness of breath. Patient could not tolerate full compression in calves  secondary to pain. Comparison Study: No prior study on file Performing Technologist: Sherren Kernsandace Kanady RVS  Examination Guidelines: A complete evaluation includes B-mode imaging, spectral Doppler, color Doppler, and power Doppler as needed of all accessible portions of each vessel. Bilateral testing is considered an integral part of a complete examination. Limited examinations for reoccurring indications may be performed as noted. The reflux portion of the exam is performed with the patient in reverse Trendelenburg.  +---------+---------------+---------+-----------+----------+--------------+ RIGHT    CompressibilityPhasicitySpontaneityPropertiesThrombus Aging +---------+---------------+---------+-----------+----------+--------------+ CFV                                                   Not insonated  +---------+---------------+---------+-----------+----------+--------------+ SFJ                                                   Not insonated  +---------+---------------+---------+-----------+----------+--------------+ FV Prox                                               Not insonated  +---------+---------------+---------+-----------+----------+--------------+ FV Mid                                                Not insonated  +---------+---------------+---------+-----------+----------+--------------+ FV Distal                                             Not insonated  +---------+---------------+---------+-----------+----------+--------------+ PFV  Not insonated  +---------+---------------+---------+-----------+----------+--------------+ POP      Full           Yes      Yes                                 +---------+---------------+---------+-----------+----------+--------------+ PTV      Full                                                         +---------+---------------+---------+-----------+----------+--------------+ PERO     Full                                                        +---------+---------------+---------+-----------+----------+--------------+   +---------+---------------+---------+-----------+----------+---------------+ LEFT     CompressibilityPhasicitySpontaneityPropertiesThrombus Aging  +---------+---------------+---------+-----------+----------+---------------+ CFV                                                   Not insonated   +---------+---------------+---------+-----------+----------+---------------+ SFJ                                                   Not insonated   +---------+---------------+---------+-----------+----------+---------------+ FV Prox                                               Not insonated   +---------+---------------+---------+-----------+----------+---------------+ FV Mid                                                Not insonated   +---------+---------------+---------+-----------+----------+---------------+ FV Distal                                             Not insonated   +---------+---------------+---------+-----------+----------+---------------+ PFV                                                   Not insonated   +---------+---------------+---------+-----------+----------+---------------+ POP      Full           Yes      Yes                                  +---------+---------------+---------+-----------+----------+---------------+ PTV  patent by color +---------+---------------+---------+-----------+----------+---------------+ PERO                                                  patent by color +---------+---------------+---------+-----------+----------+---------------+    Summary: RIGHT: - There is no evidence of deep vein thrombosis in the lower extremity. However,  portions of this examination were limited- see technologist comments above.  significant interstitial edema noted throughout the calf  LEFT: - There is no evidence of deep vein thrombosis in the lower extremity. However, portions of this examination were limited- see technologist comments above.  - significant interstitial edema noted throughout the calf  *See table(s) above for measurements and observations.    Preliminary     EKG: Personally reviewed. Sinus tachycardia, rate 113, PVC, LVH, motion artifact.  Rate is faster when compared to prior.  Assessment/Plan Principal Problem:   Acute on chronic combined systolic (congestive) and diastolic (congestive) heart failure (HCC) Active Problems:   Schizoaffective disorder, bipolar type (HCC)   Hyperlipidemia   CAD (coronary artery disease)   HTN (hypertension)   History of stroke   Substance use disorder   [name] is a 55 y.o. male with medical history significant for chronic combined systolic and diastolic CHF (EF <40%, G2DD by TTE 12/18/2020), history of cerebellar CVA, CAD, bipolar disorder, COPD, HTN, HLD, bilateral first toe ulcer/osteomyelitis s/p bilateral first toe amputation 01/02/21, substance use disorder, and homelessness who is admitted with acute on chronic combined systolic and diastolic CHF.  Acute on chronic combined systolic and diastolic CHF: Presenting with worsening lower extremity edema, orthopnea, DOE.  BNP mildly elevated.  Lower extremity Dopplers negative for DVT bilaterally.  Last EF <20%, G2DD by TTE 12/18/2020. -Start IV Lasix 40 mg twice daily -Strict I/O's and daily weights -Continue Coreg, Entresto, spironolactone, Isordil  Right middle lobe pneumonia: Faint nodular changes seen on CTA chest suggestive of early developing RML pneumonia.  Patient does report new cough productive of green/yellow sputum. -Start oral cefdinir and doxycycline for 5-day course  COPD: Has some wheezing on admission.  Currently  saturating well on room air. -Albuterol and duo nebs as needed -Doxycycline as above  History of right and left cerebellar ischemic stroke: Continue aspirin and atorvastatin.  History of bilateral great toe ulcer/osteomyelitis s/p bilateral great toe amputation 01/02/21: Poor hygiene of both lower extremities otherwise no evidence of recurrent infection at time of admission.  CAD: Denies any chest pain.  Troponin minimally elevated but less than previous baseline.  Continue aspirin, statin, Isordil.  Hypertension: Continue Coreg, Entresto, spironolactone, Isordil.  Bipolar disorder/aggressive behavior: Continue Depakote.  History of suspected seizure activity: Continue Keppra.  Hyperlipidemia: Continue atorvastatin.  Substance use disorder: Reports ongoing tobacco and cocaine use.  Nicotine patch ordered.  Homeless: Request TOC consult.  DVT prophylaxis: Lovenox Code Status: Full code, confirmed with patient Family Communication: Discussed with patient, he has no close family/acquaintances he is in contact with Disposition Plan: Pending clinical progress Consults called: None Level of care: Telemetry Admission status:  Status is: Observation  The patient remains OBS appropriate and will d/c before 2 midnights.  Dispo: The patient is from: [facility]              Anticipated d/c is to: Previous environment              Patient currently is not medically stable  to d/c.   Darreld Mclean MD [facility]  If 7PM-7AM, please contact night-coverage [email or web address]  03/02/2021, 7:06 PM

## 2021-03-02 NOTE — ED Notes (Signed)
Placed external condom catheter on patient 

## 2021-03-02 NOTE — ED Notes (Signed)
Patient stated that his leg pain is a Cytogeneticist notified

## 2021-03-02 NOTE — ED Provider Notes (Signed)
Physical Exam  BP (!) 153/97 (BP Location: Right Arm)   Pulse (!) 104   Temp 98.5 F (36.9 C)   Resp (!) 21   SpO2 100%   Physical Exam Vitals and nursing note reviewed.  Cardiovascular:     Rate and Rhythm: Tachycardia present.  Pulmonary:     Effort: Pulmonary effort is normal. Tachypnea present.  Musculoskeletal:     Right lower leg: Edema present.     Left lower leg: Edema present.     ED Course/Procedures     Procedures  MDM   Patient signed out to me by Lajuana Carry, PA-C.  Please see previous notes for further history.  In brief, patient presenting for leg pain and swelling.  This is bilateral, but worse on the right.  He reports he is compliant with all of his medication including Lasix.  He is not currently on any pain medicines.  Of note, patient has been seen multiple times for similar.  He was hospitalized in January 2022 and had bilateral great toe amputations.  While in the hospital pending psych eval, patient was evaluated by podiatry and sutures were removed.  Patient has had foot pain since surgery.  Pending labs, chest x-ray, and ultrasound.  Labs interpreted by me.  Mildly elevated troponin, however similar to previous.  Will trend.  BNP minimally elevated, however improved from previous.  D-dimer is unfortunately slightly positive, he will need CTA in the setting of tachypnea, tachycardia, and reported shortness of breath.  Patient requesting something for pain, given Neurontin and Lasix.  Legs were elevated to help with swelling.  Chest x-ray viewed and interpreted by me, no obvious large pneumonia pneumothorax, effusion.  However per radiology, right lower lobe infiltrate versus early infection.  CTA negative for PE, but shows R middle lobe pna, concern for atypical cause. covid ordered. [place] negative for DVT.   On reassessment, pt remains tachycardic and tachypneic. Crackles heard on exam. In the setting of pt being homeless with limited resources, clinical  evidence of worsening heart failure, and a new pna, pt would benefit from admission. Will call for admission.   Discussed with Dr. Allena Katz from [facility], pt to be admitted.     Results for orders placed or performed during the hospital encounter of 03/02/21  Comprehensive metabolic panel  Result Value Ref Range   Sodium 136 135 - 145 mmol/L   Potassium 3.7 3.5 - 5.1 mmol/L   Chloride 100 98 - 111 mmol/L   CO2 26 22 - 32 mmol/L   Glucose, Bld 81 70 - 99 mg/dL   BUN 14 6 - 20 mg/dL   Creatinine, Ser 0.35 0.61 - 1.24 mg/dL   Calcium 8.8 (L) 8.9 - 10.3 mg/dL   Total Protein 7.3 6.5 - 8.1 g/dL   Albumin 3.6 3.5 - 5.0 g/dL   AST 19 15 - 41 U/L   ALT 11 0 - 44 U/L   Alkaline Phosphatase 78 38 - 126 U/L   Total Bilirubin 1.0 0.3 - 1.2 mg/dL   GFR, Estimated >46 >56 mL/min   Anion gap 10 5 - 15  CBC with Differential  Result Value Ref Range   WBC 4.5 4.0 - 10.5 K/uL   RBC 4.59 4.22 - 5.81 MIL/uL   Hemoglobin 14.3 13.0 - 17.0 g/dL   HCT 81.2 75.1 - 70.0 %   MCV 95.0 80.0 - 100.0 fL   MCH 31.2 26.0 - 34.0 pg   MCHC 32.8 30.0 -  36.0 g/dL   RDW 16.1 (H) 09.6 - 04.5 %   Platelets 291 150 - 400 K/uL   nRBC 0.0 0.0 - 0.2 %   Neutrophils Relative % 47 %   Neutro Abs 2.1 1.7 - 7.7 K/uL   Lymphocytes Relative 37 %   Lymphs Abs 1.7 0.7 - 4.0 K/uL   Monocytes Relative 14 %   Monocytes Absolute 0.6 0.1 - 1.0 K/uL   Eosinophils Relative 1 %   Eosinophils Absolute 0.0 0.0 - 0.5 K/uL   Basophils Relative 1 %   Basophils Absolute 0.0 0.0 - 0.1 K/uL   Immature Granulocytes 0 %   Abs Immature Granulocytes 0.01 0.00 - 0.07 K/uL  Brain natriuretic peptide  Result Value Ref Range   B Natriuretic Peptide 424.6 (H) 0.0 - 100.0 pg/mL  D-dimer, quantitative  Result Value Ref Range   D-Dimer, Quant 0.69 (H) 0.00 - 0.50 ug/mL-FEU  Troponin I (High Sensitivity)  Result Value Ref Range   Troponin I (High Sensitivity) 36 (H) <18 ng/L  Troponin I (High Sensitivity)  Result Value Ref  Range   Troponin I (High Sensitivity) 27 (H) <18 ng/L   CT Head Wo Contrast  Result Date: 02/09/2021 CLINICAL DATA:  Stroke follow-up EXAM: CT HEAD WITHOUT CONTRAST TECHNIQUE: Contiguous axial images were obtained from the base of the skull through the vertex without intravenous contrast. COMPARISON:  CT 12/26/2020.  MRI 01/08/2021 FINDINGS: Brain: Continued evolving encephalomalacia of late subacute right superior cerebellar infarct. Chronic inferior left cerebellar infarct. No evidence of new/acute infarction. No evidence of intracranial hemorrhage or extra-axial collection. No hydrocephalus. Mild-moderate low-density changes within the periventricular and subcortical white matter compatible with chronic microvascular ischemic change. Mild diffuse cerebral volume loss. Vascular: Atherosclerotic calcifications involving the large vessels of the skull base. No unexpected hyperdense vessel. Skull: Normal. Negative for fracture or focal lesion. Sinuses/Orbits: No acute finding. Other: None. IMPRESSION: 1. Continued evolving encephalomalacia of late subacute right superior cerebellar infarct. No new/acute intracranial findings. 2. Chronic inferior left cerebellar infarct. 3. Chronic microvascular ischemic change and cerebral volume loss. Electronically Signed   By: Duanne Guess D.O.   On: 02/09/2021 16:50   CT Angio Chest PE W and/or Wo Contrast  Result Date: 03/02/2021 CLINICAL DATA:  55 year old male with positive D-dimer. Concern for pulmonary embolism. EXAM: CT ANGIOGRAPHY CHEST WITH CONTRAST TECHNIQUE: Multidetector CT imaging of the chest was performed using the standard protocol during bolus administration of intravenous contrast. Multiplanar CT image reconstructions and MIPs were obtained to evaluate the vascular anatomy. CONTRAST:  OMNIPAQUE IOHEXOL 350 MG/ML SOLN COMPARISON:  Chest radiograph dated 03/02/2021. FINDINGS: Evaluation is limited due to respiratory motion artifact as well as  streak artifact caused by patient's arms. Cardiovascular: Mild cardiomegaly. There is 3 vessel coronary vascular calcification. No pericardial effusion. There is mild atherosclerotic calcification of the thoracic aorta. Evaluation of the pulmonary arteries is limited due to respiratory motion artifact. No pulmonary artery embolus identified. Mediastinum/Nodes: Mildly enlarged right hilar lymph nodes, likely reactive. The esophagus is grossly unremarkable. No mediastinal fluid collection. Lungs/Pleura: Background of emphysema. There is faint cluster of nodularity in the right middle lobe most consistent with pneumonia, likely viral or atypical in etiology. Clinical correlation is recommended. No lobar consolidation, pleural effusion, pneumothorax. The central airways are patent. Upper Abdomen: No acute abnormality. Musculoskeletal: Degenerative changes of the spine. No acute osseous pathology. Review of the MIP images confirms the above findings. IMPRESSION: 1. No CT evidence of pulmonary embolism. 2. Right middle lobe  pneumonia, likely viral or atypical in etiology. Clinical correlation is recommended. 3. Aortic Atherosclerosis (ICD10-I70.0) and Emphysema (ICD10-J43.9). Electronically Signed   By: Elgie Collard M.D.   On: 03/02/2021 17:22   DG Chest Port 1 View  Result Date: 03/02/2021 CLINICAL DATA:  Bilateral leg pain and swelling.  Productive cough. EXAM: PORTABLE CHEST 1 VIEW COMPARISON:  November 27, 2020.  December 21, 2020 FINDINGS: There is mild prominence the hila, similar since November 27, 2020. Cardiomegaly is stable. The mediastinum is unchanged. No pneumothorax. No nodules or masses. Mild opacity in the right base is favored represent atelectasis with developing infiltrate considered less likely. No overt edema. IMPRESSION: 1. Hilar prominence is similar since comparison studies and could be technical in nature given portable imaging and normal hilar structures on the November 13, 2020 PA and  lateral chest x-ray. A PA and lateral chest x-ray could better evaluate. 2. Stable cardiomegaly. 3. Mild opacity in the right base is favored represent atelectasis. Early developing infiltrate considered less likely. 4. No overt edema. Electronically Signed   By: Gerome Sam III M.D   On: 03/02/2021 14:58   DG Foot Complete Left  Result Date: 02/19/2021 CLINICAL DATA:  Bilateral foot pain, swelling, bilateral first ray amputations 01/02/2021 EXAM: LEFT FOOT - COMPLETE 3+ VIEW; RIGHT FOOT COMPLETE - 3+ VIEW COMPARISON:  Radiograph 01/02/2021 FINDINGS: Right foot: Postsurgical changes from prior first ray amputation at the level of the metatarsophalangeal joint. Interval removal the overlying skin staples. Some mild residual soft tissue swelling is noted at the level of the amputation. Some mild cutaneous irregularity, correlate for ulceration or laceration. No soft tissue gas or foreign body. No convincing radiographic sites of osteomyelitis at this time. Stable degenerative changes in the mid and hindfoot including plantar calcaneal spur. Left foot: Postsurgical changes from prior first ray amputation at the level of the metatarsophalangeal joint. Interval removal of overlying skin staple seen previously. Some mild residual cutaneous irregularity, correlate for postsurgical change, ulceration or laceration. No soft tissue gas or foreign body is seen. No subjacent areas of osseous erosion, destructive change or other suspicious features of osteomyelitis are seen at this time. Degenerative changes in the mid and hindfoot with plantar calcaneal spurring. IMPRESSION: 1. Postsurgical changes from prior bilateral first ray amputation at the level of the metatarsophalangeal joints. 2. Bilateral residual swelling at the site of the first ray amputations with some overlying cutaneous irregularity, correlate with visual inspection. 3. No convincing radiographic evidence of osteomyelitis at this time however if there  is persisting clinical concern MR imaging could be obtained. 4. No soft tissue gas or foreign body. 5. Degenerative changes throughout the mid and hind feet with bilateral plantar calcaneal spurs. Electronically Signed   By: Kreg Shropshire M.D.   On: 02/19/2021 03:23   DG Foot Complete Right  Result Date: 02/19/2021 CLINICAL DATA:  Bilateral foot pain, swelling, bilateral first ray amputations 01/02/2021 EXAM: LEFT FOOT - COMPLETE 3+ VIEW; RIGHT FOOT COMPLETE - 3+ VIEW COMPARISON:  Radiograph 01/02/2021 FINDINGS: Right foot: Postsurgical changes from prior first ray amputation at the level of the metatarsophalangeal joint. Interval removal the overlying skin staples. Some mild residual soft tissue swelling is noted at the level of the amputation. Some mild cutaneous irregularity, correlate for ulceration or laceration. No soft tissue gas or foreign body. No convincing radiographic sites of osteomyelitis at this time. Stable degenerative changes in the mid and hindfoot including plantar calcaneal spur. Left foot: Postsurgical changes from prior first ray amputation  at the level of the metatarsophalangeal joint. Interval removal of overlying skin staple seen previously. Some mild residual cutaneous irregularity, correlate for postsurgical change, ulceration or laceration. No soft tissue gas or foreign body is seen. No subjacent areas of osseous erosion, destructive change or other suspicious features of osteomyelitis are seen at this time. Degenerative changes in the mid and hindfoot with plantar calcaneal spurring. IMPRESSION: 1. Postsurgical changes from prior bilateral first ray amputation at the level of the metatarsophalangeal joints. 2. Bilateral residual swelling at the site of the first ray amputations with some overlying cutaneous irregularity, correlate with visual inspection. 3. No convincing radiographic evidence of osteomyelitis at this time however if there is persisting clinical concern MR imaging  could be obtained. 4. No soft tissue gas or foreign body. 5. Degenerative changes throughout the mid and hind feet with bilateral plantar calcaneal spurs. Electronically Signed   By: Kreg Shropshire M.D.   On: 02/19/2021 03:23   VAS [place] LOWER EXTREMITY VENOUS (DVT) (ONLY [facility] & [facility] 7a-7p)  Result Date: 03/02/2021  Lower Venous DVT Study Indications: Edema.  Limitations: Patient refused to remove pants and was unable to recline for study, secondary to severe shortness of breath. Patient could not tolerate full compression in calves secondary to pain. Comparison Study: No prior study on file Performing Technologist: Sherren Kerns RVS  Examination Guidelines: A complete evaluation includes B-mode imaging, spectral Doppler, color Doppler, and power Doppler as needed of all accessible portions of each vessel. Bilateral testing is considered an integral part of a complete examination. Limited examinations for reoccurring indications may be performed as noted. The reflux portion of the exam is performed with the patient in reverse Trendelenburg.  +---------+---------------+---------+-----------+----------+--------------+ RIGHT    CompressibilityPhasicitySpontaneityPropertiesThrombus Aging +---------+---------------+---------+-----------+----------+--------------+ CFV                                                   Not insonated  +---------+---------------+---------+-----------+----------+--------------+ SFJ                                                   Not insonated  +---------+---------------+---------+-----------+----------+--------------+ FV Prox                                               Not insonated  +---------+---------------+---------+-----------+----------+--------------+ FV Mid                                                Not insonated  +---------+---------------+---------+-----------+----------+--------------+ FV Distal                                              Not insonated  +---------+---------------+---------+-----------+----------+--------------+ PFV  Not insonated  +---------+---------------+---------+-----------+----------+--------------+ POP      Full           Yes      Yes                                 +---------+---------------+---------+-----------+----------+--------------+ PTV      Full                                                        +---------+---------------+---------+-----------+----------+--------------+ PERO     Full                                                        +---------+---------------+---------+-----------+----------+--------------+   +---------+---------------+---------+-----------+----------+---------------+ LEFT     CompressibilityPhasicitySpontaneityPropertiesThrombus Aging  +---------+---------------+---------+-----------+----------+---------------+ CFV                                                   Not insonated   +---------+---------------+---------+-----------+----------+---------------+ SFJ                                                   Not insonated   +---------+---------------+---------+-----------+----------+---------------+ FV Prox                                               Not insonated   +---------+---------------+---------+-----------+----------+---------------+ FV Mid                                                Not insonated   +---------+---------------+---------+-----------+----------+---------------+ FV Distal                                             Not insonated   +---------+---------------+---------+-----------+----------+---------------+ PFV                                                   Not insonated   +---------+---------------+---------+-----------+----------+---------------+ POP      Full           Yes      Yes                                   +---------+---------------+---------+-----------+----------+---------------+ PTV  patent by color +---------+---------------+---------+-----------+----------+---------------+ PERO                                                  patent by color +---------+---------------+---------+-----------+----------+---------------+    Summary: RIGHT: - There is no evidence of deep vein thrombosis in the lower extremity. However, portions of this examination were limited- see technologist comments above.  significant interstitial edema noted throughout the calf  LEFT: - There is no evidence of deep vein thrombosis in the lower extremity. However, portions of this examination were limited- see technologist comments above.  - significant interstitial edema noted throughout the calf  *See table(s) above for measurements and observations.    Preliminary         Alveria Apley, PA-C 03/02/21 1906    Rolan Bucco, MD 03/03/21 [phone]

## 2021-03-02 NOTE — Progress Notes (Signed)
VASCULAR LAB    Bilateral lower extremity venous duplex has been performed.  See CV proc for preliminary results.  Gave verbal results to Alveria Apley, PA-C  Kimari Coudriet, RVT 03/02/2021, 6:01 PM

## 2021-03-03 ENCOUNTER — Other Ambulatory Visit: Payer: Self-pay

## 2021-03-03 ENCOUNTER — Encounter (HOSPITAL_COMMUNITY): Payer: Self-pay | Admitting: Internal Medicine

## 2021-03-03 DIAGNOSIS — F250 Schizoaffective disorder, bipolar type: Secondary | ICD-10-CM | POA: Diagnosis present

## 2021-03-03 DIAGNOSIS — D709 Neutropenia, unspecified: Secondary | ICD-10-CM | POA: Diagnosis present

## 2021-03-03 DIAGNOSIS — I110 Hypertensive heart disease with heart failure: Secondary | ICD-10-CM | POA: Diagnosis not present

## 2021-03-03 DIAGNOSIS — F1990 Other psychoactive substance use, unspecified, uncomplicated: Secondary | ICD-10-CM | POA: Diagnosis not present

## 2021-03-03 DIAGNOSIS — R778 Other specified abnormalities of plasma proteins: Secondary | ICD-10-CM

## 2021-03-03 DIAGNOSIS — Z5900 Homelessness unspecified: Secondary | ICD-10-CM

## 2021-03-03 DIAGNOSIS — Z89412 Acquired absence of left great toe: Secondary | ICD-10-CM | POA: Diagnosis not present

## 2021-03-03 DIAGNOSIS — F1414 Cocaine abuse with cocaine-induced mood disorder: Secondary | ICD-10-CM | POA: Diagnosis present

## 2021-03-03 DIAGNOSIS — J9811 Atelectasis: Secondary | ICD-10-CM | POA: Diagnosis present

## 2021-03-03 DIAGNOSIS — I5043 Acute on chronic combined systolic (congestive) and diastolic (congestive) heart failure: Secondary | ICD-10-CM

## 2021-03-03 DIAGNOSIS — J441 Chronic obstructive pulmonary disease with (acute) exacerbation: Secondary | ICD-10-CM | POA: Diagnosis present

## 2021-03-03 DIAGNOSIS — K219 Gastro-esophageal reflux disease without esophagitis: Secondary | ICD-10-CM | POA: Diagnosis present

## 2021-03-03 DIAGNOSIS — F3162 Bipolar disorder, current episode mixed, moderate: Secondary | ICD-10-CM

## 2021-03-03 DIAGNOSIS — I2584 Coronary atherosclerosis due to calcified coronary lesion: Secondary | ICD-10-CM

## 2021-03-03 DIAGNOSIS — I248 Other forms of acute ischemic heart disease: Secondary | ICD-10-CM | POA: Diagnosis present

## 2021-03-03 DIAGNOSIS — Z91010 Allergy to peanuts: Secondary | ICD-10-CM | POA: Diagnosis not present

## 2021-03-03 DIAGNOSIS — Z89411 Acquired absence of right great toe: Secondary | ICD-10-CM | POA: Diagnosis not present

## 2021-03-03 DIAGNOSIS — I69322 Dysarthria following cerebral infarction: Secondary | ICD-10-CM | POA: Diagnosis not present

## 2021-03-03 DIAGNOSIS — I2510 Atherosclerotic heart disease of native coronary artery without angina pectoris: Secondary | ICD-10-CM | POA: Diagnosis present

## 2021-03-03 DIAGNOSIS — E785 Hyperlipidemia, unspecified: Secondary | ICD-10-CM | POA: Diagnosis present

## 2021-03-03 DIAGNOSIS — F17200 Nicotine dependence, unspecified, uncomplicated: Secondary | ICD-10-CM

## 2021-03-03 DIAGNOSIS — R5381 Other malaise: Secondary | ICD-10-CM

## 2021-03-03 DIAGNOSIS — Z813 Family history of other psychoactive substance abuse and dependence: Secondary | ICD-10-CM | POA: Diagnosis not present

## 2021-03-03 DIAGNOSIS — Z8673 Personal history of transient ischemic attack (TIA), and cerebral infarction without residual deficits: Secondary | ICD-10-CM

## 2021-03-03 DIAGNOSIS — Z6828 Body mass index (BMI) 28.0-28.9, adult: Secondary | ICD-10-CM | POA: Diagnosis not present

## 2021-03-03 DIAGNOSIS — I10 Essential (primary) hypertension: Secondary | ICD-10-CM

## 2021-03-03 DIAGNOSIS — Z91040 Latex allergy status: Secondary | ICD-10-CM | POA: Diagnosis not present

## 2021-03-03 DIAGNOSIS — F1410 Cocaine abuse, uncomplicated: Secondary | ICD-10-CM

## 2021-03-03 DIAGNOSIS — M79605 Pain in left leg: Secondary | ICD-10-CM | POA: Diagnosis present

## 2021-03-03 DIAGNOSIS — Z20822 Contact with and (suspected) exposure to covid-19: Secondary | ICD-10-CM | POA: Diagnosis present

## 2021-03-03 DIAGNOSIS — E663 Overweight: Secondary | ICD-10-CM | POA: Diagnosis present

## 2021-03-03 DIAGNOSIS — M792 Neuralgia and neuritis, unspecified: Secondary | ICD-10-CM

## 2021-03-03 DIAGNOSIS — M79604 Pain in right leg: Secondary | ICD-10-CM | POA: Diagnosis present

## 2021-03-03 DIAGNOSIS — F17210 Nicotine dependence, cigarettes, uncomplicated: Secondary | ICD-10-CM | POA: Diagnosis present

## 2021-03-03 DIAGNOSIS — R7989 Other specified abnormal findings of blood chemistry: Secondary | ICD-10-CM

## 2021-03-03 LAB — CBC
HCT: 36.8 % — ABNORMAL LOW (ref 39.0–52.0)
Hemoglobin: 12.2 g/dL — ABNORMAL LOW (ref 13.0–17.0)
MCH: 31.9 pg (ref 26.0–34.0)
MCHC: 33.2 g/dL (ref 30.0–36.0)
MCV: 96.1 fL (ref 80.0–100.0)
Platelets: 263 10*3/uL (ref 150–400)
RBC: 3.83 MIL/uL — ABNORMAL LOW (ref 4.22–5.81)
RDW: 17.8 % — ABNORMAL HIGH (ref 11.5–15.5)
WBC: 3.3 10*3/uL — ABNORMAL LOW (ref 4.0–10.5)
nRBC: 0 % (ref 0.0–0.2)

## 2021-03-03 LAB — BASIC METABOLIC PANEL
Anion gap: 10 (ref 5–15)
BUN: 14 mg/dL (ref 6–20)
CO2: 25 mmol/L (ref 22–32)
Calcium: 8.7 mg/dL — ABNORMAL LOW (ref 8.9–10.3)
Chloride: 105 mmol/L (ref 98–111)
Creatinine, Ser: 1.07 mg/dL (ref 0.61–1.24)
GFR, Estimated: >60 mL/min (ref >60)
Glucose, Bld: 95 mg/dL (ref 70–99)
Potassium: 3.8 mmol/L (ref 3.5–5.1)
Sodium: 140 mmol/L (ref 135–145)

## 2021-03-03 LAB — MAGNESIUM: Magnesium: 1.9 mg/dL (ref 1.7–2.4)

## 2021-03-03 LAB — PROCALCITONIN: Procalcitonin: <0.1 ng/mL

## 2021-03-03 LAB — ETHANOL: Alcohol, Ethyl (B): <10 mg/dL (ref <10)

## 2021-03-03 MED ORDER — LORAZEPAM 2 MG/ML IJ SOLN
0.5000 mg | Freq: Once | INTRAMUSCULAR | Status: AC
  Administered 2021-03-03: 0.5 mg via INTRAVENOUS
  Filled 2021-03-03: qty 1

## 2021-03-03 MED ORDER — ISOSORBIDE DINITRATE 5 MG PO TABS
5.0000 mg | ORAL_TABLET | Freq: Three times a day (TID) | ORAL | Status: DC
  Administered 2021-03-03: 5 mg via ORAL
  Filled 2021-03-03 (×2): qty 1

## 2021-03-03 MED ORDER — TRAZODONE HCL 50 MG PO TABS
50.0000 mg | ORAL_TABLET | Freq: Every day | ORAL | Status: DC
  Filled 2021-03-03 (×6): qty 1

## 2021-03-03 MED ORDER — MAGNESIUM SULFATE 2 GM/50ML IV SOLN
2.0000 g | Freq: Once | INTRAVENOUS | Status: AC
  Administered 2021-03-03: 2 g via INTRAVENOUS
  Filled 2021-03-03: qty 50

## 2021-03-03 MED ORDER — IPRATROPIUM-ALBUTEROL 0.5-2.5 (3) MG/3ML IN SOLN
3.0000 mL | Freq: Four times a day (QID) | RESPIRATORY_TRACT | Status: DC
  Administered 2021-03-03 (×2): 3 mL via RESPIRATORY_TRACT
  Filled 2021-03-03 (×2): qty 3

## 2021-03-03 MED ORDER — METHOCARBAMOL 500 MG PO TABS
750.0000 mg | ORAL_TABLET | Freq: Three times a day (TID) | ORAL | Status: DC | PRN
  Administered 2021-03-03: 750 mg via ORAL
  Filled 2021-03-03: qty 2

## 2021-03-03 MED ORDER — ISOSORBIDE MONONITRATE ER 30 MG PO TB24
15.0000 mg | ORAL_TABLET | Freq: Every day | ORAL | Status: DC
  Administered 2021-03-04 – 2021-03-12 (×7): 15 mg via ORAL
  Filled 2021-03-03 (×9): qty 1

## 2021-03-03 MED ORDER — ASENAPINE MALEATE 5 MG SL SUBL
5.0000 mg | SUBLINGUAL_TABLET | Freq: Two times a day (BID) | SUBLINGUAL | Status: DC | PRN
  Filled 2021-03-03: qty 1

## 2021-03-03 MED ORDER — HYDRALAZINE HCL 25 MG PO TABS
25.0000 mg | ORAL_TABLET | Freq: Three times a day (TID) | ORAL | Status: DC
  Administered 2021-03-03 – 2021-03-12 (×20): 25 mg via ORAL
  Filled 2021-03-03 (×22): qty 1

## 2021-03-03 NOTE — Evaluation (Signed)
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  Clinical Impression  Patient is homeless, reports using a wheelchair at baseline. Is very discouraged that had to use bed pan for toileting "someone else is wiping my butt" with patient expressing as soon as therapy walks in the door "I want to go to rehab." Currently patient is limited by significant R LE pain limiting his overall activity tolerance, balance and safety. Patient needed significantly increased time and mod A x2 for safety to perform bed mobility. Patient appears to become more anxious and restless with bed mobility at one point starts breathing heavily "get the nurse I can't breathe!" Nursing staff arrive with patient saturating at 99%, cue patient in deep breathing and provide encouragement to calm patient down. Once positioned at edge of bed patient reports too much pain in dependent position and needed increased time and mod x2 to return to semi-supine. Recommend continued acute PT services to maximize overall endurance, balance, safety awareness in order to facilitate D/C to venue listed below.    Follow Up Recommendations CIR    Equipment Recommendations  None recommended by PT    Recommendations for Other Services       Precautions / Restrictions Precautions Precautions: Fall Precaution Comments: can become easily agitated, likes step by step instruction at his own pace Restrictions Weight Bearing Restrictions: No      Mobility  Bed Mobility Overal bed  mobility: Needs Assistance Bed Mobility: Supine to Sit;Sit to Supine     Supine to sit: Mod assist;+2 for safety/equipment Sit to supine: Mod assist;+2 for safety/equipment;+2 for physical assistance;HOB elevated   General bed mobility comments: patient needing significantly increased time to perform all bed mobility due to R LE pain. Becomes increasingly anxious/restless with mobility especially if not at his pace. Pt needing assist to manage LEs to EOB as well as min A at trunk level. Once positioned EOB reports too much pain in R LE in dependent position, needing increased time and assist to bring LEs back into bed    Transfers                 General transfer comment: unable 2* pain  Ambulation/Gait                Stairs            Wheelchair Mobility    Modified Rankin (Stroke Patients Only)       Balance Overall balance assessment: Needs assistance Sitting-balance support: Bilateral upper extremity supported Sitting balance-Leahy Scale: Poor                                       Pertinent Vitals/Pain Pain Assessment: Faces Faces Pain Scale: Hurts whole lot Pain Location: R LE, describes knee to foot however also grabbing his R hip/thigh Pain Descriptors / Indicators: Grimacing;Guarding Pain Intervention(s): Limited activity within patient's tolerance;Monitored during session;Patient requesting pain meds-RN notified    Home Living Family/patient expects to be discharged to:: Shelter/Homeless  Prior Function Level of Independence: Independent with assistive device(s)         Comments: patient reports using a wheelchair     Hand Dominance   Dominant Hand: Right    Extremity/Trunk Assessment   Upper Extremity Assessment Upper Extremity Assessment: RUE deficits/detail RUE Deficits / Details: R UE ataxia RUE Coordination: decreased fine motor;decreased gross motor    Lower Extremity  Assessment Lower Extremity Assessment: RLE deficits/detail RLE: Unable to fully assess due to pain       Communication   Communication: Expressive difficulties  Cognition Arousal/Alertness: Awake/alert Behavior During Therapy: Restless;Anxious;Impulsive;Agitated Overall Cognitive Status: No family/caregiver present to determine baseline cognitive functioning                                 General Comments: difficult to understand speech at times due to expressive difficulties hx CVA, perseverating on his pain in R leg repeatedly telling therapists. if start to go too fast for him becomes upset      General Comments      Exercises     Assessment/Plan    PT Assessment Patient needs continued PT services  PT Problem List Decreased activity tolerance;Decreased balance;Decreased mobility;Decreased coordination;Decreased strength;Decreased safety awareness;Decreased cognition;Decreased knowledge of use of DME;Decreased knowledge of precautions       PT Treatment Interventions DME instruction;Gait training;Functional mobility training;Therapeutic activities;Balance training;Patient/family education;Therapeutic exercise;Neuromuscular re-education;Cognitive remediation    PT Goals (Current goals can be found in the Care Plan section)  Acute Rehab PT Goals Patient Stated Goal: patient wanting to go to rehab PT Goal Formulation: With patient Time For Goal Achievement: 03/17/21 Potential to Achieve Goals: Fair    Frequency Min 3X/week   Barriers to discharge Decreased caregiver support;Inaccessible home environment      Co-evaluation PT/OT/SLP Co-Evaluation/Treatment: Yes Reason for Co-Treatment: For patient/therapist safety PT goals addressed during session: Mobility/safety with mobility OT goals addressed during session: ADL's and self-care       AM-PAC PT "6 Clicks" Mobility  Outcome Measure Help needed turning from your back to your side while in a flat  bed without using bedrails?: None Help needed moving from lying on your back to sitting on the side of a flat bed without using bedrails?: A Lot Help needed moving to and from a bed to a chair (including a wheelchair)?: Total Help needed standing up from a chair using your arms (e.g., wheelchair or bedside chair)?: Total Help needed to walk in hospital room?: Total Help needed climbing 3-5 steps with a railing? : Total 6 Click Score: 10    End of Session   Activity Tolerance: Patient limited by pain Patient left: in bed;with call bell/phone within reach;with nursing/sitter in room Nurse Communication: Mobility status PT Visit Diagnosis: Other abnormalities of gait and mobility (R26.89);Other symptoms and signs involving the nervous system (R29.898);Difficulty in walking, not elsewhere classified (R26.2)    Time: 4008-6761 PT Time Calculation (min) (ACUTE ONLY): 30 min   Charges:   PT Evaluation $PT Eval Moderate Complexity: 1 Mod          Mauro Kaufmann PT Acute Rehabilitation Services Pager [phone] Office [phone]   Cameran Ahmed 03/03/2021, 12:52 PM

## 2021-03-03 NOTE — Progress Notes (Signed)
PROGRESS NOTE  [name] SWF:[id number] DOB: 1966/02/01   PCP: [name]  Patient is from: [place], [facility].  Reported using wheelchair at baseline.  DOA: 03/02/2021 LOS: 0  Chief complaints: Shortness of breath, orthopnea and leg swelling and pain  Brief Narrative / Interim history: 55 year old M with PMH of combined CHF, cerebellar CVA with dysarthria, CAD, COPD, HTN, HLD, bipolar disorder, aggressive behavior, neuropathy, cocaine use, tobacco use, recent hospitalization from 1/17-3/6 for cerebellar CVA, CHF exacerbation and bilateral great toe osteomyelitis presenting with shortness of breath, orthopnea, edema and lower extremity pain, and admitted for acute on chronic combined CHF and possible RML pneumonia.  Patient has had 5 ED visits including this since his discharged on 3/6.  In ED, slightly tachycardic and hypertensive.  Normal saturation.  BNP, troponin and D-dimer slightly elevated. CTA and LE [place] negative for VTE.  Started on IV Lasix and antibiotics.  Pro-Cal negative.  Cefdinir discontinued.  Subjective: Seen and examined earlier this morning.  No major events overnight.  Patient became restless, anxious, and "short of breath" when trying to work with therapy.  Complaining of bilateral lower extremity pain below his knee.  He was not able to describe his pain partly due to severe dysarthria.  Vital signs and saturation within normal.  Objective: Vitals:   03/02/21 1715 03/02/21 2006 03/03/21 0547 03/03/21 0917  BP: (!) 153/97 (!) 159/104 131/86   Pulse: (!) 104 97 89   Resp: (!) 21 20 20    Temp:  97.9 F (36.6 C) 99.4 F (37.4 C)   TempSrc:  Oral Oral   SpO2: 100% 100% 96% 100%    Intake/Output Summary (Last 24 hours) at 03/03/2021 1219 Last data filed at 03/03/2021 0700 Gross per 24 hour  Intake --  Output 850 ml  Net -850 ml   There were no vitals filed for this visit.  Examination:  GENERAL: Restless in bed. HEENT: MMM.  Vision and  hearing grossly intact.  NECK: Supple.  No apparent JVD.  RESP: 99% on 2 L.  No IWOB.  Fair aeration bilaterally. CVS:  RRR. Heart sounds normal.  ABD/GI/GU: BS+. Abd soft, NTND.  MSK/EXT:  Moves extremities.  Bilateral great toe amputations.  1+ edema.  Patient did not let me examine his legs attributing to severe pain. SKIN: Dry scabs over bilateral foot from recent bilateral great amputation NEURO: Awake and alert.  Seems to be oriented.  Significant dysarthria.  No apparent focal neuro deficit but limited exam as patient was restless, anxious and not cooperative. PSYCH: Restless, anxious, uncooperative.  Seems to have labile moods. Get easily agitated and aggressive, then quickly becomes calm and to start thanking staff  Procedures:  None  Microbiology summarized: COVID-19 and influenza PCR nonreactive.  Assessment & Plan: Acute on chronic combined systolic and diastolic CHF: TTE on 1/18 with LVEF of 20%, GH, G2-DD, moderately reduced RVSF, moderate LAE and RAE.  Presents with SOB, orthopnea, DOE and edema.  BNP elevated but less than prior values. CTA and LE 2/18 negative for DVT.  Still with ongoing cocaine and tobacco use.  Had about 850 cc UOP overnight.  Creatinine is stable.  Still with dyspnea and edema. -Continue IV Lasix 40 mg twice daily -Closely monitor fluid status, renal functions and electrolytes -Continue Coreg, Entresto and Aldactone.  Resume hydralazine.  Change Isordil to Imdur.  -Encouraged to quit using cocaine and smoking cigarettes.  RML pneumonia: Unlikely.  Procalcitonin negative.  -Discontinue cefdinir -Continue doxycycline given some degree  of COPD exacerbation -Continue breathing treatments  COPD with mild exacerbation: -Doxycycline as above -Scheduled DuoNeb with as needed  History of right and left cerebellar ischemic stroke with residual dysarthria: -Continue aspirin and atorvastatin.  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  No evidence of infection apart from poor hygiene.  Elevated troponin/CAD: Elevated troponin likely demand ischemia.  No chest pain. -Continue cardiac meds, aspirin and a statin as above  Elevated D-dimer: CTA and LE US negative for VTE.  Hypertension: SBP slightly elevated. -Cardiac meds and diuretics as above  Bipolar disorder/aggressive behavior/restlessness: Seems to have labile mood where he gets aggressive and impulsive.  Per recent discharge summary, he threatened staffs felt to be unsafe for others and discharged from the hospital after he was medically cleared.  Psychiatry did not feel there is indication for inpatient psych at that time. -Continue Depakote. -Consult psychiatry  History of suspected seizure activity: -Continue Keppra.  Hyperlipidemia: -Continue atorvastatin. -Check CK  Substance use disorder/tobacco use disorder/cocaine use-admitted use of cocaine and tobacco -Check UDS -Encouraged to stop using cocaine and smoking cigarettes -Continue nicotine patch  Bilateral lower extremity pain/neuropathy -Continue home gabapentin -Add as needed Robaxin for possible muscle spasm  Generalized weakness/debility-reportedly uses wheelchair.  Per discharge summary on 3/6, patient was able to ambulate from his room to nursing station prior to discharge. -PT/OT consult  Leukopenia: New.  Unclear etiology. -Recheck in the morning  Homeless: -TOC consulted    There is no height or weight on file to calculate BMI.         DVT prophylaxis:  enoxaparin (LOVENOX) injection 40 mg Start: 03/02/21 2200  Code Status: Full code Family Communication: patient has no close family/acquaintances  Level of care: Telemetry Status is: Observation.  Remains inpatient appropriate because:Unsafe d/c plan, IV treatments appropriate due to intensity of illness or inability to take PO and Inpatient level of care  appropriate due to severity of illness   Dispo: The patient is from: Homeless              Anticipated d/c is to: To be determined              Patient currently is not medically stable to d/c.   Difficult to place patient No       Consultants:  Psychiatry   Sch Meds:  Scheduled Meds: . aspirin  81 mg Oral Daily  . atorvastatin  40 mg Oral Daily  . carvedilol  3.125 mg Oral BID WC  . divalproex  500 mg Oral BID  . doxycycline  100 mg Oral Q12H  . enoxaparin (LOVENOX) injection  40 mg Subcutaneous Q24H  . furosemide  40 mg Intravenous BID  . gabapentin  600 mg Oral TID  . guaiFENesin  600 mg Oral BID  . hydrALAZINE  25 mg Oral Q8H  . ipratropium-albuterol  3 mL Nebulization Q6H  . isosorbide dinitrate  5 mg Oral TID  . [START ON 03/04/2021] isosorbide mononitrate  15 mg Oral Daily  . levETIRAcetam  500 mg Oral BID  . magnesium oxide  400 mg Oral BID  . nicotine  21 mg Transdermal Daily  . potassium chloride  20 mEq Oral Daily  . sacubitril-valsartan  1 tablet Oral BID  . spironolactone  12.5 mg Oral Daily  . traZODone  50 mg Oral QHS   Continuous Infusions: PRN Meds:.albuterol, ipratropium-albuterol, methocarbamol  Antimicrobials: Anti-infectives (From admission, onward)   Start  Dose/Rate Route Frequency Ordered Stop   03/02/21 2200  doxycycline (VIBRA-TABS) tablet 100 mg        100 mg Oral Every 12 hours 03/02/21 1954 03/07/21 2159   03/02/21 2200  cefdinir (OMNICEF) capsule 300 mg  Status:  Discontinued        300 mg Oral Every 12 hours 03/02/21 1954 03/03/21 1014       I have personally reviewed the following labs and images: CBC: Recent Labs  Lab 03/02/21 1530 03/03/21 0426  WBC 4.5 3.3*  NEUTROABS 2.1  --   HGB 14.3 12.2*  HCT 43.6 36.8*  MCV 95.0 96.1  PLT 291 263   BMP &GFR Recent Labs  Lab 03/02/21 1530 03/03/21 0426  NA 136 140  K 3.7 3.8  CL 100 105  CO2 26 25  GLUCOSE 81 95  BUN 14 14  CREATININE 1.11 1.07  CALCIUM 8.8* 8.7*   MG  --  1.9   Estimated Creatinine Clearance: 87.8 mL/min (by C-G formula based on SCr of 1.07 mg/dL). Liver & Pancreas: Recent Labs  Lab 03/02/21 1530  AST 19  ALT 11  ALKPHOS 78  BILITOT 1.0  PROT 7.3  ALBUMIN 3.6   No results for input(s): LIPASE, AMYLASE in the last 168 hours. No results for input(s): AMMONIA in the last 168 hours. Diabetic: No results for input(s): HGBA1C in the last 72 hours. No results for input(s): GLUCAP in the last 168 hours. Cardiac Enzymes: No results for input(s): CKTOTAL, CKMB, CKMBINDEX, TROPONINI in the last 168 hours. No results for input(s): PROBNP in the last 8760 hours. Coagulation Profile: No results for input(s): INR, PROTIME in the last 168 hours. Thyroid Function Tests: No results for input(s): TSH, T4TOTAL, FREET4, T3FREE, THYROIDAB in the last 72 hours. Lipid Profile: No results for input(s): CHOL, HDL, LDLCALC, TRIG, CHOLHDL, LDLDIRECT in the last 72 hours. Anemia Panel: No results for input(s): VITAMINB12, FOLATE, FERRITIN, TIBC, IRON, RETICCTPCT in the last 72 hours. Urine analysis:    Component Value Date/Time   COLORURINE AMBER (A) 02/09/2021 1455   APPEARANCEUR CLEAR 02/09/2021 1455   LABSPEC 1.030 02/09/2021 1455   PHURINE 5.0 02/09/2021 1455   GLUCOSEU NEGATIVE 02/09/2021 1455   HGBUR NEGATIVE 02/09/2021 1455   BILIRUBINUR NEGATIVE 02/09/2021 1455   KETONESUR 5 (A) 02/09/2021 1455   PROTEINUR 30 (A) 02/09/2021 1455   NITRITE NEGATIVE 02/09/2021 1455   LEUKOCYTESUR NEGATIVE 02/09/2021 1455   Sepsis Labs: Invalid input(s): PROCALCITONIN, LACTICIDVEN  Microbiology: Recent Results (from the past 240 hour(s))  Resp Panel by RT-PCR (Flu A&B, Covid) Nasopharyngeal Swab     Status: None   Collection Time: 03/02/21  6:30 PM   Specimen: Nasopharyngeal Swab; Nasopharyngeal(NP) swabs in vial transport medium  Result Value Ref Range Status   SARS Coronavirus 2 by RT PCR NEGATIVE NEGATIVE Final    Comment:  (NOTE) SARS-CoV-2 target nucleic acids are NOT DETECTED.  The SARS-CoV-2 RNA is generally detectable in upper respiratory specimens during the acute phase of infection. The lowest concentration of SARS-CoV-2 viral copies this assay can detect is 138 copies/mL. A negative result does not preclude SARS-Cov-2 infection and should not be used as the sole basis for treatment or other patient management decisions. A negative result may occur with  improper specimen collection/handling, submission of specimen other than nasopharyngeal swab, presence of viral mutation(s) within the areas targeted by this assay, and inadequate number of viral copies(<138 copies/mL). A negative result must be combined with clinical observations, patient history,  and epidemiological information. The expected result is Negative.  Fact Sheet for Patients:  BloggerCourse.com  Fact Sheet for Healthcare Providers:  SeriousBroker.it  This test is no t yet approved or cleared by the [place] FDA and  has been authorized for detection and/or diagnosis of SARS-CoV-2 by FDA under an Emergency Use Authorization (EUA). This EUA will remain  in effect (meaning this test can be used) for the duration of the COVID-19 declaration under Section 564(b)(1) of the Act, 21 U.S.C.section 360bbb-3(b)(1), unless the authorization is terminated  or revoked sooner.       Influenza A by PCR NEGATIVE NEGATIVE Final   Influenza B by PCR NEGATIVE NEGATIVE Final    Comment: (NOTE) The Xpert Xpress SARS-CoV-2/FLU/RSV plus assay is intended as an aid in the diagnosis of influenza from Nasopharyngeal swab specimens and should not be used as a sole basis for treatment. Nasal washings and aspirates are unacceptable for Xpert Xpress SARS-CoV-2/FLU/RSV testing.  Fact Sheet for Patients: BloggerCourse.com  Fact Sheet for Healthcare  Providers: SeriousBroker.it  This test is not yet approved or cleared by the [place] FDA and has been authorized for detection and/or diagnosis of SARS-CoV-2 by FDA under an Emergency Use Authorization (EUA). This EUA will remain in effect (meaning this test can be used) for the duration of the COVID-19 declaration under Section 564(b)(1) of the Act, 21 U.S.C. section 360bbb-3(b)(1), unless the authorization is terminated or revoked.  Performed at [facility], [address] [place], [name] [address]     Radiology Studies: CT Angio Chest PE W and/or Wo Contrast  Result Date: 03/02/2021 CLINICAL DATA:  55 year old male with positive D-dimer. Concern for pulmonary embolism. EXAM: CT ANGIOGRAPHY CHEST WITH CONTRAST TECHNIQUE: Multidetector CT imaging of the chest was performed using the standard protocol during bolus administration of intravenous contrast. Multiplanar CT image reconstructions and MIPs were obtained to evaluate the vascular anatomy. CONTRAST:  OMNIPAQUE IOHEXOL 350 MG/ML SOLN COMPARISON:  Chest radiograph dated 03/02/2021. FINDINGS: Evaluation is limited due to respiratory motion artifact as well as streak artifact caused by patient's arms. Cardiovascular: Mild cardiomegaly. There is 3 vessel coronary vascular calcification. No pericardial effusion. There is mild atherosclerotic calcification of the thoracic aorta. Evaluation of the pulmonary arteries is limited due to respiratory motion artifact. No pulmonary artery embolus identified. Mediastinum/Nodes: Mildly enlarged right hilar lymph nodes, likely reactive. The esophagus is grossly unremarkable. No mediastinal fluid collection. Lungs/Pleura: Background of emphysema. There is faint cluster of nodularity in the right middle lobe most consistent with pneumonia, likely viral or atypical in etiology. Clinical correlation is recommended. No lobar consolidation, pleural effusion,  pneumothorax. The central airways are patent. Upper Abdomen: No acute abnormality. Musculoskeletal: Degenerative changes of the spine. No acute osseous pathology. Review of the MIP images confirms the above findings. IMPRESSION: 1. No CT evidence of pulmonary embolism. 2. Right middle lobe pneumonia, likely viral or atypical in etiology. Clinical correlation is recommended. 3. Aortic Atherosclerosis (ICD10-I70.0) and Emphysema (ICD10-J43.9). Electronically Signed   By: Elgie Collard M.D.   On: 03/02/2021 17:22   DG Chest Port 1 View  Result Date: 03/02/2021 CLINICAL DATA:  Bilateral leg pain and swelling.  Productive cough. EXAM: PORTABLE CHEST 1 VIEW COMPARISON:  November 27, 2020.  December 21, 2020 FINDINGS: There is mild prominence the hila, similar since November 27, 2020. Cardiomegaly is stable. The mediastinum is unchanged. No pneumothorax. No nodules or masses. Mild opacity in the right base is favored represent atelectasis with developing infiltrate  considered less likely. No overt edema. IMPRESSION: 1. Hilar prominence is similar since comparison studies and could be technical in nature given portable imaging and normal hilar structures on the November 13, 2020 PA and lateral chest x-ray. A PA and lateral chest x-ray could better evaluate. 2. Stable cardiomegaly. 3. Mild opacity in the right base is favored represent atelectasis. Early developing infiltrate considered less likely. 4. No overt edema. Electronically Signed   By: Gerome Sam III M.D   On: 03/02/2021 14:58   VAS [place] LOWER EXTREMITY VENOUS (DVT) (ONLY [facility] & [facility] 7a-7p)  Result Date: 03/03/2021  Lower Venous DVT Study Indications: Edema.  Limitations: Patient refused to remove pants and was unable to recline for study, secondary to severe shortness of breath. Patient could not tolerate full compression in calves secondary to pain. Comparison Study: No prior study on file Performing Technologist: Sherren Kerns RVS  Examination  Guidelines: A complete evaluation includes B-mode imaging, spectral Doppler, color Doppler, and power Doppler as needed of all accessible portions of each vessel. Bilateral testing is considered an integral part of a complete examination. Limited examinations for reoccurring indications may be performed as noted. The reflux portion of the exam is performed with the patient in reverse Trendelenburg.  +---------+---------------+---------+-----------+----------+--------------+ RIGHT    CompressibilityPhasicitySpontaneityPropertiesThrombus Aging +---------+---------------+---------+-----------+----------+--------------+ CFV                                                   Not insonated  +---------+---------------+---------+-----------+----------+--------------+ SFJ                                                   Not insonated  +---------+---------------+---------+-----------+----------+--------------+ FV Prox                                               Not insonated  +---------+---------------+---------+-----------+----------+--------------+ FV Mid                                                Not insonated  +---------+---------------+---------+-----------+----------+--------------+ FV Distal                                             Not insonated  +---------+---------------+---------+-----------+----------+--------------+ PFV                                                   Not insonated  +---------+---------------+---------+-----------+----------+--------------+ POP      Full           Yes      Yes                                 +---------+---------------+---------+-----------+----------+--------------+  PTV      Full                                                        +---------+---------------+---------+-----------+----------+--------------+ PERO     Full                                                         +---------+---------------+---------+-----------+----------+--------------+   +---------+---------------+---------+-----------+----------+---------------+ LEFT     CompressibilityPhasicitySpontaneityPropertiesThrombus Aging  +---------+---------------+---------+-----------+----------+---------------+ CFV                                                   Not insonated   +---------+---------------+---------+-----------+----------+---------------+ SFJ                                                   Not insonated   +---------+---------------+---------+-----------+----------+---------------+ FV Prox                                               Not insonated   +---------+---------------+---------+-----------+----------+---------------+ FV Mid                                                Not insonated   +---------+---------------+---------+-----------+----------+---------------+ FV Distal                                             Not insonated   +---------+---------------+---------+-----------+----------+---------------+ PFV                                                   Not insonated   +---------+---------------+---------+-----------+----------+---------------+ POP      Full           Yes      Yes                                  +---------+---------------+---------+-----------+----------+---------------+ PTV                                                   patent by color +---------+---------------+---------+-----------+----------+---------------+ PERO  patent by color +---------+---------------+---------+-----------+----------+---------------+     Summary: RIGHT: - There is no evidence of deep vein thrombosis in the lower extremity. However, portions of this examination were limited- see technologist comments above.  significant interstitial edema noted throughout the calf  LEFT: - There is no  evidence of deep vein thrombosis in the lower extremity. However, portions of this examination were limited- see technologist comments above.  - significant interstitial edema noted throughout the calf  *See table(s) above for measurements and observations. Electronically signed by Sherald Hess MD on 03/03/2021 at 11:41:25 AM.    Final        Manpreet Strey T. Sylina Henion [facility]  If 7PM-7AM, please contact night-coverage [email or web address] 03/03/2021, 12:19 PM

## 2021-03-03 NOTE — Progress Notes (Addendum)
Before pulling patients PM meds I checked to see if he needed me to bring him anything back. Pt stated "don't come back, I'm not taking nothing". Educated pt on importance of multiple meds  and he said "I don't care". Pt also states, "I don't t want the people in black(RT) to come back in here." Pt refused all PM meds which includes multiple meds from "red med list". Educated pt and still refused. Pt also refused NT to take his vital signs and for writer to do assessment. Md notified.

## 2021-03-03 NOTE — Consult Note (Signed)
[facility] Face-to-Face Psychiatry Consult   Reason for Consult: ''Aggressive behavior''   Referring Physician:  Candelaria Stagers, MD Patient Identification: [name] MRN:  [id number] Principal Diagnosis: Acute on chronic combined systolic (congestive) and diastolic (congestive) heart failure (HCC) Diagnosis:  Principal Problem:   Acute on chronic combined systolic (congestive) and diastolic (congestive) heart failure (HCC) Active Problems:   Cocaine abuse with cocaine-induced mood disorder (HCC)   Schizoaffective disorder, bipolar type (HCC)   Hyperlipidemia   CAD (coronary artery disease)   HTN (hypertension)   History of stroke   Substance use disorder   Total Time spent with patient: 1 hour  Subjective:   [name] is a 55 y.o. male patient admitted with shortness of breath, orthopnea and leg swelling and pain  HPI:  55 year old Male  with PMH of combined CHF, cerebellar CVA with dysarthria, CAD, COPD, HTN, HLD, neuropathy, cocaine use, tobacco use, Bipolar disorder, aggressive behavior recent hospitalization from 1/17-3/6 for cerebellar CVA, CHF exacerbation and bilateral great toe osteomyelitis presenting with shortness of breath, orthopnea, edema and lower extremity pain, and admitted for acute on chronic combined CHF and possible RML pneumonia. Patient partially cooperative, he is irritable and easily agitated. He reports being homeless, as a result not compliant with his medications but often self medicate by using cocaine. He also reports that is major problem is his leg swelling and pain, not psychiatric issues : '' I know when I need help for my mental issues but now I want my leg pain treated.'' He denies psychosis, delusions, depression and self harming thoughts.   Past Psychiatric History: as above  Risk to Self:  denies Risk to Others:  denies Prior Inpatient Therapy:   Prior Outpatient Therapy:  non-compliant with outpatient follow up  Past Medical History:  Past  Medical History:  Diagnosis Date  . Asthma   . Bipolar 1 disorder (HCC)   . CHF (congestive heart failure) (HCC)   . COPD (chronic obstructive pulmonary disease) (HCC)   . Depression   . Diabetes mellitus without complication (HCC)   . GERD (gastroesophageal reflux disease)   . Homelessness   . Hypertension   . QT prolongation   . Schizoaffective disorder [facility])     Past Surgical History:  Procedure Laterality Date  . 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 IR ANGIO INTRA EXTRACRAN SEL COM CAROTID INNOMINATE BILAT MOD SED  12/17/2020  . IR ANGIO VERTEBRAL SEL VERTEBRAL BILAT MOD SED  12/17/2020  . RADIOLOGY WITH ANESTHESIA N/A 12/17/2020   Procedure: IR WITH ANESTHESIA;  Surgeon: Radiologist, Medication, MD;  Location: [facility] OR;  Service: Radiology;  Laterality: N/A;   Family History:  Family History  Problem Relation Age of Onset  . Drug abuse Mother   . Suicidality Mother    Family Psychiatric  History:  Social History:  Social History   Substance and Sexual Activity  Alcohol Use Yes   Comment: infrequently     Social History   Substance and Sexual Activity  Drug Use Yes  . Types: Cocaine, Marijuana, "Crack" cocaine    Social History   Socioeconomic History  . Marital status: Single    Spouse name: Not on file  . Number of children: 4  . Years of education: Not on file  . Highest education level: Not on file  Occupational History  .  Occupation: N/A  Tobacco Use  . Smoking status: Current Every Day Smoker    Years: 30.00    Types: Cigarettes  . Smokeless tobacco: Never Used  Vaping Use  . Vaping Use: Never used  Substance and Sexual Activity  . Alcohol use: Yes    Comment: infrequently  . Drug use: Yes    Types: Cocaine, Marijuana, "Crack" cocaine  . Sexual activity: Not Currently    Partners: Female     Birth control/protection: None  Other Topics Concern  . Not on file  Social History Narrative   ** Merged History Encounter **       Social Determinants of Health   Financial Resource Strain: Not on file  Food Insecurity: Not on file  Transportation Needs: Not on file  Physical Activity: Not on file  Stress: Not on file  Social Connections: Not on file   Additional Social History:    Allergies:   Allergies  Allergen Reactions  . Ibuprofen Anaphylaxis  . Acetaminophen Hives, Nausea And Vomiting and Rash    Pt overdosed on bottle of APAP 05/06/16   . Latex Other (See Comments)    Pt denies allergy to latex 03/02/2021  . Lyrica Cr [Pregabalin Er] Other (See Comments)    Dystonia and dysarthria  . Peanut-Containing Drug Products Other (See Comments)    Pt denies allergy to peanut butter 03/02/2021    Labs:  Results for orders placed or performed during the hospital encounter of 03/02/21 (from the past 48 hour(s))  Comprehensive metabolic panel     Status: Abnormal   Collection Time: 03/02/21  3:30 PM  Result Value Ref Range   Sodium 136 135 - 145 mmol/L   Potassium 3.7 3.5 - 5.1 mmol/L   Chloride 100 98 - 111 mmol/L   CO2 26 22 - 32 mmol/L   Glucose, Bld 81 70 - 99 mg/dL    Comment: Glucose reference range applies only to samples taken after fasting for at least 8 hours.   BUN 14 6 - 20 mg/dL   Creatinine, Ser 4.16 0.61 - 1.24 mg/dL   Calcium 8.8 (L) 8.9 - 10.3 mg/dL   Total Protein 7.3 6.5 - 8.1 g/dL   Albumin 3.6 3.5 - 5.0 g/dL   AST 19 15 - 41 U/L   ALT 11 0 - 44 U/L   Alkaline Phosphatase 78 38 - 126 U/L   Total Bilirubin 1.0 0.3 - 1.2 mg/dL   GFR, Estimated >60 >63 mL/min    Comment: (NOTE) Calculated using the CKD-EPI Creatinine Equation (2021)    Anion gap 10 5 - 15    Comment: Performed at [facility], [address], [place], [name] [address]  CBC with Differential     Status: Abnormal   Collection Time: 03/02/21  3:30 PM  Result  Value Ref Range   WBC 4.5 4.0 - 10.5 K/uL   RBC 4.59 4.22 - 5.81 MIL/uL   Hemoglobin 14.3 13.0 - 17.0 g/dL   HCT 09.3 23.5 - 57.3 %   MCV 95.0 80.0 - 100.0 fL   MCH 31.2 26.0 - 34.0 pg   MCHC 32.8 30.0 - 36.0 g/dL   RDW 22.0 (H) 25.4 - 27.0 %   Platelets 291 150 - 400 K/uL   nRBC 0.0 0.0 - 0.2 %   Neutrophils Relative % 47 %   Neutro Abs 2.1 1.7 - 7.7 K/uL   Lymphocytes Relative 37 %   Lymphs Abs 1.7 0.7 - 4.0  K/uL   Monocytes Relative 14 %   Monocytes Absolute 0.6 0.1 - 1.0 K/uL   Eosinophils Relative 1 %   Eosinophils Absolute 0.0 0.0 - 0.5 K/uL   Basophils Relative 1 %   Basophils Absolute 0.0 0.0 - 0.1 K/uL   Immature Granulocytes 0 %   Abs Immature Granulocytes 0.01 0.00 - 0.07 K/uL    Comment: Performed at [facility], [address] [place], [place] [address]  Brain natriuretic peptide     Status: Abnormal   Collection Time: 03/02/21  3:30 PM  Result Value Ref Range   B Natriuretic Peptide 424.6 (H) 0.0 - 100.0 pg/mL    Comment: Performed at [facility], [address] [place], [place] [address]  Troponin I (High Sensitivity)     Status: Abnormal   Collection Time: 03/02/21  3:30 PM  Result Value Ref Range   Troponin I (High Sensitivity) 36 (H) <18 ng/L    Comment: (NOTE) Elevated high sensitivity troponin I (hsTnI) values and significant  changes across serial measurements may suggest ACS but many other  chronic and acute conditions are known to elevate hsTnI results.  Refer to the "Links" section for chest pain algorithms and additional  guidance. Performed at [facility], [address] [place], [place] [address]   D-dimer, quantitative     Status: Abnormal   Collection Time: 03/02/21  3:30 PM  Result Value Ref Range   D-Dimer, Quant 0.69 (H) 0.00 - 0.50 ug/mL-FEU    Comment: (NOTE) At the manufacturer cut-off value of 0.5 g/mL FEU, this assay has a negative predictive value of 95-100%.This assay  is intended for use in conjunction with a clinical pretest probability (PTP) assessment model to exclude pulmonary embolism (PE) and deep venous thrombosis (DVT) in outpatients suspected of PE or DVT. Results should be correlated with clinical presentation. Performed at [facility], [address] [place], [place] [address]   Troponin I (High Sensitivity)     Status: Abnormal   Collection Time: 03/02/21  4:00 PM  Result Value Ref Range   Troponin I (High Sensitivity) 27 (H) <18 ng/L    Comment: (NOTE) Elevated high sensitivity troponin I (hsTnI) values and significant  changes across serial measurements may suggest ACS but many other  chronic and acute conditions are known to elevate hsTnI results.  Refer to the "Links" section for chest pain algorithms and additional  guidance. Performed at [facility], [address] [place], [place] [address]   Resp Panel by RT-PCR (Flu A&B, Covid) Nasopharyngeal Swab     Status: None   Collection Time: 03/02/21  6:30 PM   Specimen: Nasopharyngeal Swab; Nasopharyngeal(NP) swabs in vial transport medium  Result Value Ref Range   SARS Coronavirus 2 by RT PCR NEGATIVE NEGATIVE    Comment: (NOTE) SARS-CoV-2 target nucleic acids are NOT DETECTED.  The SARS-CoV-2 RNA is generally detectable in upper respiratory specimens during the acute phase of infection. The lowest concentration of SARS-CoV-2 viral copies this assay can detect is 138 copies/mL. A negative result does not preclude SARS-Cov-2 infection and should not be used as the sole basis for treatment or other patient management decisions. A negative result may occur with  improper specimen collection/handling, submission of specimen other than nasopharyngeal swab, presence of viral mutation(s) within the areas targeted by this assay, and inadequate number of viral copies(<138 copies/mL). A negative result must be combined with clinical observations,  patient history, and  epidemiological information. The expected result is Negative.  Fact Sheet for Patients:  BloggerCourse.com  Fact Sheet for Healthcare Providers:  SeriousBroker.it  This test is no t yet approved or cleared by the [place] FDA and  has been authorized for detection and/or diagnosis of SARS-CoV-2 by FDA under an Emergency Use Authorization (EUA). This EUA will remain  in effect (meaning this test can be used) for the duration of the COVID-19 declaration under Section 564(b)(1) of the Act, 21 U.S.C.section 360bbb-3(b)(1), unless the authorization is terminated  or revoked sooner.       Influenza A by PCR NEGATIVE NEGATIVE   Influenza B by PCR NEGATIVE NEGATIVE    Comment: (NOTE) The Xpert Xpress SARS-CoV-2/FLU/RSV plus assay is intended as an aid in the diagnosis of influenza from Nasopharyngeal swab specimens and should not be used as a sole basis for treatment. Nasal washings and aspirates are unacceptable for Xpert Xpress SARS-CoV-2/FLU/RSV testing.  Fact Sheet for Patients: BloggerCourse.com  Fact Sheet for Healthcare Providers: SeriousBroker.it  This test is not yet approved or cleared by the [place] FDA and has been authorized for detection and/or diagnosis of SARS-CoV-2 by FDA under an Emergency Use Authorization (EUA). This EUA will remain in effect (meaning this test can be used) for the duration of the COVID-19 declaration under Section 564(b)(1) of the Act, 21 U.S.C. section 360bbb-3(b)(1), unless the authorization is terminated or revoked.  Performed at [facility], [address] [place], [name] [address]   Ethanol     Status: None   Collection Time: 03/03/21  4:26 AM  Result Value Ref Range   Alcohol, Ethyl (B) <10 <10 mg/dL    Comment: (NOTE) Lowest detectable limit for serum alcohol is 10 mg/dL.  For  medical purposes only. Performed at [facility], [address], [place], [name] [address]   Magnesium     Status: None   Collection Time: 03/03/21  4:26 AM  Result Value Ref Range   Magnesium 1.9 1.7 - 2.4 mg/dL    Comment: Performed at [facility], [address], [place], [name] [address]  Basic metabolic panel     Status: Abnormal   Collection Time: 03/03/21  4:26 AM  Result Value Ref Range   Sodium 140 135 - 145 mmol/L   Potassium 3.8 3.5 - 5.1 mmol/L   Chloride 105 98 - 111 mmol/L   CO2 25 22 - 32 mmol/L   Glucose, Bld 95 70 - 99 mg/dL    Comment: Glucose reference range applies only to samples taken after fasting for at least 8 hours.   BUN 14 6 - 20 mg/dL   Creatinine, Ser 9.14 0.61 - 1.24 mg/dL   Calcium 8.7 (L) 8.9 - 10.3 mg/dL   GFR, Estimated >78 >29 mL/min    Comment: (NOTE) Calculated using the CKD-EPI Creatinine Equation (2021)    Anion gap 10 5 - 15    Comment: Performed at [facility], [address] [place], [name] [address]  CBC     Status: Abnormal   Collection Time: 03/03/21  4:26 AM  Result Value Ref Range   WBC 3.3 (L) 4.0 - 10.5 K/uL   RBC 3.83 (L) 4.22 - 5.81 MIL/uL   Hemoglobin 12.2 (L) 13.0 - 17.0 g/dL   HCT 08.6 (L) 57.8 - 46.9 %   MCV 96.1 80.0 - 100.0 fL   MCH 31.9 26.0 - 34.0 pg   MCHC 33.2 30.0 - 36.0  g/dL   RDW 87.8 (H) 67.6 - 72.0 %   Platelets 263 150 - 400 K/uL   nRBC 0.0 0.0 - 0.2 %    Comment: Performed at [facility], [address], [place], [name] [address]  Procalcitonin - Baseline     Status: None   Collection Time: 03/03/21  5:22 AM  Result Value Ref Range   Procalcitonin <0.10 ng/mL    Comment:        Interpretation: PCT (Procalcitonin) <= 0.5 ng/mL: Systemic infection (sepsis) is not likely. Local bacterial infection is possible. (NOTE)       Sepsis PCT Algorithm           Lower Respiratory Tract                                       Infection PCT Algorithm    ----------------------------     ----------------------------         PCT < 0.25 ng/mL                PCT < 0.10 ng/mL          Strongly encourage             Strongly discourage   discontinuation of antibiotics    initiation of antibiotics    ----------------------------     -----------------------------       PCT 0.25 - 0.50 ng/mL            PCT 0.10 - 0.25 ng/mL               OR       >80% decrease in PCT            Discourage initiation of                                            antibiotics      Encourage discontinuation           of antibiotics    ----------------------------     -----------------------------         PCT >= 0.50 ng/mL              PCT 0.26 - 0.50 ng/mL               AND        <80% decrease in PCT             Encourage initiation of                                             antibiotics       Encourage continuation           of antibiotics    ----------------------------     -----------------------------        PCT >= 0.50 ng/mL                  PCT > 0.50 ng/mL               AND         increase in PCT  Strongly encourage                                      initiation of antibiotics    Strongly encourage escalation           of antibiotics                                     -----------------------------                                           PCT <= 0.25 ng/mL                                                 OR                                        > 80% decrease in PCT                                      Discontinue / Do not initiate                                             antibiotics  Performed at [facility], [address], [place], [name] [address]     Current Facility-Administered Medications  Medication Dose Route Frequency Provider Last Rate Last Admin  . albuterol (VENTOLIN HFA) 108 (90 Base) MCG/ACT inhaler 1-2 puff  1-2 puff Inhalation Q4H PRN Darreld Mclean R, MD       . aspirin chewable tablet 81 mg  81 mg Oral Daily Charlsie Quest, MD   81 mg at 03/03/21 0933  . atorvastatin (LIPITOR) tablet 40 mg  40 mg Oral Daily Charlsie Quest, MD   40 mg at 03/03/21 0933  . carvedilol (COREG) tablet 3.125 mg  3.125 mg Oral BID WC Darreld Mclean R, MD   3.125 mg at 03/03/21 0934  . divalproex (DEPAKOTE ER) 24 hr tablet 500 mg  500 mg Oral BID Darreld Mclean R, MD   500 mg at 03/03/21 0933  . doxycycline (VIBRA-TABS) tablet 100 mg  100 mg Oral Q12H Darreld Mclean R, MD   100 mg at 03/03/21 0933  . enoxaparin (LOVENOX) injection 40 mg  40 mg Subcutaneous Q24H Darreld Mclean R, MD   40 mg at 03/02/21 2217  . furosemide (LASIX) injection 40 mg  40 mg Intravenous BID Charlsie Quest, MD   40 mg at 03/03/21 0934  . gabapentin (NEURONTIN) capsule 600 mg  600 mg Oral TID Charlsie Quest, MD   600 mg at 03/03/21 0933  . guaiFENesin (MUCINEX) 12 hr tablet 600 mg  600 mg Oral BID Darreld Mclean R, MD   600 mg at 03/03/21 0933  . hydrALAZINE (APRESOLINE) tablet 25  mg  25 mg Oral Q8H Gonfa, Taye T, MD      . ipratropium-albuterol (DUONEB) 0.5-2.5 (3) MG/3ML nebulizer solution 3 mL  3 mL Nebulization Q6H PRN Charlsie Quest, MD   3 mL at 03/03/21 0917  . ipratropium-albuterol (DUONEB) 0.5-2.5 (3) MG/3ML nebulizer solution 3 mL  3 mL Nebulization Q6H Gonfa, Taye T, MD      . isosorbide dinitrate (ISORDIL) tablet 5 mg  5 mg Oral TID Almon Hercules, MD      . Melene Muller ON 03/04/2021] isosorbide mononitrate (IMDUR) 24 hr tablet 15 mg  15 mg Oral Daily Gonfa, Taye T, MD      . levETIRAcetam (KEPPRA) tablet 500 mg  500 mg Oral BID Darreld Mclean R, MD   500 mg at 03/03/21 0933  . magnesium oxide (MAG-OX) tablet 400 mg  400 mg Oral BID Darreld Mclean R, MD   400 mg at 03/02/21 2217  . methocarbamol (ROBAXIN) tablet 750 mg  750 mg Oral Q8H PRN Gonfa, Taye T, MD      . nicotine (NICODERM CQ - dosed in mg/24 hours) patch 21 mg  21 mg Transdermal Daily Patel, Vishal R, MD      . potassium chloride  (KLOR-CON) packet 20 mEq  20 mEq Oral Daily Charlsie Quest, MD   20 mEq at 03/02/21 2228  . sacubitril-valsartan (ENTRESTO) 24-26 mg per tablet  1 tablet Oral BID Charlsie Quest, MD   1 tablet at 03/03/21 0932  . spironolactone (ALDACTONE) tablet 12.5 mg  12.5 mg Oral Daily Darreld Mclean R, MD   12.5 mg at 03/03/21 0932  . traZODone (DESYREL) tablet 50 mg  50 mg Oral QHS Almon Hercules, MD        Musculoskeletal: Strength & Muscle Tone: not tested Gait & Station: not tested-patient refused Patient leans: N/A    Psychiatric Specialty Exam:  Presentation  General Appearance: Disheveled  Eye Contact:Minimal  Speech:Garbled; Pressured  Speech Volume:Increased  Handedness:Right   Mood and Affect  Mood:Irritable; Angry  Affect:Labile   Thought Process  Thought Processes:Coherent  Descriptions of Associations:Intact  Orientation:Full (Time, Place and Person)  Thought Content:Logical  History of Schizophrenia/Schizoaffective disorder:No data recorded Duration of Psychotic Symptoms:No data recorded Hallucinations:Hallucinations: None  Ideas of Reference:None  Suicidal Thoughts:Suicidal Thoughts: No  Homicidal Thoughts:Homicidal Thoughts: No   Sensorium  Memory:Immediate Fair  Judgment:Poor  Insight:Shallow   Executive Functions  Concentration:Poor  Attention Span:Poor  Recall:Fair  Fund of Knowledge:Fair  Language:Fair   Psychomotor Activity  Psychomotor Activity:Psychomotor Activity: Increased   Assets  Assets:Desire for Improvement   Sleep  Sleep:Sleep: Fair   Physical Exam: Physical Exam Psychiatric:        Attention and Perception: He is inattentive.        Mood and Affect: Affect is labile and angry.        Speech: Speech is rapid and pressured.        Behavior: Behavior is agitated.        Thought Content: Thought content normal.        Cognition and Memory: Cognition normal.        Judgment: Judgment is impulsive.     Review of Systems  Psychiatric/Behavioral: Positive for substance abuse.   Blood pressure 131/86, pulse 89, temperature 99.4 F (37.4 C), temperature source Oral, resp. rate 20, SpO2 100 %. There is no height or weight on file to calculate BMI.  Treatment Plan Summary: 55 year old male with history of Schizoaffective-Bipolar  type, Cocaine use disorder, Multiple medical problems who was admitted for shortness of breath, orthopnea, leg swelling and pain. Patient reports being irritable, aggressive, agitated because of sever leg pain. He denies depression, psychosis, delusions and self harming thoughts. Chart and medication review revealed that patient is currently on adequate amount of mood stabilizer to address aggression.  Recommendations: -Consider Optimizing leg pain treatment. -Continue Gabapentin 600 mg tid, Depakote ER 500 mg twice daily for aggression/mood swings/agitation. -Continue Trazodone 50 mg, 1 tablet at bedtime as needed for sleep. -Consider Saphris 5 mg S/L twice daily as needed for aggression/agitation   Disposition: Patient does not meet criteria for psychiatric inpatient admission. Supportive therapy provided about ongoing stressors. Psychiatric service signing out. Re-consult as needed  Thedore Mins, MD 03/03/2021 12:52 PM

## 2021-03-03 NOTE — Evaluation (Signed)
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   Clinical Impression   Patient is homeless, reports using a wheelchair at baseline. Is very discouraged that had to use bed pan for toileting "someone else is wiping my butt" with patient expressing as soon as therapy walks in the door "I want to go to rehab." Currently patient is limited by significant R LE pain limiting his overall activity tolerance, balance and safety. Patient needed significantly increased time and mod A x2 for safety to perform bed mobility. Patient appears to become more anxious and restless with bed mobility at one point starts breathing heavily "get the nurse I can't breathe!" Nursing staff arrive with patient saturating at 99%, cue patient in deep breathing and provide encouragement to calm patient down. Once positioned at edge of bed patient reports too much pain in dependent position and needed increased time and mod x2 to return to semi-supine. Recommend continued acute OT services to maximize overall endurance, balance, safety awareness in order to facilitate D/C to venue listed below.    Follow Up Recommendations  CIR    Equipment Recommendations  None recommended by OT    Recommendations for Other Services Rehab consult     Precautions / Restrictions Precautions Precautions: Fall Precaution Comments: can become agitated, likes step by step instruction at his own pace      Mobility Bed Mobility Overal bed mobility: Needs Assistance Bed  Mobility: Supine to Sit;Sit to Supine     Supine to sit: Mod assist;+2 for safety/equipment Sit to supine: Mod assist;+2 for safety/equipment;+2 for physical assistance;HOB elevated   General bed mobility comments: patient needing significantly increased time to perform all bed mobility due to R LE pain. Becomes increasingly anxious/restless with mobility especially if not at his pace. Pt needing assist to manage LEs to EOB as well as min A at trunk level. Once positioned EOB reports too much pain in R LE in dependent position, needing increased time and assist to bring LEs back into bed    Transfers                 General transfer comment: unable 2* pain    Balance Overall balance assessment: Needs assistance Sitting-balance support: Bilateral upper extremity supported Sitting balance-Leahy Scale: Poor                                     ADL either performed or assessed with clinical judgement   ADL Overall ADL's : Needs assistance/impaired     Grooming: Set up;Bed level   Upper Body Bathing: Minimal assistance;Bed level   Lower Body Bathing: Moderate assistance;Bed level   Upper Body Dressing : Minimal assistance;Bed level Upper Body Dressing Details (indicate cue type and reason): assist to guide R UE into gown Lower Body Dressing: Total assistance;Bed level Lower Body Dressing Details (indicate cue type and reason): to don socks   Toilet Transfer Details (indicate cue type and reason): unable, took extended time to get patient to edge of bed with  x1 episode of patient calling out "I can't breathe!!" despite O2 reading 99% with RN present. Once positioned to EOB patient reporting too much pain in R LE therefore positioned back to bed Toileting- Clothing Manipulation and Hygiene: Total assistance;Bed level       Functional mobility during ADLs: +2 for physical assistance;+2 for safety/equipment;Moderate assistance (bed level) General ADL Comments:  patient requiring increased assistance for self care due to significant R LE pain limiting his activity tolerance, balance. Patient stating he wants to go to rehab because he cannot take care of himself                  Pertinent Vitals/Pain Pain Assessment: Faces Faces Pain Scale: Hurts whole lot Pain Location: R LE, describes knee to foot however also grabbing his R hip/thigh Pain Descriptors / Indicators: Grimacing;Guarding Pain Intervention(s): Monitored during session     Hand Dominance Right   Extremity/Trunk Assessment Upper Extremity Assessment Upper Extremity Assessment: RUE deficits/detail RUE Deficits / Details: R UE ataxia RUE Coordination: decreased fine motor;decreased gross motor   Lower Extremity Assessment Lower Extremity Assessment: Defer to PT evaluation       Communication Communication Communication: Expressive difficulties   Cognition Arousal/Alertness: Awake/alert Behavior During Therapy: Restless;Anxious Overall Cognitive Status: No family/caregiver present to determine baseline cognitive functioning                                 General Comments: difficult to understand speech at times due to expressive difficulties hx CVA, perseverating on his pain in R leg repeatedly telling therapists. if start to go too fast for him becomes upset              Home Living Family/patient expects to be discharged to:: Shelter/Homeless                                        Prior Functioning/Environment Level of Independence: Independent with assistive device(s)        Comments: patient reports using a wheelchair        OT Problem List: Decreased activity tolerance;Decreased safety awareness;Pain;Decreased coordination      OT Treatment/Interventions: Self-care/ADL training;Therapeutic activities;Patient/family education;Balance training;Therapeutic exercise;DME and/or AE instruction    OT Goals(Current goals can  be found in the care plan section) Acute Rehab OT Goals Patient Stated Goal: patient wanting to go to rehab OT Goal Formulation: With patient Time For Goal Achievement: 03/17/21 Potential to Achieve Goals: Good  OT Frequency: Min 2X/week   Barriers to D/C: Inaccessible home environment  patient currently homeless       Co-evaluation PT/OT/SLP Co-Evaluation/Treatment: Yes Reason for Co-Treatment: For patient/therapist safety;To address functional/ADL transfers;Necessary to address cognition/behavior during functional activity;Complexity of the patient's impairments (multi-system involvement)   OT goals addressed during session: ADL's and self-care      AM-PAC OT "6 Clicks" Daily Activity     Outcome Measure Help from another person eating meals?: None Help from another person taking care of personal grooming?: A Little Help from another person toileting, which includes using toliet, bedpan, or urinal?: Total Help from another person bathing (including washing, rinsing, drying)?: A Lot Help from another person to put on and taking off regular upper body clothing?: A Little Help from another person to put on and taking off regular lower body clothing?: Total 6 Click Score:  14   End of Session Nurse Communication: Mobility status  Activity Tolerance: Patient limited by pain Patient left: in bed;with call bell/phone within reach;with bed alarm set;with nursing/sitter in room  OT Visit Diagnosis: Other abnormalities of gait and mobility (R26.89);Ataxia, unspecified (R27.0);Other symptoms and signs involving cognitive function;Cognitive communication deficit (R41.841);Pain Pain - Right/Left: Right Pain - part of body: Leg                Time: 2355-7322 OT Time Calculation (min): 27 min Charges:  OT General Charges $OT Visit: 1 Visit OT Evaluation $OT Eval Moderate Complexity: 1 Mod  Marlyce Huge OT OT pager: [phone]  Carmelia Roller 03/03/2021, 11:01 AM

## 2021-03-03 NOTE — Progress Notes (Signed)
Physical therapy in the room trying to get patient up, patient became SOB, and C/O bil leg pain, and involuntary arm movement, respitory therapist at the bedside for breathing treatment. Dr. Alanda Slim notified, on the way to see patient. Will continue to assess patient.

## 2021-03-04 ENCOUNTER — Encounter (HOSPITAL_COMMUNITY): Payer: Self-pay | Admitting: Internal Medicine

## 2021-03-04 MED ORDER — IPRATROPIUM-ALBUTEROL 0.5-2.5 (3) MG/3ML IN SOLN
3.0000 mL | Freq: Three times a day (TID) | RESPIRATORY_TRACT | Status: DC
  Administered 2021-03-05 (×3): 3 mL via RESPIRATORY_TRACT
  Filled 2021-03-04 (×3): qty 3

## 2021-03-04 MED ORDER — FUROSEMIDE 40 MG PO TABS
40.0000 mg | ORAL_TABLET | Freq: Every day | ORAL | Status: DC
  Administered 2021-03-05 – 2021-03-12 (×7): 40 mg via ORAL
  Filled 2021-03-04 (×8): qty 1

## 2021-03-04 NOTE — Progress Notes (Signed)
PROGRESS NOTE  [name] QHU:[id number] DOB: July 04, 1966   PCP: [name]  Patient is from: [place], [facility].  Reported using wheelchair at baseline.  DOA: 03/02/2021 LOS: 1  Chief complaints: Shortness of breath, orthopnea and leg swelling and pain  Brief Narrative / Interim history: 55 year old M with PMH of combined CHF, cerebellar CVA with dysarthria, CAD, COPD, HTN, HLD, bipolar disorder, aggressive behavior, neuropathy, cocaine use, tobacco use, recent hospitalization from 1/17-3/6 for cerebellar CVA, CHF exacerbation and bilateral great toe osteomyelitis presenting with shortness of breath, orthopnea, edema and lower extremity pain, and admitted for acute on chronic combined CHF and possible RML pneumonia.  Patient has had 5 ED visits including this since his discharged on 3/6.  In ED, slightly tachycardic and hypertensive.  Normal saturation.  BNP, troponin and D-dimer slightly elevated. CTA and LE [place] negative for VTE.  Started on IV Lasix and antibiotics.  Pro-Cal negative.  Cefdinir discontinued.    Subjective: Seen and examined earlier this morning.  Patient was aggressive and not cooperative with care last night and this morning.Marland Kitchen  He was also dismissing staff.    He is not clear with what he wants other than saying "I want them to leave me alone". He is swinging his arm violently and screaming as he talks.  Objective: Vitals:   03/03/21 1600 03/03/21 2048 03/04/21 1000 03/04/21 1045  BP: 121/64  (!) 154/99 124/63  Pulse: 95  97 94  Resp:    18  Temp:    98.3 F (36.8 C)  TempSrc:    Oral  SpO2:  97% 98% 97%    Intake/Output Summary (Last 24 hours) at 03/04/2021 1153 Last data filed at 03/04/2021 1139 Gross per 24 hour  Intake 1250 ml  Output 2275 ml  Net -1025 ml   There were no vitals filed for this visit.  Examination:  GENERAL: Appears angry.  Swinging his arms violently. HEENT: MMM.  Vision and hearing grossly intact.  NECK: Supple.  No  apparent JVD.  RESP: On RA.  No IWOB.  Fair aeration bilaterally. CVS:  RRR. Heart sounds normal.  ABD/GI/GU: BS+. Abd soft, NTND.  MSK/EXT:  Moves extremities. Bilateral great toe amputations.  1+ edema. SKIN: Dry scabs over bilateral foot from recent bilateral great amputation NEURO: Awake and alert.  Dysarthria.  No apparent focal neuro deficit. PSYCH: Appears angry and aggressive.  Swinging his arms violently.  Procedures:  None  Microbiology summarized: COVID-19 and influenza PCR nonreactive.  Assessment & Plan: Acute on chronic combined systolic and diastolic CHF: TTE on 1/18 with LVEF of 20%, GH, G2-DD, moderately reduced RVSF, moderate LAE and RAE.  Presents with SOB, orthopnea, DOE and edema.  BNP elevated but less than prior values. CTA and LE [place] negative for DVT.  Still with ongoing cocaine and tobacco use.  Had about 1.6 L UOP/24 hours.  Refused labs this morning. -Continue IV Lasix 40 mg twice daily -Closely monitor fluid status, renal functions and electrolytes -Continue Coreg, Entresto, Aldactone and hydralazine.  Changed Isordil to Imdur.  -Encouraged to quit using cocaine and smoking cigarettes.  RML pneumonia: Unlikely.  Procalcitonin negative.  -Discontinued cefdinir -Continue doxycycline given some degree of COPD exacerbation -Continue breathing treatments  COPD with mild exacerbation: -Doxycycline as above -Scheduled DuoNeb with as needed  History of right and left cerebellar ischemic stroke with residual dysarthria: -Continue aspirin and atorvastatin.  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  No evidence of infection apart from poor hygiene.  Elevated troponin/CAD: Elevated troponin likely demand ischemia.  No chest pain. -Continue cardiac meds, aspirin and a statin as above  Elevated D-dimer: CTA and LE [place] negative for VTE.  Hypertension: SBP slightly elevated. -Cardiac  meds and diuretics as above  Bipolar disorder/aggressive behavior/restlessness: aggressive and impulsive at times.  Also refusing care including vital signs and medications at times. including vital signs and medications at times.  Rer recent discharge summary, he threatened staffs felt to be unsafe for others and discharged from the hospital after he was medically cleared.   -Psychiatry recommended continued home Depakote and adding Saphris as needed-patient has been refusing Depakote. -Reconsulted psychiatry.  History of suspected seizure activity: -Continue Keppra.  Hyperlipidemia: -Continue atorvastatin. -Check CK  Substance use disorder/tobacco use disorder/cocaine use-admitted use of cocaine and tobacco -Check UDS  -Encouraged to stop using cocaine and smoking cigarettes -Continue nicotine patch  Bilateral lower extremity pain/neuropathy -Continue home gabapentin -Added Robaxin as needed for possible muscle spasm  Generalized weakness/debility-reportedly uses wheelchair.  Per discharge summary on 3/6, patient was able to ambulate from his room to nursing station prior to discharge. -Therapy recommending CIR but not sure if he is a candidate without reliable disposition after CIR -Consult CIR for evaluation  Leukopenia: New.  Unclear etiology. -Recheck in the morning  Homeless: -TOC consulted    There is no height or weight on file to calculate BMI.         DVT prophylaxis:  enoxaparin (LOVENOX) injection 40 mg Start: 03/02/21 2200  Code Status: Full code Family Communication: patient has no close family/acquaintances  Level of care: Telemetry Status is: Inpatient  Remains inpatient appropriate because:Altered mental status, Unsafe d/c plan, IV treatments appropriate due to intensity of illness or inability to take PO and Inpatient level of care appropriate due to severity of illness   Dispo: The patient is from: Homeless              Anticipated d/c is  to: SNF              Patient currently is not medically stable to d/c.   Difficult to place patient No           Consultants:  Psychiatry   Sch Meds:  Scheduled Meds: . aspirin  81 mg Oral Daily  . atorvastatin  40 mg Oral Daily  . carvedilol  3.125 mg Oral BID WC  . divalproex  500 mg Oral BID  . doxycycline  100 mg Oral Q12H  . enoxaparin (LOVENOX) injection  40 mg Subcutaneous Q24H  . furosemide  40 mg Intravenous BID  . gabapentin  600 mg Oral TID  . guaiFENesin  600 mg Oral BID  . hydrALAZINE  25 mg Oral Q8H  . ipratropium-albuterol  3 mL Nebulization TID  . isosorbide mononitrate  15 mg Oral Daily  . levETIRAcetam  500 mg Oral BID  . nicotine  21 mg Transdermal Daily  . potassium chloride  20 mEq Oral Daily  . sacubitril-valsartan  1 tablet Oral BID  . spironolactone  12.5 mg Oral Daily  . traZODone  50 mg Oral QHS   Continuous Infusions: PRN Meds:.albuterol, asenapine, ipratropium-albuterol, methocarbamol  Antimicrobials: Anti-infectives (From admission, onward)   Start     Dose/Rate Route Frequency Ordered Stop   03/02/21 2200  doxycycline (VIBRA-TABS) tablet 100 mg        100 mg Oral Every 12 hours 03/02/21 1954 03/07/21 2159  03/02/21 2200  cefdinir (OMNICEF) capsule 300 mg  Status:  Discontinued        300 mg Oral Every 12 hours 03/02/21 1954 03/03/21 1014       I have personally reviewed the following labs and images: CBC: Recent Labs  Lab 03/02/21 1530 03/03/21 0426  WBC 4.5 3.3*  NEUTROABS 2.1  --   HGB 14.3 12.2*  HCT 43.6 36.8*  MCV 95.0 96.1  PLT 291 263   BMP &GFR Recent Labs  Lab 03/02/21 1530 03/03/21 0426  NA 136 140  K 3.7 3.8  CL 100 105  CO2 26 25  GLUCOSE 81 95  BUN 14 14  CREATININE 1.11 1.07  CALCIUM 8.8* 8.7*  MG  --  1.9   Estimated Creatinine Clearance: 87.8 mL/min (by C-G formula based on SCr of 1.07 mg/dL). Liver & Pancreas: Recent Labs  Lab 03/02/21 1530  AST 19  ALT 11  ALKPHOS 78  BILITOT 1.0   PROT 7.3  ALBUMIN 3.6   No results for input(s): LIPASE, AMYLASE in the last 168 hours. No results for input(s): AMMONIA in the last 168 hours. Diabetic: No results for input(s): HGBA1C in the last 72 hours. No results for input(s): GLUCAP in the last 168 hours. Cardiac Enzymes: No results for input(s): CKTOTAL, CKMB, CKMBINDEX, TROPONINI in the last 168 hours. No results for input(s): PROBNP in the last 8760 hours. Coagulation Profile: No results for input(s): INR, PROTIME in the last 168 hours. Thyroid Function Tests: No results for input(s): TSH, T4TOTAL, FREET4, T3FREE, THYROIDAB in the last 72 hours. Lipid Profile: No results for input(s): CHOL, HDL, LDLCALC, TRIG, CHOLHDL, LDLDIRECT in the last 72 hours. Anemia Panel: No results for input(s): VITAMINB12, FOLATE, FERRITIN, TIBC, IRON, RETICCTPCT in the last 72 hours. Urine analysis:    Component Value Date/Time   COLORURINE AMBER (A) 02/09/2021 1455   APPEARANCEUR CLEAR 02/09/2021 1455   LABSPEC 1.030 02/09/2021 1455   PHURINE 5.0 02/09/2021 1455   GLUCOSEU NEGATIVE 02/09/2021 1455   HGBUR NEGATIVE 02/09/2021 1455   BILIRUBINUR NEGATIVE 02/09/2021 1455   KETONESUR 5 (A) 02/09/2021 1455   PROTEINUR 30 (A) 02/09/2021 1455   NITRITE NEGATIVE 02/09/2021 1455   LEUKOCYTESUR NEGATIVE 02/09/2021 1455   Sepsis Labs: Invalid input(s): PROCALCITONIN, LACTICIDVEN  Microbiology: Recent Results (from the past 240 hour(s))  Resp Panel by RT-PCR (Flu A&B, Covid) Nasopharyngeal Swab     Status: None   Collection Time: 03/02/21  6:30 PM   Specimen: Nasopharyngeal Swab; Nasopharyngeal(NP) swabs in vial transport medium  Result Value Ref Range Status   SARS Coronavirus 2 by RT PCR NEGATIVE NEGATIVE Final    Comment: (NOTE) SARS-CoV-2 target nucleic acids are NOT DETECTED.  The SARS-CoV-2 RNA is generally detectable in upper respiratory specimens during the acute phase of infection. The lowest concentration of SARS-CoV-2 viral  copies this assay can detect is 138 copies/mL. A negative result does not preclude SARS-Cov-2 infection and should not be used as the sole basis for treatment or other patient management decisions. A negative result may occur with  improper specimen collection/handling, submission of specimen other than nasopharyngeal swab, presence of viral mutation(s) within the areas targeted by this assay, and inadequate number of viral copies(<138 copies/mL). A negative result must be combined with clinical observations, patient history, and epidemiological information. The expected result is Negative.  Fact Sheet for Patients:  BloggerCourse.com  Fact Sheet for Healthcare Providers:  SeriousBroker.it  This test is no t yet approved or cleared by  the [employer] and  has been authorized for detection and/or diagnosis of SARS-CoV-2 by FDA under an Emergency Use Authorization (EUA). This EUA will remain  in effect (meaning this test can be used) for the duration of the COVID-19 declaration under Section 564(b)(1) of the Act, 21 U.S.C.section 360bbb-3(b)(1), unless the authorization is terminated  or revoked sooner.       Influenza A by PCR NEGATIVE NEGATIVE Final   Influenza B by PCR NEGATIVE NEGATIVE Final    Comment: (NOTE) The Xpert Xpress SARS-CoV-2/FLU/RSV plus assay is intended as an aid in the diagnosis of influenza from Nasopharyngeal swab specimens and should not be used as a sole basis for treatment. Nasal washings and aspirates are unacceptable for Xpert Xpress SARS-CoV-2/FLU/RSV testing.  Fact Sheet for Patients: BloggerCourse.com  Fact Sheet for Healthcare Providers: SeriousBroker.it  This test is not yet approved or cleared by the [place] FDA and has been authorized for detection and/or diagnosis of SARS-CoV-2 by FDA under an Emergency Use Authorization (EUA). This  EUA will remain in effect (meaning this test can be used) for the duration of the COVID-19 declaration under Section 564(b)(1) of the Act, 21 U.S.C. section 360bbb-3(b)(1), unless the authorization is terminated or revoked.  Performed at [facility], [address] [place], [name] [address]     Radiology Studies: No results found.     Jode Lippe T. Fardowsa Authier [facility]  If 7PM-7AM, please contact night-coverage [email or web address] 03/04/2021, 11:53 AM

## 2021-03-04 NOTE — Plan of Care (Signed)
  Problem: Pain Managment: Goal: General experience of comfort will improve Outcome: Progressing   Problem: Clinical Measurements: Goal: Ability to maintain clinical measurements within normal limits will improve Outcome: Progressing Goal: Will remain free from infection Outcome: Progressing Goal: Diagnostic test results will improve Outcome: Progressing Goal: Respiratory complications will improve Outcome: Progressing Goal: Cardiovascular complication will be avoided Outcome: Progressing   Problem: Education: Goal: Knowledge of General Education information will improve Description: Including pain rating scale, medication(s)/side effects and non-pharmacologic comfort measures Outcome: Progressing

## 2021-03-04 NOTE — Progress Notes (Signed)
Inpatient Rehab Admissions Coordinator Note:   Per PT/OT recommendations, pt was screened for CIR candidacy by Wolfgang Phoenix, MS, CCC-SLp.  At this time we are not recommending an inpatient rehab consult. Noted pt homeless and had a recent prolonged hospital stay after which he discharged to a potential unsafe area.  Pt is nor an appropriate candidate for CIR.  Please contact me with questions.    Wolfgang Phoenix, MS, CCC-SLP Admissions Coordinator [phone] 03/04/21 12:25 PM

## 2021-03-05 LAB — CBC WITH DIFFERENTIAL/PLATELET
Abs Immature Granulocytes: 0.01 10*3/uL (ref 0.00–0.07)
Basophils Absolute: 0 10*3/uL (ref 0.0–0.1)
Basophils Relative: 1 %
Eosinophils Absolute: 0.1 10*3/uL (ref 0.0–0.5)
Eosinophils Relative: 2 %
HCT: 40.8 % (ref 39.0–52.0)
Hemoglobin: 13.4 g/dL (ref 13.0–17.0)
Immature Granulocytes: 0 %
Lymphocytes Relative: 58 %
Lymphs Abs: 2.4 10*3/uL (ref 0.7–4.0)
MCH: 31.3 pg (ref 26.0–34.0)
MCHC: 32.8 g/dL (ref 30.0–36.0)
MCV: 95.3 fL (ref 80.0–100.0)
Monocytes Absolute: 0.8 10*3/uL (ref 0.1–1.0)
Monocytes Relative: 19 %
Neutro Abs: 0.8 10*3/uL — ABNORMAL LOW (ref 1.7–7.7)
Neutrophils Relative %: 20 %
Platelets: 261 10*3/uL (ref 150–400)
RBC: 4.28 MIL/uL (ref 4.22–5.81)
RDW: 17.2 % — ABNORMAL HIGH (ref 11.5–15.5)
WBC: 4 10*3/uL (ref 4.0–10.5)
nRBC: 0 % (ref 0.0–0.2)

## 2021-03-05 LAB — PROCALCITONIN: Procalcitonin: <0.1 ng/mL

## 2021-03-05 LAB — RENAL FUNCTION PANEL
Albumin: 3.3 g/dL — ABNORMAL LOW (ref 3.5–5.0)
Anion gap: 9 (ref 5–15)
BUN: 12 mg/dL (ref 6–20)
CO2: 23 mmol/L (ref 22–32)
Calcium: 8.6 mg/dL — ABNORMAL LOW (ref 8.9–10.3)
Chloride: 104 mmol/L (ref 98–111)
Creatinine, Ser: 1.06 mg/dL (ref 0.61–1.24)
GFR, Estimated: >60 mL/min (ref >60)
Glucose, Bld: 84 mg/dL (ref 70–99)
Phosphorus: 4.1 mg/dL (ref 2.5–4.6)
Potassium: 3.6 mmol/L (ref 3.5–5.1)
Sodium: 136 mmol/L (ref 135–145)

## 2021-03-05 LAB — MAGNESIUM: Magnesium: 1.8 mg/dL (ref 1.7–2.4)

## 2021-03-05 MED ORDER — BUDESONIDE 0.5 MG/2ML IN SUSP: 0.5000 mg | Freq: Two times a day (BID) | RESPIRATORY_TRACT | Status: DC

## 2021-03-05 MED ORDER — BUDESONIDE 0.5 MG/2ML IN SUSP
0.5000 mg | Freq: Two times a day (BID) | RESPIRATORY_TRACT | Status: DC
  Administered 2021-03-05 – 2021-03-12 (×12): 0.5 mg via RESPIRATORY_TRACT
  Filled 2021-03-05 (×13): qty 2

## 2021-03-05 NOTE — [place] FL2 (Signed)
[place] MEDICAID FL2 LEVEL OF CARE SCREENING TOOL     IDENTIFICATION  Patient Name: [name] Birthdate: July 28, 1966 Sex: male Admission Date (Current Location): 03/02/2021  [place] and [place] Number:  Haynes Bast [id number] S Facility and Address:  [facility],  [name] [place] [address]      Provider Number: [id number]  Attending Physician Name and Address:  Almon Hercules, MD  Relative Name and Phone Number:       Current Level of Care: Hospital Recommended Level of Care: Skilled Nursing Facility Prior Approval Number:    Date Approved/Denied:   PASRR Number: pending  Discharge Plan: SNF    Current Diagnoses: Patient Active Problem List   Diagnosis Date Noted  . Acute on chronic combined systolic (congestive) and diastolic (congestive) heart failure (HCC) 03/02/2021  . History of stroke 03/02/2021  . Substance use disorder 03/02/2021  . Skin ulcer of right great toe with necrosis of bone (HCC)   . Encounter for assessment of healthcare decision-making capacity   . Pain, dental   . Dystonia   . Dysarthria   . Shock liver 01/04/2021  . Acute metabolic encephalopathy 01/04/2021  . CAD (coronary artery disease) 01/04/2021  . HTN (hypertension) 01/04/2021  . Bipolar I disorder (HCC) 01/04/2021  . Cocaine abuse (HCC) 01/04/2021  . Homelessness 01/04/2021  . HLD (hyperlipidemia) 01/04/2021  . Osteomyelitis of great toe (HCC)   . Ulcer of great toe, left, with necrosis of bone (HCC)   . Ulcer of great toe, right, with necrosis of muscle (HCC)   . Acute osteomyelitis of toe, left (HCC)   . Acute on chronic combined systolic and diastolic CHF (congestive heart failure) (HCC) 12/20/2020  . Respiratory failure (HCC)   . Ischemic stroke (HCC) 12/17/2020  . 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 Diabetic foot ulcer (HCC) 12/08/2020  . CHF (congestive heart failure) (HCC) 11/14/2020  . CHF exacerbation (HCC) 11/13/2020  . Hypokalemia   .  Hyperlipidemia   . Acute respiratory failure with hypoxia (HCC) 10/26/2020  . COPD with acute exacerbation (HCC) 10/14/2020  . Acute on chronic combined systolic and diastolic CHF (congestive heart failure) (HCC) 10/02/2020  . Near syncope 07/21/2020  . Schizoaffective disorder (HCC) 06/24/2019  . SOB (shortness of breath)   . QT prolongation 05/25/2019  . Cocaine abuse (HCC)   . Elevated troponin   . Homeless   . Suicidal ideation   . Bilateral foot pain 04/05/2019  . Housing situation unstable 03/28/2019  . Bilateral lower extremity edema   . Asthma 12/26/2016  . Tobacco use disorder 12/26/2016  . Schizophrenia (HCC) 12/26/2016  . Cocaine abuse with cocaine-induced mood disorder (HCC) 09/06/2016  . Alcohol use disorder, mild, abuse 07/31/2016  . Substance induced mood disorder (HCC) 07/30/2016  . Schizoaffective disorder, bipolar type (HCC) 05/31/2016  . Cocaine use disorder, severe, dependence (HCC) 05/31/2016  . Hypertension 05/31/2016    Orientation RESPIRATION BLADDER Height & Weight     Self,Time,Situation,Place  Normal Continent Weight: 189 lb 13.1 oz (86.1 kg) Height:  5\' 9"  (175.3 cm)  BEHAVIORAL SYMPTOMS/MOOD NEUROLOGICAL BOWEL NUTRITION STATUS      Continent    AMBULATORY STATUS COMMUNICATION OF NEEDS Skin   Limited Assist Verbally Normal                       Personal Care Assistance Level of Assistance  Bathing,Dressing Bathing Assistance: Limited assistance   Dressing Assistance: Limited assistance     Functional Limitations Info  SPECIAL CARE FACTORS FREQUENCY  OT (By licensed OT),PT (By licensed PT)     PT Frequency: 5x/wk OT Frequency: 5x/wk            Contractures Contractures Info: Not present    Additional Factors Info  Code Status,Allergies,Isolation Precautions Code Status Info: full Allergies Info: see MAR     Isolation Precautions Info: contact     Current Medications (03/05/2021):  This is the current  hospital active medication list Current Facility-Administered Medications  Medication Dose Route Frequency Provider Last Rate Last Admin  . albuterol (VENTOLIN HFA) 108 (90 Base) MCG/ACT inhaler 1-2 puff  1-2 puff Inhalation Q4H PRN Charlsie Quest, MD   2 puff at 03/05/21 0933  . asenapine (SAPHRIS) sublingual tablet 5 mg  5 mg Sublingual BID PRN Candelaria Stagers T, MD      . aspirin chewable tablet 81 mg  81 mg Oral Daily Charlsie Quest, MD   81 mg at 03/05/21 0929  . atorvastatin (LIPITOR) tablet 40 mg  40 mg Oral Daily Darreld Mclean R, MD   40 mg at 03/05/21 0930  . budesonide (PULMICORT) nebulizer solution 0.5 mg  0.5 mg Nebulization BID Gonfa, Taye T, MD      . carvedilol (COREG) tablet 3.125 mg  3.125 mg Oral BID WC Darreld Mclean R, MD   3.125 mg at 03/05/21 0929  . divalproex (DEPAKOTE ER) 24 hr tablet 500 mg  500 mg Oral BID Darreld Mclean R, MD   500 mg at 03/03/21 0933  . doxycycline (VIBRA-TABS) tablet 100 mg  100 mg Oral Q12H Darreld Mclean R, MD   100 mg at 03/05/21 0930  . enoxaparin (LOVENOX) injection 40 mg  40 mg Subcutaneous Q24H Darreld Mclean R, MD   40 mg at 03/04/21 2100  . furosemide (LASIX) tablet 40 mg  40 mg Oral Daily Candelaria Stagers T, MD   40 mg at 03/05/21 0930  . gabapentin (NEURONTIN) capsule 600 mg  600 mg Oral TID Charlsie Quest, MD   600 mg at 03/05/21 0930  . guaiFENesin (MUCINEX) 12 hr tablet 600 mg  600 mg Oral BID Darreld Mclean R, MD   600 mg at 03/04/21 1003  . hydrALAZINE (APRESOLINE) tablet 25 mg  25 mg Oral Q8H Gonfa, Taye T, MD   25 mg at 03/05/21 0930  . ipratropium-albuterol (DUONEB) 0.5-2.5 (3) MG/3ML nebulizer solution 3 mL  3 mL Nebulization Q6H PRN Charlsie Quest, MD   3 mL at 03/05/21 0550  . ipratropium-albuterol (DUONEB) 0.5-2.5 (3) MG/3ML nebulizer solution 3 mL  3 mL Nebulization TID Candelaria Stagers T, MD   3 mL at 03/05/21 1306  . isosorbide mononitrate (IMDUR) 24 hr tablet 15 mg  15 mg Oral Daily Gonfa, Taye T, MD   15 mg at 03/04/21 1002  .  levETIRAcetam (KEPPRA) tablet 500 mg  500 mg Oral BID Darreld Mclean R, MD   500 mg at 03/05/21 0930  . methocarbamol (ROBAXIN) tablet 750 mg  750 mg Oral Q8H PRN Candelaria Stagers T, MD   750 mg at 03/03/21 0950  . nicotine (NICODERM CQ - dosed in mg/24 hours) patch 21 mg  21 mg Transdermal Daily Patel, Vishal R, MD      . potassium chloride (KLOR-CON) packet 20 mEq  20 mEq Oral Daily Charlsie Quest, MD   20 mEq at 03/02/21 2228  . sacubitril-valsartan (ENTRESTO) 24-26 mg per tablet  1 tablet Oral BID Charlsie Quest, MD  1 tablet at 03/05/21 0930  . spironolactone (ALDACTONE) tablet 12.5 mg  12.5 mg Oral Daily Darreld Mclean R, MD   12.5 mg at 03/05/21 0930  . traZODone (DESYREL) tablet 50 mg  50 mg Oral QHS Almon Hercules, MD         Discharge Medications: Please see discharge summary for a list of discharge medications.  Relevant Imaging Results:  Relevant Lab Results:   Additional Information [id number]  Amada Jupiter, [name]

## 2021-03-05 NOTE — Progress Notes (Signed)
Physical Therapy Treatment Patient Details Name: [name] MRN: [id number] DOB: [birth year] Today's Date: 03/05/2021    History of Present Illness Patient is a 55 year old male with PMH chronic combined systolic and diastolic CHF EF <78%, cerebellar CVA, CAD, bipolar disorder, COPD, HTN, HLD, bilateral first toe ulcer/osteomyelitis s/p bilateral first toe amputation 01/02/21, substance use disorder, and homelessness who presents to the ED for evaluation of worsening lower extremity swelling and shortness of breath.    PT Comments    Pt AxO x 3 present with expressive aphagia s/p CVA cooperative and aware. Assisted OOB to attempt amb.  General bed mobility comments: pt required increased time and present with severe R lean as well as decreased R side functional use present with tone.  uses L  bed rail to stabelize self. General transfer comment: from elevated bed attempted gait but unable due to R LE pain and effort.  General Gait Details: attempted however unable past one step away from bed with difficulty tolerating WBing thru R LE due to foot pain.  pt stated "I have to sit" and he was unable to take a step backward back to bed so had to pull bed back under him before he sat.  Then assisted with SPS to recliner and positioned to comfort.   Follow Up Recommendations  CIR;SNF     Equipment Recommendations  None recommended by PT    Recommendations for Other Services       Precautions / Restrictions Precautions Precautions: Fall Precaution Comments: can become easily agitated, likes step by step instruction at his own pace Other Brace: Supposed to have post op shoes per previous notes, but does not have them with him Restrictions Other Position/Activity Restrictions: Per previous notes, pt WBAT in post op shoes.    Mobility  Bed Mobility Overal bed mobility: Needs Assistance Bed Mobility: Supine to Sit           General bed mobility comments: pt required increased time and  present with severe R lean as well as decreased R side functional use present with tone.  uses L  bed rail to stabelize self.    Transfers Overall transfer level: Needs assistance Equipment used: Rolling walker (2 wheeled) Transfers: Sit to/from [employer] Sit to Stand: Mod assist Stand pivot transfers: Mod assist;Max assist       General transfer comment: from elevated bed attempted gait but unable due to R LE pain and effort  Ambulation/Gait Ambulation/Gait assistance: Max assist Gait Distance (Feet): 1 Feet Assistive device: Rolling walker (2 wheeled) Gait Pattern/deviations: Step-to pattern;Decreased stance time - right     General Gait Details: attempted however unable past one step away from bed with difficulty tolerating WBing thru R LE due to foot pain.  pt stated "I have to sit" and he was unable to take a step backward back to bed so had to pull bed back under him before he sat.   Stairs             Wheelchair Mobility    Modified Rankin (Stroke Patients Only)       Balance                                            Cognition Arousal/Alertness: Awake/alert Behavior During Therapy: Restless;Anxious;Impulsive;Agitated Overall Cognitive Status: Within Functional Limits for tasks assessed  General Comments: difficult to understand speech at times due to expressive difficulties hx CVA but very alert and aware.  Stated he has 4 kids, the oldest is 46.      Exercises      General Comments        Pertinent Vitals/Pain Pain Assessment: Faces Faces Pain Scale: Hurts a little bit Pain Location: B feet Pain Descriptors / Indicators: Grimacing;Guarding Pain Intervention(s): Monitored during session;Repositioned    Home Living                      Prior Function            PT Goals (current goals can now be found in the care plan section) Progress towards PT  goals: Progressing toward goals    Frequency    Min 3X/week      PT Plan Current plan remains appropriate    Co-evaluation              AM-PAC PT "6 Clicks" Mobility   Outcome Measure  Help needed turning from your back to your side while in a flat bed without using bedrails?: A Little Help needed moving from lying on your back to sitting on the side of a flat bed without using bedrails?: A Little Help needed moving to and from a bed to a chair (including a wheelchair)?: A Lot Help needed standing up from a chair using your arms (e.g., wheelchair or bedside chair)?: A Lot Help needed to walk in hospital room?: Total Help needed climbing 3-5 steps with a railing? : Total 6 Click Score: 12    End of Session Equipment Utilized During Treatment: Gait belt Activity Tolerance: Patient limited by pain Patient left: in chair Nurse Communication: Mobility status PT Visit Diagnosis: Other abnormalities of gait and mobility (R26.89);Other symptoms and signs involving the nervous system (R29.898);Difficulty in walking, not elsewhere classified (R26.2)     Time: 6063-0160 PT Time Calculation (min) (ACUTE ONLY): 34 min  Charges:  $Therapeutic Activity: 23-37 mins                     Felecia Shelling  PTA Acute  Rehabilitation Services Pager      [phone] Office      [phone]

## 2021-03-05 NOTE — Progress Notes (Signed)
Transition of Care (TOC) -30 day Note       Patient Details  Name: [name] MRN:  [id number] Date of Birth:  [birth year]   Transition of Care [facility]) CM/SW Contact  Name:  Amada Jupiter, [name] Phone Number:  [phone] Date:  03/05/21 Time:  2:53pm   MUST ID: [id number]   To Whom it May Concern:   Please be advised that the above patient will require a short-term nursing home stay, anticipated 30 days or less rehabilitation and strengthening. The plan is for return home.

## 2021-03-05 NOTE — Progress Notes (Signed)
PROGRESS NOTE  [name] ZOX:[id number] DOB: [birth year]   PCP: Pcp, No  Patient is from: [place], [facility] health.  Reported using wheelchair at baseline.  DOA: 03/02/2021 LOS: 2  Chief complaints: Shortness of breath, orthopnea and leg swelling and pain  Brief Narrative / Interim history: 55 year old M with PMH of combined CHF, cerebellar CVA with dysarthria, CAD, COPD, HTN, HLD, bipolar disorder, aggressive behavior, neuropathy, cocaine use, tobacco use, recent hospitalization from 1/17-3/6 for cerebellar CVA, CHF exacerbation and bilateral great toe osteomyelitis presenting with shortness of breath, orthopnea, edema and lower extremity pain, and admitted for acute on chronic combined CHF and possible RML pneumonia.  Patient has had 5 ED visits including this since his discharged on 3/6.  In ED, slightly tachycardic and hypertensive.  Normal saturation.  BNP, troponin and D-dimer slightly elevated. CTA and LE [place] negative for VTE.  Started on IV Lasix and antibiotics.  Pro-Cal negative.  Cefdinir discontinued given low suspicion for pneumonia.  Continued azithromycin and nebulizers for COPD.  Transitioned to p.o. Lasix.  Therapy recommended CIR but patient is not a candidate.  Likely SNF.   Subjective: Seen and examined earlier this morning.  He says his legs feel better.  Some shortness of breath.  He likes to have regular diet.  Also likes to go to rehab to get stronger.  Denies chest pain or abdominal pain.  Objective: Vitals:   03/04/21 2017 03/04/21 2055 03/05/21 0359 03/05/21 0749  BP: 127/82  (!) 141/81   Pulse: 97  87   Resp: 20     Temp: 98 F (36.7 C)  98.2 F (36.8 C)   TempSrc: Oral  Oral   SpO2: 98%  100% 99%  Weight:  86.1 kg    Height:  5\' 9"  (1.753 m)      Intake/Output Summary (Last 24 hours) at 03/05/2021 1257 Last data filed at 03/05/2021 0359 Gross per 24 hour  Intake --  Output 800 ml  Net -800 ml   Filed Weights   03/04/21 2055  Weight: 86.1 kg     Examination:  GENERAL: No apparent distress.  Nontoxic. HEENT: MMM.  Vision and hearing grossly intact.  NECK: Supple.  No apparent JVD.  RESP:  No IWOB.  Fair aeration bilaterally. CVS:  RRR. Heart sounds normal.  ABD/GI/GU: BS+. Abd soft, NTND.  MSK/EXT:  Moves extremities.  Bilateral great toe amputations.  Trace edema bilaterally. SKIN: Dry scabs over bilateral foot from his recent bilateral great toe amputation. NEURO: Awake and alert.  Fairly oriented.  No apparent focal neuro deficit. PSYCH: Appears calm her.  No distress or agitation.  Procedures:  None  Microbiology summarized: COVID-19 and influenza PCR nonreactive.  Assessment & Plan: Acute on chronic combined systolic and diastolic CHF: TTE on 1/18 with LVEF of 20%, GH, G2-DD, moderately reduced RVSF, moderate LAE and RAE.  Presents with SOB, orthopnea, DOE and edema.  BNP elevated but less than prior values. CTA and LE 2/18 negative for DVT.  Still with ongoing cocaine and tobacco use.  Had about 2.2 L UOP/24 hours.  Creatinine is stable. -Continue home Lasix 40 mg daily -Closely monitor fluid status, renal functions and electrolytes -Continue Coreg, Entresto, Aldactone and hydralazine.  Changed Isordil to Imdur.  -Encouraged to quit using cocaine and smoking cigarettes.  RML pneumonia: Ruled out.  Procalcitonin negative.  -Discontinued cefdinir -Continue doxycycline given some degree of COPD exacerbation -Continue breathing treatments  COPD with mild exacerbation: -Doxycycline as above -Scheduled and as needed DuoNeb  -  Added budesonide  History of right and left cerebellar ischemic stroke with residual dysarthria: -Continue aspirin and atorvastatin.  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  No evidence of infection apart from poor hygiene.  Elevated troponin/CAD: Elevated troponin likely demand ischemia.  No chest  pain. -Continue cardiac meds, aspirin and a statin as above  Elevated D-dimer: CTA and LE [place] negative for VTE.  Hypertension: SBP slightly elevated. -Cardiac meds and diuretics as above  Bipolar disorder/aggressive behavior/restlessness: Improved.  Appears calm her today. -Appreciate input by psych  -Deemed to have capacity per discussion with psych.  -continue home Depakote  -Saphris as needed for agitation  History of suspected seizure activity: -Continue Keppra.  Hyperlipidemia: -Continue atorvastatin.  Substance use disorder/tobacco use disorder/cocaine use-admitted use of cocaine and tobacco -Encouraged to stop using cocaine and smoking cigarettes -Continue nicotine patch  Bilateral lower extremity pain/neuropathy -Continue home gabapentin -Robaxin as needed for possible muscle spasm  Homelessness/generalized weakness/debility-reportedly uses wheelchair.  Per discharge summary on 3/6, patient was able to ambulate from his room to nursing station prior to discharge. -Therapy recommended CIR but not a candidate -TOC consulted for SNF placement  Leukopenia/neutropenia: Improved. -Recheck in the morning   Overweight Body mass index is 28.03 kg/m.         DVT prophylaxis:  enoxaparin (LOVENOX) injection 40 mg Start: 03/02/21 2200  Code Status: Full code Family Communication: patient has no close family/acquaintances  Level of care: Telemetry Status is: Inpatient  Remains inpatient appropriate because:Unsafe d/c plan   Dispo: The patient is from: Homeless              Anticipated d/c is to: SNF              Patient currently is medically stable to d/c.   Difficult to place patient No           Consultants:  Psychiatry   Sch Meds:  Scheduled Meds: . aspirin  81 mg Oral Daily  . atorvastatin  40 mg Oral Daily  . carvedilol  3.125 mg Oral BID WC  . divalproex  500 mg Oral BID  . doxycycline  100 mg Oral Q12H  . enoxaparin (LOVENOX)  injection  40 mg Subcutaneous Q24H  . furosemide  40 mg Oral Daily  . gabapentin  600 mg Oral TID  . guaiFENesin  600 mg Oral BID  . hydrALAZINE  25 mg Oral Q8H  . ipratropium-albuterol  3 mL Nebulization TID  . isosorbide mononitrate  15 mg Oral Daily  . levETIRAcetam  500 mg Oral BID  . nicotine  21 mg Transdermal Daily  . potassium chloride  20 mEq Oral Daily  . sacubitril-valsartan  1 tablet Oral BID  . spironolactone  12.5 mg Oral Daily  . traZODone  50 mg Oral QHS   Continuous Infusions: PRN Meds:.albuterol, asenapine, ipratropium-albuterol, methocarbamol  Antimicrobials: Anti-infectives (From admission, onward)   Start     Dose/Rate Route Frequency Ordered Stop   03/02/21 2200  doxycycline (VIBRA-TABS) tablet 100 mg        100 mg Oral Every 12 hours 03/02/21 1954 03/07/21 2159   03/02/21 2200  cefdinir (OMNICEF) capsule 300 mg  Status:  Discontinued        300 mg Oral Every 12 hours 03/02/21 1954 03/03/21 1014       I have personally reviewed the following labs and images: CBC: Recent Labs  Lab 03/02/21 1530 03/03/21 0426  03/05/21 0538  WBC 4.5 3.3* 4.0  NEUTROABS 2.1  --  0.8*  HGB 14.3 12.2* 13.4  HCT 43.6 36.8* 40.8  MCV 95.0 96.1 95.3  PLT 291 263 261   BMP &GFR Recent Labs  Lab 03/02/21 1530 03/03/21 0426 03/05/21 0538  NA 136 140 136  K 3.7 3.8 3.6  CL 100 105 104  CO2 26 25 23   GLUCOSE 81 95 84  BUN 14 14 12   CREATININE 1.11 1.07 1.06  CALCIUM 8.8* 8.7* 8.6*  MG  --  1.9 1.8  PHOS  --   --  4.1   Estimated Creatinine Clearance: 88.7 mL/min (by C-G formula based on SCr of 1.06 mg/dL). Liver & Pancreas: Recent Labs  Lab 03/02/21 1530 03/05/21 0538  AST 19  --   ALT 11  --   ALKPHOS 78  --   BILITOT 1.0  --   PROT 7.3  --   ALBUMIN 3.6 3.3*   No results for input(s): LIPASE, AMYLASE in the last 168 hours. No results for input(s): AMMONIA in the last 168 hours. Diabetic: No results for input(s): HGBA1C in the last 72 hours. No  results for input(s): GLUCAP in the last 168 hours. Cardiac Enzymes: No results for input(s): CKTOTAL, CKMB, CKMBINDEX, TROPONINI in the last 168 hours. No results for input(s): PROBNP in the last 8760 hours. Coagulation Profile: No results for input(s): INR, PROTIME in the last 168 hours. Thyroid Function Tests: No results for input(s): TSH, T4TOTAL, FREET4, T3FREE, THYROIDAB in the last 72 hours. Lipid Profile: No results for input(s): CHOL, HDL, LDLCALC, TRIG, CHOLHDL, LDLDIRECT in the last 72 hours. Anemia Panel: No results for input(s): VITAMINB12, FOLATE, FERRITIN, TIBC, IRON, RETICCTPCT in the last 72 hours. Urine analysis:    Component Value Date/Time   COLORURINE AMBER (A) 02/09/2021 1455   APPEARANCEUR CLEAR 02/09/2021 1455   LABSPEC 1.030 02/09/2021 1455   PHURINE 5.0 02/09/2021 1455   GLUCOSEU NEGATIVE 02/09/2021 1455   HGBUR NEGATIVE 02/09/2021 1455   BILIRUBINUR NEGATIVE 02/09/2021 1455   KETONESUR 5 (A) 02/09/2021 1455   PROTEINUR 30 (A) 02/09/2021 1455   NITRITE NEGATIVE 02/09/2021 1455   LEUKOCYTESUR NEGATIVE 02/09/2021 1455   Sepsis Labs: Invalid input(s): PROCALCITONIN, LACTICIDVEN  Microbiology: Recent Results (from the past 240 hour(s))  Resp Panel by RT-PCR (Flu A&B, Covid) Nasopharyngeal Swab     Status: None   Collection Time: 03/02/21  6:30 PM   Specimen: Nasopharyngeal Swab; Nasopharyngeal(NP) swabs in vial transport medium  Result Value Ref Range Status   SARS Coronavirus 2 by RT PCR NEGATIVE NEGATIVE Final    Comment: (NOTE) SARS-CoV-2 target nucleic acids are NOT DETECTED.  The SARS-CoV-2 RNA is generally detectable in upper respiratory specimens during the acute phase of infection. The lowest concentration of SARS-CoV-2 viral copies this assay can detect is 138 copies/mL. A negative result does not preclude SARS-Cov-2 infection and should not be used as the sole basis for treatment or other patient management decisions. A negative result may  occur with  improper specimen collection/handling, submission of specimen other than nasopharyngeal swab, presence of viral mutation(s) within the areas targeted by this assay, and inadequate number of viral copies(<138 copies/mL). A negative result must be combined with clinical observations, patient history, and epidemiological information. The expected result is Negative.  Fact Sheet for Patients:  BloggerCourse.com  Fact Sheet for Healthcare Providers:  SeriousBroker.it  This test is no t yet approved or cleared by the [place] FDA and  has  been authorized for detection and/or diagnosis of SARS-CoV-2 by FDA under an Emergency Use Authorization (EUA). This EUA will remain  in effect (meaning this test can be used) for the duration of the COVID-19 declaration under Section 564(b)(1) of the Act, 21 U.S.C.section 360bbb-3(b)(1), unless the authorization is terminated  or revoked sooner.       Influenza A by PCR NEGATIVE NEGATIVE Final   Influenza B by PCR NEGATIVE NEGATIVE Final    Comment: (NOTE) The Xpert Xpress SARS-CoV-2/FLU/RSV plus assay is intended as an aid in the diagnosis of influenza from Nasopharyngeal swab specimens and should not be used as a sole basis for treatment. Nasal washings and aspirates are unacceptable for Xpert Xpress SARS-CoV-2/FLU/RSV testing.  Fact Sheet for Patients: BloggerCourse.com  Fact Sheet for Healthcare Providers: SeriousBroker.it  This test is not yet approved or cleared by the [place] FDA and has been authorized for detection and/or diagnosis of SARS-CoV-2 by FDA under an Emergency Use Authorization (EUA). This EUA will remain in effect (meaning this test can be used) for the duration of the COVID-19 declaration under Section 564(b)(1) of the Act, 21 U.S.C. section 360bbb-3(b)(1), unless the authorization is terminated  or revoked.  Performed at [facility], [address] [place], [name] [address]     Radiology Studies: No results found.     [name] T. Iyona Pehrson [facility]  If 7PM-7AM, please contact night-coverage [email or web address] 03/05/2021, 12:57 PM

## 2021-03-05 NOTE — TOC Initial Note (Signed)
Transition of Care [facility]) - Initial/Assessment Note    Patient Details  Name: [name] MRN: [id number] Date of Birth: 23-Jan-1966  Transition of Care [facility]) CM/SW Contact:    Lennart Pall, [name] Phone Number: 03/05/2021, 2:37 PM  Clinical Narrative:                 Met with patient to introduce self and discuss therapy recommendations for SNF rehab.  Pt is cooperative overall but somewhat difficult to understand.  Reports he has worked with therapies here and is agreeable to SNF plan. He notes he has no family who can offer any assistance and confirms that he is currently homeless.  He has begun receiving Medicaid coverage which may make SNF plan a true option.  Have begun PASRR with anticipation he will need Level 2 screening which could take a few days to complete.  TOC will continue to work on this dc plan as pt is agreeable.    Expected Discharge Plan: Skilled Nursing Facility Barriers to Discharge: Continued Medical Work up,Awaiting State Approval Rosalie Gums)   Patient Goals and CMS Choice Patient states their goals for this hospitalization and ongoing recovery are:: go to rehab CMS Medicare.gov Compare Post Acute Care list provided to:: Patient    Expected Discharge Plan and Services Expected Discharge Plan: [place] In-house Referral: Clinical Social Work   Post Acute Care Choice: [facility] Living arrangements for the past 2 months: Homeless                                      Prior Living Arrangements/Services Living arrangements for the past 2 months: Homeless Lives with:: Self Patient language and need for interpreter reviewed:: Yes        Need for Family Participation in Patient Care: Yes (Comment) Care giver support system in place?: No (comment)   Criminal Activity/Legal Involvement Pertinent to Current Situation/Hospitalization: No - Comment as needed  Activities of Daily Living Home Assistive Devices/Equipment: None ADL  Screening (condition at time of admission) Patient's cognitive ability adequate to safely complete daily activities?: No Is the patient deaf or have difficulty hearing?: No Does the patient have difficulty seeing, even when wearing glasses/contacts?: No Does the patient have difficulty concentrating, remembering, or making decisions?: No Patient able to express need for assistance with ADLs?: Yes Does the patient have difficulty dressing or bathing?: Yes Independently performs ADLs?: No Does the patient have difficulty walking or climbing stairs?: Yes Weakness of Legs: Both Weakness of Arms/Hands: None  Permission Sought/Granted Permission sought to share information with : Chartered certified accountant granted to share information with : Yes, Verbal Permission Granted     Permission granted to share info w AGENCY: area SNFs        Emotional Assessment Appearance:: Appears stated age Attitude/Demeanor/Rapport: Complaining Affect (typically observed): Guarded Orientation: : Oriented to Self,Oriented to Place,Oriented to  Time,Oriented to Situation,Fluctuating Orientation (Suspected and/or reported Sundowners) Alcohol / Substance Use: Illicit Drugs Psych Involvement: Yes (comment)  Admission diagnosis:  Acute on chronic combined systolic (congestive) and diastolic (congestive) heart failure (HCC) [I50.43] Bilateral foot pain [M79.671, M79.672] Localized swelling of both lower legs [R22.43] Patient Active Problem List   Diagnosis Date Noted  . Acute on chronic combined systolic (congestive) and diastolic (congestive) heart failure ([place]) 03/02/2021  . History of stroke 03/02/2021  . Substance use disorder 03/02/2021  . Skin ulcer of right great  toe with necrosis of bone ([place])   . Encounter for assessment of healthcare decision-making capacity   . Pain, dental   . Dystonia   . Dysarthria   . Shock liver 01/04/2021  . Acute metabolic encephalopathy 71/21/9758  . CAD  (coronary artery disease) 01/04/2021  . HTN (hypertension) 01/04/2021  . Bipolar I disorder ([place]) 01/04/2021  . Cocaine abuse ([place]) 01/04/2021  . Homelessness 01/04/2021  . HLD (hyperlipidemia) 01/04/2021  . Osteomyelitis of great toe ([place])   . Ulcer of great toe, left, with necrosis of bone ([place])   . Ulcer of great toe, right, with necrosis of muscle ([place])   . Acute osteomyelitis of toe, left ([place])   . Acute on chronic combined systolic and diastolic CHF (congestive heart failure) ([place]) 12/20/2020  . Respiratory failure ([place])   . Ischemic stroke ([place]) 12/17/2020  . 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 Diabetic foot ulcer ([place]) 12/08/2020  . CHF (congestive heart failure) ([place]) 11/14/2020  . CHF exacerbation ([place]) 11/13/2020  . Hypokalemia   . Hyperlipidemia   . Acute respiratory failure with hypoxia ([place]) 10/26/2020  . COPD with acute exacerbation ([place]) 10/14/2020  . Acute on chronic combined systolic and diastolic CHF (congestive heart failure) ([place]) 10/02/2020  . Near syncope 07/21/2020  . Schizoaffective disorder ([place]) 06/24/2019  . SOB (shortness of breath)   . QT prolongation 05/25/2019  . Cocaine abuse ([place])   . Elevated troponin   . Homeless   . Suicidal ideation   . Bilateral foot pain 04/05/2019  . Housing situation unstable 03/28/2019  . Bilateral lower extremity edema   . Asthma 12/26/2016  . Tobacco use disorder 12/26/2016  . Schizophrenia ([place]) 12/26/2016  . Cocaine abuse with cocaine-induced mood disorder ([place]) 09/06/2016  . Alcohol use disorder, mild, abuse 07/31/2016  . Substance induced mood disorder ([place]) 07/30/2016  . Schizoaffective disorder, bipolar type ([place]) 05/31/2016  . Cocaine use disorder, severe, dependence ([place]) 05/31/2016  . Hypertension 05/31/2016   PCP:  Pcp, No Pharmacy:   [facility], [address] [place] [address] Phone: [phone] Fax: [phone]     Social  Determinants of Health (SDOH) Interventions    Readmission Risk Interventions Readmission Risk Prevention Plan 01/07/2021 12/10/2020 10/15/2020  Transportation Screening Complete Complete Complete  PCP or Specialist Appt within 3-5 Days Complete - -  HRI or Home Care Consult Complete - -  Social Work Consult for [place] Planning/Counseling Complete - -  Palliative Care Screening Complete - -  Medication Review Press photographer) Complete Referral to Pharmacy Complete  PCP or Specialist appointment within 3-5 days of discharge - Complete Complete  HRI or [place] - Complete Complete  SW Recovery Care/Counseling Consult - Complete Complete  Palliative Care Screening - Not Applicable Not Applicable  Skilled Nursing Facility - Not Applicable Not Applicable  Some recent data might be hidden

## 2021-03-06 DIAGNOSIS — F1414 Cocaine abuse with cocaine-induced mood disorder: Secondary | ICD-10-CM

## 2021-03-06 DIAGNOSIS — M79672 Pain in left foot: Secondary | ICD-10-CM

## 2021-03-06 DIAGNOSIS — M79671 Pain in right foot: Secondary | ICD-10-CM

## 2021-03-06 LAB — RENAL FUNCTION PANEL
Albumin: 3.3 g/dL — ABNORMAL LOW (ref 3.5–5.0)
Anion gap: 8 (ref 5–15)
BUN: 12 mg/dL (ref 6–20)
CO2: 24 mmol/L (ref 22–32)
Calcium: 8.8 mg/dL — ABNORMAL LOW (ref 8.9–10.3)
Chloride: 106 mmol/L (ref 98–111)
Creatinine, Ser: 1.02 mg/dL (ref 0.61–1.24)
GFR, Estimated: >60 mL/min (ref >60)
Glucose, Bld: 132 mg/dL — ABNORMAL HIGH (ref 70–99)
Phosphorus: 3.8 mg/dL (ref 2.5–4.6)
Potassium: 4.1 mmol/L (ref 3.5–5.1)
Sodium: 138 mmol/L (ref 135–145)

## 2021-03-06 LAB — MAGNESIUM: Magnesium: 1.8 mg/dL (ref 1.7–2.4)

## 2021-03-06 MED ORDER — GUAIFENESIN-DM 100-10 MG/5ML PO SYRP
5.0000 mL | ORAL_SOLUTION | Freq: Four times a day (QID) | ORAL | Status: DC | PRN
  Administered 2021-03-06 – 2021-03-12 (×14): 5 mL via ORAL
  Filled 2021-03-06 (×15): qty 10

## 2021-03-06 MED ORDER — HYDROCERIN EX CREA
TOPICAL_CREAM | Freq: Two times a day (BID) | CUTANEOUS | Status: DC
  Administered 2021-03-08: 1 via TOPICAL
  Filled 2021-03-06: qty 113

## 2021-03-06 MED ORDER — IPRATROPIUM-ALBUTEROL 0.5-2.5 (3) MG/3ML IN SOLN
3.0000 mL | Freq: Four times a day (QID) | RESPIRATORY_TRACT | Status: DC
  Administered 2021-03-06 (×3): 3 mL via RESPIRATORY_TRACT
  Filled 2021-03-06 (×3): qty 3

## 2021-03-06 MED ORDER — IPRATROPIUM-ALBUTEROL 0.5-2.5 (3) MG/3ML IN SOLN
3.0000 mL | RESPIRATORY_TRACT | Status: DC
  Administered 2021-03-06 – 2021-03-11 (×25): 3 mL via RESPIRATORY_TRACT
  Filled 2021-03-06 (×26): qty 3

## 2021-03-06 NOTE — TOC Progression Note (Signed)
Transition of Care [facility]) - Progression Note    Patient Details  Name: [name] MRN: [id number] Date of Birth: 11/07/1966  [facility] Care [facility]) CM/SW Contact  Lennart Pall, [name] Phone Number: 03/06/2021, 12:48 PM  Clinical Narrative:    Met with pt today who remains agreeable with plan for SNF and asks that I look for facilities "anywhere... I'll go to [place] if I need to."  Continue to await completion of Level 2 PASRR but have submitted FL2 out to all area facilities.     Expected Discharge Plan: Skilled Nursing Facility Barriers to Discharge: Continued Medical Work up,Awaiting State Approval (PASRR)  Expected Discharge Plan and Services Expected Discharge Plan: [place] In-house Referral: Clinical Social Work   Post Acute Care Choice: [place] Living arrangements for the past 2 months: Homeless                                       Social Determinants of Health (SDOH) Interventions    Readmission Risk Interventions Readmission Risk Prevention Plan 03/05/2021 01/07/2021 12/10/2020  Transportation Screening Complete Complete Complete  PCP or Specialist Appt within 3-5 Days - Complete -  HRI or [place] - Complete -  Social Work Consult for [place] Planning/Counseling - Complete -  Palliative Care Screening - Complete -  Medication Review Press photographer) Complete Complete Referral to Pharmacy  PCP or Specialist appointment within 3-5 days of discharge Complete - Complete  HRI or Home Care Consult Complete - Complete  SW Recovery Care/Counseling Consult Complete - Complete  Palliative Care Screening Not Applicable - Not Applicable  Skilled Nursing Facility Complete - Not Applicable  Some recent data might be hidden

## 2021-03-06 NOTE — Progress Notes (Signed)
PROGRESS NOTE    [name]  NLG:[id number] DOB: Apr 14, 1966 DOA: 03/02/2021 PCP: Oneita [place], No    Brief Narrative:  55 year old M with PMH of combined CHF, cerebellar CVA with dysarthria, CAD, COPD, HTN, HLD, bipolar disorder, aggressive behavior, neuropathy, cocaine use, tobacco use, recent hospitalization from 1/17-3/6 for cerebellar CVA, CHF exacerbation and bilateral great toe osteomyelitis presenting with shortness of breath, orthopnea, edema and lower extremity pain, and admitted for acute on chronic combined CHF and possible RML pneumonia.  Patient has had 5 ED visits including this since his discharged on 3/6.  In ED, slightly tachycardic and hypertensive.  Normal saturation.  BNP, troponin and D-dimer slightly elevated. CTA and LE [place] negative for VTE.  Started on IV Lasix and antibiotics.  Pro-Cal negative.  Cefdinir discontinued given low suspicion for pneumonia.  Continued azithromycin and nebulizers for COPD.  Transitioned to p.o. Lasix.  Assessment & Plan:   Principal Problem:   Acute on chronic combined systolic (congestive) and diastolic (congestive) heart failure (HCC) Active Problems:   Schizoaffective disorder, bipolar type (HCC)   Cocaine abuse with cocaine-induced mood disorder (HCC)   Hyperlipidemia   CAD (coronary artery disease)   HTN (hypertension)   History of stroke   Substance use disorder  Acute on chronic combined systolic and diastolic CHF: TTE on 1/18 with LVEF of 20%, GH, G2-DD, moderately reduced RVSF, moderate LAE and RAE.  Presents with SOB, orthopnea, DOE and edema.  BNP elevated but less than prior values. CTA and LE [place] negative for DVT.  Still with ongoing cocaine and tobacco use.  Had about 2.2 L UOP/24 hours.  Creatinine is stable. -Pt is continued on home Lasix 40 mg daily -Closely monitor fluid status, renal functions and electrolytes -Continue Coreg, Entresto, Aldactone and hydralazine.  Changed Isordil to Imdur.  -Recommend cessation againse  cocaine and smoking cigarettes.  RML pneumonia: Ruled out.  Procalcitonin negative.  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  No evidence of infection apart from poor hygiene.  Elevated troponin/CAD: Elevated troponin likely demand ischemia.  No chest pain. -Continue cardiac meds, aspirin and a statin as above  Elevated D-dimer: CTA and LE [place] negative for VTE.  Hypertension: SBP slightly elevated. -Cardiac meds and diuretics as above  Bipolar disorder/aggressive behavior/restlessness: Improved.  -conversing appropriately today -Appreciate input by psych             -Deemed to have capacity per discussion with psych.             -continue home Depakote             -Continue with saphris as needed for agitation  History of suspected seizure activity: -Continue Keppra.  Hyperlipidemia: -Continue atorvastatin.  Substance use disorder/tobacco use disorder/cocaine use-admitted use of cocaine and tobacco -Encouraged to stop using cocaine and smoking cigarettes -Continue nicotine patch  Bilateral lower extremity pain/neuropathy -Continue home gabapentin -Robaxin as needed for possible muscle spasm  Homelessness/generalized weakness/debility-reportedly uses wheelchair.  Per discharge summary on 3/6, patient was able to ambulate from his room to nursing station prior to discharge. -Therapy recommended CIR but not a candidate -TOC following for possible SNF  placement  Leukopenia/neutropenia: Improved. -Recheck in the  morning  Overweight Body mass index is 28.03 kg/m.  DVT prophylaxis: Lovenox subq Code Status: Full Family Communication: Pt in room, family not at bedside  Status is: Inpatient  Remains inpatient appropriate because:Unsafe d/c plan   Dispo: The patient is from: Home              Anticipated d/c is to: SNF              Patient currently is not medically stable to d/c.   Difficult to place patient No       Consultants:   Psychiatry  Procedures:     Antimicrobials: Anti-infectives (From admission, onward)   Start     Dose/Rate Route Frequency Ordered Stop   03/02/21 2200  doxycycline (VIBRA-TABS) tablet 100 mg        100 mg Oral Every 12 hours 03/02/21 1954 03/07/21 2159   03/02/21 2200  cefdinir (OMNICEF) capsule 300 mg  Status:  Discontinued        300 mg Oral Every 12 hours 03/02/21 1954 03/03/21 1014       Subjective: Reports coughing and dry skin on his feet  Objective: Vitals:   03/06/21 0500 03/06/21 0732 03/06/21 1119 03/06/21 1132  BP:    93/76  Pulse:    (!) 114  Resp:    18  Temp:    98.7 F (37.1 C)  TempSrc:      SpO2:  100% 99%   Weight: 85.2 kg     Height:        Intake/Output Summary (Last 24 hours) at 03/06/2021 1350 Last data filed at 03/06/2021 1200 Gross per 24 hour  Intake 720 ml  Output 3000 ml  Net -2280 ml   Filed Weights   03/04/21 2055 03/06/21 0500  Weight: 86.1 kg 85.2 kg    Examination: General exam: Awake, laying in bed, in nad Respiratory system: Normal respiratory effort, no wheezing Cardiovascular system: regular rate, s1, s2 Gastrointestinal system: Soft, nondistended, positive BS Central nervous system: CN2-12 grossly intact, strength intact Extremities: Perfused, no clubbing, s/p B partial foot amputation Skin: Normal skin turgor, no notable skin lesions seen Psychiatry: Mood normal // no visual hallucinations   Data Reviewed: I have personally reviewed following labs and imaging studies  CBC: Recent  Labs  Lab 03/02/21 1530 03/03/21 0426 03/05/21 0538  WBC 4.5 3.3* 4.0  NEUTROABS 2.1  --  0.8*  HGB 14.3 12.2* 13.4  HCT 43.6 36.8* 40.8  MCV 95.0 96.1 95.3  PLT 291 263 261   Basic Metabolic Panel: Recent Labs  Lab 03/02/21 1530 03/03/21 0426 03/05/21 0538 03/06/21 0938  NA 136 140 136 138  K 3.7 3.8 3.6 4.1  CL 100 105 104 106  CO2 26 25 23 24   GLUCOSE 81 95 84 132*  BUN 14 14 12 12   CREATININE 1.11 1.07 1.06 1.02  CALCIUM 8.8* 8.7* 8.6* 8.8*  MG  --  1.9 1.8 1.8  PHOS  --   --  4.1 3.8   GFR: Estimated Creatinine Clearance: 91.7 mL/min (by C-G formula based on SCr of 1.02 mg/dL). Liver Function Tests: Recent Labs  Lab 03/02/21 1530 03/05/21 0538 03/06/21 0938  AST 19  --   --   ALT 11  --   --   ALKPHOS 78  --   --   BILITOT 1.0  --   --   PROT 7.3  --   --  ALBUMIN 3.6 3.3* 3.3*   No results for input(s): LIPASE, AMYLASE in the last 168 hours. No results for input(s): AMMONIA in the last 168 hours. Coagulation Profile: No results for input(s): INR, PROTIME in the last 168 hours. Cardiac Enzymes: No results for input(s): CKTOTAL, CKMB, CKMBINDEX, TROPONINI in the last 168 hours. BNP (last 3 results) No results for input(s): PROBNP in the last 8760 hours. HbA1C: No results for input(s): HGBA1C in the last 72 hours. CBG: No results for input(s): GLUCAP in the last 168 hours. Lipid Profile: No results for input(s): CHOL, HDL, LDLCALC, TRIG, CHOLHDL, LDLDIRECT in the last 72 hours. Thyroid Function Tests: No results for input(s): TSH, T4TOTAL, FREET4, T3FREE, THYROIDAB in the last 72 hours. Anemia Panel: No results for input(s): VITAMINB12, FOLATE, FERRITIN, TIBC, IRON, RETICCTPCT in the last 72 hours. Sepsis Labs: Recent Labs  Lab 03/03/21 0522 03/05/21 0538  PROCALCITON <0.10 <0.10    Recent Results (from the past 240 hour(s))  Resp Panel by RT-PCR (Flu A&B, Covid) Nasopharyngeal Swab     Status: None   Collection Time: 03/02/21  6:30 PM    Specimen: Nasopharyngeal Swab; Nasopharyngeal(NP) swabs in vial transport medium  Result Value Ref Range Status   SARS Coronavirus 2 by RT PCR NEGATIVE NEGATIVE Final    Comment: (NOTE) SARS-CoV-2 target nucleic acids are NOT DETECTED.  The SARS-CoV-2 RNA is generally detectable in upper respiratory specimens during the acute phase of infection. The lowest concentration of SARS-CoV-2 viral copies this assay can detect is 138 copies/mL. A negative result does not preclude SARS-Cov-2 infection and should not be used as the sole basis for treatment or other patient management decisions. A negative result may occur with  improper specimen collection/handling, submission of specimen other than nasopharyngeal swab, presence of viral mutation(s) within the areas targeted by this assay, and inadequate number of viral copies(<138 copies/mL). A negative result must be combined with clinical observations, patient history, and epidemiological information. The expected result is Negative.  Fact Sheet for Patients:  BloggerCourse.com  Fact Sheet for Healthcare Providers:  SeriousBroker.it  This test is no t yet approved or cleared by the [place] FDA and  has been authorized for detection and/or diagnosis of SARS-CoV-2 by FDA under an Emergency Use Authorization (EUA). This EUA will remain  in effect (meaning this test can be used) for the duration of the COVID-19 declaration under Section 564(b)(1) of the Act, 21 U.S.C.section 360bbb-3(b)(1), unless the authorization is terminated  or revoked sooner.       Influenza A by PCR NEGATIVE NEGATIVE Final   Influenza B by PCR NEGATIVE NEGATIVE Final    Comment: (NOTE) The Xpert Xpress SARS-CoV-2/FLU/RSV plus assay is intended as an aid in the diagnosis of influenza from Nasopharyngeal swab specimens and should not be used as a sole basis for treatment. Nasal washings and aspirates are  unacceptable for Xpert Xpress SARS-CoV-2/FLU/RSV testing.  Fact Sheet for Patients: BloggerCourse.com  Fact Sheet for Healthcare Providers: SeriousBroker.it  This test is not yet approved or cleared by the [place] FDA and has been authorized for detection and/or diagnosis of SARS-CoV-2 by FDA under an Emergency Use Authorization (EUA). This EUA will remain in effect (meaning this test can be used) for the duration of the COVID-19 declaration under Section 564(b)(1) of the Act, 21 U.S.C. section 360bbb-3(b)(1), unless the authorization is terminated or revoked.  Performed at [facility], [address] [place], [name] [address]      Radiology Studies: No  results found.  Scheduled Meds: . aspirin  81 mg Oral Daily  . atorvastatin  40 mg Oral Daily  . budesonide (PULMICORT) nebulizer solution  0.5 mg Nebulization BID  . carvedilol  3.125 mg Oral BID WC  . divalproex  500 mg Oral BID  . doxycycline  100 mg Oral Q12H  . enoxaparin (LOVENOX) injection  40 mg Subcutaneous Q24H  . furosemide  40 mg Oral Daily  . gabapentin  600 mg Oral TID  . guaiFENesin  600 mg Oral BID  . hydrALAZINE  25 mg Oral Q8H  . ipratropium-albuterol  3 mL Nebulization QID  . isosorbide mononitrate  15 mg Oral Daily  . levETIRAcetam  500 mg Oral BID  . nicotine  21 mg Transdermal Daily  . potassium chloride  20 mEq Oral Daily  . sacubitril-valsartan  1 tablet Oral BID  . spironolactone  12.5 mg Oral Daily  . traZODone  50 mg Oral QHS   Continuous Infusions:   LOS: 3 days   Rickey Barbara, MD [facility] Pager On Amion  If 7PM-7AM, please contact night-coverage 03/06/2021, 1:50 PM

## 2021-03-07 NOTE — Progress Notes (Signed)
Pt refused RT to listen to breath sounds.

## 2021-03-07 NOTE — Progress Notes (Signed)
OT Cancellation Note  Patient Details Name: [name] MRN: [id number] DOB: May 16, 1966   Cancelled Treatment:    Reason Eval/Treat Not Completed: Patient declined, no reason specified. Communication during visit difficult due to patient's dysarthria. Patient initially agreeable to therapy and stating he wants to try to get to the bathroom. After patient uncovered and therapist asked patient to begin to move his legs to the side of the bed patient's affect became somewhat aggressive and then refused therapy - asking for the social worker and telling the therapist to "just leave." Due to difficulty with understanding patient's speech unsure of the specific provocation.   Kaity Pitstick L Andreal Vultaggio 03/07/2021, 11:06 AM

## 2021-03-07 NOTE — Progress Notes (Signed)
PROGRESS NOTE    [name]  JFH:[id number] DOB: 04-03-1966 DOA: 03/02/2021 PCP: Oneita Hurt, No    Brief Narrative:  55 year old M with PMH of combined CHF, cerebellar CVA with dysarthria, CAD, COPD, HTN, HLD, bipolar disorder, aggressive behavior, neuropathy, cocaine use, tobacco use, recent hospitalization from 1/17-3/6 for cerebellar CVA, CHF exacerbation and bilateral great toe osteomyelitis presenting with shortness of breath, orthopnea, edema and lower extremity pain, and admitted for acute on chronic combined CHF and possible RML pneumonia.  Patient has had 5 ED visits including this since his discharged on 3/6.  In ED, slightly tachycardic and hypertensive.  Normal saturation.  BNP, troponin and D-dimer slightly elevated. CTA and LE [place] negative for VTE.  Started on IV Lasix and antibiotics.  Pro-Cal negative.  Cefdinir discontinued given low suspicion for pneumonia.  Continued azithromycin and nebulizers for COPD.  Transitioned to p.o. Lasix.  Assessment & Plan:   Principal Problem:   Acute on chronic combined systolic (congestive) and diastolic (congestive) heart failure (HCC) Active Problems:   Schizoaffective disorder, bipolar type (HCC)   Cocaine abuse with cocaine-induced mood disorder (HCC)   Hyperlipidemia   CAD (coronary artery disease)   HTN (hypertension)   History of stroke   Substance use disorder  Acute on chronic combined systolic and diastolic CHF: TTE on 1/18 with LVEF of 20%, GH, G2-DD, moderately reduced RVSF, moderate LAE and RAE.  Presents with SOB, orthopnea, DOE and edema.  BNP elevated but less than prior values. CTA and LE [place] negative for DVT.  Still with ongoing cocaine and tobacco use.  Had about 2.2 L UOP/24 hours.  Creatinine is stable. -Pt is continued on home Lasix 40 mg daily -Closely monitor fluid status, renal functions and electrolytes -Continue Coreg, Entresto, Aldactone and hydralazine.  Changed Isordil to Imdur.  -Recommend continued cessation  againse cocaine and smoking cigarettes.  RML pneumonia: Ruled out.  Procalcitonin negative.  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  No evidence of infection apart from poor hygiene.  Elevated troponin/CAD: Elevated troponin likely demand ischemia.  No chest pain. -Continue cardiac meds, aspirin and a statin as above  Elevated D-dimer: CTA and LE [place] negative for VTE.  Hypertension: SBP slightly elevated. -Cardiac meds and diuretics as above  Bipolar disorder/aggressive behavior/restlessness: Improved.  -conversing appropriately today -Appreciate input by psych             -Deemed to have capacity per discussion with psych.             -continue home Depakote             -Continue with saphris as needed for agitation  History of suspected seizure activity: -Continue Keppra.  Hyperlipidemia: -Continue atorvastatin.  Substance use disorder/tobacco use disorder/cocaine use-admitted use of cocaine and tobacco -Encouraged to stop using cocaine and smoking cigarettes -Continue nicotine patch  Bilateral lower extremity pain/neuropathy -Continue home gabapentin -Robaxin as needed for possible muscle spasm  Homelessness/generalized weakness/debility-reportedly uses wheelchair.  Per discharge summary on 3/6, patient was able to ambulate from his room to nursing station prior to discharge. -Therapy recommended CIR but not a candidate -TOC following for possible  SNF placement  Leukopenia/neutropenia:  Improved. -Recheck in the morning  Overweight Body mass index is 28.03 kg/m.  DVT prophylaxis: Lovenox subq Code Status: Full Family Communication: Pt in room, family not at bedside  Status is: Inpatient  Remains inpatient appropriate because:Unsafe d/c plan   Dispo: The patient is from: Home              Anticipated d/c is to: SNF              Patient currently is not medically stable to d/c.   Difficult to place patient No  Consultants:   Psychiatry  Procedures:     Antimicrobials: Anti-infectives (From admission, onward)   Start     Dose/Rate Route Frequency Ordered Stop   03/02/21 2200  doxycycline (VIBRA-TABS) tablet 100 mg        100 mg Oral Every 12 hours 03/02/21 1954 03/07/21 2159   03/02/21 2200  cefdinir (OMNICEF) capsule 300 mg  Status:  Discontinued        300 mg Oral Every 12 hours 03/02/21 1954 03/03/21 1014      Subjective: Without complaints  Objective: Vitals:   03/07/21 0306 03/07/21 0327 03/07/21 0402 03/07/21 0808  BP: (!) 153/91     Pulse: 81     Resp: 20     Temp: 98.4 F (36.9 C)     TempSrc: Oral     SpO2: 99%  95% 95%  Weight:  82.8 kg    Height:        Intake/Output Summary (Last 24 hours) at 03/07/2021 1237 Last data filed at 03/07/2021 0318 Gross per 24 hour  Intake --  Output 1400 ml  Net -1400 ml   Filed Weights   03/04/21 2055 03/06/21 0500 03/07/21 0327  Weight: 86.1 kg 85.2 kg 82.8 kg    Examination: General exam: Awake, laying in bed, in nad Respiratory system: Normal respiratory effort, no wheezing Cardiovascular system: regular rate, s1, s2 Gastrointestinal system: Soft, nondistended, positive BS Central nervous system: CN2-12 grossly intact, strength intact Extremities: Perfused, no clubbing Skin: Normal skin turgor, no notable skin lesions seen Psychiatry: Mood normal // no visual hallucinations   Data Reviewed: I have personally reviewed following labs and imaging studies  CBC: Recent Labs  Lab  03/02/21 1530 03/03/21 0426 03/05/21 0538  WBC 4.5 3.3* 4.0  NEUTROABS 2.1  --  0.8*  HGB 14.3 12.2* 13.4  HCT 43.6 36.8* 40.8  MCV 95.0 96.1 95.3  PLT 291 263 261   Basic Metabolic Panel: Recent Labs  Lab 03/02/21 1530 03/03/21 0426 03/05/21 0538 03/06/21 0938  NA 136 140 136 138  K 3.7 3.8 3.6 4.1  CL 100 105 104 106  CO2 26 25 23 24   GLUCOSE 81 95 84 132*  BUN 14 14 12 12   CREATININE 1.11 1.07 1.06 1.02  CALCIUM 8.8* 8.7* 8.6* 8.8*  MG  --  1.9 1.8 1.8  PHOS  --   --  4.1 3.8   GFR: Estimated Creatinine Clearance: 84.7 mL/min (by C-G formula based on SCr of 1.02 mg/dL). Liver Function Tests: Recent Labs  Lab 03/02/21 1530 03/05/21 0538 03/06/21 0938  AST 19  --   --   ALT 11  --   --   ALKPHOS 78  --   --   BILITOT 1.0  --   --   PROT 7.3  --   --   ALBUMIN 3.6 3.3* 3.3*   No results for input(s): LIPASE, AMYLASE  in the last 168 hours. No results for input(s): AMMONIA in the last 168 hours. Coagulation Profile: No results for input(s): INR, PROTIME in the last 168 hours. Cardiac Enzymes: No results for input(s): CKTOTAL, CKMB, CKMBINDEX, TROPONINI in the last 168 hours. BNP (last 3 results) No results for input(s): PROBNP in the last 8760 hours. HbA1C: No results for input(s): HGBA1C in the last 72 hours. CBG: No results for input(s): GLUCAP in the last 168 hours. Lipid Profile: No results for input(s): CHOL, HDL, LDLCALC, TRIG, CHOLHDL, LDLDIRECT in the last 72 hours. Thyroid Function Tests: No results for input(s): TSH, T4TOTAL, FREET4, T3FREE, THYROIDAB in the last 72 hours. Anemia Panel: No results for input(s): VITAMINB12, FOLATE, FERRITIN, TIBC, IRON, RETICCTPCT in the last 72 hours. Sepsis Labs: Recent Labs  Lab 03/03/21 0522 03/05/21 0538  PROCALCITON <0.10 <0.10    Recent Results (from the past 240 hour(s))  Resp Panel by RT-PCR (Flu A&B, Covid) Nasopharyngeal Swab     Status: None   Collection Time: 03/02/21  6:30 PM   Specimen:  Nasopharyngeal Swab; Nasopharyngeal(NP) swabs in vial transport medium  Result Value Ref Range Status   SARS Coronavirus 2 by RT PCR NEGATIVE NEGATIVE Final    Comment: (NOTE) SARS-CoV-2 target nucleic acids are NOT DETECTED.  The SARS-CoV-2 RNA is generally detectable in upper respiratory specimens during the acute phase of infection. The lowest concentration of SARS-CoV-2 viral copies this assay can detect is 138 copies/mL. A negative result does not preclude SARS-Cov-2 infection and should not be used as the sole basis for treatment or other patient management decisions. A negative result may occur with  improper specimen collection/handling, submission of specimen other than nasopharyngeal swab, presence of viral mutation(s) within the areas targeted by this assay, and inadequate number of viral copies(<138 copies/mL). A negative result must be combined with clinical observations, patient history, and epidemiological information. The expected result is Negative.  Fact Sheet for Patients:  BloggerCourse.com  Fact Sheet for Healthcare Providers:  SeriousBroker.it  This test is no t yet approved or cleared by the [place] FDA and  has been authorized for detection and/or diagnosis of SARS-CoV-2 by FDA under an Emergency Use Authorization (EUA). This EUA will remain  in effect (meaning this test can be used) for the duration of the COVID-19 declaration under Section 564(b)(1) of the Act, 21 U.S.C.section 360bbb-3(b)(1), unless the authorization is terminated  or revoked sooner.       Influenza A by PCR NEGATIVE NEGATIVE Final   Influenza B by PCR NEGATIVE NEGATIVE Final    Comment: (NOTE) The Xpert Xpress SARS-CoV-2/FLU/RSV plus assay is intended as an aid in the diagnosis of influenza from Nasopharyngeal swab specimens and should not be used as a sole basis for treatment. Nasal washings and aspirates are unacceptable for  Xpert Xpress SARS-CoV-2/FLU/RSV testing.  Fact Sheet for Patients: BloggerCourse.com  Fact Sheet for Healthcare Providers: SeriousBroker.it  This test is not yet approved or cleared by the [place] FDA and has been authorized for detection and/or diagnosis of SARS-CoV-2 by FDA under an Emergency Use Authorization (EUA). This EUA will remain in effect (meaning this test can be used) for the duration of the COVID-19 declaration under Section 564(b)(1) of the Act, 21 U.S.C. section 360bbb-3(b)(1), unless the authorization is terminated or revoked.  Performed at [facility], [address], [place], [name] [address]      Radiology Studies: No results found.  Scheduled Meds: . aspirin  81 mg Oral Daily  .  atorvastatin  40 mg Oral Daily  . budesonide (PULMICORT) nebulizer solution  0.5 mg Nebulization BID  . carvedilol  3.125 mg Oral BID WC  . divalproex  500 mg Oral BID  . doxycycline  100 mg Oral Q12H  . enoxaparin (LOVENOX) injection  40 mg Subcutaneous Q24H  . furosemide  40 mg Oral Daily  . gabapentin  600 mg Oral TID  . guaiFENesin  600 mg Oral BID  . hydrALAZINE  25 mg Oral Q8H  . hydrocerin   Topical BID  . ipratropium-albuterol  3 mL Nebulization Q4H  . isosorbide mononitrate  15 mg Oral Daily  . levETIRAcetam  500 mg Oral BID  . nicotine  21 mg Transdermal Daily  . potassium chloride  20 mEq Oral Daily  . sacubitril-valsartan  1 tablet Oral BID  . spironolactone  12.5 mg Oral Daily  . traZODone  50 mg Oral QHS   Continuous Infusions:   LOS: 4 days   Rickey Barbara, MD [facility] Pager On Amion  If 7PM-7AM, please contact night-coverage 03/07/2021, 12:37 PM

## 2021-03-07 NOTE — Progress Notes (Signed)
Physical Therapy Treatment Patient Details Name: [name] MRN: [id number] DOB: [birth year] Today's Date: 03/07/2021    History of Present Illness Patient is a 55 year old male with PMH chronic combined systolic and diastolic CHF EF <76%, cerebellar CVA, CAD, bipolar disorder, COPD, HTN, HLD, bilateral first toe ulcer/osteomyelitis s/p bilateral first toe amputation 01/02/21, substance use disorder, and homelessness who presents to the ED for evaluation of worsening lower extremity swelling and shortness of breath.    PT Comments    Assisted pt off bed pan (BM) RN also in room.  Assisted OOB to [facility]. General transfer comment: assisted from elevated bed to [facility] then from [facility] to amb + 2 side by side assist.  HHA for spastic L UE to maintain control.   Pt declined to sit in recliner.  RN helped me amb pt.  General Gait Details: pt was able to amb with difficulty from [facility] to bed a total of 4 feet (parked BSC across room) very unsteady gait with spastic, Gross motor mvmts L LE with steppage.  Required + 2 side by side assist L>R.    Assisted back to bed.     Follow Up Recommendations  CIR;SNF     Equipment Recommendations  None recommended by PT    Recommendations for Other Services       Precautions / Restrictions Precautions Precautions: Fall Precaution Comments: s/p CVA L hemi...Marland KitchenMarland Kitchenexprssive aphagia Other Brace: Supposed to have post op shoes per previous notes, but does not have them with him    Mobility  Bed Mobility Overal bed mobility: Needs Assistance Bed Mobility: Supine to Sit;Sit to Supine     Supine to sit: Mod assist;+2 for safety/equipment Sit to supine: Mod assist;+2 for safety/equipment;+2 for physical assistance;HOB elevated   General bed mobility comments: pt required increased time and present with severe R lean as well as decreased R side functional use present with tone.  uses L  bed rail to stabelize self. Present with L side gross motor control deficit.  (L  Hemi)    Transfers Overall transfer level: Needs assistance Equipment used: None Transfers: Stand Pivot Transfers;Sit to/from Stand Sit to Stand: Mod assist;Max assist Stand pivot transfers: Mod assist;Max assist       General transfer comment: assisted from elevated bed to [facility] then from [facility] to amb + 2 side by side assist.  HHA for spastic L UE to maintain control.  Ambulation/Gait Ambulation/Gait assistance: Max assist;+2 physical assistance;+2 safety/equipment Gait Distance (Feet): 4 Feet Assistive device: Rolling walker (2 wheeled) Gait Pattern/deviations: Step-to pattern;Decreased stance time - right Gait velocity: decreased   General Gait Details: pt was able to amb with difficulty from [facility] to bed a total of 4 feet (parked BSC across room) very unsteady gait with spastic, Gross motor mvmts L LE with steppage.  Required + 2 side by side assist L>R.   Stairs             Wheelchair Mobility    Modified Rankin (Stroke Patients Only)       Balance                                            Cognition Arousal/Alertness: Awake/alert Behavior During Therapy: WFL for tasks assessed/performed Overall Cognitive Status: Within Functional Limits for tasks assessed  General Comments: AxO x 3 recalled my name from 2 days ago.  Follows all commands.  He can get frustrated with his speech but easily redirected.      Exercises      General Comments        Pertinent Vitals/Pain Pain Assessment: Faces Faces Pain Scale: Hurts a little bit Pain Location: B feet Pain Descriptors / Indicators: Grimacing;Guarding    Home Living                      Prior Function            PT Goals (current goals can now be found in the care plan section) Progress towards PT goals: Progressing toward goals    Frequency    Min 3X/week      PT Plan Current plan remains appropriate    Co-evaluation               AM-PAC PT "6 Clicks" Mobility   Outcome Measure  Help needed turning from your back to your side while in a flat bed without using bedrails?: A Little Help needed moving from lying on your back to sitting on the side of a flat bed without using bedrails?: A Little Help needed moving to and from a bed to a chair (including a wheelchair)?: A Lot Help needed standing up from a chair using your arms (e.g., wheelchair or bedside chair)?: A Lot Help needed to walk in hospital room?: A Lot Help needed climbing 3-5 steps with a railing? : Total 6 Click Score: 13    End of Session Equipment Utilized During Treatment: Gait belt Activity Tolerance: Patient tolerated treatment well Patient left: in bed;with bed alarm set;with call bell/phone within reach Nurse Communication: Mobility status (RN in room at time) PT Visit Diagnosis: Other abnormalities of gait and mobility (R26.89);Other symptoms and signs involving the nervous system (R29.898);Difficulty in walking, not elsewhere classified (R26.2)     Time: 4765-4650 PT Time Calculation (min) (ACUTE ONLY): 34 min  Charges:  $Gait Training: 8-22 mins $Therapeutic Activity: 8-22 mins                     Felecia Shelling  PTA Acute  Rehabilitation Services Pager      [phone] Office      [phone]

## 2021-03-08 MED ORDER — CAMPHOR-MENTHOL 0.5-0.5 % EX LOTN
TOPICAL_LOTION | CUTANEOUS | Status: DC | PRN
  Filled 2021-03-08: qty 222

## 2021-03-08 MED ORDER — BACITRACIN 500 UNIT/GM EX OINT
TOPICAL_OINTMENT | Freq: Two times a day (BID) | CUTANEOUS | Status: DC
  Filled 2021-03-08: qty 14
  Filled 2021-03-08: qty 28.35

## 2021-03-08 NOTE — Progress Notes (Signed)
Pt would not let me listen to his breath sounds

## 2021-03-08 NOTE — Progress Notes (Signed)
PROGRESS NOTE    [name]  VFI:[id number] DOB: [birth year] DOA: 03/02/2021 PCP: Oneita Hurt, No    Brief Narrative:  55 year old M with PMH of combined CHF, cerebellar CVA with dysarthria, CAD, COPD, HTN, HLD, bipolar disorder, aggressive behavior, neuropathy, cocaine use, tobacco use, recent hospitalization from 1/17-3/6 for cerebellar CVA, CHF exacerbation and bilateral great toe osteomyelitis presenting with shortness of breath, orthopnea, edema and lower extremity pain, and admitted for acute on chronic combined CHF and possible RML pneumonia.  Patient has had 5 ED visits including this since his discharged on 3/6.  In ED, slightly tachycardic and hypertensive.  Normal saturation.  BNP, troponin and D-dimer slightly elevated. CTA and LE [place] negative for VTE.  Started on IV Lasix and antibiotics.  Pro-Cal negative.  Cefdinir discontinued given low suspicion for pneumonia.  Continued azithromycin and nebulizers for COPD.  Transitioned to p.o. Lasix.  Assessment & Plan:   Principal Problem:   Acute on chronic combined systolic (congestive) and diastolic (congestive) heart failure (HCC) Active Problems:   Schizoaffective disorder, bipolar type (HCC)   Cocaine abuse with cocaine-induced mood disorder (HCC)   Hyperlipidemia   CAD (coronary artery disease)   HTN (hypertension)   History of stroke   Substance use disorder  Acute on chronic combined systolic and diastolic CHF: TTE on 1/18 with LVEF of 20%, GH, G2-DD, moderately reduced RVSF, moderate LAE and RAE.  Presents with SOB, orthopnea, DOE and edema.  BNP elevated but less than prior values. CTA and LE [place] negative for DVT.  Still with ongoing cocaine and tobacco use.  Had about 2.2 L UOP/24 hours.  Creatinine is stable. -Pt is continued on home Lasix 40 mg daily -Closely monitor fluid status, renal functions and electrolytes -Continue Coreg, Entresto, Aldactone and hydralazine.  Changed Isordil to Imdur.  -Continue to recommend against  cocaine and nicotine.  RML pneumonia: Ruled out.  Procalcitonin negative.  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  No evidence of infection apart from poor hygiene.  Elevated troponin/CAD: Elevated troponin likely demand ischemia.  No chest pain. -Continue cardiac meds, aspirin and a statin as above  Elevated D-dimer: CTA and LE [place] negative for VTE.  Hypertension: SBP slightly elevated. -Cardiac meds and diuretics as above  Bipolar disorder/aggressive behavior/restlessness: Improved.  -conversing appropriately today -Appreciate input by psych             -Deemed to have capacity per discussion with psych.             -continue home Depakote             -Continue with saphris as needed for agitation  History of suspected seizure activity: -Continue Keppra.  Hyperlipidemia: -Continue atorvastatin.  Substance use disorder/tobacco use disorder/cocaine use-admitted use of cocaine and tobacco -Encouraged to stop using cocaine and smoking cigarettes -Continue nicotine patch  Bilateral lower extremity pain/neuropathy -Continue home gabapentin -Robaxin as needed for possible muscle spasm  Homelessness/generalized weakness/debility-reportedly uses wheelchair.  Per discharge summary on 3/6, patient was able to ambulate from his room to nursing station prior to discharge. -Therapy recommended CIR but not a candidate -TOC following for possible SNF  placement.  Leukopenia/neutropenia: Improved. -Recheck in the  morning  Overweight Body mass index is 28.03 kg/m.  DVT prophylaxis: Lovenox subq Code Status: Full Family Communication: Pt in room, family not at bedside  Status is: Inpatient  Remains inpatient appropriate because:Unsafe d/c plan   Dispo: The patient is from: Home              Anticipated d/c is to: SNF              Patient currently is not medically stable to d/c.   Difficult to place patient No  Consultants:   Psychiatry  Procedures:     Antimicrobials: Anti-infectives (From admission, onward)   Start     Dose/Rate Route Frequency Ordered Stop   03/02/21 2200  doxycycline (VIBRA-TABS) tablet 100 mg        100 mg Oral Every 12 hours 03/02/21 1954 03/07/21 2159   03/02/21 2200  cefdinir (OMNICEF) capsule 300 mg  Status:  Discontinued        300 mg Oral Every 12 hours 03/02/21 1954 03/03/21 1014      Subjective: No acute issues  Objective: Vitals:   03/08/21 0300 03/08/21 0725 03/08/21 0729 03/08/21 1415  BP:    106/67  Pulse:    84  Resp:    20  Temp:      TempSrc:      SpO2:  100% 100% 95%  Weight: 83.1 kg     Height:        Intake/Output Summary (Last 24 hours) at 03/08/2021 1713 Last data filed at 03/08/2021 1433 Gross per 24 hour  Intake 500 ml  Output 2150 ml  Net -1650 ml   Filed Weights   03/06/21 0500 03/07/21 0327 03/08/21 0300  Weight: 85.2 kg 82.8 kg 83.1 kg    Examination: General exam: Awake, in NAD Respiratory system: Normal respiratory effort, conversing in full sentences Cardiovascular system: perfused Gastrointestinal system: nondistended Central nervous system:No seizures or tremors Extremities: No clubbing Skin: aeas of dry skin Psychiatry: Mood normal // no visual hallucinations   Data Reviewed: I have personally reviewed following labs and imaging studies  CBC: Recent Labs  Lab 03/02/21 1530 03/03/21 0426 03/05/21 0538  WBC 4.5 3.3* 4.0  NEUTROABS 2.1  --  0.8*  HGB 14.3 12.2* 13.4  HCT 43.6 36.8* 40.8  MCV  95.0 96.1 95.3  PLT 291 263 261   Basic Metabolic Panel: Recent Labs  Lab 03/02/21 1530 03/03/21 0426 03/05/21 0538 03/06/21 0938  NA 136 140 136 138  K 3.7 3.8 3.6 4.1  CL 100 105 104 106  CO2 26 25 23 24   GLUCOSE 81 95 84 132*  BUN 14 14 12 12   CREATININE 1.11 1.07 1.06 1.02  CALCIUM 8.8* 8.7* 8.6* 8.8*  MG  --  1.9 1.8 1.8  PHOS  --   --  4.1 3.8   GFR: Estimated Creatinine Clearance: 84.7 mL/min (by C-G formula based on SCr of 1.02 mg/dL). Liver Function Tests: Recent Labs  Lab 03/02/21 1530 03/05/21 0538 03/06/21 0938  AST 19  --   --   ALT 11  --   --   ALKPHOS 78  --   --   BILITOT 1.0  --   --   PROT 7.3  --   --   ALBUMIN 3.6 3.3* 3.3*   No results for input(s): LIPASE, AMYLASE in the last 168 hours. No results for input(s): AMMONIA in the last 168 hours. Coagulation Profile:  No results for input(s): INR, PROTIME in the last 168 hours. Cardiac Enzymes: No results for input(s): CKTOTAL, CKMB, CKMBINDEX, TROPONINI in the last 168 hours. BNP (last 3 results) No results for input(s): PROBNP in the last 8760 hours. HbA1C: No results for input(s): HGBA1C in the last 72 hours. CBG: No results for input(s): GLUCAP in the last 168 hours. Lipid Profile: No results for input(s): CHOL, HDL, LDLCALC, TRIG, CHOLHDL, LDLDIRECT in the last 72 hours. Thyroid Function Tests: No results for input(s): TSH, T4TOTAL, FREET4, T3FREE, THYROIDAB in the last 72 hours. Anemia Panel: No results for input(s): VITAMINB12, FOLATE, FERRITIN, TIBC, IRON, RETICCTPCT in the last 72 hours. Sepsis Labs: Recent Labs  Lab 03/03/21 0522 03/05/21 0538  PROCALCITON <0.10 <0.10    Recent Results (from the past 240 hour(s))  Resp Panel by RT-PCR (Flu A&B, Covid) Nasopharyngeal Swab     Status: None   Collection Time: 03/02/21  6:30 PM   Specimen: Nasopharyngeal Swab; Nasopharyngeal(NP) swabs in vial transport medium  Result Value Ref Range Status   SARS Coronavirus 2 by RT PCR  NEGATIVE NEGATIVE Final    Comment: (NOTE) SARS-CoV-2 target nucleic acids are NOT DETECTED.  The SARS-CoV-2 RNA is generally detectable in upper respiratory specimens during the acute phase of infection. The lowest concentration of SARS-CoV-2 viral copies this assay can detect is 138 copies/mL. A negative result does not preclude SARS-Cov-2 infection and should not be used as the sole basis for treatment or other patient management decisions. A negative result may occur with  improper specimen collection/handling, submission of specimen other than nasopharyngeal swab, presence of viral mutation(s) within the areas targeted by this assay, and inadequate number of viral copies(<138 copies/mL). A negative result must be combined with clinical observations, patient history, and epidemiological information. The expected result is Negative.  Fact Sheet for Patients:  BloggerCourse.com  Fact Sheet for Healthcare Providers:  SeriousBroker.it  This test is no t yet approved or cleared by the [place] FDA and  has been authorized for detection and/or diagnosis of SARS-CoV-2 by FDA under an Emergency Use Authorization (EUA). This EUA will remain  in effect (meaning this test can be used) for the duration of the COVID-19 declaration under Section 564(b)(1) of the Act, 21 U.S.C.section 360bbb-3(b)(1), unless the authorization is terminated  or revoked sooner.       Influenza A by PCR NEGATIVE NEGATIVE Final   Influenza B by PCR NEGATIVE NEGATIVE Final    Comment: (NOTE) The Xpert Xpress SARS-CoV-2/FLU/RSV plus assay is intended as an aid in the diagnosis of influenza from Nasopharyngeal swab specimens and should not be used as a sole basis for treatment. Nasal washings and aspirates are unacceptable for Xpert Xpress SARS-CoV-2/FLU/RSV testing.  Fact Sheet for Patients: BloggerCourse.com  Fact Sheet for  Healthcare Providers: SeriousBroker.it  This test is not yet approved or cleared by the [place] FDA and has been authorized for detection and/or diagnosis of SARS-CoV-2 by FDA under an Emergency Use Authorization (EUA). This EUA will remain in effect (meaning this test can be used) for the duration of the COVID-19 declaration under Section 564(b)(1) of the Act, 21 U.S.C. section 360bbb-3(b)(1), unless the authorization is terminated or revoked.  Performed at [facility], [address] [place], [name] [address]      Radiology Studies: No results found.  Scheduled Meds: . aspirin  81 mg Oral Daily  . atorvastatin  40 mg Oral Daily  . budesonide (PULMICORT) nebulizer solution  0.5 mg  Nebulization BID  . carvedilol  3.125 mg Oral BID WC  . divalproex  500 mg Oral BID  . enoxaparin (LOVENOX) injection  40 mg Subcutaneous Q24H  . furosemide  40 mg Oral Daily  . gabapentin  600 mg Oral TID  . guaiFENesin  600 mg Oral BID  . hydrALAZINE  25 mg Oral Q8H  . hydrocerin   Topical BID  . ipratropium-albuterol  3 mL Nebulization Q4H  . isosorbide mononitrate  15 mg Oral Daily  . levETIRAcetam  500 mg Oral BID  . nicotine  21 mg Transdermal Daily  . potassium chloride  20 mEq Oral Daily  . sacubitril-valsartan  1 tablet Oral BID  . spironolactone  12.5 mg Oral Daily  . traZODone  50 mg Oral QHS   Continuous Infusions:   LOS: 5 days   Rickey Barbara, MD [facility] Pager On Amion  If 7PM-7AM, please contact night-coverage 03/08/2021, 5:13 PM

## 2021-03-08 NOTE — TOC Progression Note (Signed)
Transition of Care [facility]) - Progression Note    Patient Details  Name: [name] MRN: [id number] Date of Birth: July 21, 1966  Transition of Care [facility]) CM/SW Contact  Darleene Cleaver, [name] Phone Number: 03/08/2021, 5:17 PM  Clinical Narrative:     CSW spoke to patient and assisted with PASSAR level 2 screen.  Patient was pleasant, but difficult to understand due to slurred speech.  Patient is still agreeable to SNF if a bed can be found.  CSW to continue to work on finding a bed for patient.   Expected Discharge Plan: Skilled Nursing Facility Barriers to Discharge: Continued Medical Work up,Awaiting State Approval (PASRR)  Expected Discharge Plan and Services Expected Discharge Plan: Skilled Nursing Facility In-house Referral: Clinical Social Work   Post Acute Care Choice: Skilled Nursing Facility Living arrangements for the past 2 months: Homeless                                       Social Determinants of Health (SDOH) Interventions    Readmission Risk Interventions Readmission Risk Prevention Plan 03/05/2021 01/07/2021 12/10/2020  Transportation Screening Complete Complete Complete  PCP or Specialist Appt within 3-5 Days - Complete -  HRI or Home Care Consult - Complete -  Social Work Consult for Recovery Care Planning/Counseling - Complete -  Palliative Care Screening - Complete -  Medication Review [employer]) Complete Complete Referral to Pharmacy  PCP or Specialist appointment within 3-5 days of discharge Complete - Complete  HRI or Home Care Consult Complete - Complete  SW Recovery Care/Counseling Consult Complete - Complete  Palliative Care Screening Not Applicable - Not Applicable  Skilled Nursing Facility Complete - Not Applicable  Some recent data might be hidden

## 2021-03-09 MED ORDER — BENZONATATE 100 MG PO CAPS
100.0000 mg | ORAL_CAPSULE | Freq: Three times a day (TID) | ORAL | Status: DC | PRN
  Administered 2021-03-09 – 2021-03-12 (×7): 100 mg via ORAL
  Filled 2021-03-09 (×7): qty 1

## 2021-03-09 NOTE — Progress Notes (Signed)
PROGRESS NOTE    [name]  VFI:[id number] DOB: [birth year] DOA: 03/02/2021 PCP: Oneita Hurt, No    Brief Narrative:  55 year old M with PMH of combined CHF, cerebellar CVA with dysarthria, CAD, COPD, HTN, HLD, bipolar disorder, aggressive behavior, neuropathy, cocaine use, tobacco use, recent hospitalization from 1/17-3/6 for cerebellar CVA, CHF exacerbation and bilateral great toe osteomyelitis presenting with shortness of breath, orthopnea, edema and lower extremity pain, and admitted for acute on chronic combined CHF and possible RML pneumonia.  Patient has had 5 ED visits including this since his discharged on 3/6.  In ED, slightly tachycardic and hypertensive.  Normal saturation.  BNP, troponin and D-dimer slightly elevated. CTA and LE [place] negative for VTE.  Started on IV Lasix and antibiotics.  Pro-Cal negative.  Cefdinir discontinued given low suspicion for pneumonia.  Continued azithromycin and nebulizers for COPD.  Transitioned to p.o. Lasix.  Assessment & Plan:   Principal Problem:   Acute on chronic combined systolic (congestive) and diastolic (congestive) heart failure (HCC) Active Problems:   Schizoaffective disorder, bipolar type (HCC)   Cocaine abuse with cocaine-induced mood disorder (HCC)   Hyperlipidemia   CAD (coronary artery disease)   HTN (hypertension)   History of stroke   Substance use disorder  Acute on chronic combined systolic and diastolic CHF: TTE on 1/18 with LVEF of 20%, GH, G2-DD, moderately reduced RVSF, moderate LAE and RAE.  Presents with SOB, orthopnea, DOE and edema.  BNP elevated but less than prior values. CTA and LE [place] negative for DVT.  Still with ongoing cocaine and tobacco use.  Had about 2.2 L UOP/24 hours.  Creatinine is stable. -Pt is continued on home Lasix 40 mg daily -Closely monitor fluid status, renal functions and electrolytes -Continue Coreg, Entresto, Aldactone and hydralazine.  Changed Isordil to Imdur.  -Continue to recommend against  cocaine and nicotine.  RML pneumonia: Ruled out.  Procalcitonin negative.  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  No evidence of infection apart from poor hygiene.  Elevated troponin/CAD: Elevated troponin likely demand ischemia.  No chest pain. -Continue cardiac meds, aspirin and a statin as above  Elevated D-dimer: CTA and LE [place] negative for VTE.  Hypertension: SBP slightly elevated. -Cardiac meds and diuretics as above  Bipolar disorder/aggressive behavior/restlessness: Improved.  -conversing appropriately today -Appreciate input by psych             -Deemed to have capacity per discussion with psych.             -continue home Depakote             -Continue with saphris as needed for agitation  History of suspected seizure activity: -Continue Keppra.  Hyperlipidemia: -Continue atorvastatin.  Substance use disorder/tobacco use disorder/cocaine use-admitted use of cocaine and tobacco -Encouraged to stop using cocaine and smoking cigarettes -Continue nicotine patch  Bilateral lower extremity pain/neuropathy -Continue home gabapentin -Robaxin as needed for possible muscle spasm  Homelessness/generalized weakness/debility-reportedly uses wheelchair.  Per discharge summary on 3/6, patient was able to ambulate from his room to nursing station prior to discharge. -Therapy recommended CIR but not a candidate -TOC following for possible SNF  placement.  Leukopenia/neutropenia: Improved. -Recheck in the  morning  Overweight Body mass index is 28.03 kg/m.  DVT prophylaxis: Lovenox subq Code Status: Full Family Communication: Pt in room, family not at bedside  Status is: Inpatient  Remains inpatient appropriate because:Unsafe d/c plan   Dispo: The patient is from: Home              Anticipated d/c is to: SNF              Patient currently is not medically stable to d/c.   Difficult to place patient No  Consultants:   Psychiatry  Procedures:     Antimicrobials: Anti-infectives (From admission, onward)   Start     Dose/Rate Route Frequency Ordered Stop   03/02/21 2200  doxycycline (VIBRA-TABS) tablet 100 mg        100 mg Oral Every 12 hours 03/02/21 1954 03/07/21 2159   03/02/21 2200  cefdinir (OMNICEF) capsule 300 mg  Status:  Discontinued        300 mg Oral Every 12 hours 03/02/21 1954 03/03/21 1014      Subjective: Complaining of itching in the groin region  Objective: Vitals:   03/09/21 0307 03/09/21 0327 03/09/21 0439 03/09/21 1420  BP: 135/90   106/68  Pulse: 86   85  Resp: 20     Temp:      TempSrc:      SpO2: 100% 98%  98%  Weight:   80.8 kg   Height:        Intake/Output Summary (Last 24 hours) at 03/09/2021 1437 Last data filed at 03/09/2021 0358 Gross per 24 hour  Intake --  Output 1250 ml  Net -1250 ml   Filed Weights   03/07/21 0327 03/08/21 0300 03/09/21 0439  Weight: 82.8 kg 83.1 kg 80.8 kg    Examination: General exam: Conversant, in no acute distress Respiratory system: normal chest rise, clear, no audible wheezing Cardiovascular system: regular rhythm, s1-s2 Gastrointestinal system: Nondistended, nontender, pos BS Central nervous system: No seizures, no tremors Extremities: No cyanosis, no joint deformities Skin: No rashes, no pallor Psychiatry: Affect normal // no auditory hallucinations   Data Reviewed: I have personally reviewed following labs and imaging studies  CBC: Recent Labs  Lab 03/02/21 1530 03/03/21 0426  03/05/21 0538  WBC 4.5 3.3* 4.0  NEUTROABS 2.1  --  0.8*  HGB 14.3 12.2* 13.4  HCT 43.6 36.8* 40.8  MCV 95.0 96.1 95.3  PLT 291 263 261   Basic Metabolic Panel: Recent Labs  Lab 03/02/21 1530 03/03/21 0426 03/05/21 0538 03/06/21 0938  NA 136 140 136 138  K 3.7 3.8 3.6 4.1  CL 100 105 104 106  CO2 26 25 23 24   GLUCOSE 81 95 84 132*  BUN 14 14 12 12   CREATININE 1.11 1.07 1.06 1.02  CALCIUM 8.8* 8.7* 8.6* 8.8*  MG  --  1.9 1.8 1.8  PHOS  --   --  4.1 3.8   GFR: Estimated Creatinine Clearance: 84.7 mL/min (by C-G formula based on SCr of 1.02 mg/dL). Liver Function Tests: Recent Labs  Lab 03/02/21 1530 03/05/21 0538 03/06/21 0938  AST 19  --   --   ALT 11  --   --   ALKPHOS 78  --   --   BILITOT 1.0  --   --   PROT 7.3  --   --   ALBUMIN 3.6 3.3* 3.3*   No results for input(s): LIPASE, AMYLASE in the last  168 hours. No results for input(s): AMMONIA in the last 168 hours. Coagulation Profile: No results for input(s): INR, PROTIME in the last 168 hours. Cardiac Enzymes: No results for input(s): CKTOTAL, CKMB, CKMBINDEX, TROPONINI in the last 168 hours. BNP (last 3 results) No results for input(s): PROBNP in the last 8760 hours. HbA1C: No results for input(s): HGBA1C in the last 72 hours. CBG: No results for input(s): GLUCAP in the last 168 hours. Lipid Profile: No results for input(s): CHOL, HDL, LDLCALC, TRIG, CHOLHDL, LDLDIRECT in the last 72 hours. Thyroid Function Tests: No results for input(s): TSH, T4TOTAL, FREET4, T3FREE, THYROIDAB in the last 72 hours. Anemia Panel: No results for input(s): VITAMINB12, FOLATE, FERRITIN, TIBC, IRON, RETICCTPCT in the last 72 hours. Sepsis Labs: Recent Labs  Lab 03/03/21 0522 03/05/21 0538  PROCALCITON <0.10 <0.10    Recent Results (from the past 240 hour(s))  Resp Panel by RT-PCR (Flu A&B, Covid) Nasopharyngeal Swab     Status: None   Collection Time: 03/02/21  6:30 PM   Specimen: Nasopharyngeal Swab;  Nasopharyngeal(NP) swabs in vial transport medium  Result Value Ref Range Status   SARS Coronavirus 2 by RT PCR NEGATIVE NEGATIVE Final    Comment: (NOTE) SARS-CoV-2 target nucleic acids are NOT DETECTED.  The SARS-CoV-2 RNA is generally detectable in upper respiratory specimens during the acute phase of infection. The lowest concentration of SARS-CoV-2 viral copies this assay can detect is 138 copies/mL. A negative result does not preclude SARS-Cov-2 infection and should not be used as the sole basis for treatment or other patient management decisions. A negative result may occur with  improper specimen collection/handling, submission of specimen other than nasopharyngeal swab, presence of viral mutation(s) within the areas targeted by this assay, and inadequate number of viral copies(<138 copies/mL). A negative result must be combined with clinical observations, patient history, and epidemiological information. The expected result is Negative.  Fact Sheet for Patients:  BloggerCourse.com  Fact Sheet for Healthcare Providers:  SeriousBroker.it  This test is no t yet approved or cleared by the [place] FDA and  has been authorized for detection and/or diagnosis of SARS-CoV-2 by FDA under an Emergency Use Authorization (EUA). This EUA will remain  in effect (meaning this test can be used) for the duration of the COVID-19 declaration under Section 564(b)(1) of the Act, 21 U.S.C.section 360bbb-3(b)(1), unless the authorization is terminated  or revoked sooner.       Influenza A by PCR NEGATIVE NEGATIVE Final   Influenza B by PCR NEGATIVE NEGATIVE Final    Comment: (NOTE) The Xpert Xpress SARS-CoV-2/FLU/RSV plus assay is intended as an aid in the diagnosis of influenza from Nasopharyngeal swab specimens and should not be used as a sole basis for treatment. Nasal washings and aspirates are unacceptable for Xpert Xpress  SARS-CoV-2/FLU/RSV testing.  Fact Sheet for Patients: BloggerCourse.com  Fact Sheet for Healthcare Providers: SeriousBroker.it  This test is not yet approved or cleared by the [place] FDA and has been authorized for detection and/or diagnosis of SARS-CoV-2 by FDA under an Emergency Use Authorization (EUA). This EUA will remain in effect (meaning this test can be used) for the duration of the COVID-19 declaration under Section 564(b)(1) of the Act, 21 U.S.C. section 360bbb-3(b)(1), unless the authorization is terminated or revoked.  Performed at [facility], [address], [place], [name] [address]      Radiology Studies: No results found.  Scheduled Meds: . aspirin  81 mg Oral Daily  . atorvastatin  40 mg Oral Daily  . bacitracin   Topical BID  . budesonide (PULMICORT) nebulizer solution  0.5 mg Nebulization BID  . carvedilol  3.125 mg Oral BID WC  . divalproex  500 mg Oral BID  . enoxaparin (LOVENOX) injection  40 mg Subcutaneous Q24H  . furosemide  40 mg Oral Daily  . gabapentin  600 mg Oral TID  . guaiFENesin  600 mg Oral BID  . hydrALAZINE  25 mg Oral Q8H  . hydrocerin   Topical BID  . ipratropium-albuterol  3 mL Nebulization Q4H  . isosorbide mononitrate  15 mg Oral Daily  . levETIRAcetam  500 mg Oral BID  . nicotine  21 mg Transdermal Daily  . potassium chloride  20 mEq Oral Daily  . sacubitril-valsartan  1 tablet Oral BID  . spironolactone  12.5 mg Oral Daily  . traZODone  50 mg Oral QHS   Continuous Infusions:   LOS: 6 days   Rickey Barbara, MD [facility] Pager On Amion  If 7PM-7AM, please contact night-coverage 03/09/2021, 2:37 PM

## 2021-03-10 NOTE — Progress Notes (Signed)
PROGRESS NOTE    [name]  SWH:[id number] DOB: 1966/04/03 DOA: 03/02/2021 PCP: Oneita Hurt, No    Brief Narrative:  55 year old M with PMH of combined CHF, cerebellar CVA with dysarthria, CAD, COPD, HTN, HLD, bipolar disorder, aggressive behavior, neuropathy, cocaine use, tobacco use, recent hospitalization from 1/17-3/6 for cerebellar CVA, CHF exacerbation and bilateral great toe osteomyelitis presenting with shortness of breath, orthopnea, edema and lower extremity pain, and admitted for acute on chronic combined CHF and possible RML pneumonia.  Patient has had 5 ED visits including this since his discharged on 3/6.  In ED, slightly tachycardic and hypertensive.  Normal saturation.  BNP, troponin and D-dimer slightly elevated. CTA and LE [place] negative for VTE.  Started on IV Lasix and antibiotics.  Pro-Cal negative.  Cefdinir discontinued given low suspicion for pneumonia.  Continued azithromycin and nebulizers for COPD.  Transitioned to p.o. Lasix.  Assessment & Plan:   Principal Problem:   Acute on chronic combined systolic (congestive) and diastolic (congestive) heart failure (HCC) Active Problems:   Schizoaffective disorder, bipolar type (HCC)   Cocaine abuse with cocaine-induced mood disorder (HCC)   Hyperlipidemia   CAD (coronary artery disease)   HTN (hypertension)   History of stroke   Substance use disorder  Acute on chronic combined systolic and diastolic CHF: TTE on 1/18 with LVEF of 20%, GH, G2-DD, moderately reduced RVSF, moderate LAE and RAE.  Presents with SOB, orthopnea, DOE and edema.  BNP elevated but less than prior values. CTA and LE [place] negative for DVT.  Still with ongoing cocaine and tobacco use.  Had about 2.2 L UOP/24 hours.  Creatinine is stable. -Pt is continued on home Lasix 40 mg daily -Closely monitor fluid status, renal functions and electrolytes -Continue Coreg, Entresto, Aldactone and hydralazine.  Changed Isordil to Imdur.  -Recommend against cocaine and  nicotine.  RML pneumonia: Ruled out.  Procalcitonin negative.  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  No evidence of infection apart from poor hygiene.  Elevated troponin/CAD: Elevated troponin likely demand ischemia.  No chest pain. -Continue cardiac meds, aspirin and a statin as above  Elevated D-dimer: CTA and LE [place] negative for VTE.  Hypertension: SBP slightly elevated. -Cardiac meds and diuretics as above  Bipolar disorder/aggressive behavior/restlessness: Improved.  -conversing appropriately today -Appreciate input by psych             -Deemed to have capacity per discussion with psych.             -continue home Depakote             -Continue with saphris as needed for agitation  History of suspected seizure activity: -Continue Keppra.  Hyperlipidemia: -Continue atorvastatin.  Substance use disorder/tobacco use disorder/cocaine use-admitted use of cocaine and tobacco -Encouraged to stop using cocaine and smoking cigarettes -Continue nicotine patch  Bilateral lower extremity pain/neuropathy -Continue home gabapentin -Robaxin as needed for possible muscle spasm  Homelessness/generalized weakness/debility-reportedly uses wheelchair.  Per discharge summary on 3/6, patient was able to ambulate from his room to nursing station prior to discharge. -Therapy recommended CIR but not a candidate -TOC following for possible SNF placement.  Leukopenia/neutropenia: Improved. -Recheck in the  morning  Overweight Body mass index is 28.03 kg/m.  DVT prophylaxis: Lovenox subq Code Status: Full Family Communication: Pt in room, family not at bedside  Status is: Inpatient  Remains inpatient appropriate because:Unsafe d/c plan   Dispo: The patient is from: Home              Anticipated d/c is to: SNF              Patient currently is not medically stable to d/c.   Difficult to place patient No  Consultants:   Psychiatry  Procedures:     Antimicrobials: Anti-infectives (From admission, onward)   Start     Dose/Rate Route Frequency Ordered Stop   03/02/21 2200  doxycycline (VIBRA-TABS) tablet 100 mg        100 mg Oral Every 12 hours 03/02/21 1954 03/07/21 2159   03/02/21 2200  cefdinir (OMNICEF) capsule 300 mg  Status:  Discontinued        300 mg Oral Every 12 hours 03/02/21 1954 03/03/21 1014      Subjective: Without complaints  Objective: Vitals:   03/10/21 0429 03/10/21 0500 03/10/21 0845 03/10/21 0846  BP: (!) 158/86  (!) 122/101 109/70  Pulse: 72     Resp:   17 16  Temp: 97.8 F (36.6 C)     TempSrc: Oral     SpO2: 100%     Weight:  84.8 kg    Height:        Intake/Output Summary (Last 24 hours) at 03/10/2021 1623 Last data filed at 03/10/2021 0200 Gross per 24 hour  Intake 240 ml  Output 500 ml  Net -260 ml   Filed Weights   03/08/21 0300 03/09/21 0439 03/10/21 0500  Weight: 83.1 kg 80.8 kg 84.8 kg    Examination: General exam: Awake, laying in bed, in nad Respiratory system: Normal respiratory effort, no wheezing Cardiovascular system: regular rate, s1, s2 Gastrointestinal system: Soft, nondistended, positive BS Central nervous system: CN2-12 grossly intact, strength intact Extremities: Perfused, no clubbing Skin: Normal skin turgor, no notable skin lesions seen Psychiatry: Mood normal // no visual hallucinations   Data Reviewed: I have personally reviewed following labs and imaging studies  CBC: Recent Labs  Lab  03/05/21 0538  WBC 4.0  NEUTROABS 0.8*  HGB 13.4  HCT 40.8  MCV 95.3  PLT 261   Basic Metabolic Panel: Recent Labs  Lab 03/05/21 0538 03/06/21 0938  NA 136 138  K 3.6 4.1  CL 104 106  CO2 23 24  GLUCOSE 84 132*  BUN 12 12  CREATININE 1.06 1.02  CALCIUM 8.6* 8.8*  MG 1.8 1.8  PHOS 4.1 3.8   GFR: Estimated Creatinine Clearance: 84.7 mL/min (by C-G formula based on SCr of 1.02 mg/dL). Liver Function Tests: Recent Labs  Lab 03/05/21 0538 03/06/21 0938  ALBUMIN 3.3* 3.3*   No results for input(s): LIPASE, AMYLASE in the last 168 hours. No results for input(s): AMMONIA in the last 168 hours. Coagulation Profile: No results for input(s): INR, PROTIME in the last 168 hours. Cardiac Enzymes: No results for input(s): CKTOTAL, CKMB, CKMBINDEX, TROPONINI in the last 168 hours. BNP (last 3 results) No results for input(s): PROBNP in the last 8760 hours. HbA1C: No results for input(s): HGBA1C in the last 72 hours. CBG: No results for input(s): GLUCAP in the last 168 hours. Lipid Profile: No results for input(s): CHOL, HDL, LDLCALC, TRIG, CHOLHDL, LDLDIRECT in the last 72 hours. Thyroid  Function Tests: No results for input(s): TSH, T4TOTAL, FREET4, T3FREE, THYROIDAB in the last 72 hours. Anemia Panel: No results for input(s): VITAMINB12, FOLATE, FERRITIN, TIBC, IRON, RETICCTPCT in the last 72 hours. Sepsis Labs: Recent Labs  Lab 03/05/21 0538  PROCALCITON <0.10    Recent Results (from the past 240 hour(s))  Resp Panel by RT-PCR (Flu A&B, Covid) Nasopharyngeal Swab     Status: None   Collection Time: 03/02/21  6:30 PM   Specimen: Nasopharyngeal Swab; Nasopharyngeal(NP) swabs in vial transport medium  Result Value Ref Range Status   SARS Coronavirus 2 by RT PCR NEGATIVE NEGATIVE Final    Comment: (NOTE) SARS-CoV-2 target nucleic acids are NOT DETECTED.  The SARS-CoV-2 RNA is generally detectable in upper respiratory specimens during the acute phase of infection.  The lowest concentration of SARS-CoV-2 viral copies this assay can detect is 138 copies/mL. A negative result does not preclude SARS-Cov-2 infection and should not be used as the sole basis for treatment or other patient management decisions. A negative result may occur with  improper specimen collection/handling, submission of specimen other than nasopharyngeal swab, presence of viral mutation(s) within the areas targeted by this assay, and inadequate number of viral copies(<138 copies/mL). A negative result must be combined with clinical observations, patient history, and epidemiological information. The expected result is Negative.  Fact Sheet for Patients:  BloggerCourse.com  Fact Sheet for Healthcare Providers:  SeriousBroker.it  This test is no t yet approved or cleared by the [place] FDA and  has been authorized for detection and/or diagnosis of SARS-CoV-2 by FDA under an Emergency Use Authorization (EUA). This EUA will remain  in effect (meaning this test can be used) for the duration of the COVID-19 declaration under Section 564(b)(1) of the Act, 21 U.S.C.section 360bbb-3(b)(1), unless the authorization is terminated  or revoked sooner.       Influenza A by PCR NEGATIVE NEGATIVE Final   Influenza B by PCR NEGATIVE NEGATIVE Final    Comment: (NOTE) The Xpert Xpress SARS-CoV-2/FLU/RSV plus assay is intended as an aid in the diagnosis of influenza from Nasopharyngeal swab specimens and should not be used as a sole basis for treatment. Nasal washings and aspirates are unacceptable for Xpert Xpress SARS-CoV-2/FLU/RSV testing.  Fact Sheet for Patients: BloggerCourse.com  Fact Sheet for Healthcare Providers: SeriousBroker.it  This test is not yet approved or cleared by the [place] FDA and has been authorized for detection and/or diagnosis of SARS-CoV-2 by FDA  under an Emergency Use Authorization (EUA). This EUA will remain in effect (meaning this test can be used) for the duration of the COVID-19 declaration under Section 564(b)(1) of the Act, 21 U.S.C. section 360bbb-3(b)(1), unless the authorization is terminated or revoked.  Performed at [facility], [address] [place], [name] [address]      Radiology Studies: No results found.  Scheduled Meds: . aspirin  81 mg Oral Daily  . atorvastatin  40 mg Oral Daily  . bacitracin   Topical BID  . budesonide (PULMICORT) nebulizer solution  0.5 mg Nebulization BID  . carvedilol  3.125 mg Oral BID WC  . divalproex  500 mg Oral BID  . enoxaparin (LOVENOX) injection  40 mg Subcutaneous Q24H  . furosemide  40 mg Oral Daily  . gabapentin  600 mg Oral TID  . guaiFENesin  600 mg Oral BID  . hydrALAZINE  25 mg Oral Q8H  . hydrocerin   Topical BID  . ipratropium-albuterol  3 mL Nebulization Q4H  .  isosorbide mononitrate  15 mg Oral Daily  . levETIRAcetam  500 mg Oral BID  . nicotine  21 mg Transdermal Daily  . potassium chloride  20 mEq Oral Daily  . sacubitril-valsartan  1 tablet Oral BID  . spironolactone  12.5 mg Oral Daily  . traZODone  50 mg Oral QHS   Continuous Infusions:   LOS: 7 days   Rickey Barbara, MD [facility] Pager On Amion  If 7PM-7AM, please contact night-coverage 03/10/2021, 4:23 PM

## 2021-03-10 NOTE — TOC Progression Note (Addendum)
Transition of Care [facility]) - Progression Note    Patient Details  Name: [name] MRN: [id number] Date of Birth: [birth year]  [facility] CM/SW Contact  Armanda Heritage, RN Phone Number: 03/10/2021, 2:42 PM  Clinical Narrative:    CM presented patient with bed offer, patient accepts bed at [facility].  CM reached out to rep Teena and left VM notifying her of bed acceptance.  PASRR is still pending. TOC will continue to follow. MD notified patient will need an updated covid test for facility admission.   Expected Discharge Plan: Skilled Nursing Facility Barriers to Discharge: Continued Medical Work up,Awaiting State Approval (PASRR)  Expected Discharge Plan and Services Expected Discharge Plan: Skilled Nursing Facility In-house Referral: Clinical Social Work   Post Acute Care Choice: Skilled Nursing Facility Living arrangements for the past 2 months: Homeless                                       Social Determinants of Health (SDOH) Interventions    Readmission Risk Interventions Readmission Risk Prevention Plan 03/05/2021 01/07/2021 12/10/2020  Transportation Screening Complete Complete Complete  PCP or Specialist Appt within 3-5 Days - Complete -  HRI or Home Care Consult - Complete -  Social Work Consult for Recovery Care Planning/Counseling - Complete -  Palliative Care Screening - Complete -  Medication Review Oceanographer) Complete Complete Referral to Pharmacy  PCP or Specialist appointment within 3-5 days of discharge Complete - Complete  HRI or Home Care Consult Complete - Complete  SW Recovery Care/Counseling Consult Complete - Complete  Palliative Care Screening Not Applicable - Not Applicable  Skilled Nursing Facility Complete - Not Applicable  Some recent data might be hidden

## 2021-03-10 NOTE — Plan of Care (Signed)
  Problem: Education: Goal: Knowledge of General Education information will improve Description: Including pain rating scale, medication(s)/side effects and non-pharmacologic comfort measures 03/10/2021 1554 by Theodosia Blender, RN Outcome: Progressing 03/10/2021 1551 by Theodosia Blender, RN Outcome: Progressing   Problem: Clinical Measurements: Goal: Ability to maintain clinical measurements within normal limits will improve 03/10/2021 1554 by Theodosia Blender, RN Outcome: Progressing 03/10/2021 1551 by Theodosia Blender, RN Outcome: Progressing Goal: Will remain free from infection 03/10/2021 1554 by Theodosia Blender, RN Outcome: Progressing 03/10/2021 1551 by Theodosia Blender, RN Outcome: Progressing Goal: Diagnostic test results will improve 03/10/2021 1554 by Theodosia Blender, RN Outcome: Progressing 03/10/2021 1551 by Theodosia Blender, RN Outcome: Progressing Goal: Respiratory complications will improve 03/10/2021 1554 by Theodosia Blender, RN Outcome: Progressing 03/10/2021 1551 by Theodosia Blender, RN Outcome: Progressing Goal: Cardiovascular complication will be avoided 03/10/2021 1554 by Theodosia Blender, RN Outcome: Progressing 03/10/2021 1551 by Theodosia Blender, RN Outcome: Progressing   Problem: Activity: Goal: Risk for activity intolerance will decrease 03/10/2021 1554 by Theodosia Blender, RN Outcome: Progressing 03/10/2021 1551 by Theodosia Blender, RN Outcome: Progressing   Problem: Nutrition: Goal: Adequate nutrition will be maintained 03/10/2021 1554 by Theodosia Blender, RN Outcome: Progressing 03/10/2021 1551 by Theodosia Blender, RN Outcome: Progressing   Problem: Elimination: Goal: Will not experience complications related to bowel motility 03/10/2021 1554 by Theodosia Blender, RN Outcome: Progressing 03/10/2021 1551 by Theodosia Blender, RN Outcome: Progressing Goal: Will not experience complications related to urinary retention 03/10/2021 1554 by Theodosia Blender, RN Outcome:  Progressing 03/10/2021 1551 by Theodosia Blender, RN Outcome: Progressing   Problem: Pain Managment: Goal: General experience of comfort will improve 03/10/2021 1554 by Theodosia Blender, RN Outcome: Progressing 03/10/2021 1551 by Theodosia Blender, RN Outcome: Progressing   Problem: Safety: Goal: Ability to remain free from injury will improve 03/10/2021 1554 by Theodosia Blender, RN Outcome: Progressing 03/10/2021 1551 by Theodosia Blender, RN Outcome: Progressing   Problem: Skin Integrity: Goal: Risk for impaired skin integrity will decrease 03/10/2021 1554 by Theodosia Blender, RN Outcome: Progressing 03/10/2021 1551 by Theodosia Blender, RN Outcome: Progressing   Problem: Health Behavior/Discharge Planning: Goal: Ability to manage health-related needs will improve 03/10/2021 1554 by Theodosia Blender, RN Outcome: Not Progressing 03/10/2021 1551 by Theodosia Blender, RN Outcome: Not Progressing   Problem: Coping: Goal: Level of anxiety will decrease 03/10/2021 1554 by Theodosia Blender, RN Outcome: Not Progressing 03/10/2021 1551 by Theodosia Blender, RN Outcome: Not Progressing

## 2021-03-10 NOTE — Progress Notes (Signed)
Pt refused Telemetry and 1000 medications. Want to talk to MD. MD notified.

## 2021-03-11 LAB — RESP PANEL BY RT-PCR (FLU A&B, COVID) ARPGX2
Influenza A by PCR: NEGATIVE
Influenza B by PCR: NEGATIVE
SARS Coronavirus 2 by RT PCR: NEGATIVE

## 2021-03-11 MED ORDER — IPRATROPIUM-ALBUTEROL 0.5-2.5 (3) MG/3ML IN SOLN
3.0000 mL | Freq: Three times a day (TID) | RESPIRATORY_TRACT | Status: DC
  Administered 2021-03-12: 3 mL via RESPIRATORY_TRACT
  Filled 2021-03-11 (×3): qty 3

## 2021-03-11 NOTE — Progress Notes (Signed)
pT HAS REFUSED VITALS AND AUSCULTATION FOR 3 DAYS

## 2021-03-11 NOTE — Progress Notes (Signed)
Pt refusing afternoon vitals.

## 2021-03-11 NOTE — Progress Notes (Signed)
Pt refusing physical assessment at this time, as well as vitals. Says he wants to sleep. Will try again later in the shift.

## 2021-03-11 NOTE — Progress Notes (Signed)
PROGRESS NOTE    [name]  YDX:[id number] DOB: Jan 22, 1966 DOA: 03/02/2021 PCP: Oneita Hurt, No    Brief Narrative:  55 year old M with PMH of combined CHF, cerebellar CVA with dysarthria, CAD, COPD, HTN, HLD, bipolar disorder, aggressive behavior, neuropathy, cocaine use, tobacco use, recent hospitalization from 1/17-3/6 for cerebellar CVA, CHF exacerbation and bilateral great toe osteomyelitis presenting with shortness of breath, orthopnea, edema and lower extremity pain, and admitted for acute on chronic combined CHF and possible RML pneumonia.  Patient has had 5 ED visits including this since his discharged on 3/6.  In ED, slightly tachycardic and hypertensive.  Normal saturation.  BNP, troponin and D-dimer slightly elevated. CTA and LE [place] negative for VTE.  Started on IV Lasix and antibiotics.  Pro-Cal negative.  Cefdinir discontinued given low suspicion for pneumonia.  Continued azithromycin and nebulizers for COPD.  Transitioned to p.o. Lasix.  Assessment & Plan:   Principal Problem:   Acute on chronic combined systolic (congestive) and diastolic (congestive) heart failure (HCC) Active Problems:   Schizoaffective disorder, bipolar type (HCC)   Cocaine abuse with cocaine-induced mood disorder (HCC)   Hyperlipidemia   CAD (coronary artery disease)   HTN (hypertension)   History of stroke   Substance use disorder  Acute on chronic combined systolic and diastolic CHF: TTE on 1/18 with LVEF of 20%, GH, G2-DD, moderately reduced RVSF, moderate LAE and RAE.  Presents with SOB, orthopnea, DOE and edema.  BNP elevated but less than prior values. CTA and LE [place] negative for DVT.  Still with ongoing cocaine and tobacco use.  Had about 2.2 L UOP/24 hours.  Creatinine is stable. -Pt is continued on home Lasix 40 mg daily -Closely monitor fluid status, renal functions and electrolytes -Continue Coreg, Entresto, Aldactone and hydralazine.  Now on Isordil to Imdur.  -Will need to abstain from cocaine  and etoh  RML pneumonia: Ruled out.  Procalcitonin negative.  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  No evidence of infection apart from poor hygiene.  Elevated troponin/CAD: Elevated troponin likely demand ischemia.  No chest pain. -Continue cardiac meds, aspirin and a statin as above  Elevated D-dimer: CTA and LE [place] negative for VTE.  Hypertension: SBP slightly elevated. -Cardiac meds and diuretics as above  Bipolar disorder/aggressive behavior/restlessness: Improved.  -conversing appropriately today -Appreciate input by psych             -Deemed to have capacity per discussion with psych.             -continue home Depakote             -Continue with saphris as needed for agitation  History of suspected seizure activity: -Continue Keppra.  Hyperlipidemia: -Continue atorvastatin.  Substance use disorder/tobacco use disorder/cocaine use-admitted use of cocaine and tobacco -Encouraged to stop using cocaine and smoking cigarettes -Continue nicotine patch  Bilateral lower extremity pain/neuropathy -Continue home gabapentin -Robaxin as needed for possible muscle spasm  Homelessness/generalized weakness/debility-reportedly uses wheelchair.  Per discharge summary on 3/6, patient was able to ambulate from his room to nursing station prior to discharge. -Therapy recommended CIR but not a candidate -TOC following,  anticipating placement to SNF, COVID test is neg  Leukopenia/neutropenia: Improved. -noted to  have improved  Overweight Body mass index is 28.03 kg/m.  DVT prophylaxis: Lovenox subq Code Status: Full Family Communication: Pt in room, family not at bedside  Status is: Inpatient  Remains inpatient appropriate because:Unsafe d/c plan   Dispo: The patient is from: Home              Anticipated d/c is to: SNF              Patient currently is not medically stable to d/c.   Difficult to place patient No  Consultants:   Psychiatry  Procedures:     Antimicrobials: Anti-infectives (From admission, onward)   Start     Dose/Rate Route Frequency Ordered Stop   03/02/21 2200  doxycycline (VIBRA-TABS) tablet 100 mg        100 mg Oral Every 12 hours 03/02/21 1954 03/07/21 2159   03/02/21 2200  cefdinir (OMNICEF) capsule 300 mg  Status:  Discontinued        300 mg Oral Every 12 hours 03/02/21 1954 03/03/21 1014      Subjective: Eager to go to rehab  Objective: Vitals:   03/10/21 2201 03/11/21 0006 03/11/21 0401 03/11/21 0559  BP: (!) 144/105   (!) 147/70  Pulse: 76   75  Resp: 20   20  Temp: 98.3 F (36.8 C)     TempSrc: Oral     SpO2: 100% 100% 100%   Weight:      Height:        Intake/Output Summary (Last 24 hours) at 03/11/2021 1632 Last data filed at 03/11/2021 1248 Gross per 24 hour  Intake 240 ml  Output 200 ml  Net 40 ml   Filed Weights   03/08/21 0300 03/09/21 0439 03/10/21 0500  Weight: 83.1 kg 80.8 kg 84.8 kg    Examination: General exam: Conversant, in no acute distress Respiratory system: normal chest rise, clear, no audible wheezing Cardiovascular system: regular rhythm, s1-s2 Gastrointestinal system: Nondistended, nontender, pos BS Central nervous system: No seizures, no tremors Extremities: No cyanosis, no joint deformities Skin: No rashes, no pallor Psychiatry: Affect normal // no auditory hallucinations   Data Reviewed: I have personally reviewed following labs and imaging studies  CBC: Recent Labs  Lab 03/05/21 0538  WBC 4.0   NEUTROABS 0.8*  HGB 13.4  HCT 40.8  MCV 95.3  PLT 261   Basic Metabolic Panel: Recent Labs  Lab 03/05/21 0538 03/06/21 0938  NA 136 138  K 3.6 4.1  CL 104 106  CO2 23 24  GLUCOSE 84 132*  BUN 12 12  CREATININE 1.06 1.02  CALCIUM 8.6* 8.8*  MG 1.8 1.8  PHOS 4.1 3.8   GFR: Estimated Creatinine Clearance: 84.7 mL/min (by C-G formula based on SCr of 1.02 mg/dL). Liver Function Tests: Recent Labs  Lab 03/05/21 0538 03/06/21 0938  ALBUMIN 3.3* 3.3*   No results for input(s): LIPASE, AMYLASE in the last 168 hours. No results for input(s): AMMONIA in the last 168 hours. Coagulation Profile: No results for input(s): INR, PROTIME in the last 168 hours. Cardiac Enzymes: No results for input(s): CKTOTAL, CKMB, CKMBINDEX, TROPONINI in the last 168 hours. BNP (last 3 results) No results for input(s): PROBNP in the last 8760 hours. HbA1C: No results for input(s): HGBA1C in the last 72 hours. CBG: No results for input(s): GLUCAP in the last 168 hours. Lipid Profile: No results for input(s): CHOL, HDL, LDLCALC, TRIG,  CHOLHDL, LDLDIRECT in the last 72 hours. Thyroid Function Tests: No results for input(s): TSH, T4TOTAL, FREET4, T3FREE, THYROIDAB in the last 72 hours. Anemia Panel: No results for input(s): VITAMINB12, FOLATE, FERRITIN, TIBC, IRON, RETICCTPCT in the last 72 hours. Sepsis Labs: Recent Labs  Lab 03/05/21 0538  PROCALCITON <0.10    Recent Results (from the past 240 hour(s))  Resp Panel by RT-PCR (Flu A&B, Covid) Nasopharyngeal Swab     Status: None   Collection Time: 03/02/21  6:30 PM   Specimen: Nasopharyngeal Swab; Nasopharyngeal(NP) swabs in vial transport medium  Result Value Ref Range Status   SARS Coronavirus 2 by RT PCR NEGATIVE NEGATIVE Final    Comment: (NOTE) SARS-CoV-2 target nucleic acids are NOT DETECTED.  The SARS-CoV-2 RNA is generally detectable in upper respiratory specimens during the acute phase of infection. The lowest concentration  of SARS-CoV-2 viral copies this assay can detect is 138 copies/mL. A negative result does not preclude SARS-Cov-2 infection and should not be used as the sole basis for treatment or other patient management decisions. A negative result may occur with  improper specimen collection/handling, submission of specimen other than nasopharyngeal swab, presence of viral mutation(s) within the areas targeted by this assay, and inadequate number of viral copies(<138 copies/mL). A negative result must be combined with clinical observations, patient history, and epidemiological information. The expected result is Negative.  Fact Sheet for Patients:  BloggerCourse.com  Fact Sheet for Healthcare Providers:  SeriousBroker.it  This test is no t yet approved or cleared by the [place] FDA and  has been authorized for detection and/or diagnosis of SARS-CoV-2 by FDA under an Emergency Use Authorization (EUA). This EUA will remain  in effect (meaning this test can be used) for the duration of the COVID-19 declaration under Section 564(b)(1) of the Act, 21 U.S.C.section 360bbb-3(b)(1), unless the authorization is terminated  or revoked sooner.       Influenza A by PCR NEGATIVE NEGATIVE Final   Influenza B by PCR NEGATIVE NEGATIVE Final    Comment: (NOTE) The Xpert Xpress SARS-CoV-2/FLU/RSV plus assay is intended as an aid in the diagnosis of influenza from Nasopharyngeal swab specimens and should not be used as a sole basis for treatment. Nasal washings and aspirates are unacceptable for Xpert Xpress SARS-CoV-2/FLU/RSV testing.  Fact Sheet for Patients: BloggerCourse.com  Fact Sheet for Healthcare Providers: SeriousBroker.it  This test is not yet approved or cleared by the [place] FDA and has been authorized for detection and/or diagnosis of SARS-CoV-2 by FDA under an Emergency Use  Authorization (EUA). This EUA will remain in effect (meaning this test can be used) for the duration of the COVID-19 declaration under Section 564(b)(1) of the Act, 21 U.S.C. section 360bbb-3(b)(1), unless the authorization is terminated or revoked.  Performed at [facility], [address], [place], [name] [address]   Resp Panel by RT-PCR (Flu A&B, Covid) Nasopharyngeal Swab     Status: None   Collection Time: 03/11/21 12:18 PM   Specimen: Nasopharyngeal Swab; Nasopharyngeal(NP) swabs in vial transport medium  Result Value Ref Range Status   SARS Coronavirus 2 by RT PCR NEGATIVE NEGATIVE Final    Comment: (NOTE) SARS-CoV-2 target nucleic acids are NOT DETECTED.  The SARS-CoV-2 RNA is generally detectable in upper respiratory specimens during the acute phase of infection. The lowest concentration of SARS-CoV-2 viral copies this assay can detect is 138 copies/mL. A negative result does not preclude SARS-Cov-2 infection and should not be used as the sole basis  for treatment or other patient management decisions. A negative result may occur with  improper specimen collection/handling, submission of specimen other than nasopharyngeal swab, presence of viral mutation(s) within the areas targeted by this assay, and inadequate number of viral copies(<138 copies/mL). A negative result must be combined with clinical observations, patient history, and epidemiological information. The expected result is Negative.  Fact Sheet for Patients:  BloggerCourse.com  Fact Sheet for Healthcare Providers:  SeriousBroker.it  This test is no t yet approved or cleared by the [place] FDA and  has been authorized for detection and/or diagnosis of SARS-CoV-2 by FDA under an Emergency Use Authorization (EUA). This EUA will remain  in effect (meaning this test can be used) for the duration of the COVID-19 declaration under Section  564(b)(1) of the Act, 21 U.S.C.section 360bbb-3(b)(1), unless the authorization is terminated  or revoked sooner.       Influenza A by PCR NEGATIVE NEGATIVE Final   Influenza B by PCR NEGATIVE NEGATIVE Final    Comment: (NOTE) The Xpert Xpress SARS-CoV-2/FLU/RSV plus assay is intended as an aid in the diagnosis of influenza from Nasopharyngeal swab specimens and should not be used as a sole basis for treatment. Nasal washings and aspirates are unacceptable for Xpert Xpress SARS-CoV-2/FLU/RSV testing.  Fact Sheet for Patients: BloggerCourse.com  Fact Sheet for Healthcare Providers: SeriousBroker.it  This test is not yet approved or cleared by the [place] FDA and has been authorized for detection and/or diagnosis of SARS-CoV-2 by FDA under an Emergency Use Authorization (EUA). This EUA will remain in effect (meaning this test can be used) for the duration of the COVID-19 declaration under Section 564(b)(1) of the Act, 21 U.S.C. section 360bbb-3(b)(1), unless the authorization is terminated or revoked.  Performed at [facility], [address], [place], [name] [address]      Radiology Studies: No results found.  Scheduled Meds: . aspirin  81 mg Oral Daily  . atorvastatin  40 mg Oral Daily  . bacitracin   Topical BID  . budesonide (PULMICORT) nebulizer solution  0.5 mg Nebulization BID  . carvedilol  3.125 mg Oral BID WC  . divalproex  500 mg Oral BID  . enoxaparin (LOVENOX) injection  40 mg Subcutaneous Q24H  . furosemide  40 mg Oral Daily  . gabapentin  600 mg Oral TID  . guaiFENesin  600 mg Oral BID  . hydrALAZINE  25 mg Oral Q8H  . hydrocerin   Topical BID  . ipratropium-albuterol  3 mL Nebulization TID  . isosorbide mononitrate  15 mg Oral Daily  . levETIRAcetam  500 mg Oral BID  . nicotine  21 mg Transdermal Daily  . potassium chloride  20 mEq Oral Daily  . sacubitril-valsartan  1  tablet Oral BID  . spironolactone  12.5 mg Oral Daily  . traZODone  50 mg Oral QHS   Continuous Infusions:   LOS: 8 days   Rickey Barbara, MD [facility] Pager On Amion  If 7PM-7AM, please contact night-coverage 03/11/2021, 4:32 PM

## 2021-03-11 NOTE — TOC Progression Note (Signed)
Transition of Care [facility]) - Progression Note    Patient Details  Name: [name] MRN: [id number] Date of Birth: 21-May-1966  Transition of Care [facility]) CM/SW Contact  Armanda Heritage, RN Phone Number: 03/11/2021, 3:30 PM  Clinical Narrative:    Cherlyn Roberts remains in manual review at this time.   Expected Discharge Plan: Skilled Nursing Facility Barriers to Discharge: Continued Medical Work up,Awaiting State Approval (PASRR)  Expected Discharge Plan and Services Expected Discharge Plan: Skilled Nursing Facility In-house Referral: Clinical Social Work   Post Acute Care Choice: Skilled Nursing Facility Living arrangements for the past 2 months: Homeless                                       Social Determinants of Health (SDOH) Interventions    Readmission Risk Interventions Readmission Risk Prevention Plan 03/05/2021 01/07/2021 12/10/2020  Transportation Screening Complete Complete Complete  PCP or Specialist Appt within 3-5 Days - Complete -  HRI or Home Care Consult - Complete -  Social Work Consult for Recovery Care Planning/Counseling - Complete -  Palliative Care Screening - Complete -  Medication Review [employer]) Complete Complete Referral to Pharmacy  PCP or Specialist appointment within 3-5 days of discharge Complete - Complete  HRI or Home Care Consult Complete - Complete  SW Recovery Care/Counseling Consult Complete - Complete  Palliative Care Screening Not Applicable - Not Applicable  Skilled Nursing Facility Complete - Not Applicable  Some recent data might be hidden

## 2021-03-12 MED ORDER — HYDRALAZINE HCL 25 MG PO TABS: 25.0000 mg | ORAL_TABLET | Freq: Three times a day (TID) | ORAL | 0 refills | Status: DC

## 2021-03-12 MED ORDER — ASENAPINE MALEATE 5 MG SL SUBL
5.0000 mg | SUBLINGUAL_TABLET | Freq: Two times a day (BID) | SUBLINGUAL | 0 refills | Status: DC | PRN
Start: 2021-03-12 — End: 2021-11-04

## 2021-03-12 MED ORDER — POTASSIUM CHLORIDE 20 MEQ PO PACK: 20.0000 meq | PACK | Freq: Every day | ORAL | 0 refills | Status: DC

## 2021-03-12 MED ORDER — BENZONATATE 100 MG PO CAPS: 100.0000 mg | ORAL_CAPSULE | Freq: Three times a day (TID) | ORAL | 0 refills | Status: DC | PRN

## 2021-03-12 MED ORDER — ISOSORBIDE MONONITRATE ER 30 MG PO TB24: 15.0000 mg | ORAL_TABLET | Freq: Every day | ORAL | 0 refills | Status: DC

## 2021-03-12 MED ORDER — GUAIFENESIN-DM 100-10 MG/5ML PO SYRP: 5.0000 mL | ORAL_SOLUTION | Freq: Four times a day (QID) | ORAL | 0 refills | Status: DC | PRN

## 2021-03-12 NOTE — Discharge Summary (Signed)
Physician Discharge Summary  [name] ZOX:[id number] DOB: 12/05/1965 DOA: 03/02/2021  PCP: Pcp, No  Admit date: 03/02/2021 Discharge date: 03/12/2021  Admitted From: Home Disposition:  SNF  Recommendations for Outpatient Follow-up:  1. Follow up with PCP in 1-2 weeks 2. Recommend recheck bmet in 1-2 weeks  Discharge Condition:Improved CODE STATUS:Full Diet recommendation: Heart healthy   Brief/Interim Summary: 55 year old M with PMH of combined CHF, cerebellar CVA with dysarthria, CAD, COPD, HTN, HLD, bipolar disorder, aggressive behavior, neuropathy, cocaine use, tobacco use, recent hospitalization from 1/17-3/6 for cerebellar CVA, CHF exacerbation and bilateral great toe osteomyelitis presenting with shortness of breath, orthopnea, edema and lower extremity pain, and admitted for acute on chronic combined CHF and possible RML pneumonia. Patient has had 5 ED visits including this since his discharged on 3/6.  In ED, slightly tachycardic and hypertensive. Normal saturation. BNP, troponin and D-dimer slightly elevated. CTA and LE [place] negative for VTE. Patient was given a course of lasix as well as antibiotics  Discharge Diagnoses:  Principal Problem:   Acute on chronic combined systolic (congestive) and diastolic (congestive) heart failure (HCC) Active Problems:   Schizoaffective disorder, bipolar type (HCC)   Cocaine abuse with cocaine-induced mood disorder (HCC)   Hyperlipidemia   CAD (coronary artery disease)   HTN (hypertension)   History of stroke   Substance use disorder  Acute on chronic combined systolic and diastolic CHF: TTE on 1/18 with LVEF of 20%, GH, G2-DD, moderately reduced RVSF, moderate LAE and RAE. Presents with SOB, orthopnea, DOE and edema. BNP was elevated but less than prior values. CTA and LE [place] negative for DVT. Still with ongoing cocaine and tobacco use. Had about 2.2 L UOP/24 hours. Creatinine is stable. -Pt is continued on home Lasix 40 mg  daily -Continue Coreg, Entresto, Aldactone and hydralazine. Now on Isordil to Imdur.  -Will need to abstain from cocaine and etoh  RML pneumonia:Ruled out.Procalcitonin negative.  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 No evidence of infection apart from poor hygiene.  Elevated troponin/CAD: Elevated troponin likely demand ischemia. No chest pain. -Continue cardiac meds, aspirin and a statin as above  Elevated D-dimer:CTA and LE [place] negative for VTE.  Hypertension: SBP slightly elevated. -Cardiac meds and diuretics as above  Bipolar disorder/aggressive behavior/restlessness:Improved.  -conversing appropriately today -Appreciate input by psych -Deemed to have capacity per discussion with psych. -continue home Depakote -Continue with saphris as neededfor agitation  History of suspected seizure activity: -Continue Keppra.  Hyperlipidemia: -Continue atorvastatin.  Substance use disorder/tobacco use disorder/cocaine use-admitted use of cocaine and tobacco -Encouraged to stop using cocaine and smoking cigarettes -Continue nicotine patch  Bilateral lower extremity pain/neuropathy -Continue home gabapentin -Robaxin as needed for possible muscle spasm  Homelessness/generalized weakness/debility-reportedly uses wheelchair. Per discharge summary on 3/6, patient was able to ambulate from his room to nursing station prior to discharge. -For SNF today. COVID test is  neg  Leukopenia/neutropenia:Improved. -noted to have improved  Overweight Body mass index is 28.03 kg/m.   Discharge Instructions   Allergies as of 03/12/2021      Reactions   Ibuprofen Anaphylaxis   Acetaminophen Hives, Nausea And Vomiting, Rash   Pt  overdosed on bottle of APAP 05/06/16   Latex Other (See Comments)   Pt denies allergy to latex 03/02/2021   Lyrica Cr [pregabalin Er] Other (See Comments)   Dystonia and dysarthria   Peanut-containing Drug Products Other (See Comments)   Pt denies allergy to peanut butter 03/02/2021      Medication List    STOP taking these medications   isosorbide dinitrate 5 MG tablet Commonly known as: ISORDIL   senna-docusate 8.6-50 MG tablet Commonly known as: Senokot-S     TAKE these medications   albuterol 108 (90 Base) MCG/ACT inhaler Commonly known as: VENTOLIN HFA Inhale 2 puffs into the lungs every 6 (six) hours as needed for wheezing or shortness of breath.   asenapine 5 MG Subl 24 hr tablet Commonly known as: SAPHRIS Place 1 tablet (5 mg total) under the tongue 2 (two) times daily as needed (Aggression).   aspirin 81 MG chewable tablet Chew 1 tablet (81 mg total) by mouth daily.   atorvastatin 40 MG tablet Commonly known as: LIPITOR TAKE 1 TABLET (40 MG TOTAL) BY MOUTH DAILY. What changed: how much to take   benzonatate 100 MG capsule Commonly known as: TESSALON Take 1 capsule (100 mg total) by mouth 3 (three) times daily as needed for cough.   carvedilol 3.125 MG tablet Commonly known as: COREG Take 1 tablet (3.125 mg total) by mouth 2 (two) times daily with a meal.   divalproex 500 MG 24 hr tablet Commonly known as: DEPAKOTE ER Take 1 tablet (500 mg total) by mouth in the morning and at bedtime.   furosemide 40 MG tablet Commonly known as: LASIX Take 1 tablet (40 mg total) by mouth daily.   gabapentin 300 MG capsule Commonly known as: NEURONTIN Take 2 capsules (600 mg total) by mouth 3 (three) times  daily.   guaiFENesin-dextromethorphan 100-10 MG/5ML syrup Commonly known as: ROBITUSSIN DM Take 5 mLs by mouth every 6 (six) hours as needed for cough.   hydrALAZINE 25 MG tablet Commonly known as: APRESOLINE Take 1 tablet (25 mg total) by mouth every 8 (eight) hours. What changed:   medication strength  how much to take   Incruse Ellipta 62.5 MCG/INH Aepb Generic drug: umeclidinium bromide Inhale 1 puff into the lungs daily.   isosorbide mononitrate 30 MG 24 hr tablet Commonly known as: IMDUR Take 0.5 tablets (15 mg total) by mouth daily. What changed: how much to take   levETIRAcetam 500 MG tablet Commonly known as: KEPPRA Take 1 tablet (500 mg total) by mouth 2 (two) times daily.   magnesium oxide 400 (241.3 Mg) MG tablet Commonly known as: MAG-OX Take 1 tablet (400 mg total) by mouth 2 (two) times daily.   potassium chloride 20 MEQ packet Commonly known as: KLOR-CON Take 20 mEq by mouth daily. Start taking on: March 13, 2021   sacubitril-valsartan 24-26 MG Commonly known as: ENTRESTO Take 1 tablet by mouth 2 (two) times daily.   spironolactone 25 MG tablet Commonly known as: ALDACTONE Take 0.5 tablets (12.5 mg total) by mouth daily.   traZODone 50 MG tablet Commonly known as: DESYREL Take 1 tablet (50 mg total) by mouth at bedtime.       Contact information for after-discharge care    Destination    [facility] .   Service: Skilled Nursing Contact information: [address] [place] [address] [phone]  Allergies  Allergen Reactions  . Ibuprofen Anaphylaxis  . Acetaminophen Hives, Nausea And Vomiting and Rash    Pt overdosed on bottle of APAP 05/06/16   . Latex Other (See Comments)    Pt denies allergy to latex 03/02/2021  . Lyrica Cr [Pregabalin Er] Other (See Comments)    Dystonia and dysarthria  . Peanut-Containing Drug Products Other (See Comments)    Pt denies allergy to  peanut butter 03/02/2021    Consultations:  Psychiatry  Procedures/Studies: CT Angio Chest PE W and/or Wo Contrast  Result Date: 03/02/2021 CLINICAL DATA:  55 year old male with positive D-dimer. Concern for pulmonary embolism. EXAM: CT ANGIOGRAPHY CHEST WITH CONTRAST TECHNIQUE: Multidetector CT imaging of the chest was performed using the standard protocol during bolus administration of intravenous contrast. Multiplanar CT image reconstructions and MIPs were obtained to evaluate the vascular anatomy. CONTRAST:  OMNIPAQUE IOHEXOL 350 MG/ML SOLN COMPARISON:  Chest radiograph dated 03/02/2021. FINDINGS: Evaluation is limited due to respiratory motion artifact as well as streak artifact caused by patient's arms. Cardiovascular: Mild cardiomegaly. There is 3 vessel coronary vascular calcification. No pericardial effusion. There is mild atherosclerotic calcification of the thoracic aorta. Evaluation of the pulmonary arteries is limited due to respiratory motion artifact. No pulmonary artery embolus identified. Mediastinum/Nodes: Mildly enlarged right hilar lymph nodes, likely reactive. The esophagus is grossly unremarkable. No mediastinal fluid collection. Lungs/Pleura: Background of emphysema. There is faint cluster of nodularity in the right middle lobe most consistent with pneumonia, likely viral or atypical in etiology. Clinical correlation is recommended. No lobar consolidation, pleural effusion, pneumothorax. The central airways are patent. Upper Abdomen: No acute abnormality. Musculoskeletal: Degenerative changes of the spine. No acute osseous pathology. Review of the MIP images confirms the above findings. IMPRESSION: 1. No CT evidence of pulmonary embolism. 2. Right middle lobe pneumonia, likely viral or atypical in etiology. Clinical correlation is recommended. 3. Aortic Atherosclerosis (ICD10-I70.0) and Emphysema (ICD10-J43.9). Electronically Signed   By: Elgie Collard M.D.   On: 03/02/2021  17:22   DG Chest Port 1 View  Result Date: 03/02/2021 CLINICAL DATA:  Bilateral leg pain and swelling.  Productive cough. EXAM: PORTABLE CHEST 1 VIEW COMPARISON:  November 27, 2020.  December 21, 2020 FINDINGS: There is mild prominence the hila, similar since November 27, 2020. Cardiomegaly is stable. The mediastinum is unchanged. No pneumothorax. No nodules or masses. Mild opacity in the right base is favored represent atelectasis with developing infiltrate considered less likely. No overt edema. IMPRESSION: 1. Hilar prominence is similar since comparison studies and could be technical in nature given portable imaging and normal hilar structures on the November 13, 2020 PA and lateral chest x-ray. A PA and lateral chest x-ray could better evaluate. 2. Stable cardiomegaly. 3. Mild opacity in the right base is favored represent atelectasis. Early developing infiltrate considered less likely. 4. No overt edema. Electronically Signed   By: Gerome Sam III M.D   On: 03/02/2021 14:58   DG Foot Complete Left  Result Date: 02/19/2021 CLINICAL DATA:  Bilateral foot pain, swelling, bilateral first ray amputations 01/02/2021 EXAM: LEFT FOOT - COMPLETE 3+ VIEW; RIGHT FOOT COMPLETE - 3+ VIEW COMPARISON:  Radiograph 01/02/2021 FINDINGS: Right foot: Postsurgical changes from prior first ray amputation at the level of the metatarsophalangeal joint. Interval removal the overlying skin staples. Some mild residual soft tissue swelling is noted at the level of the amputation. Some mild cutaneous irregularity, correlate for ulceration or laceration. No soft tissue gas or foreign body. No convincing radiographic  sites of osteomyelitis at this time. Stable degenerative changes in the mid and hindfoot including plantar calcaneal spur. Left foot: Postsurgical changes from prior first ray amputation at the level of the metatarsophalangeal joint. Interval removal of overlying skin staple seen previously. Some mild residual  cutaneous irregularity, correlate for postsurgical change, ulceration or laceration. No soft tissue gas or foreign body is seen. No subjacent areas of osseous erosion, destructive change or other suspicious features of osteomyelitis are seen at this time. Degenerative changes in the mid and hindfoot with plantar calcaneal spurring. IMPRESSION: 1. Postsurgical changes from prior bilateral first ray amputation at the level of the metatarsophalangeal joints. 2. Bilateral residual swelling at the site of the first ray amputations with some overlying cutaneous irregularity, correlate with visual inspection. 3. No convincing radiographic evidence of osteomyelitis at this time however if there is persisting clinical concern MR imaging could be obtained. 4. No soft tissue gas or foreign body. 5. Degenerative changes throughout the mid and hind feet with bilateral plantar calcaneal spurs. Electronically Signed   By: Kreg Shropshire M.D.   On: 02/19/2021 03:23   DG Foot Complete Right  Result Date: 02/19/2021 CLINICAL DATA:  Bilateral foot pain, swelling, bilateral first ray amputations 01/02/2021 EXAM: LEFT FOOT - COMPLETE 3+ VIEW; RIGHT FOOT COMPLETE - 3+ VIEW COMPARISON:  Radiograph 01/02/2021 FINDINGS: Right foot: Postsurgical changes from prior first ray amputation at the level of the metatarsophalangeal joint. Interval removal the overlying skin staples. Some mild residual soft tissue swelling is noted at the level of the amputation. Some mild cutaneous irregularity, correlate for ulceration or laceration. No soft tissue gas or foreign body. No convincing radiographic sites of osteomyelitis at this time. Stable degenerative changes in the mid and hindfoot including plantar calcaneal spur. Left foot: Postsurgical changes from prior first ray amputation at the level of the metatarsophalangeal joint. Interval removal of overlying skin staple seen previously. Some mild residual cutaneous irregularity, correlate for  postsurgical change, ulceration or laceration. No soft tissue gas or foreign body is seen. No subjacent areas of osseous erosion, destructive change or other suspicious features of osteomyelitis are seen at this time. Degenerative changes in the mid and hindfoot with plantar calcaneal spurring. IMPRESSION: 1. Postsurgical changes from prior bilateral first ray amputation at the level of the metatarsophalangeal joints. 2. Bilateral residual swelling at the site of the first ray amputations with some overlying cutaneous irregularity, correlate with visual inspection. 3. No convincing radiographic evidence of osteomyelitis at this time however if there is persisting clinical concern MR imaging could be obtained. 4. No soft tissue gas or foreign body. 5. Degenerative changes throughout the mid and hind feet with bilateral plantar calcaneal spurs. Electronically Signed   By: Kreg Shropshire M.D.   On: 02/19/2021 03:23   VAS [place] LOWER EXTREMITY VENOUS (DVT) (ONLY [facility] & [facility] 7a-7p)  Result Date: 03/03/2021  Lower Venous DVT Study Indications: Edema.  Limitations: Patient refused to remove pants and was unable to recline for study, secondary to severe shortness of breath. Patient could not tolerate full compression in calves secondary to pain. Comparison Study: No prior study on file Performing Technologist: Sherren Kerns RVS  Examination Guidelines: A complete evaluation includes B-mode imaging, spectral Doppler, color Doppler, and power Doppler as needed of all accessible portions of each vessel. Bilateral testing is considered an integral part of a complete examination. Limited examinations for reoccurring indications may be performed as noted. The reflux portion of the exam is performed with the patient in  reverse Trendelenburg.  +---------+---------------+---------+-----------+----------+--------------+ RIGHT    CompressibilityPhasicitySpontaneityPropertiesThrombus Aging  +---------+---------------+---------+-----------+----------+--------------+ CFV                                                   Not insonated  +---------+---------------+---------+-----------+----------+--------------+ SFJ                                                   Not insonated  +---------+---------------+---------+-----------+----------+--------------+ FV Prox                                               Not insonated  +---------+---------------+---------+-----------+----------+--------------+ FV Mid                                                Not insonated  +---------+---------------+---------+-----------+----------+--------------+ FV Distal                                             Not insonated  +---------+---------------+---------+-----------+----------+--------------+ PFV                                                   Not insonated  +---------+---------------+---------+-----------+----------+--------------+ POP      Full           Yes      Yes                                 +---------+---------------+---------+-----------+----------+--------------+ PTV      Full                                                        +---------+---------------+---------+-----------+----------+--------------+ PERO     Full                                                        +---------+---------------+---------+-----------+----------+--------------+   +---------+---------------+---------+-----------+----------+---------------+ LEFT     CompressibilityPhasicitySpontaneityPropertiesThrombus Aging  +---------+---------------+---------+-----------+----------+---------------+ CFV                                                   Not insonated   +---------+---------------+---------+-----------+----------+---------------+ SFJ  Not insonated    +---------+---------------+---------+-----------+----------+---------------+ FV Prox                                               Not insonated   +---------+---------------+---------+-----------+----------+---------------+ FV Mid                                                Not insonated   +---------+---------------+---------+-----------+----------+---------------+ FV Distal                                             Not insonated   +---------+---------------+---------+-----------+----------+---------------+ PFV                                                   Not insonated   +---------+---------------+---------+-----------+----------+---------------+ POP      Full           Yes      Yes                                  +---------+---------------+---------+-----------+----------+---------------+ PTV                                                   patent by color +---------+---------------+---------+-----------+----------+---------------+ PERO                                                  patent by color +---------+---------------+---------+-----------+----------+---------------+     Summary: RIGHT: - There is no evidence of deep vein thrombosis in the lower extremity. However, portions of this examination were limited- see technologist comments above.  significant interstitial edema noted throughout the calf  LEFT: - There is no evidence of deep vein thrombosis in the lower extremity. However, portions of this examination were limited- see technologist comments above.  - significant interstitial edema noted throughout the calf  *See table(s) above for measurements and observations. Electronically signed by Sherald Hesshristopher Clark MD on 03/03/2021 at 11:41:25 AM.    Final     Subjective: Eager to go to SNF  Discharge Exam: Vitals:   03/11/21 2137 03/12/21 0728  BP: 104/88   Pulse: 89   Resp: 20   Temp: 98.2 F (36.8 C)   SpO2: 96% 100%   Vitals:    03/11/21 0401 03/11/21 0559 03/11/21 2137 03/12/21 0728  BP:  (!) 147/70 104/88   Pulse:  75 89   Resp:  20 20   Temp:   98.2 F (36.8 C)   TempSrc:   Oral   SpO2: 100%  96% 100%  Weight:      Height:        General: Pt is alert, awake,  not in acute distress Cardiovascular: RRR, S1/S2 +, no rubs, no gallops Respiratory: CTA bilaterally, no wheezing, no rhonchi Abdominal: Soft, NT, ND, bowel sounds + Extremities: no edema, no cyanosis   The results of significant diagnostics from this hospitalization (including imaging, microbiology, ancillary and laboratory) are listed below for reference.     Microbiology: Recent Results (from the past 240 hour(s))  Resp Panel by RT-PCR (Flu A&B, Covid) Nasopharyngeal Swab     Status: None   Collection Time: 03/02/21  6:30 PM   Specimen: Nasopharyngeal Swab; Nasopharyngeal(NP) swabs in vial transport medium  Result Value Ref Range Status   SARS Coronavirus 2 by RT PCR NEGATIVE NEGATIVE Final    Comment: (NOTE) SARS-CoV-2 target nucleic acids are NOT DETECTED.  The SARS-CoV-2 RNA is generally detectable in upper respiratory specimens during the acute phase of infection. The lowest concentration of SARS-CoV-2 viral copies this assay can detect is 138 copies/mL. A negative result does not preclude SARS-Cov-2 infection and should not be used as the sole basis for treatment or other patient management decisions. A negative result may occur with  improper specimen collection/handling, submission of specimen other than nasopharyngeal swab, presence of viral mutation(s) within the areas targeted by this assay, and inadequate number of viral copies(<138 copies/mL). A negative result must be combined with clinical observations, patient history, and epidemiological information. The expected result is Negative.  Fact Sheet for Patients:  BloggerCourse.com  Fact Sheet for Healthcare Providers:   SeriousBroker.it  This test is no t yet approved or cleared by the [place] FDA and  has been authorized for detection and/or diagnosis of SARS-CoV-2 by FDA under an Emergency Use Authorization (EUA). This EUA will remain  in effect (meaning this test can be used) for the duration of the COVID-19 declaration under Section 564(b)(1) of the Act, 21 U.S.C.section 360bbb-3(b)(1), unless the authorization is terminated  or revoked sooner.       Influenza A by PCR NEGATIVE NEGATIVE Final   Influenza B by PCR NEGATIVE NEGATIVE Final    Comment: (NOTE) The Xpert Xpress SARS-CoV-2/FLU/RSV plus assay is intended as an aid in the diagnosis of influenza from Nasopharyngeal swab specimens and should not be used as a sole basis for treatment. Nasal washings and aspirates are unacceptable for Xpert Xpress SARS-CoV-2/FLU/RSV testing.  Fact Sheet for Patients: BloggerCourse.com  Fact Sheet for Healthcare Providers: SeriousBroker.it  This test is not yet approved or cleared by the [place] FDA and has been authorized for detection and/or diagnosis of SARS-CoV-2 by FDA under an Emergency Use Authorization (EUA). This EUA will remain in effect (meaning this test can be used) for the duration of the COVID-19 declaration under Section 564(b)(1) of the Act, 21 U.S.C. section 360bbb-3(b)(1), unless the authorization is terminated or revoked.  Performed at [facility], [address], [place], [name] [address]   Resp Panel by RT-PCR (Flu A&B, Covid) Nasopharyngeal Swab     Status: None   Collection Time: 03/11/21 12:18 PM   Specimen: Nasopharyngeal Swab; Nasopharyngeal(NP) swabs in vial transport medium  Result Value Ref Range Status   SARS Coronavirus 2 by RT PCR NEGATIVE NEGATIVE Final    Comment: (NOTE) SARS-CoV-2 target nucleic acids are NOT DETECTED.  The SARS-CoV-2 RNA is generally  detectable in upper respiratory specimens during the acute phase of infection. The lowest concentration of SARS-CoV-2 viral copies this assay can detect is 138 copies/mL. A negative result does not preclude SARS-Cov-2 infection and should not be used as the  sole basis for treatment or other patient management decisions. A negative result may occur with  improper specimen collection/handling, submission of specimen other than nasopharyngeal swab, presence of viral mutation(s) within the areas targeted by this assay, and inadequate number of viral copies(<138 copies/mL). A negative result must be combined with clinical observations, patient history, and epidemiological information. The expected result is Negative.  Fact Sheet for Patients:  BloggerCourse.com  Fact Sheet for Healthcare Providers:  SeriousBroker.it  This test is no t yet approved or cleared by the [place] FDA and  has been authorized for detection and/or diagnosis of SARS-CoV-2 by FDA under an Emergency Use Authorization (EUA). This EUA will remain  in effect (meaning this test can be used) for the duration of the COVID-19 declaration under Section 564(b)(1) of the Act, 21 U.S.C.section 360bbb-3(b)(1), unless the authorization is terminated  or revoked sooner.       Influenza A by PCR NEGATIVE NEGATIVE Final   Influenza B by PCR NEGATIVE NEGATIVE Final    Comment: (NOTE) The Xpert Xpress SARS-CoV-2/FLU/RSV plus assay is intended as an aid in the diagnosis of influenza from Nasopharyngeal swab specimens and should not be used as a sole basis for treatment. Nasal washings and aspirates are unacceptable for Xpert Xpress SARS-CoV-2/FLU/RSV testing.  Fact Sheet for Patients: BloggerCourse.com  Fact Sheet for Healthcare Providers: SeriousBroker.it  This test is not yet approved or cleared by the [place] FDA  and has been authorized for detection and/or diagnosis of SARS-CoV-2 by FDA under an Emergency Use Authorization (EUA). This EUA will remain in effect (meaning this test can be used) for the duration of the COVID-19 declaration under Section 564(b)(1) of the Act, 21 U.S.C. section 360bbb-3(b)(1), unless the authorization is terminated or revoked.  Performed at [facility], [address], [place], [name] [address]      Labs: BNP (last 3 results) Recent Labs    11/12/20 1946 11/27/20 0123 03/02/21 1530  BNP 1,692.4* 992.7* 424.6*   Basic Metabolic Panel: Recent Labs  Lab 03/06/21 0938  NA 138  K 4.1  CL 106  CO2 24  GLUCOSE 132*  BUN 12  CREATININE 1.02  CALCIUM 8.8*  MG 1.8  PHOS 3.8   Liver Function Tests: Recent Labs  Lab 03/06/21 0938  ALBUMIN 3.3*   No results for input(s): LIPASE, AMYLASE in the last 168 hours. No results for input(s): AMMONIA in the last 168 hours. CBC: No results for input(s): WBC, NEUTROABS, HGB, HCT, MCV, PLT in the last 168 hours. Cardiac Enzymes: No results for input(s): CKTOTAL, CKMB, CKMBINDEX, TROPONINI in the last 168 hours. BNP: Invalid input(s): POCBNP CBG: No results for input(s): GLUCAP in the last 168 hours. D-Dimer No results for input(s): DDIMER in the last 72 hours. Hgb A1c No results for input(s): HGBA1C in the last 72 hours. Lipid Profile No results for input(s): CHOL, HDL, LDLCALC, TRIG, CHOLHDL, LDLDIRECT in the last 72 hours. Thyroid function studies No results for input(s): TSH, T4TOTAL, T3FREE, THYROIDAB in the last 72 hours.  Invalid input(s): FREET3 Anemia work up No results for input(s): VITAMINB12, FOLATE, FERRITIN, TIBC, IRON, RETICCTPCT in the last 72 hours. Urinalysis    Component Value Date/Time   COLORURINE AMBER (A) 02/09/2021 1455   APPEARANCEUR CLEAR 02/09/2021 1455   LABSPEC 1.030 02/09/2021 1455   PHURINE 5.0 02/09/2021 1455   GLUCOSEU NEGATIVE 02/09/2021 1455    HGBUR NEGATIVE 02/09/2021 1455   BILIRUBINUR NEGATIVE 02/09/2021 1455   KETONESUR 5 (A) 02/09/2021  1455   PROTEINUR 30 (A) 02/09/2021 1455   NITRITE NEGATIVE 02/09/2021 1455   LEUKOCYTESUR NEGATIVE 02/09/2021 1455   Sepsis Labs Invalid input(s): PROCALCITONIN,  WBC,  LACTICIDVEN Microbiology Recent Results (from the past 240 hour(s))  Resp Panel by RT-PCR (Flu A&B, Covid) Nasopharyngeal Swab     Status: None   Collection Time: 03/02/21  6:30 PM   Specimen: Nasopharyngeal Swab; Nasopharyngeal(NP) swabs in vial transport medium  Result Value Ref Range Status   SARS Coronavirus 2 by RT PCR NEGATIVE NEGATIVE Final    Comment: (NOTE) SARS-CoV-2 target nucleic acids are NOT DETECTED.  The SARS-CoV-2 RNA is generally detectable in upper respiratory specimens during the acute phase of infection. The lowest concentration of SARS-CoV-2 viral copies this assay can detect is 138 copies/mL. A negative result does not preclude SARS-Cov-2 infection and should not be used as the sole basis for treatment or other patient management decisions. A negative result may occur with  improper specimen collection/handling, submission of specimen other than nasopharyngeal swab, presence of viral mutation(s) within the areas targeted by this assay, and inadequate number of viral copies(<138 copies/mL). A negative result must be combined with clinical observations, patient history, and epidemiological information. The expected result is Negative.  Fact Sheet for Patients:  BloggerCourse.com  Fact Sheet for Healthcare Providers:  SeriousBroker.it  This test is no t yet approved or cleared by the [place] FDA and  has been authorized for detection and/or diagnosis of SARS-CoV-2 by FDA under an Emergency Use Authorization (EUA). This EUA will remain  in effect (meaning this test can be used) for the duration of the COVID-19 declaration under Section  564(b)(1) of the Act, 21 U.S.C.section 360bbb-3(b)(1), unless the authorization is terminated  or revoked sooner.       Influenza A by PCR NEGATIVE NEGATIVE Final   Influenza B by PCR NEGATIVE NEGATIVE Final    Comment: (NOTE) The Xpert Xpress SARS-CoV-2/FLU/RSV plus assay is intended as an aid in the diagnosis of influenza from Nasopharyngeal swab specimens and should not be used as a sole basis for treatment. Nasal washings and aspirates are unacceptable for Xpert Xpress SARS-CoV-2/FLU/RSV testing.  Fact Sheet for Patients: BloggerCourse.com  Fact Sheet for Healthcare Providers: SeriousBroker.it  This test is not yet approved or cleared by the [place] FDA and has been authorized for detection and/or diagnosis of SARS-CoV-2 by FDA under an Emergency Use Authorization (EUA). This EUA will remain in effect (meaning this test can be used) for the duration of the COVID-19 declaration under Section 564(b)(1) of the Act, 21 U.S.C. section 360bbb-3(b)(1), unless the authorization is terminated or revoked.  Performed at [facility], [address], [place], [name] [address]   Resp Panel by RT-PCR (Flu A&B, Covid) Nasopharyngeal Swab     Status: None   Collection Time: 03/11/21 12:18 PM   Specimen: Nasopharyngeal Swab; Nasopharyngeal(NP) swabs in vial transport medium  Result Value Ref Range Status   SARS Coronavirus 2 by RT PCR NEGATIVE NEGATIVE Final    Comment: (NOTE) SARS-CoV-2 target nucleic acids are NOT DETECTED.  The SARS-CoV-2 RNA is generally detectable in upper respiratory specimens during the acute phase of infection. The lowest concentration of SARS-CoV-2 viral copies this assay can detect is 138 copies/mL. A negative result does not preclude SARS-Cov-2 infection and should not be used as the sole basis for treatment or other patient management decisions. A negative result may occur with   improper specimen collection/handling, submission of specimen other than nasopharyngeal  swab, presence of viral mutation(s) within the areas targeted by this assay, and inadequate number of viral copies(<138 copies/mL). A negative result must be combined with clinical observations, patient history, and epidemiological information. The expected result is Negative.  Fact Sheet for Patients:  BloggerCourse.com  Fact Sheet for Healthcare Providers:  SeriousBroker.it  This test is no t yet approved or cleared by the [place] FDA and  has been authorized for detection and/or diagnosis of SARS-CoV-2 by FDA under an Emergency Use Authorization (EUA). This EUA will remain  in effect (meaning this test can be used) for the duration of the COVID-19 declaration under Section 564(b)(1) of the Act, 21 U.S.C.section 360bbb-3(b)(1), unless the authorization is terminated  or revoked sooner.       Influenza A by PCR NEGATIVE NEGATIVE Final   Influenza B by PCR NEGATIVE NEGATIVE Final    Comment: (NOTE) The Xpert Xpress SARS-CoV-2/FLU/RSV plus assay is intended as an aid in the diagnosis of influenza from Nasopharyngeal swab specimens and should not be used as a sole basis for treatment. Nasal washings and aspirates are unacceptable for Xpert Xpress SARS-CoV-2/FLU/RSV testing.  Fact Sheet for Patients: BloggerCourse.com  Fact Sheet for Healthcare Providers: SeriousBroker.it  This test is not yet approved or cleared by the [place] FDA and has been authorized for detection and/or diagnosis of SARS-CoV-2 by FDA under an Emergency Use Authorization (EUA). This EUA will remain in effect (meaning this test can be used) for the duration of the COVID-19 declaration under Section 564(b)(1) of the Act, 21 U.S.C. section 360bbb-3(b)(1), unless the authorization is terminated  or revoked.  Performed at [facility], [address], [place], [name] [address]    Time spent: 30 min  SIGNED:   Rickey Barbara, MD  [facility] 03/12/2021, 11:41 AM  If 7PM-7AM, please contact night-coverage

## 2021-03-12 NOTE — Progress Notes (Signed)
Patient requested bedside med at this time and when med was offered, pt refused. Education provided including his seizure med but said he has no Seizure and insists he is not taking it.

## 2021-03-12 NOTE — TOC Transition Note (Addendum)
Transition of Care [facility]) - CM/SW Discharge Note   Patient Details  Name: [name] MRN: [id number] Date of Birth: 02-Aug-1966  Transition of Care (TOC) CM/SW Contact:  Armanda Heritage, RN Phone Number: 03/12/2021, 11:50 AM   Clinical Narrative:    PASRR number received [id number] F.  CM confirms bed availability at [facility], patient to dc to room 123 A.  [facility] transport arranged.   Final next level of care: Skilled Nursing Facility Barriers to Discharge: No Barriers Identified   Patient Goals and CMS Choice Patient states their goals for this hospitalization and ongoing recovery are:: go to rehab CMS Medicare.gov Compare Post Acute Care list provided to:: Patient    Discharge Placement                       Discharge Plan and Services In-house Referral: Clinical Social Work   Post Acute Care Choice: Skilled Nursing Facility                               Social Determinants of Health (SDOH) Interventions     Readmission Risk Interventions Readmission Risk Prevention Plan 03/05/2021 01/07/2021 12/10/2020  Transportation Screening Complete Complete Complete  PCP or Specialist Appt within 3-5 Days - Complete -  HRI or Home Care Consult - Complete -  Social Work Consult for Recovery Care Planning/Counseling - Complete -  Palliative Care Screening - Complete -  Medication Review [facility]) Complete Complete Referral to Pharmacy  PCP or Specialist appointment within 3-5 days of discharge Complete - Complete  HRI or Home Care Consult Complete - Complete  SW Recovery Care/Counseling Consult Complete - Complete  Palliative Care Screening Not Applicable - Not Applicable  Skilled Nursing Facility Complete - Not Applicable  Some recent data might be hidden

## 2021-03-12 NOTE — Progress Notes (Signed)
Pt is requesting that his breathing treatment be given Q4.

## 2021-04-17 ENCOUNTER — Other Ambulatory Visit: Payer: Self-pay

## 2021-04-17 DIAGNOSIS — R0989 Other specified symptoms and signs involving the circulatory and respiratory systems: Secondary | ICD-10-CM

## 2021-04-23 ENCOUNTER — Ambulatory Visit (INDEPENDENT_AMBULATORY_CARE_PROVIDER_SITE_OTHER): Payer: Medicaid Other | Admitting: Vascular Surgery

## 2021-04-23 ENCOUNTER — Ambulatory Visit (HOSPITAL_COMMUNITY)
Admission: RE | Admit: 2021-04-23 | Discharge: 2021-04-23 | Disposition: A | Discharge status: Home / Self Care | Payer: Medicaid Other | Source: Ambulatory Visit | Attending: Vascular Surgery | Admitting: Vascular Surgery

## 2021-04-23 ENCOUNTER — Other Ambulatory Visit: Payer: Self-pay

## 2021-04-23 ENCOUNTER — Encounter: Payer: Self-pay | Admitting: Vascular Surgery

## 2021-04-23 DIAGNOSIS — M79671 Pain in right foot: Secondary | ICD-10-CM | POA: Diagnosis not present

## 2021-04-23 DIAGNOSIS — R0989 Other specified symptoms and signs involving the circulatory and respiratory systems: Secondary | ICD-10-CM | POA: Insufficient documentation

## 2021-04-23 DIAGNOSIS — M79673 Pain in unspecified foot: Secondary | ICD-10-CM | POA: Insufficient documentation

## 2021-04-23 DIAGNOSIS — M79672 Pain in left foot: Secondary | ICD-10-CM | POA: Diagnosis not present

## 2021-04-23 NOTE — Progress Notes (Signed)
Patient name: [name] MRN: [id number] DOB: 1966-05-21 Sex: male  REASON FOR CONSULT: Evaluate decreased perfusion to bilateral lower extremities  HPI: [name] is a 55 y.o. male, with history of diabetes, hypertension, COPD, CHF, previous CVA, tobacco abuse that presents for evaluation of decreased perfusion to bilateral lower extremities.  He is currently a resident in [place] and has had a previous stroke with some right-sided weakness.  He spends most of his time in a wheelchair but is able to stand and pivot.  States has had pain in both feet for years.  He has had bilateral great toe amputations that have healed.  He does smoke.  Past Medical History:  Diagnosis Date  . Asthma   . Bipolar 1 disorder (HCC)   . CHF (congestive heart failure) (HCC)   . COPD (chronic obstructive pulmonary disease) (HCC)   . Depression   . Diabetes mellitus without complication (HCC)   . GERD (gastroesophageal reflux disease)   . Homelessness   . Hypertension   . QT prolongation   . Schizoaffective disorder [facility])     Past Surgical History:  Procedure Laterality Date  . 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 IR ANGIO INTRA EXTRACRAN SEL COM CAROTID INNOMINATE BILAT MOD SED  12/17/2020  . IR ANGIO VERTEBRAL SEL VERTEBRAL BILAT MOD SED  12/17/2020  . RADIOLOGY WITH ANESTHESIA N/A 12/17/2020   Procedure: IR WITH ANESTHESIA;  Surgeon: Radiologist, Medication, MD;  Location: [facility] OR;  Service: Radiology;  Laterality: N/A;    Family History  Problem Relation Age of Onset  . Drug abuse Mother   . Suicidality Mother     SOCIAL HISTORY: Social History   Socioeconomic History  . Marital status: Single    Spouse name: Not on file  . Number of children: 4  . Years of education: Not on file  . Highest education level: Not on  file  Occupational History  . Occupation: N/A  Tobacco Use  . Smoking status: Current Every Day Smoker    Years: 30.00    Types: Cigarettes  . Smokeless tobacco: Never Used  Vaping Use  . Vaping Use: Never used  Substance and Sexual Activity  . Alcohol use: Yes    Comment: infrequently  . Drug use: Yes    Types: Cocaine, Marijuana, "Crack" cocaine  . Sexual activity: Not Currently    Partners: Female    Birth control/protection: None  Other Topics Concern  . Not on file  Social History Narrative   ** Merged History Encounter **       Social Determinants of Health   Financial Resource Strain: Not on file  Food Insecurity: Not on file  Transportation Needs: Not on file  Physical Activity: Not on file  Stress: Not on file  Social Connections: Not on file  Intimate Partner Violence: Not on file    Allergies  Allergen Reactions  . Ibuprofen Anaphylaxis  . Acetaminophen Hives, Nausea And Vomiting and Rash    Pt overdosed on bottle of APAP 05/06/16   . Latex Other (See Comments)    Pt denies allergy to latex 03/02/2021  . Lyrica Cr [Pregabalin Er] Other (See Comments)    Dystonia and dysarthria  . Peanut-Containing Drug Products Other (See Comments)    Pt denies allergy to peanut butter 03/02/2021  Current Outpatient Medications  Medication Sig Dispense Refill  . albuterol (VENTOLIN HFA) 108 (90 Base) MCG/ACT inhaler Inhale 2 puffs into the lungs every 6 (six) hours as needed for wheezing or shortness of breath. 8 g 2  . asenapine (SAPHRIS) 5 MG SUBL 24 hr tablet Place 1 tablet (5 mg total) under the tongue 2 (two) times daily as needed (Aggression). 20 tablet 0  . aspirin 81 MG chewable tablet Chew 1 tablet (81 mg total) by mouth daily. 30 tablet 0  . atorvastatin (LIPITOR) 40 MG tablet TAKE 1 TABLET (40 MG TOTAL) BY MOUTH DAILY. (Patient taking differently: Take 40 mg by mouth daily.) 30 tablet 0  . benzonatate (TESSALON) 100 MG capsule Take 1 capsule (100 mg total) by  mouth 3 (three) times daily as needed for cough. 20 capsule 0  . carvedilol (COREG) 3.125 MG tablet Take 1 tablet (3.125 mg total) by mouth 2 (two) times daily with a meal. 60 tablet 0  . divalproex (DEPAKOTE ER) 500 MG 24 hr tablet Take 1 tablet (500 mg total) by mouth in the morning and at bedtime. 60 tablet 0  . furosemide (LASIX) 40 MG tablet Take 1 tablet (40 mg total) by mouth daily. 30 tablet 0  . gabapentin (NEURONTIN) 300 MG capsule Take 2 capsules (600 mg total) by mouth 3 (three) times daily. 90 capsule 0  . guaiFENesin-dextromethorphan (ROBITUSSIN DM) 100-10 MG/5ML syrup Take 5 mLs by mouth every 6 (six) hours as needed for cough. 118 mL 0  . hydrALAZINE (APRESOLINE) 25 MG tablet Take 1 tablet (25 mg total) by mouth every 8 (eight) hours. 90 tablet 0  . isosorbide mononitrate (IMDUR) 30 MG 24 hr tablet Take 0.5 tablets (15 mg total) by mouth daily. 15 tablet 0  . levETIRAcetam (KEPPRA) 500 MG tablet Take 1 tablet (500 mg total) by mouth 2 (two) times daily. 60 tablet 0  . magnesium oxide (MAG-OX) 400 (241.3 Mg) MG tablet Take 1 tablet (400 mg total) by mouth 2 (two) times daily. 60 tablet 0  . potassium chloride (KLOR-CON) 20 MEQ packet Take 20 mEq by mouth daily. 30 packet 0  . sacubitril-valsartan (ENTRESTO) 24-26 MG Take 1 tablet by mouth 2 (two) times daily. 60 tablet 0  . spironolactone (ALDACTONE) 25 MG tablet Take 0.5 tablets (12.5 mg total) by mouth daily. 30 tablet 6  . traZODone (DESYREL) 50 MG tablet Take 1 tablet (50 mg total) by mouth at bedtime. 30 tablet 2  . umeclidinium bromide (INCRUSE ELLIPTA) 62.5 MCG/INH AEPB Inhale 1 puff into the lungs daily. 30 each 2   No current facility-administered medications for this visit.    REVIEW OF SYSTEMS:  [X]  denotes positive finding, [ ]  denotes negative finding Cardiac  Comments:  Chest pain or chest pressure:    Shortness of breath upon exertion:    Short of breath when lying flat:    Irregular heart rhythm:         Vascular    Pain in calf, thigh, or hip brought on by ambulation:    Pain in feet at night that wakes you up from your sleep:     Blood clot in your veins:    Leg swelling:         Pulmonary    Oxygen at home:    Productive cough:     Wheezing:         Neurologic    Sudden weakness in arms or legs:     Sudden numbness  in arms or legs:     Sudden onset of difficulty speaking or slurred speech:    Temporary loss of vision in one eye:     Problems with dizziness:         Gastrointestinal    Blood in stool:     Vomited blood:         Genitourinary    Burning when urinating:     Blood in urine:        Psychiatric    Major depression:         Hematologic    Bleeding problems:    Problems with blood clotting too easily:        Skin    Rashes or ulcers:        Constitutional    Fever or chills:      PHYSICAL EXAM: There were no vitals filed for this visit.  GENERAL: The patient is a well-nourished male, in no acute distress. The vital signs are documented above. CARDIAC: There is a regular rate and rhythm.  VASCULAR:  Bilateral femoral pulses palpable Bilateral PT pulses palpable at the ankle Bilateral great toe amps healed PULMONARY: No respiratory distress. ABDOMEN: Soft and non-tender. MUSCULOSKELETAL: There are no major deformities or cyanosis. SKIN: There are no ulcers or rashes noted. PSYCHIATRIC: The patient has a normal affect.  DATA:   ABIs today are 1.01 on the right triphasic and 1.02 on the left biphasic with no evidence of significant lower extremity arterial disease.  Assessment/Plan:  55 year old male presents for evaluation of decreased perfusion to the bilateral lower extremities.  As I discussed with him today I do not think he has any evidence of significant PAD.  He has normal ABIs bilaterally and he has palpable posterior tibial pulses at the ankles.  In addition, he has been able to heal bilateral great toe amputations which is also  suggestive of adequate inflow.  Discussed that I do not think blood flow is the etiology for his foot pain.  He asked about getting his toenails trimmed today and discussed that we would be happy to refer him back to podiatry for evaluation.  He can follow-up PRN.   Cephus Shelling, MD [facility] Office: [phone]

## 2021-05-03 ENCOUNTER — Other Ambulatory Visit: Payer: Self-pay

## 2021-05-03 ENCOUNTER — Ambulatory Visit: Payer: Medicaid Other | Admitting: Podiatry

## 2021-05-22 ENCOUNTER — Ambulatory Visit: Payer: Medicaid Other | Admitting: Podiatry

## 2021-07-03 ENCOUNTER — Emergency Department (HOSPITAL_COMMUNITY)
Admission: EM | Admit: 2021-07-03 | Discharge: 2021-07-04 | Disposition: A | Discharge status: Home / Self Care | Payer: Medicaid Other | Attending: Emergency Medicine | Admitting: Emergency Medicine

## 2021-07-03 DIAGNOSIS — Z79899 Other long term (current) drug therapy: Secondary | ICD-10-CM | POA: Insufficient documentation

## 2021-07-03 DIAGNOSIS — F32A Depression, unspecified: Secondary | ICD-10-CM | POA: Diagnosis present

## 2021-07-03 DIAGNOSIS — F209 Schizophrenia, unspecified: Secondary | ICD-10-CM | POA: Insufficient documentation

## 2021-07-03 DIAGNOSIS — I2510 Atherosclerotic heart disease of native coronary artery without angina pectoris: Secondary | ICD-10-CM | POA: Diagnosis not present

## 2021-07-03 DIAGNOSIS — F17210 Nicotine dependence, cigarettes, uncomplicated: Secondary | ICD-10-CM | POA: Diagnosis not present

## 2021-07-03 DIAGNOSIS — I110 Hypertensive heart disease with heart failure: Secondary | ICD-10-CM | POA: Diagnosis not present

## 2021-07-03 DIAGNOSIS — J45909 Unspecified asthma, uncomplicated: Secondary | ICD-10-CM | POA: Insufficient documentation

## 2021-07-03 DIAGNOSIS — Z7982 Long term (current) use of aspirin: Secondary | ICD-10-CM | POA: Insufficient documentation

## 2021-07-03 DIAGNOSIS — F332 Major depressive disorder, recurrent severe without psychotic features: Secondary | ICD-10-CM | POA: Insufficient documentation

## 2021-07-03 DIAGNOSIS — E119 Type 2 diabetes mellitus without complications: Secondary | ICD-10-CM | POA: Diagnosis not present

## 2021-07-03 DIAGNOSIS — R45851 Suicidal ideations: Secondary | ICD-10-CM

## 2021-07-03 DIAGNOSIS — I5043 Acute on chronic combined systolic (congestive) and diastolic (congestive) heart failure: Secondary | ICD-10-CM | POA: Diagnosis not present

## 2021-07-03 DIAGNOSIS — Z91040 Latex allergy status: Secondary | ICD-10-CM | POA: Diagnosis not present

## 2021-07-03 DIAGNOSIS — Z20822 Contact with and (suspected) exposure to covid-19: Secondary | ICD-10-CM | POA: Insufficient documentation

## 2021-07-03 DIAGNOSIS — Z7951 Long term (current) use of inhaled steroids: Secondary | ICD-10-CM | POA: Diagnosis not present

## 2021-07-03 DIAGNOSIS — Z91010 Allergy to peanuts: Secondary | ICD-10-CM | POA: Insufficient documentation

## 2021-07-03 DIAGNOSIS — J449 Chronic obstructive pulmonary disease, unspecified: Secondary | ICD-10-CM | POA: Insufficient documentation

## 2021-07-03 DIAGNOSIS — F259 Schizoaffective disorder, unspecified: Secondary | ICD-10-CM | POA: Diagnosis not present

## 2021-07-03 DIAGNOSIS — R440 Auditory hallucinations: Secondary | ICD-10-CM

## 2021-07-03 LAB — RESP PANEL BY RT-PCR (FLU A&B, COVID) ARPGX2
Influenza A by PCR: NEGATIVE
Influenza B by PCR: NEGATIVE
SARS Coronavirus 2 by RT PCR: NEGATIVE

## 2021-07-03 LAB — CBC WITH DIFFERENTIAL/PLATELET
Abs Immature Granulocytes: 0.02 10*3/uL (ref 0.00–0.07)
Basophils Absolute: 0 10*3/uL (ref 0.0–0.1)
Basophils Relative: 0 %
Eosinophils Absolute: 0.1 10*3/uL (ref 0.0–0.5)
Eosinophils Relative: 1 %
HCT: 44.3 % (ref 39.0–52.0)
Hemoglobin: 15.4 g/dL (ref 13.0–17.0)
Immature Granulocytes: 0 %
Lymphocytes Relative: 28 %
Lymphs Abs: 2.4 10*3/uL (ref 0.7–4.0)
MCH: 32 pg (ref 26.0–34.0)
MCHC: 34.8 g/dL (ref 30.0–36.0)
MCV: 91.9 fL (ref 80.0–100.0)
Monocytes Absolute: 0.7 10*3/uL (ref 0.1–1.0)
Monocytes Relative: 8 %
Neutro Abs: 5.3 10*3/uL (ref 1.7–7.7)
Neutrophils Relative %: 63 %
Platelets: 196 10*3/uL (ref 150–400)
RBC: 4.82 MIL/uL (ref 4.22–5.81)
RDW: 13.8 % (ref 11.5–15.5)
WBC: 8.4 10*3/uL (ref 4.0–10.5)
nRBC: 0 % (ref 0.0–0.2)

## 2021-07-03 LAB — COMPREHENSIVE METABOLIC PANEL
ALT: 16 U/L (ref 0–44)
AST: 18 U/L (ref 15–41)
Albumin: 4.3 g/dL (ref 3.5–5.0)
Alkaline Phosphatase: 68 U/L (ref 38–126)
Anion gap: 9 (ref 5–15)
BUN: 19 mg/dL (ref 6–20)
CO2: 25 mmol/L (ref 22–32)
Calcium: 9.6 mg/dL (ref 8.9–10.3)
Chloride: 105 mmol/L (ref 98–111)
Creatinine, Ser: 0.92 mg/dL (ref 0.61–1.24)
GFR, Estimated: >60 mL/min (ref >60)
Glucose, Bld: 85 mg/dL (ref 70–99)
Potassium: 4.2 mmol/L (ref 3.5–5.1)
Sodium: 139 mmol/L (ref 135–145)
Total Bilirubin: 1.3 mg/dL — ABNORMAL HIGH (ref 0.3–1.2)
Total Protein: 8 g/dL (ref 6.5–8.1)

## 2021-07-03 LAB — RAPID URINE DRUG SCREEN, HOSP PERFORMED
Amphetamines: NOT DETECTED
Barbiturates: NOT DETECTED
Benzodiazepines: NOT DETECTED
Cocaine: NOT DETECTED
Opiates: NOT DETECTED
Tetrahydrocannabinol: NOT DETECTED

## 2021-07-03 LAB — MAGNESIUM: Magnesium: 2.1 mg/dL (ref 1.7–2.4)

## 2021-07-03 MED ORDER — DIVALPROEX SODIUM ER 500 MG PO TB24: 500.0000 mg | ORAL_TABLET | Freq: Two times a day (BID) | ORAL | Status: DC

## 2021-07-03 MED ORDER — SPIRONOLACTONE 12.5 MG HALF TABLET
12.5000 mg | ORAL_TABLET | Freq: Every day | ORAL | Status: DC
  Filled 2021-07-03: qty 1

## 2021-07-03 MED ORDER — GABAPENTIN 300 MG PO CAPS
600.0000 mg | ORAL_CAPSULE | Freq: Three times a day (TID) | ORAL | Status: DC
  Administered 2021-07-03: 600 mg via ORAL
  Filled 2021-07-03: qty 2

## 2021-07-03 MED ORDER — ASPIRIN 81 MG PO CHEW
81.0000 mg | CHEWABLE_TABLET | Freq: Every day | ORAL | Status: DC
  Administered 2021-07-03: 81 mg via ORAL
  Filled 2021-07-03: qty 1

## 2021-07-03 MED ORDER — SACUBITRIL-VALSARTAN 24-26 MG PO TABS
1.0000 | ORAL_TABLET | Freq: Two times a day (BID) | ORAL | Status: DC
  Filled 2021-07-03 (×3): qty 1

## 2021-07-03 MED ORDER — LEVETIRACETAM 500 MG PO TABS: 500.0000 mg | ORAL_TABLET | Freq: Two times a day (BID) | ORAL | Status: DC

## 2021-07-03 MED ORDER — UMECLIDINIUM BROMIDE 62.5 MCG/INH IN AEPB
1.0000 | INHALATION_SPRAY | Freq: Every day | RESPIRATORY_TRACT | Status: DC
  Filled 2021-07-03: qty 7

## 2021-07-03 MED ORDER — TRAZODONE HCL 100 MG PO TABS: 50.0000 mg | ORAL_TABLET | Freq: Every day | ORAL | Status: DC

## 2021-07-03 MED ORDER — ALBUTEROL SULFATE HFA 108 (90 BASE) MCG/ACT IN AERS: 2.0000 | INHALATION_SPRAY | Freq: Four times a day (QID) | RESPIRATORY_TRACT | Status: DC | PRN

## 2021-07-03 MED ORDER — MAGNESIUM OXIDE -MG SUPPLEMENT 400 (240 MG) MG PO TABS
400.0000 mg | ORAL_TABLET | Freq: Two times a day (BID) | ORAL | Status: DC
  Filled 2021-07-03 (×2): qty 1

## 2021-07-03 MED ORDER — FUROSEMIDE 40 MG PO TABS
40.0000 mg | ORAL_TABLET | Freq: Every day | ORAL | Status: DC
  Administered 2021-07-03: 40 mg via ORAL
  Filled 2021-07-03: qty 1

## 2021-07-03 MED ORDER — CARVEDILOL 3.125 MG PO TABS
3.1250 mg | ORAL_TABLET | Freq: Two times a day (BID) | ORAL | Status: DC
  Administered 2021-07-03: 3.125 mg via ORAL
  Filled 2021-07-03: qty 1

## 2021-07-03 NOTE — ED Notes (Signed)
Tried to encourage patient to take his night medications, patient is uncooperative and off putting in his demeanor. Patient states "I do not want it, I am not taking any of it".

## 2021-07-03 NOTE — ED Notes (Addendum)
Pt refused to get an EKG 

## 2021-07-03 NOTE — BH Assessment (Addendum)
BHH Assessment Progress Note   At the request of Dorena Bodo, NP, this writer called pt's SNF, [place] [phone]), to request MAR.  Call was placed at 13:35 and I spoke to [place].  She agrees to fax document to me at [phone].  Receipt is pending as of this writing.  Clydie Braun has been notified.  Doylene Canning, [name] [facility] Coordinator [phone]   Addendum:  Records have been faxed to me and I have shared them with Clydie Braun.  I will place them in pt's paper chart rack.  Doylene Canning, [name] [facility] Coordinator [phone]

## 2021-07-03 NOTE — Progress Notes (Signed)
CSW spoke with pt as pt requested to see a SW. Pt reported he has been living at [facility] for the past 3 1/2 months. Pt stated he does not like living there, because "that place does not help me". Pt stated he would like to go to a [facility] [place], in [place] , or go somewhere in [place]. Pt stated he does not want to go back to [place], and "[place] instead of [place]".  CSW spoke with [place] who reported pt initial came to facility for rehab and ended staying longer. Facility reported d/c pt a few weeks ago but helping pt find placement up until pt started to make SI comments and sent to ED. The facility stated they found pt a group home, but due to the home not being wheel chair accessible they denied pt. Pt is currently waiting for TTS consult . CSW continue to follow.    Valentina Shaggy.Chandria Rookstool, MSW, LCSWA [facility]  Transitions of Care Clinical Social Worker I Direct Dial: [phone]  Fax: [phone] [email or web address]

## 2021-07-03 NOTE — ED Notes (Signed)
Pt, always has brought a bag with miscellaneous items

## 2021-07-03 NOTE — ED Notes (Signed)
Sitter notified this Clinical research associate that when she removed pt's dinner tray, pt tried to keep the knife and became agitated when she attempted to take it away. Sitter was able to removed knife from pt's possession. Pt currently resting in his bed quietly.

## 2021-07-03 NOTE — ED Triage Notes (Signed)
Ems brings pt in from [place] for suicidal thoughts. Pt states he has had these thoughts for about one month.

## 2021-07-03 NOTE — BH Assessment (Addendum)
Comprehensive Clinical Assessment (CCA) Note  07/03/2021 [name] [id number]  Disposition: TTS assessment completed. Discussed clinical details of patient's presentation with the the [facility] provider Dorena Bodo, NP). Per provider, disposition is restart pysch meds and overnight observation with psych, re-eval inmorning for possible discharge back to SNF. Social Work consult ordered to address patient's request to move to a facility in [place].  COLUMB[place]-SUICIDE SEVERITY RATING SCALE (C-SSRS) completed and patient scored "High Risk". Therefore, 1-1 sitter precations recommended. Nursing and and EDP provided updates.    The patient demonstrates the following risk factors for suicide: Chronic risk factors for suicide include: psychiatric disorder of Schizoaffective Disorder . Acute risk factors for suicide include:  housing  . Protective factors for this patient include: hope for the future and hopes to move to [address] where patient feels he has more resources available to meet his needs . Considering these factors, the overall suicide risk at this point appears to be high. Patient is appropriate for outpatient follow up.   Flowsheet Row ED from 07/03/2021 in [facility] ED to Hosp-Admission (Discharged) from 03/02/2021 in [facility]LO[facility] ED from 02/19/2021 in [facility]  C-SSRS RISK CATEGORY High Risk Error: Q3, 4, or 5 should not be populated when Q2 is No Error: Q3, 4, or 5 should not be populated when Q2 is No        Chief Complaint:  Chief Complaint  Patient presents with   Psychiatric Evaluation   Suicidal   Visit Diagnosis: Schizoaffective Disorder and Major Depressive Disorder, Recurrent, Severe, without Psychotic Features  [name] is 55 yrs old stating that he has thoughts to kill himself. Onset 1.5 months ago. He has a plan to overdose. He does not have access to medications  informing this Clinician that he lives at nursing home w/ no access to means.  Patient with a history of suicide attempts in the past, as he reports "several times". He does not recall the date of his last suicide attempt. Stating, "You look at my records and figure it out". Patient believes that his  current suicidal ideations are triggered by "Not having a psychiatrist" and "Not having any medications".  Patient also stating that he doesn't want to return to [place], he has resided at the facility for 3.5 months. Patient admitting that [place] is the most significant trigger triggering his suicidal ideations. He is requesting to see a Child psychotherapist in [place] stating, "I need Social Work to find me housing in [place]". States that if he finds a place in [place] he will have more resources and "better care". Further explains, "Everything is better, the psychiatrist, the hospitals, and I get more money with my disability checks, it's just a better situation". Patient says, "If you send me back to [place], I'm going to continue saying I'm suicidal". Patient lives at [facility] as the result of having a stroke as he reports "sometime this year". Also, "Both of my toes were cut off". Patient reporting that he needs assistance with most of his ADL's.   Tollie denies as a history self mutilating behaviors. Patient stating that his mother passed away as the result of a suicide attempt (past event). Current depressive symptoms: hopelessness, isolating self from others, tearful, and anger/irritability. He reports no sleep disturbances. Appetite is good stating that [place] gives him "double portions". He does not have a support system. He supports himself with a disability  check. Completed the 12th grade. No hx of abuse and/or trauma.   Patient denies [place]. No history of aggressive and/or assaultive behaviors. No legal issues. He reports hearing voices telling him to "Kill  myself". The auditory hallucinations started 1 month ago. He does not appear delusional. He does not appear to be responding to internal stimuli. Denies history of substance use.   CCA Screening, Triage and Referral (STR)  Patient Reported Information How did you hear about [place]? Other (Comment) ([employer])  What Is the Reason for Your Visit/Call Today? 55 year old male with a history of asthma, COPD, depression, DM 2, CHF, GERD, homelessness, HTN, schizoaffective disorder presenting to the emergency department via [facility] PD with suicidal ideation with a plan and command auditory hallucinations.  The patient states that he is supposed to be on psychiatric medications but has not been taking any medication recently.  He endorses command auditory hallucinations telling him to harm himself.  He has had thoughts of suicide for the past month with a plan to overdose on home medications. He denies any medical complaints.  How Long Has This Been Causing You Problems? <Week  What Do You Feel Would Help You the Most Today? Medication(s); Transportation Assistance   Have You Recently Had Any Thoughts About Hurting Yourself? Yes  Are You Planning to Commit Suicide/Harm Yourself At This time? Yes   Have you Recently Had Thoughts About Hurting Someone Karolee Ohs? Yes  Are You Planning to Harm Someone at This Time? No  Explanation: No data recorded  Have You Used Any Alcohol or Drugs in the Past 24 Hours? No  How Long Ago Did You Use Drugs or Alcohol? No data recorded What Did You Use and How Much? No data recorded  Do You Currently Have a Therapist/Psychiatrist? No  Name of Therapist/Psychiatrist: No data recorded  Have You Been Recently Discharged From Any Office Practice or Programs? No  Explanation of Discharge From Practice/Program: No data recorded    CCA Screening Triage Referral Assessment Type of Contact: Tele-Assessment  Telemedicine Service Delivery:   Is this Initial or Reassessment?  Initial Assessment  Date Telepsych consult ordered in [facility]:  07/03/21  Time Telepsych consult ordered in [facility]:  2346  Location of Assessment: [facility] ED  Provider Location: [facility] Assessment Services   Collateral Involvement: Pt declined to provide verbal consent for clinician to make contact with friends/family for colalteral information   Does Patient Have a Court Appointed Legal Guardian? No data recorded Name and Contact of Legal Guardian: No data recorded If Minor and Not Living with Parent(s), Who has Custody? N/A  Is CPS involved or ever been involved? Never  Is APS involved or ever been involved? Never   Patient Determined To Be At Risk for Harm To Self or Others Based on Review of Patient Reported Information or Presenting Complaint? Yes, for Self-Harm  Method: No data recorded Availability of Means: No data recorded Intent: No data recorded Notification Required: No data recorded Additional Information for Danger to Others Potential: No data recorded Additional Comments for Danger to Others Potential: No data recorded Are There Guns or Other Weapons in Your Home? No data recorded Types of Guns/Weapons: No data recorded Are These Weapons Safely Secured?                            No data recorded Who Could Verify You Are Able To Have These Secured: No data recorded Do You Have any Outstanding  Charges, Pending Court Dates, Parole/Probation? No data recorded Contacted To Inform of Risk of Harm To Self or Others: Law Enforcement Mudlogger is informed of pt's plan to kill himself via getting hit by a car)    Does Patient Present under Involuntary Commitment? Yes  IVC Papers Initial File Date: 07/03/21   [place] of Residence: [place]   Patient Currently Receiving the Following Services: -- (Not receiving any services at this time.)   Determination of Need: Emergent (2 hours)   Options For Referral: Outpatient Therapy; Medication Management (ACTT  services)     CCA Biopsychosocial Patient Reported Schizophrenia/Schizoaffective Diagnosis in Past: No data recorded  Strengths: UTA   Mental Health Symptoms Depression:   Difficulty Concentrating; Irritability; Sleep (too much or little)   Duration of Depressive symptoms:    Mania:   None   Anxiety:    None   Psychosis:   None   Duration of Psychotic symptoms:    Trauma:   None   Obsessions:   N/A   Compulsions:   N/A   Inattention:   N/A   Hyperactivity/Impulsivity:   N/A   Oppositional/Defiant Behaviors:   None   Emotional Irregularity:   N/A   Other Mood/Personality Symptoms:   None    Mental Status Exam Appearance and self-care  Stature:   Average   Weight:   Average weight   Clothing:   Age-appropriate   Grooming:   Normal   Cosmetic use:   None   Posture/gait:   Normal   Motor activity:   Not Remarkable   Sensorium  Attention:   Normal   Concentration:   Normal   Orientation:   X5   Recall/memory:   Normal   Affect and Mood  Affect:   Depressed   Mood:   Depressed   Relating  Eye contact:   None   Facial expression:   Responsive   Attitude toward examiner:   Defensive; Guarded   Thought and Language  Speech flow:  Clear and Coherent   Thought content:   Appropriate to Mood and Circumstances   Preoccupation:   None   Hallucinations:   None   Organization:  No data recorded  [employer] of Knowledge:   Fair   Intelligence:   Average   Abstraction:   Normal   Judgement:   Fair   Dance movement psychotherapist:   Adequate   Insight:   Fair   Decision Making:   Normal   Social Functioning  Social Maturity:   Impulsive   Social Judgement:   Heedless   Stress  Stressors:   Other (Comment)   Coping Ability:   Normal   Skill Deficits:   None   Supports:   Friends/Service system     Religion: Religion/Spirituality Are You A Religious Person?:  No  Leisure/Recreation: Leisure / Recreation Do You Have Hobbies?: No  Exercise/Diet: Exercise/Diet Do You Exercise?: No Have You Gained or Lost A Significant Amount of Weight in the Past Six Months?: No Do You Follow a Special Diet?: No Do You Have Any Trouble Sleeping?: No   CCA Employment/Education Employment/Work Situation: Employment / Work Situation Has Patient ever Been in Equities trader?: No  Education: Education Is Patient Currently Attending School?: No Did Theme park manager?: No Did You Have An Individualized Education Program (IIEP): No Did You Have Any Difficulty At [employer]?: No Patient's Education Has Been Impacted by Current Illness: No   CCA Family/Childhood History Family and Relationship History:  Family history Marital status:  (unkown) Does patient have children?:  (unknown)  Childhood History:  Childhood History By whom was/is the patient raised?:  (unknown)  Child/Adolescent Assessment:     CCA Substance Use Alcohol/Drug Use: Alcohol / Drug Use Pain Medications: See MAR Prescriptions: See MAR Over the Counter: See MAR History of alcohol / drug use?: No history of alcohol / drug abuse Longest period of sobriety (when/how long): unknown                         ASAM's:  Six Dimensions of Multidimensional Assessment  Dimension 1:  Acute Intoxication and/or Withdrawal Potential:      Dimension 2:  Biomedical Conditions and Complications:      Dimension 3:  Emotional, Behavioral, or Cognitive Conditions and Complications:     Dimension 4:  Readiness to Change:     Dimension 5:  Relapse, Continued use, or Continued Problem Potential:     Dimension 6:  Recovery/Living Environment:     ASAM Severity Score:    ASAM Recommended Level of Treatment:     Substance use Disorder (SUD)    Recommendations for Services/Supports/Treatments:    Discharge Disposition:    DSM5 Diagnoses: Patient Active Problem List   Diagnosis Date  Noted   Foot pain 04/23/2021   Acute on chronic combined systolic (congestive) and diastolic (congestive) heart failure (HCC) 03/02/2021   History of stroke 03/02/2021   Substance use disorder 03/02/2021   Skin ulcer of right great toe with necrosis of bone (HCC)    Encounter for assessment of healthcare decision-making capacity    Pain, dental    Dystonia    Dysarthria    Shock liver 01/04/2021   Acute metabolic encephalopathy 01/04/2021   CAD (coronary artery disease) 01/04/2021   HTN (hypertension) 01/04/2021   Bipolar I disorder (HCC) 01/04/2021   Cocaine abuse (HCC) 01/04/2021   Homelessness 01/04/2021   HLD (hyperlipidemia) 01/04/2021   Osteomyelitis of great toe (HCC)    Ulcer of great toe, left, with necrosis of bone (HCC)    Ulcer of great toe, right, with necrosis of muscle (HCC)    Acute osteomyelitis of toe, left (HCC)    Acute on chronic combined systolic and diastolic CHF (congestive heart failure) (HCC) 12/20/2020   Respiratory failure (HCC)    Ischemic stroke (HCC) 12/17/2020   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   Diabetic foot ulcer (HCC) 12/08/2020   CHF (congestive heart failure) (HCC) 11/14/2020   CHF exacerbation (HCC) 11/13/2020   Hypokalemia    Hyperlipidemia    Acute respiratory failure with hypoxia (HCC) 10/26/2020   COPD with acute exacerbation (HCC) 10/14/2020   Acute on chronic combined systolic and diastolic CHF (congestive heart failure) (HCC) 10/02/2020   Near syncope 07/21/2020   Schizoaffective disorder (HCC) 06/24/2019   SOB (shortness of breath)    QT prolongation 05/25/2019   Cocaine abuse (HCC)    Elevated troponin    Homeless    Suicidal ideation    Bilateral foot pain 04/05/2019   Housing situation unstable 03/28/2019   Bilateral lower extremity edema    Asthma 12/26/2016   Tobacco use disorder 12/26/2016   Schizophrenia (HCC) 12/26/2016   Cocaine abuse with cocaine-induced mood disorder (HCC) 09/06/2016   Alcohol use disorder,  mild, abuse 07/31/2016   Substance induced mood disorder (HCC) 07/30/2016   Schizoaffective disorder, bipolar type (HCC) 05/31/2016   Cocaine use disorder, severe, dependence (HCC) 05/31/2016   Hypertension 05/31/2016  Referrals to Alternative Service(s): Referred to Alternative Service(s):   Place:   Date:   Time:    Referred to Alternative Service(s):   Place:   Date:   Time:    Referred to Alternative Service(s):   Place:   Date:   Time:    Referred to Alternative Service(s):   Place:   Date:   Time:     Waldon Merl, Counselor

## 2021-07-03 NOTE — ED Notes (Signed)
Pt refused v/s update.

## 2021-07-03 NOTE — BH Assessment (Addendum)
@  12 noon, Clinician requested nursing Lynnea Ferrier, RN) via secure chat to set up TTS machine for patient's initial assessment.   @1235 , Clinician sent a follow up secure chat to patient's nurse , RN) and charge nurse Anne Ng, RN) inquiring if patient is available to be seen by TTS via tele assessment. TTS will continue to follow up.

## 2021-07-03 NOTE — ED Provider Notes (Signed)
[facility]-EMERGENCY DEPT Provider Note   CSN: [id number] Arrival date & time: 07/03/21  1118     History No chief complaint on file.   [name] is a 55 y.o. male.  HPI  55 year old male with a history of asthma, COPD, depression, DM 2, CHF, GERD, homelessness, HTN, schizoaffective disorder presenting to the emergency department via [facility] with suicidal ideation with a plan and command auditory hallucinations.  The patient states that he is supposed to be on psychiatric medications but has not been taking any medication recently.  He endorses command auditory hallucinations telling him to harm himself.  He has had thoughts of suicide for the past month with a plan to overdose on home medications. He denies any medical complaints.  Past Medical History:  Diagnosis Date   Asthma    Bipolar 1 disorder (HCC)    CHF (congestive heart failure) (HCC)    COPD (chronic obstructive pulmonary disease) (HCC)    Depression    Diabetes mellitus without complication (HCC)    GERD (gastroesophageal reflux disease)    Homelessness    Hypertension    QT prolongation    Schizoaffective disorder (HCC)     Patient Active Problem List   Diagnosis Date Noted   Foot pain 04/23/2021   Acute on chronic combined systolic (congestive) and diastolic (congestive) heart failure (HCC) 03/02/2021   History of stroke 03/02/2021   Substance use disorder 03/02/2021   Skin ulcer of right great toe with necrosis of bone (HCC)    Encounter for assessment of healthcare decision-making capacity    Pain, dental    Dystonia    Dysarthria    Shock liver 01/04/2021   Acute metabolic encephalopathy 01/04/2021   CAD (coronary artery disease) 01/04/2021   HTN (hypertension) 01/04/2021   Bipolar I disorder (HCC) 01/04/2021   Cocaine abuse (HCC) 01/04/2021   Homelessness 01/04/2021   HLD (hyperlipidemia) 01/04/2021   Osteomyelitis of great toe (HCC)    Ulcer of great toe, left,  with necrosis of bone (HCC)    Ulcer of great toe, right, with necrosis of muscle (HCC)    Acute osteomyelitis of toe, left (HCC)    Acute on chronic combined systolic and diastolic CHF (congestive heart failure) (HCC) 12/20/2020   Respiratory failure (HCC)    Ischemic stroke (HCC) 12/17/2020   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   Diabetic foot ulcer (HCC) 12/08/2020   CHF (congestive heart failure) (HCC) 11/14/2020   CHF exacerbation (HCC) 11/13/2020   Hypokalemia    Hyperlipidemia    Acute respiratory failure with hypoxia (HCC) 10/26/2020   COPD with acute exacerbation (HCC) 10/14/2020   Acute on chronic combined systolic and diastolic CHF (congestive heart failure) (HCC) 10/02/2020   Near syncope 07/21/2020   Schizoaffective disorder (HCC) 06/24/2019   SOB (shortness of breath)    QT prolongation 05/25/2019   Cocaine abuse (HCC)    Elevated troponin    Homeless    Suicidal ideation    Bilateral foot pain 04/05/2019   Housing situation unstable 03/28/2019   Bilateral lower extremity edema    Asthma 12/26/2016   Tobacco use disorder 12/26/2016   Schizophrenia (HCC) 12/26/2016   Cocaine abuse with cocaine-induced mood disorder (HCC) 09/06/2016   Alcohol use disorder, mild, abuse 07/31/2016   Substance induced mood disorder (HCC) 07/30/2016   Schizoaffective disorder, bipolar type (HCC) 05/31/2016   Cocaine use disorder, severe, dependence (HCC) 05/31/2016   Hypertension 05/31/2016    Past Surgical History:  Procedure  Laterality Date   AMPUTATION TOE Bilateral 01/02/2021   Procedure: AMPUTATION TOE;  Surgeon: Park Liter, DPM;  Location: [facility] OR;  Service: Podiatry;  Laterality: Bilateral;  Ok to leave patient on hospital bed   IR ANGIO INTRA EXTRACRAN SEL COM CAROTID INNOMINATE BILAT MOD SED  12/17/2020   IR ANGIO VERTEBRAL SEL VERTEBRAL BILAT MOD SED  12/17/2020   RADIOLOGY WITH ANESTHESIA N/A 12/17/2020   Procedure: IR WITH ANESTHESIA;  Surgeon: Radiologist, Medication, MD;   Location: [facility] OR;  Service: Radiology;  Laterality: N/A;       Family History  Problem Relation Age of Onset   Drug abuse Mother    Suicidality Mother     Social History   Tobacco Use   Smoking status: Every Day    Years: 30.00    Types: Cigarettes   Smokeless tobacco: Never  Vaping Use   Vaping Use: Never used  Substance Use Topics   Alcohol use: Yes    Comment: infrequently   Drug use: Yes    Types: Cocaine, Marijuana, "Crack" cocaine    Home Medications Prior to Admission medications   Medication Sig Start Date End Date Taking? Authorizing Provider  albuterol (VENTOLIN HFA) 108 (90 Base) MCG/ACT inhaler Inhale 2 puffs into the lungs every 6 (six) hours as needed for wheezing or shortness of breath. 11/16/20   Pokhrel, Rebekah Chesterfield, MD  asenapine (SAPHRIS) 5 MG SUBL 24 hr tablet Place 1 tablet (5 mg total) under the tongue 2 (two) times daily as needed (Aggression). 03/12/21   Jerald Kief, MD  aspirin 81 MG chewable tablet Chew 1 tablet (81 mg total) by mouth daily. 02/03/21   Leatha Gilding, MD  atorvastatin (LIPITOR) 40 MG tablet TAKE 1 TABLET (40 MG TOTAL) BY MOUTH DAILY. Patient taking differently: Take 40 mg by mouth daily. 12/10/20 12/10/21  Jackelyn Poling, DO  benzonatate (TESSALON) 100 MG capsule Take 1 capsule (100 mg total) by mouth 3 (three) times daily as needed for cough. 03/12/21   Jerald Kief, MD  carvedilol (COREG) 3.125 MG tablet Take 1 tablet (3.125 mg total) by mouth 2 (two) times daily with a meal. 11/16/20   Pokhrel, Rebekah Chesterfield, MD  divalproex (DEPAKOTE ER) 500 MG 24 hr tablet Take 1 tablet (500 mg total) by mouth in the morning and at bedtime. 02/03/21   Leatha Gilding, MD  furosemide (LASIX) 40 MG tablet Take 1 tablet (40 mg total) by mouth daily. 02/03/21   Leatha Gilding, MD  gabapentin (NEURONTIN) 300 MG capsule Take 2 capsules (600 mg total) by mouth 3 (three) times daily. 02/03/21   Leatha Gilding, MD  guaiFENesin-dextromethorphan (ROBITUSSIN DM)  100-10 MG/5ML syrup Take 5 mLs by mouth every 6 (six) hours as needed for cough. 03/12/21   Jerald Kief, MD  hydrALAZINE (APRESOLINE) 25 MG tablet Take 1 tablet (25 mg total) by mouth every 8 (eight) hours. 03/12/21 04/11/21  Jerald Kief, MD  isosorbide mononitrate (IMDUR) 30 MG 24 hr tablet Take 0.5 tablets (15 mg total) by mouth daily. 03/12/21 04/11/21  Jerald Kief, MD  levETIRAcetam (KEPPRA) 500 MG tablet Take 1 tablet (500 mg total) by mouth 2 (two) times daily. 02/03/21   Leatha Gilding, MD  magnesium oxide (MAG-OX) 400 (241.3 Mg) MG tablet Take 1 tablet (400 mg total) by mouth 2 (two) times daily. 02/03/21   Leatha Gilding, MD  potassium chloride (KLOR-CON) 20 MEQ packet Take 20 mEq by mouth daily. 03/13/21 04/12/21  Jerald Kief, MD  sacubitril-valsartan (ENTRESTO) 24-26 MG Take 1 tablet by mouth 2 (two) times daily. 02/03/21   Leatha Gilding, MD  spironolactone (ALDACTONE) 25 MG tablet Take 0.5 tablets (12.5 mg total) by mouth daily. 01/15/21   Russella Dar, NP  traZODone (DESYREL) 50 MG tablet Take 1 tablet (50 mg total) by mouth at bedtime. 11/16/20   Pokhrel, Rebekah Chesterfield, MD  umeclidinium bromide (INCRUSE ELLIPTA) 62.5 MCG/INH AEPB Inhale 1 puff into the lungs daily. 11/16/20   Pokhrel, Rebekah Chesterfield, MD    Allergies    Ibuprofen, Acetaminophen, Latex, Lyrica cr [pregabalin er], and Peanut-containing drug products  Review of Systems   Review of Systems  Constitutional:  Negative for chills and fever.  HENT:  Negative for ear pain and sore throat.   Eyes:  Negative for pain and visual disturbance.  Respiratory:  Negative for cough and shortness of breath.   Cardiovascular:  Negative for chest pain and palpitations.  Gastrointestinal:  Negative for abdominal pain and vomiting.  Genitourinary:  Negative for dysuria and hematuria.  Musculoskeletal:  Negative for arthralgias and back pain.  Skin:  Negative for color change and rash.  Neurological:  Negative for seizures and  syncope.  Psychiatric/Behavioral:  Positive for hallucinations and suicidal ideas.   All other systems reviewed and are negative.  Physical Exam Updated Vital Signs BP (!) 153/83 (BP Location: Left Arm)   Pulse 70   Temp 98.1 F (36.7 C) (Oral)   Resp 18   SpO2 98%   Physical Exam Vitals and nursing note reviewed.  Constitutional:      Appearance: He is well-developed.  HENT:     Head: Normocephalic and atraumatic.  Eyes:     Conjunctiva/sclera: Conjunctivae normal.  Cardiovascular:     Rate and Rhythm: Normal rate and regular rhythm.     Heart sounds: No murmur heard. Pulmonary:     Effort: Pulmonary effort is normal. No respiratory distress.     Breath sounds: Wheezing present.     Comments: Mild expiratory wheezing present Abdominal:     Palpations: Abdomen is soft.     Tenderness: There is no abdominal tenderness.  Musculoskeletal:     Cervical back: Neck supple.  Skin:    General: Skin is warm and dry.  Neurological:     Mental Status: He is alert.  Psychiatric:        Attention and Perception: He perceives auditory hallucinations.        Mood and Affect: Affect is labile.        Behavior: Behavior normal. Behavior is cooperative.        Thought Content: Thought content includes suicidal ideation. Thought content includes suicidal plan.    ED Results / Procedures / Treatments   Labs (all labs ordered are listed, but only abnormal results are displayed) Labs Reviewed  COMPREHENSIVE METABOLIC PANEL - Abnormal; Notable for the following components:      Result Value   Total Bilirubin 1.3 (*)    All other components within normal limits  RESP PANEL BY RT-PCR (FLU A&B, COVID) ARPGX2  RAPID URINE DRUG SCREEN, HOSP PERFORMED  CBC WITH DIFFERENTIAL/PLATELET  MAGNESIUM  CBG MONITORING, ED    EKG None  Radiology No results found.  Procedures Procedures   Medications Ordered in ED Medications  albuterol (VENTOLIN HFA) 108 (90 Base) MCG/ACT inhaler 2  puff (has no administration in time range)  aspirin chewable tablet 81 mg (has no administration in time range)  carvedilol (  COREG) tablet 3.125 mg (has no administration in time range)  divalproex (DEPAKOTE ER) 24 hr tablet 500 mg (has no administration in time range)  furosemide (LASIX) tablet 40 mg (has no administration in time range)  gabapentin (NEURONTIN) capsule 600 mg (has no administration in time range)  levETIRAcetam (KEPPRA) tablet 500 mg (has no administration in time range)  magnesium oxide (MAG-OX) tablet 400 mg (has no administration in time range)  sacubitril-valsartan (ENTRESTO) 24-26 mg per tablet (has no administration in time range)  spironolactone (ALDACTONE) tablet 12.5 mg (has no administration in time range)  traZODone (DESYREL) tablet 50 mg (has no administration in time range)  umeclidinium bromide (INCRUSE ELLIPTA) 62.5 MCG/INH 1 puff (has no administration in time range)    ED Course  I have reviewed the triage vital signs and the nursing notes.  Pertinent labs & imaging results that were available during my care of the patient were reviewed by me and considered in my medical decision making (see chart for details).    MDM Rules/Calculators/A&P                           55 year old male with a history of schizoaffective disorder not on any medications currently presenting to the emergency department suicidal ideation with a plan to overdose on home medications.  He additionally presents with decompensation in the setting of medication noncompliance with command auditory hallucinations telling him to harm himself.  Currently denies any medical complaints.  We will initiate medical screening with routine labs given his medical history of diabetes, CHF, COPD, hypertension, QT prolongation.  Of note, screening EKG was ordered which the patient subsequently refused to undergo on multiple attempts.  The patient is deemed to be medically cleared at this time.  TTS  consult placed.  Home medications reordered.  Final Clinical Impression(s) / ED Diagnoses Final diagnoses:  Auditory hallucinations  Suicidal thoughts    Rx / DC Orders ED Discharge Orders     None        Ernie Avena, MD 07/03/21 1425

## 2021-07-04 NOTE — ED Notes (Signed)
Patient given his 2 bags of belongings before discharge.

## 2021-07-04 NOTE — ED Notes (Signed)
Patient refusing vital signs. Patient states he will not be taking his morning medication as well.

## 2021-07-04 NOTE — Progress Notes (Addendum)
CSW spoke with pt , pt agreed to be transported back to [facility]. CSW informed pt the facility can assist him with finding another placement if he does not wish to live there. CSW contact safe transport for ride.   Adden 1:40pm CSW confirmed Safe transport ETA 2:15pm    Jaedyn Lard M.Wyoma Genson, MSW, LCSWA [facility]  Transitions of Care Clinical Social Worker I Direct Dial: [phone]  Fax: [phone] [email or web address]

## 2021-07-04 NOTE — ED Notes (Signed)
Pt resting in bed with eyes closed. Respirations even and unlabored.

## 2021-07-04 NOTE — ED Notes (Signed)
Pt screaming at nursing staff that he cant sleep. Medications were offered. He declined the medication. He then proceeded to yell that nursing staff were not doing anything and aren't taking care of any patients.

## 2021-07-04 NOTE — ED Notes (Signed)
Pt refusing vital sign update.

## 2021-07-04 NOTE — Consult Note (Signed)
[facility] ED Discharge  07/04/2021 2:28 PM [name]  MRN:  [id number]  Method of visit?: Face to Face   Principal Problem: Suicidal ideation Discharge Diagnoses: Principal Problem:   Suicidal ideation   Subjective:  per admit note:  [name] is 55 yrs old stating that he has thoughts to kill himself. Onset 1.5 months ago. He has a plan to overdose. He does not have access to medications informing this Clinician that he lives at nursing home w/ no access to means.   Patient with a history of suicide attempts in the past, as he reports "several times". He does not recall the date of his last suicide attempt. Stating, "You look at my records and figure it out". Patient believes that his  current suicidal ideations are triggered by "Not having a psychiatrist" and "Not having any medications".  Patient also stating that he doesn't want to return to [facility], he has resided at the facility for 3.5 months. Patient admitting that [facility] is the most significant trigger triggering his suicidal ideations. He is requesting to see a Child psychotherapistocial Worker [facility] stating, "I need Social Work to find me housing in [place]". States that if he finds a place in [place] he will have more resources and "better care". Further explains, "Everything is better, the psychiatrist, the hospitals, and I get more money with my disability checks, it's just a better situation". Patient says, "If you send me back to [place], I'm going to continue saying I'm suicidal". [facility] as the result of having a stroke as he reports "sometime this year". Also, "Both of my toes were cut off". Patient reporting that he needs assistance with most of his ADL's.   Today, [name] 55 y.o old is seen by this provider [facility]. Patient requested that this interview take place in a more private location than a hallway bed and while awaiting for a wheelchair, he started to  show me the [facility].   [place] [facility] [place], patient was hostile to this Clinical research associatewriter and reporting that he has been suicidal for over a month, reports he hasn't been on his medications or seen a psychiatrist since living at [facility] (he has been refusing medications while in the ED). He kept directing the interview to how unhappy he is and how he is  tired of being nice to people when they are not nice to him.   Speech is slurred secondary to history of stroke, he is angry and speaking loudly. Reports that he continues to be suicidal and will take a bunch of pills. When asked about not wanting to live [facility] (which he told TTS counselor yesterday) he became angry again and accused me of not listening to him and just reading what other people have put on the computer about him. He was hostile and kept directing the conversation to the things he wanted to discuss, such as him not getting double portion of regular food like he was ordered at the SNF. The interview ended and he was transported via his wheelchair back to his space in the ED.   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   Procedure: AMPUTATION TOE;  Surgeon: Park Liter, DPM;  Location: [facility] OR;  Service: Podiatry;  Laterality: Bilateral;  Ok to leave patient on hospital bed   IR ANGIO INTRA EXTRACRAN SEL COM CAROTID INNOMINATE BILAT MOD SED  12/17/2020   IR ANGIO VERTEBRAL SEL  VERTEBRAL BILAT MOD SED  12/17/2020   RADIOLOGY WITH ANESTHESIA N/A 12/17/2020   Procedure: IR WITH ANESTHESIA;  Surgeon: Radiologist, Medication, MD;  Location: [facility] OR;  Service: Radiology;  Laterality: N/A;   Family History:  Family History  Problem Relation Age of Onset   Drug abuse Mother    Suicidality Mother    Family Psychiatric  History:  Social History:  Social History   Substance and Sexual Activity  Alcohol Use Yes   Comment: infrequently     Social History   Substance and Sexual Activity  Drug Use Yes   Types: Cocaine, Marijuana, "Crack" cocaine    Social History   Socioeconomic History   Marital status: Single    Spouse name: Not on file   Number of children: 4   Years of education: Not on file   Highest education level: Not on file  Occupational History   Occupation: N/A  Tobacco Use   Smoking status: Every Day    Years: 30.00    Types: Cigarettes   Smokeless tobacco: Never  Vaping Use   Vaping Use: Never used  Substance and Sexual Activity   Alcohol use: Yes    Comment: infrequently   Drug use: Yes    Types: Cocaine, Marijuana, "Crack" cocaine   Sexual activity: Not Currently    Partners: Female    Birth control/protection: None  Other Topics Concern   Not on file  Social History Narrative   ** Merged History Encounter **       Social Determinants of Health   Financial Resource Strain: Not on file  Food Insecurity: Not on file  Transportation Needs: Not on file  Physical Activity: Not on file  Stress: Not on file  Social Connections: Not on file    Tobacco Cessation:  N/A, patient does not currently use tobacco products  Current Medications: Current Facility-Administered Medications  Medication Dose Route Frequency Provider Last Rate Last Admin   albuterol (VENTOLIN HFA) 108 (90 Base) MCG/ACT inhaler 2 puff  2 puff Inhalation Q6H PRN Ernie Avena, MD       aspirin chewable tablet 81 mg  81 mg Oral Daily Ernie Avena, MD   81 mg at  07/03/21 1519   carvedilol (COREG) tablet 3.125 mg  3.125 mg Oral BID WC Ernie Avena, MD   3.125 mg at 07/03/21 1808   divalproex (DEPAKOTE ER) 24 hr tablet 500 mg  500 mg Oral BID Ernie Avena, MD       furosemide (LASIX) tablet 40 mg  40 mg Oral Daily Ernie Avena, MD   40 mg at 07/03/21 1519   gabapentin (NEURONTIN) capsule 600 mg  600 mg Oral TID Ernie Avena, MD   600 mg at 07/03/21 1514   levETIRAcetam (KEPPRA) tablet 500 mg  500 mg Oral BID Ernie Avena, MD  magnesium oxide (MAG-OX) tablet 400 mg  400 mg Oral BID Ernie Avena, MD       sacubitril-valsartan (ENTRESTO) 24-26 mg per tablet  1 tablet Oral BID Ernie Avena, MD       spironolactone (ALDACTONE) tablet 12.5 mg  12.5 mg Oral Daily Ernie Avena, MD       traZODone (DESYREL) tablet 50 mg  50 mg Oral QHS Ernie Avena, MD       umeclidinium bromide (INCRUSE ELLIPTA) 62.5 MCG/INH 1 puff  1 puff Inhalation Daily Ernie Avena, MD       Current Outpatient Medications  Medication Sig Dispense Refill   albuterol (VENTOLIN HFA) 108 (90 Base) MCG/ACT inhaler Inhale 2 puffs into the lungs every 6 (six) hours as needed for wheezing or shortness of breath. 8 g 2   aspirin 81 MG chewable tablet Chew 1 tablet (81 mg total) by mouth daily. 30 tablet 0   atorvastatin (LIPITOR) 40 MG tablet TAKE 1 TABLET (40 MG TOTAL) BY MOUTH DAILY. (Patient taking differently: Take 40 mg by mouth at bedtime.) 30 tablet 0   benzonatate (TESSALON) 100 MG capsule Take 1 capsule (100 mg total) by mouth 3 (three) times daily as needed for cough. 20 capsule 0   carvedilol (COREG) 3.125 MG tablet Take 1 tablet (3.125 mg total) by mouth 2 (two) times daily with a meal. 60 tablet 0   furosemide (LASIX) 40 MG tablet Take 1 tablet (40 mg total) by mouth daily. 30 tablet 0   guaiFENesin-dextromethorphan (ROBITUSSIN DM) 100-10 MG/5ML syrup Take 5 mLs by mouth every 6 (six) hours as needed for cough. 118 mL 0   hydrALAZINE (APRESOLINE) 25 MG tablet Take  1 tablet (25 mg total) by mouth every 8 (eight) hours. 90 tablet 0   isosorbide mononitrate (IMDUR) 30 MG 24 hr tablet Take 0.5 tablets (15 mg total) by mouth daily. 15 tablet 0   levETIRAcetam (KEPPRA) 500 MG tablet Take 1 tablet (500 mg total) by mouth 2 (two) times daily. 60 tablet 0   LORazepam (ATIVAN) 1 MG tablet Take 1 mg by mouth 2 (two) times daily.     magnesium oxide (MAG-OX) 400 (241.3 Mg) MG tablet Take 1 tablet (400 mg total) by mouth 2 (two) times daily. 60 tablet 0   oxyCODONE (ROXICODONE) 15 MG immediate release tablet Take 15 mg by mouth every 6 (six) hours as needed for pain.     Potassium Bicarb-Citric Acid (EFFER-K) 20 MEQ TBEF Take 20 mEq by mouth daily.     pregabalin (LYRICA) 100 MG capsule Take 100 mg by mouth 3 (three) times daily.     sacubitril-valsartan (ENTRESTO) 24-26 MG Take 1 tablet by mouth 2 (two) times daily. 60 tablet 0   spironolactone (ALDACTONE) 25 MG tablet Take 0.5 tablets (12.5 mg total) by mouth daily. 30 tablet 6   traZODone (DESYREL) 50 MG tablet Take 1 tablet (50 mg total) by mouth at bedtime. 30 tablet 2   umeclidinium bromide (INCRUSE ELLIPTA) 62.5 MCG/INH AEPB Inhale 1 puff into the lungs daily. 30 each 2   asenapine (SAPHRIS) 5 MG SUBL 24 hr tablet Place 1 tablet (5 mg total) under the tongue 2 (two) times daily as needed (Aggression). (Patient not taking: Reported on 07/03/2021) 20 tablet 0   divalproex (DEPAKOTE ER) 500 MG 24 hr tablet Take 1 tablet (500 mg total) by mouth in the morning and at bedtime. (Patient not taking: Reported on 07/03/2021) 60 tablet 0   gabapentin (NEURONTIN) 300 MG  capsule Take 2 capsules (600 mg total) by mouth 3 (three) times daily. (Patient not taking: Reported on 07/03/2021) 90 capsule 0   potassium chloride (KLOR-CON) 20 MEQ packet Take 20 mEq by mouth daily. (Patient not taking: No sig reported) 30 packet 0   PTA Medications: (Not in a hospital admission)   Musculoskeletal: Strength & Muscle Tone:  history of  stroke Gait & Station:  transferred self to wheelchair Patient leans: N/A  Psychiatric Specialty Exam:  Presentation  General Appearance: Appropriate for Environment  Eye Contact:Fair  Speech:Slurred; Pressured  Speech Volume:Increased  Handedness:Right   Mood and Affect  Mood:Angry; Irritable  Affect:Labile   Thought Process  Thought Processes:Coherent  Descriptions of Associations:Intact  Orientation:Full (Time, Place and Person)  Thought Content:Logical  History of Schizophrenia/Schizoaffective disorder:No data recorded Duration of Psychotic Symptoms:No data recorded Hallucinations:Hallucinations: None Ideas of Reference:None  Suicidal Thoughts:Suicidal Thoughts: Yes, Passive SI Passive Intent and/or Plan: Without Intent; With Plan; Without Means to Carry Out Homicidal Thoughts:Homicidal Thoughts: No  Sensorium  Memory:Immediate Fair; Recent Fair; Remote Fair  Judgment:Poor  Insight:Shallow   Executive Functions  Concentration:Fair  Attention Span:Fair  Recall:Fair  Fund of Knowledge:Fair  Language:Fair   Psychomotor Activity  Psychomotor Activity: Psychomotor Activity: Other (comment) (history of stroke)  Assets  Assets:Desire for Improvement; Housing; Leisure Time; Social Support   Sleep  Sleep: Sleep: Fair   Physical Exam: Physical Exam Constitutional:      General: He is not in acute distress. Cardiovascular:     Rate and Rhythm: Normal rate.  Pulmonary:     Effort: Pulmonary effort is normal.  Musculoskeletal:     Comments: History of stroke. Able to transfer self to wheelchair. Limited mobility.   Skin:    General: Skin is warm and dry.  Neurological:     Mental Status: He is alert and oriented to person, place, and time.  Psychiatric:        Attention and Perception: Attention normal. He perceives auditory hallucinations.        Mood and Affect: Affect is angry.        Speech: Speech is rapid and pressured and  tangential.        Behavior: Behavior is agitated.        Thought Content: Thought content is not paranoid or delusional. Thought content includes suicidal (chronic) ideation. Thought content does not include homicidal ideation. Thought content includes suicidal (take pills (lives in SNF and does not have access to pills)) plan.   Review of Systems  Constitutional:  Negative for chills and fever.  Respiratory:  Negative for shortness of breath.   Cardiovascular:  Negative for chest pain.  Gastrointestinal:  Negative for abdominal pain.  Neurological:  Negative for headaches.  Psychiatric/Behavioral:  Positive for hallucinations and suicidal ideas (chronic for past 1.5 months). Negative for substance abuse. The patient has insomnia.   Blood pressure (!) 153/83, pulse 70, temperature 98.1 F (36.7 C), temperature source Oral, resp. rate 18, SpO2 98 %. There is no height or weight on file to calculate BMI.   Demographic Factors:  Male  Loss Factors: Decline in physical health  Historical Factors: Prior suicide attempts and Family history of mental illness or substance abuse  Risk Reduction Factors:   NA  Continued Clinical Symptoms:  More than one psychiatric diagnosis Medical Diagnoses and Treatments/Surgeries  Cognitive Features That Contribute To Risk:  None    Suicide Risk:  Mild:  Suicidal ideation of limited frequency, intensity, duration, and specificity.  There  are no identifiable plans, no associated intent, mild dysphoria and related symptoms, good self-control (both objective and subjective assessment), few other risk factors, and identifiable protective factors, including available and accessible social support.   Follow-up Information     [facility]-EMERGENCY DEPT.   Specialty: Emergency Medicine Why: As needed Contact information: [id number] [place] [address] [phone]                 Plan Of Care/Follow-up recommendations:  Other:  Safe to return to [place]. Patient requries assistance with ADL's and does not have access to pills.   Disposition: Psychiatrically cleared. Return to SNF once medically cleared. Safety planning reviewed in detail. TOC consult placed. He is encouraged to work with CSW at [place] to move to a different SNF of choice.   EDP, ED staff and behavioral health  coordinator notified of disposition via secure chat.    Novella Olive, NP 07/04/2021, 2:28 PM

## 2021-07-04 NOTE — ED Notes (Addendum)
Pt making inappropriate remarks at nursing staff and being rude regarding staff speaking at the desk. Pt stated he wanted to go to sleep and was offered sleeping medications. Pt yelled at this RN who asked if he wanted medications for sleep and told this RN not to talk to him anymore.

## 2021-07-04 NOTE — Discharge Instructions (Addendum)
For your behavioral health needs you are advised to follow up with an outpatient provider.  If you do not currently have one contact [facility] at your earliest opportunity:       [facility]      [address]      [place], [name] [address]      [phone]      They offer psychiatry/medication management and therapy.  New patients are seen in their walk-in clinic.  Walk-in hours are Monday - Thursday from 8:00 am - 11:00 am for psychiatry, and Friday from 1:00 pm - 4:00 pm for therapy.  Walk-in patients are seen on a first come, first served basis, so try to arrive as early as possible for the best chance of being seen the same day.

## 2021-07-04 NOTE — BH Assessment (Signed)
[facility] Assessment Progress Note   Per Dorena Bodo, NP, this voluntary pt does not require psychiatric hospitalization at this time.  Pt is psychiatrically cleared.  Discharge instructions advise pt to follow up with current outpatient provider, or to contact [facility] at pt's earliest opportunity.  At 12:44 I called [facility] [phone])  pt's SNF, and spoke to [facility].  She agrees to send transportation to pick pt up.  EDP Rolan Bucco, MD and pt's nurse, Abby, have been notified.  Doylene Canning, MA Triage Specialist [phone]

## 2021-07-04 NOTE — ED Provider Notes (Addendum)
Emergency Medicine Observation Re-evaluation Note  [name] is a 55 y.o. male, seen on rounds today.  Pt initially presented to the ED for complaints of Psychiatric Evaluation and Suicidal Currently, the patient is sitting in stretcher.  Physical Exam  BP (!) 153/83 (BP Location: Left Arm)   Pulse 70   Temp 98.1 F (36.7 C) (Oral)   Resp 18   SpO2 98%  Physical Exam General: awake, alert Cardiac: normal rate Lungs: no increased WOB Psych: calm  ED Course / MDM  EKG:   I have reviewed the labs performed to date as well as medications administered while in observation.  Recent changes in the last 24 hours include none.  Plan  Current plan is for reassessment this am.  [name] is not under involuntary commitment.     [name] Bucco, MD 07/04/21 [id number]    [name] Bucco, MD 07/04/21 [phone]

## 2021-07-04 NOTE — ED Notes (Signed)
Patient refusing Vital signs and all medications

## 2021-07-04 NOTE — ED Notes (Signed)
RN attempted to give report to [facility]. No answer.

## 2021-07-04 NOTE — ED Notes (Signed)
Pt cursing because he states that it is too loud here.

## 2021-07-04 NOTE — ED Notes (Signed)
Patient refusing vitals before discharge. Patient said "I dont need none of that. I want to go. I dont need that let me go now." RN informed patient of the importance of vital signs. Patient still refused.

## 2021-11-02 ENCOUNTER — Emergency Department (HOSPITAL_COMMUNITY)
Admission: EM | Admit: 2021-11-02 | Discharge: 2021-11-04 | Disposition: A | Discharge status: Home / Self Care | Payer: Medicaid - Out of State | Attending: Emergency Medicine | Admitting: Emergency Medicine

## 2021-11-02 ENCOUNTER — Encounter (HOSPITAL_COMMUNITY): Payer: Self-pay

## 2021-11-02 ENCOUNTER — Other Ambulatory Visit: Payer: Self-pay

## 2021-11-02 ENCOUNTER — Emergency Department (HOSPITAL_COMMUNITY): Payer: Medicaid - Out of State

## 2021-11-02 DIAGNOSIS — Z91040 Latex allergy status: Secondary | ICD-10-CM | POA: Diagnosis not present

## 2021-11-02 DIAGNOSIS — Z7951 Long term (current) use of inhaled steroids: Secondary | ICD-10-CM | POA: Diagnosis not present

## 2021-11-02 DIAGNOSIS — Z5900 Homelessness unspecified: Secondary | ICD-10-CM

## 2021-11-02 DIAGNOSIS — E119 Type 2 diabetes mellitus without complications: Secondary | ICD-10-CM | POA: Insufficient documentation

## 2021-11-02 DIAGNOSIS — R2243 Localized swelling, mass and lump, lower limb, bilateral: Secondary | ICD-10-CM | POA: Diagnosis not present

## 2021-11-02 DIAGNOSIS — R059 Cough, unspecified: Secondary | ICD-10-CM

## 2021-11-02 DIAGNOSIS — R062 Wheezing: Secondary | ICD-10-CM | POA: Diagnosis not present

## 2021-11-02 DIAGNOSIS — I110 Hypertensive heart disease with heart failure: Secondary | ICD-10-CM | POA: Insufficient documentation

## 2021-11-02 DIAGNOSIS — Z7982 Long term (current) use of aspirin: Secondary | ICD-10-CM | POA: Insufficient documentation

## 2021-11-02 DIAGNOSIS — Z79899 Other long term (current) drug therapy: Secondary | ICD-10-CM | POA: Diagnosis not present

## 2021-11-02 DIAGNOSIS — F329 Major depressive disorder, single episode, unspecified: Secondary | ICD-10-CM | POA: Insufficient documentation

## 2021-11-02 DIAGNOSIS — Z91010 Allergy to peanuts: Secondary | ICD-10-CM | POA: Insufficient documentation

## 2021-11-02 DIAGNOSIS — I5043 Acute on chronic combined systolic (congestive) and diastolic (congestive) heart failure: Secondary | ICD-10-CM | POA: Diagnosis not present

## 2021-11-02 DIAGNOSIS — J441 Chronic obstructive pulmonary disease with (acute) exacerbation: Secondary | ICD-10-CM | POA: Insufficient documentation

## 2021-11-02 DIAGNOSIS — F17210 Nicotine dependence, cigarettes, uncomplicated: Secondary | ICD-10-CM | POA: Diagnosis not present

## 2021-11-02 DIAGNOSIS — R45851 Suicidal ideations: Secondary | ICD-10-CM | POA: Diagnosis not present

## 2021-11-02 DIAGNOSIS — Z20822 Contact with and (suspected) exposure to covid-19: Secondary | ICD-10-CM | POA: Diagnosis not present

## 2021-11-02 DIAGNOSIS — J45909 Unspecified asthma, uncomplicated: Secondary | ICD-10-CM | POA: Diagnosis not present

## 2021-11-02 DIAGNOSIS — I2510 Atherosclerotic heart disease of native coronary artery without angina pectoris: Secondary | ICD-10-CM | POA: Diagnosis not present

## 2021-11-02 DIAGNOSIS — Z765 Malingerer [conscious simulation]: Secondary | ICD-10-CM

## 2021-11-02 DIAGNOSIS — F1419 Cocaine abuse with unspecified cocaine-induced disorder: Secondary | ICD-10-CM | POA: Diagnosis not present

## 2021-11-02 DIAGNOSIS — R0602 Shortness of breath: Secondary | ICD-10-CM | POA: Diagnosis not present

## 2021-11-02 DIAGNOSIS — F250 Schizoaffective disorder, bipolar type: Secondary | ICD-10-CM | POA: Diagnosis present

## 2021-11-02 DIAGNOSIS — F1414 Cocaine abuse with cocaine-induced mood disorder: Secondary | ICD-10-CM | POA: Diagnosis not present

## 2021-11-02 DIAGNOSIS — F602 Antisocial personality disorder: Secondary | ICD-10-CM

## 2021-11-02 DIAGNOSIS — F1420 Cocaine dependence, uncomplicated: Secondary | ICD-10-CM | POA: Diagnosis present

## 2021-11-02 LAB — COMPREHENSIVE METABOLIC PANEL
ALT: 19 U/L (ref 0–44)
AST: 21 U/L (ref 15–41)
Albumin: 3.8 g/dL (ref 3.5–5.0)
Alkaline Phosphatase: 83 U/L (ref 38–126)
Anion gap: 9 (ref 5–15)
BUN: 15 mg/dL (ref 6–20)
CO2: 26 mmol/L (ref 22–32)
Calcium: 8.9 mg/dL (ref 8.9–10.3)
Chloride: 104 mmol/L (ref 98–111)
Creatinine, Ser: 1.05 mg/dL (ref 0.61–1.24)
GFR, Estimated: >60 mL/min (ref >60)
Glucose, Bld: 87 mg/dL (ref 70–99)
Potassium: 3.7 mmol/L (ref 3.5–5.1)
Sodium: 139 mmol/L (ref 135–145)
Total Bilirubin: 0.6 mg/dL (ref 0.3–1.2)
Total Protein: 7.4 g/dL (ref 6.5–8.1)

## 2021-11-02 LAB — CBC WITH DIFFERENTIAL/PLATELET
Abs Immature Granulocytes: 0 10*3/uL (ref 0.00–0.07)
Basophils Absolute: 0 10*3/uL (ref 0.0–0.1)
Basophils Relative: 1 %
Eosinophils Absolute: 0.1 10*3/uL (ref 0.0–0.5)
Eosinophils Relative: 3 %
HCT: 41.1 % (ref 39.0–52.0)
Hemoglobin: 13.6 g/dL (ref 13.0–17.0)
Immature Granulocytes: 0 %
Lymphocytes Relative: 37 %
Lymphs Abs: 2.1 10*3/uL (ref 0.7–4.0)
MCH: 32.1 pg (ref 26.0–34.0)
MCHC: 33.1 g/dL (ref 30.0–36.0)
MCV: 96.9 fL (ref 80.0–100.0)
Monocytes Absolute: 0.6 10*3/uL (ref 0.1–1.0)
Monocytes Relative: 11 %
Neutro Abs: 2.7 10*3/uL (ref 1.7–7.7)
Neutrophils Relative %: 48 %
Platelets: 260 10*3/uL (ref 150–400)
RBC: 4.24 MIL/uL (ref 4.22–5.81)
RDW: 14.3 % (ref 11.5–15.5)
WBC: 5.6 10*3/uL (ref 4.0–10.5)
nRBC: 0 % (ref 0.0–0.2)

## 2021-11-02 LAB — RESP PANEL BY RT-PCR (FLU A&B, COVID) ARPGX2
Influenza A by PCR: NEGATIVE
Influenza B by PCR: NEGATIVE
SARS Coronavirus 2 by RT PCR: NEGATIVE

## 2021-11-02 LAB — RAPID URINE DRUG SCREEN, HOSP PERFORMED
Amphetamines: NOT DETECTED
Barbiturates: NOT DETECTED
Benzodiazepines: NOT DETECTED
Cocaine: POSITIVE — AB
Opiates: NOT DETECTED
Tetrahydrocannabinol: NOT DETECTED

## 2021-11-02 LAB — BRAIN NATRIURETIC PEPTIDE: B Natriuretic Peptide: 100 pg/mL (ref 0.0–100.0)

## 2021-11-02 IMAGING — DX DG CHEST 1V PORT
1 series · 1 of 1 positions shown · non-contrast
Comparison: [DATE]

CLINICAL DATA: Shortness of breath for 1-2 months.

EXAM:
PORTABLE CHEST 1 VIEW

[chest ap]
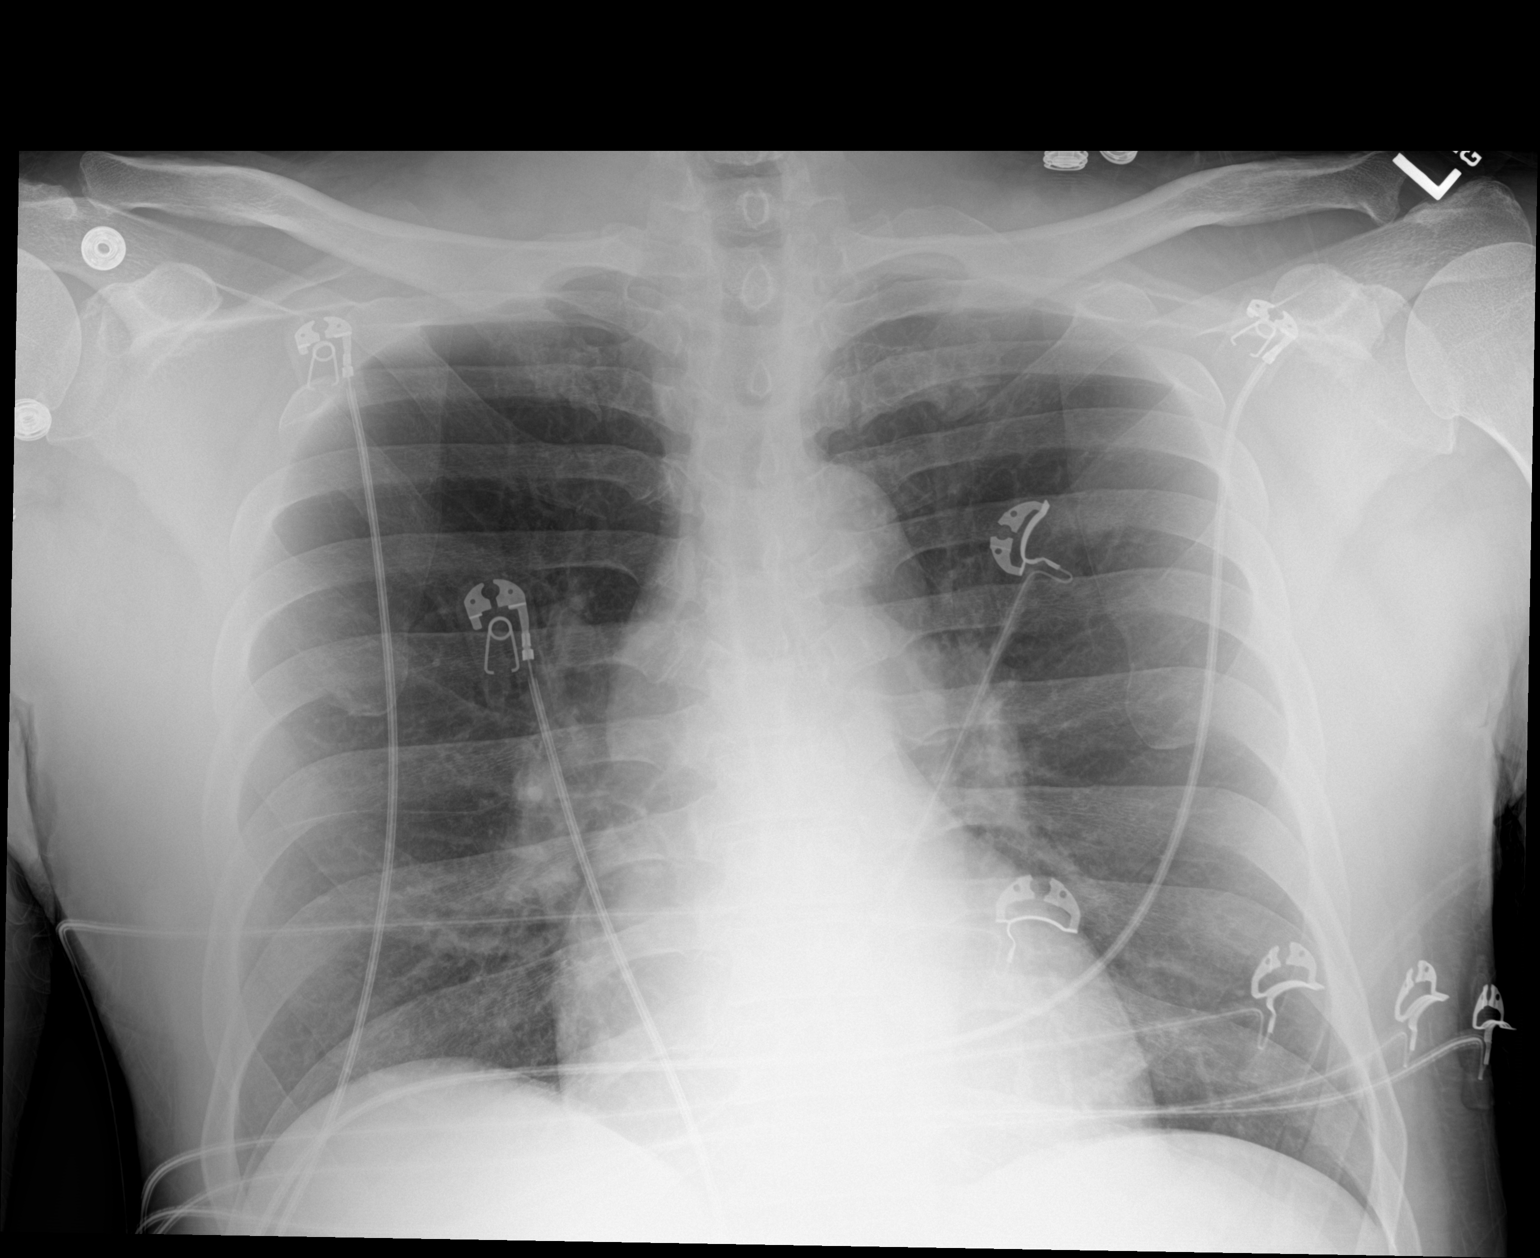

[1 of 1 positions shown; findings below may reference images not displayed]

FINDINGS: The heart size and mediastinal contours are within normal limits.
Both lungs are clear. The visualized skeletal structures are
unremarkable.
IMPRESSION: No active disease.

## 2021-11-02 MED ORDER — ALBUTEROL SULFATE (2.5 MG/3ML) 0.083% IN NEBU: 2.5000 mg | INHALATION_SOLUTION | Freq: Four times a day (QID) | RESPIRATORY_TRACT | Status: DC | PRN

## 2021-11-02 MED ORDER — CARVEDILOL 3.125 MG PO TABS
3.1250 mg | ORAL_TABLET | Freq: Two times a day (BID) | ORAL | Status: DC
  Administered 2021-11-03 – 2021-11-04 (×3): 3.125 mg via ORAL
  Filled 2021-11-02 (×3): qty 1

## 2021-11-02 MED ORDER — UMECLIDINIUM BROMIDE 62.5 MCG/ACT IN AEPB
1.0000 | INHALATION_SPRAY | Freq: Every day | RESPIRATORY_TRACT | Status: DC
Start: 2021-11-02 — End: 2021-11-04
  Administered 2021-11-04: 1 via RESPIRATORY_TRACT
  Filled 2021-11-02: qty 7

## 2021-11-02 MED ORDER — GABAPENTIN 300 MG PO CAPS
600.0000 mg | ORAL_CAPSULE | Freq: Three times a day (TID) | ORAL | Status: DC
  Filled 2021-11-02 (×2): qty 2

## 2021-11-02 MED ORDER — PREGABALIN 50 MG PO CAPS
100.0000 mg | ORAL_CAPSULE | Freq: Three times a day (TID) | ORAL | Status: DC
  Administered 2021-11-03 – 2021-11-04 (×3): 100 mg via ORAL
  Filled 2021-11-02 (×5): qty 2

## 2021-11-02 MED ORDER — ZOLPIDEM TARTRATE 5 MG PO TABS: 5.0000 mg | ORAL_TABLET | Freq: Every evening | ORAL | Status: DC | PRN

## 2021-11-02 MED ORDER — TRAZODONE HCL 50 MG PO TABS
50.0000 mg | ORAL_TABLET | Freq: Every day | ORAL | Status: DC
  Filled 2021-11-02: qty 1

## 2021-11-02 MED ORDER — POTASSIUM BICARB-CITRIC ACID 20 MEQ PO TBEF: 20.0000 meq | EFFERVESCENT_TABLET | Freq: Every day | ORAL | Status: DC

## 2021-11-02 MED ORDER — ASENAPINE MALEATE 5 MG SL SUBL: 5.0000 mg | SUBLINGUAL_TABLET | Freq: Two times a day (BID) | SUBLINGUAL | Status: DC | PRN

## 2021-11-02 MED ORDER — FUROSEMIDE 40 MG PO TABS
40.0000 mg | ORAL_TABLET | Freq: Every day | ORAL | Status: DC
  Administered 2021-11-02 – 2021-11-04 (×2): 40 mg via ORAL
  Filled 2021-11-02 (×4): qty 1

## 2021-11-02 MED ORDER — DEXAMETHASONE SODIUM PHOSPHATE 10 MG/ML IJ SOLN
10.0000 mg | Freq: Once | INTRAMUSCULAR | Status: DC
  Filled 2021-11-02: qty 1

## 2021-11-02 MED ORDER — FUROSEMIDE 10 MG/ML IJ SOLN
40.0000 mg | Freq: Once | INTRAMUSCULAR | Status: AC
  Administered 2021-11-02: 40 mg via INTRAVENOUS
  Filled 2021-11-02: qty 4

## 2021-11-02 MED ORDER — RISPERIDONE 0.5 MG PO TBDP: 2.0000 mg | ORAL_TABLET | Freq: Three times a day (TID) | ORAL | Status: DC | PRN

## 2021-11-02 MED ORDER — LEVETIRACETAM 500 MG PO TABS
500.0000 mg | ORAL_TABLET | Freq: Two times a day (BID) | ORAL | Status: DC
  Administered 2021-11-02 – 2021-11-04 (×3): 500 mg via ORAL
  Filled 2021-11-02 (×4): qty 1

## 2021-11-02 MED ORDER — SPIRONOLACTONE 12.5 MG HALF TABLET
12.5000 mg | ORAL_TABLET | Freq: Every day | ORAL | Status: DC
  Filled 2021-11-02 (×3): qty 1

## 2021-11-02 MED ORDER — ASPIRIN 81 MG PO CHEW
81.0000 mg | CHEWABLE_TABLET | Freq: Every day | ORAL | Status: DC
  Administered 2021-11-02 – 2021-11-04 (×3): 81 mg via ORAL
  Filled 2021-11-02 (×3): qty 1

## 2021-11-02 MED ORDER — ATORVASTATIN CALCIUM 40 MG PO TABS
40.0000 mg | ORAL_TABLET | Freq: Every day | ORAL | Status: DC
  Administered 2021-11-02 – 2021-11-04 (×3): 40 mg via ORAL
  Filled 2021-11-02 (×3): qty 1

## 2021-11-02 MED ORDER — LORAZEPAM 1 MG PO TABS: 1.0000 mg | ORAL_TABLET | ORAL | Status: DC | PRN

## 2021-11-02 MED ORDER — SACUBITRIL-VALSARTAN 24-26 MG PO TABS
1.0000 | ORAL_TABLET | Freq: Two times a day (BID) | ORAL | Status: DC
Start: 2021-11-02 — End: 2021-11-04
  Administered 2021-11-03 – 2021-11-04 (×2): 1 via ORAL
  Filled 2021-11-02 (×5): qty 1

## 2021-11-02 MED ORDER — ALBUTEROL SULFATE HFA 108 (90 BASE) MCG/ACT IN AERS: 2.0000 | INHALATION_SPRAY | Freq: Four times a day (QID) | RESPIRATORY_TRACT | Status: DC | PRN

## 2021-11-02 MED ORDER — ZIPRASIDONE MESYLATE 20 MG IM SOLR: 20.0000 mg | INTRAMUSCULAR | Status: DC | PRN

## 2021-11-02 MED ORDER — ALBUTEROL SULFATE HFA 108 (90 BASE) MCG/ACT IN AERS: 2.0000 | INHALATION_SPRAY | RESPIRATORY_TRACT | Status: DC | PRN

## 2021-11-02 MED ORDER — DIVALPROEX SODIUM ER 500 MG PO TB24
500.0000 mg | ORAL_TABLET | Freq: Every day | ORAL | Status: DC
  Administered 2021-11-02 – 2021-11-04 (×2): 500 mg via ORAL
  Filled 2021-11-02 (×3): qty 1

## 2021-11-02 NOTE — ED Notes (Signed)
Pt given sandwich and drink per request
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
## 2021-11-02 NOTE — ED Notes (Signed)
Pt found urinating into trash can, states there were no more urinals. Unknown amount of output

## 2021-11-02 NOTE — ED Provider Notes (Signed)
[facility]-EMERGENCY DEPT Provider Note   CSN: [id number] Arrival date & time: 11/02/21  1539     History Chief Complaint  Patient presents with   Leg Swelling    With SI    [name] is a 55 y.o. male.  HPI     55 year old male comes in with chief complaint of leg swelling and suicidal ideation.  He has history of CHF with a EF less than 10%, COPD, diabetes and is homeless.  Patient also has schizoaffective disorder.  He reports that over the last few months he has been homeless and living on the streets.  He is now having suicidal ideation.  He has also not taken his medication over several weeks.  Patient is now having some shortness of breath, cough.  He reports increased leg swelling.    Past Medical History:  Diagnosis Date   Asthma    Bipolar 1 disorder (HCC)    CHF (congestive heart failure) (HCC)    COPD (chronic obstructive pulmonary disease) (HCC)    Depression    Diabetes mellitus without complication (HCC)    GERD (gastroesophageal reflux disease)    Homelessness    Hypertension    QT prolongation    Schizoaffective disorder (HCC)     Patient Active Problem List   Diagnosis Date Noted   Foot pain 04/23/2021   Acute on chronic combined systolic (congestive) and diastolic (congestive) heart failure (HCC) 03/02/2021   History of stroke 03/02/2021   Substance use disorder 03/02/2021   Skin ulcer of right great toe with necrosis of bone (HCC)    Encounter for assessment of healthcare decision-making capacity    Pain, dental    Dystonia    Dysarthria    Shock liver 01/04/2021   Acute metabolic encephalopathy 01/04/2021   CAD (coronary artery disease) 01/04/2021   HTN (hypertension) 01/04/2021   Bipolar I disorder (HCC) 01/04/2021   Cocaine abuse (HCC) 01/04/2021   Homelessness 01/04/2021   HLD (hyperlipidemia) 01/04/2021   Osteomyelitis of great toe (HCC)    Ulcer of great toe, left, with necrosis of bone (HCC)    Ulcer  of great toe, right, with necrosis of muscle (HCC)    Acute osteomyelitis of toe, left (HCC)    Acute on chronic combined systolic and diastolic CHF (congestive heart failure) (HCC) 12/20/2020   Respiratory failure (HCC)    Ischemic stroke (HCC) 12/17/2020   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   Diabetic foot ulcer (HCC) 12/08/2020   CHF (congestive heart failure) (HCC) 11/14/2020   CHF exacerbation (HCC) 11/13/2020   Hypokalemia    Hyperlipidemia    Acute respiratory failure with hypoxia (HCC) 10/26/2020   COPD with acute exacerbation (HCC) 10/14/2020   Acute on chronic combined systolic and diastolic CHF (congestive heart failure) (HCC) 10/02/2020   Near syncope 07/21/2020   Schizoaffective disorder (HCC) 06/24/2019   SOB (shortness of breath)    QT prolongation 05/25/2019   Cocaine abuse (HCC)    Elevated troponin    Homeless    Suicidal ideation    Bilateral foot pain 04/05/2019   Housing situation unstable 03/28/2019   Bilateral lower extremity edema    Asthma 12/26/2016   Tobacco use disorder 12/26/2016   Schizophrenia (HCC) 12/26/2016   Cocaine abuse with cocaine-induced mood disorder (HCC) 09/06/2016   Alcohol use disorder, mild, abuse 07/31/2016   Substance induced mood disorder (HCC) 07/30/2016   Schizoaffective disorder, bipolar type (HCC) 05/31/2016   Cocaine use disorder, severe, dependence (HCC)  05/31/2016   Hypertension 05/31/2016    Past Surgical History:  Procedure Laterality Date   AMPUTATION TOE Bilateral 01/02/2021   Procedure: AMPUTATION TOE;  Surgeon: Park Liter, DPM;  Location: [facility] OR;  Service: Podiatry;  Laterality: Bilateral;  Ok to leave patient on hospital bed   IR ANGIO INTRA EXTRACRAN SEL COM CAROTID INNOMINATE BILAT MOD SED  12/17/2020   IR ANGIO VERTEBRAL SEL VERTEBRAL BILAT MOD SED  12/17/2020   RADIOLOGY WITH ANESTHESIA N/A 12/17/2020   Procedure: IR WITH ANESTHESIA;  Surgeon: Radiologist, Medication, MD;  Location: [facility] OR;  Service:  Radiology;  Laterality: N/A;       Family History  Problem Relation Age of Onset   Drug abuse Mother    Suicidality Mother     Social History   Tobacco Use   Smoking status: Every Day    Years: 30.00    Types: Cigarettes   Smokeless tobacco: Never  Vaping Use   Vaping Use: Never used  Substance Use Topics   Alcohol use: Yes    Comment: infrequently   Drug use: Yes    Types: Cocaine, Marijuana, "Crack" cocaine    Home Medications Prior to Admission medications   Medication Sig Start Date End Date Taking? Authorizing Provider  albuterol (VENTOLIN HFA) 108 (90 Base) MCG/ACT inhaler Inhale 2 puffs into the lungs every 6 (six) hours as needed for wheezing or shortness of breath. 11/16/20   Pokhrel, Rebekah [place], MD  asenapine (SAPHRIS) 5 MG SUBL 24 hr tablet Place 1 tablet (5 mg total) under the tongue 2 (two) times daily as needed (Aggression). Patient not taking: Reported on 07/03/2021 03/12/21   Jerald Kief, MD  aspirin 81 MG chewable tablet Chew 1 tablet (81 mg total) by mouth daily. 02/03/21   Leatha Gilding, MD  atorvastatin (LIPITOR) 40 MG tablet TAKE 1 TABLET (40 MG TOTAL) BY MOUTH DAILY. Patient taking differently: Take 40 mg by mouth at bedtime. 12/10/20 12/10/21  Jackelyn Poling, DO  benzonatate (TESSALON) 100 MG capsule Take 1 capsule (100 mg total) by mouth 3 (three) times daily as needed for cough. 03/12/21   Jerald Kief, MD  carvedilol (COREG) 3.125 MG tablet Take 1 tablet (3.125 mg total) by mouth 2 (two) times daily with a meal. 11/16/20   Pokhrel, Rebekah [place], MD  divalproex (DEPAKOTE ER) 500 MG 24 hr tablet Take 1 tablet (500 mg total) by mouth in the morning and at bedtime. Patient not taking: Reported on 07/03/2021 02/03/21   Leatha Gilding, MD  furosemide (LASIX) 40 MG tablet Take 1 tablet (40 mg total) by mouth daily. 02/03/21   Leatha Gilding, MD  gabapentin (NEURONTIN) 300 MG capsule Take 2 capsules (600 mg total) by mouth 3 (three) times daily. Patient not taking:  Reported on 07/03/2021 02/03/21   Leatha Gilding, MD  guaiFENesin-dextromethorphan [facility] DM) 100-10 MG/5ML syrup Take 5 mLs by mouth every 6 (six) hours as needed for cough. 03/12/21   Jerald Kief, MD  hydrALAZINE (APRESOLINE) 25 MG tablet Take 1 tablet (25 mg total) by mouth every 8 (eight) hours. 03/12/21 07/03/21  Jerald Kief, MD  isosorbide mononitrate (IMDUR) 30 MG 24 hr tablet Take 0.5 tablets (15 mg total) by mouth daily. 03/12/21 07/03/21  Jerald Kief, MD  levETIRAcetam (KEPPRA) 500 MG tablet Take 1 tablet (500 mg total) by mouth 2 (two) times daily. 02/03/21   Leatha Gilding, MD  LORazepam (ATIVAN) 1 MG tablet Take 1 mg by mouth  2 (two) times daily.    [provider]  magnesium oxide (MAG-OX) 400 (241.3 Mg) MG tablet Take 1 tablet (400 mg total) by mouth 2 (two) times daily. 02/03/21   Leatha Gilding, MD  oxyCODONE (ROXICODONE) 15 MG immediate release tablet Take 15 mg by mouth every 6 (six) hours as needed for pain.    [provider]  Potassium Bicarb-Citric Acid (EFFER-K) 20 MEQ TBEF Take 20 mEq by mouth daily.    [provider]  potassium chloride (KLOR-CON) 20 MEQ packet Take 20 mEq by mouth daily. Patient not taking: No sig reported 03/13/21 07/03/21  Jerald Kief, MD  pregabalin (LYRICA) 100 MG capsule Take 100 mg by mouth 3 (three) times daily.    [provider]  sacubitril-valsartan (ENTRESTO) 24-26 MG Take 1 tablet by mouth 2 (two) times daily. 02/03/21   Leatha Gilding, MD  spironolactone (ALDACTONE) 25 MG tablet Take 0.5 tablets (12.5 mg total) by mouth daily. 01/15/21   Russella Dar, NP  traZODone (DESYREL) 50 MG tablet Take 1 tablet (50 mg total) by mouth at bedtime. 11/16/20   Pokhrel, Rebekah [place], MD  umeclidinium bromide (INCRUSE ELLIPTA) 62.5 MCG/INH AEPB Inhale 1 puff into the lungs daily. 11/16/20   Pokhrel, Rebekah Chesterfield, MD    Allergies    Ibuprofen, Acetaminophen, Latex, Lyrica cr [pregabalin er], and Peanut-containing drug  products  Review of Systems   Review of Systems  Constitutional:  Positive for activity change.  Respiratory:  Positive for cough and shortness of breath.   Cardiovascular:  Negative for chest pain.  Psychiatric/Behavioral:  Positive for suicidal ideas.   All other systems reviewed and are negative.  Physical Exam Updated Vital Signs BP (!) 155/137 (BP Location: Left Arm)   Pulse 85   Temp 98 F (36.7 C) (Oral)   Resp (!) 28   Ht 5\' 9"  (1.753 m)   Wt 86.2 kg   SpO2 97%   BMI 28.06 kg/m   Physical Exam Vitals and nursing note reviewed.  Constitutional:      Appearance: He is well-developed.  HENT:     Head: Atraumatic.  Cardiovascular:     Rate and Rhythm: Normal rate.  Pulmonary:     Effort: Pulmonary effort is normal.     Breath sounds: Wheezing and rales present.  Musculoskeletal:     Cervical back: Neck supple.     Right lower leg: Edema present.     Left lower leg: Edema present.  Skin:    General: Skin is warm.  Neurological:     Mental Status: He is alert and oriented to person, place, and time.    ED Results / Procedures / Treatments   Labs (all labs ordered are listed, but only abnormal results are displayed) Labs Reviewed  RAPID URINE DRUG SCREEN, HOSP PERFORMED - Abnormal; Notable for the following components:      Result Value   Cocaine POSITIVE (*)    All other components within normal limits  RESP PANEL BY RT-PCR (FLU A&B, COVID) ARPGX2  COMPREHENSIVE METABOLIC PANEL  CBC WITH DIFFERENTIAL/PLATELET  BRAIN NATRIURETIC PEPTIDE    EKG EKG Interpretation  Date/Time:  Saturday November 02 2021 15:56:29 EST Ventricular Rate:  87 PR Interval:  210 QRS Duration: 103 QT Interval:  427 QTC Calculation: 514 R Axis:   41 Text Interpretation: Sinus rhythm Borderline prolonged PR interval Probable left atrial enlargement LVH with secondary repolarization abnormality Prolonged QT interval No acute changes No significant change since last  tracing  Confirmed by Derwood Kaplan [phone]) on 11/02/2021 4:08:03 PM  Radiology DG Chest Port 1 View  Result Date: 11/02/2021 CLINICAL DATA:  Shortness of breath for 1-2 months. EXAM: PORTABLE CHEST 1 VIEW COMPARISON:  March 02, 2021 FINDINGS: The heart size and mediastinal contours are within normal limits. Both lungs are clear. The visualized skeletal structures are unremarkable. IMPRESSION: No active disease. Electronically Signed   By: Ted Mcalpine M.D.   On: 11/02/2021 17:41    Procedures Procedures   Medications Ordered in ED Medications  risperiDONE (RISPERDAL M-TABS) disintegrating tablet 2 mg (has no administration in time range)    And  LORazepam (ATIVAN) tablet 1 mg (has no administration in time range)    And  ziprasidone (GEODON) injection 20 mg (has no administration in time range)  zolpidem (AMBIEN) tablet 5 mg (has no administration in time range)  asenapine (SAPHRIS) sublingual tablet 5 mg (has no administration in time range)  aspirin chewable tablet 81 mg (81 mg Oral Given 11/02/21 2055)  atorvastatin (LIPITOR) tablet 40 mg (40 mg Oral Given 11/02/21 2055)  carvedilol (COREG) tablet 3.125 mg (has no administration in time range)  divalproex (DEPAKOTE ER) 24 hr tablet 500 mg (500 mg Oral Given 11/02/21 2055)  furosemide (LASIX) tablet 40 mg (40 mg Oral Given 11/02/21 2055)  gabapentin (NEURONTIN) capsule 600 mg (has no administration in time range)  levETIRAcetam (KEPPRA) tablet 500 mg (500 mg Oral Given 11/02/21 2055)  Potassium Bicarb-Citric Acid 20 MEQ TBEF 20 mEq (has no administration in time range)  sacubitril-valsartan (ENTRESTO) 24-26 mg per tablet (has no administration in time range)  pregabalin (LYRICA) capsule 100 mg (has no administration in time range)  spironolactone (ALDACTONE) tablet 12.5 mg (has no administration in time range)  traZODone (DESYREL) tablet 50 mg (has no administration in time range)  umeclidinium bromide (INCRUSE ELLIPTA) 62.5 MCG/ACT 1 puff  (has no administration in time range)  albuterol (PROVENTIL) (2.5 MG/3ML) 0.083% nebulizer solution 2.5 mg (has no administration in time range)  furosemide (LASIX) injection 40 mg (40 mg Intravenous Given 11/02/21 1720)  furosemide (LASIX) injection 40 mg (40 mg Intravenous Given 11/02/21 2055)    ED Course  I have reviewed the triage vital signs and the nursing notes.  Pertinent labs & imaging results that were available during my care of the patient were reviewed by me and considered in my medical decision making (see chart for details).    MDM Rules/Calculators/A&P                           55 year old male comes in with chief complaint of cough, suicidal ideation.  He is also having increased leg swelling  Patient has quite advanced CHF along with COPD.  Clinically appears to be volume overload also probably some COPD exacerbation.  X-ray is reassuring.  He is slightly tachypneic, he has been off his medication which is probably not helping.  We will give him some IV Lasix.  Appropriate labs ordered.  X-rays reviewed independently.  Patient admits to using some cocaine occasionally.  No chest pain.  Patient is also having SI.  My suspicion is that the homelessness is probably driving his SI, but patient is adamant that he was not suicidal despite being homeless over the last several weeks and that the SI feeling has been actually present for few days but getting worse.  10:08 PM Medically cleared for psychiatric evaluation. He likely has COPD exacerbation.  Final Clinical Impression(s) / ED Diagnoses Final diagnoses:  Cough  Suicidal ideation    Rx / DC Orders ED Discharge Orders     None        Derwood Kaplan, MD 11/02/21 2209

## 2021-11-02 NOTE — ED Notes (Signed)
Pt NAD, tachypnic. A/ox4, speaking in full and complete sentences. Pt states he has been having increased leg swelling, SOB, CP, cough, since being out of his medicines x 1-2 months and also using cocaine. Pt also states he has been depressed x 1-2 months and states he thinks about rolling his wheelchair into the street to get hit. Bilateral wheezes noted in LS. Other VSS.

## 2021-11-02 NOTE — ED Notes (Signed)
Debbe Odea [facility] requesting TTS with pt. TTS machine placed in pt room awaiting eval

## 2021-11-02 NOTE — ED Triage Notes (Signed)
Pt BIBA for SI and leg swelling. Pt NAD in bed, a/ox4, states he has been depressed x 1-2 months and wants to roll his wheelchair out into traffic. Pt also c/o bilat leg swelling, coughwith green/yellow phlegm, SOB x 1-2 months s/p being out of his medicines. Speaking in full sentences. Wheezes throughout lung fields, tachypnic.

## 2021-11-02 NOTE — ED Notes (Signed)
Pt given sandwich per request.

## 2021-11-03 DIAGNOSIS — F1414 Cocaine abuse with cocaine-induced mood disorder: Secondary | ICD-10-CM

## 2021-11-03 MED ORDER — BENZONATATE 100 MG PO CAPS
100.0000 mg | ORAL_CAPSULE | Freq: Three times a day (TID) | ORAL | Status: DC | PRN
  Administered 2021-11-03 – 2021-11-04 (×2): 100 mg via ORAL
  Filled 2021-11-03 (×2): qty 1

## 2021-11-03 NOTE — ED Notes (Signed)
Patient alert this shift. Patient refused certain medications this shift.(See MAR). Patient cooperative with staff. Patient irritable at times.  No aggression noted. No suicidal ideation noted. No homicidal ideation noted.  No paranoia, delusions, or hallucinations noted.

## 2021-11-03 NOTE — ED Notes (Signed)
Pt woke up and called out with call bell. Pt asked for another food tray. Went to pt room with his night medications. Attempted to scan pt armband. Pt said, "Where's my drink? I can't take that without something to drink." I explained that I had to scan his armband, scan in the medications, verify them on the computer, and I would return with his medications and water." Pt said he didn't want water, cursed me, refused his medications, and insisted on speaking to the house supervisor.

## 2021-11-03 NOTE — Consult Note (Addendum)
Telepsych Consultation   Reason for Consult:  psych consult Referring Physician:  Varney Biles, MD Location of Patient: [facility] [id number] Location of Provider: [place] Department  Patient Identification: [name] MRN:  [id number] Principal Diagnosis: Cocaine abuse with cocaine-induced mood disorder ([place]) Diagnosis:  Principal Problem:   Cocaine abuse with cocaine-induced mood disorder ([place]) Active Problems:   Cocaine use disorder, severe, dependence ([place])   Total Time spent with patient: 15 minutes  Subjective:   [name] is a 55 y.o. male patient admitted with suicidal ideations.  Patient presents resting in bed; easily arousable. Speech impediment noted; garbled, slow.  "I had a little problem with psychiatry and my feet. They were swelling up pretty bad".   Says mom completed suicide at 75 and he was 86. When asked to clarify patient became agitated and stated "I'm not going to say no more about that". Mentions recently living at [facility] but no longer living there for reasons unclear during assessment. Mood labile. States he hasn't had his medications, unable to state which medications; on chart review patient refused am dose of Depakote, Furosemide. Provider attempted to explain target symptoms of the medications; patient states Depakote makes his sick and was previously on Zyprexa in which he liked. Stated "lasix" made him "pee a lot"; provider further explained the how the medication could improve the swelling in his leg and he agreed to take medication. Nurse notified. EKG reviewed; prolonged QT interval, borderline PR interval noted. History of CHF with EF<10%.   He continues to endorse suicidal ideations with plan to ride his wheelchair in front of a bus; denies any homicidal ideations, auditory or visual hallucinations, and no active psychosis noted. Unable to safety plan.   HPI:  [name] is a 55 year old male with past history of CHF (Ef<10%),  prolonged QT interval, COPD, diabetes, right BKA, homelessness, and schizoaffective disorder who presented to [facility] for suicidal ideations. Patient no longer living at facility and has been living on the streets not taking his medications. Endorsed plan on riding wheelchair in front of a bus. UDS+cocaine.   Past Psychiatric History: schizoaffective disorder, bipolar 1 disorder, cocaine use disorder, substance induced mood disorder  Risk to Self:   Risk to Others:   Prior Inpatient Therapy:   Prior Outpatient Therapy:    Past Medical History:  Past Medical History:  Diagnosis Date   Asthma    Bipolar 1 disorder ([place])    CHF (congestive heart failure) (HCC)    COPD (chronic obstructive pulmonary disease) ([place])    Depression    Diabetes mellitus without complication (HCC)    GERD (gastroesophageal reflux disease)    Homelessness    Hypertension    QT prolongation    Schizoaffective disorder ([place])     Past Surgical History:  Procedure Laterality Date   AMPUTATION TOE Bilateral 01/02/2021   Procedure: AMPUTATION TOE;  Surgeon: Evelina Bucy, DPM;  Location: [place];  Service: Podiatry;  Laterality: Bilateral;  Ok to leave patient on hospital bed   IR ANGIO INTRA EXTRACRAN SEL COM CAROTID INNOMINATE BILAT MOD SED  12/17/2020   IR ANGIO VERTEBRAL SEL VERTEBRAL BILAT MOD SED  12/17/2020   RADIOLOGY WITH ANESTHESIA N/A 12/17/2020   Procedure: IR WITH ANESTHESIA;  Surgeon: Radiologist, Medication, MD;  Location: [place];  Service: Radiology;  Laterality: N/A;   Family History:  Family History  Problem Relation Age of Onset   Drug abuse Mother    Suicidality Mother  Family Psychiatric  History: not noted Social History:  Social History   Substance and Sexual Activity  Alcohol Use Yes   Comment: infrequently     Social History   Substance and Sexual Activity  Drug Use Yes   Types: Cocaine, Marijuana, "Crack" cocaine    Social History   Socioeconomic History   Marital status:  Single    Spouse name: Not on file   Number of children: 4   Years of education: Not on file   Highest education level: Not on file  Occupational History   Occupation: N/A  Tobacco Use   Smoking status: Every Day    Years: 30.00    Types: Cigarettes   Smokeless tobacco: Never  Vaping Use   Vaping Use: Never used  Substance and Sexual Activity   Alcohol use: Yes    Comment: infrequently   Drug use: Yes    Types: Cocaine, Marijuana, "Crack" cocaine   Sexual activity: Not Currently    Partners: Female    Birth control/protection: None  Other Topics Concern   Not on file  Social History Narrative   ** Merged History Encounter **       Social Determinants of Health   Financial Resource Strain: Not on file  Food Insecurity: Not on file  Transportation Needs: Not on file  Physical Activity: Not on file  Stress: Not on file  Social Connections: Not on file   Additional Social History:    Allergies:   Allergies  Allergen Reactions   Ibuprofen Anaphylaxis   Acetaminophen Hives, Nausea And Vomiting and Rash    Pt overdosed on bottle of APAP 05/06/16    Latex Other (See Comments)    Pt denies allergy to latex 03/02/2021   Lyrica Cr [Pregabalin Er] Other (See Comments)    Dystonia and dysarthria   Peanut-Containing Drug Products Other (See Comments)    Pt denies allergy to peanut butter 03/02/2021    Labs:  Results for orders placed or performed during the hospital encounter of 11/02/21 (from the past 48 hour(s))  Resp Panel by RT-PCR (Flu A&B, Covid) Nasopharyngeal Swab     Status: None   Collection Time: 11/02/21  5:13 PM   Specimen: Nasopharyngeal Swab; Nasopharyngeal(NP) swabs in vial transport medium  Result Value Ref Range   SARS Coronavirus 2 by RT PCR NEGATIVE NEGATIVE    Comment: (NOTE) SARS-CoV-2 target nucleic acids are NOT DETECTED.  The SARS-CoV-2 RNA is generally detectable in upper respiratory specimens during the acute phase of infection. The  lowest concentration of SARS-CoV-2 viral copies this assay can detect is 138 copies/mL. A negative result does not preclude SARS-Cov-2 infection and should not be used as the sole basis for treatment or other patient management decisions. A negative result may occur with  improper specimen collection/handling, submission of specimen other than nasopharyngeal swab, presence of viral mutation(s) within the areas targeted by this assay, and inadequate number of viral copies(<138 copies/mL). A negative result must be combined with clinical observations, patient history, and epidemiological information. The expected result is Negative.  Fact Sheet for Patients:  EntrepreneurPulse.com.au  Fact Sheet for Healthcare Providers:  IncredibleEmployment.be  This test is no t yet approved or cleared by the [place] FDA and  has been authorized for detection and/or diagnosis of SARS-CoV-2 by FDA under an Emergency Use Authorization (EUA). This EUA will remain  in effect (meaning this test can be used) for the duration of the COVID-19 declaration under Section 564(b)(1) of the  Act, 21 U.S.C.section 360bbb-3(b)(1), unless the authorization is terminated  or revoked sooner.       Influenza A by PCR NEGATIVE NEGATIVE   Influenza B by PCR NEGATIVE NEGATIVE    Comment: (NOTE) The Xpert Xpress SARS-CoV-2/FLU/RSV plus assay is intended as an aid in the diagnosis of influenza from Nasopharyngeal swab specimens and should not be used as a sole basis for treatment. Nasal washings and aspirates are unacceptable for Xpert Xpress SARS-CoV-2/FLU/RSV testing.  Fact Sheet for Patients: BloggerCourse.com  Fact Sheet for Healthcare Providers: SeriousBroker.it  This test is not yet approved or cleared by the [place] FDA and has been authorized for detection and/or diagnosis of SARS-CoV-2 by FDA under an Emergency  Use Authorization (EUA). This EUA will remain in effect (meaning this test can be used) for the duration of the COVID-19 declaration under Section 564(b)(1) of the Act, 21 U.S.C. section 360bbb-3(b)(1), unless the authorization is terminated or revoked.  Performed at [facility], [address], [place], [name] [address]   Comprehensive metabolic panel     Status: None   Collection Time: 11/02/21  5:13 PM  Result Value Ref Range   Sodium 139 135 - 145 mmol/L   Potassium 3.7 3.5 - 5.1 mmol/L   Chloride 104 98 - 111 mmol/L   CO2 26 22 - 32 mmol/L   Glucose, Bld 87 70 - 99 mg/dL    Comment: Glucose reference range applies only to samples taken after fasting for at least 8 hours.   BUN 15 6 - 20 mg/dL   Creatinine, Ser 0.60 0.61 - 1.24 mg/dL   Calcium 8.9 8.9 - 04.5 mg/dL   Total Protein 7.4 6.5 - 8.1 g/dL   Albumin 3.8 3.5 - 5.0 g/dL   AST 21 15 - 41 U/L   ALT 19 0 - 44 U/L   Alkaline Phosphatase 83 38 - 126 U/L   Total Bilirubin 0.6 0.3 - 1.2 mg/dL   GFR, Estimated >99 >77 mL/min    Comment: (NOTE) Calculated using the CKD-EPI Creatinine Equation (2021)    Anion gap 9 5 - 15    Comment: Performed at [facility], [address], [place], [name] [address]  Urine rapid drug screen (hosp performed)     Status: Abnormal   Collection Time: 11/02/21  5:13 PM  Result Value Ref Range   Opiates NONE DETECTED NONE DETECTED   Cocaine POSITIVE (A) NONE DETECTED   Benzodiazepines NONE DETECTED NONE DETECTED   Amphetamines NONE DETECTED NONE DETECTED   Tetrahydrocannabinol NONE DETECTED NONE DETECTED   Barbiturates NONE DETECTED NONE DETECTED    Comment: (NOTE) DRUG SCREEN FOR MEDICAL PURPOSES ONLY.  IF CONFIRMATION IS NEEDED FOR ANY PURPOSE, NOTIFY LAB WITHIN 5 DAYS.  LOWEST DETECTABLE LIMITS FOR URINE DRUG SCREEN Drug Class                     Cutoff (ng/mL) Amphetamine and metabolites    1000 Barbiturate and metabolites     200 Benzodiazepine                 200 Tricyclics and metabolites     300 Opiates and metabolites        300 Cocaine and metabolites        300 THC                            50 Performed at [facility]  [place] [address] [place], [name] [address]   CBC with Diff     Status: None   Collection Time: 11/02/21  5:13 PM  Result Value Ref Range   WBC 5.6 4.0 - 10.5 K/uL   RBC 4.24 4.22 - 5.81 MIL/uL   Hemoglobin 13.6 13.0 - 17.0 g/dL   HCT 41.1 39.0 - 52.0 %   MCV 96.9 80.0 - 100.0 fL   MCH 32.1 26.0 - 34.0 pg   MCHC 33.1 30.0 - 36.0 g/dL   RDW 14.3 11.5 - 15.5 %   Platelets 260 150 - 400 K/uL   nRBC 0.0 0.0 - 0.2 %   Neutrophils Relative % 48 %   Neutro Abs 2.7 1.7 - 7.7 K/uL   Lymphocytes Relative 37 %   Lymphs Abs 2.1 0.7 - 4.0 K/uL   Monocytes Relative 11 %   Monocytes Absolute 0.6 0.1 - 1.0 K/uL   Eosinophils Relative 3 %   Eosinophils Absolute 0.1 0.0 - 0.5 K/uL   Basophils Relative 1 %   Basophils Absolute 0.0 0.0 - 0.1 K/uL   Immature Granulocytes 0 %   Abs Immature Granulocytes 0.00 0.00 - 0.07 K/uL    Comment: Performed at [facility], [place] [address], [place], [place] [address]  Brain natriuretic peptide     Status: None   Collection Time: 11/02/21  5:14 PM  Result Value Ref Range   B Natriuretic Peptide 100.0 0.0 - 100.0 pg/mL    Comment: Performed at [facility], [place] [address], [place], [place] [address]    Medications:  Current Facility-Administered Medications  Medication Dose Route Frequency Provider Last Rate Last Admin   albuterol (VENTOLIN HFA) 108 (90 Base) MCG/ACT inhaler 2 puff  2 puff Inhalation Q4H PRN Kathrynn Humble, Ankit, MD       asenapine (SAPHRIS) sublingual tablet 5 mg  5 mg Sublingual BID PRN Kathrynn Humble, Ankit, MD       aspirin chewable tablet 81 mg  81 mg Oral Daily Nanavati, Ankit, MD   81 mg at 11/03/21 1138   atorvastatin (LIPITOR) tablet 40 mg  40 mg Oral Daily Nanavati, Ankit, MD   40  mg at 11/03/21 1139   benzonatate (TESSALON) capsule 100 mg  100 mg Oral TID PRN Dorie Rank, MD   100 mg at 11/03/21 1310   carvedilol (COREG) tablet 3.125 mg  3.125 mg Oral BID WC Nanavati, Ankit, MD   3.125 mg at 11/03/21 1138   dexamethasone (DECADRON) injection 10 mg  10 mg Intravenous Once Kathrynn Humble, Ankit, MD       divalproex (DEPAKOTE ER) 24 hr tablet 500 mg  500 mg Oral Daily Nanavati, Ankit, MD   500 mg at 11/02/21 2055   furosemide (LASIX) tablet 40 mg  40 mg Oral Daily Nanavati, Ankit, MD   40 mg at 11/02/21 2055   levETIRAcetam (KEPPRA) tablet 500 mg  500 mg Oral BID Varney Biles, MD   500 mg at 11/03/21 0954   risperiDONE (RISPERDAL M-TABS) disintegrating tablet 2 mg  2 mg Oral Q8H PRN Kathrynn Humble, Ankit, MD       And   LORazepam (ATIVAN) tablet 1 mg  1 mg Oral PRN Kathrynn Humble, Ankit, MD       And   ziprasidone (GEODON) injection 20 mg  20 mg Intramuscular PRN Nanavati, Ankit, MD       pregabalin (LYRICA) capsule 100 mg  100 mg Oral TID Varney Biles, MD   100  mg at 11/03/21 1138   sacubitril-valsartan (ENTRESTO) 24-26 mg per tablet  1 tablet Oral BID Derwood Kaplan, MD   1 tablet at 11/03/21 1761   spironolactone (ALDACTONE) tablet 12.5 mg  12.5 mg Oral Daily Derwood Kaplan, MD       traZODone (DESYREL) tablet 50 mg  50 mg Oral QHS Nanavati, Ankit, MD       umeclidinium bromide (INCRUSE ELLIPTA) 62.5 MCG/ACT 1 puff  1 puff Inhalation Daily Nanavati, Ankit, MD       zolpidem (AMBIEN) tablet 5 mg  5 mg Oral QHS PRN Derwood Kaplan, MD       Current Outpatient Medications  Medication Sig Dispense Refill   albuterol (VENTOLIN HFA) 108 (90 Base) MCG/ACT inhaler Inhale 2 puffs into the lungs every 6 (six) hours as needed for wheezing or shortness of breath. (Patient not taking: Reported on 11/02/2021) 8 g 2   asenapine (SAPHRIS) 5 MG SUBL 24 hr tablet Place 1 tablet (5 mg total) under the tongue 2 (two) times daily as needed (Aggression). (Patient not taking: Reported on 07/03/2021) 20  tablet 0   aspirin 81 MG chewable tablet Chew 1 tablet (81 mg total) by mouth daily. (Patient not taking: Reported on 11/02/2021) 30 tablet 0   atorvastatin (LIPITOR) 40 MG tablet TAKE 1 TABLET (40 MG TOTAL) BY MOUTH DAILY. (Patient not taking: Reported on 11/02/2021) 30 tablet 0   benzonatate (TESSALON) 100 MG capsule Take 1 capsule (100 mg total) by mouth 3 (three) times daily as needed for cough. (Patient not taking: Reported on 11/02/2021) 20 capsule 0   carvedilol (COREG) 3.125 MG tablet Take 1 tablet (3.125 mg total) by mouth 2 (two) times daily with a meal. (Patient not taking: Reported on 11/02/2021) 60 tablet 0   divalproex (DEPAKOTE ER) 500 MG 24 hr tablet Take 1 tablet (500 mg total) by mouth in the morning and at bedtime. (Patient not taking: Reported on 07/03/2021) 60 tablet 0   furosemide (LASIX) 40 MG tablet Take 1 tablet (40 mg total) by mouth daily. (Patient not taking: Reported on 11/02/2021) 30 tablet 0   gabapentin (NEURONTIN) 300 MG capsule Take 2 capsules (600 mg total) by mouth 3 (three) times daily. (Patient not taking: Reported on 07/03/2021) 90 capsule 0   guaiFENesin-dextromethorphan (ROBITUSSIN DM) 100-10 MG/5ML syrup Take 5 mLs by mouth every 6 (six) hours as needed for cough. 118 mL 0   hydrALAZINE (APRESOLINE) 25 MG tablet Take 1 tablet (25 mg total) by mouth every 8 (eight) hours. (Patient not taking: Reported on 11/02/2021) 90 tablet 0   isosorbide mononitrate (IMDUR) 30 MG 24 hr tablet Take 0.5 tablets (15 mg total) by mouth daily. (Patient not taking: Reported on 11/02/2021) 15 tablet 0   levETIRAcetam (KEPPRA) 500 MG tablet Take 1 tablet (500 mg total) by mouth 2 (two) times daily. (Patient not taking: Reported on 11/02/2021) 60 tablet 0   LORazepam (ATIVAN) 1 MG tablet Take 1 mg by mouth 2 (two) times daily. (Patient not taking: Reported on 11/02/2021)     magnesium oxide (MAG-OX) 400 (241.3 Mg) MG tablet Take 1 tablet (400 mg total) by mouth 2 (two) times daily. (Patient not  taking: Reported on 11/02/2021) 60 tablet 0   oxyCODONE (ROXICODONE) 15 MG immediate release tablet Take 15 mg by mouth every 6 (six) hours as needed for pain. (Patient not taking: Reported on 11/02/2021)     Potassium Bicarb-Citric Acid (EFFER-K) 20 MEQ TBEF Take 20 mEq by mouth daily. (Patient not  taking: Reported on 11/02/2021)     potassium chloride (KLOR-CON) 20 MEQ packet Take 20 mEq by mouth daily. (Patient not taking: No sig reported) 30 packet 0   pregabalin (LYRICA) 100 MG capsule Take 100 mg by mouth 3 (three) times daily. (Patient not taking: Reported on 11/02/2021)     sacubitril-valsartan (ENTRESTO) 24-26 MG Take 1 tablet by mouth 2 (two) times daily. (Patient not taking: Reported on 11/02/2021) 60 tablet 0   spironolactone (ALDACTONE) 25 MG tablet Take 0.5 tablets (12.5 mg total) by mouth daily. (Patient not taking: Reported on 11/02/2021) 30 tablet 6   traZODone (DESYREL) 50 MG tablet Take 1 tablet (50 mg total) by mouth at bedtime. (Patient not taking: Reported on 11/02/2021) 30 tablet 2   umeclidinium bromide (INCRUSE ELLIPTA) 62.5 MCG/INH AEPB Inhale 1 puff into the lungs daily. (Patient not taking: Reported on 11/02/2021) 30 each 2   Musculoskeletal: Strength & Muscle Tone: decreased Gait & Station:  unable to fully assess; pt in wheelchair Patient leans: N/A  Psychiatric Specialty Exam:  Presentation  General Appearance: Appropriate for Environment  Eye Contact:Fair  Speech:Slurred; Pressured  Speech Volume:Increased  Handedness:Right   Mood and Affect  Mood:Angry; Irritable  Affect:Labile   Thought Process  Thought Processes:Coherent  Descriptions of Associations:Intact  Orientation:Full (Time, Place and Person)  Thought Content:Logical  History of Schizophrenia/Schizoaffective disorder:No data recorded Duration of Psychotic Symptoms:No data recorded Hallucinations:No data recorded Ideas of Reference:None  Suicidal Thoughts:No data recorded Homicidal  Thoughts:No data recorded  Sensorium  Memory:Immediate Fair; Recent Fair; Remote Fair  Judgment:Poor  Insight:Shallow   Executive Functions  Concentration:Fair  Attention Span:Fair  [name]   Psychomotor Activity  Psychomotor Activity:No data recorded  Assets  Assets:Desire for Improvement; Housing; Leisure Time; Social Support   Sleep  Sleep:No data recorded   Physical Exam: Physical Exam Vitals and nursing note reviewed.  Constitutional:      Appearance: He is ill-appearing.  HENT:     Head: Normocephalic.     Nose: Nose normal.     Mouth/Throat:     Mouth: Mucous membranes are moist.     Pharynx: Oropharynx is clear.  Eyes:     Pupils: Pupils are equal, round, and reactive to light.  Cardiovascular:     Rate and Rhythm: Normal rate.     Pulses: Normal pulses.  Pulmonary:     Effort: Pulmonary effort is normal.  Musculoskeletal:        General: Normal range of motion.     Cervical back: Normal range of motion.  Skin:    General: Skin is warm and dry.  Neurological:     Mental Status: He is alert and oriented to person, place, and time.  Psychiatric:        Attention and Perception: Attention and perception normal.        Mood and Affect: Affect is labile, blunt and angry.        Speech: Speech is delayed.        Behavior: Behavior is agitated.        Thought Content: Thought content includes suicidal ideation. Thought content does not include homicidal ideation. Thought content does not include homicidal plan.        Judgment: Judgment is impulsive.   Review of Systems  Psychiatric/Behavioral:  Positive for substance abuse and suicidal ideas.   All other systems reviewed and are negative. Blood pressure (!) 151/85, pulse 89, temperature 98.1 F (36.7 C), temperature source  Oral, resp. rate 20, height 5\' 9"  (1.753 m), weight 86.2 kg, SpO2 97 %. Body mass index is 28.06 kg/m.  Treatment Plan  Summary: Daily contact with patient to assess and evaluate symptoms and progress in treatment, Medication management, and Plan continue to assess and monitory patient; reassess in morning. SW/TOC consult placed to address psychosocial concerns.   Disposition: Supportive therapy provided about ongoing stressors. Patient to remain on continuous assessment in ED and be reassessed by psychiatry. Patient remains voluntary at this time.   This service was provided via telemedicine using a 2-way, interactive audio and video technology.  Names of all persons participating in this telemedicine service and their role in this encounter. Name: Oneida Alar Role: PMHNP  Name: Hampton Abbot Role: Attending MD  Name: [name] Role: patient  Name:  Role:     Inda Merlin, NP 11/03/2021 1:17 PM

## 2021-11-03 NOTE — ED Provider Notes (Signed)
Emergency Medicine Observation Re-evaluation Note  [name] is a 55 y.o. male, seen on rounds today.  Pt initially presented to the ED for complaints of Leg Swelling (With SI) and Suicidal Currently, the patient is resting this morning.  Physical Exam  BP (!) 151/85 (BP Location: Left Arm)   Pulse 89   Temp 98.1 F (36.7 C) (Oral)   Resp 20   Ht 1.753 m (5\' 9" )   Wt 86.2 kg   SpO2 97%   BMI 28.06 kg/m  Physical Exam General: Calm, resting Cardiac: Giller rate Lungs: Breathing easily Psych: Calm mood  ED Course / MDM  EKG:EKG Interpretation  Date/Time:  Saturday November 02 2021 15:56:29 EST Ventricular Rate:  87 PR Interval:  210 QRS Duration: 103 QT Interval:  427 QTC Calculation: 514 R Axis:   41 Text Interpretation: Sinus rhythm Borderline prolonged PR interval Probable left atrial enlargement LVH with secondary repolarization abnormality Prolonged QT interval No acute changes No significant change since last tracing Confirmed by 04[birth year]28[birth year]1993 [id number]) on 11/02/2021 4:08:03 PM  I have reviewed the labs performed to date as well as medications administered while in observation.  Recent changes in the last 24 hours include no acute medical issues overnight.  Plan  Current plan is for plan is for observation and reassessment by the psychiatry team.  14/01/2021 is not under involuntary commitment.     [name], MD 11/03/21 [phone]

## 2021-11-04 ENCOUNTER — Other Ambulatory Visit (HOSPITAL_COMMUNITY): Payer: Self-pay

## 2021-11-04 DIAGNOSIS — F602 Antisocial personality disorder: Secondary | ICD-10-CM

## 2021-11-04 DIAGNOSIS — R45851 Suicidal ideations: Secondary | ICD-10-CM

## 2021-11-04 DIAGNOSIS — F1419 Cocaine abuse with unspecified cocaine-induced disorder: Secondary | ICD-10-CM

## 2021-11-04 DIAGNOSIS — Z765 Malingerer [conscious simulation]: Secondary | ICD-10-CM

## 2021-11-04 MED ORDER — OLANZAPINE 5 MG PO TABS: 5.0000 mg | ORAL_TABLET | Freq: Every day | ORAL | 0 refills | Status: DC

## 2021-11-04 MED ORDER — CARBAMIDE PEROXIDE 6.5 % OT SOLN
5.0000 [drp] | Freq: Two times a day (BID) | OTIC | Status: DC
  Filled 2021-11-04: qty 15

## 2021-11-04 MED ORDER — CARBAMIDE PEROXIDE 6.5 % OT SOLN
5.0000 [drp] | Freq: Two times a day (BID) | OTIC | 0 refills | Status: DC
  Filled 2021-11-04: qty 15, 30d supply, fill #0

## 2021-11-04 NOTE — ED Notes (Signed)
Pt is alert, cooperative, yet agitated. Pt requesting additional meal trays, graham crackers, and ice cream. Pt provided graham crackers. Ice cream is unavailable in the ED. Today, pt ate 2 additional meal trays at meal times, 6 sandwiches, and "many" graham crackers.
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
## 2021-11-04 NOTE — ED Notes (Signed)
Pt refused vitals 

## 2021-11-04 NOTE — BH Assessment (Signed)
BHH Assessment Progress Note   Per Caryn Bee, NP, this voluntary pt does not require psychiatric hospitalization at this time.  Pt is psychiatrically cleared.  Discharge instructions include referral information for [facility].  A TOC consult has been ordered to address pt's psychosocial needs.  EDP Linwood Dibbles, MD and pt's nurse, Addison Naegeli, have been notified.  Doylene Canning, MA Triage Specialist [phone]

## 2021-11-04 NOTE — Consult Note (Signed)
[facility] Psych ED Discharge  11/04/2021 10:57 AM [name]  MRN:  [id number]  Method of visit?: Face to Face   Principal Problem: Cocaine abuse with cocaine-induced mood disorder [facility]) Discharge Diagnoses: Active Problems:   Schizoaffective disorder, bipolar type (HCC)   Cocaine use disorder, severe, dependence (HCC)   Homelessness   Antisocial personality disorder (HCC)   Malingering    Subjective: [name] is a 55 year old male with history of schizoaffective disorder, polysubstance abuse(cocaine, cannabis, alcohol), antisocial personality disorder who presented to the ED x2 days ago for suicidal ideations with intent and plan to run his wheelchair in front of a bus.  During this admission patient has been noted to be labile, irritable, demanding, and gamey at times.  He does appear to become easily frustrated when asking questions, and requests extra food trays as well.  He further reports having difficulty with nursing staff at nighttime, not getting things when he asked.  Chart review further supports patient was any nursing facility called [facility], however left and discharged to self.  He reports continuing to take olanzapine, and has been compliant with his medication when he has it.  He endorses ongoing substance use particularly cocaine and alcohol.  He does not feel as if the cocaine plays a part into his mood disorder and suicidal thoughts.  He is requesting assistance with finding long-term housing, and is thereby requesting social work consult.   On reevaluation, patient frequently states he needs help finding an assisted living facility.  He continues to endorse due to his disabilities and physical limitations, he is unable to stay in a shelter.  He makes mention of his personal wheelchair that is located at the bedside.  Overall on today's reassessment, patient does have some sustained improvement in his affect, his mood is congruent and euthymic.  Patient does endorse  ongoing stressors, however is unable to elaborate on such and remains very vague such as" things just are not going well for me.  I need a place to stay."  When assessing for suicidal ideations, he does endorse some thoughts although he is very vague with his responses again he reports" things just are not going well for me.  I just do not know."  He denies any homicidal ideation, and or auditory or visual hallucinations.  Suspect patient is here for the sole purposes of housing, has a history of malingering.  Will psychiatrically clear at this time.  TOC consult has been placed yesterday, and social work is following him closely.   HPI:  [name] is a 55 year old male with past history of CHF (Ef<10%), prolonged QT interval, COPD, diabetes, right BKA, homelessness, and schizoaffective disorder who presented to [facility] for suicidal ideations. Patient no longer living at facility and has been living on the streets not taking his medications. Endorsed plan on riding wheelchair in front of a bus. UDS+cocaine.   Total Time spent with patient: 30 minutes  Past Psychiatric History: schizoaffective disorder, bipolar 1 disorder, cocaine use disorder, substance induced mood disorder. Last Inpatient admission 07/24/21 [facility],   Past Medical History:  Past Medical History:  Diagnosis Date   Asthma    Bipolar 1 disorder (HCC)    CHF (congestive heart failure) (HCC)    COPD (chronic obstructive pulmonary disease) (HCC)    Depression    Diabetes mellitus without complication (HCC)    GERD (gastroesophageal reflux disease)    Homelessness    Hypertension    QT prolongation  Schizoaffective disorder [facility])     Past Surgical History:  Procedure Laterality Date   AMPUTATION TOE Bilateral 01/02/2021   Procedure: AMPUTATION TOE;  Surgeon: Park Liter, DPM;  Location: [facility] OR;  Service: Podiatry;  Laterality: Bilateral;  Ok to leave patient on hospital bed   IR ANGIO INTRA EXTRACRAN SEL COM  CAROTID INNOMINATE BILAT MOD SED  12/17/2020   IR ANGIO VERTEBRAL SEL VERTEBRAL BILAT MOD SED  12/17/2020   RADIOLOGY WITH ANESTHESIA N/A 12/17/2020   Procedure: IR WITH ANESTHESIA;  Surgeon: Radiologist, Medication, MD;  Location: [facility] OR;  Service: Radiology;  Laterality: N/A;   Family History:  Family History  Problem Relation Age of Onset   Drug abuse Mother    Suicidality Mother    Family Psychiatric  History:  Social History:  Social History   Substance and Sexual Activity  Alcohol Use Yes   Comment: infrequently     Social History   Substance and Sexual Activity  Drug Use Yes   Types: Cocaine, Marijuana, "Crack" cocaine    Social History   Socioeconomic History   Marital status: Single    Spouse name: Not on file   Number of children: 4   Years of education: Not on file   Highest education level: Not on file  Occupational History   Occupation: N/A  Tobacco Use   Smoking status: Every Day    Years: 30.00    Types: Cigarettes   Smokeless tobacco: Never  Vaping Use   Vaping Use: Never used  Substance and Sexual Activity   Alcohol use: Yes    Comment: infrequently   Drug use: Yes    Types: Cocaine, Marijuana, "Crack" cocaine   Sexual activity: Not Currently    Partners: Female    Birth control/protection: None  Other Topics Concern   Not on file  Social History Narrative   ** Merged History Encounter **       Social Determinants of Health   Financial Resource Strain: Not on file  Food Insecurity: Not on file  Transportation Needs: Not on file  Physical Activity: Not on file  Stress: Not on file  Social Connections: Not on file    Tobacco Cessation:  N/A, patient does not currently use tobacco products  Current Medications: Current Facility-Administered Medications  Medication Dose Route Frequency Provider Last Rate Last Admin   albuterol (VENTOLIN HFA) 108 (90 Base) MCG/ACT inhaler 2 puff  2 puff Inhalation Q4H PRN Nanavati, Ankit, MD        asenapine (SAPHRIS) sublingual tablet 5 mg  5 mg Sublingual BID PRN Derwood Kaplan, MD       aspirin chewable tablet 81 mg  81 mg Oral Daily Nanavati, Ankit, MD   81 mg at 11/04/21 0900   atorvastatin (LIPITOR) tablet 40 mg  40 mg Oral Daily Nanavati, Ankit, MD   40 mg at 11/04/21 0900   benzonatate (TESSALON) capsule 100 mg  100 mg Oral TID PRN Linwood Dibbles, MD   100 mg at 11/04/21 0815   carbamide peroxide (DEBROX) 6.5 % OTIC (EAR) solution 5 drop  5 drop Both EARS BID Linwood Dibbles, MD       carvedilol (COREG) tablet 3.125 mg  3.125 mg Oral BID WC Nanavati, Ankit, MD   3.125 mg at 11/04/21 0806   dexamethasone (DECADRON) injection 10 mg  10 mg Intravenous Once Derwood Kaplan, MD       divalproex (DEPAKOTE ER) 24 hr tablet 500 mg  500 mg Oral Daily  Derwood Kaplan, MD   500 mg at 11/04/21 0900   furosemide (LASIX) tablet 40 mg  40 mg Oral Daily Rhunette Croft, Ankit, MD   40 mg at 11/04/21 0900   levETIRAcetam (KEPPRA) tablet 500 mg  500 mg Oral BID Derwood Kaplan, MD   500 mg at 11/04/21 0900   risperiDONE (RISPERDAL M-TABS) disintegrating tablet 2 mg  2 mg Oral Q8H PRN Derwood Kaplan, MD       And   LORazepam (ATIVAN) tablet 1 mg  1 mg Oral PRN Derwood Kaplan, MD       And   ziprasidone (GEODON) injection 20 mg  20 mg Intramuscular PRN Derwood Kaplan, MD       pregabalin (LYRICA) capsule 100 mg  100 mg Oral TID Derwood Kaplan, MD   100 mg at 11/04/21 0900   sacubitril-valsartan (ENTRESTO) 24-26 mg per tablet  1 tablet Oral BID Derwood Kaplan, MD   1 tablet at 11/04/21 0900   spironolactone (ALDACTONE) tablet 12.5 mg  12.5 mg Oral Daily Nanavati, Ankit, MD       traZODone (DESYREL) tablet 50 mg  50 mg Oral QHS Nanavati, Ankit, MD       umeclidinium bromide (INCRUSE ELLIPTA) 62.5 MCG/ACT 1 puff  1 puff Inhalation Daily Nanavati, Ankit, MD   1 puff at 11/04/21 0900   zolpidem (AMBIEN) tablet 5 mg  5 mg Oral QHS PRN Derwood Kaplan, MD       Current Outpatient Medications  Medication Sig Dispense  Refill   albuterol (VENTOLIN HFA) 108 (90 Base) MCG/ACT inhaler Inhale 2 puffs into the lungs every 6 (six) hours as needed for wheezing or shortness of breath. (Patient not taking: Reported on 11/02/2021) 8 g 2   asenapine (SAPHRIS) 5 MG SUBL 24 hr tablet Place 1 tablet (5 mg total) under the tongue 2 (two) times daily as needed (Aggression). (Patient not taking: Reported on 07/03/2021) 20 tablet 0   aspirin 81 MG chewable tablet Chew 1 tablet (81 mg total) by mouth daily. (Patient not taking: Reported on 11/02/2021) 30 tablet 0   atorvastatin (LIPITOR) 40 MG tablet TAKE 1 TABLET (40 MG TOTAL) BY MOUTH DAILY. (Patient not taking: Reported on 11/02/2021) 30 tablet 0   benzonatate (TESSALON) 100 MG capsule Take 1 capsule (100 mg total) by mouth 3 (three) times daily as needed for cough. (Patient not taking: Reported on 11/02/2021) 20 capsule 0   carvedilol (COREG) 3.125 MG tablet Take 1 tablet (3.125 mg total) by mouth 2 (two) times daily with a meal. (Patient not taking: Reported on 11/02/2021) 60 tablet 0   divalproex (DEPAKOTE ER) 500 MG 24 hr tablet Take 1 tablet (500 mg total) by mouth in the morning and at bedtime. (Patient not taking: Reported on 07/03/2021) 60 tablet 0   furosemide (LASIX) 40 MG tablet Take 1 tablet (40 mg total) by mouth daily. (Patient not taking: Reported on 11/02/2021) 30 tablet 0   gabapentin (NEURONTIN) 300 MG capsule Take 2 capsules (600 mg total) by mouth 3 (three) times daily. (Patient not taking: Reported on 07/03/2021) 90 capsule 0   guaiFENesin-dextromethorphan (ROBITUSSIN DM) 100-10 MG/5ML syrup Take 5 mLs by mouth every 6 (six) hours as needed for cough. 118 mL 0   hydrALAZINE (APRESOLINE) 25 MG tablet Take 1 tablet (25 mg total) by mouth every 8 (eight) hours. (Patient not taking: Reported on 11/02/2021) 90 tablet 0   isosorbide mononitrate (IMDUR) 30 MG 24 hr tablet Take 0.5 tablets (15 mg total) by mouth  daily. (Patient not taking: Reported on 11/02/2021) 15 tablet 0    levETIRAcetam (KEPPRA) 500 MG tablet Take 1 tablet (500 mg total) by mouth 2 (two) times daily. (Patient not taking: Reported on 11/02/2021) 60 tablet 0   LORazepam (ATIVAN) 1 MG tablet Take 1 mg by mouth 2 (two) times daily. (Patient not taking: Reported on 11/02/2021)     magnesium oxide (MAG-OX) 400 (241.3 Mg) MG tablet Take 1 tablet (400 mg total) by mouth 2 (two) times daily. (Patient not taking: Reported on 11/02/2021) 60 tablet 0   oxyCODONE (ROXICODONE) 15 MG immediate release tablet Take 15 mg by mouth every 6 (six) hours as needed for pain. (Patient not taking: Reported on 11/02/2021)     Potassium Bicarb-Citric Acid (EFFER-K) 20 MEQ TBEF Take 20 mEq by mouth daily. (Patient not taking: Reported on 11/02/2021)     potassium chloride (KLOR-CON) 20 MEQ packet Take 20 mEq by mouth daily. (Patient not taking: No sig reported) 30 packet 0   pregabalin (LYRICA) 100 MG capsule Take 100 mg by mouth 3 (three) times daily. (Patient not taking: Reported on 11/02/2021)     sacubitril-valsartan (ENTRESTO) 24-26 MG Take 1 tablet by mouth 2 (two) times daily. (Patient not taking: Reported on 11/02/2021) 60 tablet 0   spironolactone (ALDACTONE) 25 MG tablet Take 0.5 tablets (12.5 mg total) by mouth daily. (Patient not taking: Reported on 11/02/2021) 30 tablet 6   traZODone (DESYREL) 50 MG tablet Take 1 tablet (50 mg total) by mouth at bedtime. (Patient not taking: Reported on 11/02/2021) 30 tablet 2   umeclidinium bromide (INCRUSE ELLIPTA) 62.5 MCG/INH AEPB Inhale 1 puff into the lungs daily. (Patient not taking: Reported on 11/02/2021) 30 each 2   PTA Medications: (Not in a hospital admission)   Musculoskeletal: Strength & Muscle Tone: within normal limits Gait & Station: normal Patient leans: N/A  Psychiatric Specialty Exam:  Presentation  General Appearance: Appropriate for Environment; Casual  Eye Contact:Fair  Speech:Clear and Coherent; Normal Rate (dysarthria)  Speech  Volume:Normal  Handedness:Right   Mood and Affect  Mood:Anxious  Affect:Appropriate; Congruent   Thought Process  Thought Processes:Coherent; Linear  Descriptions of Associations:Intact  Orientation:Full (Time, Place and Person)  Thought Content:Logical  History of Schizophrenia/Schizoaffective disorder:No data recorded Duration of Psychotic Symptoms:No data recorded Hallucinations:Hallucinations: None  Ideas of Reference:None  Suicidal Thoughts:Suicidal Thoughts: Yes, Passive SI Passive Intent and/or Plan: Without Intent; Without Plan; Without Means to Carry Out; Without Access to Means  Homicidal Thoughts:Homicidal Thoughts: No   Sensorium  Memory:Immediate Fair; Recent Fair; Remote Fair  Judgment:Poor  Insight:Shallow   Executive Functions  Concentration:Fair  Attention Span:Fair  Recall:Fair  Fund of Knowledge:Fair  Language:Fair   Psychomotor Activity  Psychomotor Activity:Psychomotor Activity: Normal   Assets  Assets:Desire for Improvement; Physical Health; Social Support; Leisure Time   Sleep  Sleep:Sleep: Fair    Physical Exam: Physical Exam Vitals and nursing note reviewed.  Constitutional:      Appearance: Normal appearance. He is normal weight.  HENT:     Head: Normocephalic.  Eyes:     Pupils: Pupils are equal, round, and reactive to light.  Neurological:     General: No focal deficit present.     Mental Status: He is alert and oriented to person, place, and time. Mental status is at baseline.  Psychiatric:        Mood and Affect: Mood normal.        Behavior: Behavior normal.        Thought  Content: Thought content normal.        Judgment: Judgment normal.   Review of Systems  Psychiatric/Behavioral:  Positive for depression and suicidal ideas (passive ideations). Substance abuse: cocaine.  All other systems reviewed and are negative. Blood pressure (!) 146/99, pulse 67, temperature 98.1 F (36.7 C), temperature  source Oral, resp. rate 18, height 5\' 9"  (1.753 m), weight 86.2 kg, SpO2 95 %. Body mass index is 28.06 kg/m.   Demographic Factors:  Male, Low socioeconomic status, and Unemployed  Loss Factors: Decrease in vocational status and Financial problems/change in socioeconomic status  Historical Factors: Prior suicide attempts, Family history of mental illness or substance abuse, and Impulsivity  Risk Reduction Factors:   Living with another person, especially a relative, Positive social support, Positive therapeutic relationship, and Positive coping skills or problem solving skills  Continued Clinical Symptoms:  Severe Anxiety and/or Agitation Bipolar Disorder:   Bipolar II Alcohol/Substance Abuse/Dependencies More than one psychiatric diagnosis Unstable or Poor Therapeutic Relationship Previous Psychiatric Diagnoses and Treatments  Cognitive Features That Contribute To Risk:  None    Suicide Risk:  Minimal: No identifiable suicidal ideation.  Patients presenting with no risk factors but with morbid ruminations; may be classified as minimal risk based on the severity of the depressive symptoms    Plan Of Care/Follow-up recommendations:  Other:  Please follow up with outpatient psychiatry for management of your psychiatric prescriptions. You will be prescribed Zyprexa. Please try to refrain from illicit substances to include cocaine, as this does impair your mood.   Disposition: Psych cleared. Prescription has been printed and given to patient. Also will need to follow up with PCP for referrals and FL2 for assisted living.  , FNP 11/04/2021, 10:57 AM

## 2021-11-04 NOTE — Discharge Instructions (Signed)
Follow-up with the recommendations of the behavioral health team.  For the wax buildup in your ear you can take over-the-counter drops to place and year to help loosen the wax.

## 2021-11-04 NOTE — ED Provider Notes (Addendum)
Emergency Medicine Observation Re-evaluation Note  [name] is a 55 y.o. male, seen on rounds today.  Pt initially presented to the ED for complaints of Leg Swelling (With SI) and Suicidal Currently, the patient is complaining of some discomfort in his right ear.  Patient states he cannot hear well out of it.  Physical Exam  BP 138/79 (BP Location: Right Arm)   Pulse 75   Temp 98.1 F (36.7 C) (Oral)   Resp 18   Ht 1.753 m (5\' 9" )   Wt 86.2 kg   SpO2 96%   BMI 28.06 kg/m  Physical Exam General: Alert, calm Cardiac: Regular rate and rhythm Lungs: Breathing easily normal respiratory effort Psych: Normal mood Ear exam: Cerumen impaction noted right ear, no purulent drainage, no erythema of the external auditory canal ED Course / MDM  EKG:EKG Interpretation  Date/Time:  Saturday November 02 2021 15:56:29 EST Ventricular Rate:  87 PR Interval:  210 QRS Duration: 103 QT Interval:  427 QTC Calculation: 514 R Axis:   41 Text Interpretation: Sinus rhythm Borderline prolonged PR interval Probable left atrial enlargement LVH with secondary repolarization abnormality Prolonged QT interval No acute changes No significant change since last tracing Confirmed by 04-28-1993 [phone]) on 11/02/2021 4:08:03 PM  I have reviewed the labs performed to date as well as medications administered while in observation.  Recent changes in the last 24 hours include initial theatric evaluation..  Plan  Current plan is for reassessment by psychiatry team as the patient holds in the ED.  [name] is not under involuntary commitment.     [name] Frohlich, MD 11/04/21 0901 Patient cleared by psychiatry at this time for outpatient treatment   0902, MD 11/04/21 1122

## 2021-11-07 ENCOUNTER — Emergency Department (HOSPITAL_BASED_OUTPATIENT_CLINIC_OR_DEPARTMENT_OTHER): Payer: Medicaid Other

## 2021-11-07 ENCOUNTER — Encounter (HOSPITAL_COMMUNITY): Payer: Self-pay

## 2021-11-07 ENCOUNTER — Emergency Department (HOSPITAL_COMMUNITY)
Admission: EM | Admit: 2021-11-07 | Discharge: 2021-11-07 | Disposition: A | Discharge status: Home / Self Care | Payer: Medicaid Other | Attending: Emergency Medicine | Admitting: Emergency Medicine

## 2021-11-07 ENCOUNTER — Emergency Department (HOSPITAL_COMMUNITY): Payer: Medicaid Other

## 2021-11-07 DIAGNOSIS — R591 Generalized enlarged lymph nodes: Secondary | ICD-10-CM

## 2021-11-07 DIAGNOSIS — M79661 Pain in right lower leg: Secondary | ICD-10-CM | POA: Diagnosis present

## 2021-11-07 DIAGNOSIS — M79605 Pain in left leg: Secondary | ICD-10-CM

## 2021-11-07 DIAGNOSIS — M79604 Pain in right leg: Secondary | ICD-10-CM

## 2021-11-07 DIAGNOSIS — Z91010 Allergy to peanuts: Secondary | ICD-10-CM | POA: Insufficient documentation

## 2021-11-07 DIAGNOSIS — M79662 Pain in left lower leg: Secondary | ICD-10-CM | POA: Insufficient documentation

## 2021-11-07 DIAGNOSIS — I5043 Acute on chronic combined systolic (congestive) and diastolic (congestive) heart failure: Secondary | ICD-10-CM | POA: Diagnosis not present

## 2021-11-07 DIAGNOSIS — R609 Edema, unspecified: Secondary | ICD-10-CM | POA: Diagnosis not present

## 2021-11-07 DIAGNOSIS — Z7982 Long term (current) use of aspirin: Secondary | ICD-10-CM | POA: Diagnosis not present

## 2021-11-07 DIAGNOSIS — F17210 Nicotine dependence, cigarettes, uncomplicated: Secondary | ICD-10-CM | POA: Diagnosis not present

## 2021-11-07 DIAGNOSIS — J449 Chronic obstructive pulmonary disease, unspecified: Secondary | ICD-10-CM | POA: Insufficient documentation

## 2021-11-07 DIAGNOSIS — Z79899 Other long term (current) drug therapy: Secondary | ICD-10-CM | POA: Insufficient documentation

## 2021-11-07 DIAGNOSIS — I110 Hypertensive heart disease with heart failure: Secondary | ICD-10-CM | POA: Insufficient documentation

## 2021-11-07 DIAGNOSIS — J45909 Unspecified asthma, uncomplicated: Secondary | ICD-10-CM | POA: Insufficient documentation

## 2021-11-07 DIAGNOSIS — Z91040 Latex allergy status: Secondary | ICD-10-CM | POA: Insufficient documentation

## 2021-11-07 LAB — CBC WITH DIFFERENTIAL/PLATELET
Abs Immature Granulocytes: 0.03 10*3/uL (ref 0.00–0.07)
Basophils Absolute: 0 10*3/uL (ref 0.0–0.1)
Basophils Relative: 0 %
Eosinophils Absolute: 0.1 10*3/uL (ref 0.0–0.5)
Eosinophils Relative: 1 %
HCT: 44.8 % (ref 39.0–52.0)
Hemoglobin: 15.3 g/dL (ref 13.0–17.0)
Immature Granulocytes: 0 %
Lymphocytes Relative: 44 %
Lymphs Abs: 4.5 10*3/uL — ABNORMAL HIGH (ref 0.7–4.0)
MCH: 32.7 pg (ref 26.0–34.0)
MCHC: 34.2 g/dL (ref 30.0–36.0)
MCV: 95.7 fL (ref 80.0–100.0)
Monocytes Absolute: 1 10*3/uL (ref 0.1–1.0)
Monocytes Relative: 9 %
Neutro Abs: 4.7 10*3/uL (ref 1.7–7.7)
Neutrophils Relative %: 46 %
Platelets: 250 10*3/uL (ref 150–400)
RBC: 4.68 MIL/uL (ref 4.22–5.81)
RDW: 13.8 % (ref 11.5–15.5)
WBC: 10.4 10*3/uL (ref 4.0–10.5)
nRBC: 0 % (ref 0.0–0.2)

## 2021-11-07 LAB — BASIC METABOLIC PANEL
Anion gap: 15 (ref 5–15)
BUN: 18 mg/dL (ref 6–20)
CO2: 22 mmol/L (ref 22–32)
Calcium: 9.5 mg/dL (ref 8.9–10.3)
Chloride: 103 mmol/L (ref 98–111)
Creatinine, Ser: 1.16 mg/dL (ref 0.61–1.24)
GFR, Estimated: >60 mL/min (ref >60)
Glucose, Bld: 95 mg/dL (ref 70–99)
Potassium: 4.5 mmol/L (ref 3.5–5.1)
Sodium: 140 mmol/L (ref 135–145)

## 2021-11-07 LAB — BRAIN NATRIURETIC PEPTIDE: B Natriuretic Peptide: 40.6 pg/mL (ref 0.0–100.0)

## 2021-11-07 MED ORDER — CARBAMIDE PEROXIDE 6.5 % OT SOLN
5.0000 [drp] | Freq: Two times a day (BID) | OTIC | Status: DC
  Administered 2021-11-07: 5 [drp] via OTIC
  Filled 2021-11-07: qty 15

## 2021-11-07 MED ORDER — TRAMADOL HCL 50 MG PO TABS: 50.0000 mg | ORAL_TABLET | Freq: Four times a day (QID) | ORAL | 0 refills | Status: DC | PRN

## 2021-11-07 MED ORDER — HYDROMORPHONE HCL 1 MG/ML IJ SOLN
1.0000 mg | Freq: Once | INTRAMUSCULAR | Status: AC
  Administered 2021-11-07: 1 mg via INTRAMUSCULAR
  Filled 2021-11-07: qty 1

## 2021-11-07 MED ORDER — CEPHALEXIN 500 MG PO CAPS: 500.0000 mg | ORAL_CAPSULE | Freq: Three times a day (TID) | ORAL | 0 refills | Status: DC

## 2021-11-07 NOTE — ED Provider Notes (Signed)
[facility] EMERGENCY DEPARTMENT Provider Note   CSN: [id number] Arrival date & time: 11/07/21  1450     History Chief Complaint  Patient presents with   Leg Pain    [name] is a 55 y.o. male.  Presents to ER with concern for leg pain.  Patient states that he has been having bilateral leg pain and swelling over the last few days however pain in the right leg seems to have gotten a lot worse.  Pain is up to 10/10 in severity, starts around the level of his knee and radiates down the rest of his leg into his foot.  Denies any numbness or tingling.  No new weakness.  Denies any falls or trauma.  Patient has extensive past medical history including history of CHF, COPD, diabetes, gastric reflux disease, hypertension.  HPI     Past Medical History:  Diagnosis Date   Asthma    Bipolar 1 disorder (HCC)    CHF (congestive heart failure) (HCC)    COPD (chronic obstructive pulmonary disease) (HCC)    Depression    Diabetes mellitus without complication (HCC)    GERD (gastroesophageal reflux disease)    Homelessness    Hypertension    QT prolongation    Schizoaffective disorder (HCC)     Patient Active Problem List   Diagnosis Date Noted   Antisocial personality disorder (HCC) 11/04/2021   Malingering 11/04/2021   Foot pain 04/23/2021   Acute on chronic combined systolic (congestive) and diastolic (congestive) heart failure (HCC) 03/02/2021   History of stroke 03/02/2021   Substance use disorder 03/02/2021   Skin ulcer of right great toe with necrosis of bone (HCC)    Encounter for assessment of healthcare decision-making capacity    Pain, dental    Dystonia    Dysarthria    Shock liver 01/04/2021   Acute metabolic encephalopathy 01/04/2021   CAD (coronary artery disease) 01/04/2021   HTN (hypertension) 01/04/2021   Bipolar I disorder (HCC) 01/04/2021   Cocaine abuse (HCC) 01/04/2021   Homelessness 01/04/2021   HLD (hyperlipidemia) 01/04/2021    Osteomyelitis of great toe (HCC)    Ulcer of great toe, left, with necrosis of bone (HCC)    Ulcer of great toe, right, with necrosis of muscle (HCC)    Acute osteomyelitis of toe, left (HCC)    Acute on chronic combined systolic and diastolic CHF (congestive heart failure) (HCC) 12/20/2020   Respiratory failure (HCC)    Ischemic stroke (HCC) 12/17/2020   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   Diabetic foot ulcer (HCC) 12/08/2020   CHF (congestive heart failure) (HCC) 11/14/2020   CHF exacerbation (HCC) 11/13/2020   Hypokalemia    Hyperlipidemia    Acute respiratory failure with hypoxia (HCC) 10/26/2020   COPD with acute exacerbation (HCC) 10/14/2020   Acute on chronic combined systolic and diastolic CHF (congestive heart failure) (HCC) 10/02/2020   Near syncope 07/21/2020   Schizoaffective disorder (HCC) 06/24/2019   SOB (shortness of breath)    QT prolongation 05/25/2019   Cocaine abuse (HCC)    Elevated troponin    Homeless    Suicidal ideation    Bilateral foot pain 04/05/2019   Housing situation unstable 03/28/2019   Bilateral lower extremity edema    Asthma 12/26/2016   Tobacco use disorder 12/26/2016   Schizophrenia (HCC) 12/26/2016   Cocaine abuse with cocaine-induced mood disorder (HCC) 09/06/2016   Alcohol use disorder, mild, abuse 07/31/2016   Substance induced mood disorder (HCC) 07/30/2016  Schizoaffective disorder, bipolar type ([place]) 05/31/2016   Cocaine use disorder, severe, dependence ([place]) 05/31/2016   Hypertension 05/31/2016    Past Surgical History:  Procedure Laterality Date   AMPUTATION TOE Bilateral 01/02/2021   Procedure: AMPUTATION TOE;  Surgeon: Evelina Bucy, DPM;  Location: [place];  Service: Podiatry;  Laterality: Bilateral;  Ok to leave patient on hospital bed   IR ANGIO INTRA EXTRACRAN SEL COM CAROTID INNOMINATE BILAT MOD SED  12/17/2020   IR ANGIO VERTEBRAL SEL VERTEBRAL BILAT MOD SED  12/17/2020   RADIOLOGY WITH ANESTHESIA N/A 12/17/2020    Procedure: IR WITH ANESTHESIA;  Surgeon: Radiologist, Medication, MD;  Location: [place];  Service: Radiology;  Laterality: N/A;       Family History  Problem Relation Age of Onset   Drug abuse Mother    Suicidality Mother     Social History   Tobacco Use   Smoking status: Every Day    Years: 30.00    Types: Cigarettes   Smokeless tobacco: Never  Vaping Use   Vaping Use: Never used  Substance Use Topics   Alcohol use: Yes    Comment: infrequently   Drug use: Yes    Types: Cocaine, Marijuana, "Crack" cocaine    Home Medications Prior to Admission medications   Medication Sig Start Date End Date Taking? Authorizing Provider  albuterol (VENTOLIN HFA) 108 (90 Base) MCG/ACT inhaler Inhale 2 puffs into the lungs every 6 (six) hours as needed for wheezing or shortness of breath. Patient not taking: Reported on 11/02/2021 11/16/20   Flora Lipps, MD  aspirin 81 MG chewable tablet Chew 1 tablet (81 mg total) by mouth daily. Patient not taking: Reported on 11/02/2021 02/03/21   Caren Griffins, MD  atorvastatin (LIPITOR) 40 MG tablet TAKE 1 TABLET (40 MG TOTAL) BY MOUTH DAILY. Patient not taking: Reported on 11/02/2021 12/10/20 12/10/21  Lurline Del, DO  carbamide peroxide (DEBROX) 6.5 % OTIC solution Place 5 drops into both ears twice daily. 11/04/21   Starkes-Perry, Gayland Curry, FNP  carvedilol (COREG) 3.125 MG tablet Take 1 tablet (3.125 mg total) by mouth 2 (two) times daily with a meal. Patient not taking: Reported on 11/02/2021 11/16/20   Pokhrel, Corrie Mckusick, MD  furosemide (LASIX) 40 MG tablet Take 1 tablet (40 mg total) by mouth daily. Patient not taking: Reported on 11/02/2021 02/03/21   Caren Griffins, MD  levETIRAcetam (KEPPRA) 500 MG tablet Take 1 tablet (500 mg total) by mouth 2 (two) times daily. Patient not taking: Reported on 11/02/2021 02/03/21   Caren Griffins, MD  OLANZapine (ZYPREXA) 5 MG tablet Take 1 tablet (5 mg total) by mouth at bedtime. 11/04/21 11/04/22  Starkes-Perry,  Gayland Curry, FNP  pregabalin (LYRICA) 100 MG capsule Take 100 mg by mouth 3 (three) times daily. Patient not taking: Reported on 11/02/2021    [provider]  sacubitril-valsartan (ENTRESTO) 24-26 MG Take 1 tablet by mouth 2 (two) times daily. Patient not taking: Reported on 11/02/2021 02/03/21   Caren Griffins, MD  spironolactone (ALDACTONE) 25 MG tablet Take 0.5 tablets (12.5 mg total) by mouth daily. Patient not taking: Reported on 11/02/2021 01/15/21   Samella Parr, NP  traZODone (DESYREL) 50 MG tablet Take 1 tablet (50 mg total) by mouth at bedtime. Patient not taking: Reported on 11/02/2021 11/16/20   Pokhrel, Corrie Mckusick, MD  umeclidinium bromide (INCRUSE ELLIPTA) 62.5 MCG/INH AEPB Inhale 1 puff into the lungs daily. Patient not taking: Reported on 11/02/2021 11/16/20   Pokhrel, [place],  MD    Allergies    Ibuprofen, Acetaminophen, Latex, Lyrica cr [pregabalin er], and Peanut-containing drug products  Review of Systems   Review of Systems  Constitutional:  Negative for chills and fever.  HENT:  Negative for ear pain and sore throat.   Eyes:  Negative for pain and visual disturbance.  Respiratory:  Negative for cough and shortness of breath.   Cardiovascular:  Negative for chest pain and palpitations.  Gastrointestinal:  Negative for abdominal pain and vomiting.  Genitourinary:  Negative for dysuria and hematuria.  Musculoskeletal:  Positive for arthralgias. Negative for back pain.  Skin:  Negative for color change and rash.  Neurological:  Negative for seizures and syncope.  All other systems reviewed and are negative.  Physical Exam Updated Vital Signs BP (!) 145/87   Pulse (!) 106   Temp (!) 97.5 F (36.4 C) (Oral)   Resp (!) 26   SpO2 100%   Physical Exam Vitals and nursing note reviewed.  Constitutional:      General: He is not in acute distress.    Appearance: He is well-developed.  HENT:     Head: Normocephalic and atraumatic.  Eyes:     Conjunctiva/sclera:  Conjunctivae normal.  Cardiovascular:     Rate and Rhythm: Normal rate and regular rhythm.     Heart sounds: No murmur heard. Pulmonary:     Effort: Pulmonary effort is normal. No respiratory distress.     Breath sounds: Normal breath sounds.  Abdominal:     Palpations: Abdomen is soft.     Tenderness: There is no abdominal tenderness.  Musculoskeletal:        General: No swelling.     Cervical back: Neck supple.     Comments:  RLE: Some tenderness to palpation in the lower leg, normal joint ROM, mild nonpitting edema appreciated in lower leg, difficulty obtaining palpable pulses, readily dopplered PT and DP pulses LLE: Some tenderness to palpation in the lower leg, normal joint ROM, mild nonpitting edema appreciated in lower leg, difficulty obtaining palpable pulses, readily dopplered PT and DP pulses  Skin:    General: Skin is warm and dry.     Capillary Refill: Capillary refill takes less than 2 seconds.  Neurological:     Mental Status: He is alert.  Psychiatric:        Mood and Affect: Mood normal.    ED Results / Procedures / Treatments   Labs (all labs ordered are listed, but only abnormal results are displayed) Labs Reviewed  CBC WITH DIFFERENTIAL/PLATELET  BASIC METABOLIC PANEL  BRAIN NATRIURETIC PEPTIDE    EKG None  Radiology No results found.  Procedures Procedures   Medications Ordered in ED Medications  HYDROmorphone (DILAUDID) injection 1 mg (1 mg Intramuscular Given 11/07/21 1530)    ED Course  I have reviewed the triage vital signs and the nursing notes.  Pertinent labs & imaging results that were available during my care of the patient were reviewed by me and considered in my medical decision making (see chart for details).    MDM Rules/Calculators/A&P                           55 year old gentleman with extensive past medical history presenting to the emergency room with concern for bilateral lower leg pain and swelling.  On exam I did  appreciate some mild swelling and some tenderness in his lower leg but no obvious deformity was appreciated.  Had difficulty palpating pulses however was able to readily Doppler pulses.  Will check basic labs, BNP level, as well as vascular studies for DVT and ABI.  Provided Dilaudid for pain control.  While awaiting additional work-up and reassessment, signed out to Dr. Tomi Bamberger.  Final Clinical Impression(s) / ED Diagnoses Final diagnoses:  Pain in both lower extremities    Rx / DC Orders ED Discharge Orders     None        Lucrezia Starch, MD 11/07/21 1621

## 2021-11-07 NOTE — ED Provider Notes (Signed)
Pt with bilateral leg pain.  Hx of same.  Has seen vascular surgery in the past for this issue, not found to have significant PVD.  ED workup reassuring.  CBC and metabolic panel normal.  No dvt.  SOme lymphadenopathy noted.  Unable to do ABI due to pt compliance however noted to have distal flow.  On exam he does have some increased warmth on the right lower extremity compared to the left.  He does find that the pain in the right leg is worse than the other.  With the lymphadenopathy I wonder if he could have a component of cellulitis although this very well may be neuropathy considering the bilateral nature of his symptoms.  He does have diabetes.  No signs of any indication for admission at this time.  Patient is feeling better.     Linwood Dibbles, MD 11/07/21 [phone]

## 2021-11-07 NOTE — ED Notes (Signed)
Provided pt with cab voucher per Dr. Lynelle Doctor and Charge RN Teodora Medici. Called [facility] Taxi service to arrange pickup for pt.

## 2021-11-07 NOTE — Progress Notes (Incomplete)
{  Select Note:[id number]} 

## 2021-11-07 NOTE — Progress Notes (Signed)
BLE venous duplex has been completed.  Results can be found under chart review under CV PROC. 11/07/2021 5:28 PM Keithon Mccoin RVT, RDMS

## 2021-11-07 NOTE — ED Notes (Signed)
ED Provider at bedside. Sandwich and drink given to patient.

## 2021-11-07 NOTE — ED Triage Notes (Signed)
Homeless, was seen at [facility] 5 days ago for same situation,hx CHF and HTN, cannot access medications, was given prescription for lasix. Pain/swelling/tightness legs bilaterally.

## 2021-11-07 NOTE — Discharge Instructions (Signed)
Take the medications as needed for pain.  Follow-up with your doctor to be rechecked. 

## 2021-11-09 ENCOUNTER — Other Ambulatory Visit (HOSPITAL_COMMUNITY): Payer: Self-pay

## 2021-11-09 ENCOUNTER — Other Ambulatory Visit: Payer: Self-pay

## 2021-11-09 ENCOUNTER — Emergency Department (HOSPITAL_COMMUNITY)
Admission: EM | Admit: 2021-11-09 | Discharge: 2021-11-09 | Disposition: A | Discharge status: Home / Self Care | Payer: Medicaid Other | Attending: Emergency Medicine | Admitting: Emergency Medicine

## 2021-11-09 DIAGNOSIS — F17210 Nicotine dependence, cigarettes, uncomplicated: Secondary | ICD-10-CM | POA: Insufficient documentation

## 2021-11-09 DIAGNOSIS — Z79899 Other long term (current) drug therapy: Secondary | ICD-10-CM | POA: Insufficient documentation

## 2021-11-09 DIAGNOSIS — F1990 Other psychoactive substance use, unspecified, uncomplicated: Secondary | ICD-10-CM

## 2021-11-09 DIAGNOSIS — Z7951 Long term (current) use of inhaled steroids: Secondary | ICD-10-CM | POA: Diagnosis not present

## 2021-11-09 DIAGNOSIS — I5043 Acute on chronic combined systolic (congestive) and diastolic (congestive) heart failure: Secondary | ICD-10-CM | POA: Insufficient documentation

## 2021-11-09 DIAGNOSIS — Z91010 Allergy to peanuts: Secondary | ICD-10-CM | POA: Insufficient documentation

## 2021-11-09 DIAGNOSIS — F1410 Cocaine abuse, uncomplicated: Secondary | ICD-10-CM

## 2021-11-09 DIAGNOSIS — Z5900 Homelessness unspecified: Secondary | ICD-10-CM

## 2021-11-09 DIAGNOSIS — I110 Hypertensive heart disease with heart failure: Secondary | ICD-10-CM | POA: Insufficient documentation

## 2021-11-09 DIAGNOSIS — J45909 Unspecified asthma, uncomplicated: Secondary | ICD-10-CM | POA: Insufficient documentation

## 2021-11-09 DIAGNOSIS — I2510 Atherosclerotic heart disease of native coronary artery without angina pectoris: Secondary | ICD-10-CM | POA: Insufficient documentation

## 2021-11-09 DIAGNOSIS — L03115 Cellulitis of right lower limb: Secondary | ICD-10-CM | POA: Insufficient documentation

## 2021-11-09 DIAGNOSIS — R2241 Localized swelling, mass and lump, right lower limb: Secondary | ICD-10-CM | POA: Diagnosis present

## 2021-11-09 DIAGNOSIS — J441 Chronic obstructive pulmonary disease with (acute) exacerbation: Secondary | ICD-10-CM | POA: Diagnosis not present

## 2021-11-09 DIAGNOSIS — E119 Type 2 diabetes mellitus without complications: Secondary | ICD-10-CM | POA: Insufficient documentation

## 2021-11-09 MED ORDER — ALBUTEROL SULFATE HFA 108 (90 BASE) MCG/ACT IN AERS
2.0000 | INHALATION_SPRAY | Freq: Once | RESPIRATORY_TRACT | Status: AC
  Administered 2021-11-09: 2 via RESPIRATORY_TRACT
  Filled 2021-11-09: qty 6.7

## 2021-11-09 MED ORDER — TRAMADOL HCL 50 MG PO TABS
50.0000 mg | ORAL_TABLET | Freq: Two times a day (BID) | ORAL | 0 refills | Status: DC | PRN
  Filled 2021-11-09: qty 10, 5d supply, fill #0

## 2021-11-09 MED ORDER — CARVEDILOL 3.125 MG PO TABS
3.1250 mg | ORAL_TABLET | Freq: Two times a day (BID) | ORAL | Status: DC
  Administered 2021-11-09: 3.125 mg via ORAL
  Filled 2021-11-09: qty 1

## 2021-11-09 MED ORDER — TRAZODONE HCL 50 MG PO TABS
50.0000 mg | ORAL_TABLET | Freq: Every day | ORAL | 2 refills | Status: DC
  Filled 2021-11-09: qty 30, 30d supply, fill #0

## 2021-11-09 MED ORDER — ALBUTEROL SULFATE HFA 108 (90 BASE) MCG/ACT IN AERS
2.0000 | INHALATION_SPRAY | Freq: Four times a day (QID) | RESPIRATORY_TRACT | 2 refills | Status: DC | PRN
Start: 2021-11-09 — End: 2022-03-07
  Filled 2021-11-09: qty 8.5, 30d supply, fill #0

## 2021-11-09 MED ORDER — FUROSEMIDE 40 MG PO TABS
40.0000 mg | ORAL_TABLET | Freq: Every day | ORAL | 0 refills | Status: DC
  Filled 2021-11-09: qty 30, 30d supply, fill #0

## 2021-11-09 MED ORDER — TRAMADOL HCL 50 MG PO TABS
50.0000 mg | ORAL_TABLET | Freq: Once | ORAL | Status: AC
  Administered 2021-11-09: 50 mg via ORAL
  Filled 2021-11-09: qty 1

## 2021-11-09 MED ORDER — OLANZAPINE 5 MG PO TABS
5.0000 mg | ORAL_TABLET | Freq: Every day | ORAL | 3 refills | Status: DC
  Filled 2021-11-09: qty 30, 30d supply, fill #0

## 2021-11-09 MED ORDER — MORPHINE SULFATE (PF) 4 MG/ML IV SOLN: 4.0000 mg | Freq: Once | INTRAVENOUS | Status: DC | PRN

## 2021-11-09 MED ORDER — CARVEDILOL 3.125 MG PO TABS
3.1250 mg | ORAL_TABLET | Freq: Two times a day (BID) | ORAL | 0 refills | Status: DC
  Filled 2021-11-09 (×2): qty 60, 30d supply, fill #0

## 2021-11-09 MED ORDER — ATORVASTATIN CALCIUM 10 MG PO TABS
40.0000 mg | ORAL_TABLET | Freq: Every day | ORAL | 0 refills | Status: DC
  Filled 2021-11-09: qty 120, 30d supply, fill #0

## 2021-11-09 MED ORDER — SPIRONOLACTONE 25 MG PO TABS
12.5000 mg | ORAL_TABLET | Freq: Every day | ORAL | 0 refills | Status: DC
  Filled 2021-11-09: qty 15, 30d supply, fill #0

## 2021-11-09 MED ORDER — SACUBITRIL-VALSARTAN 24-26 MG PO TABS
1.0000 | ORAL_TABLET | Freq: Two times a day (BID) | ORAL | 0 refills | Status: AC
  Filled 2021-11-09: qty 60, 30d supply, fill #0

## 2021-11-09 MED ORDER — LEVETIRACETAM 500 MG PO TABS
500.0000 mg | ORAL_TABLET | Freq: Two times a day (BID) | ORAL | 0 refills | Status: DC
  Filled 2021-11-09: qty 60, 30d supply, fill #0

## 2021-11-09 MED ORDER — DEXTROSE 5 % IV SOLN
1500.0000 mg | Freq: Once | INTRAVENOUS | Status: AC
  Administered 2021-11-09: 1500 mg via INTRAVENOUS
  Filled 2021-11-09: qty 75

## 2021-11-09 MED ORDER — MORPHINE SULFATE (PF) 4 MG/ML IV SOLN
4.0000 mg | Freq: Once | INTRAVENOUS | Status: AC
  Administered 2021-11-09: 4 mg via INTRAVENOUS
  Filled 2021-11-09: qty 1

## 2021-11-09 NOTE — ED Notes (Signed)
Pt provided with ham sandwich 

## 2021-11-09 NOTE — Discharge Instructions (Addendum)
[facility] Medication Assistance Card Name:  [name] ID: [id number] Bin: [id number] RX Group: [id number] Discharge Date:  11/09/2021 Expiration Date:  11/15/2021 (must be filled within 7 days of discharge) Dear __________[name]__________  [name] have been approved to have the prescriptions written by your discharging physician filled through our [facility] (Medication Assistance Through [facility]) program. This program allows for a one-time (no refills) 34-day supply of selected medications for a low copay amount.  The copay is $3.00 per prescription. For instance, if you have one prescription, you will pay $3.00; for two prescriptions, you pay $6.00; for three prescriptions, you pay $9.00; and so on.  Only certain pharmacies are participating in this program with [facility]. You will need to select one of the pharmacies from the attached list and take your prescriptions, this letter, and your photo ID to one of the participating pharmacies.   We are excited that you are able to use the [facility] program to get your medications. These prescriptions must be filled within 7 days of hospital discharge or they will no longer be valid for the [facility] program. Should you have any problems with your prescriptions please contact your case management team member at [phone] for [facility].  Thank you, [name] Crease DNP, MSN, RN [phone]   [facility]    [place] - Locations  [facility]        [address], [place], [name]  [facility]    [address] [place], [place], [name]   [facility]        [address], [place], [place]  [facility]       [address] [place], [place], [name]  Other [facility] [address], [place], [name]  [facility]                                                                   [name], [place], [name]  [facility]          [address], [place], [name]  [facility] [address], [place], [name]   [address] [place], [place], [name]  [address] [place], [name]  [address], [place], [name]   [address], [place], [name]   [address] [place], [name]   [address] [place], [name] [address], [place], [name] [address], [place], [name]    [facility] [address], [place], [name]  [address], [place], [name] [address], [place], [name]  [address], [place], [name] [address], [place], [name] [address], [place], [name] [address], [place], [name]  [address], [place], [name] [address] [place], [place], [name] [address], [place], [name] [address], [name]   [address], [place], [name] [address], [place], [name] [address], [place], [name] [address] [name]   [address] [place], [name]   [address], [place], [name] [address] [place]  [address], [place], [name]  [address] [place], [name]  [facility] [address] [place], [place], [name] [address] [place], [place], [name] [address] [place], [name] [address], [place], [name] [address], [place], [name]  [address] [place], [place], [name] [address], [place], [name] [address] [name] [address] [place], [place], [name] [address], [place], [name] [address] [place], [name] [address] [place], [place], [name] [address], [place], [name] (effective 05/31/17) [address], [place], [name] (effective 05/31/17)
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
## 2021-11-09 NOTE — ED Triage Notes (Signed)
Patient [name], reports leg pain x3 days, history of chf with BLE edema, patient reports pain 10/10 in BLE.

## 2021-11-09 NOTE — ED Notes (Signed)
Pt asked for a cab voucher and provided an address while Aura Camps was wheeling pt to the lobby.

## 2021-11-09 NOTE — ED Notes (Signed)
Dr. Renaye Rakers was notified re: pt hard stick, IV team was not able to draw labs. MD acknowledged.

## 2021-11-09 NOTE — ED Provider Notes (Signed)
[facility] DEPT Provider Note   CSN: [id number] Arrival date & time: 11/09/21  1006     History Chief Complaint  Patient presents with   Leg Pain    [name] is a 55 y.o. male w/ multiple medical comorbidities presenting to the ED with complaint of leg swelling.  The patient is homeless and reports continued difficulty with filling prescriptions.  He was seen in the emergency department 2 days ago for lower extremity swelling.  He had basic blood work that, which was unremarkable, and treated with Keflex for suspected cellulitis of the right lower extremity, which was felt to be warmer than the other extremity.  He says he was unable to fill the prescription.  He has not been taking any antibiotics.  He is also supposed to be on chronic Lasix but is not able to afford to fill this prescription.  He reports the pain is 10 out of 10 in his legs.  He said that he is essentially wheelchair-bound.  He also reports he has been wheezing recently.  HPI     Past Medical History:  Diagnosis Date   Asthma    Bipolar 1 disorder (HCC)    CHF (congestive heart failure) (HCC)    COPD (chronic obstructive pulmonary disease) ([place])    Depression    Diabetes mellitus without complication (HCC)    GERD (gastroesophageal reflux disease)    Homelessness    Hypertension    QT prolongation    Schizoaffective disorder (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[name]) 11/04/2021   Malingering 11/04/2021   Foot pain 04/23/2021   Acute on chronic combined systolic (congestive) and diastolic (congestive) heart failure ([place]) 03/02/2021   History of stroke 03/02/2021   Substance use disorder 03/02/2021   Skin ulcer of right great toe with necrosis of bone ([place])    Encounter for assessment of healthcare decision-making capacity    Pain, dental    Dystonia    Dysarthria    Shock liver [id number]   Acute metabolic  encephalopathy [id number]   CAD (coronary artery disease) 01/04/2021   HTN (hypertension) 01/04/2021   Bipolar I disorder ([place]) 01/04/2021   Cocaine abuse ([place]) 01/04/2021   Homelessness 01/04/2021   HLD (hyperlipidemia) 01/04/2021   Osteomyelitis of great toe (HCC)    Ulcer of great toe, left, with necrosis of bone (HCC)    Ulcer of great toe, right, with necrosis of muscle (HCC)    Acute osteomyelitis of toe, left (HCC)    Acute on chronic combined systolic and diastolic CHF (congestive heart failure) ([place]) 12/20/2020   Respiratory failure ([place])    Ischemic stroke ([place]) 12/17/2020   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   Diabetic foot ulcer ([place]) 12/08/2020   CHF (congestive heart failure) ([place]) 11/14/2020   CHF exacerbation ([place]) 11/13/2020   Hypokalemia    Hyperlipidemia    Acute respiratory failure with hypoxia ([place]) 10/26/2020   COPD with acute exacerbation ([place]) 10/14/2020   Acute on chronic combined systolic and diastolic CHF (congestive heart failure) ([place]) 10/02/2020   Near syncope 07/21/2020   Schizoaffective disorder ([place]) 06/24/2019   SOB (shortness of breath)    QT prolongation 05/25/2019   Cocaine abuse ([place])    Elevated troponin    Homeless    Suicidal ideation    Bilateral foot pain 04/05/2019   Housing situation unstable 03/28/2019   Bilateral lower extremity edema  Asthma 12/26/2016   Tobacco use disorder 12/26/2016   Schizophrenia ([place]) 12/26/2016   Cocaine abuse with cocaine-induced mood disorder ([place]) 09/06/2016   Alcohol use disorder, mild, abuse 07/31/2016   Substance induced mood disorder ([place]) 07/30/2016   Schizoaffective disorder, bipolar type ([place]) 05/31/2016   Cocaine use disorder, severe, dependence ([place]) 05/31/2016   Hypertension 05/31/2016    Past Surgical History:  Procedure Laterality Date   AMPUTATION TOE Bilateral 01/02/2021   Procedure: AMPUTATION TOE;  Surgeon: Evelina Bucy, DPM;  Location: [facility];  Service: Podiatry;  Laterality:  Bilateral;  Ok to leave patient on hospital bed   IR ANGIO INTRA EXTRACRAN SEL COM CAROTID INNOMINATE BILAT MOD SED  12/17/2020   IR ANGIO VERTEBRAL SEL VERTEBRAL BILAT MOD SED  12/17/2020   RADIOLOGY WITH ANESTHESIA N/A 12/17/2020   Procedure: IR WITH ANESTHESIA;  Surgeon: Radiologist, Medication, MD;  Location: [facility];  Service: Radiology;  Laterality: N/A;       Family History  Problem Relation Age of Onset   Drug abuse Mother    Suicidality Mother     Social History   Tobacco Use   Smoking status: Every Day    Years: 30.00    Types: Cigarettes   Smokeless tobacco: Never  Vaping Use   Vaping Use: Never used  Substance Use Topics   Alcohol use: Yes    Comment: infrequently   Drug use: Yes    Types: Cocaine, Marijuana, "Crack" cocaine    Home Medications Prior to Admission medications   Medication Sig Start Date End Date Taking? Authorizing Provider  atorvastatin (LIPITOR) 10 MG tablet Take 4 tablets (40 mg total) by mouth daily. 11/09/21  Yes Wyvonnia Dusky, MD  albuterol (VENTOLIN HFA) 108 (90 Base) MCG/ACT inhaler Inhale 2 puffs into the lungs every 6 (six) hours as needed for wheezing or shortness of breath. 11/09/21   Wyvonnia Dusky, MD  carbamide peroxide (DEBROX) 6.5 % OTIC solution Place 5 drops into both ears twice daily. Patient not taking: Reported on 11/09/2021 11/04/21   Suella Broad, FNP  carvedilol (COREG) 3.125 MG tablet Take 1 tablet (3.125 mg total) by mouth 2 (two) times daily with a meal. 11/09/21   Chris Narasimhan, Carola Rhine, MD  furosemide (LASIX) 40 MG tablet Take 1 tablet (40 mg total) by mouth daily for 30 doses. 11/09/21 12/09/21  Wyvonnia Dusky, MD  levETIRAcetam (KEPPRA) 500 MG tablet Take 1 tablet (500 mg total) by mouth 2 (two) times daily. 11/09/21 12/09/21  Wyvonnia Dusky, MD  OLANZapine (ZYPREXA) 5 MG tablet Take 1 tablet (5 mg total) by mouth at bedtime. 11/09/21   Wyvonnia Dusky, MD  pregabalin (LYRICA) 100 MG capsule Take 100 mg by  mouth 3 (three) times daily. Patient not taking: Reported on 11/02/2021    [provider]  sacubitril-valsartan (ENTRESTO) 24-26 MG Take 1 tablet by mouth 2 (two) times daily. 11/09/21 12/09/21  Wyvonnia Dusky, MD  spironolactone (ALDACTONE) 25 MG tablet Take 1/2 tablets (12.5 mg total) by mouth daily. 11/09/21   Wyvonnia Dusky, MD  traMADol (ULTRAM) 50 MG tablet Take 1 tablet (50 mg total) by mouth every 12 (twelve) hours as needed for up to 10 doses. 11/09/21   Wyvonnia Dusky, MD  traZODone (DESYREL) 50 MG tablet Take 1 tablet (50 mg total) by mouth at bedtime. 11/09/21   Wyvonnia Dusky, MD  umeclidinium bromide (INCRUSE ELLIPTA) 62.5 MCG/INH AEPB Inhale 1 puff into the lungs daily. Patient  not taking: Reported on 11/02/2021 11/16/20   Flora Lipps, MD    Allergies    Ibuprofen, Acetaminophen, Lyrica cr [pregabalin er], and Peanut-containing drug products  Review of Systems   Review of Systems  Constitutional:  Negative for chills and fever.  Eyes:  Negative for pain and visual disturbance.  Respiratory:  Positive for cough and shortness of breath.   Cardiovascular:  Negative for chest pain and palpitations.  Gastrointestinal:  Negative for abdominal pain and vomiting.  Genitourinary:  Negative for dysuria and hematuria.  Musculoskeletal:  Positive for arthralgias and myalgias.  Skin:  Negative for color change and rash.  Neurological:  Positive for headaches. Negative for syncope.  All other systems reviewed and are negative.  Physical Exam Updated Vital Signs BP (!) 163/96   Pulse 92   Temp 98 F (36.7 C) (Oral)   Resp 19   Ht 5\' 9"  (1.753 m)   Wt 86.2 kg   SpO2 99%   BMI 28.06 kg/m   Physical Exam Constitutional:      General: He is not in acute distress. HENT:     Head: Normocephalic and atraumatic.  Eyes:     Conjunctiva/sclera: Conjunctivae normal.     Pupils: Pupils are equal, round, and reactive to light.  Cardiovascular:     Rate and  Rhythm: Normal rate and regular rhythm.  Pulmonary:     Effort: Pulmonary effort is normal. No respiratory distress.     Comments: Wheezing, mild expiratory Abdominal:     General: There is no distension.     Tenderness: There is no abdominal tenderness.  Musculoskeletal:     Comments: Significant pitting edema of lower extremities, weeping Warmth of right lower extremity  Skin:    General: Skin is warm and dry.  Neurological:     General: No focal deficit present.     Mental Status: He is alert. Mental status is at baseline.    ED Results / Procedures / Treatments   Labs (all labs ordered are listed, but only abnormal results are displayed) Labs Reviewed - No data to display  EKG None  Radiology VAS [place] LOWER EXTREMITY VENOUS (DVT) (7a-7p)  Result Date: 11/07/2021  Lower Venous DVT Study Patient Name:  COUREY MOTA  Date of Exam:   11/07/2021 Medical Rec #: [id number]       Accession #:    [id number] Date of Birth: 1966/01/03       Patient Gender: M Patient Age:   78 years Exam Location:  [facility] Procedure:      VAS [place] LOWER EXTREMITY VENOUS (DVT) Referring Phys: Madalyn Rob --------------------------------------------------------------------------------  Indications: Pain, and Edema.  Comparison Study: Previous exam 03/02/2021 was negative for DVT. Performing Technologist: Rogelia Rohrer RVT, RDMS  Examination Guidelines: A complete evaluation includes B-mode imaging, spectral Doppler, color Doppler, and power Doppler as needed of all accessible portions of each vessel. Bilateral testing is considered an integral part of a complete examination. Limited examinations for reoccurring indications may be performed as noted. The reflux portion of the exam is performed with the patient in reverse Trendelenburg.  +---------+---------------+---------+-----------+----------+--------------+ RIGHT    CompressibilityPhasicitySpontaneityPropertiesThrombus Aging  +---------+---------------+---------+-----------+----------+--------------+ CFV      Full           Yes      Yes                                 +---------+---------------+---------+-----------+----------+--------------+  SFJ      Full                                                        +---------+---------------+---------+-----------+----------+--------------+ FV Prox  Full           Yes      Yes                                 +---------+---------------+---------+-----------+----------+--------------+ FV Mid   Full           Yes      Yes                                 +---------+---------------+---------+-----------+----------+--------------+ FV DistalFull           Yes      Yes                                 +---------+---------------+---------+-----------+----------+--------------+ PFV      Full                                                        +---------+---------------+---------+-----------+----------+--------------+ POP      Full           Yes      Yes                                 +---------+---------------+---------+-----------+----------+--------------+ PTV      Full                                                        +---------+---------------+---------+-----------+----------+--------------+ PERO     Full                                                        +---------+---------------+---------+-----------+----------+--------------+ Flow noted in distal porion of PTA, ATA, and PERO A.  +---------+---------------+---------+-----------+----------+--------------+ LEFT     CompressibilityPhasicitySpontaneityPropertiesThrombus Aging +---------+---------------+---------+-----------+----------+--------------+ CFV      Full           Yes      Yes                                 +---------+---------------+---------+-----------+----------+--------------+ SFJ      Full                                                         +---------+---------------+---------+-----------+----------+--------------+  FV Prox  Full           Yes      Yes                                 +---------+---------------+---------+-----------+----------+--------------+ FV Mid   Full           Yes      Yes                                 +---------+---------------+---------+-----------+----------+--------------+ FV DistalFull           Yes      Yes                                 +---------+---------------+---------+-----------+----------+--------------+ PFV      Full                                                        +---------+---------------+---------+-----------+----------+--------------+ POP      Full           Yes      Yes                                 +---------+---------------+---------+-----------+----------+--------------+ PTV      Full                                                        +---------+---------------+---------+-----------+----------+--------------+ PERO     Partial                                                     +---------+---------------+---------+-----------+----------+--------------+ Flow noted in distal porion of PTA, ATA, and PERO A.    Summary: BILATERAL: - No evidence of deep vein thrombosis seen in the lower extremities, bilaterally. - No evidence of superficial venous thrombosis in the lower extremities, bilaterally. -No evidence of popliteal cyst, bilaterally. RIGHT: - Ultrasound characteristics of enlarged lymph nodes are noted in the groin.  LEFT: - Ultrasound characteristics of enlarged lymph nodes noted in the groin.  *See table(s) above for measurements and observations.    Preliminary     Procedures Procedures   Medications Ordered in ED Medications  carvedilol (COREG) tablet 3.125 mg (3.125 mg Oral Given 11/09/21 1130)  dalbavancin (DALVANCE) 1,500 mg in dextrose 5 % 500 mL IVPB (1,500 mg Intravenous New Bag/Given 11/09/21 1547)  morphine 4 MG/ML  injection 4 mg (has no administration in time range)  morphine 4 MG/ML injection 4 mg (4 mg Intravenous Given 11/09/21 1333)  albuterol (VENTOLIN HFA) 108 (90 Base) MCG/ACT inhaler 2 puff (2 puffs Inhalation Given 11/09/21 1130)  traMADol (ULTRAM) tablet 50 mg (50 mg Oral Given 11/09/21 1130)    ED Course  I have reviewed the triage vital  signs and the nursing notes.  Pertinent labs & imaging results that were available during my care of the patient were reviewed by me and considered in my medical decision making (see chart for details).  Patient is here with complaint of continued leg swelling and pain.  The right extremity does appear warmer and perhaps slightly more red than the left extremity.  He was not able to fill or initiate any, treatment for cellulitis, from his evaluation 2 days ago.   I will also give him albuterol puffs for his wheezing.  IV pain medication ordered.  Clinical Course as of 11/09/21 1615  Sat Nov 09, 2021  1359 IV team placed line but cannot draw blood.  I think labs from 2 days ago are recent enough for referral - I spoke to pharmacist about single dose IV dalbavancin here given the patient's difficult social situation and inability to get and take his medications regularly.  We will infuse here.  SW was able to provide [facility] for med refill once, which can be sent to [facility] outpatient pharmacy [MT]  1452 Patient updated regarding plan of action for IV antibiotics and then discharged with his medications and hand.  We can also offer to Ace wrap his legs. I made it clear to him that we cannot keep him for placement, and that the ED cannot function as a placement center.  Resources had been provided for shelters on prior ED visit.   [MT]    Clinical Course User Index [MT] Wilfrid Hyser, Kermit Balo, MD    Final Clinical Impression(s) / ED Diagnoses Final diagnoses:  Cellulitis of right lower extremity    Rx / DC Orders ED Discharge Orders          Ordered    Ambulatory  referral to Infectious Disease       Comments: Cellulitis patient:  Received dalbavancin on 11/09/2021.   11/09/21 1359    albuterol (VENTOLIN HFA) 108 (90 Base) MCG/ACT inhaler  Every 6 hours PRN        11/09/21 1417    atorvastatin (LIPITOR) 10 MG tablet  Daily        11/09/21 1417    furosemide (LASIX) 40 MG tablet  Daily        11/09/21 1417    levETIRAcetam (KEPPRA) 500 MG tablet  2 times daily        11/09/21 1417    OLANZapine (ZYPREXA) 5 MG tablet  Daily at bedtime        11/09/21 1417    sacubitril-valsartan (ENTRESTO) 24-26 MG  2 times daily        11/09/21 1417    spironolactone (ALDACTONE) 25 MG tablet  Daily        11/09/21 1417    traMADol (ULTRAM) 50 MG tablet  Every 12 hours PRN        11/09/21 1417    traZODone (DESYREL) 50 MG tablet  Daily at bedtime        11/09/21 1417    carvedilol (COREG) 3.125 MG tablet  2 times daily with meals        11/09/21 1418             Terald Sleeper, MD 11/09/21 1615

## 2021-11-09 NOTE — TOC Initial Note (Addendum)
Transition of Care [facility]) - Initial/Assessment Note    Patient Details  Name: [name] MRN: [id number] Date of Birth: 07-17-1966  Transition of [facility] CM/SW Contact:    Lockie Pares, RN Phone Number: 11/09/2021, 12:12 PM  Clinical Narrative:                  Patient presented with leg swelling. [facility] out [facility] from [facility] in August has been "Living on the streets" since that time. Did not follow up with PCP recommended. He needs to transfer his out of state medicaid from [facility] to [facility] they can assist with this at [facility]. Worked with CSW at [facility] in regards to this patient. MATCH reinstated so he can obtain needed medication.  1430 MATCH medications overridden for 0 co=pay, called into [facility] pharmacy with MATCH information      Patient Goals and CMS Choice        Expected Discharge Plan and Services                                                Prior Living Arrangements/Services                       Activities of Daily Living      Permission Sought/Granted                  Emotional Assessment              Admission diagnosis:  leg pain Patient Active Problem List   Diagnosis Date Noted   Antisocial personality disorder (HCC) 11/04/2021   Malingering 11/04/2021   Foot pain 04/23/2021   Acute on chronic combined systolic (congestive) and diastolic (congestive) heart failure (HCC) 03/02/2021   History of stroke 03/02/2021   Substance use disorder 03/02/2021   Skin ulcer of right great toe with necrosis of bone (HCC)    Encounter for assessment of healthcare decision-making capacity    Pain, dental    Dystonia    Dysarthria    Shock liver 01/04/2021   Acute metabolic encephalopathy 01/04/2021   CAD (coronary artery disease) 01/04/2021   HTN (hypertension) 01/04/2021   Bipolar I disorder (HCC) 01/04/2021   Cocaine abuse (HCC) 01/04/2021   Homelessness 01/04/2021   HLD (hyperlipidemia) 01/04/2021   Osteomyelitis of  great toe (HCC)    Ulcer of great toe, left, with necrosis of bone (HCC)    Ulcer of great toe, right, with necrosis of muscle (HCC)    Acute osteomyelitis of toe, left (HCC)    Acute on chronic combined systolic and diastolic CHF (congestive heart failure) (HCC) 12/20/2020   Respiratory failure (HCC)    Ischemic stroke (HCC) 12/17/2020   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   Diabetic foot ulcer (HCC) 12/08/2020   CHF (congestive heart failure) (HCC) 11/14/2020   CHF exacerbation (HCC) 11/13/2020   Hypokalemia    Hyperlipidemia    Acute respiratory failure with hypoxia (HCC) 10/26/2020   COPD with acute exacerbation (HCC) 10/14/2020   Acute on chronic combined systolic and diastolic CHF (congestive heart failure) (HCC) 10/02/2020   Near syncope 07/21/2020   Schizoaffective disorder (HCC) 06/24/2019   SOB (shortness of breath)    QT prolongation 05/25/2019   Cocaine abuse (HCC)    Elevated troponin    Homeless    Suicidal ideation  Bilateral foot pain 04/05/2019   Housing situation unstable 03/28/2019   Bilateral lower extremity edema    Asthma 12/26/2016   Tobacco use disorder 12/26/2016   Schizophrenia (HCC) 12/26/2016   Cocaine abuse with cocaine-induced mood disorder (HCC) 09/06/2016   Alcohol use disorder, mild, abuse 07/31/2016   Substance induced mood disorder (HCC) 07/30/2016   Schizoaffective disorder, bipolar type (HCC) 05/31/2016   Cocaine use disorder, severe, dependence (HCC) 05/31/2016   Hypertension 05/31/2016   PCP:  Pcp, No Pharmacy:   [facility] - [place], [name] - [address] [name] [address] Phone: [phone] Fax: [phone]  [facility] [address] [name] [address] Phone: [phone] Fax: [phone]  [id number] - [place], [name] - [address] AT [facility] [address] [name] [address] Phone:  [phone] Fax: [phone]     Social Determinants of Health (SDOH) Interventions    Readmission Risk Interventions Readmission Risk Prevention Plan 03/05/2021 01/07/2021 12/10/2020  Transportation Screening Complete Complete Complete  PCP or Specialist Appt within 3-5 Days - Complete -  HRI or Home Care Consult - Complete -  Social Work Consult for Recovery Care Planning/Counseling - Complete -  Palliative Care Screening - Complete -  Medication Review Oceanographer) Complete Complete Referral to Pharmacy  PCP or Specialist appointment within 3-5 days of discharge Complete - Complete  HRI or Home Care Consult Complete - Complete  SW Recovery Care/Counseling Consult Complete - Complete  Palliative Care Screening Not Applicable - Not Applicable  Skilled Nursing Facility Complete - Not Applicable  Some recent data might be hidden

## 2021-11-13 ENCOUNTER — Other Ambulatory Visit: Payer: Self-pay

## 2021-11-13 ENCOUNTER — Encounter (HOSPITAL_COMMUNITY): Payer: Self-pay

## 2021-11-13 ENCOUNTER — Inpatient Hospital Stay (HOSPITAL_COMMUNITY)
Admission: EM | Admit: 2021-11-13 | Discharge: 2021-11-21 | DRG: 602 | Disposition: A | Discharge status: Home / Self Care | Payer: Medicaid - Out of State | Attending: Internal Medicine | Admitting: Internal Medicine

## 2021-11-13 ENCOUNTER — Emergency Department (HOSPITAL_COMMUNITY): Payer: Medicaid - Out of State

## 2021-11-13 DIAGNOSIS — F319 Bipolar disorder, unspecified: Secondary | ICD-10-CM | POA: Diagnosis present

## 2021-11-13 DIAGNOSIS — Z79899 Other long term (current) drug therapy: Secondary | ICD-10-CM

## 2021-11-13 DIAGNOSIS — Z886 Allergy status to analgesic agent status: Secondary | ICD-10-CM

## 2021-11-13 DIAGNOSIS — L03115 Cellulitis of right lower limb: Principal | ICD-10-CM

## 2021-11-13 DIAGNOSIS — E785 Hyperlipidemia, unspecified: Secondary | ICD-10-CM | POA: Diagnosis present

## 2021-11-13 DIAGNOSIS — F1410 Cocaine abuse, uncomplicated: Secondary | ICD-10-CM | POA: Diagnosis present

## 2021-11-13 DIAGNOSIS — Z813 Family history of other psychoactive substance abuse and dependence: Secondary | ICD-10-CM

## 2021-11-13 DIAGNOSIS — E1142 Type 2 diabetes mellitus with diabetic polyneuropathy: Secondary | ICD-10-CM | POA: Diagnosis present

## 2021-11-13 DIAGNOSIS — F17210 Nicotine dependence, cigarettes, uncomplicated: Secondary | ICD-10-CM | POA: Diagnosis present

## 2021-11-13 DIAGNOSIS — E119 Type 2 diabetes mellitus without complications: Secondary | ICD-10-CM

## 2021-11-13 DIAGNOSIS — Z5900 Homelessness unspecified: Secondary | ICD-10-CM

## 2021-11-13 DIAGNOSIS — F259 Schizoaffective disorder, unspecified: Secondary | ICD-10-CM | POA: Diagnosis present

## 2021-11-13 DIAGNOSIS — Z91010 Allergy to peanuts: Secondary | ICD-10-CM

## 2021-11-13 DIAGNOSIS — I5043 Acute on chronic combined systolic (congestive) and diastolic (congestive) heart failure: Secondary | ICD-10-CM | POA: Diagnosis not present

## 2021-11-13 DIAGNOSIS — I509 Heart failure, unspecified: Secondary | ICD-10-CM

## 2021-11-13 DIAGNOSIS — L0390 Cellulitis, unspecified: Secondary | ICD-10-CM

## 2021-11-13 DIAGNOSIS — I10 Essential (primary) hypertension: Secondary | ICD-10-CM | POA: Diagnosis present

## 2021-11-13 DIAGNOSIS — I110 Hypertensive heart disease with heart failure: Secondary | ICD-10-CM | POA: Diagnosis present

## 2021-11-13 DIAGNOSIS — I2510 Atherosclerotic heart disease of native coronary artery without angina pectoris: Secondary | ICD-10-CM | POA: Diagnosis present

## 2021-11-13 DIAGNOSIS — K219 Gastro-esophageal reflux disease without esophagitis: Secondary | ICD-10-CM | POA: Diagnosis present

## 2021-11-13 DIAGNOSIS — Z888 Allergy status to other drugs, medicaments and biological substances status: Secondary | ICD-10-CM

## 2021-11-13 DIAGNOSIS — J449 Chronic obstructive pulmonary disease, unspecified: Secondary | ICD-10-CM | POA: Diagnosis present

## 2021-11-13 LAB — COMPREHENSIVE METABOLIC PANEL
ALT: 17 U/L (ref 0–44)
AST: 22 U/L (ref 15–41)
Albumin: 3.8 g/dL (ref 3.5–5.0)
Alkaline Phosphatase: 78 U/L (ref 38–126)
Anion gap: 10 (ref 5–15)
BUN: 12 mg/dL (ref 6–20)
CO2: 28 mmol/L (ref 22–32)
Calcium: 9.1 mg/dL (ref 8.9–10.3)
Chloride: 96 mmol/L — ABNORMAL LOW (ref 98–111)
Creatinine, Ser: 1.01 mg/dL (ref 0.61–1.24)
GFR, Estimated: >60 mL/min (ref >60)
Glucose, Bld: 123 mg/dL — ABNORMAL HIGH (ref 70–99)
Potassium: 3.5 mmol/L (ref 3.5–5.1)
Sodium: 134 mmol/L — ABNORMAL LOW (ref 135–145)
Total Bilirubin: 1.1 mg/dL (ref 0.3–1.2)
Total Protein: 8.4 g/dL — ABNORMAL HIGH (ref 6.5–8.1)

## 2021-11-13 LAB — URINALYSIS, ROUTINE W REFLEX MICROSCOPIC
Glucose, UA: NEGATIVE mg/dL
Hgb urine dipstick: NEGATIVE
Ketones, ur: 15 mg/dL — AB
Leukocytes,Ua: NEGATIVE
Nitrite: NEGATIVE
Specific Gravity, Urine: 1.025 (ref 1.005–1.030)
pH: 6 (ref 5.0–8.0)

## 2021-11-13 LAB — LACTIC ACID, PLASMA
Lactic Acid, Venous: 1.5 mmol/L (ref 0.5–1.9)
Lactic Acid, Venous: 1.5 mmol/L (ref 0.5–1.9)

## 2021-11-13 LAB — CBC WITH DIFFERENTIAL/PLATELET
Abs Immature Granulocytes: 0.03 10*3/uL (ref 0.00–0.07)
Basophils Absolute: 0 10*3/uL (ref 0.0–0.1)
Basophils Relative: 1 %
Eosinophils Absolute: 0 10*3/uL (ref 0.0–0.5)
Eosinophils Relative: 0 %
HCT: 44.1 % (ref 39.0–52.0)
Hemoglobin: 14.9 g/dL (ref 13.0–17.0)
Immature Granulocytes: 0 %
Lymphocytes Relative: 26 %
Lymphs Abs: 2.2 10*3/uL (ref 0.7–4.0)
MCH: 32.4 pg (ref 26.0–34.0)
MCHC: 33.8 g/dL (ref 30.0–36.0)
MCV: 95.9 fL (ref 80.0–100.0)
Monocytes Absolute: 0.8 10*3/uL (ref 0.1–1.0)
Monocytes Relative: 9 %
Neutro Abs: 5.5 10*3/uL (ref 1.7–7.7)
Neutrophils Relative %: 64 %
Platelets: 283 10*3/uL (ref 150–400)
RBC: 4.6 MIL/uL (ref 4.22–5.81)
RDW: 13.5 % (ref 11.5–15.5)
WBC: 8.7 10*3/uL (ref 4.0–10.5)
nRBC: 0 % (ref 0.0–0.2)

## 2021-11-13 LAB — GLUCOSE, CAPILLARY: Glucose-Capillary: 109 mg/dL — ABNORMAL HIGH (ref 70–99)

## 2021-11-13 LAB — PROTIME-INR
INR: 1.1 (ref 0.8–1.2)
Prothrombin Time: 13.7 seconds (ref 11.4–15.2)

## 2021-11-13 IMAGING — CR DG CHEST 2V
2 series · 2 of 2 positions shown · non-contrast
Comparison: [DATE]

CLINICAL DATA: Suspected sepsis

EXAM:
CHEST - 2 VIEW

[x chest ap]
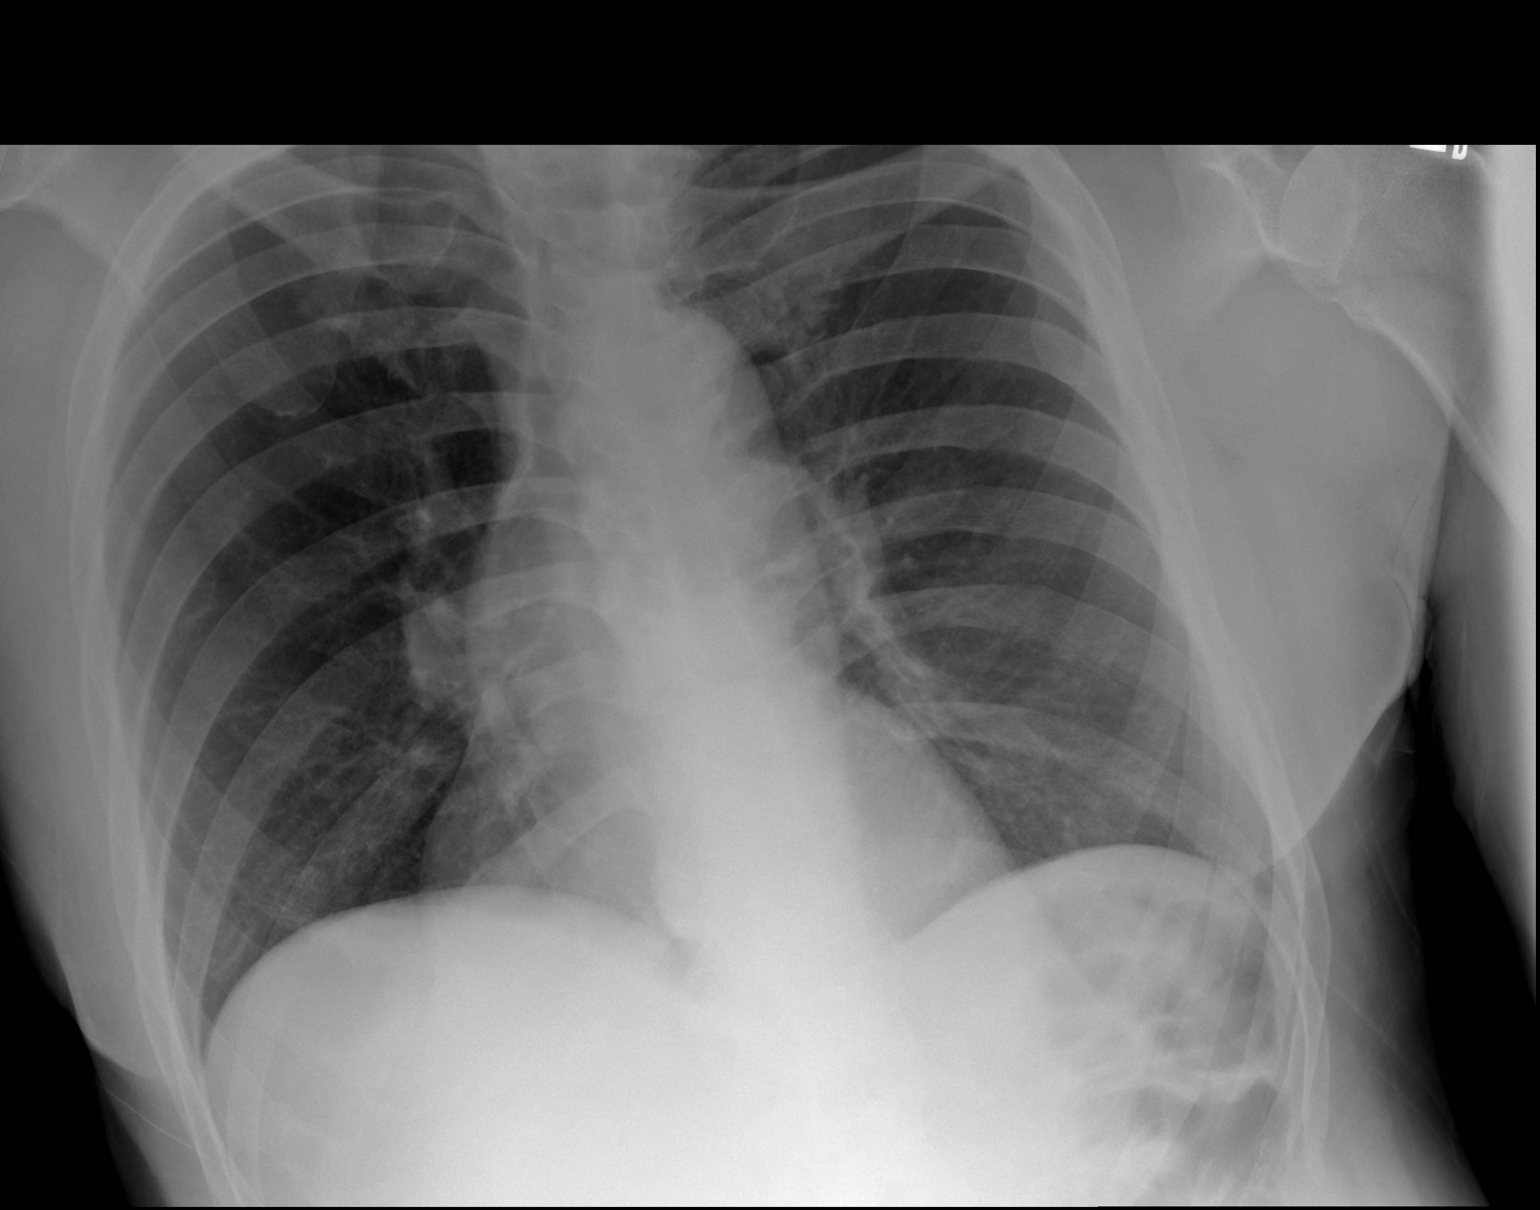

[w chest lat]
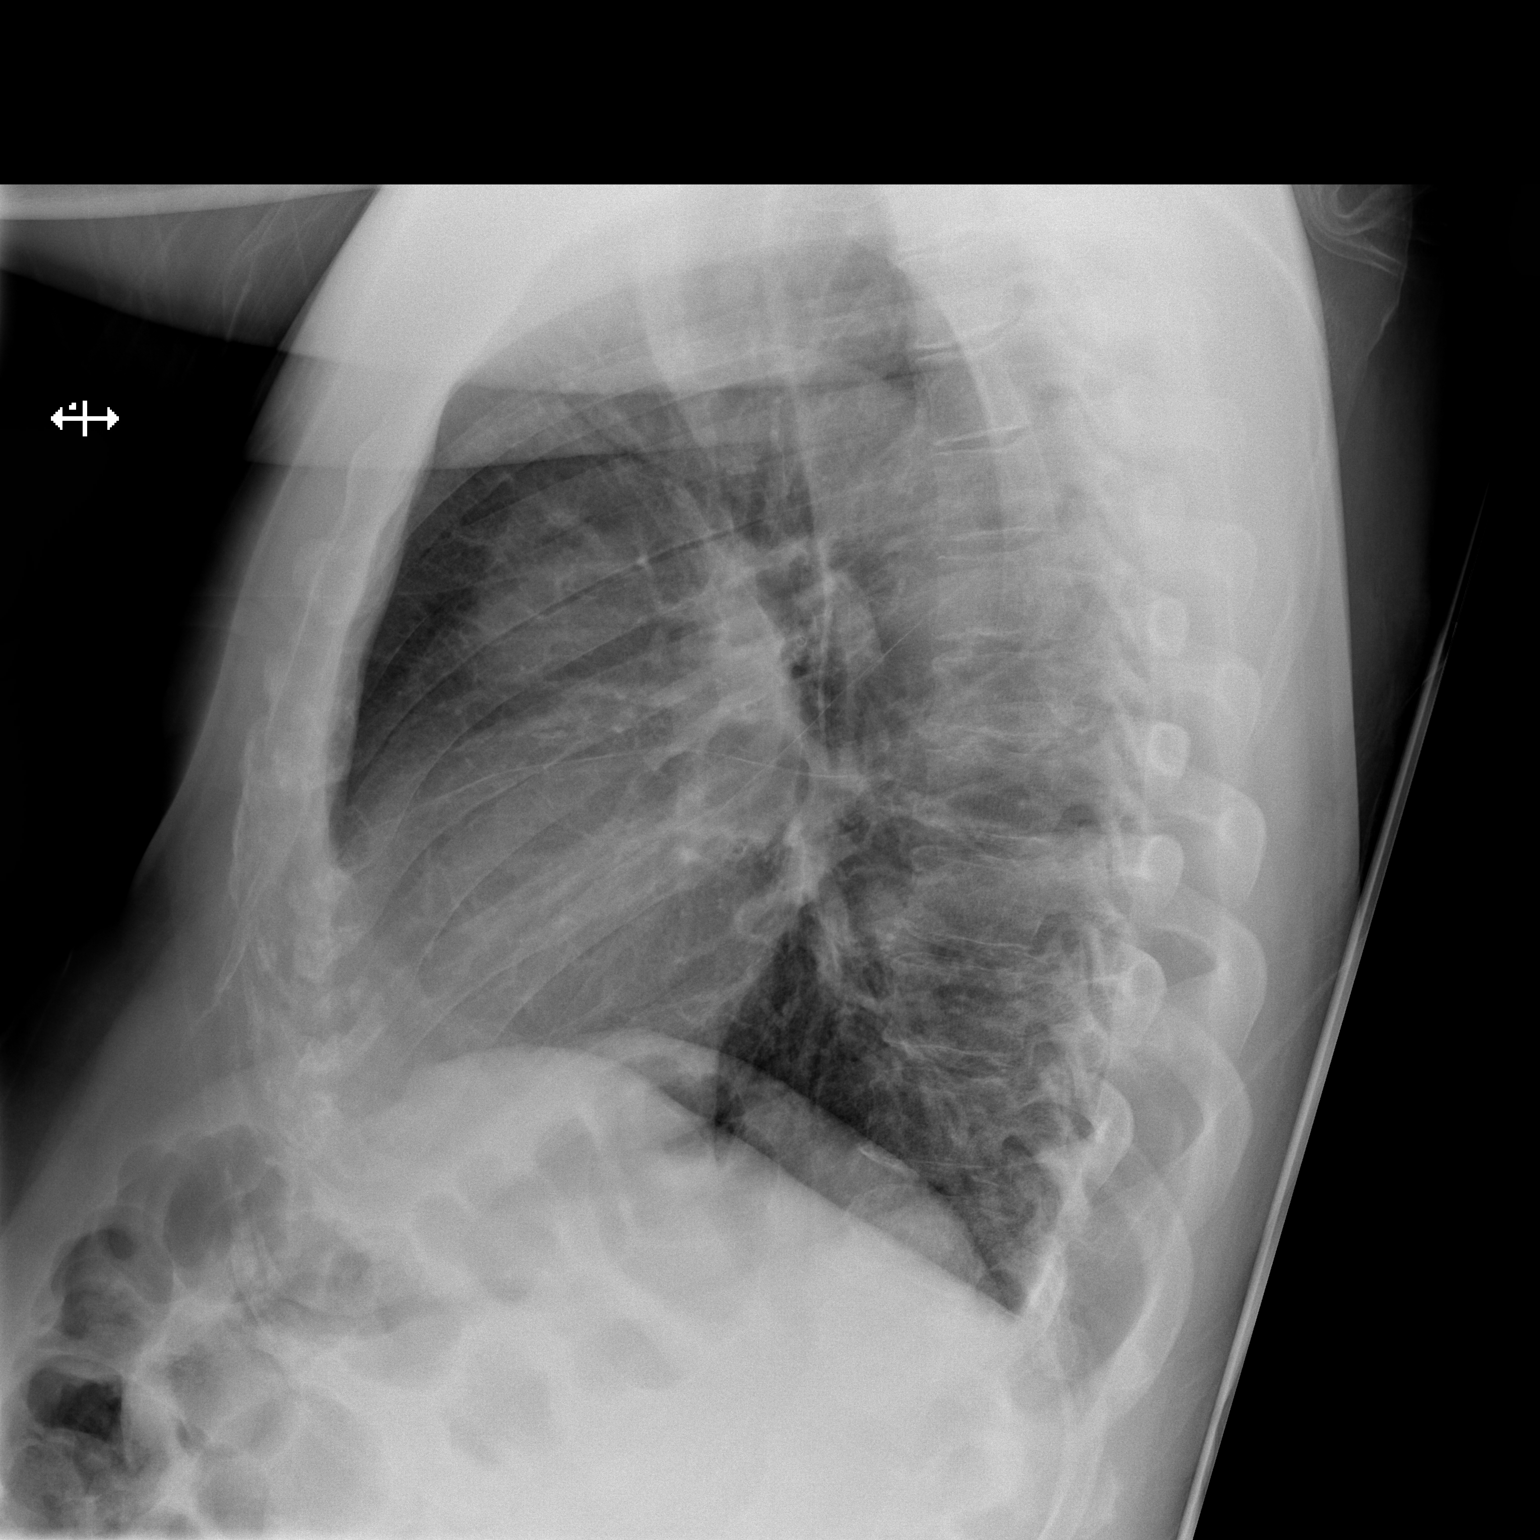

[2 of 2 positions shown; findings below may reference images not displayed]

FINDINGS: Apparent shift of mediastinum to the right may be due to rotation.
Cardiac size is within normal limits. There are no signs of
pulmonary edema or new focal infiltrates. There is no pleural
effusion or pneumothorax.
IMPRESSION: No active cardiopulmonary disease.

## 2021-11-13 MED ORDER — MORPHINE SULFATE (PF) 4 MG/ML IV SOLN
4.0000 mg | Freq: Once | INTRAVENOUS | Status: AC
  Administered 2021-11-13: 15:00:00 4 mg via INTRAVENOUS
  Filled 2021-11-13: qty 1

## 2021-11-13 MED ORDER — CARVEDILOL 3.125 MG PO TABS
3.1250 mg | ORAL_TABLET | Freq: Two times a day (BID) | ORAL | Status: DC
  Administered 2021-11-13 – 2021-11-21 (×16): 3.125 mg via ORAL
  Filled 2021-11-13 (×16): qty 1

## 2021-11-13 MED ORDER — VANCOMYCIN HCL 1500 MG/300ML IV SOLN
1500.0000 mg | Freq: Once | INTRAVENOUS | Status: DC
  Filled 2021-11-13: qty 300

## 2021-11-13 MED ORDER — INSULIN ASPART 100 UNIT/ML IJ SOLN
0.0000 [IU] | Freq: Three times a day (TID) | INTRAMUSCULAR | Status: DC
  Administered 2021-11-18: 17:00:00 2 [IU] via SUBCUTANEOUS

## 2021-11-13 MED ORDER — TRAZODONE HCL 50 MG PO TABS: 50.0000 mg | ORAL_TABLET | Freq: Every day | ORAL | Status: DC

## 2021-11-13 MED ORDER — SPIRONOLACTONE 12.5 MG HALF TABLET
12.5000 mg | ORAL_TABLET | Freq: Every day | ORAL | Status: DC
  Administered 2021-11-14 – 2021-11-21 (×8): 12.5 mg via ORAL
  Filled 2021-11-13 (×8): qty 1

## 2021-11-13 MED ORDER — LEVETIRACETAM 500 MG PO TABS: 500.0000 mg | ORAL_TABLET | Freq: Two times a day (BID) | ORAL | Status: DC

## 2021-11-13 MED ORDER — OXYCODONE HCL 5 MG PO TABS
5.0000 mg | ORAL_TABLET | ORAL | Status: DC | PRN
  Administered 2021-11-13 – 2021-11-14 (×4): 5 mg via ORAL
  Filled 2021-11-13 (×4): qty 1

## 2021-11-13 MED ORDER — OLANZAPINE 5 MG PO TABS
5.0000 mg | ORAL_TABLET | Freq: Every day | ORAL | Status: DC
  Administered 2021-11-13 – 2021-11-16 (×2): 5 mg via ORAL
  Filled 2021-11-13 (×8): qty 1

## 2021-11-13 MED ORDER — SACUBITRIL-VALSARTAN 24-26 MG PO TABS
1.0000 | ORAL_TABLET | Freq: Two times a day (BID) | ORAL | Status: DC
  Administered 2021-11-13 – 2021-11-21 (×16): 1 via ORAL
  Filled 2021-11-13 (×17): qty 1

## 2021-11-13 MED ORDER — CEFAZOLIN SODIUM-DEXTROSE 1-4 GM/50ML-% IV SOLN
1.0000 g | Freq: Three times a day (TID) | INTRAVENOUS | Status: DC
  Administered 2021-11-13 – 2021-11-14 (×4): 1 g via INTRAVENOUS
  Filled 2021-11-13 (×4): qty 50

## 2021-11-13 MED ORDER — FUROSEMIDE 40 MG PO TABS
40.0000 mg | ORAL_TABLET | Freq: Every day | ORAL | Status: DC
  Administered 2021-11-14: 40 mg via ORAL
  Filled 2021-11-13: qty 1

## 2021-11-13 MED ORDER — ENOXAPARIN SODIUM 40 MG/0.4ML IJ SOSY
40.0000 mg | PREFILLED_SYRINGE | INTRAMUSCULAR | Status: DC
  Administered 2021-11-15 – 2021-11-17 (×3): 40 mg via SUBCUTANEOUS
  Filled 2021-11-13 (×8): qty 0.4

## 2021-11-13 NOTE — H&P (Signed)
History and Physical    [name] XTG:[id number] DOB: [birth year] DOA: 11/13/2021  PCP: Pcp, No  Patient coming from: Homeless.  I have personally briefly reviewed patient's old medical records in [facility]  Chief Complaint: Right leg infection.  HPI: [name] is a 55 y.o. male with medical history significant of asthma, bipolar 1 disorder, depression, chronic combined systolic and diastolic CHF, COPD, GERD, homelessness, hypertension, QT prolongation, schizoaffective disorder who is coming to the emergency department due to worsening of right lower extremity cellulitis.  No new trauma to the area.  He denies fever, chills, night sweats feels malaised.  He does not know how his blood glucose have been doing.  He is not currently on diabetes medications.  No rhinorrhea, sore throat, dyspnea, productive cough, wheezing or hemoptysis.  Denied chest pain, palpitations, diaphoresis, PND, orthopnea or pitting edema of the lower extremities.  No dysuria, frequency or hematuria.  No polyuria, polydipsia, polyphagia or blurred vision.  ED Course: Initial vital signs were temperature 97.7 F, pulse 105, respiration 20, BP 159/120 mmHg O2 sat 98% on room air.  The patient was given morphine 4 mg IVP and started on cefazolin 1 g every 8 hours per pharmacy recommendations.  Lab work: His urinalysis shows small bilirubinuria, ketonuria 50 mg/dL and trace proteinuria.  There was rare bacteria on microscopic examination.  CBC was normal.  PT and INR unremarkable.  Lactic acid x2 normal.  CMP showed a sodium 134 and chloride 96 mmol/L.  Glucose 123 mg/dL and total protein 8.4 g/dL.  The rest of the CMP values were normal.  Imaging: 2 view chest radiograph did not show any active cardiopulmonary disease.  Please see images and full details report for further details.  Review of Systems: As per HPI otherwise all other systems reviewed and are negative.  Past Medical History:  Diagnosis Date    Asthma    Bipolar 1 disorder (HCC)    CHF (congestive heart failure) (HCC)    COPD (chronic obstructive pulmonary disease) (HCC)    Depression    Diabetes mellitus without complication (HCC)    GERD (gastroesophageal reflux disease)    Homelessness    Hypertension    QT prolongation    Schizoaffective disorder [facility])     Past Surgical History:  Procedure Laterality Date   AMPUTATION TOE Bilateral 01/02/2021   Procedure: AMPUTATION TOE;  Surgeon: Park Liter, DPM;  Location: [facility] OR;  Service: Podiatry;  Laterality: Bilateral;  Ok to leave patient on hospital bed   IR ANGIO INTRA EXTRACRAN SEL COM CAROTID INNOMINATE BILAT MOD SED  12/17/2020   IR ANGIO VERTEBRAL SEL VERTEBRAL BILAT MOD SED  12/17/2020   RADIOLOGY WITH ANESTHESIA N/A 12/17/2020   Procedure: IR WITH ANESTHESIA;  Surgeon: Radiologist, Medication, MD;  Location: [facility] OR;  Service: Radiology;  Laterality: N/A;   Social History  reports that he has been smoking cigarettes. He has never used smokeless tobacco. He reports current alcohol use. He reports current drug use. Drugs: Cocaine, Marijuana, and "Crack" cocaine.  Allergies  Allergen Reactions   Ibuprofen Anaphylaxis   Acetaminophen Hives, Nausea And Vomiting and Rash    Pt overdosed on bottle of APAP 05/06/16    Lyrica Cr [Pregabalin Er] Other (See Comments)    Dystonia and dysarthria   Peanut-Containing Drug Products Other (See Comments)    Pt denies allergy to peanut butter 03/02/2021   Family History  Problem Relation Age of Onset   Drug abuse Mother  Suicidality Mother    Prior to Admission medications   Medication Sig Start Date End Date Taking? Authorizing Provider  albuterol (VENTOLIN HFA) 108 (90 Base) MCG/ACT inhaler Inhale 2 puffs into the lungs every 6 (six) hours as needed for wheezing or shortness of breath. 11/09/21  Yes Wyvonnia Dusky, MD  carvedilol (COREG) 3.125 MG tablet Take 1 tablet (3.125 mg total) by mouth 2 (two) times daily with a meal.  11/09/21  Yes Trifan, Carola Rhine, MD  furosemide (LASIX) 40 MG tablet Take 1 tablet (40 mg total) by mouth daily for 30 doses. 11/09/21 12/09/21 Yes Trifan, Carola Rhine, MD  OLANZapine (ZYPREXA) 5 MG tablet Take 1 tablet (5 mg total) by mouth at bedtime. 11/09/21  Yes Trifan, Carola Rhine, MD  sacubitril-valsartan (ENTRESTO) 24-26 MG Take 1 tablet by mouth 2 (two) times daily. 11/09/21 12/09/21 Yes Trifan, Carola Rhine, MD  spironolactone (ALDACTONE) 25 MG tablet Take 1/2 tablets (12.5 mg total) by mouth daily. 11/09/21  Yes Wyvonnia Dusky, MD  traMADol (ULTRAM) 50 MG tablet Take 1 tablet (50 mg total) by mouth every 12 (twelve) hours as needed for up to 10 doses. 11/09/21  Yes Trifan, Carola Rhine, MD  umeclidinium bromide (INCRUSE ELLIPTA) 62.5 MCG/INH AEPB Inhale 1 puff into the lungs daily. 11/16/20  Yes Pokhrel, Laxman, MD  atorvastatin (LIPITOR) 10 MG tablet Take 4 tablets (40 mg total) by mouth daily. Patient not taking: Reported on 11/13/2021 11/09/21   Wyvonnia Dusky, MD  carbamide peroxide (DEBROX) 6.5 % OTIC solution Place 5 drops into both ears twice daily. Patient not taking: Reported on 11/09/2021 11/04/21   Suella Broad, FNP  levETIRAcetam (KEPPRA) 500 MG tablet Take 1 tablet (500 mg total) by mouth 2 (two) times daily. Patient not taking: Reported on 11/13/2021 11/09/21 12/09/21  Wyvonnia Dusky, MD  pregabalin (LYRICA) 100 MG capsule Take 100 mg by mouth 3 (three) times daily. Patient not taking: Reported on 11/02/2021    [provider]  traZODone (DESYREL) 50 MG tablet Take 1 tablet (50 mg total) by mouth at bedtime. Patient not taking: Reported on 11/13/2021 11/09/21   Wyvonnia Dusky, MD   Physical Exam: Vitals:   11/13/21 0813 11/13/21 1350 11/13/21 1656  BP: (!) 159/120 (!) 178/112 (!) 177/99  Pulse: (!) 105 76 90  Resp: 20 16 16   Temp: 97.7 F (36.5 C)  (!) 97.5 F (36.4 C)  TempSrc: Oral  Oral  SpO2: 98% 94% 97%   Constitutional: NAD, calm,  comfortable Eyes: PERRL, lids and conjunctivae normal ENMT: Mucous membranes are moist. Posterior pharynx clear of any exudate or lesions. Neck: normal, supple, no masses, no thyromegaly Respiratory: Clear to auscultation bilaterally, no wheezing, no crackles. Normal respiratory effort. No accessory muscle use.  Cardiovascular: Regular rate and rhythm, no murmurs / rubs / gallops. No extremity edema. 2+ pedal pulses. No carotid bruits.  Abdomen: Obese, no distention.  Bowel sounds positive.  Soft, no tenderness, no masses palpated. No hepatosplenomegaly.  Musculoskeletal: no clubbing / cyanosis.  Amputated first and second toes. Good ROM, no contractures. Normal muscle tone.  Skin: RLE cellulitis with edema, erythema, calor: TTP and some bulla development on right foot dorsal aspect.  Please see pictures below. Neurologic: CN 2-12 grossly intact. Sensation intact, DTR normal. Strength 5/5 in all 4.  Psychiatric: Normal judgment and insight. Alert and oriented x 3. Normal mood.        Labs on Admission: I have personally reviewed following labs and imaging  studies  CBC: Recent Labs  Lab 11/07/21 1645 11/13/21 1301  WBC 10.4 8.7  NEUTROABS 4.7 5.5  HGB 15.3 14.9  HCT 44.8 44.1  MCV 95.7 95.9  PLT 250 [id number]    Basic Metabolic Panel: Recent Labs  Lab 11/07/21 1540 11/13/21 1301  NA 140 134*  K 4.5 3.5  CL 103 96*  CO2 22 28  GLUCOSE 95 123*  BUN 18 12  CREATININE 1.16 1.01  CALCIUM 9.5 9.1    GFR: Estimated Creatinine Clearance: 92 mL/min (by C-G formula based on SCr of 1.01 mg/dL).  Liver Function Tests: Recent Labs  Lab 11/13/21 1301  AST 22  ALT 17  ALKPHOS 78  BILITOT 1.1  PROT 8.4*  ALBUMIN 3.8    Urine analysis:    Component Value Date/Time   COLORURINE YELLOW 11/13/2021 1505   APPEARANCEUR HAZY (A) 11/13/2021 1505   LABSPEC 1.025 11/13/2021 1505   PHURINE 6.0 11/13/2021 1505   GLUCOSEU NEGATIVE 11/13/2021 1505   HGBUR NEGATIVE 11/13/2021 1505    BILIRUBINUR SMALL (A) 11/13/2021 1505   KETONESUR 15 (A) 11/13/2021 1505   PROTEINUR TRACE (A) 11/13/2021 1505   NITRITE NEGATIVE 11/13/2021 1505   LEUKOCYTESUR NEGATIVE 11/13/2021 1505   Radiological Exams on Admission: DG Chest 2 View  Result Date: 11/13/2021 CLINICAL DATA:  Suspected sepsis EXAM: CHEST - 2 VIEW COMPARISON:  11/02/2021 FINDINGS: Apparent shift of mediastinum to the right may be due to rotation. Cardiac size is within normal limits. There are no signs of pulmonary edema or new focal infiltrates. There is no pleural effusion or pneumothorax. IMPRESSION: No active cardiopulmonary disease. Electronically Signed   By: Elmer Picker M.D.   On: 11/13/2021 13:22    12/18/2020 echocardiogram IMPRESSIONS:  1. Left ventricular ejection fraction, by estimation, is <20%. The left  ventricle has severely decreased function. The left ventricle demonstrates  global hypokinesis. The left ventricular internal cavity size was  moderately dilated. There is moderate  left ventricular hypertrophy. Left ventricular diastolic parameters are  consistent with Grade II diastolic dysfunction (pseudonormalization).  Elevated left atrial pressure.   2. Right ventricular systolic function is moderately reduced. The right  ventricular size is mildly enlarged.   3. Left atrial size was moderately dilated.   4. Right atrial size was moderately dilated.   5. The mitral valve is normal in structure. Trivial mitral valve  regurgitation. No evidence of mitral stenosis.   6. The aortic valve is tricuspid. Aortic valve regurgitation is not  visualized. Mild aortic valve sclerosis is present, with no evidence of  aortic valve stenosis.   7. The inferior vena cava is normal in size with greater than 50%  respiratory variability, suggesting right atrial pressure of 3 mmHg.   EKG: Independently reviewed  Assessment/Plan Principal Problem:   Cellulitis of right lower  extremity Observation/MedSurg. Continue cefazolin 1 g every 8 hours. Analgesics as needed. Antiemetics as needed. Local care as needed.  Active Problems:   Type 2 diabetes Mellitus Carbohydrate modified diet. Check hemoglobin A1c. CBG monitoring with RI SS. Follow-up with PCP as an outpatient.    Hypertension On carvedilol 3.125 mg p.o. twice daily. All also on Entresto twice daily. Monitor BP, HR, renal function electrolytes.    Chronic combined systolic and diastolic HF Continue carvedilol as above. Continue Entresto twice daily.    CAD (coronary artery disease) Allergic to aspirin. Cannot for clopidogrel. Still smoking and not taking statin. Continue carvedilol 3.125 mg p.o. twice daily.    Bipolar I  disorder (HCC) Continue Zyprexa 5 mg p.o. bedtime.    Homelessness Consult TOC.    HLD (hyperlipidemia) No longer on statin. Follow-up with primary care provider.    DVT prophylaxis: Lovenox SQ. Code Status:   Full code. Family Communication:   Disposition Plan:   Patient is from:  Homeless.  Anticipated DC to:  TBD.  Anticipated DC date:  11/15/2021.  Anticipated DC barriers: Clinical status.  Consults called:  TOC team. Admission status:  Observation/MedSurg.   Severity of Illness: High severity after presenting with progressively worse RLE tenderness, erythema and edema in the setting of cellulitis.  The patient will remain in the hospital for IV antibiotics for 24 to 48 hours.   Reubin Milan MD [facility]  How to contact the [facility] Attending or Consulting provider [place] or covering provider during after hours [place], for this patient?   Check the care team in [facility] and look for a) attending/consulting [facility] provider listed and b) the [facility] team listed Log into [email or web address] and use [place]'s universal password to access. If you do not have the password, please contact the hospital operator. Locate the [facility] provider you are looking for under  [facility] and page to a number that you can be directly reached. If you still have difficulty reaching the provider, please page the [facility] (Director on Call) for the Hospitalists listed on amion for assistance.  11/13/2021, 4:56 PM   This document was prepared using Paramedic and may contain some unintended transcription errors.

## 2021-11-13 NOTE — ED Triage Notes (Signed)
[place] [facility] [place].   Called out for bilateral feet pain and swelling.   A/Ox4  Hx: amputation of both big toes.  Right foot bandage since he last left this ED  per ems.   Ambulation at baseline per ems

## 2021-11-13 NOTE — Progress Notes (Signed)
Patient refuses to answer admission questions

## 2021-11-13 NOTE — ED Provider Notes (Signed)
[facility]EMERGENCY DEPT Provider Note   CSN: [id number] Arrival date & time: 11/13/21  0807     History Chief Complaint  Patient presents with   Foot Swelling    Bilateral     [name] is a 55 y.o. male.  The history is provided by the patient and medical records. No language interpreter was used.    55 year old with hx of bipolar, HTN, HLD, CHF, DM, peripheral neuropathy, previous osteomyelitis, homelessness who presents complaining of worsening right leg pain.  Patient admits that he has chronic issues of his leg swelling but for the past week he has noticed increasing pain and swelling to his right leg.  He has been seen evaluated for his leg pain on December 8 December 10 and was diagnosed with cellulitis and was given 1 dose of Dalvance.  Initially he was prescribed Keflex but was unable to fill his medication.  He also had a negative DVT study on the eighth.  Today he is back with more pain and swelling involving his right leg.  Pain is 10 out of 10, sharp, throbbing without any numbness or tingling sensation.  He denies having fever chills dizziness chest pain shortness of breath nausea or vomiting.  He has been taking his Lasix without any significant relief.  He endorsed some intermittent cough for the past week which is nonproductive.  Has been using his inhaler for his asthma.    Past Medical History:  Diagnosis Date   Asthma    Bipolar 1 disorder (HCC)    CHF (congestive heart failure) (HCC)    COPD (chronic obstructive pulmonary disease) (HCC)    Depression    Diabetes mellitus without complication (HCC)    GERD (gastroesophageal reflux disease)    Homelessness    Hypertension    QT prolongation    Schizoaffective disorder (HCC)     Patient Active Problem List   Diagnosis Date Noted   Antisocial personality disorder (HCC) 11/04/2021   Malingering 11/04/2021   Foot pain 04/23/2021   Acute on chronic combined systolic (congestive)  and diastolic (congestive) heart failure (HCC) 03/02/2021   History of stroke 03/02/2021   Substance use disorder 03/02/2021   Skin ulcer of right great toe with necrosis of bone (HCC)    Encounter for assessment of healthcare decision-making capacity    Pain, dental    Dystonia    Dysarthria    Shock liver 01/04/2021   Acute metabolic encephalopathy 01/04/2021   CAD (coronary artery disease) 01/04/2021   HTN (hypertension) 01/04/2021   Bipolar I disorder (HCC) 01/04/2021   Cocaine abuse (HCC) 01/04/2021   Homelessness 01/04/2021   HLD (hyperlipidemia) 01/04/2021   Osteomyelitis of great toe (HCC)    Ulcer of great toe, left, with necrosis of bone (HCC)    Ulcer of great toe, right, with necrosis of muscle (HCC)    Acute osteomyelitis of toe, left (HCC)    Acute on chronic combined systolic and diastolic CHF (congestive heart failure) (HCC) 12/20/2020   Respiratory failure (HCC)    Ischemic stroke (HCC) 12/17/2020   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   Diabetic foot ulcer (HCC) 12/08/2020   CHF (congestive heart failure) (HCC) 11/14/2020   CHF exacerbation (HCC) 11/13/2020   Hypokalemia    Hyperlipidemia    Acute respiratory failure with hypoxia (HCC) 10/26/2020   COPD with acute exacerbation (HCC) 10/14/2020   Acute on chronic combined systolic and diastolic CHF (congestive heart failure) (HCC) 10/02/2020   Near  syncope 07/21/2020   Schizoaffective disorder (HCC) 06/24/2019   SOB (shortness of breath)    QT prolongation 05/25/2019   Cocaine abuse (HCC)    Elevated troponin    Homeless    Suicidal ideation    Bilateral foot pain 04/05/2019   Housing situation unstable 03/28/2019   Bilateral lower extremity edema    Asthma 12/26/2016   Tobacco use disorder 12/26/2016   Schizophrenia (HCC) 12/26/2016   Cocaine abuse with cocaine-induced mood disorder (HCC) 09/06/2016   Alcohol use disorder, mild, abuse 07/31/2016   Substance induced mood disorder (HCC) 07/30/2016    Schizoaffective disorder, bipolar type (HCC) 05/31/2016   Cocaine use disorder, severe, dependence (HCC) 05/31/2016   Hypertension 05/31/2016    Past Surgical History:  Procedure Laterality Date   AMPUTATION TOE Bilateral 01/02/2021   Procedure: AMPUTATION TOE;  Surgeon: Park Liter, DPM;  Location: [facility] OR;  Service: Podiatry;  Laterality: Bilateral;  Ok to leave patient on hospital bed   IR ANGIO INTRA EXTRACRAN SEL COM CAROTID INNOMINATE BILAT MOD SED  12/17/2020   IR ANGIO VERTEBRAL SEL VERTEBRAL BILAT MOD SED  12/17/2020   RADIOLOGY WITH ANESTHESIA N/A 12/17/2020   Procedure: IR WITH ANESTHESIA;  Surgeon: Radiologist, Medication, MD;  Location: [facility] OR;  Service: Radiology;  Laterality: N/A;       Family History  Problem Relation Age of Onset   Drug abuse Mother    Suicidality Mother     Social History   Tobacco Use   Smoking status: Every Day    Years: 30.00    Types: Cigarettes   Smokeless tobacco: Never  Vaping Use   Vaping Use: Never used  Substance Use Topics   Alcohol use: Yes    Comment: infrequently   Drug use: Yes    Types: Cocaine, Marijuana, "Crack" cocaine    Home Medications Prior to Admission medications   Medication Sig Start Date End Date Taking? Authorizing Provider  albuterol (VENTOLIN HFA) 108 (90 Base) MCG/ACT inhaler Inhale 2 puffs into the lungs every 6 (six) hours as needed for wheezing or shortness of breath. 11/09/21   Terald Sleeper, MD  atorvastatin (LIPITOR) 10 MG tablet Take 4 tablets (40 mg total) by mouth daily. 11/09/21   Terald Sleeper, MD  carbamide peroxide (DEBROX) 6.5 % OTIC solution Place 5 drops into both ears twice daily. Patient not taking: Reported on 11/09/2021 11/04/21   Maryagnes Amos, FNP  carvedilol (COREG) 3.125 MG tablet Take 1 tablet (3.125 mg total) by mouth 2 (two) times daily with a meal. 11/09/21   Trifan, Kermit Balo, MD  furosemide (LASIX) 40 MG tablet Take 1 tablet (40 mg total) by mouth daily for 30  doses. 11/09/21 12/09/21  Terald Sleeper, MD  levETIRAcetam (KEPPRA) 500 MG tablet Take 1 tablet (500 mg total) by mouth 2 (two) times daily. 11/09/21 12/09/21  Terald Sleeper, MD  OLANZapine (ZYPREXA) 5 MG tablet Take 1 tablet (5 mg total) by mouth at bedtime. 11/09/21   Terald Sleeper, MD  pregabalin (LYRICA) 100 MG capsule Take 100 mg by mouth 3 (three) times daily. Patient not taking: Reported on 11/02/2021    [provider]  sacubitril-valsartan (ENTRESTO) 24-26 MG Take 1 tablet by mouth 2 (two) times daily. 11/09/21 12/09/21  Terald Sleeper, MD  spironolactone (ALDACTONE) 25 MG tablet Take 1/2 tablets (12.5 mg total) by mouth daily. 11/09/21   Terald Sleeper, MD  traMADol (ULTRAM) 50 MG tablet Take 1 tablet (50  mg total) by mouth every 12 (twelve) hours as needed for up to 10 doses. 11/09/21   Terald Sleeper, MD  traZODone (DESYREL) 50 MG tablet Take 1 tablet (50 mg total) by mouth at bedtime. 11/09/21   Terald Sleeper, MD  umeclidinium bromide (INCRUSE ELLIPTA) 62.5 MCG/INH AEPB Inhale 1 puff into the lungs daily. Patient not taking: Reported on 11/02/2021 11/16/20   Joycelyn Das, MD    Allergies    Ibuprofen, Acetaminophen, Lyrica cr [pregabalin er], and Peanut-containing drug products  Review of Systems   Review of Systems  All other systems reviewed and are negative.  Physical Exam Updated Vital Signs BP (!) 159/120 (BP Location: Left Arm)    Pulse (!) 105    Temp 97.7 F (36.5 C) (Oral)    Resp 20    SpO2 98%   Physical Exam Vitals and nursing note reviewed.  Constitutional:      General: He is not in acute distress.    Appearance: He is well-developed.  HENT:     Head: Atraumatic.  Eyes:     Conjunctiva/sclera: Conjunctivae normal.  Cardiovascular:     Rate and Rhythm: Tachycardia present.  Pulmonary:     Effort: Pulmonary effort is normal.     Breath sounds: Normal breath sounds.  Abdominal:     Palpations: Abdomen is soft.      Tenderness: There is no abdominal tenderness.  Musculoskeletal:        General: Tenderness (Right lower extremity: The extremity is edematous, and diffusely tender to the touch with warmth appreciated throughout the lateral aspect of his leg.  Shallow ulceration noted to lateral mid tib-fib.  Difficult to palpate pedal pulse.) present.     Cervical back: Neck supple.     Comments: Edema appreciated to bilateral legs extending towards the knees right greater than left.  Patient has several missing toes from previous amputation involving both feet.  Several bullae can be appreciated to right lower extremity.  Skin:    Findings: No rash.  Neurological:     Mental Status: He is alert.    ED Results / Procedures / Treatments   Labs (all labs ordered are listed, but only abnormal results are displayed) Labs Reviewed  COMPREHENSIVE METABOLIC PANEL - Abnormal; Notable for the following components:      Result Value   Sodium 134 (*)    Chloride 96 (*)    Glucose, Bld 123 (*)    Total Protein 8.4 (*)    All other components within normal limits  CULTURE, BLOOD (ROUTINE X 2)  CULTURE, BLOOD (ROUTINE X 2)  LACTIC ACID, PLASMA  CBC WITH DIFFERENTIAL/PLATELET  PROTIME-INR  LACTIC ACID, PLASMA  URINALYSIS, ROUTINE W REFLEX MICROSCOPIC    EKG None  Radiology DG Chest 2 View  Result Date: 11/13/2021 CLINICAL DATA:  Suspected sepsis EXAM: CHEST - 2 VIEW COMPARISON:  11/02/2021 FINDINGS: Apparent shift of mediastinum to the right may be due to rotation. Cardiac size is within normal limits. There are no signs of pulmonary edema or new focal infiltrates. There is no pleural effusion or pneumothorax. IMPRESSION: No active cardiopulmonary disease. Electronically Signed   By: Ernie Avena M.D.   On: 11/13/2021 13:22    Procedures Procedures   Medications Ordered in ED Medications  ceFAZolin (ANCEF) IVPB 1 g/50 mL premix (has no administration in time range)  morphine 4 MG/ML injection 4  mg (4 mg Intravenous Given 11/13/21 1459)    ED Course  I have  reviewed the triage vital signs and the nursing notes.  Pertinent labs & imaging results that were available during my care of the patient were reviewed by me and considered in my medical decision making (see chart for details).    MDM Rules/Calculators/A&P                           BP (!) 178/112    Pulse 76    Temp 97.7 F (36.5 C) (Oral)    Resp 16    SpO2 94%   Final Clinical Impression(s) / ED Diagnoses Final diagnoses:  Cellulitis of right lower leg    Rx / DC Orders ED Discharge Orders     None      Patient here with progressive worsening right lower extremity in which she has been seen evaluate several times within the past week for his complaint.  He was diagnosed with cellulitis of his right lower extremity, initially was given Keflex but have not had a chance to obtain the medication.  He was also given Dalvance during his last visit.  He is here with worsening pain.  He has had multiple toes amputation due to osteomyelitis.  He has had negative DVT study as well as negative x-ray recently.  At this time, I felt the patient may benefit from hospital admission for IV antibiotics for his infection.  Patient is high risk for complication from his infection.  2:59 PM I have reached out to our pharmacist who recommend cefazolin since Dalvance should provide coverage for 7 days.  I also appreciate consultation from [facility], Dr. Robb Matar who agrees to see and will admit patient for further managements of his right lower extremity cellulitis.   Fayrene Helper, PA-C 11/13/21 1502    Margarita Grizzle, MD 11/15/21 [phone]

## 2021-11-14 DIAGNOSIS — I5043 Acute on chronic combined systolic (congestive) and diastolic (congestive) heart failure: Secondary | ICD-10-CM | POA: Diagnosis not present

## 2021-11-14 DIAGNOSIS — F17210 Nicotine dependence, cigarettes, uncomplicated: Secondary | ICD-10-CM | POA: Diagnosis present

## 2021-11-14 DIAGNOSIS — J449 Chronic obstructive pulmonary disease, unspecified: Secondary | ICD-10-CM | POA: Diagnosis present

## 2021-11-14 DIAGNOSIS — Z888 Allergy status to other drugs, medicaments and biological substances status: Secondary | ICD-10-CM | POA: Diagnosis not present

## 2021-11-14 DIAGNOSIS — Z886 Allergy status to analgesic agent status: Secondary | ICD-10-CM | POA: Diagnosis not present

## 2021-11-14 DIAGNOSIS — F1410 Cocaine abuse, uncomplicated: Secondary | ICD-10-CM | POA: Diagnosis present

## 2021-11-14 DIAGNOSIS — Z91010 Allergy to peanuts: Secondary | ICD-10-CM | POA: Diagnosis not present

## 2021-11-14 DIAGNOSIS — Z813 Family history of other psychoactive substance abuse and dependence: Secondary | ICD-10-CM | POA: Diagnosis not present

## 2021-11-14 DIAGNOSIS — F319 Bipolar disorder, unspecified: Secondary | ICD-10-CM

## 2021-11-14 DIAGNOSIS — E1142 Type 2 diabetes mellitus with diabetic polyneuropathy: Secondary | ICD-10-CM | POA: Diagnosis present

## 2021-11-14 DIAGNOSIS — I2510 Atherosclerotic heart disease of native coronary artery without angina pectoris: Secondary | ICD-10-CM | POA: Diagnosis present

## 2021-11-14 DIAGNOSIS — I110 Hypertensive heart disease with heart failure: Secondary | ICD-10-CM | POA: Diagnosis present

## 2021-11-14 DIAGNOSIS — Z79899 Other long term (current) drug therapy: Secondary | ICD-10-CM | POA: Diagnosis not present

## 2021-11-14 DIAGNOSIS — K219 Gastro-esophageal reflux disease without esophagitis: Secondary | ICD-10-CM | POA: Diagnosis present

## 2021-11-14 DIAGNOSIS — F259 Schizoaffective disorder, unspecified: Secondary | ICD-10-CM | POA: Diagnosis present

## 2021-11-14 DIAGNOSIS — E785 Hyperlipidemia, unspecified: Secondary | ICD-10-CM | POA: Diagnosis present

## 2021-11-14 DIAGNOSIS — Z5900 Homelessness unspecified: Secondary | ICD-10-CM | POA: Diagnosis not present

## 2021-11-14 DIAGNOSIS — L03115 Cellulitis of right lower limb: Secondary | ICD-10-CM | POA: Diagnosis present

## 2021-11-14 LAB — GLUCOSE, CAPILLARY
Glucose-Capillary: 102 mg/dL — ABNORMAL HIGH (ref 70–99)
Glucose-Capillary: 94 mg/dL (ref 70–99)
Glucose-Capillary: 187 mg/dL — ABNORMAL HIGH (ref 70–99)

## 2021-11-14 LAB — HEMOGLOBIN A1C
Hgb A1c MFr Bld: 5.8 % — ABNORMAL HIGH (ref 4.8–5.6)
Mean Plasma Glucose: 119.76 mg/dL

## 2021-11-14 MED ORDER — OXYCODONE HCL 5 MG PO TABS
10.0000 mg | ORAL_TABLET | ORAL | Status: DC | PRN
  Administered 2021-11-14 – 2021-11-20 (×24): 10 mg via ORAL
  Filled 2021-11-14 (×25): qty 2

## 2021-11-14 MED ORDER — HYDROCERIN EX CREA
TOPICAL_CREAM | Freq: Every day | CUTANEOUS | Status: DC
  Filled 2021-11-14: qty 113

## 2021-11-14 MED ORDER — FUROSEMIDE 10 MG/ML IJ SOLN
40.0000 mg | Freq: Two times a day (BID) | INTRAMUSCULAR | Status: DC
  Administered 2021-11-14 – 2021-11-18 (×6): 40 mg via INTRAVENOUS
  Filled 2021-11-14 (×8): qty 4

## 2021-11-14 NOTE — Consult Note (Signed)
WOC Nurse Consult Note: Reason for Consult: Deflating serum-filled blisters to bilateral LEs with edema. See photos provided on admission and uploaded to EMR. Consult performed remotely. Wound type: venous insufficiency vs infection Pressure Injury POA: N/A Wound bed: Deflating blisters, no open areas Drainage (amount, consistency, odor) None Periwound:dry, intact Dressing procedure/placement/frequency: Topical care guidance is provided for these areas using once daily cleanse and moisturizing with Eucerin Cream. This is to be followed by placement of xeroform gauze over the affected areas and securing with a few turns of Kerlix roll gauze/paper tape.  Heels are to be floated using Prevalon boots.  WOC nursing team will not follow, but will remain available to this patient, the nursing and medical teams.  Please re-consult if needed. Thanks, Ladona Mow, MSN, RN, GNP, [place]  Pager# [phone]

## 2021-11-14 NOTE — Progress Notes (Signed)
PROGRESS NOTE    [name]  [id number] DOB: 08/18/1966 DOA: 11/13/2021 PCP: Pcp, No    Brief Narrative:   55 y.o. male with medical history significant of asthma, bipolar 1 disorder, depression, chronic combined systolic and diastolic CHF, COPD, GERD, homelessness, hypertension, QT prolongation, schizoaffective disorder who is coming to the emergency department due to worsening of right lower extremity cellulitis.  No new trauma to the area.  He denies fever, chills, night sweats feels malaised.  He does not know how his blood glucose have been doing.  He is not currently on diabetes medications.  No rhinorrhea, sore throat, dyspnea, productive cough, wheezing or hemoptysis.  Denied chest pain, palpitations, diaphoresis, PND, orthopnea or pitting edema of the lower extremities.  No dysuria, frequency or hematuria.  No polyuria, polydipsia, polyphagia or blurred vision.  Assessment & Plan:   Principal Problem:   Cellulitis of right lower extremity Active Problems:   Hypertension   CAD (coronary artery disease)   Bipolar I disorder (HCC)   Homelessness   HLD (hyperlipidemia)   Type 2 diabetes mellitus (HCC)  Principal Problem:   Cellulitis of right lower extremity ruled out No significant erythema appreciated. Afebrile. No leukocytosis Marked LE edema bilaterally with blisters, some open Given hx of chf not currently treated, suspect acute CHF exacerbation with pain secondary to LE edema Was continued on cefazolin 1 g every 8 hours, will hold for now Analgesics as needed. Antiemetics as needed. Local care as needed.   Active Problems:   Type 2 diabetes Mellitus Carbohydrate modified diet. Check hemoglobin A1c. CBG monitoring with RI SS. Recommend follow-up with PCP as an outpatient.     Hypertension On carvedilol 3.125 mg p.o. twice daily. All also on Entresto twice daily. BP stable at this time    Acute on Chronic combined systolic and diastolic HF Continue  carvedilol as above. Continue Entresto twice daily. Most recent EF of <20% with grade 2 diastolic dysfunction Pt reports increased B LE edema with increased orthopnea  Will change lasix to 40mg  IV bid Follow serial renal panel     CAD (coronary artery disease) Allergic to aspirin. Cannot for clopidogrel. Still smoking and not taking statin. Continue carvedilol 3.125 mg p.o. twice daily. Recent tox screen pos for cocaine. Will need cessation     Bipolar I disorder (HCC) Continue Zyprexa 5 mg p.o. bedtime.     Homelessness Consult TOC.     HLD (hyperlipidemia) No longer on statin. Follow-up with primary care provider when discharged.   DVT prophylaxis: Lovenox subq Code Status: Full Family Communication: Pt in room, family not at bedside  Status is: Observation  The patient will require care spanning > 2 midnights and should be moved to inpatient because: Severity of illness    Consultants:    Procedures:    Antimicrobials: Anti-infectives (From admission, onward)    Start     Dose/Rate Route Frequency Ordered Stop   11/13/21 1500  ceFAZolin (ANCEF) IVPB 1 g/50 mL premix        1 g 100 mL/hr over 30 Minutes Intravenous Every 8 hours 11/13/21 1458     11/13/21 1415  vancomycin (VANCOREADY) IVPB 1500 mg/300 mL  Status:  Discontinued        1,500 mg 150 mL/hr over 120 Minutes Intravenous  Once 11/13/21 1412 11/13/21 1413       Subjective: Complaining of continued R foot pain and draining  Objective: Vitals:   11/13/21 2058 11/14/21 0131 11/14/21 0508 11/14/21 1406  BP: (!) 149/74 (!) 115/59 117/68 133/75  Pulse: (!) 105 98 (!) 101 88  Resp: 18 16 16  (!) 24  Temp:  99.7 F (37.6 C) 99.6 F (37.6 C)   TempSrc: Oral Oral Oral   SpO2: 97% 97% 95% 94%  Weight:      Height:        Intake/Output Summary (Last 24 hours) at 11/14/2021 1450 Last data filed at 11/14/2021 1200 Gross per 24 hour  Intake 580 ml  Output 2500 ml  Net -1920 ml   Filed Weights    11/13/21 1700  Weight: 90.5 kg    Examination: General exam: Awake, laying in bed, in nad Respiratory system: Normal respiratory effort, no wheezing Cardiovascular system: regular rate, s1, s2 Gastrointestinal system: Soft, nondistended, positive BS Central nervous system: CN2-12 grossly intact, strength intact Extremities: Perfused, no clubbing Skin: BLE edema with bullae, draining Psychiatry: Mood normal // no visual hallucinations   Data Reviewed: I have personally reviewed following labs and imaging studies  CBC: Recent Labs  Lab 11/07/21 1645 11/13/21 1301  WBC 10.4 8.7  NEUTROABS 4.7 5.5  HGB 15.3 14.9  HCT 44.8 44.1  MCV 95.7 95.9  PLT 250 283   Basic Metabolic Panel: Recent Labs  Lab 11/07/21 1540 11/13/21 1301  NA 140 134*  K 4.5 3.5  CL 103 96*  CO2 22 28  GLUCOSE 95 123*  BUN 18 12  CREATININE 1.16 1.01  CALCIUM 9.5 9.1   GFR: Estimated Creatinine Clearance: 94 mL/min (by C-G formula based on SCr of 1.01 mg/dL). Liver Function Tests: Recent Labs  Lab 11/13/21 1301  AST 22  ALT 17  ALKPHOS 78  BILITOT 1.1  PROT 8.4*  ALBUMIN 3.8   No results for input(s): LIPASE, AMYLASE in the last 168 hours. No results for input(s): AMMONIA in the last 168 hours. Coagulation Profile: Recent Labs  Lab 11/13/21 1301  INR 1.1   Cardiac Enzymes: No results for input(s): CKTOTAL, CKMB, CKMBINDEX, TROPONINI in the last 168 hours. BNP (last 3 results) No results for input(s): PROBNP in the last 8760 hours. HbA1C: Recent Labs    11/13/21 1301  HGBA1C 5.8*   CBG: Recent Labs  Lab 11/13/21 2056 11/14/21 0748 11/14/21 1219  GLUCAP 109* 102* 94   Lipid Profile: No results for input(s): CHOL, HDL, LDLCALC, TRIG, CHOLHDL, LDLDIRECT in the last 72 hours. Thyroid Function Tests: No results for input(s): TSH, T4TOTAL, FREET4, T3FREE, THYROIDAB in the last 72 hours. Anemia Panel: No results for input(s): VITAMINB12, FOLATE, FERRITIN, TIBC, IRON,  RETICCTPCT in the last 72 hours. Sepsis Labs: Recent Labs  Lab 11/13/21 1301 11/13/21 1501  LATICACIDVEN 1.5 1.5    Recent Results (from the past 240 hour(s))  Culture, blood (Routine x 2)     Status: None (Preliminary result)   Collection Time: 11/13/21  1:01 PM   Specimen: BLOOD  Result Value Ref Range Status   Specimen Description   Final    BLOOD BLOOD LEFT HAND Performed at [facility], [address], [place], [name]    Special Requests   Final    BOTTLES DRAWN AEROBIC AND ANAEROBIC Blood Culture results may not be optimal due to an excessive volume of blood received in culture bottles Performed at [facility], [address] [place], [name]    Culture   Final    NO GROWTH < 24 HOURS Performed at [facility], [address], [place],  [place] [address]    Report Status PENDING  Incomplete  Culture, blood (Routine x 2)     Status: None (Preliminary result)   Collection Time: 11/13/21  1:06 PM   Specimen: BLOOD  Result Value Ref Range Status   Specimen Description   Final    BLOOD LEFT ANTECUBITAL Performed at [facility] [address] [place], [place] [address]    Special Requests   Final    BOTTLES DRAWN AEROBIC AND ANAEROBIC Blood Culture adequate volume Performed at [facility] [address] [place], [place] [address]    Culture   Final    NO GROWTH < 24 HOURS Performed at [facility] [address], [place], [place] [address]    Report Status PENDING  Incomplete     Radiology Studies: DG Chest 2 View  Result Date: 11/13/2021 CLINICAL DATA:  Suspected sepsis EXAM: CHEST - 2 VIEW COMPARISON:  11/02/2021 FINDINGS: Apparent shift of mediastinum to the right may be due to rotation. Cardiac size is within normal limits. There are no signs of pulmonary edema or new focal infiltrates. There is no pleural effusion or pneumothorax. IMPRESSION: No  active cardiopulmonary disease. Electronically Signed   By: Elmer Picker M.D.   On: 11/13/2021 13:22    Scheduled Meds:  carvedilol  3.125 mg Oral BID   enoxaparin (LOVENOX) injection  40 mg Subcutaneous Q24H   furosemide  40 mg Intravenous BID   hydrocerin   Topical Daily   insulin aspart  0-15 Units Subcutaneous TID WC   OLANZapine  5 mg Oral QHS   sacubitril-valsartan  1 tablet Oral BID   spironolactone  12.5 mg Oral Daily   Continuous Infusions:   ceFAZolin (ANCEF) IV 1 g (11/14/21 0512)     LOS: 0 days   Marylu Lund, MD [facility] Pager On Amion  If 7PM-7AM, please contact night-coverage 11/14/2021, 2:50 PM

## 2021-11-14 NOTE — TOC Initial Note (Signed)
Transition of Care [facility]) - Initial/Assessment Note    Patient Details  Name: [name] MRN: [id number] Date of Birth: 06-17-1966  Transition of Care [facility]) CM/SW Contact:    Bartholome Bill, RN Phone Number: 11/14/2021, 1:17 PM  Clinical Narrative:                 It was reported to [facility] that pt was homeless. Shelter resources placed on AVS. Pt has stated that he does not like shelters in the past. He has been to [facility] and [facility] SNFs recently. He left [facility] AMA in August. Pt has a follow up appointment scheduled at the [facility] on 12/22. This was placed on his AVS. He was given the [facility] letter on his last admission on 12/10.  Expected Discharge Plan: Homeless Shelter Barriers to Discharge: Continued Medical Work up     Expected Discharge Plan and Services Expected Discharge Plan: Homeless Shelter   Discharge Planning Services: CM Consult   Living arrangements for the past 2 months: Homeless Shelter                   Prior Living Arrangements/Services Living arrangements for the past 2 months: Homeless Shelter                     Activities of Daily Living Home Assistive Devices/Equipment: Wheelchair ADL Screening (condition at time of admission) Patient's cognitive ability adequate to safely complete daily activities?: No Is the patient deaf or have difficulty hearing?: No Does the patient have difficulty seeing, even when wearing glasses/contacts?: No Does the patient have difficulty concentrating, remembering, or making decisions?: No Patient able to express need for assistance with ADLs?: No Does the patient have difficulty dressing or bathing?: Yes Independently performs ADLs?: No Communication: Needs assistance Dressing (OT): Needs assistance Is this a change from baseline?: Pre-admission baseline Grooming: Dependent Is this a change from baseline?: Pre-admission baseline Feeding: Independent Bathing: Needs  assistance Is this a change from baseline?: Pre-admission baseline Toileting: Needs assistance Is this a change from baseline?: Pre-admission baseline Walks in Home: Dependent Is this a change from baseline?: Pre-admission baseline Does the patient have difficulty walking or climbing stairs?: Yes Weakness of Legs: Both Weakness of Arms/Hands: Right  Permission Sought/Granted                  Emotional Assessment              Admission diagnosis:  Cellulitis of right lower extremity [L03.115] Cellulitis of right lower leg [L03.115] Patient Active Problem List   Diagnosis Date Noted   Cellulitis of right lower extremity 11/13/2021   Type 2 diabetes mellitus (HCC) 11/13/2021   Antisocial personality disorder (HCC) 11/04/2021   Malingering 11/04/2021   Foot pain 04/23/2021   Acute on chronic combined systolic (congestive) and diastolic (congestive) heart failure (HCC) 03/02/2021   History of stroke 03/02/2021   Substance use disorder 03/02/2021   Skin ulcer of right great toe with necrosis of bone (HCC)    Encounter for assessment of healthcare decision-making capacity    Pain, dental    Dystonia    Dysarthria    Shock liver 01/04/2021   Acute metabolic encephalopathy 01/04/2021   CAD (coronary artery disease) 01/04/2021   HTN (hypertension) 01/04/2021   Bipolar I disorder (HCC) 01/04/2021   Cocaine abuse (HCC) 01/04/2021   Homelessness 01/04/2021   HLD (hyperlipidemia) 01/04/2021   Osteomyelitis of great toe (HCC)    Ulcer  of great toe, left, with necrosis of bone (HCC)    Ulcer of great toe, right, with necrosis of muscle (HCC)    Acute osteomyelitis of toe, left (HCC)    Acute on chronic combined systolic and diastolic CHF (congestive heart failure) (HCC) 12/20/2020   Respiratory failure (HCC)    Ischemic stroke (HCC) 12/17/2020   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   Diabetic foot ulcer (HCC) 12/08/2020   CHF (congestive heart failure) (HCC) 11/14/2020   CHF  exacerbation (HCC) 11/13/2020   Hypokalemia    Hyperlipidemia    Acute respiratory failure with hypoxia (HCC) 10/26/2020   COPD with acute exacerbation (HCC) 10/14/2020   Acute on chronic combined systolic and diastolic CHF (congestive heart failure) (HCC) 10/02/2020   Near syncope 07/21/2020   Schizoaffective disorder (HCC) 06/24/2019   SOB (shortness of breath)    QT prolongation 05/25/2019   Cocaine abuse (HCC)    Elevated troponin    Homeless    Suicidal ideation    Bilateral foot pain 04/05/2019   Housing situation unstable 03/28/2019   Bilateral lower extremity edema    Asthma 12/26/2016   Tobacco use disorder 12/26/2016   Schizophrenia (HCC) 12/26/2016   Cocaine abuse with cocaine-induced mood disorder (HCC) 09/06/2016   Alcohol use disorder, mild, abuse 07/31/2016   Substance induced mood disorder (HCC) 07/30/2016   Schizoaffective disorder, bipolar type (HCC) 05/31/2016   Cocaine use disorder, severe, dependence (HCC) 05/31/2016   Hypertension 05/31/2016   PCP:  Pcp, No Pharmacy:   [facility] [place], [name] - [address] [name] [address] Phone: [phone] Fax: [phone]  [facility] [address] [name] [address] Phone: [phone] Fax: [phone]  [name] - [place], [name] - [address] AT [facility] [address] [name] [address] Phone: [phone] Fax: [phone]     Social Determinants of Health (SDOH) Interventions    Readmission Risk Interventions Readmission Risk Prevention Plan 03/05/2021 01/07/2021 12/10/2020  Transportation Screening Complete Complete Complete  PCP or Specialist Appt within 3-5 Days - Complete -  HRI or Home Care Consult - Complete -  Social Work Consult for Recovery Care Planning/Counseling - Complete -  Palliative Care Screening - Complete -  Medication Review Furniture conservator/restorer) Complete Complete Referral to Pharmacy  PCP or Specialist appointment within 3-5 days of discharge Complete - Complete  HRI or Home Care Consult Complete - Complete  SW Recovery Care/Counseling Consult Complete - Complete  Palliative Care Screening Not Applicable - Not Applicable  Skilled Nursing Facility Complete - Not Applicable  Some recent data might be hidden

## 2021-11-14 NOTE — Progress Notes (Signed)
Pt refused the 3 units of insulin according to orders.  MD notified.  Will continue to monitor.

## 2021-11-14 NOTE — Progress Notes (Signed)
Patient refused to have his blood sugar checked. Patient educated the importance of monitoring his sugars and continued to refuse.

## 2021-11-14 NOTE — Discharge Instructions (Signed)
Facility Address Phone Ext. Comments  [facility] [address]  [place] [phone]  DV Shelter   [facility] [address] [place], [place], [name] [address] [phone]  Men/Woman  [facility] [address]  [place] [phone]  DV Shelter  [facility] [phone] [address] [place] [phone]  Families' w/Children   [facility] [address] [place] [phone]  Men/Women/Families   [facility] [address] [place].  [place] [phone] Men and Women  [facility] [place] [phone]    pregnant/parenting girls and women  [facility]  [place] [phone]  ages [address] [place] [phone]  Youth ages 11-17  [facility] [address] [place] [phone]  Men  [facility] [phone]  [place] [phone]  Women  [facility] [address]  [place] [phone]  Single Women and Women w/Children  [facility] [address]  [place] [phone] 108 Men/Women/Families   [facility] [address]  [place] [phone]  DV Shelter  [facility] [address] [place]  [place] [phone] Men and Women  [facility] [address] [place] [place] [phone] Men  [facility] [address]  [place] [phone] Men  [facility] [address]  [place]  [place] [phone]  Single Women and Families  Room at the [facility]. [place] [phone] or  [phone]  Pregnant Women  [facility] [address]  [place]  [phone]  Men/Women and Families

## 2021-11-15 ENCOUNTER — Inpatient Hospital Stay (HOSPITAL_COMMUNITY): Payer: Medicaid - Out of State

## 2021-11-15 LAB — CBC
HCT: 35.6 % — ABNORMAL LOW (ref 39.0–52.0)
Hemoglobin: 12.1 g/dL — ABNORMAL LOW (ref 13.0–17.0)
MCH: 32.4 pg (ref 26.0–34.0)
MCHC: 34 g/dL (ref 30.0–36.0)
MCV: 95.4 fL (ref 80.0–100.0)
Platelets: 254 10*3/uL (ref 150–400)
RBC: 3.73 MIL/uL — ABNORMAL LOW (ref 4.22–5.81)
RDW: 13.4 % (ref 11.5–15.5)
WBC: 9.2 10*3/uL (ref 4.0–10.5)
nRBC: 0 % (ref 0.0–0.2)

## 2021-11-15 LAB — COMPREHENSIVE METABOLIC PANEL
ALT: 12 U/L (ref 0–44)
AST: 14 U/L — ABNORMAL LOW (ref 15–41)
Albumin: 2.9 g/dL — ABNORMAL LOW (ref 3.5–5.0)
Alkaline Phosphatase: 50 U/L (ref 38–126)
Anion gap: 12 (ref 5–15)
BUN: 13 mg/dL (ref 6–20)
CO2: 25 mmol/L (ref 22–32)
Calcium: 8.5 mg/dL — ABNORMAL LOW (ref 8.9–10.3)
Chloride: 100 mmol/L (ref 98–111)
Creatinine, Ser: 1.01 mg/dL (ref 0.61–1.24)
GFR, Estimated: >60 mL/min (ref >60)
Glucose, Bld: 105 mg/dL — ABNORMAL HIGH (ref 70–99)
Potassium: 3.6 mmol/L (ref 3.5–5.1)
Sodium: 137 mmol/L (ref 135–145)
Total Bilirubin: 0.5 mg/dL (ref 0.3–1.2)
Total Protein: 6.6 g/dL (ref 6.5–8.1)

## 2021-11-15 LAB — GLUCOSE, CAPILLARY: Glucose-Capillary: 130 mg/dL — ABNORMAL HIGH (ref 70–99)

## 2021-11-15 IMAGING — DX DG FOOT COMPLETE 3+V*L*
3 series · 3 of 3 positions shown · non-contrast
Comparison: [DATE]

CLINICAL DATA: Diabetic foot.  Cellulitis

EXAM:
LEFT FOOT - COMPLETE 3+ VIEW

[foot ap]
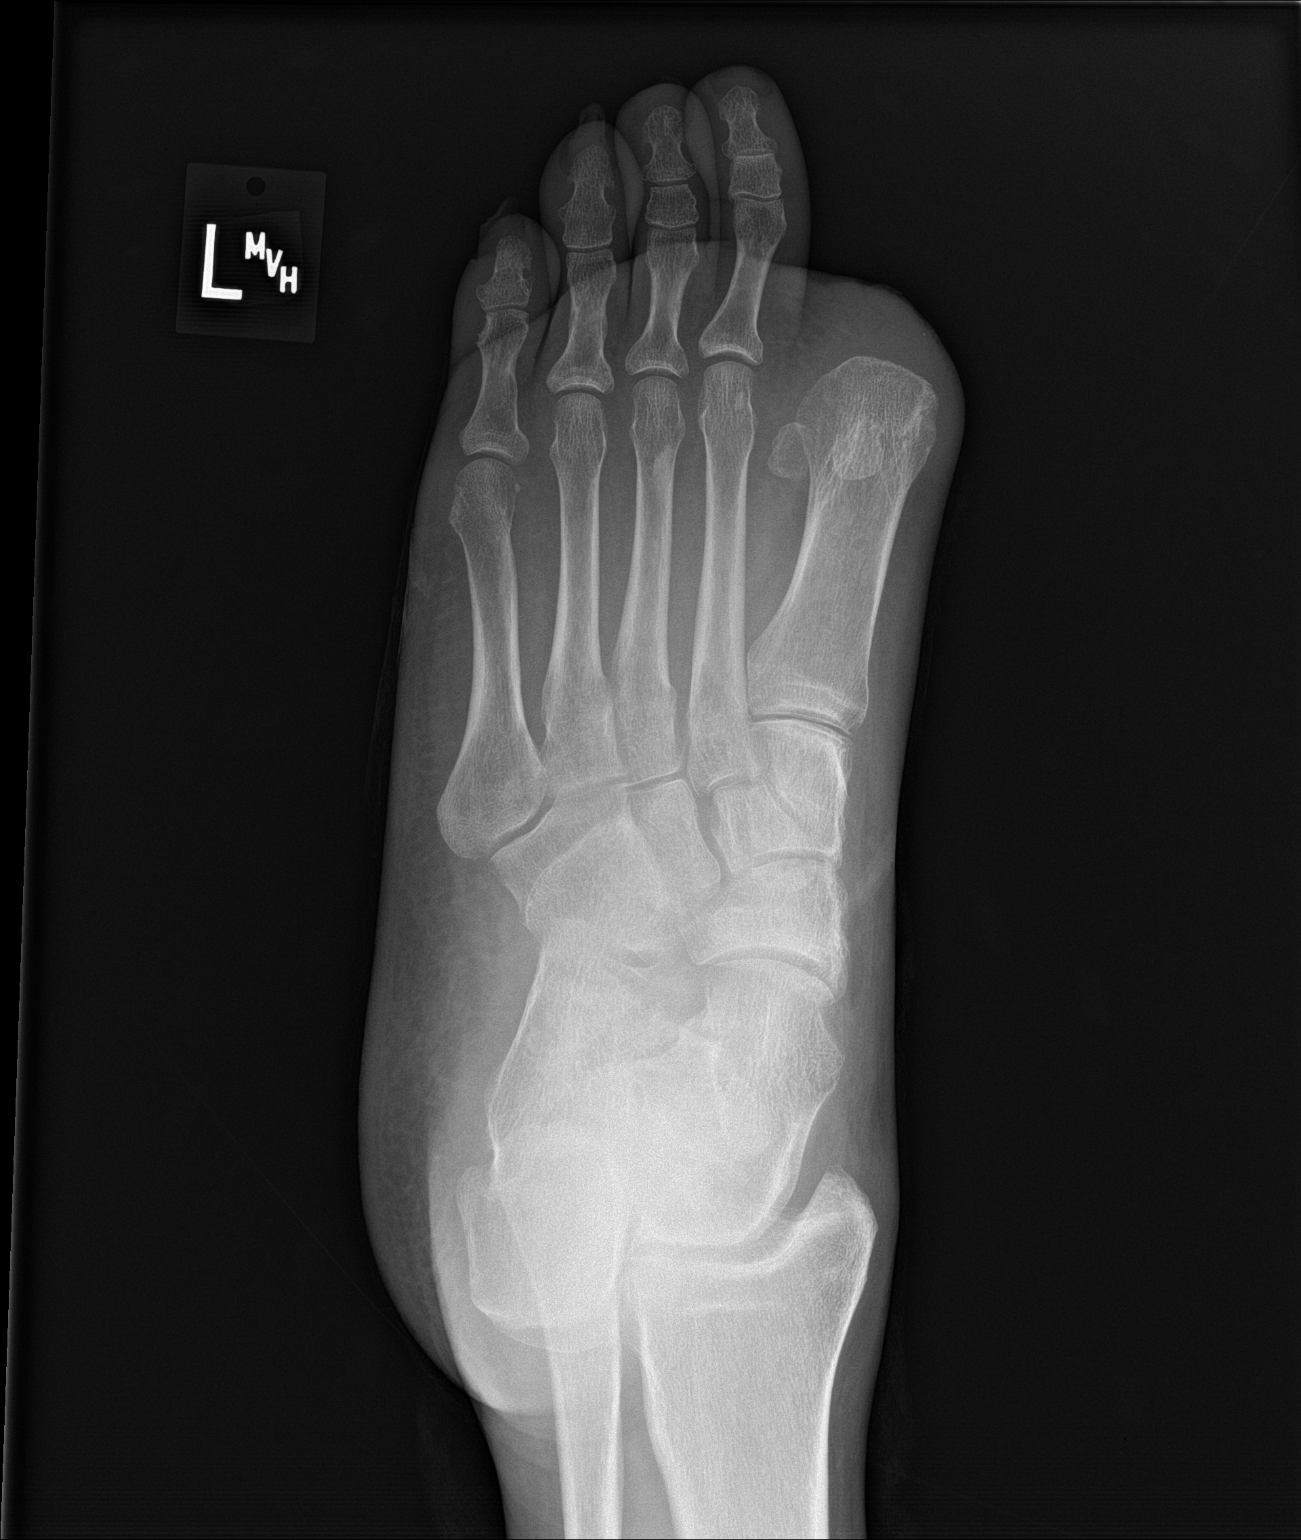

[foot obl]
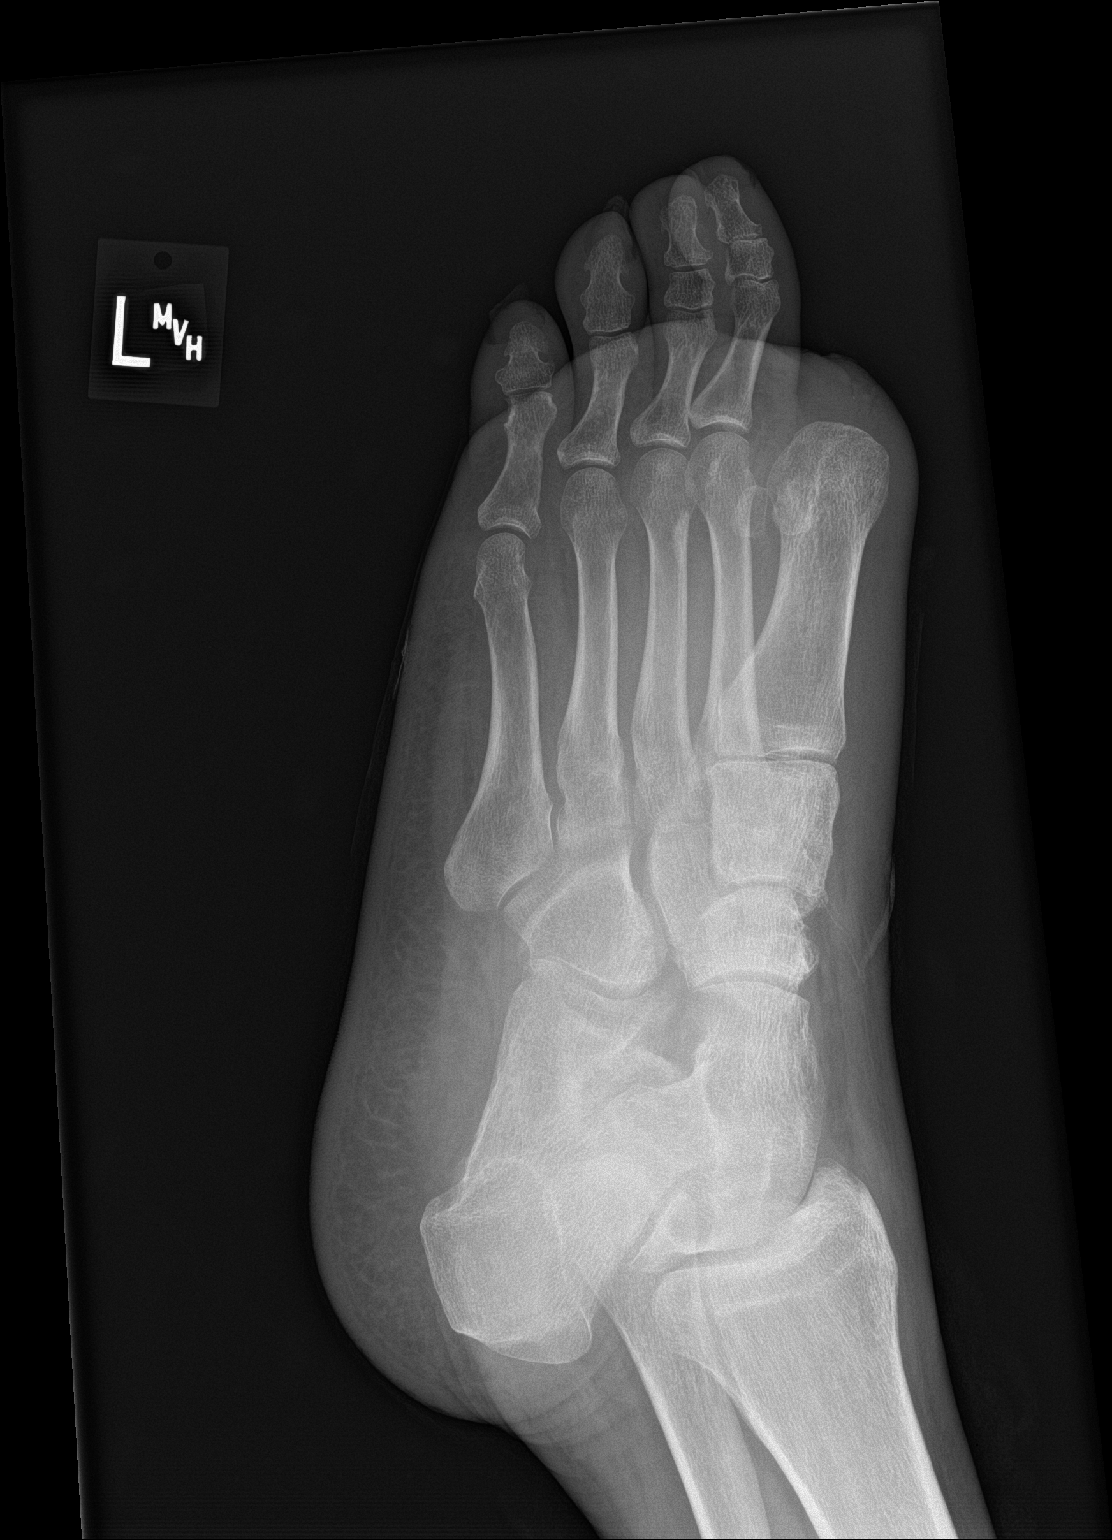

[foot lat]
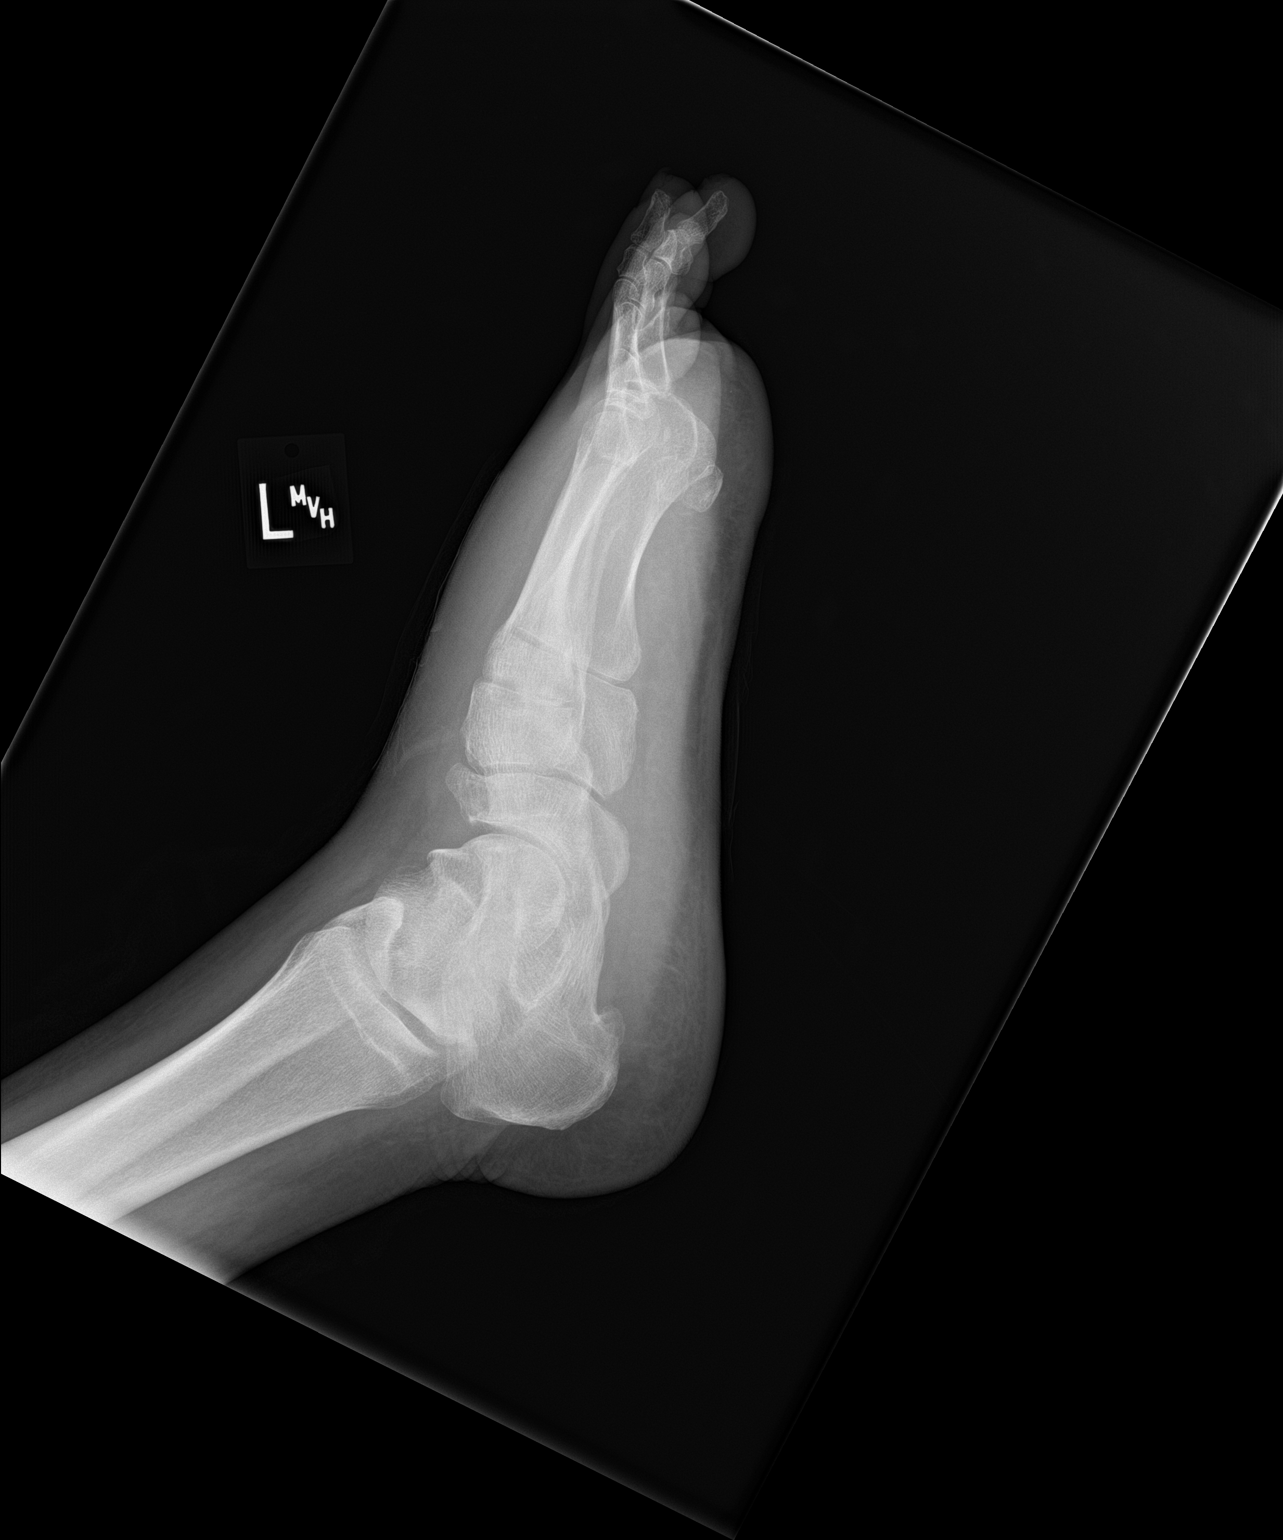

[3 of 3 positions shown; findings below may reference images not displayed]

FINDINGS: AP taken of the great toe at the level of the first MTP. No change
in appearance from the prior study. No fracture or osteomyelitis.
Remaining toes appear negative. Soft tissue swelling of the midfoot.
IMPRESSION: Negative for fracture or osteomyelitis. Amputation of the great toe.

Soft tissue swelling of the midfoot.

## 2021-11-15 IMAGING — DX DG FOOT COMPLETE 3+V*R*
3 series · 3 of 3 positions shown · non-contrast
Comparison: [DATE]

CLINICAL DATA: Diabetic foot.  Cellulitis

EXAM:
RIGHT FOOT COMPLETE - 3+ VIEW

[foot ap]
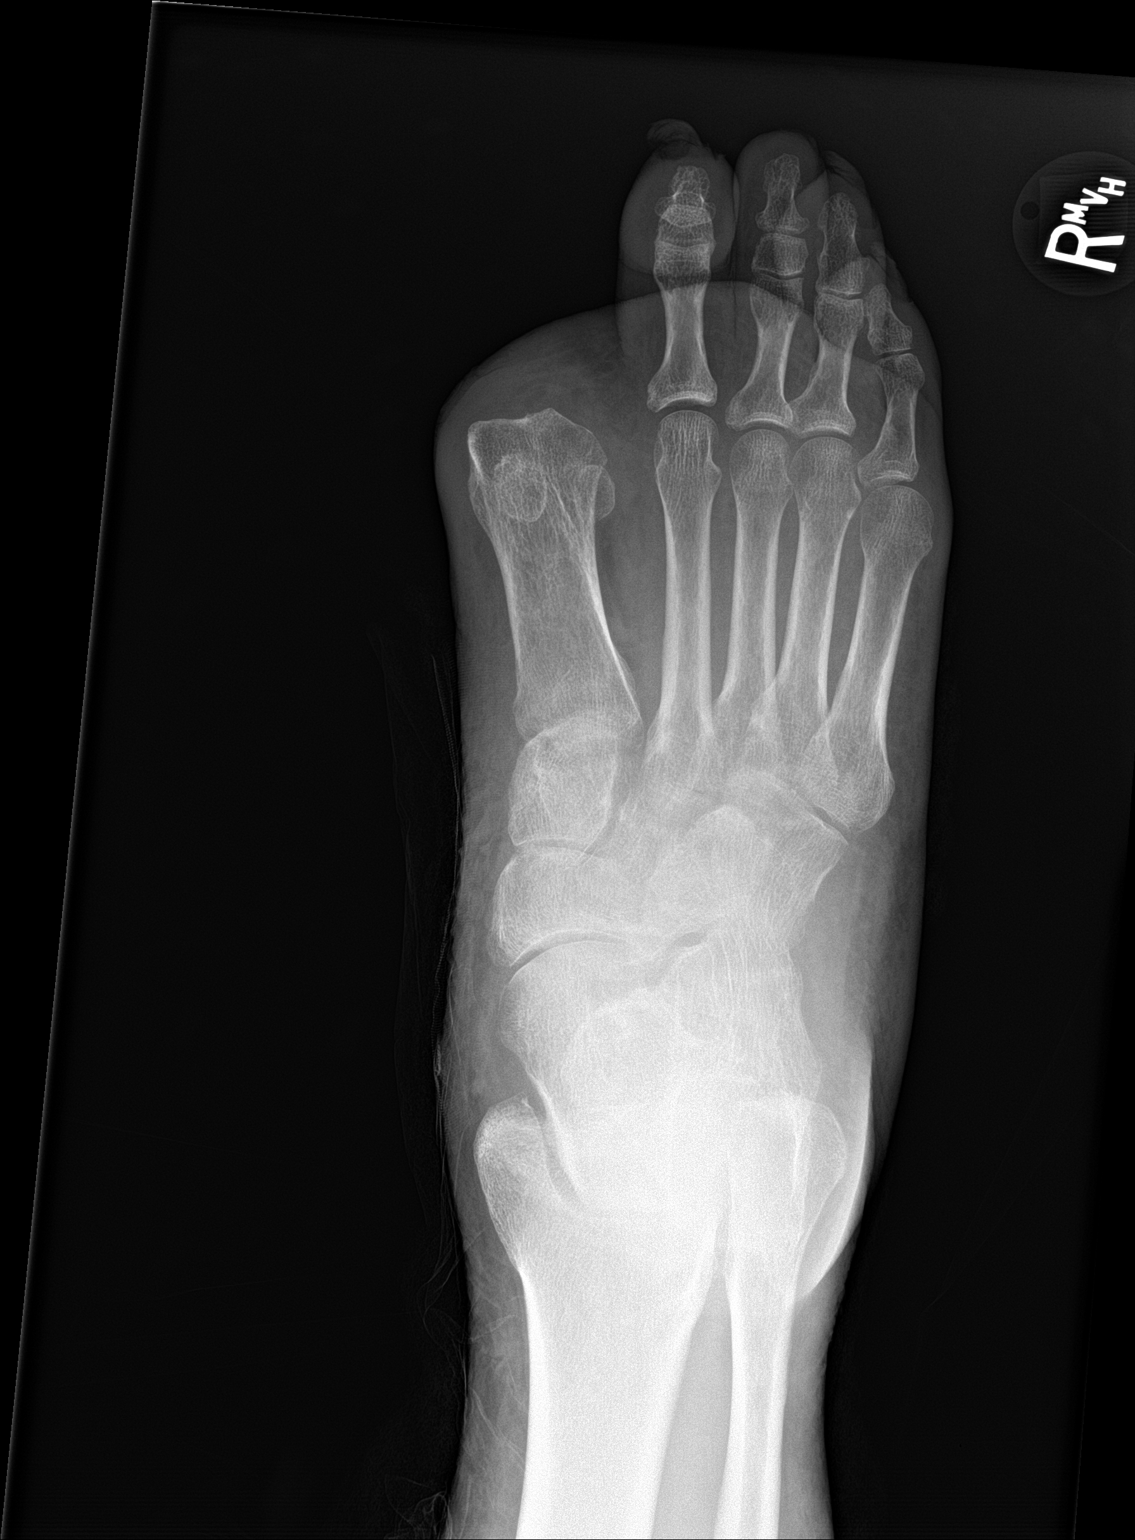

[foot obl]
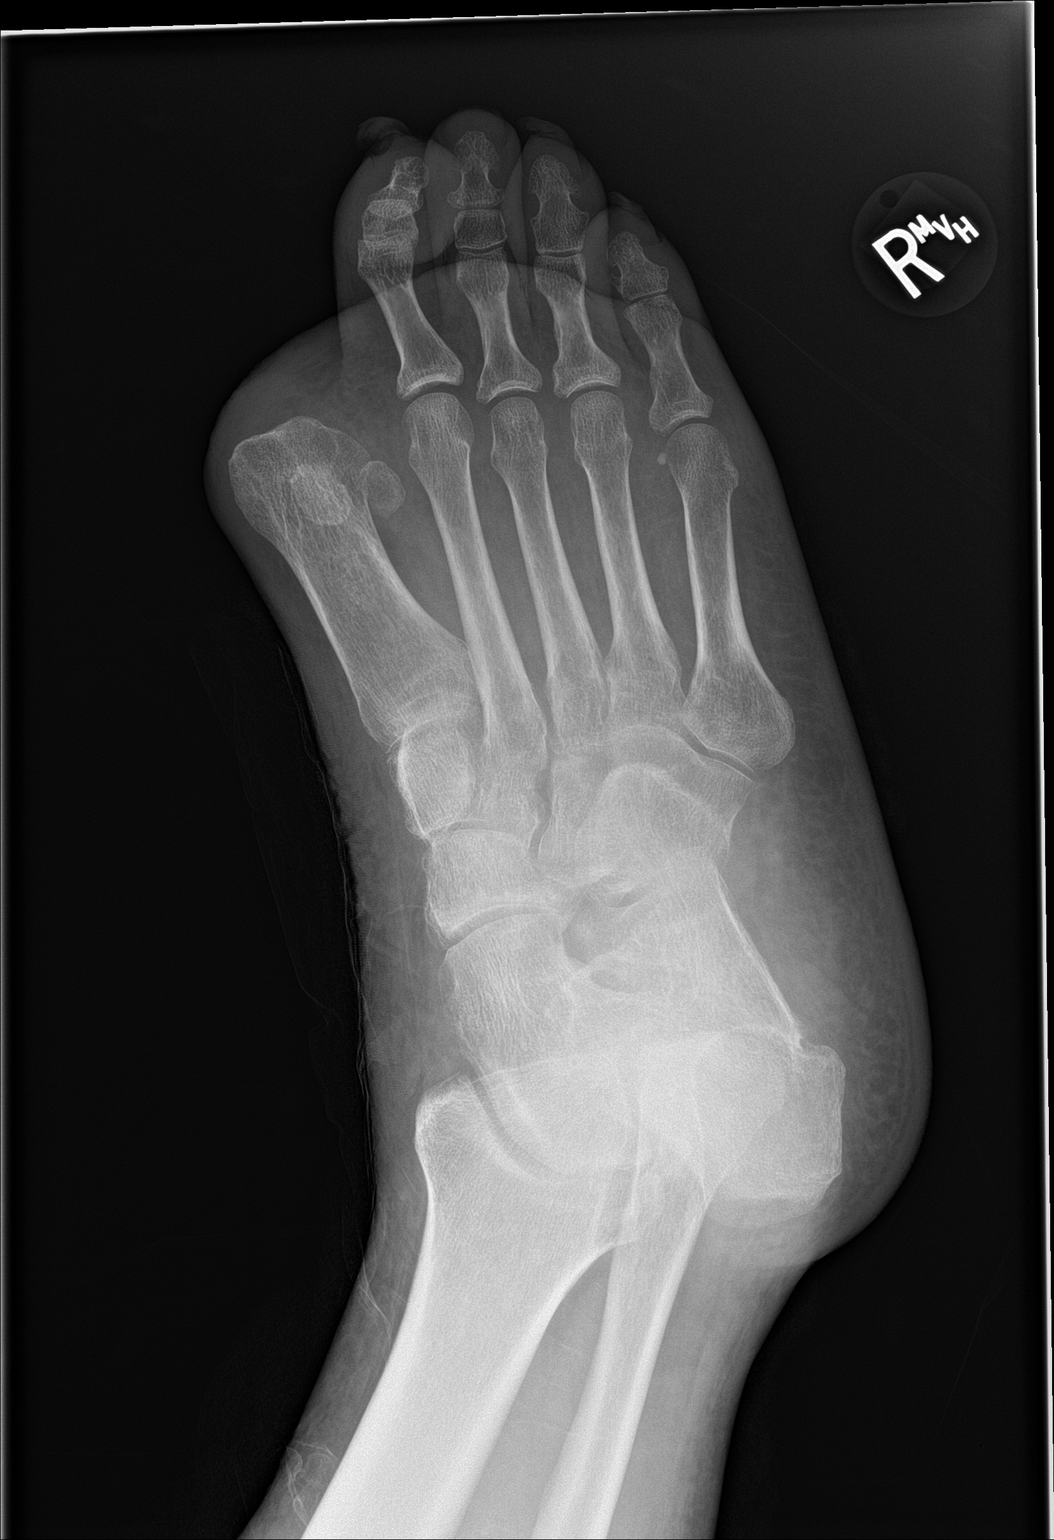

[foot lat]
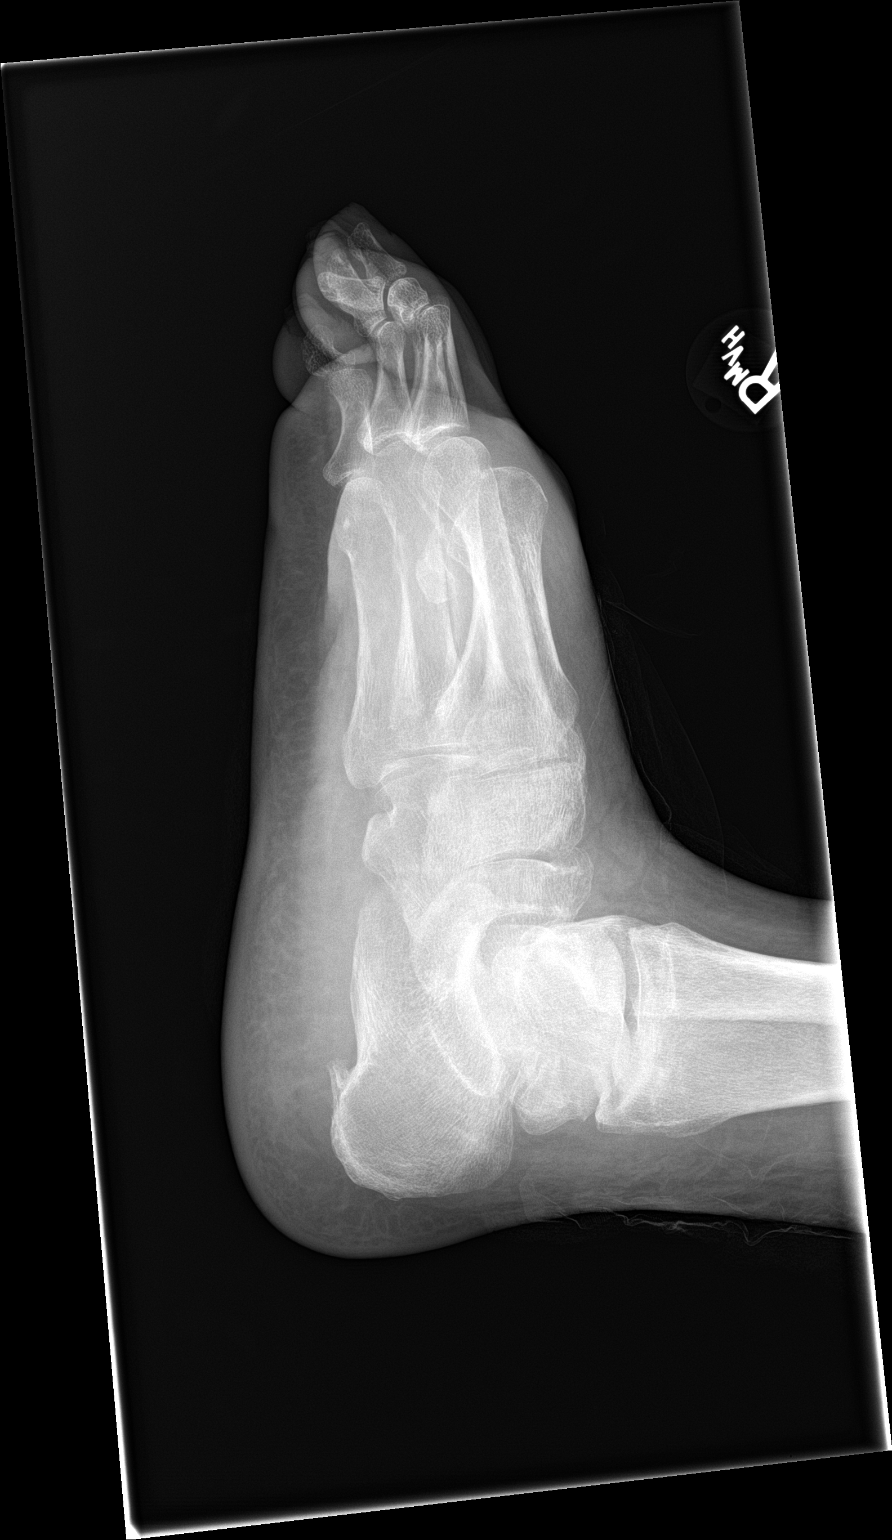

[3 of 3 positions shown; findings below may reference images not displayed]

FINDINGS: Amputation of the great toe at the level the first MTP. Negative for
fracture or osteomyelitis.

Remaining toes are negative.
IMPRESSION: Negative for fracture or osteomyelitis.  Amputation great toe.

## 2021-11-15 MED ORDER — ADULT MULTIVITAMIN W/MINERALS CH
1.0000 | ORAL_TABLET | Freq: Every day | ORAL | Status: DC
  Filled 2021-11-15 (×7): qty 1

## 2021-11-15 MED ORDER — CEFAZOLIN SODIUM-DEXTROSE 1-4 GM/50ML-% IV SOLN
1.0000 g | Freq: Three times a day (TID) | INTRAVENOUS | Status: DC
  Administered 2021-11-15 – 2021-11-16 (×2): 1 g via INTRAVENOUS
  Filled 2021-11-15 (×3): qty 50

## 2021-11-15 MED ORDER — JUVEN PO PACK
1.0000 | PACK | Freq: Two times a day (BID) | ORAL | Status: DC
  Filled 2021-11-15 (×14): qty 1

## 2021-11-15 MED ORDER — ENSURE ENLIVE PO LIQD
237.0000 mL | Freq: Two times a day (BID) | ORAL | Status: DC
  Administered 2021-11-16 – 2021-11-21 (×4): 237 mL via ORAL
  Filled 2021-11-15 (×2): qty 237

## 2021-11-15 NOTE — Progress Notes (Signed)
PROGRESS NOTE    [name]  PPJ:[id number] DOB: 1966-06-27 DOA: 11/13/2021 PCP: Pcp, No    Brief Narrative:   55 y.o. male with medical history significant of asthma, bipolar 1 disorder, depression, chronic combined systolic and diastolic CHF, COPD, GERD, homelessness, hypertension, QT prolongation, schizoaffective disorder who is coming to the emergency department due to worsening of right lower extremity cellulitis.  No new trauma to the area.  He denies fever, chills, night sweats feels malaised.  He does not know how his blood glucose have been doing.  He is not currently on diabetes medications.  No rhinorrhea, sore throat, dyspnea, productive cough, wheezing or hemoptysis.  Denied chest pain, palpitations, diaphoresis, PND, orthopnea or pitting edema of the lower extremities.  No dysuria, frequency or hematuria.  No polyuria, polydipsia, polyphagia or blurred vision.  Assessment & Plan:   Principal Problem:   Cellulitis of right lower extremity Active Problems:   Hypertension   CHF exacerbation (HCC)   CAD (coronary artery disease)   Bipolar I disorder (HCC)   Homelessness   HLD (hyperlipidemia)   Type 2 diabetes mellitus (HCC)  Principal Problem:   Possible Cellulitis of right lower extremity  No significant erythema appreciated. Afebrile. No leukocytosis Marked LE edema bilaterally with blisters, some open Given hx of chf not currently treated, suspect acute CHF exacerbation with pain secondary to LE edema Was continued on cefazolin 1 g every 8 hours, currently on hold Analgesics as needed. Antiemetics as needed. LE edema improving however now with bloody drainage. Given diabetes history, pt is at very high risk for infection Have ordered B foot xrays   Active Problems:   Type 2 diabetes Mellitus Carbohydrate modified diet. Check hemoglobin A1c. CBG monitoring with RI SS although pt has been refusing finger sticks Cont to recommend follow-up with PCP as an  outpatient.     Hypertension On carvedilol 3.125 mg p.o. twice daily. All also on Entresto twice daily. BP remains stable    Acute on Chronic combined systolic and diastolic HF Continue carvedilol as above. Continue Entresto twice daily. Most recent EF of <20% with grade 2 diastolic dysfunction Presented with marked BLE edema with blistering Edema improving with IV lasix     CAD (coronary artery disease) Allergic to aspirin. Cannot for clopidogrel. Still smoking and not taking statin. Continue carvedilol 3.125 mg p.o. twice daily. Pt with recurrent tox screens pos for cocaine. Will need cessation     Bipolar I disorder (HCC) Continue Zyprexa 5 mg p.o. bedtime.     Homelessness Consult TOC.     HLD (hyperlipidemia) No longer on statin. Follow-up with primary care provider when discharged.   DVT prophylaxis: Lovenox subq Code Status: Full Family Communication: Pt in room, family not at bedside  Status is: Inpatient  Requires continued inpatient status because: Severity of illness    Consultants:    Procedures:    Antimicrobials: Anti-infectives (From admission, onward)    Start     Dose/Rate Route Frequency Ordered Stop   11/13/21 1500  ceFAZolin (ANCEF) IVPB 1 g/50 mL premix  Status:  Discontinued        1 g 100 mL/hr over 30 Minutes Intravenous Every 8 hours 11/13/21 1458 11/14/21 1507   11/13/21 1415  vancomycin (VANCOREADY) IVPB 1500 mg/300 mL  Status:  Discontinued        1,500 mg 150 mL/hr over 120 Minutes Intravenous  Once 11/13/21 1412 11/13/21 1413       Subjective: Still complaining of BLE pain  with even minimal palpation. Swelling improved  Objective: Vitals:   11/14/21 0508 11/14/21 1406 11/14/21 2208 11/15/21 0508  BP: 117/68 133/75 (!) 145/73 138/82  Pulse: (!) 101 88 96 73  Resp: 16 (!) 24 18 18   Temp: 99.6 F (37.6 C)  100.2 F (37.9 C) 98.3 F (36.8 C)  TempSrc: Oral  Oral Oral  SpO2: 95% 94% 99% 96%  Weight:      Height:         Intake/Output Summary (Last 24 hours) at 11/15/2021 1415 Last data filed at 11/15/2021 0540 Gross per 24 hour  Intake 720 ml  Output 3300 ml  Net -2580 ml    Filed Weights   11/13/21 1700  Weight: 90.5 kg    Examination: General exam: Conversant, in no acute distress Respiratory system: normal chest rise, clear, no audible wheezing Cardiovascular system: regular rhythm, s1-s2 Gastrointestinal system: Nondistended, nontender, pos BS Central nervous system: No seizures, no tremors Extremities: BLE edema improving, skin tears of RLE with blood seen through bandage Skin: No rashes, no pallor Psychiatry: Affect normal // no auditory hallucinations   Data Reviewed: I have personally reviewed following labs and imaging studies  CBC: Recent Labs  Lab 11/13/21 1301 11/15/21 0436  WBC 8.7 9.2  NEUTROABS 5.5  --   HGB 14.9 12.1*  HCT 44.1 35.6*  MCV 95.9 95.4  PLT 283 0000000    Basic Metabolic Panel: Recent Labs  Lab 11/13/21 1301 11/15/21 0436  NA 134* 137  K 3.5 3.6  CL 96* 100  CO2 28 25  GLUCOSE 123* 105*  BUN 12 13  CREATININE 1.01 1.01  CALCIUM 9.1 8.5*    GFR: Estimated Creatinine Clearance: 94 mL/min (by C-G formula based on SCr of 1.01 mg/dL). Liver Function Tests: Recent Labs  Lab 11/13/21 1301 11/15/21 0436  AST 22 14*  ALT 17 12  ALKPHOS 78 50  BILITOT 1.1 0.5  PROT 8.4* 6.6  ALBUMIN 3.8 2.9*    No results for input(s): LIPASE, AMYLASE in the last 168 hours. No results for input(s): AMMONIA in the last 168 hours. Coagulation Profile: Recent Labs  Lab 11/13/21 1301  INR 1.1    Cardiac Enzymes: No results for input(s): CKTOTAL, CKMB, CKMBINDEX, TROPONINI in the last 168 hours. BNP (last 3 results) No results for input(s): PROBNP in the last 8760 hours. HbA1C: Recent Labs    11/13/21 1301  HGBA1C 5.8*    CBG: Recent Labs  Lab 11/13/21 2056 11/14/21 0748 11/14/21 1219 11/14/21 1631  GLUCAP 109* 102* 94 187*    Lipid  Profile: No results for input(s): CHOL, HDL, LDLCALC, TRIG, CHOLHDL, LDLDIRECT in the last 72 hours. Thyroid Function Tests: No results for input(s): TSH, T4TOTAL, FREET4, T3FREE, THYROIDAB in the last 72 hours. Anemia Panel: No results for input(s): VITAMINB12, FOLATE, FERRITIN, TIBC, IRON, RETICCTPCT in the last 72 hours. Sepsis Labs: Recent Labs  Lab 11/13/21 1301 11/13/21 1501  LATICACIDVEN 1.5 1.5     Recent Results (from the past 240 hour(s))  Culture, blood (Routine x 2)     Status: None (Preliminary result)   Collection Time: 11/13/21  1:01 PM   Specimen: BLOOD  Result Value Ref Range Status   Specimen Description   Final    BLOOD BLOOD LEFT HAND Performed at [facility], [place] [address], [place], [place] [address]    Special Requests   Final    BOTTLES DRAWN AEROBIC AND ANAEROBIC Blood Culture results may not be optimal due  to an excessive volume of blood received in culture bottles Performed at [facility] [address] [place], [place] [address]    Culture   Final    NO GROWTH 2 DAYS Performed at [facility] [address], [place], [place] [address]    Report Status PENDING  Incomplete  Culture, blood (Routine x 2)     Status: None (Preliminary result)   Collection Time: 11/13/21  1:06 PM   Specimen: BLOOD  Result Value Ref Range Status   Specimen Description   Final    BLOOD LEFT ANTECUBITAL Performed at [facility] [address] [place], [place] [address]    Special Requests   Final    BOTTLES DRAWN AEROBIC AND ANAEROBIC Blood Culture adequate volume Performed at [facility] [address], [place], [place] [address]    Culture   Final    NO GROWTH 2 DAYS Performed at [facility] [address] [place], [place] [address]    Report Status PENDING  Incomplete      Radiology Studies: No results found.  Scheduled Meds:  carvedilol  3.125 mg Oral BID    enoxaparin (LOVENOX) injection  40 mg Subcutaneous Q24H   feeding supplement  237 mL Oral BID BM   furosemide  40 mg Intravenous BID   hydrocerin   Topical Daily   insulin aspart  0-15 Units Subcutaneous TID WC   multivitamin with minerals  1 tablet Oral Daily   nutrition supplement (JUVEN)  1 packet Oral BID BM   OLANZapine  5 mg Oral QHS   sacubitril-valsartan  1 tablet Oral BID   spironolactone  12.5 mg Oral Daily   Continuous Infusions:     LOS: 1 day   Marylu Lund, MD [facility] Pager On Amion  If 7PM-7AM, please contact night-coverage 11/15/2021, 2:15 PM

## 2021-11-15 NOTE — Progress Notes (Signed)
Initial Nutrition Assessment  INTERVENTION:   -Ensure Enlive po BID, each supplement provides 350 kcal and 20 grams of protein  -Multivitamin with minerals daily  -1 packet Juven BID, each packet provides 95 calories, 2.5 grams of protein (collagen), and 9.8 grams of carbohydrate (3 grams sugar); also contains 7 grams of L-arginine and L-glutamine, 300 mg vitamin C, 15 mg vitamin E, 1.2 mcg vitamin B-12, 9.5 mg zinc, 200 mg calcium, and 1.5 g  Calcium Beta-hydroxy-Beta-methylbutyrate to support wound healing   -Double protein portions with lunch and dinner   NUTRITION DIAGNOSIS:   Increased nutrient needs related to wound healing as evidenced by estimated needs.  GOAL:   Patient will meet greater than or equal to 90% of their needs  MONITOR:   PO intake, Supplement acceptance, Labs, Weight trends, I & O's, Skin  REASON FOR ASSESSMENT:   Consult Assessment of nutrition requirement/status  ASSESSMENT:   55 y.o. male with medical history significant of asthma, bipolar 1 disorder, depression, chronic combined systolic and diastolic CHF, COPD, GERD, homelessness, hypertension, QT prolongation, schizoaffective disorder who is coming to the emergency department due to worsening of right lower extremity cellulitis.  Patient in room, consumed 100% of his lunch meal. Per his diet order he is supposed to receive double portions with his lunch but he only got 1 portion of his lunch items. Ordered a second tray for patient and will add that pt is receive double protein portions for dinner. Pt is declining blood sugar checks.  Denies any issues chewing or swallowing.  He is agreeable to receiving Ensure supplements for extra protein and will order Juven to aid in wound healing. Pt uses a wheelchair.   Per weight records, weight is stable.  Medications: Lasix, Multivitamin with minerals daily  Labs reviewed: CBGs: 94-187   NUTRITION - FOCUSED PHYSICAL EXAM:  Flowsheet Row Most  Recent Value  Orbital Region No depletion  Upper Arm Region No depletion  Thoracic and Lumbar Region No depletion  Buccal Region No depletion  Temple Region Mild depletion  Clavicle Bone Region No depletion  Clavicle and Acromion Bone Region No depletion  Scapular Bone Region No depletion  Dorsal Hand No depletion  Patellar Region No depletion  Anterior Thigh Region No depletion  Posterior Calf Region No depletion  Edema (RD Assessment) Mild  Hair Reviewed  Eyes Reviewed  [yellow]  Mouth Reviewed  Skin Reviewed       Diet Order:   Diet Order             Diet Heart Room service appropriate? Yes; Fluid consistency: Thin  Diet effective now                   EDUCATION NEEDS:   Education needs have been addressed  Skin:  Skin Assessment: Reviewed RN Assessment  Last BM:  PTA  Height:   Ht Readings from Last 1 Encounters:  11/13/21 5\' 9"  (1.753 m)    Weight:   Wt Readings from Last 1 Encounters:  11/13/21 90.5 kg    BMI:  Body mass index is 29.46 kg/m.  Estimated Nutritional Needs:   Kcal:  2500-2700  Protein:  110-120g  Fluid:  2L/day   11/15/21, MS, RD, LDN Inpatient Clinical Dietitian Contact information available via Amion

## 2021-11-16 ENCOUNTER — Inpatient Hospital Stay (HOSPITAL_COMMUNITY): Payer: Medicaid - Out of State

## 2021-11-16 ENCOUNTER — Encounter (HOSPITAL_COMMUNITY): Payer: Self-pay | Admitting: Internal Medicine

## 2021-11-16 DIAGNOSIS — I2510 Atherosclerotic heart disease of native coronary artery without angina pectoris: Secondary | ICD-10-CM

## 2021-11-16 LAB — CBC
HCT: 37.6 % — ABNORMAL LOW (ref 39.0–52.0)
Hemoglobin: 12.7 g/dL — ABNORMAL LOW (ref 13.0–17.0)
MCH: 32.1 pg (ref 26.0–34.0)
MCHC: 33.8 g/dL (ref 30.0–36.0)
MCV: 94.9 fL (ref 80.0–100.0)
Platelets: 299 10*3/uL (ref 150–400)
RBC: 3.96 MIL/uL — ABNORMAL LOW (ref 4.22–5.81)
RDW: 13.2 % (ref 11.5–15.5)
WBC: 6.8 10*3/uL (ref 4.0–10.5)
nRBC: 0 % (ref 0.0–0.2)

## 2021-11-16 LAB — COMPREHENSIVE METABOLIC PANEL
ALT: 13 U/L (ref 0–44)
AST: 16 U/L (ref 15–41)
Albumin: 3 g/dL — ABNORMAL LOW (ref 3.5–5.0)
Alkaline Phosphatase: 50 U/L (ref 38–126)
Anion gap: 10 (ref 5–15)
BUN: 17 mg/dL (ref 6–20)
CO2: 26 mmol/L (ref 22–32)
Calcium: 8.7 mg/dL — ABNORMAL LOW (ref 8.9–10.3)
Chloride: 100 mmol/L (ref 98–111)
Creatinine, Ser: 1.06 mg/dL (ref 0.61–1.24)
GFR, Estimated: >60 mL/min (ref >60)
Glucose, Bld: 132 mg/dL — ABNORMAL HIGH (ref 70–99)
Potassium: 3.7 mmol/L (ref 3.5–5.1)
Sodium: 136 mmol/L (ref 135–145)
Total Bilirubin: 0.5 mg/dL (ref 0.3–1.2)
Total Protein: 7.2 g/dL (ref 6.5–8.1)

## 2021-11-16 LAB — GLUCOSE, CAPILLARY
Glucose-Capillary: 185 mg/dL — ABNORMAL HIGH (ref 70–99)
Glucose-Capillary: 109 mg/dL — ABNORMAL HIGH (ref 70–99)
Glucose-Capillary: 154 mg/dL — ABNORMAL HIGH (ref 70–99)

## 2021-11-16 IMAGING — CT CT TIBIA FIBULA *R* W/ CM
3 of 6 series · 11 of 33 positions shown, 13 images · IV contrast (omnipaque)
Comparison: Right foot radiograph [DATE]

CLINICAL DATA: Soft tissue infection suspected, lower leg, xray
done; Foot swelling, diabetic, osteomyelitis suspected, xray done

EXAM:
CT OF THE LOWER RIGHT EXTREMITY WITH CONTRAST
CT OF THE RIGHT FOOT WITH CONTRAST
TECHNIQUE: Multidetector CT imaging of the lower right extremity and right foot
was performed according to the standard protocol following
intravenous contrast administration.
CONTRAST:  100mL OMNIPAQUE IOHEXOL 350 MG/ML SOLN

[Series 6: axial bone · axial · 0.46mm/px · z∈[+406,+860]mm · 5 of 454 slices shown, 7 images]
[im 76/454  soft-tissue]
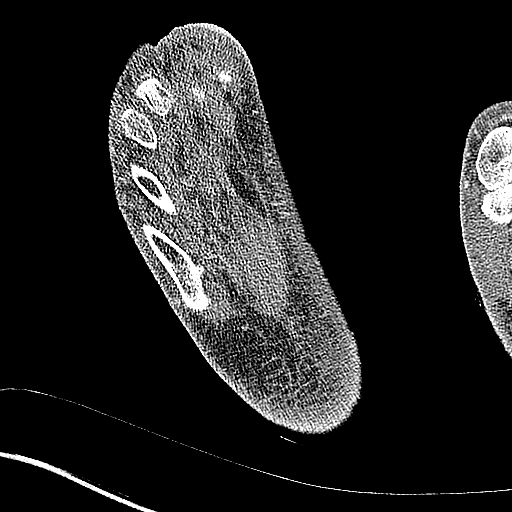
[im 76/454  bone]
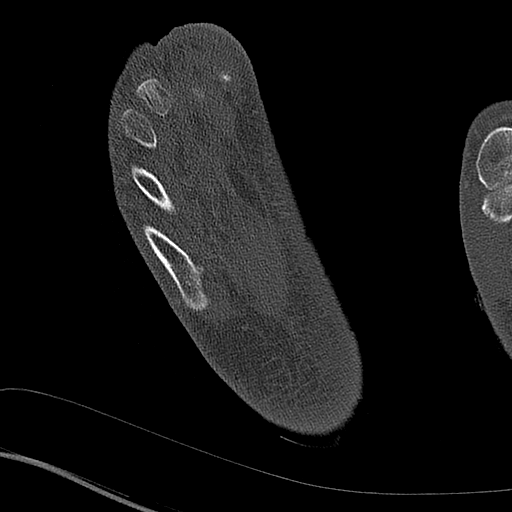
[im 152/454  bone]
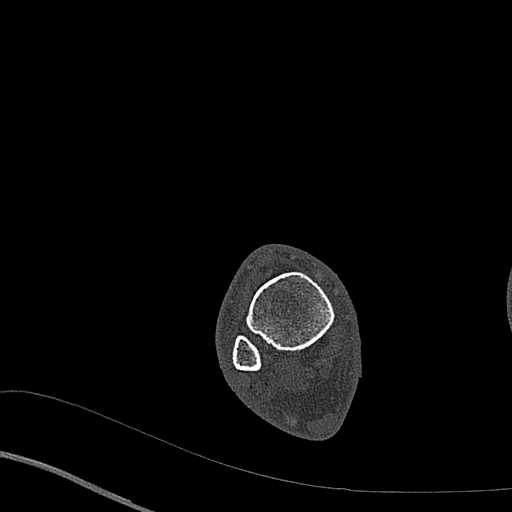
[im 227/454  bone]
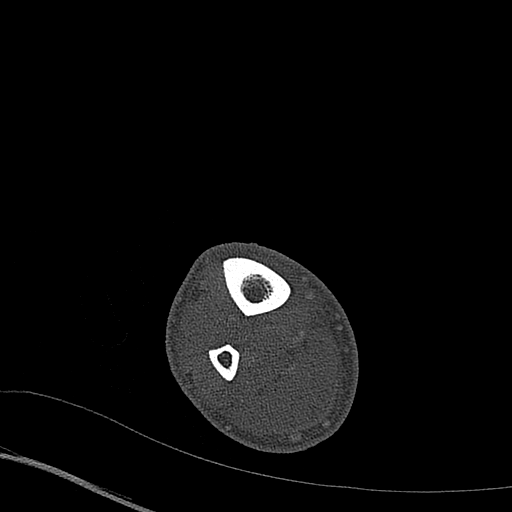
[im 303/454  bone]
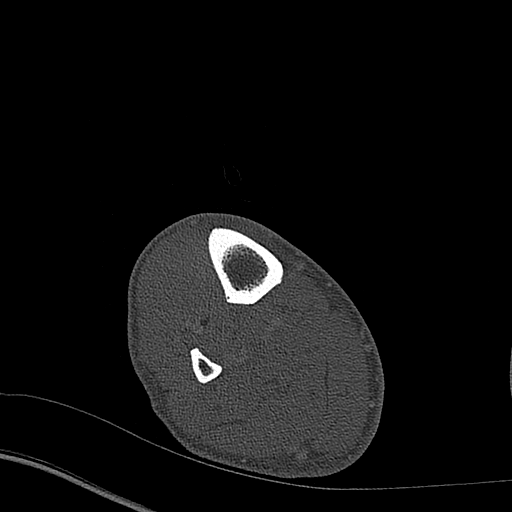
[im 378/454  soft-tissue]
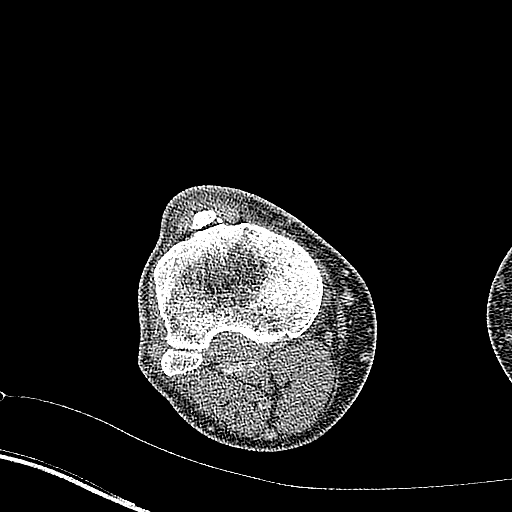
[im 378/454  bone]
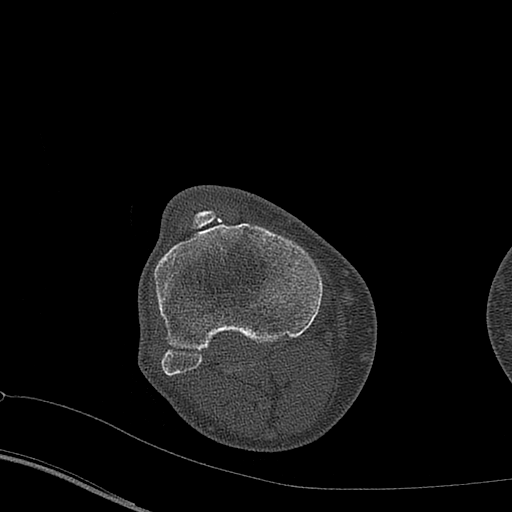

[Series 8: coronal bone · coronal · 0.50mm/px · 1 of 248 slices shown]
[im 124/248  bone]
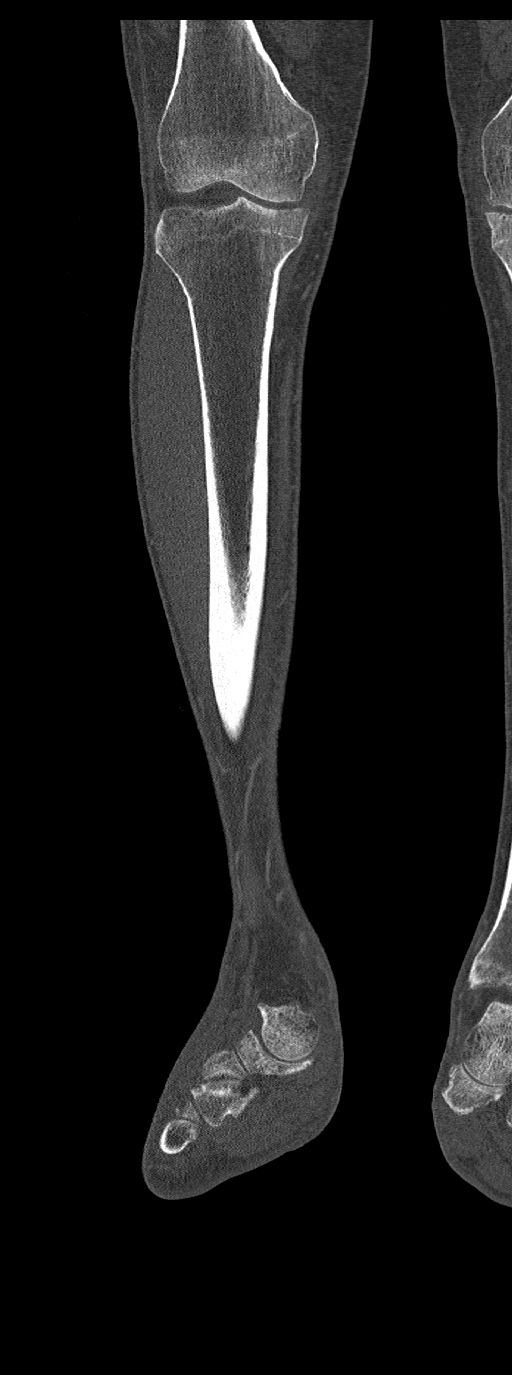

[Series 11: sagittal st · sagittal · 0.74mm/px · 5 of 138 slices shown]
[im 23/138  bone]
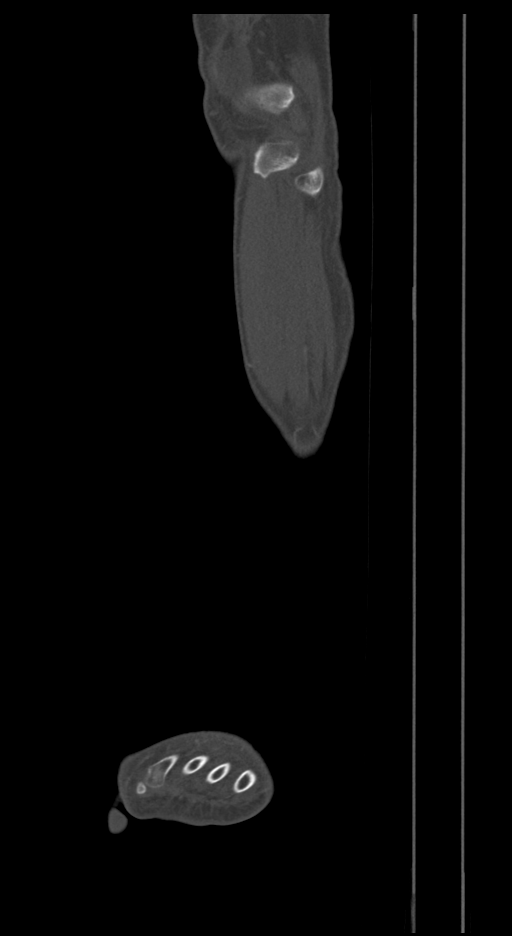
[im 46/138  bone]
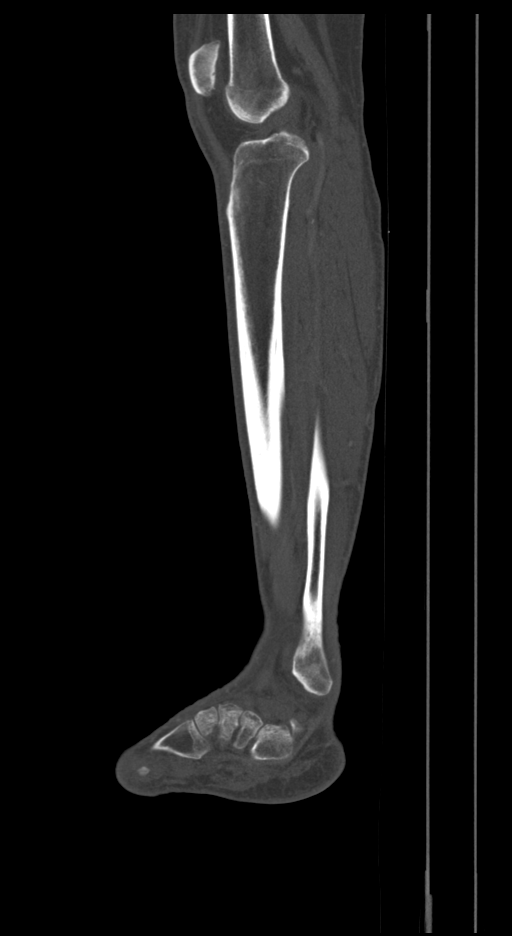
[im 69/138  bone]
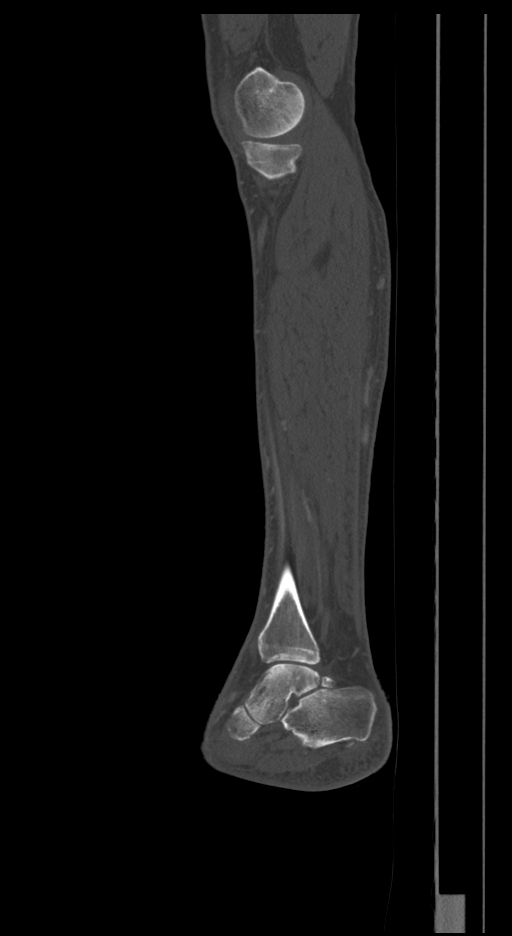
[im 92/138  bone]
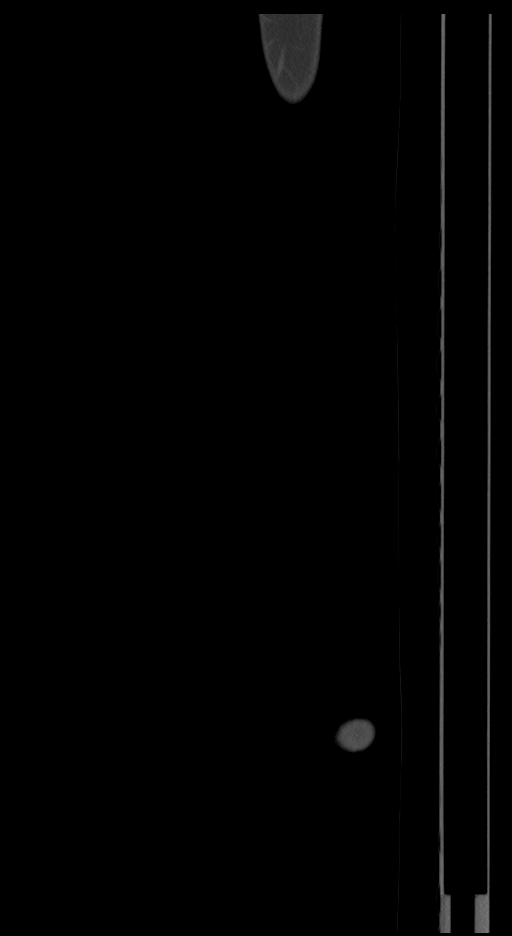
[im 115/138  bone]
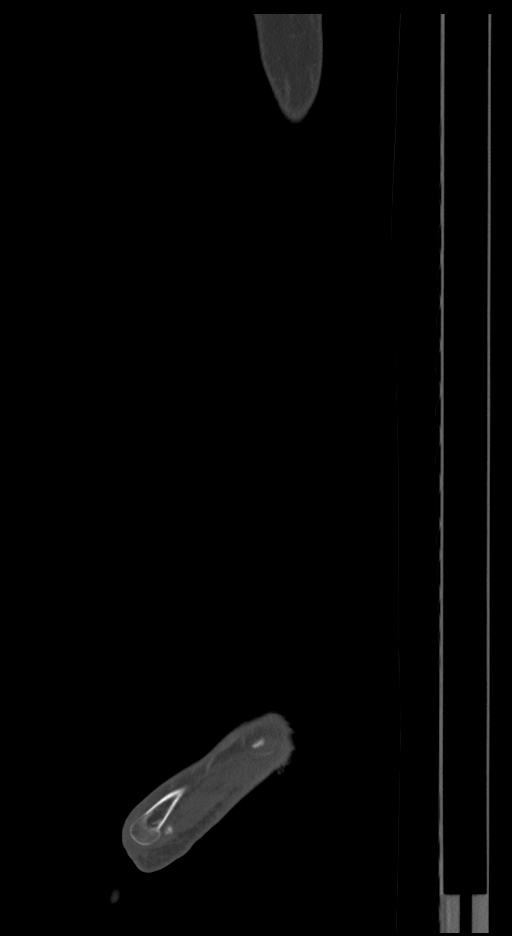

[11 of 33 positions shown; findings below may reference images not displayed]

FINDINGS: Bones/Joint/Cartilage

Prior great toe amputation. No frank bony destruction in the foot.
Mild hindfoot osteoarthritis. Os trigonum. Plantar calcaneal spur.
Dorsal midfoot spurring.

There is no acute osseous abnormality involving the tibia or fibula.
There is chronic tibial tuberosity fragmentation likely sequela of
prior Osgood-Schlatter. There is no frank bony destruction to
suggest osteomyelitis on CT.

There is mild medial patellofemoral predominant osteoarthritis of
the knee.

Ligaments

Suboptimally assessed by CT.

Muscles and Tendons

No muscle atrophy.  No intramuscular collection.

Soft tissues

There is skin thickening and soft tissue swelling of the right foot
and to a lesser degree of the lower leg. There is no rim enhancing
fluid collection. No soft tissue gas.
IMPRESSION: Skin thickening and soft tissue swelling of the right foot and to a
lesser degree of the right lower leg, as can be seen in cellulitis
or lymphedema. Prior great toe amputation with adjacent soft tissue
thickening. No evidence of soft tissue abscess or bony destruction
to suggest osteomyelitis on CT. MRI would be more sensitive.

## 2021-11-16 IMAGING — CT CT TIBIA FIBULA *L* W/ CM
3 of 7 series · 10 of 33 positions shown, 11 images · IV contrast (omnipaque)
Comparison: Left foot radiograph [DATE]

CLINICAL DATA: Insert his

EXAM:
CT OF THE LOWER LEFT EXTREMITY WITH CONTRAST
CT OF THE LOWER LEFT FOOT WITH CONTRAST
TECHNIQUE: Multidetector CT imaging of the lower left extremity was performed
according to the standard protocol following intravenous contrast
administration.
CONTRAST:  100mL OMNIPAQUE IOHEXOL 350 MG/ML SOLN

[Series 1: thins st left · axial · 0.48mm/px · z∈[+430,+838]mm · 4 of 681 slices shown, 5 images]
[im 137/681  soft-tissue]
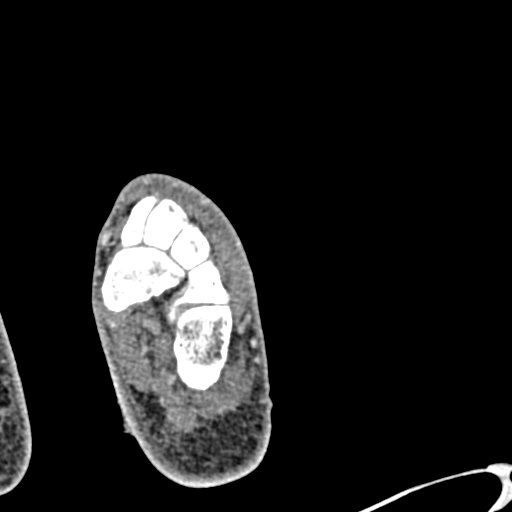
[im 137/681  bone]
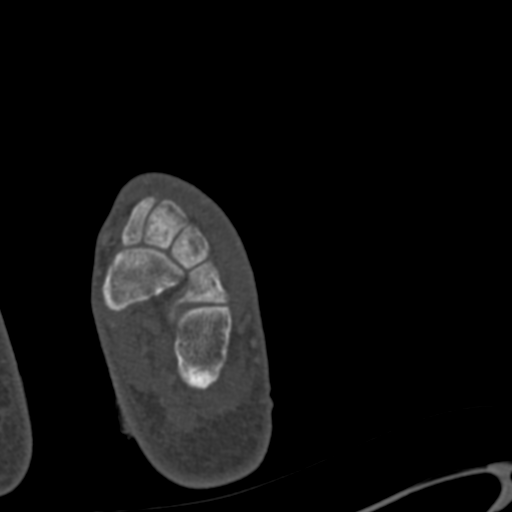
[im 273/681  bone]
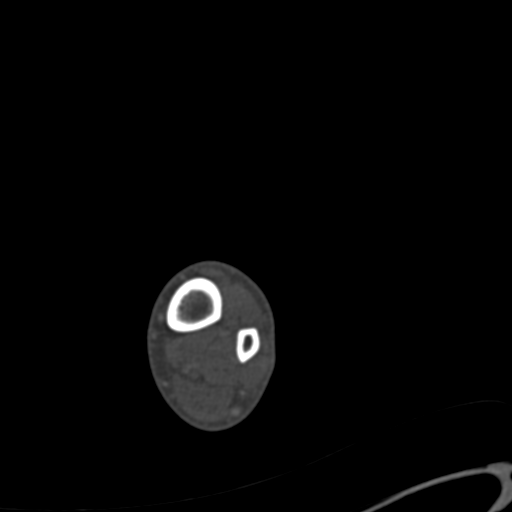
[im 409/681  bone]
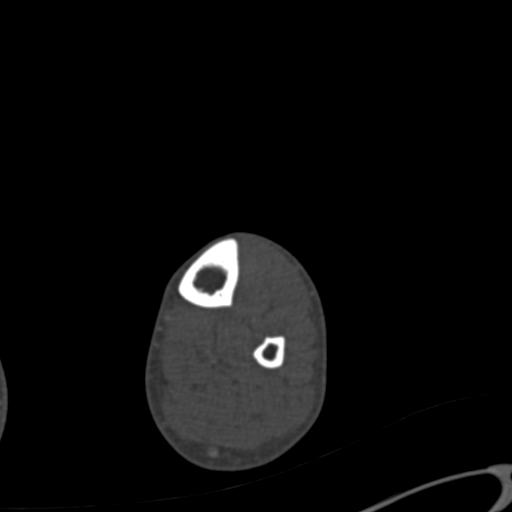
[im 545/681  bone]
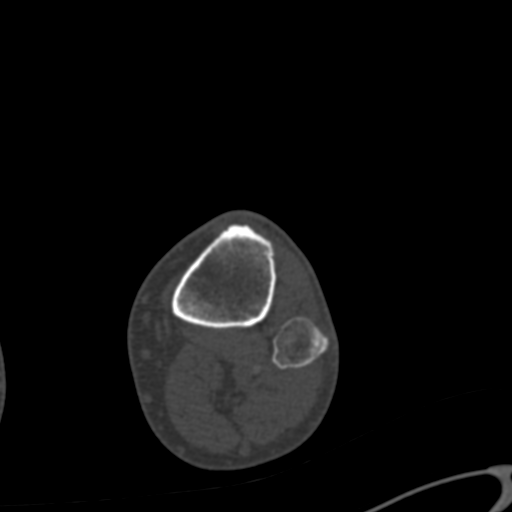

[Series 8: sagittal bone left · sagittal · 0.61mm/px · 5 of 125 slices shown]
[im 21/125  bone]
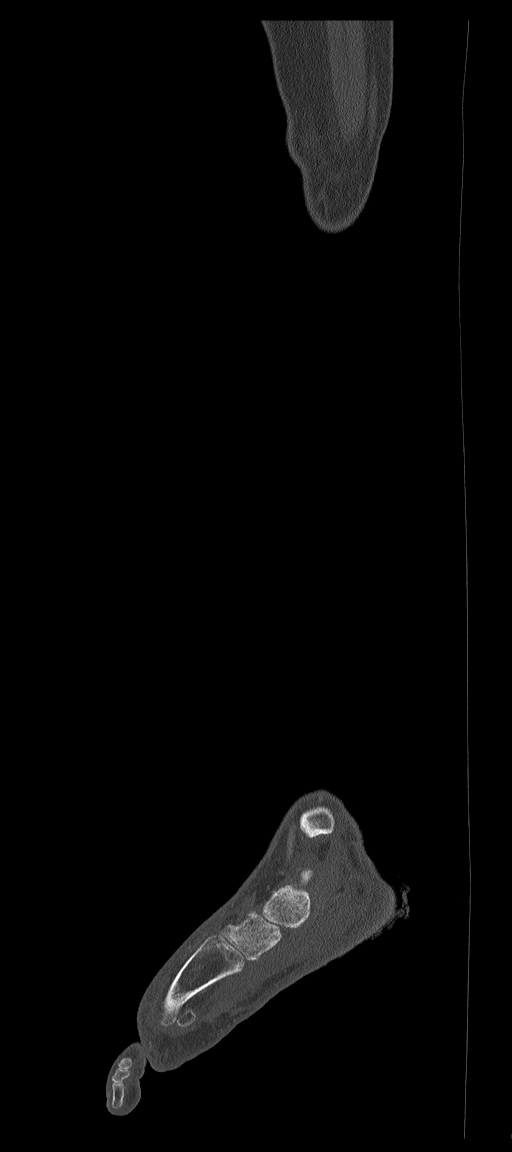
[im 42/125  bone]
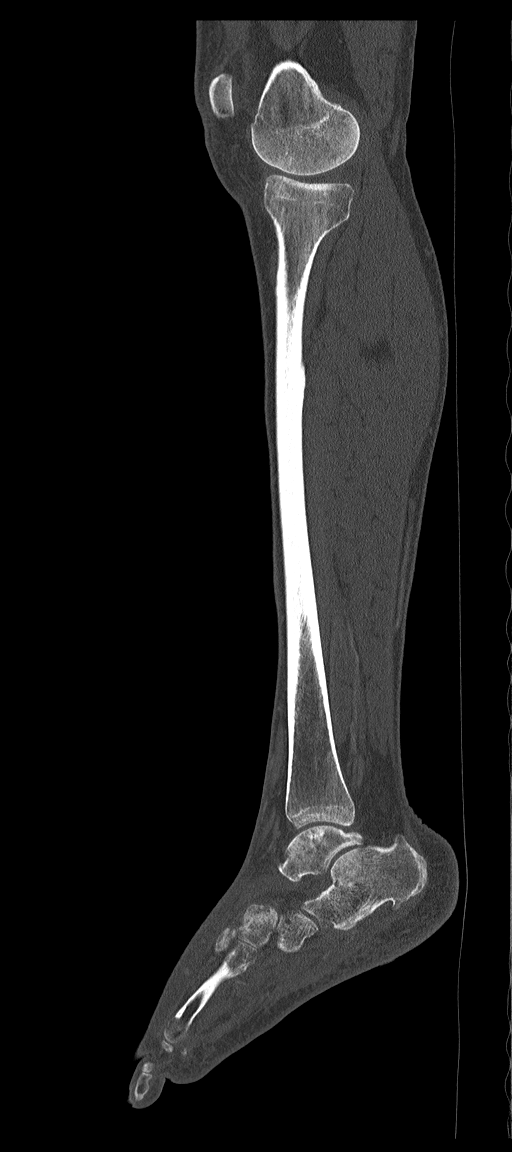
[im 63/125  bone]
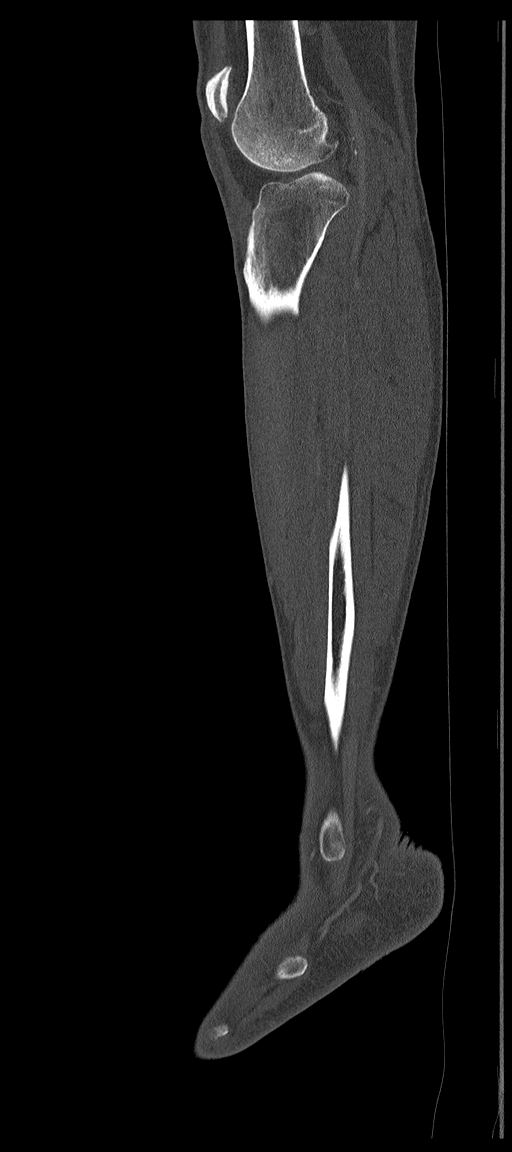
[im 83/125  bone]
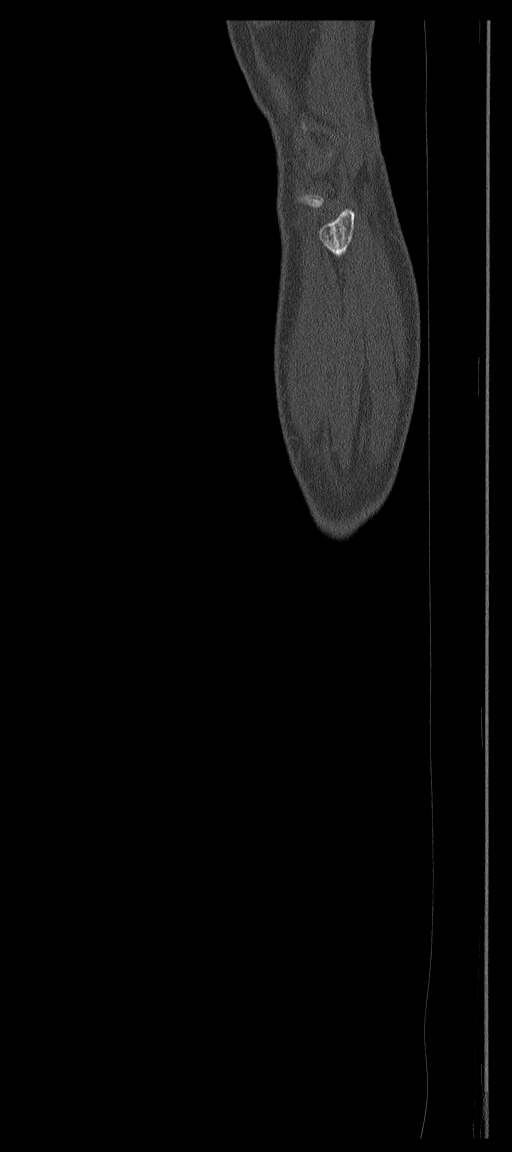
[im 104/125  bone]
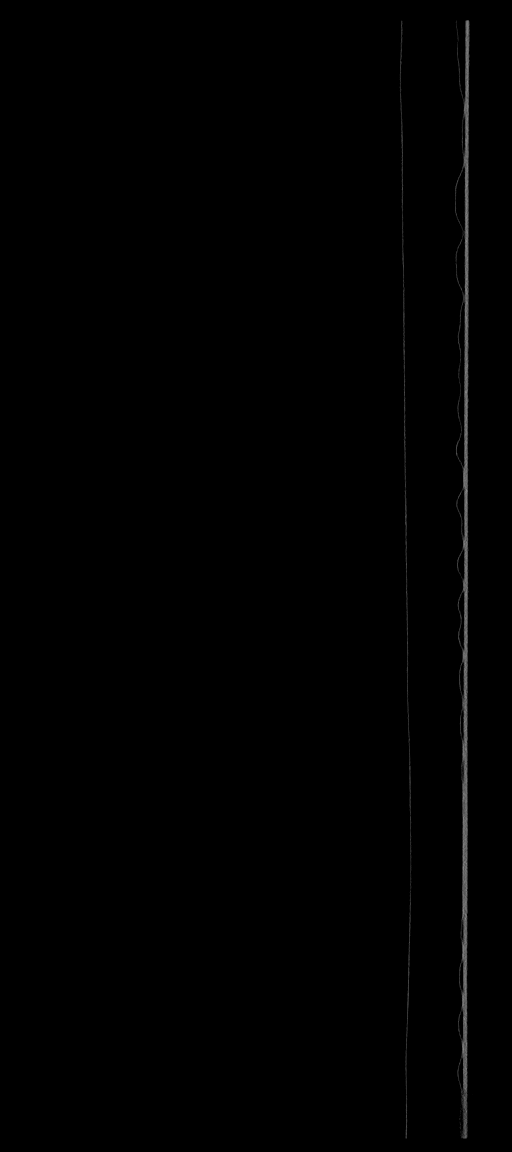

[Series 9: coronal bone left · coronal · 0.36mm/px · 1 of 208 slices shown]
[im 104/208  bone]
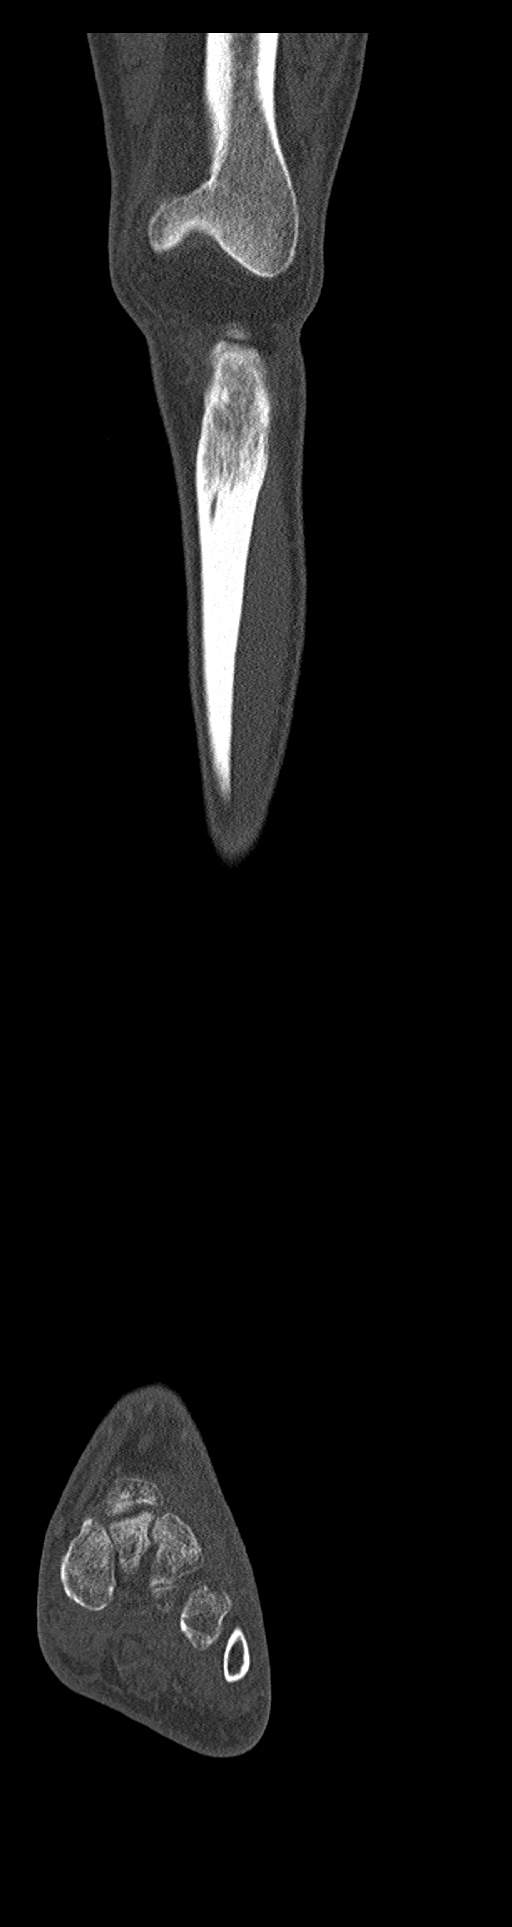

[10 of 33 positions shown; findings below may reference images not displayed]

FINDINGS: Bones/Joint/Cartilage

Prior great toe amputation. There is no frank bony destruction in
the left foot. There is mild hindfoot osteoarthritis. Dorsal midfoot
spurring. Plantar calcaneal spurring.

There is no acute osseous abnormality involving the tibia or fibula.
There is medial predominant osteoarthritis of the knee. There is
chronic fragmentation of the tibial tuberosity likely sequela of
prior Osgood-Schlatter.

Ligaments

Suboptimally assessed by CT.

Muscles and Tendons

No muscle atrophy.  No intramuscular collection.

Soft tissues

Soft tissue swelling of the foot superficially into a lesser degree
along the lower leg with skin thickening. No well-defined fluid
collection on CT. No soft tissue gas.
IMPRESSION: Skin thickening and superficial soft tissue swelling of the foot and
to a lesser degree the lower leg, as can be seen in cellulitis or
lymphedema. Prior great toe amputation with adjacent soft tissue
thickening. No evidence of soft tissue abscess or bony destruction
to suggest osteomyelitis on CT. MRI would be more sensitive.

## 2021-11-16 IMAGING — CT CT FOOT*R* W/CM
3 of 6 series · 11 of 33 positions shown, 13 images · IV contrast (omnipaque)
Comparison: Right foot radiograph [DATE]

CLINICAL DATA: Soft tissue infection suspected, lower leg, xray
done; Foot swelling, diabetic, osteomyelitis suspected, xray done

EXAM:
CT OF THE LOWER RIGHT EXTREMITY WITH CONTRAST
CT OF THE RIGHT FOOT WITH CONTRAST
TECHNIQUE: Multidetector CT imaging of the lower right extremity and right foot
was performed according to the standard protocol following
intravenous contrast administration.
CONTRAST:  100mL OMNIPAQUE IOHEXOL 350 MG/ML SOLN

[Series 6: axial bone · axial · 0.46mm/px · z∈[+406,+860]mm · 5 of 454 slices shown, 7 images]
[im 76/454  soft-tissue]
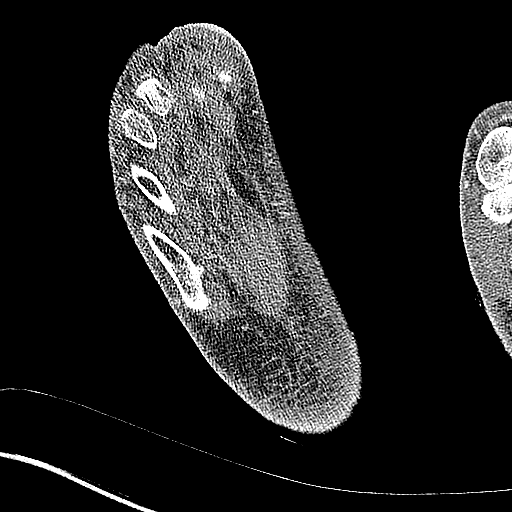
[im 76/454  bone]
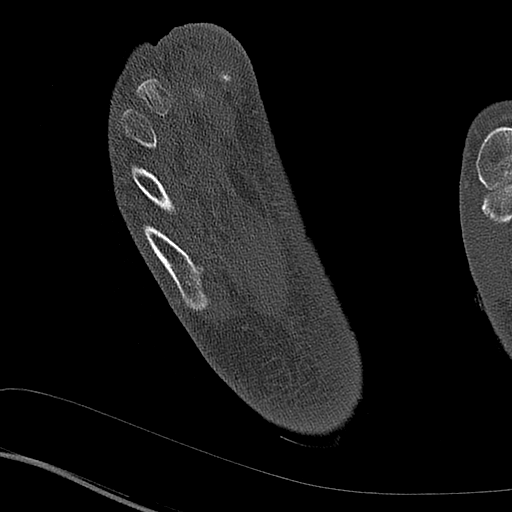
[im 152/454  bone]
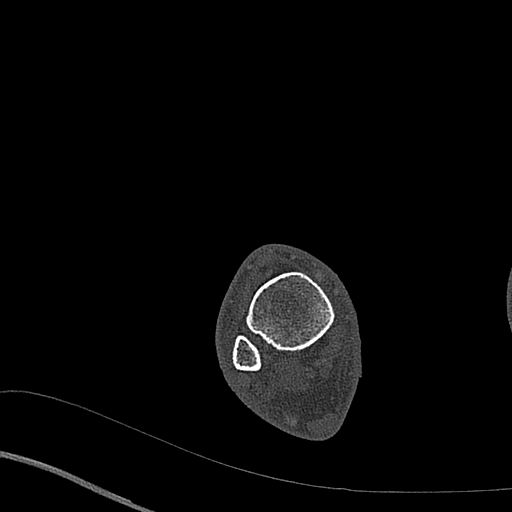
[im 227/454  bone]
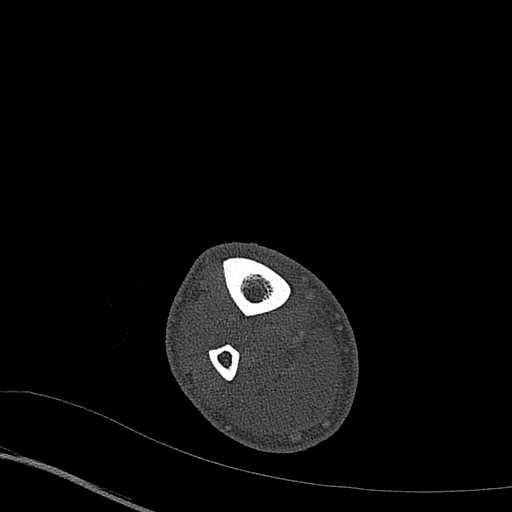
[im 303/454  bone]
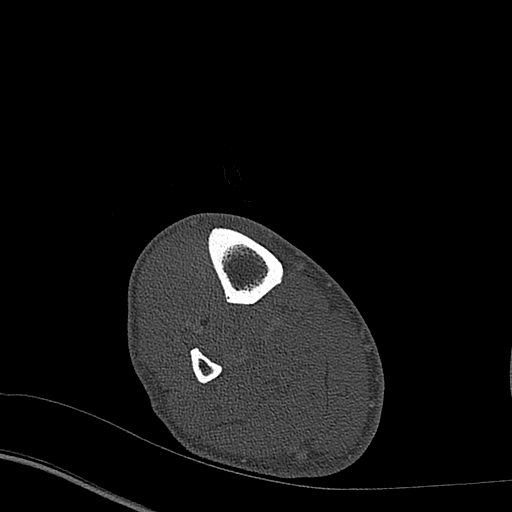
[im 378/454  soft-tissue]
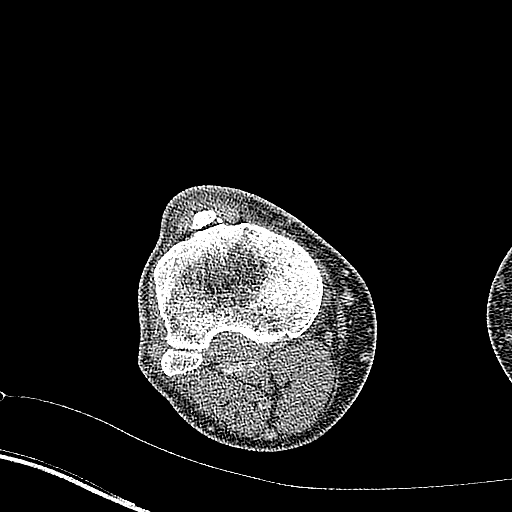
[im 378/454  bone]
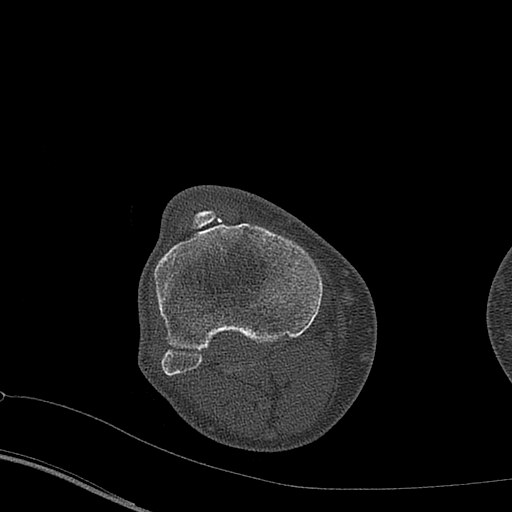

[Series 8: coronal bone · coronal · 0.50mm/px · 1 of 254 slices shown]
[im 127/254  bone]
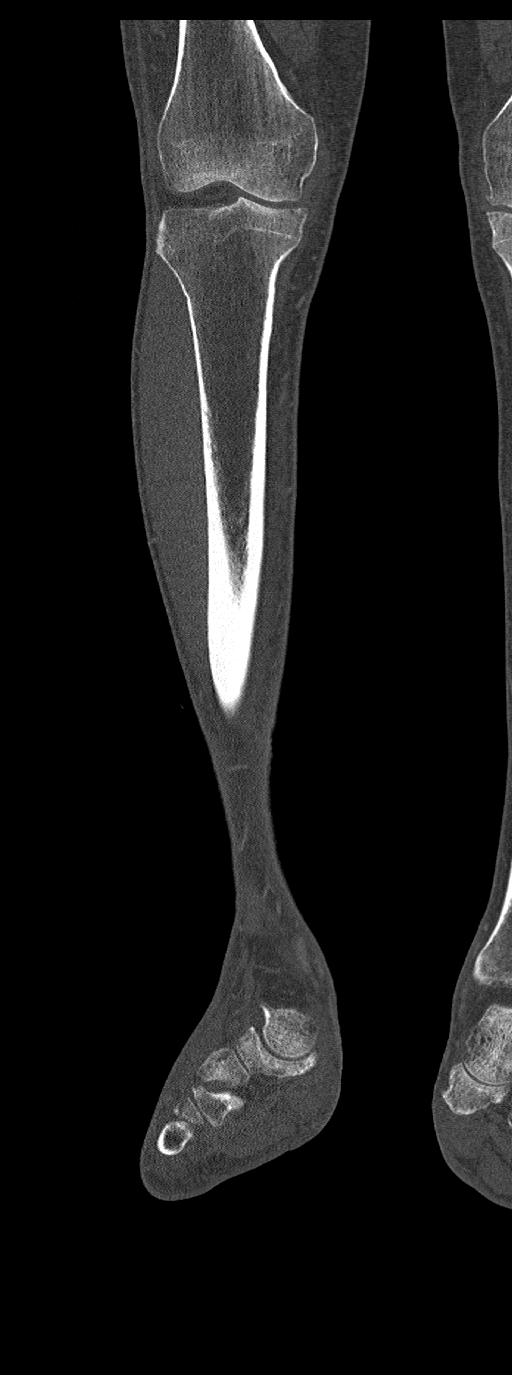

[Series 11: sagittal st · sagittal · 0.74mm/px · 5 of 142 slices shown]
[im 24/142  bone]
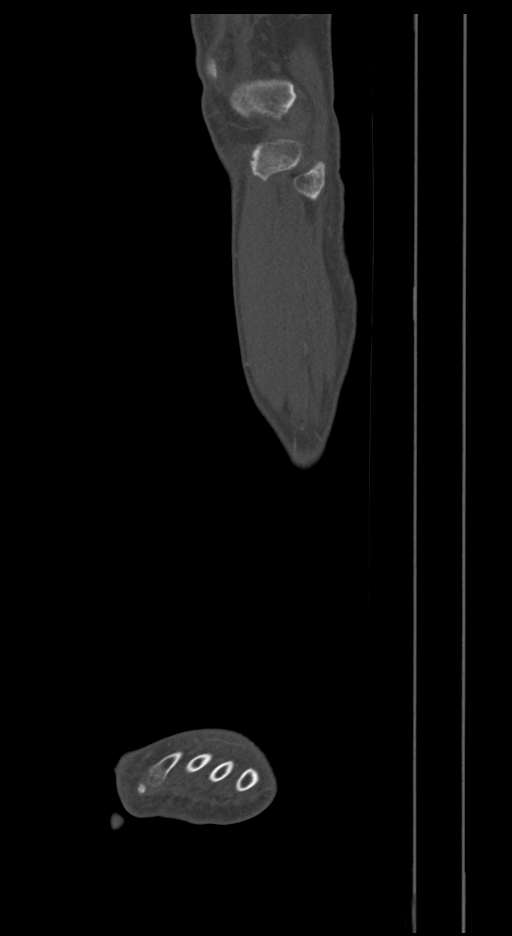
[im 48/142  bone]
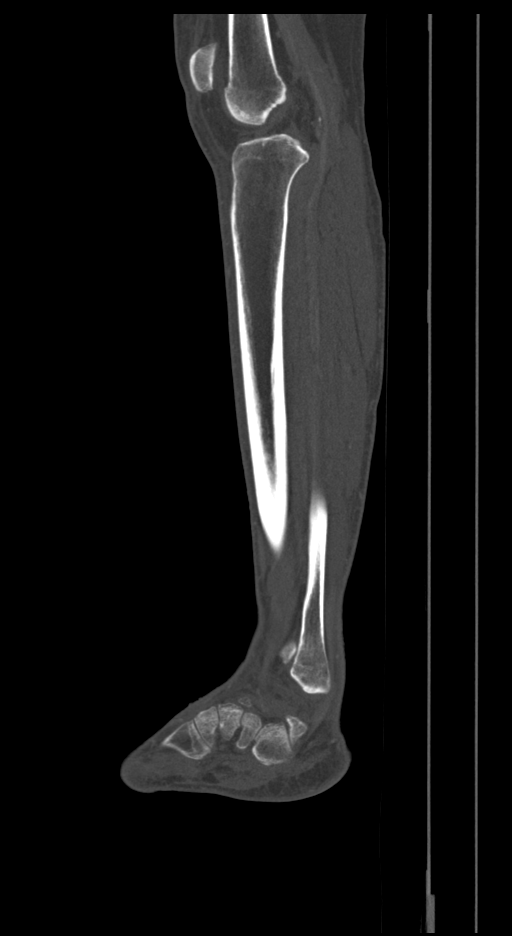
[im 71/142  bone]
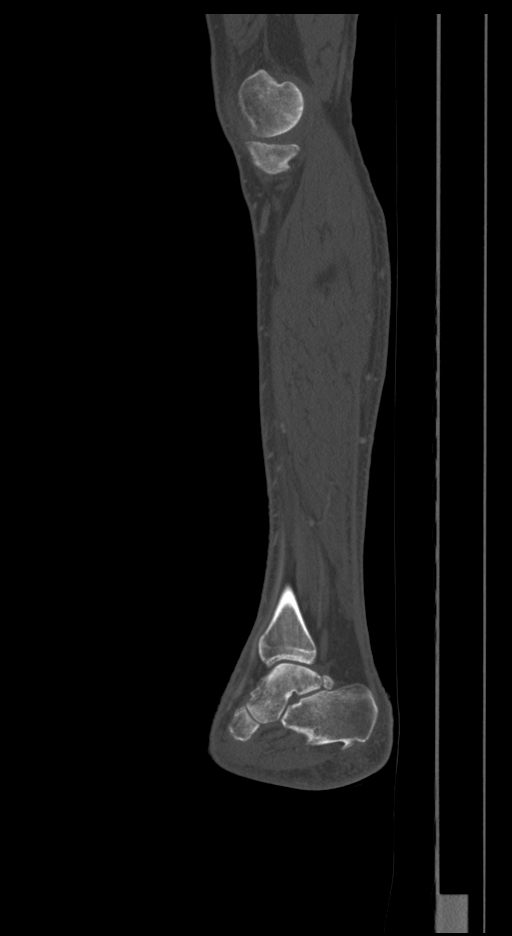
[im 95/142  bone]
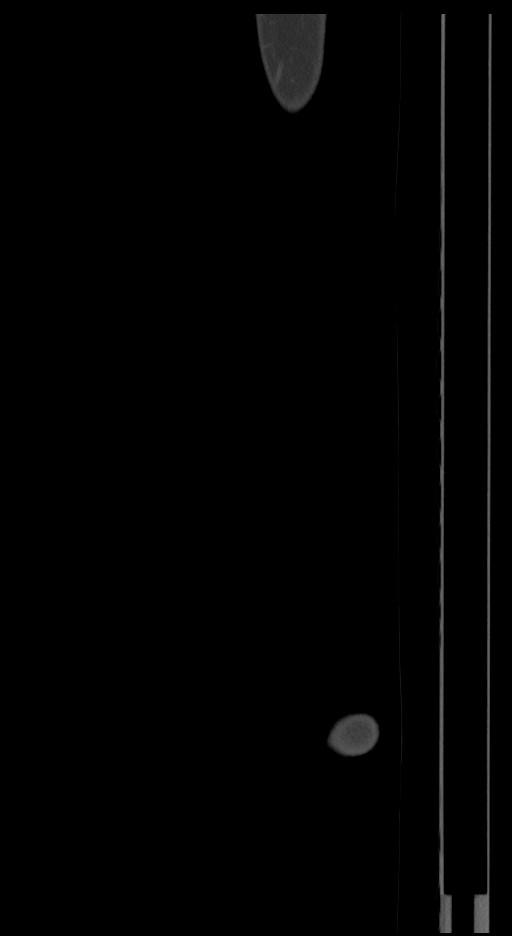
[im 118/142  bone]
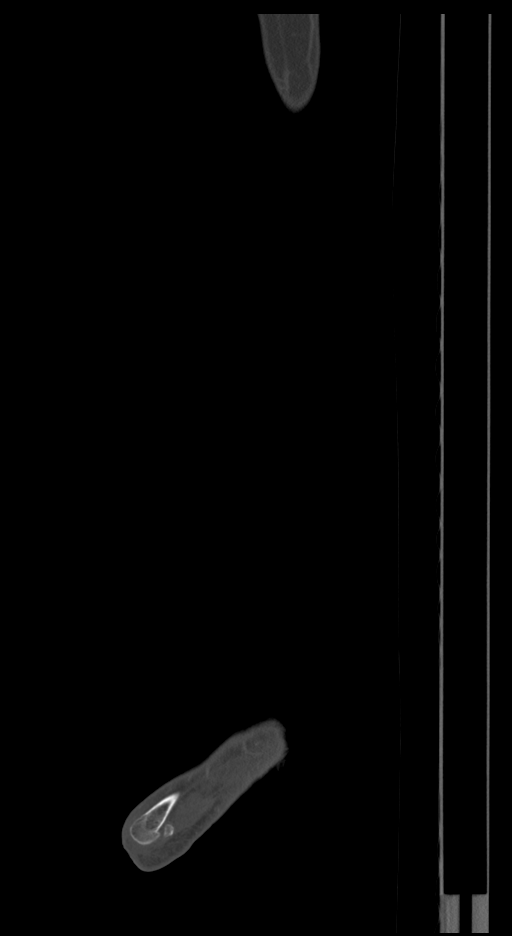

[11 of 33 positions shown; findings below may reference images not displayed]

FINDINGS: Bones/Joint/Cartilage

Prior great toe amputation. No frank bony destruction in the foot.
Mild hindfoot osteoarthritis. Os trigonum. Plantar calcaneal spur.
Dorsal midfoot spurring.

There is no acute osseous abnormality involving the tibia or fibula.
There is chronic tibial tuberosity fragmentation likely sequela of
prior Osgood-Schlatter. There is no frank bony destruction to
suggest osteomyelitis on CT.

There is mild medial patellofemoral predominant osteoarthritis of
the knee.

Ligaments

Suboptimally assessed by CT.

Muscles and Tendons

No muscle atrophy.  No intramuscular collection.

Soft tissues

There is skin thickening and soft tissue swelling of the right foot
and to a lesser degree of the lower leg. There is no rim enhancing
fluid collection. No soft tissue gas.
IMPRESSION: Skin thickening and soft tissue swelling of the right foot and to a
lesser degree of the right lower leg, as can be seen in cellulitis
or lymphedema. Prior great toe amputation with adjacent soft tissue
thickening. No evidence of soft tissue abscess or bony destruction
to suggest osteomyelitis on CT. MRI would be more sensitive.

## 2021-11-16 IMAGING — CT CT FOOT*L* W/CM
3 of 7 series · 10 of 33 positions shown, 11 images · IV contrast (omnipaque)
Comparison: Left foot radiograph [DATE]

CLINICAL DATA: Insert his

EXAM:
CT OF THE LOWER LEFT EXTREMITY WITH CONTRAST
CT OF THE LOWER LEFT FOOT WITH CONTRAST
TECHNIQUE: Multidetector CT imaging of the lower left extremity was performed
according to the standard protocol following intravenous contrast
administration.
CONTRAST:  100mL OMNIPAQUE IOHEXOL 350 MG/ML SOLN

[Series 1: thins st left · axial · 0.48mm/px · z∈[+430,+838]mm · 4 of 681 slices shown, 5 images]
[im 137/681  soft-tissue]
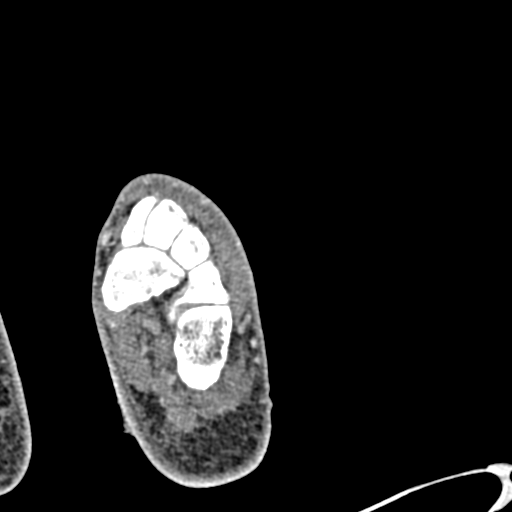
[im 137/681  bone]
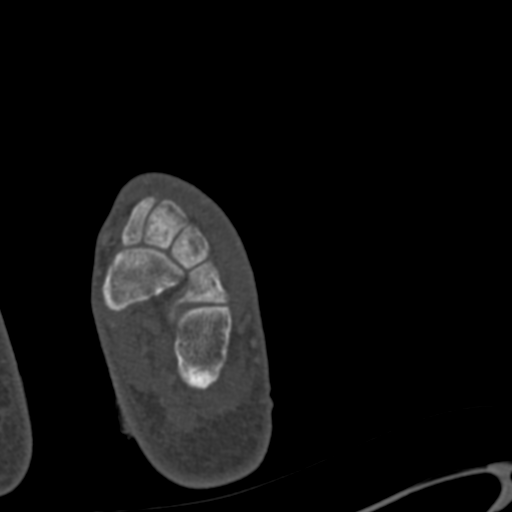
[im 273/681  bone]
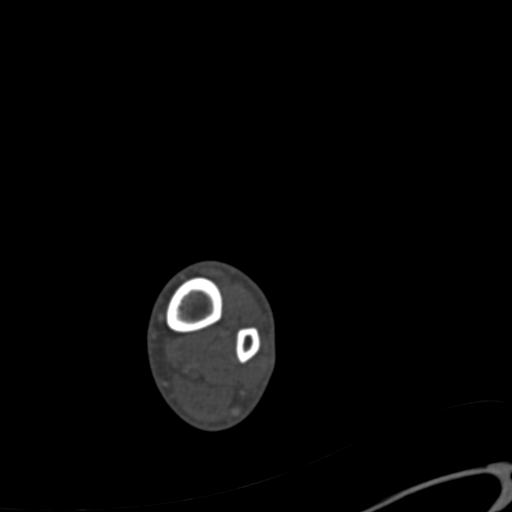
[im 409/681  bone]
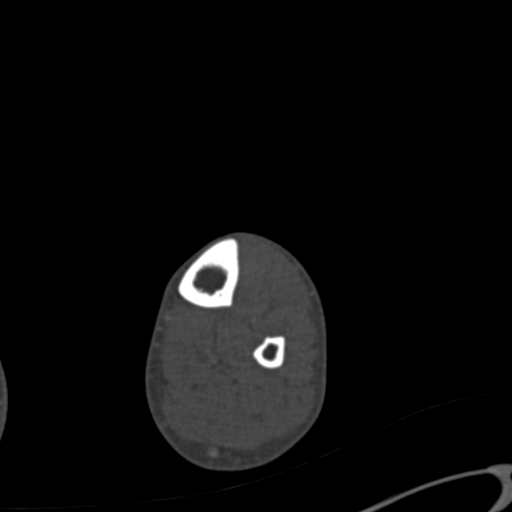
[im 545/681  bone]
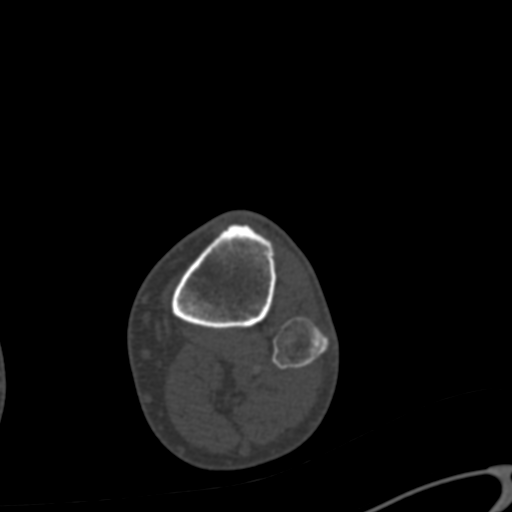

[Series 8: sagittal bone left · sagittal · 0.61mm/px · 5 of 125 slices shown]
[im 21/125  bone]
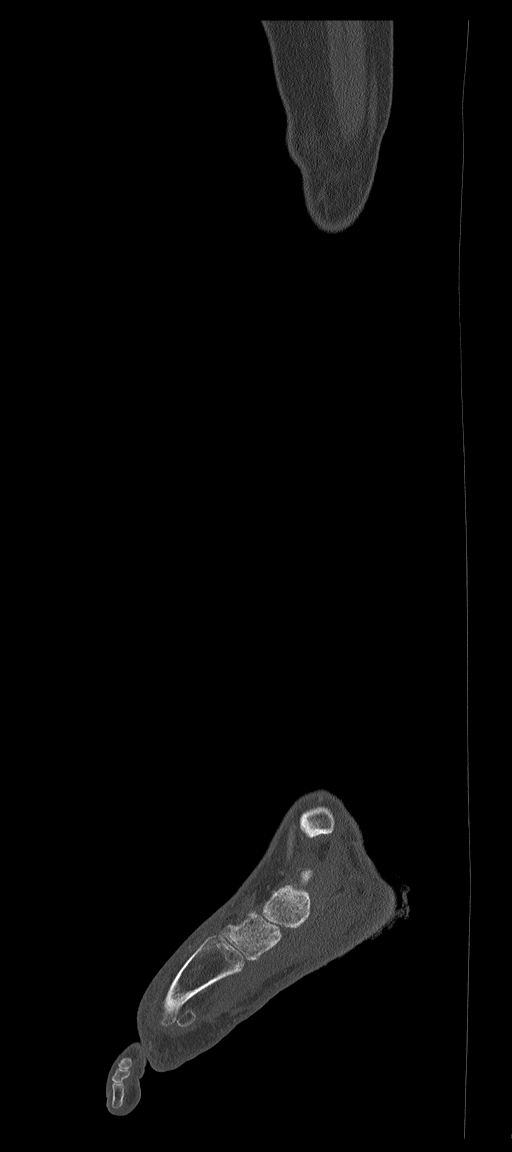
[im 42/125  bone]
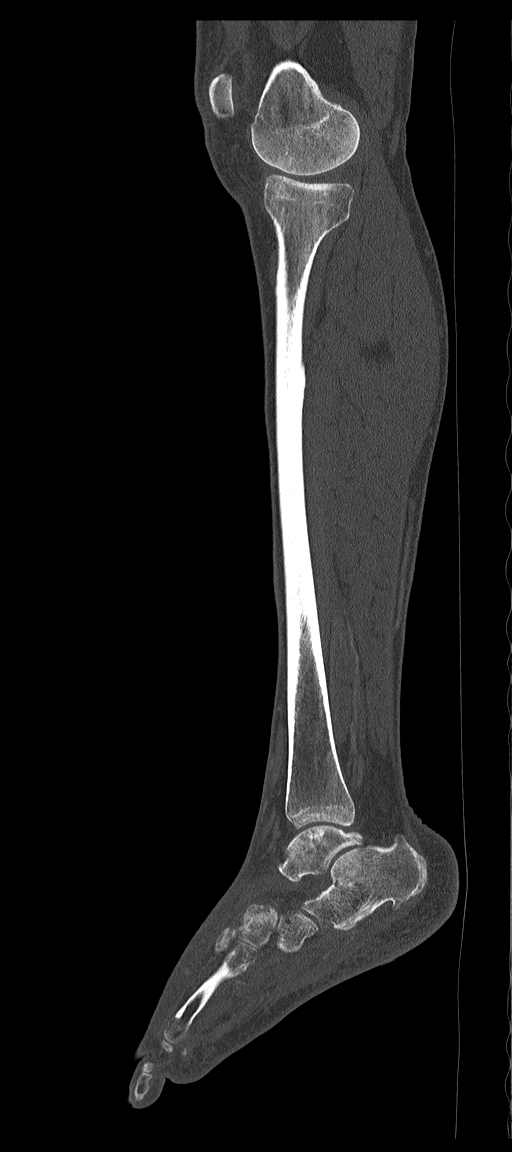
[im 63/125  bone]
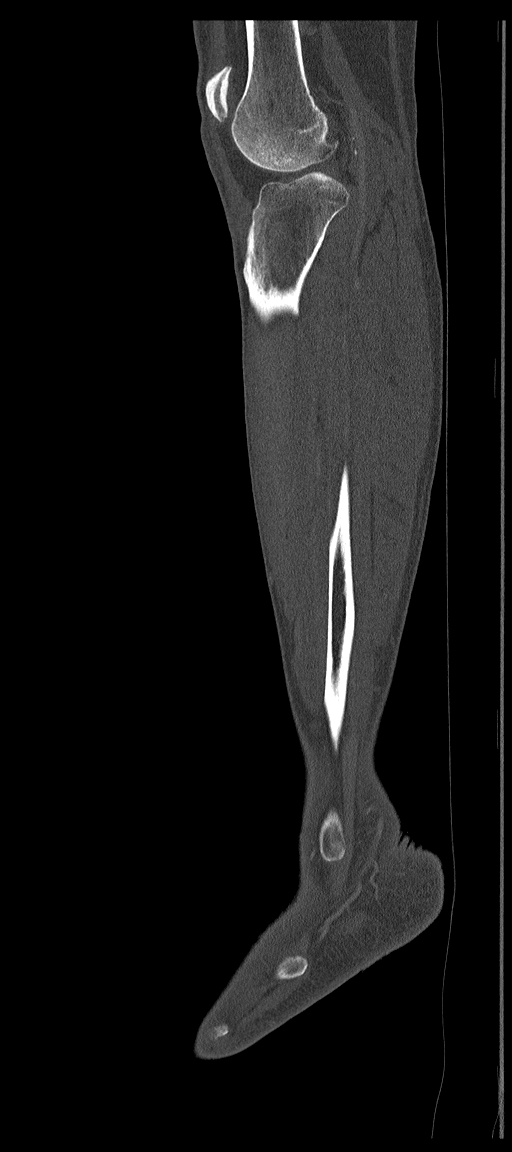
[im 83/125  bone]
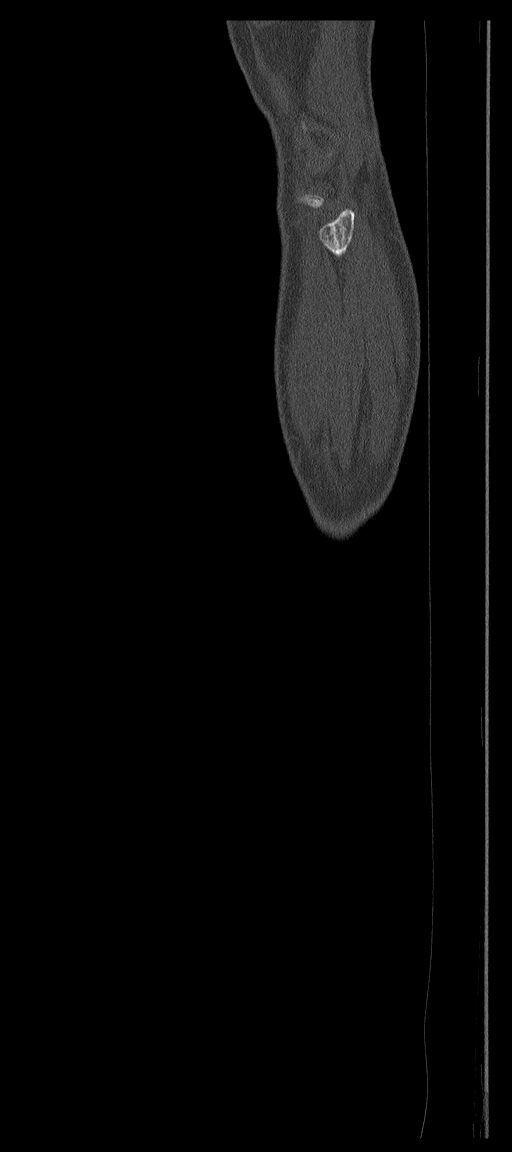
[im 104/125  bone]
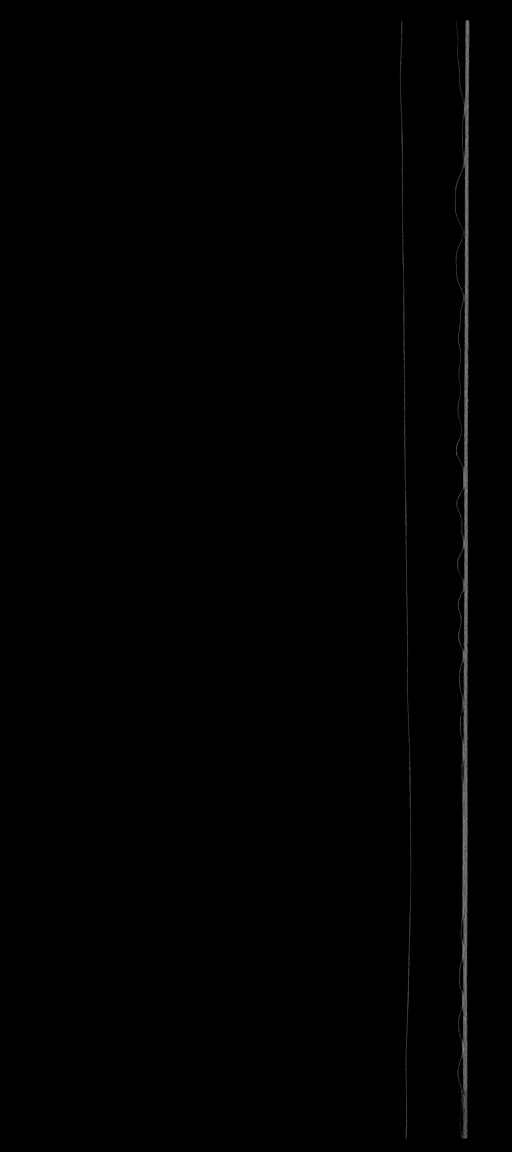

[Series 9: coronal bone left · coronal · 0.36mm/px · 1 of 208 slices shown]
[im 104/208  bone]
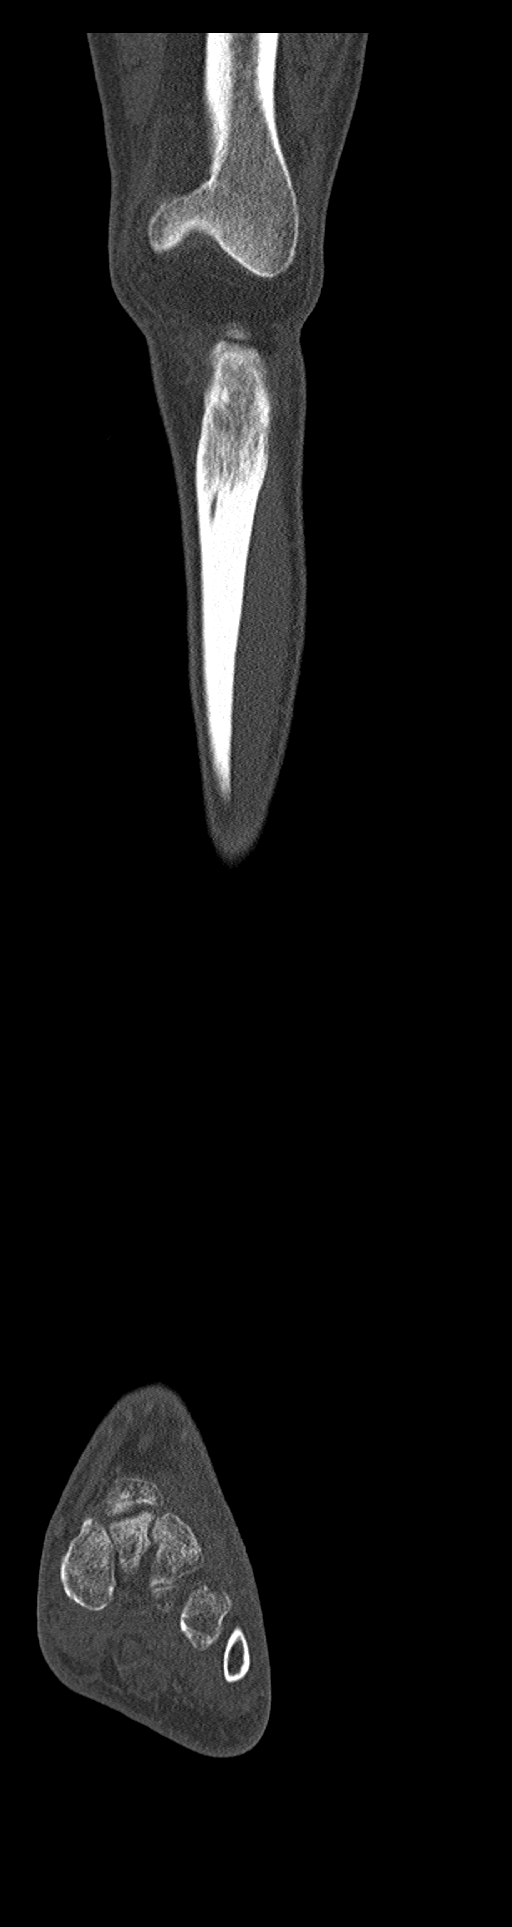

[10 of 33 positions shown; findings below may reference images not displayed]

FINDINGS: Bones/Joint/Cartilage

Prior great toe amputation. There is no frank bony destruction in
the left foot. There is mild hindfoot osteoarthritis. Dorsal midfoot
spurring. Plantar calcaneal spurring.

There is no acute osseous abnormality involving the tibia or fibula.
There is medial predominant osteoarthritis of the knee. There is
chronic fragmentation of the tibial tuberosity likely sequela of
prior Osgood-Schlatter.

Ligaments

Suboptimally assessed by CT.

Muscles and Tendons

No muscle atrophy.  No intramuscular collection.

Soft tissues

Soft tissue swelling of the foot superficially into a lesser degree
along the lower leg with skin thickening. No well-defined fluid
collection on CT. No soft tissue gas.
IMPRESSION: Skin thickening and superficial soft tissue swelling of the foot and
to a lesser degree the lower leg, as can be seen in cellulitis or
lymphedema. Prior great toe amputation with adjacent soft tissue
thickening. No evidence of soft tissue abscess or bony destruction
to suggest osteomyelitis on CT. MRI would be more sensitive.

## 2021-11-16 MED ORDER — CARBAMIDE PEROXIDE 6.5 % OT SOLN
5.0000 [drp] | Freq: Two times a day (BID) | OTIC | Status: DC
  Administered 2021-11-17 – 2021-11-20 (×6): 5 [drp] via OTIC
  Filled 2021-11-16: qty 15

## 2021-11-16 MED ORDER — VANCOMYCIN HCL 1250 MG/250ML IV SOLN
1250.0000 mg | Freq: Two times a day (BID) | INTRAVENOUS | Status: DC
  Administered 2021-11-17 – 2021-11-19 (×5): 1250 mg via INTRAVENOUS
  Filled 2021-11-16 (×5): qty 250

## 2021-11-16 MED ORDER — IOHEXOL 350 MG/ML SOLN
100.0000 mL | Freq: Once | INTRAVENOUS | Status: AC | PRN
  Administered 2021-11-16: 100 mL via INTRAVENOUS

## 2021-11-16 MED ORDER — HYDROMORPHONE HCL 1 MG/ML IJ SOLN
0.5000 mg | INTRAMUSCULAR | Status: DC | PRN
  Administered 2021-11-16 – 2021-11-20 (×12): 0.5 mg via INTRAVENOUS
  Filled 2021-11-16 (×13): qty 0.5

## 2021-11-16 MED ORDER — SODIUM CHLORIDE 0.9 % IV SOLN
2.0000 g | Freq: Three times a day (TID) | INTRAVENOUS | Status: DC
  Administered 2021-11-16 – 2021-11-19 (×8): 2 g via INTRAVENOUS
  Filled 2021-11-16 (×9): qty 2

## 2021-11-16 MED ORDER — SODIUM CHLORIDE 0.9 % IV SOLN
2.0000 g | Freq: Once | INTRAVENOUS | Status: AC
  Administered 2021-11-16: 2 g via INTRAVENOUS
  Filled 2021-11-16: qty 2

## 2021-11-16 MED ORDER — VANCOMYCIN HCL 2000 MG/400ML IV SOLN
2000.0000 mg | Freq: Once | INTRAVENOUS | Status: AC
  Administered 2021-11-16: 2000 mg via INTRAVENOUS
  Filled 2021-11-16: qty 400

## 2021-11-16 NOTE — Progress Notes (Signed)
PROGRESS NOTE    [name]  [id number] DOB: [birth year] DOA: 11/13/2021 PCP: Pcp, No    Brief Narrative:   55 y.o. male with medical history significant of asthma, bipolar 1 disorder, depression, chronic combined systolic and diastolic CHF, COPD, GERD, homelessness, hypertension, QT prolongation, schizoaffective disorder who is coming to the emergency department due to worsening of right lower extremity cellulitis.  No new trauma to the area.  He denies fever, chills, night sweats feels malaised.  He does not know how his blood glucose have been doing.  He is not currently on diabetes medications.  No rhinorrhea, sore throat, dyspnea, productive cough, wheezing or hemoptysis.  Denied chest pain, palpitations, diaphoresis, PND, orthopnea or pitting edema of the lower extremities.  No dysuria, frequency or hematuria.  No polyuria, polydipsia, polyphagia or blurred vision.  Assessment & Plan:   Principal Problem:   Cellulitis of right lower extremity Active Problems:   Hypertension   CHF exacerbation (HCC)   CAD (coronary artery disease)   Bipolar I disorder (HCC)   Homelessness   HLD (hyperlipidemia)   Type 2 diabetes mellitus (HCC)  Principal Problem:   Cellulitis of right lower extremity ruled in Tmax of 100.2 noted over past 24hrs. No leukocytosis Marked LE edema bilaterally has improved with lasix, however skin now feels warm Pt reporting increased pain Was continued on cefazolin 1 g every 8 hours, given increased pain, have broadened coverage to vanc with cefepime Analgesics as needed. Pain still poorly controlled. Have added IV dilaudid PRN Have ordered B foot xrays reviewed. Findings of soft tissue swelling Given increased pain, have ordered BLE CT   Active Problems:   Type 2 diabetes Mellitus Carbohydrate modified diet. Check hemoglobin A1c. CBG monitoring with RI SS although pt has been refusing finger sticks Cont to recommend follow-up with PCP as an  outpatient.     Hypertension On carvedilol 3.125 mg p.o. twice daily. All also on Entresto twice daily. BP stable albeit suboptimally controlled    Acute on Chronic combined systolic and diastolic HF Continue carvedilol as above. Continue Entresto twice daily. Most recent EF of <20% with grade 2 diastolic dysfunction Presented with marked BLE edema with blistering Edema has improved with IV lasix     CAD (coronary artery disease) Allergic to aspirin. Cannot for clopidogrel. Still smoking and not taking statin. Continue carvedilol 3.125 mg p.o. twice daily. Pt with recurrent tox screens pos for cocaine. Will absolutely need cessation     Bipolar I disorder (HCC) Continue Zyprexa 5 mg p.o. bedtime.     Homelessness TOC consulted     HLD (hyperlipidemia) No longer on statin. Follow-up with primary care provider when discharged.   DVT prophylaxis: Lovenox subq Code Status: Full Family Communication: Pt in room, family not at bedside  Status is: Inpatient  Requires continued inpatient status because: Severity of illness    Consultants:    Procedures:    Antimicrobials: Anti-infectives (From admission, onward)    Start     Dose/Rate Route Frequency Ordered Stop   11/17/21 0400  vancomycin (VANCOREADY) IVPB 1250 mg/250 mL        1,250 mg 166.7 mL/hr over 90 Minutes Intravenous Every 12 hours 11/16/21 1422 11/23/21 1559   11/16/21 2200  ceFEPIme (MAXIPIME) 2 g in sodium chloride 0.9 % 100 mL IVPB        2 g 200 mL/hr over 30 Minutes Intravenous Every 8 hours 11/16/21 1413 11/23/21 1359   11/16/21 1430  vancomycin (VANCOREADY) IVPB 2000  mg/400 mL        2,000 mg 200 mL/hr over 120 Minutes Intravenous  Once 11/16/21 1341     11/16/21 1430  ceFEPIme (MAXIPIME) 2 g in sodium chloride 0.9 % 100 mL IVPB        2 g 200 mL/hr over 30 Minutes Intravenous  Once 11/16/21 1341     11/15/21 2000  ceFAZolin (ANCEF) IVPB 1 g/50 mL premix  Status:  Discontinued        1 g 100  mL/hr over 30 Minutes Intravenous Every 8 hours 11/15/21 1851 11/16/21 1341   11/13/21 1500  ceFAZolin (ANCEF) IVPB 1 g/50 mL premix  Status:  Discontinued        1 g 100 mL/hr over 30 Minutes Intravenous Every 8 hours 11/13/21 1458 11/14/21 1507   11/13/21 1415  vancomycin (VANCOREADY) IVPB 1500 mg/300 mL  Status:  Discontinued        1,500 mg 150 mL/hr over 120 Minutes Intravenous  Once 11/13/21 1412 11/13/21 1413       Subjective: Complaining of worse LE pain  Objective: Vitals:   11/14/21 2208 11/15/21 0508 11/15/21 2131 11/16/21 0533  BP: (!) 145/73 138/82 (!) 154/89 (!) 167/95  Pulse: 96 73 84 70  Resp: 18 18 18 18   Temp: 100.2 F (37.9 C) 98.3 F (36.8 C) 98.3 F (36.8 C) 98.2 F (36.8 C)  TempSrc: Oral Oral Oral Oral  SpO2: 99% 96% 95% 99%  Weight:      Height:        Intake/Output Summary (Last 24 hours) at 11/16/2021 1510 Last data filed at 11/16/2021 1300 Gross per 24 hour  Intake 840 ml  Output 4425 ml  Net -3585 ml    Filed Weights   11/13/21 1700  Weight: 90.5 kg    Examination: General exam: Awake, laying in bed, in nad Respiratory system: Normal respiratory effort, no wheezing Cardiovascular system: regular rate, s1, s2 Gastrointestinal system: Soft, nondistended, positive BS Central nervous system: CN2-12 grossly intact, strength intact Extremities: BLE edematous, warm, tender to palpation Skin: Normal skin turgor, no notable skin lesions seen Psychiatry: Mood normal // no visual hallucinations   Data Reviewed: I have personally reviewed following labs and imaging studies  CBC: Recent Labs  Lab 11/13/21 1301 11/15/21 0436 11/16/21 0457  WBC 8.7 9.2 6.8  NEUTROABS 5.5  --   --   HGB 14.9 12.1* 12.7*  HCT 44.1 35.6* 37.6*  MCV 95.9 95.4 94.9  PLT 283 254 299    Basic Metabolic Panel: Recent Labs  Lab 11/13/21 1301 11/15/21 0436 11/16/21 0457  NA 134* 137 136  K 3.5 3.6 3.7  CL 96* 100 100  CO2 28 25 26   GLUCOSE 123* 105*  132*  BUN 12 13 17   CREATININE 1.01 1.01 1.06  CALCIUM 9.1 8.5* 8.7*    GFR: Estimated Creatinine Clearance: 89.6 mL/min (by C-G formula based on SCr of 1.06 mg/dL). Liver Function Tests: Recent Labs  Lab 11/13/21 1301 11/15/21 0436 11/16/21 0457  AST 22 14* 16  ALT 17 12 13   ALKPHOS 78 50 50  BILITOT 1.1 0.5 0.5  PROT 8.4* 6.6 7.2  ALBUMIN 3.8 2.9* 3.0*    No results for input(s): LIPASE, AMYLASE in the last 168 hours. No results for input(s): AMMONIA in the last 168 hours. Coagulation Profile: Recent Labs  Lab 11/13/21 1301  INR 1.1    Cardiac Enzymes: No results for input(s): CKTOTAL, CKMB, CKMBINDEX, TROPONINI in the last 168 hours.  BNP (last 3 results) No results for input(s): PROBNP in the last 8760 hours. HbA1C: No results for input(s): HGBA1C in the last 72 hours.  CBG: Recent Labs  Lab 11/14/21 0748 11/14/21 1219 11/14/21 1631 11/15/21 2138 11/16/21 0808  GLUCAP 102* 94 187* 130* 185*    Lipid Profile: No results for input(s): CHOL, HDL, LDLCALC, TRIG, CHOLHDL, LDLDIRECT in the last 72 hours. Thyroid Function Tests: No results for input(s): TSH, T4TOTAL, FREET4, T3FREE, THYROIDAB in the last 72 hours. Anemia Panel: No results for input(s): VITAMINB12, FOLATE, FERRITIN, TIBC, IRON, RETICCTPCT in the last 72 hours. Sepsis Labs: Recent Labs  Lab 11/13/21 1301 11/13/21 1501  LATICACIDVEN 1.5 1.5     Recent Results (from the past 240 hour(s))  Culture, blood (Routine x 2)     Status: None (Preliminary result)   Collection Time: 11/13/21  1:01 PM   Specimen: BLOOD  Result Value Ref Range Status   Specimen Description   Final    BLOOD BLOOD LEFT HAND Performed at [facility], [place] [address], [place], [place] [address]    Special Requests   Final    BOTTLES DRAWN AEROBIC AND ANAEROBIC Blood Culture results may not be optimal due to an excessive volume of blood received in culture bottles Performed at [facility] [address] [place], [place] [address]    Culture   Final    NO GROWTH 2 DAYS Performed at [facility] [address] [place], [place] [address]    Report Status PENDING  Incomplete  Culture, blood (Routine x 2)     Status: None (Preliminary result)   Collection Time: 11/13/21  1:06 PM   Specimen: BLOOD  Result Value Ref Range Status   Specimen Description   Final    BLOOD LEFT ANTECUBITAL Performed at [facility] [address], [place], [place] [address]    Special Requests   Final    BOTTLES DRAWN AEROBIC AND ANAEROBIC Blood Culture adequate volume Performed at [facility] [address] [place], [place] [address]    Culture   Final    NO GROWTH 2 DAYS Performed at [facility] [address], [place], [place] [address]    Report Status PENDING  Incomplete      Radiology Studies: DG Foot Complete Left  Result Date: 11/15/2021 CLINICAL DATA:  Diabetic foot.  Cellulitis EXAM: LEFT FOOT - COMPLETE 3+ VIEW COMPARISON:  02/19/2021 FINDINGS: AP taken of the great toe at the level of the first MTP. No change in appearance from the prior study. No fracture or osteomyelitis. Remaining toes appear negative. Soft tissue swelling of the midfoot. IMPRESSION: Negative for fracture or osteomyelitis. Amputation of the great toe. Soft tissue swelling of the midfoot. Electronically Signed   By: Franchot Gallo M.D.   On: 11/15/2021 15:05   DG Foot Complete Right  Result Date: 11/15/2021 CLINICAL DATA:  Diabetic foot.  Cellulitis EXAM: RIGHT FOOT COMPLETE - 3+ VIEW COMPARISON:  02/19/2021 FINDINGS: Amputation of the great toe at the level the first MTP. Negative for fracture or osteomyelitis. Remaining toes are negative. IMPRESSION: Negative for fracture or osteomyelitis.  Amputation great toe. Electronically Signed   By: Franchot Gallo M.D.   On: 11/15/2021 15:06    Scheduled Meds:  carvedilol  3.125 mg Oral BID    enoxaparin (LOVENOX) injection  40 mg Subcutaneous Q24H   feeding supplement  237 mL Oral BID BM  furosemide  40 mg Intravenous BID   hydrocerin   Topical Daily   insulin aspart  0-15 Units Subcutaneous TID WC   multivitamin with minerals  1 tablet Oral Daily   nutrition supplement (JUVEN)  1 packet Oral BID BM   OLANZapine  5 mg Oral QHS   sacubitril-valsartan  1 tablet Oral BID   spironolactone  12.5 mg Oral Daily   Continuous Infusions:  ceFEPime (MAXIPIME) IV     ceFEPime (MAXIPIME) IV     [START ON 11/17/2021] vancomycin     vancomycin        LOS: 2 days   Marylu Lund, MD [facility] Pager On Amion  If 7PM-7AM, please contact night-coverage 11/16/2021, 3:10 PM

## 2021-11-16 NOTE — Progress Notes (Signed)
Pharmacy Antibiotic Note  [name] is a 55 y.o. male admitted on 11/13/2021 with cellulitis.  Pharmacy has been consulted for Vancomycin and Cefepime dosing.  Plan: Vancomycin 2g IV x 1, then 1250mg  IV q12h  Vancomycin levels at steady state, as indicated Cefepime 2g IV q8h Monitor renal function, cultures, clinical course  Height: 5\' 9"  (175.3 cm) Weight: 90.5 kg (199 lb 8.3 oz) IBW/kg (Calculated) : 70.7  Temp (24hrs), Avg:98.3 F (36.8 C), Min:98.2 F (36.8 C), Max:98.3 F (36.8 C)  Recent Labs  Lab 11/13/21 1301 11/13/21 1501 11/15/21 0436 11/16/21 0457  WBC 8.7  --  9.2 6.8  CREATININE 1.01  --  1.01 1.06  LATICACIDVEN 1.5 1.5  --   --     Estimated Creatinine Clearance: 89.6 mL/min (by C-G formula based on SCr of 1.06 mg/dL).    Allergies  Allergen Reactions   Ibuprofen Anaphylaxis   Acetaminophen Hives, Nausea And Vomiting and Rash    Pt overdosed on bottle of APAP 05/06/16    Lyrica Cr [Pregabalin Er] Other (See Comments)    Dystonia and dysarthria   Peanut-Containing Drug Products Other (See Comments)    Pt denies allergy to peanut butter 03/02/2021    Antimicrobials this admission: 12/14 Cefazolin >> 12/15, resumed 12/16 >> 12/17 12/17 Vancomycin >> 12/17 Cefepime >>  Dose adjustments this admission: --  Microbiology results: 12/17 BCx: NGTD  Thank you for allowing pharmacy to be a part of this patients care.   1/18, PharmD, BCPS Clinical Pharmacist  11/16/2021 2:16 PM

## 2021-11-17 LAB — CBC
HCT: 37.5 % — ABNORMAL LOW (ref 39.0–52.0)
Hemoglobin: 12.8 g/dL — ABNORMAL LOW (ref 13.0–17.0)
MCH: 32.2 pg (ref 26.0–34.0)
MCHC: 34.1 g/dL (ref 30.0–36.0)
MCV: 94.5 fL (ref 80.0–100.0)
Platelets: 328 10*3/uL (ref 150–400)
RBC: 3.97 MIL/uL — ABNORMAL LOW (ref 4.22–5.81)
RDW: 13.1 % (ref 11.5–15.5)
WBC: 5.4 10*3/uL (ref 4.0–10.5)
nRBC: 0 % (ref 0.0–0.2)

## 2021-11-17 LAB — COMPREHENSIVE METABOLIC PANEL
ALT: 13 U/L (ref 0–44)
AST: 14 U/L — ABNORMAL LOW (ref 15–41)
Albumin: 3 g/dL — ABNORMAL LOW (ref 3.5–5.0)
Alkaline Phosphatase: 45 U/L (ref 38–126)
Anion gap: 9 (ref 5–15)
BUN: 19 mg/dL (ref 6–20)
CO2: 24 mmol/L (ref 22–32)
Calcium: 8.6 mg/dL — ABNORMAL LOW (ref 8.9–10.3)
Chloride: 103 mmol/L (ref 98–111)
Creatinine, Ser: 1.03 mg/dL (ref 0.61–1.24)
GFR, Estimated: >60 mL/min (ref >60)
Glucose, Bld: 103 mg/dL — ABNORMAL HIGH (ref 70–99)
Potassium: 3.9 mmol/L (ref 3.5–5.1)
Sodium: 136 mmol/L (ref 135–145)
Total Bilirubin: 0.5 mg/dL (ref 0.3–1.2)
Total Protein: 7.2 g/dL (ref 6.5–8.1)

## 2021-11-17 LAB — GLUCOSE, CAPILLARY
Glucose-Capillary: 99 mg/dL (ref 70–99)
Glucose-Capillary: 148 mg/dL — ABNORMAL HIGH (ref 70–99)
Glucose-Capillary: 119 mg/dL — ABNORMAL HIGH (ref 70–99)
Glucose-Capillary: 166 mg/dL — ABNORMAL HIGH (ref 70–99)

## 2021-11-17 MED ORDER — BACITRACIN ZINC 500 UNIT/GM EX OINT
TOPICAL_OINTMENT | Freq: Two times a day (BID) | CUTANEOUS | Status: DC
  Filled 2021-11-17: qty 28.35

## 2021-11-17 NOTE — Progress Notes (Signed)
PROGRESS NOTE    [name]  KDT:[id number] DOB: July 13, 1966 DOA: 11/13/2021 PCP: Pcp, No    Brief Narrative:   55 y.o. male with medical history significant of asthma, bipolar 1 disorder, depression, chronic combined systolic and diastolic CHF, COPD, GERD, homelessness, hypertension, QT prolongation, schizoaffective disorder who is coming to the emergency department due to worsening of right lower extremity cellulitis.  No new trauma to the area.  He denies fever, chills, night sweats feels malaised.  He does not know how his blood glucose have been doing.  He is not currently on diabetes medications.  No rhinorrhea, sore throat, dyspnea, productive cough, wheezing or hemoptysis.  Denied chest pain, palpitations, diaphoresis, PND, orthopnea or pitting edema of the lower extremities.  No dysuria, frequency or hematuria.  No polyuria, polydipsia, polyphagia or blurred vision.  Assessment & Plan:   Principal Problem:   Cellulitis of right lower extremity Active Problems:   Hypertension   CHF exacerbation (HCC)   CAD (coronary artery disease)   Bipolar I disorder (HCC)   Homelessness   HLD (hyperlipidemia)   Type 2 diabetes mellitus (HCC)  Principal Problem:   Cellulitis of right lower extremity ruled in Tmax of 100.2 noted over past 24hrs. No leukocytosis Marked LE edema bilaterally has improved with lasix, however skin now feels warm Pt reporting increased pain Was continued on cefazolin 1 g every 8 hours, given recent increased pain, will continue broadened coverage with vanc with cefepime Analgesics as needed. Pain still poorly controlled. Cont with IV dilaudid PRN BLE CT reviewed with patient. Findings of celluliits w/o florid osteomyelitis and no evidence of necrotizing faciitis    Active Problems:   Type 2 diabetes Mellitus Carbohydrate modified diet. Check hemoglobin A1c. CBG monitoring with RI SS although pt has been refusing finger sticks Cont to recommend follow-up  with PCP as an outpatient.     Hypertension On carvedilol 3.125 mg p.o. twice daily. All also on Entresto twice daily. BP labile. Documented to have refused repeat checks or intervention    Acute on Chronic combined systolic and diastolic HF Continue carvedilol as above. Continue Entresto twice daily. Most recent EF of <20% with grade 2 diastolic dysfunction Presented with marked BLE edema with blistering Edema has improved with IV lasix     CAD (coronary artery disease) Allergic to aspirin. Cannot for clopidogrel. Still smoking and not taking statin. Continue carvedilol 3.125 mg p.o. twice daily. Pt with recurrent tox screens pos for cocaine. Will absolutely need cessation and for pt to abstain     Bipolar I disorder (HCC) Continue Zyprexa 5 mg p.o. bedtime.     Homelessness TOC consulted     HLD (hyperlipidemia) No longer on statin. Follow-up with primary care provider when discharged.   DVT prophylaxis: Lovenox subq Code Status: Full Family Communication: Pt in room, family not at bedside  Status is: Inpatient  Requires continued inpatient status because: Severity of illness    Consultants:    Procedures:    Antimicrobials: Anti-infectives (From admission, onward)    Start     Dose/Rate Route Frequency Ordered Stop   11/17/21 0400  vancomycin (VANCOREADY) IVPB 1250 mg/250 mL        1,250 mg 166.7 mL/hr over 90 Minutes Intravenous Every 12 hours 11/16/21 1422 11/23/21 1759   11/16/21 2200  ceFEPIme (MAXIPIME) 2 g in sodium chloride 0.9 % 100 mL IVPB        2 g 200 mL/hr over 30 Minutes Intravenous Every 8 hours 11/16/21  1413 11/23/21 1359   11/16/21 1430  vancomycin (VANCOREADY) IVPB 2000 mg/400 mL        2,000 mg 200 mL/hr over 120 Minutes Intravenous  Once 11/16/21 1341 11/17/21 0840   11/16/21 1430  ceFEPIme (MAXIPIME) 2 g in sodium chloride 0.9 % 100 mL IVPB        2 g 200 mL/hr over 30 Minutes Intravenous  Once 11/16/21 1341 11/17/21 0840    11/15/21 2000  ceFAZolin (ANCEF) IVPB 1 g/50 mL premix  Status:  Discontinued        1 g 100 mL/hr over 30 Minutes Intravenous Every 8 hours 11/15/21 1851 11/16/21 1341   11/13/21 1500  ceFAZolin (ANCEF) IVPB 1 g/50 mL premix  Status:  Discontinued        1 g 100 mL/hr over 30 Minutes Intravenous Every 8 hours 11/13/21 1458 11/14/21 1507   11/13/21 1415  vancomycin (VANCOREADY) IVPB 1500 mg/300 mL  Status:  Discontinued        1,500 mg 150 mL/hr over 120 Minutes Intravenous  Once 11/13/21 1412 11/13/21 1413       Subjective: Complaining of continued LE pain  Objective: Vitals:   11/16/21 0533 11/16/21 2135 11/17/21 0504 11/17/21 1401  BP: (!) 167/95 (!) 132/114 (!) 197/130 135/90  Pulse: 70 77 74 98  Resp: 18 18 18 14   Temp: 98.2 F (36.8 C) 98.5 F (36.9 C) 97.9 F (36.6 C) 98.8 F (37.1 C)  TempSrc: Oral Oral Oral Oral  SpO2: 99% 100% 98% 99%  Weight:      Height:        Intake/Output Summary (Last 24 hours) at 11/17/2021 1539 Last data filed at 11/17/2021 1539 Gross per 24 hour  Intake 2358.06 ml  Output 3400 ml  Net -1041.94 ml    Filed Weights   11/13/21 1700  Weight: 90.5 kg    Examination: General exam: Conversant, in no acute distress Respiratory system: normal chest rise, clear, no audible wheezing Cardiovascular system: regular rhythm, s1-s2 Gastrointestinal system: Nondistended, nontender, pos BS Central nervous system: No seizures, no tremors Extremities: No cyanosis, no joint deformities Skin: BLE edema improved with continued warmth and tenderness to palpation, no active drainage Psychiatry: Affect normal // no auditory hallucinations   Data Reviewed: I have personally reviewed following labs and imaging studies  CBC: Recent Labs  Lab 11/13/21 1301 11/15/21 0436 11/16/21 0457 11/17/21 0449  WBC 8.7 9.2 6.8 5.4  NEUTROABS 5.5  --   --   --   HGB 14.9 12.1* 12.7* 12.8*  HCT 44.1 35.6* 37.6* 37.5*  MCV 95.9 95.4 94.9 94.5  PLT 283 254  299 [id number]    Basic Metabolic Panel: Recent Labs  Lab 11/13/21 1301 11/15/21 0436 11/16/21 0457 11/17/21 0449  NA 134* 137 136 136  K 3.5 3.6 3.7 3.9  CL 96* 100 100 103  CO2 28 25 26 24   GLUCOSE 123* 105* 132* 103*  BUN 12 13 17 19   CREATININE 1.01 1.01 1.06 1.03  CALCIUM 9.1 8.5* 8.7* 8.6*    GFR: Estimated Creatinine Clearance: 92.2 mL/min (by C-G formula based on SCr of 1.03 mg/dL). Liver Function Tests: Recent Labs  Lab 11/13/21 1301 11/15/21 0436 11/16/21 0457 11/17/21 0449  AST 22 14* 16 14*  ALT 17 12 13 13   ALKPHOS 78 50 50 45  BILITOT 1.1 0.5 0.5 0.5  PROT 8.4* 6.6 7.2 7.2  ALBUMIN 3.8 2.9* 3.0* 3.0*    No results for input(s): LIPASE, AMYLASE in  the last 168 hours. No results for input(s): AMMONIA in the last 168 hours. Coagulation Profile: Recent Labs  Lab 11/13/21 1301  INR 1.1    Cardiac Enzymes: No results for input(s): CKTOTAL, CKMB, CKMBINDEX, TROPONINI in the last 168 hours. BNP (last 3 results) No results for input(s): PROBNP in the last 8760 hours. HbA1C: No results for input(s): HGBA1C in the last 72 hours.  CBG: Recent Labs  Lab 11/16/21 0808 11/16/21 1655 11/16/21 2132 11/17/21 0714 11/17/21 1112  GLUCAP 185* 109* 154* 99 148*    Lipid Profile: No results for input(s): CHOL, HDL, LDLCALC, TRIG, CHOLHDL, LDLDIRECT in the last 72 hours. Thyroid Function Tests: No results for input(s): TSH, T4TOTAL, FREET4, T3FREE, THYROIDAB in the last 72 hours. Anemia Panel: No results for input(s): VITAMINB12, FOLATE, FERRITIN, TIBC, IRON, RETICCTPCT in the last 72 hours. Sepsis Labs: Recent Labs  Lab 11/13/21 1301 11/13/21 1501  LATICACIDVEN 1.5 1.5     Recent Results (from the past 240 hour(s))  Culture, blood (Routine x 2)     Status: None (Preliminary result)   Collection Time: 11/13/21  1:01 PM   Specimen: BLOOD  Result Value Ref Range Status   Specimen Description   Final    BLOOD BLOOD LEFT HAND Performed at [facility], [place] [address], [place], [place] [address]    Special Requests   Final    BOTTLES DRAWN AEROBIC AND ANAEROBIC Blood Culture results may not be optimal due to an excessive volume of blood received in culture bottles Performed at [place] [address] [place], [place] [address]    Culture   Final    NO GROWTH 4 DAYS Performed at [facility], [place] [address], [place], [place] [address]    Report Status PENDING  Incomplete  Culture, blood (Routine x 2)     Status: None (Preliminary result)   Collection Time: 11/13/21  1:06 PM   Specimen: BLOOD  Result Value Ref Range Status   Specimen Description   Final    BLOOD LEFT ANTECUBITAL Performed at [facility] [address] [place], [place] [address]    Special Requests   Final    BOTTLES DRAWN AEROBIC AND ANAEROBIC Blood Culture adequate volume Performed at [facility] [address], [place], [place] [address]    Culture   Final    NO GROWTH 4 DAYS Performed at [facility], [place] [address], [place], [place] [address]    Report Status PENDING  Incomplete      Radiology Studies: CT TIBIA FIBULA LEFT W CONTRAST  Result Date: 11/16/2021 CLINICAL DATA:  Insert his EXAM: CT OF THE LOWER LEFT EXTREMITY WITH CONTRAST CT OF THE LOWER LEFT FOOT WITH CONTRAST TECHNIQUE: Multidetector CT imaging of the lower left extremity was performed according to the standard protocol following intravenous contrast administration. CONTRAST:  142mL OMNIPAQUE IOHEXOL 350 MG/ML SOLN COMPARISON:  Left foot radiograph 11/15/2021 FINDINGS: Bones/Joint/Cartilage Prior great toe amputation. There is no frank bony destruction in the left foot. There is mild hindfoot osteoarthritis. Dorsal midfoot spurring. Plantar calcaneal spurring. There is no acute osseous abnormality involving the tibia or fibula. 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 There is  chronic fragmentation of the tibial tuberosity likely sequela of prior Osgood-Schlatter. Ligaments Suboptimally assessed by CT. Muscles and Tendons No muscle atrophy.  No intramuscular collection. Soft tissues Soft tissue swelling of the foot superficially into a lesser degree along  the lower leg with skin thickening. No well-defined fluid collection on CT. No soft tissue gas. IMPRESSION: Skin thickening and superficial soft tissue swelling of the foot and to a lesser degree the lower leg, as can be seen in cellulitis or lymphedema. Prior great toe amputation with adjacent soft tissue thickening. No evidence of soft tissue abscess or bony destruction to suggest osteomyelitis on CT. MRI would be more sensitive. Electronically Signed   By: Maurine Simmering M.D.   On: 11/16/2021 18:00   CT FOOT RIGHT W CONTRAST  Result Date: 11/16/2021 CLINICAL DATA:  Soft tissue infection suspected, lower leg, xray done; Foot swelling, diabetic, osteomyelitis suspected, xray done EXAM: CT OF THE LOWER RIGHT EXTREMITY WITH CONTRAST CT OF THE RIGHT FOOT WITH CONTRAST TECHNIQUE: Multidetector CT imaging of the lower right extremity and right foot was performed according to the standard protocol following intravenous contrast administration. CONTRAST:  188mL OMNIPAQUE IOHEXOL 350 MG/ML SOLN COMPARISON:  Right foot radiograph 11/15/2021 FINDINGS: Bones/Joint/Cartilage Prior great toe amputation. No frank bony destruction in the foot. Mild hindfoot osteoarthritis. Os trigonum. Plantar calcaneal spur. Dorsal midfoot spurring. There is no acute osseous abnormality involving the tibia or fibula. There is chronic tibial tuberosity fragmentation likely sequela of prior Osgood-Schlatter. There is no frank bony destruction to suggest osteomyelitis on CT. There is mild medial patellofemoral predominant osteoarthritis of the knee. Ligaments Suboptimally assessed by CT. Muscles and Tendons No muscle atrophy.  No intramuscular collection. Soft tissues  There is skin thickening and soft tissue swelling of the right foot and to a lesser degree of the lower leg. There is no rim enhancing fluid collection. No soft tissue gas. IMPRESSION: Skin thickening and soft tissue swelling of the right foot and to a lesser degree of the right lower leg, as can be seen in cellulitis or lymphedema. Prior great toe amputation with adjacent soft tissue thickening. No evidence of soft tissue abscess or bony destruction to suggest osteomyelitis on CT. MRI would be more sensitive. Electronically Signed   By: Maurine Simmering M.D.   On: 11/16/2021 18:06   CT FOOT LEFT W CONTRAST  Result Date: 11/16/2021 CLINICAL DATA:  Insert his EXAM: CT OF THE LOWER LEFT EXTREMITY WITH CONTRAST CT OF THE LOWER LEFT FOOT WITH CONTRAST TECHNIQUE: Multidetector CT imaging of the lower left extremity was performed according to the standard protocol following intravenous contrast administration. CONTRAST:  177mL OMNIPAQUE IOHEXOL 350 MG/ML SOLN COMPARISON:  Left foot radiograph 11/15/2021 FINDINGS: Bones/Joint/Cartilage Prior great toe amputation. There is no frank bony destruction in the left foot. There is mild hindfoot osteoarthritis. Dorsal midfoot spurring. Plantar calcaneal spurring. There is no acute osseous abnormality involving the tibia or fibula. 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 There is chronic fragmentation of the tibial tuberosity likely sequela of prior Osgood-Schlatter. Ligaments Suboptimally assessed by CT. Muscles and Tendons No muscle atrophy.  No intramuscular collection. Soft tissues Soft tissue swelling of the foot superficially into a lesser degree along the lower leg with skin thickening. No well-defined fluid collection on CT. No soft tissue gas. IMPRESSION: Skin thickening and superficial soft tissue swelling of the foot and to a lesser degree the lower leg, as can be seen in cellulitis or lymphedema. Prior great toe amputation with adjacent soft tissue  thickening. No evidence of soft tissue abscess or bony destruction to suggest osteomyelitis on CT. MRI would be more sensitive. Electronically Signed   By: Maurine Simmering M.D.   On: 11/16/2021 18:00   CT TIBIA  FIBULA RIGHT W CONTRAST  Result Date: 11/16/2021 CLINICAL DATA:  Soft tissue infection suspected, lower leg, xray done; Foot swelling, diabetic, osteomyelitis suspected, xray done EXAM: CT OF THE LOWER RIGHT EXTREMITY WITH CONTRAST CT OF THE RIGHT FOOT WITH CONTRAST TECHNIQUE: Multidetector CT imaging of the lower right extremity and right foot was performed according to the standard protocol following intravenous contrast administration. CONTRAST:  161mL OMNIPAQUE IOHEXOL 350 MG/ML SOLN COMPARISON:  Right foot radiograph 11/15/2021 FINDINGS: Bones/Joint/Cartilage Prior great toe amputation. No frank bony destruction in the foot. Mild hindfoot osteoarthritis. Os trigonum. Plantar calcaneal spur. Dorsal midfoot spurring. There is no acute osseous abnormality involving the tibia or fibula. There is chronic tibial tuberosity fragmentation likely sequela of prior Osgood-Schlatter. There is no frank bony destruction to suggest osteomyelitis on CT. There is mild medial patellofemoral predominant osteoarthritis of the knee. Ligaments Suboptimally assessed by CT. Muscles and Tendons No muscle atrophy.  No intramuscular collection. Soft tissues There is skin thickening and soft tissue swelling of the right foot and to a lesser degree of the lower leg. There is no rim enhancing fluid collection. No soft tissue gas. IMPRESSION: Skin thickening and soft tissue swelling of the right foot and to a lesser degree of the right lower leg, as can be seen in cellulitis or lymphedema. Prior great toe amputation with adjacent soft tissue thickening. No evidence of soft tissue abscess or bony destruction to suggest osteomyelitis on CT. MRI would be more sensitive. Electronically Signed   By: Maurine Simmering M.D.   On: 11/16/2021 18:06     Scheduled Meds:  bacitracin   Topical BID   carbamide peroxide  5 drop Right EAR BID   carvedilol  3.125 mg Oral BID   enoxaparin (LOVENOX) injection  40 mg Subcutaneous Q24H   feeding supplement  237 mL Oral BID BM   furosemide  40 mg Intravenous BID   hydrocerin   Topical Daily   insulin aspart  0-15 Units Subcutaneous TID WC   multivitamin with minerals  1 tablet Oral Daily   nutrition supplement (JUVEN)  1 packet Oral BID BM   OLANZapine  5 mg Oral QHS   sacubitril-valsartan  1 tablet Oral BID   spironolactone  12.5 mg Oral Daily   Continuous Infusions:  ceFEPime (MAXIPIME) IV 2 g (11/17/21 1403)   vancomycin Stopped (11/17/21 1501)      LOS: 3 days   Marylu Lund, MD [facility] Pager On Amion  If 7PM-7AM, please contact night-coverage 11/17/2021, 3:39 PM

## 2021-11-17 NOTE — Progress Notes (Signed)
Patient c/o pain and medication was pulled from the pyxis. Patient was advised of need to recheck vitals signs/became upset and reports he will just get his medicine from day shift. Pain medication returned to the pyxis.

## 2021-11-17 NOTE — Progress Notes (Signed)
Alexis, NT reports patient BP 197/130 HR 74 and refusing recheck. Patient advised of need to recheck vital signs due to abnormal (high readings) and complications that can arise due to elevated BP with his previous history of CVA. Patient reports he will just wait until later on day shift. On-Call NP ([place]) made aware and acknowledged.

## 2021-11-17 NOTE — TOC Progression Note (Signed)
Transition of Care [facility]) - Progression Note    Patient Details  Name: Erlin Gardella MRN: [id number] Date of Birth: Feb 01, 1966  Transition of [facility] CM/SW Contact  Stevi Hollinshead, Marta Lamas, [place] Phone Number: 11/17/2021, 4:21 PM  Clinical Narrative:     Request received to speak with patient to provide resources.  Met with patient at bedside, who reported the following needs:  Change Medicaid coverage from [place], [place] to [place], [place]. Wants motorized scooter arranged. Would like [facility] or [place] placement, preferably not in [facility].  ~  Would be willing to return to [place], if they will accept him back.  ~  Patient does not wish to receive a list of alcohol/drug abuse resources or treatment programs. Patient provided with a list of transportation services/resources. Homeless, with no significant support in the area. CSW will follow-up on 11/18/2021.  Expected Discharge Plan: Homeless Shelter Barriers to Discharge: Continued Medical Work up  Expected Discharge Plan and Services Expected Discharge Plan: Homeless Shelter   Discharge Planning Services: CM Consult   Living arrangements for the past 2 months: Homeless Shelter                                       Social Determinants of Health (SDOH) Interventions    Readmission Risk Interventions Readmission Risk Prevention Plan 03/05/2021 01/07/2021 12/10/2020  Transportation Screening Complete Complete Complete  PCP or Specialist Appt within 3-5 Days - Complete -  HRI or [place] - Complete -  Social Work Consult for [place] Planning/Counseling - Complete -  Palliative Care Screening - Complete -  Medication Review [employer] photographer) Complete Complete Referral to Pharmacy  PCP or Specialist appointment within 3-5 days of discharge Complete - Complete  HRI or Home Care Consult Complete - Complete  SW Recovery Care/Counseling Consult Complete -  Complete  Palliative Care Screening Not Applicable - Not Applicable  Skilled Nursing Facility Complete - Not Applicable  Some recent data might be hidden

## 2021-11-17 NOTE — Progress Notes (Signed)
Patient refusing am dressing change to BLE's.

## 2021-11-18 LAB — COMPREHENSIVE METABOLIC PANEL
ALT: 15 U/L (ref 0–44)
AST: 15 U/L (ref 15–41)
Albumin: 3.3 g/dL — ABNORMAL LOW (ref 3.5–5.0)
Alkaline Phosphatase: 50 U/L (ref 38–126)
Anion gap: 9 (ref 5–15)
BUN: 26 mg/dL — ABNORMAL HIGH (ref 6–20)
CO2: 25 mmol/L (ref 22–32)
Calcium: 9.3 mg/dL (ref 8.9–10.3)
Chloride: 102 mmol/L (ref 98–111)
Creatinine, Ser: 1.05 mg/dL (ref 0.61–1.24)
GFR, Estimated: >60 mL/min (ref >60)
Glucose, Bld: 113 mg/dL — ABNORMAL HIGH (ref 70–99)
Potassium: 4.1 mmol/L (ref 3.5–5.1)
Sodium: 136 mmol/L (ref 135–145)
Total Bilirubin: 0.5 mg/dL (ref 0.3–1.2)
Total Protein: 7.7 g/dL (ref 6.5–8.1)

## 2021-11-18 LAB — CBC
HCT: 39.8 % (ref 39.0–52.0)
Hemoglobin: 13.4 g/dL (ref 13.0–17.0)
MCH: 31.9 pg (ref 26.0–34.0)
MCHC: 33.7 g/dL (ref 30.0–36.0)
MCV: 94.8 fL (ref 80.0–100.0)
Platelets: 341 10*3/uL (ref 150–400)
RBC: 4.2 MIL/uL — ABNORMAL LOW (ref 4.22–5.81)
RDW: 13.1 % (ref 11.5–15.5)
WBC: 5.1 10*3/uL (ref 4.0–10.5)
nRBC: 0 % (ref 0.0–0.2)

## 2021-11-18 LAB — CULTURE, BLOOD (ROUTINE X 2)
Culture: NO GROWTH
Culture: NO GROWTH
Special Requests: ADEQUATE

## 2021-11-18 LAB — GLUCOSE, CAPILLARY
Glucose-Capillary: 101 mg/dL — ABNORMAL HIGH (ref 70–99)
Glucose-Capillary: 128 mg/dL — ABNORMAL HIGH (ref 70–99)
Glucose-Capillary: 154 mg/dL — ABNORMAL HIGH (ref 70–99)

## 2021-11-18 MED ORDER — FUROSEMIDE 10 MG/ML IJ SOLN: 40.0000 mg | Freq: Two times a day (BID) | INTRAMUSCULAR | Status: DC

## 2021-11-18 MED ORDER — FUROSEMIDE 10 MG/ML IJ SOLN
40.0000 mg | Freq: Two times a day (BID) | INTRAMUSCULAR | Status: DC
  Administered 2021-11-18: 17:00:00 40 mg via INTRAVENOUS
  Filled 2021-11-18: qty 4

## 2021-11-18 MED ORDER — GABAPENTIN 300 MG PO CAPS
300.0000 mg | ORAL_CAPSULE | Freq: Two times a day (BID) | ORAL | Status: DC
  Administered 2021-11-18 – 2021-11-19 (×2): 300 mg via ORAL
  Filled 2021-11-18 (×2): qty 1

## 2021-11-18 NOTE — Progress Notes (Signed)
Pt refused an assessment this morning.

## 2021-11-18 NOTE — Progress Notes (Signed)
PROGRESS NOTE    [name]  ASN:[id number] DOB: 10-Apr-1966 DOA: 11/13/2021 PCP: Pcp, No    Brief Narrative:   55 y.o. male with medical history significant of asthma, bipolar 1 disorder, depression, chronic combined systolic and diastolic CHF, COPD, GERD, homelessness, hypertension, QT prolongation, schizoaffective disorder who is coming to the emergency department due to worsening of right lower extremity cellulitis.  No new trauma to the area.  He denies fever, chills, night sweats feels malaised.  He does not know how his blood glucose have been doing.  He is not currently on diabetes medications.  No rhinorrhea, sore throat, dyspnea, productive cough, wheezing or hemoptysis.  Denied chest pain, palpitations, diaphoresis, PND, orthopnea or pitting edema of the lower extremities.  No dysuria, frequency or hematuria.  No polyuria, polydipsia, polyphagia or blurred vision.  Assessment & Plan:   Principal Problem:   Cellulitis of right lower extremity Active Problems:   Hypertension   CHF exacerbation (HCC)   CAD (coronary artery disease)   Bipolar I disorder (HCC)   Homelessness   HLD (hyperlipidemia)   Type 2 diabetes mellitus (HCC)  Principal Problem:   Cellulitis of right lower extremity ruled in Tmax of 100.2 noted over past 24hrs. No leukocytosis Marked LE edema bilaterally has improved with lasix, however skin now feels warm Pt reporting increased pain Was continued on cefazolin 1 g every 8 hours, given recent increased pain, will continue broadened coverage with vanc with cefepime Analgesics as needed. Pain still poorly controlled. Cont with IV dilaudid PRN Suspect underlying neuropathy. Will add neurontin. Pt has a documented reaction to lyrica BLE CT reviewed with patient. Findings of celluliits w/o florid osteomyelitis and no evidence of necrotizing faciitis  Given open wound, will consult WOC   Active Problems:   Type 2 diabetes Mellitus Carbohydrate modified  diet. Check hemoglobin A1c. CBG monitoring with RI SS although pt has been refusing finger sticks Cont to recommend follow-up with PCP as an outpatient when discharged     Hypertension On carvedilol 3.125 mg p.o. twice daily. All also on Entresto twice daily. BP labile. Documented to have refused repeat checks or intervention    Acute on Chronic combined systolic and diastolic HF Continue carvedilol as above. Continue Entresto twice daily. Most recent EF of <20% with grade 2 diastolic dysfunction Presented with marked BLE edema with blistering Edema has improved markedly with IV lasix     CAD (coronary artery disease) Allergic to aspirin. Cannot for clopidogrel. Still smoking and not taking statin. Continue carvedilol 3.125 mg p.o. twice daily. Pt with recurrent tox screens pos for cocaine. Will absolutely need cessation and for pt to abstain from illicit substances     Bipolar I disorder (HCC) Continue Zyprexa 5 mg p.o. bedtime.     Homelessness TOC consulted     HLD (hyperlipidemia) No longer on statin. Follow-up with primary care provider after discharge   DVT prophylaxis: Lovenox subq Code Status: Full Family Communication: Pt in room, family not at bedside  Status is: Inpatient  Requires continued inpatient status because: Severity of illness    Consultants:    Procedures:    Antimicrobials: Anti-infectives (From admission, onward)    Start     Dose/Rate Route Frequency Ordered Stop   11/17/21 0400  vancomycin (VANCOREADY) IVPB 1250 mg/250 mL        1,250 mg 166.7 mL/hr over 90 Minutes Intravenous Every 12 hours 11/16/21 1422 11/23/21 1759   11/16/21 2200  ceFEPIme (MAXIPIME) 2 g in sodium  chloride 0.9 % 100 mL IVPB        2 g 200 mL/hr over 30 Minutes Intravenous Every 8 hours 11/16/21 1413 11/23/21 1359   11/16/21 1430  vancomycin (VANCOREADY) IVPB 2000 mg/400 mL        2,000 mg 200 mL/hr over 120 Minutes Intravenous  Once 11/16/21 1341 11/17/21  0840   11/16/21 1430  ceFEPIme (MAXIPIME) 2 g in sodium chloride 0.9 % 100 mL IVPB        2 g 200 mL/hr over 30 Minutes Intravenous  Once 11/16/21 1341 11/17/21 0840   11/15/21 2000  ceFAZolin (ANCEF) IVPB 1 g/50 mL premix  Status:  Discontinued        1 g 100 mL/hr over 30 Minutes Intravenous Every 8 hours 11/15/21 1851 11/16/21 1341   11/13/21 1500  ceFAZolin (ANCEF) IVPB 1 g/50 mL premix  Status:  Discontinued        1 g 100 mL/hr over 30 Minutes Intravenous Every 8 hours 11/13/21 1458 11/14/21 1507   11/13/21 1415  vancomycin (VANCOREADY) IVPB 1500 mg/300 mL  Status:  Discontinued        1,500 mg 150 mL/hr over 120 Minutes Intravenous  Once 11/13/21 1412 11/13/21 1413       Subjective: Complaining of burning pain in RLE  Objective: Vitals:   11/17/21 0504 11/17/21 1401 11/17/21 2046 11/18/21 0437  BP: (!) 197/130 135/90 (!) 152/97 (!) 140/92  Pulse: 74 98 82 76  Resp: 18 14 18 18   Temp: 97.9 F (36.6 C) 98.8 F (37.1 C) 98.3 F (36.8 C) 98.4 F (36.9 C)  TempSrc: Oral Oral Oral Oral  SpO2: 98% 99% 97% 100%  Weight:      Height:        Intake/Output Summary (Last 24 hours) at 11/18/2021 1604 Last data filed at 11/18/2021 0900 Gross per 24 hour  Intake 909.93 ml  Output 2725 ml  Net -1815.07 ml    Filed Weights   11/13/21 1700  Weight: 90.5 kg    Examination: General exam: Awake, laying in bed, in nad Respiratory system: Normal respiratory effort, no wheezing Cardiovascular system: regular rate, s1, s2 Gastrointestinal system: Soft, nondistended, positive BS Central nervous system: CN2-12 grossly intact, strength intact Extremities: Perfused, no clubbing Skin: Normal skin turgor, no notable skin lesions seen, open skin lesions over medial and lateral R leg Psychiatry: Mood normal // no visual hallucinations   Data Reviewed: I have personally reviewed following labs and imaging studies  CBC: Recent Labs  Lab 11/13/21 1301 11/15/21 0436 11/16/21 0457  11/17/21 0449 11/18/21 0412  WBC 8.7 9.2 6.8 5.4 5.1  NEUTROABS 5.5  --   --   --   --   HGB 14.9 12.1* 12.7* 12.8* 13.4  HCT 44.1 35.6* 37.6* 37.5* 39.8  MCV 95.9 95.4 94.9 94.5 94.8  PLT 283 254 299 328 [id number]    Basic Metabolic Panel: Recent Labs  Lab 11/13/21 1301 11/15/21 0436 11/16/21 0457 11/17/21 0449 11/18/21 0412  NA 134* 137 136 136 136  K 3.5 3.6 3.7 3.9 4.1  CL 96* 100 100 103 102  CO2 28 25 26 24 25   GLUCOSE 123* 105* 132* 103* 113*  BUN 12 13 17 19  26*  CREATININE 1.01 1.01 1.06 1.03 1.05  CALCIUM 9.1 8.5* 8.7* 8.6* 9.3    GFR: Estimated Creatinine Clearance: 90.5 mL/min (by C-G formula based on SCr of 1.05 mg/dL). Liver Function Tests: Recent Labs  Lab 11/13/21 1301 11/15/21 0436 11/16/21  [id number] 11/17/21 0449 11/18/21 0412  AST 22 14* 16 14* 15  ALT 17 12 13 13 15   ALKPHOS 78 50 50 45 50  BILITOT 1.1 0.5 0.5 0.5 0.5  PROT 8.4* 6.6 7.2 7.2 7.7  ALBUMIN 3.8 2.9* 3.0* 3.0* 3.3*    No results for input(s): LIPASE, AMYLASE in the last 168 hours. No results for input(s): AMMONIA in the last 168 hours. Coagulation Profile: Recent Labs  Lab 11/13/21 1301  INR 1.1    Cardiac Enzymes: No results for input(s): CKTOTAL, CKMB, CKMBINDEX, TROPONINI in the last 168 hours. BNP (last 3 results) No results for input(s): PROBNP in the last 8760 hours. HbA1C: No results for input(s): HGBA1C in the last 72 hours.  CBG: Recent Labs  Lab 11/17/21 0714 11/17/21 1112 11/17/21 1616 11/17/21 2050 11/18/21 0716  GLUCAP 99 148* 119* 166* 101*    Lipid Profile: No results for input(s): CHOL, HDL, LDLCALC, TRIG, CHOLHDL, LDLDIRECT in the last 72 hours. Thyroid Function Tests: No results for input(s): TSH, T4TOTAL, FREET4, T3FREE, THYROIDAB in the last 72 hours. Anemia Panel: No results for input(s): VITAMINB12, FOLATE, FERRITIN, TIBC, IRON, RETICCTPCT in the last 72 hours. Sepsis Labs: Recent Labs  Lab 11/13/21 1301 11/13/21 1501  LATICACIDVEN 1.5 1.5      Recent Results (from the past 240 hour(s))  Culture, blood (Routine x 2)     Status: None   Collection Time: 11/13/21  1:01 PM   Specimen: BLOOD  Result Value Ref Range Status   Specimen Description   Final    BLOOD BLOOD LEFT HAND Performed at [facility], [place] [address] [place], [place] [address]    Special Requests   Final    BOTTLES DRAWN AEROBIC AND ANAEROBIC Blood Culture results may not be optimal due to an excessive volume of blood received in culture bottles Performed at [place] [address] [place], [place] [address]    Culture   Final    NO GROWTH 5 DAYS Performed at [facility], [place] [address] [place], [place] [address]    Report Status 11/18/2021 FINAL  Final  Culture, blood (Routine x 2)     Status: None   Collection Time: 11/13/21  1:06 PM   Specimen: BLOOD  Result Value Ref Range Status   Specimen Description   Final    BLOOD LEFT ANTECUBITAL Performed at [place] [address], [place], [place] [address]    Special Requests   Final    BOTTLES DRAWN AEROBIC AND ANAEROBIC Blood Culture adequate volume Performed at [place] [address] [place], [place] [address]    Culture   Final    NO GROWTH 5 DAYS Performed at [facility], [place] [address], [place], [place] [address]    Report Status 11/18/2021 FINAL  Final      Radiology Studies: CT TIBIA FIBULA LEFT W CONTRAST  Result Date: 11/16/2021 CLINICAL DATA:  Insert his EXAM: CT OF THE LOWER LEFT EXTREMITY WITH CONTRAST CT OF THE LOWER LEFT FOOT WITH CONTRAST TECHNIQUE: Multidetector CT imaging of the lower left extremity was performed according to the standard protocol following intravenous contrast administration. CONTRAST:  158mL OMNIPAQUE IOHEXOL 350 MG/ML SOLN COMPARISON:  Left foot radiograph 11/15/2021 FINDINGS: Bones/Joint/Cartilage Prior great toe amputation. There is no frank bony  destruction in the left foot. There is mild hindfoot osteoarthritis. Dorsal midfoot spurring. Plantar calcaneal spurring. There is no acute osseous abnormality  involving the tibia or fibula. 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 There is chronic fragmentation of the tibial tuberosity likely sequela of prior Osgood-Schlatter. Ligaments Suboptimally assessed by CT. Muscles and Tendons No muscle atrophy.  No intramuscular collection. Soft tissues Soft tissue swelling of the foot superficially into a lesser degree along the lower leg with skin thickening. No well-defined fluid collection on CT. No soft tissue gas. IMPRESSION: Skin thickening and superficial soft tissue swelling of the foot and to a lesser degree the lower leg, as can be seen in cellulitis or lymphedema. Prior great toe amputation with adjacent soft tissue thickening. No evidence of soft tissue abscess or bony destruction to suggest osteomyelitis on CT. MRI would be more sensitive. Electronically Signed   By: Maurine Simmering M.D.   On: 11/16/2021 18:00   CT FOOT RIGHT W CONTRAST  Result Date: 11/16/2021 CLINICAL DATA:  Soft tissue infection suspected, lower leg, xray done; Foot swelling, diabetic, osteomyelitis suspected, xray done EXAM: CT OF THE LOWER RIGHT EXTREMITY WITH CONTRAST CT OF THE RIGHT FOOT WITH CONTRAST TECHNIQUE: Multidetector CT imaging of the lower right extremity and right foot was performed according to the standard protocol following intravenous contrast administration. CONTRAST:  11mL OMNIPAQUE IOHEXOL 350 MG/ML SOLN COMPARISON:  Right foot radiograph 11/15/2021 FINDINGS: Bones/Joint/Cartilage Prior great toe amputation. No frank bony destruction in the foot. Mild hindfoot osteoarthritis. Os trigonum. Plantar calcaneal spur. Dorsal midfoot spurring. There is no acute osseous abnormality involving the tibia or fibula. There is chronic tibial tuberosity fragmentation likely sequela of prior Osgood-Schlatter. There  is no frank bony destruction to suggest osteomyelitis on CT. There is mild medial patellofemoral predominant osteoarthritis of the knee. Ligaments Suboptimally assessed by CT. Muscles and Tendons No muscle atrophy.  No intramuscular collection. Soft tissues There is skin thickening and soft tissue swelling of the right foot and to a lesser degree of the lower leg. There is no rim enhancing fluid collection. No soft tissue gas. IMPRESSION: Skin thickening and soft tissue swelling of the right foot and to a lesser degree of the right lower leg, as can be seen in cellulitis or lymphedema. Prior great toe amputation with adjacent soft tissue thickening. No evidence of soft tissue abscess or bony destruction to suggest osteomyelitis on CT. MRI would be more sensitive. Electronically Signed   By: Maurine Simmering M.D.   On: 11/16/2021 18:06   CT FOOT LEFT W CONTRAST  Result Date: 11/16/2021 CLINICAL DATA:  Insert his EXAM: CT OF THE LOWER LEFT EXTREMITY WITH CONTRAST CT OF THE LOWER LEFT FOOT WITH CONTRAST TECHNIQUE: Multidetector CT imaging of the lower left extremity was performed according to the standard protocol following intravenous contrast administration. CONTRAST:  136mL OMNIPAQUE IOHEXOL 350 MG/ML SOLN COMPARISON:  Left foot radiograph 11/15/2021 FINDINGS: Bones/Joint/Cartilage Prior great toe amputation. There is no frank bony destruction in the left foot. There is mild hindfoot osteoarthritis. Dorsal midfoot spurring. Plantar calcaneal spurring. There is no acute osseous abnormality involving the tibia or fibula. 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 There is chronic fragmentation of the tibial tuberosity likely sequela of prior Osgood-Schlatter. Ligaments Suboptimally assessed by CT. Muscles and Tendons No muscle atrophy.  No intramuscular collection. Soft tissues Soft tissue swelling of the foot superficially into a lesser degree along the lower leg with skin thickening. No well-defined  fluid collection on CT. No soft tissue gas. IMPRESSION: Skin thickening and superficial soft tissue swelling of the foot and to a lesser degree the lower  leg, as can be seen in cellulitis or lymphedema. Prior great toe amputation with adjacent soft tissue thickening. No evidence of soft tissue abscess or bony destruction to suggest osteomyelitis on CT. MRI would be more sensitive. Electronically Signed   By: Maurine Simmering M.D.   On: 11/16/2021 18:00   CT TIBIA FIBULA RIGHT W CONTRAST  Result Date: 11/16/2021 CLINICAL DATA:  Soft tissue infection suspected, lower leg, xray done; Foot swelling, diabetic, osteomyelitis suspected, xray done EXAM: CT OF THE LOWER RIGHT EXTREMITY WITH CONTRAST CT OF THE RIGHT FOOT WITH CONTRAST TECHNIQUE: Multidetector CT imaging of the lower right extremity and right foot was performed according to the standard protocol following intravenous contrast administration. CONTRAST:  171mL OMNIPAQUE IOHEXOL 350 MG/ML SOLN COMPARISON:  Right foot radiograph 11/15/2021 FINDINGS: Bones/Joint/Cartilage Prior great toe amputation. No frank bony destruction in the foot. Mild hindfoot osteoarthritis. Os trigonum. Plantar calcaneal spur. Dorsal midfoot spurring. There is no acute osseous abnormality involving the tibia or fibula. There is chronic tibial tuberosity fragmentation likely sequela of prior Osgood-Schlatter. There is no frank bony destruction to suggest osteomyelitis on CT. There is mild medial patellofemoral predominant osteoarthritis of the knee. Ligaments Suboptimally assessed by CT. Muscles and Tendons No muscle atrophy.  No intramuscular collection. Soft tissues There is skin thickening and soft tissue swelling of the right foot and to a lesser degree of the lower leg. There is no rim enhancing fluid collection. No soft tissue gas. IMPRESSION: Skin thickening and soft tissue swelling of the right foot and to a lesser degree of the right lower leg, as can be seen in cellulitis or  lymphedema. Prior great toe amputation with adjacent soft tissue thickening. No evidence of soft tissue abscess or bony destruction to suggest osteomyelitis on CT. MRI would be more sensitive. Electronically Signed   By: Maurine Simmering M.D.   On: 11/16/2021 18:06    Scheduled Meds:  bacitracin   Topical BID   carbamide peroxide  5 drop Right EAR BID   carvedilol  3.125 mg Oral BID   enoxaparin (LOVENOX) injection  40 mg Subcutaneous Q24H   feeding supplement  237 mL Oral BID BM   furosemide  40 mg Intravenous BID   gabapentin  300 mg Oral BID   hydrocerin   Topical Daily   insulin aspart  0-15 Units Subcutaneous TID WC   multivitamin with minerals  1 tablet Oral Daily   nutrition supplement (JUVEN)  1 packet Oral BID BM   OLANZapine  5 mg Oral QHS   sacubitril-valsartan  1 tablet Oral BID   spironolactone  12.5 mg Oral Daily   Continuous Infusions:  ceFEPime (MAXIPIME) IV 2 g (11/18/21 1422)   vancomycin 1,250 mg (11/18/21 0542)      LOS: 4 days   Marylu Lund, MD [facility] Pager On Amion  If 7PM-7AM, please contact night-coverage 11/18/2021, 4:04 PM

## 2021-11-19 LAB — COMPREHENSIVE METABOLIC PANEL
ALT: 14 U/L (ref 0–44)
AST: 18 U/L (ref 15–41)
Albumin: 3.5 g/dL (ref 3.5–5.0)
Alkaline Phosphatase: 53 U/L (ref 38–126)
Anion gap: 12 (ref 5–15)
BUN: 26 mg/dL — ABNORMAL HIGH (ref 6–20)
CO2: 22 mmol/L (ref 22–32)
Calcium: 9.3 mg/dL (ref 8.9–10.3)
Chloride: 100 mmol/L (ref 98–111)
Creatinine, Ser: 1.06 mg/dL (ref 0.61–1.24)
GFR, Estimated: >60 mL/min (ref >60)
Glucose, Bld: 118 mg/dL — ABNORMAL HIGH (ref 70–99)
Potassium: 3.9 mmol/L (ref 3.5–5.1)
Sodium: 134 mmol/L — ABNORMAL LOW (ref 135–145)
Total Bilirubin: 0.5 mg/dL (ref 0.3–1.2)
Total Protein: 8.4 g/dL — ABNORMAL HIGH (ref 6.5–8.1)

## 2021-11-19 LAB — GLUCOSE, CAPILLARY
Glucose-Capillary: 102 mg/dL — ABNORMAL HIGH (ref 70–99)
Glucose-Capillary: 80 mg/dL (ref 70–99)
Glucose-Capillary: 198 mg/dL — ABNORMAL HIGH (ref 70–99)

## 2021-11-19 LAB — CBC
HCT: 42.3 % (ref 39.0–52.0)
Hemoglobin: 14.2 g/dL (ref 13.0–17.0)
MCH: 31.5 pg (ref 26.0–34.0)
MCHC: 33.6 g/dL (ref 30.0–36.0)
MCV: 93.8 fL (ref 80.0–100.0)
Platelets: 394 10*3/uL (ref 150–400)
RBC: 4.51 MIL/uL (ref 4.22–5.81)
RDW: 13.1 % (ref 11.5–15.5)
WBC: 5.2 10*3/uL (ref 4.0–10.5)
nRBC: 0 % (ref 0.0–0.2)

## 2021-11-19 MED ORDER — PREGABALIN 100 MG PO CAPS
100.0000 mg | ORAL_CAPSULE | Freq: Every day | ORAL | Status: DC
  Administered 2021-11-19 – 2021-11-20 (×2): 100 mg via ORAL
  Filled 2021-11-19 (×2): qty 1

## 2021-11-19 MED ORDER — DIPHENHYDRAMINE-ZINC ACETATE 2-0.1 % EX CREA
TOPICAL_CREAM | Freq: Three times a day (TID) | CUTANEOUS | Status: DC | PRN
  Administered 2021-11-20: 1 via TOPICAL
  Filled 2021-11-19 (×2): qty 28

## 2021-11-19 MED ORDER — CEFAZOLIN SODIUM-DEXTROSE 2-4 GM/100ML-% IV SOLN
2.0000 g | Freq: Three times a day (TID) | INTRAVENOUS | Status: DC
  Administered 2021-11-19 – 2021-11-20 (×4): 2 g via INTRAVENOUS
  Filled 2021-11-19 (×7): qty 100

## 2021-11-19 NOTE — Consult Note (Signed)
WOC Nurse wound follow up Reconsulted for LE wounds.  Discussed with Bedside RN, E. Sherrill via [employer] feature of EHR who reports that LEs are without ulceration, but that dry areas with crusted dried serum remain. Recommendation for daily cleanse followed by application of Eucerin cream, covering of any deflated blisters with xeroform and securing with Kerlix roll gauze made Thursday, 12/15 are still appropriate. It is noted that patient has been refusing assessments, personal care.  WOC nursing team will not follow, but will remain available to this patient, the nursing and medical teams.  Please re-consult if needed. Thanks, Ladona Mow, MSN, RN, GNP, [facility] [place]  Pager# [phone]

## 2021-11-19 NOTE — Progress Notes (Signed)
Right leg was cleansed and dressed with xeroform, gauze and curlex per orders.

## 2021-11-19 NOTE — Progress Notes (Signed)
PROGRESS NOTE    [name]  XFG:[id number] DOB: 1965-12-12 DOA: 11/13/2021 PCP: Pcp, No    Brief Narrative:   55 y.o. male with medical history significant of asthma, bipolar 1 disorder, depression, chronic combined systolic and diastolic CHF, COPD, GERD, homelessness, hypertension, QT prolongation, schizoaffective disorder who is coming to the emergency department due to worsening of right lower extremity cellulitis.  No new trauma to the area.  He denies fever, chills, night sweats feels malaised.  He does not know how his blood glucose have been doing.  He is not currently on diabetes medications.  No rhinorrhea, sore throat, dyspnea, productive cough, wheezing or hemoptysis.  Denied chest pain, palpitations, diaphoresis, PND, orthopnea or pitting edema of the lower extremities.  No dysuria, frequency or hematuria.  No polyuria, polydipsia, polyphagia or blurred vision.  Assessment & Plan:   Principal Problem:   Cellulitis of right lower extremity Active Problems:   Hypertension   CHF exacerbation (HCC)   CAD (coronary artery disease)   Bipolar I disorder (HCC)   Homelessness   HLD (hyperlipidemia)   Type 2 diabetes mellitus (HCC)  Principal Problem:   Cellulitis of right lower extremity ruled in Tmax of 100.2 earlier this admit, now afebrile Marked LE edema bilaterally has improved with lasix Was on cefepime with vanc, now transition to ancef Analgesics as needed. Pain still poorly controlled. Cont with IV dilaudid PRN Suspect underlying neuropathy. Have added lyrica. Pt has a documented reaction to lyrica, however patient reports he has tolerated in past and would like to try lyrica now BLE CT reviewed with patient. Findings of celluliits w/o florid osteomyelitis and no evidence of necrotizing faciitis  Given open wound, appreciate WOC input   Active Problems:   Type 2 diabetes Mellitus with neuropathy Carbohydrate modified diet. Check hemoglobin A1c. CBG monitoring  with RI SS although pt has been refusing finger sticks Cont to recommend follow-up with PCP as an outpatient when discharged See above, patient is complaining of marked R LE pain described as burning Patient reports being on lyrica before, wishes to try again     Hypertension On carvedilol 3.125 mg p.o. twice daily. All also on Entresto twice daily. BP had been intermittently labile    Acute on Chronic combined systolic and diastolic HF Continue carvedilol as above. Continue Entresto twice daily. Most recent EF of <20% with grade 2 diastolic dysfunction Presented with marked BLE edema with blistering Edema has improved markedly with IV lasix. BUN has risen to 26 and pt reporting decreased urine output, thus will hold further lasix     CAD (coronary artery disease) Allergic to aspirin. Cannot for clopidogrel. Still smoking and not taking statin. Continue carvedilol 3.125 mg p.o. twice daily. Pt with recurrent tox screens pos for cocaine. Patient will absolutely need cessation and for pt to abstain from illicit substances     Bipolar I disorder (HCC) Continue Zyprexa 5 mg p.o. bedtime.     Homelessness TOC consulted     HLD (hyperlipidemia) No longer on statin. Follow-up with primary care provider after discharge   DVT prophylaxis: Lovenox subq Code Status: Full Family Communication: Pt in room, family not at bedside  Status is: Inpatient  Requires continued inpatient status because: Severity of illness    Consultants:    Procedures:    Antimicrobials: Anti-infectives (From admission, onward)    Start     Dose/Rate Route Frequency Ordered Stop   11/19/21 1400  ceFAZolin (ANCEF) IVPB 2g/100 mL premix  2 g 200 mL/hr over 30 Minutes Intravenous Every 8 hours 11/19/21 1217     11/17/21 0400  vancomycin (VANCOREADY) IVPB 1250 mg/250 mL  Status:  Discontinued        1,250 mg 166.7 mL/hr over 90 Minutes Intravenous Every 12 hours 11/16/21 1422 11/19/21 1217    11/16/21 2200  ceFEPIme (MAXIPIME) 2 g in sodium chloride 0.9 % 100 mL IVPB  Status:  Discontinued        2 g 200 mL/hr over 30 Minutes Intravenous Every 8 hours 11/16/21 1413 11/19/21 1217   11/16/21 1430  vancomycin (VANCOREADY) IVPB 2000 mg/400 mL        2,000 mg 200 mL/hr over 120 Minutes Intravenous  Once 11/16/21 1341 11/17/21 0840   11/16/21 1430  ceFEPIme (MAXIPIME) 2 g in sodium chloride 0.9 % 100 mL IVPB        2 g 200 mL/hr over 30 Minutes Intravenous  Once 11/16/21 1341 11/17/21 0840   11/15/21 2000  ceFAZolin (ANCEF) IVPB 1 g/50 mL premix  Status:  Discontinued        1 g 100 mL/hr over 30 Minutes Intravenous Every 8 hours 11/15/21 1851 11/16/21 1341   11/13/21 1500  ceFAZolin (ANCEF) IVPB 1 g/50 mL premix  Status:  Discontinued        1 g 100 mL/hr over 30 Minutes Intravenous Every 8 hours 11/13/21 1458 11/14/21 1507   11/13/21 1415  vancomycin (VANCOREADY) IVPB 1500 mg/300 mL  Status:  Discontinued        1,500 mg 150 mL/hr over 120 Minutes Intravenous  Once 11/13/21 1412 11/13/21 1413       Subjective: Still complaining of "burning" and "sharp" pain primarily in R leg  Objective: Vitals:   11/17/21 2046 11/18/21 0437 11/18/21 2129 11/19/21 0648  BP: (!) 152/97 (!) 140/92 (!) 120/92 (!) 138/103  Pulse: 82 76 96 77  Resp: 18 18 18 18   Temp:  98.4 F (36.9 C) 97.8 F (36.6 C) 97.8 F (36.6 C)  TempSrc: Oral Oral Oral Oral  SpO2: 97% 100% 100% 98%  Weight:      Height:        Intake/Output Summary (Last 24 hours) at 11/19/2021 1535 Last data filed at 11/19/2021 1440 Gross per 24 hour  Intake 1970 ml  Output 2600 ml  Net -630 ml    Filed Weights   11/13/21 1700  Weight: 90.5 kg    Examination: General exam: Conversant, in no acute distress Respiratory system: normal chest rise, clear, no audible wheezing Cardiovascular system: regular rhythm, s1-s2 Gastrointestinal system: Nondistended, nontender, pos BS Central nervous system: No seizures, no  tremors Extremities: No cyanosis, no joint deformities Skin: No rashes, no pallor, edema much improved, patches of deflated blisters over RLE, some with open areas and granulation Psychiatry: Affect normal // no auditory hallucinations   Data Reviewed: I have personally reviewed following labs and imaging studies  CBC: Recent Labs  Lab 11/13/21 1301 11/15/21 0436 11/16/21 0457 11/17/21 0449 11/18/21 0412 11/19/21 1213  WBC 8.7 9.2 6.8 5.4 5.1 5.2  NEUTROABS 5.5  --   --   --   --   --   HGB 14.9 12.1* 12.7* 12.8* 13.4 14.2  HCT 44.1 35.6* 37.6* 37.5* 39.8 42.3  MCV 95.9 95.4 94.9 94.5 94.8 93.8  PLT 283 254 299 328 341 [id number]    Basic Metabolic Panel: Recent Labs  Lab 11/15/21 0436 11/16/21 0457 11/17/21 0449 11/18/21 0412 11/19/21 1213  NA 137  136 136 136 134*  K 3.6 3.7 3.9 4.1 3.9  CL 100 100 103 102 100  CO2 25 26 24 25 22   GLUCOSE 105* 132* 103* 113* 118*  BUN 13 17 19  26* 26*  CREATININE 1.01 1.06 1.03 1.05 1.06  CALCIUM 8.5* 8.7* 8.6* 9.3 9.3    GFR: Estimated Creatinine Clearance: 89.6 mL/min (by C-G formula based on SCr of 1.06 mg/dL). Liver Function Tests: Recent Labs  Lab 11/15/21 0436 11/16/21 0457 11/17/21 0449 11/18/21 0412 11/19/21 1213  AST 14* 16 14* 15 18  ALT 12 13 13 15 14   ALKPHOS 50 50 45 50 53  BILITOT 0.5 0.5 0.5 0.5 0.5  PROT 6.6 7.2 7.2 7.7 8.4*  ALBUMIN 2.9* 3.0* 3.0* 3.3* 3.5    No results for input(s): LIPASE, AMYLASE in the last 168 hours. No results for input(s): AMMONIA in the last 168 hours. Coagulation Profile: Recent Labs  Lab 11/13/21 1301  INR 1.1    Cardiac Enzymes: No results for input(s): CKTOTAL, CKMB, CKMBINDEX, TROPONINI in the last 168 hours. BNP (last 3 results) No results for input(s): PROBNP in the last 8760 hours. HbA1C: No results for input(s): HGBA1C in the last 72 hours.  CBG: Recent Labs  Lab 11/17/21 2050 11/18/21 0716 11/18/21 1637 11/18/21 2142 11/19/21 1146  GLUCAP 166* 101* 128*  154* 102*    Lipid Profile: No results for input(s): CHOL, HDL, LDLCALC, TRIG, CHOLHDL, LDLDIRECT in the last 72 hours. Thyroid Function Tests: No results for input(s): TSH, T4TOTAL, FREET4, T3FREE, THYROIDAB in the last 72 hours. Anemia Panel: No results for input(s): VITAMINB12, FOLATE, FERRITIN, TIBC, IRON, RETICCTPCT in the last 72 hours. Sepsis Labs: Recent Labs  Lab 11/13/21 1301 11/13/21 1501  LATICACIDVEN 1.5 1.5     Recent Results (from the past 240 hour(s))  Culture, blood (Routine x 2)     Status: None   Collection Time: 11/13/21  1:01 PM   Specimen: BLOOD  Result Value Ref Range Status   Specimen Description   Final    BLOOD BLOOD LEFT HAND Performed at [facility], [place] [address] [place], [place] [address]    Special Requests   Final    BOTTLES DRAWN AEROBIC AND ANAEROBIC Blood Culture results may not be optimal due to an excessive volume of blood received in culture bottles Performed at [place] [address] [place], [place] [address]    Culture   Final    NO GROWTH 5 DAYS Performed at [facility], [place] [address] [place], [place] [address]    Report Status 11/18/2021 FINAL  Final  Culture, blood (Routine x 2)     Status: None   Collection Time: 11/13/21  1:06 PM   Specimen: BLOOD  Result Value Ref Range Status   Specimen Description   Final    BLOOD LEFT ANTECUBITAL Performed at [place] [place] [address], [place], [place] [address]    Special Requests   Final    BOTTLES DRAWN AEROBIC AND ANAEROBIC Blood Culture adequate volume Performed at [place] [address] [place], [place] [address]    Culture   Final    NO GROWTH 5 DAYS Performed at [facility], [place] [address] [place], [place] [address]    Report Status 11/18/2021 FINAL  Final      Radiology Studies: No results found.  Scheduled Meds:  bacitracin   Topical BID   carbamide  peroxide  5 drop Right EAR BID   carvedilol  3.125 mg Oral BID   enoxaparin (LOVENOX) injection  40 mg Subcutaneous Q24H   feeding supplement  237 mL Oral BID BM   hydrocerin   Topical Daily   insulin aspart  0-15 Units Subcutaneous TID WC   multivitamin with minerals  1 tablet Oral Daily   nutrition supplement (JUVEN)  1 packet Oral BID BM   OLANZapine  5 mg Oral QHS   pregabalin  100 mg Oral Daily   sacubitril-valsartan  1 tablet Oral BID   spironolactone  12.5 mg Oral Daily   Continuous Infusions:   ceFAZolin (ANCEF) IV 2 g (11/19/21 1440)      LOS: 5 days   Marylu Lund, MD [facility] Pager On Amion  If 7PM-7AM, please contact night-coverage 11/19/2021, 3:35 PM

## 2021-11-19 NOTE — Progress Notes (Signed)
Patient refused labs this morning. Patient agreed to have labs collected later this morning. Informed the lab to return to draw morning labs around 0800.

## 2021-11-19 NOTE — TOC Progression Note (Signed)
Transition of Care [facility]) - Progression Note    Patient Details  Name: Carsen Leaf MRN: [id number] Date of Birth: November 02, 1966  Transition of Care [facility]) CM/SW Contact  Amada Jupiter, [name] Phone Number: 11/19/2021, 1:37 PM  Clinical Narrative:    Per MD, pt will likely require a few more days here so have taken care of changing his PCP appointment to 12/16/21 at 11:10am and updated on AVS.   Expected Discharge Plan: Homeless Shelter Barriers to Discharge: Continued Medical Work up  Expected Discharge Plan and Services Expected Discharge Plan: Homeless Shelter   Discharge Planning Services: CM Consult   Living arrangements for the past 2 months: Homeless Shelter                                       Social Determinants of Health (SDOH) Interventions    Readmission Risk Interventions Readmission Risk Prevention Plan 03/05/2021 01/07/2021 12/10/2020  Transportation Screening Complete Complete Complete  PCP or Specialist Appt within 3-5 Days - Complete -  HRI or Home Care Consult - Complete -  Social Work Consult for Recovery Care Planning/Counseling - Complete -  Palliative Care Screening - Complete -  Medication Review Oceanographer) Complete Complete Referral to Pharmacy  PCP or Specialist appointment within 3-5 days of discharge Complete - Complete  HRI or Home Care Consult Complete - Complete  SW Recovery Care/Counseling Consult Complete - Complete  Palliative Care Screening Not Applicable - Not Applicable  Skilled Nursing Facility Complete - Not Applicable  Some recent data might be hidden

## 2021-11-20 DIAGNOSIS — E1140 Type 2 diabetes mellitus with diabetic neuropathy, unspecified: Secondary | ICD-10-CM

## 2021-11-20 DIAGNOSIS — I5043 Acute on chronic combined systolic (congestive) and diastolic (congestive) heart failure: Secondary | ICD-10-CM

## 2021-11-20 DIAGNOSIS — E7800 Pure hypercholesterolemia, unspecified: Secondary | ICD-10-CM

## 2021-11-20 DIAGNOSIS — I10 Essential (primary) hypertension: Secondary | ICD-10-CM

## 2021-11-20 DIAGNOSIS — Z5900 Homelessness unspecified: Secondary | ICD-10-CM

## 2021-11-20 LAB — CBC WITH DIFFERENTIAL/PLATELET
Abs Immature Granulocytes: 0.01 10*3/uL (ref 0.00–0.07)
Basophils Absolute: 0 10*3/uL (ref 0.0–0.1)
Basophils Relative: 1 %
Eosinophils Absolute: 0.1 10*3/uL (ref 0.0–0.5)
Eosinophils Relative: 3 %
HCT: 39.3 % (ref 39.0–52.0)
Hemoglobin: 13.1 g/dL (ref 13.0–17.0)
Immature Granulocytes: 0 %
Lymphocytes Relative: 40 %
Lymphs Abs: 2 10*3/uL (ref 0.7–4.0)
MCH: 32 pg (ref 26.0–34.0)
MCHC: 33.3 g/dL (ref 30.0–36.0)
MCV: 95.9 fL (ref 80.0–100.0)
Monocytes Absolute: 0.9 10*3/uL (ref 0.1–1.0)
Monocytes Relative: 18 %
Neutro Abs: 1.9 10*3/uL (ref 1.7–7.7)
Neutrophils Relative %: 38 %
Platelets: 394 10*3/uL (ref 150–400)
RBC: 4.1 MIL/uL — ABNORMAL LOW (ref 4.22–5.81)
RDW: 13.2 % (ref 11.5–15.5)
WBC: 5 10*3/uL (ref 4.0–10.5)
nRBC: 0 % (ref 0.0–0.2)

## 2021-11-20 LAB — COMPREHENSIVE METABOLIC PANEL
ALT: 13 U/L (ref 0–44)
AST: 18 U/L (ref 15–41)
Albumin: 3.3 g/dL — ABNORMAL LOW (ref 3.5–5.0)
Alkaline Phosphatase: 53 U/L (ref 38–126)
Anion gap: 7 (ref 5–15)
BUN: 22 mg/dL — ABNORMAL HIGH (ref 6–20)
CO2: 25 mmol/L (ref 22–32)
Calcium: 8.8 mg/dL — ABNORMAL LOW (ref 8.9–10.3)
Chloride: 101 mmol/L (ref 98–111)
Creatinine, Ser: 1.18 mg/dL (ref 0.61–1.24)
GFR, Estimated: >60 mL/min (ref >60)
Glucose, Bld: 163 mg/dL — ABNORMAL HIGH (ref 70–99)
Potassium: 3.9 mmol/L (ref 3.5–5.1)
Sodium: 133 mmol/L — ABNORMAL LOW (ref 135–145)
Total Bilirubin: 0.4 mg/dL (ref 0.3–1.2)
Total Protein: 7.7 g/dL (ref 6.5–8.1)

## 2021-11-20 LAB — PROCALCITONIN: Procalcitonin: <0.1 ng/mL

## 2021-11-20 LAB — GLUCOSE, CAPILLARY
Glucose-Capillary: 120 mg/dL — ABNORMAL HIGH (ref 70–99)
Glucose-Capillary: 107 mg/dL — ABNORMAL HIGH (ref 70–99)
Glucose-Capillary: 111 mg/dL — ABNORMAL HIGH (ref 70–99)
Glucose-Capillary: 155 mg/dL — ABNORMAL HIGH (ref 70–99)

## 2021-11-20 MED ORDER — OXYCODONE HCL 5 MG PO TABS
15.0000 mg | ORAL_TABLET | ORAL | Status: DC | PRN
  Administered 2021-11-20 – 2021-11-21 (×5): 15 mg via ORAL
  Filled 2021-11-20 (×5): qty 3

## 2021-11-20 MED ORDER — CEPHALEXIN 500 MG PO CAPS
500.0000 mg | ORAL_CAPSULE | Freq: Three times a day (TID) | ORAL | Status: DC
  Administered 2021-11-20 – 2021-11-21 (×2): 500 mg via ORAL
  Filled 2021-11-20 (×2): qty 1

## 2021-11-20 MED ORDER — PREGABALIN 100 MG PO CAPS
100.0000 mg | ORAL_CAPSULE | Freq: Two times a day (BID) | ORAL | Status: DC
  Administered 2021-11-20 – 2021-11-21 (×2): 100 mg via ORAL
  Filled 2021-11-20 (×2): qty 1

## 2021-11-20 NOTE — Evaluation (Signed)
Physical Therapy Evaluation Patient Details Name: [name] MRN: [id number] DOB: 03-04-1966 Today's Date: 11/20/2021  History of Present Illness  55 y.o. male with medical history significant of asthma, bipolar 1 disorder, depression, chronic combined systolic and diastolic CHF, COPD, GERD, homelessness, hypertension, QT prolongation, schizoaffective disorder who is coming to the emergency department due to worsening of right lower extremity cellulitis.  Clinical Impression  The patient  was participatory. Requires extra time to complete tasks. Transfers  to/from [facility] with  min guard, although nursing reports patient  transfers self  in room without support. Patient expresses interest in going to a facility.  Patient  is WC dependent. Pt admitted with above diagnosis.   Pt currently with functional limitations due to the deficits listed below (see PT Problem List). Pt will benefit from skilled PT to increase their independence and safety with mobility to allow discharge to the venue listed below.        Recommendations for follow up therapy are one component of a multi-disciplinary discharge planning process, led by the attending physician.  Recommendations may be updated based on patient status, additional functional criteria and insurance authorization.  Follow Up Recommendations Long-term institutional care without follow-up therapy    Assistance Recommended at Discharge Set up Supervision/Assistance  Functional Status Assessment Patient has not had a recent decline in their functional status  Equipment Recommendations  None recommended by PT    Recommendations for Other Services       Precautions / Restrictions Precautions Precautions: Fall Restrictions Weight Bearing Restrictions: No      Mobility  Bed Mobility Overal bed mobility: Modified Independent                  Transfers Overall transfer level: Needs assistance   Transfers: Bed to  chair/wheelchair/BSC             General transfer comment: Min assist to transfer to side of bed. Limited by pain in right foot. Min guard to return to bed. nursing reports that the patient transfers self to [facility], goes into BR and has rolled to desk.    Ambulation/Gait                  Stairs            Wheelchair Mobility    Modified Rankin (Stroke Patients Only)       Balance Overall balance assessment: Independent Sitting-balance support: No upper extremity supported Sitting balance-Leahy Scale: Good                                       Pertinent Vitals/Pain Pain Assessment: 0-10 Pain Score: 10-Worst pain ever Pain Location: R foot Pain Descriptors / Indicators: Burning Pain Intervention(s): Monitored during session;RN gave pain meds during session    Home Living Family/patient expects to be discharged to:: Shelter/Homeless                        Prior Function Prior Level of Function : Independent/Modified Independent             Mobility Comments: from wc level. says he can walk without touching things approx 5 feet but typically just transfers ADLs Comments: mod I at bed or chair level     Hand Dominance   Dominant Hand: Right    Extremity/Trunk Assessment   Upper Extremity Assessment Upper Extremity Assessment: Defer  to OT evaluation RUE Deficits / Details: WFL ROM and strength, ataxiic RUE RUE Coordination: decreased fine motor;decreased gross motor LUE Deficits / Details: WNL ROM and strength LUE Sensation: WNL LUE Coordination: WNL    Lower Extremity Assessment Lower Extremity Assessment: RLE deficits/detail;LLE deficits/detail RLE Deficits / Details: guards leg, does not bear weight, does not flex knee., drssing on lower leg., noted edema.Amp. great toe LLE Deficits / Details: grossly WFL for trnasfer, amputation gr. toe,.    Cervical / Trunk Assessment Cervical / Trunk Assessment: Normal   Communication   Communication: Expressive difficulties  Cognition Arousal/Alertness: Awake/alert Behavior During Therapy: WFL for tasks assessed/performed Overall Cognitive Status: Within Functional Limits for tasks assessed                                          General Comments      Exercises     Assessment/Plan    PT Assessment Patient needs continued PT services  PT Problem List Decreased strength;Decreased mobility;Decreased activity tolerance;Pain       PT Treatment Interventions Therapeutic activities;Functional mobility training;Patient/family education    PT Goals (Current goals can be found in the Care Plan section)  Acute Rehab PT Goals Patient Stated Goal: work with therapy, go to a facility PT Goal Formulation: With patient Time For Goal Achievement: 12/04/21 Potential to Achieve Goals: Good    Frequency Min 1X/week   Barriers to discharge        Co-evaluation PT/OT/SLP Co-Evaluation/Treatment: Yes Reason for Co-Treatment: Necessary to address cognition/behavior during functional activity           AM-PAC PT "6 Clicks" Mobility  Outcome Measure Help needed turning from your back to your side while in a flat bed without using bedrails?: None Help needed moving from lying on your back to sitting on the side of a flat bed without using bedrails?: None Help needed moving to and from a bed to a chair (including a wheelchair)?: A Little Help needed standing up from a chair using your arms (e.g., wheelchair or bedside chair)?: A Little Help needed to walk in hospital room?: Total Help needed climbing 3-5 steps with a railing? : Total 6 Click Score: 16    End of Session   Activity Tolerance: Patient limited by pain Patient left: in bed;with call bell/phone within reach Nurse Communication: Mobility status PT Visit Diagnosis: Unsteadiness on feet (R26.81);Other symptoms and signs involving the nervous system (R29.898);Pain Pain -  Right/Left: Right Pain - part of body: Ankle and joints of foot    Time: 1453-1550 PT Time Calculation (min) (ACUTE ONLY): 57 min   Charges:   PT Evaluation $PT Eval Low Complexity: 1 Low PT Treatments $Therapeutic Activity: 8-22 mins        Blanchard Kelch PT Acute Rehabilitation Services Pager [phone] Office [phone]   Rada Hay 11/20/2021, 4:44 PM

## 2021-11-20 NOTE — TOC Progression Note (Signed)
Transition of Care [facility]) - Progression Note    Patient Details  Name: [name] MRN: [id number] Date of Birth: June 16, 1966  Transition of Care [facility]) CM/SW Contact  Lennart Pall, [name] Phone Number: 11/20/2021, 12:56 PM  Clinical Narrative:    Met with pt today to follow up on information discussed by TOC over the weekend.  Explained to pt that his request for assistance with change of Medicaid back to [place] is not something that [facility] can assist with and that he needs to follow up with the local DSS office to start this process. Regarding request for motorized scooter, explained that he will not qualify for power mobility as he is able to self propel in w/c with his arms. Pt nods and affirms he understands this.  He then became adamant that he wants to "speak with the medical director." As I attempted to clarify if he was speaking of the MD assigned or the Medical director of [facility], he quickly lost temper and accused Probation officer of trying to avoid helping him and states he does NOT want to talk to "any doctor".  Unable to provide any acceptable answer to pt. TOC will continue to follow for any additional needs.     Expected Discharge Plan: Homeless Shelter Barriers to Discharge: Continued Medical Work up  Expected Discharge Plan and Services Expected Discharge Plan: Homeless Shelter   Discharge Planning Services: CM Consult   Living arrangements for the past 2 months: Homeless Shelter                                       Social Determinants of Health (SDOH) Interventions    Readmission Risk Interventions Readmission Risk Prevention Plan 03/05/2021 01/07/2021 12/10/2020  Transportation Screening Complete Complete Complete  PCP or Specialist Appt within 3-5 Days - Complete -  HRI or [place] - Complete -  Social Work Consult for [place] Planning/Counseling - Complete -  Palliative Care Screening - Complete -  Medication Review Press photographer) Complete Complete  Referral to Pharmacy  PCP or Specialist appointment within 3-5 days of discharge Complete - Complete  HRI or Home Care Consult Complete - Complete  SW Recovery Care/Counseling Consult Complete - Complete  Palliative Care Screening Not Applicable - Not Applicable  Skilled Nursing Facility Complete - Not Applicable  Some recent data might be hidden

## 2021-11-20 NOTE — Progress Notes (Signed)
Dressing was changed at 1830 per wound care instructions.

## 2021-11-20 NOTE — Evaluation (Signed)
Occupational Therapy Evaluation Patient Details Name: [name] MRN: [id number] DOB: [birth year] Today's Date: 11/20/2021   History of Present Illness 55 y.o. male with medical history significant of asthma, bipolar 1 disorder, depression, chronic combined systolic and diastolic CHF, COPD, GERD, homelessness, hypertension, QT prolongation, schizoaffective disorder who is coming to the emergency department due to worsening of right lower extremity cellulitis.   Clinical Impression   [name] is a 55 year old man who is homeless and typically able to manage himself from wheelchair level. On evaluation patient able to don pants and shirt, use urinal and perform bed mobility. He requires assistance for socks due to pain and bulkiness of bandage but demonstrated ability to doff socks at edge of bed. He was min assist to transfer to wc with therapy and min guard to return to bed. Nursing reports patient has been transferring to wc and going to bathroom on his own as well as going in to the hallway via wheelchair. Today patient reports a decline in functional status due to pain and not being able to tolerate donning shoes and did not exhibit modified independence with therapy though expect he is near his baseline. Will follow patient acutely while in hospital in order to maintain patient's current status and advance him to consistent independence. Patient reports he would like to go to a facility at discharge. Patient appears to need more custodial care and long term placement - needing consistent wound care, food and medical management. He may benefit from therapy reporting he'd like to walk again though he has been predominantly wheelchair dependent since April.      Recommendations for follow up therapy are one component of a multi-disciplinary discharge planning process, led by the attending physician.  Recommendations may be updated based on patient status, additional functional criteria and  insurance authorization.   Follow Up Recommendations  Long-term institutional care without follow-up therapy (Custodial Care)    Assistance Recommended at Discharge PRN  Functional Status Assessment  Patient has had a recent decline in their functional status and demonstrates the ability to make significant improvements in function in a reasonable and predictable amount of time.  Equipment Recommendations  None recommended by OT    Recommendations for Other Services       Precautions / Restrictions Precautions Precautions: Fall Restrictions Weight Bearing Restrictions: No      Mobility Bed Mobility Overal bed mobility: Modified Independent                  Transfers Overall transfer level: Needs assistance   Transfers: Bed to chair/wheelchair/BSC             General transfer comment: Min assist to transfer to side of bed. Limited by pain in right foot. Min guard to return to bed.      Balance Overall balance assessment: Needs assistance Sitting-balance support: No upper extremity supported Sitting balance-Leahy Scale: Good                                     ADL either performed or assessed with clinical judgement   ADL Overall ADL's : Needs assistance/impaired Eating/Feeding: Independent   Grooming: Set up   Upper Body Bathing: Set up   Lower Body Bathing: Set up   Upper Body Dressing : Independent     Lower Body Dressing Details (indicate cue type and reason): donned pants at bed level. Assistance to  don socks due to pain but he was able to doff at edge of bed without assistance. Toilet Transfer: Modified Independent;Regular Toilet;Grab bars Statistician Details (indicate cue type and reason): Nursing reports patient goes to bathroom via wc   Toileting - Clothing Manipulation Details (indicate cue type and reason): Nursing reports patient has been going to bathroom via wc by himself. Uses urinal without assistance as well      Functional mobility during ADLs: Minimal assistance;Wheelchair General ADL Comments: min assist to pivot to wc but not to return to bed.     Vision Patient Visual Report: No change from baseline       Perception     Praxis      Pertinent Vitals/Pain Pain Assessment: 0-10 Pain Score: 10-Worst pain ever Pain Location: R foot Pain Descriptors / Indicators: Burning Pain Intervention(s): Monitored during session;RN gave pain meds during session     Hand Dominance Right (has to use left hand)   Extremity/Trunk Assessment Upper Extremity Assessment Upper Extremity Assessment: RUE deficits/detail;LUE deficits/detail RUE Deficits / Details: WFL ROM and strength, ataxiic RUE RUE Coordination: decreased fine motor;decreased gross motor LUE Deficits / Details: WNL ROM and strength LUE Sensation: WNL LUE Coordination: WNL   Lower Extremity Assessment Lower Extremity Assessment: Defer to PT evaluation   Cervical / Trunk Assessment Cervical / Trunk Assessment: Normal   Communication Communication Communication: Expressive difficulties   Cognition Arousal/Alertness: Awake/alert Behavior During Therapy: WFL for tasks assessed/performed Overall Cognitive Status: Within Functional Limits for tasks assessed                                       General Comments       Exercises     Shoulder Instructions      Home Living Family/patient expects to be discharged to:: Shelter/Homeless                                        Prior Functioning/Environment Prior Level of Function : Independent/Modified Independent             Mobility Comments: from wc level. says he can walk without touching things approx 5 feet but typically just transfers ADLs Comments: mod I at bed or chair level        OT Problem List: Pain;Decreased coordination;Decreased strength;Decreased activity tolerance;Impaired balance (sitting and/or standing)      OT  Treatment/Interventions: Self-care/ADL training;Therapeutic exercise;DME and/or AE instruction;Therapeutic activities;Balance training;Patient/family education    OT Goals(Current goals can be found in the care plan section) Acute Rehab OT Goals Patient Stated Goal: to tolerate putting on shoes OT Goal Formulation: With patient Time For Goal Achievement: 12/04/21 Potential to Achieve Goals: Fair  OT Frequency: Min 1X/week   Barriers to D/C:            Co-evaluation PT/OT/SLP Co-Evaluation/Treatment: Yes Reason for Co-Treatment: Necessary to address cognition/behavior during functional activity          AM-PAC OT "6 Clicks" Daily Activity     Outcome Measure Help from another person eating meals?: None Help from another person taking care of personal grooming?: None Help from another person toileting, which includes using toliet, bedpan, or urinal?: None Help from another person bathing (including washing, rinsing, drying)?: A Little Help from another person to put on and taking off regular  upper body clothing?: None Help from another person to put on and taking off regular lower body clothing?: A Little 6 Click Score: 22   End of Session Equipment Utilized During Treatment:  (wc) Nurse Communication: Patient requests pain meds  Activity Tolerance: Patient limited by pain Patient left: in bed;with call bell/phone within reach  OT Visit Diagnosis: Pain;Hemiplegia and hemiparesis Hemiplegia - Right/Left: Right Hemiplegia - dominant/non-dominant: Dominant Hemiplegia - caused by: Cerebral infarction Pain - Right/Left: Right Pain - part of body: Ankle and joints of foot                Time: 1505-1550 OT Time Calculation (min): 45 min Charges:  OT General Charges $OT Visit: 1 Visit OT Evaluation $OT Eval Low Complexity: 1 Low  Ahren Pettinger, OTR/L Acute Care Rehab Services  Office [phone] Pager: [phone]   Kelli Churn 11/20/2021, 4:34 PM

## 2021-11-20 NOTE — Progress Notes (Signed)
PROGRESS NOTE    [name]  HWT:[id number] DOB: Feb 08, 1966 DOA: 11/13/2021 PCP: Pcp, No   Brief Narrative:  [name] PMHx schizoaffective disorder, Bipolar 1 Disorder, Depression, asthma,  chronic combined systolic and diastolic CHF, COPD, GERD, homelessness, hypertension, QT prolongation,    Coming to the emergency department due to worsening of right lower extremity cellulitis.  No new trauma to the area.  He denies fever, chills, night sweats feels malaised.  He does not know how his blood glucose have been doing.  He is not currently on diabetes medications.  No rhinorrhea, sore throat, dyspnea, productive cough, wheezing or hemoptysis.  Denied chest pain, palpitations, diaphoresis, PND, orthopnea or pitting edema of the lower extremities.  No dysuria, frequency or hematuria.  No polyuria, polydipsia, polyphagia or blurred vision.   Subjective: Afebrile overnight, A/O x4, combative.   Assessment & Plan:  Covid vaccination;  Principal Problem:   Cellulitis of right lower extremity Active Problems:   Hypertension   Acute on chronic combined systolic and diastolic CHF (congestive heart failure) (HCC)   CHF exacerbation (HCC)   CAD (coronary artery disease)   Bipolar I disorder (HCC)   Homelessness   HLD (hyperlipidemia)   Type 2 diabetes mellitus (HCC)   Cellulitis of right lower extremity ruled in -Marked LE edema bilaterally has improved with lasix -Was on cefepime with vanc, now transition to ancef -Analgesics as needed. Pain still poorly controlled. Cont with IV dilaudid PRN -12/21 discontinue IV narcotics, restart home narcotic dose - 12/21 suspect underlying neuropathy. Have added lyrica. Pt has a documented reaction to lyrica, however patient reports he has tolerated in past and would like to try lyrica now.  See DM neuropathy - BLE CT reviewed with patient. Findings of celluliits w/o florid osteomyelitis and no evidence of necrotizing faciitis  -Wound care per  WOC  -12/21 obtain procalcitonin level.  Appears patient has been on 7 days of antibiotics.  We will use procalcitonin to determine if discontinuation antibiotics warranted.  Patient afebrile today.  Type 2 diabetes Mellitus with neuropathy -12/14 hemoglobin A1c= 5.8 -12/21 increase Lyrica 100 mg BID -Carbohydrate modified diet.  Acute on chronic systolic and diastolic CHF - Coreg 3.125 mg BID -Entresto 24-26 mg 1 tablet BID - Spironolactone 12.5 mg daily -Most recent EF<20% with grade 2 diastolic dysfunction -Strict in and out - Daily weight  Essential HTN -See CHF   CAD (coronary artery disease) -Allergic to aspirin. -Not on Clopidogrel.  Substance abuse - 12/3 urine tox screen positive cocaine -12/21 consult [name] substance abuse counseling  Nicotine abuse - Patient continues to smoke. - 12/21 consult on sequela of continuing to smoke to include DEATH.   Bipolar I disorder (HCC) Continue Zyprexa 5 mg p.o. bedtime.   Homelessness -TOC consulted   HLD (hyperlipidemia) -No longer on statin. -Follow-up with primary care provider after discharge  Goals of care - 12/21 PT/OT consult evaluate for home needs    DVT prophylaxis: Lovenox Code Status: Full Family Communication:  Status is: Inpatient    Dispo: The patient is from: Home              Anticipated d/c is to: PT recommends long-term institutional care               Anticipated d/c date is: 1 day              Patient currently is medically stable to d/c.      Consultants:    Procedures/Significant Events:  I have personally reviewed and interpreted all radiology studies and my findings are as above.  VENTILATOR SETTINGS:    Cultures   Antimicrobials: Anti-infectives (From admission, onward)    Start     Ordered Stop   11/19/21 1400  ceFAZolin (ANCEF) IVPB 2g/100 mL premix        11/19/21 1217     11/17/21 0400  vancomycin (VANCOREADY) IVPB 1250 mg/250 mL  Status:  Discontinued         11/16/21 1422 11/19/21 1217   11/16/21 2200  ceFEPIme (MAXIPIME) 2 g in sodium chloride 0.9 % 100 mL IVPB  Status:  Discontinued        11/16/21 1413 11/19/21 1217   11/16/21 1430  vancomycin (VANCOREADY) IVPB 2000 mg/400 mL        11/16/21 1341 11/17/21 0840   11/16/21 1430  ceFEPIme (MAXIPIME) 2 g in sodium chloride 0.9 % 100 mL IVPB        11/16/21 1341 11/17/21 0840   11/15/21 2000  ceFAZolin (ANCEF) IVPB 1 g/50 mL premix  Status:  Discontinued        11/15/21 1851 11/16/21 1341   11/13/21 1500  ceFAZolin (ANCEF) IVPB 1 g/50 mL premix  Status:  Discontinued        11/13/21 1458 11/14/21 1507   11/13/21 1415  vancomycin (VANCOREADY) IVPB 1500 mg/300 mL  Status:  Discontinued        11/13/21 1412 11/13/21 1413         Devices    LINES / TUBES:      Continuous Infusions:   ceFAZolin (ANCEF) IV 2 g (11/20/21 1426)     Objective: Vitals:   11/19/21 0648 11/19/21 2111 11/20/21 0559 11/20/21 1415  BP: (!) 138/103 (!) 158/97 (!) 136/102 123/75  Pulse: 77 84 71 67  Resp: 18 16 18 15   Temp: 97.8 F (36.6 C) 98 F (36.7 C) 97.9 F (36.6 C)   TempSrc: Oral Oral Oral   SpO2: 98% 99% 98% 100%  Weight:      Height:        Intake/Output Summary (Last 24 hours) at 11/20/2021 1846 Last data filed at 11/20/2021 1800 Gross per 24 hour  Intake 2060 ml  Output 1600 ml  Net 460 ml   Filed Weights   11/13/21 1700  Weight: 90.5 kg    Examination:  General: A/O x4, extremely combative, No acute respiratory distress Eyes: negative scleral hemorrhage, negative anisocoria, negative icterus ENT: Negative Runny nose, negative gingival bleeding, Neck:  Negative scars, masses, torticollis, lymphadenopathy, JVD Lungs: Clear to auscultation bilaterally without wheezes or crackles Cardiovascular: Regular rate and rhythm without murmur gallop or rub normal S1 and S2 Abdomen: negative abdominal pain, nondistended, positive soft, bowel sounds, no rebound, no ascites, no  appreciable mass Extremities: RIGHT lower extremity painful to palpation, negative edema, unable to palpate PT/DP pulse.  Cellulitis appears resolved, given patient's skin tone difficult to fully appreciate. Skin: Negative rashes, lesions, ulcers Psychiatric:  Negative depression, negative anxiety, negative fatigue, negative mania, combative Central nervous system:  Cranial nerves II through XII intact, tongue/uvula midline, all extremities muscle strength 5/5, sensation intact throughout, negative dysarthria, negative expressive aphasia, negative receptive aphasia.  .     Data Reviewed: Care during the described time interval was provided by me .  I have reviewed this patient's available data, including medical history, events of note, physical examination, and all test results as part of my evaluation.   CBC: Recent Labs  Lab  11/16/21 0457 11/17/21 0449 11/18/21 0412 11/19/21 1213 11/20/21 0450  WBC 6.8 5.4 5.1 5.2 5.0  NEUTROABS  --   --   --   --  1.9  HGB 12.7* 12.8* 13.4 14.2 13.1  HCT 37.6* 37.5* 39.8 42.3 39.3  MCV 94.9 94.5 94.8 93.8 95.9  PLT 299 328 341 394 [id number]   Basic Metabolic Panel: Recent Labs  Lab 11/16/21 0457 11/17/21 0449 11/18/21 0412 11/19/21 1213 11/20/21 0450  NA 136 136 136 134* 133*  K 3.7 3.9 4.1 3.9 3.9  CL 100 103 102 100 101  CO2 26 24 25 22 25   GLUCOSE 132* 103* 113* 118* 163*  BUN 17 19 26* 26* 22*  CREATININE 1.06 1.03 1.05 1.06 1.18  CALCIUM 8.7* 8.6* 9.3 9.3 8.8*   GFR: Estimated Creatinine Clearance: 80.5 mL/min (by C-G formula based on SCr of 1.18 mg/dL). Liver Function Tests: Recent Labs  Lab 11/16/21 0457 11/17/21 0449 11/18/21 0412 11/19/21 1213 11/20/21 0450  AST 16 14* 15 18 18   ALT 13 13 15 14 13   ALKPHOS 50 45 50 53 53  BILITOT 0.5 0.5 0.5 0.5 0.4  PROT 7.2 7.2 7.7 8.4* 7.7  ALBUMIN 3.0* 3.0* 3.3* 3.5 3.3*   No results for input(s): LIPASE, AMYLASE in the last 168 hours. No results for input(s): AMMONIA in the  last 168 hours. Coagulation Profile: No results for input(s): INR, PROTIME in the last 168 hours.  Cardiac Enzymes: No results for input(s): CKTOTAL, CKMB, CKMBINDEX, TROPONINI in the last 168 hours. BNP (last 3 results) No results for input(s): PROBNP in the last 8760 hours. HbA1C: No results for input(s): HGBA1C in the last 72 hours. CBG: Recent Labs  Lab 11/19/21 1725 11/19/21 2108 11/20/21 0724 11/20/21 1118 11/20/21 1642  GLUCAP 80 198* 120* 107* 111*   Lipid Profile: No results for input(s): CHOL, HDL, LDLCALC, TRIG, CHOLHDL, LDLDIRECT in the last 72 hours. Thyroid Function Tests: No results for input(s): TSH, T4TOTAL, FREET4, T3FREE, THYROIDAB in the last 72 hours. Anemia Panel: No results for input(s): VITAMINB12, FOLATE, FERRITIN, TIBC, IRON, RETICCTPCT in the last 72 hours. Urine analysis:    Component Value Date/Time   COLORURINE YELLOW 11/13/2021 1505   APPEARANCEUR HAZY (A) 11/13/2021 1505   LABSPEC 1.025 11/13/2021 1505   PHURINE 6.0 11/13/2021 1505   GLUCOSEU NEGATIVE 11/13/2021 1505   HGBUR NEGATIVE 11/13/2021 1505   BILIRUBINUR SMALL (A) 11/13/2021 1505   KETONESUR 15 (A) 11/13/2021 1505   PROTEINUR TRACE (A) 11/13/2021 1505   NITRITE NEGATIVE 11/13/2021 1505   LEUKOCYTESUR NEGATIVE 11/13/2021 1505   Sepsis Labs: @LABRCNTIP (procalcitonin:4,lacticidven:4)  ) Recent Results (from the past 240 hour(s))  Culture, blood (Routine x 2)     Status: None   Collection Time: 11/13/21  1:01 PM   Specimen: BLOOD  Result Value Ref Range Status   Specimen Description   Final    BLOOD BLOOD LEFT HAND Performed at [facility] [address], [place], [place] [address]    Special Requests   Final    BOTTLES DRAWN AEROBIC AND ANAEROBIC Blood Culture results may not be optimal due to an excessive volume of blood received in culture bottles Performed at [facility] [address], [place], [place] [address]    Culture    Final    NO GROWTH 5 DAYS Performed at [facility], [place] [address], [place], [place] [address]    Report Status 11/18/2021 FINAL  Final  Culture, blood (  Routine x 2)     Status: None   Collection Time: 11/13/21  1:06 PM   Specimen: BLOOD  Result Value Ref Range Status   Specimen Description   Final    BLOOD LEFT ANTECUBITAL Performed at [facility], [address], [place], [name] [address]    Special Requests   Final    BOTTLES DRAWN AEROBIC AND ANAEROBIC Blood Culture adequate volume Performed at [facility], [address], [place], [name] [address]    Culture   Final    NO GROWTH 5 DAYS Performed at [facility], [address], [place], [name] [address]    Report Status 11/18/2021 FINAL  Final         Radiology Studies: No results found.      Scheduled Meds:  bacitracin   Topical BID   carbamide peroxide  5 drop Right EAR BID   carvedilol  3.125 mg Oral BID   enoxaparin (LOVENOX) injection  40 mg Subcutaneous Q24H   feeding supplement  237 mL Oral BID BM   hydrocerin   Topical Daily   insulin aspart  0-15 Units Subcutaneous TID WC   multivitamin with minerals  1 tablet Oral Daily   nutrition supplement (JUVEN)  1 packet Oral BID BM   OLANZapine  5 mg Oral QHS   pregabalin  100 mg Oral BID   sacubitril-valsartan  1 tablet Oral BID   spironolactone  12.5 mg Oral Daily   Continuous Infusions:   ceFAZolin (ANCEF) IV 2 g (11/20/21 1426)     LOS: 6 days   The patient is critically ill with multiple organ systems failure and requires high complexity decision making for assessment and support, frequent evaluation and titration of therapies, application of advanced monitoring technologies and extensive interpretation of multiple databases. Critical Care Time devoted to patient care services described in this note  Time spent: 40 minutes     [name], Roselind Messier, MD [facility]   If 7PM-7AM, please  contact night-coverage 11/20/2021, 6:46 PM

## 2021-11-21 ENCOUNTER — Inpatient Hospital Stay (INDEPENDENT_AMBULATORY_CARE_PROVIDER_SITE_OTHER): Payer: Medicaid - Out of State | Admitting: Primary Care

## 2021-11-21 LAB — COMPREHENSIVE METABOLIC PANEL
ALT: 13 U/L (ref 0–44)
AST: 16 U/L (ref 15–41)
Albumin: 3.5 g/dL (ref 3.5–5.0)
Alkaline Phosphatase: 56 U/L (ref 38–126)
Anion gap: 8 (ref 5–15)
BUN: 16 mg/dL (ref 6–20)
CO2: 26 mmol/L (ref 22–32)
Calcium: 9.2 mg/dL (ref 8.9–10.3)
Chloride: 102 mmol/L (ref 98–111)
Creatinine, Ser: 0.92 mg/dL (ref 0.61–1.24)
GFR, Estimated: >60 mL/min (ref >60)
Glucose, Bld: 105 mg/dL — ABNORMAL HIGH (ref 70–99)
Potassium: 4.1 mmol/L (ref 3.5–5.1)
Sodium: 136 mmol/L (ref 135–145)
Total Bilirubin: 0.4 mg/dL (ref 0.3–1.2)
Total Protein: 7.8 g/dL (ref 6.5–8.1)

## 2021-11-21 LAB — CBC WITH DIFFERENTIAL/PLATELET
Abs Immature Granulocytes: 0 10*3/uL (ref 0.00–0.07)
Basophils Absolute: 0 10*3/uL (ref 0.0–0.1)
Basophils Relative: 1 %
Eosinophils Absolute: 0.1 10*3/uL (ref 0.0–0.5)
Eosinophils Relative: 2 %
HCT: 39.8 % (ref 39.0–52.0)
Hemoglobin: 13.5 g/dL (ref 13.0–17.0)
Immature Granulocytes: 0 %
Lymphocytes Relative: 48 %
Lymphs Abs: 2.2 10*3/uL (ref 0.7–4.0)
MCH: 32.8 pg (ref 26.0–34.0)
MCHC: 33.9 g/dL (ref 30.0–36.0)
MCV: 96.6 fL (ref 80.0–100.0)
Monocytes Absolute: 0.7 10*3/uL (ref 0.1–1.0)
Monocytes Relative: 16 %
Neutro Abs: 1.6 10*3/uL — ABNORMAL LOW (ref 1.7–7.7)
Neutrophils Relative %: 33 %
Platelets: 368 10*3/uL (ref 150–400)
RBC: 4.12 MIL/uL — ABNORMAL LOW (ref 4.22–5.81)
RDW: 13.3 % (ref 11.5–15.5)
WBC: 4.6 10*3/uL (ref 4.0–10.5)
nRBC: 0 % (ref 0.0–0.2)

## 2021-11-21 LAB — GLUCOSE, CAPILLARY
Glucose-Capillary: 106 mg/dL — ABNORMAL HIGH (ref 70–99)
Glucose-Capillary: 112 mg/dL — ABNORMAL HIGH (ref 70–99)

## 2021-11-21 LAB — MAGNESIUM: Magnesium: 2.2 mg/dL (ref 1.7–2.4)

## 2021-11-21 LAB — PROCALCITONIN: Procalcitonin: <0.1 ng/mL

## 2021-11-21 LAB — PHOSPHORUS: Phosphorus: 3.8 mg/dL (ref 2.5–4.6)

## 2021-11-21 MED ORDER — BACITRACIN ZINC 500 UNIT/GM EX OINT: TOPICAL_OINTMENT | Freq: Two times a day (BID) | CUTANEOUS | 0 refills | Status: DC

## 2021-11-21 MED ORDER — HYDROCERIN EX CREA: 1.0000 "application " | TOPICAL_CREAM | Freq: Every day | CUTANEOUS | 0 refills | Status: DC

## 2021-11-21 MED ORDER — DIPHENHYDRAMINE-ZINC ACETATE 2-0.1 % EX CREA: TOPICAL_CREAM | Freq: Three times a day (TID) | CUTANEOUS | 0 refills | Status: DC | PRN

## 2021-11-21 NOTE — TOC Transition Note (Signed)
Transition of Care [facility]) - CM/SW Discharge Note   Patient Details  Name: [name] MRN: [id number] Date of Birth: [birth year]  [facility] of [facility]) CM/SW Contact:  Coralyn Helling, [name] Phone Number: 11/21/2021, 10:47 AM   Clinical Narrative:    Ready for discharge. Patient is homeless. PCP arranged. Appointment on AVS.    Final next level of care: Home/Self Care Barriers to Discharge: No Barriers Identified   Patient Goals and CMS Choice     Choice offered to / list presented to : NA  Discharge Placement                       Discharge Plan and Services   Discharge Planning Services: CM Consult            DME Arranged: N/A DME Agency: NA       HH Arranged: NA HH Agency: NA        Social Determinants of Health (SDOH) Interventions     Readmission Risk Interventions Readmission Risk Prevention Plan 03/05/2021 01/07/2021 12/10/2020  Transportation Screening Complete Complete Complete  PCP or Specialist Appt within 3-5 Days - Complete -  HRI or Home Care Consult - Complete -  Social Work Consult for Recovery Care Planning/Counseling - Complete -  Palliative Care Screening - Complete -  Medication Review Oceanographer) Complete Complete Referral to Pharmacy  PCP or Specialist appointment within 3-5 days of discharge Complete - Complete  HRI or Home Care Consult Complete - Complete  SW Recovery Care/Counseling Consult Complete - Complete  Palliative Care Screening Not Applicable - Not Applicable  Skilled Nursing Facility Complete - Not Applicable  Some recent data might be hidden

## 2021-11-21 NOTE — Discharge Summary (Signed)
Physician Discharge Summary  [name] [id number] DOB: 12-09-1965 DOA: 11/13/2021  PCP: Pcp, No  Admit date: 11/13/2021 Discharge date: 11/21/2021  Time spent: 35 minutes  Recommendations for Outpatient Follow-up:   Cellulitis of right lower extremity ruled in -Marked LE edema bilaterally has improved with lasix -12/21 discontinue IV narcotics, restart home narcotic dose - 12/21 suspect underlying neuropathy. Have added lyrica. Pt has a documented reaction to lyrica, however patient reports he has tolerated in past and would like to try lyrica now.  See DM neuropathy - BLE CT reviewed with patient. Findings of celluliits w/o florid osteomyelitis and no evidence of necrotizing faciitis  -Wound care per [facility]  - Complete course of antibiotics. -11/22 follow-up on 6 January@ 1110 [place]. clinic.  RLE cellulitis, wound care   Type 2 diabetes Mellitus with neuropathy -12/14 hemoglobin A1c= 5.8 -12/22 increase Lyrica 100 mg TID home dose for discharge -Carbohydrate modified diet.   Acute on chronic systolic and diastolic CHF - Coreg [id number] mg BID -Entresto 24-26 mg 1 tablet BID - Spironolactone 12.5 mg daily -Most recent EF<20% with grade 2 diastolic dysfunction   Essential HTN -See CHF   CAD (coronary artery disease) -Allergic to aspirin. -Not on Clopidogrel.   Substance abuse - 12/3 urine tox screen positive cocaine -12/21 consult [name] substance abuse counseling   Nicotine abuse - Patient continues to smoke. - 12/21 consult on sequela of continuing to smoke to include DEATH.   Bipolar I disorder (HCC) Continue Zyprexa 5 mg p.o. bedtime.   Homelessness -TOC consulted   HLD (hyperlipidemia) -No longer on statin. -Follow-up with primary care provider after discharge   Discharge Diagnoses:  Principal Problem:   Cellulitis of right lower extremity Active Problems:   Hypertension   Acute on chronic combined systolic and  diastolic CHF (congestive heart failure) (HCC)   CHF exacerbation (HCC)   CAD (coronary artery disease)   Bipolar I disorder ([place])   Homelessness   HLD (hyperlipidemia)   Type 2 diabetes mellitus ([place])   Discharge Condition: Stable  Diet recommendation: Heart healthy/carb modified  Filed Weights   11/13/21 1700  Weight: 90.5 kg    History of present illness:  [name] PMHx schizoaffective disorder, Bipolar 1 Disorder, Depression, asthma,  chronic combined systolic and diastolic CHF, COPD, GERD, homelessness, hypertension, QT prolongation,     Coming to the emergency department due to worsening of right lower extremity cellulitis.  No new trauma to the area.  He denies fever, chills, night sweats feels malaised.  He does not know how his blood glucose have been doing.  He is not currently on diabetes medications.  No rhinorrhea, sore throat, dyspnea, productive cough, wheezing or hemoptysis.  Denied chest pain, palpitations, diaphoresis, PND, orthopnea or pitting edema of the lower extremities.  No dysuria, frequency or hematuria.  No polyuria, polydipsia, polyphagia or blurred vision.  Hospital Course:  See above   Antibiotics Anti-infectives (From admission, onward)    Start     Ordered Stop   11/20/21 2200  cephALEXin (KEFLEX) capsule 500 mg  Status:  Discontinued        11/20/21 1905 11/21/21 1743   11/19/21 1400  ceFAZolin (ANCEF) IVPB 2g/100 mL premix  Status:  Discontinued        11/19/21 1217 11/20/21 1905   11/17/21 0400  vancomycin (VANCOREADY) IVPB 1250 mg/250 mL  Status:  Discontinued        11/16/21 1422 11/19/21 1217   11/16/21 2200  ceFEPIme (MAXIPIME) 2 g in sodium chloride 0.9 % 100 mL IVPB  Status:  Discontinued        11/16/21 1413 11/19/21 1217   11/16/21 1430  vancomycin (VANCOREADY) IVPB 2000 mg/400 mL        11/16/21 1341 11/17/21 0840   11/16/21 1430  ceFEPIme (MAXIPIME) 2 g in sodium chloride 0.9 % 100 mL IVPB        11/16/21 1341 11/17/21 0840    11/15/21 2000  ceFAZolin (ANCEF) IVPB 1 g/50 mL premix  Status:  Discontinued        11/15/21 1851 11/16/21 1341   11/13/21 1500  ceFAZolin (ANCEF) IVPB 1 g/50 mL premix  Status:  Discontinued        11/13/21 1458 11/14/21 1507   11/13/21 1415  vancomycin (VANCOREADY) IVPB 1500 mg/300 mL  Status:  Discontinued        11/13/21 1412 11/13/21 1413         Discharge Exam: Vitals:   11/20/21 0559 11/20/21 1415 11/20/21 2136 11/21/21 0640  BP: (!) 136/102 123/75 134/86 (!) 168/92  Pulse: 71 67 76 66  Resp: 18 15 18 18   Temp: 97.9 F (36.6 C)  98.2 F (36.8 C) 98.4 F (36.9 C)  TempSrc: Oral  Oral Oral  SpO2: 98% 100% 99% 99%  Weight:      Height:       General: A/O x4, extremely combative, No acute respiratory distress Eyes: negative scleral hemorrhage, negative anisocoria, negative icterus ENT: Negative Runny nose, negative gingival bleeding, Neck:  Negative scars, masses, torticollis, lymphadenopathy, JVD Lungs: Clear to auscultation bilaterally without wheezes or crackles Cardiovascular: Regular rate and rhythm without murmur gallop or rub normal S1 and S2  Discharge Instructions   Allergies as of 11/21/2021       Reactions   Ibuprofen Anaphylaxis   Acetaminophen Hives, Nausea And Vomiting, Rash   Pt overdosed on bottle of APAP 05/06/16   Lyrica Cr [pregabalin Er] Other (See Comments)   Dystonia and dysarthria   Peanut-containing Drug Products Other (See Comments)   Pt denies allergy to peanut butter 03/02/2021        Medication List     STOP taking these medications    furosemide 40 MG tablet Commonly known as: LASIX   traZODone 50 MG tablet Commonly known as: DESYREL       TAKE these medications    albuterol 108 (90 Base) MCG/ACT inhaler Commonly known as: VENTOLIN HFA Inhale 2 puffs into the lungs every 6 (six) hours as needed for wheezing or shortness of breath.   atorvastatin 10 MG tablet Commonly known as: LIPITOR Take 4 tablets (40 mg total) by  mouth daily.   bacitracin ointment Apply topically 2 (two) times daily.   carbamide peroxide 6.5 % OTIC solution Commonly known as: DEBROX Place 5 drops into both ears twice daily.   carvedilol 3.125 MG tablet Commonly known as: COREG Take 1 tablet (3.125 mg total) by mouth 2 (two) times daily with a meal.   diphenhydrAMINE-zinc acetate cream Commonly known as: BENADRYL Apply topically 3 (three) times daily as needed for itching. Apply to affected area   Entresto 24-26 MG Generic drug: sacubitril-valsartan Take 1 tablet by mouth 2 (two) times daily.   hydrocerin Crea Apply 1 application topically daily. Start taking on: November 22, 2021   Incruse Ellipta 62.5 MCG/ACT Aepb Generic drug: umeclidinium bromide Inhale 1 puff into the lungs daily.   levETIRAcetam 500 MG tablet Commonly known as: KEPPRA  Take 1 tablet (500 mg total) by mouth 2 (two) times daily.   OLANZapine 5 MG tablet Commonly known as: ZyPREXA Take 1 tablet (5 mg total) by mouth at bedtime.   oxyCODONE 15 MG immediate release tablet Commonly known as: ROXICODONE Take 15 mg by mouth every 4 (four) hours as needed for pain.   pregabalin 100 MG capsule Commonly known as: LYRICA Take 100 mg by mouth 3 (three) times daily.   spironolactone 25 MG tablet Commonly known as: ALDACTONE Take 1/2 tablets (12.5 mg total) by mouth daily.   traMADol 50 MG tablet Commonly known as: ULTRAM Take 1 tablet (50 mg total) by mouth every 12 (twelve) hours as needed for up to 10 doses.       Allergies  Allergen Reactions   Ibuprofen Anaphylaxis   Acetaminophen Hives, Nausea And Vomiting and Rash    Pt overdosed on bottle of APAP 05/06/16    Lyrica Cr [Pregabalin Er] Other (See Comments)    Dystonia and dysarthria   Peanut-Containing Drug Products Other (See Comments)    Pt denies allergy to peanut butter 03/02/2021    Follow-up Information     [facility]. Go on 12/06/2021.    Why: Your appointment is December 06, 2021 at 11:10am. Contact information: [address] [place] [address] [phone]                 The results of significant diagnostics from this hospitalization (including imaging, microbiology, ancillary and laboratory) are listed below for reference.    Significant Diagnostic Studies: DG Chest 2 View  Result Date: 11/13/2021 CLINICAL DATA:  Suspected sepsis EXAM: CHEST - 2 VIEW COMPARISON:  11/02/2021 FINDINGS: Apparent shift of mediastinum to the right may be due to rotation. Cardiac size is within normal limits. There are no signs of pulmonary edema or new focal infiltrates. There is no pleural effusion or pneumothorax. IMPRESSION: No active cardiopulmonary disease. Electronically Signed   By: Ernie Avena M.D.   On: 11/13/2021 13:22   CT TIBIA FIBULA LEFT W CONTRAST  Result Date: 11/16/2021 CLINICAL DATA:  Insert his EXAM: CT OF THE LOWER LEFT EXTREMITY WITH CONTRAST CT OF THE LOWER LEFT FOOT WITH CONTRAST TECHNIQUE: Multidetector CT imaging of the lower left extremity was performed according to the standard protocol following intravenous contrast administration. CONTRAST:  OMNIPAQUE IOHEXOL 350 MG/ML SOLN COMPARISON:  Left foot radiograph 11/15/2021 FINDINGS: Bones/Joint/Cartilage Prior great toe amputation. There is no frank bony destruction in the left foot. There is mild hindfoot osteoarthritis. Dorsal midfoot spurring. Plantar calcaneal spurring. There is no acute osseous abnormality involving the tibia or fibula. 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 There is chronic fragmentation of the tibial tuberosity likely sequela of prior Osgood-Schlatter. Ligaments Suboptimally assessed by CT. Muscles and Tendons No muscle atrophy.  No intramuscular collection. Soft tissues Soft tissue swelling of the foot superficially into a lesser degree along the lower leg with skin thickening. No  well-defined fluid collection on CT. No soft tissue gas. IMPRESSION: Skin thickening and superficial soft tissue swelling of the foot and to a lesser degree the lower leg, as can be seen in cellulitis or lymphedema. Prior great toe amputation with adjacent soft tissue thickening. No evidence of soft tissue abscess or bony destruction to suggest osteomyelitis on CT. MRI would be more sensitive. Electronically Signed   By: Caprice Renshaw M.D.   On: 11/16/2021 18:00   CT FOOT RIGHT W CONTRAST  Result Date: 11/16/2021 CLINICAL DATA:  Soft tissue infection suspected, lower leg, xray done; Foot swelling, diabetic, osteomyelitis suspected, xray done EXAM: CT OF THE LOWER RIGHT EXTREMITY WITH CONTRAST CT OF THE RIGHT FOOT WITH CONTRAST TECHNIQUE: Multidetector CT imaging of the lower right extremity and right foot was performed according to the standard protocol following intravenous contrast administration. CONTRAST:  OMNIPAQUE IOHEXOL 350 MG/ML SOLN COMPARISON:  Right foot radiograph 11/15/2021 FINDINGS: Bones/Joint/Cartilage Prior great toe amputation. No frank bony destruction in the foot. Mild hindfoot osteoarthritis. Os trigonum. Plantar calcaneal spur. Dorsal midfoot spurring. There is no acute osseous abnormality involving the tibia or fibula. There is chronic tibial tuberosity fragmentation likely sequela of prior Osgood-Schlatter. There is no frank bony destruction to suggest osteomyelitis on CT. There is mild medial patellofemoral predominant osteoarthritis of the knee. Ligaments Suboptimally assessed by CT. Muscles and Tendons No muscle atrophy.  No intramuscular collection. Soft tissues There is skin thickening and soft tissue swelling of the right foot and to a lesser degree of the lower leg. There is no rim enhancing fluid collection. No soft tissue gas. IMPRESSION: Skin thickening and soft tissue swelling of the right foot and to a lesser degree of the right lower leg, as can be seen in cellulitis or  lymphedema. Prior great toe amputation with adjacent soft tissue thickening. No evidence of soft tissue abscess or bony destruction to suggest osteomyelitis on CT. MRI would be more sensitive. Electronically Signed   By: Caprice Renshaw M.D.   On: 11/16/2021 18:06   CT FOOT LEFT W CONTRAST  Result Date: 11/16/2021 CLINICAL DATA:  Insert his EXAM: CT OF THE LOWER LEFT EXTREMITY WITH CONTRAST CT OF THE LOWER LEFT FOOT WITH CONTRAST TECHNIQUE: Multidetector CT imaging of the lower left extremity was performed according to the standard protocol following intravenous contrast administration. CONTRAST:  OMNIPAQUE IOHEXOL 350 MG/ML SOLN COMPARISON:  Left foot radiograph 11/15/2021 FINDINGS: Bones/Joint/Cartilage Prior great toe amputation. There is no frank bony destruction in the left foot. There is mild hindfoot osteoarthritis. Dorsal midfoot spurring. Plantar calcaneal spurring. There is no acute osseous abnormality involving the tibia or fibula. 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 There is chronic fragmentation of the tibial tuberosity likely sequela of prior Osgood-Schlatter. Ligaments Suboptimally assessed by CT. Muscles and Tendons No muscle atrophy.  No intramuscular collection. Soft tissues Soft tissue swelling of the foot superficially into a lesser degree along the lower leg with skin thickening. No well-defined fluid collection on CT. No soft tissue gas. IMPRESSION: Skin thickening and superficial soft tissue swelling of the foot and to a lesser degree the lower leg, as can be seen in cellulitis or lymphedema. Prior great toe amputation with adjacent soft tissue thickening. No evidence of soft tissue abscess or bony destruction to suggest osteomyelitis on CT. MRI would be more sensitive. Electronically Signed   By: Caprice Renshaw M.D.   On: 11/16/2021 18:00   CT TIBIA FIBULA RIGHT W CONTRAST  Result Date: 11/16/2021 CLINICAL DATA:  Soft tissue infection suspected, lower leg, xray  done; Foot swelling, diabetic, osteomyelitis suspected, xray done EXAM: CT OF THE LOWER RIGHT EXTREMITY WITH CONTRAST CT OF THE RIGHT FOOT WITH CONTRAST TECHNIQUE: Multidetector CT imaging of the lower right extremity and right foot was performed according to the standard protocol following intravenous contrast administration. CONTRAST:  OMNIPAQUE IOHEXOL 350 MG/ML SOLN COMPARISON:  Right foot radiograph 11/15/2021 FINDINGS: Bones/Joint/Cartilage Prior great toe amputation. No frank bony destruction in the foot. Mild  hindfoot osteoarthritis. Os trigonum. Plantar calcaneal spur. Dorsal midfoot spurring. There is no acute osseous abnormality involving the tibia or fibula. There is chronic tibial tuberosity fragmentation likely sequela of prior Osgood-Schlatter. There is no frank bony destruction to suggest osteomyelitis on CT. There is mild medial patellofemoral predominant osteoarthritis of the knee. Ligaments Suboptimally assessed by CT. Muscles and Tendons No muscle atrophy.  No intramuscular collection. Soft tissues There is skin thickening and soft tissue swelling of the right foot and to a lesser degree of the lower leg. There is no rim enhancing fluid collection. No soft tissue gas. IMPRESSION: Skin thickening and soft tissue swelling of the right foot and to a lesser degree of the right lower leg, as can be seen in cellulitis or lymphedema. Prior great toe amputation with adjacent soft tissue thickening. No evidence of soft tissue abscess or bony destruction to suggest osteomyelitis on CT. MRI would be more sensitive. Electronically Signed   By: Maurine Simmering M.D.   On: 11/16/2021 18:06   DG Chest Port 1 View  Result Date: 11/02/2021 CLINICAL DATA:  Shortness of breath for 1-2 months. EXAM: PORTABLE CHEST 1 VIEW COMPARISON:  March 02, 2021 FINDINGS: The heart size and mediastinal contours are within normal limits. Both lungs are clear. The visualized skeletal structures are unremarkable. IMPRESSION: No  active disease. Electronically Signed   By: Fidela Salisbury M.D.   On: 11/02/2021 17:41   DG Foot Complete Left  Result Date: 11/15/2021 CLINICAL DATA:  Diabetic foot.  Cellulitis EXAM: LEFT FOOT - COMPLETE 3+ VIEW COMPARISON:  02/19/2021 FINDINGS: AP taken of the great toe at the level of the first MTP. No change in appearance from the prior study. No fracture or osteomyelitis. Remaining toes appear negative. Soft tissue swelling of the midfoot. IMPRESSION: Negative for fracture or osteomyelitis. Amputation of the great toe. Soft tissue swelling of the midfoot. Electronically Signed   By: Franchot Gallo M.D.   On: 11/15/2021 15:05   DG Foot Complete Right  Result Date: 11/15/2021 CLINICAL DATA:  Diabetic foot.  Cellulitis EXAM: RIGHT FOOT COMPLETE - 3+ VIEW COMPARISON:  02/19/2021 FINDINGS: Amputation of the great toe at the level the first MTP. Negative for fracture or osteomyelitis. Remaining toes are negative. IMPRESSION: Negative for fracture or osteomyelitis.  Amputation great toe. Electronically Signed   By: Franchot Gallo M.D.   On: 11/15/2021 15:06   VAS [place] LOWER EXTREMITY VENOUS (DVT) (7a-7p)  Result Date: 11/10/2021  Lower Venous DVT Study Patient Name:  [name]  Date of Exam:   11/07/2021 Medical Rec #: [id number]       Accession #:    [id number] Date of Birth: Feb 16, 1966       Patient Gender: M Patient Age:   49 years Exam Location:  [facility] Procedure:      VAS [place] LOWER EXTREMITY VENOUS (DVT) Referring Phys: Madalyn Rob --------------------------------------------------------------------------------  Indications: Pain, and Edema.  Comparison Study: Previous exam 03/02/2021 was negative for DVT. Performing Technologist: Rogelia Rohrer RVT, RDMS  Examination Guidelines: A complete evaluation includes B-mode imaging, spectral Doppler, color Doppler, and power Doppler as needed of all accessible portions of each vessel. Bilateral testing is considered an integral part of a  complete examination. Limited examinations for reoccurring indications may be performed as noted. The reflux portion of the exam is performed with the patient in reverse Trendelenburg.  +---------+---------------+---------+-----------+----------+--------------+  RIGHT     Compressibility Phasicity Spontaneity Properties Thrombus Aging  +---------+---------------+---------+-----------+----------+--------------+  CFV  Full            Yes       Yes                                    +---------+---------------+---------+-----------+----------+--------------+  SFJ       Full                                                             +---------+---------------+---------+-----------+----------+--------------+  FV Prox   Full            Yes       Yes                                    +---------+---------------+---------+-----------+----------+--------------+  FV Mid    Full            Yes       Yes                                    +---------+---------------+---------+-----------+----------+--------------+  FV Distal Full            Yes       Yes                                    +---------+---------------+---------+-----------+----------+--------------+  PFV       Full                                                             +---------+---------------+---------+-----------+----------+--------------+  POP       Full            Yes       Yes                                    +---------+---------------+---------+-----------+----------+--------------+  PTV       Full                                                             +---------+---------------+---------+-----------+----------+--------------+  PERO      Full                                                             +---------+---------------+---------+-----------+----------+--------------+ Flow noted in distal porion of PTA, ATA, and PERO A.  +---------+---------------+---------+-----------+----------+--------------+  LEFT  Compressibility Phasicity Spontaneity Properties Thrombus Aging  +---------+---------------+---------+-----------+----------+--------------+  CFV       Full            Yes       Yes                                    +---------+---------------+---------+-----------+----------+--------------+  SFJ       Full                                                             +---------+---------------+---------+-----------+----------+--------------+  FV Prox   Full            Yes       Yes                                    +---------+---------------+---------+-----------+----------+--------------+  FV Mid    Full            Yes       Yes                                    +---------+---------------+---------+-----------+----------+--------------+  FV Distal Full            Yes       Yes                                    +---------+---------------+---------+-----------+----------+--------------+  PFV       Full                                                             +---------+---------------+---------+-----------+----------+--------------+  POP       Full            Yes       Yes                                    +---------+---------------+---------+-----------+----------+--------------+  PTV       Full                                                             +---------+---------------+---------+-----------+----------+--------------+  PERO      Partial                                                          +---------+---------------+---------+-----------+----------+--------------+ Flow noted in distal porion of  PTA, ATA, and PERO A.    Summary: BILATERAL: - No evidence of deep vein thrombosis seen in the lower extremities, bilaterally. - No evidence of superficial venous thrombosis in the lower extremities, bilaterally. -No evidence of popliteal cyst, bilaterally. RIGHT: - Ultrasound characteristics of enlarged lymph nodes are noted in the groin.  LEFT: - Ultrasound characteristics of enlarged lymph nodes noted  in the groin.  *See table(s) above for measurements and observations. Electronically signed by Harold Barban MD on 11/10/2021 at 7:47:17 PM.    Final     Microbiology: Recent Results (from the past 240 hour(s))  Culture, blood (Routine x 2)     Status: None   Collection Time: 11/13/21  1:01 PM   Specimen: BLOOD  Result Value Ref Range Status   Specimen Description   Final    BLOOD BLOOD LEFT HAND Performed at [facility], [place] [address] [place], [place] [address]    Special Requests   Final    BOTTLES DRAWN AEROBIC AND ANAEROBIC Blood Culture results may not be optimal due to an excessive volume of blood received in culture bottles Performed at [facility] [address], [place], [place] [address]    Culture   Final    NO GROWTH 5 DAYS Performed at [facility], [place] [address], [place], [place] [address]    Report Status 11/18/2021 FINAL  Final  Culture, blood (Routine x 2)     Status: None   Collection Time: 11/13/21  1:06 PM   Specimen: BLOOD  Result Value Ref Range Status   Specimen Description   Final    BLOOD LEFT ANTECUBITAL Performed at [facility] [address], [place], [place] [address]    Special Requests   Final    BOTTLES DRAWN AEROBIC AND ANAEROBIC Blood Culture adequate volume Performed at [facility] [address], [place], [place] [address]    Culture   Final    NO GROWTH 5 DAYS Performed at [facility], [place] [address] [place], [place] [address]    Report Status 11/18/2021 FINAL  Final     Labs: Basic Metabolic Panel: Recent Labs  Lab 11/16/21 0457 11/17/21 0449 11/18/21 0412 11/19/21 1213 11/20/21 0450  NA 136 136 136 134* 133*  K 3.7 3.9 4.1 3.9 3.9  CL 100 103 102 100 101  CO2 26 24 25 22 25   GLUCOSE 132* 103* 113* 118* 163*  BUN 17 19 26* 26* 22*  CREATININE 1.06 1.03 1.05 1.06 1.18  CALCIUM 8.7* 8.6* 9.3 9.3 8.8*   Liver Function  Tests: Recent Labs  Lab 11/16/21 0457 11/17/21 0449 11/18/21 0412 11/19/21 1213 11/20/21 0450  AST 16 14* 15 18 18   ALT 13 13 15 14 13   ALKPHOS 50 45 50 53 53  BILITOT 0.5 0.5 0.5 0.5 0.4  PROT 7.2 7.2 7.7 8.4* 7.7  ALBUMIN 3.0* 3.0* 3.3* 3.5 3.3*   No results for input(s): LIPASE, AMYLASE in the last 168 hours. No results for input(s): AMMONIA in the last 168 hours. CBC: Recent Labs  Lab 11/16/21 0457 11/17/21 0449 11/18/21 0412 11/19/21 1213 11/20/21 0450  WBC 6.8 5.4 5.1 5.2 5.0  NEUTROABS  --   --   --   --  1.9  HGB 12.7* 12.8* 13.4 14.2 13.1  HCT 37.6* 37.5* 39.8 42.3 39.3  MCV 94.9 94.5 94.8 93.8 95.9  PLT 299 328 341 394 394   Cardiac Enzymes:  No results for input(s): CKTOTAL, CKMB, CKMBINDEX, TROPONINI in the last 168 hours. BNP: BNP (last 3 results) Recent Labs    03/02/21 1530 11/02/21 1714 11/07/21 1516  BNP 424.6* 100.0 40.6    ProBNP (last 3 results) No results for input(s): PROBNP in the last 8760 hours.  CBG: Recent Labs  Lab 11/20/21 0724 11/20/21 1118 11/20/21 1642 11/20/21 2139 11/21/21 0737  GLUCAP 120* 107* 111* 155* 106*       Signed:  Dia Crawford, MD [facility]

## 2021-11-21 NOTE — Progress Notes (Signed)
Nurse reviewed discharge instructions with pt.  Pt verbalized understanding of discharge instructions, follow up appointments and new medications.  Pt given prescriptions.  [facility] called but nurse had to leave a messages as answering service said they were unavailable at this time.  Pt refused bus pass as he is in a wheel chair.  Pt informed that nurse left a message with [facility] transportation services and it may be a while before something can be arranged.  Pt determined to go downstairs to lobby to wait.  Pt rolled in his wheelchair by tech to lobby.

## 2021-11-23 ENCOUNTER — Other Ambulatory Visit: Payer: Self-pay

## 2021-11-23 ENCOUNTER — Emergency Department (HOSPITAL_COMMUNITY): Payer: Medicaid - Out of State

## 2021-11-23 ENCOUNTER — Emergency Department (HOSPITAL_COMMUNITY)
Admission: EM | Admit: 2021-11-23 | Discharge: 2021-11-23 | Disposition: A | Discharge status: Home / Self Care | Payer: Medicaid - Out of State | Attending: Emergency Medicine | Admitting: Emergency Medicine

## 2021-11-23 ENCOUNTER — Encounter (HOSPITAL_COMMUNITY): Payer: Self-pay

## 2021-11-23 DIAGNOSIS — M79605 Pain in left leg: Secondary | ICD-10-CM

## 2021-11-23 DIAGNOSIS — R000 Tachycardia, unspecified: Secondary | ICD-10-CM | POA: Insufficient documentation

## 2021-11-23 DIAGNOSIS — I110 Hypertensive heart disease with heart failure: Secondary | ICD-10-CM | POA: Diagnosis not present

## 2021-11-23 DIAGNOSIS — J45909 Unspecified asthma, uncomplicated: Secondary | ICD-10-CM | POA: Insufficient documentation

## 2021-11-23 DIAGNOSIS — Z79899 Other long term (current) drug therapy: Secondary | ICD-10-CM | POA: Diagnosis not present

## 2021-11-23 DIAGNOSIS — Z20822 Contact with and (suspected) exposure to covid-19: Secondary | ICD-10-CM | POA: Diagnosis not present

## 2021-11-23 DIAGNOSIS — R0602 Shortness of breath: Secondary | ICD-10-CM | POA: Insufficient documentation

## 2021-11-23 DIAGNOSIS — I5043 Acute on chronic combined systolic (congestive) and diastolic (congestive) heart failure: Secondary | ICD-10-CM | POA: Insufficient documentation

## 2021-11-23 DIAGNOSIS — Z91010 Allergy to peanuts: Secondary | ICD-10-CM | POA: Insufficient documentation

## 2021-11-23 DIAGNOSIS — M79662 Pain in left lower leg: Secondary | ICD-10-CM | POA: Diagnosis not present

## 2021-11-23 DIAGNOSIS — E875 Hyperkalemia: Secondary | ICD-10-CM | POA: Diagnosis not present

## 2021-11-23 DIAGNOSIS — F17210 Nicotine dependence, cigarettes, uncomplicated: Secondary | ICD-10-CM | POA: Insufficient documentation

## 2021-11-23 DIAGNOSIS — I2510 Atherosclerotic heart disease of native coronary artery without angina pectoris: Secondary | ICD-10-CM | POA: Diagnosis not present

## 2021-11-23 DIAGNOSIS — J449 Chronic obstructive pulmonary disease, unspecified: Secondary | ICD-10-CM | POA: Diagnosis not present

## 2021-11-23 DIAGNOSIS — M79604 Pain in right leg: Secondary | ICD-10-CM

## 2021-11-23 DIAGNOSIS — E1169 Type 2 diabetes mellitus with other specified complication: Secondary | ICD-10-CM | POA: Diagnosis not present

## 2021-11-23 DIAGNOSIS — M79661 Pain in right lower leg: Secondary | ICD-10-CM | POA: Diagnosis present

## 2021-11-23 LAB — COMPREHENSIVE METABOLIC PANEL
ALT: 16 U/L (ref 0–44)
AST: 21 U/L (ref 15–41)
Albumin: 4 g/dL (ref 3.5–5.0)
Alkaline Phosphatase: 60 U/L (ref 38–126)
Anion gap: 11 (ref 5–15)
BUN: 26 mg/dL — ABNORMAL HIGH (ref 6–20)
CO2: 21 mmol/L — ABNORMAL LOW (ref 22–32)
Calcium: 9.5 mg/dL (ref 8.9–10.3)
Chloride: 103 mmol/L (ref 98–111)
Creatinine, Ser: 1.08 mg/dL (ref 0.61–1.24)
GFR, Estimated: >60 mL/min (ref >60)
Glucose, Bld: 109 mg/dL — ABNORMAL HIGH (ref 70–99)
Potassium: 4.4 mmol/L (ref 3.5–5.1)
Sodium: 135 mmol/L (ref 135–145)
Total Bilirubin: 0.8 mg/dL (ref 0.3–1.2)
Total Protein: 8.2 g/dL — ABNORMAL HIGH (ref 6.5–8.1)

## 2021-11-23 LAB — CBC WITH DIFFERENTIAL/PLATELET
Abs Immature Granulocytes: 0.03 10*3/uL (ref 0.00–0.07)
Basophils Absolute: 0 10*3/uL (ref 0.0–0.1)
Basophils Relative: 0 %
Eosinophils Absolute: 0 10*3/uL (ref 0.0–0.5)
Eosinophils Relative: 0 %
HCT: 41.7 % (ref 39.0–52.0)
Hemoglobin: 14.2 g/dL (ref 13.0–17.0)
Immature Granulocytes: 0 %
Lymphocytes Relative: 32 %
Lymphs Abs: 3.4 10*3/uL (ref 0.7–4.0)
MCH: 32 pg (ref 26.0–34.0)
MCHC: 34.1 g/dL (ref 30.0–36.0)
MCV: 93.9 fL (ref 80.0–100.0)
Monocytes Absolute: 1.1 10*3/uL — ABNORMAL HIGH (ref 0.1–1.0)
Monocytes Relative: 10 %
Neutro Abs: 6.2 10*3/uL (ref 1.7–7.7)
Neutrophils Relative %: 58 %
Platelets: 450 10*3/uL — ABNORMAL HIGH (ref 150–400)
RBC: 4.44 MIL/uL (ref 4.22–5.81)
RDW: 13.4 % (ref 11.5–15.5)
WBC: 10.7 10*3/uL — ABNORMAL HIGH (ref 4.0–10.5)
nRBC: 0 % (ref 0.0–0.2)

## 2021-11-23 LAB — TROPONIN I (HIGH SENSITIVITY)
Troponin I (High Sensitivity): 28 ng/L — ABNORMAL HIGH (ref <18)
Troponin I (High Sensitivity): 29 ng/L — ABNORMAL HIGH (ref <18)

## 2021-11-23 LAB — RESP PANEL BY RT-PCR (FLU A&B, COVID) ARPGX2
Influenza A by PCR: NEGATIVE
Influenza B by PCR: NEGATIVE
SARS Coronavirus 2 by RT PCR: NEGATIVE

## 2021-11-23 LAB — BRAIN NATRIURETIC PEPTIDE: B Natriuretic Peptide: 41.3 pg/mL (ref 0.0–100.0)

## 2021-11-23 IMAGING — CR DG CHEST 1V PORT
1 series · 1 of 1 positions shown · non-contrast
Comparison: [DATE]

CLINICAL DATA: Shortness of breath

EXAM:
PORTABLE CHEST 1 VIEW

[x chest ap]
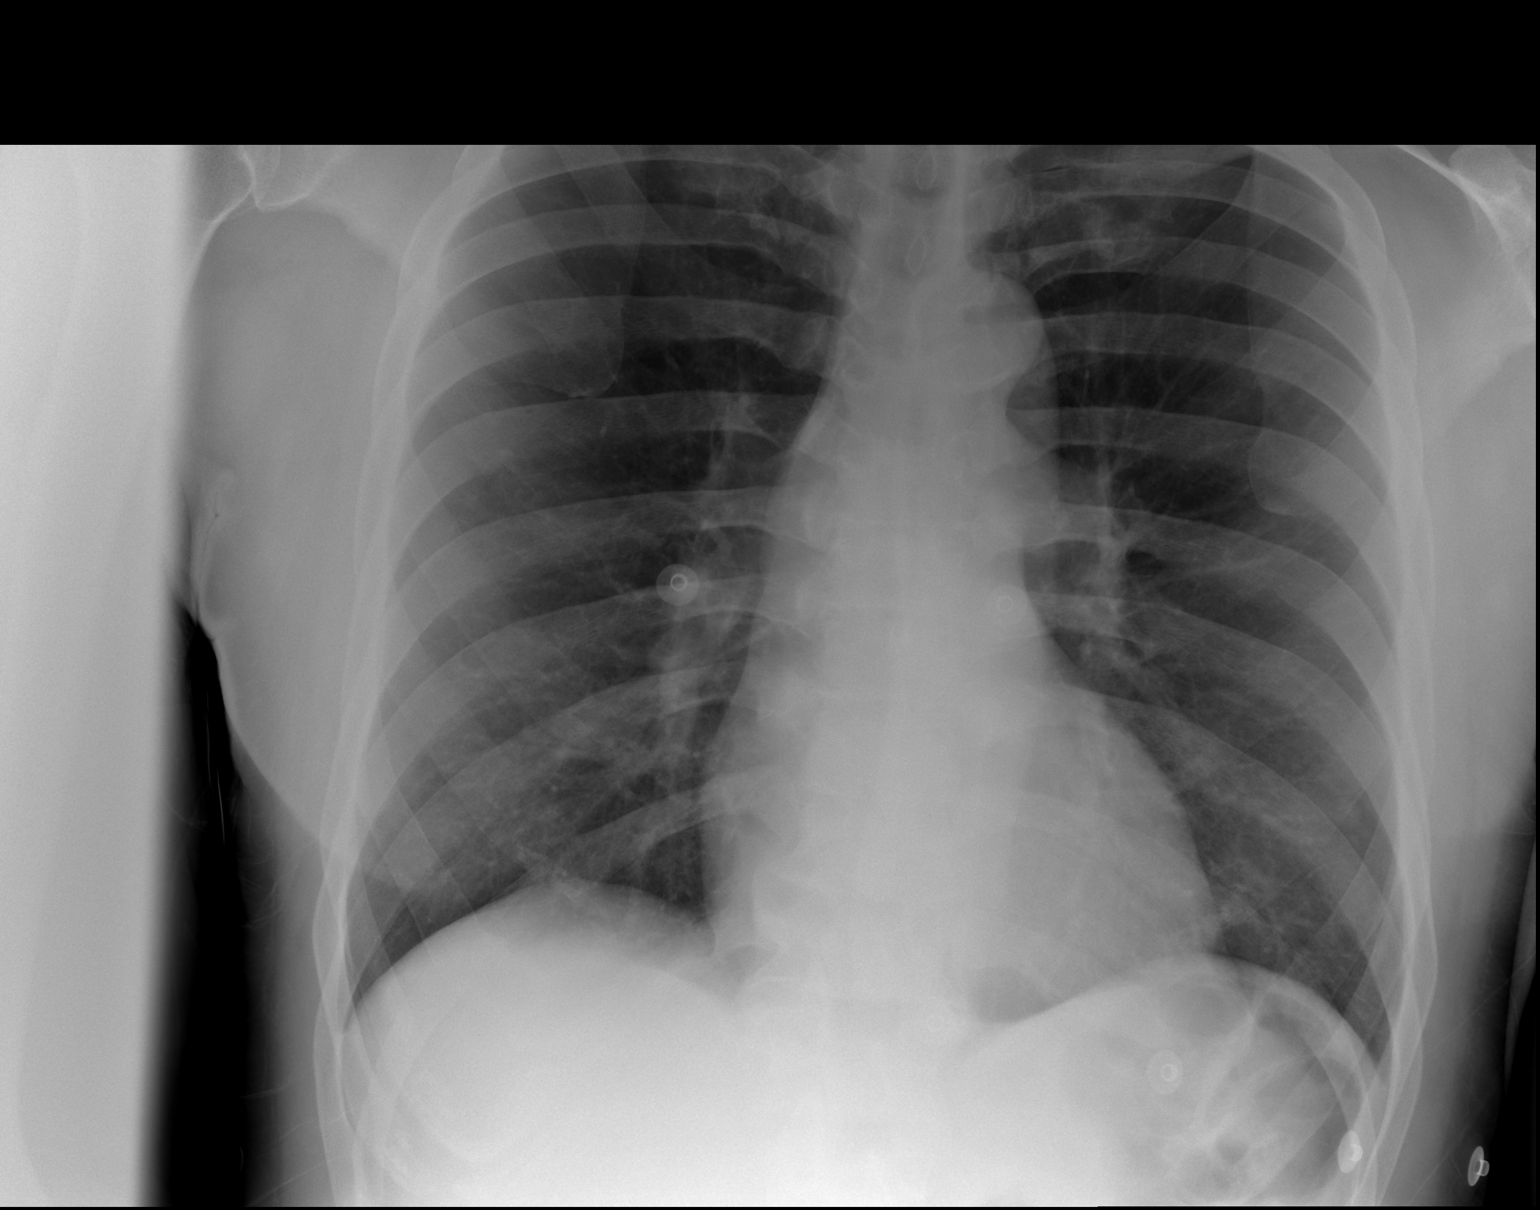

[1 of 1 positions shown; findings below may reference images not displayed]

FINDINGS: The heart size and mediastinal contours are within normal limits.
Both lungs are clear. The visualized skeletal structures are
unremarkable.
IMPRESSION: No active disease.

## 2021-11-23 MED ORDER — IPRATROPIUM-ALBUTEROL 0.5-2.5 (3) MG/3ML IN SOLN
3.0000 mL | Freq: Once | RESPIRATORY_TRACT | Status: AC
  Administered 2021-11-23: 06:00:00 3 mL via RESPIRATORY_TRACT
  Filled 2021-11-23: qty 3

## 2021-11-23 MED ORDER — HYDROMORPHONE HCL 1 MG/ML IJ SOLN
1.0000 mg | Freq: Once | INTRAMUSCULAR | Status: AC
  Administered 2021-11-23: 06:00:00 1 mg via INTRAVENOUS
  Filled 2021-11-23: qty 1

## 2021-11-23 MED ORDER — ALBUTEROL SULFATE HFA 108 (90 BASE) MCG/ACT IN AERS
1.0000 | INHALATION_SPRAY | Freq: Once | RESPIRATORY_TRACT | Status: AC
  Administered 2021-11-23: 08:00:00 1 via RESPIRATORY_TRACT
  Filled 2021-11-23: qty 6.7

## 2021-11-23 MED ORDER — DOXYCYCLINE HYCLATE 100 MG PO CAPS: 100.0000 mg | ORAL_CAPSULE | Freq: Two times a day (BID) | ORAL | 0 refills | Status: DC

## 2021-11-23 MED ORDER — DOXYCYCLINE HYCLATE 100 MG PO TABS
100.0000 mg | ORAL_TABLET | Freq: Once | ORAL | Status: AC
  Administered 2021-11-23: 08:00:00 100 mg via ORAL
  Filled 2021-11-23: qty 1

## 2021-11-23 MED ORDER — OXYCODONE HCL 5 MG PO TABS
5.0000 mg | ORAL_TABLET | Freq: Once | ORAL | Status: AC
  Administered 2021-11-23: 08:00:00 5 mg via ORAL
  Filled 2021-11-23: qty 1

## 2021-11-23 NOTE — ED Triage Notes (Signed)
Pt BIB EMS with complaints of cellulitis to both legs. Pt has SHOB x several days.

## 2021-11-23 NOTE — Discharge Instructions (Addendum)
You were seen in the emergency department today for pain and swelling in your legs.  Your work-up was overall reassuring.  Your blood work looks similar to prior labs you have had done.  You were given a nebulizer breathing treatment to help with your trouble breathing and chest tightness, this improves your breath sounds.  Please use your albuterol inhaler as needed.  We are starting you on doxycycline for possible infection, please take this as prescribed.  We have prescribed you new medication(s) today. Discuss the medications prescribed today with your pharmacist as they can have adverse effects and interactions with your other medicines including over the counter and prescribed medications. Seek medical evaluation if you start to experience new or abnormal symptoms after taking one of these medicines, seek care immediately if you start to experience difficulty breathing, feeling of your throat closing, facial swelling, or rash as these could be indications of a more serious allergic reaction  Please have your blood pressure rechecked by your primary care as it was elevated in the emergency department.  Please be sure to keep your wounds clean and covered.  Please follow-up with your primary care provider, call today to see if they have a sooner available follow-up appointment.  We have also provided the wound care clinic's information.  Return to the emergency department for new or worsening symptoms including but not limited to new or worsening pain, fever, redness of the legs, swelling extending up the legs, inability to keep fluids down, chest pain, trouble breathing, passing out, or any other concerns.

## 2021-11-23 NOTE — ED Provider Notes (Signed)
[facility] DEPT Provider Note   CSN: [id number] Arrival date & time: 11/23/21  0039     History Chief Complaint  Patient presents with   Cellulitis    [name] is a 55 y.o. male with a hx of COPD, CHF Most recent EF<20% with grade 2 diastolic dysfunction, DM, hypertension, schizoaffective disorder, polysubstance use (cocaine, EtOH), prior osteomyelitis, prior stroke, and CAD who presents to the ED with complaints of worsening lower extremity pain and swelling as well as some shortness of breath and chest tightness.  Patient was discharged from the hospital on the 22nd after admission for right lower extremity cellulitis, received IV antibiotics for this and was discharged with plan for wound care and bacitracin.  He states that the pain and swelling in his legs improved while he was in the hospital but have subsequently worsened again.  He started to have intermittent chest tightness with shortness of breath and wheezing.  No alleviating or aggravating factors to his symptoms.  He denies fever, chills, cough, change in his baseline neuropathy, or hemoptysis.  HPI     Past Medical History:  Diagnosis Date   Asthma    Bipolar 1 disorder (HCC)    CHF (congestive heart failure) (HCC)    COPD (chronic obstructive pulmonary disease) ([place])    Depression    Diabetes mellitus without complication (HCC)    GERD (gastroesophageal reflux disease)    Homelessness    Hypertension    QT prolongation    Schizoaffective disorder ([place])     Patient Active Problem List   Diagnosis Date Noted   Cellulitis of right lower extremity 11/13/2021   Type 2 diabetes mellitus ([place]) 11/13/2021   Antisocial personality disorder ([place]) 11/04/2021   Malingering 11/04/2021   Foot pain 04/23/2021   Acute on chronic combined systolic (congestive) and diastolic (congestive) heart failure ([place]) 03/02/2021   History of stroke 03/02/2021   Substance use disorder 03/02/2021    Skin ulcer of right great toe with necrosis of bone ([place])    Encounter for assessment of healthcare decision-making capacity    Pain, dental    Dystonia    Dysarthria    Shock liver [id number]   Acute metabolic encephalopathy [id number]   CAD (coronary artery disease) 01/04/2021   HTN (hypertension) 01/04/2021   Bipolar I disorder ([place]) 01/04/2021   Cocaine abuse ([place]) 01/04/2021   Homelessness 01/04/2021   HLD (hyperlipidemia) 01/04/2021   Osteomyelitis of great toe (HCC)    Ulcer of great toe, left, with necrosis of bone (HCC)    Ulcer of great toe, right, with necrosis of muscle (HCC)    Acute osteomyelitis of toe, left (HCC)    Acute on chronic combined systolic and diastolic CHF (congestive heart failure) ([place]) 12/20/2020   Respiratory failure ([place])    Ischemic stroke ([place]) 12/17/2020   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   Diabetic foot ulcer ([place]) 12/08/2020   CHF (congestive heart failure) ([place]) 11/14/2020   CHF exacerbation ([place]) 11/13/2020   Hypokalemia    Hyperlipidemia    Acute respiratory failure with hypoxia ([place]) 10/26/2020   COPD with acute exacerbation ([place]) 10/14/2020   Acute on chronic combined systolic and diastolic CHF (congestive heart failure) ([place]) 10/02/2020   Near syncope 07/21/2020   Schizoaffective disorder ([place]) 06/24/2019   SOB (shortness of breath)    QT prolongation 05/25/2019   Cocaine abuse (HCC)    Elevated troponin    Homeless    Suicidal ideation    Bilateral  foot pain 04/05/2019   Housing situation unstable 03/28/2019   Bilateral lower extremity edema    Asthma 12/26/2016   Tobacco use disorder 12/26/2016   Schizophrenia ([place]) 12/26/2016   Cocaine abuse with cocaine-induced mood disorder ([place]) 09/06/2016   Alcohol use disorder, mild, abuse 07/31/2016   Substance induced mood disorder ([place]) 07/30/2016   Schizoaffective disorder, bipolar type ([place]) 05/31/2016   Cocaine use disorder, severe, dependence ([place]) 05/31/2016   Hypertension  05/31/2016    Past Surgical History:  Procedure Laterality Date   AMPUTATION TOE Bilateral 01/02/2021   Procedure: AMPUTATION TOE;  Surgeon: Evelina Bucy, DPM;  Location: [facility];  Service: Podiatry;  Laterality: Bilateral;  Ok to leave patient on hospital bed   IR ANGIO INTRA EXTRACRAN SEL COM CAROTID INNOMINATE BILAT MOD SED  12/17/2020   IR ANGIO VERTEBRAL SEL VERTEBRAL BILAT MOD SED  12/17/2020   RADIOLOGY WITH ANESTHESIA N/A 12/17/2020   Procedure: IR WITH ANESTHESIA;  Surgeon: Radiologist, Medication, MD;  Location: [facility];  Service: Radiology;  Laterality: N/A;       Family History  Problem Relation Age of Onset   Drug abuse Mother    Suicidality Mother     Social History   Tobacco Use   Smoking status: Every Day    Years: 30.00    Types: Cigarettes   Smokeless tobacco: Never  Vaping Use   Vaping Use: Never used  Substance Use Topics   Alcohol use: Yes    Comment: infrequently   Drug use: Yes    Types: Cocaine, Marijuana, "Crack" cocaine    Home Medications Prior to Admission medications   Medication Sig Start Date End Date Taking? Authorizing Provider  albuterol (VENTOLIN HFA) 108 (90 Base) MCG/ACT inhaler Inhale 2 puffs into the lungs every 6 (six) hours as needed for wheezing or shortness of breath. 11/09/21   Wyvonnia Dusky, MD  atorvastatin (LIPITOR) 10 MG tablet Take 4 tablets (40 mg total) by mouth daily. Patient not taking: Reported on 11/13/2021 11/09/21   Wyvonnia Dusky, MD  bacitracin ointment Apply topically 2 (two) times daily. 11/21/21   Allie Bossier, MD  carbamide peroxide (DEBROX) 6.5 % OTIC solution Place 5 drops into both ears twice daily. Patient not taking: Reported on 11/09/2021 11/04/21   Suella Broad, FNP  carvedilol (COREG) 3.125 MG tablet Take 1 tablet (3.125 mg total) by mouth 2 (two) times daily with a meal. 11/09/21   Trifan, Carola Rhine, MD  diphenhydrAMINE-zinc acetate (BENADRYL) cream Apply topically 3 (three) times  daily as needed for itching. Apply to affected area 11/21/21   Allie Bossier, MD  hydrocerin (EUCERIN) CREA Apply 1 application topically daily. 11/22/21   Allie Bossier, MD  levETIRAcetam (KEPPRA) 500 MG tablet Take 1 tablet (500 mg total) by mouth 2 (two) times daily. Patient not taking: Reported on 11/13/2021 11/09/21 12/09/21  Wyvonnia Dusky, MD  OLANZapine (ZYPREXA) 5 MG tablet Take 1 tablet (5 mg total) by mouth at bedtime. 11/09/21   Wyvonnia Dusky, MD  oxyCODONE (ROXICODONE) 15 MG immediate release tablet Take 15 mg by mouth every 4 (four) hours as needed for pain.    [provider]  pregabalin (LYRICA) 100 MG capsule Take 100 mg by mouth 3 (three) times daily. Patient not taking: Reported on 11/02/2021    [provider]  sacubitril-valsartan (ENTRESTO) 24-26 MG Take 1 tablet by mouth 2 (two) times daily. 11/09/21 12/09/21  Wyvonnia Dusky, MD  spironolactone (ALDACTONE)  25 MG tablet Take 1/2 tablets (12.5 mg total) by mouth daily. 11/09/21   Terald Sleeper, MD  traMADol (ULTRAM) 50 MG tablet Take 1 tablet (50 mg total) by mouth every 12 (twelve) hours as needed for up to 10 doses. 11/09/21   Terald Sleeper, MD  umeclidinium bromide (INCRUSE ELLIPTA) 62.5 MCG/INH AEPB Inhale 1 puff into the lungs daily. 11/16/20   Pokhrel, Rebekah [place]field, MD    Allergies    Ibuprofen, Acetaminophen, Lyrica cr [pregabalin er], and Peanut-containing drug products  Review of Systems   Review of Systems  Constitutional:  Negative for chills and fever.  Respiratory:  Positive for chest tightness, shortness of breath and wheezing.   Cardiovascular:  Positive for leg swelling.  Gastrointestinal:  Negative for abdominal pain, nausea and vomiting.  Musculoskeletal:  Positive for arthralgias and myalgias.  Skin:  Positive for wound.  Neurological:  Negative for syncope.  All other systems reviewed and are negative.  Physical Exam Updated Vital Signs BP (!) 169/149 (BP Location:  Right Arm)    Pulse (!) 112    Temp (!) 97.3 F (36.3 C) (Oral)    Resp 18    SpO2 100%   Physical Exam Vitals and nursing note reviewed.  Constitutional:      General: He is not in acute distress.    Appearance: He is not ill-appearing or toxic-appearing.  HENT:     Head: Normocephalic and atraumatic.  Eyes:     Pupils: Pupils are equal, round, and reactive to light.  Cardiovascular:     Rate and Rhythm: Regular rhythm. Tachycardia present.  Pulmonary:     Breath sounds: Wheezing (throughout) present. No rales.  Abdominal:     General: There is no distension.     Palpations: Abdomen is soft.     Tenderness: There is no abdominal tenderness. There is no guarding or rebound.  Musculoskeletal:     Cervical back: Neck supple. No rigidity.     Comments: Bilateral lower leg edema noted, right>L. Multiple wounds throughout the RLE as pictured below. No significant purulent drainage, no significant active bleeding. No fluctuance. No significant crepitus. Not overly hot to the touch, but warm with similar perfusion to the rest of the extremities/body. Bilateral 1st toe amputations. Tender to palpation to the bilateral feet and somewhat to the lower legs. Compartments are fairly soft. Patient refusing my attempts to palpate/doppler pulses.   Skin:    General: Skin is warm and dry.  Neurological:     Mental Status: He is alert.           ED Results / Procedures / Treatments   Labs (all labs ordered are listed, but only abnormal results are displayed) Labs Reviewed  CBC WITH DIFFERENTIAL/PLATELET - Abnormal; Notable for the following components:      Result Value   WBC 10.7 (*)    Platelets 450 (*)    Monocytes Absolute 1.1 (*)    All other components within normal limits  COMPREHENSIVE METABOLIC PANEL - Abnormal; Notable for the following components:   CO2 21 (*)    Glucose, Bld 109 (*)    BUN 26 (*)    Total Protein 8.2 (*)    All other components within normal limits   TROPONIN I (HIGH SENSITIVITY) - Abnormal; Notable for the following components:   Troponin I (High Sensitivity) 28 (*)    All other components within normal limits  TROPONIN I (HIGH SENSITIVITY) - Abnormal; Notable for the following components:  Troponin I (High Sensitivity) 29 (*)    All other components within normal limits  RESP PANEL BY RT-PCR (FLU A&B, COVID) [id number]  BRAIN NATRIURETIC PEPTIDE    EKG EKG Interpretation  Date/Time:  Saturday November 23 2021 04:41:56 EST Ventricular Rate:  97 PR Interval:  173 QRS Duration: 92 QT Interval:  388 QTC Calculation: 493 R Axis:   48 Text Interpretation: Sinus rhythm Right atrial enlargement Left ventricular hypertrophy Borderline prolonged QT interval Confirmed by Quintella Reichert [phone]) on 11/23/2021 5:32:38 AM  Radiology DG Chest Portable 1 View  Result Date: 11/23/2021 CLINICAL DATA:  Shortness of breath EXAM: PORTABLE CHEST 1 VIEW COMPARISON:  11/13/2021 FINDINGS: The heart size and mediastinal contours are within normal limits. Both lungs are clear. The visualized skeletal structures are unremarkable. IMPRESSION: No active disease. Electronically Signed   By: Ulyses Jarred M.D.   On: 11/23/2021 01:34    Procedures Procedures   Medications Ordered in ED Medications - No data to display  ED Course  I have reviewed the triage vital signs and the nursing notes.  Pertinent labs & imaging results that were available during my care of the patient were reviewed by me and considered in my medical decision making (see chart for details).    MDM Rules/Calculators/A&P                           Patient presents to the ED with complaints of lower extremity pain and swelling as well as some shortness of breath and chest tightness.  Patient is nontoxic, hypertensive, and tachycardic on arrival. BP similar to prior visits on repeat, question accuracy of initial BP. He is noted to have some wheezing.  Additional history obtained:   Additional history obtained from chart review & nursing note review.  Recent hospital admission 11/13/21-11/21/21 for right lower extremity cellulitis- noted to have LE edema bilaterally, diuresed well, CT imaging without florid osteomyelitis or necrotizing fascitis, received IV abx, seen by wound care- discharged with bacitracin.  Has follow up scheduled with family medicine on January 6th.  11/16/21:  CT left foot/tib/fib: Skin thickening and superficial soft tissue swelling of the foot and to a lesser degree the lower leg, as can be seen in cellulitis or lymphedema. Prior great toe amputation with adjacent soft tissue thickening. No evidence of soft tissue abscess or bony destruction to suggest osteomyelitis on CT. MRI would be more sensitive. CT right foot/tib/fib: Skin thickening and soft tissue swelling of the right foot and to a lesser degree of the right lower leg, as can be seen in cellulitis or lymphedema. Prior great toe amputation with adjacent soft tissue thickening. No evidence of soft tissue abscess or bony destruction to suggest osteomyelitis on CT. MRI would be more sensitive.  EKG: Sinus rhythm Right atrial enlargement Left ventricular hypertrophy Borderline prolonged QT interval   Lab Tests:  I Ordered, reviewed, and interpreted labs, which included:  CBC: Minimal blood cell count elevation, abdominal cytosis CMP: No significant electrolyte derangement Troponin: 28, 29, flat, similar to prior BNP: Within normal limits  Imaging Studies ordered:  CXR ordered in triage, I independently reviewed, formal radiology impression shows: No active disease  ED Course:  Patient with complaints of chest tightness/shortness of breath, troponins are flat, EKG without acute ischemia, low suspicion for ACS.  Chest x-ray without pneumonia.  Chest x-ray additionally without findings of fluid overload and BNP is normal, does not seem consistent with acute CHF exacerbation.  Patient wheezing  on initial assessment, given DuoNeb, lungs now clear and chest tightness/dyspnea resolved.  Only required 1 DuoNeb, does not necessarily seem consistent with COPD exacerbation requiring steroids especially in the setting of patient's recent hospital admission for an infection. HR improved, remains mildly tachycardic but has received albuterol. In regards to his lower extremities he will not allow me to palpate pulses secondary to pain, however appropriate temperature to the touch is noted, he recently had DVT study that was negative, recently had CT scans that did not show osteomyelitis.  He does have some wounds present to his right lower extremity, however there is no purulent drainage, there is no significant erythema, no palpable fluctuance.  Per description of the wounds from patient's last admission seem fairly similar.  Discussed with attending  Dr. Ralene Bathe who has evaluated patient as shared visit, plan for discharge home on doxycycline. Wound care performed in the ED per last wound care nurse note. I discussed results, treatment plan, need for follow-up, and return precautions with the patient. Provided opportunity for questions, patient confirmed understanding and is in agreement with plan.    Portions of this note were generated with [employer]. Dictation errors may occur despite best attempts at proofreading.   Final Clinical Impression(s) / ED Diagnoses Final diagnoses:  Pain in both lower extremities    Rx / DC Orders ED Discharge Orders          Ordered    doxycycline (VIBRAMYCIN) 100 MG capsule  2 times daily        11/23/21 0714             Amaryllis Dyke, PA-C 11/23/21 0731    Quintella Reichert, MD 11/25/21 [phone]

## 2021-11-24 ENCOUNTER — Encounter (HOSPITAL_COMMUNITY): Payer: Self-pay | Admitting: Emergency Medicine

## 2021-11-24 ENCOUNTER — Ambulatory Visit (HOSPITAL_COMMUNITY)
Admission: EM | Admit: 2021-11-24 | Discharge: 2021-11-24 | Disposition: A | Discharge status: Other Acute Inpatient Hospital | Payer: Medicaid - Out of State | Attending: Urology | Admitting: Urology

## 2021-11-24 ENCOUNTER — Other Ambulatory Visit: Payer: Self-pay

## 2021-11-24 ENCOUNTER — Emergency Department (HOSPITAL_COMMUNITY)
Admission: EM | Admit: 2021-11-24 | Discharge: 2021-11-25 | Disposition: A | Discharge status: Home / Self Care | Payer: Medicaid - Out of State | Attending: Emergency Medicine | Admitting: Emergency Medicine

## 2021-11-24 DIAGNOSIS — Z5900 Homelessness unspecified: Secondary | ICD-10-CM | POA: Insufficient documentation

## 2021-11-24 DIAGNOSIS — J449 Chronic obstructive pulmonary disease, unspecified: Secondary | ICD-10-CM | POA: Insufficient documentation

## 2021-11-24 DIAGNOSIS — R45851 Suicidal ideations: Secondary | ICD-10-CM | POA: Insufficient documentation

## 2021-11-24 DIAGNOSIS — Z993 Dependence on wheelchair: Secondary | ICD-10-CM | POA: Insufficient documentation

## 2021-11-24 DIAGNOSIS — F250 Schizoaffective disorder, bipolar type: Secondary | ICD-10-CM | POA: Diagnosis not present

## 2021-11-24 DIAGNOSIS — Z9151 Personal history of suicidal behavior: Secondary | ICD-10-CM | POA: Insufficient documentation

## 2021-11-24 DIAGNOSIS — I5043 Acute on chronic combined systolic (congestive) and diastolic (congestive) heart failure: Secondary | ICD-10-CM | POA: Insufficient documentation

## 2021-11-24 DIAGNOSIS — J45909 Unspecified asthma, uncomplicated: Secondary | ICD-10-CM | POA: Insufficient documentation

## 2021-11-24 DIAGNOSIS — F32A Depression, unspecified: Secondary | ICD-10-CM | POA: Diagnosis not present

## 2021-11-24 DIAGNOSIS — Z20822 Contact with and (suspected) exposure to covid-19: Secondary | ICD-10-CM | POA: Diagnosis not present

## 2021-11-24 DIAGNOSIS — R4781 Slurred speech: Secondary | ICD-10-CM | POA: Insufficient documentation

## 2021-11-24 DIAGNOSIS — I110 Hypertensive heart disease with heart failure: Secondary | ICD-10-CM | POA: Insufficient documentation

## 2021-11-24 DIAGNOSIS — R296 Repeated falls: Secondary | ICD-10-CM | POA: Insufficient documentation

## 2021-11-24 DIAGNOSIS — Z91010 Allergy to peanuts: Secondary | ICD-10-CM | POA: Insufficient documentation

## 2021-11-24 DIAGNOSIS — F17210 Nicotine dependence, cigarettes, uncomplicated: Secondary | ICD-10-CM | POA: Diagnosis not present

## 2021-11-24 DIAGNOSIS — Z8673 Personal history of transient ischemic attack (TIA), and cerebral infarction without residual deficits: Secondary | ICD-10-CM | POA: Insufficient documentation

## 2021-11-24 DIAGNOSIS — E119 Type 2 diabetes mellitus without complications: Secondary | ICD-10-CM | POA: Diagnosis not present

## 2021-11-24 DIAGNOSIS — I2510 Atherosclerotic heart disease of native coronary artery without angina pectoris: Secondary | ICD-10-CM | POA: Diagnosis not present

## 2021-11-24 DIAGNOSIS — Z046 Encounter for general psychiatric examination, requested by authority: Secondary | ICD-10-CM | POA: Diagnosis present

## 2021-11-24 MED ORDER — LORAZEPAM 1 MG PO TABS
0.0000 mg | ORAL_TABLET | Freq: Four times a day (QID) | ORAL | Status: DC
  Administered 2021-11-25: 05:00:00 0 mg via ORAL
  Filled 2021-11-24: qty 1
  Filled 2021-11-24: qty 4

## 2021-11-24 MED ORDER — THIAMINE HCL 100 MG PO TABS
100.0000 mg | ORAL_TABLET | Freq: Every day | ORAL | Status: DC
  Filled 2021-11-24: qty 1

## 2021-11-24 MED ORDER — THIAMINE HCL 100 MG/ML IJ SOLN: 100.0000 mg | Freq: Every day | INTRAMUSCULAR | Status: DC

## 2021-11-24 MED ORDER — LORAZEPAM 2 MG/ML IJ SOLN: 0.0000 mg | Freq: Four times a day (QID) | INTRAMUSCULAR | Status: DC

## 2021-11-24 MED ORDER — LORAZEPAM 1 MG PO TABS: 0.0000 mg | ORAL_TABLET | Freq: Two times a day (BID) | ORAL | Status: DC

## 2021-11-24 MED ORDER — AEROCHAMBER Z-STAT PLUS/MEDIUM MISC
1.0000 | Freq: Once | Status: DC
  Filled 2021-11-24: qty 1

## 2021-11-24 MED ORDER — LORAZEPAM 2 MG/ML IJ SOLN: 0.0000 mg | Freq: Two times a day (BID) | INTRAMUSCULAR | Status: DC

## 2021-11-24 MED ORDER — ALBUTEROL SULFATE HFA 108 (90 BASE) MCG/ACT IN AERS: 2.0000 | INHALATION_SPRAY | RESPIRATORY_TRACT | Status: DC | PRN

## 2021-11-24 NOTE — ED Provider Notes (Addendum)
[facility] DEPT Provider Note   CSN: [id number] Arrival date & time: 11/24/21  2158     History Chief Complaint  Patient presents with   Suicidal    Odin Chadwick is a 55 y.o. male COPD, CHF Most recent EF<20% with grade 2 diastolic dysfunction, DM, hypertension, schizoaffective disorder, polysubstance use (cocaine, EtOH), prior osteomyelitis, prior stroke, and CAD presents to the emergency department with complaints of suicidal ideation.  Reports that he has had these feelings in the past but had more recently been feeling better into the last 3 days.  Reports that over the last 3 days he has become despondent and believes he cannot live anymore.  Made a plan to roll his wheelchair out into traffic.  Patient reports that the wound on his lower leg is healing and he has no other complaints today.  He denies chest pain, shortness of breath, worsening leg swelling from baseline.  Reports that he has no medical concerns today but is concerned about his psychiatric health.  No specific aggravating or alleviating factors.  Records reviewed - pt was evaluated by TTS around 2100 for SI.  Ene Ajibola recommends continuous assessment and reassessment by psychiatry in the AM.  He was unable to stay at [facility] due to his wheelchair usage.    The history is provided by the patient and medical records. No language interpreter was used.      Past Medical History:  Diagnosis Date   Asthma    Bipolar 1 disorder (HCC)    CHF (congestive heart failure) (HCC)    COPD (chronic obstructive pulmonary disease) ([place])    Depression    Diabetes mellitus without complication ([place])    GERD (gastroesophageal reflux disease)    Homelessness    Hypertension    QT prolongation    Schizoaffective disorder ([place])     Patient Active Problem List   Diagnosis Date Noted   Cellulitis of right lower extremity 11/13/2021   Type 2 diabetes mellitus ([place]) 11/13/2021   Antisocial  personality disorder ([place]) 11/04/2021   Malingering 11/04/2021   Foot pain 04/23/2021   Acute on chronic combined systolic (congestive) and diastolic (congestive) heart failure ([place]) 03/02/2021   History of stroke 03/02/2021   Substance use disorder 03/02/2021   Skin ulcer of right great toe with necrosis of bone ([place])    Encounter for assessment of healthcare decision-making capacity    Pain, dental    Dystonia    Dysarthria    Shock liver [id number]   Acute metabolic encephalopathy [id number]   CAD (coronary artery disease) 01/04/2021   HTN (hypertension) 01/04/2021   Bipolar I disorder ([place]) 01/04/2021   Cocaine abuse ([place]) 01/04/2021   Homelessness 01/04/2021   HLD (hyperlipidemia) 01/04/2021   Osteomyelitis of great toe (HCC)    Ulcer of great toe, left, with necrosis of bone (HCC)    Ulcer of great toe, right, with necrosis of muscle (HCC)    Acute osteomyelitis of toe, left (HCC)    Acute on chronic combined systolic and diastolic CHF (congestive heart failure) ([place]) 12/20/2020   Respiratory failure ([place])    Ischemic stroke ([place]) 12/17/2020   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   Diabetic foot ulcer ([place]) 12/08/2020   CHF (congestive heart failure) ([place]) 11/14/2020   CHF exacerbation ([place]) 11/13/2020   Hypokalemia    Hyperlipidemia    Acute respiratory failure with hypoxia ([place]) 10/26/2020   COPD with acute exacerbation ([place]) 10/14/2020   Acute on chronic combined systolic  and diastolic CHF (congestive heart failure) ([place]) 10/02/2020   Near syncope 07/21/2020   Schizoaffective disorder ([place]) 06/24/2019   SOB (shortness of breath)    QT prolongation 05/25/2019   Cocaine abuse ([place])    Elevated troponin    Homeless    Suicidal ideation    Bilateral foot pain 04/05/2019   Housing situation unstable 03/28/2019   Bilateral lower extremity edema    Asthma 12/26/2016   Tobacco use disorder 12/26/2016   Schizophrenia ([place]) 12/26/2016   Cocaine abuse with cocaine-induced  mood disorder ([place]) 09/06/2016   Alcohol use disorder, mild, abuse 07/31/2016   Substance induced mood disorder ([place]) 07/30/2016   Schizoaffective disorder, bipolar type ([place]) 05/31/2016   Cocaine use disorder, severe, dependence ([place]) 05/31/2016   Hypertension 05/31/2016    Past Surgical History:  Procedure Laterality Date   AMPUTATION TOE Bilateral 01/02/2021   Procedure: AMPUTATION TOE;  Surgeon: Evelina Bucy, DPM;  Location: [facility];  Service: Podiatry;  Laterality: Bilateral;  Ok to leave patient on hospital bed   IR ANGIO INTRA EXTRACRAN SEL COM CAROTID INNOMINATE BILAT MOD SED  12/17/2020   IR ANGIO VERTEBRAL SEL VERTEBRAL BILAT MOD SED  12/17/2020   RADIOLOGY WITH ANESTHESIA N/A 12/17/2020   Procedure: IR WITH ANESTHESIA;  Surgeon: Radiologist, Medication, MD;  Location: [facility];  Service: Radiology;  Laterality: N/A;       Family History  Problem Relation Age of Onset   Drug abuse Mother    Suicidality Mother     Social History   Tobacco Use   Smoking status: Every Day    Years: 30.00    Types: Cigarettes   Smokeless tobacco: Never  Vaping Use   Vaping Use: Never used  Substance Use Topics   Alcohol use: Yes    Comment: infrequently   Drug use: Yes    Types: Cocaine, Marijuana, "Crack" cocaine    Home Medications Prior to Admission medications   Medication Sig Start Date End Date Taking? Authorizing Provider  albuterol (VENTOLIN HFA) 108 (90 Base) MCG/ACT inhaler Inhale 2 puffs into the lungs every 6 (six) hours as needed for wheezing or shortness of breath. 11/09/21  Yes Trifan, Carola Rhine, MD  atorvastatin (LIPITOR) 10 MG tablet Take 4 tablets (40 mg total) by mouth daily. 11/09/21  Yes Wyvonnia Dusky, MD  carvedilol (COREG) 3.125 MG tablet Take 1 tablet (3.125 mg total) by mouth 2 (two) times daily with a meal. 11/09/21  Yes Trifan, Carola Rhine, MD  levETIRAcetam (KEPPRA) 500 MG tablet Take 1 tablet (500 mg total) by mouth 2 (two) times daily. 11/09/21 12/09/21  Yes Trifan, Carola Rhine, MD  OLANZapine (ZYPREXA) 5 MG tablet Take 1 tablet (5 mg total) by mouth at bedtime. 11/09/21  Yes Trifan, Carola Rhine, MD  sacubitril-valsartan (ENTRESTO) 24-26 MG Take 1 tablet by mouth 2 (two) times daily. 11/09/21 12/09/21 Yes Trifan, Carola Rhine, MD  spironolactone (ALDACTONE) 25 MG tablet Take 1/2 tablets (12.5 mg total) by mouth daily. 11/09/21  Yes Wyvonnia Dusky, MD  traMADol (ULTRAM) 50 MG tablet Take 1 tablet (50 mg total) by mouth every 12 (twelve) hours as needed for up to 10 doses. 11/09/21  Yes Wyvonnia Dusky, MD  bacitracin ointment Apply topically 2 (two) times daily. Patient not taking: Reported on 11/23/2021 11/21/21   Allie Bossier, MD  carbamide peroxide (DEBROX) 6.5 % OTIC solution Place 5 drops into both ears twice daily. Patient not taking: Reported on 11/23/2021 11/04/21   Starkes-Perry,  Juel Burrow, FNP  diphenhydrAMINE-zinc acetate (BENADRYL) cream Apply topically 3 (three) times daily as needed for itching. Apply to affected area Patient not taking: Reported on 11/23/2021 11/21/21   Drema Dallas, MD  doxycycline (VIBRAMYCIN) 100 MG capsule Take 1 capsule (100 mg total) by mouth 2 (two) times daily. Patient not taking: Reported on 11/24/2021 11/23/21   Petrucelli, Samantha R, PA-C  hydrocerin (EUCERIN) CREA Apply 1 application topically daily. Patient not taking: Reported on 11/25/2021 11/22/21   Drema Dallas, MD  oxyCODONE (ROXICODONE) 15 MG immediate release tablet Take 15 mg by mouth every 4 (four) hours as needed for pain. Patient not taking: Reported on 11/25/2021    [provider]  pregabalin (LYRICA) 100 MG capsule Take 100 mg by mouth 3 (three) times daily. Patient not taking: Reported on 11/02/2021    [provider]  umeclidinium bromide (INCRUSE ELLIPTA) 62.5 MCG/INH AEPB Inhale 1 puff into the lungs daily. Patient not taking: Reported on 11/24/2021 11/16/20   Joycelyn Das, MD    Allergies    Ibuprofen,  Acetaminophen, Lyrica cr [pregabalin er], and Peanut-containing drug products  Review of Systems   Review of Systems  Constitutional:  Negative for appetite change, diaphoresis, fatigue, fever and unexpected weight change.  HENT:  Negative for mouth sores.   Eyes:  Negative for visual disturbance.  Respiratory:  Negative for cough, chest tightness, shortness of breath and wheezing.   Cardiovascular:  Negative for chest pain.  Gastrointestinal:  Negative for abdominal pain, constipation, diarrhea, nausea and vomiting.  Endocrine: Negative for polydipsia, polyphagia and polyuria.  Genitourinary:  Negative for dysuria, frequency, hematuria and urgency.  Musculoskeletal:  Negative for back pain and neck stiffness.  Skin:  Positive for wound (pt reports unchanged). Negative for rash.  Allergic/Immunologic: Negative for immunocompromised state.  Neurological:  Negative for syncope, light-headedness and headaches.  Hematological:  Does not bruise/bleed easily.  Psychiatric/Behavioral:  Positive for dysphoric mood and suicidal ideas. Negative for sleep disturbance. The patient is not nervous/anxious.    Physical Exam Updated Vital Signs BP (!) 157/117 (BP Location: Right Arm)    Pulse 95    Temp 97.8 F (36.6 C) (Oral)    Resp 16    Ht 5\' 9"  (1.753 m)    Wt 90.7 kg    SpO2 100%    BMI 29.53 kg/m   Physical Exam Vitals and nursing note reviewed.  Constitutional:      General: He is not in acute distress.    Appearance: He is not diaphoretic.  HENT:     Head: Normocephalic.  Eyes:     General: No scleral icterus.    Conjunctiva/sclera: Conjunctivae normal.  Cardiovascular:     Rate and Rhythm: Normal rate and regular rhythm.     Pulses: Normal pulses.          Radial pulses are 2+ on the right side and 2+ on the left side.  Pulmonary:     Effort: No tachypnea, accessory muscle usage, prolonged expiration, respiratory distress or retractions.     Breath sounds: No stridor.      Comments: Equal chest rise. No increased work of breathing. Abdominal:     General: There is no distension.     Palpations: Abdomen is soft.     Tenderness: There is no abdominal tenderness. There is no guarding or rebound.  Musculoskeletal:     Cervical back: Normal range of motion.     Comments: Moves all extremities equally and without  difficulty.  Skin:    General: Skin is warm and dry.     Capillary Refill: Capillary refill takes less than 2 seconds.     Comments: Wrap on RLE clean and dry  Neurological:     Mental Status: He is alert.     GCS: GCS eye subscore is 4. GCS verbal subscore is 5. GCS motor subscore is 6.     Comments: Speech is clear and goal oriented.  Psychiatric:        Attention and Perception: He does not perceive auditory or visual hallucinations.        Mood and Affect: Mood is depressed.        Thought Content: Thought content includes suicidal ideation. Thought content does not include homicidal ideation. Thought content includes suicidal plan. Thought content does not include homicidal plan.    ED Results / Procedures / Treatments   Labs (all labs ordered are listed, but only abnormal results are displayed) Labs Reviewed  RESP PANEL BY RT-PCR (FLU A&B, COVID) ARPGX2  COMPREHENSIVE METABOLIC PANEL  ETHANOL  SALICYLATE LEVEL  ACETAMINOPHEN LEVEL  CBC  RAPID URINE DRUG SCREEN, HOSP PERFORMED    Procedures Procedures   Medications Ordered in ED Medications  LORazepam (ATIVAN) injection 0-4 mg (has no administration in time range)    Or  LORazepam (ATIVAN) tablet 0-4 mg (has no administration in time range)  LORazepam (ATIVAN) injection 0-4 mg (has no administration in time range)    Or  LORazepam (ATIVAN) tablet 0-4 mg (has no administration in time range)  thiamine tablet 100 mg (has no administration in time range)    Or  thiamine (B-1) injection 100 mg (has no administration in time range)  aerochamber Z-Stat Plus/medium 1 each (has no  administration in time range)  albuterol (VENTOLIN HFA) 108 (90 Base) MCG/ACT inhaler 2 puff (has no administration in time range)  atorvastatin (LIPITOR) tablet 40 mg (has no administration in time range)  carvedilol (COREG) tablet 3.125 mg (has no administration in time range)  levETIRAcetam (KEPPRA) tablet 500 mg (has no administration in time range)  OLANZapine (ZYPREXA) tablet 5 mg (has no administration in time range)  sacubitril-valsartan (ENTRESTO) 24-26 mg per tablet (has no administration in time range)  spironolactone (ALDACTONE) tablet 12.5 mg (has no administration in time range)    ED Course  I have reviewed the triage vital signs and the nursing notes.  Pertinent labs & imaging results that were available during my care of the patient were reviewed by me and considered in my medical decision making (see chart for details).    MDM Rules/Calculators/A&P                          Patient presents with suicidal ideation.  Evaluated by TTS and psychiatric nurse practitioner who requests overnight observation and reevaluation in the a.m. by psychiatry.  Patient sent here for overnight boarding due to the use of his wheelchair.  He has no medical complaints at this time.  Medical clearance labs initiated.   3:46 AM Lab error with ordered medical clearance labs.  Will wait until patient is awake in AM for redraw. Pt has been calm and cooperative throughout his time here.  Sleeping at this time. Home medications ordered.  Pt is here voluntarily.   4:16 AM Pt is now c/o leg pain.  Dressing removed.  No changes to wound.  No fever or chills.  No purulent drainage or increased  warmth at the site. Pain medication ordered. Patient is willing to have labs redrawn at this time.         Final Clinical Impression(s) / ED Diagnoses Final diagnoses:  Suicidal ideation    Rx / DC Orders ED Discharge Orders     None        Ashtin Melichar, Gwenlyn Perking 11/25/21 0349     [place] Boyett, Jarrett Soho, PA-C 11/25/21 0417    Quintella Reichert, MD 11/25/21 [phone]

## 2021-11-24 NOTE — ED Triage Notes (Signed)
Pt BIB police because a citizen was worried about him sleeping in the cold. Upon police arrival pt states that he was suicidal. Pt states that his last suicide attempt was years ago.

## 2021-11-24 NOTE — BH Assessment (Signed)
Comprehensive Clinical Assessment (CCA) Note  11/24/2021 [name] [id number]  Discharge Disposition: Leandro Reasoner, NP, reviewed pt's chart and information and met with pt face-to-face and determined pt should receive continuous assessment and be re-assessed by psychiatry in the morning. Due to pt's required use of a wheelchair, pt was transferred to [facility].  The patient demonstrates the following risk factors for suicide: Chronic risk factors for suicide include: psychiatric disorder of Schizoaffective disorder, Bipolar type, substance use disorder, and previous suicide attempts over a year ago . Acute risk factors for suicide include: unemployment and social withdrawal/isolation. Protective factors for this patient include:  none . Considering these factors, the overall suicide risk at this point appears to be high. Patient is not appropriate for outpatient follow up.  Therefore, a 1:1 sitter is recommended for suicide precautions.  [place] ED from 11/24/2021 in [place] DEPT ED from 11/23/2021 in [place] DEPT ED to Hosp-Admission (Discharged) from 11/13/2021 in [facility] [place] [facility]  C-SSRS RISK CATEGORY High Risk Error: Q3, 4, or 5 should not be populated when Q2 is No No Risk     Chief Complaint:  Chief Complaint  Patient presents with   Suicidal   Visit Diagnosis: F25.0, Schizoaffective disorder, Bipolar type  CCA Screening, Triage and Referral (STR) [name] is a 55 year old patient who was brought to the [facility] by the [employer]. Pt states the police were called because he was sleeping outside. Pt states he has been experiencing SI with a plan to roll himself into traffic. Pt shares he has a hx of schizo-affective disorder and cocaine adn EtOH abuse. Pt denies HI, VH, NSSIB, access to guns/weapons, or engagement with the legal system. Pt has a hx of AH, though he denies he is experiencing them at this  time.  Pt is oriented x5. His recent/remote memory is intact. Pt was cooperative throughout the assessment process. Pt's insight, judgement, and impulse control is poor at this time.  Patient Reported Information How did you hear about [place]? Legal System  What Is the Reason for Your Visit/Call Today? Pt states the police were called because he was sleeping outside. Pt states he has been experiencing SI with a plan to roll himself into traffic. Pt shares he has a hx of schizo-affective disorder and cocaine adn EtOH abuse. Pt denies HI, VH, NSSIB, access to guns/weapons, or engagement with the legal system. Pt has a hx of AH, though he denies he is experiencing them at this time.  How Long Has This Been Causing You Problems? > than 6 months  What Do You Feel Would Help You the Most Today? Treatment for Depression or other mood problem; Medication(s)   Have You Recently Had Any Thoughts About Hurting Yourself? Yes  Are You Planning to Commit Suicide/Harm Yourself At This time? Yes   Have you Recently Had Thoughts About Hurting Someone [place] Dawn? No  Are You Planning to Harm Someone at This Time? No  Explanation: No data recorded  Have You Used Any Alcohol or Drugs in the Past 24 Hours? Yes  How Long Ago Did You Use Drugs or Alcohol? No data recorded What Did You Use and How Much? Pt states he drank beer earlier today.   Do You Currently Have a Therapist/Psychiatrist? No  Name of Therapist/Psychiatrist: No data recorded  Have You Been Recently Discharged From Any Office Practice or Programs? No  Explanation of Discharge From Practice/Program: No data recorded    CCA Screening Triage  Referral Assessment Type of Contact: Face-to-Face  Telemedicine Service Delivery:   Is this Initial or Reassessment? Initial Assessment  Date Telepsych consult ordered in [facility]:  07/03/21  Time Telepsych consult ordered in [facility]:  2346  Location of Assessment: [facility] Assessment Services  Provider  Location: [facility] Assessment Services   Collateral Involvement: Pt declined to provide verbal consent for clinician to make contact with friends/family for colateral information.   Does Patient Have a Stage manager Guardian? No data recorded Name and Contact of Legal Guardian: No data recorded If Minor and Not Living with Parent(s), Who has Custody? N/A  Is CPS involved or ever been involved? Never  Is APS involved or ever been involved? Never   Patient Determined To Be At Risk for Harm To Self or Others Based on Review of Patient Reported Information or Presenting Complaint? Yes, for Self-Harm  Method: No data recorded Availability of Means: No data recorded Intent: No data recorded Notification Required: No data recorded Additional Information for Danger to Others Potential: No data recorded Additional Comments for Danger to Others Potential: No data recorded Are There Guns or Other Weapons in Your Home? No data recorded Types of Guns/Weapons: No data recorded Are These Weapons Safely Secured?                            No data recorded Who Could Verify You Are Able To Have These Secured: No data recorded Do You Have any Outstanding Charges, Pending Court Dates, Parole/Probation? No data recorded Contacted To Inform of Risk of Harm To Self or Others: Law Enforcement    Does Patient Present under Involuntary Commitment? No  IVC Papers Initial File Date: 07/03/21   [place] of Residence: [place]   Patient Currently Receiving the Following Services: Not Receiving Services   Determination of Need: Urgent (48 hours)   Options For Referral: Medication Management; Other: Comment; Outpatient Therapy (Continuous Assessment at [facility])     CCA Biopsychosocial Patient Reported Schizophrenia/Schizoaffective Diagnosis in Past: Yes   Strengths: Pt is able to identify he requires assistance for his mental health concerns. Pt is able to seek medical care for his medical  concerns.   Mental Health Symptoms Depression:   Difficulty Concentrating; Irritability; Sleep (too much or little); Hopelessness; Tearfulness; Worthlessness; Increase/decrease in appetite; Fatigue   Duration of Depressive symptoms:  Duration of Depressive Symptoms: Greater than two weeks   Mania:   None   Anxiety:    None   Psychosis:   None   Duration of Psychotic symptoms:    Trauma:   None   Obsessions:   N/A   Compulsions:   N/A   Inattention:   N/A   Hyperactivity/Impulsivity:   None   Oppositional/Defiant Behaviors:   None   Emotional Irregularity:   Mood lability; Potentially harmful impulsivity; Recurrent suicidal behaviors/gestures/threats   Other Mood/Personality Symptoms:   None noted    Mental Status Exam Appearance and self-care  Stature:   Average   Weight:   Average weight   Clothing:   Age-appropriate   Grooming:   Normal   Cosmetic use:   None   Posture/gait:   Normal   Motor activity:   Not Remarkable   Sensorium  Attention:   Normal   Concentration:   Normal   Orientation:   X5   Recall/memory:   Normal   Affect and Mood  Affect:   Depressed   Mood:  Depressed   Relating  Eye contact:   Avoided   Facial expression:   Responsive   Attitude toward examiner:   Cooperative   Thought and Language  Speech flow:  Slurred   Thought content:   Appropriate to Mood and Circumstances   Preoccupation:   None   Hallucinations:   None   Organization:  No data recorded  [employer] of Knowledge:   Average   Intelligence:   Average   Abstraction:   Normal   Judgement:   Impaired   Reality Testing:   Adequate   Insight:   Gaps   Decision Making:   Impulsive   Social Functioning  Social Maturity:   Impulsive   Social Judgement:   Heedless   Stress  Stressors:   Housing   Coping Ability:   Deficient supports   Skill Deficits:   Self-control   Supports:    Support needed     Religion: Religion/Spirituality Are You A Religious Person?:  (Not assessed) How Might This Affect Treatment?: Not assessed  Leisure/Recreation: Leisure / Recreation Do You Have Hobbies?:  (Not assessed)  Exercise/Diet: Exercise/Diet Do You Exercise?:  (Not assessed) Have You Gained or Lost A Significant Amount of Weight in the Past Six Months?:  (Not assessed) Do You Follow a Special Diet?:  (Not assessed) Do You Have Any Trouble Sleeping?:  (Not assessed)   CCA Employment/Education Employment/Work Situation: Employment / Work Situation Employment Situation: On disability Why is Patient on Disability: Unknown How Long has Patient Been on Disability: 10+ years Patient's Job has Been Impacted by Current Illness:  (N/A) Describe how Patient's Job has Been Impacted: N/A Has Patient ever Been in the [employer]?: No  Education: Education Is Patient Currently Attending School?: No Last Grade Completed:  (Not assessed) Did You Attend College?: No Did You Have An Individualized Education Program (IIEP): No Did You Have Any Difficulty At School?: No Patient's Education Has Been Impacted by Current Illness: No   CCA Family/Childhood History Family and Relationship History: Family history Marital status:  (Not assessed) Does patient have children?:  (Not assessed)  Childhood History:  Childhood History By whom was/is the patient raised?:  (Not assessed) Did patient suffer any verbal/emotional/physical/sexual abuse as a child?:  (Not assessed) Did patient suffer from severe childhood neglect?:  (Not assessed) Has patient ever been sexually abused/assaulted/raped as an adolescent or adult?:  (Not assessed) Was the patient ever a victim of a crime or a disaster?:  (Not assessed) Witnessed domestic violence?:  (Not assessed) Has patient been affected by domestic violence as an adult?:  (Not assessed)  Child/Adolescent Assessment:     CCA Substance  Use Alcohol/Drug Use: Alcohol / Drug Use Pain Medications: See MAR Prescriptions: See MAR Over the Counter: See MAR History of alcohol / drug use?: No history of alcohol / drug abuse Longest period of sobriety (when/how long): Unknown Negative Consequences of Use: Work / Youth worker, Museum/gallery curator Withdrawal Symptoms:  (Pt denies)                         ASAM's:  Six Dimensions of Multidimensional Assessment  Dimension 1:  Acute Intoxication and/or Withdrawal Potential:   Dimension 1:  Description of individual's past and current experiences of substance use and withdrawal: UTA  Dimension 2:  Biomedical Conditions and Complications:   Dimension 2:  Description of patient's biomedical conditions and  complications: Pt currently has a leg that has problems, requiring a wheelchair  Dimension 3:  Emotional, Behavioral, or Cognitive Conditions and Complications:  Dimension 3:  Description of emotional, behavioral, or cognitive conditions and complications: UTA  Dimension 4:  Readiness to Change:  Dimension 4:  Description of Readiness to Change criteria: UTA  Dimension 5:  Relapse, Continued use, or Continued Problem Potential:  Dimension 5:  Relapse, continued use, or continued problem potential critiera description: UTA  Dimension 6:  Recovery/Living Environment:  Dimension 6:  Recovery/Iiving environment criteria description: UTA  ASAM Severity Score: ASAM's Severity Rating Score: 3  ASAM Recommended Level of Treatment: ASAM Recommended Level of Treatment: Level I Outpatient Treatment   Substance use Disorder (SUD) Substance Use Disorder (SUD)  Checklist Symptoms of Substance Use: Continued use despite having a persistent/recurrent physical/psychological problem caused/exacerbated by use, Evidence of tolerance, Persistent desire or unsuccessful efforts to cut down or control use, Presence of craving or strong urge to use, Substance(s) often taken in larger amounts or over longer times than  was intended  Recommendations for Services/Supports/Treatments: Recommendations for Services/Supports/Treatments Recommendations For Services/Supports/Treatments: Other (Comment) (Continuous Assessment at [facility])  Discharge Disposition: Discharge Disposition Medical Exam completed: Yes Disposition of Patient: Movement to [facility] or [facility] ED Mode of transportation if patient is discharged/movement?: Other (comment) ([employer])  Leandro Reasoner, NP, reviewed pt's chart and information and met with pt face-to-face and determined pt should receive continuous assessment and be re-assessed by psychiatry in the morning. Due to pt's required use of a wheelchair, pt was transferred to [facility].  DSM5 Diagnoses: Patient Active Problem List   Diagnosis Date Noted   Cellulitis of right lower extremity 11/13/2021   Type 2 diabetes mellitus ([place]) 11/13/2021   Antisocial personality disorder ([place]) 11/04/2021   Malingering 11/04/2021   Foot pain 04/23/2021   Acute on chronic combined systolic (congestive) and diastolic (congestive) heart failure ([place]) 03/02/2021   History of stroke 03/02/2021   Substance use disorder 03/02/2021   Skin ulcer of right great toe with necrosis of bone ([place])    Encounter for assessment of healthcare decision-making capacity    Pain, dental    Dystonia    Dysarthria    Shock liver 74/94/4967   Acute metabolic encephalopathy 59/16/3846   CAD (coronary artery disease) 01/04/2021   HTN (hypertension) 01/04/2021   Bipolar I disorder ([place]) 01/04/2021   Cocaine abuse ([place]) 01/04/2021   Homelessness 01/04/2021   HLD (hyperlipidemia) 01/04/2021   Osteomyelitis of great toe (HCC)    Ulcer of great toe, left, with necrosis of bone (HCC)    Ulcer of great toe, right, with necrosis of muscle (HCC)    Acute osteomyelitis of toe, left (HCC)    Acute on chronic combined systolic and diastolic CHF (congestive heart failure) ([place]) 12/20/2020   Respiratory failure ([place])    Ischemic stroke ([place])  12/17/2020   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   Diabetic foot ulcer ([place]) 12/08/2020   CHF (congestive heart failure) ([place]) 11/14/2020   CHF exacerbation ([place]) 11/13/2020   Hypokalemia    Hyperlipidemia    Acute respiratory failure with hypoxia ([place]) 10/26/2020   COPD with acute exacerbation ([place]) 10/14/2020   Acute on chronic combined systolic and diastolic CHF (congestive heart failure) ([place]) 10/02/2020   Near syncope 07/21/2020   Schizoaffective disorder ([place]) 06/24/2019   SOB (shortness of breath)    QT prolongation 05/25/2019   Cocaine abuse (HCC)    Elevated troponin    Homeless    Suicidal ideation    Bilateral foot pain 04/05/2019   Housing situation unstable  03/28/2019   Bilateral lower extremity edema    Asthma 12/26/2016   Tobacco use disorder 12/26/2016   Schizophrenia ([place]) 12/26/2016   Cocaine abuse with cocaine-induced mood disorder ([place]) 09/06/2016   Alcohol use disorder, mild, abuse 07/31/2016   Substance induced mood disorder ([place]) 07/30/2016   Schizoaffective disorder, bipolar type ([place]) 05/31/2016   Cocaine use disorder, severe, dependence ([place]) 05/31/2016   Hypertension 05/31/2016     Referrals to Alternative Service(s): Referred to Alternative Service(s):   Place:   Date:   Time:    Referred to Alternative Service(s):   Place:   Date:   Time:    Referred to Alternative Service(s):   Place:   Date:   Time:    Referred to Alternative Service(s):   Place:   Date:   Time:     Dannielle Burn, LMFT

## 2021-11-24 NOTE — ED Notes (Signed)
Provider notified of BP and being limited in movement

## 2021-11-24 NOTE — ED Provider Notes (Signed)
[facility] Medical Screening Exam  Patient Name: [name] MRN: [id number] Date of Evaluation: 11/24/21 Chief Complaint:   Diagnosis:  Final diagnoses:  None    History of Present illness: [name] is a 55 y.o. male with psychiatric history of schizoaffective disoder, bipolar type. Patient presented via law enforcement with chief complaint of suicidal ideation.    Patient was seen face to face and his chart was reviewed by this NP. On evaluation, patient is seen sitting in his wheelchair in [place] in no acute distress. Patient is alert and oriented X4. His speech is slurred (this is his baseline). He has a hx of CVA with right sided residual. He says he has wounds to LLE that was assessed and change in the ED yesterday; wound dressing is intact. Patient says that he is wheel chair bound and has hx of multiple falls. He denies medical conplaint at this time. He is able to maintain good eye contact. He reports is mood as depressed and his affect is congruent with his stated mood.  He says an unknown individual contacted law enforcement to bring him to [facility] because he was sleeping outside. He reports that he is depressed and having suicidal ideation with plan to roll his wheelchair into traffic. He is endorsing depressive symptoms of isolation, hopelessness, irritability, poor sleep, and worthlessness. He says he has been experiencing constant suicidal thoughts. He admits to past history of suicidal attempts. He denies access to weapon. He denies homicidal ideation, paranoia, and visual hallucination. He reports that he has hx of auditory hallucination but none currently. He admits to cocaine and alcohol abuse; he is unable to quantify how much of each substance he uses. He says "it depends on how much people give me." He last used cocaine on 11/22/21 and drank 1 can of beer on 12/25/202.     Psychiatric Specialty Exam  Presentation  General  Appearance:Appropriate for Environment  Eye Contact:Good  Speech:Slurred  Speech Volume:Normal  Handedness:Right   Mood and Affect  Mood:Depressed  Affect:Congruent   Thought Process  Thought Processes:Coherent  Descriptions of Associations:Intact  Orientation:Full (Time, Place and Person)  Thought Content:WDL  Diagnosis of Schizophrenia or Schizoaffective disorder in past: Yes   Hallucinations:Auditory  Ideas of Reference:None  Suicidal Thoughts:Yes, Active With Plan Without Intent; Without Plan; Without Means to Carry Out; Without Access to Means  Homicidal Thoughts:No   Sensorium  Memory:Immediate Fair; Recent Fair; Remote Fair  Judgment:Fair  Insight:Fair   Executive Functions  Concentration:Fair  Attention Span:Fair  Recall:Fair  Fund of Knowledge:Fair  Language:Fair   Psychomotor Activity  Psychomotor Activity:Normal   Assets  Assets:Communication Skills; Desire for Improvement   Sleep  Sleep:Fair  Number of hours: No data recorded  No data recorded  Physical Exam: Physical Exam Vitals and nursing note reviewed.  Constitutional:      General: He is not in acute distress.    Appearance: He is well-developed.  HENT:     Head: Normocephalic and atraumatic.  Eyes:     Conjunctiva/sclera: Conjunctivae normal.  Cardiovascular:     Rate and Rhythm: Normal rate and regular rhythm.     Heart sounds: No murmur heard. Pulmonary:     Effort: Pulmonary effort is normal. No respiratory distress.     Breath sounds: Normal breath sounds.  Abdominal:     Palpations: Abdomen is soft.     Tenderness: There is no abdominal tenderness.  Musculoskeletal:        General: Swelling present.  Cervical back: Neck supple.     Right lower leg: Edema present.     Left lower leg: Edema present.     Comments: Wound to RLE, amputation to great L and R toes   Skin:    General: Skin is dry.     Capillary Refill: Capillary refill takes less than  2 seconds.  Neurological:     Mental Status: He is alert.  Psychiatric:        Attention and Perception: Attention normal. He perceives auditory hallucinations.        Mood and Affect: Mood normal.        Speech: Speech is slurred.        Behavior: Behavior normal. Behavior is cooperative.        Thought Content: Thought content includes suicidal ideation. Thought content includes suicidal plan.   Review of Systems  Constitutional: Negative.   HENT: Negative.    Eyes: Negative.   Respiratory: Negative.    Cardiovascular: Negative.   Gastrointestinal: Negative.   Genitourinary: Negative.   Musculoskeletal: Negative.   Skin: Negative.   Neurological: Negative.   Endo/Heme/Allergies: Negative.   Psychiatric/Behavioral:  Positive for depression, hallucinations, substance abuse and suicidal ideas. The patient is nervous/anxious.   Blood pressure (!) 181/107, pulse 91, temperature 98.2 F (36.8 C), resp. rate 16, SpO2 100 %. There is no height or weight on file to calculate BMI.  Musculoskeletal: Strength & Muscle Tone: within normal limits Gait & Station: normal Patient leans: Right   [facility] MSE Discharge Disposition for Follow up and Recommendations: Patient is recommended over night observation with follow-up by psychiatry on 11/25/2021. Patient is wheelchair bound with hx of multiple falls; [facility] will [facility] transferred to [facility] for over night observation.    Maricela Bo, NP 11/24/2021, 9:48 PM

## 2021-11-25 DIAGNOSIS — R45851 Suicidal ideations: Secondary | ICD-10-CM

## 2021-11-25 LAB — RAPID URINE DRUG SCREEN, HOSP PERFORMED
Amphetamines: NOT DETECTED
Barbiturates: NOT DETECTED
Benzodiazepines: NOT DETECTED
Cocaine: POSITIVE — AB
Opiates: NOT DETECTED
Tetrahydrocannabinol: NOT DETECTED

## 2021-11-25 LAB — CBC
HCT: 40.2 % (ref 39.0–52.0)
Hemoglobin: 13.4 g/dL (ref 13.0–17.0)
MCH: 31.9 pg (ref 26.0–34.0)
MCHC: 33.3 g/dL (ref 30.0–36.0)
MCV: 95.7 fL (ref 80.0–100.0)
Platelets: 383 10*3/uL (ref 150–400)
RBC: 4.2 MIL/uL — ABNORMAL LOW (ref 4.22–5.81)
RDW: 13.3 % (ref 11.5–15.5)
WBC: 5.5 10*3/uL (ref 4.0–10.5)
nRBC: 0 % (ref 0.0–0.2)

## 2021-11-25 LAB — ACETAMINOPHEN LEVEL: Acetaminophen (Tylenol), Serum: <10 ug/mL — ABNORMAL LOW (ref 10–30)

## 2021-11-25 LAB — COMPREHENSIVE METABOLIC PANEL
ALT: 15 U/L (ref 0–44)
AST: 22 U/L (ref 15–41)
Albumin: 3.7 g/dL (ref 3.5–5.0)
Alkaline Phosphatase: 60 U/L (ref 38–126)
Anion gap: 8 (ref 5–15)
BUN: 17 mg/dL (ref 6–20)
CO2: 24 mmol/L (ref 22–32)
Calcium: 9.2 mg/dL (ref 8.9–10.3)
Chloride: 104 mmol/L (ref 98–111)
Creatinine, Ser: 0.96 mg/dL (ref 0.61–1.24)
GFR, Estimated: >60 mL/min (ref >60)
Glucose, Bld: 123 mg/dL — ABNORMAL HIGH (ref 70–99)
Potassium: 3.9 mmol/L (ref 3.5–5.1)
Sodium: 136 mmol/L (ref 135–145)
Total Bilirubin: 0.9 mg/dL (ref 0.3–1.2)
Total Protein: 7.9 g/dL (ref 6.5–8.1)

## 2021-11-25 LAB — RESP PANEL BY RT-PCR (FLU A&B, COVID) ARPGX2
Influenza A by PCR: NEGATIVE
Influenza B by PCR: NEGATIVE
SARS Coronavirus 2 by RT PCR: NEGATIVE

## 2021-11-25 LAB — ETHANOL: Alcohol, Ethyl (B): <10 mg/dL (ref <10)

## 2021-11-25 LAB — SALICYLATE LEVEL: Salicylate Lvl: <7 mg/dL — ABNORMAL LOW (ref 7.0–30.0)

## 2021-11-25 MED ORDER — ARIPIPRAZOLE 5 MG PO TABS: 5.0000 mg | ORAL_TABLET | Freq: Every day | ORAL | Status: DC

## 2021-11-25 MED ORDER — OLANZAPINE 5 MG PO TABS
5.0000 mg | ORAL_TABLET | Freq: Every day | ORAL | Status: DC
  Filled 2021-11-25: qty 1

## 2021-11-25 MED ORDER — ATORVASTATIN CALCIUM 40 MG PO TABS
40.0000 mg | ORAL_TABLET | Freq: Every day | ORAL | Status: DC
  Administered 2021-11-25: 09:00:00 40 mg via ORAL
  Filled 2021-11-25: qty 1

## 2021-11-25 MED ORDER — ALBUTEROL SULFATE HFA 108 (90 BASE) MCG/ACT IN AERS
2.0000 | INHALATION_SPRAY | Freq: Four times a day (QID) | RESPIRATORY_TRACT | Status: DC | PRN
  Filled 2021-11-25: qty 6.7

## 2021-11-25 MED ORDER — LEVETIRACETAM 500 MG PO TABS
500.0000 mg | ORAL_TABLET | Freq: Two times a day (BID) | ORAL | Status: DC
  Administered 2021-11-25: 09:00:00 500 mg via ORAL
  Filled 2021-11-25: qty 1

## 2021-11-25 MED ORDER — CARVEDILOL 3.125 MG PO TABS
3.1250 mg | ORAL_TABLET | Freq: Two times a day (BID) | ORAL | Status: DC
  Administered 2021-11-25: 09:00:00 3.125 mg via ORAL
  Filled 2021-11-25: qty 1

## 2021-11-25 MED ORDER — OLANZAPINE 5 MG PO TABS: 5.0000 mg | ORAL_TABLET | Freq: Every day | ORAL | Status: DC

## 2021-11-25 MED ORDER — OXYCODONE HCL 5 MG PO TABS
10.0000 mg | ORAL_TABLET | Freq: Once | ORAL | Status: AC
  Administered 2021-11-25: 04:00:00 10 mg via ORAL
  Filled 2021-11-25: qty 2

## 2021-11-25 MED ORDER — SACUBITRIL-VALSARTAN 24-26 MG PO TABS
1.0000 | ORAL_TABLET | Freq: Two times a day (BID) | ORAL | Status: DC
  Administered 2021-11-25: 09:00:00 1 via ORAL
  Filled 2021-11-25: qty 1

## 2021-11-25 MED ORDER — OLANZAPINE 5 MG PO TABS: 5.0000 mg | ORAL_TABLET | Freq: Every day | ORAL | 0 refills | Status: DC

## 2021-11-25 MED ORDER — SPIRONOLACTONE 12.5 MG HALF TABLET
12.5000 mg | ORAL_TABLET | Freq: Every day | ORAL | Status: DC
  Administered 2021-11-25: 09:00:00 12.5 mg via ORAL
  Filled 2021-11-25: qty 1

## 2021-11-25 NOTE — Progress Notes (Signed)
.Transition of Care [facility]) - Emergency Department Mini Assessment   Patient Details  Name: [name] MRN: [id number] Date of Birth: [birth year]  Transition of Care [name]) CM/SW Contact:    Larrie Kass, [name] Phone Number: 11/25/2021, 2:54 PM   Clinical Narrative:  CSW spoke to [place] with [facility] to arrange wheel chair transportation to [place]. He stated dropping another pt of and will be there shortly.    ED Mini Assessment:    Barriers to Discharge: No Barriers Identified     Means of departure: Hospital Transport  Interventions which prevented an admission or readmission: Transportation Screening, Homeless Screening    Patient Designer, industrial/product        ,            CMS Medicare.gov Compare Post Acute Care list provided to:: Patient    Admission diagnosis:  Suicidal Patient Active Problem List   Diagnosis Date Noted   Cellulitis of right lower extremity 11/13/2021   Type 2 diabetes mellitus (HCC) 11/13/2021   Antisocial personality disorder (HCC) 11/04/2021   Malingering 11/04/2021   Foot pain 04/23/2021   Acute on chronic combined systolic (congestive) and diastolic (congestive) heart failure (HCC) 03/02/2021   History of stroke 03/02/2021   Substance use disorder 03/02/2021   Skin ulcer of right great toe with necrosis of bone (HCC)    Encounter for assessment of healthcare decision-making capacity    Pain, dental    Dystonia    Dysarthria    Shock liver 01/04/2021   Acute metabolic encephalopathy 01/04/2021   CAD (coronary artery disease) 01/04/2021   HTN (hypertension) 01/04/2021   Bipolar I disorder (HCC) 01/04/2021   Cocaine abuse (HCC) 01/04/2021   Homelessness 01/04/2021   HLD (hyperlipidemia) 01/04/2021   Osteomyelitis of great toe (HCC)    Ulcer of great toe, left, with necrosis of bone (HCC)    Ulcer of great toe, right, with necrosis of muscle (HCC)    Acute osteomyelitis of toe, left (HCC)    Acute on  chronic combined systolic and diastolic CHF (congestive heart failure) (HCC) 12/20/2020   Respiratory failure (HCC)    Ischemic stroke (HCC) 12/17/2020   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   Diabetic foot ulcer (HCC) 12/08/2020   CHF (congestive heart failure) (HCC) 11/14/2020   CHF exacerbation (HCC) 11/13/2020   Hypokalemia    Hyperlipidemia    Acute respiratory failure with hypoxia (HCC) 10/26/2020   COPD with acute exacerbation (HCC) 10/14/2020   Acute on chronic combined systolic and diastolic CHF (congestive heart failure) (HCC) 10/02/2020   Near syncope 07/21/2020   Schizoaffective disorder (HCC) 06/24/2019   SOB (shortness of breath)    QT prolongation 05/25/2019   Cocaine abuse (HCC)    Elevated troponin    Homeless    Suicidal ideation    Bilateral foot pain 04/05/2019   Housing situation unstable 03/28/2019   Bilateral lower extremity edema    Asthma 12/26/2016   Tobacco use disorder 12/26/2016   Schizophrenia (HCC) 12/26/2016   Cocaine abuse with cocaine-induced mood disorder (HCC) 09/06/2016   Alcohol use disorder, mild, abuse 07/31/2016   Substance induced mood disorder (HCC) 07/30/2016   Schizoaffective disorder, bipolar type (HCC) 05/31/2016   Cocaine use disorder, severe, dependence (HCC) 05/31/2016   Hypertension 05/31/2016   PCP:  Pcp, No Pharmacy:   [facility] - [place], [name] - [address] [name] [address] Phone: [phone] Fax: [phone]  [facility]  [address] [name] [address] Phone: [phone] Fax: [phone]  [id number] [place], [name] - [address] AT [facility] OF [address] [name] [address] Phone: [phone] Fax: [phone]

## 2021-11-25 NOTE — ED Notes (Signed)
Pt self transferred to personal wheelchair. Pt self wheeled out to ed lobby to await for ride being arranged by Social work.  Pt cooperative and agreeable at this time. Pt given discharge instructions and prescriptions.

## 2021-11-25 NOTE — Discharge Instructions (Signed)
For your behavioral health needs you are advised to follow up with [facility] at your earliest opportunity: °     [facility] °     [address] °     [place], [place] [address] °     [phone] °     They offer psychiatry/medication management and therapy.  New patients are seen in their walk-in clinic.  Walk-in hours are Monday - Thursday from 8:00 am - 11:00 am for psychiatry, and Friday from 1:00 pm - 4:00 pm for therapy.  Walk-in patients are seen on a first come, first served basis, so try to arrive as early as possible for the best chance of being seen the same day.  Please note that to be eligible for services you must bring an ID or a piece of mail with your name and a [place] address. ° °

## 2021-11-25 NOTE — ED Notes (Signed)
Pt states that his leg feels hot and its throbbing, Dahlia Client PA aware and wanted it unwrapped so she could look at it

## 2021-11-25 NOTE — Consult Note (Signed)
[facility] Face-to-Face Psychiatry Consult   Reason for Consult:  Suicidal ideation Referring Physician:  Lorre Nick Patient Identification: [name] MRN:  [id number] Principal Diagnosis: <principal problem not specified> Diagnosis:  Active Problems:   * No active hospital problems. *   Total Time spent with patient: 30 minutes  Subjective:   [name] is a 55 y.o. male patient admitted with suicidal ideations. Patient was seen, chart reviewed and case discussed with Dr Gasper Sells. Patient has a psychiatric history significant for schizoaffective disorder, bipolar type, cocaine and ETOH abuse. His medical history is significant for asthma, chronic combined systolic and diastolic CHF, COPD, GERD, hypertension, QT prolongation. He is homeless. His UDS is positive for cocaine  today, BAL is negative. He presented to [facility] via [employer] with a complaint of suicidal ideation. He is vague about his SI and plan. He stated "I need to have a place to live, I just think things are going good in my life right now." He frequently mentions his wheelchair dependence. When asked if he has ever attempted suicide he replied "yes" but could not remember when or what he had done.     Patient stated he is homeless since a couple of weeks ago. Prior to that he was living in a nursing home, [place], he left of his own accord. He would not specify why. According to the note in his chart from 12/5, he was taking his olanzapine, however today he stated "that medication does not work and I did not get it filled."  He was seen in [facility] on 12/5 and given a prescription for Zyprexa and discharged. It is unclear if he got the prescription filled.   Patient has a high degree of secondary gain by seeking shelter in the emergency room and perseverates on his psychosocial issues and disabilities. He only wants to discuss his living situation and the help he needs finding a nursing home to go to. He has a history of  malingering. He becomes irritable with the line of questioning about his mental health and asks to be discharged. I explained he can stay and speak with social work and get his medication, he insists he wants to leave. Patient is voluntary. TOC consult placed for social work assistance. Patient denies homicidal ideation and auditory or visual hallucinations, he denies paranoia and delusions. Patient provided with a prescription for Zyprexa.  Patient does not meet criteria for inpatient hospitalization and will be discharged this date.    HPI: [name] is a 55 year old patient who was brought to the [facility] by the [employer]. Pt states the police were called because he was sleeping outside. Pt states he has been experiencing SI with a plan to roll himself into traffic. Pt shares he has a hx of schizo-affective disorder and cocaine adn EtOH abuse. Pt denies HI, VH, NSSIB, access to guns/weapons, or engagement with the legal system. Pt has a hx of AH, though he denies he is experiencing them at this time.  Past Psychiatric History: Schizoaffective disorder and cocaine and EtOH abuse  Risk to Self:   Risk to Others:   Prior Inpatient Therapy:   Prior Outpatient Therapy:    Past Medical History:  Past Medical History:  Diagnosis Date   Asthma    Bipolar 1 disorder (HCC)    CHF (congestive heart failure) (HCC)    COPD (chronic obstructive pulmonary disease) (HCC)    Depression    Diabetes mellitus without complication (HCC)    GERD (gastroesophageal reflux disease)  Homelessness    Hypertension    QT prolongation    Schizoaffective disorder [facility])     Past Surgical History:  Procedure Laterality Date   AMPUTATION TOE Bilateral 01/02/2021   Procedure: AMPUTATION TOE;  Surgeon: Park Liter, DPM;  Location: [facility] OR;  Service: Podiatry;  Laterality: Bilateral;  Ok to leave patient on hospital bed   IR ANGIO INTRA EXTRACRAN SEL COM CAROTID INNOMINATE BILAT MOD SED  12/17/2020   IR ANGIO VERTEBRAL  SEL VERTEBRAL BILAT MOD SED  12/17/2020   RADIOLOGY WITH ANESTHESIA N/A 12/17/2020   Procedure: IR WITH ANESTHESIA;  Surgeon: Radiologist, Medication, MD;  Location: [facility] OR;  Service: Radiology;  Laterality: N/A;   Family History:  Family History  Problem Relation Age of Onset   Drug abuse Mother    Suicidality Mother    Family Psychiatric  History: None noted in chart Social History:  Social History   Substance and Sexual Activity  Alcohol Use Yes   Comment: infrequently     Social History   Substance and Sexual Activity  Drug Use Yes   Types: Cocaine, Marijuana, "Crack" cocaine    Social History   Socioeconomic History   Marital status: Single    Spouse name: Not on file   Number of children: 4   Years of education: Not on file   Highest education level: Not on file  Occupational History   Occupation: N/A  Tobacco Use   Smoking status: Every Day    Years: 30.00    Types: Cigarettes   Smokeless tobacco: Never  Vaping Use   Vaping Use: Never used  Substance and Sexual Activity   Alcohol use: Yes    Comment: infrequently   Drug use: Yes    Types: Cocaine, Marijuana, "Crack" cocaine   Sexual activity: Not Currently    Partners: Female    Birth control/protection: None  Other Topics Concern   Not on file  Social History Narrative   ** Merged History Encounter **       Social Determinants of Health   Financial Resource Strain: Not on file  Food Insecurity: Not on file  Transportation Needs: Not on file  Physical Activity: Not on file  Stress: Not on file  Social Connections: Not on file   Additional Social History:    Allergies:   Allergies  Allergen Reactions   Ibuprofen Anaphylaxis   Acetaminophen Hives, Nausea And Vomiting and Rash    Pt overdosed on bottle of APAP 05/06/16    Lyrica Cr [Pregabalin Er] Other (See Comments)    Dystonia and dysarthria   Peanut-Containing Drug Products Other (See Comments)    Pt denies allergy to peanut butter  03/02/2021    Labs:  Results for orders placed or performed during the hospital encounter of 11/24/21 (from the past 48 hour(s))  Resp Panel by RT-PCR (Flu A&B, Covid) Nasopharyngeal Swab     Status: None   Collection Time: 11/25/21  2:50 AM   Specimen: Nasopharyngeal Swab; Nasopharyngeal(NP) swabs in vial transport medium  Result Value Ref Range   SARS Coronavirus 2 by RT PCR NEGATIVE NEGATIVE    Comment: (NOTE) SARS-CoV-2 target nucleic acids are NOT DETECTED.  The SARS-CoV-2 RNA is generally detectable in upper respiratory specimens during the acute phase of infection. The lowest concentration of SARS-CoV-2 viral copies this assay can detect is 138 copies/mL. A negative result does not preclude SARS-Cov-2 infection and should not be used as the sole basis for treatment or other patient  management decisions. A negative result may occur with  improper specimen collection/handling, submission of specimen other than nasopharyngeal swab, presence of viral mutation(s) within the areas targeted by this assay, and inadequate number of viral copies(<138 copies/mL). A negative result must be combined with clinical observations, patient history, and epidemiological information. The expected result is Negative.  Fact Sheet for Patients:  BloggerCourse.com  Fact Sheet for Healthcare Providers:  SeriousBroker.it  This test is no t yet approved or cleared by the [place] FDA and  has been authorized for detection and/or diagnosis of SARS-CoV-2 by FDA under an Emergency Use Authorization (EUA). This EUA will remain  in effect (meaning this test can be used) for the duration of the COVID-19 declaration under Section 564(b)(1) of the Act, 21 U.S.C.section 360bbb-3(b)(1), unless the authorization is terminated  or revoked sooner.       Influenza A by PCR NEGATIVE NEGATIVE   Influenza B by PCR NEGATIVE NEGATIVE    Comment: (NOTE) The  Xpert Xpress SARS-CoV-2/FLU/RSV plus assay is intended as an aid in the diagnosis of influenza from Nasopharyngeal swab specimens and should not be used as a sole basis for treatment. Nasal washings and aspirates are unacceptable for Xpert Xpress SARS-CoV-2/FLU/RSV testing.  Fact Sheet for Patients: BloggerCourse.com  Fact Sheet for Healthcare Providers: SeriousBroker.it  This test is not yet approved or cleared by the [place] FDA and has been authorized for detection and/or diagnosis of SARS-CoV-2 by FDA under an Emergency Use Authorization (EUA). This EUA will remain in effect (meaning this test can be used) for the duration of the COVID-19 declaration under Section 564(b)(1) of the Act, 21 U.S.C. section 360bbb-3(b)(1), unless the authorization is terminated or revoked.  Performed at [facility], [address], [place], [name] [address]   Comprehensive metabolic panel     Status: Abnormal   Collection Time: 11/25/21  4:22 AM  Result Value Ref Range   Sodium 136 135 - 145 mmol/L   Potassium 3.9 3.5 - 5.1 mmol/L   Chloride 104 98 - 111 mmol/L   CO2 24 22 - 32 mmol/L   Glucose, Bld 123 (H) 70 - 99 mg/dL    Comment: Glucose reference range applies only to samples taken after fasting for at least 8 hours.   BUN 17 6 - 20 mg/dL   Creatinine, Ser 8.41 0.61 - 1.24 mg/dL   Calcium 9.2 8.9 - 32.4 mg/dL   Total Protein 7.9 6.5 - 8.1 g/dL   Albumin 3.7 3.5 - 5.0 g/dL   AST 22 15 - 41 U/L   ALT 15 0 - 44 U/L   Alkaline Phosphatase 60 38 - 126 U/L   Total Bilirubin 0.9 0.3 - 1.2 mg/dL   GFR, Estimated >40 >10 mL/min    Comment: (NOTE) Calculated using the CKD-EPI Creatinine Equation (2021)    Anion gap 8 5 - 15    Comment: Performed at [facility], [address] [place], [name] [address]  Ethanol     Status: None   Collection Time: 11/25/21  4:22 AM  Result Value Ref Range    Alcohol, Ethyl (B) <10 <10 mg/dL    Comment: (NOTE) Lowest detectable limit for serum alcohol is 10 mg/dL.  For medical purposes only. Performed at [facility], [address] [place], [name] [address]   Salicylate level     Status: Abnormal   Collection Time: 11/25/21  4:22 AM  Result Value Ref Range   Salicylate  Lvl <7.0 (L) 7.0 - 30.0 mg/dL    Comment: Performed at [facility], [address] [place], [name] [address]  Acetaminophen level     Status: Abnormal   Collection Time: 11/25/21  4:22 AM  Result Value Ref Range   Acetaminophen (Tylenol), Serum <10 (L) 10 - 30 ug/mL    Comment: (NOTE) Therapeutic concentrations vary significantly. A range of 10-30 ug/mL  may be an effective concentration for many patients. However, some  are best treated at concentrations outside of this range. Acetaminophen concentrations >150 ug/mL at 4 hours after ingestion  and >50 ug/mL at 12 hours after ingestion are often associated with  toxic reactions.  Performed at [facility], [address] [place], [name] [address]   cbc     Status: Abnormal   Collection Time: 11/25/21  4:22 AM  Result Value Ref Range   WBC 5.5 4.0 - 10.5 K/uL   RBC 4.20 (L) 4.22 - 5.81 MIL/uL   Hemoglobin 13.4 13.0 - 17.0 g/dL   HCT 66.4 40.3 - 47.4 %   MCV 95.7 80.0 - 100.0 fL   MCH 31.9 26.0 - 34.0 pg   MCHC 33.3 30.0 - 36.0 g/dL   RDW 25.9 56.3 - 87.5 %   Platelets 383 150 - 400 K/uL   nRBC 0.0 0.0 - 0.2 %    Comment: Performed at [facility], [address], [place], [name] [address]  Rapid urine drug screen (hospital performed)     Status: Abnormal   Collection Time: 11/25/21  7:37 AM  Result Value Ref Range   Opiates NONE DETECTED NONE DETECTED   Cocaine POSITIVE (A) NONE DETECTED   Benzodiazepines NONE DETECTED NONE DETECTED   Amphetamines NONE DETECTED NONE DETECTED   Tetrahydrocannabinol NONE DETECTED NONE DETECTED    Barbiturates NONE DETECTED NONE DETECTED    Comment: (NOTE) DRUG SCREEN FOR MEDICAL PURPOSES ONLY.  IF CONFIRMATION IS NEEDED FOR ANY PURPOSE, NOTIFY LAB WITHIN 5 DAYS.  LOWEST DETECTABLE LIMITS FOR URINE DRUG SCREEN Drug Class                     Cutoff (ng/mL) Amphetamine and metabolites    1000 Barbiturate and metabolites    200 Benzodiazepine                 200 Tricyclics and metabolites     300 Opiates and metabolites        300 Cocaine and metabolites        300 THC                            50 Performed at [facility], [address] [place], [name] [address]     Current Facility-Administered Medications  Medication Dose Route Frequency Provider Last Rate Last Admin   aerochamber Z-Stat Plus/medium 1 each  1 each Other Once Muthersbaugh, Dahlia Client, PA-C       albuterol (VENTOLIN HFA) 108 (90 Base) MCG/ACT inhaler 2 puff  2 puff Inhalation Q6H PRN Muthersbaugh, Dahlia Client, PA-C       atorvastatin (LIPITOR) tablet 40 mg  40 mg Oral Daily Muthersbaugh, Dahlia Client, PA-C   40 mg at 11/25/21 0900   carvedilol (COREG) tablet 3.125 mg  3.125 mg Oral BID WC Muthersbaugh, Hannah, PA-C   3.125 mg at 11/25/21 0900   levETIRAcetam (KEPPRA) tablet 500 mg  500 mg Oral BID Muthersbaugh, Dahlia Client,  PA-C   500 mg at 11/25/21 0900   LORazepam (ATIVAN) injection 0-4 mg  0-4 mg Intravenous Q6H Muthersbaugh, Hannah, PA-C       Or   LORazepam (ATIVAN) tablet 0-4 mg  0-4 mg Oral Q6H Muthersbaugh, Hannah, PA-C   0 mg at 11/25/21 0500   [START ON 11/27/2021] LORazepam (ATIVAN) injection 0-4 mg  0-4 mg Intravenous Q12H Muthersbaugh, Hannah, PA-C       Or   [START ON 11/27/2021] LORazepam (ATIVAN) tablet 0-4 mg  0-4 mg Oral Q12H Muthersbaugh, Hannah, PA-C       OLANZapine (ZYPREXA) tablet 5 mg  5 mg Oral Daily Laveda Abbe, NP       sacubitril-valsartan (ENTRESTO) 24-26 mg per tablet  1 tablet Oral BID Muthersbaugh, Dahlia Client, PA-C   1 tablet at 11/25/21 0900   spironolactone  (ALDACTONE) tablet 12.5 mg  12.5 mg Oral Daily Muthersbaugh, Dahlia Client, PA-C   12.5 mg at 11/25/21 0900   thiamine tablet 100 mg  100 mg Oral Daily Muthersbaugh, Hannah, PA-C       Or   thiamine (B-1) injection 100 mg  100 mg Intravenous Daily Muthersbaugh, Dahlia Client, PA-C       Current Outpatient Medications  Medication Sig Dispense Refill   albuterol (VENTOLIN HFA) 108 (90 Base) MCG/ACT inhaler Inhale 2 puffs into the lungs every 6 (six) hours as needed for wheezing or shortness of breath. 8 g 2   atorvastatin (LIPITOR) 10 MG tablet Take 4 tablets (40 mg total) by mouth daily. 120 tablet 0   carvedilol (COREG) 3.125 MG tablet Take 1 tablet (3.125 mg total) by mouth 2 (two) times daily with a meal. 60 tablet 0   levETIRAcetam (KEPPRA) 500 MG tablet Take 1 tablet (500 mg total) by mouth 2 (two) times daily. 60 tablet 0   sacubitril-valsartan (ENTRESTO) 24-26 MG Take 1 tablet by mouth 2 (two) times daily. 60 tablet 0   spironolactone (ALDACTONE) 25 MG tablet Take 1/2 tablets (12.5 mg total) by mouth daily. 15 tablet 0   traMADol (ULTRAM) 50 MG tablet Take 1 tablet (50 mg total) by mouth every 12 (twelve) hours as needed for up to 10 doses. 10 tablet 0   bacitracin ointment Apply topically 2 (two) times daily. (Patient not taking: Reported on 11/23/2021) 120 g 0   carbamide peroxide (DEBROX) 6.5 % OTIC solution Place 5 drops into both ears twice daily. (Patient not taking: Reported on 11/23/2021) 15 mL 0   diphenhydrAMINE-zinc acetate (BENADRYL) cream Apply topically 3 (three) times daily as needed for itching. Apply to affected area (Patient not taking: Reported on 11/23/2021) 28.4 g 0   doxycycline (VIBRAMYCIN) 100 MG capsule Take 1 capsule (100 mg total) by mouth 2 (two) times daily. (Patient not taking: Reported on 11/24/2021) 20 capsule 0   hydrocerin (EUCERIN) CREA Apply 1 application topically daily. (Patient not taking: Reported on 11/25/2021) 228 g 0   OLANZapine (ZYPREXA) 5 MG tablet Take 1  tablet (5 mg total) by mouth daily. 14 tablet 0   oxyCODONE (ROXICODONE) 15 MG immediate release tablet Take 15 mg by mouth every 4 (four) hours as needed for pain. (Patient not taking: Reported on 11/25/2021)     pregabalin (LYRICA) 100 MG capsule Take 100 mg by mouth 3 (three) times daily. (Patient not taking: Reported on 11/02/2021)     umeclidinium bromide (INCRUSE ELLIPTA) 62.5 MCG/INH AEPB Inhale 1 puff into the lungs daily. (Patient not taking: Reported on 11/24/2021) 30 each 2  Musculoskeletal: Strength & Muscle Tone:  not tested, patient uses a wheelchair Gait & Station:  not tested, patient uses a wheelchair Patient leans: N/A  Psychiatric Specialty Exam:  Presentation  General Appearance: Appropriate for Environment  Eye Contact:Good  Speech:Slurred (baseline slurring, history of CVA)  Speech Volume:Normal  Handedness:Right  Mood and Affect  Mood:Depressed; Irritable  Affect:Congruent  Thought Process  Thought Processes:Coherent  Descriptions of Associations:Intact  Orientation:Full (Time, Place and Person)  Thought Content:Logical  History of Schizophrenia/Schizoaffective disorder:Yes  Duration of Psychotic Symptoms:No data recorded Hallucinations:Hallucinations: None  Ideas of Reference:None  Suicidal Thoughts:Suicidal Thoughts: Yes, Passive SI Active Intent and/or Plan: With Plan SI Passive Intent and/or Plan: Without Intent; Without Plan; With Means to Carry Out  Homicidal Thoughts:Homicidal Thoughts: No  Sensorium  Memory:Immediate Fair; Recent Fair; Remote Fair  Judgment:Fair  Insight:Fair  Executive Functions  Concentration:Fair  Attention Span:Fair  Recall:Fair  Fund of Knowledge:Fair  Language:Fair  Psychomotor Activity  Psychomotor Activity:Psychomotor Activity: Normal  Assets  Assets:Communication Skills; Financial Resources/Insurance; Resilience  Sleep  Sleep:Sleep: Fair Number of Hours of Sleep: 0 (patient in the  emergency room, sleep hours not recorded)  Physical Exam: Physical Exam Vitals and nursing note reviewed.  Constitutional:      Appearance: Normal appearance.  HENT:     Head: Normocephalic.     Nose: Nose normal.  Eyes:     Pupils: Pupils are equal, round, and reactive to light.  Cardiovascular:     Rate and Rhythm: Normal rate.  Pulmonary:     Effort: Pulmonary effort is normal.  Musculoskeletal:        General: Normal range of motion.     Cervical back: Normal range of motion.  Neurological:     General: No focal deficit present.     Mental Status: He is alert and oriented to person, place, and time.  Psychiatric:        Attention and Perception: Attention normal. He does not perceive auditory or visual hallucinations.        Mood and Affect: Mood is anxious and depressed. Affect is angry.        Speech: Speech is slurred (history of CVA).        Behavior: Behavior normal.        Thought Content: Thought content is not paranoid or delusional. Thought content includes suicidal (passive, without plan) ideation. Thought content does not include homicidal ideation. Thought content does not include homicidal or suicidal plan.   Review of Systems  Constitutional:  Negative for fever.  HENT:  Negative for congestion and sore throat.   Respiratory: Negative.  Negative for cough.   Cardiovascular: Negative.  Negative for chest pain.  Neurological: Negative.   Psychiatric/Behavioral:  Positive for depression, substance abuse and suicidal ideas.    Blood pressure (!) 164/100, pulse 88, temperature 97.8 F (36.6 C), temperature source Oral, resp. rate 16, height  (1.753 m), weight 90.7 kg, SpO2 100 %. Body mass index is 29.53 kg/m.  Treatment Plan Summary: Daily contact with patient to assess and evaluate symptoms and progress in treatment, Medication management, and Plan TOC consult placed for assistance. Prescription for Zyprexa provided.   Disposition: No evidence of  imminent risk to self or others at present.   Patient does not meet criteria for psychiatric inpatient admission. Supportive therapy provided about ongoing stressors. Discussed crisis plan, support from social network, calling 911, coming to the Emergency Department, and calling Suicide Hotline.  Laveda Abbe, NP 11/25/2021 5:59 PM

## 2021-11-25 NOTE — BH Assessment (Signed)
[facility] Assessment Progress Note   Per Elta Guadeloupe, NP, this voluntary pt does not require psychiatric hospitalization at this time.  Pt is psychiatrically cleared.  Discharge instructions include referral information for [facility].  A TOC consult has been ordered to address pt's psychosocial needs.  EDP Lorre Nick, MD and pt's nurses, Amber and Sherrilyn Rist, have been notified.  Doylene Canning, MA Triage Specialist [phone]

## 2021-12-02 ENCOUNTER — Inpatient Hospital Stay (HOSPITAL_COMMUNITY)
Admission: EM | Admit: 2021-12-02 | Discharge: 2021-12-09 | DRG: 638 | Disposition: A | Discharge status: Home / Self Care | Payer: Medicaid - Out of State | Attending: Family Medicine | Admitting: Family Medicine

## 2021-12-02 ENCOUNTER — Other Ambulatory Visit: Payer: Self-pay

## 2021-12-02 ENCOUNTER — Encounter (HOSPITAL_COMMUNITY): Payer: Self-pay

## 2021-12-02 DIAGNOSIS — E1169 Type 2 diabetes mellitus with other specified complication: Secondary | ICD-10-CM | POA: Diagnosis present

## 2021-12-02 DIAGNOSIS — I872 Venous insufficiency (chronic) (peripheral): Secondary | ICD-10-CM | POA: Diagnosis present

## 2021-12-02 DIAGNOSIS — S81809A Unspecified open wound, unspecified lower leg, initial encounter: Secondary | ICD-10-CM | POA: Diagnosis present

## 2021-12-02 DIAGNOSIS — L97829 Non-pressure chronic ulcer of other part of left lower leg with unspecified severity: Secondary | ICD-10-CM | POA: Diagnosis present

## 2021-12-02 DIAGNOSIS — Z79899 Other long term (current) drug therapy: Secondary | ICD-10-CM

## 2021-12-02 DIAGNOSIS — F250 Schizoaffective disorder, bipolar type: Secondary | ICD-10-CM | POA: Diagnosis present

## 2021-12-02 DIAGNOSIS — J449 Chronic obstructive pulmonary disease, unspecified: Secondary | ICD-10-CM | POA: Diagnosis present

## 2021-12-02 DIAGNOSIS — L089 Local infection of the skin and subcutaneous tissue, unspecified: Secondary | ICD-10-CM | POA: Diagnosis present

## 2021-12-02 DIAGNOSIS — Z20822 Contact with and (suspected) exposure to covid-19: Secondary | ICD-10-CM | POA: Diagnosis present

## 2021-12-02 DIAGNOSIS — I2510 Atherosclerotic heart disease of native coronary artery without angina pectoris: Secondary | ICD-10-CM | POA: Diagnosis present

## 2021-12-02 DIAGNOSIS — I5042 Chronic combined systolic (congestive) and diastolic (congestive) heart failure: Secondary | ICD-10-CM | POA: Diagnosis present

## 2021-12-02 DIAGNOSIS — I110 Hypertensive heart disease with heart failure: Secondary | ICD-10-CM | POA: Diagnosis present

## 2021-12-02 DIAGNOSIS — Z89412 Acquired absence of left great toe: Secondary | ICD-10-CM

## 2021-12-02 DIAGNOSIS — F17210 Nicotine dependence, cigarettes, uncomplicated: Secondary | ICD-10-CM | POA: Diagnosis present

## 2021-12-02 DIAGNOSIS — E11622 Type 2 diabetes mellitus with other skin ulcer: Principal | ICD-10-CM | POA: Diagnosis present

## 2021-12-02 DIAGNOSIS — L03115 Cellulitis of right lower limb: Secondary | ICD-10-CM

## 2021-12-02 DIAGNOSIS — Z5900 Homelessness unspecified: Secondary | ICD-10-CM

## 2021-12-02 DIAGNOSIS — F32A Depression, unspecified: Secondary | ICD-10-CM | POA: Diagnosis present

## 2021-12-02 DIAGNOSIS — Z89411 Acquired absence of right great toe: Secondary | ICD-10-CM

## 2021-12-02 DIAGNOSIS — I10 Essential (primary) hypertension: Secondary | ICD-10-CM | POA: Diagnosis present

## 2021-12-02 DIAGNOSIS — K219 Gastro-esophageal reflux disease without esophagitis: Secondary | ICD-10-CM | POA: Diagnosis present

## 2021-12-02 DIAGNOSIS — E1142 Type 2 diabetes mellitus with diabetic polyneuropathy: Secondary | ICD-10-CM | POA: Diagnosis present

## 2021-12-02 DIAGNOSIS — E782 Mixed hyperlipidemia: Secondary | ICD-10-CM | POA: Diagnosis present

## 2021-12-02 DIAGNOSIS — L97819 Non-pressure chronic ulcer of other part of right lower leg with unspecified severity: Secondary | ICD-10-CM | POA: Diagnosis present

## 2021-12-02 DIAGNOSIS — E119 Type 2 diabetes mellitus without complications: Secondary | ICD-10-CM | POA: Diagnosis present

## 2021-12-02 DIAGNOSIS — F1420 Cocaine dependence, uncomplicated: Secondary | ICD-10-CM | POA: Diagnosis present

## 2021-12-02 DIAGNOSIS — Z91199 Patient's noncompliance with other medical treatment and regimen due to unspecified reason: Secondary | ICD-10-CM

## 2021-12-02 LAB — CBC WITH DIFFERENTIAL/PLATELET
Abs Immature Granulocytes: 0.01 10*3/uL (ref 0.00–0.07)
Basophils Absolute: 0 10*3/uL (ref 0.0–0.1)
Basophils Relative: 1 %
Eosinophils Absolute: 0.1 10*3/uL (ref 0.0–0.5)
Eosinophils Relative: 1 %
HCT: 38 % — ABNORMAL LOW (ref 39.0–52.0)
Hemoglobin: 12.9 g/dL — ABNORMAL LOW (ref 13.0–17.0)
Immature Granulocytes: 0 %
Lymphocytes Relative: 36 %
Lymphs Abs: 2.4 10*3/uL (ref 0.7–4.0)
MCH: 32.3 pg (ref 26.0–34.0)
MCHC: 33.9 g/dL (ref 30.0–36.0)
MCV: 95 fL (ref 80.0–100.0)
Monocytes Absolute: 0.5 10*3/uL (ref 0.1–1.0)
Monocytes Relative: 7 %
Neutro Abs: 3.7 10*3/uL (ref 1.7–7.7)
Neutrophils Relative %: 55 %
Platelets: 354 10*3/uL (ref 150–400)
RBC: 4 MIL/uL — ABNORMAL LOW (ref 4.22–5.81)
RDW: 13.9 % (ref 11.5–15.5)
WBC: 6.7 10*3/uL (ref 4.0–10.5)
nRBC: 0 % (ref 0.0–0.2)

## 2021-12-02 LAB — BASIC METABOLIC PANEL
Anion gap: 8 (ref 5–15)
BUN: 12 mg/dL (ref 6–20)
CO2: 21 mmol/L — ABNORMAL LOW (ref 22–32)
Calcium: 8.6 mg/dL — ABNORMAL LOW (ref 8.9–10.3)
Chloride: 107 mmol/L (ref 98–111)
Creatinine, Ser: 0.91 mg/dL (ref 0.61–1.24)
GFR, Estimated: >60 mL/min (ref >60)
Glucose, Bld: 77 mg/dL (ref 70–99)
Potassium: 3.6 mmol/L (ref 3.5–5.1)
Sodium: 136 mmol/L (ref 135–145)

## 2021-12-02 LAB — LACTIC ACID, PLASMA: Lactic Acid, Venous: 1.6 mmol/L (ref 0.5–1.9)

## 2021-12-02 NOTE — ED Provider Notes (Signed)
Emergency Medicine Provider Triage Evaluation Note  [name] , a 56 y.o. male  was evaluated in triage.  Pt complains of bilateral lower extremity pain. Recent admission for cellulitis. Also seen in the ED on 12/24 and discharged with doxycycline for lower extremity wounds which he notes he finished today. No fever or chills. Denies chest pain and shortness of breath.   Review of Systems  Positive: wound Negative: fever  Physical Exam  BP (!) 168/88 (BP Location: Left Arm)    Pulse 98    Temp 97.8 F (36.6 C) (Oral)    Resp 18    SpO2 98%  Gen:   Awake, no distress   Resp:  Normal effort  MSK:   Moves extremities without difficulty  Other:  Wounds to lower extremities  Medical Decision Making  Medically screening exam initiated at 4:48 PM.  Appropriate orders placed.  [name] was informed that the remainder of the evaluation will be completed by another provider, this initial triage assessment does not replace that evaluation, and the importance of remaining in the ED until their evaluation is complete.  Routine labs to rule out systemic infection.   [name] 12/02/21 1650    [name] Pander, MD 12/02/21 2157

## 2021-12-02 NOTE — ED Triage Notes (Addendum)
Pt BIB EMS. Pt reports chronic bilateral foot pain. Pt reports running out of his home medication.   CBG 137

## 2021-12-03 ENCOUNTER — Emergency Department (HOSPITAL_COMMUNITY): Payer: Medicaid - Out of State

## 2021-12-03 ENCOUNTER — Encounter (HOSPITAL_COMMUNITY): Payer: Self-pay | Admitting: Internal Medicine

## 2021-12-03 ENCOUNTER — Other Ambulatory Visit (HOSPITAL_COMMUNITY): Payer: Self-pay

## 2021-12-03 DIAGNOSIS — S81809A Unspecified open wound, unspecified lower leg, initial encounter: Secondary | ICD-10-CM | POA: Diagnosis not present

## 2021-12-03 DIAGNOSIS — I2510 Atherosclerotic heart disease of native coronary artery without angina pectoris: Secondary | ICD-10-CM | POA: Diagnosis not present

## 2021-12-03 DIAGNOSIS — F1420 Cocaine dependence, uncomplicated: Secondary | ICD-10-CM | POA: Diagnosis not present

## 2021-12-03 DIAGNOSIS — I5042 Chronic combined systolic (congestive) and diastolic (congestive) heart failure: Secondary | ICD-10-CM | POA: Diagnosis not present

## 2021-12-03 DIAGNOSIS — I10 Essential (primary) hypertension: Secondary | ICD-10-CM

## 2021-12-03 DIAGNOSIS — F250 Schizoaffective disorder, bipolar type: Secondary | ICD-10-CM

## 2021-12-03 DIAGNOSIS — E782 Mixed hyperlipidemia: Secondary | ICD-10-CM

## 2021-12-03 DIAGNOSIS — E1169 Type 2 diabetes mellitus with other specified complication: Secondary | ICD-10-CM

## 2021-12-03 DIAGNOSIS — E1142 Type 2 diabetes mellitus with diabetic polyneuropathy: Secondary | ICD-10-CM

## 2021-12-03 LAB — RESP PANEL BY RT-PCR (FLU A&B, COVID) ARPGX2
Influenza A by PCR: NEGATIVE
Influenza B by PCR: NEGATIVE
SARS Coronavirus 2 by RT PCR: NEGATIVE

## 2021-12-03 LAB — RAPID URINE DRUG SCREEN, HOSP PERFORMED
Amphetamines: NOT DETECTED
Barbiturates: NOT DETECTED
Benzodiazepines: NOT DETECTED
Cocaine: POSITIVE — AB
Opiates: POSITIVE — AB
Tetrahydrocannabinol: NOT DETECTED

## 2021-12-03 LAB — CBG MONITORING, ED
Glucose-Capillary: 89 mg/dL (ref 70–99)
Glucose-Capillary: 125 mg/dL — ABNORMAL HIGH (ref 70–99)
Glucose-Capillary: 148 mg/dL — ABNORMAL HIGH (ref 70–99)

## 2021-12-03 LAB — PROCALCITONIN: Procalcitonin: <0.1 ng/mL

## 2021-12-03 LAB — C-REACTIVE PROTEIN: CRP: 3.3 mg/dL — ABNORMAL HIGH (ref <1.0)

## 2021-12-03 IMAGING — DX DG FOOT COMPLETE 3+V*R*
3 series · 3 of 3 positions shown · non-contrast
Comparison: Most recent radiograph [DATE]

CLINICAL DATA: Right foot pain. Foot and ankle cellulitis. Swelling
with ulcers and sloughing skin.

EXAM:
RIGHT FOOT COMPLETE - 3+ VIEW

[foot ap]
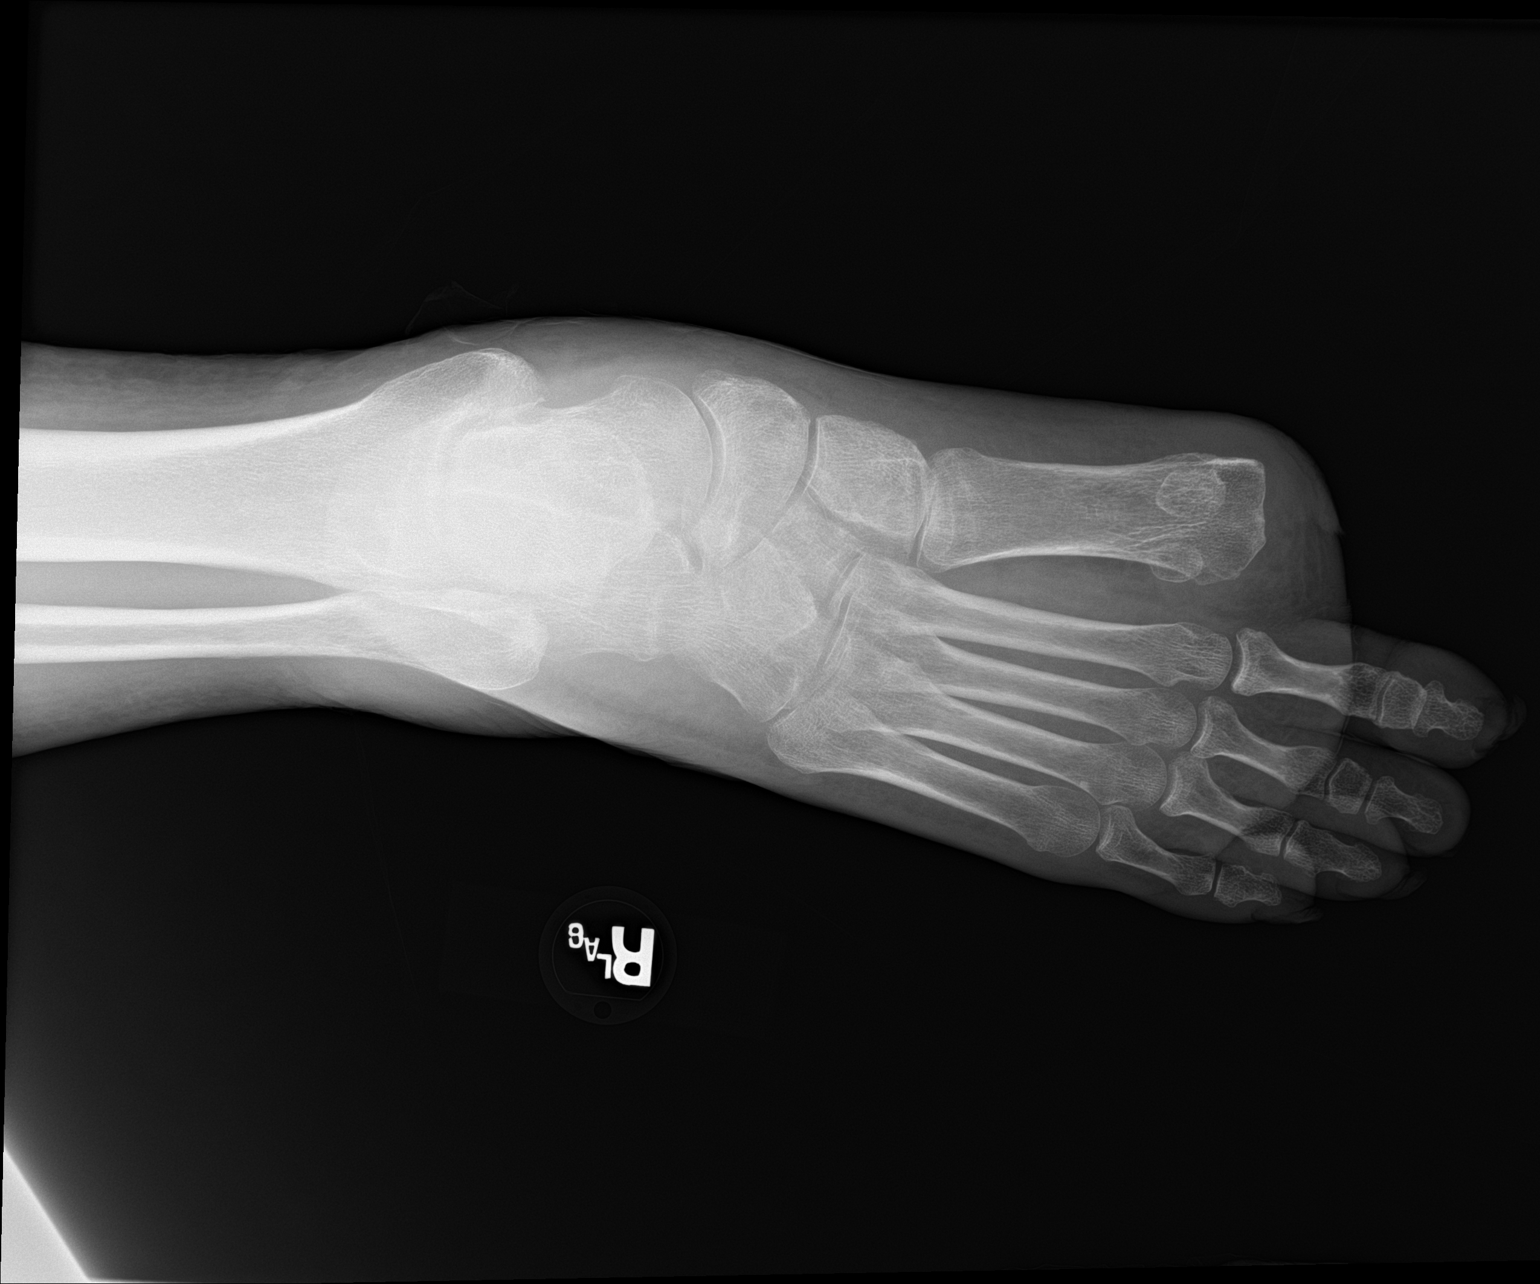

[foot obl]
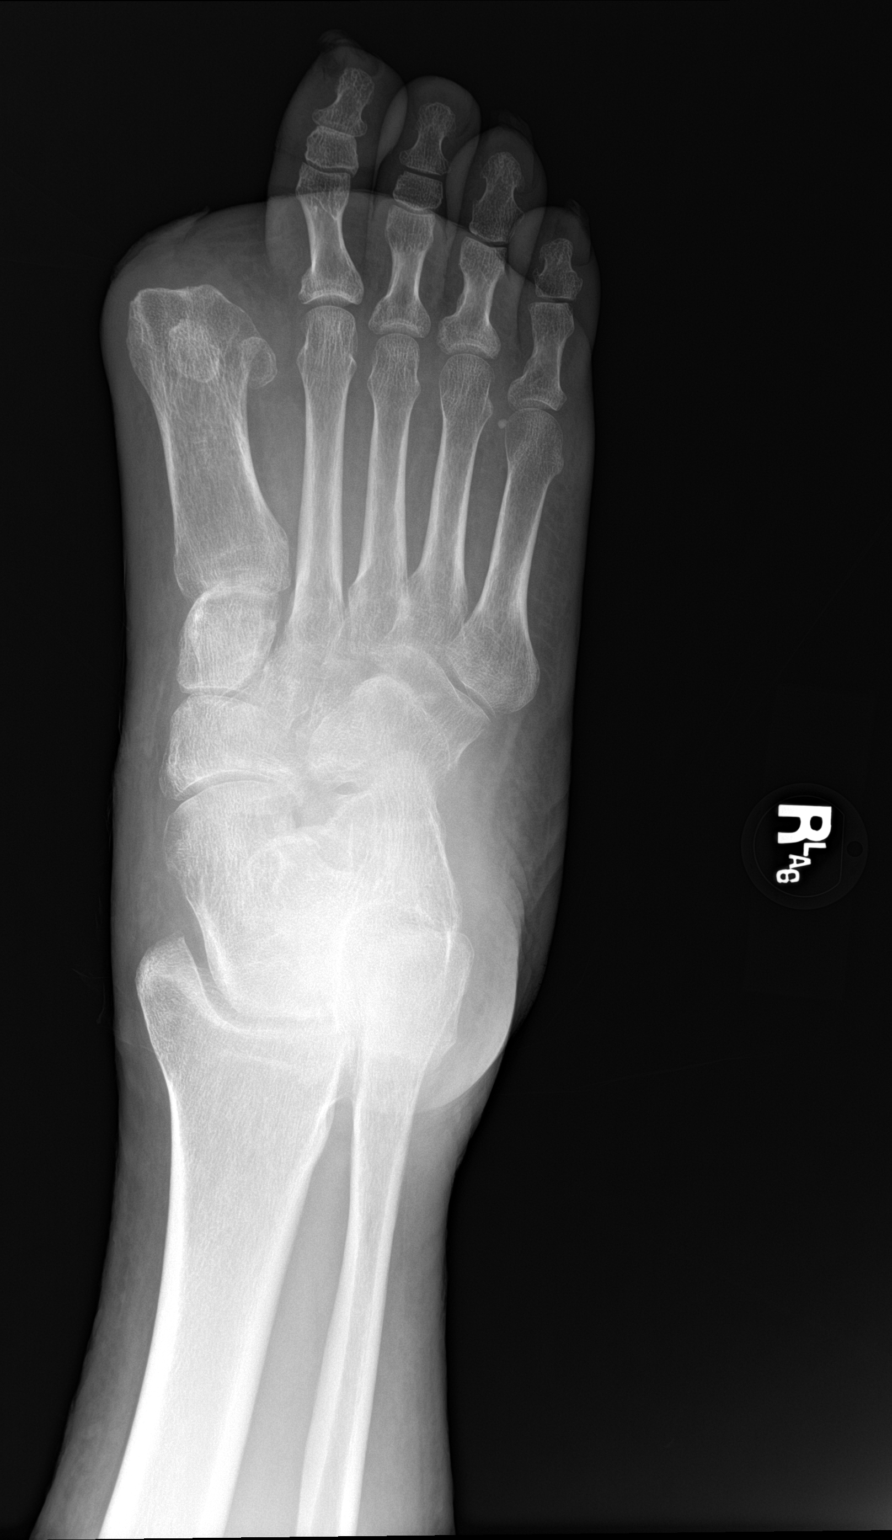

[foot lat]
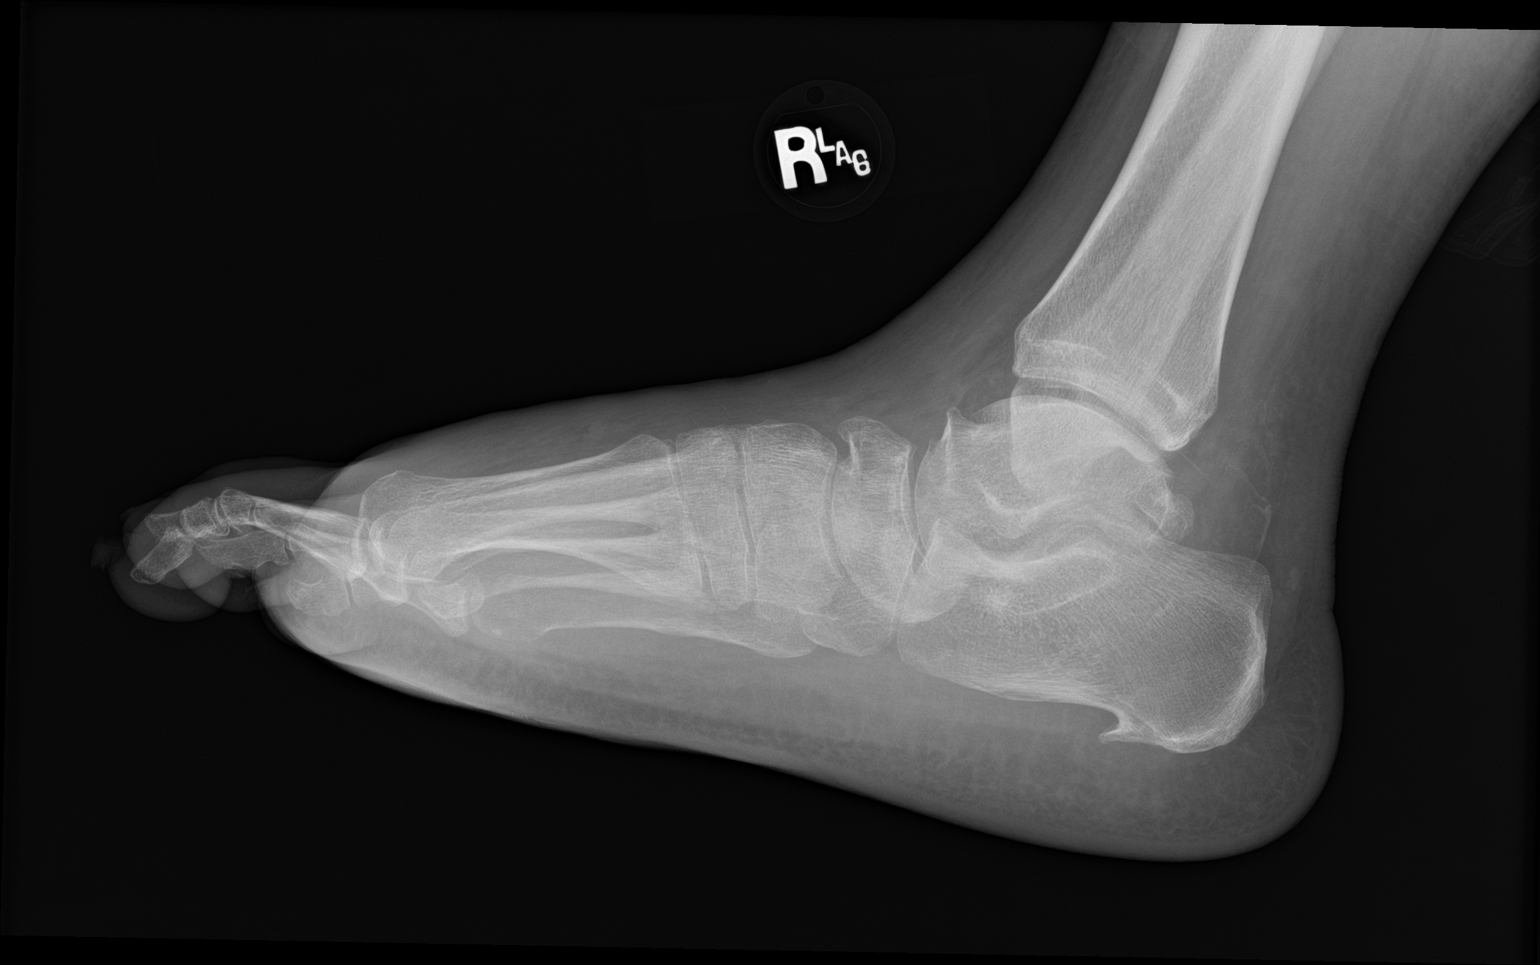

[3 of 3 positions shown; findings below may reference images not displayed]

FINDINGS: Post prior resection of the great toe. No erosion, periosteal
reaction, or bony destruction to suggest osteomyelitis. Spurring of
the dorsal talus. Plantar calcaneal spur. No fracture. Generalized
soft tissue edema. No soft tissue air or radiopaque foreign body.
IMPRESSION: 1. Generalized soft tissue edema. No soft tissue air or radiopaque
foreign body.
2. Post prior resection of the great toe. No radiographic evidence
of osteomyelitis.

## 2021-12-03 IMAGING — DX DG ANKLE COMPLETE 3+V*R*
3 series · 3 of 3 positions shown · non-contrast
Comparison: None.

CLINICAL DATA: Right foot pain. Foot and ankle cellulitis.
Swelling.

EXAM:
RIGHT ANKLE - COMPLETE 3+ VIEW

[ankle ap]
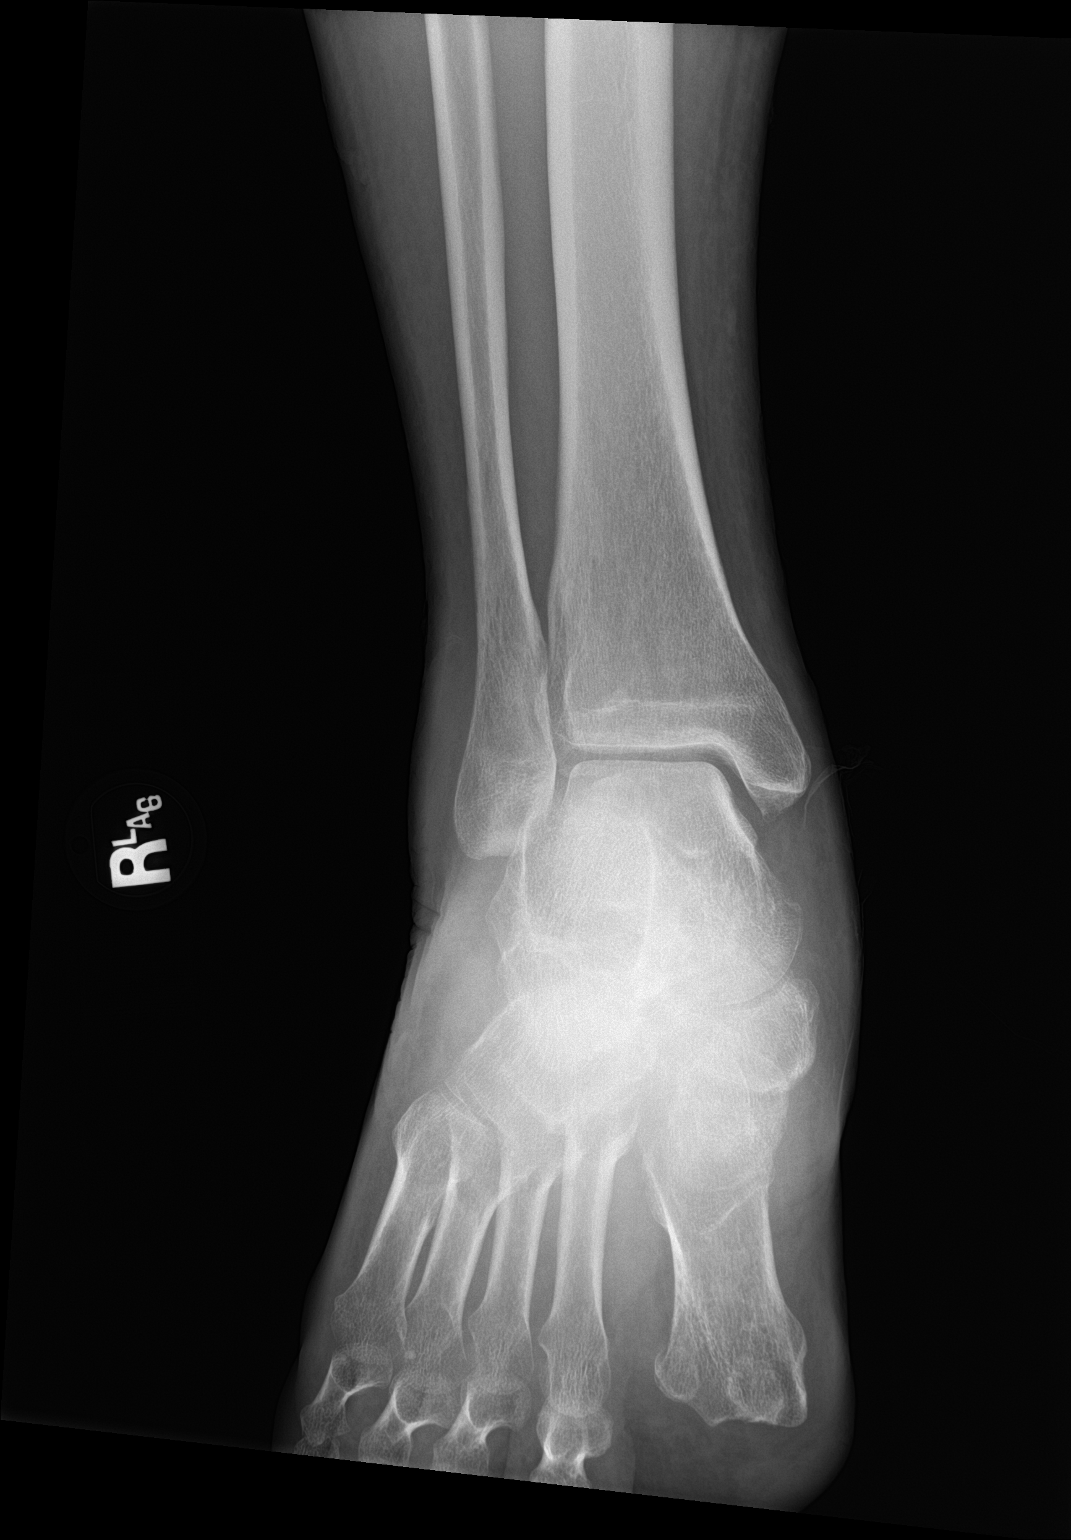

[ankle obl]
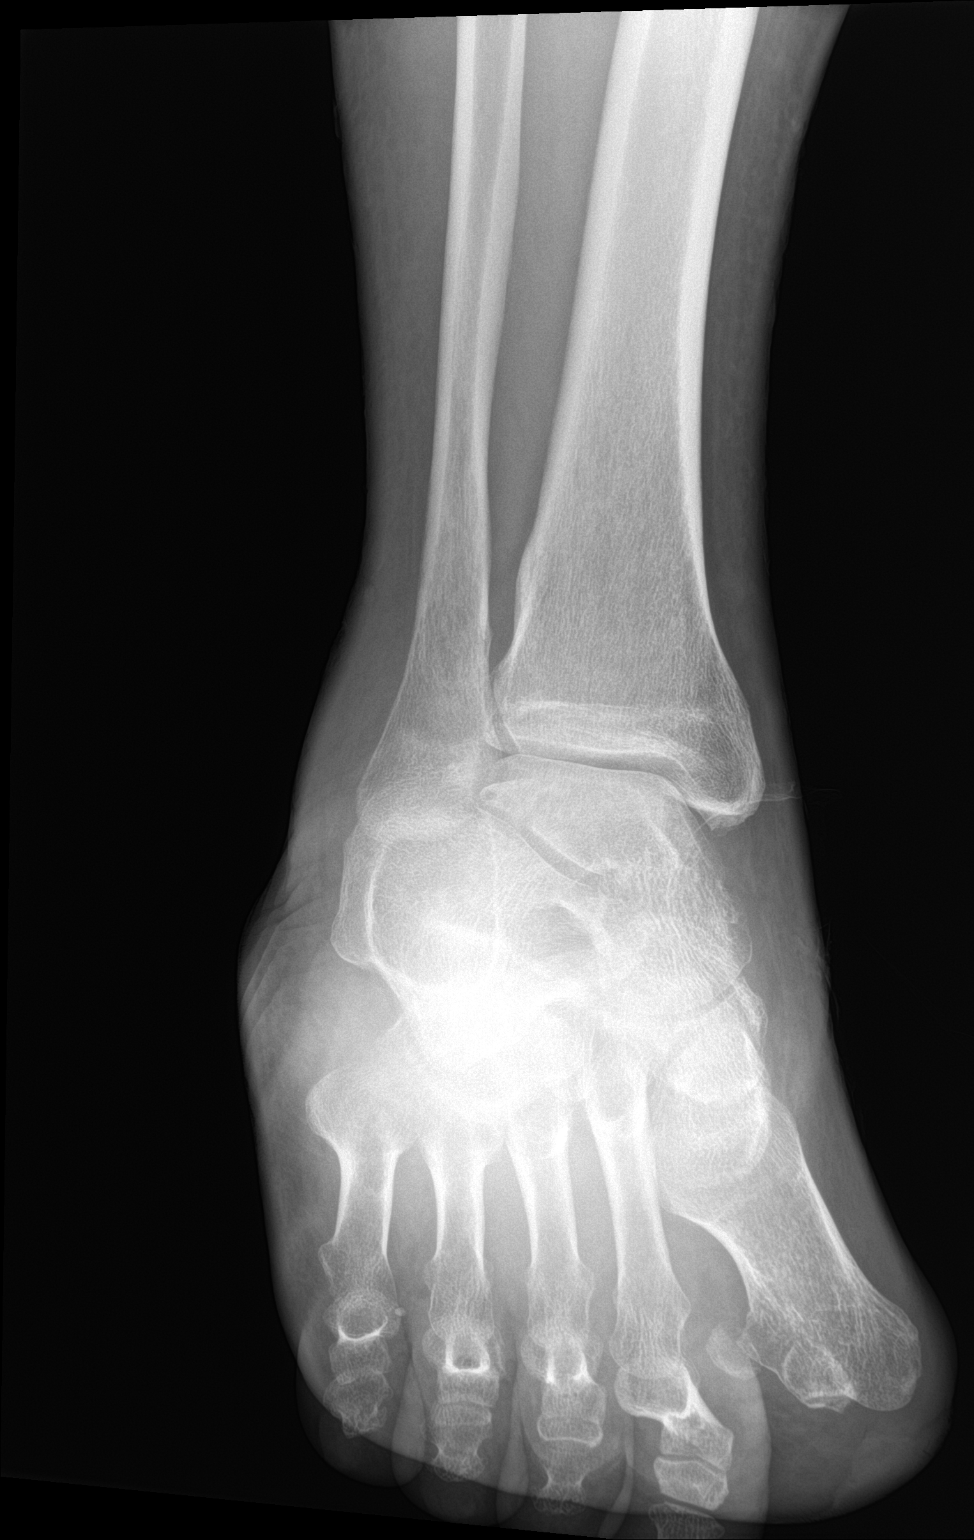

[ankle lat]
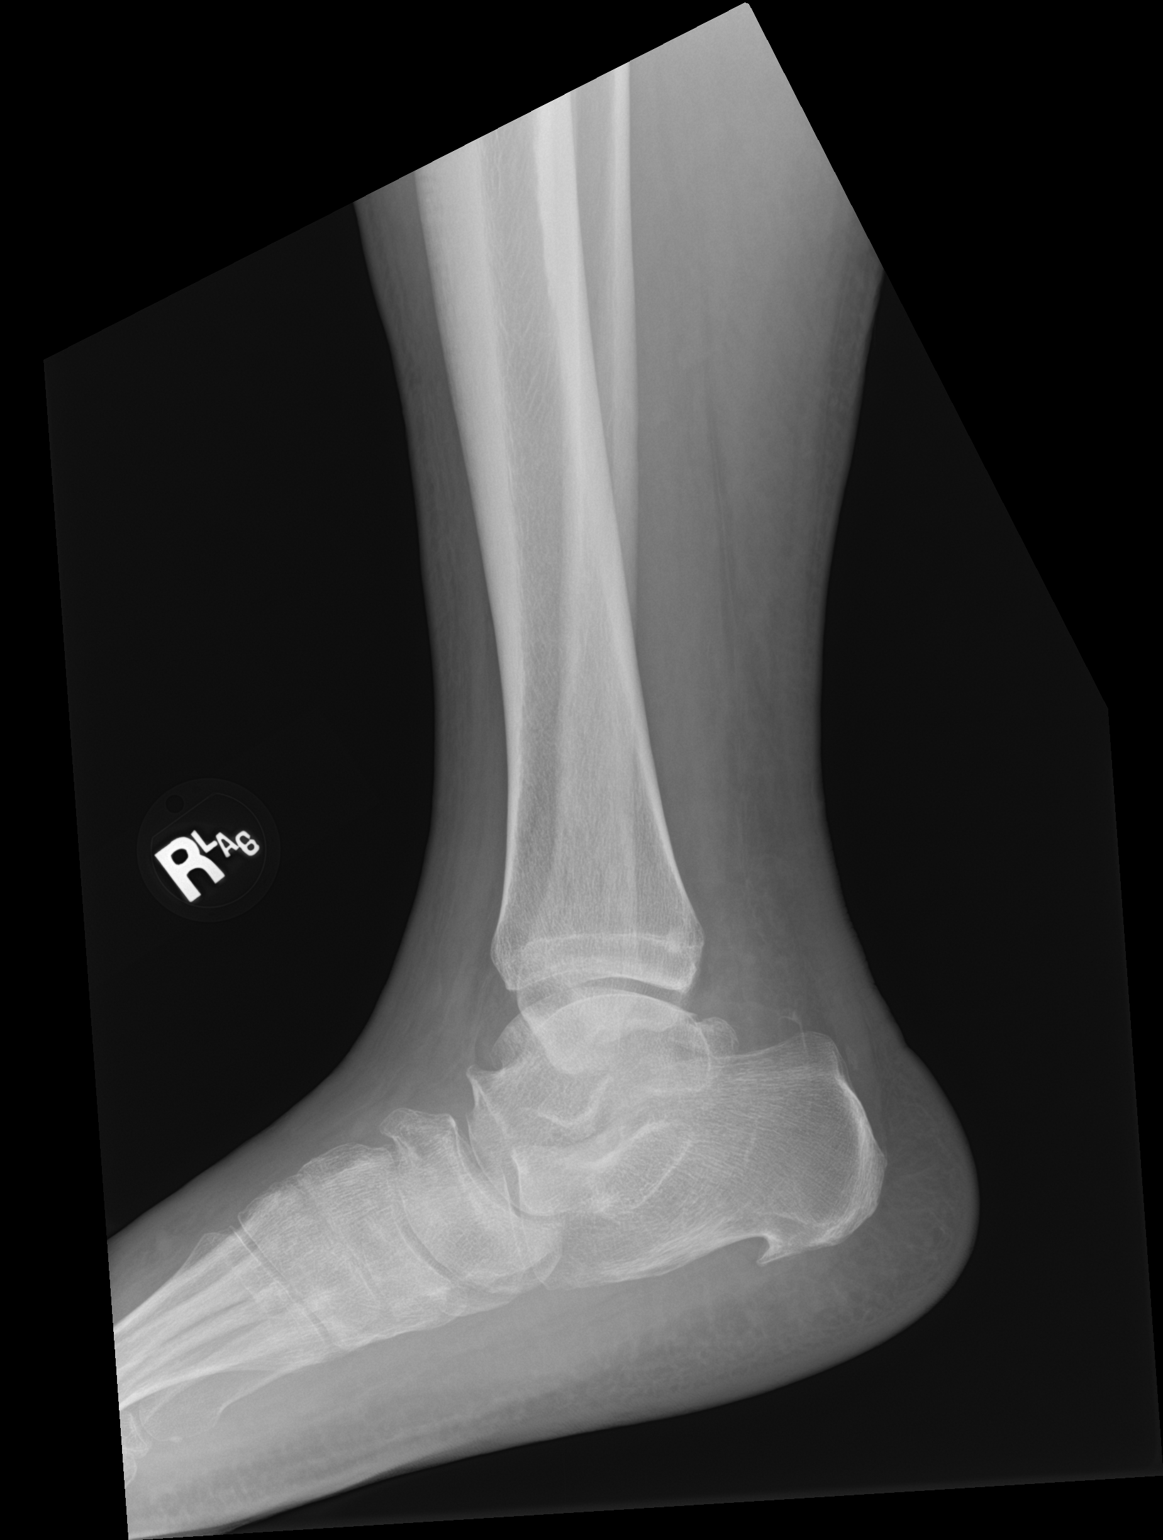

[3 of 3 positions shown; findings below may reference images not displayed]

FINDINGS: The ankle mortise is preserved. No fracture. No erosion, periosteal
reaction, or bone destruction. No ankle joint effusion. Prominent
talar ridge. Generalized soft tissue edema. No soft tissue air or
radiopaque foreign body.
IMPRESSION: Generalized soft tissue edema. No acute osseous abnormality or
evidence of osteomyelitis.

## 2021-12-03 MED ORDER — OLANZAPINE 5 MG PO TABS
5.0000 mg | ORAL_TABLET | Freq: Every day | ORAL | Status: DC
  Administered 2021-12-03 – 2021-12-08 (×6): 5 mg via ORAL
  Filled 2021-12-03 (×7): qty 1

## 2021-12-03 MED ORDER — MORPHINE SULFATE (PF) 4 MG/ML IV SOLN
4.0000 mg | Freq: Once | INTRAVENOUS | Status: AC
  Administered 2021-12-03: 4 mg via INTRAVENOUS
  Filled 2021-12-03: qty 1

## 2021-12-03 MED ORDER — SPIRONOLACTONE 12.5 MG HALF TABLET
12.5000 mg | ORAL_TABLET | Freq: Every day | ORAL | Status: DC
  Administered 2021-12-03 – 2021-12-08 (×6): 12.5 mg via ORAL
  Filled 2021-12-03 (×6): qty 1

## 2021-12-03 MED ORDER — MORPHINE SULFATE (PF) 4 MG/ML IV SOLN
4.0000 mg | INTRAVENOUS | Status: DC | PRN
  Administered 2021-12-03 (×2): 4 mg via INTRAVENOUS
  Filled 2021-12-03 (×2): qty 1

## 2021-12-03 MED ORDER — MORPHINE SULFATE (PF) 4 MG/ML IV SOLN
4.0000 mg | INTRAVENOUS | Status: AC | PRN
  Administered 2021-12-03 (×2): 4 mg via INTRAVENOUS
  Filled 2021-12-03 (×2): qty 1

## 2021-12-03 MED ORDER — FUROSEMIDE 10 MG/ML IJ SOLN
40.0000 mg | Freq: Once | INTRAMUSCULAR | Status: AC
  Administered 2021-12-03: 40 mg via INTRAVENOUS
  Filled 2021-12-03: qty 4

## 2021-12-03 MED ORDER — POLYETHYLENE GLYCOL 3350 17 G PO PACK: 17.0000 g | PACK | Freq: Every day | ORAL | Status: DC | PRN

## 2021-12-03 MED ORDER — ENOXAPARIN SODIUM 40 MG/0.4ML IJ SOSY
40.0000 mg | PREFILLED_SYRINGE | INTRAMUSCULAR | Status: DC
  Administered 2021-12-04 – 2021-12-05 (×2): 40 mg via SUBCUTANEOUS
  Filled 2021-12-03 (×3): qty 0.4

## 2021-12-03 MED ORDER — ATORVASTATIN CALCIUM 40 MG PO TABS
40.0000 mg | ORAL_TABLET | Freq: Every day | ORAL | Status: DC
  Administered 2021-12-03 – 2021-12-08 (×6): 40 mg via ORAL
  Filled 2021-12-03 (×6): qty 1

## 2021-12-03 MED ORDER — MORPHINE SULFATE (PF) 2 MG/ML IV SOLN
2.0000 mg | INTRAVENOUS | Status: AC | PRN
  Administered 2021-12-03 – 2021-12-04 (×2): 2 mg via INTRAVENOUS
  Filled 2021-12-03 (×2): qty 1

## 2021-12-03 MED ORDER — CARVEDILOL 3.125 MG PO TABS
3.1250 mg | ORAL_TABLET | Freq: Two times a day (BID) | ORAL | Status: DC
  Administered 2021-12-03 – 2021-12-05 (×4): 3.125 mg via ORAL
  Filled 2021-12-03 (×4): qty 1

## 2021-12-03 MED ORDER — SACUBITRIL-VALSARTAN 24-26 MG PO TABS
1.0000 | ORAL_TABLET | Freq: Two times a day (BID) | ORAL | Status: DC
  Administered 2021-12-03 – 2021-12-09 (×13): 1 via ORAL
  Filled 2021-12-03 (×14): qty 1

## 2021-12-03 MED ORDER — ALBUTEROL SULFATE (2.5 MG/3ML) 0.083% IN NEBU: 2.5000 mg | INHALATION_SOLUTION | Freq: Four times a day (QID) | RESPIRATORY_TRACT | Status: DC | PRN

## 2021-12-03 MED ORDER — HYDRALAZINE HCL 20 MG/ML IJ SOLN: 10.0000 mg | Freq: Four times a day (QID) | INTRAMUSCULAR | Status: DC | PRN

## 2021-12-03 MED ORDER — MORPHINE SULFATE (PF) 2 MG/ML IV SOLN: 2.0000 mg | INTRAVENOUS | Status: AC | PRN

## 2021-12-03 MED ORDER — INSULIN ASPART 100 UNIT/ML IJ SOLN
0.0000 [IU] | Freq: Three times a day (TID) | INTRAMUSCULAR | Status: DC
  Administered 2021-12-03 (×2): 2 [IU] via SUBCUTANEOUS
  Filled 2021-12-03: qty 0.15

## 2021-12-03 MED ORDER — LEVETIRACETAM 500 MG PO TABS
500.0000 mg | ORAL_TABLET | Freq: Two times a day (BID) | ORAL | Status: DC
  Filled 2021-12-03 (×7): qty 1

## 2021-12-03 MED ORDER — MORPHINE SULFATE (PF) 2 MG/ML IV SOLN: 2.0000 mg | INTRAVENOUS | Status: DC | PRN

## 2021-12-03 NOTE — Assessment & Plan Note (Signed)
.   Resume patients home regimen of oral antihypertensives . Titrate antihypertensive regimen as necessary to achieve adequate BP control . PRN intravenous antihypertensives for excessively elevated blood pressure   

## 2021-12-03 NOTE — ED Notes (Signed)
Pt given sandwich per his request.

## 2021-12-03 NOTE — Consult Note (Signed)
WOC Nurse Consult Note: Reason for Consult: Patient seen immediately following Dr. Jerolyn Center visit. See photos she placed in the EHR under the media tab.  Patient consult recently performed remotely by this writer prior to ulcerations on RLE. Wound type: Venous insufficiency (no ABI that I can see), infection Pressure Injury POA: Yes/No/NA Measurement:To be obtained by Bedside RN with first wound care today Wound bed: Red, moist Drainage (amount, consistency, odor) dried serum at wound periphery Periwound: edema, erythema, warmth Dressing procedure/placement/frequency: I will place guidance for Nursing via the Orders for twice daily care to the RLE using an antimicrobial nonadherent (Xeroform) for 3 days and then decrease the topical care to once daily. Elevation and protection using a Prevalon boot is recommended; I have ordered a boot for the right foot today.  Previously the patient has refused topical care at intervals and I know there are post acute challenges with housing. I discussed with Dr. Jerolyn Center the rationale for not using an Unna's Boot at this time due to the need to see the wounds and their response to treatment more often than the boot change frequency and also, should he be discharged with one on, the risk of skin injury and potential for fungal overgrowth, etc.  Bedside RN is sent a Secure Chat with POC rationale, and request for measurement of RLE wounds.  WOC nursing team will not follow, but will remain available to this patient, the nursing and medical teams.  Please re-consult if needed. Thanks, Ladona Mow, MSN, RN, GNP, [facility]  Pager# [phone]

## 2021-12-03 NOTE — Assessment & Plan Note (Signed)
?   No clinical evidence of cardiogenic volume overload ?? Continue home regimen of diuretics ? ?

## 2021-12-03 NOTE — ED Notes (Signed)
PT given meal tray.

## 2021-12-03 NOTE — Assessment & Plan Note (Signed)
·   Patient admits to regular cocaine use, last being just several days ago  Counseling daily on cessation

## 2021-12-03 NOTE — ED Notes (Signed)
Patient requesting additional pain medication. Provider paged.

## 2021-12-03 NOTE — ED Provider Notes (Signed)
[facility]-EMERGENCY DEPT Provider Note   CSN: [id number] Arrival date & time: 12/02/21  1639     History  Chief Complaint  Patient presents with   Foot Pain    [name] is a 56 y.o. male.  Patient is a 56 year old male with history of poorly controlled/noncompliant diabetes, hypertension, schizophrenia, congestive heart failure, and homelessness.  He has also had amputations of both great toes secondary to osteomyelitis.  Patient presenting today for evaluation of right foot and ankle pain.  This appears to be a chronic problem for him, but is worsening over the past several weeks.  He has been seen here on multiple occasions with similar complaints.  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  He was seen 1 week ago and prescribed doxycycline.  He reports finishing this medication yesterday, but pain is worsening.  He denies to me he is having any fevers or chills.  He denies any chest pain or difficulty breathing   The history is provided by the patient.      Home Medications Prior to Admission medications   Medication Sig Start Date End Date Taking? Authorizing Provider  albuterol (VENTOLIN HFA) 108 (90 Base) MCG/ACT inhaler Inhale 2 puffs into the lungs every 6 (six) hours as needed for wheezing or shortness of breath. 11/09/21   Terald Sleeper, MD  atorvastatin (LIPITOR) 10 MG tablet Take 4 tablets (40 mg total) by mouth daily. 11/09/21   Terald Sleeper, MD  bacitracin ointment Apply topically 2 (two) times daily. Patient not taking: Reported on 11/23/2021 11/21/21   Drema Dallas, MD  carbamide peroxide (DEBROX) 6.5 % OTIC solution Place 5 drops into both ears twice daily. Patient not taking: Reported on 11/23/2021 11/04/21   Maryagnes Amos, FNP  carvedilol (COREG) 3.125 MG tablet Take 1 tablet (3.125 mg total) by mouth 2 (two) times daily with a meal. 11/09/21   Trifan, Kermit Balo, MD  diphenhydrAMINE-zinc acetate (BENADRYL) cream  Apply topically 3 (three) times daily as needed for itching. Apply to affected area Patient not taking: Reported on 11/23/2021 11/21/21   Drema Dallas, MD  doxycycline (VIBRAMYCIN) 100 MG capsule Take 1 capsule (100 mg total) by mouth 2 (two) times daily. Patient not taking: Reported on 11/24/2021 11/23/21   Petrucelli, Samantha R, PA-C  hydrocerin (EUCERIN) CREA Apply 1 application topically daily. Patient not taking: Reported on 11/25/2021 11/22/21   Drema Dallas, MD  levETIRAcetam (KEPPRA) 500 MG tablet Take 1 tablet (500 mg total) by mouth 2 (two) times daily. 11/09/21 12/09/21  Terald Sleeper, MD  OLANZapine (ZYPREXA) 5 MG tablet Take 1 tablet (5 mg total) by mouth daily. 11/25/21   Laveda Abbe, NP  oxyCODONE (ROXICODONE) 15 MG immediate release tablet Take 15 mg by mouth every 4 (four) hours as needed for pain. Patient not taking: Reported on 11/25/2021    [provider]  pregabalin (LYRICA) 100 MG capsule Take 100 mg by mouth 3 (three) times daily. Patient not taking: Reported on 11/02/2021    [provider]  sacubitril-valsartan (ENTRESTO) 24-26 MG Take 1 tablet by mouth 2 (two) times daily. 11/09/21 12/09/21  Terald Sleeper, MD  spironolactone (ALDACTONE) 25 MG tablet Take 1/2 tablets (12.5 mg total) by mouth daily. 11/09/21   Terald Sleeper, MD  traMADol (ULTRAM) 50 MG tablet Take 1 tablet (50 mg total) by mouth every 12 (twelve) hours as needed for up to 10 doses. 11/09/21   Trifan,  Kermit Balo, MD  umeclidinium bromide (INCRUSE ELLIPTA) 62.5 MCG/INH AEPB Inhale 1 puff into the lungs daily. Patient not taking: Reported on 11/24/2021 11/16/20   Joycelyn Das, MD      Allergies    Ibuprofen, Acetaminophen, Lyrica cr [pregabalin er], and Peanut-containing drug products    Review of Systems   Review of Systems  All other systems reviewed and are negative.  Physical Exam Updated Vital Signs BP (!) 188/98 (BP Location: Left Arm)    Pulse 100     Temp 97.8 F (36.6 C) (Oral)    Resp 18    SpO2 98%  Physical Exam Vitals and nursing note reviewed.  Constitutional:      General: He is not in acute distress.    Appearance: He is well-developed. He is not diaphoretic.  HENT:     Head: Normocephalic and atraumatic.  Cardiovascular:     Rate and Rhythm: Normal rate and regular rhythm.     Heart sounds: No murmur heard.   No friction rub.  Pulmonary:     Effort: Pulmonary effort is normal. No respiratory distress.     Breath sounds: Normal breath sounds. No wheezing or rales.  Abdominal:     General: Bowel sounds are normal. There is no distension.     Palpations: Abdomen is soft.     Tenderness: There is no abdominal tenderness.  Musculoskeletal:        General: Normal range of motion.     Cervical back: Normal range of motion and neck supple.     Comments: The right lower extremity has tissue breakdown to the medial aspect of the ankle and extending into the foot.  It is exquisitely tender to the touch.  Skin:    General: Skin is warm and dry.  Neurological:     Mental Status: He is alert and oriented to person, place, and time.     Coordination: Coordination normal.        ED Results / Procedures / Treatments   Labs (all labs ordered are listed, but only abnormal results are displayed) Labs Reviewed  CBC WITH DIFFERENTIAL/PLATELET - Abnormal; Notable for the following components:      Result Value   RBC 4.00 (*)    Hemoglobin 12.9 (*)    HCT 38.0 (*)    All other components within normal limits  BASIC METABOLIC PANEL - Abnormal; Notable for the following components:   CO2 21 (*)    Calcium 8.6 (*)    All other components within normal limits  LACTIC ACID, PLASMA  LACTIC ACID, PLASMA    EKG None  Radiology No results found.  Procedures Procedures    Medications Ordered in ED Medications  morphine 4 MG/ML injection 4 mg (has no administration in time range)  furosemide (LASIX) injection 40 mg (has no  administration in time range)    ED Course/ Medical Decision Making/ A&P  This patient presents to the ED for concern of right foot and ankle pain, this involves an extensive number of treatment options, and is a complaint that carries with it a high risk of complications and morbidity.  The differential diagnosis includes ischemic limb, cellulitis, CHF exacerbation, osteomyelitis   Co morbidities that complicate the patient evaluation  Diabetes and congestive heart failure   Additional history obtained:  No additional history obtained from others No external records from outside source obtained and reviewed   Lab Tests:  I Ordered, and personally interpreted labs.  The pertinent results  include: CBC and basic metabolic panel essentially unremarkable   Imaging Studies ordered:  I ordered imaging studies including x-rays of the foot and ankle I independently visualized and interpreted imaging which showed soft tissue swelling, but no evidence for osteomyelitis I agree with the radiologist interpretation   Cardiac Monitoring:  No cardiac monitoring indicated   Medicines ordered and prescription drug management:  I ordered medication including morphine for pain Reevaluation of the patient after these medicines showed that the patient improved I have reviewed the patients home medicines and have made adjustments as needed   Test Considered:  No other test considered, ordered, or indicated   Critical Interventions:  No critical interventions performed other than admission   Consultations Obtained:  I requested consultation with the hospitalist,  and discussed lab and imaging findings as well as pertinent plan - they recommend: Admission for IV antibiotics and wound care   Problem List / ED Course:  Patient with history of chronic leg wounds presenting with complaints of worsening pain and physical appearance of the leg.  X-rays show no evidence for  osteomyelitis, but cellulitis/sores appear to be worsening.  I feel as though patient has failed outpatient therapy and is now requiring IV pain medications and justifies admission to the hospital   Reevaluation:  After the interventions noted above, I reevaluated the patient and found that they have :improved   Social Determinants of Health:  Homelessness and medical noncompliance   Dispostion:  After consideration of the diagnostic results and the patients response to treatment, I feel that the patent would benefit from admission for IV antibiotics and wound care..    Final Clinical Impression(s) / ED Diagnoses Final diagnoses:  None    Rx / DC Orders ED Discharge Orders     None         Geoffery Lyons, MD 12/03/21 [phone]

## 2021-12-03 NOTE — Progress Notes (Signed)
This is a 56 year old homeless male with multiple comorbidities admitted with foot pain.  He was just admitted by my partner.  She has been in and out of the hospital multiple times.  He has history of type 2 diabetes polyneuropathy heart failure cocaine abuse tobacco abuse bipolar disorder hypertension hyperlipidemia. Patient has chronic  lower extremity venous insufficiency and wounds. Appreciate wound care input. Dressing procedure/placement/frequency: I will place guidance for Nursing via the Orders for twice daily care to the RLE using an antimicrobial nonadherent (Xeroform) for 3 days and then decrease the topical care to once daily. Elevation and protection using a Prevalon boot is recommended; I have ordered a boot for the right foot today.  Previously the patient has refused topical care at intervals and I know there are post acute challenges with housing. I discussed with Dr. Rodena Piety the rationale for not using an Unna's Boot at this time due to the need to see the wounds and their response to treatment more often than the boot change frequency and also, should he be discharged with one on, the risk of skin injury and potential for fungal overgrowth, etc.  His procalcitonin is low and CRP is 3.3 and no evidence of active infection noted.  White count is normal he is afebrile.  We will hold off on pill placing him on antibiotics at this time.  Will consult TOC for discharge planning.

## 2021-12-03 NOTE — ED Notes (Signed)
Patient given meal tray.

## 2021-12-03 NOTE — Assessment & Plan Note (Signed)
.   Continuing home regimen of lipid lowering therapy.  

## 2021-12-03 NOTE — Assessment & Plan Note (Signed)
•   Patient is currently chest pain free °• Monitoring patient on telemetry °• Continue home regimen of antiplatelet therapy, lipid lowering therapy and AV nodal blocking therapy ° °

## 2021-12-03 NOTE — Assessment & Plan Note (Signed)
.   Patient is currently chest pain free . Monitoring patient on telemetry . Continue home regimen of  lipid lowering therapy and AV nodal blocking therapy  

## 2021-12-03 NOTE — ED Notes (Signed)
MD made aware patient has refused lovenox and keppra today.

## 2021-12-03 NOTE — ED Notes (Signed)
Wound to R LE cleansed and dressed.

## 2021-12-03 NOTE — Assessment & Plan Note (Addendum)
·   Patient presenting with severe pain of the bilateral lower extremities, right greater than left that has been persisting despite completing a course of antibiotics for suspected cellulitis during his most recent hospitalization  Admits to not having dressed his wounds even 1 time since discharge in mid December  Patient has already had follow-up arranged on 1/6 but insist that he cannot wait that long and that the pain is too intolerable hence his presentation  ER provider requested admission due to concerns for recurrent cellulitis.    That being said I cannot find compelling evidence for ongoing cellulitis.  Patient does not possess a white count or fever.  There is no active drainage from any of the patient's wounds nor is there any significant induration redness or warmth surrounding the wounds themselves.  Obtaining procalcitonin and CRP and if these are elevated will consider initiation of antibiotic therapy.  Patient's pain has a primary component which may have been exacerbated somewhat by further breakdown of the wounds.  Will provide patient with a limited supply of opiates but I have told him that this will be discontinued after several doses.  Reordering dressing changes of the wounds as instructed by wound care nurses during last hospitalization with Xeroform dressings to the open wounds wrapped with Kerlix and secured with tape.  Ordering Case management consultation for assistance and arranging for a safe disposition for this patient

## 2021-12-03 NOTE — H&P (Signed)
History and Physical    [name] [id number] DOB: [birth year] DOA: 12/02/2021  PCP: Pcp, No  Patient coming from: Home via EMS   Chief Complaint:  Chief Complaint  Patient presents with   Foot Pain     HPI:    56 year old homeless male with past medical history of non-insulin dependent diabetes mellitus type 2 (Hgb A1C 5.8% 12/14), diabetic polyneuropathy, systolic and diastolic congestive heart failure (Echo 12/2020 EF < 20% with G2DD), coronary artery disease (Cath 10/2019 showing diffuse multivessel disease), cocaine abuse, nicotine dependence, bipolar disorder, hyperlipidemia, hypertension presented to [facility] emergency department via EMS complaining of bilateral leg pain.  Of note, patient was recently hospitalized at [facility] hospital from 12/14 until 12/22 for right lower extremity cellulitis.  Patient completed a course of antibiotic treatment.  Patient received wound care throughout the hospitalization.  Patient was discharged on 12/22 and arrangements were made for outpatient follow-up on 1/6.  Patient explains that since discharge she has experienced progressively worsening pain of the bilateral lower extremities, right greater than left.  Patient describes the pain as severe in intensity, sharp in quality, radiating proximally, worse with weightbearing or moving the affected extremities.  Patient also explains that the ulcerations of his legs seem to be worsening as of late.  Patient denies any associated fever, weakness or loss of appetite.  Patient's pain became so severe that the patient contacted EMS who promptly came to evaluate the patient and brought him into [facility] emergency department for evaluation.  Upon evaluation in the emergency department ER provider felt patient was clinically developing recurrent soft tissue infection of the legs.  Several doses of intravenous morphine were administered due to ongoing severe pain.  The hospitalist group  was then called to assess the patient for admission to the hospital.  .  Review of Systems:   Review of Systems  Musculoskeletal:  Positive for joint pain and myalgias.   Past Medical History:  Diagnosis Date   Asthma    Bipolar 1 disorder ([place])    CHF (congestive heart failure) (HCC)    COPD (chronic obstructive pulmonary disease) ([place])    Depression    Diabetes mellitus without complication (HCC)    GERD (gastroesophageal reflux disease)    Homelessness    Hypertension    QT prolongation    Schizoaffective disorder  [facility])     Past Surgical History:  Procedure Laterality Date   AMPUTATION TOE Bilateral 01/02/2021   Procedure: AMPUTATION TOE;  Surgeon: Evelina Bucy, DPM;  Location: [place];  Service: Podiatry;  Laterality: Bilateral;  Ok to leave patient on hospital bed   IR ANGIO INTRA EXTRACRAN SEL COM CAROTID INNOMINATE BILAT MOD SED  12/17/2020   IR ANGIO VERTEBRAL SEL VERTEBRAL BILAT MOD SED  12/17/2020   RADIOLOGY WITH ANESTHESIA N/A 12/17/2020   Procedure: IR WITH ANESTHESIA;  Surgeon: Radiologist, Medication, MD;  Location: [place];  Service: Radiology;  Laterality: N/A;     reports that he has been smoking cigarettes. He has never used smokeless tobacco. He reports current alcohol use. He reports current drug use. Drugs: Cocaine, Marijuana, and "Crack" cocaine.  Allergies  Allergen Reactions   Ibuprofen Anaphylaxis   Acetaminophen Hives, Nausea And Vomiting and Rash    Pt overdosed on bottle of APAP 05/06/16    Lyrica Cr [Pregabalin Er] Other (See Comments)    Dystonia and dysarthria   Peanut-Containing Drug Products Other (See Comments)    Pt denies allergy to peanut butter  03/02/2021    Family History  Problem Relation Age of Onset   Drug abuse Mother    Suicidality Mother      Prior to Admission medications   Medication Sig Start Date End Date Taking? Authorizing Provider  albuterol (VENTOLIN HFA) 108 (90 Base) MCG/ACT inhaler Inhale 2 puffs into the lungs  every 6 (six) hours as needed for wheezing or shortness of breath. 11/09/21   Wyvonnia Dusky, MD  atorvastatin (LIPITOR) 10 MG tablet Take 4 tablets (40 mg total) by mouth daily. 11/09/21   Wyvonnia Dusky, MD  bacitracin ointment Apply topically 2 (two) times daily. Patient not taking: Reported on 11/23/2021 11/21/21   Allie Bossier, MD  carbamide peroxide (DEBROX) 6.5 % OTIC solution Place 5 drops into both ears twice daily. Patient not taking: Reported on 11/23/2021 11/04/21   Suella Broad, FNP  carvedilol (COREG) 3.125 MG tablet Take 1 tablet (3.125 mg total) by mouth 2 (two) times daily with a meal. 11/09/21   Trifan, Carola Rhine, MD  diphenhydrAMINE-zinc acetate (BENADRYL) cream Apply topically 3 (three) times daily as needed for itching. Apply to affected area Patient not taking: Reported on 11/23/2021 11/21/21   Allie Bossier, MD  doxycycline (VIBRAMYCIN) 100 MG capsule Take 1 capsule (100 mg total) by mouth 2 (two) times daily. Patient not taking: Reported on 11/24/2021 11/23/21   Petrucelli, Samantha R, PA-C  hydrocerin (EUCERIN) CREA Apply 1 application topically daily. Patient not taking: Reported on 11/25/2021 11/22/21   Allie Bossier, MD  levETIRAcetam (KEPPRA) 500 MG tablet Take 1 tablet (500 mg total) by mouth 2 (two) times daily. 11/09/21 12/09/21  Wyvonnia Dusky, MD  OLANZapine (ZYPREXA) 5 MG tablet Take 1 tablet (5 mg total) by mouth daily. 11/25/21   Ethelene Hal, NP  oxyCODONE (ROXICODONE) 15 MG immediate release tablet Take 15 mg by mouth every 4 (four) hours as needed for pain. Patient not taking: Reported on 11/25/2021    [provider]  pregabalin (LYRICA) 100 MG capsule Take 100 mg by mouth 3 (three) times daily. Patient not taking: Reported on 11/02/2021    [provider]  sacubitril-valsartan (ENTRESTO) 24-26 MG Take 1 tablet by mouth 2 (two) times daily. 11/09/21 12/09/21  Wyvonnia Dusky, MD  spironolactone (ALDACTONE)  25 MG tablet Take 1/2 tablets (12.5 mg total) by mouth daily. 11/09/21   Wyvonnia Dusky, MD  traMADol (ULTRAM) 50 MG tablet Take 1 tablet (50 mg total) by mouth every 12 (twelve) hours as needed for up to 10 doses. 11/09/21   Wyvonnia Dusky, MD  umeclidinium bromide (INCRUSE ELLIPTA) 62.5 MCG/INH AEPB Inhale 1 puff into the lungs daily. Patient not taking: Reported on 11/24/2021 11/16/20   Flora Lipps, MD    Physical Exam: Vitals:   12/03/21 0600 12/03/21 0605 12/03/21 0610 12/03/21 0615  BP: (!) 143/104   (!) 140/93  Pulse: (!) 101 91 91 95  Resp:    19  Temp:      TempSrc:      SpO2: 95% 96% 92% 93%   Constitutional: Patient is in distress due to bilateral lower extremity pain.  Awake alert and oriented x3, no associated distress.   Skin: no rashes, no lesions, good skin turgor noted. Eyes: Pupils are equally reactive to light.  No evidence of scleral icterus or conjunctival pallor.  ENMT: Moist mucous membranes noted.  Posterior pharynx clear of any exudate or lesions.   Neck: normal, supple, no masses, no  thyromegaly.  No evidence of jugular venous distension.   Respiratory: clear to auscultation bilaterally, no wheezing, no crackles. Normal respiratory effort. No accessory muscle use.  Cardiovascular: Regular rate and rhythm, no murmurs / rubs / gallops. No extremity edema. 2+ pedal pulses. No carotid bruits.  Chest:   Nontender without crepitus or deformity.   Back:   Nontender without crepitus or deformity. Abdomen: Abdomen is soft and nontender.  No evidence of intra-abdominal masses.  Positive bowel sounds noted in all quadrants.   Musculoskeletal: Exquisite tenderness with palpation of the distal bilateral lower extremities, particularly surrounding the areas of skin breakdown as noted above.  Patient is status post bilateral great toe amputations.    Neurologic: CN 2-12 grossly intact. Sensation intact.  Patient moving all 4 extremities spontaneously.  Patient is  following all commands.  Patient is responsive to verbal stimuli.   Psychiatric: Patient exhibits normal mood with appropriate affect.  Patient seems to possess insight as to their current situation.         Labs on Admission: I have personally reviewed following labs and imaging studies -   CBC: Recent Labs  Lab 12/02/21 1706  WBC 6.7  NEUTROABS 3.7  HGB 12.9*  HCT 38.0*  MCV 95.0  PLT 354   Basic Metabolic Panel: Recent Labs  Lab 12/02/21 1706  NA 136  K 3.6  CL 107  CO2 21*  GLUCOSE 77  BUN 12  CREATININE 0.91  CALCIUM 8.6*   GFR: Estimated Creatinine Clearance: 104.5 mL/min (by C-G formula based on SCr of 0.91 mg/dL). Liver Function Tests: No results for input(s): AST, ALT, ALKPHOS, BILITOT, PROT, ALBUMIN in the last 168 hours. No results for input(s): LIPASE, AMYLASE in the last 168 hours. No results for input(s): AMMONIA in the last 168 hours. Coagulation Profile: No results for input(s): INR, PROTIME in the last 168 hours. Cardiac Enzymes: No results for input(s): CKTOTAL, CKMB, CKMBINDEX, TROPONINI in the last 168 hours. BNP (last 3 results) No results for input(s): PROBNP in the last 8760 hours. HbA1C: No results for input(s): HGBA1C in the last 72 hours. CBG: No results for input(s): GLUCAP in the last 168 hours. Lipid Profile: No results for input(s): CHOL, HDL, LDLCALC, TRIG, CHOLHDL, LDLDIRECT in the last 72 hours. Thyroid Function Tests: No results for input(s): TSH, T4TOTAL, FREET4, T3FREE, THYROIDAB in the last 72 hours. Anemia Panel: No results for input(s): VITAMINB12, FOLATE, FERRITIN, TIBC, IRON, RETICCTPCT in the last 72 hours. Urine analysis:    Component Value Date/Time   COLORURINE YELLOW 11/13/2021 1505   APPEARANCEUR HAZY (A) 11/13/2021 1505   LABSPEC 1.025 11/13/2021 1505   PHURINE 6.0 11/13/2021 1505   GLUCOSEU NEGATIVE 11/13/2021 1505   HGBUR NEGATIVE 11/13/2021 1505   BILIRUBINUR SMALL (A) 11/13/2021 1505   KETONESUR 15  (A) 11/13/2021 1505   PROTEINUR TRACE (A) 11/13/2021 1505   NITRITE NEGATIVE 11/13/2021 1505   LEUKOCYTESUR NEGATIVE 11/13/2021 1505    Radiological Exams on Admission - Personally Reviewed: DG Ankle Complete Right  Result Date: 12/03/2021 CLINICAL DATA:  Right foot pain. Foot and ankle cellulitis. Swelling. EXAM: RIGHT ANKLE - COMPLETE 3+ VIEW COMPARISON:  None. FINDINGS: The ankle mortise is preserved. No fracture. No erosion, periosteal reaction, or bone destruction. No ankle joint effusion. Prominent talar ridge. Generalized soft tissue edema. No soft tissue air or radiopaque foreign body. IMPRESSION: Generalized soft tissue edema. No acute osseous abnormality or evidence of osteomyelitis. Electronically Signed   By: Ivette Loyal.D.  On: 12/03/2021 01:58   DG Foot Complete Right  Result Date: 12/03/2021 CLINICAL DATA:  Right foot pain. Foot and ankle cellulitis. Swelling with ulcers and sloughing skin. EXAM: RIGHT FOOT COMPLETE - 3+ VIEW COMPARISON:  Most recent radiograph 11/15/2021 FINDINGS: Post prior resection of the great toe. No erosion, periosteal reaction, or bony destruction to suggest osteomyelitis. Spurring of the dorsal talus. Plantar calcaneal spur. No fracture. Generalized soft tissue edema. No soft tissue air or radiopaque foreign body. IMPRESSION: 1. Generalized soft tissue edema. No soft tissue air or radiopaque foreign body. 2. Post prior resection of the great toe. No radiographic evidence of osteomyelitis. Electronically Signed   By: Keith Rake M.D.   On: 12/03/2021 01:57     Assessment/Plan  * Open wounds involving multiple regions of lower extremity- (present on admission) Patient presenting with severe pain of the bilateral lower extremities, right greater than left that has been persisting despite completing a course of antibiotics for suspected cellulitis during his most recent hospitalization Admits to not having dressed his wounds even 1 time since  discharge in mid December Patient has already had follow-up arranged on 1/6 but insist that he cannot wait that long and that the pain is too intolerable hence his presentation ER provider requested admission due to concerns for recurrent cellulitis.   That being said I cannot find compelling evidence for ongoing cellulitis.  Patient does not possess a white count or fever.  There is no active drainage from any of the patient's wounds nor is there any significant induration redness or warmth surrounding the wounds themselves. Obtaining procalcitonin and CRP and if these are elevated will consider initiation of antibiotic therapy. Patient's pain has a primary component which may have been exacerbated somewhat by further breakdown of the wounds. Will provide patient with a limited supply of opiates but I have told him that this will be discontinued after several doses. Reordering dressing changes of the wounds as instructed by wound care nurses during last hospitalization with Xeroform dressings to the open wounds wrapped with Kerlix and secured with tape. Ordering Case management consultation for assistance and arranging for a safe disposition for this patient  Chronic combined systolic and diastolic congestive heart failure ([place])- (present on admission) No clinical evidence of cardiogenic volume overload Continue home regimen of diuretics     Cocaine use disorder, severe, dependence ([place])- (present on admission) Patient admits to regular cocaine use, last being just several days ago Counseling daily on cessation  Coronary artery disease involving native coronary artery of native heart without angina pectoris- (present on admission) Patient is currently chest pain free Monitoring patient on telemetry Continue home regimen of  lipid lowering therapy and AV nodal blocking therapy   Type 2 diabetes mellitus with diabetic polyneuropathy, without long-term current use of insulin ([place])- (present  on admission) Patient is currently chest pain free Monitoring patient on telemetry Continue home regimen of antiplatelet therapy, lipid lowering therapy and AV nodal blocking therapy   Essential hypertension- (present on admission) Resume patients home regimen of oral antihypertensives Titrate antihypertensive regimen as necessary to achieve adequate BP control PRN intravenous antihypertensives for excessively elevated blood pressure    Mixed diabetic hyperlipidemia associated with type 2 diabetes mellitus ([place])- (present on admission) Continuing home regimen of lipid lowering therapy.   Schizoaffective disorder, bipolar type ([place])- (present on admission) Continue home regimen of psychotropic therapy      Code Status:  Full code  code status decision has been confirmed with: patient  Family Communication: deferred   Status is: Observation  The patient remains OBS appropriate and will d/c before 2 midnights.       Vernelle Emerald MD [facility] Pager [phone]  If 7PM-7AM, please contact night-coverage [email or web address] Use universal [place] password for that web site. If you do not have the password, please call the hospital operator.  12/03/2021, 8:52 AM

## 2021-12-03 NOTE — Assessment & Plan Note (Signed)
   Continue home regimen of psychotropic therapy 

## 2021-12-04 ENCOUNTER — Other Ambulatory Visit (HOSPITAL_COMMUNITY): Payer: Self-pay

## 2021-12-04 DIAGNOSIS — J449 Chronic obstructive pulmonary disease, unspecified: Secondary | ICD-10-CM | POA: Diagnosis present

## 2021-12-04 DIAGNOSIS — M25571 Pain in right ankle and joints of right foot: Secondary | ICD-10-CM | POA: Diagnosis present

## 2021-12-04 DIAGNOSIS — L03115 Cellulitis of right lower limb: Secondary | ICD-10-CM

## 2021-12-04 DIAGNOSIS — F1420 Cocaine dependence, uncomplicated: Secondary | ICD-10-CM | POA: Diagnosis present

## 2021-12-04 DIAGNOSIS — Z91199 Patient's noncompliance with other medical treatment and regimen due to unspecified reason: Secondary | ICD-10-CM | POA: Diagnosis not present

## 2021-12-04 DIAGNOSIS — L97829 Non-pressure chronic ulcer of other part of left lower leg with unspecified severity: Secondary | ICD-10-CM | POA: Diagnosis present

## 2021-12-04 DIAGNOSIS — F250 Schizoaffective disorder, bipolar type: Secondary | ICD-10-CM | POA: Diagnosis present

## 2021-12-04 DIAGNOSIS — S81809A Unspecified open wound, unspecified lower leg, initial encounter: Secondary | ICD-10-CM | POA: Diagnosis not present

## 2021-12-04 DIAGNOSIS — L089 Local infection of the skin and subcutaneous tissue, unspecified: Secondary | ICD-10-CM | POA: Diagnosis present

## 2021-12-04 DIAGNOSIS — I10 Essential (primary) hypertension: Secondary | ICD-10-CM | POA: Diagnosis not present

## 2021-12-04 DIAGNOSIS — I872 Venous insufficiency (chronic) (peripheral): Secondary | ICD-10-CM | POA: Diagnosis present

## 2021-12-04 DIAGNOSIS — E11622 Type 2 diabetes mellitus with other skin ulcer: Secondary | ICD-10-CM | POA: Diagnosis present

## 2021-12-04 DIAGNOSIS — K219 Gastro-esophageal reflux disease without esophagitis: Secondary | ICD-10-CM | POA: Diagnosis present

## 2021-12-04 DIAGNOSIS — Z20822 Contact with and (suspected) exposure to covid-19: Secondary | ICD-10-CM | POA: Diagnosis present

## 2021-12-04 DIAGNOSIS — Z79899 Other long term (current) drug therapy: Secondary | ICD-10-CM | POA: Diagnosis not present

## 2021-12-04 DIAGNOSIS — L97819 Non-pressure chronic ulcer of other part of right lower leg with unspecified severity: Secondary | ICD-10-CM | POA: Diagnosis present

## 2021-12-04 DIAGNOSIS — T148XXA Other injury of unspecified body region, initial encounter: Secondary | ICD-10-CM | POA: Diagnosis present

## 2021-12-04 DIAGNOSIS — E782 Mixed hyperlipidemia: Secondary | ICD-10-CM | POA: Diagnosis present

## 2021-12-04 DIAGNOSIS — I2510 Atherosclerotic heart disease of native coronary artery without angina pectoris: Secondary | ICD-10-CM | POA: Diagnosis present

## 2021-12-04 DIAGNOSIS — I5042 Chronic combined systolic (congestive) and diastolic (congestive) heart failure: Secondary | ICD-10-CM | POA: Diagnosis present

## 2021-12-04 DIAGNOSIS — E1169 Type 2 diabetes mellitus with other specified complication: Secondary | ICD-10-CM | POA: Diagnosis present

## 2021-12-04 DIAGNOSIS — E1142 Type 2 diabetes mellitus with diabetic polyneuropathy: Secondary | ICD-10-CM | POA: Diagnosis present

## 2021-12-04 DIAGNOSIS — Z89412 Acquired absence of left great toe: Secondary | ICD-10-CM | POA: Diagnosis not present

## 2021-12-04 DIAGNOSIS — F17210 Nicotine dependence, cigarettes, uncomplicated: Secondary | ICD-10-CM | POA: Diagnosis present

## 2021-12-04 DIAGNOSIS — Z5900 Homelessness unspecified: Secondary | ICD-10-CM | POA: Diagnosis not present

## 2021-12-04 DIAGNOSIS — I110 Hypertensive heart disease with heart failure: Secondary | ICD-10-CM | POA: Diagnosis present

## 2021-12-04 DIAGNOSIS — Z89411 Acquired absence of right great toe: Secondary | ICD-10-CM | POA: Diagnosis not present

## 2021-12-04 DIAGNOSIS — F32A Depression, unspecified: Secondary | ICD-10-CM | POA: Diagnosis present

## 2021-12-04 LAB — CBC WITH DIFFERENTIAL/PLATELET
Abs Immature Granulocytes: 0.02 10*3/uL (ref 0.00–0.07)
Basophils Absolute: 0 10*3/uL (ref 0.0–0.1)
Basophils Relative: 0 %
Eosinophils Absolute: 0.2 10*3/uL (ref 0.0–0.5)
Eosinophils Relative: 4 %
HCT: 40.4 % (ref 39.0–52.0)
Hemoglobin: 13.3 g/dL (ref 13.0–17.0)
Immature Granulocytes: 0 %
Lymphocytes Relative: 40 %
Lymphs Abs: 2.1 10*3/uL (ref 0.7–4.0)
MCH: 31.4 pg (ref 26.0–34.0)
MCHC: 32.9 g/dL (ref 30.0–36.0)
MCV: 95.3 fL (ref 80.0–100.0)
Monocytes Absolute: 0.6 10*3/uL (ref 0.1–1.0)
Monocytes Relative: 11 %
Neutro Abs: 2.3 10*3/uL (ref 1.7–7.7)
Neutrophils Relative %: 45 %
Platelets: 322 10*3/uL (ref 150–400)
RBC: 4.24 MIL/uL (ref 4.22–5.81)
RDW: 13.8 % (ref 11.5–15.5)
WBC: 5.3 10*3/uL (ref 4.0–10.5)
nRBC: 0 % (ref 0.0–0.2)

## 2021-12-04 LAB — COMPREHENSIVE METABOLIC PANEL
ALT: 9 U/L (ref 0–44)
AST: 15 U/L (ref 15–41)
Albumin: 3.1 g/dL — ABNORMAL LOW (ref 3.5–5.0)
Alkaline Phosphatase: 64 U/L (ref 38–126)
Anion gap: 5 (ref 5–15)
BUN: 13 mg/dL (ref 6–20)
CO2: 24 mmol/L (ref 22–32)
Calcium: 8.5 mg/dL — ABNORMAL LOW (ref 8.9–10.3)
Chloride: 107 mmol/L (ref 98–111)
Creatinine, Ser: 1.06 mg/dL (ref 0.61–1.24)
GFR, Estimated: >60 mL/min (ref >60)
Glucose, Bld: 92 mg/dL (ref 70–99)
Potassium: 4.1 mmol/L (ref 3.5–5.1)
Sodium: 136 mmol/L (ref 135–145)
Total Bilirubin: 1 mg/dL (ref 0.3–1.2)
Total Protein: 6.9 g/dL (ref 6.5–8.1)

## 2021-12-04 LAB — GLUCOSE, CAPILLARY: Glucose-Capillary: 153 mg/dL — ABNORMAL HIGH (ref 70–99)

## 2021-12-04 LAB — CBG MONITORING, ED
Glucose-Capillary: 99 mg/dL (ref 70–99)
Glucose-Capillary: 100 mg/dL — ABNORMAL HIGH (ref 70–99)
Glucose-Capillary: 94 mg/dL (ref 70–99)

## 2021-12-04 LAB — MAGNESIUM: Magnesium: 2 mg/dL (ref 1.7–2.4)

## 2021-12-04 MED ORDER — MORPHINE SULFATE (PF) 2 MG/ML IV SOLN
2.0000 mg | Freq: Once | INTRAVENOUS | Status: AC
  Administered 2021-12-04: 2 mg via INTRAVENOUS

## 2021-12-04 MED ORDER — MORPHINE SULFATE (PF) 2 MG/ML IV SOLN
2.0000 mg | INTRAVENOUS | Status: DC | PRN
  Administered 2021-12-04 – 2021-12-09 (×24): 2 mg via INTRAVENOUS
  Filled 2021-12-04 (×27): qty 1

## 2021-12-04 MED ORDER — INSULIN ASPART 100 UNIT/ML IJ SOLN
0.0000 [IU] | Freq: Three times a day (TID) | INTRAMUSCULAR | Status: DC
  Administered 2021-12-05: 2 [IU] via SUBCUTANEOUS
  Administered 2021-12-06: 3 [IU] via SUBCUTANEOUS
  Administered 2021-12-07 – 2021-12-08 (×3): 2 [IU] via SUBCUTANEOUS

## 2021-12-04 NOTE — ED Notes (Signed)
During the night refuses to keep monitors and equipment in place for monitoring. Refused am labs.  Slept most of the night.

## 2021-12-04 NOTE — Consult Note (Signed)
WOC Nurse Consult Note: WOC Nurse is re consulted today for bilateral LE wounds. Was seen yesterday a the request of Dr. Jerolyn Center for same. Please see our notes dated 12/03/21. Topical care guidance is provided for nursing for twice daily care for 3 days and daily care after that.  I have communicated the above to Dr. Sharl Ma, who requested the consultation.  WOC nursing team will not follow, but will remain available to this patient, the nursing and medical teams.  Please re-consult if needed. Thanks, Ladona Mow, MSN, RN, GNP, [place]  Pager# [phone]

## 2021-12-04 NOTE — Progress Notes (Signed)
During shift report rounding, pt had is walker in front of the bed and RN moved the walker from pt's side and asked educated him to the call light and to call for assistance but pt very agitated and annoyed with RN. When RN informed pt to call for assistance and that RN will be outside his door in the hallways for immediate assistance when he calls, pt replied " I am not going to argue with you". Bed alarm on and call light within reach. Will continue to closely monitor. Dionne Bucy RN

## 2021-12-04 NOTE — Progress Notes (Signed)
[facility]  PROGRESS NOTE  [name] EGB:[id number] DOB: January 27, 1966 DOA: 12/02/2021 PCP: Pcp, No   Brief HPI:   56 year old homeless male with multiple comorbidities including diabetes mellitus type 2, congestive heart failure, cocaine abuse, tobacco abuse, bipolar disorder, hypertension, hyperlipidemia, has chronic lower extremity venous insufficiency and wounds.  Patient presented to ED with complaints of bilateral leg pain.  Of note, patient was recently hospitalized at [facility] long hospital from 12/14 until 12/22 for right lower extremity cellulitis.  Patient completed a course of antibiotic treatment.  Patient received wound care throughout the hospitalization.  Patient was discharged on 12/22 and arrangements were made for outpatient follow-up on 1/6  Subjective   Patient continues to have leg pain, he received multiple doses of IV morphine in the ED.   Assessment/Plan:     Bilateral lower extremity ulcers right more than left -Wound care consulted -Continue Xeroform dressing to open wounds wrapped with Kerlix -Continue morphine 2 mg IV every 4 hours as needed for pain  Chronic combined systolic and diastolic CHF -Continue Entresto, Aldactone, Coreg  Cocaine use disorder -Patient uses cocaine regularly -Counseled on admission  CAD -Patient is currently chest pain-free -Continue home regimen of lipid-lowering therapy  -AV nodal blocking agent therapy  Diabetes mellitus type 2 -Continue sliding scale insulin with NovoLog  Hypertension -Continue Coreg, Aldactone, Entresto -Blood pressure stable   Bipolar disorder -Continue Zyprexa at bedtime  History of suspected seizures -Continue Keppra   Medications     atorvastatin  40 mg Oral q1800   carvedilol  3.125 mg Oral BID WC   enoxaparin (LOVENOX) injection  40 mg Subcutaneous Q24H   insulin aspart  0-15 Units Subcutaneous TID AC & HS   levETIRAcetam  500 mg Oral BID   OLANZapine  5 mg Oral Daily    sacubitril-valsartan  1 tablet Oral BID   spironolactone  12.5 mg Oral Daily     Data Reviewed:   CBG:  Recent Labs  Lab 12/03/21 1719 12/03/21 2218 12/04/21 0751 12/04/21 1158 12/04/21 1218  GLUCAP 125* 148* 99 94 100*    SpO2: 100 %    Vitals:   12/04/21 1036 12/04/21 1100 12/04/21 1235 12/04/21 1312  BP: 129/85  (!) 165/81 (!) 167/77  Pulse: 77 77 75 78  Resp: 18  20 20   Temp:   97.6 F (36.4 C) 98.3 F (36.8 C)  TempSrc:   Oral   SpO2: 98% 100% 100% 100%    No intake or output data in the 24 hours ending 12/04/21 1824  No intake/output data recorded.  There were no vitals filed for this visit.  Data Reviewed: Basic Metabolic Panel: Recent Labs  Lab 12/02/21 1706 12/04/21 1203  NA 136 136  K 3.6 4.1  CL 107 107  CO2 21* 24  GLUCOSE 77 92  BUN 12 13  CREATININE 0.91 1.06  CALCIUM 8.6* 8.5*  MG  --  2.0   Liver Function Tests: Recent Labs  Lab 12/04/21 1203  AST 15  ALT 9  ALKPHOS 64  BILITOT 1.0  PROT 6.9  ALBUMIN 3.1*   No results for input(s): LIPASE, AMYLASE in the last 168 hours. No results for input(s): AMMONIA in the last 168 hours. CBC: Recent Labs  Lab 12/02/21 1706 12/04/21 1203  WBC 6.7 5.3  NEUTROABS 3.7 2.3  HGB 12.9* 13.3  HCT 38.0* 40.4  MCV 95.0 95.3  PLT 354 322   Cardiac Enzymes: No results for input(s): CKTOTAL, CKMB, CKMBINDEX, TROPONINI in  the last 168 hours. BNP (last 3 results) Recent Labs    11/02/21 1714 11/07/21 1516 11/23/21 0049  BNP 100.0 40.6 41.3    ProBNP (last 3 results) No results for input(s): PROBNP in the last 8760 hours.  CBG: Recent Labs  Lab 12/03/21 1719 12/03/21 2218 12/04/21 0751 12/04/21 1158 12/04/21 1218  GLUCAP 125* 148* 99 94 100*       Radiology Reports  DG Ankle Complete Right  Result Date: 12/03/2021 CLINICAL DATA:  Right foot pain. Foot and ankle cellulitis. Swelling. EXAM: RIGHT ANKLE - COMPLETE 3+ VIEW COMPARISON:  None. FINDINGS: The ankle mortise is  preserved. No fracture. No erosion, periosteal reaction, or bone destruction. No ankle joint effusion. Prominent talar ridge. Generalized soft tissue edema. No soft tissue air or radiopaque foreign body. IMPRESSION: Generalized soft tissue edema. No acute osseous abnormality or evidence of osteomyelitis. Electronically Signed   By: Keith Rake M.D.   On: 12/03/2021 01:58   DG Foot Complete Right  Result Date: 12/03/2021 CLINICAL DATA:  Right foot pain. Foot and ankle cellulitis. Swelling with ulcers and sloughing skin. EXAM: RIGHT FOOT COMPLETE - 3+ VIEW COMPARISON:  Most recent radiograph 11/15/2021 FINDINGS: Post prior resection of the great toe. No erosion, periosteal reaction, or bony destruction to suggest osteomyelitis. Spurring of the dorsal talus. Plantar calcaneal spur. No fracture. Generalized soft tissue edema. No soft tissue air or radiopaque foreign body. IMPRESSION: 1. Generalized soft tissue edema. No soft tissue air or radiopaque foreign body. 2. Post prior resection of the great toe. No radiographic evidence of osteomyelitis. Electronically Signed   By: Keith Rake M.D.   On: 12/03/2021 01:57       Antibiotics: Anti-infectives (From admission, onward)    None         DVT prophylaxis: Lovenox  Code Status: Full code  Family Communication: No family at bedside   Consultants:   Procedures:     Objective    Physical Examination:  General-appears in no acute distress Heart-S1-S2, regular, no murmur auscultated Lungs-clear to auscultation bilaterally, no wheezing or crackles auscultated Abdomen-soft, nontender, no organomegaly Extremities-tenderness in right lower extremity, right lower extremity in dressing Neuro-alert, oriented x3, no focal deficit noted   Status is: Inpatient  Dispo: The patient is from: Home              Anticipated d/c is to: Home              Anticipated d/c date is: 12/06/2021              Patient currently not stable for  discharge  Barrier to discharge-ongoing treatment for right leg ulcers, pain control  COVID-19 Labs  Recent Labs    12/03/21 0528  CRP 3.3*    Lab Results  Component Value Date   SARSCOV2NAA NEGATIVE 12/03/2021   SARSCOV2NAA NEGATIVE 11/25/2021   [place] NEGATIVE 11/23/2021   [place] NEGATIVE 11/02/2021            Recent Results (from the past 240 hour(s))  Resp Panel by RT-PCR (Flu A&B, Covid) Nasopharyngeal Swab     Status: None   Collection Time: 11/25/21  2:50 AM   Specimen: Nasopharyngeal Swab; Nasopharyngeal(NP) swabs in vial transport medium  Result Value Ref Range Status   SARS Coronavirus 2 by RT PCR NEGATIVE NEGATIVE Final    Comment: (NOTE) SARS-CoV-2 target nucleic acids are NOT DETECTED.  The SARS-CoV-2 RNA is generally detectable in upper respiratory specimens during the acute phase of infection.  The lowest concentration of SARS-CoV-2 viral copies this assay can detect is 138 copies/mL. A negative result does not preclude SARS-Cov-2 infection and should not be used as the sole basis for treatment or other patient management decisions. A negative result may occur with  improper specimen collection/handling, submission of specimen other than nasopharyngeal swab, presence of viral mutation(s) within the areas targeted by this assay, and inadequate number of viral copies(<138 copies/mL). A negative result must be combined with clinical observations, patient history, and epidemiological information. The expected result is Negative.  Fact Sheet for Patients:  EntrepreneurPulse.com.au  Fact Sheet for Healthcare Providers:  IncredibleEmployment.be  This test is no t yet approved or cleared by the [place] FDA and  has been authorized for detection and/or diagnosis of SARS-CoV-2 by FDA under an Emergency Use Authorization (EUA). This EUA will remain  in effect (meaning this test can be used) for the  duration of the COVID-19 declaration under Section 564(b)(1) of the Act, 21 U.S.C.section 360bbb-3(b)(1), unless the authorization is terminated  or revoked sooner.       Influenza A by PCR NEGATIVE NEGATIVE Final   Influenza B by PCR NEGATIVE NEGATIVE Final    Comment: (NOTE) The Xpert Xpress SARS-CoV-2/FLU/RSV plus assay is intended as an aid in the diagnosis of influenza from Nasopharyngeal swab specimens and should not be used as a sole basis for treatment. Nasal washings and aspirates are unacceptable for Xpert Xpress SARS-CoV-2/FLU/RSV testing.  Fact Sheet for Patients: EntrepreneurPulse.com.au  Fact Sheet for Healthcare Providers: IncredibleEmployment.be  This test is not yet approved or cleared by the [place] FDA and has been authorized for detection and/or diagnosis of SARS-CoV-2 by FDA under an Emergency Use Authorization (EUA). This EUA will remain in effect (meaning this test can be used) for the duration of the COVID-19 declaration under Section 564(b)(1) of the Act, 21 U.S.C. section 360bbb-3(b)(1), unless the authorization is terminated or revoked.  Performed at [facility] [place] [address] [place], [place] [address]   Culture, blood (routine x 2)     Status: None (Preliminary result)   Collection Time: 12/03/21  5:24 AM   Specimen: BLOOD  Result Value Ref Range Status   Specimen Description   Final    BLOOD LEFT ANTECUBITAL Performed at [place] [address] [place], [place] [address]    Special Requests   Final    BOTTLES DRAWN AEROBIC ONLY Blood Culture adequate volume Performed at [place] [address] [place], [place] [address]    Culture   Final    NO GROWTH 1 DAY Performed at [facility], [place] [address] [place], [place] [address]    Report Status PENDING  Incomplete  Culture, blood (routine x 2)     Status: None (Preliminary  result)   Collection Time: 12/03/21  5:30 AM   Specimen: BLOOD  Result Value Ref Range Status   Specimen Description   Final    BLOOD RIGHT ANTECUBITAL Performed at [place] [address], [place], [place] [address]    Special Requests   Final    BOTTLES DRAWN AEROBIC AND ANAEROBIC Blood Culture adequate volume Performed at [place] [address], [place], [place] [address]    Culture   Final    NO GROWTH 1 DAY Performed at [facility], [place] [address], [place], [place] [address]    Report Status PENDING  Incomplete  Resp Panel  by RT-PCR (Flu A&B, Covid) Nasopharyngeal Swab     Status: None   Collection Time: 12/03/21  9:19 AM   Specimen: Nasopharyngeal Swab; Nasopharyngeal(NP) swabs in vial transport medium  Result Value Ref Range Status   SARS Coronavirus 2 by RT PCR NEGATIVE NEGATIVE Final    Comment: (NOTE) SARS-CoV-2 target nucleic acids are NOT DETECTED.  The SARS-CoV-2 RNA is generally detectable in upper respiratory specimens during the acute phase of infection. The lowest concentration of SARS-CoV-2 viral copies this assay can detect is 138 copies/mL. A negative result does not preclude SARS-Cov-2 infection and should not be used as the sole basis for treatment or other patient management decisions. A negative result may occur with  improper specimen collection/handling, submission of specimen other than nasopharyngeal swab, presence of viral mutation(s) within the areas targeted by this assay, and inadequate number of viral copies(<138 copies/mL). A negative result must be combined with clinical observations, patient history, and epidemiological information. The expected result is Negative.  Fact Sheet for Patients:  EntrepreneurPulse.com.au  Fact Sheet for Healthcare Providers:  IncredibleEmployment.be  This test is no t yet approved or cleared by the [place] FDA and   has been authorized for detection and/or diagnosis of SARS-CoV-2 by FDA under an Emergency Use Authorization (EUA). This EUA will remain  in effect (meaning this test can be used) for the duration of the COVID-19 declaration under Section 564(b)(1) of the Act, 21 U.S.C.section 360bbb-3(b)(1), unless the authorization is terminated  or revoked sooner.       Influenza A by PCR NEGATIVE NEGATIVE Final   Influenza B by PCR NEGATIVE NEGATIVE Final    Comment: (NOTE) The Xpert Xpress SARS-CoV-2/FLU/RSV plus assay is intended as an aid in the diagnosis of influenza from Nasopharyngeal swab specimens and should not be used as a sole basis for treatment. Nasal washings and aspirates are unacceptable for Xpert Xpress SARS-CoV-2/FLU/RSV testing.  Fact Sheet for Patients: EntrepreneurPulse.com.au  Fact Sheet for Healthcare Providers: IncredibleEmployment.be  This test is not yet approved or cleared by the [place] FDA and has been authorized for detection and/or diagnosis of SARS-CoV-2 by FDA under an Emergency Use Authorization (EUA). This EUA will remain in effect (meaning this test can be used) for the duration of the COVID-19 declaration under Section 564(b)(1) of the Act, 21 U.S.C. section 360bbb-3(b)(1), unless the authorization is terminated or revoked.  Performed at [facility], [place] [address], [place], [place] [address]     [name]   [facility] If 7PM-7AM, please contact night-coverage at [email or web address], Office  [phone]   12/04/2021, 6:24 PM  LOS: 0 days

## 2021-12-04 NOTE — ED Notes (Signed)
Refused lab draw and keep monitors on.

## 2021-12-04 NOTE — Progress Notes (Signed)
Pt is very agitated and hostile towards Clinical research associate. Pt inform RN he does not want her to be his nurse and asks nurse to leave him alone. Pt refuse his night meds including water from RN when given. Charge nurse Tom notified and aware. Dionne Bucy RN

## 2021-12-05 ENCOUNTER — Telehealth (HOSPITAL_COMMUNITY): Payer: Self-pay

## 2021-12-05 DIAGNOSIS — T148XXA Other injury of unspecified body region, initial encounter: Secondary | ICD-10-CM

## 2021-12-05 DIAGNOSIS — S81809A Unspecified open wound, unspecified lower leg, initial encounter: Secondary | ICD-10-CM | POA: Diagnosis not present

## 2021-12-05 DIAGNOSIS — E1169 Type 2 diabetes mellitus with other specified complication: Secondary | ICD-10-CM | POA: Diagnosis not present

## 2021-12-05 DIAGNOSIS — E1142 Type 2 diabetes mellitus with diabetic polyneuropathy: Secondary | ICD-10-CM | POA: Diagnosis not present

## 2021-12-05 DIAGNOSIS — L089 Local infection of the skin and subcutaneous tissue, unspecified: Secondary | ICD-10-CM

## 2021-12-05 LAB — GLUCOSE, CAPILLARY
Glucose-Capillary: 102 mg/dL — ABNORMAL HIGH (ref 70–99)
Glucose-Capillary: 128 mg/dL — ABNORMAL HIGH (ref 70–99)
Glucose-Capillary: 184 mg/dL — ABNORMAL HIGH (ref 70–99)
Glucose-Capillary: 144 mg/dL — ABNORMAL HIGH (ref 70–99)

## 2021-12-05 MED ORDER — CARVEDILOL 6.25 MG PO TABS
6.2500 mg | ORAL_TABLET | Freq: Two times a day (BID) | ORAL | Status: DC
  Administered 2021-12-05 – 2021-12-09 (×8): 6.25 mg via ORAL
  Filled 2021-12-05 (×8): qty 1

## 2021-12-05 MED ORDER — MORPHINE SULFATE (PF) 2 MG/ML IV SOLN
1.0000 mg | Freq: Once | INTRAVENOUS | Status: AC
  Administered 2021-12-05: 1 mg via INTRAVENOUS
  Filled 2021-12-05: qty 1

## 2021-12-05 NOTE — TOC Initial Note (Signed)
Transition of Care [facility]) - Initial/Assessment Note    Patient Details  Name: [name] MRN: [id number] Date of Birth: 02/03/1966  [facility] CM/SW Contact:    Ida Rogue, [name] Phone Number: 12/05/2021, 11:41 AM  Clinical Narrative:   Patient seen for dispositional planning.  [name] asked for help getting into a SNF.  He confirmed for me that he still has out of state MCD, and I explained I can't help him get into placement until he gets that changed back to [place] [facility].  His response focused on the fact that he has no permanent address, and he asked for the address to the [facility].  Given. When asked if he needed a ride at d/c, he declined, and indicated that he was thinking about heading to [facility] for help. TOC will continue to follow during the course of hospitalization.                Expected Discharge Plan: Home/Self Care Barriers to Discharge: No Barriers Identified   Patient Goals and CMS Choice        Expected Discharge Plan and Services Expected Discharge Plan: Home/Self Care                                              Prior Living Arrangements/Services                       Activities of Daily Living Home Assistive Devices/Equipment: Wheelchair ADL Screening (condition at time of admission) Patient's cognitive ability adequate to safely complete daily activities?: No Is the patient deaf or have difficulty hearing?: No Does the patient have difficulty seeing, even when wearing glasses/contacts?: No Does the patient have difficulty concentrating, remembering, or making decisions?: No Patient able to express need for assistance with ADLs?: No Does the patient have difficulty dressing or bathing?: Yes Independently performs ADLs?: No Communication: Independent Is this a change from baseline?: Pre-admission baseline Dressing (OT): Needs assistance Is this a change from baseline?: Pre-admission baseline Grooming: Needs  assistance Is this a change from baseline?: Pre-admission baseline Feeding: Needs assistance Is this a change from baseline?: Pre-admission baseline Bathing: Needs assistance Is this a change from baseline?: Pre-admission baseline Toileting: Needs assistance Is this a change from baseline?: Pre-admission baseline In/Out Bed: Needs assistance Is this a change from baseline?: Pre-admission baseline Walks in Home: Dependent Is this a change from baseline?: Pre-admission baseline Does the patient have difficulty walking or climbing stairs?: Yes Weakness of Legs: Both Weakness of Arms/Hands: Right  Permission Sought/Granted                  Emotional Assessment              Admission diagnosis:  Cellulitis of right lower extremity [L03.115] Cellulitis of both lower extremities [L03.115, L03.116] Wound infection [T14.8XXA, L08.9] Patient Active Problem List   Diagnosis Date Noted   Wound infection 12/04/2021   Open wounds involving multiple regions of lower extremity 12/03/2021   Mixed diabetic hyperlipidemia associated with type 2 diabetes mellitus (HCC) 12/03/2021   Cellulitis of right lower extremity 11/13/2021   Type 2 diabetes mellitus with diabetic polyneuropathy, without long-term current use of insulin (HCC) 11/13/2021   Antisocial personality disorder (HCC) 11/04/2021   Malingering 11/04/2021   Foot pain 04/23/2021   Chronic combined systolic and diastolic congestive  heart failure (HCC) 03/02/2021   History of stroke 03/02/2021   Substance use disorder 03/02/2021   Skin ulcer of right great toe with necrosis of bone (HCC)    Encounter for assessment of healthcare decision-making capacity    Pain, dental    Dystonia    Dysarthria    Shock liver 01/04/2021   Acute metabolic encephalopathy 01/04/2021   Coronary artery disease involving native coronary artery of native heart without angina pectoris 01/04/2021   Essential hypertension 01/04/2021   Bipolar I  disorder (HCC) 01/04/2021   Cocaine abuse (HCC) 01/04/2021   Homelessness 01/04/2021   HLD (hyperlipidemia) 01/04/2021   Osteomyelitis of great toe (HCC)    Ulcer of great toe, left, with necrosis of bone (HCC)    Ulcer of great toe, right, with necrosis of muscle (HCC)    Acute osteomyelitis of toe, left (HCC)    Acute on chronic combined systolic and diastolic CHF (congestive heart failure) (HCC) 12/20/2020   Respiratory failure (HCC)    Ischemic stroke (HCC) 12/17/2020   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   Diabetic foot ulcer (HCC) 12/08/2020   CHF (congestive heart failure) (HCC) 11/14/2020   CHF exacerbation (HCC) 11/13/2020   Hypokalemia    Hyperlipidemia    Acute respiratory failure with hypoxia (HCC) 10/26/2020   COPD with acute exacerbation (HCC) 10/14/2020   Acute on chronic combined systolic and diastolic CHF (congestive heart failure) (HCC) 10/02/2020   Near syncope 07/21/2020   Schizoaffective disorder (HCC) 06/24/2019   SOB (shortness of breath)    QT prolongation 05/25/2019   Cocaine abuse (HCC)    Elevated troponin    Homeless    Suicidal ideation    Bilateral foot pain 04/05/2019   Housing situation unstable 03/28/2019   Bilateral lower extremity edema    Asthma 12/26/2016   Tobacco use disorder 12/26/2016   Schizophrenia (HCC) 12/26/2016   Cocaine abuse with cocaine-induced mood disorder (HCC) 09/06/2016   Alcohol use disorder, mild, abuse 07/31/2016   Substance induced mood disorder (HCC) 07/30/2016   Schizoaffective disorder, bipolar type (HCC) 05/31/2016   Cocaine use disorder, severe, dependence (HCC) 05/31/2016   PCP:  Pcp, No Pharmacy:   [facility] - [place], [name] - [address] [name] [address] Phone: [phone] Fax: [phone]  [facility] [address] [name] [address] Phone: [phone] Fax: [phone]  [id number] - [place], [name] - [address] AT [facility] OF [address] [name] [address] Phone: [phone] Fax: [phone]     Social Determinants of Health (SDOH) Interventions    Readmission Risk Interventions Readmission Risk Prevention Plan 03/05/2021 01/07/2021 12/10/2020  Transportation Screening Complete Complete Complete  PCP or Specialist Appt within 3-5 Days - Complete -  HRI or Home Care Consult - Complete -  Social Work Consult for Recovery Care Planning/Counseling - Complete -  Palliative Care Screening - Complete -  Medication Review Oceanographer) Complete Complete Referral to Pharmacy  PCP or Specialist appointment within 3-5 days of discharge Complete - Complete  HRI or Home Care Consult Complete - Complete  SW Recovery Care/Counseling Consult Complete - Complete  Palliative Care Screening Not Applicable - Not Applicable  Skilled Nursing Facility Complete - Not Applicable  Some recent data might be hidden

## 2021-12-05 NOTE — Progress Notes (Signed)
This Clinical research associate assumed patient care after patient verbalized hostility toward previous Rn.

## 2021-12-05 NOTE — BH Assessment (Signed)
Care Management - [facility] Follow Up Discharges   Writer attempted to make contact with patient today and was unsuccessful.  Writer left a HIPPA compliant voice message.   Per chart review, patient was provided with outpatient resources and 11-25-2021.

## 2021-12-05 NOTE — Progress Notes (Signed)
[facility]  PROGRESS NOTE  [name] [id number] DOB: [birth year] DOA: 12/02/2021 PCP: Pcp, No   Brief HPI:   56 year old homeless male with multiple comorbidities including diabetes mellitus type 2, congestive heart failure, cocaine abuse, tobacco abuse, bipolar disorder, hypertension, hyperlipidemia, has chronic lower extremity venous insufficiency and wounds.  Patient presented to ED with complaints of bilateral leg pain.  Of note, patient was recently hospitalized at [facility] long hospital from 12/14 until 12/22 for right lower extremity cellulitis.  Patient completed a course of antibiotic treatment.  Patient received wound care throughout the hospitalization.  Patient was discharged on 12/22 and arrangements were made for outpatient follow-up on 1/6  Subjective   Patient still complains of leg pain.   Assessment/Plan:     Bilateral lower extremity ulcers right more than left -Wound care consulted -Continue Xeroform dressing to open wounds wrapped with Kerlix -Continue morphine 2 mg IV every 4 hours as needed for pain  Chronic combined systolic and diastolic CHF -Continue Entresto, Aldactone, Coreg  Cocaine use disorder -Patient uses cocaine regularly -Counseled on admission  CAD -Patient is currently chest pain-free -Continue home regimen of lipid-lowering therapy  -AV nodal blocking agent therapy  Diabetes mellitus type 2 -Continue sliding scale insulin with NovoLog -CBG well controlled  Hypertension -Continue Coreg, Aldactone, Entresto -Blood pressure is elevated -We will change Coreg to 6.25 mg p.o. twice daily   Bipolar disorder -Continue Zyprexa at bedtime  History of suspected seizures -Patient refusing to take Keppra, states he does not have history of seizures    Medications     atorvastatin  40 mg Oral q1800   carvedilol  3.125 mg Oral BID WC   enoxaparin (LOVENOX) injection  40 mg Subcutaneous Q24H   insulin aspart  0-9 Units  Subcutaneous TID WC   levETIRAcetam  500 mg Oral BID   OLANZapine  5 mg Oral Daily   sacubitril-valsartan  1 tablet Oral BID   spironolactone  12.5 mg Oral Daily     Data Reviewed:   CBG:  Recent Labs  Lab 12/04/21 0751 12/04/21 1158 12/04/21 1218 12/04/21 2036 12/05/21 0821  GLUCAP 99 94 100* 153* 102*    SpO2: 99 %    Vitals:   12/04/21 1312 12/04/21 2033 12/05/21 0516 12/05/21 1219  BP: (!) 167/77 (!) 151/83 (!) 179/94 (!) 164/104  Pulse: 78 89 72 75  Resp: 20 (!) 22 19 18   Temp: 98.3 F (36.8 C) 98.6 F (37 C) (!) 97.5 F (36.4 C) 98.7 F (37.1 C)  TempSrc:  Oral Oral   SpO2: 100% 99% 98% 99%     Intake/Output Summary (Last 24 hours) at 12/05/2021 1553 Last data filed at 12/05/2021 1100 Gross per 24 hour  Intake --  Output 1600 ml  Net -1600 ml    No intake/output data recorded.  There were no vitals filed for this visit.  Data Reviewed: Basic Metabolic Panel: Recent Labs  Lab 12/02/21 1706 12/04/21 1203  NA 136 136  K 3.6 4.1  CL 107 107  CO2 21* 24  GLUCOSE 77 92  BUN 12 13  CREATININE 0.91 1.06  CALCIUM 8.6* 8.5*  MG  --  2.0   Liver Function Tests: Recent Labs  Lab 12/04/21 1203  AST 15  ALT 9  ALKPHOS 64  BILITOT 1.0  PROT 6.9  ALBUMIN 3.1*   No results for input(s): LIPASE, AMYLASE in the last 168 hours. No results for input(s): AMMONIA in the last 168 hours. CBC: Recent  Labs  Lab 12/02/21 1706 12/04/21 1203  WBC 6.7 5.3  NEUTROABS 3.7 2.3  HGB 12.9* 13.3  HCT 38.0* 40.4  MCV 95.0 95.3  PLT 354 322   Cardiac Enzymes: No results for input(s): CKTOTAL, CKMB, CKMBINDEX, TROPONINI in the last 168 hours. BNP (last 3 results) Recent Labs    11/02/21 1714 11/07/21 1516 11/23/21 0049  BNP 100.0 40.6 41.3    ProBNP (last 3 results) No results for input(s): PROBNP in the last 8760 hours.  CBG: Recent Labs  Lab 12/04/21 0751 12/04/21 1158 12/04/21 1218 12/04/21 2036 12/05/21 0821  GLUCAP 99 94 100* 153* 102*        Radiology Reports  No results found.     Antibiotics: Anti-infectives (From admission, onward)    None         DVT prophylaxis: Lovenox  Code Status: Full code  Family Communication: No family at bedside   Consultants:   Procedures:     Objective    Physical Examination:  General-appears in no acute distress Heart-S1-S2, regular, no murmur auscultated Lungs-clear to auscultation bilaterally, no wheezing or crackles auscultated Abdomen-soft, nontender, no organomegaly Extremities-right lower extremity in dressing Neuro-alert, oriented x3, no focal deficit noted   Status is: Inpatient  Dispo: The patient is from: Home              Anticipated d/c is to: Home              Anticipated d/c date is: 12/06/2021              Patient currently not stable for discharge  Barrier to discharge-ongoing treatment for right leg ulcers, pain control  COVID-19 Labs  Recent Labs    12/03/21 0528  CRP 3.3*    Lab Results  Component Value Date   SARSCOV2NAA NEGATIVE 12/03/2021   [place] NEGATIVE 11/25/2021   [place] NEGATIVE 11/23/2021   [place] NEGATIVE 11/02/2021            Recent Results (from the past 240 hour(s))  Culture, blood (routine x 2)     Status: None (Preliminary result)   Collection Time: 12/03/21  5:24 AM   Specimen: BLOOD  Result Value Ref Range Status   Specimen Description   Final    BLOOD LEFT ANTECUBITAL Performed at [facility], [place] [address], [place], [place] [address]    Special Requests   Final    BOTTLES DRAWN AEROBIC ONLY Blood Culture adequate volume Performed at [facility] [address], [place], [place] [address]    Culture   Final    NO GROWTH 2 DAYS Performed at [facility] [address], [place], [place] [address]    Report Status PENDING  Incomplete  Culture, blood (routine x 2)     Status: None (Preliminary result)   Collection Time:  12/03/21  5:30 AM   Specimen: BLOOD  Result Value Ref Range Status   Specimen Description   Final    BLOOD RIGHT ANTECUBITAL Performed at [facility] [address], [place], [place] [address]    Special Requests   Final    BOTTLES DRAWN AEROBIC AND ANAEROBIC Blood Culture adequate volume Performed at [facility] [address], [place],  [address]    Culture   Final    NO GROWTH 2 DAYS Performed at [facility] [address], [place],  [address]    Report Status PENDING  Incomplete  Resp Panel by RT-PCR (Flu A&B, Covid) Nasopharyngeal Swab     Status: None   Collection Time: 12/03/21  9:19 AM   Specimen: Nasopharyngeal Swab; Nasopharyngeal(NP) swabs in vial transport medium  Result Value Ref Range Status   SARS Coronavirus 2 by RT PCR NEGATIVE NEGATIVE Final    Comment: (NOTE) SARS-CoV-2 target nucleic acids are NOT DETECTED.  The SARS-CoV-2 RNA is generally detectable in upper respiratory specimens during the acute phase of infection. The lowest concentration of SARS-CoV-2 viral copies this assay can detect is 138 copies/mL. A negative result does not preclude SARS-Cov-2 infection and should not be used as the sole basis for treatment or other patient management decisions. A negative result may occur with  improper specimen collection/handling, submission of specimen other than nasopharyngeal swab, presence of viral mutation(s) within the areas targeted by this assay, and inadequate number of viral copies(<138 copies/mL). A negative result must be combined with clinical observations, patient history, and epidemiological information. The expected result is Negative.  Fact Sheet for Patients:  EntrepreneurPulse.com.au  Fact Sheet for Healthcare Providers:  IncredibleEmployment.be  This test is no t yet approved or cleared by the [place] FDA and  has been authorized for  detection and/or diagnosis of SARS-CoV-2 by FDA under an Emergency Use Authorization (EUA). This EUA will remain  in effect (meaning this test can be used) for the duration of the COVID-19 declaration under Section 564(b)(1) of the Act, 21 U.S.C.section 360bbb-3(b)(1), unless the authorization is terminated  or revoked sooner.       Influenza A by PCR NEGATIVE NEGATIVE Final   Influenza B by PCR NEGATIVE NEGATIVE Final    Comment: (NOTE) The Xpert Xpress SARS-CoV-2/FLU/RSV plus assay is intended as an aid in the diagnosis of influenza from Nasopharyngeal swab specimens and should not be used as a sole basis for treatment. Nasal washings and aspirates are unacceptable for Xpert Xpress SARS-CoV-2/FLU/RSV testing.  Fact Sheet for Patients: EntrepreneurPulse.com.au  Fact Sheet for Healthcare Providers: IncredibleEmployment.be  This test is not yet approved or cleared by the [place] FDA and has been authorized for detection and/or diagnosis of SARS-CoV-2 by FDA under an Emergency Use Authorization (EUA). This EUA will remain in effect (meaning this test can be used) for the duration of the COVID-19 declaration under Section 564(b)(1) of the Act, 21 U.S.C. section 360bbb-3(b)(1), unless the authorization is terminated or revoked.  Performed at [facility], [place] [address] [place], [place] [address]     [name]   [facility] If 7PM-7AM, please contact night-coverage at [email or web address], Office  [phone]   12/05/2021, 3:53 PM  LOS: 1 day

## 2021-12-06 DIAGNOSIS — E1142 Type 2 diabetes mellitus with diabetic polyneuropathy: Secondary | ICD-10-CM | POA: Diagnosis not present

## 2021-12-06 DIAGNOSIS — S81809A Unspecified open wound, unspecified lower leg, initial encounter: Secondary | ICD-10-CM | POA: Diagnosis not present

## 2021-12-06 DIAGNOSIS — I10 Essential (primary) hypertension: Secondary | ICD-10-CM | POA: Diagnosis not present

## 2021-12-06 LAB — GLUCOSE, CAPILLARY
Glucose-Capillary: 105 mg/dL — ABNORMAL HIGH (ref 70–99)
Glucose-Capillary: 155 mg/dL — ABNORMAL HIGH (ref 70–99)
Glucose-Capillary: 120 mg/dL — ABNORMAL HIGH (ref 70–99)

## 2021-12-06 MED ORDER — OXYCODONE HCL 5 MG PO TABS
5.0000 mg | ORAL_TABLET | Freq: Four times a day (QID) | ORAL | Status: DC | PRN
  Administered 2021-12-06 – 2021-12-07 (×2): 5 mg via ORAL
  Filled 2021-12-06 (×4): qty 1

## 2021-12-06 NOTE — TOC Progression Note (Signed)
Transition of Care [facility]) - Progression Note    Patient Details  Name: Mukund Weinreb MRN: [id number] Date of Birth: July 12, 1966  [facility] CM/SW Contact  Ida Rogue, [name] Phone Number: 12/06/2021, 2:34 PM  Clinical Narrative:   Followed up with patient re: dispositional plan. Mr Cavanah stated he had done what I had asked.  He called [facility] and asked that he be switched to [facility].  He gave them the [facility] as his mailing address, and was told that it would take about a week.  He then inquired about going somewhere l for help.  "I prefer [facility]."  I explained that I could not initiate bed search until we had confirmation of [facility] #, and he may be medically stable for d/c prior to that.  I inquired as to where he would go if d/ced over the weekend.  Rather than replying, he reached for the phone and dismissed me by saying he was calling to check on his [facility] number right now, and would let me know when he gets confirmation of same. TOC will continue to follow during the course of hospitalization.     Expected Discharge Plan: Home/Self Care Barriers to Discharge: No Barriers Identified  Expected Discharge Plan and Services Expected Discharge Plan: Home/Self Care                                               Social Determinants of Health (SDOH) Interventions    Readmission Risk Interventions Readmission Risk Prevention Plan 12/05/2021 03/05/2021 01/07/2021  Transportation Screening Complete Complete Complete  PCP or Specialist Appt within 3-5 Days - - Complete  HRI or Home Care Consult - - Complete  Social Work Consult for Recovery Care Planning/Counseling - - Complete  Palliative Care Screening - - Complete  Medication Review Oceanographer) Not Complete Complete Complete  PCP or Specialist appointment within 3-5 days of discharge Not Complete Complete -  HRI or Home Care Consult Not Complete Complete -  SW Recovery Care/Counseling Consult Not Complete  Complete -  Palliative Care Screening Not Applicable Not Applicable -  Skilled Nursing Facility Not Applicable Complete -  Some recent data might be hidden

## 2021-12-06 NOTE — Progress Notes (Signed)
Patient's wound care done as ordered. Wound bed moist, yellow and pink tissue noted, mild serosanguinous drainage.

## 2021-12-06 NOTE — Progress Notes (Addendum)
[facility]  PROGRESS NOTE  [name] OFH:[id number] DOB: [birth year] DOA: 12/02/2021 PCP: Pcp, No   Brief HPI:   56 year old homeless male with multiple comorbidities including diabetes mellitus type 2, congestive heart failure, cocaine abuse, tobacco abuse, bipolar disorder, hypertension, hyperlipidemia, has chronic lower extremity venous insufficiency and wounds.  Patient presented to ED with complaints of bilateral leg pain.  Of note, patient was recently hospitalized at [facility] from 12/14 until 12/22 for right lower extremity cellulitis.  Patient completed a course of antibiotic treatment.  Patient received wound care throughout the hospitalization.  Patient was discharged on 12/22 and arrangements were made for outpatient follow-up on 1/6  Subjective   Patient seen, continues to have leg pain.  Dressing changes were not done to right lower extremity as ordered by wound care.   Assessment/Plan:     Bilateral lower extremity ulcers right more than left -Wound care consulted -Recommended dressing changes daily for 3 days however it was not done -Have asked nursing staff to start dressing changes from today daily for 3 days . -Continue morphine 2 mg IV every 4 hours as needed for pain  Chronic combined systolic and diastolic CHF -Continue Entresto, Aldactone, Coreg  Cocaine use disorder -Patient uses cocaine regularly -Counseled on admission  CAD -Patient is currently chest pain-free -Continue home regimen of lipid-lowering therapy  -AV nodal blocking agent therapy  Diabetes mellitus type 2 -Continue sliding scale insulin with NovoLog -CBG well controlled  Hypertension -Continue Coreg, Aldactone, Entresto -Blood pressure is elevated -Dose of Coreg was changed to 6.25 mg p.o. twice daily    Bipolar disorder -Continue Zyprexa at bedtime  History of suspected seizures -Patient refusing to take Keppra, states he does not have history of  seizures    Medications     atorvastatin  40 mg Oral q1800   carvedilol  6.25 mg Oral BID WC   enoxaparin (LOVENOX) injection  40 mg Subcutaneous Q24H   insulin aspart  0-9 Units Subcutaneous TID WC   levETIRAcetam  500 mg Oral BID   OLANZapine  5 mg Oral Daily   sacubitril-valsartan  1 tablet Oral BID   spironolactone  12.5 mg Oral Daily     Data Reviewed:   CBG:  Recent Labs  Lab 12/05/21 1217 12/05/21 1712 12/05/21 2059 12/06/21 0813 12/06/21 1228  GLUCAP 128* 184* 144* 105* 155*    SpO2: 100 %    Vitals:   12/05/21 2314 12/06/21 0300 12/06/21 0454 12/06/21 0500  BP:   (!) 167/90   Pulse:   67   Resp: 17 17  13   Temp:   97.7 F (36.5 C)   TempSrc:   Oral   SpO2:   100%      Intake/Output Summary (Last 24 hours) at 12/06/2021 1413 Last data filed at 12/06/2021 0900 Gross per 24 hour  Intake 360 ml  Output --  Net 360 ml    01/04 1901 - 01/06 0700 In: 240 [P.O.:240] Out: 1600 [Urine:1600]  There were no vitals filed for this visit.  Data Reviewed: Basic Metabolic Panel: Recent Labs  Lab 12/02/21 1706 12/04/21 1203  NA 136 136  K 3.6 4.1  CL 107 107  CO2 21* 24  GLUCOSE 77 92  BUN 12 13  CREATININE 0.91 1.06  CALCIUM 8.6* 8.5*  MG  --  2.0   Liver Function Tests: Recent Labs  Lab 12/04/21 1203  AST 15  ALT 9  ALKPHOS 64  BILITOT 1.0  PROT  6.9  ALBUMIN 3.1*   No results for input(s): LIPASE, AMYLASE in the last 168 hours. No results for input(s): AMMONIA in the last 168 hours. CBC: Recent Labs  Lab 12/02/21 1706 12/04/21 1203  WBC 6.7 5.3  NEUTROABS 3.7 2.3  HGB 12.9* 13.3  HCT 38.0* 40.4  MCV 95.0 95.3  PLT 354 322   Cardiac Enzymes: No results for input(s): CKTOTAL, CKMB, CKMBINDEX, TROPONINI in the last 168 hours. BNP (last 3 results) Recent Labs    11/02/21 1714 11/07/21 1516 11/23/21 0049  BNP 100.0 40.6 41.3    ProBNP (last 3 results) No results for input(s): PROBNP in the last 8760  hours.  CBG: Recent Labs  Lab 12/05/21 1217 12/05/21 1712 12/05/21 2059 12/06/21 0813 12/06/21 1228  GLUCAP 128* 184* 144* 105* 155*       Radiology Reports  No results found.     Antibiotics: Anti-infectives (From admission, onward)    None         DVT prophylaxis: Lovenox  Code Status: Full code  Family Communication: No family at bedside   Consultants:   Procedures:     Objective    Physical Examination:  General-appears in no acute distress Heart-S1-S2, regular, no murmur auscultated Lungs-clear to auscultation bilaterally, no wheezing or crackles auscultated Abdomen-soft, nontender, no organomegaly Extremities-right lower extremity in dressing  Neuro-alert, oriented x3, no focal deficit noted  Status is: Inpatient  Dispo: The patient is from: Home              Anticipated d/c is to: Home              Anticipated d/c date is: 12/06/2021              Patient currently not stable for discharge  Barrier to discharge-ongoing treatment for right leg ulcers, pain control  COVID-19 Labs  No results for input(s): DDIMER, FERRITIN, LDH, CRP in the last 72 hours.   Lab Results  Component Value Date   SARSCOV2NAA NEGATIVE 12/03/2021   [place] NEGATIVE 11/25/2021   [place] NEGATIVE 11/23/2021   [place] NEGATIVE 11/02/2021            Recent Results (from the past 240 hour(s))  Culture, blood (routine x 2)     Status: None (Preliminary result)   Collection Time: 12/03/21  5:24 AM   Specimen: BLOOD  Result Value Ref Range Status   Specimen Description   Final    BLOOD LEFT ANTECUBITAL Performed at [facility] [address], [place], [place] [address]    Special Requests   Final    BOTTLES DRAWN AEROBIC ONLY Blood Culture adequate volume Performed at [facility] [address], [place], [place] [address]    Culture   Final    NO GROWTH 3 DAYS Performed at [facility], [place] [address] [place], [place] [address]    Report Status PENDING  Incomplete  Culture, blood (routine x 2)     Status: None (Preliminary result)   Collection Time: 12/03/21  5:30 AM   Specimen: BLOOD  Result Value Ref Range Status   Specimen Description   Final    BLOOD RIGHT ANTECUBITAL Performed at [facility] [address], [place], [place] [address]    Special Requests   Final    BOTTLES DRAWN AEROBIC AND ANAEROBIC Blood Culture adequate volume Performed at [facility] [address] [place], [place] [address]    Culture  Final    NO GROWTH 3 DAYS Performed at [facility], [place] [address] [place], [place] [address]    Report Status PENDING  Incomplete  Resp Panel by RT-PCR (Flu A&B, Covid) Nasopharyngeal Swab     Status: None   Collection Time: 12/03/21  9:19 AM   Specimen: Nasopharyngeal Swab; Nasopharyngeal(NP) swabs in vial transport medium  Result Value Ref Range Status   SARS Coronavirus 2 by RT PCR NEGATIVE NEGATIVE Final    Comment: (NOTE) SARS-CoV-2 target nucleic acids are NOT DETECTED.  The SARS-CoV-2 RNA is generally detectable in upper respiratory specimens during the acute phase of infection. The lowest concentration of SARS-CoV-2 viral copies this assay can detect is 138 copies/mL. A negative result does not preclude SARS-Cov-2 infection and should not be used as the sole basis for treatment or other patient management decisions. A negative result may occur with  improper specimen collection/handling, submission of specimen other than nasopharyngeal swab, presence of viral mutation(s) within the areas targeted by this assay, and inadequate number of viral copies(<138 copies/mL). A negative result must be combined with clinical observations, patient history, and epidemiological information. The expected result is Negative.  Fact Sheet for Patients:  EntrepreneurPulse.com.au  Fact Sheet  for Healthcare Providers:  IncredibleEmployment.be  This test is no t yet approved or cleared by the [place] FDA and  has been authorized for detection and/or diagnosis of SARS-CoV-2 by FDA under an Emergency Use Authorization (EUA). This EUA will remain  in effect (meaning this test can be used) for the duration of the COVID-19 declaration under Section 564(b)(1) of the Act, 21 U.S.C.section 360bbb-3(b)(1), unless the authorization is terminated  or revoked sooner.       Influenza A by PCR NEGATIVE NEGATIVE Final   Influenza B by PCR NEGATIVE NEGATIVE Final    Comment: (NOTE) The Xpert Xpress SARS-CoV-2/FLU/RSV plus assay is intended as an aid in the diagnosis of influenza from Nasopharyngeal swab specimens and should not be used as a sole basis for treatment. Nasal washings and aspirates are unacceptable for Xpert Xpress SARS-CoV-2/FLU/RSV testing.  Fact Sheet for Patients: EntrepreneurPulse.com.au  Fact Sheet for Healthcare Providers: IncredibleEmployment.be  This test is not yet approved or cleared by the [place] FDA and has been authorized for detection and/or diagnosis of SARS-CoV-2 by FDA under an Emergency Use Authorization (EUA). This EUA will remain in effect (meaning this test can be used) for the duration of the COVID-19 declaration under Section 564(b)(1) of the Act, 21 U.S.C. section 360bbb-3(b)(1), unless the authorization is terminated or revoked.  Performed at [facility], [place] [address] [place], [place] [address]     [name]   [facility] If 7PM-7AM, please contact night-coverage at [email or web address], Office  [phone]   12/06/2021, 2:13 PM  LOS: 2 days

## 2021-12-06 NOTE — Progress Notes (Addendum)
The patient refused bed alarm and wanted his w/c and walker close to the bedside so he could get up anytime he wants. He's very agitated right now. He's A & O x 4.  Patient educated by this RN on his high fall risk and the need to call staff before getting out of bed. Patient stated, "If I fall down it's on me so don't worry about it." [facility] notified of patient's behavior and she came up to talk to the patient, and he still  wants to be independent. Will continue to monitor.

## 2021-12-06 NOTE — Progress Notes (Signed)
Patient ID: [name], male   DOB: Feb 24, 1966, 56 y.o.   MRN: [id number] As needed oxycodone has been ordered for lower extremity pain on as-needed basis total of 4 doses. Phillips Climes MD

## 2021-12-07 DIAGNOSIS — E1142 Type 2 diabetes mellitus with diabetic polyneuropathy: Secondary | ICD-10-CM | POA: Diagnosis not present

## 2021-12-07 DIAGNOSIS — L03115 Cellulitis of right lower limb: Secondary | ICD-10-CM | POA: Diagnosis not present

## 2021-12-07 DIAGNOSIS — I5042 Chronic combined systolic (congestive) and diastolic (congestive) heart failure: Secondary | ICD-10-CM | POA: Diagnosis not present

## 2021-12-07 DIAGNOSIS — S81809A Unspecified open wound, unspecified lower leg, initial encounter: Secondary | ICD-10-CM | POA: Diagnosis not present

## 2021-12-07 LAB — GLUCOSE, CAPILLARY
Glucose-Capillary: 106 mg/dL — ABNORMAL HIGH (ref 70–99)
Glucose-Capillary: 157 mg/dL — ABNORMAL HIGH (ref 70–99)
Glucose-Capillary: 183 mg/dL — ABNORMAL HIGH (ref 70–99)
Glucose-Capillary: 147 mg/dL — ABNORMAL HIGH (ref 70–99)

## 2021-12-07 NOTE — Progress Notes (Signed)
[facility]  PROGRESS NOTE  [name] [id number] DOB: [birth year] DOA: 12/02/2021 PCP: Pcp, No   Brief HPI:   56 year old homeless male with multiple comorbidities including diabetes mellitus type 2, congestive heart failure, cocaine abuse, tobacco abuse, bipolar disorder, hypertension, hyperlipidemia, has chronic lower extremity venous insufficiency and wounds.  Patient presented to ED with complaints of bilateral leg pain.  Of note, patient was recently hospitalized at [facility] long hospital from 12/14 until 12/22 for right lower extremity cellulitis.  Patient completed a course of antibiotic treatment.  Patient received wound care throughout the hospitalization.  Patient was discharged on 12/22 and arrangements were made for outpatient follow-up on 1/6  Subjective   Patient seen is upset that we are unable to find a place for him to go.  Wants to talk to Education officer, [employer].  Continues to have leg pain.  Dressing changes initiated yesterday.   Assessment/Plan:     Bilateral lower extremity ulcers right more than left -Wound care consulted -Recommended dressing changes daily for 3 days however it was not done -Have asked nursing staff to start dressing changes from today daily for 3 days . -Today's day #2, lower extremity ulcer looks better. -Continue morphine 2 mg IV every 4 hours as needed for pain  Chronic combined systolic and diastolic CHF -Continue Entresto, Aldactone, Coreg  Cocaine use disorder -Patient uses cocaine regularly -Counseled on admission  CAD -Patient is currently chest pain-free -Continue home regimen of lipid-lowering therapy  -AV nodal blocking agent therapy  Diabetes mellitus type 2 -Continue sliding scale insulin with NovoLog -CBG well controlled  Hypertension -Continue Coreg, Aldactone, Entresto -Blood pressure is elevated -Dose of Coreg was changed to 6.25 mg p.o. twice daily    Bipolar disorder -Continue Zyprexa at bedtime  History  of suspected seizures -Patient refusing to take Keppra, states he does not have history of seizures  Homeless issues -Social work consulted for help with homelessness  Medications     atorvastatin  40 mg Oral q1800   carvedilol  6.25 mg Oral BID WC   enoxaparin (LOVENOX) injection  40 mg Subcutaneous Q24H   insulin aspart  0-9 Units Subcutaneous TID WC   levETIRAcetam  500 mg Oral BID   OLANZapine  5 mg Oral Daily   sacubitril-valsartan  1 tablet Oral BID   spironolactone  12.5 mg Oral Daily     Data Reviewed:   CBG:  Recent Labs  Lab 12/06/21 0813 12/06/21 1228 12/06/21 1726 12/07/21 0736 12/07/21 1127  GLUCAP 105* 155* 120* 106* 157*    SpO2: 99 %    Vitals:   12/06/21 1500 12/06/21 2126 12/07/21 0535 12/07/21 1333  BP: (!) 159/95 (!) 132/91 (!) 148/90 (!) 154/96  Pulse: 72 72 68 83  Resp: 16 20 18 17   Temp: 97.9 F (36.6 C) 98.1 F (36.7 C) 97.8 F (36.6 C) 98.1 F (36.7 C)  TempSrc: Oral Oral Oral   SpO2: 100% 99% 98% 99%     Intake/Output Summary (Last 24 hours) at 12/07/2021 1555 Last data filed at 12/07/2021 1300 Gross per 24 hour  Intake 480 ml  Output --  Net 480 ml    01/05 1901 - 01/07 0700 In: 120 [P.O.:120] Out: -   There were no vitals filed for this visit.  Data Reviewed: Basic Metabolic Panel: Recent Labs  Lab 12/02/21 1706 12/04/21 1203  NA 136 136  K 3.6 4.1  CL 107 107  CO2 21* 24  GLUCOSE 77 92  BUN 12  13  CREATININE 0.91 1.06  CALCIUM 8.6* 8.5*  MG  --  2.0   Liver Function Tests: Recent Labs  Lab 12/04/21 1203  AST 15  ALT 9  ALKPHOS 64  BILITOT 1.0  PROT 6.9  ALBUMIN 3.1*   No results for input(s): LIPASE, AMYLASE in the last 168 hours. No results for input(s): AMMONIA in the last 168 hours. CBC: Recent Labs  Lab 12/02/21 1706 12/04/21 1203  WBC 6.7 5.3  NEUTROABS 3.7 2.3  HGB 12.9* 13.3  HCT 38.0* 40.4  MCV 95.0 95.3  PLT 354 322   Cardiac Enzymes: No results for input(s): CKTOTAL, CKMB,  CKMBINDEX, TROPONINI in the last 168 hours. BNP (last 3 results) Recent Labs    11/02/21 1714 11/07/21 1516 11/23/21 0049  BNP 100.0 40.6 41.3    ProBNP (last 3 results) No results for input(s): PROBNP in the last 8760 hours.  CBG: Recent Labs  Lab 12/06/21 0813 12/06/21 1228 12/06/21 1726 12/07/21 0736 12/07/21 1127  GLUCAP 105* 155* 120* 106* 157*       Radiology Reports  No results found.     Antibiotics: Anti-infectives (From admission, onward)    None         DVT prophylaxis: Lovenox  Code Status: Full code  Family Communication: No family at bedside   Consultants:   Procedures:     Objective    Physical Examination:  General-appears in no acute distress Heart-S1-S2, regular, no murmur auscultated Lungs-clear to auscultation bilaterally, no wheezing or crackles auscultated Abdomen-soft, nontender, no organomegaly Extremities-to large ulcer noted on the lower extremities, ulcer on the anterior aspect of lower extremity  has clean base with healthy granulation tissue Neuro-alert, oriented x3, no focal deficit noted  Status is: Inpatient  Dispo: The patient is from: Home              Anticipated d/c is to: Home              Anticipated d/c date is: 12/09/2021              Patient currently not stable for discharge  Barrier to discharge-ongoing treatment for right leg ulcers, pain control  COVID-19 Labs  No results for input(s): DDIMER, FERRITIN, LDH, CRP in the last 72 hours.   Lab Results  Component Value Date   SARSCOV2NAA NEGATIVE 12/03/2021   [place] NEGATIVE 11/25/2021   [place] NEGATIVE 11/23/2021   [place] NEGATIVE 11/02/2021            Recent Results (from the past 240 hour(s))  Culture, blood (routine x 2)     Status: None (Preliminary result)   Collection Time: 12/03/21  5:24 AM   Specimen: BLOOD  Result Value Ref Range Status   Specimen Description   Final    BLOOD LEFT  ANTECUBITAL Performed at [facility] [address], [place], [place] [address]    Special Requests   Final    BOTTLES DRAWN AEROBIC ONLY Blood Culture adequate volume Performed at [facility] [address], [place], [place] [address]    Culture   Final    NO GROWTH 4 DAYS Performed at [facility], [place] [address] [place], [place] [address]    Report Status PENDING  Incomplete  Culture, blood (routine x 2)     Status: None (Preliminary result)   Collection Time: 12/03/21  5:30 AM   Specimen: BLOOD  Result Value Ref Range Status   Specimen Description  Final    BLOOD RIGHT ANTECUBITAL Performed at [facility] [address] [place], [place] [address]    Special Requests   Final    BOTTLES DRAWN AEROBIC AND ANAEROBIC Blood Culture adequate volume Performed at [facility] [address] [place] [address] [place], [place] [address]    Culture   Final    NO GROWTH 4 DAYS Performed at [facility], [place] [address], [place], [place] [address]    Report Status PENDING  Incomplete  Resp Panel by RT-PCR (Flu A&B, Covid) Nasopharyngeal Swab     Status: None   Collection Time: 12/03/21  9:19 AM   Specimen: Nasopharyngeal Swab; Nasopharyngeal(NP) swabs in vial transport medium  Result Value Ref Range Status   SARS Coronavirus 2 by RT PCR NEGATIVE NEGATIVE Final    Comment: (NOTE) SARS-CoV-2 target nucleic acids are NOT DETECTED.  The SARS-CoV-2 RNA is generally detectable in upper respiratory specimens during the acute phase of infection. The lowest concentration of SARS-CoV-2 viral copies this assay can detect is 138 copies/mL. A negative result does not preclude SARS-Cov-2 infection and should not be used as the sole basis for treatment or other patient management decisions. A negative result may occur with  improper specimen collection/handling, submission of specimen other than nasopharyngeal swab,  presence of viral mutation(s) within the areas targeted by this assay, and inadequate number of viral copies(<138 copies/mL). A negative result must be combined with clinical observations, patient history, and epidemiological information. The expected result is Negative.  Fact Sheet for Patients:  EntrepreneurPulse.com.au  Fact Sheet for Healthcare Providers:  IncredibleEmployment.be  This test is no t yet approved or cleared by the [place] FDA and  has been authorized for detection and/or diagnosis of SARS-CoV-2 by FDA under an Emergency Use Authorization (EUA). This EUA will remain  in effect (meaning this test can be used) for the duration of the COVID-19 declaration under Section 564(b)(1) of the Act, 21 U.S.C.section 360bbb-3(b)(1), unless the authorization is terminated  or revoked sooner.       Influenza A by PCR NEGATIVE NEGATIVE Final   Influenza B by PCR NEGATIVE NEGATIVE Final    Comment: (NOTE) The Xpert Xpress SARS-CoV-2/FLU/RSV plus assay is intended as an aid in the diagnosis of influenza from Nasopharyngeal swab specimens and should not be used as a sole basis for treatment. Nasal washings and aspirates are unacceptable for Xpert Xpress SARS-CoV-2/FLU/RSV testing.  Fact Sheet for Patients: EntrepreneurPulse.com.au  Fact Sheet for Healthcare Providers: IncredibleEmployment.be  This test is not yet approved or cleared by the [place] FDA and has been authorized for detection and/or diagnosis of SARS-CoV-2 by FDA under an Emergency Use Authorization (EUA). This EUA will remain in effect (meaning this test can be used) for the duration of the COVID-19 declaration under Section 564(b)(1) of the Act, 21 U.S.C. section 360bbb-3(b)(1), unless the authorization is terminated or revoked.  Performed at [facility], [place] [address] [place], [name] [address]      [name]   [facility] If 7PM-7AM, please contact night-coverage at [email or web address], Office  [phone]   12/07/2021, 3:55 PM  LOS: 3 days

## 2021-12-08 DIAGNOSIS — S81809A Unspecified open wound, unspecified lower leg, initial encounter: Secondary | ICD-10-CM | POA: Diagnosis not present

## 2021-12-08 DIAGNOSIS — I10 Essential (primary) hypertension: Secondary | ICD-10-CM | POA: Diagnosis not present

## 2021-12-08 DIAGNOSIS — L03115 Cellulitis of right lower limb: Secondary | ICD-10-CM | POA: Diagnosis not present

## 2021-12-08 DIAGNOSIS — I5042 Chronic combined systolic (congestive) and diastolic (congestive) heart failure: Secondary | ICD-10-CM | POA: Diagnosis not present

## 2021-12-08 LAB — CULTURE, BLOOD (ROUTINE X 2)
Culture: NO GROWTH
Culture: NO GROWTH
Special Requests: ADEQUATE
Special Requests: ADEQUATE

## 2021-12-08 LAB — GLUCOSE, CAPILLARY
Glucose-Capillary: 105 mg/dL — ABNORMAL HIGH (ref 70–99)
Glucose-Capillary: 109 mg/dL — ABNORMAL HIGH (ref 70–99)
Glucose-Capillary: 182 mg/dL — ABNORMAL HIGH (ref 70–99)

## 2021-12-08 MED ORDER — SPIRONOLACTONE 25 MG PO TABS
25.0000 mg | ORAL_TABLET | Freq: Every day | ORAL | Status: DC
  Administered 2021-12-09: 25 mg via ORAL
  Filled 2021-12-08: qty 1

## 2021-12-08 MED ORDER — SPIRONOLACTONE 12.5 MG HALF TABLET
12.5000 mg | ORAL_TABLET | Freq: Once | ORAL | Status: AC
Start: 2021-12-08 — End: 2021-12-08
  Administered 2021-12-08: 12.5 mg via ORAL
  Filled 2021-12-08: qty 1

## 2021-12-08 NOTE — Progress Notes (Signed)
[facility]  PROGRESS NOTE  [name] [id number] DOB: 04/17/1966 DOA: 12/02/2021 PCP: Pcp, No   Brief HPI:   56 year old homeless male with multiple comorbidities including diabetes mellitus type 2, congestive heart failure, cocaine abuse, tobacco abuse, bipolar disorder, hypertension, hyperlipidemia, has chronic lower extremity venous insufficiency and wounds.  Patient presented to ED with complaints of bilateral leg pain.  Of note, patient was recently hospitalized at [facility] long hospital from 12/14 until 12/22 for right lower extremity cellulitis.  Patient completed a course of antibiotic treatment.  Patient received wound care throughout the hospitalization.  Patient was discharged on 12/22 and arrangements were made for outpatient follow-up on 1/6  Subjective   Patient seen and examined, leg pain has improved.   Assessment/Plan:     Bilateral lower extremity ulcers right more than left -Wound care consulted -Recommended dressing changes daily for 3 days however it was not done -Have asked nursing staff to start dressing changes from today daily for 3 days . -Today's day #3, lower extremity ulcer looks better. -Continue morphine 2 mg IV every 4 hours as needed for pain  Chronic combined systolic and diastolic CHF -Continue Entresto, Aldactone, Coreg  Cocaine use disorder -Patient uses cocaine regularly -Counseled on admission  CAD -Patient is currently chest pain-free -Continue home regimen of lipid-lowering therapy  -AV nodal blocking agent therapy  Diabetes mellitus type 2 -Continue sliding scale insulin with NovoLog -CBG well controlled  Hypertension -Continue Coreg, Aldactone, Entresto -Blood pressure is elevated -Dose of Coreg was changed to 6.25 mg p.o. twice daily    Bipolar disorder -Continue Zyprexa at bedtime  History of suspected seizures -Patient refusing to take Keppra, states he does not have history of seizures  Homeless  issues -Social work consulted for help with homelessness  Medications     atorvastatin  40 mg Oral q1800   carvedilol  6.25 mg Oral BID WC   enoxaparin (LOVENOX) injection  40 mg Subcutaneous Q24H   insulin aspart  0-9 Units Subcutaneous TID WC   levETIRAcetam  500 mg Oral BID   OLANZapine  5 mg Oral Daily   sacubitril-valsartan  1 tablet Oral BID   spironolactone  12.5 mg Oral Daily     Data Reviewed:   CBG:  Recent Labs  Lab 12/07/21 1127 12/07/21 1652 12/07/21 2017 12/08/21 0731 12/08/21 1100  GLUCAP 157* 183* 147* 105* 109*    SpO2: 98 %    Vitals:   12/07/21 0535 12/07/21 1333 12/07/21 2017 12/08/21 0346  BP: (!) 148/90 (!) 154/96 (!) 156/100 (!) 167/93  Pulse: 68 83 73 78  Resp: 18 17 (!) 21 18  Temp: 97.8 F (36.6 C) 98.1 F (36.7 C) 98.1 F (36.7 C) (!) 97.5 F (36.4 C)  TempSrc: Oral  Oral Oral  SpO2: 98% 99% 98% 98%     Intake/Output Summary (Last 24 hours) at 12/08/2021 1658 Last data filed at 12/08/2021 0900 Gross per 24 hour  Intake 240 ml  Output --  Net 240 ml    01/06 1901 - 01/08 0700 In: 480 [P.O.:480] Out: -   There were no vitals filed for this visit.  Data Reviewed: Basic Metabolic Panel: Recent Labs  Lab 12/02/21 1706 12/04/21 1203  NA 136 136  K 3.6 4.1  CL 107 107  CO2 21* 24  GLUCOSE 77 92  BUN 12 13  CREATININE 0.91 1.06  CALCIUM 8.6* 8.5*  MG  --  2.0   Liver Function Tests: Recent Labs  Lab  12/04/21 1203  AST 15  ALT 9  ALKPHOS 64  BILITOT 1.0  PROT 6.9  ALBUMIN 3.1*   No results for input(s): LIPASE, AMYLASE in the last 168 hours. No results for input(s): AMMONIA in the last 168 hours. CBC: Recent Labs  Lab 12/02/21 1706 12/04/21 1203  WBC 6.7 5.3  NEUTROABS 3.7 2.3  HGB 12.9* 13.3  HCT 38.0* 40.4  MCV 95.0 95.3  PLT 354 322   Cardiac Enzymes: No results for input(s): CKTOTAL, CKMB, CKMBINDEX, TROPONINI in the last 168 hours. BNP (last 3 results) Recent Labs    11/02/21 1714  11/07/21 1516 11/23/21 0049  BNP 100.0 40.6 41.3    ProBNP (last 3 results) No results for input(s): PROBNP in the last 8760 hours.  CBG: Recent Labs  Lab 12/07/21 1127 12/07/21 1652 12/07/21 2017 12/08/21 0731 12/08/21 1100  GLUCAP 157* 183* 147* 105* 109*       Radiology Reports  No results found.     Antibiotics: Anti-infectives (From admission, onward)    None         DVT prophylaxis: Lovenox  Code Status: Full code  Family Communication: No family at bedside   Consultants:   Procedures:     Objective    Physical Examination:  General-appears in no acute distress Heart-S1-S2, regular, no murmur auscultated Lungs-clear to auscultation bilaterally, no wheezing or crackles auscultated Abdomen-soft, nontender, no organomegaly Extremities-large ulcer on the medial aspect of right lower extremity has pink relation tissue with mild serosanguineous discharge Neuro-alert, oriented x3, no focal deficit noted  Status is: Inpatient  Dispo: The patient is from: Home              Anticipated d/c is to: Home              Anticipated d/c date is: 12/09/2021              Patient currently not stable for discharge  Barrier to discharge-ongoing treatment for right leg ulcers, pain control  COVID-19 Labs  No results for input(s): DDIMER, FERRITIN, LDH, CRP in the last 72 hours.   Lab Results  Component Value Date   SARSCOV2NAA NEGATIVE 12/03/2021   [place] NEGATIVE 11/25/2021   [place] NEGATIVE 11/23/2021   [place] NEGATIVE 11/02/2021            Recent Results (from the past 240 hour(s))  Culture, blood (routine x 2)     Status: None   Collection Time: 12/03/21  5:24 AM   Specimen: BLOOD  Result Value Ref Range Status   Specimen Description   Final    BLOOD LEFT ANTECUBITAL Performed at [facility] [address], [place], [place] [address]    Special Requests   Final    BOTTLES DRAWN AEROBIC ONLY  Blood Culture adequate volume Performed at [facility] [address] [place], [place] [address]    Culture   Final    NO GROWTH 5 DAYS Performed at [facility], [place] [address], [place], [place] [address]    Report Status 12/08/2021 FINAL  Final  Culture, blood (routine x 2)     Status: None   Collection Time: 12/03/21  5:30 AM   Specimen: BLOOD  Result Value Ref Range Status   Specimen Description   Final    BLOOD RIGHT ANTECUBITAL Performed at [facility] [address], [place], [place] [address]    Special Requests   Final    BOTTLES  DRAWN AEROBIC AND ANAEROBIC Blood Culture adequate volume Performed at [facility] [address] [place], [place] [address]    Culture   Final    NO GROWTH 5 DAYS Performed at [facility], [place] [address] [place], [place] [address]    Report Status 12/08/2021 FINAL  Final  Resp Panel by RT-PCR (Flu A&B, Covid) Nasopharyngeal Swab     Status: None   Collection Time: 12/03/21  9:19 AM   Specimen: Nasopharyngeal Swab; Nasopharyngeal(NP) swabs in vial transport medium  Result Value Ref Range Status   SARS Coronavirus 2 by RT PCR NEGATIVE NEGATIVE Final    Comment: (NOTE) SARS-CoV-2 target nucleic acids are NOT DETECTED.  The SARS-CoV-2 RNA is generally detectable in upper respiratory specimens during the acute phase of infection. The lowest concentration of SARS-CoV-2 viral copies this assay can detect is 138 copies/mL. A negative result does not preclude SARS-Cov-2 infection and should not be used as the sole basis for treatment or other patient management decisions. A negative result may occur with  improper specimen collection/handling, submission of specimen other than nasopharyngeal swab, presence of viral mutation(s) within the areas targeted by this assay, and inadequate number of viral copies(<138 copies/mL). A negative result must be combined with clinical  observations, patient history, and epidemiological information. The expected result is Negative.  Fact Sheet for Patients:  EntrepreneurPulse.com.au  Fact Sheet for Healthcare Providers:  IncredibleEmployment.be  This test is no t yet approved or cleared by the [place] FDA and  has been authorized for detection and/or diagnosis of SARS-CoV-2 by FDA under an Emergency Use Authorization (EUA). This EUA will remain  in effect (meaning this test can be used) for the duration of the COVID-19 declaration under Section 564(b)(1) of the Act, 21 U.S.C.section 360bbb-3(b)(1), unless the authorization is terminated  or revoked sooner.       Influenza A by PCR NEGATIVE NEGATIVE Final   Influenza B by PCR NEGATIVE NEGATIVE Final    Comment: (NOTE) The Xpert Xpress SARS-CoV-2/FLU/RSV plus assay is intended as an aid in the diagnosis of influenza from Nasopharyngeal swab specimens and should not be used as a sole basis for treatment. Nasal washings and aspirates are unacceptable for Xpert Xpress SARS-CoV-2/FLU/RSV testing.  Fact Sheet for Patients: EntrepreneurPulse.com.au  Fact Sheet for Healthcare Providers: IncredibleEmployment.be  This test is not yet approved or cleared by the [place] FDA and has been authorized for detection and/or diagnosis of SARS-CoV-2 by FDA under an Emergency Use Authorization (EUA). This EUA will remain in effect (meaning this test can be used) for the duration of the COVID-19 declaration under Section 564(b)(1) of the Act, 21 U.S.C. section 360bbb-3(b)(1), unless the authorization is terminated or revoked.  Performed at [facility], [place] [address] [place], [place] [address]     [name]   [facility] If 7PM-7AM, please contact night-coverage at [email or web address], Office  [phone]   12/08/2021, 4:58 PM  LOS: 4 days

## 2021-12-09 ENCOUNTER — Other Ambulatory Visit (HOSPITAL_COMMUNITY): Payer: Self-pay

## 2021-12-09 DIAGNOSIS — S81809A Unspecified open wound, unspecified lower leg, initial encounter: Secondary | ICD-10-CM | POA: Diagnosis not present

## 2021-12-09 DIAGNOSIS — I10 Essential (primary) hypertension: Secondary | ICD-10-CM | POA: Diagnosis not present

## 2021-12-09 DIAGNOSIS — L03115 Cellulitis of right lower limb: Secondary | ICD-10-CM | POA: Diagnosis not present

## 2021-12-09 DIAGNOSIS — I5042 Chronic combined systolic (congestive) and diastolic (congestive) heart failure: Secondary | ICD-10-CM | POA: Diagnosis not present

## 2021-12-09 LAB — GLUCOSE, CAPILLARY: Glucose-Capillary: 110 mg/dL — ABNORMAL HIGH (ref 70–99)

## 2021-12-09 MED ORDER — OXYCODONE HCL 5 MG PO TABS: 5.0000 mg | ORAL_TABLET | Freq: Four times a day (QID) | ORAL | 0 refills | Status: DC | PRN

## 2021-12-09 MED ORDER — SPIRONOLACTONE 25 MG PO TABS
25.0000 mg | ORAL_TABLET | Freq: Every day | ORAL | 3 refills | Status: DC
  Filled 2021-12-09: qty 30, 30d supply, fill #0

## 2021-12-09 MED ORDER — CARVEDILOL 6.25 MG PO TABS
6.2500 mg | ORAL_TABLET | Freq: Two times a day (BID) | ORAL | 2 refills | Status: DC
Start: 2021-12-09 — End: 2022-03-07
  Filled 2021-12-09 – 2022-03-07 (×2): qty 60, 30d supply, fill #0

## 2021-12-09 NOTE — Progress Notes (Signed)
Pt refused assessment. Pt states, "he's ready to go". Pt refused prevalon boots upon discharge. PIV removed. AVS printed and educational teaching completed. Pt stated, "I can't wait in my room for cab voucher. I want to wait outside". AC was notified that patient is in need of cab voucher, since CM is not available at this time.

## 2021-12-09 NOTE — Progress Notes (Addendum)
Pt refused to wait in room for transportation. Pt stated, "i'm waiting outside. It's too hot up here" for transportation to pick him up. Pt is wheelchair bound and was made aware of safety concerns.

## 2021-12-09 NOTE — Progress Notes (Signed)
Zyprexa (5MG  TAB)  wasted with second RN verification , LPN). Pt stated, "im not taking that pill".

## 2021-12-09 NOTE — Discharge Summary (Signed)
Physician Discharge Summary  [name] ZOX:[id number] DOB: [birth year] DOA: 12/02/2021  PCP: Pcp, No  Admit date: 12/02/2021 Discharge date: 12/09/2021  Time spent: 60 minutes  Recommendations for Outpatient Follow-up:  [facility], call to make an appointment Home health RN for wound care   Discharge Diagnoses:  Principal Problem:   Open wounds involving multiple regions of lower extremity Active Problems:   Schizoaffective disorder, bipolar type (HCC)   Cocaine use disorder, severe, dependence (HCC)   Coronary artery disease involving native coronary artery of native heart without angina pectoris   Essential hypertension   Chronic combined systolic and diastolic congestive heart failure (HCC)   Type 2 diabetes mellitus with diabetic polyneuropathy, without long-term current use of insulin (HCC)   Mixed diabetic hyperlipidemia associated with type 2 diabetes mellitus (HCC)   Wound infection   Discharge Condition: Stable  Diet recommendation: Heart healthy diet  There were no vitals filed for this visit.  History of present illness:  56 year old homeless male with multiple comorbidities including diabetes mellitus type 2, congestive heart failure, cocaine abuse, tobacco abuse, bipolar disorder, hypertension, hyperlipidemia, has chronic lower extremity venous insufficiency and wounds.  Patient presented to ED with complaints of bilateral leg pain.   Of note, patient was recently hospitalized at [facility] from 12/14 until 12/22 for right lower extremity cellulitis.  Patient completed a course of antibiotic treatment.  Patient received wound care throughout the hospitalization.  Patient was discharged on 12/22 and arrangements were made for outpatient follow-up on 1/6  Hospital Course:  Bilateral lower extremity ulcers right more than left -Wound care consulted -Recommended dressing changes daily f -Ulcer looks better -Patient wants to go  home -We will arrange for home health RN for dressing changes at home -Patient can follow-up with PCP clinic, will have to call to reschedule appointment -We will give pain medication oxycodone 5 mg p.o. every 6 hours as needed, 20 tablets only   Chronic combined systolic and diastolic CHF -Continue Entresto, Aldactone, Coreg   Cocaine use disorder -Patient uses cocaine regularly -Counseled on admission   CAD -Patient is currently chest pain-free -Continue home regimen of lipid-lowering therapy  -AV nodal blocking agent therapy   Diabetes mellitus type 2 -Continue sliding scale insulin with NovoLog -CBG well controlled   Hypertension -Continue Coreg, Aldactone, Entresto -Blood pressure is elevated -Dose of Coreg was changed to 6.25 mg p.o. twice daily  -Dose of Aldactone changed to 25 mg p.o. daily     Bipolar disorder -Continue Zyprexa at bedtime   History of suspected seizures -Patient refusing to take Keppra, states he does not have history of seizures   Homeless issues -Social work consulted for help with homelessness    Procedures:   Consultations:   Discharge Exam: Vitals:   12/08/21 2111 12/09/21 0508  BP: (!) 144/88 (!) 151/109  Pulse: 83 77  Resp: 16 18  Temp: 98.1 F (36.7 C)   SpO2: 97% 100%    General: Appears in no acute distress Cardiovascular: S1-S2, regular, no murmur auscultated Respiratory: Clear to auscultation bilaterally  Discharge Instructions   Discharge Instructions     Diet - low sodium heart healthy   Complete by: As directed    Discharge wound care:   Complete by: As directed    Wound care to Right LE:  Cleanse with NS, pat dry. Cover ulcerated areas at medial and lateral LE with folded pieces of xeroform gauze [place] # [id number]), top with ABD pads and secure  with a few turns of Kerlix roll gauze wrapped from just below toes to just below knee. Place right foot into Prevalon Boot   Increase activity slowly   Complete by: As  directed       Allergies as of 12/09/2021       Reactions   Ibuprofen Anaphylaxis   Acetaminophen Hives, Nausea And Vomiting, Rash   Pt overdosed on bottle of APAP 05/06/16   Lyrica Cr [pregabalin Er] Other (See Comments)   Dystonia and dysarthria   Peanut-containing Drug Products Other (See Comments)   Pt denies allergy to peanut butter 03/02/2021        Medication List     STOP taking these medications    doxycycline 100 MG capsule Commonly known as: VIBRAMYCIN       TAKE these medications    albuterol 108 (90 Base) MCG/ACT inhaler Commonly known as: VENTOLIN HFA Inhale 2 puffs into the lungs every 6 (six) hours as needed for wheezing or shortness of breath.   atorvastatin 10 MG tablet Commonly known as: LIPITOR Take 4 tablets (40 mg total) by mouth daily.   carvedilol 6.25 MG tablet Commonly known as: COREG Take 1 tablet (6.25 mg total) by mouth 2 (two) times daily with a meal. What changed:  medication strength how much to take   Entresto 24-26 MG Generic drug: sacubitril-valsartan Take 1 tablet by mouth 2 (two) times daily.   Incruse Ellipta 62.5 MCG/ACT Aepb Generic drug: umeclidinium bromide Inhale 1 puff into the lungs daily.   levETIRAcetam 500 MG tablet Commonly known as: KEPPRA Take 1 tablet (500 mg total) by mouth 2 (two) times daily.   OLANZapine 5 MG tablet Commonly known as: ZYPREXA Take 1 tablet (5 mg total) by mouth daily.   oxyCODONE 5 MG immediate release tablet Commonly known as: Oxy IR/ROXICODONE Take 1 tablet (5 mg total) by mouth every 6 (six) hours as needed for severe pain or breakthrough pain.   pregabalin 100 MG capsule Commonly known as: LYRICA Take 100 mg by mouth 3 (three) times daily.   spironolactone 25 MG tablet Commonly known as: ALDACTONE Take 1 tablet (25 mg total) by mouth daily. What changed: how much to take               Discharge Care Instructions  (From admission, onward)           Start      Ordered   12/09/21 0000  Discharge wound care:       Comments: Wound care to Right LE:  Cleanse with NS, pat dry. Cover ulcerated areas at medial and lateral LE with folded pieces of xeroform gauze [place] # 294), top with ABD pads and secure with a few turns of Kerlix roll gauze wrapped from just below toes to just below knee. Place right foot into Prevalon Boot   12/09/21 0809           Allergies  Allergen Reactions   Ibuprofen Anaphylaxis   Acetaminophen Hives, Nausea And Vomiting and Rash    Pt overdosed on bottle of APAP 05/06/16    Lyrica Cr [Pregabalin Er] Other (See Comments)    Dystonia and dysarthria   Peanut-Containing Drug Products Other (See Comments)    Pt denies allergy to peanut butter 03/02/2021      The results of significant diagnostics from this hospitalization (including imaging, microbiology, ancillary and laboratory) are listed below for reference.    Significant Diagnostic Studies: DG Chest 2 View  Result  Date: 11/13/2021 CLINICAL DATA:  Suspected sepsis EXAM: CHEST - 2 VIEW COMPARISON:  11/02/2021 FINDINGS: Apparent shift of mediastinum to the right may be due to rotation. Cardiac size is within normal limits. There are no signs of pulmonary edema or new focal infiltrates. There is no pleural effusion or pneumothorax. IMPRESSION: No active cardiopulmonary disease. Electronically Signed   By: Ernie Avena M.D.   On: 11/13/2021 13:22   DG Ankle Complete Right  Result Date: 12/03/2021 CLINICAL DATA:  Right foot pain. Foot and ankle cellulitis. Swelling. EXAM: RIGHT ANKLE - COMPLETE 3+ VIEW COMPARISON:  None. FINDINGS: The ankle mortise is preserved. No fracture. No erosion, periosteal reaction, or bone destruction. No ankle joint effusion. Prominent talar ridge. Generalized soft tissue edema. No soft tissue air or radiopaque foreign body. IMPRESSION: Generalized soft tissue edema. No acute osseous abnormality or evidence of osteomyelitis. Electronically  Signed   By: Narda Rutherford M.D.   On: 12/03/2021 01:58   CT TIBIA FIBULA LEFT W CONTRAST  Result Date: 11/16/2021 CLINICAL DATA:  Insert his EXAM: CT OF THE LOWER LEFT EXTREMITY WITH CONTRAST CT OF THE LOWER LEFT FOOT WITH CONTRAST TECHNIQUE: Multidetector CT imaging of the lower left extremity was performed according to the standard protocol following intravenous contrast administration. CONTRAST:  OMNIPAQUE IOHEXOL 350 MG/ML SOLN COMPARISON:  Left foot radiograph 11/15/2021 FINDINGS: Bones/Joint/Cartilage Prior great toe amputation. There is no frank bony destruction in the left foot. There is mild hindfoot osteoarthritis. Dorsal midfoot spurring. Plantar calcaneal spurring. There is no acute osseous abnormality involving the tibia or fibula. 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 There is chronic fragmentation of the tibial tuberosity likely sequela of prior Osgood-Schlatter. Ligaments Suboptimally assessed by CT. Muscles and Tendons No muscle atrophy.  No intramuscular collection. Soft tissues Soft tissue swelling of the foot superficially into a lesser degree along the lower leg with skin thickening. No well-defined fluid collection on CT. No soft tissue gas. IMPRESSION: Skin thickening and superficial soft tissue swelling of the foot and to a lesser degree the lower leg, as can be seen in cellulitis or lymphedema. Prior great toe amputation with adjacent soft tissue thickening. No evidence of soft tissue abscess or bony destruction to suggest osteomyelitis on CT. MRI would be more sensitive. Electronically Signed   By: Caprice Renshaw M.D.   On: 11/16/2021 18:00   CT FOOT RIGHT W CONTRAST  Result Date: 11/16/2021 CLINICAL DATA:  Soft tissue infection suspected, lower leg, xray done; Foot swelling, diabetic, osteomyelitis suspected, xray done EXAM: CT OF THE LOWER RIGHT EXTREMITY WITH CONTRAST CT OF THE RIGHT FOOT WITH CONTRAST TECHNIQUE: Multidetector CT imaging of the lower  right extremity and right foot was performed according to the standard protocol following intravenous contrast administration. CONTRAST:  OMNIPAQUE IOHEXOL 350 MG/ML SOLN COMPARISON:  Right foot radiograph 11/15/2021 FINDINGS: Bones/Joint/Cartilage Prior great toe amputation. No frank bony destruction in the foot. Mild hindfoot osteoarthritis. Os trigonum. Plantar calcaneal spur. Dorsal midfoot spurring. There is no acute osseous abnormality involving the tibia or fibula. There is chronic tibial tuberosity fragmentation likely sequela of prior Osgood-Schlatter. There is no frank bony destruction to suggest osteomyelitis on CT. There is mild medial patellofemoral predominant osteoarthritis of the knee. Ligaments Suboptimally assessed by CT. Muscles and Tendons No muscle atrophy.  No intramuscular collection. Soft tissues There is skin thickening and soft tissue swelling of the right foot and to a lesser degree of the lower leg. There is no rim enhancing fluid collection.  No soft tissue gas. IMPRESSION: Skin thickening and soft tissue swelling of the right foot and to a lesser degree of the right lower leg, as can be seen in cellulitis or lymphedema. Prior great toe amputation with adjacent soft tissue thickening. No evidence of soft tissue abscess or bony destruction to suggest osteomyelitis on CT. MRI would be more sensitive. Electronically Signed   By: Caprice Renshaw M.D.   On: 11/16/2021 18:06   CT FOOT LEFT W CONTRAST  Result Date: 11/16/2021 CLINICAL DATA:  Insert his EXAM: CT OF THE LOWER LEFT EXTREMITY WITH CONTRAST CT OF THE LOWER LEFT FOOT WITH CONTRAST TECHNIQUE: Multidetector CT imaging of the lower left extremity was performed according to the standard protocol following intravenous contrast administration. CONTRAST:  OMNIPAQUE IOHEXOL 350 MG/ML SOLN COMPARISON:  Left foot radiograph 11/15/2021 FINDINGS: Bones/Joint/Cartilage Prior great toe amputation. There is no frank bony destruction in  the left foot. There is mild hindfoot osteoarthritis. Dorsal midfoot spurring. Plantar calcaneal spurring. There is no acute osseous abnormality involving the tibia or fibula. 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 There is chronic fragmentation of the tibial tuberosity likely sequela of prior Osgood-Schlatter. Ligaments Suboptimally assessed by CT. Muscles and Tendons No muscle atrophy.  No intramuscular collection. Soft tissues Soft tissue swelling of the foot superficially into a lesser degree along the lower leg with skin thickening. No well-defined fluid collection on CT. No soft tissue gas. IMPRESSION: Skin thickening and superficial soft tissue swelling of the foot and to a lesser degree the lower leg, as can be seen in cellulitis or lymphedema. Prior great toe amputation with adjacent soft tissue thickening. No evidence of soft tissue abscess or bony destruction to suggest osteomyelitis on CT. MRI would be more sensitive. Electronically Signed   By: Caprice Renshaw M.D.   On: 11/16/2021 18:00   CT TIBIA FIBULA RIGHT W CONTRAST  Result Date: 11/16/2021 CLINICAL DATA:  Soft tissue infection suspected, lower leg, xray done; Foot swelling, diabetic, osteomyelitis suspected, xray done EXAM: CT OF THE LOWER RIGHT EXTREMITY WITH CONTRAST CT OF THE RIGHT FOOT WITH CONTRAST TECHNIQUE: Multidetector CT imaging of the lower right extremity and right foot was performed according to the standard protocol following intravenous contrast administration. CONTRAST:  OMNIPAQUE IOHEXOL 350 MG/ML SOLN COMPARISON:  Right foot radiograph 11/15/2021 FINDINGS: Bones/Joint/Cartilage Prior great toe amputation. No frank bony destruction in the foot. Mild hindfoot osteoarthritis. Os trigonum. Plantar calcaneal spur. Dorsal midfoot spurring. There is no acute osseous abnormality involving the tibia or fibula. There is chronic tibial tuberosity fragmentation likely sequela of prior Osgood-Schlatter. There is no  frank bony destruction to suggest osteomyelitis on CT. There is mild medial patellofemoral predominant osteoarthritis of the knee. Ligaments Suboptimally assessed by CT. Muscles and Tendons No muscle atrophy.  No intramuscular collection. Soft tissues There is skin thickening and soft tissue swelling of the right foot and to a lesser degree of the lower leg. There is no rim enhancing fluid collection. No soft tissue gas. IMPRESSION: Skin thickening and soft tissue swelling of the right foot and to a lesser degree of the right lower leg, as can be seen in cellulitis or lymphedema. Prior great toe amputation with adjacent soft tissue thickening. No evidence of soft tissue abscess or bony destruction to suggest osteomyelitis on CT. MRI would be more sensitive. Electronically Signed   By: Caprice Renshaw M.D.   On: 11/16/2021 18:06   DG Chest Portable 1 View  Result Date: 11/23/2021 CLINICAL DATA:  Shortness of breath EXAM: PORTABLE CHEST 1 VIEW COMPARISON:  11/13/2021 FINDINGS: The heart size and mediastinal contours are within normal limits. Both lungs are clear. The visualized skeletal structures are unremarkable. IMPRESSION: No active disease. Electronically Signed   By: Deatra Robinson M.D.   On: 11/23/2021 01:34   DG Foot Complete Left  Result Date: 11/15/2021 CLINICAL DATA:  Diabetic foot.  Cellulitis EXAM: LEFT FOOT - COMPLETE 3+ VIEW COMPARISON:  02/19/2021 FINDINGS: AP taken of the great toe at the level of the first MTP. No change in appearance from the prior study. No fracture or osteomyelitis. Remaining toes appear negative. Soft tissue swelling of the midfoot. IMPRESSION: Negative for fracture or osteomyelitis. Amputation of the great toe. Soft tissue swelling of the midfoot. Electronically Signed   By: Marlan Palau M.D.   On: 11/15/2021 15:05   DG Foot Complete Right  Result Date: 12/03/2021 CLINICAL DATA:  Right foot pain. Foot and ankle cellulitis. Swelling with ulcers and sloughing skin. EXAM:  RIGHT FOOT COMPLETE - 3+ VIEW COMPARISON:  Most recent radiograph 11/15/2021 FINDINGS: Post prior resection of the great toe. No erosion, periosteal reaction, or bony destruction to suggest osteomyelitis. Spurring of the dorsal talus. Plantar calcaneal spur. No fracture. Generalized soft tissue edema. No soft tissue air or radiopaque foreign body. IMPRESSION: 1. Generalized soft tissue edema. No soft tissue air or radiopaque foreign body. 2. Post prior resection of the great toe. No radiographic evidence of osteomyelitis. Electronically Signed   By: Narda Rutherford M.D.   On: 12/03/2021 01:57   DG Foot Complete Right  Result Date: 11/15/2021 CLINICAL DATA:  Diabetic foot.  Cellulitis EXAM: RIGHT FOOT COMPLETE - 3+ VIEW COMPARISON:  02/19/2021 FINDINGS: Amputation of the great toe at the level the first MTP. Negative for fracture or osteomyelitis. Remaining toes are negative. IMPRESSION: Negative for fracture or osteomyelitis.  Amputation great toe. Electronically Signed   By: Marlan Palau M.D.   On: 11/15/2021 15:06    Microbiology: Recent Results (from the past 240 hour(s))  Culture, blood (routine x 2)     Status: None   Collection Time: 12/03/21  5:24 AM   Specimen: BLOOD  Result Value Ref Range Status   Specimen Description   Final    BLOOD LEFT ANTECUBITAL Performed at [facility], [address] [place], [name] [address]    Special Requests   Final    BOTTLES DRAWN AEROBIC ONLY Blood Culture adequate volume Performed at [facility], [address], [place], [name] [address]    Culture   Final    NO GROWTH 5 DAYS Performed at [facility], [address] [place], [name] [address]    Report Status 12/08/2021 FINAL  Final  Culture, blood (routine x 2)     Status: None   Collection Time: 12/03/21  5:30 AM   Specimen: BLOOD  Result Value Ref Range Status   Specimen Description   Final    BLOOD RIGHT ANTECUBITAL Performed at [facility], [address], [place], [name] [address]    Special Requests   Final    BOTTLES DRAWN AEROBIC AND ANAEROBIC Blood Culture adequate volume Performed at [facility], [address] [place], [name] [address]    Culture   Final    NO GROWTH 5 DAYS Performed at [facility], [address] [place], [name] [address]    Report Status 12/08/2021 FINAL  Final  Resp  Panel by RT-PCR (Flu A&B, Covid) Nasopharyngeal Swab     Status: None   Collection Time: 12/03/21  9:19 AM   Specimen: Nasopharyngeal Swab; Nasopharyngeal(NP) swabs in vial transport medium  Result Value Ref Range Status   SARS Coronavirus 2 by RT PCR NEGATIVE NEGATIVE Final    Comment: (NOTE) SARS-CoV-2 target nucleic acids are NOT DETECTED.  The SARS-CoV-2 RNA is generally detectable in upper respiratory specimens during the acute phase of infection. The lowest concentration of SARS-CoV-2 viral copies this assay can detect is 138 copies/mL. A negative result does not preclude SARS-Cov-2 infection and should not be used as the sole basis for treatment or other patient management decisions. A negative result may occur with  improper specimen collection/handling, submission of specimen other than nasopharyngeal swab, presence of viral mutation(s) within the areas targeted by this assay, and inadequate number of viral copies(<138 copies/mL). A negative result must be combined with clinical observations, patient history, and epidemiological information. The expected result is Negative.  Fact Sheet for Patients:  BloggerCourse.com  Fact Sheet for Healthcare Providers:  SeriousBroker.it  This test is no t yet approved or cleared by the [place] FDA and  has been authorized for detection and/or diagnosis of SARS-CoV-2 by FDA under an Emergency Use Authorization (EUA). This EUA will remain  in effect (meaning this test  can be used) for the duration of the COVID-19 declaration under Section 564(b)(1) of the Act, 21 U.S.C.section 360bbb-3(b)(1), unless the authorization is terminated  or revoked sooner.       Influenza A by PCR NEGATIVE NEGATIVE Final   Influenza B by PCR NEGATIVE NEGATIVE Final    Comment: (NOTE) The Xpert Xpress SARS-CoV-2/FLU/RSV plus assay is intended as an aid in the diagnosis of influenza from Nasopharyngeal swab specimens and should not be used as a sole basis for treatment. Nasal washings and aspirates are unacceptable for Xpert Xpress SARS-CoV-2/FLU/RSV testing.  Fact Sheet for Patients: BloggerCourse.com  Fact Sheet for Healthcare Providers: SeriousBroker.it  This test is not yet approved or cleared by the [place] FDA and has been authorized for detection and/or diagnosis of SARS-CoV-2 by FDA under an Emergency Use Authorization (EUA). This EUA will remain in effect (meaning this test can be used) for the duration of the COVID-19 declaration under Section 564(b)(1) of the Act, 21 U.S.C. section 360bbb-3(b)(1), unless the authorization is terminated or revoked.  Performed at [facility], [address] [place], [name] [address]      Labs: Basic Metabolic Panel: Recent Labs  Lab 12/02/21 1706 12/04/21 1203  NA 136 136  K 3.6 4.1  CL 107 107  CO2 21* 24  GLUCOSE 77 92  BUN 12 13  CREATININE 0.91 1.06  CALCIUM 8.6* 8.5*  MG  --  2.0   Liver Function Tests: Recent Labs  Lab 12/04/21 1203  AST 15  ALT 9  ALKPHOS 64  BILITOT 1.0  PROT 6.9  ALBUMIN 3.1*   No results for input(s): LIPASE, AMYLASE in the last 168 hours. No results for input(s): AMMONIA in the last 168 hours. CBC: Recent Labs  Lab 12/02/21 1706 12/04/21 1203  WBC 6.7 5.3  NEUTROABS 3.7 2.3  HGB 12.9* 13.3  HCT 38.0* 40.4  MCV 95.0 95.3  PLT 354 322   Cardiac Enzymes: No results for input(s):  CKTOTAL, CKMB, CKMBINDEX, TROPONINI in the last 168 hours. BNP: BNP (last 3 results) Recent Labs    11/02/21 1714 11/07/21 1516 11/23/21 0049  BNP 100.0  40.6 41.3    ProBNP (last 3 results) No results for input(s): PROBNP in the last 8760 hours.  CBG: Recent Labs  Lab 12/07/21 2017 12/08/21 0731 12/08/21 1100 12/08/21 1704 12/09/21 0740  GLUCAP 147* 105* 109* 182* 110*       Signed:  Meredeth Ide MD.  [facility] 12/09/2021, 8:10 AM

## 2021-12-16 ENCOUNTER — Inpatient Hospital Stay (INDEPENDENT_AMBULATORY_CARE_PROVIDER_SITE_OTHER): Payer: Medicaid - Out of State | Admitting: Primary Care

## 2021-12-17 ENCOUNTER — Other Ambulatory Visit (HOSPITAL_COMMUNITY): Payer: Self-pay

## 2021-12-23 ENCOUNTER — Other Ambulatory Visit: Payer: Self-pay

## 2021-12-23 ENCOUNTER — Emergency Department (HOSPITAL_COMMUNITY)
Admission: EM | Admit: 2021-12-23 | Discharge: 2021-12-23 | Disposition: A | Discharge status: Home / Self Care | Payer: Medicaid Other | Attending: Emergency Medicine | Admitting: Emergency Medicine

## 2021-12-23 DIAGNOSIS — Z91010 Allergy to peanuts: Secondary | ICD-10-CM | POA: Insufficient documentation

## 2021-12-23 DIAGNOSIS — M25572 Pain in left ankle and joints of left foot: Secondary | ICD-10-CM | POA: Insufficient documentation

## 2021-12-23 DIAGNOSIS — M25571 Pain in right ankle and joints of right foot: Secondary | ICD-10-CM | POA: Insufficient documentation

## 2021-12-23 DIAGNOSIS — M79671 Pain in right foot: Secondary | ICD-10-CM

## 2021-12-23 DIAGNOSIS — G8929 Other chronic pain: Secondary | ICD-10-CM | POA: Insufficient documentation

## 2021-12-23 DIAGNOSIS — M79604 Pain in right leg: Secondary | ICD-10-CM

## 2021-12-23 MED ORDER — OXYCODONE HCL 5 MG PO TABS
5.0000 mg | ORAL_TABLET | Freq: Once | ORAL | Status: AC
  Administered 2021-12-23: 5 mg via ORAL
  Filled 2021-12-23: qty 1

## 2021-12-23 NOTE — ED Provider Notes (Signed)
[facility] DEPT Provider Note   CSN: [id number] Arrival date & time: 12/23/21  2009     History  Chief Complaint  Patient presents with   Foot Pain    Sundance Gama is a 56 y.o. male.  Patient presents chief complaint of chronic bilateral ankle pain right worse than left today.  He states he is noticed increased welling increased pain in the last 24 hours.  Otherwise denies fevers or cough or vomiting or diarrhea.  He is wheelchair-bound and otherwise homeless.  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      Home Medications Prior to Admission medications   Medication Sig Start Date End Date Taking? Authorizing Provider  albuterol (VENTOLIN HFA) 108 (90 Base) MCG/ACT inhaler Inhale 2 puffs into the lungs every 6 (six) hours as needed for wheezing or shortness of breath. 11/09/21   Wyvonnia Dusky, MD  atorvastatin (LIPITOR) 10 MG tablet Take 4 tablets (40 mg total) by mouth daily. 11/09/21   Wyvonnia Dusky, MD  carvedilol (COREG) 6.25 MG tablet Take 1 tablet (6.25 mg total) by mouth 2 (two) times daily with a meal. 12/09/21   [name], MD  levETIRAcetam (KEPPRA) 500 MG tablet Take 1 tablet (500 mg total) by mouth 2 (two) times daily. Patient not taking: Reported on 12/03/2021 11/09/21 12/09/21  Wyvonnia Dusky, MD  OLANZapine (ZYPREXA) 5 MG tablet Take 1 tablet (5 mg total) by mouth daily. Patient not taking: Reported on 12/03/2021 11/25/21   Ethelene Hal, NP  oxyCODONE (OXY IR/ROXICODONE) 5 MG immediate release tablet Take 1 tablet (5 mg total) by mouth every 6 (six) hours as needed for severe pain or breakthrough pain. 12/09/21   [name], MD  pregabalin (LYRICA) 100 MG capsule Take 100 mg by mouth 3 (three) times daily. Patient not taking: Reported on 12/03/2021    [provider]  spironolactone (ALDACTONE) 25 MG tablet Take 1 tablet (25 mg total) by mouth daily. 12/09/21   [name], MD  umeclidinium  bromide (INCRUSE ELLIPTA) 62.5 MCG/INH AEPB Inhale 1 puff into the lungs daily. Patient not taking: Reported on 11/24/2021 11/16/20   Flora Lipps, MD      Allergies    Ibuprofen, Acetaminophen, Lyrica cr [pregabalin er], and Peanut-containing drug products    Review of Systems   Review of Systems  Constitutional:  Negative for fever.  HENT:  Negative for ear pain and sore throat.   Eyes:  Negative for pain.  Respiratory:  Negative for cough.   Cardiovascular:  Negative for chest pain.  Gastrointestinal:  Negative for abdominal pain.  Genitourinary:  Negative for flank pain.  Musculoskeletal:  Negative for back pain.  Skin:  Negative for color change and rash.  Neurological:  Negative for syncope.  All other systems reviewed and are negative.  Physical Exam Updated Vital Signs BP (!) 177/98 (BP Location: Right Arm)    Pulse 98    Temp 98 F (36.7 C) (Oral)    Resp 20    Ht 5\' 9"  (1.753 m)    Wt 90 kg    SpO2 98%    BMI 29.30 kg/m  Physical Exam Constitutional:      Appearance: He is well-developed.  HENT:     Head: Normocephalic.     Nose: Nose normal.  Eyes:     Extraocular Movements: Extraocular movements intact.  Cardiovascular:     Rate and Rhythm: Normal rate.  Pulmonary:     Effort: Pulmonary effort is normal.  Musculoskeletal:     Comments: Posterior tibial pulses are brisk on Doppler evaluation bilaterally.  Normal warmth to bilateral lower extremities.  Skin:    Coloration: Skin is not jaundiced.     Comments: Discoloration various ulcers at various stages of healing on bilateral ankles right worse than left.  No purulent drainage noted.  Wounds all appear in various stages of healing and none appear fresh.  No abnormal warmth or cellulitis noted.  Neurological:     Mental Status: He is alert. Mental status is at baseline.    ED Results / Procedures / Treatments   Labs (all labs ordered are listed, but only abnormal results are displayed) Labs Reviewed -  No data to display  EKG None  Radiology No results found.  Procedures Procedures    Medications Ordered in ED Medications  oxyCODONE (Oxy IR/ROXICODONE) immediate release tablet 5 mg (has no administration in time range)    ED Course/ Medical Decision Making/ A&P                           Medical Decision Making Risk Prescription drug management.   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  However bilateral posterior tibial pulses appear intact on vascular Doppler at bedside by myself.  Given pain medication here but no prescriptions provided.  Recommending outpatient follow-up with wound clinic within the week.  Recommend return for worsening symptoms or any additional concerns.  I advised the patient that additional narcotic pain medication from the ER would not be appropriate for him.        Final Clinical Impression(s) / ED Diagnoses Final diagnoses:  Foot pain, right  Chronic pain of both lower extremities    Rx / DC Orders ED Discharge Orders     None         Luna Fuse, MD 12/23/21 2307

## 2021-12-23 NOTE — Discharge Instructions (Addendum)
Call your primary care doctor or specialist as discussed in the next 2-3 days.   Return immediately back to the ER if:  Your symptoms worsen within the next 12-24 hours. You develop new symptoms such as new fevers, persistent vomiting, new pain, shortness of breath, or new weakness or numbness, or if you have any other concerns.  

## 2021-12-23 NOTE — ED Triage Notes (Signed)
Pt BIB EMS c/o bilateral foot pain. Per report pain getting worse tonight. Per report pt notice increase swelling on right ankle. Pt denies N/V/D.   BP 180/100 HR 108 RR 20 O2sat 98% on RA CBG 122

## 2022-01-01 ENCOUNTER — Other Ambulatory Visit: Payer: Self-pay

## 2022-01-01 ENCOUNTER — Encounter (HOSPITAL_BASED_OUTPATIENT_CLINIC_OR_DEPARTMENT_OTHER): Payer: Medicaid Other | Attending: Internal Medicine | Admitting: Physician Assistant

## 2022-01-01 DIAGNOSIS — I110 Hypertensive heart disease with heart failure: Secondary | ICD-10-CM | POA: Insufficient documentation

## 2022-01-01 DIAGNOSIS — L97512 Non-pressure chronic ulcer of other part of right foot with fat layer exposed: Secondary | ICD-10-CM | POA: Diagnosis not present

## 2022-01-01 DIAGNOSIS — E1151 Type 2 diabetes mellitus with diabetic peripheral angiopathy without gangrene: Secondary | ICD-10-CM | POA: Insufficient documentation

## 2022-01-01 DIAGNOSIS — I5042 Chronic combined systolic (congestive) and diastolic (congestive) heart failure: Secondary | ICD-10-CM | POA: Diagnosis not present

## 2022-01-01 DIAGNOSIS — L97312 Non-pressure chronic ulcer of right ankle with fat layer exposed: Secondary | ICD-10-CM | POA: Insufficient documentation

## 2022-01-01 DIAGNOSIS — L97822 Non-pressure chronic ulcer of other part of left lower leg with fat layer exposed: Secondary | ICD-10-CM | POA: Insufficient documentation

## 2022-01-01 DIAGNOSIS — E11621 Type 2 diabetes mellitus with foot ulcer: Secondary | ICD-10-CM | POA: Insufficient documentation

## 2022-01-01 DIAGNOSIS — Z5900 Homelessness unspecified: Secondary | ICD-10-CM | POA: Insufficient documentation

## 2022-01-01 DIAGNOSIS — J449 Chronic obstructive pulmonary disease, unspecified: Secondary | ICD-10-CM | POA: Insufficient documentation

## 2022-01-01 NOTE — Progress Notes (Signed)
[name], [name] ([id number]) Visit Report for 01/01/2022 Abuse Risk Screen Details Patient Name: Date of Service: [facility] [place] 01/01/2022 10:00 A M Medical Record Number: [id number] Patient Account Number: [id number] Date of Birth/Sex: Treating RN: August 05, 1966 (56 y.o. [name] Primary Care Venda Dice: PCP, NO Other Clinician: Referring Florencio Hollibaugh: Treating Isobella Ascher/Extender: Skeet Simmer in Treatment: 0 Abuse Risk Screen Items Answer ABUSE RISK SCREEN: Has anyone close to you tried to hurt or harm you recentlyo No Do you feel uncomfortable with anyone in your familyo No Has anyone forced you do things that you didnt want to doo No Electronic Signature(s) Signed: 01/01/2022 5:26:07 PM By: Shawn Stall RN, BSN Entered By: Shawn Stall on 01/01/2022 10:25:38 -------------------------------------------------------------------------------- Activities of Daily Living Details Patient Name: Date of Service: [facility] [place] 01/01/2022 10:00 A M Medical Record Number: [id number] Patient Account Number: [id number] Date of Birth/Sex: Treating RN: [birth year] (56 y.o. [name] Primary Care Keishawna Carranza: PCP, NO Other Clinician: Referring Ramesses Crampton: Treating Mone Commisso/Extender: Skeet Simmer in Treatment: 0 Activities of Daily Living Items Answer Activities of Daily Living (Please select one for each item) Drive Automobile Completely Able T Medications ake Completely Able Use T elephone Completely Able Care for Appearance Need Assistance Use T oilet Completely Able Bath / Shower Completely Able Dress Self Completely Able Feed Self Completely Able Walk Not Able Get In / Out Bed Completely Able Housework Need Assistance Prepare Meals Need Assistance Handle Money Need Assistance Shop for Self Need Assistance Electronic Signature(s) Signed: 01/01/2022 5:26:07 PM By: Shawn Stall RN, BSN Entered By: Shawn Stall on 01/01/2022  10:26:13 -------------------------------------------------------------------------------- Education Screening Details Patient Name: Date of Service: [facility] [place] 01/01/2022 10:00 A M Medical Record Number: [id number] Patient Account Number: [id number] Date of Birth/Sex: Treating RN: [birth year] (56 y.o. [name] Primary Care Amol Domanski: PCP, NO Other Clinician: Referring Arina Torry: Treating Keilly Fatula/Extender: Skeet Simmer in Treatment: 0 Primary Learner Assessed: Patient Learning Preferences/Education Level/Primary Language Learning Preference: Explanation, Demonstration, Printed Material Highest Education Level: High School Preferred Language: English Cognitive Barrier Language Barrier: No Translator Needed: No Memory Deficit: No Emotional Barrier: No Cultural/Religious Beliefs Affecting Medical Care: No Physical Barrier Impaired Vision: Yes Impaired Hearing: No Decreased Hand dexterity: No Knowledge/Comprehension Knowledge Level: Medium Comprehension Level: Medium Ability to understand written instructions: Medium Ability to understand verbal instructions: Medium Motivation Anxiety Level: Calm Cooperation: Cooperative Education Importance: Acknowledges Need Interest in Health Problems: Asks Questions Perception: Coherent Willingness to Engage in Self-Management Medium Activities: Readiness to Engage in Self-Management Medium Activities: Notes Per patient had a stroke last year affected his memory and right side of body. Ellan Tess made aware. Electronic Signature(s) Signed: 01/01/2022 5:26:07 PM By: Shawn Stall RN, BSN Entered By: Shawn Stall on 01/01/2022 10:27:06 -------------------------------------------------------------------------------- Fall Risk Assessment Details Patient Name: Date of Service: [facility] [place] 01/01/2022 10:00 A M Medical Record Number: [id number] Patient Account Number: [id number] Date of Birth/Sex: Treating  RN: 1965/12/14 (56 y.o. [name] Primary Care Diamond Martucci: PCP, NO Other Clinician: Referring Liliana Dang: Treating Daesia Zylka/Extender: Skeet Simmer in Treatment: 0 Fall Risk Assessment Items Have you had 2 or more falls in the last 12 monthso 0 Yes Have you had any fall that resulted in injury in the last 12 monthso 0 No FALLS RISK SCREEN History of falling - immediate or within 3 months 25 Yes Secondary diagnosis (Do you have 2 or more medical diagnoseso) 0 No Ambulatory aid None/bed rest/wheelchair/nurse 0 Yes Crutches/cane/walker 0 No Furniture 0  No Intravenous therapy Access/Saline/Heparin Lock 0 No Gait/Transferring Normal/ bed rest/ wheelchair 0 No Weak (short steps with or without shuffle, stooped but able to lift head while walking, may seek 0 No support from furniture) Impaired (short steps with shuffle, may have difficulty arising from chair, head down, impaired 20 Yes balance) Mental Status Oriented to own ability 0 Yes Electronic Signature(s) Signed: 01/01/2022 5:26:07 PM By: Shawn Stall RN, BSN Entered By: Shawn Stall on 01/01/2022 10:27:47 -------------------------------------------------------------------------------- Foot Assessment Details Patient Name: Date of Service: [facility] [place] 01/01/2022 10:00 A M Medical Record Number: [id number] Patient Account Number: [id number] Date of Birth/Sex: Treating RN: Jun 26, 1966 (56 y.o. [name] Primary Care Bridgid Printz: PCP, NO Other Clinician: Referring Ariabella Brien: Treating Ryonna Cimini/Extender: Skeet Simmer in Treatment: 0 Foot Assessment Items Site Locations + = Sensation present, - = Sensation absent, C = Callus, U = Ulcer R = Redness, W = Warmth, M = Maceration, PU = Pre-ulcerative lesion F = Fissure, S = Swelling, D = Dryness Assessment Right: Left: Other Deformity: No No Prior Foot Ulcer: No No Prior Amputation: Yes Yes Charcot Joint: No No Ambulatory Status:  Non-ambulatory Assistance Device: Wheelchair Gait: Steady Notes bilateral great toe amputated. Electronic Signature(s) Signed: 01/01/2022 5:26:07 PM By: Shawn Stall RN, BSN Entered By: Shawn Stall on 01/01/2022 10:37:04 -------------------------------------------------------------------------------- Nutrition Risk Screening Details Patient Name: Date of Service: [facility] [place] 01/01/2022 10:00 A M Medical Record Number: [id number] Patient Account Number: [id number] Date of Birth/Sex: Treating RN: [birth year] (56 y.o. [name] Primary Care Korena Nass: PCP, NO Other Clinician: Referring Quinley Nesler: Treating Jini Horiuchi/Extender: Skeet Simmer in Treatment: 0 Height (in): 69 Weight (lbs): 195 Body Mass Index (BMI): 28.8 Nutrition Risk Screening Items Score Screening NUTRITION RISK SCREEN: I have an illness or condition that made me change the kind and/or amount of food I eat 2 Yes I eat fewer than two meals per day 0 No I eat few fruits and vegetables, or milk products 0 No I have three or more drinks of beer, liquor or wine almost every day 0 No I have tooth or mouth problems that make it hard for me to eat 0 No I don't always have enough money to buy the food I need 0 No I eat alone most of the time 0 No I take three or more different prescribed or over-the-counter drugs a day 1 Yes Without wanting to, I have lost or gained 10 pounds in the last six months 0 No I am not always physically able to shop, cook and/or feed myself 0 No Nutrition Protocols Good Risk Protocol Provide education on elevated blood Moderate Risk Protocol 0 sugars and impact on wound healing, as applicable High Risk Proctocol Risk Level: Moderate Risk Score: 3 Electronic Signature(s) Signed: 01/01/2022 5:26:07 PM By: Shawn Stall RN, BSN Entered By: Shawn Stall on 01/01/2022 10:28:12

## 2022-01-01 NOTE — Progress Notes (Signed)
[name], [name] ([id number]) Visit Report for 01/01/2022 Allergy List Details Patient Name: Date of Service: [name] [name], [name] 01/01/2022 10:00 [place] Record Number: [id number] Patient Account Number: [id number] Date of Birth/Sex: Treating RN: 01-28-1966 (56 y.o. [name] Primary Care Arther Heisler: PCP, NO Other Clinician: Referring Jing Howatt: Treating Adeel Guiffre/Extender: Sharalyn Ink in Treatment: 0 Allergies Active Allergies ibuprofen Reaction: anaphylaxis Tylenol Reaction: hives, N/V Allergy Notes Electronic Signature(s) Signed: 01/01/2022 5:26:07 PM By: Deon Pilling RN, BSN Entered By: Deon Pilling on 01/01/2022 10:23:07 -------------------------------------------------------------------------------- Arrival Information Details Patient Name: Date of Service: [facility] [place], [name] 01/01/2022 10:00 [name] Record Number: [id number] Patient Account Number: [id number] Date of Birth/Sex: Treating RN: 23-May-1966 (56 y.o. [name] Primary Care Taquila Leys: PCP, NO Other Clinician: Referring Amiliana Foutz: Treating Taviana Westergren/Extender: Sharalyn Ink in Treatment: 0 Visit Information Patient Arrived: Wheel Chair Arrival Time: 10:13 Accompanied By: self Transfer Assistance: Manual Patient Identification Verified: Yes Secondary Verification Process Completed: Yes Patient Requires Transmission-Based No Precautions: Patient Has Alerts: Yes Patient Alerts: 04/23/21 ABI: L 1.02 R1.03 Electronic Signature(s) Signed: 01/01/2022 5:26:07 PM By: Deon Pilling RN, BSN Entered By: Deon Pilling on 01/01/2022 10:16:47 -------------------------------------------------------------------------------- Clinic Level of Care Assessment Details Patient Name: Date of Service: [name] [place] [facility] [name], [name] [place] 01/01/2022 10:00 [name] Record Number: [id number] Patient Account Number: [id number] Date of Birth/Sex: Treating RN: 1966/08/17 (56 y.o. [name] Primary Care Kambra Beachem: PCP, NO  Other Clinician: Referring Shakyia Bosso: Treating Riddick Nuon/Extender: Sharalyn Ink in Treatment: 0 Clinic Level of Care Assessment Items TOOL 1 Quantity Score X- 1 0 Use when EandM and Procedure is performed on INITIAL visit ASSESSMENTS - Nursing Assessment / Reassessment X- 1 20 General Physical Exam (combine w/ comprehensive assessment (listed just below) when performed on new pt. evals) X- 1 25 Comprehensive Assessment (HX, ROS, Risk Assessments, Wounds Hx, etc.) ASSESSMENTS - Wound and Skin Assessment / Reassessment X- 1 10 Dermatologic / Skin Assessment (not related to wound area) ASSESSMENTS - Ostomy and/or Continence Assessment and Care _0  - 0 Incontinence Assessment and Management _1  - 0 Ostomy Care Assessment and Management (repouching, etc.) PROCESS - Coordination of Care _2  - 0 Simple Patient / Family Education for ongoing care X- 1 20 Complex (extensive) Patient / Family Education for ongoing care X- 1 10 Staff obtains Programmer, systems, Records, T Results / Process Orders est _3  - 0 Staff telephones HHA, Nursing Homes / Clarify orders / etc _4  - 0 Routine Transfer to another Facility (non-emergent condition) _5  - 0 Routine Hospital Admission (non-emergent condition) X- 1 15 New Admissions / Biomedical engineer / Ordering NPWT Apligraf, etc. , _6  - 0 Emergency Hospital Admission (emergent condition) PROCESS - Special Needs _7  - 0 Pediatric / Minor Patient Management _8  - 0 Isolation Patient Management _9  - 0 Hearing / Language / Visual special needs X- 1 15 Assessment of Community assistance (transportation, D/C planning, etc.) _10  - 0 Additional assistance / Altered mentation _11  - 0 Support Surface(s) Assessment (bed, cushion, seat, etc.) INTERVENTIONS - Miscellaneous _12  - 0 External ear exam _13  - 0 Patient Transfer (multiple staff / Civil Service fast streamer / Similar devices) _14  - 0 Simple Staple / Suture removal (25 or less) _15  - 0 Complex Staple / Suture  removal (26 or more) _16  - 0 Hypo/Hyperglycemic Management (do not check if billed separately) _17  - 0 Ankle / Brachial Index (ABI) - do not check if billed separately Has the patient been seen at the hospital within the last three years:  Yes Total Score: 115 Level Of Care: New/Established - Level 3 Electronic Signature(s) Signed: 01/01/2022 5:26:07 PM By: Deon Pilling RN, BSN Signed: 01/01/2022 5:26:07 PM By: Deon Pilling RN, BSN Entered By: Deon Pilling on 01/01/2022 11:21:46 -------------------------------------------------------------------------------- Encounter Discharge Information Details Patient Name: Date of Service: [facility] [name] [name] 01/01/2022 10:00 [place] Number: [id number] Patient Account Number: [id number] Date of Birth/Sex: Treating RN: [birth year] (56 y.o. [name] Primary Care [name] Arvidson: PCP, NO Other Clinician: Referring Bengie Kaucher: Treating [name]/Extender: Sharalyn Ink in Treatment: 0 Encounter Discharge Information Items Post Procedure Vitals Discharge Condition: Stable Temperature (F): 97.5 Ambulatory Status: Wheelchair Pulse (bpm): 88 Discharge Destination: Home Respiratory Rate (breaths/min): 20 Transportation: Private Auto Blood Pressure (mmHg): 193/106 Accompanied By: case worker Schedule Follow-up Appointment: Yes Clinical Summary of Care: Electronic Signature(s) Signed: 01/01/2022 5:26:07 PM By: Deon Pilling RN, BSN Entered By: Deon Pilling on 01/01/2022 11:23:22 -------------------------------------------------------------------------------- Lower Extremity Assessment Details Patient Name: Date of Service: [facility] [place], [place] 01/01/2022 10:00 [place] Record Number: [id number] Patient Account Number: [id number] Date of Birth/Sex: Treating RN: 07-21-1966 (56 y.o. [name] Primary Care Damon Baisch: PCP, NO Other Clinician: Referring Aleene Swanner: Treating Seng Larch/Extender: Sharalyn Ink in Treatment: 0 Edema  Assessment Assessed: [Left: No] [Right: Yes] Edema: [Left: Ye] [Right: s] Calf Left: Right: Point of Measurement: 36 cm From Medial Instep 38 cm Ankle Left: Right: Point of Measurement: 13 cm From Medial Instep 23 cm Knee To Floor Left: Right: From Medial Instep 46 cm Vascular Assessment Pulses: Dorsalis Pedis Palpable: [Right:Yes] Posterior Tibial Palpable: [Right:Yes] Electronic Signature(s) Signed: 01/01/2022 5:26:07 PM By: Deon Pilling RN, BSN Entered By: Deon Pilling on 01/01/2022 10:37:30 -------------------------------------------------------------------------------- Multi-Disciplinary Care Plan Details Patient Name: Date of Service: [name] [place], [place] 01/01/2022 10:00 [place] Record Number: [id number] Patient Account Number: [id number] Date of Birth/Sex: Treating RN: February 18, 1966 (57 y.o. [name] Primary Care Zephaniah Lubrano: PCP, NO Other Clinician: Referring Keylah Darwish: Treating Keidy Thurgood/Extender: Sharalyn Ink in Treatment: 0 Active Inactive Orientation to the Wound Care Program Nursing Diagnoses: Knowledge deficit related to the wound healing [name] program Goals: Patient/caregiver will verbalize understanding of the [place] Program Date Initiated: 01/01/2022 [place] Resolution Date: 02/07/2022 Goal Status: Active Interventions: Provide education on orientation to the wound [name] Notes: Pain, Acute or Chronic Nursing Diagnoses: Pain, acute or chronic: actual or potential Potential alteration in comfort, pain Goals: Patient will verbalize adequate pain control and receive pain control interventions during procedures as needed Date Initiated: 01/01/2022 Target Resolution Date: 02/06/2022 Goal Status: Active Patient/caregiver will verbalize comfort level met Date Initiated: 01/01/2022 Target Resolution Date: 02/07/2022 Goal Status: Active Interventions: Encourage patient to take pain medications as prescribed Provide education on pain  management Treatment Activities: Administer pain control measures as ordered : 01/01/2022 Notes: Wound/Skin Impairment Nursing Diagnoses: Knowledge deficit related to ulceration/compromised skin integrity Goals: Patient/caregiver will verbalize understanding of skin care regimen Date Initiated: 01/01/2022 Target Resolution Date: 02/06/2022 Goal Status: Active Interventions: Assess patient/caregiver ability to obtain necessary supplies Assess patient/caregiver ability to perform ulcer/skin care regimen upon admission and as needed Provide education on ulcer and skin care Treatment Activities: Skin care regimen initiated : 01/01/2022 Topical wound management initiated : 01/01/2022 Notes: Electronic Signature(s) Signed: 01/01/2022 5:26:07 PM By: Deon Pilling RN, BSN Entered By: Deon Pilling on 01/01/2022 11:04:19 -------------------------------------------------------------------------------- Pain Assessment Details Patient Name: Date of Service: [facility] [place], [place] 01/01/2022 10:00 [place] Record Number: [id number] Patient Account Number: [id number] Date of Birth/Sex: Treating  RN: Aug 24, 1966 (56 y.o. [name] Primary Care Azariel Banik: PCP, NO Other Clinician: Referring Macio Kissoon: Treating Khyleigh Furney/Extender: Sharalyn Ink in Treatment: 0 Active Problems Location of Pain Severity and Description of Pain Patient Has Paino Yes Site Locations Pain Location: Generalized Pain, Pain in Ulcers Rate the pain. Current Pain Level: 10 Worst Pain Level: 10 Least Pain Level: 0 Tolerable Pain Level: 9 Character of Pain Describe the Pain: Aching, Heavy, Sharp Pain Management and Medication Current Pain Management: Medication: Yes Cold Application: No Rest: Yes Massage: No Activity: No T.E.N.S.: No Heat Application: No Leg drop or elevation: No Is the Current Pain Management Adequate: Adequate How does your wound impact your activities of daily livingo Sleep: No Bathing:  No Appetite: No Relationship With Others: No Bladder Continence: No Emotions: No Bowel Continence: No Work: No Toileting: No Drive: No Dressing: No Hobbies: No Engineer, maintenance) Signed: 01/01/2022 5:26:07 PM By: Deon Pilling RN, BSN Entered By: Deon Pilling on 01/01/2022 10:38:07 -------------------------------------------------------------------------------- Patient/Caregiver Education Details Patient Name: Date of Service: [name], [place] 2/1/2023andnbsp10:00 [place] Record Number: [id number] Patient Account Number: [id number] Date of Birth/Gender: Treating RN: 1966-04-22 (56 y.o. [name] Primary Care Physician: PCP, NO Other Clinician: Referring Physician: Treating Physician/Extender: Sharalyn Ink in Treatment: 0 Education Assessment Education Provided To: Patient Education Topics Provided Pain: Handouts: A Guide to Pain Control Methods: Explain/Verbal, Printed Responses: Reinforcements needed [place]: o Handouts: Welcome T [facility] [place] o Methods: Explain/Verbal, Printed Responses: Reinforcements needed Electronic Signature(s) Signed: 01/01/2022 5:26:07 PM By: Deon Pilling RN, BSN Entered By: Deon Pilling on 01/01/2022 11:04:42 -------------------------------------------------------------------------------- Wound Assessment Details Patient Name: Date of Service: [facility] [place], [place] 01/01/2022 10:00 [place] Record Number: [id number] Patient Account Number: [id number] Date of Birth/Sex: Treating RN: 01-28-1966 (56 y.o. [name] Primary Care Chay Mazzoni: PCP, NO Other Clinician: Referring Nikki Rusnak: Treating Jillian Warth/Extender: Sharalyn Ink in Treatment: 0 Wound Status Wound Number: 1 Primary Diabetic Wound/Ulcer of the Lower Extremity Etiology: Wound Location: Right, Posterior Lower Leg Secondary Lymphedema Wounding Event: Gradually Appeared Etiology: Date Acquired:  08/31/2021 Wound Open Weeks Of Treatment: 0 Status: Clustered Wound: No Comorbid Chronic Obstructive Pulmonary Disease (COPD), Congestive History: Heart Failure, Hypertension, Type II Diabetes Photos Wound Measurements Length: (cm) 3 Width: (cm) 1.5 Depth: (cm) 0.1 Area: (cm) 3.534 Volume: (cm) 0.353 % Reduction in Area: 0% % Reduction in Volume: 0% Epithelialization: None Tunneling: No Undermining: No Wound Description Classification: Unable to visualize wound bed Wound Margin: Distinct, outline attached Exudate Amount: None Present Foul Odor After Cleansing: No Slough/Fibrino No Wound Bed Granulation Amount: None Present (0%) Exposed Structure Necrotic Amount: Large (67-100%) Fascia Exposed: No Necrotic Quality: Eschar Fat Layer (Subcutaneous Tissue) Exposed: No Tendon Exposed: No Muscle Exposed: No Joint Exposed: No Bone Exposed: No Treatment Notes Wound #1 (Lower Leg) Wound Laterality: Right, Posterior Cleanser Soap and Water Discharge Instruction: May shower and wash wound with dial antibacterial soap and water prior to dressing change. Wound Cleanser Discharge Instruction: Cleanse the wound with wound cleanser prior to applying a clean dressing using gauze sponges, not tissue or cotton balls. Peri-Wound Care Sween Lotion (Moisturizing lotion) Discharge Instruction: Apply moisturizing lotion as directed Topical Primary Dressing Xeroform Occlusive Gauze Dressing, 4x4 in Discharge Instruction: Apply to wound bed as instructed Secondary Dressing Woven Gauze Sponge, Non-Sterile 4x4 in Discharge Instruction: Apply over primary dressing as directed. ABD Pad, 8x10 Discharge Instruction: Apply over primary dressing as directed. Secured With Compression  Wrap Kerlix Roll 4.5x3.1 (in/yd) Discharge Instruction: Apply Kerlix and Coban compression as directed. Coban Self-Adherent Wrap 4x5 (in/yd) Discharge Instruction: Apply over Kerlix as directed. Compression  Stockings Add-Ons Electronic Signature(s) Signed: 01/01/2022 5:21:41 PM By: Baruch Gouty RN, BSN Signed: 01/01/2022 5:26:07 PM By: Deon Pilling RN, BSN Entered By: Deon Pilling on 01/01/2022 11:12:29 -------------------------------------------------------------------------------- Wound Assessment Details Patient Name: Date of Service: [facility] Bishop [place], [place] 01/01/2022 10:00 [place] Record Number: [id number] Patient Account Number: [id number] Date of Birth/Sex: Treating RN: 1966-10-22 (56 y.o. [name] Primary Care Neal Trulson: PCP, NO Other Clinician: Referring Olie Scaffidi: Treating Pearlina Friedly/Extender: Sharalyn Ink in Treatment: 0 Wound Status Wound Number: 2 Primary Diabetic Wound/Ulcer of the Lower Extremity Etiology: Wound Location: Right, Medial Ankle Secondary Venous Leg Ulcer Wounding Event: Gradually Appeared Etiology: Date Acquired: 08/31/2021 Wound Open Weeks Of Treatment: 0 Status: Clustered Wound: No Comorbid Chronic Obstructive Pulmonary Disease (COPD), Congestive History: Heart Failure, Hypertension, Type II Diabetes Photos Wound Measurements Length: (cm) 3.5 Width: (cm) 3.5 Depth: (cm) 0.1 Area: (cm) 9.621 Volume: (cm) 0.962 % Reduction in Area: 0% % Reduction in Volume: 0% Epithelialization: None Tunneling: No Undermining: No Wound Description Classification: Grade 2 Wound Margin: Distinct, outline attached Exudate Amount: Medium Exudate Type: Serosanguineous Exudate Color: red, brown Foul Odor After Cleansing: No Slough/Fibrino Yes Wound Bed Granulation Amount: Small (1-33%) Exposed Structure Granulation Quality: Red, Pink Fascia Exposed: No Necrotic Amount: Large (67-100%) Fat Layer (Subcutaneous Tissue) Exposed: Yes Necrotic Quality: Adherent Slough Tendon Exposed: No Muscle Exposed: No Joint Exposed: No Bone Exposed: No Treatment Notes Wound #2 (Ankle) Wound Laterality: Right, Medial Cleanser Soap and Water Discharge  Instruction: May shower and wash wound with dial antibacterial soap and water prior to dressing change. Wound Cleanser Discharge Instruction: Cleanse the wound with wound cleanser prior to applying a clean dressing using gauze sponges, not tissue or cotton balls. Peri-Wound Care Sween Lotion (Moisturizing lotion) Discharge Instruction: Apply moisturizing lotion as directed Topical Primary Dressing Xeroform Occlusive Gauze Dressing, 4x4 in Discharge Instruction: Apply to wound bed as instructed Secondary Dressing Woven Gauze Sponge, Non-Sterile 4x4 in Discharge Instruction: Apply over primary dressing as directed. ABD Pad, 8x10 Discharge Instruction: Apply over primary dressing as directed. Secured With Compression Wrap Kerlix Roll 4.5x3.1 (in/yd) Discharge Instruction: Apply Kerlix and Coban compression as directed. Coban Self-Adherent Wrap 4x5 (in/yd) Discharge Instruction: Apply over Kerlix as directed. Compression Stockings Add-Ons Electronic Signature(s) Signed: 01/01/2022 5:21:41 PM By: Baruch Gouty RN, BSN Signed: 01/01/2022 5:26:07 PM By: Deon Pilling RN, BSN Entered By: Deon Pilling on 01/01/2022 10:45:23 -------------------------------------------------------------------------------- Wound Assessment Details Patient Name: Date of Service: [facility] [name], [place] 01/01/2022 10:00 [place] Record Number: [id number] Patient Account Number: [id number] Date of Birth/Sex: Treating RN: 1966/02/06 (56 y.o. [name] Primary Care Ewel Lona: PCP, NO Other Clinician: Referring Kadyn Chovan: Treating Bijou Easler/Extender: Sharalyn Ink in Treatment: 0 Wound Status Wound Number: 3 Primary Diabetic Wound/Ulcer of the Lower Extremity Etiology: Wound Location: Right, Medial Foot Wound Open Wounding Event: Gradually Appeared Status: Date Acquired: 08/31/2021 Comorbid Chronic Obstructive Pulmonary Disease (COPD), Congestive Weeks Of Treatment: 0 History: Heart Failure,  Hypertension, Type II Diabetes Clustered Wound: No Photos Wound Measurements Length: (cm) 1.5 Width: (cm) 4.5 Depth: (cm) 0.1 Area: (cm) 5.301 Volume: (cm) 0.53 % Reduction in Area: 0% % Reduction in Volume: 0% Epithelialization: None Tunneling: No Undermining: No Wound Description Classification: Unable to visualize wound bed Wound Margin: Distinct, outline attached Exudate Amount: None Present Foul Odor After Cleansing: No Slough/Fibrino No  Wound Bed Granulation Amount: None Present (0%) Exposed Structure Necrotic Amount: Large (67-100%) Fascia Exposed: No Necrotic Quality: Adherent Slough Fat Layer (Subcutaneous Tissue) Exposed: No Tendon Exposed: No Muscle Exposed: No Joint Exposed: No Bone Exposed: No Treatment Notes Wound #3 (Foot) Wound Laterality: Right, Medial Cleanser Soap and Water Discharge Instruction: May shower and wash wound with dial antibacterial soap and water prior to dressing change. Wound Cleanser Discharge Instruction: Cleanse the wound with wound cleanser prior to applying a clean dressing using gauze sponges, not tissue or cotton balls. Peri-Wound Care Sween Lotion (Moisturizing lotion) Discharge Instruction: Apply moisturizing lotion as directed Topical Primary Dressing Xeroform Occlusive Gauze Dressing, 4x4 in Discharge Instruction: Apply to wound bed as instructed Secondary Dressing Woven Gauze Sponge, Non-Sterile 4x4 in Discharge Instruction: Apply over primary dressing as directed. ABD Pad, 8x10 Discharge Instruction: Apply over primary dressing as directed. Secured With Compression Wrap Kerlix Roll 4.5x3.1 (in/yd) Discharge Instruction: Apply Kerlix and Coban compression as directed. Coban Self-Adherent Wrap 4x5 (in/yd) Discharge Instruction: Apply over Kerlix as directed. Compression Stockings Add-Ons Electronic Signature(s) Signed: 01/01/2022 5:21:41 PM By: Baruch Gouty RN, BSN Signed: 01/01/2022 5:26:07 PM By: Deon Pilling RN, BSN Entered By: Deon Pilling on 01/01/2022 10:43:36 -------------------------------------------------------------------------------- Vitals Details Patient Name: Date of Service: [facility] [place], [place] 01/01/2022 10:00 [place] Record Number: [id number] Patient Account Number: [id number] Date of Birth/Sex: Treating RN: March 24, 1966 (56 y.o. [name] Primary Care Nimrod Wendt: PCP, NO Other Clinician: Referring Faren Florence: Treating Deashia Soule/Extender: Sharalyn Ink in Treatment: 0 Vital Signs Time Taken: 10:18 Temperature (F): 97.5 Height (in): 69 Pulse (bpm): 98 Source: Stated Respiratory Rate (breaths/min): 20 Weight (lbs): 195 Blood Pressure (mmHg): 193/106 Source: Stated Reference Range: 80 - 120 mg / dl Body Mass Index (BMI): 28.8 Notes patient unsure of all medication he takes. Per patient currently not taking any BP medication. Educated patient on elevated BP. Electronic Signature(s) Signed: 01/01/2022 5:26:07 PM By: Deon Pilling RN, BSN Entered By: Deon Pilling on 01/01/2022 10:25:24

## 2022-01-01 NOTE — Progress Notes (Signed)
[name], [name] ([id number]) Visit Report for 01/01/2022 Chief Complaint Document Details Patient Name: Date of Service: [facility] [place], [place] 01/01/2022 10:00 A M Medical Record Number: [id number] Patient Account Number: [id number] Date of Birth/Sex: Treating RN: Sep 29, 1966 (56 y.o. [name] Primary Care Provider: PCP, NO Other Clinician: Referring Provider: Treating Provider/Extender: [name] in Treatment: 0 Information Obtained from: Patient Chief Complaint Bilateral LE Ulcers Electronic Signature(s) Signed: 01/01/2022 11:01:00 AM By: [name] Entered By: [name] on 01/01/2022 11:01:00 -------------------------------------------------------------------------------- Debridement Details Patient Name: Date of Service: [facility] [place] 01/01/2022 10:00 A M Medical Record Number: [id number] Patient Account Number: [id number] Date of Birth/Sex: Treating RN: 1966-01-26 (56 y.o. [name] Primary Care Provider: PCP, NO Other Clinician: Referring Provider: Treating Provider/Extender: [name] in Treatment: 0 Debridement Performed for Assessment: Wound #1 Right,Posterior Lower Leg Performed By: Physician [name] Kelp, PA Debridement Type: Debridement Severity of Tissue Pre Debridement: Limited to breakdown of skin Level of Consciousness (Pre-procedure): Awake and Alert Pre-procedure Verification/Time Out Yes - 11:00 Taken: Start Time: 11:01 Pain Control: Lidocaine 5% topical ointment T Area Debrided (L x W): otal 3 (cm) x 1.5 (cm) = 4.5 (cm) Tissue and other material debrided: Non-Viable, Eschar Level: Non-Viable Tissue Debridement Description: Selective/Open Wound Instrument: Curette Bleeding: None End Time: 11:10 Procedural Pain: 0 Post Procedural Pain: 0 Response to Treatment: Procedure was tolerated well Level of Consciousness (Post- Awake and Alert procedure): Post Debridement Measurements of Total Wound Length:  (cm) 3 Width: (cm) 1.5 Depth: (cm) 0.1 Volume: (cm) 0.353 Character of Wound/Ulcer Post Debridement: Improved Severity of Tissue Post Debridement: Limited to breakdown of skin Post Procedure Diagnosis Same as Pre-procedure Electronic Signature(s) Signed: 01/01/2022 3:56:32 PM By: [name] Signed: 01/01/2022 5:26:07 PM By: [name] Stall RN, BSN Entered By: [name] on 01/01/2022 11:15:54 -------------------------------------------------------------------------------- Debridement Details Patient Name: Date of Service: [name] [place]al[place] 01/01/2022 10:00 A M Medical Record Number: [id number] Patient Account Number: [id number] Date of Birth/Sex: Treating RN: October 12, 1966 (56 y.o. [name] Primary Care Provider: PCP, NO Other Clinician: Referring Provider: Treating Provider/Extender: [name] in Treatment: 0 Debridement Performed for Assessment: Wound #3 Right,Medial Foot Performed By: Physician [name] Kelp, PA Debridement Type: Debridement Severity of Tissue Pre Debridement: Limited to breakdown of skin Level of Consciousness (Pre-procedure): Awake and Alert Pre-procedure Verification/Time Out Yes - 11:00 Taken: Start Time: 11:01 Pain Control: Lidocaine 5% topical ointment T Area Debrided (L x W): otal 1.5 (cm) x 4.5 (cm) = 6.75 (cm) Tissue and other material debrided: Non-Viable, Eschar Level: Non-Viable Tissue Debridement Description: Selective/Open Wound Instrument: Curette Bleeding: None End Time: 11:10 Procedural Pain: 0 Post Procedural Pain: 2 Response to Treatment: Procedure was tolerated well Level of Consciousness (Post- Awake and Alert procedure): Post Debridement Measurements of Total Wound Length: (cm) 1.5 Width: (cm) 4.5 Depth: (cm) 0.1 Volume: (cm) 0.53 Character of Wound/Ulcer Post Debridement: Stable Severity of Tissue Post Debridement: Limited to breakdown of skin Post Procedure Diagnosis Same as  Pre-procedure Electronic Signature(s) Signed: 01/01/2022 3:56:32 PM By: [name] Signed: 01/01/2022 5:26:07 PM By: [name] Stall RN, BSN Entered By: [name] on 01/01/2022 11:16:17 -------------------------------------------------------------------------------- Debridement Details Patient Name: Date of Service: [name] [place]al[place] 01/01/2022 10:00 A M Medical Record Number: [id number] Patient Account Number: [id number] Date of Birth/Sex: Treating RN: [birth year] (56 y.o. [name] Primary Care Provider: PCP, NO Other Clinician: Referring Provider: Treating Provider/Extender: [name]  in Treatment: 0 Debridement Performed for Assessment: Wound #2 Right,Medial Ankle Performed By: Physician [name] Keeler, PA Debridement Type: Chemical/Enzymatic/Mechanical Agent Used: gauze and wound cleanser Severity of Tissue Pre Debridement: Fat layer exposed Level of Consciousness (Pre-procedure): Awake and Alert Pre-procedure Verification/Time Out No Taken: Bleeding: Minimum Response to Treatment: Procedure was tolerated well Level of Consciousness (Post- Awake and Alert procedure): Post Debridement Measurements of Total Wound Length: (cm) 3.5 Width: (cm) 3.5 Depth: (cm) 0.1 Volume: (cm) 0.962 Character of Wound/Ulcer Post Debridement: Stable Severity of Tissue Post Debridement: Fat layer exposed Post Procedure Diagnosis Same as Pre-procedure Electronic Signature(s) Signed: 01/01/2022 3:56:32 PM By: [name] Keeler [name] Signed: 01/01/2022 5:26:07 PM By: [name] Pilling RN, BSN Entered By: [name] on 01/01/2022 11:16:58 -------------------------------------------------------------------------------- HPI Details Patient Name: Date of Service: [name] [place], [place] 01/01/2022 10:00 [place] Record Number: [id number] Patient Account Number: [id number] Date of Birth/Sex: Treating RN: [birth year] (56 y.o. [name] Primary Care Provider: PCP, NO Other  Clinician: Referring Provider: Treating Provider/Extender: [name] in Treatment: 0 History of Present Illness HPI Description: 01/01/2022 upon evaluation today patient is a pleasant gentleman who presents today for evaluation here in the clinic initially. Subsequently he is having a lot of issues here with wounds on his bilateral lower extremities that appear to be for the most part venous in nature. I do not see any signs of infection currently although he has had cellulitis a couple times. He does have several contributing factors to this he has good arterial flow I did review his arterial study which showed an ABI on the right of 1.03 and on the left of 1.02 this was May 2022. Previous that his right ABI was 1.15 and left 1.07. He seems to have good blood flow today as well and I see no changes or shifts here to be perfectly honest. That is good news. Nonetheless he does have some chronic medical conditions that are affecting the situation namely his congestive heart failure I will detail this right below. Patient does have a history of diabetes mellitus type 2 although his A1c is 5.8 on most recent check and seems to be doing okay he has not taken any medication at this point though previously he was on metformin. He also has a history of congestive heart failure with ejection fraction of 25%, COPD, and hypertension. Of note also the patient is currently homeless he is sleeping outside in a sleeping bag. This is definitely not a common complaint as far as his treatment plan is concerned. He did have Medicaid in [place] but recently moved to [place] area. His Medicaid has not flipped over yet although he does have a Education officer, museum with him today who is working on that aspect. Electronic Signature(s) Signed: 01/01/2022 1:52:14 PM By: [name] Keeler [name] Entered By: [name] on 01/01/2022  13:52:13 -------------------------------------------------------------------------------- Physical Exam Details Patient Name: Date of Service: [facility] [place], [place] 01/01/2022 10:00 [place] Record Number: [id number] Patient Account Number: [id number] Date of Birth/Sex: Treating RN: 1966/08/03 (56 y.o. [name] Primary Care Provider: PCP, NO Other Clinician: Referring Provider: Treating Provider/Extender: [name] in Treatment: 0 Constitutional patient is hypertensive.. pulse regular and within target range for patient.Marland Kitchen respirations regular, non-labored and within target range for patient.Marland Kitchen temperature within target range for patient.. Well-nourished and well-hydrated in no acute distress. Eyes conjunctiva clear no eyelid edema noted. pupils equal round and reactive to light and accommodation. Ears,  Nose, Mouth, and Throat no gross abnormality of ear auricles or external auditory canals. normal hearing noted during conversation. mucus membranes moist. Respiratory normal breathing without difficulty. Cardiovascular 2+ dorsalis pedis/posterior tibialis pulses. 1+ pitting edema of the bilateral lower extremities. Musculoskeletal normal gait and posture. no significant deformity or arthritic changes, no loss or range of motion, no clubbing. Psychiatric this patient is able to make decisions and demonstrates good insight into disease process. Alert and Oriented x 3. pleasant and cooperative. Notes Upon inspection patient's wound bed actually showed signs of good granulation and some areas he did have some dry skin and others which really did not have anything underneath it when I clear this away and then 1 region he did have a pretty large eschar that was removed had a roughly dime size wound underneath nothing to date but nonetheless this was the most painful fall that is on the medial aspect of the right leg. Nonetheless in the end I am actually very pleased with where  things stand and I think that with some slight compression I would probably just recommend Curlex and Coban considering his ejection fraction that we should hopefully be able to get this under control. He is in agreement with this plan. I am also going to probably use the Xeroform just due to how dry a lot of these areas have been. Electronic Signature(s) Signed: 01/01/2022 1:53:20 PM By: [name] Keeler [name] Entered By: [name] on 01/01/2022 13:53:20 -------------------------------------------------------------------------------- Physician Orders Details Patient Name: Date of Service: [facility] [place], [place] 01/01/2022 10:00 [place] Record Number: [id number] Patient Account Number: [id number] Date of Birth/Sex: Treating RN: 06/09/1966 (56 y.o. 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 - Wednesday with Margarita Grizzle, [place], RN. Return A Bathing/ Shower/ Hygiene May shower with protection but do not get wound dressing(s) wet. Edema Control - Lymphedema / SCD / Other Elevate legs to the level of the heart or above for 30 minutes daily and/or when sitting, a frequency of: - 3-4 times a day throughout the day. Avoid standing for long periods of time. Exercise regularly Other Edema Control  Orders/Instructions: - apply lotion, kerlix and coban to left foot and leg. Next week plan to provide patient with leg measurements and information for compression stockings. Additional Orders / Instructions Stop/Decrease Smoking Follow Nutritious Diet Wound Treatment Wound #1 - Lower Leg Wound Laterality: Right, Posterior Cleanser: Soap and Water 1 x Per Week/30 Days Discharge Instructions: May shower and wash wound with dial antibacterial soap and water prior to dressing change. Cleanser: Wound Cleanser 1 x Per Week/30 Days Discharge Instructions: Cleanse the wound with wound cleanser prior to applying a clean dressing using gauze sponges, not tissue or cotton balls. Peri-Wound Care: Sween Lotion (Moisturizing lotion) 1 x Per Week/30 Days Discharge Instructions: Apply moisturizing lotion as directed Prim Dressing: Xeroform Occlusive Gauze Dressing, 4x4 in 1 x Per Week/30 Days ary Discharge  Instructions: Apply to wound bed as instructed Secondary Dressing: Woven Gauze Sponge, Non-Sterile 4x4 in 1 x Per Week/30 Days Discharge Instructions: Apply over primary dressing as directed. Secondary Dressing: ABD Pad, 8x10 1 x Per Week/30 Days Discharge Instructions: Apply over primary dressing as directed. Compression Wrap: Kerlix Roll 4.5x3.1 (in/yd) 1 x Per Week/30 Days Discharge Instructions: Apply Kerlix and Coban compression as directed. Compression Wrap: Coban Self-Adherent Wrap 4x5 (in/yd) 1 x Per Week/30 Days Discharge Instructions: Apply over Kerlix as directed. Wound #2 - Ankle Wound Laterality: Right, Medial Cleanser: Soap and Water 1 x Per Week/30 Days Discharge Instructions: May shower and wash wound with dial antibacterial soap and water prior to dressing change. Cleanser: Wound Cleanser 1 x Per Week/30 Days Discharge Instructions: Cleanse the wound with wound cleanser prior to applying a clean dressing using gauze sponges, not tissue or cotton balls. Peri-Wound Care: Sween Lotion  (Moisturizing lotion) 1 x Per Week/30 Days Discharge Instructions: Apply moisturizing lotion as directed Prim Dressing: Xeroform Occlusive Gauze Dressing, 4x4 in 1 x Per Week/30 Days ary Discharge Instructions: Apply to wound bed as instructed Secondary Dressing: Woven Gauze Sponge, Non-Sterile 4x4 in 1 x Per Week/30 Days Discharge Instructions: Apply over primary dressing as directed. Secondary Dressing: ABD Pad, 8x10 1 x Per Week/30 Days Discharge Instructions: Apply over primary dressing as directed. Compression Wrap: Kerlix Roll 4.5x3.1 (in/yd) 1 x Per Week/30 Days Discharge Instructions: Apply Kerlix and Coban compression as directed. Compression Wrap: Coban Self-Adherent Wrap 4x5 (in/yd) 1 x Per Week/30 Days Discharge Instructions: Apply over Kerlix as directed. Wound #3 - Foot Wound Laterality: Right, Medial Cleanser: Soap and Water 1 x Per Week/30 Days Discharge Instructions: May shower and wash wound with dial antibacterial soap and water prior to dressing change. Cleanser: Wound Cleanser 1 x Per Week/30 Days Discharge Instructions: Cleanse the wound with wound cleanser prior to applying a clean dressing using gauze sponges, not tissue or cotton balls. Peri-Wound Care: Sween Lotion (Moisturizing lotion) 1 x Per Week/30 Days Discharge Instructions: Apply moisturizing lotion as directed Prim Dressing: Xeroform Occlusive Gauze Dressing, 4x4 in 1 x Per Week/30 Days ary Discharge Instructions: Apply to wound bed as instructed Secondary Dressing: Woven Gauze Sponge, Non-Sterile 4x4 in 1 x Per Week/30 Days Discharge Instructions: Apply over primary dressing as directed. Secondary Dressing: ABD Pad, 8x10 1 x Per Week/30 Days Discharge Instructions: Apply over primary dressing as directed. Compression Wrap: Kerlix Roll 4.5x3.1 (in/yd) 1 x Per Week/30 Days Discharge Instructions: Apply Kerlix and Coban compression as directed. Compression Wrap: Coban Self-Adherent Wrap 4x5 (in/yd) 1 x Per  Week/30 Days Discharge Instructions: Apply over Kerlix as directed. Consults Pain Clinic - Referral to pain management clinic for chronic pain. - (ICD10 E11.621 - Type 2 diabetes mellitus with foot ulcer) Patient Medications llergies: ibuprofen, Tylenol A Notifications Medication Indication Start End lidocaine DOSE topical 5 % gel - gel topical applied only in clinic Electronic Signature(s) Signed: 01/01/2022 3:56:32 PM By: [name] Keeler [name] Signed: 01/01/2022 5:26:07 PM By: [name] Pilling RN, BSN Entered By: [name] on 01/01/2022 11:20:42 Prescription 01/01/2022 -------------------------------------------------------------------------------- Launa Grill PA Patient Name: Provider: September 03, 1966 [id number] Date of Birth: NPI#: Jerilynn Mages [id number] Sex: DEA #: [id number] Phone #: License #: [place] Patient Address: [address], [place] [address] [place], [place] [address] [phone] Allergies ibuprofen; Tylenol Provider's Orders Pain Clinic - ICD10: E11.621 - Referral to pain management clinic for chronic pain.  Hand Signature: Date(s): Prescription 01/01/2022 Launa Grill PA Patient Name: Provider: Feb 01, 1966 [id number] Date of Birth: NPI#: Jerilynn Mages [id number] Sex: DEA #: [id number] Phone #: License #: [place] Patient Address: [address], [place] [address] [place], [place] [address] [phone] Allergies ibuprofen; Tylenol Medication Medication: Route: Strength: Form: lidocaine topical 5% gel Class: TOPICAL LOCAL ANESTHETICS Dose: Frequency / Time: Indication: gel topical applied only in clinic Number of Refills: Number of Units: 0 Generic Substitution: Start Date: End Date: Administered at Facility: Substitution Permitted Yes Time Administered: Time  Discontinued: Note to Pharmacy: Hand Signature: Date(s): Electronic Signature(s) Signed: 01/01/2022 3:56:32 PM By: [name] Keeler [name] Signed: 01/01/2022 5:26:07 PM By: [name] Pilling RN, BSN Entered By: [name] on 01/01/2022 11:20:43 -------------------------------------------------------------------------------- Problem List Details Patient Name: Date of Service: [name] [place], [place] 01/01/2022 10:00 [place] Record Number: [id number] Patient Account Number: [id number] Date of Birth/Sex: Treating RN: June 05, 1966 (56 y.o. [name] Primary Care Provider: PCP, NO Other Clinician: Referring Provider: Treating Provider/Extender: [name] in Treatment: 0 Active Problems ICD-10 Encounter Code Description Active Date MDM Diagnosis E11.621 Type 2 diabetes mellitus with foot ulcer 01/01/2022 No Yes L97.512 Non-pressure chronic ulcer of other part of right foot with fat layer exposed 01/01/2022 No Yes L97.312 Non-pressure chronic ulcer of right ankle with fat layer exposed 01/01/2022 No Yes L97.822 Non-pressure chronic ulcer of other part of left lower leg with fat layer exposed2/12/2021 No Yes I50.42 Chronic combined systolic (congestive) and diastolic (congestive) heart failure 01/01/2022 No Yes J44.9 Chronic obstructive pulmonary disease, unspecified 01/01/2022 No Yes I10 Essential (primary) hypertension 01/01/2022 No Yes Inactive Problems Resolved Problems Electronic Signature(s) Signed: 01/01/2022 11:00:26 AM By: [name] Keeler [name] Entered By: [name] on 01/01/2022 11:00:26 -------------------------------------------------------------------------------- Progress Note Details Patient Name: Date of Service: [facility] [place], [place] 01/01/2022 10:00 [place] Record Number: [id number] Patient Account Number: [id number] Date of Birth/Sex: Treating RN: October 19, 1966 (56 y.o. [name] Primary Care Provider: PCP, NO Other Clinician: Referring Provider: Treating  Provider/Extender: [name] in Treatment: 0 Subjective Chief Complaint Information obtained from Patient Bilateral LE Ulcers History of Present Illness (HPI) 01/01/2022 upon evaluation today patient is a pleasant gentleman who presents today for evaluation here in the clinic initially. Subsequently he is having a lot of issues here with wounds on his bilateral lower extremities that appear to be for the most part venous in nature. I do not see any signs of infection currently although he has had cellulitis a couple times. He does have several contributing factors to this he has good arterial flow I did review his arterial study which showed an ABI on the right of 1.03 and on the left of 1.02 this was May 2022. Previous that his right ABI was 1.15 and left 1.07. He seems to have good blood flow today as well and I see no changes or shifts here to be perfectly honest. That is good news. Nonetheless he does have some chronic medical conditions that are affecting the situation namely his congestive heart failure I will detail this right below. Patient does have a history of diabetes mellitus type 2 although his A1c is 5.8 on most recent check and seems to be doing okay he has not taken any medication at this point though previously he was on metformin. He also has a history of congestive heart failure with ejection fraction  of 25%, COPD, and hypertension. Of note also the patient is currently homeless he is sleeping outside in a sleeping bag. This is definitely not a common complaint as far as his treatment plan is concerned. He did have Medicaid in [place] but recently moved to [place] area. His Medicaid has not flipped over yet although he does have a Child psychotherapist with him today who is working on that aspect. Patient History Information obtained from Patient, Chart. Allergies ibuprofen (Reaction: anaphylaxis), Tylenol (Reaction: hives, N/V) Social History Current every  day smoker, Marital Status - Single, Alcohol Use - Moderate, Drug Use - Current History - cocaine-last wee; marijuana last night, Caffeine Use - Daily - soda. Medical History Eyes Denies history of Cataracts, Glaucoma, Optic Neuritis Ear/Nose/Mouth/Throat Denies history of Chronic sinus problems/congestion, Middle ear problems Hematologic/Lymphatic Denies history of Anemia, Hemophilia, Human Immunodeficiency Virus, Lymphedema, Sickle Cell Disease Respiratory Patient has history of Chronic Obstructive Pulmonary Disease (COPD) Denies history of Aspiration, Asthma, Pneumothorax, Sleep Apnea, Tuberculosis Cardiovascular Patient has history of Congestive Heart Failure, Hypertension Denies history of Angina, Arrhythmia, Coronary Artery Disease, Deep Vein Thrombosis, Hypotension, Myocardial Infarction, Peripheral Arterial Disease, Peripheral Venous Disease, Phlebitis, Vasculitis Gastrointestinal Denies history of Cirrhosis , Colitis, Crohnoos, Hepatitis A, Hepatitis B, Hepatitis C Endocrine Patient has history of Type II Diabetes Denies history of Type I Diabetes Genitourinary Denies history of End Stage Renal Disease Immunological Denies history of Lupus Erythematosus, Raynaudoos, Scleroderma Integumentary (Skin) Denies history of History of Burn Musculoskeletal Denies history of Gout, Rheumatoid Arthritis, Osteoarthritis, Osteomyelitis Neurologic Denies history of Dementia, Neuropathy, Quadriplegia, Paraplegia, Seizure Disorder Oncologic Denies history of Received Chemotherapy, Received Radiation Psychiatric Denies history of Anorexia/bulimia, Confinement Anxiety Blood sugar is not tested. Hospitalization/Surgery History - bilateral great toe amputations 01/02/2021. - Cellulitis RLE 11/21/2021. Medical A Surgical History Notes nd Psychiatric Bipolar type I Schizoaffective disorder Review of Systems (ROS) Constitutional Symptoms (General Health) Denies complaints or symptoms of  Fatigue, Fever, Chills, Marked Weight Change. Eyes Complains or has symptoms of Vision Changes - near sided per patient.. Denies complaints or symptoms of Dry Eyes, Glasses / Contacts. Ear/Nose/Mouth/Throat Denies complaints or symptoms of Chronic sinus problems or rhinitis. Respiratory Denies complaints or symptoms of Chronic or frequent coughs, Shortness of Breath. Cardiovascular Denies complaints or symptoms of Chest pain. Gastrointestinal Denies complaints or symptoms of Frequent diarrhea, Nausea, Vomiting. Endocrine Denies complaints or symptoms of Heat/cold intolerance. Genitourinary Denies complaints or symptoms of Frequent urination. Integumentary (Skin) Complains or has symptoms of Wounds - right ankle and calf. Musculoskeletal Denies complaints or symptoms of Muscle Pain, Muscle Weakness. Neurologic Denies complaints or symptoms of Numbness/parasthesias. Psychiatric Denies complaints or symptoms of Claustrophobia, Suicidal. Objective Constitutional patient is hypertensive.. pulse regular and within target range for patient.Marland Kitchen respirations regular, non-labored and within target range for patient.Marland Kitchen temperature within target range for patient.. Well-nourished and well-hydrated in no acute distress. Vitals Time Taken: 10:18 AM, Height: 69 in, Source: Stated, Weight: 195 lbs, Source: Stated, BMI: 28.8, Temperature: 97.5 F, Pulse: 98 bpm, Respiratory Rate: 20 breaths/min, Blood Pressure: 193/106 mmHg. General Notes: patient unsure of all medication he takes. Per patient currently not taking any BP medication. Educated patient on elevated BP. Eyes conjunctiva clear no eyelid edema noted. pupils equal round and reactive to light and accommodation. Ears, Nose, Mouth, and Throat no gross abnormality of ear auricles or external auditory canals. normal hearing noted during conversation. mucus membranes moist. Respiratory normal breathing without difficulty. Cardiovascular 2+  dorsalis pedis/posterior tibialis pulses. 1+ pitting edema of  the bilateral lower extremities. Musculoskeletal normal gait and posture. no significant deformity or arthritic changes, no loss or range of motion, no clubbing. Psychiatric this patient is able to make decisions and demonstrates good insight into disease process. Alert and Oriented x 3. pleasant and cooperative. General Notes: Upon inspection patient's wound bed actually showed signs of good granulation and some areas he did have some dry skin and others which really did not have anything underneath it when I clear this away and then 1 region he did have a pretty large eschar that was removed had a roughly dime size wound underneath nothing to date but nonetheless this was the most painful fall that is on the medial aspect of the right leg. Nonetheless in the end I am actually very pleased with where things stand and I think that with some slight compression I would probably just recommend Curlex and Coban considering his ejection fraction that we should hopefully be able to get this under control. He is in agreement with this plan. I am also going to probably use the Xeroform just due to how dry a lot of these areas have been. Integumentary (Hair, Skin) Wound #1 status is Open. Original cause of wound was Gradually Appeared. The date acquired was: 08/31/2021. The wound is located on the Right,Posterior Lower Leg. The wound measures 3cm length x 1.5cm width x 0.1cm depth; 3.534cm^2 area and 0.353cm^3 volume. There is no tunneling or undermining noted. There is a none present amount of drainage noted. The wound margin is distinct with the outline attached to the wound base. There is no granulation within the wound bed. There is a large (67-100%) amount of necrotic tissue within the wound bed including Eschar. Wound #2 status is Open. Original cause of wound was Gradually Appeared. The date acquired was: 08/31/2021. The wound is located on  the Right,Medial Ankle. The wound measures 3.5cm length x 3.5cm width x 0.1cm depth; 9.621cm^2 area and 0.962cm^3 volume. There is Fat Layer (Subcutaneous Tissue) exposed. There is no tunneling or undermining noted. There is a medium amount of serosanguineous drainage noted. The wound margin is distinct with the outline attached to the wound base. There is small (1-33%) red, pink granulation within the wound bed. There is a large (67-100%) amount of necrotic tissue within the wound bed including Adherent Slough. Wound #3 status is Open. Original cause of wound was Gradually Appeared. The date acquired was: 08/31/2021. The wound is located on the Right,Medial Foot. The wound measures 1.5cm length x 4.5cm width x 0.1cm depth; 5.301cm^2 area and 0.53cm^3 volume. There is no tunneling or undermining noted. There is a none present amount of drainage noted. The wound margin is distinct with the outline attached to the wound base. There is no granulation within the wound bed. There is a large (67-100%) amount of necrotic tissue within the wound bed including Adherent Slough. 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 There was a Selective/Open Wound Non-Viable Tissue Debridement with  a total area of 4.5 sq cm performed by [name] Keeler, PA. With the following instrument(s):  Curette to remove Non-Viable tissue/material. Material removed includes Eschar after achieving pain control using Lidocaine 5% topical ointment. A time out was conducted at 11:00, prior to the start of the procedure. There was no bleeding. The procedure was tolerated well with a pain level of 0 throughout and a pain level of 0 following the procedure. Post Debridement Measurements: 3cm length x 1.5cm width x 0.1cm depth; 0.353cm^3 volume. Character of Wound/Ulcer Post Debridement is improved. Severity of Tissue Post Debridement is: Limited to breakdown of skin. Post procedure Diagnosis Wound #1: Same as Pre-Procedure Wound #2 Pre-procedure diagnosis of Wound #2 is a Diabetic Wound/Ulcer of the Lower Extremity located on the Right,Medial Ankle .Severity of Tissue Pre Debridement is: Fat layer exposed. There was a Chemical/Enzymatic/Mechanical debridement performed by [name] Keeler, PA.. Other agent used was gauze and wound cleanser. A Minimum amount of bleeding was controlled with N/A. The procedure was tolerated well. Post Debridement Measurements: 3.5cm length x 3.5cm width x 0.1cm depth; 0.962cm^3 volume. Character of Wound/Ulcer Post Debridement is stable. Severity of Tissue Post Debridement is: Fat layer exposed. Post procedure Diagnosis Wound #2: Same as Pre-Procedure Wound #3 Pre-procedure diagnosis of Wound #3 is a Diabetic Wound/Ulcer of the Lower Extremity located on the Right,Medial Foot .Severity of Tissue Pre Debridement is: Limited to breakdown of skin. There was a Selective/Open Wound Non-Viable Tissue Debridement with a total area of 6.75 sq cm performed by [name] Keeler, PA. With the following instrument(s): Curette to remove Non-Viable tissue/material. Material removed includes Eschar after achieving pain control using Lidocaine 5% topical ointment. A time out was conducted at 11:00, prior to the start of the procedure. There was no bleeding. The procedure was tolerated  well with a pain level of 0 throughout and a pain level of 2 following the procedure. Post Debridement Measurements: 1.5cm length x 4.5cm width x 0.1cm depth; 0.53cm^3 volume. Character of Wound/Ulcer Post Debridement is stable. Severity of Tissue Post Debridement is: Limited to breakdown of skin. Post procedure Diagnosis Wound #3: Same as Pre-Procedure Plan Follow-up Appointments: Return Appointment in 1 week. - Wednesday with Margarita Grizzle, [place], RN. Bathing/ Shower/ Hygiene: May shower with protection but do not get wound dressing(s) wet. Edema Control - Lymphedema / SCD / Other: Elevate legs to the level of the heart or above for 30 minutes daily and/or when sitting, a frequency of: - 3-4 times a day throughout the day. Avoid standing for long periods of time. Exercise regularly Other Edema Control Orders/Instructions: - apply lotion, kerlix and coban to left foot and leg. Next week plan to provide patient with leg measurements and information for compression stockings. Additional Orders / Instructions: Stop/Decrease Smoking Follow Nutritious Diet Consults ordered were: Pain Clinic - Referral to pain management clinic for chronic pain. The following medication(s) was prescribed: lidocaine topical 5 % gel gel topical applied only in clinic was prescribed at facility WOUND #1: - Lower Leg Wound Laterality: Right, Posterior Cleanser: Soap and Water 1 x Per Week/30 Days Discharge Instructions: May shower and wash wound with dial antibacterial soap and water prior to dressing change. Cleanser: Wound Cleanser 1 x Per Week/30 Days Discharge Instructions: Cleanse the wound with wound cleanser prior to applying a clean dressing using gauze sponges, not tissue or cotton balls. Peri-Wound Care: Sween Lotion (Moisturizing lotion) 1 x Per Week/30 Days Discharge Instructions: Apply moisturizing lotion as directed Prim Dressing: Xeroform  Occlusive Gauze Dressing, 4x4 in 1 x Per Week/30  Days ary Discharge Instructions: Apply to wound bed as instructed Secondary Dressing: Woven Gauze Sponge, Non-Sterile 4x4 in 1 x Per Week/30 Days Discharge Instructions: Apply over primary dressing as directed. Secondary Dressing: ABD Pad, 8x10 1 x Per Week/30 Days Discharge Instructions: Apply over primary dressing as directed. Com pression Wrap: Kerlix Roll 4.5x3.1 (in/yd) 1 x Per Week/30 Days Discharge Instructions: Apply Kerlix and Coban compression as directed. Com pression Wrap: Coban Self-Adherent Wrap 4x5 (in/yd) 1 x Per Week/30 Days Discharge Instructions: Apply over Kerlix as directed. WOUND #2: - Ankle Wound Laterality: Right, Medial Cleanser: Soap and Water 1 x Per Week/30 Days Discharge Instructions: May shower and wash wound with dial antibacterial soap and water prior to dressing change. Cleanser: Wound Cleanser 1 x Per Week/30 Days Discharge Instructions: Cleanse the wound with wound cleanser prior to applying a clean dressing using gauze sponges, not tissue or cotton balls. Peri-Wound Care: Sween Lotion (Moisturizing lotion) 1 x Per Week/30 Days Discharge Instructions: Apply moisturizing lotion as directed Prim Dressing: Xeroform Occlusive Gauze Dressing, 4x4 in 1 x Per Week/30 Days ary Discharge Instructions: Apply to wound bed as instructed Secondary Dressing: Woven Gauze Sponge, Non-Sterile 4x4 in 1 x Per Week/30 Days Discharge Instructions: Apply over primary dressing as directed. Secondary Dressing: ABD Pad, 8x10 1 x Per Week/30 Days Discharge Instructions: Apply over primary dressing as directed. Com pression Wrap: Kerlix Roll 4.5x3.1 (in/yd) 1 x Per Week/30 Days Discharge Instructions: Apply Kerlix and Coban compression as directed. Com pression Wrap: Coban Self-Adherent Wrap 4x5 (in/yd) 1 x Per Week/30 Days Discharge Instructions: Apply over Kerlix as directed. WOUND #3: - Foot Wound Laterality: Right, Medial Cleanser: Soap and Water 1 x Per Week/30  Days Discharge Instructions: May shower and wash wound with dial antibacterial soap and water prior to dressing change. Cleanser: Wound Cleanser 1 x Per Week/30 Days Discharge Instructions: Cleanse the wound with wound cleanser prior to applying a clean dressing using gauze sponges, not tissue or cotton balls. Peri-Wound Care: Sween Lotion (Moisturizing lotion) 1 x Per Week/30 Days Discharge Instructions: Apply moisturizing lotion as directed Prim Dressing: Xeroform Occlusive Gauze Dressing, 4x4 in 1 x Per Week/30 Days ary Discharge Instructions: Apply to wound bed as instructed Secondary Dressing: Woven Gauze Sponge, Non-Sterile 4x4 in 1 x Per Week/30 Days Discharge Instructions: Apply over primary dressing as directed. Secondary Dressing: ABD Pad, 8x10 1 x Per Week/30 Days Discharge Instructions: Apply over primary dressing as directed. Com pression Wrap: Kerlix Roll 4.5x3.1 (in/yd) 1 x Per Week/30 Days Discharge Instructions: Apply Kerlix and Coban compression as directed. Com pression Wrap: Coban Self-Adherent Wrap 4x5 (in/yd) 1 x Per Week/30 Days Discharge Instructions: Apply over Kerlix as directed. 1. Would recommend currently that we go ahead and initiate treatment with Xeroform gauze to the wounds since most of these are very dry I want to see how this does over the next week. Depending on the results we will make any adjustments in therapy as we need to. 2. I am also going to suggest a Curlex and Coban wrap be used. I do not want to do anything too compressive simply due to the fact that his ejection fraction of 25% is not good but nonetheless I also want to get some of this edema of the leg to help the wounds to heal. 3. We also discussed compression stockings we will remeasure next week for this and subsequently give him the information for elastic therapy as well.  4. I did discuss with the patient that he is getting need to make sure to be here for his appointments. Again I just  want to make sure that we have the best chance we can have to get this healed. We will see patient back for reevaluation in 1 week here in the clinic. If anything worsens or changes patient will contact our office for additional recommendations. Electronic Signature(s) Signed: 01/01/2022 1:54:59 PM By: [name] Keeler [name] Entered By: [name] on 01/01/2022 13:54:59 -------------------------------------------------------------------------------- HxROS Details Patient Name: Date of Service: [facility] [place], [place] 01/01/2022 10:00 [place] Record Number: [id number] Patient Account Number: [id number] Date of Birth/Sex: Treating RN: 11/17/1966 (56 y.o. [name] Primary Care Provider: PCP, NO Other Clinician: Referring Provider: Treating Provider/Extender: [name] in Treatment: 0 Information Obtained From Patient Chart Constitutional Symptoms (General Health) Complaints and Symptoms: Negative for: Fatigue; Fever; Chills; Marked Weight Change Eyes Complaints and Symptoms: Positive for: Vision Changes - near sided per patient. Negative for: Dry Eyes; Glasses / Contacts Medical History: Negative for: Cataracts; Glaucoma; Optic Neuritis Ear/Nose/Mouth/Throat Complaints and Symptoms: Negative for: Chronic sinus problems or rhinitis Medical History: Negative for: Chronic sinus problems/congestion; Middle ear problems Respiratory Complaints and Symptoms: Negative for: Chronic or frequent coughs; Shortness of Breath Medical History: Positive for: Chronic Obstructive Pulmonary Disease (COPD) Negative for: Aspiration; Asthma; Pneumothorax; Sleep Apnea; Tuberculosis Cardiovascular Complaints and Symptoms: Negative for: Chest pain Medical History: Positive for: Congestive Heart Failure; Hypertension Negative for: Angina; Arrhythmia; Coronary Artery Disease; Deep Vein Thrombosis; Hypotension; Myocardial Infarction; Peripheral Arterial Disease; Peripheral Venous  Disease; Phlebitis; Vasculitis Gastrointestinal Complaints and Symptoms: Negative for: Frequent diarrhea; Nausea; Vomiting Medical History: Negative for: Cirrhosis ; Colitis; Crohns; Hepatitis A; Hepatitis B; Hepatitis C Endocrine Complaints and Symptoms: Negative for: Heat/cold intolerance Medical History: Positive for: Type II Diabetes Negative for: Type I Diabetes Time with diabetes: unsure Blood sugar tested every day: No Genitourinary Complaints and Symptoms: Negative for: Frequent urination Medical History: Negative for: End Stage Renal Disease Integumentary (Skin) Complaints and Symptoms: Positive for: Wounds - right ankle and calf Medical History: Negative for: History of Burn Musculoskeletal Complaints and Symptoms: Negative for: Muscle Pain; Muscle Weakness Medical History: Negative for: Gout; Rheumatoid Arthritis; Osteoarthritis; Osteomyelitis Neurologic Complaints and Symptoms: Negative for: Numbness/parasthesias Medical History: Negative for: Dementia; Neuropathy; Quadriplegia; Paraplegia; Seizure Disorder Psychiatric Complaints and Symptoms: Negative for: Claustrophobia; Suicidal Medical History: Negative for: Anorexia/bulimia; Confinement Anxiety Past Medical History Notes: Bipolar type I Schizoaffective disorder Hematologic/Lymphatic Medical History: Negative for: Anemia; Hemophilia; Human Immunodeficiency Virus; Lymphedema; Sickle Cell Disease Immunological Medical History: Negative for: Lupus Erythematosus; Raynauds; Scleroderma Oncologic Medical History: Negative for: Received Chemotherapy; Received Radiation Immunizations Implantable Devices No devices added Hospitalization / Surgery History Type of Hospitalization/Surgery bilateral great toe amputations 01/02/2021 Cellulitis RLE 11/21/2021 Family and Social History Current every day smoker; Marital Status - Single; Alcohol Use: Moderate; Drug Use: Current History - cocaine-last wee;  marijuana last night; Caffeine Use: Daily - soda; Financial Concerns: No; Food, Clothing or Shelter Needs: Yes - per patient homeless; sleeps outside; Support System Lacking: No; Transportation Concerns: No Electronic Signature(s) Signed: 01/01/2022 3:56:32 PM By: [name] Keeler [name] Signed: 01/01/2022 5:26:07 PM By: [name] Pilling RN, BSN Entered By: [name] on 01/01/2022 11:03:31 -------------------------------------------------------------------------------- SuperBill Details Patient Name: Date of Service: [facility] [place], [place] 01/01/2022 Medical Record Number: [id number] Patient Account Number: [id number] Date of Birth/Sex: Treating RN: [birth year] (56 y.o. [name] Primary Care Provider: PCP, NO Other Clinician:  Referring Provider: Treating Provider/Extender: [name] in Treatment: 0 Diagnosis Coding ICD-10 Codes Code Description E11.621 Type 2 diabetes mellitus with foot ulcer L97.512 Non-pressure chronic ulcer of other part of right foot with fat layer exposed L97.312 Non-pressure chronic ulcer of right ankle with fat layer exposed L97.822 Non-pressure chronic ulcer of other part of left lower leg with fat layer exposed I50.42 Chronic combined systolic (congestive) and diastolic (congestive) heart failure J44.9 Chronic obstructive pulmonary disease, unspecified I10 Essential (primary) hypertension Facility Procedures CPT4 Code: [id number] Description: 99213 - WOUND CARE VISIT-LEV 3 EST PT Modifier: Quantity: 1 CPT4 Code: [id number] Description: [id number] - DEBRIDE WOUND 1ST 20 SQ CM OR < ICD-10 Diagnosis Description L97.312 Non-pressure chronic ulcer of right ankle with fat layer exposed Modifier: Quantity: 1 Physician Procedures : CPT4 Code Description Modifier [id number] - WC PHYS LEVEL 4 - NEW PT 25 ICD-10 Diagnosis Description E11.621 Type 2 diabetes mellitus with foot ulcer L97.512 Non-pressure chronic ulcer of other part of right foot with fat layer  exposed L97.312  Non-pressure chronic ulcer of right ankle with fat layer exposed L97.822 Non-pressure chronic ulcer of other part of left lower leg with fat layer exposed Quantity: 1 : [id number] - WC PHYS DEBR WO ANESTH 20 SQ CM ICD-10 Diagnosis Description L97.312 Non-pressure chronic ulcer of right ankle with fat layer exposed Quantity: 1 Electronic Signature(s) Signed: 01/01/2022 1:55:26 PM By: [name] Keeler [name] Entered By: [name] on 01/01/2022 13:55:25

## 2022-01-06 ENCOUNTER — Emergency Department (HOSPITAL_COMMUNITY)
Admission: EM | Admit: 2022-01-06 | Discharge: 2022-01-06 | Disposition: A | Discharge status: Home / Self Care | Payer: Medicaid Other | Attending: Emergency Medicine | Admitting: Emergency Medicine

## 2022-01-06 ENCOUNTER — Emergency Department (HOSPITAL_COMMUNITY): Payer: Medicaid Other

## 2022-01-06 ENCOUNTER — Encounter (HOSPITAL_COMMUNITY): Payer: Self-pay

## 2022-01-06 DIAGNOSIS — S91001A Unspecified open wound, right ankle, initial encounter: Secondary | ICD-10-CM | POA: Diagnosis not present

## 2022-01-06 DIAGNOSIS — R2243 Localized swelling, mass and lump, lower limb, bilateral: Secondary | ICD-10-CM | POA: Insufficient documentation

## 2022-01-06 DIAGNOSIS — L97319 Non-pressure chronic ulcer of right ankle with unspecified severity: Secondary | ICD-10-CM | POA: Insufficient documentation

## 2022-01-06 DIAGNOSIS — X58XXXA Exposure to other specified factors, initial encounter: Secondary | ICD-10-CM | POA: Diagnosis not present

## 2022-01-06 DIAGNOSIS — L97309 Non-pressure chronic ulcer of unspecified ankle with unspecified severity: Secondary | ICD-10-CM

## 2022-01-06 DIAGNOSIS — R000 Tachycardia, unspecified: Secondary | ICD-10-CM | POA: Insufficient documentation

## 2022-01-06 DIAGNOSIS — L97329 Non-pressure chronic ulcer of left ankle with unspecified severity: Secondary | ICD-10-CM | POA: Diagnosis not present

## 2022-01-06 DIAGNOSIS — Z79899 Other long term (current) drug therapy: Secondary | ICD-10-CM | POA: Insufficient documentation

## 2022-01-06 DIAGNOSIS — I10 Essential (primary) hypertension: Secondary | ICD-10-CM | POA: Insufficient documentation

## 2022-01-06 DIAGNOSIS — S91002A Unspecified open wound, left ankle, initial encounter: Secondary | ICD-10-CM | POA: Insufficient documentation

## 2022-01-06 DIAGNOSIS — Z91010 Allergy to peanuts: Secondary | ICD-10-CM | POA: Insufficient documentation

## 2022-01-06 IMAGING — DX DG ANKLE COMPLETE 3+V*R*
3 series · 3 of 3 positions shown · non-contrast
Comparison: None.

CLINICAL DATA: Open wound in the skin, pain

EXAM:
RIGHT ANKLE - COMPLETE 3+ VIEW

[ankle ap]
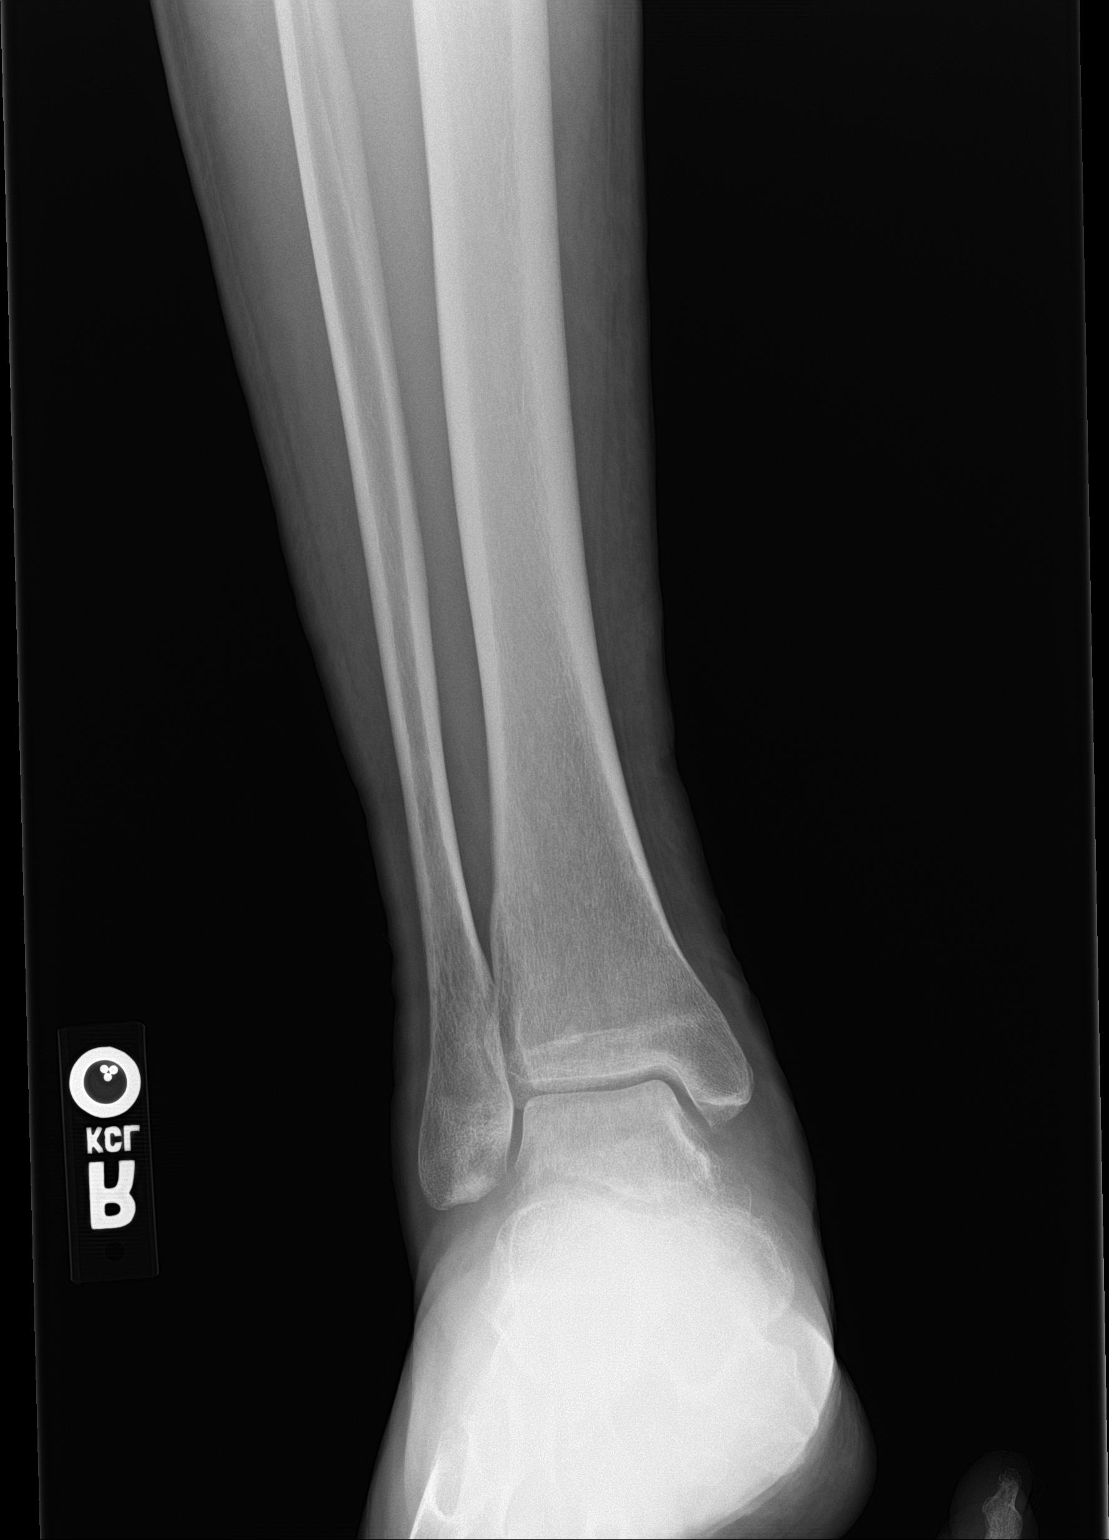

[ankle obl]
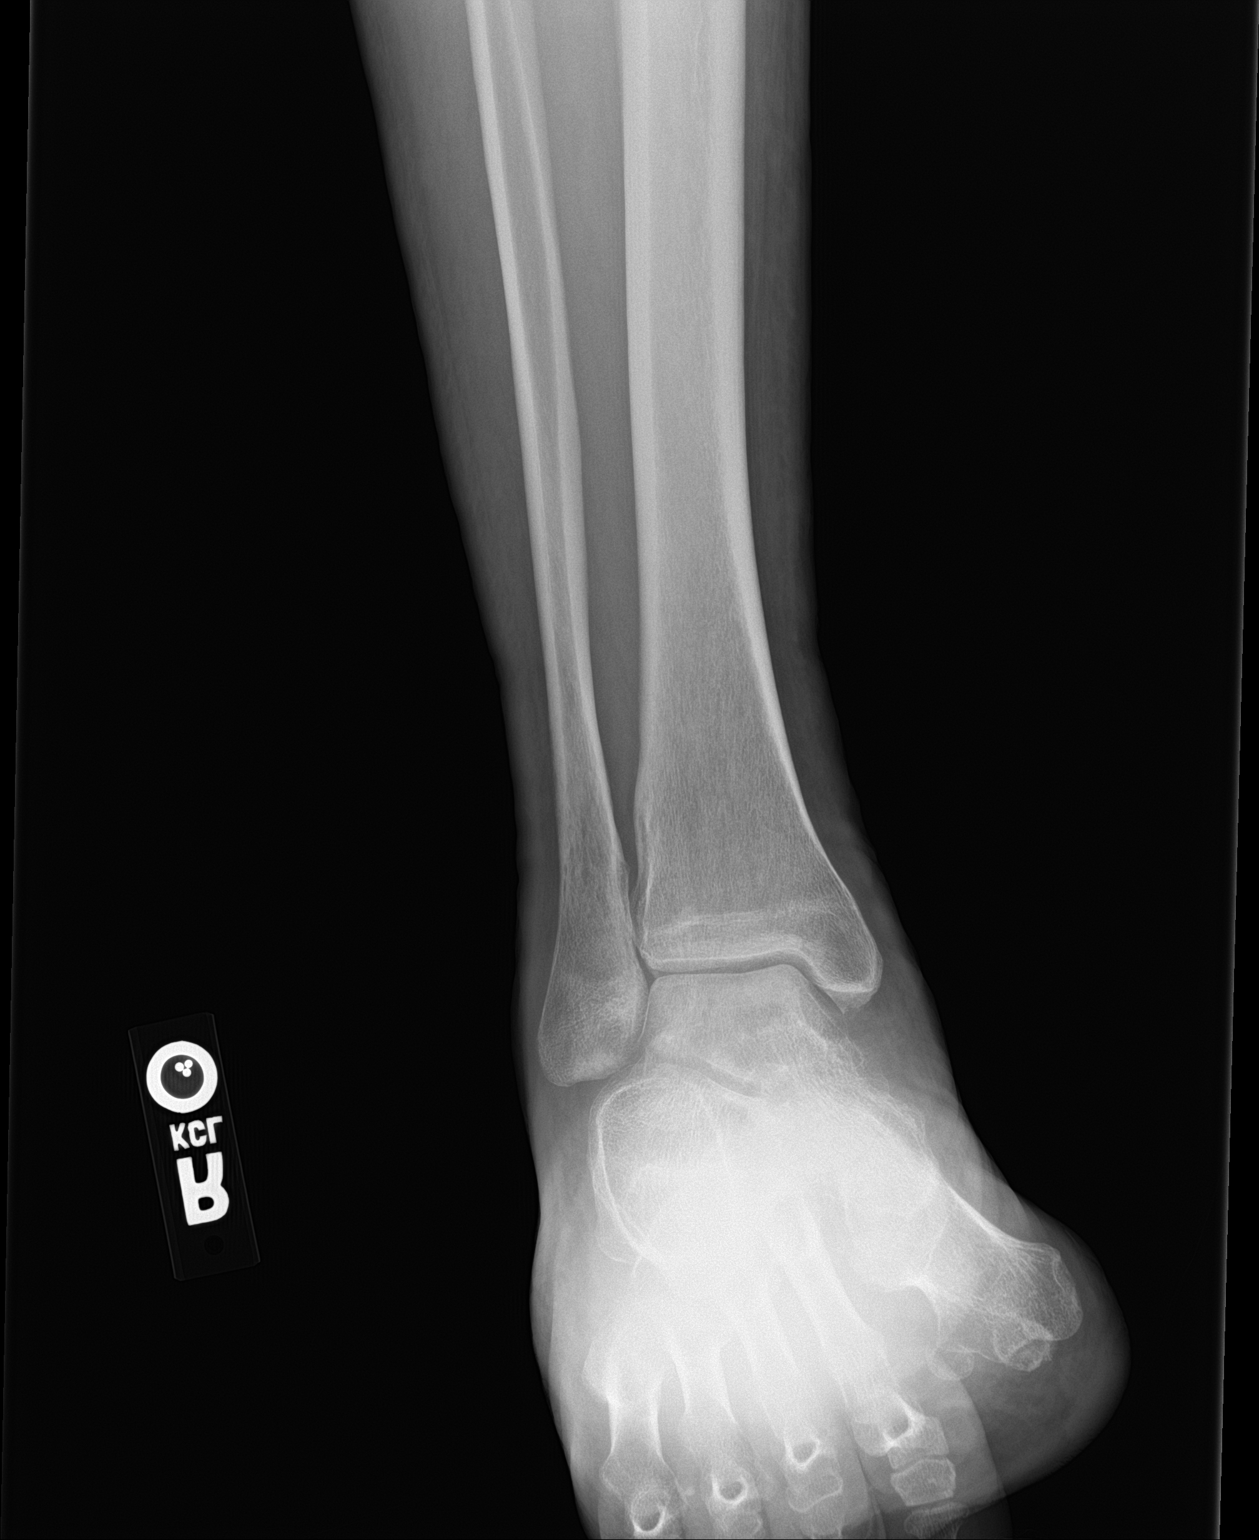

[ankle lat]
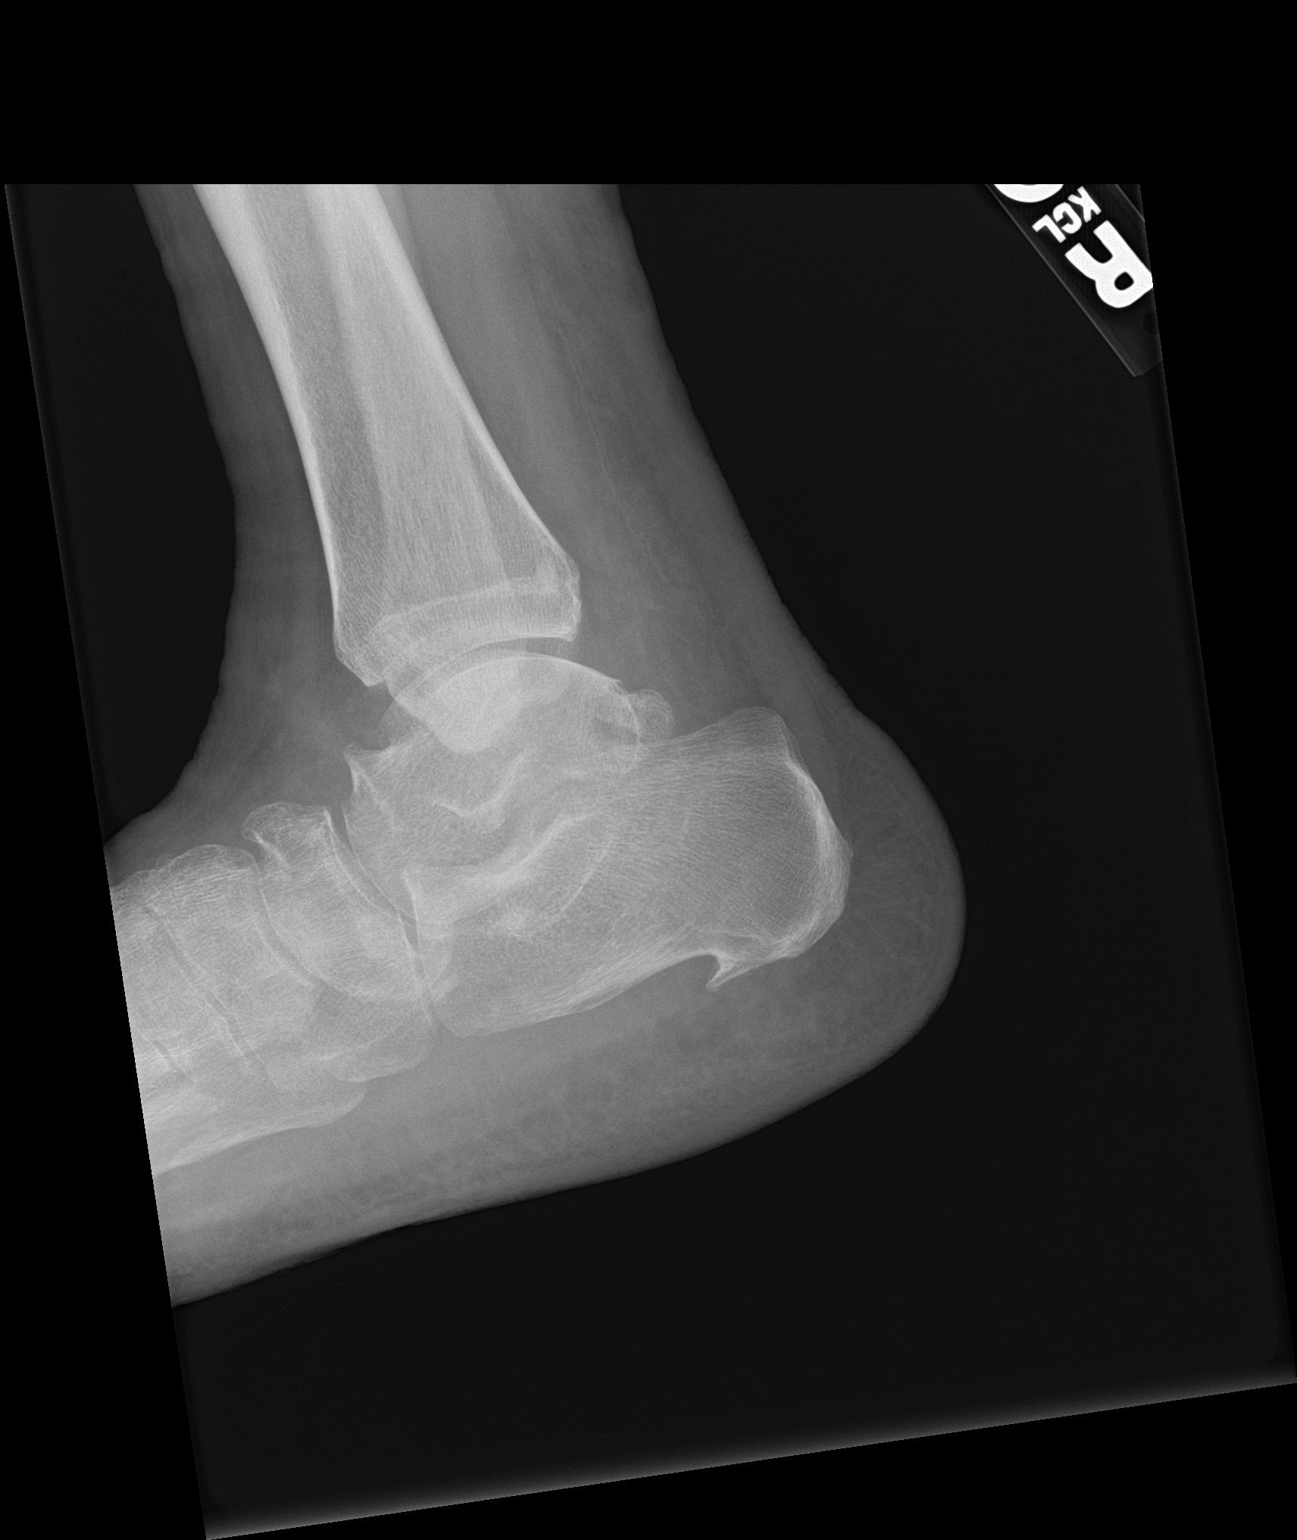

[3 of 3 positions shown; findings below may reference images not displayed]

FINDINGS: No recent fracture or dislocation is seen. There are no focal lytic
lesions. Small plantar spur is seen in calcaneus. Bony spurs seen in
the dorsal aspect of intertarsal joints.
IMPRESSION: No fracture or dislocation is seen. There are no focal lytic
lesions. If there is clinical suspicion for osteomyelitis, follow-up
MRI may be considered.

Plantar spur is seen in calcaneus.

## 2022-01-06 IMAGING — DX DG ANKLE COMPLETE 3+V*L*
3 series · 3 of 3 positions shown · non-contrast
Comparison: None.

CLINICAL DATA: Open wound in the skin, pain

EXAM:
LEFT ANKLE COMPLETE - 3+ VIEW

[ankle ap]
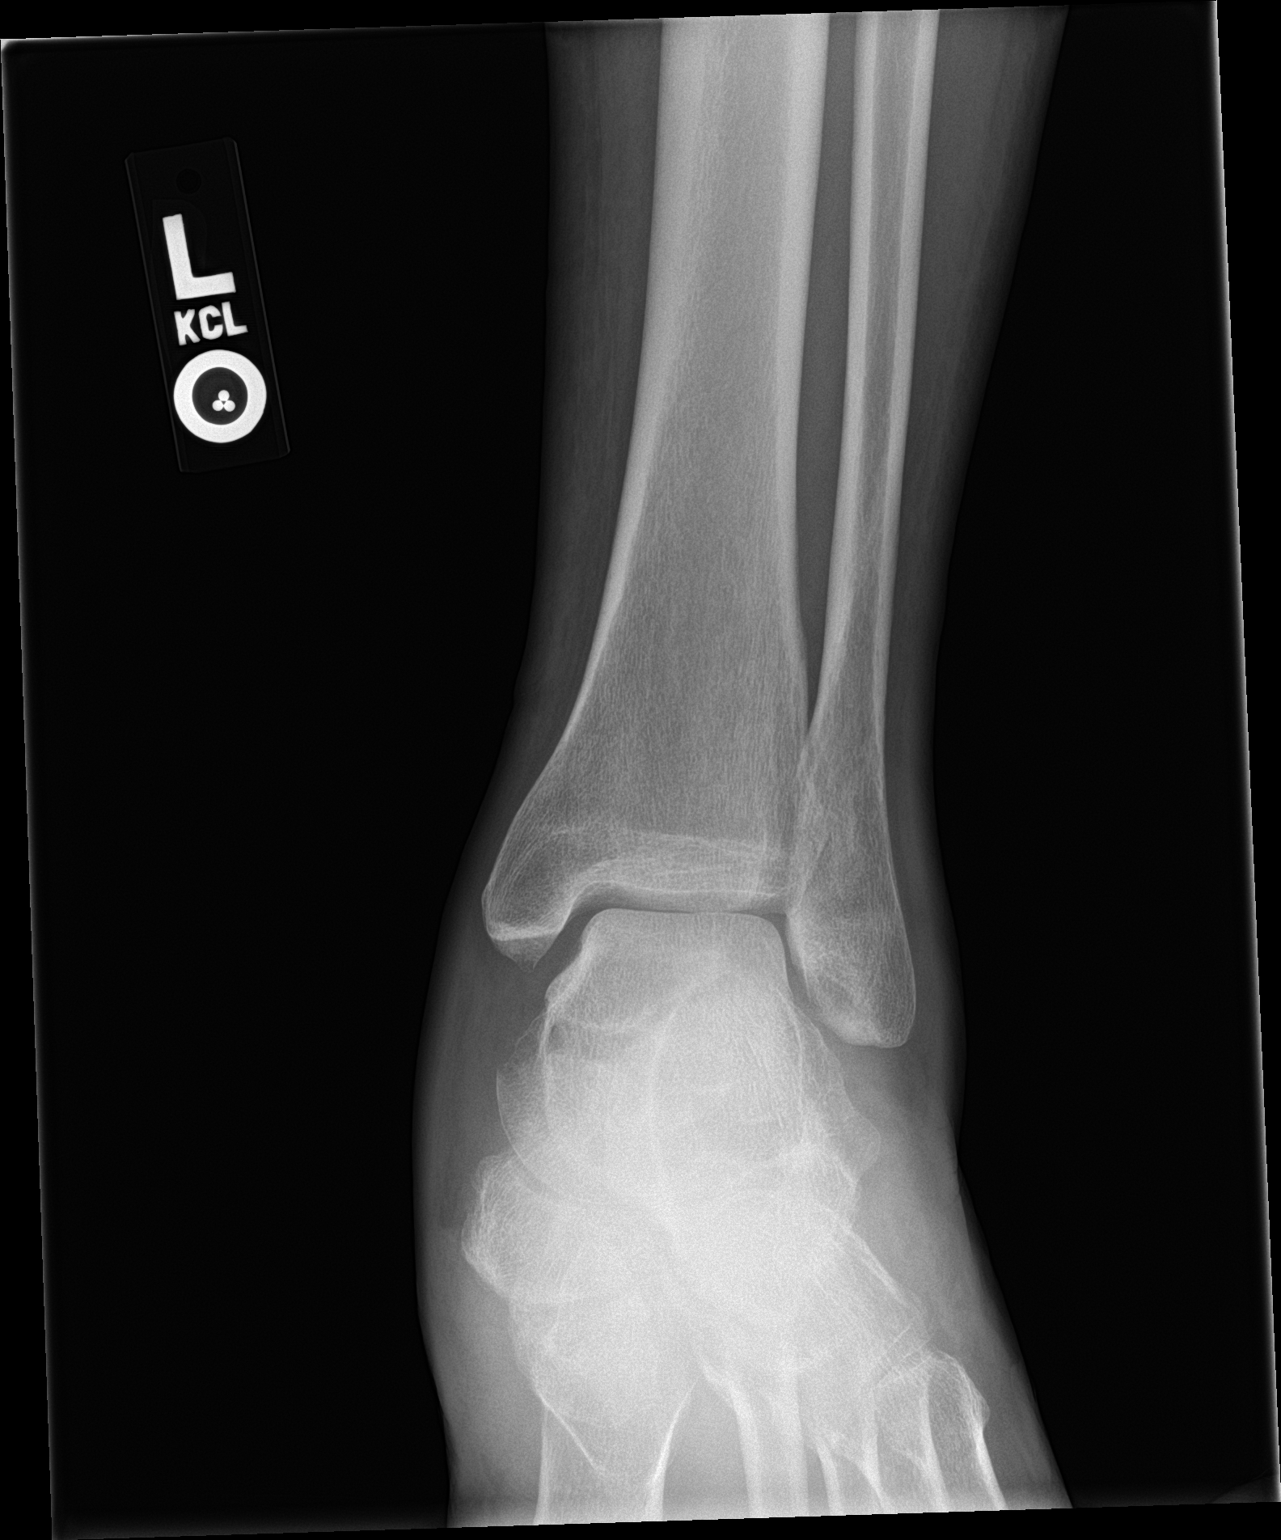

[ankle obl]
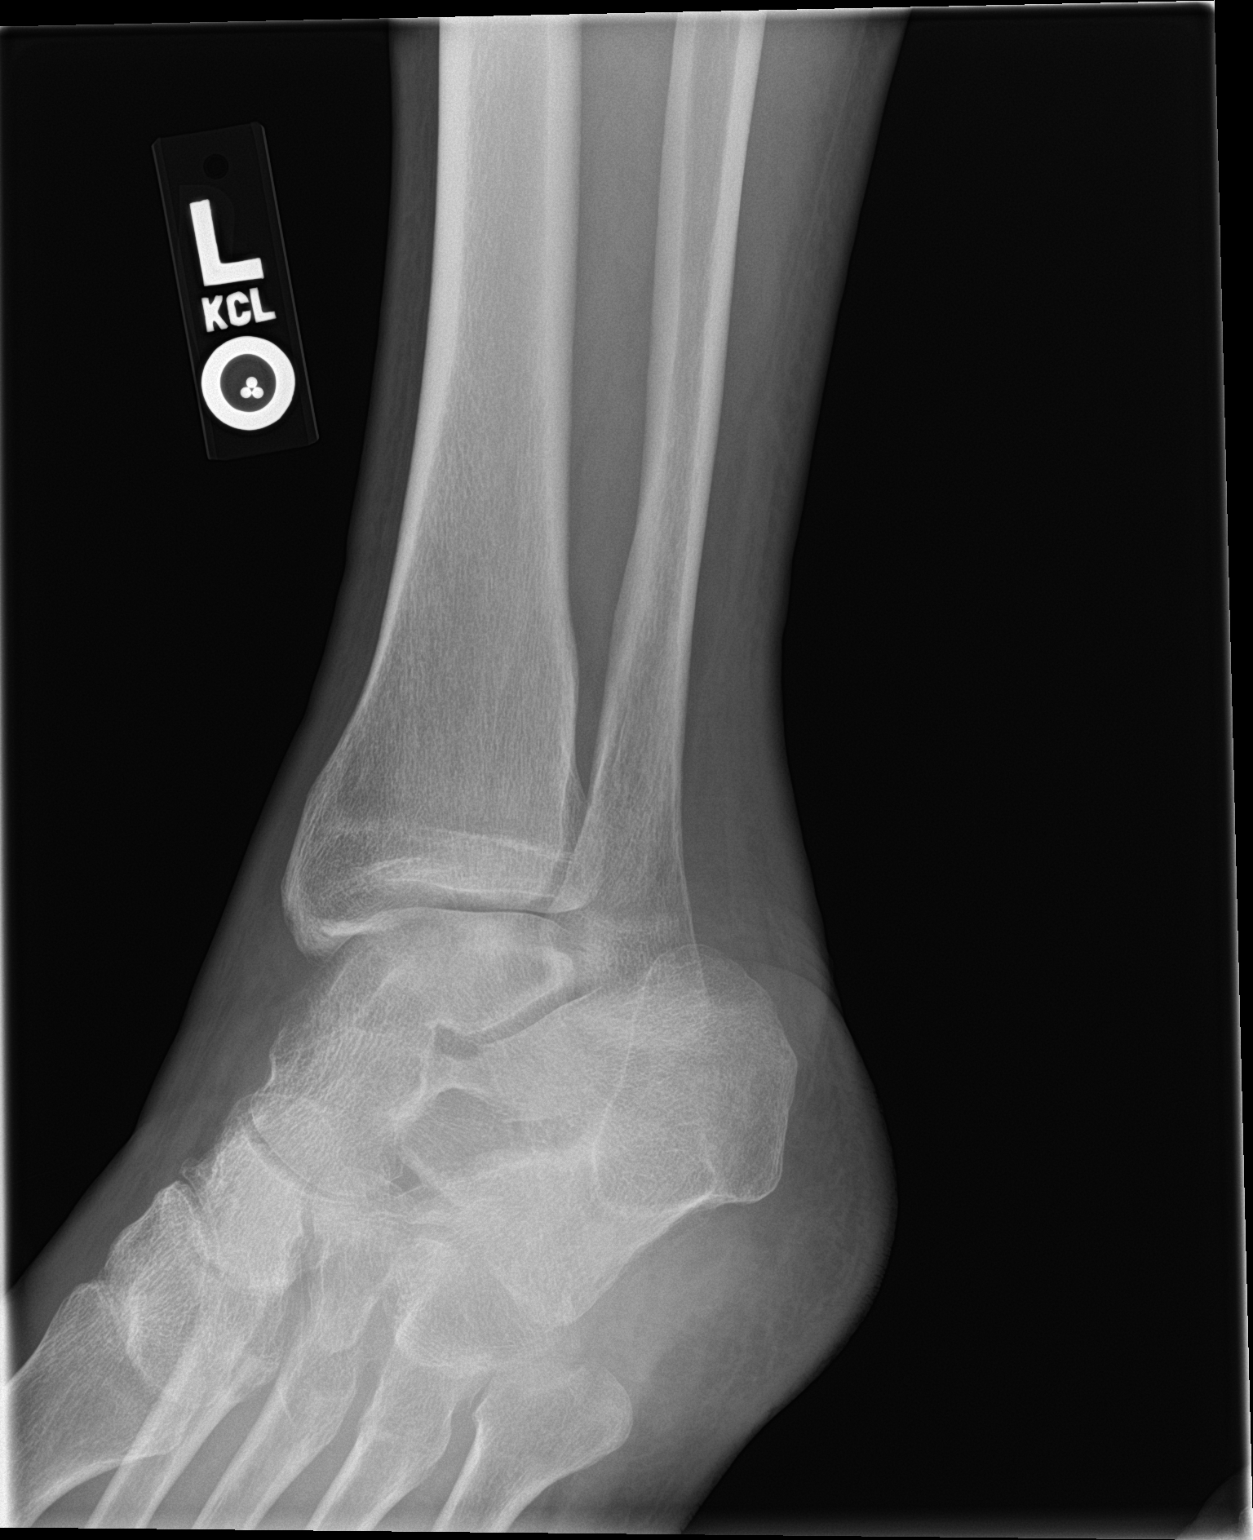

[ankle lat]
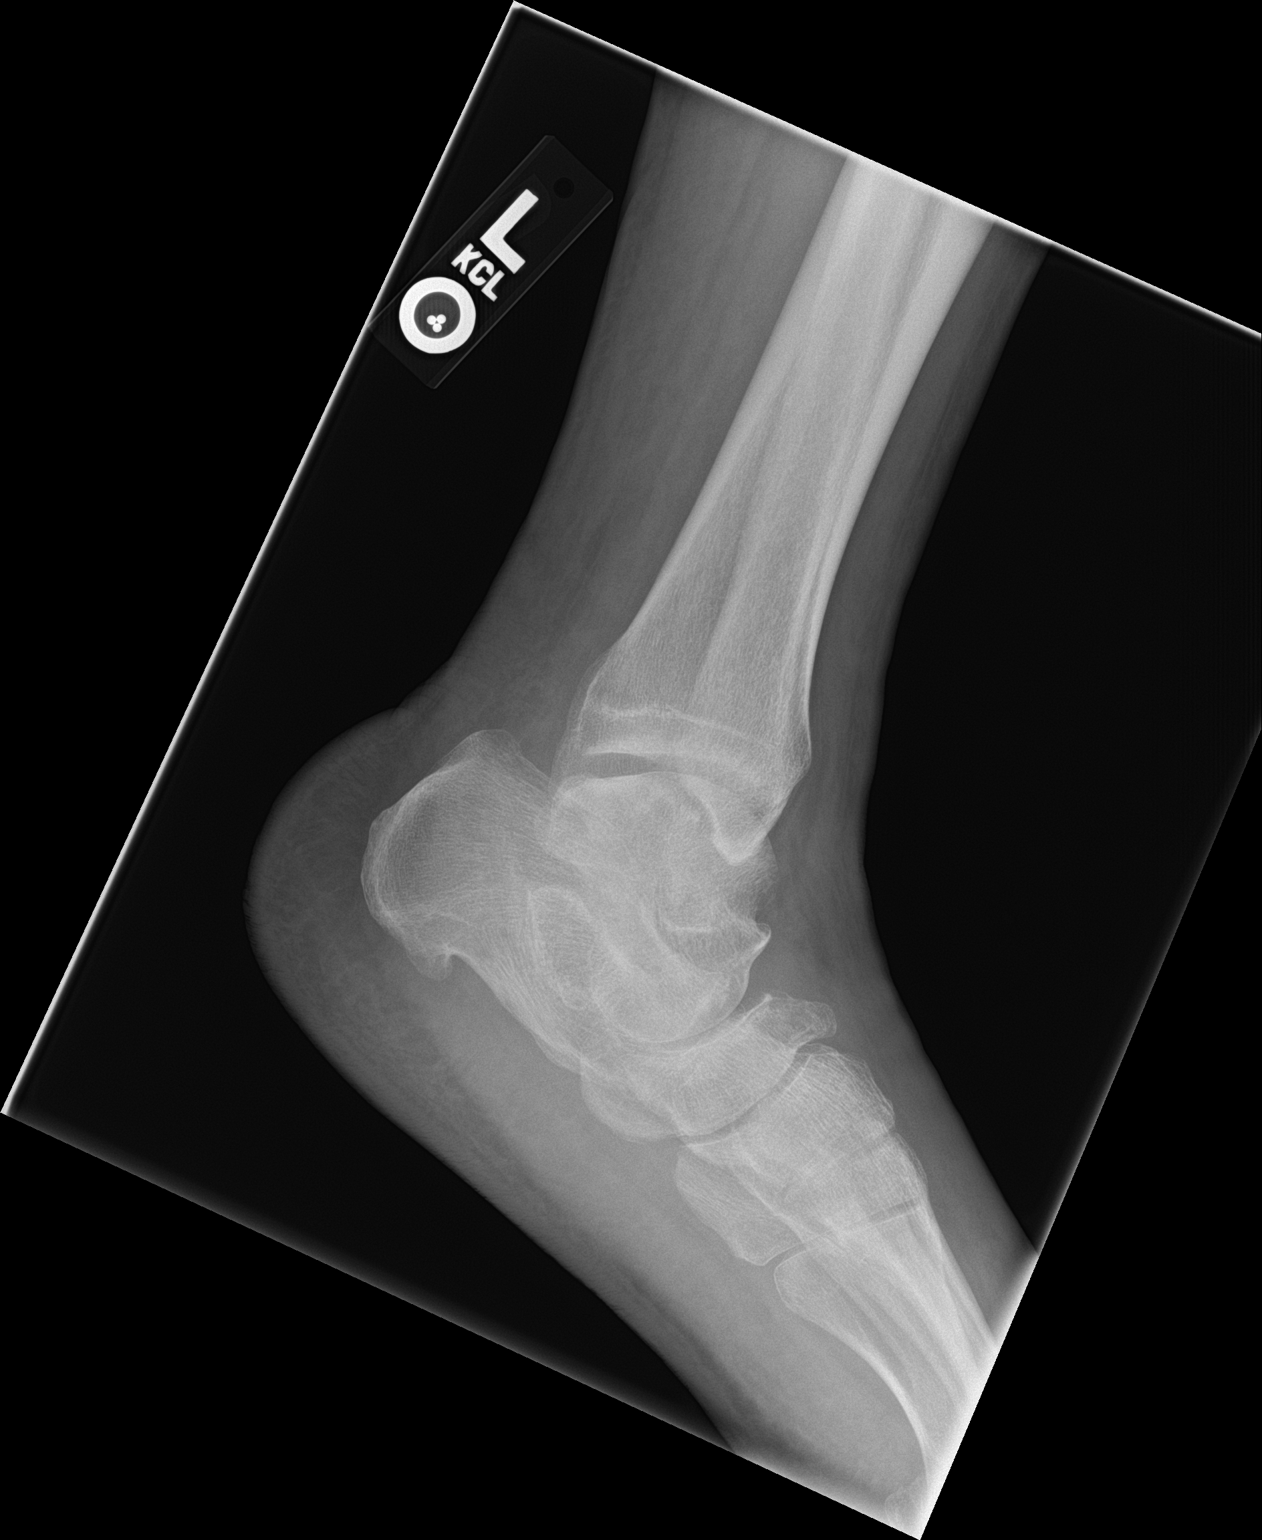

[3 of 3 positions shown; findings below may reference images not displayed]

FINDINGS: No fracture or dislocation is seen. There are no focal lytic
lesions. Plantar spur is seen in calcaneus. Bony spurs seen in the
dorsal aspect of intertarsal joints. There is soft tissue swelling
over the lateral malleolus.
IMPRESSION: No recent fracture or dislocation is seen in the left ankle. There
are no focal lytic lesions. If there is clinical suspicion for
osteomyelitis, follow-up MRI may be considered.

Plantar spur is seen in calcaneus.

## 2022-01-06 MED ORDER — OXYCODONE HCL 5 MG PO TABS
10.0000 mg | ORAL_TABLET | Freq: Once | ORAL | Status: AC
  Administered 2022-01-06: 10 mg via ORAL
  Filled 2022-01-06: qty 2

## 2022-01-06 MED ORDER — FENTANYL CITRATE PF 50 MCG/ML IJ SOSY
100.0000 ug | PREFILLED_SYRINGE | Freq: Once | INTRAMUSCULAR | Status: AC
  Administered 2022-01-06: 100 ug via INTRAMUSCULAR
  Filled 2022-01-06: qty 2

## 2022-01-06 NOTE — ED Triage Notes (Signed)
Per EMs- Patient c/o bilateral foot wounds and saw a wound care nurse 5 days ago. Patient is non compliant. BP-220/150 HR-90-Sats-98%, and CBG-96.

## 2022-01-06 NOTE — ED Notes (Signed)
Safe transport called for patient.    Wrapped bilateral legs with gauze and co-band.   Discharge instructions explained to pt. Pt verbalized understanding.

## 2022-01-06 NOTE — ED Notes (Signed)
Safe transport will be here in 15-30 min.

## 2022-01-06 NOTE — ED Provider Notes (Signed)
[name]   CSN: [id number] Arrival date & time: 01/06/22  1219     History  Chief Complaint  Patient presents with   foot wounds   Hypertension    [name] is a 56 y.o. male.   Hypertension   Patient with history of medical noncompliance with medicine, homelessness, substance use disorder, chronic bilateral ankle ulcerations presents today with ankle pain.  Reports he did see wound care roughly 1 week ago, dressings were changed at that time.  The pain is recently worsened, denies any fevers.  Denies any insult to the ankles.  Not having any paresthesias, not on any antibiotics currently.  He is in a wheelchair at baseline.  Home Medications Prior to Admission medications   Medication Sig Start Date End Date Taking? Authorizing Provider  albuterol (VENTOLIN HFA) 108 (90 Base) MCG/ACT inhaler Inhale 2 puffs into the lungs every 6 (six) hours as needed for wheezing or shortness of breath. 11/09/21   Wyvonnia Dusky, MD  atorvastatin (LIPITOR) 10 MG tablet Take 4 tablets (40 mg total) by mouth daily. 11/09/21   Wyvonnia Dusky, MD  carvedilol (COREG) 6.25 MG tablet Take 1 tablet (6.25 mg total) by mouth 2 (two) times daily with a meal. 12/09/21   [name], MD  levETIRAcetam (KEPPRA) 500 MG tablet Take 1 tablet (500 mg total) by mouth 2 (two) times daily. Patient not taking: Reported on 12/03/2021 11/09/21 12/09/21  Wyvonnia Dusky, MD  OLANZapine (ZYPREXA) 5 MG tablet Take 1 tablet (5 mg total) by mouth daily. Patient not taking: Reported on 12/03/2021 11/25/21   Ethelene Hal, NP  oxyCODONE (OXY IR/ROXICODONE) 5 MG immediate release tablet Take 1 tablet (5 mg total) by mouth every 6 (six) hours as needed for severe pain or breakthrough pain. 12/09/21   [name], MD  pregabalin (LYRICA) 100 MG capsule Take 100 mg by mouth 3 (three) times daily. Patient not taking: Reported on 12/03/2021    [provider]   spironolactone (ALDACTONE) 25 MG tablet Take 1 tablet (25 mg total) by mouth daily. 12/09/21   [name], MD  umeclidinium bromide (INCRUSE ELLIPTA) 62.5 MCG/INH AEPB Inhale 1 puff into the lungs daily. Patient not taking: Reported on 11/24/2021 11/16/20   Flora Lipps, MD      Allergies    Ibuprofen, Acetaminophen, Lyrica cr [pregabalin er], and Peanut-containing drug products    Review of Systems   Review of Systems  Skin:  Positive for wound.   Physical Exam Updated Vital Signs BP 137/90    Pulse (!) 116    Temp 98.5 F (36.9 C) (Oral)    Resp 20    SpO2 100%  Physical Exam Vitals and nursing [name] reviewed. Exam conducted with a chaperone present.  Constitutional:      General: He is not in acute distress.    Appearance: Normal appearance.  HENT:     Head: Normocephalic and atraumatic.  Eyes:     General: No scleral icterus.    Extraocular Movements: Extraocular movements intact.     Pupils: Pupils are equal, round, and reactive to light.  Cardiovascular:     Rate and Rhythm: Regular rhythm. Tachycardia present.  Pulmonary:     Comments: Pulses brisk with Doppler bilaterally, only DP located Musculoskeletal:     Right lower leg: Edema present.     Left lower leg: Edema present.  Skin:    Capillary Refill: Due to level  of onychomycosis not able to obtain an accurate cap refill.    Coloration: Skin is not jaundiced.     Comments: Chronic ulcerations to the ankles, worse on the right.  To the medial side on the right, more lateral on the left.  See pictures below.  Neurological:     Mental Status: He is alert. Mental status is at baseline.     Coordination: Coordination normal.       ED Results / Procedures / Treatments   Labs (all labs ordered are listed, but only abnormal results are displayed) Labs Reviewed - No data to display  EKG None  Radiology DG Ankle Complete Left  Result Date: 01/06/2022 CLINICAL DATA:  Open wound in the skin, pain EXAM: LEFT  ANKLE COMPLETE - 3+ VIEW COMPARISON:  None. FINDINGS: No fracture or dislocation is seen. There are no focal lytic lesions. Plantar spur is seen in calcaneus. Bony spurs seen in the dorsal aspect of intertarsal joints. There is soft tissue swelling over the lateral malleolus. IMPRESSION: No recent fracture or dislocation is seen in the left ankle. There are no focal lytic lesions. If there is clinical suspicion for osteomyelitis, follow-up MRI may be considered. Plantar spur is seen in calcaneus. Electronically Signed   By: Elmer Picker M.D.   On: 01/06/2022 13:19   DG Ankle Complete Right  Result Date: 01/06/2022 CLINICAL DATA:  Open wound in the skin, pain EXAM: RIGHT ANKLE - COMPLETE 3+ VIEW COMPARISON:  None. FINDINGS: No recent fracture or dislocation is seen. There are no focal lytic lesions. Small plantar spur is seen in calcaneus. Bony spurs seen in the dorsal aspect of intertarsal joints. IMPRESSION: No fracture or dislocation is seen. There are no focal lytic lesions. If there is clinical suspicion for osteomyelitis, follow-up MRI may be considered. Plantar spur is seen in calcaneus. Electronically Signed   By: Elmer Picker M.D.   On: 01/06/2022 13:17    Procedures Procedures    Medications Ordered in ED Medications  fentaNYL (SUBLIMAZE) injection 100 mcg (100 mcg Intramuscular Given 01/06/22 1311)  oxyCODONE (Oxy IR/ROXICODONE) immediate release tablet 10 mg (10 mg Oral Given 01/06/22 1358)    ED Course/ Medical Decision Making/ A&P                           Medical Decision Making Amount and/or Complexity of Data Reviewed Radiology: ordered.  Risk Prescription drug management.   This patient presents to the ED for concern of bilateral ankle pain, this involves an extensive number of treatment options, and is a complaint that carries with it a high risk of complications and morbidity.  The differential diagnosis includes suspect most likely secondary to his chronic  ulcers, differential also includes osteomyelitis, fractures, other   Co morbidities that complicate the patient evaluation: Patient does suffer from unstable housing situation, his care is complicated by his homeless status.  Additionally he is medically noncompliant for his other comorbidities.   Additional history obtained: -External records from outside source obtained and reviewed including: Chart review including previous notes, labs, imaging, consultation notes   Imaging Studies ordered: -I ordered imaging studies including plain films of the ankles bilaterally -I independently visualized and interpreted imaging which showed no acute process -I agree with the radiologist interpretation   Medicines ordered and prescription drug management: -I ordered medication including fentanyl and oxycodone for pain -Reevaluation of the patient after these medicines showed that the patient improved -I have  reviewed the patients home medicines and have made adjustments as needed   ED Course: Patient presenting with chronic ulcers to the ankles bilaterally.  Pulses are palpable with Doppler, he is able to flex and extend.  X-rays ordered to assess for any acute underlying trauma, they are negative.  Low suspicion for an acute osteomyelitis, he is not febrile and these are very chronic.  They also appear to be somewhat well-healing.  Do not feel he needs additional work-up at this time, we treated his pain with fentanyl and oxy with improvement.  Patient wounds were rebandaged, patient discharged in stable condition.   Dispostion: D/C         Final Clinical Impression(s) / ED Diagnoses Final diagnoses:  Chronic ulcer of ankle, unspecified laterality, unspecified ulcer stage [facility])    Rx / DC Orders ED Discharge Orders     None         Sherrill Raring, PA-C 01/06/22 1646    Valarie Merino, MD 01/09/22 1520

## 2022-01-06 NOTE — Discharge Instructions (Signed)
Follow-up with wound care as scheduled.  Your dressings were changed today, continue taking your home medicine for pain.

## 2022-01-08 ENCOUNTER — Encounter (HOSPITAL_BASED_OUTPATIENT_CLINIC_OR_DEPARTMENT_OTHER): Payer: Medicaid Other | Admitting: Physician Assistant

## 2022-01-08 ENCOUNTER — Other Ambulatory Visit: Payer: Self-pay

## 2022-01-08 DIAGNOSIS — E11621 Type 2 diabetes mellitus with foot ulcer: Secondary | ICD-10-CM | POA: Diagnosis not present

## 2022-01-08 NOTE — Progress Notes (Addendum)
[name], [name] ([id number]) Visit Report for 01/08/2022 Chief Complaint Document Details Patient Name: Date of Service: [facility] [name], [name] 01/08/2022 3:15 PM Medical Record Number: [id number] Patient Account Number: [id number] Date of Birth/Sex: Treating RN: 01/21/1966 (56 y.o. [name] Primary Care Provider: PCP, NO Other Clinician: Referring Provider: Treating Provider/Extender: Sharalyn Ink in Treatment: 1 Information Obtained from: Patient Chief Complaint Bilateral LE Ulcers Electronic Signature(s) Signed: 01/08/2022 3:37:48 PM By: Worthy Keeler PA-C Entered By: Worthy Keeler on 01/08/2022 15:37:47 -------------------------------------------------------------------------------- Debridement Details Patient Name: Date of Service: [facility] [place], [place] 01/08/2022 3:15 PM Medical Record Number: [id number] Patient Account Number: [id number] Date of Birth/Sex: Treating RN: [birth year] (56 y.o. [name] Primary Care Provider: PCP, NO Other Clinician: Referring Provider: Treating Provider/Extender: Sharalyn Ink in Treatment: 1 Debridement Performed for Assessment: Wound #2 Right,Medial Ankle Performed By: Physician Worthy Keeler, PA Debridement Type: Debridement Severity of Tissue Pre Debridement: Fat layer exposed Level of Consciousness (Pre-procedure): Awake and Alert Pre-procedure Verification/Time Out Yes - 16:15 Taken: Start Time: 16:16 Pain Control: Other : benzocaine 20% T Area Debrided (L x W): otal 2.5 (cm) x 2.5 (cm) = 6.25 (cm) Tissue and other material debrided: Viable, Non-Viable, Slough, Subcutaneous, Skin: Dermis , Skin: Epidermis, Fibrin/Exudate, Slough Level: Skin/Subcutaneous Tissue Debridement Description: Excisional Instrument: Curette Bleeding: Minimum Hemostasis Achieved: Pressure End Time: 16:22 Procedural Pain: 1 Post Procedural Pain: 4 Response to Treatment: Procedure was tolerated well Level of Consciousness (Post- Awake  and Alert procedure): Post Debridement Measurements of Total Wound Length: (cm) 3.1 Width: (cm) 3 Depth: (cm) 0.1 Volume: (cm) 0.73 Character of Wound/Ulcer Post Debridement: Improved Severity of Tissue Post Debridement: Fat layer exposed Post Procedure Diagnosis Same as Pre-procedure Electronic Signature(s) Signed: 01/08/2022 5:11:08 PM By: Worthy Keeler PA-C Signed: 01/08/2022 5:26:44 PM By: Deon Pilling RN, BSN Entered By: Deon Pilling on 01/08/2022 16:22:35 -------------------------------------------------------------------------------- HPI Details Patient Name: Date of Service: [name], [name] 01/08/2022 3:15 PM Medical Record Number: [id number] Patient Account Number: [id number] Date of Birth/Sex: Treating RN: November 05, 1966 (56 y.o. [name] Primary Care Provider: PCP, NO Other Clinician: Referring Provider: Treating Provider/Extender: Sharalyn Ink in Treatment: 1 History of Present Illness HPI Description: 01/01/2022 upon evaluation today patient is a pleasant gentleman who presents today for evaluation here in the clinic initially. Subsequently he is having a lot of issues here with wounds on his bilateral lower extremities that appear to be for the most part venous in nature. I do not see any signs of infection currently although he has had cellulitis a couple times. He does have several contributing factors to this he has good arterial flow I did review his arterial study which showed an ABI on the right of 1.03 and on the left of 1.02 this was May 2022. Previous that his right ABI was 1.15 and left 1.07. He seems to have good blood flow today as well and I see no changes or shifts here to be perfectly honest. That is good news. Nonetheless he does have some chronic medical conditions that are affecting the situation namely his congestive heart failure I will detail this right below. Patient does have a history of diabetes mellitus type 2 although his A1c is 5.8  on most recent check and seems to be doing okay he has not taken any medication at this point though previously he was on metformin. He also has a history of congestive heart failure with ejection fraction of 25%, COPD, and  hypertension. Of note also the patient is currently homeless he is sleeping outside in a sleeping bag. This is definitely not a common complaint as far as his treatment plan is concerned. He did have Medicaid in [place] but recently moved to [place] area. His Medicaid has not flipped over yet although he does have a Education officer, museum with him today who is working on that aspect. 01/08/2022 upon evaluation today patient appears to be doing decently well in regard to his wound. He has been tolerating the dressing changes without complication. He did have to go to the hospital to have his wraps removed last week as a starting to hurt I think it may have been that the dressings were getting somewhat dry. We will see what I can do today to try to prevent that from happening. Electronic Signature(s) Signed: 01/08/2022 4:55:23 PM By: Worthy Keeler PA-C Entered By: Worthy Keeler on 01/08/2022 16:55:23 -------------------------------------------------------------------------------- Physical Exam Details Patient Name: Date of Service: [facility] [place], [place] 01/08/2022 3:15 PM Medical Record Number: [id number] Patient Account Number: [id number] Date of Birth/Sex: Treating RN: 06-21-1966 (56 y.o. [name] Primary Care Provider: PCP, NO Other Clinician: Referring Provider: Treating Provider/Extender: Sharalyn Ink in Treatment: 1 Constitutional Well-nourished and well-hydrated in no acute distress. Respiratory normal breathing without difficulty. Psychiatric this patient is able to make decisions and demonstrates good insight into disease process. Alert and Oriented x 3. pleasant and cooperative. Notes Upon inspection patient's wound bed actually showed signs of  good granulation and epithelization at this point. Fortunately I do not see any signs of infection and I feel like he is actually tremendously better which is great news. No fevers, chills, nausea, vomiting, or diarrhea. Electronic Signature(s) Signed: 01/08/2022 4:55:42 PM By: Worthy Keeler PA-C Entered By: Worthy Keeler on 01/08/2022 16:55:41 -------------------------------------------------------------------------------- Physician Orders Details Patient Name: Date of Service: [facility] [place], [place] 01/08/2022 3:15 PM Medical Record Number: [id number] Patient Account Number: [id number] Date of Birth/Sex: Treating RN: [birth year] (56 y.o. 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 - Wednesday with Margarita Grizzle, [place], RN. Return A Other: - Pain clinic referral resent. Someone should call you. ***Patient to purchase compression stockings. leg measurements provided. bring in next week.*** Bathing/ Shower/ Hygiene May shower with protection but do not get wound dressing(s) wet. Edema Control - Lymphedema / SCD / Other Elevate legs to the level of the heart or above for 30 minutes daily and/or when sitting, a frequency of: - 3-4 times a day throughout the day. Avoid standing for long periods of  time. Exercise regularly Other Edema Control Orders/Instructions: - apply lotion with TCA cream, kerlix and ace wrap to left foot and leg. Additional Orders / Instructions Stop/Decrease Smoking Follow Nutritious Diet Wound Treatment Wound #1 - Lower Leg Wound Laterality: Right, Posterior Cleanser: Soap and Water 1 x Per Week/30 Days Discharge Instructions: May shower and wash  wound with dial antibacterial soap and water prior to dressing change. Cleanser: Wound Cleanser 1 x Per Week/30 Days Discharge Instructions: Cleanse the wound with wound cleanser prior to applying a clean dressing using gauze sponges, not tissue or cotton balls. Peri-Wound Care: Triamcinolone 15 (g) 1 x Per Week/30 Days Discharge Instructions: Use triamcinolone 15 (g) as directed Peri-Wound Care: Sween Lotion (Moisturizing lotion) 1 x Per Week/30 Days Discharge Instructions: Apply moisturizing lotion as directed Topical: Triamcinolone 1 x Per Week/30 Days Discharge Instructions: Apply Triamcinolone as directed Prim Dressing: Xeroform Occlusive Gauze Dressing, 4x4 in 1 x Per Week/30 Days ary Discharge Instructions: Apply to wound bed as instructed Secondary Dressing: ABD Pad, 8x10 1 x Per Week/30 Days Discharge Instructions: Apply over primary dressing as directed. Secondary Dressing: ADAPTIC TOUCH 3x4.25 in 1 x Per Week/30 Days Discharge Instructions: Apply over primary dressing as directed. Compression Wrap: Kerlix Roll 4.5x3.1 (in/yd) 1 x Per Week/30 Days Discharge Instructions: Apply Kerlix and Coban compression as directed. Compression Wrap: ace wrap 1 x Per Week/30 Days Discharge Instructions: apply ace wrap from foot to just below knee. Wound #2 - Ankle Wound Laterality: Right, Medial Cleanser: Soap and Water 1 x Per Week/30 Days Discharge Instructions: May shower and wash wound with dial antibacterial soap and water prior to dressing change. Cleanser: Wound Cleanser 1 x Per Week/30 Days Discharge  Instructions: Cleanse the wound with wound cleanser prior to applying a clean dressing using gauze sponges, not tissue or cotton balls. Peri-Wound Care: Triamcinolone 15 (g) 1 x Per Week/30 Days Discharge Instructions: Use triamcinolone 15 (g) as directed Peri-Wound Care: Sween Lotion (Moisturizing lotion) 1 x Per Week/30 Days Discharge Instructions: Apply moisturizing lotion as directed Topical: Triamcinolone 1 x Per Week/30 Days Discharge Instructions: Apply Triamcinolone as directed Prim Dressing: Xeroform Occlusive Gauze Dressing, 4x4 in 1 x Per Week/30 Days ary Discharge Instructions: Apply to wound bed as instructed Secondary Dressing: ABD Pad, 8x10 1 x Per Week/30 Days Discharge Instructions: Apply over primary dressing as directed. Secondary Dressing: ADAPTIC TOUCH 3x4.25 in 1 x Per Week/30 Days Discharge Instructions: Apply over primary dressing as directed. Compression Wrap: Kerlix Roll 4.5x3.1 (in/yd) 1 x Per Week/30 Days Discharge Instructions: Apply Kerlix and Coban compression as directed. Compression Wrap: ace wrap 1 x Per Week/30 Days Discharge Instructions: apply ace wrap from foot to just below knee. Electronic Signature(s) Signed: 01/08/2022 5:11:08 PM By: Worthy Keeler PA-C Signed: 01/08/2022 5:26:44 PM By: Deon Pilling RN, BSN Entered By: Deon Pilling on 01/08/2022 16:26:58 -------------------------------------------------------------------------------- Problem List Details Patient Name: Date of Service: [facility] [place], [place] 01/08/2022 3:15 PM Medical Record Number: [id number] Patient Account Number: [id number] Date of Birth/Sex: Treating RN: March 08, 1966 (56 y.o. [name] Primary Care Provider: PCP, NO Other Clinician: Referring Provider: Treating Provider/Extender: Sharalyn Ink in Treatment: 1 Active Problems ICD-10 Encounter Encounter Code Description Active Date MDM Diagnosis E11.621 Type 2 diabetes mellitus with foot ulcer 01/01/2022 No  Yes L97.512 Non-pressure chronic ulcer of other part of right foot with fat layer exposed 01/01/2022 No Yes L97.312 Non-pressure chronic ulcer of right ankle with fat layer exposed 01/01/2022 No Yes L97.822 Non-pressure chronic ulcer of other part of left lower leg with fat layer exposed2/12/2021 No Yes I50.42 Chronic combined systolic (congestive) and diastolic (congestive) heart failure 01/01/2022 No Yes J44.9 Chronic obstructive pulmonary disease, unspecified 01/01/2022 No Yes I10 Essential (primary) hypertension 01/01/2022 No Yes Inactive Problems Resolved Problems Electronic Signature(s) Signed: 01/08/2022 3:37:38 PM By: Worthy Keeler PA-C Entered By: Joaquim Lai  IIIMargarita Grizzle on 01/08/2022 15:37:38 -------------------------------------------------------------------------------- Progress Note Details Patient Name: Date of Service: [facility] [place], [place] 01/08/2022 3:15 PM Medical Record Number: [id number] Patient Account Number: [id number] Date of Birth/Sex: Treating RN: 11/15/1966 (56 y.o. [name] Primary Care Provider: PCP, NO Other Clinician: Referring Provider: Treating Provider/Extender: Sharalyn Ink in Treatment: 1 Subjective Chief Complaint Information obtained from Patient Bilateral LE Ulcers History of Present Illness (HPI) 01/01/2022 upon evaluation today patient is a pleasant gentleman who presents today for evaluation here in the clinic initially. Subsequently he is having a lot of issues here with wounds on his bilateral lower extremities that appear to be for the most part venous in nature. I do not see any signs of infection currently although he has had cellulitis a couple times. He does have several contributing factors to this he has good arterial flow I did review his arterial study which showed an ABI on the right of 1.03 and on the left of 1.02 this was May 2022. Previous that his right ABI was 1.15 and left 1.07. He seems to have good blood flow today as well and I see no  changes or shifts here to be perfectly honest. That is good news. Nonetheless he does have some chronic medical conditions that are affecting the situation namely his congestive heart failure I will detail this right below. Patient does have a history of diabetes mellitus type 2 although his A1c is 5.8 on most recent check and seems to be doing okay he has not taken any medication at this point though previously he was on metformin. He also has a history of congestive heart failure with ejection fraction of 25%, COPD, and hypertension. Of note also the patient is currently homeless he is sleeping outside in a sleeping bag. This is definitely not a common complaint as far as his treatment plan is concerned. He did have Medicaid in [place] but recently moved to [place] area. His Medicaid has not flipped over yet although he does have a Education officer, museum with him today who is working on that aspect. 01/08/2022 upon evaluation today patient appears to be doing decently well in regard to his wound. He has been tolerating the dressing changes without complication. He did have to go to the hospital to have his wraps removed last week as a starting to hurt I think it may have been that the dressings were getting somewhat dry. We will see what I can do today to try to prevent that from happening. Objective Constitutional Well-nourished and well-hydrated in no acute distress. Vitals Time Taken: 3:30 PM, Height: 69 in, Weight: 195 lbs, BMI: 28.8, Temperature: 99.2 F, Pulse: 101 bpm, Respiratory Rate: 20 breaths/min, Blood Pressure: 169/85 mmHg. Respiratory normal breathing without difficulty. Psychiatric this patient is able to make decisions and demonstrates good insight into disease process. Alert and Oriented x 3. pleasant and cooperative. General Notes: Upon inspection patient's wound bed actually showed signs of good granulation and epithelization at this point. Fortunately I do not see  any signs of infection and I feel like he is actually tremendously better which is great news. No fevers, chills, nausea, vomiting, or diarrhea. Integumentary (Hair, Skin) Wound #1 status is Open. Original cause of wound was Gradually Appeared. The date acquired was: 08/31/2021. The wound has been in treatment 1 weeks. The wound is located on the Right,Posterior Lower Leg. The wound measures 0.7cm length x 0.6cm width x 0.1cm depth; 0.33cm^2 area and 0.033cm^3 volume. There is  no tunneling or undermining noted. There is a medium amount of serosanguineous drainage noted. The wound margin is distinct with the outline attached to the wound base. There is large (67-100%) red, pink granulation within the wound bed. There is a small (1-33%) amount of necrotic tissue within the wound bed including Adherent Slough. Wound #2 status is Open. Original cause of wound was Gradually Appeared. The date acquired was: 08/31/2021. The wound has been in treatment 1 weeks. The wound is located on the Right,Medial Ankle. The wound measures 3.1cm length x 3cm width x 0.1cm depth; 7.304cm^2 area and 0.73cm^3 volume. There is Fat Layer (Subcutaneous Tissue) exposed. There is no tunneling or undermining noted. There is a medium amount of serosanguineous drainage noted. The wound margin is distinct with the outline attached to the wound base. There is large (67-100%) red, pink granulation within the wound bed. There is a small (1-33%) amount of necrotic tissue within the wound bed including Adherent Slough. Wound #3 status is Healed - Epithelialized. Original cause of wound was Gradually Appeared. The date acquired was: 08/31/2021. The wound has been in treatment 1 weeks. The wound is located on the Right,Medial Foot. The wound measures 0cm length x 0cm width x 0cm depth; 0cm^2 area and 0cm^3 volume. 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 There was a Excisional Skin/Subcutaneous Tissue Debridement with a total area of 6.25 sq cm performed by Worthy Keeler, PA. With the following instrument(s): Curette to remove Viable and Non-Viable tissue/material. Material removed includes Subcutaneous Tissue, Slough, Skin: Dermis, Skin: Epidermis, and Fibrin/Exudate after achieving pain control using Other (benzocaine 20%). A time out was conducted at 16:15, prior to the start of the procedure. A Minimum amount of bleeding was controlled with Pressure. The procedure was tolerated well with a pain level of 1 throughout and a pain level of 4 following the procedure. Post Debridement Measurements: 3.1cm length x 3cm width x 0.1cm depth; 0.73cm^3 volume. Character of Wound/Ulcer Post Debridement is improved. Severity of Tissue Post Debridement is: Fat layer exposed. Post procedure Diagnosis Wound #2: Same as Pre-Procedure Plan Follow-up Appointments: Return Appointment in 1 week. - Wednesday with Margarita Grizzle, [place], RN. Other: - Pain clinic referral resent. Someone should call you. ***Patient to purchase compression stockings. leg measurements provided. bring in next week.*** Bathing/ Shower/ Hygiene: May shower with protection but do not get wound dressing(s) wet. Edema Control - Lymphedema / SCD / Other: Elevate legs to the level of the heart or above for 30 minutes daily and/or when sitting, a frequency of: - 3-4  times a day throughout the day. Avoid standing for long periods of time. Exercise regularly Other Edema Control Orders/Instructions: - apply lotion with TCA cream, kerlix and ace wrap to left foot and leg. Additional Orders / Instructions: Stop/Decrease Smoking Follow Nutritious Diet WOUND #1: - Lower Leg Wound Laterality: Right, Posterior Cleanser: Soap and Water 1 x Per Week/30 Days Discharge Instructions: May shower and wash wound with dial antibacterial  soap and water prior to dressing change. Cleanser: Wound Cleanser 1 x Per Week/30 Days Discharge Instructions: Cleanse the wound with wound cleanser prior to applying a clean dressing using gauze sponges, not tissue or cotton balls. Peri-Wound Care: Triamcinolone 15 (g) 1 x Per Week/30 Days Discharge Instructions: Use triamcinolone 15 (g) as directed Peri-Wound Care: Sween Lotion (Moisturizing lotion) 1 x Per Week/30 Days Discharge Instructions: Apply moisturizing lotion as directed Topical: Triamcinolone 1 x Per Week/30 Days Discharge Instructions: Apply Triamcinolone as directed Prim Dressing: Xeroform Occlusive Gauze Dressing, 4x4 in 1 x Per Week/30 Days ary Discharge Instructions: Apply to wound bed as instructed Secondary Dressing: ABD Pad, 8x10 1 x Per Week/30 Days Discharge Instructions: Apply over primary dressing as directed. Secondary Dressing: ADAPTIC TOUCH 3x4.25 in 1 x Per Week/30 Days Discharge Instructions: Apply over primary dressing as directed. Com pression Wrap: Kerlix Roll 4.5x3.1 (in/yd) 1 x Per Week/30 Days Discharge Instructions: Apply Kerlix and Coban compression as directed. Com pression Wrap: ace wrap 1 x Per Week/30 Days Discharge Instructions: apply ace wrap from foot to just below knee. WOUND #2: - Ankle Wound Laterality: Right, Medial Cleanser: Soap and Water 1 x Per Week/30 Days Discharge Instructions: May shower and wash wound with dial antibacterial soap and water prior to dressing change. Cleanser:  Wound Cleanser 1 x Per Week/30 Days Discharge Instructions: Cleanse the wound with wound cleanser prior to applying a clean dressing using gauze sponges, not tissue or cotton balls. Peri-Wound Care: Triamcinolone 15 (g) 1 x Per Week/30 Days Discharge Instructions: Use triamcinolone 15 (g) as directed Peri-Wound Care: Sween Lotion (Moisturizing lotion) 1 x Per Week/30 Days Discharge Instructions: Apply moisturizing lotion as directed Topical: Triamcinolone 1 x Per Week/30 Days Discharge Instructions: Apply Triamcinolone as directed Prim Dressing: Xeroform Occlusive Gauze Dressing, 4x4 in 1 x Per Week/30 Days ary Discharge Instructions: Apply to wound bed as instructed Secondary Dressing: ABD Pad, 8x10 1 x Per Week/30 Days Discharge Instructions: Apply over primary dressing as directed. Secondary Dressing: ADAPTIC TOUCH 3x4.25 in 1 x Per Week/30 Days Discharge Instructions: Apply over primary dressing as directed. Com pression Wrap: Kerlix Roll 4.5x3.1 (in/yd) 1 x Per Week/30 Days Discharge Instructions: Apply Kerlix and Coban compression as directed. Com pression Wrap: ace wrap 1 x Per Week/30 Days Discharge Instructions: apply ace wrap from foot to just below knee. 1. Would recommend that we going to continue with the wound care measures as before and the patient is in agreement with plan. This includes the use of the Xeroform gauze dressing that we will use some triamcinolone underneath. 2. We can use Mepitel or Adaptic over top to try to keep this from drying out. 3. We will use Kerlix to secure in place in an Ace wrap so hopefully this will not get too tight or if it does he can loosen it a little bit so hopefully will not have to end up in the hospital again. We will see patient back for reevaluation in 1 week here in the clinic. If anything worsens or changes patient will contact our office for additional recommendations. Electronic Signature(s) Signed: 01/08/2022 4:56:18 PM By: Worthy Keeler PA-C Entered By: Worthy Keeler on 01/08/2022 16:56:18 -------------------------------------------------------------------------------- SuperBill Details Patient Name: Date of Service: [facility] [place], [place] 01/08/2022 Medical Record Number: [id number] Patient Account Number: [id number] Date of Birth/Sex: Treating RN: 1966/05/31 (56 y.o. 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 L97.822 Non-pressure chronic ulcer of other part of left lower leg with fat layer exposed I50.42 Chronic combined systolic (congestive) and diastolic (congestive) heart failure J44.9 Chronic obstructive pulmonary disease, unspecified I10 Essential (primary) hypertension Facility Procedures CPT4 Code: [id number] Description: [id number] - DEB SUBQ TISSUE 20 SQ CM/< ICD-10 Diagnosis Description L97.312 Non-pressure chronic ulcer of right ankle with fat layer exposed Modifier: Quantity: 1 Physician Procedures : CPT4 Code Description Modifier [id number] - WC PHYS SUBQ TISS 20 SQ CM ICD-10 Diagnosis Description [id number] Non-pressure chronic ulcer of right ankle with fat layer exposed Quantity: 1 Electronic Signature(s) Signed: 01/08/2022 4:56:28 PM By: Worthy Keeler PA-C Entered By: Worthy Keeler on 01/08/2022 16:56:28

## 2022-01-09 NOTE — Progress Notes (Signed)
[name], [name] ([id number]) Visit Report for 01/08/2022 Arrival Information Details Patient Name: Date of Service: [name] [name], [name] 01/08/2022 3:15 PM Medical Record Number: [id number] Patient Account Number: [id number] Date of Birth/Sex: Treating RN: 07/11/1966 (56 y.o. [name], [name] Primary Care [name]: PCP, NO Other Clinician: Referring [name]: Treating [name]/Extender: [name] in Treatment: 1 Visit Information History Since Last Visit Added or deleted any medications: No Patient Arrived: Wheel Chair Any new allergies or adverse reactions: No Arrival Time: 15:28 Had a fall or experienced change in No Accompanied By: case manager activities of daily living that may affect Transfer Assistance: Manual risk of falls: Patient Identification Verified: Yes Signs or symptoms of abuse/neglect since last visito No Secondary Verification Process Completed: Yes Hospitalized since last visit: No Patient Requires Transmission-Based No Implantable device outside of the clinic excluding No Precautions: cellular tissue based products placed in the center Patient Has Alerts: Yes since last visit: Patient Alerts: 04/23/21 ABI: L 1.02 Has Dressing in Place as Prescribed: Yes R1.03 Pain Present Now: No Electronic Signature(s) Signed: 01/09/2022 3:24:34 PM By: Sandre Kitty Entered By: Sandre Kitty on 01/08/2022 15:30:38 -------------------------------------------------------------------------------- Lower Extremity Assessment Details Patient Name: Date of Service: [name] 01/08/2022 3:15 PM Medical Record Number: [id number] Patient Account Number: [id number] Date of Birth/Sex: Treating RN: August 23, 1966 (56 y.o. [name] Primary Care [name]: PCP, NO Other Clinician: Referring [name]: Treating [name]/Extender: [name] in Treatment: 1 Edema Assessment Assessed: [Left: Yes] [Right: Yes] Edema: [Left: No] [Right: Yes] Calf Left:  Right: Point of Measurement: 36 cm From Medial Instep 36.5 cm 37.5 cm Ankle Left: Right: Point of Measurement: 13 cm From Medial Instep 23 cm 23.5 cm Knee To Floor Left: Right: From Medial Instep 46 cm Vascular Assessment Pulses: Dorsalis Pedis Palpable: [Left:Yes] [Right:Yes] Electronic Signature(s) Signed: 01/08/2022 4:24:10 PM By: Lorrin Jackson Signed: 01/08/2022 5:26:44 PM By: Deon Pilling RN, BSN Entered By: Deon Pilling on 01/08/2022 16:18:13 -------------------------------------------------------------------------------- Multi-Disciplinary Care Plan Details Patient Name: Date of Service: [name] 01/08/2022 3:15 PM Medical Record Number: [id number] Patient Account Number: [id number] Date of Birth/Sex: Treating RN: 1966/08/19 (56 y.o. [name] Primary Care [name]: PCP, NO Other Clinician: Referring [name]: Treating [name]/Extender: [name] in Treatment: 1 Active Inactive Pain, Acute or Chronic Nursing Diagnoses: Pain, acute or chronic: actual or potential Potential alteration in comfort, pain Goals: Patient will verbalize adequate pain control and receive pain control interventions during procedures as needed Date Initiated: 01/01/2022 Target Resolution Date: 02/06/2022 Goal Status: Active Patient/caregiver will verbalize comfort level met Date Initiated: 01/01/2022 Target Resolution Date: 02/07/2022 Goal Status: Active Interventions: Encourage patient to take pain medications as prescribed Provide education on pain management Treatment Activities: Administer pain control measures as ordered : 01/01/2022 Notes: Wound/Skin Impairment Nursing Diagnoses: Knowledge deficit related to ulceration/compromised skin integrity Goals: Patient/caregiver will verbalize understanding of skin care regimen Date Initiated: 01/01/2022 Target Resolution Date: 02/06/2022 Goal Status: Active Interventions: Assess patient/caregiver ability to obtain  necessary supplies Assess patient/caregiver ability to perform ulcer/skin care regimen upon admission and as needed Provide education on ulcer and skin care Treatment Activities: Skin care regimen initiated : 01/01/2022 Topical wound management initiated : 01/01/2022 Notes: Electronic Signature(s) Signed: 01/08/2022 5:26:44 PM By: Deon Pilling RN, BSN Entered By: Deon Pilling on 01/08/2022 16:18:42 -------------------------------------------------------------------------------- Pain Assessment Details Patient Name: Date of Service: [name], [name] 01/08/2022 3:15 PM Medical Record Number: [id number] Patient Account Number: [id number] Date of Birth/Sex: Treating RN: 01-17-1966 (56 y.o.  [name] Primary Care [name]: PCP, NO Other Clinician: Referring [name]: Treating [name]/Extender: [name] in Treatment: 1 Active Problems Location of Pain Severity and Description of Pain Patient Has Paino Yes Site Locations Rate the pain. Current Pain Level: 10 Pain Management and Medication Current Pain Management: Electronic Signature(s) Signed: 01/08/2022 5:26:44 PM By: Deon Pilling RN, BSN Signed: 01/09/2022 3:24:34 PM By: Sandre Kitty Entered By: Sandre Kitty on 01/08/2022 15:31:12 -------------------------------------------------------------------------------- Patient/Caregiver Education Details Patient Name: Date of Service: [name], [name] 2/8/2023andnbsp3:15 PM Medical Record Number: [id number] Patient Account Number: [id number] Date of Birth/Gender: Treating RN: 1966-11-21 (56 y.o. [name] Primary Care Physician: PCP, NO Other Clinician: Referring Physician: Treating Physician/Extender: [name] in Treatment: 1 Education Assessment Education Provided To: Patient Education Topics Provided Venous: Handouts: Controlling Swelling with Compression Stockings Methods: Explain/Verbal, Printed Responses: Reinforcements  needed Electronic Signature(s) Signed: 01/08/2022 5:26:44 PM By: Deon Pilling RN, BSN Entered By: Deon Pilling on 01/08/2022 16:19:24 -------------------------------------------------------------------------------- Wound Assessment Details Patient Name: Date of Service: [name] Bishop [name], [name] 01/08/2022 3:15 PM Medical Record Number: [id number] Patient Account Number: [id number] Date of Birth/Sex: Treating RN: [birth year] (56 y.o. [name] Primary Care Beautiful Pensyl: PCP, NO Other Clinician: Referring Oryan Winterton: Treating Saavi Mceachron/Extender: [name] in Treatment: 1 Wound Status Wound Number: 1 Primary Diabetic Wound/Ulcer of the Lower Extremity Etiology: Wound Location: Right, Posterior Lower Leg Secondary Lymphedema Wounding Event: Gradually Appeared Etiology: Date Acquired: 08/31/2021 Wound Open Weeks Of Treatment: 1 Status: Clustered Wound: No Comorbid Chronic Obstructive Pulmonary Disease (COPD), Congestive History: Heart Failure, Hypertension, Type II Diabetes Photos Wound Measurements Length: (cm) 0.7 Width: (cm) 0.6 Depth: (cm) 0.1 Area: (cm) 0.33 Volume: (cm) 0.033 % Reduction in Area: 90.7% % Reduction in Volume: 90.7% Epithelialization: Large (67-100%) Tunneling: No Undermining: No Wound Description Classification: Unable to visualize wound bed Wound Margin: Distinct, outline attached Exudate Amount: Medium Exudate Type: Serosanguineous Exudate Color: red, brown Foul Odor After Cleansing: No Slough/Fibrino No Wound Bed Granulation Amount: Large (67-100%) Exposed Structure Granulation Quality: Red, Pink Fascia Exposed: No Necrotic Amount: Small (1-33%) Fat Layer (Subcutaneous Tissue) Exposed: No Necrotic Quality: Adherent Slough Tendon Exposed: No Muscle Exposed: No Joint Exposed: No Bone Exposed: No Electronic Signature(s) Signed: 01/08/2022 4:24:10 PM By: Lorrin Jackson Signed: 01/08/2022 5:26:44 PM By: Deon Pilling RN, BSN Entered By:  Lorrin Jackson on 01/08/2022 16:07:19 -------------------------------------------------------------------------------- Wound Assessment Details Patient Name: Date of Service: [name], [name] 01/08/2022 3:15 PM Medical Record Number: [id number] Patient Account Number: [id number] Date of Birth/Sex: Treating RN: 1966-08-22 (56 y.o. [name] Primary Care Idamae Coccia: PCP, NO Other Clinician: Referring Briany Aye: Treating Alasdair Kleve/Extender: [name] in Treatment: 1 Wound Status Wound Number: 2 Primary Diabetic Wound/Ulcer of the Lower Extremity Etiology: Wound Location: Right, Medial Ankle Secondary Venous Leg Ulcer Wounding Event: Gradually Appeared Etiology: Date Acquired: 08/31/2021 Wound Open Weeks Of Treatment: 1 Status: Clustered Wound: No Comorbid Chronic Obstructive Pulmonary Disease (COPD), Congestive History: Heart Failure, Hypertension, Type II Diabetes Photos Wound Measurements Length: (cm) 3.1 Width: (cm) 3 Depth: (cm) 0.1 Area: (cm) 7.304 Volume: (cm) 0.73 % Reduction in Area: 24.1% % Reduction in Volume: 24.1% Epithelialization: Small (1-33%) Tunneling: No Undermining: No Wound Description Classification: Grade 2 Wound Margin: Distinct, outline attached Exudate Amount: Medium Exudate Type: Serosanguineous Exudate Color: red, brown Foul Odor After Cleansing: No Slough/Fibrino Yes Wound Bed Granulation Amount: Large (67-100%) Exposed Structure Granulation Quality: Red, Pink Fascia Exposed: No Necrotic Amount: Small (1-33%) Fat Layer (Subcutaneous Tissue) Exposed:  Yes Necrotic Quality: Adherent Slough Tendon Exposed: No Muscle Exposed: No Joint Exposed: No Bone Exposed: No Electronic Signature(s) Signed: 01/08/2022 4:24:10 PM By: Lorrin Jackson Signed: 01/08/2022 5:26:44 PM By: Deon Pilling RN, BSN Entered By: Lorrin Jackson on 01/08/2022 16:08:09 -------------------------------------------------------------------------------- Wound  Assessment Details Patient Name: Date of Service: [name] [place], [place] 01/08/2022 3:15 PM Medical Record Number: [id number] Patient Account Number: [id number] Date of Birth/Sex: Treating RN: 1965-12-16 (56 y.o. [name] Primary Care Quinnlan Abruzzo: PCP, NO Other Clinician: Referring Dandre Sisler: Treating Hannah Crill/Extender: [name] in Treatment: 1 Wound Status Wound Number: 3 Primary Diabetic Wound/Ulcer of the Lower Extremity Etiology: Wound Location: Right, Medial Foot Wound Healed - Epithelialized Wounding Event: Gradually Appeared Status: Date Acquired: 08/31/2021 Comorbid Chronic Obstructive Pulmonary Disease (COPD), Congestive Weeks Of Treatment: 1 History: Heart Failure, Hypertension, Type II Diabetes Clustered Wound: No Photos Wound Measurements Length: (cm) Width: (cm) Depth: (cm) Area: (cm) Volume: (cm) 0 % Reduction in Area: 100% 0 % Reduction in Volume: 100% 0 0 0 Wound Description Classification: Unable to visualize wound bed Electronic Signature(s) Signed: 01/08/2022 4:24:10 PM By: Lorrin Jackson Entered By: Lorrin Jackson on 01/08/2022 16:05:52 -------------------------------------------------------------------------------- Vitals Details Patient Name: Date of Service: [facility] [name], [place] 01/08/2022 3:15 PM Medical Record Number: [id number] Patient Account Number: [id number] Date of Birth/Sex: Treating RN: 1966-08-18 (56 y.o. [name] Primary Care Stephany Poorman: PCP, NO Other Clinician: Referring Gala Padovano: Treating Noya Santarelli/Extender: [name] in Treatment: 1 Vital Signs Time Taken: 15:30 Temperature (F): 99.2 Height (in): 69 Pulse (bpm): 101 Weight (lbs): 195 Respiratory Rate (breaths/min): 20 Body Mass Index (BMI): 28.8 Blood Pressure (mmHg): 169/85 Reference Range: 80 - 120 mg / dl Electronic Signature(s) Signed: 01/09/2022 3:24:34 PM By: Sandre Kitty Entered By: Sandre Kitty on 01/08/2022 15:30:58

## 2022-01-15 ENCOUNTER — Encounter (HOSPITAL_BASED_OUTPATIENT_CLINIC_OR_DEPARTMENT_OTHER): Payer: Medicaid Other | Admitting: Internal Medicine

## 2022-01-15 ENCOUNTER — Other Ambulatory Visit: Payer: Self-pay

## 2022-01-15 DIAGNOSIS — E11621 Type 2 diabetes mellitus with foot ulcer: Secondary | ICD-10-CM | POA: Diagnosis not present

## 2022-01-15 NOTE — Progress Notes (Signed)
[name], [name] ([id number]) Visit Report for 01/15/2022 Arrival Information Details Patient Name: Date of Service: [place], [place] 01/15/2022 2:15 PM Medical Record Number: [id number] Patient Account Number: [id number] Date of Birth/Sex: Treating RN: 1966-01-24 (56 y.o. [name] Primary Care Kathyjo Briere: PCP, NO Other Clinician: Referring [name]: Treating [name]/Extender: [name] in Treatment: 2 Visit Information History Since Last Visit Added or deleted any medications: No Patient Arrived: Wheel Chair Any new allergies or adverse reactions: No Arrival Time: 14:10 Had a fall or experienced change in No Accompanied By: self activities of daily living that may affect Transfer Assistance: None risk of falls: Patient Identification Verified: Yes Signs or symptoms of abuse/neglect since last visito No Secondary Verification Process Completed: Yes Hospitalized since last visit: No Patient Requires Transmission-Based No Implantable device outside of the clinic excluding No Precautions: cellular tissue based products placed in the center Patient Has Alerts: Yes since last visit: Patient Alerts: 04/23/21 ABI: L 1.02 Has Dressing in Place as Prescribed: Yes R1.03 Has Compression in Place as Prescribed: Yes Pain Present Now: No Electronic Signature(s) Signed: 01/15/2022 5:30:32 PM By: Deon Pilling RN, BSN Entered By: Deon Pilling on 01/15/2022 17:08:06 -------------------------------------------------------------------------------- Clinic Level of Care Assessment Details Patient Name: Date of Service: [facility] [place], [place] 01/15/2022 2:15 PM Medical Record Number: [id number] Patient Account Number: [id number] Date of Birth/Sex: Treating RN: 1966/06/08 (56 y.o. [name] Primary Care [name]: PCP, NO Other Clinician: Referring [name]: Treating [name]/Extender: [name] in Treatment: 2 Clinic Level of Care Assessment Items TOOL 4 Quantity  Score X- 1 0 Use when only an EandM is performed on FOLLOW-UP visit ASSESSMENTS - Nursing Assessment / Reassessment X- 1 10 Reassessment of [name]-morbidities (includes updates in patient status) X- 1 5 Reassessment of Adherence to Treatment Plan ASSESSMENTS - Wound and Skin A ssessment / Reassessment []  - 0 Simple Wound Assessment / Reassessment - one wound X- 2 5 Complex Wound Assessment / Reassessment - multiple wounds X- 1 10 Dermatologic / Skin Assessment (not related to wound area) ASSESSMENTS - Focused Assessment []  - 0 Circumferential Edema Measurements - multi extremities []  - 0 Nutritional Assessment / Counseling / Intervention []  - 0 Lower Extremity Assessment (monofilament, tuning fork, pulses) []  - 0 Peripheral Arterial Disease Assessment (using hand held doppler) ASSESSMENTS - Ostomy and/or Continence Assessment and Care []  - 0 Incontinence Assessment and Management []  - 0 Ostomy Care Assessment and Management (repouching, etc.) PROCESS - Coordination of Care []  - 0 Simple Patient / Family Education for ongoing care X- 1 20 Complex (extensive) Patient / Family Education for ongoing care X- 1 10 Staff obtains Programmer, systems, Records, T Results / Process Orders est []  - 0 Staff telephones HHA, Nursing Homes / Clarify orders / etc []  - 0 Routine Transfer to another Facility (non-emergent condition) []  - 0 Routine Hospital Admission (non-emergent condition) []  - 0 New Admissions / Biomedical engineer / Ordering NPWT Apligraf, etc. , []  - 0 Emergency Hospital Admission (emergent condition) []  - 0 Simple Discharge Coordination X- 1 15 Complex (extensive) Discharge Coordination PROCESS - Special Needs []  - 0 Pediatric / Minor Patient Management []  - 0 Isolation Patient Management []  - 0 Hearing / Language / Visual special needs []  - 0 Assessment of Community assistance (transportation, D/C planning, etc.) []  - 0 Additional assistance / Altered  mentation []  - 0 Support Surface(s) Assessment (bed, cushion, seat, etc.) INTERVENTIONS - Wound Cleansing / Measurement []  - 0 Simple Wound Cleansing - one wound X- 2  5 Complex Wound Cleansing - multiple wounds X- 1 5 Wound Imaging (photographs - any number of wounds) []  - 0 Wound Tracing (instead of photographs) []  - 0 Simple Wound Measurement - one wound X- 2 5 Complex Wound Measurement - multiple wounds INTERVENTIONS - Wound Dressings []  - 0 Small Wound Dressing one or multiple wounds []  - 0 Medium Wound Dressing one or multiple wounds X- 2 20 Large Wound Dressing one or multiple wounds []  - 0 Application of Medications - topical []  - 0 Application of Medications - injection INTERVENTIONS - Miscellaneous []  - 0 External ear exam []  - 0 Specimen Collection (cultures, biopsies, blood, body fluids, etc.) []  - 0 Specimen(s) / Culture(s) sent or taken to Lab for analysis []  - 0 Patient Transfer (multiple staff / Civil Service fast streamer / Similar devices) []  - 0 Simple Staple / Suture removal (25 or less) []  - 0 Complex Staple / Suture removal (26 or more) []  - 0 Hypo / Hyperglycemic Management (close monitor of Blood Glucose) []  - 0 Ankle / Brachial Index (ABI) - do not check if billed separately X- 1 5 Vital Signs Has the patient been seen at the hospital within the last three years: Yes Total Score: 150 Level Of Care: New/Established - Level 4 Electronic Signature(s) Signed: 01/15/2022 5:30:32 PM By: Deon Pilling RN, BSN Entered By: Deon Pilling on 01/15/2022 17:11:59 -------------------------------------------------------------------------------- Encounter Discharge Information Details Patient Name: Date of Service: [facility] [place], [place] 01/15/2022 2:15 PM Medical Record Number: [id number] Patient Account Number: [id number] Date of Birth/Sex: Treating RN: 1966-12-01 (56 y.o. [name] Primary Care Jkwon Treptow: PCP, NO Other Clinician: Referring [name]: Treating  [name] in Treatment: 2 Encounter Discharge Information Items Discharge Condition: Stable Ambulatory Status: Wheelchair Discharge Destination: Home Transportation: Private Auto Accompanied By: self Schedule Follow-up Appointment: Yes Clinical Summary of Care: Notes applied lotion and ace wrap to left leg. explained to patient to call pain clinic has we have faxed x2 and phoned them to ensure they have his social worker's number, which is the only way to reach patient, explained the pain clinic has been trying to reach out to patient and Education officer, museum. Explained the phone may report the number as spam to reach out to them. Provided patient with their number and try to purchase those compression stockings for the future. Patient in agreement. Electronic Signature(s) Signed: 01/15/2022 5:30:32 PM By: Deon Pilling RN, BSN Entered By: Deon Pilling on 01/15/2022 17:14:47 -------------------------------------------------------------------------------- Patient/Caregiver Education Details Patient Name: Date of Service: [name] [place], [name] 2/15/2023andnbsp2:15 PM Medical Record Number: [id number] Patient Account Number: [id number] Date of Birth/Gender: Treating RN: 11-25-1966 (56 y.o. [name] Primary Care Physician: PCP, NO Other Clinician: Referring Physician: Treating Physician/Extender: [name] in Treatment: 2 Education Assessment Education Provided To: Patient Education Topics Provided Wound/Skin Impairment: Handouts: Skin Care Do's and Dont's Methods: Explain/Verbal Responses: Reinforcements needed Electronic Signature(s) Signed: 01/15/2022 5:30:32 PM By: Deon Pilling RN, BSN Entered By: Deon Pilling on 01/15/2022 17:11:13 -------------------------------------------------------------------------------- Wound Assessment Details Patient Name: Date of Service: [name] [place], [place] 01/15/2022 2:15 PM Medical Record Number:  [id number] Patient Account Number: [id number] Date of Birth/Sex: Treating RN: [birth year] (56 y.o. [name] Primary Care Gerrod Maule: PCP, NO Other Clinician: Referring Nyelah Emmerich: Treating Mimie Goering/Extender: [name] in Treatment: 2 Wound Status Wound Number: 1 Primary Etiology: Diabetic Wound/Ulcer of the Lower Extremity Wound Location: Right, Posterior Lower Leg Secondary Etiology: Lymphedema Wounding Event: Gradually Appeared Wound Status: Open Date Acquired: 08/31/2021 Weeks Of  Treatment: 2 Clustered Wound: No Wound Measurements Length: (cm) 0.7 Width: (cm) 0.6 Depth: (cm) 0.1 Area: (cm) 0.33 Volume: (cm) 0.033 % Reduction in Area: 90.7% % Reduction in Volume: 90.7% Wound Description Classification: Unable to visualize wound bed Exudate Amount: Medium Exudate Type: Serosanguineous Exudate Color: red, brown Treatment Notes Wound #1 (Lower Leg) Wound Laterality: Right, Posterior Cleanser Soap and Water Discharge Instruction: May shower and wash wound with dial antibacterial soap and water prior to dressing change. Wound Cleanser Discharge Instruction: Cleanse the wound with wound cleanser prior to applying a clean dressing using gauze sponges, not tissue or cotton balls. Peri-Wound Care Triamcinolone 15 (g) Discharge Instruction: Use triamcinolone 15 (g) as directed Sween Lotion (Moisturizing lotion) Discharge Instruction: Apply moisturizing lotion as directed Topical Triamcinolone Discharge Instruction: Apply Triamcinolone as directed Primary Dressing Xeroform Occlusive Gauze Dressing, 4x4 in Discharge Instruction: Apply to wound bed as instructed Secondary Dressing ABD Pad, 8x10 Discharge Instruction: Apply over primary dressing as directed. ADAPTIC TOUCH 3x4.25 in Discharge Instruction: Apply over primary dressing as directed. Secured With Compression Wrap Kerlix Roll 4.5x3.1 (in/yd) Discharge Instruction: Apply Kerlix and Coban  compression as directed. ace wrap Discharge Instruction: apply ace wrap from foot to just below knee. Compression Stockings Add-Ons Electronic Signature(s) Signed: 01/15/2022 5:30:32 PM By: Deon Pilling RN, BSN Entered By: Deon Pilling on 01/15/2022 17:10:50 -------------------------------------------------------------------------------- Wound Assessment Details Patient Name: Date of Service: [facility] [place], [place] 01/15/2022 2:15 PM Medical Record Number: [id number] Patient Account Number: [id number] Date of Birth/Sex: Treating RN: 1966-05-06 (56 y.o. [name] Primary Care Amiri Tritch: PCP, NO Other Clinician: Referring [name] Hufstetler: Treating Danon Lograsso/Extender: [name] in Treatment: 2 Wound Status Wound Number: 2 Primary Etiology: Diabetic Wound/Ulcer of the Lower Extremity Wound Location: Right, Medial Ankle Secondary Etiology: Venous Leg Ulcer Wounding Event: Gradually Appeared Wound Status: Open Date Acquired: 08/31/2021 Weeks Of Treatment: 2 Clustered Wound: No Wound Measurements Length: (cm) 3.1 Width: (cm) 3 Depth: (cm) 0.1 Area: (cm) 7.304 Volume: (cm) 0.73 % Reduction in Area: 24.1% % Reduction in Volume: 24.1% Wound Description Classification: Grade 2 Exudate Amount: Medium Exudate Type: Serosanguineous Exudate Color: red, brown Treatment Notes Wound #2 (Ankle) Wound Laterality: Right, Medial Cleanser Soap and Water Discharge Instruction: May shower and wash wound with dial antibacterial soap and water prior to dressing change. Wound Cleanser Discharge Instruction: Cleanse the wound with wound cleanser prior to applying a clean dressing using gauze sponges, not tissue or cotton balls. Peri-Wound Care Triamcinolone 15 (g) Discharge Instruction: Use triamcinolone 15 (g) as directed Sween Lotion (Moisturizing lotion) Discharge Instruction: Apply moisturizing lotion as directed Topical Triamcinolone Discharge Instruction: Apply Triamcinolone  as directed Primary Dressing Xeroform Occlusive Gauze Dressing, 4x4 in Discharge Instruction: Apply to wound bed as instructed Secondary Dressing ABD Pad, 8x10 Discharge Instruction: Apply over primary dressing as directed. ADAPTIC TOUCH 3x4.25 in Discharge Instruction: Apply over primary dressing as directed. Secured With Compression Wrap Kerlix Roll 4.5x3.1 (in/yd) Discharge Instruction: Apply Kerlix and Coban compression as directed. ace wrap Discharge Instruction: apply ace wrap from foot to just below knee. Compression Stockings Add-Ons Electronic Signature(s) Signed: 01/15/2022 5:30:32 PM By: Deon Pilling RN, BSN Entered By: Deon Pilling on 01/15/2022 17:10:50 -------------------------------------------------------------------------------- Vitals Details Patient Name: Date of Service: [name] [place], [place] 01/15/2022 2:15 PM Medical Record Number: [id number] Patient Account Number: [id number] Date of Birth/Sex: Treating RN: Jul 21, 1966 (56 y.o. [name] Primary Care Blia Totman: PCP, NO Other Clinician: Referring Brighten Orndoff: Treating Raed Schalk/Extender: [name] in Treatment: 2 Vital Signs Time Taken: 14:10 Temperature (  F): 97.5 Height (in): 69 Pulse (bpm): 68 Weight (lbs): 195 Respiratory Rate (breaths/min): 20 Body Mass Index (BMI): 28.8 Blood Pressure (mmHg): 160/84 Reference Range: 80 - 120 mg / dl Electronic Signature(s) Signed: 01/15/2022 5:30:32 PM By: Deon Pilling RN, BSN Entered By: Deon Pilling on 01/15/2022 17:10:06

## 2022-01-16 NOTE — Progress Notes (Signed)
[name], [name] ([id number]) Visit Report for 01/15/2022 SuperBill Details Patient Name: Date of Service: [facility] [place], [place] 01/15/2022 Medical Record Number: [id number] Patient Account Number: [id number] Date of Birth/Sex: Treating RN: [birth year] (56 y.o. 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

## 2022-01-21 ENCOUNTER — Emergency Department (HOSPITAL_COMMUNITY)
Admission: EM | Admit: 2022-01-21 | Discharge: 2022-01-21 | Disposition: A | Discharge status: Home / Self Care | Payer: Medicaid Other | Attending: Emergency Medicine | Admitting: Emergency Medicine

## 2022-01-21 ENCOUNTER — Encounter (HOSPITAL_COMMUNITY): Payer: Self-pay | Admitting: Emergency Medicine

## 2022-01-21 DIAGNOSIS — G8929 Other chronic pain: Secondary | ICD-10-CM

## 2022-01-21 DIAGNOSIS — Z91010 Allergy to peanuts: Secondary | ICD-10-CM | POA: Insufficient documentation

## 2022-01-21 DIAGNOSIS — M79605 Pain in left leg: Secondary | ICD-10-CM | POA: Insufficient documentation

## 2022-01-21 DIAGNOSIS — M79604 Pain in right leg: Secondary | ICD-10-CM

## 2022-01-21 DIAGNOSIS — L97319 Non-pressure chronic ulcer of right ankle with unspecified severity: Secondary | ICD-10-CM

## 2022-01-21 MED ORDER — HYDROMORPHONE HCL 1 MG/ML IJ SOLN
1.0000 mg | Freq: Once | INTRAMUSCULAR | Status: AC
  Administered 2022-01-21: 1 mg via INTRAMUSCULAR
  Filled 2022-01-21: qty 1

## 2022-01-21 MED ORDER — ALBUTEROL SULFATE HFA 108 (90 BASE) MCG/ACT IN AERS
2.0000 | INHALATION_SPRAY | Freq: Once | RESPIRATORY_TRACT | Status: AC
  Administered 2022-01-21: 2 via RESPIRATORY_TRACT
  Filled 2022-01-21: qty 6.7

## 2022-01-21 NOTE — Discharge Instructions (Addendum)
Please follow-up with your primary care doctor and your pain doctor and your wound care doctor.  Come back to ER if you have increased swelling, redness, fever, numbness, or other new concerning symptom.  You have an appointment with the wound care clinic tomorrow.  Do not miss this appointment.  Discuss pain management with this provider.  Future Appointments  Date Time Provider Department Center  01/22/2022 11:00 AM Allen Derry [facility]

## 2022-01-21 NOTE — ED Notes (Signed)
I provided reinforced discharge education based off of discharge instructions. Pt acknowledged and understood my education. Pt had no further questions/concerns for provider/myself.  °

## 2022-01-21 NOTE — ED Triage Notes (Signed)
Per EMS-complaining of B/L leg/foot pain-chronic issue

## 2022-01-21 NOTE — ED Provider Notes (Signed)
[facility]-EMERGENCY DEPT Provider Note   CSN: [id number] Arrival date & time: 01/21/22  1308     History  Chief Complaint  Patient presents with   Leg Pain   Foot Pain    [name] is a 56 y.o. male.  Presents to the emergency department with concern for bilateral leg pain.  Patient states he suffers from pain in both of his legs on a long-term basis.  States today the pain seems to be worse than normal.  States that he has difficulty controlling his pain.  He has not noticed any new swelling, redness or drainage today.  No fevers.  States that he is frustrated his pain and wounds are not getting better despite going to the wound care center.  Has not noticed any acute worsening of the wounds but has not seen any significant improvement since going to wound care.  HPI     Home Medications Prior to Admission medications   Medication Sig Start Date End Date Taking? Authorizing Provider  albuterol (VENTOLIN HFA) 108 (90 Base) MCG/ACT inhaler Inhale 2 puffs into the lungs every 6 (six) hours as needed for wheezing or shortness of breath. 11/09/21   Terald Sleeper, MD  atorvastatin (LIPITOR) 10 MG tablet Take 4 tablets (40 mg total) by mouth daily. 11/09/21   Terald Sleeper, MD  carvedilol (COREG) 6.25 MG tablet Take 1 tablet (6.25 mg total) by mouth 2 (two) times daily with a meal. 12/09/21   Meredeth Ide, MD  levETIRAcetam (KEPPRA) 500 MG tablet Take 1 tablet (500 mg total) by mouth 2 (two) times daily. Patient not taking: Reported on 12/03/2021 11/09/21 12/09/21  Terald Sleeper, MD  OLANZapine (ZYPREXA) 5 MG tablet Take 1 tablet (5 mg total) by mouth daily. Patient not taking: Reported on 12/03/2021 11/25/21   Laveda Abbe, NP  oxyCODONE (OXY IR/ROXICODONE) 5 MG immediate release tablet Take 1 tablet (5 mg total) by mouth every 6 (six) hours as needed for severe pain or breakthrough pain. 12/09/21   Meredeth Ide, MD  pregabalin (LYRICA) 100 MG  capsule Take 100 mg by mouth 3 (three) times daily. Patient not taking: Reported on 12/03/2021    [provider]  spironolactone (ALDACTONE) 25 MG tablet Take 1 tablet (25 mg total) by mouth daily. 12/09/21   Meredeth Ide, MD  umeclidinium bromide (INCRUSE ELLIPTA) 62.5 MCG/INH AEPB Inhale 1 puff into the lungs daily. Patient not taking: Reported on 11/24/2021 11/16/20   Joycelyn Das, MD      Allergies    Ibuprofen, Acetaminophen, Lyrica cr [pregabalin er], and Peanut-containing drug products    Review of Systems   Review of Systems  Musculoskeletal:  Positive for arthralgias.  Skin:  Positive for wound.  All other systems reviewed and are negative.  Physical Exam Updated Vital Signs BP (!) 162/136 (BP Location: Left Arm) Comment: Pt jerking arm while BP taking.  Cannot get pt to remain still enough to obtain manual BP   Pulse 91    Temp 97.7 F (36.5 C) (Oral)    Resp (!) 22    SpO2 93%  Physical Exam Vitals and nursing note reviewed.  Constitutional:      General: He is not in acute distress.    Appearance: He is well-developed.  HENT:     Head: Normocephalic and atraumatic.  Eyes:     Conjunctiva/sclera: Conjunctivae normal.  Cardiovascular:     Rate and Rhythm: Normal rate.  Pulses: Normal pulses.     Heart sounds: No murmur heard. Pulmonary:     Effort: Pulmonary effort is normal. No respiratory distress.     Breath sounds: Normal breath sounds.  Abdominal:     Palpations: Abdomen is soft.     Tenderness: There is no abdominal tenderness.  Musculoskeletal:        General: No swelling.     Cervical back: Neck supple.     Comments: RLE: there is chronic 2-3cm diameter wound to medial mallelous region, no surrounding erythema or induration, s/p toe amputations, foot feels warm and well perfused, strong dp doppler pulses LLE: no significant induration or area of flucutance noted, no overlying erythema, strong doppler dp pulse, s/p great toe amputation   Skin:    General: Skin is warm and dry.     Capillary Refill: Capillary refill takes less than 2 seconds.     Comments: See above  Neurological:     Mental Status: He is alert.  Psychiatric:        Mood and Affect: Mood normal.    ED Results / Procedures / Treatments   Labs (all labs ordered are listed, but only abnormal results are displayed) Labs Reviewed - No data to display  EKG None  Radiology No results found.  Procedures Procedures    Medications Ordered in ED Medications  albuterol (VENTOLIN HFA) 108 (90 Base) MCG/ACT inhaler 2 puff (has no administration in time range)  HYDROmorphone (DILAUDID) injection 1 mg (1 mg Intramuscular Given 01/21/22 1720)    ED Course/ Medical Decision Making/ A&P                           Medical Decision Making Risk Prescription drug management.   56 year old male presenting to ER with concern for bilateral leg pain.  Completed chart review, reviewed recent wound care notes as well as recent ER visits.  Patient has had multiple visits due to leg pain associated with chronic wounds.  Today feet are warm and well-perfused, strong dopplerable pulse in both feet.  Per review of media tab and review of recent imaging and description of wounds and wound care notes, the legs today do not appear to be acutely different.  He has an appointment with wound care tomorrow.  Given the stable appearance of his legs, do not feel he needs any medical work-up such as labs or imaging today.  Feel he is appropriate for recheck with his wound care specialist tomorrow.  Patient states that he is out of his albuterol inhaler and occasionally feels short of breath more at night time.  Endorses history of asthma/COPD.  Currently his lungs are clear he is not tachypneic and is not hypoxic.  Will provide inhaler here and dose of pain medicine.  I stressed need for him to go to his appointment tomorrow.        Final Clinical Impression(s) / ED  Diagnoses Final diagnoses:  Other chronic pain  Pain in both lower extremities  Chronic ulcer of right ankle, unspecified ulcer stage [facility])    Rx / DC Orders ED Discharge Orders     None         Lucrezia Starch, MD 01/21/22 1734

## 2022-01-21 NOTE — ED Notes (Signed)
Pt provided x1 bus pass. 

## 2022-01-22 ENCOUNTER — Other Ambulatory Visit: Payer: Self-pay

## 2022-01-22 ENCOUNTER — Encounter (HOSPITAL_BASED_OUTPATIENT_CLINIC_OR_DEPARTMENT_OTHER): Payer: Medicaid Other | Admitting: Physician Assistant

## 2022-01-22 DIAGNOSIS — E11621 Type 2 diabetes mellitus with foot ulcer: Secondary | ICD-10-CM | POA: Diagnosis not present

## 2022-01-22 NOTE — Progress Notes (Addendum)
[name], [name] ([id number]) Visit Report for 01/22/2022 Chief Complaint Document Details Patient Name: Date of Service: [name], [place] [name] 01/22/2022 11:00 A M Medical Record Number: [id number] Patient Account Number: [id number] Date of Birth/Sex: Treating RN: 1966-11-20 (56 y.o. M) Primary Care Provider: PCP, NO Other Clinician: Referring Provider: Treating Provider/Extender: Skeet Simmer in Treatment: 3 Information Obtained from: Patient Chief Complaint Bilateral LE Ulcers Electronic Signature(s) Signed: 01/22/2022 11:30:53 AM By: Lenda Kelp PA-C Entered By: Lenda Kelp on 01/22/2022 11:30:53 -------------------------------------------------------------------------------- HPI Details Patient Name: Date of Service: [name], [name] 01/22/2022 11:00 A M Medical Record Number: [id number] Patient Account Number: [id number] Date of Birth/Sex: Treating RN: June 15, 1966 (56 y.o. M) Primary Care Provider: PCP, NO Other Clinician: Referring Provider: Treating Provider/Extender: Skeet Simmer in Treatment: 3 History of Present Illness HPI Description: 01/01/2022 upon evaluation today patient is a pleasant gentleman who presents today for evaluation here in the clinic initially. Subsequently he is having a lot of issues here with wounds on his bilateral lower extremities that appear to be for the most part venous in nature. I do not see any signs of infection currently although he has had cellulitis a couple times. He does have several contributing factors to this he has good arterial flow I did review his arterial study which showed an ABI on the right of 1.03 and on the left of 1.02 this was May 2022. Previous that his right ABI was 1.15 and left 1.07. He seems to have good blood flow today as well and I see no changes or shifts here to be perfectly honest. That is good news. Nonetheless he does have some chronic medical conditions that are affecting the situation namely his  congestive heart failure I will detail this right below. Patient does have a history of diabetes mellitus type 2 although his A1c is 5.8 on most recent check and seems to be doing okay he has not taken any medication at this point though previously he was on metformin. He also has a history of congestive heart failure with ejection fraction of 25%, COPD, and hypertension. Of note also the patient is currently homeless he is sleeping outside in a sleeping bag. This is definitely not a common complaint as far as his treatment plan is concerned. He did have Medicaid in [place] but recently moved to [place] area. His Medicaid has not flipped over yet although he does have a Child psychotherapist with him today who is working on that aspect. 01/08/2022 upon evaluation today patient appears to be doing decently well in regard to his wound. He has been tolerating the dressing changes without complication. He did have to go to the hospital to have his wraps removed last week as a starting to hurt I think it may have been that the dressings were getting somewhat dry. We will see what I can do today to try to prevent that from happening. 01/22/2022 upon evaluation today patient appears to be doing much better in regard to his wounds. He is actually making excellent progress. Fortunately I do not see any signs of active infection locally or systemically which is great news. No fevers, chills, nausea, vomiting, or diarrhea. Electronic Signature(s) Signed: 01/22/2022 11:50:41 AM By: Lenda Kelp PA-C Entered By: Lenda Kelp on 01/22/2022 11:50:40 -------------------------------------------------------------------------------- Physical Exam Details Patient Name: Date of Service: [facility] [name], [name] 01/22/2022 11:00 A M Medical Record Number: [id number] Patient Account Number: [id number] Date of Birth/Sex: Treating RN: 02-17-1966 (  56 y.o. M) Primary Care Provider: PCP, NO Other Clinician: Referring  Provider: Treating Provider/Extender: Skeet Simmer in Treatment: 3 Constitutional Well-nourished and well-hydrated in no acute distress. Respiratory normal breathing without difficulty. Psychiatric this patient is able to make decisions and demonstrates good insight into disease process. Alert and Oriented x 3. pleasant and cooperative. Notes Upon inspection patient's wound bed actually showed evidence of good granulation and epithelization at this point. I feel like that the wound proximally in the medial calf region is almost completely healed and the wound distally around the ankle is significantly smaller. Unfortunately I think this is not completely done yet but I do feel like it is much better than where we have been in the past. Electronic Signature(s) Signed: 01/22/2022 11:51:09 AM By: Lenda Kelp PA-C Entered By: Lenda Kelp on 01/22/2022 11:51:09 -------------------------------------------------------------------------------- Physician Orders Details Patient Name: Date of Service: [facility] [name], [name] 01/22/2022 11:00 A M Medical Record Number: [id number] Patient Account Number: [id number] Date of Birth/Sex: Treating RN: 1965-12-07 (56 y.o. 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 - Wednesday with Leonard Schwartz, PA Return A Other: - Pain clinic referral resent. Someone should call you. ***Patient to purchase compression stockings. leg measurements provided. bring in next week.*** Bathing/ Shower/ Hygiene May shower with protection but do not get wound dressing(s) wet. Edema Control - Lymphedema / SCD / Other Elevate legs to the level of the heart or above for 30 minutes daily and/or when sitting, a frequency of: - 3-4 times a day throughout the day. Avoid standing for long periods of time. Exercise regularly Other Edema Control Orders/Instructions: - apply lotion with TCA cream, kerlix and ace wrap to left foot and leg. Additional Orders / Instructions Stop/Decrease Smoking Follow Nutritious Diet Wound Treatment Wound #1 - Lower Leg Wound Laterality: Right, Posterior Cleanser: Soap and Water 1 x Per Week/30 Days Discharge Instructions: May shower and wash wound with dial antibacterial soap and water prior to dressing change. Cleanser: Wound Cleanser 1 x Per Week/30 Days Discharge Instructions: Cleanse the wound with wound cleanser prior to applying a clean dressing using gauze sponges, not tissue or cotton balls. Peri-Wound Care: Triamcinolone 15 (g) 1 x Per Week/30 Days Discharge Instructions: Use triamcinolone 15 (g) as directed Peri-Wound Care: Sween Lotion (Moisturizing lotion) 1 x Per Week/30 Days Discharge Instructions: Apply moisturizing lotion as directed Topical: Triamcinolone 1 x Per Week/30 Days Discharge Instructions: Apply Triamcinolone as directed Prim Dressing: Xeroform Occlusive Gauze Dressing, 4x4 in 1 x Per Week/30 Days ary Discharge Instructions: Apply to wound bed as instructed Secondary Dressing: ABD Pad, 8x10 1 x Per Week/30 Days Discharge Instructions: Apply over primary dressing as directed. Compression Wrap: Kerlix Roll 4.5x3.1 (in/yd) 1 x Per Week/30 Days Discharge Instructions: Apply Kerlix and Coban  compression as directed. Compression Wrap: Coban Self-Adherent Wrap 4x5 (in/yd) 1 x Per Week/30 Days Discharge Instructions: Apply over Kerlix as directed. Wound #2 -  Ankle Wound Laterality: Right, Medial Cleanser: Soap and Water 1 x Per Week/30 Days Discharge Instructions: May shower and wash wound with dial antibacterial soap and water prior to dressing change. Cleanser: Wound Cleanser 1 x Per Week/30 Days Discharge Instructions: Cleanse the wound with wound cleanser prior to applying a clean dressing using gauze sponges, not tissue or cotton balls. Peri-Wound Care: Triamcinolone 15 (g) 1 x Per Week/30 Days Discharge Instructions: Use triamcinolone 15 (g) as directed Peri-Wound Care: Sween Lotion (Moisturizing lotion) 1 x Per Week/30 Days Discharge Instructions: Apply moisturizing lotion as directed Topical: Triamcinolone 1 x Per Week/30 Days Discharge Instructions: Apply Triamcinolone as directed Prim Dressing: Xeroform Occlusive Gauze Dressing, 4x4 in 1 x Per Week/30 Days ary Discharge Instructions: Apply to wound bed as instructed Secondary Dressing: ABD Pad, 8x10 1 x Per Week/30 Days Discharge Instructions: Apply over primary dressing as directed. Compression Wrap: Kerlix Roll 4.5x3.1 (in/yd) 1 x Per Week/30 Days Discharge Instructions: Apply Kerlix and Coban compression as directed. Compression Wrap: Coban Self-Adherent Wrap 4x5 (in/yd) 1 x Per Week/30 Days Discharge Instructions: Apply over Kerlix as directed. Electronic Signature(s) Signed: 01/22/2022 4:57:32 PM By: Lenda Kelp PA-C Signed: 01/22/2022 5:17:37 PM By: Zenaida Deed RN, BSN Entered By: Zenaida Deed on 01/22/2022 11:40:48 -------------------------------------------------------------------------------- Problem List Details Patient Name: Date of Service: [facility] [name], [name] 01/22/2022 11:00 A M Medical Record Number: [id number] Patient Account Number: [id number] Date of Birth/Sex: Treating RN: 04-21-1966 (56  y.o. M) Primary Care Provider: PCP, NO Other Clinician: Referring Provider: Treating Provider/Extender: Skeet Simmer in Treatment: 3 Active Problems ICD-10 Encounter Code Description Active Date MDM Diagnosis E11.621 Type 2 diabetes mellitus with foot ulcer 01/01/2022 No Yes L97.512 Non-pressure chronic ulcer of other part of right foot with fat layer exposed 01/01/2022 No Yes L97.312 Non-pressure chronic ulcer of right ankle with fat layer exposed 01/01/2022 No Yes L97.822 Non-pressure chronic ulcer of other part of left lower leg with fat layer exposed2/12/2021 No Yes I50.42 Chronic combined systolic (congestive) and diastolic (congestive) heart failure 01/01/2022 No Yes J44.9 Chronic obstructive pulmonary disease, unspecified 01/01/2022 No Yes I10 Essential (primary) hypertension 01/01/2022 No Yes Inactive Problems Resolved Problems Electronic Signature(s) Signed: 01/22/2022 11:30:40 AM By: Lenda Kelp PA-C Entered By: Lenda Kelp on 01/22/2022 11:30:40 -------------------------------------------------------------------------------- Progress Note Details Patient Name: Date of Service: [facility] [name], [name] 01/22/2022 11:00 A M Medical Record Number: [id number] Patient Account Number: [id number] Date of Birth/Sex: Treating RN: [birth year] (56 y.o. M) Primary Care Provider: PCP, NO Other Clinician: Referring Provider: Treating Provider/Extender: Skeet Simmer in Treatment: 3 Subjective Chief Complaint Information obtained from Patient Bilateral LE Ulcers History of Present Illness (HPI) 01/01/2022 upon evaluation today patient is a pleasant gentleman who presents today for evaluation here in the clinic initially. Subsequently he is having a lot of issues here with wounds on his bilateral lower extremities that appear to be for the most part venous in nature. I do not see any signs of infection currently although he has had cellulitis a couple times. He does have several  contributing factors to this he has good arterial flow I did review his arterial study which showed an ABI on the right of 1.03 and on the left of 1.02 this was May 2022. Previous that his right ABI was 1.15 and left 1.07. He seems to have good blood flow today as well and I see no changes or shifts here to be perfectly honest. That is good news. Nonetheless he does have some  chronic medical conditions that are affecting the situation namely his congestive heart failure I will detail this right below. Patient does have a history of diabetes mellitus type 2 although his A1c is 5.8 on most recent check and seems to be doing okay he has not taken any medication at this point though previously he was on metformin. He also has a history of congestive heart failure with ejection fraction of 25%, COPD, and hypertension. Of note also the patient is currently homeless he is sleeping outside in a sleeping bag. This is definitely not a common complaint as far as his treatment plan is concerned. He did have Medicaid in [place] but recently moved to [place] area. His Medicaid has not flipped over yet although he does have a Child psychotherapist with him today who is working on that aspect. 01/08/2022 upon evaluation today patient appears to be doing decently well in regard to his wound. He has been tolerating the dressing changes without complication. He did have to go to the hospital to have his wraps removed last week as a starting to hurt I think it may have been that the dressings were getting somewhat dry. We will see what I can do today to try to prevent that from happening. 01/22/2022 upon evaluation today patient appears to be doing much better in regard to his wounds. He is actually making excellent progress. Fortunately I do not see any signs of active infection locally or systemically which is great news. No fevers, chills, nausea, vomiting, or diarrhea. Objective Constitutional Well-nourished and  well-hydrated in no acute distress. Vitals Time Taken: 11:05 AM, Height: 69 in, Weight: 195 lbs, BMI: 28.8, Temperature: 98.2 F, Pulse: 102 bpm, Respiratory Rate: 20 breaths/min, Blood Pressure: 158/103 mmHg, Capillary Blood Glucose: 171 mg/dl. General Notes: glucose per pt report yesterday Respiratory normal breathing without difficulty. Psychiatric this patient is able to make decisions and demonstrates good insight into disease process. Alert and Oriented x 3. pleasant and cooperative. General Notes: Upon inspection patient's wound bed actually showed evidence of good granulation and epithelization at this point. I feel like that the wound proximally in the medial calf region is almost completely healed and the wound distally around the ankle is significantly smaller. Unfortunately I think this is not completely done yet but I do feel like it is much better than where we have been in the past. Integumentary (Hair, Skin) Wound #1 status is Open. Original cause of wound was Gradually Appeared. The date acquired was: 08/31/2021. The wound has been in treatment 3 weeks. The wound is located on the Right,Posterior Lower Leg. The wound measures 0.5cm length x 0.5cm width x 0.1cm depth; 0.196cm^2 area and 0.02cm^3 volume. There is Fat Layer (Subcutaneous Tissue) exposed. There is no tunneling or undermining noted. There is a small amount of serosanguineous drainage noted. The wound margin is flat and intact. There is no granulation within the wound bed. There is no necrotic tissue within the wound bed. Wound #2 status is Open. Original cause of wound was Gradually Appeared. The date acquired was: 08/31/2021. The wound has been in treatment 3 weeks. The wound is located on the Right,Medial Ankle. The wound measures 2.6cm length x 3cm width x 0.1cm depth; 6.126cm^2 area and 0.613cm^3 volume. There is Fat Layer (Subcutaneous Tissue) exposed. There is no tunneling or undermining noted. There is a medium  amount of serosanguineous drainage noted. The wound margin is flat and intact. There is no granulation within the wound bed. There is  a large (67-100%) amount of necrotic tissue within the wound bed including Eschar and Adherent Slough. 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 - Wednesday with Leonard Schwartz, PA Other: - Pain clinic referral resent. Someone should call you. ***Patient to purchase compression stockings. leg measurements provided. bring in next week.*** Bathing/ Shower/ Hygiene: May shower with protection but do not get wound dressing(s) wet. Edema Control - Lymphedema / SCD / Other: Elevate legs to the level of the heart or above for 30 minutes daily and/or when sitting, a frequency of: - 3-4 times a day throughout the day. Avoid standing for long periods of time. Exercise regularly Other Edema Control Orders/Instructions: - apply lotion with TCA cream, kerlix and ace wrap to left foot and leg. Additional Orders / Instructions: Stop/Decrease Smoking Follow Nutritious Diet WOUND #1: - Lower Leg Wound Laterality: Right, Posterior Cleanser: Soap and Water 1 x Per Week/30 Days Discharge Instructions: May shower and wash wound with dial antibacterial soap and water prior to dressing change. Cleanser: Wound Cleanser 1 x Per Week/30 Days Discharge Instructions: Cleanse the wound with wound cleanser prior to applying a clean dressing using gauze sponges, not tissue or cotton balls. Peri-Wound Care: Triamcinolone  15 (g) 1 x Per Week/30 Days Discharge Instructions: Use triamcinolone 15 (g) as directed Peri-Wound Care: Sween Lotion (Moisturizing lotion) 1 x Per Week/30 Days Discharge Instructions: Apply moisturizing lotion as directed Topical: Triamcinolone 1 x Per Week/30 Days Discharge Instructions: Apply Triamcinolone as directed Prim Dressing: Xeroform Occlusive Gauze Dressing, 4x4 in 1 x Per Week/30 Days ary Discharge Instructions: Apply to wound bed as instructed Secondary Dressing: ABD Pad, 8x10 1 x Per Week/30 Days Discharge Instructions: Apply over primary dressing as directed. Com pression Wrap: Kerlix Roll 4.5x3.1 (in/yd) 1 x Per Week/30 Days Discharge Instructions: Apply Kerlix and Coban compression as directed. Com pression Wrap: Coban Self-Adherent Wrap 4x5 (in/yd) 1 x Per Week/30 Days Discharge Instructions: Apply over Kerlix as directed. WOUND #2: - Ankle Wound Laterality: Right, Medial Cleanser: Soap and Water 1 x Per Week/30 Days Discharge Instructions: May shower and wash wound with dial antibacterial soap and water prior to dressing change. Cleanser: Wound Cleanser 1 x Per Week/30 Days Discharge Instructions: Cleanse the wound with wound cleanser prior to applying a clean dressing using gauze sponges, not tissue or cotton balls. Peri-Wound Care: Triamcinolone 15 (g) 1 x Per Week/30 Days Discharge Instructions: Use triamcinolone 15 (g) as directed Peri-Wound Care: Sween Lotion (Moisturizing lotion) 1 x Per Week/30 Days Discharge Instructions: Apply moisturizing lotion as directed Topical: Triamcinolone 1 x Per Week/30 Days Discharge Instructions: Apply Triamcinolone as directed Prim Dressing: Xeroform Occlusive Gauze Dressing, 4x4 in 1 x Per Week/30 Days ary Discharge Instructions: Apply to wound bed as instructed Secondary Dressing: ABD Pad, 8x10 1 x Per Week/30 Days Discharge Instructions: Apply over primary dressing as directed. Com pression Wrap: Kerlix Roll 4.5x3.1 (in/yd)  1 x Per Week/30 Days Discharge Instructions: Apply Kerlix and Coban compression as directed. Com pression Wrap: Coban Self-Adherent Wrap 4x5 (in/yd) 1 x Per Week/30 Days Discharge Instructions: Apply over Kerlix as directed. 1. Would recommend currently that we  go ahead and continue with the wound care measures as before. We are using the triamcinolone followed by the Xeroform gauze which I think is doing a good job currently. 2. Also can recommend that we have the patient continue with the ABD pad to cover which I think is perfect. 3. I am also going to suggest that we continue with the Curlex and Coban wrap I think this is going to stay in place better than the Ace wrap which came off and he did not attempt to replace this. Nonetheless I think that still the Coban is going to hold much more securely and will overall do better for him. We will see patient back for reevaluation in 1 week here in the clinic. If anything worsens or changes patient will contact our office for additional recommendations. Electronic Signature(s) Signed: 01/22/2022 11:51:51 AM By: Lenda Kelp PA-C Entered By: Lenda Kelp on 01/22/2022 11:51:51 -------------------------------------------------------------------------------- SuperBill Details Patient Name: Date of Service: [facility] [name], [name] 01/22/2022 Medical Record Number: [id number] Patient Account Number: [id number] Date of Birth/Sex: Treating RN: November 14, 1966 (56 y.o. 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 L97.822 Non-pressure chronic ulcer of other part of left lower leg with fat layer  exposed I50.42 Chronic combined systolic (congestive) and diastolic (congestive) heart failure J44.9 Chronic obstructive pulmonary disease, unspecified I10 Essential (primary) hypertension Facility Procedures CPT4 Code: [id number] Description: 99214 - WOUND CARE VISIT-LEV 4 EST PT Modifier: Quantity: 1 Physician Procedures : CPT4 Code Description Modifier [id number] - WC PHYS LEVEL 4 - EST PT ICD-10 Diagnosis Description E11.621 Type 2 diabetes mellitus with foot ulcer L97.512 Non-pressure chronic ulcer of other part of right foot with fat layer exposed L97.312  Non-pressure chronic ulcer of right ankle with fat layer exposed L97.822 Non-pressure chronic ulcer of other part of left lower leg with fat layer exposed Quantity: 1 Electronic Signature(s) Signed: 01/22/2022 11:53:48 AM By: Lenda Kelp PA-C Entered By: Lenda Kelp on 01/22/2022 11:53:47

## 2022-01-23 NOTE — Progress Notes (Signed)
[name], [name] ([id number]) Visit Report for 01/22/2022 Arrival Information Details Patient Name: Date of Service: [facility] [place], [name] 01/22/2022 11:00 A M Medical Record Number: [id number] Patient Account Number: [id number] Date of Birth/Sex: Treating RN: [birth year] (56 y.o. [name] Primary Care Reuel Lamadrid: PCP, NO Other Clinician: Referring Matha Masse: Treating Kahealani Yankovich/Extender: Sharalyn Ink in Treatment: 3 Visit Information History Since Last Visit Has Dressing in Place as Prescribed: No Patient Arrived: Wheel Chair Has Compression in Place as Prescribed: No Arrival Time: 11:04 Pain Present Now: Yes Accompanied By: self Transfer Assistance: None Patient Identification Verified: Yes Secondary Verification Process Completed: Yes Patient Requires Transmission-Based No Precautions: Patient Has Alerts: Yes Patient Alerts: 04/23/21 ABI: L 1.02 R1.03 Notes pt went to ED to remove wraps secondary to pain Electronic Signature(s) Signed: 01/22/2022 5:17:37 PM By: Baruch Gouty RN, BSN Entered By: Baruch Gouty on 01/22/2022 11:05:09 -------------------------------------------------------------------------------- Clinic Level of Care Assessment Details Patient Name: Date of Service: [facility] [place], [place] 01/22/2022 11:00 A M Medical Record Number: [id number] Patient Account Number: [id number] Date of Birth/Sex: Treating RN: May 07, 1966 (56 y.o. [name] Primary Care Enisa Runyan: PCP, NO Other Clinician: Referring Dougles Kimmey: Treating Adelaine Roppolo/Extender: Sharalyn Ink in Treatment: 3 Clinic Level of Care Assessment Items TOOL 4 Quantity Score _0  - 0 Use when only an EandM is performed on FOLLOW-UP visit ASSESSMENTS - Nursing Assessment / Reassessment X- 1 10 Reassessment of Co-morbidities (includes updates in patient status) X- 1 5 Reassessment of Adherence to Treatment Plan ASSESSMENTS - Wound and Skin A ssessment / Reassessment _1  - 0 Simple Wound  Assessment / Reassessment - one wound X- 2 5 Complex Wound Assessment / Reassessment - multiple wounds _2  - 0 Dermatologic / Skin Assessment (not related to wound area) ASSESSMENTS - Focused Assessment X- 1 5 Circumferential Edema Measurements - multi extremities _3  - 0 Nutritional Assessment / Counseling / Intervention X- 1 5 Lower Extremity Assessment (monofilament, tuning fork, pulses) _4  - 0 Peripheral Arterial Disease Assessment (using hand held doppler) ASSESSMENTS - Ostomy and/or Continence Assessment and Care _5  - 0 Incontinence Assessment and Management _6  - 0 Ostomy Care Assessment and Management (repouching, etc.) PROCESS - Coordination of Care X - Simple Patient / Family Education for ongoing care 1 15 _7  - 0 Complex (extensive) Patient / Family Education for ongoing care X- 1 10 Staff obtains Programmer, systems, Records, T Results / Process Orders est _8  - 0 Staff telephones HHA, Nursing Homes / Clarify orders / etc _9  - 0 Routine Transfer to another Facility (non-emergent condition) _10  - 0 Routine Hospital Admission (non-emergent condition) _11  - 0 New Admissions / Biomedical engineer / Ordering NPWT Apligraf, etc. , _12  - 0 Emergency Hospital Admission (emergent condition) X- 1 10 Simple Discharge Coordination _13  - 0 Complex (extensive) Discharge Coordination PROCESS - Special Needs _14  - 0 Pediatric / Minor Patient Management _15  - 0 Isolation Patient Management _16  - 0 Hearing / Language / Visual special needs _17  - 0 Assessment of Community assistance (transportation, D/C planning, etc.) _18  - 0 Additional assistance / Altered mentation _19  - 0 Support Surface(s) Assessment (bed, cushion, seat, etc.) INTERVENTIONS - Wound Cleansing / Measurement _20  - 0 Simple Wound Cleansing - one wound X- 2 5 Complex Wound Cleansing - multiple wounds X- 1 5 Wound Imaging (photographs - any number of wounds) _21  - 0 Wound Tracing (instead of photographs) _22  -  0 Simple Wound Measurement - one wound X- 2 5 Complex Wound Measurement - multiple wounds INTERVENTIONS - Wound Dressings  X - Small Wound Dressing one or multiple wounds 2 10 _0  - 0 Medium Wound Dressing one or multiple wounds _1  - 0 Large Wound Dressing one or multiple wounds <HCWCBJSEGBTDVVOH>_6<\/WVPXTGGYIRSWNIOE>_7  - 0 Application of Medications - topical <OJJKKXFGHWEXHBZJ>_6<\/RCVELFYBOFBPZWCH>_8  - 0 Application of Medications - injection INTERVENTIONS - Miscellaneous _4  - 0 External ear exam _5  - 0 Specimen Collection (cultures, biopsies, blood, body fluids, etc.) _6  - 0 Specimen(s) / Culture(s) sent or taken to Lab for analysis _7  - 0 Patient Transfer (multiple staff / Harrel Lemon Lift / Similar devices) _8  - 0 Simple Staple / Suture removal (25 or less) _9  - 0 Complex Staple / Suture removal (26 or more) _10  - 0 Hypo / Hyperglycemic Management (close monitor of Blood Glucose) _11  - 0 Ankle / Brachial Index (ABI) - do not check if billed separately X- 1 5 Vital Signs Has the patient been seen at the hospital within the last three years: Yes Total Score: 120 Level Of Care: New/Established - Level 4 Electronic Signature(s) Signed: 01/22/2022 5:17:37 PM By: Baruch Gouty RN, BSN Entered By: Baruch Gouty on 01/22/2022 11:35:23 -------------------------------------------------------------------------------- Encounter Discharge Information Details Patient Name: Date of Service: [facility] [place], [place] 01/22/2022 11:00 A M Medical Record Number: [id number] Patient Account Number: [id number] Date of Birth/Sex: Treating RN: 01/13/1966 (55 y.o. [name] Primary Care Thaddeus Evitts: PCP, NO Other Clinician: Referring Keano Guggenheim: Treating Tadao Emig/Extender: Sharalyn Ink in Treatment: 3 Encounter Discharge Information Items Discharge Condition: Stable Ambulatory Status: Wheelchair Discharge Destination: Home Transportation: Other Accompanied By: self Schedule Follow-up Appointment: Yes Clinical Summary of Care: Patient  Declined Notes transportation service Electronic Signature(s) Signed: 01/22/2022 5:17:37 PM By: Baruch Gouty RN, BSN Entered By: Baruch Gouty on 01/22/2022 11:58:18 -------------------------------------------------------------------------------- Lower Extremity Assessment Details Patient Name: Date of Service: [facility] [place], [place] 01/22/2022 11:00 A M Medical Record Number: [id number] Patient Account Number: [id number] Date of Birth/Sex: Treating RN: [birth year] (56 y.o. [name] Primary Care Dejon Lukas: PCP, NO Other Clinician: Referring Colby Reels: Treating Daxtin Leiker/Extender: Sharalyn Ink in Treatment: 3 Edema Assessment Assessed: [Left: No] [Right: No] Edema: [Left: Ye] [Right: s] Calf Left: Right: Point of Measurement: 36 cm From Medial Instep 37.5 cm Ankle Left: Right: Point of Measurement: 13 cm From Medial Instep 24.5 cm Vascular Assessment Pulses: Dorsalis Pedis Palpable: [Right:Yes] Electronic Signature(s) Signed: 01/22/2022 5:17:37 PM By: Baruch Gouty RN, BSN Entered By: Baruch Gouty on 01/22/2022 11:11:01 -------------------------------------------------------------------------------- Multi-Disciplinary Care Plan Details Patient Name: Date of Service: [facility] [place], [place] 01/22/2022 11:00 A M Medical Record Number: [id number] Patient Account Number: [id number] Date of Birth/Sex: Treating RN: March 06, 1966 (56 y.o. [name] Primary Care Anders Hohmann: PCP, NO Other Clinician: Referring Ngozi Alvidrez: Treating Jobani Sabado/Extender: Sharalyn Ink in Treatment: 3 Multidisciplinary Care Plan reviewed with physician Active Inactive Pain, Acute or Chronic Nursing Diagnoses: Pain, acute or chronic: actual or potential Potential alteration in comfort, pain Goals: Patient will verbalize adequate pain control and receive pain control interventions during procedures as needed Date Initiated: 01/01/2022 Target Resolution Date: 02/06/2022 Goal Status:  Active Patient/caregiver will verbalize comfort level met Date Initiated: 01/01/2022 Target Resolution Date: 02/07/2022 Goal Status: Active Interventions: Encourage patient to take pain medications as prescribed Provide education on pain management Treatment Activities: Administer pain control measures as ordered : 01/01/2022 Notes: Wound/Skin Impairment Nursing Diagnoses: Knowledge deficit related to ulceration/compromised skin integrity Goals: Patient/caregiver will verbalize understanding of skin care regimen Date Initiated: 01/01/2022 Target Resolution Date: 02/06/2022 Goal Status: Active Interventions: Assess patient/caregiver ability to obtain necessary supplies Assess  patient/caregiver ability to perform ulcer/skin care regimen upon admission and as needed Provide education on ulcer and skin care Treatment Activities: Skin care regimen initiated : 01/01/2022 Topical wound management initiated : 01/01/2022 Notes: Electronic Signature(s) Signed: 01/22/2022 5:17:37 PM By: Baruch Gouty RN, BSN Entered By: Baruch Gouty on 01/22/2022 11:16:12 -------------------------------------------------------------------------------- Pain Assessment Details Patient Name: Date of Service: [name] [place] 01/22/2022 11:00 A M Medical Record Number: [id number] Patient Account Number: [id number] Date of Birth/Sex: Treating RN: 08-Sep-1966 (56 y.o. [name] Primary Care Johnanna Bakke: PCP, NO Other Clinician: Referring Belina Mandile: Treating Zamirah Denny/Extender: Sharalyn Ink in Treatment: 3 Active Problems Location of Pain Severity and Description of Pain Patient Has Paino Yes Site Locations Pain Location: Pain in Ulcers With Dressing Change: Yes Duration of the Pain. Constant / Intermittento Constant Rate the pain. Current Pain Level: 10 Character of Pain Describe the Pain: Aching, Sharp, Throbbing Pain Management and Medication Current Pain Management: Medication: Yes Is  the Current Pain Management Adequate: Inadequate How does your wound impact your activities of daily livingo Sleep: Yes Bathing: No Appetite: No Relationship With Others: No Bladder Continence: No Emotions: Yes Bowel Continence: No Hobbies: No Toileting: No Dressing: No Electronic Signature(s) Signed: 01/22/2022 5:17:37 PM By: Baruch Gouty RN, BSN Entered By: Baruch Gouty on 01/22/2022 11:08:03 -------------------------------------------------------------------------------- Patient/Caregiver Education Details Patient Name: Date of Service: [name], [place] 2/22/2023andnbsp11:00 [place] Record Number: [id number] Patient Account Number: [id number] Date of Birth/Gender: Treating RN: 06/04/1966 (56 y.o. [name] Primary Care Physician: PCP, NO Other Clinician: Referring Physician: Treating Physician/Extender: Sharalyn Ink in Treatment: 3 Education Assessment Education Provided To: Patient Education Topics Provided Peripheral Neuropathy: Methods: Explain/Verbal Responses: Reinforcements needed, State content correctly Wound/Skin Impairment: Methods: Explain/Verbal Responses: Reinforcements needed, State content correctly Electronic Signature(s) Signed: 01/22/2022 5:17:37 PM By: Baruch Gouty RN, BSN Entered By: Baruch Gouty on 01/22/2022 11:16:39 -------------------------------------------------------------------------------- Wound Assessment Details Patient Name: Date of Service: [facility] [place], [place] 01/22/2022 11:00 A M Medical Record Number: [id number] Patient Account Number: [id number] Date of Birth/Sex: Treating RN: [birth year] (56 y.o. [name] Primary Care Zeenat Jeanbaptiste: PCP, NO Other Clinician: Referring Oluwadara Gorman: Treating Gari Hartsell/Extender: Sharalyn Ink in Treatment: 3 Wound Status Wound Number: 1 Primary Diabetic Wound/Ulcer of the Lower Extremity Etiology: Wound Location: Right, Posterior Lower Leg Secondary  Lymphedema Wounding Event: Gradually Appeared Etiology: Date Acquired: 08/31/2021 Wound Open Weeks Of Treatment: 3 Status: Clustered Wound: No Comorbid Chronic Obstructive Pulmonary Disease (COPD), Congestive History: Heart Failure, Hypertension, Type II Diabetes Photos Wound Measurements Length: (cm) 0.5 Width: (cm) 0.5 Depth: (cm) 0.1 Area: (cm) 0.196 Volume: (cm) 0.02 % Reduction in Area: 94.5% % Reduction in Volume: 94.3% Epithelialization: Large (67-100%) Tunneling: No Undermining: No Wound Description Classification: Grade 1 Wound Margin: Flat and Intact Exudate Amount: Small Exudate Type: Serosanguineous Exudate Color: red, brown Foul Odor After Cleansing: No Slough/Fibrino No Wound Bed Granulation Amount: None Present (0%) Exposed Structure Necrotic Amount: None Present (0%) Fascia Exposed: No Fat Layer (Subcutaneous Tissue) Exposed: Yes Tendon Exposed: No Muscle Exposed: No Joint Exposed: No Bone Exposed: No Treatment Notes Wound #1 (Lower Leg) Wound Laterality: Right, Posterior Cleanser Soap and Water Discharge Instruction: May shower and wash wound with dial antibacterial soap and water prior to dressing change. Wound Cleanser Discharge Instruction: Cleanse the wound with wound cleanser prior to applying a clean dressing using gauze sponges, not tissue or cotton balls. Peri-Wound Care Triamcinolone 15 (g) Discharge Instruction: Use triamcinolone 15 (g) as directed Sween Lotion (Moisturizing  lotion) Discharge Instruction: Apply moisturizing lotion as directed Topical Triamcinolone Discharge Instruction: Apply Triamcinolone as directed Primary Dressing Xeroform Occlusive Gauze Dressing, 4x4 in Discharge Instruction: Apply to wound bed as instructed Secondary Dressing ABD Pad, 8x10 Discharge Instruction: Apply over primary dressing as directed. Secured With Compression Wrap Kerlix Roll 4.5x3.1 (in/yd) Discharge Instruction: Apply Kerlix and  Coban compression as directed. Coban Self-Adherent Wrap 4x5 (in/yd) Discharge Instruction: Apply over Kerlix as directed. Compression Stockings Add-Ons Electronic Signature(s) Signed: 01/22/2022 5:17:37 PM By: Baruch Gouty RN, BSN Signed: 01/23/2022 2:56:00 PM By: Sandre Kitty Entered By: Sandre Kitty on 01/22/2022 11:14:36 -------------------------------------------------------------------------------- Wound Assessment Details Patient Name: Date of Service: [facility] [place], [place] 01/22/2022 11:00 A M Medical Record Number: [id number] Patient Account Number: [id number] Date of Birth/Sex: Treating RN: December 25, 1965 (56 y.o. [name] Primary Care Dohn Stclair: PCP, NO Other Clinician: Referring Evanee Lubrano: Treating Lianni Kanaan/Extender: Sharalyn Ink in Treatment: 3 Wound Status Wound Number: 2 Primary Diabetic Wound/Ulcer of the Lower Extremity Etiology: Wound Location: Right, Medial Ankle Secondary Venous Leg Ulcer Wounding Event: Gradually Appeared Etiology: Date Acquired: 08/31/2021 Wound Open Weeks Of Treatment: 3 Status: Clustered Wound: No Comorbid Chronic Obstructive Pulmonary Disease (COPD), Congestive History: Heart Failure, Hypertension, Type II Diabetes Photos Wound Measurements Length: (cm) 2.6 Width: (cm) 3 Depth: (cm) 0.1 Area: (cm) 6.126 Volume: (cm) 0.613 % Reduction in Area: 36.3% % Reduction in Volume: 36.3% Epithelialization: Small (1-33%) Tunneling: No Undermining: No Wound Description Classification: Grade 2 Wound Margin: Flat and Intact Exudate Amount: Medium Exudate Type: Serosanguineous Exudate Color: red, brown Foul Odor After Cleansing: No Slough/Fibrino No Wound Bed Granulation Amount: None Present (0%) Exposed Structure Necrotic Amount: Large (67-100%) Fascia Exposed: No Necrotic Quality: Eschar, Adherent Slough Fat Layer (Subcutaneous Tissue) Exposed: Yes Tendon Exposed: No Muscle Exposed: No Joint Exposed: No Bone  Exposed: No Treatment Notes Wound #2 (Ankle) Wound Laterality: Right, Medial Cleanser Soap and Water Discharge Instruction: May shower and wash wound with dial antibacterial soap and water prior to dressing change. Wound Cleanser Discharge Instruction: Cleanse the wound with wound cleanser prior to applying a clean dressing using gauze sponges, not tissue or cotton balls. Peri-Wound Care Triamcinolone 15 (g) Discharge Instruction: Use triamcinolone 15 (g) as directed Sween Lotion (Moisturizing lotion) Discharge Instruction: Apply moisturizing lotion as directed Topical Triamcinolone Discharge Instruction: Apply Triamcinolone as directed Primary Dressing Xeroform Occlusive Gauze Dressing, 4x4 in Discharge Instruction: Apply to wound bed as instructed Secondary Dressing ABD Pad, 8x10 Discharge Instruction: Apply over primary dressing as directed. Secured With Compression Wrap Kerlix Roll 4.5x3.1 (in/yd) Discharge Instruction: Apply Kerlix and Coban compression as directed. Coban Self-Adherent Wrap 4x5 (in/yd) Discharge Instruction: Apply over Kerlix as directed. Compression Stockings Add-Ons Electronic Signature(s) Signed: 01/22/2022 5:17:37 PM By: Baruch Gouty RN, BSN Signed: 01/23/2022 2:56:00 PM By: Sandre Kitty Entered By: Sandre Kitty on 01/22/2022 11:16:27 -------------------------------------------------------------------------------- Vitals Details Patient Name: Date of Service: [facility] [place], [place] 01/22/2022 11:00 A M Medical Record Number: [id number] Patient Account Number: [id number] Date of Birth/Sex: Treating RN: 1966-06-30 (56 y.o. [name] Primary Care Catina Nuss: PCP, NO Other Clinician: Referring Bentzion Dauria: Treating Arya Luttrull/Extender: Sharalyn Ink in Treatment: 3 Vital Signs Time Taken: 11:05 Temperature (F): 98.2 Height (in): 69 Pulse (bpm): 102 Weight (lbs): 195 Respiratory Rate (breaths/min): 20 Body Mass Index (BMI):  28.8 Blood Pressure (mmHg): 158/103 Capillary Blood Glucose (mg/dl): 171 Reference Range: 80 - 120 mg / dl Notes glucose per pt report yesterday Electronic Signature(s) Signed: 01/22/2022 5:17:37 PM By: Baruch Gouty RN, BSN Entered  By: Baruch Gouty on 01/22/2022 11:06:35

## 2022-01-29 ENCOUNTER — Inpatient Hospital Stay (HOSPITAL_COMMUNITY)
Admission: EM | Admit: 2022-01-29 | Discharge: 2022-02-05 | DRG: 291 | Discharge status: Against Medical Advice | Payer: Medicaid Other | Attending: Internal Medicine | Admitting: Internal Medicine

## 2022-01-29 ENCOUNTER — Emergency Department (HOSPITAL_COMMUNITY): Payer: Medicaid Other

## 2022-01-29 ENCOUNTER — Encounter (HOSPITAL_COMMUNITY): Payer: Self-pay

## 2022-01-29 ENCOUNTER — Observation Stay (HOSPITAL_COMMUNITY): Payer: Medicaid Other

## 2022-01-29 ENCOUNTER — Encounter (HOSPITAL_BASED_OUTPATIENT_CLINIC_OR_DEPARTMENT_OTHER): Payer: Medicaid Other | Admitting: Physician Assistant

## 2022-01-29 DIAGNOSIS — L97311 Non-pressure chronic ulcer of right ankle limited to breakdown of skin: Secondary | ICD-10-CM | POA: Diagnosis present

## 2022-01-29 DIAGNOSIS — E785 Hyperlipidemia, unspecified: Secondary | ICD-10-CM | POA: Diagnosis present

## 2022-01-29 DIAGNOSIS — R471 Dysarthria and anarthria: Secondary | ICD-10-CM

## 2022-01-29 DIAGNOSIS — N179 Acute kidney failure, unspecified: Secondary | ICD-10-CM | POA: Diagnosis present

## 2022-01-29 DIAGNOSIS — R0602 Shortness of breath: Secondary | ICD-10-CM

## 2022-01-29 DIAGNOSIS — Z20822 Contact with and (suspected) exposure to covid-19: Secondary | ICD-10-CM | POA: Diagnosis present

## 2022-01-29 DIAGNOSIS — J45909 Unspecified asthma, uncomplicated: Secondary | ICD-10-CM | POA: Diagnosis present

## 2022-01-29 DIAGNOSIS — F17210 Nicotine dependence, cigarettes, uncomplicated: Secondary | ICD-10-CM | POA: Diagnosis present

## 2022-01-29 DIAGNOSIS — J449 Chronic obstructive pulmonary disease, unspecified: Secondary | ICD-10-CM | POA: Diagnosis present

## 2022-01-29 DIAGNOSIS — Z993 Dependence on wheelchair: Secondary | ICD-10-CM

## 2022-01-29 DIAGNOSIS — Z888 Allergy status to other drugs, medicaments and biological substances status: Secondary | ICD-10-CM

## 2022-01-29 DIAGNOSIS — K219 Gastro-esophageal reflux disease without esophagitis: Secondary | ICD-10-CM | POA: Diagnosis present

## 2022-01-29 DIAGNOSIS — I110 Hypertensive heart disease with heart failure: Principal | ICD-10-CM | POA: Diagnosis present

## 2022-01-29 DIAGNOSIS — R9431 Abnormal electrocardiogram [ECG] [EKG]: Secondary | ICD-10-CM | POA: Diagnosis present

## 2022-01-29 DIAGNOSIS — F1010 Alcohol abuse, uncomplicated: Secondary | ICD-10-CM

## 2022-01-29 DIAGNOSIS — R531 Weakness: Secondary | ICD-10-CM

## 2022-01-29 DIAGNOSIS — I69351 Hemiplegia and hemiparesis following cerebral infarction affecting right dominant side: Secondary | ICD-10-CM

## 2022-01-29 DIAGNOSIS — T148XXA Other injury of unspecified body region, initial encounter: Secondary | ICD-10-CM

## 2022-01-29 DIAGNOSIS — R778 Other specified abnormalities of plasma proteins: Secondary | ICD-10-CM | POA: Diagnosis present

## 2022-01-29 DIAGNOSIS — Z7951 Long term (current) use of inhaled steroids: Secondary | ICD-10-CM

## 2022-01-29 DIAGNOSIS — I160 Hypertensive urgency: Secondary | ICD-10-CM | POA: Diagnosis present

## 2022-01-29 DIAGNOSIS — I5043 Acute on chronic combined systolic (congestive) and diastolic (congestive) heart failure: Secondary | ICD-10-CM | POA: Diagnosis present

## 2022-01-29 DIAGNOSIS — Z91010 Allergy to peanuts: Secondary | ICD-10-CM

## 2022-01-29 DIAGNOSIS — F17200 Nicotine dependence, unspecified, uncomplicated: Secondary | ICD-10-CM | POA: Diagnosis present

## 2022-01-29 DIAGNOSIS — Z91199 Patient's noncompliance with other medical treatment and regimen due to unspecified reason: Secondary | ICD-10-CM

## 2022-01-29 DIAGNOSIS — Z5900 Homelessness unspecified: Secondary | ICD-10-CM

## 2022-01-29 DIAGNOSIS — F1410 Cocaine abuse, uncomplicated: Secondary | ICD-10-CM | POA: Diagnosis present

## 2022-01-29 DIAGNOSIS — R7989 Other specified abnormal findings of blood chemistry: Secondary | ICD-10-CM | POA: Diagnosis present

## 2022-01-29 DIAGNOSIS — E119 Type 2 diabetes mellitus without complications: Secondary | ICD-10-CM | POA: Diagnosis present

## 2022-01-29 DIAGNOSIS — Z89421 Acquired absence of other right toe(s): Secondary | ICD-10-CM

## 2022-01-29 DIAGNOSIS — G8929 Other chronic pain: Secondary | ICD-10-CM | POA: Diagnosis present

## 2022-01-29 DIAGNOSIS — F319 Bipolar disorder, unspecified: Secondary | ICD-10-CM | POA: Diagnosis present

## 2022-01-29 DIAGNOSIS — I83013 Varicose veins of right lower extremity with ulcer of ankle: Secondary | ICD-10-CM

## 2022-01-29 DIAGNOSIS — I5042 Chronic combined systolic (congestive) and diastolic (congestive) heart failure: Secondary | ICD-10-CM | POA: Diagnosis present

## 2022-01-29 DIAGNOSIS — E1142 Type 2 diabetes mellitus with diabetic polyneuropathy: Secondary | ICD-10-CM | POA: Diagnosis present

## 2022-01-29 DIAGNOSIS — Z9114 Patient's other noncompliance with medication regimen: Secondary | ICD-10-CM

## 2022-01-29 DIAGNOSIS — Z79899 Other long term (current) drug therapy: Secondary | ICD-10-CM

## 2022-01-29 DIAGNOSIS — L97519 Non-pressure chronic ulcer of other part of right foot with unspecified severity: Secondary | ICD-10-CM

## 2022-01-29 DIAGNOSIS — I161 Hypertensive emergency: Secondary | ICD-10-CM | POA: Diagnosis present

## 2022-01-29 DIAGNOSIS — Z813 Family history of other psychoactive substance abuse and dependence: Secondary | ICD-10-CM

## 2022-01-29 DIAGNOSIS — Z886 Allergy status to analgesic agent status: Secondary | ICD-10-CM

## 2022-01-29 DIAGNOSIS — I2510 Atherosclerotic heart disease of native coronary artery without angina pectoris: Secondary | ICD-10-CM | POA: Diagnosis present

## 2022-01-29 DIAGNOSIS — Z89422 Acquired absence of other left toe(s): Secondary | ICD-10-CM

## 2022-01-29 DIAGNOSIS — E039 Hypothyroidism, unspecified: Secondary | ICD-10-CM | POA: Diagnosis present

## 2022-01-29 DIAGNOSIS — F250 Schizoaffective disorder, bipolar type: Secondary | ICD-10-CM | POA: Diagnosis present

## 2022-01-29 DIAGNOSIS — I10 Essential (primary) hypertension: Secondary | ICD-10-CM | POA: Diagnosis present

## 2022-01-29 LAB — URINALYSIS, ROUTINE W REFLEX MICROSCOPIC
Bilirubin Urine: NEGATIVE
Glucose, UA: 50 mg/dL — AB
Ketones, ur: 20 mg/dL — AB
Leukocytes,Ua: NEGATIVE
Nitrite: NEGATIVE
Protein, ur: 100 mg/dL — AB
Specific Gravity, Urine: 1.018 (ref 1.005–1.030)
pH: 5 (ref 5.0–8.0)

## 2022-01-29 LAB — COMPREHENSIVE METABOLIC PANEL
ALT: 22 U/L (ref 0–44)
AST: 28 U/L (ref 15–41)
Albumin: 3.5 g/dL (ref 3.5–5.0)
Alkaline Phosphatase: 76 U/L (ref 38–126)
Anion gap: 11 (ref 5–15)
BUN: 17 mg/dL (ref 6–20)
CO2: 21 mmol/L — ABNORMAL LOW (ref 22–32)
Calcium: 8.6 mg/dL — ABNORMAL LOW (ref 8.9–10.3)
Chloride: 105 mmol/L (ref 98–111)
Creatinine, Ser: 1.39 mg/dL — ABNORMAL HIGH (ref 0.61–1.24)
GFR, Estimated: >60 mL/min (ref >60)
Glucose, Bld: 142 mg/dL — ABNORMAL HIGH (ref 70–99)
Potassium: 4.4 mmol/L (ref 3.5–5.1)
Sodium: 137 mmol/L (ref 135–145)
Total Bilirubin: 1.1 mg/dL (ref 0.3–1.2)
Total Protein: 6.8 g/dL (ref 6.5–8.1)

## 2022-01-29 LAB — RAPID URINE DRUG SCREEN, HOSP PERFORMED
Amphetamines: NOT DETECTED
Barbiturates: NOT DETECTED
Benzodiazepines: NOT DETECTED
Cocaine: POSITIVE — AB
Opiates: NOT DETECTED
Tetrahydrocannabinol: NOT DETECTED

## 2022-01-29 LAB — CBC WITH DIFFERENTIAL/PLATELET
Abs Immature Granulocytes: 0.02 10*3/uL (ref 0.00–0.07)
Basophils Absolute: 0 10*3/uL (ref 0.0–0.1)
Basophils Relative: 0 %
Eosinophils Absolute: 0 10*3/uL (ref 0.0–0.5)
Eosinophils Relative: 0 %
HCT: 44.5 % (ref 39.0–52.0)
Hemoglobin: 14.9 g/dL (ref 13.0–17.0)
Immature Granulocytes: 0 %
Lymphocytes Relative: 16 %
Lymphs Abs: 1.3 10*3/uL (ref 0.7–4.0)
MCH: 33.2 pg (ref 26.0–34.0)
MCHC: 33.5 g/dL (ref 30.0–36.0)
MCV: 99.1 fL (ref 80.0–100.0)
Monocytes Absolute: 0.3 10*3/uL (ref 0.1–1.0)
Monocytes Relative: 4 %
Neutro Abs: 6.2 10*3/uL (ref 1.7–7.7)
Neutrophils Relative %: 80 %
Platelets: 314 10*3/uL (ref 150–400)
RBC: 4.49 MIL/uL (ref 4.22–5.81)
RDW: 15.6 % — ABNORMAL HIGH (ref 11.5–15.5)
WBC: 7.8 10*3/uL (ref 4.0–10.5)
nRBC: 0 % (ref 0.0–0.2)

## 2022-01-29 LAB — TROPONIN I (HIGH SENSITIVITY)
Troponin I (High Sensitivity): 47 ng/L — ABNORMAL HIGH (ref <18)
Troponin I (High Sensitivity): 41 ng/L — ABNORMAL HIGH (ref <18)

## 2022-01-29 LAB — PROTIME-INR
INR: 1.2 (ref 0.8–1.2)
INR: 1.2 (ref 0.8–1.2)
Prothrombin Time: 15.2 seconds (ref 11.4–15.2)
Prothrombin Time: 15.7 seconds — ABNORMAL HIGH (ref 11.4–15.2)

## 2022-01-29 LAB — LACTIC ACID, PLASMA
Lactic Acid, Venous: 2.6 mmol/L (ref 0.5–1.9)
Lactic Acid, Venous: 1.7 mmol/L (ref 0.5–1.9)

## 2022-01-29 LAB — I-STAT CHEM 8, ED
BUN: 20 mg/dL (ref 6–20)
Calcium, Ion: 1.14 mmol/L — ABNORMAL LOW (ref 1.15–1.40)
Chloride: 104 mmol/L (ref 98–111)
Creatinine, Ser: 1.2 mg/dL (ref 0.61–1.24)
Glucose, Bld: 150 mg/dL — ABNORMAL HIGH (ref 70–99)
HCT: 46 % (ref 39.0–52.0)
Hemoglobin: 15.6 g/dL (ref 13.0–17.0)
Potassium: 4.5 mmol/L (ref 3.5–5.1)
Sodium: 137 mmol/L (ref 135–145)
TCO2: 22 mmol/L (ref 22–32)

## 2022-01-29 LAB — CBG MONITORING, ED
Glucose-Capillary: 139 mg/dL — ABNORMAL HIGH (ref 70–99)
Glucose-Capillary: 141 mg/dL — ABNORMAL HIGH (ref 70–99)
Glucose-Capillary: 99 mg/dL (ref 70–99)

## 2022-01-29 LAB — ACETAMINOPHEN LEVEL: Acetaminophen (Tylenol), Serum: <10 ug/mL — ABNORMAL LOW (ref 10–30)

## 2022-01-29 LAB — ETHANOL: Alcohol, Ethyl (B): 10 mg/dL — ABNORMAL HIGH (ref <10)

## 2022-01-29 LAB — APTT
aPTT: 27 seconds (ref 24–36)
aPTT: 31 seconds (ref 24–36)

## 2022-01-29 LAB — MAGNESIUM: Magnesium: 1.9 mg/dL (ref 1.7–2.4)

## 2022-01-29 LAB — RESP PANEL BY RT-PCR (FLU A&B, COVID) ARPGX2
Influenza A by PCR: NEGATIVE
Influenza B by PCR: NEGATIVE
SARS Coronavirus 2 by RT PCR: NEGATIVE

## 2022-01-29 IMAGING — DX DG FOOT COMPLETE 3+V*R*
3 series · 3 of 3 positions shown · non-contrast
Comparison: Right foot x-ray dated [DATE]

CLINICAL DATA: Pain

EXAM:
RIGHT FOOT COMPLETE - 3 VIEW

[foot ap]
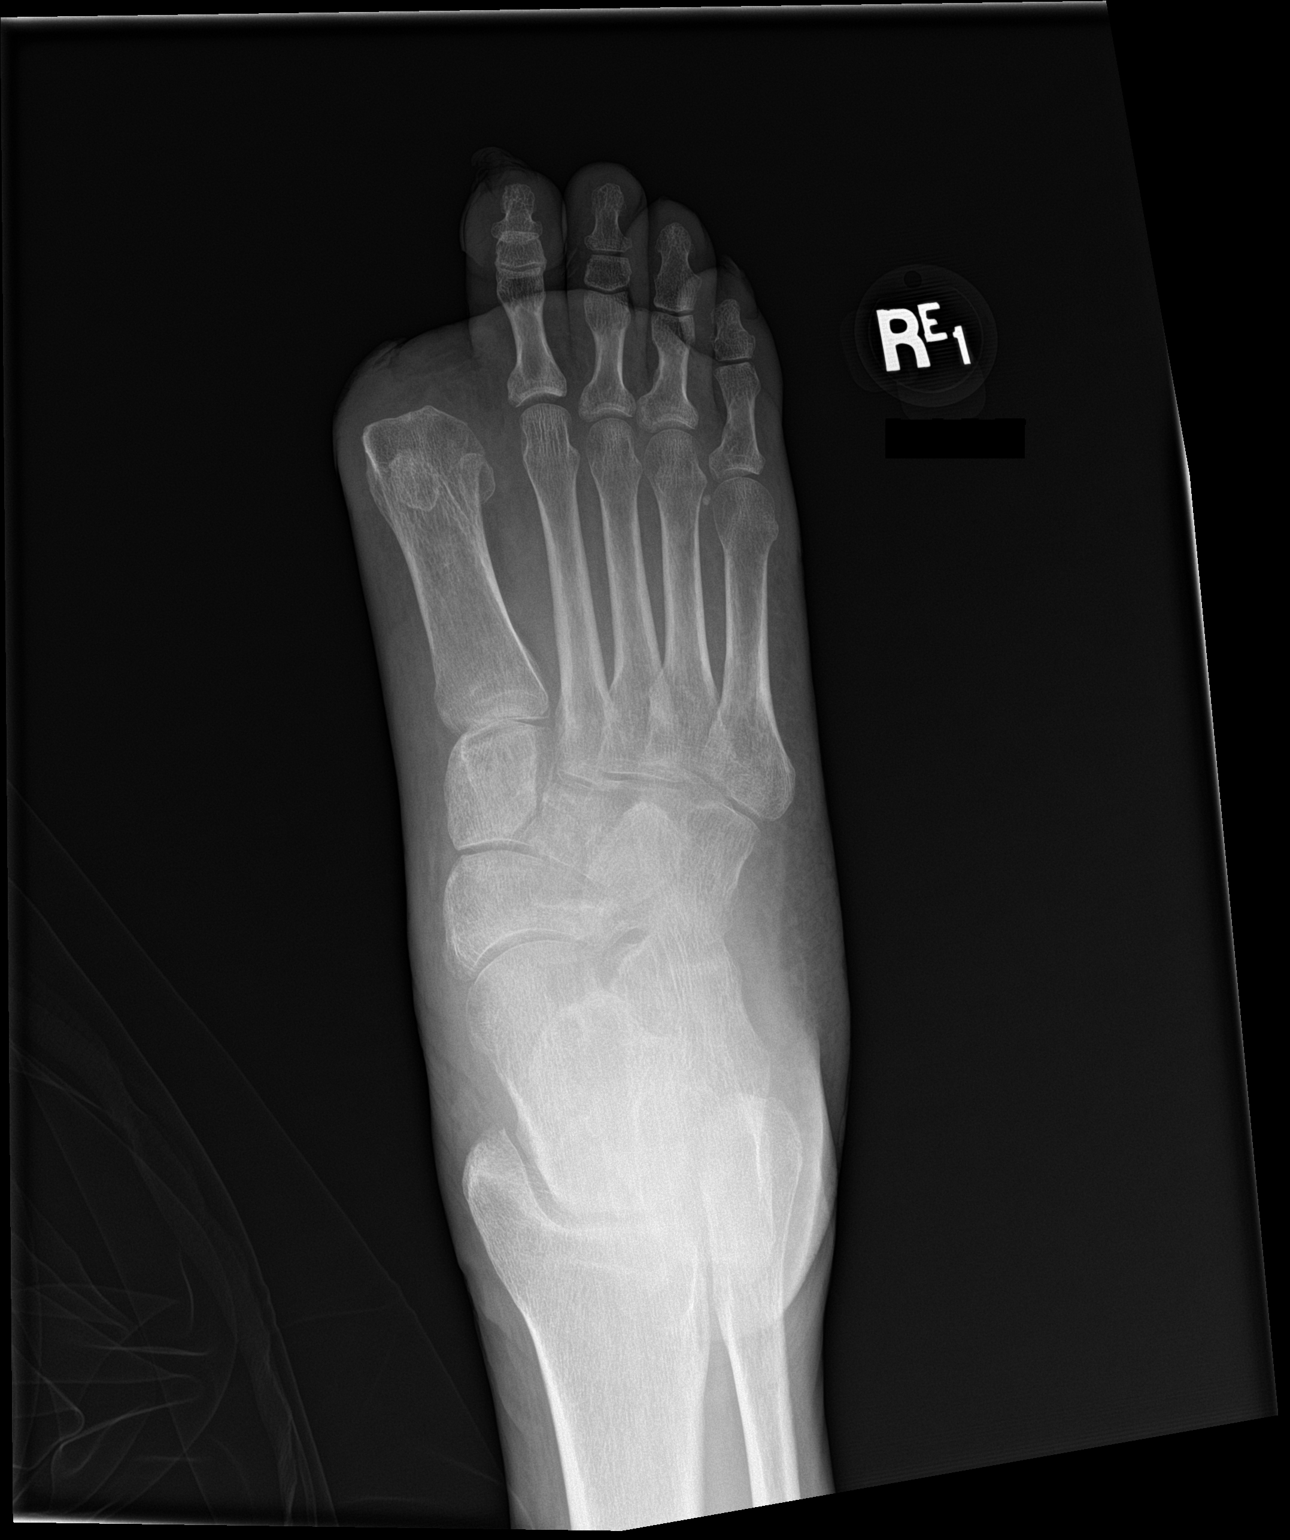

[foot obl]
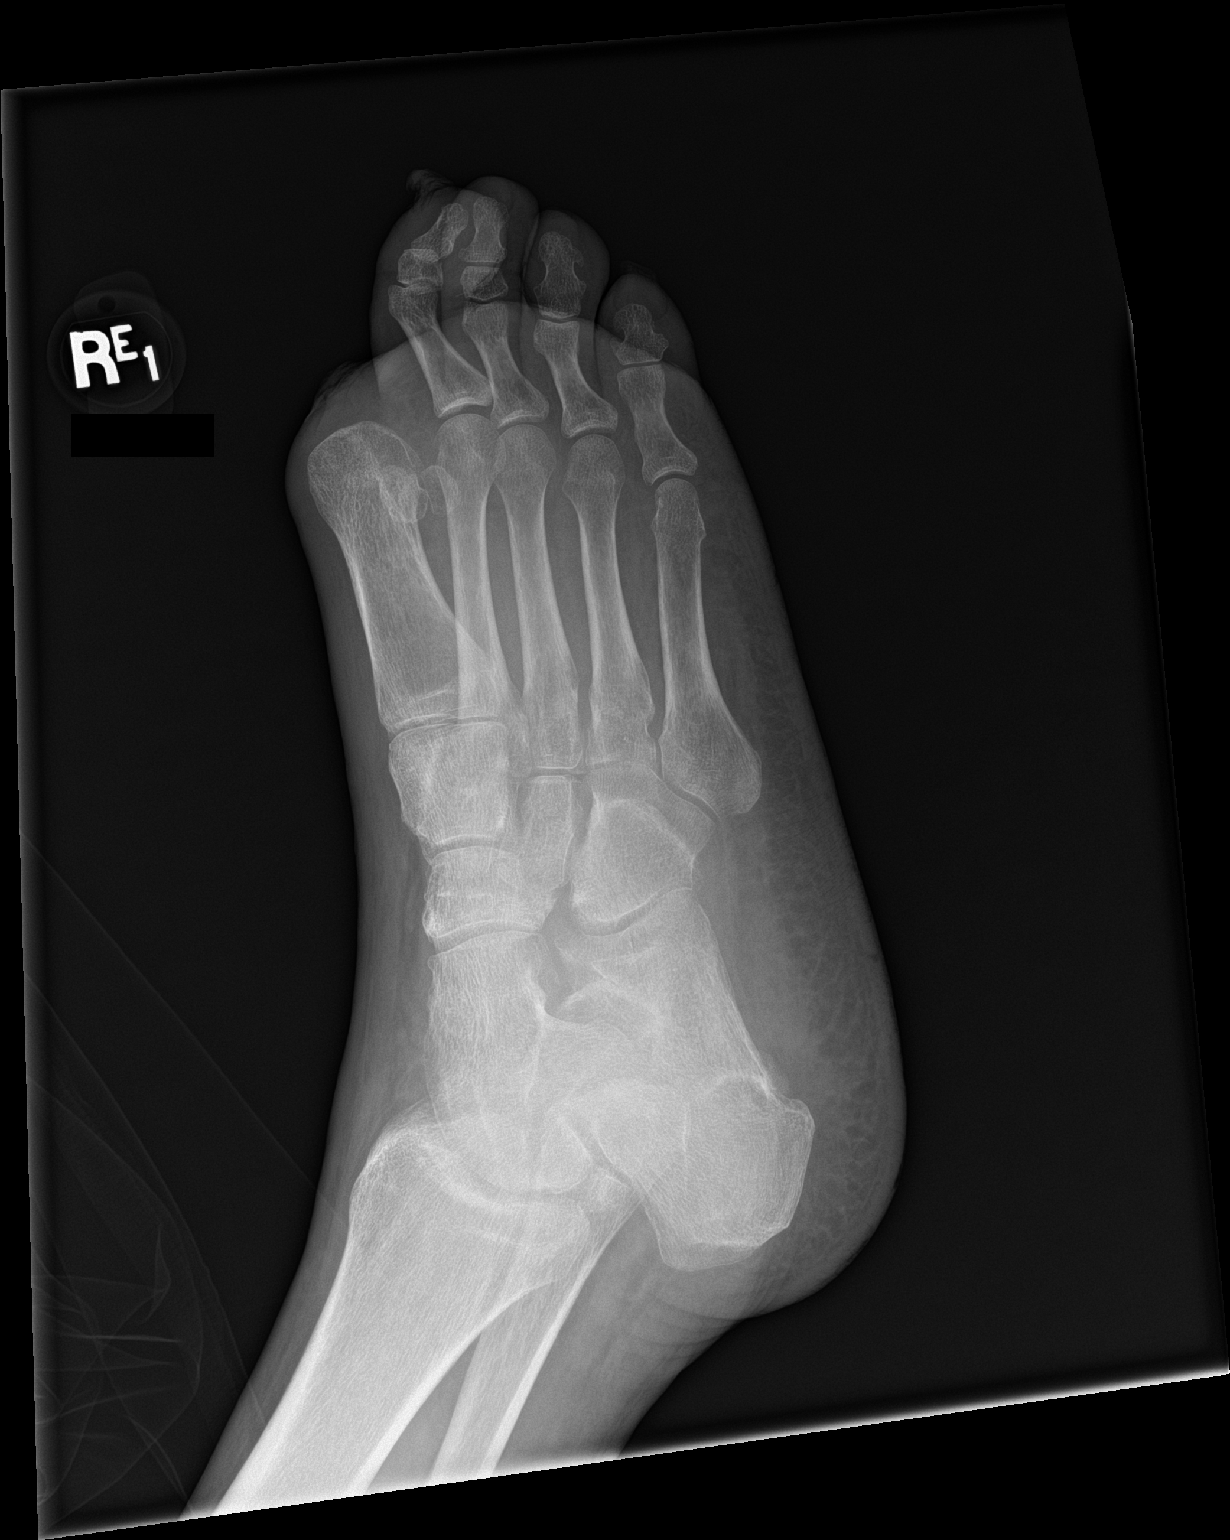

[foot lat]
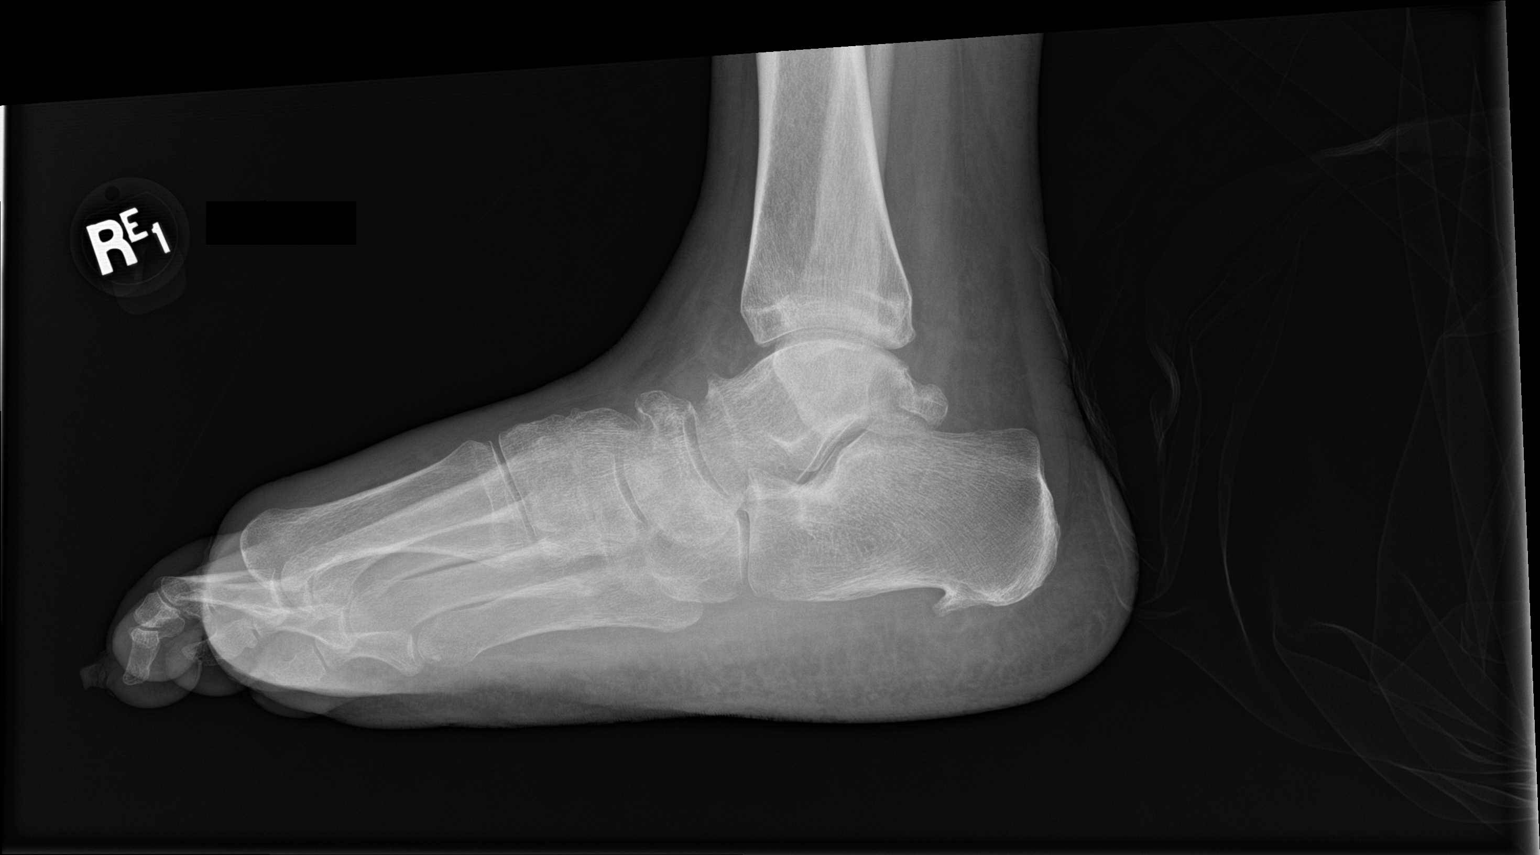

[3 of 3 positions shown; findings below may reference images not displayed]

FINDINGS: Prior resection of the right great toe. No erosive changes or
osseous destruction. No evidence of acute fracture or dislocation.
Mild degenerative changes of the midfoot and calcaneal spur. Diffuse
soft tissue swelling.
IMPRESSION: 1. No acute osseous abnormality.
2. Diffuse soft tissue swelling.

## 2022-01-29 IMAGING — MR MR HEAD W/O CM
8 of 9 series · 40 of 48 positions shown · non-contrast
Comparison: Head CT [DATE] and MRI [DATE]

CLINICAL DATA: Neuro deficit, acute, stroke suspected. Slurred
speech.

EXAM:
MRI HEAD WITHOUT CONTRAST
TECHNIQUE: Multiplanar, multiecho pulse sequences of the brain and surrounding
structures were obtained without intravenous contrast.

[Series 3: DWI · axial · 3.0mm · 1.09mm/px · z∈[-73,+88]mm · 9 of 110 slices shown (1 of 4)]
[im 1/110]
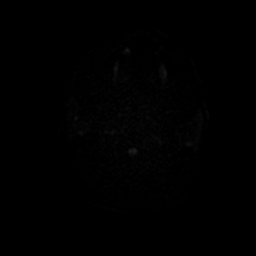
[im 20/110]
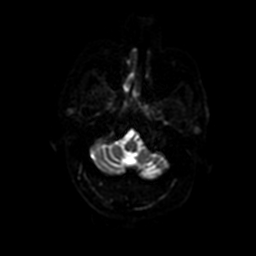
[im 30/110]
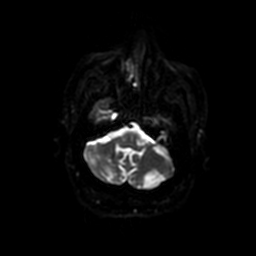
[im 50/110]
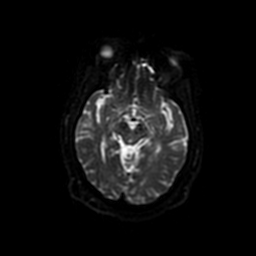
[im 60/110]
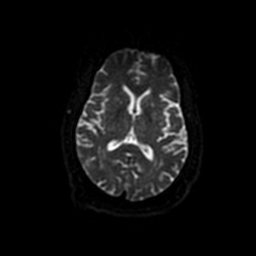
[im 80/110]
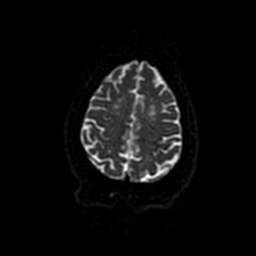
[im 90/110]
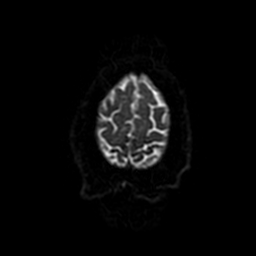
[im 100/110]
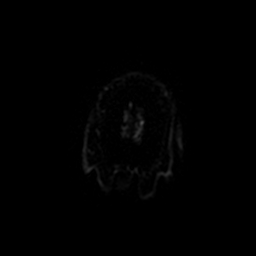
[im 110/110]
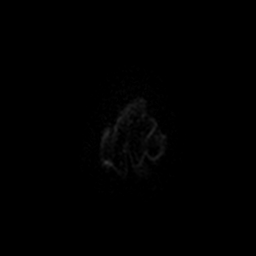

[Series 4: DWI · coronal · 5.0mm · 1.09mm/px · 9 of 80 slices shown (2 of 4)]
[im 1/80]
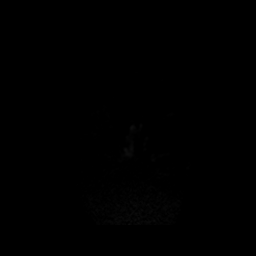
[im 10/80]
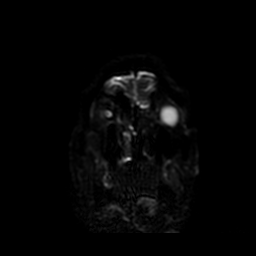
[im 20/80]
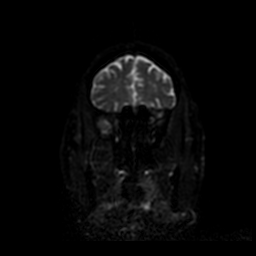
[im 30/80]
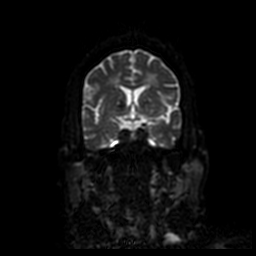
[im 40/80]
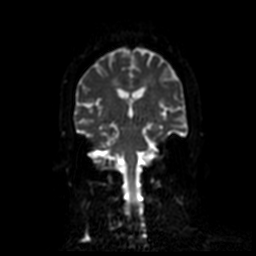
[im 50/80]
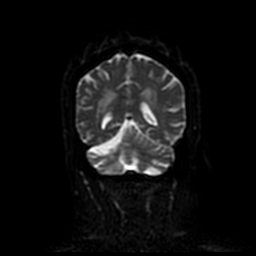
[im 60/80]
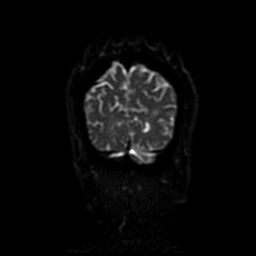
[im 70/80]
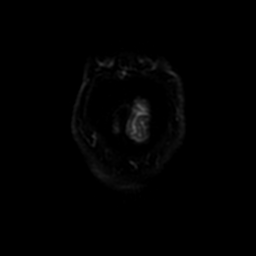
[im 80/80]
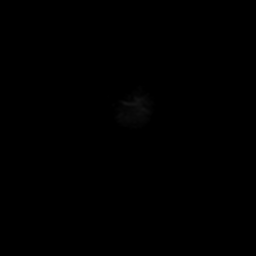

[Series 5: FLAIR · axial · 3.0mm · 0.47mm/px · z∈[-60,+77]mm · 3 of 24 slices shown]
[im 1/24]
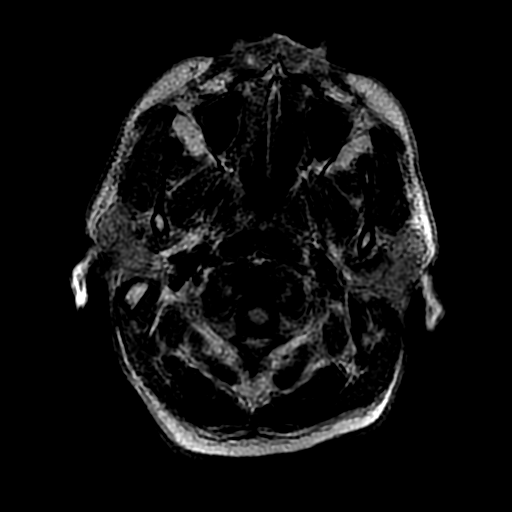
[im 12/24]
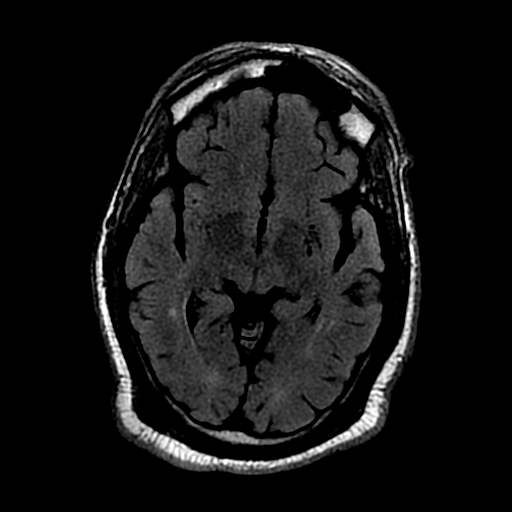
[im 24/24]
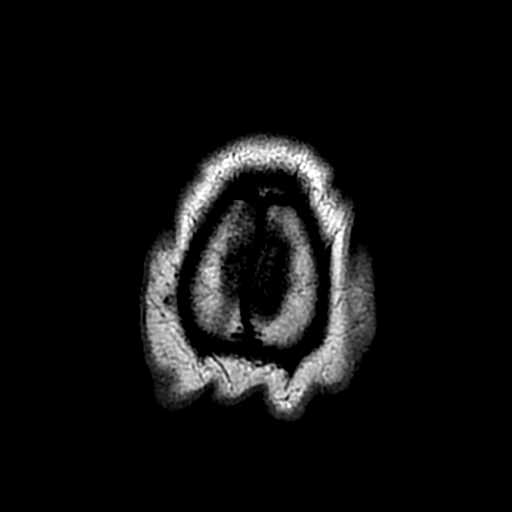

[Series 7: T2 · axial · 5.0mm · 0.47mm/px · z∈[-60,+77]mm · 3 of 24 slices shown (1 of 2)]
[im 1/24]
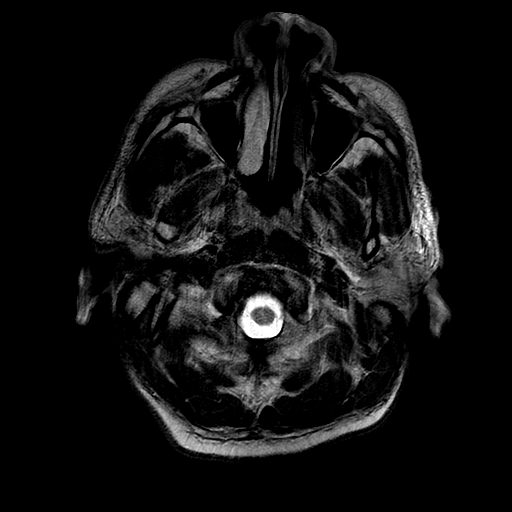
[im 12/24]
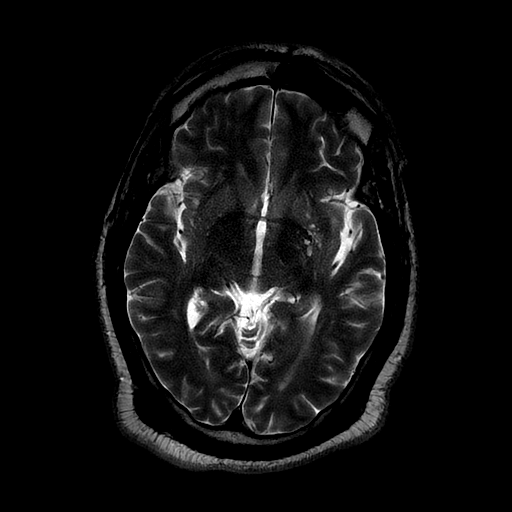
[im 24/24]
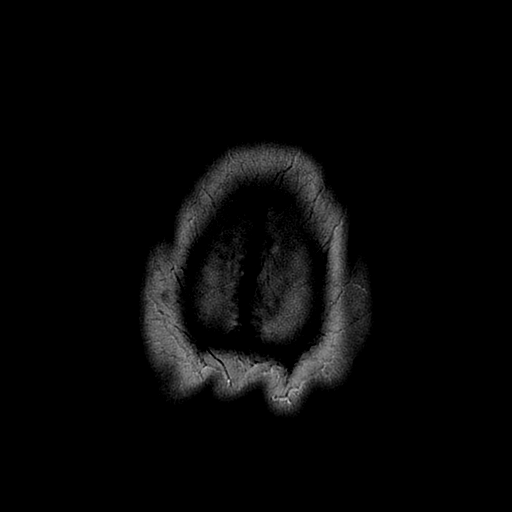

[Series 8: T1 · axial · 5.0mm · 0.47mm/px · 1 of 21 slices shown]
[im 1/21]
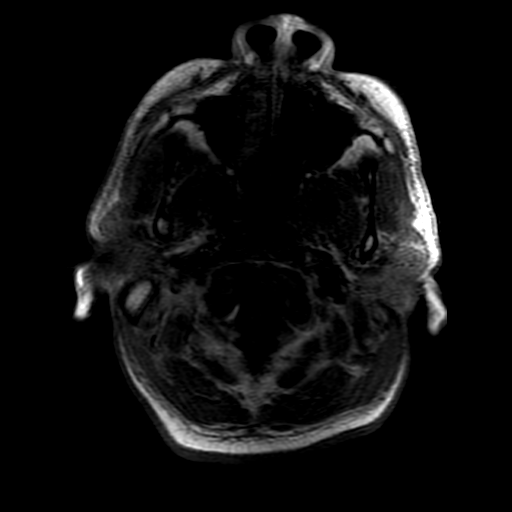

[Series 9: T2 · coronal · 5.0mm · 0.43mm/px · 3 of 24 slices shown (2 of 2)]
[im 1/24]
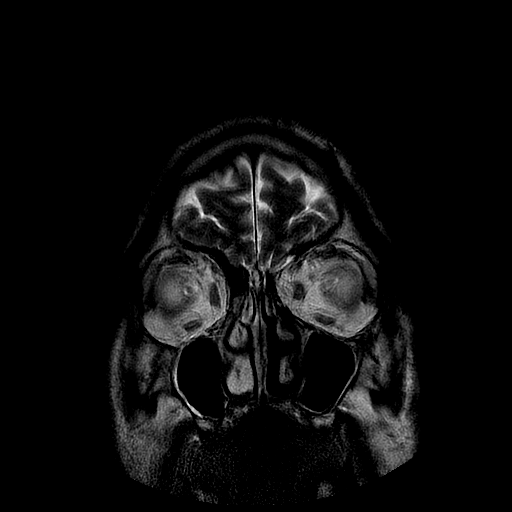
[im 12/24]
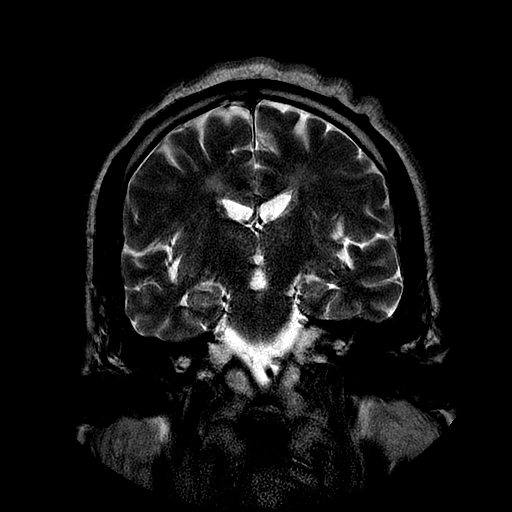
[im 24/24]
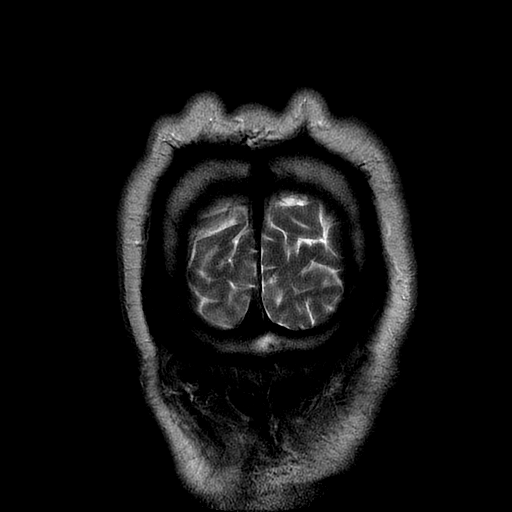

[Series 300: DWI · axial · 3.0mm · 1.09mm/px · z∈[-73,+88]mm · 7 of 55 slices shown (3 of 4)]
[im 1/55]
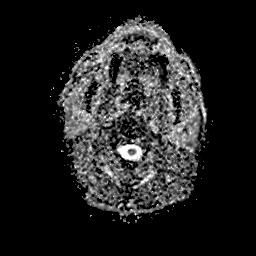
[im 10/55]
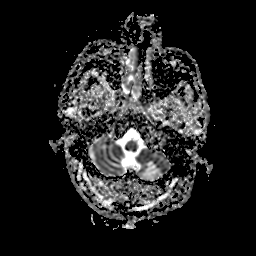
[im 19/55]
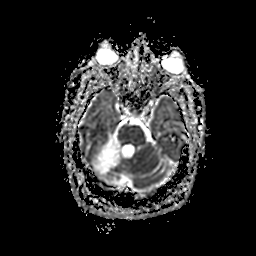
[im 28/55]
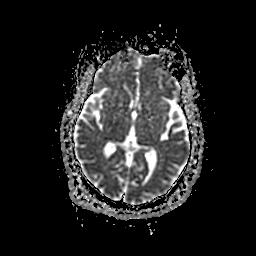
[im 37/55]
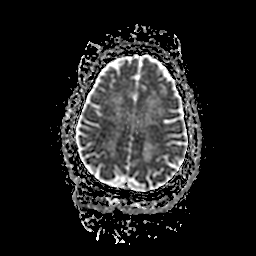
[im 46/55]
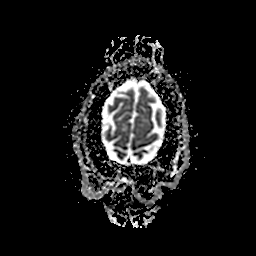
[im 55/55]
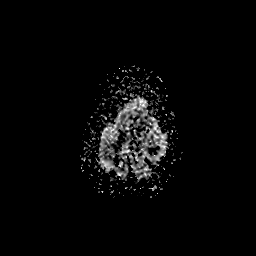

[Series 400: DWI · coronal · 5.0mm · 1.09mm/px · 5 of 40 slices shown (4 of 4)]
[im 1/40]
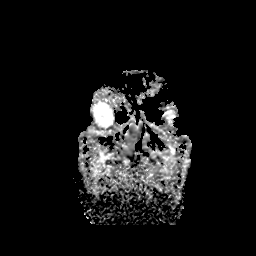
[im 10/40]
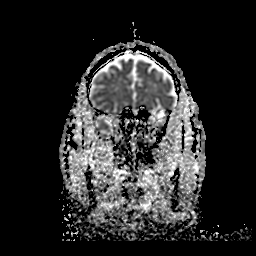
[im 20/40]
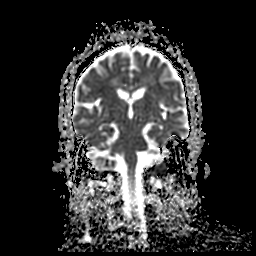
[im 30/40]
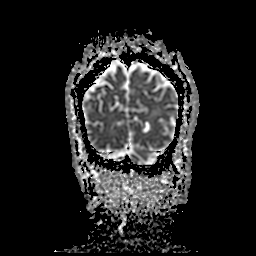
[im 40/40]
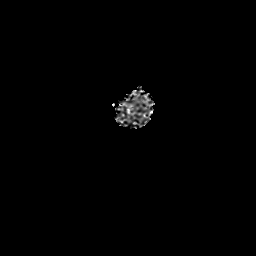

[40 of 48 positions shown; findings below may reference images not displayed]

FINDINGS: Due to patient agitation, the study is mildly motion degraded and a
sagittal T1 sequence was not obtained.

Brain: There is no evidence of an acute infarct, intracranial
hemorrhage, mass, midline shift, hydrocephalus, or extra-axial fluid
collection. Moderate-sized chronic bilateral cerebellar infarcts are
again noted. Patchy T2 hyperintensities in the cerebral white matter
bilaterally are unchanged from the prior MRI and are nonspecific but
compatible with moderate chronic small vessel ischemic disease.
Cerebral volume is normal.

Vascular: Major intracranial vascular flow voids are preserved.

Skull and upper cervical spine: Unremarkable bone marrow signal.

Sinuses/Orbits: Remote medial left orbital fracture. Minimal mucosal
thickening in the paranasal sinuses. Clear mastoid air cells.

Other: None.
IMPRESSION: 1. No acute intracranial abnormality.
2. Moderate chronic small vessel ischemic disease.
3. Chronic cerebellar infarcts.

## 2022-01-29 IMAGING — DX DG CHEST 1V PORT
1 series · 1 of 1 positions shown · non-contrast
Comparison: Chest radiograph [DATE]

CLINICAL DATA: Questionable sepsis. Shortness of breath and
weakness.

EXAM:
PORTABLE CHEST 1 VIEW

[chest ap]
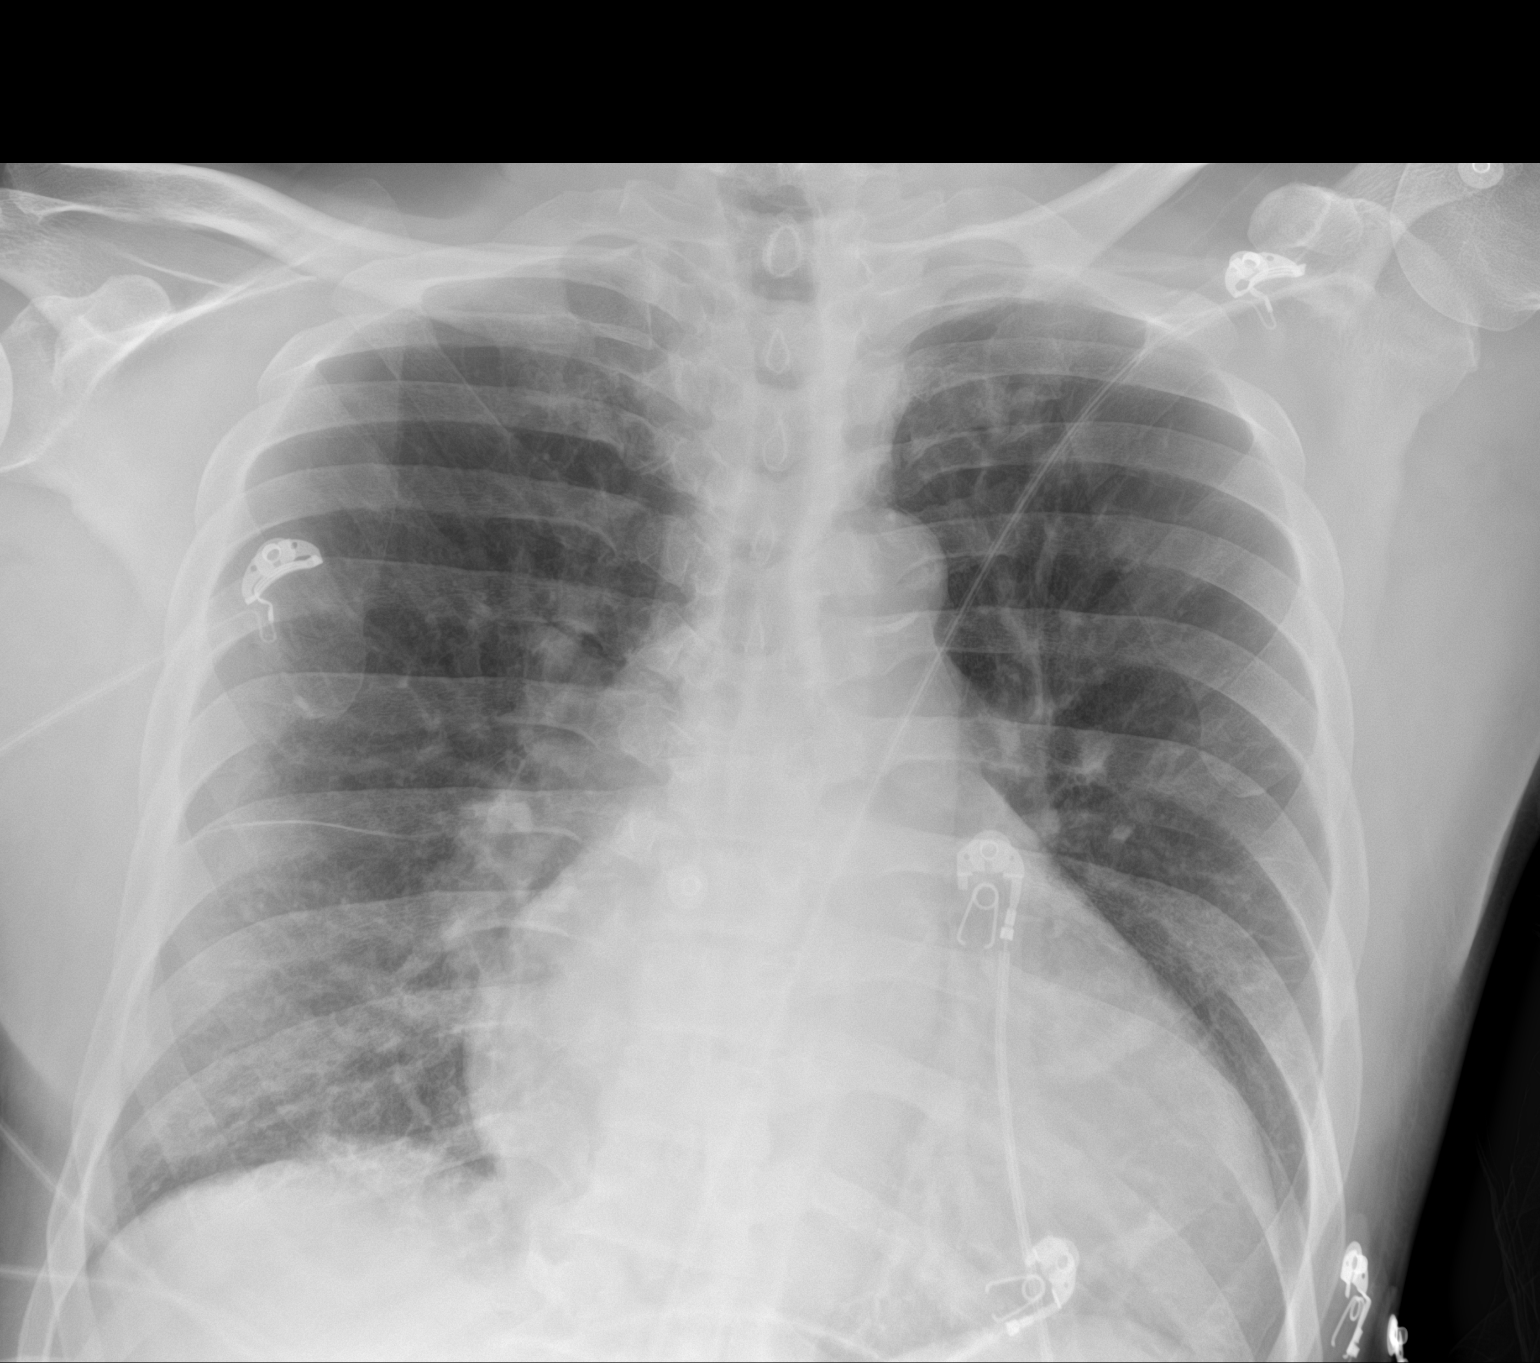

[1 of 1 positions shown; findings below may reference images not displayed]

FINDINGS: There is new mild enlargement of the cardiac silhouette. Aortic
atherosclerosis is noted. There is slight pulmonary vascular
congestion. New asymmetric interstitial, slightly nodular densities
are present in the right lower lung. Minimal interstitial densities
are present peripherally in the left mid lung. No large pleural
effusion or pneumothorax is identified. No acute osseous abnormality
is seen.
IMPRESSION: Cardiomegaly with interstitial densities in the right greater than
left lungs which could reflect infection (including atypical/viral
etiologies) or early edema.

## 2022-01-29 MED ORDER — INSULIN ASPART 100 UNIT/ML IJ SOLN
0.0000 [IU] | Freq: Three times a day (TID) | INTRAMUSCULAR | Status: DC
  Administered 2022-01-30: 1 [IU] via SUBCUTANEOUS
  Administered 2022-01-31: 2 [IU] via SUBCUTANEOUS
  Administered 2022-01-31 – 2022-02-01 (×2): 1 [IU] via SUBCUTANEOUS
  Administered 2022-02-01: 2 [IU] via SUBCUTANEOUS
  Administered 2022-02-04 (×2): 1 [IU] via SUBCUTANEOUS

## 2022-01-29 MED ORDER — LORAZEPAM 2 MG/ML IJ SOLN
1.0000 mg | Freq: Once | INTRAMUSCULAR | Status: AC
  Administered 2022-01-29: 1 mg via INTRAVENOUS
  Filled 2022-01-29: qty 1

## 2022-01-29 MED ORDER — SODIUM CHLORIDE 0.9% FLUSH: 3.0000 mL | INTRAVENOUS | Status: DC | PRN

## 2022-01-29 MED ORDER — ADULT MULTIVITAMIN W/MINERALS CH
1.0000 | ORAL_TABLET | Freq: Every day | ORAL | Status: DC
  Administered 2022-01-29 – 2022-02-04 (×5): 1 via ORAL
  Filled 2022-01-29 (×7): qty 1

## 2022-01-29 MED ORDER — SPIRONOLACTONE 12.5 MG HALF TABLET
12.5000 mg | ORAL_TABLET | Freq: Every day | ORAL | Status: DC
  Administered 2022-01-30 – 2022-02-01 (×3): 12.5 mg via ORAL
  Filled 2022-01-29 (×5): qty 1

## 2022-01-29 MED ORDER — FOLIC ACID 1 MG PO TABS
1.0000 mg | ORAL_TABLET | Freq: Every day | ORAL | Status: DC
  Administered 2022-01-29 – 2022-02-04 (×6): 1 mg via ORAL
  Filled 2022-01-29 (×7): qty 1

## 2022-01-29 MED ORDER — ENOXAPARIN SODIUM 40 MG/0.4ML IJ SOSY
40.0000 mg | PREFILLED_SYRINGE | INTRAMUSCULAR | Status: DC
  Administered 2022-01-30 – 2022-02-04 (×6): 40 mg via SUBCUTANEOUS
  Filled 2022-01-29 (×7): qty 0.4

## 2022-01-29 MED ORDER — HYDRALAZINE HCL 20 MG/ML IJ SOLN
10.0000 mg | Freq: Three times a day (TID) | INTRAMUSCULAR | Status: DC | PRN
  Administered 2022-02-03: 10 mg via INTRAVENOUS
  Filled 2022-01-29: qty 1

## 2022-01-29 MED ORDER — SODIUM CHLORIDE 0.9% FLUSH
3.0000 mL | Freq: Two times a day (BID) | INTRAVENOUS | Status: DC
  Administered 2022-01-29 – 2022-01-30 (×3): 3 mL via INTRAVENOUS

## 2022-01-29 MED ORDER — SPIRONOLACTONE 25 MG PO TABS: 25.0000 mg | ORAL_TABLET | Freq: Every day | ORAL | Status: DC

## 2022-01-29 MED ORDER — SODIUM CHLORIDE 0.9 % IV SOLN: 250.0000 mL | INTRAVENOUS | Status: DC | PRN

## 2022-01-29 MED ORDER — LEVETIRACETAM 500 MG PO TABS
500.0000 mg | ORAL_TABLET | Freq: Two times a day (BID) | ORAL | Status: DC
  Administered 2022-01-30 – 2022-02-04 (×8): 500 mg via ORAL
  Filled 2022-01-29 (×13): qty 1

## 2022-01-29 MED ORDER — FUROSEMIDE 10 MG/ML IJ SOLN
40.0000 mg | Freq: Once | INTRAMUSCULAR | Status: AC
  Administered 2022-01-29: 40 mg via INTRAVENOUS
  Filled 2022-01-29: qty 4

## 2022-01-29 MED ORDER — ALBUTEROL SULFATE (2.5 MG/3ML) 0.083% IN NEBU
2.5000 mg | INHALATION_SOLUTION | Freq: Four times a day (QID) | RESPIRATORY_TRACT | Status: DC | PRN
  Administered 2022-01-30: 2.5 mg via RESPIRATORY_TRACT
  Filled 2022-01-29 (×3): qty 3

## 2022-01-29 MED ORDER — MORPHINE SULFATE (PF) 4 MG/ML IV SOLN
4.0000 mg | Freq: Once | INTRAVENOUS | Status: AC
  Administered 2022-01-29: 4 mg via INTRAVENOUS
  Filled 2022-01-29: qty 1

## 2022-01-29 MED ORDER — LABETALOL HCL 5 MG/ML IV SOLN
10.0000 mg | Freq: Once | INTRAVENOUS | Status: AC
  Administered 2022-01-29: 10 mg via INTRAVENOUS
  Filled 2022-01-29: qty 4

## 2022-01-29 MED ORDER — LORAZEPAM 2 MG/ML IJ SOLN
1.0000 mg | INTRAMUSCULAR | Status: AC | PRN
  Administered 2022-01-30 (×2): 2 mg via INTRAVENOUS
  Filled 2022-01-29 (×2): qty 1

## 2022-01-29 MED ORDER — UMECLIDINIUM BROMIDE 62.5 MCG/ACT IN AEPB
1.0000 | INHALATION_SPRAY | Freq: Every day | RESPIRATORY_TRACT | Status: DC
  Administered 2022-01-30 – 2022-02-03 (×3): 1 via RESPIRATORY_TRACT
  Filled 2022-01-29 (×2): qty 7

## 2022-01-29 MED ORDER — LORAZEPAM 1 MG PO TABS
1.0000 mg | ORAL_TABLET | ORAL | Status: AC | PRN
  Administered 2022-01-30: 2 mg via ORAL
  Filled 2022-01-29: qty 2

## 2022-01-29 MED ORDER — ATORVASTATIN CALCIUM 40 MG PO TABS
40.0000 mg | ORAL_TABLET | Freq: Every day | ORAL | Status: DC
  Administered 2022-01-30 – 2022-02-04 (×6): 40 mg via ORAL
  Filled 2022-01-29 (×7): qty 1

## 2022-01-29 MED ORDER — OXYCODONE HCL 5 MG PO TABS
5.0000 mg | ORAL_TABLET | Freq: Four times a day (QID) | ORAL | Status: AC | PRN
  Administered 2022-01-30 (×4): 5 mg via ORAL
  Filled 2022-01-29 (×4): qty 1

## 2022-01-29 MED ORDER — THIAMINE HCL 100 MG PO TABS
100.0000 mg | ORAL_TABLET | Freq: Every day | ORAL | Status: DC
  Administered 2022-01-29 – 2022-02-04 (×6): 100 mg via ORAL
  Filled 2022-01-29 (×7): qty 1

## 2022-01-29 MED ORDER — ASPIRIN 325 MG PO TABS
325.0000 mg | ORAL_TABLET | Freq: Every day | ORAL | Status: DC
  Administered 2022-01-29 – 2022-02-04 (×5): 325 mg via ORAL
  Filled 2022-01-29 (×7): qty 1

## 2022-01-29 MED ORDER — ONDANSETRON HCL 4 MG/2ML IJ SOLN
4.0000 mg | Freq: Once | INTRAMUSCULAR | Status: AC
  Administered 2022-01-29: 4 mg via INTRAVENOUS
  Filled 2022-01-29: qty 2

## 2022-01-29 NOTE — Assessment & Plan Note (Addendum)
56 year old male presenting with slurred and dysarthric speech, +cocaine and blood pressure 197/130. Also history of alcohol, unknown amount, "varies"  ?-observation to telemetry ?-given labetalol only in ED for pressure only, as well as ativan and morphine ?-blood pressure returned to normal and has been well controlled while in ED ?-not taking entresto from what I can tell and states he "takes medication when he can get them." Doesn't know what medication he has taken.  ?-would not use beta blocker with ongoing cocaine abuse ?-will start back his spironolactone at 12.mg ?-hydralazine prn for elevated pressures  ?-encouraged cocaine and alcohol cessation  ?-CIWA protocol  ?-SW consult for medication  ?

## 2022-01-29 NOTE — ED Notes (Signed)
Pt returned from MRI °

## 2022-01-29 NOTE — Assessment & Plan Note (Signed)
Decline patch, encouraged to quit  ?

## 2022-01-29 NOTE — ED Triage Notes (Signed)
EMS stated. I picked him up due to slurred speech, in route words did not come out but eventually came out. Has wounds on both feet. His weakness and facial drooping is normal. Sensation test was normal.  ?20g in left ac ?25gm of D10 ?CBG was 59.  ?

## 2022-01-29 NOTE — Assessment & Plan Note (Addendum)
Electrolytes wnl ?Continue telemetry, hold qt prolonging drugs (does not appear to be taking medication)  ?Repeat ekg in AM  ?

## 2022-01-29 NOTE — Assessment & Plan Note (Signed)
Unknown amount, states "it varies." likely what he can get.  ?-CIWA protocol ?-MV, thiamine and folic acid ?-? Hx of seizures, started back keppra  ?

## 2022-01-29 NOTE — Assessment & Plan Note (Addendum)
-  does not appear to be taking his zyprexa ?Repeat ekg in AM ?-psychiatry consult  ?
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
## 2022-01-29 NOTE — Assessment & Plan Note (Signed)
a1c of 5.8 in 10/2021 ?Well controlled ?Continue SSI/accuchecks, on no medication  ?

## 2022-01-29 NOTE — Assessment & Plan Note (Signed)
Denies chest pain ?Likely not taking statin ?Will start statin back, avoid beta blocker with cocaine  ?

## 2022-01-29 NOTE — ED Provider Notes (Signed)
[facility] EMERGENCY DEPARTMENT Provider Note   CSN: [id number] Arrival date & time: 01/29/22  7741     History  Chief Complaint  Patient presents with   Aphasia   Weakness    [name] is a 56 y.o. male.  Pt is a 56 yo aa male with a hx of htn, depression, schizoaffective d/o, bipolar d/o, chf, hypothyroidism, chronic foot wounds, homeless status.  Pt called EMS this morning when he woke up.  He felt like his speech was slurred when he woke up.  He was normal when he went to sleep. He has not taken any meds in several days.  It is difficult to understand patient and he is very anxious and agitated.  He has been sob and has had a cough.    Pt wants me to take off the dressings on his leg to look at his wounds.  Pt is wheelchair bound.      Home Medications Prior to Admission medications   Medication Sig Start Date End Date Taking? Authorizing Provider  albuterol (VENTOLIN HFA) 108 (90 Base) MCG/ACT inhaler Inhale 2 puffs into the lungs every 6 (six) hours as needed for wheezing or shortness of breath. 11/09/21   Terald Sleeper, MD  atorvastatin (LIPITOR) 10 MG tablet Take 4 tablets (40 mg total) by mouth daily. 11/09/21   Terald Sleeper, MD  carvedilol (COREG) 6.25 MG tablet Take 1 tablet (6.25 mg total) by mouth 2 (two) times daily with a meal. 12/09/21   Meredeth Ide, MD  levETIRAcetam (KEPPRA) 500 MG tablet Take 1 tablet (500 mg total) by mouth 2 (two) times daily. Patient not taking: Reported on 12/03/2021 11/09/21 12/09/21  Terald Sleeper, MD  OLANZapine (ZYPREXA) 5 MG tablet Take 1 tablet (5 mg total) by mouth daily. Patient not taking: Reported on 12/03/2021 11/25/21   Laveda Abbe, NP  oxyCODONE (OXY IR/ROXICODONE) 5 MG immediate release tablet Take 1 tablet (5 mg total) by mouth every 6 (six) hours as needed for severe pain or breakthrough pain. 12/09/21   Meredeth Ide, MD  pregabalin (LYRICA) 100 MG capsule Take 100 mg by mouth 3 (three)  times daily. Patient not taking: Reported on 12/03/2021    [provider]  spironolactone (ALDACTONE) 25 MG tablet Take 1 tablet (25 mg total) by mouth daily. 12/09/21   Meredeth Ide, MD  umeclidinium bromide (INCRUSE ELLIPTA) 62.5 MCG/INH AEPB Inhale 1 puff into the lungs daily. Patient not taking: Reported on 11/24/2021 11/16/20   Joycelyn Das, MD      Allergies    Ibuprofen, Acetaminophen, Lyrica cr [pregabalin er], and Peanut-containing drug products    Review of Systems   Review of Systems  Neurological:  Positive for speech difficulty.       Right arm weakness  All other systems reviewed and are negative.  Physical Exam Updated Vital Signs BP (!) 149/110    Pulse 83    Temp 98.2 F (36.8 C)    Resp (!) 27    SpO2 98%  Physical Exam Vitals and nursing note reviewed.  Constitutional:      General: He is in acute distress.  HENT:     Head: Atraumatic.     Comments: Right sided facial droop    Right Ear: External ear normal.     Left Ear: External ear normal.     Nose: Nose normal.     Mouth/Throat:     Mouth: Mucous membranes are  moist.     Pharynx: Oropharynx is clear.  Eyes:     Extraocular Movements: Extraocular movements intact.     Conjunctiva/sclera: Conjunctivae normal.     Pupils: Pupils are equal, round, and reactive to light.  Cardiovascular:     Rate and Rhythm: Normal rate and regular rhythm.     Pulses: Normal pulses.     Heart sounds: Normal heart sounds.  Pulmonary:     Effort: Pulmonary effort is normal.     Breath sounds: Normal breath sounds.  Abdominal:     General: Abdomen is flat. Bowel sounds are normal.     Palpations: Abdomen is soft.  Musculoskeletal:        General: Normal range of motion.     Cervical back: Normal range of motion and neck supple.  Skin:    Capillary Refill: Capillary refill takes less than 2 seconds.  Neurological:     Mental Status: He is alert.     Cranial Nerves: Dysarthria present.     Comments: Right  facial droop  Psychiatric:        Mood and Affect: Mood is anxious.        Behavior: Behavior is agitated.    ED Results / Procedures / Treatments   Labs (all labs ordered are listed, but only abnormal results are displayed) Labs Reviewed  RAPID URINE DRUG SCREEN, HOSP PERFORMED - Abnormal; Notable for the following components:      Result Value   Cocaine POSITIVE (*)    All other components within normal limits  URINALYSIS, ROUTINE W REFLEX MICROSCOPIC - Abnormal; Notable for the following components:   APPearance HAZY (*)    Glucose, UA 50 (*)    Hgb urine dipstick SMALL (*)    Ketones, ur 20 (*)    Protein, ur 100 (*)    Bacteria, UA RARE (*)    All other components within normal limits  ETHANOL - Abnormal; Notable for the following components:   Alcohol, Ethyl (B) 10 (*)    All other components within normal limits  COMPREHENSIVE METABOLIC PANEL - Abnormal; Notable for the following components:   CO2 21 (*)    Glucose, Bld 142 (*)    Creatinine, Ser 1.39 (*)    Calcium 8.6 (*)    All other components within normal limits  CBC WITH DIFFERENTIAL/PLATELET - Abnormal; Notable for the following components:   RDW 15.6 (*)    All other components within normal limits  ACETAMINOPHEN LEVEL - Abnormal; Notable for the following components:   Acetaminophen (Tylenol), Serum <10 (*)    All other components within normal limits  LACTIC ACID, PLASMA - Abnormal; Notable for the following components:   Lactic Acid, Venous 2.6 (*)    All other components within normal limits  CBG MONITORING, ED - Abnormal; Notable for the following components:   Glucose-Capillary 141 (*)    All other components within normal limits  CBG MONITORING, ED - Abnormal; Notable for the following components:   Glucose-Capillary 139 (*)    All other components within normal limits  I-STAT CHEM 8, ED - Abnormal; Notable for the following components:   Glucose, Bld 150 (*)    Calcium, Ion 1.14 (*)    All  other components within normal limits  TROPONIN I (HIGH SENSITIVITY) - Abnormal; Notable for the following components:   Troponin I (High Sensitivity) 47 (*)    All other components within normal limits  TROPONIN I (HIGH SENSITIVITY) - Abnormal; Notable  for the following components:   Troponin I (High Sensitivity) 41 (*)    All other components within normal limits  RESP PANEL BY RT-PCR (FLU A&B, COVID) ARPGX2  CULTURE, BLOOD (ROUTINE X 2)  CULTURE, BLOOD (ROUTINE X 2)  URINE CULTURE  PROTIME-INR  APTT  MAGNESIUM  LACTIC ACID, PLASMA  CBC WITH DIFFERENTIAL/PLATELET  URINALYSIS, ROUTINE W REFLEX MICROSCOPIC  RAPID URINE DRUG SCREEN, HOSP PERFORMED  URINALYSIS, ROUTINE W REFLEX MICROSCOPIC  PROTIME-INR  APTT  BRAIN NATRIURETIC PEPTIDE    EKG EKG Interpretation  Date/Time:  Wednesday January 29 2022 09:30:57 EST Ventricular Rate:  97 PR Interval:  180 QRS Duration: 86 QT Interval:  404 QTC Calculation: 513 R Axis:   -55 Text Interpretation: Normal sinus rhythm Right atrial enlargement Left axis deviation Minimal voltage criteria for LVH, may be normal variant ( Sokolow-Lyon ) Nonspecific ST and T wave abnormality Prolonged QT Abnormal ECG When compared with ECG of 03-Dec-2021 09:38, PREVIOUS ECG IS PRESENT Baseline wander is present now Non-specific ST-t changes are present QT has lengthened Otherwise no significant change Confirmed by Mancel Bale [phone]) on 01/29/2022 9:50:33 AM  Radiology CT HEAD WO CONTRAST  Result Date: 01/29/2022 CLINICAL DATA:  Slurred speech, generalized weakness EXAM: CT HEAD WITHOUT CONTRAST TECHNIQUE: Contiguous axial images were obtained from the base of the skull through the vertex without intravenous contrast. RADIATION DOSE REDUCTION: This exam was performed according to the departmental dose-optimization program which includes automated exposure control, adjustment of the mA and/or kV according to patient size and/or use of iterative reconstruction  technique. COMPARISON:  CT head 02/09/2021, MR head 01/08/2021 FINDINGS: Brain: There is no evidence of acute intracranial hemorrhage, extra-axial fluid collection, or acute infarct. Background parenchymal volume is normal. The ventricles are normal in size. There are remote infarcts in the bilateral cerebellar hemispheres in the right SCA and left PICA distributions, unchanged. Patchy hypodensity in the supratentorial white matter likely reflects sequela of moderate chronic white matter microangiopathy. Otherwise, gray-white differentiation is preserved. There is no mass lesion.  There is no mass effect or midline shift. Vascular: No hyperdense vessel or unexpected calcification. Skull: There is a remote fracture of the left lamina papyracea. There is no acute fracture or suspicious osseous lesion. Sinuses/Orbits: The imaged paranasal sinuses are clear. The globes and orbits are unremarkable. Other: None. IMPRESSION: 1. No acute intracranial pathology. 2. Unchanged remote infarcts in the bilateral cerebellar hemispheres. Electronically Signed   By: Lesia Hausen M.D.   On: 01/29/2022 10:52   MR BRAIN WO CONTRAST  Result Date: 01/29/2022 CLINICAL DATA:  Neuro deficit, acute, stroke suspected. Slurred speech. EXAM: MRI HEAD WITHOUT CONTRAST TECHNIQUE: Multiplanar, multiecho pulse sequences of the brain and surrounding structures were obtained without intravenous contrast. COMPARISON:  Head CT 01/29/2022 and MRI 01/08/2021 FINDINGS: Due to patient agitation, the study is mildly motion degraded and a sagittal T1 sequence was not obtained. Brain: There is no evidence of an acute infarct, intracranial hemorrhage, mass, midline shift, hydrocephalus, or extra-axial fluid collection. Moderate-sized chronic bilateral cerebellar infarcts are again noted. Patchy T2 hyperintensities in the cerebral white matter bilaterally are unchanged from the prior MRI and are nonspecific but compatible with moderate chronic small vessel  ischemic disease. Cerebral volume is normal. Vascular: Major intracranial vascular flow voids are preserved. Skull and upper cervical spine: Unremarkable bone marrow signal. Sinuses/Orbits: Remote medial left orbital fracture. Minimal mucosal thickening in the paranasal sinuses. Clear mastoid air cells. Other: None. IMPRESSION: 1. No acute intracranial abnormality. 2. Moderate chronic  small vessel ischemic disease. 3. Chronic cerebellar infarcts. Electronically Signed   By: Sebastian Ache M.D.   On: 01/29/2022 13:24   DG Chest Port 1 View  Result Date: 01/29/2022 CLINICAL DATA:  Questionable sepsis. Shortness of breath and weakness. EXAM: PORTABLE CHEST 1 VIEW COMPARISON:  Chest radiograph 11/24/2019 FINDINGS: There is new mild enlargement of the cardiac silhouette. Aortic atherosclerosis is noted. There is slight pulmonary vascular congestion. New asymmetric interstitial, slightly nodular densities are present in the right lower lung. Minimal interstitial densities are present peripherally in the left mid lung. No large pleural effusion or pneumothorax is identified. No acute osseous abnormality is seen. IMPRESSION: Cardiomegaly with interstitial densities in the right greater than left lungs which could reflect infection (including atypical/viral etiologies) or early edema. Electronically Signed   By: Sebastian Ache M.D.   On: 01/29/2022 10:19    Procedures Procedures    Medications Ordered in ED Medications  aspirin tablet 325 mg (325 mg Oral Given 01/29/22 1213)  morphine (PF) 4 MG/ML injection 4 mg (4 mg Intravenous Given 01/29/22 1021)  ondansetron (ZOFRAN) injection 4 mg (4 mg Intravenous Given 01/29/22 1021)  LORazepam (ATIVAN) injection 1 mg (1 mg Intravenous Given 01/29/22 1021)  morphine (PF) 4 MG/ML injection 4 mg (4 mg Intravenous Given 01/29/22 1208)  LORazepam (ATIVAN) injection 1 mg (1 mg Intravenous Given 01/29/22 1208)  labetalol (NORMODYNE) injection 10 mg (10 mg Intravenous Given 01/29/22 1348)   furosemide (LASIX) injection 40 mg (40 mg Intravenous Given 01/29/22 1348)    ED Course/ Medical Decision Making/ A&P                           Medical Decision Making Amount and/or Complexity of Data Reviewed Labs: ordered. Radiology: ordered.  Risk OTC drugs. Prescription drug management. Decision regarding hospitalization.   This patient presents to the ED for concern of facial weakness, this involves an extensive number of treatment options, and is a complaint that carries with it a high risk of complications and morbidity.  The differential diagnosis includes cva, bells palsy, metabolic d/o   Co morbidities that complicate the patient evaluation  htn, depression, schizoaffective d/o, bipolar d/o, chf, hypothyroidism, chronic foot wounds, homeless status.   Additional history obtained:  Additional history obtained from epic chart review External records from outside source obtained and reviewed including EMS report   Lab Tests:  I Ordered, and personally interpreted labs.  The pertinent results include:  uds + cocaine, ua + ketones, trop 41, ckd with cr of 1.39, cbc nl. Covid/flu neg   Imaging Studies ordered:  I ordered imaging studies including cxr  I independently visualized and interpreted imaging which showed    IMPRESSION:  Cardiomegaly with interstitial densities in the right greater than  left lungs which could reflect infection (including atypical/viral  etiologies) or early edema.  CT head:   IMPRESSION:  1. No acute intracranial pathology.  2. Unchanged remote infarcts in the bilateral cerebellar  hemispheres.      I agree with the radiologist interpretation   Cardiac Monitoring:  The patient was maintained on a cardiac monitor.  I personally viewed and interpreted the cardiac monitored which showed an underlying rhythm of: nsr   Medicines ordered and prescription drug management:  I ordered medication including morphine/ativan  for pain  and anxiety/  Reevaluation of the patient after these medicines showed that the patient improved I have reviewed the patients home medicines and have made  adjustments as needed   Test Considered:  mri   Critical Interventions:  Ct/mri   Consultations Obtained:  I requested consultation with the neurologist (Dr. Derry Lory),  and discussed lab and imaging findings as well as pertinent plan - he recommends: MRI Pt d/w Dr. Artis Flock ([facility]) who will admit.   Problem List / ED Course:  Facial droop and right arm weakness:  Pt is not LVO +, so no code stroke.  MRI ordered Htn:  permissive htn until mri shows no stroke.  MRI neg for acute stroke, so pt given lasix and labetalol.  CXR with edema. Probable chf.  Could be due to uncontrolled htn and cocaine abuse. Last echo was in Jan of last year which showed an EF of <20%.  Lasix not on med list, but he is supposed to take spiro.  He has been noncompliant. Right foot wounds:  followed by wound care and looks better according to them. Homeless status:  no pcp   Reevaluation:  After the interventions noted above, I reevaluated the patient and found that they have :improved   Social Determinants of Health:  homeless   Dispostion:  After consideration of the diagnostic results and the patients response to treatment, I feel that the patent would benefit from admission.          Final Clinical Impression(s) / ED Diagnoses Final diagnoses:  Weakness  Dysarthria  Hypertension, unspecified type  Cocaine abuse [facility])  Homeless    Rx / DC Orders ED Discharge Orders     None         Jacalyn Lefevre, MD 01/29/22 1435

## 2022-01-29 NOTE — Assessment & Plan Note (Signed)
No signs/symptoms of exacerbation ?Not taking his incruse ?Will start this back and albuterol prn  ?

## 2022-01-29 NOTE — Assessment & Plan Note (Signed)
Troponin flat and near baseline in setting of cocaine use/HTN urgency. Likely demand ischemia  ?No chest pain, ekg with no ST changes ?Continue telemetry and monitoring  ?

## 2022-01-29 NOTE — Assessment & Plan Note (Addendum)
Current user with + UDS today and HTN urgency ?-avoid beta blocker with frequent use ?-SW consult ?

## 2022-01-29 NOTE — ED Provider Triage Note (Signed)
Emergency Medicine Provider Triage Evaluation Note ? ?[name] , a 56 y.o. male  was evaluated in triage.  Pt complains of slurred speech subjectively worsened since this morning. EMS notes says he has decreased sensation and right sided facial droop and right sided weakness at baseline per patient and notes with EMS. Went to bed "after the sun went down" and was at his baseline. Woke up with worsening speech and called EMS. Hypoglycemic at 59 given D25 and was at 61. The patient has trouble speaking and finding words.  ?Review of Systems  ?Positive: Slurred speech, worsening right sided weakness ?Negative:  ? ?Physical Exam  ?BP (!) 197/130   Pulse (!) 102   Temp 98.2 ?F (36.8 ?C)   Resp (!) 22   SpO2 99%  ?Gen:   Awake, aphasic, trouble finding words.  ?Resp:  Normal effort  ?MSK:   Right sided weakness  ?Other:  Erythema and swelling to the lateral aspect of the left foot and ankle. Palpable DP pulse on the left.  Unable to visualize right foot and ankle as it is wrapped.  ? ?Medical Decision Making  ?Medically screening exam initiated at 9:30 AM.  Appropriate orders placed.  Jamin Panther was informed that the remainder of the evaluation will be completed by another provider, this initial triage assessment does not replace that evaluation, and the importance of remaining in the ED until their evaluation is complete. ? ?CBG at 141. ? ?Communication and history difficult to obtain due to the patient's speech.  ? ?Broad differential including hypoglycemia, stroke, intoxication, sepsis for foot wounds. Because of unknown last well, will order stroke order set without the code stroke after discussing with my attending attached to this chart.  ? ?Nursing staff and charge nurse aware that the patient needs to be roomed now.  ?  ?Achille Rich, PA-C ?01/29/22 5027 ? ?

## 2022-01-29 NOTE — ED Notes (Signed)
Patient transported to MRI 

## 2022-01-29 NOTE — Assessment & Plan Note (Addendum)
Patient appears euvolemic to dry on appearance with no shortness of breath, weight gain.  ?-CXR with cardiomegaly and possible early edema vs. Infection. procalcitonin ordered  ?-given 40mg  of lasix in ED, will not give more as does not appear in acute exacerbation at this time ?-echo in 12/2020: EF of <20% with severely decreased LVF, global hypokinesis. Grade II diastolic dysfunction. Will repeat echo, but has poor f/u and does not take medication  ?-strict I/O  ?-does not take medication on regular basis. Unsure if even picked up entresto as not in med list after his hospital d/c in January.  ?-would not use beta blocker with ongoing cocaine abuse ?-continue aldactone, decreased dose to 12.5mg  as blood pressure has been well controlled after being in ED for sometime  ?
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
## 2022-01-29 NOTE — Assessment & Plan Note (Signed)
Continue lipitor  ?

## 2022-01-29 NOTE — Assessment & Plan Note (Addendum)
Ulcers appear to be much improved from hospitalization in January. Has chronic pain in feet.  ?-no signs of infection ?-foot xray done with no acute abnormality, diffuse soft tissue swelling ?-no increased WBC  ?-normal ABI in 03/2021, will repeat with pain  ?-no fever/increased WBC to suspect infection at this time  ?

## 2022-01-30 ENCOUNTER — Observation Stay (HOSPITAL_COMMUNITY): Payer: Medicaid Other

## 2022-01-30 DIAGNOSIS — F1410 Cocaine abuse, uncomplicated: Secondary | ICD-10-CM

## 2022-01-30 DIAGNOSIS — L0390 Cellulitis, unspecified: Secondary | ICD-10-CM | POA: Diagnosis not present

## 2022-01-30 DIAGNOSIS — I160 Hypertensive urgency: Secondary | ICD-10-CM | POA: Diagnosis not present

## 2022-01-30 DIAGNOSIS — I2510 Atherosclerotic heart disease of native coronary artery without angina pectoris: Secondary | ICD-10-CM | POA: Diagnosis present

## 2022-01-30 DIAGNOSIS — Z91010 Allergy to peanuts: Secondary | ICD-10-CM | POA: Diagnosis not present

## 2022-01-30 DIAGNOSIS — I161 Hypertensive emergency: Secondary | ICD-10-CM | POA: Diagnosis present

## 2022-01-30 DIAGNOSIS — E785 Hyperlipidemia, unspecified: Secondary | ICD-10-CM | POA: Diagnosis present

## 2022-01-30 DIAGNOSIS — Z886 Allergy status to analgesic agent status: Secondary | ICD-10-CM | POA: Diagnosis not present

## 2022-01-30 DIAGNOSIS — I5042 Chronic combined systolic (congestive) and diastolic (congestive) heart failure: Secondary | ICD-10-CM | POA: Diagnosis not present

## 2022-01-30 DIAGNOSIS — Z9114 Patient's other noncompliance with medication regimen: Secondary | ICD-10-CM | POA: Diagnosis not present

## 2022-01-30 DIAGNOSIS — L97311 Non-pressure chronic ulcer of right ankle limited to breakdown of skin: Secondary | ICD-10-CM | POA: Diagnosis present

## 2022-01-30 DIAGNOSIS — I83013 Varicose veins of right lower extremity with ulcer of ankle: Secondary | ICD-10-CM | POA: Diagnosis present

## 2022-01-30 DIAGNOSIS — I69351 Hemiplegia and hemiparesis following cerebral infarction affecting right dominant side: Secondary | ICD-10-CM | POA: Diagnosis not present

## 2022-01-30 DIAGNOSIS — Z20822 Contact with and (suspected) exposure to covid-19: Secondary | ICD-10-CM | POA: Diagnosis present

## 2022-01-30 DIAGNOSIS — I110 Hypertensive heart disease with heart failure: Secondary | ICD-10-CM | POA: Diagnosis present

## 2022-01-30 DIAGNOSIS — Z91199 Patient's noncompliance with other medical treatment and regimen due to unspecified reason: Secondary | ICD-10-CM | POA: Diagnosis not present

## 2022-01-30 DIAGNOSIS — F319 Bipolar disorder, unspecified: Secondary | ICD-10-CM | POA: Diagnosis present

## 2022-01-30 DIAGNOSIS — N179 Acute kidney failure, unspecified: Secondary | ICD-10-CM | POA: Diagnosis present

## 2022-01-30 DIAGNOSIS — I10 Essential (primary) hypertension: Secondary | ICD-10-CM | POA: Diagnosis present

## 2022-01-30 DIAGNOSIS — Z888 Allergy status to other drugs, medicaments and biological substances status: Secondary | ICD-10-CM | POA: Diagnosis not present

## 2022-01-30 DIAGNOSIS — J449 Chronic obstructive pulmonary disease, unspecified: Secondary | ICD-10-CM | POA: Diagnosis present

## 2022-01-30 DIAGNOSIS — E1142 Type 2 diabetes mellitus with diabetic polyneuropathy: Secondary | ICD-10-CM | POA: Diagnosis present

## 2022-01-30 DIAGNOSIS — F250 Schizoaffective disorder, bipolar type: Secondary | ICD-10-CM | POA: Diagnosis present

## 2022-01-30 DIAGNOSIS — E039 Hypothyroidism, unspecified: Secondary | ICD-10-CM | POA: Diagnosis present

## 2022-01-30 DIAGNOSIS — I5043 Acute on chronic combined systolic (congestive) and diastolic (congestive) heart failure: Secondary | ICD-10-CM | POA: Diagnosis present

## 2022-01-30 DIAGNOSIS — F1010 Alcohol abuse, uncomplicated: Secondary | ICD-10-CM | POA: Diagnosis present

## 2022-01-30 DIAGNOSIS — Z5900 Homelessness unspecified: Secondary | ICD-10-CM | POA: Diagnosis not present

## 2022-01-30 LAB — ECHOCARDIOGRAM COMPLETE
AR max vel: 2.43 cm2
AV Area VTI: 2.17 cm2
AV Area mean vel: 2.12 cm2
AV Mean grad: 2 mmHg
AV Peak grad: 3.7 mmHg
Ao pk vel: 0.96 m/s
Area-P 1/2: 6.43 cm2
S' Lateral: 5.5 cm
Single Plane A4C EF: 31.2 %

## 2022-01-30 LAB — MAGNESIUM: Magnesium: 1.9 mg/dL (ref 1.7–2.4)

## 2022-01-30 LAB — BASIC METABOLIC PANEL
Anion gap: 10 (ref 5–15)
BUN: 28 mg/dL — ABNORMAL HIGH (ref 6–20)
CO2: 22 mmol/L (ref 22–32)
Calcium: 8.3 mg/dL — ABNORMAL LOW (ref 8.9–10.3)
Chloride: 107 mmol/L (ref 98–111)
Creatinine, Ser: 1.69 mg/dL — ABNORMAL HIGH (ref 0.61–1.24)
GFR, Estimated: 48 mL/min — ABNORMAL LOW (ref >60)
Glucose, Bld: 109 mg/dL — ABNORMAL HIGH (ref 70–99)
Potassium: 3.9 mmol/L (ref 3.5–5.1)
Sodium: 139 mmol/L (ref 135–145)

## 2022-01-30 LAB — GLUCOSE, CAPILLARY
Glucose-Capillary: 133 mg/dL — ABNORMAL HIGH (ref 70–99)
Glucose-Capillary: 135 mg/dL — ABNORMAL HIGH (ref 70–99)
Glucose-Capillary: 110 mg/dL — ABNORMAL HIGH (ref 70–99)

## 2022-01-30 LAB — CBC
HCT: 40.8 % (ref 39.0–52.0)
Hemoglobin: 13.3 g/dL (ref 13.0–17.0)
MCH: 32.9 pg (ref 26.0–34.0)
MCHC: 32.6 g/dL (ref 30.0–36.0)
MCV: 101 fL — ABNORMAL HIGH (ref 80.0–100.0)
Platelets: 282 10*3/uL (ref 150–400)
RBC: 4.04 MIL/uL — ABNORMAL LOW (ref 4.22–5.81)
RDW: 15.9 % — ABNORMAL HIGH (ref 11.5–15.5)
WBC: 6.3 10*3/uL (ref 4.0–10.5)
nRBC: 0 % (ref 0.0–0.2)

## 2022-01-30 LAB — BRAIN NATRIURETIC PEPTIDE: B Natriuretic Peptide: 1096 pg/mL — ABNORMAL HIGH (ref 0.0–100.0)

## 2022-01-30 LAB — CBG MONITORING, ED
Glucose-Capillary: 109 mg/dL — ABNORMAL HIGH (ref 70–99)
Glucose-Capillary: 88 mg/dL (ref 70–99)

## 2022-01-30 LAB — PROCALCITONIN: Procalcitonin: 0.37 ng/mL

## 2022-01-30 MED ORDER — CHLORDIAZEPOXIDE HCL 5 MG PO CAPS
10.0000 mg | ORAL_CAPSULE | Freq: Three times a day (TID) | ORAL | Status: DC
  Administered 2022-01-30: 10 mg via ORAL
  Filled 2022-01-30 (×3): qty 2

## 2022-01-30 MED ORDER — ISOSORB DINITRATE-HYDRALAZINE 20-37.5 MG PO TABS
1.0000 | ORAL_TABLET | Freq: Three times a day (TID) | ORAL | Status: DC
  Administered 2022-01-30 – 2022-02-04 (×17): 1 via ORAL
  Filled 2022-01-30 (×19): qty 1

## 2022-01-30 MED ORDER — PANTOPRAZOLE SODIUM 40 MG PO TBEC
40.0000 mg | DELAYED_RELEASE_TABLET | Freq: Every day | ORAL | Status: DC
  Administered 2022-01-30 – 2022-02-04 (×5): 40 mg via ORAL
  Filled 2022-01-30 (×6): qty 1

## 2022-01-30 MED ORDER — CLONIDINE HCL 0.2 MG PO TABS
0.2000 mg | ORAL_TABLET | Freq: Three times a day (TID) | ORAL | Status: DC
  Administered 2022-01-30 (×3): 0.2 mg via ORAL
  Filled 2022-01-30 (×3): qty 1

## 2022-01-30 MED ORDER — FUROSEMIDE 10 MG/ML IJ SOLN
20.0000 mg | Freq: Once | INTRAMUSCULAR | Status: AC
  Administered 2022-01-30: 20 mg via INTRAVENOUS
  Filled 2022-01-30: qty 2

## 2022-01-30 MED ORDER — TRAMADOL HCL 50 MG PO TABS
50.0000 mg | ORAL_TABLET | Freq: Four times a day (QID) | ORAL | Status: DC | PRN
  Administered 2022-01-30 – 2022-02-03 (×8): 50 mg via ORAL
  Filled 2022-01-30 (×9): qty 1

## 2022-01-30 MED ORDER — FUROSEMIDE 10 MG/ML IJ SOLN: 40.0000 mg | Freq: Once | INTRAMUSCULAR | Status: DC

## 2022-01-30 MED ORDER — METHYLPREDNISOLONE SODIUM SUCC 125 MG IJ SOLR
60.0000 mg | INTRAMUSCULAR | Status: DC
Start: 2022-01-30 — End: 2022-02-01
  Administered 2022-01-30 – 2022-01-31 (×2): 60 mg via INTRAVENOUS
  Filled 2022-01-30 (×2): qty 2

## 2022-01-30 NOTE — Consult Note (Shared)
Pt states he is not doing good and don't want to talk to a psychiatrist neither. Denies thoughts of self harm. Denies homicidal ideations. States he is fine on his medicine right now, then later says he has not been taking his medicines. He denies any recent hallucinations.   States he is not worried about having had a stroke. He states that he does not need to be out on the street because he has Medicaid and gets a check. He does think he needs psychotropic medications but does not endorse any symptoms, stating that   States abilify was the most helpful for his mood.   Difficult to understand a   Rude and irritable.

## 2022-01-30 NOTE — Consult Note (Signed)
[facility] New Face-to-Face Psychiatric Evaluation   Service Date: January 30, 2022 LOS:  LOS: 0 days    Assessment  [name] is a 56 y.o. male admitted medically for 01/29/2022  9:25 AM for slurred speech and dysarthria. He carries the psychiatric diagnoses of schizoaffective disorder, bipolar type, and cocaine abuse  and has a past medical history of  homelessness, non-insulin dependent diabetes mellitus type 2 (Hgb A1C 5.8% 12/14), diabetic polyneuropathy, systolic and diastolic congestive heart failure (Echo 12/2020 EF < 20% with G2DD), coronary artery disease (Cath 10/2019 showing diffuse multivessel disease), nicotine dependence, hyperlipidemia, hypertension .Psychiatry was consulted for medication mgmt by Candiss Norse.    His current presentation of irritability is most consistent with prior presentations. Charting indicates that patient has presented psychotic and irritable in the past however due to his cocaine use there has been concern whether or not his behavior is substance induced or true functional psychotic disorder. Current outpatient psychotropic medications include Zyprexa and historically he has had a decent response to these medications. He was not compliant with medications prior to admission as evidenced by patient endorsing he does not take his medications outside the hospital. On initial examination, patient is irritable but denies AVH and endorses he would be amenable to restarting Abilify as he feels it helps. Please see plan below for detailed recommendations.   Diagnoses:  Active Hospital problems: Principal Problem:   Hypertensive urgency Active Problems:   Schizoaffective disorder, bipolar type ([place])   Asthma   Tobacco use disorder   Elevated troponin   Coronary artery disease involving native coronary artery of native heart without angina pectoris   Cocaine abuse ([place])   Homelessness   HLD (hyperlipidemia)   Chronic combined systolic and diastolic  congestive heart failure (HCC)   Type 2 diabetes mellitus with diabetic polyneuropathy, without long-term current use of insulin (HCC)   Prolonged QT interval   Alcohol abuse   chronic ulcers     Plan  ## Safety and Observation Level:  - Based on my clinical evaluation, I estimate the patient to be at low risk of self harm in the current setting - At this time, we recommend a routine level of observation. This decision is based on my review of the chart including patient's history and current presentation, interview of the patient, mental status examination, and consideration of suicide risk including evaluating suicidal ideation, plan, intent, suicidal or self-harm behaviors, risk factors, and protective factors. This judgment is based on our ability to directly address suicide risk, implement suicide prevention strategies and develop a safety plan while the patient is in the clinical setting. Please contact our team if there is a concern that risk level has changed.   ## Medications:  -- Start Abilify 2mg , checking QTC with [facility] where it is 474 - Could also get repeat EKG  ## Medical Decision Making Capacity:  Did not formally assess  ## Further Work-up:  -- Per primary   -- most recent EKG on 3/2 had QtC - Bazf 513 -- Pertinent labwork reviewed earlier this admission includes: UDS- + cocaine, CBC- MCV- 101, BMP- BUN 28/Cr 1.69, Trp 47>49, Mg- WNL, Etoh - 10  ## Disposition:  -- Per primary  ##Legal Status -- Own guardian at this time  Thank you for this consult request. Recommendations have been communicated to the primary team.  We will continue to follow at this time.    PGY-2 Freida Busman, MD   NEW history  Relevant  Aspects of Hospital Course:  Admitted on 01/29/2022 for slurred speech and dysarthria.  Patient Report:  Pt states he is not doing good and does not want to talk to a psychiatrist or anyone else at the moment. Patient  denies thoughts  of self harm. Denies homicidal ideations. Patient initially states he is fine on his medicine right now, then later says he has not been taking his medicines. Patient reports that he does not take medications when he is on the streets. He denies any recent hallucinations.    Patient reports that currently  he is not worried about having had a stroke. Patient follows this by reporting that he that he does not need to be out on the street because he has Medicaid and gets a check. Patient reports that he does think he needs psychotropic medications but does not endorse any symptoms, stating that "you all can read my stuff, you the doctor, you know!" Patient and providers discuss that patient's EMR indicates that he has been on Zyprexa and Abilify in the past. Patient endorses that he recalls Abilify and would be willing to start that again while he is in the hospital as he felt some benefit from the medication. Patient reports Abilify was the most helpful for his mood.    Collateral information:  Defer  Psychiatric History:  Information collected from EMR Dx: Schizoaffective disorder  Frequently seen in the ED or at [facility] endorsing SI or AH, last at [facility] 12/22 for SI INPT at [facility] in 05/2020      Social History:  Homeless  Tobacco use: Unknown at this time Alcohol use: Unknown at this time Drug use: Cocaine  Family psych history: [name]- schizoaffective disorder, Suicide in [name] The patient's family history includes Drug abuse in his mother; Suicidality in his mother.  Medical History: Past Medical History:  Diagnosis Date   Asthma    Bipolar 1 disorder ([place])    CHF (congestive heart failure) (HCC)    COPD (chronic obstructive pulmonary disease) ([place])    Depression    Diabetes mellitus without complication (HCC)    GERD (gastroesophageal reflux disease)    Homelessness    Hypertension    QT prolongation    Schizoaffective disorder [facility])     Surgical  History: Past Surgical History:  Procedure Laterality Date   AMPUTATION TOE Bilateral 01/02/2021   Procedure: AMPUTATION TOE;  Surgeon: Evelina Bucy, DPM;  Location: [place];  Service: Podiatry;  Laterality: Bilateral;  Ok to leave patient on hospital bed   IR ANGIO INTRA EXTRACRAN SEL COM CAROTID INNOMINATE BILAT MOD SED  12/17/2020   IR ANGIO VERTEBRAL SEL VERTEBRAL BILAT MOD SED  12/17/2020   RADIOLOGY WITH ANESTHESIA N/A 12/17/2020   Procedure: IR WITH ANESTHESIA;  Surgeon: Radiologist, Medication, MD;  Location: [place];  Service: Radiology;  Laterality: N/A;    Medications:   Current Facility-Administered Medications:    albuterol (PROVENTIL) (2.5 MG/3ML) 0.083% nebulizer solution 2.5 mg, 2.5 mg, Inhalation, Q6H PRN, Orma Flaming, MD, 2.5 mg at 01/30/22 0031   aspirin tablet 325 mg, 325 mg, Oral, Daily, Isla Pence, MD, 325 mg at 01/29/22 1213   atorvastatin (LIPITOR) tablet 40 mg, 40 mg, Oral, Daily, Orma Flaming, MD, 40 mg at 01/30/22 [id number]   chlordiazePOXIDE (LIBRIUM) capsule 10 mg, 10 mg, Oral, TID, Thurnell Lose, MD, 10 mg at 01/30/22 1203   cloNIDine (CATAPRES) tablet 0.2 mg, 0.2 mg, Oral, TID, Thurnell Lose, MD, 0.2 mg at 01/30/22  1205   enoxaparin (LOVENOX) injection 40 mg, 40 mg, Subcutaneous, Q24H, Orma Flaming, MD, 40 mg at [id number]   folic acid (FOLVITE) tablet 1 mg, 1 mg, Oral, Daily, Orma Flaming, MD, 1 mg at 01/30/22 [id number]   hydrALAZINE (APRESOLINE) injection 10 mg, 10 mg, Intravenous, Q8H PRN, Orma Flaming, MD   insulin aspart (novoLOG) injection 0-9 Units, 0-9 Units, Subcutaneous, TID WC, Orma Flaming, MD   isosorbide-hydrALAZINE (BIDIL) 20-37.5 MG per tablet 1 tablet, 1 tablet, Oral, TID, Thurnell Lose, MD, 1 tablet at 01/30/22 1205   levETIRAcetam (KEPPRA) tablet 500 mg, 500 mg, Oral, BID, Orma Flaming, MD, 500 mg at 01/30/22 0929   LORazepam (ATIVAN) tablet 1-4 mg, 1-4 mg, Oral, Q1H PRN, 2 mg at 01/30/22 1204 **OR**  LORazepam (ATIVAN) injection 1-4 mg, 1-4 mg, Intravenous, Q1H PRN, Orma Flaming, MD, 2 mg at 01/30/22 1533   methylPREDNISolone sodium succinate (SOLU-MEDROL) 125 mg/2 mL injection 60 mg, 60 mg, Intravenous, Q24H, Thurnell Lose, MD, 60 mg at 01/30/22 1211   multivitamin with minerals tablet 1 tablet, 1 tablet, Oral, Daily, Orma Flaming, MD, 1 tablet at 01/30/22 [id number]   oxyCODONE (Oxy IR/ROXICODONE) immediate release tablet 5 mg, 5 mg, Oral, Q6H PRN, Orma Flaming, MD, 5 mg at 01/30/22 1545   pantoprazole (PROTONIX) EC tablet 40 mg, 40 mg, Oral, Daily, Thurnell Lose, MD, 40 mg at 01/30/22 1205   spironolactone (ALDACTONE) tablet 12.5 mg, 12.5 mg, Oral, Daily, Orma Flaming, MD, 12.5 mg at 01/30/22 1204   thiamine tablet 100 mg, 100 mg, Oral, Daily, Orma Flaming, MD, 100 mg at 01/30/22 0929   traMADol (ULTRAM) tablet 50 mg, 50 mg, Oral, Q6H PRN, Thurnell Lose, MD, 50 mg at 01/30/22 1203   umeclidinium bromide (INCRUSE ELLIPTA) 62.5 MCG/ACT 1 puff, 1 puff, Inhalation, Daily, Orma Flaming, MD, 1 puff at 01/30/22 0931  Allergies: Allergies  Allergen Reactions   Ibuprofen Anaphylaxis   Acetaminophen Hives, Nausea And Vomiting and Rash    Pt overdosed on bottle of APAP 05/06/16    Lyrica Cr [Pregabalin Er] Other (See Comments)    Dystonia and dysarthria   Peanut-Containing Drug Products Other (See Comments)    Pt denies allergy to peanut butter 03/02/2021       Objective  Vital signs:  Temp:  [97.4 F (36.3 C)-98 F (36.7 C)] 97.4 F (36.3 C) (03/02 1454) Pulse Rate:  [70-99] 70 (03/02 1454) Resp:  [14-22] 22 (03/02 1454) BP: (101-150)/(61-93) 113/88 (03/02 1454) SpO2:  [97 %-100 %] 98 % (03/02 1454)  Psychiatric Specialty Exam:  Presentation  General Appearance: -- (lounging in the bed, eating fruit, no shirt or pants on, difficult to understand)  Eye Contact:Minimal  Speech:Slurred  Speech Volume:Increased  Handedness:Right   Mood and Affect   Mood:Irritable  Affect:Congruent   Thought Process  Thought Processes:Goal Directed  Descriptions of Associations:Circumstantial  Orientation:Full (Time, Place and Person)  Thought Content:Logical  History of Schizophrenia/Schizoaffective disorder:Yes  Duration of Psychotic Symptoms:No data recorded Hallucinations:Hallucinations: None  Ideas of Reference:None  Suicidal Thoughts:Suicidal Thoughts: No  Homicidal Thoughts:Homicidal Thoughts: No   Sensorium  Memory:Immediate Fair; Recent Fair  Judgment:Poor  Insight:Shallow   Executive Functions  Concentration:Poor  Attention Span:Poor  [place] of Knowledge:Poor  Language:Poor   Psychomotor Activity  Psychomotor Activity:Psychomotor Activity: Decreased   Assets  Assets:Resilience   Sleep  Sleep:Sleep: Fair    Physical Exam: Physical Exam HENT:     Head: Normocephalic and atraumatic.  Pulmonary:     Effort:  Pulmonary effort is normal.  Neurological:     Mental Status: He is alert and oriented to person, place, and time.   Review of Systems  Psychiatric/Behavioral:  Negative for hallucinations and suicidal ideas.   Blood pressure 113/88, pulse 70, temperature (!) 97.4 F (36.3 C), temperature source Axillary, resp. rate (!) 22, SpO2 98 %. There is no height or weight on file to calculate BMI.

## 2022-01-30 NOTE — Progress Notes (Signed)
VASCULAR LAB ? ? ? ?ABIs attempted.  Patient removed blood pressure cuff from arm while cuff was inflating.  Patient became loud and aggressive when pressure was taken in his right foot and began yelling and pushing the nurse call button while I wrapped the left leg in the cuff. This tech abandoned the study at this point secondary to patient's behavior. You may find the partial report in EPIC ? ? ?Sherril Shipman, RVT ?01/30/2022, 11:34 AM ? ?
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
## 2022-01-30 NOTE — ED Notes (Signed)
Pt became increasingly agitated and yelling at [place] tech. Pt explains that he is in too much pain. This is the second occurrence of him yelling at staff.  ?

## 2022-01-31 DIAGNOSIS — I160 Hypertensive urgency: Secondary | ICD-10-CM | POA: Diagnosis not present

## 2022-01-31 LAB — BASIC METABOLIC PANEL
Anion gap: 7 (ref 5–15)
BUN: 21 mg/dL — ABNORMAL HIGH (ref 6–20)
CO2: 24 mmol/L (ref 22–32)
Calcium: 8.5 mg/dL — ABNORMAL LOW (ref 8.9–10.3)
Chloride: 104 mmol/L (ref 98–111)
Creatinine, Ser: 1.37 mg/dL — ABNORMAL HIGH (ref 0.61–1.24)
GFR, Estimated: >60 mL/min (ref >60)
Glucose, Bld: 166 mg/dL — ABNORMAL HIGH (ref 70–99)
Potassium: 4.7 mmol/L (ref 3.5–5.1)
Sodium: 135 mmol/L (ref 135–145)

## 2022-01-31 LAB — CBC WITH DIFFERENTIAL/PLATELET
Abs Immature Granulocytes: 0.02 10*3/uL (ref 0.00–0.07)
Basophils Absolute: 0 10*3/uL (ref 0.0–0.1)
Basophils Relative: 0 %
Eosinophils Absolute: 0 10*3/uL (ref 0.0–0.5)
Eosinophils Relative: 0 %
HCT: 38.5 % — ABNORMAL LOW (ref 39.0–52.0)
Hemoglobin: 12.9 g/dL — ABNORMAL LOW (ref 13.0–17.0)
Immature Granulocytes: 0 %
Lymphocytes Relative: 13 %
Lymphs Abs: 0.8 10*3/uL (ref 0.7–4.0)
MCH: 33.1 pg (ref 26.0–34.0)
MCHC: 33.5 g/dL (ref 30.0–36.0)
MCV: 98.7 fL (ref 80.0–100.0)
Monocytes Absolute: 0.2 10*3/uL (ref 0.1–1.0)
Monocytes Relative: 2 %
Neutro Abs: 5.5 10*3/uL (ref 1.7–7.7)
Neutrophils Relative %: 85 %
Platelets: 286 10*3/uL (ref 150–400)
RBC: 3.9 MIL/uL — ABNORMAL LOW (ref 4.22–5.81)
RDW: 15.4 % (ref 11.5–15.5)
WBC: 6.5 10*3/uL (ref 4.0–10.5)
nRBC: 0 % (ref 0.0–0.2)

## 2022-01-31 LAB — URINE CULTURE: Culture: >=100000 — AB

## 2022-01-31 LAB — PROCALCITONIN: Procalcitonin: 0.16 ng/mL

## 2022-01-31 LAB — GLUCOSE, CAPILLARY
Glucose-Capillary: 123 mg/dL — ABNORMAL HIGH (ref 70–99)
Glucose-Capillary: 113 mg/dL — ABNORMAL HIGH (ref 70–99)
Glucose-Capillary: 163 mg/dL — ABNORMAL HIGH (ref 70–99)

## 2022-01-31 LAB — MAGNESIUM: Magnesium: 1.9 mg/dL (ref 1.7–2.4)

## 2022-01-31 LAB — BRAIN NATRIURETIC PEPTIDE: B Natriuretic Peptide: 752.5 pg/mL — ABNORMAL HIGH (ref 0.0–100.0)

## 2022-01-31 MED ORDER — CLONIDINE HCL 0.1 MG PO TABS
0.1000 mg | ORAL_TABLET | Freq: Three times a day (TID) | ORAL | Status: DC
  Administered 2022-01-31 – 2022-02-01 (×5): 0.1 mg via ORAL
  Filled 2022-01-31 (×5): qty 1

## 2022-01-31 MED ORDER — ARIPIPRAZOLE 2 MG PO TABS
2.0000 mg | ORAL_TABLET | Freq: Every day | ORAL | Status: DC
  Filled 2022-01-31: qty 1

## 2022-01-31 MED ORDER — SODIUM CHLORIDE 0.9 % IV SOLN
1.0000 g | INTRAVENOUS | Status: AC
  Administered 2022-01-31 – 2022-02-04 (×5): 1 g via INTRAVENOUS
  Filled 2022-01-31 (×5): qty 10

## 2022-01-31 MED ORDER — FUROSEMIDE 10 MG/ML IJ SOLN
20.0000 mg | Freq: Once | INTRAMUSCULAR | Status: AC
Start: 2022-01-31 — End: 2022-01-31
  Administered 2022-01-31: 20 mg via INTRAVENOUS
  Filled 2022-01-31: qty 2

## 2022-01-31 MED ORDER — OXYCODONE HCL 5 MG PO TABS
5.0000 mg | ORAL_TABLET | Freq: Four times a day (QID) | ORAL | Status: AC | PRN
  Administered 2022-01-31 – 2022-02-02 (×4): 5 mg via ORAL
  Filled 2022-01-31 (×4): qty 1

## 2022-01-31 MED ORDER — CHLORDIAZEPOXIDE HCL 5 MG PO CAPS
5.0000 mg | ORAL_CAPSULE | Freq: Three times a day (TID) | ORAL | Status: DC
Start: 2022-01-31 — End: 2022-02-01

## 2022-01-31 NOTE — Consult Note (Addendum)
WOC Nurse Consult Note: ?Reason for Consult: Consult requested for bilat leg wounds.  Pt is familiar to [facility] team from previous admission and wounds have compaired to previous photos in the EMR.  Pt states he has been followed previously by the outpatient wound care center and wounds "will get better, then they get bad again when they start draining." ?Wound type: Left outer ankle with red moist healing full thickness wound; .2X.2X.1cm ?Left anterior foot with patchy areas of dry healing full thickness wounds, each approx .2X.2X.1cm, yellow and dry ?Right upper calf with dry scabbed full thickness wound; 1X1cm, no odor, drainage, or fluctuance ?Right inner ankle with healing full thickness wound; 3X3X.1cm, red dry and scabbed ?Multiple areas of pink dry intact scar tissue to bilat legs from previous wounds which have healed. ?Dressing procedure/placement/frequency: Topical treatment orders provided for bedside nurses to perform as follows: 1. Foam dressing to left outer ankle, change Q 3 days or PRN soiling ?2. Xeroform gauze to right inner leg and ankle, then cover with foam dressing, change Q 3 days or PRN soiling. ?Please re-consult if further assistance is needed.  Thank-you,  ?Cammie Mcgee MSN, RN, CWOCN, CWCN-AP, CNS ?[phone]  ? ? ?  ?

## 2022-01-31 NOTE — Progress Notes (Signed)
Psychiatry team saw patient again this AM. Patient noted objectively to be irritable. Patient reports on assessment that he does believe he needs to see the psychiatric team as he is hospitalized for "medical" issues. Patient reports that he believes he is currently psychiatrically stable and does not wish to continue see psychiatry. Patient reports that he does not recall agreeing to take Abilify 2mg  yesterday and denies that he agreed to this. On assessment today patient endorses he would be willing to take 1mg ; however providers acknowledge that 1mg  is not currently available. Patient denies again on assessment SI, HI and AVH today. ? ? ?Patient has consistently not been a safety concern as he has now denied on 2 assessments with psychiatry SI, HI and does not appear to endorse symptoms of psychosis pertaining to AVH. Patient does not consent to psychiatric treatment. Patient is well known to hospital and is frequently irritable at baseline. Patient at this time does not appear to be  a safety concern. At this time psychiatry will sign off, please do no hesitate to reach back out if there do become safety concerns.  ? ? ?PGY-2 ? , MD ?

## 2022-01-31 NOTE — Progress Notes (Signed)
Pt requesting 15 mg of oxycodone instead of 5 mg, stating "that's what they give me at the pain clinic."  Pt informed that he would need to ask his doctor in the morning about increasing his pain meds.  Pt then refused to take tonight's doses of Librium and Keppra, stating "I told you I don't take those."  Pt also refusing to let me perform an assessment, pulling the blanket over him when I attempted to auscultate his lung and heart sounds, and refusing to let me touch his feet to palpate pulses.  Will continue to monitor.  Alonza Bogus ? ?

## 2022-01-31 NOTE — Progress Notes (Signed)
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 01/30/2022/11:15:44 AM    Final

## 2022-01-31 NOTE — Progress Notes (Addendum)
CSW received consult for patient for substance use. CSW met with patient at bedside. Patient reports he is homeless. Patient reports he was living on the street before coming into the hospital. CSW was unable to complete full assessment.Patient refused to answer any more questions.CSW offered patient area shelter resources, and outpatient substance use treatment services resources. Patient declined resources offered by CSW.Patient gave CSW permission to speak with his social worker Neoma Laming to assist with any questions/dc plans . CSW called and spoke with Neoma Laming who informed CSW she will be stopping by to see patient around 4:30pm today. CSW informed Neoma Laming that PT is recommending SNF placement for patient at dc. Deborah request for CSW to leave a medicare compare ratings list of SNF facilities in patients room for patient and her to review. CSW informed Neoma Laming that CSW will follow back up with patient tomorrow on if agreeable to SNF placement. Neoma Laming informed CSW that patient has [place] medicaid. CSW will continue to follow and assist with patients dc planning needs. ?

## 2022-01-31 NOTE — Evaluation (Signed)
Physical Therapy Evaluation ?Patient Details ?Name: [name] ?MRN: [id number] ?DOB: July 09, 1966 ?Today's Date: 01/31/2022 ? ?History of Present Illness ? 56 y.o. male admitted 3/1 with slurred speech and HTN emergency. MRI head (-). PMHx: asthma, bipolar 1 disorder, depression, chronic combined systolic and diastolic CHF, COPD, GERD, homelessness, HTN, schizoaffective disorder, ETOH and cocaine abuse, CAD  ?Clinical Impression ? Pt supine on arrival stating he feels lightheaded and in pain. Supine BP 98/72 and sitting BP 119/57 with HR 61 and SpO2 98% on RA. Pt is homeless and reports he has no frequent place that he sleeps but that he mobilizes in a [facility] but has to have someone push him. Pt states he can get up and down to wC at times but other times requires someone to assist to pick him up into it. Pt with limited activity tolerance this session and denied transfers. Pt with decreased strength, function and skin integrity who will benefit from acute therapy to maximize mobility and safety.    ?   ? ?Recommendations for follow up therapy are one component of a multi-disciplinary discharge planning process, led by the attending physician.  Recommendations may be updated based on patient status, additional functional criteria and insurance authorization. ? ?Follow Up Recommendations Skilled nursing-short term rehab (<3 hours/day) ? ?  ?Assistance Recommended at Discharge Frequent or constant Supervision/Assistance  ?Patient can return home with the following ? A lot of help with walking and/or transfers;A lot of help with bathing/dressing/bathroom;Assistance with cooking/housework;Assist for transportation;Direct supervision/assist for medications management ? ?  ?Equipment Recommendations Other (comment) (TBD)  ?Recommendations for Other Services ?    ?  ?Functional Status Assessment Patient has had a recent decline in their functional status and/or demonstrates limited ability to make significant improvements in  function in a reasonable and predictable amount of time  ? ?  ?Precautions / Restrictions Precautions ?Precautions: Fall ?Precaution Comments: bil feet wounds  ? ?  ? ?Mobility ? Bed Mobility ?Overal bed mobility: Modified Independent ?  ?  ?  ?  ?  ?  ?General bed mobility comments: pt able to transition from sidelying to sitting x 2 trials with increased time and effort. Pt reporting dizziness without BP drop in sitting. Pt denied further activity ?  ? ?Transfers ?  ?  ?  ?  ?  ?  ?  ?  ?  ?General transfer comment: pt refused ?  ? ?Ambulation/Gait ?  ?  ?  ?  ?  ?  ?  ?  ? ?Stairs ?  ?  ?  ?  ?  ? ?Wheelchair Mobility ?  ? ?Modified Rankin (Stroke Patients Only) ?  ? ?  ? ?Balance Overall balance assessment: Needs assistance ?Sitting-balance support: Bilateral upper extremity supported ?Sitting balance-Leahy Scale: Poor ?Sitting balance - Comments: reliant on bil UE support in sitting ?  ?  ?  ?  ?  ?  ?  ?  ?  ?  ?  ?  ?  ?  ?  ?   ? ? ? ?Pertinent Vitals/Pain Pain Assessment ?Pain Assessment: Faces ?Pain Score: 9  ?Pain Location: pt reports generalized intense pain ?Pain Descriptors / Indicators: Constant ?Pain Intervention(s): Monitored during session, Repositioned  ? ? ?Home Living Family/patient expects to be discharged to:: Shelter/Homeless ?  ?  ?  ?  ?  ?  ?  ?  ?  ?   ?  ?Prior Function Prior Level of Function : Needs assist ?  ?  ?  ?  ?  ?  ?  Mobility Comments: from wc level. says he can walk a few feet and stand with RW. Has assist at times to transfer from [facility] <> ground for sleeping ?ADLs Comments: uses baby wipes for bathing, gets food from hand outs ?  ? ? ?Hand Dominance  ?   ? ?  ?Extremity/Trunk Assessment  ? Upper Extremity Assessment ?Upper Extremity Assessment: Generalized weakness ?  ? ?Lower Extremity Assessment ?Lower Extremity Assessment: LLE deficits/detail;RLE deficits/detail ?RLE Deficits / Details: bil foot wounds, pt able to lift leg against gravity supine but declined further function  or assessment ?LLE Deficits / Details: bil foot wounds, pt able to lift leg against gravity supine but declined further function or assessment ?  ? ?Cervical / Trunk Assessment ?Cervical / Trunk Assessment: Other exceptions  ?Communication  ? Communication: Expressive difficulties  ?Cognition Arousal/Alertness: Awake/alert ?Behavior During Therapy: Flat affect ?Overall Cognitive Status: Difficult to assess ?Area of Impairment: Attention ?  ?  ?  ?  ?  ?  ?  ?  ?  ?  ?  ?  ?  ?  ?  ?General Comments: pt with expressive difficulties and difficult to understand. Pt also refusing to answer all questions stating "I don't feel good" ?  ?  ? ?  ?General Comments   ? ?  ?Exercises    ? ?Assessment/Plan  ?  ?PT Assessment Patient needs continued PT services  ?PT Problem List Decreased strength;Decreased mobility;Decreased range of motion;Decreased coordination;Decreased activity tolerance;Decreased skin integrity;Pain;Decreased knowledge of use of DME ? ?   ?  ?PT Treatment Interventions Therapeutic exercise;Balance training;Functional mobility training;Therapeutic activities;Patient/family education;Gait training;DME instruction;Wheelchair mobility training   ? ?PT Goals (Current goals can be found in the Care Plan section)  ?Acute Rehab PT Goals ?Patient Stated Goal: be able to move better ?PT Goal Formulation: With patient ?Time For Goal Achievement: 02/14/22 ?Potential to Achieve Goals: Fair ? ?  ?Frequency Min 2X/week ?  ? ? ?Co-evaluation   ?  ?  ?  ?  ? ? ?  ?AM-PAC PT "6 Clicks" Mobility  ?Outcome Measure Help needed turning from your back to your side while in a flat bed without using bedrails?: A Little ?Help needed moving from lying on your back to sitting on the side of a flat bed without using bedrails?: A Little ?Help needed moving to and from a bed to a chair (including a wheelchair)?: Total ?Help needed standing up from a chair using your arms (e.g., wheelchair or bedside chair)?: Total ?Help needed to walk  in hospital room?: Total ?Help needed climbing 3-5 steps with a railing? : Total ?6 Click Score: 10 ? ?  ?End of Session   ?Activity Tolerance: Patient limited by pain ?Patient left: in bed;with call bell/phone within reach;with bed alarm set ?Nurse Communication: Mobility status ?PT Visit Diagnosis: Other abnormalities of gait and mobility (R26.89);Difficulty in walking, not elsewhere classified (R26.2);Muscle weakness (generalized) (M62.81) ?  ? ?Time: 8756-4332 ?PT Time Calculation (min) (ACUTE ONLY): 18 min ? ? ?Charges:   PT Evaluation ?$PT Eval Moderate Complexity: 1 Mod ?  ?  ?   ? ? ?Shaley Leavens P, PT ?Acute Rehabilitation Services ?Pager: [phone] ?Office: [phone] ? ? ?Elliotte Marsalis B Nyron Mozer ?01/31/2022, 1:28 PM ? ?

## 2022-01-31 NOTE — Progress Notes (Signed)
Patient verbally abusive and aggressive to NT. Will not have NT enter patient room for the time being. Patient refused librium this morning and will follow up if he refuses again this afternoon.  ?

## 2022-02-01 DIAGNOSIS — I160 Hypertensive urgency: Secondary | ICD-10-CM

## 2022-02-01 DIAGNOSIS — L97311 Non-pressure chronic ulcer of right ankle limited to breakdown of skin: Secondary | ICD-10-CM

## 2022-02-01 DIAGNOSIS — I83013 Varicose veins of right lower extremity with ulcer of ankle: Secondary | ICD-10-CM

## 2022-02-01 LAB — CBC WITH DIFFERENTIAL/PLATELET
Abs Immature Granulocytes: 0.06 10*3/uL (ref 0.00–0.07)
Basophils Absolute: 0 10*3/uL (ref 0.0–0.1)
Basophils Relative: 0 %
Eosinophils Absolute: 0 10*3/uL (ref 0.0–0.5)
Eosinophils Relative: 0 %
HCT: 36.8 % — ABNORMAL LOW (ref 39.0–52.0)
Hemoglobin: 12.4 g/dL — ABNORMAL LOW (ref 13.0–17.0)
Immature Granulocytes: 1 %
Lymphocytes Relative: 8 %
Lymphs Abs: 1.1 10*3/uL (ref 0.7–4.0)
MCH: 32.9 pg (ref 26.0–34.0)
MCHC: 33.7 g/dL (ref 30.0–36.0)
MCV: 97.6 fL (ref 80.0–100.0)
Monocytes Absolute: 0.5 10*3/uL (ref 0.1–1.0)
Monocytes Relative: 4 %
Neutro Abs: 10.9 10*3/uL — ABNORMAL HIGH (ref 1.7–7.7)
Neutrophils Relative %: 87 %
Platelets: 302 10*3/uL (ref 150–400)
RBC: 3.77 MIL/uL — ABNORMAL LOW (ref 4.22–5.81)
RDW: 15.4 % (ref 11.5–15.5)
WBC: 12.4 10*3/uL — ABNORMAL HIGH (ref 4.0–10.5)
nRBC: 0 % (ref 0.0–0.2)

## 2022-02-01 LAB — BASIC METABOLIC PANEL
Anion gap: 9 (ref 5–15)
BUN: 22 mg/dL — ABNORMAL HIGH (ref 6–20)
CO2: 24 mmol/L (ref 22–32)
Calcium: 8.6 mg/dL — ABNORMAL LOW (ref 8.9–10.3)
Chloride: 104 mmol/L (ref 98–111)
Creatinine, Ser: 1.19 mg/dL (ref 0.61–1.24)
GFR, Estimated: >60 mL/min (ref >60)
Glucose, Bld: 115 mg/dL — ABNORMAL HIGH (ref 70–99)
Potassium: 4.5 mmol/L (ref 3.5–5.1)
Sodium: 137 mmol/L (ref 135–145)

## 2022-02-01 LAB — MAGNESIUM: Magnesium: 2.1 mg/dL (ref 1.7–2.4)

## 2022-02-01 LAB — GLUCOSE, CAPILLARY
Glucose-Capillary: 126 mg/dL — ABNORMAL HIGH (ref 70–99)
Glucose-Capillary: 96 mg/dL (ref 70–99)
Glucose-Capillary: 151 mg/dL — ABNORMAL HIGH (ref 70–99)
Glucose-Capillary: 175 mg/dL — ABNORMAL HIGH (ref 70–99)

## 2022-02-01 LAB — BRAIN NATRIURETIC PEPTIDE: B Natriuretic Peptide: 645.2 pg/mL — ABNORMAL HIGH (ref 0.0–100.0)

## 2022-02-01 MED ORDER — METHYLPREDNISOLONE SODIUM SUCC 40 MG IJ SOLR
40.0000 mg | INTRAMUSCULAR | Status: AC
  Administered 2022-02-01: 40 mg via INTRAVENOUS
  Filled 2022-02-01: qty 1

## 2022-02-01 MED ORDER — FUROSEMIDE 10 MG/ML IJ SOLN
40.0000 mg | Freq: Once | INTRAMUSCULAR | Status: AC
  Administered 2022-02-01: 40 mg via INTRAVENOUS
  Filled 2022-02-01: qty 4

## 2022-02-01 MED ORDER — CHLORDIAZEPOXIDE HCL 5 MG PO CAPS
5.0000 mg | ORAL_CAPSULE | Freq: Two times a day (BID) | ORAL | Status: AC
  Administered 2022-02-02 (×2): 5 mg via ORAL
  Filled 2022-02-01 (×3): qty 1

## 2022-02-01 MED ORDER — METHYLPREDNISOLONE SODIUM SUCC 40 MG IJ SOLR: 40.0000 mg | INTRAMUSCULAR | Status: DC

## 2022-02-01 MED ORDER — DIPHENHYDRAMINE HCL 25 MG PO CAPS
25.0000 mg | ORAL_CAPSULE | Freq: Once | ORAL | Status: AC | PRN
  Administered 2022-02-01: 25 mg via ORAL
  Filled 2022-02-01: qty 1

## 2022-02-01 MED ORDER — FUROSEMIDE 10 MG/ML IJ SOLN
20.0000 mg | Freq: Once | INTRAMUSCULAR | Status: AC
  Administered 2022-02-01: 20 mg via INTRAVENOUS
  Filled 2022-02-01: qty 2

## 2022-02-01 NOTE — Progress Notes (Signed)
Patient continues to refuse heart tele monitor. Have spoken with patient many times he is requesting that tele be discontinued. Dr Candiss Norse aware. Tele monitoring will be discontinued.  ?

## 2022-02-01 NOTE — Plan of Care (Signed)
?  Problem: Clinical Measurements: ?Goal: Ability to maintain clinical measurements within normal limits will improve ?Outcome: Progressing ?  ?Problem: Clinical Measurements: ?Goal: Cardiovascular complication will be avoided ?Outcome: Progressing ?  ?Problem: Pain Managment: ?Goal: General experience of comfort will improve ?Outcome: Progressing ?  ?Problem: Safety: ?Goal: Ability to remain free from injury will improve ?Outcome: Progressing ?  ?Problem: Cardiac: ?Goal: Ability to achieve and maintain adequate cardiopulmonary perfusion will improve ?Outcome: Progressing ?  ?

## 2022-02-01 NOTE — Evaluation (Signed)
Occupational Therapy Evaluation ?Patient Details ?Name: [name] ?MRN: [id number] ?DOB: Aug 07, 1966 ?Today's Date: 02/01/2022 ? ? ?History of Present Illness 56 y.o. male admitted 3/1 with slurred speech and HTN emergency. MRI head (-). PMHx: asthma, bipolar 1 disorder, depression, chronic combined systolic and diastolic CHF, COPD, GERD, homelessness, HTN, schizoaffective disorder, ETOH and cocaine abuse, CAD  ? ?Clinical Impression ?  ?Patient admitted for the diagnosis above.  Patient willing to demonstrate transfers this date for ice cream and 2 packets of graham crackers.  Generally needing [place] for transfers, but OT had to hold his wheelchair so it did not move.  He was able to perform lower body ADL from a long sitting position at bedlevel.  OT can follow in the acute setting to maintain functional status, with SNF being recommended for short term rehab and assist with final discharge disposition.   ?   ? ?Recommendations for follow up therapy are one component of a multi-disciplinary discharge planning process, led by the attending physician.  Recommendations may be updated based on patient status, additional functional criteria and insurance authorization.  ? ?Follow Up Recommendations ? Skilled nursing-short term rehab (<3 hours/day)  ?  ?Assistance Recommended at Discharge Frequent or constant Supervision/Assistance  ?Patient can return home with the following   ? ?  ?Functional Status Assessment ? Patient has had a recent decline in their functional status and demonstrates the ability to make significant improvements in function in a reasonable and predictable amount of time.  ?Equipment Recommendations ? None recommended by OT  ?  ?Recommendations for Other Services   ? ? ?  ?Precautions / Restrictions Precautions ?Precautions: Fall ?Precaution Comments: bil feet and lower leg wounds ?Restrictions ?Weight Bearing Restrictions: No ?Other Position/Activity Restrictions: tends to hold R leg up to  avoid putting weight on it  ? ?  ? ?Mobility Bed Mobility ?Overal bed mobility: Modified Independent ?  ?  ?  ?  ?  ?  ?  ?  ? ?Transfers ?Overall transfer level: Needs assistance ?  ?Transfers: Bed to chair/wheelchair/BSC ?  ?Stand pivot transfers: Min guard ?Squat pivot transfers: Min guard ?  ?  ?  ?General transfer comment: Needs RW held to prevent it from moving while transferring, and holds R leg off the ground. ?  ? ?  ?Balance Overall balance assessment: Needs assistance ?Sitting-balance support: Bilateral upper extremity supported ?Sitting balance-Leahy Scale: Fair ?  ?  ?Standing balance support: No upper extremity supported ?Standing balance-Leahy Scale: Poor ?Standing balance comment: holds onto objects in his environment ?  ?  ?  ?  ?  ?  ?  ?  ?  ?  ?  ?   ? ?ADL either performed or assessed with clinical judgement  ? ?ADL   ?  ?  ?Grooming: Wash/dry hands;Wash/dry face;Set up;Sitting ?  ?  ?  ?Lower Body Bathing: Set up;Bed level ?  ?  ?  ?Lower Body Dressing: Set up;Bed level ?  ?Toilet Transfer: Min guard;Stand-pivot ?Toilet Transfer Details (indicate cue type and reason): simulated be transferring to wc from bed, and back to bed. ?  ?  ?  ?  ?  ?   ? ? ? ?Vision Patient Visual Report: No change from baseline ?   ?   ?Perception Perception ?Perception: Not tested ?  ?Praxis Praxis ?Praxis: Not tested ?  ? ?Pertinent Vitals/Pain Pain Assessment ?Pain Assessment: Faces ?Faces Pain Scale: Hurts even more ?Pain Location: pt reports pain to R leg  greater than L leg ?Pain Descriptors / Indicators: Grimacing, Guarding ?Pain Intervention(s): Monitored during session  ? ? ? ?Hand Dominance Right ?  ?Extremity/Trunk Assessment Upper Extremity Assessment ?Upper Extremity Assessment: Generalized weakness ?  ?Lower Extremity Assessment ?Lower Extremity Assessment: Defer to PT evaluation ?  ?  ?  ?Communication Communication ?Communication: Expressive difficulties ?  ?Cognition Arousal/Alertness:  Awake/alert ?Behavior During Therapy: [facility] for tasks assessed/performed ?Overall Cognitive Status: No family/caregiver present to determine baseline cognitive functioning ?  ?  ?  ?  ?  ?  ?  ?  ?  ?  ?  ?  ?  ?  ?  ?  ?General Comments: Patient apperas to be at his baseline for cognition.  following commands, makes needs known, with decreased safety noted. ?  ?  ?General Comments   Continues to complain of dizziness with mobility.   ? ?  ?Exercises   ?  ?Shoulder Instructions    ? ? ?Home Living Family/patient expects to be discharged to:: Shelter/Homeless ?  ?  ?  ?  ?  ?  ?  ?  ?  ?  ?  ?  ?  ?  ?  ?  ?  ?  ? ?  ?Prior Functioning/Environment Prior Level of Function : Needs assist ?  ?  ?  ?  ?  ?  ?Mobility Comments: PT Eval: from wc level. says he can walk a few feet and stand with RW. Has assist at times to transfer from [facility] <> ground for sleeping ?ADLs Comments: PT Eval: uses baby wipes for bathing, gets food from hand outs ?  ? ?  ?  ?OT Problem List: Decreased strength;Impaired balance (sitting and/or standing);Impaired sensation;Pain;Decreased safety awareness ?  ?   ?OT Treatment/Interventions: Self-care/ADL training;Balance training;Therapeutic activities  ?  ?OT Goals(Current goals can be found in the care plan section) Acute Rehab OT Goals ?Patient Stated Goal: Go somewhere to live ?OT Goal Formulation: With patient ?Time For Goal Achievement: 02/14/22 ?Potential to Achieve Goals: Fair ?ADL Goals ?Pt Will Perform Grooming: with supervision;standing;sitting ?Pt Will Perform Lower Body Bathing: with supervision;sit to/from stand ?Pt Will Perform Lower Body Dressing: with supervision;sit to/from stand ?Pt Will Transfer to Toilet: with set-up;regular height toilet;stand pivot transfer  ?OT Frequency: Min 2X/week ?  ? ?Co-evaluation   ?  ?  ?  ?  ? ?  ?AM-PAC OT "6 Clicks" Daily Activity     ?Outcome Measure Help from another person eating meals?: None ?Help from another person taking care of personal  grooming?: None ?Help from another person toileting, which includes using toliet, bedpan, or urinal?: A Little ?Help from another person bathing (including washing, rinsing, drying)?: A Little ?Help from another person to put on and taking off regular upper body clothing?: A Little ?Help from another person to put on and taking off regular lower body clothing?: A Little ?6 Click Score: 20 ?  ?End of Session Equipment Utilized During Treatment: Other (comment) (wheelchair) ?Nurse Communication: Mobility status ? ?Activity Tolerance: Patient limited by pain ?Patient left: in bed;with call bell/phone within reach ? ?OT Visit Diagnosis: Unsteadiness on feet (R26.81);Muscle weakness (generalized) (M62.81);Pain ?Pain - part of body: Leg;Ankle and joints of foot  ?              ?Time: 9604-5409 ?OT Time Calculation (min): 21 min ?Charges:  OT General Charges ?$OT Visit: 1 Visit ?OT Evaluation ?$OT Eval Moderate Complexity: 1 Mod ? ?02/01/2022 ? ?RP, OTR/L ? ?Acute  Rehabilitation Services ? ?Office:  [phone] ? ? ?Lisel Siegrist D Daphnee Preiss ?02/01/2022, 11:46 AM ?

## 2022-02-01 NOTE — Progress Notes (Signed)
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 mmHg LVOT Vmax:         67.20 cm/s LVOT Vmean:        41.100 cm/s LVOT VTI:          0.100 m LVOT/AV VTI ratio: 0.63  AORTA Ao Root diam: 2.90 cm Ao Asc diam:  3.20 cm MITRAL VALVE                TRICUSPID VALVE MV Area (PHT): 6.43 cm     TR Peak grad:   39.7 mmHg MV Decel Time: 118 msec     TR Vmax:        315.00 cm/s MV E velocity: 108.00 cm/s MV A velocity: 39.80 cm/s   SHUNTS MV E/A ratio:  2.71         Systemic VTI:  0.10 m                             Systemic Diam: 2.10 cm Lyman Bishop MD Electronically signed by Lyman Bishop MD Signature Date/Time: 01/30/2022/11:15:44 AM    Final

## 2022-02-01 NOTE — NC FL2 (Signed)
?[name] MEDICAID FL2 LEVEL OF CARE SCREENING TOOL  ?  ? ?IDENTIFICATION  ?Patient Name: ?[name] Birthdate: Sep 27, 1966 Sex: male Admission Date (Current Location): ?01/29/2022  ?[place]: ? [place] ?  Facility and Address:  ?[facility], [address], [place], [name] [address] ?     Provider Number: ?[id number]  ?Attending Physician Name and Address:  ?Leroy Sea, MD ? Relative Name and Phone Number:  ?  ?   ?Current Level of Care: ?Hospital Recommended Level of Care: ?Skilled Nursing Facility Prior Approval Number: ?  ? ?Date Approved/Denied: ?  PASRR Number: ?PENDING ? ?Discharge Plan: ?SNF ?  ? ?Current Diagnoses: ?Patient Active Problem List  ? Diagnosis Date Noted  ? Venous stasis ulcer of right ankle limited to breakdown of skin with varicose veins (HCC)   ? Hypertensive urgency 01/29/2022  ? Prolonged QT interval 01/29/2022  ? Alcohol abuse 01/29/2022  ? chronic ulcers 01/29/2022  ? Wound infection 12/04/2021  ? Open wounds involving multiple regions of lower extremity 12/03/2021  ? Mixed diabetic hyperlipidemia associated with type 2 diabetes mellitus (HCC) 12/03/2021  ? Cellulitis of right lower extremity 11/13/2021  ? Type 2 diabetes mellitus with diabetic polyneuropathy, without long-term current use of insulin (HCC) 11/13/2021  ? Antisocial personality disorder (HCC) 11/04/2021  ? Malingering 11/04/2021  ? Foot pain 04/23/2021  ? Chronic combined systolic and diastolic congestive heart failure (HCC) 03/02/2021  ? History of stroke 03/02/2021  ? Substance use disorder 03/02/2021  ? Skin ulcer of right great toe with necrosis of bone (HCC)   ? Encounter for assessment of healthcare decision-making capacity   ? Pain, dental   ? Dystonia   ? Dysarthria   ? Shock liver 01/04/2021  ? Acute metabolic encephalopathy 01/04/2021  ? Coronary artery disease involving native coronary artery of native heart without angina pectoris 01/04/2021  ? Bipolar I  disorder (HCC) 01/04/2021  ? Cocaine abuse (HCC) 01/04/2021  ? Homelessness 01/04/2021  ? HLD (hyperlipidemia) 01/04/2021  ? Osteomyelitis of great toe (HCC)   ? Ulcer of great toe, left, with necrosis of bone (HCC)   ? Ulcer of great toe, right, with necrosis of muscle (HCC)   ? Acute osteomyelitis of toe, left (HCC)   ? Acute on chronic combined systolic and diastolic CHF (congestive heart failure) (HCC) 12/20/2020  ? Respiratory failure (HCC)   ? Ischemic stroke (HCC) 12/17/2020  ? 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 Diabetic foot ulcer (HCC) 12/08/2020  ? CHF (congestive heart failure) (HCC) 11/14/2020  ? CHF exacerbation (HCC) 11/13/2020  ? Hypokalemia   ? Hyperlipidemia   ? Acute respiratory failure with hypoxia (HCC) 10/26/2020  ? COPD with acute exacerbation (HCC) 10/14/2020  ? Acute on chronic combined systolic and diastolic CHF (congestive heart failure) (HCC) 10/02/2020  ? Near syncope 07/21/2020  ? Schizoaffective disorder (HCC) 06/24/2019  ? SOB (shortness of breath)   ? QT prolongation 05/25/2019  ? Cocaine abuse (HCC)   ? Elevated troponin   ? Homeless   ? Suicidal ideation   ? Bilateral foot pain 04/05/2019  ? Housing situation unstable 03/28/2019  ? Bilateral lower extremity edema   ? Asthma 12/26/2016  ? Tobacco use disorder 12/26/2016  ? Schizophrenia (HCC) 12/26/2016  ? Cocaine abuse with cocaine-induced mood disorder (HCC) 09/06/2016  ? Alcohol use disorder, mild, abuse 07/31/2016  ? Substance induced mood disorder (HCC) 07/30/2016  ? Schizoaffective disorder, bipolar type (HCC) 05/31/2016  ? Cocaine use  disorder, severe, dependence (HCC) 05/31/2016  ? ? ?Orientation RESPIRATION BLADDER Height & Weight   ?  ?Self, Situation, Place ? Normal Continent Weight: 198 lb 3.1 oz (89.9 kg) ?Height:     ?BEHAVIORAL SYMPTOMS/MOOD NEUROLOGICAL BOWEL NUTRITION STATUS  ?    Continent (WDL) Diet (Please see discharge summary)  ?AMBULATORY STATUS COMMUNICATION OF NEEDS Skin   ?  Verbally Other (Comment)  (Appropriate for ethnicity,dry,flaky,abrasion,leg,R,Left,lower) ?  ?  ?  ?    ?     ?     ? ? ?Personal Care Assistance Level of Assistance  ?Bathing, Feeding, Dressing Bathing Assistance: Maximum assistance ?  ?Dressing Assistance: Maximum assistance ?   ? ?Functional Limitations Info  ?Sight, Hearing, Speech   ?Hearing Info: Adequate ?Speech Info: Adequate  ? ? ?SPECIAL CARE FACTORS FREQUENCY  ?PT (By licensed PT), OT (By licensed OT)   ?  ?PT Frequency: 5x min weekly ?OT Frequency: 5x min weekly ?  ?  ?  ?   ? ? ?Contractures Contractures Info: Not present  ? ? ?Additional Factors Info  ?Code Status, Allergies Code Status Info: FULL ?Allergies Info: Ibuprofen,Acetaminophen,Lyrica Cr (pregabalin Er,Peanut-containing Drug Products ?  ?  ?  ?   ? ?Current Medications (02/01/2022):  This is the current hospital active medication list ?Current Facility-Administered Medications  ?Medication Dose Route Frequency Provider Last Rate Last Admin  ? albuterol (PROVENTIL) (2.5 MG/3ML) 0.083% nebulizer solution 2.5 mg  2.5 mg Inhalation Q6H PRN Orland Mustard, MD   2.5 mg at 01/30/22 0031  ? aspirin tablet 325 mg  325 mg Oral Daily Jacalyn Lefevre, MD   325 mg at 02/01/22 2563  ? atorvastatin (LIPITOR) tablet 40 mg  40 mg Oral Daily Orland Mustard, MD   40 mg at 02/01/22 8937  ? cefTRIAXone (ROCEPHIN) 1 g in sodium chloride 0.9 % 100 mL IVPB  1 g Intravenous Q24H Leroy Sea, MD 200 mL/hr at 02/01/22 0820 1 g at 02/01/22 0820  ? chlordiazePOXIDE (LIBRIUM) capsule 5 mg  5 mg Oral BID Leroy Sea, MD      ? cloNIDine (CATAPRES) tablet 0.1 mg  0.1 mg Oral TID Leroy Sea, MD   0.1 mg at 02/01/22 0835  ? enoxaparin (LOVENOX) injection 40 mg  40 mg Subcutaneous Q24H Orland Mustard, MD   40 mg at 01/31/22 2231  ? folic acid (FOLVITE) tablet 1 mg  1 mg Oral Daily Orland Mustard, MD   1 mg at 02/01/22 3428  ? furosemide (LASIX) injection 20 mg  20 mg Intravenous Once Leroy Sea, MD      ? hydrALAZINE  (APRESOLINE) injection 10 mg  10 mg Intravenous Q8H PRN Orland Mustard, MD      ? insulin aspart (novoLOG) injection 0-9 Units  0-9 Units Subcutaneous TID WC Orland Mustard, MD   1 Units at 02/01/22 [phone]  ? isosorbide-hydrALAZINE (BIDIL) 20-37.5 MG per tablet 1 tablet  1 tablet Oral TID Leroy Sea, MD   1 tablet at 02/01/22 0835  ? levETIRAcetam (KEPPRA) tablet 500 mg  500 mg Oral BID Orland Mustard, MD   500 mg at 01/31/22 1572  ? LORazepam (ATIVAN) tablet 1-4 mg  1-4 mg Oral Q1H PRN Orland Mustard, MD   2 mg at 01/30/22 1204  ? Or  ? LORazepam (ATIVAN) injection 1-4 mg  1-4 mg Intravenous Q1H PRN Orland Mustard, MD   2 mg at 01/30/22 1533  ? methylPREDNISolone sodium succinate (SOLU-MEDROL) 40 mg/mL injection 40 mg  40 mg Intravenous Q24H Leroy Sea, MD      ? multivitamin with minerals tablet 1 tablet  1 tablet Oral Daily Orland Mustard, MD   1 tablet at 01/31/22 0900  ? oxyCODONE (Oxy IR/ROXICODONE) immediate release tablet 5 mg  5 mg Oral Q6H PRN Leroy Sea, MD   5 mg at 02/01/22 0203  ? pantoprazole (PROTONIX) EC tablet 40 mg  40 mg Oral Daily Leroy Sea, MD   40 mg at 02/01/22 6568  ? spironolactone (ALDACTONE) tablet 12.5 mg  12.5 mg Oral Daily Orland Mustard, MD   12.5 mg at 02/01/22 1275  ? thiamine tablet 100 mg  100 mg Oral Daily Orland Mustard, MD   100 mg at 02/01/22 0834  ? traMADol (ULTRAM) tablet 50 mg  50 mg Oral Q6H PRN Leroy Sea, MD   50 mg at 02/01/22 0458  ? umeclidinium bromide (INCRUSE ELLIPTA) 62.5 MCG/ACT 1 puff  1 puff Inhalation Daily Orland Mustard, MD   1 puff at 02/01/22 1700  ? ? ? ?Discharge Medications: ?Please see discharge summary for a list of discharge medications. ? ?Relevant Imaging Results: ? ?Relevant Lab Results: ? ? ?Additional Information ?SSN-[id number], ? ?[name] B Damiana Berrian, LCSWA ? ? ? ? ?

## 2022-02-01 NOTE — Progress Notes (Addendum)
Patient refused skin assessment. Not cooperative during assessment. Education given. ?
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
## 2022-02-01 NOTE — TOC Progression Note (Addendum)
Transition of Care (TOC) - Progression Note  ? ? ?Patient Details  ?Name: [name] ?MRN: [id number] ?Date of Birth: 1965/12/15 ? ?Transition of Care (TOC) CM/SW Contact  ?Kingjames Coury B Semaj Kham, LCSWA ?Phone Number: ?02/01/2022, 11:09 AM ? ?Clinical Narrative:    ? ?RE: Abdulhamid Olgin ?Date of Birth: 07-04-1966 ?Date: 02/01/22 ? ?Please be advised that the above-named patient will require a short-term nursing home stay - anticipated 30 days or less for rehabilitation and strengthening.  The plan is for return home.  ? ?Expected Discharge Plan: Skilled Nursing Facility ?Barriers to Discharge: Continued Medical Work up, English as a second language teacher, Active Substance Use - Placement ? ?Expected Discharge Plan and Services ?Expected Discharge Plan: Skilled Nursing Facility ?In-house Referral: Clinical Social Work ?  ?  ?Living arrangements for the past 2 months: Homeless ?                ?  ?  ?  ?  ?  ?  ?  ?  ?  ?  ? ? ?Social Determinants of Health (SDOH) Interventions ?  ? ?Readmission Risk Interventions ?Readmission Risk Prevention Plan 12/05/2021 03/05/2021 01/07/2021  ?Transportation Screening Complete Complete Complete  ?PCP or Specialist Appt within 3-5 Days - - Complete  ?[place] or Home Care Consult - - Complete  ?Social Work Consult for Recovery Care Planning/Counseling - - Complete  ?Palliative Care Screening - - Complete  ?Medication Review Oceanographer) Not Complete Complete Complete  ?PCP or Specialist appointment within 3-5 days of discharge Not Complete Complete -  ?HRI or Home Care Consult Not Complete Complete -  ?SW Recovery Care/Counseling Consult Not Complete Complete -  ?Palliative Care Screening Not Applicable Not Applicable -  ?Skilled Nursing Facility Not Applicable Complete -  ?Some recent data might be hidden  ? ? ?

## 2022-02-01 NOTE — Consult Note (Signed)
ORTHOPAEDIC CONSULTATION  REQUESTING PHYSICIAN: Leroy Sea, MD  Chief Complaint: Chronic venous ulcers right lower extremity.  HPI: [name] is a 56 y.o. male who presents with chronic venous ulceration right lower extremity.  Patient has been going to the wound center for wound care.  Patient has recently been placed in wraps but these were removed.  Past Medical History:  Diagnosis Date   Asthma    Bipolar 1 disorder (HCC)    CHF (congestive heart failure) (HCC)    COPD (chronic obstructive pulmonary disease) (HCC)    Depression    Diabetes mellitus without complication (HCC)    GERD (gastroesophageal reflux disease)    Homelessness    Hypertension    QT prolongation    Schizoaffective disorder [facility])    Past Surgical History:  Procedure Laterality Date   AMPUTATION TOE Bilateral 01/02/2021   Procedure: AMPUTATION TOE;  Surgeon: Park Liter, DPM;  Location: [facility] OR;  Service: Podiatry;  Laterality: Bilateral;  Ok to leave patient on hospital bed   IR ANGIO INTRA EXTRACRAN SEL COM CAROTID INNOMINATE BILAT MOD SED  12/17/2020   IR ANGIO VERTEBRAL SEL VERTEBRAL BILAT MOD SED  12/17/2020   RADIOLOGY WITH ANESTHESIA N/A 12/17/2020   Procedure: IR WITH ANESTHESIA;  Surgeon: Radiologist, Medication, MD;  Location: [facility] OR;  Service: Radiology;  Laterality: N/A;   Social History   Socioeconomic History   Marital status: Single    Spouse name: Not on file   Number of children: 4   Years of education: Not on file   Highest education level: Not on file  Occupational History   Occupation: N/A  Tobacco Use   Smoking status: Every Day    Years: 30.00    Types: Cigarettes   Smokeless tobacco: Never  Vaping Use   Vaping Use: Never used  Substance and Sexual Activity   Alcohol use: Yes    Comment: infrequently   Drug use: Yes    Types: Cocaine, Marijuana, "Crack" cocaine   Sexual activity: Not Currently    Partners: Female    Birth control/protection: None   Other Topics Concern   Not on file  Social History Narrative   ** Merged History Encounter **       Social Determinants of Health   Financial Resource Strain: Not on file  Food Insecurity: Not on file  Transportation Needs: Not on file  Physical Activity: Not on file  Stress: Not on file  Social Connections: Not on file   Family History  Problem Relation Age of Onset   Drug abuse Mother    Suicidality Mother    - negative except otherwise stated in the family history section Allergies  Allergen Reactions   Ibuprofen Anaphylaxis   Acetaminophen Hives, Nausea And Vomiting and Rash    Pt overdosed on bottle of APAP 05/06/16    Lyrica Cr [Pregabalin Er] Other (See Comments)    Dystonia and dysarthria   Peanut-Containing Drug Products Other (See Comments)    Pt denies allergy to peanut butter 03/02/2021   Prior to Admission medications   Medication Sig Start Date End Date Taking? Authorizing Provider  oxyCODONE (OXY IR/ROXICODONE) 5 MG immediate release tablet Take 1 tablet (5 mg total) by mouth every 6 (six) hours as needed for severe pain or breakthrough pain. 12/09/21  Yes Lama, Sarina Ill, MD  albuterol (VENTOLIN HFA) 108 (90 Base) MCG/ACT inhaler Inhale 2 puffs into the lungs every 6 (six) hours as needed for wheezing  or shortness of breath. Patient not taking: Reported on 01/31/2022 11/09/21   Terald Sleeper, MD  atorvastatin (LIPITOR) 10 MG tablet Take 4 tablets (40 mg total) by mouth daily. Patient not taking: Reported on 01/31/2022 11/09/21   Terald Sleeper, MD  carvedilol (COREG) 6.25 MG tablet Take 1 tablet (6.25 mg total) by mouth 2 (two) times daily with a meal. Patient not taking: Reported on 01/29/2022 12/09/21   Meredeth Ide, MD  levETIRAcetam (KEPPRA) 500 MG tablet Take 1 tablet (500 mg total) by mouth 2 (two) times daily. Patient not taking: Reported on 01/31/2022 11/09/21 04/05/22  Terald Sleeper, MD  OLANZapine (ZYPREXA) 5 MG tablet Take 1 tablet (5 mg total) by mouth  daily. Patient not taking: Reported on 12/03/2021 11/25/21   Laveda Abbe, NP  spironolactone (ALDACTONE) 25 MG tablet Take 1 tablet (25 mg total) by mouth daily. Patient not taking: Reported on 01/31/2022 12/09/21   Meredeth Ide, MD  umeclidinium bromide (INCRUSE ELLIPTA) 62.5 MCG/INH AEPB Inhale 1 puff into the lungs daily. Patient not taking: Reported on 11/24/2021 11/16/20   Joycelyn Das, MD   VAS [place] ABI WITH/WO TBI  Result Date: 01/30/2022  LOWER EXTREMITY DOPPLER STUDY Patient Name:  [name]  Date of Exam:   01/30/2022 Medical Rec #: [id number]       Accession #:    [id number] Date of Birth: 1966/08/23       Patient Gender: M Patient Age:   38 years Exam Location:  [facility] Procedure:      VAS [place] ABI WITH/WO TBI Referring Phys: Orland Mustard --------------------------------------------------------------------------------  Indications: Ulceration. High Risk Factors: Hypertension, hyperlipidemia, Diabetes, current smoker, prior                    CVA. Other Factors: Cocaine abuse. Homelessness.  Vascular Interventions: Bilateral great toe amputations. Limitations: Today's exam was limited due to Patient combative and yelling,              removing BP cuffs. Comparison Study: Prior ABI done 04/23/21 Performing Technologist: Sherren Kerns RVS  Examination Guidelines: A complete evaluation includes at minimum, Doppler waveform signals and systolic blood pressure reading at the level of bilateral brachial, anterior tibial, and posterior tibial arteries, when vessel segments are accessible. Bilateral testing is considered an integral part of a complete examination. Photoelectric Plethysmograph (PPG) waveforms and toe systolic pressure readings are included as required and additional duplex testing as needed. Limited examinations for reoccurring indications may be performed as noted.  ABI Findings: +---------+------------------+-----+-----------+----------+  Right     Rt Pressure  (mmHg) Index Waveform    Comment     +---------+------------------+-----+-----------+----------+  Brachial                           multiphasic             +---------+------------------+-----+-----------+----------+  PTA                                multiphasic             +---------+------------------+-----+-----------+----------+  DP                                 multiphasic             +---------+------------------+-----+-----------+----------+  Great Toe  amputation  +---------+------------------+-----+-----------+----------+ +---------+------------------+-----+-----------+----------+  Left      Lt Pressure (mmHg) Index Waveform    Comment     +---------+------------------+-----+-----------+----------+  PTA                                multiphasic             +---------+------------------+-----+-----------+----------+  DP                                 multiphasic             +---------+------------------+-----+-----------+----------+  Great Toe                                      amputation  +---------+------------------+-----+-----------+----------+ +-------+----------------+-----------+------------+------------+  ABI/TBI Today's ABI      Today's TBI Previous ABI Previous TBI  +-------+----------------+-----------+------------+------------+  Right   normal waveforms amp         1.03         amp           +-------+----------------+-----------+------------+------------+  Left    normal waveforms amp         1.02         amp           +-------+----------------+-----------+------------+------------+  Summary: Right: Normal waveforms. Left: Normal waveforms.  *See table(s) above for measurements and observations.  Electronically signed by Heath Lark on 01/30/2022 at 3:46:56 PM.    Final    - pertinent xrays, CT, MRI studies were reviewed and independently interpreted  Positive ROS: All other systems have been reviewed and were otherwise negative with the exception of  those mentioned in the HPI and as above.  Physical Exam: General: Alert, no acute distress Psychiatric: Patient is competent for consent with normal mood and affect Lymphatic: No axillary or cervical lymphadenopathy Cardiovascular: No pedal edema Respiratory: No cyanosis, no use of accessory musculature GI: No organomegaly, abdomen is soft and non-tender    Images:  @ENCIMAGES @  Labs:  Lab Results  Component Value Date   HGBA1C 5.8 (H) 11/13/2021   HGBA1C 6.4 (H) 12/18/2020   HGBA1C 5.9 (H) 09/09/2020   ESRSEDRATE 33 (H) 11/13/2020   CRP 3.3 (H) 12/03/2021   CRP 10.8 (H) 11/13/2020   REPTSTATUS PENDING 01/29/2022   GRAMSTAIN  01/02/2021    NO WBC SEEN FEW GRAM POSITIVE COCCI RARE GRAM NEGATIVE RODS Performed at [facility], [address] [place], [name]    CULT  01/29/2022    NO GROWTH 2 DAYS Performed at [facility], [address], [place], [name]    LABORGA ENTEROBACTER CLOACAE (A) 01/29/2022    Lab Results  Component Value Date   ALBUMIN 3.5 01/29/2022   ALBUMIN 3.1 (L) 12/04/2021   ALBUMIN 3.7 11/25/2021     CBC EXTENDED Latest Ref Rng & Units 02/01/2022 01/31/2022 01/30/2022  WBC 4.0 - 10.5 K/uL 12.4(H) 6.5 6.3  RBC 4.22 - 5.81 MIL/uL 3.77(L) 3.90(L) 4.04(L)  HGB 13.0 - 17.0 g/dL 12.4(L) 12.9(L) 13.3  HCT 39.0 - 52.0 % 36.8(L) 38.5(L) 40.8  PLT 150 - 400 K/uL 302 286 282  NEUTROABS 1.7 - 7.7 K/uL 10.9(H) 5.5 -  LYMPHSABS 0.7 - 4.0 K/uL 1.1 0.8 -    Neurologic: Patient does  not have protective sensation bilateral lower extremities.   MUSCULOSKELETAL:   Skin: Examination patient has brawny skin color changes dry cracked skin on the right lower extremity.  There is a venous insufficiency ulcer over the medial malleolus that is superficial does not extend down to bone or tendon.  Patient has palpable pulses.  Most recent ankle-brachial indices show triphasic flow with normal pressures.  The calf has pitting edema but  no tenderness to palpation.  Assessment: Assessment: Chronic venous insufficiency with venous stasis ulceration right lower extremity medial malleolus.  Plan: Order written for nursing to apply a [place]na compression wrap.  Patient may continue following up at the wound center.  Thank you for the consult and the opportunity to see Mr. [name] Copes, MD [facility] [phone] 10:06 AM

## 2022-02-02 ENCOUNTER — Inpatient Hospital Stay (HOSPITAL_COMMUNITY): Payer: Medicaid Other

## 2022-02-02 DIAGNOSIS — I160 Hypertensive urgency: Secondary | ICD-10-CM | POA: Diagnosis not present

## 2022-02-02 LAB — GLUCOSE, CAPILLARY
Glucose-Capillary: 139 mg/dL — ABNORMAL HIGH (ref 70–99)
Glucose-Capillary: 97 mg/dL (ref 70–99)
Glucose-Capillary: 122 mg/dL — ABNORMAL HIGH (ref 70–99)
Glucose-Capillary: 145 mg/dL — ABNORMAL HIGH (ref 70–99)

## 2022-02-02 LAB — COMPREHENSIVE METABOLIC PANEL
ALT: 19 U/L (ref 0–44)
AST: 14 U/L — ABNORMAL LOW (ref 15–41)
Albumin: 3 g/dL — ABNORMAL LOW (ref 3.5–5.0)
Alkaline Phosphatase: 70 U/L (ref 38–126)
Anion gap: 11 (ref 5–15)
BUN: 28 mg/dL — ABNORMAL HIGH (ref 6–20)
CO2: 25 mmol/L (ref 22–32)
Calcium: 8.5 mg/dL — ABNORMAL LOW (ref 8.9–10.3)
Chloride: 102 mmol/L (ref 98–111)
Creatinine, Ser: 1.51 mg/dL — ABNORMAL HIGH (ref 0.61–1.24)
GFR, Estimated: 55 mL/min — ABNORMAL LOW (ref >60)
Glucose, Bld: 110 mg/dL — ABNORMAL HIGH (ref 70–99)
Potassium: 4 mmol/L (ref 3.5–5.1)
Sodium: 138 mmol/L (ref 135–145)
Total Bilirubin: 0.3 mg/dL (ref 0.3–1.2)
Total Protein: 6.1 g/dL — ABNORMAL LOW (ref 6.5–8.1)

## 2022-02-02 LAB — CBC WITH DIFFERENTIAL/PLATELET
Abs Immature Granulocytes: 0.06 10*3/uL (ref 0.00–0.07)
Basophils Absolute: 0 10*3/uL (ref 0.0–0.1)
Basophils Relative: 0 %
Eosinophils Absolute: 0 10*3/uL (ref 0.0–0.5)
Eosinophils Relative: 0 %
HCT: 39.5 % (ref 39.0–52.0)
Hemoglobin: 12.9 g/dL — ABNORMAL LOW (ref 13.0–17.0)
Immature Granulocytes: 1 %
Lymphocytes Relative: 12 %
Lymphs Abs: 1.3 10*3/uL (ref 0.7–4.0)
MCH: 32.3 pg (ref 26.0–34.0)
MCHC: 32.7 g/dL (ref 30.0–36.0)
MCV: 99 fL (ref 80.0–100.0)
Monocytes Absolute: 0.5 10*3/uL (ref 0.1–1.0)
Monocytes Relative: 4 %
Neutro Abs: 9.5 10*3/uL — ABNORMAL HIGH (ref 1.7–7.7)
Neutrophils Relative %: 83 %
Platelets: 339 10*3/uL (ref 150–400)
RBC: 3.99 MIL/uL — ABNORMAL LOW (ref 4.22–5.81)
RDW: 15.5 % (ref 11.5–15.5)
WBC: 11.4 10*3/uL — ABNORMAL HIGH (ref 4.0–10.5)
nRBC: 0 % (ref 0.0–0.2)

## 2022-02-02 LAB — MAGNESIUM: Magnesium: 1.9 mg/dL (ref 1.7–2.4)

## 2022-02-02 LAB — BRAIN NATRIURETIC PEPTIDE: B Natriuretic Peptide: 561.2 pg/mL — ABNORMAL HIGH (ref 0.0–100.0)

## 2022-02-02 IMAGING — DX DG CHEST 1V PORT
1 series · 1 of 1 positions shown · non-contrast
Comparison: Portable chest [DATE] and earlier.

CLINICAL DATA: 55-year-old male with shortness of breath.
Combative.

EXAM:
PORTABLE CHEST 1 VIEW

[chest]
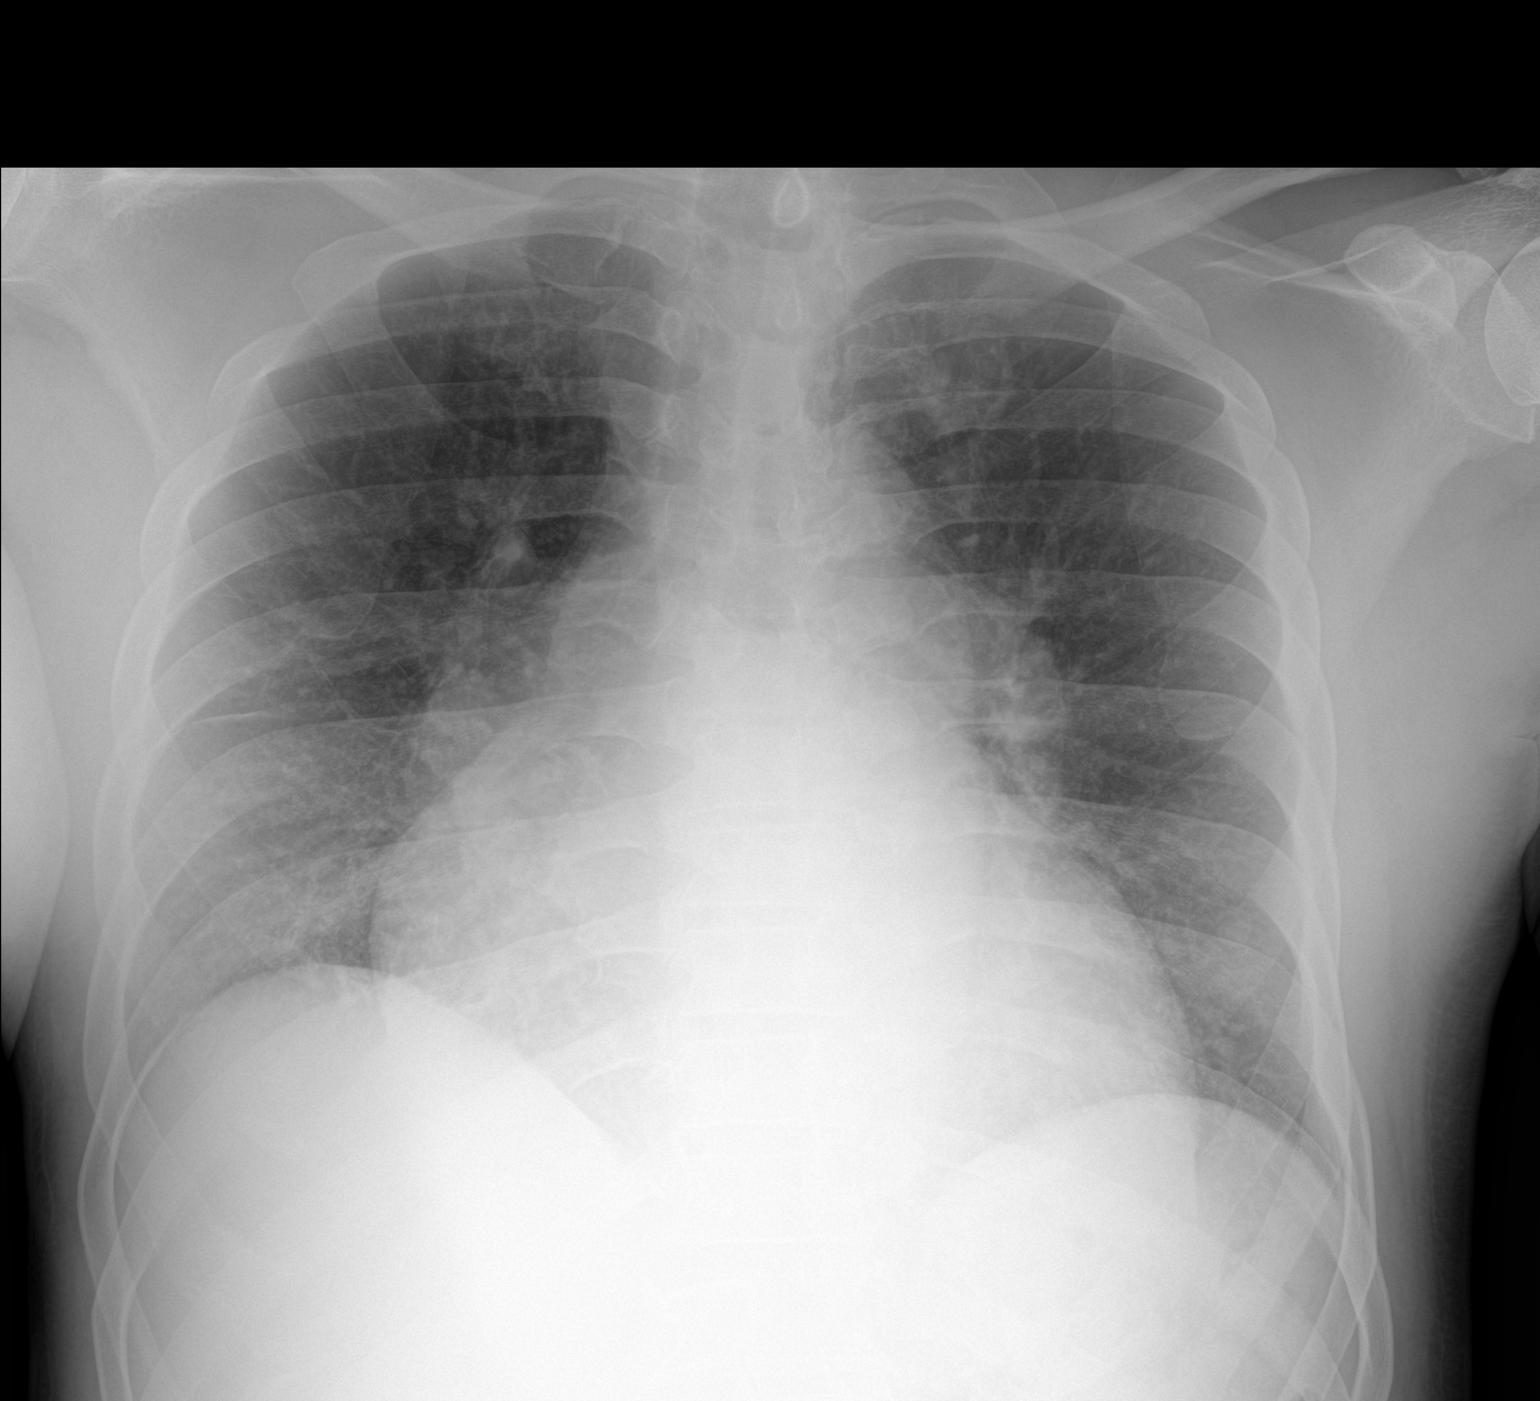

[1 of 1 positions shown; findings below may reference images not displayed]

FINDINGS: Portable AP semi upright view at [MT] hours. Lower lung volumes.
Stable cardiomegaly and mediastinal contours. Continued patchy
asymmetric perhaps interstitial opacity in the right lung. Trace
pleural fluid in the right minor fissure. Increased background
vascular congestion versus crowding of lung markings. No
pneumothorax, consolidation, or layering pleural fluid. Visualized
tracheal air column is within normal limits. No acute osseous
abnormality identified. Paucity of bowel gas in the upper abdomen.
IMPRESSION: Cardiomegaly with some vascular congestion and continued indistinct
asymmetric right lung opacity with trace fluid in the minor fissure.
Favor asymmetric pulmonary edema over viral/atypical respiratory
infection.

## 2022-02-02 MED ORDER — CLONIDINE HCL 0.1 MG PO TABS
0.1000 mg | ORAL_TABLET | Freq: Three times a day (TID) | ORAL | Status: DC | PRN
Start: 2022-02-02 — End: 2022-02-04

## 2022-02-02 MED ORDER — LACTATED RINGERS IV SOLN: INTRAVENOUS | Status: AC

## 2022-02-02 MED ORDER — DIPHENHYDRAMINE-ZINC ACETATE 2-0.1 % EX CREA
TOPICAL_CREAM | Freq: Three times a day (TID) | CUTANEOUS | Status: DC | PRN
  Filled 2022-02-02 (×2): qty 28

## 2022-02-02 MED ORDER — OXYCODONE HCL 5 MG PO TABS
5.0000 mg | ORAL_TABLET | Freq: Two times a day (BID) | ORAL | Status: DC | PRN
Start: 2022-02-02 — End: 2022-02-05
  Administered 2022-02-02 – 2022-02-03 (×2): 5 mg via ORAL
  Filled 2022-02-02 (×3): qty 1

## 2022-02-02 MED ORDER — CLONIDINE HCL 0.1 MG PO TABS: 0.1000 mg | ORAL_TABLET | Freq: Two times a day (BID) | ORAL | Status: DC

## 2022-02-02 MED ORDER — LACTATED RINGERS IV SOLN
INTRAVENOUS | Status: DC
Start: 2022-02-02 — End: 2022-02-02

## 2022-02-02 MED ORDER — LORAZEPAM 2 MG/ML IJ SOLN
1.0000 mg | Freq: Once | INTRAMUSCULAR | Status: AC
  Administered 2022-02-02: 1 mg via INTRAVENOUS
  Filled 2022-02-02: qty 1

## 2022-02-02 NOTE — Progress Notes (Addendum)
Complained of pain 10/10, aching on right foot. Refused tramadol initially. Paged the T. Opyd, MD. Patient is verbally abusive. Yelling at the staff. Refused vital signs monitoring. Refused due medication. Refused thorough assessment and wound care. Will follow up.  ?

## 2022-02-02 NOTE — Progress Notes (Signed)
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  20.1 LV IVS:        1.20 cm      LV e' lateral:   4.53 cm/s LVOT diam:     2.10 cm      LV E/e' lateral: 23.8 LV SV:         35 LV SV Index:   17 LVOT Area:     3.46 cm  LV Volumes (MOD) LV vol d, MOD A4C: 144.0 ml LV vol s, MOD A4C: 99.1 ml LV SV MOD A4C:     144.0 ml RIGHT VENTRICLE RV Basal diam:  5.20 cm RV Mid diam:    2.90 cm RV S prime:     9.07 cm/s TAPSE (M-mode): 1.5 cm LEFT ATRIUM           Index        RIGHT ATRIUM           Index LA diam:      5.10 cm 2.48 cm/m   RA Area:     25.10 cm LA Vol (A4C): 83.0 ml 40.31 ml/m  RA Volume:   87.30 ml  42.40 ml/m  AORTIC VALVE                    PULMONIC VALVE AV Area (Vmax):    2.43 cm     PV Vmax:          0.73 m/s AV Area (Vmean):   2.12 cm     PV Vmean:         49.000 cm/s AV Area (VTI):     2.17 cm     PV VTI:           0.111 m AV Vmax:           95.90 cm/s   PV Peak grad:     2.1 mmHg AV Vmean:          67.000 cm/s  PV Mean grad:     1.0 mmHg AV VTI:            0.159 m      PR End Diast Vel:  13.25 msec AV Peak Grad:      3.7 mmHg AV Mean Grad:      2.0 mmHg LVOT Vmax:         67.20 cm/s LVOT Vmean:        41.100 cm/s LVOT VTI:          0.100 m LVOT/AV VTI ratio: 0.63  AORTA Ao Root diam: 2.90 cm Ao Asc diam:  3.20 cm MITRAL VALVE                TRICUSPID VALVE MV Area (PHT): 6.43 cm     TR Peak grad:   39.7 mmHg MV Decel Time: 118 msec     TR Vmax:        315.00 cm/s MV E velocity: 108.00 cm/s MV A velocity: 39.80 cm/s   SHUNTS MV E/A ratio:  2.71  Systemic VTI:  0.10 m                             Systemic Diam: 2.10 cm Lyman Bishop MD Electronically signed by Lyman Bishop MD Signature Date/Time: 01/30/2022/11:15:44 AM    Final

## 2022-02-02 NOTE — Plan of Care (Signed)
?  Problem: Education: ?Goal: Knowledge of General Education information will improve ?Description: Including pain rating scale, medication(s)/side effects and non-pharmacologic comfort measures ?Outcome: Progressing ?  ?Problem: Clinical Measurements: ?Goal: Ability to maintain clinical measurements within normal limits will improve ?Outcome: Progressing ?  ?Problem: Clinical Measurements: ?Goal: Respiratory complications will improve ?Outcome: Progressing ?  ?Problem: Clinical Measurements: ?Goal: Cardiovascular complication will be avoided ?Outcome: Progressing ?  ?Problem: Coping: ?Goal: Level of anxiety will decrease ?Outcome: Progressing ?  ?Problem: Pain Managment: ?Goal: General experience of comfort will improve ?Outcome: Progressing ?  ?Problem: Safety: ?Goal: Ability to remain free from injury will improve ?Outcome: Progressing ?  ?Problem: Skin Integrity: ?Goal: Risk for impaired skin integrity will decrease ?Outcome: Progressing ?  ?Problem: Cardiac: ?Goal: Ability to achieve and maintain adequate cardiopulmonary perfusion will improve ?Outcome: Progressing ?  ?

## 2022-02-02 NOTE — TOC Progression Note (Signed)
Transition of Care (TOC) - Initial/Assessment Note  ? ? ?Patient Details  ?Name: [name] ?MRN: [id number] ?Date of Birth: 1966/04/26 ? ?Transition of Care (TOC) CM/SW Contact:    ?Paulene Floor Sharonica Kraszewski, LCSWA ?Phone Number: ?02/02/2022, 11:21 AM ? ?Clinical Narrative:                 ?CSW met with the patient at bedside as the patient has not received any bed offers.  CSW inquired about the patient's willingness to faxed to facilities in [place].  The patient reported that he would be willing to go to a facility in [place], but [place] is "too far".  CSW faxed the referral to [place] facilities.     ? ?Expected Discharge Plan: [place] ?Barriers to Discharge: Continued Medical Work up, Ship broker, Active Substance Use - Placement ? ? ?Patient Goals and CMS Choice ?Patient states their goals for this hospitalization and ongoing recovery are:: Get stronger ?CMS Medicare.gov Compare Post Acute Care list provided to:: Patient ?Choice offered to / list presented to : Patient ? ?Expected Discharge Plan and Services ?Expected Discharge Plan: [place] ?In-house Referral: Clinical Social Work ?  ?  ?Living arrangements for the past 2 months: Homeless ?                ?  ?  ?  ?  ?  ?  ?  ?  ?  ?  ? ?Prior Living Arrangements/Services ?Living arrangements for the past 2 months: Homeless ?Lives with:: Self ?Patient language and need for interpreter reviewed:: Yes ?Do you feel safe going back to the place where you live?: Yes      ?Need for Family Participation in Patient Care: No (Comment) ?Care giver support system in place?: No (comment) ?  ?Criminal Activity/Legal Involvement Pertinent to Current Situation/Hospitalization: No - Comment as needed ? ?Activities of Daily Living ?  ?  ? ?Permission Sought/Granted ?Permission sought to share information with : Case Manager, Customer service manager, Other (comment) (SW Philipp Deputy) ?  ? Share Information with NAME: Philipp Deputy ? Permission granted to share info w AGENCY: SNFs ? Permission granted to share info w Relationship: Social Worker-SWELL program ? Permission granted to share info w Contact Information: [phone] ? ?Emotional Assessment ?Appearance:: Appears older than stated age ?Attitude/Demeanor/Rapport: Engaged ?Affect (typically observed): Calm ?Orientation: : Oriented to Self, Oriented to Place, Oriented to  Time, Oriented to Situation ?Alcohol / Substance Use: Illicit Drugs ?Psych Involvement: Yes (comment) ? ?Admission diagnosis:  Cocaine abuse ([place]) [F14.10] ?Weakness [R53.1] ?Homeless [Z59.00] ?Dysarthria [R47.1] ?Hypertensive urgency [I16.0] ?Nonhealing nonsurgical wound limited to breakdown of skin [T14.8XXA] ?Hypertension, unspecified type [I10] ?Patient Active Problem List  ? Diagnosis Date Noted  ? Venous stasis ulcer of right ankle limited to breakdown of skin with varicose veins (HCC)   ? Hypertensive urgency 01/29/2022  ? Prolonged QT interval 01/29/2022  ? Alcohol abuse 01/29/2022  ? chronic ulcers 01/29/2022  ? Wound infection 12/04/2021  ? Open wounds involving multiple regions of lower extremity 12/03/2021  ? Mixed diabetic hyperlipidemia associated with type 2 diabetes mellitus ([place]) 12/03/2021  ? Cellulitis of right lower extremity 11/13/2021  ? Type 2 diabetes mellitus with diabetic polyneuropathy, without long-term current use of insulin ([place]) 11/13/2021  ? Antisocial personality disorder ([place]) 11/04/2021  ? Malingering 11/04/2021  ? Foot pain 04/23/2021  ? Chronic combined systolic and diastolic congestive heart failure ([place]) 03/02/2021  ? History of stroke 03/02/2021  ? Substance  use disorder 03/02/2021  ? Skin ulcer of right great toe with necrosis of bone ([place])   ? Encounter for assessment of healthcare decision-making capacity   ? Pain, dental   ? Dystonia   ? Dysarthria   ? Shock liver 01/04/2021  ? Acute metabolic encephalopathy 75/88/3254  ? Coronary artery disease involving native  coronary artery of native heart without angina pectoris 01/04/2021  ? Bipolar I disorder ([place]) 01/04/2021  ? Cocaine abuse ([place]) 01/04/2021  ? Homelessness 01/04/2021  ? HLD (hyperlipidemia) 01/04/2021  ? Osteomyelitis of great toe ([place])   ? Ulcer of great toe, left, with necrosis of bone ([place])   ? Ulcer of great toe, right, with necrosis of muscle ([place])   ? Acute osteomyelitis of toe, left ([place])   ? Acute on chronic combined systolic and diastolic CHF (congestive heart failure) ([place]) 12/20/2020  ? Respiratory failure ([place])   ? Ischemic stroke ([place]) 12/17/2020  ? 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 Diabetic foot ulcer ([place]) 12/08/2020  ? CHF (congestive heart failure) ([place]) 11/14/2020  ? CHF exacerbation ([place]) 11/13/2020  ? Hypokalemia   ? Hyperlipidemia   ? Acute respiratory failure with hypoxia ([place]) 10/26/2020  ? COPD with acute exacerbation ([place]) 10/14/2020  ? Acute on chronic combined systolic and diastolic CHF (congestive heart failure) ([place]) 10/02/2020  ? Near syncope 07/21/2020  ? Schizoaffective disorder ([place]) 06/24/2019  ? SOB (shortness of breath)   ? QT prolongation 05/25/2019  ? Cocaine abuse ([place])   ? Elevated troponin   ? Homeless   ? Suicidal ideation   ? Bilateral foot pain 04/05/2019  ? Housing situation unstable 03/28/2019  ? Bilateral lower extremity edema   ? Asthma 12/26/2016  ? Tobacco use disorder 12/26/2016  ? Schizophrenia ([place]) 12/26/2016  ? Cocaine abuse with cocaine-induced mood disorder ([place]) 09/06/2016  ? Alcohol use disorder, mild, abuse 07/31/2016  ? Substance induced mood disorder ([place]) 07/30/2016  ? Schizoaffective disorder, bipolar type ([place]) 05/31/2016  ? Cocaine use disorder, severe, dependence ([place]) 05/31/2016  ? ?PCP:  Pcp, No ?Pharmacy:   ?[facility] [address] ?[address] ?[place] [address] ?Phone: [phone] Fax: [phone] ? ?[facility] ?[address] ?[place] [address] ?Phone:  [phone] Fax: [phone] ? ? ? ? ?Social Determinants of Health (SDOH) Interventions ?  ? ?Readmission Risk Interventions ?Readmission Risk Prevention Plan 12/05/2021 03/05/2021 01/07/2021  ?Transportation Screening Complete Complete Complete  ?PCP or Specialist Appt within 3-5 Days - - Complete  ?[place] or Home Care Consult - - Complete  ?Social Work Consult for [place] Planning/Counseling - - Complete  ?Palliative Care Screening - - Complete  ?Medication Review Press photographer) Not Complete Complete Complete  ?PCP or Specialist appointment within 3-5 days of discharge Not Complete Complete -  ?[place] or [place] Not Complete Complete -  ?SW Recovery Care/Counseling Consult Not Complete Complete -  ?Palliative Care Screening Not Applicable Not Applicable -  ?Skilled Nursing Facility Not Applicable Complete -  ?Some recent data might be hidden  ? ? ? ?

## 2022-02-02 NOTE — Progress Notes (Addendum)
Patient refused bladder scan and IVF. He asked the RN to come back after breakfast. Will hand it over to the next shift. Education given. ?

## 2022-02-03 ENCOUNTER — Inpatient Hospital Stay (HOSPITAL_COMMUNITY): Payer: Medicaid Other

## 2022-02-03 DIAGNOSIS — I160 Hypertensive urgency: Secondary | ICD-10-CM | POA: Diagnosis not present

## 2022-02-03 LAB — CBC WITH DIFFERENTIAL/PLATELET
Abs Immature Granulocytes: 0.03 10*3/uL (ref 0.00–0.07)
Basophils Absolute: 0 10*3/uL (ref 0.0–0.1)
Basophils Relative: 1 %
Eosinophils Absolute: 0.1 10*3/uL (ref 0.0–0.5)
Eosinophils Relative: 1 %
HCT: 41.7 % (ref 39.0–52.0)
Hemoglobin: 14 g/dL (ref 13.0–17.0)
Immature Granulocytes: 0 %
Lymphocytes Relative: 27 %
Lymphs Abs: 2.4 10*3/uL (ref 0.7–4.0)
MCH: 33.1 pg (ref 26.0–34.0)
MCHC: 33.6 g/dL (ref 30.0–36.0)
MCV: 98.6 fL (ref 80.0–100.0)
Monocytes Absolute: 0.9 10*3/uL (ref 0.1–1.0)
Monocytes Relative: 11 %
Neutro Abs: 5.3 10*3/uL (ref 1.7–7.7)
Neutrophils Relative %: 60 %
Platelets: 291 10*3/uL (ref 150–400)
RBC: 4.23 MIL/uL (ref 4.22–5.81)
RDW: 15.1 % (ref 11.5–15.5)
WBC: 8.7 10*3/uL (ref 4.0–10.5)
nRBC: 0 % (ref 0.0–0.2)

## 2022-02-03 LAB — CULTURE, BLOOD (ROUTINE X 2)
Culture: NO GROWTH
Culture: NO GROWTH
Special Requests: ADEQUATE
Special Requests: ADEQUATE

## 2022-02-03 LAB — COMPREHENSIVE METABOLIC PANEL
ALT: 16 U/L (ref 0–44)
AST: 15 U/L (ref 15–41)
Albumin: 2.9 g/dL — ABNORMAL LOW (ref 3.5–5.0)
Alkaline Phosphatase: 61 U/L (ref 38–126)
Anion gap: 9 (ref 5–15)
BUN: 25 mg/dL — ABNORMAL HIGH (ref 6–20)
CO2: 25 mmol/L (ref 22–32)
Calcium: 8.2 mg/dL — ABNORMAL LOW (ref 8.9–10.3)
Chloride: 104 mmol/L (ref 98–111)
Creatinine, Ser: 1.33 mg/dL — ABNORMAL HIGH (ref 0.61–1.24)
GFR, Estimated: >60 mL/min (ref >60)
Glucose, Bld: 113 mg/dL — ABNORMAL HIGH (ref 70–99)
Potassium: 3.8 mmol/L (ref 3.5–5.1)
Sodium: 138 mmol/L (ref 135–145)
Total Bilirubin: 0.7 mg/dL (ref 0.3–1.2)
Total Protein: 5.8 g/dL — ABNORMAL LOW (ref 6.5–8.1)

## 2022-02-03 LAB — GLUCOSE, CAPILLARY
Glucose-Capillary: 151 mg/dL — ABNORMAL HIGH (ref 70–99)
Glucose-Capillary: 97 mg/dL (ref 70–99)
Glucose-Capillary: 109 mg/dL — ABNORMAL HIGH (ref 70–99)
Glucose-Capillary: 144 mg/dL — ABNORMAL HIGH (ref 70–99)

## 2022-02-03 LAB — MAGNESIUM: Magnesium: 2.1 mg/dL (ref 1.7–2.4)

## 2022-02-03 LAB — BRAIN NATRIURETIC PEPTIDE: B Natriuretic Peptide: 776.3 pg/mL — ABNORMAL HIGH (ref 0.0–100.0)

## 2022-02-03 IMAGING — CT CT ANGIO HEAD-NECK (W OR W/O PERF)
1 of 11 series · 5 of 33 positions shown · IV contrast (OMNI 350)
Comparison: MRI FROM [DATE].

CLINICAL DATA: Initial evaluation for acute headache.

EXAM:
CT ANGIOGRAPHY HEAD AND NECK
CT VENOGRAM HEAD
TECHNIQUE: Multidetector CT imaging of the head and neck was performed using
the standard protocol during bolus administration of intravenous
contrast. Multiplanar CT image reconstructions and MIPs were
obtained to evaluate the vascular anatomy. Carotid stenosis
measurements (when applicable) are obtained utilizing NASCET
criteria, using the distal internal carotid diameter as the
denominator.

[Series 11: cta neck axial · axial · 0.39mm/px · z∈[+100,+335]mm · 5 of 353 slices shown]
[im 59/353  soft-tissue]
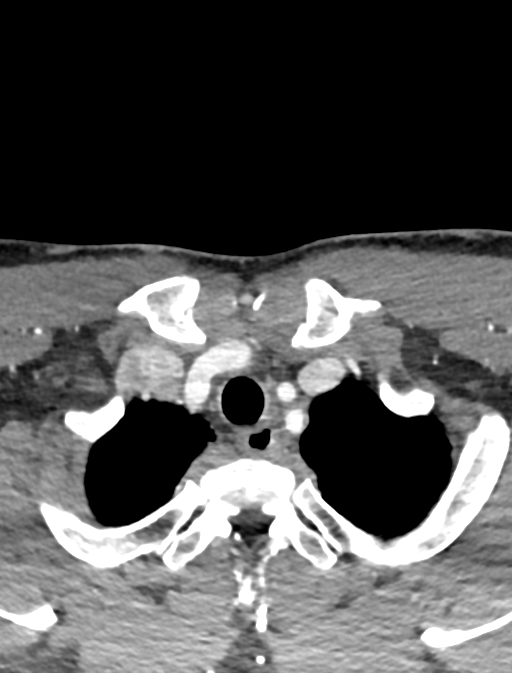
[im 118/353  bone]
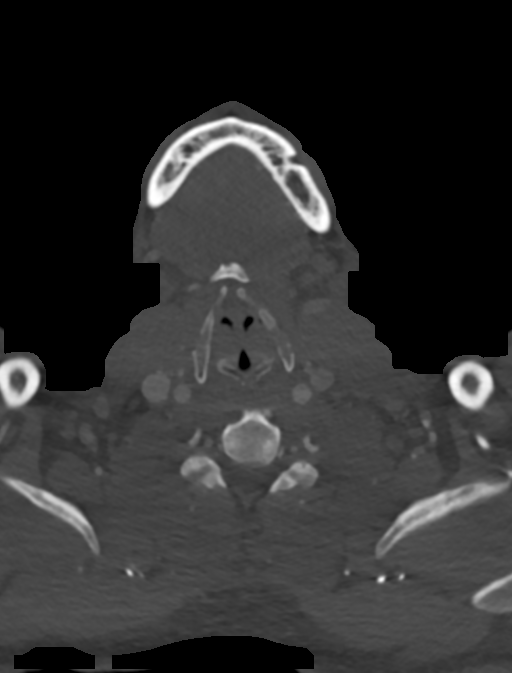
[im 177/353  soft-tissue]
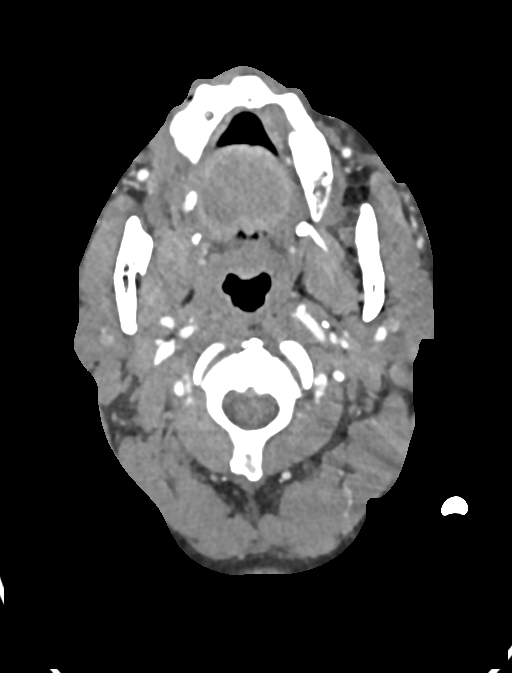
[im 235/353  bone]
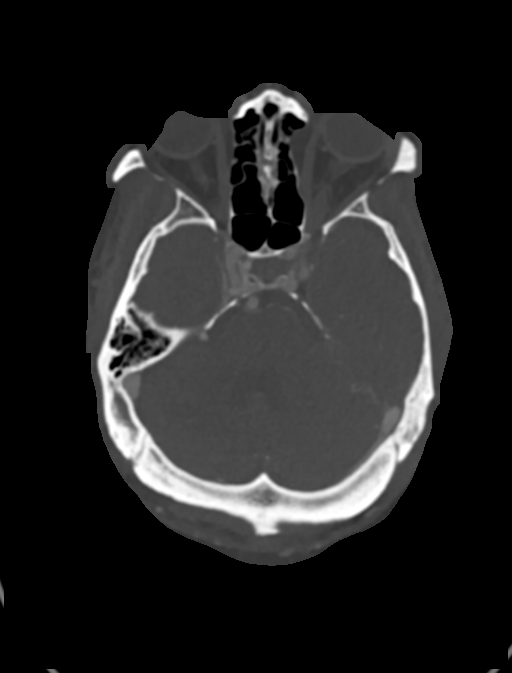
[im 294/353  soft-tissue]
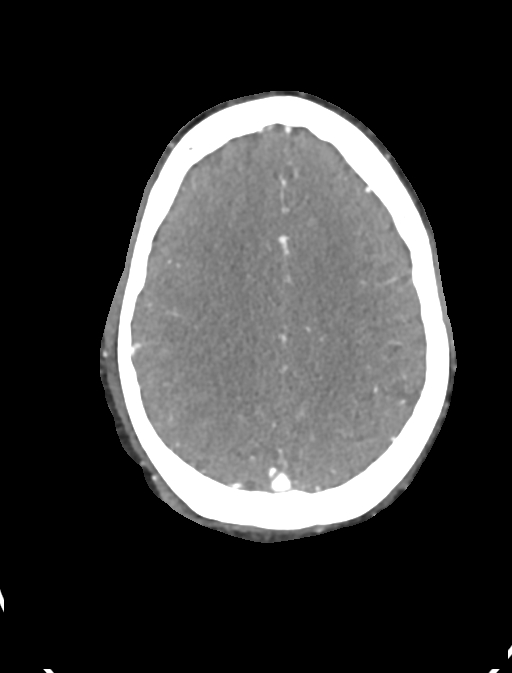

[5 of 33 positions shown; findings below may reference images not displayed]

RADIATION DOSE REDUCTION: This exam was performed according to the
departmental dose-optimization program which includes automated
exposure control, adjustment of the mA and/or kV according to
patient size and/or use of iterative reconstruction technique.

CONTRAST:  75mL OMNIPAQUE IOHEXOL 350 MG/ML SOLN
FINDINGS: CT HEAD FINDINGS

Brain: Cerebral volume within normal limits. Moderate chronic
microvascular ischemic disease with multiple remote cerebellar
infarcts again noted, stable. No acute intracranial hemorrhage. No
acute large vessel territory infarct. No mass lesion or midline
shift. No hydrocephalus or extra-axial fluid collection.

Vascular: No hyperdense vessel. Scattered vascular calcifications
noted within the carotid siphons.

Skull: Scalp soft tissues and calvarium within normal limits.

Sinuses: Clear.

Orbits: Chronic posttraumatic defect noted at the left lamina
papyracea. Otherwise unremarkable.

Review of the MIP images confirms the above findings

CTA NECK FINDINGS

Aortic arch: Visualized aortic arch normal in caliber with normal
branch pattern. Mild atheromatous change about the arch and origin
of the great vessels without significant stenosis.

Right carotid system: Right CCA patent from its origin to the
bifurcation without stenosis. Bulky calcified plaque about the right
carotid bulb with associated severe short-segment stenosis of up to
80% by NASCET criteria. Right ICA patent distally without stenosis
or dissection.

Left carotid system: Left CCA patent from its origin to the
bifurcation without stenosis. Eccentric calcified plaque about the
left carotid bulb/proximal left ICA without hemodynamically
significant stenosis. Left ICA patent distally without stenosis or
dissection.

Vertebral arteries: Both vertebral arteries arise from the
subclavian arteries. No significant proximal subclavian artery
stenosis. Severe ostial stenosis noted at the origin of the left
vertebral artery (series 11, image 279). Vertebral arteries
otherwise patent without stenosis or dissection.

Skeleton: No discrete or worrisome osseous lesions. Poor dentition
noted.

Other neck: No other acute soft tissue abnormality within the neck.

Upper chest: Emphysematous changes noted within the visualized
lungs. Visualized upper chest demonstrates no other acute finding.

Review of the MIP images confirms the above findings

CTA HEAD FINDINGS

Anterior circulation: Petrous segments patent bilaterally. Mild
atheromatous change seen throughout the carotid siphons without
hemodynamically significant stenosis. A1 segments patent
bilaterally. Right A1 hypoplastic. Normal anterior communicating
artery complex. Anterior cerebral arteries patent to their distal
aspects without stenosis. No M1 stenosis or occlusion. Normal MCA
bifurcations. Distal MCA branches perfused and symmetric.

Posterior circulation: Both V4 segments patent without stenosis.
Right vertebral artery dominant. Both PICA patent at their origins.
Basilar patent to its distal aspect without stenosis. Superior
cerebellar arteries patent bilaterally. Both PCAs primarily supplied
via the basilar. Mild atheromatous irregularity within the left
greater than right PCAs without hemodynamically significant
stenosis.

Venous sinuses: Normal enhancement seen throughout the superior
sagittal sinus to the torcula. Transverse and sigmoid sinuses are
patent as are the visualized internal jugular veins. Straight sinus,
vein of JIM, internal cerebral veins, and basal veins of JIM
appear patent. No appreciable abnormality about the cavernous sinus.
No dural venous sinus thrombosis.

Anatomic variants: As above. No aneurysm or other vascular
malformation.

Review of the MIP images confirms the above findings
IMPRESSION: CT HEAD:

1. No acute intracranial abnormality.
2. Moderate chronic microvascular ischemic disease with multiple
remote cerebellar infarcts, stable.

CTA HEAD AND NECK:

1. Negative CTA for large vessel occlusion. No intracranial
aneurysm.
2. Bulky calcified plaque about the right carotid bulb with
associated severe short-segment stenosis of up to 80% by NASCET
criteria.
3. Severe ostial stenosis at the origin of the left vertebral
artery. Otherwise wide patency of the vertebrobasilar system.
4. Mild atheromatous change elsewhere about the major arterial
vasculature of the head and neck as above. No other proximal
high-grade or correctable stenosis.
5. Emphysema ([XP]-[XP]).

CT VENOGRAM:

Negative CT venogram.  No evidence for dural sinus thrombosis.

## 2022-02-03 IMAGING — CT CT VENOGRAM HEAD
1 series · 12 of 14 positions shown · IV contrast (OMNI 350)
Comparison: MRI FROM [DATE].

CLINICAL DATA: Initial evaluation for acute headache.

EXAM:
CT ANGIOGRAPHY HEAD AND NECK
CT VENOGRAM HEAD
TECHNIQUE: Multidetector CT imaging of the head and neck was performed using
the standard protocol during bolus administration of intravenous
contrast. Multiplanar CT image reconstructions and MIPs were
obtained to evaluate the vascular anatomy. Carotid stenosis
measurements (when applicable) are obtained utilizing NASCET
criteria, using the distal internal carotid diameter as the
denominator.

[Series 3: venogram · axial · 0.47mm/px · z∈[+260,+405]mm · 12 of 35 slices shown]
[im 3/35  soft-tissue]
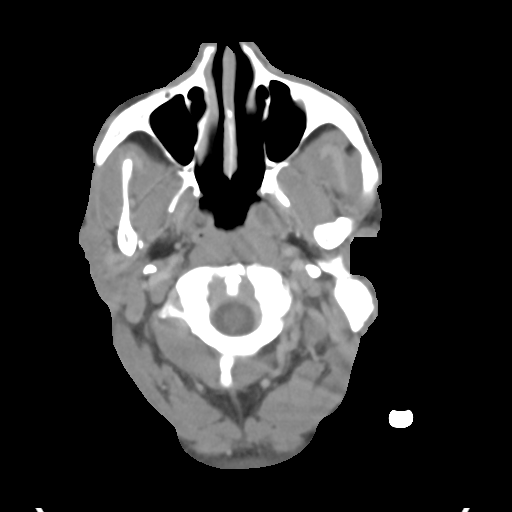
[im 6/35  bone]
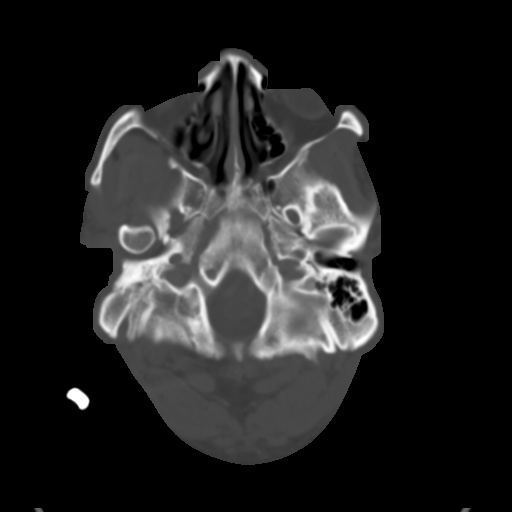
[im 8/35  soft-tissue]
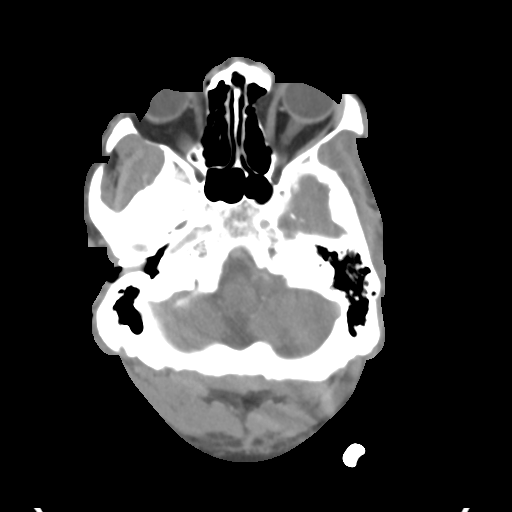
[im 11/35  bone]
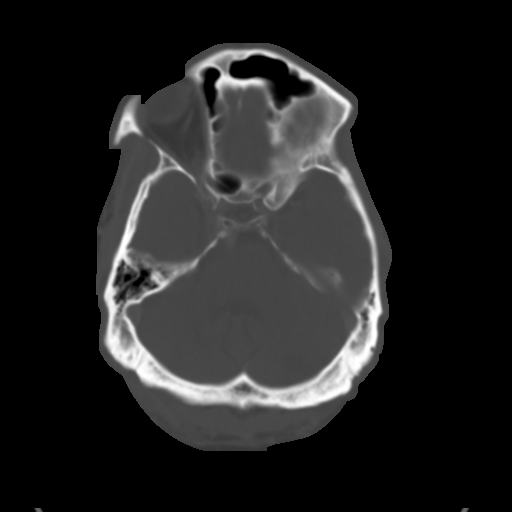
[im 14/35  soft-tissue]
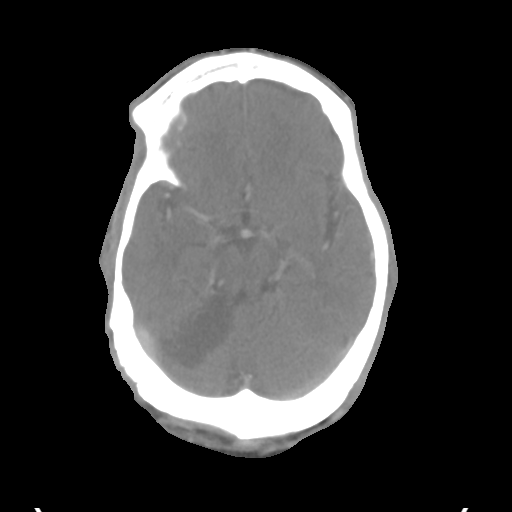
[im 16/35  bone]
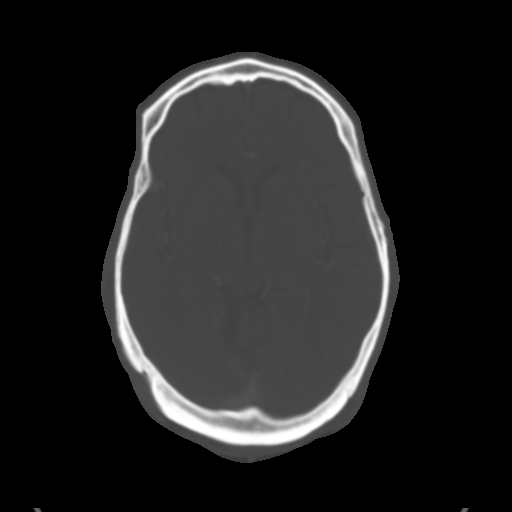
[im 19/35  soft-tissue]
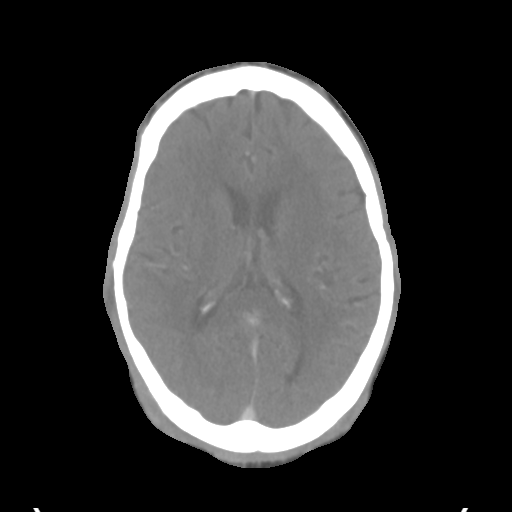
[im 21/35  bone]
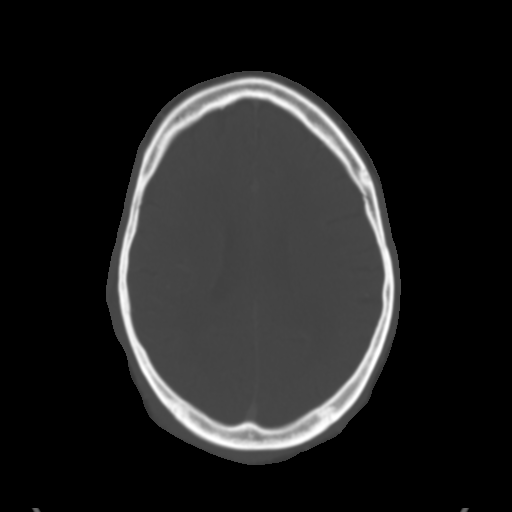
[im 24/35  soft-tissue]
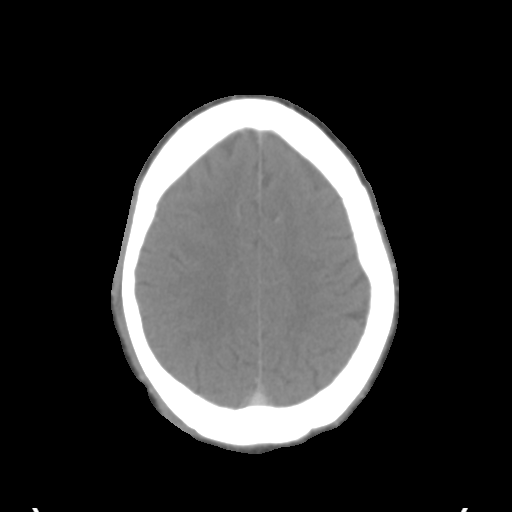
[im 27/35  bone]
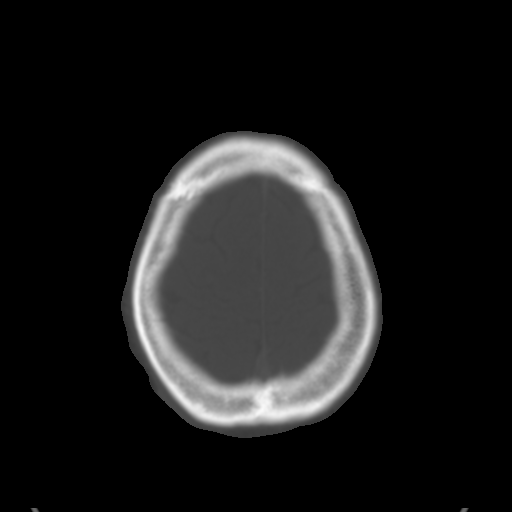
[im 29/35  soft-tissue]
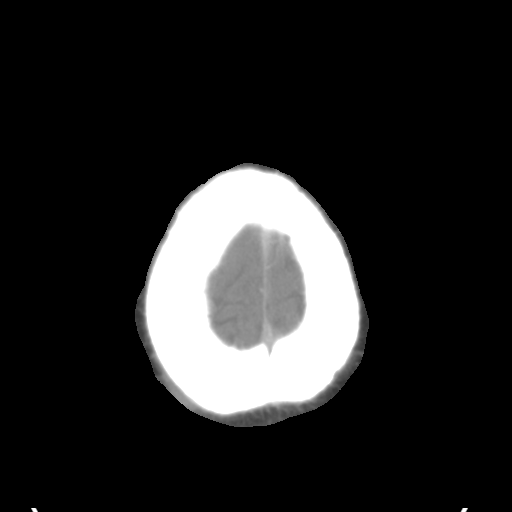
[im 32/35  bone]
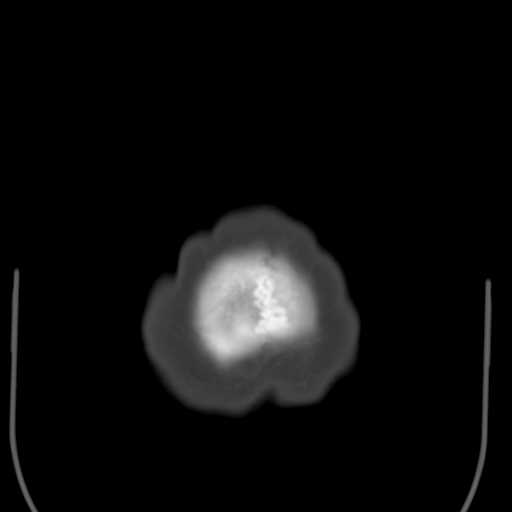

[12 of 14 positions shown; findings below may reference images not displayed]

RADIATION DOSE REDUCTION: This exam was performed according to the
departmental dose-optimization program which includes automated
exposure control, adjustment of the mA and/or kV according to
patient size and/or use of iterative reconstruction technique.

CONTRAST:  75mL OMNIPAQUE IOHEXOL 350 MG/ML SOLN
FINDINGS: CT HEAD FINDINGS

Brain: Cerebral volume within normal limits. Moderate chronic
microvascular ischemic disease with multiple remote cerebellar
infarcts again noted, stable. No acute intracranial hemorrhage. No
acute large vessel territory infarct. No mass lesion or midline
shift. No hydrocephalus or extra-axial fluid collection.

Vascular: No hyperdense vessel. Scattered vascular calcifications
noted within the carotid siphons.

Skull: Scalp soft tissues and calvarium within normal limits.

Sinuses: Clear.

Orbits: Chronic posttraumatic defect noted at the left lamina
papyracea. Otherwise unremarkable.

Review of the MIP images confirms the above findings

CTA NECK FINDINGS

Aortic arch: Visualized aortic arch normal in caliber with normal
branch pattern. Mild atheromatous change about the arch and origin
of the great vessels without significant stenosis.

Right carotid system: Right CCA patent from its origin to the
bifurcation without stenosis. Bulky calcified plaque about the right
carotid bulb with associated severe short-segment stenosis of up to
80% by NASCET criteria. Right ICA patent distally without stenosis
or dissection.

Left carotid system: Left CCA patent from its origin to the
bifurcation without stenosis. Eccentric calcified plaque about the
left carotid bulb/proximal left ICA without hemodynamically
significant stenosis. Left ICA patent distally without stenosis or
dissection.

Vertebral arteries: Both vertebral arteries arise from the
subclavian arteries. No significant proximal subclavian artery
stenosis. Severe ostial stenosis noted at the origin of the left
vertebral artery (series 11, image 279). Vertebral arteries
otherwise patent without stenosis or dissection.

Skeleton: No discrete or worrisome osseous lesions. Poor dentition
noted.

Other neck: No other acute soft tissue abnormality within the neck.

Upper chest: Emphysematous changes noted within the visualized
lungs. Visualized upper chest demonstrates no other acute finding.

Review of the MIP images confirms the above findings

CTA HEAD FINDINGS

Anterior circulation: Petrous segments patent bilaterally. Mild
atheromatous change seen throughout the carotid siphons without
hemodynamically significant stenosis. A1 segments patent
bilaterally. Right A1 hypoplastic. Normal anterior communicating
artery complex. Anterior cerebral arteries patent to their distal
aspects without stenosis. No M1 stenosis or occlusion. Normal MCA
bifurcations. Distal MCA branches perfused and symmetric.

Posterior circulation: Both V4 segments patent without stenosis.
Right vertebral artery dominant. Both PICA patent at their origins.
Basilar patent to its distal aspect without stenosis. Superior
cerebellar arteries patent bilaterally. Both PCAs primarily supplied
via the basilar. Mild atheromatous irregularity within the left
greater than right PCAs without hemodynamically significant
stenosis.

Venous sinuses: Normal enhancement seen throughout the superior
sagittal sinus to the torcula. Transverse and sigmoid sinuses are
patent as are the visualized internal jugular veins. Straight sinus,
vein of JIM, internal cerebral veins, and basal veins of JIM
appear patent. No appreciable abnormality about the cavernous sinus.
No dural venous sinus thrombosis.

Anatomic variants: As above. No aneurysm or other vascular
malformation.

Review of the MIP images confirms the above findings
IMPRESSION: CT HEAD:

1. No acute intracranial abnormality.
2. Moderate chronic microvascular ischemic disease with multiple
remote cerebellar infarcts, stable.

CTA HEAD AND NECK:

1. Negative CTA for large vessel occlusion. No intracranial
aneurysm.
2. Bulky calcified plaque about the right carotid bulb with
associated severe short-segment stenosis of up to 80% by NASCET
criteria.
3. Severe ostial stenosis at the origin of the left vertebral
artery. Otherwise wide patency of the vertebrobasilar system.
4. Mild atheromatous change elsewhere about the major arterial
vasculature of the head and neck as above. No other proximal
high-grade or correctable stenosis.
5. Emphysema ([XP]-[XP]).

CT VENOGRAM:

Negative CT venogram.  No evidence for dural sinus thrombosis.

## 2022-02-03 MED ORDER — IOHEXOL 350 MG/ML SOLN
75.0000 mL | Freq: Once | INTRAVENOUS | Status: AC | PRN
  Administered 2022-02-03: 75 mL via INTRAVENOUS

## 2022-02-03 MED ORDER — AMLODIPINE BESYLATE 5 MG PO TABS
5.0000 mg | ORAL_TABLET | Freq: Every day | ORAL | Status: DC
Start: 2022-02-03 — End: 2022-02-05
  Administered 2022-02-03 – 2022-02-04 (×2): 5 mg via ORAL
  Filled 2022-02-03 (×3): qty 1

## 2022-02-03 MED ORDER — DIPHENHYDRAMINE HCL 50 MG/ML IJ SOLN
25.0000 mg | Freq: Once | INTRAMUSCULAR | Status: AC
  Administered 2022-02-03: 25 mg via INTRAVENOUS
  Filled 2022-02-03: qty 1

## 2022-02-03 MED ORDER — FUROSEMIDE 10 MG/ML IJ SOLN
40.0000 mg | Freq: Once | INTRAMUSCULAR | Status: AC
  Administered 2022-02-03: 40 mg via INTRAVENOUS
  Filled 2022-02-03: qty 4

## 2022-02-03 MED ORDER — ALBUTEROL SULFATE (2.5 MG/3ML) 0.083% IN NEBU
2.5000 mg | INHALATION_SOLUTION | Freq: Once | RESPIRATORY_TRACT | Status: AC
  Administered 2022-02-03: 2.5 mg via RESPIRATORY_TRACT
  Filled 2022-02-03: qty 3

## 2022-02-03 MED ORDER — PROCHLORPERAZINE EDISYLATE 10 MG/2ML IJ SOLN
10.0000 mg | Freq: Once | INTRAMUSCULAR | Status: AC
  Administered 2022-02-03: 10 mg via INTRAVENOUS
  Filled 2022-02-03: qty 2

## 2022-02-03 NOTE — Consult Note (Addendum)
NEUROLOGY CONSULTATION NOTE   Date of service: February 03, 2022 Patient Name: [name] MRN:  [id number] DOB:  1966/09/06 Reason for consult: "headache" Requesting Provider: Leroy Sea, MD _ _ _   _ __   _ __ _ _  __ __   _ __   __ _  History of Present Illness  [name] is a 56 year old male with a past medical history of previous CVA with right-sided hemiplegia (uses a wheelchair at baseline), HFrEF (last EF less than 20%), coronary artery disease, well-controlled type 2 diabetes, cocaine use disorder, and schizoaffective disorder.  He presented to the emergency department with dysarthria on 3/1.  We have been consulted for new onset severe headache.  On interview this morning, the patient exhibits prominent dysarthria but is found to be alert and oriented to person, place, and time.  He is in obvious discomfort, constantly moving around the bed.  The patient provides a history with some difficulty, but is able to relate that his headache has been going on for approximately 2 weeks and that it only occurs when he is sitting up.  He states the headache is constant in nature when he is sitting up.  It is generalized, and does not localize to one side of his head.  He denies any associated aura or photophobia.  He reports some nausea that has been occurring even before the headache started.    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   Procedure: AMPUTATION TOE;  Surgeon: Park Liter, DPM;  Location: [facility] OR;  Service: Podiatry;  Laterality:  Bilateral;  Ok to leave patient on hospital bed   IR ANGIO INTRA EXTRACRAN SEL COM CAROTID INNOMINATE BILAT MOD SED  12/17/2020   IR ANGIO VERTEBRAL SEL VERTEBRAL BILAT MOD SED  12/17/2020   RADIOLOGY WITH ANESTHESIA N/A 12/17/2020   Procedure: IR WITH ANESTHESIA;  Surgeon: Radiologist, Medication, MD;  Location: [facility] OR;  Service: Radiology;  Laterality: N/A;   Family History  Problem Relation Age of Onset   Drug abuse Mother    Suicidality Mother    Social History   Socioeconomic History   Marital status: Single    Spouse name: Not on file   Number of children: 4   Years of education: Not on file   Highest education level: Not on file  Occupational History   Occupation: N/A  Tobacco Use   Smoking status: Every Day    Years: 30.00    Types: Cigarettes   Smokeless tobacco: Never  Vaping Use   Vaping Use: Never used  Substance and Sexual Activity   Alcohol use: Yes    Comment: infrequently   Drug use: Yes    Types: Cocaine, Marijuana, "Crack" cocaine   Sexual activity: Not Currently    Partners: Female    Birth control/protection: None  Other Topics Concern   Not on file  Social History Narrative   ** Merged History Encounter **       Social Determinants of Health  Financial Resource Strain: Not on file  Food Insecurity: Not on file  Transportation Needs: Not on file  Physical Activity: Not on file  Stress: Not on file  Social Connections: Not on file   Allergies  Allergen Reactions   Ibuprofen Anaphylaxis   Acetaminophen Hives, Nausea And Vomiting and Rash    Pt overdosed on bottle of APAP 05/06/16    Lyrica Cr [Pregabalin Er] Other (See Comments)    Dystonia and dysarthria   Peanut-Containing Drug Products Other (See Comments)    Pt denies allergy to peanut butter 03/02/2021    Medications   Medications Prior to Admission  Medication Sig Dispense Refill Last Dose   oxyCODONE (OXY IR/ROXICODONE) 5 MG immediate release tablet Take 1  tablet (5 mg total) by mouth every 6 (six) hours as needed for severe pain or breakthrough pain. 20 tablet 0 Past Week   albuterol (VENTOLIN HFA) 108 (90 Base) MCG/ACT inhaler Inhale 2 puffs into the lungs every 6 (six) hours as needed for wheezing or shortness of breath. (Patient not taking: Reported on 01/31/2022) 8 g 2 Not Taking   atorvastatin (LIPITOR) 10 MG tablet Take 4 tablets (40 mg total) by mouth daily. (Patient not taking: Reported on 01/31/2022) 120 tablet 0 Not Taking   carvedilol (COREG) 6.25 MG tablet Take 1 tablet (6.25 mg total) by mouth 2 (two) times daily with a meal. (Patient not taking: Reported on 01/29/2022) 60 tablet 2 Not Taking   levETIRAcetam (KEPPRA) 500 MG tablet Take 1 tablet (500 mg total) by mouth 2 (two) times daily. (Patient not taking: Reported on 01/31/2022) 60 tablet 0 Not Taking   OLANZapine (ZYPREXA) 5 MG tablet Take 1 tablet (5 mg total) by mouth daily. (Patient not taking: Reported on 12/03/2021) 14 tablet 0 Not Taking   spironolactone (ALDACTONE) 25 MG tablet Take 1 tablet (25 mg total) by mouth daily. (Patient not taking: Reported on 01/31/2022) 30 tablet 3 Not Taking   umeclidinium bromide (INCRUSE ELLIPTA) 62.5 MCG/INH AEPB Inhale 1 puff into the lungs daily. (Patient not taking: Reported on 11/24/2021) 30 each 2 Not Taking     Vitals   Vitals:   02/02/22 2209 02/03/22 0500 02/03/22 0623 02/03/22 1029  BP: (!) 155/87  (!) 148/104 (!) 163/94  Pulse: 70  80 92  Resp: 19  20 20   Temp: 98.1 F (36.7 C)  98.2 F (36.8 C) 97.8 F (36.6 C)  TempSrc: Oral  Oral Oral  SpO2: 92%  96% 100%  Weight:  91.6 kg       Body mass index is 29.82 kg/m.  Physical Exam   General: elderly male, lying in bed, in obvious discomfort HENT: Normal oropharynx and mucosa. Normal external appearance of ears and nose.  Neck: Supple, no pain or tenderness  CV: No peripheral edema.  Pulmonary: Symmetric Chest rise. Normal respiratory effort.  Abdomen: Soft to touch,  non-tender.  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 Normal palpation of skin.  Musculoskeletal: Normal digits and nails by inspection.  Neurologic Examination  Mental status/Cognition: Alert, oriented to self, place, month and year, good attention.  Speech/language: Fluent, comprehension intact. Cranial nerves:   CN II Pupils equal and reactive to light, no VF deficits    CN III,IV,VI EOM intact, no gaze preference or deviation, no nystagmus    CN V untested   CN VII no asymmetry, no nasolabial fold flattening    CN VIII normal hearing to  speech    CN IX & X untested   CN XI untested   CN XII untested   Motor:   Mvmt Root Nerve  Muscle Right Left Comments  SA C5/6 Ax Deltoid 0/5 5/5   EF C5/6 Mc Biceps 0/5 5/5   EE C6/7/8 Rad Triceps 0/5 5/5   WF C6/7 Med FCR     WE C7/8 PIN ECU     F Ab C8/T1 U ADM/FDI     HF L1/2/3 Fem Illopsoas 0/5 5/5   KE L2/3/4 Fem Quad 0/5 5/5   DF L4/5 D Peron Tib Ant     PF S1/2 Tibial Grc/Sol      Reflexes:  Right Left Comments  Pectoralis      Biceps (C5/6)     Brachioradialis (C5/6)      Triceps (C6/7)      Patellar (L3/4)      Achilles (S1)      Hoffman      Plantar     Jaw jerk    Sensation:  Light touch Intact   Pin prick    Temperature    Vibration   Proprioception    Coordination/Complex Motor:  - Gait: deferred  Labs   CBC:  Recent Labs  Lab 02/02/22 0108 02/03/22 1017  WBC 11.4* 8.7  NEUTROABS 9.5* 5.3  HGB 12.9* 14.0  HCT 39.5 41.7  MCV 99.0 98.6  PLT 339 291    Basic Metabolic Panel:  Lab Results  Component Value Date   NA 138 02/03/2022   K 3.8 02/03/2022   CO2 25 02/03/2022   GLUCOSE 113 (H) 02/03/2022   BUN 25 (H) 02/03/2022   CREATININE 1.33 (H) 02/03/2022   CALCIUM 8.2 (L) 02/03/2022   GFRNONAA >60 02/03/2022   GFRAA >60 08/21/2020   Lipid Panel:  Lab Results  Component Value Date   LDLCALC 72 12/18/2020   HgbA1c:  Lab Results  Component  Value Date   HGBA1C 5.8 (H) 11/13/2021   Urine Drug Screen:     Component Value Date/Time   LABOPIA NONE DETECTED 01/29/2022 1028   COCAINSCRNUR POSITIVE (A) 01/29/2022 1028   COCAINSCRNUR POSITIVE (A) 12/25/2016 1533   LABBENZ NONE DETECTED 01/29/2022 1028   AMPHETMU NONE DETECTED 01/29/2022 1028   THCU NONE DETECTED 01/29/2022 1028   LABBARB NONE DETECTED 01/29/2022 1028    Alcohol Level     Component Value Date/Time   ETH 10 (H) 01/29/2022 1025    CT Head without contrast: IMPRESSION: 1. No acute intracranial pathology. 2. Unchanged remote infarcts in the bilateral cerebellar hemispheres.  MRI Brain: IMPRESSION: 1. No acute intracranial abnormality. 2. Moderate chronic small vessel ischemic disease. 3. Chronic cerebellar infarcts.  rEEG: NA   Impression  [name] is a 56 year old male with a past medical history of previous CVA with right-sided hemiplegia (uses a wheelchair at baseline), HFrEF (last EF less than 20%), coronary artery disease, well-controlled type 2 diabetes, cocaine use disorder, and schizoaffective disorder.  He presented to the emergency department with dysarthria on 3/1.  We have been consulted for new onset severe headache.  The patient's headache could be a migraine variant with some contribution from his elevated blood pressure (measured at 164/112 while we were in the room). Tension headache does not seem likely given the severity and cluster headache does not seem likely given lack of localization and lack of previous Hx. Dural venous sinus thrombosis is possible.  The positional symptoms of this headache are the opposite of  what is seen in idiopathic intracranial hypertension.  Giant cell arteritis is also unlikely given the lack of jaw claudication or severe visual symptoms.  Recommendations   -CT venogram to rule out dural venous sinus thrombosis -CTA of the head and neck to rule out stroke (MRI negative on 3/1) -Headache cocktail  x1 -Control of HTN per primary  Carlyn Reichert, MD PGY-1  Attending Neurohospitalist Addendum Patient seen and examined with APP/Resident. Agree with the history and physical as documented above. Agree with the plan as documented, which I helped formulate. I have independently reviewed the chart, obtained history, review of systems and examined the patient.I have personally reviewed pertinent head/neck/spine imaging (CT/MRI).  MRI brain negative for acute stroke.  Patient seen and examined at the request of Dr. Thedore Mins. Main complaint is headache-agree with the history and physical documented by Dr. Jerrel Ivory above. I do not suspect a pseudotumor/idiopathic intracranial hypertension type headache given his description. Unlikely giant cell arteritis given lack of jaw claudication or visual symptoms. Low pressure headache also less likely because he has never had any CSF drainage. I suspect his headache is multifactorial related to his severe hypertension as well as drug use in addition to possible medication side effects such as that of clonidine but I would not recommend changing any medications as he definitely needs better blood pressure management. I gave him a migraine cocktail-Compazine and Benadryl IV x1.  He is allergic to NSAIDs so I did not use Toradol. We can try other things such as IV Solu-Medrol and IV Depakote if the above does not break the cycle of pain. CTA and CTV to rule out vasculitic/vascular malformation/dissection or dural venous sinus thrombus related etiology for headache. I will continue to follow with you. Plan relayed to Dr. Thedore Mins.  Please feel free to call with any questions.  -- Milon Dikes, MD Neurologist [facility] Pager: [phone]

## 2022-02-03 NOTE — Consult Note (Signed)
Brief Psychiatry Consult Note  ?The patient was last seen by the psychiatry service on 02/01/22. Interim documentation by primary team and nursing staff has been reviewed. Was asked to see pt again by Dr. Thedore Mins to assess capacity - pt has been refusing (or delaying) medications and lab draws on multiple occasions. Per chart review, he had asked to delay lab draws until after breakfast and results were in by the time psychiatry is consulted.  ? ?On evaluation pt is upset  and surprised to see the psychiatry team. He states that he is here for "medical" reasons, and denies any SI/HI/AH/VH. Asked 2-3 times throughout assessment if pt is interested in restarting psychiatric medications which was declined each time. He states he feels that his pain from his headache is not fully addressed and that people keep giving him "Tylenol and Motrin" which he can't take (did not further explore this). He first denies that he refused atorvastatin and aspirin, stating he needs those for his heart; indicates he would take these in the future. He states that he doesn't take Keppra outside of the hospital as he has not had any seizures recently. Generally comments on a lack of communication from the medical team stating "if someone explains why they are giving me something I will take it". For the three decisions assessed (ie earlier refusal to take aspirin, lipitor, keppra) pt appeared to meet capacity criteria ie 1) communicating a choice, 2) understanding, 3) appreciation, and 4) rationalization/reasoning. ? ?As with all capacity evaluations capacity evaluation is time and decision specific Patients often have capacity to make some medical decisions (ie refuse lovenox) but not others (ie refuse emergent appendectomy). This patient has many risk factors for having a reduced level of decisional capacity, including h/o substance abuse, h/o psychotic illness (unclear if this exists separately from substance abuse) and h/o strokes. It is  recommended that the primary team serially re-evaluate capacity.  ? ?Recommendations: ? ?Capacity: ?1) Pt agreeable to take lipitor/aspirin; appeared to have capacity to refuse Keppra. ? ?Medications: ?1) May consider discussing depakote's indications for chronic headache with pt; this would allow irritability and seizures to be managed while aligning with pt's primary goals. Per chart has responded well to this in the past. ? ?Specific interventions for this pt: ?1) Offer pills one at a time with indication ie "This is the aspirin to prevent strokes" rather than "These are your morning meds" ?2) Schedule AM labs and medications no earlier than 9 AM (large part of conflict between pt and team appears to be related to being woken up from sleep for care) ? ?General recommendations for difficult patients:  ?2)  To minimize splitting of staff, assign one staff person (primary attending) as the individual that communicates all information from the team. ?3) Validate patient's anxieties and emotions. Do not validate the invalid; instead validate feelings, and correct assumptions. ?4) Patients with borderline personality traits/disorder often use the language of physical pain to communicate both physical and emotional suffering. It is important to address pain complaints as they arise and attempt to identify an etiology, either organic or psychiatric. In patient's with chronic pain, it is important to have a discussion with the patient about expectations about pain control. ? ? ?We will sign off at this time. This has been communicated to the primary team. If safety becomes a concern or pt is amenable to reconsult, may reconsult the Psychiatry Inpatient Consult Service.  ? ?Claris Che A Venesha Petraitis ? ?

## 2022-02-03 NOTE — Progress Notes (Signed)
Received patient to 5W8 from 6E. Belongings including wheelchair at bedside. Patient refusing telemetry leads be placed. Patient educated on high fall risk status and instructed to call staff prior to getting out of bed. Bed alarm on and audible. Will continue to monitor.  ?
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
## 2022-02-03 NOTE — Progress Notes (Signed)
Orthopedic Tech Progress Note ?Patient Details:  ?[name] ?06/17/1966 ?[id number] ? ?Ortho Devices ?Type of Ortho Device: Unna boot ?Ortho Device/Splint Location: RLE ?Ortho Device/Splint Interventions: Ordered, Application ?  ?Post Interventions ?Patient Tolerated: Well ?Instructions Provided: Care of device ? ?[name] ?02/03/2022, 5:05 PM ? ?

## 2022-02-03 NOTE — TOC Progression Note (Addendum)
Transition of Care (TOC) - Progression Note  ? ? ?Patient Details  ?Name: [name] ?MRN: [id number] ?Date of Birth: 1966-02-22 ? ?Transition of Care (TOC) CM/SW Contact  ?Delilah Shan, LCSWA ?Phone Number: ?02/03/2022, 12:20 PM ? ?Clinical Narrative:    ? ?Patient has no current SNF bed offers. [facility] confirmed cannot offer SNF bed due to substance use. [address] and [facility] confirmed cannot offer SNF bed for patient. Patients Passr number is pending. CSW uploaded requested clinicals to [facility] must for review. Patient has current barriers toSNFplacement,homeless,medicaid,substance use, and passr pending. CSW informed TOC leadership may be difficult to place. CSW will continue to follow and assist with patients dc planning needs. ? ?Expected Discharge Plan: Skilled Nursing Facility ?Barriers to Discharge: Continued Medical Work up, English as a second language teacher, Active Substance Use - Placement ? ?Expected Discharge Plan and Services ?Expected Discharge Plan: Skilled Nursing Facility ?In-house Referral: Clinical Social Work ?  ?  ?Living arrangements for the past 2 months: Homeless ?                ?  ?  ?  ?  ?  ?  ?  ?  ?  ?  ? ? ?Social Determinants of Health (SDOH) Interventions ?  ? ?Readmission Risk Interventions ?Readmission Risk Prevention Plan 12/05/2021 03/05/2021 01/07/2021  ?Transportation Screening Complete Complete Complete  ?PCP or Specialist Appt within 3-5 Days - - Complete  ?HRI or Home Care Consult - - Complete  ?Social Work Consult for Recovery Care Planning/Counseling - - Complete  ?Palliative Care Screening - - Complete  ?Medication Review Oceanographer) Not Complete Complete Complete  ?PCP or Specialist appointment within 3-5 days of discharge Not Complete Complete -  ?HRI or Home Care Consult Not Complete Complete -  ?SW Recovery Care/Counseling Consult Not Complete Complete -  ?Palliative Care Screening Not Applicable Not Applicable -  ?Skilled Nursing Facility  Not Applicable Complete -  ?Some recent data might be hidden  ? ? ?

## 2022-02-03 NOTE — Consult Note (Signed)
WOC Nurse Consult Note: ?Reason for Consult: Consult for bilateral leg wounds. Was just seen by our team 01/31/22 and Dr Lajoyce Corners 02/01/22 with orders for [facility] boot application.  ?Wound type: venous insufficiency.  ?Pressure Injury POA: NA ?Measurement: noted small cracked nonintact areas around bilateral malleoli.  Zinc wrap and compression ordered by Dr Lajoyce Corners for topical and edema management.  ?Periwound: noted brawny skin changes ?Dressing procedure/placement/frequency:Ortho tech team will manage Unna boot application with no topical therapy needed.  ?Will not follow at this time.  Please re-consult if needed.  ?Maple Hudson MSN, RN, FNP-BC CWON ?Wound, Ostomy, Continence Nurse ?Pager [phone]  ? ? ?  ?

## 2022-02-03 NOTE — Progress Notes (Signed)
Pt verbally expressed refusing medications to RN and tech at bedside. Pt is very upset and irritable.  ?

## 2022-02-03 NOTE — Progress Notes (Signed)
Messaged attending concerning head pressure when sitting up. Also advised that pt is stating that his oxycodone that he normally take at home is 15 mg and would like to know if this can be changed.   ?

## 2022-02-03 NOTE — Progress Notes (Signed)
Patient refused blood works. Requested to do it after breakfast. Phlebotomist and RN at bedside.  ?

## 2022-02-03 NOTE — Progress Notes (Signed)
Pt requesting second opinion. Attending made aware.  ?

## 2022-02-03 NOTE — Progress Notes (Signed)
?   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 ?Spiritual Care Consultation Request (01/31/2022) by Blossom Hoops, RN, stating that "Patient wants to discuss his spiritual life with a chaplain." Attempted to meet with Mr. Burnell Hurta at bedside. Patient stated that he did not request Chaplain visit. Patient said that he was dissatisfied with care and that they were trying to make him take medicine that he did not want. Asked him if he wanted to talk about that or anything else to which he answered "No." Spoke to patient's nurse who said that patient was irritated and had refused morning medication.  ?[address] [place], M. [place], [phone].    ?

## 2022-02-04 DIAGNOSIS — I5042 Chronic combined systolic (congestive) and diastolic (congestive) heart failure: Secondary | ICD-10-CM

## 2022-02-04 DIAGNOSIS — I160 Hypertensive urgency: Secondary | ICD-10-CM | POA: Diagnosis not present

## 2022-02-04 DIAGNOSIS — F1010 Alcohol abuse, uncomplicated: Secondary | ICD-10-CM

## 2022-02-04 DIAGNOSIS — F1410 Cocaine abuse, uncomplicated: Secondary | ICD-10-CM

## 2022-02-04 LAB — CBC WITH DIFFERENTIAL/PLATELET
Abs Immature Granulocytes: 0.04 10*3/uL (ref 0.00–0.07)
Basophils Absolute: 0 10*3/uL (ref 0.0–0.1)
Basophils Relative: 1 %
Eosinophils Absolute: 0.1 10*3/uL (ref 0.0–0.5)
Eosinophils Relative: 1 %
HCT: 45.3 % (ref 39.0–52.0)
Hemoglobin: 15.1 g/dL (ref 13.0–17.0)
Immature Granulocytes: 1 %
Lymphocytes Relative: 35 %
Lymphs Abs: 2.7 10*3/uL (ref 0.7–4.0)
MCH: 32.3 pg (ref 26.0–34.0)
MCHC: 33.3 g/dL (ref 30.0–36.0)
MCV: 96.8 fL (ref 80.0–100.0)
Monocytes Absolute: 0.7 10*3/uL (ref 0.1–1.0)
Monocytes Relative: 9 %
Neutro Abs: 4.1 10*3/uL (ref 1.7–7.7)
Neutrophils Relative %: 53 %
Platelets: 317 10*3/uL (ref 150–400)
RBC: 4.68 MIL/uL (ref 4.22–5.81)
RDW: 15.1 % (ref 11.5–15.5)
WBC: 7.6 10*3/uL (ref 4.0–10.5)
nRBC: 0 % (ref 0.0–0.2)

## 2022-02-04 LAB — COMPREHENSIVE METABOLIC PANEL
ALT: 12 U/L (ref 0–44)
AST: 25 U/L (ref 15–41)
Albumin: 3 g/dL — ABNORMAL LOW (ref 3.5–5.0)
Alkaline Phosphatase: 61 U/L (ref 38–126)
Anion gap: 13 (ref 5–15)
BUN: 21 mg/dL — ABNORMAL HIGH (ref 6–20)
CO2: 21 mmol/L — ABNORMAL LOW (ref 22–32)
Calcium: 8.5 mg/dL — ABNORMAL LOW (ref 8.9–10.3)
Chloride: 102 mmol/L (ref 98–111)
Creatinine, Ser: 1.15 mg/dL (ref 0.61–1.24)
GFR, Estimated: >60 mL/min (ref >60)
Glucose, Bld: 98 mg/dL (ref 70–99)
Potassium: 4.3 mmol/L (ref 3.5–5.1)
Sodium: 136 mmol/L (ref 135–145)
Total Bilirubin: 2.1 mg/dL — ABNORMAL HIGH (ref 0.3–1.2)
Total Protein: 6.1 g/dL — ABNORMAL LOW (ref 6.5–8.1)

## 2022-02-04 LAB — MAGNESIUM: Magnesium: 2.1 mg/dL (ref 1.7–2.4)

## 2022-02-04 LAB — BRAIN NATRIURETIC PEPTIDE: B Natriuretic Peptide: 624 pg/mL — ABNORMAL HIGH (ref 0.0–100.0)

## 2022-02-04 LAB — GLUCOSE, CAPILLARY
Glucose-Capillary: 140 mg/dL — ABNORMAL HIGH (ref 70–99)
Glucose-Capillary: 134 mg/dL — ABNORMAL HIGH (ref 70–99)
Glucose-Capillary: 115 mg/dL — ABNORMAL HIGH (ref 70–99)
Glucose-Capillary: 142 mg/dL — ABNORMAL HIGH (ref 70–99)

## 2022-02-04 MED ORDER — FUROSEMIDE 20 MG PO TABS
20.0000 mg | ORAL_TABLET | Freq: Every day | ORAL | Status: DC
Start: 2022-02-04 — End: 2022-02-05
  Administered 2022-02-04: 20 mg via ORAL
  Filled 2022-02-04 (×2): qty 1

## 2022-02-04 MED ORDER — SODIUM CHLORIDE 0.9 % IV SOLN
500.0000 mg | Freq: Once | INTRAVENOUS | Status: AC
  Administered 2022-02-04: 500 mg via INTRAVENOUS
  Filled 2022-02-04 (×3): qty 8

## 2022-02-04 MED ORDER — TOPIRAMATE 25 MG PO TABS: 25.0000 mg | ORAL_TABLET | Freq: Two times a day (BID) | ORAL | Status: DC

## 2022-02-04 MED ORDER — ISOSORB DINITRATE-HYDRALAZINE 20-37.5 MG PO TABS
2.0000 | ORAL_TABLET | Freq: Three times a day (TID) | ORAL | Status: DC
  Administered 2022-02-04: 2 via ORAL
  Filled 2022-02-04 (×2): qty 2

## 2022-02-04 MED ORDER — TOPIRAMATE 25 MG PO TABS: 50.0000 mg | ORAL_TABLET | Freq: Two times a day (BID) | ORAL | Status: DC

## 2022-02-04 MED ORDER — SPIRONOLACTONE 12.5 MG HALF TABLET
12.5000 mg | ORAL_TABLET | Freq: Every day | ORAL | Status: DC
  Administered 2022-02-04: 12.5 mg via ORAL
  Filled 2022-02-04 (×2): qty 1

## 2022-02-04 MED ORDER — TOPIRAMATE 25 MG PO TABS
25.0000 mg | ORAL_TABLET | Freq: Every day | ORAL | Status: DC
Start: 2022-02-18 — End: 2022-02-04

## 2022-02-04 MED ORDER — TOPIRAMATE 25 MG PO TABS
25.0000 mg | ORAL_TABLET | Freq: Every day | ORAL | Status: DC
  Administered 2022-02-04: 25 mg via ORAL
  Filled 2022-02-04 (×2): qty 1

## 2022-02-04 MED ORDER — TOPIRAMATE 25 MG PO TABS: 50.0000 mg | ORAL_TABLET | Freq: Every day | ORAL | Status: DC

## 2022-02-04 MED ORDER — TOPIRAMATE 25 MG PO TABS: 25.0000 mg | ORAL_TABLET | Freq: Every day | ORAL | Status: DC

## 2022-02-04 NOTE — Consult Note (Addendum)
Cardiology Consultation:   Patient ID: [name] MRN: [id number]; DOB: 07-Dec-1965  Admit date: 01/29/2022 Date of Consult: 02/04/2022  PCP:  Merryl Hacker, No   [facility] Providers Cardiologist:  Minus Breeding, MD   Patient Profile:   [name] is a 56 y.o. male with a hx of CAD, chronic combined systolic and diastolic heart failure, hypertension, hyperlipidemia, COPD, asthma, schizoaffective disorder, cocaine abuse, alcohol abuse, and tobacco abuse who is being seen 02/04/2022 for the evaluation of CHF at the request of Dr. Waldron Labs.  History of Present Illness:   [name] had documented systolic heart failure in 2018 with an LVEF of 35 to 40%.  Unfortunately repeat echocardiogram in 03/2019 showed an LVEF of 15%.  He underwent a heart catheterization at [facility] 10/2019 that showed diffuse atherosclerosis in all coronary distributions with the worst lesion 60 to 70% in the proximal circumflex.  Degree of cardiomyopathy felt greater than the degree of CAD suggesting a nonischemic etiology.  He underwent coronary CTA 03/2020 which revealed moderate to severe RCA, LAD, and circumflex disease.  He was recommended for a functional assessment however left AMA prior to this evaluation.  Nuclear stress test was deferred to the outpatient setting for wheezing on exam.  He did not follow-up in clinic.  Overall he was felt to be a poor candidate for invasive ischemic testing due to noncompliance and cocaine abuse.  Echocardiogram 09/26/2020 showed an LVEF of 40 to 45%, up from 15% 03/2019, mild concentric LVH, grade 2 diastolic dysfunction, no regional wall motion abnormality, moderate LAE, mild RAE, and trivial MR.  He has reported anaphylaxis to NSAIDs, aspirin was avoided and Plavix was started given the extent of his CAD.  He was continued on statin.  Goal-directed medical therapy for his heart failure included hydralazine and Imdur.  Beta-blocker not started due to cocaine history.  ACE  I/ARB/ARNI/Aldactone not started due to labile renal function and lack of follow-up.  He has apparently been homeless since 2013.  He has a history of multiple ER visits for suicidal ideation with a schizoaffective disorder.  He also has documented history of refusing medications and evaluation by psychiatry.   He was last seen by cardiology during the hospitalization in January 2022 when he was found down, initially called as a code stroke, confirmed on head imaging presumed cardioembolic.  He was discharged on aspirin and Lipitor.  He was also discharged on carvedilol, Entresto, Imdur, hydralazine, Lasix, and spironolactone.  Since that time he has had multiple ER visits.  He presented to [facility] ED 01/29/2022 with headache, slurred speech, and hypertensive urgency.  MRI brain was negative for acute stroke.  He was also treated for COPD exacerbation and has been tapering off steroids.  Wheezing felt likely due to CHF.  Echocardiogram repeated 01/30/2022 and showed LVEF less than 20%, mild LVH, grade 3 diastolic dysfunction, elevated LVEDP, RV systolic pressure [id number] mmHg, biatrial enlargement, and mild to moderate MR.   Cardiology was consulted for management of his heart failure.  He has been verbally abusive to staff and has refused medications and testing.  Psych, dietary was consulted and deemed the patient has capacity to refuse medication and treatment.  He was placed on a CIWA protocol and has refused Librium.  He has been counseled to stop cocaine, alcohol and cigarette smoking.  Ortho has been consulted for chronic lower extremity venous stasis ulcers present on admission.  Patient has refused social work placement after discharge.  BNP 1100 AKI has resolved  1.69 --> 1.15  He was diuresed with 20 mg and then 40 mg IV lasix and has now been switched to 20 mg PO lasix. He is overall net negative 10 L with 5.7 cc urine output yesterday. Weight is actually up from discharge.   Currently on 325 mg ASA,  40 mg lipitor, 20-37.5 mg bidil, and 12.5 mg spironolactone.   Clonidine discontinued.   CTA and venogram for headache negative for large vessel occlusion. 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  During my exam, he would not answer questions regarding symptoms. He complained of not being able to get medications and no one explaining what medications he was on and what they were for - I explained all cardiac medications and tried to discuss how we can overcome barriers to getting his medications. He informed me he had a SW and dismissed me from the room.   Past Medical History:  Diagnosis Date   Asthma    Bipolar 1 disorder ([place])    CHF (congestive heart failure) (HCC)    COPD (chronic obstructive pulmonary disease) ([place])    Depression    Diabetes mellitus without complication (HCC)    GERD (gastroesophageal reflux disease)    Homelessness    Hypertension    QT prolongation    Schizoaffective disorder [facility])     Past Surgical History:  Procedure Laterality Date   AMPUTATION TOE Bilateral 01/02/2021   Procedure: AMPUTATION TOE;  Surgeon: Evelina Bucy, DPM;  Location: [place];  Service: Podiatry;  Laterality: Bilateral;  Ok to leave patient on hospital bed   IR ANGIO INTRA EXTRACRAN SEL COM CAROTID INNOMINATE BILAT MOD SED  12/17/2020   IR ANGIO VERTEBRAL SEL VERTEBRAL BILAT MOD SED  12/17/2020   RADIOLOGY WITH ANESTHESIA N/A 12/17/2020   Procedure: IR WITH ANESTHESIA;  Surgeon: Radiologist, Medication, MD;  Location: [place];  Service: Radiology;  Laterality: N/A;     Home Medications:  Prior to Admission medications   Medication Sig Start Date End Date Taking? Authorizing Provider  oxyCODONE (OXY IR/ROXICODONE) 5 MG immediate release tablet Take 1 tablet (5 mg total) by mouth every 6 (six) hours as needed for severe pain or breakthrough pain. 12/09/21  Yes Lama, Marge Duncans, MD  albuterol (VENTOLIN HFA) 108 (90 Base) MCG/ACT inhaler Inhale 2 puffs into the lungs every 6 (six) hours  as needed for wheezing or shortness of breath. Patient not taking: Reported on 01/31/2022 11/09/21   Wyvonnia Dusky, MD  atorvastatin (LIPITOR) 10 MG tablet Take 4 tablets (40 mg total) by mouth daily. Patient not taking: Reported on 01/31/2022 11/09/21   Wyvonnia Dusky, MD  carvedilol (COREG) 6.25 MG tablet Take 1 tablet (6.25 mg total) by mouth 2 (two) times daily with a meal. Patient not taking: Reported on 01/29/2022 12/09/21   [name], MD  levETIRAcetam (KEPPRA) 500 MG tablet Take 1 tablet (500 mg total) by mouth 2 (two) times daily. Patient not taking: Reported on 01/31/2022 11/09/21 04/05/22  Wyvonnia Dusky, MD  OLANZapine (ZYPREXA) 5 MG tablet Take 1 tablet (5 mg total) by mouth daily. Patient not taking: Reported on 12/03/2021 11/25/21   Ethelene Hal, NP  spironolactone (ALDACTONE) 25 MG tablet Take 1 tablet (25 mg total) by mouth daily. Patient not taking: Reported on 01/31/2022 12/09/21   [name], MD  umeclidinium bromide (INCRUSE ELLIPTA) 62.5 MCG/INH AEPB Inhale 1 puff into the lungs daily. Patient not taking: Reported on 11/24/2021 11/16/20   Pokhrel, [place],  MD    Inpatient Medications: Scheduled Meds:  amLODipine  5 mg Oral Daily   aspirin  325 mg Oral Daily   atorvastatin  40 mg Oral Daily   enoxaparin (LOVENOX) injection  40 mg Subcutaneous [id number]   folic acid  1 mg Oral Daily   furosemide  20 mg Oral Daily   insulin aspart  0-9 Units Subcutaneous TID WC   isosorbide-hydrALAZINE  1 tablet Oral TID   levETIRAcetam  500 mg Oral BID   multivitamin with minerals  1 tablet Oral Daily   pantoprazole  40 mg Oral Daily   spironolactone  12.5 mg Oral Daily   thiamine  100 mg Oral Daily   topiramate  25 mg Oral QHS   [START ON 02/11/2022] topiramate  25 mg Oral BID   [START ON 02/19/2022] topiramate  25 mg Oral Daily   [START ON 02/18/2022] topiramate  50 mg Oral QHS   [START ON 02/25/2022] topiramate  50 mg Oral BID   umeclidinium bromide  1 puff Inhalation Daily    Continuous Infusions:  methylPREDNISolone (SOLU-MEDROL) injection     PRN Meds: albuterol, cloNIDine, diphenhydrAMINE-zinc acetate, hydrALAZINE, oxyCODONE, traMADol  Allergies:    Allergies  Allergen Reactions   Ibuprofen Anaphylaxis   Acetaminophen Hives, Nausea And Vomiting and Rash    Pt overdosed on bottle of APAP 05/06/16    Lyrica Cr [Pregabalin Er] Other (See Comments)    Dystonia and dysarthria   Peanut-Containing Drug Products Other (See Comments)    Pt denies allergy to peanut butter 03/02/2021    Social History:   Social History   Socioeconomic History   Marital status: Single    Spouse name: Not on file   Number of children: 4   Years of education: Not on file   Highest education level: Not on file  Occupational History   Occupation: N/A  Tobacco Use   Smoking status: Every Day    Years: 30.00    Types: Cigarettes   Smokeless tobacco: Never  Vaping Use   Vaping Use: Never used  Substance and Sexual Activity   Alcohol use: Yes    Comment: infrequently   Drug use: Yes    Types: Cocaine, Marijuana, "Crack" cocaine   Sexual activity: Not Currently    Partners: Female    Birth control/protection: None  Other Topics Concern   Not on file  Social History Narrative   ** Merged History Encounter **       Social Determinants of Health   Financial Resource Strain: Not on file  Food Insecurity: Not on file  Transportation Needs: Not on file  Physical Activity: Not on file  Stress: Not on file  Social Connections: Not on file  Intimate Partner Violence: Not on file    Family History:    Family History  Problem Relation Age of Onset   Drug abuse Mother    Suicidality Mother      ROS:  Please see the history of present illness.   All other ROS reviewed and negative.     Physical Exam/Data:   Vitals:   02/03/22 2359 02/04/22 0352 02/04/22 0809 02/04/22 1209  BP: 120/75  132/86 (!) 149/104  Pulse: 88 72 98 89  Resp: 18  20 18   Temp: 98.1 F  (36.7 C)  97.8 F (36.6 C) 98.4 F (36.9 C)  TempSrc: Oral  Oral Oral  SpO2: 97% 98% 94% 91%  Weight:        Intake/Output Summary (Last  24 hours) at 02/04/2022 1531 Last data filed at 02/04/2022 1051 Gross per 24 hour  Intake 477 ml  Output 5650 ml  Net -5173 ml   Last 3 Weights 02/03/2022 02/02/2022 01/31/2022  Weight (lbs) 201 lb 15.1 oz 199 lb 15.3 oz 198 lb 3.1 oz  Weight (kg) 91.6 kg 90.7 kg 89.9 kg  Some encounter information is confidential and restricted. Go to Review Flowsheets activity to see all data.     Body mass index is 29.82 kg/m.  General:  no acute distress Lungs:  respirations unlabored Ext: legs wrapped Neuro:  CNs 2-12 intact, no focal abnormalities noted Psych:  forceful speech, did not stay on task  EKG:  The EKG was personally reviewed and demonstrates:  sinus rhythm, HR 97, left axis deviation Telemetry:  Telemetry was personally reviewed and demonstrates:  N/A  Relevant CV Studies:  Echo 06/05/2019 - [facility]         Pericardium:  No significant pericardial effusion          LV function:  Severely depressed (< 30% EF)          Aortic Root:  Normal (<4.0 cm)          RV:  Dilated          IVC collapsibility:  Plethoric   Heart cath - 10/07/2019 at [facility] Diffuse atherosclerosis throughout all coronary distributions  Worst lesion is a 60-70% of the prox LCx  Degree of CM is greater than degree of CAD, suggesting a non-ischemic etiology    Echo 01/30/2022: 1. LV apical false tendon (normal variant). Left ventricular ejection  fraction, by estimation, is <20%. The left ventricle has severely  decreased function. The left ventricle demonstrates global hypokinesis.  The left ventricular internal cavity size was   mildly dilated. There is mild left ventricular hypertrophy. Left  ventricular diastolic parameters are consistent with Grade III diastolic  dysfunction (restrictive). Elevated left ventricular end-diastolic  pressure. The E/e' is 46.   2. Right  ventricular systolic function is mildly reduced. The right  ventricular size is mildly enlarged. There is moderately elevated  pulmonary artery systolic pressure. The estimated right ventricular  systolic pressure is [id number] mmHg.   3. Left atrial size was moderately dilated.   4. Right atrial size was moderately dilated.   5. The mitral valve is abnormal. Mild to moderate mitral valve  regurgitation.   6. The aortic valve is tricuspid. Aortic valve regurgitation is not  visualized. Aortic valve sclerosis is present, with no evidence of aortic  valve stenosis.   7. Mildly dilated pulmonary artery.   8. The inferior vena cava is normal in size with <50% respiratory  variability, suggesting right atrial pressure of 8 mmHg.    Laboratory Data:  High Sensitivity Troponin:   Recent Labs  Lab 01/29/22 1024 01/29/22 1055  TROPONINIHS 47* 41*     Chemistry Recent Labs  Lab 02/02/22 0108 02/03/22 1017 02/04/22 0227  NA 138 138 136  K 4.0 3.8 4.3  CL 102 104 102  CO2 25 25 21*  GLUCOSE 110* 113* 98  BUN 28* 25* 21*  CREATININE 1.51* 1.33* 1.15  CALCIUM 8.5* 8.2* 8.5*  MG 1.9 2.1 2.1  GFRNONAA 55* >60 >60  ANIONGAP 11 9 13     Recent Labs  Lab 02/02/22 0108 02/03/22 1017 02/04/22 0227  PROT 6.1* 5.8* 6.1*  ALBUMIN 3.0* 2.9* 3.0*  AST 14* 15 25  ALT 19 16 12   ALKPHOS 70 61  61  BILITOT 0.3 0.7 2.1*   Lipids No results for input(s): CHOL, TRIG, HDL, LABVLDL, LDLCALC, CHOLHDL in the last 168 hours.  Hematology Recent Labs  Lab 02/02/22 0108 02/03/22 1017 02/04/22 0227  WBC 11.4* 8.7 7.6  RBC 3.99* 4.23 4.68  HGB 12.9* 14.0 15.1  HCT 39.5 41.7 45.3  MCV 99.0 98.6 96.8  MCH 32.3 33.1 32.3  MCHC 32.7 33.6 33.3  RDW 15.5 15.1 15.1  PLT 339 291 317   Thyroid No results for input(s): TSH, FREET4 in the last 168 hours.  BNP Recent Labs  Lab 02/02/22 0108 02/03/22 1017 02/04/22 0729  BNP 561.2* 776.3* 624.0*    DDimer No results for input(s): DDIMER in the last  168 hours.   Radiology/Studies:  CT ANGIO HEAD NECK W WO CM  Result Date: 02/04/2022 CLINICAL DATA:  Initial evaluation for acute headache. EXAM: CT ANGIOGRAPHY HEAD AND NECK CT VENOGRAM HEAD TECHNIQUE: Multidetector CT imaging of the head and neck was performed using the standard protocol during bolus administration of intravenous contrast. Multiplanar CT image reconstructions and MIPs were obtained to evaluate the vascular anatomy. Carotid stenosis measurements (when applicable) are obtained utilizing NASCET criteria, using the distal internal carotid diameter as the denominator. RADIATION DOSE REDUCTION: This exam was performed according to the departmental dose-optimization program which includes automated exposure control, adjustment of the mA and/or kV according to patient size and/or use of iterative reconstruction technique. CONTRAST:  87mL OMNIPAQUE IOHEXOL 350 MG/ML SOLN COMPARISON:  MRI FROM 01/29/2022. FINDINGS: CT HEAD FINDINGS Brain: Cerebral volume within normal limits. 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 No acute intracranial hemorrhage. No acute large vessel territory infarct. No mass lesion or midline shift. No hydrocephalus or extra-axial fluid collection. Vascular: No hyperdense vessel. Scattered vascular calcifications noted within the carotid siphons. Skull: Scalp soft tissues and calvarium within normal limits. Sinuses: Clear. 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 Otherwise unremarkable. Review of the MIP images confirms the above findings CTA NECK FINDINGS Aortic arch: Visualized aortic arch normal in caliber with normal branch pattern. Mild atheromatous change about the arch and origin of the great vessels without significant stenosis. Right carotid system: Right CCA patent from its origin to the bifurcation without stenosis. Bulky calcified plaque about the right carotid bulb  with associated severe short-segment stenosis of up to 80% by NASCET criteria. Right ICA patent distally without stenosis or dissection. Left carotid system: Left CCA patent from its origin to the bifurcation without stenosis. Eccentric calcified plaque about the left carotid bulb/proximal left ICA without hemodynamically significant stenosis. Left ICA patent distally without stenosis or dissection. Vertebral arteries: Both vertebral arteries arise from the subclavian arteries. No significant proximal subclavian artery stenosis. Severe ostial stenosis noted at the origin of the left vertebral artery (series 11, image 279). Vertebral arteries otherwise patent without stenosis or dissection. Skeleton: No discrete or worrisome osseous lesions. Poor dentition noted. Other neck: No other acute soft tissue abnormality within the neck. Upper chest: Emphysematous changes noted within the visualized lungs. Visualized upper chest demonstrates no other acute finding. Review of the MIP images confirms the above findings CTA HEAD FINDINGS Anterior circulation: Petrous segments patent bilaterally. Mild atheromatous change seen throughout the carotid siphons without hemodynamically significant stenosis. A1 segments patent bilaterally. Right A1 hypoplastic. Normal anterior communicating artery complex. Anterior cerebral arteries patent to their distal aspects without stenosis. No M1 stenosis or occlusion. Normal MCA bifurcations. Distal MCA branches perfused and  symmetric. Posterior circulation: Both V4 segments patent without stenosis. Right vertebral artery dominant. Both PICA patent at their origins. Basilar patent to its distal aspect without stenosis. Superior cerebellar arteries patent bilaterally. Both PCAs primarily supplied via the basilar. Mild atheromatous irregularity within the left greater than right PCAs without hemodynamically significant stenosis. Venous sinuses: Normal enhancement seen throughout the superior  sagittal sinus to the torcula. Transverse and sigmoid sinuses are patent as are the visualized internal jugular veins. Straight sinus, vein of Galen, internal cerebral veins, and basal veins of Rosenthal appear patent. No appreciable abnormality about the cavernous sinus. No dural venous sinus thrombosis. Anatomic variants: As above. No aneurysm or other vascular malformation. Review of the MIP images confirms the above findings IMPRESSION: CT HEAD: 1. No acute intracranial abnormality. 2. 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 CTA HEAD AND NECK: 1. Negative CTA for large vessel occlusion. No intracranial aneurysm. 2. Bulky calcified plaque about the right carotid bulb with associated severe short-segment stenosis of up to 80% by NASCET criteria. 3. Severe ostial stenosis at the origin of the left vertebral artery. Otherwise wide patency of the vertebrobasilar system. 4. Mild atheromatous change elsewhere about the major arterial vasculature of the head and neck as above. No other proximal high-grade or correctable stenosis. 5. Emphysema (ICD10-J43.9). CT VENOGRAM: Negative CT venogram.  No evidence for dural sinus thrombosis. Electronically Signed   By: Jeannine Boga M.D.   On: 02/04/2022 00:35   DG Chest Port 1 View  Result Date: 02/02/2022 CLINICAL DATA:  56 year old male with shortness of breath. Combative. EXAM: PORTABLE CHEST 1 VIEW COMPARISON:  Portable chest 01/29/2022 and earlier. FINDINGS: Portable AP semi upright view at 0724 hours. Lower lung volumes. Stable cardiomegaly and mediastinal contours. Continued patchy asymmetric perhaps interstitial opacity in the right lung. Trace pleural fluid in the right minor fissure. Increased background vascular congestion versus crowding of lung markings. No pneumothorax, consolidation, or layering pleural fluid. Visualized tracheal air column is within normal limits. No acute osseous abnormality  identified. Paucity of bowel gas in the upper abdomen. IMPRESSION: Cardiomegaly with some vascular congestion and continued indistinct asymmetric right lung opacity with trace fluid in the minor fissure. Favor asymmetric pulmonary edema over viral/atypical respiratory infection. Electronically Signed   By: Genevie Ann M.D.   On: 02/02/2022 07:42   CT VENOGRAM HEAD  Result Date: 02/04/2022 CLINICAL DATA:  Initial evaluation for acute headache. EXAM: CT ANGIOGRAPHY HEAD AND NECK CT VENOGRAM HEAD TECHNIQUE: Multidetector CT imaging of the head and neck was performed using the standard protocol during bolus administration of intravenous contrast. Multiplanar CT image reconstructions and MIPs were obtained to evaluate the vascular anatomy. Carotid stenosis measurements (when applicable) are obtained utilizing NASCET criteria, using the distal internal carotid diameter as the denominator. RADIATION DOSE REDUCTION: This exam was performed according to the departmental dose-optimization program which includes automated exposure control, adjustment of the mA and/or kV according to patient size and/or use of iterative reconstruction technique. CONTRAST:  82mL OMNIPAQUE IOHEXOL 350 MG/ML SOLN COMPARISON:  MRI FROM 01/29/2022. FINDINGS: CT HEAD FINDINGS Brain: Cerebral volume within normal limits. 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 No acute intracranial hemorrhage. No acute large vessel territory infarct. No mass lesion or midline shift. No hydrocephalus or extra-axial fluid collection. Vascular: No hyperdense vessel. Scattered vascular calcifications noted within the carotid siphons. Skull: Scalp soft tissues and calvarium within normal limits. Sinuses: Clear. Orbits: Chronic posttraumatic defect noted  at the left lamina papyracea. Otherwise unremarkable. Review of the MIP images confirms the above findings CTA NECK FINDINGS Aortic arch: Visualized  aortic arch normal in caliber with normal branch pattern. Mild atheromatous change about the arch and origin of the great vessels without significant stenosis. Right carotid system: Right CCA patent from its origin to the bifurcation without stenosis. Bulky calcified plaque about the right carotid bulb with associated severe short-segment stenosis of up to 80% by NASCET criteria. Right ICA patent distally without stenosis or dissection. Left carotid system: Left CCA patent from its origin to the bifurcation without stenosis. Eccentric calcified plaque about the left carotid bulb/proximal left ICA without hemodynamically significant stenosis. Left ICA patent distally without stenosis or dissection. Vertebral arteries: Both vertebral arteries arise from the subclavian arteries. No significant proximal subclavian artery stenosis. Severe ostial stenosis noted at the origin of the left vertebral artery (series 11, image 279). Vertebral arteries otherwise patent without stenosis or dissection. Skeleton: No discrete or worrisome osseous lesions. Poor dentition noted. Other neck: No other acute soft tissue abnormality within the neck. Upper chest: Emphysematous changes noted within the visualized lungs. Visualized upper chest demonstrates no other acute finding. Review of the MIP images confirms the above findings CTA HEAD FINDINGS Anterior circulation: Petrous segments patent bilaterally. Mild atheromatous change seen throughout the carotid siphons without hemodynamically significant stenosis. A1 segments patent bilaterally. Right A1 hypoplastic. Normal anterior communicating artery complex. Anterior cerebral arteries patent to their distal aspects without stenosis. No M1 stenosis or occlusion. Normal MCA bifurcations. Distal MCA branches perfused and symmetric. Posterior circulation: Both V4 segments patent without stenosis. Right vertebral artery dominant. Both PICA patent at their origins. Basilar patent to its distal  aspect without stenosis. Superior cerebellar arteries patent bilaterally. Both PCAs primarily supplied via the basilar. Mild atheromatous irregularity within the left greater than right PCAs without hemodynamically significant stenosis. Venous sinuses: Normal enhancement seen throughout the superior sagittal sinus to the torcula. Transverse and sigmoid sinuses are patent as are the visualized internal jugular veins. Straight sinus, vein of Galen, internal cerebral veins, and basal veins of Rosenthal appear patent. No appreciable abnormality about the cavernous sinus. No dural venous sinus thrombosis. Anatomic variants: As above. No aneurysm or other vascular malformation. Review of the MIP images confirms the above findings IMPRESSION: CT HEAD: 1. No acute intracranial abnormality. 2. 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 CTA HEAD AND NECK: 1. Negative CTA for large vessel occlusion. No intracranial aneurysm. 2. Bulky calcified plaque about the right carotid bulb with associated severe short-segment stenosis of up to 80% by NASCET criteria. 3. Severe ostial stenosis at the origin of the left vertebral artery. Otherwise wide patency of the vertebrobasilar system. 4. Mild atheromatous change elsewhere about the major arterial vasculature of the head and neck as above. No other proximal high-grade or correctable stenosis. 5. Emphysema (ICD10-J43.9). CT VENOGRAM: Negative CT venogram.  No evidence for dural sinus thrombosis. Electronically Signed   By: Jeannine Boga M.D.   On: 02/04/2022 00:35     Assessment and Plan:   CAD - cath and CCTA with coronary artery disease, was not obstructive on heart cath in 2020 - not a candidate for PCI given noncompliance, ongoing cocaine abuse, and homelessness - maintained on 10 mg lipitor - unclear if he was taking an antiplatelet - no chest pain   Acute on chronic systolic and diastolic heart  failure Nonischemic cardiomyopathy Hypertensive emergency - LVEF as low as 15%, most  recently LVEF less than 20% - GDMT PTA coreg and spironolactone despite previously being discharged on Entresto, hydralazine, Imdur, and Lasix - he is now on coreg, bidil, spironolactone, and lasix - I agree with these - tried to discuss barriers to medications - he states he can get to appt, he has medicaid, but can't get around in his wheelchair bc he has no one to push his wheelchair - during exam, he poured 2 packets of salt on his french fries   Cocaine abuse, alcohol abuse, nicotine abuse Hx of suicidal ideation Schizoaffective disorder Homelessness Noncompliance  - will be challenging to get medications, not sure he will be compliant     Risk Assessment/Risk Scores:       For questions or updates, please contact [facility] Please consult [email or web address] for contact info under    Signed, Ledora Bottcher, PA  02/04/2022 3:31 PM  Patient seen and examined   I agree with findings of [facility] above Pt is a 56 yo with hx of HTN, DM, schizoaffective disorder, drug / alcohol/ tobacco abuse,  CAD ([place] 2020 showed mod dz; CT coronary angio in 2021 showed mod to severe 3V Dz; pt left AMA before functional evaluation, did not follow up as outpat)   Pt also has a hx of systolic CHF (Echo [id number] LVEF 15%   Repeat Echo in 2021 LVEF 40 to 45%)    Presented to ED on 3/1 with HA and slurred speech.   Hypertensive urgency     Treated for COPD and CHF   Echo on 01/30/22 LVEF less than 20%   elevated LVEDP     Since admit he has been diuresed. 10 L            The pt would not allow [facility] examine him, but he allowed me to perform an exam He appeared comfortable, layig I bed HEENT  NCAT Neck:   JVP normal   No bruits Lungs   Rhonchi bilaterally  Moving air well   No rales Cardiac   RRR  No S3   No significant murmurs   ABd   Nl BS   Nontender   No hepatomegaly Ext are without edema   2+ DP    s/p amputation R gr  toe  Impression:   Pt's current volume status looks good    I would keep on same regimen    He is on BiDil, spironolactone,.  Also on amlodipine.  Will increase BiDil to 2 tabs every 8 hours  for better afterload reduction    Need to confirm that patient can get these meds       Ultimately the question is whether patient will f/u as outpt, whether he will take meds as directed.    He has given me the name of the social worker who works with him     [name]   [phone]     She is in charge of meds, rides.   I will try to call her tomorrow.  From cardiac standpoint OK to d/c home    Will work for follow up with social worker    Dorris Carnes MD

## 2022-02-04 NOTE — Progress Notes (Signed)
Occupational Therapy Treatment ?Patient Details ?Name: [name] ?MRN: [id number] ?DOB: 1966/04/17 ?Today's Date: 02/04/2022 ? ? ?History of present illness 56 y.o. male admitted 3/1 with slurred speech and HTN emergency. MRI head (-). PMHx: asthma, bipolar 1 disorder, depression, chronic combined systolic and diastolic CHF, COPD, GERD, homelessness, HTN, schizoaffective disorder, ETOH and cocaine abuse, CAD ?  ?OT comments ? Patient seated up in recliner upon arrival stating he felt better than earlier with PT. Patient performed sit to stand from recliner to RW with min guard.  LB dressing address with donning and doffing paper scrub bottoms and toilet transfer address with simulated transfer using RW. Patient making good progress, acute OT to continue to follow.   ? ?Recommendations for follow up therapy are one component of a multi-disciplinary discharge planning process, led by the attending physician.  Recommendations may be updated based on patient status, additional functional criteria and insurance authorization. ?   ?Follow Up Recommendations ? Skilled nursing-short term rehab (<3 hours/day)  ?  ?Assistance Recommended at Discharge Frequent or constant Supervision/Assistance  ?Patient can return home with the following ? A little help with walking and/or transfers;A little help with bathing/dressing/bathroom;Assist for transportation ?  ?Equipment Recommendations ? None recommended by OT  ?  ?Recommendations for Other Services   ? ?  ?Precautions / Restrictions Precautions ?Precautions: Fall ?Precaution Comments: bil feet and lower leg wounds ?Restrictions ?Weight Bearing Restrictions: No ?Other Position/Activity Restrictions: tends to hold R leg up to avoid putting weight on it  ? ? ?  ? ?Mobility Bed Mobility ?Overal bed mobility: Modified Independent ?  ?  ?  ?  ?  ?  ?General bed mobility comments: seated in recliner ?  ? ?Transfers ?Overall transfer level: Needs assistance ?Equipment used: Rolling  walker (2 wheels) ?Transfers: Sit to/from Stand, Bed to chair/wheelchair/BSC ?Sit to Stand: Min guard ?  ?  ?Step pivot transfers: Min guard ?  ?  ?General transfer comment: performed mobility and transfer with RW and min guard for safety ?  ?  ?Balance Overall balance assessment: Needs assistance ?Sitting-balance support: Bilateral upper extremity supported ?Sitting balance-Leahy Scale: Fair ?Sitting balance - Comments: seated up in recliner ?  ?Standing balance support: Bilateral upper extremity supported ?Standing balance-Leahy Scale: Poor ?Standing balance comment: reliant on RW for balance, able to pull up pants while standing with one extremity on walker ?  ?  ?  ?  ?  ?  ?  ?  ?  ?  ?  ?   ? ?ADL either performed or assessed with clinical judgement  ? ?ADL Overall ADL's : Needs assistance/impaired ?  ?  ?  ?  ?  ?  ?  ?  ?  ?  ?Lower Body Dressing: Supervision/safety;Sit to/from stand ?Lower Body Dressing Details (indicate cue type and reason): donned paper scrub bottoms with supervision ?Toilet Transfer: Min guard;Ambulation ?Toilet Transfer Details (indicate cue type and reason): simulated with recliner transfers ?  ?  ?  ?  ?  ?General ADL Comments: good mobility with RW and no LOB ?  ? ?Extremity/Trunk Assessment   ?  ?  ?  ?  ?  ? ?Vision   ?  ?  ?Perception   ?  ?Praxis   ?  ? ?Cognition Arousal/Alertness: Awake/alert ?Behavior During Therapy: [facility] for tasks assessed/performed ?Overall Cognitive Status: No family/caregiver present to determine baseline cognitive functioning ?Area of Impairment: Attention ?  ?  ?  ?  ?  ?  ?  ?  ?  ?  ?  ?  ?  ?  ?  ?  General Comments: moments of impulsiveness when attempting to stand before RW can be placed ?  ?  ?   ?Exercises   ? ?  ?Shoulder Instructions   ? ? ?  ?General Comments BP 149/104 seated 159/98 standing  ? ? ?Pertinent Vitals/ Pain       Pain Assessment ?Pain Assessment: Faces ?Faces Pain Scale: Hurts even more ?Pain Location: right leg pain ?Pain  Descriptors / Indicators: Grimacing, Guarding ?Pain Intervention(s): Limited activity within patient's tolerance, Monitored during session, Repositioned ? ?Home Living   ?  ?  ?  ?  ?  ?  ?  ?  ?  ?  ?  ?  ?  ?  ?  ?  ?  ?  ? ?  ?Prior Functioning/Environment    ?  ?  ?  ?   ? ?Frequency ? Min 2X/week  ? ? ? ? ?  ?Progress Toward Goals ? ?OT Goals(current goals can now be found in the care plan section) ? Progress towards OT goals: Progressing toward goals ? ?Acute Rehab OT Goals ?Patient Stated Goal: go to rehab facility ?OT Goal Formulation: With patient ?Time For Goal Achievement: 02/14/22 ?Potential to Achieve Goals: Fair ?ADL Goals ?Pt Will Perform Grooming: with supervision;standing;sitting ?Pt Will Perform Lower Body Bathing: with supervision;sit to/from stand ?Pt Will Perform Lower Body Dressing: with supervision;sit to/from stand ?Pt Will Transfer to Toilet: with set-up;regular height toilet;stand pivot transfer  ?Plan Discharge plan remains appropriate   ? ?Co-evaluation ? ? ?   ?  ?  ?  ?  ? ?  ?AM-PAC OT "6 Clicks" Daily Activity     ?Outcome Measure ? ? Help from another person eating meals?: None ?Help from another person taking care of personal grooming?: None ?Help from another person toileting, which includes using toliet, bedpan, or urinal?: A Little ?Help from another person bathing (including washing, rinsing, drying)?: A Little ?Help from another person to put on and taking off regular upper body clothing?: A Little ?Help from another person to put on and taking off regular lower body clothing?: A Little ?6 Click Score: 20 ? ?  ?End of Session Equipment Utilized During Treatment: Rolling walker (2 wheels) ? ?OT Visit Diagnosis: Unsteadiness on feet (R26.81);Muscle weakness (generalized) (M62.81);Pain ?Pain - part of body: Leg;Ankle and joints of foot ?  ?Activity Tolerance Patient tolerated treatment well ?  ?Patient Left in chair;with call bell/phone within reach ?  ?Nurse Communication  Mobility status ?  ? ?   ? ?Time: 8466-5993 ?OT Time Calculation (min): 22 min ? ?Charges: OT General Charges ?$OT Visit: 1 Visit ?OT Treatments ?$Self Care/Home Management : 8-22 mins ? ?Alfonse Flavors, OTA ?Acute Rehabilitation Services  ?Pager [phone] ?Office [phone] ? ? ?Dewain Penning ?02/04/2022, 3:15 PM ?

## 2022-02-04 NOTE — TOC Progression Note (Addendum)
Transition of Care (TOC) - Progression Note  ? ? ?Patient Details  ?Name: [name] ?MRN: [id number] ?Date of Birth: 1966/10/06 ? ?Transition of Care (TOC) CM/SW Contact  ?[name] Latin, [name] ?Phone Number: ?02/04/2022, 4:42 PM ? ?Clinical Narrative:    ?CSW received request to speak with Antonietta Breach. CSW contacted her [phone]). She requested patient be placed in a facility like [place], where he was previously sent. CSW explained that [place] is declining patient. Currently there are no bed offers. CSW expanded bed search. Per Gavin Pound, she is a social work Tax inspector at [employer] and she will speak with the patient to encourage him to be compliant with care. Case discussed in QC meeting with leadership and suggested to send referral to [facility]. CSW faxed request.  ? ?CSW also faxed referral to [facility] and [facility]. ? ? ? ?Expected Discharge Plan: Skilled Nursing Facility ?Barriers to Discharge: Continued Medical Work up, English as a second language teacher, Active Substance Use - Placement ? ?Expected Discharge Plan and Services ?Expected Discharge Plan: Skilled Nursing Facility ?In-house Referral: Clinical Social Work ?  ?  ?Living arrangements for the past 2 months: Homeless ?                ?  ?  ?  ?  ?  ?  ?  ?  ?  ?  ? ? ?Social Determinants of Health (SDOH) Interventions ?  ? ?Readmission Risk Interventions ?Readmission Risk Prevention Plan 02/04/2022 12/05/2021 03/05/2021  ?Transportation Screening Complete Complete Complete  ?PCP or Specialist Appt within 3-5 Days - - -  ?HRI or Home Care Consult - - -  ?Social Work Consult for Recovery Care Planning/Counseling - - -  ?Palliative Care Screening - - -  ?Medication Review Oceanographer) Complete Not Complete Complete  ?PCP or Specialist appointment within 3-5 days of discharge Complete Not Complete Complete  ?HRI or Home Care Consult Complete Not Complete Complete  ?SW Recovery Care/Counseling Consult Complete Not Complete  Complete  ?Palliative Care Screening Not Applicable Not Applicable Not Applicable  ?Skilled Nursing Facility Complete Not Applicable Complete  ?Some recent data might be hidden  ? ? ?

## 2022-02-04 NOTE — Progress Notes (Signed)
Pt states he would like a provider to explain why he is taking Keppra.  He states he has had no seizures since he was little.  He states that he doesn't want to seem like he is unwilling to take his medicine, but he just doesn't understand.   ? ?Food provided for patient overnight.  Staff offered to raise the [facility] several time, but the patient refused.   ? ?
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
## 2022-02-04 NOTE — Progress Notes (Signed)
Pt refused 0400 vitals.  Bridgett Larsson, DO was notified.   ? ? ?

## 2022-02-04 NOTE — Progress Notes (Signed)
NEUROLOGY CONSULTATION PROGRESS NOTE  ? ?Date of service: February 04, 2022 ?Patient Name: [name] ?MRN:  [id number] ?DOB:  December 18, 1965 ? ?Brief HPI  ?[name] is a 56 year old male with a past medical history of previous CVA with right-sided hemiplegia (uses a wheelchair at baseline), HFrEF (last EF less than 20%), coronary artery disease, well-controlled type 2 diabetes, cocaine use disorder, and schizoaffective disorder.  He presented to the emergency department with dysarthria on 3/1.  We have been consulted for new onset severe headache. ?  ?Interval Hx  ? ?NAEON. Patient reports a significant improvement in his headache, but still complains of 7/10 pain. He refuses to take Depakote, but is willing to try Topamax and is amenable to a one time dose of Solu-Medrol.  ? ?Vitals  ? ?Vitals:  ? 02/03/22 1957 02/03/22 2359 02/04/22 [id number] 02/04/22 0809  ?BP: 126/79 120/75  132/86  ?Pulse: 78 88 72 98  ?Resp: 17 18  20   ?Temp: 98.1 ?F (36.7 ?C) 98.1 ?F (36.7 ?C)  97.8 ?F (36.6 ?C)  ?TempSrc: Oral Oral  Oral  ?SpO2: 99% 97% 98% 94%  ?Weight:      ?  ? ?Body mass index is 29.82 kg/m?. ? ?Physical Exam  ? ?General: Lying comfortably in bed; in no acute distress.  ?HENT: Normal oropharynx and mucosa. Normal external appearance of ears and nose.  ?Neck: Supple ?CV: No peripheral edema.  ?Pulmonary: Symmetric Chest rise. Normal respiratory effort.  ?Abdomen: Soft to touch, non-tender.  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 ?Musculoskeletal: Normal digits and nails by inspection. No clubbing.  ? ?Neurologic Examination  ?Mental status/Cognition: Alert, oriented to self, place, month and year, good attention.  ?Speech/language: Fluent, comprehension intact ?Cranial nerves:  ? CN II Pupils equal and reactive to light, no VF deficits   ? CN III,IV,VI EOM intact, no gaze preference or deviation, no nystagmus   ? CN V untested  ? CN VII no asymmetry, no nasolabial fold  flattening   ? CN VIII normal hearing to speech   ? CN IX & X untested  ? CN XI untested  ? CN XII untested  ? ?Motor:  ?Mvmt Root Nerve  Muscle Right Left Comments  ?SA C5/6 Ax Deltoid 3/5 5/5   ?EF C5/6 Mc Biceps 3/5 5/5   ?EE C6/7/8 Rad Triceps 3/5 5/5   ?WF C6/7 Med FCR     ?WE C7/8 PIN ECU     ?F Ab C8/T1 U ADM/FDI     ?HF L1/2/3 Fem Illopsoas 3/5 5/5   ?KE L2/3/4 Fem Quad 3/5 5/5   ?DF L4/5 D Peron Tib Ant     ?PF S1/2 Tibial Grc/Sol     ? ?Reflexes: ? Right Left Comments  ?Pectoralis     ? Biceps (C5/6)     ?Brachioradialis (C5/6)     ? Triceps (C6/7)     ? Patellar (L3/4)     ? Achilles (S1)     ? Hoffman     ? Plantar     ?Jaw jerk   ? ?Sensation: ? Light touch intact  ? Pin prick   ? Temperature   ? Vibration   ?Proprioception   ? ?Coordination/Complex Motor:  ?- Gait: deferred ? ?Labs  ? ?Basic Metabolic Panel:  ?Lab Results  ?Component Value Date  ? NA 136 02/04/2022  ? K 4.3 02/04/2022  ? CO2 21 (L) 02/04/2022  ? GLUCOSE 98 02/04/2022  ?  BUN 21 (H) 02/04/2022  ? CREATININE 1.15 02/04/2022  ? CALCIUM 8.5 (L) 02/04/2022  ? GFRNONAA >60 02/04/2022  ? GFRAA >60 08/21/2020  ? ?HbA1c:  ?Lab Results  ?Component Value Date  ? HGBA1C 5.8 (H) 11/13/2021  ? ?LDL:  ?Lab Results  ?Component Value Date  ? [place] 72 12/18/2020  ? ?Urine Drug Screen:  ?   ?Component Value Date/Time  ? LABOPIA NONE DETECTED 01/29/2022 1028  ? COCAINSCRNUR POSITIVE (A) 01/29/2022 1028  ? COCAINSCRNUR POSITIVE (A) 12/25/2016 1533  ? LABBENZ NONE DETECTED 01/29/2022 1028  ? AMPHETMU NONE DETECTED 01/29/2022 1028  ? THCU NONE DETECTED 01/29/2022 1028  ? LABBARB NONE DETECTED 01/29/2022 1028  ?  ?Alcohol Level  ?   ?Component Value Date/Time  ? ETH 10 (H) 01/29/2022 1025  ? ?No results found for: PHENYTOIN, ZONISAMIDE, LAMOTRIGINE, LEVETIRACETA ?Lab Results  ?Component Value Date  ? VALPROATE <10 (L) 02/09/2021  ? ? ?Imaging and Diagnostic studies  ?CT Head without contrast: ?IMPRESSION: ?1. No acute intracranial pathology. ?2. Unchanged  remote infarcts in the bilateral cerebellar ?hemispheres. ?  ?MRI Brain: ?IMPRESSION: ?1. No acute intracranial abnormality. ?2. Moderate chronic small vessel ischemic disease. ?3. Chronic cerebellar infarcts. ? ?CT-Venogram: ?Negative CT venogram.  No evidence for dural sinus thrombosis. ? ?CTA Head and Neck: ?1. Negative CTA for large vessel occlusion. No intracranial ?aneurysm. ?2. Bulky calcified plaque about the right carotid bulb with ?associated severe short-segment stenosis of up to 80% by NASCET ?criteria. ? ?rEEG: NA ? ? ?Impression  ?[name] is a 56 year old male with a past medical history of previous CVA with right-sided hemiplegia (uses a wheelchair at baseline), HFrEF (last EF less than 20%), coronary artery disease, well-controlled type 2 diabetes, cocaine use disorder, and schizoaffective disorder.  He presented to the emergency department with dysarthria on 3/1.  We have been consulted for new onset severe headache. ?  ?The patient's headache could be a migraine variant with some contribution from his elevated blood pressure (measured at 164/112 while we were in the room). Tension headache does not seem likely given the severity and cluster headache does not seem likely given lack of localization and lack of previous Hx. Dural venous sinus thrombosis is possible.  The positional symptoms of this headache are the opposite of what is seen in idiopathic intracranial hypertension.  Giant cell arteritis is also unlikely given the lack of jaw claudication or severe visual symptoms. CTA and CTV have ruled out stroke and dural venous sinus thrombosis respectively. Patient with residual pain today after migraine cocktail yesterday. ? ?Recommendations  ? ?-IV Solumedrol 500 mg x1 for headache ?-Start Topamax 25 mg QHS x1 week for headache ? -25 mg BID x1 week ? -25 mg qAM / 50 mg QHS x1 wk ? -50 mg BID thereafter ?-Control of HTN per primary ?-OP neurology follow up ? ?Corky Sox,  MD ?PGY-1 ? ?Attending addendum ?Patient seen and examined with resident physician. ?Continues to have mild dysarthria as well as stuttering speech. ?MRI imaging was negative for acute infarction ?CTA head and neck is negative for emergent LVO but there is bulky calcified plaque about the right carotid bulb with associated severe short segment stenosis of up to 80%.  There is severe ostial stenosis of the origin of the left vertebral artery.  Mild atheromatous change elsewhere in the major arterial vasculature of the head and neck.  No other proximal high-grade correctable stenosis.  Negative CT venogram of the head for dural venous sinus thrombosis. ?  Reports mild improvement in headache after the cocktail yesterday. ?Unwilling to try Depakote as he thinks that it is a psych medication and he has tried that before and he does not like the way the medication makes him feel. ?I am not really sure if he truly has migraines but we can try IV Solu-Medrol 1 time for breaking the cycle of pain and Topamax for prevention. ?I have discussed one-time dose of IV Solu-Medrol 500 mg which she is agreeable to. ?For headache prevention, we will start him on Topamax-25 mg nightly for 1 week followed by 25 mg twice daily for 1 week, followed by 25 mg every morning and 50 mg nightly for 1 week followed by 50 mg twice daily for a total dose of 100 mg daily. ?He should see headache specialist-preferably Dr. Delfin Edis at [facility] neurology in 4 to 6 weeks. ?He has been counseled on cessation for cocaine use which he verbalizes understanding of. ?Management of underlying psychiatric conditions per primary team and psychiatry. ?Plan was discussed with Dr. Waldron Labs. ?  ?-- ?Amie Portland, MD ?Neurologist ?[facility] ?Pager: [phone] ?

## 2022-02-04 NOTE — Progress Notes (Signed)
Refused 4AM vitals  ?

## 2022-02-04 NOTE — Progress Notes (Signed)
Physical Therapy Treatment ?Patient Details ?Name: [name] ?MRN: [id number] ?DOB: 1966-08-13 ?Today's Date: 02/04/2022 ? ? ?History of Present Illness 56 y.o. male admitted 3/1 with slurred speech and HTN emergency. MRI head (-). PMHx: asthma, bipolar 1 disorder, depression, chronic combined systolic and diastolic CHF, COPD, GERD, homelessness, HTN, schizoaffective disorder, ETOH and cocaine abuse, CAD ? ?  ?PT Comments  ? ? Pt admitted with above diagnosis. Pt was only able to sit EOB for a few min as he had headache and dizziness with BP 170/117.  MD and nurse made aware.  Will continue to progress pt as able.  Pt currently with functional limitations due to balance and endurance deficits. Pt will benefit from skilled PT to increase their independence and safety with mobility to allow discharge to the venue listed below.      ?Recommendations for follow up therapy are one component of a multi-disciplinary discharge planning process, led by the attending physician.  Recommendations may be updated based on patient status, additional functional criteria and insurance authorization. ? ?Follow Up Recommendations ? Skilled nursing-short term rehab (<3 hours/day) ?  ?  ?Assistance Recommended at Discharge Frequent or constant Supervision/Assistance  ?Patient can return home with the following A lot of help with walking and/or transfers;A lot of help with bathing/dressing/bathroom;Assistance with cooking/housework;Assist for transportation;Direct supervision/assist for medications management ?  ?Equipment Recommendations ? Other (comment) (TBD)  ?  ?Recommendations for Other Services   ? ? ?  ?Precautions / Restrictions Precautions ?Precautions: Fall ?Precaution Comments: bil feet and lower leg wounds ?Restrictions ?Weight Bearing Restrictions: No ?Other Position/Activity Restrictions: tends to hold R leg up to avoid putting weight on it  ?  ? ?Mobility ? Bed Mobility ?Overal bed mobility: Modified Independent ?  ?  ?   ?  ?  ?  ?General bed mobility comments: pt able to transition from sidelying to sitting  with increased time and effort. Pt reporting dizziness and headache once in sitting with BP 170/107. Pt declined further activitydue to headache. ?  ? ?Transfers ?  ?  ?  ?  ?  ?  ?  ?  ?  ?  ?  ? ?Ambulation/Gait ?  ?  ?  ?  ?  ?  ?  ?  ? ? ?Stairs ?  ?  ?  ?  ?  ? ? ?Wheelchair Mobility ?  ? ?Modified Rankin (Stroke Patients Only) ?  ? ? ?  ?Balance Overall balance assessment: Needs assistance ?Sitting-balance support: Bilateral upper extremity supported ?Sitting balance-Leahy Scale: Fair ?Sitting balance - Comments: reliant on bil UE support in sitting ?  ?  ?  ?  ?  ?  ?  ?  ?  ?  ?  ?  ?  ?  ?  ?  ? ?  ?Cognition Arousal/Alertness: Awake/alert ?Behavior During Therapy: [facility] for tasks assessed/performed ?Overall Cognitive Status: No family/caregiver present to determine baseline cognitive functioning ?Area of Impairment: Attention ?  ?  ?  ?  ?  ?  ?  ?  ?  ?  ?  ?  ?  ?  ?  ?General Comments: Patient apperas to be at his baseline for cognition.  following commands, makes needs known, with decreased safety noted. ?  ?  ? ?  ?Exercises General Exercises - Lower Extremity ?Ankle Circles/Pumps: AROM, Both, 10 reps, Supine ?Long Arc Quad: AROM, Both, 10 reps, Seated ?Hip Flexion/Marching: AROM, Both, 10 reps, Seated ? ?  ?General Comments   ?  ?  ? ?  Pertinent Vitals/Pain Pain Assessment ?Pain Assessment: Faces ?Faces Pain Scale: Hurts whole lot ?Breathing: normal ?Negative Vocalization: none ?Facial Expression: smiling or inexpressive ?Body Language: relaxed ?Consolability: no need to console ?PAINAD Score: 0 ?Pain Location: pt reports pain to R leg greater than L leg, headache ?Pain Descriptors / Indicators: Grimacing, Guarding ?Pain Intervention(s): Limited activity within patient's tolerance, Monitored during session, Repositioned  ? ? ?Home Living   ?  ?  ?  ?  ?  ?  ?  ?  ?  ?   ?  ?Prior Function    ?  ?  ?   ? ?PT Goals  (current goals can now be found in the care plan section) Acute Rehab PT Goals ?Patient Stated Goal: be able to move better ?Progress towards PT goals: Not progressing toward goals - comment (limited by headahce and dizziness) ? ?  ?Frequency ? ? ? Min 2X/week ? ? ? ?  ?PT Plan Current plan remains appropriate  ? ? ?Co-evaluation   ?  ?  ?  ?  ? ?  ?AM-PAC PT "6 Clicks" Mobility   ?Outcome Measure ? Help needed turning from your back to your side while in a flat bed without using bedrails?: A Little ?Help needed moving from lying on your back to sitting on the side of a flat bed without using bedrails?: A Little ?Help needed moving to and from a bed to a chair (including a wheelchair)?: A Lot ?Help needed standing up from a chair using your arms (e.g., wheelchair or bedside chair)?: Total ?Help needed to walk in hospital room?: Total ?Help needed climbing 3-5 steps with a railing? : Total ?6 Click Score: 11 ? ?  ?End of Session   ?Activity Tolerance: Patient limited by pain (headache and dizziness) ?Patient left: in bed;with call bell/phone within reach;with bed alarm set ?Nurse Communication: Mobility status ?PT Visit Diagnosis: Other abnormalities of gait and mobility (R26.89);Difficulty in walking, not elsewhere classified (R26.2);Muscle weakness (generalized) (M62.81) ?  ? ? ?Time: 0942-1000 ?PT Time Calculation (min) (ACUTE ONLY): 18 min ? ?Charges:  $Therapeutic Activity: 8-22 mins          ?          ? ?Adyn Serna M,PT ?Acute Rehab Services ?[phone] ?[phone] (pager)  ? ? ?Bevelyn Buckles ?02/04/2022, 10:12 AM ? ?

## 2022-02-04 NOTE — Progress Notes (Deleted)
Patient's CBG 51.  I amp D50 given.  Recheck CBG 178.  Will continue to monitor ?

## 2022-02-05 LAB — CBC WITH DIFFERENTIAL/PLATELET
Abs Immature Granulocytes: 0.01 10*3/uL (ref 0.00–0.07)
Basophils Absolute: 0 10*3/uL (ref 0.0–0.1)
Basophils Relative: 0 %
Eosinophils Absolute: 0 10*3/uL (ref 0.0–0.5)
Eosinophils Relative: 0 %
HCT: 43.6 % (ref 39.0–52.0)
Hemoglobin: 15 g/dL (ref 13.0–17.0)
Immature Granulocytes: 0 %
Lymphocytes Relative: 14 %
Lymphs Abs: 1 10*3/uL (ref 0.7–4.0)
MCH: 32.4 pg (ref 26.0–34.0)
MCHC: 34.4 g/dL (ref 30.0–36.0)
MCV: 94.2 fL (ref 80.0–100.0)
Monocytes Absolute: 0.1 10*3/uL (ref 0.1–1.0)
Monocytes Relative: 1 %
Neutro Abs: 6 10*3/uL (ref 1.7–7.7)
Neutrophils Relative %: 85 %
Platelets: 351 10*3/uL (ref 150–400)
RBC: 4.63 MIL/uL (ref 4.22–5.81)
RDW: 14.1 % (ref 11.5–15.5)
WBC: 7 10*3/uL (ref 4.0–10.5)
nRBC: 0 % (ref 0.0–0.2)

## 2022-02-05 LAB — COMPREHENSIVE METABOLIC PANEL
ALT: 19 U/L (ref 0–44)
AST: 14 U/L — ABNORMAL LOW (ref 15–41)
Albumin: 3.3 g/dL — ABNORMAL LOW (ref 3.5–5.0)
Alkaline Phosphatase: 64 U/L (ref 38–126)
Anion gap: 9 (ref 5–15)
BUN: 21 mg/dL — ABNORMAL HIGH (ref 6–20)
CO2: 23 mmol/L (ref 22–32)
Calcium: 8.9 mg/dL (ref 8.9–10.3)
Chloride: 100 mmol/L (ref 98–111)
Creatinine, Ser: 1.03 mg/dL (ref 0.61–1.24)
GFR, Estimated: >60 mL/min (ref >60)
Glucose, Bld: 159 mg/dL — ABNORMAL HIGH (ref 70–99)
Potassium: 3.9 mmol/L (ref 3.5–5.1)
Sodium: 132 mmol/L — ABNORMAL LOW (ref 135–145)
Total Bilirubin: 0.6 mg/dL (ref 0.3–1.2)
Total Protein: 7 g/dL (ref 6.5–8.1)

## 2022-02-05 LAB — BRAIN NATRIURETIC PEPTIDE: B Natriuretic Peptide: 487.5 pg/mL — ABNORMAL HIGH (ref 0.0–100.0)

## 2022-02-05 LAB — GLUCOSE, CAPILLARY: Glucose-Capillary: 211 mg/dL — ABNORMAL HIGH (ref 70–99)

## 2022-02-05 LAB — MAGNESIUM: Magnesium: 2.1 mg/dL (ref 1.7–2.4)

## 2022-02-05 NOTE — Plan of Care (Signed)
Mr. Woodin blood pressures are better controlled. He's on oral lasix. Would continue his current regimen.Please see final recommendations from Dr Charlott Rakes note.  ?

## 2022-02-05 NOTE — Progress Notes (Signed)
Patient very agitatied this a.m.  Request MD to bedside. States he has been here a week and no one has done anything. Patient non compliant  with medication regimen as he refused his a.m. medication for this RN. MD notified. Patient will be discharged today per MD. ? ? ?Patient refused to sign AMA paperwork . Manager aware. MD/SW made aware. ?

## 2022-02-05 NOTE — Discharge Summary (Signed)
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 regurgitation is trivial. Aorta: The aortic root and ascending aorta are structurally normal, with no evidence of dilitation. Pulmonary Artery: The pulmonary artery is mildly dilated. Venous: The inferior vena cava is normal in size with less than 50% respiratory variability, suggesting right atrial pressure of 8 mmHg. IAS/Shunts: No atrial level shunt detected by color flow Doppler.  LEFT VENTRICLE PLAX 2D LVIDd:         6.00 cm      Diastology LVIDs:         5.50 cm      LV e' medial:    5.36 cm/s LV PW:         1.30 cm      LV E/e' medial:  20.1 LV IVS:        1.20 cm      LV e' lateral:   4.53 cm/s LVOT diam:     2.10 cm      LV E/e' lateral: 23.8 LV SV:         35 LV SV Index:   17 LVOT Area:     3.46 cm  LV Volumes (MOD) LV vol d, MOD A4C: 144.0 ml LV vol s, MOD A4C: 99.1 ml LV SV MOD A4C:     144.0 ml RIGHT VENTRICLE RV Basal diam:  5.20 cm RV Mid diam:    2.90 cm RV S prime:     9.07 cm/s TAPSE (M-mode): 1.5 cm LEFT ATRIUM           Index        RIGHT ATRIUM           Index LA diam:      5.10 cm 2.48 cm/m   RA Area:     25.10 cm LA Vol (A4C): 83.0 ml 40.31 ml/m  RA Volume:   87.30 ml  42.40 ml/m  AORTIC VALVE                    PULMONIC VALVE AV Area  (Vmax):    2.43 cm     PV Vmax:          0.73 m/s AV Area (Vmean):   2.12 cm     PV Vmean:         49.000 cm/s AV Area (VTI):     2.17 cm     PV VTI:           0.111 m AV Vmax:           95.90 cm/s   PV Peak grad:     2.1 mmHg AV Vmean:          67.000 cm/s  PV Mean grad:     1.0 mmHg AV VTI:            0.159 m      PR End Diast Vel: 13.25 msec AV Peak Grad:      3.7 mmHg AV Mean Grad:      2.0 mmHg LVOT Vmax:         67.20 cm/s LVOT Vmean:        41.100 cm/s LVOT VTI:          0.100 m LVOT/AV VTI ratio: 0.63  AORTA Ao Root diam: 2.90 cm Ao Asc diam:  3.20 cm MITRAL VALVE                TRICUSPID VALVE MV Area (PHT): 6.43 cm     TR Peak grad:   39.7 mmHg MV Decel Time: 118 msec     TR Vmax:        315.00 cm/s MV E velocity: 108.00 cm/s MV A velocity: 39.80 cm/s   SHUNTS MV E/A ratio:  2.71         Systemic VTI:  0.10 m                             Systemic Diam: 2.10 cm Lyman Bishop MD Electronically signed by Lyman Bishop MD Signature Date/Time: 01/30/2022/11:15:44 AM    Final    CT VENOGRAM HEAD  Result Date: 02/04/2022 CLINICAL DATA:  Initial evaluation for acute headache. EXAM: CT ANGIOGRAPHY HEAD AND NECK CT VENOGRAM HEAD TECHNIQUE: Multidetector CT imaging of the head and neck was performed using the standard protocol during bolus administration of intravenous contrast. Multiplanar CT image reconstructions and MIPs were obtained to evaluate the vascular anatomy. Carotid stenosis measurements (when applicable) are obtained utilizing NASCET criteria, using the distal internal carotid diameter as the denominator. RADIATION DOSE REDUCTION: This exam was performed according to the departmental  dose-optimization program which includes automated exposure control, adjustment of the mA and/or kV according to patient size and/or use of iterative reconstruction technique. CONTRAST:  70mL OMNIPAQUE IOHEXOL 350 MG/ML SOLN COMPARISON:  MRI FROM 01/29/2022. FINDINGS: CT HEAD FINDINGS Brain: Cerebral volume within  normal limits. 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 No acute intracranial hemorrhage. No acute large vessel territory infarct. No mass lesion or midline shift. No hydrocephalus or extra-axial fluid collection. Vascular: No hyperdense vessel. Scattered vascular calcifications noted within the carotid siphons. Skull: Scalp soft tissues and calvarium within normal limits. Sinuses: Clear. 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 Otherwise unremarkable. Review of the MIP images confirms the above findings CTA NECK FINDINGS Aortic arch: Visualized aortic arch normal in caliber with normal branch pattern. Mild atheromatous change about the arch and origin of the great vessels without significant stenosis. Right carotid system: Right CCA patent from its origin to the bifurcation without stenosis. Bulky calcified plaque about the right carotid bulb with associated severe short-segment stenosis of up to 80% by NASCET criteria. Right ICA patent distally without stenosis or dissection. Left carotid system: Left CCA patent from its origin to the bifurcation without stenosis. Eccentric calcified plaque about the left carotid bulb/proximal left ICA without hemodynamically significant stenosis. Left ICA patent distally without stenosis or dissection. Vertebral arteries: Both vertebral arteries arise from the subclavian arteries. No significant proximal subclavian artery stenosis. Severe ostial stenosis noted at the origin of the left vertebral artery (series 11, image 279). Vertebral arteries otherwise patent without stenosis or dissection. Skeleton: No discrete or worrisome osseous lesions. Poor dentition noted. Other neck: No other acute soft tissue abnormality within the neck. Upper chest: Emphysematous changes noted within the visualized lungs. Visualized upper chest demonstrates no other acute finding. Review of the MIP  images confirms the above findings CTA HEAD FINDINGS Anterior circulation: Petrous segments patent bilaterally. Mild atheromatous change seen throughout the carotid siphons without hemodynamically significant stenosis. A1 segments patent bilaterally. Right A1 hypoplastic. Normal anterior communicating artery complex. Anterior cerebral arteries patent to their distal aspects without stenosis. No M1 stenosis or occlusion. Normal MCA bifurcations. Distal MCA branches perfused and symmetric. Posterior circulation: Both V4 segments patent without stenosis. Right vertebral artery dominant. Both PICA patent at their origins. Basilar patent to its distal aspect without stenosis. Superior cerebellar arteries patent bilaterally. Both PCAs primarily supplied via the basilar. Mild atheromatous irregularity within the left greater than right PCAs without hemodynamically significant stenosis. Venous sinuses: Normal enhancement seen throughout the superior sagittal sinus to the torcula. Transverse and sigmoid sinuses are patent as are the visualized internal jugular veins. Straight sinus, vein of Galen, internal cerebral veins, and basal veins of Rosenthal appear patent. No appreciable abnormality about the cavernous sinus. No dural venous sinus thrombosis. Anatomic variants: As above. No aneurysm or other vascular malformation. Review of the MIP images confirms the above findings IMPRESSION: CT HEAD: 1. No acute intracranial abnormality. 2. 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 CTA HEAD AND NECK: 1. Negative CTA for large vessel occlusion. No intracranial aneurysm. 2. Bulky calcified plaque about the right carotid bulb with associated severe short-segment stenosis of up to 80% by NASCET criteria. 3. Severe ostial stenosis at the origin of the left vertebral artery. Otherwise wide patency of the vertebrobasilar system. 4. Mild atheromatous change elsewhere about the major  arterial vasculature of the head and neck as above. No other proximal high-grade or  correctable stenosis. 5. Emphysema (ICD10-J43.9). CT VENOGRAM: Negative CT venogram.  No evidence for dural sinus thrombosis. Electronically Signed   By: Jeannine Boga M.D.   On: 02/04/2022 00:35         Discharge Exam: Vitals:   02/05/22 0347 02/05/22 0823  BP: 126/70 (!) 156/80  Pulse: 76 100  Resp: 18 19  Temp: 97.8 F (36.6 C) (!) 97.4 F (36.3 C)  SpO2: 93% 98%   Vitals:   02/05/22 0300 02/05/22 0347 02/05/22 0416 02/05/22 0823  BP:  126/70  (!) 156/80  Pulse: 75 76  100  Resp:  18  19  Temp: 97.8 F (36.6 C) 97.8 F (36.6 C)  (!) 97.4 F (36.3 C)  TempSrc: Oral Oral  Oral  SpO2:  93%  98%  Weight:   87 kg       The results of significant diagnostics from this hospitalization (including imaging, microbiology, ancillary and laboratory) are listed below for reference.     Microbiology: Recent Results (from the past 240 hour(s))  Urine Culture     Status: Abnormal   Collection Time: 01/29/22  9:38 AM   Specimen: In/Out Cath Urine  Result Value Ref Range Status   Specimen Description IN/OUT CATH URINE  Final   Special Requests   Final    NONE Performed at [facility], [address] [place], [place] [address]    Culture >=100,000 COLONIES/mL ENTEROBACTER CLOACAE (A)  Final   Report Status 01/31/2022 FINAL  Final   Organism ID, Bacteria ENTEROBACTER CLOACAE (A)  Final      Susceptibility   Enterobacter cloacae - MIC*    CEFAZOLIN >=64 RESISTANT Resistant     CEFEPIME <=0.12 SENSITIVE Sensitive     CIPROFLOXACIN <=0.25 SENSITIVE Sensitive     GENTAMICIN <=1 SENSITIVE Sensitive     IMIPENEM <=0.25 SENSITIVE Sensitive     NITROFURANTOIN 32 SENSITIVE Sensitive     TRIMETH/SULFA <=20 SENSITIVE Sensitive     PIP/TAZO <=4 SENSITIVE Sensitive     * >=100,000 COLONIES/mL ENTEROBACTER CLOACAE  [name] Culture (routine x 2)     Status: None   Collection Time: 01/29/22   9:43 AM   Specimen: [name]  Result Value Ref Range Status   Specimen Description [name] LEFT ANTECUBITAL  Final   Special Requests   Final    BOTTLES DRAWN AEROBIC AND ANAEROBIC [name] Culture adequate volume   Culture   Final    NO GROWTH 5 DAYS Performed at [facility], [place] [address] [place], [place] [address]    Report Status 02/03/2022 FINAL  Final  Resp Panel by RT-PCR (Flu A&B, Covid) Nasopharyngeal Swab     Status: None   Collection Time: 01/29/22  9:50 AM   Specimen: Nasopharyngeal Swab; Nasopharyngeal(NP) swabs in vial transport medium  Result Value Ref Range Status   SARS Coronavirus 2 by RT PCR NEGATIVE NEGATIVE Final    Comment: (NOTE) SARS-CoV-2 target nucleic acids are NOT DETECTED.  The SARS-CoV-2 RNA is generally detectable in upper respiratory specimens during the acute phase of infection. The lowest concentration of SARS-CoV-2 viral copies this assay can detect is 138 copies/mL. A negative result does not preclude SARS-Cov-2 infection and should not be used as the sole basis for treatment or other patient management decisions. A negative result may occur with  improper specimen collection/handling, submission of specimen other than nasopharyngeal swab, presence of viral mutation(s) within the areas targeted by this assay, and inadequate number of viral  copies(<138 copies/mL). A negative result must be combined with clinical observations, patient history, and epidemiological information. The expected result is Negative.  Fact Sheet for Patients:  EntrepreneurPulse.com.au  Fact Sheet for Healthcare Providers:  IncredibleEmployment.be  This test is no t yet approved or cleared by the [place] FDA and  has been authorized for detection and/or diagnosis of SARS-CoV-2 by FDA under an Emergency Use Authorization (EUA). This EUA will remain  in effect (meaning this test can be used) for the duration of the COVID-19  declaration under Section 564(b)(1) of the Act, 21 U.S.C.section 360bbb-3(b)(1), unless the authorization is terminated  or revoked sooner.       Influenza A by PCR NEGATIVE NEGATIVE Final   Influenza B by PCR NEGATIVE NEGATIVE Final    Comment: (NOTE) The Xpert Xpress SARS-CoV-2/FLU/RSV plus assay is intended as an aid in the diagnosis of influenza from Nasopharyngeal swab specimens and should not be used as a sole basis for treatment. Nasal washings and aspirates are unacceptable for Xpert Xpress SARS-CoV-2/FLU/RSV testing.  Fact Sheet for Patients: EntrepreneurPulse.com.au  Fact Sheet for Healthcare Providers: IncredibleEmployment.be  This test is not yet approved or cleared by the [place] FDA and has been authorized for detection and/or diagnosis of SARS-CoV-2 by FDA under an Emergency Use Authorization (EUA). This EUA will remain in effect (meaning this test can be used) for the duration of the COVID-19 declaration under Section 564(b)(1) of the Act, 21 U.S.C. section 360bbb-3(b)(1), unless the authorization is terminated or revoked.  Performed at [facility], [place] [address], [place], [place] [address]   [name] Culture (routine x 2)     Status: None   Collection Time: 01/29/22 10:55 AM   Specimen: [name]  Result Value Ref Range Status   Specimen Description [name] RIGHT ANTECUBITAL  Final   Special Requests   Final    BOTTLES DRAWN AEROBIC AND ANAEROBIC [name] Culture adequate volume   Culture   Final    NO GROWTH 5 DAYS Performed at [facility], [place] [address], [place], [place] [address]    Report Status 02/03/2022 FINAL  Final     Labs: BNP (last 3 results) Recent Labs    02/03/22 1017 02/04/22 0729 02/05/22 0503  BNP 776.3* 624.0* [id number]*   Basic Metabolic Panel: Recent Labs  Lab 02/01/22 0303 02/02/22 0108 02/03/22 1017 02/04/22 0227 02/05/22 0503  NA 137 138 138 136 132*  K 4.5 4.0 3.8 4.3 3.9   CL 104 102 104 102 100  CO2 24 25 25  21* 23  GLUCOSE 115* 110* 113* 98 159*  BUN 22* 28* 25* 21* 21*  CREATININE 1.19 1.51* 1.33* 1.15 1.03  CALCIUM 8.6* 8.5* 8.2* 8.5* 8.9  MG 2.1 1.9 2.1 2.1 2.1   Liver Function Tests: Recent Labs  Lab 02/02/22 0108 02/03/22 1017 02/04/22 0227 02/05/22 0503  AST 14* 15 25 14*  ALT 19 16 12 19   ALKPHOS 70 61 61 64  BILITOT 0.3 0.7 2.1* 0.6  PROT 6.1* 5.8* 6.1* 7.0  ALBUMIN 3.0* 2.9* 3.0* 3.3*   No results for input(s): LIPASE, AMYLASE in the last 168 hours. No results for input(s): AMMONIA in the last 168 hours. CBC: Recent Labs  Lab 02/01/22 0303 02/02/22 0108 02/03/22 1017 02/04/22 0227 02/05/22 0503  WBC 12.4* 11.4* 8.7 7.6 7.0  NEUTROABS 10.9* 9.5* 5.3 4.1 6.0  HGB 12.4* 12.9* 14.0 15.1 15.0  HCT 36.8* 39.5 41.7 45.3 43.6  MCV 97.6 99.0 98.6 96.8 94.2  PLT 302 339 291 317 351   Cardiac Enzymes: No results for input(s): CKTOTAL, CKMB, CKMBINDEX, TROPONINI in the last 168 hours. BNP: Invalid input(s): POCBNP CBG: Recent Labs  Lab 02/04/22 0808 02/04/22 1206 02/04/22 1605 02/04/22 2013 02/05/22 0822  GLUCAP 140* 134* 115* 142* 211*   D-Dimer No results for input(s): DDIMER in the last 72 hours. Hgb A1c No results for input(s): HGBA1C in the last 72 hours. Lipid Profile No results for input(s): CHOL, HDL, LDLCALC, TRIG, CHOLHDL, LDLDIRECT in the last 72 hours. Thyroid function studies No results for input(s): TSH, T4TOTAL, T3FREE, THYROIDAB in the last 72 hours.  Invalid input(s): FREET3 Anemia work up No results for input(s): VITAMINB12, FOLATE, FERRITIN, TIBC, IRON, RETICCTPCT in the last 72 hours. Urinalysis    Component Value Date/Time   COLORURINE YELLOW 01/29/2022 1028   APPEARANCEUR HAZY (A) 01/29/2022 1028   LABSPEC 1.018 01/29/2022 1028   PHURINE 5.0 01/29/2022 1028   GLUCOSEU 50 (A) 01/29/2022 1028   HGBUR SMALL (A) 01/29/2022 1028   BILIRUBINUR NEGATIVE 01/29/2022 1028   KETONESUR 20 (A)  01/29/2022 1028   PROTEINUR 100 (A) 01/29/2022 1028   NITRITE NEGATIVE 01/29/2022 1028   LEUKOCYTESUR NEGATIVE 01/29/2022 1028   Sepsis Labs Invalid input(s): PROCALCITONIN,  WBC,  LACTICIDVEN Microbiology Recent Results (from the past 240 hour(s))  Urine Culture     Status: Abnormal   Collection Time: 01/29/22  9:38 AM   Specimen: In/Out Cath Urine  Result Value Ref Range Status   Specimen Description IN/OUT CATH URINE  Final   Special Requests   Final    NONE Performed at [facility], [place] [address] [place], [place] [address]    Culture >=100,000 COLONIES/mL ENTEROBACTER CLOACAE (A)  Final   Report Status 01/31/2022 FINAL  Final   Organism ID, Bacteria ENTEROBACTER CLOACAE (A)  Final      Susceptibility   Enterobacter cloacae - MIC*    CEFAZOLIN >=64 RESISTANT Resistant     CEFEPIME <=0.12 SENSITIVE Sensitive     CIPROFLOXACIN <=0.25 SENSITIVE Sensitive     GENTAMICIN <=1 SENSITIVE Sensitive     IMIPENEM <=0.25 SENSITIVE Sensitive     NITROFURANTOIN 32 SENSITIVE Sensitive     TRIMETH/SULFA <=20 SENSITIVE Sensitive     PIP/TAZO <=4 SENSITIVE Sensitive     * >=100,000 COLONIES/mL ENTEROBACTER CLOACAE  [name] Culture (routine x 2)     Status: None   Collection Time: 01/29/22  9:43 AM   Specimen: [name]  Result Value Ref Range Status   Specimen Description [name] LEFT ANTECUBITAL  Final   Special Requests   Final    BOTTLES DRAWN AEROBIC AND ANAEROBIC [name] Culture adequate volume   Culture   Final    NO GROWTH 5 DAYS Performed at [facility], [place] [address] [place], [place] [address]    Report Status 02/03/2022 FINAL  Final  Resp Panel by RT-PCR (Flu A&B, Covid) Nasopharyngeal Swab     Status: None   Collection Time: 01/29/22  9:50 AM   Specimen: Nasopharyngeal Swab; Nasopharyngeal(NP) swabs in vial transport medium  Result Value Ref Range Status   SARS Coronavirus 2 by RT PCR NEGATIVE NEGATIVE Final    Comment: (NOTE) SARS-CoV-2 target nucleic acids are  NOT DETECTED.  The SARS-CoV-2 RNA is generally detectable in upper respiratory specimens during the acute phase of infection. The lowest concentration of SARS-CoV-2 viral copies this assay can detect is 138 copies/mL. A negative result does not preclude SARS-Cov-2 infection and  should not be used as the sole basis for treatment or other patient management decisions. A negative result may occur with  improper specimen collection/handling, submission of specimen other than nasopharyngeal swab, presence of viral mutation(s) within the areas targeted by this assay, and inadequate number of viral copies(<138 copies/mL). A negative result must be combined with clinical observations, patient history, and epidemiological information. The expected result is Negative.  Fact Sheet for Patients:  EntrepreneurPulse.com.au  Fact Sheet for Healthcare Providers:  IncredibleEmployment.be  This test is no t yet approved or cleared by the [place] FDA and  has been authorized for detection and/or diagnosis of SARS-CoV-2 by FDA under an Emergency Use Authorization (EUA). This EUA will remain  in effect (meaning this test can be used) for the duration of the COVID-19 declaration under Section 564(b)(1) of the Act, 21 U.S.C.section 360bbb-3(b)(1), unless the authorization is terminated  or revoked sooner.       Influenza A by PCR NEGATIVE NEGATIVE Final   Influenza B by PCR NEGATIVE NEGATIVE Final    Comment: (NOTE) The Xpert Xpress SARS-CoV-2/FLU/RSV plus assay is intended as an aid in the diagnosis of influenza from Nasopharyngeal swab specimens and should not be used as a sole basis for treatment. Nasal washings and aspirates are unacceptable for Xpert Xpress SARS-CoV-2/FLU/RSV testing.  Fact Sheet for Patients: EntrepreneurPulse.com.au  Fact Sheet for Healthcare Providers: IncredibleEmployment.be  This test is not  yet approved or cleared by the [place] FDA and has been authorized for detection and/or diagnosis of SARS-CoV-2 by FDA under an Emergency Use Authorization (EUA). This EUA will remain in effect (meaning this test can be used) for the duration of the COVID-19 declaration under Section 564(b)(1) of the Act, 21 U.S.C. section 360bbb-3(b)(1), unless the authorization is terminated or revoked.  Performed at [facility], [place] [address] [place], [place] [address]   [name] Culture (routine x 2)     Status: None   Collection Time: 01/29/22 10:55 AM   Specimen: [name]  Result Value Ref Range Status   Specimen Description [name] RIGHT ANTECUBITAL  Final   Special Requests   Final    BOTTLES DRAWN AEROBIC AND ANAEROBIC [name] Culture adequate volume   Culture   Final    NO GROWTH 5 DAYS Performed at [facility], [place] [address], [place], [place] [address]    Report Status 02/03/2022 FINAL  Final     Phillips Climes, MD  [facility] 02/05/2022, 3:48 PM Pager   If 7PM-7AM, please contact night-coverage [email or web address]

## 2022-02-06 ENCOUNTER — Telehealth: Payer: Self-pay

## 2022-02-06 NOTE — Telephone Encounter (Signed)
Spoke to patient's social worker Geologist, engineering.Post hospital appointment scheduled with Dr.Hochrein 3/28 at 3:20 pm. ?

## 2022-02-13 ENCOUNTER — Other Ambulatory Visit: Payer: Self-pay

## 2022-02-13 ENCOUNTER — Emergency Department (HOSPITAL_COMMUNITY)
Admission: EM | Admit: 2022-02-13 | Discharge: 2022-02-13 | Disposition: A | Discharge status: Home / Self Care | Payer: Medicaid Other | Attending: Emergency Medicine | Admitting: Emergency Medicine

## 2022-02-13 ENCOUNTER — Emergency Department (HOSPITAL_COMMUNITY): Payer: Medicaid Other

## 2022-02-13 ENCOUNTER — Encounter (HOSPITAL_COMMUNITY): Payer: Self-pay

## 2022-02-13 DIAGNOSIS — I509 Heart failure, unspecified: Secondary | ICD-10-CM | POA: Insufficient documentation

## 2022-02-13 DIAGNOSIS — M79661 Pain in right lower leg: Secondary | ICD-10-CM | POA: Diagnosis present

## 2022-02-13 DIAGNOSIS — G8929 Other chronic pain: Secondary | ICD-10-CM | POA: Diagnosis not present

## 2022-02-13 DIAGNOSIS — Z91010 Allergy to peanuts: Secondary | ICD-10-CM | POA: Diagnosis not present

## 2022-02-13 DIAGNOSIS — M79662 Pain in left lower leg: Secondary | ICD-10-CM | POA: Diagnosis not present

## 2022-02-13 DIAGNOSIS — J449 Chronic obstructive pulmonary disease, unspecified: Secondary | ICD-10-CM | POA: Insufficient documentation

## 2022-02-13 LAB — CBC WITH DIFFERENTIAL/PLATELET
Abs Immature Granulocytes: 0.05 10*3/uL (ref 0.00–0.07)
Basophils Absolute: 0 10*3/uL (ref 0.0–0.1)
Basophils Relative: 0 %
Eosinophils Absolute: 0 10*3/uL (ref 0.0–0.5)
Eosinophils Relative: 0 %
HCT: 44.2 % (ref 39.0–52.0)
Hemoglobin: 14.6 g/dL (ref 13.0–17.0)
Immature Granulocytes: 0 %
Lymphocytes Relative: 14 %
Lymphs Abs: 1.9 10*3/uL (ref 0.7–4.0)
MCH: 32.4 pg (ref 26.0–34.0)
MCHC: 33 g/dL (ref 30.0–36.0)
MCV: 98 fL (ref 80.0–100.0)
Monocytes Absolute: 0.7 10*3/uL (ref 0.1–1.0)
Monocytes Relative: 5 %
Neutro Abs: 10.5 10*3/uL — ABNORMAL HIGH (ref 1.7–7.7)
Neutrophils Relative %: 81 %
Platelets: 245 10*3/uL (ref 150–400)
RBC: 4.51 MIL/uL (ref 4.22–5.81)
RDW: 14.7 % (ref 11.5–15.5)
WBC: 13.1 10*3/uL — ABNORMAL HIGH (ref 4.0–10.5)
nRBC: 0 % (ref 0.0–0.2)

## 2022-02-13 LAB — COMPREHENSIVE METABOLIC PANEL
ALT: 19 U/L (ref 0–44)
AST: 26 U/L (ref 15–41)
Albumin: 3.5 g/dL (ref 3.5–5.0)
Alkaline Phosphatase: 73 U/L (ref 38–126)
Anion gap: 15 (ref 5–15)
BUN: 27 mg/dL — ABNORMAL HIGH (ref 6–20)
CO2: 18 mmol/L — ABNORMAL LOW (ref 22–32)
Calcium: 8.9 mg/dL (ref 8.9–10.3)
Chloride: 103 mmol/L (ref 98–111)
Creatinine, Ser: 1.53 mg/dL — ABNORMAL HIGH (ref 0.61–1.24)
GFR, Estimated: 54 mL/min — ABNORMAL LOW (ref >60)
Glucose, Bld: 90 mg/dL (ref 70–99)
Potassium: 4.6 mmol/L (ref 3.5–5.1)
Sodium: 136 mmol/L (ref 135–145)
Total Bilirubin: 1.5 mg/dL — ABNORMAL HIGH (ref 0.3–1.2)
Total Protein: 6.3 g/dL — ABNORMAL LOW (ref 6.5–8.1)

## 2022-02-13 LAB — CBG MONITORING, ED: Glucose-Capillary: 110 mg/dL — ABNORMAL HIGH (ref 70–99)

## 2022-02-13 LAB — CK: Total CK: 222 U/L (ref 49–397)

## 2022-02-13 IMAGING — DX DG ANKLE COMPLETE 3+V*L*
3 series · 3 of 3 positions shown · non-contrast
Comparison: None.

CLINICAL DATA: Pain.

EXAM:
LEFT ANKLE COMPLETE - 3+ VIEW

[ankle ap]
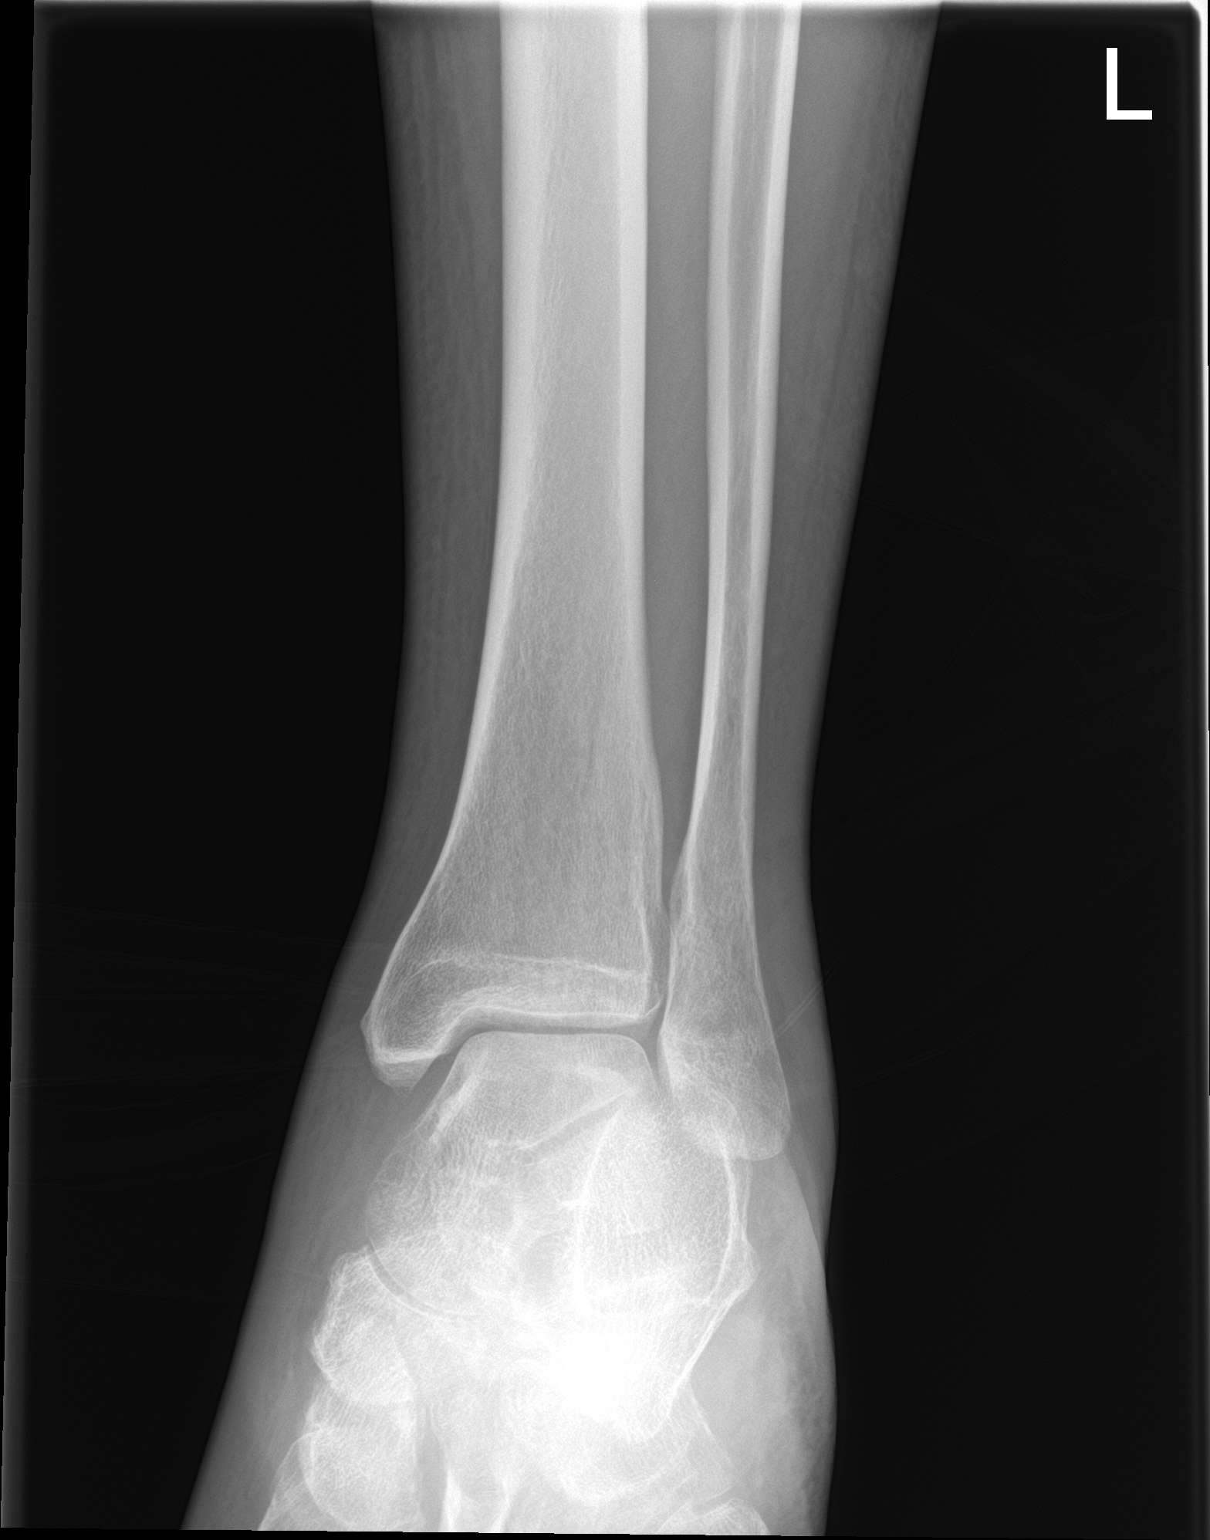

[ankle obl]
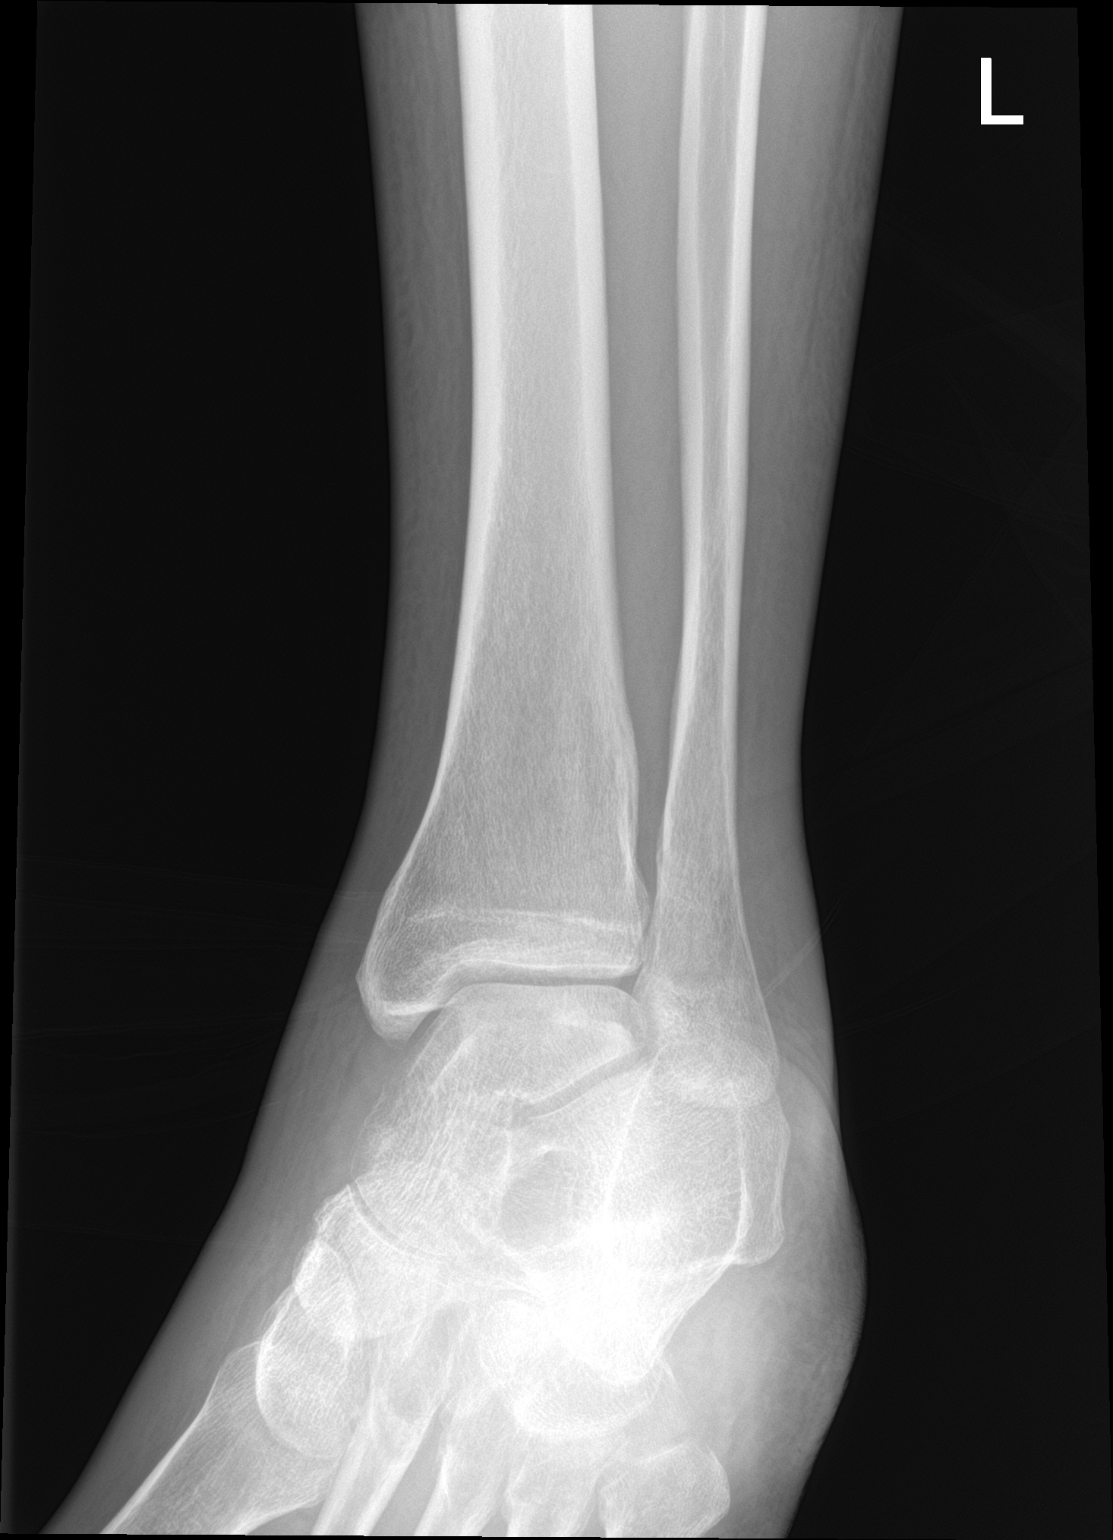

[ankle lat]
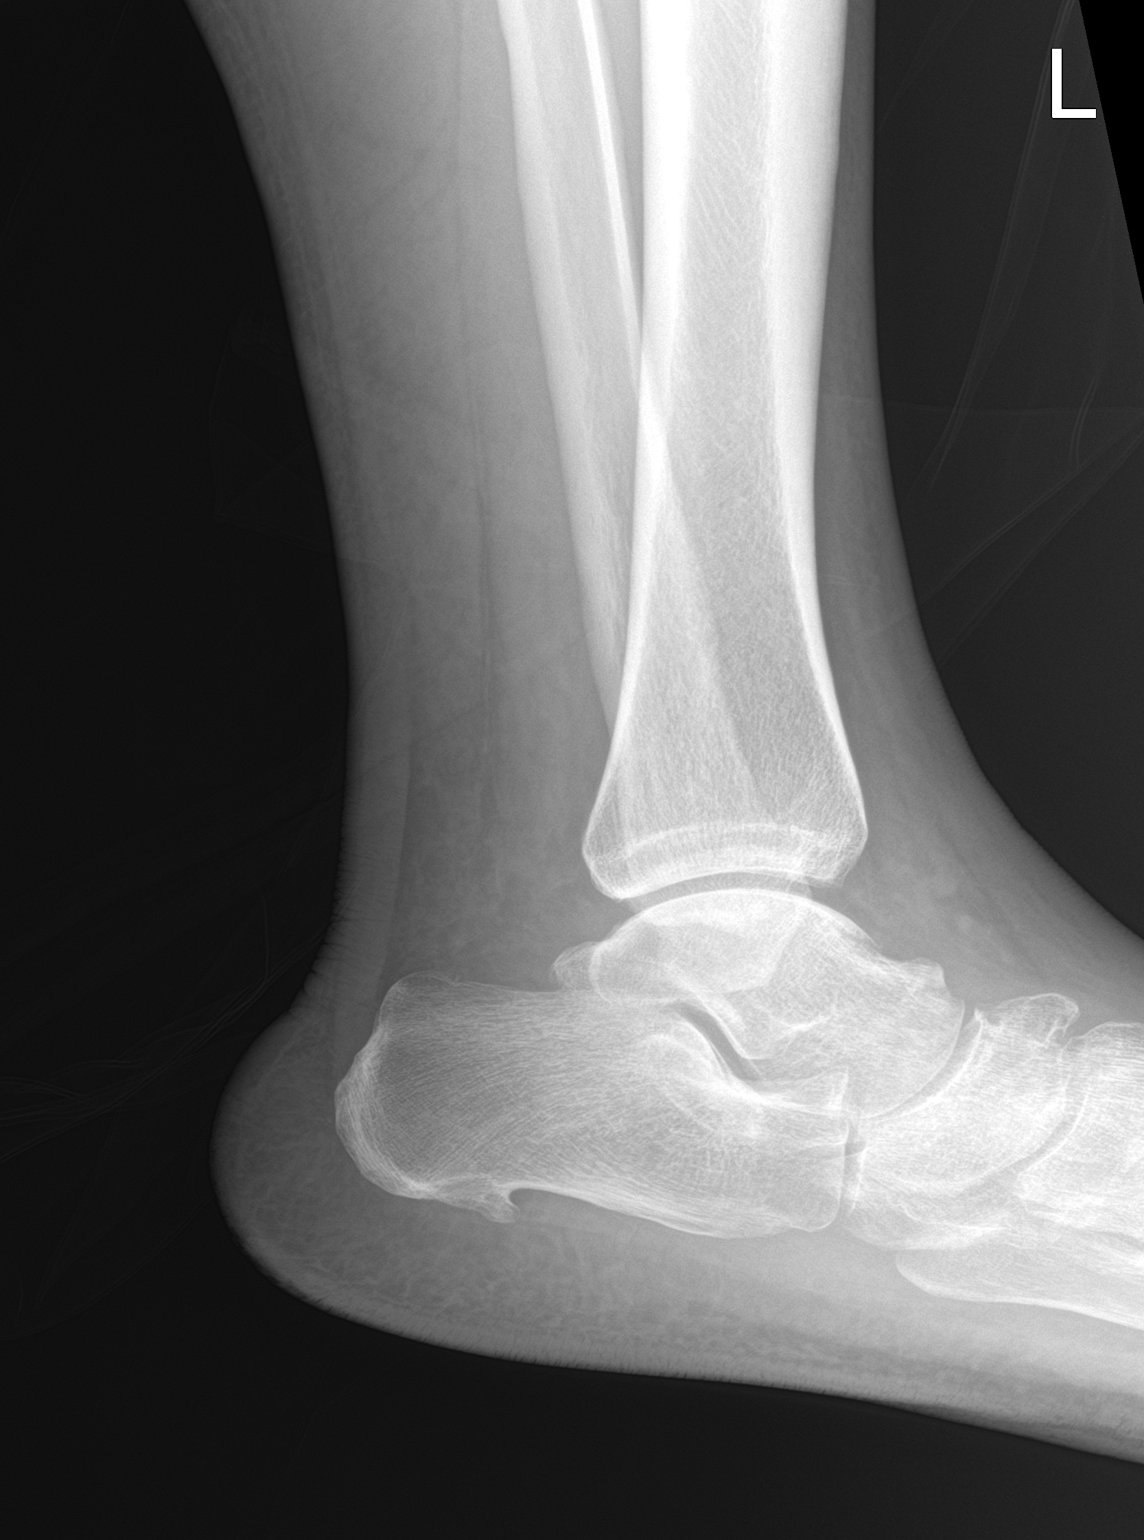

[3 of 3 positions shown; findings below may reference images not displayed]

FINDINGS: There is no evidence of fracture, dislocation, or joint effusion.
There is no evidence of arthropathy or other focal bone abnormality.
There is diffuse soft tissue swelling of the ankle.
IMPRESSION: 1. Diffuse soft tissue swelling of the ankle.
2. No acute bony abnormality.

## 2022-02-13 IMAGING — DX DG FOOT COMPLETE 3+V*R*
3 series · 3 of 3 positions shown · non-contrast
Comparison: X-ray [DATE]

CLINICAL DATA: bilateral leg pain

EXAM:
RIGHT FOOT COMPLETE - 3+ VIEW

[foot ap]
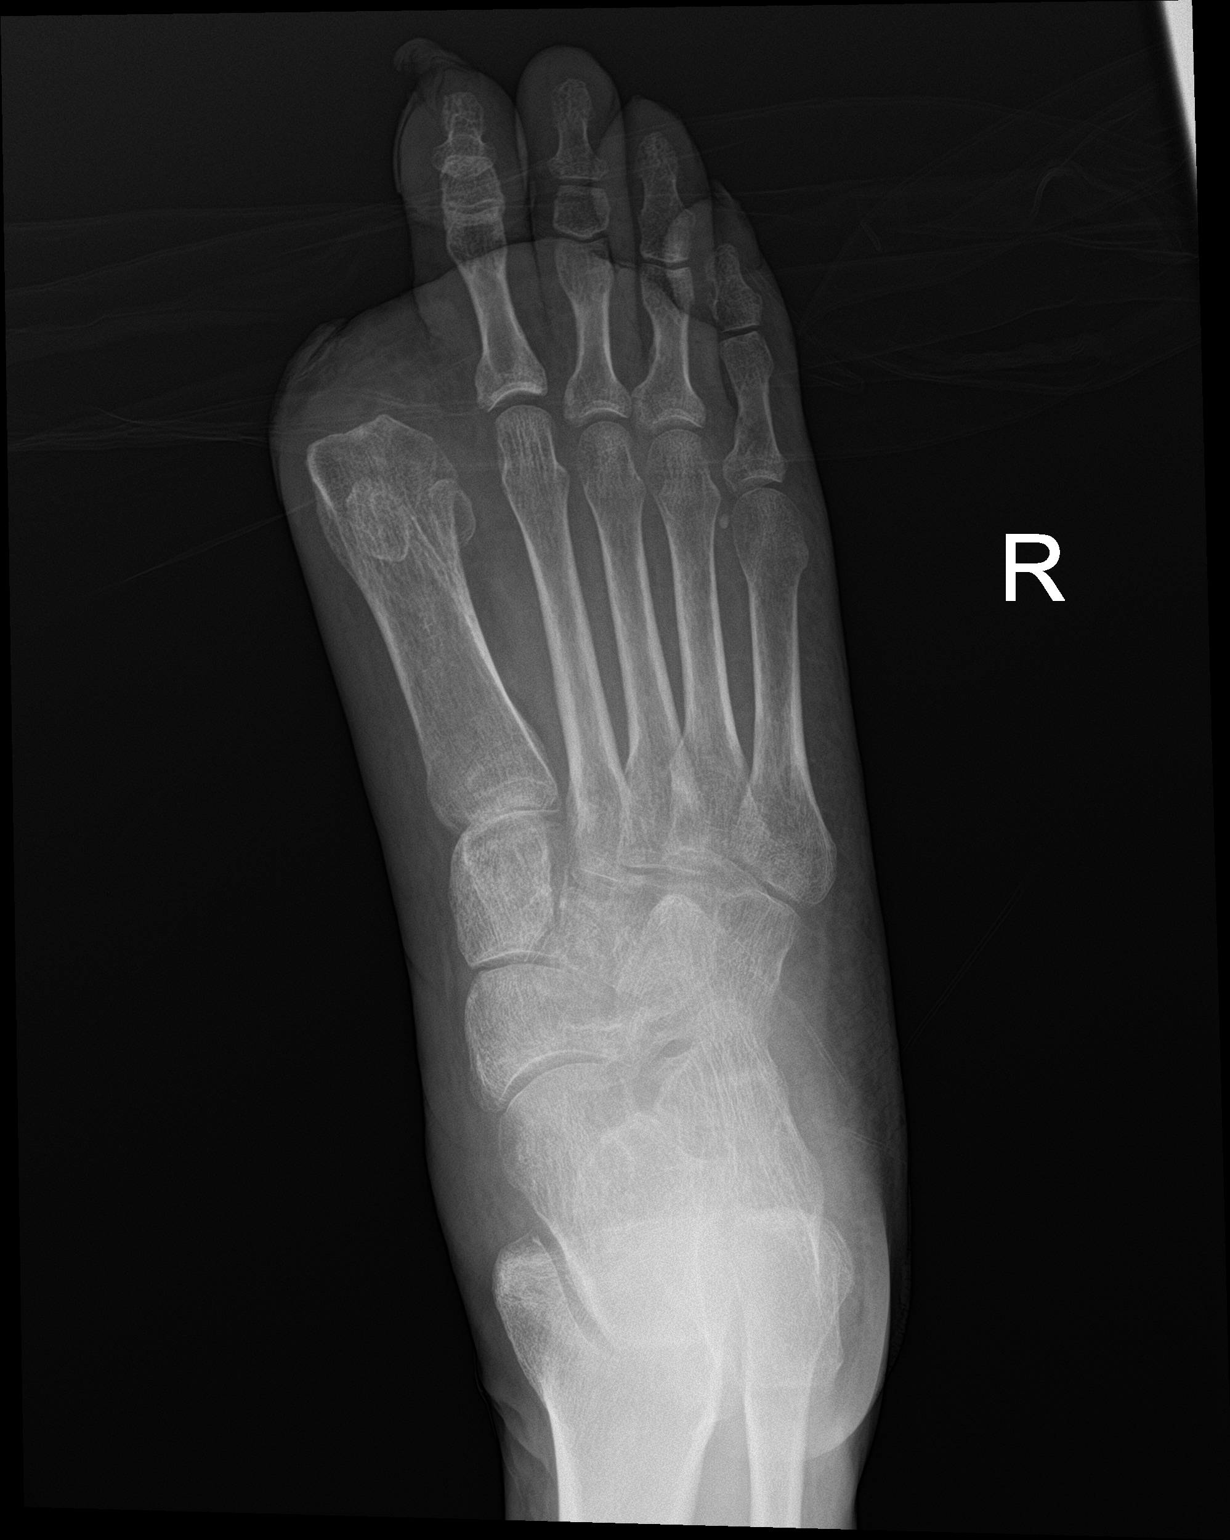

[foot obl]
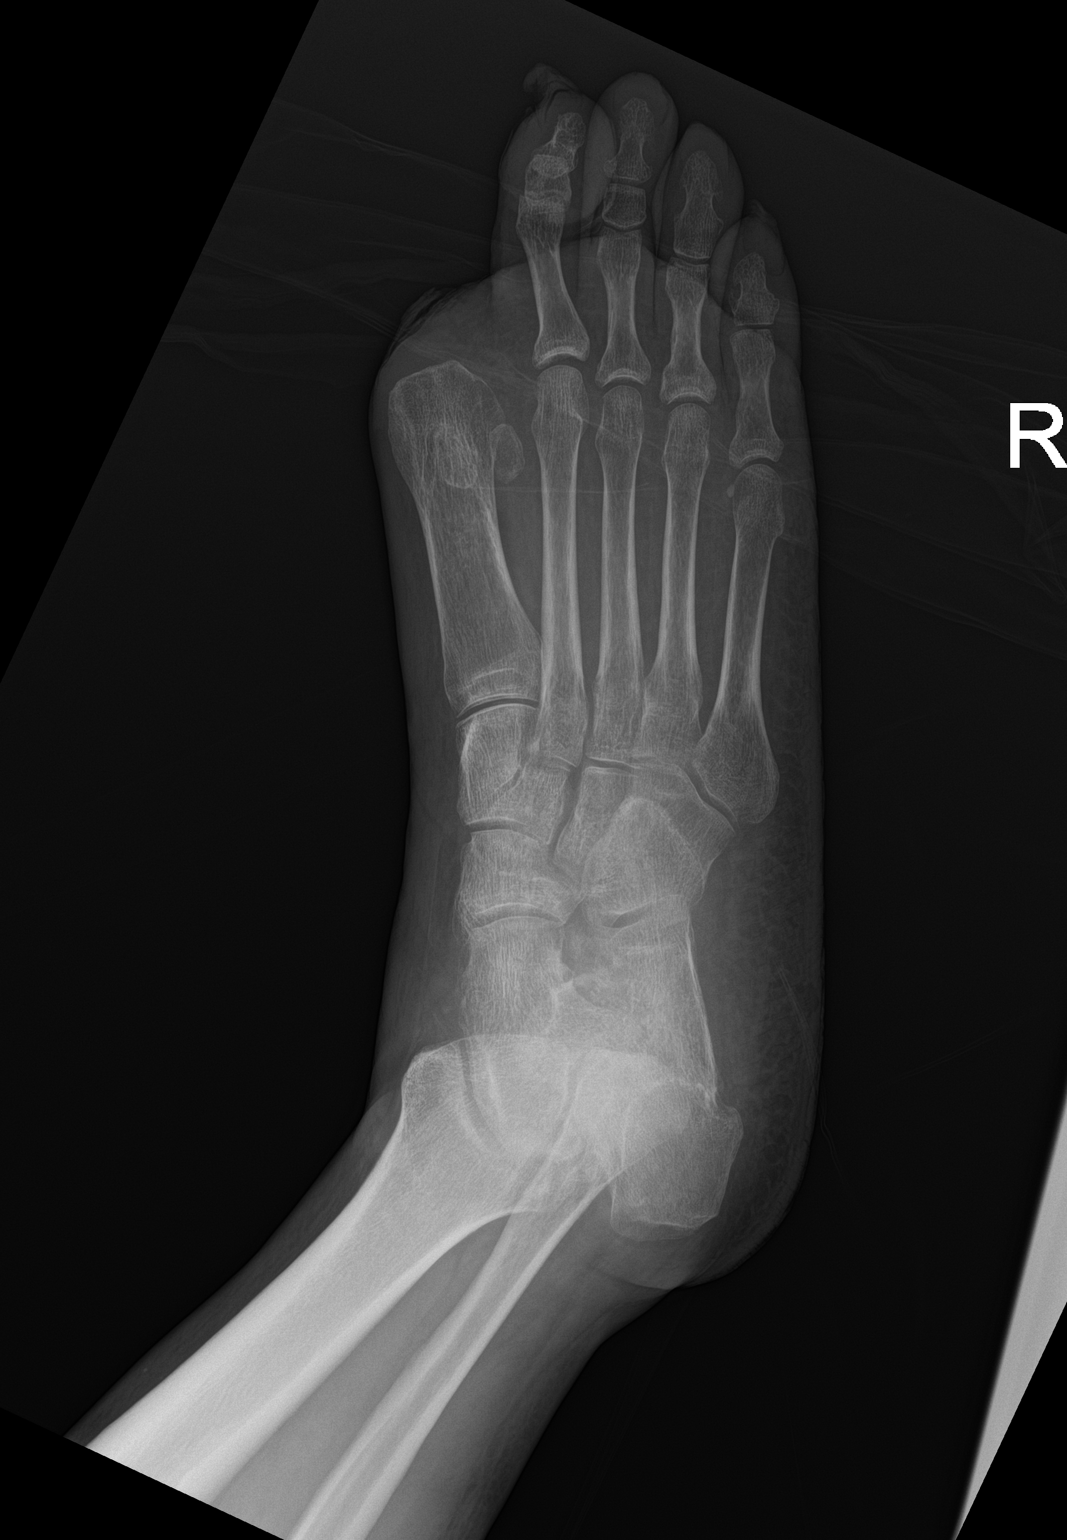

[foot lat]
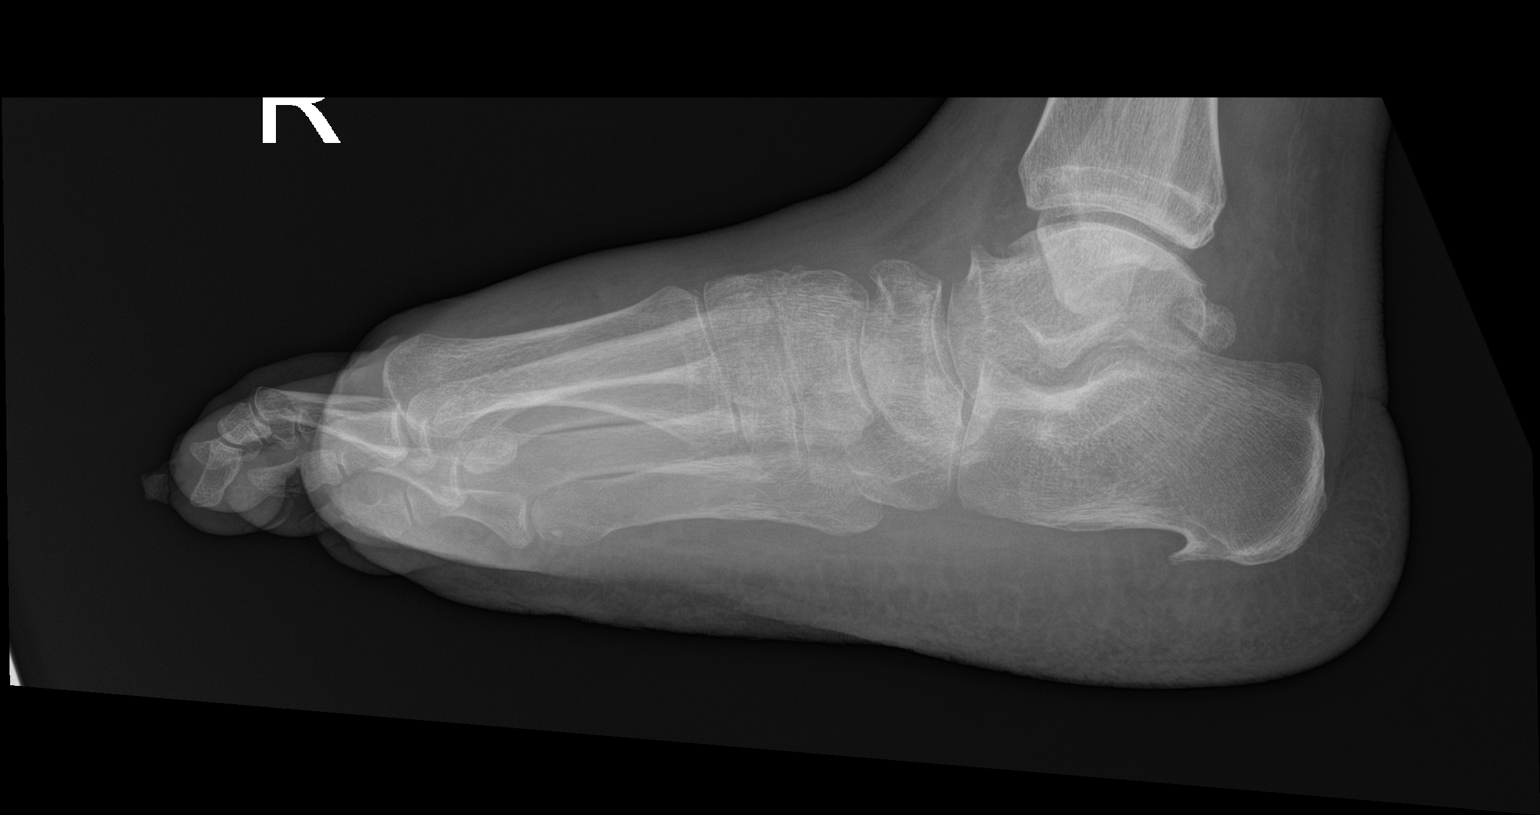

[3 of 3 positions shown; findings below may reference images not displayed]

FINDINGS: Amputation of the first ray at metatarsal phalangeal joint. No
osseous erosion in the RIGHT foot. No subcutaneous gas. There is
soft tissue swelling about the dorsum of the RIGHT foot.
Enthesopathic spurring along the plantar aspect of the calcaneus.
IMPRESSION: 1. No evidence of osteomyelitis.
2. Mild soft tissue swelling of forefoot.

## 2022-02-13 IMAGING — DX DG ANKLE COMPLETE 3+V*R*
3 series · 3 of 3 positions shown · non-contrast
Comparison: None.

CLINICAL DATA: Right ankle pain.

EXAM:
RIGHT ANKLE - COMPLETE 3+ VIEW

[ankle ap]
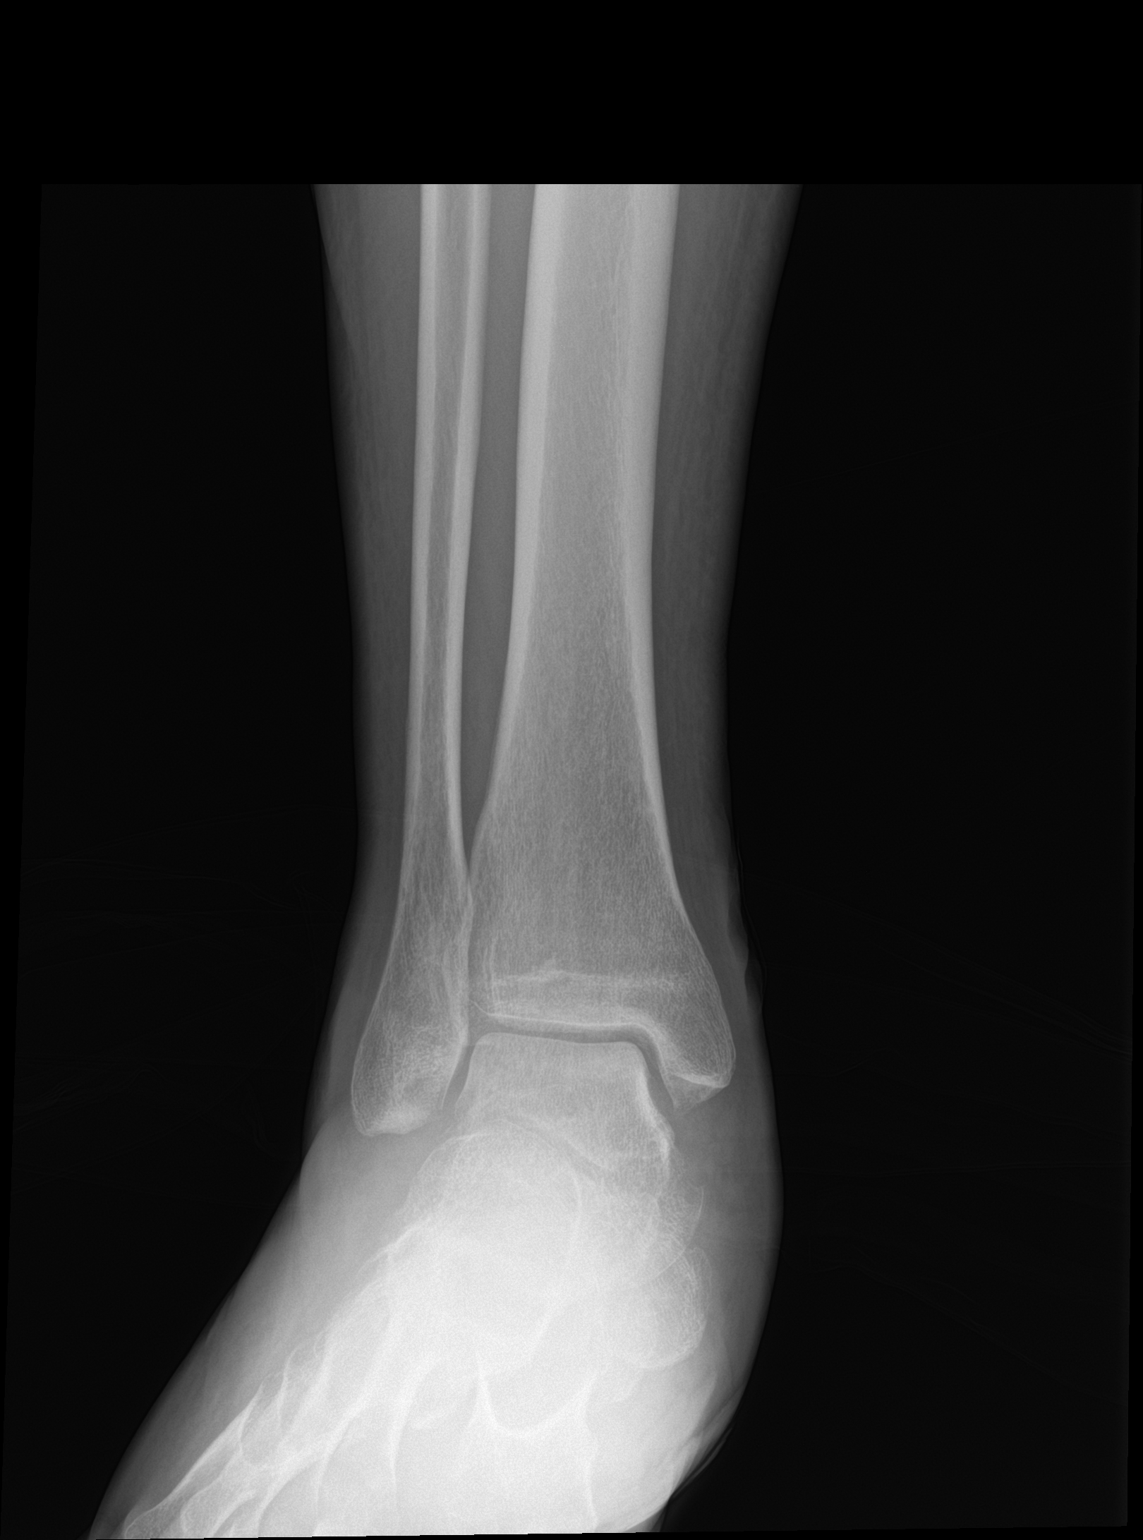

[ankle obl]
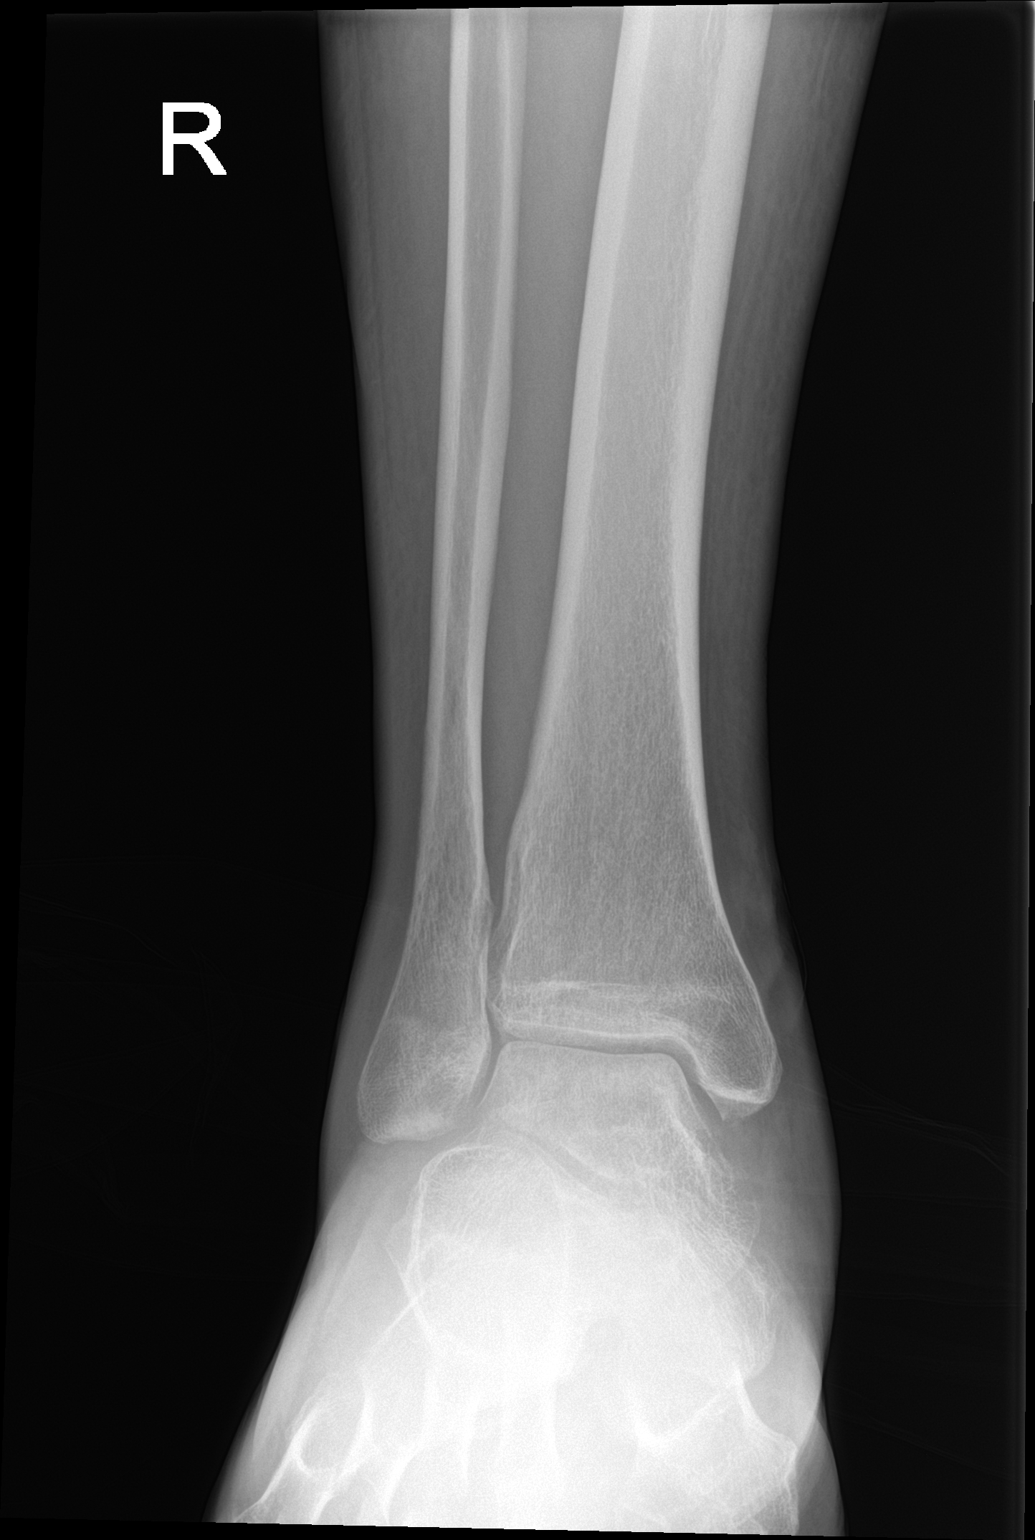

[ankle lat]
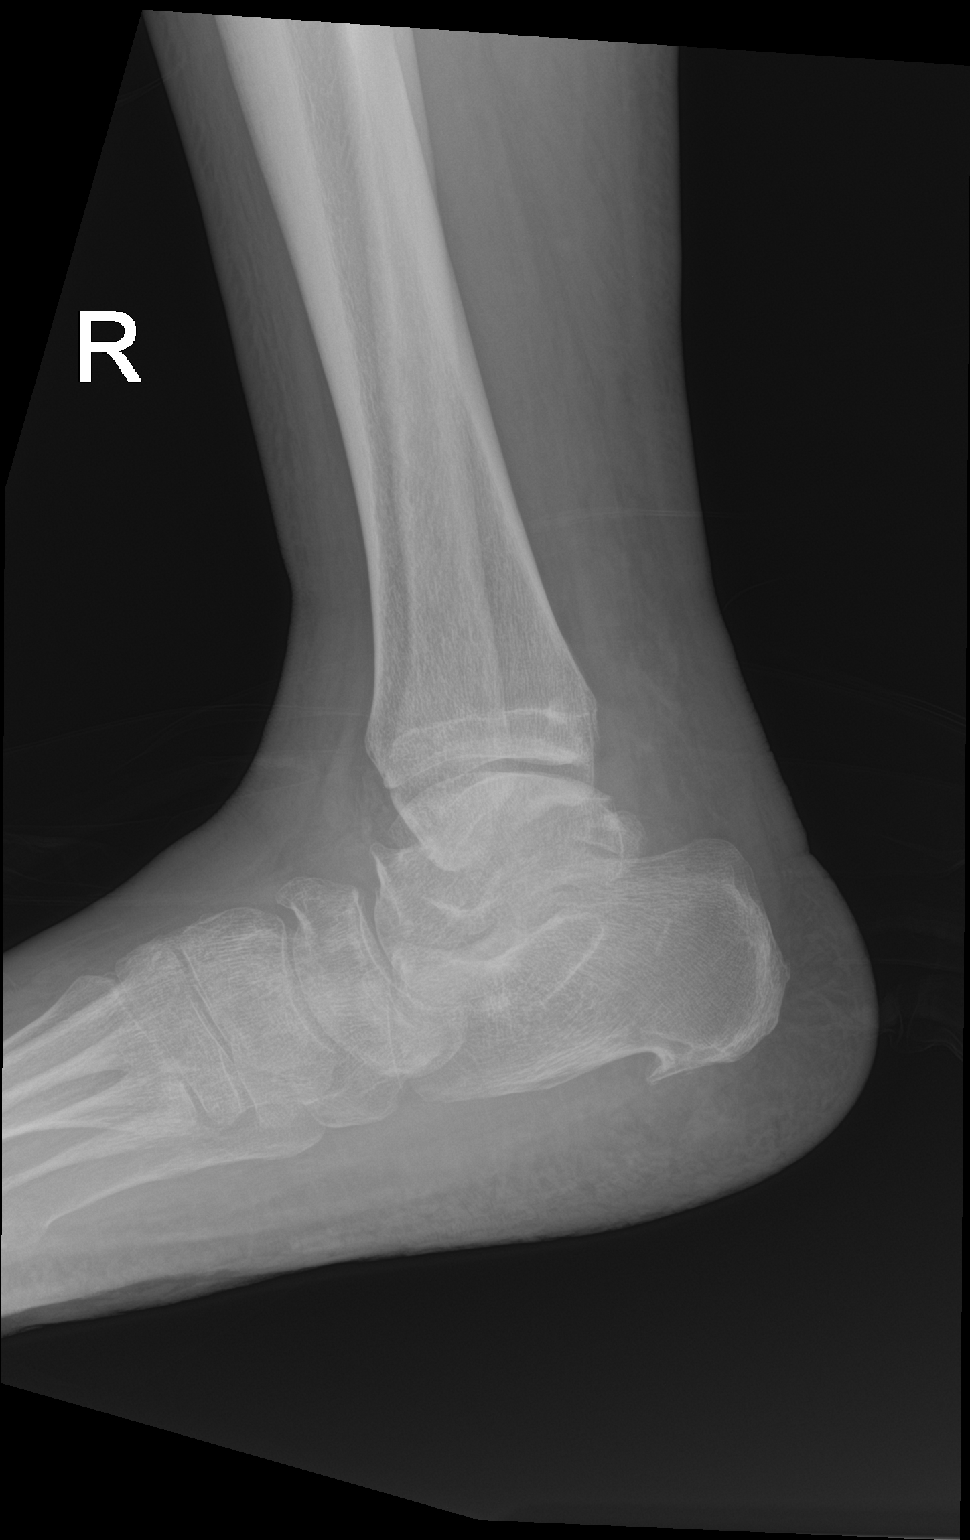

[3 of 3 positions shown; findings below may reference images not displayed]

FINDINGS: There is no evidence of an acute fracture, dislocation, or joint
effusion. There is a large plantar calcaneal spur. Degenerative
changes are seen along the dorsal aspect of the proximal right
foot. Small, superficial soft tissue defects are seen along medial
aspect of the right ankle.
IMPRESSION: 1. Degenerative changes without evidence of acute osseous
abnormality.
2. Large plantar calcaneal spur.

## 2022-02-13 IMAGING — DX DG FOOT COMPLETE 3+V*L*
3 series · 3 of 3 positions shown · non-contrast
Comparison: None.

CLINICAL DATA: Pain.

EXAM:
LEFT FOOT - COMPLETE 3+ VIEW

[foot ap]
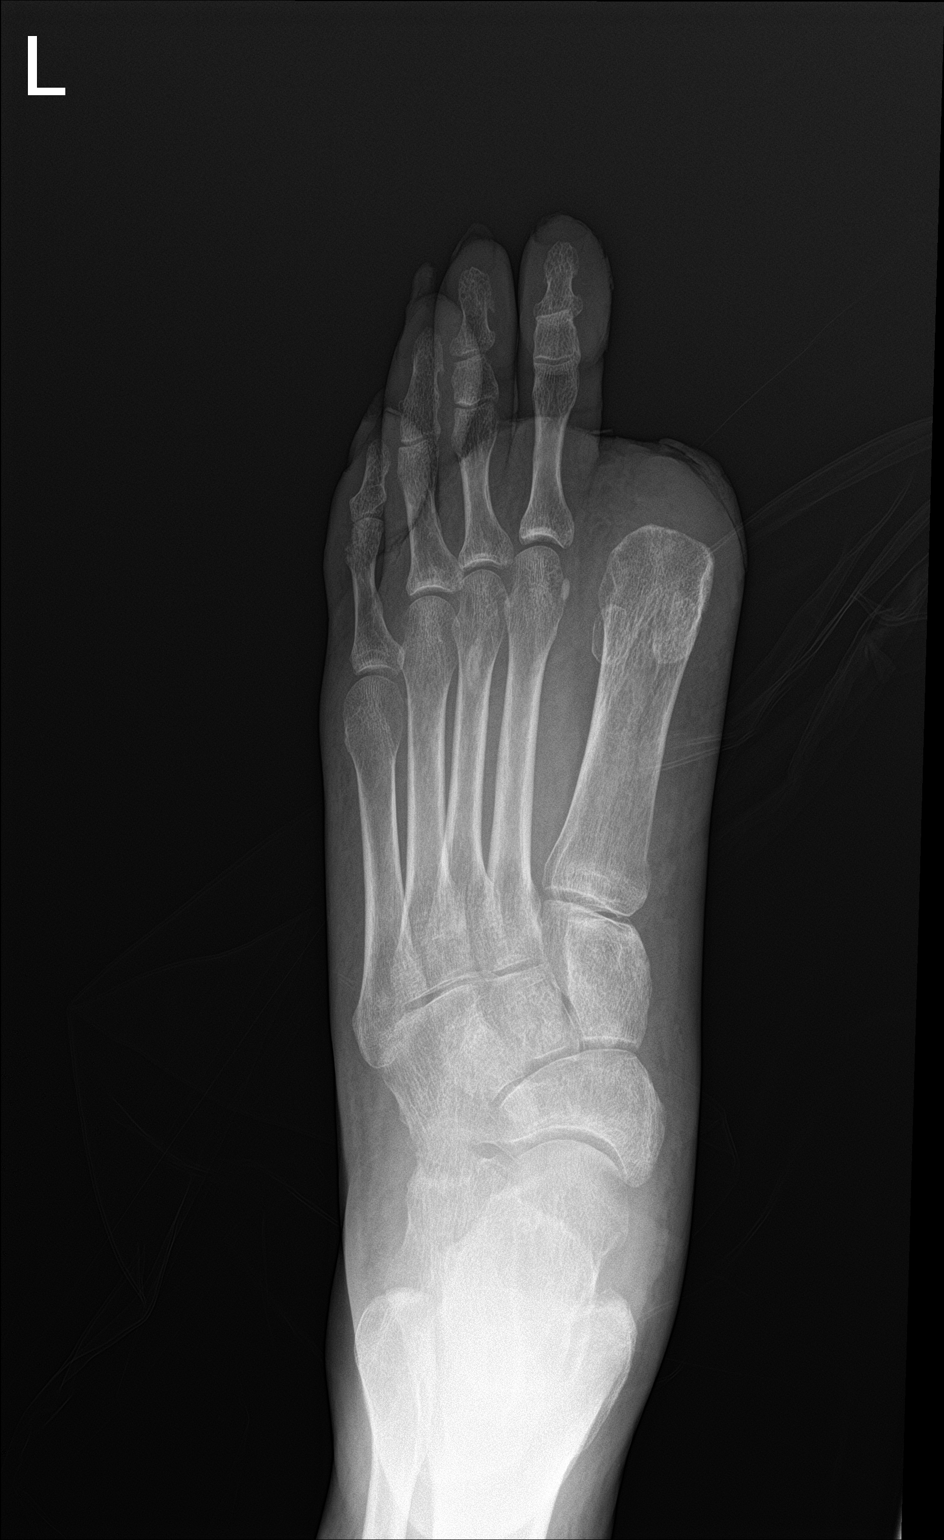

[foot obl]
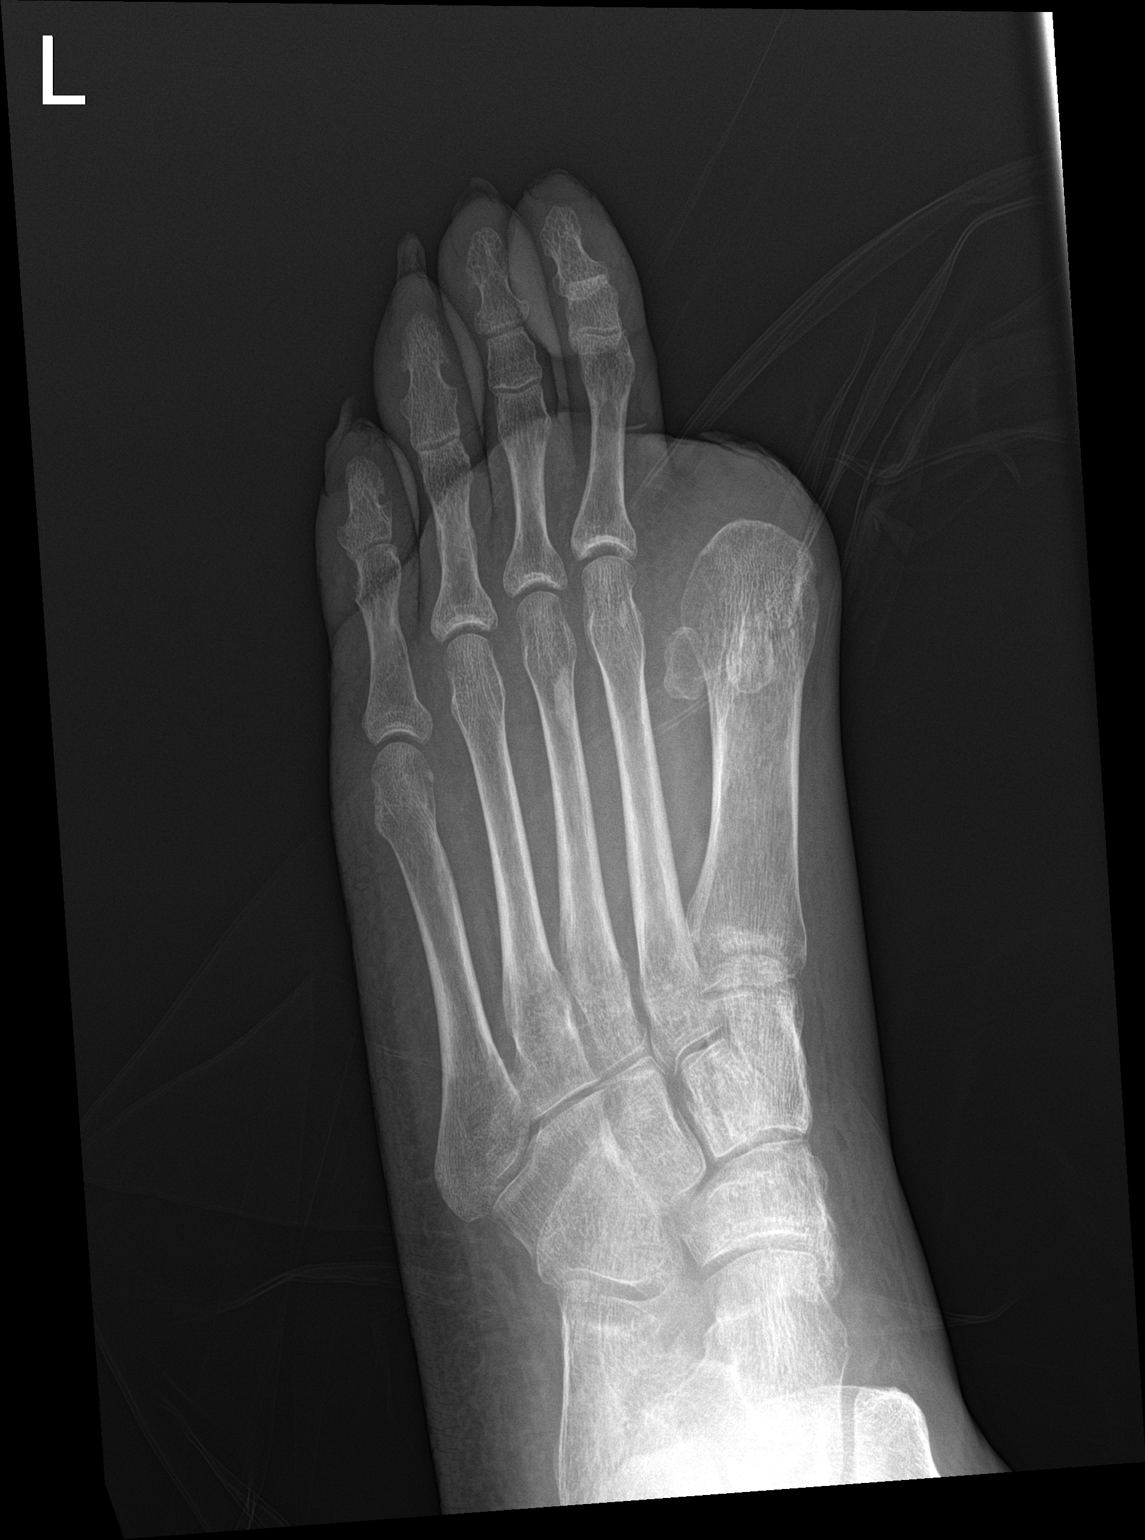

[foot lat]
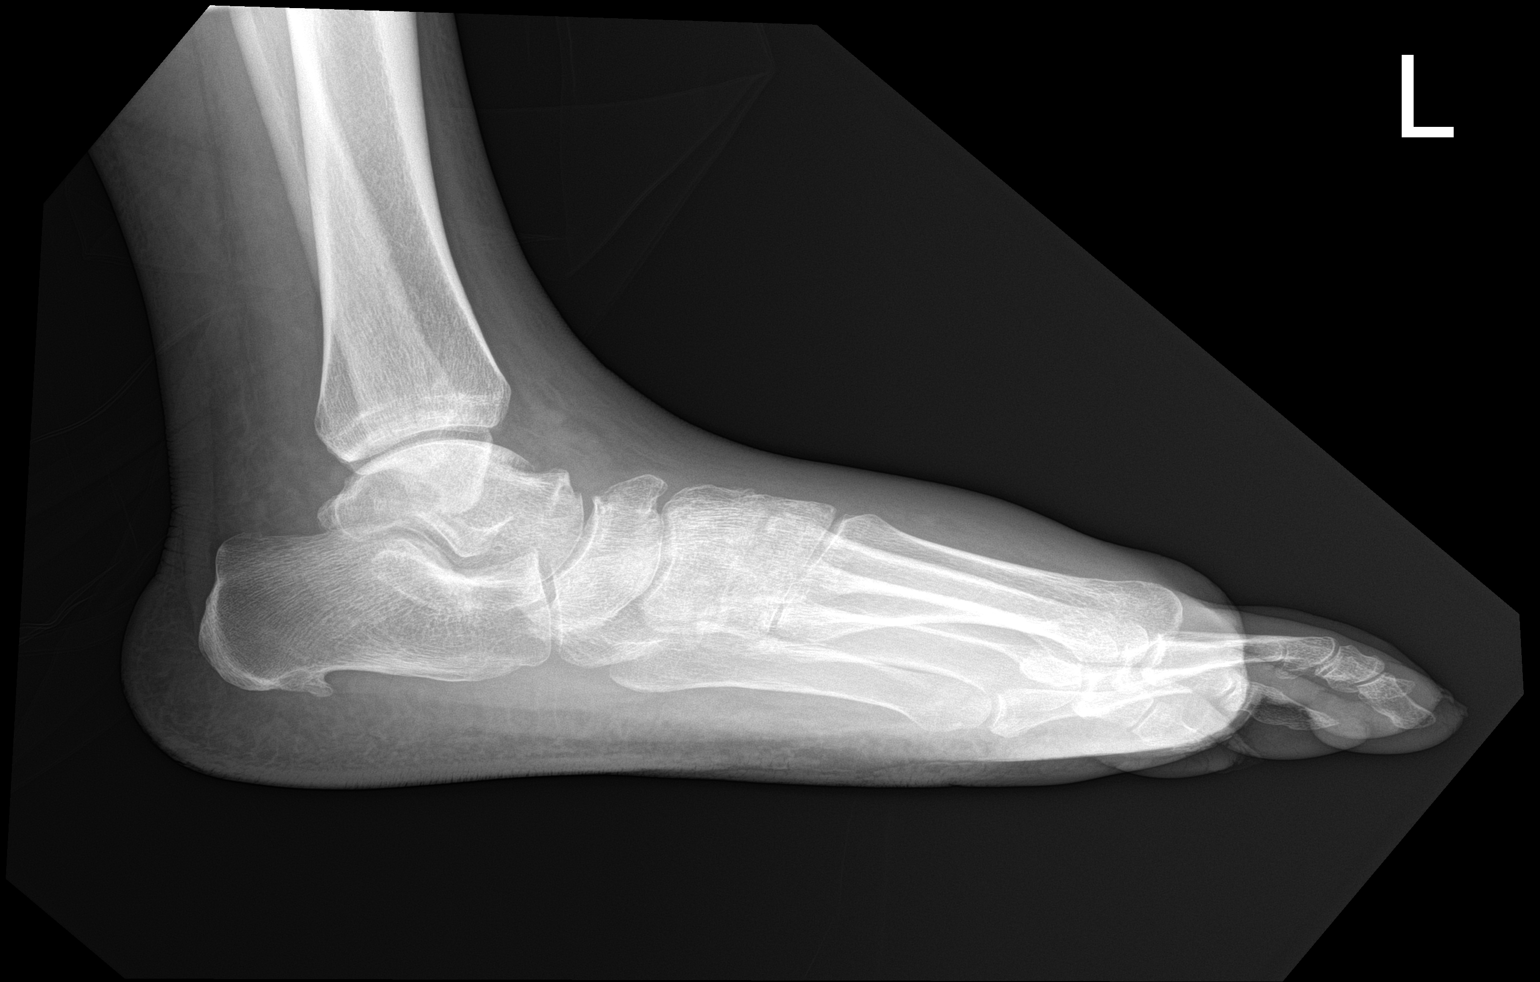

[3 of 3 positions shown; findings below may reference images not displayed]

FINDINGS: There is been prior amputation of the first toe. There is soft
tissue swelling of the toes and dorsum of the foot. There is no
radiopaque foreign body. There are no cortical erosions or
periosteal reaction. There is no acute fracture or dislocation. The
joint spaces appear well maintained.
IMPRESSION: 1. Prior amputation of the first toe.
2. Soft tissue swelling of the forefoot.
3. No acute bony abnormality.

## 2022-02-13 MED ORDER — MORPHINE SULFATE (PF) 4 MG/ML IV SOLN
4.0000 mg | Freq: Once | INTRAVENOUS | Status: AC
  Administered 2022-02-13: 4 mg via INTRAVENOUS
  Filled 2022-02-13: qty 1

## 2022-02-13 MED ORDER — SODIUM CHLORIDE 0.9 % IV BOLUS
1000.0000 mL | Freq: Once | INTRAVENOUS | Status: AC
  Administered 2022-02-13: 1000 mL via INTRAVENOUS

## 2022-02-13 NOTE — ED Notes (Signed)
Pt states that RN will not go in his room and became verbally aggressive toward RN, CNA and EDP ?

## 2022-02-13 NOTE — ED Triage Notes (Signed)
Pt complaining of bilateral leg pain, hx of PAD and dm, has not been taking his medication. ? ?[place] park screaming and rocking in pain. ?

## 2022-02-13 NOTE — ED Notes (Signed)
Pt given option of a cab voucher from social  work pt declined. Pt escorted out by security  ?

## 2022-02-13 NOTE — ED Provider Notes (Addendum)
?[facility] EMERGENCY DEPARTMENT ?Provider Note ? ? ?CSN: [id number] ?Arrival date & time: 02/13/22  1929 ? ?  ? ?History ? ?Chief Complaint  ?Patient presents with  ? Leg Pain  ? ? ?[name] is a 56 y.o. male with history of chronic foot pain, bipolar disorder, COPD, CHF, depression, GERD, homelessness, schizoaffective disorder.  Patient presents ED for evaluation of bilateral lower leg pain.  Patient states that for the last 3 days he has been having bilateral lower extremity pain that begins at his ankle and then travels down into his foot.  Patient states the pain is constant.  Patient has not attempted to alleviate pain utilizing OTC medications.  Patient recently discharged in this facility on 02/05/2022 and left AGAINST MEDICAL ADVICE.  Patient was admitted at this time due to headache, slurred speech due to hypertensive urgency.  Patient states he did not follow-up as instructed as an outpatient.  Patient denies any fever, nausea, vomiting, diarrhea, body aches or chills.  Patient endorses bilateral lower leg pain. ? ? ?Leg Pain ?Associated symptoms: no fever   ? ?  ? ?Home Medications ?Prior to Admission medications   ?Medication Sig Start Date End Date Taking? Authorizing Provider  ?oxyCODONE (OXY IR/ROXICODONE) 5 MG immediate release tablet Take 1 tablet (5 mg total) by mouth every 6 (six) hours as needed for severe pain or breakthrough pain. 12/09/21  Yes Meredeth Ide, MD  ?albuterol (VENTOLIN HFA) 108 (90 Base) MCG/ACT inhaler Inhale 2 puffs into the lungs every 6 (six) hours as needed for wheezing or shortness of breath. ?Patient not taking: Reported on 01/31/2022 11/09/21   Terald Sleeper, MD  ?atorvastatin (LIPITOR) 10 MG tablet Take 4 tablets (40 mg total) by mouth daily. ?Patient not taking: Reported on 01/31/2022 11/09/21   Terald Sleeper, MD  ?carvedilol (COREG) 6.25 MG tablet Take 1 tablet (6.25 mg total) by mouth 2 (two) times daily with a meal. ?Patient not taking:  Reported on 01/29/2022 12/09/21   Meredeth Ide, MD  ?levETIRAcetam (KEPPRA) 500 MG tablet Take 1 tablet (500 mg total) by mouth 2 (two) times daily. ?Patient not taking: Reported on 01/31/2022 11/09/21 04/05/22  Terald Sleeper, MD  ?OLANZapine (ZYPREXA) 5 MG tablet Take 1 tablet (5 mg total) by mouth daily. ?Patient not taking: Reported on 12/03/2021 11/25/21   Laveda Abbe, NP  ?spironolactone (ALDACTONE) 25 MG tablet Take 1 tablet (25 mg total) by mouth daily. ?Patient not taking: Reported on 01/31/2022 12/09/21   Meredeth Ide, MD  ?umeclidinium bromide (INCRUSE ELLIPTA) 62.5 MCG/INH AEPB Inhale 1 puff into the lungs daily. ?Patient not taking: Reported on 11/24/2021 11/16/20   Joycelyn Das, MD  ?   ? ?Allergies    ?Ibuprofen, Acetaminophen, Lyrica cr [pregabalin er], and Peanut-containing drug products   ? ?Review of Systems   ?Review of Systems  ?Constitutional:  Negative for chills and fever.  ?Gastrointestinal:  Negative for nausea and vomiting.  ?Musculoskeletal:  Positive for arthralgias and myalgias.  ?All other systems reviewed and are negative. ? ?Physical Exam ?Updated Vital Signs ?BP 106/78   Pulse (!) 54   Temp 97.8 ?F (36.6 ?C) (Oral)   Resp (!) 26   Ht 5\' 8"  (1.727 m)   Wt (!) 195 kg   SpO2 94%   BMI 65.37 kg/m?  ?Physical Exam ?Vitals and nursing note reviewed.  ?Constitutional:   ?   General: He is not in acute distress. ?   Appearance: He  is not ill-appearing, toxic-appearing or diaphoretic.  ?HENT:  ?   Head: Normocephalic and atraumatic.  ?   Nose: Nose normal.  ?   Mouth/Throat:  ?   Mouth: Mucous membranes are moist.  ?   Pharynx: Oropharynx is clear.  ?Eyes:  ?   Extraocular Movements: Extraocular movements intact.  ?   Conjunctiva/sclera: Conjunctivae normal.  ?   Pupils: Pupils are equal, round, and reactive to light.  ?Cardiovascular:  ?   Rate and Rhythm: Normal rate and regular rhythm.  ?   Pulses:     ?     Dorsalis pedis pulses are detected w/ Doppler on the right side and  detected w/ Doppler on the left side.  ?Pulmonary:  ?   Effort: Pulmonary effort is normal.  ?   Breath sounds: Normal breath sounds. No wheezing.  ?Abdominal:  ?   General: Abdomen is flat. Bowel sounds are normal.  ?   Tenderness: There is no abdominal tenderness.  ?Musculoskeletal:  ?   Cervical back: Normal range of motion and neck supple. No tenderness.  ?   Right lower leg: No edema.  ?   Left lower leg: No edema.  ?Feet:  ?   Right foot:  ?   Skin integrity: Skin breakdown, callus, dry skin and fissure present.  ?   Toenail Condition: Fungal disease present. ?   Left foot:  ?   Skin integrity: Skin breakdown, callus, dry skin and fissure present.  ?   Toenail Condition: Fungal disease present. ?   Comments: Patient with bilateral great toe amputations.  On patient right leg, chronic appearing venous stasis ulcer on medial side of ankle.  No drainage noted. ? ?No skin breakdown noted to patient left foot or ankle. ?Skin: ?   Capillary Refill: Capillary refill takes less than 2 seconds.  ?Neurological:  ?   Mental Status: He is alert and oriented to person, place, and time.  ? ? ?ED Results / Procedures / Treatments   ?Labs ?(all labs ordered are listed, but only abnormal results are displayed) ?Labs Reviewed  ?CBC WITH DIFFERENTIAL/PLATELET - Abnormal; Notable for the following components:  ?    Result Value  ? WBC 13.1 (*)   ? Neutro Abs 10.5 (*)   ? All other components within normal limits  ?COMPREHENSIVE METABOLIC PANEL - Abnormal; Notable for the following components:  ? CO2 18 (*)   ? BUN 27 (*)   ? Creatinine, Ser 1.53 (*)   ? Total Protein 6.3 (*)   ? Total Bilirubin 1.5 (*)   ? GFR, Estimated 54 (*)   ? All other components within normal limits  ?CBG MONITORING, ED - Abnormal; Notable for the following components:  ? Glucose-Capillary 110 (*)   ? All other components within normal limits  ?CK  ? ? ?EKG ?EKG Interpretation ? ?Date/Time:  Thursday February 13 2022 21:09:09 EDT ?Ventricular Rate:  122 ?PR  Interval:  162 ?QRS Duration: 83 ?QT Interval:  360 ?QTC Calculation: 513 ?R Axis:   64 ?Text Interpretation: Sinus tachycardia Confirmed by Virgina Norfolk [phone]) on 02/13/2022 9:38:11 PM ? ?Radiology ?DG Ankle Complete Left ? ?Result Date: 02/13/2022 ?CLINICAL DATA:  Pain. EXAM: LEFT ANKLE COMPLETE - 3+ VIEW COMPARISON:  None. FINDINGS: There is no evidence of fracture, dislocation, or joint effusion. There is no evidence of arthropathy or other focal bone abnormality. There is diffuse soft tissue swelling of the ankle. IMPRESSION: 1. Diffuse soft tissue swelling of the  ankle. 2. No acute bony abnormality. Electronically Signed   By: Darliss Cheney M.D.   On: 02/13/2022 21:00  ? ?DG Ankle Complete Right ? ?Result Date: 02/13/2022 ?CLINICAL DATA:  Right ankle pain. EXAM: RIGHT ANKLE - COMPLETE 3+ VIEW COMPARISON:  None. FINDINGS: There is no evidence of an acute fracture, dislocation, or joint effusion. There is a large plantar calcaneal spur. Degenerative changes are seen along the dorsal aspect of the proximal right foot. Small, superficial soft tissue defects are seen along medial aspect of the right ankle. IMPRESSION: 1. Degenerative changes without evidence of acute osseous abnormality. 2. Large plantar calcaneal spur. Electronically Signed   By: Aram Candela M.D.   On: 02/13/2022 21:00  ? ?DG Foot Complete Left ? ?Result Date: 02/13/2022 ?CLINICAL DATA:  Pain. EXAM: LEFT FOOT - COMPLETE 3+ VIEW COMPARISON:  None. 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 There is soft tissue swelling of the toes and dorsum of the foot. There is no radiopaque foreign body. There are no cortical erosions or periosteal reaction. There is no acute fracture or dislocation. The joint spaces appear well maintained. IMPRESSION: 1. Prior amputation of the first toe. 2. Soft tissue swelling of the forefoot. 3. No acute bony abnormality. Electronically Signed   By: Darliss Cheney M.D.   On: 02/13/2022 20:59    ? ?Procedures ?Procedures  ? ?Medications Ordered in ED ?Medications  ?sodium chloride 0.9 % bolus 1,000 mL (0 mLs Intravenous Stopped 02/13/22 2207)  ?morphine (PF) 4 MG/ML injection 4 mg (4 mg Intravenous Given 02/13/22 2108)

## 2022-02-13 NOTE — Progress Notes (Signed)
Transition of Care [facility]) - Emergency Department Mini Assessment ? ? ?Patient Details  ?Name: [name] ?MRN: [id number] ?Date of Birth: 29-Nov-1966 ? ?Transition of Care (TOC) CM/SW Contact:    ?Lima Chillemi, [name] ?Phone Number: ?02/13/2022, 10:44 PM ? ? ?Clinical Narrative: ? ? ? ?ED Mini Assessment: ?What brought you to the Emergency Department? : (P) leg pain ? ?Barriers to Discharge: (P) No Barriers Identified ? ?Barrier interventions: (P) CSW met with Pt at bedside for transportation consult. CSW offered Pt taxi voucher for d/c transport, but Pt became agitated and stated he did not wish for taxi, he would be calling his social worker because it is his "right".  Pt stated that he was angry but was unable to articulate why he was angry. CSW offered to assit Pt with gathering his belongings and Pt continued being aggressive. Due to Pt's pass and current aggitated state, security was called to escort Pt to lobby where he was able to use phone to call his Education officer, museum. CSW gave security taxi voucher in case Pt is unable to reach a ride for d/c. ? ?  ? ?Interventions which prevented an admission or readmission: (P) Transportation Screening ? ? ? ?Patient Contact and Communications ?  ?  ?  ? ,     ?  ?  ? ?  ?  ?  ? ?Admission diagnosis:  leg pain ?Patient Active Problem List  ? Diagnosis Date Noted  ? Venous stasis ulcer of right ankle limited to breakdown of skin with varicose veins (HCC)   ? Hypertensive urgency 01/29/2022  ? Prolonged QT interval 01/29/2022  ? Alcohol abuse 01/29/2022  ? chronic ulcers 01/29/2022  ? Wound infection 12/04/2021  ? Open wounds involving multiple regions of lower extremity 12/03/2021  ? Mixed diabetic hyperlipidemia associated with type 2 diabetes mellitus ([place]) 12/03/2021  ? Cellulitis of right lower extremity 11/13/2021  ? Type 2 diabetes mellitus with diabetic polyneuropathy, without long-term current use of insulin ([place]) 11/13/2021  ? Antisocial personality disorder  ([place]) 11/04/2021  ? Malingering 11/04/2021  ? Foot pain 04/23/2021  ? Chronic combined systolic and diastolic congestive heart failure ([place]) 03/02/2021  ? History of stroke 03/02/2021  ? Substance use disorder 03/02/2021  ? Skin ulcer of right great toe with necrosis of bone ([place])   ? Encounter for assessment of healthcare decision-making capacity   ? Pain, dental   ? Dystonia   ? Dysarthria   ? Shock liver 01/04/2021  ? Acute metabolic encephalopathy 67/34/1937  ? Coronary artery disease involving native coronary artery of native heart without angina pectoris 01/04/2021  ? Bipolar I disorder ([place]) 01/04/2021  ? Cocaine abuse ([place]) 01/04/2021  ? Homelessness 01/04/2021  ? HLD (hyperlipidemia) 01/04/2021  ? Osteomyelitis of great toe ([place])   ? Ulcer of great toe, left, with necrosis of bone ([place])   ? Ulcer of great toe, right, with necrosis of muscle ([place])   ? Acute osteomyelitis of toe, left ([place])   ? Acute on chronic combined systolic and diastolic CHF (congestive heart failure) ([place]) 12/20/2020  ? Respiratory failure ([place])   ? Ischemic stroke ([place]) 12/17/2020  ? 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 Diabetic foot ulcer ([place]) 12/08/2020  ? CHF (congestive heart failure) ([place]) 11/14/2020  ? CHF exacerbation ([place]) 11/13/2020  ? Hypokalemia   ? Hyperlipidemia   ? Acute respiratory failure with hypoxia ([place]) 10/26/2020  ? COPD with acute exacerbation ([place]) 10/14/2020  ? Acute on chronic combined systolic and diastolic  CHF (congestive heart failure) ([place]) 10/02/2020  ? Near syncope 07/21/2020  ? Schizoaffective disorder ([place]) 06/24/2019  ? SOB (shortness of breath)   ? QT prolongation 05/25/2019  ? Cocaine abuse ([place])   ? Elevated troponin   ? Homeless   ? Suicidal ideation   ? Bilateral foot pain 04/05/2019  ? Housing situation unstable 03/28/2019  ? Bilateral lower extremity edema   ? Asthma 12/26/2016  ? Tobacco use disorder 12/26/2016  ? Schizophrenia ([place]) 12/26/2016  ? Cocaine abuse with cocaine-induced mood  disorder ([place]) 09/06/2016  ? Alcohol use disorder, mild, abuse 07/31/2016  ? Substance induced mood disorder ([place]) 07/30/2016  ? Schizoaffective disorder, bipolar type ([place]) 05/31/2016  ? Cocaine use disorder, severe, dependence ([place]) 05/31/2016  ? ?PCP:  Pcp, No ?Pharmacy:   ?[facility] [address] ?[address] ?[place] [address] ?Phone: [phone] Fax: [phone] ? ?[facility] ?[address] ?[place] [address] ?Phone: [phone] Fax: [phone] ?  ?

## 2022-02-13 NOTE — ED Notes (Signed)
Patient transported to X-ray 

## 2022-02-13 NOTE — ED Notes (Addendum)
Pt called out asking to speak with the charge nurse, RN went to pt;s room to find out what matter this was concerning in order to communicate to the charge nurse and fix the situation. Pt became verbally aggressive cuttingthe RN off, yelling and stating "this does not concern you" Pt educated that the charge nurse will need to know what the subject at hand is prior to the discussion pt continue to cut off the RN and become more verbally aggressive. Charge nurse and EDP aware. ?

## 2022-02-13 NOTE — ED Provider Notes (Addendum)
Shared visit.  Patient with history of polysubstance abuse, homelessness, schizophrenia, chronic wounds to his feet who presents with ongoing bilateral foot pain.  Patient has overall well-appearing wounds on both feet.  There is no purulent drainage.  There is no warmth.  He has no fever.  Vital signs overall unremarkable.  X-rays were obtained that show no evidence of osteomyelitis.  Nonspecific soft tissue swelling that is likely from chronic venous stasis changes.  He has no major leukocytosis or anemia or electrolyte abnormality.  Patient was given IV morphine.  Patient has good pulses in his lower legs.  I have no concern for peripheral arterial disease.  No concern for DVT.  No concern for active infection.  Overall appears to be acute on chronic pain.  Patient started to be verbally abusive to staff and ripped out his IV.  At that time he was discharged and asked for security be available for removal.  Hemodynamically stable throughout her care. ? ?This chart was dictated using voice recognition software.  Despite best efforts to proofread,  errors can occur which can change the documentation meaning. ? ?  ?Virgina Norfolk, DO ?02/13/22 2208 ? ?  ?Virgina Norfolk, DO ?02/13/22 2209 ? ?

## 2022-02-13 NOTE — ED Notes (Signed)
This Clinical research associate came into the room with [name] Mare, RN and asked why he was upset. Pt was not answering why he was upset and immediately became verbally abusive and saying " I don't want her to be my nurse, I want her to get the fuck out of my room". EDP and PA came into room to further de-escalate situation. Pt still not willing to explain why he was upset with his care and demanded that we call his own personal Child psychotherapist. Pt still in bed, with call light in reach. ?

## 2022-02-20 ENCOUNTER — Telehealth: Payer: Self-pay | Admitting: *Deleted

## 2022-02-20 NOTE — Telephone Encounter (Signed)
Pt advocate and student of SW Antonietta Breach) called requesting to be called when pt visits ED. ?

## 2022-02-24 NOTE — Progress Notes (Deleted)
?  ?Cardiology Office Note ? ? ?Date:  02/24/2022  ? ?ID:  Guerino Marczak, DOB [birth year], MRN [id number] ? ?PCP:  Pcp, No  ?Cardiologist:   Minus Breeding, MD ?Referring:  *** ? ?No chief complaint on file. ? ? ?  ?History of Present Illness: ?[name] is a 56 y.o. male who presents for evaluation of systolic and diastolic HF.    In 2018 with an LVEF of 35 to 40%.  Unfortunately repeat echocardiogram in 03/2019 showed an LVEF of 15%.  09/26/2020 showed an LVEF of 40 to 45%.  Echo 01/29/2022 EF was 20%.  He underwent a heart catheterization at [facility] 10/2019 that showed diffuse atherosclerosis in all coronary distributions with the worst lesion 60 to 70% in the proximal circumflex.  Degree of cardiomyopathy felt greater than the degree of CAD suggesting a nonischemic etiology.  He underwent coronary CTA 03/2020 which revealed moderate to severe RCA, LAD, and circumflex disease.  He was seen by cardiology during the hospitalization in January 2022 when he was found down, initially called as a code stroke, confirmed on head imaging presumed cardioembolic.  We last saw him earlier this month for acute on chronic HF.  His course is complicated by medical non compliance, homelessness and substance abuse.  .  ***   ? ? ? ?Echocardiogram , up from 15% 03/2019, mild concentric LVH, grade 2 diastolic dysfunction, no regional wall motion abnormality, moderate LAE, mild RAE, and trivial MR.  He has reported anaphylaxis to NSAIDs, aspirin was avoided and Plavix was started given the extent of his CAD.  He was continued on statin.  Goal-directed medical therapy for his heart failure included hydralazine and Imdur.  Beta-blocker not started due to cocaine history.  ACE I/ARB/ARNI/Aldactone not started due to labile renal function and lack of follow-up. ?  ?He has apparently been homeless since 2013.  He has a history of multiple ER visits for suicidal ideation with a schizoaffective disorder.  He also has documented  history of refusing medications and evaluation by psychiatry.  ?  ?.  He was discharged on aspirin and Lipitor.  He was also discharged on carvedilol, Entresto, Imdur, hydralazine, Lasix, and spironolactone. ?  ?Since that time he has had multiple ER visits.  He presented to [facility] ED 01/29/2022 with headache, slurred speech, and hypertensive urgency.  MRI brain was negative for acute stroke.  He was also treated for COPD exacerbation and has been tapering off steroids.  Wheezing felt likely due to CHF. ?  ?Echocardiogram repeated 01/30/2022 and showed LVEF less than 20%, mild LVH, grade 3 diastolic dysfunction, elevated LVEDP, RV systolic pressure [id number] mmHg, biatrial enlargement, and mild to moderate MR.  ?  ?Cardiology was consulted for management of his heart failure.  He has been verbally abusive to staff and has refused medications and testing.  Psych, dietary was consulted and deemed the patient has capacity to refuse medication and treatment.  He was placed on a CIWA protocol and has refused Librium.  He has been counseled to stop cocaine, alcohol and cigarette smoking.  Ortho has been consulted for chronic lower extremity venous stasis ulcers present on admission.  Patient has refused social work placement after discharge. ?  ?BNP 1100 ?AKI has resolved 1.69 --> 1.15 ?  ?He was diuresed with 20 mg and then 40 mg IV lasix and has now been switched to 20 mg PO lasix. He is overall net negative 10 L with 5.7 cc urine output yesterday. Weight is  actually up from discharge.  ?  ?Currently on 325 mg ASA, 40 mg lipitor, 20-37.5 mg bidil, and 12.5 mg spironolactone.  ?  ?Clonidine discontinued.  ?  ?CTA and venogram for headache negative for large vessel occlusion. 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 ?  ?During my exam, he would not answer questions regarding symptoms. He complained of not being able to get medications and no one explaining what medications he was on and what they were for - I explained all  cardiac medications and tried to discuss how we can overcome barriers to getting his medications. He informed me he had a SW and dismissed me from the room.  ?  ? ? ?Past Medical History:  ?Diagnosis Date  ? Asthma   ? Bipolar 1 disorder ([place])   ? CHF (congestive heart failure) ([place])   ? COPD (chronic obstructive pulmonary disease) ([place])   ? Depression   ? Diabetes mellitus without complication ([place])   ? GERD (gastroesophageal reflux disease)   ? Homelessness   ? Hypertension   ? QT prolongation   ? Schizoaffective disorder ([place])   ? ? ?Past Surgical History:  ?Procedure Laterality Date  ? 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 Procedure: AMPUTATION TOE;  Surgeon: Evelina Bucy, DPM;  Location: [facility];  Service: Podiatry;  Laterality: Bilateral;  Ok to leave patient on hospital bed  ? IR ANGIO INTRA EXTRACRAN SEL COM CAROTID INNOMINATE BILAT MOD SED  12/17/2020  ? IR ANGIO VERTEBRAL SEL VERTEBRAL BILAT MOD SED  12/17/2020  ? RADIOLOGY WITH ANESTHESIA N/A 12/17/2020  ? Procedure: IR WITH ANESTHESIA;  Surgeon: Radiologist, Medication, MD;  Location: [facility];  Service: Radiology;  Laterality: N/A;  ? ? ? ?Current Outpatient Medications  ?Medication Sig Dispense Refill  ? albuterol (VENTOLIN HFA) 108 (90 Base) MCG/ACT inhaler Inhale 2 puffs into the lungs every 6 (six) hours as needed for wheezing or shortness of breath. (Patient not taking: Reported on 01/31/2022) 8 g 2  ? atorvastatin (LIPITOR) 10 MG tablet Take 4 tablets (40 mg total) by mouth daily. (Patient not taking: Reported on 01/31/2022) 120 tablet 0  ? carvedilol (COREG) 6.25 MG tablet Take 1 tablet (6.25 mg total) by mouth 2 (two) times daily with a meal. (Patient not taking: Reported on 01/29/2022) 60 tablet 2  ? levETIRAcetam (KEPPRA) 500 MG tablet Take 1 tablet (500 mg total) by mouth 2 (two) times daily. (Patient not taking: Reported on 01/31/2022) 60 tablet 0  ? OLANZapine (ZYPREXA) 5 MG tablet Take 1 tablet (5 mg total) by mouth daily. (Patient not taking: Reported  on 12/03/2021) 14 tablet 0  ? oxyCODONE (OXY IR/ROXICODONE) 5 MG immediate release tablet Take 1 tablet (5 mg total) by mouth every 6 (six) hours as needed for severe pain or breakthrough pain. 20 tablet 0  ? spironolactone (ALDACTONE) 25 MG tablet Take 1 tablet (25 mg total) by mouth daily. (Patient not taking: Reported on 01/31/2022) 30 tablet 3  ? umeclidinium bromide (INCRUSE ELLIPTA) 62.5 MCG/INH AEPB Inhale 1 puff into the lungs daily. (Patient not taking: Reported on 11/24/2021) 30 each 2  ? ?No current facility-administered medications for this visit.  ? ? ?Allergies:   Ibuprofen, Acetaminophen, Lyrica cr [pregabalin er], and Peanut-containing drug products  ? ? ?Social History:  The patient  reports that he has been smoking cigarettes. He has never used smokeless tobacco. He reports current alcohol use. He reports current drug use. Drugs: Cocaine, Marijuana, and "Crack" cocaine.  ? ?Family  History:  The patient's ***family history includes Drug abuse in his mother; Suicidality in his mother.  ? ? ?ROS:  Please see the history of present illness.   Otherwise, review of systems are positive for {NONE DEFAULTED:18576}.   All other systems are reviewed and negative.  ? ? ?PHYSICAL EXAM: ?VS:  There were no vitals taken for this visit. , BMI There is no height or weight on file to calculate BMI. ?GENERAL:  Well appearing ?HEENT:  Pupils equal round and reactive, fundi not visualized, oral mucosa unremarkable ?NECK:  No jugular venous distention, waveform within normal limits, carotid upstroke brisk and symmetric, no bruits, no thyromegaly ?LYMPHATICS:  No cervical, inguinal adenopathy ?LUNGS:  Clear to auscultation bilaterally ?BACK:  No CVA tenderness ?CHEST:  Unremarkable ?HEART:  PMI not displaced or sustained,S1 and S2 within normal limits, no S3, no S4, no clicks, no rubs, *** murmurs ?ABD:  Flat, positive bowel sounds normal in frequency in pitch, no bruits, no rebound, no guarding, no midline pulsatile mass,  no hepatomegaly, no splenomegaly ?EXT:  2 plus pulses throughout, no edema, no cyanosis no clubbing ?SKIN:  No rashes no nodules ?NEURO:  Cranial nerves II through XII grossly intact, motor grossly intact t

## 2022-02-25 ENCOUNTER — Ambulatory Visit: Payer: Medicaid Other | Admitting: Cardiology

## 2022-02-25 ENCOUNTER — Telehealth: Payer: Self-pay | Admitting: Licensed Clinical Social Worker

## 2022-02-25 ENCOUNTER — Telehealth: Payer: Self-pay | Admitting: Cardiology

## 2022-02-25 DIAGNOSIS — I5043 Acute on chronic combined systolic (congestive) and diastolic (congestive) heart failure: Secondary | ICD-10-CM

## 2022-02-25 DIAGNOSIS — I2510 Atherosclerotic heart disease of native coronary artery without angina pectoris: Secondary | ICD-10-CM

## 2022-02-25 NOTE — Telephone Encounter (Signed)
Kelli Hope called in on behalf of the patient. She states she is returning a call, but she is unsure what it was regarding. No voicemail received. Patient is scheduled for today, 3/28 with Dr. [name]. I informed her that her call will be returned if the call was for anything in particular. ?

## 2022-02-25 NOTE — Telephone Encounter (Signed)
Unsure what call was in regards to.  Will await follow up appointment today with Dr. [name].  ?

## 2022-02-25 NOTE — Telephone Encounter (Signed)
Clinic leadership had received call from SW intern from [facility] who supports pt inquiring if we could provide FL2/PASRR. Noted that an FL2 on file from recent hospital stay and PASRR valid through 03/08/22. I shared that since we have not seen pt and pt w/ SPMI and additional ongoing non cardiology needs an FL2 would best be completed by a PCP office at this time. SW intern and pt can request FL2 from medical records if needed and locate PASRR on [facility] system. I remain available, note pt no showed this morning for appt.  ? ?Octavio Graves, MSW, [name] ?Clinical Social Worker II ?[facility]  ?[phone]- work cell phone (preferred) ?[phone]- desk phone ? ?

## 2022-02-26 ENCOUNTER — Inpatient Hospital Stay (HOSPITAL_COMMUNITY)
Admission: EM | Admit: 2022-02-26 | Discharge: 2022-03-07 | DRG: 291 | Disposition: A | Discharge status: Home / Self Care | Payer: Medicaid Other | Attending: Family Medicine | Admitting: Family Medicine

## 2022-02-26 ENCOUNTER — Emergency Department (HOSPITAL_COMMUNITY): Payer: Medicaid Other

## 2022-02-26 ENCOUNTER — Encounter (HOSPITAL_BASED_OUTPATIENT_CLINIC_OR_DEPARTMENT_OTHER): Payer: Medicaid Other | Admitting: Physician Assistant

## 2022-02-26 ENCOUNTER — Other Ambulatory Visit: Payer: Self-pay

## 2022-02-26 ENCOUNTER — Encounter (HOSPITAL_COMMUNITY): Payer: Self-pay

## 2022-02-26 ENCOUNTER — Telehealth: Payer: Self-pay | Admitting: Licensed Clinical Social Worker

## 2022-02-26 DIAGNOSIS — F1420 Cocaine dependence, uncomplicated: Secondary | ICD-10-CM | POA: Diagnosis present

## 2022-02-26 DIAGNOSIS — J441 Chronic obstructive pulmonary disease with (acute) exacerbation: Secondary | ICD-10-CM

## 2022-02-26 DIAGNOSIS — R569 Unspecified convulsions: Secondary | ICD-10-CM | POA: Diagnosis not present

## 2022-02-26 DIAGNOSIS — E1142 Type 2 diabetes mellitus with diabetic polyneuropathy: Secondary | ICD-10-CM | POA: Diagnosis present

## 2022-02-26 DIAGNOSIS — Z5902 Unsheltered homelessness: Secondary | ICD-10-CM | POA: Diagnosis not present

## 2022-02-26 DIAGNOSIS — Y92239 Unspecified place in hospital as the place of occurrence of the external cause: Secondary | ICD-10-CM | POA: Diagnosis not present

## 2022-02-26 DIAGNOSIS — L97312 Non-pressure chronic ulcer of right ankle with fat layer exposed: Secondary | ICD-10-CM | POA: Diagnosis present

## 2022-02-26 DIAGNOSIS — Z89412 Acquired absence of left great toe: Secondary | ICD-10-CM

## 2022-02-26 DIAGNOSIS — F1010 Alcohol abuse, uncomplicated: Secondary | ICD-10-CM | POA: Diagnosis not present

## 2022-02-26 DIAGNOSIS — L97512 Non-pressure chronic ulcer of other part of right foot with fat layer exposed: Secondary | ICD-10-CM | POA: Diagnosis present

## 2022-02-26 DIAGNOSIS — L97822 Non-pressure chronic ulcer of other part of left lower leg with fat layer exposed: Secondary | ICD-10-CM | POA: Insufficient documentation

## 2022-02-26 DIAGNOSIS — F17210 Nicotine dependence, cigarettes, uncomplicated: Secondary | ICD-10-CM | POA: Diagnosis present

## 2022-02-26 DIAGNOSIS — I2510 Atherosclerotic heart disease of native coronary artery without angina pectoris: Secondary | ICD-10-CM | POA: Diagnosis present

## 2022-02-26 DIAGNOSIS — I5042 Chronic combined systolic (congestive) and diastolic (congestive) heart failure: Secondary | ICD-10-CM | POA: Insufficient documentation

## 2022-02-26 DIAGNOSIS — L97519 Non-pressure chronic ulcer of other part of right foot with unspecified severity: Secondary | ICD-10-CM | POA: Diagnosis present

## 2022-02-26 DIAGNOSIS — N179 Acute kidney failure, unspecified: Secondary | ICD-10-CM | POA: Diagnosis not present

## 2022-02-26 DIAGNOSIS — Z886 Allergy status to analgesic agent status: Secondary | ICD-10-CM

## 2022-02-26 DIAGNOSIS — E11621 Type 2 diabetes mellitus with foot ulcer: Secondary | ICD-10-CM | POA: Insufficient documentation

## 2022-02-26 DIAGNOSIS — E876 Hypokalemia: Secondary | ICD-10-CM | POA: Diagnosis present

## 2022-02-26 DIAGNOSIS — I248 Other forms of acute ischemic heart disease: Secondary | ICD-10-CM | POA: Diagnosis present

## 2022-02-26 DIAGNOSIS — I5023 Acute on chronic systolic (congestive) heart failure: Secondary | ICD-10-CM | POA: Insufficient documentation

## 2022-02-26 DIAGNOSIS — K219 Gastro-esophageal reflux disease without esophagitis: Secondary | ICD-10-CM | POA: Diagnosis present

## 2022-02-26 DIAGNOSIS — Z813 Family history of other psychoactive substance abuse and dependence: Secondary | ICD-10-CM

## 2022-02-26 DIAGNOSIS — I5043 Acute on chronic combined systolic (congestive) and diastolic (congestive) heart failure: Secondary | ICD-10-CM | POA: Diagnosis not present

## 2022-02-26 DIAGNOSIS — Z89411 Acquired absence of right great toe: Secondary | ICD-10-CM

## 2022-02-26 DIAGNOSIS — Z91010 Allergy to peanuts: Secondary | ICD-10-CM | POA: Diagnosis not present

## 2022-02-26 DIAGNOSIS — F17200 Nicotine dependence, unspecified, uncomplicated: Secondary | ICD-10-CM | POA: Diagnosis present

## 2022-02-26 DIAGNOSIS — E785 Hyperlipidemia, unspecified: Secondary | ICD-10-CM | POA: Diagnosis present

## 2022-02-26 DIAGNOSIS — Z79899 Other long term (current) drug therapy: Secondary | ICD-10-CM | POA: Diagnosis not present

## 2022-02-26 DIAGNOSIS — J449 Chronic obstructive pulmonary disease, unspecified: Secondary | ICD-10-CM | POA: Insufficient documentation

## 2022-02-26 DIAGNOSIS — R7989 Other specified abnormal findings of blood chemistry: Secondary | ICD-10-CM | POA: Diagnosis present

## 2022-02-26 DIAGNOSIS — Z5900 Homelessness unspecified: Secondary | ICD-10-CM | POA: Insufficient documentation

## 2022-02-26 DIAGNOSIS — I872 Venous insufficiency (chronic) (peripheral): Secondary | ICD-10-CM | POA: Diagnosis present

## 2022-02-26 DIAGNOSIS — R471 Dysarthria and anarthria: Secondary | ICD-10-CM | POA: Diagnosis present

## 2022-02-26 DIAGNOSIS — Z8673 Personal history of transient ischemic attack (TIA), and cerebral infarction without residual deficits: Secondary | ICD-10-CM

## 2022-02-26 DIAGNOSIS — Z20822 Contact with and (suspected) exposure to covid-19: Secondary | ICD-10-CM | POA: Diagnosis present

## 2022-02-26 DIAGNOSIS — E11622 Type 2 diabetes mellitus with other skin ulcer: Secondary | ICD-10-CM | POA: Diagnosis present

## 2022-02-26 DIAGNOSIS — Z91199 Patient's noncompliance with other medical treatment and regimen due to unspecified reason: Secondary | ICD-10-CM

## 2022-02-26 DIAGNOSIS — F250 Schizoaffective disorder, bipolar type: Secondary | ICD-10-CM | POA: Diagnosis present

## 2022-02-26 DIAGNOSIS — E1151 Type 2 diabetes mellitus with diabetic peripheral angiopathy without gangrene: Secondary | ICD-10-CM | POA: Diagnosis present

## 2022-02-26 DIAGNOSIS — I444 Left anterior fascicular block: Secondary | ICD-10-CM | POA: Diagnosis present

## 2022-02-26 DIAGNOSIS — I110 Hypertensive heart disease with heart failure: Secondary | ICD-10-CM | POA: Insufficient documentation

## 2022-02-26 DIAGNOSIS — Z888 Allergy status to other drugs, medicaments and biological substances status: Secondary | ICD-10-CM

## 2022-02-26 DIAGNOSIS — R778 Other specified abnormalities of plasma proteins: Secondary | ICD-10-CM | POA: Diagnosis not present

## 2022-02-26 DIAGNOSIS — I509 Heart failure, unspecified: Principal | ICD-10-CM

## 2022-02-26 DIAGNOSIS — T502X5A Adverse effect of carbonic-anhydrase inhibitors, benzothiadiazides and other diuretics, initial encounter: Secondary | ICD-10-CM | POA: Diagnosis not present

## 2022-02-26 LAB — CBC WITH DIFFERENTIAL/PLATELET
Abs Immature Granulocytes: 0.02 10*3/uL (ref 0.00–0.07)
Basophils Absolute: 0 10*3/uL (ref 0.0–0.1)
Basophils Relative: 1 %
Eosinophils Absolute: 0.1 10*3/uL (ref 0.0–0.5)
Eosinophils Relative: 1 %
HCT: 41.9 % (ref 39.0–52.0)
Hemoglobin: 14 g/dL (ref 13.0–17.0)
Immature Granulocytes: 0 %
Lymphocytes Relative: 32 %
Lymphs Abs: 2 10*3/uL (ref 0.7–4.0)
MCH: 31.7 pg (ref 26.0–34.0)
MCHC: 33.4 g/dL (ref 30.0–36.0)
MCV: 95 fL (ref 80.0–100.0)
Monocytes Absolute: 0.7 10*3/uL (ref 0.1–1.0)
Monocytes Relative: 11 %
Neutro Abs: 3.5 10*3/uL (ref 1.7–7.7)
Neutrophils Relative %: 55 %
Platelets: 413 10*3/uL — ABNORMAL HIGH (ref 150–400)
RBC: 4.41 MIL/uL (ref 4.22–5.81)
RDW: 14.6 % (ref 11.5–15.5)
WBC: 6.4 10*3/uL (ref 4.0–10.5)
nRBC: 0 % (ref 0.0–0.2)

## 2022-02-26 LAB — BASIC METABOLIC PANEL
Anion gap: 8 (ref 5–15)
BUN: 20 mg/dL (ref 6–20)
CO2: 21 mmol/L — ABNORMAL LOW (ref 22–32)
Calcium: 8.5 mg/dL — ABNORMAL LOW (ref 8.9–10.3)
Chloride: 107 mmol/L (ref 98–111)
Creatinine, Ser: 1 mg/dL (ref 0.61–1.24)
GFR, Estimated: >60 mL/min (ref >60)
Glucose, Bld: 97 mg/dL (ref 70–99)
Potassium: 3.8 mmol/L (ref 3.5–5.1)
Sodium: 136 mmol/L (ref 135–145)

## 2022-02-26 LAB — RESP PANEL BY RT-PCR (FLU A&B, COVID) ARPGX2
Influenza A by PCR: NEGATIVE
Influenza B by PCR: NEGATIVE
SARS Coronavirus 2 by RT PCR: NEGATIVE

## 2022-02-26 LAB — TROPONIN I (HIGH SENSITIVITY)
Troponin I (High Sensitivity): 64 ng/L — ABNORMAL HIGH (ref <18)
Troponin I (High Sensitivity): 60 ng/L — ABNORMAL HIGH (ref <18)

## 2022-02-26 LAB — BRAIN NATRIURETIC PEPTIDE: B Natriuretic Peptide: 1573.5 pg/mL — ABNORMAL HIGH (ref 0.0–100.0)

## 2022-02-26 IMAGING — CR DG CHEST 2V
3 series · 3 of 3 positions shown · non-contrast
Comparison: [DATE]

CLINICAL DATA: Shortness of breath

EXAM:
CHEST - 2 VIEW

[w chest lat (1 of 2)]
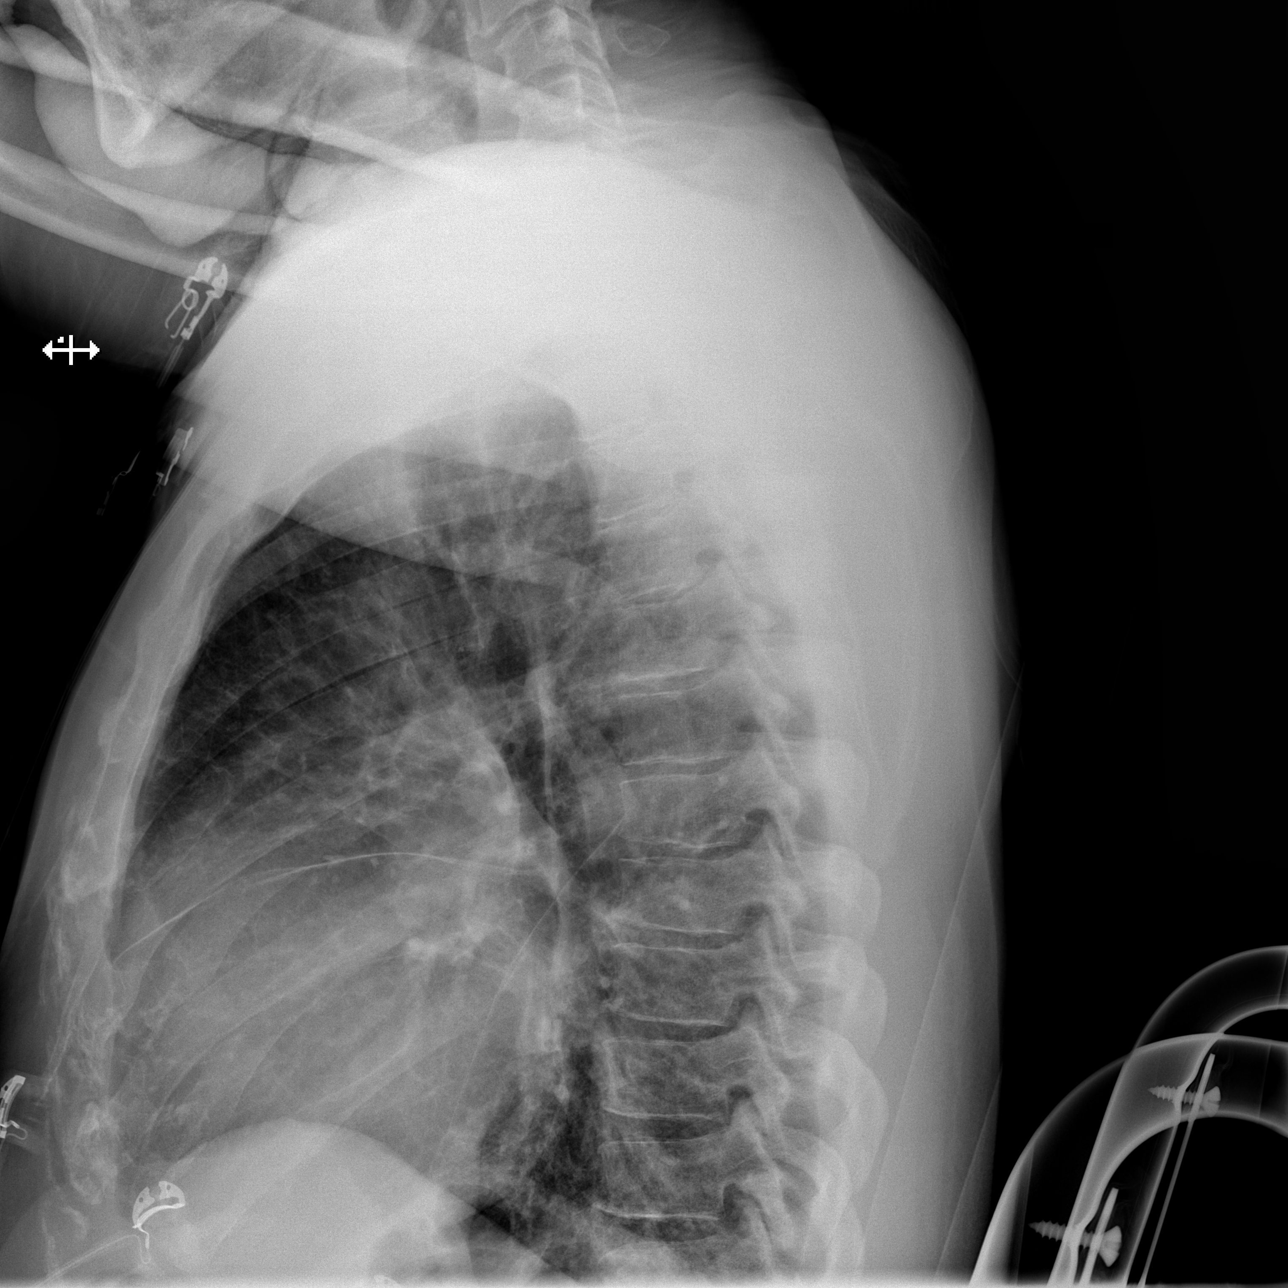

[w chest lat (2 of 2)]
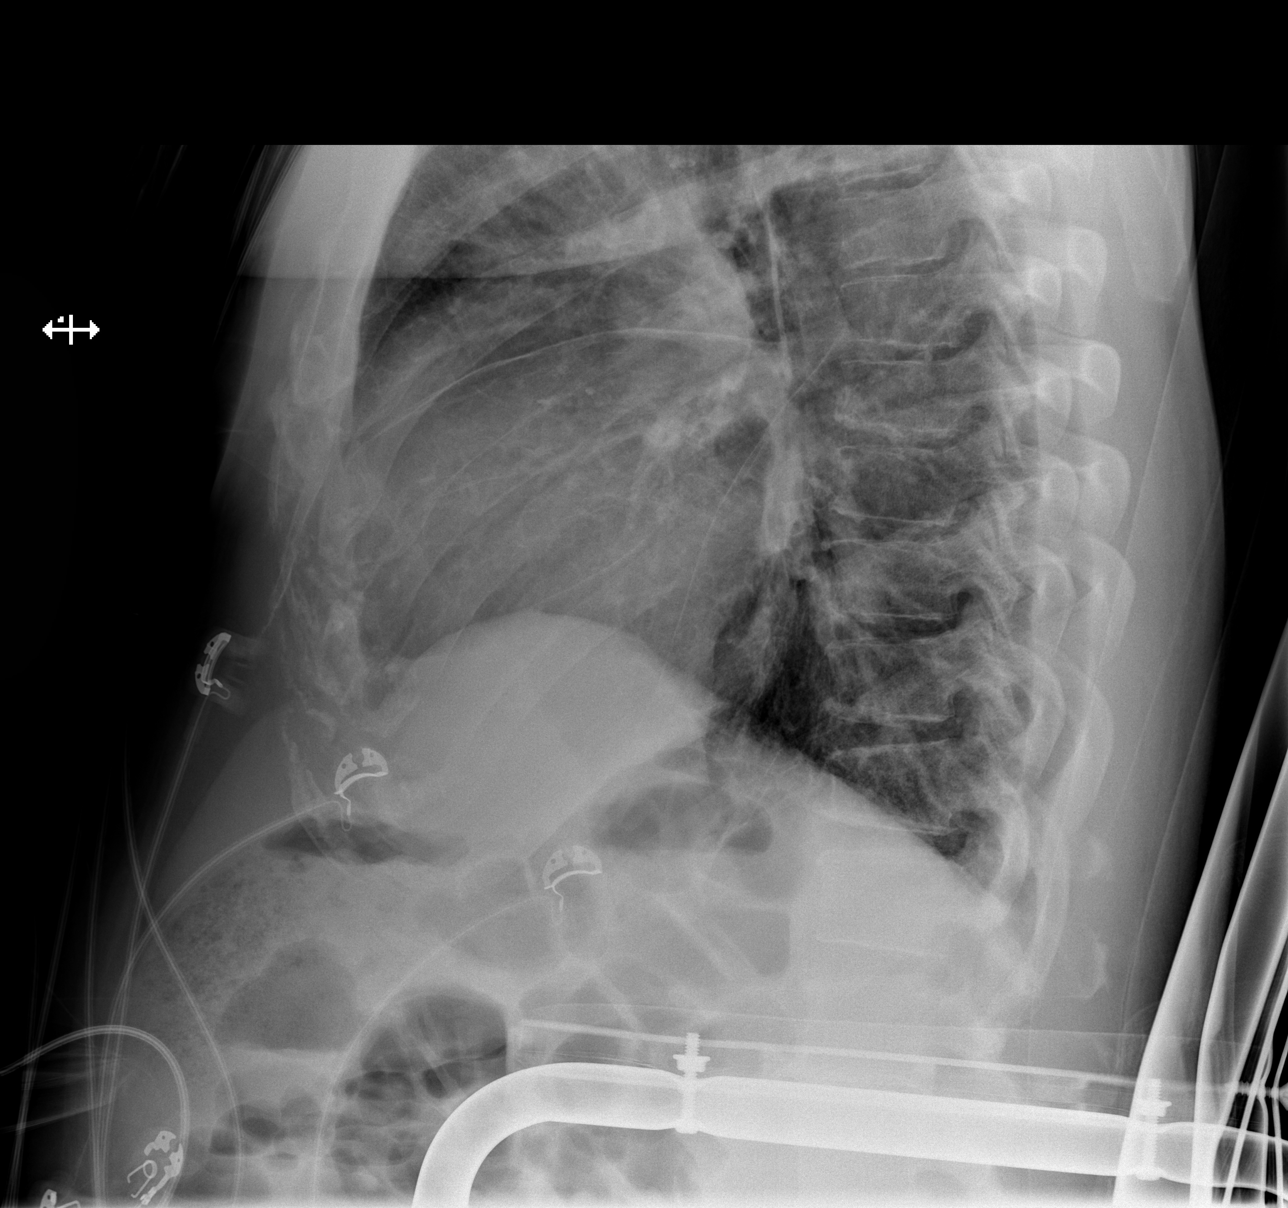

[x chest ap]
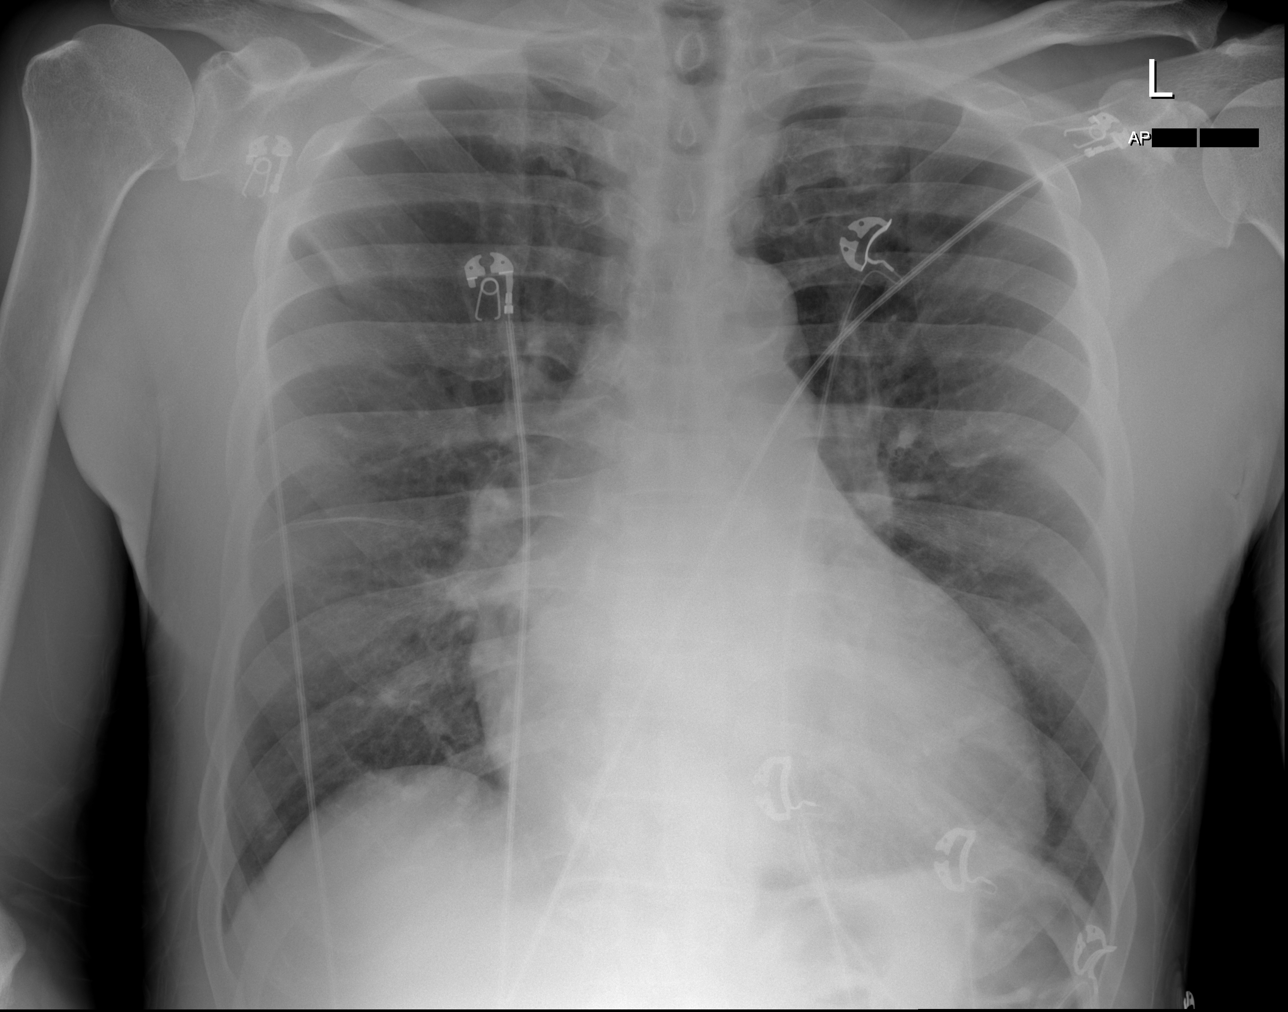

[3 of 3 positions shown; findings below may reference images not displayed]

FINDINGS: Transverse diameter of heart is increased. Central pulmonary vessels
are less prominent. There are no signs of pulmonary edema or focal
pulmonary consolidation. There is no pleural effusion or
pneumothorax.
IMPRESSION: Cardiomegaly. There are no signs of alveolar pulmonary edema or
focal pulmonary consolidation.

## 2022-02-26 MED ORDER — LORAZEPAM 1 MG PO TABS
1.0000 mg | ORAL_TABLET | ORAL | Status: AC | PRN
  Administered 2022-02-27: 2 mg via ORAL
  Administered 2022-02-27 (×2): 1 mg via ORAL
  Filled 2022-02-26: qty 2
  Filled 2022-02-26 (×3): qty 1

## 2022-02-26 MED ORDER — THIAMINE HCL 100 MG PO TABS
100.0000 mg | ORAL_TABLET | Freq: Every day | ORAL | Status: DC
  Administered 2022-02-26 – 2022-03-07 (×7): 100 mg via ORAL
  Filled 2022-02-26 (×9): qty 1

## 2022-02-26 MED ORDER — LORAZEPAM 2 MG/ML IJ SOLN: 1.0000 mg | INTRAMUSCULAR | Status: AC | PRN

## 2022-02-26 MED ORDER — LEVETIRACETAM 500 MG PO TABS
500.0000 mg | ORAL_TABLET | Freq: Two times a day (BID) | ORAL | Status: DC
  Administered 2022-02-26 – 2022-03-07 (×17): 500 mg via ORAL
  Filled 2022-02-26 (×19): qty 1

## 2022-02-26 MED ORDER — HYDRALAZINE HCL 20 MG/ML IJ SOLN: 10.0000 mg | Freq: Three times a day (TID) | INTRAMUSCULAR | Status: DC | PRN

## 2022-02-26 MED ORDER — ALBUTEROL SULFATE HFA 108 (90 BASE) MCG/ACT IN AERS
2.0000 | INHALATION_SPRAY | Freq: Once | RESPIRATORY_TRACT | Status: AC
  Administered 2022-02-26: 2 via RESPIRATORY_TRACT
  Filled 2022-02-26: qty 6.7

## 2022-02-26 MED ORDER — FOLIC ACID 1 MG PO TABS
1.0000 mg | ORAL_TABLET | Freq: Every day | ORAL | Status: DC
  Administered 2022-02-26 – 2022-03-07 (×8): 1 mg via ORAL
  Filled 2022-02-26 (×9): qty 1

## 2022-02-26 MED ORDER — OXYCODONE HCL 5 MG PO TABS
10.0000 mg | ORAL_TABLET | Freq: Once | ORAL | Status: AC
  Administered 2022-02-26: 10 mg via ORAL
  Filled 2022-02-26: qty 2

## 2022-02-26 MED ORDER — FUROSEMIDE 10 MG/ML IJ SOLN
40.0000 mg | Freq: Once | INTRAMUSCULAR | Status: AC
  Administered 2022-02-26: 40 mg via INTRAVENOUS
  Filled 2022-02-26: qty 4

## 2022-02-26 MED ORDER — UMECLIDINIUM BROMIDE 62.5 MCG/ACT IN AEPB
1.0000 | INHALATION_SPRAY | Freq: Every day | RESPIRATORY_TRACT | Status: DC
  Administered 2022-02-27: 1 via RESPIRATORY_TRACT
  Filled 2022-02-26: qty 7

## 2022-02-26 MED ORDER — FUROSEMIDE 10 MG/ML IJ SOLN
40.0000 mg | Freq: Two times a day (BID) | INTRAMUSCULAR | Status: DC
  Filled 2022-02-26 (×2): qty 4

## 2022-02-26 MED ORDER — OXYCODONE HCL 5 MG PO TABS
5.0000 mg | ORAL_TABLET | ORAL | Status: DC | PRN
  Administered 2022-02-26 – 2022-02-27 (×7): 5 mg via ORAL
  Filled 2022-02-26 (×9): qty 1

## 2022-02-26 MED ORDER — THIAMINE HCL 100 MG/ML IJ SOLN
100.0000 mg | Freq: Every day | INTRAMUSCULAR | Status: DC
  Administered 2022-03-03: 100 mg via INTRAVENOUS
  Filled 2022-02-26: qty 2

## 2022-02-26 MED ORDER — ALBUTEROL SULFATE (2.5 MG/3ML) 0.083% IN NEBU
2.5000 mg | INHALATION_SOLUTION | Freq: Four times a day (QID) | RESPIRATORY_TRACT | Status: DC | PRN
  Administered 2022-02-26 – 2022-02-27 (×2): 2.5 mg via RESPIRATORY_TRACT
  Filled 2022-02-26 (×2): qty 3

## 2022-02-26 MED ORDER — ENOXAPARIN SODIUM 80 MG/0.8ML IJ SOSY
80.0000 mg | PREFILLED_SYRINGE | INTRAMUSCULAR | Status: DC
  Filled 2022-02-26: qty 0.8

## 2022-02-26 MED ORDER — ALBUTEROL SULFATE HFA 108 (90 BASE) MCG/ACT IN AERS: 2.0000 | INHALATION_SPRAY | Freq: Four times a day (QID) | RESPIRATORY_TRACT | Status: DC | PRN

## 2022-02-26 MED ORDER — ADULT MULTIVITAMIN W/MINERALS CH
1.0000 | ORAL_TABLET | Freq: Every day | ORAL | Status: DC
  Administered 2022-02-26 – 2022-03-07 (×8): 1 via ORAL
  Filled 2022-02-26 (×9): qty 1

## 2022-02-26 NOTE — Progress Notes (Signed)
There is no TOC consult however received a heads up from EDP re: this patient who is known to the [facility]. This RNCM spoke with Philipp Deputy who is a SW at [employer] who states she has been attempting to assist the patient with getting ALF or SNF. Hilda Blades reports the patient's current problems: wound issues, swelling, bleeding, and difficulty breathing. 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 Patient had a cardiology appointment on 02/25/22 which was rescheduled to  02/27/22 due to issues with medical transportation.  ?

## 2022-02-26 NOTE — ED Provider Notes (Signed)
?[facility] DEPT ?Provider Note ? ? ?CSN: [id number] ?Arrival date & time: 02/26/22  1140 ? ?  ? ?History ? ?Chief Complaint  ?Patient presents with  ? Leg Pain  ? ? ?[name] is a 56 y.o. male.  Presents to the emergency department with concern for leg swelling.  Patient reports that over the past few days he has noted progressive worsening of his leg swelling, worse in both legs, swelling extends up to his thighs.  Has difficulty getting his pants off because of how tight they are now on his legs.  States that he also is having increased shortness of breath, breathing is worse with lying flat.  Worse with exertion.  Has not been taking any medications for his heart lately. ? ?Completed extensive chart review, reviewed recent office visit with wound care.  They feel his wound ulcers are stable however due to the increasing swelling recommended he come to ER for evaluation. ? ? ? ?HPI ? ?  ? ?Home Medications ?Prior to Admission medications   ?Medication Sig Start Date End Date Taking? Authorizing Provider  ?albuterol (VENTOLIN HFA) 108 (90 Base) MCG/ACT inhaler Inhale 2 puffs into the lungs every 6 (six) hours as needed for wheezing or shortness of breath. ?Patient not taking: Reported on 01/31/2022 11/09/21   Wyvonnia Dusky, MD  ?atorvastatin (LIPITOR) 10 MG tablet Take 4 tablets (40 mg total) by mouth daily. ?Patient not taking: Reported on 01/31/2022 11/09/21   Wyvonnia Dusky, MD  ?carvedilol (COREG) 6.25 MG tablet Take 1 tablet (6.25 mg total) by mouth 2 (two) times daily with a meal. ?Patient not taking: Reported on 01/29/2022 12/09/21   [name], MD  ?levETIRAcetam (KEPPRA) 500 MG tablet Take 1 tablet (500 mg total) by mouth 2 (two) times daily. ?Patient not taking: Reported on 01/31/2022 11/09/21 04/05/22  Wyvonnia Dusky, MD  ?OLANZapine (ZYPREXA) 5 MG tablet Take 1 tablet (5 mg total) by mouth daily. ?Patient not taking: Reported on 12/03/2021 11/25/21   Ethelene Hal, NP  ?oxyCODONE (OXY IR/ROXICODONE) 5 MG immediate release tablet Take 1 tablet (5 mg total) by mouth every 6 (six) hours as needed for severe pain or breakthrough pain. 12/09/21   [name], MD  ?spironolactone (ALDACTONE) 25 MG tablet Take 1 tablet (25 mg total) by mouth daily. ?Patient not taking: Reported on 01/31/2022 12/09/21   [name], MD  ?umeclidinium bromide (INCRUSE ELLIPTA) 62.5 MCG/INH AEPB Inhale 1 puff into the lungs daily. ?Patient not taking: Reported on 11/24/2021 11/16/20   Flora Lipps, MD  ?   ? ?Allergies    ?Ibuprofen, Acetaminophen, Lyrica cr [pregabalin er], and Peanut-containing drug products   ? ?Review of Systems   ?Review of Systems  ?Constitutional:  Negative for chills and fever.  ?HENT:  Negative for ear pain and sore throat.   ?Eyes:  Negative for pain and visual disturbance.  ?Respiratory:  Positive for shortness of breath. Negative for cough.   ?Cardiovascular:  Negative for chest pain and palpitations.  ?Gastrointestinal:  Negative for abdominal pain and vomiting.  ?Genitourinary:  Negative for dysuria and hematuria.  ?Musculoskeletal:  Positive for arthralgias, joint swelling and myalgias. Negative for back pain.  ?Skin:  Negative for color change and rash.  ?Neurological:  Negative for seizures and syncope.  ?All other systems reviewed and are negative. ? ?Physical Exam ?Updated Vital Signs ?BP (!) 141/102   Pulse 97   Temp 98.5 ?F (36.9 ?C) (Oral)  Resp 20   SpO2 99%  ?Physical Exam ?Vitals and nursing note reviewed.  ?Constitutional:   ?   General: He is not in acute distress. ?   Appearance: He is well-developed.  ?HENT:  ?   Head: Normocephalic and atraumatic.  ?Eyes:  ?   Conjunctiva/sclera: Conjunctivae normal.  ?Cardiovascular:  ?   Rate and Rhythm: Normal rate and regular rhythm.  ?   Heart sounds: No murmur heard. ?Pulmonary:  ?   Effort: Pulmonary effort is normal. No respiratory distress.  ?   Breath sounds: Normal breath sounds.  ?Abdominal:  ?    Palpations: Abdomen is soft.  ?   Tenderness: There is no abdominal tenderness.  ?Musculoskeletal:     ?   General: No swelling.  ?   Cervical back: Neck supple.  ?   Comments: Right lower extremity: There is pitting edema in lower leg, lower thigh;  ?Left lower extremity: There is pitting edema in lower leg, lower thigh; s/p great toe amputation  ?Skin: ?   General: Skin is warm and dry.  ?   Capillary Refill: Capillary refill takes less than 2 seconds.  ?Neurological:  ?   Mental Status: He is alert.  ?Psychiatric:     ?   Mood and Affect: Mood normal.  ? ? ?ED Results / Procedures / Treatments   ?Labs ?(all labs ordered are listed, but only abnormal results are displayed) ?Labs Reviewed  ?CBC WITH DIFFERENTIAL/PLATELET - Abnormal; Notable for the following components:  ?    Result Value  ? Platelets 413 (*)   ? All other components within normal limits  ?BASIC METABOLIC PANEL - Abnormal; Notable for the following components:  ? CO2 21 (*)   ? Calcium 8.5 (*)   ? All other components within normal limits  ?BRAIN NATRIURETIC PEPTIDE - Abnormal; Notable for the following components:  ? B Natriuretic Peptide 1,573.5 (*)   ? All other components within normal limits  ?TROPONIN I (HIGH SENSITIVITY) - Abnormal; Notable for the following components:  ? Troponin I (High Sensitivity) 64 (*)   ? All other components within normal limits  ?RESP PANEL BY RT-PCR (FLU A&B, COVID) [id number]  ?TROPONIN I (HIGH SENSITIVITY)  ? ? ?EKG ?EKG Interpretation ? ?Date/Time:  Wednesday February 26 2022 13:50:30 EDT ?Ventricular Rate:  102 ?PR Interval:  171 ?QRS Duration: 98 ?QT Interval:  368 ?QTC Calculation: 480 ?R Axis:   -44 ?Text Interpretation: Sinus tachycardia Left atrial enlargement Left anterior fascicular block Anteroseptal infarct, age indeterminate Lateral leads are also involved Confirmed by Madalyn Rob [phone]) on 02/26/2022 3:43:04 PM ? ?Radiology ?DG Chest 2 View ? ?Result Date: 02/26/2022 ?CLINICAL DATA:  Shortness of  breath EXAM: CHEST - 2 VIEW COMPARISON:  02/02/2022 FINDINGS: Transverse diameter of heart is increased. Central pulmonary vessels are less prominent. There are no signs of pulmonary edema or focal pulmonary consolidation. There is no pleural effusion or pneumothorax. IMPRESSION: Cardiomegaly. There are no signs of alveolar pulmonary edema or focal pulmonary consolidation. Electronically Signed   By: Elmer Picker M.D.   On: 02/26/2022 14:18   ? ?Procedures ?Procedures  ? ? ?Medications Ordered in ED ?Medications  ?oxyCODONE (Oxy IR/ROXICODONE) immediate release tablet 10 mg (10 mg Oral Given 02/26/22 1337)  ?albuterol (VENTOLIN HFA) 108 (90 Base) MCG/ACT inhaler 2 puff (2 puffs Inhalation Given 02/26/22 1358)  ?furosemide (LASIX) injection 40 mg (40 mg Intravenous Given 02/26/22 1525)  ? ? ?ED Course/ Medical Decision Making/ A&P ?  ?                        ?  Medical Decision Making ?Amount and/or Complexity of Data Reviewed ?Labs: ordered. ?Radiology: ordered. ? ?Risk ?Prescription drug management. ?Decision regarding hospitalization. ? ? ?56 year old gentleman presenting to the emergency room with concern for leg swelling, shortness of breath.  Has notable medical history of heart failure with severely reduced ejection fraction (less than 20%) as well as diabetes, bilateral chronic foot ulcers.  Seen in wound care this morning, per note, wounds were stable in appearance but leg swelling profoundly worse than normal.  Patient does have pitting edema extending into his thighs on both sides.  His legs do appear acutely more swollen than when I have seen patient previously.  Patient is also complaining of some shortness of breath.  His BNP is profoundly elevated from baseline.  Suspect most likely heart failure exacerbation.  Given patient's overall clinical picture, feel he would benefit from admission for more aggressive IV diuresis.  Discussed the case with Dr. Marylyn Ishihara with the hospitalist service who agrees to  evaluate and admit patient. ? ?EKG without acute ischemic change from prior, troponin mildly elevated but patient denying any chest pain, doubt ACS. ? ?I independently reviewed the CXR. ? ?Additional history obtained

## 2022-02-26 NOTE — Progress Notes (Addendum)
Patients BP was 125/101. MD notified. Patient refused his evening lovenox and lasix. MD notified of patient's refusal. MD informed that patient does not like to be touched so evening assessment done to the best of my ability with what the patient would allow. ?

## 2022-02-26 NOTE — Progress Notes (Signed)
Patient requested to speak to his nurse stating his pain medication is not working. Upon observing, his symptoms are not correlating. When I am not in the room he is completely quiet, but when he knows someone is nearby or is in his room, he begins acting like he is in pain. It seems like he is using drug seeking behavior because his is showing signs that his prn is working, as he is quiet and showing no visible changes or signs os distress when I am not nearby. MD notified of this behavior. ?

## 2022-02-26 NOTE — Progress Notes (Addendum)
S[place], [name] ([id number]) ?Visit Report for 02/26/2022 ?Chief Complaint Document Details ?Patient Name: Date of Service: ?[name], [name] 02/26/2022 10:15 A M ?Medical Record Number: [id number] ?Patient Account Number: [id number] ?Date of Birth/Sex: Treating RN: ?07/12/1966 (56 y.o. M) ?Primary Care Provider: PCP, NO Other Clinician: ?Referring Provider: ?Treating Provider/Extender: Lenda Kelp ?Weeks in Treatment: 8 ?Information Obtained from: Patient ?Chief Complaint ?Bilateral LE Ulcers ?Electronic Signature(s) ?Signed: 02/26/2022 10:54:21 AM By: Lenda Kelp PA-C ?Entered By: Lenda Kelp on 02/26/2022 10:54:20 ?-------------------------------------------------------------------------------- ?HPI Details ?Patient Name: Date of Service: ?[name], [name] 02/26/2022 10:15 A M ?Medical Record Number: [id number] ?Patient Account Number: [id number] ?Date of Birth/Sex: Treating RN: ?10/08/1966 (56 y.o. M) ?Primary Care Provider: PCP, NO Other Clinician: ?Referring Provider: ?Treating Provider/Extender: Lenda Kelp ?Weeks in Treatment: 8 ?History of Present Illness ?HPI Description: 01/01/2022 upon evaluation today patient is a pleasant gentleman who presents today for evaluation here in the clinic initially. Subsequently he ?is having a lot of issues here with wounds on his bilateral lower extremities that appear to be for the most part venous in nature. I do not see any signs of ?infection currently although he has had cellulitis a couple times. He does have several contributing factors to this he has good arterial flow I did review his ?arterial study which showed an ABI on the right of 1.03 and on the left of 1.02 this was May 2022. Previous that his right ABI was 1.15 and left 1.07. He seems ?to have good blood flow today as well and I see no changes or shifts here to be perfectly honest. That is good news. Nonetheless he does have some chronic ?medical conditions that are affecting the situation namely his  congestive heart failure I will detail this right below. ?Patient does have a history of diabetes mellitus type 2 although his A1c is 5.8 on most recent check and seems to be doing okay he has not taken any ?medication at this point though previously he was on metformin. He also has a history of congestive heart failure with ejection fraction of 25%, COPD, and ?hypertension. ?Of note also the patient is currently homeless he is sleeping outside in a sleeping bag. This is definitely not a common complaint as far as his treatment plan is ?concerned. He did have Medicaid in [place] but recently moved to [place] area. His Medicaid has not flipped over yet although he does have a ?Child psychotherapist with him today who is working on that aspect. ?01/08/2022 upon evaluation today patient appears to be doing decently well in regard to his wound. He has been tolerating the dressing changes without ?complication. He did have to go to the hospital to have his wraps removed last week as a starting to hurt I think it may have been that the dressings were ?getting somewhat dry. We will see what I can do today to try to prevent that from happening. ?01/22/2022 upon evaluation today patient appears to be doing much better in regard to his wounds. He is actually making excellent progress. Fortunately I do ?not see any signs of active infection locally or systemically which is great news. No fevers, chills, nausea, vomiting, or diarrhea. ?02/26/2022 upon evaluation today patient unfortunately appears to be doing worse its been since February 22 that I last saw him that has been almost 5 weeks. ?With that being said he does seem to be doing significantly worse compared to previous. Fortunately I do not see any signs of  active infection locally or ?systemically at this time which is great news. No fevers, chills, nausea, vomiting, or diarrhea. Nonetheless he tells me that he has been having a lot of trouble ?with shortness of  breath. He tells me that his sleeping has been difficult. He is homeless and unfortunately just is not as healthy as what would like to see. 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 With that being said he is significantly swollen in his legs even compared to ?what I have seen before coupled with the shortness of breath I am concerned that his heart failure may be worsening I have concerns that he probably needs ?to go to the ER for further evaluation. ?Electronic Signature(s) ?Signed: 02/26/2022 11:20:38 AM By: Lenda Kelp PA-C ?Entered By: Lenda Kelp on 02/26/2022 11:20:38 ?-------------------------------------------------------------------------------- ?Physical Exam Details ?Patient Name: Date of Service: ?[name], [name] 02/26/2022 10:15 A M ?Medical Record Number: [id number] ?Patient Account Number: [id number] ?Date of Birth/Sex: Treating RN: ?07-Aug-1966 (56 y.o. M) ?Primary Care Provider: PCP, NO Other Clinician: ?Referring Provider: ?Treating Provider/Extender: Lenda Kelp ?Weeks in Treatment: 8 ?Constitutional ?Well-nourished and well-hydrated in no acute distress. ?Respiratory ?normal breathing without difficulty. ?Psychiatric ?this patient is able to make decisions and demonstrates good insight into disease process. Alert and Oriented x 3. pleasant and cooperative. ?Notes ?Upon inspection patient's wound bed actually showed signs of doing significantly worse mainly due to the fact in my opinion that his legs are really swollen is ?also not really being cared for properly. The greater issue however at this point in my opinion is simply that he is having significant issues with leg swelling but ?also with breathing. I think this is potentially an indication that his congestive heart failure may be out of control. I think he does need to be seen as soon as ?possible and I think the ER would probably be the best way for him to go at this point to check this  out and ensure that he is not in the midst of a significant ?congestive heart failure exacerbation. ?Electronic Signature(s) ?Signed: 02/26/2022 11:21:21 AM By: Lenda Kelp PA-C ?Entered By: Lenda Kelp on 02/26/2022 11:21:20 ?-------------------------------------------------------------------------------- ?Physician Orders Details ?Patient Name: Date of Service: ?[name], [name] 02/26/2022 10:15 A M ?Medical Record Number: [id number] ?Patient Account Number: [id number] ?Date of Birth/Sex: Treating RN: ?[birth year] (56 y.o. Lytle Michaels ?Primary Care Provider: PCP, NO Other Clinician: ?Referring Provider: ?Treating Provider/Extender: Lenda Kelp ?Weeks in Treatment: 8 ?Verbal / Phone Orders: No ?Diagnosis Coding ?ICD-10 Coding ?Code Description ?E11.621 Type 2 diabetes mellitus with foot ulcer ?L97.512 Non-pressure chronic ulcer of other part of right foot with fat layer exposed ?L97.312 Non-pressure chronic ulcer of right ankle with fat layer exposed ?E09.233 Non-pressure chronic ulcer of other part of left lower leg with fat layer exposed ?I50.42 Chronic combined systolic (congestive) and diastolic (congestive) heart failure ?J44.9 Chronic obstructive pulmonary disease, unspecified ?I10 Essential (primary) hypertension ?Follow-up Appointments ?ppointment in 1 week. - Wednesday with Leonard Schwartz, PA ?Return A ?**Go to ED for evaluation of CHF exacerbation** ?Other: - Pain clinic appt 03/18/22 ?***Patient to purchase compression stockings. leg measurements provided. bring in next week.*** ?Bathing/ Shower/ Hygiene ?May shower with protection but do not get wound dressing(s) wet. ?Edema Control - Lymphedema / SCD / Other ?Elevate legs to the level of the heart or above for 30 minutes daily and/or when sitting, a frequency of: - 3-4 times  a day throughout the day. ?Avoid standing for long periods of time. ?Exercise regularly ?Other Edema Control Orders/Instructions: - apply lotion with TCA cream, kerlix and ace  wrap to left foot and leg. ?Additional Orders / Instructions ?Stop/Decrease Smoking ?Follow Nutritious Diet ?Wound Treatment ?Wound #1 - Lower Leg Wound Laterality: Right, Posterior ?Cleanser: Soap and Wa

## 2022-02-26 NOTE — ED Triage Notes (Signed)
Pt c/o bilateral leg pain. Refer to previous notes in chart.  ? ?Pt in wheelchair.  ?

## 2022-02-26 NOTE — Telephone Encounter (Signed)
[name] received a call transferred from [facility] answering service from Kelli Hope, [employer] Social Work intern at [employer]. She shares that there was an issue with transportation that occurred yesterday and pt was unable to get to appt. He is rescheduled for tomorrow and she will call Medicaid transportation to see if she can schedule one for tomorrow. She is adamant that pt needs to go to an ALF and requests an FL2 and PASRR. [name] shared that I was aware she had called our office and requested that at the beginning of the week. I reviewed that request with our clinic leadership at that time and noted that an FL2 is already on file from recent hospital stay and PASRR valid on [facility] through 03/08/22.  ? ?I shared that the [facility] encompasses an evaluation of function in multiple areas that would be best completed by PCP rather than specialty provider. Especially, given pt extensive mental health hx and wound care needs. SW intern and pt can request FL2 from medical records if needed and locate PASRR on [facility] system. I provided her with that information to obtain. Pt has an upcoming appt w/ [facility] on 4/10 per her report.  ? ?SW Intern also asking if pt could go to ED to get an inhaler. I shared that if pt in distress and having ongoing challenges with breathing then he should go to ED for evaluation. If he needs an inhaler prescribed to him and is in no acute distress then he should see if urgent care or a virtual visit could possibly be arranged. Intern states understanding.  ? ?Octavio Graves, MSW, [name] ?Clinical Social Worker II ?[facility]  ?[phone]- work cell phone (preferred) ?[phone]- desk phone ? ?

## 2022-02-26 NOTE — Progress Notes (Signed)
[place], [name] ([id number]) ?Visit Report for 02/26/2022 ?Arrival Information Details ?Patient Name: Date of Service: ?WILLIA MS, CO REY 02/26/2022 10:15 A M ?Medical Record Number: [id number] ?Patient Account Number: [id number] ?Date of Birth/Sex: Treating RN: ?September 29, 1966 (56 y.o. Lytle Michaels ?Primary Care Geoffrey Hynes: PCP, NO Other Clinician: ?Referring Artie Mcintyre: ?Treating [name]/Extender: Lenda Kelp ?Weeks in Treatment: 8 ?Visit Information History Since Last Visit ?Added or deleted any medications: No ?Patient Arrived: Wheel Chair ?Any new allergies or adverse reactions: No ?Arrival Time: 10:31 ?Had a fall or experienced change in No ?Transfer Assistance: Manual ?activities of daily living that may affect ?Patient Identification Verified: Yes ?risk of falls: ?Secondary Verification Process Completed: Yes ?Signs or symptoms of abuse/neglect since last visito No ?Patient Requires Transmission-Based No ?Hospitalized since last visit: No ?Precautions: ?Implantable device outside of the clinic excluding No ?Patient Has Alerts: Yes ?cellular tissue based products placed in the center ?Patient Alerts: ?04/23/21 ABI: L 1.02 ?since last visit: ?R1.03 ?Has Dressing in Place as Prescribed: Yes ?Has Compression in Place as Prescribed: Yes ?Pain Present Now: Yes ?Electronic Signature(s) ?Signed: 02/26/2022 4:27:37 PM By: Antonieta Iba ?Entered By: Antonieta Iba on 02/26/2022 10:32:03 ?-------------------------------------------------------------------------------- ?Clinic Level of Care Assessment Details ?Patient Name: Date of Service: ?WILLIA MS, CO REY 02/26/2022 10:15 A M ?Medical Record Number: [id number] ?Patient Account Number: [id number] ?Date of Birth/Sex: Treating RN: ?1966/03/19 (56 y.o. Lytle Michaels ?Primary Care Lauri Till: PCP, NO Other Clinician: ?Referring Jocelyne Reinertsen: ?Treating Afrika Brick/Extender: Lenda Kelp ?Weeks in Treatment: 8 ?Clinic Level of Care Assessment Items ?TOOL 4 Quantity Score ?X- 1 0 ?Use  when only an EandM is performed on FOLLOW-UP visit ?ASSESSMENTS - Nursing Assessment / Reassessment ?X- 1 10 ?Reassessment of Co-morbidities (includes updates in patient status) ?X- 1 5 ?Reassessment of Adherence to Treatment Plan ?ASSESSMENTS - Wound and Skin A ssessment / Reassessment ?[]  - 0 ?Simple Wound Assessment / Reassessment - one wound ?X- 3 5 ?Complex Wound Assessment / Reassessment - multiple wounds ?[]  - 0 ?Dermatologic / Skin Assessment (not related to wound area) ?ASSESSMENTS - Focused Assessment ?[]  - 0 ?Circumferential Edema Measurements - multi extremities ?[]  - 0 ?Nutritional Assessment / Counseling / Intervention ?[]  - 0 ?Lower Extremity Assessment (monofilament, tuning fork, pulses) ?[]  - 0 ?Peripheral Arterial Disease Assessment (using hand held doppler) ?ASSESSMENTS - Ostomy and/or Continence Assessment and Care ?[]  - 0 ?Incontinence Assessment and Management ?[]  - 0 ?Ostomy Care Assessment and Management (repouching, etc.) ?PROCESS - Coordination of Care ?[]  - 0 ?Simple Patient / Family Education for ongoing care ?X- 1 20 ?Complex (extensive) Patient / Family Education for ongoing care ?X- 1 10 ?Staff obtains Consents, Records, T Results / Process Orders ?est ?[]  - 0 ?Staff telephones HHA, Nursing Homes / Clarify orders / etc ?[]  - 0 ?Routine Transfer to another Facility (non-emergent condition) ?X- 1 10 ?Routine Hospital Admission (non-emergent condition) ?[]  - 0 ?New Admissions / / Ordering NPWT Apligraf, etc. ?, ?[]  - 0 ?Emergency Hospital Admission (emergent condition) ?[]  - 0 ?Simple Discharge Coordination ?[]  - 0 ?Complex (extensive) Discharge Coordination ?PROCESS - Special Needs ?[]  - 0 ?Pediatric / Minor Patient Management ?[]  - 0 ?Isolation Patient Management ?[]  - 0 ?Hearing / Language / Visual special needs ?[]  - 0 ?Assessment of Community assistance (transportation, D/C planning, etc.) ?[]  - 0 ?Additional assistance / Altered mentation ?[]  - 0 ?Support  Surface(s) Assessment (bed, cushion, seat, etc.) ?INTERVENTIONS - Wound Cleansing / Measurement ?[]  - 0 ?Simple Wound Cleansing - one wound ?X- 3 5 ?  Complex Wound Cleansing - multiple wounds ?X- 1 5 ?Wound Imaging (photographs - any number of wounds) ?[]  - 0 ?Wound Tracing (instead of photographs) ?[]  - 0 ?Simple Wound Measurement - one wound ?X- 3 5 ?Complex Wound Measurement - multiple wounds ?INTERVENTIONS - Wound Dressings ?[]  - 0 ?Small Wound Dressing one or multiple wounds ?[]  - 0 ?Medium Wound Dressing one or multiple wounds ?[]  - 0 ?Large Wound Dressing one or multiple wounds ?[]  - 0 ?Application of Medications - topical ?[]  - 0 ?Application of Medications - injection ?INTERVENTIONS - Miscellaneous ?[]  - 0 ?External ear exam ?[]  - 0 ?Specimen Collection (cultures, biopsies, blood, body fluids, etc.) ?[]  - 0 ?Specimen(s) / Culture(s) sent or taken to Lab for analysis ?[]  - 0 ?Patient Transfer (multiple staff / / Similar devices) ?[]  - 0 ?Simple Staple / Suture removal (25 or less) ?[]  - 0 ?Complex Staple / Suture removal (26 or more) ?[]  - 0 ?Hypo / Hyperglycemic Management (close monitor of Blood Glucose) ?[]  - 0 ?Ankle / Brachial Index (ABI) - do not check if billed separately ?X- 1 5 ?Vital Signs ?Has the patient been seen at the hospital within the last three years: Yes ?Total Score: 110 ?Level Of Care: New/Established - Level 3 ?Electronic Signature(s) ?Signed: 02/26/2022 4:27:37 PM By: ?Entered By: on 02/26/2022 12:16:43 ?-------------------------------------------------------------------------------- ?Encounter Discharge Information Details ?Patient Name: Date of Service: ?WILLIA MS, CO REY 02/26/2022 10:15 A M ?Medical Record Number: ?Patient Account Number: ?Date of Birth/Sex: Treating RN: ?1965-12-14 (56 y.o. ?Primary Care Avayah Raffety: PCP, NO Other Clinician: ?Referring Breslyn Abdo: ?Treating Chriselda Leppert/Extender: Nurse, adult ?Weeks in Treatment: 8 ?Encounter Discharge Information Items ?Discharge Condition: Stable ?Ambulatory Status: Wheelchair ?Discharge Destination: Emergency Room ?Orders Sent: Yes ?Transportation: Other ?Accompanied By: Wound care staff member ?Schedule Follow-up Appointment: Yes ?Clinical Summary of Care: Provided on 02/26/2022 ?Form Type Recipient ?Paper Patient Patient ?Electronic Signature(s) ?Signed: 02/26/2022 12:19:01 PM By: ?Entered By: on 02/26/2022 12:19:01 ?-------------------------------------------------------------------------------- ?Lower Extremity Assessment Details ?Patient Name: Date of Service: ?WILLIA MS, CO REY 02/26/2022 10:15 A M ?Medical Record Number: Antonieta Iba ?Patient Account Number: 02/28/2022 ?Date of Birth/Sex: Treating RN: ?1966/01/14 (56 y.o. [id number] ?Primary Care Trinette Vera: PCP, NO Other Clinician: ?Referring Juris Gosnell: ?Treating Andrey Mccaskill/Extender: 04/30/1966 ?Weeks in Treatment: 8 ?Edema Assessment ?Assessed: [Left: No] [Right: Yes] ?Edema: [Left: Ye] [Right: s] ?Calf ?Left: Right: ?Point of Measurement: 36 cm From Medial Instep 45.5 cm ?Ankle ?Left: Right: ?Point of Measurement: 13 cm From Medial Instep 27 cm ?Vascular Assessment ?Pulses: ?Dorsalis Pedis ?Palpable: [Right:Yes] ?Electronic Signature(s) ?Signed: 02/26/2022 4:27:37 PM By: Lytle Michaels ?Entered By: Lenda Kelp on 02/26/2022 10:41:54 ?-------------------------------------------------------------------------------- ?Multi-Disciplinary Care Plan Details ?Patient Name: ?Date of Service: ?WILLIA MS, CO REY 02/26/2022 10:15 A M ?Medical Record Number: Antonieta Iba ?Patient Account Number: Antonieta Iba ?Date of Birth/Sex: ?Treating RN: ?08/24/1966 (56 y.o. [id number] ?Primary Care Edu On: PCP, NO ?Other Clinician: ?Referring Tamaira Ciriello: ?Treating Kaion Tisdale/Extender: [phone] ?Weeks in Treatment: 8 ?Multidisciplinary Care Plan reviewed with physician ?Active Inactive ?Pain,  Acute or Chronic ?Nursing Diagnoses: ?Pain, acute or chronic: actual or potential ?Potential alteration in comfort, pain ?Goals: ?Patient will verbalize adequate pain control and receive pain control intervent

## 2022-02-26 NOTE — Progress Notes (Signed)
Pt arrived in wheelchair, didn't want to transfer to bed at the moment. Pt was assisted ordering dinner and set up to wash up himself and to leave him alone while washing up by the sink per his request. Instructed pt to call when his done. NT & this RN went to his room to check on him, get vitals & to place on telemetry. Pt became rude & uncooperative, request staff to leave his room. CN aware and will talk to pt. ?

## 2022-02-26 NOTE — Progress Notes (Signed)
Department leadership made aware that patient wanted to see them. Per CN patient fired current Charity fundraiser and NT because he wanted a "nurse that speaks english". Patient also upset that he did not get macaroni and cheese with his dinner.  ?From previous admissions a Patient and Care Team Partnership Agreement was established to support behaviors that will have a positive impact on patients plan of care. Expectations and consequences of patient behavior reviewed with patient. Expectations of Nursing, provider, and security was reviewed with patient. Patient refusing to sign contract at this time and patient called his Child psychotherapist during leadership visit.  ?Hospital [facility] made aware. Contract placed in shadow chart.  ? ?Cleora Fleet RN AD ?Jesslynn Kruck RN4 ?

## 2022-02-26 NOTE — H&P (Signed)
?History and Physical  ? ? ?Patient: [name] OYD:[id number] DOB: 01/13/1966 ?DOA: 02/26/2022 ?DOS: the patient was seen and examined on 02/26/2022 ?PCP: Pcp, No  ?Patient coming from: Homeless ? ?Chief Complaint:  ?Chief Complaint  ?Patient presents with  ? Leg Pain  ? ?HPI: [name] is a 56 y.o. male with medical history significant of polyneuropathy, chronic combined HF, CAD, cocaine abuse, HLD, HTN, bipolar disorder. Presenting with orthopnea. He reports his symptoms started 2 - 3 days ago. He is unable to lie flat without shortness of breath. He has noticed increased swelling in his legs during the same time. He presented to the wound care clinic this morning for a regular exam. After he reported his symptoms to them, they recommended that he come to the ED for evaluation. He endorses crack cocaine use 4 days ago. He reports that he is not taking his prescribed medicines because he has now way to get them. He denies any other aggravating or alleviating factors.   ? ?Review of Systems: As mentioned in the history of present illness. All other systems reviewed and are negative. ?Past Medical History:  ?Diagnosis Date  ? Asthma   ? Bipolar 1 disorder (HCC)   ? CHF (congestive heart failure) (HCC)   ? COPD (chronic obstructive pulmonary disease) (HCC)   ? Depression   ? Diabetes mellitus without complication (HCC)   ? GERD (gastroesophageal reflux disease)   ? Homelessness   ? Hypertension   ? QT prolongation   ? Schizoaffective disorder (HCC)   ? ?Past Surgical History:  ?Procedure Laterality Date  ? 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 Procedure: AMPUTATION TOE;  Surgeon: Park Liter, DPM;  Location: [facility] OR;  Service: Podiatry;  Laterality: Bilateral;  Ok to leave patient on hospital bed  ? IR ANGIO INTRA EXTRACRAN SEL COM CAROTID INNOMINATE BILAT MOD SED  12/17/2020  ? IR ANGIO VERTEBRAL SEL VERTEBRAL BILAT MOD SED  12/17/2020  ? RADIOLOGY WITH ANESTHESIA N/A 12/17/2020  ? Procedure: IR WITH  ANESTHESIA;  Surgeon: Radiologist, Medication, MD;  Location: [facility] OR;  Service: Radiology;  Laterality: N/A;  ? ?Social History:  reports that he has been smoking cigarettes. He has never used smokeless tobacco. He reports current alcohol use. He reports current drug use. Drugs: Cocaine, Marijuana, and "Crack" cocaine. ? ?Allergies  ?Allergen Reactions  ? Ibuprofen Anaphylaxis  ? Acetaminophen Hives, Nausea And Vomiting and Rash  ?  Pt overdosed on bottle of APAP 05/06/16 ?  ? Lyrica Cr [Pregabalin Er] Other (See Comments)  ?  Dystonia and dysarthria  ? Peanut-Containing Drug Products Other (See Comments)  ?  Pt denies allergy to peanut butter 03/02/2021  ? ? ?Family History  ?Problem Relation Age of Onset  ? Drug abuse Mother   ? Suicidality Mother   ? ? ?Prior to Admission medications   ?Medication Sig Start Date End Date Taking? Authorizing Provider  ?albuterol (VENTOLIN HFA) 108 (90 Base) MCG/ACT inhaler Inhale 2 puffs into the lungs every 6 (six) hours as needed for wheezing or shortness of breath. ?Patient not taking: Reported on 01/31/2022 11/09/21   Terald Sleeper, MD  ?atorvastatin (LIPITOR) 10 MG tablet Take 4 tablets (40 mg total) by mouth daily. ?Patient not taking: Reported on 01/31/2022 11/09/21   Terald Sleeper, MD  ?carvedilol (COREG) 6.25 MG tablet Take 1 tablet (6.25 mg total) by mouth 2 (two) times daily with a meal. ?Patient not taking: Reported on 01/29/2022 12/09/21   Meredeth Ide,  MD  ?levETIRAcetam (KEPPRA) 500 MG tablet Take 1 tablet (500 mg total) by mouth 2 (two) times daily. ?Patient not taking: Reported on 01/31/2022 11/09/21 04/05/22  Terald Sleeper, MD  ?OLANZapine (ZYPREXA) 5 MG tablet Take 1 tablet (5 mg total) by mouth daily. ?Patient not taking: Reported on 12/03/2021 11/25/21   Laveda Abbe, NP  ?oxyCODONE (OXY IR/ROXICODONE) 5 MG immediate release tablet Take 1 tablet (5 mg total) by mouth every 6 (six) hours as needed for severe pain or breakthrough pain. 12/09/21   Meredeth Ide,  MD  ?spironolactone (ALDACTONE) 25 MG tablet Take 1 tablet (25 mg total) by mouth daily. ?Patient not taking: Reported on 01/31/2022 12/09/21   Meredeth Ide, MD  ?umeclidinium bromide (INCRUSE ELLIPTA) 62.5 MCG/INH AEPB Inhale 1 puff into the lungs daily. ?Patient not taking: Reported on 11/24/2021 11/16/20   Joycelyn Das, MD  ? ? ?Physical Exam: ?Vitals:  ? 02/26/22 1149  ?BP: (!) 154/99  ?Pulse: (!) 101  ?Resp: 20  ?Temp: 98.5 ?F (36.9 ?C)  ?TempSrc: Oral  ?SpO2: 100%  ? ?General: 56 y.o. male resting in bed in NAD ?Eyes: PERRL, normal sclera ?ENMT: Nares patent w/o discharge, orophaynx clear, dentition poor, ears w/o discharge/lesions/ulcers ?Neck: Supple, trachea midline ?Cardiovascular: RRR, +S1, S2, no m/g/r, equal pulses throughout ?Respiratory: CTABL, no w/r/r, normal WOB ?GI: BS+, NDNT, no masses noted, no organomegaly noted ?MSK: No c/c; BLE edema +4 pitting, chronic wounds BLE w/o drainage/erythema; b/l great toe amputations ?Neuro: A&O x 3, there is a generalized weakness of his extremities, he is dysarthric (this is chronic)  ?Psyc: Appropriate interaction and affect, calm/cooperative ? ?Data Reviewed: ? ?CO2 21 ?BNP 1573 ?Trp 64 ?Plt  413 ? ?EKG sinus tach, no st elevations ?CXR: Cardiomegaly. There are no signs of alveolar pulmonary edema or focal pulmonary consolidation. ? ?Assessment and Plan: ?No notes have been filed under this hospital service. ?Service: Hospitalist ?Acute on chronic combined HF ?    - admit to inpt, tele ?    - lasix ?    - limit fluids to 1200cc/day ?    - check echo ?    - monitor daily wts, I&O ? ?Chronic b/l lower extremity leg wounds ?    - no evidence of acute infection ?    - WOCN for dressing recommendation ? ?Elevated troponin ?    - no chest pain ?    - EKG w/o st elevations ?    - trend trp ? ?Cocaine abuse ?Tobacco abuse ?EtOH abuse ?    - report use 4 days ago; counseled against further use ?    - nicotine patch available ?    - CIWA protocol ? ?CAD ?HLD ?    -  continue hold regimen when confirmed ? ?COPD ?    - resume home regimen when confirmed ? ?Hx of seizure ?    - continue keppra ? ?Schizoaffective/bipolar ?    - continue home regimen when confirmed ? ?Advance Care Planning:   Code Status: FULL ? ?Consults: None ? ?Family Communication: None at bedside ? ?Severity of Illness: ?The appropriate patient status for this patient is INPATIENT. Inpatient status is judged to be reasonable and necessary in order to provide the required intensity of service to ensure the patient's safety. The patient's presenting symptoms, physical exam findings, and initial radiographic and laboratory data in the context of their chronic comorbidities is felt to place them at high risk for further clinical deterioration. Furthermore,  it is not anticipated that the patient will be medically stable for discharge from the hospital within 2 midnights of admission.  ? ?* I certify that at the point of admission it is my clinical judgment that the patient will require inpatient hospital care spanning beyond 2 midnights from the point of admission due to high intensity of service, high risk for further deterioration and high frequency of surveillance required.* ? ?Author: ?Teddy Spike, DO ?02/26/2022 3:09 PM ? ?For on call review [email or web address].  ?

## 2022-02-27 ENCOUNTER — Other Ambulatory Visit (HOSPITAL_COMMUNITY): Payer: Medicaid Other

## 2022-02-27 ENCOUNTER — Ambulatory Visit: Payer: Medicaid Other | Admitting: Physician Assistant

## 2022-02-27 DIAGNOSIS — F250 Schizoaffective disorder, bipolar type: Secondary | ICD-10-CM | POA: Diagnosis not present

## 2022-02-27 DIAGNOSIS — I5043 Acute on chronic combined systolic (congestive) and diastolic (congestive) heart failure: Secondary | ICD-10-CM | POA: Diagnosis not present

## 2022-02-27 DIAGNOSIS — R569 Unspecified convulsions: Secondary | ICD-10-CM | POA: Diagnosis not present

## 2022-02-27 DIAGNOSIS — J441 Chronic obstructive pulmonary disease with (acute) exacerbation: Secondary | ICD-10-CM | POA: Diagnosis not present

## 2022-02-27 LAB — COMPREHENSIVE METABOLIC PANEL
ALT: 21 U/L (ref 0–44)
AST: 23 U/L (ref 15–41)
Albumin: 3.2 g/dL — ABNORMAL LOW (ref 3.5–5.0)
Alkaline Phosphatase: 92 U/L (ref 38–126)
Anion gap: 10 (ref 5–15)
BUN: 21 mg/dL — ABNORMAL HIGH (ref 6–20)
CO2: 23 mmol/L (ref 22–32)
Calcium: 8.5 mg/dL — ABNORMAL LOW (ref 8.9–10.3)
Chloride: 101 mmol/L (ref 98–111)
Creatinine, Ser: 1.37 mg/dL — ABNORMAL HIGH (ref 0.61–1.24)
GFR, Estimated: >60 mL/min (ref >60)
Glucose, Bld: 92 mg/dL (ref 70–99)
Potassium: 3.8 mmol/L (ref 3.5–5.1)
Sodium: 134 mmol/L — ABNORMAL LOW (ref 135–145)
Total Bilirubin: 0.8 mg/dL (ref 0.3–1.2)
Total Protein: 6.7 g/dL (ref 6.5–8.1)

## 2022-02-27 LAB — CBC
HCT: 40.5 % (ref 39.0–52.0)
Hemoglobin: 12.8 g/dL — ABNORMAL LOW (ref 13.0–17.0)
MCH: 31.6 pg (ref 26.0–34.0)
MCHC: 31.6 g/dL (ref 30.0–36.0)
MCV: 100 fL (ref 80.0–100.0)
Platelets: 365 10*3/uL (ref 150–400)
RBC: 4.05 MIL/uL — ABNORMAL LOW (ref 4.22–5.81)
RDW: 14.6 % (ref 11.5–15.5)
WBC: 7.7 10*3/uL (ref 4.0–10.5)
nRBC: 0 % (ref 0.0–0.2)

## 2022-02-27 LAB — HIV ANTIBODY (ROUTINE TESTING W REFLEX): HIV Screen 4th Generation wRfx: NONREACTIVE

## 2022-02-27 MED ORDER — OLANZAPINE 5 MG PO TABS
5.0000 mg | ORAL_TABLET | Freq: Every day | ORAL | Status: DC
  Administered 2022-02-27 – 2022-03-07 (×9): 5 mg via ORAL
  Filled 2022-02-27 (×10): qty 1

## 2022-02-27 MED ORDER — TRAZODONE HCL 50 MG PO TABS
25.0000 mg | ORAL_TABLET | Freq: Once | ORAL | Status: DC
  Filled 2022-02-27: qty 1

## 2022-02-27 MED ORDER — ATORVASTATIN CALCIUM 40 MG PO TABS
40.0000 mg | ORAL_TABLET | Freq: Every day | ORAL | Status: DC
  Administered 2022-02-27 – 2022-03-07 (×9): 40 mg via ORAL
  Filled 2022-02-27 (×10): qty 1

## 2022-02-27 MED ORDER — CARVEDILOL 6.25 MG PO TABS
6.2500 mg | ORAL_TABLET | Freq: Two times a day (BID) | ORAL | Status: DC
  Administered 2022-02-27 – 2022-03-03 (×8): 6.25 mg via ORAL
  Filled 2022-02-27 (×9): qty 1

## 2022-02-27 MED ORDER — IPRATROPIUM-ALBUTEROL 0.5-2.5 (3) MG/3ML IN SOLN
3.0000 mL | Freq: Four times a day (QID) | RESPIRATORY_TRACT | Status: DC
  Administered 2022-02-27 – 2022-03-01 (×7): 3 mL via RESPIRATORY_TRACT
  Filled 2022-02-27 (×8): qty 3

## 2022-02-27 MED ORDER — METHYLPREDNISOLONE SODIUM SUCC 40 MG IJ SOLR
40.0000 mg | Freq: Two times a day (BID) | INTRAMUSCULAR | Status: DC
  Administered 2022-02-27 – 2022-02-28 (×3): 40 mg via INTRAVENOUS
  Filled 2022-02-27 (×4): qty 1

## 2022-02-27 MED ORDER — GUAIFENESIN ER 600 MG PO TB12
600.0000 mg | ORAL_TABLET | Freq: Two times a day (BID) | ORAL | Status: DC
  Administered 2022-02-27 – 2022-03-07 (×16): 600 mg via ORAL
  Filled 2022-02-27 (×18): qty 1

## 2022-02-27 MED ORDER — GABAPENTIN 300 MG PO CAPS
300.0000 mg | ORAL_CAPSULE | Freq: Every day | ORAL | Status: DC
  Administered 2022-02-27 – 2022-02-28 (×2): 300 mg via ORAL
  Filled 2022-02-27 (×3): qty 1

## 2022-02-27 MED ORDER — ENOXAPARIN SODIUM 40 MG/0.4ML IJ SOSY
40.0000 mg | PREFILLED_SYRINGE | INTRAMUSCULAR | Status: DC
  Administered 2022-02-28 – 2022-03-06 (×5): 40 mg via SUBCUTANEOUS
  Filled 2022-02-27 (×6): qty 0.4

## 2022-02-27 MED ORDER — POTASSIUM CHLORIDE CRYS ER 20 MEQ PO TBCR
20.0000 meq | EXTENDED_RELEASE_TABLET | Freq: Once | ORAL | Status: AC
  Administered 2022-02-27: 20 meq via ORAL
  Filled 2022-02-27: qty 1

## 2022-02-27 NOTE — Progress Notes (Signed)
Orthopedic Tech Progress Note ?Patient Details:  ?[name] ?July 10, 1966 ?[id number] ? ?Patient ID: [name], male   DOB: 1966/04/30, 56 y.o.   MRN: [id number] ? ?[name] ?02/27/2022, 5:02 PM ?Bi lat unna boot applied ?

## 2022-02-27 NOTE — Consult Note (Signed)
WOC Nurse Consult Note: ?Reason for Consult: several wounds to bilateral LEs, partial and small full thickness ?Patient known to [place] form past admissions. Last seen on 02/03/22. Seen by Orthopedic MD (Dr. Jonathon Bellows) on 02/01/22 who recommended Unna's Boots. ?Wound type:Venous insufficiency ?Pressure Injury POA: N/A ?Measurement:Several lesions in varying stages of tissue repair and wound regeneration, most are reepithelializing and partial thickness. ?Wound bed: Pink, granulating, dry (no exudate) ?Drainage (amount, consistency, odor) None ?Periwound: edematous ?Dressing procedure/placement/frequency: I will proceed with initiation of guidance for placement of bilateral Unna's Boots by Ortho Tech.  Placement will be today and twice weekly while in house.  ?A plan will be needed for downstream maintenance of the Unna's Boots and follow up in the outpatient wound care center, since he has been seen there at intervals for this problem in the past.  ? ?WOC nursing team will not follow, but will remain available to this patient, the nursing and medical teams.  Please re-consult if needed. ?Thanks, ?Ladona Mow, MSN, RN, GNP, CWOCN, CWON-AP, FAAN  ?Pager# [phone]  ? ? ? ?  ?

## 2022-02-27 NOTE — Progress Notes (Signed)
RN and NT entered room to get vitals and give meds. RN explained to patient what meds she had for him and the NT was going to get vitals. PT became very agitated and started yelling at RN and NT. PT yelled "I dont need any meds!" RN asked patient if she could listen to his lungs with her stethoscope and PT yelled "Just leave me alone!"  ? ?Dr Johann Capers made aware that patient refused medications and vitals and to allow RN to assess him.  ? ? ?

## 2022-02-27 NOTE — Progress Notes (Signed)
[facility] ? ?PROGRESS NOTE ? ?Kordel Leavy OVF:[id number] DOB: [birth year] DOA: 02/26/2022 ?PCP: Pcp, No ? ? ?Brief HPI:   ?56 year old male with medical history of polyneuropathy, chronic combined CHF, CAD, cocaine abuse, hyperlipidemia, hypertension, bipolar disorder, COPD presented with shortness of breath.  Patient also noticed increase swelling in his legs.  He was started on IV Lasix for possible CHF exacerbation. ? ? ?Subjective  ? ?This morning patient complains of leg pain and shortness of breath. ? ? Assessment/Plan:  ? ? ?COPD exacerbation ?-We will start patient on DuoNeb nebulizer every 6 hours ?-Solu-Medrol 40 mg IV every 12 hours ?-Mucinex 600 mg p.o. twice daily ? ?Chronic combined systolic and diastolic CHF ?-BNP was elevated 1573.5 ?-He was started on IV Lasix ?-Currently appears euvolemic, creatinine is rising ? -will hold IV Lasix at this time ? ?Chronic bilateral lower extremity wounds ?-No evidence of draining wounds ?-Wound care consulted for dressing recommendations ? ?Bilateral leg pain/neuropathy ?-We will start gabapentin 300 mg p.o. daily ? ?Elevated troponin ?-Troponin elevated 64, 60 ?-Likely demand ischemia ?-No chest pain ? ?History of cocaine abuse/alcohol abuse ?-Reported use 4 days ago ?-Started on CIWA protocol ? ?CAD ?-Restart home regimen including Coreg, Lipitor ? ?History of seizures ?-Continue Keppra ? ?Schizoaffective/bipolar disorder ?-Continue Zyprexa ? ? ?Medications ? ?  ? enoxaparin (LOVENOX) injection  40 mg Subcutaneous Q24H  ? folic acid  1 mg Oral Daily  ? gabapentin  300 mg Oral Daily  ? guaiFENesin  600 mg Oral BID  ? ipratropium-albuterol  3 mL Nebulization Q6H  ? levETIRAcetam  500 mg Oral BID  ? methylPREDNISolone (SOLU-MEDROL) injection  40 mg Intravenous Q12H  ? multivitamin with minerals  1 tablet Oral Daily  ? thiamine  100 mg Oral Daily  ? Or  ? thiamine  100 mg Intravenous Daily  ? traZODone  25 mg Oral Once  ? ? ? Data Reviewed:  ? ?CBG: ? ?No  results for input(s): GLUCAP in the last 168 hours. ? ?SpO2: 91 %  ? ? ?Vitals:  ? 02/27/22 0657 02/27/22 0737 02/27/22 1335 02/27/22 1341  ?BP: (!) 146/100   (!) 150/118  ?Pulse: (!) 104   (!) 102  ?Resp: 18     ?Temp: 98 ?F (36.7 ?C)   98 ?F (36.7 ?C)  ?TempSrc: Oral   Axillary  ?SpO2: 96%  90% 91%  ?Weight:  88.5 kg    ? ? ? ? ?Data Reviewed: ? ?Basic Metabolic Panel: ?Recent Labs  ?Lab 02/26/22 ?1345 02/27/22 ?0415  ?NA 136 134*  ?K 3.8 3.8  ?CL 107 101  ?CO2 21* 23  ?GLUCOSE 97 92  ?BUN 20 21*  ?CREATININE 1.00 1.37*  ?CALCIUM 8.5* 8.5*  ? ? ?CBC: ?Recent Labs  ?Lab 02/26/22 ?1345 02/27/22 ?0415  ?WBC 6.4 7.7  ?NEUTROABS 3.5  --   ?HGB 14.0 12.8*  ?HCT 41.9 40.5  ?MCV 95.0 100.0  ?PLT 413* 365  ? ? ?LFT ?Recent Labs  ?Lab 02/27/22 ?0415  ?AST 23  ?ALT 21  ?ALKPHOS 92  ?BILITOT 0.8  ?PROT 6.7  ?ALBUMIN 3.2*  ? ?  ?Antibiotics: ?Anti-infectives (From admission, onward)  ? ? None  ? ?  ? ? ? ?DVT prophylaxis: Lovenox ? ?Code Status: Full code ? ?Family Communication: No family at bedside ? ? ?CONSULTS  ? ? ?Objective  ? ? ?Physical Examination: ? ? ?General-appears in no acute distress ?Heart-S1-S2, regular, no murmur auscultated ?Lungs-bilateral wheezing auscultated ?Abdomen-soft, nontender, no organomegaly ?Extremities-no edema  in the lower extremities ?Neuro-alert, oriented x3, no focal deficit noted ? ?Status is: Inpatient: COPD exacerbation ? ? ? ?  ? ? ? ?Meredeth Ide ?  ?[facility] ?If 7PM-7AM, please contact night-coverage at [email or web address], ?Office  [phone] ? ? ?02/27/2022, 4:03 PM  LOS: 1 day  ? ? ? ? ? ? ? ? ? ? ?  ?

## 2022-02-27 NOTE — Progress Notes (Signed)
Patient refused his 0245 vitals. MD notified. ?

## 2022-02-28 ENCOUNTER — Encounter (HOSPITAL_COMMUNITY): Payer: Self-pay | Admitting: Internal Medicine

## 2022-02-28 DIAGNOSIS — R569 Unspecified convulsions: Secondary | ICD-10-CM | POA: Diagnosis not present

## 2022-02-28 DIAGNOSIS — R778 Other specified abnormalities of plasma proteins: Secondary | ICD-10-CM | POA: Diagnosis not present

## 2022-02-28 DIAGNOSIS — J441 Chronic obstructive pulmonary disease with (acute) exacerbation: Secondary | ICD-10-CM | POA: Diagnosis not present

## 2022-02-28 DIAGNOSIS — F250 Schizoaffective disorder, bipolar type: Secondary | ICD-10-CM | POA: Diagnosis not present

## 2022-02-28 LAB — BASIC METABOLIC PANEL
Anion gap: 8 (ref 5–15)
BUN: 25 mg/dL — ABNORMAL HIGH (ref 6–20)
CO2: 24 mmol/L (ref 22–32)
Calcium: 8.8 mg/dL — ABNORMAL LOW (ref 8.9–10.3)
Chloride: 103 mmol/L (ref 98–111)
Creatinine, Ser: 1.28 mg/dL — ABNORMAL HIGH (ref 0.61–1.24)
GFR, Estimated: >60 mL/min (ref >60)
Glucose, Bld: 96 mg/dL (ref 70–99)
Potassium: 4.7 mmol/L (ref 3.5–5.1)
Sodium: 135 mmol/L (ref 135–145)

## 2022-02-28 MED ORDER — FUROSEMIDE 40 MG PO TABS
40.0000 mg | ORAL_TABLET | Freq: Every day | ORAL | Status: DC
  Administered 2022-02-28 – 2022-03-03 (×4): 40 mg via ORAL
  Filled 2022-02-28 (×4): qty 1

## 2022-02-28 MED ORDER — HYDROMORPHONE HCL 1 MG/ML IJ SOLN
1.0000 mg | INTRAMUSCULAR | Status: DC | PRN
  Administered 2022-02-28 – 2022-03-04 (×20): 1 mg via INTRAVENOUS
  Filled 2022-02-28 (×20): qty 1

## 2022-02-28 MED ORDER — GABAPENTIN 300 MG PO CAPS
300.0000 mg | ORAL_CAPSULE | Freq: Two times a day (BID) | ORAL | Status: DC
Start: 2022-02-28 — End: 2022-03-01
  Administered 2022-02-28 – 2022-03-01 (×2): 300 mg via ORAL
  Filled 2022-02-28 (×2): qty 1

## 2022-02-28 NOTE — TOC Progression Note (Signed)
Transition of Care (TOC) - Progression Note  ? ? ?Patient Details  ?Name: Randall Colden ?MRN: [id number] ?Date of Birth: Mar 18, 1966 ? ?Transition of Care (TOC) CM/SW Contact  ?Geni Bers, RN ?Phone Number: ?02/28/2022, 2:41 PM ? ?Clinical Narrative:    ? ?Well known to [facility] team. Will continue to follow. Pt is homeless, no SNF will take him.  ? ?Expected Discharge Plan: Homeless Shelter ?Barriers to Discharge: No Barriers Identified ? ?Expected Discharge Plan and Services ?Expected Discharge Plan: Homeless Shelter ?  ?  ?  ?Living arrangements for the past 2 months: Homeless ?                ?  ?  ?  ?  ?  ?  ?  ?  ?  ?  ? ? ?Social Determinants of Health (SDOH) Interventions ?  ? ?Readmission Risk Interventions ? ?  02/04/2022  ?  4:41 PM 12/05/2021  ? 11:48 AM 03/05/2021  ?  2:42 PM  ?Readmission Risk Prevention Plan  ?Transportation Screening Complete Complete Complete  ?Medication Review Oceanographer) Complete Not Complete Complete  ?PCP or Specialist appointment within 3-5 days of discharge Complete Not Complete Complete  ?HRI or Home Care Consult Complete Not Complete Complete  ?SW Recovery Care/Counseling Consult Complete Not Complete Complete  ?Palliative Care Screening Not Applicable Not Applicable Not Applicable  ?Skilled Nursing Facility Complete Not Applicable Complete  ? ? ?

## 2022-02-28 NOTE — Progress Notes (Signed)
PT refusing cardiac telemetry monitor at this time. MD aware ?

## 2022-02-28 NOTE — Progress Notes (Signed)
[facility] ? ?PROGRESS NOTE ? ?Deundra Bard RJJ:[id number] DOB: Feb 04, 1966 DOA: 02/26/2022 ?PCP: Pcp, No ? ? ?Brief HPI:   ?56 year old male with medical history of polyneuropathy, chronic combined CHF, CAD, cocaine abuse, hyperlipidemia, hypertension, bipolar disorder, COPD presented with shortness of breath.  Patient also noticed increase swelling in his legs.  He was started on IV Lasix for possible CHF exacerbation. ? ? ?Subjective  ? ?Patient seen and examined, still complains of leg pain due to neuropathy.  Was started on gabapentin yesterday. ? ? Assessment/Plan:  ? ? ?COPD exacerbation ?-We will start patient on DuoNeb nebulizer every 6 hours ?-Solu-Medrol 40 mg IV every 12 hours ?-Mucinex 600 mg p.o. twice daily ? ?Chronic combined systolic and diastolic CHF ?-BNP was elevated 1573.5 ?-He was started on IV Lasix ?-Currently appears euvolemic, creatinine is rising ? -will hold IV Lasix at this time ?-We will start p.o. Lasix 40 mg daily ?-Follow BMP in am ? ?Chronic bilateral lower extremity wounds ?-No evidence of draining wounds ?-Wound care consulted for dressing recommendations ? ?Bilateral leg pain/neuropathy ?-Started on gabapentin 300 mg p.o. daily, will increase dose to 300 mg p.o. twice daily ?-Start Dilaudid 1 mg IV every 4 hours as needed for pain ? ?Elevated troponin ?-Troponin elevated 64, 60 ?-Likely demand ischemia ?-No chest pain ? ?History of cocaine abuse/alcohol abuse ?-Reported use 4 days ago ?-Started on CIWA protocol ? ?CAD ?-Restart home regimen including Coreg, Lipitor ? ?History of seizures ?-Continue Keppra ? ?Schizoaffective/bipolar disorder ?-Continue Zyprexa ? ? ?Medications ? ?  ? atorvastatin  40 mg Oral Daily  ? carvedilol  6.25 mg Oral BID WC  ? enoxaparin (LOVENOX) injection  40 mg Subcutaneous Q24H  ? folic acid  1 mg Oral Daily  ? furosemide  40 mg Oral Daily  ? gabapentin  300 mg Oral Daily  ? guaiFENesin  600 mg Oral BID  ? ipratropium-albuterol  3 mL  Nebulization Q6H  ? levETIRAcetam  500 mg Oral BID  ? methylPREDNISolone (SOLU-MEDROL) injection  40 mg Intravenous Q12H  ? multivitamin with minerals  1 tablet Oral Daily  ? OLANZapine  5 mg Oral Daily  ? thiamine  100 mg Oral Daily  ? Or  ? thiamine  100 mg Intravenous Daily  ? traZODone  25 mg Oral Once  ? ? ? Data Reviewed:  ? ?CBG: ? ?No results for input(s): GLUCAP in the last 168 hours. ? ?SpO2: 96 %  ? ? ?Vitals:  ? 02/27/22 1341 02/28/22 0858 02/28/22 0908 02/28/22 1350  ?BP: (!) 150/118  (!) 129/91   ?Pulse: (!) 102  95   ?Resp:   18   ?Temp: 98 ?F (36.7 ?C)  97.7 ?F (36.5 ?C)   ?TempSrc: Axillary  Oral   ?SpO2: 91% 93% 99% 96%  ?Weight:      ? ? ? ? ?Data Reviewed: ? ?Basic Metabolic Panel: ?Recent Labs  ?Lab 02/26/22 ?1345 02/27/22 ?0415 02/28/22 ?0410  ?NA 136 134* 135  ?K 3.8 3.8 4.7  ?CL 107 101 103  ?CO2 21* 23 24  ?GLUCOSE 97 92 96  ?BUN 20 21* 25*  ?CREATININE 1.00 1.37* 1.28*  ?CALCIUM 8.5* 8.5* 8.8*  ? ? ?CBC: ?Recent Labs  ?Lab 02/26/22 ?1345 02/27/22 ?0415  ?WBC 6.4 7.7  ?NEUTROABS 3.5  --   ?HGB 14.0 12.8*  ?HCT 41.9 40.5  ?MCV 95.0 100.0  ?PLT 413* 365  ? ? ?LFT ?Recent Labs  ?Lab 02/27/22 ?0415  ?AST 23  ?ALT 21  ?  ALKPHOS 92  ?BILITOT 0.8  ?PROT 6.7  ?ALBUMIN 3.2*  ? ?  ?Antibiotics: ?Anti-infectives (From admission, onward)  ? ? None  ? ?  ? ? ? ?DVT prophylaxis: Lovenox ? ?Code Status: Full code ? ?Family Communication: No family at bedside ? ? ?CONSULTS  ? ? ?Objective  ? ? ?Physical Examination: ? ?General-appears in no acute distress ?Heart-S1-S2, regular, no murmur auscultated ?Lungs-clear to auscultation bilaterally, no wheezing or crackles auscultated ?Abdomen-soft, nontender, no organomegaly ?Extremities-no edema in the lower extremities ?Neuro-alert, oriented x3, no focal deficit  ? ?Status is: Inpatient: COPD exacerbation ? ? ?  ? ?Meredeth Ide ?  ?[facility] ?If 7PM-7AM, please contact night-coverage at [email or web address], ?Office  [phone] ? ? ?02/28/2022, 2:48 PM  LOS: 2  days  ? ? ? ? ? ? ? ? ? ? ?  ?

## 2022-03-01 DIAGNOSIS — R778 Other specified abnormalities of plasma proteins: Secondary | ICD-10-CM | POA: Diagnosis not present

## 2022-03-01 DIAGNOSIS — J441 Chronic obstructive pulmonary disease with (acute) exacerbation: Secondary | ICD-10-CM | POA: Diagnosis not present

## 2022-03-01 DIAGNOSIS — F250 Schizoaffective disorder, bipolar type: Secondary | ICD-10-CM | POA: Diagnosis not present

## 2022-03-01 DIAGNOSIS — R569 Unspecified convulsions: Secondary | ICD-10-CM | POA: Diagnosis not present

## 2022-03-01 LAB — BASIC METABOLIC PANEL
Anion gap: 8 (ref 5–15)
BUN: 31 mg/dL — ABNORMAL HIGH (ref 6–20)
CO2: 22 mmol/L (ref 22–32)
Calcium: 8.5 mg/dL — ABNORMAL LOW (ref 8.9–10.3)
Chloride: 106 mmol/L (ref 98–111)
Creatinine, Ser: 1.16 mg/dL (ref 0.61–1.24)
GFR, Estimated: >60 mL/min (ref >60)
Glucose, Bld: 164 mg/dL — ABNORMAL HIGH (ref 70–99)
Potassium: 5.3 mmol/L — ABNORMAL HIGH (ref 3.5–5.1)
Sodium: 136 mmol/L (ref 135–145)

## 2022-03-01 MED ORDER — GABAPENTIN 300 MG PO CAPS
300.0000 mg | ORAL_CAPSULE | Freq: Three times a day (TID) | ORAL | Status: DC
Start: 2022-03-01 — End: 2022-03-05
  Administered 2022-03-01 – 2022-03-05 (×13): 300 mg via ORAL
  Filled 2022-03-01 (×13): qty 1

## 2022-03-01 MED ORDER — PREDNISONE 10 MG PO TABS
10.0000 mg | ORAL_TABLET | Freq: Every day | ORAL | Status: DC
  Administered 2022-03-01: 10 mg via ORAL
  Filled 2022-03-01: qty 1

## 2022-03-01 MED ORDER — SENNOSIDES-DOCUSATE SODIUM 8.6-50 MG PO TABS
1.0000 | ORAL_TABLET | Freq: Two times a day (BID) | ORAL | Status: AC
  Administered 2022-03-01 – 2022-03-02 (×3): 1 via ORAL
  Filled 2022-03-01 (×4): qty 1

## 2022-03-01 MED ORDER — IPRATROPIUM-ALBUTEROL 0.5-2.5 (3) MG/3ML IN SOLN
3.0000 mL | Freq: Three times a day (TID) | RESPIRATORY_TRACT | Status: DC
  Administered 2022-03-02 (×3): 3 mL via RESPIRATORY_TRACT
  Filled 2022-03-01 (×3): qty 3

## 2022-03-01 MED ORDER — SENNOSIDES-DOCUSATE SODIUM 8.6-50 MG PO TABS: 1.0000 | ORAL_TABLET | Freq: Two times a day (BID) | ORAL | Status: DC

## 2022-03-01 MED ORDER — PREDNISONE 20 MG PO TABS
40.0000 mg | ORAL_TABLET | Freq: Every day | ORAL | Status: DC
  Administered 2022-03-02 – 2022-03-04 (×3): 40 mg via ORAL
  Filled 2022-03-01 (×3): qty 2

## 2022-03-01 NOTE — Progress Notes (Signed)
[facility] ? ?PROGRESS NOTE ? ?[name] [id number] DOB: 03-12-1966 DOA: 02/26/2022 ?PCP: Pcp, No ? ? ?Brief HPI:   ? ?56 year old male with medical history of polyneuropathy, chronic combined CHF, CAD, cocaine abuse, hyperlipidemia, hypertension, bipolar disorder, COPD presented with shortness of breath.  Patient also noticed increase swelling in his legs.  He was started on IV Lasix for possible CHF exacerbation. ? ? ?Subjective  ? ?Patient seen and examined, still complains of burning pain in the legs.  Breathing is improved. ? ? Assessment/Plan:  ? ? ?COPD exacerbation ?-Patient was started on DuoNeb nebulizer every 6 hours ?-Solu-Medrol 40 mg IV every 12 hours ?-Mucinex 600 mg p.o. twice daily ?-We will discontinue IV Solu-Medrol and start prednisone 40 mg daily from tomorrow morning ? ?Acute on chronic combined systolic and diastolic CHF ?-BNP was elevated 1573.5 ?-He was started on IV Lasix ?-Currently appears euvolemic, creatinine is rising ? - IV Lasix was held  ?-Patient started on  Lasix 40 mg daily ?-Follow BMP in am ? ?Acute kidney injury ?-Creatinine was elevated due to diuresis with Lasix ?-Creatinine improved after stopping IV Lasix ?-Follow BMP in am ? ?Chronic bilateral lower extremity wounds ?-No evidence of draining wounds ?-Wound care consulted for dressing recommendations ? ?Bilateral leg pain/neuropathy ?-Started on gabapentin 300 mg p.o. daily, will increase dose to 300 mg p.o. twice daily ?-Start Dilaudid 1 mg IV every 4 hours as needed for pain ? ?Elevated troponin ?-Troponin elevated 64, 60 ?-Likely demand ischemia ?-No chest pain ? ?History of cocaine abuse/alcohol abuse ?-Reported use 4 days ago ?-Started on CIWA protocol ? ?CAD ?-Restart home regimen including Coreg, Lipitor ? ?History of seizures ?-Continue Keppra ? ?Schizoaffective/bipolar disorder ?-Continue Zyprexa ? ? ?Medications ? ?  ? atorvastatin  40 mg Oral Daily  ? carvedilol  6.25 mg Oral BID WC  ? enoxaparin  (LOVENOX) injection  40 mg Subcutaneous [id number]  ? folic acid  1 mg Oral Daily  ? furosemide  40 mg Oral Daily  ? gabapentin  300 mg Oral TID  ? guaiFENesin  600 mg Oral BID  ? ipratropium-albuterol  3 mL Nebulization Q6H  ? levETIRAcetam  500 mg Oral BID  ? multivitamin with minerals  1 tablet Oral Daily  ? OLANZapine  5 mg Oral Daily  ? [START ON 03/02/2022] predniSONE  40 mg Oral Q breakfast  ? senna-docusate  1 tablet Oral BID  ? thiamine  100 mg Oral Daily  ? Or  ? thiamine  100 mg Intravenous Daily  ? traZODone  25 mg Oral Once  ? ? ? Data Reviewed:  ? ?CBG: ? ?No results for input(s): GLUCAP in the last 168 hours. ? ?SpO2: 97 %  ? ? ?Vitals:  ? 03/01/22 0410 03/01/22 0502 03/01/22 [id number] 03/01/22 1229  ?BP: (!) 158/109 (!) 147/102  (!) 127/100  ?Pulse: 89 85  84  ?Resp: 20 18  20   ?Temp: 97.6 ?F (36.4 ?C)   97.7 ?F (36.5 ?C)  ?TempSrc: Oral   Oral  ?SpO2: 98% 98% 96% 97%  ?Weight:      ?Height:      ? ? ? ? ?Data Reviewed: ? ?Basic Metabolic Panel: ?Recent Labs  ?Lab 02/26/22 ?1345 02/27/22 ?0415 02/28/22 ?0410 03/01/22 ?0353  ?NA 136 134* 135 136  ?K 3.8 3.8 4.7 5.3*  ?CL 107 101 103 106  ?CO2 21* 23 24 22   ?GLUCOSE 97 92 96 164*  ?BUN 20 21* 25* 31*  ?CREATININE 1.00 1.37* 1.28*  1.16  ?CALCIUM 8.5* 8.5* 8.8* 8.5*  ? ? ?CBC: ?Recent Labs  ?Lab 02/26/22 ?1345 02/27/22 ?0415  ?WBC 6.4 7.7  ?NEUTROABS 3.5  --   ?HGB 14.0 12.8*  ?HCT 41.9 40.5  ?MCV 95.0 100.0  ?PLT 413* 365  ? ? ?LFT ?Recent Labs  ?Lab 02/27/22 ?0415  ?AST 23  ?ALT 21  ?ALKPHOS 92  ?BILITOT 0.8  ?PROT 6.7  ?ALBUMIN 3.2*  ? ?  ?Antibiotics: ?Anti-infectives (From admission, onward)  ? ? None  ? ?  ? ? ? ?DVT prophylaxis: Lovenox ? ?Code Status: Full code ? ?Family Communication: No family at bedside ? ? ?CONSULTS  ? ? ?Objective  ? ? ?Physical Examination: ? ?General-appears in no acute distress ?Heart-S1-S2, regular, no murmur auscultated ?Lungs-clear to auscultation bilaterally, no wheezing or crackles auscultated ?Abdomen-soft, nontender, no  organomegaly ?Extremities-no edema in the lower extremities ?Neuro-alert, oriented x3, no focal deficit noted ? ?Status is: Inpatient: COPD exacerbation ? ? ?  ? ?[name] ?  ?[facility] ?If 7PM-7AM, please contact night-coverage at [email or web address], ?Office  [phone] ? ? ?03/01/2022, 1:59 PM  LOS: 3 days  ? ? ? ? ? ? ? ? ? ? ?  ?

## 2022-03-02 DIAGNOSIS — R569 Unspecified convulsions: Secondary | ICD-10-CM | POA: Diagnosis not present

## 2022-03-02 DIAGNOSIS — J441 Chronic obstructive pulmonary disease with (acute) exacerbation: Secondary | ICD-10-CM | POA: Diagnosis not present

## 2022-03-02 DIAGNOSIS — I5043 Acute on chronic combined systolic (congestive) and diastolic (congestive) heart failure: Secondary | ICD-10-CM | POA: Diagnosis not present

## 2022-03-02 DIAGNOSIS — F250 Schizoaffective disorder, bipolar type: Secondary | ICD-10-CM | POA: Diagnosis not present

## 2022-03-02 LAB — BASIC METABOLIC PANEL
Anion gap: 9 (ref 5–15)
BUN: 34 mg/dL — ABNORMAL HIGH (ref 6–20)
CO2: 25 mmol/L (ref 22–32)
Calcium: 8.6 mg/dL — ABNORMAL LOW (ref 8.9–10.3)
Chloride: 107 mmol/L (ref 98–111)
Creatinine, Ser: 1.57 mg/dL — ABNORMAL HIGH (ref 0.61–1.24)
GFR, Estimated: 52 mL/min — ABNORMAL LOW (ref >60)
Glucose, Bld: 138 mg/dL — ABNORMAL HIGH (ref 70–99)
Potassium: 3.8 mmol/L (ref 3.5–5.1)
Sodium: 141 mmol/L (ref 135–145)

## 2022-03-02 MED ORDER — IPRATROPIUM-ALBUTEROL 0.5-2.5 (3) MG/3ML IN SOLN
3.0000 mL | Freq: Four times a day (QID) | RESPIRATORY_TRACT | Status: DC | PRN
  Administered 2022-03-02 (×2): 3 mL via RESPIRATORY_TRACT
  Filled 2022-03-02 (×2): qty 3

## 2022-03-02 NOTE — Progress Notes (Addendum)
[facility] ? ?PROGRESS NOTE ? ?[name] [id number]:[id number] DOB: March 19, 1966 DOA: 02/26/2022 ?PCP: Pcp, No ? ? ?Brief HPI:   ? ?56 year old male with medical history of polyneuropathy, chronic combined CHF, CAD, cocaine abuse, hyperlipidemia, hypertension, bipolar disorder, COPD presented with shortness of breath.  Patient also noticed increase swelling in his legs.  He was started on IV Lasix for possible CHF exacerbation. ? ? ?Subjective  ? ?Patient seen and examined, denies shortness of breath.  Complains of pain in the lower extremities ? ? Assessment/Plan:  ? ? ?COPD exacerbation ?-Patient was started on DuoNeb nebulizer every 6 hours ?-Solu-Medrol 40 mg IV every 12 hours ?-Mucinex 600 mg p.o. twice daily ?-Continue prednisone 40 mg daily ? ? ?Acute on chronic combined systolic and diastolic CHF ?-BNP was elevated 1573.5 ?-He was started on IV Lasix ?-Currently appears euvolemic, creatinine is rising ? - IV Lasix was held  ?-Patient started on  Lasix 40 mg daily ?-Net -3.5 L ?-Follow BMP, BNP in am ? ?Acute kidney injury ?-Creatinine was elevated due to diuresis with Lasix ?-Follow BMP in am ? ?Chronic bilateral lower extremity wounds ?-No evidence of draining wounds ?-Wound care consulted for dressing recommendations ? ?Bilateral leg pain/neuropathy ?-Started on gabapentin 300 mg p.o. 3 times daily  ?-Continue Dilaudid 1 mg IV every 4 hours as needed for pain ? ?Elevated troponin ?-Troponin elevated 64, 60 ?-Likely demand ischemia ?-No chest pain ? ?History of cocaine abuse/alcohol abuse ?-Reported use 4 days ago ?-Started on CIWA protocol ? ?CAD ?-Restart home regimen including Coreg, Lipitor ? ?History of seizures ?-Continue Keppra ? ?Schizoaffective/bipolar disorder ?-Continue Zyprexa ? ? ?Medications ? ?  ? atorvastatin  40 mg Oral Daily  ? carvedilol  6.25 mg Oral BID WC  ? enoxaparin (LOVENOX) injection  40 mg Subcutaneous Q24H  ? folic acid  1 mg Oral Daily  ? furosemide  40 mg Oral Daily  ?  gabapentin  300 mg Oral TID  ? guaiFENesin  600 mg Oral BID  ? ipratropium-albuterol  3 mL Nebulization TID  ? levETIRAcetam  500 mg Oral BID  ? multivitamin with minerals  1 tablet Oral Daily  ? OLANZapine  5 mg Oral Daily  ? predniSONE  40 mg Oral Q breakfast  ? senna-docusate  1 tablet Oral BID  ? thiamine  100 mg Oral Daily  ? Or  ? thiamine  100 mg Intravenous Daily  ? traZODone  25 mg Oral Once  ? ? ? Data Reviewed:  ? ?CBG: ? ?No results for input(s): GLUCAP in the last 168 hours. ? ?SpO2: 96 %  ? ? ?Vitals:  ? 03/02/22 0821 03/02/22 1156 03/02/22 1302 03/02/22 1508  ?BP:   126/90   ?Pulse:   (!) 40   ?Resp:   18   ?Temp:   97.7 ?F (36.5 ?C)   ?TempSrc:   Oral   ?SpO2: 99% 100% 97% 96%  ?Weight:      ?Height:      ? ? ? ? ?Data Reviewed: ? ?Basic Metabolic Panel: ?Recent Labs  ?Lab 02/26/22 ?1345 02/27/22 ?0415 02/28/22 ?0410 03/01/22 ?9417 03/02/22 ?0417  ?NA 136 134* 135 136 141  ?K 3.8 3.8 4.7 5.3* 3.8  ?CL 107 101 103 106 107  ?CO2 21* 23 24 22 25   ?GLUCOSE 97 92 96 164* 138*  ?BUN 20 21* 25* 31* 34*  ?CREATININE 1.00 1.37* 1.28* 1.16 1.57*  ?CALCIUM 8.5* 8.5* 8.8* 8.5* 8.6*  ? ? ?CBC: ?Recent Labs  ?  Lab 02/26/22 ?1345 02/27/22 ?0415  ?WBC 6.4 7.7  ?NEUTROABS 3.5  --   ?HGB 14.0 12.8*  ?HCT 41.9 40.5  ?MCV 95.0 100.0  ?PLT 413* 365  ? ? ?LFT ?Recent Labs  ?Lab 02/27/22 ?0415  ?AST 23  ?ALT 21  ?ALKPHOS 92  ?BILITOT 0.8  ?PROT 6.7  ?ALBUMIN 3.2*  ? ?  ?Antibiotics: ?Anti-infectives (From admission, onward)  ? ? None  ? ?  ? ? ? ?DVT prophylaxis: Lovenox ? ?Code Status: Full code ? ?Family Communication: No family at bedside ? ? ?CONSULTS  ? ? ?Objective  ? ? ?Physical Examination: ? ?General-appears in no acute distress ?Heart-S1-S2, regular, no murmur auscultated ?Lungs-clear to auscultation bilaterally, no wheezing or crackles auscultated ?Abdomen-soft, nontender, no organomegaly ?Extremities-both lower extremities in dressing ?Neuro-alert, oriented x3, no focal deficit noted ? ?Status is: Inpatient:  COPD exacerbation ? ? ?  ? ?Meredeth Ide ?  ?[facility] ?If 7PM-7AM, please contact night-coverage at [email or web address], ?Office  [phone] ? ? ?03/02/2022, 4:52 PM  LOS: 4 days  ? ? ? ? ? ? ? ? ? ? ?  ?

## 2022-03-03 DIAGNOSIS — R778 Other specified abnormalities of plasma proteins: Secondary | ICD-10-CM | POA: Diagnosis not present

## 2022-03-03 DIAGNOSIS — F250 Schizoaffective disorder, bipolar type: Secondary | ICD-10-CM | POA: Diagnosis not present

## 2022-03-03 DIAGNOSIS — R569 Unspecified convulsions: Secondary | ICD-10-CM | POA: Diagnosis not present

## 2022-03-03 DIAGNOSIS — J441 Chronic obstructive pulmonary disease with (acute) exacerbation: Secondary | ICD-10-CM | POA: Diagnosis not present

## 2022-03-03 LAB — BASIC METABOLIC PANEL
Anion gap: 10 (ref 5–15)
BUN: 30 mg/dL — ABNORMAL HIGH (ref 6–20)
CO2: 20 mmol/L — ABNORMAL LOW (ref 22–32)
Calcium: 8.2 mg/dL — ABNORMAL LOW (ref 8.9–10.3)
Chloride: 108 mmol/L (ref 98–111)
Creatinine, Ser: 1.19 mg/dL (ref 0.61–1.24)
GFR, Estimated: >60 mL/min (ref >60)
Glucose, Bld: 198 mg/dL — ABNORMAL HIGH (ref 70–99)
Potassium: 3.7 mmol/L (ref 3.5–5.1)
Sodium: 138 mmol/L (ref 135–145)

## 2022-03-03 LAB — MAGNESIUM: Magnesium: 1.7 mg/dL (ref 1.7–2.4)

## 2022-03-03 LAB — BRAIN NATRIURETIC PEPTIDE: B Natriuretic Peptide: 1173.6 pg/mL — ABNORMAL HIGH (ref 0.0–100.0)

## 2022-03-03 MED ORDER — IPRATROPIUM-ALBUTEROL 0.5-2.5 (3) MG/3ML IN SOLN
3.0000 mL | Freq: Four times a day (QID) | RESPIRATORY_TRACT | Status: DC
  Administered 2022-03-03 – 2022-03-04 (×6): 3 mL via RESPIRATORY_TRACT
  Filled 2022-03-03 (×6): qty 3

## 2022-03-03 MED ORDER — FUROSEMIDE 10 MG/ML IJ SOLN
40.0000 mg | Freq: Two times a day (BID) | INTRAMUSCULAR | Status: DC
  Administered 2022-03-03 – 2022-03-05 (×4): 40 mg via INTRAVENOUS
  Filled 2022-03-03 (×4): qty 4

## 2022-03-03 MED ORDER — ALBUTEROL SULFATE (2.5 MG/3ML) 0.083% IN NEBU
2.5000 mg | INHALATION_SOLUTION | RESPIRATORY_TRACT | Status: DC | PRN
  Administered 2022-03-03 – 2022-03-07 (×2): 2.5 mg via RESPIRATORY_TRACT
  Filled 2022-03-03 (×2): qty 3

## 2022-03-03 MED ORDER — ISOSORB DINITRATE-HYDRALAZINE 20-37.5 MG PO TABS
1.0000 | ORAL_TABLET | Freq: Three times a day (TID) | ORAL | Status: DC
Start: 2022-03-03 — End: 2022-03-07
  Administered 2022-03-03 – 2022-03-07 (×11): 1 via ORAL
  Filled 2022-03-03 (×13): qty 1

## 2022-03-03 NOTE — Progress Notes (Signed)
[facility] ? ?PROGRESS NOTE ? ?[name] [id number]:[id number] DOB: 25-May-1966 DOA: 02/26/2022 ?PCP: Pcp, No ? ? ?Brief HPI:   ? ?56 year old male with medical history of polyneuropathy, chronic combined CHF, CAD, cocaine abuse, hyperlipidemia, hypertension, bipolar disorder, COPD presented with shortness of breath.  Patient also noticed increase swelling in his legs.  He was started on IV Lasix for possible CHF exacerbation. ? ? ?Subjective  ? ?Patient seen and examined, continues to complain of shortness of breath.  Not requiring oxygen. ? ? Assessment/Plan:  ? ? ?COPD exacerbation ?-Patient was started on DuoNeb nebulizer every 6 hours ?-Started on Solu-Medrol 40 mg IV every 12 hours; switch to p.o. prednisone 40 mg daily ?-Mucinex 600 mg p.o. twice daily ? ? ? ?Acute on chronic combined systolic and diastolic CHF ?-BNP was elevated 1573.5 ?-He was started on IV Lasix; Lasix was held due to elevated creatinined  ?-Patient started on  Lasix 40 mg daily ?-Net -6.7 L ?-BNP this morning is 1173 ?-We will consult cardiology for further management of CHF ? ?Acute kidney injury ?-Resolved, creatinine fluctuating, today at baseline 1.19 ? ?Chronic bilateral lower extremity wounds ?-No evidence of draining wounds ?-Wound care consulted for dressing recommendations ? ?Bilateral leg pain/neuropathy ?-Started on gabapentin 300 mg p.o. 3 times daily  ?-Continue Dilaudid 1 mg IV every 4 hours as needed for pain ? ?Elevated troponin ?-Troponin elevated 64, 60 ?-Likely demand ischemia ?-No chest pain ? ?History of cocaine abuse/alcohol abuse ?-Reported use 4 days ago ?-Started on CIWA protocol ? ?CAD ?-Restart home regimen including Coreg, Lipitor ? ?History of seizures ?-Continue Keppra ? ?Schizoaffective/bipolar disorder ?-Continue Zyprexa ? ? ?Medications ? ?  ? atorvastatin  40 mg Oral Daily  ? carvedilol  6.25 mg Oral BID WC  ? enoxaparin (LOVENOX) injection  40 mg Subcutaneous Q24H  ? folic acid  1 mg Oral Daily  ?  furosemide  40 mg Intravenous BID  ? gabapentin  300 mg Oral TID  ? guaiFENesin  600 mg Oral BID  ? ipratropium-albuterol  3 mL Nebulization Q6H  ? levETIRAcetam  500 mg Oral BID  ? multivitamin with minerals  1 tablet Oral Daily  ? OLANZapine  5 mg Oral Daily  ? predniSONE  40 mg Oral Q breakfast  ? thiamine  100 mg Oral Daily  ? Or  ? thiamine  100 mg Intravenous Daily  ? traZODone  25 mg Oral Once  ? ? ? Data Reviewed:  ? ?CBG: ? ?No results for input(s): GLUCAP in the last 168 hours. ? ?SpO2: 100 %  ? ? ?Vitals:  ? 03/03/22 0500 03/03/22 9390 03/03/22 1419 03/03/22 1506  ?BP:  135/80  (!) 142/91  ?Pulse:  85  94  ?Resp:  14  (!) 22  ?Temp:  98.2 ?F (36.8 ?C)  97.9 ?F (36.6 ?C)  ?TempSrc:  Oral  Oral  ?SpO2:  98% 98% 100%  ?Weight: 94.4 kg     ?Height:      ? ? ? ? ?Data Reviewed: ? ?Basic Metabolic Panel: ?Recent Labs  ?Lab 02/27/22 ?0415 02/28/22 ?0410 03/01/22 ?0353 03/02/22 ?0417 03/03/22 ?3009  ?NA 134* 135 136 141 138  ?K 3.8 4.7 5.3* 3.8 3.7  ?CL 101 103 106 107 108  ?CO2 23 24 22 25  20*  ?GLUCOSE 92 96 164* 138* 198*  ?BUN 21* 25* 31* 34* 30*  ?CREATININE 1.37* 1.28* 1.16 1.57* 1.19  ?CALCIUM 8.5* 8.8* 8.5* 8.6* 8.2*  ? ? ?CBC: ?Recent Labs  ?  Lab 02/26/22 ?1345 02/27/22 ?0415  ?WBC 6.4 7.7  ?NEUTROABS 3.5  --   ?HGB 14.0 12.8*  ?HCT 41.9 40.5  ?MCV 95.0 100.0  ?PLT 413* 365  ? ? ?LFT ?Recent Labs  ?Lab 02/27/22 ?0415  ?AST 23  ?ALT 21  ?ALKPHOS 92  ?BILITOT 0.8  ?PROT 6.7  ?ALBUMIN 3.2*  ? ?  ?Antibiotics: ?Anti-infectives (From admission, onward)  ? ? None  ? ?  ? ? ? ?DVT prophylaxis: Lovenox ? ?Code Status: Full code ? ?Family Communication: No family at bedside ? ? ?CONSULTS  ? ? ?Objective  ? ? ?Physical Examination: ? ?General-appears in no acute distress ?Heart-S1-S2, regular, no murmur auscultated ?Lungs-clear to auscultation bilaterally, no wheezing or crackles auscultated ?Abdomen-soft, nontender, no organomegaly ?Extremities-bilateral lower extremities in dressing ?Neuro-alert, oriented x3, no  focal deficit noted ? ?Status is: Inpatient: COPD exacerbation ? ? ?  ? ?Meredeth Ide ?  ?[facility] ?If 7PM-7AM, please contact night-coverage at [email or web address], ?Office  [phone] ? ? ?03/03/2022, 3:35 PM  LOS: 5 days  ? ? ? ? ? ? ? ? ? ? ?  ?

## 2022-03-03 NOTE — Consult Note (Addendum)
?Cardiology Consultation:  ? ?Patient ID: [name] ?MRN: [id number]; DOB: July 26, 1966 ? ?Admit date: 02/26/2022 ?Date of Consult: 03/03/2022 ? ?PCP:  Pcp, No ?  ?[facility] Providers ?Cardiologist:  [name] Breeding, MD  ? ?Patient Profile:  ? ?[name] is a 56 y.o. 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 Percival [name]. ? ?History of Present Illness:  ? ?[name] is a 56 year old male with the above history is followed by Dr. Percival [name].  He has a history of noncompliance and polysubstance abuse with alcohol and cocaine.  We had seen him multiple times in the hospital but he never follows up in the office.  He has 100% no-show rate in our office.  He was initially diagnosed with systolic CHF in [id number] when he was found to have a LVEF of 35-40%.  However, repeat echo in 03/2019 showed significant drop in LVEF to 15%. Prior cardiac catheterization at [facility] in 10/2019 showed diffuse atherosclerosis in all coronary distributions with the worst lesion being 60-70% stenosis in the proximal circumflex.  Degree of cardiomyopathy was felt to be out of proportion to degree of CAD suggesting a nonischemic etiology.  He underwent coronary CTA in 03/2020 which showed moderate to severe CAD.  Functional assessment was recommended at that time; however, he left AMA prior to evaluation.  Nuclear stress test was ordered as an outpatient but was deferred in the setting of wheezing on exam.  He never followed up in clinic after this.  Overall, he has been felt to be a poor candidate for invasive ischemic evaluation due to noncompliance and cocaine abuse.  ? ?Patient was recently admitted  from 01/29/2022 to 02/05/2022 after presenting with slurred speech and dysarthria in the setting of hypertensive urgency.  Brain MRI was negative for stroke.  He was noted to be volume overloaded at that time and was diuresed with IV Lasix.  He was also treated for a COPD exacerbation with steroids.  Echo showed LVEF of less than 20% with mild LVH, grade 3 diastolic dysfunction, and elevated LVEDP.  RV is mildly enlarged with mildly reduced systolic function and moderately elevated PSAP of 47.7 mmHg.  He was counseled extensively on the importance of fixation of cocaine and alcohol use as well as medication compliance.  He ultimately left AMA. He did not receive any medications at discharge but was on Coreg, Bidil, Spironolactone, and Lasix prior to discharge. ? ?Patient presented to the ED on 02/13/2022 for further evaluation of bilateral leg pain for the past 3 days and was noted to have skin breakdown from chronic appearing venous stasis ulcers.  X-ray showed no signs of osteomyelitis.  He was ultimately discharged from the ED because he has been verbally aggressive to the staff. ? ?Patient presented back to the ED on 02/26/2022 for further evaluation of leg swelling and progressive shortness of breath after being seen in the wound care clinic.  Upon arrival to the ED, EKG showed no acute ischemic changes.  High-sensitivity troponin minimally elevated and flat at 64 >> 60.  BNP elevated at 1573.  Chest x-ray showed cardiomegaly with no signs of edema or focal pulmonary consolidation. WBC 6.4, Hgb 14.0, Plts 413. Na 136, K  3.8, Glucose 97, BUN 20, Cr 1.00. Respiratory panel negative for COVID and influenza.  Patient reported using cocaine 4 days prior to admission.  He was admitted for acute on chronic CHF and started on IV Lasix; however, this was stopped and he was transitioned to PO Lasix due to rising creatinine.  Cardiology consulted today for further evaluation. ? ?At time of this evaluation, patient noticeably  short of breath while talking.  He states he is homeless and lives on the streets across from [employer]. He states he was breathing much better when he left the hospital last time.  He did not receive any medications on most recent discharge because he left AMA. He describes progressive shortness of breath and states he lays on the streets with difficulty breathing.  He has a wheelchair but has difficulty getting around with this as he has no one to push him.  He also reports worsening lower extremity edema since being discharged from the hospital.  He still feels short of breath and he is clearly volume overloaded on exam with elevated JVD and abdominal distention. He also has some underlying COPD which is certainly contributing to his shortness of breath. He also reports a "little chest pain" but when asked about this he just goes back to talk about the pain in his feet.  His biggest complaints today are pain in his feet/legs and has shortness of breath.   ? ?Past Medical History:  ?Diagnosis Date  ? Asthma   ? Bipolar 1 disorder ([place])   ? CHF (congestive heart failure) ([place])   ? COPD (chronic obstructive pulmonary disease) ([place])   ? Depression   ? Diabetes mellitus without complication ([place])   ? GERD (gastroesophageal reflux disease)   ? Homelessness   ? Hypertension   ? QT prolongation   ? Schizoaffective disorder ([place])   ? ? ?Past Surgical History:  ?Procedure Laterality Date  ? 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 Procedure: AMPUTATION TOE;  Surgeon: Evelina Bucy, DPM;  Location: [facility];  Service: Podiatry;  Laterality: Bilateral;  Ok to leave patient on hospital bed  ? IR ANGIO INTRA EXTRACRAN SEL COM CAROTID INNOMINATE BILAT MOD SED  12/17/2020  ? IR ANGIO VERTEBRAL SEL VERTEBRAL BILAT MOD SED  12/17/2020  ? RADIOLOGY WITH ANESTHESIA N/A 12/17/2020  ? Procedure: IR WITH ANESTHESIA;  Surgeon: Radiologist, Medication, MD;  Location: [place];  Service: Radiology;  Laterality: N/A;  ?  ? ?Home Medications:   ?Prior to Admission medications   ?Medication Sig Start Date End Date Taking? Authorizing Provider  ?albuterol (VENTOLIN HFA) 108 (90 Base) MCG/ACT inhaler Inhale 2 puffs into the lungs every 6 (six) hours as needed for wheezing or shortness of breath. ?Patient not taking: Reported on 01/31/2022 11/09/21   Wyvonnia Dusky, MD  ?atorvastatin (LIPITOR) 10 MG tablet Take 4 tablets (40 mg total) by mouth daily. ?Patient not taking: Reported on 01/31/2022 11/09/21   Wyvonnia Dusky, MD  ?carvedilol (COREG) 6.25 MG tablet Take 1 tablet (6.25 mg total) by mouth 2 (two) times daily with a meal. ?Patient not taking: Reported on 01/29/2022 12/09/21   [name], MD  ?levETIRAcetam (KEPPRA) 500 MG tablet Take 1 tablet (500 mg total) by mouth 2 (two) times daily. ?Patient not taking: Reported on 01/31/2022 11/09/21 04/05/22  Wyvonnia Dusky, MD  ?OLANZapine (ZYPREXA) 5 MG tablet Take 1 tablet (5 mg total) by mouth daily. ?Patient not taking: Reported on 12/03/2021 11/25/21   Jinny Blossom  Toribio Harbour, NP  ?oxyCODONE (OXY IR/ROXICODONE) 5 MG immediate release tablet Take 1 tablet (5 mg total) by mouth every 6 (six) hours as needed for severe pain or breakthrough pain. ?Patient not taking: Reported on 02/26/2022 12/09/21   [name], MD  ?spironolactone (ALDACTONE) 25 MG tablet Take 1 tablet (25 mg total) by mouth daily. ?Patient not taking: Reported on 01/31/2022 12/09/21   [name], MD  ?umeclidinium bromide (INCRUSE ELLIPTA) 62.5 MCG/INH AEPB Inhale 1 puff into the lungs daily. ?Patient not taking: Reported on 11/24/2021 11/16/20   Flora Lipps, MD  ? ? ?Inpatient Medications: ?Scheduled Meds: ? atorvastatin  40 mg Oral Daily  ? carvedilol  6.25 mg Oral BID WC  ? enoxaparin (LOVENOX) injection  40 mg Subcutaneous [id number]  ? folic acid  1 mg Oral Daily  ? furosemide  40 mg Oral Daily  ? gabapentin  300 mg Oral TID  ? guaiFENesin  600 mg Oral BID  ? ipratropium-albuterol  3 mL Nebulization Q6H  ? levETIRAcetam  500 mg Oral BID  ?  multivitamin with minerals  1 tablet Oral Daily  ? OLANZapine  5 mg Oral Daily  ? predniSONE  40 mg Oral Q breakfast  ? thiamine  100 mg Oral Daily  ? Or  ? thiamine  100 mg Intravenous Daily  ? traZODone  25 mg O

## 2022-03-03 NOTE — Progress Notes (Signed)
Orthopedic Tech Progress Note ?Patient Details:  ?[name] ?[birth year] ?[id number] ? ?Patient ID: [name], male   DOB: 11-Oct-1966, 56 y.o.   MRN: [id number] ? ?[name] ?03/03/2022, 7:39 PM ?Attempted to apply unna boots after RN removed the old ones but patient has multiple open wounds and sores. Patient stated that his legs did not look like that before getting his unna boots applied last Thursday. Waiting to see what the doctor or wound care nurse advises. ?

## 2022-03-04 DIAGNOSIS — R778 Other specified abnormalities of plasma proteins: Secondary | ICD-10-CM | POA: Diagnosis not present

## 2022-03-04 DIAGNOSIS — J441 Chronic obstructive pulmonary disease with (acute) exacerbation: Secondary | ICD-10-CM | POA: Diagnosis not present

## 2022-03-04 DIAGNOSIS — R569 Unspecified convulsions: Secondary | ICD-10-CM | POA: Diagnosis not present

## 2022-03-04 DIAGNOSIS — F250 Schizoaffective disorder, bipolar type: Secondary | ICD-10-CM | POA: Diagnosis not present

## 2022-03-04 LAB — LIPID PANEL
Cholesterol: 109 mg/dL (ref 0–200)
HDL: 55 mg/dL (ref >40)
LDL Cholesterol: 45 mg/dL (ref 0–99)
Total CHOL/HDL Ratio: 2 RATIO
Triglycerides: 46 mg/dL (ref <150)
VLDL: 9 mg/dL (ref 0–40)

## 2022-03-04 LAB — BASIC METABOLIC PANEL
Anion gap: 9 (ref 5–15)
BUN: 26 mg/dL — ABNORMAL HIGH (ref 6–20)
CO2: 28 mmol/L (ref 22–32)
Calcium: 8.4 mg/dL — ABNORMAL LOW (ref 8.9–10.3)
Chloride: 105 mmol/L (ref 98–111)
Creatinine, Ser: 1.27 mg/dL — ABNORMAL HIGH (ref 0.61–1.24)
GFR, Estimated: >60 mL/min (ref >60)
Glucose, Bld: 164 mg/dL — ABNORMAL HIGH (ref 70–99)
Potassium: 3.5 mmol/L (ref 3.5–5.1)
Sodium: 142 mmol/L (ref 135–145)

## 2022-03-04 MED ORDER — MUPIROCIN CALCIUM 2 % EX CREA
TOPICAL_CREAM | Freq: Every day | CUTANEOUS | Status: DC
  Filled 2022-03-04: qty 15

## 2022-03-04 MED ORDER — IPRATROPIUM-ALBUTEROL 0.5-2.5 (3) MG/3ML IN SOLN
3.0000 mL | Freq: Three times a day (TID) | RESPIRATORY_TRACT | Status: DC
  Administered 2022-03-05 – 2022-03-07 (×4): 3 mL via RESPIRATORY_TRACT
  Filled 2022-03-04 (×6): qty 3

## 2022-03-04 MED ORDER — PREDNISONE 20 MG PO TABS
20.0000 mg | ORAL_TABLET | Freq: Every day | ORAL | Status: DC
  Administered 2022-03-05 – 2022-03-07 (×3): 20 mg via ORAL
  Filled 2022-03-04 (×3): qty 1

## 2022-03-04 MED ORDER — MORPHINE SULFATE (PF) 2 MG/ML IV SOLN
2.0000 mg | INTRAVENOUS | Status: DC | PRN
  Administered 2022-03-04 – 2022-03-07 (×15): 2 mg via INTRAVENOUS
  Filled 2022-03-04 (×15): qty 1

## 2022-03-04 NOTE — Plan of Care (Signed)
  Problem: Education: Goal: Ability to demonstrate management of disease process will improve Outcome: Progressing Goal: Ability to verbalize understanding of medication therapies will improve Outcome: Progressing Goal: Individualized Educational Video(s) Outcome: Progressing   Problem: Activity: Goal: Capacity to carry out activities will improve Outcome: Progressing   Problem: Cardiac: Goal: Ability to achieve and maintain adequate cardiopulmonary perfusion will improve Outcome: Progressing   

## 2022-03-04 NOTE — Consult Note (Addendum)
WOC Nurse Consult Note: ?Initial WOC consult was preformed on 3/30 and Una boots were ordered at that time for bilat legs. Upon removal yesterday, patient states wounds have declined and he does not want the Una boots reapplied.  Requested to provide topical treatment recommendations for BLE.  ?Wound type: Left leg with intact skin and no edema.   ?Left inner ankle with full thickness wound, red and dry, .5X.5X.2cm ?Left outer ankle with full thickness wound in patchy areas; red and dry, 1X1X.2cm ?Right leg with moderate edema and patchy areas of full thickness wounds to inner anterior calf; affected area is approx 10X5X.2cm, red and dry ?Right anterior foot with full thickness skin loss; .5X.5X.2cm, red and dry ?Right outer leg with patchy areas of full thickness skin loss; .3X3X.2cm, .5X.5X.2cm, /3X.3X.1cm, red and dry ?Dressing procedure/placement/frequency: Topical treatment orders provided for bedside nurses to perform as follows:  ?1. Apply Bactroban to left inner and outer ankle Q day, then cover with foam dressing.  (Change foam dressing Q 3 days or PRN soiling.) ?2. Apply xerform gauze to right leg Q day, then cover with ABD pads and kerlex. ?3. Foam dressing to right anterior foot, change Q 3 days or PRN soiling. ?Please re-consult if further assistance is needed.  Thank-you,  ?Cammie Mcgee MSN, RN, CWOCN, [place], CNS ?[phone]  ? ?  ?

## 2022-03-04 NOTE — TOC Progression Note (Signed)
Transition of Care (TOC) - Progression Note  ? ? ?Patient Details  ?Name: [name] ?MRN: [id number] ?Date of Birth: Aug 02, 1966 ? ?Transition of Care (TOC) CM/SW Contact  ?Geni Bers, RN ?Phone Number: ?03/04/2022, 1:58 PM ? ?Clinical Narrative:    ?Spoke with pt concerning discharge plans. Pt is asking to go to SNF/ALF. Explained to pt that we will try. PT will need to evaluate.  ? ? ?Expected Discharge Plan: Homeless Shelter ?Barriers to Discharge: No Barriers Identified ? ?Expected Discharge Plan and Services ?Expected Discharge Plan: Homeless Shelter ?  ?  ?  ?Living arrangements for the past 2 months: Homeless ?                ?  ?  ?  ?  ?  ?  ?  ?  ?  ?  ? ? ?Social Determinants of Health (SDOH) Interventions ?  ? ?Readmission Risk Interventions ? ?  02/04/2022  ?  4:41 PM 12/05/2021  ? 11:48 AM 03/05/2021  ?  2:42 PM  ?Readmission Risk Prevention Plan  ?Transportation Screening Complete Complete Complete  ?Medication Review Oceanographer) Complete Not Complete Complete  ?PCP or Specialist appointment within 3-5 days of discharge Complete Not Complete Complete  ?HRI or Home Care Consult Complete Not Complete Complete  ?SW Recovery Care/Counseling Consult Complete Not Complete Complete  ?Palliative Care Screening Not Applicable Not Applicable Not Applicable  ?Skilled Nursing Facility Complete Not Applicable Complete  ? ? ?

## 2022-03-04 NOTE — Progress Notes (Addendum)
? ?Progress Note ? ?Patient Name: [name] ?Date of Encounter: 03/04/2022 ? ?[facility] Cardiologist: Minus Breeding, MD  ? ?Subjective  ? ?Patient continues to have SOB, even when just moving around in the bed. Denies any chest pain, palpitations. Still feels like his ankles are swollen.  ? ?Inpatient Medications  ?  ?Scheduled Meds: ? atorvastatin  40 mg Oral Daily  ? enoxaparin (LOVENOX) injection  40 mg Subcutaneous [id number]  ? folic acid  1 mg Oral Daily  ? furosemide  40 mg Intravenous BID  ? gabapentin  300 mg Oral TID  ? guaiFENesin  600 mg Oral BID  ? ipratropium-albuterol  3 mL Nebulization Q6H  ? isosorbide-hydrALAZINE  1 tablet Oral TID  ? levETIRAcetam  500 mg Oral BID  ? multivitamin with minerals  1 tablet Oral Daily  ? mupirocin cream   Topical Daily  ? OLANZapine  5 mg Oral Daily  ? predniSONE  40 mg Oral Q breakfast  ? thiamine  100 mg Oral Daily  ? Or  ? thiamine  100 mg Intravenous Daily  ? traZODone  25 mg Oral Once  ? ?Continuous Infusions: ? ?PRN Meds: ?albuterol, hydrALAZINE, HYDROmorphone (DILAUDID) injection  ? ?Vital Signs  ?  ?Vitals:  ? 03/04/22 0500 03/04/22 0556 03/04/22 0805 03/04/22 1402  ?BP:  (!) 166/100  (!) 161/111  ?Pulse:  100  (!) 102  ?Resp:  18  20  ?Temp:  99.2 ?F (37.3 ?C)  98.2 ?F (36.8 ?C)  ?TempSrc:  Oral  Oral  ?SpO2:  100% 96% 98%  ?Weight: 94.6 kg     ?Height:      ? ? ?Intake/Output Summary (Last 24 hours) at 03/04/2022 1505 ?Last data filed at 03/04/2022 1431 ?Gross per 24 hour  ?Intake 1440 ml  ?Output 6350 ml  ?Net -4910 ml  ? ? ?  03/04/2022  ?  5:00 AM 03/03/2022  ?  5:00 AM 03/02/2022  ?  5:00 AM  ?Last 3 Weights  ?Weight (lbs) 208 lb 8.9 oz 208 lb 1.8 oz 207 lb 10.8 oz  ?Weight (kg) 94.6 kg 94.4 kg 94.2 kg  ?   ? ?Telemetry  ?  ?Not on telemetry  - Personally Reviewed ? ?ECG  ?  ?No new tracings - Personally Reviewed ? ?Physical Exam  ? ?GEN: No acute distress.  Laying comfortably in the bed ?Neck: No JVD ?Cardiac: RRR, no murmurs, rubs, or gallops.  ?Respiratory:  Diminished breath sounds throughout. Increased WOB while talking.  ?GI: Soft, nontender, non-distended  ?MS: No edema. Bilateral lower extremities wrapped. 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 ?Labs  ?  ?High Sensitivity Troponin:   ?Recent Labs  ?Lab 02/26/22 ?1345 02/26/22 ?1626  ?TROPONINIHS 64* 60*  ?   ?Chemistry ?Recent Labs  ?Lab 02/27/22 ?0415 02/28/22 ?0410 03/02/22 ?0417 03/03/22 ?[id number] 03/03/22 ?1650 03/04/22 ?0335  ?NA 134*   < > 141 138  --  142  ?K 3.8   < > 3.8 3.7  --  3.5  ?CL 101   < > 107 108  --  105  ?CO2 23   < > 25 20*  --  28  ?GLUCOSE 92   < > 138* 198*  --  164*  ?BUN 21*   < > 34* 30*  --  26*  ?CREATININE 1.37*   < > 1.57* 1.19  --  1.27*  ?CALCIUM 8.5*   < >  8.6* 8.2*  --  8.4*  ?MG  --   --   --   --  1.7  --   ?PROT 6.7  --   --   --   --   --   ?ALBUMIN 3.2*  --   --   --   --   --   ?AST 23  --   --   --   --   --   ?ALT 21  --   --   --   --   --   ?ALKPHOS 92  --   --   --   --   --   ?BILITOT 0.8  --   --   --   --   --   ?GFRNONAA >60   < > 52* >60  --  >60  ?ANIONGAP 10   < > 9 10  --  9  ? < > = values in this interval not displayed.  ?  ?Lipids  ?Recent Labs  ?Lab 03/04/22 ?0335  ?CHOL 109  ?TRIG 46  ?HDL 55  ?[place] 45  ?CHOLHDL 2.0  ?  ?Hematology ?Recent Labs  ?Lab 02/26/22 ?1345 02/27/22 ?0415  ?WBC 6.4 7.7  ?RBC 4.41 4.05*  ?HGB 14.0 12.8*  ?HCT 41.9 40.5  ?MCV 95.0 100.0  ?MCH 31.7 31.6  ?MCHC 33.4 31.6  ?RDW 14.6 14.6  ?PLT 413* 365  ? ?Thyroid No results for input(s): TSH, FREET4 in the last 168 hours.  ?BNP ?Recent Labs  ?Lab 02/26/22 ?1345 03/03/22 ?[id number]  ?BNP 1,573.5* 1,173.6*  ?  ?DDimer No results for input(s): DDIMER in the last 168 hours.  ? ?Radiology  ?  ?No results found. ? ?Cardiac Studies  ? ?Echocardiogram 01/29/22  ?1. LV apical false tendon (normal variant). Left ventricular ejection  ?fraction, by estimation, is <20%. The left ventricle has severely  ?decreased function. The left  ventricle demonstrates global hypokinesis.  ?The left ventricular internal cavity size was  ? mildly dilated. There is mild left ventricular hypertrophy. Left  ?ventricular diastolic parameters are consistent with Grade III diastolic  ?dysfunction (restrictive). Elevated left ventricular end-diastolic  ?pressure. The E/e' is 22.  ? 2. Right ventricular systolic function is mildly reduced. The right  ?ventricular size is mildly enlarged. There is moderately elevated  ?pulmonary artery systolic pressure. The estimated right ventricular  ?systolic pressure is [id number] mmHg.  ? 3. Left atrial size was moderately dilated.  ? 4. Right atrial size was moderately dilated.  ? 5. The mitral valve is abnormal. Mild to moderate mitral valve  ?regurgitation.  ? 6. The aortic valve is tricuspid. Aortic valve regurgitation is not  ?visualized. Aortic valve sclerosis is present, with no evidence of aortic  ?valve stenosis.  ? 7. Mildly dilated pulmonary artery.  ? 8. The inferior vena cava is normal in size with <50% respiratory  ?variability, suggesting right atrial pressure of 8 mmHg.  ? ?Comparison(s): Changes from prior study are noted. 12/18/2020: LVEF <20%.  ? ?Patient Profile  ?   ?56 y.o. 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 Percival Spanish. ? ?Assessment & Plan  ?  ?Acute on Chronic Combined CHF  ?-  Most recent echocardiogram on 01/29/22 showed LVEF <20% with mild LVH, grade III diastolic dysfunction, elevated LVEDP.  ?- Patient has a 100% no show rate in our office and has left AMA multiple times before picking up GDMT  ?- BNP elevated to 1573  ?- CXR showed cardiomegaly but no overt  edema  ?- Diuresing on lasix 40 mg BID. Output 7.75 L fluids yesterday. Currently net -11.125 L since admission  ?- Holding carvedilol due to concerns of low output. Plan to restart after adequate diuresis   ?- Started Bidil 20-37.5 mg TID  ?- Creatinine 1.27, hold off on ARB or entresto for now. BP could likely tolerate, so we plan to start as renal function improves  ?- Social work is following and is trying to help find SNF but it sounds like no SNF has agreed to take him so far. Medication compliance going to continue to be a large issue  ? ?CAD  ?- Prior cath at [facility] in 2020 showed moderate non-obstructive CAD. Coronary CTA in 03/2020 showed moderate to severe disease of RCA, LAD, and Lcx. However patient is not a candidate for invasive ischemic eval due to noncompliance and cocaine use  ?- hsTn 64>>60. Minimal and flat elevation consistent with demand ischemia  ?- EKG without changes  ?- Patient allergic to aspirin ?- Continue lipitor 40 mg daily  ? ?HTN  ?- BP elevated overnight  ?- Continue bidil  ?- Continue diuresis with lasix  ?- Plan to start losartan or entresto as renal funciton allows.  ?  ?Otherwise per primary  ?- Polysubstance abuse (cocaine, tobacco, alcohol abuse)  ?- COPD  ?- Schizoaffective disorder  ?- Homelessness  ?- History of seizures  ?   ? ?For questions or updates, please contact [place] ?Please consult [email or web address] for contact info under  ? ?  ?   ?Signed, ?Margie Billet, PA-C  ?03/04/2022, 3:05 PM   ? ?History and all data above reviewed.  Patient examined.  I agree with the findings as above.  Breathing is better but not at baseline.  Complaining mostly of foot pain not controlled with current meds. The patient exam reveals COR:RRR  ,  Lungs: Decreased breath sounds  ,  Abd: Positive bowel sounds, no rebound no guarding, Ext No edema  .  All available labs, radiology testing, previous records reviewed. Agree with documented assessment and plan.  Acute on chronic systolic  HF:  Neck veins are still up and still with tachypnea.  Good diuresis but needs continued IV diuresis.    Minus Breeding  4:37 PM  03/04/2022 ? ?

## 2022-03-04 NOTE — Progress Notes (Addendum)
[facility] ? ?PROGRESS NOTE ? ?Lavaughn Haberle PPJ:[id number] DOB: 04-11-1966 DOA: 02/26/2022 ?PCP: Pcp, No ? ? ?Brief HPI:   ? ?56 year old male with medical history of polyneuropathy, chronic combined CHF, CAD, cocaine abuse, hyperlipidemia, hypertension, bipolar disorder, COPD presented with shortness of breath.  Patient also noticed increase swelling in his legs.  He was started on IV Lasix for possible CHF exacerbation. ? ? ?Subjective  ? ?Patient seen, has been angry that he is not getting enough pain medication.  Complains of pain in the lower extremities.  Had a friend Child psychotherapist sitting in the room, patient started yelling at me and did not let me talk to social worker in the room.   ? ? Assessment/Plan:  ? ? ?COPD exacerbation ?-Patient was started on DuoNeb nebulizer every 6 hours ?-Started on Solu-Medrol 40 mg IV every 12 hours; switched to p.o. prednisone 40 mg daily ?-We will change prednisone to 20 mg p.o. daily from tomorrow morning ?-Mucinex 600 mg p.o. twice daily ? ? ? ?Acute on chronic combined systolic and diastolic CHF ?-BNP was elevated 1573.5 ?-He was started on IV Lasix; Lasix was held due to elevated creatinined  ?-Patient started on  Lasix 40 mg daily ?-BNP was elevated at 1173, cardiology was consulted ?-P.o. Lasix was changed to IV Lasix 40 mg every 12 hours ?-Had excellent diuretic response with IV Lasix ?-Net -11.1 L ?-Net -6.7 L ? ? ?Acute kidney injury ?-Resolved, creatinine fluctuating ?-Today creatinine is 1.27 ? ?Chronic bilateral lower extremity wounds ?-No evidence of draining wounds ?-Wound care consulted for dressing recommendations ? ?Bilateral leg pain/neuropathy ?-Started on gabapentin 300 mg p.o. 3 times daily  ?-Continue Dilaudid 1 mg IV every 4 hours as needed for pain ? ?Elevated troponin ?-Troponin elevated 64, 60 ?-Likely demand ischemia ?-No chest pain ? ?History of cocaine abuse/alcohol abuse ?-Reported use 4 days ago ?-Started on CIWA  protocol ? ?CAD ?-Restart home regimen including Coreg, Lipitor ? ?History of seizures ?-Continue Keppra ? ?Schizoaffective/bipolar disorder ?-Continue Zyprexa ? ? ?Medications ? ?  ? atorvastatin  40 mg Oral Daily  ? enoxaparin (LOVENOX) injection  40 mg Subcutaneous Q24H  ? folic acid  1 mg Oral Daily  ? furosemide  40 mg Intravenous BID  ? gabapentin  300 mg Oral TID  ? guaiFENesin  600 mg Oral BID  ? ipratropium-albuterol  3 mL Nebulization Q6H  ? isosorbide-hydrALAZINE  1 tablet Oral TID  ? levETIRAcetam  500 mg Oral BID  ? multivitamin with minerals  1 tablet Oral Daily  ? mupirocin cream   Topical Daily  ? OLANZapine  5 mg Oral Daily  ? predniSONE  40 mg Oral Q breakfast  ? thiamine  100 mg Oral Daily  ? Or  ? thiamine  100 mg Intravenous Daily  ? traZODone  25 mg Oral Once  ? ? ? Data Reviewed:  ? ?CBG: ? ?No results for input(s): GLUCAP in the last 168 hours. ? ?SpO2: 98 %  ? ? ?Vitals:  ? 03/04/22 0500 03/04/22 0556 03/04/22 0805 03/04/22 1402  ?BP:  (!) 166/100  (!) 161/111  ?Pulse:  100  (!) 102  ?Resp:  18  20  ?Temp:  99.2 ?F (37.3 ?C)  98.2 ?F (36.8 ?C)  ?TempSrc:  Oral  Oral  ?SpO2:  100% 96% 98%  ?Weight: 94.6 kg     ?Height:      ? ? ? ? ?Data Reviewed: ? ?Basic Metabolic Panel: ?Recent Labs  ?Lab 02/28/22 ?  0410 03/01/22 ?9242 03/02/22 ?6834 03/03/22 ?1962 03/03/22 ?1650 03/04/22 ?0335  ?NA 135 136 141 138  --  142  ?K 4.7 5.3* 3.8 3.7  --  3.5  ?CL 103 106 107 108  --  105  ?CO2 24 22 25  20*  --  28  ?GLUCOSE 96 164* 138* 198*  --  164*  ?BUN 25* 31* 34* 30*  --  26*  ?CREATININE 1.28* 1.16 1.57* 1.19  --  1.27*  ?CALCIUM 8.8* 8.5* 8.6* 8.2*  --  8.4*  ?MG  --   --   --   --  1.7  --   ? ? ?CBC: ?Recent Labs  ?Lab 02/26/22 ?1345 02/27/22 ?0415  ?WBC 6.4 7.7  ?NEUTROABS 3.5  --   ?HGB 14.0 12.8*  ?HCT 41.9 40.5  ?MCV 95.0 100.0  ?PLT 413* 365  ? ? ?LFT ?Recent Labs  ?Lab 02/27/22 ?0415  ?AST 23  ?ALT 21  ?ALKPHOS 92  ?BILITOT 0.8  ?PROT 6.7  ?ALBUMIN 3.2*  ? ?  ?Antibiotics: ?Anti-infectives (From  admission, onward)  ? ? None  ? ?  ? ? ? ?DVT prophylaxis: Lovenox ? ?Code Status: Full code ? ?Family Communication: No family at bedside ? ? ?CONSULTS  ? ? ?Objective  ? ? ?Physical Examination: ? ?I did not examine the patient today as he was yelling ? ?Status is: Inpatient: COPD exacerbation ? ? ?  ? ?03/01/22 ?  ?[facility] ?If 7PM-7AM, please contact night-coverage at [email or web address], ?Office  [phone] ? ? ?03/04/2022, 3:30 PM  LOS: 6 days  ? ? ? ? ? ? ? ? ? ? ?  ?

## 2022-03-05 ENCOUNTER — Encounter (HOSPITAL_BASED_OUTPATIENT_CLINIC_OR_DEPARTMENT_OTHER): Payer: Medicaid Other | Admitting: Internal Medicine

## 2022-03-05 DIAGNOSIS — J441 Chronic obstructive pulmonary disease with (acute) exacerbation: Secondary | ICD-10-CM | POA: Diagnosis not present

## 2022-03-05 DIAGNOSIS — F1420 Cocaine dependence, uncomplicated: Secondary | ICD-10-CM | POA: Diagnosis not present

## 2022-03-05 DIAGNOSIS — R778 Other specified abnormalities of plasma proteins: Secondary | ICD-10-CM | POA: Diagnosis not present

## 2022-03-05 DIAGNOSIS — I5043 Acute on chronic combined systolic (congestive) and diastolic (congestive) heart failure: Secondary | ICD-10-CM | POA: Diagnosis not present

## 2022-03-05 LAB — BASIC METABOLIC PANEL
Anion gap: 10 (ref 5–15)
BUN: 26 mg/dL — ABNORMAL HIGH (ref 6–20)
CO2: 31 mmol/L (ref 22–32)
Calcium: 8.4 mg/dL — ABNORMAL LOW (ref 8.9–10.3)
Chloride: 102 mmol/L (ref 98–111)
Creatinine, Ser: 1.37 mg/dL — ABNORMAL HIGH (ref 0.61–1.24)
GFR, Estimated: >60 mL/min (ref >60)
Glucose, Bld: 166 mg/dL — ABNORMAL HIGH (ref 70–99)
Potassium: 3.3 mmol/L — ABNORMAL LOW (ref 3.5–5.1)
Sodium: 143 mmol/L (ref 135–145)

## 2022-03-05 MED ORDER — GABAPENTIN 400 MG PO CAPS
400.0000 mg | ORAL_CAPSULE | Freq: Three times a day (TID) | ORAL | Status: DC
  Administered 2022-03-05 – 2022-03-07 (×5): 400 mg via ORAL
  Filled 2022-03-05 (×5): qty 1

## 2022-03-05 MED ORDER — SPIRONOLACTONE 12.5 MG HALF TABLET
12.5000 mg | ORAL_TABLET | Freq: Every day | ORAL | Status: DC
  Administered 2022-03-05 – 2022-03-07 (×3): 12.5 mg via ORAL
  Filled 2022-03-05 (×3): qty 1

## 2022-03-05 MED ORDER — FUROSEMIDE 10 MG/ML IJ SOLN
40.0000 mg | INTRAMUSCULAR | Status: DC
  Administered 2022-03-05 – 2022-03-07 (×4): 40 mg via INTRAVENOUS
  Filled 2022-03-05 (×4): qty 4

## 2022-03-05 MED ORDER — MORPHINE SULFATE (PF) 2 MG/ML IV SOLN
1.0000 mg | Freq: Once | INTRAVENOUS | Status: AC
  Administered 2022-03-05: 1 mg via INTRAVENOUS
  Filled 2022-03-05: qty 1

## 2022-03-05 MED ORDER — POTASSIUM CHLORIDE CRYS ER 20 MEQ PO TBCR
40.0000 meq | EXTENDED_RELEASE_TABLET | Freq: Two times a day (BID) | ORAL | Status: AC
  Administered 2022-03-05 (×2): 40 meq via ORAL
  Filled 2022-03-05 (×2): qty 2

## 2022-03-05 NOTE — Progress Notes (Signed)
Pt refused wound care. Educated pt. Asked if I could at least secure dressing to foot. Pt still refused. ? ?

## 2022-03-05 NOTE — Evaluation (Signed)
Physical Therapy Evaluation ?Patient Details ?Name: [name] ?MRN: [id number] ?DOB: 11/07/1966 ?Today's Date: 03/05/2022 ? ?History of Present Illness ? 56 y.o. male with medical history significant of polyneuropathy, chronic combined HF, CAD, cocaine abuse, HLD, HTN, bipolar disorder. Presenting with orthopnea. Dx of acute on chronic CHF. Recent admission at [facility] 3/1-02/05/22 with hypertensive emergency.  ?Clinical Impression ? Pt admitted with above diagnosis. Pt reports he's not able to bear weight through B feet due to "12/10" pain, standing was not attempted. Prior to admission he was receiving assistance for transfers in/out of his WC. Pt was able to perform supine to sit without assistance. SNF recommended. Pt currently with functional limitations due to the deficits listed below (see PT Problem List). Pt will benefit from skilled PT to increase their independence and safety with mobility to allow discharge to the venue listed below.   ?   ?   ? ?Recommendations for follow up therapy are one component of a multi-disciplinary discharge planning process, led by the attending physician.  Recommendations may be updated based on patient status, additional functional criteria and insurance authorization. ? ?Follow Up Recommendations Skilled nursing-short term rehab (<3 hours/day) ? ?  ?Assistance Recommended at Discharge Intermittent Supervision/Assistance  ?Patient can return home with the following ? Help with stairs or ramp for entrance;Assist for transportation;Assistance with cooking/housework;Direct supervision/assist for medications management;A little help with bathing/dressing/bathroom;Two people to help with walking and/or transfers ? ?  ?Equipment Recommendations None recommended by PT  ?Recommendations for Other Services ?    ?  ?Functional Status Assessment Patient has had a recent decline in their functional status and demonstrates the ability to make significant improvements in function in a reasonable  and predictable amount of time.  ? ?  ?Precautions / Restrictions Precautions ?Precautions: Fall ?Precaution Comments: bil feet and lower leg wounds ?Restrictions ?Weight Bearing Restrictions: No  ? ?  ? ?Mobility ? Bed Mobility ?Overal bed mobility: Modified Independent ?Bed Mobility: Supine to Sit ?  ?  ?Supine to sit: Modified independent (Device/Increase time) ?  ?  ?General bed mobility comments: pt used bedrail ?  ? ?Transfers ?  ?  ?  ?  ?  ?  ?  ?  ?  ?General transfer comment: deferred, pt stated he's not able to bear weight thru BLEs,  had someone lifting him into his WC prior to admission ?  ? ?Ambulation/Gait ?  ?  ?  ?  ?  ?  ?  ?General Gait Details: unable 2* B foot pain ? ?Stairs ?  ?  ?  ?  ?  ? ?Wheelchair Mobility ?  ? ?Modified Rankin (Stroke Patients Only) ?  ? ?  ? ?Balance Overall balance assessment: Needs assistance ?Sitting-balance support: No upper extremity supported, Feet supported ?Sitting balance-Leahy Scale: Fair ?  ?  ?  ?  ?  ?  ?  ?  ?  ?  ?  ?  ?  ?  ?  ?  ?   ? ? ? ?Pertinent Vitals/Pain Pain Assessment ?Pain Score: 10-Worst pain ever ?Pain Location: B feet ?Pain Descriptors / Indicators: Grimacing, Guarding ?Pain Intervention(s): Limited activity within patient's tolerance, Monitored during session, Premedicated before session, Repositioned  ? ? ?Home Living Family/patient expects to be discharged to:: Shelter/Homeless ?  ?  ?  ?  ?  ?  ?  ?  ?  ?Additional Comments: pt stated he's been living outside  ?  ?Prior Function Prior Level of Function : Needs  assist ?  ?  ?  ?  ?  ?  ?Mobility Comments: from wc level.  Has assist  to transfer from [facility] <> ground for sleeping ?ADLs Comments: uses baby wipes for bathing, gets food from hand outs ?  ? ? ?Hand Dominance  ? 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 ?  ?Extremity/Trunk Assessment  ? 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 Pt able to elevate RUE to ~110* but was tremulous/cogwheel movement. Pt able to grip my fingers with 4/5 strength ?RUE Coordination: decreased gross motor ?  ? ?Lower Extremity Assessment ?RLE Deficits / Details: bil foot wounds, pt able to lift leg against gravity supine but declined further function or assessment ?LLE Deficits / Details: bil foot wounds, pt able to lift leg against gravity supine but declined further function or assessment ?  ? ?Cervical / Trunk Assessment ?Cervical / Trunk Assessment: Normal  ?Communication  ? Communication: Expressive difficulties (speech somewhat slurred)  ?Cognition Arousal/Alertness: Awake/alert ?Behavior During Therapy: [facility] for tasks assessed/performed ?Overall Cognitive Status: Within Functional Limits for tasks assessed ?  ?  ?  ?  ?  ?  ?  ?  ?  ?  ?  ?  ?  ?  ?  ?  ?  ?  ?  ? ?  ?General Comments   ? ?  ?Exercises    ? ?Assessment/Plan  ?  ?PT Assessment Patient needs continued PT services  ?PT Problem List Decreased strength;Decreased mobility;Decreased range of motion;Decreased coordination;Decreased activity tolerance;Decreased skin integrity;Pain;Decreased knowledge of use of DME ? ?   ?  ?PT Treatment Interventions     ? ?PT Goals (Current goals can be found in the Care Plan section)  ?Acute Rehab PT Goals ?Patient Stated Goal: decrease pain ?PT Goal Formulation: With patient ?Time For Goal Achievement: 03/19/22 ?Potential to Achieve Goals: Fair ? ?  ?Frequency Min 2X/week ?  ? ? ?Co-evaluation   ?  ?  ?  ?  ? ? ?  ?AM-PAC PT "6 Clicks" Mobility  ?Outcome Measure Help needed turning from your back to your side while in a flat bed without using bedrails?: A Little ?Help needed moving from lying on your back to sitting on the side of a flat bed without using bedrails?: A Little ?Help needed moving to and from a bed to a chair (including  a wheelchair)?: Total ?Help needed standing up from a chair using your arms (e.g., wheelchair or bedside chair)?: Total ?Help needed to walk in hospital room?: Total ?Help needed climbing 3-5 steps with a railing? : Total ?6 Click Score: 10 ? ?  ?End of Session   ?Activity Tolerance: Patient limited by pain ?Patient left: in bed;with bed alarm set;with call bell/phone within reach ?Nurse Communication: Mobility status;Need for lift equipment ?PT Visit Diagnosis: Other abnormalities of gait and mobility (R26.89);Difficulty in walking, not elsewhere classified (R26.2);Muscle weakness (generalized) (M62.81) ?  ? ?Time: 4268-3419 ?PT Time Calculation (min) (ACUTE ONLY): 13 min ? ? ?Charges:   PT Evaluation ?$PT Eval Moderate Complexity: 1 Mod ?  ?  ?   ? ? ?Ralene Bathe Kistler PT 03/05/2022  ?Acute Rehabilitation Services ?Pager [phone] ?Office [phone] ? ? ?

## 2022-03-05 NOTE — Plan of Care (Signed)
°  Problem: Education: °Goal: Ability to demonstrate management of disease process will improve °Outcome: Progressing °Goal: Ability to verbalize understanding of medication therapies will improve °Outcome: Progressing °Goal: Individualized Educational Video(s) °Outcome: Progressing °  °

## 2022-03-05 NOTE — Progress Notes (Addendum)
Pt refused wound care. Pt dressings are coming off due to pt moving around in bed, attempt to reason with pt to educate why dressing are not staying in place and can attempt other options to secure his outer dressing to his wounds. Pt refused care and instructed me(writer) to leave the room now and that he does not feel up to it right now. Will continue to try wound care before shift ends. SRP RN ?

## 2022-03-05 NOTE — Progress Notes (Signed)
[facility] ? ?PROGRESS NOTE ? ?[name] NKN:[id number] DOB: [birth year] DOA: 02/26/2022 ?PCP: Pcp, No ? ? ?Brief HPI:   ? ?56 year old male with medical history of polyneuropathy, chronic combined CHF, CAD, cocaine abuse, hyperlipidemia, hypertension, bipolar disorder, COPD presented with shortness of breath.  Patient also noticed increase swelling in his legs.  He was started on IV Lasix for possible CHF exacerbation. ? ? ?Subjective  ? ?Patient seen and examined, still complains of shortness of breath.  Diuresing well with IV Lasix. ? ? Assessment/Plan:  ? ? ?COPD exacerbation ?-Patient was started on DuoNeb nebulizer every 8 hours ?-Started on Solu-Medrol 40 mg IV every 12 hours; switched to p.o. prednisone 40 mg daily ?-Prednisone has been changed to 20 mg p.o. daily ?-Mucinex 600 mg p.o. twice daily ? ?Acute on chronic combined systolic and diastolic CHF ?-BNP was elevated 1573.5 ?-He was started on IV Lasix; Lasix was held due to elevated creatinine ?-Patient started on  Lasix 40 mg daily ?-BNP was elevated at 1173, cardiology was consulted ?-P.o. Lasix was changed to IV Lasix 40 mg every 12 hours ?-Had excellent diuretic response with IV Lasix ?-Continue BiDil, Aldactone ?-Net -15.7 L ? ? ?Acute kidney injury ?-Resolved, creatinine fluctuating ?-Today creatinine is 1.37 ? ?Chronic bilateral lower extremity wounds ?-No evidence of draining wounds ?-Wound care consulted for dressing recommendations ? ?Bilateral leg pain/neuropathy ?-Started on gabapentin 300 mg p.o. 3 times daily  ?-Continue Dilaudid 1 mg IV every 4 hours as needed for pain ? ?Elevated troponin ?-Troponin elevated 64, 60 ?-Likely demand ischemia ?-No chest pain ? ?History of cocaine abuse/alcohol abuse ?-Reported use 4 days ago ?-Started on CIWA protocol ? ?CAD ?-Restart home regimen including Coreg, Lipitor ? ?History of seizures ?-Continue Keppra ? ?Schizoaffective/bipolar disorder ?-Continue Zyprexa ? ?Hypokalemia ?-Replace  potassium and follow BMP in am ? ? ?Medications ? ?  ? atorvastatin  40 mg Oral Daily  ? enoxaparin (LOVENOX) injection  40 mg Subcutaneous Q24H  ? folic acid  1 mg Oral Daily  ? furosemide  40 mg Intravenous BID  ? gabapentin  300 mg Oral TID  ? guaiFENesin  600 mg Oral BID  ? ipratropium-albuterol  3 mL Nebulization TID  ? isosorbide-hydrALAZINE  1 tablet Oral TID  ? levETIRAcetam  500 mg Oral BID  ? multivitamin with minerals  1 tablet Oral Daily  ? mupirocin cream   Topical Daily  ? OLANZapine  5 mg Oral Daily  ? predniSONE  20 mg Oral Q breakfast  ? spironolactone  12.5 mg Oral Daily  ? thiamine  100 mg Oral Daily  ? Or  ? thiamine  100 mg Intravenous Daily  ? traZODone  25 mg Oral Once  ? ? ? Data Reviewed:  ? ?CBG: ? ?No results for input(s): GLUCAP in the last 168 hours. ? ?SpO2: 99 %  ? ? ?Vitals:  ? 03/04/22 1402 03/04/22 2119 03/05/22 0500 03/05/22 0657  ?BP: (!) 161/111 (!) 153/84  (!) 165/95  ?Pulse: (!) 102 (!) 102  (!) 106  ?Resp: 20 19  18   ?Temp: 98.2 ?F (36.8 ?C) 98.1 ?F (36.7 ?C)  98.7 ?F (37.1 ?C)  ?TempSrc: Oral Oral  Oral  ?SpO2: 98% 99%  99%  ?Weight:   94.1 kg   ?Height:      ? ? ? ? ?Data Reviewed: ? ?Basic Metabolic Panel: ?Recent Labs  ?Lab 03/01/22 ?0353 03/02/22 ?0417 03/03/22 ?05/03/22 03/03/22 ?1650 03/04/22 ?05/04/22 03/05/22 ?05/05/22  ?NA 136 141 138  --  142 143  ?K 5.3* 3.8 3.7  --  3.5 3.3*  ?CL 106 107 108  --  105 102  ?CO2 22 25 20*  --  28 31  ?GLUCOSE 164* 138* 198*  --  164* 166*  ?BUN 31* 34* 30*  --  26* 26*  ?CREATININE 1.16 1.57* 1.19  --  1.27* 1.37*  ?CALCIUM 8.5* 8.6* 8.2*  --  8.4* 8.4*  ?MG  --   --   --  1.7  --   --   ? ? ?CBC: ?Recent Labs  ?Lab 02/27/22 ?0415  ?WBC 7.7  ?HGB 12.8*  ?HCT 40.5  ?MCV 100.0  ?PLT 365  ? ? ?LFT ?Recent Labs  ?Lab 02/27/22 ?0415  ?AST 23  ?ALT 21  ?ALKPHOS 92  ?BILITOT 0.8  ?PROT 6.7  ?ALBUMIN 3.2*  ? ?  ?Antibiotics: ?Anti-infectives (From admission, onward)  ? ? None  ? ?  ? ? ? ?DVT prophylaxis: Lovenox ? ?Code Status: Full code ? ?Family  Communication: No family at bedside ? ? ?CONSULTS  ? ? ?Objective  ? ? ?Physical Examination: ? ?General-appears in no acute distress ?Heart-S1-S2, regular, no murmur auscultated ?Neck-neck veins distended ?Lungs-bilateral wheezing scattered ?Abdomen-soft, nontender, no organomegaly ?Extremities-no edema in the lower extremities ?Neuro-alert, oriented x3, no focal deficit noted ? ?Status is: Inpatient: COPD exacerbation ? ? ?  ? ?Meredeth Ide ?  ?[facility] ?If 7PM-7AM, please contact night-coverage at [email or web address], ?Office  [phone] ? ? ?03/05/2022, 1:46 PM  LOS: 7 days  ? ? ? ? ? ? ? ? ? ? ?  ?

## 2022-03-05 NOTE — Plan of Care (Signed)
?  Problem: Clinical Measurements: ?Goal: Ability to maintain clinical measurements within normal limits will improve ?Outcome: Progressing ?Goal: Will remain free from infection ?Outcome: Progressing ?Goal: Diagnostic test results will improve ?Outcome: Progressing ?  ?

## 2022-03-05 NOTE — Progress Notes (Addendum)
? ?Progress Note ? ?Patient Name: [name] ?Date of Encounter: 03/05/2022 ? ?[facility] Cardiologist: Minus Breeding, MD  ? ?Subjective  ? ?Patient states he does not feel well this morning. He continues to be very short of breath. He is able to lay almost completely flat but he gets short of breath moving around and bed and with talking. He also continues to have significant pain in his feet. Those are his 2 biggest complaints this morning - pain in his feet and his breathing. He does not feel like he has had any significant improvement since being here. He also reports a "little chest pain." He has trouble elaborating on this but sounds like it is really due to his shortness of breath. ? ?Inpatient Medications  ?  ?Scheduled Meds: ? atorvastatin  40 mg Oral Daily  ? enoxaparin (LOVENOX) injection  40 mg Subcutaneous [id number]  ? folic acid  1 mg Oral Daily  ? furosemide  40 mg Intravenous BID  ? gabapentin  300 mg Oral TID  ? guaiFENesin  600 mg Oral BID  ? ipratropium-albuterol  3 mL Nebulization TID  ? isosorbide-hydrALAZINE  1 tablet Oral TID  ? levETIRAcetam  500 mg Oral BID  ? multivitamin with minerals  1 tablet Oral Daily  ? mupirocin cream   Topical Daily  ? OLANZapine  5 mg Oral Daily  ? potassium chloride  40 mEq Oral BID  ? predniSONE  20 mg Oral Q breakfast  ? spironolactone  12.5 mg Oral Daily  ? thiamine  100 mg Oral Daily  ? Or  ? thiamine  100 mg Intravenous Daily  ? traZODone  25 mg Oral Once  ? ?Continuous Infusions: ? ?PRN Meds: ?albuterol, hydrALAZINE, morphine injection  ? ?Vital Signs  ?  ?Vitals:  ? 03/04/22 1402 03/04/22 2119 03/05/22 0500 03/05/22 0657  ?BP: (!) 161/111 (!) 153/84  (!) 165/95  ?Pulse: (!) 102 (!) 102  (!) 106  ?Resp: 20 19  18   ?Temp: 98.2 ?F (36.8 ?C) 98.1 ?F (36.7 ?C)  98.7 ?F (37.1 ?C)  ?TempSrc: Oral Oral  Oral  ?SpO2: 98% 99%  99%  ?Weight:   94.1 kg   ?Height:      ? ? ?Intake/Output Summary (Last 24 hours) at 03/05/2022 0914 ?Last data filed at 03/05/2022  0200 ?Gross per 24 hour  ?Intake 1440 ml  ?Output 7050 ml  ?Net -5610 ml  ? ? ?  03/05/2022  ?  5:00 AM 03/04/2022  ?  5:00 AM 03/03/2022  ?  5:00 AM  ?Last 3 Weights  ?Weight (lbs) 207 lb 7.3 oz 208 lb 8.9 oz 208 lb 1.8 oz  ?Weight (kg) 94.1 kg 94.6 kg 94.4 kg  ?   ? ?Telemetry  ?  ?Not on telemetry. - Personally Reviewed ? ?ECG  ?  ?No new ECG tracing today. - Personally Reviewed ? ?Physical Exam  ? ?GEN: No acute distress.   ?Neck: Distended neck veins. ?Cardiac: Tachycardic with normal rhythm. No murmurs, rubs, or gallops.  ?Respiratory: Increased work of breathing while talking. Decreased breath sounds bilaterally with mild expiratory wheezes noted both anteriorly and posteriorly. No rhonchi or rales.  ?GI: Soft, distended, and non-tender. ?MS: No significant lower extremity edema. Right lower extremity wrapped in gauze. 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 ?Skin: Warm and dry. ?Neuro:  No focal deficits. ?Psych: Normal affect.  ? ?Labs  ?  ?High Sensitivity Troponin:   ?Recent Labs  ?Lab  02/26/22 ?1345 02/26/22 ?1626  ?TROPONINIHS 64* 60*  ?   ?Chemistry ?Recent Labs  ?Lab 02/27/22 ?0415 02/28/22 ?0410 03/03/22 ?[id number] 03/03/22 ?1650 03/04/22 ?[id number] 03/05/22 ?[id number]  ?NA 134*   < > 138  --  142 143  ?K 3.8   < > 3.7  --  3.5 3.3*  ?CL 101   < > 108  --  105 102  ?CO2 23   < > 20*  --  28 31  ?GLUCOSE 92   < > 198*  --  164* 166*  ?BUN 21*   < > 30*  --  26* 26*  ?CREATININE 1.37*   < > 1.19  --  1.27* 1.37*  ?CALCIUM 8.5*   < > 8.2*  --  8.4* 8.4*  ?MG  --   --   --  1.7  --   --   ?PROT 6.7  --   --   --   --   --   ?ALBUMIN 3.2*  --   --   --   --   --   ?AST 23  --   --   --   --   --   ?ALT 21  --   --   --   --   --   ?ALKPHOS 92  --   --   --   --   --   ?BILITOT 0.8  --   --   --   --   --   ?GFRNONAA >60   < > >60  --  >60 >60  ?ANIONGAP 10   < > 10  --  9 10  ? < > = values in this interval not displayed.  ?  ?Lipids  ?Recent Labs  ?Lab 03/04/22 ?0335  ?CHOL 109  ?TRIG 46  ?HDL 55   ?[place] 45  ?CHOLHDL 2.0  ?  ?Hematology ?Recent Labs  ?Lab 02/26/22 ?1345 02/27/22 ?0415  ?WBC 6.4 7.7  ?RBC 4.41 4.05*  ?HGB 14.0 12.8*  ?HCT 41.9 40.5  ?MCV 95.0 100.0  ?MCH 31.7 31.6  ?MCHC 33.4 31.6  ?RDW 14.6 14.6  ?PLT 413* 365  ? ?Thyroid No results for input(s): TSH, FREET4 in the last 168 hours.  ?BNP ?Recent Labs  ?Lab 02/26/22 ?1345 03/03/22 ?[id number]  ?BNP 1,573.5* 1,173.6*  ?  ?DDimer No results for input(s): DDIMER in the last 168 hours.  ? ?Radiology  ?  ?No results found. ? ?Cardiac Studies  ? ?Echocardiogram 01/30/2022: ?Impressions: ?1. LV apical false tendon (normal variant). Left ventricular ejection  ?fraction, by estimation, is <20%. The left ventricle has severely  ?decreased function. The left ventricle demonstrates global hypokinesis.  ?The left ventricular internal cavity size was  ? mildly dilated. There is mild left ventricular hypertrophy. Left  ?ventricular diastolic parameters are consistent with Grade III diastolic  ?dysfunction (restrictive). Elevated left ventricular end-diastolic  ?pressure. The E/e' is 22.  ? 2. Right ventricular systolic function is mildly reduced. The right  ?ventricular size is mildly enlarged. There is moderately elevated  ?pulmonary artery systolic pressure. The estimated right ventricular  ?systolic pressure is [id number] mmHg.  ? 3. Left atrial size was moderately dilated.  ? 4. Right atrial size was moderately dilated.  ? 5. The mitral valve is abnormal. Mild to moderate mitral valve  ?regurgitation.  ? 6. The aortic valve is tricuspid. Aortic valve regurgitation is not  ?visualized. Aortic valve sclerosis is present, with no evidence of aortic  ?valve stenosis.  ?  7. Mildly dilated pulmonary artery.  ? 8. The inferior vena cava is normal in size with <50% respiratory  ?variability, suggesting right atrial pressure of 8 mmHg.  ? ?Comparison(s): Changes from prior study are noted. 12/18/2020: LVEF <20%. ? ?Patient Profile  ?   ?56 y.o. 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 Percival Spanish. ? ?Assessment & Plan  ?  ?Acute on Chronic Combined CHF ?Patient has long history of CHF initially diagnosed in 2018.  However, LVEF dropped to less than 20% in 03/2019.  Prior cath at [facility] in 09/2019 showed moderate nonobstructive CAD felt to be out of proportion to degree of cardiomyopathy.  He did have a coronary CTA in 03/2020 which showed moderate to severe CAD.  Nuclear stress test was unable to perform due to active wheezing on day of presentation he was not felt to be candidate for any further ischemic evaluation due to ongoing medication noncompliance and cocaine use.  He has frequent ED visits and hospitalizations but never follows up in the office.  He has 100% no-show rate in our office.  He was last admitted last month.  Echo at that time showed LVEF of less than 20% with mild LVH, grade 3 diastolic dysfunction, and elevated LVEDP.  RV is mildly enlarged with mildly reduced systolic function and moderately elevated PSAP of 47.7 mmHg.  He was restarted on GDMT but again left AMA without any medications.  He now presents with progressive shortness of breath and lower extremity edema.  BNP elevated at 1573.  Chest x-ray shows cardiomegaly but no overt edema.  He was started on IV Lasix with good urinary response. Documented urinary output of 7L yesterday and net negative 15.7 L this admission. Weight down 5lbs from admission. Renal function relatively stable.  ?- Still volume overloaded on exam. ?- Continue IV Lasix 40mg  twice daily.  ?- Home Coreg stopped due to concern for low output. Can likely restart when he is more compensated from a CHF  standpoint. ?- Continue Bidil 20-37.5mg  three times daily. ?- Will start Spironolactone 12.5mg  daily.  ?- Consider starting Digoxin 0.125mg  daily as well as Losartan or Entresto. Will discuss with MD. ?- Continue to mon

## 2022-03-06 DIAGNOSIS — F1420 Cocaine dependence, uncomplicated: Secondary | ICD-10-CM | POA: Diagnosis not present

## 2022-03-06 DIAGNOSIS — F1010 Alcohol abuse, uncomplicated: Secondary | ICD-10-CM

## 2022-03-06 DIAGNOSIS — J441 Chronic obstructive pulmonary disease with (acute) exacerbation: Secondary | ICD-10-CM | POA: Diagnosis not present

## 2022-03-06 DIAGNOSIS — I5043 Acute on chronic combined systolic (congestive) and diastolic (congestive) heart failure: Secondary | ICD-10-CM | POA: Diagnosis not present

## 2022-03-06 LAB — BASIC METABOLIC PANEL
Anion gap: 11 (ref 5–15)
BUN: 32 mg/dL — ABNORMAL HIGH (ref 6–20)
CO2: 29 mmol/L (ref 22–32)
Calcium: 8.6 mg/dL — ABNORMAL LOW (ref 8.9–10.3)
Chloride: 101 mmol/L (ref 98–111)
Creatinine, Ser: 1.07 mg/dL (ref 0.61–1.24)
GFR, Estimated: >60 mL/min (ref >60)
Glucose, Bld: 202 mg/dL — ABNORMAL HIGH (ref 70–99)
Potassium: 3.6 mmol/L (ref 3.5–5.1)
Sodium: 141 mmol/L (ref 135–145)

## 2022-03-06 MED ORDER — POTASSIUM CHLORIDE CRYS ER 20 MEQ PO TBCR
40.0000 meq | EXTENDED_RELEASE_TABLET | Freq: Once | ORAL | Status: AC
  Administered 2022-03-06: 40 meq via ORAL
  Filled 2022-03-06: qty 2

## 2022-03-06 MED ORDER — SACUBITRIL-VALSARTAN 24-26 MG PO TABS
1.0000 | ORAL_TABLET | Freq: Two times a day (BID) | ORAL | Status: DC
  Administered 2022-03-06 – 2022-03-07 (×3): 1 via ORAL
  Filled 2022-03-06 (×3): qty 1

## 2022-03-06 MED ORDER — MORPHINE SULFATE (PF) 2 MG/ML IV SOLN
1.0000 mg | Freq: Once | INTRAVENOUS | Status: AC
  Administered 2022-03-06: 1 mg via INTRAVENOUS
  Filled 2022-03-06: qty 1

## 2022-03-06 MED ORDER — CARVEDILOL 3.125 MG PO TABS
3.1250 mg | ORAL_TABLET | Freq: Two times a day (BID) | ORAL | Status: DC
  Administered 2022-03-06 – 2022-03-07 (×2): 3.125 mg via ORAL
  Filled 2022-03-06 (×2): qty 1

## 2022-03-06 NOTE — Plan of Care (Signed)
?  Problem: Education: ?Goal: Ability to verbalize understanding of medication therapies will improve ?Outcome: Progressing ?  ?Problem: Cardiac: ?Goal: Ability to achieve and maintain adequate cardiopulmonary perfusion will improve ?Outcome: Progressing ?  ?Problem: Clinical Measurements: ?Goal: Ability to maintain clinical measurements within normal limits will improve ?Outcome: Progressing ?Goal: Will remain free from infection ?Outcome: Progressing ?Goal: Diagnostic test results will improve ?Outcome: Progressing ?Goal: Respiratory complications will improve ?Outcome: Progressing ?Goal: Cardiovascular complication will be avoided ?Outcome: Progressing ?  ?Problem: Activity: ?Goal: Risk for activity intolerance will decrease ?Outcome: Progressing ?  ?Problem: Nutrition: ?Goal: Adequate nutrition will be maintained ?Outcome: Progressing ?  ?Problem: Coping: ?Goal: Level of anxiety will decrease ?Outcome: Progressing ?  ?Problem: Elimination: ?Goal: Will not experience complications related to bowel motility ?Outcome: Progressing ?Goal: Will not experience complications related to urinary retention ?Outcome: Progressing ?  ?Problem: Safety: ?Goal: Ability to remain free from injury will improve ?Outcome: Progressing ?  ?Problem: Skin Integrity: ?Goal: Risk for impaired skin integrity will decrease ?Outcome: Progressing ?  ?

## 2022-03-06 NOTE — Progress Notes (Signed)
30 Day [place] Note ? ?RE:  [id number]       ?Date of Birth: __ 31-Jul-1966  ?Date:  03/06/2022     ? ?To Whom It May Concern: ? ?Please be advised that the above-named patient will require a short-term nursing home stay - anticipated 30 days or less for rehabilitation and strengthening.  The plan is for return home. ? ?Geni Bers, RN, CM ?[phone] ?  ?

## 2022-03-06 NOTE — Progress Notes (Addendum)
[facility] ? ?PROGRESS NOTE ? ?[name] [id number] DOB: Nov 02, 1966 DOA: 02/26/2022 ?PCP: Pcp, No ? ? ?Brief HPI:   ? ?56 year old male with medical history of polyneuropathy, chronic combined CHF, CAD, cocaine abuse, hyperlipidemia, hypertension, bipolar disorder, COPD presented with shortness of breath.  Patient also noticed increase swelling in his legs.  He was started on IV Lasix for possible CHF exacerbation. ? ? ?Subjective  ? ?Patient seen and examined, breathing is significantly improved.  Diuresed well with Lasix.  Net -19 L. ? ? ? ?Assessment/plan ? ?COPD exacerbation ?-Patient was started on DuoNeb nebulizer every 8 hours ?-Started on Solu-Medrol 40 mg IV every 12 hours; switched to p.o. prednisone 40 mg daily ?-Prednisone has been changed to 20 mg p.o. daily ?-Mucinex 600 mg p.o. twice daily ? ?Acute on chronic combined systolic and diastolic CHF ?-BNP was elevated 1573.5 ?-He was started on IV Lasix; Lasix was held due to elevated creatinine ?-Patient started on  Lasix 40 mg daily ?-BNP was elevated at 1173, cardiology was consulted ?-P.o. Lasix was changed to IV Lasix 40 mg every 12 hours ?-Had excellent diuretic response with IV Lasix ?-Continue BiDil, Aldactone ?-Net - 19 L ? ? ?Acute kidney injury ?-Resolved, creatinine fluctuating ?-Today creatinine is 1.07 ? ?Chronic bilateral lower extremity wounds ?-No evidence of draining wounds ?-Wound care consulted for dressing recommendations ? ?Bilateral leg pain/neuropathy ?-Started on gabapentin 300 mg p.o. 3 times daily  ?-Continue Dilaudid 1 mg IV every 4 hours as needed for pain ?-Dose of gabapentin increased to 400 mg p.o. 3 times daily ? ?Elevated troponin ?-Troponin elevated 64, 60 ?-Likely demand ischemia ?-No chest pain ? ?History of cocaine abuse/alcohol abuse ?-Reported use 4 days ago ?-Started on CIWA protocol ? ?CAD ?-Restart home regimen including Coreg, Lipitor ? ?History of seizures ?-Continue  Keppra ? ?Schizoaffective/bipolar disorder ?-Continue Zyprexa ? ?Hypokalemia ?-Replete  ? ? ?Medications ? ?  ? atorvastatin  40 mg Oral Daily  ? carvedilol  3.125 mg Oral BID WC  ? enoxaparin (LOVENOX) injection  40 mg Subcutaneous Q24H  ? folic acid  1 mg Oral Daily  ? furosemide  40 mg Intravenous [id number]  ? gabapentin  400 mg Oral TID  ? guaiFENesin  600 mg Oral BID  ? ipratropium-albuterol  3 mL Nebulization TID  ? isosorbide-hydrALAZINE  1 tablet Oral TID  ? levETIRAcetam  500 mg Oral BID  ? multivitamin with minerals  1 tablet Oral Daily  ? mupirocin cream   Topical Daily  ? OLANZapine  5 mg Oral Daily  ? predniSONE  20 mg Oral Q breakfast  ? sacubitril-valsartan  1 tablet Oral BID  ? spironolactone  12.5 mg Oral Daily  ? thiamine  100 mg Oral Daily  ? Or  ? thiamine  100 mg Intravenous Daily  ? traZODone  25 mg Oral Once  ? ? ? Data Reviewed:  ? ?CBG: ? ?No results for input(s): GLUCAP in the last 168 hours. ? ?SpO2: 98 %  ? ? ?Vitals:  ? 03/05/22 2150 03/05/22 2210 03/06/22 0929 03/06/22 1525  ?BP:  (!) 131/104  (!) 147/92  ?Pulse: 77 97  (!) 103  ?Resp: 18 20  16   ?Temp:  99 ?F (37.2 ?C)  98.3 ?F (36.8 ?C)  ?TempSrc:  Oral  Oral  ?SpO2: 95% 97% 98% 98%  ?Weight:      ?Height:      ? ? ? ? ?Data Reviewed: ? ?Basic Metabolic Panel: ?Recent Labs  ?Lab 03/02/22 ?  [id number] 03/03/22 ?2229 03/03/22 ?1650 03/04/22 ?7989 03/05/22 ?2119 03/06/22 ?0326  ?NA 141 138  --  142 143 141  ?K 3.8 3.7  --  3.5 3.3* 3.6  ?CL 107 108  --  105 102 101  ?CO2 25 20*  --  28 31 29   ?GLUCOSE 138* 198*  --  164* 166* 202*  ?BUN 34* 30*  --  26* 26* 32*  ?CREATININE 1.57* 1.19  --  1.27* 1.37* 1.07  ?CALCIUM 8.6* 8.2*  --  8.4* 8.4* 8.6*  ?MG  --   --  1.7  --   --   --   ? ? ?CBC: ?No results for input(s): WBC, NEUTROABS, HGB, HCT, MCV, PLT in the last 168 hours. ? ? ?LFT ?No results for input(s): AST, ALT, ALKPHOS, BILITOT, PROT, ALBUMIN in the last 168 hours. ? ?  ?Antibiotics: ?Anti-infectives (From admission, onward)  ? ? None  ? ?   ? ? ? ?DVT prophylaxis: Lovenox ? ?Code Status: Full code ? ?Family Communication: No family at bedside ? ? ?CONSULTS  ? ? ?Objective  ? ? ?Physical Examination: ? ?General-appears in no acute distress ?Heart-S1-S2, regular, no murmur auscultated ?Lungs-clear to auscultation bilaterally, no wheezing or crackles auscultated ?Abdomen-soft, nontender, no organomegaly ?Extremities-no edema in the lower extremities, leg ulcers noted in the right lower extremity ?Neuro-alert, oriented x3, no focal deficit noted ? ?Status is: Inpatient: COPD exacerbation ? ? ?  ? ? ?  ?[facility] ?If 7PM-7AM, please contact night-coverage at [email or web address], ?Office  [phone] ? ? ?03/06/2022, 3:30 PM  LOS: 8 days  ? ? ? ? ? ? ? ? ? ? ?  ?

## 2022-03-06 NOTE — Plan of Care (Signed)
?  Problem: Activity: ?Goal: Capacity to carry out activities will improve ?Outcome: Progressing ?  ?Problem: Clinical Measurements: ?Goal: Ability to maintain clinical measurements within normal limits will improve ?Outcome: Progressing ?Goal: Cardiovascular complication will be avoided ?Outcome: Progressing ?  ?Problem: Activity: ?Goal: Risk for activity intolerance will decrease ?Outcome: Progressing ?  ?

## 2022-03-06 NOTE — NC FL2 (Signed)
?[place] MEDICAID FL2 LEVEL OF CARE SCREENING TOOL  ?  ? ?IDENTIFICATION  ?Patient Name: ?[name] Birthdate: [birth year] Sex: male Admission Date (Current Location): ?02/26/2022  ?[place] and [place] Number: ? [place] ?  Facility and Address:  ?[facility],  [address] [place], [place] ?     Provider Number: ?[id number]  ?Attending Physician Name and Address:  ?[name], MD ? Relative Name and Phone Number:  ?  ?   ?Current Level of Care: ?Hospital Recommended Level of Care: ?[place] Prior Approval Number: ?  ? ?Date Approved/Denied: ?  PASRR Number: ?  ? ?Discharge Plan: ?SNF ?  ? ?Current Diagnoses: ?Patient Active Problem List  ? Diagnosis Date Noted  ? Acute on chronic systolic heart failure ([place]) 02/26/2022  ? Seizure ([place]) 02/26/2022  ? Venous stasis ulcer of right ankle limited to breakdown of skin with varicose veins (HCC)   ? Hypertensive urgency 01/29/2022  ? Prolonged QT interval 01/29/2022  ? Alcohol abuse 01/29/2022  ? chronic ulcers 01/29/2022  ? Wound infection 12/04/2021  ? Open wounds involving multiple regions of lower extremity 12/03/2021  ? Mixed diabetic hyperlipidemia associated with type 2 diabetes mellitus ([place]) 12/03/2021  ? Cellulitis of right lower extremity 11/13/2021  ? Type 2 diabetes mellitus with diabetic polyneuropathy, without long-term current use of insulin ([place]) 11/13/2021  ? Antisocial personality disorder ([place]) 11/04/2021  ? Malingering 11/04/2021  ? Foot pain 04/23/2021  ? Chronic combined systolic and diastolic congestive heart failure ([place]) 03/02/2021  ? History of stroke 03/02/2021  ? Substance use disorder 03/02/2021  ? Skin ulcer of right great toe with necrosis of bone ([place])   ? Encounter for assessment of healthcare decision-making capacity   ? Pain, dental   ? Dystonia   ? Dysarthria   ? Shock liver 01/04/2021  ? Acute metabolic encephalopathy [id number]  ? Coronary artery disease involving native coronary artery of  native heart without angina pectoris 01/04/2021  ? Bipolar I disorder ([place]) 01/04/2021  ? Cocaine abuse ([place]) 01/04/2021  ? Homelessness 01/04/2021  ? HLD (hyperlipidemia) 01/04/2021  ? Osteomyelitis of great toe ([place])   ? Ulcer of great toe, left, with necrosis of bone ([place])   ? Ulcer of great toe, right, with necrosis of muscle ([place])   ? Acute osteomyelitis of toe, left ([place])   ? Acute on chronic combined systolic and diastolic CHF (congestive heart failure) ([place]) 12/20/2020  ? Respiratory failure ([place])   ? Ischemic stroke ([place]) 12/17/2020  ? 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 Diabetic foot ulcer ([place]) 12/08/2020  ? CHF (congestive heart failure) ([place]) 11/14/2020  ? CHF exacerbation ([place]) 11/13/2020  ? Hypokalemia   ? Hyperlipidemia   ? Acute respiratory failure with hypoxia ([place]) 10/26/2020  ? COPD (chronic obstructive pulmonary disease) ([place]) 10/14/2020  ? Acute on chronic combined systolic and diastolic CHF (congestive heart failure) ([place]) 10/02/2020  ? Near syncope 07/21/2020  ? Schizoaffective disorder ([place]) 06/24/2019  ? SOB (shortness of breath)   ? QT prolongation 05/25/2019  ? Cocaine abuse ([place])   ? Elevated troponin   ? Homeless   ? Suicidal ideation   ? Bilateral foot pain 04/05/2019  ? Housing situation unstable 03/28/2019  ? Bilateral lower extremity edema   ? Asthma 12/26/2016  ? Tobacco use disorder 12/26/2016  ? Schizophrenia ([place]) 12/26/2016  ? Cocaine abuse with cocaine-induced mood disorder ([place]) 09/06/2016  ? Alcohol use disorder, mild, abuse 07/31/2016  ? Substance induced mood disorder (  [place]) 07/30/2016  ? Schizoaffective disorder, bipolar type ([place]) 05/31/2016  ? Cocaine use disorder, severe, dependence ([place]) 05/31/2016  ? ? ?Orientation RESPIRATION BLADDER Height & Weight   ?  ?Self, Time, Situation, Place ? Normal Continent Weight: 94.1 kg ?Height:  5\' 8"  (172.7 cm)  ?BEHAVIORAL SYMPTOMS/MOOD NEUROLOGICAL BOWEL NUTRITION STATUS  ?    Continent Diet  ?AMBULATORY STATUS COMMUNICATION OF  NEEDS Skin   ?Extensive Assist Verbally Other (Comment) (Amputated right toes, amputated left toes, right open Wound Left Lateral Lower extremity) ?  ?  ?  ?    ?     ?     ? ? ?Personal Care Assistance Level of Assistance  ?Bathing, Feeding, Dressing Bathing Assistance: Limited assistance ?Feeding assistance: Independent ?Dressing Assistance: Limited assistance ?   ? ?Functional Limitations Info  ?Sight, Hearing, Speech Sight Info: Adequate ?Hearing Info: Adequate ?Speech Info: Impaired (Slurred/Dysarthria)  ? ? ?SPECIAL CARE FACTORS FREQUENCY  ?PT (By licensed PT), OT (By licensed OT)   ?  ?PT Frequency: Eval and Treat ?OT Frequency: Eval and Treat ?  ?  ?  ?   ? ? ?Contractures Contractures Info: Not present  ? ? ?Additional Factors Info  ?Code Status, Allergies Code Status Info: FULL ?Allergies Info: Ibuprofen, Acetaminophen, Lyrica Cr (Pregabalin Er), Peanut-containing Drug Products ?  ?  ?  ?   ? ?Current Medications (03/06/2022):  This is the current hospital active medication list ?Current Facility-Administered Medications  ?Medication Dose Route Frequency Provider Last Rate Last Admin  ? albuterol (PROVENTIL) (2.5 MG/3ML) 0.083% nebulizer solution 2.5 mg  2.5 mg Nebulization Q4H PRN [name], MD   2.5 mg at 03/03/22 0225  ? atorvastatin (LIPITOR) tablet 40 mg  40 mg Oral Daily [name], MD   40 mg at 03/06/22 1008  ? carvedilol (COREG) tablet 3.125 mg  3.125 mg Oral BID WC Goodrich, Callie E, PA-C      ? enoxaparin (LOVENOX) injection 40 mg  40 mg Subcutaneous Q24H Arlyn Dunning M, RPH   40 mg at 03/03/22 2002  ? folic acid (FOLVITE) tablet 1 mg  1 mg Oral Daily Kyle, Tyrone A, DO   1 mg at 03/06/22 1007  ? furosemide (LASIX) injection 40 mg  40 mg Intravenous Ivery Quale, MD   40 mg at 03/06/22 0809  ? gabapentin (NEURONTIN) capsule 400 mg  400 mg Oral TID [name], MD   400 mg at 03/06/22 1008  ? guaiFENesin (MUCINEX) 12 hr tablet 600 mg  600 mg Oral BID [name], MD   600  mg at 03/06/22 1007  ? hydrALAZINE (APRESOLINE) injection 10 mg  10 mg Intravenous Q8H PRN Marylyn Ishihara, Tyrone A, DO      ? ipratropium-albuterol (DUONEB) 0.5-2.5 (3) MG/3ML nebulizer solution 3 mL  3 mL Nebulization TID [name], MD   3 mL at 03/06/22 1432  ? isosorbide-hydrALAZINE (BIDIL) 20-37.5 MG per tablet 1 tablet  1 tablet Oral TID Sande Rives E, PA-C   1 tablet at 03/06/22 1008  ? levETIRAcetam (KEPPRA) tablet 500 mg  500 mg Oral BID Marylyn Ishihara, Tyrone A, DO   500 mg at 03/06/22 1007  ? morphine (PF) 2 MG/ML injection 2 mg  2 mg Intravenous Q4H PRN [name], MD   2 mg at 03/06/22 1301  ? multivitamin with minerals tablet 1 tablet  1 tablet Oral Daily Kyle, Tyrone A, DO   1 tablet at 03/06/22 1008  ? mupirocin  cream (BACTROBAN) 2 %   Topical Daily [name], MD   Given at 03/06/22 1044  ? OLANZapine (ZYPREXA) tablet 5 mg  5 mg Oral Daily [name], MD   5 mg at 03/06/22 1007  ? potassium chloride SA (KLOR-CON M) CR tablet 40 mEq  40 mEq Oral Once Sande Rives E, PA-C      ? predniSONE (DELTASONE) tablet 20 mg  20 mg Oral Q breakfast [name], MD   20 mg at 03/06/22 0809  ? sacubitril-valsartan (ENTRESTO) 24-26 mg per tablet  1 tablet Oral BID Sande Rives E, PA-C      ? spironolactone (ALDACTONE) tablet 12.5 mg  12.5 mg Oral Daily Sande Rives E, PA-C   12.5 mg at 03/06/22 1008  ? thiamine tablet 100 mg  100 mg Oral Daily Kyle, Tyrone A, DO   100 mg at 03/06/22 1008  ? Or  ? thiamine (B-1) injection 100 mg  100 mg Intravenous Daily Kyle, Tyrone A, DO   100 mg at 03/03/22 1036  ? traZODone (DESYREL) tablet 25 mg  25 mg Oral Once [name], NP      ? ? ? ?Discharge Medications: ?Please see discharge summary for a list of discharge medications. ? ?Relevant Imaging Results: ? ?Relevant Lab Results: ? ? ?Additional Information ?[phone] ? ?Taim Wurm, RN ? ? ? ? ?

## 2022-03-06 NOTE — Progress Notes (Addendum)
? ?Progress Note ? ?Patient Name: [name] ?Date of Encounter: 03/06/2022 ? ?[facility] Cardiologist: Minus Breeding, MD  ? ?Subjective  ? ?Patient is more frustrated today. He has been refusing dressing changes most the time and refusing vital signs. His biggest complaint today is ongoing foot pain. He got frustrated when I asked him if he was letting the RN staff take care of his wound and change his dresses and when I tried to explain that treatment of his leg wound is going to be a long process. He reports light improvement in his breathing and does not seem as out of breath today. He continues to have a "little chest pain." He feels like we have not done anything since he has been here. He is determined to leave tomorrow. ? ?Inpatient Medications  ?  ?Scheduled Meds: ? atorvastatin  40 mg Oral Daily  ? carvedilol  3.125 mg Oral BID WC  ? enoxaparin (LOVENOX) injection  40 mg Subcutaneous [id number]  ? folic acid  1 mg Oral Daily  ? furosemide  40 mg Intravenous [id number]  ? gabapentin  400 mg Oral TID  ? guaiFENesin  600 mg Oral BID  ? ipratropium-albuterol  3 mL Nebulization TID  ? isosorbide-hydrALAZINE  1 tablet Oral TID  ? levETIRAcetam  500 mg Oral BID  ? multivitamin with minerals  1 tablet Oral Daily  ? mupirocin cream   Topical Daily  ? OLANZapine  5 mg Oral Daily  ? potassium chloride  40 mEq Oral Once  ? predniSONE  20 mg Oral Q breakfast  ? sacubitril-valsartan  1 tablet Oral BID  ? spironolactone  12.5 mg Oral Daily  ? thiamine  100 mg Oral Daily  ? Or  ? thiamine  100 mg Intravenous Daily  ? traZODone  25 mg Oral Once  ? ?Continuous Infusions: ? ?PRN Meds: ?albuterol, hydrALAZINE, morphine injection  ? ?Vital Signs  ?  ?Vitals:  ? 03/05/22 0657 03/05/22 2150 03/05/22 2210 03/06/22 0929  ?BP: (!) 165/95  (!) 131/104   ?Pulse: (!) 106 77 97   ?Resp: 18 18 20    ?Temp: 98.7 ?F (37.1 ?C)  99 ?F (37.2 ?C)   ?TempSrc: Oral  Oral   ?SpO2: 99% 95% 97% 98%  ?Weight:      ?Height:      ? ? ?Intake/Output  Summary (Last 24 hours) at 03/06/2022 1221 ?Last data filed at 03/06/2022 1146 ?Gross per 24 hour  ?Intake 240 ml  ?Output 3075 ml  ?Net -2835 ml  ? ? ?  03/05/2022  ?  5:00 AM 03/04/2022  ?  5:00 AM 03/03/2022  ?  5:00 AM  ?Last 3 Weights  ?Weight (lbs) 207 lb 7.3 oz 208 lb 8.9 oz 208 lb 1.8 oz  ?Weight (kg) 94.1 kg 94.6 kg 94.4 kg  ?   ? ?Telemetry  ?  ?Not on telemetry. - Personally Reviewed ? ?ECG  ?  ?No new ECG tracing today. - Personally Reviewed ? ?Physical Exam  ? ?GEN: No acute distress.   ?Neck: No significant JVD. ?Cardiac: Mildly tachycardic with normal rhythm. No murmurs, rubs, or gallops.  ?Respiratory: No significant increased work of breathing. Decreased breath sounds bilaterally but no wheezes, rhonchi, or rales appreciated. ?GI: Soft but mildly distended. Non-tender. ?MS: No lower extremity edema. No deformity. ?Skin: Warm and dry. ?Neuro:  No focal deficits. ?Psych: Normal affect.  ? ?Labs  ?  ?High Sensitivity Troponin:   ?Recent Labs  ?Lab 02/26/22 ?1345 02/26/22 ?  1626  ?TROPONINIHS 64* 60*  ?   ?Chemistry ?Recent Labs  ?Lab 03/03/22 ?1650 03/04/22 ?[id number] 03/05/22 ?[id number] 03/06/22 ?0326  ?NA  --  142 143 141  ?K  --  3.5 3.3* 3.6  ?CL  --  105 102 101  ?CO2  --  28 31 29   ?GLUCOSE  --  164* 166* 202*  ?BUN  --  26* 26* 32*  ?CREATININE  --  1.27* 1.37* 1.07  ?CALCIUM  --  8.4* 8.4* 8.6*  ?MG 1.7  --   --   --   ?GFRNONAA  --  >60 >60 >60  ?ANIONGAP  --  9 10 11   ?  ?Lipids  ?Recent Labs  ?Lab 03/04/22 ?0335  ?CHOL 109  ?TRIG 46  ?HDL 55  ?Blair 45  ?CHOLHDL 2.0  ?  ?HematologyNo results for input(s): WBC, RBC, HGB, HCT, MCV, MCH, MCHC, RDW, PLT in the last 168 hours. ?Thyroid No results for input(s): TSH, FREET4 in the last 168 hours.  ?BNP ?Recent Labs  ?Lab 03/03/22 ?[id number]  ?BNP 1,173.6*  ?  ?DDimer No results for input(s): DDIMER in the last 168 hours.  ? ?Radiology  ?  ?No results found. ? ?Cardiac Studies  ? ?Echocardiogram 01/30/2022: ?Impressions: ?1. LV apical false tendon (normal variant). Left  ventricular ejection  ?fraction, by estimation, is <20%. The left ventricle has severely  ?decreased function. The left ventricle demonstrates global hypokinesis.  ?The left ventricular internal cavity size was  ? mildly dilated. There is mild left ventricular hypertrophy. Left  ?ventricular diastolic parameters are consistent with Grade III diastolic  ?dysfunction (restrictive). Elevated left ventricular end-diastolic  ?pressure. The E/e' is 22.  ? 2. Right ventricular systolic function is mildly reduced. The right  ?ventricular size is mildly enlarged. There is moderately elevated  ?pulmonary artery systolic pressure. The estimated right ventricular  ?systolic pressure is [id number] mmHg.  ? 3. Left atrial size was moderately dilated.  ? 4. Right atrial size was moderately dilated.  ? 5. The mitral valve is abnormal. Mild to moderate mitral valve  ?regurgitation.  ? 6. The aortic valve is tricuspid. Aortic valve regurgitation is not  ?visualized. Aortic valve sclerosis is present, with no evidence of aortic  ?valve stenosis.  ? 7. Mildly dilated pulmonary artery.  ? 8. The inferior vena cava is normal in size with <50% respiratory  ?variability, suggesting right atrial pressure of 8 mmHg.  ? ?Comparison(s): Changes from prior study are noted. 12/18/2020: LVEF <20%. ? ?Patient Profile  ?   ?55 y.o. 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 Percival Spanish. ? ?Assessment & Plan  ?  ?Acute on Chronic Combined CHF ?Patient has long history of CHF initially diagnosed in 2018.  However, LVEF dropped to less than 20% in 03/2019.  Prior cath at [facility] in 09/2019 showed moderate nonobstructive CAD felt to  be out of proportion to degree of cardiomyopathy.  He did have a coronary CTA in 03/2020 which showed moderate to severe CAD.  Nuclear stress test was unable to perform due to active wheezing on day of presentation he was not felt to be candidate for any further ischemic evaluation due to ongoing medication noncompliance and  cocaine use.  He has frequent ED visits and hospitalizations but never follows up in the office.  He has 100% no-show rate in our office.  He was last admitted last month.  Echo at that time showed LVEF of less than 20% with mild LVH, grade 3 diastolic dysfunction, and elevated LVEDP.  RV is mildly enlarged with mildly reduced systolic function and moderately elevated PSAP of 47.7 mmHg.  He was restarted on GDMT but again left AMA without any medications.  He now presents with progressive shortness of breath and lower extremity edema.  BNP elevated at 1573.  Chest x-ray shows cardiomegaly but no overt edema.  He was started on IV Lasix with good urinary response. Documented urinary output of 4.5L yesterday and net negative 19.8 L this admission. No updated weight today. Renal function stable.  ?- He does not seem as out of breath today but abdomen is still distended. ?- Continue IV Lasix 40mg  twice daily for now.  ?- Home Coreg was stopped due to concern for low output. Will restart low dose Coreg 3.125mg  twice daily given he has diuresed significantly. ?- Continue Bidil 20-37.5mg  three times daily. ?- Continue Spironolactone 12.5mg  daily (started yesterday). ?- Will start Entresto 24-26mg  twice daily.  ?- Continue to monitor daily weights, strict I/Os, and renal function. ?- Social work is following and is trying to help find SNF but it sounds like no SNF has agreed to take him so far. Medication compliance is going to continue to be a large issue. His psych and social situation are the largest determinant of his outcome and he remains at high risk for progressive CHF or death. ?  ?CAD ?Prior  cath at [facility] in 2020 showed moderate non-obstructive CAD. Coronary CTA in 03/2020 showed moderate to severe disease of RCA, LAD, and LCX. However, not felt to be candidate for invasive ischemic work-up due to

## 2022-03-06 NOTE — Progress Notes (Signed)
OT Cancellation Note ? ?Patient Details ?Name: [name] ?MRN: [id number] ?DOB: 04/27/1966 ? ? ?Cancelled Treatment:    Reason Eval/Treat Not Completed: Other (comment). Patient has been declining services today and more agitated per nursing. Will f/u as able.  ? ?Kelli Churn ?03/06/2022, 1:30 PM ?

## 2022-03-06 NOTE — Progress Notes (Signed)
End of shift ? ?Pt refused assessment, refused to answer any questions other than name and dob, and refused a shower.  Pt was aggressive verbally to staff today. ? ?Pt wants prn medication q4.  He is not pleased with potential discharge tomorrow. ?Pt did allow RN to replace dressings on legs after Dr Sharl Ma removed the dressings. Pt consistently says that pain is a 10 out of 10.  ?

## 2022-03-06 NOTE — TOC Progression Note (Signed)
Transition of Care (TOC) - Progression Note  ? ? ?Patient Details  ?Name: [name] ?MRN: [id number] ?Date of Birth: [birth year] ? ?Transition of Care (TOC) CM/SW Contact  ?Geni Bers, RN ?Phone Number: ?03/06/2022, 4:28 PM ? ?Clinical Narrative:    ?Pt was faxed to SNF. Pt has [place] Medicaid. [facility] at 12:30 PM. Pt is aware that SNF may not offer him a bed. Only options are a hotel-pt pays, or shelter. ? ? ?Expected Discharge Plan: Homeless Shelter ?Barriers to Discharge: No Barriers Identified ? ?Expected Discharge Plan and Services ?Expected Discharge Plan: Homeless Shelter ?  ?  ?  ?Living arrangements for the past 2 months: Homeless ?                ?  ?  ?  ?  ?  ?  ?  ?  ?  ?  ? ? ?Social Determinants of Health (SDOH) Interventions ?  ? ?Readmission Risk Interventions ? ?  02/04/2022  ?  4:41 PM 12/05/2021  ? 11:48 AM 03/05/2021  ?  2:42 PM  ?Readmission Risk Prevention Plan  ?Transportation Screening Complete Complete Complete  ?Medication Review Oceanographer) Complete Not Complete Complete  ?PCP or Specialist appointment within 3-5 days of discharge Complete Not Complete Complete  ?HRI or Home Care Consult Complete Not Complete Complete  ?SW Recovery Care/Counseling Consult Complete Not Complete Complete  ?Palliative Care Screening Not Applicable Not Applicable Not Applicable  ?Skilled Nursing Facility Complete Not Applicable Complete  ? ? ?

## 2022-03-06 NOTE — Progress Notes (Signed)
Pt refused to let nurse tech to take his vital signs and refused to let writer perform dressing change. Educated patient without success. ?

## 2022-03-07 ENCOUNTER — Other Ambulatory Visit (HOSPITAL_COMMUNITY): Payer: Self-pay

## 2022-03-07 LAB — BASIC METABOLIC PANEL
Anion gap: 11 (ref 5–15)
BUN: 30 mg/dL — ABNORMAL HIGH (ref 6–20)
CO2: 25 mmol/L (ref 22–32)
Calcium: 8.8 mg/dL — ABNORMAL LOW (ref 8.9–10.3)
Chloride: 102 mmol/L (ref 98–111)
Creatinine, Ser: 1.04 mg/dL (ref 0.61–1.24)
GFR, Estimated: >60 mL/min (ref >60)
Glucose, Bld: 278 mg/dL — ABNORMAL HIGH (ref 70–99)
Potassium: 3.9 mmol/L (ref 3.5–5.1)
Sodium: 138 mmol/L (ref 135–145)

## 2022-03-07 MED ORDER — UMECLIDINIUM BROMIDE 62.5 MCG/ACT IN AEPB
1.0000 | INHALATION_SPRAY | Freq: Every day | RESPIRATORY_TRACT | 2 refills | Status: DC
  Filled 2022-03-07 (×2): qty 30, 30d supply, fill #0

## 2022-03-07 MED ORDER — OXYCODONE HCL 5 MG PO TABS
5.0000 mg | ORAL_TABLET | Freq: Four times a day (QID) | ORAL | 0 refills | Status: DC | PRN
  Filled 2022-03-07: qty 20, 3d supply, fill #0

## 2022-03-07 MED ORDER — VALSARTAN 40 MG PO TABS
20.0000 mg | ORAL_TABLET | Freq: Two times a day (BID) | ORAL | 11 refills | Status: DC
  Filled 2022-03-07 (×2): qty 30, 30d supply, fill #0

## 2022-03-07 MED ORDER — ISOSORBIDE MONONITRATE ER 60 MG PO TB24
60.0000 mg | ORAL_TABLET | Freq: Every day | ORAL | 3 refills | Status: DC
Start: 2022-03-07 — End: 2022-04-11
  Filled 2022-03-07: qty 30, 30d supply, fill #0

## 2022-03-07 MED ORDER — CARVEDILOL 3.125 MG PO TABS
3.1250 mg | ORAL_TABLET | Freq: Two times a day (BID) | ORAL | 3 refills | Status: DC
  Filled 2022-03-07 (×2): qty 60, 30d supply, fill #0

## 2022-03-07 MED ORDER — HYDRALAZINE HCL 25 MG PO TABS
25.0000 mg | ORAL_TABLET | Freq: Three times a day (TID) | ORAL | 3 refills | Status: DC
  Filled 2022-03-07: qty 90, 30d supply, fill #0

## 2022-03-07 MED ORDER — SPIRIVA HANDIHALER 18 MCG IN CAPS
18.0000 ug | ORAL_CAPSULE | Freq: Every day | RESPIRATORY_TRACT | 2 refills | Status: DC
  Filled 2022-03-07: qty 30, 30d supply, fill #0

## 2022-03-07 MED ORDER — THIAMINE HCL 100 MG PO TABS
100.0000 mg | ORAL_TABLET | Freq: Every day | ORAL | 2 refills | Status: DC
  Filled 2022-03-07: qty 30, 30d supply, fill #0

## 2022-03-07 MED ORDER — LEVETIRACETAM 500 MG PO TABS
500.0000 mg | ORAL_TABLET | Freq: Two times a day (BID) | ORAL | 0 refills | Status: DC
Start: 2022-03-07 — End: 2022-04-11
  Filled 2022-03-07 (×2): qty 60, 30d supply, fill #0

## 2022-03-07 MED ORDER — FUROSEMIDE 40 MG PO TABS
40.0000 mg | ORAL_TABLET | Freq: Two times a day (BID) | ORAL | 11 refills | Status: DC
Start: 2022-03-07 — End: 2022-04-11
  Filled 2022-03-07: qty 60, 30d supply, fill #0

## 2022-03-07 MED ORDER — OLANZAPINE 5 MG PO TABS
5.0000 mg | ORAL_TABLET | Freq: Every day | ORAL | 2 refills | Status: DC
Start: 2022-03-07 — End: 2022-04-11
  Filled 2022-03-07: qty 30, 30d supply, fill #0

## 2022-03-07 MED ORDER — FOLIC ACID 1 MG PO TABS
1.0000 mg | ORAL_TABLET | Freq: Every day | ORAL | 3 refills | Status: DC
  Filled 2022-03-07 (×2): qty 30, 30d supply, fill #0

## 2022-03-07 MED ORDER — ATORVASTATIN CALCIUM 10 MG PO TABS
40.0000 mg | ORAL_TABLET | Freq: Every day | ORAL | 0 refills | Status: DC
  Filled 2022-03-07 (×2): qty 120, 30d supply, fill #0

## 2022-03-07 MED ORDER — GABAPENTIN 400 MG PO CAPS
400.0000 mg | ORAL_CAPSULE | Freq: Three times a day (TID) | ORAL | 3 refills | Status: DC
  Filled 2022-03-07 (×2): qty 120, 40d supply, fill #0

## 2022-03-07 MED ORDER — SACUBITRIL-VALSARTAN 24-26 MG PO TABS
1.0000 | ORAL_TABLET | Freq: Two times a day (BID) | ORAL | 2 refills | Status: DC
  Filled 2022-03-07 (×2): qty 60, 30d supply, fill #0

## 2022-03-07 MED ORDER — ATORVASTATIN CALCIUM 40 MG PO TABS
40.0000 mg | ORAL_TABLET | Freq: Every day | ORAL | 0 refills | Status: DC
  Filled 2022-03-07: qty 30, 30d supply, fill #0

## 2022-03-07 MED ORDER — SPIRONOLACTONE 25 MG PO TABS
12.5000 mg | ORAL_TABLET | Freq: Every day | ORAL | 3 refills | Status: DC
  Filled 2022-03-07 (×2): qty 30, 60d supply, fill #0

## 2022-03-07 MED ORDER — ISOSORB DINITRATE-HYDRALAZINE 20-37.5 MG PO TABS
1.0000 | ORAL_TABLET | Freq: Three times a day (TID) | ORAL | 3 refills | Status: DC
  Filled 2022-03-07 (×2): qty 90, 30d supply, fill #0

## 2022-03-07 MED ORDER — OXYCODONE HCL 5 MG PO TABS
5.0000 mg | ORAL_TABLET | Freq: Four times a day (QID) | ORAL | 0 refills | Status: DC | PRN
Start: 2022-03-07 — End: 2022-03-29
  Filled 2022-03-07: qty 20, 3d supply, fill #0

## 2022-03-07 MED ORDER — OLANZAPINE 5 MG PO TABS: 5.0000 mg | ORAL_TABLET | Freq: Every day | ORAL | 2 refills | Status: DC

## 2022-03-07 MED ORDER — ALBUTEROL SULFATE HFA 108 (90 BASE) MCG/ACT IN AERS
2.0000 | INHALATION_SPRAY | Freq: Four times a day (QID) | RESPIRATORY_TRACT | 2 refills | Status: DC | PRN
  Filled 2022-03-07: qty 18, 25d supply, fill #0
  Filled 2022-03-07: qty 8, fill #0

## 2022-03-07 NOTE — TOC Benefit Eligibility Note (Signed)
Patient Advocate Encounter ?  ?Insurance verification completed.   ?  ?The patient is currently admitted and upon discharge could be taking ENRESTO. ?  ?The current 30 day co-pay is, $4.  ? ?The patient is insured through [facility] MEDICAID. ? ? ?PA WAS NEEDED  ?IT HAS BEEN APPROVED ?FROM 4.7.23 TO 4.6.24 ? ?

## 2022-03-07 NOTE — Progress Notes (Signed)
OT Cancellation Note ? ?Patient Details ?Name: [name] ?MRN: [id number] ?DOB: 02/18/1966 ? ? ?Cancelled Treatment:    Reason Eval/Treat Not Completed: Other (comment). RN says not to attempt due to patient refusing all medical assistance, continued agitation and being discharged. ? ?Kelli Churn ?03/07/2022, 12:02 PM ?

## 2022-03-07 NOTE — Discharge Summary (Addendum)
?Physician Discharge Summary ?  ?Patient: [name] MRN: [id number] DOB: February 11, 1966  ?Admit date:     02/26/2022  ?Discharge date: 03/07/22  ?Discharge Physician: [name]  ? ?PCP: Pcp, No  ? ?Recommendations at discharge:  ? ?Follow-up wound care clinic as outpatient ? ?Discharge Diagnoses: ?Principal Problem: ?  Acute on chronic combined systolic and diastolic CHF (congestive heart failure) ([place]) ?Active Problems: ?  Schizoaffective disorder, bipolar type ([place]) ?  Cocaine use disorder, severe, dependence ([place]) ?  Tobacco use disorder ?  Elevated troponin ?  COPD (chronic obstructive pulmonary disease) ([place]) ?  Hyperlipidemia ?  Alcohol abuse ?  chronic ulcers ?  Seizure ([place]) ? ?Resolved Problems: ?  * No resolved hospital problems. * ? ?Hospital Course: ?56 year old male with medical history of polyneuropathy, chronic combined CHF, CAD, cocaine abuse, hyperlipidemia, hypertension, bipolar disorder, COPD presented with shortness of breath.  Patient also noticed increase swelling in his legs.  He was started on IV Lasix for possible CHF exacerbation. ? ?Assessment and Plan: ? ?COPD exacerbation ?-Patient was started on DuoNeb nebulizer every 8 hours ?-Started on Solu-Medrol 40 mg IV every 12 hours; switched to p.o. prednisone 40 mg daily ?-Prednisone has been changed to 20 mg p.o. daily ?-We will discontinue prednisone ? ?  ?Acute on chronic combined systolic and diastolic CHF ?-BNP was elevated 1573.5 ?-He was started on IV Lasix; Lasix was held due to elevated creatinine ?-Patient started on  Lasix 40 mg daily ?-BNP was elevated at 1173, cardiology was consulted ?-P.o. Lasix was changed to IV Lasix 40 mg every 12 hours ?-Had excellent diuretic response with IV Lasix ?-Net -24 L ?-Discussed with cardiology, will discharge on Lasix 40 mg p.o. twice daily ?-He will continue to take BiDil, Entresto, Coreg, Aldactone ?  ?  ?Acute kidney injury ?-Resolved, creatinine fluctuating ?-Today creatinine is 1.04 ?   ?Chronic bilateral lower extremity wounds ?-No evidence of draining wounds ?-Wound care consulted for dressing recommendations ?-Patient will follow-up with wound care clinic as outpatient ?  ?Bilateral leg pain/neuropathy ?-Started on gabapentin 300 mg p.o. 3 times daily  ?-Continue Dilaudid 1 mg IV every 4 hours as needed for pain ?-Dose of gabapentin increased to 400 mg p.o. 3 times daily ?  ?Elevated troponin ?-Troponin elevated 64, 60 ?-Likely demand ischemia ?-No chest pain ?  ?History of cocaine abuse/alcohol abuse ?-Reported use 4 days ago ?-Started on CIWA protocol ?-We will discharge on thiamine and folate ?  ?CAD ?-Restart home regimen including Coreg, Lipitor ?  ?History of seizures ?-Continue Keppra ?  ?Schizoaffective/bipolar disorder ?-Continue Zyprexa ?  ?Hypokalemia ?-Replete  ? ? ?  ? ? ?Consultants: Cardiology ?Procedures performed:  ?Disposition: Home ?Diet recommendation:  ?Discharge Diet Orders (From admission, onward)  ? ?  Start     Ordered  ? 03/07/22 0000  Diet - low sodium heart healthy       ? 03/07/22 1135  ? ?  ?  ? ?  ? ?Cardiac diet ?DISCHARGE MEDICATION: ?Allergies as of 03/07/2022   ? ?   Reactions  ? Ibuprofen Anaphylaxis  ? Acetaminophen Hives, Nausea And Vomiting, Rash  ? Pt overdosed on bottle of APAP 05/06/16  ? Lyrica Cr [pregabalin Er] Other (See Comments)  ? Dystonia and dysarthria  ? Peanut-containing Drug Products Other (See Comments)  ? Pt denies allergy to peanut butter 03/02/2021  ? ?  ? ?  ?Medication List  ?  ? ?STOP taking these medications   ? ?Incruse Ellipta  62.5 MCG/ACT Aepb ?Generic drug: umeclidinium bromide ?  ? ?  ? ?TAKE these medications   ? ?albuterol 108 (90 Base) MCG/ACT inhaler ?Commonly known as: VENTOLIN HFA ?Inhale 2 puffs into the lungs every 6 (six) hours as needed for wheezing or shortness of breath. ?  ?atorvastatin 10 MG tablet ?Commonly known as: LIPITOR ?Take 4 tablets (40 mg total) by mouth daily. ?  ?carvedilol 3.125 MG tablet ?Commonly known as:  COREG ?Take 1 tablet (3.125 mg total) by mouth 2 (two) times daily with a meal. ?What changed:  ?medication strength ?how much to take ?  ?folic acid 1 MG tablet ?Commonly known as: FOLVITE ?Take 1 tablet (1 mg total) by mouth daily. ?Start taking on: March 08, 2022 ?  ?furosemide 40 MG tablet ?Commonly known as: Lasix ?Take 1 tablet (40 mg total) by mouth 2 (two) times daily. ?  ?gabapentin 400 MG capsule ?Commonly known as: NEURONTIN ?Take 1 capsule (400 mg total) by mouth 3 (three) times daily. ?  ?hydrALAZINE 25 MG tablet ?Commonly known as: APRESOLINE ?Take 1 tablet (25 mg total) by mouth 3 (three) times daily. ?  ?isosorbide mononitrate 60 MG 24 hr tablet ?Commonly known as: IMDUR ?Take 1 tablet (60 mg total) by mouth daily. ?  ?levETIRAcetam 500 MG tablet ?Commonly known as: KEPPRA ?Take 1 tablet (500 mg total) by mouth 2 (two) times daily. ?  ?OLANZapine 5 MG tablet ?Commonly known as: ZYPREXA ?Take 1 tablet (5 mg total) by mouth daily. ?  ?oxyCODONE 5 MG immediate release tablet ?Commonly known as: Oxy IR/ROXICODONE ?Take 1-2 tablets (5-10 mg total) by mouth every 6 (six) hours as needed for severe pain or breakthrough pain. ?What changed: how much to take ?  ?Spiriva HandiHaler 18 MCG inhalation capsule ?Generic drug: tiotropium ?Place 1 capsule (18 mcg total) into inhaler and inhale daily as directed. ?  ?spironolactone 25 MG tablet ?Commonly known as: ALDACTONE ?Take 1/2 tablet (12.5 mg total) by mouth daily. ?Start taking on: March 08, 2022 ?What changed: how much to take ?  ?thiamine 100 MG tablet ?Take 1 tablet (100 mg total) by mouth daily. ?Start taking on: March 08, 2022 ?  ?valsartan 40 MG tablet ?Commonly known as: Diovan ?Take 0.5 tablets (20 mg total) by mouth 2 (two) times daily. ?  ? ?  ? ?  ?  ? ? ?  ?Discharge Care Instructions  ?(From admission, onward)  ?  ? ? ?  ? ?  Start     Ordered  ? 03/07/22 0000  Discharge wound care:       ?Comments: Follow up wound care clinic as outpatient  ?  03/07/22 1135  ? ?  ?  ? ?  ? ? Follow-up Information   ? ? [facility]              Follow up.   ?Why: 12:30 PM appointment on Wednesday ?Contact information: ?[address] ?[place] [phone] ?[phone] ? ?  ?  ? ? Minus Breeding, MD Follow up on 03/24/2022.   ?Specialty: Cardiology ?Why: Appointment at 10:20 AM ?Contact information: ?[address] ?[address] ?[place] [address] ?[phone] ? ? ?  ?  ? ?  ?  ? ?  ? ?Discharge Exam: ?Filed Weights  ? 03/03/22 0500 03/04/22 0500 03/05/22 0500  ?Weight: 94.4 kg 94.6 kg 94.1 kg  ? ?General-appears in no acute distress ?Heart-S1-S2, regular, no murmur auscultated ?Lungs-clear to  auscultation bilaterally, no wheezing or crackles auscultated ?Abdomen-soft, nontender, no organomegaly ?Extremities-no edema in the lower extremities ?Neuro-alert, oriented x3, no focal deficit noted ? ?Condition at discharge: good ? ?The results of significant diagnostics from this hospitalization (including imaging, microbiology, ancillary and laboratory) are listed below for reference.  ? ?Imaging Studies: ?DG Chest 2 View ? ?Result Date: 02/26/2022 ?CLINICAL DATA:  Shortness of breath EXAM: CHEST - 2 VIEW COMPARISON:  02/02/2022 FINDINGS: Transverse diameter of heart is increased. Central pulmonary vessels are less prominent. There are no signs of pulmonary edema or focal pulmonary consolidation. There is no pleural effusion or pneumothorax. IMPRESSION: Cardiomegaly. There are no signs of alveolar pulmonary edema or focal pulmonary consolidation. Electronically Signed   By: Elmer Picker M.D.   On: 02/26/2022 14:18  ? ?DG Ankle Complete Left ? ?Result Date: 02/13/2022 ?CLINICAL DATA:  Pain. EXAM: LEFT ANKLE COMPLETE - 3+ VIEW COMPARISON:  None. FINDINGS: There is no evidence of fracture, dislocation, or joint effusion. There is no evidence of arthropathy or other focal bone abnormality. There is diffuse soft  tissue swelling of the ankle. IMPRESSION: 1. Diffuse soft tissue swelling of the ankle. 2. No acute bony abnormality. Electronically Signed   By: Ronney Asters M.D.   On: 02/13/2022 21:00  ? ?DG Ankle Complete

## 2022-03-07 NOTE — Progress Notes (Signed)
? ?Progress Note ? ?Patient Name: [name] ?Date of Encounter: 03/07/2022 ? ?Primary Cardiologist:   Minus Breeding, MD ? ? ?Subjective  ? ?He is agitated and anxious to leave ? ?Inpatient Medications  ?  ?Scheduled Meds: ? atorvastatin  40 mg Oral Daily  ? carvedilol  3.125 mg Oral BID WC  ? enoxaparin (LOVENOX) injection  40 mg Subcutaneous [id number]  ? folic acid  1 mg Oral Daily  ? furosemide  40 mg Intravenous [id number]  ? gabapentin  400 mg Oral TID  ? guaiFENesin  600 mg Oral BID  ? ipratropium-albuterol  3 mL Nebulization TID  ? isosorbide-hydrALAZINE  1 tablet Oral TID  ? levETIRAcetam  500 mg Oral BID  ? multivitamin with minerals  1 tablet Oral Daily  ? mupirocin cream   Topical Daily  ? OLANZapine  5 mg Oral Daily  ? predniSONE  20 mg Oral Q breakfast  ? sacubitril-valsartan  1 tablet Oral BID  ? spironolactone  12.5 mg Oral Daily  ? thiamine  100 mg Oral Daily  ? Or  ? thiamine  100 mg Intravenous Daily  ? traZODone  25 mg Oral Once  ? ?Continuous Infusions: ? ?PRN Meds: ?albuterol, hydrALAZINE, morphine injection  ? ?Vital Signs  ?  ?Vitals:  ? 03/06/22 2121 03/07/22 0005 03/07/22 0525 03/07/22 0845  ?BP: (!) 154/85  93/78   ?Pulse: 99  98   ?Resp: 19  15   ?Temp: 98.2 ?F (36.8 ?C)     ?TempSrc: Oral     ?SpO2: 97% 98% 100% 97%  ?Weight:      ?Height:      ? ? ?Intake/Output Summary (Last 24 hours) at 03/07/2022 1107 ?Last data filed at 03/07/2022 0544 ?Gross per 24 hour  ?Intake 840 ml  ?Output 4300 ml  ?Net -3460 ml  ? ?Filed Weights  ? 03/03/22 0500 03/04/22 0500 03/05/22 0500  ?Weight: 94.4 kg 94.6 kg 94.1 kg  ? ? ?Telemetry  ?  ?NSR, sinus tach  - Personally Reviewed ? ?ECG  ?  ?NA - Personally Reviewed ? ?Physical Exam  ? ?GEN: No acute distress.   ?Neck: No  JVD ?Cardiac: RRR, no murmurs, rubs, or gallops.  ?Respiratory: Clear  to auscultation bilaterally. ?GI: Soft, nontender, non-distended  ?MS: No  edema; No deformity. ?Neuro:  Nonfocal  ?Psych: Normal affect  ? ?Labs  ?  ?Chemistry ?Recent Labs   ?Lab 03/05/22 ?0338 03/06/22 ?0326 03/07/22 ?0351  ?NA 143 141 138  ?K 3.3* 3.6 3.9  ?CL 102 101 102  ?CO2 31 29 25   ?GLUCOSE 166* 202* 278*  ?BUN 26* 32* 30*  ?CREATININE 1.37* 1.07 1.04  ?CALCIUM 8.4* 8.6* 8.8*  ?GFRNONAA >60 >60 >60  ?ANIONGAP 10 11 11   ?  ? ?HematologyNo results for input(s): WBC, RBC, HGB, HCT, MCV, MCH, MCHC, RDW, PLT in the last 168 hours. ? ?Cardiac EnzymesNo results for input(s): TROPONINI in the last 168 hours. No results for input(s): TROPIPOC in the last 168 hours.  ? ?BNP ?Recent Labs  ?Lab 03/03/22 ?[id number]  ?BNP 1,173.6*  ?  ? ?DDimer No results for input(s): DDIMER in the last 168 hours.  ? ?Radiology  ?  ?No results found. ? ?Cardiac Studies  ? ?Echo: ? ?1. LV apical false tendon (normal variant). Left ventricular ejection  ?fraction, by estimation, is <20%. The left ventricle has severely  ?decreased function. The left ventricle demonstrates global hypokinesis.  ?The left ventricular internal cavity size was  ?  mildly dilated. There is mild left ventricular hypertrophy. Left  ?ventricular diastolic parameters are consistent with Grade III diastolic  ?dysfunction (restrictive). Elevated left ventricular end-diastolic  ?pressure. The E/e' is 22.  ? 2. Right ventricular systolic function is mildly reduced. The right  ?ventricular size is mildly enlarged. There is moderately elevated  ?pulmonary artery systolic pressure. The estimated right ventricular  ?systolic pressure is [id number] mmHg.  ? 3. Left atrial size was moderately dilated.  ? 4. Right atrial size was moderately dilated.  ? 5. The mitral valve is abnormal. Mild to moderate mitral valve  ?regurgitation.  ? 6. The aortic valve is tricuspid. Aortic valve regurgitation is not  ?visualized. Aortic valve sclerosis is present, with no evidence of aortic  ?valve stenosis.  ? 7. Mildly dilated pulmonary artery.  ? 8. The inferior vena cava is normal in size with <50% respiratory  ?variability, suggesting right atrial pressure of 8 mmHg.   ? ?Patient Profile  ?   ?56 y.o. 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 Percival Spanish. ? ?Assessment & Plan  ?  ?ACUTE ON CHRONIC SYSTOLIC AND DIASTOLIC:    Net negative 24 liters this admission.  I spoke in person with his out patient Education officer, museum.  She tries to get him to appts.  She works to help him have meds.  He has meals.  He understands my salt and fluid restrictions.  I would suggest continuing meds as on MAR.  He could take all of the Lasix at once if he prefers.   ? ?CAD:  Medical management ? ?HTN:  This is being managed in the context of treating his CHF    ? ?AKI:   Resolved with diuresis.      ? ?For questions or updates, please contact [facility] ?Please consult [email or web address] for contact info under Cardiology/STEMI. ?  ?Signed, ?Minus Breeding, MD  ?03/07/2022, 11:07 AM   ? ?

## 2022-03-07 NOTE — TOC Transition Note (Signed)
Transition of Care (TOC) - CM/SW Discharge Note ? ? ?Patient Details  ?Name: [name] ?MRN: [id number] ?Date of Birth: November 17, 1966 ? ?Transition of Care (TOC) CM/SW Contact:  ?Trish Mage, [name] ?Phone Number: ?03/07/2022, 1:15 PM ? ? ?Clinical Narrative:   Patient who is stable for d/c does not have any bed offers, despite my update today letting them know that his  MCD is legit. Called his "homeless IT sales professional" [name] at [phone] to update her and ask for help with transportation. She offered a bus pass.  I spoke with [name], who gave me a number for [name] [phone] that he says is his MCD transport agency.  They explained they can only transport for scheduled medical appointments that are set up through [place] worker.  When I informed [name], he asked for a bus pass to get to [name].  Given. He will follow up with [name] on 4/12 at 12:30 as set up by my colleague. No further needs identified. TOC sign off. ? ? ? ?Final next level of care:  [facility]) ?Barriers to Discharge: Barriers Resolved ? ? ?Patient Goals and CMS Choice ?Patient states their goals for this hospitalization and ongoing recovery are:: To feel better ?CMS Medicare.gov Compare Post Acute Care list provided to:: Patient ?Choice offered to / list presented to : Patient ? ?Discharge Placement ?  ?           ?  ?  ?  ?  ? ?Discharge Plan and Services ?  ?  ?           ?  ?  ?  ?  ?  ?  ?  ?  ?  ?  ? ?Social Determinants of Health (SDOH) Interventions ?  ? ? ?Readmission Risk Interventions ? ?  02/04/2022  ?  4:41 PM 12/05/2021  ? 11:48 AM 03/05/2021  ?  2:42 PM  ?Readmission Risk Prevention Plan  ?Transportation Screening Complete Complete Complete  ?Medication Review Press photographer) Complete Not Complete Complete  ?PCP or Specialist appointment within 3-5 days of discharge Complete Not Complete Complete  ?[place] or Home Care Consult Complete Not Complete Complete  ?SW Recovery Care/Counseling  Consult Complete Not Complete Complete  ?Palliative Care Screening Not Applicable Not Applicable Not Applicable  ?Skilled Nursing Facility Complete Not Applicable Complete  ? ? ? ? ? ?

## 2022-03-07 NOTE — Plan of Care (Signed)
  Problem: Education: Goal: Ability to demonstrate management of disease process will improve Outcome: Adequate for Discharge Goal: Ability to verbalize understanding of medication therapies will improve Outcome: Adequate for Discharge Goal: Individualized Educational Video(s) Outcome: Adequate for Discharge   Problem: Activity: Goal: Capacity to carry out activities will improve Outcome: Adequate for Discharge   Problem: Cardiac: Goal: Ability to achieve and maintain adequate cardiopulmonary perfusion will improve Outcome: Adequate for Discharge   Problem: Education: Goal: Knowledge of General Education information will improve Description: Including pain rating scale, medication(s)/side effects and non-pharmacologic comfort measures Outcome: Adequate for Discharge   Problem: Health Behavior/Discharge Planning: Goal: Ability to manage health-related needs will improve Outcome: Adequate for Discharge   Problem: Clinical Measurements: Goal: Ability to maintain clinical measurements within normal limits will improve Outcome: Adequate for Discharge Goal: Will remain free from infection Outcome: Adequate for Discharge Goal: Diagnostic test results will improve Outcome: Adequate for Discharge Goal: Respiratory complications will improve Outcome: Adequate for Discharge Goal: Cardiovascular complication will be avoided Outcome: Adequate for Discharge   Problem: Activity: Goal: Risk for activity intolerance will decrease Outcome: Adequate for Discharge   Problem: Nutrition: Goal: Adequate nutrition will be maintained Outcome: Adequate for Discharge   Problem: Coping: Goal: Level of anxiety will decrease Outcome: Adequate for Discharge   Problem: Elimination: Goal: Will not experience complications related to bowel motility Outcome: Adequate for Discharge Goal: Will not experience complications related to urinary retention Outcome: Adequate for Discharge   Problem: Pain  Managment: Goal: General experience of comfort will improve Outcome: Adequate for Discharge   Problem: Safety: Goal: Ability to remain free from injury will improve Outcome: Adequate for Discharge   Problem: Skin Integrity: Goal: Risk for impaired skin integrity will decrease Outcome: Adequate for Discharge   

## 2022-03-07 NOTE — Progress Notes (Signed)
Pt stated that if I want to have a good day I just need to come in and give him his meds and not to speak with him.  He became angry when receiving IV lasix and said I am the only person who pushes it 2-5 min and he wanted me to remove it from the iv hub and stop giving it.  Dr Darrick Meigs advised. ?

## 2022-03-10 ENCOUNTER — Telehealth (HOSPITAL_COMMUNITY): Payer: Self-pay | Admitting: Licensed Clinical Social Worker

## 2022-03-10 NOTE — Progress Notes (Signed)
Heart Failure Nurse Navigator Progress Note  ? ?Called and left a detailed voicemail for patient from a referral from Dr. Percival Spanish to work on helping patient get some medication assistance. He was recently discharged from the hospital on 03/07/22.  ? ?Earnestine Leys, BSN, RN ?Heart Failure Nurse Navigator ?Secure Chat Only  ?

## 2022-03-10 NOTE — Telephone Encounter (Signed)
CSW received referral from Dr Caprice Red to reach out to patient who was hospitalized last week at [facility] to inquire about affordability of medications. CSW contacted patient and left message for return call. Lasandra Beech, [name], CCSW-MCS [phone] ? ?

## 2022-03-12 ENCOUNTER — Encounter (HOSPITAL_BASED_OUTPATIENT_CLINIC_OR_DEPARTMENT_OTHER): Payer: Medicaid Other | Attending: Physician Assistant | Admitting: Physician Assistant

## 2022-03-12 DIAGNOSIS — E11621 Type 2 diabetes mellitus with foot ulcer: Secondary | ICD-10-CM | POA: Diagnosis present

## 2022-03-12 DIAGNOSIS — I5042 Chronic combined systolic (congestive) and diastolic (congestive) heart failure: Secondary | ICD-10-CM | POA: Insufficient documentation

## 2022-03-12 DIAGNOSIS — E1151 Type 2 diabetes mellitus with diabetic peripheral angiopathy without gangrene: Secondary | ICD-10-CM | POA: Insufficient documentation

## 2022-03-12 DIAGNOSIS — L97822 Non-pressure chronic ulcer of other part of left lower leg with fat layer exposed: Secondary | ICD-10-CM | POA: Diagnosis not present

## 2022-03-12 DIAGNOSIS — J449 Chronic obstructive pulmonary disease, unspecified: Secondary | ICD-10-CM | POA: Diagnosis not present

## 2022-03-12 DIAGNOSIS — L97312 Non-pressure chronic ulcer of right ankle with fat layer exposed: Secondary | ICD-10-CM | POA: Insufficient documentation

## 2022-03-12 DIAGNOSIS — Z5900 Homelessness unspecified: Secondary | ICD-10-CM | POA: Insufficient documentation

## 2022-03-12 DIAGNOSIS — L97512 Non-pressure chronic ulcer of other part of right foot with fat layer exposed: Secondary | ICD-10-CM | POA: Insufficient documentation

## 2022-03-12 DIAGNOSIS — I110 Hypertensive heart disease with heart failure: Secondary | ICD-10-CM | POA: Diagnosis not present

## 2022-03-12 NOTE — Progress Notes (Addendum)
[place], [name] ([id number]) ?Visit Report for 03/12/2022 ?Chief Complaint Document Details ?Patient Name: Date of Service: ?[facility] [place] 03/12/2022 12:30 PM ?Medical Record Number: [id number] ?Patient Account Number: [id number] ?Date of Birth/Sex: Treating RN: ?21-Jun-1966 (56 y.o. Lytle Michaels ?Primary Care Provider: PCP, NO Other Clinician: ?Referring Provider: ?Treating Provider/Extender: Lenda Kelp ?Weeks in Treatment: 10 ?Information Obtained from: Patient ?Chief Complaint ?Bilateral LE Ulcers ?Electronic Signature(s) ?Signed: 03/12/2022 1:02:01 PM By: Lenda Kelp PA-C ?Entered By: Lenda Kelp on 03/12/2022 13:02:01 ?-------------------------------------------------------------------------------- ?HPI Details ?Patient Name: Date of Service: ?[facility] [place], [place] 03/12/2022 12:30 PM ?Medical Record Number: [id number] ?Patient Account Number: [id number] ?Date of Birth/Sex: Treating RN: ?07/25/1966 (56 y.o. Lytle Michaels ?Primary Care Provider: PCP, NO Other Clinician: ?Referring Provider: ?Treating Provider/Extender: Lenda Kelp ?Weeks in Treatment: 10 ?History of Present Illness ?HPI Description: 01/01/2022 upon evaluation today patient is a pleasant gentleman who presents today for evaluation here in the clinic initially. Subsequently he ?is having a lot of issues here with wounds on his bilateral lower extremities that appear to be for the most part venous in nature. I do not see any signs of ?infection currently although he has had cellulitis a couple times. He does have several contributing factors to this he has good arterial flow I did review his ?arterial study which showed an ABI on the right of 1.03 and on the left of 1.02 this was May 2022. Previous that his right ABI was 1.15 and left 1.07. He seems ?to have good blood flow today as well and I see no changes or shifts here to be perfectly honest. That is good news. Nonetheless he does have some chronic ?medical conditions that are  affecting the situation namely his congestive heart failure I will detail this right below. ?Patient does have a history of diabetes mellitus type 2 although his A1c is 5.8 on most recent check and seems to be doing okay he has not taken any ?medication at this point though previously he was on metformin. He also has a history of congestive heart failure with ejection fraction of 25%, COPD, and ?hypertension. ?Of note also the patient is currently homeless he is sleeping outside in a sleeping bag. This is definitely not a common complaint as far as his treatment plan is ?concerned. He did have Medicaid in [place] but recently moved to [place] area. His Medicaid has not flipped over yet although he does have a ?Child psychotherapist with him today who is working on that aspect. ?01/08/2022 upon evaluation today patient appears to be doing decently well in regard to his wound. He has been tolerating the dressing changes without ?complication. He did have to go to the hospital to have his wraps removed last week as a starting to hurt I think it may have been that the dressings were ?getting somewhat dry. We will see what I can do today to try to prevent that from happening. ?01/22/2022 upon evaluation today patient appears to be doing much better in regard to his wounds. He is actually making excellent progress. Fortunately I do ?not see any signs of active infection locally or systemically which is great news. No fevers, chills, nausea, vomiting, or diarrhea. ?02/26/2022 upon evaluation today patient unfortunately appears to be doing worse its been since February 22 that I last saw him that has been almost 5 weeks. ?With that being said he does seem to be doing significantly worse compared to previous. Fortunately I do not see any  signs of active infection locally or ?systemically at this time which is great news. No fevers, chills, nausea, vomiting, or diarrhea. Nonetheless he tells me that he has been having a lot  of trouble ?with shortness of breath. He tells me that his sleeping has been difficult. He is homeless and unfortunately just is not as healthy as what would like to see. 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 With that being said he is significantly swollen in his legs even compared to ?what I have seen before coupled with the shortness of breath I am concerned that his heart failure may be worsening I have concerns that he probably needs ?to go to the ER for further evaluation. ?03-12-2022 upon evaluation today patient appears to be doing well with regard to the overall appearance of his wounds. Unfortunately he tells me that he still ?having significant pain. Overall I think that he is getting need to have some type of dressing on this he just had an open air since Monday this is obviously not ?good with the wounds get dry explained to him there can hurt significantly worse. ?Electronic Signature(s) ?Signed: 03/12/2022 2:00:31 PM By: Lenda Kelp PA-C ?Entered By: Lenda Kelp on 03/12/2022 14:00:31 ?-------------------------------------------------------------------------------- ?Physical Exam Details ?Patient Name: Date of Service: ?[facility] [place] 03/12/2022 12:30 PM ?Medical Record Number: [id number] ?Patient Account Number: [id number] ?Date of Birth/Sex: Treating RN: ?[birth year] (56 y.o. Lytle Michaels ?Primary Care Provider: PCP, NO Other Clinician: ?Referring Provider: ?Treating Provider/Extender: Lenda Kelp ?Weeks in Treatment: 10 ?Constitutional ?Well-nourished and well-hydrated in no acute distress. ?Respiratory ?normal breathing without difficulty. ?Psychiatric ?this patient is able to make decisions and demonstrates good insight into disease process. Alert and Oriented x 3. pleasant and cooperative. ?Notes ?Upon inspection patient's wounds again showed signs of being mainly dry with some drainage and dry skin in fact overall the wounds appear to  be doing better ?than when I first saw him although he tells me the pain is significantly worse. There is no evidence of active infection at this time he has been to the ER ?multiple times they give him some pain medication occasionally but for the most part he tells me he still does not have anything for pain relief when he is not ?there. He does have an appointment with the pain clinic next Tuesday. ?Electronic Signature(s) ?Signed: 03/12/2022 2:01:04 PM By: Lenda Kelp PA-C ?Entered By: Lenda Kelp on 03/12/2022 14:01:03 ?-------------------------------------------------------------------------------- ?Physician Orders Details ?Patient Name: Date of Service: ?[facility] [place], [place] 03/12/2022 12:30 PM ?Medical Record Number: [id number] ?Patient Account Number: [id number] ?Date of Birth/Sex: Treating RN: ?November 02, 1966 (56 y.o. Lytle Michaels ?Primary Care Provider: PCP, NO Other Clinician: ?Referring Provider: ?Treating Provider/Extender: Lenda Kelp ?Weeks in Treatment: 10 ?Verbal / Phone Orders: No ?Diagnosis Coding ?ICD-10 Coding ?Code Description ?E11.621 Type 2 diabetes mellitus with foot ulcer ?L97.512 Non-pressure chronic ulcer of other part of right foot with fat layer exposed ?L97.312 Non-pressure chronic ulcer of right ankle with fat layer exposed ?N23.557 Non-pressure chronic ulcer of other part of left lower leg with fat layer exposed ?I50.42 Chronic combined systolic (congestive) and diastolic (congestive) heart failure ?J44.9 Chronic obstructive pulmonary disease, unspecified ?I10 Essential (primary) hypertension ?Follow-up Appointments ?ppointment in 1 week. - Wednesday with Leonard Schwartz, PA ?Return A ?Other: - Pain clinic appt 03/18/22 ?***Patient to purchase compression stockings. leg measurements provided. bring in next week.*** ?Bathing/ Shower/ Hygiene ?May shower with  protection but do not get wound dressing(s) wet. ?Edema Control - Lymphedema / SCD / Other ?Elevate legs to the level of the heart or  above for 30 minutes daily and/or when sitting, a frequency of: - 3-4 times a day throughout the day. ?Avoid standing for long periods of time. ?Exercise regularly ?Additional Orders / Instructions ?Stop/

## 2022-03-12 NOTE — Progress Notes (Addendum)
[name], [name] ([id number]) ?Visit Report for 03/12/2022 ?Arrival Information Details ?Patient Name: Date of Service: ?[place], [place] 03/12/2022 12:30 PM ?Medical Record Number: [id number] ?Patient Account Number: [id number] ?Date of Birth/Sex: Treating RN: ?1966/03/31 (56 y.o. [name] ?Primary Care Alyssamae Klinck: PCP, NO Other Clinician: ?Referring Misty Rago: ?Treating Sotiria Keast/Extender: Worthy Keeler ?Weeks in Treatment: 10 ?Visit Information History Since Last Visit ?Added or deleted any medications: No ?Patient Arrived: Wheel Chair ?Any new allergies or adverse reactions: No ?Arrival Time: 12:44 ?Had a fall or experienced change in No ?Transfer Assistance: Manual ?activities of daily living that may affect ?Patient Identification Verified: Yes ?risk of falls: ?Secondary Verification Process Completed: Yes ?Signs or symptoms of abuse/neglect since last visito No ?Patient Requires Transmission-Based No ?Hospitalized since last visit: Yes ?Precautions: ?Implantable device outside of the clinic excluding No ?Patient Has Alerts: Yes ?cellular tissue based products placed in the center ?Patient Alerts: ?04/23/21 ABI: L 1.02 ?since last visit: ?R1.03 ?Has Dressing in Place as Prescribed: No ?Has Compression in Place as Prescribed: No ?Pain Present Now: Yes ?Electronic Signature(s) ?Signed: 03/12/2022 5:22:42 PM By: Lorrin Jackson ?Entered By: Lorrin Jackson on 03/12/2022 12:44:55 ?-------------------------------------------------------------------------------- ?Clinic Level of Care Assessment Details ?Patient Name: Date of Service: ?[name], [name] 03/12/2022 12:30 PM ?Medical Record Number: [id number] ?Patient Account Number: [id number] ?Date of Birth/Sex: Treating RN: ?12-03-1965 (56 y.o. [name] ?Primary Care Kajol Crispen: PCP, NO Other Clinician: ?Referring Gypsy Kellogg: ?Treating Refael Fulop/Extender: Worthy Keeler ?Weeks in Treatment: 10 ?Clinic Level of Care Assessment Items ?TOOL 4 Quantity Score ?X- 1 0 ?Use  when only an EandM is performed on FOLLOW-UP visit ?ASSESSMENTS - Nursing Assessment / Reassessment ?X- 1 10 ?Reassessment of Co-morbidities (includes updates in patient status) ?X- 1 5 ?Reassessment of Adherence to Treatment Plan ?ASSESSMENTS - Wound and Skin A ssessment / Reassessment ?[]  - 0 ?Simple Wound Assessment / Reassessment - one wound ?X- 5 5 ?Complex Wound Assessment / Reassessment - multiple wounds ?[]  - 0 ?Dermatologic / Skin Assessment (not related to wound area) ?ASSESSMENTS - Focused Assessment ?[]  - 0 ?Circumferential Edema Measurements - multi extremities ?[]  - 0 ?Nutritional Assessment / Counseling / Intervention ?[]  - 0 ?Lower Extremity Assessment (monofilament, tuning fork, pulses) ?[]  - 0 ?Peripheral Arterial Disease Assessment (using hand held doppler) ?ASSESSMENTS - Ostomy and/or Continence Assessment and Care ?[]  - 0 ?Incontinence Assessment and Management ?[]  - 0 ?Ostomy Care Assessment and Management (repouching, etc.) ?PROCESS - Coordination of Care ?[]  - 0 ?Simple Patient / Family Education for ongoing care ?X- 1 20 ?Complex (extensive) Patient / Family Education for ongoing care ?X- 1 10 ?Staff obtains Consents, Records, T Results / Process Orders ?est ?X- 1 10 ?Staff telephones HHA, Nursing Homes / Clarify orders / etc ?[]  - 0 ?Routine Transfer to another Facility (non-emergent condition) ?[]  - 0 ?Routine Hospital Admission (non-emergent condition) ?[]  - 0 ?New Admissions / Biomedical engineer / Ordering NPWT Apligraf, etc. ?, ?[]  - 0 ?Emergency Hospital Admission (emergent condition) ?[]  - 0 ?Simple Discharge Coordination ?[]  - 0 ?Complex (extensive) Discharge Coordination ?PROCESS - Special Needs ?[]  - 0 ?Pediatric / Minor Patient Management ?[]  - 0 ?Isolation Patient Management ?[]  - 0 ?Hearing / Language / Visual special needs ?[]  - 0 ?Assessment of Community assistance (transportation, D/C planning, etc.) ?[]  - 0 ?Additional assistance / Altered mentation ?[]  - 0 ?Support  Surface(s) Assessment (bed, cushion, seat, etc.) ?INTERVENTIONS - Wound Cleansing / Measurement ?[]  - 0 ?Simple Wound Cleansing - one wound ?X- 5 5 ?Complex Wound  Cleansing - multiple wounds ?X- 1 5 ?Wound Imaging (photographs - any number of wounds) ?[]  - 0 ?Wound Tracing (instead of photographs) ?[]  - 0 ?Simple Wound Measurement - one wound ?X- 5 5 ?Complex Wound Measurement - multiple wounds ?INTERVENTIONS - Wound Dressings ?[]  - 0 ?Small Wound Dressing one or multiple wounds ?[]  - 0 ?Medium Wound Dressing one or multiple wounds ?X- 2 20 ?Large Wound Dressing one or multiple wounds ?[]  - 0 ?Application of Medications - topical ?[]  - 0 ?Application of Medications - injection ?INTERVENTIONS - Miscellaneous ?[]  - 0 ?External ear exam ?[]  - 0 ?Specimen Collection (cultures, biopsies, blood, body fluids, etc.) ?[]  - 0 ?Specimen(s) / Culture(s) sent or taken to Lab for analysis ?[]  - 0 ?Patient Transfer (multiple staff / Civil Service fast streamer / Similar devices) ?[]  - 0 ?Simple Staple / Suture removal (25 or less) ?[]  - 0 ?Complex Staple / Suture removal (26 or more) ?[]  - 0 ?Hypo / Hyperglycemic Management (close monitor of Blood Glucose) ?[]  - 0 ?Ankle / Brachial Index (ABI) - do not check if billed separately ?X- 1 5 ?Vital Signs ?Has the patient been seen at the hospital within the last three years: Yes ?Total Score: 180 ?Level Of Care: New/Established - Level 5 ?Electronic Signature(s) ?Signed: 03/12/2022 5:22:42 PM By: Lorrin Jackson ?Entered By: Lorrin Jackson on 03/12/2022 13:08:40 ?-------------------------------------------------------------------------------- ?Encounter Discharge Information Details ?Patient Name: Date of Service: ?[name], [name] 03/12/2022 12:30 PM ?Medical Record Number: [id number] ?Patient Account Number: [id number] ?Date of Birth/Sex: Treating RN: ?1966-11-14 (56 y.o. [name] ?Primary Care Severiano Utsey: PCP, NO Other Clinician: ?Referring Rien Marland: ?Treating Stylianos Stradling/Extender: Worthy Keeler ?Weeks in Treatment: 10 ?Encounter Discharge Information Items ?Discharge Condition: Stable ?Ambulatory Status: Wheelchair ?Discharge Destination: Other (Note Required) ?Transportation: Private Auto ?Schedule Follow-up Appointment: Yes ?Clinical Summary of Care: Provided on 03/12/2022 ?Form Type Recipient ?Paper Patient Patient ?Notes ?Patient is homeless. He has Education officer, museum looking for housing. ?Electronic Signature(s) ?Signed: 03/12/2022 5:22:42 PM By: Lorrin Jackson ?Entered By: Lorrin Jackson on 03/12/2022 13:34:20 ?-------------------------------------------------------------------------------- ?Lower Extremity Assessment Details ?Patient Name: Date of Service: ?[place], [place] 03/12/2022 12:30 PM ?Medical Record Number: [id number] ?Patient Account Number: [id number] ?Date of Birth/Sex: Treating RN: ?July 07, 1966 (56 y.o. [name] ?Primary Care Zahira Brummond: PCP, NO Other Clinician: ?Referring Shaune Malacara: ?Treating Dilana Mcphie/Extender: Worthy Keeler ?Weeks in Treatment: 10 ?Edema Assessment ?Assessed: [Left: Yes] [Right: Yes] ?Edema: [Left: Yes] [Right: Yes] ?Calf ?Left: Right: ?Point of Measurement: 36 cm From Medial Instep 40 cm 42 cm ?Ankle ?Left: Right: ?Point of Measurement: 13 cm From Medial Instep 26 cm 26 cm ?Vascular Assessment ?Pulses: ?Dorsalis Pedis ?Palpable: [Left:Yes] [Right:Yes] ?Electronic Signature(s) ?Signed: 03/12/2022 5:22:42 PM By: Lorrin Jackson ?Entered By: Lorrin Jackson on 03/12/2022 12:47:06 ?-------------------------------------------------------------------------------- ?Multi-Disciplinary Care Plan Details ?Patient Name: ?Date of Service: ?Valent[place], Ritchey 03/12/2022 12:30 PM ?Medical Record Number: [id number] ?Patient Account Number: [id number] ?Date of Birth/Sex: ?Treating RN: ?March 05, 1966 (56 y.o. [name] ?Primary Care Shahmeer Bunn: PCP, NO ?Other Clinician: ?Referring Raley Novicki: ?Treating Peirce Deveney/Extender: Worthy Keeler ?Weeks in Treatment: 10 ?Multidisciplinary Care  Plan reviewed with physician ?Active Inactive ?Electronic Signature(s) ?Signed: 04/11/2022 10:36:02 AM By: Lorrin Jackson ?Previous Signature: 03/12/2022 5:22:42 PM Version By: Lorrin Jackson ?Entered By: Onnie Boer,

## 2022-03-17 ENCOUNTER — Emergency Department (HOSPITAL_COMMUNITY)
Admission: EM | Admit: 2022-03-17 | Discharge: 2022-03-17 | Disposition: A | Discharge status: Home / Self Care | Payer: Medicaid Other | Attending: Emergency Medicine | Admitting: Emergency Medicine

## 2022-03-17 ENCOUNTER — Emergency Department (HOSPITAL_COMMUNITY): Payer: Medicaid Other

## 2022-03-17 DIAGNOSIS — Z79899 Other long term (current) drug therapy: Secondary | ICD-10-CM | POA: Diagnosis not present

## 2022-03-17 DIAGNOSIS — J449 Chronic obstructive pulmonary disease, unspecified: Secondary | ICD-10-CM | POA: Diagnosis not present

## 2022-03-17 DIAGNOSIS — M79604 Pain in right leg: Secondary | ICD-10-CM | POA: Insufficient documentation

## 2022-03-17 DIAGNOSIS — I509 Heart failure, unspecified: Secondary | ICD-10-CM | POA: Diagnosis not present

## 2022-03-17 DIAGNOSIS — E119 Type 2 diabetes mellitus without complications: Secondary | ICD-10-CM | POA: Insufficient documentation

## 2022-03-17 DIAGNOSIS — I110 Hypertensive heart disease with heart failure: Secondary | ICD-10-CM | POA: Insufficient documentation

## 2022-03-17 DIAGNOSIS — Z91010 Allergy to peanuts: Secondary | ICD-10-CM | POA: Diagnosis not present

## 2022-03-17 DIAGNOSIS — M79605 Pain in left leg: Secondary | ICD-10-CM | POA: Diagnosis not present

## 2022-03-17 LAB — BASIC METABOLIC PANEL
Anion gap: 6 (ref 5–15)
BUN: 8 mg/dL (ref 6–20)
CO2: 28 mmol/L (ref 22–32)
Calcium: 8.4 mg/dL — ABNORMAL LOW (ref 8.9–10.3)
Chloride: 102 mmol/L (ref 98–111)
Creatinine, Ser: 0.92 mg/dL (ref 0.61–1.24)
GFR, Estimated: >60 mL/min (ref >60)
Glucose, Bld: 80 mg/dL (ref 70–99)
Potassium: 3.9 mmol/L (ref 3.5–5.1)
Sodium: 136 mmol/L (ref 135–145)

## 2022-03-17 LAB — CBC
HCT: 38.3 % — ABNORMAL LOW (ref 39.0–52.0)
Hemoglobin: 12.6 g/dL — ABNORMAL LOW (ref 13.0–17.0)
MCH: 31 pg (ref 26.0–34.0)
MCHC: 32.9 g/dL (ref 30.0–36.0)
MCV: 94.3 fL (ref 80.0–100.0)
Platelets: 286 10*3/uL (ref 150–400)
RBC: 4.06 MIL/uL — ABNORMAL LOW (ref 4.22–5.81)
RDW: 13.8 % (ref 11.5–15.5)
WBC: 8.3 10*3/uL (ref 4.0–10.5)
nRBC: 0 % (ref 0.0–0.2)

## 2022-03-17 IMAGING — CR DG CHEST 2V
2 series · 2 of 2 positions shown · non-contrast
Comparison: Is [DATE]

CLINICAL DATA: CHF

EXAM:
CHEST - 2 VIEW

[chest lat]
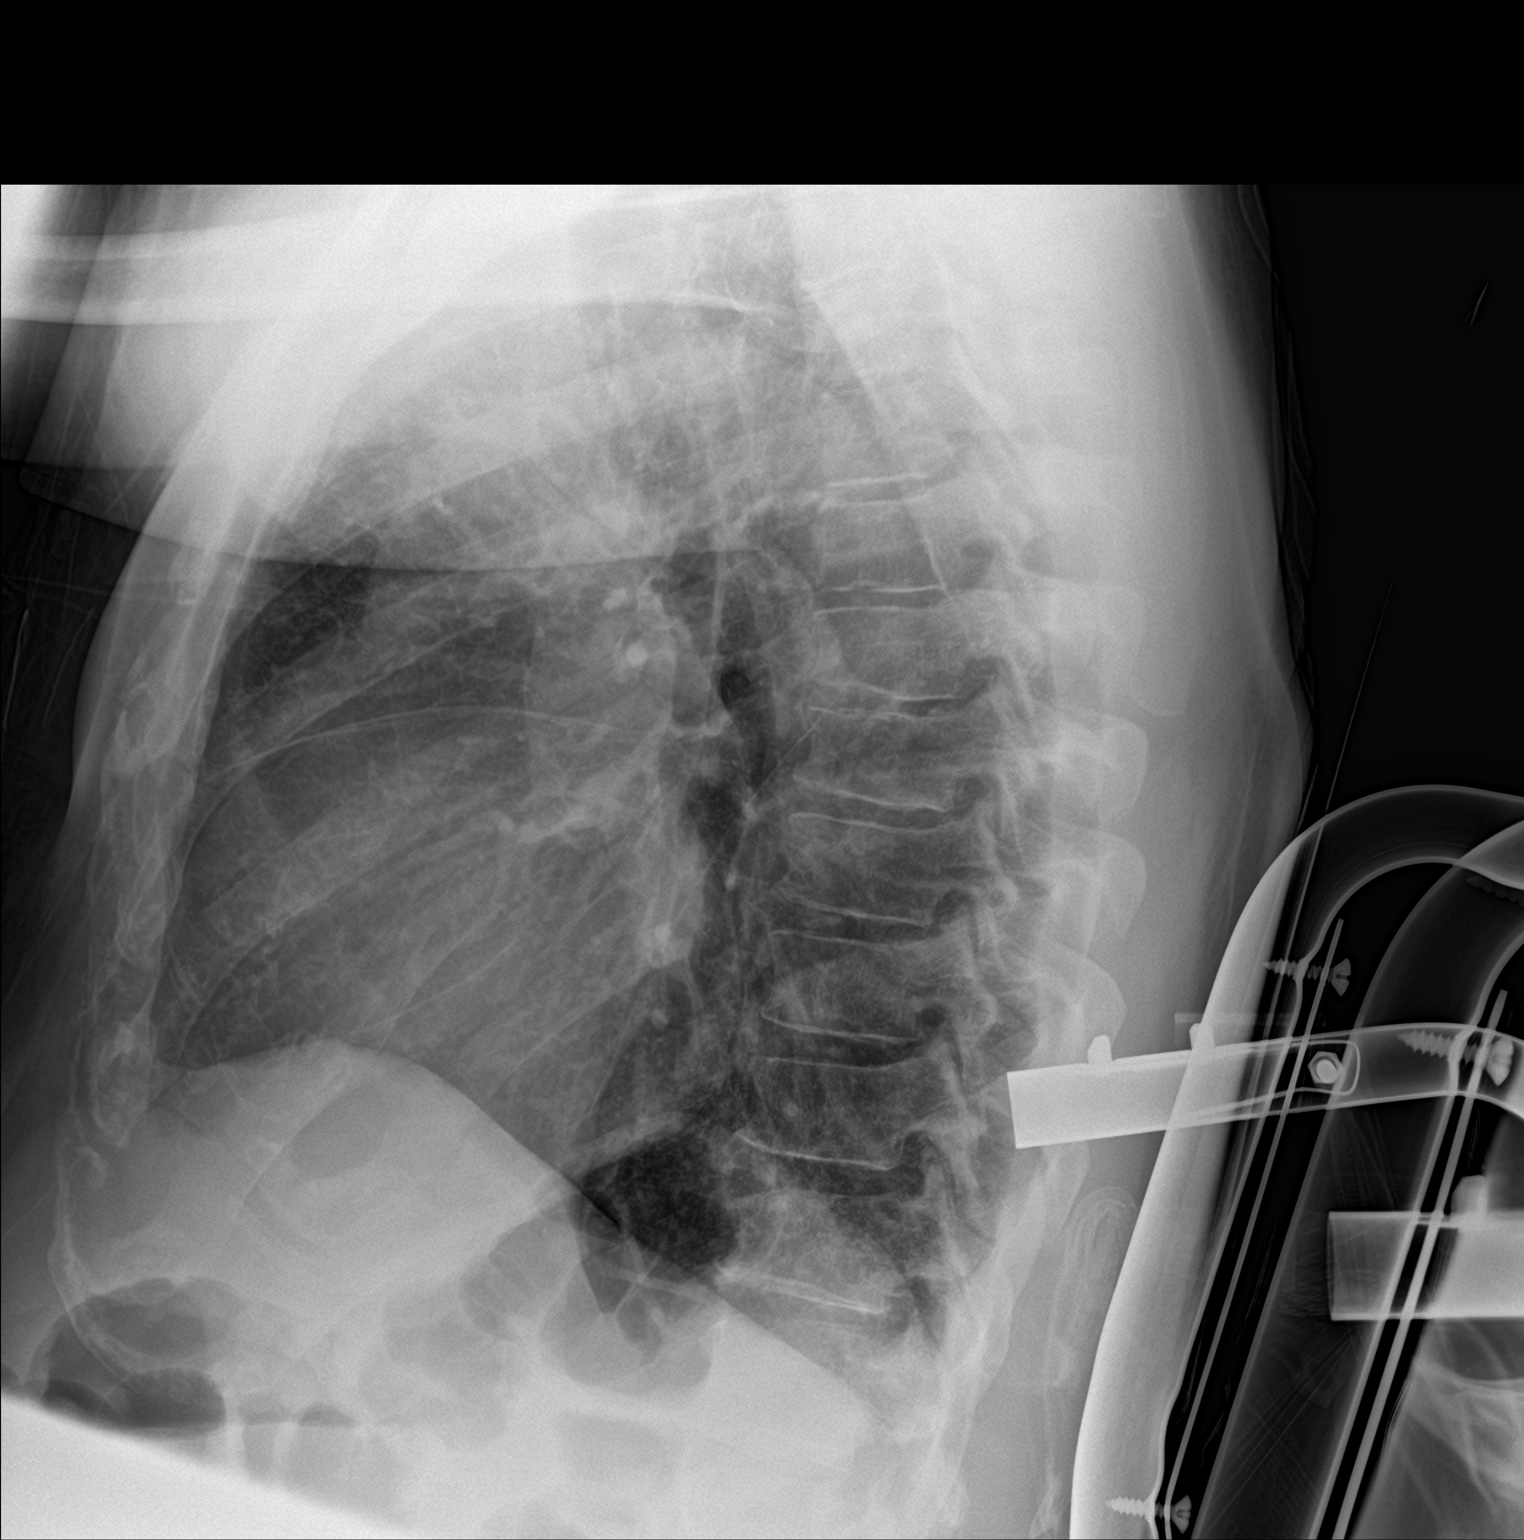

[chest ap]
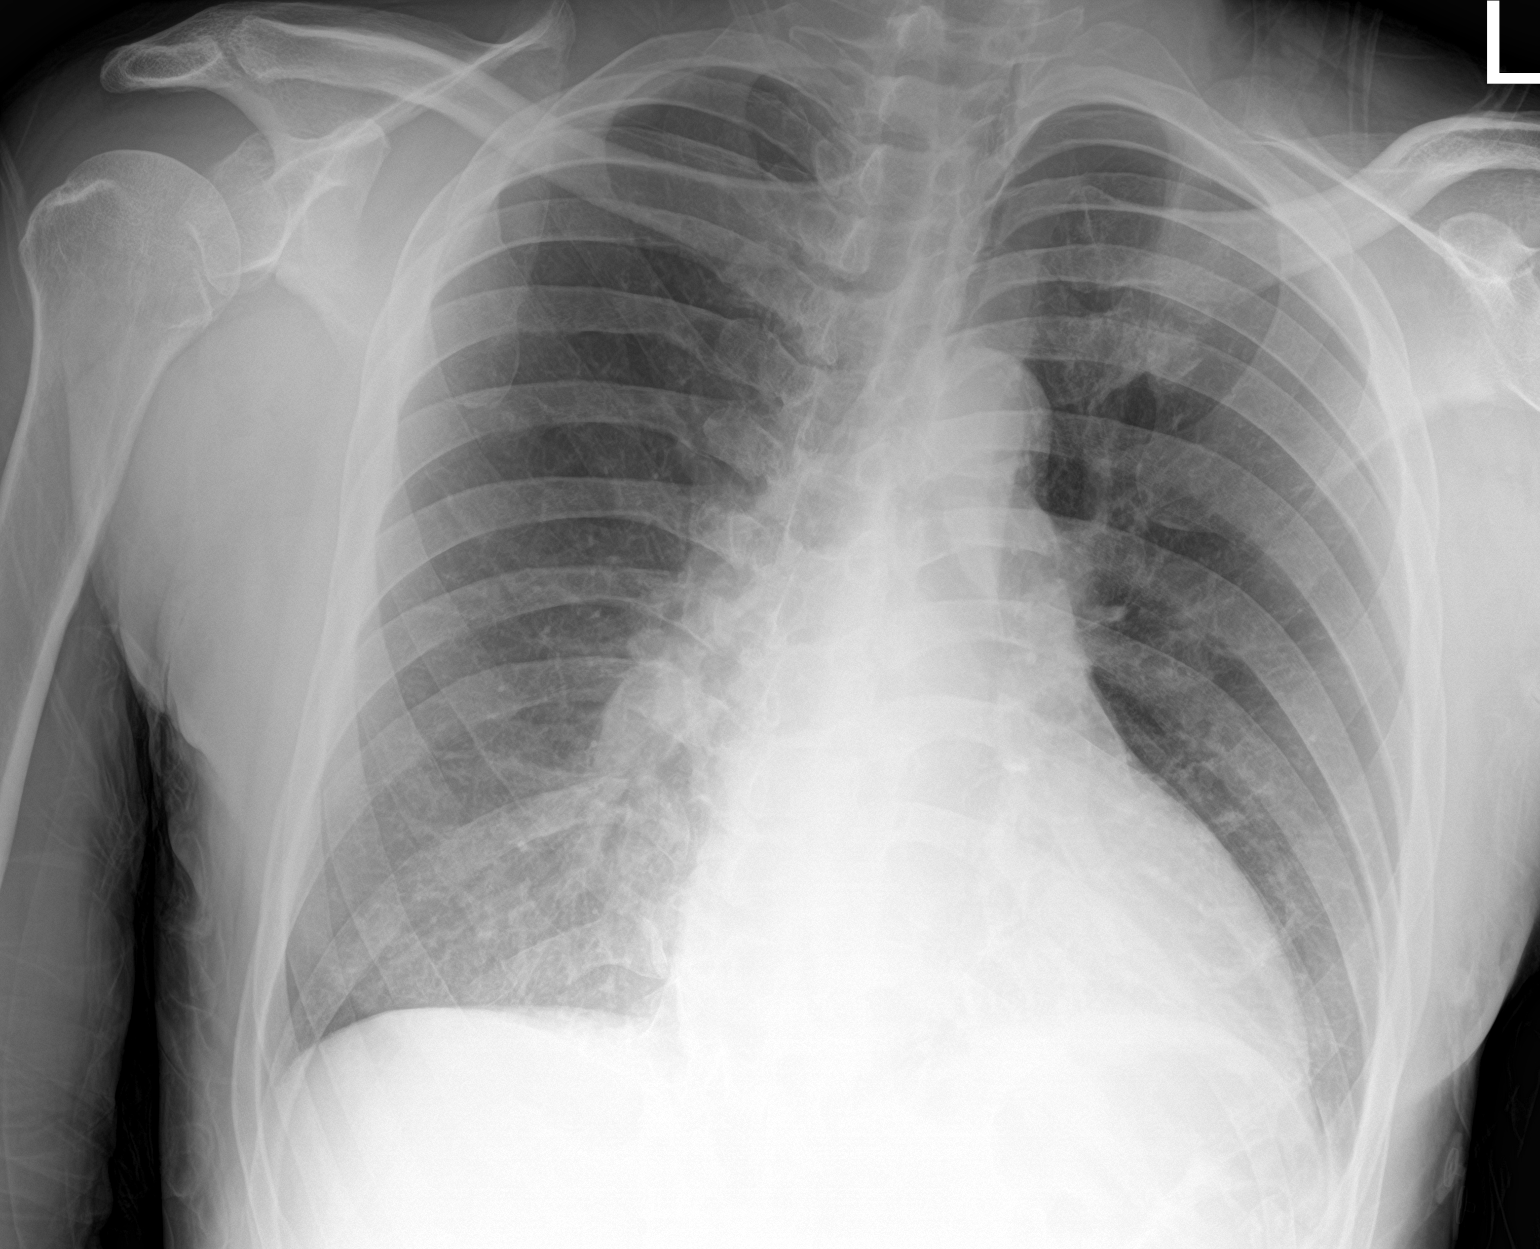

[2 of 2 positions shown; findings below may reference images not displayed]

FINDINGS: Cardiomediastinal silhouette and pulmonary vasculature are within
normal limits.

Lungs are clear.
IMPRESSION: No acute cardiopulmonary process.

## 2022-03-17 MED ORDER — HYDROMORPHONE HCL 1 MG/ML IJ SOLN
0.5000 mg | Freq: Once | INTRAMUSCULAR | Status: AC
  Administered 2022-03-17: 0.5 mg via SUBCUTANEOUS
  Filled 2022-03-17: qty 1

## 2022-03-17 MED ORDER — OXYCODONE-ACETAMINOPHEN 5-325 MG PO TABS
1.0000 | ORAL_TABLET | Freq: Once | ORAL | Status: AC
  Administered 2022-03-17: 1 via ORAL
  Filled 2022-03-17: qty 1

## 2022-03-17 NOTE — ED Provider Notes (Signed)
?[facility] EMERGENCY DEPARTMENT ?Provider Note ? ? ?CSN: [id number] ?Arrival date & time: 03/17/22  1122 ? ?  ? ?History ? ?Chief Complaint  ?Patient presents with  ? Foot Pain  ? Leg Pain  ? Shortness of Breath  ? ? ?[name] is a 56 y.o. male with medical history significant for asthma, bipolar 1 disorder, CHF, COPD, hypertension, schizoaffective disorder, GERD, diabetes, venous stasis ulcers.  Patient presents ED for evaluation of leg and foot pain.  When asked when this leg and foot pain began, the patient became irritated and yelled that "it is all in my chart".  Patient cannot tell me when the foot pain or leg pain began.  The patient continuously states that "whenever I come in no one ever does anything for me".  Patient is seen in this ED often for the same complaint of chronic lower leg pain.  Patient currently being seen by wound care as well as pain management.  The patient denies any deviation from his baseline leg pain.  The patient denies any recent injury or trauma.  The patient denies any lower leg swelling.  The patient denies any fevers, nausea or vomiting.  The patient denies any chest pain.  There was mention about this patient being short of breath in triage however on my interview he denies any shortness of breath.  The patient is requesting food on my interview. ? ? ?Foot Pain ?Pertinent negatives include no chest pain and no shortness of breath.  ?Leg Pain ?Associated symptoms: no fever   ?Shortness of Breath ?Associated symptoms: no chest pain and no fever   ? ?  ? ?Home Medications ?Prior to Admission medications   ?Medication Sig Start Date End Date Taking? Authorizing Provider  ?albuterol (VENTOLIN HFA) 108 (90 Base) MCG/ACT inhaler Inhale 2 puffs into the lungs every 6 (six) hours as needed for wheezing or shortness of breath. 03/07/22   Meredeth Ide, MD  ?atorvastatin (LIPITOR) 10 MG tablet Take 4 tablets (40 mg total) by mouth daily. 03/07/22   Meredeth Ide, MD   ?carvedilol (COREG) 3.125 MG tablet Take 1 tablet (3.125 mg total) by mouth 2 (two) times daily with a meal. 03/07/22   Meredeth Ide, MD  ?folic acid (FOLVITE) 1 MG tablet Take 1 tablet (1 mg total) by mouth daily. 03/08/22   Meredeth Ide, MD  ?furosemide (LASIX) 40 MG tablet Take 1 tablet (40 mg total) by mouth 2 (two) times daily. 03/07/22 03/07/23  Meredeth Ide, MD  ?gabapentin (NEURONTIN) 400 MG capsule Take 1 capsule (400 mg total) by mouth 3 (three) times daily. 03/07/22   Meredeth Ide, MD  ?hydrALAZINE (APRESOLINE) 25 MG tablet Take 1 tablet (25 mg total) by mouth 3 (three) times daily. 03/07/22 07/05/22  Meredeth Ide, MD  ?isosorbide mononitrate (IMDUR) 60 MG 24 hr tablet Take 1 tablet (60 mg total) by mouth daily. 03/07/22 07/05/22  Meredeth Ide, MD  ?levETIRAcetam (KEPPRA) 500 MG tablet Take 1 tablet (500 mg total) by mouth 2 (two) times daily. 03/07/22 04/06/22  Meredeth Ide, MD  ?OLANZapine (ZYPREXA) 5 MG tablet Take 1 tablet (5 mg total) by mouth daily. 03/07/22   Meredeth Ide, MD  ?oxyCODONE (OXY IR/ROXICODONE) 5 MG immediate release tablet Take 1-2 tablets (5-10 mg total) by mouth every 6 (six) hours as needed for severe pain or breakthrough pain. 03/07/22   Meredeth Ide, MD  ?spironolactone (ALDACTONE) 25 MG tablet Take 1/2  tablet (12.5 mg total) by mouth daily. 03/08/22   Meredeth Ide, MD  ?thiamine 100 MG tablet Take 1 tablet (100 mg total) by mouth daily. 03/08/22   Meredeth Ide, MD  ?tiotropium (SPIRIVA HANDIHALER) 18 MCG inhalation capsule Place 1 capsule (18 mcg total) into inhaler and inhale daily as directed. 03/07/22 06/05/22  Meredeth Ide, MD  ?valsartan (DIOVAN) 40 MG tablet Take 1/2 tablet (20 mg total) by mouth 2 (two) times daily. 03/07/22 03/07/23  Meredeth Ide, MD  ?   ? ?Allergies    ?Ibuprofen, Acetaminophen, Lyrica cr [pregabalin er], and Peanut-containing drug products   ? ?Review of Systems   ?Review of Systems  ?Constitutional:  Negative for chills and fever.  ?Respiratory:  Negative for shortness of  breath.   ?Cardiovascular:  Negative for chest pain and leg swelling.  ?Musculoskeletal:  Positive for arthralgias and myalgias. Negative for joint swelling.  ?All other systems reviewed and are negative. ? ?Physical Exam ?Updated Vital Signs ?BP (!) 137/104 (BP Location: Left Wrist)   Pulse (!) 102   Temp 97.8 ?F (36.6 ?C) (Oral)   Resp 16   SpO2 100%  ?Physical Exam ?Vitals and nursing note reviewed.  ?Constitutional:   ?   General: He is not in acute distress. ?   Appearance: He is well-developed. He is not ill-appearing, toxic-appearing or diaphoretic.  ?HENT:  ?   Head: Normocephalic and atraumatic.  ?   Mouth/Throat:  ?   Mouth: Mucous membranes are moist.  ?   Pharynx: Oropharynx is clear.  ?Eyes:  ?   Extraocular Movements: Extraocular movements intact.  ?   Pupils: Pupils are equal, round, and reactive to light.  ?Neck:  ?   Vascular: No JVD.  ?Cardiovascular:  ?   Rate and Rhythm: Normal rate and regular rhythm.  ?Pulmonary:  ?   Effort: Pulmonary effort is normal.  ?   Breath sounds: No wheezing, rhonchi or rales.  ?Abdominal:  ?   General: Abdomen is flat.  ?   Palpations: Abdomen is soft.  ?   Tenderness: There is no abdominal tenderness.  ?Musculoskeletal:  ?   Cervical back: Normal range of motion and neck supple.  ?   Right lower leg: No edema.  ?   Left lower leg: No edema.  ?Skin: ?   General: Skin is warm and dry.  ?   Capillary Refill: Capillary refill takes less than 2 seconds.  ?   Comments: Patient has bilateral great toe amputations.  Patient bilateral lower extremities wrapped in gauze.  Gauze bandages are dry clean and intact.  There is no seepage of fluid through material.  There is no foul smell coming from the gauze.  ?Neurological:  ?   Mental Status: He is alert and oriented to person, place, and time.  ? ? ?ED Results / Procedures / Treatments   ?Labs ?(all labs ordered are listed, but only abnormal results are displayed) ?Labs Reviewed  ?BASIC METABOLIC PANEL - Abnormal; Notable  for the following components:  ?    Result Value  ? Calcium 8.4 (*)   ? All other components within normal limits  ?CBC - Abnormal; Notable for the following components:  ? RBC 4.06 (*)   ? Hemoglobin 12.6 (*)   ? HCT 38.3 (*)   ? All other components within normal limits  ? ? ?EKG ?EKG Interpretation ? ?Date/Time:  Monday March 17 2022 11:35:59 EDT ?Ventricular Rate:  107 ?PR Interval:  162 ?QRS Duration: 84 ?QT Interval:  376 ?QTC Calculation: 501 ?R Axis:   -26 ?Text Interpretation: Sinus tachycardia Biatrial enlargement Left ventricular hypertrophy ( Sokolow-Lyon , Romhilt-Estes ) Nonspecific T wave abnormality Abnormal ECG When compared with ECG of 26-Feb-2022 13:50, PREVIOUS ECG IS PRESENT Confirmed by Ernie Avena ([id number]) on 03/17/2022 5:55:41 PM ? ?Radiology ?DG Chest 2 View ? ?Result Date: 03/17/2022 ?CLINICAL DATA:  CHF EXAM: CHEST - 2 VIEW COMPARISON:  Is 02/26/2022 FINDINGS: Cardiomediastinal silhouette and pulmonary vasculature are within normal limits. Lungs are clear. IMPRESSION: No acute cardiopulmonary process. Electronically Signed   By: Acquanetta Belling M.D.   On: 03/17/2022 12:46   ? ?Procedures ?Procedures  ? ? ?Medications Ordered in ED ?Medications  ?HYDROmorphone (DILAUDID) injection 0.5 mg (has no administration in time range)  ?oxyCODONE-acetaminophen (PERCOCET/ROXICET) 5-325 MG per tablet 1 tablet (1 tablet Oral Given 03/17/22 1144)  ?oxyCODONE-acetaminophen (PERCOCET/ROXICET) 5-325 MG per tablet 1 tablet (1 tablet Oral Given 03/17/22 1144)  ? ? ?ED Course/ Medical Decision Making/ A&P ?  ?                        ?Medical Decision Making ?Risk ?Prescription drug management. ? ? ?56 year old male presents ED for evaluation of lower leg pain.  Please see HPI for further details. ? ?On examination, the patient is afebrile, nontachycardic, nonhypoxic.  The patient lung sounds are clear bilaterally.  The patient's abdomen is soft and compressible in all 4 quadrants.  Patient is alert and oriented x4.   Patient continuously yelling, stating that "no one ever listens to me when I come in".  Patient manages placed by wound care inspected and are clean dry and intact. ? ?Patient worked up utilizing following i

## 2022-03-17 NOTE — Discharge Instructions (Signed)
Please return to the ED with any new or worsening symptoms ?Please follow-up with pain management tomorrow as previously scheduled ?Please follow-up with wound management on Wednesday, with Leonard Schwartz ?

## 2022-03-17 NOTE — ED Notes (Signed)
Pt refusing to be hooked up to the monitor states "I already have all of my tests done, I just want some ice cream and graham crackers". RN aware.  ?

## 2022-03-17 NOTE — ED Notes (Signed)
Pt requesting to be transferred back to his wheelchair stating "I wanna just go home, tell the nurse or doctor or whoever to call me an [name] to get me home". Pt educated that his care is not complete yet and that I would ask their nurse to come talk to them. Pt still requesting to leave.  ?

## 2022-03-17 NOTE — ED Provider Triage Note (Signed)
Emergency Medicine Provider Triage Evaluation Note ? ?Lejend Dalby , a 56 y.o. male  was evaluated in triage.  Pt complains of foot pain leg pain and shortness of breath ? ?Review of Systems  ?Positive: Shortness of breath  ?Negative: Fever  ? ?Physical Exam  ?BP (!) 141/106 (BP Location: Left Arm)   Pulse (!) 108   Temp 97.8 ?F (36.6 ?C) (Oral)   Resp 20   SpO2 99%  ?Gen:   Awake, no distress   ?Resp:  Normal effort  ?MSK:   Amputation toes, dressing  ?Other:   ? ?Medical Decision Making  ?Medically screening exam initiated at 11:36 AM.  Appropriate orders placed.  Peighton Edgin was informed that the remainder of the evaluation will be completed by another provider, this initial triage assessment does not replace that evaluation, and the importance of remaining in the ED until their evaluation is complete. ? ? ? ?  ?Elson Areas, [place] ?03/17/22 1137 ? ?

## 2022-03-17 NOTE — ED Notes (Signed)
Pt educated on being connected to cardiac monitor, pt refusing.  ?

## 2022-03-17 NOTE — Social Work (Signed)
CSW provided bus pass for d/c transport. ?Security will wheel Pt to bus stop. Pt verbalized appreciation.  ?

## 2022-03-17 NOTE — ED Triage Notes (Signed)
Pt. Stated, [name] had foot pain , leg pain for a couple of months its just getting worse. The pain is terrible. ?

## 2022-03-19 ENCOUNTER — Encounter (HOSPITAL_BASED_OUTPATIENT_CLINIC_OR_DEPARTMENT_OTHER): Payer: Medicaid Other | Admitting: Physician Assistant

## 2022-03-20 ENCOUNTER — Emergency Department (HOSPITAL_COMMUNITY): Payer: Medicaid Other

## 2022-03-20 ENCOUNTER — Emergency Department (HOSPITAL_COMMUNITY)
Admission: EM | Admit: 2022-03-20 | Discharge: 2022-03-21 | Disposition: A | Discharge status: Home / Self Care | Payer: Medicaid Other | Attending: Emergency Medicine | Admitting: Emergency Medicine

## 2022-03-20 ENCOUNTER — Encounter (HOSPITAL_COMMUNITY): Payer: Self-pay | Admitting: Emergency Medicine

## 2022-03-20 ENCOUNTER — Other Ambulatory Visit: Payer: Self-pay

## 2022-03-20 DIAGNOSIS — R0602 Shortness of breath: Secondary | ICD-10-CM | POA: Diagnosis not present

## 2022-03-20 DIAGNOSIS — J45909 Unspecified asthma, uncomplicated: Secondary | ICD-10-CM | POA: Diagnosis not present

## 2022-03-20 DIAGNOSIS — J449 Chronic obstructive pulmonary disease, unspecified: Secondary | ICD-10-CM | POA: Diagnosis not present

## 2022-03-20 DIAGNOSIS — Z91010 Allergy to peanuts: Secondary | ICD-10-CM | POA: Insufficient documentation

## 2022-03-20 DIAGNOSIS — S81802A Unspecified open wound, left lower leg, initial encounter: Secondary | ICD-10-CM | POA: Diagnosis not present

## 2022-03-20 DIAGNOSIS — I509 Heart failure, unspecified: Secondary | ICD-10-CM | POA: Diagnosis not present

## 2022-03-20 DIAGNOSIS — Z79899 Other long term (current) drug therapy: Secondary | ICD-10-CM | POA: Insufficient documentation

## 2022-03-20 DIAGNOSIS — S81801A Unspecified open wound, right lower leg, initial encounter: Secondary | ICD-10-CM | POA: Insufficient documentation

## 2022-03-20 DIAGNOSIS — X58XXXA Exposure to other specified factors, initial encounter: Secondary | ICD-10-CM | POA: Diagnosis not present

## 2022-03-20 DIAGNOSIS — E119 Type 2 diabetes mellitus without complications: Secondary | ICD-10-CM | POA: Diagnosis not present

## 2022-03-20 DIAGNOSIS — J441 Chronic obstructive pulmonary disease with (acute) exacerbation: Secondary | ICD-10-CM

## 2022-03-20 DIAGNOSIS — L089 Local infection of the skin and subcutaneous tissue, unspecified: Secondary | ICD-10-CM | POA: Diagnosis not present

## 2022-03-20 DIAGNOSIS — I110 Hypertensive heart disease with heart failure: Secondary | ICD-10-CM | POA: Insufficient documentation

## 2022-03-20 LAB — URINALYSIS, ROUTINE W REFLEX MICROSCOPIC
Bilirubin Urine: NEGATIVE
Glucose, UA: NEGATIVE mg/dL
Hgb urine dipstick: NEGATIVE
Ketones, ur: 5 mg/dL — AB
Leukocytes,Ua: NEGATIVE
Nitrite: NEGATIVE
Protein, ur: 100 mg/dL — AB
Specific Gravity, Urine: 1.032 — ABNORMAL HIGH (ref 1.005–1.030)
pH: 5 (ref 5.0–8.0)

## 2022-03-20 LAB — CBC WITH DIFFERENTIAL/PLATELET
Abs Immature Granulocytes: 0.02 10*3/uL (ref 0.00–0.07)
Basophils Absolute: 0.1 10*3/uL (ref 0.0–0.1)
Basophils Relative: 1 %
Eosinophils Absolute: 0 10*3/uL (ref 0.0–0.5)
Eosinophils Relative: 1 %
HCT: 36.1 % — ABNORMAL LOW (ref 39.0–52.0)
Hemoglobin: 12.4 g/dL — ABNORMAL LOW (ref 13.0–17.0)
Immature Granulocytes: 0 %
Lymphocytes Relative: 29 %
Lymphs Abs: 2.2 10*3/uL (ref 0.7–4.0)
MCH: 31.9 pg (ref 26.0–34.0)
MCHC: 34.3 g/dL (ref 30.0–36.0)
MCV: 92.8 fL (ref 80.0–100.0)
Monocytes Absolute: 1.1 10*3/uL — ABNORMAL HIGH (ref 0.1–1.0)
Monocytes Relative: 14 %
Neutro Abs: 4.3 10*3/uL (ref 1.7–7.7)
Neutrophils Relative %: 55 %
Platelets: 404 10*3/uL — ABNORMAL HIGH (ref 150–400)
RBC: 3.89 MIL/uL — ABNORMAL LOW (ref 4.22–5.81)
RDW: 14 % (ref 11.5–15.5)
WBC: 7.7 10*3/uL (ref 4.0–10.5)
nRBC: 0 % (ref 0.0–0.2)

## 2022-03-20 LAB — COMPREHENSIVE METABOLIC PANEL
ALT: 20 U/L (ref 0–44)
AST: 20 U/L (ref 15–41)
Albumin: 2.6 g/dL — ABNORMAL LOW (ref 3.5–5.0)
Alkaline Phosphatase: 92 U/L (ref 38–126)
Anion gap: 10 (ref 5–15)
BUN: 13 mg/dL (ref 6–20)
CO2: 21 mmol/L — ABNORMAL LOW (ref 22–32)
Calcium: 8.3 mg/dL — ABNORMAL LOW (ref 8.9–10.3)
Chloride: 103 mmol/L (ref 98–111)
Creatinine, Ser: 1.15 mg/dL (ref 0.61–1.24)
GFR, Estimated: >60 mL/min (ref >60)
Glucose, Bld: 93 mg/dL (ref 70–99)
Potassium: 4.4 mmol/L (ref 3.5–5.1)
Sodium: 134 mmol/L — ABNORMAL LOW (ref 135–145)
Total Bilirubin: 1.3 mg/dL — ABNORMAL HIGH (ref 0.3–1.2)
Total Protein: 6.2 g/dL — ABNORMAL LOW (ref 6.5–8.1)

## 2022-03-20 LAB — LACTIC ACID, PLASMA: Lactic Acid, Venous: 1.4 mmol/L (ref 0.5–1.9)

## 2022-03-20 LAB — BRAIN NATRIURETIC PEPTIDE: B Natriuretic Peptide: 1636.2 pg/mL — ABNORMAL HIGH (ref 0.0–100.0)

## 2022-03-20 LAB — PROTIME-INR
INR: 1.2 (ref 0.8–1.2)
Prothrombin Time: 15.2 seconds (ref 11.4–15.2)

## 2022-03-20 LAB — TROPONIN I (HIGH SENSITIVITY): Troponin I (High Sensitivity): 41 ng/L — ABNORMAL HIGH (ref <18)

## 2022-03-20 IMAGING — DX DG CHEST 1V PORT
1 series · 1 of 1 positions shown · non-contrast
Comparison: [DATE] [DATE] p.m.

CLINICAL DATA: Shortness of breath

EXAM:
PORTABLE CHEST 1 VIEW

[chest ap]
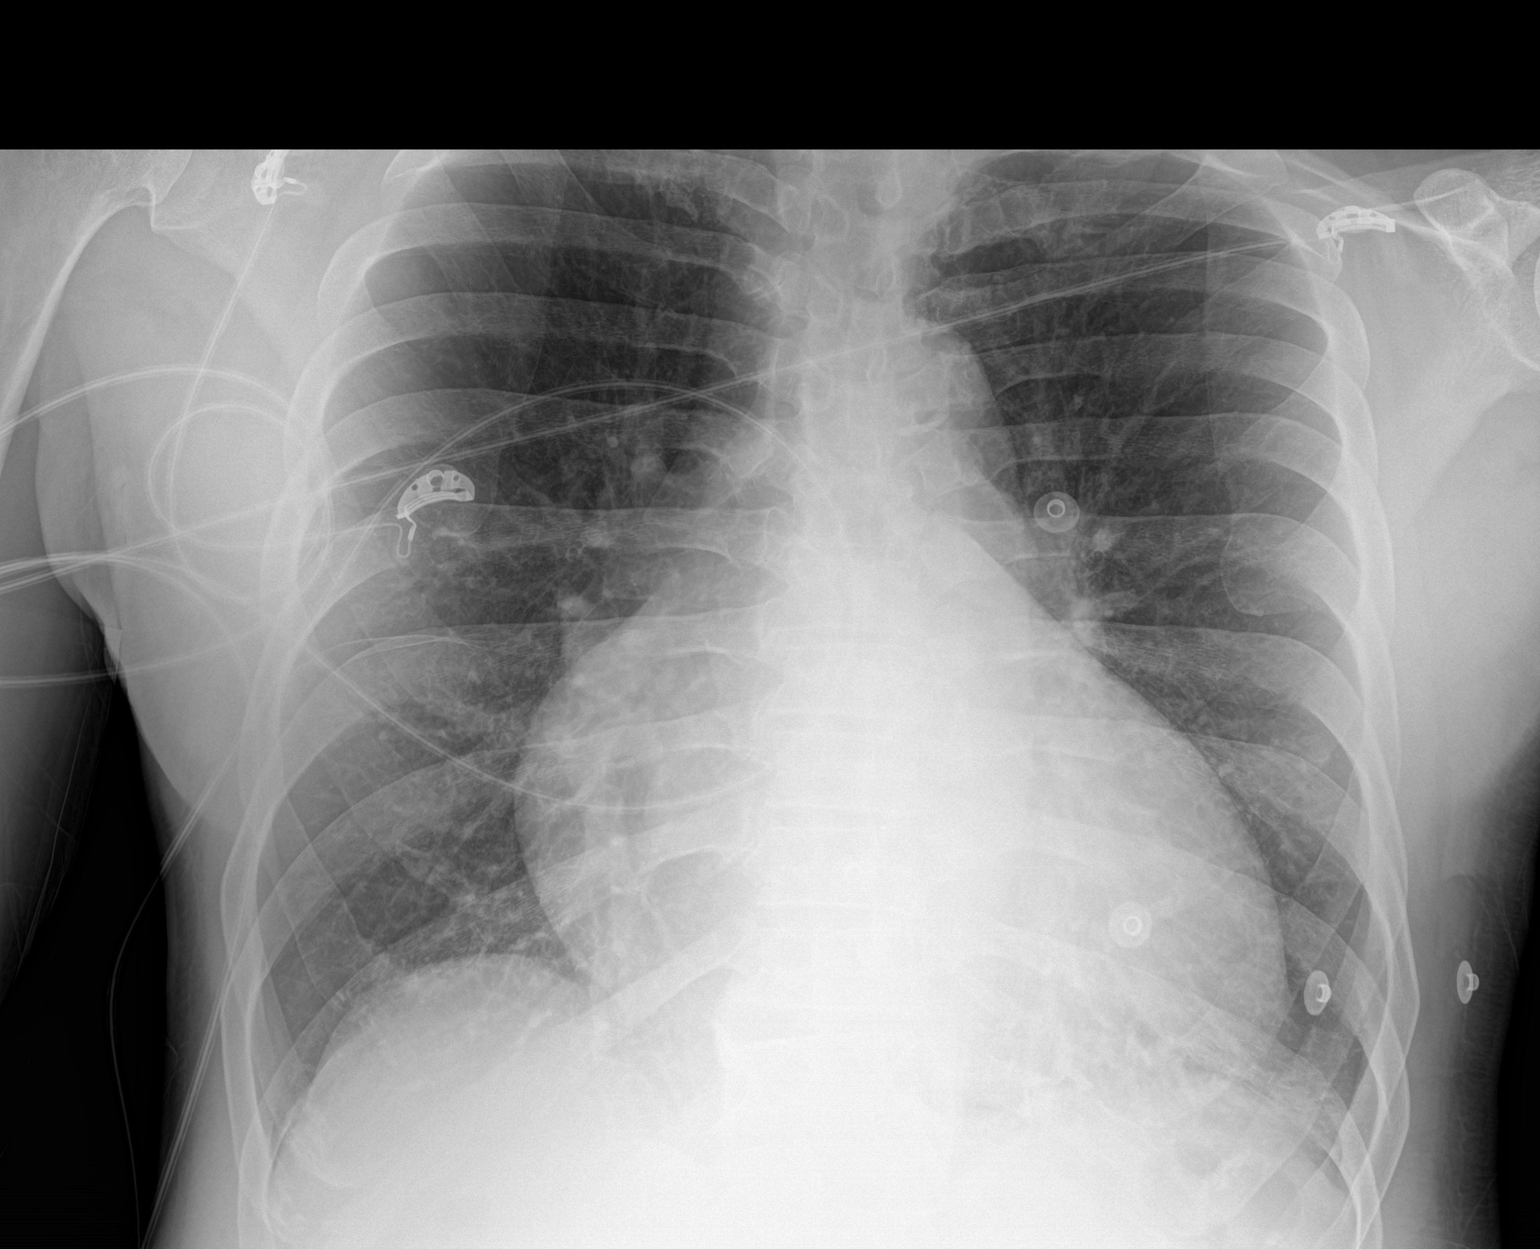

[1 of 1 positions shown; findings below may reference images not displayed]

FINDINGS: Cardiomegaly. No focal pulmonary opacity. No pleural effusion or
pneumothorax. No acute osseous abnormality.
IMPRESSION: Cardiomegaly.

## 2022-03-20 IMAGING — CR DG CHEST 2V
3 series · 3 of 3 positions shown · non-contrast
Comparison: [DATE]

CLINICAL DATA: Suspected sepsis

EXAM:
CHEST - 2 VIEW

[chest ap (1 of 2)]
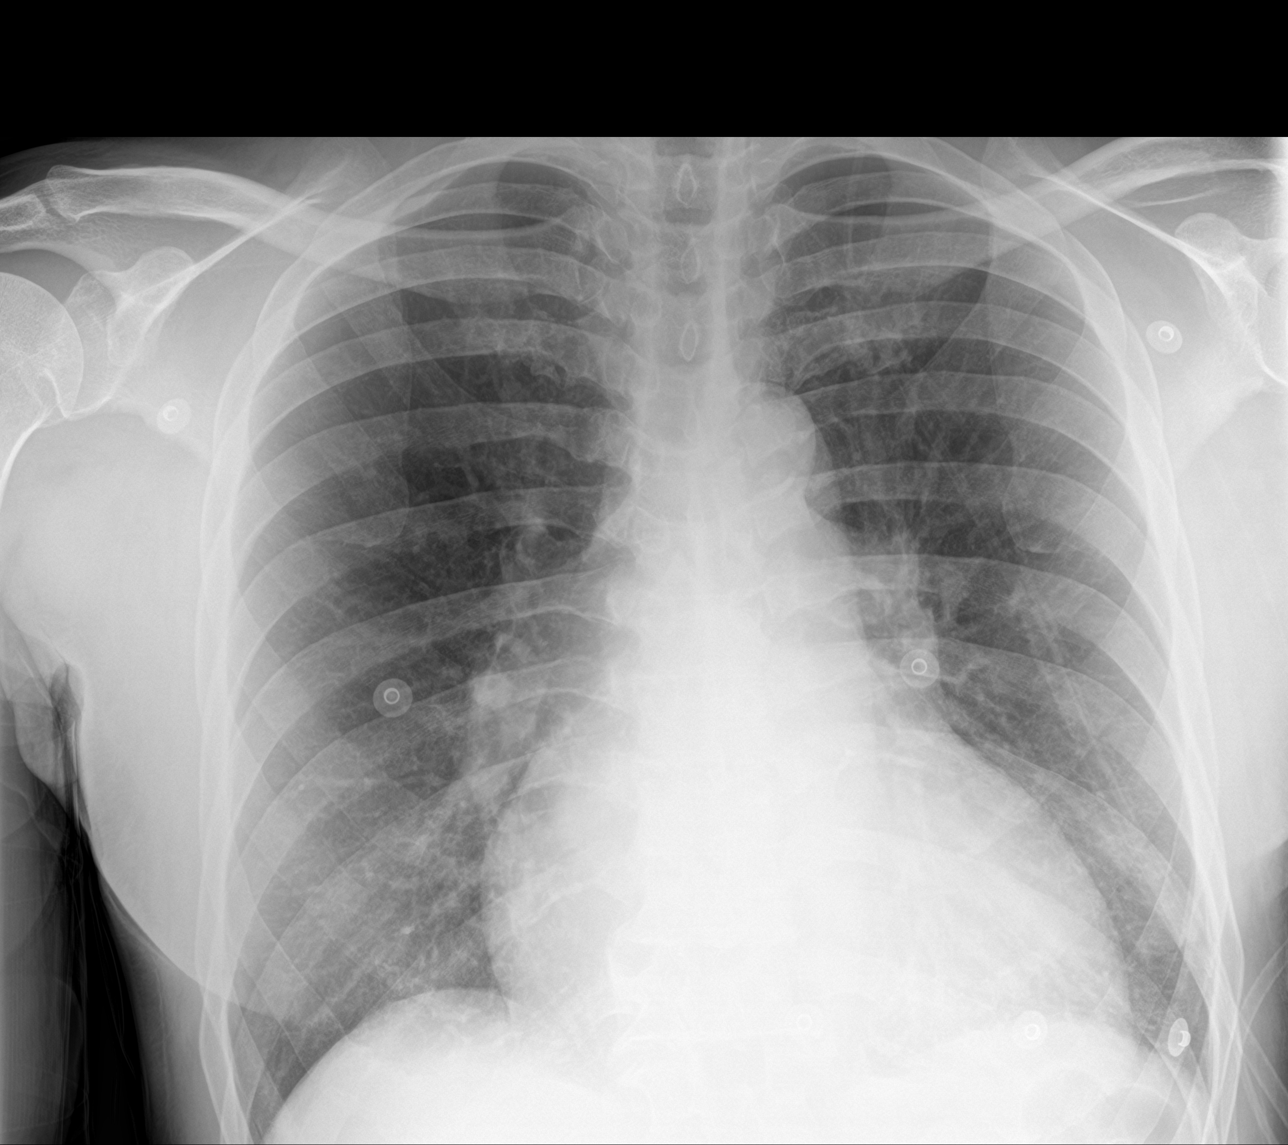

[chest lat]
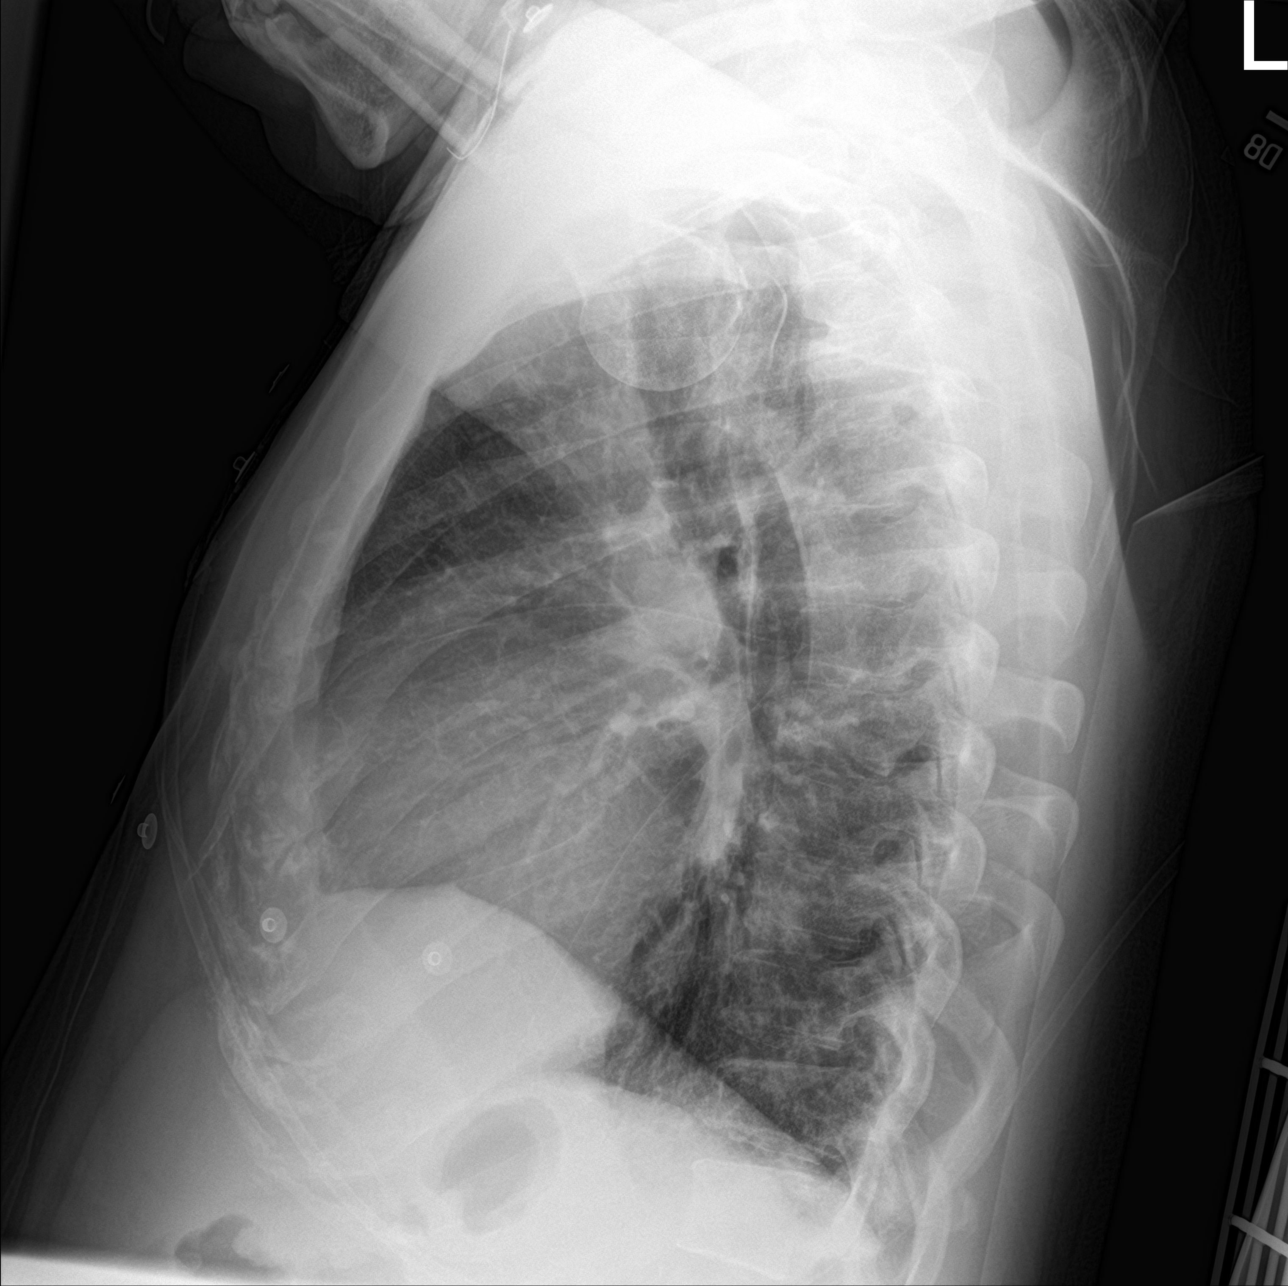

[chest ap (2 of 2)]
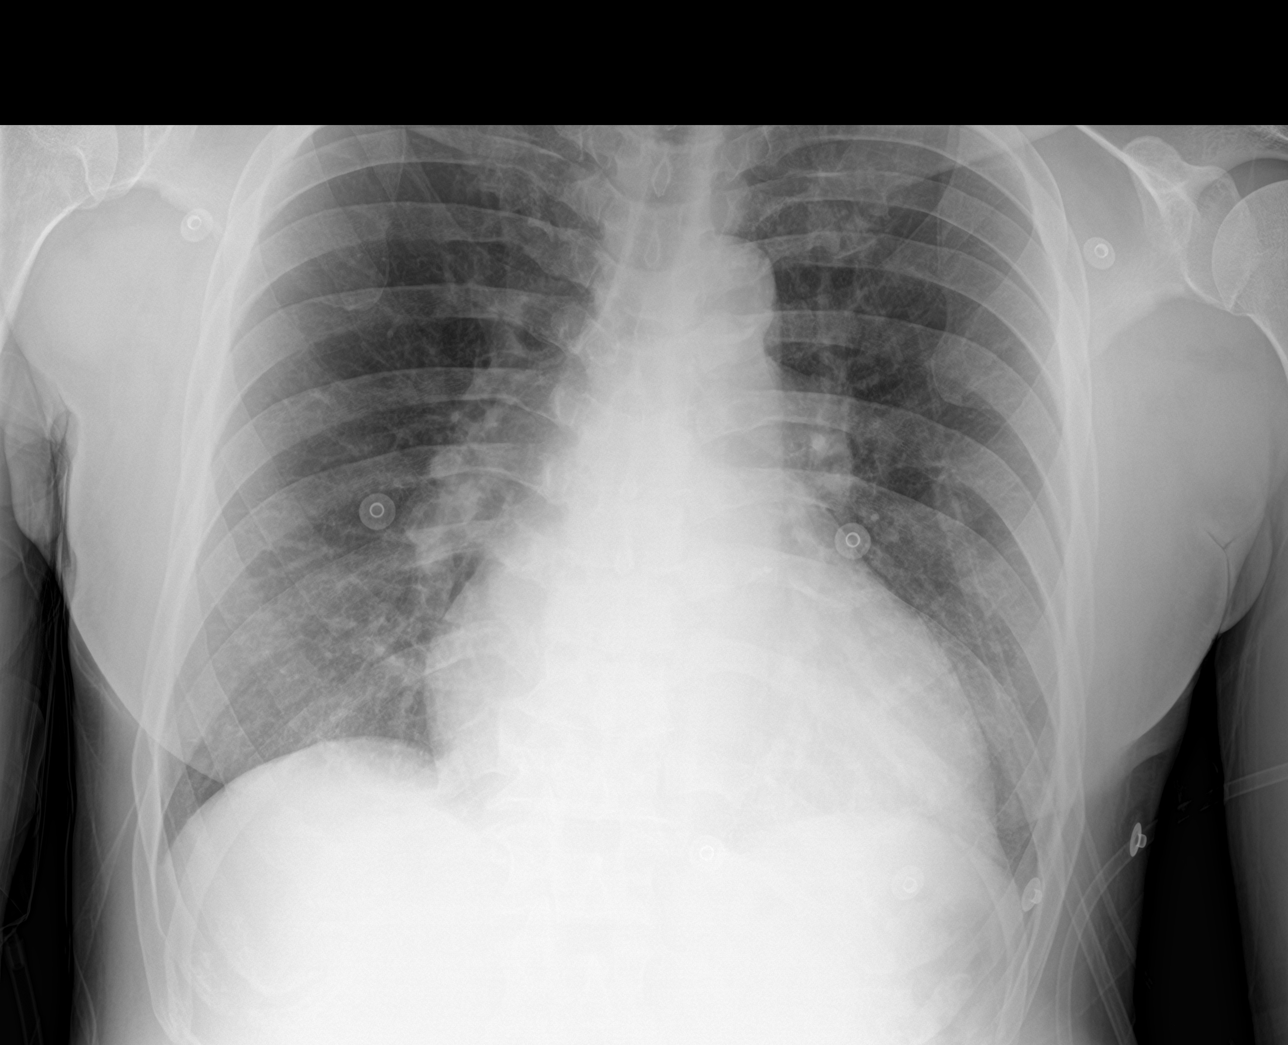

[3 of 3 positions shown; findings below may reference images not displayed]

FINDINGS: No new consolidation or edema. No pleural effusion or pneumothorax.
Mild cardiomegaly. No acute osseous abnormality.
IMPRESSION: No acute process in the chest.  Mild cardiomegaly.

## 2022-03-20 MED ORDER — IPRATROPIUM-ALBUTEROL 0.5-2.5 (3) MG/3ML IN SOLN
3.0000 mL | Freq: Once | RESPIRATORY_TRACT | Status: AC
  Administered 2022-03-20: 3 mL via RESPIRATORY_TRACT
  Filled 2022-03-20: qty 3

## 2022-03-20 MED ORDER — FENTANYL CITRATE PF 50 MCG/ML IJ SOSY
25.0000 ug | PREFILLED_SYRINGE | Freq: Once | INTRAMUSCULAR | Status: AC
  Administered 2022-03-20: 25 ug via INTRAVENOUS
  Filled 2022-03-20: qty 1

## 2022-03-20 MED ORDER — METHYLPREDNISOLONE SODIUM SUCC 125 MG IJ SOLR
125.0000 mg | Freq: Once | INTRAMUSCULAR | Status: AC
  Administered 2022-03-20: 125 mg via INTRAVENOUS
  Filled 2022-03-20: qty 2

## 2022-03-20 MED ORDER — MORPHINE SULFATE (PF) 4 MG/ML IV SOLN
4.0000 mg | Freq: Once | INTRAVENOUS | Status: AC
  Administered 2022-03-20: 4 mg via INTRAVENOUS
  Filled 2022-03-20: qty 1

## 2022-03-20 NOTE — ED Notes (Signed)
Patient provided with food and drink at this time. Patient has call bell in reach and verbalized update on the plan of care. Patient provided with a pillow under his injured leg  ?

## 2022-03-20 NOTE — ED Notes (Signed)
Help get patiet into a gown on the monitor patient is resting with call bell in reach  ?

## 2022-03-20 NOTE — ED Provider Notes (Signed)
Transfer of Care Note ?I assumed care of [name] on 03/20/2022 at 1530 ? ?Briefly, [name] is a 56 y.o. male who: ?-  Hx of CHF, sob X3 DAYS W cp  ?- Worsening Leg pain, seen by wound team, no acute things going on, chronic plan to f/u with wound care ?- Getting ACS workup  ? ?  ?DG Chest Portable 1 View  ?Final Result  ?  ?DG Chest 2 View  ?Final Result  ?  ? ?The plan includes: ?- f/u with ACS  ? ? ?Please refer to the original provider?s note for additional information regarding the care of [name]. ? ?### ?Reassessment: ?I personally reassessed the patient: ? ?On my exam, patient with mild expiratory wheezing bilaterally.  Otherwise physical exam grossly unchanged from the previous provider.  ? ?Additional MDM: ?-Patient CBC without leukocytosis.  His hemoglobin hematocrit are baseline.  His CMP without severe metabolic derangement.  His BNP elevated at 1600.  Patient does have elevated BNP at baseline whenever he visits the emergency department.  His troponin is downtrending from 3 weeks ago which was in the 60s.  He is currently in the 40s.  Urinalysis does not show infectious etiology.  Chest x-ray shows cardiomegaly but no significant pleural effusion. ? ?Given his wheezing and history of COPD, I have given him DuoNeb, and Solu-Medrol for mild COPD exacerbation. ? ?On reassessment, he was comfortably sleeping and oxygenating well above 95% on room air.  Upon entering the room, patient continued to move back and forth and states that he cannot breathe.  I was able to talk to him and he is significantly improved without any further intervention.  Patient lung sounds are significantly improved with very minimal wheezing.  He continues to oxygenate well above 95% on room air.  I have discussed his findings and informed him to follow-up with his provider as an outpatient.  I believe patient is stable and does not require any further intervention.  Strict return precaution has been  discussed. ? ?Dispo: Discharged ? ?  ?Jari Sportsman, MD ?03/21/22 0044 ? ?  ?Rozelle Logan, DO ?03/21/22 2352 ? ?

## 2022-03-20 NOTE — ED Provider Notes (Addendum)
?[facility] EMERGENCY DEPARTMENT ?Provider Note ? ? ?CSN: [id number] ?Arrival date & time: 03/20/22  1250 ? ?  ? ?History ? ?Chief Complaint  ?Patient presents with  ?? Wound Infection  ? ? ?[name] is a 56 y.o. male with past medical history of CHF (EF less than 20% 01/2022), hypertension, COPD, asthma, schizoaffective disorder, diabetes mellitus, homelessness.  Presents emergency department with a chief complaint of shortness of breath and worsening wounds to bilateral lower extremities.   ? ?Patient states that shortness of breath has been present over the last 3 to 4 days.  Shortness of breath has been constant over this time.  Patient denies any aggravating factors.  Patient reports he is not compliant with medications.  Patient endorses snorting cocaine as well as smoking cigarettes.  Endorses occasional alcohol use.  Patient is he has been having chest pain over the last 3 to 4 days as well.  Pain is located to his left chest.  Describes chest pain as a pressure.  Denies any radiation of pain.  Patient denies any associated nausea, vomiting, palpitations, lightheadedness, or syncope. ? ?Patient reports that wounds to bilateral lower extremities have gotten progressively worse.  Patient is unsure when this started getting worse as they are constantly wrapped.  Patient reports that he has had worsening pain to bilateral ankles over the last few weeks.  Patient reports that he last saw wound clinic last week.  Patient denies any associated numbness, weakness, fever, chills, purulent discharge, falls or traumatic injuries. ? ? ? ? ? ?HPI ? ?  ? ?Home Medications ?Prior to Admission medications   ?Medication Sig Start Date End Date Taking? Authorizing Provider  ?albuterol (VENTOLIN HFA) 108 (90 Base) MCG/ACT inhaler Inhale 2 puffs into the lungs every 6 (six) hours as needed for wheezing or shortness of breath. 03/07/22   Meredeth Ide, MD  ?atorvastatin (LIPITOR) 10 MG tablet Take 4 tablets  (40 mg total) by mouth daily. 03/07/22   Meredeth Ide, MD  ?carvedilol (COREG) 3.125 MG tablet Take 1 tablet (3.125 mg total) by mouth 2 (two) times daily with a meal. 03/07/22   Meredeth Ide, MD  ?folic acid (FOLVITE) 1 MG tablet Take 1 tablet (1 mg total) by mouth daily. 03/08/22   Meredeth Ide, MD  ?furosemide (LASIX) 40 MG tablet Take 1 tablet (40 mg total) by mouth 2 (two) times daily. 03/07/22 03/07/23  Meredeth Ide, MD  ?gabapentin (NEURONTIN) 400 MG capsule Take 1 capsule (400 mg total) by mouth 3 (three) times daily. 03/07/22   Meredeth Ide, MD  ?hydrALAZINE (APRESOLINE) 25 MG tablet Take 1 tablet (25 mg total) by mouth 3 (three) times daily. 03/07/22 07/05/22  Meredeth Ide, MD  ?isosorbide mononitrate (IMDUR) 60 MG 24 hr tablet Take 1 tablet (60 mg total) by mouth daily. 03/07/22 07/05/22  Meredeth Ide, MD  ?levETIRAcetam (KEPPRA) 500 MG tablet Take 1 tablet (500 mg total) by mouth 2 (two) times daily. 03/07/22 04/06/22  Meredeth Ide, MD  ?OLANZapine (ZYPREXA) 5 MG tablet Take 1 tablet (5 mg total) by mouth daily. 03/07/22   Meredeth Ide, MD  ?oxyCODONE (OXY IR/ROXICODONE) 5 MG immediate release tablet Take 1-2 tablets (5-10 mg total) by mouth every 6 (six) hours as needed for severe pain or breakthrough pain. 03/07/22   Meredeth Ide, MD  ?spironolactone (ALDACTONE) 25 MG tablet Take 1/2 tablet (12.5 mg total) by mouth daily. 03/08/22   Mauro Kaufmann  S, MD  ?thiamine 100 MG tablet Take 1 tablet (100 mg total) by mouth daily. 03/08/22   Meredeth Ide, MD  ?tiotropium (SPIRIVA HANDIHALER) 18 MCG inhalation capsule Place 1 capsule (18 mcg total) into inhaler and inhale daily as directed. 03/07/22 06/05/22  Meredeth Ide, MD  ?valsartan (DIOVAN) 40 MG tablet Take 1/2 tablet (20 mg total) by mouth 2 (two) times daily. 03/07/22 03/07/23  Meredeth Ide, MD  ?   ? ?Allergies    ?Ibuprofen, Acetaminophen, Lyrica cr [pregabalin er], and Peanut-containing drug products   ? ?Review of Systems   ?Review of Systems  ?Constitutional:  Negative for  chills and fever.  ?Eyes:  Negative for visual disturbance.  ?Respiratory:  Positive for shortness of breath. Negative for cough.   ?Cardiovascular:  Positive for chest pain and leg swelling.  ?Gastrointestinal:  Negative for abdominal pain, nausea and vomiting.  ?Musculoskeletal:  Positive for arthralgias. Negative for back pain and neck pain.  ?Skin:  Positive for wound. Negative for color change, pallor and rash.  ?Neurological:  Negative for dizziness, syncope, light-headedness and headaches.  ?Psychiatric/Behavioral:  Negative for confusion.   ? ?Physical Exam ?Updated Vital Signs ?BP (!) 146/114   Pulse (!) 101   Temp 97.9 ?F (36.6 ?C) (Oral)   Resp (!) 40   Ht 5\' 8"  (1.727 m)   Wt 94.1 kg   SpO2 100%   BMI 31.54 kg/m?  ?Physical Exam ?Vitals and nursing note reviewed.  ?Constitutional:   ?   General: He is not in acute distress. ?   Appearance: He is not ill-appearing, toxic-appearing or diaphoretic.  ?HENT:  ?   Head: Normocephalic.  ?Eyes:  ?   General: No scleral icterus.    ?   Right eye: No discharge.     ?   Left eye: No discharge.  ?Cardiovascular:  ?   Rate and Rhythm: Normal rate.  ?   Pulses:     ?     Radial pulses are 2+ on the right side and 2+ on the left side.  ?     Dorsalis pedis pulses are 2+ on the right side and 2+ on the left side.  ?Pulmonary:  ?   Effort: Pulmonary effort is normal. Tachypnea present. No respiratory distress.  ?   Breath sounds: Normal breath sounds. No stridor.  ?Musculoskeletal:  ?   Right lower leg: 2+ Edema present.  ?   Left lower leg: 2+ Edema present.  ?   Comments: Patient has what appears to be chronic wounds to both medial and lateral aspects of right foot and ankle; no acute wounds noted to right foot or ankle.  No associated purulent discharge.  Status post bilateral great toe amputation.  +2 edema noted to bilateral lower extremities, no erythema, warmth, or rash.  ?Skin: ?   General: Skin is warm and dry.  ?Neurological:  ?   General: No focal  deficit present.  ?   Mental Status: He is alert.  ?Psychiatric:     ?   Behavior: Behavior is cooperative.  ? ? ? ? ? ? ? ? ? ? ?ED Results / Procedures / Treatments   ?Labs ?(all labs ordered are listed, but only abnormal results are displayed) ?Labs Reviewed  ?COMPREHENSIVE METABOLIC PANEL - Abnormal; Notable for the following components:  ?    Result Value  ? Sodium 134 (*)   ? CO2 21 (*)   ? Calcium 8.3 (*)   ?  Total Protein 6.2 (*)   ? Albumin 2.6 (*)   ? Total Bilirubin 1.3 (*)   ? All other components within normal limits  ?CBC WITH DIFFERENTIAL/PLATELET - Abnormal; Notable for the following components:  ? RBC 3.89 (*)   ? Hemoglobin 12.4 (*)   ? HCT 36.1 (*)   ? Platelets 404 (*)   ? Monocytes Absolute 1.1 (*)   ? All other components within normal limits  ?CULTURE, BLOOD (ROUTINE X 2)  ?CULTURE, BLOOD (ROUTINE X 2)  ?LACTIC ACID, PLASMA  ?PROTIME-INR  ?LACTIC ACID, PLASMA  ?URINALYSIS, ROUTINE W REFLEX MICROSCOPIC  ?BRAIN NATRIURETIC PEPTIDE  ?TROPONIN I (HIGH SENSITIVITY)  ?TROPONIN I (HIGH SENSITIVITY)  ? ? ?EKG ?None ? ?Radiology ?DG Chest 2 View ? ?Result Date: 03/20/2022 ?CLINICAL DATA:  Suspected sepsis EXAM: CHEST - 2 VIEW COMPARISON:  03/17/2022 FINDINGS: No new consolidation or edema. No pleural effusion or pneumothorax. Mild cardiomegaly. No acute osseous abnormality. IMPRESSION: No acute process in the chest.  Mild cardiomegaly. Electronically Signed   By: Guadlupe Spanish M.D.   On: 03/20/2022 13:30   ? ?Procedures ?Procedures  ? ? ?Medications Ordered in ED ?Medications  ?morphine (PF) 4 MG/ML injection 4 mg (has no administration in time range)  ? ? ?ED Course/ Medical Decision Making/ A&P ?  ?                        ?Medical Decision Making ?Amount and/or Complexity of Data Reviewed ?Labs: ordered. ?Radiology: ordered. ? ?Risk ?Prescription drug management. ? ? ?Alert 56 year old male in no distress, nontoxic-appearing.  Presents emergency department the chief plaint of shortness of breath,  chest pain, and concern for wound infection. ? ?Information obtained from patient.  Past medical records were reviewed including previous provider notes, labs, and imaging.  Patient's medical history as outlined HPI was

## 2022-03-20 NOTE — ED Triage Notes (Incomplete)
Patient BIB [facility] from [facility] for evaluation of bilateral weeping wounds on his feet. RR 40 in triage and patient feels warm to touch, is alert and oriented at this time. ?

## 2022-03-20 NOTE — ED Notes (Signed)
Delay in Troponin and BNP - Labs were drawn by Tobi Bastos, paramedic at [phone]. This RN noticed the initial troponin had not resulted. This RN called around 1730 to check on that and was told it would be added on. At 1900 this RN noticed it still had not been added on. This RN called lab back to check on this and was told that the blood was collected at 1315 and they can not add on blood more than 4 hours. The blood was drawn at 1547 but the Lab states it was collected him at 1315 but that is not correct. The plan is to redraw a first troponin and BNP. Dr. Ferdinand Cava informed. Labs redrawn by this RN and sent to the lab. ?

## 2022-03-21 LAB — TROPONIN I (HIGH SENSITIVITY): Troponin I (High Sensitivity): 43 ng/L — ABNORMAL HIGH (ref <18)

## 2022-03-21 NOTE — ED Notes (Signed)
Fentanyl 0.41ml/25mcg wasted and witnessed by Gevena Cotton, RN ?

## 2022-03-23 ENCOUNTER — Emergency Department (HOSPITAL_COMMUNITY): Payer: Medicaid Other

## 2022-03-23 ENCOUNTER — Other Ambulatory Visit: Payer: Self-pay

## 2022-03-23 ENCOUNTER — Inpatient Hospital Stay (HOSPITAL_COMMUNITY): Payer: Medicaid Other

## 2022-03-23 ENCOUNTER — Inpatient Hospital Stay (HOSPITAL_COMMUNITY)
Admission: EM | Admit: 2022-03-23 | Discharge: 2022-03-31 | DRG: 291 | Disposition: A | Discharge status: Home / Self Care | Payer: Medicaid Other | Attending: Family Medicine | Admitting: Family Medicine

## 2022-03-23 ENCOUNTER — Encounter (HOSPITAL_COMMUNITY): Payer: Self-pay

## 2022-03-23 DIAGNOSIS — Z89422 Acquired absence of other left toe(s): Secondary | ICD-10-CM

## 2022-03-23 DIAGNOSIS — R6510 Systemic inflammatory response syndrome (SIRS) of non-infectious origin without acute organ dysfunction: Secondary | ICD-10-CM

## 2022-03-23 DIAGNOSIS — M7989 Other specified soft tissue disorders: Secondary | ICD-10-CM | POA: Diagnosis present

## 2022-03-23 DIAGNOSIS — L0390 Cellulitis, unspecified: Secondary | ICD-10-CM | POA: Diagnosis not present

## 2022-03-23 DIAGNOSIS — I5040 Unspecified combined systolic (congestive) and diastolic (congestive) heart failure: Secondary | ICD-10-CM | POA: Diagnosis not present

## 2022-03-23 DIAGNOSIS — R471 Dysarthria and anarthria: Secondary | ICD-10-CM | POA: Diagnosis present

## 2022-03-23 DIAGNOSIS — R778 Other specified abnormalities of plasma proteins: Secondary | ICD-10-CM | POA: Diagnosis present

## 2022-03-23 DIAGNOSIS — I5043 Acute on chronic combined systolic (congestive) and diastolic (congestive) heart failure: Secondary | ICD-10-CM | POA: Diagnosis present

## 2022-03-23 DIAGNOSIS — J441 Chronic obstructive pulmonary disease with (acute) exacerbation: Secondary | ICD-10-CM | POA: Diagnosis present

## 2022-03-23 DIAGNOSIS — Z79899 Other long term (current) drug therapy: Secondary | ICD-10-CM

## 2022-03-23 DIAGNOSIS — Z8673 Personal history of transient ischemic attack (TIA), and cerebral infarction without residual deficits: Secondary | ICD-10-CM | POA: Diagnosis not present

## 2022-03-23 DIAGNOSIS — S81801A Unspecified open wound, right lower leg, initial encounter: Secondary | ICD-10-CM | POA: Diagnosis not present

## 2022-03-23 DIAGNOSIS — I5023 Acute on chronic systolic (congestive) heart failure: Secondary | ICD-10-CM | POA: Diagnosis not present

## 2022-03-23 DIAGNOSIS — I429 Cardiomyopathy, unspecified: Secondary | ICD-10-CM | POA: Diagnosis present

## 2022-03-23 DIAGNOSIS — I2510 Atherosclerotic heart disease of native coronary artery without angina pectoris: Secondary | ICD-10-CM | POA: Diagnosis present

## 2022-03-23 DIAGNOSIS — I110 Hypertensive heart disease with heart failure: Principal | ICD-10-CM | POA: Diagnosis present

## 2022-03-23 DIAGNOSIS — Z91148 Patient's other noncompliance with medication regimen for other reason: Secondary | ICD-10-CM

## 2022-03-23 DIAGNOSIS — Z89421 Acquired absence of other right toe(s): Secondary | ICD-10-CM

## 2022-03-23 DIAGNOSIS — F17210 Nicotine dependence, cigarettes, uncomplicated: Secondary | ICD-10-CM | POA: Diagnosis present

## 2022-03-23 DIAGNOSIS — Z91199 Patient's noncompliance with other medical treatment and regimen due to unspecified reason: Secondary | ICD-10-CM

## 2022-03-23 DIAGNOSIS — M79604 Pain in right leg: Secondary | ICD-10-CM | POA: Diagnosis not present

## 2022-03-23 DIAGNOSIS — R609 Edema, unspecified: Secondary | ICD-10-CM | POA: Diagnosis not present

## 2022-03-23 DIAGNOSIS — F1990 Other psychoactive substance use, unspecified, uncomplicated: Secondary | ICD-10-CM | POA: Diagnosis present

## 2022-03-23 DIAGNOSIS — E119 Type 2 diabetes mellitus without complications: Secondary | ICD-10-CM | POA: Diagnosis present

## 2022-03-23 DIAGNOSIS — F419 Anxiety disorder, unspecified: Secondary | ICD-10-CM | POA: Diagnosis present

## 2022-03-23 DIAGNOSIS — I82621 Acute embolism and thrombosis of deep veins of right upper extremity: Secondary | ICD-10-CM | POA: Diagnosis present

## 2022-03-23 DIAGNOSIS — F259 Schizoaffective disorder, unspecified: Secondary | ICD-10-CM | POA: Diagnosis present

## 2022-03-23 DIAGNOSIS — F1410 Cocaine abuse, uncomplicated: Secondary | ICD-10-CM | POA: Diagnosis present

## 2022-03-23 DIAGNOSIS — M79605 Pain in left leg: Secondary | ICD-10-CM | POA: Diagnosis not present

## 2022-03-23 DIAGNOSIS — K219 Gastro-esophageal reflux disease without esophagitis: Secondary | ICD-10-CM | POA: Diagnosis present

## 2022-03-23 DIAGNOSIS — F332 Major depressive disorder, recurrent severe without psychotic features: Secondary | ICD-10-CM

## 2022-03-23 DIAGNOSIS — F32A Depression, unspecified: Secondary | ICD-10-CM | POA: Diagnosis not present

## 2022-03-23 DIAGNOSIS — I69322 Dysarthria following cerebral infarction: Secondary | ICD-10-CM | POA: Diagnosis not present

## 2022-03-23 DIAGNOSIS — R569 Unspecified convulsions: Secondary | ICD-10-CM | POA: Diagnosis not present

## 2022-03-23 DIAGNOSIS — Z91010 Allergy to peanuts: Secondary | ICD-10-CM | POA: Diagnosis not present

## 2022-03-23 DIAGNOSIS — Z7151 Drug abuse counseling and surveillance of drug abuser: Secondary | ICD-10-CM

## 2022-03-23 DIAGNOSIS — Z818 Family history of other mental and behavioral disorders: Secondary | ICD-10-CM

## 2022-03-23 DIAGNOSIS — R45851 Suicidal ideations: Secondary | ICD-10-CM | POA: Diagnosis present

## 2022-03-23 DIAGNOSIS — R9431 Abnormal electrocardiogram [ECG] [EKG]: Secondary | ICD-10-CM | POA: Diagnosis not present

## 2022-03-23 DIAGNOSIS — Z813 Family history of other psychoactive substance abuse and dependence: Secondary | ICD-10-CM

## 2022-03-23 DIAGNOSIS — F1210 Cannabis abuse, uncomplicated: Secondary | ICD-10-CM | POA: Diagnosis not present

## 2022-03-23 DIAGNOSIS — I509 Heart failure, unspecified: Secondary | ICD-10-CM

## 2022-03-23 DIAGNOSIS — Z993 Dependence on wheelchair: Secondary | ICD-10-CM

## 2022-03-23 DIAGNOSIS — Z5900 Homelessness unspecified: Secondary | ICD-10-CM | POA: Diagnosis not present

## 2022-03-23 DIAGNOSIS — F1010 Alcohol abuse, uncomplicated: Secondary | ICD-10-CM | POA: Diagnosis present

## 2022-03-23 DIAGNOSIS — G8929 Other chronic pain: Secondary | ICD-10-CM | POA: Diagnosis present

## 2022-03-23 DIAGNOSIS — F339 Major depressive disorder, recurrent, unspecified: Secondary | ICD-10-CM | POA: Diagnosis present

## 2022-03-23 DIAGNOSIS — I82611 Acute embolism and thrombosis of superficial veins of right upper extremity: Secondary | ICD-10-CM | POA: Diagnosis present

## 2022-03-23 DIAGNOSIS — T43506A Underdosing of unspecified antipsychotics and neuroleptics, initial encounter: Secondary | ICD-10-CM | POA: Diagnosis not present

## 2022-03-23 DIAGNOSIS — I82629 Acute embolism and thrombosis of deep veins of unspecified upper extremity: Secondary | ICD-10-CM

## 2022-03-23 DIAGNOSIS — Z91128 Patient's intentional underdosing of medication regimen for other reason: Secondary | ICD-10-CM | POA: Diagnosis not present

## 2022-03-23 DIAGNOSIS — Z886 Allergy status to analgesic agent status: Secondary | ICD-10-CM

## 2022-03-23 DIAGNOSIS — Z9151 Personal history of suicidal behavior: Secondary | ICD-10-CM

## 2022-03-23 DIAGNOSIS — F319 Bipolar disorder, unspecified: Secondary | ICD-10-CM | POA: Diagnosis present

## 2022-03-23 DIAGNOSIS — A419 Sepsis, unspecified organism: Secondary | ICD-10-CM | POA: Diagnosis not present

## 2022-03-23 DIAGNOSIS — F1910 Other psychoactive substance abuse, uncomplicated: Secondary | ICD-10-CM | POA: Diagnosis present

## 2022-03-23 LAB — COMPREHENSIVE METABOLIC PANEL
ALT: 21 U/L (ref 0–44)
AST: 26 U/L (ref 15–41)
Albumin: 3.4 g/dL — ABNORMAL LOW (ref 3.5–5.0)
Alkaline Phosphatase: 125 U/L (ref 38–126)
Anion gap: 10 (ref 5–15)
BUN: 19 mg/dL (ref 6–20)
CO2: 26 mmol/L (ref 22–32)
Calcium: 9 mg/dL (ref 8.9–10.3)
Chloride: 103 mmol/L (ref 98–111)
Creatinine, Ser: 1.14 mg/dL (ref 0.61–1.24)
GFR, Estimated: >60 mL/min (ref >60)
Glucose, Bld: 84 mg/dL (ref 70–99)
Potassium: 4.1 mmol/L (ref 3.5–5.1)
Sodium: 139 mmol/L (ref 135–145)
Total Bilirubin: 1 mg/dL (ref 0.3–1.2)
Total Protein: 7.6 g/dL (ref 6.5–8.1)

## 2022-03-23 LAB — CBC WITH DIFFERENTIAL/PLATELET
Abs Immature Granulocytes: 0.03 10*3/uL (ref 0.00–0.07)
Basophils Absolute: 0 10*3/uL (ref 0.0–0.1)
Basophils Relative: 0 %
Eosinophils Absolute: 0 10*3/uL (ref 0.0–0.5)
Eosinophils Relative: 0 %
HCT: 44 % (ref 39.0–52.0)
Hemoglobin: 14.2 g/dL (ref 13.0–17.0)
Immature Granulocytes: 0 %
Lymphocytes Relative: 27 %
Lymphs Abs: 2.1 10*3/uL (ref 0.7–4.0)
MCH: 30.9 pg (ref 26.0–34.0)
MCHC: 32.3 g/dL (ref 30.0–36.0)
MCV: 95.7 fL (ref 80.0–100.0)
Monocytes Absolute: 0.7 10*3/uL (ref 0.1–1.0)
Monocytes Relative: 9 %
Neutro Abs: 4.9 10*3/uL (ref 1.7–7.7)
Neutrophils Relative %: 64 %
Platelets: 520 10*3/uL — ABNORMAL HIGH (ref 150–400)
RBC: 4.6 MIL/uL (ref 4.22–5.81)
RDW: 14.6 % (ref 11.5–15.5)
WBC: 7.8 10*3/uL (ref 4.0–10.5)
nRBC: 0 % (ref 0.0–0.2)

## 2022-03-23 LAB — SEDIMENTATION RATE: Sed Rate: 63 mm/hr — ABNORMAL HIGH (ref 0–16)

## 2022-03-23 LAB — C-REACTIVE PROTEIN: CRP: 10.6 mg/dL — ABNORMAL HIGH (ref <1.0)

## 2022-03-23 LAB — ETHANOL: Alcohol, Ethyl (B): <10 mg/dL (ref <10)

## 2022-03-23 LAB — D-DIMER, QUANTITATIVE: D-Dimer, Quant: 0.71 ug/mL-FEU — ABNORMAL HIGH (ref 0.00–0.50)

## 2022-03-23 IMAGING — CR DG FOOT COMPLETE 3+V*L*
3 series · 3 of 3 positions shown · non-contrast
Comparison: Left foot radiographs [DATE]

CLINICAL DATA: Shortness of breath. Foot wounds. Homeless.
Diabetes.

EXAM:
LEFT FOOT - COMPLETE 3+ VIEW

[x foot ap left]
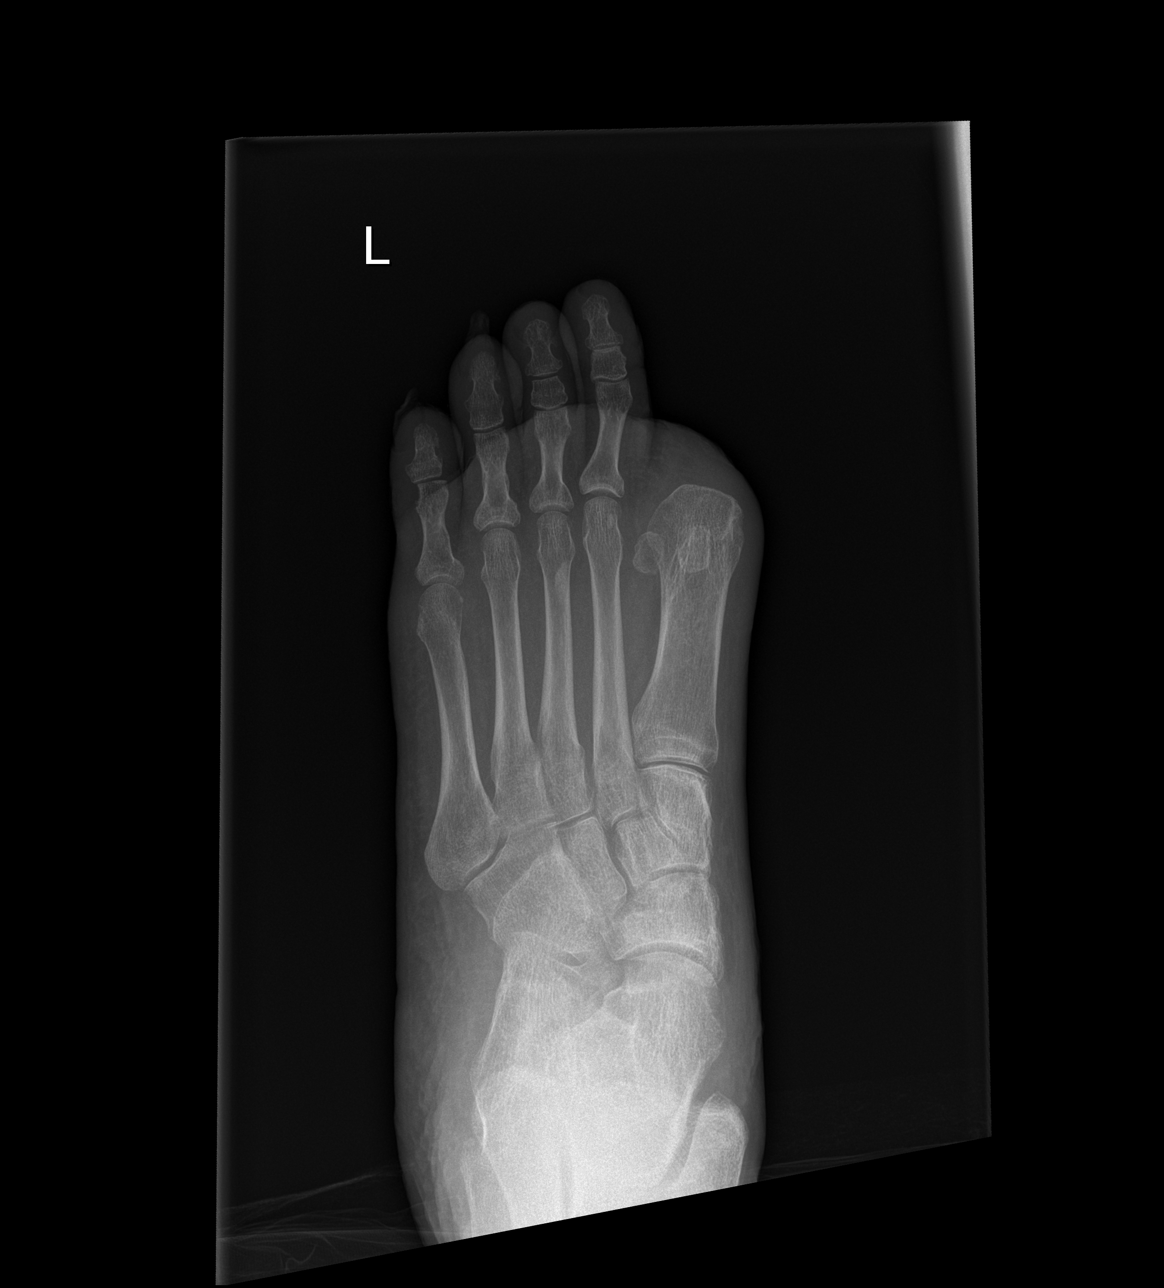

[x foot obl left]
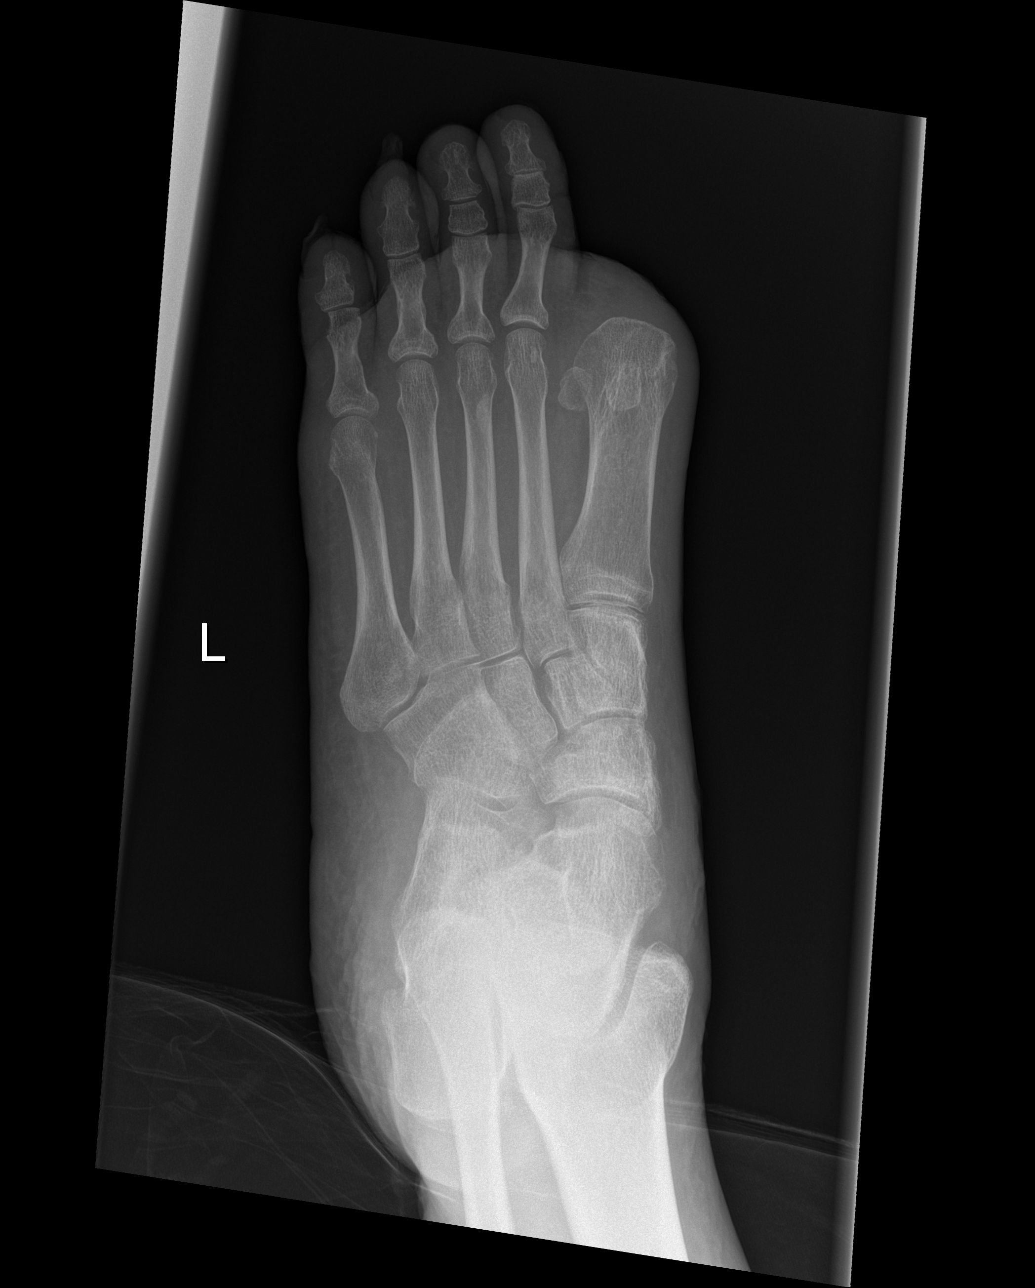

[x foot lat left]
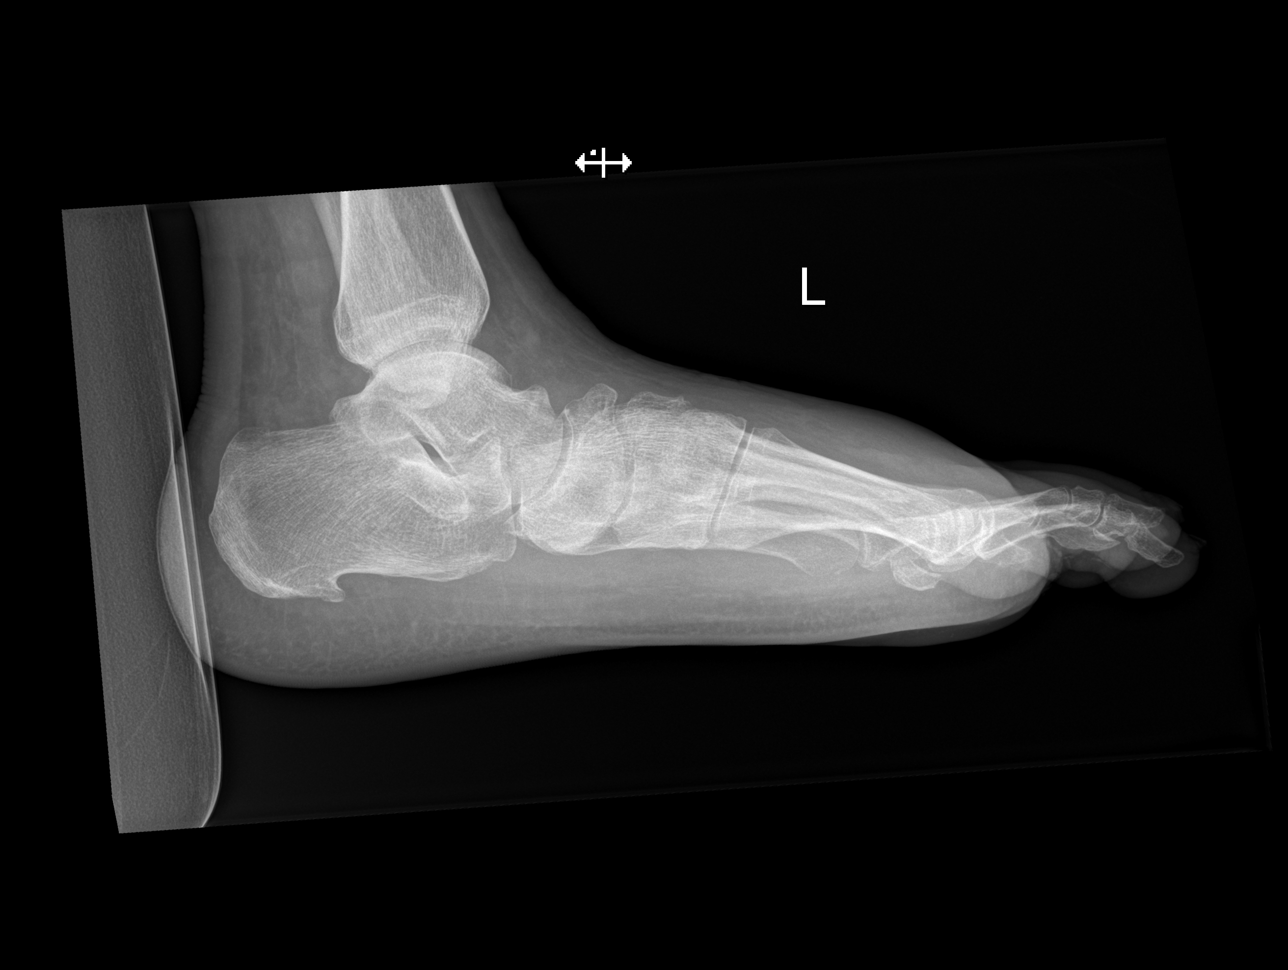

[3 of 3 positions shown; findings below may reference images not displayed]

FINDINGS: Redemonstration of amputation of the great toe phalanges.
Mild-to-moderate forefoot soft tissue swelling is similar to prior.
Mild plantar calcaneal heel spur. Moderate pes planus. Moderate
dorsal talonavicular navicular-cuneiform degenerative osteophytes.

No acute fracture or dislocation. No cortical erosion is seen. No
radiopaque foreign body or subcutaneous air.
IMPRESSION: No significant change from prior. There is history of amputation of
the great toe proximal phalanges.

Moderate pes planus.

Mild-to-moderate forefoot soft tissue swelling. No radiographic
evidence of acute osteomyelitis.

## 2022-03-23 IMAGING — MR MR ANKLE*R* W/O CM
2 series · 40 of 40 positions shown · non-contrast
Comparison: Ankle radiograph [DATE].

CLINICAL DATA: Swelling

EXAM:
MRI OF THE RIGHT ANKLE WITHOUT CONTRAST
TECHNIQUE: Multiplanar, multisequence MR imaging of the ankle was performed. No
intravenous contrast was administered.

[Series 2: T2 fat-sat · axial · right · 4.0mm · 0.42mm/px · z∈[-93,+85]mm · 20 of 37 slices shown]
[im 1/37]
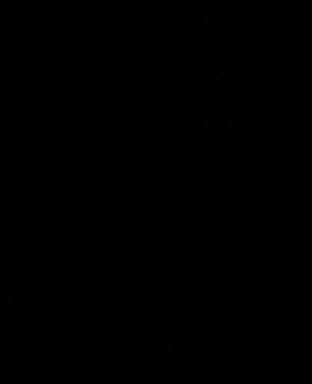
[im 2/37]
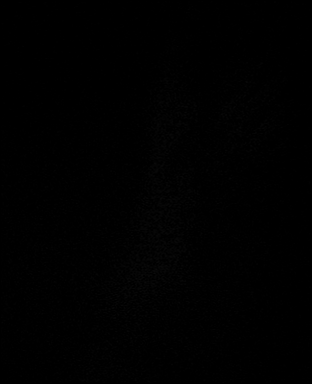
[im 4/37]
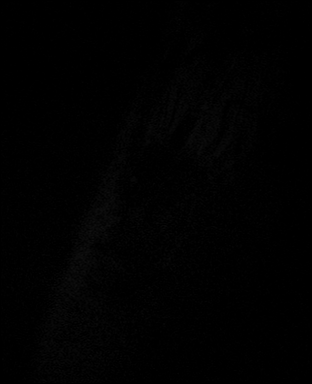
[im 6/37]
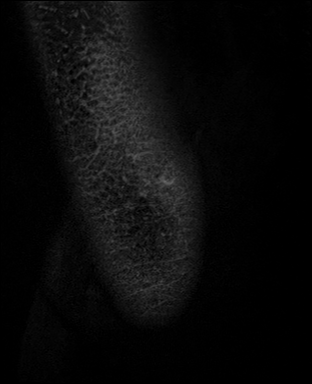
[im 8/37]
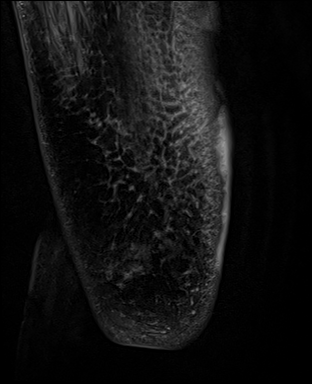
[im 10/37]
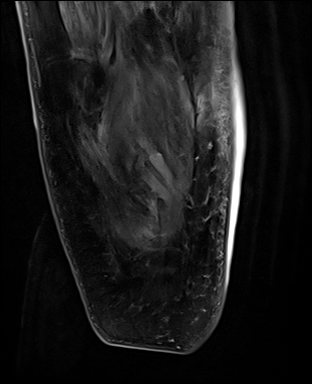
[im 12/37]
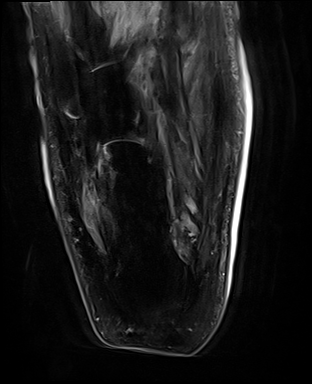
[im 14/37]
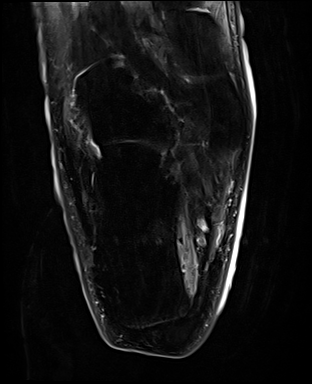
[im 16/37]
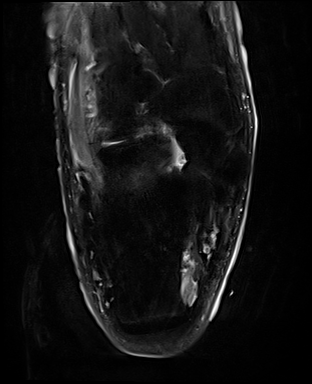
[im 18/37]
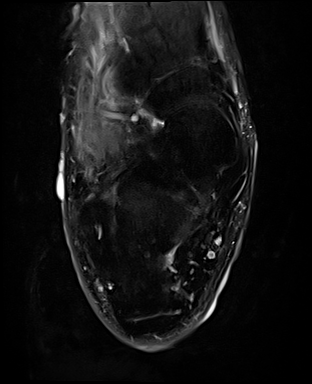
[im 19/37]
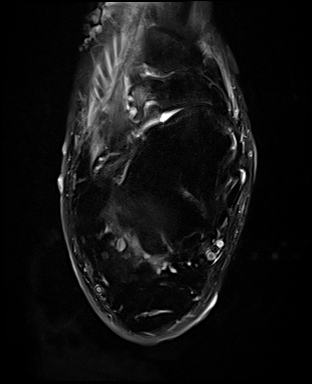
[im 21/37]
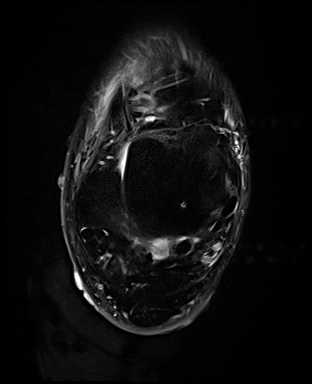
[im 23/37]
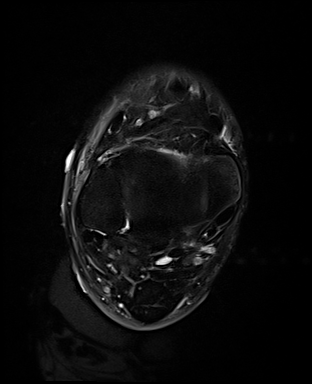
[im 25/37]
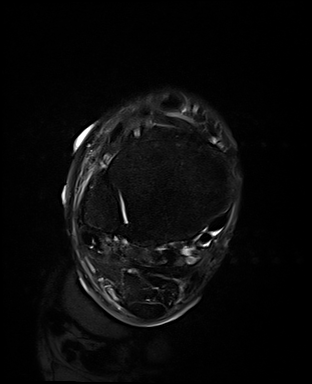
[im 27/37]
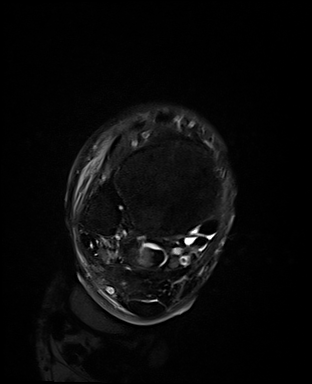
[im 29/37]
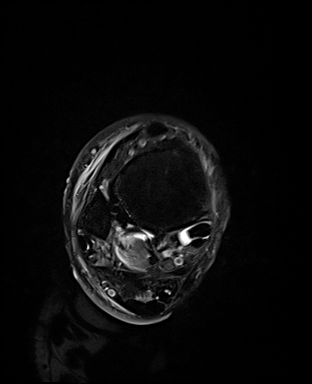
[im 31/37]
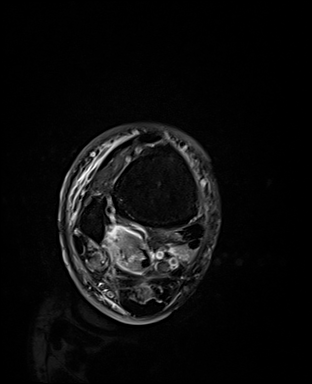
[im 33/37]
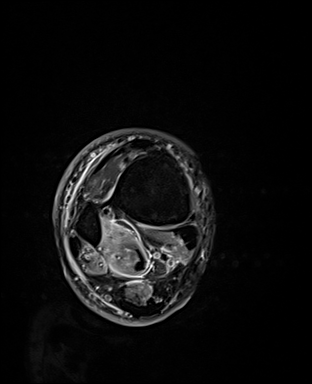
[im 35/37]
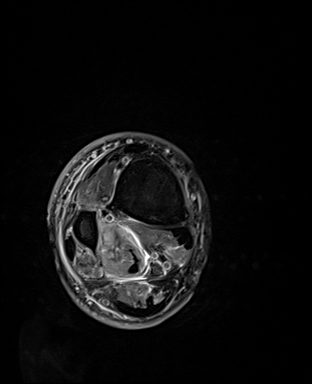
[im 37/37]
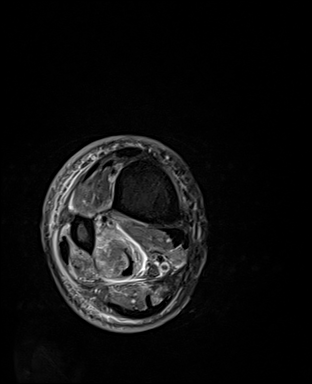

[Series 3: PD fat-sat · axial · right · 4.0mm · 0.50mm/px · z∈[-93,+85]mm · 20 of 37 slices shown]
[im 1/37]
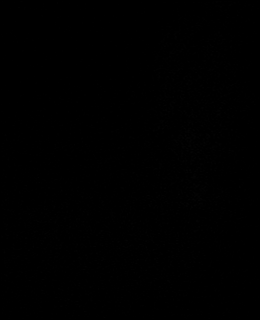
[im 2/37]
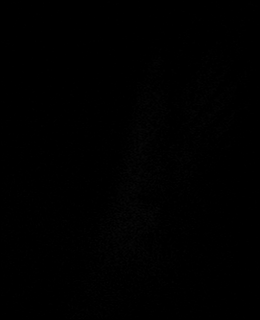
[im 4/37]
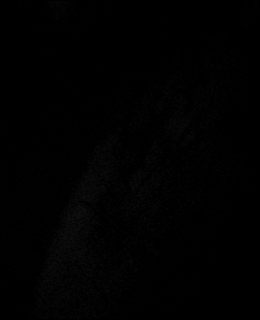
[im 6/37]
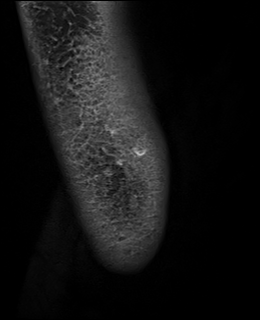
[im 8/37]
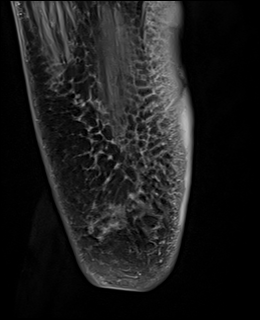
[im 10/37]
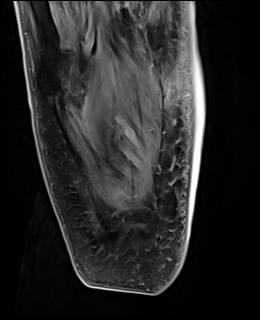
[im 12/37]
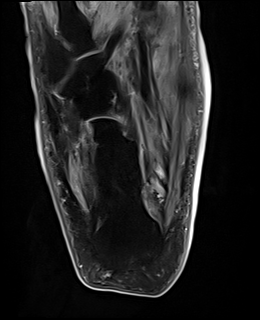
[im 14/37]
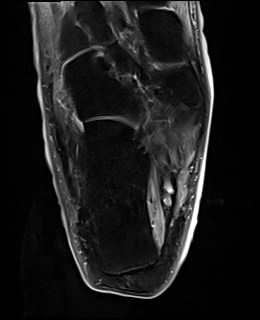
[im 16/37]
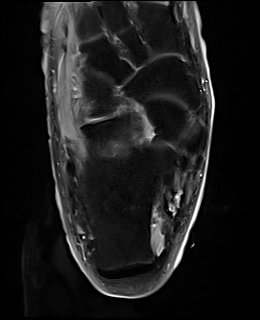
[im 18/37]
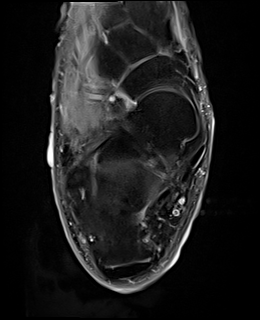
[im 19/37]
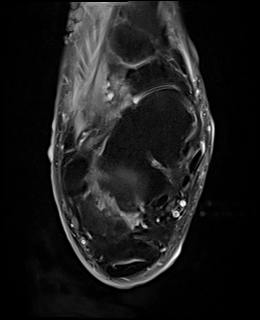
[im 21/37]
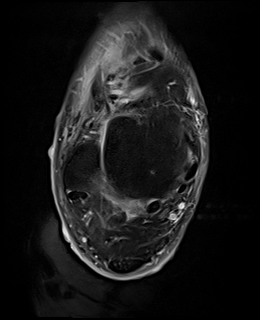
[im 23/37]
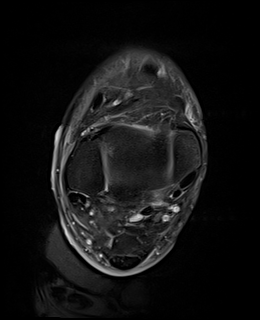
[im 25/37]
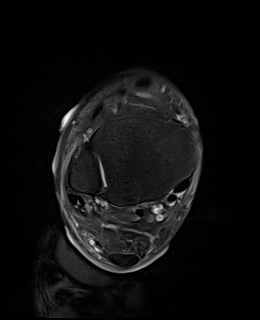
[im 27/37]
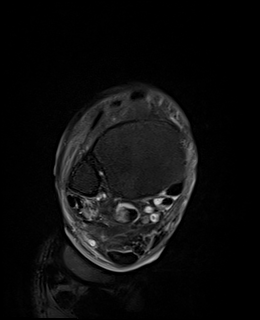
[im 29/37]
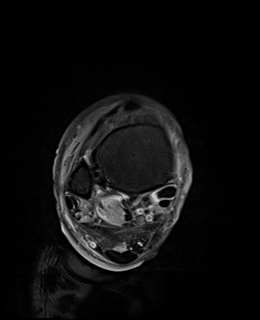
[im 31/37]
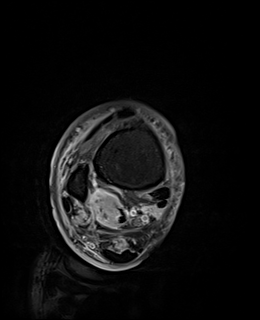
[im 33/37]
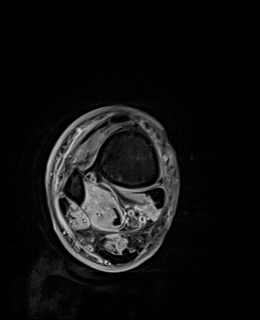
[im 35/37]
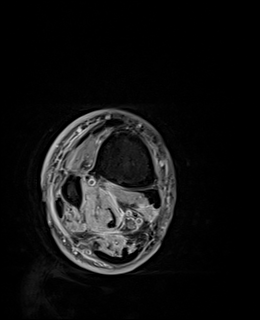
[im 37/37]
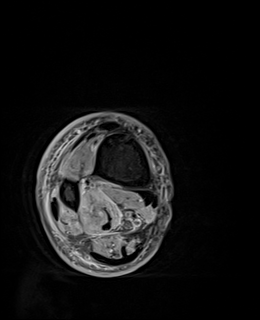

[40 of 40 positions shown; findings below may reference images not displayed]

FINDINGS: Technologist note: Patient refused further imaging, best obtainable
images (only 2 axial sequences were acquired)

TENDONS

Peroneal: Intact peroneus longus and peroneus brevis tendons.

Posteromedial: Mild tenosynovitis of the supramalleolar posterior
tibial tendon and flexor digitorum longus. Intact flexor hallucis
longus tendon.

Anterior: Intact tibialis anterior, extensor hallucis longus and
extensor digitorum longus tendons.

Achilles: Intact.

Plantar Fascia: Not well evaluated only in the axial plane.

LIGAMENTS

Lateral: The ATFL is likely intact. Calcaneofibular ligament is
likely intact. Posterior talofibular ligament intact. Anterior and
posterior tibiofibular ligaments intact.

Medial: Deltoid ligament intact. Spring ligament intact.

CARTILAGE

Ankle Joint: Trace tibiotalar joint effusion. Poorly evaluated
tibiotalar cartilage in the axial plane

Subtalar Joints/Sinus Tarsi: Poorly evaluated sinus tarsi and
subtalar joints in the axial plane.

Bones: There is minimal reactive marrow edema at the medial and
lateral malleoli and at the talonavicular joint. Plantar calcaneal
spur.

Soft Tissue: There is superficial soft tissue swelling and
appearance of blister-like skin lesions along the ankle.
IMPRESSION: Limited MRI in the axial plane. Superficial soft tissue swelling and
appearance of blister-like skin lesions along the ankle. No
well-defined/drainable fluid collection. Minimal reactive marrow
edema in the medial and lateral malleoli, without suspicion for
osteomyelitis on limited axial T2 weighted MRI.

## 2022-03-23 IMAGING — MR MR FOOT*R* W/O CM
7 series · 40 of 40 positions shown · non-contrast
Comparison: Radiographs dated [DATE]

CLINICAL DATA: Foot swelling, diabetic, osteomyelitis suspected.

EXAM:
MRI OF THE RIGHT FOREFOOT WITHOUT CONTRAST
TECHNIQUE: Multiplanar, multisequence MR imaging of the right forefoot was
performed. No intravenous contrast was administered.

[Series 4: T1 · coronal · right · 3.0mm · 0.47mm/px · 5 of 46 slices shown (1 of 2)]
[im 1/46]
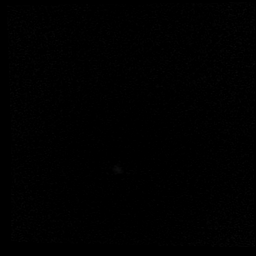
[im 12/46]
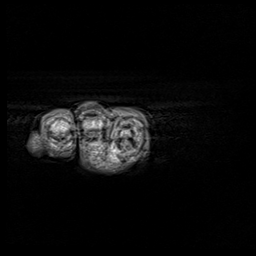
[im 23/46]
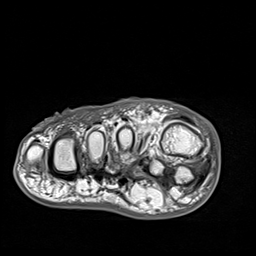
[im 34/46]
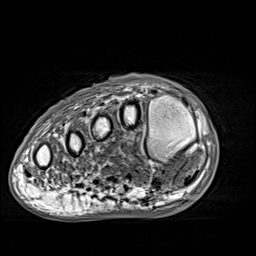
[im 46/46]
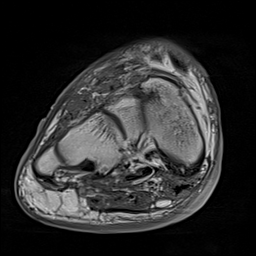

[Series 5: T2 fat-sat · coronal · right · 3.0mm · 0.38mm/px · 7 of 47 slices shown (1 of 3)]
[im 1/47]
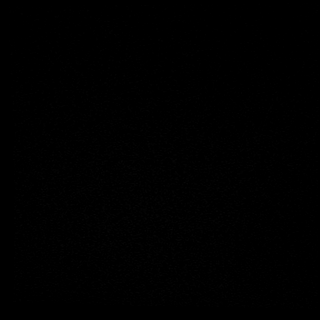
[im 8/47]
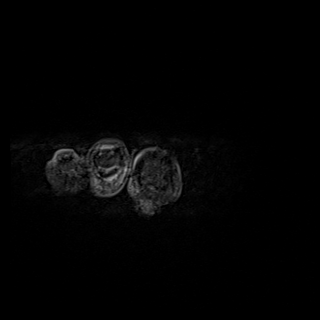
[im 16/47]
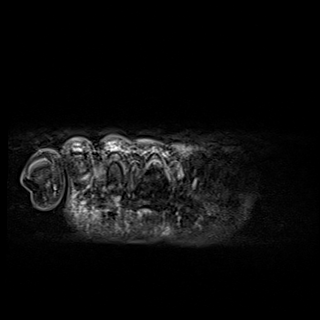
[im 24/47]
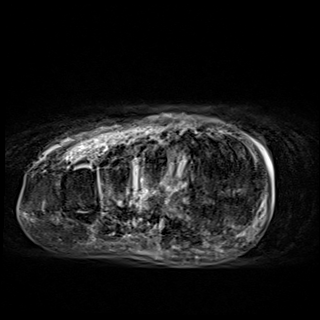
[im 31/47]
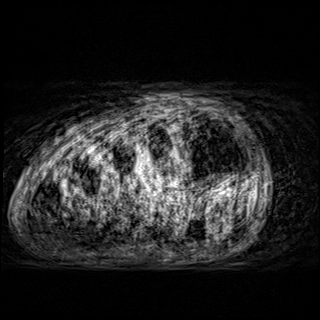
[im 39/47]
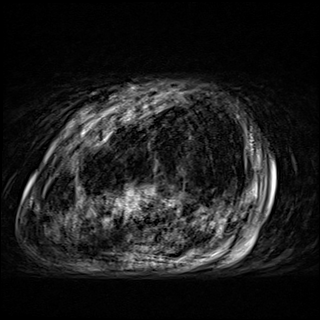
[im 47/47]
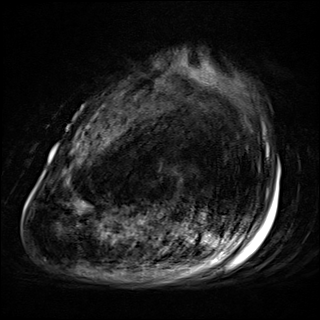

[Series 6: T2 fat-sat · axial · right · 3.0mm · 0.70mm/px · z∈[-145,-41]mm · 5 of 33 slices shown (2 of 3)]
[im 1/33]
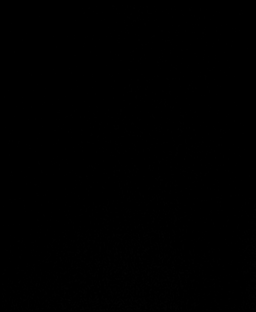
[im 9/33]
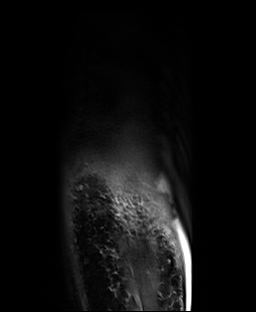
[im 17/33]
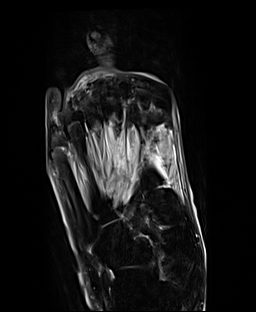
[im 25/33]
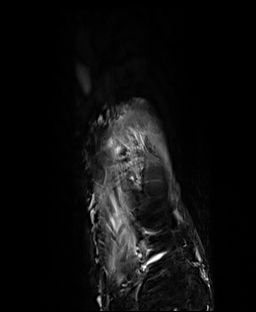
[im 33/33]
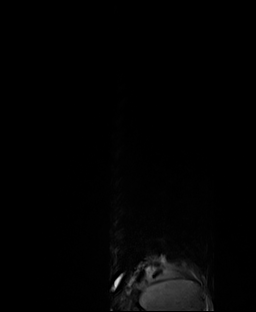

[Series 7: T1 · axial · right · 3.0mm · 0.70mm/px · z∈[-145,-41]mm · 5 of 33 slices shown (2 of 2)]
[im 1/33]
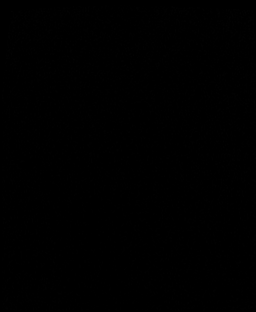
[im 9/33]
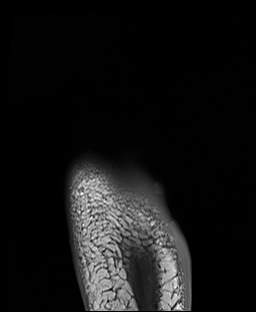
[im 17/33]
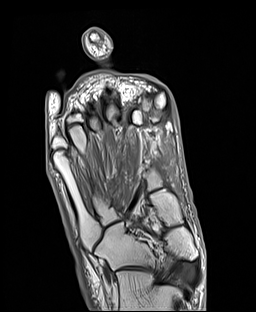
[im 25/33]
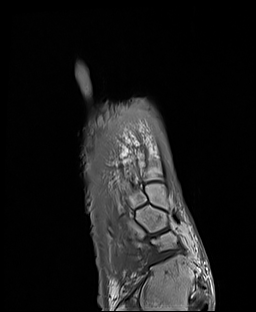
[im 33/33]
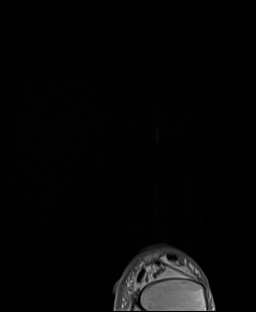

[Series 9: T1 fat-sat · coronal · non-contrast · right · 3.0mm · 0.51mm/px · 6 of 45 slices shown]
[im 1/45]
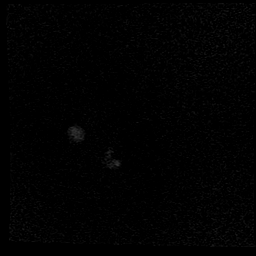
[im 9/45]
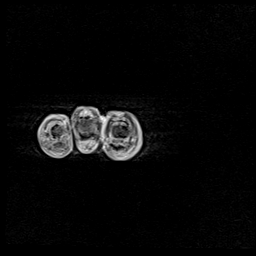
[im 18/45]
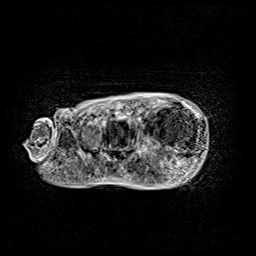
[im 27/45]
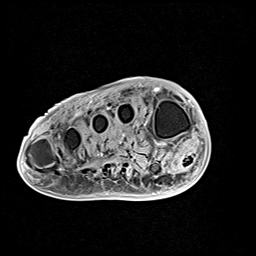
[im 36/45]
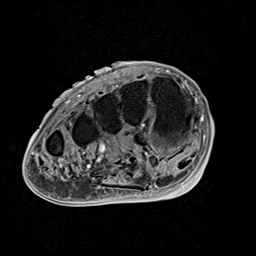
[im 45/45]
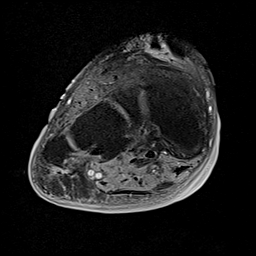

[Series 10: STIR · sagittal · right · 3.0mm · 0.47mm/px · 5 of 33 slices shown]
[im 1/33]
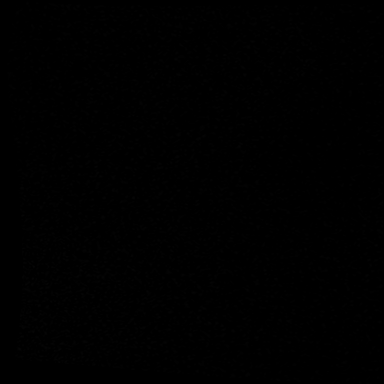
[im 9/33]
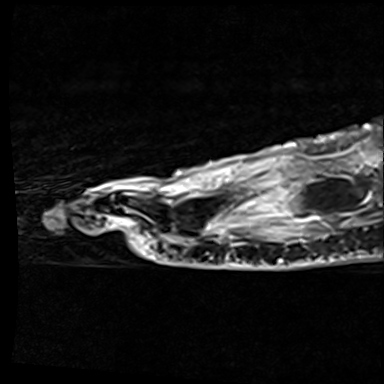
[im 17/33]
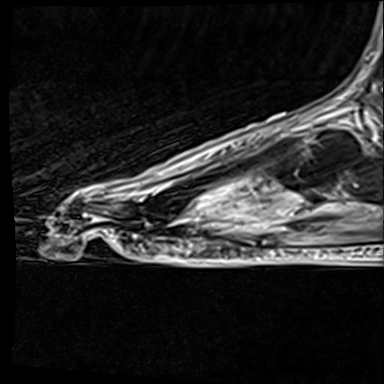
[im 25/33]
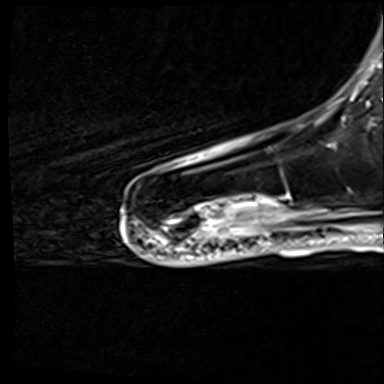
[im 33/33]
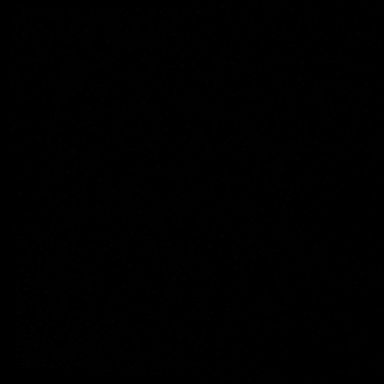

[Series 11: T2 fat-sat · coronal · right · 3.0mm · 0.41mm/px · 7 of 49 slices shown (3 of 3)]
[im 1/49]
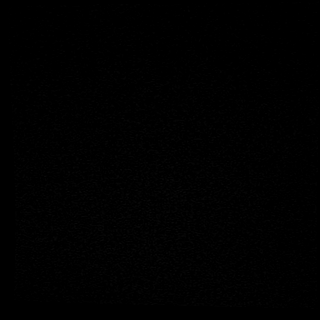
[im 9/49]
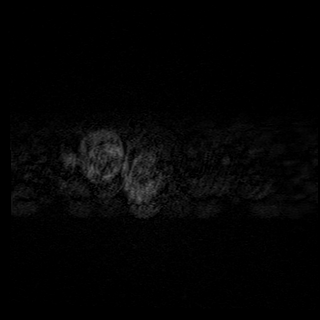
[im 17/49]
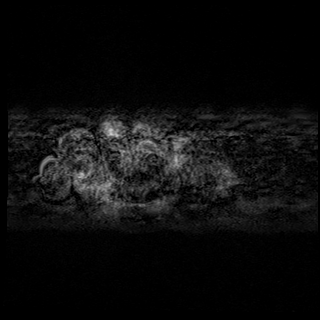
[im 25/49]
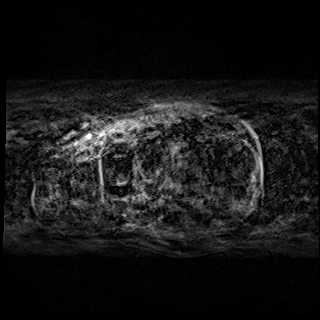
[im 33/49]
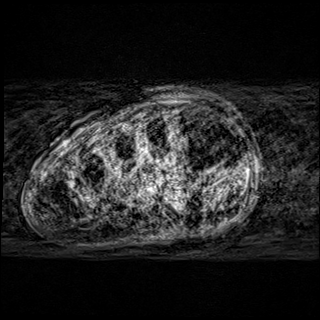
[im 41/49]
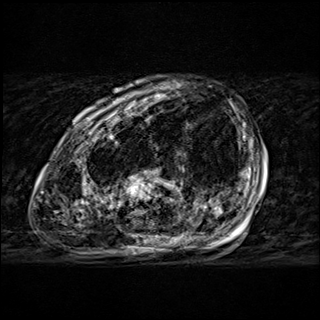
[im 49/49]
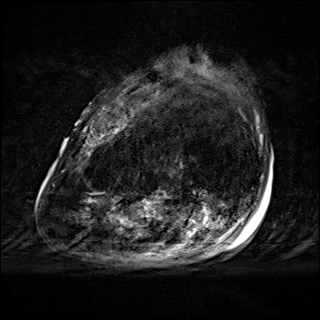

[40 of 40 positions shown; findings below may reference images not displayed]

FINDINGS: Bones/Joint/Cartilage

Status post prior amputation through the first metatarsophalangeal
joint. No evidence of fracture or dislocation. Mild bone marrow
edema of the proximal phalanx of the second digit.

Ligaments

Collateral ligaments are intact.  Lisfranc ligament is intact.

Muscles and Tendons

Evaluation of flexor and extensor tendons is limited due to motion.
Increased intramuscular signal of the plantar muscles concerning for
diabetic myopathy/myositis.

Soft tissue
Soft tissue edema about the dorsum of the foot. Skin irregularity
about the dorsal aspect of the proximal phalanx of the fifth digit.
No drainable fluid collection or abscess. No soft tissue mass.
IMPRESSION: 1. Postsurgical changes for prior amputation at the first
metatarsophalangeal joint.
2. Mildly heterogeneous signal of the proximal phalanx of the second
digit and proximal phalanx of the fifth digit. This is nonspecific
it could be marrow edema secondary to altered mechanics. In the
presence of adjacent skin wound early osteomyelitis can not be
excluded.
3. Skin irregularity about the dorsal/lateral aspect of the fifth
digit without evidence of adjacent drainable fluid collection or
abscess.

## 2022-03-23 IMAGING — CR DG CHEST 2V
2 series · 2 of 2 positions shown · non-contrast
Comparison: AP chest [DATE] CT chest [DATE]

CLINICAL DATA: Shortness of breath.

EXAM:
CHEST - 2 VIEW

[w chest lat]
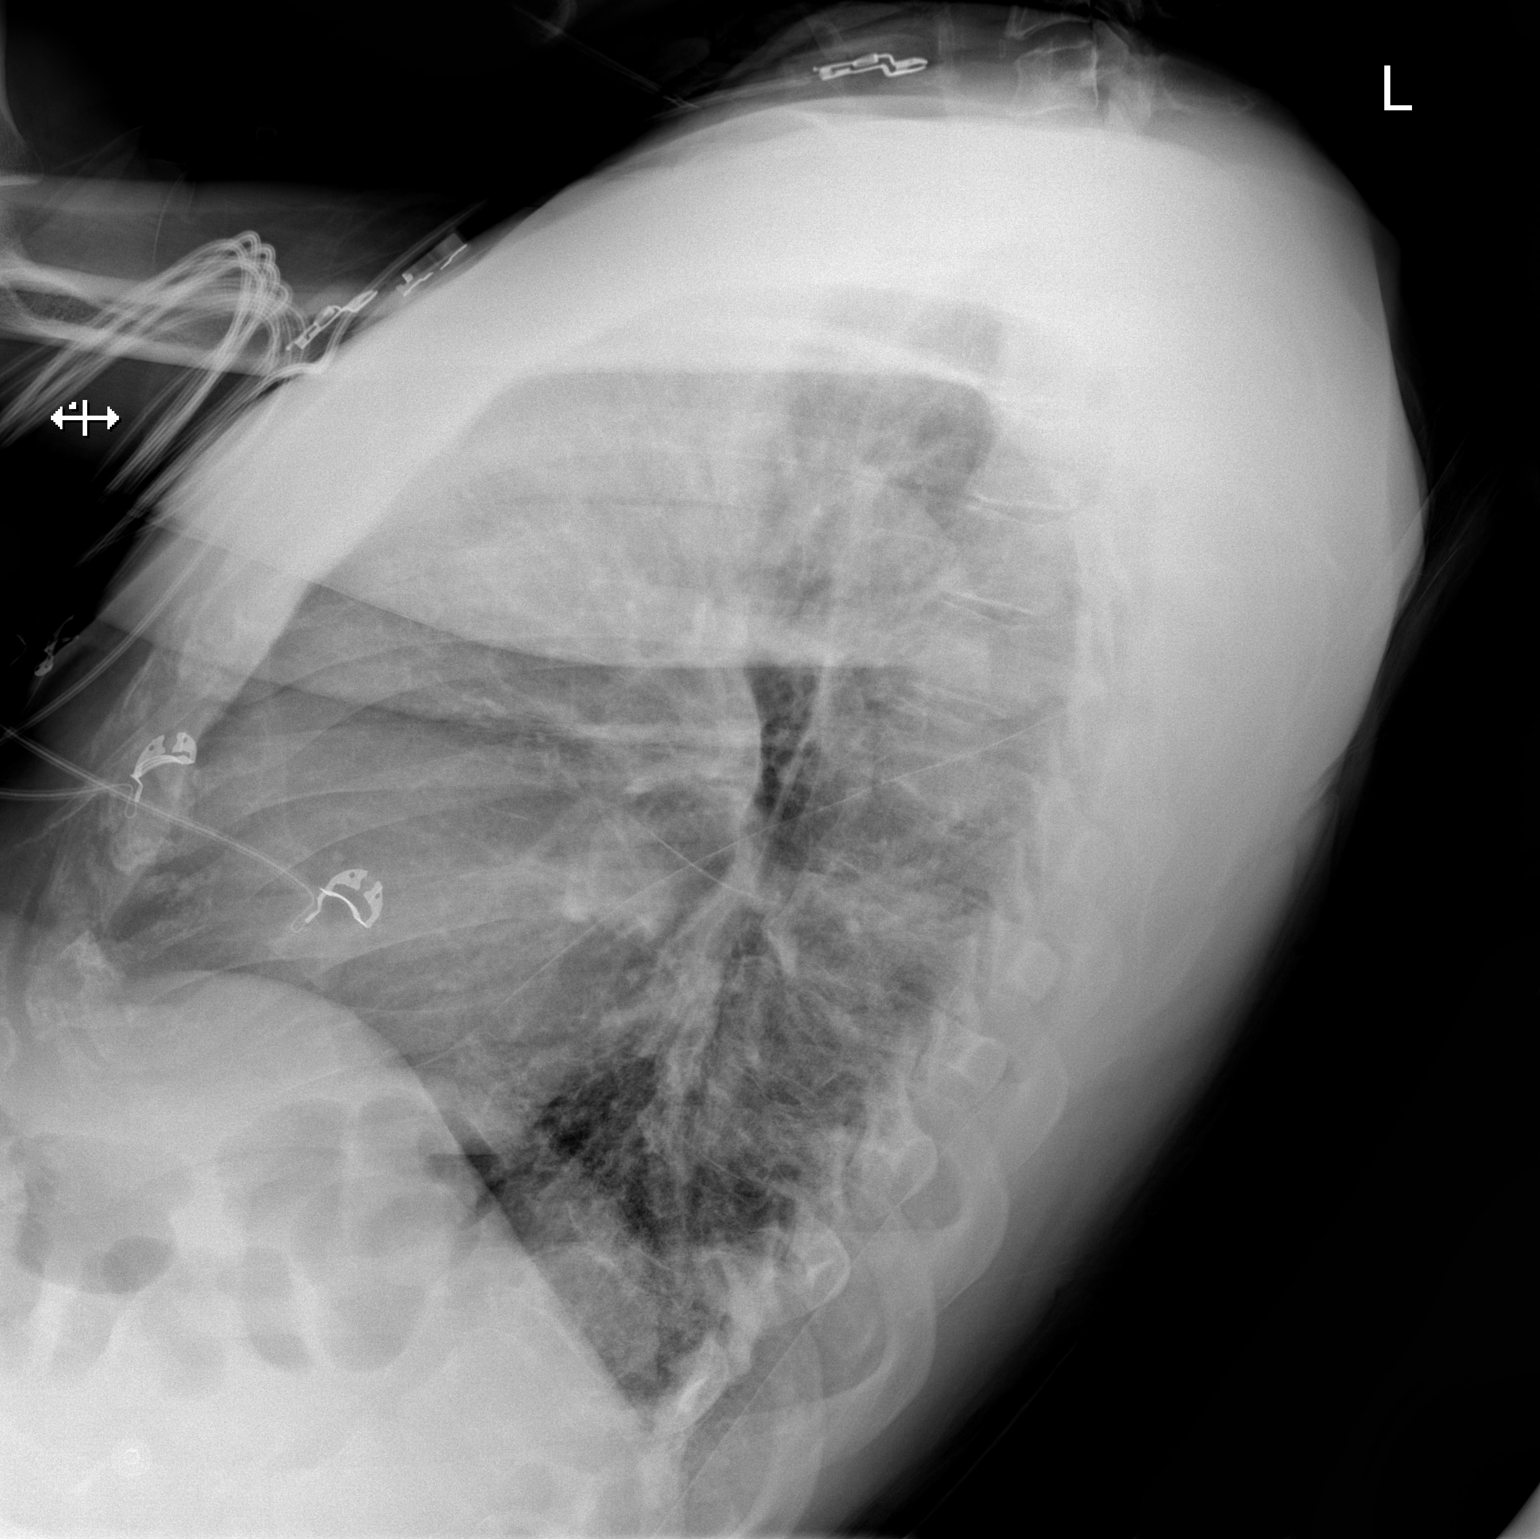

[x chest ap]
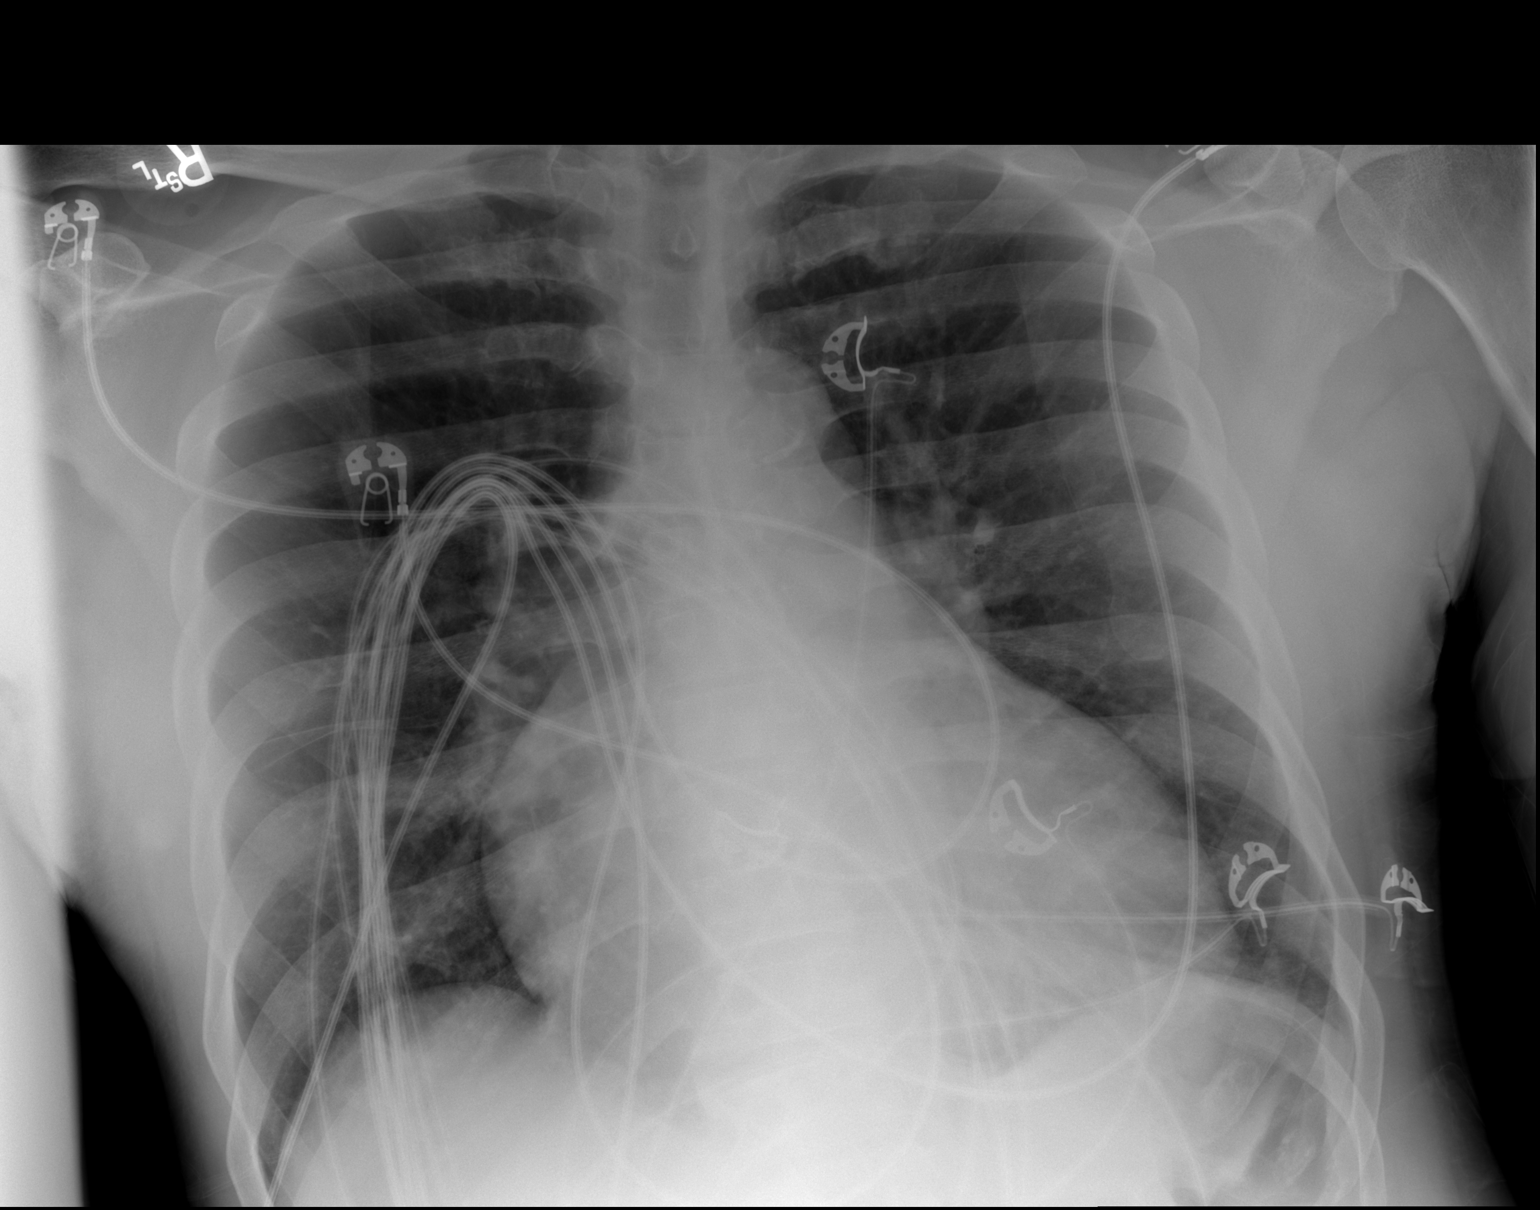

[2 of 2 positions shown; findings below may reference images not displayed]

FINDINGS: Cardiac silhouette is again moderately to markedly enlarged.
Mediastinal contours are within normal limits with calcification
again seen within aortic arch. The lungs are clear. No pleural
effusion or pneumothorax. Mild multilevel degenerative disc changes
of the thoracic spine.
IMPRESSION: Unchanged cardiomegaly without acute lung process.

## 2022-03-23 IMAGING — CR DG FOOT COMPLETE 3+V*R*
4 series · 4 of 4 positions shown · non-contrast
Comparison: Right foot radiographs [DATE]

CLINICAL DATA: Foot wounds. Diabetes. Homeless. Evaluate for
infection.

EXAM:
RIGHT FOOT COMPLETE - 3+ VIEW

[x foot lat right]
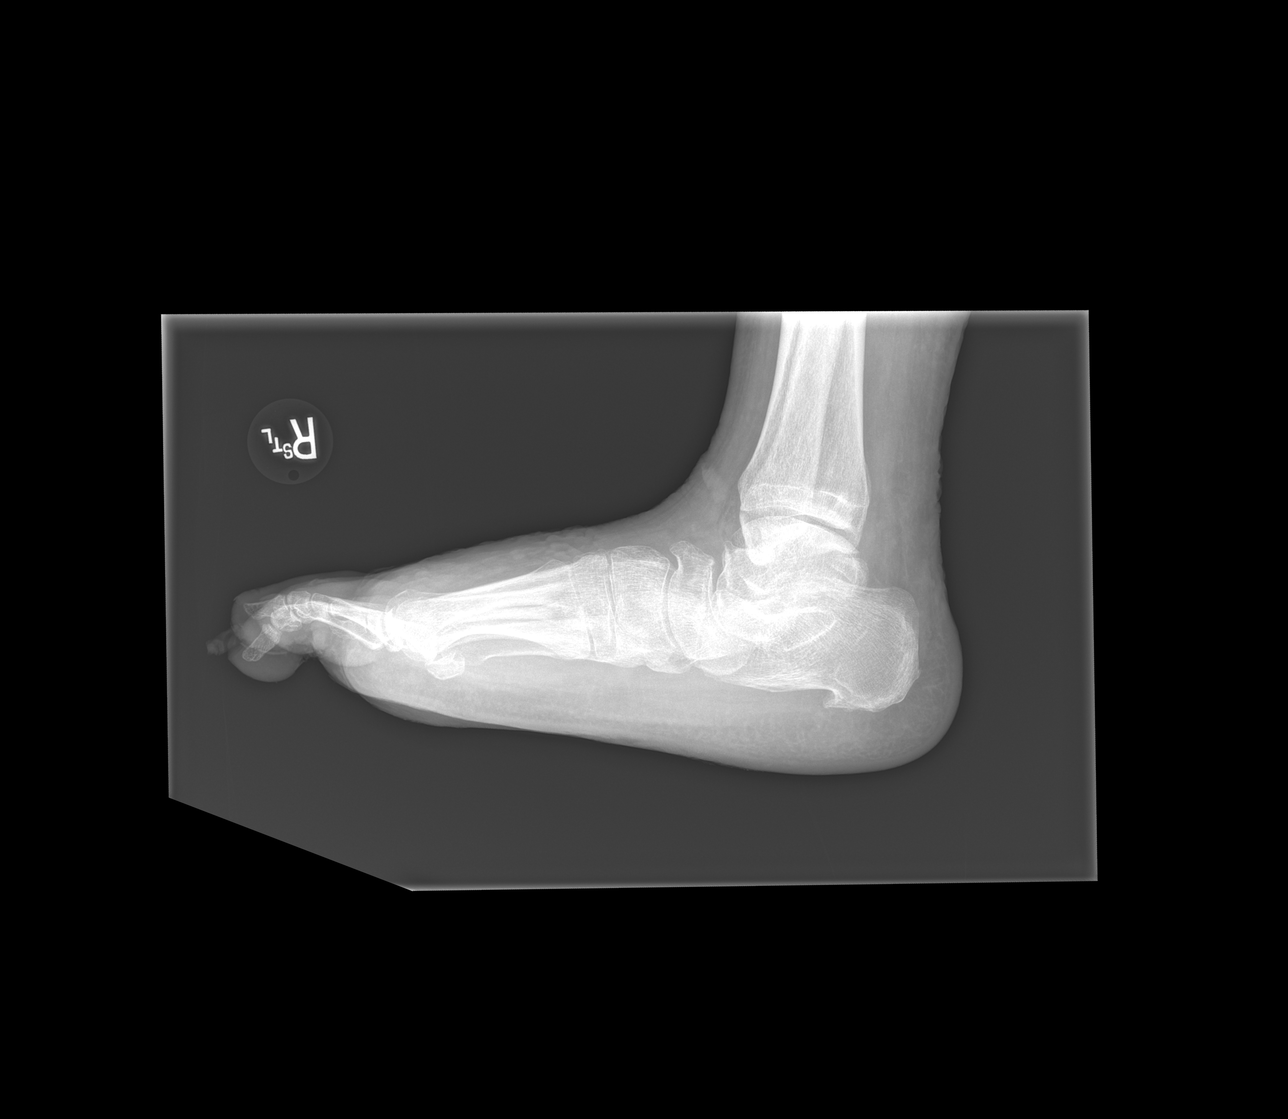

[x foot ap right (1 of 2)]
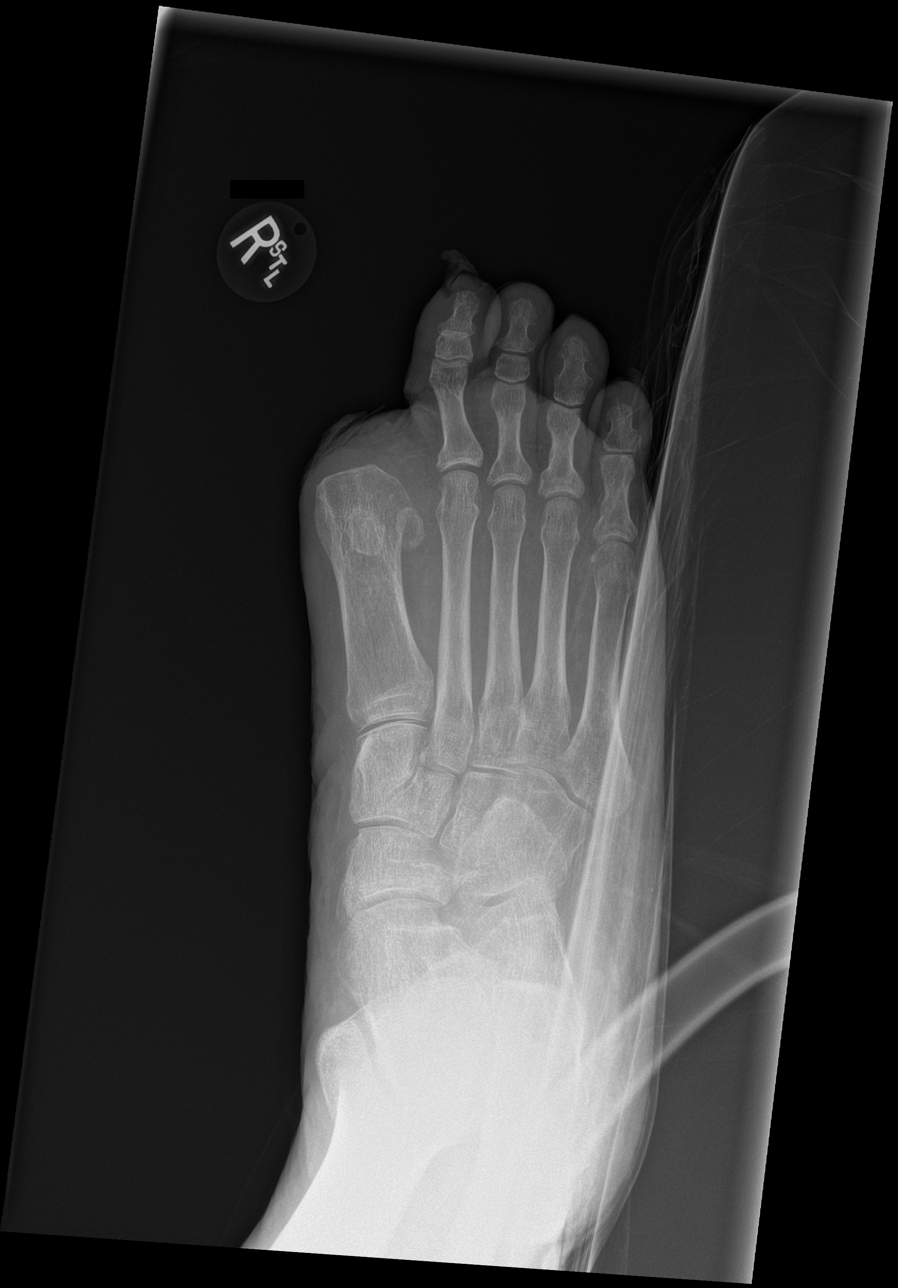

[x foot obl right]
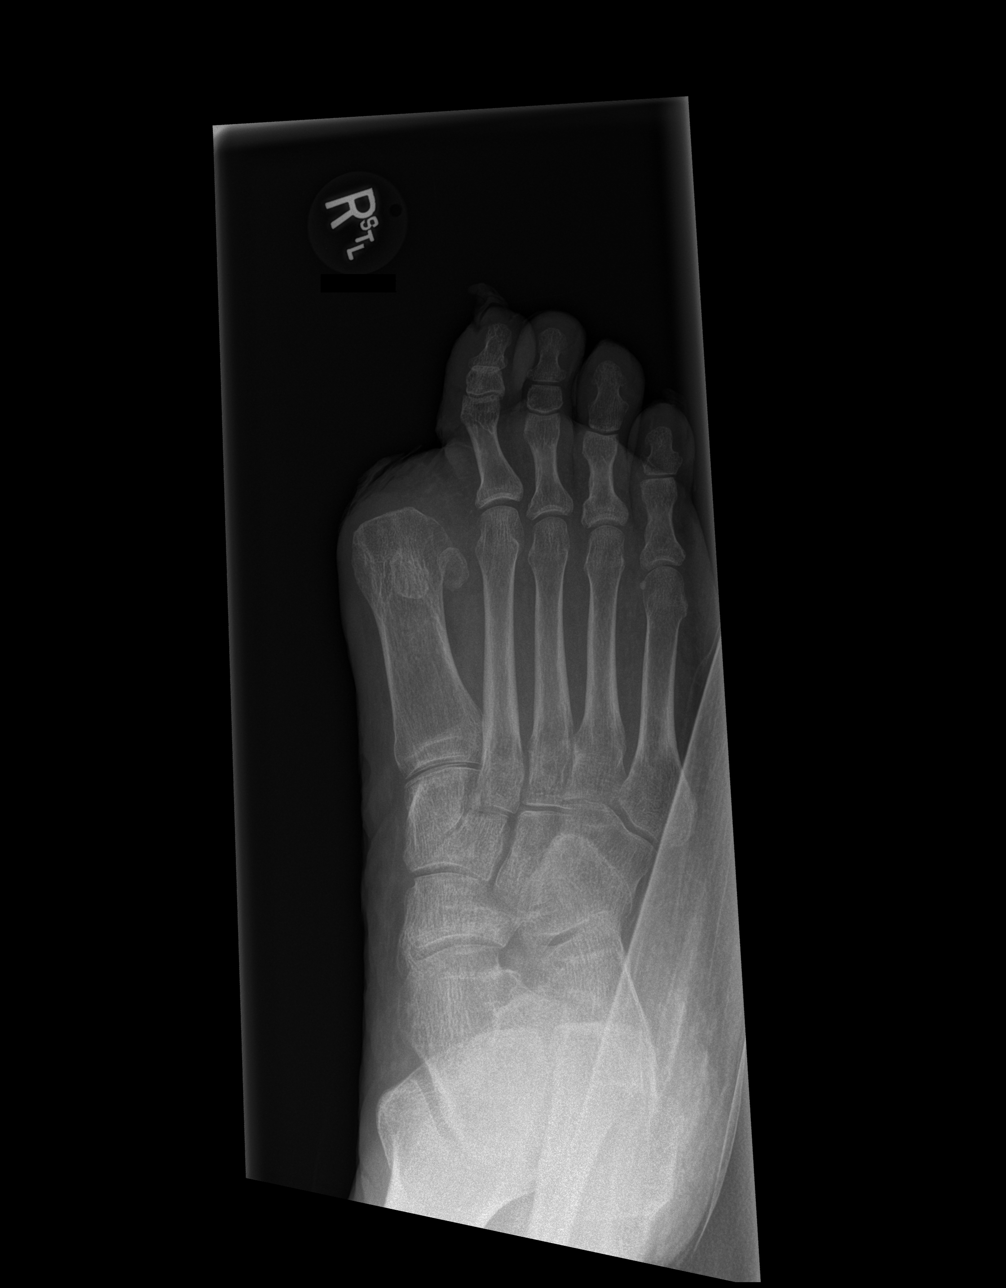

[x foot ap right (2 of 2)]
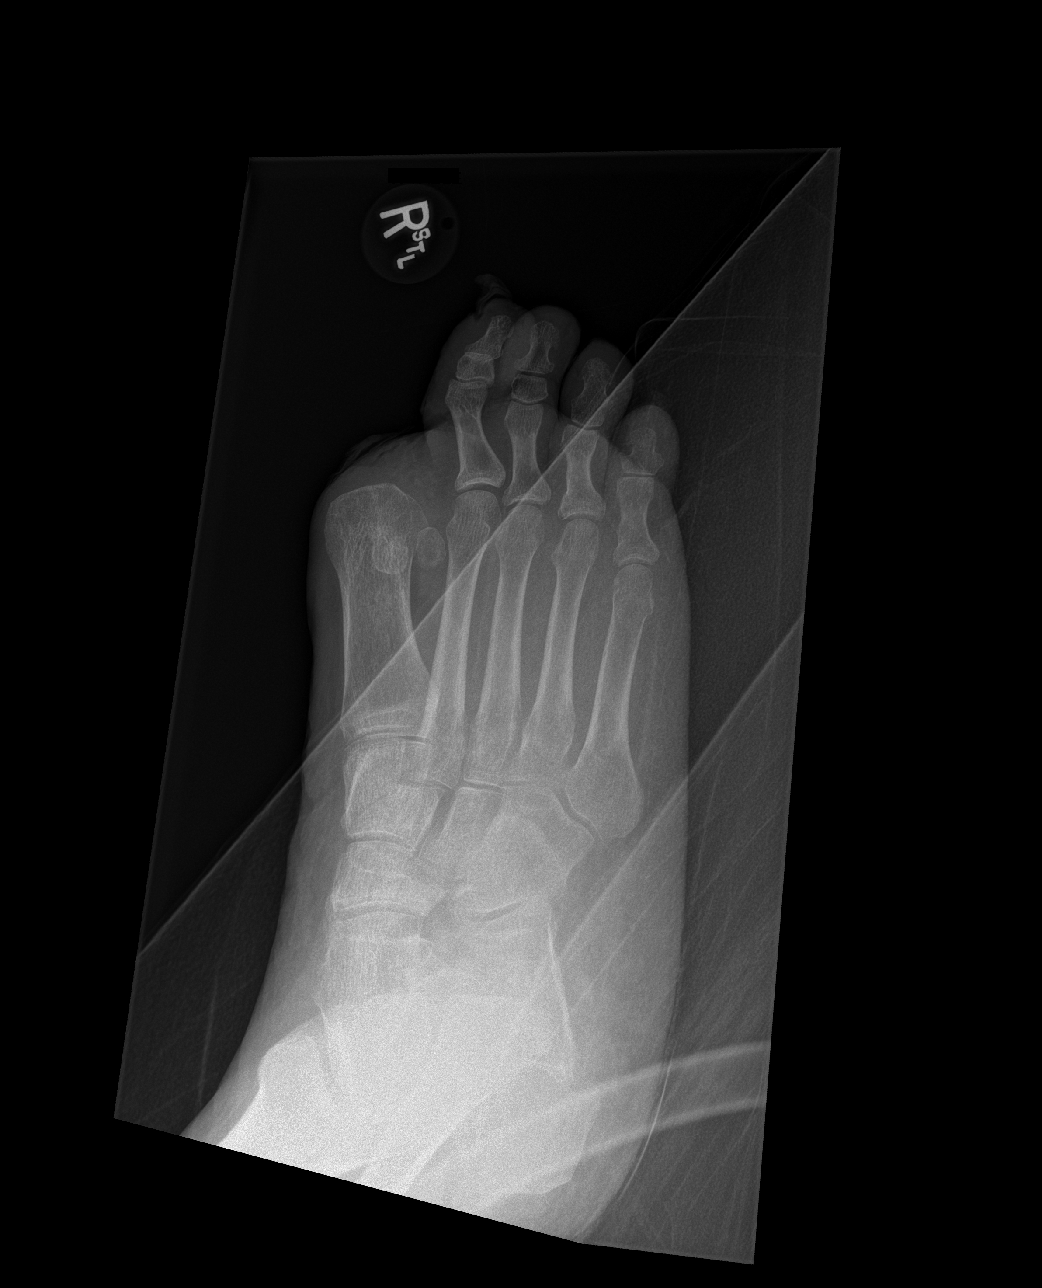

[4 of 4 positions shown; findings below may reference images not displayed]

FINDINGS: Postsurgical changes are again seen of amputation of the great toe
phalanges.

Moderate pes planus. Moderate dorsal talonavicular and
navicular-cuneiform degenerative osteophytes. Small plantar
calcaneal heel spur. Moderate forefoot soft tissue swelling is
similar to prior.

No acute fracture is seen. No cortical erosion. No radiopaque
foreign body or subcutaneous air.
IMPRESSION: :
IMPRESSION: 1. Redemonstration of amputation of the great toe phalanges.
2. Moderate pes planus.
3. Moderate dorsal forefoot soft tissue swelling.
4. No radiographic evidence of acute osteomyelitis.

## 2022-03-23 MED ORDER — TIOTROPIUM BROMIDE MONOHYDRATE 18 MCG IN CAPS: 18.0000 ug | ORAL_CAPSULE | Freq: Every day | RESPIRATORY_TRACT | Status: DC

## 2022-03-23 MED ORDER — OXYCODONE HCL 5 MG PO TABS
5.0000 mg | ORAL_TABLET | ORAL | Status: DC | PRN
  Administered 2022-03-23 – 2022-03-28 (×12): 5 mg via ORAL
  Filled 2022-03-23 (×15): qty 1

## 2022-03-23 MED ORDER — ENOXAPARIN SODIUM 40 MG/0.4ML IJ SOSY
40.0000 mg | PREFILLED_SYRINGE | INTRAMUSCULAR | Status: DC
  Administered 2022-03-23 – 2022-03-29 (×7): 40 mg via SUBCUTANEOUS
  Filled 2022-03-23 (×8): qty 0.4

## 2022-03-23 MED ORDER — ALBUTEROL SULFATE (2.5 MG/3ML) 0.083% IN NEBU
2.5000 mg | INHALATION_SOLUTION | RESPIRATORY_TRACT | Status: DC | PRN
  Administered 2022-03-27: 2.5 mg via RESPIRATORY_TRACT
  Filled 2022-03-23: qty 3

## 2022-03-23 MED ORDER — OLANZAPINE 5 MG PO TABS
5.0000 mg | ORAL_TABLET | Freq: Every day | ORAL | Status: DC
  Administered 2022-03-23 – 2022-03-25 (×3): 5 mg via ORAL
  Filled 2022-03-23 (×3): qty 1

## 2022-03-23 MED ORDER — ASPIRIN 81 MG PO CHEW
81.0000 mg | CHEWABLE_TABLET | Freq: Every day | ORAL | Status: DC
  Administered 2022-03-23 – 2022-03-30 (×7): 81 mg via ORAL
  Filled 2022-03-23 (×8): qty 1

## 2022-03-23 MED ORDER — LEVETIRACETAM 500 MG PO TABS
500.0000 mg | ORAL_TABLET | Freq: Two times a day (BID) | ORAL | Status: DC
  Administered 2022-03-23 – 2022-03-30 (×14): 500 mg via ORAL
  Filled 2022-03-23 (×16): qty 1

## 2022-03-23 MED ORDER — UMECLIDINIUM BROMIDE 62.5 MCG/ACT IN AEPB
1.0000 | INHALATION_SPRAY | Freq: Every day | RESPIRATORY_TRACT | Status: DC
Start: 2022-03-24 — End: 2022-03-31
  Administered 2022-03-24 – 2022-03-31 (×7): 1 via RESPIRATORY_TRACT
  Filled 2022-03-23: qty 7

## 2022-03-23 MED ORDER — GABAPENTIN 400 MG PO CAPS
400.0000 mg | ORAL_CAPSULE | Freq: Three times a day (TID) | ORAL | Status: DC
Start: 2022-03-23 — End: 2022-03-25
  Administered 2022-03-23 – 2022-03-25 (×5): 400 mg via ORAL
  Filled 2022-03-23 (×5): qty 1

## 2022-03-23 MED ORDER — LORAZEPAM 1 MG PO TABS
1.0000 mg | ORAL_TABLET | ORAL | Status: AC | PRN
  Administered 2022-03-23 – 2022-03-24 (×2): 1 mg via ORAL
  Filled 2022-03-23 (×2): qty 1

## 2022-03-23 MED ORDER — OXYCODONE HCL 5 MG PO TABS
5.0000 mg | ORAL_TABLET | Freq: Once | ORAL | Status: AC
  Administered 2022-03-23: 5 mg via ORAL
  Filled 2022-03-23: qty 1

## 2022-03-23 MED ORDER — IPRATROPIUM-ALBUTEROL 0.5-2.5 (3) MG/3ML IN SOLN
3.0000 mL | Freq: Four times a day (QID) | RESPIRATORY_TRACT | Status: DC
  Administered 2022-03-24 – 2022-03-26 (×8): 3 mL via RESPIRATORY_TRACT
  Filled 2022-03-23 (×11): qty 3

## 2022-03-23 MED ORDER — LORAZEPAM 2 MG/ML IJ SOLN
1.0000 mg | Freq: Once | INTRAMUSCULAR | Status: DC | PRN
  Filled 2022-03-23: qty 1

## 2022-03-23 MED ORDER — ADULT MULTIVITAMIN W/MINERALS CH
1.0000 | ORAL_TABLET | Freq: Every day | ORAL | Status: DC
  Administered 2022-03-23 – 2022-03-30 (×7): 1 via ORAL
  Filled 2022-03-23 (×8): qty 1

## 2022-03-23 MED ORDER — THIAMINE HCL 100 MG/ML IJ SOLN
100.0000 mg | Freq: Every day | INTRAMUSCULAR | Status: DC
  Filled 2022-03-23: qty 2

## 2022-03-23 MED ORDER — HYDROMORPHONE HCL 1 MG/ML IJ SOLN
1.0000 mg | Freq: Once | INTRAMUSCULAR | Status: AC
  Administered 2022-03-23: 1 mg via INTRAVENOUS
  Filled 2022-03-23: qty 1

## 2022-03-23 MED ORDER — HYDROMORPHONE HCL 1 MG/ML IJ SOLN
0.5000 mg | INTRAMUSCULAR | Status: DC | PRN
  Administered 2022-03-23 – 2022-03-28 (×18): 0.5 mg via INTRAVENOUS
  Filled 2022-03-23 (×20): qty 0.5

## 2022-03-23 MED ORDER — SPIRONOLACTONE 12.5 MG HALF TABLET
12.5000 mg | ORAL_TABLET | Freq: Every day | ORAL | Status: DC
  Administered 2022-03-23 – 2022-03-30 (×8): 12.5 mg via ORAL
  Filled 2022-03-23 (×9): qty 1

## 2022-03-23 MED ORDER — POLYETHYLENE GLYCOL 3350 17 G PO PACK
17.0000 g | PACK | Freq: Every day | ORAL | Status: DC
Start: 2022-03-23 — End: 2022-03-31
  Administered 2022-03-23 – 2022-03-30 (×3): 17 g via ORAL
  Filled 2022-03-23 (×6): qty 1

## 2022-03-23 MED ORDER — PREDNISONE 20 MG PO TABS
40.0000 mg | ORAL_TABLET | Freq: Every day | ORAL | Status: AC
  Administered 2022-03-24 – 2022-03-26 (×3): 40 mg via ORAL
  Filled 2022-03-23 (×3): qty 2

## 2022-03-23 MED ORDER — LORAZEPAM 2 MG/ML IJ SOLN
1.0000 mg | INTRAMUSCULAR | Status: AC | PRN
  Administered 2022-03-25 (×3): 1 mg via INTRAVENOUS
  Filled 2022-03-23 (×2): qty 1

## 2022-03-23 MED ORDER — ATORVASTATIN CALCIUM 40 MG PO TABS
40.0000 mg | ORAL_TABLET | Freq: Every day | ORAL | Status: DC
  Administered 2022-03-23 – 2022-03-30 (×8): 40 mg via ORAL
  Filled 2022-03-23 (×8): qty 1

## 2022-03-23 MED ORDER — FUROSEMIDE 10 MG/ML IJ SOLN
40.0000 mg | Freq: Two times a day (BID) | INTRAMUSCULAR | Status: AC
  Administered 2022-03-23 – 2022-03-25 (×5): 40 mg via INTRAVENOUS
  Filled 2022-03-23 (×5): qty 4

## 2022-03-23 MED ORDER — THIAMINE HCL 100 MG PO TABS
100.0000 mg | ORAL_TABLET | Freq: Every day | ORAL | Status: DC
  Administered 2022-03-23 – 2022-03-30 (×8): 100 mg via ORAL
  Filled 2022-03-23 (×8): qty 1

## 2022-03-23 MED ORDER — FOLIC ACID 1 MG PO TABS
1.0000 mg | ORAL_TABLET | Freq: Every day | ORAL | Status: DC
  Administered 2022-03-23 – 2022-03-30 (×7): 1 mg via ORAL
  Filled 2022-03-23 (×8): qty 1

## 2022-03-23 NOTE — ED Triage Notes (Addendum)
Pt bib ems for SI, SOB and feet wounds.  Pt is homeless.  Pt making SI comments while in park today, bystander called [phone].  Pt brought to hospital for eval.  Pt c/o SOB to ems.  Pt 100% on RA upon arrival to ED.  Pt appears extremely anxious upon arrival.  ? ?CBG 149 per ems ?

## 2022-03-23 NOTE — Assessment & Plan Note (Signed)
tachypneic and tachycardia - no white count or fever.  Swollen legs bilaterally, following imaging.  Clinically doesn't appear septic. ?D dimer is elevated, follow CT PE protocol ?

## 2022-03-23 NOTE — Assessment & Plan Note (Signed)
keppra  

## 2022-03-23 NOTE — Progress Notes (Signed)
?   03/23/22 1840  ?Assess: MEWS Score  ?Temp  ?(Temp would not take Miguel Rota 03/23/22)  ?BP 135/84  ?Pulse Rate (!) 104  ?Resp (!) 24  ?Level of Consciousness Alert  ?SpO2  ?(Unable to attain at this time.  Patient is restless.)  ?O2 Device Room Air  ?Patient Activity (if Appropriate) In bed  ?Assess: MEWS Score  ?MEWS Temp 0  ?MEWS Systolic 0  ?MEWS Pulse 1  ?MEWS RR 1  ?MEWS LOC 0  ?MEWS Score 2  ?MEWS Score Color Yellow  ?Assess: if the MEWS score is Yellow or Red  ?Were vital signs taken at a resting state? Yes  ?Focused Assessment  ?(Patient just arrived from ED.)  ?MEWS guidelines implemented *See Row Information* No, previously red, continue vital signs every 4 hours  ?Treat  ?MEWS Interventions Administered prn meds/treatments;Other (Comment) ?(Dr. Lowell Guitar made aware.)  ?Pain Scale 0-10  ?Pain Score 10  ?Pain Type Acute pain  ?Pain Location Leg  ?Pain Orientation Right;Left;Lower  ?Pain Descriptors / Indicators Aching;Throbbing  ?Pain Frequency Constant  ?Pain Onset On-going  ?Patients Stated Pain Goal 3  ?Pain Intervention(s) Medication (See eMAR)  ?Multiple Pain Sites No  ?Breathing 0  ?Negative Vocalization 0  ?Facial Expression 0  ?Body Language 1  ?Consolability 1  ?PAINAD Score 2  ?Notify: Charge Nurse/RN  ?Name of Charge Nurse/RN Notified Niel Hummer RN  ?Date Charge Nurse/RN Notified 03/23/22  ?Time Charge Nurse/RN Notified 1905  ?Notify: Provider  ?Provider Name/Title Dr. Lacretia Nicks  ?Date Provider Notified 03/23/22  ?Time Provider Notified [phone]  ?Notification Type Face-to-face  ?Notification Reason Other (Comment) ?(yellow MEWS)  ?Provider response No new orders  ?Date of Provider Response 03/23/22  ?Time of Provider Response 1855  ?Notify: Rapid Response  ?Name of Rapid Response RN Notified Not applicable  ?Document  ?Patient Outcome Other (Comment) ?(Patient is stable, no distress at this time.)  ?Progress note created (see row info) Yes  ?Assess: SIRS CRITERIA  ?SIRS Temperature  0   ?SIRS Pulse 1  ?SIRS Respirations  1  ?SIRS WBC 0  ?SIRS Score Sum  2  ? ? ?

## 2022-03-23 NOTE — Progress Notes (Deleted)
Cardiology Office Note   Date:  03/23/2022   ID:  [name], DOB [birth year], MRN [id number]  PCP:  [name], NP  Cardiologist:   Rollene Rotunda, MD Referring:  ***  No chief complaint on file.     History of Present Illness: [name] is a 56 y.o. male who presents for evaluation of systolic and diastolic HF.    In 2018 with an LVEF of 35 to 40%.  Unfortunately repeat echocardiogram in 03/2019 showed an LVEF of 15%.  09/26/2020 showed an LVEF of 40 to 45%.  Echo 01/29/2022 EF was 20%.  He underwent a heart catheterization at [facility] 10/2019 that showed diffuse atherosclerosis in all coronary distributions with the worst lesion 60 to 70% in the proximal circumflex.  Degree of cardiomyopathy felt greater than the degree of CAD suggesting a nonischemic etiology.  He underwent coronary CTA 03/2020 which revealed moderate to severe RCA, LAD, and circumflex disease.  He was seen by cardiology during the hospitalization in January 2022 when he was found down, initially called as a code stroke, confirmed on head imaging presumed cardioembolic.  We last saw him earlier this month for acute on chronic HF.  His course is complicated by medical non compliance, homelessness and substance abuse.  He has been in the ED 3 times in the past 7 days with foot and leg pain he has chronic wounds on his legs. Yesterday in the ED he had suicidal ideation.    ***      Echocardiogram , up from 15% 03/2019, mild concentric LVH, grade 2 diastolic dysfunction, no regional wall motion abnormality, moderate LAE, mild RAE, and trivial MR.  He has reported anaphylaxis to NSAIDs, aspirin was avoided and Plavix was started given the extent of his CAD.  He was continued on statin.  Goal-directed medical therapy for his heart failure included hydralazine and Imdur.  Beta-blocker not started due to cocaine history.  ACE I/ARB/ARNI/Aldactone not started due to labile renal function and lack of follow-up.    He has apparently been homeless since 2013.  He has a history of multiple ER visits for suicidal ideation with a schizoaffective disorder.  He also has documented history of refusing medications and evaluation by psychiatry.    Marland Kitchen  He was discharged on aspirin and Lipitor.  He was also discharged on carvedilol, Entresto, Imdur, hydralazine, Lasix, and spironolactone.   Since that time he has had multiple ER visits.  He presented to [facility] ED 01/29/2022 with headache, slurred speech, and hypertensive urgency.  MRI brain was negative for acute stroke.  He was also treated for COPD exacerbation and has been tapering off steroids.  Wheezing felt likely due to CHF.   Echocardiogram repeated 01/30/2022 and showed LVEF less than 20%, mild LVH, grade 3 diastolic dysfunction, elevated LVEDP, RV systolic pressure 47.7 mmHg, biatrial enlargement, and mild to moderate MR.    Cardiology was consulted for management of his heart failure.  He has been verbally abusive to staff and has refused medications and testing.  Psych, dietary was consulted and deemed the patient has capacity to refuse medication and treatment.  He was placed on a CIWA protocol and has refused Librium.  He has been counseled to stop cocaine, alcohol and cigarette smoking.  Ortho has been consulted for chronic lower extremity venous stasis ulcers present on admission.  Patient has refused social work placement after discharge.   BNP 1100 AKI has resolved 1.69 --> 1.15   He  was diuresed with 20 mg and then 40 mg IV lasix and has now been switched to 20 mg PO lasix. He is overall net negative 10 L with 5.7 cc urine output yesterday. Weight is actually up from discharge.    Currently on 325 mg ASA, 40 mg lipitor, 20-37.5 mg bidil, and 12.5 mg spironolactone.    Clonidine discontinued.    CTA and venogram for headache negative for large vessel occlusion. 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   During my exam, he would not answer questions  regarding symptoms. He complained of not being able to get medications and no one explaining what medications he was on and what they were for - I explained all cardiac medications and tried to discuss how we can overcome barriers to getting his medications. He informed me he had a SW and dismissed me from the room.      Past Medical History:  Diagnosis Date   Asthma    Bipolar 1 disorder ([place])    CHF (congestive heart failure) (HCC)    COPD (chronic obstructive pulmonary disease) ([place])    Depression    Diabetes mellitus without complication (HCC)    GERD (gastroesophageal reflux disease)    Homelessness    Hypertension    QT prolongation    Schizoaffective disorder [facility])     Past Surgical History:  Procedure Laterality Date   AMPUTATION TOE Bilateral 01/02/2021   Procedure: AMPUTATION TOE;  Surgeon: Evelina Bucy, DPM;  Location: [place];  Service: Podiatry;  Laterality: Bilateral;  Ok to leave patient on hospital bed   IR ANGIO INTRA EXTRACRAN SEL COM CAROTID INNOMINATE BILAT MOD SED  12/17/2020   IR ANGIO VERTEBRAL SEL VERTEBRAL BILAT MOD SED  12/17/2020   RADIOLOGY WITH ANESTHESIA N/A 12/17/2020   Procedure: IR WITH ANESTHESIA;  Surgeon: Radiologist, Medication, MD;  Location: [place];  Service: Radiology;  Laterality: N/A;     Current Outpatient Medications  Medication Sig Dispense Refill   albuterol (VENTOLIN HFA) 108 (90 Base) MCG/ACT inhaler Inhale 2 puffs into the lungs every 6 (six) hours as needed for wheezing or shortness of breath. 18 g 2   atorvastatin (LIPITOR) 10 MG tablet Take 4 tablets (40 mg total) by mouth daily. 120 tablet 0   carvedilol (COREG) 3.125 MG tablet Take 1 tablet (3.125 mg total) by mouth 2 (two) times daily with a meal. 60 tablet 3   folic acid (FOLVITE) 1 MG tablet Take 1 tablet (1 mg total) by mouth daily. 30 tablet 3   furosemide (LASIX) 40 MG tablet Take 1 tablet (40 mg total) by mouth 2 (two) times daily. 60 tablet 11   gabapentin (NEURONTIN) 400  MG capsule Take 1 capsule (400 mg total) by mouth 3 (three) times daily. 120 capsule 3   hydrALAZINE (APRESOLINE) 25 MG tablet Take 1 tablet (25 mg total) by mouth 3 (three) times daily. 90 tablet 3   isosorbide mononitrate (IMDUR) 60 MG 24 hr tablet Take 1 tablet (60 mg total) by mouth daily. 30 tablet 3   levETIRAcetam (KEPPRA) 500 MG tablet Take 1 tablet (500 mg total) by mouth 2 (two) times daily. 60 tablet 0   OLANZapine (ZYPREXA) 5 MG tablet Take 1 tablet (5 mg total) by mouth daily. 30 tablet 2   oxyCODONE (OXY IR/ROXICODONE) 5 MG immediate release tablet Take 1-2 tablets (5-10 mg total) by mouth every 6 (six) hours as needed for severe pain or breakthrough pain. 20 tablet 0  spironolactone (ALDACTONE) 25 MG tablet Take 1/2 tablet (12.5 mg total) by mouth daily. 30 tablet 3   thiamine 100 MG tablet Take 1 tablet (100 mg total) by mouth daily. 30 tablet 2   tiotropium (SPIRIVA HANDIHALER) 18 MCG inhalation capsule Place 1 capsule (18 mcg total) into inhaler and inhale daily as directed. 30 capsule 2   valsartan (DIOVAN) 40 MG tablet Take 1/2 tablet (20 mg total) by mouth 2 (two) times daily. 30 tablet 11   No current facility-administered medications for this visit.    Allergies:   Ibuprofen, Acetaminophen, Lyrica cr [pregabalin er], and Peanut-containing drug products    Social History:  The patient  reports that he has been smoking cigarettes. He has never used smokeless tobacco. He reports current alcohol use. He reports current drug use. Drugs: Cocaine, Marijuana, and "Crack" cocaine.   Family History:  The patient's ***family history includes Drug abuse in his mother; Suicidality in his mother.    ROS:  Please see the history of present illness.   Otherwise, review of systems are positive for {NONE DEFAULTED:18576}.   All other systems are reviewed and negative.    PHYSICAL EXAM: VS:  There were no vitals taken for this visit. , BMI There is no height or weight on file to  calculate BMI. GENERAL:  Well appearing HEENT:  Pupils equal round and reactive, fundi not visualized, oral mucosa unremarkable NECK:  No jugular venous distention, waveform within normal limits, carotid upstroke brisk and symmetric, no bruits, no thyromegaly LYMPHATICS:  No cervical, inguinal adenopathy LUNGS:  Clear to auscultation bilaterally BACK:  No CVA tenderness CHEST:  Unremarkable HEART:  PMI not displaced or sustained,S1 and S2 within normal limits, no S3, no S4, no clicks, no rubs, *** murmurs ABD:  Flat, positive bowel sounds normal in frequency in pitch, no bruits, no rebound, no guarding, no midline pulsatile mass, no hepatomegaly, no splenomegaly EXT:  2 plus pulses throughout, no edema, no cyanosis no clubbing SKIN:  No rashes no nodules NEURO:  Cranial nerves II through XII grossly intact, motor grossly intact throughout PSYCH:  Cognitively intact, oriented to person place and time    EKG:  EKG {ACTION; IS/IS [id number] ordered today. The ekg ordered today demonstrates ***   Recent Labs: 03/03/2022: Magnesium 1.7 03/20/2022: B Natriuretic Peptide 1,636.2 03/23/2022: ALT 21; BUN 19; Creatinine, Ser 1.14; Hemoglobin 14.2; Platelets 520; Potassium 4.1; Sodium 139    Lipid Panel    Component Value Date/Time   CHOL 109 03/04/2022 0335   TRIG 46 03/04/2022 0335   HDL 55 03/04/2022 0335   CHOLHDL 2.0 03/04/2022 0335   VLDL 9 03/04/2022 0335   LDLCALC 45 03/04/2022 0335      Wt Readings from Last 3 Encounters:  03/23/22 207 lb 3.7 oz (94 kg)  03/20/22 207 lb 7.3 oz (94.1 kg)  03/05/22 207 lb 7.3 oz (94.1 kg)      Other studies Reviewed: Additional studies/ records that were reviewed today include: ***. Review of the above records demonstrates:  Please see elsewhere in the note.  ***   ASSESSMENT AND PLAN:  CAD: ***  ACUTE ON CHRONIC SYSTOLIC HF:  ***  CKD IIIA:  ***     Current medicines are reviewed at length with the patient today.  The patient  {ACTIONS; HAS/DOES NOT HAVE:19233} concerns regarding medicines.  The following changes have been made:  {PLAN; NO CHANGE:13088:s}  Labs/ tests ordered today include: *** No orders of the defined types were placed  in this encounter.    Disposition:   FU with ***    Signed, Minus Breeding, MD  03/23/2022 4:18 PM    [facility]

## 2022-03-23 NOTE — ED Notes (Signed)
Pt requesting personal pants be cut off vs taking pt's pants off d/t pain in pt's feet (bilateral foot wounds) ?

## 2022-03-23 NOTE — ED Notes (Signed)
Pt refusing to be touched, and refuses O2 monitor to be connected. He states that he will only let these things be done if he talks to the doctor. ?

## 2022-03-23 NOTE — Assessment & Plan Note (Addendum)
improved ?Wheezing improved ?Will try short steroid course as suspect this represents mild exacerbation ? ?

## 2022-03-23 NOTE — Assessment & Plan Note (Addendum)
Bilateral LE edema with extensive LE wounds worse on R ?Pain seems out of proportion to exam ?Sed rate 63, CRP pending ?Plain films negative for osteo.  He had CT scans for his bilateral LE's back in December which showed cellulitis vs lymphedema. ?MRI R foot with mildly heterogenous signal of proximal phalanx of 2nd digit and proximal phalanx of 5th digit, nonspecific.  Skin irregularity about dorsal/lateral aspect of 5th digit without evidence of adjacent drainable fluid collection or abscess. ?MRI R ankle without suspicion for osteo. ?Pending L foot and ankle MRI ?Discussed with Dr. Lajoyce Corners who felt legs looked consistent with venous swelling and recommended compression stockings (not sure he'll tolerate placement of this with pain) and outpatient follow up with him ?Treating with diuretics and abx for possibility of superimposed infection ?Increase gabapentin.  Allergies listed to ibuprofen and APAP.  Will continue oxycodone and prn dilaudid for now. ?

## 2022-03-23 NOTE — Assessment & Plan Note (Signed)
Repeat EKG

## 2022-03-23 NOTE — ED Provider Notes (Signed)
?[facility] DEPT ?Provider Note ? ? ?CSN: [id number] ?Arrival date & time: 03/23/22  1319 ? ?  ? ?History ? ?Chief Complaint  ?Patient presents with  ? Suicidal  ? Shortness of Breath  ? Anxiety  ? ? ?[name] is a 56 y.o. male. ? ? ?Shortness of Breath ?Anxiety ?Associated symptoms include shortness of breath.  ?Patient presents with pain in his legs.  Also shortness of breath.  Chronic wounds on his legs.  States nothing is getting done and they hurt.  States he saw a pain medicine doctor on Wednesday and they did not do anything.  States that they just checked the wounds.  Denies fevers.  States it is hurting more. ?Also some shortness of breath.  Recently seen for same.  No cough. ?Also states he is suicidal.  States he wants to die.  States it hurts too much. ?  ?Past Medical History:  ?Diagnosis Date  ? Asthma   ? Bipolar 1 disorder ([place])   ? CHF (congestive heart failure) ([place])   ? COPD (chronic obstructive pulmonary disease) ([place])   ? Depression   ? Diabetes mellitus without complication ([place])   ? GERD (gastroesophageal reflux disease)   ? Homelessness   ? Hypertension   ? QT prolongation   ? Schizoaffective disorder ([place])   ? ? ?Home Medications ?Prior to Admission medications   ?Medication Sig Start Date End Date Taking? Authorizing Provider  ?albuterol (VENTOLIN HFA) 108 (90 Base) MCG/ACT inhaler Inhale 2 puffs into the lungs every 6 (six) hours as needed for wheezing or shortness of breath. 03/07/22   [name], MD  ?atorvastatin (LIPITOR) 10 MG tablet Take 4 tablets (40 mg total) by mouth daily. 03/07/22   [name], MD  ?carvedilol (COREG) 3.125 MG tablet Take 1 tablet (3.125 mg total) by mouth 2 (two) times daily with a meal. 03/07/22   [name], MD  ?folic acid (FOLVITE) 1 MG tablet Take 1 tablet (1 mg total) by mouth daily. 03/08/22   [name], MD  ?furosemide (LASIX) 40 MG tablet Take 1 tablet (40 mg total) by mouth 2 (two) times daily. 03/07/22 03/07/23   [name], MD  ?gabapentin (NEURONTIN) 400 MG capsule Take 1 capsule (400 mg total) by mouth 3 (three) times daily. 03/07/22   [name], MD  ?hydrALAZINE (APRESOLINE) 25 MG tablet Take 1 tablet (25 mg total) by mouth 3 (three) times daily. 03/07/22 07/05/22  [name], MD  ?isosorbide mononitrate (IMDUR) 60 MG 24 hr tablet Take 1 tablet (60 mg total) by mouth daily. 03/07/22 07/05/22  [name], MD  ?levETIRAcetam (KEPPRA) 500 MG tablet Take 1 tablet (500 mg total) by mouth 2 (two) times daily. 03/07/22 04/06/22  [name], MD  ?OLANZapine (ZYPREXA) 5 MG tablet Take 1 tablet (5 mg total) by mouth daily. 03/07/22   [name], MD  ?oxyCODONE (OXY IR/ROXICODONE) 5 MG immediate release tablet Take 1-2 tablets (5-10 mg total) by mouth every 6 (six) hours as needed for severe pain or breakthrough pain. 03/07/22   [name], MD  ?spironolactone (ALDACTONE) 25 MG tablet Take 1/2 tablet (12.5 mg total) by mouth daily. 03/08/22   [name], MD  ?thiamine 100 MG tablet Take 1 tablet (100 mg total) by mouth daily. 03/08/22   [name], MD  ?tiotropium (SPIRIVA HANDIHALER) 18 MCG inhalation capsule Place 1 capsule (18 mcg total) into inhaler and inhale  daily as directed. 03/07/22 06/05/22  [name], MD  ?valsartan (DIOVAN) 40 MG tablet Take 1/2 tablet (20 mg total) by mouth 2 (two) times daily. 03/07/22 03/07/23  [name], MD  ?   ? ?Allergies    ?Ibuprofen, Acetaminophen, Lyrica cr [pregabalin er], and Peanut-containing drug products   ? ?Review of Systems   ?Review of Systems  ?Respiratory:  Positive for shortness of breath.   ? ?Physical Exam ?Updated Vital Signs ?BP (!) 133/108   Pulse (!) 116   Temp 98.4 ?F (36.9 ?C) (Oral)   Resp (!) 27   Ht 5\' 8"  (1.727 m)   Wt 94 kg   SpO2 100%   BMI 31.51 kg/m?  ?Physical Exam ?Vitals and nursing note reviewed.  ?Cardiovascular:  ?   Rate and Rhythm: Tachycardia present.  ?Pulmonary:  ?   Breath sounds: No wheezing, rhonchi or rales.  ?Chest:  ?   Chest wall: No  tenderness.  ?Musculoskeletal:  ?   Right lower leg: Tenderness present. Edema present.  ?   Left lower leg: Tenderness present. Edema present.  ?Skin: ?   General: Skin is warm.  ?Neurological:  ?   Mental Status: He is alert and oriented to person, place, and time.  ? ? ? ? ? ? ? ? ? ?ED Results / Procedures / Treatments   ?Labs ?(all labs ordered are listed, but only abnormal results are displayed) ?Labs Reviewed  ?COMPREHENSIVE METABOLIC PANEL - Abnormal; Notable for the following components:  ?    Result Value  ? Albumin 3.4 (*)   ? All other components within normal limits  ?CBC WITH DIFFERENTIAL/PLATELET - Abnormal; Notable for the following components:  ? Platelets 520 (*)   ? All other components within normal limits  ?SEDIMENTATION RATE - Abnormal; Notable for the following components:  ? Sed Rate 63 (*)   ? All other components within normal limits  ?ETHANOL  ?RAPID URINE DRUG SCREEN, HOSP PERFORMED  ?C-REACTIVE PROTEIN  ? ? ?EKG ?None ? ?Radiology ?DG Chest 2 View ? ?Result Date: 03/23/2022 ?CLINICAL DATA:  Shortness of breath. EXAM: CHEST - 2 VIEW COMPARISON:  AP chest 03/20/2022 CT chest 03/02/2021 FINDINGS: Cardiac silhouette is again moderately to markedly enlarged. Mediastinal contours are within normal limits with calcification again seen within aortic arch. The lungs are clear. No pleural effusion or pneumothorax. Mild multilevel degenerative disc changes of the thoracic spine. IMPRESSION: Unchanged cardiomegaly without acute lung process. Electronically Signed   By: Yvonne [name] M.D.   On: 03/23/2022 15:43  ? ?DG Foot Complete Left ? ?Result Date: 03/23/2022 ?CLINICAL DATA:  Shortness of breath. Foot wounds. Homeless. Diabetes. 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 Mild-to-moderate forefoot soft tissue swelling is similar to prior. Mild plantar calcaneal heel spur. Moderate pes planus.  Moderate dorsal talonavicular navicular-cuneiform degenerative osteophytes. No acute fracture or dislocation. No cortical erosion is seen. No radiopaque foreign body or subcutaneous air. IMPRESSION: No significant change from prior. 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 Moderate pes planus. Mild-to-moderate forefoot soft tissue swelling. No radiographic evidence of acute osteomyelitis. Electronically Signed   By: Yvonne [name] M.D.   On: 03/23/2022 15:40  ? ?DG Foot Complete Right ? ?Result Date: 03/23/2022 ?CLINICAL DATA:  Foot wounds. Diabetes. Homeless. Evaluate for infection. EXAM: RIGHT FOOT COMPLETE - 3+ VIEW COMPARISON:  Right foot radiographs 02/13/2022 FINDINGS:  Postsurgical changes are again seen of amputation of the great toe phalanges. Moderate pes planus. Moderate dorsal talonavicular and navicular-cuneiform degenerative osteophytes. Small plantar calcaneal heel spur. Moderate forefoot soft tissue swelling is similar to prior. No acute fracture is seen. No cortical erosion. No radiopaque foreign body or subcutaneous air. IMPRESSION:: IMPRESSION: 1. 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 2. Moderate pes planus. 3. Moderate dorsal forefoot soft tissue swelling. 4. No radiographic evidence of acute osteomyelitis. Electronically Signed   By: Yvonne [name] M.D.   On: 03/23/2022 15:42   ? ?Procedures ?Procedures  ? ? ?Medications Ordered in ED ?Medications  ?oxyCODONE (Oxy IR/ROXICODONE) immediate release tablet 5 mg (5 mg Oral Given 03/23/22 1516)  ? ? ?ED Course/ Medical Decision Making/ A&P ?  ?                        ?Medical Decision Making ?Amount and/or Complexity of Data Reviewed ?Labs: ordered. ?Radiology: ordered. ? ?Risk ?Prescription drug management. ?Decision regarding hospitalization. ? ?Patient presents with few different complaints.  Shortness of breath leg pain.  His primary complaint is leg pain.  States it is getting worse.  States wound care  has not been doing anything.  States there is more pain and more drainage.  Denies fevers.  Uncontrolled pain medicine has been on.  Poorly controlled to the point that he says he is suicidal now.  States he does

## 2022-03-23 NOTE — Consult Note (Signed)
[facility] Face-to-Face Psychiatry Consult  ? ?Reason for Consult:  Psychiatry evaluation ?Referring Physician:  ER Physician ?Patient Identification: [name] ?MRN:  [id number] ?Principal Diagnosis: Major depressive disorder, recurrent episode ([place]) ?Diagnosis:  Principal Problem: ?  Major depressive disorder, recurrent episode ([place]) ?Active Problems: ?  Swelling of lower extremity ? ? ?Total Time spent with patient: 20 minutes ? ?Subjective:   ?[name] is a 56 y.o. male patient admitted with Depression, hx  Schizoaffective disorder,of bipolar, schizophrenia and cocaine abuse brought to the ER for medical issues with suicidal ideation. ? ?HPI:  AA male, 56 years old brought in by EMS for SOB, SI and feet wound.  Patient is homeless and frequents the ER for Medical care.  He was seen before this provider came in the conduct Psych evaluation.  Patient was informed of his pending admission to the Medical unit when this write came in to see him.  He admitted he is suicidal but stated" I don't want to say much and put myself in trouble"  He reported that he is about to loose both of his legs, his blood sugar is not controlled and he is constantly in pain.  He reported that he is suicidal and that his mother committed suicide that ended her life.  Patient did not want to continue the interview but asked provider to come back because he is in emotional and physical pain.  Patient also stated that this time is not the right time for him to be seen by Psych provider because his main issue now is pain and possibility of loosing his legs. ?Psychiatry will follow patient while he is in the Medical unit. ? ?Past Psychiatric History: Per Patient and old records-Depression, Schizoaffective disorder,hx of bipolar, schizophrenia and cocaine abuse. Pt reports several hospitalizations , more than he is able to count and has had several suicide attempts .  ? ?Risk to Self:   ?Risk to Others:   ?Prior Inpatient Therapy:   ?Prior  Outpatient Therapy:   ? ?Past Medical History:  ?Past Medical History:  ?Diagnosis Date  ? Asthma   ? Bipolar 1 disorder ([place])   ? CHF (congestive heart failure) ([place])   ? COPD (chronic obstructive pulmonary disease) ([place])   ? Depression   ? Diabetes mellitus without complication ([place])   ? GERD (gastroesophageal reflux disease)   ? Homelessness   ? Hypertension   ? QT prolongation   ? Schizoaffective disorder ([place])   ?  ?Past Surgical History:  ?Procedure Laterality Date  ? 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 Procedure: AMPUTATION TOE;  Surgeon: Evelina Bucy, DPM;  Location: [place];  Service: Podiatry;  Laterality: Bilateral;  Ok to leave patient on hospital bed  ? IR ANGIO INTRA EXTRACRAN SEL COM CAROTID INNOMINATE BILAT MOD SED  12/17/2020  ? IR ANGIO VERTEBRAL SEL VERTEBRAL BILAT MOD SED  12/17/2020  ? RADIOLOGY WITH ANESTHESIA N/A 12/17/2020  ? Procedure: IR WITH ANESTHESIA;  Surgeon: Radiologist, Medication, MD;  Location: [name];  Service: Radiology;  Laterality: N/A;  ? ?Family History:  ?Family History  ?Problem Relation Age of Onset  ? Drug abuse Mother   ? Suicidality Mother   ? ?Family Psychiatric  History: reported mom committed suicide and died but did not say what she did. ?Social History:  ?Social History  ? ?Substance and Sexual Activity  ?Alcohol Use Yes  ? Comment: infrequently  ?   ?Social History  ? ?Substance and Sexual Activity  ?Drug Use Yes  ?  Types: Cocaine, Marijuana, "Crack" cocaine  ?  ?Social History  ? ?Socioeconomic History  ? Marital status: Single  ?  Spouse name: Not on file  ? Number of children: 4  ? Years of education: Not on file  ? Highest education level: Not on file  ?Occupational History  ? Occupation: N/A  ?Tobacco Use  ? Smoking status: Every Day  ?  Years: 30.00  ?  Types: Cigarettes  ? Smokeless tobacco: Never  ?Vaping Use  ? Vaping Use: Never used  ?Substance and Sexual Activity  ? Alcohol use: Yes  ?  Comment: infrequently  ? Drug use: Yes  ?  Types: Cocaine,  Marijuana, "Crack" cocaine  ? Sexual activity: Not Currently  ?  Partners: Female  ?  Birth control/protection: None  ?Other Topics Concern  ? Not on file  ?Social History Narrative  ? ** Merged History Encounter **  ?    ? ?Social Determinants of Health  ? ?Financial Resource Strain: Not on file  ?Food Insecurity: Not on file  ?Transportation Needs: Not on file  ?Physical Activity: Not on file  ?Stress: Not on file  ?Social Connections: Not on file  ? ?Additional Social History: ?  ? ?Allergies:   ?Allergies  ?Allergen Reactions  ? Ibuprofen Anaphylaxis  ? Acetaminophen Hives, Nausea And Vomiting and Rash  ?  Pt overdosed on bottle of APAP 05/06/16 ?  ? Lyrica Cr [Pregabalin Er] Other (See Comments)  ?  Dystonia and dysarthria  ? Peanut-Containing Drug Products Other (See Comments)  ?  Pt denies allergy to peanut butter 03/02/2021  ? ? ?Labs:  ?Results for orders placed or performed during the hospital encounter of 03/23/22 (from the past 48 hour(s))  ?Comprehensive metabolic panel     Status: Abnormal  ? Collection Time: 03/23/22  2:50 PM  ?Result Value Ref Range  ? Sodium 139 135 - 145 mmol/L  ? Potassium 4.1 3.5 - 5.1 mmol/L  ? Chloride 103 98 - 111 mmol/L  ? CO2 26 22 - 32 mmol/L  ? Glucose, Bld 84 70 - 99 mg/dL  ?  Comment: Glucose reference range applies only to samples taken after fasting for at least 8 hours.  ? BUN 19 6 - 20 mg/dL  ? Creatinine, Ser 1.14 0.61 - 1.24 mg/dL  ? Calcium 9.0 8.9 - 10.3 mg/dL  ? Total Protein 7.6 6.5 - 8.1 g/dL  ? Albumin 3.4 (L) 3.5 - 5.0 g/dL  ? AST 26 15 - 41 U/L  ? ALT 21 0 - 44 U/L  ? Alkaline Phosphatase 125 38 - 126 U/L  ? Total Bilirubin 1.0 0.3 - 1.2 mg/dL  ? GFR, Estimated >60 >60 mL/min  ?  Comment: (NOTE) ?Calculated using the CKD-EPI Creatinine Equation (2021) ?  ? Anion gap 10 5 - 15  ?  Comment: Performed at [facility], [place] [address], [place], [place] [address]  ?Ethanol     Status: None  ? Collection Time: 03/23/22  2:50 PM  ?Result Value Ref  Range  ? Alcohol, Ethyl (B) <10 <10 mg/dL  ?  Comment: (NOTE) ?Lowest detectable limit for serum alcohol is 10 mg/dL. ? ?For medical purposes only. ?Performed at [facility], [address] ?[address], [place] [address] ?  ?CBC with Differential     Status: Abnormal  ? Collection Time: 03/23/22  2:50 PM  ?Result Value Ref Range  ? WBC 7.8 4.0 - 10.5 K/uL  ? RBC 4.60 4.22 -  5.81 MIL/uL  ? Hemoglobin 14.2 13.0 - 17.0 g/dL  ? HCT 44.0 39.0 - 52.0 %  ? MCV 95.7 80.0 - 100.0 fL  ? MCH 30.9 26.0 - 34.0 pg  ? MCHC 32.3 30.0 - 36.0 g/dL  ? RDW 14.6 11.5 - 15.5 %  ? Platelets 520 (H) 150 - 400 K/uL  ? nRBC 0.0 0.0 - 0.2 %  ? Neutrophils Relative % 64 %  ? Neutro Abs 4.9 1.7 - 7.7 K/uL  ? Lymphocytes Relative 27 %  ? Lymphs Abs 2.1 0.7 - 4.0 K/uL  ? Monocytes Relative 9 %  ? Monocytes Absolute 0.7 0.1 - 1.0 K/uL  ? Eosinophils Relative 0 %  ? Eosinophils Absolute 0.0 0.0 - 0.5 K/uL  ? Basophils Relative 0 %  ? Basophils Absolute 0.0 0.0 - 0.1 K/uL  ? Immature Granulocytes 0 %  ? Abs Immature Granulocytes 0.03 0.00 - 0.07 K/uL  ?  Comment: Performed at [facility], [place] [address] [place], [place] [address]  ?Sedimentation rate     Status: Abnormal  ? Collection Time: 03/23/22  2:50 PM  ?Result Value Ref Range  ? Sed Rate 63 (H) 0 - 16 mm/hr  ?  Comment: Performed at [facility], [place] [address] [place], [place] [address]  ?D-dimer, quantitative     Status: Abnormal  ? Collection Time: 03/23/22  2:50 PM  ?Result Value Ref Range  ? D-Dimer, Quant 0.71 (H) 0.00 - 0.50 ug/mL-FEU  ?  Comment: (NOTE) ?At the manufacturer cut-off value of 0.5 ?g/mL FEU, this assay has a ?negative predictive value of 95-100%.This assay is intended for use ?in conjunction with a clinical pretest probability (PTP) assessment ?model to exclude pulmonary embolism (PE) and deep venous thrombosis ?(DVT) in outpatients suspected of PE or DVT. ?Results should be correlated with clinical  presentation. ?Performed at [facility], [address] ?[place], [place] [address] ?  ? ? ?Current Facility-Administered Medications  ?Medication Dose Route Frequency Provider Last Rate Last Admin  ? fur

## 2022-03-23 NOTE — Progress Notes (Signed)
Received patient from ED via stretcher, assisted in transfer to bed in position of comfort.  Noted with multiple open wounds on BLE, right greater than left.  Also noted with left and right great toes amputated.  Patient states, "you can do anything you need to do, just do not touch my legs".  Oriented to room and unit routines, needs addressed. ?

## 2022-03-23 NOTE — Assessment & Plan Note (Addendum)
Elevated BNP ?LE swelling ?CXR with cardiomegaly ?HF exacerbation ?Lasix BID, spironolactone, hydralazine, imdur.  No entresto per cards given nonadherence. ?Will hold coreg with recent cocaine use (cards recommending resuming coreg once euvolemic) ?Strict I/O, daily weigths ?Echo 01/2022 with EF <20%, grade III diastolic dysfunction, mildly reduced RVSF ?Will consult cards - recommending diuretics and transition to oral ?

## 2022-03-23 NOTE — H&P (Signed)
?History and Physical  ? ? ?[name] MAU:[id number] DOB: 05/10/1966 DOA: 03/23/2022 ? ?PCP: [name], NP  ?Patient coming from: homeless ? ?I have personally briefly reviewed patient's old medical records in [facility] ? ?Chief Complaint: lower extremity pain ? ?HPI: [name] is [name] 56 y.o. male with medical history significant of stroke with residual dysarthria/right sided weakness (wheelchair bound), HFrEF (EF <20%), CAD, T2DM (diet controlled), cocaine abuse, schizoaffective disorder and multiple other medical problems presenting with bilateral lower extremity pain and swelling. ? ?He notes pain has been present for months.  Recently went to wound care.  Doesn't feel like he's making any progress.  Describes sharp shooting pain in bilateral legs at times.  Right seems worse.  In addition, he's noticed shortness of breath.  He expressed suicidal thoughts to EDP.  Notes recent cocaine use.  Says it helps with the pain.  Notes occasional drinking, says not every day.  ? ?ED Course: labs, imaging.  Admit to hospitalist.  ? ?Review of Systems: As per HPI otherwise all other systems reviewed and are negative. ? ?Past Medical History:  ?Diagnosis Date  ? Asthma   ? Bipolar 1 disorder ([facility])   ? CHF (congestive heart failure) ([facility])   ? COPD (chronic obstructive pulmonary disease) ([facility])   ? Depression   ? Diabetes mellitus without complication ([facility])   ? GERD (gastroesophageal reflux disease)   ? Homelessness   ? Hypertension   ? QT prolongation   ? Schizoaffective disorder ([facility])   ? ? ?Past Surgical History:  ?Procedure Laterality Date  ? 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 Procedure: AMPUTATION TOE;  Surgeon: Park Liter, DPM;  [facility];  Service: Podiatry;  Laterality: Bilateral;  Ok to leave patient on hospital bed  ? IR ANGIO INTRA EXTRACRAN SEL COM CAROTID INNOMINATE BILAT MOD SED  12/17/2020  ? IR ANGIO VERTEBRAL SEL VERTEBRAL BILAT MOD SED  12/17/2020  ? RADIOLOGY WITH ANESTHESIA N/Makita Blow  12/17/2020  ? Procedure: IR WITH ANESTHESIA;  Surgeon: Radiologist, Medication, MD;  Location: [facility];  Service: Radiology;  Laterality: N/Aziah Brostrom;  ? ? ?Social History ? reports that he has been smoking cigarettes. He has never used smokeless tobacco. He reports current alcohol use. He reports current drug use. Drugs: Cocaine, Marijuana, and "Crack" cocaine. ? ?Allergies  ?Allergen Reactions  ? Ibuprofen Anaphylaxis  ? Acetaminophen Hives, Nausea And Vomiting and Rash  ?  Pt overdosed on bottle of APAP 05/06/16 ?  ? Lyrica Cr [Pregabalin Er] Other (See Comments)  ?  Dystonia and dysarthria  ? Peanut-Containing Drug Products Other (See Comments)  ?  Pt denies allergy to peanut butter 03/02/2021  ? ? ?Family History  ?Problem Relation Age of Onset  ? Drug abuse Mother   ? Suicidality Mother   ? ? ? ?Prior to Admission medications   ?Medication Sig Start Date End Date Taking? Authorizing Provider  ?albuterol (VENTOLIN HFA) 108 (90 Base) MCG/ACT inhaler Inhale 2 puffs into the lungs every 6 (six) hours as needed for wheezing or shortness of breath. 03/07/22   Meredeth Ide, MD  ?atorvastatin (LIPITOR) 10 MG tablet Take 4 tablets (40 mg total) by mouth daily. 03/07/22   Meredeth Ide, MD  ?carvedilol (COREG) 3.125 MG tablet Take 1 tablet (3.125 mg total) by mouth 2 (two) times daily with Ishika Chesterfield meal. 03/07/22   Meredeth Ide, MD  ?folic acid (FOLVITE) 1 MG tablet Take 1 tablet (1 mg total) by mouth daily.  03/08/22   Meredeth Ide, MD  ?furosemide (LASIX) 40 MG tablet Take 1 tablet (40 mg total) by mouth 2 (two) times daily. 03/07/22 03/07/23  Meredeth Ide, MD  ?gabapentin (NEURONTIN) 400 MG capsule Take 1 capsule (400 mg total) by mouth 3 (three) times daily. 03/07/22   Meredeth Ide, MD  ?hydrALAZINE (APRESOLINE) 25 MG tablet Take 1 tablet (25 mg total) by mouth 3 (three) times daily. 03/07/22 07/05/22  Meredeth Ide, MD  ?isosorbide mononitrate (IMDUR) 60 MG 24 hr tablet Take 1 tablet (60 mg total) by mouth daily. 03/07/22 07/05/22  Meredeth Ide, MD   ?levETIRAcetam (KEPPRA) 500 MG tablet Take 1 tablet (500 mg total) by mouth 2 (two) times daily. 03/07/22 04/06/22  Meredeth Ide, MD  ?OLANZapine (ZYPREXA) 5 MG tablet Take 1 tablet (5 mg total) by mouth daily. 03/07/22   Meredeth Ide, MD  ?oxyCODONE (OXY IR/ROXICODONE) 5 MG immediate release tablet Take 1-2 tablets (5-10 mg total) by mouth every 6 (six) hours as needed for severe pain or breakthrough pain. 03/07/22   Meredeth Ide, MD  ?spironolactone (ALDACTONE) 25 MG tablet Take 1/2 tablet (12.5 mg total) by mouth daily. 03/08/22   Meredeth Ide, MD  ?thiamine 100 MG tablet Take 1 tablet (100 mg total) by mouth daily. 03/08/22   Meredeth Ide, MD  ?tiotropium (SPIRIVA HANDIHALER) 18 MCG inhalation capsule Place 1 capsule (18 mcg total) into inhaler and inhale daily as directed. 03/07/22 06/05/22  Meredeth Ide, MD  ?valsartan (DIOVAN) 40 MG tablet Take 1/2 tablet (20 mg total) by mouth 2 (two) times daily. 03/07/22 03/07/23  Meredeth Ide, MD  ? ? ?Physical Exam: ?Vitals:  ? 03/23/22 1600 03/23/22 1615 03/23/22 1718 03/23/22 1840  ?BP: (!) 180/132 (!) 133/108 (!) 142/93 135/84  ?Pulse:   (!) 113 (!) 104  ?Resp: (!) 21 (!) 27 (!) 24 (!) 24  ?Temp:      ?TempSrc:      ?SpO2:   95%   ?Weight:      ?Height:      ? ? ?Constitutional: NAD, calm, comfortable ?Vitals:  ? 03/23/22 1600 03/23/22 1615 03/23/22 1718 03/23/22 1840  ?BP: (!) 180/132 (!) 133/108 (!) 142/93 135/84  ?Pulse:   (!) 113 (!) 104  ?Resp: (!) 21 (!) 27 (!) 24 (!) 24  ?Temp:      ?TempSrc:      ?SpO2:   95%   ?Weight:      ?Height:      ? ?Eyes: PERRL, lids and conjunctivae normal ?ENMT: Mucous membranes are moist.  ?Neck: normal, supple ?Respiratory: scattered wheezing, tight breath sounds ?Cardiovascular: tachycardic, regular ?Abdomen: no tenderness, no masses palpated. ?Musculoskeletal: bilateral leg swelling, numerous wounds to bilateral feet and lower legs (worse on right) - pain seems out of proportion to exam, no appreciated crepitus or fluctuance ?Skin: no  rashes, lesions, ulcers. No induration ?Neurologic: dysarthria, right sided weakness (chronic) ? ?Labs on Admission: I have personally reviewed following labs and imaging studies ? ?CBC: ?Recent Labs  ?Lab 03/17/22 ?1152 03/20/22 ?1306 03/23/22 ?1450  ?WBC 8.3 7.7 7.8  ?NEUTROABS  --  4.3 4.9  ?HGB 12.6* 12.4* 14.2  ?HCT 38.3* 36.1* 44.0  ?MCV 94.3 92.8 95.7  ?PLT 286 404* 520*  ? ? ?Basic Metabolic Panel: ?Recent Labs  ?Lab 03/17/22 ?1152 03/20/22 ?1306 03/23/22 ?1450  ?NA 136 134* 139  ?K 3.9 4.4 4.1  ?CL 102 103 103  ?CO2 28  21* 26  ?GLUCOSE 80 93 84  ?BUN 8 13 19   ?CREATININE 0.92 1.15 1.14  ?CALCIUM 8.4* 8.3* 9.0  ? ? ?GFR: ?Estimated Creatinine Clearance: 83.3 mL/min (by C-G formula based on SCr of 1.14 mg/dL). ? ?Liver Function Tests: ?Recent Labs  ?Lab 03/20/22 ?1306 03/23/22 ?1450  ?AST 20 26  ?ALT 20 21  ?ALKPHOS 92 125  ?BILITOT 1.3* 1.0  ?PROT 6.2* 7.6  ?ALBUMIN 2.6* 3.4*  ? ? ?Urine analysis: ?   ?Component Value Date/Time  ? COLORURINE AMBER ([name]) 03/20/2022 1923  ? APPEARANCEUR HAZY (Kelsay Haggard) 03/20/2022 1923  ? LABSPEC 1.032 (H) 03/20/2022 1923  ? PHURINE 5.0 03/20/2022 1923  ? GLUCOSEU NEGATIVE 03/20/2022 1923  ? HGBUR NEGATIVE 03/20/2022 1923  ? BILIRUBINUR NEGATIVE 03/20/2022 1923  ? KETONESUR 5 (Louay Myrie) 03/20/2022 1923  ? PROTEINUR 100 (Nayzeth Altman) 03/20/2022 1923  ? NITRITE NEGATIVE 03/20/2022 1923  ? LEUKOCYTESUR NEGATIVE 03/20/2022 1923  ? ? ?Radiological Exams on Admission: ?DG Chest 2 View ? ?Result Date: 03/23/2022 ?CLINICAL DATA:  Shortness of breath. EXAM: CHEST - 2 VIEW COMPARISON:  AP chest 03/20/2022 CT chest 03/02/2021 FINDINGS: Cardiac silhouette is again moderately to markedly enlarged. Mediastinal contours are within normal limits with calcification again seen within aortic arch. The lungs are clear. No pleural effusion or pneumothorax. Mild multilevel degenerative disc changes of the thoracic spine. IMPRESSION: Unchanged cardiomegaly without acute lung process. Electronically Signed   By: Neita Garnet  M.D.   On: 03/23/2022 15:43  ? ?DG Foot Complete Left ? ?Result Date: 03/23/2022 ?CLINICAL DATA:  Shortness of breath. Foot wounds. Homeless. Diabetes. EXAM: LEFT FOOT - COMPLETE 3+ VIEW COMPARISON:  Left foot

## 2022-03-23 NOTE — Assessment & Plan Note (Addendum)
Notes SI ?Psychiatry recommending resume abilify (will clarify as home meds have zyprexa, but not abilify), doesn't meat criteria for involuntary commitment ?D/c suicide sitter ?

## 2022-03-23 NOTE — Assessment & Plan Note (Signed)
TOC c/s ?

## 2022-03-23 NOTE — Assessment & Plan Note (Addendum)
Residual dysarthria  ?Continue statin, should be on aspirin as well will restart this (allergy noted in chart, but he was on aspirin last year, warning I think is related to ibuprofen) ?

## 2022-03-23 NOTE — ED Notes (Addendum)
Pt's feet dressings removed per MD Rubin Payor request.  Dionne Ano., RN and this ED RN present at bedside w/ dressing removal.  Pt tolerated well.  Existing dressings saturated w/ minimal amount of normal saline to remove.  Many wounds in multiple stages of breakdown present on pt's bilateral feel/lower legs. ?

## 2022-03-23 NOTE — Assessment & Plan Note (Signed)
Encourage cessation ?Avoid beta blocker for now ?

## 2022-03-23 NOTE — ED Notes (Signed)
ED Provider at bedside. 

## 2022-03-23 NOTE — Assessment & Plan Note (Signed)
Denies regular use, though unclear how reliable he is ?CIWA ?

## 2022-03-23 NOTE — Progress Notes (Incomplete)
While pt was in MRI.Marland KitchenMarland KitchenPt squeezed the safety ball after scanning the right foot, prior to getting contrast. Pt states he is in too much pain and unable to continue with his exam. RN aware.  ?

## 2022-03-23 NOTE — Assessment & Plan Note (Signed)
Mild, likely demand ?

## 2022-03-23 NOTE — Progress Notes (Signed)
Patient MEWS yellow, Dr. Florene Glen notified verbally.  Per MD, will not escalate care, follow orders as previously placed.  MRI called for patient, prn pain med given. ?

## 2022-03-24 ENCOUNTER — Ambulatory Visit: Payer: Medicaid Other | Admitting: Cardiology

## 2022-03-24 ENCOUNTER — Inpatient Hospital Stay (HOSPITAL_COMMUNITY): Payer: Medicaid Other

## 2022-03-24 DIAGNOSIS — I5023 Acute on chronic systolic (congestive) heart failure: Secondary | ICD-10-CM | POA: Diagnosis not present

## 2022-03-24 DIAGNOSIS — M79605 Pain in left leg: Secondary | ICD-10-CM | POA: Diagnosis not present

## 2022-03-24 DIAGNOSIS — T43506A Underdosing of unspecified antipsychotics and neuroleptics, initial encounter: Secondary | ICD-10-CM

## 2022-03-24 DIAGNOSIS — Z91128 Patient's intentional underdosing of medication regimen for other reason: Secondary | ICD-10-CM

## 2022-03-24 DIAGNOSIS — R45851 Suicidal ideations: Secondary | ICD-10-CM

## 2022-03-24 DIAGNOSIS — M7989 Other specified soft tissue disorders: Secondary | ICD-10-CM | POA: Diagnosis not present

## 2022-03-24 DIAGNOSIS — M79604 Pain in right leg: Secondary | ICD-10-CM | POA: Diagnosis not present

## 2022-03-24 LAB — HEMOGLOBIN A1C
Hgb A1c MFr Bld: 7 % — ABNORMAL HIGH (ref 4.8–5.6)
Mean Plasma Glucose: 154.2 mg/dL

## 2022-03-24 LAB — CBC
HCT: 36.4 % — ABNORMAL LOW (ref 39.0–52.0)
Hemoglobin: 12 g/dL — ABNORMAL LOW (ref 13.0–17.0)
MCH: 31.2 pg (ref 26.0–34.0)
MCHC: 33 g/dL (ref 30.0–36.0)
MCV: 94.5 fL (ref 80.0–100.0)
Platelets: 433 10*3/uL — ABNORMAL HIGH (ref 150–400)
RBC: 3.85 MIL/uL — ABNORMAL LOW (ref 4.22–5.81)
RDW: 14.5 % (ref 11.5–15.5)
WBC: 8.9 10*3/uL (ref 4.0–10.5)
nRBC: 0 % (ref 0.0–0.2)

## 2022-03-24 LAB — COMPREHENSIVE METABOLIC PANEL
ALT: 17 U/L (ref 0–44)
AST: 18 U/L (ref 15–41)
Albumin: 2.7 g/dL — ABNORMAL LOW (ref 3.5–5.0)
Alkaline Phosphatase: 103 U/L (ref 38–126)
Anion gap: 10 (ref 5–15)
BUN: 21 mg/dL — ABNORMAL HIGH (ref 6–20)
CO2: 24 mmol/L (ref 22–32)
Calcium: 8.6 mg/dL — ABNORMAL LOW (ref 8.9–10.3)
Chloride: 103 mmol/L (ref 98–111)
Creatinine, Ser: 1.17 mg/dL (ref 0.61–1.24)
GFR, Estimated: >60 mL/min (ref >60)
Glucose, Bld: 103 mg/dL — ABNORMAL HIGH (ref 70–99)
Potassium: 4.6 mmol/L (ref 3.5–5.1)
Sodium: 137 mmol/L (ref 135–145)
Total Bilirubin: 1.1 mg/dL (ref 0.3–1.2)
Total Protein: 6.4 g/dL — ABNORMAL LOW (ref 6.5–8.1)

## 2022-03-24 LAB — CK: Total CK: 90 U/L (ref 49–397)

## 2022-03-24 LAB — PROCALCITONIN: Procalcitonin: 0.66 ng/mL

## 2022-03-24 MED ORDER — VANCOMYCIN HCL IN DEXTROSE 1-5 GM/200ML-% IV SOLN
1000.0000 mg | Freq: Two times a day (BID) | INTRAVENOUS | Status: DC
  Administered 2022-03-25 – 2022-03-30 (×10): 1000 mg via INTRAVENOUS
  Filled 2022-03-24 (×13): qty 200

## 2022-03-24 MED ORDER — IRBESARTAN 75 MG PO TABS: 37.5000 mg | ORAL_TABLET | Freq: Every day | ORAL | Status: DC

## 2022-03-24 MED ORDER — SODIUM CHLORIDE 0.9 % IV SOLN
2.0000 g | Freq: Every day | INTRAVENOUS | Status: DC
  Administered 2022-03-24 – 2022-03-29 (×6): 2 g via INTRAVENOUS
  Filled 2022-03-24 (×8): qty 20

## 2022-03-24 MED ORDER — HYDRALAZINE HCL 25 MG PO TABS
25.0000 mg | ORAL_TABLET | Freq: Three times a day (TID) | ORAL | Status: DC
  Administered 2022-03-24 – 2022-03-31 (×19): 25 mg via ORAL
  Filled 2022-03-24 (×25): qty 1

## 2022-03-24 MED ORDER — VANCOMYCIN HCL IN DEXTROSE 1-5 GM/200ML-% IV SOLN: 1000.0000 mg | Freq: Once | INTRAVENOUS | Status: DC

## 2022-03-24 MED ORDER — HYDROMORPHONE HCL 1 MG/ML IJ SOLN
1.0000 mg | INTRAMUSCULAR | Status: AC | PRN
  Administered 2022-03-24 (×2): 1 mg via INTRAVENOUS
  Filled 2022-03-24 (×2): qty 1

## 2022-03-24 MED ORDER — ISOSORBIDE MONONITRATE ER 60 MG PO TB24
60.0000 mg | ORAL_TABLET | Freq: Every day | ORAL | Status: DC
Start: 2022-03-24 — End: 2022-03-31
  Administered 2022-03-24 – 2022-03-30 (×7): 60 mg via ORAL
  Filled 2022-03-24 (×7): qty 1

## 2022-03-24 MED ORDER — VANCOMYCIN HCL 2000 MG/400ML IV SOLN
2000.0000 mg | Freq: Once | INTRAVENOUS | Status: AC
  Administered 2022-03-24: 2000 mg via INTRAVENOUS
  Filled 2022-03-24: qty 400

## 2022-03-24 MED ORDER — VANCOMYCIN HCL IN DEXTROSE 1-5 GM/200ML-% IV SOLN
1000.0000 mg | Freq: Two times a day (BID) | INTRAVENOUS | Status: DC
  Filled 2022-03-24: qty 200

## 2022-03-24 NOTE — Progress Notes (Signed)
This nurse observed patients BLE wounds, educated the patient on the importance of cleaning wounds and healing process. Patient states 'I do not want my wounds cleaned, don't touch them. Do your job and leave me alone." Will continue current management. Will continue to educate on the importance of wound care.  ?

## 2022-03-24 NOTE — Progress Notes (Signed)
?PROGRESS NOTE ? ? ? ?[name]  [id number] DOB: [birth year] DOA: 03/23/2022 ?PCP: Marliss Coots, NP  ?Chief Complaint  ?Patient presents with  ? Suicidal  ? Shortness of Breath  ? Anxiety  ? ? ?Brief Narrative:  ?[name] is [name] 56 y.o. male with medical history significant of stroke with residual dysarthria/right sided weakness (wheelchair bound), HFrEF (EF <20%), CAD, T2DM (diet controlled), cocaine abuse, schizoaffective disorder and multiple other medical problems presenting with bilateral lower extremity pain and swelling.  ? ? ?Assessment & Plan: ?  ?Principal Problem: ?  Bilateral lower extremity pain ?Active Problems: ?  Heart failure with reduced ejection fraction and diastolic dysfunction ([place]) ?  Swelling of lower extremity ?  SIRS (systemic inflammatory response syndrome) (HCC) ?  COPD with acute exacerbation ([place]) ?  Major depressive disorder, recurrent episode ([place]) ?  History of stroke ?  Alcohol use disorder, mild, abuse ?  Cocaine abuse ([place]) ?  Seizure ([place]) ?  Homelessness ?  Elevated troponin ?  QT prolongation ?  Acute exacerbation of CHF (congestive heart failure) ([place]) ?  Dysarthria ?  Substance use disorder ?  Leg swelling ? ? ?Assessment and Plan: ?* Bilateral lower extremity pain ?Bilateral LE edema with extensive LE wounds worse on R ?Pain seems out of proportion to exam ?Sed rate 63, CRP pending ?Plain films negative for osteo.  He had CT scans for his bilateral LE's back in December which showed cellulitis vs lymphedema. ?Will get MRI w/ contrast  ? - MRI R foot with mildly heterogenous signal of proximal phalanx of 2nd digit and proximal phalanx of 5th digit, nonspecific.  Skin irregularity about dorsal/lateral aspect of 5th digit without evidence of adjacent drainable fluid collection or abscess. ?Wound c/s ?Consider ortho/podiatry pending further imaging ? ? ?Heart failure with reduced ejection fraction and diastolic dysfunction ([place]) ?Elevated BNP ?LE swelling ?CXR with  cardiomegaly ?HF exacerbation ?Lasix BID, spironolactone.  Will see how he does with those before starting imdur, hydral, and valsartan (notes adherence, but unclear if he's reliable) ?Will hold coreg with recent cocaine use ?Strict I/O, daily weigths ?Echo 01/2022 with EF <20%, grade III diastolic dysfunction, mildly reduced RVSF ?Will consult cards - recommending diuretics and transition to oral ? ?SIRS (systemic inflammatory response syndrome) (HCC) ?tachypneic and tachycardia - no white count or fever.  Swollen legs bilaterally, following imaging.  Clinically doesn't appear septic. ?D dimer is elevated, follow CT [name] protocol ? ?COPD with acute exacerbation ([place]) ?Some wheezing on exam, will schedule nebs and have prn albuterol available ?Will try short steroid course as suspect this represents mild exacerbation ? ? ?Major depressive disorder, recurrent episode ([place]) ?Notes SI ?Psychiatry recommending resume abilify, doesn't meat criteria for involuntary commitment ?D/c suicide sitter ? ?History of stroke ?Residual dysarthria  ?Continue statin, should be on aspirin as well will restart this (allergy noted in chart, but he was on aspirin last year, warning I think is related to ibuprofen) ? ?Cocaine abuse ([place]) ?Encourage cessation ?Avoid beta blocker for now ? ?Alcohol use disorder, mild, abuse ?Denies regular use, though unclear how reliable he is ?CIWA ? ?Seizure ([place]) ?keppra ? ?Homelessness ?TOC c/s ? ?Elevated troponin ?Mild, likely demand ? ?QT prolongation ?Repeat EKG ? ? ?DVT prophylaxis: ;pvempx ?Code Status: full ?Family Communication: none ?Disposition:  ? ?Status is: Inpatient ?Remains inpatient appropriate because: need for IV abx ?  ?Consultants:  ?Cardiology ? ?Procedures:  ?none ? ?Antimicrobials:  ?Anti-infectives (From admission, onward)  ? ? Start  Dose/Rate Route Frequency Ordered Stop  ? 03/24/22 2300  vancomycin (VANCOCIN) IVPB 1000 mg/200 mL premix       ? 1,000 mg ?200 mL/hr over 60  Minutes Intravenous Every 12 hours 03/24/22 1039 03/31/22 1059  ? 03/24/22 1100  vancomycin (VANCOCIN) IVPB 1000 mg/200 mL premix  Status:  Discontinued       ? 1,000 mg ?200 mL/hr over 60 Minutes Intravenous  Once 03/24/22 1011 03/24/22 1039  ? 03/24/22 1100  vancomycin (VANCOREADY) IVPB 2000 mg/400 mL       ? 2,000 mg ?200 mL/hr over 120 Minutes Intravenous  Once 03/24/22 1039    ? 03/24/22 1030  cefTRIAXone (ROCEPHIN) 2 g in sodium chloride 0.9 % 100 mL IVPB       ? 2 g ?200 mL/hr over 30 Minutes Intravenous Daily 03/24/22 1011 03/31/22 0959  ? ?  ? ? ?Subjective: ?Le pain ? ?Objective: ?Vitals:  ? 03/23/22 2122 03/24/22 0253 03/24/22 0500 03/24/22 1525  ?BP: (!) 132/92 (!) 156/114  (!) 143/90  ?Pulse: 100 (!) 110  (!) 103  ?Resp: 17   19  ?Temp: 97.7 ?F (36.5 ?C)   98.1 ?F (36.7 ?C)  ?TempSrc: Oral   Oral  ?SpO2:    99%  ?Weight:   87.9 kg   ?Height:      ? ? ?Intake/Output Summary (Last 24 hours) at 03/24/2022 1549 ?Last data filed at 03/24/2022 1531 ?Gross per 24 hour  ?Intake 4.22 ml  ?Output 2925 ml  ?Net -2920.78 ml  ? ?Filed Weights  ? 03/23/22 1329 03/24/22 0500  ?Weight: 94 kg 87.9 kg  ? ? ?Examination: ? ?General exam: Appears calm and comfortable  ?Respiratory system: unlabored ?Cardiovascular system: RRR ?Gastrointestinal system: Abdomen is nondistended, soft and nontende ?Central nervous system: Alert and oriented. No focal neurological deficits. ?Extremities: bilateral LE edema with wounds ? ? ?Data Reviewed: I have personally reviewed following labs and imaging studies ? ?CBC: ?Recent Labs  ?Lab 03/20/22 ?1306 03/23/22 ?1450 03/24/22 ?0807  ?WBC 7.7 7.8 8.9  ?NEUTROABS 4.3 4.9  --   ?HGB 12.4* 14.2 12.0*  ?HCT 36.1* 44.0 36.4*  ?MCV 92.8 95.7 94.5  ?PLT 404* 520* 433*  ? ? ?Basic Metabolic Panel: ?Recent Labs  ?Lab 03/20/22 ?1306 03/23/22 ?1450 03/24/22 ?0807  ?NA 134* 139 137  ?K 4.4 4.1 4.6  ?CL 103 103 103  ?CO2 21* 26 24  ?GLUCOSE 93 84 103*  ?BUN 13 19 21*  ?CREATININE 1.15 1.14 1.17  ?CALCIUM  8.3* 9.0 8.6*  ? ? ?GFR: ?Estimated Creatinine Clearance: 78.7 mL/min (by C-G formula based on SCr of 1.17 mg/dL). ? ?Liver Function Tests: ?Recent Labs  ?Lab 03/20/22 ?1306 03/23/22 ?1450 03/24/22 ?0807  ?AST 20 26 18   ?ALT 20 21 17   ?ALKPHOS 92 125 103  ?BILITOT 1.3* 1.0 1.1  ?PROT 6.2* 7.6 6.4*  ?ALBUMIN 2.6* 3.4* 2.7*  ? ? ?CBG: ?No results for input(s): GLUCAP in the last 168 hours. ? ? ?Recent Results (from the past 240 hour(s))  ?Culture, blood (Routine x 2)     Status: None (Preliminary result)  ? Collection Time: 03/20/22  1:06 PM  ? Specimen: BLOOD  ?Result Value Ref Range Status  ? Specimen Description BLOOD LEFT ANTECUBITAL  Final  ? Special Requests AEROBIC BOTTLE ONLY Blood Culture adequate volume  Final  ? Culture   Final  ?  NO GROWTH 4 DAYS ?Performed at [facility], [place] [address] [place], [place] [address] ?  ?  Report Status PENDING  Incomplete  ?Culture, blood (Routine x 2)     Status: None (Preliminary result)  ? Collection Time: 03/20/22  1:06 PM  ? Specimen: BLOOD  ?Result Value Ref Range Status  ? Specimen Description BLOOD RIGHT ANTECUBITAL  Final  ? Special Requests   Final  ?  BOTTLES DRAWN AEROBIC AND ANAEROBIC Blood Culture adequate volume  ? Culture   Final  ?  NO GROWTH 4 DAYS ?Performed at [facility], [place] [address] [place], [place] [address] ?  ? Report Status PENDING  Incomplete  ?  ? ? ? ? ? ?Radiology Studies: ?DG Chest 2 View ? ?Result Date: 03/23/2022 ?CLINICAL DATA:  Shortness of breath. EXAM: CHEST - 2 VIEW COMPARISON:  AP chest 03/20/2022 CT chest 03/02/2021 FINDINGS: Cardiac silhouette is again moderately to markedly enlarged. Mediastinal contours are within normal limits with calcification again seen within aortic arch. The lungs are clear. No pleural effusion or pneumothorax. Mild multilevel degenerative disc changes of the thoracic spine. IMPRESSION: Unchanged cardiomegaly without acute lung process. Electronically Signed   By: Yvonne [name] M.D.   On:  03/23/2022 15:43  ? ?MR FOOT RIGHT WO CONTRAST ? ?Result Date: 03/23/2022 ?CLINICAL DATA:  Foot swelling, diabetic, osteomyelitis suspected. EXAM: MRI OF THE RIGHT FOREFOOT WITHOUT CONTRAST TECHNIQUE: Multipl

## 2022-03-24 NOTE — Progress Notes (Signed)
Pharmacy Antibiotic Note ? ?[name] is a 56 y.o. male with chronic bilateral LE wounds presented to the ED on 03/23/2022 with c/o bilateral LE pain and swelling.  Pharmacy has been consulted to dose vancomycin for cellulitis. ? ?Plan: ?- vancomycin 2000 mg IV x1, then 1000mg  q12h for est AUC 500 ?- ceftriaxone 2gm q24h per MD ? ?________________________________________ ? ?Height: 5\' 8"  (172.7 cm) ?Weight: 87.9 kg (193 lb 12.6 oz) ?IBW/kg (Calculated) : 68.4 ? ?Temp (24hrs), Avg:98.1 ?F (36.7 ?C), Min:97.7 ?F (36.5 ?C), Max:98.4 ?F (36.9 ?C) ? ?Recent Labs  ?Lab 03/17/22 ?1152 03/20/22 ?1306 03/23/22 ?1450 03/24/22 ?0807  ?WBC 8.3 7.7 7.8 8.9  ?CREATININE 0.92 1.15 1.14 1.17  ?LATICACIDVEN  --  1.4  --   --   ?  ?Estimated Creatinine Clearance: 78.7 mL/min (by C-G formula based on SCr of 1.17 mg/dL).   ? ?Allergies  ?Allergen Reactions  ? Ibuprofen Anaphylaxis  ? Acetaminophen Hives, Nausea And Vomiting and Rash  ?  Pt overdosed on bottle of APAP 05/06/16 ?  ? Lyrica Cr [Pregabalin Er] Other (See Comments)  ?  Dystonia and dysarthria  ? Peanut-Containing Drug Products Other (See Comments)  ?  Pt denies allergy to peanut butter 03/02/2021  ? ? ?Thank you for allowing pharmacy to be a part of this patient?s care. ? ?07/06/16 ?03/24/2022 10:32 AM ? ?

## 2022-03-24 NOTE — Progress Notes (Signed)
Patient noted to have lost IV access at 1300 Patient refused attempts at IV access, patient noted to finally have agreed to IV access via IV team vs floor staff. IV consult placed.  ?

## 2022-03-24 NOTE — Progress Notes (Signed)
This shift attempt was made for CT to be done while pt was downstairs getting MRI. Writer made aware that pt refused CT  due to pain. Will revisit offer to CT when pain managed. ?

## 2022-03-24 NOTE — Progress Notes (Signed)
Patient noted to have gained IV access via IV nurse. Patient refused antibiotic therapy. Patient educated on the importance of IV antibiotics with BLE wounds. Patient stated he will receive the IV antibiotics after he eats.  ?

## 2022-03-24 NOTE — Progress Notes (Signed)
Unable to get pt to return to CT after pain level became more tolerable. Pt has also refused morning labs, vitals , EKG and certain  medications. Hospitalist notified and aware, writer informed to pass this information to dayshift. Will continue to monitor pt. ?

## 2022-03-24 NOTE — Progress Notes (Signed)
OT Cancellation Note ? ?Patient Details ?Name: [name] ?MRN: [id number] ?DOB: Apr 27, 1966 ? ? ?Cancelled Treatment:    Reason Eval/Treat Not Completed: Patient at procedure or test/ unavailable ?IV team in room attempting to collect sample with patient. OT to continue to follow and check back as schedule will allow.  ?Lavere Stork OTR/L, MS ?Acute Rehabilitation Department ?Office# [phone] ?Pager# [phone] ? ?03/24/2022, 2:13 PM ?

## 2022-03-24 NOTE — Hospital Course (Addendum)
[name] is [name] 56 y.o. male with medical history significant of stroke with residual dysarthria/right sided weakness (wheelchair bound), HFrEF (EF <20%), CAD, T2DM (diet controlled), cocaine abuse, schizoaffective disorder and multiple other medical problems presenting with bilateral lower extremity pain and swelling. ? ?Imaging currently pending.  Treating for HF exacerbation and possible infection.   ? ?See below for additional details ?

## 2022-03-24 NOTE — Evaluation (Signed)
Physical Therapy Evaluation ?Patient Details ?Name: [name] ?MRN: [id number] ?DOB: 09/12/1966 ?Today's Date: 03/24/2022 ? ?History of Present Illness ? [name] is a 56 y.o. male  presenting with bilateral lower extremity pain and swelling. PMH significant of stroke with residual dysarthria/right sided weakness (wheelchair bound), HFrEF (EF <20%), CAD, T2DM (diet controlled), cocaine abuse, schizoaffective disorder and multiple other medical problems. ? ?  ?Clinical Impression ? [name] is 56 y.o. male admitted with above HPI and diagnosis. Patient is currently limited by functional impairments below (see PT problem list). Per chart review pt struggling with homelessness and has been a wheelchair user due to foot/LE wounds, it is unclear how much assist the pt has available as he is not a reliable historian. Eval limited by pt declining further attempts at LE/UE strength testing or transfers to [facility] after sitting up EOB. Patient will benefit from continued skilled PT interventions to address impairments and progress independence with mobility, recommending SNF with 24/7 assist. Acute PT will follow and progress as able.    ?   ? ?Recommendations for follow up therapy are one component of a multi-disciplinary discharge planning process, led by the attending physician.  Recommendations may be updated based on patient status, additional functional criteria and insurance authorization. ? ?Follow Up Recommendations Skilled nursing-short term rehab (<3 hours/day) ? ?  ?Assistance Recommended at Discharge Frequent or constant Supervision/Assistance  ?Patient can return home with the following ? Help with stairs or ramp for entrance;Assist for transportation;Assistance with cooking/housework;Direct supervision/assist for medications management;Two people to help with walking and/or transfers;A lot of help with bathing/dressing/bathroom ? ?  ?Equipment Recommendations None recommended by PT  ?Recommendations  for Other Services ?    ?  ?Functional Status Assessment Patient has had a recent decline in their functional status and/or demonstrates limited ability to make significant improvements in function in a reasonable and predictable amount of time  ? ?  ?Precautions / Restrictions Precautions ?Precautions: Fall ?Precaution Comments: bil feet and lower leg wounds ?Restrictions ?Weight Bearing Restrictions: No  ? ?  ? ?Mobility ? Bed Mobility ?Overal bed mobility: Needs Assistance ?Bed Mobility: Supine to Sit, Sit to Supine ?  ?  ?Supine to sit: Supervision, HOB elevated ?Sit to supine: Supervision, HOB elevated ?  ?General bed mobility comments: pt using bed rail, extra time. sat upright for ~2 seconds and laid back to Rt side. pt returned to supine quickly due to pain ?  ? ?Transfers ?  ?  ?  ?  ?  ?  ?  ?  ?  ?General transfer comment: deferred ?  ? ?Ambulation/Gait ?  ?  ?  ?  ?  ?  ?  ?  ? ?Stairs ?  ?  ?  ?  ?  ? ?Wheelchair Mobility ?  ? ?Modified Rankin (Stroke Patients Only) ?  ? ?  ? ?Balance Overall balance assessment: Needs assistance ?Sitting-balance support: Bilateral upper extremity supported ?Sitting balance-Leahy Scale: Fair ?  ?  ?  ?  ?  ?  ?  ?  ?  ?  ?  ?  ?  ?  ?  ?  ?   ? ? ? ?Pertinent Vitals/Pain Pain Assessment ?Pain Assessment: Faces ?Faces Pain Scale: Hurts even more ?Pain Location: LE's/feet ?Pain Descriptors / Indicators: Grimacing, Guarding, Discomfort ?Pain Intervention(s): Limited activity within patient's tolerance  ? ? ?Home Living Family/patient expects to be discharged to:: Shelter/Homeless ?  ?  ?  ?  ?  ?  ?  ?  ?  ?  Additional Comments: Pt difficult to understand PLOF obtained from chart review as well as pt report  ?  ?Prior Function Prior Level of Function : Needs assist ?  ?  ?  ?  ?  ?  ?Mobility Comments: from wc level.  per pt he gets to chair on his own, prior visits indicates he has assist  to transfer from [facility] <> ground for sleeping ?ADLs Comments: uses baby wipes for  bathing, gets food from hand outs ?  ? ? ?Hand Dominance  ? Dominant Hand: Right ? ?  ?Extremity/Trunk Assessment  ? Upper Extremity Assessment ?Upper Extremity Assessment: Defer to OT evaluation ?  ? ?Lower Extremity Assessment ?Lower Extremity Assessment: Difficult to assess due to impaired cognition ?RLE Deficits / Details: pt using UE's to move bil LE's off EOB, declined specific testing for LE strength, able to lift LE's back onto bed with some use of UE's ?LLE Deficits / Details: pt using UE's to move bil LE's off EOB, declined specific testing for LE strength, able to lift LE's back onto bed with some use of UE's ?  ? ?Cervical / Trunk Assessment ?Cervical / Trunk Assessment: Normal  ?Communication  ? Communication: Expressive difficulties (mumbled, slurred speech)  ?Cognition Arousal/Alertness: Awake/alert ?Behavior During Therapy: [facility] for tasks assessed/performed ?Overall Cognitive Status: No family/caregiver present to determine baseline cognitive functioning ?Area of Impairment: Attention, Following commands, Safety/judgement, Awareness, Problem solving, Memory ?  ?  ?  ?  ?  ?  ?  ?  ?  ?Current Attention Level: Focused ?Memory: Decreased recall of precautions ?Following Commands: Follows one step commands inconsistently ?Safety/Judgement: Decreased awareness of safety, Decreased awareness of deficits ?Awareness: Intellectual ?Problem Solving: Difficulty sequencing, Requires verbal cues, Decreased initiation ?  ?  ?  ? ?  ?General Comments   ? ?  ?Exercises    ? ?Assessment/Plan  ?  ?PT Assessment Patient needs continued PT services  ?PT Problem List Decreased strength;Decreased mobility;Decreased range of motion;Decreased coordination;Decreased activity tolerance;Decreased skin integrity;Pain;Decreased knowledge of use of DME ? ?   ?  ?PT Treatment Interventions Functional mobility training;Therapeutic activities;Therapeutic exercise;Balance training;Neuromuscular re-education;Patient/family  education;DME instruction;Wheelchair mobility training;Cognitive remediation   ? ?PT Goals (Current goals can be found in the Care Plan section)  ?Acute Rehab PT Goals ?Patient Stated Goal: decrease pain ?PT Goal Formulation: With patient ?Time For Goal Achievement: 04/07/22 ?Potential to Achieve Goals: Fair ? ?  ?Frequency Min 2X/week ?  ? ? ?Co-evaluation   ?  ?  ?  ?  ? ? ?  ?AM-PAC PT "6 Clicks" Mobility  ?Outcome Measure Help needed turning from your back to your side while in a flat bed without using bedrails?: A Little ?Help needed moving from lying on your back to sitting on the side of a flat bed without using bedrails?: A Little ?Help needed moving to and from a bed to a chair (including a wheelchair)?: Total ?Help needed standing up from a chair using your arms (e.g., wheelchair or bedside chair)?: Total ?Help needed to walk in hospital room?: Total ?Help needed climbing 3-5 steps with a railing? : Total ?6 Click Score: 10 ? ?  ?End of Session   ?Activity Tolerance: Patient limited by pain ?Patient left: in bed;with call bell/phone within reach;with nursing/sitter in room ?Nurse Communication: Mobility status ?PT Visit Diagnosis: Other abnormalities of gait and mobility (R26.89);Difficulty in walking, not elsewhere classified (R26.2);Muscle weakness (generalized) (M62.81) ?  ? ?Time: 9381-0175 ?PT Time Calculation (min) (ACUTE ONLY): 10 min ? ? ?  Charges:   PT Evaluation ?$PT Eval Low Complexity: 1 Low ?  ?  ?   ? ? ?Renaldo Fiddler PT, DPT ?Acute Rehabilitation Services ?Office [phone] ?Pager [phone]  ? ?Anitra Lauth ?03/24/2022, 4:05 PM ? ?

## 2022-03-24 NOTE — Consult Note (Addendum)
?Cardiology Consultation:  ? ?Patient ID: [name] ?MRN: [id number]; DOB: 11/20/1966 ? ?Admit date: 03/23/2022 ?Date of Consult: 03/24/2022 ? ?PCP:  Marliss Coots, NP ?  ?[facility] ?Cardiologist:  Minus Breeding, MD   { ? ? ?Patient Profile:  ? ?[name] is a 56 y.o. 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 Florene Glen.  ? ?History of Present Illness:  ? ?Mr. Seals with above PMH presented to ER yesterday with c.o of suicidal ideation. He is homeless and has frequent ER visits. He had complained BLE pain and swelling.  ? ?He is followed by cardiology Dr Percival Spanish. He has a history of noncompliance and polysubstance abuse with alcohol and cocaine.  We had seen him multiple times in the hospital but he never follows up in the office.  He has 100% no-show rate in our office.  He was initially diagnosed with systolic CHF in 7494 when he was found to have a LVEF of 35-40%.  However, repeat echo in 03/2019 showed significant drop in LVEF to 15%. Prior cardiac catheterization at [facility] in 10/2019 showed diffuse atherosclerosis in all coronary distributions with the worst lesion being 60-70% stenosis in the proximal circumflex.  Degree of cardiomyopathy was felt to be out of proportion to degree of CAD suggesting a nonischemic etiology.  He underwent coronary CTA in 03/2020 which showed moderate to severe CAD.  Functional assessment was recommended at that time; however, he left AMA prior to evaluation.  Nuclear stress test was ordered as an outpatient but was deferred in the setting of wheezing on exam.  He never followed up in clinic after this.  Overall, he has been felt to be a poor candidate for invasive  ischemic evaluation due to noncompliance and cocaine abuse.  ? ?He had recent hospitalization 01/2022, had slurred speech with HTN urgency, MRI of brain negative for CVA. He was noted to be volume overloaded at that time and was diuresed with IV Lasix.  He was also treated for a COPD exacerbation with steroids.  Echo showed LVEF of less than 20% with mild LVH, grade 3 diastolic dysfunction, and elevated LVEDP.  RV is mildly enlarged with mildly reduced systolic function and moderately elevated PSAP of 47.7 mmHg.  He was counseled extensively on the importance of fixation of cocaine and alcohol use as well as medication compliance.  He ultimately left AMA. He did not receive any medications at discharge but was on Coreg, Bidil, Spironolactone, and Lasix prior to discharge. ? ?Since then he had multiple ER visit for leg pain and swelling and progressive SOB. Cardiology was last consulted for CHF on 03/03/22 hospitalization for COPD exacerbation, CHF, AKI, bilateral leg pain/neuropathy.  He received diuresis with IV Lasix and was Net 24 L on 03/07/22 and was recommended continue GDMT with coreg, lasix 35m BID, hydralazine, imdur, entresto, and aldactone. Social worker was involved to help him with getting meds.  ? ?He returned to ER yesterday continue complain bilateral leg pain and swelling, expressed suicidal ideation, and endorsees SOB. Upon encounter, he is angry, c/o his legs is very painful with numerous wounds. He would not communicate if he is taking his CHF meds or refuse to answer questions about his heart. History obtaining  is limited.  ? ?Admission diagnostic showed grossly unremarkable CBC, CMP. Hs trop 41 >43. BNP 1636. ESR elevated 63. D dimer 0.71. UA + protein, SG 1.032. ETOH <10. CXR showed no acute findings. EKG with ST 115bpm, LVH, biatrial enlargement, no significant change. He was admitted to hospital medicine. CTA chest pending rule out PE. He was placed on Lasix IV 74m BID. Cardiology is consulted  today for CHF.  ?  ? ? ?Past Medical History:  ?Diagnosis Date  ? Asthma   ? Bipolar 1 disorder ([facility]   ? CHF (congestive heart failure) ([facility]   ? COPD (chronic obstructive pulmonary disease) ([facility]   ? Depression   ? Diabetes mellitus without complication ([facility]   ? GERD (gastroesophageal reflux disease)   ? Homelessness   ? Hypertension   ? QT prolongation   ? Schizoaffective disorder ([facility] [place]   ? ? ?Past Surgical History:  ?Procedure Laterality Date  ? 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 Procedure: AMPUTATION TOE;  Surgeon: PEvelina Bucy DPM;  [place] [facility]  Service: Podiatry;  Laterality: Bilateral;  Ok to leave patient on hospital bed  ? IR ANGIO INTRA EXTRACRAN SEL COM CAROTID INNOMINATE BILAT MOD SED  12/17/2020  ? IR ANGIO VERTEBRAL SEL VERTEBRAL BILAT MOD SED  12/17/2020  ? RADIOLOGY WITH ANESTHESIA N/A 12/17/2020  ? Procedure: IR WITH ANESTHESIA;  Surgeon: Radiologist, Medication, MD;  [facility]  Service: Radiology;  Laterality: N/A;  ?  ? ?Home Medications:  ?Prior to Admission medications   ?Medication Sig Start Date End Date Taking? Authorizing Provider  ?albuterol (VENTOLIN HFA) 108 (90 Base) MCG/ACT inhaler Inhale 2 puffs into the lungs every 6 (six) hours as needed for wheezing or shortness of breath. 03/07/22  Yes LOswald Hillock MD  ?atorvastatin (LIPITOR) 10 MG tablet Take 4 tablets (40 mg total) by mouth daily. 03/07/22  Yes LOswald Hillock MD  ?carvedilol (COREG) 3.125 MG tablet Take 1 tablet (3.125 mg total) by mouth 2 (two) times daily with a meal. 03/07/22  Yes LDarrick Meigs GMarge Duncans MD  ?folic acid (FOLVITE) 1 MG tablet Take 1 tablet (1 mg total) by mouth daily. 03/08/22  Yes LOswald Hillock MD  ?furosemide (LASIX) 40 MG tablet Take 1 tablet (40 mg total) by mouth 2 (two) times daily. 03/07/22 03/07/23 Yes LOswald Hillock MD  ?gabapentin (NEURONTIN) 400 MG capsule Take 1 capsule (400 mg total) by mouth 3 (three) times daily. 03/07/22  Yes LOswald Hillock MD  ?hydrALAZINE (APRESOLINE) 25 MG tablet Take 1 tablet  (25 mg total) by mouth 3 (three) times daily. 03/07/22 07/05/22 Yes LOswald Hillock MD  ?isosorbide mononitrate (IMDUR) 60 MG 24 hr tablet Take 1 tablet (60 mg total) by mouth daily. 03/07/22 07/05/22 Yes LOswald Hillock MD  ?levETIRAcetam (KEPPRA) 500 MG tablet Take 1 tablet (500 mg total) by mouth 2 (two) times daily. 03/07/22 04/06/22 Yes LOswald Hillock MD  ?OLANZapine (ZYPREXA) 5 MG tablet Take 1 tablet (5 mg total) by mouth daily. 03/07/22  Yes LOswald Hillock MD  ?oxyCODONE (OXY IR/ROXICODONE) 5 MG immediate release tablet Take 1-2 tablets (5-10 mg total) by mouth every 6 (six) hours as needed for severe pain or breakthrough pain. 03/07/22  Yes LOswald Hillock MD  ?spironolactone (ALDACTONE) 25 MG tablet Take 1/2 tablet (12.5 mg total) by mouth daily. 03/08/22  Yes LOswald Hillock MD  ?thiamine 100 MG tablet Take 1 tablet (100 mg total) by mouth daily. 03/08/22  Yes [name], MD  ?tiotropium (SPIRIVA HANDIHALER) 18 MCG inhalation capsule Place 1 capsule (18 mcg total) into inhaler and inhale daily as directed. 03/07/22 06/05/22 Yes [name], MD  ?valsartan (DIOVAN) 40 MG tablet Take 1/2 tablet (20 mg total) by mouth 2 (two) times daily. 03/07/22 03/07/23 Yes [name], MD  ? ? ?Inpatient Medications: ?Scheduled Meds: ? aspirin  81 mg Oral Daily  ? atorvastatin  40 mg Oral Daily  ? enoxaparin (LOVENOX) injection  40 mg Subcutaneous M62M  ? folic acid  1 mg Oral Daily  ? furosemide  40 mg Intravenous BID  ? gabapentin  400 mg Oral TID  ? hydrALAZINE  25 mg Oral Q8H  ? ipratropium-albuterol  3 mL Nebulization Q6H  ? isosorbide mononitrate  60 mg Oral Daily  ? levETIRAcetam  500 mg Oral BID  ? multivitamin with minerals  1 tablet Oral Daily  ? OLANZapine  5 mg Oral Daily  ? polyethylene glycol  17 g Oral Daily  ? predniSONE  40 mg Oral Q breakfast  ? spironolactone  12.5 mg Oral Daily  ? thiamine  100 mg Oral Daily  ? Or  ? thiamine  100 mg Intravenous Daily  ? umeclidinium bromide  1 puff Inhalation Daily  ? ?Continuous  Infusions: ? cefTRIAXone (ROCEPHIN)  IV    ? vancomycin    ? ?PRN Meds: ?albuterol, HYDROmorphone (DILAUDID) injection, HYDROmorphone (DILAUDID) injection, LORazepam, LORazepam **OR** LORazepam, oxyCODONE ? ?[place]

## 2022-03-24 NOTE — Consult Note (Signed)
[facility] Face-to-Face Psychiatry Consult  ? ?Reason for Consult:  Suicidal ideations ?Referring Physician:  Dr. Lowell Guitar ?Patient Identification: [name] ?MRN:  [id number] ?Principal Diagnosis: Bilateral lower extremity pain ?Diagnosis:  Principal Problem: ?  Bilateral lower extremity pain ?Active Problems: ?  Major depressive disorder, recurrent episode (HCC) ?  Alcohol use disorder, mild, abuse ?  Acute exacerbation of CHF (congestive heart failure) (HCC) ?  Elevated troponin ?  QT prolongation ?  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  COPD with acute exacerbation (HCC) ?  Cocaine abuse (HCC) ?  Homelessness ?  Dysarthria ?  History of stroke ?  Substance use disorder ?  Seizure (HCC) ?  Swelling of lower extremity ?  SIRS (systemic inflammatory response syndrome) (HCC) ?  Leg swelling ? ? ?Total Time spent with patient: 20 minutes ? ?Subjective:   ?[name] is a 56 y.o. male patient admitted with Depression, hx  Schizoaffective disorder,of bipolar, schizophrenia and cocaine abuse brought to the ER for medical issues with bilateral lower extremity pain and swelling.  Patient has a history of noncompliance, concomitant polysubstance abuse of alcohol and cocaine.  Patient is seen and assessed, case discussed with Dr. Gasper Sells.  Patient is noted to have slurred speech and severe dysarthria, frequently has to repeat himself and gets easily frustrated throughout this psychiatric evaluation.  Despite his frequent visitations to the emergency room and or hospital admissions, his brief psychosis, suicidal ideations, and irritability are notable and unable to determine if behavior is substance-induced or to functional psychotic disorder.  On initial examination, as noted patient is irritable however does not participate in psychiatric evaluation.  He denies most acute psychiatric symptoms, with the exception of suicidal ideations.  He does report his suicidal ideations are  due to increase in pain " I keep telling you all I am hurting.  Yall motherfuckers wont do anything about it so I said I will kill myself."  Patient is noted to be very nonchalant and blunt with the last statement being made, he also states if pain is appropriately managed he will no longer be suicidal. ? ?On evaluation patient is observed to be sleeping, did have to be awakened several times throughout psychiatric evaluation as he continued to doze off.  Patient is alert and oriented x3, uncooperative at times, irritable and easily frustrated, and argumentative.  Patient also declined to speak with psychiatry at this time initially due to his pain.  However patient is advised he used his excuse yesterday, and will need to wake up and proceed with psychiatric evaluation.  He describes his mood as depressed and irritable, his affect is congruent with his stated mood.  However he is unable to identify any additional depressive symptoms with the exception of suicidal ideation.  He does admit to experiencing constant suicidal thoughts secondary to pain, psychosocial stressors, environmental stressors.  He admits to history of suicide attempts, however fails to elaborate.  He denies access to any weapon.  He denies any acute psychosis to include paranoia, auditory or visual hallucinations, psychosis, delusions.  He admits to using cocaine and alcohol, almost daily however is unable to quantify how much of each substance he uses.  He currently denies any active suicidal ideations, suicidal intentions, and or suicidal thoughts at this time.  He does not appear to be exhibiting an or displaying response to internal stimuli, external stimuli, delusional thought disorder.  There was no evidence of disorganized thought process, or derailment. ? ?Patient states  he has been off his medications for schizophrenia, since his last hospitalization.  He further reports these medications were not helping him but making him more sick,  which is why he continues to use street substances.  Patient denies having any outpatient providers.  He does have recent hospitalization to behavioral health Hospital in August 2022, for similar presentation endorsing suicidal ideations (length of stay 16 days) as patient was refusing to leave, citing he had nowhere to go and could not find placement.  Ultimately patient is agreeable to resuming his home medication of Abilify.  ? ?Yesterday patient declined speaking with psychiatric provider, due to increase in physical pain.  Patient continues to endorse suicidal ideations secondary to chronic pain, worsening pain and bilateral lower extremities.  It is noted patient has refused labs and diagnostic imaging also due to his pain.  Current medical work-up for cellulitis, seizures, CHF exacerbation. ? ? ?HPI:  AA male, 56 years old brought in by EMS for SOB, SI and feet wound.  Patient is homeless and frequents the ER for Medical care.   ?He notes pain has been present for months.  Recently went to wound care.  Doesn't feel like he's making any progress.  Describes sharp shooting pain in bilateral legs at times.  Right seems worse.  In addition, he's noticed shortness of breath.  He expressed suicidal thoughts to EDP.  Notes recent cocaine use.  Says it helps with the pain.  Notes occasional drinking, says not every day.  ? ?Past Psychiatric History: Per Patient and old records-Depression, Schizoaffective disorder,hx of bipolar, schizophrenia and cocaine abuse. Pt reports several hospitalizations , more than he is able to count and has had several suicide attempts .  ? ?Risk to Self:  Denies ?Risk to Others:   Yes ?Prior Inpatient Therapy:    ?Prior Outpatient Therapy:  Noncompliant ? ?Past Medical History:  ?Past Medical History:  ?Diagnosis Date  ? Asthma   ? Bipolar 1 disorder (HCC)   ? CHF (congestive heart failure) (HCC)   ? COPD (chronic obstructive pulmonary disease) (HCC)   ? Depression   ? Diabetes mellitus  without complication (HCC)   ? GERD (gastroesophageal reflux disease)   ? Homelessness   ? Hypertension   ? QT prolongation   ? Schizoaffective disorder (HCC)   ?  ?Past Surgical History:  ?Procedure Laterality Date  ? 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 Procedure: AMPUTATION TOE;  Surgeon: Park Liter, DPM;  Location: [facility];  Service: Podiatry;  Laterality: Bilateral;  Ok to leave patient on hospital bed  ? IR ANGIO INTRA EXTRACRAN SEL COM CAROTID INNOMINATE BILAT MOD SED  12/17/2020  ? IR ANGIO VERTEBRAL SEL VERTEBRAL BILAT MOD SED  12/17/2020  ? RADIOLOGY WITH ANESTHESIA N/A 12/17/2020  ? Procedure: IR WITH ANESTHESIA;  Surgeon: Radiologist, Medication, MD;  Location: [facility];  Service: Radiology;  Laterality: N/A;  ? ?Family History:  ?Family History  ?Problem Relation Age of Onset  ? Drug abuse Mother   ? Suicidality Mother   ? ?Family Psychiatric  History: reported mom committed suicide and died but did not say what she did. ?Social History:  ?Social History  ? ?Substance and Sexual Activity  ?Alcohol Use Yes  ? Comment: infrequently  ?   ?Social History  ? ?Substance and Sexual Activity  ?Drug Use Yes  ? Types: Cocaine, Marijuana, "Crack" cocaine  ?  ?Social History  ? ?Socioeconomic History  ? Marital status: Single  ?  Spouse name:  Not on file  ? Number of children: 4  ? Years of education: Not on file  ? Highest education level: Not on file  ?Occupational History  ? Occupation: N/A  ?Tobacco Use  ? Smoking status: Every Day  ?  Years: 30.00  ?  Types: Cigarettes  ? Smokeless tobacco: Never  ?Vaping Use  ? Vaping Use: Never used  ?Substance and Sexual Activity  ? Alcohol use: Yes  ?  Comment: infrequently  ? Drug use: Yes  ?  Types: Cocaine, Marijuana, "Crack" cocaine  ? Sexual activity: Not Currently  ?  Partners: Female  ?  Birth control/protection: None  ?Other Topics Concern  ? Not on file  ?Social History Narrative  ? ** Merged History Encounter **  ?    ? ?Social Determinants of Health  ? ?Financial  Resource Strain: Not on file  ?Food Insecurity: Not on file  ?Transportation Needs: Not on file  ?Physical Activity: Not on file  ?Stress: Not on file  ?Social Connections: Not on file  ? ?Additional Soci

## 2022-03-25 ENCOUNTER — Inpatient Hospital Stay (HOSPITAL_COMMUNITY): Payer: Medicaid Other

## 2022-03-25 DIAGNOSIS — M79604 Pain in right leg: Secondary | ICD-10-CM | POA: Diagnosis not present

## 2022-03-25 DIAGNOSIS — M79605 Pain in left leg: Secondary | ICD-10-CM | POA: Diagnosis not present

## 2022-03-25 LAB — PROCALCITONIN: Procalcitonin: 0.38 ng/mL

## 2022-03-25 LAB — CULTURE, BLOOD (ROUTINE X 2)
Culture: NO GROWTH
Culture: NO GROWTH
Special Requests: ADEQUATE
Special Requests: ADEQUATE

## 2022-03-25 LAB — D-DIMER, QUANTITATIVE: D-Dimer, Quant: 3.22 ug/mL-FEU — ABNORMAL HIGH (ref 0.00–0.50)

## 2022-03-25 IMAGING — MR MR FOOT*L* W/O CM
4 of 5 series · 20 of 40 positions shown · non-contrast
Comparison: Radiographs dated [DATE]

CLINICAL DATA: Swelling, pain.  Evaluate for osteomyelitis.

EXAM:
MRI OF THE LEFT FOOT WITHOUT CONTRAST
TECHNIQUE: Multiplanar, multisequence MR imaging of the left forefoot was
performed. No intravenous contrast was administered.

[Series 3: T1 · coronal · left · 3.0mm · 0.23mm/px · 3 of 46 slices shown (1 of 2)]
[im 6/46]
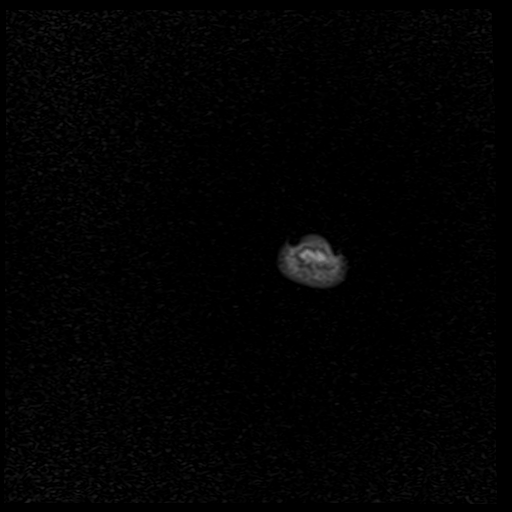
[im 26/46]
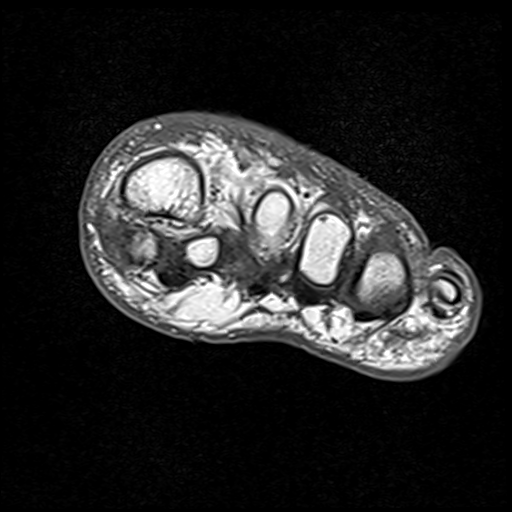
[im 41/46]
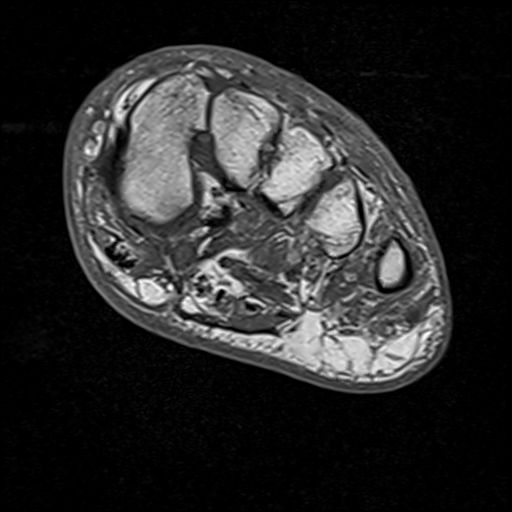

[Series 4: T2 fat-sat · coronal · left · 3.0mm · 0.23mm/px · 11 of 46 slices shown (1 of 2)]
[im 1/46]
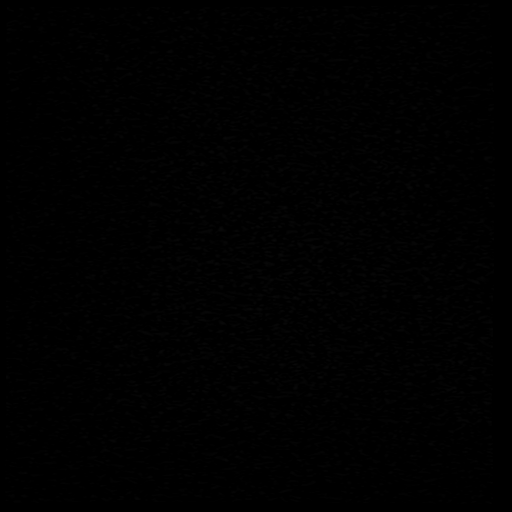
[im 5/46]
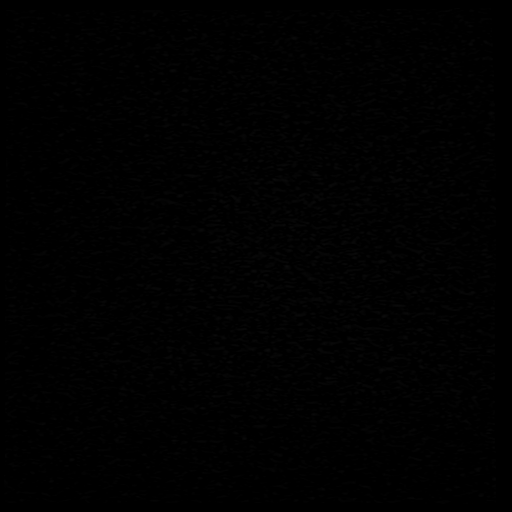
[im 10/46]
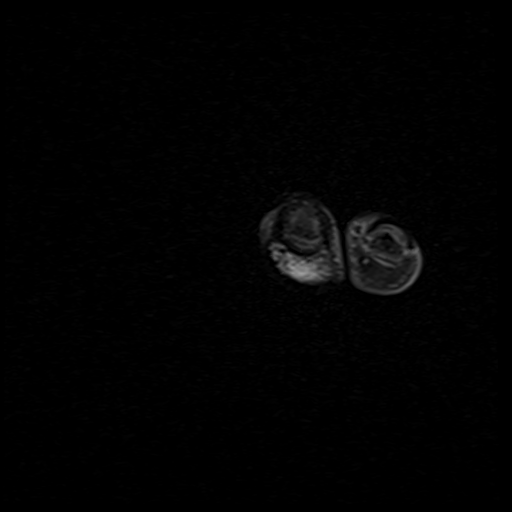
[im 14/46]
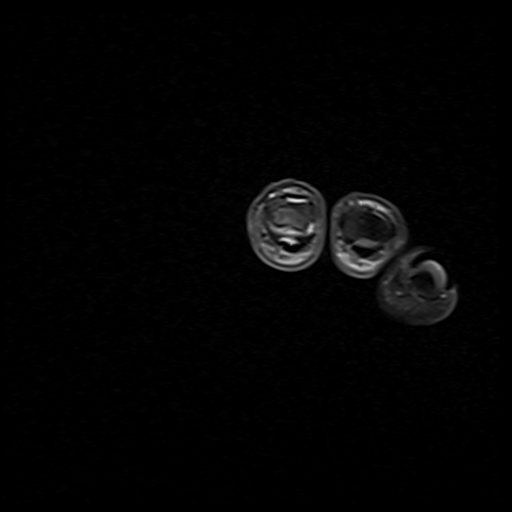
[im 19/46]
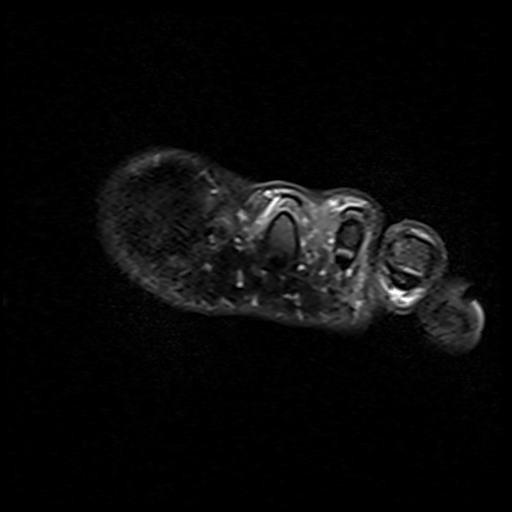
[im 23/46]
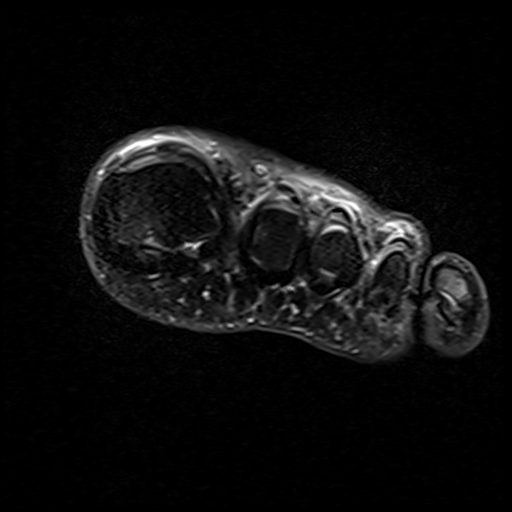
[im 28/46]
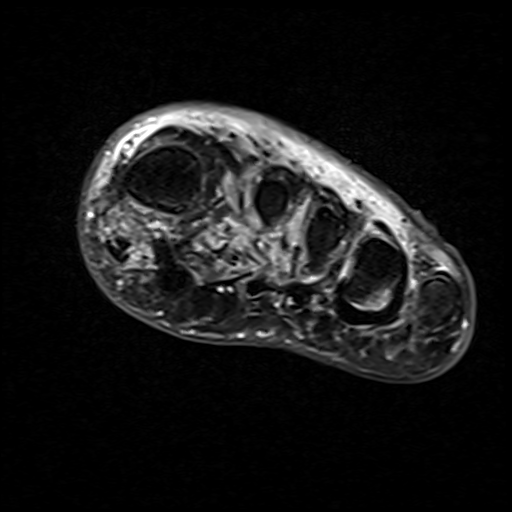
[im 32/46]
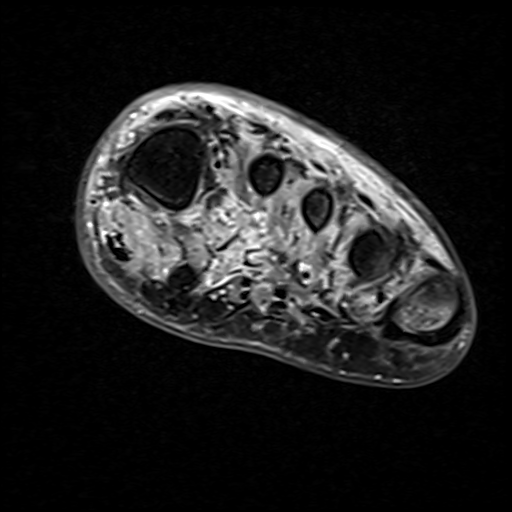
[im 37/46]
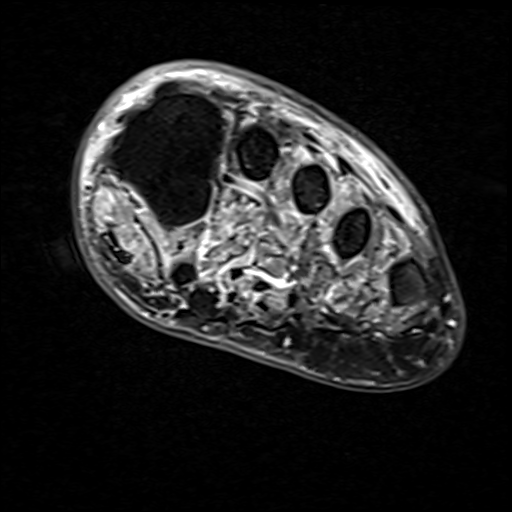
[im 41/46]
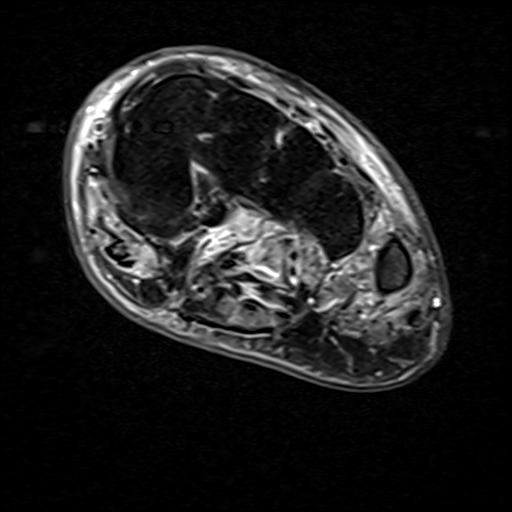
[im 46/46]
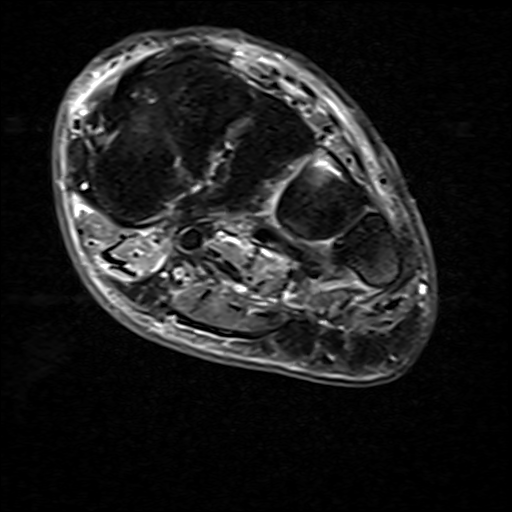

[Series 5: T2 fat-sat · axial · left · 3.0mm · 0.35mm/px · z∈[-29,+31]mm · 3 of 24 slices shown (2 of 2)]
[im 5/24]
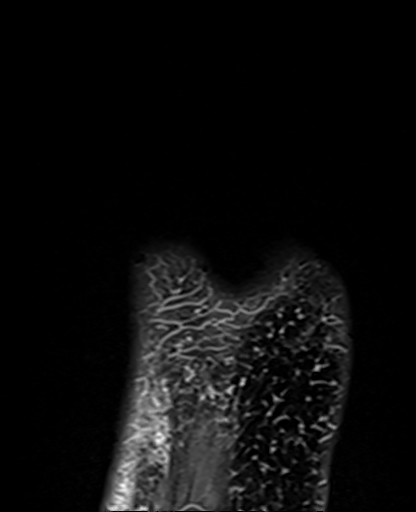
[im 14/24]
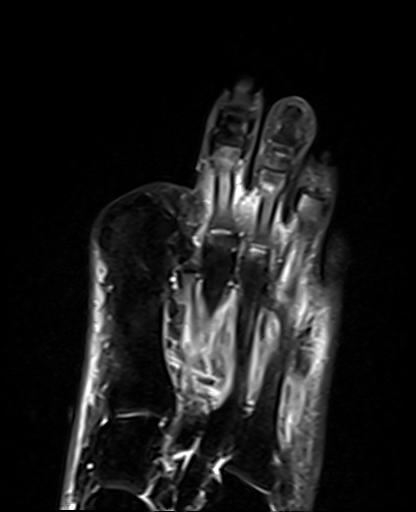
[im 24/24]
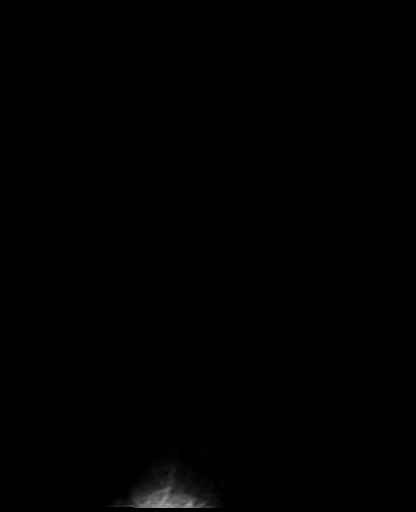

[Series 8: T1 · axial · left · 3.0mm · 0.35mm/px · z∈[-29,+32]mm · 3 of 24 slices shown (2 of 2)]
[im 5/24]
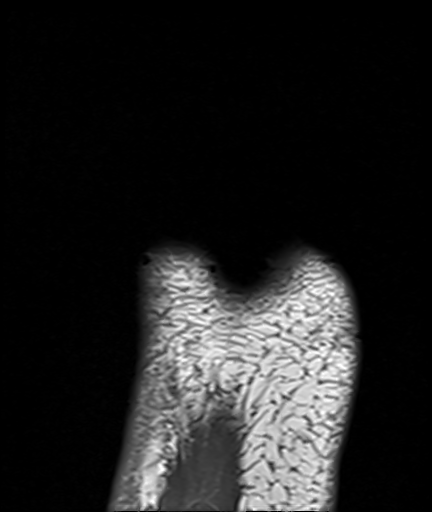
[im 14/24]
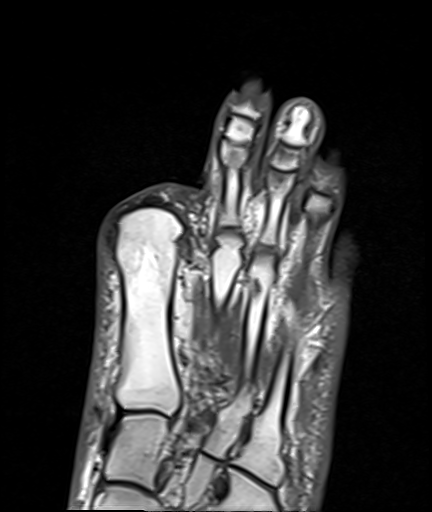
[im 24/24]
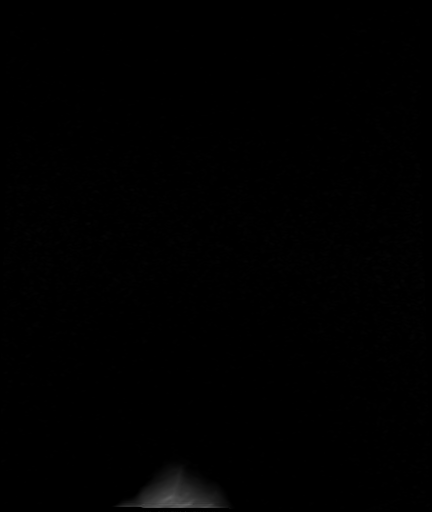

[20 of 40 positions shown; findings below may reference images not displayed]

FINDINGS: Bones/Joint/Cartilage

Postsurgical changes from prior amputation through the first
metatarsophalangeal joint.

Bone marrow edema of the proximal phalanx of the second digit with
surrounding soft tissue swelling. There is also mild edema of the
plantar aspect of the head of the fourth and fifth metatarsals.

Ligaments

Collateral ligaments are intact.  Lisfranc ligament is intact.

Muscles and Tendons

Increased intramuscular signal of the plantar muscles concerning for
diabetic myopathy/myositis. No appreciable fluid collection or
abscess.

Soft tissue
Prominent soft tissue edema about the dorsum of the foot. No soft
tissue mass.
IMPRESSION: 1. Status post prior amputation through the first
metatarsophalangeal joint.
2. Bone marrow edema of the proximal phalanx of the second digit
with surrounding soft tissue edema. If there is open wound about the
second digit this could represent osteomyelitis, in the absence of
deep skin wound these could be stress related edema due to altered
mechanics due to amputation of the first digit. Edema of the plantar
aspect of the fourth and fifth metatarsal heads is likely reactive
changes secondary to altered mechanics.
3. Generalized subcutaneous soft tissue edema about the dorsum of
the foot without evidence of drainable fluid collection or abscess.

## 2022-03-25 IMAGING — MR MR ANKLE*L* W/O CM
4 series · 22 of 40 positions shown · non-contrast
Comparison: Radiographs dated [DATE]

CLINICAL DATA: Ankle pain.  Evaluate for osteomyelitis.

EXAM:
MRI OF THE LEFT ANKLE WITHOUT CONTRAST
TECHNIQUE: Multiplanar, multisequence MR imaging of the ankle was performed. No
intravenous contrast was administered.

[Series 3: T2 fat-sat · axial · left · 4.0mm · 0.31mm/px · z∈[-3,+160]mm · 11 of 34 slices shown (1 of 2)]
[im 1/34]
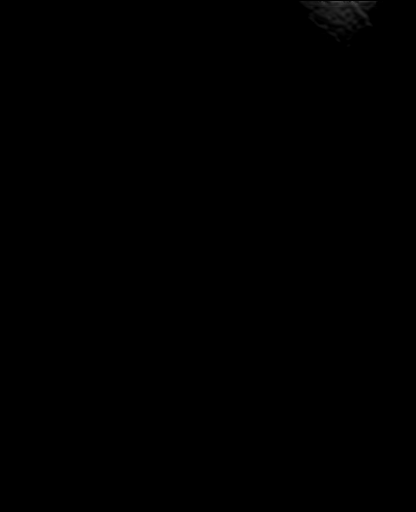
[im 4/34]
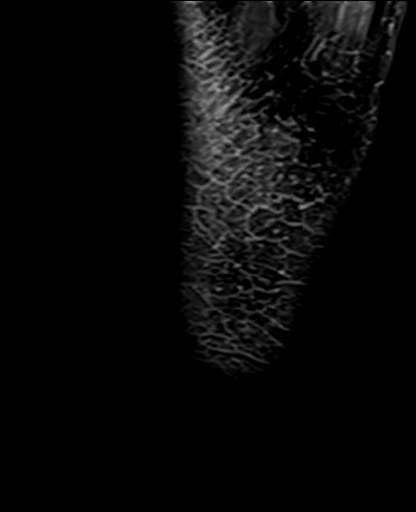
[im 7/34]
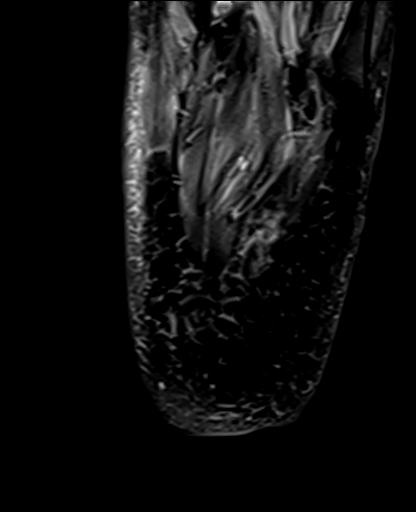
[im 10/34]
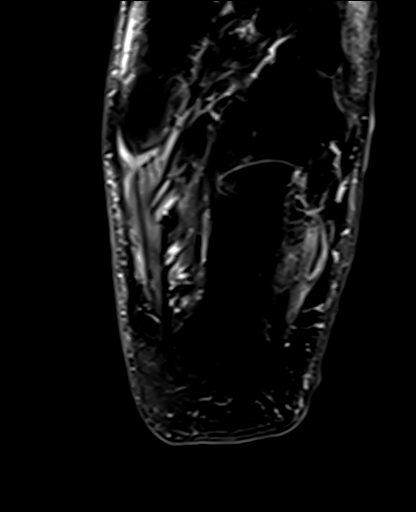
[im 14/34]
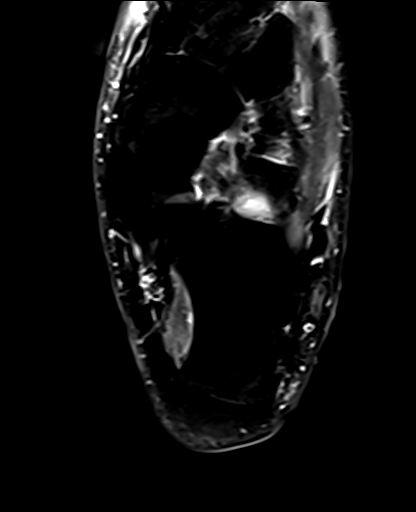
[im 17/34]
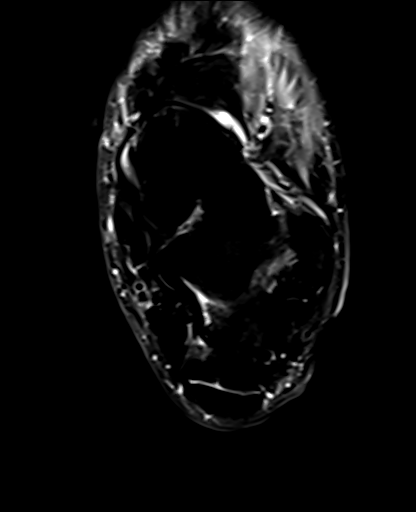
[im 20/34]
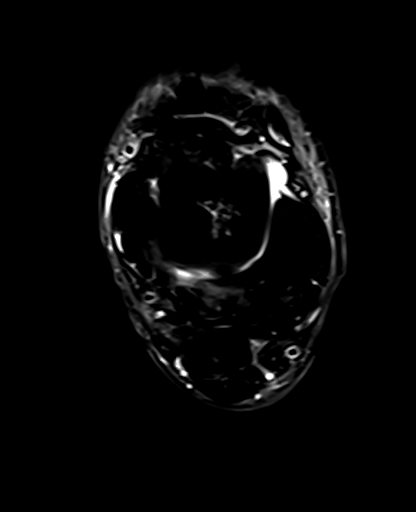
[im 24/34]
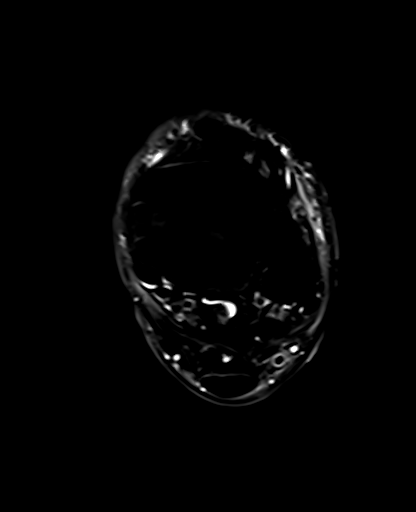
[im 27/34]
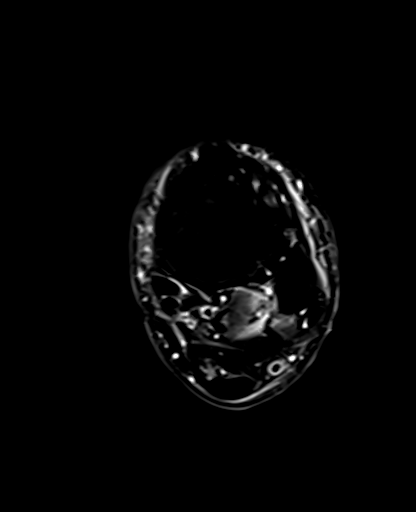
[im 30/34]
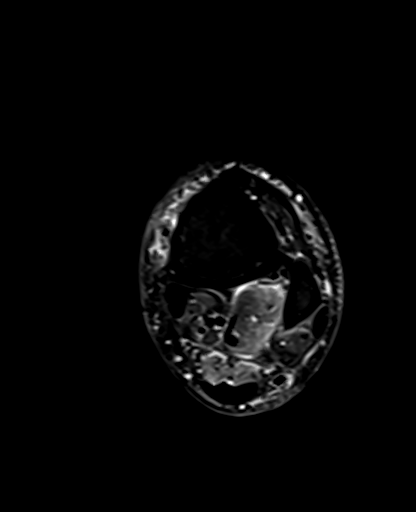
[im 34/34]
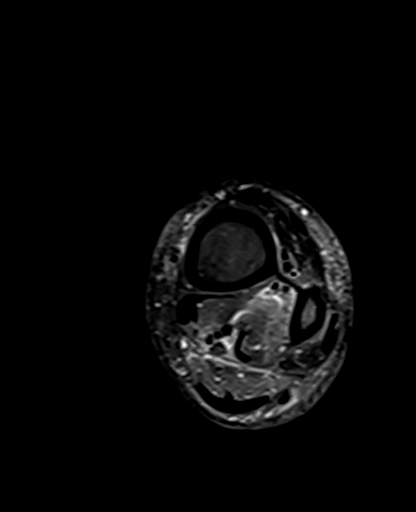

[Series 4: T2 fat-sat · coronal · left · 3.0mm · 0.31mm/px · 5 of 34 slices shown (2 of 2)]
[im 1/34]
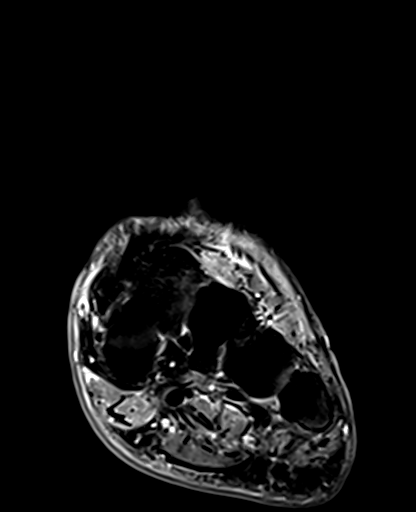
[im 4/34]
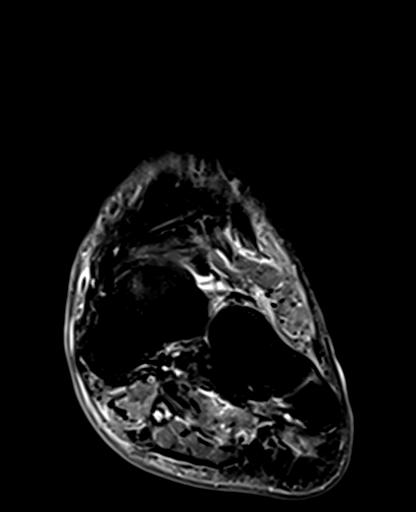
[im 7/34]
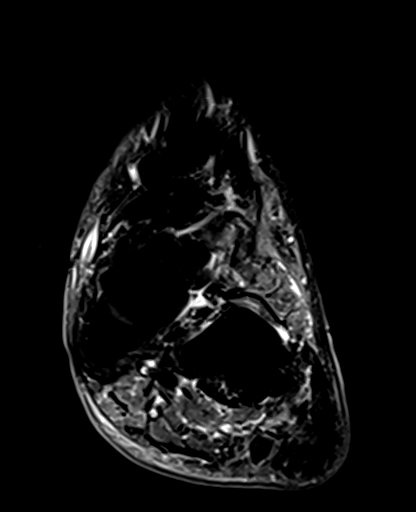
[im 17/34]
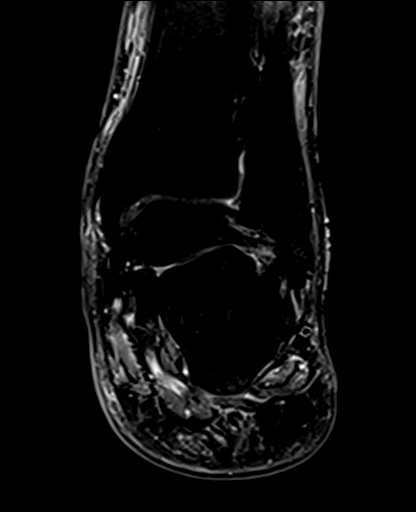
[im 30/34]
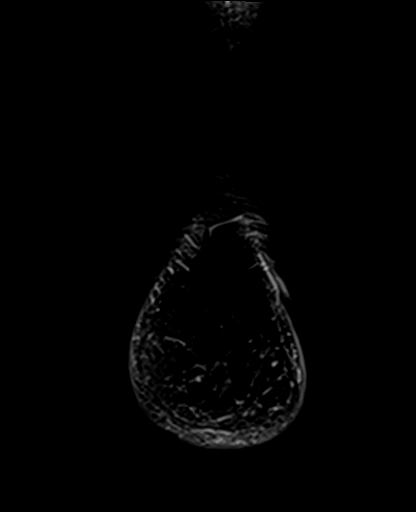

[Series 5: T1 · sagittal · left · 3.0mm · 0.31mm/px · 3 of 27 slices shown]
[im 4/27]
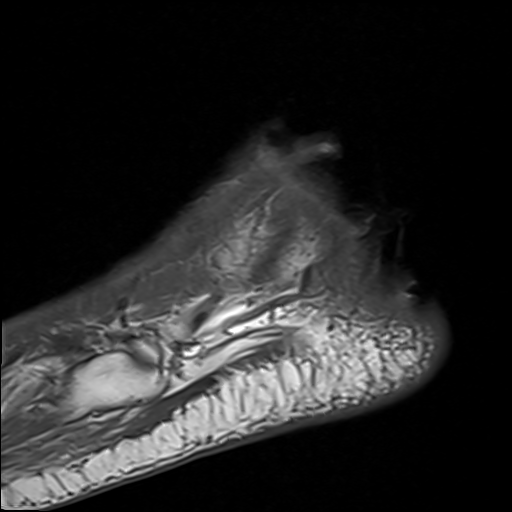
[im 14/27]
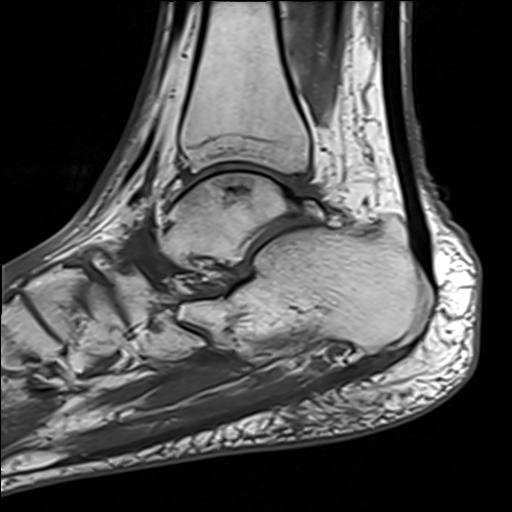
[im 23/27]
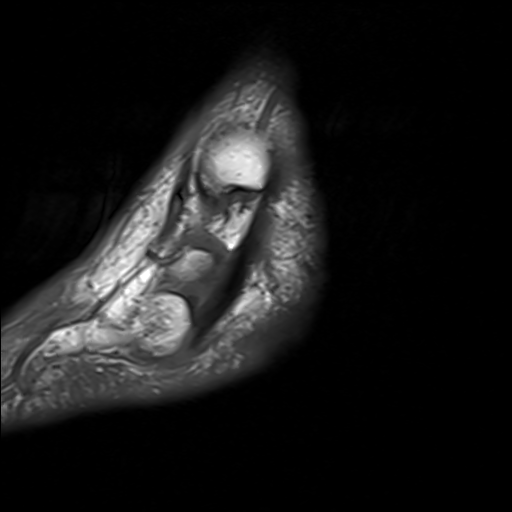

[Series 6: STIR · sagittal · left · 3.0mm · 0.31mm/px · 3 of 27 slices shown]
[im 4/27]
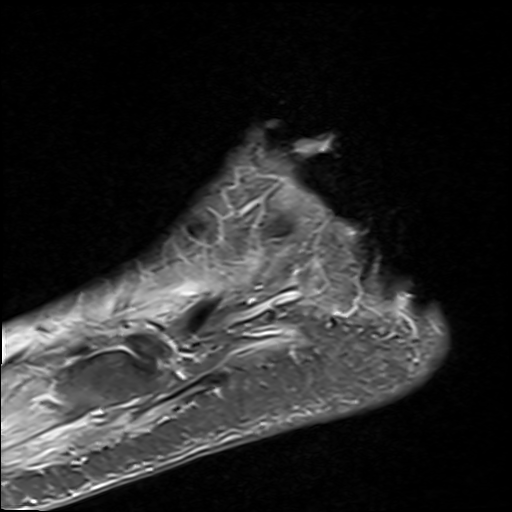
[im 14/27]
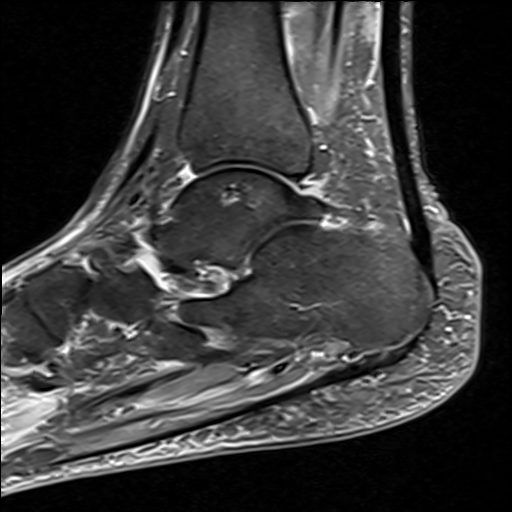
[im 23/27]
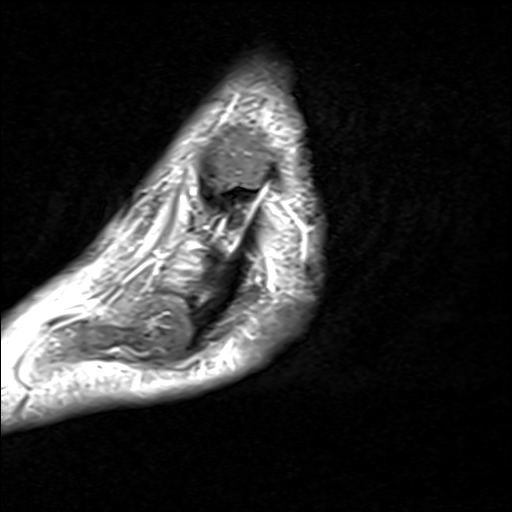

[22 of 40 positions shown; findings below may reference images not displayed]

FINDINGS: TENDONS

Peroneal: Peroneal longus tendon intact. Peroneal brevis intact.

Posteromedial: Mild thickening of the tibialis posterior without
tear. Flexor hallucis longus tendon intact. Flexor digitorum longus
tendon intact.

Anterior: Tibialis anterior tendon intact. Extensor hallucis longus
tendon intact Extensor digitorum longus tendon intact.

Achilles: Mild thickening of the Achilles tendon with intermediate
signal concerning for tendinopathy without tear

Plantar Fascia: Intact.

LIGAMENTS

Lateral: Anterior talofibular ligament intact. Calcaneofibular
ligament intact. Posterior talofibular ligament intact. Anterior and
posterior tibiofibular ligaments intact.

Medial: Deltoid ligament intact. Degeneration and thinning of the
spring ligament.

CARTILAGE

Ankle Joint: No joint effusion. Normal ankle mortise. No chondral
defect.

Subtalar Joints/Sinus Tarsi: Normal subtalar joints. No subtalar
joint effusion. Normal sinus tarsi. Degenerative changes of the
talonavicular and talonavicular joints with osteophytes.

Bones: No marrow signal abnormality. No fracture or dislocation.
Heterogeneous intraosseous cystic changes of the talar dome without
evidence of surrounding edema, which may be sequela of chronic
osteochondral injury or bone infarct.

Soft Tissue: No fluid collection or hematoma. Muscles are normal
without edema or atrophy. Tarsal tunnel is normal.
IMPRESSION: 1.  Achilles tendinopathy without discrete tear.

2. Thickening of the tibialis posterior and degenerative changes of
the spring ligament with associated mild flatfoot suggesting pes
planus deformity.

3. Marrow signal is within normal limits. No evidence of fracture
osteonecrosis or osteomyelitis.

4. Degenerative changes of the talonavicular and calcaneonavicular
joints with osteophytes.

## 2022-03-25 MED ORDER — FUROSEMIDE 40 MG PO TABS
40.0000 mg | ORAL_TABLET | Freq: Two times a day (BID) | ORAL | Status: DC
  Administered 2022-03-26 – 2022-03-31 (×10): 40 mg via ORAL
  Filled 2022-03-25 (×11): qty 1

## 2022-03-25 MED ORDER — GABAPENTIN 400 MG PO CAPS
800.0000 mg | ORAL_CAPSULE | Freq: Three times a day (TID) | ORAL | Status: DC
  Administered 2022-03-25 – 2022-03-30 (×15): 800 mg via ORAL
  Filled 2022-03-25 (×18): qty 2

## 2022-03-25 MED ORDER — GABAPENTIN 300 MG PO CAPS
600.0000 mg | ORAL_CAPSULE | Freq: Three times a day (TID) | ORAL | Status: DC
Start: 2022-03-25 — End: 2022-03-25

## 2022-03-25 MED ORDER — ARIPIPRAZOLE 5 MG PO TABS
5.0000 mg | ORAL_TABLET | Freq: Every day | ORAL | Status: DC
  Administered 2022-03-25 – 2022-03-29 (×4): 5 mg via ORAL
  Filled 2022-03-25 (×7): qty 1

## 2022-03-25 NOTE — [place] FL2 (Signed)
?[place] MEDICAID FL2 LEVEL OF CARE SCREENING TOOL  ?  ? ?IDENTIFICATION  ?Patient Name: ?[name] Birthdate: 11/11/1966 Sex: male Admission Date (Current Location): ?03/23/2022  ?[place] and [place] Number: ? [place] ?[id number] S Facility and Address:  ?[facility],  [address], [place] [address] ?     Provider Number: ?[id number]  ?Attending Physician Name and Address:  ?Zigmund Daniel., * ? Relative Name and Phone Number:  ?Meryl Crutch [phone]  [phone] ?   ?Current Level of Care: ?Hospital Recommended Level of Care: ?Skilled Nursing Facility Prior Approval Number: ?  ? ?Date Approved/Denied: ?  PASRR Number: ?  ? ?Discharge Plan: ?SNF ?  ? ?Current Diagnoses: ?Patient Active Problem List  ? Diagnosis Date Noted  ? Swelling of lower extremity 03/23/2022  ? SIRS (systemic inflammatory response syndrome) (HCC) 03/23/2022  ? Leg swelling 03/23/2022  ? Acute on chronic systolic heart failure (HCC) 02/26/2022  ? Seizure (HCC) 02/26/2022  ? Venous stasis ulcer of right ankle limited to breakdown of skin with varicose veins (HCC)   ? Hypertensive urgency 01/29/2022  ? Prolonged QT interval 01/29/2022  ? Alcohol abuse 01/29/2022  ? chronic ulcers 01/29/2022  ? Wound infection 12/04/2021  ? Open wounds involving multiple regions of lower extremity 12/03/2021  ? Mixed diabetic hyperlipidemia associated with type 2 diabetes mellitus (HCC) 12/03/2021  ? Cellulitis of right lower extremity 11/13/2021  ? Type 2 diabetes mellitus with diabetic polyneuropathy, without long-term current use of insulin (HCC) 11/13/2021  ? Antisocial personality disorder (HCC) 11/04/2021  ? Malingering 11/04/2021  ? Foot pain 04/23/2021  ? Chronic combined systolic and diastolic congestive heart failure (HCC) 03/02/2021  ? History of stroke 03/02/2021  ? Substance use disorder 03/02/2021  ? Skin ulcer of right great toe with necrosis of bone (HCC)   ? Encounter for assessment of healthcare  decision-making capacity   ? Pain, dental   ? Dystonia   ? Dysarthria   ? Shock liver 01/04/2021  ? Acute metabolic encephalopathy 01/04/2021  ? Coronary artery disease involving native coronary artery of native heart without angina pectoris 01/04/2021  ? Bipolar I disorder (HCC) 01/04/2021  ? Cocaine abuse (HCC) 01/04/2021  ? Homelessness 01/04/2021  ? HLD (hyperlipidemia) 01/04/2021  ? Osteomyelitis of great toe (HCC)   ? Ulcer of great toe, left, with necrosis of bone (HCC)   ? Ulcer of great toe, right, with necrosis of muscle (HCC)   ? Acute osteomyelitis of toe, left (HCC)   ? Acute on chronic combined systolic and diastolic CHF (congestive heart failure) (HCC) 12/20/2020  ? Respiratory failure (HCC)   ? Ischemic stroke (HCC) 12/17/2020  ? 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 Diabetic foot ulcer (HCC) 12/08/2020  ? CHF (congestive heart failure) (HCC) 11/14/2020  ? CHF exacerbation (HCC) 11/13/2020  ? Hypokalemia   ? Hyperlipidemia   ? Acute respiratory failure with hypoxia (HCC) 10/26/2020  ? COPD with acute exacerbation (HCC) 10/14/2020  ? Acute on chronic combined systolic and diastolic CHF (congestive heart failure) (HCC) 10/02/2020  ? Heart failure with reduced ejection fraction and diastolic dysfunction (HCC) 09/07/2020  ? Near syncope 07/21/2020  ? Schizoaffective disorder (HCC) 06/24/2019  ? SOB (shortness of breath)   ? QT prolongation 05/25/2019  ? Cocaine abuse (HCC)   ? Elevated troponin   ? Homeless   ? Suicidal ideation   ? Bilateral lower extremity pain 04/05/2019  ? Housing situation unstable 03/28/2019  ? Bilateral lower extremity edema   ?  Acute exacerbation of CHF (congestive heart failure) (HCC) 03/04/2019  ? Asthma 12/26/2016  ? Tobacco use disorder 12/26/2016  ? Schizophrenia (HCC) 12/26/2016  ? Cocaine abuse with cocaine-induced mood disorder (HCC) 09/06/2016  ? Alcohol use disorder, mild, abuse 07/31/2016  ? Substance induced mood disorder (HCC) 07/30/2016  ? Schizoaffective disorder,  bipolar type (HCC) 05/31/2016  ? Cocaine use disorder, severe, dependence (HCC) 05/31/2016  ? Major depressive disorder, recurrent episode (HCC) 05/30/2016  ? ? ?Orientation RESPIRATION BLADDER Height & Weight   ?  ?Self, Time, Situation, Place ? Normal Continent Weight: 187 lb 2.7 oz (84.9 kg) ?Height:  5\' 8"  (172.7 cm)  ?BEHAVIORAL SYMPTOMS/MOOD NEUROLOGICAL BOWEL NUTRITION STATUS  ?    Continent Diet (Regular)  ?AMBULATORY STATUS COMMUNICATION OF NEEDS Skin   ?Limited Assist Verbally Skin abrasions ?  ?  ?  ?    ?     ?     ? ? ?Personal Care Assistance Level of Assistance  ?Bathing, Feeding, Dressing Bathing Assistance: Limited assistance ?  ?Dressing Assistance: Limited assistance ?   ? ?Functional Limitations Info  ?Sight, Hearing, Speech Sight Info: Adequate ?Hearing Info: Adequate ?Speech Info: Adequate  ? ? ?SPECIAL CARE FACTORS FREQUENCY  ?PT (By licensed PT), OT (By licensed OT)   ?  ?PT Frequency: Minimum 5x a week ?OT Frequency: Minimum 5x a week ?  ?  ?  ?   ? ? ?Contractures Contractures Info: Not present  ? ? ?Additional Factors Info  ?Code Status, Allergies, Psychotropic Code Status Info: Full Code ?Allergies Info: Ibuprofen   Acetaminophen   Lyrica Cr (Pregabalin Er)   Peanut-containing Drug Products ?Psychotropic Info: OLANZapine (ZYPREXA) tablet 5 mg ?  ?  ?   ? ?Current Medications (03/25/2022):  This is the current hospital active medication list ?Current Facility-Administered Medications  ?Medication Dose Route Frequency Provider Last Rate Last Admin  ? albuterol (PROVENTIL) (2.5 MG/3ML) 0.083% nebulizer solution 2.5 mg  2.5 mg Nebulization Q2H PRN 03/27/2022., MD      ? aspirin chewable tablet 81 mg  81 mg Oral Daily Zigmund Daniel., MD   81 mg at 03/25/22 0932  ? atorvastatin (LIPITOR) tablet 40 mg  40 mg Oral Daily 03/27/22., MD   40 mg at 03/25/22 0932  ? cefTRIAXone (ROCEPHIN) 2 g in sodium chloride 0.9 % 100 mL IVPB  2 g Intravenous Daily 03/27/22., MD 200 mL/hr at 03/25/22 0930 2 g at 03/25/22 0930  ? enoxaparin (LOVENOX) injection 40 mg  40 mg Subcutaneous Q24H 03/27/22., MD   40 mg at 03/24/22 2128  ? folic acid (FOLVITE) tablet 1 mg  1 mg Oral Daily 2129., MD   1 mg at 03/25/22 0932  ? furosemide (LASIX) injection 40 mg  40 mg Intravenous BID 03/27/22., MD   40 mg at 03/25/22 0755  ? gabapentin (NEURONTIN) capsule 400 mg  400 mg Oral TID 03/27/22., MD   400 mg at 03/25/22 0932  ? hydrALAZINE (APRESOLINE) tablet 25 mg  25 mg Oral Q8H 03/27/22, NP   25 mg at 03/25/22 0533  ? HYDROmorphone (DILAUDID) injection 0.5 mg  0.5 mg Intravenous Q2H PRN 03/27/22., MD   0.5 mg at 03/25/22 0753  ? ipratropium-albuterol (DUONEB) 0.5-2.5 (3) MG/3ML nebulizer solution 3 mL  3 mL Nebulization Q6H 03/27/22., MD   3 mL  at 03/25/22 0819  ? isosorbide mononitrate (IMDUR) 24 hr tablet 60 mg  60 mg Oral Daily Cyndi Bender, NP   60 mg at 03/25/22 0932  ? levETIRAcetam (KEPPRA) tablet 500 mg  500 mg Oral BID Zigmund Daniel., MD   500 mg at 03/25/22 0932  ? LORazepam (ATIVAN) injection 1 mg  1 mg Intravenous Once PRN Zigmund Daniel., MD      ? LORazepam (ATIVAN) tablet 1-4 mg  1-4 mg Oral Q1H PRN Zigmund Daniel., MD   1 mg at 03/24/22 2124  ? Or  ? LORazepam (ATIVAN) injection 1-4 mg  1-4 mg Intravenous Q1H PRN Zigmund Daniel., MD   1 mg at 03/25/22 6237  ? multivitamin with minerals tablet 1 tablet  1 tablet Oral Daily Zigmund Daniel., MD   1 tablet at 03/25/22 0932  ? OLANZapine (ZYPREXA) tablet 5 mg  5 mg Oral Daily Zigmund Daniel., MD   5 mg at 03/25/22 0932  ? oxyCODONE (Oxy IR/ROXICODONE) immediate release tablet 5 mg  5 mg Oral Q4H PRN Zigmund Daniel., MD   5 mg at 03/25/22 0753  ? polyethylene glycol (MIRALAX / GLYCOLAX) packet 17 g  17 g Oral Daily Zigmund Daniel., MD   17 g at 03/23/22 2245  ? predniSONE (DELTASONE) tablet 40 mg  40  mg Oral Q breakfast Zigmund Daniel., MD   40 mg at 03/25/22 6283  ? spironolactone (ALDACTONE) tablet 12.5 mg  12.5 mg Oral Daily Zigmund Daniel., MD   12.5 mg at 03/25/22 0931  ? thiamine tablet

## 2022-03-25 NOTE — Progress Notes (Signed)
Orthopedic Tech Progress Note ?Patient Details:  ?[name] ?July 24, 1966 ?[id number] ? ?Patient ID: [name], male   DOB: 05/19/1966, 56 y.o.   MRN: [id number] ? ?[name] ?03/25/2022, 12:57 PM ?Stocking s ordered from outside vender. ?

## 2022-03-25 NOTE — Consult Note (Signed)
WOC Nurse Consult Note: ?Reason for Consult: bilateral LE wounds, chronic, nonhealing. Patient known to our department from several (8) admissions for this problem since December 2022. Patient was seen in consultation by Orthopedics (Dr. Sharol Given) who ordered Unna's Boots. Patient has since declined Unna's Boots. Patient was seen in the outpatient [facility] on 03/12/22 by PA-C H. Stone III who has provided guidance for more conservative care. Those orders will be used in house for this admission. ?Wound type: Full thickness and partial thickness. ?Pressure Injury POA: N/A ?Measurement:See measurements from H. Stone's encounter on 03/12/22. ?Wound bed:red, moist ?Drainage (amount, consistency, odor) small serous ?Periwound: areas of intact, dry skin ?Dressing procedure/placement/frequency: LEs are to be washed with soap and water, rinse and patted dry. Areas of intact, dry skin are to e moisturized with house moisturizer (Sween 24). Wounds are to be covered with folded layers of xeroform gauze Kellie Simmering # 294) and topped with ABD pads, secured with Kerlix roll gauze wrapped from just below toes to just below knees and topped with 6-inch ACE bandages applied in a similar manner. While the outpatient [facility] used Coban self adherent wraps, I have elected to use ACE bandages as it has been patient's history to refuse dressing changes on occasion and I do not wish him to sustain a possible medical device related skin jury from unrelieved compression via the Coban wraps. Nursing will attempt to change daily.  ? ?I have discussed this POC with Dr. Florene Glen via [place]. ? ?[place] nursing team will not follow, but will remain available to this patient, the nursing and medical teams.  Please re-consult if needed. ?Thanks, ?Maudie Flakes, MSN, RN, [place], [place], CWON-AP, [place]  ?Pager# [phone]  ? ? ? ?  ?

## 2022-03-25 NOTE — Progress Notes (Signed)
OT Cancellation Note ? ?Patient Details ?Name: [name] ?MRN: [id number] ?DOB: [birth year] ? ? ?Cancelled Treatment:    Reason Eval/Treat Not Completed: Fatigue/lethargy limiting ability to participate ?Patient was in deep sleep this AM when checked on. This afternoon patient was able to say few words then fall back asleep. OT to continue to follow and check back when patient is more alert ?Serge Main OTR/L, MS ?Acute Rehabilitation Department ?Office# [phone] ?Pager# [phone] ? ?03/25/2022, 1:54 PM ?

## 2022-03-25 NOTE — Progress Notes (Signed)
Patient has been brought to the [facility] department several times, each time refusing exams. Pt would not get onto MRI table last night again refusing. Spoke with RN to cancel exams.  ?

## 2022-03-25 NOTE — Progress Notes (Signed)
?PROGRESS NOTE ? ? ? ?[name]  [id number] DOB: [birth year] DOA: 03/23/2022 ?PCP: Marliss Coots, NP  ?Chief Complaint  ?Patient presents with  ? Suicidal  ? Shortness of Breath  ? Anxiety  ? ? ?Brief Narrative:  ?[name] is [name] 56 y.o. male with medical history significant of stroke with residual dysarthria/right sided weakness (wheelchair bound), HFrEF (EF <20%), CAD, T2DM (diet controlled), cocaine abuse, schizoaffective disorder and multiple other medical problems presenting with bilateral lower extremity pain and swelling. ? ?Imaging currently pending.  Treating for HF exacerbation and possible infection.   ? ?See below for additional details  ? ? ?Assessment & Plan: ?  ?Principal Problem: ?  Bilateral lower extremity pain ?Active Problems: ?  Heart failure with reduced ejection fraction and diastolic dysfunction ([place]) ?  Swelling of lower extremity ?  SIRS (systemic inflammatory response syndrome) (HCC) ?  COPD with acute exacerbation ([place]) ?  Major depressive disorder, recurrent episode ([place]) ?  History of stroke ?  Alcohol use disorder, mild, abuse ?  Cocaine abuse ([place]) ?  Seizure ([place]) ?  Homelessness ?  Elevated troponin ?  QT prolongation ?  Acute exacerbation of CHF (congestive heart failure) ([place]) ?  Dysarthria ?  Substance use disorder ?  Leg swelling ? ? ?Assessment and Plan: ?* Bilateral lower extremity pain ?Bilateral LE edema with extensive LE wounds worse on R ?Pain seems out of proportion to exam ?Sed rate 63, CRP pending ?Plain films negative for osteo.  He had CT scans for his bilateral LE's back in December which showed cellulitis vs lymphedema. ?MRI R foot with mildly heterogenous signal of proximal phalanx of 2nd digit and proximal phalanx of 5th digit, nonspecific.  Skin irregularity about dorsal/lateral aspect of 5th digit without evidence of adjacent drainable fluid collection or abscess. ?MRI R ankle without suspicion for osteo. ?Pending L foot and ankle MRI ?Discussed  with Dr. Sharol Given who felt legs looked consistent with venous swelling and recommended compression stockings (not sure he'll tolerate placement of this with pain) and outpatient follow up with him ?Treating with diuretics and abx for possibility of superimposed infection ?Increase gabapentin.  Allergies listed to ibuprofen and APAP.  Will continue oxycodone and prn dilaudid for now. ? ?Heart failure with reduced ejection fraction and diastolic dysfunction ([place]) ?Elevated BNP ?LE swelling ?CXR with cardiomegaly ?HF exacerbation ?Lasix BID, spironolactone, hydralazine, imdur.  No entresto per cards given nonadherence. ?Will hold coreg with recent cocaine use (cards recommending resuming coreg once euvolemic) ?Strict I/O, daily weigths ?Echo 01/2022 with EF <20%, grade III diastolic dysfunction, mildly reduced RVSF ?Will consult cards - recommending diuretics and transition to oral ? ?SIRS (systemic inflammatory response syndrome) (HCC) ?tachypneic and tachycardia - no white count or fever.  Swollen legs bilaterally, following imaging.  Clinically doesn't appear septic. ?D dimer is elevated, follow CT PE protocol ? ?COPD with acute exacerbation ([place]) ?improved ?Wheezing improved ?Will try short steroid course as suspect this represents mild exacerbation ? ? ?Major depressive disorder, recurrent episode ([place]) ?Notes SI ?Psychiatry recommending resume abilify (will clarify as home meds have zyprexa, but not abilify), doesn't meat criteria for involuntary commitment ?D/c suicide sitter ? ?History of stroke ?Residual dysarthria  ?Continue statin, should be on aspirin as well will restart this (allergy noted in chart, but he was on aspirin last year, warning I think is related to ibuprofen) ? ?Cocaine abuse ([place]) ?Encourage cessation ?Avoid beta blocker for now ? ?Alcohol use disorder, mild, abuse ?Denies regular use, though  unclear how reliable he is ?CIWA ? ?Seizure ([place]) ?keppra ? ?Homelessness ?TOC c/s ? ?Elevated  troponin ?Mild, likely demand ? ?QT prolongation ?Repeat EKG ? ? ?DVT prophylaxis: ;pvempx ?Code Status: full ?Family Communication: none ?Disposition:  ? ?Status is: Inpatient ?Remains inpatient appropriate because: need for IV abx ?  ?Consultants:  ?Cardiology ? ?Procedures:  ?none ? ?Antimicrobials:  ?Anti-infectives (From admission, onward)  ? ? Start     Dose/Rate Route Frequency Ordered Stop  ? 03/25/22 0600  vancomycin (VANCOCIN) IVPB 1000 mg/200 mL premix       ? 1,000 mg ?200 mL/hr over 60 Minutes Intravenous Every 12 hours 03/24/22 1748 03/31/22 1759  ? 03/24/22 2300  vancomycin (VANCOCIN) IVPB 1000 mg/200 mL premix  Status:  Discontinued       ? 1,000 mg ?200 mL/hr over 60 Minutes Intravenous Every 12 hours 03/24/22 1039 03/24/22 1748  ? 03/24/22 1100  vancomycin (VANCOCIN) IVPB 1000 mg/200 mL premix  Status:  Discontinued       ? 1,000 mg ?200 mL/hr over 60 Minutes Intravenous  Once 03/24/22 1011 03/24/22 1039  ? 03/24/22 1100  vancomycin (VANCOREADY) IVPB 2000 mg/400 mL       ? 2,000 mg ?200 mL/hr over 120 Minutes Intravenous  Once 03/24/22 1039 03/24/22 1921  ? 03/24/22 1030  cefTRIAXone (ROCEPHIN) 2 g in sodium chloride 0.9 % 100 mL IVPB       ? 2 g ?200 mL/hr over 30 Minutes Intravenous Daily 03/24/22 1011 03/31/22 0959  ? ?  ? ? ?Subjective: ?Continued pain ? ?Objective: ?Vitals:  ? 03/24/22 1525 03/24/22 2017 03/25/22 0528 03/25/22 0819  ?BP: (!) 143/90 124/74 (!) 128/94   ?Pulse: (!) 103 97 96   ?Resp: 19 18 20    ?Temp: 98.1 ?F (36.7 ?C) 97.9 ?F (36.6 ?C) (!) 97.2 ?F (36.2 ?C)   ?TempSrc: Oral  Oral   ?SpO2: 99% 100% 97% 95%  ?Weight:   84.9 kg   ?Height:      ? ? ?Intake/Output Summary (Last 24 hours) at 03/25/2022 1210 ?Last data filed at 03/25/2022 1145 ?Gross per 24 hour  ?Intake 1545.1 ml  ?Output 5275 ml  ?Net -3729.9 ml  ? ?Filed Weights  ? 03/23/22 1329 03/24/22 0500 03/25/22 0528  ?Weight: 94 kg 87.9 kg 84.9 kg  ? ? ?Examination: ? ?General: No acute distress. ?Cardiovascular: RRR ?Lungs:  unlabored ?Neurological: alert, dysarthric, R sided weakness ?Extremities: improving LE edema, persistent wounds - ttp out of proportion to exam ? ? ?Data Reviewed: I have personally reviewed following labs and imaging studies ? ?CBC: ?Recent Labs  ?Lab 03/20/22 ?1306 03/23/22 ?1450 03/24/22 ?0807  ?WBC 7.7 7.8 8.9  ?NEUTROABS 4.3 4.9  --   ?HGB 12.4* 14.2 12.0*  ?HCT 36.1* 44.0 36.4*  ?MCV 92.8 95.7 94.5  ?PLT 404* 520* 433*  ? ? ?Basic Metabolic Panel: ?Recent Labs  ?Lab 03/20/22 ?1306 03/23/22 ?1450 03/24/22 ?0807  ?NA 134* 139 137  ?K 4.4 4.1 4.6  ?CL 103 103 103  ?CO2 21* 26 24  ?GLUCOSE 93 84 103*  ?BUN 13 19 21*  ?CREATININE 1.15 1.14 1.17  ?CALCIUM 8.3* 9.0 8.6*  ? ? ?GFR: ?Estimated Creatinine Clearance: 77.5 mL/min (by C-G formula based on SCr of 1.17 mg/dL). ? ?Liver Function Tests: ?Recent Labs  ?Lab 03/20/22 ?1306 03/23/22 ?1450 03/24/22 ?0807  ?AST 20 26 18   ?ALT 20 21 17   ?ALKPHOS 92 125 103  ?BILITOT 1.3* 1.0 1.1  ?PROT 6.2* 7.6 6.4*  ?ALBUMIN  2.6* 3.4* 2.7*  ? ? ?CBG: ?No results for input(s): GLUCAP in the last 168 hours. ? ? ?Recent Results (from the past 240 hour(s))  ?Culture, blood (Routine x 2)     Status: None  ? Collection Time: 03/20/22  1:06 PM  ? Specimen: BLOOD  ?Result Value Ref Range Status  ? Specimen Description BLOOD LEFT ANTECUBITAL  Final  ? Special Requests AEROBIC BOTTLE ONLY Blood Culture adequate volume  Final  ? Culture   Final  ?  NO GROWTH 5 DAYS ?Performed at [facility], [place] [address] [place], [place] [address] ?  ? Report Status 03/25/2022 FINAL  Final  ?Culture, blood (Routine x 2)     Status: None  ? Collection Time: 03/20/22  1:06 PM  ? Specimen: BLOOD  ?Result Value Ref Range Status  ? Specimen Description BLOOD RIGHT ANTECUBITAL  Final  ? Special Requests   Final  ?  BOTTLES DRAWN AEROBIC AND ANAEROBIC Blood Culture adequate volume  ? Culture   Final  ?  NO GROWTH 5 DAYS ?Performed at [facility], [place] [address], [place], [place] [address] ?  ?  Report Status 03/25/2022 FINAL  Final  ?  ? ? ? ? ? ?Radiology Studies: ?DG Chest 2 View ? ?Result Date: 03/23/2022 ?CLINICAL DATA:  Shortness of breath. EXAM: CHEST - 2 VIEW COMPARISON:  AP chest 03/20/2022 CT chest 04/

## 2022-03-25 NOTE — Plan of Care (Signed)
Pt continues to to irritable and non-compliant this shift. Pt stated he would get his MRI done, pre-med given, but pt refused while there. Pt does not want his legs touched by staff, and will refuse oral meds at times. Ativan given x2 this shift. ?Problem: Education: ?Goal: Knowledge of General Education information will improve ?Description: Including pain rating scale, medication(s)/side effects and non-pharmacologic comfort measures ?Outcome: Progressing ?  ?Problem: Coping: ?Goal: Level of anxiety will decrease ?Outcome: Progressing ?  ?Problem: Pain Managment: ?Goal: General experience of comfort will improve ?Outcome: Progressing ?  ?

## 2022-03-25 NOTE — Discharge Instructions (Addendum)
Outpatient Substance Use Treatment Services   ? ?  ? ?[facility]   ? ?Chemical Dependence Intensive Outpatient Program  ? ?[address]  ? ?[place], [name] [address]   ? ?[phone]  ? ?Private insurance, Medicare A&B, and GCCN   ? ?  ? ?[facility]   ? ?[address],   ? ?[place], [name] [address]  ? ?[phone]  ? ?Medicaid, Self Pay   ? ?  ? ?[facility]       ? ?[address]   ? ?[place], [name]  ? ?[phone]  ? ?Medicaid and Private Insurance, Self Pay   ? ?  ? ?[facility]  ? ?[address]   ? ?[place], [name]   ? ?[phone]  ? ?Private Insurance, and Self Pay  ? ?  ? ?[facility]       ? ?[address]     ? ?[place], [name] [address]   ? ?[phone] or [phone]  ? ?Private Insurance Only   ? ?  ? ?  ? ?  ? ?  ? ?  ? ?  ? ?  ? ?  ? ?  ? ?  ? ?  ? ?  ? ?  ? ?  ? ?  ? ?[facility]  ? ?[address]   ? [place] [address]  ? ?[phone]  ? ?Medicaid, Medicare, Private Insurance  ? ?  ? ?[facility] at the [employer]  ? ?[address]   ? ?[place], [name] [address]  ? ?[phone]  ? ?Services are free or reduced  ? ?  ? ?[facility]   ? ?[address]  ? ?[phone]   ? ?Self Pay only, sliding scale  ? ?  ? ?[facility]   ? ?[address]   ? ?[place], [name] [address]  ? ?[phone]  ? ?([facility])  ? ?Self Pay, Medicaid Only   ? ?  ? ?[facility]  ? ?[address]   ? ?[place], [name] [address]  ? ?[phone]  ? ?Medicaid, Medicare, Private Insurance  ? ?  ? ?  ? ?  ? ?  ? ?  ? ?  ? ?  ? ?  ? ?  ? ?  ? ?  ? ?  ? ?  ? ?  ? ?  ? ?  ? ?  ? ?  ? ?  ? ?  ? ?[facility]oles[facility]stance [facility] Treatment Services   ? ?  ? ?  ? ?[facility]  ? ?[address]   ? ?[place], [name]   ? ?[phone]  ? ?Self Pay  ? ?Offer scholarships from the [employer] to help pay for treatment   ? ?Website: http://www.lambert.com/  ? ?  ? ?[facility]  ? ?Adolescent Substance use Program  ? ?Males ages: 5-17  ? ?Adolescent Substance use Program  ? ?Females: 12-17  ? ?  ? ?[facility]  ? ?[address]  [place], [name] [address]  ? ?(ph) [phone]  (fax) [phone]  ? ?  ? ?[facility]   ? ?[address]  [place], [name] [address]  ? ?(ph) [phone]  (fax) [phone]  ? ?  ? ?[facility]  ? ?[address]  [place], [place] [address]  ? ?(ph) [phone]  (fax) [phone]  ? ?  ? ?[facility]  ? ?[address] [place], [place], [place] [address]  ? ?(ph) [phone]   (fax) [phone]  ? ?  ? ?Website: [email or web address]  ? ?  ? ?  ? ?  ? ?  ? ?  ? ?  ? ?  ? ?  ? ?[facility]  ? ?Substance Abuse Intensive Outpatient Program for Adolescents  ? ?Phone: [phone]  ? ?Address: [address] [place], [place]  ? ?Website: [email or web address]  ? ?  ?  ? ?  ? ?Residential Substance Use Treatment Services   ? ?  ? ?[facility]   ? ?[place]   ? ?[place], [place] [address]   ? ?[phone] or [phone]  ? ?Detox (Medicare, Medicaid, private insurance, and self pay)   ? ?Residential Rehab 14 days (Medicare, Medicaid, private insurance, and self pay)   ? ?  ? ?[facility]   ? ?[place], [place]   ? ?[phone]   ? ?Male and Male Detox (Self Pay and Medicaid limited availability)   ? ?Rehab only Male (Medicaid and self pay only)   ? ?  ? ?[facility]       ? ?[address]   ? ?[place], [place] [address]   ? ?[phone] or [phone]  ? ?Detox and Residential Treatment  ? ?Private Insurance Only   ? ?  ? ?[facility]   ? ?[address]   ? ?[place], [place] [address]    ? ?[phone]   ? ?Treatment Only, must make assessment appointment, and must be sober for assessment appointment.   ? ?Self Pay Only, Medicare A&B, [facility] [place] ID only!  ? ?*Transportation assistance offered from [place] on [employer]  ? ?  ? ?[facility]      ? ?[address]  ? ?[place], [place] [address]  ? ?Walk in interviews M-Sat 8-4p  ? ?No pending legal charges  ? [phone]  ? ?  ? ?  ? ?  ? ?  ? ?[facility]:  [facility]  ? ?Referral   ? ?[place]  ? ?[place], [place]  ? ?[phone]  ? ?(Self Pay, Medicaid)  ? ?  ? ?[facility]  ? ?[address]  ? ?[place], [place] [address]  ? ?[phone]  ? ?Detox and Residential Treatment  ? ?Medicare and Private Insurance  ? ?  ? ?[facility]  ? ?[address]   [place], [place] [address]  ? ?[place]: [phone]  ? ?[place]: [phone]  ? ?Long-term Residential Program:   ? ?[phone] Males 25 and Over  ? ?(No Insurance, upfront fee)  ? ?  ? ?[facility]   ? ?[address]  ? ?[place], [place] [address]  ? ?[phone]  ? ?Private Insurance with [place], Private Pay  ?  ? ?[facility]  ? ?[place]  ?[place], [place] [address]  ?Local?[phone]  ? ?Private Insurance Only  ? ?  ? ?[facility]  ? ?[place]   [place],  [address]   ? ?[phone]  ? ?(Males, upfront fee)  ? ?  ? ?[facility]  ? ?[address]   ? ?[place], [place]  ? ?[phone]  ? ?Private Insurance   ? ?  [employer] Locations  ? ?  ? ?[facility]   ? ?[place]   ? ?[place], [place]   ? ?[phone]  ? ?[facility] Based Program for individuals experiencing homelessness  ? ?Self Pay, No insurance  ? ?  ? ?[facility]   ? ?Men?s program: [place]  ? ?[place]   ? [place], [place] [address]  ? ?[phone]  ? ?  ? ?[facility]  ? ?Women?s program: [employer]  ? ?[address]  ?[place], [place] [address]  ? ?[phone]   ? ?Christian Based Program for individuals experiencing homelessness  ? ?Self Pay, No insurance  ? ?  ? ?[facility]  ? ?[place]   ? [place], [place] [address]   ? ?[phone]  ? ?Christian Based Program for individuals experiencing homelessness  ? ?Self Pay, No insurance  ? ?  ? ?[facility]  ? ?[address]   ? [place], [place] [address]  ? ?[phone]  ? ?Christian Based Program for individuals experiencing homelessness  ? ?Self Pay, No insurance  ? ?  ? ?[facility]  ? ?[place], [place]  ? ?[phone]  ? ?Christian Based Program for males experiencing homelessness  ? ?Self Pay, No insurance  ? ?Information on my medicine - ELIQUIS? (apixaban) ? ?This medication education was reviewed with me or my healthcare representative as part of my discharge preparation.  The pharmacist that spoke with me during my hospital stay was:  ?Kala, [place], [facility] ? ?Why was Eliquis? prescribed for you? ?Eliquis? was prescribed to treat blood clots that may have been found in the veins of your legs (deep vein thrombosis) or in your lungs (pulmonary embolism) and to reduce the risk of them occurring again. ? ?What do You need to know about Eliquis? ? ?The starting dose is 10 mg (two 5 mg tablets) taken TWICE daily for the FIRST SEVEN (7) DAYS, then on (enter date)  May 8th  the dose is reduced to ONE 5 mg tablet taken TWICE daily.  Eliquis? may be taken with or without food.  ? ?Try to take the dose about the same time in the morning and in the evening. If you have difficulty swallowing the tablet whole please discuss with your pharmacist how to take the medication safely. ? ?Take Eliquis? exactly as prescribed and DO NOT stop taking Eliquis? without talking to the doctor who prescribed the medication.  Stopping may increase your risk of developing a new blood clot.  Refill your prescription before you run out. ? ?After discharge, you should have  regular check-up appointments with your healthcare provider that is prescribing your Eliquis?. ?   ?What do you do if you miss a dose? ?If a dose of ELIQUIS? is not taken at the scheduled time, take it as soon as

## 2022-03-25 NOTE — TOC Initial Note (Addendum)
Transition of Care (TOC) - Initial/Assessment Note  ? ? ?Patient Details  ?Name: [name] ?MRN: [id number] ?Date of Birth: 06-28-1966 ? ?Transition of Care (TOC) CM/SW Contact:    ?Ross Ludwig, [name] ?Phone Number: ?03/25/2022, 10:23 AM ? ?Clinical Narrative:                 ? ?Patient is well know to [facility] team.  Patient is homeless, has history of substance abuse, and has history of noncompliance on medications.  Patient has been worked up for a SNF in the past, but they refuse to accept him.  CSW worked patient up for SNF again, waiting for bed offers.  Patient is given substance abuse resources everytime but he does not follow up.  CSW will add substance abuse resources to AVS again, but patient typically does not want to stop using.  Patient does sometimes follow up with psych outpatient, but most time he does not.  Patient has a wheelchair, and has had a walker in the past.  Patient is not eligible for any other equipment.  CSW to continue to follow patient's progress throughout discharge planning. ? ?12:30pm  Passar level 2 screen, QMHP assigned. ? ?Expected Discharge Plan: [place] ?Barriers to Discharge: Continued Medical Work up ? ? ?Patient Goals and CMS Choice ?Patient states their goals for this hospitalization and ongoing recovery are:: To go to SNF ?  ?  ? ?Expected Discharge Plan and Services ?Expected Discharge Plan: [place] ?In-house Referral: Clinical Social Work ?  ?  ?Living arrangements for the past 2 months: No permanent address, Hotel/Motel, [place], Homeless ?                ?  ?  ?  ?  ?  ?  ?  ?  ?  ?  ? ?Prior Living Arrangements/Services ?Living arrangements for the past 2 months: No permanent address, Hotel/Motel, [place], Homeless ?Lives with:: Self ?Patient language and need for interpreter reviewed:: Yes ?Do you feel safe going back to the place where you live?: Yes      ?Need for Family Participation in Patient Care: No  (Comment) ?Care giver support system in place?: No (comment) ?  ?Criminal Activity/Legal Involvement Pertinent to Current Situation/Hospitalization: No - Comment as needed ? ?Activities of Daily Living ?Home Assistive Devices/Equipment: Wheelchair (at bedside) ?ADL Screening (condition at time of admission) ?Patient's cognitive ability adequate to safely complete daily activities?: Yes ?Is the patient deaf or have difficulty hearing?: No ?Does the patient have difficulty seeing, even when wearing glasses/contacts?: No ?Does the patient have difficulty concentrating, remembering, or making decisions?: Yes ?Patient able to express need for assistance with ADLs?: Yes ?Does the patient have difficulty dressing or bathing?: Yes ?Independently performs ADLs?: Yes (appropriate for developmental age) ?Does the patient have difficulty walking or climbing stairs?: Yes ?Weakness of Legs: Both ?Weakness of Arms/Hands: Both ? ?Permission Sought/Granted ?Permission sought to share information with : Facility Sport and exercise psychologist, Case Manager ?Permission granted to share information with : Yes, Release of Information Signed ? Share Information with NAME: Krystal Clark [phone]  [phone] ? Permission granted to share info w AGENCY: SNF admissions ?   ?   ? ?Emotional Assessment ?Appearance:: Appears stated age ?Attitude/Demeanor/Rapport: Angry, Hostile ?Affect (typically observed): Accepting, Adaptable, Angry, Agitated, Defensive ?Orientation: : Oriented to Self ?Alcohol / Substance Use: Illicit Drugs ?Psych Involvement: Yes (comment) ? ?Admission diagnosis:  Suicidal ideation [R45.851] ?Wound of right leg, initial encounter [S81.801A] ?Swelling of  lower extremity [M79.89] ?Leg swelling [M79.89] ?Patient Active Problem List  ? Diagnosis Date Noted  ? Swelling of lower extremity 03/23/2022  ? SIRS (systemic inflammatory response syndrome) ([place]) 03/23/2022  ? Leg swelling 03/23/2022  ? Acute on chronic systolic  heart failure ([place]) 02/26/2022  ? Seizure ([place]) 02/26/2022  ? Venous stasis ulcer of right ankle limited to breakdown of skin with varicose veins (HCC)   ? Hypertensive urgency 01/29/2022  ? Prolonged QT interval 01/29/2022  ? Alcohol abuse 01/29/2022  ? chronic ulcers 01/29/2022  ? Wound infection 12/04/2021  ? Open wounds involving multiple regions of lower extremity 12/03/2021  ? Mixed diabetic hyperlipidemia associated with type 2 diabetes mellitus ([place]) 12/03/2021  ? Cellulitis of right lower extremity 11/13/2021  ? Type 2 diabetes mellitus with diabetic polyneuropathy, without long-term current use of insulin ([place]) 11/13/2021  ? Antisocial personality disorder ([place]) 11/04/2021  ? Malingering 11/04/2021  ? Foot pain 04/23/2021  ? Chronic combined systolic and diastolic congestive heart failure ([place]) 03/02/2021  ? History of stroke 03/02/2021  ? Substance use disorder 03/02/2021  ? Skin ulcer of right great toe with necrosis of bone ([place])   ? Encounter for assessment of healthcare decision-making capacity   ? Pain, dental   ? Dystonia   ? Dysarthria   ? Shock liver 01/04/2021  ? Acute metabolic encephalopathy [id number]  ? Coronary artery disease involving native coronary artery of native heart without angina pectoris 01/04/2021  ? Bipolar I disorder ([place]) 01/04/2021  ? Cocaine abuse ([place]) 01/04/2021  ? Homelessness 01/04/2021  ? HLD (hyperlipidemia) 01/04/2021  ? Osteomyelitis of great toe ([place])   ? Ulcer of great toe, left, with necrosis of bone ([place])   ? Ulcer of great toe, right, with necrosis of muscle ([place])   ? Acute osteomyelitis of toe, left ([place])   ? Acute on chronic combined systolic and diastolic CHF (congestive heart failure) ([place]) 12/20/2020  ? Respiratory failure ([place])   ? Ischemic stroke ([place]) 12/17/2020  ? 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 Diabetic foot ulcer ([place]) 12/08/2020  ? CHF (congestive heart failure) ([place]) 11/14/2020  ? CHF exacerbation ([place]) 11/13/2020  ? Hypokalemia   ?  Hyperlipidemia   ? Acute respiratory failure with hypoxia ([place]) 10/26/2020  ? COPD with acute exacerbation ([place]) 10/14/2020  ? Acute on chronic combined systolic and diastolic CHF (congestive heart failure) ([place]) 10/02/2020  ? Heart failure with reduced ejection fraction and diastolic dysfunction ([place]) 09/07/2020  ? Near syncope 07/21/2020  ? Schizoaffective disorder ([place]) 06/24/2019  ? SOB (shortness of breath)   ? QT prolongation 05/25/2019  ? Cocaine abuse ([place])   ? Elevated troponin   ? Homeless   ? Suicidal ideation   ? Bilateral lower extremity pain 04/05/2019  ? Housing situation unstable 03/28/2019  ? Bilateral lower extremity edema   ? Acute exacerbation of CHF (congestive heart failure) ([place]) 03/04/2019  ? Asthma 12/26/2016  ? Tobacco use disorder 12/26/2016  ? Schizophrenia ([place]) 12/26/2016  ? Cocaine abuse with cocaine-induced mood disorder ([place]) 09/06/2016  ? Alcohol use disorder, mild, abuse 07/31/2016  ? Substance induced mood disorder ([place]) 07/30/2016  ? Schizoaffective disorder, bipolar type ([place]) 05/31/2016  ? Cocaine use disorder, severe, dependence ([place]) 05/31/2016  ? Major depressive disorder, recurrent episode ([place]) 05/30/2016  ? ?PCP:  Marliss Coots, NP ?Pharmacy:   ?[facility] [address] ?[address] ?[place] [address] ?Phone: [phone] Fax: [phone] ? ?[facility] ?[address] ?[place] [address] ?Phone: [phone] Fax: [phone] ? ? ? ? ?Social Determinants of Health (SDOH) Interventions ?  ? ?Readmission Risk Interventions ? ?  02/04/2022  ?  4:41 PM 12/05/2021  ? 11:48 AM 03/05/2021  ?  2:42 PM  ?Readmission Risk Prevention Plan  ?Transportation Screening Complete Complete Complete  ?Medication Review Press photographer) Complete Not Complete Complete  ?PCP or Specialist appointment within 3-5 days of discharge Complete Not Complete Complete  ?[place] or Home Care Consult Complete Not Complete  Complete  ?SW Recovery Care/Counseling Consult Complete Not Complete Complete  ?Palliative Care Screening Not Applicable Not Applicable Not Applicable  ?Skilled Nursing Facility Complete Not Applicable Complete  ? ? ? ?

## 2022-03-25 NOTE — TOC PASRR Note (Signed)
30 Day PASRR Note ? ? ?Patient Details  ?Name: [name] ?Date of Birth: [birth year] ? ? ?Transition of Care (TOC) CM/SW Contact:    ?Darleene Cleaver, [name] ?Phone Number: ?03/25/2022, 2:56 PM ? ?To Whom It May Concern: ? ?Please be advised that this patient will require a short-term nursing home stay - anticipated 30 days or less for rehabilitation and strengthening.   ?The plan is for return home.  ?

## 2022-03-25 NOTE — Progress Notes (Signed)
This NT and another NT was providing pt care to this patient. Patient proceeded to yell " I want to HANG ya'll with this sheet." This NT informed patient that the verbage is inappropriate to say toward staff. Patient proceeded to yell "Well it's true". NT escalated situation to CN  ?

## 2022-03-25 NOTE — Progress Notes (Signed)
Pt became aggressive toward Staff. Security called while pt in MRI. Pt singled me out telling me not to touch him and he told me I was also not allowed to touch MRI table Security came up to MRI area while pt was scanned.  ?

## 2022-03-26 DIAGNOSIS — I5043 Acute on chronic combined systolic (congestive) and diastolic (congestive) heart failure: Secondary | ICD-10-CM

## 2022-03-26 DIAGNOSIS — R778 Other specified abnormalities of plasma proteins: Secondary | ICD-10-CM

## 2022-03-26 DIAGNOSIS — M7989 Other specified soft tissue disorders: Secondary | ICD-10-CM

## 2022-03-26 DIAGNOSIS — R569 Unspecified convulsions: Secondary | ICD-10-CM

## 2022-03-26 DIAGNOSIS — R9431 Abnormal electrocardiogram [ECG] [EKG]: Secondary | ICD-10-CM

## 2022-03-26 DIAGNOSIS — M79604 Pain in right leg: Secondary | ICD-10-CM | POA: Diagnosis not present

## 2022-03-26 DIAGNOSIS — J441 Chronic obstructive pulmonary disease with (acute) exacerbation: Secondary | ICD-10-CM

## 2022-03-26 DIAGNOSIS — Z5900 Homelessness unspecified: Secondary | ICD-10-CM

## 2022-03-26 DIAGNOSIS — Z8673 Personal history of transient ischemic attack (TIA), and cerebral infarction without residual deficits: Secondary | ICD-10-CM

## 2022-03-26 DIAGNOSIS — S81801A Unspecified open wound, right lower leg, initial encounter: Secondary | ICD-10-CM

## 2022-03-26 LAB — CBC WITH DIFFERENTIAL/PLATELET
Abs Immature Granulocytes: 0.06 10*3/uL (ref 0.00–0.07)
Basophils Absolute: 0 10*3/uL (ref 0.0–0.1)
Basophils Relative: 0 %
Eosinophils Absolute: 0.1 10*3/uL (ref 0.0–0.5)
Eosinophils Relative: 0 %
HCT: 36.4 % — ABNORMAL LOW (ref 39.0–52.0)
Hemoglobin: 12.1 g/dL — ABNORMAL LOW (ref 13.0–17.0)
Immature Granulocytes: 1 %
Lymphocytes Relative: 20 %
Lymphs Abs: 2.5 10*3/uL (ref 0.7–4.0)
MCH: 31.9 pg (ref 26.0–34.0)
MCHC: 33.2 g/dL (ref 30.0–36.0)
MCV: 96 fL (ref 80.0–100.0)
Monocytes Absolute: 1 10*3/uL (ref 0.1–1.0)
Monocytes Relative: 8 %
Neutro Abs: 9.1 10*3/uL — ABNORMAL HIGH (ref 1.7–7.7)
Neutrophils Relative %: 71 %
Platelets: 403 10*3/uL — ABNORMAL HIGH (ref 150–400)
RBC: 3.79 MIL/uL — ABNORMAL LOW (ref 4.22–5.81)
RDW: 14.5 % (ref 11.5–15.5)
WBC: 12.7 10*3/uL — ABNORMAL HIGH (ref 4.0–10.5)
nRBC: 0 % (ref 0.0–0.2)

## 2022-03-26 LAB — COMPREHENSIVE METABOLIC PANEL
ALT: 13 U/L (ref 0–44)
AST: 12 U/L — ABNORMAL LOW (ref 15–41)
Albumin: 2.7 g/dL — ABNORMAL LOW (ref 3.5–5.0)
Alkaline Phosphatase: 87 U/L (ref 38–126)
Anion gap: 8 (ref 5–15)
BUN: 20 mg/dL (ref 6–20)
CO2: 26 mmol/L (ref 22–32)
Calcium: 8.3 mg/dL — ABNORMAL LOW (ref 8.9–10.3)
Chloride: 104 mmol/L (ref 98–111)
Creatinine, Ser: 1.03 mg/dL (ref 0.61–1.24)
GFR, Estimated: >60 mL/min (ref >60)
Glucose, Bld: 139 mg/dL — ABNORMAL HIGH (ref 70–99)
Potassium: 3.5 mmol/L (ref 3.5–5.1)
Sodium: 138 mmol/L (ref 135–145)
Total Bilirubin: 0.4 mg/dL (ref 0.3–1.2)
Total Protein: 6.5 g/dL (ref 6.5–8.1)

## 2022-03-26 LAB — PROCALCITONIN: Procalcitonin: 0.17 ng/mL

## 2022-03-26 LAB — PHOSPHORUS: Phosphorus: 2.9 mg/dL (ref 2.5–4.6)

## 2022-03-26 LAB — MAGNESIUM: Magnesium: 2.1 mg/dL (ref 1.7–2.4)

## 2022-03-26 MED ORDER — IPRATROPIUM-ALBUTEROL 0.5-2.5 (3) MG/3ML IN SOLN
3.0000 mL | Freq: Three times a day (TID) | RESPIRATORY_TRACT | Status: DC
  Administered 2022-03-27 – 2022-03-31 (×12): 3 mL via RESPIRATORY_TRACT
  Filled 2022-03-26 (×12): qty 3

## 2022-03-26 MED ORDER — POTASSIUM CHLORIDE CRYS ER 20 MEQ PO TBCR
40.0000 meq | EXTENDED_RELEASE_TABLET | Freq: Once | ORAL | Status: AC
  Administered 2022-03-26: 40 meq via ORAL
  Filled 2022-03-26: qty 2

## 2022-03-26 NOTE — Progress Notes (Signed)
Pt refused 1400 vitals 

## 2022-03-26 NOTE — Progress Notes (Addendum)
Pt became agitated when this RN said "you ate good today" after he stated he hadn't eaten anything. I told pt he had 5 meal trays and pt said "you a lie, I can't stand a lie". Pt said  "I don't want nothin' from you" when trying to give medicine. I asked him to be sure if he was refusing to take his medicine, he refused again and after leaving the room, he called out requesting to speak to a supervisor. Misha came to speak with pt and pt continued to be belligerent, complaining he had not received pain medicine, but he had been given oxycodone by this nurse. Pt continues to be agitated and refuse meds for reasons unknown. ?

## 2022-03-26 NOTE — Evaluation (Signed)
Occupational Therapy Evaluation ?Patient Details ?Name: [name] ?MRN: [id number] ?DOB: 1966/07/06 ?Today's Date: 03/26/2022 ? ? ?History of Present Illness [name] is a 56 y.o. male presenting with bilateral lower extremity pain and swelling. Pt currently struggling with homelessness and bystanding called 911 as pt was making SI comments while in park today. Pt brought to hospital for evaluation. PMH significant of stroke with residual dysarthria/right sided weakness (wheelchair bound), HFrEF (EF <20%), CAD, T2DM (diet controlled), cocaine abuse, schizoaffective disorder and multiple other medical problems.  ? ?Clinical Impression ?  ?Per chart review patient is homeless and uses wheelchair for mobility. Patient becomes agitated with PLOF questions reports it was hard to transfer to/from wheelchair "you asked all that already, nothing's changed." With encouragement patient willing to sit up at edge of bed needing min A for trunk support. Patient willing to try and stand with walker in order to change bed linens however feet sliding. Patient gets agitated at OT "you're not even helping!" And asks OT to leave. Patient left with PT sitting at edge of bed, was unable to stand with PT. Due to limited functional independence with self care and transfers recommend short term rehab at [facility].   ?   ? ?Recommendations for follow up therapy are one component of a multi-disciplinary discharge planning process, led by the attending physician.  Recommendations may be updated based on patient status, additional functional criteria and insurance authorization.  ? ?Follow Up Recommendations ? Skilled nursing-short term rehab (<3 hours/day)  ?  ?Assistance Recommended at Discharge Frequent or constant Supervision/Assistance  ?Patient can return home with the following A lot of help with walking and/or transfers;A lot of help with bathing/dressing/bathroom;Direct supervision/assist for medications management;Direct  supervision/assist for financial management;Assist for transportation;Help with stairs or ramp for entrance ? ?  ?Functional Status Assessment ? Patient has had a recent decline in their functional status and/or demonstrates limited ability to make significant improvements in function in a reasonable and predictable amount of time  ?Equipment Recommendations ? None recommended by OT  ?  ?   ?Precautions / Restrictions Precautions ?Precautions: Fall ?Precaution Comments: bil feet and lower leg wounds ?Restrictions ?Weight Bearing Restrictions: No  ? ?  ? ?Mobility Bed Mobility ?Overal bed mobility: Needs Assistance ?Bed Mobility: Supine to Sit ?  ?  ?Supine to sit: Min assist, HOB elevated ?  ?  ?General bed mobility comments: To elevate trunk upright ?  ? ?Transfers ?  ?  ?  ?  ?  ?  ?  ?  ?  ?General transfer comment: deferred 2* agitation ?  ? ?  ?Balance Overall balance assessment: Needs assistance ?Sitting-balance support: Feet supported ?Sitting balance-Leahy Scale: Fair ?  ?  ?  ?  ?  ?  ?  ?  ?  ?  ?  ?  ?  ?  ?  ?  ?   ? ?ADL either performed or assessed with clinical judgement  ? ?ADL Overall ADL's : Needs assistance/impaired ?Eating/Feeding: Independent ?  ?Grooming: Set up;Sitting ?  ?Upper Body Bathing: Set up;Sitting ?  ?Lower Body Bathing: Moderate assistance;Sitting/lateral leans;Bed level ?  ?Upper Body Dressing : Set up;Sitting ?  ?Lower Body Dressing: Moderate assistance;Sitting/lateral leans;Bed level ?  ?  ?Toilet Transfer Details (indicate cue type and reason): Deferred, attempted to stand at edge of bed however feet sliding. Patient becomes agitated when he feels OT not helping ?Toileting- Clothing Manipulation and Hygiene: Total assistance;Bed level ?  ?  ?  ?  ?  General ADL Comments: Unsure of patient's baseline, however due to patient being homeless anticipate he was able to perform basic self care tasks  ? ? ? ? ?Pertinent Vitals/Pain Pain Assessment ?Pain Assessment: Faces ?Faces Pain  Scale: Hurts even more ?Pain Location: LEs/ feet with bed mobility ?Pain Descriptors / Indicators: Grimacing ?Pain Intervention(s): Premedicated before session  ? ? ? ?Hand Dominance Right ?  ?Extremity/Trunk Assessment Upper Extremity Assessment ?Upper Extremity Assessment:  (Difficult to assess 2* agitation.) ?  ?Lower Extremity Assessment ?Lower Extremity Assessment: Defer to PT evaluation ?  ?  ?  ?Communication Communication ?Communication: Expressive difficulties (mumbled, slurred speech) ?  ?Cognition Arousal/Alertness: Awake/alert ?Behavior During Therapy: Agitated ?Overall Cognitive Status: No family/caregiver present to determine baseline cognitive functioning ?  ?  ?  ?  ?  ?  ?  ?  ?  ?  ?  ?  ?  ?  ?  ?  ?General Comments: Patient becomes agitated with questions and when he feels OT "is just standing there" ?  ?  ?   ?   ?   ? ? ?Home Living Family/patient expects to be discharged to:: Shelter/Homeless ?  ?  ?  ?  ?  ?  ?  ?  ?  ?  ?  ?  ?  ?  ?  ?  ?Additional Comments: Pt difficult to understand PLOF obtained from chart review as well as pt report ?  ? ?  ?Prior Functioning/Environment Prior Level of Function : Needs assist ?  ?  ?  ?  ?  ?  ?Mobility Comments: Patient becomes agitated when asking PLOF questions "I answered all this before, nothing's changed" reports having difficulty getting in/out of wheelchair ?  ?  ? ?  ?  ?OT Problem List: Decreased activity tolerance;Impaired balance (sitting and/or standing);Decreased safety awareness;Pain ?  ?   ?OT Treatment/Interventions: Self-care/ADL training;Balance training;Patient/family education;Therapeutic activities  ?  ?OT Goals(Current goals can be found in the care plan section) Acute Rehab OT Goals ?Patient Stated Goal: "You've made me mad!" ?OT Goal Formulation: With patient ?Time For Goal Achievement: 04/09/22 ?Potential to Achieve Goals: Fair  ?OT Frequency: Min 2X/week ?  ? ?Co-evaluation PT/OT/SLP Co-Evaluation/Treatment: Yes ?Reason for  Co-Treatment: To address functional/ADL transfers ?PT goals addressed during session: Mobility/safety with mobility ?OT goals addressed during session: ADL's and self-care ?  ? ?  ?AM-PAC OT "6 Clicks" Daily Activity     ?Outcome Measure Help from another person eating meals?: None ?Help from another person taking care of personal grooming?: A Little ?Help from another person toileting, which includes using toliet, bedpan, or urinal?: Total ?Help from another person bathing (including washing, rinsing, drying)?: A Lot ?Help from another person to put on and taking off regular upper body clothing?: A Little ?Help from another person to put on and taking off regular lower body clothing?: A Lot ?6 Click Score: 15 ?  ?End of Session Nurse Communication: Mobility status ? ?Activity Tolerance: Treatment limited secondary to agitation ?Patient left: in bed;Other (comment) (PT present) ? ?OT Visit Diagnosis: Other abnormalities of gait and mobility (R26.89)  ?              ?Time: 0865-7846 ?OT Time Calculation (min): 10 min ?Charges:  OT General Charges ?$OT Visit: 1 Visit ?OT Evaluation ?$OT Eval Low Complexity: 1 Low ? ?Marlyce Huge OT ?OT pager: [phone] ? ?Carmelia Roller ?03/26/2022, 9:26 AM ?

## 2022-03-26 NOTE — Progress Notes (Signed)
?   03/26/22 5027  ?Notify: Provider  ?Provider Name/Title Chinita Greenland, NP  ?Date Provider Notified 03/26/22  ?Time Provider Notified [phone]  ?Notification Type Call  ?Notification Reason Red med refusal  ?Provider response No new orders  ?Date of Provider Response 03/26/22  ?Time of Provider Response [phone]  ? ? ?

## 2022-03-26 NOTE — Progress Notes (Signed)
[facility] ? ?PROGRESS NOTE ? ?[name] [id number] DOB: 1966-09-03 DOA: 03/23/2022 ?PCP: Marliss Coots, NP ? ? ?Brief HPI:   ?56 year old male with medical history of stroke with residual dysarthria/right-sided weakness, wheelchair-bound, HFrEF with a EF less than 20%, CAD, diabetes mellitus type 2, cocaine abuse, schizoaffective disorder, presented with bilateral lower extremity pain and swelling.  Patient also was seen by cardiology for CHF exacerbation. ? ? ? ?Subjective  ? ?Patient seen and examined, complains of pain in lower extremities.  Denies shortness of breath. ? ? Assessment/Plan:  ? ? ?Bilateral lower extremity pain ?-Patient has bilateral lower extremity edema with extensive lower extremity wounds worse on right  ?-Plan for lumbar negative for osteomyelitis. ?-Imaging study done in December with CT scans of lower extremities showed cellulitis versus lymphedema ?-MRI right foot obtained on 03/23/2022 showed mildly heterogeneous signal of proximal phalanx of second digit and proximal phalanx of the fifth digit, nonspecific.  Skin irregularity about dorsal/lateral aspect of the fifth digit without evidence of adjacent drainable fluid collection or abscess. ?-MRI right ankle without suspicion for osteomyelitis. ?-MRI of left foot shows bone marrow edema of the proximal phalanx of second digit with surrounding soft tissue edema.  If there is open wound around the second digit this could represent osteomyelitis, in the absence of deep skin wound this could be due to edema due to altered mechanics due to amputation of the first digit.   ?-Called and discussed with Dr. Sharol Given, he will see patient. ?-In the meantime continue gabapentin, oxycodone and Dilaudid as needed ?-Continue vancomycin and ceftriaxone ? ?Chronic combined systolic and diastolic CHF ?-BNP was elevated ?-Also had lower extremity swelling so cardiology was consulted ?-Cardiology transition IV Lasix to p.o. and signed  off ?-Continue Lasix 40 mg p.o. twice daily ?-Continue Lasix, hydralazine, Imdur, Aldactone.  No Entresto per cardiology given nonadherence ?-Echocardiogram from 3/23 showed EF less than 123456, grade 3 diastolic dysfunction  ? ?Elevated D-dimer ?-D-dimer was elevated 3.22 ?-CTA chest ordered to rule out PE ? ?COPD with acute exacerbation ?-Improved ?-Patient was treated with prednisone taper ? ?Major depressive disorder ?-Patient had suicidal ideations ?-Psychiatry was consulted ?-Psychiatry recommended to resume Abilify ?-Bedside sitter was discontinued ? ?History of stroke ?-Patient has residual dysarthria ?-Continue statin, aspirin ? ?Cocaine abuse ?-Cessation encouraged ? ?History of seizures ?-Continue Keppra ? ?Homelessness ?-TOC consulted ? ?QTc prolongation ?-QTc interval 498 ms ?-Continue monitoring on telemetry ?-Keep serum potassium more than 4, serum magnesium greater than 2 ?-We will give K-Dur 40 mEq p.o. x1 ? ? ? ? ? ?Medications ? ?  ? ARIPiprazole  5 mg Oral QHS  ? aspirin  81 mg Oral Daily  ? atorvastatin  40 mg Oral Daily  ? enoxaparin (LOVENOX) injection  40 mg Subcutaneous [id number]  ? folic acid  1 mg Oral Daily  ? furosemide  40 mg Oral BID  ? gabapentin  800 mg Oral TID  ? hydrALAZINE  25 mg Oral Q8H  ? ipratropium-albuterol  3 mL Nebulization Q6H  ? isosorbide mononitrate  60 mg Oral Daily  ? levETIRAcetam  500 mg Oral BID  ? multivitamin with minerals  1 tablet Oral Daily  ? polyethylene glycol  17 g Oral Daily  ? spironolactone  12.5 mg Oral Daily  ? thiamine  100 mg Oral Daily  ? Or  ? thiamine  100 mg Intravenous Daily  ? umeclidinium bromide  1 puff Inhalation Daily  ? ? ? Data Reviewed:  ? ?CBG: ? ?  No results for input(s): GLUCAP in the last 168 hours. ? ?SpO2: 99 % ?O2 Flow Rate (L/min): 2 L/min  ? ? ?Vitals:  ? 03/25/22 0528 03/25/22 [id number] 03/25/22 2209 03/26/22 0811  ?BP: (!) 128/94  (!) 146/93 (!) 142/85  ?Pulse: 96  100 (!) 105  ?Resp: 20  (!) 22 20  ?Temp: (!) 97.2 ?F (36.2 ?C)  99 ?F  (37.2 ?C) 98.2 ?F (36.8 ?C)  ?TempSrc: Oral   Oral  ?SpO2: 97% 95% 100% 99%  ?Weight: 84.9 kg     ?Height:      ? ? ? ? ?Data Reviewed: ? ?Basic Metabolic Panel: ?Recent Labs  ?Lab 03/20/22 ?1306 03/23/22 ?1450 03/24/22 ?0807 03/26/22 ?0532  ?NA 134* 139 137 138  ?K 4.4 4.1 4.6 3.5  ?CL 103 103 103 104  ?CO2 21* 26 24 26   ?GLUCOSE 93 84 103* 139*  ?BUN 13 19 21* 20  ?CREATININE 1.15 1.14 1.17 1.03  ?CALCIUM 8.3* 9.0 8.6* 8.3*  ?MG  --   --   --  2.1  ?PHOS  --   --   --  2.9  ? ? ?CBC: ?Recent Labs  ?Lab 03/20/22 ?1306 03/23/22 ?1450 03/24/22 ?0807 03/26/22 ?0532  ?WBC 7.7 7.8 8.9 12.7*  ?NEUTROABS 4.3 4.9  --  9.1*  ?HGB 12.4* 14.2 12.0* 12.1*  ?HCT 36.1* 44.0 36.4* 36.4*  ?MCV 92.8 95.7 94.5 96.0  ?PLT 404* 520* 433* 403*  ? ? ?LFT ?Recent Labs  ?Lab 03/20/22 ?1306 03/23/22 ?1450 03/24/22 ?0807 03/26/22 ?0532  ?AST 20 26 18  12*  ?ALT 20 21 17 13   ?ALKPHOS 92 125 103 87  ?BILITOT 1.3* 1.0 1.1 0.4  ?PROT 6.2* 7.6 6.4* 6.5  ?ALBUMIN 2.6* 3.4* 2.7* 2.7*  ? ?  ?Antibiotics: ?Anti-infectives (From admission, onward)  ? ? Start     Dose/Rate Route Frequency Ordered Stop  ? 03/25/22 0600  vancomycin (VANCOCIN) IVPB 1000 mg/200 mL premix       ? 1,000 mg ?200 mL/hr over 60 Minutes Intravenous Every 12 hours 03/24/22 1748 03/31/22 1759  ? 03/24/22 2300  vancomycin (VANCOCIN) IVPB 1000 mg/200 mL premix  Status:  Discontinued       ? 1,000 mg ?200 mL/hr over 60 Minutes Intravenous Every 12 hours 03/24/22 1039 03/24/22 1748  ? 03/24/22 1100  vancomycin (VANCOCIN) IVPB 1000 mg/200 mL premix  Status:  Discontinued       ? 1,000 mg ?200 mL/hr over 60 Minutes Intravenous  Once 03/24/22 1011 03/24/22 1039  ? 03/24/22 1100  vancomycin (VANCOREADY) IVPB 2000 mg/400 mL       ? 2,000 mg ?200 mL/hr over 120 Minutes Intravenous  Once 03/24/22 1039 03/24/22 1921  ? 03/24/22 1030  cefTRIAXone (ROCEPHIN) 2 g in sodium chloride 0.9 % 100 mL IVPB       ? 2 g ?200 mL/hr over 30 Minutes Intravenous Daily 03/24/22 1011 03/31/22 0959  ? ?   ? ? ? ?DVT prophylaxis: Lovenox ? ?Code Status: Full code ? ?Family Communication: No family at bedside ? ? ?CONSULTS cardiology ? ? ?Objective  ? ? ?Physical Examination: ? ?General-appears in no acute distress ?Heart-S1-S2, regular, no murmur auscultated ?Lungs-clear to auscultation bilaterally, no wheezing or crackles auscultated ?Abdomen-soft, nontender, no organomegaly ?Extremities-trace edema in the lower extremities, both lower extremities in dressing ?Neuro-alert, oriented x3, no focal deficit noted ? ? ?Status is: Inpatient: Bilateral lower extremity wounds, CHF exacerbation ? ? ? ?  ? ? ?[name] ?  ?  [facility] ?If 7PM-7AM, please contact night-coverage at [email or web address], ?Office  [phone] ? ? ?03/26/2022, 1:59 PM  LOS: 3 days  ? ? ? ? ? ? ? ? ? ? ?  ?

## 2022-03-26 NOTE — Progress Notes (Signed)
? ?Physical Therapy Treatment ?Patient Details ?Name: [name] ?MRN: [id number] ?DOB: 1966-05-30 ?Today's Date: 03/26/2022 ? ? ?History of Present Illness Talmage Horry is a 56 y.o. male presenting with bilateral lower extremity pain and swelling and wounds. Pt currently struggling with homelessness and bystanding called 911 as pt was [place] SI comments [place] in park . Pt brought to hospital for evaluation on 03/23/22. PMH significant of stroke with residual dysarthria/right sided weakness (wheelchair bound), HFrEF (EF <20%), CAD, T2DM (diet controlled), cocaine abuse, schizoaffective disorder and multiple other medical problems. ? ?  ?PT Comments  ? ? Patient   with agitation   throughout attempts for therapy. Patient did  sit up onto bed edge and attempted to stand x 2 at [facility]. But did not get up  to stand. Continue efforts for mobility to [facility] as this was patient's baseline.   ?Recommendations for follow up therapy are one component of a multi-disciplinary discharge planning process, led by the attending physician.  Recommendations may be updated based on patient status, additional functional criteria and insurance authorization. ? ?Follow Up Recommendations ? Skilled nursing-short term rehab (<3 hours/day) ?  ?  ?Assistance Recommended at Discharge Frequent or constant Supervision/Assistance  ?Patient can return home with the following Help with stairs or ramp for entrance;Assist for transportation;Assistance with cooking/housework;Direct supervision/assist for medications management;Two people to help with walking and/or transfers;A lot of help with bathing/dressing/bathroom ?  ?Equipment Recommendations ? None recommended by PT  ?  ?Recommendations for Other Services   ? ? ?  ?Precautions / Restrictions Precautions ?Precautions: Fall ?Precaution Comments: bil feet and lower leg wounds ?Restrictions ?Weight Bearing Restrictions: No  ?  ? ?Mobility ? Bed Mobility ?  ?Bed Mobility: Supine to Sit, Sit to Supine ?   ?  ?Supine to sit: Min assist, HOB elevated ?Sit to supine: Supervision, HOB elevated ?  ?General bed mobility comments: To elevate trunk upright. returned to supine with no assistance ?  ? ?Transfers ?Overall transfer level: Needs assistance ?Equipment used: Rolling walker (2 wheels) ?  ?  ?  ?  ?  ?  ?  ?General transfer comment: attemted to stand at RW x 2 with feet sliding( no socks) , then patient  more agitated so encouraged patient to return to supine. Patient  did so with assistance. ?  ? ?Ambulation/Gait ?  ?  ?  ?  ?  ?  ?  ?  ? ? ?Stairs ?  ?  ?  ?  ?  ? ? ?Wheelchair Mobility ?  ? ?Modified Rankin (Stroke Patients Only) ?  ? ? ?  ?Balance   ?  ?  ?  ?  ?  ?  ?  ?  ?  ?  ?  ?  ?  ?  ?  ?  ?  ?  ?  ? ?  ?Cognition Arousal/Alertness: Awake/alert ?Behavior During Therapy: Agitated ?  ?  ?  ?  ?  ?  ?  ?  ?  ?  ?  ?  ?  ?  ?  ?  ?  ?General Comments: Patient becomes agitated with questions and when he feels OT "is just standing there", ?  ?  ? ?  ?Exercises   ? ?  ?General Comments General comments (skin integrity, edema, etc.): unable to to rise from bed at [facility]. ?  ?  ? ?Pertinent Vitals/Pain    ? ? ?Home Living Family/patient expects to be discharged to:: Shelter/Homeless ?  ?  ?  ?  ?  ?  ?  ?  ?  ?  Additional Comments: Pt difficult to understand PLOF obtained from chart review as well as pt report  ?  ?Prior Function    ?  ?  ?   ? ?PT Goals (current goals can now be found in the care plan section) Progress towards PT goals: Not progressing toward goals - comment (related to patient's  participation) ? ?  ?Frequency ? ? ? Min 2X/week ? ? ? ?  ?PT Plan Current plan remains appropriate  ? ? ?Co-evaluation PT/OT/SLP Co-Evaluation/Treatment: Yes ?Reason for Co-Treatment: Necessary to address cognition/behavior during functional activity ?PT goals addressed during session: Mobility/safety with mobility ?OT goals addressed during session: ADL's and self-care ?  ? ?  ?AM-PAC PT "6 Clicks" Mobility   ?Outcome  Measure ? Help needed turning from your back to your side while in a flat bed without using bedrails?: A Little ?Help needed moving from lying on your back to sitting on the side of a flat bed without using bedrails?: A Little ?Help needed moving to and from a bed to a chair (including a wheelchair)?: Total ?Help needed standing up from a chair using your arms (e.g., wheelchair or bedside chair)?: Total ?Help needed to walk in hospital room?: Total ?Help needed climbing 3-5 steps with a railing? : Total ?6 Click Score: 10 ? ?  ?End of Session   ?Activity Tolerance: Treatment limited secondary to agitation ?Patient left: in bed;with call bell/phone within reach;with bed alarm set ?Nurse Communication: Mobility status ?PT Visit Diagnosis: Other abnormalities of gait and mobility (R26.89);Difficulty in walking, not elsewhere classified (R26.2);Muscle weakness (generalized) (M62.81) ?  ? ? ?Time: [id number] ?PT Time Calculation (min) (ACUTE ONLY): 13 min ? ?Charges:  $Therapeutic Activity: 8-22 mins          ?         Tresa Endo PT ?Acute Rehabilitation Services ?Pager [phone] ?Office [phone] ? ? ?Trinton Prewitt, Shella Maxim ?03/26/2022, 11:11 AM ? ?

## 2022-03-26 NOTE — Plan of Care (Signed)
  Problem: Education: Goal: Knowledge of General Education information will improve Description: Including pain rating scale, medication(s)/side effects and non-pharmacologic comfort measures Outcome: Progressing   Problem: Coping: Goal: Level of anxiety will decrease Outcome: Progressing   Problem: Pain Managment: Goal: General experience of comfort will improve Outcome: Progressing   

## 2022-03-27 DIAGNOSIS — I5040 Unspecified combined systolic (congestive) and diastolic (congestive) heart failure: Secondary | ICD-10-CM | POA: Diagnosis not present

## 2022-03-27 DIAGNOSIS — I5043 Acute on chronic combined systolic (congestive) and diastolic (congestive) heart failure: Secondary | ICD-10-CM | POA: Diagnosis not present

## 2022-03-27 DIAGNOSIS — Z5900 Homelessness unspecified: Secondary | ICD-10-CM | POA: Diagnosis not present

## 2022-03-27 DIAGNOSIS — M79604 Pain in right leg: Secondary | ICD-10-CM | POA: Diagnosis not present

## 2022-03-27 LAB — BASIC METABOLIC PANEL
Anion gap: 6 (ref 5–15)
BUN: 19 mg/dL (ref 6–20)
CO2: 22 mmol/L (ref 22–32)
Calcium: 8.2 mg/dL — ABNORMAL LOW (ref 8.9–10.3)
Chloride: 110 mmol/L (ref 98–111)
Creatinine, Ser: 1.03 mg/dL (ref 0.61–1.24)
GFR, Estimated: >60 mL/min (ref >60)
Glucose, Bld: 120 mg/dL — ABNORMAL HIGH (ref 70–99)
Potassium: 4.3 mmol/L (ref 3.5–5.1)
Sodium: 138 mmol/L (ref 135–145)

## 2022-03-27 NOTE — TOC Progression Note (Signed)
Transition of Care (TOC) - Progression Note  ? ? ?Patient Details  ?Name: [name] ?MRN: [id number] ?Date of Birth: January 13, 1966 ? ?Transition of Care (TOC) CM/SW Contact  ?Tawanna Cooler, RN ?Phone Number: ?03/27/2022, 11:51 AM ? ?Clinical Narrative:    ? ?At this time, no SNF has accepted patient. ?Patient will need to discharge to his prior living situation once he is medically stable for discharge.  ? ? ?Expected Discharge Plan: [place] ?Barriers to Discharge: Continued Medical Work up ? ?Expected Discharge Plan and Services ?Expected Discharge Plan: [place] ?In-house Referral: Clinical Social Work ?  ?  ?Living arrangements for the past 2 months: No permanent address, Hotel/Motel, [place], Homeless ?                ?  ?Readmission Risk Interventions ? ?  03/25/2022  ?  5:39 PM 02/04/2022  ?  4:41 PM 12/05/2021  ? 11:48 AM  ?Readmission Risk Prevention Plan  ?Transportation Screening Complete Complete Complete  ?Medication Review (RN Care Manager) Referral to Pharmacy Complete Not Complete  ?PCP or Specialist appointment within 3-5 days of discharge Complete Complete Not Complete  ?[place] or Home Care Consult Not Complete Complete Not Complete  ?[place] or Home Care Consult Pt Refusal Comments Patient is homeless    ?SW Recovery Care/Counseling Consult Complete Complete Not Complete  ?Palliative Care Screening Not Applicable Not Applicable Not Applicable  ?Skilled Nursing Facility Complete Complete Not Applicable  ? ? ?

## 2022-03-27 NOTE — Plan of Care (Signed)
Pt was compliant with dressing change last night. ?Problem: Education: ?Goal: Knowledge of General Education information will improve ?Description: Including pain rating scale, medication(s)/side effects and non-pharmacologic comfort measures ?Outcome: Progressing ?  ?Problem: Coping: ?Goal: Level of anxiety will decrease ?Outcome: Progressing ?  ?Problem: Pain Managment: ?Goal: General experience of comfort will improve ?Outcome: Progressing ?  ?

## 2022-03-27 NOTE — Progress Notes (Signed)
Pt asked for this nurse to give him his medication and something for pain. Medications given, pt apologized for his behavior. ?

## 2022-03-27 NOTE — Progress Notes (Signed)
[facility] ? ?PROGRESS NOTE ? ?[name] [id number] DOB: 1966-06-12 DOA: 03/23/2022 ?PCP: Marliss Coots, NP ? ? ?Brief HPI:   ?56 year old male with medical history of stroke with residual dysarthria/right-sided weakness, wheelchair-bound, HFrEF with a EF less than 20%, CAD, diabetes mellitus type 2, cocaine abuse, schizoaffective disorder, presented with bilateral lower extremity pain and swelling.  Patient also was seen by cardiology for CHF exacerbation. ? ? ? ?Subjective  ? ?Patient complains of pain in the lower extremities. ? ? Assessment/Plan:  ? ? ?Bilateral lower extremity pain ?-Patient has bilateral lower extremity edema with extensive lower extremity wounds worse on right  ?-Plan for lumbar negative for osteomyelitis. ?-Imaging study done in December with CT scans of lower extremities showed cellulitis versus lymphedema ?-MRI right foot obtained on 03/23/2022 showed mildly heterogeneous signal of proximal phalanx of second digit and proximal phalanx of the fifth digit, nonspecific.  Skin irregularity about dorsal/lateral aspect of the fifth digit without evidence of adjacent drainable fluid collection or abscess. ?-MRI right ankle without suspicion for osteomyelitis. ?-MRI of left foot shows bone marrow edema of the proximal phalanx of second digit with surrounding soft tissue edema.  If there is open wound around the second digit this could represent osteomyelitis, in the absence of deep skin wound this could be due to edema due to altered mechanics due to amputation of the first digit.   ?-Called and discussed with Dr. Sharol Given, he reviewed the MRI of both feet.  He does not feel that patient has osteomyelitis. ?-In the meantime continue gabapentin, oxycodone and Dilaudid as needed ?-Continue vancomycin and ceftriaxone ? ?Chronic combined systolic and diastolic CHF ?-BNP was elevated ?-Also had lower extremity swelling so cardiology was consulted ?-Cardiology transition IV Lasix to p.o.  and signed off ?-Continue Lasix 40 mg p.o. twice daily ?-Continue Lasix, hydralazine, Imdur, Aldactone.  No Entresto per cardiology given nonadherence ?-Echocardiogram from 3/23 showed EF less than 123456, grade 3 diastolic dysfunction  ? ?Elevated D-dimer ?-D-dimer was elevated 3.22 ?-CTA chest ordered to rule out PE ? ?COPD with acute exacerbation ?-Improved ?-Patient was treated with prednisone taper ? ?Major depressive disorder ?-Patient had suicidal ideations ?-Psychiatry was consulted ?-Psychiatry recommended to resume Abilify ?-Bedside sitter was discontinued ? ?History of stroke ?-Patient has residual dysarthria ?-Continue statin, aspirin ? ?Cocaine abuse ?-Cessation encouraged ? ?History of seizures ?-Continue Keppra ? ?Homelessness ?-TOC consulted ? ?QTc prolongation ?-QTc interval 498 ms ?-Continue monitoring on telemetry ?-Keep serum potassium more than 4, serum magnesium greater than 2 ? ? ? ? ? ? ?Medications ? ?  ? ARIPiprazole  5 mg Oral QHS  ? aspirin  81 mg Oral Daily  ? atorvastatin  40 mg Oral Daily  ? enoxaparin (LOVENOX) injection  40 mg Subcutaneous [id number]  ? folic acid  1 mg Oral Daily  ? furosemide  40 mg Oral BID  ? gabapentin  800 mg Oral TID  ? hydrALAZINE  25 mg Oral Q8H  ? ipratropium-albuterol  3 mL Nebulization TID  ? isosorbide mononitrate  60 mg Oral Daily  ? levETIRAcetam  500 mg Oral BID  ? multivitamin with minerals  1 tablet Oral Daily  ? polyethylene glycol  17 g Oral Daily  ? spironolactone  12.5 mg Oral Daily  ? thiamine  100 mg Oral Daily  ? Or  ? thiamine  100 mg Intravenous Daily  ? umeclidinium bromide  1 puff Inhalation Daily  ? ? ? Data Reviewed:  ? ?CBG: ? ?No results  for input(s): GLUCAP in the last 168 hours. ? ?SpO2: 97 % ?O2 Flow Rate (L/min): 2 L/min  ? ? ?Vitals:  ? 03/26/22 1414 03/26/22 2055 03/27/22 0456 03/27/22 0732  ?BP: (!) 139/97 (!) 167/97 (!) 154/111   ?Pulse: 96 (!) 108 (!) 106   ?Resp: 16 20 16    ?Temp: 98.3 ?F (36.8 ?C) 98.9 ?F (37.2 ?C) 97.6 ?F (36.4 ?C)    ?TempSrc: Oral  Oral   ?SpO2: 99% 98% 97% 97%  ?Weight:      ?Height:      ? ? ? ? ?Data Reviewed: ? ?Basic Metabolic Panel: ?Recent Labs  ?Lab 03/20/22 ?1306 03/23/22 ?1450 03/24/22 ?0807 03/26/22 ?0532 03/27/22 ?0500  ?NA 134* 139 137 138 138  ?K 4.4 4.1 4.6 3.5 4.3  ?CL 103 103 103 104 110  ?CO2 21* 26 24 26 22   ?GLUCOSE 93 84 103* 139* 120*  ?BUN 13 19 21* 20 19  ?CREATININE 1.15 1.14 1.17 1.03 1.03  ?CALCIUM 8.3* 9.0 8.6* 8.3* 8.2*  ?MG  --   --   --  2.1  --   ?PHOS  --   --   --  2.9  --   ? ? ?CBC: ?Recent Labs  ?Lab 03/20/22 ?1306 03/23/22 ?1450 03/24/22 ?0807 03/26/22 ?0532  ?WBC 7.7 7.8 8.9 12.7*  ?NEUTROABS 4.3 4.9  --  9.1*  ?HGB 12.4* 14.2 12.0* 12.1*  ?HCT 36.1* 44.0 36.4* 36.4*  ?MCV 92.8 95.7 94.5 96.0  ?PLT 404* 520* 433* 403*  ? ? ?LFT ?Recent Labs  ?Lab 03/20/22 ?1306 03/23/22 ?1450 03/24/22 ?0807 03/26/22 ?0532  ?AST 20 26 18  12*  ?ALT 20 21 17 13   ?ALKPHOS 92 125 103 87  ?BILITOT 1.3* 1.0 1.1 0.4  ?PROT 6.2* 7.6 6.4* 6.5  ?ALBUMIN 2.6* 3.4* 2.7* 2.7*  ? ?  ?Antibiotics: ?Anti-infectives (From admission, onward)  ? ? Start     Dose/Rate Route Frequency Ordered Stop  ? 03/25/22 0600  vancomycin (VANCOCIN) IVPB 1000 mg/200 mL premix       ? 1,000 mg ?200 mL/hr over 60 Minutes Intravenous Every 12 hours 03/24/22 1748 03/31/22 1759  ? 03/24/22 2300  vancomycin (VANCOCIN) IVPB 1000 mg/200 mL premix  Status:  Discontinued       ? 1,000 mg ?200 mL/hr over 60 Minutes Intravenous Every 12 hours 03/24/22 1039 03/24/22 1748  ? 03/24/22 1100  vancomycin (VANCOCIN) IVPB 1000 mg/200 mL premix  Status:  Discontinued       ? 1,000 mg ?200 mL/hr over 60 Minutes Intravenous  Once 03/24/22 1011 03/24/22 1039  ? 03/24/22 1100  vancomycin (VANCOREADY) IVPB 2000 mg/400 mL       ? 2,000 mg ?200 mL/hr over 120 Minutes Intravenous  Once 03/24/22 1039 03/24/22 1921  ? 03/24/22 1030  cefTRIAXone (ROCEPHIN) 2 g in sodium chloride 0.9 % 100 mL IVPB       ? 2 g ?200 mL/hr over 30 Minutes Intravenous Daily 03/24/22 1011  03/31/22 0959  ? ?  ? ? ? ?DVT prophylaxis: Lovenox ? ?Code Status: Full code ? ?Family Communication: No family at bedside ? ? ?CONSULTS cardiology ? ? ?Objective  ? ? ?Physical Examination: ? ?General-appears in no acute distress ?Heart-S1-S2, regular, no murmur auscultated ?Lungs-clear to auscultation bilaterally, no wheezing or crackles auscultated ?Abdomen-soft, nontender, no organomegaly ?Extremities-trace edema in the lower extremities, both lower extremities in dressing ?Neuro-alert, oriented x3, no focal deficit noted ? ? ?Status is: Inpatient: Bilateral lower extremity wounds, CHF exacerbation ? ? ? ?  ? ? ?  [name] ?  ?[facility] ?If 7PM-7AM, please contact night-coverage at [email or web address], ?Office  [phone] ? ? ?03/27/2022, 12:53 PM  LOS: 4 days  ? ? ? ? ? ? ? ? ? ? ?  ?

## 2022-03-27 NOTE — Progress Notes (Signed)
Pt has declined his CT for a few days now and is refusing to put back on his tele box. Dr made aware  ? ?

## 2022-03-28 ENCOUNTER — Inpatient Hospital Stay (HOSPITAL_COMMUNITY): Payer: Medicaid Other

## 2022-03-28 DIAGNOSIS — M79604 Pain in right leg: Secondary | ICD-10-CM | POA: Diagnosis not present

## 2022-03-28 DIAGNOSIS — I5040 Unspecified combined systolic (congestive) and diastolic (congestive) heart failure: Secondary | ICD-10-CM | POA: Diagnosis not present

## 2022-03-28 DIAGNOSIS — I5043 Acute on chronic combined systolic (congestive) and diastolic (congestive) heart failure: Secondary | ICD-10-CM | POA: Diagnosis not present

## 2022-03-28 DIAGNOSIS — Z5900 Homelessness unspecified: Secondary | ICD-10-CM | POA: Diagnosis not present

## 2022-03-28 IMAGING — CT CT ANGIO CHEST
2 of 7 series · 18 of 46 positions shown · IV contrast (OMNIPAQUE)
Comparison: [DATE].

CLINICAL DATA: A 55-year-old male presents with chest pain.

EXAM:
CT ANGIOGRAPHY CHEST WITH CONTRAST
TECHNIQUE: Multidetector CT imaging of the chest was performed using the
standard protocol during bolus administration of intravenous
contrast. Multiplanar CT image reconstructions and MIPs were
obtained to evaluate the vascular anatomy.

[Series 5: thins · axial · 0.72mm/px · z∈[-193,+118]mm · 16 of 353 slices shown]
[im 21/353  lung]
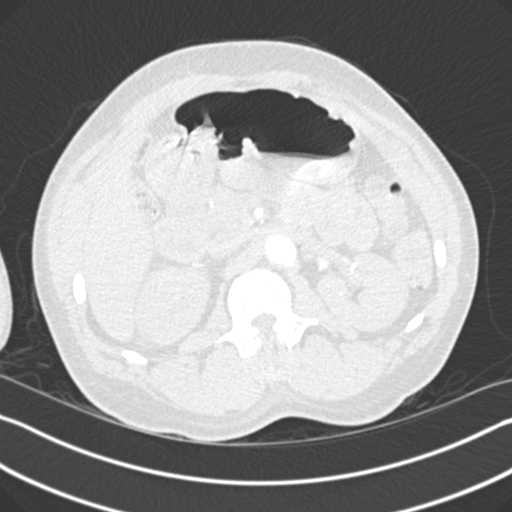
[im 42/353  soft-tissue]
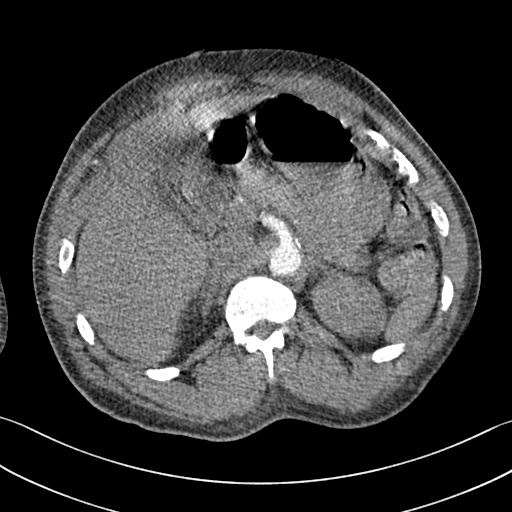
[im 63/353  lung]
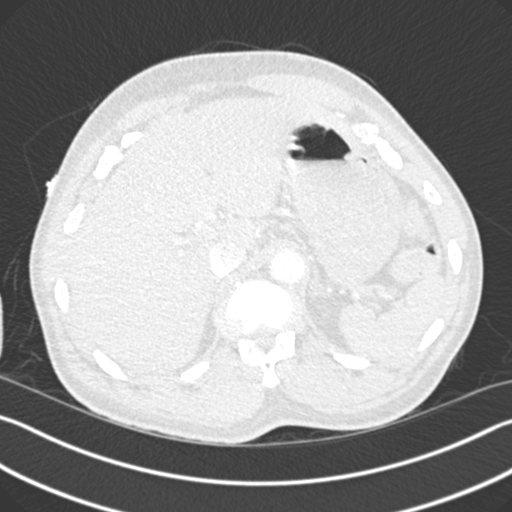
[im 83/353  soft-tissue]
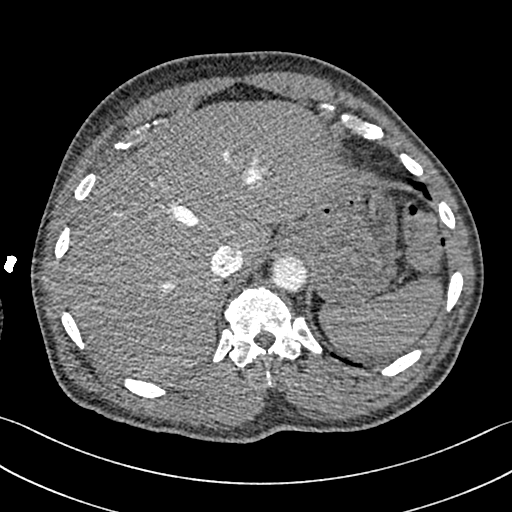
[im 104/353  lung]
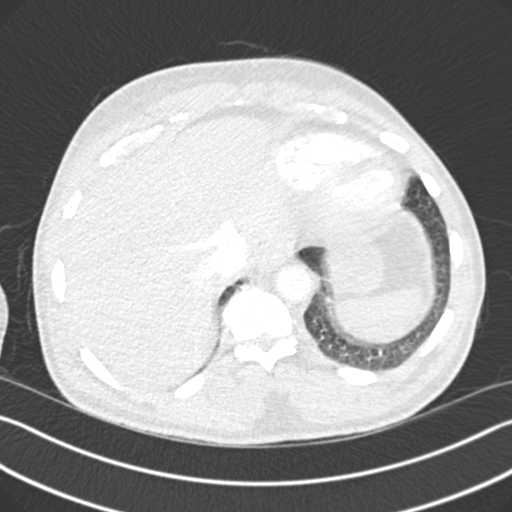
[im 125/353  soft-tissue]
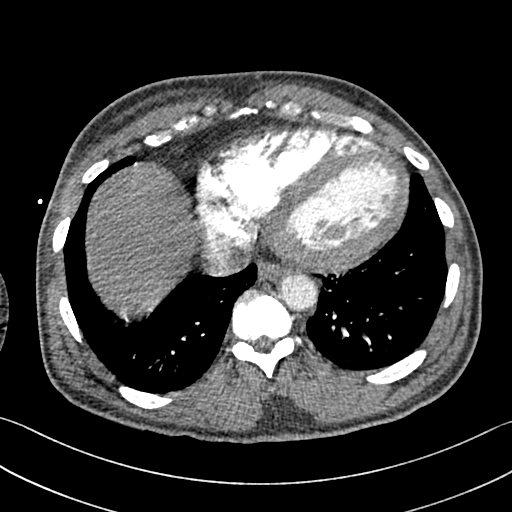
[im 145/353  lung]
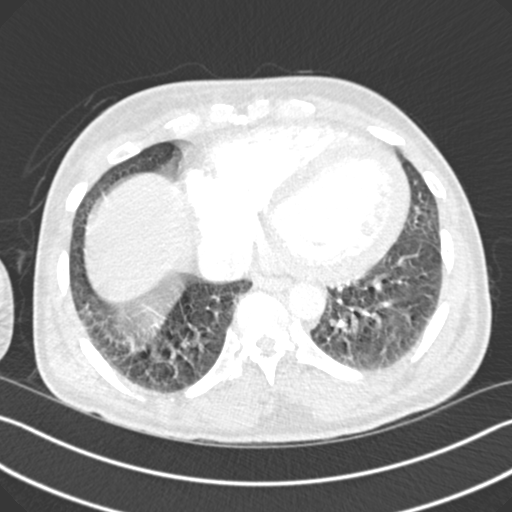
[im 166/353  soft-tissue]
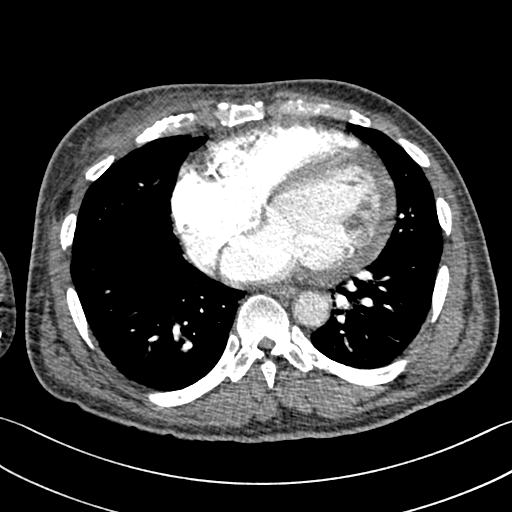
[im 187/353  lung]
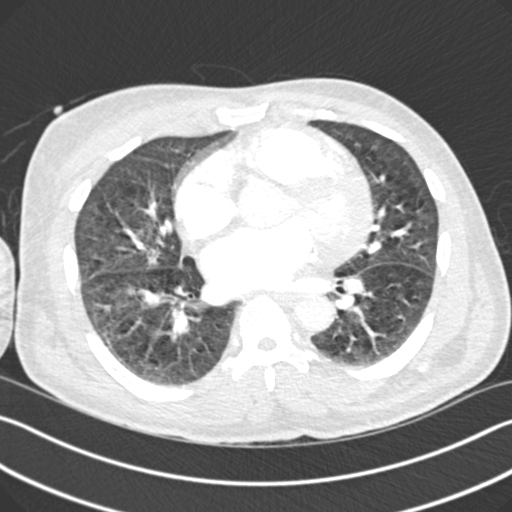
[im 208/353  soft-tissue]
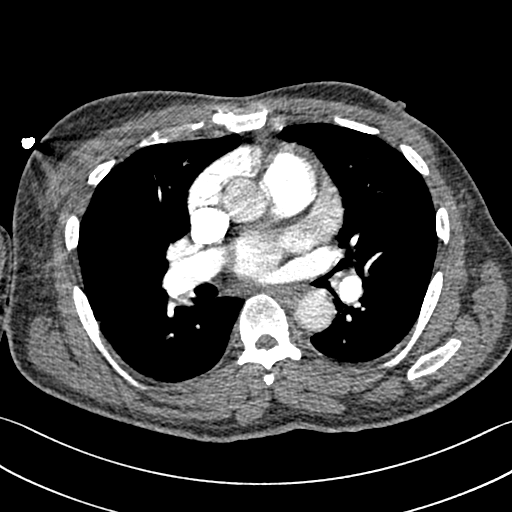
[im 228/353  lung]
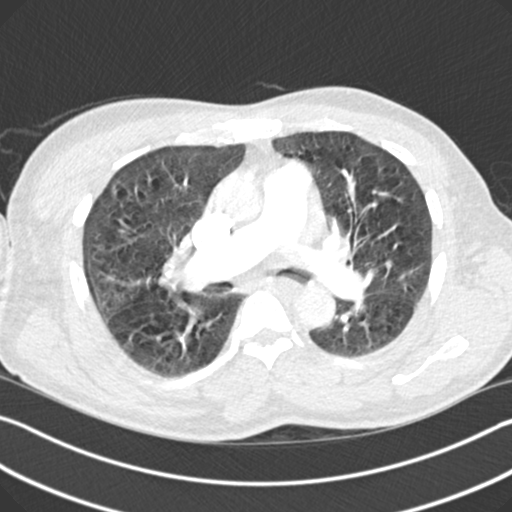
[im 249/353  soft-tissue]
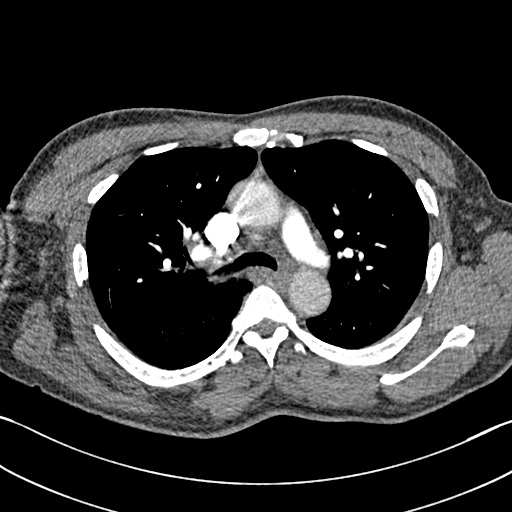
[im 270/353  lung]
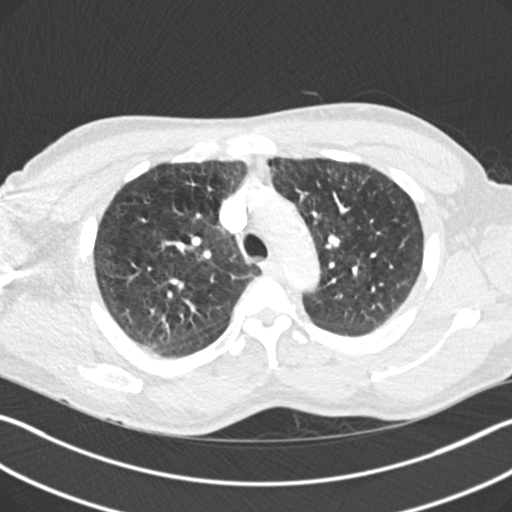
[im 290/353  soft-tissue]
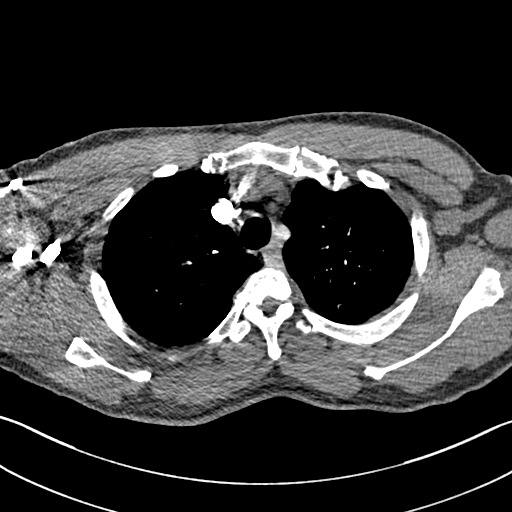
[im 311/353  lung]
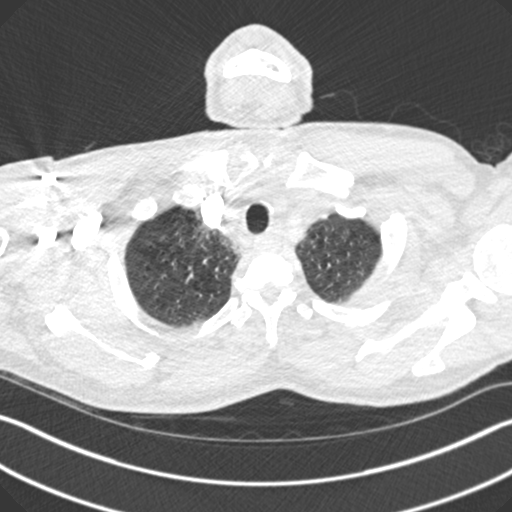
[im 332/353  soft-tissue]
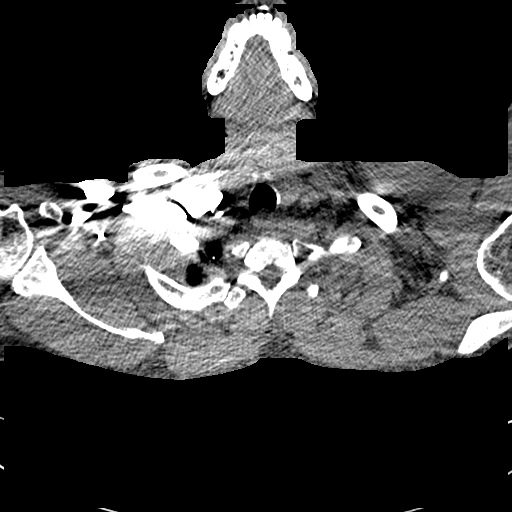

[Series 7: coronal mpr · coronal · 0.81mm/px · 2 of 85 slices shown]
[im 29/85  soft-tissue]
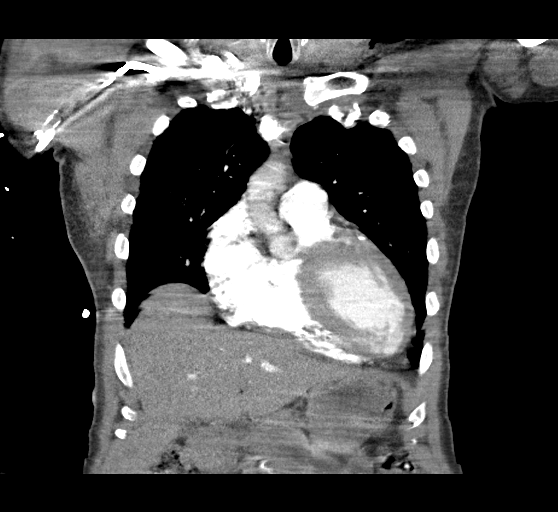
[im 57/85  soft-tissue]
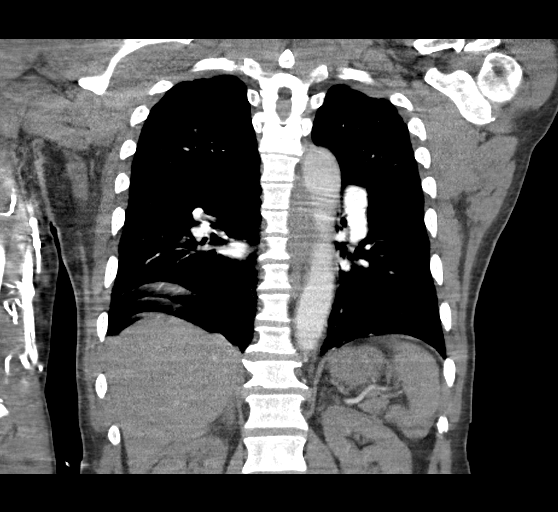

[18 of 46 positions shown; findings below may reference images not displayed]

RADIATION DOSE REDUCTION: This exam was performed according to the
departmental dose-optimization program which includes automated
exposure control, adjustment of the mA and/or kV according to
patient size and/or use of iterative reconstruction technique.

CONTRAST:  100mL OMNIPAQUE IOHEXOL 350 MG/ML SOLN
FINDINGS: Cardiovascular: Calcified and noncalcified atherosclerotic plaque of
the thoracic aorta which is nonaneurysmal. Limited assessment of the
aorta due to bolus timing for opacification of pulmonary
vasculature.

Main pulmonary artery at 299 Hounsfield units. Respiratory motion
limiting assessment of the lung bases. No signs of pulmonary
embolism to the segmental level in the upper lobes and to proximal
segmental level in the lower lobes. Heart size mildly enlarged to
moderately enlarged with three-vessel coronary artery disease. No
pericardial effusion.

Mediastinum/Nodes: No thoracic inlet, axillary, mediastinal or hilar
adenopathy. Esophagus grossly normal.

Lungs/Pleura: No consolidation or pleural effusion. Parenchymal
assessment limited at the lung bases and in the mid chest due to
respiratory motion. Signs of pulmonary emphysema. Mild slit like
narrowing of bilateral mainstem bronchi. No pneumothorax or pleural
effusion.

Upper Abdomen: Reflux of contrast into hepatic veins. Imaged
portions of liver, pancreas, spleen, adrenal glands and kidneys
without acute process to the extent evaluated. Respiratory motion
markedly limiting assessment.

Musculoskeletal: No acute bone finding. No destructive bone process.
Spinal degenerative changes.

Review of the MIP images confirms the above findings.
IMPRESSION: 1. No signs of pulmonary embolism with limitations as outlined
above, exam markedly limited the on proximal segmental level in the
lower lobes and beyond segmental level in the upper lobes.
2. Signs of pulmonary emphysema.
3. Narrowing of mainstem bronchi could be seen in the setting of
bronchomalacia.
4. Mild to moderate cardiomegaly with three-vessel coronary artery
disease.
5. Aortic atherosclerosis
6. Reflux of contrast into hepatic veins, correlate with any
clinical evidence of right heart dysfunction.

Aortic Atherosclerosis ([H4]-[H4]) and Emphysema ([H4]-[H4]).

## 2022-03-28 MED ORDER — OXYCODONE HCL 5 MG PO TABS
10.0000 mg | ORAL_TABLET | ORAL | Status: DC | PRN
Start: 2022-03-28 — End: 2022-03-31
  Administered 2022-03-28 – 2022-03-31 (×10): 10 mg via ORAL
  Filled 2022-03-28 (×11): qty 2

## 2022-03-28 MED ORDER — IOHEXOL 350 MG/ML SOLN
100.0000 mL | Freq: Once | INTRAVENOUS | Status: AC | PRN
  Administered 2022-03-28: 100 mL via INTRAVENOUS

## 2022-03-28 NOTE — Progress Notes (Signed)
Pt refused dressing change this AM. States, "I'll do it tomorrow, I'm in too much pain". Plan of care ongoing.  ?

## 2022-03-28 NOTE — Progress Notes (Signed)
Pharmacy Antibiotic Note ? ?[name] is a 56 y.o. male with chronic bilateral LE wounds presented to the ED on 03/23/2022 with c/o bilateral LE pain and swelling. Patient with chronic wounds on his lower extremities. Pharmacy has been consulted to dose vancomycin for cellulitis. ? ?Plan: ?- Continue vancomycin 1000 mg q12h for est AUC 500. Renal function remains stable, expect to discontinue or switch to PO in next 1-2 days. ?- Ceftriaxone 2gm q24h per MD ? ?________________________________________ ? ?Height: 5\' 8"  (172.7 cm) ?Weight:  (pt refused) ?IBW/kg (Calculated) : 68.4 ? ?Temp (24hrs), Avg:98.1 ?F (36.7 ?C), Min:97.8 ?F (36.6 ?C), Max:98.6 ?F (37 ?C) ? ?Recent Labs  ?Lab 03/23/22 ?1450 03/24/22 ?0807 03/26/22 ?0532 03/27/22 ?0500  ?WBC 7.8 8.9 12.7*  --   ?CREATININE 1.14 1.17 1.03 1.03  ? ?  ?Estimated Creatinine Clearance: 88 mL/min (by C-G formula based on SCr of 1.03 mg/dL).   ? ?Allergies  ?Allergen Reactions  ? Ibuprofen Anaphylaxis  ? Acetaminophen Hives, Nausea And Vomiting and Rash  ?  Pt overdosed on bottle of APAP 05/06/16 ?  ? Lyrica Cr [Pregabalin Er] Other (See Comments)  ?  Dystonia and dysarthria  ? Peanut-Containing Drug Products Other (See Comments)  ?  Pt denies allergy to peanut butter 03/02/2021  ? ? ?Thank you for allowing pharmacy to be a part of this patient?s care. ? ?05/02/2021, PharmD, BCPS ?Clinical Pharmacist ?03/28/2022 1:53 PM ? ? ?

## 2022-03-28 NOTE — Progress Notes (Signed)
Pt stated, "I do not want to go to CT now" and "I will go later on". Pt stated, "I want to sleep now". ?

## 2022-03-28 NOTE — Progress Notes (Signed)
[facility] ? ?PROGRESS NOTE ? ?[name] [id number] DOB: 1966-05-17 DOA: 03/23/2022 ?PCP: Marliss Coots, NP ? ? ?Brief HPI:   ?56 year old male with medical history of stroke with residual dysarthria/right-sided weakness, wheelchair-bound, HFrEF with a EF less than 20%, CAD, diabetes mellitus type 2, cocaine abuse, schizoaffective disorder, presented with bilateral lower extremity pain and swelling.  Patient also was seen by cardiology for CHF exacerbation. ? ? ? ?Subjective  ? ?Patient seen and examined, still complains of pain in lower extremities.  States that oxycodone 5 mg every 4 hours as needed is not enough for pain. ? ? Assessment/Plan:  ? ? ?Bilateral lower extremity pain ?-Patient has bilateral lower extremity edema with extensive lower extremity wounds worse on right  ?-Plan for lumbar negative for osteomyelitis. ?-Imaging study done in December with CT scans of lower extremities showed cellulitis versus lymphedema ?-MRI right foot obtained on 03/23/2022 showed mildly heterogeneous signal of proximal phalanx of second digit and proximal phalanx of the fifth digit, nonspecific.  Skin irregularity about dorsal/lateral aspect of the fifth digit without evidence of adjacent drainable fluid collection or abscess. ?-MRI right ankle without suspicion for osteomyelitis. ?-MRI of left foot shows bone marrow edema of the proximal phalanx of second digit with surrounding soft tissue edema.  If there is open wound around the second digit this could represent osteomyelitis, in the absence of deep skin wound this could be due to edema due to altered mechanics due to amputation of the first digit.   ?-Called and discussed with Dr. Sharol Given, he reviewed the MRI of both feet.  He does not feel that patient has osteomyelitis. ?-In the meantime continue gabapentin, will change oxycodone to 10 mg every 4 hours as needed.   ?-Will discontinue IV Dilaudid. ?-Continue vancomycin and ceftriaxone ? ?Chronic combined  systolic and diastolic CHF ?-BNP was elevated ?-Also had lower extremity swelling so cardiology was consulted ?-Cardiology transition IV Lasix to p.o. and signed off ?-Continue Lasix 40 mg p.o. twice daily ?-Continue Lasix, hydralazine, Imdur, Aldactone.  No Entresto per cardiology given nonadherence ?-Echocardiogram from 3/23 showed EF less than 123456, grade 3 diastolic dysfunction  ? ?Elevated D-dimer ?-D-dimer was elevated 3.22 ?-CTA chest ordered to rule out PE ?-Patient has been refusing CTA chest since 03/23/2022. ? ?COPD with acute exacerbation ?-Improved ?-Patient was treated with prednisone taper ? ?Major depressive disorder ?-Patient had suicidal ideations ?-Psychiatry was consulted ?-Psychiatry recommended to resume Abilify ?-Bedside sitter was discontinued ? ?History of stroke ?-Patient has residual dysarthria ?-Continue statin, aspirin ? ?Cocaine abuse ?-Cessation encouraged ? ?History of seizures ?-Continue Keppra ? ?Homelessness ?-TOC consulted ? ?QTc prolongation ?-QTc interval 498 ms ?-Continue monitoring on telemetry ?-Keep serum potassium more than 4, serum magnesium greater than 2 ? ? ? ? ? ? ?Medications ? ?  ? ARIPiprazole  5 mg Oral QHS  ? aspirin  81 mg Oral Daily  ? atorvastatin  40 mg Oral Daily  ? enoxaparin (LOVENOX) injection  40 mg Subcutaneous [id number]  ? folic acid  1 mg Oral Daily  ? furosemide  40 mg Oral BID  ? gabapentin  800 mg Oral TID  ? hydrALAZINE  25 mg Oral Q8H  ? ipratropium-albuterol  3 mL Nebulization TID  ? isosorbide mononitrate  60 mg Oral Daily  ? levETIRAcetam  500 mg Oral BID  ? multivitamin with minerals  1 tablet Oral Daily  ? polyethylene glycol  17 g Oral Daily  ? spironolactone  12.5 mg Oral Daily  ?  thiamine  100 mg Oral Daily  ? Or  ? thiamine  100 mg Intravenous Daily  ? umeclidinium bromide  1 puff Inhalation Daily  ? ? ? Data Reviewed:  ? ?CBG: ? ?No results for input(s): GLUCAP in the last 168 hours. ? ?SpO2: 98 % ?O2 Flow Rate (L/min): 2 L/min  ? ? ?Vitals:  ?  03/28/22 0549 03/28/22 [id number] 03/28/22 [id number] 03/28/22 0749  ?BP: (!) 152/120 (!) 147/129    ?Pulse: (!) 107 (!) 113 (!) 110   ?Resp:  20    ?Temp:  97.8 ?F (36.6 ?C)    ?TempSrc:  Oral    ?SpO2:  99%  98%  ?Weight:      ?Height:      ? ? ? ? ?Data Reviewed: ? ?Basic Metabolic Panel: ?Recent Labs  ?Lab 03/23/22 ?1450 03/24/22 ?0807 03/26/22 ?0532 03/27/22 ?0500  ?NA 139 137 138 138  ?K 4.1 4.6 3.5 4.3  ?CL 103 103 104 110  ?CO2 26 24 26 22   ?GLUCOSE 84 103* 139* 120*  ?BUN 19 21* 20 19  ?CREATININE 1.14 1.17 1.03 1.03  ?CALCIUM 9.0 8.6* 8.3* 8.2*  ?MG  --   --  2.1  --   ?PHOS  --   --  2.9  --   ? ? ?CBC: ?Recent Labs  ?Lab 03/23/22 ?1450 03/24/22 ?0807 03/26/22 ?0532  ?WBC 7.8 8.9 12.7*  ?NEUTROABS 4.9  --  9.1*  ?HGB 14.2 12.0* 12.1*  ?HCT 44.0 36.4* 36.4*  ?MCV 95.7 94.5 96.0  ?PLT 520* 433* 403*  ? ? ?LFT ?Recent Labs  ?Lab 03/23/22 ?1450 03/24/22 ?0807 03/26/22 ?0532  ?AST 26 18 12*  ?ALT 21 17 13   ?ALKPHOS 125 103 87  ?BILITOT 1.0 1.1 0.4  ?PROT 7.6 6.4* 6.5  ?ALBUMIN 3.4* 2.7* 2.7*  ? ?  ?Antibiotics: ?Anti-infectives (From admission, onward)  ? ? Start     Dose/Rate Route Frequency Ordered Stop  ? 03/25/22 0600  vancomycin (VANCOCIN) IVPB 1000 mg/200 mL premix       ? 1,000 mg ?200 mL/hr over 60 Minutes Intravenous Every 12 hours 03/24/22 1748 03/31/22 1759  ? 03/24/22 2300  vancomycin (VANCOCIN) IVPB 1000 mg/200 mL premix  Status:  Discontinued       ? 1,000 mg ?200 mL/hr over 60 Minutes Intravenous Every 12 hours 03/24/22 1039 03/24/22 1748  ? 03/24/22 1100  vancomycin (VANCOCIN) IVPB 1000 mg/200 mL premix  Status:  Discontinued       ? 1,000 mg ?200 mL/hr over 60 Minutes Intravenous  Once 03/24/22 1011 03/24/22 1039  ? 03/24/22 1100  vancomycin (VANCOREADY) IVPB 2000 mg/400 mL       ? 2,000 mg ?200 mL/hr over 120 Minutes Intravenous  Once 03/24/22 1039 03/24/22 1921  ? 03/24/22 1030  cefTRIAXone (ROCEPHIN) 2 g in sodium chloride 0.9 % 100 mL IVPB       ? 2 g ?200 mL/hr over 30 Minutes Intravenous Daily  03/24/22 1011 03/31/22 0959  ? ?  ? ? ? ?DVT prophylaxis: Lovenox ? ?Code Status: Full code ? ?Family Communication: No family at bedside ? ? ?CONSULTS cardiology ? ? ?Objective  ? ? ?Physical Examination: ? ?General-appears in no acute distress ?Heart-S1-S2, regular, no murmur auscultated ?Lungs-clear to auscultation bilaterally, no wheezing or crackles auscultated ?Abdomen-soft, nontender, no organomegaly ?Extremities-bilateral lower extremities in dressing, 1+ edema in the lower extremities  ?Neuro-alert, oriented x3, no focal deficit noted ? ? ?Status is: Inpatient: Bilateral lower extremity wounds, CHF  exacerbation ? ? ? ?  ? ? ?[name] ?  ?[facility] ?If 7PM-7AM, please contact night-coverage at [email or web address], ?Office  [phone] ? ? ?03/28/2022, 9:16 AM  LOS: 5 days  ? ? ? ? ? ? ? ? ? ? ?  ?

## 2022-03-29 ENCOUNTER — Encounter (HOSPITAL_COMMUNITY): Payer: Medicaid Other

## 2022-03-29 DIAGNOSIS — I5043 Acute on chronic combined systolic (congestive) and diastolic (congestive) heart failure: Secondary | ICD-10-CM | POA: Diagnosis not present

## 2022-03-29 DIAGNOSIS — M79604 Pain in right leg: Secondary | ICD-10-CM | POA: Diagnosis not present

## 2022-03-29 DIAGNOSIS — I5040 Unspecified combined systolic (congestive) and diastolic (congestive) heart failure: Secondary | ICD-10-CM | POA: Diagnosis not present

## 2022-03-29 DIAGNOSIS — Z5900 Homelessness unspecified: Secondary | ICD-10-CM | POA: Diagnosis not present

## 2022-03-29 NOTE — Progress Notes (Signed)
Pt refused dressing change.

## 2022-03-29 NOTE — Progress Notes (Signed)
IV site bleeding, painful. IV team attempted new site, and patient became verbally aggressive to staff. MD notified. Patient also refusing wound care at this time ?

## 2022-03-29 NOTE — Progress Notes (Signed)
Pt educated on ultrasound scan. Pt stated, " I'm not doing a ultrasound". MD notified.  ?

## 2022-03-29 NOTE — Plan of Care (Signed)
  Problem: Pain Managment: Goal: General experience of comfort will improve Outcome: Progressing   

## 2022-03-29 NOTE — Progress Notes (Signed)
[facility] ? ?PROGRESS NOTE ? ?[name] [id number] DOB: October 24, 1966 DOA: 03/23/2022 ?PCP: Marliss Coots, NP ? ? ?Brief HPI:   ?56 year old male with medical history of stroke with residual dysarthria/right-sided weakness, wheelchair-bound, HFrEF with a EF less than 20%, CAD, diabetes mellitus type 2, cocaine abuse, schizoaffective disorder, presented with bilateral lower extremity pain and swelling.  Patient also was seen by cardiology for CHF exacerbation. ? ? ? ?Subjective  ? ?Patient seen and examined, no new complaints.  Today is day #6 of 7 of IV ceftriaxone. ? ? Assessment/Plan:  ? ? ?Bilateral lower extremity pain ?-Patient has bilateral lower extremity edema with extensive lower extremity wounds worse on right  ?-Plan for lumbar negative for osteomyelitis. ?-Imaging study done in December with CT scans of lower extremities showed cellulitis versus lymphedema ?-MRI right foot obtained on 03/23/2022 showed mildly heterogeneous signal of proximal phalanx of second digit and proximal phalanx of the fifth digit, nonspecific.  Skin irregularity about dorsal/lateral aspect of the fifth digit without evidence of adjacent drainable fluid collection or abscess. ?-MRI right ankle without suspicion for osteomyelitis. ?-MRI of left foot shows bone marrow edema of the proximal phalanx of second digit with surrounding soft tissue edema.  If there is open wound around the second digit this could represent osteomyelitis, in the absence of deep skin wound this could be due to edema due to altered mechanics due to amputation of the first digit.   ?-Called and discussed with Dr. Sharol Given, he reviewed the MRI of both feet.  He does not feel that patient has osteomyelitis. ?-In the meantime continue gabapentin, will change oxycodone to 10 mg every 4 hours as needed.   ?-Will discontinue IV Dilaudid. ?-Continue ceftriaxone for total 7 days.  Last day 03/30/2022 ? ?Chronic combined systolic and diastolic CHF ?-BNP was  elevated ?-Also had lower extremity swelling so cardiology was consulted ?-Cardiology transition IV Lasix to p.o. and signed off ?-Continue Lasix 40 mg p.o. twice daily ?-Continue Lasix, hydralazine, Imdur, Aldactone.  No Entresto per cardiology given nonadherence ?-Echocardiogram from 3/23 showed EF less than 123456, grade 3 diastolic dysfunction  ? ?Elevated D-dimer ?-D-dimer was elevated 3.22 ?-CTA chest ordered to rule out PE ?-CTA chest is negative for PE. ?-We will obtain bilateral venous duplex of lower extremities to rule out DVT ? ?COPD with acute exacerbation ?-Improved ?-Patient was treated with prednisone taper ? ?Major depressive disorder ?-Patient had suicidal ideations ?-Psychiatry was consulted ?-Psychiatry recommended to resume Abilify ?-Bedside sitter was discontinued ? ?History of stroke ?-Patient has residual dysarthria ?-Continue statin, aspirin ? ?Cocaine abuse ?-Cessation encouraged ? ?History of seizures ?-Continue Keppra ? ?Homelessness ?-TOC consulted ? ?QTc prolongation ?-QTc interval 498 ms ?-Continue monitoring on telemetry ?-Keep serum potassium more than 4, serum magnesium greater than 2 ? ? ? ? ? ? ?Medications ? ?  ? ARIPiprazole  5 mg Oral QHS  ? aspirin  81 mg Oral Daily  ? atorvastatin  40 mg Oral Daily  ? enoxaparin (LOVENOX) injection  40 mg Subcutaneous [id number]  ? folic acid  1 mg Oral Daily  ? furosemide  40 mg Oral BID  ? gabapentin  800 mg Oral TID  ? hydrALAZINE  25 mg Oral Q8H  ? ipratropium-albuterol  3 mL Nebulization TID  ? isosorbide mononitrate  60 mg Oral Daily  ? levETIRAcetam  500 mg Oral BID  ? multivitamin with minerals  1 tablet Oral Daily  ? polyethylene glycol  17 g Oral Daily  ?  spironolactone  12.5 mg Oral Daily  ? thiamine  100 mg Oral Daily  ? Or  ? thiamine  100 mg Intravenous Daily  ? umeclidinium bromide  1 puff Inhalation Daily  ? ? ? Data Reviewed:  ? ?CBG: ? ?No results for input(s): GLUCAP in the last 168 hours. ? ?SpO2: 98 % ?O2 Flow Rate (L/min): 2  L/min  ? ? ?Vitals:  ? 03/28/22 1416 03/28/22 1420 03/29/22 0500 03/29/22 0556  ?BP:  (!) 126/112  (!) 124/95  ?Pulse:  100  (!) 106  ?Resp:  20  18  ?Temp:  97.9 ?F (36.6 ?C)  98.1 ?F (36.7 ?C)  ?TempSrc:  Oral  Oral  ?SpO2: 99% 100%  98%  ?Weight:   86.6 kg   ?Height:      ? ? ? ? ?Data Reviewed: ? ?Basic Metabolic Panel: ?Recent Labs  ?Lab 03/23/22 ?1450 03/24/22 ?0807 03/26/22 ?0532 03/27/22 ?0500  ?NA 139 137 138 138  ?K 4.1 4.6 3.5 4.3  ?CL 103 103 104 110  ?CO2 26 24 26 22   ?GLUCOSE 84 103* 139* 120*  ?BUN 19 21* 20 19  ?CREATININE 1.14 1.17 1.03 1.03  ?CALCIUM 9.0 8.6* 8.3* 8.2*  ?MG  --   --  2.1  --   ?PHOS  --   --  2.9  --   ? ? ?CBC: ?Recent Labs  ?Lab 03/23/22 ?1450 03/24/22 ?0807 03/26/22 ?0532  ?WBC 7.8 8.9 12.7*  ?NEUTROABS 4.9  --  9.1*  ?HGB 14.2 12.0* 12.1*  ?HCT 44.0 36.4* 36.4*  ?MCV 95.7 94.5 96.0  ?PLT 520* 433* 403*  ? ? ?LFT ?Recent Labs  ?Lab 03/23/22 ?1450 03/24/22 ?0807 03/26/22 ?0532  ?AST 26 18 12*  ?ALT 21 17 13   ?ALKPHOS 125 103 87  ?BILITOT 1.0 1.1 0.4  ?PROT 7.6 6.4* 6.5  ?ALBUMIN 3.4* 2.7* 2.7*  ? ?  ?Antibiotics: ?Anti-infectives (From admission, onward)  ? ? Start     Dose/Rate Route Frequency Ordered Stop  ? 03/25/22 0600  vancomycin (VANCOCIN) IVPB 1000 mg/200 mL premix       ? 1,000 mg ?200 mL/hr over 60 Minutes Intravenous Every 12 hours 03/24/22 1748 03/31/22 1759  ? 03/24/22 2300  vancomycin (VANCOCIN) IVPB 1000 mg/200 mL premix  Status:  Discontinued       ? 1,000 mg ?200 mL/hr over 60 Minutes Intravenous Every 12 hours 03/24/22 1039 03/24/22 1748  ? 03/24/22 1100  vancomycin (VANCOCIN) IVPB 1000 mg/200 mL premix  Status:  Discontinued       ? 1,000 mg ?200 mL/hr over 60 Minutes Intravenous  Once 03/24/22 1011 03/24/22 1039  ? 03/24/22 1100  vancomycin (VANCOREADY) IVPB 2000 mg/400 mL       ? 2,000 mg ?200 mL/hr over 120 Minutes Intravenous  Once 03/24/22 1039 03/24/22 1921  ? 03/24/22 1030  cefTRIAXone (ROCEPHIN) 2 g in sodium chloride 0.9 % 100 mL IVPB       ? 2 g ?200  mL/hr over 30 Minutes Intravenous Daily 03/24/22 1011 03/31/22 0959  ? ?  ? ? ? ?DVT prophylaxis: Lovenox ? ?Code Status: Full code ? ?Family Communication: No family at bedside ? ? ?CONSULTS cardiology ? ? ?Objective  ? ? ?Physical Examination: ? ?General-appears in no acute distress ?Heart-S1-S2, regular, no murmur auscultated ?Lungs-clear to auscultation bilaterally, no wheezing or crackles auscultated ?Abdomen-soft, nontender, no organomegaly ?Extremities-trace edema in the lower extremities, bilateral lower extremities in dressing ?Neuro-alert, oriented x3, no focal deficit noted ? ? ?  Status is: Inpatient: Bilateral lower extremity wounds, CHF exacerbation ? ? ? ?  ? ? ?[name] ?  ?[facility] ?If 7PM-7AM, please contact night-coverage at [email or web address], ?Office  [phone] ? ? ?03/29/2022, 11:05 AM  LOS: 6 days  ? ? ? ? ? ? ? ? ? ? ?  ?

## 2022-03-30 ENCOUNTER — Inpatient Hospital Stay (HOSPITAL_COMMUNITY): Payer: Medicaid Other

## 2022-03-30 DIAGNOSIS — R609 Edema, unspecified: Secondary | ICD-10-CM

## 2022-03-30 DIAGNOSIS — I5043 Acute on chronic combined systolic (congestive) and diastolic (congestive) heart failure: Secondary | ICD-10-CM | POA: Diagnosis not present

## 2022-03-30 DIAGNOSIS — Z5900 Homelessness unspecified: Secondary | ICD-10-CM | POA: Diagnosis not present

## 2022-03-30 DIAGNOSIS — M79604 Pain in right leg: Secondary | ICD-10-CM | POA: Diagnosis not present

## 2022-03-30 DIAGNOSIS — M7989 Other specified soft tissue disorders: Secondary | ICD-10-CM

## 2022-03-30 DIAGNOSIS — I5040 Unspecified combined systolic (congestive) and diastolic (congestive) heart failure: Secondary | ICD-10-CM | POA: Diagnosis not present

## 2022-03-30 MED ORDER — APIXABAN 5 MG PO TABS
5.0000 mg | ORAL_TABLET | Freq: Two times a day (BID) | ORAL | Status: DC
Start: 2022-04-06 — End: 2022-03-31

## 2022-03-30 MED ORDER — APIXABAN 5 MG PO TABS
10.0000 mg | ORAL_TABLET | Freq: Two times a day (BID) | ORAL | Status: DC
  Administered 2022-03-30: 10 mg via ORAL
  Filled 2022-03-30 (×3): qty 2

## 2022-03-30 NOTE — Progress Notes (Signed)
[facility] ? ?PROGRESS NOTE ? ?Jahkari Manis [id number] DOB: 1965/12/17 DOA: 03/23/2022 ?PCP: Marliss Coots, NP ? ? ?Brief HPI:   ?56 year old male with medical history of stroke with residual dysarthria/right-sided weakness, wheelchair-bound, HFrEF with a EF less than 20%, CAD, diabetes mellitus type 2, cocaine abuse, schizoaffective disorder, presented with bilateral lower extremity pain and swelling.  Patient also was seen by cardiology for CHF exacerbation. ? ? ? ?Subjective  ? ?Patient seen and examined, complained of right upper extremity swelling.  He has a IV in place.  Venous duplex of upper extremity showed age-indeterminate deep vein thrombosis involving brachial vein and superficial thrombus involving cephalic vein. ? ? Assessment/Plan:  ? ? ?Bilateral lower extremity pain ?-Patient has bilateral lower extremity edema with extensive lower extremity wounds worse on right  ?-Plan for lumbar negative for osteomyelitis. ?-Imaging study done in December with CT scans of lower extremities showed cellulitis versus lymphedema ?-MRI right foot obtained on 03/23/2022 showed mildly heterogeneous signal of proximal phalanx of second digit and proximal phalanx of the fifth digit, nonspecific.  Skin irregularity about dorsal/lateral aspect of the fifth digit without evidence of adjacent drainable fluid collection or abscess. ?-MRI right ankle without suspicion for osteomyelitis. ?-MRI of left foot shows bone marrow edema of the proximal phalanx of second digit with surrounding soft tissue edema.  If there is open wound around the second digit this could represent osteomyelitis, in the absence of deep skin wound this could be due to edema due to altered mechanics due to amputation of the first digit.   ?-Called and discussed with Dr. Sharol Given, he reviewed the MRI of both feet.  He does not feel that patient has osteomyelitis. ?-In the meantime continue gabapentin, will change oxycodone to 10 mg every 4 hours  as needed.   ?-Will discontinue IV Dilaudid. ?-Continue ceftriaxone for total 7 days.  Last day 03/30/2022 ? ?DVT brachial vein ?-Right upper extremity venous duplex shows brachial vein DVT ?-Called and discussed with vascular surgeon Dr. Unk Lightning, he recommends anticoagulation therapy for 3 months ?-We will start Eliquis per pharmacy ? ? ?Chronic combined systolic and diastolic CHF ?-BNP was elevated ?-Also had lower extremity swelling so cardiology was consulted ?-Cardiology transition IV Lasix to p.o. and signed off ?-Continue Lasix 40 mg p.o. twice daily ?-Continue Lasix, hydralazine, Imdur, Aldactone.  No Entresto per cardiology given nonadherence ?-Echocardiogram from 3/23 showed EF less than 123456, grade 3 diastolic dysfunction  ? ?Elevated D-dimer ?-D-dimer was elevated 3.22 ?-CTA chest ordered to rule out PE ?-CTA chest is negative for PE. ?-Venous duplex of lower extremities was negative for DVT ?-Found to have DVT in right upper extremity as above ? ?COPD with acute exacerbation ?-Improved ?-Patient was treated with prednisone taper ? ?Major depressive disorder ?-Patient had suicidal ideations ?-Psychiatry was consulted ?-Psychiatry recommended to resume Abilify ?-Bedside sitter was discontinued ? ?History of stroke ?-Patient has residual dysarthria ?-Continue statin, aspirin ? ?Cocaine abuse ?-Cessation encouraged ? ?History of seizures ?-Continue Keppra ? ?Homelessness ?-TOC consulted ? ?QTc prolongation ?-QTc interval 498 ms ?-Continue monitoring on telemetry ?-Keep serum potassium more than 4, serum magnesium greater than 2 ? ? ? ? ? ? ?Medications ? ?  ? ARIPiprazole  5 mg Oral QHS  ? aspirin  81 mg Oral Daily  ? atorvastatin  40 mg Oral Daily  ? enoxaparin (LOVENOX) injection  40 mg Subcutaneous [id number]  ? folic acid  1 mg Oral Daily  ? furosemide  40 mg Oral BID  ?  gabapentin  800 mg Oral TID  ? hydrALAZINE  25 mg Oral Q8H  ? ipratropium-albuterol  3 mL Nebulization TID  ? isosorbide mononitrate  60 mg  Oral Daily  ? levETIRAcetam  500 mg Oral BID  ? multivitamin with minerals  1 tablet Oral Daily  ? polyethylene glycol  17 g Oral Daily  ? spironolactone  12.5 mg Oral Daily  ? thiamine  100 mg Oral Daily  ? Or  ? thiamine  100 mg Intravenous Daily  ? umeclidinium bromide  1 puff Inhalation Daily  ? ? ? Data Reviewed:  ? ?CBG: ? ?No results for input(s): GLUCAP in the last 168 hours. ? ?SpO2: 96 % ?O2 Flow Rate (L/min): 2 L/min  ? ? ?Vitals:  ? 03/29/22 1443 03/29/22 1900 03/29/22 1937 03/30/22 0500  ?BP: (!) 150/104  133/88   ?Pulse: (!) 107  94   ?Resp: 20  18   ?Temp: 98.9 ?F (37.2 ?C)  98.3 ?F (36.8 ?C)   ?TempSrc: Oral  Oral   ?SpO2: 100% 98% 96%   ?Weight:    83.5 kg  ?Height:      ? ? ? ? ?Data Reviewed: ? ?Basic Metabolic Panel: ?Recent Labs  ?Lab 03/23/22 ?1450 03/24/22 ?0807 03/26/22 ?0532 03/27/22 ?0500  ?NA 139 137 138 138  ?K 4.1 4.6 3.5 4.3  ?CL 103 103 104 110  ?CO2 26 24 26 22   ?GLUCOSE 84 103* 139* 120*  ?BUN 19 21* 20 19  ?CREATININE 1.14 1.17 1.03 1.03  ?CALCIUM 9.0 8.6* 8.3* 8.2*  ?MG  --   --  2.1  --   ?PHOS  --   --  2.9  --   ? ? ?CBC: ?Recent Labs  ?Lab 03/23/22 ?1450 03/24/22 ?0807 03/26/22 ?0532  ?WBC 7.8 8.9 12.7*  ?NEUTROABS 4.9  --  9.1*  ?HGB 14.2 12.0* 12.1*  ?HCT 44.0 36.4* 36.4*  ?MCV 95.7 94.5 96.0  ?PLT 520* 433* 403*  ? ? ?LFT ?Recent Labs  ?Lab 03/23/22 ?1450 03/24/22 ?0807 03/26/22 ?0532  ?AST 26 18 12*  ?ALT 21 17 13   ?ALKPHOS 125 103 87  ?BILITOT 1.0 1.1 0.4  ?PROT 7.6 6.4* 6.5  ?ALBUMIN 3.4* 2.7* 2.7*  ? ?  ?Antibiotics: ?Anti-infectives (From admission, onward)  ? ? Start     Dose/Rate Route Frequency Ordered Stop  ? 03/25/22 0600  vancomycin (VANCOCIN) IVPB 1000 mg/200 mL premix       ? 1,000 mg ?200 mL/hr over 60 Minutes Intravenous Every 12 hours 03/24/22 1748 03/31/22 1759  ? 03/24/22 2300  vancomycin (VANCOCIN) IVPB 1000 mg/200 mL premix  Status:  Discontinued       ? 1,000 mg ?200 mL/hr over 60 Minutes Intravenous Every 12 hours 03/24/22 1039 03/24/22 1748  ? 03/24/22  1100  vancomycin (VANCOCIN) IVPB 1000 mg/200 mL premix  Status:  Discontinued       ? 1,000 mg ?200 mL/hr over 60 Minutes Intravenous  Once 03/24/22 1011 03/24/22 1039  ? 03/24/22 1100  vancomycin (VANCOREADY) IVPB 2000 mg/400 mL       ? 2,000 mg ?200 mL/hr over 120 Minutes Intravenous  Once 03/24/22 1039 03/24/22 1921  ? 03/24/22 1030  cefTRIAXone (ROCEPHIN) 2 g in sodium chloride 0.9 % 100 mL IVPB       ? 2 g ?200 mL/hr over 30 Minutes Intravenous Daily 03/24/22 1011 03/31/22 0959  ? ?  ? ? ? ?DVT prophylaxis: Lovenox ? ?Code Status: Full code ? ?Family Communication:  No family at bedside ? ? ?CONSULTS cardiology ? ? ?Objective  ? ? ?Physical Examination: ? ?General-appears in no acute distress ?Heart-S1-S2, regular, no murmur auscultated ?Lungs-clear to auscultation bilaterally, no wheezing or crackles auscultated ?Abdomen-soft, nontender, no organomegaly ?Extremities-right upper extremity is edematous ?Neuro-alert, oriented x3, no focal deficit noted ? ?Status is: Inpatient: Bilateral lower extremity wounds, CHF exacerbation ? ? ? ?  ? ? ?[name] ?  ?[facility] ?If 7PM-7AM, please contact night-coverage at [email or web address], ?Office  [phone] ? ? ?03/30/2022, 10:43 AM  LOS: 7 days  ? ? ? ? ? ? ? ? ? ? ?  ?

## 2022-03-30 NOTE — Progress Notes (Signed)
Upper and lower extremity venous duplex has been completed.  ? ?Preliminary results in CV Proc.  ? ?[name] [name] ?03/30/2022 9:50 AM    ?

## 2022-03-30 NOTE — Progress Notes (Signed)
PHARMACY NOTE -  Eliquis ? ?Pharmacy has been assisting with dosing of Eliquis for (new?) UE DVT. ?VTE dosing: 10 mg PO bid x 7 days, followed by 5 mg PO bid thereafter ?Hgb slightly low but stable; Plt stable high-normal ? ?Pharmacy will sign off, following peripherally for dose adjustments and updated blood work. Please reconsult if a change in clinical status warrants re-evaluation of dosage. ? ?Bernadene Person, PharmD, BCPS ?[phone] ?03/30/2022, 10:51 AM ? ? ? ?

## 2022-03-30 NOTE — Plan of Care (Signed)
  Problem: Education: Goal: Knowledge of General Education information will improve Description Including pain rating scale, medication(s)/side effects and non-pharmacologic comfort measures Outcome: Progressing   

## 2022-03-31 ENCOUNTER — Other Ambulatory Visit (HOSPITAL_COMMUNITY): Payer: Self-pay

## 2022-03-31 DIAGNOSIS — Z5900 Homelessness unspecified: Secondary | ICD-10-CM | POA: Diagnosis not present

## 2022-03-31 DIAGNOSIS — M7989 Other specified soft tissue disorders: Secondary | ICD-10-CM | POA: Diagnosis not present

## 2022-03-31 DIAGNOSIS — I82629 Acute embolism and thrombosis of deep veins of unspecified upper extremity: Secondary | ICD-10-CM

## 2022-03-31 DIAGNOSIS — I82621 Acute embolism and thrombosis of deep veins of right upper extremity: Secondary | ICD-10-CM

## 2022-03-31 DIAGNOSIS — I5043 Acute on chronic combined systolic (congestive) and diastolic (congestive) heart failure: Secondary | ICD-10-CM | POA: Diagnosis not present

## 2022-03-31 DIAGNOSIS — M79604 Pain in right leg: Secondary | ICD-10-CM | POA: Diagnosis not present

## 2022-03-31 MED ORDER — APIXABAN 5 MG PO TABS
5.0000 mg | ORAL_TABLET | Freq: Two times a day (BID) | ORAL | 3 refills | Status: DC
  Filled 2022-03-31: qty 60, 30d supply, fill #0

## 2022-03-31 MED ORDER — OXYCODONE HCL 5 MG PO TABS
5.0000 mg | ORAL_TABLET | Freq: Four times a day (QID) | ORAL | 0 refills | Status: DC | PRN
Start: 2022-03-31 — End: 2022-04-11

## 2022-03-31 NOTE — Discharge Summary (Signed)
?Physician Discharge Summary ?  ?Patient: [name] MRN: [id number] DOB: Jan 01, 1966  ?Admit date:     03/23/2022  ?Discharge date: 03/31/22  ?Discharge Physician: [name]  ? ?PCP: Marliss Coots, NP  ? ?Recommendations at discharge:  ? ?Follow-up wound care clinic as outpatient ? ?Discharge Diagnoses: ?Principal Problem: ?  Bilateral lower extremity pain ?Active Problems: ?  Heart failure with reduced ejection fraction and diastolic dysfunction ([place]) ?  Swelling of lower extremity ?  SIRS (systemic inflammatory response syndrome) ([facility]) ?  COPD with acute exacerbation ([place]) ?  Major depressive disorder, recurrent episode ([place]) ?  History of stroke ?  Alcohol use disorder, mild, abuse ?  Cocaine abuse ([place]) ?  Seizure ([place]) ?  Homelessness ?  Elevated troponin ?  QT prolongation ?  Acute exacerbation of CHF (congestive heart failure) ([place]) ?  Dysarthria ?  Substance use disorder ?  Leg swelling ? ?Resolved Problems: ?  * No resolved hospital problems. * ? ?Hospital Course: ?56 year old male with medical history of stroke with residual dysarthria/right-sided weakness, wheelchair-bound, HFrEF with a EF less than 20%, CAD, diabetes mellitus type 2, cocaine abuse, schizoaffective disorder, presented with bilateral lower extremity pain and swelling.  Patient also was seen by cardiology for CHF exacerbation. ? ?Assessment and Plan: ? ?Bilateral lower extremity pain ?-Patient has bilateral lower extremity edema with extensive lower extremity wounds worse on right  ?-Plan for lumbar negative for osteomyelitis. ?-Imaging study done in December with CT scans of lower extremities showed cellulitis versus lymphedema ?-MRI right foot obtained on 03/23/2022 showed mildly heterogeneous signal of proximal phalanx of second digit and proximal phalanx of the fifth digit, nonspecific.  Skin irregularity about dorsal/lateral aspect of the fifth digit without evidence of adjacent drainable fluid collection or abscess. ?-MRI right  ankle without suspicion for osteomyelitis. ?-MRI of left foot shows bone marrow edema of the proximal phalanx of second digit with surrounding soft tissue edema.  If there is open wound around the second digit this could represent osteomyelitis, in the absence of deep skin wound this could be due to edema due to altered mechanics due to amputation of the first digit.   ?-Called and discussed with Dr. Sharol Given, he reviewed the MRI of both feet.  He does not feel that patient has osteomyelitis. ?-In the meantime continue gabapentin, will change oxycodone to 10 mg every 4 hours as needed.   ?-Will discontinue IV Dilaudid. ?-Patient will be discharged on oxycodone as needed ?-Follow-up Dr. Sharol Given as outpatient in 1 week ?  ?DVT brachial vein ?-Right upper extremity venous duplex shows brachial vein DVT ?-Called and discussed with vascular surgeon Dr. Unk Lightning, he recommends anticoagulation therapy for 3 months ?-We will start Eliquis  ?  ?  ?Chronic combined systolic and diastolic CHF ?-BNP was elevated ?-Also had lower extremity swelling so cardiology was consulted ?-Cardiology transition IV Lasix to p.o. and signed off ?-Continue Lasix 40 mg p.o. twice daily ?-Continue Lasix, hydralazine, Imdur, Aldactone.  No Entresto per cardiology given nonadherence ?-Echocardiogram from 3/23 showed EF less than 123456, grade 3 diastolic dysfunction  ?  ?Elevated D-dimer ?-D-dimer was elevated 3.22 ?-CTA chest ordered to rule out PE ?-CTA chest is negative for PE. ?-Venous duplex of lower extremities was negative for DVT ?-Found to have DVT in right upper extremity as above ?  ?COPD with acute exacerbation ?-Improved ?-Patient was treated with prednisone taper ?  ?Major depressive disorder ?-Patient had suicidal ideations ?-Psychiatry was consulted ?-Psychiatry recommended to resume  Abilify ?-Bedside sitter was discontinued ?  ?History of stroke ?-Patient has residual dysarthria ?-Continue statin, aspirin ?  ?Cocaine abuse ?-Cessation  encouraged ?  ?History of seizures ?-Continue Keppra ?  ?Homelessness ?-TOC consulted ?-No skilled nursing facility will take patient ?  ? ? ? ?  ? ? ?Consultants:  ?Procedures performed:  ?Disposition: Home ?Diet recommendation:  ?Discharge Diet Orders (From admission, onward)  ? ?  Start     Ordered  ? 03/31/22 0000  Diet - low sodium heart healthy       ? 03/31/22 0914  ? ?  ?  ? ?  ? ?Cardiac diet ?DISCHARGE MEDICATION: ?Allergies as of 03/31/2022   ? ?   Reactions  ? Ibuprofen Anaphylaxis  ? Acetaminophen Hives, Nausea And Vomiting, Rash  ? Pt overdosed on bottle of APAP 05/06/16  ? Lyrica Cr [pregabalin Er] Other (See Comments)  ? Dystonia and dysarthria  ? Peanut-containing Drug Products Other (See Comments)  ? Pt denies allergy to peanut butter 03/02/2021  ? ?  ? ?  ?Medication List  ?  ? ?TAKE these medications   ? ?apixaban 5 MG Tabs tablet ?Commonly known as: ELIQUIS ?Take 1 tablet (5 mg total) by mouth 2 (two) times daily. Take 2 tablets (10 mg) by mouth 2 times daily x 7 days, then start taking 1 tablet 5 mg , twice daily from 04/07/22 ?Start taking on: Apr 06, 2022 ?  ?atorvastatin 10 MG tablet ?Commonly known as: LIPITOR ?Take 4 tablets (40 mg total) by mouth daily. ?  ?carvedilol 3.125 MG tablet ?Commonly known as: COREG ?Take 1 tablet (3.125 mg total) by mouth 2 (two) times daily with a meal. ?  ?folic acid 1 MG tablet ?Commonly known as: FOLVITE ?Take 1 tablet (1 mg total) by mouth daily. ?  ?furosemide 40 MG tablet ?Commonly known as: Lasix ?Take 1 tablet (40 mg total) by mouth 2 (two) times daily. ?  ?gabapentin 400 MG capsule ?Commonly known as: NEURONTIN ?Take 1 capsule (400 mg total) by mouth 3 (three) times daily. ?  ?hydrALAZINE 25 MG tablet ?Commonly known as: APRESOLINE ?Take 1 tablet (25 mg total) by mouth 3 (three) times daily. ?  ?isosorbide mononitrate 60 MG 24 hr tablet ?Commonly known as: IMDUR ?Take 1 tablet (60 mg total) by mouth daily. ?  ?levETIRAcetam 500 MG tablet ?Commonly known as:  KEPPRA ?Take 1 tablet (500 mg total) by mouth 2 (two) times daily. ?  ?OLANZapine 5 MG tablet ?Commonly known as: ZYPREXA ?Take 1 tablet (5 mg total) by mouth daily. ?  ?oxyCODONE 5 MG immediate release tablet ?Commonly known as: Oxy IR/ROXICODONE ?Take 1-2 tablets (5-10 mg total) by mouth every 6 (six) hours as needed for severe pain or breakthrough pain. ?  ?Spiriva HandiHaler 18 MCG inhalation capsule ?Generic drug: tiotropium ?Place 1 capsule (18 mcg total) into inhaler and inhale daily as directed. ?  ?spironolactone 25 MG tablet ?Commonly known as: ALDACTONE ?Take 1/2 tablet (12.5 mg total) by mouth daily. ?  ?thiamine 100 MG tablet ?Take 1 tablet (100 mg total) by mouth daily. ?  ?valsartan 40 MG tablet ?Commonly known as: Diovan ?Take 1/2 tablet (20 mg total) by mouth 2 (two) times daily. ?  ?Ventolin HFA 108 (90 Base) MCG/ACT inhaler ?Generic drug: albuterol ?Inhale 2 puffs into the lungs every 6 (six) hours as needed for wheezing or shortness of breath. ?  ? ?  ? ?  ?  ? ? ?  ?Discharge Care Instructions  ?(  From admission, onward)  ?  ? ? ?  ? ?  Start     Ordered  ? 03/31/22 0000  Discharge wound care:       ?Comments: Follow wound care clinic  ? 03/31/22 0914  ? ?  ?  ? ?  ? ? Follow-up Information   ? ? Newt Minion, MD Follow up in 1 week(s).   ?Specialty: Orthopedic Surgery ?Contact information: ?[address] ?[place] [address] ?[phone] ? ? ?  ?  ? ?  ?  ? ?  ? ?Discharge Exam: ?Filed Weights  ? 03/29/22 0500 03/30/22 0500 03/31/22 0500  ?Weight: 86.6 kg 83.5 kg 83.1 kg  ? ?General-appears in no acute distress ?Heart-S1-S2, regular, no murmur auscultated ?Lungs-clear to auscultation bilaterally, no wheezing or crackles auscultated ?Abdomen-soft, nontender, no organomegaly ?Extremities-no edema in the lower extremities ?Neuro-alert, oriented x3, no focal deficit noted ? ?Condition at discharge: good ? ?The results of significant diagnostics from this hospitalization (including imaging,  microbiology, ancillary and laboratory) are listed below for reference.  ? ?Imaging Studies: ?DG Chest 2 View ? ?Result Date: 03/23/2022 ?CLINICAL DATA:  Shortness of breath. EXAM: CHEST - 2 VIEW COMPARISON:  AP

## 2022-03-31 NOTE — Progress Notes (Signed)
Orthopedic Tech Progress Note ?Patient Details:  ?[name] ?November 02, 1966 ?[id number] ?Nurse requested 2 post-op shoes for patient so that he'd be able to get onto his bus after being d/c ?Ortho Devices ?Type of Ortho Device: Postop shoe/boot ?Ortho Device/Splint Location: Bi LE ?Ortho Device/Splint Interventions: Application ?  ?Post Interventions ?Patient Tolerated: Well ? ?Sharifa Bucholz E Kare Dado ?03/31/2022, 10:44 AM ? ?

## 2022-03-31 NOTE — Progress Notes (Signed)
Pt refused night time meds. This RN tried to explain about importance of taking his meds,  pt states "no". On call notified for refusing  eliquis. ?

## 2022-04-03 ENCOUNTER — Inpatient Hospital Stay (HOSPITAL_COMMUNITY)
Admission: EM | Admit: 2022-04-03 | Discharge: 2022-04-11 | DRG: 872 | Disposition: A | Discharge status: Home / Self Care | Payer: Medicaid Other | Attending: Family Medicine | Admitting: Family Medicine

## 2022-04-03 ENCOUNTER — Encounter (HOSPITAL_COMMUNITY): Payer: Self-pay

## 2022-04-03 DIAGNOSIS — Z5900 Homelessness unspecified: Secondary | ICD-10-CM

## 2022-04-03 DIAGNOSIS — G894 Chronic pain syndrome: Secondary | ICD-10-CM | POA: Diagnosis present

## 2022-04-03 DIAGNOSIS — Z886 Allergy status to analgesic agent status: Secondary | ICD-10-CM

## 2022-04-03 DIAGNOSIS — I69322 Dysarthria following cerebral infarction: Secondary | ICD-10-CM

## 2022-04-03 DIAGNOSIS — Z79899 Other long term (current) drug therapy: Secondary | ICD-10-CM

## 2022-04-03 DIAGNOSIS — Z9151 Personal history of suicidal behavior: Secondary | ICD-10-CM

## 2022-04-03 DIAGNOSIS — I82629 Acute embolism and thrombosis of deep veins of unspecified upper extremity: Secondary | ICD-10-CM | POA: Diagnosis present

## 2022-04-03 DIAGNOSIS — E872 Acidosis, unspecified: Secondary | ICD-10-CM | POA: Diagnosis present

## 2022-04-03 DIAGNOSIS — R45851 Suicidal ideations: Secondary | ICD-10-CM

## 2022-04-03 DIAGNOSIS — Z91148 Patient's other noncompliance with medication regimen for other reason: Secondary | ICD-10-CM

## 2022-04-03 DIAGNOSIS — I87333 Chronic venous hypertension (idiopathic) with ulcer and inflammation of bilateral lower extremity: Secondary | ICD-10-CM

## 2022-04-03 DIAGNOSIS — A419 Sepsis, unspecified organism: Principal | ICD-10-CM | POA: Diagnosis present

## 2022-04-03 DIAGNOSIS — I5040 Unspecified combined systolic (congestive) and diastolic (congestive) heart failure: Secondary | ICD-10-CM | POA: Diagnosis present

## 2022-04-03 DIAGNOSIS — L03116 Cellulitis of left lower limb: Secondary | ICD-10-CM | POA: Diagnosis present

## 2022-04-03 DIAGNOSIS — K219 Gastro-esophageal reflux disease without esophagitis: Secondary | ICD-10-CM | POA: Diagnosis present

## 2022-04-03 DIAGNOSIS — F1910 Other psychoactive substance abuse, uncomplicated: Secondary | ICD-10-CM

## 2022-04-03 DIAGNOSIS — F17210 Nicotine dependence, cigarettes, uncomplicated: Secondary | ICD-10-CM | POA: Diagnosis present

## 2022-04-03 DIAGNOSIS — I69351 Hemiplegia and hemiparesis following cerebral infarction affecting right dominant side: Secondary | ICD-10-CM

## 2022-04-03 DIAGNOSIS — E1152 Type 2 diabetes mellitus with diabetic peripheral angiopathy with gangrene: Secondary | ICD-10-CM | POA: Diagnosis present

## 2022-04-03 DIAGNOSIS — F1410 Cocaine abuse, uncomplicated: Secondary | ICD-10-CM | POA: Diagnosis present

## 2022-04-03 DIAGNOSIS — I82721 Chronic embolism and thrombosis of deep veins of right upper extremity: Secondary | ICD-10-CM | POA: Diagnosis present

## 2022-04-03 DIAGNOSIS — R6520 Severe sepsis without septic shock: Secondary | ICD-10-CM | POA: Diagnosis present

## 2022-04-03 DIAGNOSIS — Z20822 Contact with and (suspected) exposure to covid-19: Secondary | ICD-10-CM | POA: Diagnosis present

## 2022-04-03 DIAGNOSIS — E119 Type 2 diabetes mellitus without complications: Secondary | ICD-10-CM | POA: Diagnosis present

## 2022-04-03 DIAGNOSIS — E1142 Type 2 diabetes mellitus with diabetic polyneuropathy: Secondary | ICD-10-CM | POA: Diagnosis present

## 2022-04-03 DIAGNOSIS — L03115 Cellulitis of right lower limb: Secondary | ICD-10-CM | POA: Diagnosis present

## 2022-04-03 DIAGNOSIS — F250 Schizoaffective disorder, bipolar type: Secondary | ICD-10-CM | POA: Diagnosis present

## 2022-04-03 DIAGNOSIS — I5042 Chronic combined systolic (congestive) and diastolic (congestive) heart failure: Secondary | ICD-10-CM | POA: Diagnosis present

## 2022-04-03 DIAGNOSIS — I959 Hypotension, unspecified: Secondary | ICD-10-CM | POA: Diagnosis present

## 2022-04-03 DIAGNOSIS — Z813 Family history of other psychoactive substance abuse and dependence: Secondary | ICD-10-CM

## 2022-04-03 DIAGNOSIS — J441 Chronic obstructive pulmonary disease with (acute) exacerbation: Secondary | ICD-10-CM | POA: Diagnosis present

## 2022-04-03 DIAGNOSIS — Z993 Dependence on wheelchair: Secondary | ICD-10-CM

## 2022-04-03 DIAGNOSIS — F1290 Cannabis use, unspecified, uncomplicated: Secondary | ICD-10-CM | POA: Diagnosis present

## 2022-04-03 DIAGNOSIS — Z91010 Allergy to peanuts: Secondary | ICD-10-CM

## 2022-04-03 DIAGNOSIS — Z888 Allergy status to other drugs, medicaments and biological substances status: Secondary | ICD-10-CM

## 2022-04-03 DIAGNOSIS — D649 Anemia, unspecified: Secondary | ICD-10-CM | POA: Diagnosis present

## 2022-04-03 DIAGNOSIS — I110 Hypertensive heart disease with heart failure: Secondary | ICD-10-CM | POA: Diagnosis present

## 2022-04-03 DIAGNOSIS — R569 Unspecified convulsions: Secondary | ICD-10-CM | POA: Diagnosis present

## 2022-04-03 DIAGNOSIS — Z91199 Patient's noncompliance with other medical treatment and regimen due to unspecified reason: Secondary | ICD-10-CM

## 2022-04-03 DIAGNOSIS — Z7901 Long term (current) use of anticoagulants: Secondary | ICD-10-CM

## 2022-04-03 LAB — ETHANOL: Alcohol, Ethyl (B): <10 mg/dL (ref <10)

## 2022-04-03 LAB — COMPREHENSIVE METABOLIC PANEL
ALT: 18 U/L (ref 0–44)
AST: 22 U/L (ref 15–41)
Albumin: 3.7 g/dL (ref 3.5–5.0)
Alkaline Phosphatase: 91 U/L (ref 38–126)
Anion gap: 15 (ref 5–15)
BUN: 23 mg/dL — ABNORMAL HIGH (ref 6–20)
CO2: 22 mmol/L (ref 22–32)
Calcium: 9.1 mg/dL (ref 8.9–10.3)
Chloride: 99 mmol/L (ref 98–111)
Creatinine, Ser: 0.99 mg/dL (ref 0.61–1.24)
GFR, Estimated: >60 mL/min (ref >60)
Glucose, Bld: 101 mg/dL — ABNORMAL HIGH (ref 70–99)
Potassium: 4 mmol/L (ref 3.5–5.1)
Sodium: 136 mmol/L (ref 135–145)
Total Bilirubin: 0.8 mg/dL (ref 0.3–1.2)
Total Protein: 8.5 g/dL — ABNORMAL HIGH (ref 6.5–8.1)

## 2022-04-03 LAB — CBC WITH DIFFERENTIAL/PLATELET
Abs Immature Granulocytes: 0.08 10*3/uL — ABNORMAL HIGH (ref 0.00–0.07)
Basophils Absolute: 0.1 10*3/uL (ref 0.0–0.1)
Basophils Relative: 0 %
Eosinophils Absolute: 0.1 10*3/uL (ref 0.0–0.5)
Eosinophils Relative: 1 %
HCT: 48.3 % (ref 39.0–52.0)
Hemoglobin: 15.3 g/dL (ref 13.0–17.0)
Immature Granulocytes: 1 %
Lymphocytes Relative: 10 %
Lymphs Abs: 1.5 10*3/uL (ref 0.7–4.0)
MCH: 30.2 pg (ref 26.0–34.0)
MCHC: 31.7 g/dL (ref 30.0–36.0)
MCV: 95.3 fL (ref 80.0–100.0)
Monocytes Absolute: 1 10*3/uL (ref 0.1–1.0)
Monocytes Relative: 6 %
Neutro Abs: 12.9 10*3/uL — ABNORMAL HIGH (ref 1.7–7.7)
Neutrophils Relative %: 82 %
Platelets: 383 10*3/uL (ref 150–400)
RBC: 5.07 MIL/uL (ref 4.22–5.81)
RDW: 14.3 % (ref 11.5–15.5)
WBC: 15.6 10*3/uL — ABNORMAL HIGH (ref 4.0–10.5)
nRBC: 0 % (ref 0.0–0.2)

## 2022-04-03 LAB — CBG MONITORING, ED: Glucose-Capillary: 113 mg/dL — ABNORMAL HIGH (ref 70–99)

## 2022-04-03 LAB — RAPID URINE DRUG SCREEN, HOSP PERFORMED
Amphetamines: NOT DETECTED
Barbiturates: NOT DETECTED
Benzodiazepines: NOT DETECTED
Cocaine: POSITIVE — AB
Opiates: NOT DETECTED
Tetrahydrocannabinol: POSITIVE — AB

## 2022-04-03 LAB — RESP PANEL BY RT-PCR (FLU A&B, COVID) ARPGX2
Influenza A by PCR: NEGATIVE
Influenza B by PCR: NEGATIVE
SARS Coronavirus 2 by RT PCR: NEGATIVE

## 2022-04-03 MED ORDER — ALBUTEROL SULFATE HFA 108 (90 BASE) MCG/ACT IN AERS: 2.0000 | INHALATION_SPRAY | Freq: Four times a day (QID) | RESPIRATORY_TRACT | Status: DC | PRN

## 2022-04-03 MED ORDER — DOXYCYCLINE HYCLATE 100 MG PO TABS
100.0000 mg | ORAL_TABLET | Freq: Two times a day (BID) | ORAL | Status: DC
  Administered 2022-04-03 – 2022-04-04 (×2): 100 mg via ORAL
  Filled 2022-04-03 (×2): qty 1

## 2022-04-03 MED ORDER — LEVETIRACETAM 500 MG PO TABS
500.0000 mg | ORAL_TABLET | Freq: Two times a day (BID) | ORAL | Status: DC
  Administered 2022-04-03 – 2022-04-11 (×16): 500 mg via ORAL
  Filled 2022-04-03 (×16): qty 1

## 2022-04-03 MED ORDER — APIXABAN 5 MG PO TABS
5.0000 mg | ORAL_TABLET | Freq: Two times a day (BID) | ORAL | Status: DC
  Administered 2022-04-03 – 2022-04-05 (×4): 5 mg via ORAL
  Filled 2022-04-03 (×4): qty 1

## 2022-04-03 MED ORDER — GABAPENTIN 300 MG PO CAPS
300.0000 mg | ORAL_CAPSULE | Freq: Three times a day (TID) | ORAL | Status: DC
  Administered 2022-04-03 – 2022-04-04 (×3): 300 mg via ORAL
  Filled 2022-04-03 (×3): qty 1

## 2022-04-03 MED ORDER — HYDROXYZINE HCL 10 MG PO TABS
10.0000 mg | ORAL_TABLET | Freq: Once | ORAL | Status: AC
  Administered 2022-04-03: 10 mg via ORAL
  Filled 2022-04-03: qty 1

## 2022-04-03 MED ORDER — CARVEDILOL 3.125 MG PO TABS
3.1250 mg | ORAL_TABLET | Freq: Two times a day (BID) | ORAL | Status: DC
  Administered 2022-04-04 – 2022-04-11 (×13): 3.125 mg via ORAL
  Filled 2022-04-03 (×13): qty 1

## 2022-04-03 MED ORDER — UMECLIDINIUM BROMIDE 62.5 MCG/ACT IN AEPB
1.0000 | INHALATION_SPRAY | Freq: Every day | RESPIRATORY_TRACT | Status: DC
  Administered 2022-04-04 – 2022-04-08 (×4): 1 via RESPIRATORY_TRACT
  Filled 2022-04-03: qty 7

## 2022-04-03 MED ORDER — TIOTROPIUM BROMIDE MONOHYDRATE 18 MCG IN CAPS: 18.0000 ug | ORAL_CAPSULE | Freq: Every day | RESPIRATORY_TRACT | Status: DC

## 2022-04-03 MED ORDER — FOLIC ACID 1 MG PO TABS
1.0000 mg | ORAL_TABLET | Freq: Every day | ORAL | Status: DC
  Administered 2022-04-03 – 2022-04-11 (×9): 1 mg via ORAL
  Filled 2022-04-03 (×9): qty 1

## 2022-04-03 MED ORDER — SPIRONOLACTONE 12.5 MG HALF TABLET
12.5000 mg | ORAL_TABLET | Freq: Every day | ORAL | Status: DC
  Administered 2022-04-03: 12.5 mg via ORAL
  Filled 2022-04-03 (×3): qty 1

## 2022-04-03 MED ORDER — ISOSORBIDE MONONITRATE ER 60 MG PO TB24
60.0000 mg | ORAL_TABLET | Freq: Every day | ORAL | Status: DC
  Administered 2022-04-03: 60 mg via ORAL
  Filled 2022-04-03 (×2): qty 1

## 2022-04-03 MED ORDER — HYDRALAZINE HCL 25 MG PO TABS
25.0000 mg | ORAL_TABLET | Freq: Three times a day (TID) | ORAL | Status: DC
  Administered 2022-04-03 – 2022-04-04 (×2): 25 mg via ORAL
  Filled 2022-04-03 (×3): qty 1

## 2022-04-03 MED ORDER — ATORVASTATIN CALCIUM 40 MG PO TABS
40.0000 mg | ORAL_TABLET | Freq: Every day | ORAL | Status: DC
  Administered 2022-04-03 – 2022-04-11 (×9): 40 mg via ORAL
  Filled 2022-04-03 (×9): qty 1

## 2022-04-03 MED ORDER — ALBUTEROL SULFATE (2.5 MG/3ML) 0.083% IN NEBU
2.5000 mg | INHALATION_SOLUTION | Freq: Four times a day (QID) | RESPIRATORY_TRACT | Status: DC | PRN
  Administered 2022-04-05 – 2022-04-10 (×6): 2.5 mg via RESPIRATORY_TRACT
  Filled 2022-04-03 (×8): qty 3

## 2022-04-03 MED ORDER — OXYCODONE HCL 5 MG PO TABS
5.0000 mg | ORAL_TABLET | Freq: Four times a day (QID) | ORAL | Status: DC | PRN
  Administered 2022-04-04 (×2): 5 mg via ORAL
  Filled 2022-04-03 (×3): qty 1

## 2022-04-03 MED ORDER — FUROSEMIDE 40 MG PO TABS
40.0000 mg | ORAL_TABLET | Freq: Two times a day (BID) | ORAL | Status: DC
  Administered 2022-04-04 – 2022-04-11 (×13): 40 mg via ORAL
  Filled 2022-04-03 (×14): qty 1

## 2022-04-03 MED ORDER — THIAMINE HCL 100 MG PO TABS
100.0000 mg | ORAL_TABLET | Freq: Every day | ORAL | Status: DC
Start: 2022-04-03 — End: 2022-04-11
  Administered 2022-04-03 – 2022-04-11 (×9): 100 mg via ORAL
  Filled 2022-04-03 (×9): qty 1

## 2022-04-03 MED ORDER — IRBESARTAN 75 MG PO TABS
75.0000 mg | ORAL_TABLET | Freq: Every day | ORAL | Status: DC
  Administered 2022-04-03: 75 mg via ORAL
  Filled 2022-04-03 (×2): qty 1

## 2022-04-03 MED ORDER — GABAPENTIN 400 MG PO CAPS
400.0000 mg | ORAL_CAPSULE | Freq: Three times a day (TID) | ORAL | Status: DC
  Administered 2022-04-03 – 2022-04-11 (×21): 400 mg via ORAL
  Filled 2022-04-03 (×24): qty 1

## 2022-04-03 MED ORDER — OLANZAPINE 5 MG PO TABS
5.0000 mg | ORAL_TABLET | Freq: Every day | ORAL | Status: DC
  Administered 2022-04-03 – 2022-04-05 (×3): 5 mg via ORAL
  Filled 2022-04-03 (×3): qty 1

## 2022-04-03 NOTE — ED Triage Notes (Signed)
Pt arrived via EMS, c/o  bilateral leg pain. ?

## 2022-04-03 NOTE — ED Notes (Signed)
Attempted blood draw x2 without success.

## 2022-04-03 NOTE — Progress Notes (Signed)
Patient has been faxed out due to staffing issues at [facility]. Patient meets BH inpatient criteria per Nira Conn, NP. Patient has been faxed out to the following facilities:  ? ?[facility]  [address] [place] [name] [address] [phone]  ?[facility]  [address] [name] [address] [phone]  ?[facility]  [address], [name] [address] [phone]  ?[facility]  [address] [name] [address] [phone]  ?[facility]  [address] [place], [name] [address] [phone]  ?[facility]  [address] [place], [name] [address] [phone]  ?[facility]  [address] [name] [address] [phone]  ?[facility]  [address] [name] [address] [phone]  ?[facility]  [address] [name] [address] [phone]  ?[facility]  [address] [name] [address] [phone]  ? ?Damita Dunnings, MSW, [name]-A  ?11:34 PM 04/03/2022   ?

## 2022-04-03 NOTE — ED Provider Triage Note (Signed)
Emergency Medicine Provider Triage Evaluation Note ? ?[name] , a 56 y.o. male  was evaluated in triage.  Pt complains of bilateral foot pai and suicidal ideation.  Patient has extensive history of issues with his foot states that he currently has gangrene.  He has been seen at wound care but feels that they have not been helping him.  Patient states that he wants to kill himself by either cutting his feet off or going out into traffic.  Patient denies shortness of breath, chest pain, abdominal pain, nausea at this time.  Endorses bilateral foot pain and suicidal ideation.  Denies homicidal ideation, visual or auditory hallucination. ? ?Review of Systems  ?Positive: Bilateral foot pain, SI ?Negative: Nausea, HI, hallucinations, shortness of breath, chest pain ? ?Physical Exam  ?BP (!) 130/102 (BP Location: Right Arm)   Pulse (!) 102   Temp 98 ?F (36.7 ?C) (Oral)   Resp 20   SpO2 100%  ?Gen:   Awake, no distress   ?Resp:  Normal effort  ?MSK:   Moves extremities without difficulty  ?Other:  Bilateral feet wrapped in gauze.  No obvious wound seepage through gauze at this time ? ?Medical Decision Making  ?Medically screening exam initiated at 2:06 PM.  Appropriate orders placed.  [name] was informed that the remainder of the evaluation will be completed by another provider, this initial triage assessment does not replace that evaluation, and the importance of remaining in the ED until their evaluation is complete. ? ? ?  ?Darrick Grinder, PA-C ?04/03/22 1408 ? ?

## 2022-04-03 NOTE — BH Assessment (Signed)
Comprehensive Clinical Assessment (CCA) Note ? ?04/03/2022 ?[name] ?[id number] ? ?Disposition: Lindon Romp, PMHNP recommends inpatient treatment. Per NP no available bed, disposition CSW to seek placement. Disposition discussed with [name], RN. ? ?[facility] ED from 04/03/2022 in [place] DEPT ED to Hosp-Admission (Discharged) from 03/23/2022 in [facility] ED from 03/20/2022 in [facility]  ?C-SSRS RISK CATEGORY High Risk High Risk No Risk  ? ?  ? ?The patient demonstrates the following risk factors for suicide: Chronic risk factors for suicide include: psychiatric disorder of Schizoaffective Disorder, Bipolar Type and previous suicide attempts Pt reports, previous suicide attempts . Acute risk factors for suicide include:  Pt is in chronic pain which is  . Protective factors for this patient include:  None . Considering these factors, the overall suicide risk at this point appears to be high. Patient is appropriate for outpatient follow up. ? ?[name] is a 56 year old male who presents voluntary and unaccompanied to [facility]. Clinician asked the pt, "what brought you to the hospital?" Pt reports, he's in pain, "real bad." Pt reports, his left foot is gangrene, bleeding. Per pt, "I can't take all of this." Pt reports, he has ongoing suicidal thoughts with a plan to roll his wheelchair in traffic or cut off his legs so he can be out of pain. Pt reports, he talked to a doctor who laughed at him (pt did not disclose the name of the doctor). Pt reports, he wants something for pain, he was given Gabapentin, he's allergic to Tylenol and Ibuprofen. Clinician asked the pt if he experienced, AVH, pt replied, "I don't know." Pt denies, HI, self-injurious behaviors and access to weapons.  ? ?Pt reports, using Alcohol and Cocaine two days ago. Pt denies, being linked to OPT resources (medication  management and/or counseling.)  ? ?Pt presents rubbing his left leg complaining for chronic foot pain. Clinician observed the pt becoming antsy when answering questions while thriving in pain. Pt's mood, affect was depressed. Pt's insight was fair. Pt's judgement was poor. Pt reports, if discharged he will try to complete his suicide plans.  ? ?Diagnosis: Schizoaffective Disorder, Bipolar Type. ? ?*Clinician asked the pt if he has family/friend supports, pt replied, "I don't deal with them."* ? ?Chief Complaint:  ?Chief Complaint  ?Patient presents with  ? Leg Pain  ? ?Visit Diagnosis:   ? ? ?CCA Screening, Triage and Referral (STR) ? ?Patient Reported Information ?How did you hear about [place]? Self ? ?What Is the Reason for Your Visit/Call Today? Per EDP note: "Patient presents with request for assistance with suicidal ideation. He knowledges multiple medical issues including multiple prior amputations of distal digits, poorly healing wounds on both distal lower extremities. He states that he has pain in both of these feet, but primarily is concerned with thoughts of killing himself by going into traffic. No reported fever, fall, other new complaints." ? ?How Long Has This Been Causing You Problems? > than 6 months ? ?What Do You Feel Would Help You the Most Today? Alcohol or Drug Use Treatment; Treatment for Depression or other mood problem; Housing Assistance ? ? ?Have You Recently Had Any Thoughts About Hurting Yourself? Yes ? ?Are You Planning to Commit Suicide/Harm Yourself At This time? Yes ? ? ?Have you Recently Had Thoughts About [place]? No ? ?Are You Planning to Harm Someone at This Time? No ? ?Explanation: No data recorded ? ?  Have You Used Any Alcohol or Drugs in the Past 24 Hours? Yes ? ?How Long Ago Did You Use Drugs or Alcohol? No data recorded ?What Did You Use and How Much? UTA ? ? ?Do You Currently Have a Therapist/Psychiatrist? No ? ?Name of Therapist/Psychiatrist: No data  recorded ? ?Have You Been Recently Discharged From Any Office Practice or Programs? No ? ?Explanation of Discharge From Practice/Program: No data recorded ? ?  ?CCA Screening Triage Referral Assessment ?Type of Contact: Tele-Assessment ? ?Telemedicine Service Delivery: Telemedicine service delivery: This service was provided via telemedicine using a 2-way, interactive audio and video technology ? ?Is this Initial or Reassessment? Initial Assessment ? ?Date Telepsych consult ordered in [facility]:  04/03/22 ? ?Time Telepsych consult ordered in [facility]:  1410 ? ?Location of Assessment: [facility] ED ? ?Provider Location: [name] Assessment Services ? ? ?Collateral Involvement: Pt reports, "I don't deal with them." ? ? ?Does Patient Have a Stage manager Guardian? No data recorded ?Name and Contact of Legal Guardian: No data recorded ?If Minor and Not Living with Parent(s), Who has Custody? N/A ? ?Is CPS involved or ever been involved? Never ? ?Is APS involved or ever been involved? Never ? ? ?Patient Determined To Be At Risk for Harm To Self or Others Based on Review of Patient Reported Information or Presenting Complaint? Yes, for Self-Harm ? ?Method: No data recorded ?Availability of Means: No data recorded ?Intent: No data recorded ?Notification Required: No data recorded ?Additional Information for Danger to Others Potential: No data recorded ?Additional Comments for Danger to Others Potential: No data recorded ?Are There Guns or Other Weapons in [place]? No data recorded ?Types of Guns/Weapons: No data recorded ?Are These Weapons Safely Secured?                            No data recorded ?Who Could Verify You Are Able To Have These Secured: No data recorded ?Do You Have any Outstanding Charges, Pending Court Dates, Parole/Probation? No data recorded ?Contacted To Inform of Risk of Harm To Self or Others: Law Enforcement ? ? ? ?Does Patient Present under Involuntary Commitment? No ? ?IVC Papers Initial File Date:  07/03/21 ? ? ?[place] of Residence: Kathleen Argue ? ? ?Patient Currently Receiving the Following Services: Not Receiving Services ? ? ?Determination of Need: Emergent (2 hours) ? ? ?Options For Referral: Inpatient Hospitalization ? ? ? ? ?CCA Biopsychosocial ?Patient Reported Schizophrenia/Schizoaffective Diagnosis in Past: Yes ? ? ?Strengths: Pt is able to identify he requires assistance for his mental health concerns. Pt is able to seek medical care for his medical concerns. ? ? ?Mental Health Symptoms ?Depression:   ?Irritability; Hopelessness; Worthlessness; Difficulty Concentrating; Sleep (too much or little); Fatigue ?  ?Duration of Depressive symptoms:    ?Mania:   ?None ?  ?Anxiety:    ?Worrying; Tension ?  ?Psychosis:   ?None (Pt reports, "I don't know.") ?  ?Duration of Psychotic symptoms:    ?Trauma:   ?None ?  ?Obsessions:   ?None ?  ?Compulsions:   ?None ?  ?Inattention:   ?-- (UTA) ?  ?Hyperactivity/Impulsivity:   ?-- (UTA) ?  ?Oppositional/Defiant Behaviors:   ?Angry ?  ?Emotional Irregularity:   ?Recurrent suicidal behaviors/gestures/threats ?  ?Other Mood/Personality Symptoms:   ?None noted ?  ? ?Mental Status Exam ?Appearance and self-care  ?Stature:   ?Average ?  ?Weight:   ?Average weight ?  ?Clothing:   ?-- (Pt in  scrubs.) ?  ?Grooming:   ?Normal ?  ?Cosmetic use:   ?None ?  ?Posture/gait:   ?Normal ?  ?Motor activity:   ?Not Remarkable ?  ?Sensorium  ?Attention:   ?Normal ?  ?Concentration:   ?Normal ?  ?Orientation:   ?X5 ?  ?Recall/memory:   ?Normal ?  ?Affect and Mood  ?Affect:   ?Depressed ?  ?Mood:   ?Depressed ?  ?Relating  ?Eye contact:   ?Normal ?  ?Facial expression:   ?Depressed ?  ?Attitude toward examiner:   ?Cooperative ?  ?Thought and Language  ?Speech flow:  ?Normal ?  ?Thought content:   ?Appropriate to Mood and Circumstances ?  ?Preoccupation:   ?None ?  ?Hallucinations:   ?Other (Comment) (Pt reports, "I don't know.") ?  ?Organization:  No data recorded  ?Executive Functions  ?Fund  of Knowledge:   ?[place] ?  ?Intelligence:   ?Average ?  ?Abstraction:   ?Normal ?  ?Judgement:   ?Poor ?  ?Reality Testing:   ?Adequate ?  ?Insight:   ?Fair ?  ?Decision Making:   ?Impulsive ?  ?Social Functioning  ?Social Maturity:

## 2022-04-03 NOTE — BH Assessment (Signed)
Clinician messaged Kiristin N. Ryan,RN: "Hey. It's Trey with TTS. Is the pt able to engage in the assessment, if so the pt will need to be placed in a private room. Also is the pt under IVC?"  ? ? ?Clinician awaiting response.  ? ? ?Redmond Pulling, MS, [facility], [facility] ?Triage Specialist ?[phone] ? ?

## 2022-04-03 NOTE — ED Provider Notes (Addendum)
?[facility]-EMERGENCY DEPT ?Provider Note ? ? ?CSN: [id number] ?Arrival date & time: 04/03/22  1345 ? ?  ? ?History ? ?Chief Complaint  ?Patient presents with  ? Leg Pain  ? ? ?[name] is a 56 y.o. male. ? ?HPI ?Patient presents with request for assistance with suicidal ideation.  He knowledges multiple medical issues including multiple prior amputations of distal digits, poorly healing wounds on both distal lower extremities.  He states that he has pain in both of these feet, but primarily is concerned with thoughts of killing himself by going into traffic.  No reported fever, fall, other new complaints. ?  ? ?Home Medications ?Prior to Admission medications   ?Medication Sig Start Date End Date Taking? Authorizing Provider  ?albuterol (VENTOLIN HFA) 108 (90 Base) MCG/ACT inhaler Inhale 2 puffs into the lungs every 6 (six) hours as needed for wheezing or shortness of breath. 03/07/22   Meredeth Ide, MD  ?apixaban (ELIQUIS) 5 MG TABS tablet Take 2 tablets by mouth 2 times daily x 7 days, then start taking 1 tablet by mouth  twice daily from 04/07/22 04/06/22   Meredeth Ide, MD  ?atorvastatin (LIPITOR) 10 MG tablet Take 4 tablets (40 mg total) by mouth daily. 03/07/22   Meredeth Ide, MD  ?carvedilol (COREG) 3.125 MG tablet Take 1 tablet (3.125 mg total) by mouth 2 (two) times daily with a meal. 03/07/22   Meredeth Ide, MD  ?folic acid (FOLVITE) 1 MG tablet Take 1 tablet (1 mg total) by mouth daily. 03/08/22   Meredeth Ide, MD  ?furosemide (LASIX) 40 MG tablet Take 1 tablet (40 mg total) by mouth 2 (two) times daily. 03/07/22 03/07/23  Meredeth Ide, MD  ?gabapentin (NEURONTIN) 400 MG capsule Take 1 capsule (400 mg total) by mouth 3 (three) times daily. 03/07/22   Meredeth Ide, MD  ?hydrALAZINE (APRESOLINE) 25 MG tablet Take 1 tablet (25 mg total) by mouth 3 (three) times daily. 03/07/22 07/05/22  Meredeth Ide, MD  ?isosorbide mononitrate (IMDUR) 60 MG 24 hr tablet Take 1 tablet (60 mg total) by mouth daily.  03/07/22 07/05/22  Meredeth Ide, MD  ?levETIRAcetam (KEPPRA) 500 MG tablet Take 1 tablet (500 mg total) by mouth 2 (two) times daily. 03/07/22 04/06/22  Meredeth Ide, MD  ?OLANZapine (ZYPREXA) 5 MG tablet Take 1 tablet (5 mg total) by mouth daily. 03/07/22   Meredeth Ide, MD  ?oxyCODONE (OXY IR/ROXICODONE) 5 MG immediate release tablet Take 1-2 tablets (5-10 mg total) by mouth every 6 (six) hours as needed for severe pain or breakthrough pain. 03/31/22   Meredeth Ide, MD  ?spironolactone (ALDACTONE) 25 MG tablet Take 1/2 tablet (12.5 mg total) by mouth daily. 03/08/22   Meredeth Ide, MD  ?thiamine 100 MG tablet Take 1 tablet (100 mg total) by mouth daily. 03/08/22   Meredeth Ide, MD  ?tiotropium (SPIRIVA HANDIHALER) 18 MCG inhalation capsule Place 1 capsule (18 mcg total) into inhaler and inhale daily as directed. 03/07/22 06/05/22  Meredeth Ide, MD  ?valsartan (DIOVAN) 40 MG tablet Take 1/2 tablet (20 mg total) by mouth 2 (two) times daily. 03/07/22 03/07/23  Meredeth Ide, MD  ?   ? ?Allergies    ?Ibuprofen, Acetaminophen, Lyrica cr [pregabalin er], and Peanut-containing drug products   ? ?Review of Systems   ?Review of Systems  ?All other systems reviewed and are negative. ? ?Physical Exam ?Updated Vital Signs ?BP (!) 130/102 (  BP Location: Right Arm)   Pulse (!) 102   Temp 98 ?F (36.7 ?C) (Oral)   Resp 20   SpO2 100%  ?Physical Exam ?Vitals and nursing note reviewed.  ?Constitutional:   ?   General: He is not in acute distress. ?   Appearance: He is well-developed. He is ill-appearing. He is not toxic-appearing or diaphoretic.  ?HENT:  ?   Head: Normocephalic and atraumatic.  ?Eyes:  ?   Conjunctiva/sclera: Conjunctivae normal.  ?Cardiovascular:  ?   Rate and Rhythm: Normal rate and regular rhythm.  ?Pulmonary:  ?   Effort: Pulmonary effort is normal. No respiratory distress.  ?   Breath sounds: No stridor.  ?Abdominal:  ?   General: There is no distension.  ?Musculoskeletal:  ?   Comments: Both distal lower extremities have  wound wrapping in place.  Exposed left toes have evidence for prior amputation without evidence for obvious infection, no drainage, bleeding, skin color changes.  ?Skin: ?   General: Skin is warm and dry.  ?Neurological:  ?   Mental Status: He is alert and oriented to person, place, and time.  ?Psychiatric:     ?   Mood and Affect: Affect is labile.     ?   Thought Content: Thought content includes suicidal ideation.  ? ? ? ? ? ?ED Results / Procedures / Treatments   ?Labs ?(all labs ordered are listed, but only abnormal results are displayed) ?Labs Reviewed  ?COMPREHENSIVE METABOLIC PANEL - Abnormal; Notable for the following components:  ?    Result Value  ? Glucose, Bld 101 (*)   ? BUN 23 (*)   ? Total Protein 8.5 (*)   ? All other components within normal limits  ?CBC WITH DIFFERENTIAL/PLATELET - Abnormal; Notable for the following components:  ? WBC 15.6 (*)   ? Neutro Abs 12.9 (*)   ? Abs Immature Granulocytes 0.08 (*)   ? All other components within normal limits  ?CBG MONITORING, ED - Abnormal; Notable for the following components:  ? Glucose-Capillary 113 (*)   ? All other components within normal limits  ?RESP PANEL BY RT-PCR (FLU A&B, COVID) ARPGX2  ?ETHANOL  ?RAPID URINE DRUG SCREEN, HOSP PERFORMED  ? ? ?EKG ?EKG Interpretation ? ?Date/Time:  Thursday Apr 03 2022 15:35:51 EDT ?Ventricular Rate:  105 ?PR Interval:  172 ?QRS Duration: 86 ?QT Interval:  352 ?QTC Calculation: 465 ?R Axis:   -6 ?Text Interpretation: Sinus tachycardia Moderate voltage criteria for LVH, may be normal variant ( Sokolow-Lyon , Cornell product ) ST-t wave abnormality Abnormal ECG Confirmed by Gerhard Munch [phone]) on 04/03/2022 6:53:08 PM ? ?Radiology ?No results found. ? ?Procedures ?Procedures  ? ? ?Medications Ordered in ED ?Medications  ?gabapentin (NEURONTIN) capsule 300 mg (300 mg Oral Given 04/03/22 1725)  ?albuterol (VENTOLIN HFA) 108 (90 Base) MCG/ACT inhaler 2 puff (has no administration in time range)  ?apixaban (ELIQUIS)  tablet 5 mg (has no administration in time range)  ?atorvastatin (LIPITOR) tablet 40 mg (40 mg Oral Given 04/03/22 2034)  ?carvedilol (COREG) tablet 3.125 mg (has no administration in time range)  ?folic acid (FOLVITE) tablet 1 mg (1 mg Oral Given 04/03/22 2034)  ?furosemide (LASIX) tablet 40 mg (has no administration in time range)  ?gabapentin (NEURONTIN) capsule 400 mg (has no administration in time range)  ?hydrALAZINE (APRESOLINE) tablet 25 mg (has no administration in time range)  ?isosorbide mononitrate (IMDUR) 24 hr tablet 60 mg (has no administration in time range)  ?levETIRAcetam (  KEPPRA) tablet 500 mg (has no administration in time range)  ?OLANZapine (ZYPREXA) tablet 5 mg (5 mg Oral Given 04/03/22 2034)  ?oxyCODONE (Oxy IR/ROXICODONE) immediate release tablet 5 mg (has no administration in time range)  ?spironolactone (ALDACTONE) tablet 12.5 mg (has no administration in time range)  ?thiamine tablet 100 mg (100 mg Oral Given 04/03/22 2034)  ?tiotropium (SPIRIVA) inhalation capsule ([facility] use ONLY) 18 mcg (has no administration in time range)  ?irbesartan (AVAPRO) tablet 75 mg (has no administration in time range)  ? ? ?ED Course/ Medical Decision Making/ A&P ?This patient with a Hx of multiple medical issues including seizures, homelessness, heart failure, COPD, prior amputations and depression, presents to the ED for concern of suicidal ideation with ongoing lower extremity pain, this involves an extensive number of treatment options, and is a complaint that carries with it a high risk of complications and morbidity.   ? ?The differential diagnosis includes treatment, sepsis, osteomyelitis, malingering, suicidal intent ? ? ?Social Determinants of Health: ? ?As above, homelessness, polysubstance abuse, psychiatric disease ? ?Additional history obtained: ? ?Additional history and/or information obtained from chart review, notable for discharged from this facility 3 days ago with discharge diagnosis as  below ? ?Discharge Diagnoses: ?Principal Problem: ?  Bilateral lower extremity pain ?Active Problems: ?  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  Swelling of lower extremity ?

## 2022-04-04 ENCOUNTER — Other Ambulatory Visit: Payer: Self-pay

## 2022-04-04 DIAGNOSIS — G894 Chronic pain syndrome: Secondary | ICD-10-CM | POA: Diagnosis present

## 2022-04-04 DIAGNOSIS — F1210 Cannabis abuse, uncomplicated: Secondary | ICD-10-CM | POA: Diagnosis not present

## 2022-04-04 DIAGNOSIS — D649 Anemia, unspecified: Secondary | ICD-10-CM | POA: Diagnosis present

## 2022-04-04 DIAGNOSIS — E1142 Type 2 diabetes mellitus with diabetic polyneuropathy: Secondary | ICD-10-CM | POA: Diagnosis present

## 2022-04-04 DIAGNOSIS — L0390 Cellulitis, unspecified: Secondary | ICD-10-CM | POA: Diagnosis not present

## 2022-04-04 DIAGNOSIS — I110 Hypertensive heart disease with heart failure: Secondary | ICD-10-CM | POA: Diagnosis present

## 2022-04-04 DIAGNOSIS — Z20822 Contact with and (suspected) exposure to covid-19: Secondary | ICD-10-CM | POA: Diagnosis present

## 2022-04-04 DIAGNOSIS — I69351 Hemiplegia and hemiparesis following cerebral infarction affecting right dominant side: Secondary | ICD-10-CM | POA: Diagnosis not present

## 2022-04-04 DIAGNOSIS — I87333 Chronic venous hypertension (idiopathic) with ulcer and inflammation of bilateral lower extremity: Secondary | ICD-10-CM | POA: Diagnosis not present

## 2022-04-04 DIAGNOSIS — F1410 Cocaine abuse, uncomplicated: Secondary | ICD-10-CM | POA: Diagnosis present

## 2022-04-04 DIAGNOSIS — R6520 Severe sepsis without septic shock: Secondary | ICD-10-CM | POA: Diagnosis present

## 2022-04-04 DIAGNOSIS — I82721 Chronic embolism and thrombosis of deep veins of right upper extremity: Secondary | ICD-10-CM | POA: Diagnosis present

## 2022-04-04 DIAGNOSIS — L03116 Cellulitis of left lower limb: Secondary | ICD-10-CM | POA: Diagnosis present

## 2022-04-04 DIAGNOSIS — J441 Chronic obstructive pulmonary disease with (acute) exacerbation: Secondary | ICD-10-CM | POA: Diagnosis present

## 2022-04-04 DIAGNOSIS — F17210 Nicotine dependence, cigarettes, uncomplicated: Secondary | ICD-10-CM | POA: Diagnosis present

## 2022-04-04 DIAGNOSIS — E872 Acidosis, unspecified: Secondary | ICD-10-CM | POA: Diagnosis present

## 2022-04-04 DIAGNOSIS — R569 Unspecified convulsions: Secondary | ICD-10-CM | POA: Diagnosis present

## 2022-04-04 DIAGNOSIS — F32A Depression, unspecified: Secondary | ICD-10-CM | POA: Diagnosis not present

## 2022-04-04 DIAGNOSIS — R45851 Suicidal ideations: Secondary | ICD-10-CM | POA: Diagnosis present

## 2022-04-04 DIAGNOSIS — A419 Sepsis, unspecified organism: Secondary | ICD-10-CM | POA: Diagnosis present

## 2022-04-04 DIAGNOSIS — E1152 Type 2 diabetes mellitus with diabetic peripheral angiopathy with gangrene: Secondary | ICD-10-CM | POA: Diagnosis present

## 2022-04-04 DIAGNOSIS — F250 Schizoaffective disorder, bipolar type: Secondary | ICD-10-CM | POA: Diagnosis present

## 2022-04-04 DIAGNOSIS — I82621 Acute embolism and thrombosis of deep veins of right upper extremity: Secondary | ICD-10-CM | POA: Diagnosis not present

## 2022-04-04 DIAGNOSIS — L03115 Cellulitis of right lower limb: Secondary | ICD-10-CM | POA: Diagnosis present

## 2022-04-04 DIAGNOSIS — F1290 Cannabis use, unspecified, uncomplicated: Secondary | ICD-10-CM | POA: Diagnosis present

## 2022-04-04 DIAGNOSIS — I69322 Dysarthria following cerebral infarction: Secondary | ICD-10-CM | POA: Diagnosis not present

## 2022-04-04 DIAGNOSIS — I959 Hypotension, unspecified: Secondary | ICD-10-CM | POA: Diagnosis present

## 2022-04-04 DIAGNOSIS — Z5900 Homelessness unspecified: Secondary | ICD-10-CM | POA: Diagnosis not present

## 2022-04-04 DIAGNOSIS — I5042 Chronic combined systolic (congestive) and diastolic (congestive) heart failure: Secondary | ICD-10-CM | POA: Diagnosis present

## 2022-04-04 LAB — CBC WITH DIFFERENTIAL/PLATELET
Abs Immature Granulocytes: 0.03 10*3/uL (ref 0.00–0.07)
Basophils Absolute: 0.1 10*3/uL (ref 0.0–0.1)
Basophils Relative: 1 %
Eosinophils Absolute: 0.2 10*3/uL (ref 0.0–0.5)
Eosinophils Relative: 2 %
HCT: 37 % — ABNORMAL LOW (ref 39.0–52.0)
Hemoglobin: 12.5 g/dL — ABNORMAL LOW (ref 13.0–17.0)
Immature Granulocytes: 0 %
Lymphocytes Relative: 18 %
Lymphs Abs: 1.9 10*3/uL (ref 0.7–4.0)
MCH: 31.3 pg (ref 26.0–34.0)
MCHC: 33.8 g/dL (ref 30.0–36.0)
MCV: 92.5 fL (ref 80.0–100.0)
Monocytes Absolute: 0.9 10*3/uL (ref 0.1–1.0)
Monocytes Relative: 9 %
Neutro Abs: 7.6 10*3/uL (ref 1.7–7.7)
Neutrophils Relative %: 70 %
Platelets: 338 10*3/uL (ref 150–400)
RBC: 4 MIL/uL — ABNORMAL LOW (ref 4.22–5.81)
RDW: 14.3 % (ref 11.5–15.5)
WBC: 10.6 10*3/uL — ABNORMAL HIGH (ref 4.0–10.5)
nRBC: 0 % (ref 0.0–0.2)

## 2022-04-04 LAB — LACTIC ACID, PLASMA
Lactic Acid, Venous: 2.7 mmol/L (ref 0.5–1.9)
Lactic Acid, Venous: 1.6 mmol/L (ref 0.5–1.9)

## 2022-04-04 LAB — BASIC METABOLIC PANEL
Anion gap: 9 (ref 5–15)
BUN: 15 mg/dL (ref 6–20)
CO2: 25 mmol/L (ref 22–32)
Calcium: 8.3 mg/dL — ABNORMAL LOW (ref 8.9–10.3)
Chloride: 104 mmol/L (ref 98–111)
Creatinine, Ser: 0.97 mg/dL (ref 0.61–1.24)
GFR, Estimated: >60 mL/min (ref >60)
Glucose, Bld: 123 mg/dL — ABNORMAL HIGH (ref 70–99)
Potassium: 3.9 mmol/L (ref 3.5–5.1)
Sodium: 138 mmol/L (ref 135–145)

## 2022-04-04 LAB — GLUCOSE, CAPILLARY
Glucose-Capillary: 123 mg/dL — ABNORMAL HIGH (ref 70–99)
Glucose-Capillary: 142 mg/dL — ABNORMAL HIGH (ref 70–99)

## 2022-04-04 MED ORDER — SODIUM CHLORIDE 0.9 % IV SOLN
2.0000 g | Freq: Once | INTRAVENOUS | Status: AC
  Administered 2022-04-04: 2 g via INTRAVENOUS
  Filled 2022-04-04: qty 20

## 2022-04-04 MED ORDER — VANCOMYCIN HCL IN DEXTROSE 1-5 GM/200ML-% IV SOLN
1000.0000 mg | Freq: Two times a day (BID) | INTRAVENOUS | Status: AC
  Administered 2022-04-04 – 2022-04-10 (×13): 1000 mg via INTRAVENOUS
  Filled 2022-04-04 (×13): qty 200

## 2022-04-04 MED ORDER — SODIUM CHLORIDE 0.9 % IV BOLUS (SEPSIS)
500.0000 mL | Freq: Once | INTRAVENOUS | Status: AC
  Administered 2022-04-04: 500 mL via INTRAVENOUS

## 2022-04-04 MED ORDER — HYDROMORPHONE HCL 1 MG/ML IJ SOLN
1.0000 mg | INTRAMUSCULAR | Status: AC | PRN
  Administered 2022-04-04 – 2022-04-05 (×2): 1 mg via INTRAVENOUS
  Filled 2022-04-04 (×2): qty 1

## 2022-04-04 MED ORDER — VANCOMYCIN HCL IN DEXTROSE 1-5 GM/200ML-% IV SOLN
1000.0000 mg | Freq: Once | INTRAVENOUS | Status: AC
  Administered 2022-04-04: 1000 mg via INTRAVENOUS
  Filled 2022-04-04: qty 200

## 2022-04-04 MED ORDER — SODIUM CHLORIDE 0.9 % IV BOLUS
250.0000 mL | Freq: Once | INTRAVENOUS | Status: AC
  Administered 2022-04-04: 250 mL via INTRAVENOUS

## 2022-04-04 MED ORDER — OXYCODONE HCL 5 MG PO TABS: 5.0000 mg | ORAL_TABLET | ORAL | Status: DC | PRN

## 2022-04-04 MED ORDER — INSULIN ASPART 100 UNIT/ML IJ SOLN
0.0000 [IU] | Freq: Three times a day (TID) | INTRAMUSCULAR | Status: DC
  Administered 2022-04-04: 2 [IU] via SUBCUTANEOUS
  Administered 2022-04-06: 3 [IU] via SUBCUTANEOUS
  Administered 2022-04-06: 2 [IU] via SUBCUTANEOUS
  Administered 2022-04-07: 3 [IU] via SUBCUTANEOUS
  Administered 2022-04-07: 2 [IU] via SUBCUTANEOUS
  Administered 2022-04-08 – 2022-04-09 (×2): 3 [IU] via SUBCUTANEOUS
  Administered 2022-04-11: 2 [IU] via SUBCUTANEOUS

## 2022-04-04 MED ORDER — LACTATED RINGERS IV SOLN: INTRAVENOUS | Status: AC

## 2022-04-04 MED ORDER — SODIUM CHLORIDE 0.9 % IV SOLN
1.0000 g | INTRAVENOUS | Status: AC
  Administered 2022-04-05 – 2022-04-10 (×6): 1 g via INTRAVENOUS
  Filled 2022-04-04 (×7): qty 10

## 2022-04-04 MED ORDER — LACTATED RINGERS IV BOLUS (SEPSIS)
500.0000 mL | Freq: Once | INTRAVENOUS | Status: AC
  Administered 2022-04-04: 500 mL via INTRAVENOUS

## 2022-04-04 MED ORDER — HYDROMORPHONE HCL 1 MG/ML IJ SOLN: 1.0000 mg | Freq: Once | INTRAMUSCULAR | Status: DC

## 2022-04-04 MED ORDER — LACTATED RINGERS IV SOLN: INTRAVENOUS | Status: DC

## 2022-04-04 MED ORDER — PROCHLORPERAZINE EDISYLATE 10 MG/2ML IJ SOLN: 5.0000 mg | INTRAMUSCULAR | Status: DC | PRN

## 2022-04-04 NOTE — ED Notes (Signed)
Lab called for culture and repeat lactic draw. ?

## 2022-04-04 NOTE — ED Notes (Signed)
ED TO INPATIENT HANDOFF REPORT ? ?ED Nurse Name and Phone #:  Delice Bison, RN ? ?S ?Name/Age/Gender ?[name] ?56 y.o. ?male ?Room/Bed: WA07/WA07 ? ?Code Status ?  Code Status: Full Code ? ?Home/SNF/Other ?Home ?Patient oriented to: self, place, time, and situation ?Is this baseline? Yes  ? ?Triage Complete: Triage complete  ?Chief Complaint ?Sepsis due to cellulitis (HCC) [L03.90, A41.9] ? ?Triage Note ?Pt arrived via EMS, c/o  bilateral leg pain.  ? ?Allergies ?Allergies  ?Allergen Reactions  ? Ibuprofen Anaphylaxis  ? Acetaminophen Hives, Nausea And Vomiting and Rash  ?  Pt overdosed on bottle of APAP 05/06/16 ?  ? Lyrica Cr [Pregabalin Er] Other (See Comments)  ?  Dystonia and dysarthria  ? Peanut-Containing Drug Products Other (See Comments)  ?  Pt denies allergy to peanut butter 03/02/2021  ? ? ?Level of Care/Admitting Diagnosis ?ED Disposition   ? ? ED Disposition  ?Admit  ? Condition  ?--  ? Comment  ?Hospital Area: [facility] [[id number]] ? Level of Care: Telemetry [5] ? Admit to tele based on following criteria: Monitor for Ischemic changes ? May place patient in observation at [facility] or [facility] if equivalent level of care is available:: No ? Covid Evaluation: Asymptomatic - no recent exposure (last 10 days) testing not required ? Diagnosis: Sepsis due to cellulitis [facility]) [[id number]] ? Admitting Physician: Bobette Mo [[id number]] ? Attending Physician: Bobette Mo [[id number]] ?  ?  ? ?  ? ? ?B ?Medical/Surgery History ?Past Medical History:  ?Diagnosis Date  ? Asthma   ? Bipolar 1 disorder (HCC)   ? CHF (congestive heart failure) (HCC)   ? COPD (chronic obstructive pulmonary disease) (HCC)   ? Depression   ? Diabetes mellitus without complication (HCC)   ? GERD (gastroesophageal reflux disease)   ? Homelessness   ? Hypertension   ? QT prolongation   ? Schizoaffective disorder (HCC)   ? ?Past Surgical History:  ?Procedure Laterality Date  ? 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  Procedure: AMPUTATION TOE;  Surgeon: Park Liter, DPM;  Location: [facility] OR;  Service: Podiatry;  Laterality: Bilateral;  Ok to leave patient on hospital bed  ? IR ANGIO INTRA EXTRACRAN SEL COM CAROTID INNOMINATE BILAT MOD SED  12/17/2020  ? IR ANGIO VERTEBRAL SEL VERTEBRAL BILAT MOD SED  12/17/2020  ? RADIOLOGY WITH ANESTHESIA N/A 12/17/2020  ? Procedure: IR WITH ANESTHESIA;  Surgeon: Radiologist, Medication, MD;  Location: [facility] OR;  Service: Radiology;  Laterality: N/A;  ?  ? ?A ?IV Location/Drains/Wounds ?Patient Lines/Drains/Airways Status   ? ? Active Line/Drains/Airways   ? ? Name Placement date Placement time Site Days  ? Peripheral IV 04/04/22 20 G 1" Left;Posterior Hand 04/04/22  1040  Hand  less than 1  ? Incision (Closed) 01/02/21 Foot Other (Comment) 01/02/21  1401  -- 457  ? Wound / Incision (Open or Dehisced) 11/14/20 Laceration Toe (Comment  which one) Left left great toe laceration bottom 11/14/20  1647  Toe (Comment  which one)  506  ? Wound / Incision (Open or Dehisced) 11/14/20 Laceration Toe (Comment  which one) Right laceration noted to right great toe bottom of the pad 11/14/20  1744  Toe (Comment  which one)  506  ? Wound / Incision (Open or Dehisced) 12/17/20 Non-pressure wound Toe (Comment  which one) Anterior;Right Great toe 12/17/20  1600  Toe (Comment  which one)  473  ? Wound / Incision (Open or Dehisced) Toe (  Comment  which one) Anterior;Left Great toe --  --  Toe (Comment  which one)  --  ? Wound / Incision (Open or Dehisced) 12/17/20 Puncture Groin Right arterial access puncture site- right femoral  12/17/20  1509  Groin  473  ? Wound / Incision (Open or Dehisced) 02/02/22 Other (Comment) Leg Lower;Right open wound on left lateral lower extremity 02/02/22  0800  Leg  61  ? ?  ?  ? ?  ? ? ?Intake/Output Last 24 hours ? ?Intake/Output Summary (Last 24 hours) at 04/04/2022 1322 ?Last data filed at 04/04/2022 1227 ?Gross per 24 hour  ?Intake 250 ml  ?Output --  ?Net 250 ml   ? ? ?Labs/Imaging ?Results for orders placed or performed during the hospital encounter of 04/03/22 (from the past 48 hour(s))  ?Resp Panel by RT-PCR (Flu A&B, Covid) Nasopharyngeal Swab     Status: None  ? Collection Time: 04/03/22  5:23 PM  ? Specimen: Nasopharyngeal Swab; Nasopharyngeal(NP) swabs in vial transport medium  ?Result Value Ref Range  ? SARS Coronavirus 2 by RT PCR NEGATIVE NEGATIVE  ?  Comment: (NOTE) ?SARS-CoV-2 target nucleic acids are NOT DETECTED. ? ?The SARS-CoV-2 RNA is generally detectable in upper respiratory ?specimens during the acute phase of infection. The lowest ?concentration of SARS-CoV-2 viral copies this assay can detect is ?138 copies/mL. A negative result does not preclude SARS-Cov-2 ?infection and should not be used as the sole basis for treatment or ?other patient management decisions. A negative result may occur with  ?improper specimen collection/handling, submission of specimen other ?than nasopharyngeal swab, presence of viral mutation(s) within the ?areas targeted by this assay, and inadequate number of viral ?copies(<138 copies/mL). A negative result must be combined with ?clinical observations, patient history, and epidemiological ?information. The expected result is Negative. ? ?Fact Sheet for Patients:  ?BloggerCourse.com ? ?Fact Sheet for Healthcare Providers:  ?SeriousBroker.it ? ?This test is no t yet approved or cleared by the [place] FDA and  ?has been authorized for detection and/or diagnosis of SARS-CoV-2 by ?FDA under an Emergency Use Authorization (EUA). This EUA will remain  ?in effect (meaning this test can be used) for the duration of the ?COVID-19 declaration under Section 564(b)(1) of the Act, 21 ?U.S.C.section 360bbb-3(b)(1), unless the authorization is terminated  ?or revoked sooner.  ? ? ?  ? Influenza A by PCR NEGATIVE NEGATIVE  ? Influenza B by PCR NEGATIVE NEGATIVE  ?  Comment: (NOTE) ?The Xpert  Xpress SARS-CoV-2/FLU/RSV plus assay is intended as an aid ?in the diagnosis of influenza from Nasopharyngeal swab specimens and ?should not be used as a sole basis for treatment. Nasal washings and ?aspirates are unacceptable for Xpert Xpress SARS-CoV-2/FLU/RSV ?testing. ? ?Fact Sheet for Patients: ?BloggerCourse.com ? ?Fact Sheet for Healthcare Providers: ?SeriousBroker.it ? ?This test is not yet approved or cleared by the [place] FDA and ?has been authorized for detection and/or diagnosis of SARS-CoV-2 by ?FDA under an Emergency Use Authorization (EUA). This EUA will remain ?in effect (meaning this test can be used) for the duration of the ?COVID-19 declaration under Section 564(b)(1) of the Act, 21 U.S.C. ?section 360bbb-3(b)(1), unless the authorization is terminated or ?revoked. ? ?Performed at [facility], [address] ?[place], [name] [address] ?  ?Comprehensive metabolic panel     Status: Abnormal  ? Collection Time: 04/03/22  5:23 PM  ?Result Value Ref Range  ? Sodium 136 135 - 145 mmol/L  ? Potassium 4.0 3.5 - 5.1 mmol/L  ?  Chloride 99 98 - 111 mmol/L  ? CO2 22 22 - 32 mmol/L  ? Glucose, Bld 101 (H) 70 - 99 mg/dL  ?  Comment: Glucose reference range applies only to samples taken after fasting for at least 8 hours.  ? BUN 23 (H) 6 - 20 mg/dL  ? Creatinine, Ser 0.99 0.61 - 1.24 mg/dL  ? Calcium 9.1 8.9 - 10.3 mg/dL  ? Total Protein 8.5 (H) 6.5 - 8.1 g/dL  ? Albumin 3.7 3.5 - 5.0 g/dL  ? AST 22 15 - 41 U/L  ? ALT 18 0 - 44 U/L  ? Alkaline Phosphatase 91 38 - 126 U/L  ? Total Bilirubin 0.8 0.3 - 1.2 mg/dL  ? GFR, Estimated >60 >60 mL/min  ?  Comment: (NOTE) ?Calculated using the CKD-EPI Creatinine Equation (2021) ?  ? Anion gap 15 5 - 15  ?  Comment: Performed at [facility], [address], [place], [name] [address]  ?Ethanol     Status: None  ? Collection Time: 04/03/22  5:23 PM  ?Result Value Ref Range  ?  Alcohol, Ethyl (B) <10 <10 mg/dL  ?  Comment: (NOTE) ?Lowest detectable limit for serum alcohol is 10 mg/dL. ? ?For medical purposes only. ?Performed at [facility], [address] ?[place], [name] [address]

## 2022-04-04 NOTE — ED Notes (Signed)
Around 2130, Dr. Jeraldine Loots and I removed BLE dressings. He took pictures of anterior wounds. Per Dr. Jeraldine Loots, cleansed BLE wounds with NS, covered with xeroform, ABD pads, kerlix and tape. Administered pain medication for pain 10/10. Will continue to monitor.  ?

## 2022-04-04 NOTE — Progress Notes (Signed)
Patient has been faxed out per the request of Dr. Lucianne Muss. Patient meets BH inpatient criteria per Nira Conn, NP. Patient has been faxed out to the following facilities:  ? ?[facility]  [address] [place] [name] [address] [phone]  ?[facility]  [address], [name] [address] [phone]  ?[facility]  [address] [name] [address] [phone]  ?[facility]  [address] [name] [address] [phone]  ?[facility]  [address] [place], [name] [address] [phone]  ?[facility]  [address] [place], [name] [address] [phone]  ?[facility]  [address] [name] [address] [phone]  ?[facility]  [address] [name] [address] [phone]  ?[facility]  [address], [name] [address] [phone]  ?[facility]  [address] [name] [address] [phone]  ? ?Damita Dunnings, MSW, [name]-A  ?11:21 AM 04/04/2022   ?

## 2022-04-04 NOTE — H&P (Signed)
?History and Physical  ? ? ?Patient: [name] XBW:[id number] DOB: 11/07/1966 ?DOA: 04/03/2022 ?DOS: the patient was seen and examined on 04/04/2022 ?PCP: [name], NP  ?Patient coming from: Homeless ? ?Chief Complaint:  ?Chief Complaint  ?Patient presents with  ? Leg Pain  ? ?HPI: [name] is a 56 y.o. male with medical history significant of asthma, bipolar 1 disorder, depression, schizoaffective disorder, chronic congestive heart failure, homelessness, GERD, hypertension, QT prolongation, type 2 diabetes, alcohol abuse, cocaine and cannabis abuse, tobacco use, chronic combined systolic and diastolic heart failure who is coming to the emergency department with complaints of leg pain and suicidal ideations.  He has been having lower extremity ulcers for months.  This morning he was seen by wound care.  Several hours later, the patient became hypotensive and developed lactic acidosis during code sepsis.  He is an avid smoker 1 to 2 packs of cigarettes a day.  He denied fever, chills, decreased appetite, nausea, emesis or night sweats.  No abdominal, back or chest pain.  Denied dyspnea, palpitations, PND orthopnea.  No polyuria, polydipsia, polyphagia or blurred vision.  The patient became upset when I told him that tobacco use is probably impairing healing of his lower extremities ulcers stating that we "doctors have to blame it on something when we don't know".   ? ?ED course: Initial vital signs were temperature 98 ?F, pulse 102, respiration 20, BP 130/102 mmHg and O2 sat 100% on room air.  The patient initially was supposed to be admitted to behavioral health with wound care.  He was seen by psychiatry yesterday evening.  He was seen by wound care this morning.  However, while in the emergency department the patient became hypotensive as low as 76/62 mmHg and developed lactic acidosis.  He received NS 750 mL bolus, vancomycin and ceftriaxone.  Yesterday evening he had hydroxyzine 10 mg p.o.  x1. ? ?Lab work: His CBC is her white count 15.6, hemoglobin 15.3 g/dL platelets 163.  Coronavirus PCR was negative.  UDS was positive for cocaine and THC.  CMP showed a glucose of 101, BUN 23 mg/dL and total protein 8.5 g/dL.  The rest of the CMP was normal.  Repeat CBC showed a white count of 10.6, hemoglobin 12.5 g/dL and platelets 845.  Lactic acid is 2.7 and then 1.6 mmol/L. ? ?Review of Systems: As mentioned in the history of present illness. All other systems reviewed and are negative. ?Past Medical History:  ?Diagnosis Date  ? Asthma   ? Bipolar 1 disorder (HCC)   ? CHF (congestive heart failure) (HCC)   ? COPD (chronic obstructive pulmonary disease) (HCC)   ? Depression   ? Diabetes mellitus without complication (HCC)   ? GERD (gastroesophageal reflux disease)   ? Homelessness   ? Hypertension   ? QT prolongation   ? Schizoaffective disorder (HCC)   ? ?Past Surgical History:  ?Procedure Laterality Date  ? 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 Procedure: AMPUTATION TOE;  Surgeon: Park Liter, DPM;  Location: [facility] OR;  Service: Podiatry;  Laterality: Bilateral;  Ok to leave patient on hospital bed  ? IR ANGIO INTRA EXTRACRAN SEL COM CAROTID INNOMINATE BILAT MOD SED  12/17/2020  ? IR ANGIO VERTEBRAL SEL VERTEBRAL BILAT MOD SED  12/17/2020  ? RADIOLOGY WITH ANESTHESIA N/A 12/17/2020  ? Procedure: IR WITH ANESTHESIA;  Surgeon: Radiologist, Medication, MD;  Location: [facility] OR;  Service: Radiology;  Laterality: N/A;  ? ?Social History:  reports that he has  been smoking cigarettes. He has never used smokeless tobacco. He reports current alcohol use. He reports current drug use. Drugs: Cocaine, Marijuana, and "Crack" cocaine. ? ?Allergies  ?Allergen Reactions  ? Ibuprofen Anaphylaxis  ? Acetaminophen Hives, Nausea And Vomiting and Rash  ?  Pt overdosed on bottle of APAP 05/06/16 ?  ? Lyrica Cr [Pregabalin Er] Other (See Comments)  ?  Dystonia and dysarthria  ? Peanut-Containing Drug Products Other (See Comments)  ?  Pt  denies allergy to peanut butter 03/02/2021  ? ? ?Family History  ?Problem Relation Age of Onset  ? Drug abuse Mother   ? Suicidality Mother   ? ? ?Prior to Admission medications   ?Medication Sig Start Date End Date Taking? Authorizing Provider  ?albuterol (VENTOLIN HFA) 108 (90 Base) MCG/ACT inhaler Inhale 2 puffs into the lungs every 6 (six) hours as needed for wheezing or shortness of breath. 03/07/22  Yes Meredeth Ide, MD  ?apixaban (ELIQUIS) 5 MG TABS tablet Take 2 tablets by mouth 2 times daily x 7 days, then start taking 1 tablet by mouth  twice daily from 04/07/22 04/06/22  Yes Sharl Ma, Sarina Ill, MD  ?carvedilol (COREG) 3.125 MG tablet Take 1 tablet (3.125 mg total) by mouth 2 (two) times daily with a meal. 03/07/22  Yes Sharl Ma, Sarina Ill, MD  ?folic acid (FOLVITE) 1 MG tablet Take 1 tablet (1 mg total) by mouth daily. 03/08/22  Yes Meredeth Ide, MD  ?furosemide (LASIX) 40 MG tablet Take 1 tablet (40 mg total) by mouth 2 (two) times daily. 03/07/22 03/07/23 Yes Meredeth Ide, MD  ?gabapentin (NEURONTIN) 400 MG capsule Take 1 capsule (400 mg total) by mouth 3 (three) times daily. 03/07/22  Yes Meredeth Ide, MD  ?hydrALAZINE (APRESOLINE) 25 MG tablet Take 1 tablet (25 mg total) by mouth 3 (three) times daily. 03/07/22 07/05/22 Yes Meredeth Ide, MD  ?isosorbide mononitrate (IMDUR) 60 MG 24 hr tablet Take 1 tablet (60 mg total) by mouth daily. 03/07/22 07/05/22 Yes Meredeth Ide, MD  ?levETIRAcetam (KEPPRA) 500 MG tablet Take 1 tablet (500 mg total) by mouth 2 (two) times daily. 03/07/22 04/06/22 Yes Meredeth Ide, MD  ?OLANZapine (ZYPREXA) 5 MG tablet Take 1 tablet (5 mg total) by mouth daily. 03/07/22  Yes Meredeth Ide, MD  ?oxyCODONE (OXY IR/ROXICODONE) 5 MG immediate release tablet Take 1-2 tablets (5-10 mg total) by mouth every 6 (six) hours as needed for severe pain or breakthrough pain. 03/31/22  Yes Meredeth Ide, MD  ?spironolactone (ALDACTONE) 25 MG tablet Take 1/2 tablet (12.5 mg total) by mouth daily. 03/08/22  Yes Meredeth Ide, MD   ?tiotropium (SPIRIVA HANDIHALER) 18 MCG inhalation capsule Place 1 capsule (18 mcg total) into inhaler and inhale daily as directed. 03/07/22 06/05/22 Yes Meredeth Ide, MD  ?valsartan (DIOVAN) 40 MG tablet Take 1/2 tablet (20 mg total) by mouth 2 (two) times daily. 03/07/22 03/07/23 Yes Meredeth Ide, MD  ?atorvastatin (LIPITOR) 10 MG tablet Take 4 tablets (40 mg total) by mouth daily. ?Patient not taking: Reported on 04/04/2022 03/07/22   Meredeth Ide, MD  ?thiamine 100 MG tablet Take 1 tablet (100 mg total) by mouth daily. ?Patient not taking: Reported on 04/04/2022 03/08/22   Meredeth Ide, MD  ? ? ?Physical Exam: ?Vitals:  ? 04/04/22 1145 04/04/22 1200 04/04/22 1215 04/04/22 1230  ?BP: 94/67 (!) 91/54 107/76 108/77  ?Pulse: 92 90 84 89  ?Resp: (!) 24 17 (!) 22 (!)  23  ?Temp:      ?TempSrc:      ?SpO2: 97% 99% 98% 99%  ? ?Physical Exam ?Vitals and nursing note reviewed.  ?Constitutional:   ?   Appearance: Normal appearance.  ?HENT:  ?   Head: Normocephalic.  ?   Mouth/Throat:  ?   Mouth: Mucous membranes are dry.  ?Eyes:  ?   General: No scleral icterus. ?   Pupils: Pupils are equal, round, and reactive to light.  ?Neck:  ?   Vascular: No JVD.  ?Cardiovascular:  ?   Rate and Rhythm: Normal rate and regular rhythm.  ?   Heart sounds: S1 normal and S2 normal.  ?Pulmonary:  ?   Breath sounds: No stridor. Wheezing present. No rhonchi or rales.  ?Abdominal:  ?   General: Bowel sounds are normal. There is no distension.  ?   Palpations: Abdomen is soft.  ?   Tenderness: There is no abdominal tenderness. There is no right CVA tenderness or guarding.  ?Musculoskeletal:  ?   Cervical back: Neck supple.  ?   Right lower leg: No edema.  ?   Left lower leg: No edema.  ?   Comments: Amputated big toes bilaterally.  ?Skin: ?   General: Skin is warm.  ?   Findings: Erythema, lesion and wound present.  ?   Comments: See pictures below.  ?Neurological:  ?   General: No focal deficit present.  ?   Mental Status: He is alert and oriented to  person, place, and time.  ?Psychiatric:  ?   Comments: The patient was mildly upset when he was told to cease smoking to improve his lower extremity wounds.  ? ? ?RLE. ? ? ?LLE. ? ?Echocardiogram complete 01/30/2022. ?

## 2022-04-04 NOTE — Progress Notes (Signed)
Pharmacy Antibiotic Note ? ?[name] is a 56 y.o. male admitted on 04/03/2022 with sepsis d/t suspected infection of chronic LE wounds.  Pharmacy has been consulted for vancomycin dosing. ? ?Plan: ?Vancomycin 1 mg IV q12 hr (est AUC 444 based on SCr 0.97; Vd 0.72) ?Measure vancomycin AUC at steady state as indicated ?SCr q48 while on vanc ?Rocephin per MD; dosing appropriate ?  ? ?Temp (24hrs), Avg:98.2 ?F (36.8 ?C), Min:97.5 ?F (36.4 ?C), Max:99.1 ?F (37.3 ?C) ? ?Recent Labs  ?Lab 04/03/22 ?1723 04/04/22 ?1016  ?WBC 15.6* 10.6*  ?CREATININE 0.99 0.97  ?LATICACIDVEN  --  2.7*  ?  ?Estimated Creatinine Clearance: 92.6 mL/min (by C-G formula based on SCr of 0.97 mg/dL).   ? ?Allergies  ?Allergen Reactions  ? Ibuprofen Anaphylaxis  ? Acetaminophen Hives, Nausea And Vomiting and Rash  ?  Pt overdosed on bottle of APAP 05/06/16 ?  ? Lyrica Cr [Pregabalin Er] Other (See Comments)  ?  Dystonia and dysarthria  ? Peanut-Containing Drug Products Other (See Comments)  ?  Pt denies allergy to peanut butter 03/02/2021  ? ? ?Antimicrobials this admission: ?5/5 vancomycin >>  ?5/5 Rocephin >>  ? ?Dose adjustments this admission: n/a ? ?Microbiology results: ?5/5 BCx: sent ? ? ?Thank you for allowing pharmacy to be a part of this patient?s care. ? ?Jaleyah Longhi A ?04/04/2022 12:27 PM ? ?

## 2022-04-04 NOTE — Progress Notes (Signed)
Sitter attempted to take a blood pressure prior to administration of scheduled hydralazine. BP was 81/39 (MAP 45). Bedside nurse explained to patient that she would have to get him to lay flat and retake, pt became agitated and stated "I'm not going to let you touch me again". Bedside nurse paged provider on call and will attempt to retake in 30 mins. Patient is mentating appropriately and no distress noted. Hydralazine is being held. Provider notified.  ?

## 2022-04-04 NOTE — Consult Note (Signed)
WOC Nurse Consult Note: ?Patient receiving care in [facility] ED 27. Consult completed remotely after review of record. ?Reason for Consult: BLE wounds ?Wound type: chronic, non-healing wounds in a homeless patient without adherence to treatment plan. Has been seen in the [facility], where conservative treatment was initiated. Patient has declined compression wraps previously. ?Pressure Injury POA: Yes/No/NA ?Measurement: ?Wound bed: see photos ?Drainage (amount, consistency, odor)  ?Periwound: ?Dressing procedure/placement/frequency: ? ?Wash BLEs with soap and water, pat dry. Apply Sween Moisturizer (pink and white tube in clean utility) to intact skin. Place folded Xeroform gauzes over all wound areas, top with ABD pads. Beginning behind toes, spiral wrap kerlix, then Ace bandages to just below knees. Change daily. ? ?[facility] nurse will not follow at this time.  Please re-consult the [facility] team if needed. ? ?Val Riles, RN, MSN, CWOCN, CNS-BC, pager [phone]  ?

## 2022-04-04 NOTE — ED Notes (Signed)
Patient calm and cooperative most of shift. He gets irritable at times and tries to be as independent as possible.  ?

## 2022-04-04 NOTE — ED Provider Notes (Signed)
Emergency Medicine Observation Re-evaluation Note ? ?Anurag Scarfo is a 56 y.o. male, seen on rounds today.  Pt initially presented to the ED for complaints of Leg Pain ?Currently, the patient is awaiting inpatient placement. ? ?Physical Exam  ?BP 99/65 (BP Location: Left Arm)   Pulse 88   Temp 98.2 ?F (36.8 ?C) (Oral)   Resp 16   SpO2 99%  ?Physical Exam ?General: Awake and alert.  ?Cardiac: Well perfused.  ?Lungs: Even, unlabored respirations.  ?Psych: Calm and cooperative.  ? ?ED Course / MDM  ?EKG:EKG Interpretation ? ?Date/Time:  Thursday Apr 03 2022 15:35:51 EDT ?Ventricular Rate:  105 ?PR Interval:  172 ?QRS Duration: 86 ?QT Interval:  352 ?QTC Calculation: 465 ?R Axis:   -6 ?Text Interpretation: Sinus tachycardia Moderate voltage criteria for LVH, may be normal variant ( Sokolow-Lyon , Cornell product ) ST-t wave abnormality Abnormal ECG Confirmed by Gerhard Munch [id number]) on 04/03/2022 6:53:08 PM ? ?I have reviewed the labs performed to date as well as medications administered while in observation.  Recent changes in the last 24 hours include patient's BP this AM is soft. MAPs in ok range. Patient noted to have systolic CHF and wounds to the legs. Empiric Doxycycline started yesterday. Plan for very gentle IVF bolus here, hold BP meds, and reassess. ? ?11:41 AM  ?Patient's lactic acid coming back at 2.7.  Pressure remains soft.  Will give IV fluids and broadening to the antibiotics and cultures.  Ultimately, patient will likely require admit with concern for developing sepsis.  ? ?Discussed patient's case with [facility], Dr. Robb Matar to request admission. Patient and family (if present) updated with plan. ? ? ? ?CRITICAL CARE ?Performed by: Maia Plan ?Total critical care time: 35 minutes ?Critical care time was exclusive of separately billable procedures and treating other patients. ?Critical care was necessary to treat or prevent imminent or life-threatening deterioration. ?Critical care was time spent  personally by me on the following activities: development of treatment plan with patient and/or surrogate as well as nursing, discussions with consultants, evaluation of patient's response to treatment, examination of patient, obtaining history from patient or surrogate, ordering and performing treatments and interventions, ordering and review of laboratory studies, ordering and review of radiographic studies, pulse oximetry and re-evaluation of patient's condition. ? ?Alona Bene, MD ?Emergency Medicine ? ? ?Plan  ?Current plan is for inpatient psychiatry admit. ? Zarif Rathje is not under involuntary commitment. ? ? ?  ?Maia Plan, MD ?04/05/22 [phone] ? ?

## 2022-04-04 NOTE — ED Notes (Signed)
Pt belongings in locker #7.

## 2022-04-05 DIAGNOSIS — L03115 Cellulitis of right lower limb: Secondary | ICD-10-CM | POA: Diagnosis not present

## 2022-04-05 DIAGNOSIS — L0390 Cellulitis, unspecified: Secondary | ICD-10-CM | POA: Diagnosis not present

## 2022-04-05 DIAGNOSIS — G894 Chronic pain syndrome: Secondary | ICD-10-CM | POA: Diagnosis not present

## 2022-04-05 DIAGNOSIS — Z5900 Homelessness unspecified: Secondary | ICD-10-CM

## 2022-04-05 DIAGNOSIS — I82621 Acute embolism and thrombosis of deep veins of right upper extremity: Secondary | ICD-10-CM | POA: Diagnosis not present

## 2022-04-05 DIAGNOSIS — A419 Sepsis, unspecified organism: Principal | ICD-10-CM

## 2022-04-05 DIAGNOSIS — I5040 Unspecified combined systolic (congestive) and diastolic (congestive) heart failure: Secondary | ICD-10-CM

## 2022-04-05 LAB — GLUCOSE, CAPILLARY
Glucose-Capillary: 107 mg/dL — ABNORMAL HIGH (ref 70–99)
Glucose-Capillary: 102 mg/dL — ABNORMAL HIGH (ref 70–99)
Glucose-Capillary: 118 mg/dL — ABNORMAL HIGH (ref 70–99)
Glucose-Capillary: 281 mg/dL — ABNORMAL HIGH (ref 70–99)

## 2022-04-05 LAB — CBC WITH DIFFERENTIAL/PLATELET
Abs Immature Granulocytes: 0.03 10*3/uL (ref 0.00–0.07)
Basophils Absolute: 0.1 10*3/uL (ref 0.0–0.1)
Basophils Relative: 1 %
Eosinophils Absolute: 0.1 10*3/uL (ref 0.0–0.5)
Eosinophils Relative: 2 %
HCT: 35.4 % — ABNORMAL LOW (ref 39.0–52.0)
Hemoglobin: 11.6 g/dL — ABNORMAL LOW (ref 13.0–17.0)
Immature Granulocytes: 0 %
Lymphocytes Relative: 30 %
Lymphs Abs: 2.2 10*3/uL (ref 0.7–4.0)
MCH: 30.7 pg (ref 26.0–34.0)
MCHC: 32.8 g/dL (ref 30.0–36.0)
MCV: 93.7 fL (ref 80.0–100.0)
Monocytes Absolute: 0.9 10*3/uL (ref 0.1–1.0)
Monocytes Relative: 12 %
Neutro Abs: 4.2 10*3/uL (ref 1.7–7.7)
Neutrophils Relative %: 55 %
Platelets: 311 10*3/uL (ref 150–400)
RBC: 3.78 MIL/uL — ABNORMAL LOW (ref 4.22–5.81)
RDW: 14.4 % (ref 11.5–15.5)
WBC: 7.5 10*3/uL (ref 4.0–10.5)
nRBC: 0 % (ref 0.0–0.2)

## 2022-04-05 LAB — COMPREHENSIVE METABOLIC PANEL
ALT: 12 U/L (ref 0–44)
AST: 13 U/L — ABNORMAL LOW (ref 15–41)
Albumin: 2.5 g/dL — ABNORMAL LOW (ref 3.5–5.0)
Alkaline Phosphatase: 56 U/L (ref 38–126)
Anion gap: 11 (ref 5–15)
BUN: 14 mg/dL (ref 6–20)
CO2: 22 mmol/L (ref 22–32)
Calcium: 8.2 mg/dL — ABNORMAL LOW (ref 8.9–10.3)
Chloride: 105 mmol/L (ref 98–111)
Creatinine, Ser: 1.06 mg/dL (ref 0.61–1.24)
GFR, Estimated: >60 mL/min (ref >60)
Glucose, Bld: 100 mg/dL — ABNORMAL HIGH (ref 70–99)
Potassium: 3.6 mmol/L (ref 3.5–5.1)
Sodium: 138 mmol/L (ref 135–145)
Total Bilirubin: 0.9 mg/dL (ref 0.3–1.2)
Total Protein: 6.4 g/dL — ABNORMAL LOW (ref 6.5–8.1)

## 2022-04-05 LAB — PROCALCITONIN: Procalcitonin: <0.1 ng/mL

## 2022-04-05 MED ORDER — ENSURE ENLIVE PO LIQD
237.0000 mL | Freq: Two times a day (BID) | ORAL | Status: DC
  Administered 2022-04-06 – 2022-04-07 (×3): 237 mL via ORAL

## 2022-04-05 MED ORDER — JUVEN PO PACK
1.0000 | PACK | Freq: Two times a day (BID) | ORAL | Status: DC
  Administered 2022-04-05 – 2022-04-07 (×4): 1 via ORAL
  Filled 2022-04-05 (×7): qty 1

## 2022-04-05 MED ORDER — ARIPIPRAZOLE 10 MG PO TABS
5.0000 mg | ORAL_TABLET | Freq: Every day | ORAL | Status: DC
  Administered 2022-04-05 – 2022-04-11 (×7): 5 mg via ORAL
  Filled 2022-04-05 (×7): qty 1

## 2022-04-05 MED ORDER — FENTANYL CITRATE PF 50 MCG/ML IJ SOSY
25.0000 ug | PREFILLED_SYRINGE | INTRAMUSCULAR | Status: DC | PRN
Start: 2022-04-05 — End: 2022-04-10
  Administered 2022-04-05 – 2022-04-09 (×3): 25 ug via INTRAVENOUS
  Filled 2022-04-05 (×4): qty 1

## 2022-04-05 MED ORDER — APIXABAN 5 MG PO TABS: 5.0000 mg | ORAL_TABLET | Freq: Two times a day (BID) | ORAL | Status: DC

## 2022-04-05 MED ORDER — APIXABAN 5 MG PO TABS
10.0000 mg | ORAL_TABLET | Freq: Two times a day (BID) | ORAL | Status: DC
  Administered 2022-04-05 – 2022-04-11 (×12): 10 mg via ORAL
  Filled 2022-04-05 (×12): qty 2

## 2022-04-05 MED ORDER — ADULT MULTIVITAMIN W/MINERALS CH
1.0000 | ORAL_TABLET | Freq: Every day | ORAL | Status: DC
  Administered 2022-04-05 – 2022-04-11 (×7): 1 via ORAL
  Filled 2022-04-05 (×7): qty 1

## 2022-04-05 MED ORDER — APIXABAN 5 MG PO TABS
5.0000 mg | ORAL_TABLET | Freq: Once | ORAL | Status: AC
  Administered 2022-04-05: 5 mg via ORAL
  Filled 2022-04-05: qty 1

## 2022-04-05 MED ORDER — OXYCODONE HCL 5 MG PO TABS
10.0000 mg | ORAL_TABLET | ORAL | Status: DC | PRN
  Administered 2022-04-05 – 2022-04-11 (×28): 10 mg via ORAL
  Filled 2022-04-05 (×29): qty 2

## 2022-04-05 NOTE — Discharge Instructions (Signed)
Information on my medicine - ELIQUIS? (apixaban) ? ? ?Why was Eliquis? prescribed for you? ?Eliquis? was prescribed to treat blood clots that may have been found in the veins of your legs (deep vein thrombosis) or in your lungs (pulmonary embolism) and to reduce the risk of them occurring again. ? ?What do You need to know about Eliquis? ? ?The starting dose is 10 mg (two 5 mg tablets) taken TWICE daily for the FIRST SEVEN (7) DAYS, then on 04/12/2022  the dose is reduced to ONE 5 mg tablet taken TWICE daily.  Eliquis? may be taken with or without food.  ? ?Try to take the dose about the same time in the morning and in the evening. If you have difficulty swallowing the tablet whole please discuss with your pharmacist how to take the medication safely. ? ?Take Eliquis? exactly as prescribed and DO NOT stop taking Eliquis? without talking to the doctor who prescribed the medication.  Stopping may increase your risk of developing a new blood clot.  Refill your prescription before you run out. ? ?After discharge, you should have regular check-up appointments with your healthcare provider that is prescribing your Eliquis?. ?   ?What do you do if you miss a dose? ?If a dose of ELIQUIS? is not taken at the scheduled time, take it as soon as possible on the same day and twice-daily administration should be resumed. The dose should not be doubled to make up for a missed dose. ? ?Important Safety Information ?A possible side effect of Eliquis? is bleeding. You should call your healthcare provider right away if you experience any of the following: ?Bleeding from an injury or your nose that does not stop. ?Unusual colored urine (red or dark brown) or unusual colored stools (red or black). ?Unusual bruising for unknown reasons. ?A serious fall or if you hit your head (even if there is no bleeding). ? ?Some medicines may interact with Eliquis? and might increase your risk of bleeding or clotting while on Eliquis?. To help avoid  this, consult your healthcare provider or pharmacist prior to using any new prescription or non-prescription medications, including herbals, vitamins, non-steroidal anti-inflammatory drugs (NSAIDs) and supplements. ? ?This website has more information on Eliquis? (apixaban): http://www.eliquis.com/eliquis/home  ?

## 2022-04-05 NOTE — Progress Notes (Signed)
Initial Nutrition Assessment ?RD working remotely. ? ? ?DOCUMENTATION CODES:  ? ?Not applicable ? ?INTERVENTION:  ?- will order Ensure Plus High Protein BID, each supplement provides 350 kcal and 20 grams of protein. ? ?- will order 1 tablet multivitamin with minerals/day. ? ?- will order 1 packet Juven BID, each packet provides 95 calories, 2.5 grams of protein (collagen), and 9.8 grams of carbohydrate (3 grams sugar); also contains 7 grams of L-arginine and L-glutamine, 300 mg vitamin C, 15 mg vitamin E, 1.2 mcg vitamin B-12, 9.5 mg zinc, 200 mg calcium, and 1.5 g  Calcium Beta-hydroxy-Beta-methylbutyrate to support wound healing ? ?- complete NFPE when feasible.  ? ? ?NUTRITION DIAGNOSIS:  ? ?Increased nutrient needs related to acute illness, wound healing as evidenced by estimated needs. ? ?GOAL:  ? ?Patient will meet greater than or equal to 90% of their needs ? ?MONITOR:  ? ?PO intake, Supplement acceptance, Labs, Weight trends, Skin ? ?REASON FOR ASSESSMENT:  ? ?Malnutrition Screening Tool ? ?ASSESSMENT:  ? ?56 year old male with medical history of asthma, bipolar, depression, schizoaffective disorder, CHF, homelessness, GERD, HTN, acute depigmentation, type 2 DM, alcohol use, cocaine and cannabis use, and tobacco use. He presented to the ED due to leg pain and suicidal ideation. In the ED he became hypotensive. He was admitted for possible sepsis from lower extremity wounds. ? ?He ate 100% at breakfast and lunch today. ? ?He was last seen by a [place] RD on 11/15/21 at which time he was agreeable to oral nutrition supplements. RD note from that date indicates that patient reported he was using a wheelchair.  ? ?Weight on 5/1 was 183 lb and weight on 3/8 was 191 lb. This indicates 8 lb weight loss (4.2% body weight) in the past 2 months. ? ? ?Labs reviewed; CBGs: 107 and 102 mg/dl, Ca: 8.2 mg/dl. ? ?Medications reviewed; 1 mg folvite/day, 40 mg oral lasix BID, sliding scale novolog, 100 mg oral  thiamine/day. ?  ? ?NUTRITION - FOCUSED PHYSICAL EXAM: ? ?RD working remotely. ? ?Diet Order:   ?Diet Order   ? ?       ?  Diet regular Room service appropriate? Yes; Fluid consistency: Thin  Diet effective now       ?  ? ?  ?  ? ?  ? ? ?EDUCATION NEEDS:  ? ?Not appropriate for education at this time ? ?Skin:  Skin Assessment: Skin Integrity Issues: ?Skin Integrity Issues:: Other (Comment) ?Other: BLE wounds--limited documentation; monitor for further WOC notes ? ?Last BM:  PTA.unknown ? ?Height:  ? ?Ht Readings from Last 1 Encounters:  ?03/23/22 5\' 8"  (1.727 m)  ? ? ?Weight:  ? ?Wt Readings from Last 1 Encounters:  ?03/31/22 83.1 kg  ? ? ? ?Estimated Nutritional Needs:  ?Kcal:  2100-2300 kcal ?Protein:  105-115 grams ?Fluid:  >/= 2.1 L/day ? ? ? ? ?05/31/22, MS, RD, LDN ?Registered Dietitian II ?Inpatient Clinical Nutrition ?RD pager # and on-call/weekend pager # available in AMION  ? ?

## 2022-04-05 NOTE — Consult Note (Signed)
[facility] Psychiatry New Face-to-Face Psychiatric Evaluation ? ? ?Service Date: Apr 05, 2022 ?LOS:  LOS: 1 day  ? ? ?Assessment  ?[name] is a 56 y.o. male admitted medically for 04/03/2022  1:50 PM for sepsis physiology 2/2 chronic unhealing leg wounds. He carries the psychiatric diagnoses of schizoaffective disorder and multiple substance use disorders and has a past medical history of   asthma, bipolar, depression, schizoaffective disorder, chronic systolic and diastolic CHF, homelessness, GERD, hypertension, acute depigmentation, diabetes mellitus type 2, alcohol use, cocaine and cannabis use, tobacco use. Psychiatry was consulted for suicidal ideations by Ripudeep Rai.  ? ?This patient is well known to the psychiatric consult service. He typically presents with suicidal ideations secondary to unmanaged pain and/or unmet psychosocial needs which resolves when one or both of the above conditions is managed. Today's presentation is different than prior presentations - he had significantly more disorganized thought process, circumstantial (at times almost tangential) associations, and was overtly paranoid through much of assessment. He made several suicidal threats in front of this author. It is unlikely that pt will benefit from inpt psychiatric hospitalization due to long history of non-compliance with most medications and concern for secondary gain, however will continue sitter at present moment while inpt given acuity of threats as leg wounds are treated. Will uphold IVC and revisit decision daily.  ? ?Diagnoses:  ?Active Hospital problems: ?Principal Problem: ?  Sepsis due to cellulitis [facility]) ?Active Problems: ?  Schizoaffective disorder, bipolar type (HCC) ?  Homeless ?  Suicidal ideation ?  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  Homelessness ?  Type 2 diabetes mellitus with diabetic polyneuropathy, without long-term current use of insulin (HCC) ?  Acute  deep vein thrombosis (DVT) of brachial vein (HCC) ?  Normocytic anemia ?  ? ? ?Plan  ?## Safety and Observation Level:  ?- Based on my clinical evaluation, I estimate the patient to be at high risk of self harm in the current setting ?- At this time, we recommend a 1:1 level of observation. This decision is based on my review of the chart including patient's history and current presentation, interview of the patient, mental status examination, and consideration of suicide risk including evaluating suicidal ideation, plan, intent, suicidal or self-harm behaviors, risk factors, and protective factors. This judgment is based on our ability to directly address suicide risk, implement suicide prevention strategies and develop a safety plan while the patient is in the clinical setting. Please contact our team if there is a concern that risk level has changed. ? ? ?## Medications:  ?-- STOP olanzapine (highest incidence of metabolic s/e, no LAI option) ?-- START aripiprazole (pt with historic benefit, has LAI) ? ?## Medical Decision Making Capacity:  ?Not formally assessed ? ?## Further Work-up:  ?-- please monitor and keep K+>4 and Mg+>2 ? ? ? ?-- most recent EKG on 5/4 had QtC of 465 ?-- Pertinent labwork reviewed earlier this admission includes: EtOH <10, UDS + cocaine and THC, CBC max 15.6 now downtrending on abx, CMP with hypoalbuminemia  ? ?## Disposition:  ?-- will reassess daily, currently not medically stable ? ?Thank you for this consult request. Recommendations have been communicated to the primary team.  We will continue to follow at this time.  ? ?Claris Che A Jerric Oyen ? ? ?History   ?Relevant Aspects of Hospital Course:  ?Admitted on 04/03/2022 for sepsis d/t lower extremity leg wounds. ? ?Patient Report:  ?Patient seen in late afternoon.  Overall appears decompensated from last evaluation by this author.  Stated "what bad news you have for me today?"  Before I had the chance to introduce myself and remained  fairly hostile and agitated throughout entirety of interview.  Perseverated on worsening medical issues and perceived miscommunication between different members of his medical team.  At times appeared to recant his prior suicidal ideations "sometimes I get frustrated and I say things I do not mean" however when attempted to reflect this back to the patient interview derailed. Stated "you can say what they want you to say ... I don't know how many times I have had the cops called and been taken off rooftops in [facility]. I know about suicide. My mom killed herself and I get a check every MONTH because of suicide". States "You can play your game but soon you will be playing MY game" (implies he will kill himself here). Makes numerous references to what "they" (presumably medical establishment) want this author to say. Attempted to redirect to current evaluation many times with minimal success. He did deny AH/VH. Did not answer questions about sleep or appetite instead referring this author to what "they" want.  ? ? ?ROS:  ?Paranoia, agitation, tangential thoughts ? ?Collateral information:  ?Made suicidal statements in ED per documentation  ? ?Psychiatric History:  ?Information collected from pt, chart ? ?Family psych history: mother with completed suicide ? ? ?Social History:  ?Homeless ? ?Tobacco use: yes ?Alcohol use: not recent ?Drug use: yes ? ?Family History:  ?The patient's family history includes Drug abuse in his mother; Suicidality in his mother. ? ?Medical History: ?Past Medical History:  ?Diagnosis Date  ? Asthma   ? Bipolar 1 disorder (HCC)   ? CHF (congestive heart failure) (HCC)   ? COPD (chronic obstructive pulmonary disease) (HCC)   ? Depression   ? Diabetes mellitus without complication (HCC)   ? GERD (gastroesophageal reflux disease)   ? Homelessness   ? Hypertension   ? QT prolongation   ? Schizoaffective disorder (HCC)   ? ? ?Surgical History: ?Past Surgical History:  ?Procedure Laterality  Date  ? 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 Procedure: AMPUTATION TOE;  Surgeon: Park Liter, DPM;  Location: [facility] OR;  Service: Podiatry;  Laterality: Bilateral;  Ok to leave patient on hospital bed  ? IR ANGIO INTRA EXTRACRAN SEL COM CAROTID INNOMINATE BILAT MOD SED  12/17/2020  ? IR ANGIO VERTEBRAL SEL VERTEBRAL BILAT MOD SED  12/17/2020  ? RADIOLOGY WITH ANESTHESIA N/A 12/17/2020  ? Procedure: IR WITH ANESTHESIA;  Surgeon: Radiologist, Medication, MD;  Location: [facility] OR;  Service: Radiology;  Laterality: N/A;  ? ? ?Medications:  ? ?Current Facility-Administered Medications:  ?  albuterol (PROVENTIL) (2.5 MG/3ML) 0.083% nebulizer solution 2.5 mg, 2.5 mg, Nebulization, Q6H PRN, Bobette Mo, MD, 2.5 mg at 04/05/22 1537 ?  apixaban (ELIQUIS) tablet 10 mg, 10 mg, Oral, BID **FOLLOWED BY** [START ON 04/12/2022] apixaban (ELIQUIS) tablet 5 mg, 5 mg, Oral, BID, Rai, Ripudeep K, MD ?  atorvastatin (LIPITOR) tablet 40 mg, 40 mg, Oral, Daily, Bobette Mo, MD, 40 mg at 04/05/22 0815 ?  carvedilol (COREG) tablet 3.125 mg, 3.125 mg, Oral, BID WC, Bobette Mo, MD, 3.125 mg at 04/05/22 0815 ?  cefTRIAXone (ROCEPHIN) 1 g in sodium chloride 0.9 % 100 mL IVPB, 1 g, Intravenous, Q24H, Bobette Mo, MD, Stopped at 04/05/22 1357 ?  feeding supplement (ENSURE ENLIVE / ENSURE PLUS) liquid 237 mL, 237 mL, Oral,  BID BM, Rai, Ripudeep K, MD ?  fentaNYL (SUBLIMAZE) injection 25 mcg, 25 mcg, Intravenous, Q4H PRN, Rai, Ripudeep K, MD ?  folic acid (FOLVITE) tablet 1 mg, 1 mg, Oral, Daily, Bobette Mo, MD, 1 mg at 04/05/22 0815 ?  furosemide (LASIX) tablet 40 mg, 40 mg, Oral, BID, Bobette Mo, MD, 40 mg at 04/05/22 0815 ?  gabapentin (NEURONTIN) capsule 400 mg, 400 mg, Oral, TID, Bobette Mo, MD, 400 mg at 04/05/22 1535 ?  insulin aspart (novoLOG) injection 0-15 Units, 0-15 Units, Subcutaneous, TID WC, Bobette Mo, MD, 2 Units at 04/04/22 1845 ?  levETIRAcetam (KEPPRA) tablet 500  mg, 500 mg, Oral, BID, Bobette Mo, MD, 500 mg at 04/05/22 1610 ?  multivitamin with minerals tablet 1 tablet, 1 tablet, Oral, Daily, Rai, Ripudeep K, MD, 1 tablet at 04/05/22 1535 ?  nutrition supplem

## 2022-04-05 NOTE — Progress Notes (Signed)
?      ? ? ? [facility] ?                                                                           ? ? ?[name], is a 56 y.o. male, DOB - [birth year], OAC:[id number] ?Admit date - 04/03/2022    ?Outpatient Primary MD for the patient is [name], [name], NP ? ?LOS - 1  days ? ?Chief Complaint  ?Patient presents with  ? Leg Pain  ?    ? ?Brief summary  ? ?Patient is a 56 year old male with history of asthma, bipolar, depression, schizoaffective disorder, chronic systolic and diastolic CHF, homelessness, GERD, hypertension, acute depigmentation, diabetes mellitus type 2, alcohol use, cocaine and cannabis use, tobacco use, presented to ED with complaints of leg pain and suicidal ideation.  Patient then became hypotensive and developed lactic acidosis.  He smokes 1 to 2 packs of cigarettes daily.  Denied any fever chills, decreased appetite, nausea, vomiting or any diarrhea.  No abdominal pain, chest pain. ? ?In ED, temp 98 ?F, pulse 102, respirations 20, BP initially 130/102, O2 sats 100% on room air.  Initially patient was supposed to be admitted to behavioral care, was seen by psychiatry and wound care.  However in ED, he became hypotensive with BP 76/62 and developed lactic acidosis.   ?He was admitted due to possible sepsis from the lower extremity wounds ? ?Assessment & Plan  ? ? ?Principal Problem: ?  Sepsis due to cellulitis [facility]), lower extremity wounds ?-Patient met sepsis criteria due to hypotension, leukocytosis, lactic acidosis, source likely due to lower extremity cellulitis and wounds ?-Procalcitonin less than 0.1, patient was placed on IV vancomycin and Rocephin ?-MRI of the left ankle on 4/25 had shown no osteomyelitis or osteonecrosis. ?-MRI of the left foot on 4/25 had shown possible osteomyelitis of the second digit, edema of the plantar aspect of the fourth and fifth metatarsal heads, no abscess ?-MRI right foot on 4/23 showed postsurgical changes, mildly heterogenous signal of the  proximal phalanx of second digit and proximal phalanx of fifth digit, early osteomyelitis cannot be excluded.  No abscess ?-Patient was admitted 4/23-5/1, the above imagings were discussed with Dr. Sharol Given who reviewed the MRI of the both feet and did not feel patient had any osteomyelitis and was recommended outpatient follow-up in 1 week.  Patient has not followed up, but discussed with Dr. Sharol Given given worsening wounds especially RLE. ?-Continue pain control ?-ABI in 3/23 were normal.  Venous Doppler ultrasound on 4/30, negative for DVT ? ?Active Problems: ?DVT brachial vein, right ?-Noted during the previous admission, this was discussed with vascular surgery, Dr. Quentin Cornwall had recommended anticoagulation for 3 months ?-Continue eliquis ? ?Chronic combined systolic and diastolic CHF ?-2D echo 0/10 had shown EF of less than 20% with grade 3 DD ?-Noncompliant, was hypotensive in ED, hence will hold hydralazine, Imdur, intractable.  Cardiology had not recommended Entresto due to nonadherence during previous admission ?- will continue Lasix, Coreg 3.125 mg twice daily ? ?COPD ?-Currently stable, no acute wheezing ? ?History of major depressive disorder, schizoaffective disorder, bipolar ?-Continue sitter, psychiatry has been consulted. ? ?History of prior CVA ?-Has residual dysarthria ?-Continue statin, eliquis ? ?  History of cocaine use ?-Counseled ? ?History of seizures ?-Continue Keppra ? ? ?Diabetes mellitus, type II, patient is not taking any medications at home ?-States that his CBGs run " good" at home and eats what ever diet he wants to. ?-Patient is adamant on having regular diet.  ?- Will place on sliding scale insulin ?- Previous A1c 7.0 on 03/24/2022 ? ? ?Homelessness ?-TOC consulted ? ? ?Code Status: Full CODE STATUS ?DVT Prophylaxis:   ?apixaban (ELIQUIS) tablet 5 mg  ? ?Level of Care: Level of care: Telemetry ?Family Communication: Updated patient ? ? ?Disposition Plan:      Remains inpatient appropriate:  Ongoing management of lower extremity wounds and cellulitis ? ? ?Procedures:  ?None ? ?Consultants:   ?Orthopedics ? ?Antimicrobials:  ? ?Anti-infectives (From admission, onward)  ? ? Start     Dose/Rate Route Frequency Ordered Stop  ? 04/05/22 1200  cefTRIAXone (ROCEPHIN) 1 g in sodium chloride 0.9 % 100 mL IVPB       ? 1 g ?200 mL/hr over 30 Minutes Intravenous Every 24 hours 04/04/22 1241    ? 04/04/22 2000  vancomycin (VANCOCIN) IVPB 1000 mg/200 mL premix       ? 1,000 mg ?200 mL/hr over 60 Minutes Intravenous 2 times daily 04/04/22 1227    ? 04/04/22 1145  vancomycin (VANCOCIN) IVPB 1000 mg/200 mL premix       ? 1,000 mg ?200 mL/hr over 60 Minutes Intravenous  Once 04/04/22 1141 04/04/22 1413  ? 04/04/22 1145  cefTRIAXone (ROCEPHIN) 2 g in sodium chloride 0.9 % 100 mL IVPB       ? 2 g ?200 mL/hr over 30 Minutes Intravenous  Once 04/04/22 1141 04/04/22 1257  ? 04/03/22 2200  doxycycline (VIBRA-TABS) tablet 100 mg  Status:  Discontinued       ? 100 mg Oral Every 12 hours 04/03/22 2128 04/04/22 1227  ? ?  ? ? ? ? ? ?Medications ? apixaban  5 mg Oral BID  ? atorvastatin  40 mg Oral Daily  ? carvedilol  3.125 mg Oral BID WC  ? folic acid  1 mg Oral Daily  ? furosemide  40 mg Oral BID  ? gabapentin  400 mg Oral TID  ? insulin aspart  0-15 Units Subcutaneous TID WC  ? levETIRAcetam  500 mg Oral BID  ? OLANZapine  5 mg Oral Daily  ? thiamine  100 mg Oral Daily  ? umeclidinium bromide  1 puff Inhalation Daily  ? ? ? ? ?Subjective:  ? ?Mataio Mele was seen and examined today.  Adamant on having regular diet, states pain is not well controlled.  States his wounds are getting worse and he has done everything that doctors tell him.  States he has been going to wound care but is not helping.  Currently frustrated and somewhat agitated. ? ?Objective:  ? ?Vitals:  ? 04/04/22 1900 04/04/22 2045 04/04/22 2127 04/05/22 0755  ?BP:  (!) 81/38 (!) 92/52 129/90  ?Pulse:  99 98 (!) 102  ?Resp: _0 ?Temp:  99.3 ?F (37.4 ?C)   97.9 ?F (36.6 ?C)  ?TempSrc:  Oral  Oral  ?SpO2:  97% 96% 100%  ? ? ?Intake/Output Summary (Last 24 hours) at 04/05/2022 1117 ?Last data filed at 04/05/2022 1100 ?Gross per 24 hour  ?Intake 2656.52 ml  ?Output 1400 ml  ?Net 1256.52 ml  ? ? ? ?Wt Readings from Last 3 Encounters:  ?03/31/22 83.1 kg  ?03/20/22 94.1 kg  ?  03/05/22 94.1 kg  ? ? ? ?Exam ?General: Alert and oriented x 3, NAD, somewhat agitated ?Cardiovascular: S1 S2 auscultated,  RRR ?Respiratory: Clear to auscultation bilaterally ?Gastrointestinal: Soft, nontender, nondistended, + bowel sounds ?Ext: no pedal edema bilaterally ?Neuro: Strength 5/5 upper and lower extremities bilaterally ?Skin: Bilateral lower extremity wounds ?Psych: somewhat agitated ? ?Right LE ? ? ? ? ?Left leg ? ? ?Data Reviewed:  I have personally reviewed following labs  ? ? ?CBC ?Lab Results  ?Component Value Date  ? WBC 7.5 04/05/2022  ? RBC 3.78 (L) 04/05/2022  ? HGB 11.6 (L) 04/05/2022  ? HCT 35.4 (L) 04/05/2022  ? MCV 93.7 04/05/2022  ? MCH 30.7 04/05/2022  ? PLT 311 04/05/2022  ? MCHC 32.8 04/05/2022  ? RDW 14.4 04/05/2022  ? LYMPHSABS 2.2 04/05/2022  ? MONOABS 0.9 04/05/2022  ? EOSABS 0.1 04/05/2022  ? BASOSABS 0.1 04/05/2022  ? ? ? ?Last metabolic panel ?Lab Results  ?Component Value Date  ? NA 138 04/05/2022  ? K 3.6 04/05/2022  ? CL 105 04/05/2022  ? CO2 22 04/05/2022  ? BUN 14 04/05/2022  ? CREATININE 1.06 04/05/2022  ? GLUCOSE 100 (H) 04/05/2022  ? GFRNONAA >60 04/05/2022  ? GFRAA >60 08/21/2020  ? CALCIUM 8.2 (L) 04/05/2022  ? PHOS 2.9 03/26/2022  ? PROT 6.4 (L) 04/05/2022  ? ALBUMIN 2.5 (L) 04/05/2022  ? BILITOT 0.9 04/05/2022  ? ALKPHOS 56 04/05/2022  ? AST 13 (L) 04/05/2022  ? ALT 12 04/05/2022  ? ANIONGAP 11 04/05/2022  ? ? ?CBG (last 3)  ?Recent Labs  ?  04/04/22 ?1841 04/04/22 ?2124 04/05/22 ?3212  ?GLUCAP 123* 142* 107*  ?  ? ? ? ? ? ? ?Estill Cotta M.D. ?[facility] ?04/05/2022, 11:17 AM ? ?Available via Epic secure chat 7am-7pm ?After 7 pm, please refer to night  coverage provider listed on amion. ? ? ? ?

## 2022-04-06 DIAGNOSIS — I82621 Acute embolism and thrombosis of deep veins of right upper extremity: Secondary | ICD-10-CM | POA: Diagnosis not present

## 2022-04-06 DIAGNOSIS — L03115 Cellulitis of right lower limb: Secondary | ICD-10-CM | POA: Diagnosis not present

## 2022-04-06 DIAGNOSIS — L0390 Cellulitis, unspecified: Secondary | ICD-10-CM | POA: Diagnosis not present

## 2022-04-06 DIAGNOSIS — G894 Chronic pain syndrome: Secondary | ICD-10-CM | POA: Diagnosis not present

## 2022-04-06 LAB — CBC WITH DIFFERENTIAL/PLATELET
Abs Immature Granulocytes: 0.02 10*3/uL (ref 0.00–0.07)
Basophils Absolute: 0.1 10*3/uL (ref 0.0–0.1)
Basophils Relative: 1 %
Eosinophils Absolute: 0.1 10*3/uL (ref 0.0–0.5)
Eosinophils Relative: 2 %
HCT: 34.8 % — ABNORMAL LOW (ref 39.0–52.0)
Hemoglobin: 11.5 g/dL — ABNORMAL LOW (ref 13.0–17.0)
Immature Granulocytes: 0 %
Lymphocytes Relative: 33 %
Lymphs Abs: 2.3 10*3/uL (ref 0.7–4.0)
MCH: 31.1 pg (ref 26.0–34.0)
MCHC: 33 g/dL (ref 30.0–36.0)
MCV: 94.1 fL (ref 80.0–100.0)
Monocytes Absolute: 0.8 10*3/uL (ref 0.1–1.0)
Monocytes Relative: 12 %
Neutro Abs: 3.6 10*3/uL (ref 1.7–7.7)
Neutrophils Relative %: 52 %
Platelets: 310 10*3/uL (ref 150–400)
RBC: 3.7 MIL/uL — ABNORMAL LOW (ref 4.22–5.81)
RDW: 14.1 % (ref 11.5–15.5)
WBC: 6.9 10*3/uL (ref 4.0–10.5)
nRBC: 0 % (ref 0.0–0.2)

## 2022-04-06 LAB — GLUCOSE, CAPILLARY
Glucose-Capillary: 148 mg/dL — ABNORMAL HIGH (ref 70–99)
Glucose-Capillary: 108 mg/dL — ABNORMAL HIGH (ref 70–99)
Glucose-Capillary: 176 mg/dL — ABNORMAL HIGH (ref 70–99)
Glucose-Capillary: 151 mg/dL — ABNORMAL HIGH (ref 70–99)

## 2022-04-06 LAB — BASIC METABOLIC PANEL
Anion gap: 8 (ref 5–15)
BUN: 14 mg/dL (ref 6–20)
CO2: 23 mmol/L (ref 22–32)
Calcium: 8.1 mg/dL — ABNORMAL LOW (ref 8.9–10.3)
Chloride: 105 mmol/L (ref 98–111)
Creatinine, Ser: 1.04 mg/dL (ref 0.61–1.24)
GFR, Estimated: >60 mL/min (ref >60)
Glucose, Bld: 115 mg/dL — ABNORMAL HIGH (ref 70–99)
Potassium: 3.9 mmol/L (ref 3.5–5.1)
Sodium: 136 mmol/L (ref 135–145)

## 2022-04-06 LAB — MAGNESIUM: Magnesium: 1.9 mg/dL (ref 1.7–2.4)

## 2022-04-06 NOTE — Progress Notes (Signed)
Pt refused VS check, pt became irritable and agitated at NT, last set of vitals at 2200 and VSS, RA, no signs of distress, will skip for now, allow pt to sleep, and attempt to check VS later in the am, sitter at bedside, call bell in reach, pt resting with eyes closed, will continue to monitor  ?

## 2022-04-06 NOTE — Progress Notes (Addendum)
?      ? ? ? [facility] ?                                                                           ? ? ?[name], is a 56 y.o. male, DOB - 04-Sep-1966, YDX:[id number] ?Admit date - 04/03/2022    ?Outpatient Primary MD for the patient is [name], [name], NP ? ?LOS - 2  days ? ?Chief Complaint  ?Patient presents with  ? Leg Pain  ?    ? ?Brief summary  ? ?Patient is a 56 year old male with history of asthma, bipolar, depression, schizoaffective disorder, chronic systolic and diastolic CHF, homelessness, GERD, hypertension, acute depigmentation, diabetes mellitus type 2, alcohol use, cocaine and cannabis use, tobacco use, presented to ED with complaints of leg pain and suicidal ideation.  Patient then became hypotensive and developed lactic acidosis.  He smokes 1 to 2 packs of cigarettes daily.  Denied any fever chills, decreased appetite, nausea, vomiting or any diarrhea.  No abdominal pain, chest pain. ? ?In ED, temp 98 ?F, pulse 102, respirations 20, BP initially 130/102, O2 sats 100% on room air.  Initially patient was supposed to be admitted to behavioral care, was seen by psychiatry and wound care.  However in ED, he became hypotensive with BP 76/62 and developed lactic acidosis.   ?He was admitted due to possible sepsis from the lower extremity wounds ? ?Assessment & Plan  ? ? ?Principal Problem: ?  Sepsis due to cellulitis [name]), lower extremity wounds ?-Patient met sepsis criteria due to hypotension, leukocytosis, lactic acidosis, source likely due to lower extremity cellulitis and wounds ?-MRI of the left ankle on 4/25 had shown no osteomyelitis or osteonecrosis. ?-MRI of the left foot on 4/25 had shown possible osteomyelitis of the second digit, edema of the plantar aspect of the fourth and fifth metatarsal heads, no abscess ?-MRI right foot on 4/23 showed postsurgical changes, mildly heterogenous signal of the proximal phalanx of second digit and proximal phalanx of fifth digit, early  osteomyelitis cannot be excluded.  No abscess ?-Patient was admitted 4/23-5/1, previous MD discussed with Dr. Sharol Given who reviewed the MRI of the both feet and at that time, did not feel patient needed any surgical intervention as no osteomyelitis, was recommended outpatient follow-up in 1 week  ?- Patient has not followed up, will discuss with Dr. Sharol Given given worsening wounds especially RLE. ?-Continue pain control ?-ABI in 3/23 were normal.  Venous Doppler ultrasound on 4/30, negative for DVT ?-Continue IV vancomycin and Rocephin ? ?Active Problems: ?DVT brachial vein, right ?-Noted during the previous admission, this was discussed with vascular surgery, Dr. Quentin Cornwall had recommended anticoagulation for 3 months ?-Continue eliquis ? ?Chronic combined systolic and diastolic CHF ?-2D echo 7/20 had shown EF of less than 20% with grade 3 DD ?-Noncompliant, was hypotensive in ED, hold hydralazine, Imdur, Aldactone  ?-Cardiology had not recommended Entresto due to nonadherence during previous admission ?- will continue Lasix, Coreg 3.125 mg twice daily ? ?COPD ?-Currently stable, no acute wheezing ? ?History of major depressive disorder, schizoaffective disorder, bipolar ?-Continue sitter, psychiatry has been consulted. ?-Patient was IVC'ed on admission ? ?History of prior CVA ?-Has residual dysarthria ?-Continue statin,  eliquis ? ?History of cocaine use ?-Counseled ? ?History of seizures ?-Continue Keppra ? ? ?Diabetes mellitus, type II, patient is not taking any medications at home ?-States that his CBGs run " good" at home and eats what ever diet he wants to. ?-Patient is adamant on having regular diet.  ?- Will place on sliding scale insulin ?- Previous A1c 7.0 on 03/24/2022 ? ? ?Homelessness ?-TOC consulted ? ? ?Code Status: Full CODE STATUS ?DVT Prophylaxis:   ?apixaban (ELIQUIS) tablet 10 mg  ?apixaban (ELIQUIS) tablet 5 mg  ? ?Level of Care: Level of care: Telemetry ?Family Communication: Updated  patient ? ? ?Disposition Plan:      Remains inpatient appropriate: Ongoing management of lower extremity wounds and cellulitis, psychiatry following for suicidal ideation. ? ? ?Procedures:  ?None ? ?Consultants:   ?Orthopedics ? ?Antimicrobials:  ? ?Anti-infectives (From admission, onward)  ? ? Start     Dose/Rate Route Frequency Ordered Stop  ? 04/05/22 1200  cefTRIAXone (ROCEPHIN) 1 g in sodium chloride 0.9 % 100 mL IVPB       ? 1 g ?200 mL/hr over 30 Minutes Intravenous Every 24 hours 04/04/22 1241    ? 04/04/22 2000  vancomycin (VANCOCIN) IVPB 1000 mg/200 mL premix       ? 1,000 mg ?200 mL/hr over 60 Minutes Intravenous 2 times daily 04/04/22 1227    ? 04/04/22 1145  vancomycin (VANCOCIN) IVPB 1000 mg/200 mL premix       ? 1,000 mg ?200 mL/hr over 60 Minutes Intravenous  Once 04/04/22 1141 04/04/22 1413  ? 04/04/22 1145  cefTRIAXone (ROCEPHIN) 2 g in sodium chloride 0.9 % 100 mL IVPB       ? 2 g ?200 mL/hr over 30 Minutes Intravenous  Once 04/04/22 1141 04/04/22 1257  ? 04/03/22 2200  doxycycline (VIBRA-TABS) tablet 100 mg  Status:  Discontinued       ? 100 mg Oral Every 12 hours 04/03/22 2128 04/04/22 1227  ? ?  ? ? ? ? ? ?Medications ? apixaban  10 mg Oral BID  ? Followed by  ? [START ON 04/12/2022] apixaban  5 mg Oral BID  ? ARIPiprazole  5 mg Oral Daily  ? atorvastatin  40 mg Oral Daily  ? carvedilol  3.125 mg Oral BID WC  ? feeding supplement  237 mL Oral BID BM  ? folic acid  1 mg Oral Daily  ? furosemide  40 mg Oral BID  ? gabapentin  400 mg Oral TID  ? insulin aspart  0-15 Units Subcutaneous TID WC  ? levETIRAcetam  500 mg Oral BID  ? multivitamin with minerals  1 tablet Oral Daily  ? nutrition supplement (JUVEN)  1 packet Oral BID BM  ? thiamine  100 mg Oral Daily  ? umeclidinium bromide  1 puff Inhalation Daily  ? ? ? ? ?Subjective:  ? ?[name] was seen and examined today.  Somewhat hostile and agitated behavior.   ? ?Objective:  ? ?Vitals:  ? 04/05/22 0755 04/05/22 1604 04/05/22 1900 04/05/22  2104  ?BP: 129/90 (!) 118/55  101/70  ?Pulse: (!) 102 91  94  ?Resp:  16  19  ?Temp: 97.9 ?F (36.6 ?C) 98.4 ?F (36.9 ?C)  99.6 ?F (37.6 ?C)  ?TempSrc: Oral Oral  Oral  ?SpO2: 100% 93%  95%  ?Weight:   83.1 kg   ?Height:   '5\' 8"'  (1.727 m)   ? ? ?Intake/Output Summary (Last 24 hours) at 04/06/2022 1303 ?Last data  filed at 04/06/2022 1225 ?Gross per 24 hour  ?Intake 2346.15 ml  ?Output 2590 ml  ?Net -243.85 ml  ? ? ? ?Wt Readings from Last 3 Encounters:  ?04/05/22 83.1 kg  ?03/31/22 83.1 kg  ?03/20/22 94.1 kg  ? ?Physical Exam ?General: Alert and oriented x 3, NAD ?Cardiovascular: S1 S2 clear, RRR. No pedal edema b/l ?Respiratory: CTAB, no wheezing, rales or rhonchi ?Gastrointestinal: Soft, nontender, nondistended, NBS ?Ext: no pedal edema bilaterally ?Neuro: no new deficits ?Skin: See below ?Psych: Normal affect and demeanor, alert and oriented x3  ? ?Right LE ? ? ? ? ?Left leg ? ? ?Data Reviewed:  I have personally reviewed following labs  ? ? ?CBC ?Lab Results  ?Component Value Date  ? WBC 6.9 04/06/2022  ? RBC 3.70 (L) 04/06/2022  ? HGB 11.5 (L) 04/06/2022  ? HCT 34.8 (L) 04/06/2022  ? MCV 94.1 04/06/2022  ? MCH 31.1 04/06/2022  ? PLT 310 04/06/2022  ? MCHC 33.0 04/06/2022  ? RDW 14.1 04/06/2022  ? LYMPHSABS 2.3 04/06/2022  ? MONOABS 0.8 04/06/2022  ? EOSABS 0.1 04/06/2022  ? BASOSABS 0.1 04/06/2022  ? ? ? ?Last metabolic panel ?Lab Results  ?Component Value Date  ? NA 138 04/05/2022  ? K 3.6 04/05/2022  ? CL 105 04/05/2022  ? CO2 22 04/05/2022  ? BUN 14 04/05/2022  ? CREATININE 1.06 04/05/2022  ? GLUCOSE 100 (H) 04/05/2022  ? GFRNONAA >60 04/05/2022  ? GFRAA >60 08/21/2020  ? CALCIUM 8.2 (L) 04/05/2022  ? PHOS 2.9 03/26/2022  ? PROT 6.4 (L) 04/05/2022  ? ALBUMIN 2.5 (L) 04/05/2022  ? BILITOT 0.9 04/05/2022  ? ALKPHOS 56 04/05/2022  ? AST 13 (L) 04/05/2022  ? ALT 12 04/05/2022  ? ANIONGAP 11 04/05/2022  ? ? ?CBG (last 3)  ?Recent Labs  ?  04/05/22 ?2126 04/06/22 ?0728 04/06/22 ?1150  ?GLUCAP 281* 148* 108*  ?   ? ? ? ? ? ? ?Estill Cotta M.D. ?[facility] ?04/06/2022, 1:03 PM ? ?Available via Epic secure chat 7am-7pm ?After 7 pm, please refer to night coverage provider listed on amion. ? ? ? ?

## 2022-04-06 NOTE — Consult Note (Signed)
[facility] New Face-to-Face Psychiatric Evaluation ? ? ?Service Date: Apr 06, 2022 ?LOS:  LOS: 2 days  ? ? ?Assessment  ?[name] is a 56 y.o. male admitted medically for 04/03/2022  1:50 PM for sepsis physiology 2/2 chronic unhealing leg wounds. He carries the psychiatric diagnoses of schizoaffective disorder and multiple substance use disorders and has a past medical history of   asthma, bipolar, depression, schizoaffective disorder, chronic systolic and diastolic CHF, homelessness, GERD, hypertension, acute depigmentation, diabetes mellitus type 2, alcohol use, cocaine and cannabis use, tobacco use. Psychiatry was consulted for suicidal ideations by [name].  ? ?This patient is well known to the psychiatric consult service. He typically presents with suicidal ideations secondary to unmanaged pain and/or unmet psychosocial needs which resolves when one or both of the above conditions is managed. Initial presentation was different than prior presentations - he had significantly more disorganized thought process, circumstantial (at times almost tangential) associations. There was some improvement on reassessment 5/7 - pt did become agitated at multiple points in interview but apologized each time, demonstrated some future-oriented thoughts, and  It is unlikely that pt will benefit from inpt psychiatric hospitalization due to long history of non-compliance with most medications and concern for secondary gain, however will continue sitter at present moment while inpt given acuity of threats as leg wounds are treated. In any case pt not medically stable. Will uphold IVC/sitter and revisit decision daily - expect improvement in sx after pt meets with orthopedic surgeon tomorrow. ? ?Diagnoses:  ?Active Hospital problems: ?Principal Problem: ?  Sepsis due to cellulitis [facility]) ?Active Problems: ?  Schizoaffective disorder, bipolar type (HCC) ?  Homeless ?  Suicidal ideation ?  Heart failure with  reduced ejection fraction and diastolic dysfunction (HCC) ?  Homelessness ?  Type 2 diabetes mellitus with diabetic polyneuropathy, without long-term current use of insulin (HCC) ?  Acute deep vein thrombosis (DVT) of brachial vein (HCC) ?  Normocytic anemia ?  ? ? ?Plan  ?## Safety and Observation Level:  ?- Based on my clinical evaluation, I estimate the patient to be at high risk of self harm in the current setting ?- At this time, we recommend a 1:1 level of observation. This decision is based on my review of the chart including patient's history and current presentation, interview of the patient, mental status examination, and consideration of suicide risk including evaluating suicidal ideation, plan, intent, suicidal or self-harm behaviors, risk factors, and protective factors. This judgment is based on our ability to directly address suicide risk, implement suicide prevention strategies and develop a safety plan while the patient is in the clinical setting. Please contact our team if there is a concern that risk level has changed. ? ? ?## Medications:  ?-- CONTINUE aripiprazole (pt with historic benefit, has LAI) ? ?## Medical Decision Making Capacity:  ?Not formally assessed ? ?## Further Work-up:  ?-- please monitor and keep K+>4 and Mg+>2 ? ? ? ?-- most recent EKG on 5/4 had QtC of 465 ?-- Pertinent labwork reviewed earlier this admission includes: EtOH <10, UDS + cocaine and THC, CBC max 15.6 now downtrending on abx, CMP with hypoalbuminemia  ? ?## Disposition:  ?-- will reassess daily, currently not medically stable ? ?Thank you for this consult request. Recommendations have been communicated to the primary team.  We will continue to follow at this time.  ? ?[name] ? ? ?History   ?Relevant Aspects of Hospital Course:  ?Admitted on 04/03/2022  for sepsis d/t lower extremity leg wounds. ? ?Patient Report:  ?Patient seen in afternoon. Overall improved from yesterday.  ? ?Does become agitated at  various points in this conversation (accuses this Chartered loss adjuster of lying about when orthopedic surgery would come, a conversation which did not take place) but apologizes each time especially when based on misunderstanding. Becomes agitated and states "you never talk to me about my medications"; offer to discuss abilify was declined by pt. Mood today is "so-so". Able to make some future oriented statements - hoping his foot is amputated so he doesn't have to deal with the pain anymore.  Continues to make numerous suicidal statements. States "one thing you don't know is my mom committed suicide" (had discussed this yesterday).  ? ?ROS:  agitation, tangential thoughts, less paranoid than yesterday ? ?Collateral information:  ?Made suicidal statements in ED per documentation  ? ?Psychiatric History:  ?Information collected from pt, chart ? ?Family psych history: mother with completed suicide ? ? ?Social History:  ?Homeless ? ?Tobacco use: yes ?Alcohol use: not recent ?Drug use: yes ? ?Family History:  ?The patient's family history includes Drug abuse in his mother; Suicidality in his mother. ? ?Medical History: ?Past Medical History:  ?Diagnosis Date  ? Asthma   ? Bipolar 1 disorder (HCC)   ? CHF (congestive heart failure) (HCC)   ? COPD (chronic obstructive pulmonary disease) (HCC)   ? Depression   ? Diabetes mellitus without complication (HCC)   ? GERD (gastroesophageal reflux disease)   ? Homelessness   ? Hypertension   ? QT prolongation   ? Schizoaffective disorder (HCC)   ? ? ?Surgical History: ?Past Surgical History:  ?Procedure Laterality Date  ? 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 Procedure: AMPUTATION TOE;  Surgeon: Park Liter, DPM;  Location: [facility] OR;  Service: Podiatry;  Laterality: Bilateral;  Ok to leave patient on hospital bed  ? IR ANGIO INTRA EXTRACRAN SEL COM CAROTID INNOMINATE BILAT MOD SED  12/17/2020  ? IR ANGIO VERTEBRAL SEL VERTEBRAL BILAT MOD SED  12/17/2020  ? RADIOLOGY WITH ANESTHESIA N/A 12/17/2020   ? Procedure: IR WITH ANESTHESIA;  Surgeon: Radiologist, Medication, MD;  Location: [facility] OR;  Service: Radiology;  Laterality: N/A;  ? ? ?Medications:  ? ?Current Facility-Administered Medications:  ?  albuterol (PROVENTIL) (2.5 MG/3ML) 0.083% nebulizer solution 2.5 mg, 2.5 mg, Nebulization, Q6H PRN, Bobette Mo, MD, 2.5 mg at 04/05/22 1537 ?  apixaban (ELIQUIS) tablet 10 mg, 10 mg, Oral, BID, 10 mg at 04/06/22 0953 **FOLLOWED BY** [START ON 04/12/2022] apixaban (ELIQUIS) tablet 5 mg, 5 mg, Oral, BID, [name], [name] K, MD ?  ARIPiprazole (ABILIFY) tablet 5 mg, 5 mg, Oral, Daily, Neriyah Cercone A, 5 mg at 04/06/22 0160 ?  atorvastatin (LIPITOR) tablet 40 mg, 40 mg, Oral, Daily, Bobette Mo, MD, 40 mg at 04/06/22 1093 ?  carvedilol (COREG) tablet 3.125 mg, 3.125 mg, Oral, BID WC, Bobette Mo, MD, 3.125 mg at 04/06/22 1639 ?  cefTRIAXone (ROCEPHIN) 1 g in sodium chloride 0.9 % 100 mL IVPB, 1 g, Intravenous, Q24H, Bobette Mo, MD, Last Rate: 200 mL/hr at 04/06/22 1222, Infusion Verify at 04/06/22 1222 ?  feeding supplement (ENSURE ENLIVE / ENSURE PLUS) liquid 237 mL, 237 mL, Oral, BID BM, [name], [name] K, MD, 237 mL at 04/06/22 1639 ?  fentaNYL (SUBLIMAZE) injection 25 mcg, 25 mcg, Intravenous, Q4H PRN, [name], [name] K, MD, 25 mcg at 04/06/22 0007 ?  folic acid (FOLVITE) tablet 1 mg, 1 mg,  Oral, Daily, Bobette Mo, MD, 1 mg at 04/06/22 4270 ?  furosemide (LASIX) tablet 40 mg, 40 mg, Oral, BID, Bobette Mo, MD, 40 mg at 04/06/22 6237 ?  gabapentin (NEURONTIN) capsule 400 mg, 400 mg, Oral, TID, Bobette Mo, MD, 400 mg at 04/06/22 1639 ?  insulin aspart (novoLOG) injection 0-15 Units, 0-15 Units, Subcutaneous, TID WC, Bobette Mo, MD, 2 Units at 04/06/22 0740 ?  levETIRAcetam (KEPPRA) tablet 500 mg, 500 mg, Oral, BID, Bobette Mo, MD, 500 mg at 04/06/22 6283 ?  multivitamin with minerals tablet 1 tablet, 1 tablet, Oral, Daily, [name], [name] K, MD, 1  tablet at 04/06/22 1517 ?  nutrition supplement (JUVEN) (JUVEN) powder packet 1 packet, 1 packet, Oral, BID BM, [name], [name] K, MD, 1 packet at 04/05/22 2113 ?  oxyCODONE (Oxy IR/ROXICODONE) immediate

## 2022-04-07 DIAGNOSIS — G894 Chronic pain syndrome: Secondary | ICD-10-CM | POA: Diagnosis not present

## 2022-04-07 DIAGNOSIS — A419 Sepsis, unspecified organism: Secondary | ICD-10-CM | POA: Diagnosis not present

## 2022-04-07 DIAGNOSIS — I87333 Chronic venous hypertension (idiopathic) with ulcer and inflammation of bilateral lower extremity: Secondary | ICD-10-CM

## 2022-04-07 DIAGNOSIS — F1410 Cocaine abuse, uncomplicated: Secondary | ICD-10-CM

## 2022-04-07 DIAGNOSIS — L03115 Cellulitis of right lower limb: Secondary | ICD-10-CM | POA: Diagnosis not present

## 2022-04-07 DIAGNOSIS — F1210 Cannabis abuse, uncomplicated: Secondary | ICD-10-CM

## 2022-04-07 DIAGNOSIS — F1010 Alcohol abuse, uncomplicated: Secondary | ICD-10-CM

## 2022-04-07 DIAGNOSIS — L0390 Cellulitis, unspecified: Secondary | ICD-10-CM | POA: Diagnosis not present

## 2022-04-07 DIAGNOSIS — I82621 Acute embolism and thrombosis of deep veins of right upper extremity: Secondary | ICD-10-CM | POA: Diagnosis not present

## 2022-04-07 DIAGNOSIS — F32A Depression, unspecified: Secondary | ICD-10-CM

## 2022-04-07 DIAGNOSIS — R45851 Suicidal ideations: Secondary | ICD-10-CM

## 2022-04-07 LAB — GLUCOSE, CAPILLARY
Glucose-Capillary: 105 mg/dL — ABNORMAL HIGH (ref 70–99)
Glucose-Capillary: 161 mg/dL — ABNORMAL HIGH (ref 70–99)
Glucose-Capillary: 122 mg/dL — ABNORMAL HIGH (ref 70–99)
Glucose-Capillary: 186 mg/dL — ABNORMAL HIGH (ref 70–99)

## 2022-04-07 LAB — CREATININE, SERUM
Creatinine, Ser: 0.94 mg/dL (ref 0.61–1.24)
GFR, Estimated: >60 mL/min (ref >60)

## 2022-04-07 NOTE — Progress Notes (Signed)
Pharmacy Antibiotic Note ? ?[name] is a 56 y.o. male admitted on 04/03/2022 with sepsis d/t suspected infection of chronic LE wounds.  Pharmacy has been consulted for vancomycin dosing. ? ?04/07/2022 ?Abx D#4 ?Afebrile, WBC WNL (5/7)  ?PCT negative 5/6 ?SCr remains WNL ?Plans for TRH to discuss w/ Dr. Lajoyce Corners ? ? ?Plan: ?Vancomycin 1 mg IV q12 hr (est AUC 444 based on SCr 0.97; Vd 0.72) ?Measure vancomycin AUC at steady state as indicated ?SCr q48 while on vanc ?Rocephin 1 gm IV q24 per MD ?F/u ortho/Duda recs ?? LOT, transition to oral antibiotics ? ?Height: 5\' 8"  (172.7 cm) ?Weight: 83.1 kg (183 lb 3.2 oz) ?IBW/kg (Calculated) : 68.4 ? ?Temp (24hrs), Avg:98.3 ?F (36.8 ?C), Min:98.2 ?F (36.8 ?C), Max:98.5 ?F (36.9 ?C) ? ?Recent Labs  ?Lab 04/03/22 ?1723 04/04/22 ?1016 04/04/22 ?1254 04/05/22 ?0607 04/06/22 ?0730 04/07/22 ?0459  ?WBC 15.6* 10.6*  --  7.5 6.9  --   ?CREATININE 0.99 0.97  --  1.06 1.04 0.94  ?LATICACIDVEN  --  2.7* 1.6  --   --   --   ? ?  ?Estimated Creatinine Clearance: 95.5 mL/min (by C-G formula based on SCr of 0.94 mg/dL).   ? ?Allergies  ?Allergen Reactions  ? Ibuprofen Anaphylaxis  ? Acetaminophen Hives, Nausea And Vomiting and Rash  ?  Pt overdosed on bottle of APAP 05/06/16 ?  ? Lyrica Cr [Pregabalin Er] Other (See Comments)  ?  Dystonia and dysarthria  ? Peanut-Containing Drug Products Other (See Comments)  ?  Pt denies allergy to peanut butter 03/02/2021  ? ? ?Antimicrobials this admission: ?5/4 doxy> 5/5 ?5/5 vancomycin >>  ?5/5 Rocephin >>  ?Dose adjustments this admission: n/a ? ?Microbiology results: ?5/5 BCx: ngtd ? ? ?Thank you for allowing pharmacy to be a part of this patient?s care. ? ?7/4, Pharm.D ?04/07/2022 8:07 AM ? ?

## 2022-04-07 NOTE — Consult Note (Signed)
? ? ?ORTHOPAEDIC CONSULTATION ? ?REQUESTING PHYSICIAN: Rai, Delene Ruffini, MD ? ?Chief Complaint: Chronic ulceration bilateral lower extremities. ? ?HPI: ?[name] is a 56 y.o. male who presents with persistent nonhealing venous ulcers bilateral lower extremities patient states he has been going to the wound center. ? ?Past Medical History:  ?Diagnosis Date  ? Asthma   ? Bipolar 1 disorder (HCC)   ? CHF (congestive heart failure) (HCC)   ? COPD (chronic obstructive pulmonary disease) (HCC)   ? Depression   ? Diabetes mellitus without complication (HCC)   ? GERD (gastroesophageal reflux disease)   ? Homelessness   ? Hypertension   ? QT prolongation   ? Schizoaffective disorder (HCC)   ? ?Past Surgical History:  ?Procedure Laterality Date  ? 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 Procedure: AMPUTATION TOE;  Surgeon: Park Liter, DPM;  Location: [facility] OR;  Service: Podiatry;  Laterality: Bilateral;  Ok to leave patient on hospital bed  ? IR ANGIO INTRA EXTRACRAN SEL COM CAROTID INNOMINATE BILAT MOD SED  12/17/2020  ? IR ANGIO VERTEBRAL SEL VERTEBRAL BILAT MOD SED  12/17/2020  ? RADIOLOGY WITH ANESTHESIA N/A 12/17/2020  ? Procedure: IR WITH ANESTHESIA;  Surgeon: Radiologist, Medication, MD;  Location: [facility] OR;  Service: Radiology;  Laterality: N/A;  ? ?Social History  ? ?Socioeconomic History  ? Marital status: Single  ?  Spouse name: Not on file  ? Number of children: 4  ? Years of education: Not on file  ? Highest education level: Not on file  ?Occupational History  ? Occupation: N/A  ?Tobacco Use  ? Smoking status: Every Day  ?  Years: 30.00  ?  Types: Cigarettes  ? Smokeless tobacco: Never  ?Vaping Use  ? Vaping Use: Never used  ?Substance and Sexual Activity  ? Alcohol use: Yes  ?  Comment: infrequently  ? Drug use: Yes  ?  Types: Cocaine, Marijuana, "Crack" cocaine  ? Sexual activity: Not Currently  ?  Partners: Female  ?  Birth control/protection: None  ?Other Topics Concern  ? Not on file  ?Social History  Narrative  ? ** Merged History Encounter **  ?    ? ?Social Determinants of Health  ? ?Financial Resource Strain: Not on file  ?Food Insecurity: Not on file  ?Transportation Needs: Not on file  ?Physical Activity: Not on file  ?Stress: Not on file  ?Social Connections: Not on file  ? ?Family History  ?Problem Relation Age of Onset  ? Drug abuse Mother   ? Suicidality Mother   ? ?- negative except otherwise stated in the family history section ?Allergies  ?Allergen Reactions  ? Ibuprofen Anaphylaxis  ? Acetaminophen Hives, Nausea And Vomiting and Rash  ?  Pt overdosed on bottle of APAP 05/06/16 ?  ? Lyrica Cr [Pregabalin Er] Other (See Comments)  ?  Dystonia and dysarthria  ? Peanut-Containing Drug Products Other (See Comments)  ?  Pt denies allergy to peanut butter 03/02/2021  ? ?Prior to Admission medications   ?Medication Sig Start Date End Date Taking? Authorizing Provider  ?albuterol (VENTOLIN HFA) 108 (90 Base) MCG/ACT inhaler Inhale 2 puffs into the lungs every 6 (six) hours as needed for wheezing or shortness of breath. 03/07/22  Yes Meredeth Ide, MD  ?apixaban (ELIQUIS) 5 MG TABS tablet Take 2 tablets by mouth 2 times daily x 7 days, then start taking 1 tablet by mouth  twice daily from 04/07/22 04/06/22  Yes Sharl Ma, Sarina Ill, MD  ?carvedilol (  COREG) 3.125 MG tablet Take 1 tablet (3.125 mg total) by mouth 2 (two) times daily with a meal. 03/07/22  Yes Sharl Ma, Sarina Ill, MD  ?folic acid (FOLVITE) 1 MG tablet Take 1 tablet (1 mg total) by mouth daily. 03/08/22  Yes Meredeth Ide, MD  ?furosemide (LASIX) 40 MG tablet Take 1 tablet (40 mg total) by mouth 2 (two) times daily. 03/07/22 03/07/23 Yes Meredeth Ide, MD  ?gabapentin (NEURONTIN) 400 MG capsule Take 1 capsule (400 mg total) by mouth 3 (three) times daily. 03/07/22  Yes Meredeth Ide, MD  ?hydrALAZINE (APRESOLINE) 25 MG tablet Take 1 tablet (25 mg total) by mouth 3 (three) times daily. 03/07/22 07/05/22 Yes Meredeth Ide, MD  ?isosorbide mononitrate (IMDUR) 60 MG 24 hr tablet Take 1  tablet (60 mg total) by mouth daily. 03/07/22 07/05/22 Yes Meredeth Ide, MD  ?levETIRAcetam (KEPPRA) 500 MG tablet Take 1 tablet (500 mg total) by mouth 2 (two) times daily. 03/07/22 04/06/22 Yes Meredeth Ide, MD  ?OLANZapine (ZYPREXA) 5 MG tablet Take 1 tablet (5 mg total) by mouth daily. 03/07/22  Yes Meredeth Ide, MD  ?oxyCODONE (OXY IR/ROXICODONE) 5 MG immediate release tablet Take 1-2 tablets (5-10 mg total) by mouth every 6 (six) hours as needed for severe pain or breakthrough pain. 03/31/22  Yes Meredeth Ide, MD  ?spironolactone (ALDACTONE) 25 MG tablet Take 1/2 tablet (12.5 mg total) by mouth daily. 03/08/22  Yes Meredeth Ide, MD  ?tiotropium (SPIRIVA HANDIHALER) 18 MCG inhalation capsule Place 1 capsule (18 mcg total) into inhaler and inhale daily as directed. 03/07/22 06/05/22 Yes Meredeth Ide, MD  ?valsartan (DIOVAN) 40 MG tablet Take 1/2 tablet (20 mg total) by mouth 2 (two) times daily. 03/07/22 03/07/23 Yes Meredeth Ide, MD  ?atorvastatin (LIPITOR) 10 MG tablet Take 4 tablets (40 mg total) by mouth daily. ?Patient not taking: Reported on 04/04/2022 03/07/22   Meredeth Ide, MD  ?thiamine 100 MG tablet Take 1 tablet (100 mg total) by mouth daily. ?Patient not taking: Reported on 04/04/2022 03/08/22   Meredeth Ide, MD  ? ?No results found. ?- pertinent xrays, CT, MRI studies were reviewed and independently interpreted ? ?Positive ROS: All other systems have been reviewed and were otherwise negative with the exception of those mentioned in the HPI and as above. ? ?Physical Exam: ?General: Alert, no acute distress ?Psychiatric: Patient is competent for consent with normal mood and affect ?Lymphatic: No axillary or cervical lymphadenopathy ?Cardiovascular: No pedal edema ?Respiratory: No cyanosis, no use of accessory musculature ?GI: No organomegaly, abdomen is soft and non-tender ? ? ? ?Images: ? ?@ENCIMAGES @ ? ?Labs: ? ?Lab Results  ?Component Value Date  ? HGBA1C 7.0 (H) 03/24/2022  ? HGBA1C 5.8 (H) 11/13/2021  ? HGBA1C 6.4  (H) 12/18/2020  ? ESRSEDRATE 63 (H) 03/23/2022  ? ESRSEDRATE 33 (H) 11/13/2020  ? CRP 10.6 (H) 03/23/2022  ? CRP 3.3 (H) 12/03/2021  ? CRP 10.8 (H) 11/13/2020  ? REPTSTATUS PENDING 04/04/2022  ? REPTSTATUS PENDING 04/04/2022  ? GRAMSTAIN  01/02/2021  ?  NO WBC SEEN ?FEW GRAM POSITIVE COCCI ?RARE GRAM NEGATIVE RODS ?Performed at [facility], [address] [place], [name] ?  ? CULT  04/04/2022  ?  NO GROWTH 3 DAYS ?Performed at [facility], [address] [place], [name] ?  ? CULT  04/04/2022  ?  NO GROWTH 3 DAYS ?Performed at [facility],  [address] [place], [name] [address] ?  ? LABORGA ENTEROBACTER CLOACAE (A) 01/29/2022  ? ? ?Lab Results  ?Component Value Date  ? ALBUMIN 2.5 (L) 04/05/2022  ? ALBUMIN 3.7 04/03/2022  ? ALBUMIN 2.7 (L) 03/26/2022  ? ? ? ? ?  Latest Ref Rng & Units 04/06/2022  ?  7:30 AM 04/05/2022  ?  6:07 AM 04/04/2022  ? 10:16 AM  ?CBC EXTENDED  ?WBC 4.0 - 10.5 K/uL 6.9   7.5   10.6    ?RBC 4.22 - 5.81 MIL/uL 3.70   3.78   4.00    ?Hemoglobin 13.0 - 17.0 g/dL 67.1   24.5   80.9    ?HCT 39.0 - 52.0 % 34.8   35.4   37.0    ?Platelets 150 - 400 K/uL 310   311   338    ?NEUT# 1.7 - 7.7 K/uL 3.6   4.2   7.6    ?Lymph# 0.7 - 4.0 K/uL 2.3   2.2   1.9    ? ? ?Neurologic: Patient does not have protective sensation bilateral lower extremities. ? ? ?MUSCULOSKELETAL:  ? ?Skin: Examination patient has black eschar over a wound bed with healthy granulation tissue large ulceration on the right lower extremity much smaller ulceration to the left lower extremity. ? ?Patient has brawny edema in both lower extremities consistent with venous insufficiency and hard indurated skin also consistent with lymphatic insufficiency. ? ?I cannot palpate a pulse but most recent ankle-brachial indices shows normal waveform with normal circulation. ? ?Albumin 2.5 with a hemoglobin 11.5 white cell count of 6.9.  Neutrophil to lymphocyte ratio is normal. ? ?Review of patient's most  recent MRI scan of both ankles shows no abscess there is some edema in the second toe left foot.  Clinically there is no sausage digit swelling no open draining ulcer.  No clinical signs of infection to the toe.

## 2022-04-07 NOTE — Consult Note (Signed)
[facility] Face-to-Face Psychiatry Consult  ? ?Reason for Consult:  Suicidal ideations ?Referring Physician:  Dr. Florene Glen ?Patient Identification: [name] ?MRN:  [id number] ?Principal Diagnosis: Sepsis due to cellulitis [facility]) ?Diagnosis:  Principal Problem: ?  Sepsis due to cellulitis [facility] ?Active Problems: ?  Schizoaffective disorder, bipolar type ([place]) ?  Homeless ?  Suicidal ideation ?  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  Homelessness ?  Type 2 diabetes mellitus with diabetic polyneuropathy, without long-term current use of insulin ([place]) ?  Acute deep vein thrombosis (DVT) of brachial vein (HCC) ?  Normocytic anemia ? ? ?Total Time spent with patient: 20 minutes ? ?Subjective:   ?[name] is a 56 y.o. male patient admitted with Depression, hx  Schizoaffective disorder,of bipolar, schizophrenia and cocaine abuse brought to the ER for medical issues with bilateral lower extremity pain and swelling.  Patient has a history of noncompliance, concomitant polysubstance abuse of alcohol and cocaine.  Patient is seen and assessed, case discussed with Dr. Dwyane Dee.  Patient is noted to have slurred speech and severe dysarthria, which gets worse when upset. He seemingly was able to control his frustration today, and marked improvement in his dysarthria.   ? ? On initial examination, as noted patient is irritable however brightens upon approach. He is able to verbalize his frustration related to his increase levels of pain from his gangrenous distal extremities. He states he is hopeful the suregeon will be able to provide relief through amputation. He suggests his suicidal ideation and depression are strongly related to his pain.  He denies most acute psychiatric symptoms, to include suicidal ideations.   ? ?On evaluation patient is observed to be sleeping, however does wake up and acknowldeg entry into the room.   Patient is alert and oriented x3, cooperative at times, irritable  and easily frustrated, and argumentative.  He describes his mood as depressed and irritable, his affect is congruent with his stated mood.   He admits to history of suicide attempts, however fails to elaborate.  He denies access to any weapon.  He denies any acute psychosis to include paranoia, auditory or visual hallucinations, psychosis, delusions.  He admits to using cocaine and alcohol, almost daily however is unable to quantify how much of each substance he uses.  He currently denies any active suicidal ideations, suicidal intentions, and or suicidal thoughts at this time.  He does not appear to be exhibiting an or displaying response to internal stimuli, external stimuli, delusional thought disorder.  There was no evidence of disorganized thought process, or derailment. ? ?Patient states he has been off his medications for schizophrenia, since his last hospitalization.  He further reports these medications were not helping him but making him more sick, which is why he continues to use street substances.  Patient denies having any outpatient providers.  Thankfully patient is agreeable to resuming his home medication of Abilify.  ? ?HPI:  AA male, 56 years old brought in by EMS for SOB, SI and feet wound.  Patient is homeless and frequents the ER for Medical care.   ?He notes pain has been present for months.  Recently went to wound care.  Doesn't feel like he's making any progress.  Describes sharp shooting pain in bilateral legs at times.  Right seems worse.  In addition, he's noticed shortness of breath.  He expressed suicidal thoughts to EDP.  Notes recent cocaine use.  Says it helps with the pain.  Notes occasional drinking, says  not every day.  ? ?Past Psychiatric History: Per Patient and old records-Depression, Schizoaffective disorder,hx of bipolar, schizophrenia and cocaine abuse. Pt reports several hospitalizations , more than he is able to count and has had several suicide attempts .  ? ?Risk to Self:   Denies ?Risk to Others:   Yes ?Prior Inpatient Therapy:    ?Prior Outpatient Therapy:  Noncompliant ? ?Past Medical History:  ?Past Medical History:  ?Diagnosis Date  ? Asthma   ? Bipolar 1 disorder ([place])   ? CHF (congestive heart failure) ([place])   ? COPD (chronic obstructive pulmonary disease) ([place])   ? Depression   ? Diabetes mellitus without complication ([place])   ? GERD (gastroesophageal reflux disease)   ? Homelessness   ? Hypertension   ? QT prolongation   ? Schizoaffective disorder ([place])   ?  ?Past Surgical History:  ?Procedure Laterality Date  ? 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 Procedure: AMPUTATION TOE;  Surgeon: Evelina Bucy, DPM;  Location: [facility];  Service: Podiatry;  Laterality: Bilateral;  Ok to leave patient on hospital bed  ? IR ANGIO INTRA EXTRACRAN SEL COM CAROTID INNOMINATE BILAT MOD SED  12/17/2020  ? IR ANGIO VERTEBRAL SEL VERTEBRAL BILAT MOD SED  12/17/2020  ? RADIOLOGY WITH ANESTHESIA N/A 12/17/2020  ? Procedure: IR WITH ANESTHESIA;  Surgeon: Radiologist, Medication, MD;  Location: [place];  Service: Radiology;  Laterality: N/A;  ? ?Family History:  ?Family History  ?Problem Relation Age of Onset  ? Drug abuse Mother   ? Suicidality Mother   ? ?Family Psychiatric  History: reported mom committed suicide and died but did not say what she did. ?Social History:  ?Social History  ? ?Substance and Sexual Activity  ?Alcohol Use Yes  ? Comment: infrequently  ?   ?Social History  ? ?Substance and Sexual Activity  ?Drug Use Yes  ? Types: Cocaine, Marijuana, "Crack" cocaine  ?  ?Social History  ? ?Socioeconomic History  ? Marital status: Single  ?  Spouse name: Not on file  ? Number of children: 4  ? Years of education: Not on file  ? Highest education level: Not on file  ?Occupational History  ? Occupation: N/A  ?Tobacco Use  ? Smoking status: Every Day  ?  Years: 30.00  ?  Types: Cigarettes  ? Smokeless tobacco: Never  ?Vaping Use  ? Vaping Use: Never used  ?Substance and Sexual Activity  ? Alcohol  use: Yes  ?  Comment: infrequently  ? Drug use: Yes  ?  Types: Cocaine, Marijuana, "Crack" cocaine  ? Sexual activity: Not Currently  ?  Partners: Female  ?  Birth control/protection: None  ?Other Topics Concern  ? Not on file  ?Social History Narrative  ? ** Merged History Encounter **  ?    ? ?Social Determinants of Health  ? ?Financial Resource Strain: Not on file  ?Food Insecurity: Not on file  ?Transportation Needs: Not on file  ?Physical Activity: Not on file  ?Stress: Not on file  ?Social Connections: Not on file  ? ?Additional Social History: ?  ? ?Allergies:   ?Allergies  ?Allergen Reactions  ? Ibuprofen Anaphylaxis  ? Acetaminophen Hives, Nausea And Vomiting and Rash  ?  Pt overdosed on bottle of APAP 05/06/16 ?  ? Lyrica Cr [Pregabalin Er] Other (See Comments)  ?  Dystonia and dysarthria  ? Peanut-Containing Drug Products Other (See Comments)  ?  Pt denies allergy to peanut butter 03/02/2021  ? ? ?Labs:  ?  Results for orders placed or performed during the hospital encounter of 04/03/22 (from the past 48 hour(s))  ?Glucose, capillary     Status: Abnormal  ? Collection Time: 04/05/22  5:17 PM  ?Result Value Ref Range  ? Glucose-Capillary 118 (H) 70 - 99 mg/dL  ?  Comment: Glucose reference range applies only to samples taken after fasting for at least 8 hours.  ?Glucose, capillary     Status: Abnormal  ? Collection Time: 04/05/22  9:26 PM  ?Result Value Ref Range  ? Glucose-Capillary 281 (H) 70 - 99 mg/dL  ?  Comment: Glucose reference range applies only to samples taken after fasting for at least 8 hours.  ?Glucose, capillary     Status: Abnormal  ? Collection Time: 04/06/22  7:28 AM  ?Result Value Ref Range  ? Glucose-Capillary 148 (H) 70 - 99 mg/dL  ?  Comment: Glucose reference range applies only to samples taken after fasting for at least 8 hours.  ? Comment 1 Notify RN   ? Comment 2 Document in Chart   ?CBC with Differential     Status: Abnormal  ? Collection Time: 04/06/22  7:30 AM  ?Result Value Ref  Range  ? WBC 6.9 4.0 - 10.5 K/uL  ? RBC 3.70 (L) 4.22 - 5.81 MIL/uL  ? Hemoglobin 11.5 (L) 13.0 - 17.0 g/dL  ? HCT 34.8 (L) 39.0 - 52.0 %  ? MCV 94.1 80.0 - 100.0 fL  ? MCH 31.1 26.0 - 34.0 pg  ? MCHC 33.

## 2022-04-07 NOTE — Progress Notes (Signed)
?      ? ? ? [facility] ?                                                                           ? ? ?[name], is a 56 y.o. male, DOB - [birth year], BWG:[id number] ?Admit date - 04/03/2022    ?Outpatient Primary MD for the patient is [name], [name], NP ? ?LOS - 3  days ? ?Chief Complaint  ?Patient presents with  ? Leg Pain  ?    ? ?Brief summary  ? ?Patient is a 56 year old male with history of asthma, bipolar, depression, schizoaffective disorder, chronic systolic and diastolic CHF, homelessness, GERD, hypertension, acute depigmentation, diabetes mellitus type 2, alcohol use, cocaine and cannabis use, tobacco use, presented to ED with complaints of leg pain and suicidal ideation.  Patient then became hypotensive and developed lactic acidosis.  He smokes 1 to 2 packs of cigarettes daily.  Denied any fever chills, decreased appetite, nausea, vomiting or any diarrhea.  No abdominal pain, chest pain. ? ?In ED, temp 98 ?F, pulse 102, respirations 20, BP initially 130/102, O2 sats 100% on room air.  Initially patient was supposed to be admitted to behavioral care, was seen by psychiatry and wound care.  However in ED, he became hypotensive with BP 76/62 and developed lactic acidosis.   ?He was admitted due to possible sepsis from the lower extremity wounds ? ?Assessment & Plan  ? ? ?Principal Problem: ?  Sepsis due to cellulitis [facility]), lower extremity wounds ?-Patient met sepsis criteria due to hypotension, leukocytosis, lactic acidosis, source likely due to lower extremity cellulitis and wounds ?-MRI of the left ankle on 4/25 had shown no osteomyelitis or osteonecrosis. ?-MRI of the left foot on 4/25 had shown possible osteomyelitis of the second digit, edema of the plantar aspect of the fourth and fifth metatarsal heads, no abscess ?-MRI right foot on 4/23 showed postsurgical changes, mildly heterogenous signal of the proximal phalanx of second digit and proximal phalanx of fifth digit, early  osteomyelitis cannot be excluded.  No abscess ?--ABI in 3/23 were normal.  Venous Doppler ultrasound on 4/30, negative for DVT ?-Patient was admitted 4/23-5/1, previous MD discussed with Dr. Sharol Given who reviewed the MRI of the both feet and at that time, did not feel patient needed any surgical intervention as no osteomyelitis, was recommended outpatient follow-up in 1 week  ?- Patient has not followed up, discussed with Dr. Sharol Given today, will see patient for further recommendations ?-Continue IV vancomycin and Rocephin ? ? ?Active Problems: ?DVT brachial vein, right ?-Noted during the previous admission, this was discussed with vascular surgery, Dr. Quentin Cornwall, had recommended anticoagulation for 3 months ?-Continue eliquis ? ?Chronic combined systolic and diastolic CHF ?-2D echo 1/77 had shown EF of less than 20% with grade 3 DD ?-Noncompliant, was hypotensive in ED, hold hydralazine, Imdur, Aldactone, gradually resume ?-Cardiology had not recommended Entresto due to nonadherence during previous admission ?-Negative balance of 342 cc, continue Lasix, Coreg  ? ? ?COPD ?-Currently stable, no acute wheezing ? ?History of major depressive disorder, schizoaffective disorder, bipolar ?-Continue sitter ?-Patient was IVC'ed on admission ?-Psychiatry following ? ?History of prior CVA ?-Has residual dysarthria ?-Continue statin, eliquis ? ?  History of cocaine use ?-Counseled ? ?History of seizures ?-Continue Keppra ? ? ?Diabetes mellitus, type II, patient is not taking any medications at home ?-States that his CBGs run " good" at home and eats what ever diet he wants to. ?-Patient is adamant on having regular diet.  ?- Will place on sliding scale insulin ?- Previous A1c 7.0 on 03/24/2022 ? ? ?Homelessness ?-TOC consulted ? ? ?Code Status: Full CODE STATUS ?DVT Prophylaxis:   ?apixaban (ELIQUIS) tablet 10 mg  ?apixaban (ELIQUIS) tablet 5 mg  ? ?Level of Care: Level of care: Telemetry ?Family Communication: Updated  patient ? ? ?Disposition Plan:      Remains inpatient appropriate: Ongoing management of lower extremity wounds and cellulitis, psychiatry following for suicidal ideation.  Consulted Dr. Sharol Given. ? ? ?Procedures:  ?None ? ?Consultants:   ?Orthopedics ?Psychiatry ? ?Antimicrobials:  ? ?Anti-infectives (From admission, onward)  ? ? Start     Dose/Rate Route Frequency Ordered Stop  ? 04/05/22 1200  cefTRIAXone (ROCEPHIN) 1 g in sodium chloride 0.9 % 100 mL IVPB       ? 1 g ?200 mL/hr over 30 Minutes Intravenous Every 24 hours 04/04/22 1241    ? 04/04/22 2000  vancomycin (VANCOCIN) IVPB 1000 mg/200 mL premix       ? 1,000 mg ?200 mL/hr over 60 Minutes Intravenous 2 times daily 04/04/22 1227    ? 04/04/22 1145  vancomycin (VANCOCIN) IVPB 1000 mg/200 mL premix       ? 1,000 mg ?200 mL/hr over 60 Minutes Intravenous  Once 04/04/22 1141 04/04/22 1413  ? 04/04/22 1145  cefTRIAXone (ROCEPHIN) 2 g in sodium chloride 0.9 % 100 mL IVPB       ? 2 g ?200 mL/hr over 30 Minutes Intravenous  Once 04/04/22 1141 04/04/22 1257  ? 04/03/22 2200  doxycycline (VIBRA-TABS) tablet 100 mg  Status:  Discontinued       ? 100 mg Oral Every 12 hours 04/03/22 2128 04/04/22 1227  ? ?  ? ? ? ? ? ?Medications ? apixaban  10 mg Oral BID  ? Followed by  ? [START ON 04/12/2022] apixaban  5 mg Oral BID  ? ARIPiprazole  5 mg Oral Daily  ? atorvastatin  40 mg Oral Daily  ? carvedilol  3.125 mg Oral BID WC  ? feeding supplement  237 mL Oral BID BM  ? folic acid  1 mg Oral Daily  ? furosemide  40 mg Oral BID  ? gabapentin  400 mg Oral TID  ? insulin aspart  0-15 Units Subcutaneous TID WC  ? levETIRAcetam  500 mg Oral BID  ? multivitamin with minerals  1 tablet Oral Daily  ? nutrition supplement (JUVEN)  1 packet Oral BID BM  ? thiamine  100 mg Oral Daily  ? umeclidinium bromide  1 puff Inhalation Daily  ? ? ? ? ?Subjective:  ? ?[name] was seen and examined today.  Somewhat frustrated and agitated. ? ?Objective:  ? ?Vitals:  ? 04/06/22 2120 04/06/22 2127  04/07/22 0505 04/07/22 0743  ?BP:  120/68 (!) 137/96   ?Pulse:  90 (!) 104   ?Resp:  19 18   ?Temp:  98.5 ?F (36.9 ?C) 98.2 ?F (36.8 ?C)   ?TempSrc:  Oral Oral   ?SpO2: 100% 98% 100% 99%  ?Weight:      ?Height:      ? ? ?Intake/Output Summary (Last 24 hours) at 04/07/2022 1201 ?Last data filed at 04/07/2022 1116 ?Gross per 24  hour  ?Intake 2181.16 ml  ?Output 3250 ml  ?Net -1068.84 ml  ? ? ? ?Wt Readings from Last 3 Encounters:  ?04/05/22 83.1 kg  ?03/31/22 83.1 kg  ?03/20/22 94.1 kg  ? ? ?Physical Exam ?General: Alert and oriented x 3, somewhat agitated ?Cardiovascular: S1 S2 clear, RRR.  ?Respiratory: CTAB, no wheezing, ?Gastrointestinal: Soft, nontender, nondistended, NBS ?Ext: dressing intact ?Neuro: moving all 4 extremities spontaneously ?Skin: Dressing intact ?Psych: agitated ? ?Right LE ? ? ? ? ?Left leg ? ? ?Data Reviewed:  I have personally reviewed following labs  ? ? ?CBC ?Lab Results  ?Component Value Date  ? WBC 6.9 04/06/2022  ? RBC 3.70 (L) 04/06/2022  ? HGB 11.5 (L) 04/06/2022  ? HCT 34.8 (L) 04/06/2022  ? MCV 94.1 04/06/2022  ? MCH 31.1 04/06/2022  ? PLT 310 04/06/2022  ? MCHC 33.0 04/06/2022  ? RDW 14.1 04/06/2022  ? LYMPHSABS 2.3 04/06/2022  ? MONOABS 0.8 04/06/2022  ? EOSABS 0.1 04/06/2022  ? BASOSABS 0.1 04/06/2022  ? ? ? ?Last metabolic panel ?Lab Results  ?Component Value Date  ? NA 136 04/06/2022  ? K 3.9 04/06/2022  ? CL 105 04/06/2022  ? CO2 23 04/06/2022  ? BUN 14 04/06/2022  ? CREATININE 0.94 04/07/2022  ? GLUCOSE 115 (H) 04/06/2022  ? GFRNONAA >60 04/07/2022  ? GFRAA >60 08/21/2020  ? CALCIUM 8.1 (L) 04/06/2022  ? PHOS 2.9 03/26/2022  ? PROT 6.4 (L) 04/05/2022  ? ALBUMIN 2.5 (L) 04/05/2022  ? BILITOT 0.9 04/05/2022  ? ALKPHOS 56 04/05/2022  ? AST 13 (L) 04/05/2022  ? ALT 12 04/05/2022  ? ANIONGAP 8 04/06/2022  ? ? ?CBG (last 3)  ?Recent Labs  ?  04/06/22 ?2035 04/07/22 ?0752 04/07/22 ?1100  ?GLUCAP 151* 105* 161*  ?  ? ? ? ? ? ? ?Estill Cotta M.D. ?[facility] ?04/07/2022, 12:01  PM ? ?Available via Epic secure chat 7am-7pm ?After 7 pm, please refer to night coverage provider listed on amion. ? ? ? ?

## 2022-04-07 NOTE — Progress Notes (Signed)
Patient loss IV access. Antibiotics paused and IV team consult placed.  ?

## 2022-04-07 NOTE — Progress Notes (Signed)
Pt refused bedtime glucose check.  ?

## 2022-04-08 DIAGNOSIS — I82621 Acute embolism and thrombosis of deep veins of right upper extremity: Secondary | ICD-10-CM | POA: Diagnosis not present

## 2022-04-08 DIAGNOSIS — L0390 Cellulitis, unspecified: Secondary | ICD-10-CM | POA: Diagnosis not present

## 2022-04-08 DIAGNOSIS — I87333 Chronic venous hypertension (idiopathic) with ulcer and inflammation of bilateral lower extremity: Secondary | ICD-10-CM

## 2022-04-08 DIAGNOSIS — G894 Chronic pain syndrome: Secondary | ICD-10-CM | POA: Diagnosis not present

## 2022-04-08 DIAGNOSIS — L03115 Cellulitis of right lower limb: Secondary | ICD-10-CM | POA: Diagnosis not present

## 2022-04-08 LAB — BASIC METABOLIC PANEL
Anion gap: 10 (ref 5–15)
BUN: 12 mg/dL (ref 6–20)
CO2: 25 mmol/L (ref 22–32)
Calcium: 8.4 mg/dL — ABNORMAL LOW (ref 8.9–10.3)
Chloride: 100 mmol/L (ref 98–111)
Creatinine, Ser: 0.84 mg/dL (ref 0.61–1.24)
GFR, Estimated: >60 mL/min (ref >60)
Glucose, Bld: 136 mg/dL — ABNORMAL HIGH (ref 70–99)
Potassium: 3.6 mmol/L (ref 3.5–5.1)
Sodium: 135 mmol/L (ref 135–145)

## 2022-04-08 LAB — CBC
HCT: 37.8 % — ABNORMAL LOW (ref 39.0–52.0)
Hemoglobin: 12.5 g/dL — ABNORMAL LOW (ref 13.0–17.0)
MCH: 30.6 pg (ref 26.0–34.0)
MCHC: 33.1 g/dL (ref 30.0–36.0)
MCV: 92.4 fL (ref 80.0–100.0)
Platelets: 324 10*3/uL (ref 150–400)
RBC: 4.09 MIL/uL — ABNORMAL LOW (ref 4.22–5.81)
RDW: 13.9 % (ref 11.5–15.5)
WBC: 5.7 10*3/uL (ref 4.0–10.5)
nRBC: 0 % (ref 0.0–0.2)

## 2022-04-08 LAB — GLUCOSE, CAPILLARY
Glucose-Capillary: 172 mg/dL — ABNORMAL HIGH (ref 70–99)
Glucose-Capillary: 113 mg/dL — ABNORMAL HIGH (ref 70–99)
Glucose-Capillary: 131 mg/dL — ABNORMAL HIGH (ref 70–99)
Glucose-Capillary: 164 mg/dL — ABNORMAL HIGH (ref 70–99)

## 2022-04-08 NOTE — Consult Note (Signed)
WOC Nurse Consult Note: ?Patient receiving care in [id number].  ?Reason for Consult: "LE wounds, non operative per Dr Lajoyce Corners. Needs wound care" ?Wound type: chronic, non-healing venous stasis ulcers in a homeless patient needing compression and unable to follow-up with health care. ?Pressure Injury POA: Yes/No/NA ?Measurement: ?Wound bed: see photos ?Drainage (amount, consistency, odor)  ?Periwound: ?Dressing procedure/placement/frequency: ?I have reviewed my note and orders placed on 04/04/22 and the orders for conservative treatment to the ulcers remain on record and active.  The wound care is provided by the bedside nurse. ? ? WOC nurse will not follow at this time.  Please re-consult the WOC team if needed. ? ?Helmut Muster, RN, MSN, CWOCN, CNS-BC, pager [phone]  ?  ?

## 2022-04-08 NOTE — Progress Notes (Addendum)
?      ? ? ? [facility] ?                                                                           ? ? ?[name], is a 55 y.o. male, DOB - 02/01/1966, MRN:[id number] ?Admit date - 04/03/2022    ?Outpatient Primary MD for the patient is [name], Mary Ann, NP ? ?LOS - 4  days ? ?Chief Complaint  ?Patient presents with  ? Leg Pain  ?    ? ?Brief summary  ? ?Patient is a 55-year-old male with history of asthma, bipolar, depression, schizoaffective disorder, chronic systolic and diastolic CHF, homelessness, GERD, hypertension, acute depigmentation, diabetes mellitus type 2, alcohol use, cocaine and cannabis use, tobacco use, presented to ED with complaints of leg pain and suicidal ideation.  Patient then became hypotensive and developed lactic acidosis.  He smokes 1 to 2 packs of cigarettes daily.  Denied any fever chills, decreased appetite, nausea, vomiting or any diarrhea.  No abdominal pain, chest pain. ? ?In ED, temp 98 ?F, pulse 102, respirations 20, BP initially 130/102, O2 sats 100% on room air.  Initially patient was supposed to be admitted to behavioral care, was seen by psychiatry and wound care.  However in ED, he became hypotensive with BP 76/62 and developed lactic acidosis.   ?He was admitted due to possible sepsis from the lower extremity wounds ? ?Assessment & Plan  ? ? ?Principal Problem: ?Severe sepsis due to cellulitis (HCC), lower extremity wounds ?-Patient met severe sepsis criteria due to hypotension, leukocytosis, lactic acidosis, source likely due to lower extremity cellulitis and wounds ?-MRI of the left ankle on 4/25 had shown no osteomyelitis or osteonecrosis. ?-MRI of the left foot on 4/25 had shown possible osteomyelitis of the second digit, edema of the plantar aspect of the fourth and fifth metatarsal heads, no abscess ?-MRI right foot on 4/23 showed postsurgical changes, mildly heterogenous signal of the proximal phalanx of second digit and proximal phalanx of fifth digit, early  osteomyelitis cannot be excluded.  No abscess ?--ABI in 3/23 were normal.  Venous Doppler ultrasound on 4/30, negative for DVT ?-Appreciated Dr. Duda's recommendations.  Recommended aggressive wound care, compression dressings to both LE.  No surgical intervention. ?-Patient is currently frustrated and agitated, feels that no one is helping him.  Unfortunate situation as he is homeless, refuses shelter, does not go to wound care clinic, has no transportation. ?-PT evaluation, he will benefit from aggressive wound care at a facility, unclear if he will qualify ? ? ?Active Problems: ?DVT brachial vein, right ?-Noted during the previous admission, this was discussed with vascular surgery, Dr. Robinson, had recommended anticoagulation for 3 months ?-Continue eliquis ? ?Chronic combined systolic and diastolic CHF ?-2D echo 3/23 had shown EF of less than 20% with grade 3 DD ?-Noncompliant, was hypotensive in ED, hold hydralazine, Imdur, Aldactone, gradually resume ?-Cardiology had not recommended Entresto due to nonadherence during previous admission ?-Negative balance of 1.4 L, continue Lasix, Coreg  ? ?COPD ?-Currently stable, no acute wheezing ? ?History of major depressive disorder, schizoaffective disorder, bipolar ?-Patient was IVC'ed on admission ?-Psychiatry was consulted, recommended medical management.  Signed off as patient is   not a risk of self-harm or harm to others.  Sitter discontinued. ?-Continue Abilify ? ?History of prior CVA ?-Has residual dysarthria ?-Continue statin, eliquis ? ?History of cocaine use ?-Counseled ? ?History of seizures ?-Continue Keppra ? ? ?Diabetes mellitus, type II, patient is not taking any medications at home ?-States that his CBGs run " good" at home and eats what ever diet he wants to. ?-Patient is adamant on having regular diet.  ?-Continue sliding scale insulin ?- Previous A1c 7.0 on 03/24/2022 ? ? ?Homelessness ?-TOC consulted ? ? ?Code Status: Full CODE STATUS ?DVT  Prophylaxis:   ?apixaban (ELIQUIS) tablet 10 mg  ?apixaban (ELIQUIS) tablet 5 mg  ? ?Level of Care: Level of care: Telemetry ?Family Communication: Updated patient ? ? ?Disposition Plan:      Remains inpatient appropriate: Ongoing management of lower extremity wounds and cellulitis ? ? ?Procedures:  ?None ? ?Consultants:   ?Orthopedics ?Psychiatry ? ?Antimicrobials:  ? ?Anti-infectives (From admission, onward)  ? ? Start     Dose/Rate Route Frequency Ordered Stop  ? 04/05/22 1200  cefTRIAXone (ROCEPHIN) 1 g in sodium chloride 0.9 % 100 mL IVPB       ? 1 g ?200 mL/hr over 30 Minutes Intravenous Every 24 hours 04/04/22 1241    ? 04/04/22 2000  vancomycin (VANCOCIN) IVPB 1000 mg/200 mL premix       ? 1,000 mg ?200 mL/hr over 60 Minutes Intravenous 2 times daily 04/04/22 1227    ? 04/04/22 1145  vancomycin (VANCOCIN) IVPB 1000 mg/200 mL premix       ? 1,000 mg ?200 mL/hr over 60 Minutes Intravenous  Once 04/04/22 1141 04/04/22 1413  ? 04/04/22 1145  cefTRIAXone (ROCEPHIN) 2 g in sodium chloride 0.9 % 100 mL IVPB       ? 2 g ?200 mL/hr over 30 Minutes Intravenous  Once 04/04/22 1141 04/04/22 1257  ? 04/03/22 2200  doxycycline (VIBRA-TABS) tablet 100 mg  Status:  Discontinued       ? 100 mg Oral Every 12 hours 04/03/22 2128 04/04/22 1227  ? ?  ? ? ? ? ? ?Medications ? apixaban  10 mg Oral BID  ? Followed by  ? [START ON 04/12/2022] apixaban  5 mg Oral BID  ? ARIPiprazole  5 mg Oral Daily  ? atorvastatin  40 mg Oral Daily  ? carvedilol  3.125 mg Oral BID WC  ? feeding supplement  237 mL Oral BID BM  ? folic acid  1 mg Oral Daily  ? furosemide  40 mg Oral BID  ? gabapentin  400 mg Oral TID  ? insulin aspart  0-15 Units Subcutaneous TID WC  ? levETIRAcetam  500 mg Oral BID  ? multivitamin with minerals  1 tablet Oral Daily  ? nutrition supplement (JUVEN)  1 packet Oral BID BM  ? thiamine  100 mg Oral Daily  ? umeclidinium bromide  1 puff Inhalation Daily  ? ? ? ? ?Subjective:  ? ?[name] was seen and examined today.   Very frustrated and agitated.  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  Feels that he needs to have surgery done or amputate his leg ?Explained to him Dr. Jess Barters recommendations and patient is not agreeable with the plan.  ?Objective:  ? ?Vitals:  ? 04/08/22 0549 04/08/22 0713 04/08/22 0728 04/08/22 1403  ?BP: (!) 153/122 (!) 127/98  118/81  ?Pulse: 86 95  88  ?Resp: _0 ?  Temp: 98.5 ?F (36.9 ?C) 98.1 ?F (36.7 ?C)  98.3 ?F (36.8 ?C)  ?TempSrc: Oral Oral  Oral  ?SpO2: 100% 98% 98% 94%  ?Weight:      ?Height:      ? ? ?Intake/Output Summary (Last 24 hours) at 04/08/2022 1455 ?Last data filed at 04/08/2022 1401 ?Gross per 24 hour  ?Intake 1080 ml  ?Output 2000 ml  ?Net -920 ml  ? ? ? ?Wt Readings from Last 3 Encounters:  ?04/05/22 83.1 kg  ?03/31/22 83.1 kg  ?03/20/22 94.1 kg  ? ?Physical Exam ?General: Alert and oriented x 3, agitated and mild ?Cardiovascular: S1 S2 clear, RRR.  ?Respiratory: ?Gastrointestinal:  ?Ext: dressing intact ?Neuro: moving all 4 extremities ?Skin: Dressing intact ?Psych: agitated ? ?Right LE ? ? ? ? ?Left leg ? ? ?Data Reviewed:  I have personally reviewed following labs  ? ? ?CBC ?Lab Results  ?Component Value Date  ? WBC 5.7 04/08/2022  ? RBC 4.09 (L) 04/08/2022  ? HGB 12.5 (L) 04/08/2022  ? HCT 37.8 (L) 04/08/2022  ? MCV 92.4 04/08/2022  ? MCH 30.6 04/08/2022  ? PLT 324 04/08/2022  ? MCHC 33.1 04/08/2022  ? RDW 13.9 04/08/2022  ? LYMPHSABS 2.3 04/06/2022  ? MONOABS 0.8 04/06/2022  ? EOSABS 0.1 04/06/2022  ? BASOSABS 0.1 04/06/2022  ? ? ? ?Last metabolic panel ?Lab Results  ?Component Value Date  ? NA 135 04/08/2022  ? K 3.6 04/08/2022  ? CL 100 04/08/2022  ? CO2 25 04/08/2022  ? BUN 12 04/08/2022  ? CREATININE 0.84 04/08/2022  ? GLUCOSE 136 (H) 04/08/2022  ? GFRNONAA >60 04/08/2022  ? GFRAA >60 08/21/2020  ? CALCIUM 8.4 (L) 04/08/2022  ? PHOS 2.9 03/26/2022  ? PROT 6.4 (L) 04/05/2022  ? ALBUMIN 2.5 (L) 04/05/2022  ? BILITOT 0.9 04/05/2022  ? ALKPHOS 56  04/05/2022  ? AST 13 (L) 04/05/2022  ? ALT 12 04/05/2022  ? ANIONGAP 10 04/08/2022  ? ? ?CBG (last 3)  ?Recent Labs  ?  04/07/22 ?2149 04/08/22 ?0832 04/08/22 ?1203  ?GLUCAP 186* 172* 113*  ?  ? ? ? ? ? ? ?[place]

## 2022-04-08 NOTE — TOC Initial Note (Signed)
Transition of Care (TOC) - Initial/Assessment Note  ? ? ?Patient Details  ?Name: [name] ?MRN: [id number] ?Date of Birth: [birth year] ? ?Transition of Care (TOC) CM/SW Contact:    ?Troye Hiemstra, Meriam Sprague, RN ?Phone Number: ?04/08/2022, 1:44 PM ? ?Clinical Narrative:                 ?  ?Patient is well know to [facility] team.  Patient is homeless, has history of substance abuse, and has history of noncompliance on medications.  Patient has been worked up for a SNF in the past, but they refuse to accept him. Patient is given substance abuse resources everytime but he does not follow up. Patient does sometimes follow up with psych outpatient, but most time he does not. Patient has a wheelchair, and has had a walker in the past. Patient is not eligible for any other equipment. Pt is not currently IVCd. ? ?Expected Discharge Plan: Home/Self Care ?Barriers to Discharge: Continued Medical Work up ? ?Expected Discharge Plan and Services ?Expected Discharge Plan: Home/Self Care ?  ?  ?Activities of Daily Living ?Home Assistive Devices/Equipment: Wheelchair ?ADL Screening (condition at time of admission) ?Patient's cognitive ability adequate to safely complete daily activities?: Yes ?Is the patient deaf or have difficulty hearing?: No ?Does the patient have difficulty seeing, even when wearing glasses/contacts?: No ?Does the patient have difficulty concentrating, remembering, or making decisions?: Yes ?Patient able to express need for assistance with ADLs?: No ?Does the patient have difficulty dressing or bathing?: Yes ?Independently performs ADLs?: Yes (appropriate for developmental age) ?Does the patient have difficulty walking or climbing stairs?: Yes ?Weakness of Legs: Both ?Weakness of Arms/Hands: Both ? ?  ? ?Admission diagnosis:  Suicidal ideation [R45.851] ?Chronic pain syndrome [G89.4] ?Polysubstance abuse (HCC) [F19.10] ?Cellulitis of right lower extremity [L03.115] ?Sepsis due to cellulitis (HCC) [L03.90, A41.9] ?Patient  Active Problem List  ? Diagnosis Date Noted  ? Chronic venous hypertension (idiopathic) with ulcer and inflammation of bilateral lower extremity (HCC)   ? Sepsis due to cellulitis (HCC) 04/04/2022  ? Normocytic anemia 04/04/2022  ? Acute deep vein thrombosis (DVT) of brachial vein (HCC) 03/31/2022  ? Swelling of lower extremity 03/23/2022  ? SIRS (systemic inflammatory response syndrome) (HCC) 03/23/2022  ? Leg swelling 03/23/2022  ? Acute on chronic systolic heart failure (HCC) 02/26/2022  ? Seizure (HCC) 02/26/2022  ? Venous stasis ulcer of right ankle limited to breakdown of skin with varicose veins (HCC)   ? Hypertensive urgency 01/29/2022  ? Prolonged QT interval 01/29/2022  ? Alcohol abuse 01/29/2022  ? chronic ulcers 01/29/2022  ? Wound infection 12/04/2021  ? Open wounds involving multiple regions of lower extremity 12/03/2021  ? Mixed diabetic hyperlipidemia associated with type 2 diabetes mellitus (HCC) 12/03/2021  ? Cellulitis of right lower extremity 11/13/2021  ? Type 2 diabetes mellitus with diabetic polyneuropathy, without long-term current use of insulin (HCC) 11/13/2021  ? Antisocial personality disorder (HCC) 11/04/2021  ? Malingering 11/04/2021  ? Foot pain 04/23/2021  ? Chronic combined systolic and diastolic congestive heart failure (HCC) 03/02/2021  ? History of stroke 03/02/2021  ? Substance use disorder 03/02/2021  ? Skin ulcer of right great toe with necrosis of bone (HCC)   ? Encounter for assessment of healthcare decision-making capacity   ? Pain, dental   ? Dystonia   ? Dysarthria   ? Shock liver 01/04/2021  ? Acute metabolic encephalopathy 01/04/2021  ? Coronary artery disease involving native coronary artery of native heart without angina pectoris 01/04/2021  ?  Bipolar I disorder (HCC) 01/04/2021  ? Cocaine abuse (HCC) 01/04/2021  ? Homelessness 01/04/2021  ? HLD (hyperlipidemia) 01/04/2021  ? Osteomyelitis of great toe (HCC)   ? Ulcer of great toe, left, with necrosis of bone (HCC)   ?  Ulcer of great toe, right, with necrosis of muscle (HCC)   ? Acute osteomyelitis of toe, left (HCC)   ? Acute on chronic combined systolic and diastolic CHF (congestive heart failure) (HCC) 12/20/2020  ? Respiratory failure (HCC)   ? Ischemic stroke (HCC) 12/17/2020  ? 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 Diabetic foot ulcer (HCC) 12/08/2020  ? CHF (congestive heart failure) (HCC) 11/14/2020  ? CHF exacerbation (HCC) 11/13/2020  ? Hypokalemia   ? Hyperlipidemia   ? Acute respiratory failure with hypoxia (HCC) 10/26/2020  ? COPD with acute exacerbation (HCC) 10/14/2020  ? Acute on chronic combined systolic and diastolic CHF (congestive heart failure) (HCC) 10/02/2020  ? Heart failure with reduced ejection fraction and diastolic dysfunction (HCC) 09/07/2020  ? Near syncope 07/21/2020  ? Acid reflux 06/03/2020  ? Cannabis abuse 03/19/2020  ? Acute bronchitis due to Rhinovirus 09/08/2019  ? Schizoaffective disorder (HCC) 06/24/2019  ? SOB (shortness of breath)   ? QT prolongation 05/25/2019  ? Cocaine abuse (HCC)   ? Elevated troponin   ? Homeless   ? Suicidal ideation   ? Bilateral lower extremity pain 04/05/2019  ? Housing situation unstable 03/28/2019  ? Bilateral lower extremity edema   ? Acute exacerbation of CHF (congestive heart failure) (HCC) 03/04/2019  ? Alcohol withdrawal syndrome, with delirium (HCC) 05/03/2018  ? 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 Mild intermittent asthma without complication 08/28/2017  ? Asthma 12/26/2016  ? Tobacco use disorder 12/26/2016  ? Schizophrenia (HCC) 12/26/2016  ? Severe episode of recurrent major depressive disorder, with psychotic features (HCC) 10/31/2016  ? Cocaine abuse with cocaine-induced mood disorder (HCC) 09/06/2016  ? Alcohol use disorder, mild, abuse 07/31/2016  ? Substance induced mood disorder (HCC) 07/30/2016  ? Schizoaffective disorder, bipolar type (HCC) 05/31/2016  ? Cocaine use disorder, severe, dependence (HCC) 05/31/2016   ? Major depressive disorder, recurrent episode (HCC) 05/30/2016  ? Suicidal behavior with attempted self-injury (HCC) 05/08/2016  ? Acetaminophen overdose 05/06/2016  ? ?PCP:  [name], NP ?Pharmacy:   ?[facility] - [place], [name] - [address] ?[address] ?[name] [address] ?Phone: [phone] Fax: [phone] ? ?[facility] ?[address] ?[name] [address] ?Phone: [phone] Fax: [phone] ? ?Readmission Risk Interventions ? ?  04/08/2022  ?  1:43 PM 03/25/2022  ?  5:39 PM 02/04/2022  ?  4:41 PM  ?Readmission Risk Prevention Plan  ?Transportation Screening Complete Complete Complete  ?Medication Review (RN Care Manager) Complete Referral to Pharmacy Complete  ?PCP or Specialist appointment within 3-5 days of discharge Complete Complete Complete  ?HRI or Home Care Consult Not Complete Not Complete Complete  ?HRI or Home Care Consult Pt Refusal Comments Pt is homeless Patient is homeless   ?SW Recovery Care/Counseling Consult Complete Complete Complete  ?Palliative Care Screening Not Applicable Not Applicable Not Applicable  ?Skilled Nursing Facility Not Applicable Complete Complete  ? ? ? ?

## 2022-04-09 DIAGNOSIS — A419 Sepsis, unspecified organism: Secondary | ICD-10-CM | POA: Diagnosis not present

## 2022-04-09 DIAGNOSIS — L0390 Cellulitis, unspecified: Secondary | ICD-10-CM | POA: Diagnosis not present

## 2022-04-09 LAB — CBC
HCT: 37.8 % — ABNORMAL LOW (ref 39.0–52.0)
Hemoglobin: 12.4 g/dL — ABNORMAL LOW (ref 13.0–17.0)
MCH: 30.2 pg (ref 26.0–34.0)
MCHC: 32.8 g/dL (ref 30.0–36.0)
MCV: 92.2 fL (ref 80.0–100.0)
Platelets: 365 10*3/uL (ref 150–400)
RBC: 4.1 MIL/uL — ABNORMAL LOW (ref 4.22–5.81)
RDW: 13.7 % (ref 11.5–15.5)
WBC: 5.6 10*3/uL (ref 4.0–10.5)
nRBC: 0 % (ref 0.0–0.2)

## 2022-04-09 LAB — CULTURE, BLOOD (ROUTINE X 2)
Culture: NO GROWTH
Culture: NO GROWTH
Special Requests: ADEQUATE
Special Requests: ADEQUATE

## 2022-04-09 LAB — GLUCOSE, CAPILLARY
Glucose-Capillary: 189 mg/dL — ABNORMAL HIGH (ref 70–99)
Glucose-Capillary: 139 mg/dL — ABNORMAL HIGH (ref 70–99)
Glucose-Capillary: 116 mg/dL — ABNORMAL HIGH (ref 70–99)
Glucose-Capillary: 193 mg/dL — ABNORMAL HIGH (ref 70–99)

## 2022-04-09 LAB — BASIC METABOLIC PANEL
Anion gap: 9 (ref 5–15)
BUN: 13 mg/dL (ref 6–20)
CO2: 27 mmol/L (ref 22–32)
Calcium: 8.9 mg/dL (ref 8.9–10.3)
Chloride: 103 mmol/L (ref 98–111)
Creatinine, Ser: 0.94 mg/dL (ref 0.61–1.24)
GFR, Estimated: >60 mL/min (ref >60)
Glucose, Bld: 109 mg/dL — ABNORMAL HIGH (ref 70–99)
Potassium: 3.9 mmol/L (ref 3.5–5.1)
Sodium: 139 mmol/L (ref 135–145)

## 2022-04-09 NOTE — Progress Notes (Signed)
?PROGRESS NOTE ? ? ?[name]  [id number] DOB: 03-28-1966 DOA: 04/03/2022 ?PCP: Marliss Coots, NP  ?Brief Narrative:  ? 56 y/o blk male ?  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 ?5 hospitalizations this year alone for homelessness lower extremity wounds COPD exacerbations ? ?At last admit 4/23 through 5/1 had lower extremity bilateral wounds MRI 4/23 showed question osteomyelitis-Dr. Sharol Given was consulted he did not feel there was osteo patient was kept on oxycodone and discharged on gabapentin to follow-up with Dr. Sharol Given ? ?Readmit 5/5 leg pain, suicidal ideation hypotension to the 70s with lactic acidosis ?WBC 15 platelet 383 ?UDS + cocaine + THC ?Lactic acid cycled downwards from 2.7-1.6 ? ?Patient started on vancomycin/Ceftriaxone as well as blood cultures obtained ? ?Psychiatry was consulted, orthopedics was consulted ? ?Hospital-Problem based course ? ?Sepsis from cellulitis of chronic lower extremity wound ?MRI left ankle no osteomyelitis MRI foot however shows possible osteo second digit-ABIs normal on 3/23 ?Dr. Sharol Given does not recommend surgical intervention ?Patient needs aggressive wound care but unclear how as patient is home-difficult disposition planning-will-escalate and ensure CMO aware  peripherally ?Discussed with patient that we cannot give IV pain meds once he leaves-we will de-escalate to orals by a.m. 5/11 ?R Brachial DVT diagnosed last admission ?Patient noncompliant after hospitalization transition to 5 mg dosing on 5/13 ?Combined systolic and diastolic heart failure ?Hypotensive on admission-currently on Coreg 3.125 twice daily Lasix 40 twice daily ?Major depressive disorder due to affective-patient IVC done admission and this has been reversed ?Prior CVA with probably post CVA  neurocognitive issues ?Psychiatry medication management--gabapentin 400 3 times daily Abilify 5 daily ?Manage dysarthria as able-continue Keppra twice daily ?Diabetes mellitus type 2 noncompliant on meds prior A1c 7 ?Patient requested regular diet ? ?Patient is homeless this complicates his care he has need for lower extremity dressings and cannot wear shoes on his feet-physical therapy has elected to buy him some crocs-I am not sure if he will ever have a safe disposition for this patient-he seems to be a high healthcare utilizater secondary to homelessness and polysubstance abuse ? ?  ?Consultants:  ? ? ?Procedures:  ? ?Antimicrobials:   ? ? ?Subjective: ?Quite belligerent at times ?He seems to be on day 7 total of antibiotics ?I think some of his issues are neurocognitive however ? ?He was willing to work with therapy ? ?He does not typically walk-he has a wheelchair but when he was picked up he is not sure where the wheelchair went ? ?Objective: ?Vitals:  ? 04/08/22 0728 04/08/22 1403 04/08/22 2030 04/09/22 0533  ?BP:  118/81 (!) 123/105 (!) 149/92  ?Pulse:  88 91 94  ?Resp:  19 18 18   ?Temp:  98.3 ?F (36.8 ?C) 98.4 ?F (36.9 ?C) 97.8 ?F (36.6 ?C)  ?TempSrc:  Oral Oral Oral  ?SpO2: 98% 94% 98% 100%  ?Weight:      ?Height:      ? ? ?Intake/Output Summary (Last 24 hours) at 04/09/2022 0706 ?Last data filed at 04/09/2022 0500 ?Gross per 24 hour  ?Intake 1800 ml  ?Output 1850 ml  ?Net -50 ml  ? ?Filed Weights  ? 04/05/22 1900  ?Weight: 83.1 kg  ? ? ?Examination: ? ?Coherent no distress chest clear slightly tachycardic no wheeze no rhonchi ? ? ? ? ? ? ?Data Reviewed: personally reviewed  ? ?CBC ?   ?  Component Value Date/Time  ? WBC 5.6 04/09/2022 0525  ? RBC 4.10 (L) 04/09/2022 0525  ? HGB 12.4 (L) 04/09/2022 0525  ? HCT 37.8 (L) 04/09/2022 0525  ? PLT 365 04/09/2022 0525  ? MCV 92.2 04/09/2022 0525  ? MCH 30.2 04/09/2022 0525  ? MCHC 32.8 04/09/2022 0525  ? RDW 13.7 04/09/2022 0525  ? LYMPHSABS 2.3 04/06/2022 0730  ?  MONOABS 0.8 04/06/2022 0730  ? EOSABS 0.1 04/06/2022 0730  ? BASOSABS 0.1 04/06/2022 0730  ? ? ?  Latest Ref Rng & Units 04/09/2022  ?  5:25 AM 04/08/2022  ?  5:23 AM 04/07/2022  ?  4:59 AM  ?CMP  ?Glucose 70 - 99 mg/dL 109   136     ?BUN 6 - 20 mg/dL 13   12     ?Creatinine 0.61 - 1.24 mg/dL 0.94   0.84   0.94    ?Sodium 135 - 145 mmol/L 139   135     ?Potassium 3.5 - 5.1 mmol/L 3.9   3.6     ?Chloride 98 - 111 mmol/L 103   100     ?CO2 22 - 32 mmol/L 27   25     ?Calcium 8.9 - 10.3 mg/dL 8.9   8.4     ? ? ? ?Radiology Studies: ?No results found. ? ? ?Scheduled Meds: ? apixaban  10 mg Oral BID  ? Followed by  ? [START ON 04/12/2022] apixaban  5 mg Oral BID  ? ARIPiprazole  5 mg Oral Daily  ? atorvastatin  40 mg Oral Daily  ? carvedilol  3.125 mg Oral BID WC  ? feeding supplement  237 mL Oral BID BM  ? folic acid  1 mg Oral Daily  ? furosemide  40 mg Oral BID  ? gabapentin  400 mg Oral TID  ? insulin aspart  0-15 Units Subcutaneous TID WC  ? levETIRAcetam  500 mg Oral BID  ? multivitamin with minerals  1 tablet Oral Daily  ? nutrition supplement (JUVEN)  1 packet Oral BID BM  ? thiamine  100 mg Oral Daily  ? umeclidinium bromide  1 puff Inhalation Daily  ? ?Continuous Infusions: ? cefTRIAXone (ROCEPHIN)  IV Stopped (04/08/22 1343)  ? vancomycin Stopped (04/08/22 2330)  ? ? ? LOS: 5 days  ? ?Time spent: 45 ? ?Nita Sells, MD ?[facility] ?To contact the attending provider between 7A-7P or the covering provider during after hours 7P-7A, please log into the web site [email or web address] and access using universal [place] password for that web site. If you do not have the password, please call the hospital operator. ? ?04/09/2022, 7:06 AM  ? ? ?

## 2022-04-09 NOTE — Progress Notes (Signed)
BLE dressings due to be changed, pt reluctant due to pain, pt agreeable if pain can be improved, pt pre-medicated with prn oxycodone, pt educated on PRN fentanyl IV and dose given post dressing change. BLE dressings changed, pt tolerated well, VSS, RA, will continue to monitor  ?

## 2022-04-09 NOTE — Evaluation (Signed)
Occupational Therapy Evaluation Patient Details Name: [name] MRN: [id number] DOB: 22-Jul-1966 Today's Date: 04/09/2022   History of Present Illness Pt is a 56 y.o. male presenting to [facility] ED on 5/5 with bilateral lower extremity pain and swelling as well as SI. Pt code sepsis. Pt brought to hospital for evaluation. Of Note: pt has many recent hospital stays for similar complaints, most recently 4/23 - 5/01. PMH: stroke with residual dysarthria/right sided weakness (wheelchair bound), HFrEF (EF <20%), CAD, T2DM (diet controlled), cocaine abuse, schizoaffective disorder, anemia, COPD, depression, GERD, HTN, Bipolar disorder, smoker, hallux amputation bilaterally.   Clinical Impression   [name] is a 56 year old man well known to therapist. At baseline he is unsheltered and uses a wheelchair for mobility. He is grossly independent with BADLs but reports not wearing footwear because of wounds, bandages and pain. On evaluation he demonstrates near his baseline functional abilities - he has right sided hemiparesis with ataxia of the right upper extremity but good strength bilaterally. He is min assist to sit up via hand hold. He is currently no willing to stand due to foot pain though he reports having ambulated to the bathroom twice with nursing during current hospital stay when he needed to have a BM. He is grossly independent at bed level with setup. He is typically able to transfer to his wheelchair and maneuver in the room and to bathroom with his wheelchair when he is in hospital - but his wheelchair was left in the park. In regards to his functional abilities - he is able to perform as needed from bed level or wheelchair level - with mobility being his limitation - and this mostly from pain. OT will follow acutely to make sure he is able to transfer safely, perform toileting and find some form  of footwear that he can manage with bandages.   Patient would ultimately benefit from long term  care to manage wounds and socioeconomic issues but no facility has been found to take him and will more than likely discharge back to the streets. He will need a wheelchair at discharge - since EMS did not bring his - as he is not able to ambulate functional distances with walker and prolonged ambulation would aggravate current foot wounds.        Recommendations for follow up therapy are one component of a multi-disciplinary discharge planning process, led by the attending physician.  Recommendations may be updated based on patient status, additional functional criteria and insurance authorization.   Follow Up Recommendations  No OT follow up    Assistance Recommended at Discharge None  Patient can return home with the following A little help with walking and/or transfers;A little help with bathing/dressing/bathroom;Direct supervision/assist for financial management;Direct supervision/assist for medications management;Assistance with cooking/housework;Help with stairs or ramp for entrance;Assist for transportation    Functional Status Assessment  Patient has had a recent decline in their functional status and/or demonstrates limited ability to make significant improvements in function in a reasonable and predictable amount of time  Equipment Recommendations  Wheelchair (measurements OT)    Recommendations for Other Services       Precautions / Restrictions Precautions Precautions: Fall Precaution Comments: bil feet and lower leg wounds Restrictions Weight Bearing Restrictions: No      Mobility Bed Mobility Overal bed mobility: Needs Assistance Bed Mobility: Supine to Sit, Sit to Supine     Supine to sit: Min assist Sit to supine: Supervision   General bed mobility comments: Hand  hold to pull himself up in to sitting.    Transfers                   General transfer comment: deferred due to LE wounds/pain      Balance Overall balance assessment: Mild deficits  observed, not formally tested                                         ADL either performed or assessed with clinical judgement   ADL Overall ADL's : Needs assistance/impaired Eating/Feeding: Independent   Grooming: Set up;Sitting   Upper Body Bathing: Set up;Sitting   Lower Body Bathing: Set up;Bed level   Upper Body Dressing : Set up;Sitting   Lower Body Dressing: Set up;Bed level Lower Body Dressing Details (indicate cue type and reason): Unable to don footwear due to pain, wounds and bandages Toilet Transfer: Minimal assistance Toilet Transfer Details (indicate cue type and reason): Reports ambulating to bathroom twice with walker and nursing assistance. Limited how much walking he does due to pain. Reprots he grossly can perform toileting but is difficulty. Toileting- Clothing Manipulation and Hygiene: Min guard;Sitting/lateral lean       Functional mobility during ADLs: Supervision/safety       Vision Patient Visual Report: No change from baseline       Perception     Praxis      Pertinent Vitals/Pain Pain Assessment Pain Assessment: 0-10 Pain Location: LEs/ feet with bed mobility Pain Descriptors / Indicators: Grimacing, Guarding Pain Intervention(s): Limited activity within patient's tolerance     Hand Dominance Right   Extremity/Trunk Assessment Upper Extremity Assessment Upper Extremity Assessment: RUE deficits/detail;LUE deficits/detail RUE Deficits / Details: Good strength but ataxic with intention - grossly functiobal as an assist RUE Coordination: decreased fine motor;decreased gross motor LUE Deficits / Details: WFL ROM, 5/5 strength   Lower Extremity Assessment Lower Extremity Assessment: Defer to PT evaluation RLE Deficits / Details: Pt uses BUE to move BLE off bed, refused assistance. RLE Sensation: history of peripheral neuropathy (increased pain sensation) LLE Deficits / Details: Pt uses BUE to move BLE off bed, refused  assistance. LLE Sensation: WNL;history of peripheral neuropathy   Cervical / Trunk Assessment Cervical / Trunk Assessment: Normal   Communication Communication Communication: Expressive difficulties (dysarthric)   Cognition Arousal/Alertness: Awake/alert Behavior During Therapy: WFL for tasks assessed/performed Overall Cognitive Status: Within Functional Limits for tasks assessed                                 General Comments: Easily agitated, emotional disturbances at baseline     General Comments       Exercises     Shoulder Instructions      Home Living Family/patient expects to be discharged to:: Shelter/Homeless                                 Additional Comments: Pt generally struggles to enunciate but able to be understood      Prior Functioning/Environment Prior Level of Function : Needs assist       Physical Assist : Mobility (physical);ADLs (physical) Mobility (physical): Transfers;Gait ADLs (physical): Bathing;Dressing;Toileting Mobility Comments: Pt uses WC at baseline but reports having difficulty with propulsion as he cannot use his feet secondary  to pain/wounds and using his right UE is becoming more difficult. Pt expresses frustration that he must ask people for assistance for propulsion. Can stand with walker and min assist to ambulate if needed for short distance ADLs Comments: Patient is grossly independent from wheelchair level.        OT Problem List: Pain;Decreased coordination;Impaired UE functional use;Impaired balance (sitting and/or standing)      OT Treatment/Interventions: Self-care/ADL training;Balance training;Patient/family education;Therapeutic activities;DME and/or AE instruction    OT Goals(Current goals can be found in the care plan section) Acute Rehab OT Goals Patient Stated Goal: less pain OT Goal Formulation: With patient Time For Goal Achievement: 04/23/22 Potential to Achieve Goals: Fair  OT  Frequency: Min 2X/week    Co-evaluation PT/OT/SLP Co-Evaluation/Treatment: Yes (co-eval) Reason for Co-Treatment: For patient/therapist safety;Necessary to address cognition/behavior during functional activity PT goals addressed during session: Mobility/safety with mobility OT goals addressed during session: ADL's and self-care      AM-PAC OT "6 Clicks" Daily Activity     Outcome Measure Help from another person eating meals?: None Help from another person taking care of personal grooming?: A Little Help from another person toileting, which includes using toliet, bedpan, or urinal?: A Little Help from another person bathing (including washing, rinsing, drying)?: A Little Help from another person to put on and taking off regular upper body clothing?: A Little Help from another person to put on and taking off regular lower body clothing?: A Little 6 Click Score: 19   End of Session Nurse Communication: Mobility status  Activity Tolerance: Patient limited by pain Patient left: in bed;with nursing/sitter in room  OT Visit Diagnosis: Pain                Time: 3086-5784 OT Time Calculation (min): 28 min Charges:  OT General Charges $OT Visit: 1 Visit OT Evaluation $OT Eval Low Complexity: 1 Low  Kandance Yano, OTR/L Acute Care Rehab Services  Office [phone] Pager: [phone]   Kelli Churn 04/09/2022, 3:48 PM

## 2022-04-09 NOTE — Progress Notes (Signed)
Wound dressing change performed this shift. Writer unable to complete measurements due to patient agitated and refused. PRN Oxycodone administered for BLE pain.  ?

## 2022-04-09 NOTE — Evaluation (Addendum)
Physical Therapy Evaluation ?Patient Details ?Name: [name] ?MRN: [id number] ?DOB: 1966/02/23 ?Today's Date: 04/09/2022 ? ?History of Present Illness ? Pt is a 56 y.o. male presenting to [facility] ED on 5/5 with bilateral lower extremity pain and swelling as well as SI. Pt code sepsis. Pt brought to hospital for evaluation. Of Note: pt has many recent hospital stays for similar complaints, most recently 4/23 - 5/01. PMH: stroke with residual dysarthria/right sided weakness (wheelchair bound), HFrEF (EF <20%), CAD, T2DM (diet controlled), cocaine abuse, schizoaffective disorder, anemia, COPD, depression, GERD, HTN, Bipolar disorder, smoker, hallux amputation bilaterally. ?  ?Clinical Impression ? Pt presenting with the problems listed above and functional impairments below. Pt agreeable to be seen and spends much of the session describing his frustrations with his situation and care; Portion of session with MD present. Pt is WC-bound at baseline but WC is not present in the room and he does not know where his WC is; suggests [place] might [place] in the [place]. Pt min assist for bed mobility for trunk elevation and spent the majority of the session dangling EOB. BLE range of motion and muscle testing deferred secondary to BLE wounds, pt did not have appropriate footwear for standing or ambulation. Pt reporting he could stand with RW and assist but "would pay for it later" with increased pain. Discussed SNF-level therapies to address functional limitations especially wounds and pt is agreeable, current recommendation is SNF. Recommending WC as his is missing and likely will be discharged back to shelter/streets and is not a functional ambulator and is experiencing high level of pain secondary to BLE wounds. We will continue to follow him acutely to promote safe discharge to the destination indicated below.  ? ?   ? ?Recommendations for follow up therapy are one component of a multi-disciplinary discharge  planning process, led by the attending physician.  Recommendations may be updated based on patient status, additional functional criteria and insurance authorization. ? ?Follow Up Recommendations Skilled nursing-short term rehab (<3 hours/day) ? ?  ?Assistance Recommended at Discharge Frequent or constant Supervision/Assistance  ?Patient can return home with the following ? Help with stairs or ramp for entrance;Assist for transportation;Assistance with cooking/housework;Direct supervision/assist for medications management;Two people to help with walking and/or transfers;A lot of help with bathing/dressing/bathroom ? ?  ?Equipment Recommendations Wheelchair  ?Recommendations for Other Services ?    ?  ?Functional Status Assessment Patient has had a recent decline in their functional status and/or demonstrates limited ability to make significant improvements in function in a reasonable and predictable amount of time  ? ?  ?Precautions / Restrictions Precautions ?Precautions: Fall ?Precaution Comments: bil feet and lower leg wounds ?Restrictions ?Weight Bearing Restrictions: No  ? ?  ? ?Mobility ? Bed Mobility ?Overal bed mobility: Needs Assistance ?Bed Mobility: Supine to Sit, Sit to Supine ?  ?  ?Supine to sit: Min assist ?Sit to supine: Supervision ?  ?General bed mobility comments: Pt min assist to elevate trunk upright. returned to supine with no assistance ?  ? ?Transfers ?  ?  ?  ?  ?  ?  ?  ?  ?  ?General transfer comment: not attempted secondary to BLE wounds and pt reporting high level of pain; "I could stand with help but I will pay for it later." At one point, pt removed BLE dressings to show PT and OT. ?  ? ?Ambulation/Gait ?  ?  ?  ?  ?  ?  ?  ?  General Gait Details: deferred, pt at [facility] level but WC was not brought with him to hospital; pt is not sure where the [facility] is (reporting it is still at the park where he was picked up) ? ?Stairs ?  ?  ?  ?  ?  ? ?Wheelchair Mobility ?  ? ?Modified Rankin (Stroke  Patients Only) ?  ? ?  ? ?Balance Overall balance assessment: Needs assistance ?Sitting-balance support: Feet supported ?Sitting balance-Leahy Scale: Fair ?  ?  ?  ?  ?  ?  ?  ?  ?  ?  ?  ?  ?  ?  ?  ?  ?   ? ? ? ?Pertinent Vitals/Pain Pain Assessment ?Pain Assessment: Faces ?Faces Pain Scale: Hurts even more ?Pain Location: LEs/ feet with bed mobility ?Pain Descriptors / Indicators: Grimacing ?Pain Intervention(s): Limited activity within patient's tolerance, Monitored during session, Repositioned  ? ? ?Home Living Family/patient expects to be discharged to:: Shelter/Homeless ?  ?  ?  ?  ?  ?  ?  ?  ?  ?Additional Comments: Pt generally struggles to enunciate but able to be understood  ?  ?Prior Function Prior Level of Function : Needs assist ?  ?  ?  ?Physical Assist : Mobility (physical);ADLs (physical) ?Mobility (physical): Transfers;Gait ?ADLs (physical): Bathing;Dressing;Toileting ?Mobility Comments: Pt uses WC at baseline but reports having difficulty with propulsion as he cannot use his feet secondary to pain/wounds and using his right UE is becoming more difficult. Pt expresses frustration that he must ask people for assistance for propulsion ?ADLs Comments: Pt reports he needs assistance for dressing and is not able to perform pericare. ?  ? ? ?Hand Dominance  ?   ? ?  ?Extremity/Trunk Assessment  ? Upper Extremity Assessment ?Upper Extremity Assessment: Defer to OT evaluation ?  ? ?Lower Extremity Assessment ?Lower Extremity Assessment: Generalized weakness;RLE deficits/detail;LLE deficits/detail ?RLE Deficits / Details: Pt uses BUE to move BLE off bed, refused assistance. ?RLE Sensation: history of peripheral neuropathy (increased pain sensation) ?LLE Deficits / Details: Pt uses BUE to move BLE off bed, refused assistance. ?LLE Sensation: WNL;history of peripheral neuropathy ?  ? ?Cervical / Trunk Assessment ?Cervical / Trunk Assessment: Normal  ?Communication  ? Communication: Expressive difficulties  (Speech is garbled and slurred but intelligible)  ?Cognition Arousal/Alertness: Awake/alert ?Behavior During Therapy: [facility] for tasks assessed/performed ?Overall Cognitive Status: No family/caregiver present to determine baseline cognitive functioning ?Area of Impairment: Attention, Following commands, Safety/judgement, Awareness, Problem solving, Memory ?  ?  ?  ?  ?  ?  ?  ?  ?  ?Current Attention Level: Focused ?Memory: Decreased recall of precautions, Decreased short-term memory ?Following Commands: Follows one step commands inconsistently ?Safety/Judgement: Decreased awareness of safety, Decreased awareness of deficits ?Awareness: Intellectual ?Problem Solving: Difficulty sequencing, Requires verbal cues, Decreased initiation ?  ?  ?  ? ?  ?General Comments   ? ?  ?Exercises    ? ?Assessment/Plan  ?  ?PT Assessment Patient needs continued PT services  ?PT Problem List Decreased strength;Decreased mobility;Decreased range of motion;Decreased coordination;Decreased activity tolerance;Decreased skin integrity;Pain;Decreased knowledge of use of DME ? ?   ?  ?PT Treatment Interventions Functional mobility training;Therapeutic activities;Therapeutic exercise;Balance training;Neuromuscular re-education;Patient/family education;DME instruction;Wheelchair mobility training;Cognitive remediation   ? ?PT Goals (Current goals can be found in the Care Plan section)  ?Acute Rehab PT Goals ?Patient Stated Goal: decrease pain ?PT Goal Formulation: With patient ?Time For Goal Achievement: 04/23/22 ?Potential to Achieve Goals: Fair ? ?  ?  Frequency Min 2X/week ?  ? ? ?Co-evaluation PT/OT/SLP Co-Evaluation/Treatment: Yes ?Reason for Co-Treatment: Necessary to address cognition/behavior during functional activity ?PT goals addressed during session: Mobility/safety with mobility ?OT goals addressed during session: ADL's and self-care ?  ? ? ?  ?AM-PAC PT "6 Clicks" Mobility  ?Outcome Measure Help needed turning from your back to your  side while in a flat bed without using bedrails?: A Little ?Help needed moving from lying on your back to sitting on the side of a flat bed without using bedrails?: A Little ?Help needed moving to and from a bed

## 2022-04-10 LAB — GLUCOSE, CAPILLARY
Glucose-Capillary: 146 mg/dL — ABNORMAL HIGH (ref 70–99)
Glucose-Capillary: 116 mg/dL — ABNORMAL HIGH (ref 70–99)

## 2022-04-10 LAB — CBC
HCT: 38.9 % — ABNORMAL LOW (ref 39.0–52.0)
Hemoglobin: 12.6 g/dL — ABNORMAL LOW (ref 13.0–17.0)
MCH: 29.7 pg (ref 26.0–34.0)
MCHC: 32.4 g/dL (ref 30.0–36.0)
MCV: 91.7 fL (ref 80.0–100.0)
Platelets: 403 10*3/uL — ABNORMAL HIGH (ref 150–400)
RBC: 4.24 MIL/uL (ref 4.22–5.81)
RDW: 13.9 % (ref 11.5–15.5)
WBC: 6.3 10*3/uL (ref 4.0–10.5)
nRBC: 0 % (ref 0.0–0.2)

## 2022-04-10 LAB — COMPREHENSIVE METABOLIC PANEL
ALT: 14 U/L (ref 0–44)
AST: 15 U/L (ref 15–41)
Albumin: 2.9 g/dL — ABNORMAL LOW (ref 3.5–5.0)
Alkaline Phosphatase: 61 U/L (ref 38–126)
Anion gap: 11 (ref 5–15)
BUN: 13 mg/dL (ref 6–20)
CO2: 25 mmol/L (ref 22–32)
Calcium: 8.8 mg/dL — ABNORMAL LOW (ref 8.9–10.3)
Chloride: 101 mmol/L (ref 98–111)
Creatinine, Ser: 0.92 mg/dL (ref 0.61–1.24)
GFR, Estimated: >60 mL/min (ref >60)
Glucose, Bld: 125 mg/dL — ABNORMAL HIGH (ref 70–99)
Potassium: 3.8 mmol/L (ref 3.5–5.1)
Sodium: 137 mmol/L (ref 135–145)
Total Bilirubin: 0.6 mg/dL (ref 0.3–1.2)
Total Protein: 7.1 g/dL (ref 6.5–8.1)

## 2022-04-10 MED ORDER — POTASSIUM CHLORIDE CRYS ER 20 MEQ PO TBCR
40.0000 meq | EXTENDED_RELEASE_TABLET | Freq: Once | ORAL | Status: AC
  Administered 2022-04-10: 40 meq via ORAL
  Filled 2022-04-10: qty 2

## 2022-04-10 NOTE — TOC Progression Note (Signed)
Transition of Care (TOC) - Progression Note  ? ? ?Patient Details  ?Name: [name] ?MRN: [id number] ?Date of Birth: 1966-04-13 ? ?Transition of Care (TOC) CM/SW Contact  ?Prima Rayner, Meriam Sprague, RN ?Phone Number: ?04/10/2022, 3:57 PM ? ?Clinical Narrative:    ?Spoke with pt about where his wheelchair might be located. Pt states that he doesn't know, it might be in the park. Left voicemail with Kelli Hope, the contact number listed on his chart. TOC will continue to follow. ? ? ?Expected Discharge Plan: Home/Self Care ?Barriers to Discharge: Continued Medical Work up ? ?Expected Discharge Plan and Services ?Expected Discharge Plan: Home/Self Care ? Readmission Risk Interventions ? ?  04/08/2022  ?  1:43 PM 03/25/2022  ?  5:39 PM 02/04/2022  ?  4:41 PM  ?Readmission Risk Prevention Plan  ?Transportation Screening Complete Complete Complete  ?Medication Review (RN Care Manager) Complete Referral to Pharmacy Complete  ?PCP or Specialist appointment within 3-5 days of discharge Complete Complete Complete  ?HRI or Home Care Consult Not Complete Not Complete Complete  ?HRI or Home Care Consult Pt Refusal Comments Pt is homeless Patient is homeless   ?SW Recovery Care/Counseling Consult Complete Complete Complete  ?Palliative Care Screening Not Applicable Not Applicable Not Applicable  ?Skilled Nursing Facility Not Applicable Complete Complete  ? ? ?

## 2022-04-10 NOTE — Progress Notes (Signed)
?PROGRESS NOTE ? ? ?[name]  [id number] DOB: November 03, 1966 DOA: 04/03/2022 ?PCP: Marliss Coots, NP  ?Brief Narrative:  ? 56 y/o blk male ?  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 ?5 hospitalizations this year alone for homelessness lower extremity wounds COPD exacerbations ? ?At last admit 4/23 through 5/1 had lower extremity bilateral wounds MRI 4/23 showed question osteomyelitis-Dr. Sharol Given was consulted he did not feel there was osteo patient was kept on oxycodone and discharged on gabapentin to follow-up with Dr. Sharol Given ? ?Readmit 5/5 leg pain, suicidal ideation hypotension to the 70s with lactic acidosis ?WBC 15 platelet 383 ?UDS + cocaine + THC ?Lactic acid cycled downwards from 2.7-1.6 ? ?Patient started on vancomycin/Ceftriaxone as well as blood cultures obtained ? ?Psychiatry was consulted, orthopedics was consulted ? ?Hospital-Problem based course ? ?Sepsis from cellulitis of chronic lower extremity wound ?MRI left ankle no osteomyelitis MRI foot however shows possible osteo second digit-ABIs normal on 3/23 ?Dr. 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 ?Patient is homeless this complicates his care he has need for lower extremity dressings and cannot wear shoes on his feet-physical therapy has elected to buy him some crocs- not sure if he will ever have a safe disposition for this patient- high healthcare utilizater secondary to homelessness and polysubstance abuse ? ?  ?Consultants:  ? ? ?Procedures:  ? ?Antimicrobials:   ? ? ?Subjective: ?Some of his issues are neurocognitive ?Pain controlled ?He does not typically walk-he has a wheelchair but when he was picked up he is not sure where the wheelchair went ? ?Objective: ?Vitals:  ? 04/09/22 0533 04/09/22 0850 04/09/22 2110 04/10/22 0559  ?BP: (!) 149/92  (!) 149/92 (!) 151/118  ?Pulse: 94 92 84 (!) 107  ?Resp: 18 20 16 16   ?Temp: 97.8 ?F (36.6 ?C)  98.2 ?F (36.8 ?C) 97.9 ?F (36.6 ?C)  ?TempSrc: Oral  Oral Oral  ?SpO2: 100% 100% 100% 98%  ?Weight:      ?Height:      ? ? ?Intake/Output Summary (Last 24 hours) at 04/10/2022 1525 ?Last data filed at 04/10/2022 1000 ?Gross per 24 hour  ?Intake 240 ml  ?Output 300 ml  ?Net -60 ml  ? ? ?Filed Weights  ? 04/05/22 1900  ?Weight: 83.1 kg  ? ? ?Examination: ? ?Coherent no distress chest clear slightly tachycardic no wheeze no rhonchi ?Wounds not examined today ? ?Data Reviewed: personally reviewed  ? ?CBC ?   ?Component Value Date/Time  ? WBC 6.3 04/10/2022 0627  ? RBC 4.24 04/10/2022 0627  ? HGB 12.6 (L)  04/10/2022 [id number]  ? HCT 38.9 (L) 04/10/2022 [id number]  ? PLT 403 (H) 04/10/2022 [id number]  ? MCV 91.7 04/10/2022 0627  ? MCH 29.7 04/10/2022 0627  ? MCHC 32.4 04/10/2022 0627  ? RDW 13.9 04/10/2022 0627  ? LYMPHSABS 2.3 04/06/2022 0730  ? MONOABS 0.8 04/06/2022 0730  ? EOSABS 0.1 04/06/2022 0730  ? BASOSABS 0.1 04/06/2022  0730  ? ? ?  Latest Ref Rng & Units 04/10/2022  ?  6:27 AM 04/09/2022  ?  5:25 AM 04/08/2022  ?  5:23 AM  ?CMP  ?Glucose 70 - 99 mg/dL 125   109   136    ?BUN 6 - 20 mg/dL 13   13   12     ?Creatinine 0.61 - 1.24 mg/dL 0.92   0.94   0.84    ?Sodium 135 - 145 mmol/L 137   139   135    ?Potassium 3.5 - 5.1 mmol/L 3.8   3.9   3.6    ?Chloride 98 - 111 mmol/L 101   103   100    ?CO2 22 - 32 mmol/L 25   27   25     ?Calcium 8.9 - 10.3 mg/dL 8.8   8.9   8.4    ?Total Protein 6.5 - 8.1 g/dL 7.1      ?Total Bilirubin 0.3 - 1.2 mg/dL 0.6      ?Alkaline Phos 38 - 126 U/L 61      ?AST 15 - 41 U/L 15      ?ALT 0 - 44 U/L 14      ? ? ? ?Radiology Studies: ?No results found. ? ? ?Scheduled Meds: ? apixaban  10 mg Oral BID  ? Followed by  ? [START ON 04/12/2022] apixaban  5 mg Oral BID  ? ARIPiprazole  5 mg Oral Daily  ? atorvastatin  40 mg Oral Daily  ? carvedilol  3.125 mg Oral BID WC  ? feeding supplement  237 mL Oral BID BM  ? folic acid  1 mg Oral Daily  ? furosemide  40 mg Oral BID  ? gabapentin  400 mg Oral TID  ? insulin aspart  0-15 Units Subcutaneous TID WC  ? levETIRAcetam  500 mg Oral BID  ? multivitamin with minerals  1 tablet Oral Daily  ? nutrition supplement (JUVEN)  1 packet Oral BID BM  ? thiamine  100 mg Oral Daily  ? umeclidinium bromide  1 puff Inhalation Daily  ? ?Continuous Infusions: ? cefTRIAXone (ROCEPHIN)  IV 1 g (04/09/22 1120)  ? vancomycin 1,000 mg (04/10/22 1358)  ? ? ? LOS: 6 days  ? ?Time spent: 25 ? ?Nita Sells, MD ?[facility] ?To contact the attending provider between 7A-7P or the covering provider during after hours 7P-7A, please log into the web site [email or web address] and access using universal [place] password for that web site. If you do not have the password, please call the hospital operator. ? ?04/10/2022, 3:25 PM  ? ? ?

## 2022-04-10 NOTE — Progress Notes (Incomplete Revision)
?PROGRESS NOTE ? ? ?[name]  TJQ:[id number] DOB: [birth year] DOA: 04/03/2022 ?PCP: [name], NP  ?Brief Narrative:  ? 56 y/o blk male ?  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 ?5 hospitalizations this year alone for homelessness lower extremity wounds COPD exacerbations ? ?At last admit 4/23 through 5/1 had lower extremity bilateral wounds MRI 4/23 showed question osteomyelitis-Dr. Lajoyce Corners was consulted he did not feel there was osteo patient was kept on oxycodone and discharged on gabapentin to follow-up with Dr. Lajoyce Corners ? ?Readmit 5/5 leg pain, suicidal ideation hypotension to the 70s with lactic acidosis ?WBC 15 platelet 383 ?UDS + cocaine + THC ?Lactic acid cycled downwards from 2.7-1.6 ? ?Patient started on vancomycin/Ceftriaxone as well as blood cultures obtained ? ?Psychiatry was consulted, orthopedics was consulted ? ?Hospital-Problem based course ? ?Sepsis from cellulitis of chronic lower extremity wound ?MRI left ankle no osteomyelitis MRI foot however shows possible osteo second digit-ABIs normal on 3/23 ?Dr. Lajoyce Corners does not recommend surgical intervention ?Abx completing 5/11 [7day] ?Patient needs aggressive wound care but unclear how as patient is home-difficult disposition planning- ?Cannot give IV pain meds once he leaves-on oral opiates currently ?Very poor prognosis, very high risk for re-admit  ?R Brachial DVT diagnosed last admission ?Patient noncompliant after hospitalization transition to 5 mg dosing on 5/13--need match letter and TOC meds delivered ?Combined systolic and diastolic heart failure ?Hypotensive on admission-currently on Coreg 3.125 twice daily Lasix 40 twice daily ?Would not d/c on lasix [^^ risk AKI without labs or routine follow up] ?Major depressive disorder due to  affective-patient IVC done admission and this has been reversed ?Prior CVA with 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 ?Patient is homeless this complicates his care he has need for lower extremity dressings and cannot wear shoes on his feet-physical therapy has elected to buy him some crocs- not sure if he will ever have a safe disposition for this patient- high healthcare utilizater secondary to homelessness and polysubstance abuse ? ?  ?Consultants:  ? ? ?Procedures:  ? ?Antimicrobials:   ? ? ?Subjective: ?Some of his issues are neurocognitive ?Pain controlled ?He does not typically walk-he has a wheelchair but when he was picked up he is not sure where the wheelchair went ? ?Objective: ?Vitals:  ? 04/09/22 0533 04/09/22 0850 04/09/22 2110 04/10/22 0559  ?BP: (!) 149/92  (!) 149/92 (!) 151/118  ?Pulse: 94 92 84 (!) 107  ?Resp: 18 20 16 16   ?Temp: 97.8 ?F (36.6 ?C)  98.2 ?F (36.8 ?C) 97.9 ?F (36.6 ?C)  ?TempSrc: Oral  Oral Oral  ?SpO2: 100% 100% 100% 98%  ?Weight:      ?Height:      ? ? ?Intake/Output Summary (Last 24 hours) at 04/10/2022 1525 ?Last data filed at 04/10/2022 1000 ?Gross per 24 hour  ?Intake 240 ml  ?Output 300 ml  ?Net -60 ml  ? ? ?Filed Weights  ? 04/05/22 1900  ?Weight: 83.1 kg  ? ? ?Examination: ? ?Coherent no distress chest clear slightly tachycardic no wheeze no rhonchi ?Wounds not examined today ? ?Data Reviewed: personally reviewed  ? ?CBC ?   ?Component Value Date/Time  ? WBC 6.3  04/10/2022 2707  ? RBC 4.24 04/10/2022 0627  ? HGB 12.6 (L) 04/10/2022 8675  ? HCT 38.9 (L) 04/10/2022 4492  ? PLT 403 (H) 04/10/2022 0100  ? MCV 91.7 04/10/2022 0627  ? MCH 29.7 04/10/2022 0627  ? MCHC 32.4 04/10/2022 0627  ? RDW 13.9 04/10/2022 0627  ? LYMPHSABS 2.3 04/06/2022 0730  ?  MONOABS 0.8 04/06/2022 0730  ? EOSABS 0.1 04/06/2022 0730  ? BASOSABS 0.1 04/06/2022 0730  ? ? ?  Latest Ref Rng & Units 04/10/2022  ?  6:27 AM 04/09/2022  ?  5:25 AM 04/08/2022  ?  5:23 AM  ?CMP  ?Glucose 70 - 99 mg/dL 712   197   588    ?BUN 6 - 20 mg/dL 13   13   12     ?Creatinine 0.61 - 1.24 mg/dL   3.25   4.98    ?Sodium 135 - 145 mmol/L 137   139   135    ?Potassium 3.5 - 5.1 mmol/L 3.8   3.9   3.6    ?Chloride 98 - 111 mmol/L 101   103   100    ?CO2 22 - 32 mmol/L 25   27   25     ?Calcium 8.9 - 10.3 mg/dL 8.8   8.9   8.4    ?Total Protein 6.5 - 8.1 g/dL 7.1      ?Total Bilirubin 0.3 - 1.2 mg/dL 0.6      ?Alkaline Phos 38 - 126 U/L 61      ?AST 15 - 41 U/L 15      ?ALT 0 - 44 U/L 14      ? ? ? ?Radiology Studies: ?No results found. ? ? ?Scheduled Meds: ? apixaban  10 mg Oral BID  ? Followed by  ? [START ON 04/12/2022] apixaban  5 mg Oral BID  ? ARIPiprazole  5 mg Oral Daily  ? atorvastatin  40 mg Oral Daily  ? carvedilol  3.125 mg Oral BID WC  ? feeding supplement  237 mL Oral BID BM  ? folic acid  1 mg Oral Daily  ? furosemide  40 mg Oral BID  ? gabapentin  400 mg Oral TID  ? insulin aspart  0-15 Units Subcutaneous TID WC  ? levETIRAcetam  500 mg Oral BID  ? multivitamin with minerals  1 tablet Oral Daily  ? nutrition supplement (JUVEN)  1 packet Oral BID BM  ? thiamine  100 mg Oral Daily  ? umeclidinium bromide  1 puff Inhalation Daily  ? ?Continuous Infusions: ? cefTRIAXone (ROCEPHIN)  IV 1 g (04/09/22 1120)  ? vancomycin 1,000 mg (04/10/22 1358)  ? ? ? LOS: 6 days  ? ?Time spent: 25 ? ?06/09/22, MD ?[facility] ?To contact the attending provider between 7A-7P or the covering provider during after hours 7P-7A, please log into the web site [email or web address] and access using universal [place] password for that web site. If you do not have the password, please call the hospital operator. ? ?04/10/2022, 3:25 PM  ? ? ?

## 2022-04-11 ENCOUNTER — Other Ambulatory Visit (HOSPITAL_COMMUNITY): Payer: Self-pay

## 2022-04-11 LAB — GLUCOSE, CAPILLARY: Glucose-Capillary: 121 mg/dL — ABNORMAL HIGH (ref 70–99)

## 2022-04-11 MED ORDER — LEVETIRACETAM 500 MG PO TABS
500.0000 mg | ORAL_TABLET | Freq: Two times a day (BID) | ORAL | 1 refills | Status: DC
Start: 2022-04-11 — End: 2022-05-20
  Filled 2022-04-11: qty 60, 30d supply, fill #0

## 2022-04-11 MED ORDER — ARIPIPRAZOLE 5 MG PO TABS
5.0000 mg | ORAL_TABLET | Freq: Every day | ORAL | 3 refills | Status: DC
  Filled 2022-04-11: qty 30, 30d supply, fill #0

## 2022-04-11 MED ORDER — APIXABAN 5 MG PO TABS
5.0000 mg | ORAL_TABLET | Freq: Two times a day (BID) | ORAL | 0 refills | Status: DC
  Filled 2022-04-11 (×2): qty 60, 30d supply, fill #0

## 2022-04-11 MED ORDER — DOXYCYCLINE HYCLATE 100 MG PO TABS
100.0000 mg | ORAL_TABLET | Freq: Two times a day (BID) | ORAL | 0 refills | Status: DC
  Filled 2022-04-11: qty 28, 14d supply, fill #0

## 2022-04-11 MED ORDER — FUROSEMIDE 40 MG PO TABS
40.0000 mg | ORAL_TABLET | Freq: Every day | ORAL | 0 refills | Status: DC
  Filled 2022-04-11: qty 30, 30d supply, fill #0

## 2022-04-11 MED ORDER — OXYCODONE HCL 5 MG PO TABS: 5.0000 mg | ORAL_TABLET | Freq: Four times a day (QID) | ORAL | 0 refills | Status: DC | PRN

## 2022-04-11 MED ORDER — DOXYCYCLINE HYCLATE 100 MG PO TABS
100.0000 mg | ORAL_TABLET | Freq: Two times a day (BID) | ORAL | Status: DC
  Administered 2022-04-11: 100 mg via ORAL
  Filled 2022-04-11: qty 1

## 2022-04-11 NOTE — Discharge Summary (Signed)
Patient Discharge Summary  ?[name] TMB:[id number] DOB: 1966/04/27 DOA: 04/03/2022 ? ?PCP: Marliss Coots, NP ? ?Admit date: 04/03/2022 ?Discharge date: 04/11/2022 ? ?Time spent: 27 minutes ? ?Recommendations for Outpatient Follow-up:  ?Will need CBC c-Met in about 1 week ?Note dosage changes of various diuretics-Meds have been adjusted because of an surety of follow-up and lack of access to healthcare/labs-does however need diuresis because of his depressed EF ?Dressings will be given to the patient from floor stock on discharge-patient is homeless-his psychosocial and underlying drug abuse history complicates his disposition-it is expected he will be a very high risk for readmission because of his chronic underlying wound ? ?Discharge Diagnoses:  ?MAIN problem for hospitalization  ? ?Lower extremity cellulitis and osteomyelitis with very poor overall prognosis ? ?Please see below for itemized issues addressed in HOpsital- ?refer to other progress notes for clarity if needed ? ?Discharge Condition: Improved ? ?Diet recommendation:  ? ?Filed Weights  ? 04/05/22 1900  ?Weight: 83.1 kg  ? ? ?History of present illness:  ? 56 y/o blk male ?  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  ?5 hospitalizations this year alone for homelessness lower extremity wounds COPD exacerbations ?  ?At last admit 4/23 through 5/1 had lower extremity bilateral wounds MRI 4/23 showed question osteomyelitis-Dr. Sharol Given was consulted he did not feel there was osteo patient was kept on oxycodone and discharged on gabapentin to follow-up with Dr. Sharol Given ?  ?Readmit 5/5 leg pain, suicidal ideation hypotension to the 70s with lactic acidosis ?WBC 15 platelet 383 ?UDS + cocaine + THC ?Lactic acid cycled downwards from 2.7-1.6 ?  ?started on  vancomycin/Ceftriaxone as well as blood cultures obtained ?  ?Psychiatry was consulted, orthopedics was consulted ? ?Hospital Course:  ?Sepsis from cellulitis of chronic lower extremity wound ?MRI left ankle no osteomyelitis MRI foot however shows possible osteo second digit-ABIs normal on 3/23 ?Dr. Sharol Given does not recommend surgical intervention ?Patient needs aggressive wound care but unclear how as patient is home-difficult disposition planning- ?Cannot give IV pain meds once he leaves-on oral opiates currently ?Very poor prognosis, very high risk for re-admit patient finished 7 days total of IV antibiotics and was discharged subsequently on doxycycline twice daily for another 2 weeks ?R Brachial DVT diagnosed last admission ?Patient noncompliant after hospitalization transition to 5 mg dosing on 5/13--need match letter and TOC meds delivered ?Combined systolic and diastolic heart failure ?Hypotensive on admission-currently on Coreg 3.125 prior was on twice daily Lasix 40 twice daily ?Would not d/c on lasix high-dose-instead have left him on a lower dose of Lasix, have discontinued his Aldactone from prior to admission as these meds require routine lab follow-up and careful adjustment ?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 ?Discharge Exam: ?Vitals:  ? 04/10/22 1925 04/11/22 6950  ?BP: (!) 128/103   ?Pulse: (!) 57 100  ?Resp: 16 18  ?Temp: 98.2 ?F (36.8 ?C) 97.9 ?F (36.6 ?  C)  ?SpO2: 98% 100%  ? ? ?Subj  on day of d/c ?  ?" I want to go home where is my wheelchair"  ? ?wounds not examined today--have just been wrapped ? ?It looks like OT bought him a pair of crocs to put over his dressings ? ?General Exam on discharge ? ?Awake coherent no distress ?Sitting up in bed no rales rhonchi wheeze ?Chest clear no added sound ? ? ?Discharge Instructions ? ? ?Discharge Instructions   ? ? Diet - low sodium heart healthy   Complete by: As directed ?  ? Discharge instructions   Complete by: As directed ?  ? Take your medications as directed and please be careful taking the pain meds as they can sometimes cause you to be sleepy ?Let your medications carefully somewhat changed including your fluid pill we have cut back the dose a little bit as you will need labs in the outpatient setting to adjust the dosing with  ? Discharge wound care:   Complete by: As directed ?  ? Wound care as above  ? Increase activity slowly   Complete by: As directed ?  ? ?  ? ?Allergies as of 04/11/2022   ? ?   Reactions  ? Ibuprofen Anaphylaxis  ? Acetaminophen Hives, Nausea And Vomiting, Rash  ? Pt overdosed on bottle of APAP 05/06/16  ? Lyrica Cr [pregabalin Er] Other (See Comments)  ? Dystonia and dysarthria  ? Peanut-containing Drug Products Other (See Comments)  ? Pt denies allergy to peanut butter 03/02/2021  ? ?  ? ?  ?Medication List  ?  ? ?STOP taking these medications   ? ?atorvastatin 10 MG tablet ?Commonly known as: LIPITOR ?  ?hydrALAZINE 25 MG tablet ?Commonly known as: APRESOLINE ?  ?isosorbide mononitrate 60 MG 24 hr tablet ?Commonly known as: IMDUR ?  ?OLANZapine 5 MG tablet ?Commonly known as: ZYPREXA ?  ?Spiriva HandiHaler 18 MCG inhalation capsule ?Generic drug: tiotropium ?  ?spironolactone 25 MG tablet ?Commonly known as: ALDACTONE ?  ?valsartan 40 MG tablet ?Commonly known as: Diovan ?  ? ?  ? ?TAKE these medications   ? ?apixaban 5 MG Tabs tablet ?Commonly known as: ELIQUIS ?Take 1 tablet (5 mg total) by mouth 2 (two) times  daily. ?Start taking on: Apr 12, 2022 ?  ?ARIPiprazole 5 MG tablet ?Commonly known as: ABILIFY ?Take 1 tablet (5 mg total) by mouth daily. ?Start taking on: Apr 12, 2022 ?  ?carvedilol 3.125 MG tablet ?Commonly known as: COREG ?Take 1 tablet (3.125 mg total) by mouth 2 (two) times daily with a meal. ?  ?folic acid 1 MG tablet ?Commonly known as: FOLVITE ?Take 1 tablet (1 mg total) by mouth daily. ?  ?furosemide 40 MG tablet ?Commonly known as: Lasix ?Take 1 tablet (40 mg total) by mouth daily. ?What changed: when to take this ?  ?gabapentin 400 MG capsule ?Commonly known as: NEURONTIN ?Take 1 capsule (400 mg total) by mouth 3 (three) times daily. ?  ?levETIRAcetam 500 MG tablet ?Commonly known as: KEPPRA ?Take 1 tablet (500 mg total) by mouth 2 (two) times daily. ?  ?oxyCODONE 5 MG immediate release tablet ?Commonly known as: Oxy IR/ROXICODONE ?Take 1 tablet (5 mg total) by mouth every 6 (six) hours as needed for severe pain or breakthrough pain. ?What changed: how much to take ?  ?thiamine 100 MG tablet ?Take 1 tablet (100 mg total) by mouth daily. ?  ?Ventolin HFA 108 (90 Base) MCG/ACT inhaler ?Generic  drug: albuterol ?Inhale 2 puffs into the lungs every 6 (six) hours as needed for wheezing or shortness of breath. ?  ? ?  ? ?  ?  ? ? ?  ?Discharge Care Instructions  ?(From admission, onward)  ?  ? ? ?  ? ?  Start     Ordered  ? 04/11/22 0000  Discharge wound care:       ?Comments: Wound care as above  ? 04/11/22 0850  ? ?  ?  ? ?  ? ?Allergies  ?Allergen Reactions  ? Ibuprofen Anaphylaxis  ? Acetaminophen Hives, Nausea And Vomiting and Rash  ?  Pt overdosed on bottle of APAP 05/06/16 ?  ? Lyrica Cr [Pregabalin Er] Other (See Comments)  ?  Dystonia and dysarthria  ? Peanut-Containing Drug Products Other (See Comments)  ?  Pt denies allergy to peanut butter 03/02/2021  ? ? ? ? ?The results of significant diagnostics from this hospitalization (including imaging, microbiology, ancillary and laboratory) are listed below  for reference.   ? ?Significant Diagnostic Studies: ?DG Chest 2 View ? ?Result Date: 03/23/2022 ?CLINICAL DATA:  Shortness of breath. EXAM: CHEST - 2 VIEW COMPARISON:  AP chest 03/20/2022 CT chest 03/02/2021 FINDINGS:

## 2022-04-11 NOTE — Progress Notes (Signed)
Pt discharged to home. DC instructions given. Pt received discharge meds from pharmacy prior to discharge. Left unit in wheelchair pushed by Rollene Fare, Midwife. All belongings given. ?

## 2022-04-11 NOTE — TOC Transition Note (Signed)
Transition of Care (TOC) - CM/SW Discharge Note ? ? ?Patient Details  ?Name: [name] ?MRN: [id number] ?Date of Birth: Feb 02, 1966 ? ?Transition of Care (TOC) CM/SW Contact:  ?Daysha Ashmore, Meriam Sprague, RN ?Phone Number: ?04/11/2022, 11:46 AM ? ? ?Clinical Narrative:    ?Pt provided with a wheelchair from the congregational nursing program. Pt states that he still has the eliquis that was filled by our outpatient pharmacy on 5/1. He was provided with meds to bed for the additional prescriptions. Pt provided with [place] bus [place] get back [place]. ? ? ?Final next level of care: Home/Self Care ?Barriers to Discharge: Continued Medical Work up ? ?  ?Readmission Risk Interventions ? ?  04/08/2022  ?  1:43 PM 03/25/2022  ?  5:39 PM 02/04/2022  ?  4:41 PM  ?Readmission Risk Prevention Plan  ?Transportation Screening Complete Complete Complete  ?Medication Review (RN Care Manager) Complete Referral to Pharmacy Complete  ?PCP or Specialist appointment within 3-5 days of discharge Complete Complete Complete  ?HRI or Home Care Consult Not Complete Not Complete Complete  ?HRI or Home Care Consult Pt Refusal Comments Pt is homeless Patient is homeless   ?SW Recovery Care/Counseling Consult Complete Complete Complete  ?Palliative Care Screening Not Applicable Not Applicable Not Applicable  ?Skilled Nursing Facility Not Applicable Complete Complete  ? ? ? ? ? ?

## 2022-04-12 ENCOUNTER — Other Ambulatory Visit (HOSPITAL_COMMUNITY): Payer: Self-pay

## 2022-04-15 ENCOUNTER — Telehealth (HOSPITAL_COMMUNITY): Payer: Self-pay

## 2022-04-15 NOTE — Telephone Encounter (Signed)
Transitions of Care Pharmacy  ° °Call attempted for a pharmacy transitions of care follow-up. HIPAA appropriate voicemail was left with call back information provided.  ° °Call attempt #1. Will follow-up in 2-3 days.  °  °

## 2022-04-17 ENCOUNTER — Telehealth (HOSPITAL_COMMUNITY): Payer: Self-pay

## 2022-04-17 NOTE — Telephone Encounter (Signed)
Transitions of Care Pharmacy   Call attempted for a pharmacy transitions of care follow-up. HIPAA appropriate voicemail was left with call back information provided.   Call attempt #2. Will follow-up in 2-3 days.    

## 2022-04-18 ENCOUNTER — Other Ambulatory Visit (HOSPITAL_COMMUNITY): Payer: Self-pay

## 2022-04-22 ENCOUNTER — Telehealth (HOSPITAL_COMMUNITY): Payer: Self-pay

## 2022-04-22 NOTE — Telephone Encounter (Signed)
Transitions of Care Pharmacy   Call attempted for a pharmacy transitions of care follow-up. HIPAA appropriate voicemail was left with call back information provided.   Call attempt #3.   

## 2022-05-19 ENCOUNTER — Other Ambulatory Visit: Payer: Self-pay

## 2022-05-19 ENCOUNTER — Encounter (HOSPITAL_BASED_OUTPATIENT_CLINIC_OR_DEPARTMENT_OTHER): Payer: Self-pay

## 2022-05-19 DIAGNOSIS — Z5900 Homelessness unspecified: Secondary | ICD-10-CM | POA: Diagnosis not present

## 2022-05-19 DIAGNOSIS — I5042 Chronic combined systolic (congestive) and diastolic (congestive) heart failure: Secondary | ICD-10-CM | POA: Insufficient documentation

## 2022-05-19 DIAGNOSIS — D649 Anemia, unspecified: Secondary | ICD-10-CM | POA: Insufficient documentation

## 2022-05-19 DIAGNOSIS — I110 Hypertensive heart disease with heart failure: Secondary | ICD-10-CM | POA: Insufficient documentation

## 2022-05-19 DIAGNOSIS — M79605 Pain in left leg: Secondary | ICD-10-CM | POA: Insufficient documentation

## 2022-05-19 DIAGNOSIS — J45909 Unspecified asthma, uncomplicated: Secondary | ICD-10-CM | POA: Insufficient documentation

## 2022-05-19 DIAGNOSIS — J449 Chronic obstructive pulmonary disease, unspecified: Secondary | ICD-10-CM | POA: Diagnosis not present

## 2022-05-19 DIAGNOSIS — I2510 Atherosclerotic heart disease of native coronary artery without angina pectoris: Secondary | ICD-10-CM | POA: Insufficient documentation

## 2022-05-19 DIAGNOSIS — Z86718 Personal history of other venous thrombosis and embolism: Secondary | ICD-10-CM | POA: Insufficient documentation

## 2022-05-19 DIAGNOSIS — R600 Localized edema: Secondary | ICD-10-CM | POA: Diagnosis not present

## 2022-05-19 DIAGNOSIS — E119 Type 2 diabetes mellitus without complications: Secondary | ICD-10-CM | POA: Insufficient documentation

## 2022-05-19 DIAGNOSIS — Z91010 Allergy to peanuts: Secondary | ICD-10-CM | POA: Insufficient documentation

## 2022-05-19 DIAGNOSIS — M79604 Pain in right leg: Secondary | ICD-10-CM | POA: Diagnosis present

## 2022-05-19 DIAGNOSIS — D75839 Thrombocytosis, unspecified: Secondary | ICD-10-CM | POA: Insufficient documentation

## 2022-05-19 DIAGNOSIS — Z7901 Long term (current) use of anticoagulants: Secondary | ICD-10-CM | POA: Diagnosis not present

## 2022-05-19 DIAGNOSIS — Z79899 Other long term (current) drug therapy: Secondary | ICD-10-CM | POA: Insufficient documentation

## 2022-05-19 NOTE — ED Triage Notes (Signed)
Patient BIB [facility] from [place].  Endorses having Pain to Amputation Site of Right Knee (Site appears Well-Healed) and Pain to Left Foot (Patient had Large Toe removed from Same). Operations were completed One Month Ago but Patient states Pain has been present for a few days and worsening.  No Fevers.   NAD Noted during Triage. A&Ox4. GCS 15. BIB Personal Wheelchair.

## 2022-05-20 ENCOUNTER — Emergency Department (HOSPITAL_COMMUNITY)
Admission: EM | Admit: 2022-05-20 | Discharge: 2022-05-21 | Disposition: A | Discharge status: Home / Self Care | Payer: Medicaid Other | Source: Home / Self Care | Attending: Emergency Medicine | Admitting: Emergency Medicine

## 2022-05-20 ENCOUNTER — Encounter (HOSPITAL_COMMUNITY): Payer: Self-pay

## 2022-05-20 ENCOUNTER — Emergency Department (HOSPITAL_BASED_OUTPATIENT_CLINIC_OR_DEPARTMENT_OTHER)
Admission: EM | Admit: 2022-05-20 | Discharge: 2022-05-20 | Disposition: A | Discharge status: Home / Self Care | Payer: Medicaid Other | Attending: Emergency Medicine | Admitting: Emergency Medicine

## 2022-05-20 ENCOUNTER — Other Ambulatory Visit: Payer: Self-pay

## 2022-05-20 DIAGNOSIS — Z7901 Long term (current) use of anticoagulants: Secondary | ICD-10-CM | POA: Insufficient documentation

## 2022-05-20 DIAGNOSIS — I5040 Unspecified combined systolic (congestive) and diastolic (congestive) heart failure: Secondary | ICD-10-CM | POA: Insufficient documentation

## 2022-05-20 DIAGNOSIS — M79604 Pain in right leg: Secondary | ICD-10-CM

## 2022-05-20 DIAGNOSIS — J45909 Unspecified asthma, uncomplicated: Secondary | ICD-10-CM | POA: Insufficient documentation

## 2022-05-20 DIAGNOSIS — Z79899 Other long term (current) drug therapy: Secondary | ICD-10-CM | POA: Insufficient documentation

## 2022-05-20 DIAGNOSIS — R600 Localized edema: Secondary | ICD-10-CM | POA: Insufficient documentation

## 2022-05-20 DIAGNOSIS — Z5900 Homelessness unspecified: Secondary | ICD-10-CM | POA: Insufficient documentation

## 2022-05-20 DIAGNOSIS — I110 Hypertensive heart disease with heart failure: Secondary | ICD-10-CM | POA: Insufficient documentation

## 2022-05-20 DIAGNOSIS — R609 Edema, unspecified: Secondary | ICD-10-CM

## 2022-05-20 DIAGNOSIS — M79605 Pain in left leg: Secondary | ICD-10-CM | POA: Insufficient documentation

## 2022-05-20 DIAGNOSIS — E119 Type 2 diabetes mellitus without complications: Secondary | ICD-10-CM | POA: Insufficient documentation

## 2022-05-20 DIAGNOSIS — D75839 Thrombocytosis, unspecified: Secondary | ICD-10-CM

## 2022-05-20 DIAGNOSIS — D649 Anemia, unspecified: Secondary | ICD-10-CM

## 2022-05-20 DIAGNOSIS — Z91010 Allergy to peanuts: Secondary | ICD-10-CM | POA: Insufficient documentation

## 2022-05-20 DIAGNOSIS — Z86718 Personal history of other venous thrombosis and embolism: Secondary | ICD-10-CM | POA: Insufficient documentation

## 2022-05-20 DIAGNOSIS — J449 Chronic obstructive pulmonary disease, unspecified: Secondary | ICD-10-CM | POA: Insufficient documentation

## 2022-05-20 DIAGNOSIS — I2510 Atherosclerotic heart disease of native coronary artery without angina pectoris: Secondary | ICD-10-CM | POA: Insufficient documentation

## 2022-05-20 LAB — CBC WITH DIFFERENTIAL/PLATELET
Abs Immature Granulocytes: 0.01 10*3/uL (ref 0.00–0.07)
Basophils Absolute: 0 10*3/uL (ref 0.0–0.1)
Basophils Relative: 1 %
Eosinophils Absolute: 0.2 10*3/uL (ref 0.0–0.5)
Eosinophils Relative: 3 %
HCT: 33.6 % — ABNORMAL LOW (ref 39.0–52.0)
Hemoglobin: 11.1 g/dL — ABNORMAL LOW (ref 13.0–17.0)
Immature Granulocytes: 0 %
Lymphocytes Relative: 40 %
Lymphs Abs: 2.4 10*3/uL (ref 0.7–4.0)
MCH: 29.3 pg (ref 26.0–34.0)
MCHC: 33 g/dL (ref 30.0–36.0)
MCV: 88.7 fL (ref 80.0–100.0)
Monocytes Absolute: 0.6 10*3/uL (ref 0.1–1.0)
Monocytes Relative: 11 %
Neutro Abs: 2.7 10*3/uL (ref 1.7–7.7)
Neutrophils Relative %: 45 %
Platelets: 428 10*3/uL — ABNORMAL HIGH (ref 150–400)
RBC: 3.79 MIL/uL — ABNORMAL LOW (ref 4.22–5.81)
RDW: 16.4 % — ABNORMAL HIGH (ref 11.5–15.5)
WBC: 5.9 10*3/uL (ref 4.0–10.5)
nRBC: 0 % (ref 0.0–0.2)

## 2022-05-20 LAB — COMPREHENSIVE METABOLIC PANEL
ALT: 5 U/L (ref 0–44)
AST: 10 U/L — ABNORMAL LOW (ref 15–41)
Albumin: 3.4 g/dL — ABNORMAL LOW (ref 3.5–5.0)
Alkaline Phosphatase: 68 U/L (ref 38–126)
Anion gap: 9 (ref 5–15)
BUN: 8 mg/dL (ref 6–20)
CO2: 21 mmol/L — ABNORMAL LOW (ref 22–32)
Calcium: 9.3 mg/dL (ref 8.9–10.3)
Chloride: 109 mmol/L (ref 98–111)
Creatinine, Ser: 0.78 mg/dL (ref 0.61–1.24)
GFR, Estimated: >60 mL/min (ref >60)
Glucose, Bld: 93 mg/dL (ref 70–99)
Potassium: 4 mmol/L (ref 3.5–5.1)
Sodium: 139 mmol/L (ref 135–145)
Total Bilirubin: 0.6 mg/dL (ref 0.3–1.2)
Total Protein: 7.3 g/dL (ref 6.5–8.1)

## 2022-05-20 LAB — BRAIN NATRIURETIC PEPTIDE: B Natriuretic Peptide: 438.2 pg/mL — ABNORMAL HIGH (ref 0.0–100.0)

## 2022-05-20 LAB — MAGNESIUM: Magnesium: 2 mg/dL (ref 1.7–2.4)

## 2022-05-20 LAB — LACTIC ACID, PLASMA: Lactic Acid, Venous: 0.9 mmol/L (ref 0.5–1.9)

## 2022-05-20 MED ORDER — FUROSEMIDE 40 MG PO TABS
40.0000 mg | ORAL_TABLET | Freq: Every day | ORAL | 0 refills | Status: DC
Start: 2022-05-20 — End: 2022-07-25
  Filled 2022-05-21: qty 30, 30d supply, fill #0

## 2022-05-20 MED ORDER — CARVEDILOL 3.125 MG PO TABS
3.1250 mg | ORAL_TABLET | Freq: Two times a day (BID) | ORAL | 0 refills | Status: DC
Start: 2022-05-20 — End: 2022-07-25
  Filled 2022-05-21: qty 60, 30d supply, fill #0

## 2022-05-20 MED ORDER — OXYCODONE HCL 5 MG PO TABS: 5.0000 mg | ORAL_TABLET | Freq: Four times a day (QID) | ORAL | 0 refills | Status: DC | PRN

## 2022-05-20 MED ORDER — GABAPENTIN 300 MG PO CAPS
400.0000 mg | ORAL_CAPSULE | Freq: Once | ORAL | Status: AC
  Administered 2022-05-20: 400 mg via ORAL
  Filled 2022-05-20: qty 1

## 2022-05-20 MED ORDER — FUROSEMIDE 40 MG PO TABS
40.0000 mg | ORAL_TABLET | Freq: Once | ORAL | Status: AC
  Administered 2022-05-20: 40 mg via ORAL
  Filled 2022-05-20: qty 1

## 2022-05-20 MED ORDER — GABAPENTIN 400 MG PO CAPS
400.0000 mg | ORAL_CAPSULE | Freq: Three times a day (TID) | ORAL | 0 refills | Status: DC
  Filled 2022-05-21: qty 120, 40d supply, fill #0

## 2022-05-20 MED ORDER — THIAMINE HCL 100 MG PO TABS: 100.0000 mg | ORAL_TABLET | Freq: Every day | ORAL | 0 refills | Status: DC

## 2022-05-20 MED ORDER — APIXABAN 5 MG PO TABS
5.0000 mg | ORAL_TABLET | Freq: Two times a day (BID) | ORAL | 0 refills | Status: DC
  Filled 2022-05-21: qty 60, 30d supply, fill #0

## 2022-05-20 MED ORDER — ARIPIPRAZOLE 5 MG PO TABS
5.0000 mg | ORAL_TABLET | Freq: Every day | ORAL | 0 refills | Status: DC
  Filled 2022-05-21: qty 30, 30d supply, fill #0

## 2022-05-20 MED ORDER — LEVETIRACETAM 500 MG PO TABS
500.0000 mg | ORAL_TABLET | Freq: Two times a day (BID) | ORAL | 0 refills | Status: DC
  Filled 2022-05-21: qty 60, 30d supply, fill #0

## 2022-05-20 MED ORDER — OXYCODONE HCL 5 MG PO TABS
5.0000 mg | ORAL_TABLET | Freq: Once | ORAL | Status: AC
  Administered 2022-05-20: 5 mg via ORAL
  Filled 2022-05-20: qty 1

## 2022-05-20 MED ORDER — CARVEDILOL 3.125 MG PO TABS
3.1250 mg | ORAL_TABLET | Freq: Once | ORAL | Status: AC
  Administered 2022-05-20: 3.125 mg via ORAL
  Filled 2022-05-20: qty 1

## 2022-05-20 NOTE — ED Notes (Signed)
Discharge instructions and printed prescriptions were provided to pt. Instructions were explained. Pt provided with supplies to continue to wrap amputation. Pt leaving facility in his own wheelchair.

## 2022-05-20 NOTE — ED Triage Notes (Signed)
Pt BIB [facility] with complaints of leg pain (chronic). Pt was seen yesterday, given a prescription, and didn't pick it up.

## 2022-05-20 NOTE — Discharge Instructions (Signed)
You need to see the doctor who did your amputation for him to remove staples and make sure that everything is okay with the amputation.  You need to get all of your prescriptions filled.  They cannot help you if you do not fill them and do not take them.

## 2022-05-20 NOTE — ED Notes (Signed)
Dressing applied to left foot and ankle with Xeroform, guaze, kling & tape

## 2022-05-20 NOTE — ED Provider Notes (Cosign Needed)
[facility]ENCY DEPT Provider Note   CSN: [id number] Arrival date & time: 05/20/22  2117     History  Chief Complaint  Patient presents with   Leg Pain    [name] is a 56 y.o. male.  Patient with history of DVT, homelessness, hypertension, diabetes, hyperlipidemia, coronary artery disease, combined systolic and diastolic heart failure, COPD, asthma, schizoaffective disorder presents today with complaints of bilateral leg pain. States that same have been swollen and painful for the past week.  He is homeless and wheelchair-bound from right AKA 1 month ago at a hospital in [place].  He states he was discharged without any pain medication and has been unable to follow-up with them due to financial and transportation issues. Patient was seen for same earlier today and was discharged home with several medications, however he states that he has no money and is therefore unable to fill these prescriptions. He presents today requesting a dose of these medications to help get his pain better controlled.  The history is provided by the patient. No language interpreter was used.  Leg Pain      Home Medications Prior to Admission medications   Medication Sig Start Date End Date Taking? Authorizing Provider  albuterol (VENTOLIN HFA) 108 (90 Base) MCG/ACT inhaler Inhale 2 puffs into the lungs every 6 (six) hours as needed for wheezing or shortness of breath. 03/07/22   Meredeth Ide, MD  apixaban (ELIQUIS) 5 MG TABS tablet Take 1 tablet (5 mg total) by mouth 2 (two) times daily. 05/20/22   Dione Booze, MD  ARIPiprazole (ABILIFY) 5 MG tablet Take 1 tablet (5 mg total) by mouth daily. 05/20/22   Dione Booze, MD  carvedilol (COREG) 3.125 MG tablet Take 1 tablet (3.125 mg total) by mouth 2 (two) times daily with a meal. 05/20/22   Dione Booze, MD  folic acid (FOLVITE) 1 MG tablet Take 1 tablet (1 mg total) by mouth daily. 03/08/22   Meredeth Ide, MD  furosemide (LASIX)  40 MG tablet Take 1 tablet (40 mg total) by mouth daily. 05/20/22   Dione Booze, MD  gabapentin (NEURONTIN) 400 MG capsule Take 1 capsule (400 mg total) by mouth 3 (three) times daily. 05/20/22   Dione Booze, MD  levETIRAcetam (KEPPRA) 500 MG tablet Take 1 tablet (500 mg total) by mouth 2 (two) times daily. 05/20/22 07/19/22  Dione Booze, MD  oxyCODONE (OXY IR/ROXICODONE) 5 MG immediate release tablet Take 1 tablet (5 mg total) by mouth every 6 (six) hours as needed for severe pain or breakthrough pain. 05/20/22   Dione Booze, MD  thiamine 100 MG tablet Take 1 tablet (100 mg total) by mouth daily. 05/20/22   Dione Booze, MD      Allergies    Ibuprofen, Acetaminophen, Lyrica cr [pregabalin er], and Peanut-containing drug products    Review of Systems   Review of Systems  All other systems reviewed and are negative.   Physical Exam Updated Vital Signs There were no vitals taken for this visit. Physical Exam Vitals and nursing note reviewed.  Constitutional:      General: He is not in acute distress.    Appearance: Normal appearance. He is normal weight. He is not ill-appearing, toxic-appearing or diaphoretic.  HENT:     Head: Normocephalic and atraumatic.  Eyes:     Extraocular Movements: Extraocular movements intact.     Pupils: Pupils are equal, round, and reactive to light.  Cardiovascular:     Rate and  Rhythm: Normal rate and regular rhythm.     Heart sounds: Normal heart sounds.  Pulmonary:     Effort: Pulmonary effort is normal. No respiratory distress.     Breath sounds: Normal breath sounds.  Abdominal:     General: Abdomen is flat.     Palpations: Abdomen is soft.  Musculoskeletal:        General: Normal range of motion.     Cervical back: Normal range of motion.     Comments: Right AKA with surgical wound well-healed but staples still in place.  No surrounding erythema or edema or warmth, no purulent drainage. No evidence of infection.  Mild edema noted without erythema  or induration, generalized tenderness noted.   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  The foot is cool but not cold, difficult to assess capillary refill.  There is 2+ pretibial and pedal edema.  Venous stasis ulcerations are noted in the medial and lateral aspects of the left ankle. No palpable cord, erythema, or edema noted. Distal sensation intact. DP and PT pulses noted.   Skin:    General: Skin is warm and dry.  Neurological:     General: No focal deficit present.     Mental Status: He is alert.  Psychiatric:        Mood and Affect: Mood normal.        Behavior: Behavior normal.     ED Results / Procedures / Treatments   Labs (all labs ordered are listed, but only abnormal results are displayed) Labs Reviewed - No data to display  EKG None  Radiology No results found.  Procedures Procedures    Medications Ordered in ED Medications  oxyCODONE (Oxy IR/ROXICODONE) immediate release tablet 5 mg (has no administration in time range)  gabapentin (NEURONTIN) capsule 400 mg (has no administration in time range)  furosemide (LASIX) tablet 40 mg (has no administration in time range)  carvedilol (COREG) tablet 3.125 mg (has no administration in time range)    ED Course/ Medical Decision Making/ A&P                           Medical Decision Making  Patient presents today with pain in both lower extremities with edema of both legs.  He is afebrile, nontoxic-appearing, and in no acute distress with reassuring vital signs.  Bilateral lower extremities noninfectious appearing.  Was seen less than 24 hours ago for same and discharged with several medications.  He has been unable to fill these medications because he has no money.  Patient given oxycodone, gabapentin, Lasix, and carvedilol in the emergency department with significant improvement in symptoms. Offered social work consult, however unfortunately we do not have social work overnight and the patient does not  have a cell phone for them to call. I have cleaned and placed a dressing over his chronic left lower leg wound. Patient is known to be homeless and clearly needs to return to [place] for reevaluation by the surgeon who did his amputation, however given he has no access to transportation, no money, and is wheelchair bound there are several barriers to this. No emergent concerns at this time. Will give resources to discharge paperwork. Patient also educated to return during daytime hours to see social work if he is still unable to fill his prescriptions. Patient is otherwise stable for discharge at this time. Educated on red flag symptoms that would prompt immediate return. Discharged in stable  condition.   Of note, soon after discharge I was informed by nursing staff that the patient had wheeled himself out prior to receiving paperwork.  Findings and plan of care discussed with supervising physician Dr. Wilkie Aye who is in agreement.    Final Clinical Impression(s) / ED Diagnoses Final diagnoses:  Bilateral leg pain    Rx / DC Orders ED Discharge Orders     None     An After Visit Summary was printed and given to the patient.     Silva Bandy, PA-C 05/21/22 0003

## 2022-05-20 NOTE — ED Provider Notes (Signed)
[facility] EMERGENCY DEPT Provider Note   CSN: [id number] Arrival date & time: 05/19/22  2244     History  Chief Complaint  Patient presents with   Leg Pain    [name] is a 56 y.o. male.  The history is provided by the patient.  Leg Pain He has history of hypertension, diabetes, hyperlipidemia, coronary artery disease, combined systolic and diastolic heart failure, COPD, asthma, schizoaffective disorder and comes in complaining of pain and swelling in both of his legs for about the last week.  He is homeless and wheelchair-bound and states that he is having problems maneuvering because his wheelchair requires him to use his arms.  He had a right above-the-knee amputation about 1 month ago at a hospital in [place].  He states he was discharged without any pain medication.   Home Medications Prior to Admission medications   Medication Sig Start Date End Date Taking? Authorizing Provider  albuterol (VENTOLIN HFA) 108 (90 Base) MCG/ACT inhaler Inhale 2 puffs into the lungs every 6 (six) hours as needed for wheezing or shortness of breath. 03/07/22   Meredeth Ide, MD  apixaban (ELIQUIS) 5 MG TABS tablet Take 1 tablet (5 mg total) by mouth 2 (two) times daily. 04/12/22   Rhetta Mura, MD  ARIPiprazole (ABILIFY) 5 MG tablet Take 1 tablet (5 mg total) by mouth daily. 04/12/22   Rhetta Mura, MD  carvedilol (COREG) 3.125 MG tablet Take 1 tablet (3.125 mg total) by mouth 2 (two) times daily with a meal. 03/07/22   Meredeth Ide, MD  doxycycline (VIBRA-TABS) 100 MG tablet Take 1 tablet (100 mg total) by mouth every 12 (twelve) hours. 04/11/22   Rhetta Mura, MD  folic acid (FOLVITE) 1 MG tablet Take 1 tablet (1 mg total) by mouth daily. 03/08/22   Meredeth Ide, MD  furosemide (LASIX) 40 MG tablet Take 1 tablet (40 mg total) by mouth daily. 04/11/22 04/11/23  Rhetta Mura, MD  gabapentin (NEURONTIN) 400 MG capsule Take 1 capsule (400 mg total) by  mouth 3 (three) times daily. 03/07/22   Meredeth Ide, MD  levETIRAcetam (KEPPRA) 500 MG tablet Take 1 tablet (500 mg total) by mouth 2 (two) times daily. 04/11/22 06/10/22  Rhetta Mura, MD  oxyCODONE (OXY IR/ROXICODONE) 5 MG immediate release tablet Take 1 tablet (5 mg total) by mouth every 6 (six) hours as needed for severe pain or breakthrough pain. 04/11/22   Rhetta Mura, MD  thiamine 100 MG tablet Take 1 tablet (100 mg total) by mouth daily. Patient not taking: Reported on 04/04/2022 03/08/22   Meredeth Ide, MD      Allergies    Ibuprofen, Acetaminophen, Lyrica cr [pregabalin er], and Peanut-containing drug products    Review of Systems   Review of Systems  All other systems reviewed and are negative.   Physical Exam Updated Vital Signs BP (!) 149/104   Pulse (!) 115   Temp 97.6 F (36.4 C)   Resp 20   Ht 5\' 9"  (1.753 m)   Wt 83.1 kg   SpO2 100%   BMI 27.05 kg/m  Physical Exam Vitals and nursing note reviewed.   56 year old male, resting comfortably and in no acute distress. Vital signs are significant for elevated heart rate and blood pressure. Oxygen saturation is 100%, which is normal. Head is normocephalic and atraumatic. PERRLA, EOMI. Oropharynx is clear. Neck is nontender and supple without adenopathy or JVD. Back is nontender and there is no CVA tenderness.  Lungs are clear without rales, wheezes, or rhonchi. Chest is nontender. Heart has regular rate and rhythm without murmur. Abdomen is soft, flat, nontender. Extremities: Right above-the-knee amputation with surgical wound well-healed but staples still in place.  No evidence of infection.  There is no obvious swelling or induration, but there is generalized tenderness.  Left foot has had previous amputation of the first toe.  The foot is cool but not cold, difficult to assess capillary refill.  There is 2+ pretibial and pedal edema.  Venous stasis ulcerations are noted in the medial and lateral aspects of  the left ankle. Skin is warm and dry without rash. Neurologic: Mental status is normal, cranial nerves are intact, moves all extremities equally.  Right leg   Left foot  ED Results / Procedures / Treatments   Labs (all labs ordered are listed, but only abnormal results are displayed) Labs Reviewed  COMPREHENSIVE METABOLIC PANEL - Abnormal; Notable for the following components:      Result Value   CO2 21 (*)    Albumin 3.4 (*)    AST 10 (*)    All other components within normal limits  BRAIN NATRIURETIC PEPTIDE - Abnormal; Notable for the following components:   B Natriuretic Peptide 438.2 (*)    All other components within normal limits  CBC WITH DIFFERENTIAL/PLATELET - Abnormal; Notable for the following components:   RBC 3.79 (*)    Hemoglobin 11.1 (*)    HCT 33.6 (*)    RDW 16.4 (*)    Platelets 428 (*)    All other components within normal limits  LACTIC ACID, PLASMA  MAGNESIUM    EKG EKG Interpretation  Date/Time:  Tuesday May 20 2022 02:07:45 EDT Ventricular Rate:  117 PR Interval:  166 QRS Duration: 96 QT Interval:  353 QTC Calculation: 493 R Axis:   31 Text Interpretation: Sinus tachycardia Probable left atrial enlargement LVH with secondary repolarization abnormality Borderline prolonged QT interval rate dependent Right bundle branch block When compared with ECG of 04/03/2022, QT has lengthened Rate dependent Right bundle branch block is now present Confirmed by Dione Booze ([id number]) on 05/20/2022 2:21:39 AM  Procedures Procedures    Medications Ordered in ED Medications  oxyCODONE (Oxy IR/ROXICODONE) immediate release tablet 5 mg (5 mg Oral Given 05/20/22 0212)    ED Course/ Medical Decision Making/ A&P                           Medical Decision Making Amount and/or Complexity of Data Reviewed Labs: ordered.  Risk OTC drugs. Prescription drug management.   Pain in both lower extremities with peripheral edema of the left leg.  There is no  evidence of acute infection on exam.  Surgical wound of the right leg appears to be healing appropriately.  No obvious gangrene of the left foot but probable ongoing ischemia.  Peripheral edema secondary to known history of heart failure.  Old records are reviewed, and he has visited several different hospital systems.  The records from hospital in [place] are not available in care everywhere.  He is known to be homeless and clearly needs to return to [place] for reevaluation by the surgeon who did his amputation.  Today, I am ordering screening labs to make sure there is no evidence of electrolyte disturbance and no evidence critical ischemia.  Lab tests ordered include CBC, comprehensive metabolic panel, lactic acid level.  BNP is also ordered to evaluate status of heart failure.  He has a history of QT prolongation, so magnesium level is checked and ECG will be checked since many of the medications that I might prescribe might potentially prolong the QT interval.  I have independently viewed and interpreted the ECG, and my interpretation is a mildly elevated QT interval, left ventricular hypertrophy with secondary repolarization abnormality.  I have independently reviewed and interpreted all of the laboratory tests and my interpretation is mild anemia and mild thrombocytosis which are not significantly changed from prior.  Lactic acid level is normal, no evidence of significant ischemia.  On further review of old records, he was prescribed multiple medications, but patient states that he never got them filled.  Review of the controlled substance reporting site shows no narcotic prescriptions filled but he was prescribed a 10 tablets of oxycodone.  I have explained to him that his leg edema is from failure to take furosemide which had been prescribed, his pain is related to his not getting the gabapentin prescription filled.  Hospitalization was considered, but since he is not taking his medications it seems  more prudent to just have him restart all of his medications.  I have given him new prescriptions for all of his medications except for doxycycline which was being given for infection and he has no active infection currently.  I have confirmed with him that he is still homeless.  Importance of following up with his surgeon in [place] was stressed.  Final Clinical Impression(s) / ED Diagnoses Final diagnoses:  Pain in both lower extremities  Peripheral edema  Normochromic normocytic anemia  Thrombocytosis  Chronic anticoagulation    Rx / DC Orders ED Discharge Orders          Ordered    furosemide (LASIX) 40 MG tablet  Daily       Note to Pharmacy: Meds to beds   05/20/22 0415    apixaban (ELIQUIS) 5 MG TABS tablet  2 times daily        05/20/22 0415    ARIPiprazole (ABILIFY) 5 MG tablet  Daily        05/20/22 0415    carvedilol (COREG) 3.125 MG tablet  2 times daily with meals       Note to Pharmacy: Meds to beds   05/20/22 0415    gabapentin (NEURONTIN) 400 MG capsule  3 times daily       Note to Pharmacy: Meds to beds   05/20/22 0415    levETIRAcetam (KEPPRA) 500 MG tablet  2 times daily       Note to Pharmacy: Meds to beds   05/20/22 0415    oxyCODONE (OXY IR/ROXICODONE) 5 MG immediate release tablet  Every 6 hours PRN       Note to Pharmacy: Meds to beds   05/20/22 0415    thiamine 100 MG tablet  Daily       Note to Pharmacy: Meds to beds   05/20/22 0415              Dione Booze, MD 05/20/22 [phone]

## 2022-05-21 ENCOUNTER — Emergency Department (HOSPITAL_COMMUNITY)
Admission: EM | Admit: 2022-05-21 | Discharge: 2022-05-21 | Disposition: A | Discharge status: Home / Self Care | Payer: Medicaid Other | Attending: Emergency Medicine | Admitting: Emergency Medicine

## 2022-05-21 ENCOUNTER — Other Ambulatory Visit: Payer: Self-pay

## 2022-05-21 ENCOUNTER — Other Ambulatory Visit (HOSPITAL_COMMUNITY): Payer: Self-pay

## 2022-05-21 ENCOUNTER — Encounter (HOSPITAL_COMMUNITY): Payer: Self-pay

## 2022-05-21 DIAGNOSIS — M79604 Pain in right leg: Secondary | ICD-10-CM | POA: Diagnosis present

## 2022-05-21 DIAGNOSIS — I5040 Unspecified combined systolic (congestive) and diastolic (congestive) heart failure: Secondary | ICD-10-CM | POA: Insufficient documentation

## 2022-05-21 DIAGNOSIS — Z91010 Allergy to peanuts: Secondary | ICD-10-CM | POA: Insufficient documentation

## 2022-05-21 DIAGNOSIS — J449 Chronic obstructive pulmonary disease, unspecified: Secondary | ICD-10-CM | POA: Diagnosis not present

## 2022-05-21 DIAGNOSIS — I10 Essential (primary) hypertension: Secondary | ICD-10-CM | POA: Diagnosis not present

## 2022-05-21 DIAGNOSIS — J45909 Unspecified asthma, uncomplicated: Secondary | ICD-10-CM | POA: Insufficient documentation

## 2022-05-21 DIAGNOSIS — Z7901 Long term (current) use of anticoagulants: Secondary | ICD-10-CM | POA: Insufficient documentation

## 2022-05-21 DIAGNOSIS — I2510 Atherosclerotic heart disease of native coronary artery without angina pectoris: Secondary | ICD-10-CM | POA: Diagnosis not present

## 2022-05-21 DIAGNOSIS — E119 Type 2 diabetes mellitus without complications: Secondary | ICD-10-CM | POA: Insufficient documentation

## 2022-05-21 DIAGNOSIS — M79605 Pain in left leg: Secondary | ICD-10-CM | POA: Diagnosis not present

## 2022-05-21 MED ORDER — OXYCODONE HCL 5 MG PO TABS
5.0000 mg | ORAL_TABLET | Freq: Four times a day (QID) | ORAL | 0 refills | Status: DC | PRN
Start: 2022-05-20 — End: 2022-07-25
  Filled 2022-05-21: qty 10, 3d supply, fill #0

## 2022-05-21 MED ORDER — OXYCODONE HCL 5 MG PO TABS
5.0000 mg | ORAL_TABLET | Freq: Once | ORAL | Status: AC
  Administered 2022-05-21: 5 mg via ORAL
  Filled 2022-05-21: qty 1

## 2022-05-21 MED ORDER — OXYCODONE-ACETAMINOPHEN 5-325 MG PO TABS
1.0000 | ORAL_TABLET | Freq: Once | ORAL | Status: DC
  Filled 2022-05-21: qty 1

## 2022-05-21 NOTE — ED Triage Notes (Signed)
Pt. C/o of leg pain in both lower extremities for the past week. Pt. Has a R BKA w/ staples.

## 2022-05-21 NOTE — Progress Notes (Signed)
CSW received a consult for medication assistance. Pt stated he does not have money to pay for his meds. Pt stated he will not receive his disability money until next Friday. CSW informed pt he can get his medications filled at [facility] and [facility] CSW provided a taxi voucher for transport to the pharmacy. TOC sign   Valentina Shaggy.Nattie Lazenby, MSW, LCSWA [facility]  Transitions of Care Clinical Social Worker I Direct Dial: [phone]  Fax: [phone] [email or web address]

## 2022-05-21 NOTE — Discharge Instructions (Signed)
You were seen today for right and left leg pain and for a social work consultation. Please utilize resources through [facility] to get your prescriptions and to line up follow up appointments for your amputation post-operative care. If you note signs of developing infection or other life threats, please return for further evaluation

## 2022-05-21 NOTE — Discharge Instructions (Signed)
As we discussed, your symptoms will not improve if you are not able to fill your prescription medications and follow-up with your surgeon in [place].  If you continue to have financial and transportation issues, please return to the emergency department during daytime hours to see social work and get a better care plan in place.  Return if development of any new or worsening symptoms.

## 2022-05-21 NOTE — ED Provider Notes (Signed)
[facility]-EMERGENCY DEPT Provider Note   CSN: [id number] Arrival date & time: 05/21/22  0744     History  Chief Complaint  Patient presents with   Leg Pain    [name] is a 56 y.o. male.  Patient presents to the hospital complaining of bilateral leg pain, right greater than left.  Patient had AKA on the right side 1 month ago at a hospital in [place].  He is currently homeless and wheelchair-bound.  He was seen twice for the same problem at this emergency department yesterday and discharged home with scription's including oxycodone, gabapentin, Lasix.  The patient was also told to return today during daytime hours so that he may talk to social work.  Patient states he is no money and is unable to fill prescriptions.  The patient does have Medicaid which should provide coverage for his prescriptions.  Past medical history significant for history of DVT, homelessness, hypertension, diabetes, hyperlipidemia, CAD, combined systolic and diastolic heart failure, COPD, asthma, schizoaffective disorder, right AKA  HPI     Home Medications Prior to Admission medications   Medication Sig Start Date End Date Taking? Authorizing Provider  albuterol (VENTOLIN HFA) 108 (90 Base) MCG/ACT inhaler Inhale 2 puffs into the lungs every 6 (six) hours as needed for wheezing or shortness of breath. 03/07/22   Meredeth Ide, MD  apixaban (ELIQUIS) 5 MG TABS tablet Take 1 tablet (5 mg total) by mouth 2 (two) times daily. 05/20/22   Dione Booze, MD  ARIPiprazole (ABILIFY) 5 MG tablet Take 1 tablet (5 mg total) by mouth daily. 05/20/22   Dione Booze, MD  carvedilol (COREG) 3.125 MG tablet Take 1 tablet (3.125 mg total) by mouth 2 (two) times daily with a meal. 05/20/22   Dione Booze, MD  folic acid (FOLVITE) 1 MG tablet Take 1 tablet (1 mg total) by mouth daily. 03/08/22   Meredeth Ide, MD  furosemide (LASIX) 40 MG tablet Take 1 tablet (40 mg total) by mouth daily. 05/20/22   Dione Booze, MD  gabapentin (NEURONTIN) 400 MG capsule Take 1 capsule (400 mg total) by mouth 3 (three) times daily. 05/20/22   Dione Booze, MD  levETIRAcetam (KEPPRA) 500 MG tablet Take 1 tablet (500 mg total) by mouth 2 (two) times daily. 05/20/22 07/19/22  Dione Booze, MD  oxyCODONE (OXY IR/ROXICODONE) 5 MG immediate release tablet Take 1 tablet (5 mg total) by mouth every 6 (six) hours as needed for severe pain or breakthrough pain. Patient not taking: Reported on 05/21/2022 05/20/22   Dione Booze, MD  thiamine 100 MG tablet Take 1 tablet (100 mg total) by mouth daily. 05/20/22   Dione Booze, MD      Allergies    Motrin [ibuprofen], Tylenol [acetaminophen], Lyrica cr [pregabalin er], and Peanut-containing drug products    Review of Systems   Review of Systems  Constitutional:  Negative for fever.  Respiratory:  Negative for shortness of breath.   Cardiovascular:  Negative for chest pain.  Gastrointestinal:  Negative for abdominal pain.  Genitourinary:  Negative for dysuria.  Musculoskeletal:  Positive for arthralgias and myalgias.    Physical Exam Updated Vital Signs BP (!) 160/109 (BP Location: Left Arm)   Pulse (!) 112   Temp 98.1 F (36.7 C) (Oral)   Resp 16   SpO2 100%  Physical Exam Vitals and nursing note reviewed.  Constitutional:      General: He is not in acute distress. HENT:     Head:  Normocephalic and atraumatic.     Mouth/Throat:     Pharynx: Oropharynx is clear.  Eyes:     Conjunctiva/sclera: Conjunctivae normal.  Cardiovascular:     Rate and Rhythm: Normal rate and regular rhythm.     Pulses: Normal pulses.     Heart sounds: Normal heart sounds.  Pulmonary:     Effort: Pulmonary effort is normal.     Breath sounds: Normal breath sounds.  Abdominal:     Palpations: Abdomen is soft.     Tenderness: There is no abdominal tenderness.  Musculoskeletal:     Cervical back: Normal range of motion and neck supple.     Comments: Right AKA with surgical wound  well-healed but staples still in place.  No surrounding erythema or edema or warmth, no purulent drainage. No evidence of infection.  Generalized tenderness noted.    Left foot with previous amputation of the first toe.There is 2+ pretibial and pedal edema.  Venous stasis ulcerations are noted in the medial and lateral aspects of the left ankle. No palpable cord, erythema, or edema noted. Distal sensation intact. DP and PT pulses noted  Skin:    General: Skin is warm and dry.  Neurological:     Mental Status: He is alert and oriented to person, place, and time.     ED Results / Procedures / Treatments   Labs (all labs ordered are listed, but only abnormal results are displayed) Labs Reviewed - No data to display  EKG None  Radiology No results found.  Procedures Procedures    Medications Ordered in ED Medications  oxyCODONE (Oxy IR/ROXICODONE) immediate release tablet 5 mg (5 mg Oral Given 05/21/22 0843)    ED Course/ Medical Decision Making/ A&P                           Medical Decision Making Risk Prescription drug management.   Patient presents with a chief complaint of needing assistance with transportation and medication refills. No new complaints today.  I requested consultation with social work to see if they could offer assistance. They stated that the patient has medicaid which should provide coverage for both medications and transportation.   I ordered oxycodone for pain. Upon reassessment the patient had improved.   The patient is stable at this time for discharge. There are no new complaints. Social work explained options to the patient. Follow up with surgeon in [place].         Final Clinical Impression(s) / ED Diagnoses Final diagnoses:  Right leg pain  Left leg pain    Rx / DC Orders ED Discharge Orders     None         Pamala Duffel 05/21/22 0911    Derwood Kaplan, MD 05/21/22 1042

## 2022-05-23 ENCOUNTER — Other Ambulatory Visit: Payer: Self-pay

## 2022-05-27 ENCOUNTER — Other Ambulatory Visit: Payer: Self-pay

## 2022-06-19 ENCOUNTER — Other Ambulatory Visit: Payer: Self-pay

## 2022-06-20 ENCOUNTER — Other Ambulatory Visit: Payer: Self-pay | Admitting: Emergency Medicine

## 2022-06-20 ENCOUNTER — Other Ambulatory Visit: Payer: Self-pay

## 2022-06-23 ENCOUNTER — Other Ambulatory Visit: Payer: Self-pay

## 2022-07-12 ENCOUNTER — Inpatient Hospital Stay (HOSPITAL_COMMUNITY)
Admission: EM | Admit: 2022-07-12 | Discharge: 2022-07-25 | DRG: 291 | Disposition: A | Discharge status: Home / Self Care | Payer: Medicaid Other | Attending: Family Medicine | Admitting: Family Medicine

## 2022-07-12 ENCOUNTER — Other Ambulatory Visit: Payer: Self-pay

## 2022-07-12 ENCOUNTER — Encounter (HOSPITAL_COMMUNITY): Payer: Self-pay

## 2022-07-12 ENCOUNTER — Emergency Department (HOSPITAL_COMMUNITY): Payer: Medicaid Other

## 2022-07-12 DIAGNOSIS — J9601 Acute respiratory failure with hypoxia: Secondary | ICD-10-CM

## 2022-07-12 DIAGNOSIS — Z86718 Personal history of other venous thrombosis and embolism: Secondary | ICD-10-CM

## 2022-07-12 DIAGNOSIS — R001 Bradycardia, unspecified: Secondary | ICD-10-CM

## 2022-07-12 DIAGNOSIS — T502X5A Adverse effect of carbonic-anhydrase inhibitors, benzothiadiazides and other diuretics, initial encounter: Secondary | ICD-10-CM | POA: Diagnosis present

## 2022-07-12 DIAGNOSIS — J441 Chronic obstructive pulmonary disease with (acute) exacerbation: Secondary | ICD-10-CM | POA: Diagnosis present

## 2022-07-12 DIAGNOSIS — E876 Hypokalemia: Secondary | ICD-10-CM | POA: Diagnosis not present

## 2022-07-12 DIAGNOSIS — F250 Schizoaffective disorder, bipolar type: Secondary | ICD-10-CM | POA: Diagnosis present

## 2022-07-12 DIAGNOSIS — F1410 Cocaine abuse, uncomplicated: Secondary | ICD-10-CM | POA: Diagnosis present

## 2022-07-12 DIAGNOSIS — Z20822 Contact with and (suspected) exposure to covid-19: Secondary | ICD-10-CM | POA: Diagnosis present

## 2022-07-12 DIAGNOSIS — Z5900 Homelessness unspecified: Secondary | ICD-10-CM

## 2022-07-12 DIAGNOSIS — E11622 Type 2 diabetes mellitus with other skin ulcer: Secondary | ICD-10-CM | POA: Diagnosis present

## 2022-07-12 DIAGNOSIS — I248 Other forms of acute ischemic heart disease: Secondary | ICD-10-CM | POA: Diagnosis not present

## 2022-07-12 DIAGNOSIS — I2510 Atherosclerotic heart disease of native coronary artery without angina pectoris: Secondary | ICD-10-CM | POA: Diagnosis present

## 2022-07-12 DIAGNOSIS — I5023 Acute on chronic systolic (congestive) heart failure: Secondary | ICD-10-CM | POA: Diagnosis not present

## 2022-07-12 DIAGNOSIS — L97329 Non-pressure chronic ulcer of left ankle with unspecified severity: Secondary | ICD-10-CM | POA: Diagnosis present

## 2022-07-12 DIAGNOSIS — F1910 Other psychoactive substance abuse, uncomplicated: Secondary | ICD-10-CM

## 2022-07-12 DIAGNOSIS — R0602 Shortness of breath: Principal | ICD-10-CM

## 2022-07-12 DIAGNOSIS — Z91199 Patient's noncompliance with other medical treatment and regimen due to unspecified reason: Secondary | ICD-10-CM

## 2022-07-12 DIAGNOSIS — Z781 Physical restraint status: Secondary | ICD-10-CM

## 2022-07-12 DIAGNOSIS — Z91148 Patient's other noncompliance with medication regimen for other reason: Secondary | ICD-10-CM

## 2022-07-12 DIAGNOSIS — J440 Chronic obstructive pulmonary disease with acute lower respiratory infection: Secondary | ICD-10-CM | POA: Diagnosis present

## 2022-07-12 DIAGNOSIS — E1165 Type 2 diabetes mellitus with hyperglycemia: Secondary | ICD-10-CM | POA: Diagnosis not present

## 2022-07-12 DIAGNOSIS — E11649 Type 2 diabetes mellitus with hypoglycemia without coma: Secondary | ICD-10-CM | POA: Diagnosis not present

## 2022-07-12 DIAGNOSIS — Z91010 Allergy to peanuts: Secondary | ICD-10-CM

## 2022-07-12 DIAGNOSIS — D638 Anemia in other chronic diseases classified elsewhere: Secondary | ICD-10-CM

## 2022-07-12 DIAGNOSIS — I110 Hypertensive heart disease with heart failure: Principal | ICD-10-CM | POA: Diagnosis present

## 2022-07-12 DIAGNOSIS — J189 Pneumonia, unspecified organism: Secondary | ICD-10-CM | POA: Diagnosis present

## 2022-07-12 DIAGNOSIS — R9431 Abnormal electrocardiogram [ECG] [EKG]: Secondary | ICD-10-CM | POA: Diagnosis present

## 2022-07-12 DIAGNOSIS — F17210 Nicotine dependence, cigarettes, uncomplicated: Secondary | ICD-10-CM | POA: Diagnosis present

## 2022-07-12 DIAGNOSIS — Z888 Allergy status to other drugs, medicaments and biological substances status: Secondary | ICD-10-CM

## 2022-07-12 DIAGNOSIS — F10139 Alcohol abuse with withdrawal, unspecified: Secondary | ICD-10-CM | POA: Diagnosis not present

## 2022-07-12 DIAGNOSIS — K219 Gastro-esophageal reflux disease without esophagitis: Secondary | ICD-10-CM | POA: Diagnosis present

## 2022-07-12 DIAGNOSIS — I69331 Monoplegia of upper limb following cerebral infarction affecting right dominant side: Secondary | ICD-10-CM

## 2022-07-12 DIAGNOSIS — F05 Delirium due to known physiological condition: Secondary | ICD-10-CM | POA: Diagnosis not present

## 2022-07-12 DIAGNOSIS — I428 Other cardiomyopathies: Secondary | ICD-10-CM | POA: Diagnosis present

## 2022-07-12 DIAGNOSIS — Z79899 Other long term (current) drug therapy: Secondary | ICD-10-CM

## 2022-07-12 DIAGNOSIS — R680 Hypothermia, not associated with low environmental temperature: Secondary | ICD-10-CM | POA: Diagnosis present

## 2022-07-12 DIAGNOSIS — Z7901 Long term (current) use of anticoagulants: Secondary | ICD-10-CM

## 2022-07-12 DIAGNOSIS — F339 Major depressive disorder, recurrent, unspecified: Secondary | ICD-10-CM | POA: Diagnosis present

## 2022-07-12 DIAGNOSIS — I83009 Varicose veins of unspecified lower extremity with ulcer of unspecified site: Secondary | ICD-10-CM

## 2022-07-12 DIAGNOSIS — L8990 Pressure ulcer of unspecified site, unspecified stage: Secondary | ICD-10-CM | POA: Insufficient documentation

## 2022-07-12 DIAGNOSIS — I509 Heart failure, unspecified: Secondary | ICD-10-CM

## 2022-07-12 DIAGNOSIS — I959 Hypotension, unspecified: Secondary | ICD-10-CM | POA: Diagnosis not present

## 2022-07-12 DIAGNOSIS — F1210 Cannabis abuse, uncomplicated: Secondary | ICD-10-CM | POA: Diagnosis present

## 2022-07-12 DIAGNOSIS — Z886 Allergy status to analgesic agent status: Secondary | ICD-10-CM

## 2022-07-12 DIAGNOSIS — F1010 Alcohol abuse, uncomplicated: Secondary | ICD-10-CM | POA: Diagnosis present

## 2022-07-12 DIAGNOSIS — G928 Other toxic encephalopathy: Secondary | ICD-10-CM | POA: Diagnosis not present

## 2022-07-12 DIAGNOSIS — T501X6A Underdosing of loop [high-ceiling] diuretics, initial encounter: Secondary | ICD-10-CM | POA: Diagnosis present

## 2022-07-12 DIAGNOSIS — G9341 Metabolic encephalopathy: Secondary | ICD-10-CM | POA: Diagnosis present

## 2022-07-12 DIAGNOSIS — R079 Chest pain, unspecified: Secondary | ICD-10-CM

## 2022-07-12 DIAGNOSIS — E785 Hyperlipidemia, unspecified: Secondary | ICD-10-CM | POA: Diagnosis present

## 2022-07-12 DIAGNOSIS — Z89611 Acquired absence of right leg above knee: Secondary | ICD-10-CM

## 2022-07-12 DIAGNOSIS — Z5902 Unsheltered homelessness: Secondary | ICD-10-CM

## 2022-07-12 DIAGNOSIS — I5043 Acute on chronic combined systolic (congestive) and diastolic (congestive) heart failure: Secondary | ICD-10-CM | POA: Diagnosis present

## 2022-07-12 LAB — CBC WITH DIFFERENTIAL/PLATELET
Abs Immature Granulocytes: 0.01 10*3/uL (ref 0.00–0.07)
Basophils Absolute: 0 10*3/uL (ref 0.0–0.1)
Basophils Relative: 1 %
Eosinophils Absolute: 0 10*3/uL (ref 0.0–0.5)
Eosinophils Relative: 0 %
HCT: 36.5 % — ABNORMAL LOW (ref 39.0–52.0)
Hemoglobin: 11.6 g/dL — ABNORMAL LOW (ref 13.0–17.0)
Immature Granulocytes: 0 %
Lymphocytes Relative: 35 %
Lymphs Abs: 1.8 10*3/uL (ref 0.7–4.0)
MCH: 29.4 pg (ref 26.0–34.0)
MCHC: 31.8 g/dL (ref 30.0–36.0)
MCV: 92.6 fL (ref 80.0–100.0)
Monocytes Absolute: 0.4 10*3/uL (ref 0.1–1.0)
Monocytes Relative: 8 %
Neutro Abs: 3 10*3/uL (ref 1.7–7.7)
Neutrophils Relative %: 56 %
Platelets: 238 10*3/uL (ref 150–400)
RBC: 3.94 MIL/uL — ABNORMAL LOW (ref 4.22–5.81)
RDW: 18.3 % — ABNORMAL HIGH (ref 11.5–15.5)
WBC: 5.3 10*3/uL (ref 4.0–10.5)
nRBC: 0 % (ref 0.0–0.2)

## 2022-07-12 LAB — COMPREHENSIVE METABOLIC PANEL
ALT: 16 U/L (ref 0–44)
AST: 24 U/L (ref 15–41)
Albumin: 3.4 g/dL — ABNORMAL LOW (ref 3.5–5.0)
Alkaline Phosphatase: 116 U/L (ref 38–126)
Anion gap: 8 (ref 5–15)
BUN: 13 mg/dL (ref 6–20)
CO2: 19 mmol/L — ABNORMAL LOW (ref 22–32)
Calcium: 8.7 mg/dL — ABNORMAL LOW (ref 8.9–10.3)
Chloride: 111 mmol/L (ref 98–111)
Creatinine, Ser: 1.09 mg/dL (ref 0.61–1.24)
GFR, Estimated: >60 mL/min (ref >60)
Glucose, Bld: 92 mg/dL (ref 70–99)
Potassium: 4 mmol/L (ref 3.5–5.1)
Sodium: 138 mmol/L (ref 135–145)
Total Bilirubin: 1.1 mg/dL (ref 0.3–1.2)
Total Protein: 7.1 g/dL (ref 6.5–8.1)

## 2022-07-12 LAB — BRAIN NATRIURETIC PEPTIDE: B Natriuretic Peptide: 2089 pg/mL — ABNORMAL HIGH (ref 0.0–100.0)

## 2022-07-12 LAB — SARS CORONAVIRUS 2 BY RT PCR: SARS Coronavirus 2 by RT PCR: NEGATIVE

## 2022-07-12 MED ORDER — PROCHLORPERAZINE EDISYLATE 10 MG/2ML IJ SOLN
5.0000 mg | Freq: Once | INTRAMUSCULAR | Status: AC
  Administered 2022-07-12: 5 mg via INTRAVENOUS
  Filled 2022-07-12: qty 2

## 2022-07-12 MED ORDER — THIAMINE HCL 100 MG PO TABS
100.0000 mg | ORAL_TABLET | Freq: Every day | ORAL | Status: DC
  Administered 2022-07-12 – 2022-07-13 (×2): 100 mg via ORAL
  Filled 2022-07-12 (×2): qty 1

## 2022-07-12 MED ORDER — GABAPENTIN 400 MG PO CAPS
400.0000 mg | ORAL_CAPSULE | Freq: Three times a day (TID) | ORAL | Status: DC
Start: 2022-07-12 — End: 2022-07-15
  Administered 2022-07-12 – 2022-07-15 (×4): 400 mg via ORAL
  Filled 2022-07-12 (×5): qty 1

## 2022-07-12 MED ORDER — FUROSEMIDE 10 MG/ML IJ SOLN
20.0000 mg | Freq: Two times a day (BID) | INTRAMUSCULAR | Status: DC
Start: 2022-07-12 — End: 2022-07-13
  Administered 2022-07-12 – 2022-07-13 (×2): 20 mg via INTRAVENOUS
  Filled 2022-07-12: qty 2
  Filled 2022-07-12: qty 4

## 2022-07-12 MED ORDER — ALBUTEROL SULFATE (2.5 MG/3ML) 0.083% IN NEBU
2.5000 mg | INHALATION_SOLUTION | Freq: Four times a day (QID) | RESPIRATORY_TRACT | Status: DC | PRN
  Administered 2022-07-12 – 2022-07-13 (×2): 2.5 mg via RESPIRATORY_TRACT
  Filled 2022-07-12 (×2): qty 3

## 2022-07-12 MED ORDER — APIXABAN 5 MG PO TABS
5.0000 mg | ORAL_TABLET | Freq: Two times a day (BID) | ORAL | Status: DC
  Administered 2022-07-12 – 2022-07-13 (×3): 5 mg via ORAL
  Filled 2022-07-12 (×3): qty 1

## 2022-07-12 MED ORDER — PROCHLORPERAZINE EDISYLATE 10 MG/2ML IJ SOLN: 5.0000 mg | INTRAMUSCULAR | Status: DC | PRN

## 2022-07-12 MED ORDER — POTASSIUM CHLORIDE CRYS ER 20 MEQ PO TBCR
40.0000 meq | EXTENDED_RELEASE_TABLET | Freq: Once | ORAL | Status: AC
  Administered 2022-07-12: 40 meq via ORAL
  Filled 2022-07-12: qty 2

## 2022-07-12 MED ORDER — ARIPIPRAZOLE 5 MG PO TABS
5.0000 mg | ORAL_TABLET | Freq: Every day | ORAL | Status: DC
Start: 2022-07-12 — End: 2022-07-15
  Administered 2022-07-12 – 2022-07-15 (×3): 5 mg via ORAL
  Filled 2022-07-12 (×4): qty 1

## 2022-07-12 MED ORDER — CARVEDILOL 3.125 MG PO TABS
3.1250 mg | ORAL_TABLET | Freq: Two times a day (BID) | ORAL | Status: DC
  Administered 2022-07-12 – 2022-07-13 (×3): 3.125 mg via ORAL
  Filled 2022-07-12 (×3): qty 1

## 2022-07-12 MED ORDER — FOLIC ACID 1 MG PO TABS
1.0000 mg | ORAL_TABLET | Freq: Every day | ORAL | Status: DC
  Administered 2022-07-12 – 2022-07-13 (×2): 1 mg via ORAL
  Filled 2022-07-12 (×2): qty 1

## 2022-07-12 MED ORDER — ENOXAPARIN SODIUM 40 MG/0.4ML IJ SOSY: 40.0000 mg | PREFILLED_SYRINGE | INTRAMUSCULAR | Status: DC

## 2022-07-12 MED ORDER — HYDROMORPHONE HCL 1 MG/ML IJ SOLN
1.0000 mg | Freq: Once | INTRAMUSCULAR | Status: AC
  Administered 2022-07-12: 1 mg via INTRAVENOUS
  Filled 2022-07-12: qty 1

## 2022-07-12 MED ORDER — OXYCODONE HCL 5 MG PO TABS
5.0000 mg | ORAL_TABLET | Freq: Four times a day (QID) | ORAL | Status: DC | PRN
  Administered 2022-07-12 – 2022-07-13 (×2): 5 mg via ORAL
  Filled 2022-07-12 (×2): qty 1

## 2022-07-12 MED ORDER — DOXYCYCLINE HYCLATE 100 MG PO TABS
100.0000 mg | ORAL_TABLET | Freq: Two times a day (BID) | ORAL | Status: DC
  Administered 2022-07-12 – 2022-07-13 (×2): 100 mg via ORAL
  Filled 2022-07-12 (×2): qty 1

## 2022-07-12 MED ORDER — FUROSEMIDE 10 MG/ML IJ SOLN
40.0000 mg | Freq: Once | INTRAMUSCULAR | Status: AC
  Administered 2022-07-12: 40 mg via INTRAVENOUS
  Filled 2022-07-12: qty 4

## 2022-07-12 MED ORDER — BUDESONIDE 0.5 MG/2ML IN SUSP
2.0000 mg | Freq: Two times a day (BID) | RESPIRATORY_TRACT | Status: DC
  Administered 2022-07-12 (×2): 2 mg via RESPIRATORY_TRACT
  Filled 2022-07-12 (×3): qty 8

## 2022-07-12 MED ORDER — LEVETIRACETAM 500 MG PO TABS
500.0000 mg | ORAL_TABLET | Freq: Two times a day (BID) | ORAL | Status: DC
  Administered 2022-07-12 – 2022-07-13 (×3): 500 mg via ORAL
  Filled 2022-07-12 (×3): qty 1

## 2022-07-12 MED ORDER — SODIUM CHLORIDE 0.9 % IV SOLN
1.0000 g | Freq: Once | INTRAVENOUS | Status: AC
  Administered 2022-07-12: 1 g via INTRAVENOUS
  Filled 2022-07-12: qty 10

## 2022-07-12 MED ORDER — POTASSIUM CHLORIDE CRYS ER 20 MEQ PO TBCR
20.0000 meq | EXTENDED_RELEASE_TABLET | Freq: Every day | ORAL | Status: DC
Start: 2022-07-13 — End: 2022-07-14
  Administered 2022-07-13: 20 meq via ORAL
  Filled 2022-07-12: qty 1

## 2022-07-12 MED ORDER — SODIUM CHLORIDE 0.9 % IV SOLN
2.0000 g | INTRAVENOUS | Status: DC
  Administered 2022-07-13: 2 g via INTRAVENOUS
  Filled 2022-07-12: qty 20

## 2022-07-12 MED ORDER — LISINOPRIL 2.5 MG PO TABS
2.5000 mg | ORAL_TABLET | Freq: Every day | ORAL | Status: DC
  Administered 2022-07-12 – 2022-07-13 (×2): 2.5 mg via ORAL
  Filled 2022-07-12 (×2): qty 1

## 2022-07-12 MED ORDER — DOXYCYCLINE HYCLATE 100 MG PO TABS
100.0000 mg | ORAL_TABLET | Freq: Once | ORAL | Status: AC
  Administered 2022-07-12: 100 mg via ORAL
  Filled 2022-07-12: qty 1

## 2022-07-12 NOTE — H&P (Signed)
History and Physical    Patient: [name] [id number] DOB: 1966-04-14 DOA: 07/12/2022 DOS: the patient was seen and examined on 07/12/2022 PCP: Marliss Coots, NP  Patient coming from: Homeless  Chief Complaint:  Chief Complaint  Patient presents with   Shortness of Breath   HPI: [name] is a 56 y.o. male with medical history significant of asthma, bipolar 1 disorder, depression, schizoaffective disorder, chronic congestive heart failure, homelessness, GERD, hypertension, QT prolongation, type 2 diabetes, alcohol abuse, cocaine and cannabis abuse, tobacco use, chronic combined systolic and diastolic heart failure, right AKA who is coming to the emergency department with progressively worse dyspnea associated with lower extremity edema, orthopnea, occasional chest pressure sensation and whitish sputum productive cough after being off medications for the past 2 weeks.  He denied any dietary indiscretions.  He also complains of left lower extremity pain.  No significant changing his wounds. He denied fever, chills, rhinorrhea, sore throat, wheezing or hemoptysis. No abdominal pain, nausea, emesis, diarrhea, constipation, melena or hematochezia.  No flank pain, dysuria, frequency or hematuria.  No polyuria, polydipsia, polyphagia or blurred vision.   ED course: Initial vital signs were temperature 97.7 F, pulse 98, respirations 20, BP 157/99 mmHg O2 sat 99% on 2 L.  Patient received furosemide 40 mg IVP, ceftriaxone 1 g IVPB and doxycycline 100 mg p.o.  Lab work: CBC is her white count of 5.3, hemoglobin 11.6 g/dL platelets 238.  Coronavirus PCR was negative.  BNP was 2089 pg/mL.  BMP showed a CO2 of 19 mmol/L with normal anion gap and albumin of 3.4 g/dL.  The rest of the CMP results are normal once calcium is corrected.  Imaging: Cardiomegaly with questionable for pneumonia, right midlung opacity.  Left foot and left ankle x-ray with no osteomyelitis or fractures.   Review of  Systems: As mentioned in the history of present illness. All other systems reviewed and are negative.  Past Medical History:  Diagnosis Date   Asthma    Bipolar 1 disorder ([place])    CHF (congestive heart failure) (HCC)    COPD (chronic obstructive pulmonary disease) ([place])    Depression    Diabetes mellitus without complication (HCC)    GERD (gastroesophageal reflux disease)    Homelessness    Hypertension    QT prolongation    Schizoaffective disorder [facility])    Past Surgical History:  Procedure Laterality Date   AMPUTATION TOE Bilateral 01/02/2021   Procedure: AMPUTATION TOE;  Surgeon: Evelina Bucy, DPM;  Location: [place];  Service: Podiatry;  Laterality: Bilateral;  Ok to leave patient on hospital bed   IR ANGIO INTRA EXTRACRAN SEL COM CAROTID INNOMINATE BILAT MOD SED  12/17/2020   IR ANGIO VERTEBRAL SEL VERTEBRAL BILAT MOD SED  12/17/2020   RADIOLOGY WITH ANESTHESIA N/A 12/17/2020   Procedure: IR WITH ANESTHESIA;  Surgeon: Radiologist, Medication, MD;  Location: [place];  Service: Radiology;  Laterality: N/A;   Social History:  reports that he has been smoking cigarettes. He has never used smokeless tobacco. He reports current alcohol use. He reports current drug use. Drug: Cocaine.  Allergies  Allergen Reactions   Motrin [Ibuprofen] Anaphylaxis   Tylenol [Acetaminophen] Hives, Nausea And Vomiting and Rash   Lyrica Cr [Pregabalin Er] Other (See Comments)    Dystonia Dysarthria    Peanut-Containing Drug Products Other (See Comments)    Pt denies allergy to peanut butter 03/02/2021    Family History  Problem Relation Age of Onset   Drug abuse Mother  Suicidality Mother     Prior to Admission medications   Medication Sig Start Date End Date Taking? Authorizing Provider  albuterol (VENTOLIN HFA) 108 (90 Base) MCG/ACT inhaler Inhale 2 puffs into the lungs every 6 (six) hours as needed for wheezing or shortness of breath. 03/07/22   Meredeth Ide, MD  apixaban (ELIQUIS) 5 MG TABS  tablet Take 1 tablet (5 mg total) by mouth 2 (two) times daily. 05/20/22   Dione Booze, MD  ARIPiprazole (ABILIFY) 5 MG tablet Take 1 tablet (5 mg total) by mouth daily. 05/20/22   Dione Booze, MD  carvedilol (COREG) 3.125 MG tablet Take 1 tablet (3.125 mg total) by mouth 2 (two) times daily with a meal. 05/20/22   Dione Booze, MD  folic acid (FOLVITE) 1 MG tablet Take 1 tablet (1 mg total) by mouth daily. 03/08/22   Meredeth Ide, MD  furosemide (LASIX) 40 MG tablet Take 1 tablet (40 mg total) by mouth daily. 05/20/22   Dione Booze, MD  gabapentin (NEURONTIN) 400 MG capsule Take 1 capsule (400 mg total) by mouth 3 (three) times daily. 05/20/22   Dione Booze, MD  levETIRAcetam (KEPPRA) 500 MG tablet Take 1 tablet (500 mg total) by mouth 2 (two) times daily. 05/20/22 07/19/22  Dione Booze, MD  oxyCODONE (OXY IR/ROXICODONE) 5 MG immediate release tablet Take 1 tablet (5 mg total) by mouth every 6 (six) hours as needed for severe pain or breakthrough pain. Patient not taking: Reported on 05/21/2022 05/20/22   Dione Booze, MD  oxyCODONE (OXY IR/ROXICODONE) 5 MG immediate release tablet Take 1 tablet (5 mg total) by mouth every 6 (six) hours as needed for severe pain or breakthrough pain . 05/20/22   Dione Booze, MD  thiamine 100 MG tablet Take 1 tablet (100 mg total) by mouth daily. 05/20/22   Dione Booze, MD    Physical Exam: Vitals:   07/12/22 0733 07/12/22 0739 07/12/22 0825 07/12/22 0926  BP:  (!) 157/99  (!) 145/116  Pulse:  98 93 99  Resp:  20 (!) 23 20  Temp:  97.7 F (36.5 C)    TempSrc:  Oral    SpO2:  99% 100% 100%  Weight: 83 kg     Height: 5\' 9"  (1.753 m)      Physical Exam Vitals and nursing note reviewed.  Constitutional:      General: He is awake.     Appearance: He is well-developed.  HENT:     Head: Normocephalic.     Nose: No rhinorrhea.     Mouth/Throat:     Mouth: Mucous membranes are moist.  Eyes:     Pupils: Pupils are equal, round, and reactive to light.  Neck:      Vascular: No JVD.  Cardiovascular:     Rate and Rhythm: Normal rate and regular rhythm.     Heart sounds: S1 normal and S2 normal.  Pulmonary:     Effort: Pulmonary effort is normal.     Breath sounds: Examination of the right-lower field reveals rales. Examination of the left-lower field reveals rales. Rales present. No wheezing or rhonchi.  Abdominal:     General: There is no distension.     Palpations: Abdomen is soft.     Tenderness: There is no abdominal tenderness. There is no right CVA tenderness or left CVA tenderness.  Musculoskeletal:     Cervical back: Neck supple.     Left lower leg: 3+ Edema present.     Right  Lower Extremity: Right leg is amputated above knee.  Skin:    General: Skin is warm and dry.  Neurological:     General: No focal deficit present.     Mental Status: He is alert and oriented to person, place, and time.  Psychiatric:        Mood and Affect: Mood normal.        Behavior: Behavior is cooperative.   Data Reviewed:  There are no new results to review at this time.  01/30/2022 echocardiogram  IMPRESSIONS:  1. LV apical false tendon (normal variant). Left ventricular ejection  fraction, by estimation, is <20%. The left ventricle has severely  decreased function. The left ventricle demonstrates global hypokinesis.  The left ventricular internal cavity size was   mildly dilated. There is mild left ventricular hypertrophy. Left  ventricular diastolic parameters are consistent with Grade III diastolic  dysfunction (restrictive). Elevated left ventricular end-diastolic  pressure. The E/e' is 27.   2. Right ventricular systolic function is mildly reduced. The right  ventricular size is mildly enlarged. There is moderately elevated  pulmonary artery systolic pressure. The estimated right ventricular  systolic pressure is [id number] mmHg.   3. Left atrial size was moderately dilated.   4. Right atrial size was moderately dilated.   5. The mitral valve is  abnormal. Mild to moderate mitral valve  regurgitation.   6. The aortic valve is tricuspid. Aortic valve regurgitation is not  visualized. Aortic valve sclerosis is present, with no evidence of aortic  valve stenosis.   7. Mildly dilated pulmonary artery.   8. The inferior vena cava is normal in size with <50% respiratory  variability, suggesting right atrial pressure of 8 mmHg.   ECG: Vent. rate 105 BPM PR interval 168 ms QRS duration 99 ms QT/QTcB 394/521 ms P-R-T axes 80 6 91 Sinus tachycardia Paired ventricular premature complexes Right atrial enlargement Nonspecific T abnormalities, lateral leads Prolonged QT interval  Assessment and Plan: Principal Problem:   Acute on chronic systolic congestive heart failure (HCC) Observation/telemetry. Continue supplemental oxygen.   Sodium and fluid restriction. Continue furosemide 20 mg IVP BID Monitor daily weights, intake and output. Begin lisinopril 2.5 mg p.o. daily. No beta-blocker now due to acute decompensation. Will resume beta-blocker in AM.  Active Problems:   COPD with acute exacerbation (HCC) Continue supplemental oxygen. Begin budesonide nebs 2 mg every 12 hours. As needed bronchodilators. Follow-up CBC and chemistry in the morning.     CAP (community acquired pneumonia) On supplemental oxygen. Continue ceftriaxone 1 g IVPB daily. Continue doxycycline 100 mg p.o. twice daily. Check strep pneumoniae urinary antigen. Check sputum Gram stain, culture and sensitivity. Follow-up CBC and chemistry in the morning.    Alcohol use disorder, mild, abuse Not currently drinking. Monitor for signs of withdrawals. Continue thiamine supplementation.    Homelessness Consult TOC.    Schizoaffective disorder, bipolar type ([place])   Major depressive disorder, recurrent episode ([place]) No SI or HI. Resume Abilify 5 mg p.o. daily. Follow-up with psych and counseling as an outpatient    Hyperlipidemia Currently not on  medical therapy. Will not start giving EtOH and compliance issues    Prolonged QT interval Keep electrolytes optimized. Avoid QT prolonging meds.    History of deep vein thrombosis (DVT) Resonium apixaban twice daily.     Advance Care Planning:   Code Status: Full Code   Consults:   Family Communication:   Severity of Illness: The appropriate patient status for this patient is OBSERVATION.  Observation status is judged to be reasonable and necessary in order to provide the required intensity of service to ensure the patient's safety. The patient's presenting symptoms, physical exam findings, and initial radiographic and laboratory data in the context of their medical condition is felt to place them at decreased risk for further clinical deterioration. Furthermore, it is anticipated that the patient will be medically stable for discharge from the hospital within 2 midnights of admission.   Author: Bobette Mo, MD 07/12/2022 11:00 AM  For on call review [email or web address].   This document was prepared using Dragon voice recognition software and may contain some unintended transcription errors.

## 2022-07-12 NOTE — ED Triage Notes (Addendum)
Patient BIB EMS for shortness of breath. Patient is noncomplaint with lasix and home oxygen.  Reports he hasnt had either in 2 weeks.  Patient was 99% on RA with Ems. Patient is homeless and right amputee has wound to left medial ankle.

## 2022-07-12 NOTE — ED Notes (Signed)
O2 sat 88%, pt had removed nasal cannula. Instructed pt to keep O2 on at all times.

## 2022-07-12 NOTE — ED Notes (Signed)
Pt c/ struggling to breathe- labored breathing noted, RR 26. MD notified, new order to increase O2 and RT to assess pt for BiPap need

## 2022-07-12 NOTE — ED Provider Notes (Signed)
[facility] DEPT Provider Note   CSN: [id number] Arrival date & time: 07/12/22  [id number]     History  Chief Complaint  Patient presents with   Shortness of Breath    [name] is a 56 y.o. male.  Patient with history of congestive heart failure, polysubstance abuse, right lower extremity AKA --presents to the emergency department complaining of shortness of breath as well as left lower extremity swelling and wounds.  Patient continues to be homeless, using drugs, using wheelchair.  States that his wounds are chronic, have looked worse.  No active drainage or fevers reported.  He does not want his leg to get to the point where he needs an amputation on that side as well.  He also reports increasing shortness of breath.  He has not taken his Lasix in about 2 weeks.  States that he does not have medications.  No cough.  Room air oxygen saturation normal with EMS.     Home Medications Prior to Admission medications   Medication Sig Start Date End Date Taking? Authorizing Provider  albuterol (VENTOLIN HFA) 108 (90 Base) MCG/ACT inhaler Inhale 2 puffs into the lungs every 6 (six) hours as needed for wheezing or shortness of breath. 03/07/22   [name], MD  apixaban (ELIQUIS) 5 MG TABS tablet Take 1 tablet (5 mg total) by mouth 2 (two) times daily. 123456   Delora Fuel, MD  ARIPiprazole (ABILIFY) 5 MG tablet Take 1 tablet (5 mg total) by mouth daily. 123456   Delora Fuel, MD  carvedilol (COREG) 3.125 MG tablet Take 1 tablet (3.125 mg total) by mouth 2 (two) times daily with a meal. 123456   Delora Fuel, MD  folic acid (FOLVITE) 1 MG tablet Take 1 tablet (1 mg total) by mouth daily. 03/08/22   [name], MD  furosemide (LASIX) 40 MG tablet Take 1 tablet (40 mg total) by mouth daily. 123456   Delora Fuel, MD  gabapentin (NEURONTIN) 400 MG capsule Take 1 capsule (400 mg total) by mouth 3 (three) times daily. 123456   Delora Fuel, MD  levETIRAcetam  (KEPPRA) 500 MG tablet Take 1 tablet (500 mg total) by mouth 2 (two) times daily. 123456 0000000  Delora Fuel, MD  oxyCODONE (OXY IR/ROXICODONE) 5 MG immediate release tablet Take 1 tablet (5 mg total) by mouth every 6 (six) hours as needed for severe pain or breakthrough pain. Patient not taking: Reported on [id number]   Delora Fuel, MD  oxyCODONE (OXY IR/ROXICODONE) 5 MG immediate release tablet Take 1 tablet (5 mg total) by mouth every 6 (six) hours as needed for severe pain or breakthrough pain . 123456   Delora Fuel, MD  thiamine 100 MG tablet Take 1 tablet (100 mg total) by mouth daily. 123456   Delora Fuel, MD      Allergies    Motrin [ibuprofen], Tylenol [acetaminophen], Lyrica cr [pregabalin er], and Peanut-containing drug products    Review of Systems   Review of Systems  Physical Exam Updated Vital Signs BP (!) 157/99 (BP Location: Left Arm)   Pulse 98   Temp 97.7 F (36.5 C) (Oral)   Resp 20   Ht 5\' 9"  (1.753 m)   Wt 83 kg   SpO2 99%   BMI 27.02 kg/m   Physical Exam Vitals and nursing note reviewed.  Constitutional:      General: He is not in acute distress.    Appearance: He is well-developed.  HENT:  Head: Normocephalic and atraumatic.  Eyes:     General:        Right eye: No discharge.        Left eye: No discharge.     Conjunctiva/sclera: Conjunctivae normal.  Cardiovascular:     Rate and Rhythm: Normal rate and regular rhythm.     Heart sounds: Normal heart sounds.  Pulmonary:     Effort: Pulmonary effort is normal.     Breath sounds: Examination of the right-middle field reveals decreased breath sounds. Examination of the left-middle field reveals decreased breath sounds. Decreased breath sounds present. No wheezing, rhonchi or rales.     Comments: Diminished lung sounds bilaterally especially mid right and mid left lung fields.  No crackles heard. Abdominal:     Palpations: Abdomen is soft.     Tenderness: There is no abdominal  tenderness.  Musculoskeletal:     Cervical back: Normal range of motion and neck supple.     Left lower leg: Edema present.     Comments: Right lower extremity amputation  Left lower extremity with well granulated chronic appearing wounds over the ankle medially and laterally.  No drainage or discharge.  Remainder of lower extremity is edematous and tense.  No redness, warmth or other signs suggestive of cellulitis.  Skin:    General: Skin is warm and dry.  Neurological:     Mental Status: He is alert.     ED Results / Procedures / Treatments   Labs (all labs ordered are listed, but only abnormal results are displayed) Labs Reviewed  CBC WITH DIFFERENTIAL/PLATELET - Abnormal; Notable for the following components:      Result Value   RBC 3.94 (*)    Hemoglobin 11.6 (*)    HCT 36.5 (*)    RDW 18.3 (*)    All other components within normal limits  COMPREHENSIVE METABOLIC PANEL - Abnormal; Notable for the following components:   CO2 19 (*)    Calcium 8.7 (*)    Albumin 3.4 (*)    All other components within normal limits  BRAIN NATRIURETIC PEPTIDE - Abnormal; Notable for the following components:   B Natriuretic Peptide 2,089.0 (*)    All other components within normal limits  SARS CORONAVIRUS 2 BY RT PCR    EKG None  Radiology DG Ankle Complete Left  Result Date: 07/12/2022 CLINICAL DATA:  Ankle wounds. EXAM: LEFT ANKLE COMPLETE - 3+ VIEW COMPARISON:  None Available. FINDINGS: Apparent skin defect identified laterally. Mild soft tissue swelling. No soft tissue gas identified. No bony erosion. No fractures. IMPRESSION: No evidence of osteomyelitis. No fracture. Apparent skin defect laterally. Electronically Signed   By: Gerome Sam III M.D.   On: 07/12/2022 08:53   DG Foot Complete Left  Result Date: 07/12/2022 CLINICAL DATA:  Foot and ankle wounds. EXAM: LEFT FOOT - COMPLETE 3+ VIEW COMPARISON:  None Available. FINDINGS: The patient is status post amputation of his  great toe. No fractures are identified. No bony erosion to suggest osteomyelitis. No soft tissue gas identified. A pes planus deformity is identified. No other abnormalities. IMPRESSION: No osteomyelitis.  No fractures. Electronically Signed   By: Gerome Sam III M.D.   On: 07/12/2022 08:51   DG Chest 2 View  Result Date: 07/12/2022 CLINICAL DATA:  Shortness of breath. None complaints CHF. Ankle wounds. EXAM: CHEST - 2 VIEW COMPARISON:  None Available. FINDINGS: Focal opacity in the periphery of the right lung not identified on the March 23, 2022 chest  x-ray. No pneumothorax. No overt edema. Stable cardiomegaly. The hila and mediastinum are normal. IMPRESSION: Focal opacity/infiltrate in the periphery of the right mid lung, not present on the March 23, 2022 study. Findings could represent pneumonia. If the patient has signs of infection, recommend treatment with short-term follow-up chest x-ray imaging to ensure resolution. If there are no signs of infection, a CT scan could better evaluate. Cardiomegaly.  No overt edema. Electronically Signed   By: Dorise Bullion III M.D.   On: 07/12/2022 08:48    Procedures Procedures    Medications Ordered in ED Medications  furosemide (LASIX) injection 40 mg (40 mg Intravenous Given 07/12/22 0944)  cefTRIAXone (ROCEPHIN) 1 g in sodium chloride 0.9 % 100 mL IVPB (1 g Intravenous New Bag/Given 07/12/22 0945)  doxycycline (VIBRA-TABS) tablet 100 mg (100 mg Oral Given 07/12/22 0947)  enoxaparin (LOVENOX) injection 40 mg (has no administration in time range)    ED Course/ Medical Decision Making/ A&P    Patient seen and examined. History obtained directly from patient.   Labs/EKG: Ordered CBC, CMP, BNP, EKG  Imaging: Ordered x-ray of the chest, x-ray of the left foot and ankle.  Medications/Fluids: None ordered  Most recent vital signs reviewed and are as follows: BP (!) 157/99 (BP Location: Left Arm)   Pulse 98   Temp 97.7 F (36.5 C) (Oral)    Resp 20   Ht 5\' 9"  (1.753 m)   Wt 83 kg   SpO2 99%   BMI 27.02 kg/m   Initial impression: Shortness of breath in setting of CHF history, chronic appearing lower extremity wounds without overt infection.  Vital signs are reassuring.  Patient is asking for something to eat and drink.  8:16 AM Reviewed EKG with Dr. Truett Mainland, will repeat due to poor quality and difficult interpretation.   10:11 AM Reassessment performed. Patient appears stable.  Labs personally reviewed and interpreted including: CBC with white blood cell count 5.3, hemoglobin 11.6 at baseline; CMP with normal electrolytes, normal kidney function; BNP elevated above baseline to 2089.   Imaging personally visualized and interpreted including: X-ray of the chest showing right midlung infiltrate; x-ray of the ankle and foot negative for fracture or bony involvement.  Reviewed pertinent lab work and imaging with patient at bedside. Questions answered.   Patient discussed with Dr. Truett Mainland.   Most current vital signs reviewed and are as follows: BP (!) 145/116 (BP Location: Left Arm)   Pulse 99   Temp 97.7 F (36.5 C) (Oral)   Resp 20   Ht 5\' 9"  (1.753 m)   Wt 83 kg   SpO2 100%   BMI 27.02 kg/m   Plan: Admit to hospital.   I have ordered IV Rocephin and oral doxycycline to cover for community-acquired pneumonia (patient has prolonged QTc) as well as IV Lasix.   Social determinants of health impacting the patient's care include homelessness, poor adherence to medication, polysubstance abuse.                          Medical Decision Making Amount and/or Complexity of Data Reviewed Labs: ordered. Radiology: ordered.  Risk Prescription drug management.   Patient presents with shortness of breath, no hypoxia.  Work-up does reveal acute on chronic CHF as well as right midlung infiltrate concerning for pneumonia.         Final Clinical Impression(s) / ED Diagnoses Final diagnoses:  SOB (shortness of  breath)  Community acquired pneumonia of right  middle lobe of lung  Acute congestive heart failure, unspecified heart failure type [facility])    Rx / DC Orders ED Discharge Orders     None         Renne Crigler, PA-C 07/12/22 1026    Lonell Grandchild, MD 07/12/22 1032

## 2022-07-12 NOTE — ED Notes (Signed)
Pt refused vitals 

## 2022-07-13 ENCOUNTER — Inpatient Hospital Stay (HOSPITAL_COMMUNITY): Payer: Medicaid Other

## 2022-07-13 DIAGNOSIS — R0602 Shortness of breath: Secondary | ICD-10-CM | POA: Diagnosis present

## 2022-07-13 DIAGNOSIS — F1210 Cannabis abuse, uncomplicated: Secondary | ICD-10-CM | POA: Diagnosis present

## 2022-07-13 DIAGNOSIS — I5023 Acute on chronic systolic (congestive) heart failure: Secondary | ICD-10-CM | POA: Diagnosis not present

## 2022-07-13 DIAGNOSIS — R918 Other nonspecific abnormal finding of lung field: Secondary | ICD-10-CM

## 2022-07-13 DIAGNOSIS — E11649 Type 2 diabetes mellitus with hypoglycemia without coma: Secondary | ICD-10-CM | POA: Diagnosis not present

## 2022-07-13 DIAGNOSIS — I5043 Acute on chronic combined systolic (congestive) and diastolic (congestive) heart failure: Secondary | ICD-10-CM | POA: Diagnosis present

## 2022-07-13 DIAGNOSIS — F10139 Alcohol abuse with withdrawal, unspecified: Secondary | ICD-10-CM | POA: Diagnosis not present

## 2022-07-13 DIAGNOSIS — J440 Chronic obstructive pulmonary disease with acute lower respiratory infection: Secondary | ICD-10-CM | POA: Diagnosis present

## 2022-07-13 DIAGNOSIS — I428 Other cardiomyopathies: Secondary | ICD-10-CM | POA: Diagnosis present

## 2022-07-13 DIAGNOSIS — F250 Schizoaffective disorder, bipolar type: Secondary | ICD-10-CM | POA: Diagnosis present

## 2022-07-13 DIAGNOSIS — J189 Pneumonia, unspecified organism: Secondary | ICD-10-CM | POA: Diagnosis present

## 2022-07-13 DIAGNOSIS — D649 Anemia, unspecified: Secondary | ICD-10-CM

## 2022-07-13 DIAGNOSIS — E1165 Type 2 diabetes mellitus with hyperglycemia: Secondary | ICD-10-CM | POA: Diagnosis not present

## 2022-07-13 DIAGNOSIS — F1910 Other psychoactive substance abuse, uncomplicated: Secondary | ICD-10-CM | POA: Diagnosis not present

## 2022-07-13 DIAGNOSIS — R079 Chest pain, unspecified: Secondary | ICD-10-CM | POA: Diagnosis not present

## 2022-07-13 DIAGNOSIS — E11622 Type 2 diabetes mellitus with other skin ulcer: Secondary | ICD-10-CM | POA: Diagnosis present

## 2022-07-13 DIAGNOSIS — F1410 Cocaine abuse, uncomplicated: Secondary | ICD-10-CM | POA: Diagnosis present

## 2022-07-13 DIAGNOSIS — Z20822 Contact with and (suspected) exposure to covid-19: Secondary | ICD-10-CM | POA: Diagnosis present

## 2022-07-13 DIAGNOSIS — D638 Anemia in other chronic diseases classified elsewhere: Secondary | ICD-10-CM | POA: Diagnosis present

## 2022-07-13 DIAGNOSIS — I248 Other forms of acute ischemic heart disease: Secondary | ICD-10-CM | POA: Diagnosis not present

## 2022-07-13 DIAGNOSIS — I959 Hypotension, unspecified: Secondary | ICD-10-CM | POA: Diagnosis not present

## 2022-07-13 DIAGNOSIS — Z7901 Long term (current) use of anticoagulants: Secondary | ICD-10-CM | POA: Diagnosis not present

## 2022-07-13 DIAGNOSIS — Z89611 Acquired absence of right leg above knee: Secondary | ICD-10-CM | POA: Diagnosis not present

## 2022-07-13 DIAGNOSIS — J9601 Acute respiratory failure with hypoxia: Secondary | ICD-10-CM | POA: Diagnosis not present

## 2022-07-13 DIAGNOSIS — T68XXXA Hypothermia, initial encounter: Secondary | ICD-10-CM | POA: Diagnosis not present

## 2022-07-13 DIAGNOSIS — G928 Other toxic encephalopathy: Secondary | ICD-10-CM

## 2022-07-13 DIAGNOSIS — I69331 Monoplegia of upper limb following cerebral infarction affecting right dominant side: Secondary | ICD-10-CM | POA: Diagnosis not present

## 2022-07-13 DIAGNOSIS — F05 Delirium due to known physiological condition: Secondary | ICD-10-CM | POA: Diagnosis not present

## 2022-07-13 DIAGNOSIS — R001 Bradycardia, unspecified: Secondary | ICD-10-CM | POA: Diagnosis not present

## 2022-07-13 DIAGNOSIS — G9341 Metabolic encephalopathy: Secondary | ICD-10-CM | POA: Diagnosis not present

## 2022-07-13 DIAGNOSIS — L8990 Pressure ulcer of unspecified site, unspecified stage: Secondary | ICD-10-CM | POA: Insufficient documentation

## 2022-07-13 DIAGNOSIS — J441 Chronic obstructive pulmonary disease with (acute) exacerbation: Secondary | ICD-10-CM | POA: Diagnosis present

## 2022-07-13 DIAGNOSIS — L97329 Non-pressure chronic ulcer of left ankle with unspecified severity: Secondary | ICD-10-CM | POA: Diagnosis present

## 2022-07-13 DIAGNOSIS — F1010 Alcohol abuse, uncomplicated: Secondary | ICD-10-CM | POA: Diagnosis not present

## 2022-07-13 DIAGNOSIS — I110 Hypertensive heart disease with heart failure: Secondary | ICD-10-CM | POA: Diagnosis present

## 2022-07-13 DIAGNOSIS — R9431 Abnormal electrocardiogram [ECG] [EKG]: Secondary | ICD-10-CM | POA: Diagnosis not present

## 2022-07-13 LAB — BASIC METABOLIC PANEL
Anion gap: 8 (ref 5–15)
BUN: 17 mg/dL (ref 6–20)
CO2: 20 mmol/L — ABNORMAL LOW (ref 22–32)
Calcium: 8.4 mg/dL — ABNORMAL LOW (ref 8.9–10.3)
Chloride: 113 mmol/L — ABNORMAL HIGH (ref 98–111)
Creatinine, Ser: 1.13 mg/dL (ref 0.61–1.24)
GFR, Estimated: >60 mL/min (ref >60)
Glucose, Bld: 110 mg/dL — ABNORMAL HIGH (ref 70–99)
Potassium: 4.2 mmol/L (ref 3.5–5.1)
Sodium: 141 mmol/L (ref 135–145)

## 2022-07-13 LAB — COOXEMETRY PANEL
Carboxyhemoglobin: 1.7 % — ABNORMAL HIGH (ref 0.5–1.5)
Methemoglobin: <0.7 % (ref 0.0–1.5)
O2 Saturation: 68.8 %
Total hemoglobin: 11.7 g/dL — ABNORMAL LOW (ref 12.0–16.0)

## 2022-07-13 LAB — BLOOD GAS, ARTERIAL
Acid-Base Excess: 4.2 mmol/L — ABNORMAL HIGH (ref 0.0–2.0)
Acid-Base Excess: 2.8 mmol/L — ABNORMAL HIGH (ref 0.0–2.0)
Bicarbonate: 28.4 mmol/L — ABNORMAL HIGH (ref 20.0–28.0)
Bicarbonate: 27.2 mmol/L (ref 20.0–28.0)
Drawn by: 59133
O2 Saturation: 99.6 %
O2 Saturation: 99.6 %
Patient temperature: 37
Patient temperature: 36.4
pCO2 arterial: 40 mmHg (ref 32–48)
pCO2 arterial: 39 mmHg (ref 32–48)
pH, Arterial: 7.46 — ABNORMAL HIGH (ref 7.35–7.45)
pH, Arterial: 7.45 (ref 7.35–7.45)
pO2, Arterial: 205 mmHg — ABNORMAL HIGH (ref 83–108)
pO2, Arterial: 403 mmHg — ABNORMAL HIGH (ref 83–108)

## 2022-07-13 LAB — MRSA NEXT GEN BY PCR, NASAL: MRSA by PCR Next Gen: NOT DETECTED

## 2022-07-13 LAB — URINALYSIS, ROUTINE W REFLEX MICROSCOPIC
Bilirubin Urine: NEGATIVE
Glucose, UA: NEGATIVE mg/dL
Hgb urine dipstick: NEGATIVE
Ketones, ur: NEGATIVE mg/dL
Leukocytes,Ua: NEGATIVE
Nitrite: NEGATIVE
Protein, ur: NEGATIVE mg/dL
Specific Gravity, Urine: 1.005 (ref 1.005–1.030)
pH: 6 (ref 5.0–8.0)

## 2022-07-13 LAB — LACTIC ACID, PLASMA: Lactic Acid, Venous: 0.9 mmol/L (ref 0.5–1.9)

## 2022-07-13 LAB — CBC
HCT: 34.4 % — ABNORMAL LOW (ref 39.0–52.0)
Hemoglobin: 10.9 g/dL — ABNORMAL LOW (ref 13.0–17.0)
MCH: 29.8 pg (ref 26.0–34.0)
MCHC: 31.7 g/dL (ref 30.0–36.0)
MCV: 94 fL (ref 80.0–100.0)
Platelets: 239 10*3/uL (ref 150–400)
RBC: 3.66 MIL/uL — ABNORMAL LOW (ref 4.22–5.81)
RDW: 18.2 % — ABNORMAL HIGH (ref 11.5–15.5)
WBC: 6.1 10*3/uL (ref 4.0–10.5)
nRBC: 0 % (ref 0.0–0.2)

## 2022-07-13 LAB — TROPONIN I (HIGH SENSITIVITY)
Troponin I (High Sensitivity): 45 ng/L — ABNORMAL HIGH (ref <18)
Troponin I (High Sensitivity): 38 ng/L — ABNORMAL HIGH (ref <18)

## 2022-07-13 LAB — PROCALCITONIN: Procalcitonin: <0.1 ng/mL

## 2022-07-13 LAB — AMMONIA: Ammonia: 22 umol/L (ref 9–35)

## 2022-07-13 MED ORDER — LEVETIRACETAM IN NACL 500 MG/100ML IV SOLN
500.0000 mg | Freq: Two times a day (BID) | INTRAVENOUS | Status: DC
  Filled 2022-07-13: qty 100

## 2022-07-13 MED ORDER — CHLORHEXIDINE GLUCONATE CLOTH 2 % EX PADS
6.0000 | MEDICATED_PAD | Freq: Every day | CUTANEOUS | Status: DC
  Administered 2022-07-13 – 2022-07-24 (×9): 6 via TOPICAL

## 2022-07-13 MED ORDER — ADULT MULTIVITAMIN W/MINERALS CH
1.0000 | ORAL_TABLET | Freq: Every day | ORAL | Status: DC
Start: 2022-07-13 — End: 2022-07-14
  Administered 2022-07-13: 1 via ORAL
  Filled 2022-07-13: qty 1

## 2022-07-13 MED ORDER — METOPROLOL TARTRATE 5 MG/5ML IV SOLN: 5.0000 mg | INTRAVENOUS | Status: DC | PRN

## 2022-07-13 MED ORDER — DOCUSATE SODIUM 50 MG/5ML PO LIQD
100.0000 mg | Freq: Two times a day (BID) | ORAL | Status: DC
  Administered 2022-07-15 – 2022-07-17 (×3): 100 mg
  Filled 2022-07-13 (×3): qty 10

## 2022-07-13 MED ORDER — POLYETHYLENE GLYCOL 3350 17 G PO PACK
17.0000 g | PACK | Freq: Every day | ORAL | Status: DC
  Administered 2022-07-15 – 2022-07-17 (×2): 17 g
  Filled 2022-07-13 (×2): qty 1

## 2022-07-13 MED ORDER — BUDESONIDE 0.5 MG/2ML IN SUSP
0.5000 mg | Freq: Two times a day (BID) | RESPIRATORY_TRACT | Status: DC
  Administered 2022-07-13 – 2022-07-25 (×21): 0.5 mg via RESPIRATORY_TRACT
  Filled 2022-07-13 (×24): qty 2

## 2022-07-13 MED ORDER — ORAL CARE MOUTH RINSE
15.0000 mL | OROMUCOSAL | Status: DC
  Administered 2022-07-14 – 2022-07-17 (×33): 15 mL via OROMUCOSAL

## 2022-07-13 MED ORDER — PANTOPRAZOLE SODIUM 40 MG IV SOLR
40.0000 mg | Freq: Two times a day (BID) | INTRAVENOUS | Status: DC
  Administered 2022-07-13 – 2022-07-21 (×12): 40 mg via INTRAVENOUS
  Filled 2022-07-13 (×16): qty 10

## 2022-07-13 MED ORDER — HYDRALAZINE HCL 20 MG/ML IJ SOLN: 10.0000 mg | INTRAMUSCULAR | Status: DC | PRN

## 2022-07-13 MED ORDER — ETOMIDATE 2 MG/ML IV SOLN
INTRAVENOUS | Status: AC
  Administered 2022-07-13: 20 mg
  Filled 2022-07-13: qty 20

## 2022-07-13 MED ORDER — FOLIC ACID 5 MG/ML IJ SOLN
1.0000 mg | Freq: Every day | INTRAMUSCULAR | Status: DC
  Administered 2022-07-14 – 2022-07-17 (×4): 1 mg via INTRAVENOUS
  Filled 2022-07-13 (×10): qty 0.2

## 2022-07-13 MED ORDER — GUAIFENESIN 100 MG/5ML PO LIQD
5.0000 mL | ORAL | Status: DC | PRN
  Administered 2022-07-20 – 2022-07-21 (×2): 5 mL via ORAL
  Filled 2022-07-13 (×3): qty 10

## 2022-07-13 MED ORDER — IPRATROPIUM-ALBUTEROL 0.5-2.5 (3) MG/3ML IN SOLN
3.0000 mL | Freq: Four times a day (QID) | RESPIRATORY_TRACT | Status: DC
  Administered 2022-07-13 – 2022-07-20 (×23): 3 mL via RESPIRATORY_TRACT
  Filled 2022-07-13 (×25): qty 3

## 2022-07-13 MED ORDER — FENTANYL CITRATE PF 50 MCG/ML IJ SOSY
50.0000 ug | PREFILLED_SYRINGE | INTRAMUSCULAR | Status: DC | PRN
  Administered 2022-07-13 – 2022-07-14 (×2): 200 ug via INTRAVENOUS
  Administered 2022-07-14 – 2022-07-15 (×7): 100 ug via INTRAVENOUS
  Administered 2022-07-15 (×2): 200 ug via INTRAVENOUS
  Administered 2022-07-15: 100 ug via INTRAVENOUS
  Administered 2022-07-16 (×3): 200 ug via INTRAVENOUS
  Administered 2022-07-16: 100 ug via INTRAVENOUS
  Administered 2022-07-16: 200 ug via INTRAVENOUS
  Filled 2022-07-13: qty 4

## 2022-07-13 MED ORDER — HYDRALAZINE HCL 10 MG PO TABS: 10.0000 mg | ORAL_TABLET | Freq: Three times a day (TID) | ORAL | Status: DC

## 2022-07-13 MED ORDER — TRAZODONE HCL 50 MG PO TABS: 50.0000 mg | ORAL_TABLET | Freq: Every evening | ORAL | Status: DC | PRN

## 2022-07-13 MED ORDER — FUROSEMIDE 10 MG/ML IJ SOLN
60.0000 mg | Freq: Two times a day (BID) | INTRAMUSCULAR | Status: DC
  Administered 2022-07-13 – 2022-07-14 (×3): 60 mg via INTRAVENOUS
  Filled 2022-07-13 (×3): qty 6

## 2022-07-13 MED ORDER — IPRATROPIUM-ALBUTEROL 0.5-2.5 (3) MG/3ML IN SOLN
3.0000 mL | RESPIRATORY_TRACT | Status: DC | PRN
  Administered 2022-07-18: 3 mL via RESPIRATORY_TRACT
  Filled 2022-07-13 (×3): qty 3

## 2022-07-13 MED ORDER — FENTANYL CITRATE (PF) 100 MCG/2ML IJ SOLN
INTRAMUSCULAR | Status: AC
  Filled 2022-07-13: qty 2

## 2022-07-13 MED ORDER — THIAMINE HCL 100 MG/ML IJ SOLN
100.0000 mg | Freq: Every day | INTRAMUSCULAR | Status: DC
  Administered 2022-07-14 – 2022-07-21 (×5): 100 mg via INTRAVENOUS
  Filled 2022-07-13 (×9): qty 2

## 2022-07-13 MED ORDER — MIDAZOLAM HCL 2 MG/2ML IJ SOLN
INTRAMUSCULAR | Status: AC
  Administered 2022-07-13: 2 mg
  Filled 2022-07-13: qty 2

## 2022-07-13 MED ORDER — MIDAZOLAM-SODIUM CHLORIDE 100-0.9 MG/100ML-% IV SOLN
0.5000 mg/h | INTRAVENOUS | Status: DC
  Administered 2022-07-13: 0.5 mg/h via INTRAVENOUS
  Filled 2022-07-13: qty 100

## 2022-07-13 MED ORDER — FUROSEMIDE 10 MG/ML IJ SOLN
20.0000 mg | Freq: Once | INTRAMUSCULAR | Status: AC
  Administered 2022-07-13: 20 mg via INTRAVENOUS
  Filled 2022-07-13: qty 2

## 2022-07-13 MED ORDER — MIDAZOLAM HCL 2 MG/2ML IJ SOLN
INTRAMUSCULAR | Status: AC
  Administered 2022-07-13: 5 mg via INTRAMUSCULAR
  Filled 2022-07-13: qty 6

## 2022-07-13 MED ORDER — PROPOFOL 1000 MG/100ML IV EMUL
0.0000 ug/kg/min | INTRAVENOUS | Status: DC
  Administered 2022-07-13: 5 ug/kg/min via INTRAVENOUS
  Filled 2022-07-13: qty 100

## 2022-07-13 MED ORDER — FUROSEMIDE 10 MG/ML IJ SOLN
40.0000 mg | Freq: Two times a day (BID) | INTRAMUSCULAR | Status: DC
  Filled 2022-07-13: qty 4

## 2022-07-13 MED ORDER — FENTANYL 2500MCG IN NS 250ML (10MCG/ML) PREMIX INFUSION
50.0000 ug/h | INTRAVENOUS | Status: DC
  Administered 2022-07-13: 50 ug/h via INTRAVENOUS
  Administered 2022-07-14: 200 ug/h via INTRAVENOUS
  Administered 2022-07-14: 100 ug/h via INTRAVENOUS
  Administered 2022-07-15 (×2): 150 ug/h via INTRAVENOUS
  Administered 2022-07-16: 200 ug/h via INTRAVENOUS
  Filled 2022-07-13 (×6): qty 250

## 2022-07-13 MED ORDER — FENTANYL CITRATE PF 50 MCG/ML IJ SOSY: 50.0000 ug | PREFILLED_SYRINGE | INTRAMUSCULAR | Status: DC | PRN

## 2022-07-13 MED ORDER — ROCURONIUM BROMIDE 10 MG/ML (PF) SYRINGE
PREFILLED_SYRINGE | INTRAVENOUS | Status: AC
  Administered 2022-07-13: 80 mg
  Filled 2022-07-13: qty 10

## 2022-07-13 MED ORDER — FENTANYL CITRATE PF 50 MCG/ML IJ SOSY
PREFILLED_SYRINGE | INTRAMUSCULAR | Status: AC
  Administered 2022-07-13: 50 ug via INTRAVENOUS
  Filled 2022-07-13: qty 2

## 2022-07-13 MED ORDER — LORAZEPAM 1 MG PO TABS: 1.0000 mg | ORAL_TABLET | ORAL | Status: DC | PRN

## 2022-07-13 MED ORDER — ORAL CARE MOUTH RINSE: 15.0000 mL | OROMUCOSAL | Status: DC | PRN

## 2022-07-13 MED ORDER — ISOSORBIDE DINITRATE 10 MG PO TABS
10.0000 mg | ORAL_TABLET | Freq: Three times a day (TID) | ORAL | Status: DC
  Filled 2022-07-13: qty 1

## 2022-07-13 MED ORDER — LORAZEPAM 2 MG/ML IJ SOLN
1.0000 mg | INTRAMUSCULAR | Status: DC | PRN
  Administered 2022-07-13 (×2): 2 mg via INTRAVENOUS
  Filled 2022-07-13 (×2): qty 1

## 2022-07-13 NOTE — Progress Notes (Signed)
Called [facility] to let them know that the ETT tube is 7cm above carina. Awaiting orders.

## 2022-07-13 NOTE — Consult Note (Signed)
WOC Nurse Consult Note: Reason for Consult:Wound care to left lateral and medial areas of skin loss, venous insufficiency. Wound type:Venous insufficiency Pressure Injury POA: N/A Measurement:To be measured by Bedside RN, B. Odem-Pressley today and documented on the Nursing Flow Sheet. Wound bed:As noted, pink, yellow, moist (lateral), pink, moist (medial) Drainage (amount, consistency, odor) Lateral with small serosanguinous, medial, none Periwound:intact, dry, edematous Dressing procedure/placement/frequency: Patient has been seen by Dr. Lajoyce Corners in the past, also at the outpatient wound care center, but not in the recent past. Plan has been for topical care and compression. I have implemented a POC using a soap and water cleanse to the left LE, rinse and dry followed by placement of calcium alginate dressings that are known to assist with LE pain. These are to be covered with dry dressings and secured with Kerlix roll gauze topped with a 6-inch ACE bandage. Heel is to be floated in a Prevalon boot. A sacral foam is ordered to the sacrum for PI prevention.  WOC nursing team will not follow, but will remain available to this patient, the nursing and medical teams.  Please re-consult if needed.  Thank you for inviting [place] to participate in this patient's Plan of Care.  Ladona Mow, MSN, RN, CNS, GNP, Leda Min, [employer], [employer] phone:  [phone]

## 2022-07-13 NOTE — Evaluation (Signed)
Occupational Therapy Evaluation Patient Details Name: [name] MRN: [id number] DOB: [birth year] Today's Date: 07/13/2022   History of Present Illness Pt is 56 yo male who presented to ED with with progressively worse dyspnea associated with lower extremity edema, orthopnea, occasional chest pressure sensation and whitish sputum productive cough after being off medications for the past 2 weeks. PMH includes asthma, bipolar 1 disorder, depression, schizoaffective disorder, chronic congestive heart failure, homelessness, GERD, hypertension, QT prolongation, type 2 diabetes, alcohol abuse, cocaine and cannabis abuse, tobacco use, chronic combined systolic and diastolic heart failure, right AKA.   Clinical Impression   Pt admitted with the above diagnoses and presents with below problem list. Pt will benefit from continued acute OT to address the below listed deficits and maximize independence with basic ADLs prior to d/c to venue below. Per chart review, pt was able to transfer to a w/c at baseline; homeless. Pt lethargic at rest, eyes closed throughout session but did awaken and become verbally agitated during bed level pericare. Pt found to be incontinent of bowel and urine at start of session. SpO2 85 on 8L O2, nursing notified. Pt sleeping at end of session.        Recommendations for follow up therapy are one component of a multi-disciplinary discharge planning process, led by the attending physician.  Recommendations may be updated based on patient status, additional functional criteria and insurance authorization.   Follow Up Recommendations  Skilled nursing-short term rehab (<3 hours/day)    Assistance Recommended at Discharge Frequent or constant Supervision/Assistance  Patient can return home with the following Two people to help with walking and/or transfers;Two people to help with bathing/dressing/bathroom;Assistance with cooking/housework;Assistance with feeding;Direct  supervision/assist for medications management;Direct supervision/assist for financial management;Assist for transportation    Functional Status Assessment  Patient has had a recent decline in their functional status and demonstrates the ability to make significant improvements in function in a reasonable and predictable amount of time.  Equipment Recommendations  Other (comment) (defer to next venue)    Recommendations for Other Services       Precautions / Restrictions Precautions Precautions: Fall Precaution Comments: monitor O2 Restrictions Weight Bearing Restrictions: No      Mobility Bed Mobility Overal bed mobility: Needs Assistance Bed Mobility: Rolling Rolling: Max assist         General bed mobility comments: Max A to roll to L and R sides-mostly due to pt's level of arousal/lack of participation/resistance    Transfers                   General transfer comment: unable to assess this session d/t lethargy, verbal combativeness during bed mobility      Balance                                           ADL either performed or assessed with clinical judgement   ADL Overall ADL's : Needs assistance/impaired Eating/Feeding: Set up;Supervision/ safety   Grooming: Set up;Supervision/safety   Upper Body Bathing: Maximal assistance;Total assistance   Lower Body Bathing: Maximal assistance;Total assistance   Upper Body Dressing : Maximal assistance;Total assistance   Lower Body Dressing: Maximal assistance;Total assistance                 General ADL Comments: difficult to fully assess d/t lethargy and verbal combativeness during bed mobility. Bed mobility to change soiled linens.  Pt attempting to use urinal but unable to successfully complete. Also incontinent of bowel.     Vision         Perception     Praxis      Pertinent Vitals/Pain Pain Assessment Pain Assessment: Faces Faces Pain Scale: Hurts even more Pain  Location: LE/foot L Pain Descriptors / Indicators: Moaning, Grimacing Pain Intervention(s): Monitored during session, Limited activity within patient's tolerance     Hand Dominance Right   Extremity/Trunk Assessment Upper Extremity Assessment Upper Extremity Assessment: Difficult to assess due to impaired cognition   Lower Extremity Assessment Lower Extremity Assessment: Defer to PT evaluation       Communication Communication Communication: Expressive difficulties   Cognition Arousal/Alertness: Lethargic, Suspect due to medications Behavior During Therapy: Restless Overall Cognitive Status: Difficult to assess                                 General Comments: On today pt is restless, resistive to assistance, noncompliant with nursing care/wearing O2     General Comments  SpO2 85 on 8L in bed. Nursing notified.    Exercises     Shoulder Instructions      Home Living Family/patient expects to be discharged to:: Shelter/Homeless                                 Additional Comments: Pt generally struggles to enunciate but able to be understood.      Prior Functioning/Environment Prior Level of Function : Needs assist       Physical Assist : Mobility (physical);ADLs (physical)     Mobility Comments: Info taken from last admission: Pt uses WC at baseline but reports having difficulty with propulsion as he cannot use his feet secondary to pain/wounds and using his right UE is becoming more difficult. Pt expresses frustration that he must ask people for assistance for propulsion. Can stand with walker and min assist to ambulate if needed for short distance ADLs Comments: Info taken from last admission: Patient is grossly independent from wheelchair level.        OT Problem List: Decreased strength;Decreased activity tolerance;Impaired balance (sitting and/or standing);Decreased coordination;Decreased cognition;Decreased safety  awareness;Decreased knowledge of use of DME or AE;Decreased knowledge of precautions;Cardiopulmonary status limiting activity;Pain      OT Treatment/Interventions: Self-care/ADL training;Therapeutic exercise;DME and/or AE instruction;Patient/family education;Balance training;Therapeutic activities;Cognitive remediation/compensation    OT Goals(Current goals can be found in the care plan section) Acute Rehab OT Goals Patient Stated Goal: unable to state OT Goal Formulation: Patient unable to participate in goal setting Time For Goal Achievement: 07/27/22 Potential to Achieve Goals: Fair  OT Frequency: Min 2X/week    Co-evaluation PT/OT/SLP Co-Evaluation/Treatment: Yes Reason for Co-Treatment: Necessary to address cognition/behavior during functional activity;For patient/therapist safety;Complexity of the patient's impairments (multi-system involvement)          AM-PAC OT "6 Clicks" Daily Activity     Outcome Measure Help from another person eating meals?: A Little Help from another person taking care of personal grooming?: Total Help from another person toileting, which includes using toliet, bedpan, or urinal?: Total Help from another person bathing (including washing, rinsing, drying)?: Total Help from another person to put on and taking off regular upper body clothing?: Total Help from another person to put on and taking off regular lower body clothing?: Total 6 Click Score: 8   End  of Session Equipment Utilized During Treatment: Oxygen (8L) Nurse Communication: Other (comment) (SpO2 85 on 8L O2)  Activity Tolerance: Patient limited by lethargy;Treatment limited secondary to agitation Patient left: in bed;with call bell/phone within reach;with bed alarm set  OT Visit Diagnosis: Other abnormalities of gait and mobility (R26.89);Muscle weakness (generalized) (M62.81);Other symptoms and signs involving cognitive function;Cognitive communication deficit (R41.841);Pain                 Time: 3244-0102 OT Time Calculation (min): 23 min Charges:  OT General Charges $OT Visit: 1 Visit OT Evaluation $OT Eval Moderate Complexity: 1 Mod  Raynald Kemp, OT Acute Rehabilitation Services Office: [phone]   Pilar Grammes 07/13/2022, 11:48 AM

## 2022-07-13 NOTE — Evaluation (Addendum)
Physical Therapy Evaluation Patient Details Name: [name] MRN: [id number] DOB: 10-Nov-1966 Today's Date: 07/13/2022  History of Present Illness  Pt is 56 yo male who presented to ED with with progressively worse dyspnea associated with lower extremity edema, orthopnea, occasional chest pressure sensation and whitish sputum productive cough after being off medications for the past 2 weeks. PMH includes asthma, bipolar 1 disorder, depression, schizoaffective disorder, chronic congestive heart failure, homelessness, GERD, hypertension, QT prolongation, type 2 diabetes, alcohol abuse, cocaine and cannabis abuse, tobacco use, chronic combined systolic and diastolic heart failure, right AKA.  Clinical Impression  Co-tx with OT. Bed level eval only on today. Pt appears to be experiencing dyspnea and restlessness; linens are soiled with urine/feces.  O2 85% on 8L [place] while in bed. Made RN aware. Called out to front desk for nursing assistance. RN in to assess/assist with patient. RN reports pt has been agitated/combative, noncompliant with wearing O2 and catheter devices. Pt occasionally responds verbally but he can be difficult to understand at times. He was resistive to repositioning while changing bed lines. He was resistive to [place] O2 being repositioned in his nose and he also repeatedly removes [place] O2 despite cues not to. PT eval was very limited on today for the above mentioned reasons. Will plan to follow pt at this time and will continue to assess his ability to participate meaningfully with therapy.       Recommendations for follow up therapy are one component of a multi-disciplinary discharge planning process, led by the attending physician.  Recommendations may be updated based on patient status, additional functional criteria and insurance authorization.  Follow Up Recommendations Skilled nursing-short term rehab (<3 hours/day) Can patient physically be transported by private vehicle: No     Assistance Recommended at Discharge Frequent or constant Supervision/Assistance  Patient can return home with the following  Assist for transportation;Assistance with cooking/housework;A lot of help with bathing/dressing/bathroom;A lot of help with walking and/or transfers    Equipment Recommendations None recommended by PT  Recommendations for Other Services       Functional Status Assessment Patient has had a recent decline in their functional status and demonstrates the ability to make significant improvements in function in a reasonable and predictable amount of time.     Precautions / Restrictions Precautions Precautions: Fall Precaution Comments: monitor O2 Restrictions Weight Bearing Restrictions: No      Mobility  Bed Mobility Overal bed mobility: Needs Assistance Bed Mobility: Rolling Rolling: Max assist         General bed mobility comments: Max A to roll to L and R sides-mostly due to pt's level of arousal/lack of participation/resistance    Transfers                        Ambulation/Gait                  Stairs            Wheelchair Mobility    Modified Rankin (Stroke Patients Only)       Balance                                             Pertinent Vitals/Pain Pain Assessment Pain Assessment: Faces Faces Pain Scale: Hurts even more Pain Location: LE/foot L Pain Descriptors / Indicators: Moaning, Grimacing Pain Intervention(s): Limited activity within  patient's tolerance, Monitored during session    Home Living Family/patient expects to be discharged to:: Shelter/Homeless                   Additional Comments: Pt generally struggles to enunciate but able to be understood.    Prior Function Prior Level of Function : Needs assist             Mobility Comments: Info taken from last admission: Pt uses WC at baseline but reports having difficulty with propulsion as he cannot use his feet  secondary to pain/wounds and using his right UE is becoming more difficult. Pt expresses frustration that he must ask people for assistance for propulsion. Can stand with walker and min assist to ambulate if needed for short distance ADLs Comments: Info taken from last admission: Patient is grossly independent from wheelchair level.     Hand Dominance        Extremity/Trunk Assessment   Upper Extremity Assessment Upper Extremity Assessment: Defer to OT evaluation    Lower Extremity Assessment Lower Extremity Assessment: Difficult to assess due to impaired cognition       Communication   Communication: Expressive difficulties  Cognition Arousal/Alertness: Lethargic, Suspect due to medications Behavior During Therapy: Restless Overall Cognitive Status: Difficult to assess                                 General Comments: On today pt is restless, resistive to assistance, noncompliant with nursing care/wearing O2        General Comments      Exercises     Assessment/Plan    PT Assessment Patient needs continued PT services  PT Problem List Decreased strength;Decreased mobility       PT Treatment Interventions DME instruction;Therapeutic activities;Therapeutic exercise;Patient/family education;Balance training;Functional mobility training;Wheelchair mobility training    PT Goals (Current goals can be found in the Care Plan section)  Acute Rehab PT Goals Patient Stated Goal: none stated PT Goal Formulation: Patient unable to participate in goal setting Time For Goal Achievement: 07/27/22 Potential to Achieve Goals: Fair    Frequency Min 2X/week     Co-evaluation               AM-PAC PT "6 Clicks" Mobility  Outcome Measure Help needed turning from your back to your side while in a flat bed without using bedrails?: Total Help needed moving from lying on your back to sitting on the side of a flat bed without using bedrails?: Total Help needed  moving to and from a bed to a chair (including a wheelchair)?: Total Help needed standing up from a chair using your arms (e.g., wheelchair or bedside chair)?: Total Help needed to walk in hospital room?: Total Help needed climbing 3-5 steps with a railing? : Total 6 Click Score: 6    End of Session   Activity Tolerance: Treatment limited secondary to agitation Patient left: in bed;with call bell/phone within reach;with bed alarm set   PT Visit Diagnosis: Muscle weakness (generalized) (M62.81)    Time: 0940-1003 PT Time Calculation (min) (ACUTE ONLY): 23 min   Charges:   PT Evaluation $PT Eval Moderate Complexity: 1 Mod             Faye Ramsay, PT Acute Rehabilitation  Office: [phone] Pager: [phone]

## 2022-07-13 NOTE — Progress Notes (Signed)
Advised pt to wear BIPAP.  Pt refused. Changed nasal cannula to [place] and increased O2 settings from 6 to 8LPM

## 2022-07-13 NOTE — Procedures (Signed)
Intubation Procedure Note  Burris Matherne  [id number]  Jul 23, 1966  Date:07/13/22  Time:7:46 PM   Provider Performing:[place] Monds Judie Petit Thora Lance    Procedure: Intubation (31500)  Indication(s) Respiratory Failure  Consent Unable to obtain consent due to emergent nature of procedure.   Anesthesia Etomidate, Fentanyl, and Rocuronium   Time Out Verified patient identification, verified procedure, site/side was marked, verified correct patient position, special equipment/implants available, medications/allergies/relevant history reviewed, required imaging and test results available.   Sterile Technique Usual hand hygeine, masks, and gloves were used   Procedure Description Patient positioned in bed supine.  Sedation given as noted above.  Patient was intubated with endotracheal tube using Glidescope.  View was Grade 1 full glottis .  Number of attempts was 1.  Colorimetric CO2 detector was consistent with tracheal placement.   Complications/Tolerance None; patient tolerated the procedure well. Chest X-ray is ordered to verify placement.   EBL Minimal   Specimen(s) None

## 2022-07-13 NOTE — Consult Note (Addendum)
NAME:  [name], MRN:  [id number], DOB:  [birth year], LOS: 0 ADMISSION DATE:  07/12/2022, CONSULTATION DATE:  07/13/22 REFERRING MD:  Nelson Chimes, CHIEF COMPLAINT:  dyspnea   History of Present Illness:  56yM with history of asthma, bipolar 1 disorder, depression, schizoaffective disorder, chronic congestive heart failure, homelessness, GERD, hypertension, QT prolongation, type 2 diabetes, alcohol abuse, cocaine and cannabis abuse, tobacco use, chronic combined systolic and diastolic heart failure, right AKA who presented to ED with gradually worsening dyspnea, LE edema, chest pressure and productive cough.   He was admitted and started on ABX for possible CAP, diuresed for acute on chronic systolic heart failure. Oxygen requirement has risen today from 3L to 8L and he's refused BiPAP.  Agitation requiring a couple doses of IV ativan per CIWA scoring.  Pertinent  Medical History  Asthma BiPD/substance induced mood disturbance CHF GERD DM2 Polysubstance use/alcohol use Homelessness DVT  Significant Hospital Events: Including procedures, antibiotic start and stop dates in addition to other pertinent events   8/12 admitted started on ceftriaxone, doxy, diuresed 8/13 increasing O2 requirement to 8L, refusing bipap, agitation requiring a couple CIWA based doses of ativan  Interim History / Subjective:  Arrived to ICU agitated with no IV access, unable to get reliable pleth possibly in setting low output state  Objective   Blood pressure (!) 134/101, pulse 98, temperature 98.5 F (36.9 C), temperature source Oral, resp. rate 20, height 5\' 9"  (1.753 m), weight 83 kg, SpO2 97 %.        Intake/Output Summary (Last 24 hours) at 07/13/2022 1802 Last data filed at 07/13/2022 1430 Gross per 24 hour  Intake 360 ml  Output 750 ml  Net -390 ml   Filed Weights   07/12/22 0733  Weight: 83 kg    Examination: General appearance: 56 y.o., male, alternating agitated/drowsy  Eyes: PERRL, tracking  appropriately HENT: NCAT; dry MM Neck: Trachea midline; no lymphadenopathy, no JVD Lungs: rhonchi bl, with increased respiratory effort CV: tachy RR, no murmur  Abdomen: Soft, non-tender; non-distended, BS present  Extremities: trace peripheral edema, warm Neuro: Alert and oriented to person and place, not following commands but moving all ext equally   Resolved Hospital Problem list   N/a  Assessment & Plan:   # Acute hypoxic respiratory failure # Right mid-lung opacity Doesn't necessarily seem to have a ton of pulmonary edema on CXR 8/12. Questionable CAP vs wedge shaped infarct from PE vs atelectasis. He appears surprisingly dry though during central line placement. - check CXR - repeat EKG, trop - full vent support - fentanyl prn, propofol for rass -1 - VAP bundle - continue ceftriaxone, doxy for now - trach aspirate  # Acute on chronic systolic and diastolic heart failure # CAD - f/u coox, lactic - hold BB, afterload reduction as we assess response to propofol but may need to either add this back or start inotrope - will continue lasix 60 BID for now, seems responsive to this dose but as above looks surprisingly dry on exam, may need to reassess pending labs in AM - tte   # Possible Asthma-COPD overlap - bronchodilators  # Acute toxic and metabolic encephalopathy Substance/etoh withdrawal, low output state possible. - check UA, ammonia - thiamine  # Anemia of chronic disease - trend cbc   Best Practice (right click and "Reselect all SmartList Selections" daily)   Per [facility]   Labs   CBC: Recent Labs  Lab 07/12/22 0750 07/13/22 0516  WBC 5.3 6.1  NEUTROABS  3.0  --   HGB 11.6* 10.9*  HCT 36.5* 34.4*  MCV 92.6 94.0  PLT 238 239    Basic Metabolic Panel: Recent Labs  Lab 07/12/22 0750 07/13/22 0516  NA 138 141  K 4.0 4.2  CL 111 113*  CO2 19* 20*  GLUCOSE 92 110*  BUN 13 17  CREATININE 1.09 1.13  CALCIUM 8.7* 8.4*   GFR: Estimated Creatinine  Clearance: 74.7 mL/min (by C-G formula based on SCr of 1.13 mg/dL). Recent Labs  Lab 07/12/22 0750 07/13/22 0516  PROCALCITON  --  <0.10  WBC 5.3 6.1    Liver Function Tests: Recent Labs  Lab 07/12/22 0750  AST 24  ALT 16  ALKPHOS 116  BILITOT 1.1  PROT 7.1  ALBUMIN 3.4*   No results for input(s): "LIPASE", "AMYLASE" in the last 168 hours. No results for input(s): "AMMONIA" in the last 168 hours.  ABG    Component Value Date/Time   PHART 7.367 12/17/2020 1624   PCO2ART 42.2 12/17/2020 1624   PO2ART 107 12/17/2020 1624   HCO3 24.6 12/17/2020 1624   TCO2 22 01/29/2022 1027   ACIDBASEDEF 1.0 12/17/2020 1624   O2SAT 98.0 12/17/2020 1624     Coagulation Profile: No results for input(s): "INR", "PROTIME" in the last 168 hours.  Cardiac Enzymes: No results for input(s): "CKTOTAL", "CKMB", "CKMBINDEX", "TROPONINI" in the last 168 hours.  HbA1C: Hgb A1c MFr Bld  Date/Time Value Ref Range Status  03/24/2022 08:07 AM 7.0 (H) 4.8 - 5.6 % Final    Comment:    (NOTE) Pre diabetes:          5.7%-6.4%  Diabetes:              >6.4%  Glycemic control for   <7.0% adults with diabetes   11/13/2021 01:01 PM 5.8 (H) 4.8 - 5.6 % Final    Comment:    (NOTE) Pre diabetes:          5.7%-6.4%  Diabetes:              >6.4%  Glycemic control for   <7.0% adults with diabetes     CBG: No results for input(s): "GLUCAP" in the last 168 hours.  Review of Systems:   Unable to obtain in setting of his acute encephalopathy  Past Medical History:  He,  has a past medical history of Asthma, Bipolar 1 disorder (HCC), CHF (congestive heart failure) (HCC), COPD (chronic obstructive pulmonary disease) (HCC), Depression, Diabetes mellitus without complication (HCC), GERD (gastroesophageal reflux disease), Homelessness, Hypertension, QT prolongation, and Schizoaffective disorder (HCC).   Surgical History:   Past Surgical History:  Procedure Laterality Date   AMPUTATION TOE Bilateral  01/02/2021   Procedure: AMPUTATION TOE;  Surgeon: Park Liter, DPM;  Location: [facility] OR;  Service: Podiatry;  Laterality: Bilateral;  Ok to leave patient on hospital bed   IR ANGIO INTRA EXTRACRAN SEL COM CAROTID INNOMINATE BILAT MOD SED  12/17/2020   IR ANGIO VERTEBRAL SEL VERTEBRAL BILAT MOD SED  12/17/2020   RADIOLOGY WITH ANESTHESIA N/A 12/17/2020   Procedure: IR WITH ANESTHESIA;  Surgeon: Radiologist, Medication, MD;  Location: [facility] OR;  Service: Radiology;  Laterality: N/A;     Social History:   reports that he has been smoking cigarettes. He has never used smokeless tobacco. He reports current alcohol use. He reports current drug use. Drug: Cocaine.   Family History:  His family history includes Drug abuse in his mother; Suicidality in his mother.   Allergies  Allergies  Allergen Reactions   Motrin [Ibuprofen] Anaphylaxis   Tylenol [Acetaminophen] Hives, Nausea And Vomiting and Rash   Lyrica Cr [Pregabalin Er] Other (See Comments)    Dystonia Dysarthria    Peanut-Containing Drug Products Other (See Comments)    Pt denies allergy to peanut butter 03/02/2021     Home Medications  Prior to Admission medications   Medication Sig Start Date End Date Taking? Authorizing Provider  albuterol (VENTOLIN HFA) 108 (90 Base) MCG/ACT inhaler Inhale 2 puffs into the lungs every 6 (six) hours as needed for wheezing or shortness of breath. Patient not taking: Reported on 07/12/2022 03/07/22   [name], MD  apixaban (ELIQUIS) 5 MG TABS tablet Take 1 tablet (5 mg total) by mouth 2 (two) times daily. Patient not taking: Reported on [id number]   Delora Fuel, MD  ARIPiprazole (ABILIFY) 5 MG tablet Take 1 tablet (5 mg total) by mouth daily. Patient not taking: Reported on [id number]   Delora Fuel, MD  carvedilol (COREG) 3.125 MG tablet Take 1 tablet (3.125 mg total) by mouth 2 (two) times daily with a meal. Patient not taking: Reported on [id number]   Delora Fuel, MD  folic  acid (FOLVITE) 1 MG tablet Take 1 tablet (1 mg total) by mouth daily. Patient not taking: Reported on 07/12/2022 03/08/22   [name], MD  furosemide (LASIX) 40 MG tablet Take 1 tablet (40 mg total) by mouth daily. Patient not taking: Reported on [id number]   Delora Fuel, MD  gabapentin (NEURONTIN) 400 MG capsule Take 1 capsule (400 mg total) by mouth 3 (three) times daily. Patient not taking: Reported on [id number]   Delora Fuel, MD  levETIRAcetam (KEPPRA) 500 MG tablet Take 1 tablet (500 mg total) by mouth 2 (two) times daily. Patient not taking: Reported on 07/12/2022 [id number] 0000000  Delora Fuel, MD  oxyCODONE (OXY IR/ROXICODONE) 5 MG immediate release tablet Take 1 tablet (5 mg total) by mouth every 6 (six) hours as needed for severe pain or breakthrough pain. Patient not taking: Reported on [id number]   Delora Fuel, MD  oxyCODONE (OXY IR/ROXICODONE) 5 MG immediate release tablet Take 1 tablet (5 mg total) by mouth every 6 (six) hours as needed for severe pain or breakthrough pain . Patient not taking: Reported on [id number]   Delora Fuel, MD  thiamine 100 MG tablet Take 1 tablet (100 mg total) by mouth daily. Patient not taking: Reported on [id number]   Delora Fuel, MD     Critical care time: 34 minutes

## 2022-07-13 NOTE — Progress Notes (Signed)
eLink Physician-Brief Progress Note Patient Name: Jeiden Daughtridge DOB: 1966-07-08 MRN: [id number]   Date of Service  07/13/2022  HPI/Events of Note  Just intubated at change of shift. Bedside is asking for more sedation as patient is familiar to her. Also for restraints. Temp foley in place. Having PVCs and dropped beats, prolonged QT Received request to shift Keppra to IV since having coffee ground/blood tinged OGT output  eICU Interventions  Fentanyl drip and bilateral wrist restraints ordered. Bedside team to reassess in AM when restraints and sedation can be safely discontinued. Will hold off on Keppra given arrhythmias and prolonged QT Troponin 45 which is at his baseline and compatible with HF. Will trend. Responding to diuresis Start pantoprazole IV Discussed with bedside RN        Darl Pikes 07/13/2022, 9:03 PM

## 2022-07-13 NOTE — Progress Notes (Signed)
RN took pt off BIPAP per pt request. Patient is sleeping at this time.

## 2022-07-13 NOTE — Progress Notes (Signed)
ABG completed , called and sent to lab 

## 2022-07-13 NOTE — Procedures (Signed)
Central Venous Catheter Insertion Procedure Note  Vinal Rosengrant  [id number]  Apr 15, 1966  Date:07/13/22  Time:7:47 PM   Provider Performing:Garnet Overfield Judie Petit Thora Lance   Procedure: Insertion of Non-tunneled Central Venous Catheter(36556) with US guidance (73403)   Indication(s) Medication administration and Difficult access  Consent Unable to obtain consent due to emergent nature of procedure.  Anesthesia Topical only with 1% lidocaine   Timeout Verified patient identification, verified procedure, site/side was marked, verified correct patient position, special equipment/implants available, medications/allergies/relevant history reviewed, required imaging and test results available.  Sterile Technique Maximal sterile technique including full sterile barrier drape, hand hygiene, sterile gown, sterile gloves, mask, hair covering, sterile ultrasound probe cover (if used).  Procedure Description Area of catheter insertion was cleaned with chlorhexidine and draped in sterile fashion.  With real-time ultrasound guidance a central venous catheter was placed into the left subclavian vein. Nonpulsatile blood flow and easy flushing noted in all ports.  The catheter was sutured in place and sterile dressing applied.  Complications/Tolerance None; patient tolerated the procedure well. Chest X-ray is ordered to verify placement for internal jugular or subclavian cannulation.   Chest x-ray is not ordered for femoral cannulation.  EBL Minimal  Specimen(s) None

## 2022-07-13 NOTE — Progress Notes (Signed)
eLink Physician-Brief Progress Note Patient Name: [name] DOB: Jul 03, 1966 MRN: [id number]   Date of Service  07/13/2022  HPI/Events of Note  CXR with ETT 7 cm above carina  eICU Interventions  Discussed with BSRN to have ETT pushed in by 3 cm. RT to be called     Intervention Category Intermediate Interventions: Diagnostic test evaluation  Darl Pikes 07/13/2022, 10:46 PM

## 2022-07-13 NOTE — Progress Notes (Addendum)
Advanced tube 3cm per orders. ETT tube secured at 28 cm. RN aware. Sputum taken and send to lab.

## 2022-07-13 NOTE — Progress Notes (Addendum)
PROGRESS NOTE    [name]  GQQ:[id number] DOB: Sep 22, 1966 DOA: 07/12/2022 PCP: [name], NP   Brief Narrative:   56 year old with history of asthma, bipolar 1, depression, schizoaffective disorder, chronic systolic CHF <20%, GERD, HTN, DM 2, alcohol abuse, polysubstance abuse, right AKA comes to the hospital with worsening dyspnea and diagnosed with CHF exacerbation.  He had elevated BNP, chest x-ray showed opacities.  Initially started on IV Lasix, Rocephin and doxycycline.  Assessment & Plan:  Principal Problem:   Acute on chronic systolic congestive heart failure (HCC) Active Problems:   COPD with acute exacerbation (HCC)   Major depressive disorder, recurrent episode (HCC)   Alcohol use disorder, mild, abuse   Homelessness   Schizoaffective disorder, bipolar type (HCC)   Hyperlipidemia   Prolonged QT interval   History of DVT (deep vein thrombosis)   CAP (community acquired pneumonia)   Pressure injury of skin     Acute on chronic systolic congestive heart failure (HCC), class IV.  EF less than 20% -Previous echocardiogram showed patient's EF less than 20%.  Continue Lasix 40 mg IV twice daily.  Fluid restriction, daily weights. REFUSING BIPAP - On Eliquis, Coreg, lisinopril -Repeat echo  ADDENDUM 545PM:  Patient continues to have increase in O2 requirement and with desaturations. Will increase his diuretics to 60mg  iv bid. He continues to refuse BiPAP. ABG ordered. Will transfer [facility] for closer monitoring. Low threshold for intubation if continues to decline.  ProCal is neg, will stop Abx as well.  -Inbox message to cardiology sent to see the ptn tomorrow.     COPD with acute exacerbation (HCC) -Scheduled and as needed bronchodilators     CAP (community acquired pneumonia) -Check procalcitonin - On empiric Rocephin/doxycycline.  Check strep pneumo and urine antigen, sputum culture. - Bronchodilators, I-S/flutter valve     Alcohol use  disorder, mild, abuse -Monitor for signs of withdrawal.  We will place him on CIWA if necessary     Homelessness Consult TOC.     Schizoaffective disorder, bipolar type (HCC)   Major depressive disorder, recurrent episode (HCC) -On Abilify 5 mg daily     Hyperlipidemia -Not on any home meds     Prolonged QT interval Keep electrolytes optimized. Avoid QT prolonging meds.     History of deep vein thrombosis (DVT) Eliquis twice daily        DVT prophylaxis: Eliquis Code Status: Full Code Family Communication:    Significantly vol overload needing IV lasix.     Subjective: Orthopnea and DOE with minimal activity.  He continues to refuse BiPAP He is being very offensive verbally and refusing any treatment at this time will help him.   Examination:  General exam: Appears calm and comfortable 8 L  Respiratory system: b/l diminished BS. Tachypnea. Cardiovascular system: S1 & S2 heard, RRR. 1+ LE edema of LLE Gastrointestinal system: Abdomen is nondistended, soft and nontender. No organomegaly or masses felt. Normal bowel sounds heard. Central nervous system: Alert and oriented. No focal neurological deficits. Extremities:  Missing toes. Right aka Skin: No rashes, lesions or ulcers Psychiatry: Judgement and insight appear normal. Mood & affect appropriate.     Objective: Vitals:   07/12/22 1931 07/13/22 0018 07/13/22 0615 07/13/22 0827  BP:  126/87 (!) 124/91 (!) 130/102  Pulse:  85 88 (!) 101  Resp:  19 17 (!) 21  Temp:  97.6 F (36.4 C) 97.9 F (36.6 C) 98.3 F (36.8 C)  TempSrc:  Oral  Oral  SpO2: 99% 100% 100% 100%  Weight:      Height:        Intake/Output Summary (Last 24 hours) at 07/13/2022 0845 Last data filed at 07/12/2022 1930 Gross per 24 hour  Intake 100 ml  Output 1750 ml  Net -1650 ml   Filed Weights   07/12/22 0733  Weight: 83 kg     Data Reviewed:   CBC: Recent Labs  Lab 07/12/22 0750 07/13/22 0516  WBC 5.3 6.1  NEUTROABS  3.0  --   HGB 11.6* 10.9*  HCT 36.5* 34.4*  MCV 92.6 94.0  PLT 238 [id number]   Basic Metabolic Panel: Recent Labs  Lab 07/12/22 0750 07/13/22 0516  NA 138 141  K 4.0 4.2  CL 111 113*  CO2 19* 20*  GLUCOSE 92 110*  BUN 13 17  CREATININE 1.09 1.13  CALCIUM 8.7* 8.4*   GFR: Estimated Creatinine Clearance: 74.7 mL/min (by C-G formula based on SCr of 1.13 mg/dL). Liver Function Tests: Recent Labs  Lab 07/12/22 0750  AST 24  ALT 16  ALKPHOS 116  BILITOT 1.1  PROT 7.1  ALBUMIN 3.4*   No results for input(s): "LIPASE", "AMYLASE" in the last 168 hours. No results for input(s): "AMMONIA" in the last 168 hours. Coagulation Profile: No results for input(s): "INR", "PROTIME" in the last 168 hours. Cardiac Enzymes: No results for input(s): "CKTOTAL", "CKMB", "CKMBINDEX", "TROPONINI" in the last 168 hours. BNP (last 3 results) No results for input(s): "PROBNP" in the last 8760 hours. HbA1C: No results for input(s): "HGBA1C" in the last 72 hours. CBG: No results for input(s): "GLUCAP" in the last 168 hours. Lipid Profile: No results for input(s): "CHOL", "HDL", "LDLCALC", "TRIG", "CHOLHDL", "LDLDIRECT" in the last 72 hours. Thyroid Function Tests: No results for input(s): "TSH", "T4TOTAL", "FREET4", "T3FREE", "THYROIDAB" in the last 72 hours. Anemia Panel: No results for input(s): "VITAMINB12", "FOLATE", "FERRITIN", "TIBC", "IRON", "RETICCTPCT" in the last 72 hours. Sepsis Labs: No results for input(s): "PROCALCITON", "LATICACIDVEN" in the last 168 hours.  Recent Results (from the past 240 hour(s))  SARS Coronavirus 2 by RT PCR (hospital order, performed in [facility] hospital lab) *cepheid single result test* Anterior Nasal Swab     Status: None   Collection Time: 07/12/22 10:49 AM   Specimen: Anterior Nasal Swab  Result Value Ref Range Status   SARS Coronavirus 2 by RT PCR NEGATIVE NEGATIVE Final    Comment: (NOTE) SARS-CoV-2 target nucleic acids are NOT DETECTED.  The  SARS-CoV-2 RNA is generally detectable in upper and lower respiratory specimens during the acute phase of infection. The lowest concentration of SARS-CoV-2 viral copies this assay can detect is 250 copies / mL. A negative result does not preclude SARS-CoV-2 infection and should not be used as the sole basis for treatment or other patient management decisions.  A negative result may occur with improper specimen collection / handling, submission of specimen other than nasopharyngeal swab, presence of viral mutation(s) within the areas targeted by this assay, and inadequate number of viral copies (<250 copies / mL). A negative result must be combined with clinical observations, patient history, and epidemiological information.  Fact Sheet for Patients:   https://www.patel.info/  Fact Sheet for Healthcare Providers: https://hall.com/  This test is not yet approved or  cleared by the [place] FDA and has been authorized for detection and/or diagnosis of SARS-CoV-2 by FDA under an Emergency Use Authorization (EUA).  This EUA will remain in effect (meaning this test can be  used) for the duration of the COVID-19 declaration under Section 564(b)(1) of the Act, 21 U.S.C. section 360bbb-3(b)(1), unless the authorization is terminated or revoked sooner.  Performed at [facility], [address] [place], [name] [address]          Radiology Studies: DG Ankle Complete Left  Result Date: 07/12/2022 CLINICAL DATA:  Ankle wounds. EXAM: LEFT ANKLE COMPLETE - 3+ VIEW COMPARISON:  None Available. FINDINGS: Apparent skin defect identified laterally. Mild soft tissue swelling. No soft tissue gas identified. No bony erosion. No fractures. IMPRESSION: No evidence of osteomyelitis. No fracture. Apparent skin defect laterally. Electronically Signed   By: Gerome Sam III M.D.   On: 07/12/2022 08:53   DG Foot Complete Left  Result  Date: 07/12/2022 CLINICAL DATA:  Foot and ankle wounds. EXAM: LEFT FOOT - COMPLETE 3+ VIEW COMPARISON:  None Available. FINDINGS: The patient is status post amputation of his great toe. No fractures are identified. No bony erosion to suggest osteomyelitis. No soft tissue gas identified. A pes planus deformity is identified. No other abnormalities. IMPRESSION: No osteomyelitis.  No fractures. Electronically Signed   By: Gerome Sam III M.D.   On: 07/12/2022 08:51   DG Chest 2 View  Result Date: 07/12/2022 CLINICAL DATA:  Shortness of breath. None complaints CHF. Ankle wounds. EXAM: CHEST - 2 VIEW COMPARISON:  None Available. FINDINGS: Focal opacity in the periphery of the right lung not identified on the March 23, 2022 chest x-ray. No pneumothorax. No overt edema. Stable cardiomegaly. The hila and mediastinum are normal. IMPRESSION: Focal opacity/infiltrate in the periphery of the right mid lung, not present on the March 23, 2022 study. Findings could represent pneumonia. If the patient has signs of infection, recommend treatment with short-term follow-up chest x-ray imaging to ensure resolution. If there are no signs of infection, a CT scan could better evaluate. Cardiomegaly.  No overt edema. Electronically Signed   By: Gerome Sam III M.D.   On: 07/12/2022 08:48        Scheduled Meds:  apixaban  5 mg Oral BID   ARIPiprazole  5 mg Oral Daily   budesonide (PULMICORT) nebulizer solution  0.5 mg Nebulization Q12H   carvedilol  3.125 mg Oral BID   doxycycline  100 mg Oral Q12H   folic acid  1 mg Oral Daily   furosemide  20 mg Intravenous BID   gabapentin  400 mg Oral TID   levETIRAcetam  500 mg Oral BID   lisinopril  2.5 mg Oral Daily   multivitamin with minerals  1 tablet Oral Daily   potassium chloride  20 mEq Oral Daily   thiamine  100 mg Oral Daily   Continuous Infusions:  cefTRIAXone (ROCEPHIN)  IV       LOS: 0 days   Time spent= 35 mins    Hilary Pundt Joline Maxcy, MD [facility]  If 7PM-7AM, please contact night-coverage  07/13/2022, 8:45 AM

## 2022-07-13 NOTE — Progress Notes (Signed)
RT placed patient on BIPAP due to sats in the low 80's and increased WOB. Settings are 14/5  45%. Patient is sating at 99%.Patient is tolerating well.

## 2022-07-13 NOTE — Progress Notes (Signed)
ABG collected and send down to lab for analysis. Not enough secretions for the sputum. Will try again. Lab notified.

## 2022-07-13 NOTE — Progress Notes (Signed)
eLink Physician-Brief Progress Note Patient Name: [name] DOB: 1966-02-07 MRN: [id number]   Date of Service  07/13/2022  HPI/Events of Note  Propofol lowering BP Given Versed earlier and did well  eICU Interventions  Ordered Versed drip for sedation Discussed with bedside RN     Intervention Category Minor Interventions: Agitation / anxiety - evaluation and management  Darl Pikes 07/13/2022, 10:05 PM

## 2022-07-14 ENCOUNTER — Encounter (HOSPITAL_COMMUNITY): Payer: Self-pay | Admitting: Internal Medicine

## 2022-07-14 ENCOUNTER — Inpatient Hospital Stay (HOSPITAL_COMMUNITY): Payer: Medicaid Other

## 2022-07-14 DIAGNOSIS — J9601 Acute respiratory failure with hypoxia: Secondary | ICD-10-CM | POA: Diagnosis not present

## 2022-07-14 DIAGNOSIS — I5023 Acute on chronic systolic (congestive) heart failure: Secondary | ICD-10-CM | POA: Diagnosis not present

## 2022-07-14 DIAGNOSIS — I5043 Acute on chronic combined systolic (congestive) and diastolic (congestive) heart failure: Secondary | ICD-10-CM | POA: Diagnosis not present

## 2022-07-14 DIAGNOSIS — I428 Other cardiomyopathies: Secondary | ICD-10-CM

## 2022-07-14 DIAGNOSIS — F1910 Other psychoactive substance abuse, uncomplicated: Secondary | ICD-10-CM | POA: Diagnosis not present

## 2022-07-14 LAB — BASIC METABOLIC PANEL
Anion gap: 8 (ref 5–15)
BUN: 14 mg/dL (ref 6–20)
CO2: 27 mmol/L (ref 22–32)
Calcium: 8.1 mg/dL — ABNORMAL LOW (ref 8.9–10.3)
Chloride: 106 mmol/L (ref 98–111)
Creatinine, Ser: 1.16 mg/dL (ref 0.61–1.24)
GFR, Estimated: >60 mL/min (ref >60)
Glucose, Bld: 81 mg/dL (ref 70–99)
Potassium: 3.5 mmol/L (ref 3.5–5.1)
Sodium: 141 mmol/L (ref 135–145)

## 2022-07-14 LAB — GLUCOSE, CAPILLARY
Glucose-Capillary: 14 mg/dL — CL (ref 70–99)
Glucose-Capillary: 31 mg/dL — CL (ref 70–99)
Glucose-Capillary: 42 mg/dL — CL (ref 70–99)
Glucose-Capillary: 90 mg/dL (ref 70–99)

## 2022-07-14 LAB — CBC
HCT: 34.8 % — ABNORMAL LOW (ref 39.0–52.0)
Hemoglobin: 11.2 g/dL — ABNORMAL LOW (ref 13.0–17.0)
MCH: 29.6 pg (ref 26.0–34.0)
MCHC: 32.2 g/dL (ref 30.0–36.0)
MCV: 92.1 fL (ref 80.0–100.0)
Platelets: 240 10*3/uL (ref 150–400)
RBC: 3.78 MIL/uL — ABNORMAL LOW (ref 4.22–5.81)
RDW: 18.1 % — ABNORMAL HIGH (ref 11.5–15.5)
WBC: 8.9 10*3/uL (ref 4.0–10.5)
nRBC: 0 % (ref 0.0–0.2)

## 2022-07-14 LAB — MAGNESIUM: Magnesium: 1.4 mg/dL — ABNORMAL LOW (ref 1.7–2.4)

## 2022-07-14 LAB — ECHOCARDIOGRAM COMPLETE
Area-P 1/2: 5.23 cm2
Calc EF: 15.6 %
Height: 69 in
S' Lateral: 5.6 cm
Single Plane A2C EF: 9.5 %
Single Plane A4C EF: 21.8 %
Weight: 2666.68 oz

## 2022-07-14 LAB — TRIGLYCERIDES: Triglycerides: 52 mg/dL (ref <150)

## 2022-07-14 LAB — PHOSPHORUS: Phosphorus: 3.9 mg/dL (ref 2.5–4.6)

## 2022-07-14 MED ORDER — SODIUM CHLORIDE 0.9 % IV SOLN
1.0000 g | Freq: Two times a day (BID) | INTRAVENOUS | Status: AC
  Administered 2022-07-14 – 2022-07-16 (×6): 1 g via INTRAVENOUS
  Filled 2022-07-14 (×6): qty 10

## 2022-07-14 MED ORDER — DEXTROSE 50 % IV SOLN
25.0000 g | INTRAVENOUS | Status: AC
  Administered 2022-07-14: 25 g via INTRAVENOUS

## 2022-07-14 MED ORDER — MAGNESIUM SULFATE 4 GM/100ML IV SOLN
4.0000 g | Freq: Once | INTRAVENOUS | Status: AC
Start: 2022-07-14 — End: 2022-07-14
  Administered 2022-07-14: 4 g via INTRAVENOUS
  Filled 2022-07-14: qty 100

## 2022-07-14 MED ORDER — DEXTROSE 50 % IV SOLN
INTRAVENOUS | Status: AC
  Filled 2022-07-14: qty 50

## 2022-07-14 MED ORDER — LACTATED RINGERS IV BOLUS
1000.0000 mL | Freq: Once | INTRAVENOUS | Status: AC
  Administered 2022-07-14: 1000 mL via INTRAVENOUS

## 2022-07-14 MED ORDER — DEXMEDETOMIDINE HCL IN NACL 400 MCG/100ML IV SOLN
0.4000 ug/kg/h | INTRAVENOUS | Status: DC
  Administered 2022-07-16: 1.2 ug/kg/h via INTRAVENOUS
  Administered 2022-07-16: 0.4 ug/kg/h via INTRAVENOUS
  Administered 2022-07-16: 1.5 ug/kg/h via INTRAVENOUS
  Filled 2022-07-14 (×3): qty 100

## 2022-07-14 MED ORDER — POTASSIUM CHLORIDE 10 MEQ/50ML IV SOLN
10.0000 meq | INTRAVENOUS | Status: AC
  Administered 2022-07-14 (×4): 10 meq via INTRAVENOUS
  Filled 2022-07-14 (×4): qty 50

## 2022-07-14 MED ORDER — ADULT MULTIVITAMIN W/MINERALS CH
1.0000 | ORAL_TABLET | Freq: Every day | ORAL | Status: DC
  Administered 2022-07-15 – 2022-07-17 (×2): 1
  Filled 2022-07-14 (×2): qty 1

## 2022-07-14 MED ORDER — WHITE PETROLATUM EX OINT
TOPICAL_OINTMENT | CUTANEOUS | Status: DC | PRN
  Filled 2022-07-14: qty 5

## 2022-07-14 MED ORDER — SODIUM CHLORIDE 0.9 % IV SOLN
100.0000 mg | Freq: Two times a day (BID) | INTRAVENOUS | Status: AC
  Administered 2022-07-14 – 2022-07-16 (×6): 100 mg via INTRAVENOUS
  Filled 2022-07-14 (×6): qty 100

## 2022-07-14 MED ORDER — DEXTROSE IN LACTATED RINGERS 5 % IV SOLN: INTRAVENOUS | Status: DC

## 2022-07-14 NOTE — Progress Notes (Signed)
  Echocardiogram 2D Echocardiogram has been performed.  [name] 07/14/2022, 9:18 AM

## 2022-07-14 NOTE — Progress Notes (Signed)
eLink Physician-Brief Progress Note Patient Name: [name] DOB: 05-Mar-1966 MRN: [id number]   Date of Service  07/14/2022  HPI/Events of Note  Notified of temp 100.2 Antibiotics discontinued as by bedside team as procal normal. On CCM consult plan is to continue antibiotics started in the ED CXR persistent opacity right lateral lung base  eICU Interventions  Resume ceftriaxone and doxycycline Continue physical measures for cooling as with allergies to acetaminophen and NSAIDS. Confirmed with pharmacy        [name] T [name] 07/14/2022, 3:13 AM

## 2022-07-14 NOTE — Progress Notes (Signed)
Initial Nutrition Assessment  DOCUMENTATION CODES:   Not applicable  INTERVENTION:  - once able to initiate TF: recommend Osmolite 1.5 @ 20 ml/hr to advance by 10 ml every 8 hours to reach goal rate of 50 ml/hr with 60 ml Prosource TF20 BID.  - at goal rate, this regimen will provide 1960 kcal, 115 grams protein, and 914 ml free water.   NUTRITION DIAGNOSIS:   Inadequate oral intake related to inability to eat as evidenced by NPO status.  GOAL:   Patient will meet greater than or equal to 90% of their needs  MONITOR:   Vent status, Labs, Weight trends, I & O's  REASON FOR ASSESSMENT:   Ventilator  ASSESSMENT:   56 year-old male with medical history of asthma, bipolar 1 disorder, depression, schizoaffective disorder, CHF, homelessness, GERD, HTN, QT prolongation, type 2 DM, alcohol abuse, cocaine and cannabis abuse, tobacco use, and R AKA. He presented to the ED due to gradually worsening dyspnea, LE edema, chest pressure, and productive cough. He was admitted due to need for abx for CAP and diuresis d/t acute on chronic CHF. He initially required 3L O2 and O2 requirement increased to 8L; patient refused BiPAP. He was ultimately intubated 8/13 evening.  Patient was on a Heart Healthy/Carb Modified diet on 8/12 at 1025 until time of intubation yesterday evening (~1930). Flow sheet documentation indicates he ate 100% of lunch yesterday.   Patient remains intubated with OGT in place. The only documented imaging including tube is CXR on 8/13 which indicates side port at [facility] and recommendation for advancement of tube.   Patient discussed in rounds this AM and plan to hold on TF today and possibly start on 8/15. Will need to confirm placement of OGT prior to TF initiation.   OGT currently to LIS with scant amount of coffee ground output present at the time of RD visit this afternoon. No visitors present at the time of RD visit.   Weight today is 166 lb, weight yesterday was 167 lb,  and PTA the most recently documented weight was 183 lb on 5/1. This indicates 16 lb weight loss (8.7% body weight) in the past 3 months; significant for time frame. Monitor weight trends and diuretics ordered.    Patient is currently intubated on ventilator support MV: 10.9 L/min Temp (24hrs), Avg:99.5 F (37.5 C), Min:97.7 F (36.5 C), Max:100.4 F (38 C) Propofol: none BP: 107/86 and MAP: 91   Labs reviewed; no CBGs, Ca: 8.1 mg/dl, Mg: 1.4 mg/dl. K and Phos WDL.  Medications reviewed; 100 mg colace BID, 20 mg IV lasix x1 dose 8/13, 60 mg IV lasix BID started 8/13, 4 g IV Mg sulfate x1 run 8/14, 1 tablet multivitamin with minerals/day, 17 g miralax/day, 10 mEq IV KCl x4 runs 8/14, 100 mg IV thiamine/day started 8/14.  Drips; fentanyl @ 150 mcg/hr, versed @ 1.5 mg/hr.    NUTRITION - FOCUSED PHYSICAL EXAM:  Flowsheet Row Most Recent Value  Orbital Region Unable to assess  [ETT holder]  Upper Arm Region No depletion  Thoracic and Lumbar Region No depletion  Buccal Region Unable to assess  [ETT holder]  Temple Region No depletion  Clavicle Bone Region No depletion  Clavicle and Acromion Bone Region No depletion  Scapular Bone Region No depletion  Dorsal Hand No depletion  Patellar Region No depletion  Anterior Thigh Region No depletion  Posterior Calf Region No depletion  [LLE only d/t R AKA]  Edema (RD Assessment) Mild  [hands and LLE]  Hair Reviewed  Eyes Unable to assess  Mouth Unable to assess  Skin Reviewed  Nails Reviewed       Diet Order:   Diet Order             Diet NPO time specified  Diet effective now                   EDUCATION NEEDS:   Not appropriate for education at this time  Skin:  Skin Assessment: Skin Integrity Issues: Skin Integrity Issues:: Other (Comment) Other: L ankle venous stasis wound  Last BM:  8/13 (type 6 x2, one medium amount and one large amount; type 5 x1, smear)  Height:   Ht Readings from Last 1 Encounters:   07/13/22 5\' 9"  (1.753 m)    Weight:   Wt Readings from Last 1 Encounters:  07/14/22 75.6 kg     BMI:  Body mass index is 24.61 kg/m.  Estimated Nutritional Needs:  Kcal:  1998 kcal Protein:  105-120 grams Fluid:  >/= 1.5 L/day     07/16/22, MS, RD, LDN, CNSC Registered Dietitian II Inpatient Clinical Nutrition RD pager # and on-call/weekend pager # available in [facility]

## 2022-07-14 NOTE — Progress Notes (Signed)
[facility] ADULT ICU REPLACEMENT PROTOCOL   The patient does apply for the [facility] Adult ICU Electrolyte Replacment Protocol based on the criteria listed below:   1.Exclusion criteria: TCTS patients, ECMO patients, and Dialysis patients 2. Is GFR >/= 30 ml/min? Yes.    Patient's GFR today is >60 3. Is SCr </= 2? Yes.   Patient's SCr is 1.16 mg/dL 4. Did SCr increase >/= 0.5 in 24 hours? No. 5.Pt's weight >40kg  Yes.   6. Abnormal electrolyte(s): K, Mag  7. Electrolytes replaced per protocol 8.  Call MD STAT for K+ </= 2.5, Phos </= 1, or Mag </= 1 Physician:  [facility] 07/14/2022 5:17 AM

## 2022-07-14 NOTE — Consult Note (Addendum)
Cardiology Consultation:   Patient ID: [name] MRN: [id number]; DOB: 03-06-1966  Admit date: 07/12/2022 Date of Consult: 07/14/2022  PCP:  Marliss Coots, NP   [facility] Providers Cardiologist:  Minus Breeding, MD        Patient Profile:   [name] is a 56 y.o. 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 Ander Slade.  History of Present Illness:   [name]  history of noncompliance and polysubstance abuse with alcohol and cocaine.  We had seen him multiple times in the hospital but he never follows up in the office.  He has 100% no-show rate in our office.  He was initially diagnosed with systolic CHF in [id number] when he was found to have a LVEF of 35-40%.  However, repeat echo in 03/2019 showed significant drop in LVEF to 15%. Prior cardiac catheterization at [facility] in 10/2019 showed diffuse atherosclerosis in all coronary distributions with the worst lesion being 60-70% stenosis in the proximal circumflex.  Degree of cardiomyopathy was felt to be out of proportion to degree of CAD suggesting a nonischemic etiology.  He underwent coronary CTA in 03/2020 which showed moderate to severe CAD.  Functional assessment was recommended at that time; however, he left AMA prior to evaluation.  Nuclear stress test was ordered as an outpatient but was deferred in the setting of wheezing on exam.  He never followed up in clinic after this.  Overall, he has been felt to be a poor candidate for invasive ischemic evaluation due to noncompliance and cocaine abuse.   Hospitalized 01/2022 with slurred speech, HTN urgency, MRI brain neg for CVA, + CHF, COPD with exacerbation  EF <20%.  He left AMA.  Has had several ER visit for leg pain, SOB.  Hospitalized in 03/2022 with  acute on chronic combined CHF.   Pt was noted to be homeless and with cellulitis as well. +DVT in brachial vein at that time, placed on eliquis.  + depression and evaluated by psychiatry.    Further admits for leg pain, sepsis and leg wounds. Possible osteo, MRI second digit-ABIs normal on 3/23 ortho saw.  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 Marland Kitchen  Pt presented to ER on 8/12/23by EMS for SOB, had been noncompliant with lasix and home 02.  None in 2 weeks.  He also complained of Lt lower ext pain.   He had on CXR possible PNA, Rt midlung opacity, and lt foot and ankle xrays without osteomyelitis or fx.  Admitted with acute on chronic combined HF, BB held due to acute decompensation. COPD with acute exacerbation.  PNA placed on ceftriaxone and doxy.  + QT prolongation with hx of this.   Hs troponin 45 then 38 BNP 2089 Procalcitonin <0.10 WBC 5.1 Hgb 11.6 plts 238 Na 138, K+ 4.0, BUN 13, Cr 1.09  now k+ 3.4 and Mg+ 1.4 LFTS normal. Ammonia 22 --Co-ox 68.6  Admitted lasix 40 mg once then lasix 20 twice given  Eliquis continued   Despite diuresis pt with decreased sats and placed on BiPAP.  Frequently pt would not wear Bipap. Also treated for agitation.  With increased 02 requirement and refusing BiPAP CCM asked to consult.   Central line placed with lower CVP.   Intubated 1945.  NG placed with coffee ground secretions. Pantoprazole started.  Yesterday he was neg 2,619 with possible drop in wt from 83 Kg to 75.9 kg.   He became hypotensive Questionable CAP vs wedge shaped infarct from PE vs atelectasis.   EKG:  The EKG was personally reviewed and demonstrates:  SR with LVH, PVCs freq and QTc prolonged though poor EKG to measure. Follow up EKGs similar to ST.  Compared to old EKGs no acute changes. Telemetry:  Telemetry was personally reviewed and demonstrates:  SR to ST with PVCs  Echo pending--just done   BP 89/65 to 112/75 pk temp 100.2 R 20 on vent.  On Fentanyl drop and  midazolam,  Rec'd 1 L LR,  Has rec'd IV K+ and Mg+   Past Medical History:  Diagnosis Date   Asthma    Bipolar 1 disorder (HCC)    CHF (congestive heart failure) (HCC)    COPD (chronic obstructive pulmonary disease) (HCC)    Depression    Diabetes mellitus without complication (HCC)    GERD (gastroesophageal reflux disease)    Homelessness    Hypertension    QT prolongation    Schizoaffective disorder (HCC)     Past Surgical History:  Procedure Laterality Date   AMPUTATION TOE Bilateral 01/02/2021   Procedure: AMPUTATION TOE;  Surgeon: Park Liter, DPM;  Location: [facility] OR;  Service: Podiatry;  Laterality: Bilateral;  Ok to leave patient on hospital bed   IR ANGIO INTRA EXTRACRAN SEL COM CAROTID INNOMINATE BILAT MOD SED  12/17/2020   IR ANGIO VERTEBRAL SEL VERTEBRAL BILAT MOD SED  12/17/2020   RADIOLOGY WITH ANESTHESIA N/A 12/17/2020   Procedure: IR WITH ANESTHESIA;  Surgeon: Radiologist, Medication, MD;  Location: [facility] OR;  Service: Radiology;  Laterality: N/A;     Home Medications:  Prior to Admission medications   Medication Sig Start Date End Date Taking? Authorizing Provider  albuterol (VENTOLIN HFA) 108 (90 Base) MCG/ACT inhaler Inhale 2 puffs into the lungs every 6 (six) hours as needed for wheezing or shortness of breath. Patient not taking: Reported on 07/12/2022 03/07/22   Meredeth Ide, MD  apixaban (ELIQUIS) 5 MG TABS tablet Take 1 tablet (5 mg total) by mouth 2 (two) times daily. Patient not taking: Reported on 07/12/2022 05/20/22   Dione Booze, MD  ARIPiprazole (ABILIFY) 5 MG tablet Take 1 tablet (5 mg total) by mouth daily. Patient not taking: Reported on 07/12/2022 05/20/22   Dione Booze, MD  carvedilol (COREG) 3.125 MG tablet Take 1 tablet (3.125 mg total) by mouth 2 (two) times daily with a meal. Patient not taking: Reported on 07/12/2022 05/20/22   Dione Booze, MD  folic acid (FOLVITE) 1 MG tablet Take 1 tablet (1 mg total) by mouth daily. Patient not taking: Reported  on 07/12/2022 03/08/22   Meredeth Ide, MD  furosemide (LASIX) 40 MG tablet Take 1 tablet (40 mg total) by mouth daily. Patient not taking: Reported on 07/12/2022 05/20/22   Dione Booze, MD  gabapentin (NEURONTIN) 400 MG capsule Take 1 capsule (400 mg total) by mouth 3 (three) times daily. Patient not taking: Reported on 07/12/2022 05/20/22   Dione Booze, MD  levETIRAcetam (KEPPRA) 500 MG tablet Take 1 tablet (500 mg total) by mouth 2 (two) times daily. Patient not taking: Reported on 07/12/2022 05/20/22 07/19/22  Dione Booze, MD  oxyCODONE (OXY IR/ROXICODONE) 5 MG immediate release tablet Take 1 tablet (5 mg total) by mouth every 6 (six) hours as needed for severe pain or  breakthrough pain. Patient not taking: Reported on [id number]   Delora Fuel, MD  oxyCODONE (OXY IR/ROXICODONE) 5 MG immediate release tablet Take 1 tablet (5 mg total) by mouth every 6 (six) hours as needed for severe pain or breakthrough pain . Patient not taking: Reported on [id number]   Delora Fuel, MD  thiamine 100 MG tablet Take 1 tablet (100 mg total) by mouth daily. Patient not taking: Reported on [id number]   Delora Fuel, MD    Inpatient Medications: Scheduled Meds:  apixaban  5 mg Oral BID   ARIPiprazole  5 mg Oral Daily   budesonide (PULMICORT) nebulizer solution  0.5 mg Nebulization Q12H   Chlorhexidine Gluconate Cloth  6 each Topical Daily   docusate  100 mg Per Tube BID   folic acid  1 mg Intravenous Daily   furosemide  60 mg Intravenous BID   gabapentin  400 mg Oral TID   ipratropium-albuterol  3 mL Nebulization Q6H   multivitamin with minerals  1 tablet Oral Daily   mouth rinse  15 mL Mouth Rinse Q2H   pantoprazole (PROTONIX) IV  40 mg Intravenous Q12H   polyethylene glycol  17 g Per Tube Daily   potassium chloride  20 mEq Oral Daily   thiamine (VITAMIN B1) injection  100 mg Intravenous Daily   Continuous Infusions:  cefTRIAXone (ROCEPHIN)  IV Stopped (07/14/22 0413)   doxycycline  (VIBRAMYCIN) IV Stopped (07/14/22 [id number])   fentaNYL infusion INTRAVENOUS 150 mcg/hr (07/14/22 0700)   midazolam 1.5 mg/hr (07/14/22 0700)   potassium chloride 50 mL/hr at 07/14/22 0700   propofol (DIPRIVAN) infusion Stopped (07/13/22 2231)   PRN Meds: fentaNYL (SUBLIMAZE) injection, fentaNYL (SUBLIMAZE) injection, guaiFENesin, hydrALAZINE, ipratropium-albuterol, LORazepam **OR** LORazepam, metoprolol tartrate, mouth rinse, oxyCODONE, prochlorperazine, traZODone, white petrolatum  Allergies:    Allergies  Allergen Reactions   Motrin [Ibuprofen] Anaphylaxis   Tylenol [Acetaminophen] Hives, Nausea And Vomiting and Rash   Lyrica Cr [Pregabalin Er] Other (See Comments)    Dystonia Dysarthria    Peanut-Containing Drug Products Other (See Comments)    Pt denies allergy to peanut butter 03/02/2021    Social History:   Social History   Socioeconomic History   Marital status: Single    Spouse name: Not on file   Number of children: 4   Years of education: Not on file   Highest education level: Not on file  Occupational History   Occupation: N/A  Tobacco Use   Smoking status: Every Day    Years: 30.00    Types: Cigarettes   Smokeless tobacco: Never  Vaping Use   Vaping Use: Never used  Substance and Sexual Activity   Alcohol use: Yes   Drug use: Yes    Types: Cocaine   Sexual activity: Not Currently    Partners: Female    Birth control/protection: None  Other Topics Concern   Not on file  Social History Narrative   ** Merged History Encounter **       Social Determinants of Health   Financial Resource Strain: Not on file  Food Insecurity: Not on file  Transportation Needs: Not on file  Physical Activity: Not on file  Stress: Not on file  Social Connections: Not on file  Intimate Partner Violence: Not on file    Family History:    Family History  Problem Relation Age of Onset   Drug abuse Mother    Suicidality Mother      ROS: pt sedated on Vent information  found in HPI and H&P Please see the history of present illness.  General:no colds or fevers, no weight changes Skin:no rashes or ulcers HEENT:no blurred vision, no congestion CV:see HPI PUL:see HPI GI:no diarrhea constipation or melena, no indigestion GU:no hematuria, no dysuria MS:no joint pain, no claudication + AKA Rt Neuro:no syncope, no lightheadedness, hx jof seizure  Endo:+ diabetes, no thyroid disease  All other ROS reviewed and negative.     Physical Exam/Data:   Vitals:   07/14/22 0615 07/14/22 0630 07/14/22 0645 07/14/22 0700  BP: (!) 94/59 100/67 (!) 73/52 (!) 89/65  Pulse: 85 86 85 86  Resp: 19 20 20 20   Temp: 100.2 F (37.9 C) 100 F (37.8 C) 100 F (37.8 C) 100 F (37.8 C)  TempSrc:      SpO2: 91% 93% 90% 95%  Weight:      Height:        Intake/Output Summary (Last 24 hours) at 07/14/2022 0754 Last data filed at 07/14/2022 0700 Gross per 24 hour  Intake 1279.55 ml  Output 2260 ml  Net -980.45 ml      07/14/2022    5:00 AM 07/13/2022    8:00 PM 07/12/2022    7:33 AM  Last 3 Weights  Weight (lbs) 166 lb 10.7 oz 167 lb 5.3 oz 183 lb  Weight (kg) 75.6 kg 75.9 kg 83.008 kg     Body mass index is 24.61 kg/m.  General:  Well nourished, in no acute distress sedated on vent HEENT: normal Neck: mild JVD Mild HJR Vascular: No carotid bruits; Rt AKA and Left difficult to palpate Cardiac:  normal S1, S2; RRR; no murmur gallup or rub Lungs:  clear to auscultation bilaterally, no wheezing, rhonchi or rales on Vent Abd: soft, nontender, no hepatomegaly -OG in place Ext: no edema Musculoskeletal:  Rt AKA, Lt gt toe amputated, several dressings on Lt footl Skin: warm and dry  Neuro:  pt sedated, unable to access, Psych:  pt sedated  Relevant CV Studies: Echo pending this admit   Echo 01/30/22  IMPRESSIONS     1. LV apical false tendon (normal variant). Left ventricular ejection  fraction, by estimation, is <20%. The left ventricle has severely   decreased function. The left ventricle demonstrates global hypokinesis.  The left ventricular internal cavity size was   mildly dilated. There is mild left ventricular hypertrophy. Left  ventricular diastolic parameters are consistent with Grade III diastolic  dysfunction (restrictive). Elevated left ventricular end-diastolic  pressure. The E/e' is 32.   2. Right ventricular systolic function is mildly reduced. The right  ventricular size is mildly enlarged. There is moderately elevated  pulmonary artery systolic pressure. The estimated right ventricular  systolic pressure is [id number] mmHg.   3. Left atrial size was moderately dilated.   4. Right atrial size was moderately dilated.   5. The mitral valve is abnormal. Mild to moderate mitral valve  regurgitation.   6. The aortic valve is tricuspid. Aortic valve regurgitation is not  visualized. Aortic valve sclerosis is present, with no evidence of aortic  valve stenosis.   7. Mildly dilated pulmonary artery.   8. The inferior vena cava is normal in size with <50% respiratory  variability, suggesting right atrial pressure of 8 mmHg.   Comparison(s): Changes from prior study are noted. 12/18/2020: LVEF <20%.   FINDINGS   Left Ventricle: LV apical false tendon (normal variant). Left ventricular  ejection fraction, by estimation, is <20%. The left ventricle has severely  decreased  function. The left ventricle demonstrates global hypokinesis.  The left ventricular internal  cavity size was mildly dilated. There is mild left ventricular  hypertrophy. Left ventricular diastolic parameters are consistent with  Grade III diastolic dysfunction (restrictive). Elevated left ventricular  end-diastolic pressure. The E/e' is 22.   Right Ventricle: The right ventricular size is mildly enlarged. No  increase in right ventricular wall thickness. Right ventricular systolic  function is mildly reduced. There is moderately elevated pulmonary artery   systolic pressure. The tricuspid  regurgitant velocity is 3.15 m/s, and with an assumed right atrial  pressure of 8 mmHg, the estimated right ventricular systolic pressure is  47.7 mmHg.   Left Atrium: Left atrial size was moderately dilated.   Right Atrium: Right atrial size was moderately dilated.   Pericardium: There is no evidence of pericardial effusion.   Mitral Valve: The mitral valve is abnormal. There is mild thickening of  the anterior and posterior mitral valve leaflet(s). Mild to moderate  mitral valve regurgitation, with centrally-directed jet.   Tricuspid Valve: The tricuspid valve is grossly normal. Tricuspid valve  regurgitation is trivial.   Aortic Valve: The aortic valve is tricuspid. Aortic valve regurgitation is  not visualized. Aortic valve sclerosis is present, with no evidence of  aortic valve stenosis. Aortic valve mean gradient measures 2.0 mmHg.  Aortic valve peak gradient measures 3.7   mmHg. Aortic valve area, by VTI measures 2.17 cm.   Pulmonic Valve: The pulmonic valve was grossly normal. Pulmonic valve  regurgitation is trivial.   Aorta: The aortic root and ascending aorta are structurally normal, with  no evidence of dilitation.   Pulmonary Artery: The pulmonary artery is mildly dilated.   Venous: The inferior vena cava is normal in size with less than 50%  respiratory variability, suggesting right atrial pressure of 8 mmHg.   IAS/Shunts: No atrial level shunt detected by color flow Doppler.       Laboratory Data:  High Sensitivity Troponin:   Recent Labs  Lab 07/13/22 1850 07/13/22 2213  TROPONINIHS 45* 38*     Chemistry Recent Labs  Lab 07/12/22 0750 07/13/22 0516 07/14/22 0355  NA 138 141 141  K 4.0 4.2 3.5  CL 111 113* 106  CO2 19* 20* 27  GLUCOSE 92 110* 81  BUN 13 17 14   CREATININE 1.09 1.13 1.16  CALCIUM 8.7* 8.4* 8.1*  MG  --   --  1.4*  GFRNONAA >60 >60 >60  ANIONGAP 8 8 8     Recent Labs  Lab 07/12/22 0750   PROT 7.1  ALBUMIN 3.4*  AST 24  ALT 16  ALKPHOS 116  BILITOT 1.1   Lipids  Recent Labs  Lab 07/14/22 0355  TRIG 52    Hematology Recent Labs  Lab 07/12/22 0750 07/13/22 0516 07/14/22 0355  WBC 5.3 6.1 8.9  RBC 3.94* 3.66* 3.78*  HGB 11.6* 10.9* 11.2*  HCT 36.5* 34.4* 34.8*  MCV 92.6 94.0 92.1  MCH 29.4 29.8 29.6  MCHC 31.8 31.7 32.2  RDW 18.3* 18.2* 18.1*  PLT 238 239 240   Thyroid No results for input(s): "TSH", "FREET4" in the last 168 hours.  BNP Recent Labs  Lab 07/12/22 0812  BNP 2,089.0*    DDimer No results for input(s): "DDIMER" in the last 168 hours.   Radiology/Studies:  DG CHEST PORT 1 VIEW  Result Date: 07/13/2022 CLINICAL DATA:  Status post intubation EXAM: PORTABLE CHEST 1 VIEW COMPARISON:  07/12/2022 FINDINGS: Cardiac shadow is enlarged. Gastric catheter  extends into the stomach. Proximal side port lies at the gastroesophageal junction. This could be advanced deeper into the stomach. Endotracheal tube is seen with the catheter tip approximately 7 cm above the carina. Left subclavian central line is noted at the junction of the innominate veins. No pneumothorax is seen. Patchy airspace opacity is again noted in the right base laterally stable from the prior exam. Left lung remains clear. IMPRESSION: Tubes and lines as described. Persistent density in the lateral right lung base. Electronically Signed   By: Inez Catalina M.D.   On: 07/13/2022 21:15   DG Ankle Complete Left  Result Date: 07/12/2022 CLINICAL DATA:  Ankle wounds. EXAM: LEFT ANKLE COMPLETE - 3+ VIEW COMPARISON:  None Available. FINDINGS: Apparent skin defect identified laterally. Mild soft tissue swelling. No soft tissue gas identified. No bony erosion. No fractures. IMPRESSION: No evidence of osteomyelitis. No fracture. Apparent skin defect laterally. Electronically Signed   By: Dorise Bullion III M.D.   On: 07/12/2022 08:53   DG Foot Complete Left  Result Date: 07/12/2022 CLINICAL DATA:   Foot and ankle wounds. EXAM: LEFT FOOT - COMPLETE 3+ VIEW COMPARISON:  None Available. FINDINGS: The patient is status post amputation of his great toe. No fractures are identified. No bony erosion to suggest osteomyelitis. No soft tissue gas identified. A pes planus deformity is identified. No other abnormalities. IMPRESSION: No osteomyelitis.  No fractures. Electronically Signed   By: Dorise Bullion III M.D.   On: 07/12/2022 08:51   DG Chest 2 View  Result Date: 07/12/2022 CLINICAL DATA:  Shortness of breath. None complaints CHF. Ankle wounds. EXAM: CHEST - 2 VIEW COMPARISON:  None Available. FINDINGS: Focal opacity in the periphery of the right lung not identified on the March 23, 2022 chest x-ray. No pneumothorax. No overt edema. Stable cardiomegaly. The hila and mediastinum are normal. IMPRESSION: Focal opacity/infiltrate in the periphery of the right mid lung, not present on the March 23, 2022 study. Findings could represent pneumonia. If the patient has signs of infection, recommend treatment with short-term follow-up chest x-ray imaging to ensure resolution. If there are no signs of infection, a CT scan could better evaluate. Cardiomegaly.  No overt edema. Electronically Signed   By: Dorise Bullion III M.D.   On: 07/12/2022 08:48     Assessment and Plan:   Acute combined systolic and diastolic HF/NICM -  prior EF in March this year <20%, pt at times is non compliant and has been homeless.  He has never followed up in our office.  This combines to make treatment difficult.   Now after total of lasix 80 he is neg 1982, (with hypotension and low CVP reading 1 l LR given.) last echo with mod MR. (Initally dx of systolic CHF in [id number] with EF 35-40%, by 2020 EF to 15%.  And per cath at [facility] in 2020 cardiomyopathy out of proportion to degree of CAD suggestion NICM)  CO-OX 68 pt warm and dry.  On BB and imdur as outpt.  Spironolactone would be difficult to add as pt never follows up as outpt. Along with  other meds.  CAD with cardiac cath at [facility] with diffuse atherosclerosis in all coronary distrubution with worst lesion 60-70% stenosis in pLCX.  Coronary CTA in 2021 mod to severe CAD. Nuc study with significant motion and therefore non diagnostic. Troponin pk 45 from CHF. On prior admits average in the 14s.  Per Dr. Johnsie Cancel in 03/2022  "felt to be candidate for any  further ischemic evaluation due to ongoing medication noncompliance and cocaine use"  currently sedated on Vent would resume imdur and coreg when improved. Acute hypoxic resp failure and Rt mid lung opacity Intubated and Vent last evening. per CCM   Prolonged Qtc -monitor  Hx DVT (brachial vein 03/2022) on Eliquis but held currently due to coffee ground drainage from OT.  Will need to decide in next 24 hours to resume.  GI bleed and anemia of chronic disease see above  Hx of poly substance abuse and acute toxic and metabolic encephalopathy. Per IM and CCM Rt AKA in [facility] in May.  Hx of Lt great toe amputation several dressings on Lt foot for wounds  ABIs were normal in March. Hypokalemia and magnesium, being replaced.  Keep K+ 4.0 and Mg+ 2.0 Social issues unfortunately direct some of his care.  Hx of CVA/Seizure per IM   Schizoaffective disorder per IM   Risk Assessment/Risk Scores:        New York Heart Association (NYHA) Functional Class NYHA Class III        For questions or updates, please contact [facility] Please consult [email or web address] for contact info under    Signed, Cecilie Kicks, NP  07/14/2022 7:54 AM  Patient seen and examined.  Agree with above documentation.  [name] is a 56 year old male with a history of chronic combined heart failure (EF less than 20%), CAD, CVA, T2DM, COPD, polysubstance abuse, schizoaffective disorder we are consulted by Dr. Ander Slade for evaluation of heart failure.  He has a history of noncompliance, has been seen for hospital consult multiple times but has never  been seen in clinic.  Diagnosed with heart failure in 2018, EF 35 to 40% at that time.  Repeat echocardiogram 03/2019 showed EF down to 15%.  He had a cardiac catheterization at [facility] 10/2019 which showed diffuse atherosclerosis but no lesions requiring intervention.  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  He was hospitalized in March 2023 with hypertensive urgency and slurred speech.  He left AMA.  Hospitalized again in April 2023 with decompensated heart failure..  Underwent right AKA in May 2023 [facility].  He presented to ED 07/12/2022 with shortness of breath.  Reported had not taken Lasix in 2 weeks.  Also having pain in the left lower extremity.  Labs notable for procalcitonin less than 0.1, BNP 2089, troponin 45 > 38, WBC 5.1, hemoglobin 11.6, creatinine 1.09.  Lactate 0.9. Coo-ox 69%. He was admitted and started on IV Lasix.  Had worsening respiratory status, required BiPAP.  Central line was placed.  He required intubation.  Hypotensive overnight, given IV fluids. On exam, patient is intubated and sedated, regular rate and rhythm, no murmurs, lungs clear in anterior fields, status post right BKA, no left lower extremity edema.  Had hypotension overnight but resolved with IV fluids.  Currently normotensive.  Will check CVP to guide further diuresis.  Appears warm on exam.  Lactate normal.  Do not suspect cardiogenic shock.  Will follow-up echocardiogram.  Unfortunate situation, he is not a candidate for advanced therapies for his heart failure given his noncompliance and polysubstance abuse.  Unable to add GDMT at this time given hypotension overnight.  CRITICAL CARE TIME: I have spent a total of 35 minutes with patient reviewing hospital notes, telemetry, EKGs, labs and examining the patient as well as establishing an assessment and plan.  > 50% of time was spent in direct patient care. The patient is critically ill and requires high complexity decision making  for assessment and support, frequent  evaluation and titration of therapies, application of advanced monitoring technologies and extensive interpretation of multiple databases.  Donato Heinz, MD

## 2022-07-14 NOTE — Progress Notes (Signed)
PT Cancellation Note  Patient Details Name: [name] MRN: [id number] DOB: 1965-12-16   Cancelled Treatment:    Reason Eval/Treat Not Completed: Medical issues which prohibited therapy. Pt transferred to ICU and is now on vent. Will sign off at this time.    Faye Ramsay, PT Acute Rehabilitation  Office: [phone] Pager: [phone]

## 2022-07-14 NOTE — Progress Notes (Signed)
NAME:  [name], MRN:  [id number], DOB:  17-May-1966, LOS: 1 ADMISSION DATE:  07/12/2022, CONSULTATION DATE:  07/13/22 REFERRING MD:  Nelson Chimes, CHIEF COMPLAINT:  dyspnea   History of Present Illness:  56yM with history of asthma, bipolar 1 disorder, depression, schizoaffective disorder, chronic congestive heart failure, homelessness, GERD, hypertension, QT prolongation, type 2 diabetes, alcohol abuse, cocaine and cannabis abuse, tobacco use, chronic combined systolic and diastolic heart failure, right AKA who presented to ED with gradually worsening dyspnea, LE edema, chest pressure and productive cough.   He was admitted and started on ABX for possible CAP, diuresed for acute on chronic systolic heart failure. Oxygen requirement has risen today from 3L to 8L and he's refused BiPAP.  Agitation requiring a couple doses of IV ativan per CIWA scoring.  Pertinent  Medical History  Asthma BiPD/substance induced mood disturbance CHF GERD DM2 Polysubstance use/alcohol use Homelessness DVT  Significant Hospital Events: Including procedures, antibiotic start and stop dates in addition to other pertinent events   8/12 admitted started on ceftriaxone, doxy, diuresed 8/13 increasing O2 requirement to 8L, refusing bipap, agitation requiring a couple CIWA based doses of ativan 8/14 Some issues with agitation overnight, currently on Fentanyl and Versed drips  Interim History / Subjective:  Sedated on vent  Diuresed 2.2L in the last 24hrs   Objective   Blood pressure 94/66, pulse 89, temperature 100.2 F (37.9 C), resp. rate 20, height 5\' 9"  (1.753 m), weight 75.6 kg, SpO2 99 %.    Vent Mode: PRVC FiO2 (%):  [40 %-100 %] 40 % Set Rate:  [20 bmp] 20 bmp Vt Set:  [560 mL] 560 mL PEEP:  [5 cmH20] 5 cmH20 Plateau Pressure:  [16 cmH20-18 cmH20] 18 cmH20   Intake/Output Summary (Last 24 hours) at 07/14/2022 0706 Last data filed at 07/14/2022 0700 Gross per 24 hour  Intake 1279.55 ml  Output 2260  ml  Net -980.45 ml    Filed Weights   07/12/22 0733 07/13/22 2000 07/14/22 0500  Weight: 83 kg 75.9 kg 75.6 kg    Examination: General: Acute on chronically ill appearing middle aged male lying in bed  on mechanical ventilation, in NAD HEENT: ETT, MM pink/moist, PERRL,  Neuro: Sedated on vent  CV: s1s2 regular rate and rhythm, no murmur, rubs, or gallops,  PULM:  Slight rhonchi right greater than left, tolerating vent., no increased work of breathing  GI: soft, bowel sounds active in all 4 quadrants, non-tender, non-distended, slight coffee ground OG output  Extremities: warm/dry, no edema  Skin: no rashes or lesions  Resolved Hospital Problem list   N/a  Assessment & Plan:  Acute hypoxic respiratory failure Right mid-lung opacity -Doesn't necessarily seem to have a ton of pulmonary edema on CXR 8/12. Questionable CAP vs wedge shaped infarct from PE vs atelectasis. He appears surprisingly dry though during central line placement. 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 Head of bed elevated 30 degrees. Plateau pressures less than 30 cm H20.  Follow intermittent chest x-ray and ABG.   SAT/SBT as tolerated, mentation preclude extubation  Ensure adequate pulmonary hygiene  Follow cultures  VAP bundle in place  PAD protocol currently on Fentanyl and Versed, can try and swap Versed for Precedex once QTc improved  Scheduled BDs Continue empiric Ceftriaxone and Doxy   Acute on chronic systolic and diastolic heart failure CAD -COOX 8/13 with O2 sats 68.8 Prolonged QTc -  QTc 501 8/14  P: Continuous telemetry  Monitor BNP Strict intake and output  Daily weight to assess volume status Daily assessment for need to diurese Closely monitor renal function and electrolytes  Provide supplemtal oxygen as needed to maintain oxygen saturations above 90% Vent support as  above   Ensure hemodynamic control  Monitor for need to restart home beta blocker  Will need to hold Precedex today given prolonged QTc  Acute toxic and metabolic encephalopathy -Substance/etoh withdrawal, low output state possible. Ammonia and UA WNL History of polysubstance abuse  P: Maintain neuro protective measures; goal for eurothermia, euglycemia, eunatermia, normoxia, and PCO2 goal of 35-40 Nutrition and bowel regiment  Seizure precautions  Aspirations precautions  Supplement thiamin, folate, and MVI  History of DVT  -Anticoagulated at baseline with Eliquis  P: Hold Eliquis today 8/14 due tp coffee ground OG output  If unable to resume Eliquis within the next 24-48hrs may need Heparin drip   Coffee Ground OG output  P: Hold Eliquis as above  BID PPI  Hold oral meds   Anemia of chronic disease P: Trend CBC  Monitor for signs of bleeding  Hgb goal ? 7 Transfuse per protocol   Best Practice (right click and "Reselect all SmartList Selections" daily)  Per [facility]   Critical care time: 40 minutes  Sheridan Gettel D. Tiburcio Pea, NP-C [facility] Personal contact information can be found on Amion  07/14/2022, 7:14 AM

## 2022-07-14 NOTE — Progress Notes (Signed)
eLink Physician-Brief Progress Note Patient Name: [name] DOB: [birth year] MRN: [id number]   Date of Service  07/14/2022  HPI/Events of Note  Hypotension 73/52. Has had 2L urinary output. Bedside is asking for a bolus or start levo?   eICU Interventions  Ordered 1 liter LR bolus     Intervention Category Intermediate Interventions: Hypotension - evaluation and management  Darl Pikes 07/14/2022, 6:54 AM

## 2022-07-14 NOTE — Progress Notes (Signed)
Patient intubated yesterday evening now in ICU under PCCM care. [facility] will take over once patient stabilized. Discussed with Dr Wallene Dales MD

## 2022-07-14 NOTE — TOC Progression Note (Signed)
Transition of Care [facility]) - Progression Note    Patient Details  Name: [name] MRN: [id number] Date of Birth: [birth year]  Transition of Care [facility]) CM/SW Contact  Geni Bers, RN Phone Number: 07/14/2022, 9:38 AM  Clinical Narrative:    Pt is well known to [facility] team. Pt is homeless, has a history of substance abuse, noncompliant with taking his medications and noncompliant with PCP appointments for follow up. Pt has been worked up for SNF and faxed out to several SNF's in the past with everyone declining to take pt. Pt has been given information on substance abuse every time he was been admitted to the hospital, which he does not follow up on. Pt has a Wheelchair and a walker given to him. He is not eligible for any DME for the next 5 years.    Expected Discharge Plan: Homeless Shelter Barriers to Discharge: No Barriers Identified  Expected Discharge Plan and Services Expected Discharge Plan: Homeless Shelter       Living arrangements for the past 2 months: Hotel/Motel, Homeless Shelter                                       Social Determinants of Health (SDOH) Interventions    Readmission Risk Interventions    04/08/2022    1:43 PM 03/25/2022    5:39 PM 02/04/2022    4:41 PM  Readmission Risk Prevention Plan  Transportation Screening Complete Complete Complete  Medication Review [employer]) Complete Referral to Pharmacy Complete  PCP or Specialist appointment within 3-5 days of discharge Complete Complete Complete  HRI or Home Care Consult Not Complete Not Complete Complete  HRI or Home Care Consult Pt Refusal Comments Pt is homeless Patient is homeless   SW Recovery Care/Counseling Consult Complete Complete Complete  Palliative Care Screening Not Applicable Not Applicable Not Applicable  Skilled Nursing Facility Not Applicable Complete Complete

## 2022-07-14 NOTE — Progress Notes (Signed)
eLink Physician-Brief Progress Note Patient Name: [name] DOB: 02-18-1966 MRN: [id number]   Date of Service  07/14/2022  HPI/Events of Note  Patient with profound hypoglycemia.  eICU Interventions  D 5 % LR gtt ordered to follow D 50% glucose bolus Rx given to treat hypoglycemia.        Thomasene Lot [name] 07/14/2022, 8:19 PM

## 2022-07-15 ENCOUNTER — Inpatient Hospital Stay (HOSPITAL_COMMUNITY): Payer: Medicaid Other

## 2022-07-15 DIAGNOSIS — J9601 Acute respiratory failure with hypoxia: Secondary | ICD-10-CM | POA: Diagnosis not present

## 2022-07-15 DIAGNOSIS — F1910 Other psychoactive substance abuse, uncomplicated: Secondary | ICD-10-CM | POA: Diagnosis not present

## 2022-07-15 DIAGNOSIS — I5043 Acute on chronic combined systolic (congestive) and diastolic (congestive) heart failure: Secondary | ICD-10-CM | POA: Diagnosis not present

## 2022-07-15 DIAGNOSIS — I5023 Acute on chronic systolic (congestive) heart failure: Secondary | ICD-10-CM | POA: Diagnosis not present

## 2022-07-15 LAB — GLUCOSE, CAPILLARY
Glucose-Capillary: 126 mg/dL — ABNORMAL HIGH (ref 70–99)
Glucose-Capillary: 41 mg/dL — CL (ref 70–99)
Glucose-Capillary: 47 mg/dL — ABNORMAL LOW (ref 70–99)
Glucose-Capillary: 60 mg/dL — ABNORMAL LOW (ref 70–99)
Glucose-Capillary: 72 mg/dL (ref 70–99)
Glucose-Capillary: 78 mg/dL (ref 70–99)
Glucose-Capillary: 82 mg/dL (ref 70–99)
Glucose-Capillary: 83 mg/dL (ref 70–99)
Glucose-Capillary: 86 mg/dL (ref 70–99)
Glucose-Capillary: 86 mg/dL (ref 70–99)

## 2022-07-15 LAB — COOXEMETRY PANEL
Carboxyhemoglobin: 1.6 % — ABNORMAL HIGH (ref 0.5–1.5)
Methemoglobin: 1.3 % (ref 0.0–1.5)
O2 Saturation: 57.7 %
Total hemoglobin: 13.9 g/dL (ref 12.0–16.0)

## 2022-07-15 LAB — MAGNESIUM
Magnesium: 1.9 mg/dL (ref 1.7–2.4)
Magnesium: 2.2 mg/dL (ref 1.7–2.4)
Magnesium: 2 mg/dL (ref 1.7–2.4)

## 2022-07-15 LAB — PHOSPHORUS
Phosphorus: 3.3 mg/dL (ref 2.5–4.6)
Phosphorus: 3.3 mg/dL (ref 2.5–4.6)

## 2022-07-15 LAB — BASIC METABOLIC PANEL
Anion gap: 10 (ref 5–15)
BUN: 12 mg/dL (ref 6–20)
CO2: 29 mmol/L (ref 22–32)
Calcium: 8.2 mg/dL — ABNORMAL LOW (ref 8.9–10.3)
Chloride: 104 mmol/L (ref 98–111)
Creatinine, Ser: 1.01 mg/dL (ref 0.61–1.24)
GFR, Estimated: >60 mL/min (ref >60)
Glucose, Bld: 109 mg/dL — ABNORMAL HIGH (ref 70–99)
Potassium: 3.7 mmol/L (ref 3.5–5.1)
Sodium: 143 mmol/L (ref 135–145)

## 2022-07-15 MED ORDER — APIXABAN 5 MG PO TABS
5.0000 mg | ORAL_TABLET | Freq: Two times a day (BID) | ORAL | Status: DC
  Administered 2022-07-15 – 2022-07-17 (×3): 5 mg
  Filled 2022-07-15 (×4): qty 1

## 2022-07-15 MED ORDER — VITAL HIGH PROTEIN PO LIQD
1000.0000 mL | ORAL | Status: DC
  Administered 2022-07-15: 1000 mL

## 2022-07-15 MED ORDER — DEXTROSE 50 % IV SOLN: 25.0000 g | INTRAVENOUS | Status: AC

## 2022-07-15 MED ORDER — VITAL HIGH PROTEIN PO LIQD: 1000.0000 mL | ORAL | Status: DC

## 2022-07-15 MED ORDER — PROSOURCE TF20 ENFIT COMPATIBL EN LIQD
60.0000 mL | Freq: Two times a day (BID) | ENTERAL | Status: DC
  Administered 2022-07-15: 60 mL
  Filled 2022-07-15: qty 60

## 2022-07-15 MED ORDER — TRAZODONE HCL 50 MG PO TABS: 50.0000 mg | ORAL_TABLET | Freq: Every evening | ORAL | Status: DC | PRN

## 2022-07-15 MED ORDER — MAGNESIUM SULFATE 2 GM/50ML IV SOLN
2.0000 g | Freq: Once | INTRAVENOUS | Status: AC
Start: 2022-07-15 — End: 2022-07-15
  Administered 2022-07-15: 2 g via INTRAVENOUS
  Filled 2022-07-15: qty 50

## 2022-07-15 MED ORDER — DEXTROSE 50 % IV SOLN: 12.5000 g | INTRAVENOUS | Status: AC

## 2022-07-15 MED ORDER — DEXTROSE 50 % IV SOLN
INTRAVENOUS | Status: AC
  Administered 2022-07-15: 25 g via INTRAVENOUS
  Filled 2022-07-15: qty 50

## 2022-07-15 MED ORDER — VITAL HIGH PROTEIN PO LIQD: 1000.0000 mL | ORAL | Status: AC

## 2022-07-15 MED ORDER — FUROSEMIDE 10 MG/ML IJ SOLN: 40.0000 mg | Freq: Every day | INTRAMUSCULAR | Status: DC

## 2022-07-15 MED ORDER — DEXTROSE 50 % IV SOLN
INTRAVENOUS | Status: AC
  Administered 2022-07-15: 12.5 g via INTRAVENOUS
  Filled 2022-07-15: qty 50

## 2022-07-15 MED ORDER — PROSOURCE TF20 ENFIT COMPATIBL EN LIQD
60.0000 mL | Freq: Every day | ENTERAL | Status: DC
  Administered 2022-07-15: 60 mL
  Filled 2022-07-15: qty 60

## 2022-07-15 MED ORDER — OSMOLITE 1.5 CAL PO LIQD
1000.0000 mL | ORAL | Status: DC
  Administered 2022-07-15: 1000 mL
  Filled 2022-07-15: qty 1000

## 2022-07-15 MED ORDER — ARIPIPRAZOLE 5 MG PO TABS
5.0000 mg | ORAL_TABLET | Freq: Every day | ORAL | Status: DC
  Administered 2022-07-17: 5 mg
  Filled 2022-07-15: qty 1

## 2022-07-15 MED ORDER — DEXTROSE 10 % IV SOLN
INTRAVENOUS | Status: DC
Start: 2022-07-15 — End: 2022-07-25

## 2022-07-15 MED ORDER — POTASSIUM CHLORIDE 10 MEQ/50ML IV SOLN
10.0000 meq | INTRAVENOUS | Status: AC
  Administered 2022-07-15 (×4): 10 meq via INTRAVENOUS
  Filled 2022-07-15 (×4): qty 50

## 2022-07-15 MED ORDER — CLONAZEPAM 0.5 MG PO TBDP
0.5000 mg | ORAL_TABLET | Freq: Two times a day (BID) | ORAL | Status: DC
  Administered 2022-07-15 – 2022-07-17 (×4): 0.5 mg
  Filled 2022-07-15 (×4): qty 1

## 2022-07-15 MED ORDER — GABAPENTIN 400 MG PO CAPS
400.0000 mg | ORAL_CAPSULE | Freq: Three times a day (TID) | ORAL | Status: DC
Start: 2022-07-15 — End: 2022-07-17
  Administered 2022-07-15 – 2022-07-17 (×3): 400 mg
  Filled 2022-07-15 (×3): qty 1

## 2022-07-15 NOTE — Progress Notes (Signed)
NAME:  [name], MRN:  [id number], DOB:  Apr 03, 1966, LOS: 2 ADMISSION DATE:  07/12/2022, CONSULTATION DATE:  07/13/22 REFERRING MD:  Nelson Chimes, CHIEF COMPLAINT:  dyspnea   History of Present Illness:  56yM with history of asthma, bipolar 1 disorder, depression, schizoaffective disorder, chronic congestive heart failure, homelessness, GERD, hypertension, QT prolongation, type 2 diabetes, alcohol abuse, cocaine and cannabis abuse, tobacco use, chronic combined systolic and diastolic heart failure, right AKA who presented to ED with gradually worsening dyspnea, LE edema, chest pressure and productive cough.   He was admitted and started on ABX for possible CAP, diuresed for acute on chronic systolic heart failure. Oxygen requirement has risen today from 3L to 8L and he's refused BiPAP.  Agitation requiring a couple doses of IV ativan per CIWA scoring.  Pertinent  Medical History  Asthma BiPD/substance induced mood disturbance CHF GERD DM2 Polysubstance use/alcohol use Homelessness DVT  Significant Hospital Events: Including procedures, antibiotic start and stop dates in addition to other pertinent events   8/12 admitted started on ceftriaxone, doxy, diuresed 8/13 increasing O2 requirement to 8L, refusing bipap, agitation requiring a couple CIWA based doses of ativan 8/14 Some issues with agitation overnight, currently on Fentanyl and Versed drips 8/15 Issues with hypoglycemia and mild hypotension overnight, resolved with IV hydration  Interim History / Subjective:  Sedated on vent  T max 101.1 overnight  Diuresed ~6L in the last 24 with resultant hypotension, received IV fluids overnight   Objective   Blood pressure 124/82, pulse 92, temperature 98.8 F (37.1 C), resp. rate 20, height 5\' 9"  (1.753 m), weight 67.4 kg, SpO2 95 %. CVP:  [8 mmHg-12 mmHg] 8 mmHg  Vent Mode: PRVC FiO2 (%):  [40 %-41 %] 40 % Set Rate:  [20 bmp] 20 bmp Vt Set:  [560 mL] 560 mL PEEP:  [5 cmH20] 5  cmH20 Plateau Pressure:  [16 cmH20-19 cmH20] 19 cmH20   Intake/Output Summary (Last 24 hours) at 07/15/2022 0708 Last data filed at 07/15/2022 07/17/2022 Gross per 24 hour  Intake 2562.18 ml  Output 6125 ml  Net -3562.82 ml    Filed Weights   07/13/22 2000 07/14/22 0500 07/15/22 0400  Weight: 75.9 kg 75.6 kg 67.4 kg    Examination: General: Acute on chronically ill appearing deconditioned elderly male lying in bed on mechanical ventilation, in NAD HEENT: ETT, MM pink/moist, PERRL,  Neuro: Sedated on vent  CV: s1s2 regular rate and rhythm, no murmur, rubs, or gallops,  PULM:  Clear to ascultation, tolerating vent, no increased work of breathing, no added breath sounds  GI: soft, bowel sounds active in all 4 quadrants, non-tender, non-distended Extremities: warm/dry, no edema  Skin: no rashes or lesions  Resolved Hospital Problem list   Coffee Ground OG output   Assessment & Plan:  Acute hypoxic respiratory failure Right mid-lung opacity -Doesn't necessarily seem to have a ton of pulmonary edema on CXR 8/12. Questionable CAP vs wedge shaped infarct from PE vs atelectasis. He appears surprisingly dry though during central line placement. 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 Head of bed elevated 30 degrees. Plateau pressures less than 30 cm H20.  Follow intermittent chest x-ray and ABG.   Ensure adequate pulmonary hygiene  Follow cultures  VAP bundle in place  PAD protocol Continue BDs  Empiric  Ceftriaxone and Doxy   Acute on chronic systolic and diastolic heart failure CAD -COOX 8/13 with O2 sats 68.8 -Pre cardiology "Unfortunate situation, he is not a candidate for advanced therapies for his heart failure given his noncompliance and polysubstance  abuse." -Repeat ECHO 8/14 continues to display EH < 20 with severely decreased function with global hypokinesis and grade II diastolic dysfunction  Prolonged QTc -QTc 501 8/14  P: Cardiology following, appreciate assistance Continuous telemetry  Monitor BNP Strict intake and output  Daily weight to assess volume status Daily assessment for need to diurese  Closely monitor renal function and electrolytes  Vent support as above  Ensure hemodynamic control Obtain 12 lead to assess QTc   Acute toxic and metabolic encephalopathy -Substance/etoh withdrawal, low output state possible. Ammonia and UA WNL History of polysubstance abuse  P: Maintain neuro protective measures; goal for eurothermia, euglycemia, eunatermia, normoxia, and PCO2 goal of 35-40 Nutrition and bowel regiment  Seizure precautions  Aspirations precautions  Supplement Thiamine, Folate, and MVI Start low dose Klonopin to assist in Versed wean    History of DVT  -Anticoagulated at baseline with Eliquis  P: Resume home Eliquis  Monitor OG output   Anemia of chronic disease P: Trend CBC  Monitor for signs of bleeding  Hgb goal > 7 Transfuse per protocol   Right AKA P: Supportive care   Best Practice (right click and "Reselect all SmartList Selections" daily)   Diet/type: tubefeeds DVT prophylaxis: other - Eliquis  GI prophylaxis: PPI Lines: Central line Foley:  Yes, and it is still needed Code Status:  full code Last date of multidisciplinary goals of care discussion: Pending family discussion   Critical care time: 38 minutes  Jeremi Losito D. Tiburcio Pea, NP-C [facility] Personal contact information can be found on Amion  07/15/2022, 7:08 AM

## 2022-07-15 NOTE — Progress Notes (Signed)
[facility] ADULT ICU REPLACEMENT PROTOCOL   The patient does apply for the [facility] Adult ICU Electrolyte Replacment Protocol based on the criteria listed below:   1.Exclusion criteria: TCTS patients, ECMO patients, and Dialysis patients 2. Is GFR >/= 30 ml/min? Yes.    Patient's GFR today is >60 3. Is SCr </= 2? Yes.   Patient's SCr is 1.01 mg/dL 4. Did SCr increase >/= 0.5 in 24 hours? No. 5.Pt's weight >40kg  Yes.   6. Abnormal electrolyte(s): K+3.7, Mag 1.9  7. Electrolytes replaced per protocol 8.  Call MD STAT for K+ </= 2.5, Phos </= 1, or Mag </= 1 Physician:  Reyne Dumas 07/15/2022 4:44 AM

## 2022-07-15 NOTE — Progress Notes (Addendum)
Progress Note  Patient Name: [name] Date of Encounter: 07/15/2022  [facility] Cardiologist: Minus Breeding, MD   Subjective   Sedated on Vent  Inpatient Medications    Scheduled Meds:  apixaban  5 mg Per Tube BID   ARIPiprazole  5 mg Oral Daily   budesonide (PULMICORT) nebulizer solution  0.5 mg Nebulization Q12H   Chlorhexidine Gluconate Cloth  6 each Topical Daily   clonazePAM  0.5 mg Per Tube BID   docusate  100 mg Per Tube BID   feeding supplement (PROSource TF20)  60 mL Per Tube Daily   feeding supplement (VITAL HIGH PROTEIN)  1,000 mL Per Tube [id number]   folic acid  1 mg Intravenous Daily   gabapentin  400 mg Oral TID   ipratropium-albuterol  3 mL Nebulization Q6H   multivitamin with minerals  1 tablet Per Tube Daily   mouth rinse  15 mL Mouth Rinse Q2H   pantoprazole (PROTONIX) IV  40 mg Intravenous Q12H   polyethylene glycol  17 g Per Tube Daily   thiamine (VITAMIN B1) injection  100 mg Intravenous Daily   Continuous Infusions:  cefTRIAXone (ROCEPHIN)  IV Stopped (07/15/22 0408)   dexmedetomidine (PRECEDEX) IV infusion     dextrose 5% lactated ringers 75 mL/hr at 07/15/22 0655   doxycycline (VIBRAMYCIN) IV Stopped (07/15/22 [id number])   fentaNYL infusion INTRAVENOUS 150 mcg/hr (07/15/22 0907)   midazolam 1.5 mg/hr (07/15/22 0655)   potassium chloride 10 mEq (07/15/22 0831)   PRN Meds: fentaNYL (SUBLIMAZE) injection, fentaNYL (SUBLIMAZE) injection, guaiFENesin, hydrALAZINE, ipratropium-albuterol, metoprolol tartrate, mouth rinse, prochlorperazine, traZODone, white petrolatum   Vital Signs    Vitals:   07/15/22 0600 07/15/22 0732 07/15/22 0738 07/15/22 0740  BP: 124/82     Pulse: 92     Resp: 20     Temp: 98.8 F (37.1 C)     TempSrc:      SpO2: 95% 100% 100% 100%  Weight:      Height:        Intake/Output Summary (Last 24 hours) at 07/15/2022 0909 Last data filed at 07/15/2022 0655 Gross per 24 hour  Intake 1913.93 ml  Output 6125 ml  Net  -4211.07 ml      07/15/2022    4:00 AM 07/14/2022    5:00 AM 07/13/2022    8:00 PM  Last 3 Weights  Weight (lbs) 148 lb 9.4 oz 166 lb 10.7 oz 167 lb 5.3 oz  Weight (kg) 67.4 kg 75.6 kg 75.9 kg      Telemetry    SR with occ PVCs - Personally Reviewed  ECG    No new - Personally Reviewed  Physical Exam   GEN: No acute distress.  Intubated and sedated. Neck: No JVD Cardiac: RRR, no murmurs, rubs, or gallops.  Respiratory: Clear to auscultation bilaterally. Anteriorly  GI: Soft, nontender, non-distended  MS: No edema; No deformity. Neuro:  Nonfocal  Psych: Normal affect   Labs    High Sensitivity Troponin:   Recent Labs  Lab 07/13/22 1850 07/13/22 2213  TROPONINIHS 45* 38*     Chemistry Recent Labs  Lab 07/12/22 0750 07/13/22 0516 07/14/22 0355 07/15/22 0407  NA 138 141 141 143  K 4.0 4.2 3.5 3.7  CL 111 113* 106 104  CO2 19* 20* 27 29  GLUCOSE 92 110* 81 109*  BUN 13 17 14 12   CREATININE 1.09 1.13 1.16 1.01  CALCIUM 8.7* 8.4* 8.1* 8.2*  MG  --   --  1.4* 1.9  PROT 7.1  --   --   --   ALBUMIN 3.4*  --   --   --   AST 24  --   --   --   ALT 16  --   --   --   ALKPHOS 116  --   --   --   BILITOT 1.1  --   --   --   GFRNONAA >60 >60 >60 >60  ANIONGAP 8 8 8 10     Lipids  Recent Labs  Lab 07/14/22 0355  TRIG 52    Hematology Recent Labs  Lab 07/12/22 0750 07/13/22 0516 07/14/22 0355  WBC 5.3 6.1 8.9  RBC 3.94* 3.66* 3.78*  HGB 11.6* 10.9* 11.2*  HCT 36.5* 34.4* 34.8*  MCV 92.6 94.0 92.1  MCH 29.4 29.8 29.6  MCHC 31.8 31.7 32.2  RDW 18.3* 18.2* 18.1*  PLT 238 239 240   Thyroid No results for input(s): "TSH", "FREET4" in the last 168 hours.  BNP Recent Labs  Lab 07/12/22 0812  BNP 2,089.0*    DDimer No results for input(s): "DDIMER" in the last 168 hours.   Radiology    ECHOCARDIOGRAM COMPLETE  Result Date: 07/14/2022    ECHOCARDIOGRAM REPORT   Patient Name:   [name] Date of Exam: 07/14/2022 Medical Rec #:  [id number]       Height:       69.0 in Accession #:    [id number]     Weight:       166.7 lb Date of Birth:  July 23, 1966      BSA:          1.912 m Patient Age:    56 years       BP:           89/65 mmHg Patient Gender: M              HR:           90 bpm. Exam Location:  Inpatient Procedure: 2D Echo, 3D Echo, Cardiac Doppler and Color Doppler Indications:    Cardiomyopathy-Unspecified I42.9  History:        Patient has prior history of Echocardiogram examinations, most                 recent 01/30/2022. CHF, COPD; Risk Factors:Hypertension, GRED and                 Diabetes.  Sonographer:    Bernadene Person RDCS Referring Phys: [id number] ANKIT CHIRAG AMIN IMPRESSIONS  1. Left ventricular ejection fraction, by estimation, is <20%. Left ventricular ejection fraction by 3D volume is 20 %. The left ventricle has severely decreased function. The left ventricle demonstrates global hypokinesis. The left ventricular internal  cavity size was moderately dilated. There is mild left ventricular hypertrophy. Left ventricular diastolic parameters are consistent with Grade II diastolic dysfunction (pseudonormalization). Elevated left ventricular end-diastolic pressure. The E/e' is  24.  2. Right ventricular systolic function is moderately reduced. The right ventricular size is mildly enlarged. There is normal pulmonary artery systolic pressure. The estimated right ventricular systolic pressure is Q000111Q mmHg.  3. The mitral valve is abnormal. Trivial mitral valve regurgitation.  4. The aortic valve is tricuspid. Aortic valve regurgitation is not visualized.  5. The inferior vena cava is normal in size with <50% respiratory variability, suggesting right atrial pressure of 8 mmHg. Comparison(s): No significant change from prior study. 01/30/2022: LVEF <20%, grade 3 DD, elevated LV filling  pressure. FINDINGS  Left Ventricle: Left ventricular ejection fraction, by estimation, is <20%. Left ventricular ejection fraction by 3D volume is 20 %. The left ventricle  has severely decreased function. The left ventricle demonstrates global hypokinesis. The left ventricular internal cavity size was moderately dilated. There is mild left ventricular hypertrophy. Left ventricular diastolic parameters are consistent with Grade II diastolic dysfunction (pseudonormalization). Elevated left ventricular end-diastolic pressure. The E/e' is 24. Right Ventricle: The right ventricular size is mildly enlarged. No increase in right ventricular wall thickness. Right ventricular systolic function is moderately reduced. There is normal pulmonary artery systolic pressure. The tricuspid regurgitant velocity is 2.16 m/s, and with an assumed right atrial pressure of 8 mmHg, the estimated right ventricular systolic pressure is Q000111Q mmHg. Left Atrium: Left atrial size was normal in size. Right Atrium: Right atrial size was normal in size. Pericardium: There is no evidence of pericardial effusion. Mitral Valve: The mitral valve is abnormal. There is mild thickening of the anterior and posterior mitral valve leaflet(s). Trivial mitral valve regurgitation. Tricuspid Valve: The tricuspid valve is grossly normal. Tricuspid valve regurgitation is trivial. Aortic Valve: The aortic valve is tricuspid. Aortic valve regurgitation is not visualized. Pulmonic Valve: The pulmonic valve was normal in structure. Pulmonic valve regurgitation is not visualized. Aorta: The aortic root and ascending aorta are structurally normal, with no evidence of dilitation. Venous: The inferior vena cava is normal in size with less than 50% respiratory variability, suggesting right atrial pressure of 8 mmHg. IAS/Shunts: No atrial level shunt detected by color flow Doppler.  LEFT VENTRICLE PLAX 2D LVIDd:         6.10 cm         Diastology LVIDs:         5.60 cm         LV e' medial:    2.98 cm/s LV PW:         1.30 cm         LV E/e' medial:  29.3 LV IVS:        1.10 cm         LV e' lateral:   4.28 cm/s LVOT diam:     2.10 cm          LV E/e' lateral: 20.4 LV SV:         34 LV SV Index:   18 LVOT Area:     3.46 cm        3D Volume EF                                LV 3D EF:    Left                                             ventricul LV Volumes (MOD)                            ar LV vol d, MOD    199.0 ml                   ejection A2C:  fraction LV vol d, MOD    193.0 ml                   by 3D A4C:                                        volume is LV vol s, MOD    180.0 ml                   20 %. A2C: LV vol s, MOD    151.0 ml A4C:                           3D Volume EF: LV SV MOD A2C:   19.0 ml       3D EF:        20 % LV SV MOD A4C:   193.0 ml      LV EDV:       278 ml LV SV MOD BP:    30.6 ml       LV ESV:       223 ml                                LV SV:        55 ml RIGHT VENTRICLE RV S prime:     7.60 cm/s TAPSE (M-mode): 1.5 cm LEFT ATRIUM             Index        RIGHT ATRIUM           Index LA diam:        4.60 cm 2.41 cm/m   RA Area:     19.40 cm LA Vol (A2C):   66.8 ml 34.94 ml/m  RA Volume:   58.50 ml  30.60 ml/m LA Vol (A4C):   55.8 ml 29.18 ml/m LA Biplane Vol: 62.1 ml 32.48 ml/m  AORTIC VALVE LVOT Vmax:   67.90 cm/s LVOT Vmean:  40.700 cm/s LVOT VTI:    0.098 m  AORTA Ao Root diam: 3.20 cm Ao Asc diam:  3.50 cm MITRAL VALVE               TRICUSPID VALVE MV Area (PHT): 5.23 cm    TR Peak grad:   18.7 mmHg MV Decel Time: 145 msec    TR Vmax:        216.00 cm/s MV E velocity: 87.40 cm/s MV A velocity: 65.50 cm/s  SHUNTS MV E/A ratio:  1.33        Systemic VTI:  0.10 m                            Systemic Diam: 2.10 cm Lyman Bishop MD Electronically signed by Lyman Bishop MD Signature Date/Time: 07/14/2022/11:34:23 AM    Final    DG CHEST PORT 1 VIEW  Result Date: 07/13/2022 CLINICAL DATA:  Status post intubation EXAM: PORTABLE CHEST 1 VIEW COMPARISON:  07/12/2022 FINDINGS: Cardiac shadow is enlarged. Gastric catheter extends into the stomach. Proximal side port lies at the  gastroesophageal junction. This could be advanced deeper into the stomach. Endotracheal tube is seen with the catheter tip approximately 7 cm above the carina. Left subclavian central line is  noted at the junction of the innominate veins. No pneumothorax is seen. Patchy airspace opacity is again noted in the right base laterally stable from the prior exam. Left lung remains clear. IMPRESSION: Tubes and lines as described. Persistent density in the lateral right lung base. Electronically Signed   By: Inez Catalina M.D.   On: 07/13/2022 21:15    Cardiac Studies   Echo 07/14/22  IMPRESSIONS     1. Left ventricular ejection fraction, by estimation, is <20%. Left  ventricular ejection fraction by 3D volume is 20 %. The left ventricle has  severely decreased function. The left ventricle demonstrates global  hypokinesis. The left ventricular internal   cavity size was moderately dilated. There is mild left ventricular  hypertrophy. Left ventricular diastolic parameters are consistent with  Grade II diastolic dysfunction (pseudonormalization). Elevated left  ventricular end-diastolic pressure. The E/e' is   24.   2. Right ventricular systolic function is moderately reduced. The right  ventricular size is mildly enlarged. There is normal pulmonary artery  systolic pressure. The estimated right ventricular systolic pressure is  Q000111Q mmHg.   3. The mitral valve is abnormal. Trivial mitral valve regurgitation.   4. The aortic valve is tricuspid. Aortic valve regurgitation is not  visualized.   5. The inferior vena cava is normal in size with <50% respiratory  variability, suggesting right atrial pressure of 8 mmHg.   Comparison(s): No significant change from prior study. 01/30/2022: LVEF  <20%, grade 3 DD, elevated LV filling pressure.   FINDINGS   Left Ventricle: Left ventricular ejection fraction, by estimation, is  <20%. Left ventricular ejection fraction by 3D volume is 20 %. The left   ventricle has severely decreased function. The left ventricle demonstrates  global hypokinesis. The left  ventricular internal cavity size was moderately dilated. There is mild  left ventricular hypertrophy. Left ventricular diastolic parameters are  consistent with Grade II diastolic dysfunction (pseudonormalization).  Elevated left ventricular end-diastolic  pressure. The E/e' is 24.   Right Ventricle: The right ventricular size is mildly enlarged. No  increase in right ventricular wall thickness. Right ventricular systolic  function is moderately reduced. There is normal pulmonary artery systolic  pressure. The tricuspid regurgitant  velocity is 2.16 m/s, and with an assumed right atrial pressure of 8 mmHg,  the estimated right ventricular systolic pressure is Q000111Q mmHg.   Left Atrium: Left atrial size was normal in size.   Right Atrium: Right atrial size was normal in size.   Pericardium: There is no evidence of pericardial effusion.   Mitral Valve: The mitral valve is abnormal. There is mild thickening of  the anterior and posterior mitral valve leaflet(s). Trivial mitral valve  regurgitation.   Tricuspid Valve: The tricuspid valve is grossly normal. Tricuspid valve  regurgitation is trivial.   Aortic Valve: The aortic valve is tricuspid. Aortic valve regurgitation is  not visualized.   Pulmonic Valve: The pulmonic valve was normal in structure. Pulmonic valve  regurgitation is not visualized.   Aorta: The aortic root and ascending aorta are structurally normal, with  no evidence of dilitation.   Venous: The inferior vena cava is normal in size with less than 50%  respiratory variability, suggesting right atrial pressure of 8 mmHg.   IAS/Shunts: No atrial level shunt detected by color flow Doppler.   Echo 01/30/22  IMPRESSIONS     1. LV apical false tendon (normal variant). Left ventricular ejection  fraction, by estimation, is <20%. The left ventricle has  severely  decreased function. The left ventricle demonstrates global hypokinesis.  The left ventricular internal cavity size was   mildly dilated. There is mild left ventricular hypertrophy. Left  ventricular diastolic parameters are consistent with Grade III diastolic  dysfunction (restrictive). Elevated left ventricular end-diastolic  pressure. The E/e' is 22.   2. Right ventricular systolic function is mildly reduced. The right  ventricular size is mildly enlarged. There is moderately elevated  pulmonary artery systolic pressure. The estimated right ventricular  systolic pressure is 47.7 mmHg.   3. Left atrial size was moderately dilated.   4. Right atrial size was moderately dilated.   5. The mitral valve is abnormal. Mild to moderate mitral valve  regurgitation.   6. The aortic valve is tricuspid. Aortic valve regurgitation is not  visualized. Aortic valve sclerosis is present, with no evidence of aortic  valve stenosis.   7. Mildly dilated pulmonary artery.   8. The inferior vena cava is normal in size with <50% respiratory  variability, suggesting right atrial pressure of 8 mmHg.   Comparison(s): Changes from prior study are noted. 12/18/2020: LVEF <20%.   FINDINGS   Left Ventricle: LV apical false tendon (normal variant). Left ventricular  ejection fraction, by estimation, is <20%. The left ventricle has severely  decreased function. The left ventricle demonstrates global hypokinesis.  The left ventricular internal  cavity size was mildly dilated. There is mild left ventricular  hypertrophy. Left ventricular diastolic parameters are consistent with  Grade III diastolic dysfunction (restrictive). Elevated left ventricular  end-diastolic pressure. The E/e' is 22.   Right Ventricle: The right ventricular size is mildly enlarged. No  increase in right ventricular wall thickness. Right ventricular systolic  function is mildly reduced. There is moderately elevated pulmonary  artery  systolic pressure. The tricuspid  regurgitant velocity is 3.15 m/s, and with an assumed right atrial  pressure of 8 mmHg, the estimated right ventricular systolic pressure is  47.7 mmHg.   Left Atrium: Left atrial size was moderately dilated.   Right Atrium: Right atrial size was moderately dilated.   Pericardium: There is no evidence of pericardial effusion.   Mitral Valve: The mitral valve is abnormal. There is mild thickening of  the anterior and posterior mitral valve leaflet(s). Mild to moderate  mitral valve regurgitation, with centrally-directed jet.   Tricuspid Valve: The tricuspid valve is grossly normal. Tricuspid valve  regurgitation is trivial.   Aortic Valve: The aortic valve is tricuspid. Aortic valve regurgitation is  not visualized. Aortic valve sclerosis is present, with no evidence of  aortic valve stenosis. Aortic valve mean gradient measures 2.0 mmHg.  Aortic valve peak gradient measures 3.7   mmHg. Aortic valve area, by VTI measures 2.17 cm.   Pulmonic Valve: The pulmonic valve was grossly normal. Pulmonic valve  regurgitation is trivial.   Aorta: The aortic root and ascending aorta are structurally normal, with  no evidence of dilitation.   Pulmonary Artery: The pulmonary artery is mildly dilated.   Venous: The inferior vena cava is normal in size with less than 50%  respiratory variability, suggesting right atrial pressure of 8 mmHg.   IAS/Shunts: No atrial level shunt detected by color flow Doppler.    Patient Profile     56 y.o. 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  Assessment &  Plan      Acute combined systolic and diastolic HF/NICM -  prior EF in March this year <20%, and same now. pt at times is non compliant and has been  homeless.  He has never followed up in our office.  This combines to make treatment difficult.  Not a candidate for advanced therapies.  He has been diuresing well, became hypotensive overnight and was given fluid back.  CVP 9 today. Monitor CVP to guide diuresis.  No GDMT at this time given hypotension overnight, which was likely secondary to overdiuresis CAD with cardiac cath at [facility] with diffuse atherosclerosis in all coronary distrubution with worst lesion 60-70% stenosis in pLCX.  Coronary CTA in 2021 mod to severe CAD. Nuc study with significant motion and therefore non diagnostic. Troponin pk 45 from CHF. On prior admits average in the 40s.  Per Dr. Eden Emms in 03/2022  "not felt to be candidate for any further ischemic evaluation due to ongoing medication noncompliance and cocaine use"  currently sedated on Vent would resume imdur and coreg when improved. 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 Eliquis has been resumed GI bleed and anemia of chronic disease stable Hgb see above  Hx of poly substance abuse and acute toxic and metabolic encephalopathy. 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  Hx of Lt great toe amputation several dressings on Lt foot for wounds  ABIs were normal in March. Hypokalemia and magnesium, being replaced.   Keep K+ 4.0 and Mg+ 2.0 Social issues unfortunately direct some of his care.  Hx of CVA/Seizure per IM   Schizoaffective disorder per IM     For questions or updates, please contact [facility] Please consult [email or web address] for contact info under        Signed, Nada Boozer, NP  07/15/2022, 9:09 AM    Patient seen and examined.  Agree with above documentation.  On exam, patient is intubated and sedated, regular rate and rhythm, no murmurs, lungs CTAB, no lower extremity edema.  Had  significant diuresis yesterday, became hypotensive overnight and received IV fluids.  Lasix held. BP currently stable.  Telemetry shows sinus rhythm with rate 80s to 90s.  Fever to 101 yesterday.  CVP 9.  Diuretics were held, will monitor CVP to guide diuresis.  On FiO2 40%, PEEP5, holding off on extubation until through withdrawal period.  CRITICAL CARE TIME: I have spent a total of 33 minutes with patient reviewing hospital notes, telemetry, EKGs, labs and examining the patient as well as establishing an assessment and plan.  > 50% of time was spent in direct patient care. The patient is critically ill and requires high complexity decision making for assessment and support, frequent evaluation and titration of therapies, application of advanced monitoring technologies and extensive interpretation of multiple databases.   Little Ishikawa, MD

## 2022-07-15 NOTE — Progress Notes (Signed)
Nutrition Follow-up  DOCUMENTATION CODES:   Not applicable  INTERVENTION:  - ordered abdominal x-ray to confirm OGT placement (cleared by CCM MD)  - continue Vital High Protein @ 20 ml/hr until today at 2100 then transition to Osmolite 1.5 @ 30 ml/hr and advance by 10 ml every 8 hours to reach goal rate of Osmolite 1.5 @ 50 ml/hr with 60 ml Prosource TF20 BID.  - at goal rate, this regimen will provide 1960 kcal, 115 grams protein, and 914 ml free water.  - free water flush, if desired, to be per CCM.   NUTRITION DIAGNOSIS:   Inadequate oral intake related to inability to eat as evidenced by NPO status. -ongoing  GOAL:   Patient will meet greater than or equal to 90% of their needs -unable to meet at this time  MONITOR:   Vent status, Labs, Weight trends, I & O's  REASON FOR ASSESSMENT:   Consult Enteral/tube feeding initiation and management  ASSESSMENT:   56 year-old male with medical history of asthma, bipolar 1 disorder, depression, schizoaffective disorder, CHF, homelessness, GERD, HTN, QT prolongation, type 2 DM, alcohol abuse, cocaine and cannabis abuse, tobacco use, and R AKA. He presented to the ED due to gradually worsening dyspnea, LE edema, chest pressure, and productive cough. He was admitted due to need for abx for CAP and diuresis d/t acute on chronic CHF. He initially required 3L O2 and O2 requirement increased to 8L; patient refused BiPAP. He was ultimately intubated 8/13 evening.  Patient remains intubated with OGT in place. The only imaging that mentions OGT was CXR on 8/13 and report from image recommends advancement of tube.   TF protocol of Vital High Protein @ 40 ml/hr with 60 ml Prosource TF20 once/day started today at ~0900. Patient discussed in rounds and one-on-one with RN. Will decrease to Vital High Protein @ 20 ml/hr d/t tube placement not yet confirmed and coffee-ground output yesterday.   No visitors present at the time of RD visit today.    Weight -19 lb compared to yesterday following aggressive diuresis.    Patient is currently intubated on ventilator support MV: 11 L/min Temp (24hrs), Avg:100.2 F (37.9 C), Min:98 F (36.7 C), Max:101.1 F (38.4 C) Propofol: none BP: 101/72 and MAP: 79  Labs reviewed; CBGs: 72, 47, 41, 126, 78 mg/dl, Ca: 8.2 mg/dl.  Medications reviewed; 100 mg colace BID, 1 mg IV folic acid/day, 2 g IV Mg sulfate x1 run 8/15, 1 tablet multivitamin with minerals/day per tube, 40 mg IV protonix BID, 17 g miralax/day, 10 mEq IV KCl x4 runs 8/15, 100 mg IV thiamine started 8/14.  IVF; D5-LR @ 75 ml/hr (306 kcal/24 hrs).   Drip; fentanyl @ 200 mcg/hr.   Diet Order:   Diet Order             Diet NPO time specified  Diet effective now                   EDUCATION NEEDS:   Not appropriate for education at this time  Skin:  Skin Assessment: Skin Integrity Issues: Skin Integrity Issues:: Other (Comment) Other: L ankle venous stasis wound  Last BM:  8/13 (type 6 x2, one medium amount and one large amount; type 5 x1, smear)  Height:   Ht Readings from Last 1 Encounters:  07/13/22 5\' 9"  (1.753 m)    Weight:   Wt Readings from Last 1 Encounters:  07/15/22 67.4 kg     BMI:  Body  mass index is 21.94 kg/m.  Estimated Nutritional Needs:  Kcal:  1990 kcal Protein:  105-120 grams Fluid:  >/= 1.5 L/day     Trenton Gammon, MS, RD, LDN, CNSC Registered Dietitian II Inpatient Clinical Nutrition RD pager # and on-call/weekend pager # available in [facility]

## 2022-07-15 NOTE — Progress Notes (Signed)
Notified RN of BG of 47

## 2022-07-16 DIAGNOSIS — J9601 Acute respiratory failure with hypoxia: Secondary | ICD-10-CM | POA: Diagnosis not present

## 2022-07-16 DIAGNOSIS — I5023 Acute on chronic systolic (congestive) heart failure: Secondary | ICD-10-CM | POA: Diagnosis not present

## 2022-07-16 DIAGNOSIS — F1910 Other psychoactive substance abuse, uncomplicated: Secondary | ICD-10-CM | POA: Diagnosis not present

## 2022-07-16 DIAGNOSIS — R9431 Abnormal electrocardiogram [ECG] [EKG]: Secondary | ICD-10-CM | POA: Diagnosis not present

## 2022-07-16 LAB — GLUCOSE, CAPILLARY
Glucose-Capillary: 82 mg/dL (ref 70–99)
Glucose-Capillary: 100 mg/dL — ABNORMAL HIGH (ref 70–99)
Glucose-Capillary: 93 mg/dL (ref 70–99)
Glucose-Capillary: 111 mg/dL — ABNORMAL HIGH (ref 70–99)
Glucose-Capillary: 107 mg/dL — ABNORMAL HIGH (ref 70–99)
Glucose-Capillary: 114 mg/dL — ABNORMAL HIGH (ref 70–99)

## 2022-07-16 LAB — BASIC METABOLIC PANEL
Anion gap: 7 (ref 5–15)
BUN: 14 mg/dL (ref 6–20)
CO2: 27 mmol/L (ref 22–32)
Calcium: 7.9 mg/dL — ABNORMAL LOW (ref 8.9–10.3)
Chloride: 104 mmol/L (ref 98–111)
Creatinine, Ser: 0.88 mg/dL (ref 0.61–1.24)
GFR, Estimated: >60 mL/min (ref >60)
Glucose, Bld: 151 mg/dL — ABNORMAL HIGH (ref 70–99)
Potassium: 3.9 mmol/L (ref 3.5–5.1)
Sodium: 138 mmol/L (ref 135–145)

## 2022-07-16 LAB — MAGNESIUM
Magnesium: 1.7 mg/dL (ref 1.7–2.4)
Magnesium: 1.8 mg/dL (ref 1.7–2.4)

## 2022-07-16 LAB — CBC
HCT: 36.5 % — ABNORMAL LOW (ref 39.0–52.0)
Hemoglobin: 11.5 g/dL — ABNORMAL LOW (ref 13.0–17.0)
MCH: 29.6 pg (ref 26.0–34.0)
MCHC: 31.5 g/dL (ref 30.0–36.0)
MCV: 93.8 fL (ref 80.0–100.0)
Platelets: 267 10*3/uL (ref 150–400)
RBC: 3.89 MIL/uL — ABNORMAL LOW (ref 4.22–5.81)
RDW: 17.7 % — ABNORMAL HIGH (ref 11.5–15.5)
WBC: 10 10*3/uL (ref 4.0–10.5)
nRBC: 0 % (ref 0.0–0.2)

## 2022-07-16 LAB — CULTURE, RESPIRATORY W GRAM STAIN
Culture: NORMAL
Gram Stain: NONE SEEN

## 2022-07-16 LAB — COOXEMETRY PANEL
Carboxyhemoglobin: 1.9 % — ABNORMAL HIGH (ref 0.5–1.5)
Methemoglobin: 0.7 % (ref 0.0–1.5)
O2 Saturation: 77 %
Total hemoglobin: 12.3 g/dL (ref 12.0–16.0)

## 2022-07-16 LAB — PHOSPHORUS
Phosphorus: 3.1 mg/dL (ref 2.5–4.6)
Phosphorus: 4.6 mg/dL (ref 2.5–4.6)

## 2022-07-16 MED ORDER — MIDAZOLAM HCL 2 MG/2ML IJ SOLN
INTRAMUSCULAR | Status: AC
  Filled 2022-07-16: qty 2

## 2022-07-16 MED ORDER — MIDAZOLAM HCL 2 MG/2ML IJ SOLN
2.0000 mg | INTRAMUSCULAR | Status: DC | PRN
Start: 2022-07-16 — End: 2022-07-17
  Administered 2022-07-16 – 2022-07-17 (×4): 2 mg via INTRAVENOUS
  Filled 2022-07-16 (×4): qty 2

## 2022-07-16 MED ORDER — MIDAZOLAM HCL 2 MG/2ML IJ SOLN
2.0000 mg | Freq: Once | INTRAMUSCULAR | Status: AC
  Administered 2022-07-16: 2 mg via INTRAVENOUS

## 2022-07-16 MED ORDER — FUROSEMIDE 10 MG/ML IJ SOLN
40.0000 mg | Freq: Once | INTRAMUSCULAR | Status: AC
  Administered 2022-07-16: 40 mg via INTRAVENOUS
  Filled 2022-07-16: qty 4

## 2022-07-16 MED ORDER — MAGNESIUM SULFATE 2 GM/50ML IV SOLN
2.0000 g | Freq: Once | INTRAVENOUS | Status: AC
  Administered 2022-07-16: 2 g via INTRAVENOUS
  Filled 2022-07-16: qty 50

## 2022-07-16 NOTE — Progress Notes (Signed)
NAME:  [name], MRN:  [id number], DOB:  [birth year], LOS: 3 ADMISSION DATE:  07/12/2022, CONSULTATION DATE:  07/13/22 REFERRING MD:  Nelson Chimes, CHIEF COMPLAINT:  dyspnea   History of Present Illness:  56yM with history of asthma, bipolar 1 disorder, depression, schizoaffective disorder, chronic congestive heart failure, homelessness, GERD, hypertension, QT prolongation, type 2 diabetes, alcohol abuse, cocaine and cannabis abuse, tobacco use, chronic combined systolic and diastolic heart failure, right AKA who presented to ED with gradually worsening dyspnea, LE edema, chest pressure and productive cough.   He was admitted and started on ABX for possible CAP, diuresed for acute on chronic systolic heart failure. Oxygen requirement has risen today from 3L to 8L and he's refused BiPAP.  Agitation requiring a couple doses of IV ativan per CIWA scoring.  Pertinent  Medical History  Asthma BiPD/substance induced mood disturbance CHF GERD DM2 Polysubstance use/alcohol use Homelessness DVT  Significant Hospital Events: Including procedures, antibiotic start and stop dates in addition to other pertinent events   8/12 admitted started on ceftriaxone, doxy, diuresed 8/13 increasing O2 requirement to 8L, refusing bipap, agitation requiring a couple CIWA based doses of ativan 8/14 Some issues with agitation overnight, currently on Fentanyl and Versed drips 8/15 Issues with hypoglycemia and mild hypotension overnight, resolved with IV hydration 8/16 No issues overnight, now off versed drip  Interim History / Subjective:  More alert on vent, will open eyes and follow basic commands this am T-max 100.2 overnight  Remains -3L  On D10 drip for hypoglycemia   Objective   Blood pressure 99/62, pulse 83, temperature 99.3 F (37.4 C), temperature source Axillary, resp. rate 20, height 5\' 9"  (1.753 m), weight 75.1 kg, SpO2 96 %. CVP:  [9 mmHg] 9 mmHg  Vent Mode: PRVC FiO2 (%):  [40 %] 40 % Set Rate:   [20 bmp] 20 bmp Vt Set:  [560 mL] 560 mL PEEP:  [5 cmH20] 5 cmH20 Plateau Pressure:  [16 cmH20-18 cmH20] 16 cmH20   Intake/Output Summary (Last 24 hours) at 07/16/2022 0711 Last data filed at 07/16/2022 07/18/2022 Gross per 24 hour  Intake 3684.07 ml  Output 1057 ml  Net 2627.07 ml    Filed Weights   07/14/22 0500 07/15/22 0400 07/16/22 0402  Weight: 75.6 kg 67.4 kg 75.1 kg    Examination: General: Acute on chronically ill appearing middle aged male lying on mechanical ventilation, in NAD HEENT: ETT, MM pink/moist, PERRL,  Neuro: Opens eyes to verbal stimuli, follows some simple commands  CV: s1s2 regular rate and rhythm, no murmur, rubs, or gallops,  PULM:  Clear to ascultation, no increased work of breathing, tolerating vent  GI: soft, bowel sounds active in all 4 quadrants, non-tender, non-distended, tolerating TF Extremities: warm/dry, no edema  Skin: no rashes or lesions  Resolved Hospital Problem list   [employer] OG output   Assessment & Plan:  Acute hypoxic respiratory failure Right mid-lung opacity -Doesn't necessarily seem to have a ton of pulmonary edema on CXR 8/12. Questionable CAP vs wedge shaped infarct from PE vs atelectasis. He appears surprisingly dry though during central line placement. 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 Head of bed elevated 30 degrees. Plateau pressures less than 30 cm H20.  Follow intermittent chest x-ray and ABG.   Ensure adequate pulmonary hygiene  Follow cultures  VAP bundle in  place  PAD protocol Continue BDs Continue Ceftriaxone and Doxy   Acute on chronic systolic and diastolic heart failure CAD -COOX 8/13 with O2 sats 68.8 -Pre cardiology "Unfortunate situation, he is not a candidate for advanced therapies for his heart failure  given his noncompliance and polysubstance abuse." -Repeat ECHO 8/14 continues to display EH < 20 with severely decreased function with global hypokinesis and grade II diastolic dysfunction  Prolonged QTc -QTc 501 8/14  P: Cardiology following, appreciate assistance  Continuous telemetry  Strict intake and output  Daily weight to assess volume status Daily assessment for need to diurese  Closely monitor renal function and electrolytes  Vent support as above  Trend QTc daily   Acute toxic and metabolic encephalopathy -Substance/etoh withdrawal, low output state possible. Ammonia and UA WNL History of polysubstance abuse  P: Maintain neuro protective measures Nutrition and bowel regiment  Seizure precautions  Aspirations precautions  Supplement thiamine, folate, and MVI Continue low dose Klonopin   History of DVT  -Anticoagulated at baseline with Eliquis  P: Continue home Eliquis   Anemia of chronic disease P: Trend CBC  Monitor for signs of bleeding  Hgb goal > 7 Transfuse per protocol   Right AKA P: Supportive care   Best Practice (right click and "Reselect all SmartList Selections" daily)   Diet/type: tubefeeds DVT prophylaxis: other - Eliquis  GI prophylaxis: PPI Lines: Central line Foley:  Yes, and it is still needed Code Status:  full code Last date of multidisciplinary goals of care discussion: Pending family discussion. Attempted to call contact listed in chart with no response 8/15. Will try again daily   Critical care time: 38 minutes  Suly Vukelich D. Tiburcio Pea, NP-C [facility] Personal contact information can be found on Amion  07/16/2022, 7:11 AM

## 2022-07-16 NOTE — Progress Notes (Signed)
Pt has had three baths during shift. After extubation patient has been non-complaint with external catheter, once placed on patient he removes and urinates on floor and all over room. Pt was given a diet during shift, but should be monitor during eating. Pt is being followed by cardiology and CCM as a result the CVP has been continuous and D10 stopped. Patient received 40mg  of lasix once during shift. No orders at this time. Will continue to monitor.

## 2022-07-16 NOTE — Progress Notes (Signed)
Left foot wound reviewed  Granulation tissue No fluctuancy Does not appear to be tender on palpation  Posterior tibial pulses appreciated  We will go ahead with dressings Recent foot x-ray with no suggestion of osteomyelitis  We will continue to monitor

## 2022-07-16 NOTE — Progress Notes (Signed)
Pt recently extubated. Nurse will speak with team with about morning meds. Will continue to monitor.

## 2022-07-16 NOTE — TOC Progression Note (Signed)
Transition of Care [facility]) - Progression Note    Patient Details  Name: [name] MRN: [id number] Date of Birth: 04/26/1966  [facility] CM/SW Contact  Golda Acre, RN Phone Number: 07/16/2022, 7:37 AM  Clinical Narrative:    [phone] chart reviewed.  Following for toc needs.  Plan is to return home with self-care currently.   Expected Discharge Plan: Homeless Shelter Barriers to Discharge: No Barriers Identified  Expected Discharge Plan and Services Expected Discharge Plan: Homeless Shelter       Living arrangements for the past 2 months: Hotel/Motel, Homeless Shelter                                       Social Determinants of Health (SDOH) Interventions    Readmission Risk Interventions    04/08/2022    1:43 PM 03/25/2022    5:39 PM 02/04/2022    4:41 PM  Readmission Risk Prevention Plan  Transportation Screening Complete Complete Complete  Medication Review [employer]) Complete Referral to Pharmacy Complete  PCP or Specialist appointment within 3-5 days of discharge Complete Complete Complete  HRI or Home Care Consult Not Complete Not Complete Complete  HRI or Home Care Consult Pt Refusal Comments Pt is homeless Patient is homeless   SW Recovery Care/Counseling Consult Complete Complete Complete  Palliative Care Screening Not Applicable Not Applicable Not Applicable  Skilled Nursing Facility Not Applicable Complete Complete

## 2022-07-16 NOTE — Progress Notes (Signed)
PCCM Progress Note  Patient initially failed SBT on am round due to decreased RR, sedation further reduced which resulted in agitation. Re-rounded on patient and was able to remain at bedside to coach through vent and sedation wean. Patient was able to tolerated discontinuation of Fentanyl drip and vent wean lasting 15 mins.   Patient was extubated to [place] with cuff leak noted prior to extubation. Continue to minimize sedation but monitor for agitation.   [name] D. Tiburcio Pea, NP-C [facility] Personal contact information can be found on Amion  07/16/2022, 10:04 AM

## 2022-07-16 NOTE — Progress Notes (Signed)
[facility] ADULT ICU REPLACEMENT PROTOCOL   The patient does apply for the [facility] Adult ICU Electrolyte Replacment Protocol based on the criteria listed below:   1.Exclusion criteria: TCTS patients, ECMO patients, and Dialysis patients 2. Is GFR >/= 30 ml/min? Yes.    Patient's GFR today is >60 3. Is SCr </= 2? Yes.   Patient's SCr is 0.88 mg/dL 4. Did SCr increase >/= 0.5 in 24 hours? No. 5.Pt's weight >40kg  Yes.   6. Abnormal electrolyte(s): Mag 1.7  7. Electrolytes replaced per protocol 8.  Call MD STAT for K+ </= 2.5, Phos </= 1, or Mag </= 1 Physician:  Reyne Dumas 07/16/2022 4:38 AM

## 2022-07-16 NOTE — Progress Notes (Signed)
PCCM Progress Note   Called to bedside to assist with severe agitation episode.  On arrival to room patient was being restrained by 5 staff members for safety. He was already on a max dose of Precedex with stable vital signs so 2 mg of IV Versed was administered.  I remained at bedside until patient's agitation improved.  I again attempted to reach contact listed in chart and was able to gait with Debra.  She states she was patient's Child psychotherapist in the past and has stayed in contact with [name] since.  She reports she has received confirmation from her supervisor to remain on the [name]'s case and has inquired about obtaining guardianship for [name] but this has not been completed yet.  Reports she has no legal right to make decisions for [name] at this time but is happy to engage with staff to help determine best plan of care.  Reports patient does have children within the area but does not know contact information.   [name] is going to speak with her supervisor tomorrow regarding increasing responsibility for [name] to assist staff with making medical decisions.  In the meantime patient will remain full code for aggressive/aggressive interventions until family or guardianship can be identified.   Given severe agitation episodes requiring benzodiazepines and Precedex patient is at risk for reintubation.   Sheldon Sem D. Tiburcio Pea, NP-C [facility] Personal contact information can be found on Amion  07/16/2022, 6:51 PM

## 2022-07-16 NOTE — Progress Notes (Signed)
Nurse went to speak with primary provider Veatrice Bourbon, MD in reference to patient being uncomfortable and awake on vent. Night nurse gave a bolus of fentanyl to assist with patient. Patient also has precedex order but was given report that patient has a prolonged Qtc. Nurse is getting a 12-lead. Will continue to monitor.

## 2022-07-16 NOTE — Progress Notes (Signed)
Progress Note  Patient Name: [name] Date of Encounter: 07/16/2022  [facility] Cardiologist: Minus Breeding, MD   Subjective   Extubated.  Following commands  Inpatient Medications    Scheduled Meds:  apixaban  5 mg Per Tube BID   ARIPiprazole  5 mg Per Tube Daily   budesonide (PULMICORT) nebulizer solution  0.5 mg Nebulization Q12H   Chlorhexidine Gluconate Cloth  6 each Topical Daily   clonazePAM  0.5 mg Per Tube BID   docusate  100 mg Per Tube BID   folic acid  1 mg Intravenous Daily   furosemide  40 mg Intravenous Once   gabapentin  400 mg Per Tube TID   ipratropium-albuterol  3 mL Nebulization Q6H   multivitamin with minerals  1 tablet Per Tube Daily   mouth rinse  15 mL Mouth Rinse Q2H   pantoprazole (PROTONIX) IV  40 mg Intravenous Q12H   polyethylene glycol  17 g Per Tube Daily   thiamine (VITAMIN B1) injection  100 mg Intravenous Daily   Continuous Infusions:  cefTRIAXone (ROCEPHIN)  IV Stopped (07/16/22 0424)   dexmedetomidine (PRECEDEX) IV infusion     dextrose 50 mL/hr at 07/16/22 0600   doxycycline (VIBRAMYCIN) IV Stopped (07/16/22 [id number])   PRN Meds: guaiFENesin, hydrALAZINE, ipratropium-albuterol, metoprolol tartrate, mouth rinse, prochlorperazine, traZODone, white petrolatum   Vital Signs    Vitals:   07/16/22 1000 07/16/22 1100 07/16/22 1200 07/16/22 1300  BP: (!) 171/125 (!) 159/91 136/88 (!) 165/90  Pulse: (!) 121 (!) 114 (!) 103 (!) 101  Resp: (!) 28 18 17 19   Temp:   98.5 F (36.9 C)   TempSrc:   Oral   SpO2: 98% 100% 100% 99%  Weight:      Height:        Intake/Output Summary (Last 24 hours) at 07/16/2022 1419 Last data filed at 07/16/2022 [id number] Gross per 24 hour  Intake 2711.9 ml  Output 757 ml  Net 1954.9 ml       07/16/2022    4:02 AM 07/15/2022    4:00 AM 07/14/2022    5:00 AM  Last 3 Weights  Weight (lbs) 165 lb 9.1 oz 148 lb 9.4 oz 166 lb 10.7 oz  Weight (kg) 75.1 kg 67.4 kg 75.6 kg      Telemetry    SR with  occ PVCs - Personally Reviewed  ECG    No new - Personally Reviewed  Physical Exam   GEN: Chronically ill-appearing Neck: No JVD Cardiac: Tachycardic, regular no murmurs, rubs, or gallops.  Respiratory: Diminished breath sounds GI: Soft, nontender, non-distended  MS: No edema; status post right AKA Neuro: Following commands Psych: Unable to assess  Labs    High Sensitivity Troponin:   Recent Labs  Lab 07/13/22 1850 07/13/22 2213  TROPONINIHS 45* 38*      Chemistry Recent Labs  Lab 07/12/22 0750 07/13/22 0516 07/14/22 0355 07/15/22 0407 07/15/22 0824 07/15/22 1706 07/16/22 0357  NA 138   < > 141 143  --   --  138  K 4.0   < > 3.5 3.7  --   --  3.9  CL 111   < > 106 104  --   --  104  CO2 19*   < > 27 29  --   --  27  GLUCOSE 92   < > 81 109*  --   --  151*  BUN 13   < > 14 12  --   --  14  CREATININE 1.09   < > 1.16 1.01  --   --  0.88  CALCIUM 8.7*   < > 8.1* 8.2*  --   --  7.9*  MG  --    < > 1.4* 1.9 2.2 2.0 1.7  PROT 7.1  --   --   --   --   --   --   ALBUMIN 3.4*  --   --   --   --   --   --   AST 24  --   --   --   --   --   --   ALT 16  --   --   --   --   --   --   ALKPHOS 116  --   --   --   --   --   --   BILITOT 1.1  --   --   --   --   --   --   GFRNONAA >60   < > >60 >60  --   --  >60  ANIONGAP 8   < > 8 10  --   --  7   < > = values in this interval not displayed.     Lipids  Recent Labs  Lab 07/14/22 0355  TRIG 52     Hematology Recent Labs  Lab 07/13/22 0516 07/14/22 0355 07/16/22 0357  WBC 6.1 8.9 10.0  RBC 3.66* 3.78* 3.89*  HGB 10.9* 11.2* 11.5*  HCT 34.4* 34.8* 36.5*  MCV 94.0 92.1 93.8  MCH 29.8 29.6 29.6  MCHC 31.7 32.2 31.5  RDW 18.2* 18.1* 17.7*  PLT 239 240 267    Thyroid No results for input(s): "TSH", "FREET4" in the last 168 hours.  BNP Recent Labs  Lab 07/12/22 0812  BNP 2,089.0*     DDimer No results for input(s): "DDIMER" in the last 168 hours.   Radiology    DG Abd 1 View  Result Date:  07/15/2022 CLINICAL DATA:  NG tube placement EXAM: ABDOMEN - 1 VIEW COMPARISON:  KUB 12/17/2020 FINDINGS: The enteric catheter tip and sidehole project over the stomach. There is a nonobstructive bowel gas pattern. There is no definite free intraperitoneal air, within the confines of semi-erect technique. There is no gross organomegaly or abnormal soft tissue calcification. The imaged bones are unremarkable. There are persistent patchy opacities in the right lower lobe, similar to the prior chest radiograph. There is a possible trace right pleural effusion. IMPRESSION: 1. Enteric catheter tip and sidehole project over the stomach. 2. Persistent patchy opacities in the right lower lobe and possible trace right pleural effusion. Electronically Signed   By: Lesia Hausen M.D.   On: 07/15/2022 11:56    Cardiac Studies   Echo 07/14/22  IMPRESSIONS     1. Left ventricular ejection fraction, by estimation, is <20%. Left  ventricular ejection fraction by 3D volume is 20 %. The left ventricle has  severely decreased function. The left ventricle demonstrates global  hypokinesis. The left ventricular internal   cavity size was moderately dilated. There is mild left ventricular  hypertrophy. Left ventricular diastolic parameters are consistent with  Grade II diastolic dysfunction (pseudonormalization). Elevated left  ventricular end-diastolic pressure. The E/e' is   24.   2. Right ventricular systolic function is moderately reduced. The right  ventricular size is mildly enlarged. There is normal pulmonary artery  systolic pressure. The estimated right ventricular systolic pressure is  26.7  mmHg.   3. The mitral valve is abnormal. Trivial mitral valve regurgitation.   4. The aortic valve is tricuspid. Aortic valve regurgitation is not  visualized.   5. The inferior vena cava is normal in size with <50% respiratory  variability, suggesting right atrial pressure of 8 mmHg.   Comparison(s): No significant  change from prior study. 01/30/2022: LVEF  <20%, grade 3 DD, elevated LV filling pressure.   FINDINGS   Left Ventricle: Left ventricular ejection fraction, by estimation, is  <20%. Left ventricular ejection fraction by 3D volume is 20 %. The left  ventricle has severely decreased function. The left ventricle demonstrates  global hypokinesis. The left  ventricular internal cavity size was moderately dilated. There is mild  left ventricular hypertrophy. Left ventricular diastolic parameters are  consistent with Grade II diastolic dysfunction (pseudonormalization).  Elevated left ventricular end-diastolic  pressure. The E/e' is 24.   Right Ventricle: The right ventricular size is mildly enlarged. No  increase in right ventricular wall thickness. Right ventricular systolic  function is moderately reduced. There is normal pulmonary artery systolic  pressure. The tricuspid regurgitant  velocity is 2.16 m/s, and with an assumed right atrial pressure of 8 mmHg,  the estimated right ventricular systolic pressure is [id number] mmHg.   Left Atrium: Left atrial size was normal in size.   Right Atrium: Right atrial size was normal in size.   Pericardium: There is no evidence of pericardial effusion.   Mitral Valve: The mitral valve is abnormal. There is mild thickening of  the anterior and posterior mitral valve leaflet(s). Trivial mitral valve  regurgitation.   Tricuspid Valve: The tricuspid valve is grossly normal. Tricuspid valve  regurgitation is trivial.   Aortic Valve: The aortic valve is tricuspid. Aortic valve regurgitation is  not visualized.   Pulmonic Valve: The pulmonic valve was normal in structure. Pulmonic valve  regurgitation is not visualized.   Aorta: The aortic root and ascending aorta are structurally normal, with  no evidence of dilitation.   Venous: The inferior vena cava is normal in size with less than 50%  respiratory variability, suggesting right atrial pressure of 8  mmHg.   IAS/Shunts: No atrial level shunt detected by color flow Doppler.   Echo 01/30/22  IMPRESSIONS     1. LV apical false tendon (normal variant). Left ventricular ejection  fraction, by estimation, is <20%. The left ventricle has severely  decreased function. The left ventricle demonstrates global hypokinesis.  The left ventricular internal cavity size was   mildly dilated. There is mild left ventricular hypertrophy. Left  ventricular diastolic parameters are consistent with Grade III diastolic  dysfunction (restrictive). Elevated left ventricular end-diastolic  pressure. The E/e' is 82.   2. Right ventricular systolic function is mildly reduced. The right  ventricular size is mildly enlarged. There is moderately elevated  pulmonary artery systolic pressure. The estimated right ventricular  systolic pressure is [id number] mmHg.   3. Left atrial size was moderately dilated.   4. Right atrial size was moderately dilated.   5. The mitral valve is abnormal. Mild to moderate mitral valve  regurgitation.   6. The aortic valve is tricuspid. Aortic valve regurgitation is not  visualized. Aortic valve sclerosis is present, with no evidence of aortic  valve stenosis.   7. Mildly dilated pulmonary artery.   8. The inferior vena cava is normal in size with <50% respiratory  variability, suggesting right atrial pressure of 8 mmHg.   Comparison(s): Changes from prior study are  noted. 12/18/2020: LVEF <20%.   FINDINGS   Left Ventricle: LV apical false tendon (normal variant). Left ventricular  ejection fraction, by estimation, is <20%. The left ventricle has severely  decreased function. The left ventricle demonstrates global hypokinesis.  The left ventricular internal  cavity size was mildly dilated. There is mild left ventricular  hypertrophy. Left ventricular diastolic parameters are consistent with  Grade III diastolic dysfunction (restrictive). Elevated left ventricular  end-diastolic  pressure. The E/e' is 70.   Right Ventricle: The right ventricular size is mildly enlarged. No  increase in right ventricular wall thickness. Right ventricular systolic  function is mildly reduced. There is moderately elevated pulmonary artery  systolic pressure. The tricuspid  regurgitant velocity is 3.15 m/s, and with an assumed right atrial  pressure of 8 mmHg, the estimated right ventricular systolic pressure is  [id number] mmHg.   Left Atrium: Left atrial size was moderately dilated.   Right Atrium: Right atrial size was moderately dilated.   Pericardium: There is no evidence of pericardial effusion.   Mitral Valve: The mitral valve is abnormal. There is mild thickening of  the anterior and posterior mitral valve leaflet(s). Mild to moderate  mitral valve regurgitation, with centrally-directed jet.   Tricuspid Valve: The tricuspid valve is grossly normal. Tricuspid valve  regurgitation is trivial.   Aortic Valve: The aortic valve is tricuspid. Aortic valve regurgitation is  not visualized. Aortic valve sclerosis is present, with no evidence of  aortic valve stenosis. Aortic valve mean gradient measures 2.0 mmHg.  Aortic valve peak gradient measures 3.7   mmHg. Aortic valve area, by VTI measures 2.17 cm.   Pulmonic Valve: The pulmonic valve was grossly normal. Pulmonic valve  regurgitation is trivial.   Aorta: The aortic root and ascending aorta are structurally normal, with  no evidence of dilitation.   Pulmonary Artery: The pulmonary artery is mildly dilated.   Venous: The inferior vena cava is normal in size with less than 50%  respiratory variability, suggesting right atrial pressure of 8 mmHg.   IAS/Shunts: No atrial level shunt detected by color flow Doppler.    Patient Profile     56 y.o. 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  Assessment & Plan      Acute combined systolic and diastolic HF/NICM -  EF  [id number].  He has been noncompliant with medications and follow-up.  Also with ongoing polysubstance abuse.  Not a candidate for advanced therapies.  Co-ox 77% this morning. No evidence of cardiogenic shock.  He diuresed well, diuresis held yesterday given hypotension requiring giving fluids back.  Agree with restarting IV Lasix 40 mg today.  Will check CVP to guide diuresis.  CAD with cardiac cath at [facility] with diffuse atherosclerosis in all coronary distrubution with worst lesion 60-70% stenosis in pLCX.  Coronary CTA in 2021 mod to severe CAD. Nuc study with significant motion and therefore non diagnostic. Troponin pk 45 from CHF. On prior admits average in the 55s.  Not candidate for any further ischemic evaluation due to ongoing medication noncompliance and cocaine use  Acute hypoxic resp failure and Rt mid lung opacity per CCM.  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 Eliquis has been resumed  GI bleed and anemia of chronic disease stable Hgb see above   Hx of poly substance abuse and acute toxic and metabolic encephalopathy. Per  CCM  Rt AKA in [facility] in May.  Hx of Lt great toe amputation several dressings on Lt foot for wounds  ABIs were normal in March.  Hypokalemia and magnesium, Keep K+ 4.0 and Mg+ 2.0       For questions or updates, please contact [facility] Please consult [email or web address] for contact info under        Signed, Little Ishikawa, MD  07/16/2022, 2:19 PM

## 2022-07-16 NOTE — Procedures (Signed)
Extubation Procedure Note  Patient Details:   Name: Jaedin Regina DOB: 01/30/1966 MRN: [id number]   Airway Documentation:    Vent end date: 07/16/22 Vent end time: 0953   Evaluation  O2 sats: stable throughout Complications: No apparent complications Patient did tolerate procedure well. Bilateral Breath Sounds: Clear, Diminished   No  Positive cuff leak noted. Patient placed on [place] 4L with humidity, no stridor noted.  Forest Becker Emmalena Canny 07/16/2022, 10:01 AM

## 2022-07-16 NOTE — Progress Notes (Signed)
OT Cancellation Note  Patient Details Name: [name] MRN: [id number] DOB: 10/20/1966   Cancelled Treatment:    Reason Eval/Treat Not Completed: Patient not medically ready. Patient currently sedated on vent. Will sign off. Please re-order when patient medically stable for therapy.   Dino Borntreger L Adel Burch 07/16/2022, 7:31 AM

## 2022-07-17 ENCOUNTER — Inpatient Hospital Stay (HOSPITAL_COMMUNITY): Payer: Medicaid Other

## 2022-07-17 DIAGNOSIS — G9341 Metabolic encephalopathy: Secondary | ICD-10-CM

## 2022-07-17 DIAGNOSIS — T68XXXA Hypothermia, initial encounter: Secondary | ICD-10-CM | POA: Diagnosis not present

## 2022-07-17 DIAGNOSIS — I5023 Acute on chronic systolic (congestive) heart failure: Secondary | ICD-10-CM | POA: Diagnosis not present

## 2022-07-17 DIAGNOSIS — I5043 Acute on chronic combined systolic (congestive) and diastolic (congestive) heart failure: Secondary | ICD-10-CM | POA: Diagnosis not present

## 2022-07-17 DIAGNOSIS — R001 Bradycardia, unspecified: Secondary | ICD-10-CM

## 2022-07-17 DIAGNOSIS — R9431 Abnormal electrocardiogram [ECG] [EKG]: Secondary | ICD-10-CM | POA: Diagnosis not present

## 2022-07-17 LAB — BASIC METABOLIC PANEL
Anion gap: 7 (ref 5–15)
BUN: 11 mg/dL (ref 6–20)
CO2: 28 mmol/L (ref 22–32)
Calcium: 8.5 mg/dL — ABNORMAL LOW (ref 8.9–10.3)
Chloride: 101 mmol/L (ref 98–111)
Creatinine, Ser: 0.72 mg/dL (ref 0.61–1.24)
GFR, Estimated: >60 mL/min (ref >60)
Glucose, Bld: 353 mg/dL — ABNORMAL HIGH (ref 70–99)
Potassium: 3.6 mmol/L (ref 3.5–5.1)
Sodium: 136 mmol/L (ref 135–145)

## 2022-07-17 LAB — GLUCOSE, CAPILLARY
Glucose-Capillary: 108 mg/dL — ABNORMAL HIGH (ref 70–99)
Glucose-Capillary: 74 mg/dL (ref 70–99)
Glucose-Capillary: 77 mg/dL (ref 70–99)
Glucose-Capillary: 158 mg/dL — ABNORMAL HIGH (ref 70–99)
Glucose-Capillary: 87 mg/dL (ref 70–99)

## 2022-07-17 LAB — MAGNESIUM
Magnesium: 2.4 mg/dL (ref 1.7–2.4)
Magnesium: 2.2 mg/dL (ref 1.7–2.4)

## 2022-07-17 LAB — PHOSPHORUS
Phosphorus: 4.4 mg/dL (ref 2.5–4.6)
Phosphorus: 4.1 mg/dL (ref 2.5–4.6)

## 2022-07-17 MED ORDER — GABAPENTIN 400 MG PO CAPS
400.0000 mg | ORAL_CAPSULE | Freq: Three times a day (TID) | ORAL | Status: DC
  Administered 2022-07-18 – 2022-07-25 (×22): 400 mg via ORAL
  Filled 2022-07-17 (×25): qty 1

## 2022-07-17 MED ORDER — VITAL 1.5 CAL PO LIQD
1000.0000 mL | ORAL | Status: DC
  Filled 2022-07-17 (×12): qty 1000

## 2022-07-17 MED ORDER — POTASSIUM CHLORIDE 10 MEQ/100ML IV SOLN
10.0000 meq | INTRAVENOUS | Status: DC
  Filled 2022-07-17: qty 100

## 2022-07-17 MED ORDER — LORAZEPAM 2 MG/ML IJ SOLN
1.0000 mg | INTRAMUSCULAR | Status: DC | PRN
  Filled 2022-07-17: qty 1

## 2022-07-17 MED ORDER — CALCIUM GLUCONATE-NACL 1-0.675 GM/50ML-% IV SOLN
1.0000 g | Freq: Once | INTRAVENOUS | Status: AC
Start: 2022-07-17 — End: 2022-07-17
  Administered 2022-07-17: 1000 mg via INTRAVENOUS
  Filled 2022-07-17: qty 50

## 2022-07-17 MED ORDER — MAGNESIUM SULFATE 2 GM/50ML IV SOLN
2.0000 g | Freq: Once | INTRAVENOUS | Status: AC
  Administered 2022-07-17: 2 g via INTRAVENOUS
  Filled 2022-07-17: qty 50

## 2022-07-17 MED ORDER — ADULT MULTIVITAMIN W/MINERALS CH
1.0000 | ORAL_TABLET | Freq: Every day | ORAL | Status: DC
  Administered 2022-07-21: 1 via ORAL
  Filled 2022-07-17 (×6): qty 1

## 2022-07-17 MED ORDER — POLYETHYLENE GLYCOL 3350 17 G PO PACK
17.0000 g | PACK | Freq: Every day | ORAL | Status: DC
  Administered 2022-07-18: 17 g via ORAL
  Filled 2022-07-17 (×5): qty 1

## 2022-07-17 MED ORDER — TRAZODONE HCL 50 MG PO TABS
50.0000 mg | ORAL_TABLET | Freq: Every evening | ORAL | Status: DC | PRN
  Administered 2022-07-20: 50 mg via ORAL
  Filled 2022-07-17 (×3): qty 1

## 2022-07-17 MED ORDER — DOCUSATE SODIUM 50 MG/5ML PO LIQD: 100.0000 mg | Freq: Two times a day (BID) | ORAL | Status: DC

## 2022-07-17 MED ORDER — CLONAZEPAM 0.125 MG PO TBDP
0.5000 mg | ORAL_TABLET | Freq: Two times a day (BID) | ORAL | Status: DC
  Administered 2022-07-18 – 2022-07-25 (×13): 0.5 mg via ORAL
  Filled 2022-07-17 (×16): qty 4

## 2022-07-17 MED ORDER — ORAL CARE MOUTH RINSE: 15.0000 mL | OROMUCOSAL | Status: DC

## 2022-07-17 MED ORDER — ARIPIPRAZOLE 5 MG PO TABS
5.0000 mg | ORAL_TABLET | Freq: Every day | ORAL | Status: DC
  Administered 2022-07-18 – 2022-07-25 (×7): 5 mg via ORAL
  Filled 2022-07-17 (×8): qty 1

## 2022-07-17 MED ORDER — ORAL CARE MOUTH RINSE: 15.0000 mL | OROMUCOSAL | Status: DC | PRN

## 2022-07-17 MED ORDER — APIXABAN 5 MG PO TABS
5.0000 mg | ORAL_TABLET | Freq: Two times a day (BID) | ORAL | Status: DC
  Administered 2022-07-18 – 2022-07-25 (×13): 5 mg via ORAL
  Filled 2022-07-17 (×16): qty 1

## 2022-07-17 MED ORDER — DOCUSATE SODIUM 100 MG PO CAPS
100.0000 mg | ORAL_CAPSULE | Freq: Two times a day (BID) | ORAL | Status: DC
  Filled 2022-07-17 (×7): qty 1

## 2022-07-17 MED ORDER — POTASSIUM CHLORIDE 10 MEQ/50ML IV SOLN
10.0000 meq | INTRAVENOUS | Status: AC
  Administered 2022-07-17 (×4): 10 meq via INTRAVENOUS
  Filled 2022-07-17 (×4): qty 50

## 2022-07-17 MED ORDER — VITAL HIGH PROTEIN PO LIQD: 1000.0000 mL | ORAL | Status: DC

## 2022-07-17 NOTE — Progress Notes (Addendum)
Progress Note  Patient Name: [name] Date of Encounter: 07/17/2022  [facility] Cardiologist: Rollene Rotunda, MD   Subjective   Patient had worsening agitation over night while on max dose of Precedex and required Versed. He is currently in soft wrist restraints and receiving a breathing treatment. He is on 3L of O2 via nasal cannula and O2 sats are maintaining in the 90s. He is able to answer some questions this morning by shaking his head. He denies any chest pain but reports pain in his left leg. He still feels short of breath with the breathing treatment.  Inpatient Medications    Scheduled Meds:  apixaban  5 mg Per Tube BID   ARIPiprazole  5 mg Per Tube Daily   budesonide (PULMICORT) nebulizer solution  0.5 mg Nebulization Q12H   Chlorhexidine Gluconate Cloth  6 each Topical Daily   clonazePAM  0.5 mg Per Tube BID   docusate  100 mg Per Tube BID   folic acid  1 mg Intravenous Daily   gabapentin  400 mg Per Tube TID   ipratropium-albuterol  3 mL Nebulization Q6H   multivitamin with minerals  1 tablet Per Tube Daily   mouth rinse  15 mL Mouth Rinse Q2H   pantoprazole (PROTONIX) IV  40 mg Intravenous Q12H   polyethylene glycol  17 g Per Tube Daily   thiamine (VITAMIN B1) injection  100 mg Intravenous Daily   Continuous Infusions:  dexmedetomidine (PRECEDEX) IV infusion Stopped (07/17/22 0509)   dextrose Stopped (07/16/22 1300)   PRN Meds: guaiFENesin, hydrALAZINE, ipratropium-albuterol, metoprolol tartrate, midazolam, mouth rinse, prochlorperazine, traZODone, white petrolatum   Vital Signs    Vitals:   07/17/22 0400 07/17/22 0500 07/17/22 0512 07/17/22 0600  BP: (!) 128/91 127/88  125/88  Pulse: (!) 49 (!) 50 (!) 49 (!) 49  Resp: (!) 23 (!) 25 (!) 23 (!) 24  Temp:      TempSrc:      SpO2: 100% 100% 100% 100%  Weight:  70.9 kg    Height:        Intake/Output Summary (Last 24 hours) at 07/17/2022 0712 Last data filed at 07/17/2022 0527 Gross per 24 hour   Intake 1005.65 ml  Output 1600 ml  Net -594.35 ml      07/17/2022    5:00 AM 07/16/2022    4:02 AM 07/15/2022    4:00 AM  Last 3 Weights  Weight (lbs) 156 lb 4.9 oz 165 lb 9.1 oz 148 lb 9.4 oz  Weight (kg) 70.9 kg 75.1 kg 67.4 kg      Telemetry    Sinus bradycardia with rates in the 40s to 50s. - Personally Reviewed  ECG    No new ECG tracing today. - Personally Reviewed  Physical Exam   GEN: African-American male who appears chronically ill.   Neck: No JVD. Cardiac: Bradycardic with regular rhythm. No murmurs, rubs, or gallops.  Respiratory: Tachpneic with diffuse upper airway sounds and rhonchi but no significant crackles appreciated. GI: Soft, non-distended, and non-tender. MS: No lower extremity edema. S/p right above knee amputation. Skin: Cool to the touch but dry. Neuro:  No gross focal deficits. Able to answer questions by shaking his head. Psych: Unable to assess.   Labs    High Sensitivity Troponin:   Recent Labs  Lab 07/13/22 1850 07/13/22 2213  TROPONINIHS 45* 38*     Chemistry Recent Labs  Lab 07/12/22 0750 07/13/22 3810 07/14/22 0355 07/15/22 0407 07/15/22 0824 07/15/22 1706  07/16/22 0357 07/16/22 2300  NA 138   < > 141 143  --   --  138  --   K 4.0   < > 3.5 3.7  --   --  3.9  --   CL 111   < > 106 104  --   --  104  --   CO2 19*   < > 27 29  --   --  27  --   GLUCOSE 92   < > 81 109*  --   --  151*  --   BUN 13   < > 14 12  --   --  14  --   CREATININE 1.09   < > 1.16 1.01  --   --  0.88  --   CALCIUM 8.7*   < > 8.1* 8.2*  --   --  7.9*  --   MG  --    < > 1.4* 1.9   < > 2.0 1.7 1.8  PROT 7.1  --   --   --   --   --   --   --   ALBUMIN 3.4*  --   --   --   --   --   --   --   AST 24  --   --   --   --   --   --   --   ALT 16  --   --   --   --   --   --   --   ALKPHOS 116  --   --   --   --   --   --   --   BILITOT 1.1  --   --   --   --   --   --   --   GFRNONAA >60   < > >60 >60  --   --  >60  --   ANIONGAP 8   < > 8 10  --   --   7  --    < > = values in this interval not displayed.    Lipids  Recent Labs  Lab 07/14/22 0355  TRIG 52    Hematology Recent Labs  Lab 07/13/22 0516 07/14/22 0355 07/16/22 0357  WBC 6.1 8.9 10.0  RBC 3.66* 3.78* 3.89*  HGB 10.9* 11.2* 11.5*  HCT 34.4* 34.8* 36.5*  MCV 94.0 92.1 93.8  MCH 29.8 29.6 29.6  MCHC 31.7 32.2 31.5  RDW 18.2* 18.1* 17.7*  PLT 239 240 267   Thyroid No results for input(s): "TSH", "FREET4" in the last 168 hours.  BNP Recent Labs  Lab 07/12/22 0812  BNP 2,089.0*    DDimer No results for input(s): "DDIMER" in the last 168 hours.   Radiology    DG Abd 1 View  Result Date: 07/15/2022 CLINICAL DATA:  NG tube placement EXAM: ABDOMEN - 1 VIEW COMPARISON:  KUB 12/17/2020 FINDINGS: The enteric catheter tip and sidehole project over the stomach. There is a nonobstructive bowel gas pattern. There is no definite free intraperitoneal air, within the confines of semi-erect technique. There is no gross organomegaly or abnormal soft tissue calcification. The imaged bones are unremarkable. There are persistent patchy opacities in the right lower lobe, similar to the prior chest radiograph. There is a possible trace right pleural effusion. IMPRESSION: 1. Enteric catheter tip and sidehole project over the stomach. 2. Persistent patchy opacities in the right lower lobe and  possible trace right pleural effusion. Electronically Signed   By: Valetta Mole M.D.   On: 07/15/2022 11:56    Cardiac Studies   Echocardiogram 07/14/2022: Impressions:  1. Left ventricular ejection fraction, by estimation, is <20%. Left  ventricular ejection fraction by 3D volume is 20 %. The left ventricle has  severely decreased function. The left ventricle demonstrates global  hypokinesis. The left ventricular internal   cavity size was moderately dilated. There is mild left ventricular  hypertrophy. Left ventricular diastolic parameters are consistent with  Grade II diastolic dysfunction  (pseudonormalization). Elevated left  ventricular end-diastolic pressure. The E/e' is   24.   2. Right ventricular systolic function is moderately reduced. The right  ventricular size is mildly enlarged. There is normal pulmonary artery  systolic pressure. The estimated right ventricular systolic pressure is  [id number] mmHg.   3. The mitral valve is abnormal. Trivial mitral valve regurgitation.   4. The aortic valve is tricuspid. Aortic valve regurgitation is not  visualized.   5. The inferior vena cava is normal in size with <50% respiratory  variability, suggesting right atrial pressure of 8 mmHg.   Comparison(s): No significant change from prior study. 01/30/2022: LVEF  <20%, grade 3 DD, elevated LV filling pressure.    Patient Profile     56 y.o. 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 He has had multiple admission over the last 8 months for a variety of complaints including acute on chronic CHF. He was admitted on 07/12/2022 for acute hypoxic respiratory failure secondary to acute on chronic CHF, COPD exacerbation, and pneumonia after presenting with shortness of breath. He reported being non-compliant with his home Lasix. He ultimately required intubation on 07/13/2022 due to rising O2 requirement and refusing BiPAP due to agitation. He was able to be extubated on 07/16/2022.  Assessment & Plan    Acute on Chronic Combined CHF BNP elevated at 2,089. Chest x-ray showed findings concerning for pneumonia but no overt edema. Echo showed LVEF of <20% with global hypokinesis, grade 2 diastolic dysfunction, and elevated LVEDP. RV mildly enlarged with  moderately reduced RV systolic function and normal PASP. He has been diuresed with IV Lasix. Net negative 4.16 L this admission. Weight 156 lbs today, down 9 lbs from yesterday if accurate. Today's labs pending. CO-OX yesterday 77 - no evidence of cardiogenic shock. - Does not appear volume overloaded on exam. - CVP 4 this morning. Will recheck CO-OX this morning. - Will hold on any additional diuretics for now. - He was only on Coreg at home as far as GDMT goes. Can try to restart oral medications although I suspect non-compliance is going to continue to be an issue. Would avoid beta-blocker right now given bradycardia but could try to add Losartan or Spironolactone. - Continue to monitor daily weights, strict I/Os, and renal function. - He has a long history of non-compliance, on-going cocaine abuse, and being loss to follow-up and is not a candidate for advanced therapies.  CAD Prior cath at [facility] in 2020 showed moderate non-obstructive CAD. Coronary CTA in 03/2020 showed moderate to severe disease of RCA, LAD, and LCX. However, not felt to be candidate for invasive ischemic work-up  due to noncompliance and cocaine abuse. High-sensitivity troponin minimally elevated and flat at 45 >> 38 consistent with demand ischemia and prior trends.  - No chest pain. - No aspirin due to need for Eliquis. - Not on a statin here or at home. Given significant non-compliance, I don't think there is much utility in trying to restart this. I think we should focus more on diuretic and GDMT that will help keep fluid off and keep him out of the hospital.  Sinus Bradycardia Felt to be due to Precedex drip which has now been stopped. - BP stable. - Continue to monitor.  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 - On Eliquis 5mg  twice daily. This was initially held due to coffee ground drainage from OT but has now been resumed.   Hypertension BP somewhat labile at times but mostly well  controlled. - Can try to restart GDMT as above as he continues to improve but I suspect non-compliance is going to continue to be an issue.  Prolonged Qtc QTc 520 ms on initial EKG but improved to 475 ms on EKG yesterday. However, it looks long on telemetry today. - Will repeat EKG. - Potassium 3.6. Magnesium 1.8 yesterday. - Discussed with Critical Care. They are stopping Precedex and giving Magnesium. - Continue to monitor.  Otherwise, per primary team: - Acute hypoxic respiratory failure  - COPD/asthma - Acute toxic and metabolic encephalopathy  - Polysubstance abuse with substance and ETOH withdrawal - Anemia of chronic disease - S/p right above knee amputation    For questions or updates, please contact [place] Please consult [email or web address] for contact info under        Signed, Darreld Mclean, PA-C  07/17/2022, 7:12 AM    Patient seen and examined.  Agree with above documentation.  On exam, patient is oriented to person and place, regular rate and rhythm, no murmurs, diminished breath sounds, no LE edema.  CVP 4 today, can hold on diuresis.  Will check EKG to monitor QT.  Donato Heinz, MD

## 2022-07-17 NOTE — Progress Notes (Signed)
Elink called.  Pt refusing any interventions at this time.  Agitated when approached and refusing all IV and PO medications.  Not wearing telemetry or oxygen.  Elink provider ordered Ativan per CIWA.  Pt easily woken but argumentative.  Continues to say "I am good" and "I am only going to say it one more time, I am good" when this RN attempted to apply telemetry, oxygen and give medications.  CN aware of situation.  Pt laying calmly when left alone but increasingly agitated with any staff interactions.

## 2022-07-17 NOTE — Progress Notes (Signed)
eLink Physician-Brief Progress Note Patient Name: [name] DOB: [birth year] MRN: [id number]   Date of Service  07/17/2022  HPI/Events of Note  Pt is not oriented. He is agitated. He is refusing tele leads, refusing meds, refusing care. Not very cooperative and not taking in orally  Pt has CIWA q6 but he has versed PRN but they can not give versed on that floo  eICU Interventions  Ativan 1-2 mg IV q 2h prn  Follow CIWA     Intervention Category Intermediate Interventions: Change in mental status - evaluation and management  Arfa Lamarca V. Mayra Jolliffe 07/17/2022, 11:02 PM

## 2022-07-17 NOTE — Progress Notes (Signed)
Patient transferred to room 1429 via CCU RN. SOB at initial assessment and refusing assistance to transfer.  PT rolled into bed and was positioned into sitting position with HOB at 45 degrees.  Refusing to allow staff to place telemetry monitor.  CN at bedside and aware.  VS obtained however would not allow pulse ox to be checked at this time.  Simpson at 4L.  Resp called for breathing treatment and patient given ice cream per request.  A&Ox3.  Does not fully understand his current situation.  NP on call notified of refusals and current status.  Will continue to monitor.

## 2022-07-17 NOTE — Progress Notes (Signed)
NAME:  [name], MRN:  [id number], DOB:  1966/05/11, LOS: 4 ADMISSION DATE:  07/12/2022, CONSULTATION DATE:  07/13/22 REFERRING MD:  Nelson Chimes, CHIEF COMPLAINT:  dyspnea   History of Present Illness:  56yM with history of asthma, bipolar 1 disorder, depression, schizoaffective disorder, chronic congestive heart failure, homelessness, GERD, hypertension, QT prolongation, type 2 diabetes, alcohol abuse, cocaine and cannabis abuse, tobacco use, chronic combined systolic and diastolic heart failure, right AKA who presented to ED with gradually worsening dyspnea, LE edema, chest pressure and productive cough.   He was admitted and started on ABX for possible CAP, diuresed for acute on chronic systolic heart failure. Oxygen requirement has risen today from 3L to 8L and he's refused BiPAP.  Agitation requiring a couple doses of IV ativan per CIWA scoring.  Pertinent  Medical History  Asthma BiPD/substance induced mood disturbance CHF GERD DM2 Polysubstance use/alcohol use Homelessness DVT  Significant Hospital Events: Including procedures, antibiotic start and stop dates in addition to other pertinent events   8/12 admitted started on ceftriaxone, doxy, diuresed 8/13 increasing O2 requirement to 8L, refusing bipap, agitation requiring a couple CIWA based doses of ativan 8/14 Some issues with agitation overnight, currently on Fentanyl and Versed drips 8/15 Issues with hypoglycemia and mild hypotension overnight, resolved with IV hydration 8/16 No issues overnight, now off versed drip. Extubated  8/17   Interim History / Subjective:   Agitated following extubation yesterday On dex gtt and PRNs for this   There is a nursing note that the pts HR is "not sustaining." He is bradycardic which is attributable to dex gtt, and is normotensive.   Qtc is prolonged this morning   Objective   Blood pressure (!) 124/90, pulse (!) 48, temperature (!) 92.2 F (33.4 C), temperature source Rectal, resp.  rate (!) 21, height 5\' 9"  (1.753 m), weight 70.9 kg, SpO2 100 %. CVP:  [3 mmHg-16 mmHg] 4 mmHg      Intake/Output Summary (Last 24 hours) at 07/17/2022 0920 Last data filed at 07/17/2022 07/19/2022 Gross per 24 hour  Intake 965.65 ml  Output 1600 ml  Net -634.35 ml   Filed Weights   07/15/22 0400 07/16/22 0402 07/17/22 0500  Weight: 67.4 kg 75.1 kg 70.9 kg    Examination: General: chronically and acutely ill middle aged M HEENT: NCAT pink tacky mm  Neuro: awake disoriented. Moving BUE BLE not following commands reliably.  CV: bradycardic s1s2 cap refill sluggish  PULM:  even unlabored. Scattered upper lobe rhonchi  GI: soft ndnt  Extremities: R AKA  Skin: cool to touch   Resolved Hospital Problem list   Coffee Ground OG output   Assessment & Plan:   Acute metabolic encephalopathy  ?ICU delirium Polysubstance use disorder  P: -delirium precautions -dc dex gtt (qtc)  -micronutrient support -PRN BZD   Acute respiratory failure with hypoxia RML opacity  Possible asthma/copd overlap -extubated 8/16 -rocephin started 8/12, doxy added 8/13 P: -pulm hygiene -IS, mobility  -rocephin, doxy  Acute on chronic systolic and diastolic HF CAD  Prolonged qtc  -COOX 8/13 with O2 sats 68.8 -Pre cardiology "Unfortunate situation, he is not a candidate for advanced therapies for his heart failure given his noncompliance and polysubstance abuse." -Repeat ECHO 8/14 continues to display EH < 20 with severely decreased function with global hypokinesis and grade II diastolic dysfunction  P: -dc dex gtt, review and dc other qt prolonging agents as able -2g mag -holding diuretics  -no ASA -- on eliquis   Bradycardia  Hypothermia -? Component of iatrogenic brady 2/2 dex gtt, vs compounded by hypothermia P -bair hugger -could do warm fluids if needed -dc dex as above   Hx DVT  -Anticoagulated at baseline with Eliquis  P: Continue home Eliquis   Chronic anemia, chronic dz   P: -PRN CBC   S/p R AKA  LLE wound P: -supportive care  -WOC  Hyperglycemia, iatrogenic -on d10 gtt P -decr d10 to 61ml/hr -no addition of SSI yet   Inadequate PO intake -insert small bore ngt, EN per RDN -dc d10 gtt when EN started, decreasing to [facility] now with acute hyperglycemia   Best Practice (right click and "Reselect all SmartList Selections" daily)   Diet/type: tubefeeds DVT prophylaxis: other - Eliquis  GI prophylaxis: PPI Lines: Central line L subclavian  Foley:  Yes, and it is still needed Code Status:  full code Last date of multidisciplinary goals of care discussion: Pending family discussion. Attempted to call contact listed in chart with no response 8/15. Will try again daily   CRITICAL CARE Performed by: Lanier Clam   Total critical care time: 35 minutes  Critical care time was exclusive of separately billable procedures and treating other patients. Critical care was necessary to treat or prevent imminent or life-threatening deterioration.  Critical care was time spent personally by me on the following activities: development of treatment plan with patient and/or surrogate as well as nursing, discussions with consultants, evaluation of patient's response to treatment, examination of patient, obtaining history from patient or surrogate, ordering and performing treatments and interventions, ordering and review of laboratory studies, ordering and review of radiographic studies, pulse oximetry and re-evaluation of patient's condition.  Tessie Fass MSN, AGACNP-BC [facility] Pulmonary/Critical Care Medicine Amion for pager  07/17/2022, 9:20 AM

## 2022-07-17 NOTE — Progress Notes (Signed)
Nutrition Follow-up  DOCUMENTATION CODES:   Not applicable  INTERVENTION:  - once small bore NGT placement confirmed: Vital 1.5 @ 30 ml/hr to advance by 10 ml every 4 hours to reach goal rate of 60 ml/hr.  - at goal rate, this regimen will provide 2160 kcal, 97 grams protein, and 1100 ml free water.   NUTRITION DIAGNOSIS:   Inadequate oral intake related to inability to eat as evidenced by NPO status. -ongoing  GOAL:   Patient will meet greater than or equal to 90% of their needs -unable to meet at this time  MONITOR:   TF tolerance, Labs, Weight trends, Skin  REASON FOR ASSESSMENT:   Consult Enteral/tube feeding initiation and management  ASSESSMENT:   56 year-old male with medical history of asthma, bipolar 1 disorder, depression, schizoaffective disorder, CHF, homelessness, GERD, HTN, QT prolongation, type 2 DM, alcohol abuse, cocaine and cannabis abuse, tobacco use, and R AKA. He presented to the ED due to gradually worsening dyspnea, LE edema, chest pressure, and productive cough. He was admitted due to need for abx for CAP and diuresis d/t acute on chronic CHF. He initially required 3L O2 and O2 requirement increased to 8L; patient refused BiPAP. He was ultimately intubated 8/13 evening.  Patient was extubated yesterday and OGT removed at that time. Patient discussed one-on-one with RN and in rounds this AM. RN able to place small bore NGT at bedside; pending abdominal x-ray for confirmation.    He is noted to be disoriented x4. No visitors present at this time.  Estimated nutrition needs updated. Weight today -11 lb compared to weight on 8/13. Mild pitting edema to LLE documented in the edema section of flow sheet.    Labs reviewed; Ca: 8.5 mg/dl.  Medications reviewed; 100 mg colace BID, 1 mg folic acid/day, 40 mg IV lasix x1 dose 8/16, 1 tablet multivitamin with minerals/day per tube, 40 mg IV protonix BID, 17 g miralax/day, 10 mEq IV KCl x4 runs 8/17, 100 mg IV  thiamine/day.  IVF; D10 @ 10 ml/hr (82 kcal/24 hrs).   Diet Order:   Diet Order             Diet Heart Room service appropriate? Yes; Fluid consistency: Thin  Diet effective now                   EDUCATION NEEDS:   Not appropriate for education at this time  Skin:  Skin Assessment: Skin Integrity Issues: Skin Integrity Issues:: Other (Comment) Other: L ankle venous stasis wound  Last BM:  8/13 (type 6 x2, one medium amount and one large amount; type 5 x1, smear)  Height:   Ht Readings from Last 1 Encounters:  07/13/22 5\' 9"  (1.753 m)    Weight:   Wt Readings from Last 1 Encounters:  07/17/22 70.9 kg     BMI:  Body mass index is 23.08 kg/m.  Estimated Nutritional Needs:  Kcal:  2100-2300 kcal Protein:  95-110 grams Fluid:  >/= 1.8 L/day     07/19/22, MS, RD, LDN, CNSC Registered Dietitian II Inpatient Clinical Nutrition RD pager # and on-call/weekend pager # available in [facility]

## 2022-07-17 NOTE — Progress Notes (Signed)
Patient continues to be agitated and combative this shift. Heart rate not sustaining on precedex. Elink provider aware. Continue using Versed pushes as needed.

## 2022-07-17 NOTE — Progress Notes (Signed)
PT Cancellation Note  Patient Details Name: [name] MRN: [id number] DOB: 12-18-1965   Cancelled Treatment:    Reason Eval/Treat Not Completed: Medical issues which prohibited therapy,  has been agitated, will follow.  Blanchard Kelch PT Acute Rehabilitation Services Office [phone] Weekend pager-[phone]    Rada Hay 07/17/2022, 12:19 PM

## 2022-07-18 DIAGNOSIS — R079 Chest pain, unspecified: Secondary | ICD-10-CM

## 2022-07-18 DIAGNOSIS — I5043 Acute on chronic combined systolic (congestive) and diastolic (congestive) heart failure: Secondary | ICD-10-CM

## 2022-07-18 DIAGNOSIS — F1910 Other psychoactive substance abuse, uncomplicated: Secondary | ICD-10-CM | POA: Diagnosis not present

## 2022-07-18 LAB — GLUCOSE, CAPILLARY
Glucose-Capillary: 100 mg/dL — ABNORMAL HIGH (ref 70–99)
Glucose-Capillary: 109 mg/dL — ABNORMAL HIGH (ref 70–99)
Glucose-Capillary: 110 mg/dL — ABNORMAL HIGH (ref 70–99)
Glucose-Capillary: 114 mg/dL — ABNORMAL HIGH (ref 70–99)
Glucose-Capillary: 118 mg/dL — ABNORMAL HIGH (ref 70–99)
Glucose-Capillary: 151 mg/dL — ABNORMAL HIGH (ref 70–99)

## 2022-07-18 LAB — PHOSPHORUS
Phosphorus: 3.5 mg/dL (ref 2.5–4.6)
Phosphorus: 4.7 mg/dL — ABNORMAL HIGH (ref 2.5–4.6)

## 2022-07-18 LAB — CBC
HCT: 34 % — ABNORMAL LOW (ref 39.0–52.0)
Hemoglobin: 10.9 g/dL — ABNORMAL LOW (ref 13.0–17.0)
MCH: 29.4 pg (ref 26.0–34.0)
MCHC: 32.1 g/dL (ref 30.0–36.0)
MCV: 91.6 fL (ref 80.0–100.0)
Platelets: 293 10*3/uL (ref 150–400)
RBC: 3.71 MIL/uL — ABNORMAL LOW (ref 4.22–5.81)
RDW: 17.2 % — ABNORMAL HIGH (ref 11.5–15.5)
WBC: 7.4 10*3/uL (ref 4.0–10.5)
nRBC: 0 % (ref 0.0–0.2)

## 2022-07-18 LAB — BASIC METABOLIC PANEL
Anion gap: 4 — ABNORMAL LOW (ref 5–15)
BUN: 12 mg/dL (ref 6–20)
CO2: 27 mmol/L (ref 22–32)
Calcium: 8 mg/dL — ABNORMAL LOW (ref 8.9–10.3)
Chloride: 106 mmol/L (ref 98–111)
Creatinine, Ser: 0.79 mg/dL (ref 0.61–1.24)
GFR, Estimated: >60 mL/min (ref >60)
Glucose, Bld: 118 mg/dL — ABNORMAL HIGH (ref 70–99)
Potassium: 3.6 mmol/L (ref 3.5–5.1)
Sodium: 137 mmol/L (ref 135–145)

## 2022-07-18 LAB — MAGNESIUM
Magnesium: 1.9 mg/dL (ref 1.7–2.4)
Magnesium: 2.1 mg/dL (ref 1.7–2.4)

## 2022-07-18 LAB — TROPONIN I (HIGH SENSITIVITY): Troponin I (High Sensitivity): 18 ng/L — ABNORMAL HIGH (ref <18)

## 2022-07-18 MED ORDER — POTASSIUM CHLORIDE CRYS ER 20 MEQ PO TBCR
20.0000 meq | EXTENDED_RELEASE_TABLET | Freq: Every day | ORAL | Status: DC
  Administered 2022-07-18: 20 meq via ORAL
  Filled 2022-07-18 (×2): qty 1

## 2022-07-18 MED ORDER — OXYCODONE HCL 5 MG PO TABS
5.0000 mg | ORAL_TABLET | Freq: Four times a day (QID) | ORAL | Status: DC | PRN
  Administered 2022-07-18 – 2022-07-19 (×4): 10 mg via ORAL
  Filled 2022-07-18 (×5): qty 2

## 2022-07-18 MED ORDER — SODIUM CHLORIDE 0.9% FLUSH
10.0000 mL | INTRAVENOUS | Status: DC | PRN
  Administered 2022-07-18 – 2022-07-19 (×2): 10 mL

## 2022-07-18 MED ORDER — OXYCODONE HCL 5 MG PO TABS
5.0000 mg | ORAL_TABLET | Freq: Four times a day (QID) | ORAL | Status: DC | PRN
  Administered 2022-07-18: 5 mg via ORAL
  Filled 2022-07-18: qty 1

## 2022-07-18 MED ORDER — FUROSEMIDE 40 MG PO TABS
40.0000 mg | ORAL_TABLET | Freq: Every day | ORAL | Status: DC
  Administered 2022-07-18 – 2022-07-19 (×2): 40 mg via ORAL
  Filled 2022-07-18 (×2): qty 1

## 2022-07-18 NOTE — Consult Note (Addendum)
WOC consult requested for left leg.   This was already performed on 8/13; please refer to previous progress notes for assessment and measurements, and topical treatment orders have been provided for the bedside nurses to perform as follows:  Cleanse using a soap and water cleanse to the left LE, rinse and dry followed by placement of calcium alginate dressings that are known to assist with LE pain. These are to be covered with dry dressings and secured with Kerlix roll gauze topped with a 6-inch ACE bandage. Heel is to be floated in a Prevalon boot.  Please re-consult if further assistance is needed.  Thank-you,  Cammie Mcgee MSN, RN, CWOCN, [place], CNS [phone]

## 2022-07-18 NOTE — Progress Notes (Addendum)
Progress Note  Patient Name: [name] Date of Encounter: 07/18/2022  [facility] Cardiologist: Rollene Rotunda, MD   Subjective   Patient was moved out of the ICU last night. Per nursing notes over night, patient was agitated and refused any medications and interventions at the time. I am familiar with this patient from his previous hospitalization in 03/2022 and he did the same thing then.   Patient resting comfortably when I walked into his room this morning but states he is not feeling well. When asked what was a matter, he just said I don't feel well. He is still having left foot. He also reports intermittent sharp chest pain that will last for a couple of minutes and then go away. No pain at this time. He was having this during his admission back in April as well. He states it is stable and does not seem to be getting worse. He states is breathing is actually OK right now.  Inpatient Medications    Scheduled Meds:  apixaban  5 mg Oral BID   ARIPiprazole  5 mg Oral Daily   budesonide (PULMICORT) nebulizer solution  0.5 mg Nebulization Q12H   Chlorhexidine Gluconate Cloth  6 each Topical Daily   clonazePAM  0.5 mg Oral BID   docusate sodium  100 mg Oral BID   folic acid  1 mg Intravenous Daily   gabapentin  400 mg Oral TID   ipratropium-albuterol  3 mL Nebulization Q6H   multivitamin with minerals  1 tablet Oral Daily   mouth rinse  15 mL Mouth Rinse 4 times per day   pantoprazole (PROTONIX) IV  40 mg Intravenous Q12H   polyethylene glycol  17 g Oral Daily   thiamine (VITAMIN B1) injection  100 mg Intravenous Daily   Continuous Infusions:  dextrose 10 mL/hr at 07/18/22 0018   feeding supplement (VITAL 1.5 CAL)     PRN Meds: guaiFENesin, hydrALAZINE, ipratropium-albuterol, LORazepam, metoprolol tartrate, mouth rinse, oxyCODONE, prochlorperazine, traZODone, white petrolatum   Vital Signs    Vitals:   07/17/22 2056 07/18/22 0026 07/18/22 0522 07/18/22 0808  BP:  109/80 91/70 100/67   Pulse: 89 100 83   Resp: (!) 24  20   Temp: 97.9 F (36.6 C) 98.8 F (37.1 C) 98.1 F (36.7 C)   TempSrc: Oral Axillary Oral   SpO2:  98% 94% (!) 70%  Weight:      Height:        Intake/Output Summary (Last 24 hours) at 07/18/2022 0836 Last data filed at 07/18/2022 0520 Gross per 24 hour  Intake 299.83 ml  Output 800 ml  Net -500.17 ml      07/17/2022    5:00 AM 07/16/2022    4:02 AM 07/15/2022    4:00 AM  Last 3 Weights  Weight (lbs) 156 lb 4.9 oz 165 lb 9.1 oz 148 lb 9.4 oz  Weight (kg) 70.9 kg 75.1 kg 67.4 kg      Telemetry    Sinus rhythm with rates in the 80s to low 100s. PVCs and ventricular couplets noted. - Personally Reviewed  ECG    No new ECG tracing today. - Personally Reviewed  Physical Exam   GEN: African-American male who appear chronically ill.    Neck: No JVD. Cardiac: RRR. No murmurs, rubs, or gallops.  Respiratory: Clear to auscultation bilaterally. No wheezes, rhonchi, or rales. GI: Soft, non-distended, and non-tender. MS: No lower extremity edema. S/p right above knee amputation. Skin: Warm and dry. Neuro:  No focal deficits. Psych: Normal affect. Responds appropriately.  Labs    High Sensitivity Troponin:   Recent Labs  Lab 07/13/22 1850 07/13/22 2213  TROPONINIHS 45* 38*     Chemistry Recent Labs  Lab 07/12/22 0750 07/13/22 0516 07/15/22 0407 07/15/22 0824 07/16/22 0357 07/16/22 2300 07/17/22 0817 07/17/22 1314 07/17/22 1700  NA 138   < > 143  --  138  --  136  --   --   K 4.0   < > 3.7  --  3.9  --  3.6  --   --   CL 111   < > 104  --  104  --  101  --   --   CO2 19*   < > 29  --  27  --  28  --   --   GLUCOSE 92   < > 109*  --  151*  --  353*  --   --   BUN 13   < > 12  --  14  --  11  --   --   CREATININE 1.09   < > 1.01  --  0.88  --  0.72  --   --   CALCIUM 8.7*   < > 8.2*  --  7.9*  --  8.5*  --   --   MG  --    < > 1.9   < > 1.7 1.8  --  2.4 2.2  PROT 7.1  --   --   --   --   --   --   --    --   ALBUMIN 3.4*  --   --   --   --   --   --   --   --   AST 24  --   --   --   --   --   --   --   --   ALT 16  --   --   --   --   --   --   --   --   ALKPHOS 116  --   --   --   --   --   --   --   --   BILITOT 1.1  --   --   --   --   --   --   --   --   GFRNONAA >60   < > >60  --  >60  --  >60  --   --   ANIONGAP 8   < > 10  --  7  --  7  --   --    < > = values in this interval not displayed.    Lipids  Recent Labs  Lab 07/14/22 0355  TRIG 52    Hematology Recent Labs  Lab 07/13/22 0516 07/14/22 0355 07/16/22 0357  WBC 6.1 8.9 10.0  RBC 3.66* 3.78* 3.89*  HGB 10.9* 11.2* 11.5*  HCT 34.4* 34.8* 36.5*  MCV 94.0 92.1 93.8  MCH 29.8 29.6 29.6  MCHC 31.7 32.2 31.5  RDW 18.2* 18.1* 17.7*  PLT 239 240 267   Thyroid No results for input(s): "TSH", "FREET4" in the last 168 hours.  BNP Recent Labs  Lab 07/12/22 0812  BNP 2,089.0*    DDimer No results for input(s): "DDIMER" in the last 168 hours.   Radiology    DG Abd 1 View  Result Date: 07/17/2022 CLINICAL DATA:  Nasogastric tube placement. EXAM: ABDOMEN - 1 VIEW COMPARISON:  07/15/2022 FINDINGS: The previously seen nasogastric tube has been removed. Interval weighted feeding tube with its tip in the proximal stomach laterally on the left. Unremarkable bowel gas pattern. Bibasilar atelectasis with mild improvement on the left and mild worsening on right. Unremarkable bones thoracolumbar spine degenerative changes. IMPRESSION: Feeding tube tip in the proximal stomach. Electronically Signed   By: Claudie Revering M.D.   On: 07/17/2022 12:36    Cardiac Studies   Echocardiogram 07/14/2022: Impressions:  1. Left ventricular ejection fraction, by estimation, is <20%. Left  ventricular ejection fraction by 3D volume is 20 %. The left ventricle has  severely decreased function. The left ventricle demonstrates global  hypokinesis. The left ventricular internal   cavity size was moderately dilated. There is mild left  ventricular  hypertrophy. Left ventricular diastolic parameters are consistent with  Grade II diastolic dysfunction (pseudonormalization). Elevated left  ventricular end-diastolic pressure. The E/e' is   24.   2. Right ventricular systolic function is moderately reduced. The right  ventricular size is mildly enlarged. There is normal pulmonary artery  systolic pressure. The estimated right ventricular systolic pressure is  [id number] mmHg.   3. The mitral valve is abnormal. Trivial mitral valve regurgitation.   4. The aortic valve is tricuspid. Aortic valve regurgitation is not  visualized.   5. The inferior vena cava is normal in size with <50% respiratory  variability, suggesting right atrial pressure of 8 mmHg.   Comparison(s): No significant change from prior study. 01/30/2022: LVEF  <20%, grade 3 DD, elevated LV filling pressure.   Patient Profile     56 y.o. 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 He has had multiple admission over the last 8 months for a variety of complaints including acute on chronic CHF. He was admitted on 07/12/2022 for acute hypoxic respiratory failure secondary to acute on chronic CHF, COPD exacerbation, and pneumonia after presenting with shortness of breath. He reported being non-compliant with his home Lasix. He ultimately required intubation on 07/13/2022 due to rising O2 requirement and refusing BiPAP due to agitation. He was able to be extubated on 07/16/2022.  Assessment & Plan    Acute on Chronic Combined CHF BNP elevated at 2,089. Chest x-ray showed findings concerning for pneumonia but no overt edema. Echo  showed LVEF of <20% with global hypokinesis, grade 2 diastolic dysfunction, and elevated LVEDP. RV mildly enlarged with moderately reduced RV systolic function and normal PASP. He has been diuresed with IV Lasix. Net negative 4.16 L this admission. Weight 156 lbs today, down 9 lbs from yesterday if accurate. Today's labs pending. CO-OX yesterday 77 - no evidence of cardiogenic shock. - JVD mildly elevated on exam, will start PO lasix 40 mg daily  - He was only on Coreg at home as far as GDMT goes. Can try to restart oral medications although I suspect non-compliance is going to continue to be an issue. BP is soft at time with systolic BP in the low 0000000 so will hold on adding GDMT right now. - Continue to monitor daily weights, strict I/Os, and renal function. - He has a long history of non-compliance,  on-going cocaine abuse, and being loss to follow-up and is not a candidate for advanced therapies. This is an unfortunate situation. He is still homeless and living on the "streets." I fear compliance is going to be an ongoing issues and he is going to be at high risk of decompensation/readmission.    CAD Prior cath at [facility] in 2020 showed moderate non-obstructive CAD. Coronary CTA in 03/2020 showed moderate to severe disease of RCA, LAD, and LCX. However, not felt to be candidate for invasive ischemic work-up due to noncompliance and cocaine abuse. High-sensitivity troponin minimally elevated and flat at 45 >> 38 consistent with demand ischemia and prior trends.  - He reports intermittent sharp chest pain that last for a couple of minutes and then resolves. This is a long standing issue. He was having this during his admission in 03/2022 as well.  Reporting chest pain this morning, check EKG, troponin - No aspirin due to need for Eliquis. - Not on a statin here or at home. Given significant non-compliance, I don't think there is much utility in trying to restart this. I think we should focus more on diuretic  and GDMT that will help keep fluid off and keep him out of the hospital. - Not a candidate for invasive ischemic evaluation given significant non-compliance and ongoing cocaine abuse.   Sinus Bradycardia - Resolved Secondary to Precedex drip which has now been stopped. Heart rate have now improved are in the 80s to low 100s. - Continue to monitor.   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 - On Eliquis 5mg  twice daily. This was initially held due to coffee ground drainage from OT but has now been resumed.   Hypertension BP soft at times but stable.  - Will hold off on adding GDMT right now given systolic BP in the low 90s.   Prolonged QTc QTc 520 ms on initial EKG and then was 602 ms on EKG yesterday. This was felt secondary to the Precedex drip which was stopped.    - Potassium and Magnesium were within normal limits yesterday. Today's labs pending. - Will repeat EKG.  Otherwise, per primary team: - Acute hypoxic respiratory failure  - COPD/asthma - Acute toxic and metabolic encephalopathy  - Polysubstance abuse with substance and ETOH withdrawal - Anemia of chronic disease - S/p right above knee amputation   For questions or updates, please contact [facility] Please consult [email or web address] for contact info under        Signed, , PA-C  07/18/2022, 8:36 AM     Patient seen and examined.  Agree with above documentation.  On exam, patient is alert and orientedx2, regular rate and rhythm, no murmurs, lungs CTAB, no LE edema, + JVD.  Patient is refusing some of his meds and declined labs this morning.  Discussed with him and he is agreeable to having labs drawn.  He appears mildly hypervolemic on exam, will restart p.o. Lasix 40 mg daily.  Hold off on GDMT given soft BP.  He is reporting atypical chest pain.  Check EKG, troponin   07/20/2022, MD

## 2022-07-18 NOTE — Progress Notes (Signed)
Patient awake and oriented this morning.  Agreeable to treatment and requesting pain medicine for his foot and some breakfast.  Linens changed without incident. Following commands and able to take PO at this time.  E-link called and notified of patient's requests.  Waiting for response from MD.

## 2022-07-18 NOTE — TOC Progression Note (Signed)
Transition of Care [facility]) - Progression Note    Patient Details  Name: [name] MRN: [id number] Date of Birth: December 10, 1965  [facility] CM/SW Contact  Lavenia Atlas, RN Phone Number: 07/18/2022, 4:07 PM  Clinical Narrative:   RNCM spoke with patient who is requesting community SW, patient wants assistance with getting transportation and medication set up on outpatient bases.  This RNCM called Corrie Dandy at [facility] to make referral.   No additional TOC needs at this time.    Expected Discharge Plan: Homeless Shelter Barriers to Discharge: No Barriers Identified  Expected Discharge Plan and Services Expected Discharge Plan: Homeless Shelter       Living arrangements for the past 2 months: Hotel/Motel, Homeless Shelter                                       Social Determinants of Health (SDOH) Interventions    Readmission Risk Interventions    04/08/2022    1:43 PM 03/25/2022    5:39 PM 02/04/2022    4:41 PM  Readmission Risk Prevention Plan  Transportation Screening Complete Complete Complete  Medication Review [employer]) Complete Referral to Pharmacy Complete  PCP or Specialist appointment within 3-5 days of discharge Complete Complete Complete  HRI or Home Care Consult Not Complete Not Complete Complete  HRI or Home Care Consult Pt Refusal Comments Pt is homeless Patient is homeless   SW Recovery Care/Counseling Consult Complete Complete Complete  Palliative Care Screening Not Applicable Not Applicable Not Applicable  Skilled Nursing Facility Not Applicable Complete Complete

## 2022-07-18 NOTE — Progress Notes (Signed)
PROGRESS NOTE  [name] [id number] DOB: 06-18-1966 DOA: 07/12/2022 PCP: Marliss Coots, NP   LOS: 5 days   Brief Narrative / Interim history: 56yM with history of asthma, bipolar 1 disorder, depression, schizoaffective disorder, chronic congestive heart failure, homelessness, GERD, hypertension, QT prolongation, type 2 diabetes, alcohol abuse, cocaine and cannabis abuse, tobacco use, chronic combined systolic and diastolic heart failure, right AKA who presented to ED with gradually worsening dyspnea, LE edema, chest pressure and productive cough.  Hospital course complicated by ventilator dependent respiratory failure, eventually being extubated on 8/16.  Hospital course also complicated by persistent agitation requiring Precedex drip, causing bradycardia and eventually discontinued.  He was transferred to the hospitalist service on 8/18  [facility] Events:  8/12 admitted started on ceftriaxone, doxy, diuresed 8/13 increasing O2 requirement to 8L, refusing bipap, agitation requiring a couple CIWA based doses of ativan 8/14 Some issues with agitation overnight, currently on Fentanyl and Versed drips 8/15 Issues with hypoglycemia and mild hypotension overnight, resolved with IV hydration 8/16 No issues overnight, now off versed drip. Extubated  8/18 Transfer to hospitalist service  Subjective / 24h Interval events: Calm this morning, tells me he feels short of breath.  Complains of intermittent chest pains.  Assesement and Plan: Principal Problem:   Acute on chronic systolic congestive heart failure (HCC) Active Problems:   COPD with acute exacerbation (HCC)   Major depressive disorder, recurrent episode (HCC)   Alcohol use disorder, mild, abuse   Homelessness   Schizoaffective disorder, bipolar type ([place])   Chest pain of uncertain etiology   Acute hypoxemic respiratory failure (HCC)   Hyperlipidemia   Polysubstance abuse ([place])   Prolonged QT interval   History  of DVT (deep vein thrombosis)   CAP (community acquired pneumonia)   Pressure injury of skin   Acute on chronic systolic (congestive) heart failure (HCC)  Principal problem Acute hypoxic respiratory failure due to acute on chronic combined CHF, possible asthma/COPD overlap-PCCM initially consulted, he was admitted to the ICU and had to be intubated.  Eventually extubated 8/16.  He also has completed antibiotics with ceftriaxone and doxycycline, received 5 days with end date 8/16.  Active problems Acute on chronic combined CHF-2D echo showed LVEF less than 20% with global hypokinesis, grade 2 diastolic dysfunction.  Cardiology consulted and following.  Has been initially diuresed, but now converted to p.o. Lasix.  Noncompliance has been the main issue, and also he is homeless and continues to abuse cocaine.  Had some chest pain overnight, obtain EKG/troponin per cardiology.  Coronary artery disease-most recent cath at [facility] in 2020 showed moderate nonobstructive CAD, coronary CTA in 2021 showed moderate to severe RCA disease, LAD and LCx.  Felt not to be a candidate for invasive ischemic work-up due to noncompliance and cocaine abuse.  Troponins are flat, likely representing demand ischemia  Sinus bradycardia-due to Precedex, now discontinued.  Acute metabolic encephalopathy-likely related to ICU delirium, underlying polysubstance use disorder.  Much better today  Right middle lobe opacity, possible pneumonia-completed ceftriaxone and doxycycline  Asthma/COPD overlap-pulmonary hygiene, I-S, mobility  History of DVT-continue Eliquis, he is on this chronically  Status post right AKA, left lower extremity wound -wound care consulted  Anemia of chronic disease-intermittently refusing labs.  Hemoglobin overall stable  Homelessness-TOC consult  Scheduled Meds:  apixaban  5 mg Oral BID   ARIPiprazole  5 mg Oral Daily   budesonide (PULMICORT) nebulizer solution  0.5 mg Nebulization Q12H    Chlorhexidine Gluconate Cloth  6 each  Topical Daily   clonazePAM  0.5 mg Oral BID   docusate sodium  100 mg Oral BID   folic acid  1 mg Intravenous Daily   furosemide  40 mg Oral Daily   gabapentin  400 mg Oral TID   ipratropium-albuterol  3 mL Nebulization Q6H   multivitamin with minerals  1 tablet Oral Daily   mouth rinse  15 mL Mouth Rinse 4 times per day   pantoprazole (PROTONIX) IV  40 mg Intravenous Q12H   polyethylene glycol  17 g Oral Daily   potassium chloride  20 mEq Oral Daily   thiamine (VITAMIN B1) injection  100 mg Intravenous Daily   Continuous Infusions:  dextrose 10 mL/hr at 07/18/22 1000   feeding supplement (VITAL 1.5 CAL)     PRN Meds:.guaiFENesin, hydrALAZINE, ipratropium-albuterol, LORazepam, metoprolol tartrate, mouth rinse, oxyCODONE, prochlorperazine, traZODone, white petrolatum  Diet Orders (From admission, onward)     Start     Ordered   07/18/22 0620  Diet regular Room service appropriate? Yes; Fluid consistency: Thin  Diet effective now       Question Answer Comment  Room service appropriate? Yes   Fluid consistency: Thin      07/18/22 0620            DVT prophylaxis:  apixaban (ELIQUIS) tablet 5 mg   Lab Results  Component Value Date   PLT 267 07/16/2022      Code Status: Full Code  Family Communication: no family at bedside   Status is: Inpatient Remains inpatient appropriate because: severity of illness   Level of care: Progressive  Consultants:  Cards  PCCM  Objective: Vitals:   07/18/22 0522 07/18/22 0808 07/18/22 0843 07/18/22 1045  BP: 100/67  (!) 93/54 107/74  Pulse: 83  93 85  Resp: 20  (!) 23 (!) 24  Temp: 98.1 F (36.7 C)  99 F (37.2 C) 97.9 F (36.6 C)  TempSrc: Oral  Oral Oral  SpO2: 94% (!) 70% 98% 99%  Weight:      Height:        Intake/Output Summary (Last 24 hours) at 07/18/2022 1133 Last data filed at 07/18/2022 1000 Gross per 24 hour  Intake 512.87 ml  Output 800 ml  Net -287.13 ml   Wt  Readings from Last 3 Encounters:  07/17/22 70.9 kg  05/19/22 83.1 kg  04/05/22 83.1 kg    Examination:  Constitutional: NAD Eyes: no scleral icterus ENMT: Mucous membranes are moist.  Neck: normal, supple Respiratory: Faint bibasilar crackles, no wheezing Cardiovascular: Regular rate and rhythm, no murmurs / rubs / gallops. No LE edema.  Abdomen: non distended, no tenderness. Bowel sounds positive.  Musculoskeletal: no clubbing / cyanosis.  Skin: no rashes Neurologic: non focal   Data Reviewed: I have independently reviewed following labs and imaging studies   CBC Recent Labs  Lab 07/12/22 0750 07/13/22 0516 07/14/22 0355 07/16/22 0357  WBC 5.3 6.1 8.9 10.0  HGB 11.6* 10.9* 11.2* 11.5*  HCT 36.5* 34.4* 34.8* 36.5*  PLT 238 239 240 267  MCV 92.6 94.0 92.1 93.8  MCH 29.4 29.8 29.6 29.6  MCHC 31.8 31.7 32.2 31.5  RDW 18.3* 18.2* 18.1* 17.7*  LYMPHSABS 1.8  --   --   --   MONOABS 0.4  --   --   --   EOSABS 0.0  --   --   --   BASOSABS 0.0  --   --   --     Recent  Labs  Lab 07/12/22 0750 07/12/22 [id number] 07/13/22 0516 07/13/22 1850 07/13/22 2058 07/14/22 0355 07/14/22 0355 07/15/22 0407 07/15/22 0824 07/15/22 1706 07/16/22 0357 07/16/22 2300 07/17/22 0817 07/17/22 1314 07/17/22 1700  NA 138  --  141  --   --  141  --  143  --   --  138  --  136  --   --   K 4.0  --  4.2  --   --  3.5  --  3.7  --   --  3.9  --  3.6  --   --   CL 111  --  113*  --   --  106  --  104  --   --  104  --  101  --   --   CO2 19*  --  20*  --   --  27  --  29  --   --  27  --  28  --   --   GLUCOSE 92  --  110*  --   --  81  --  109*  --   --  151*  --  353*  --   --   BUN 13  --  17  --   --  14  --  12  --   --  14  --  11  --   --   CREATININE 1.09  --  1.13  --   --  1.16  --  1.01  --   --  0.88  --  0.72  --   --   CALCIUM 8.7*  --  8.4*  --   --  8.1*  --  8.2*  --   --  7.9*  --  8.5*  --   --   AST 24  --   --   --   --   --   --   --   --   --   --   --   --   --   --    ALT 16  --   --   --   --   --   --   --   --   --   --   --   --   --   --   ALKPHOS 116  --   --   --   --   --   --   --   --   --   --   --   --   --   --   BILITOT 1.1  --   --   --   --   --   --   --   --   --   --   --   --   --   --   ALBUMIN 3.4*  --   --   --   --   --   --   --   --   --   --   --   --   --   --   MG  --   --   --   --   --  1.4*   < > 1.9   < > 2.0 1.7 1.8  --  2.4 2.2  PROCALCITON  --   --  <0.10  --   --   --   --   --   --   --   --   --   --   --   --  LATICACIDVEN  --   --   --   --  0.9  --   --   --   --   --   --   --   --   --   --   AMMONIA  --   --   --  22  --   --   --   --   --   --   --   --   --   --   --   BNP  --  2,089.0*  --   --   --   --   --   --   --   --   --   --   --   --   --    < > = values in this interval not displayed.    ------------------------------------------------------------------------------------------------------------------ No results for input(s): "CHOL", "HDL", "LDLCALC", "TRIG", "CHOLHDL", "LDLDIRECT" in the last 72 hours.  Lab Results  Component Value Date   HGBA1C 7.0 (H) 03/24/2022   ------------------------------------------------------------------------------------------------------------------ No results for input(s): "TSH", "T4TOTAL", "T3FREE", "THYROIDAB" in the last 72 hours.  Invalid input(s): "FREET3"  Cardiac Enzymes No results for input(s): "CKMB", "TROPONINI", "MYOGLOBIN" in the last 168 hours.  Invalid input(s): "CK" ------------------------------------------------------------------------------------------------------------------    Component Value Date/Time   BNP 2,089.0 (H) 07/12/2022 0812    CBG: Recent Labs  Lab 07/17/22 1210 07/17/22 1636 07/17/22 1958 07/18/22 0520 07/18/22 0809  GLUCAP 77 158* 87 109* 100*    Recent Results (from the past 240 hour(s))  SARS Coronavirus 2 by RT PCR (hospital order, performed in [facility] hospital lab) *cepheid single result test*  Anterior Nasal Swab     Status: None   Collection Time: 07/12/22 10:49 AM   Specimen: Anterior Nasal Swab  Result Value Ref Range Status   SARS Coronavirus 2 by RT PCR NEGATIVE NEGATIVE Final    Comment: (NOTE) SARS-CoV-2 target nucleic acids are NOT DETECTED.  The SARS-CoV-2 RNA is generally detectable in upper and lower respiratory specimens during the acute phase of infection. The lowest concentration of SARS-CoV-2 viral copies this assay can detect is 250 copies / mL. A negative result does not preclude SARS-CoV-2 infection and should not be used as the sole basis for treatment or other patient management decisions.  A negative result may occur with improper specimen collection / handling, submission of specimen other than nasopharyngeal swab, presence of viral mutation(s) within the areas targeted by this assay, and inadequate number of viral copies (<250 copies / mL). A negative result must be combined with clinical observations, patient history, and epidemiological information.  Fact Sheet for Patients:   RoadLapTop.co.za  Fact Sheet for Healthcare Providers: http://kim-miller.com/  This test is not yet approved or  cleared by the [place] FDA and has been authorized for detection and/or diagnosis of SARS-CoV-2 by FDA under an Emergency Use Authorization (EUA).  This EUA will remain in effect (meaning this test can be used) for the duration of the COVID-19 declaration under Section 564(b)(1) of the Act, 21 U.S.C. section 360bbb-3(b)(1), unless the authorization is terminated or revoked sooner.  Performed at [facility], [address] [place], [name] [address]   MRSA Next Gen by PCR, Nasal     Status: None   Collection Time: 07/13/22  8:55 PM   Specimen: Nasal Mucosa; Nasal Swab  Result Value Ref Range Status   MRSA by PCR Next Gen NOT DETECTED NOT DETECTED Final    Comment: (  NOTE) The GeneXpert  MRSA Assay (FDA approved for NASAL specimens only), is one component of a comprehensive MRSA colonization surveillance program. It is not intended to diagnose MRSA infection nor to guide or monitor treatment for MRSA infections. Test performance is not FDA approved in patients less than 83 years old. Performed at [facility], [address], [place], [name] [address]   Culture, Respiratory w Gram Stain     Status: None   Collection Time: 07/13/22 11:41 PM   Specimen: Tracheal Aspirate; Respiratory  Result Value Ref Range Status   Specimen Description   Final    TRACHEAL ASPIRATE Performed at [facility], [address], [place], [name] [address]    Special Requests   Final    NONE Performed at [facility], [address] [place], [name] [address]    Gram Stain   Final    NO ORGANISMS SEEN RARE WBC PRESENT, PREDOMINANTLY PMN RARE SQUAMOUS EPITHELIAL CELLS PRESENT    Culture   Final    RARE Normal respiratory flora-no Staph aureus or Pseudomonas seen Performed at [facility], [address], [place], [name] [address]    Report Status 07/16/2022 FINAL  Final     Radiology Studies: DG Abd 1 View  Result Date: 07/17/2022 CLINICAL DATA:  Nasogastric tube placement. EXAM: ABDOMEN - 1 VIEW COMPARISON:  07/15/2022 FINDINGS: The previously seen nasogastric tube has been removed. Interval weighted feeding tube with its tip in the proximal stomach laterally on the left. Unremarkable bowel gas pattern. Bibasilar atelectasis with mild improvement on the left and mild worsening on right. Unremarkable bones thoracolumbar spine degenerative changes. IMPRESSION: Feeding tube tip in the proximal stomach. Electronically Signed   By: Beckie Salts M.D.   On: 07/17/2022 12:36     Pamella Pert, MD, PhD [facility]  Between 7 am - 7 pm I am available, please contact me via Amion (for emergencies) or Securechat (non  urgent messages)  Between 7 pm - 7 am I am not available, please contact night coverage MD/APP via Amion

## 2022-07-18 NOTE — Progress Notes (Signed)
Patient agreeable at this time for telemetry and vs but no cbg.  D10 started per order and tele placed.  Oxygen on VS 98 on RA as patient has it off currently.  Requesting a cigarette but refusing a nicotine patch.  Given snack.

## 2022-07-19 DIAGNOSIS — R9431 Abnormal electrocardiogram [ECG] [EKG]: Secondary | ICD-10-CM | POA: Diagnosis not present

## 2022-07-19 DIAGNOSIS — R079 Chest pain, unspecified: Secondary | ICD-10-CM | POA: Diagnosis not present

## 2022-07-19 DIAGNOSIS — F1910 Other psychoactive substance abuse, uncomplicated: Secondary | ICD-10-CM | POA: Diagnosis not present

## 2022-07-19 DIAGNOSIS — I5023 Acute on chronic systolic (congestive) heart failure: Secondary | ICD-10-CM | POA: Diagnosis not present

## 2022-07-19 LAB — COMPREHENSIVE METABOLIC PANEL
ALT: 12 U/L (ref 0–44)
AST: 15 U/L (ref 15–41)
Albumin: 2.5 g/dL — ABNORMAL LOW (ref 3.5–5.0)
Alkaline Phosphatase: 73 U/L (ref 38–126)
Anion gap: 7 (ref 5–15)
BUN: 12 mg/dL (ref 6–20)
CO2: 26 mmol/L (ref 22–32)
Calcium: 8.2 mg/dL — ABNORMAL LOW (ref 8.9–10.3)
Chloride: 107 mmol/L (ref 98–111)
Creatinine, Ser: 0.79 mg/dL (ref 0.61–1.24)
GFR, Estimated: >60 mL/min (ref >60)
Glucose, Bld: 164 mg/dL — ABNORMAL HIGH (ref 70–99)
Potassium: 3.7 mmol/L (ref 3.5–5.1)
Sodium: 140 mmol/L (ref 135–145)
Total Bilirubin: 0.5 mg/dL (ref 0.3–1.2)
Total Protein: 6.5 g/dL (ref 6.5–8.1)

## 2022-07-19 LAB — CBC
HCT: 33.5 % — ABNORMAL LOW (ref 39.0–52.0)
Hemoglobin: 10.5 g/dL — ABNORMAL LOW (ref 13.0–17.0)
MCH: 29.2 pg (ref 26.0–34.0)
MCHC: 31.3 g/dL (ref 30.0–36.0)
MCV: 93.3 fL (ref 80.0–100.0)
Platelets: 302 10*3/uL (ref 150–400)
RBC: 3.59 MIL/uL — ABNORMAL LOW (ref 4.22–5.81)
RDW: 17.3 % — ABNORMAL HIGH (ref 11.5–15.5)
WBC: 7.1 10*3/uL (ref 4.0–10.5)
nRBC: 0 % (ref 0.0–0.2)

## 2022-07-19 LAB — GLUCOSE, CAPILLARY: Glucose-Capillary: 135 mg/dL — ABNORMAL HIGH (ref 70–99)

## 2022-07-19 LAB — MAGNESIUM: Magnesium: 2 mg/dL (ref 1.7–2.4)

## 2022-07-19 MED ORDER — ALTEPLASE 2 MG IJ SOLR
2.0000 mg | Freq: Once | INTRAMUSCULAR | Status: AC
  Administered 2022-07-19: 2 mg
  Filled 2022-07-19: qty 2

## 2022-07-19 NOTE — Progress Notes (Signed)
Pt refused CBG.

## 2022-07-19 NOTE — Plan of Care (Signed)
  Problem: Coping: Goal: Level of anxiety will decrease Outcome: Progressing   Problem: Elimination: Goal: Will not experience complications related to urinary retention Outcome: Progressing   Problem: Pain Managment: Goal: General experience of comfort will improve Outcome: Progressing   Problem: Safety: Goal: Ability to remain free from injury will improve Outcome: Progressing   

## 2022-07-19 NOTE — Progress Notes (Addendum)
   07/19/22 1200  Provider Notification  Provider Name/Title Dr. Elvera Lennox  Date Provider Notified 07/19/22  Time Provider Notified 1203  Method of Notification Page  Notification Reason Red med refusal  Provider response No new orders  Date of Provider Response 07/19/22  Time of Provider Response 1205   Pt refused most morning medications, except for controlled substances. Made MD aware. Pt also refused 12 lead EKG, CHG bath and CBG.

## 2022-07-19 NOTE — Progress Notes (Signed)
Progress Note  Patient Name: [name] Date of Encounter: 07/19/2022  [facility] Cardiologist: Rollene Rotunda, MD   Subjective   Continues to intermittently refuse care.  Creatinine stable at 0.79.  BP 131/82.  Remains on 4 L [place], SPO2 100%.  Reports persistent dyspnea and right sided chest pain  Inpatient Medications    Scheduled Meds:  apixaban  5 mg Oral BID   ARIPiprazole  5 mg Oral Daily   budesonide (PULMICORT) nebulizer solution  0.5 mg Nebulization Q12H   Chlorhexidine Gluconate Cloth  6 each Topical Daily   clonazePAM  0.5 mg Oral BID   docusate sodium  100 mg Oral BID   folic acid  1 mg Intravenous Daily   furosemide  40 mg Oral Daily   gabapentin  400 mg Oral TID   ipratropium-albuterol  3 mL Nebulization Q6H   multivitamin with minerals  1 tablet Oral Daily   mouth rinse  15 mL Mouth Rinse 4 times per day   pantoprazole (PROTONIX) IV  40 mg Intravenous Q12H   polyethylene glycol  17 g Oral Daily   potassium chloride  20 mEq Oral Daily   thiamine (VITAMIN B1) injection  100 mg Intravenous Daily   Continuous Infusions:  dextrose 10 mL/hr at 07/18/22 1000   feeding supplement (VITAL 1.5 CAL)     PRN Meds: guaiFENesin, hydrALAZINE, ipratropium-albuterol, LORazepam, metoprolol tartrate, mouth rinse, oxyCODONE, prochlorperazine, sodium chloride flush, traZODone, white petrolatum   Vital Signs    Vitals:   07/18/22 2035 07/18/22 2200 07/19/22 0500 07/19/22 0738  BP:  131/82    Pulse:  97    Resp:      Temp:  98.2 F (36.8 C)    TempSrc:  Oral    SpO2: 97% 98%  100%  Weight:   69.9 kg   Height:        Intake/Output Summary (Last 24 hours) at 07/19/2022 0914 Last data filed at 07/19/2022 0600 Gross per 24 hour  Intake 894.86 ml  Output 750 ml  Net 144.86 ml       07/19/2022    5:00 AM 07/17/2022    5:00 AM 07/16/2022    4:02 AM  Last 3 Weights  Weight (lbs) 154 lb 1.6 oz 156 lb 4.9 oz 165 lb 9.1 oz  Weight (kg) 69.9 kg 70.9 kg 75.1 kg       Telemetry    NSR - Personally Reviewed  ECG    No new ECG tracing today. - Personally Reviewed  Physical Exam   GEN: chronically ill appearing Neck: + JVD. Cardiac: RRR. No murmurs, rubs, or gallops.  Respiratory: Clear to auscultation bilaterally. No wheezes, rhonchi, or rales. GI: Soft, non-distended, and non-tender. MS: No lower extremity edema. S/p right above knee amputation. Skin: Warm and dry. Neuro:  No focal deficits. Psych: Normal affect. Responds appropriately.  Labs    High Sensitivity Troponin:   Recent Labs  Lab 07/13/22 1850 07/13/22 2213 07/18/22 1248  TROPONINIHS 45* 38* 18*      Chemistry Recent Labs  Lab 07/17/22 0817 07/17/22 1314 07/18/22 1248 07/18/22 2102 07/19/22 0522  NA 136  --  137  --  140  K 3.6  --  3.6  --  3.7  CL 101  --  106  --  107  CO2 28  --  27  --  26  GLUCOSE 353*  --  118*  --  164*  BUN 11  --  12  --  12  CREATININE 0.72  --  0.79  --  0.79  CALCIUM 8.5*  --  8.0*  --  8.2*  MG  --    < > 1.9 2.1 2.0  PROT  --   --   --   --  6.5  ALBUMIN  --   --   --   --  2.5*  AST  --   --   --   --  15  ALT  --   --   --   --  12  ALKPHOS  --   --   --   --  73  BILITOT  --   --   --   --  0.5  GFRNONAA >60  --  >60  --  >60  ANIONGAP 7  --  4*  --  7   < > = values in this interval not displayed.     Lipids  Recent Labs  Lab 07/14/22 0355  TRIG 52     Hematology Recent Labs  Lab 07/16/22 0357 07/18/22 1248 07/19/22 0522  WBC 10.0 7.4 7.1  RBC 3.89* 3.71* 3.59*  HGB 11.5* 10.9* 10.5*  HCT 36.5* 34.0* 33.5*  MCV 93.8 91.6 93.3  MCH 29.6 29.4 29.2  MCHC 31.5 32.1 31.3  RDW 17.7* 17.2* 17.3*  PLT 267 293 302    Thyroid No results for input(s): "TSH", "FREET4" in the last 168 hours.  BNP No results for input(s): "BNP", "PROBNP" in the last 168 hours.   DDimer No results for input(s): "DDIMER" in the last 168 hours.   Radiology    DG Abd 1 View  Result Date: 07/17/2022 CLINICAL DATA:   Nasogastric tube placement. EXAM: ABDOMEN - 1 VIEW COMPARISON:  07/15/2022 FINDINGS: The previously seen nasogastric tube has been removed. Interval weighted feeding tube with its tip in the proximal stomach laterally on the left. Unremarkable bowel gas pattern. Bibasilar atelectasis with mild improvement on the left and mild worsening on right. Unremarkable bones thoracolumbar spine degenerative changes. IMPRESSION: Feeding tube tip in the proximal stomach. Electronically Signed   By: Beckie Salts M.D.   On: 07/17/2022 12:36    Cardiac Studies   Echocardiogram 07/14/2022: Impressions:  1. Left ventricular ejection fraction, by estimation, is <20%. Left  ventricular ejection fraction by 3D volume is 20 %. The left ventricle has  severely decreased function. The left ventricle demonstrates global  hypokinesis. The left ventricular internal   cavity size was moderately dilated. There is mild left ventricular  hypertrophy. Left ventricular diastolic parameters are consistent with  Grade II diastolic dysfunction (pseudonormalization). Elevated left  ventricular end-diastolic pressure. The E/e' is   24.   2. Right ventricular systolic function is moderately reduced. The right  ventricular size is mildly enlarged. There is normal pulmonary artery  systolic pressure. The estimated right ventricular systolic pressure is  26.7 mmHg.   3. The mitral valve is abnormal. Trivial mitral valve regurgitation.   4. The aortic valve is tricuspid. Aortic valve regurgitation is not  visualized.   5. The inferior vena cava is normal in size with <50% respiratory  variability, suggesting right atrial pressure of 8 mmHg.   Comparison(s): No significant change from prior study. 01/30/2022: LVEF  <20%, grade 3 DD, elevated LV filling pressure.   Patient Profile     56 y.o. 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 He has had multiple admission over the last 8 months for a variety of complaints including acute on chronic CHF. He was admitted on 07/12/2022 for acute hypoxic respiratory failure secondary to acute on chronic CHF, COPD exacerbation, and pneumonia after presenting with shortness of breath. He reported being non-compliant with his home Lasix. He ultimately required intubation on 07/13/2022 due to rising O2 requirement and refusing BiPAP due to agitation. He was able to be extubated on 07/16/2022.  Assessment & Plan    Acute on Chronic Combined CHF BNP elevated at 2,089. Chest x-ray showed findings concerning for pneumonia but no overt edema. Echo showed LVEF of <20% with global hypokinesis, grade 2 diastolic dysfunction, and elevated LVEDP. RV mildly enlarged with moderately reduced RV systolic function and normal PASP. He has been diuresed with IV Lasix. Net negative 4.16 L this admission. Weight 156 lbs today, down 9 lbs from yesterday if accurate. Today's labs pending. CO-OX yesterday 77 - no evidence of cardiogenic shock. -Continue Lasix 40 mg daily - He was only on Coreg at home as far as GDMT goes. Can try to restart oral medications although I suspect non-compliance is going to continue to be an issue. BP is soft at time with systolic BP in the low 0000000 so will hold on adding GDMT right now. - Continue to monitor daily weights, strict I/Os, and renal function. - He has a long history of non-compliance, on-going cocaine abuse, and being loss to follow-up and is not a candidate for advanced therapies. This is an unfortunate situation. He is still homeless and living on the "streets." I fear compliance is going to be an ongoing issues and he is  going to be at high risk of decompensation/readmission.    CAD Prior cath at [facility] in 2020 showed moderate non-obstructive CAD. Coronary CTA in 03/2020 showed moderate to severe disease of RCA, LAD, and LCX. However, not felt to be candidate for invasive ischemic work-up due to noncompliance and cocaine abuse. High-sensitivity troponin minimally elevated and flat at 45 >> 38 consistent with demand ischemia and prior trends.  - He reports intermittent sharp chest pain that last for a couple of minutes and then resolves. This is a long standing issue. He was having this during his admission in 03/2022 as well. EKG, troponin unremarkable - No aspirin due to need for Eliquis. - Not on a statin here or at home. Given significant non-compliance, I don't think there is much utility in trying to restart this. I think we should focus more on diuretic and GDMT that will help keep fluid off and keep him out of the hospital. - Not a candidate for invasive ischemic evaluation given significant non-compliance and ongoing cocaine abuse.   Sinus Bradycardia - Resolved Secondary to Precedex drip which has now been stopped. Heart rate have now improved are in the 80s to low 100s. - Continue to monitor.   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 - On Eliquis 5mg  twice daily. This was initially held due to coffee ground drainage from OT but has now been resumed.   Hypertension BP soft at times but stable.  - Will hold off on adding GDMT right now given systolic BP in the low 0000000.   Prolonged QTc QTc  520 ms on initial EKG and then was 602 ms on EKG 8/17. This was felt secondary to the Precedex drip which was stopped.   Improved to 528 on EKG 8/18, will monitor   Otherwise, per primary team: - Acute hypoxic respiratory failure  - COPD/asthma - Acute toxic and metabolic encephalopathy  - Polysubstance abuse with substance and ETOH withdrawal - Anemia of chronic disease - S/p right above  knee amputation   For questions or updates, please contact [facility] Please consult [email or web address] for contact info under        Signed, Donato Heinz, MD  07/19/2022, 9:14 AM

## 2022-07-19 NOTE — Progress Notes (Signed)
PROGRESS NOTE  Amani Nodarse TFT:[id number] DOB: Oct 19, 1966 DOA: 07/12/2022 PCP: [name], NP   LOS: 6 days   Brief Narrative / Interim history: 56yM with history of asthma, bipolar 1 disorder, depression, schizoaffective disorder, chronic congestive heart failure, homelessness, GERD, hypertension, QT prolongation, type 2 diabetes, alcohol abuse, cocaine and cannabis abuse, tobacco use, chronic combined systolic and diastolic heart failure, right AKA who presented to ED with gradually worsening dyspnea, LE edema, chest pressure and productive cough.  Hospital course complicated by ventilator dependent respiratory failure, eventually being extubated on 8/16.  Hospital course also complicated by persistent agitation requiring Precedex drip, causing bradycardia and eventually discontinued.  He was transferred to the hospitalist service on 8/18  Significant Hospital Events:  8/12 admitted started on ceftriaxone, doxy, diuresed 8/13 increasing O2 requirement to 8L, refusing bipap, agitation requiring a couple CIWA based doses of ativan 8/14 Some issues with agitation overnight, currently on Fentanyl and Versed drips 8/15 Issues with hypoglycemia and mild hypotension overnight, resolved with IV hydration 8/16 No issues overnight, now off versed drip. Extubated  8/18 Transfer to hospitalist service  Subjective / 24h Interval events: Complains of left leg pain, intermittent chest pains  Assesement and Plan: Principal Problem:   Acute on chronic systolic congestive heart failure (HCC) Active Problems:   COPD with acute exacerbation (HCC)   Major depressive disorder, recurrent episode (HCC)   Alcohol use disorder, mild, abuse   Homelessness   Schizoaffective disorder, bipolar type (HCC)   Chest pain of uncertain etiology   Acute hypoxemic respiratory failure (HCC)   Hyperlipidemia   Polysubstance abuse (HCC)   Prolonged QT interval   History of DVT (deep vein thrombosis)   CAP  (community acquired pneumonia)   Pressure injury of skin   Acute on chronic systolic (congestive) heart failure (HCC)  Principal problem Acute hypoxic respiratory failure due to acute on chronic combined CHF, possible asthma/COPD overlap-PCCM initially consulted, he was admitted to the ICU and had to be intubated.  Eventually extubated 8/16.  He also has completed antibiotics with ceftriaxone and doxycycline, received 5 days with end date 8/16.  Continue furosemide, likely there is a component of fluid overload.  Active problems Acute on chronic combined CHF-2D echo showed LVEF less than 20% with global hypokinesis, grade 2 diastolic dysfunction.  Cardiology consulted and following.  Has been initially diuresed, but now converted to p.o. Lasix.  Noncompliance has been the main issue, and also he is homeless and continues to abuse cocaine.  Not a candidate for aggressive advanced interventions due to polysubstance use and noncompliance.  Coronary artery disease-most recent cath at [facility] in 2020 showed moderate nonobstructive CAD, coronary CTA in 2021 showed moderate to severe RCA disease, LAD and LCx.  Felt not to be a candidate for invasive ischemic work-up due to noncompliance and cocaine abuse.  Troponins are flat, likely representing demand ischemia  Sinus bradycardia-due to Precedex, now discontinued.  Sinus bradycardia resolved  Acute metabolic encephalopathy-likely related to ICU delirium, underlying polysubstance use disorder.  Rule soft, appears back to baseline  Right middle lobe opacity, possible pneumonia-completed ceftriaxone and doxycycline  Asthma/COPD overlap-pulmonary hygiene, I-S, mobility  History of DVT-continue Eliquis, he is on this chronically  Status post right AKA, left lower extremity wound -wound care consulted  Anemia of chronic disease-intermittently refusing labs.  Hemoglobin overall stable  Homelessness-TOC consult  Scheduled Meds:  apixaban  5 mg Oral BID    ARIPiprazole  5 mg Oral Daily   budesonide (PULMICORT) nebulizer  solution  0.5 mg Nebulization Q12H   Chlorhexidine Gluconate Cloth  6 each Topical Daily   clonazePAM  0.5 mg Oral BID   docusate sodium  100 mg Oral BID   folic acid  1 mg Intravenous Daily   furosemide  40 mg Oral Daily   gabapentin  400 mg Oral TID   ipratropium-albuterol  3 mL Nebulization Q6H   multivitamin with minerals  1 tablet Oral Daily   mouth rinse  15 mL Mouth Rinse 4 times per day   pantoprazole (PROTONIX) IV  40 mg Intravenous Q12H   polyethylene glycol  17 g Oral Daily   potassium chloride  20 mEq Oral Daily   thiamine (VITAMIN B1) injection  100 mg Intravenous Daily   Continuous Infusions:  dextrose 10 mL/hr at 07/18/22 1000   feeding supplement (VITAL 1.5 CAL)     PRN Meds:.guaiFENesin, hydrALAZINE, ipratropium-albuterol, LORazepam, metoprolol tartrate, mouth rinse, oxyCODONE, prochlorperazine, sodium chloride flush, traZODone, white petrolatum  Diet Orders (From admission, onward)     Start     Ordered   07/18/22 0620  Diet regular Room service appropriate? Yes; Fluid consistency: Thin  Diet effective now       Question Answer Comment  Room service appropriate? Yes   Fluid consistency: Thin      07/18/22 0620            DVT prophylaxis:  apixaban (ELIQUIS) tablet 5 mg   Lab Results  Component Value Date   PLT 302 07/19/2022      Code Status: Full Code  Family Communication: no family at bedside   Status is: Inpatient Remains inpatient appropriate because: severity of illness   Level of care: Progressive  Consultants:  Cards  PCCM  Objective: Vitals:   07/18/22 2035 07/18/22 2200 07/19/22 0500 07/19/22 0738  BP:  131/82    Pulse:  97    Resp:      Temp:  98.2 F (36.8 C)    TempSrc:  Oral    SpO2: 97% 98%  100%  Weight:   69.9 kg   Height:        Intake/Output Summary (Last 24 hours) at 07/19/2022 1109 Last data filed at 07/19/2022 1000 Gross per 24 hour   Intake 839.23 ml  Output 750 ml  Net 89.23 ml    Wt Readings from Last 3 Encounters:  07/19/22 69.9 kg  05/19/22 83.1 kg  04/05/22 83.1 kg    Examination:  Constitutional: NAD Eyes: lids and conjunctivae normal, no scleral icterus ENMT: mmm Neck: normal, supple Respiratory: Faint bibasilar crackles, no wheezing.  Appears tachypneic at times Cardiovascular: Regular rate and rhythm, no murmurs / rubs / gallops. No LE edema. Abdomen: soft, no distention, no tenderness. Bowel sounds positive.  Skin: no rashes Neurologic: no focal deficits, equal strength   Data Reviewed: I have independently reviewed following labs and imaging studies   CBC Recent Labs  Lab 07/13/22 0516 07/14/22 0355 07/16/22 0357 07/18/22 1248 07/19/22 0522  WBC 6.1 8.9 10.0 7.4 7.1  HGB 10.9* 11.2* 11.5* 10.9* 10.5*  HCT 34.4* 34.8* 36.5* 34.0* 33.5*  PLT 239 240 267 293 302  MCV 94.0 92.1 93.8 91.6 93.3  MCH 29.8 29.6 29.6 29.4 29.2  MCHC 31.7 32.2 31.5 32.1 31.3  RDW 18.2* 18.1* 17.7* 17.2* 17.3*     Recent Labs  Lab 07/13/22 0516 07/13/22 1850 07/13/22 2058 07/14/22 0355 07/15/22 0407 07/15/22 0824 07/16/22 0357 07/16/22 2300 07/17/22 0817 07/17/22 1314 07/17/22 1700 07/18/22  1248 07/18/22 2102 07/19/22 0522  NA 141  --   --    < > 143  --  138  --  136  --   --  137  --  140  K 4.2  --   --    < > 3.7  --  3.9  --  3.6  --   --  3.6  --  3.7  CL 113*  --   --    < > 104  --  104  --  101  --   --  106  --  107  CO2 20*  --   --    < > 29  --  27  --  28  --   --  27  --  26  GLUCOSE 110*  --   --    < > 109*  --  151*  --  353*  --   --  118*  --  164*  BUN 17  --   --    < > 12  --  14  --  11  --   --  12  --  12  CREATININE 1.13  --   --    < > 1.01  --  0.88  --  0.72  --   --  0.79  --  0.79  CALCIUM 8.4*  --   --    < > 8.2*  --  7.9*  --  8.5*  --   --  8.0*  --  8.2*  AST  --   --   --   --   --   --   --   --   --   --   --   --   --  15  ALT  --   --   --   --   --    --   --   --   --   --   --   --   --  12  ALKPHOS  --   --   --   --   --   --   --   --   --   --   --   --   --  73  BILITOT  --   --   --   --   --   --   --   --   --   --   --   --   --  0.5  ALBUMIN  --   --   --   --   --   --   --   --   --   --   --   --   --  2.5*  MG  --   --   --    < > 1.9   < > 1.7   < >  --  2.4 2.2 1.9 2.1 2.0  PROCALCITON <0.10  --   --   --   --   --   --   --   --   --   --   --   --   --   LATICACIDVEN  --   --  0.9  --   --   --   --   --   --   --   --   --   --   --   AMMONIA  --  22  --   --   --   --   --   --   --   --   --   --   --   --    < > = values in this interval not displayed.     ------------------------------------------------------------------------------------------------------------------ No results for input(s): "CHOL", "HDL", "LDLCALC", "TRIG", "CHOLHDL", "LDLDIRECT" in the last 72 hours.  Lab Results  Component Value Date   HGBA1C 7.0 (H) 03/24/2022   ------------------------------------------------------------------------------------------------------------------ No results for input(s): "TSH", "T4TOTAL", "T3FREE", "THYROIDAB" in the last 72 hours.  Invalid input(s): "FREET3"  Cardiac Enzymes No results for input(s): "CKMB", "TROPONINI", "MYOGLOBIN" in the last 168 hours.  Invalid input(s): "CK" ------------------------------------------------------------------------------------------------------------------    Component Value Date/Time   BNP 2,089.0 (H) 07/12/2022 0812    CBG: Recent Labs  Lab 07/18/22 0809 07/18/22 1206 07/18/22 1623 07/18/22 1959 07/18/22 2358  GLUCAP 100* 151* 110* 118* 114*     Recent Results (from the past 240 hour(s))  SARS Coronavirus 2 by RT PCR (hospital order, performed in [facility] hospital lab) *cepheid single result test* Anterior Nasal Swab     Status: None   Collection Time: 07/12/22 10:49 AM   Specimen: Anterior Nasal Swab  Result Value Ref Range Status   SARS  Coronavirus 2 by RT PCR NEGATIVE NEGATIVE Final    Comment: (NOTE) SARS-CoV-2 target nucleic acids are NOT DETECTED.  The SARS-CoV-2 RNA is generally detectable in upper and lower respiratory specimens during the acute phase of infection. The lowest concentration of SARS-CoV-2 viral copies this assay can detect is 250 copies / mL. A negative result does not preclude SARS-CoV-2 infection and should not be used as the sole basis for treatment or other patient management decisions.  A negative result may occur with improper specimen collection / handling, submission of specimen other than nasopharyngeal swab, presence of viral mutation(s) within the areas targeted by this assay, and inadequate number of viral copies (<250 copies / mL). A negative result must be combined with clinical observations, patient history, and epidemiological information.  Fact Sheet for Patients:   https://www.patel.info/  Fact Sheet for Healthcare Providers: https://hall.com/  This test is not yet approved or  cleared by the [place] FDA and has been authorized for detection and/or diagnosis of SARS-CoV-2 by FDA under an Emergency Use Authorization (EUA).  This EUA will remain in effect (meaning this test can be used) for the duration of the COVID-19 declaration under Section 564(b)(1) of the Act, 21 U.S.C. section 360bbb-3(b)(1), unless the authorization is terminated or revoked sooner.  Performed at [facility], [place] [address] [place], [place] [address]   MRSA Next Gen by PCR, Nasal     Status: None   Collection Time: 07/13/22  8:55 PM   Specimen: Nasal Mucosa; Nasal Swab  Result Value Ref Range Status   MRSA by PCR Next Gen NOT DETECTED NOT DETECTED Final    Comment: (NOTE) The GeneXpert MRSA Assay (FDA approved for NASAL specimens only), is one component of a comprehensive MRSA colonization surveillance program. It is not intended  to diagnose MRSA infection nor to guide or monitor treatment for MRSA infections. Test performance is not FDA approved in patients less than 28 years old. Performed at [facility], [place] [address] [place], [place] [address]   Culture, Respiratory w Gram Stain     Status: None   Collection Time: 07/13/22 11:41 PM   Specimen: Tracheal Aspirate; Respiratory  Result Value Ref  Range Status   Specimen Description   Final    TRACHEAL ASPIRATE Performed at [facility] [address] [place], [place] [address]    Special Requests   Final    NONE Performed at [facility], [place] [address], [place], [place] [address]    Gram Stain   Final    NO ORGANISMS SEEN RARE WBC PRESENT, PREDOMINANTLY PMN RARE SQUAMOUS EPITHELIAL CELLS PRESENT    Culture   Final    RARE Normal respiratory flora-no Staph aureus or Pseudomonas seen Performed at [facility] [address], [place], [place] [address]    Report Status 07/16/2022 FINAL  Final     Radiology Studies: No results found.   Marzetta Board, MD, PhD [facility]  Between 7 am - 7 pm I am available, please contact me via Amion (for emergencies) or Securechat (non urgent messages)  Between 7 pm - 7 am I am not available, please contact night coverage MD/APP via Amion

## 2022-07-19 NOTE — Progress Notes (Signed)
Pt refused vital signs and dressing change at this time. Educated pt, no change in decision.

## 2022-07-19 NOTE — Progress Notes (Signed)
Pt refused 0400 POC CBG. Pt educated, still refused. Pt said "the last finger stick was my last".

## 2022-07-20 DIAGNOSIS — I5023 Acute on chronic systolic (congestive) heart failure: Secondary | ICD-10-CM | POA: Diagnosis not present

## 2022-07-20 DIAGNOSIS — R9431 Abnormal electrocardiogram [ECG] [EKG]: Secondary | ICD-10-CM | POA: Diagnosis not present

## 2022-07-20 DIAGNOSIS — J9601 Acute respiratory failure with hypoxia: Secondary | ICD-10-CM | POA: Diagnosis not present

## 2022-07-20 DIAGNOSIS — R079 Chest pain, unspecified: Secondary | ICD-10-CM | POA: Diagnosis not present

## 2022-07-20 DIAGNOSIS — I5043 Acute on chronic combined systolic (congestive) and diastolic (congestive) heart failure: Secondary | ICD-10-CM | POA: Diagnosis not present

## 2022-07-20 DIAGNOSIS — F1910 Other psychoactive substance abuse, uncomplicated: Secondary | ICD-10-CM | POA: Diagnosis not present

## 2022-07-20 LAB — BASIC METABOLIC PANEL
Anion gap: 5 (ref 5–15)
BUN: 11 mg/dL (ref 6–20)
CO2: 28 mmol/L (ref 22–32)
Calcium: 8.2 mg/dL — ABNORMAL LOW (ref 8.9–10.3)
Chloride: 106 mmol/L (ref 98–111)
Creatinine, Ser: 0.78 mg/dL (ref 0.61–1.24)
GFR, Estimated: >60 mL/min (ref >60)
Glucose, Bld: 95 mg/dL (ref 70–99)
Potassium: 3.6 mmol/L (ref 3.5–5.1)
Sodium: 139 mmol/L (ref 135–145)

## 2022-07-20 LAB — GLUCOSE, CAPILLARY
Glucose-Capillary: 249 mg/dL — ABNORMAL HIGH (ref 70–99)
Glucose-Capillary: 214 mg/dL — ABNORMAL HIGH (ref 70–99)
Glucose-Capillary: 108 mg/dL — ABNORMAL HIGH (ref 70–99)
Glucose-Capillary: 281 mg/dL — ABNORMAL HIGH (ref 70–99)

## 2022-07-20 LAB — MAGNESIUM: Magnesium: 1.8 mg/dL (ref 1.7–2.4)

## 2022-07-20 MED ORDER — POTASSIUM CHLORIDE CRYS ER 20 MEQ PO TBCR
40.0000 meq | EXTENDED_RELEASE_TABLET | Freq: Every day | ORAL | Status: DC
  Administered 2022-07-21 – 2022-07-22 (×2): 40 meq via ORAL
  Filled 2022-07-20 (×5): qty 2

## 2022-07-20 MED ORDER — FUROSEMIDE 10 MG/ML IJ SOLN
40.0000 mg | Freq: Two times a day (BID) | INTRAMUSCULAR | Status: DC
  Administered 2022-07-20 – 2022-07-22 (×3): 40 mg via INTRAVENOUS
  Filled 2022-07-20 (×4): qty 4

## 2022-07-20 MED ORDER — IPRATROPIUM-ALBUTEROL 0.5-2.5 (3) MG/3ML IN SOLN
3.0000 mL | Freq: Two times a day (BID) | RESPIRATORY_TRACT | Status: DC
  Administered 2022-07-20 – 2022-07-25 (×9): 3 mL via RESPIRATORY_TRACT
  Filled 2022-07-20 (×10): qty 3

## 2022-07-20 MED ORDER — OXYCODONE HCL 5 MG PO TABS
10.0000 mg | ORAL_TABLET | Freq: Four times a day (QID) | ORAL | Status: DC | PRN
  Administered 2022-07-20 (×2): 10 mg via ORAL
  Administered 2022-07-21 (×2): 15 mg via ORAL
  Administered 2022-07-21: 10 mg via ORAL
  Administered 2022-07-22 – 2022-07-25 (×11): 15 mg via ORAL
  Filled 2022-07-20 (×5): qty 3
  Filled 2022-07-20 (×3): qty 2
  Filled 2022-07-20 (×8): qty 3

## 2022-07-20 MED ORDER — MAGNESIUM OXIDE -MG SUPPLEMENT 400 (240 MG) MG PO TABS
400.0000 mg | ORAL_TABLET | Freq: Every day | ORAL | Status: DC
  Administered 2022-07-21 – 2022-07-22 (×2): 400 mg via ORAL
  Filled 2022-07-20 (×5): qty 1

## 2022-07-20 NOTE — Plan of Care (Signed)
  Problem: Clinical Measurements: Goal: Ability to maintain clinical measurements within normal limits will improve Outcome: Progressing Goal: Diagnostic test results will improve Outcome: Progressing Goal: Respiratory complications will improve Outcome: Progressing   Problem: Safety: Goal: Ability to remain free from injury will improve Outcome: Progressing   

## 2022-07-20 NOTE — Progress Notes (Signed)
Progress Note  Patient Name: [name] Date of Encounter: 07/20/2022  [facility] Cardiologist: Minus Breeding, MD   Subjective   Continues to intermittently refuse care.  Creatinine stable at 0.78.  BP 145/87.  Remains on 4 L [place], SPO2 100%.  Reports persistent dyspnea   Inpatient Medications    Scheduled Meds:  apixaban  5 mg Oral BID   ARIPiprazole  5 mg Oral Daily   budesonide (PULMICORT) nebulizer solution  0.5 mg Nebulization Q12H   Chlorhexidine Gluconate Cloth  6 each Topical Daily   clonazePAM  0.5 mg Oral BID   docusate sodium  100 mg Oral BID   folic acid  1 mg Intravenous Daily   furosemide  40 mg Oral Daily   gabapentin  400 mg Oral TID   ipratropium-albuterol  3 mL Nebulization Q6H   multivitamin with minerals  1 tablet Oral Daily   mouth rinse  15 mL Mouth Rinse 4 times per day   pantoprazole (PROTONIX) IV  40 mg Intravenous Q12H   polyethylene glycol  17 g Oral Daily   potassium chloride  20 mEq Oral Daily   thiamine (VITAMIN B1) injection  100 mg Intravenous Daily   Continuous Infusions:  dextrose 10 mL/hr at 07/18/22 1000   feeding supplement (VITAL 1.5 CAL)     PRN Meds: guaiFENesin, hydrALAZINE, ipratropium-albuterol, LORazepam, metoprolol tartrate, mouth rinse, oxyCODONE, prochlorperazine, sodium chloride flush, traZODone, white petrolatum   Vital Signs    Vitals:   07/19/22 1326 07/19/22 1352 07/19/22 2013 07/20/22 0500  BP:  129/73 (!) 145/87   Pulse:  92 99   Resp:  17 18   Temp:  98.2 F (36.8 C) 98.3 F (36.8 C)   TempSrc:  Oral Oral   SpO2: 98% 100% 100%   Weight:    70 kg  Height:        Intake/Output Summary (Last 24 hours) at 07/20/2022 0851 Last data filed at 07/20/2022 0847 Gross per 24 hour  Intake 1435.66 ml  Output 950 ml  Net 485.66 ml       07/20/2022    5:00 AM 07/19/2022    5:00 AM 07/17/2022    5:00 AM  Last 3 Weights  Weight (lbs) 154 lb 5.2 oz 154 lb 1.6 oz 156 lb 4.9 oz  Weight (kg) 70 kg 69.9 kg 70.9  kg      Telemetry    NSR - Personally Reviewed  ECG    No new ECG tracing today. - Personally Reviewed  Physical Exam   GEN: chronically ill appearing Neck: + JVD. Cardiac: RRR. No murmurs, rubs, or gallops.  Respiratory: Expiratory wheezing GI: Soft, non-distended, and non-tender. MS: No lower extremity edema. S/p right above knee amputation. Skin: Warm and dry. Neuro:  No focal deficits. Psych: Normal affect.   Labs    High Sensitivity Troponin:   Recent Labs  Lab 07/13/22 1850 07/13/22 2213 07/18/22 1248  TROPONINIHS 45* 38* 18*      Chemistry Recent Labs  Lab 07/18/22 1248 07/18/22 2102 07/19/22 0522 07/20/22 0529  NA 137  --  140 139  K 3.6  --  3.7 3.6  CL 106  --  107 106  CO2 27  --  26 28  GLUCOSE 118*  --  164* 95  BUN 12  --  12 11  CREATININE 0.79  --  0.79 0.78  CALCIUM 8.0*  --  8.2* 8.2*  MG 1.9 2.1 2.0 1.8  PROT  --   --  6.5  --   ALBUMIN  --   --  2.5*  --   AST  --   --  15  --   ALT  --   --  12  --   ALKPHOS  --   --  73  --   BILITOT  --   --  0.5  --   GFRNONAA >60  --  >60 >60  ANIONGAP 4*  --  7 5     Lipids  Recent Labs  Lab 07/14/22 0355  TRIG 52     Hematology Recent Labs  Lab 07/16/22 0357 07/18/22 1248 07/19/22 0522  WBC 10.0 7.4 7.1  RBC 3.89* 3.71* 3.59*  HGB 11.5* 10.9* 10.5*  HCT 36.5* 34.0* 33.5*  MCV 93.8 91.6 93.3  MCH 29.6 29.4 29.2  MCHC 31.5 32.1 31.3  RDW 17.7* 17.2* 17.3*  PLT 267 293 302    Thyroid No results for input(s): "TSH", "FREET4" in the last 168 hours.  BNP No results for input(s): "BNP", "PROBNP" in the last 168 hours.   DDimer No results for input(s): "DDIMER" in the last 168 hours.   Radiology    No results found.  Cardiac Studies   Echocardiogram 07/14/2022: Impressions:  1. Left ventricular ejection fraction, by estimation, is <20%. Left  ventricular ejection fraction by 3D volume is 20 %. The left ventricle has  severely decreased function. The left ventricle  demonstrates global  hypokinesis. The left ventricular internal   cavity size was moderately dilated. There is mild left ventricular  hypertrophy. Left ventricular diastolic parameters are consistent with  Grade II diastolic dysfunction (pseudonormalization). Elevated left  ventricular end-diastolic pressure. The E/e' is   24.   2. Right ventricular systolic function is moderately reduced. The right  ventricular size is mildly enlarged. There is normal pulmonary artery  systolic pressure. The estimated right ventricular systolic pressure is  26.7 mmHg.   3. The mitral valve is abnormal. Trivial mitral valve regurgitation.   4. The aortic valve is tricuspid. Aortic valve regurgitation is not  visualized.   5. The inferior vena cava is normal in size with <50% respiratory  variability, suggesting right atrial pressure of 8 mmHg.   Comparison(s): No significant change from prior study. 01/30/2022: LVEF  <20%, grade 3 DD, elevated LV filling pressure.   Patient Profile     56 y.o. 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 He has had multiple admission over the last 8 months for a variety of complaints including acute on chronic CHF. He was admitted on 07/12/2022 for acute hypoxic respiratory failure secondary to acute on chronic CHF, COPD exacerbation, and pneumonia after presenting with shortness of breath. He reported being non-compliant with his home Lasix. He ultimately required intubation on 07/13/2022 due to rising O2 requirement and refusing BiPAP due to agitation. He was able to be extubated on 07/16/2022.  Assessment & Plan    Acute on  Chronic Combined CHF: BNP elevated at 2,089. Chest x-ray showed findings concerning for pneumonia but no overt edema. Echo showed LVEF of <20% with global hypokinesis, grade 2 diastolic dysfunction, and elevated LVEDP. RV mildly enlarged with moderately reduced RV systolic function  and normal PASP.  Required intubation on admission, now extubated and on 4 L [place].  He has been diuresed with IV Lasix. Net negative 4.2 L this admission. -Converted to p.o. Lasix but he appears more tachypneic and JVD elevated, will switch back to IV Lasix.  He continues to refuse some medications which complicates his care - Maintain K greater than 4, mag greater than 2 - He was only on Coreg at home as far as GDMT goes. Can try to restart oral medications although I suspect non-compliance is going to continue to be an issue.  - Continue to monitor daily weights, strict I/Os, and renal function. - He has a long history of non-compliance, on-going cocaine abuse, and being lost to follow-up and is not a candidate for advanced therapies. This is an unfortunate situation. He is still homeless. I fear compliance is going to be an ongoing issue and he is going to be at high risk of decompensation/readmission.    CAD: Prior cath at [facility] in 2020 showed moderate non-obstructive CAD. Coronary CTA in 03/2020 showed moderate to severe disease of RCA, LAD, and LCX. However, not felt to be candidate for invasive ischemic work-up due to noncompliance and cocaine abuse. High-sensitivity troponin minimally elevated and flat at 45 >> 38 consistent with demand ischemia and prior trends.  - He reports intermittent sharp chest pain that last for a couple of minutes and then resolves. This is a long standing issue. He was having this during his admission in 03/2022 as well. EKG, troponin unremarkable - No aspirin due to need for Eliquis. - Not on a statin here or at home. Given significant non-compliance, I don't think there is much utility in trying  to restart this. I think we should focus more on diuretic and GDMT that will help keep fluid off and keep him out of the hospital. - Not a candidate for invasive ischemic evaluation given significant non-compliance and ongoing cocaine abuse.   Sinus Bradycardia - Resolved Secondary to Precedex drip which has now been stopped. Heart rate have now improved are in the 80s to low 100s. - Continue to monitor.   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 - On Eliquis 5mg  twice daily. This was initially held due to coffee ground drainage from OT but has now been resumed.   Hypertension BP soft at times but currently stable. Can add GDMT as tolerated, though he is refusing most meds   Prolonged QTc QTc 520 ms on initial EKG and then was 602 ms on EKG 8/17. This was felt secondary to the Precedex drip which was stopped.   Improved to 528 on EKG 8/18, will monitor.  Refused EKG on 8/19   Otherwise, per primary team: - Acute hypoxic respiratory failure  - COPD/asthma - Acute toxic and metabolic encephalopathy  - Polysubstance abuse with substance and ETOH withdrawal - Anemia of chronic disease - S/p right above knee amputation   For questions or updates, please contact [facility] Please consult [email or web address] for contact info under        Signed, 9/19, MD  07/20/2022, 8:51 AM

## 2022-07-20 NOTE — Progress Notes (Signed)
PROGRESS NOTE  [name] [id number] DOB: [birth year] DOA: 07/12/2022 PCP: Marliss Coots, NP   LOS: 7 days   Brief Narrative / Interim history: 56yM with history of asthma, bipolar 1 disorder, depression, schizoaffective disorder, chronic congestive heart failure, homelessness, GERD, hypertension, QT prolongation, type 2 diabetes, alcohol abuse, cocaine and cannabis abuse, tobacco use, chronic combined systolic and diastolic heart failure, right AKA who presented to ED with gradually worsening dyspnea, LE edema, chest pressure and productive cough.  Hospital course complicated by ventilator dependent respiratory failure, eventually being extubated on 8/16.  Hospital course also complicated by persistent agitation requiring Precedex drip, causing bradycardia and eventually discontinued.  He was transferred to the hospitalist service on 8/18  [facility] Events:  8/12 admitted started on ceftriaxone, doxy, diuresed 8/13 increasing O2 requirement to 8L, refusing bipap, agitation requiring a couple CIWA based doses of ativan 8/14 Some issues with agitation overnight, currently on Fentanyl and Versed drips 8/15 Issues with hypoglycemia and mild hypotension overnight, resolved with IV hydration 8/16 No issues overnight, now off versed drip. Extubated  8/18 Transfer to hospitalist service  Subjective / 24h Interval events: Still complains of shortness of breath.  Assesement and Plan: Principal Problem:   Acute on chronic systolic congestive heart failure (HCC) Active Problems:   COPD with acute exacerbation (HCC)   Major depressive disorder, recurrent episode (HCC)   Alcohol use disorder, mild, abuse   Homelessness   Schizoaffective disorder, bipolar type ([place])   Chest pain of uncertain etiology   Acute hypoxemic respiratory failure (HCC)   Hyperlipidemia   Polysubstance abuse ([place])   Prolonged QT interval   History of DVT (deep vein thrombosis)   CAP (community  acquired pneumonia)   Pressure injury of skin   Acute on chronic systolic (congestive) heart failure (HCC)  Principal problem Acute hypoxic respiratory failure due to acute on chronic combined CHF, possible asthma/COPD overlap-PCCM initially consulted, he was admitted to the ICU and had to be intubated.  Eventually extubated 8/16.  He also has completed antibiotics with ceftriaxone and doxycycline, received 5 days with end date 8/16.  Still remains fluid overloaded, discussed with Dr. Gardiner Rhyme with cardiology, put back on IV Lasix today  Active problems Acute on chronic combined CHF-2D echo showed LVEF less than 20% with global hypokinesis, grade 2 diastolic dysfunction.  Cardiology consulted and following.  Back on IV Lasix today.  Noncompliance has been the main issue, and also he is homeless and continues to abuse cocaine.  Not a candidate for aggressive advanced interventions due to polysubstance use and noncompliance.  Coronary artery disease-most recent cath at [facility] in 2020 showed moderate nonobstructive CAD, coronary CTA in 2021 showed moderate to severe RCA disease, LAD and LCx.  Felt not to be a candidate for invasive ischemic work-up due to noncompliance and cocaine abuse.  Troponins are flat, likely representing demand ischemia  Sinus bradycardia-due to Precedex, now discontinued.  Sinus bradycardia resolved  Acute metabolic encephalopathy-likely related to ICU delirium, underlying polysubstance use disorder.  He is back to baseline  Left lower extremity wounds-they do not appear infected, continue pain control and local wound care.  Had an ABI earlier this year which looked fairly good     Right middle lobe opacity, possible pneumonia-completed ceftriaxone and doxycycline  Asthma/COPD overlap-pulmonary hygiene, I-S, mobility  History of DVT-continue Eliquis, he is on this chronically  Status post right AKA, left lower extremity wound -wound care consulted  Anemia of chronic  disease-intermittently refusing labs.  Hemoglobin  overall stable  Homelessness-TOC consult  Scheduled Meds:  apixaban  5 mg Oral BID   ARIPiprazole  5 mg Oral Daily   budesonide (PULMICORT) nebulizer solution  0.5 mg Nebulization Q12H   Chlorhexidine Gluconate Cloth  6 each Topical Daily   clonazePAM  0.5 mg Oral BID   docusate sodium  100 mg Oral BID   folic acid  1 mg Intravenous Daily   furosemide  40 mg Intravenous BID   gabapentin  400 mg Oral TID   ipratropium-albuterol  3 mL Nebulization Q6H   magnesium oxide  400 mg Oral Daily   multivitamin with minerals  1 tablet Oral Daily   mouth rinse  15 mL Mouth Rinse 4 times per day   pantoprazole (PROTONIX) IV  40 mg Intravenous Q12H   polyethylene glycol  17 g Oral Daily   potassium chloride  40 mEq Oral Daily   thiamine (VITAMIN B1) injection  100 mg Intravenous Daily   Continuous Infusions:  dextrose 10 mL/hr at 07/18/22 1000   feeding supplement (VITAL 1.5 CAL)     PRN Meds:.guaiFENesin, hydrALAZINE, ipratropium-albuterol, LORazepam, metoprolol tartrate, mouth rinse, oxyCODONE, prochlorperazine, sodium chloride flush, traZODone, white petrolatum  Diet Orders (From admission, onward)     Start     Ordered   07/20/22 0949  Diet regular Room service appropriate? Yes; Fluid consistency: Thin  Diet effective now       Comments: Please allow double portions  Question Answer Comment  Room service appropriate? Yes   Fluid consistency: Thin      07/20/22 0948            DVT prophylaxis:  apixaban (ELIQUIS) tablet 5 mg   Lab Results  Component Value Date   PLT 302 07/19/2022      Code Status: Full Code  Family Communication: no family at bedside   Status is: Inpatient Remains inpatient appropriate because: severity of illness   Level of care: Progressive  Consultants:  Cards  PCCM  Objective: Vitals:   07/19/22 1326 07/19/22 1352 07/19/22 2013 07/20/22 0500  BP:  129/73 (!) 145/87   Pulse:  92 99    Resp:  17 18   Temp:  98.2 F (36.8 C) 98.3 F (36.8 C)   TempSrc:  Oral Oral   SpO2: 98% 100% 100%   Weight:    70 kg  Height:        Intake/Output Summary (Last 24 hours) at 07/20/2022 1135 Last data filed at 07/20/2022 1115 Gross per 24 hour  Intake 1395.66 ml  Output 2150 ml  Net -754.34 ml    Wt Readings from Last 3 Encounters:  07/20/22 70 kg  05/19/22 83.1 kg  04/05/22 83.1 kg    Examination:  Constitutional: NAD Eyes: lids and conjunctivae normal, no scleral icterus ENMT: mmm Neck: normal, supple Respiratory: Bibasilar crackles, tachypneic Cardiovascular: Regular rate and rhythm, no murmurs / rubs / gallops. No LE edema. Abdomen: soft, no distention, no tenderness. Bowel sounds positive.  Skin: no rashes Neurologic: no focal deficits, equal strength    Data Reviewed: I have independently reviewed following labs and imaging studies   CBC Recent Labs  Lab 07/14/22 0355 07/16/22 0357 07/18/22 1248 07/19/22 0522  WBC 8.9 10.0 7.4 7.1  HGB 11.2* 11.5* 10.9* 10.5*  HCT 34.8* 36.5* 34.0* 33.5*  PLT 240 267 293 302  MCV 92.1 93.8 91.6 93.3  MCH 29.6 29.6 29.4 29.2  MCHC 32.2 31.5 32.1 31.3  RDW 18.1* 17.7* 17.2* 17.3*  Recent Labs  Lab 07/13/22 1850 07/13/22 2058 07/14/22 0355 07/16/22 0357 07/16/22 2300 07/17/22 0817 07/17/22 1314 07/17/22 1700 07/18/22 1248 07/18/22 2102 07/19/22 0522 07/20/22 0529  NA  --   --    < > 138  --  136  --   --  137  --  140 139  K  --   --    < > 3.9  --  3.6  --   --  3.6  --  3.7 3.6  CL  --   --    < > 104  --  101  --   --  106  --  107 106  CO2  --   --    < > 27  --  28  --   --  27  --  26 28  GLUCOSE  --   --    < > 151*  --  353*  --   --  118*  --  164* 95  BUN  --   --    < > 14  --  11  --   --  12  --  12 11  CREATININE  --   --    < > 0.88  --  0.72  --   --  0.79  --  0.79 0.78  CALCIUM  --   --    < > 7.9*  --  8.5*  --   --  8.0*  --  8.2* 8.2*  AST  --   --   --   --   --   --   --    --   --   --  15  --   ALT  --   --   --   --   --   --   --   --   --   --  12  --   ALKPHOS  --   --   --   --   --   --   --   --   --   --  73  --   BILITOT  --   --   --   --   --   --   --   --   --   --  0.5  --   ALBUMIN  --   --   --   --   --   --   --   --   --   --  2.5*  --   MG  --   --    < > 1.7   < >  --    < > 2.2 1.9 2.1 2.0 1.8  LATICACIDVEN  --  0.9  --   --   --   --   --   --   --   --   --   --   AMMONIA 22  --   --   --   --   --   --   --   --   --   --   --    < > = values in this interval not displayed.     ------------------------------------------------------------------------------------------------------------------ No results for input(s): "CHOL", "HDL", "LDLCALC", "TRIG", "CHOLHDL", "LDLDIRECT" in the last 72 hours.  Lab Results  Component Value Date   HGBA1C 7.0 (H) 03/24/2022   ------------------------------------------------------------------------------------------------------------------ No results for input(s): "TSH", "T4TOTAL", "T3FREE", "THYROIDAB" in the last  72 hours.  Invalid input(s): "FREET3"  Cardiac Enzymes No results for input(s): "CKMB", "TROPONINI", "MYOGLOBIN" in the last 168 hours.  Invalid input(s): "CK" ------------------------------------------------------------------------------------------------------------------    Component Value Date/Time   BNP 2,089.0 (H) 07/12/2022 0812    CBG: Recent Labs  Lab 07/18/22 1959 07/18/22 2358 07/19/22 2010 07/20/22 0843 07/20/22 1129  GLUCAP 118* 114* 135* 249* 214*     Recent Results (from the past 240 hour(s))  SARS Coronavirus 2 by RT PCR (hospital order, performed in [facility] hospital lab) *cepheid single result test* Anterior Nasal Swab     Status: None   Collection Time: 07/12/22 10:49 AM   Specimen: Anterior Nasal Swab  Result Value Ref Range Status   SARS Coronavirus 2 by RT PCR NEGATIVE NEGATIVE Final    Comment: (NOTE) SARS-CoV-2 target nucleic acids are NOT  DETECTED.  The SARS-CoV-2 RNA is generally detectable in upper and lower respiratory specimens during the acute phase of infection. The lowest concentration of SARS-CoV-2 viral copies this assay can detect is 250 copies / mL. A negative result does not preclude SARS-CoV-2 infection and should not be used as the sole basis for treatment or other patient management decisions.  A negative result may occur with improper specimen collection / handling, submission of specimen other than nasopharyngeal swab, presence of viral mutation(s) within the areas targeted by this assay, and inadequate number of viral copies (<250 copies / mL). A negative result must be combined with clinical observations, patient history, and epidemiological information.  Fact Sheet for Patients:   https://www.patel.info/  Fact Sheet for Healthcare Providers: https://hall.com/  This test is not yet approved or  cleared by the [place] FDA and has been authorized for detection and/or diagnosis of SARS-CoV-2 by FDA under an Emergency Use Authorization (EUA).  This EUA will remain in effect (meaning this test can be used) for the duration of the COVID-19 declaration under Section 564(b)(1) of the Act, 21 U.S.C. section 360bbb-3(b)(1), unless the authorization is terminated or revoked sooner.  Performed at [facility], [place] [address], [place], [place] [address]   MRSA Next Gen by PCR, Nasal     Status: None   Collection Time: 07/13/22  8:55 PM   Specimen: Nasal Mucosa; Nasal Swab  Result Value Ref Range Status   MRSA by PCR Next Gen NOT DETECTED NOT DETECTED Final    Comment: (NOTE) The GeneXpert MRSA Assay (FDA approved for NASAL specimens only), is one component of a comprehensive MRSA colonization surveillance program. It is not intended to diagnose MRSA infection nor to guide or monitor treatment for MRSA infections. Test performance is not  FDA approved in patients less than 18 years old. Performed at [facility], [place] [address], [name], [place] [address]   Culture, Respiratory w Gram Stain     Status: None   Collection Time: 07/13/22 11:41 PM   Specimen: Tracheal Aspirate; Respiratory  Result Value Ref Range Status   Specimen Description   Final    TRACHEAL ASPIRATE Performed at [facility] [address] [place], [place] [address]    Special Requests   Final    NONE Performed at [facility], [place] [address], [place], [place] [address]    Gram Stain   Final    NO ORGANISMS SEEN RARE WBC PRESENT, PREDOMINANTLY PMN RARE SQUAMOUS EPITHELIAL CELLS PRESENT    Culture   Final    RARE Normal respiratory flora-no Staph aureus or Pseudomonas seen  Performed at [facility], [place] [address] [place], [place] [address]    Report Status 07/16/2022 FINAL  Final     Radiology Studies: No results found.   Marzetta Board, MD, PhD [facility]  Between 7 am - 7 pm I am available, please contact me via Amion (for emergencies) or Securechat (non urgent messages)  Between 7 pm - 7 am I am not available, please contact night coverage MD/APP via Amion

## 2022-07-20 NOTE — Plan of Care (Signed)
  Problem: Clinical Measurements: Goal: Ability to maintain clinical measurements within normal limits will improve Outcome: Progressing   Problem: Clinical Measurements: Goal: Will remain free from infection Outcome: Progressing   Problem: Clinical Measurements: Goal: Respiratory complications will improve Outcome: Progressing   Problem: Clinical Measurements: Goal: Cardiovascular complication will be avoided Outcome: Progressing   Problem: Coping: Goal: Level of anxiety will decrease Outcome: Progressing   Problem: Safety: Goal: Ability to remain free from injury will improve Outcome: Progressing

## 2022-07-21 DIAGNOSIS — I5023 Acute on chronic systolic (congestive) heart failure: Secondary | ICD-10-CM | POA: Diagnosis not present

## 2022-07-21 DIAGNOSIS — J9601 Acute respiratory failure with hypoxia: Secondary | ICD-10-CM

## 2022-07-21 DIAGNOSIS — I5043 Acute on chronic combined systolic (congestive) and diastolic (congestive) heart failure: Secondary | ICD-10-CM | POA: Diagnosis not present

## 2022-07-21 DIAGNOSIS — F1910 Other psychoactive substance abuse, uncomplicated: Secondary | ICD-10-CM | POA: Diagnosis not present

## 2022-07-21 LAB — BASIC METABOLIC PANEL
Anion gap: 7 (ref 5–15)
BUN: 7 mg/dL (ref 6–20)
CO2: 28 mmol/L (ref 22–32)
Calcium: 8 mg/dL — ABNORMAL LOW (ref 8.9–10.3)
Chloride: 101 mmol/L (ref 98–111)
Creatinine, Ser: 0.77 mg/dL (ref 0.61–1.24)
GFR, Estimated: >60 mL/min (ref >60)
Glucose, Bld: 114 mg/dL — ABNORMAL HIGH (ref 70–99)
Potassium: 3.3 mmol/L — ABNORMAL LOW (ref 3.5–5.1)
Sodium: 136 mmol/L (ref 135–145)

## 2022-07-21 LAB — GLUCOSE, CAPILLARY
Glucose-Capillary: 135 mg/dL — ABNORMAL HIGH (ref 70–99)
Glucose-Capillary: 142 mg/dL — ABNORMAL HIGH (ref 70–99)
Glucose-Capillary: 153 mg/dL — ABNORMAL HIGH (ref 70–99)
Glucose-Capillary: 190 mg/dL — ABNORMAL HIGH (ref 70–99)

## 2022-07-21 LAB — MAGNESIUM: Magnesium: 1.7 mg/dL (ref 1.7–2.4)

## 2022-07-21 MED ORDER — CARVEDILOL 3.125 MG PO TABS
3.1250 mg | ORAL_TABLET | Freq: Two times a day (BID) | ORAL | Status: DC
  Administered 2022-07-21 – 2022-07-25 (×7): 3.125 mg via ORAL
  Filled 2022-07-21 (×8): qty 1

## 2022-07-21 MED ORDER — PANTOPRAZOLE SODIUM 40 MG PO TBEC
40.0000 mg | DELAYED_RELEASE_TABLET | Freq: Two times a day (BID) | ORAL | Status: DC
  Administered 2022-07-21 – 2022-07-22 (×2): 40 mg via ORAL
  Filled 2022-07-21 (×4): qty 1

## 2022-07-21 NOTE — Progress Notes (Addendum)
Physical Therapy Treatment Patient Details Name: [name] MRN: [id number] DOB: 1966/03/03 Today's Date: 07/21/2022   History of Present Illness Pt is 56 yo male who presented to ED with with progressively worse dyspnea associated with lower extremity edema, orthopnea, occasional chest pressure sensation and whitish sputum productive cough after being off medications for the past 2 weeks. 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    PT Comments    Pt reports his WC is not functioning well, it is very difficult to propel forward. Upon inspection, WC was noted to have significant resistance to being pushed forward, and appears to be in general disrepair. Pt does not have a prosthesis for RLE, so WC is his primary mode of mobility. New WC recommended. Pt was assisted from supine to sitting at edge of bed. He sat at the edge of bed for several minutes with good balance. He refused to attempt a transfer to the recliner.    Recommendations for follow up therapy are one component of a multi-disciplinary discharge planning process, led by the attending physician.  Recommendations may be updated based on patient status, additional functional criteria and insurance authorization.  Follow Up Recommendations  Skilled nursing-short term rehab (<3 hours/day) Can patient physically be transported by private vehicle: No   Assistance Recommended at Discharge Frequent or constant Supervision/Assistance  Patient can return home with the following Assist for transportation;Assistance with cooking/housework;A lot of help with bathing/dressing/bathroom;A lot of help with walking and/or transfers   Equipment Recommendations  Wheelchair  (measurements PT);Wheelchair cushion (measurements PT) (pt's WC is in disrepair, it is his only mode of mobility as he does not have a prosthetic leg)    Recommendations for Other Services       Precautions / Restrictions Precautions Precautions: Fall Precaution Comments: monitor O2 Restrictions Weight Bearing Restrictions: No     Mobility  Bed Mobility Overal bed mobility: Needs Assistance Bed Mobility: Supine to Sit Rolling: Min assist         General bed mobility comments: min A to raise trunk    Transfers                   General transfer comment: pt refused to attempt a transfer    Ambulation/Gait                   Stairs             Wheelchair Mobility    Modified Rankin (Stroke Patients Only)       Balance Overall balance assessment: Needs assistance Sitting-balance support: Feet supported, No upper extremity supported Sitting balance-Leahy Scale: Fair Sitting balance - Comments: sat at edge of bed without loss of balance                                    Cognition Arousal/Alertness: Awake/alert Behavior During Therapy: WFL for tasks assessed/performed Overall Cognitive Status: No family/caregiver present to determine baseline cognitive functioning                                          Exercises  General Comments        Pertinent Vitals/Pain Pain Assessment Faces Pain Scale: Hurts little more Pain Location: LE/foot L Pain Descriptors / Indicators: Grimacing Pain Intervention(s): Limited activity within patient's tolerance, Monitored during session, Premedicated before session    Home Living                          Prior Function            PT Goals (current goals can now be found in the care plan section) Acute Rehab PT Goals Patient Stated Goal: to get a new WC PT Goal Formulation: With patient Time For Goal Achievement: 07/27/22 Potential to Achieve  Goals: Fair Progress towards PT goals: Progressing toward goals    Frequency    Min 2X/week      PT Plan Current plan remains appropriate    Co-evaluation              AM-PAC PT "6 Clicks" Mobility   Outcome Measure  Help needed turning from your back to your side while in a flat bed without using bedrails?: A Lot Help needed moving from lying on your back to sitting on the side of a flat bed without using bedrails?: A Little Help needed moving to and from a bed to a chair (including a wheelchair)?: Total Help needed standing up from a chair using your arms (e.g., wheelchair or bedside chair)?: Total Help needed to walk in hospital room?: Total Help needed climbing 3-5 steps with a railing? : Total 6 Click Score: 9    End of Session   Activity Tolerance: Other (comment) (pt refused transfers) Patient left: in bed;with call bell/phone within reach;with bed alarm set;with nursing/sitter in room Nurse Communication: Mobility status PT Visit Diagnosis: Other abnormalities of gait and mobility (R26.89);Pain Pain - Right/Left: Left Pain - part of body: Ankle and joints of foot     Time: 8185-6314 PT Time Calculation (min) (ACUTE ONLY): 11 min  Charges:  $Therapeutic Activity: 8-22 mins                    Ralene Bathe Kistler PT 07/21/2022  Acute Rehabilitation Services  Office [phone]

## 2022-07-21 NOTE — Progress Notes (Signed)
PROGRESS NOTE  [name] ZOX:[id number] DOB: December 23, 1965 DOA: 07/12/2022 PCP: [name], NP   LOS: 8 days   Brief Narrative / Interim history: 56yM with history of asthma, bipolar 1 disorder, depression, schizoaffective disorder, chronic congestive heart failure, homelessness, GERD, hypertension, QT prolongation, type 2 diabetes, alcohol abuse, cocaine and cannabis abuse, tobacco use, chronic combined systolic and diastolic heart failure, right AKA who presented to ED with gradually worsening dyspnea, LE edema, chest pressure and productive cough.  Hospital course complicated by ventilator dependent respiratory failure, eventually being extubated on 8/16.  Hospital course also complicated by persistent agitation requiring Precedex drip, causing bradycardia and eventually discontinued.  He was transferred to the hospitalist service on 8/18  Significant Hospital Events:  8/12 admitted started on ceftriaxone, doxy, diuresed 8/13 increasing O2 requirement to 8L, refusing bipap, agitation requiring a couple CIWA based doses of ativan 8/14 Some issues with agitation overnight, currently on Fentanyl and Versed drips 8/15 Issues with hypoglycemia and mild hypotension overnight, resolved with IV hydration 8/16 No issues overnight, now off versed drip. Extubated  8/18 Transfer to hospitalist service  Subjective / 24h Interval events: Complains of shortness of breath, no chest pain this morning  Assesement and Plan: Principal Problem:   Acute on chronic systolic congestive heart failure (HCC) Active Problems:   COPD with acute exacerbation (HCC)   Major depressive disorder, recurrent episode (HCC)   Alcohol use disorder, mild, abuse   Homelessness   Schizoaffective disorder, bipolar type (HCC)   Chest pain of uncertain etiology   Acute hypoxemic respiratory failure (HCC)   Hyperlipidemia   Polysubstance abuse (HCC)   Prolonged QT interval   History of DVT (deep vein thrombosis)    CAP (community acquired pneumonia)   Pressure injury of skin   Acute on chronic systolic (congestive) heart failure (HCC)  Principal problem Acute hypoxic respiratory failure due to acute on chronic combined CHF, possible asthma/COPD overlap-PCCM initially consulted, he was admitted to the ICU and had to be intubated.  Eventually extubated 8/16.  He also has completed antibiotics with ceftriaxone and doxycycline, received 5 days with end date 8/16.  Still remains fluid overloaded, continue IV Lasix  Active problems Acute on chronic combined CHF-2D echo showed LVEF less than 20% with global hypokinesis, grade 2 diastolic dysfunction.  Cardiology consulted and following.  Back on IV Lasix today.  Noncompliance has been the main issue, and also he is homeless and continues to abuse cocaine.  Not a candidate for aggressive advanced interventions due to polysubstance use and noncompliance.  Coronary artery disease-most recent cath at [facility] in 2020 showed moderate nonobstructive CAD, coronary CTA in 2021 showed moderate to severe RCA disease, LAD and LCx.  Felt not to be a candidate for invasive ischemic work-up due to noncompliance and cocaine abuse.  Troponins are flat, likely representing demand ischemia  Sinus bradycardia-due to Precedex, now discontinued.  Sinus bradycardia resolved  Acute metabolic encephalopathy-likely related to ICU delirium, underlying polysubstance use disorder.  He is back to baseline  Left lower extremity wounds-they do not appear infected, continue pain control and local wound care.  Had an ABI earlier this year which looked fairly good  Right middle lobe opacity, possible pneumonia-completed ceftriaxone and doxycycline  Asthma/COPD overlap-pulmonary hygiene, I-S, mobility  History of DVT-continue Eliquis, he is on this chronically  Status post right AKA, left lower extremity wound -wound care consulted  Anemia of chronic disease-intermittently refusing labs.   Hemoglobin overall stable  Homelessness-TOC consult  Scheduled Meds:  apixaban  5 mg Oral BID   ARIPiprazole  5 mg Oral Daily   budesonide (PULMICORT) nebulizer solution  0.5 mg Nebulization Q12H   carvedilol  3.125 mg Oral BID WC   Chlorhexidine Gluconate Cloth  6 each Topical Daily   clonazePAM  0.5 mg Oral BID   docusate sodium  100 mg Oral BID   folic acid  1 mg Intravenous Daily   furosemide  40 mg Intravenous BID   gabapentin  400 mg Oral TID   ipratropium-albuterol  3 mL Nebulization BID   magnesium oxide  400 mg Oral Daily   multivitamin with minerals  1 tablet Oral Daily   mouth rinse  15 mL Mouth Rinse 4 times per day   pantoprazole  40 mg Oral BID   polyethylene glycol  17 g Oral Daily   potassium chloride  40 mEq Oral Daily   thiamine (VITAMIN B1) injection  100 mg Intravenous Daily   Continuous Infusions:  dextrose 10 mL/hr at 07/18/22 1000   feeding supplement (VITAL 1.5 CAL)     PRN Meds:.guaiFENesin, hydrALAZINE, ipratropium-albuterol, LORazepam, metoprolol tartrate, mouth rinse, oxyCODONE, prochlorperazine, sodium chloride flush, traZODone, white petrolatum  Diet Orders (From admission, onward)     Start     Ordered   07/20/22 0949  Diet regular Room service appropriate? Yes; Fluid consistency: Thin  Diet effective now       Comments: Please allow double portions  Question Answer Comment  Room service appropriate? Yes   Fluid consistency: Thin      07/20/22 0948            DVT prophylaxis:  apixaban (ELIQUIS) tablet 5 mg   Lab Results  Component Value Date   PLT 302 07/19/2022      Code Status: Full Code  Family Communication: no family at bedside   Status is: Inpatient Remains inpatient appropriate because: severity of illness   Level of care: Progressive  Consultants:  Cards  PCCM  Objective: Vitals:   07/21/22 0500 07/21/22 0505 07/21/22 0815 07/21/22 0816  BP:  118/71    Pulse:  94    Resp:  20    Temp:  98.9 F (37.2 C)     TempSrc:  Oral    SpO2:  100% 98% 98%  Weight: 69.1 kg     Height:        Intake/Output Summary (Last 24 hours) at 07/21/2022 1336 Last data filed at 07/21/2022 1236 Gross per 24 hour  Intake 919.26 ml  Output 3300 ml  Net -2380.74 ml    Wt Readings from Last 3 Encounters:  07/21/22 69.1 kg  05/19/22 83.1 kg  04/05/22 83.1 kg    Examination:  Constitutional: NAD Eyes: lids and conjunctivae normal, no scleral icterus ENMT: mmm Neck: normal, supple Respiratory: Overall clear, no wheezing.  Tachypneic Cardiovascular: Regular rate and rhythm, no murmurs / rubs / gallops. No LE edema. Abdomen: soft, no distention, no tenderness. Bowel sounds positive.  Skin: no rashes Neurologic: no focal deficits, equal strength    Data Reviewed: I have independently reviewed following labs and imaging studies   CBC Recent Labs  Lab 07/16/22 0357 07/18/22 1248 07/19/22 0522  WBC 10.0 7.4 7.1  HGB 11.5* 10.9* 10.5*  HCT 36.5* 34.0* 33.5*  PLT 267 293 302  MCV 93.8 91.6 93.3  MCH 29.6 29.4 29.2  MCHC 31.5 32.1 31.3  RDW 17.7* 17.2* 17.3*     Recent Labs  Lab 07/17/22 0817 07/17/22 1314 07/18/22  1248 07/18/22 2102 07/19/22 0522 07/20/22 0529 07/21/22 0712  NA 136  --  137  --  140 139 136  K 3.6  --  3.6  --  3.7 3.6 3.3*  CL 101  --  106  --  107 106 101  CO2 28  --  27  --  26 28 28   GLUCOSE 353*  --  118*  --  164* 95 114*  BUN 11  --  12  --  12 11 7   CREATININE 0.72  --  0.79  --  0.79 0.78 0.77  CALCIUM 8.5*  --  8.0*  --  8.2* 8.2* 8.0*  AST  --   --   --   --  15  --   --   ALT  --   --   --   --  12  --   --   ALKPHOS  --   --   --   --  73  --   --   BILITOT  --   --   --   --  0.5  --   --   ALBUMIN  --   --   --   --  2.5*  --   --   MG  --    < > 1.9 2.1 2.0 1.8 1.7   < > = values in this interval not displayed.     ------------------------------------------------------------------------------------------------------------------ No results for  input(s): "CHOL", "HDL", "LDLCALC", "TRIG", "CHOLHDL", "LDLDIRECT" in the last 72 hours.  Lab Results  Component Value Date   HGBA1C 7.0 (H) 03/24/2022   ------------------------------------------------------------------------------------------------------------------ No results for input(s): "TSH", "T4TOTAL", "T3FREE", "THYROIDAB" in the last 72 hours.  Invalid input(s): "FREET3"  Cardiac Enzymes No results for input(s): "CKMB", "TROPONINI", "MYOGLOBIN" in the last 168 hours.  Invalid input(s): "CK" ------------------------------------------------------------------------------------------------------------------    Component Value Date/Time   BNP 2,089.0 (H) 07/12/2022 0812    CBG: Recent Labs  Lab 07/20/22 1129 07/20/22 1642 07/20/22 2211 07/21/22 0743 07/21/22 1121  GLUCAP 214* 108* 281* 135* 142*     Recent Results (from the past 240 hour(s))  SARS Coronavirus 2 by RT PCR (hospital order, performed in [facility] hospital lab) *cepheid single result test* Anterior Nasal Swab     Status: None   Collection Time: 07/12/22 10:49 AM   Specimen: Anterior Nasal Swab  Result Value Ref Range Status   SARS Coronavirus 2 by RT PCR NEGATIVE NEGATIVE Final    Comment: (NOTE) SARS-CoV-2 target nucleic acids are NOT DETECTED.  The SARS-CoV-2 RNA is generally detectable in upper and lower respiratory specimens during the acute phase of infection. The lowest concentration of SARS-CoV-2 viral copies this assay can detect is 250 copies / mL. A negative result does not preclude SARS-CoV-2 infection and should not be used as the sole basis for treatment or other patient management decisions.  A negative result may occur with improper specimen collection / handling, submission of specimen other than nasopharyngeal swab, presence of viral mutation(s) within the areas targeted by this assay, and inadequate number of viral copies (<250 copies / mL). A negative result must be combined  with clinical observations, patient history, and epidemiological information.  Fact Sheet for Patients:   https://www.patel.info/  Fact Sheet for Healthcare Providers: https://hall.com/  This test is not yet approved or  cleared by the [place] FDA and has been authorized for detection and/or diagnosis of SARS-CoV-2 by FDA under an Emergency Use Authorization (EUA).  This  EUA will remain in effect (meaning this test can be used) for the duration of the COVID-19 declaration under Section 564(b)(1) of the Act, 21 U.S.C. section 360bbb-3(b)(1), unless the authorization is terminated or revoked sooner.  Performed at [facility], [address], [place], [name] [address]   MRSA Next Gen by PCR, Nasal     Status: None   Collection Time: 07/13/22  8:55 PM   Specimen: Nasal Mucosa; Nasal Swab  Result Value Ref Range Status   MRSA by PCR Next Gen NOT DETECTED NOT DETECTED Final    Comment: (NOTE) The GeneXpert MRSA Assay (FDA approved for NASAL specimens only), is one component of a comprehensive MRSA colonization surveillance program. It is not intended to diagnose MRSA infection nor to guide or monitor treatment for MRSA infections. Test performance is not FDA approved in patients less than 93 years old. Performed at [facility], [address], [place], [name] [address]   Culture, Respiratory w Gram Stain     Status: None   Collection Time: 07/13/22 11:41 PM   Specimen: Tracheal Aspirate; Respiratory  Result Value Ref Range Status   Specimen Description   Final    TRACHEAL ASPIRATE Performed at [facility], [address], [place], [name] [address]    Special Requests   Final    NONE Performed at [facility], [address], [place], [name] [address]    Gram Stain   Final    NO ORGANISMS SEEN RARE WBC PRESENT, PREDOMINANTLY PMN RARE SQUAMOUS  EPITHELIAL CELLS PRESENT    Culture   Final    RARE Normal respiratory flora-no Staph aureus or Pseudomonas seen Performed at [facility], [address], [place], [name] [address]    Report Status 07/16/2022 FINAL  Final     Radiology Studies: No results found.   Pamella Pert, MD, PhD [facility]  Between 7 am - 7 pm I am available, please contact me via Amion (for emergencies) or Securechat (non urgent messages)  Between 7 pm - 7 am I am not available, please contact night coverage MD/APP via Amion

## 2022-07-21 NOTE — Plan of Care (Signed)
?  Problem: Clinical Measurements: ?Goal: Diagnostic test results will improve ?Outcome: Progressing ?  ?Problem: Safety: ?Goal: Ability to remain free from injury will improve ?Outcome: Progressing ?  ?

## 2022-07-21 NOTE — Progress Notes (Addendum)
Progress Note  Patient Name: [name] Date of Encounter: 07/21/2022  [facility] Cardiologist: Rollene Rotunda, MD   Subjective   He is not cooperative with question answering this morning, he wants to see his nurse, he states his call bell is not plugged in. He is able to speak full sentence, mild DOE.   Inpatient Medications    Scheduled Meds:  apixaban  5 mg Oral BID   ARIPiprazole  5 mg Oral Daily   budesonide (PULMICORT) nebulizer solution  0.5 mg Nebulization Q12H   carvedilol  3.125 mg Oral BID WC   Chlorhexidine Gluconate Cloth  6 each Topical Daily   clonazePAM  0.5 mg Oral BID   docusate sodium  100 mg Oral BID   folic acid  1 mg Intravenous Daily   furosemide  40 mg Intravenous BID   gabapentin  400 mg Oral TID   ipratropium-albuterol  3 mL Nebulization BID   magnesium oxide  400 mg Oral Daily   multivitamin with minerals  1 tablet Oral Daily   mouth rinse  15 mL Mouth Rinse 4 times per day   pantoprazole (PROTONIX) IV  40 mg Intravenous Q12H   polyethylene glycol  17 g Oral Daily   potassium chloride  40 mEq Oral Daily   thiamine (VITAMIN B1) injection  100 mg Intravenous Daily   Continuous Infusions:  dextrose 10 mL/hr at 07/18/22 1000   feeding supplement (VITAL 1.5 CAL)     PRN Meds: guaiFENesin, hydrALAZINE, ipratropium-albuterol, LORazepam, metoprolol tartrate, mouth rinse, oxyCODONE, prochlorperazine, sodium chloride flush, traZODone, white petrolatum   Vital Signs    Vitals:   07/21/22 0500 07/21/22 0505 07/21/22 0815 07/21/22 0816  BP:  118/71    Pulse:  94    Resp:  20    Temp:  98.9 F (37.2 C)    TempSrc:  Oral    SpO2:  100% 98% 98%  Weight: 69.1 kg     Height:        Intake/Output Summary (Last 24 hours) at 07/21/2022 0849 Last data filed at 07/21/2022 0700 Gross per 24 hour  Intake 439.26 ml  Output 3000 ml  Net -2560.74 ml      07/21/2022    5:00 AM 07/20/2022    5:00 AM 07/19/2022    5:00 AM  Last 3 Weights  Weight  (lbs) 152 lb 5.4 oz 154 lb 5.2 oz 154 lb 1.6 oz  Weight (kg) 69.1 kg 70 kg 69.9 kg      Telemetry    NSR with occasional PVCs , sinus tachycardia 110s at times  - Personally Reviewed  ECG    No new ECG tracing today. - Personally Reviewed  Physical Exam   GEN: chronically ill appearing Neck: no significant JVD. Cardiac: RRR. No murmurs, rubs, or gallops.  Respiratory: Lung sound clear bilaterally  GI: Soft, non-distended MS: No lower extremity edema. S/p right above knee amputation. Skin: Warm and dry. Neuro:  No focal deficits. Psych: Normal affect.   Labs    High Sensitivity Troponin:   Recent Labs  Lab 07/13/22 1850 07/13/22 2213 07/18/22 1248  TROPONINIHS 45* 38* 18*     Chemistry Recent Labs  Lab 07/18/22 1248 07/18/22 2102 07/19/22 0522 07/20/22 0529  NA 137  --  140 139  K 3.6  --  3.7 3.6  CL 106  --  107 106  CO2 27  --  26 28  GLUCOSE 118*  --  164* 95  BUN  12  --  12 11  CREATININE 0.79  --  0.79 0.78  CALCIUM 8.0*  --  8.2* 8.2*  MG 1.9 2.1 2.0 1.8  PROT  --   --  6.5  --   ALBUMIN  --   --  2.5*  --   AST  --   --  15  --   ALT  --   --  12  --   ALKPHOS  --   --  73  --   BILITOT  --   --  0.5  --   GFRNONAA >60  --  >60 >60  ANIONGAP 4*  --  7 5    Lipids  No results for input(s): "CHOL", "TRIG", "HDL", "LABVLDL", "LDLCALC", "CHOLHDL" in the last 168 hours.   Hematology Recent Labs  Lab 07/16/22 0357 07/18/22 1248 07/19/22 0522  WBC 10.0 7.4 7.1  RBC 3.89* 3.71* 3.59*  HGB 11.5* 10.9* 10.5*  HCT 36.5* 34.0* 33.5*  MCV 93.8 91.6 93.3  MCH 29.6 29.4 29.2  MCHC 31.5 32.1 31.3  RDW 17.7* 17.2* 17.3*  PLT 267 293 302   Thyroid No results for input(s): "TSH", "FREET4" in the last 168 hours.  BNP No results for input(s): "BNP", "PROBNP" in the last 168 hours.   DDimer No results for input(s): "DDIMER" in the last 168 hours.   Radiology    No results found.  Cardiac Studies   Echocardiogram 07/14/2022:  Impressions:   1. Left ventricular ejection fraction, by estimation, is <20%. Left  ventricular ejection fraction by 3D volume is 20 %. The left ventricle has  severely decreased function. The left ventricle demonstrates global  hypokinesis. The left ventricular internal   cavity size was moderately dilated. There is mild left ventricular  hypertrophy. Left ventricular diastolic parameters are consistent with  Grade II diastolic dysfunction (pseudonormalization). Elevated left  ventricular end-diastolic pressure. The E/e' is   24.   2. Right ventricular systolic function is moderately reduced. The right  ventricular size is mildly enlarged. There is normal pulmonary artery  systolic pressure. The estimated right ventricular systolic pressure is  26.7 mmHg.   3. The mitral valve is abnormal. Trivial mitral valve regurgitation.   4. The aortic valve is tricuspid. Aortic valve regurgitation is not  visualized.   5. The inferior vena cava is normal in size with <50% respiratory  variability, suggesting right atrial pressure of 8 mmHg.   Comparison(s): No significant change from prior study. 01/30/2022: LVEF  <20%, grade 3 DD, elevated LV filling pressure.   Patient Profile     56 y.o. 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 He has had multiple admission over the last 8 months for a variety of complaints including acute on chronic CHF. He was admitted on 07/12/2022 for acute hypoxic respiratory failure secondary to acute on chronic CHF, COPD exacerbation, and pneumonia after presenting with shortness of breath. He reported being non-compliant with his home  Lasix. He ultimately required intubation on 07/13/2022 due to rising O2 requirement and refusing BiPAP due to agitation. He was able to be extubated on 07/16/2022.  Assessment & Plan    Acute on Chronic Combined CHF:  BNP elevated at 2,089. Chest x-ray showed findings concerning for pneumonia but no overt edema. Echo showed LVEF of <20% with global hypokinesis, grade 2 diastolic dysfunction, and elevated LVEDP. RV mildly enlarged with moderately reduced RV systolic function and normal PASP.  Required intubation on admission, now extubated and on 4 L [place].  He has been diuresed with IV Lasix. Net negative 6.4L this admission. - continue IV Lasix 40mg  BID for another 24 hours, repeat BMP today pending, he continues to refuse some medications which complicates his care - Maintain K greater than 4, mag greater than 2 - He was only on Coreg at home for GDMT,  will resume Coreg today, suspect he would continue to be non-compliant with meds, will not add additional GDMT at this time until he demonstrate behavior change  - Continue to monitor daily weights, strict I/Os, and renal function. - He has a long history of non-compliance, on-going cocaine abuse, and being lost to follow-up and is not a candidate for advanced therapies. Appears compliance is going to be an ongoing issue and he is going to be at high risk of decompensation/readmission.    CAD: Prior cath at [facility] in 2020 showed moderate non-obstructive CAD. Coronary CTA in 03/2020 showed moderate to severe disease of RCA, LAD, and LCX. However, not felt to be candidate for invasive ischemic work-up due to noncompliance and cocaine abuse. High-sensitivity troponin minimally elevated and flat at 45 >> 38 consistent with demand ischemia and prior trends.  - He reports intermittent sharp chest pain that last for a couple of minutes and then resolves. This is a long standing issue. He was having this during his admission in 03/2022 as well. EKG, troponin  unremarkable - No aspirin due to need for Eliquis. - Not on a statin here or at home. Given significant non-compliance, don't think there is much utility in trying to restart this. We will focus on diuretic and GDMT that will help keep fluid off and keep him out of the hospital. - Not a candidate for invasive ischemic evaluation given significant non-compliance and ongoing cocaine abuse.   Sinus Bradycardia - Resolved Secondary to Precedex drip which has now been stopped. Heart rate have now improved are in the 80s to low 100s. - Continue to monitor.   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 - On Eliquis 5mg  twice daily. This was initially held due to coffee ground drainage from OT but has now been resumed.   Hypertension BP soft at times but currently stable. Can add GDMT as tolerated, though he is refusing most meds   Prolonged QTc QTc 520 ms on initial EKG and then was 602 ms on EKG 8/17. This was felt secondary to the Precedex drip which was stopped.   Improved to 528 on EKG 8/18, will monitor.  Refused EKG on 8/19   Otherwise, per primary team: - Acute hypoxic respiratory failure  - COPD/asthma - Acute toxic and metabolic encephalopathy  - Polysubstance abuse with substance and ETOH withdrawal - Anemia of chronic disease - S/p right above knee amputation    For questions or updates, please contact [place] Please consult [email or web address] for contact info under        Signed, Margie Billet, NP  07/21/2022, 8:49 AM    Personally seen and examined. Agree with APP above with the following comments:  Briefly 56 yo M with a history of HFrEF EV < 123456, CAD 3VD, complicated by Cocaine mis-use and lack  of being homed; complicated by chronic pain.   Patient notes that he is amenable to take his cardiac meds;  Has som slight leg swelling.  Notes that if he can just get a Education officer, museum he will be able to get his meds, a place to stay, and will be able to stop  cocaine use. Exam notable for Decreased breath sounds in bases, non pitting LE edema, otherwise as above.  Tele: SR with PVCs  Would recommend  - Patient amenable to take coreg will start and uptitrate if feasible - continue IV lasix - will defer other GDMT to outpatient - if he can get social work support; we would attempt outpatient CMR prior to [facility] for viability; he would ne a lot of support to get to this point. - peri-DC repeat EKG would be useful if he would be amenable (Qtc)   Rest as above  Rudean Haskell, MD [address] [place], [place] [address] [phone]  1:09 PM

## 2022-07-21 NOTE — Progress Notes (Signed)
Pt refused to allow vital signs checked prior to nebulizer treatment given.

## 2022-07-21 NOTE — TOC Progression Note (Addendum)
Transition of Care [facility]) - Progression Note    Patient Details  Name: [name] MRN: [id number] Date of Birth: 05-14-1966  [facility] [place] CM/SW Contact  Lavenia Atlas, RN Phone Number: 07/21/2022, 4:20 PM  Clinical Narrative:   Patient is known to the [facility] department/hospital. PT has recommended short term SNF placement. Per chart review patient has been declined by multiple SNFs in the past. Notified PT to increase PT frequency while inpatient as patient is difficult to place. This RNCM will initiate the SNF placement process. This RNCM unable to assist with ALF or housing placement, referred patient to his DSS SW. Patient reports he does not know who his DSS SW is.  TOC will continue to monitor.       Expected Discharge Plan: Homeless Shelter Barriers to Discharge: Continued Medical Work up  Expected Discharge Plan and Services Expected Discharge Plan: Homeless Shelter   Discharge Planning Services: CM Consult Post Acute Care Choice: NA (Patient had walker and wheelchair given to him and is not eligible for any DME for 5 years.) Living arrangements for the past 2 months: Homeless Shelter, Hotel/Motel                   DME Agency: NA       HH Arranged: NA HH Agency: NA         Social Determinants of Health (SDOH) Interventions    Readmission Risk Interventions    07/18/2022    4:11 PM 04/08/2022    1:43 PM 03/25/2022    5:39 PM  Readmission Risk Prevention Plan  Transportation Screening Complete Complete Complete  Medication Review Oceanographer) Complete Complete Referral to Pharmacy  PCP or Specialist appointment within 3-5 days of discharge Complete Complete Complete  HRI or Home Care Consult Complete Not Complete Not Complete  HRI or Home Care Consult Pt Refusal Comments  Pt is homeless Patient is homeless  SW Recovery Care/Counseling Consult Complete Complete Complete  Palliative Care Screening  Not Applicable Not Applicable  Skilled Nursing  Facility Complete Not Applicable Complete

## 2022-07-22 ENCOUNTER — Inpatient Hospital Stay (HOSPITAL_COMMUNITY): Payer: Medicaid Other

## 2022-07-22 DIAGNOSIS — I5043 Acute on chronic combined systolic (congestive) and diastolic (congestive) heart failure: Secondary | ICD-10-CM | POA: Diagnosis not present

## 2022-07-22 DIAGNOSIS — F1910 Other psychoactive substance abuse, uncomplicated: Secondary | ICD-10-CM | POA: Diagnosis not present

## 2022-07-22 DIAGNOSIS — J9601 Acute respiratory failure with hypoxia: Secondary | ICD-10-CM | POA: Diagnosis not present

## 2022-07-22 DIAGNOSIS — I5023 Acute on chronic systolic (congestive) heart failure: Secondary | ICD-10-CM | POA: Diagnosis not present

## 2022-07-22 LAB — BASIC METABOLIC PANEL
Anion gap: 6 (ref 5–15)
BUN: 9 mg/dL (ref 6–20)
CO2: 29 mmol/L (ref 22–32)
Calcium: 8.2 mg/dL — ABNORMAL LOW (ref 8.9–10.3)
Chloride: 103 mmol/L (ref 98–111)
Creatinine, Ser: 0.9 mg/dL (ref 0.61–1.24)
GFR, Estimated: >60 mL/min (ref >60)
Glucose, Bld: 125 mg/dL — ABNORMAL HIGH (ref 70–99)
Potassium: 3.6 mmol/L (ref 3.5–5.1)
Sodium: 138 mmol/L (ref 135–145)

## 2022-07-22 LAB — GLUCOSE, CAPILLARY
Glucose-Capillary: 137 mg/dL — ABNORMAL HIGH (ref 70–99)
Glucose-Capillary: 137 mg/dL — ABNORMAL HIGH (ref 70–99)
Glucose-Capillary: 139 mg/dL — ABNORMAL HIGH (ref 70–99)
Glucose-Capillary: 176 mg/dL — ABNORMAL HIGH (ref 70–99)
Glucose-Capillary: 214 mg/dL — ABNORMAL HIGH (ref 70–99)

## 2022-07-22 LAB — MAGNESIUM: Magnesium: 1.7 mg/dL (ref 1.7–2.4)

## 2022-07-22 LAB — BRAIN NATRIURETIC PEPTIDE: B Natriuretic Peptide: 611.6 pg/mL — ABNORMAL HIGH (ref 0.0–100.0)

## 2022-07-22 MED ORDER — FUROSEMIDE 40 MG PO TABS
40.0000 mg | ORAL_TABLET | Freq: Two times a day (BID) | ORAL | Status: DC
  Administered 2022-07-22 – 2022-07-25 (×5): 40 mg via ORAL
  Filled 2022-07-22 (×6): qty 1

## 2022-07-22 MED ORDER — THIAMINE MONONITRATE 100 MG PO TABS
100.0000 mg | ORAL_TABLET | Freq: Every day | ORAL | Status: DC
  Filled 2022-07-22 (×6): qty 1

## 2022-07-22 MED ORDER — FOLIC ACID 1 MG PO TABS
1.0000 mg | ORAL_TABLET | Freq: Every day | ORAL | Status: DC
  Filled 2022-07-22 (×3): qty 1

## 2022-07-22 NOTE — Progress Notes (Addendum)
Progress Note  Patient Name: [name] Date of Encounter: 07/22/2022  [facility] Cardiologist: Minus Breeding, MD   Subjective   Breathing improving but not at baseline yet. No chest pain. Requesting medications and social help.   Inpatient Medications    Scheduled Meds:  apixaban  5 mg Oral BID   ARIPiprazole  5 mg Oral Daily   budesonide (PULMICORT) nebulizer solution  0.5 mg Nebulization Q12H   carvedilol  3.125 mg Oral BID WC   Chlorhexidine Gluconate Cloth  6 each Topical Daily   clonazePAM  0.5 mg Oral BID   docusate sodium  100 mg Oral BID   folic acid  1 mg Oral Daily   furosemide  40 mg Intravenous BID   gabapentin  400 mg Oral TID   ipratropium-albuterol  3 mL Nebulization BID   magnesium oxide  400 mg Oral Daily   multivitamin with minerals  1 tablet Oral Daily   mouth rinse  15 mL Mouth Rinse 4 times per day   pantoprazole  40 mg Oral BID   polyethylene glycol  17 g Oral Daily   potassium chloride  40 mEq Oral Daily   thiamine  100 mg Oral Daily   Continuous Infusions:  dextrose 10 mL/hr at 07/18/22 1000   feeding supplement (VITAL 1.5 CAL)     PRN Meds: guaiFENesin, hydrALAZINE, ipratropium-albuterol, LORazepam, metoprolol tartrate, mouth rinse, oxyCODONE, prochlorperazine, sodium chloride flush, traZODone, white petrolatum   Vital Signs    Vitals:   07/21/22 1410 07/21/22 2201 07/22/22 0500 07/22/22 0753  BP: 97/80 (!) 118/95    Pulse: 84 95    Resp: 19 16    Temp: 99 F (37.2 C) 97.8 F (36.6 C)    TempSrc:      SpO2: 100% 98%  97%  Weight:   70.3 kg   Height:        Intake/Output Summary (Last 24 hours) at 07/22/2022 0901 Last data filed at 07/22/2022 0600 Gross per 24 hour  Intake 1358.57 ml  Output 2775 ml  Net -1416.43 ml      07/22/2022    5:00 AM 07/21/2022    5:00 AM 07/20/2022    5:00 AM  Last 3 Weights  Weight (lbs) 154 lb 15.7 oz 152 lb 5.4 oz 154 lb 5.2 oz  Weight (kg) 70.3 kg 69.1 kg 70 kg      Telemetry     SR, HR 80-90s - Personally Reviewed  ECG    N/A  Physical Exam   GEN: No acute distress.   Neck: No JVD Cardiac: RRR, no murmurs, rubs, or gallops.  Respiratory: Clear to auscultation bilaterally. GI: Soft, nontender, non-distended  MS: No edema; S/p right above knee amputation Neuro:  Nonfocal  Psych: Normal affect   Labs    High Sensitivity Troponin:   Recent Labs  Lab 07/13/22 1850 07/13/22 2213 07/18/22 1248  TROPONINIHS 45* 38* 18*     Chemistry Recent Labs  Lab 07/19/22 0522 07/20/22 0529 07/21/22 0712 07/22/22 0500  NA 140 139 136 138  K 3.7 3.6 3.3* 3.6  CL 107 106 101 103  CO2 26 28 28 29   GLUCOSE 164* 95 114* 125*  BUN 12 11 7 9   CREATININE 0.79 0.78 0.77 0.90  CALCIUM 8.2* 8.2* 8.0* 8.2*  MG 2.0 1.8 1.7 1.7  PROT 6.5  --   --   --   ALBUMIN 2.5*  --   --   --   AST 15  --   --   --  ALT 12  --   --   --   ALKPHOS 73  --   --   --   BILITOT 0.5  --   --   --   GFRNONAA >60 >60 >60 >60  ANIONGAP 7 5 7 6     Lipids No results for input(s): "CHOL", "TRIG", "HDL", "LABVLDL", "LDLCALC", "CHOLHDL" in the last 168 hours.  Hematology Recent Labs  Lab 07/16/22 0357 07/18/22 1248 07/19/22 0522  WBC 10.0 7.4 7.1  RBC 3.89* 3.71* 3.59*  HGB 11.5* 10.9* 10.5*  HCT 36.5* 34.0* 33.5*  MCV 93.8 91.6 93.3  MCH 29.6 29.4 29.2  MCHC 31.5 32.1 31.3  RDW 17.7* 17.2* 17.3*  PLT 267 293 302   Thyroid No results for input(s): "TSH", "FREET4" in the last 168 hours.  BNP Recent Labs  Lab 07/22/22 0500  BNP 611.6*    DDimer No results for input(s): "DDIMER" in the last 168 hours.   Radiology    DG CHEST PORT 1 VIEW  Result Date: 07/22/2022 CLINICAL DATA:  Worsening dyspnea over the last few days. History of congestive heart failure. EXAM: PORTABLE CHEST 1 VIEW COMPARISON:  Radiographs 07/13/2022 and 07/12/2022.  CT 03/28/2022. FINDINGS: 0828 hours. Interval extubation and removal of the enteric tube. A left subclavian central venous catheter projects  to the level of the confluence of the brachiocephalic veins, unchanged. Mild patient rotation to the right. Allowing for this, the heart size and mediastinal contours are stable. However, there is mild volume loss at the right lung base associated with increased right basilar airspace disease, primarily peripherally in the middle lobe. No significant pleural effusion. The left lung is clear. There is no pneumothorax or acute osseous abnormality. Telemetry leads overlie the chest. IMPRESSION: 1. Progressive right basilar airspace disease with associated increased volume loss, most consistent with pneumonia. Correlate clinically. Recommend radiographic follow-up to document clearing and exclude underlying malignancy. 2. Unchanged position of left subclavian central venous catheter. Electronically Signed   By: 03/30/2022 M.D.   On: 07/22/2022 08:43    Cardiac Studies   Echocardiogram 07/14/2022:   Impressions:  1. Left ventricular ejection fraction, by estimation, is <20%. Left  ventricular ejection fraction by 3D volume is 20 %. The left ventricle has  severely decreased function. The left ventricle demonstrates global  hypokinesis. The left ventricular internal   cavity size was moderately dilated. There is mild left ventricular  hypertrophy. Left ventricular diastolic parameters are consistent with  Grade II diastolic dysfunction (pseudonormalization). Elevated left  ventricular end-diastolic pressure. The E/e' is   24.   2. Right ventricular systolic function is moderately reduced. The right  ventricular size is mildly enlarged. There is normal pulmonary artery  systolic pressure. The estimated right ventricular systolic pressure is  26.7 mmHg.   3. The mitral valve is abnormal. Trivial mitral valve regurgitation.   4. The aortic valve is tricuspid. Aortic valve regurgitation is not  visualized.   5. The inferior vena cava is normal in size with <50% respiratory  variability, suggesting  right atrial pressure of 8 mmHg.   Comparison(s): No significant change from prior study. 01/30/2022: LVEF  <20%, grade 3 DD, elevated LV filling pressure.   Patient Profile     56 y.o. 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 He has had multiple admission over the last 8 months for a variety of complaints including acute on chronic CHF. He was admitted on 07/12/2022 for acute hypoxic respiratory failure secondary to acute on chronic CHF, COPD exacerbation, and pneumonia after presenting with shortness of breath. He reported being non-compliant with his home Lasix. He ultimately required intubation on 07/13/2022 due to rising O2 requirement and refusing BiPAP due to agitation. He was able to be extubated on 07/16/2022.  Assessment & Plan    Acute on Chronic Combined CHF: BNP elevated at 2,089. Chest x-ray showed findings concerning for pneumonia but no overt edema. Echo showed LVEF of <20% with global hypokinesis, grade 2 diastolic dysfunction, and elevated LVEDP. RV mildly enlarged with moderately reduced RV systolic function and normal PASP.  Required intubation on admission, now extubated and on 4 L [place].  He has been diuresed with IV Lasix.  - Net negative 8L this admission. - Weight 183>>>155lb - Not on GDMT due to non compliance >>> titrate/add once followed up in clinic - Continue coreg - BNP improved from admit but up at 611 - Give IV lasix this morning >> change to PO per MD   CAD: Prior cath at [facility] in 2020 showed moderate non-obstructive CAD. Coronary CTA in 03/2020 showed moderate to severe disease of RCA, LAD, and LCX. However, not felt to  be candidate for invasive ischemic work-up due to noncompliance and cocaine abuse. High-sensitivity troponin minimally elevated and flat at 45 >> 38 consistent with demand ischemia and prior trends.  - He reports intermittent sharp chest pain that last for a couple of minutes and then resolves. This is a long standing issue. He was having this during his admission in 03/2022 as well. EKG, troponin unremarkable - No aspirin due to need for Eliquis. - Not on a statin here or at home. Given significant non-compliance, don't think there is much utility in trying to restart this. We will focus on diuretic and GDMT that will help keep fluid off and keep him out of the hospital. - Plan for outpatient cMRI for viability if compliant >> recommended social work support   Sinus Bradycardia - Resolved Secondary to Precedex drip which has now been stopped. Heart rate have now improved are in the 80s to low 100s. - Continue to monitor.   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 - On Eliquis 5mg  twice daily. This was initially held due to coffee ground drainage from OT but has now been resumed.   Hypertension BP soft at times but currently stable. Can add GDMT as tolerated, though he is refusing most meds  Consider given medications from [place] at discharge. Will need CM and SW support.   For questions or updates, please contact [place] Please consult [email or web address] for contact info under        SignedLeanor Kail, PA  07/22/2022, 9:01 AM     Personally seen and examined. Agree with APP above with the following comments:   Briefly 56 yo M with a history of HFrEF EV < 123456, CAD 3VD, complicated by Cocaine mis-use and lack of being homed; complicated by chronic pain.    He notes that he was the reason that [facility] changed their name to [facility].  He worked with [employer] in the past and would like to  go back  there.   R BKA site intact  CTAB.  No edema. He notes that he has residual R arm weakness from prior stroke  Tele: SR with improved PVCs   Would recommend  - Would transition to PO lasix today 40 mg PO BID (was on Daily) - continue coreg 3.125 mg PO BID; ectopy has improved on this regimen - will defer other GDMT to outpatient - if he can gets social work support;and stays of cocaine, we could work on more aggressive therapy - repeat EKG today for Qtc baseline - he has outpatient follow up 08/06/22  We have talked at length about his social circumstance and the difficulties of being chronically un-homed.  Our hope he is can get a facility and get further resources, stay off cocaine, and have long term meaningful recovery.   [facility] will sign off.   Medication Recommendations:  lasix 40 mg PO BID Other recommendations (labs, testing, etc):  EKG today Follow up as an outpatient:  08/06/22  Will chart check tomorrow; if doing well will sign off.  Riley Lam, MD FASE Cardiologist [facility]  [address] [place], [address] [place], [name] [address] [phone]  12:26 PM

## 2022-07-22 NOTE — [place] FL2 (Signed)
[place] MEDICAID FL2 LEVEL OF CARE SCREENING TOOL     IDENTIFICATION  Patient Name: [name] Birthdate: 22-Nov-1966 Sex: male Admission Date (Current Location): 07/12/2022  [place] and [place] Number:  Haynes Bast [id number] Facility and Address:  [facility],  [address] [place] [address]      Provider Number: [id number]  Attending Physician Name and Address:  Leatha Gilding, MD  Relative Name and Phone Number:       Current Level of Care: Hospital Recommended Level of Care: Skilled Nursing Facility Prior Approval Number:    Date Approved/Denied:   PASRR Number: pending  Discharge Plan: SNF    Current Diagnoses: Patient Active Problem List   Diagnosis Date Noted   Pressure injury of skin 07/13/2022   Acute on chronic systolic (congestive) heart failure (HCC) 07/13/2022   Acute on chronic systolic congestive heart failure (HCC) 07/12/2022   History of DVT (deep vein thrombosis) 07/12/2022   CAP (community acquired pneumonia) 07/12/2022   Chronic venous hypertension (idiopathic) with ulcer and inflammation of bilateral lower extremity (HCC)    Sepsis due to cellulitis (HCC) 04/04/2022   Normocytic anemia 04/04/2022   Acute deep vein thrombosis (DVT) of brachial vein (HCC) 03/31/2022   Swelling of lower extremity 03/23/2022   SIRS (systemic inflammatory response syndrome) (HCC) 03/23/2022   Leg swelling 03/23/2022   Acute on chronic systolic heart failure (HCC) 02/26/2022   Seizure (HCC) 02/26/2022   Venous stasis ulcer of right ankle limited to breakdown of skin with varicose veins (HCC)    Hypertensive urgency 01/29/2022   Prolonged QT interval 01/29/2022   Alcohol abuse 01/29/2022   chronic ulcers 01/29/2022   Wound infection 12/04/2021   Open wounds involving multiple regions of lower extremity 12/03/2021   Mixed diabetic hyperlipidemia associated with type 2 diabetes mellitus (HCC) 12/03/2021   Cellulitis of right lower extremity  11/13/2021   Type 2 diabetes mellitus with diabetic polyneuropathy, without long-term current use of insulin (HCC) 11/13/2021   Antisocial personality disorder (HCC) 11/04/2021   Malingering 11/04/2021   Foot pain 04/23/2021   Chronic combined systolic and diastolic congestive heart failure (HCC) 03/02/2021   History of stroke 03/02/2021   Polysubstance abuse (HCC) 03/02/2021   Skin ulcer of right great toe with necrosis of bone (HCC)    Encounter for assessment of healthcare decision-making capacity    Pain, dental    Dystonia    Dysarthria    Shock liver 01/04/2021   Acute metabolic encephalopathy 01/04/2021   Coronary artery disease involving native coronary artery of native heart without angina pectoris 01/04/2021   Bipolar I disorder (HCC) 01/04/2021   Cocaine abuse (HCC) 01/04/2021   Homelessness 01/04/2021   HLD (hyperlipidemia) 01/04/2021   Osteomyelitis of great toe (HCC)    Ulcer of great toe, left, with necrosis of bone (HCC)    Ulcer of great toe, right, with necrosis of muscle (HCC)    Acute osteomyelitis of toe, left (HCC)    Acute on chronic combined systolic and diastolic CHF (congestive heart failure) (HCC) 12/20/2020   Respiratory failure (HCC)    Ischemic stroke (HCC) 12/17/2020   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   Diabetic foot ulcer (HCC) 12/08/2020   CHF (congestive heart failure) (HCC) 11/14/2020   CHF exacerbation (HCC) 11/13/2020   Hypokalemia    Hyperlipidemia    Acute hypoxemic respiratory failure (HCC) 10/26/2020   COPD with acute exacerbation (HCC) 10/14/2020   Acute on chronic combined systolic and diastolic CHF (congestive heart failure) (  HCC) 10/02/2020   Heart failure with reduced ejection fraction and diastolic dysfunction (HCC) 09/07/2020   Near syncope 07/21/2020   Acid reflux 06/03/2020   Cannabis abuse 03/19/2020   Acute bronchitis due to Rhinovirus 09/08/2019   Schizoaffective disorder (HCC) 06/24/2019   SOB (shortness of breath)     QT prolongation 05/25/2019   Cocaine abuse (HCC)    Chest pain of uncertain etiology 05/24/2019   Elevated troponin    Homeless    Suicidal ideation    Bilateral lower extremity pain 04/05/2019   Housing situation unstable 03/28/2019   Bilateral lower extremity edema    Acute exacerbation of CHF (congestive heart failure) (HCC) 03/04/2019   Alcohol withdrawal syndrome, with delirium (HCC) 05/03/2018   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   Mild intermittent asthma without complication 08/28/2017   Asthma 12/26/2016   Tobacco use disorder 12/26/2016   Schizophrenia (HCC) 12/26/2016   Severe episode of recurrent major depressive disorder, with psychotic features (HCC) 10/31/2016   Cocaine abuse with cocaine-induced mood disorder (HCC) 09/06/2016   Alcohol use disorder, mild, abuse 07/31/2016   Substance induced mood disorder (HCC) 07/30/2016   Schizoaffective disorder, bipolar type (HCC) 05/31/2016   Cocaine use disorder, severe, dependence (HCC) 05/31/2016   Major depressive disorder, recurrent episode (HCC) 05/30/2016   Suicidal behavior with attempted self-injury (HCC) 05/08/2016   Acetaminophen overdose 05/06/2016    Orientation RESPIRATION BLADDER Height & Weight     Self, Time, Place, Situation  Normal Continent Weight: 70.3 kg Height:  5\' 9"  (175.3 cm)  BEHAVIORAL SYMPTOMS/MOOD NEUROLOGICAL BOWEL NUTRITION STATUS      Continent Diet (regular)  AMBULATORY STATUS COMMUNICATION OF NEEDS Skin   Extensive Assist Verbally Normal                       Personal Care Assistance Level of Assistance  Bathing, Feeding, Dressing Bathing Assistance: Independent Feeding assistance: Independent Dressing Assistance: Independent     Functional Limitations Info  Sight, Hearing, Speech Sight Info: Adequate Hearing Info: Adequate Speech Info: Adequate    SPECIAL CARE FACTORS FREQUENCY  OT (By licensed OT), PT (By licensed PT)     PT Frequency:  5 x weekly OT Frequency: 5 x weekly            Contractures      Additional Factors Info  Code Status Code Status Info: full             Current Medications (07/22/2022):  This is the current hospital active medication list Current Facility-Administered Medications  Medication Dose Route Frequency Provider Last Rate Last Admin   apixaban (ELIQUIS) tablet 5 mg  5 mg Oral BID 07/24/2022, NP   5 mg at 07/21/22 2149   ARIPiprazole (ABILIFY) tablet 5 mg  5 mg Oral Daily Bowser, 2150, NP   5 mg at 07/21/22 0920   budesonide (PULMICORT) nebulizer solution 0.5 mg  0.5 mg Nebulization Q12H Amin, Ankit Chirag, MD   0.5 mg at 07/22/22 0752   carvedilol (COREG) tablet 3.125 mg  3.125 mg Oral BID WC 07/24/22, NP   3.125 mg at 07/21/22 1634   Chlorhexidine Gluconate Cloth 2 % PADS 6 each  6 each Topical Daily Amin, Ankit Chirag, MD   6 each at 07/20/22 1022   clonazepam (KLONOPIN) disintegrating tablet 0.5 mg  0.5 mg Oral BID 07/22/22, NP   0.5 mg at 07/21/22 2149   dextrose 10 % infusion  Intravenous Continuous Lanier Clam, NP 10 mL/hr at 07/18/22 1000 Infusion Verify at 07/18/22 1000   docusate sodium (COLACE) capsule 100 mg  100 mg Oral BID Olalere, Adewale A, MD       feeding supplement (VITAL 1.5 CAL) liquid 1,000 mL  1,000 mL Per Tube Continuous Olalere, Adewale A, MD       folic acid (FOLVITE) tablet 1 mg  1 mg Oral Daily Norva Pavlov, RPH       furosemide (LASIX) injection 40 mg  40 mg Intravenous BID Little Ishikawa, MD   40 mg at 07/21/22 0626   gabapentin (NEURONTIN) capsule 400 mg  400 mg Oral TID Lanier Clam, NP   400 mg at 07/21/22 2149   guaiFENesin (ROBITUSSIN) 100 MG/5ML liquid 5 mL  5 mL Oral Q4H PRN Amin, Ankit Chirag, MD   5 mL at 07/21/22 1635   hydrALAZINE (APRESOLINE) injection 10 mg  10 mg Intravenous Q4H PRN Amin, Ankit Chirag, MD       ipratropium-albuterol (DUONEB) 0.5-2.5 (3) MG/3ML nebulizer solution 3 mL  3 mL Nebulization  Q4H PRN Amin, Ankit Chirag, MD   3 mL at 07/18/22 0611   ipratropium-albuterol (DUONEB) 0.5-2.5 (3) MG/3ML nebulizer solution 3 mL  3 mL Nebulization BID Leatha Gilding, MD   3 mL at 07/22/22 0752   LORazepam (ATIVAN) injection 1-2 mg  1-2 mg Intravenous Q2H PRN Oretha Milch, MD       magnesium oxide (MAG-OX) tablet 400 mg  400 mg Oral Daily Little Ishikawa, MD   400 mg at 07/21/22 9485   metoprolol tartrate (LOPRESSOR) injection 5 mg  5 mg Intravenous Q4H PRN Amin, Ankit Chirag, MD       multivitamin with minerals tablet 1 tablet  1 tablet Oral Daily Bowser, Kaylyn Layer, NP   1 tablet at 07/21/22 0920   Oral care mouth rinse  15 mL Mouth Rinse 4 times per day Olalere, Adewale A, MD       Oral care mouth rinse  15 mL Mouth Rinse PRN Olalere, Adewale A, MD       oxyCODONE (Oxy IR/ROXICODONE) immediate release tablet 10-15 mg  10-15 mg Oral Q6H PRN Leatha Gilding, MD   15 mg at 07/22/22 0703   pantoprazole (PROTONIX) EC tablet 40 mg  40 mg Oral BID Norva Pavlov, RPH   40 mg at 07/21/22 2149   polyethylene glycol (MIRALAX / GLYCOLAX) packet 17 g  17 g Oral Daily Bowser, Delorise Shiner E, NP   17 g at 07/18/22 1001   potassium chloride SA (KLOR-CON M) CR tablet 40 mEq  40 mEq Oral Daily Little Ishikawa, MD   40 mEq at 07/21/22 4627   prochlorperazine (COMPAZINE) injection 5 mg  5 mg Intravenous Q4H PRN Bobette Mo, MD       sodium chloride flush (NS) 0.9 % injection 10-40 mL  10-40 mL Intracatheter PRN Leatha Gilding, MD   10 mL at 07/19/22 0523   thiamine (VITAMIN B1) tablet 100 mg  100 mg Oral Daily Norva Pavlov, RPH       traZODone (DESYREL) tablet 50 mg  50 mg Oral QHS PRN Lanier Clam, NP   50 mg at 07/20/22 2026   white petrolatum (VASELINE) gel   Topical PRN Dimple Nanas, MD         Discharge Medications: Please see discharge summary for a list of discharge medications.  Relevant Imaging Results:  Relevant Lab Results:   Additional  Information SSN [id number]  Golda Acre, RN

## 2022-07-22 NOTE — Plan of Care (Signed)
  Problem: Clinical Measurements: Goal: Respiratory complications will improve Outcome: Progressing Goal: Cardiovascular complication will be avoided Outcome: Progressing   Problem: Nutrition: Goal: Adequate nutrition will be maintained Outcome: Progressing   Problem: Health Behavior/Discharge Planning: Goal: Ability to manage health-related needs will improve Outcome: Not Progressing Difficulty understanding need for health behavioral needs. Pt was agreeable to bath this afternoon   Problem: Activity: Goal: Risk for activity intolerance will decrease Outcome: Not Progressing RLE AKA, LLE severely impaired

## 2022-07-22 NOTE — TOC Progression Note (Signed)
Transition of Care [facility]) - Progression Note    Patient Details  Name: [name] MRN: [id number] Date of Birth: 1966-10-19  [facility] of [facility] CM/SW Contact  Golda Acre, RN Phone Number: 07/22/2022, 10:15 AM  Clinical Narrative:    Information reuqested by passar except the fl2-must be signed-submitted to passar.   Expected Discharge Plan: Homeless Shelter Barriers to Discharge: Continued Medical Work up  Expected Discharge Plan and Services Expected Discharge Plan: Homeless Shelter   Discharge Planning Services: CM Consult Post Acute Care Choice: NA (Patient had walker and wheelchair given to him and is not eligible for any DME for 5 years.) Living arrangements for the past 2 months: Homeless Shelter, Hotel/Motel                   DME Agency: NA       HH Arranged: NA HH Agency: NA         Social Determinants of Health (SDOH) Interventions    Readmission Risk Interventions    07/18/2022    4:11 PM 04/08/2022    1:43 PM 03/25/2022    5:39 PM  Readmission Risk Prevention Plan  Transportation Screening Complete Complete Complete  Medication Review Oceanographer) Complete Complete Referral to Pharmacy  PCP or Specialist appointment within 3-5 days of discharge Complete Complete Complete  HRI or Home Care Consult Complete Not Complete Not Complete  HRI or Home Care Consult Pt Refusal Comments  Pt is homeless Patient is homeless  SW Recovery Care/Counseling Consult Complete Complete Complete  Palliative Care Screening  Not Applicable Not Applicable  Skilled Nursing Facility Complete Not Applicable Complete

## 2022-07-22 NOTE — Progress Notes (Signed)
PROGRESS NOTE  [name] [id number] DOB: 27-Nov-1966 DOA: 07/12/2022 PCP: Marliss Coots, NP   LOS: 9 days   Brief Narrative / Interim history: 56yM with history of asthma, bipolar 1 disorder, depression, schizoaffective disorder, chronic congestive heart failure, homelessness, GERD, hypertension, QT prolongation, type 2 diabetes, alcohol abuse, cocaine and cannabis abuse, tobacco use, chronic combined systolic and diastolic heart failure, right AKA who presented to ED with gradually worsening dyspnea, LE edema, chest pressure and productive cough.  Hospital course complicated by ventilator dependent respiratory failure, eventually being extubated on 8/16.  Hospital course also complicated by persistent agitation requiring Precedex drip, causing bradycardia and eventually discontinued.  He was transferred to the hospitalist service on 8/18, and getting diuresed due to persistent fluid overload.  He is intermittently refusing labs and events  Significant Hospital Events:  8/12 admitted started on ceftriaxone, doxy, diuresed 8/13 increasing O2 requirement to 8L, refusing bipap, agitation requiring a couple CIWA based doses of ativan 8/14 Some issues with agitation overnight, currently on Fentanyl and Versed drips 8/15 Issues with hypoglycemia and mild hypotension overnight, resolved with IV hydration 8/16 No issues overnight, now off versed drip. Extubated  8/18 Transfer to hospitalist service  Subjective / 24h Interval events: Remains short of breath this morning.  Lots of complaints today regarding his wheelchair, it is manual and he no longer is able to push it with his hands.  He wants an Arboriculturist and Plan: Principal Problem:   Acute on chronic systolic congestive heart failure (HCC) Active Problems:   COPD with acute exacerbation (HCC)   Major depressive disorder, recurrent episode (HCC)   Alcohol use disorder, mild, abuse   Homelessness    Schizoaffective disorder, bipolar type ([place])   Chest pain of uncertain etiology   Acute hypoxemic respiratory failure (HCC)   Hyperlipidemia   Polysubstance abuse ([place])   Prolonged QT interval   History of DVT (deep vein thrombosis)   CAP (community acquired pneumonia)   Pressure injury of skin   Acute on chronic systolic (congestive) heart failure (HCC)  Principal problem Acute hypoxic respiratory failure due to acute on chronic combined CHF, possible asthma/COPD overlap-PCCM initially consulted, he was admitted to the ICU and had to be intubated.  Eventually extubated 8/16.  He also has completed antibiotics with ceftriaxone and doxycycline, received 5 days with end date 8/16.  Still remains fluid overloaded, continue IV Lasix  Active problems Acute on chronic combined CHF-2D echo showed LVEF less than 20% with global hypokinesis, grade 2 diastolic dysfunction.  Cardiology consulted and following.  Continue IV Lasix noncompliance has been the main issue and also intermittently refusing labs and medications in the hospital, and also he is homeless and continues to abuse cocaine.  Not a candidate for aggressive advanced interventions due to polysubstance use and noncompliance.  Coronary artery disease-most recent cath at [facility] in 2020 showed moderate nonobstructive CAD, coronary CTA in 2021 showed moderate to severe RCA disease, LAD and LCx.  Felt not to be a candidate for invasive ischemic work-up due to noncompliance and cocaine abuse.  Troponins are flat, likely representing demand ischemia  Sinus bradycardia-due to Precedex, now discontinued.  Sinus bradycardia resolved  Acute metabolic encephalopathy-likely related to ICU delirium, underlying polysubstance use disorder.  He is back to baseline  Left lower extremity wounds-they do not appear infected, continue pain control and local wound care.  Had an ABI earlier this year which looked fairly good  Right middle lobe opacity, possible  pneumonia-completed  ceftriaxone and doxycycline  Asthma/COPD overlap-pulmonary hygiene, I-S, mobility  History of DVT-continue Eliquis, he is on this chronically  Status post right AKA, left lower extremity wound -wound care consulted  Anemia of chronic disease-intermittently refusing labs.  Hemoglobin overall stable  Homelessness-TOC consult  Scheduled Meds:  apixaban  5 mg Oral BID   ARIPiprazole  5 mg Oral Daily   budesonide (PULMICORT) nebulizer solution  0.5 mg Nebulization Q12H   carvedilol  3.125 mg Oral BID WC   Chlorhexidine Gluconate Cloth  6 each Topical Daily   clonazePAM  0.5 mg Oral BID   docusate sodium  100 mg Oral BID   folic acid  1 mg Oral Daily   furosemide  40 mg Intravenous BID   gabapentin  400 mg Oral TID   ipratropium-albuterol  3 mL Nebulization BID   magnesium oxide  400 mg Oral Daily   multivitamin with minerals  1 tablet Oral Daily   mouth rinse  15 mL Mouth Rinse 4 times per day   pantoprazole  40 mg Oral BID   polyethylene glycol  17 g Oral Daily   potassium chloride  40 mEq Oral Daily   thiamine  100 mg Oral Daily   Continuous Infusions:  dextrose 10 mL/hr at 07/18/22 1000   feeding supplement (VITAL 1.5 CAL)     PRN Meds:.guaiFENesin, hydrALAZINE, ipratropium-albuterol, LORazepam, metoprolol tartrate, mouth rinse, oxyCODONE, prochlorperazine, sodium chloride flush, traZODone, white petrolatum  Diet Orders (From admission, onward)     Start     Ordered   07/20/22 0949  Diet regular Room service appropriate? Yes; Fluid consistency: Thin  Diet effective now       Comments: Please allow double portions  Question Answer Comment  Room service appropriate? Yes   Fluid consistency: Thin      07/20/22 0948            DVT prophylaxis:  apixaban (ELIQUIS) tablet 5 mg   Lab Results  Component Value Date   PLT 302 07/19/2022      Code Status: Full Code  Family Communication: no family at bedside   Status is: Inpatient Remains  inpatient appropriate because: severity of illness   Level of care: Progressive  Consultants:  Cards  PCCM  Objective: Vitals:   07/21/22 2201 07/22/22 0500 07/22/22 0753 07/22/22 1113  BP: (!) 118/95   127/66  Pulse: 95   88  Resp: 16   18  Temp: 97.8 F (36.6 C)   98.3 F (36.8 C)  TempSrc:      SpO2: 98%  97% 100%  Weight:  70.3 kg    Height:        Intake/Output Summary (Last 24 hours) at 07/22/2022 1125 Last data filed at 07/22/2022 0600 Gross per 24 hour  Intake 1118.57 ml  Output 1275 ml  Net -156.43 ml    Wt Readings from Last 3 Encounters:  07/22/22 70.3 kg  05/19/22 83.1 kg  04/05/22 83.1 kg    Examination:  Constitutional: NAD Eyes: lids and conjunctivae normal, no scleral icterus ENMT: mmm Neck: normal, supple Respiratory: Increased respiratory effort, no wheezing.  Tachypneic at times Cardiovascular: Regular rate and rhythm, no murmurs / rubs / gallops. No LE edema. Abdomen: soft, no distention, no tenderness. Bowel sounds positive.  Skin: no rashes Neurologic: no focal deficits, equal strength    Data Reviewed: I have independently reviewed following labs and imaging studies   CBC Recent Labs  Lab 07/16/22 0357 07/18/22 1248 07/19/22 0522  WBC 10.0 7.4 7.1  HGB 11.5* 10.9* 10.5*  HCT 36.5* 34.0* 33.5*  PLT 267 293 302  MCV 93.8 91.6 93.3  MCH 29.6 29.4 29.2  MCHC 31.5 32.1 31.3  RDW 17.7* 17.2* 17.3*     Recent Labs  Lab 07/18/22 1248 07/18/22 2102 07/19/22 0522 07/20/22 0529 07/21/22 0712 07/22/22 0500  NA 137  --  140 139 136 138  K 3.6  --  3.7 3.6 3.3* 3.6  CL 106  --  107 106 101 103  CO2 27  --  26 28 28 29   GLUCOSE 118*  --  164* 95 114* 125*  BUN 12  --  12 11 7 9   CREATININE 0.79  --  0.79 0.78 0.77 0.90  CALCIUM 8.0*  --  8.2* 8.2* 8.0* 8.2*  AST  --   --  15  --   --   --   ALT  --   --  12  --   --   --   ALKPHOS  --   --  73  --   --   --   BILITOT  --   --  0.5  --   --   --   ALBUMIN  --   --  2.5*   --   --   --   MG 1.9 2.1 2.0 1.8 1.7 1.7  BNP  --   --   --   --   --  611.6*     ------------------------------------------------------------------------------------------------------------------ No results for input(s): "CHOL", "HDL", "LDLCALC", "TRIG", "CHOLHDL", "LDLDIRECT" in the last 72 hours.  Lab Results  Component Value Date   HGBA1C 7.0 (H) 03/24/2022   ------------------------------------------------------------------------------------------------------------------ No results for input(s): "TSH", "T4TOTAL", "T3FREE", "THYROIDAB" in the last 72 hours.  Invalid input(s): "FREET3"  Cardiac Enzymes No results for input(s): "CKMB", "TROPONINI", "MYOGLOBIN" in the last 168 hours.  Invalid input(s): "CK" ------------------------------------------------------------------------------------------------------------------    Component Value Date/Time   BNP 611.6 (H) 07/22/2022 0500    CBG: Recent Labs  Lab 07/21/22 1959 07/21/22 2358 07/22/22 0403 07/22/22 0753 07/22/22 1110  GLUCAP 190* 176* 137* 214* 139*     Recent Results (from the past 240 hour(s))  MRSA Next Gen by PCR, Nasal     Status: None   Collection Time: 07/13/22  8:55 PM   Specimen: Nasal Mucosa; Nasal Swab  Result Value Ref Range Status   MRSA by PCR Next Gen NOT DETECTED NOT DETECTED Final    Comment: (NOTE) The GeneXpert MRSA Assay (FDA approved for NASAL specimens only), is one component of a comprehensive MRSA colonization surveillance program. It is not intended to diagnose MRSA infection nor to guide or monitor treatment for MRSA infections. Test performance is not FDA approved in patients less than 32 years old. Performed at [facility], [place] [address] [place], [place] [address]   Culture, Respiratory w Gram Stain     Status: None   Collection Time: 07/13/22 11:41 PM   Specimen: Tracheal Aspirate; Respiratory  Result Value Ref Range Status   Specimen Description    Final    TRACHEAL ASPIRATE Performed at [facility] [address], [place], [place] [address]    Special Requests   Final    NONE Performed at [facility], [place] [address] [place], [place] [address]    Gram Stain   Final    NO ORGANISMS SEEN RARE WBC PRESENT, PREDOMINANTLY PMN RARE SQUAMOUS EPITHELIAL CELLS PRESENT  Culture   Final    RARE Normal respiratory flora-no Staph aureus or Pseudomonas seen Performed at [facility], [address], [place], [name] [address]    Report Status 07/16/2022 FINAL  Final     Radiology Studies: DG CHEST PORT 1 VIEW  Result Date: 07/22/2022 CLINICAL DATA:  Worsening dyspnea over the last few days. History of congestive heart failure. EXAM: PORTABLE CHEST 1 VIEW COMPARISON:  Radiographs 07/13/2022 and 07/12/2022.  CT 03/28/2022. FINDINGS: 0828 hours. Interval extubation and removal of the enteric tube. A left subclavian central venous catheter projects to the level of the confluence of the brachiocephalic veins, unchanged. Mild patient rotation to the right. Allowing for this, the heart size and mediastinal contours are stable. However, there is mild volume loss at the right lung base associated with increased right basilar airspace disease, primarily peripherally in the middle lobe. No significant pleural effusion. The left lung is clear. There is no pneumothorax or acute osseous abnormality. Telemetry leads overlie the chest. IMPRESSION: 1. Progressive right basilar airspace disease with associated increased volume loss, most consistent with pneumonia. Correlate clinically. Recommend radiographic follow-up to document clearing and exclude underlying malignancy. 2. Unchanged position of left subclavian central venous catheter. Electronically Signed   By: Carey Bullocks M.D.   On: 07/22/2022 08:43     Pamella Pert, MD, PhD [facility]  Between 7 am - 7 pm I am available, please contact me via  Amion (for emergencies) or Securechat (non urgent messages)  Between 7 pm - 7 am I am not available, please contact night coverage MD/APP via Amion

## 2022-07-23 DIAGNOSIS — G9341 Metabolic encephalopathy: Secondary | ICD-10-CM | POA: Diagnosis not present

## 2022-07-23 DIAGNOSIS — F1010 Alcohol abuse, uncomplicated: Secondary | ICD-10-CM

## 2022-07-23 DIAGNOSIS — I2510 Atherosclerotic heart disease of native coronary artery without angina pectoris: Secondary | ICD-10-CM

## 2022-07-23 DIAGNOSIS — Z89611 Acquired absence of right leg above knee: Secondary | ICD-10-CM

## 2022-07-23 DIAGNOSIS — I5023 Acute on chronic systolic (congestive) heart failure: Secondary | ICD-10-CM | POA: Diagnosis not present

## 2022-07-23 DIAGNOSIS — I83009 Varicose veins of unspecified lower extremity with ulcer of unspecified site: Secondary | ICD-10-CM

## 2022-07-23 DIAGNOSIS — J9601 Acute respiratory failure with hypoxia: Secondary | ICD-10-CM | POA: Diagnosis not present

## 2022-07-23 DIAGNOSIS — L97909 Non-pressure chronic ulcer of unspecified part of unspecified lower leg with unspecified severity: Secondary | ICD-10-CM

## 2022-07-23 DIAGNOSIS — R001 Bradycardia, unspecified: Secondary | ICD-10-CM

## 2022-07-23 DIAGNOSIS — D638 Anemia in other chronic diseases classified elsewhere: Secondary | ICD-10-CM

## 2022-07-23 DIAGNOSIS — J441 Chronic obstructive pulmonary disease with (acute) exacerbation: Secondary | ICD-10-CM

## 2022-07-23 LAB — CBC
HCT: 36.4 % — ABNORMAL LOW (ref 39.0–52.0)
Hemoglobin: 11.4 g/dL — ABNORMAL LOW (ref 13.0–17.0)
MCH: 28.9 pg (ref 26.0–34.0)
MCHC: 31.3 g/dL (ref 30.0–36.0)
MCV: 92.2 fL (ref 80.0–100.0)
Platelets: 343 10*3/uL (ref 150–400)
RBC: 3.95 MIL/uL — ABNORMAL LOW (ref 4.22–5.81)
RDW: 16 % — ABNORMAL HIGH (ref 11.5–15.5)
WBC: 4.4 10*3/uL (ref 4.0–10.5)
nRBC: 0 % (ref 0.0–0.2)

## 2022-07-23 LAB — COMPREHENSIVE METABOLIC PANEL
ALT: 11 U/L (ref 0–44)
AST: 14 U/L — ABNORMAL LOW (ref 15–41)
Albumin: 2.7 g/dL — ABNORMAL LOW (ref 3.5–5.0)
Alkaline Phosphatase: 70 U/L (ref 38–126)
Anion gap: 7 (ref 5–15)
BUN: 11 mg/dL (ref 6–20)
CO2: 30 mmol/L (ref 22–32)
Calcium: 8.5 mg/dL — ABNORMAL LOW (ref 8.9–10.3)
Chloride: 103 mmol/L (ref 98–111)
Creatinine, Ser: 0.94 mg/dL (ref 0.61–1.24)
GFR, Estimated: >60 mL/min (ref >60)
Glucose, Bld: 99 mg/dL (ref 70–99)
Potassium: 4.2 mmol/L (ref 3.5–5.1)
Sodium: 140 mmol/L (ref 135–145)
Total Bilirubin: 0.3 mg/dL (ref 0.3–1.2)
Total Protein: 6.9 g/dL (ref 6.5–8.1)

## 2022-07-23 LAB — MAGNESIUM: Magnesium: 1.8 mg/dL (ref 1.7–2.4)

## 2022-07-23 LAB — GLUCOSE, CAPILLARY
Glucose-Capillary: 84 mg/dL (ref 70–99)
Glucose-Capillary: 134 mg/dL — ABNORMAL HIGH (ref 70–99)
Glucose-Capillary: 109 mg/dL — ABNORMAL HIGH (ref 70–99)

## 2022-07-23 MED ORDER — DIPHENHYDRAMINE HCL 25 MG PO CAPS
25.0000 mg | ORAL_CAPSULE | Freq: Four times a day (QID) | ORAL | Status: DC | PRN
Start: 2022-07-23 — End: 2022-07-25
  Administered 2022-07-23 (×2): 25 mg via ORAL
  Filled 2022-07-23 (×2): qty 1

## 2022-07-23 NOTE — Progress Notes (Signed)
Progress Note   Patient: [name] VWU:[id number] DOB: 08-29-1966 DOA: 07/12/2022     10 DOS: the patient was seen and examined on 07/23/2022 at 15:58      Brief hospital course: Mr. Popwell is a 56 y.o. M with CHF, homelessness who presented with progressive dyspnea, edema and cough, found to have CHF.  Hospitalization complicated by delirium, respiratory failure and intubation.       Assessment and Plan: * Acute on chronic systolic congestive heart failure (HCC) Appears euvolemic - Continue furosemide, carvedilol - ARB, Entresto, spironolactone, ICD not possible due to noncompliance  S/P AKA (above knee amputation), right (HCC)    Anemia of chronic disease Hgb stable, no clinical bleeding  Sinus bradycardia    Venous ulcer (HCC)    Pressure injury of skin    CAP (community acquired pneumonia) Treated with 5 days Rocephin and doxy  History of DVT (deep vein thrombosis) - Continue apixaban  Prolonged QT interval    Polysubstance abuse (HCC) None while in the hospital  Homelessness - Consult TOC  Coronary artery disease involving native coronary artery of native heart without angina pectoris - Continue BB, apixaban  Acute metabolic encephalopathy At baseline is independent, able to survive alone on the street.  During this hospitalization, developed disorientation, confusion (delirium, in the setting of respiratory failure and CHF) which has now resolved to baseline.    Acute hypoxemic respiratory failure (HCC) Still on supplemental O2.  Claims he uses 3-5L that he gets "from a friend", which I am unable to corroborate.  If true, this is bizarre and likely means he uses it sporadically, if untrue, probably is a sign of disorganized thinking (probably cognitive impairment not schizophrenia). - Consult TOC  COPD with acute exacerbation (HCC) Treated with steroids and antibiotics, no evidence of bronchospasm now.  Alcohol use disorder, mild, abuse No  signs of withdrawal  Schizoaffective disorder, bipolar type (HCC) - Continue Abilify, trazodone, Klonopin  Major depressive disorder, recurrent episode (HCC) - Continue Abilify and Klonopin and trazodone           Subjective: Patient has no chest pain, no dyspnea, no malaise, no orthopnea, no swelling     Physical Exam: Vitals:   07/23/22 0433 07/23/22 0453 07/23/22 0801 07/23/22 1223  BP: 120/85   (!) 142/94  Pulse: 84   88  Resp: 20   16  Temp: 98.8 F (37.1 C)   97.6 F (36.4 C)  TempSrc:    Oral  SpO2: 97%  98% 100%  Weight:  70 kg    Height:        Thin adult male, lying in bed, no acute distress RRR, I do not appreciate murmurs, no lower extremity edema in the existing leg Respiratory rate normal, lungs clear without rales or wheezes Still has supplemental oxygen in his nose, nursing report he requires 3 L Abdomen soft without tenderness palpation or guarding Right AKA, left ankle ulcer bandaged, no surrounding redness, drainage, edema, induration Attention normal, affect strange, judgment insight appear impaired, face symmetric, speech fluent, right arm weakness.      Data Reviewed: CBC notable for mild anemia Complete metabolic panel normal Brain natruretic peptide 611 Chest x-ray with right base opacity  Family Communication: There are none    Disposition: Status is: Inpatient Patient was admitted with CHF.  At present he is still on supplemental oxygen, and is in no feasible means to obtain his medicines or maneuver.  He is medically ready for discharge, when a  safe disposition can be arranged by social work.  At baseline the patient is homeless and does not have family or friends who can maneuver him in a manual wheelchair.  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

## 2022-07-23 NOTE — Assessment & Plan Note (Signed)
None while in the hospital

## 2022-07-23 NOTE — Assessment & Plan Note (Signed)
-   Consult TOC

## 2022-07-23 NOTE — Assessment & Plan Note (Signed)
-   Continue BB, apixaban

## 2022-07-23 NOTE — Assessment & Plan Note (Addendum)
Appears euvolemic - Continue furosemide, carvedilol - ARB, Entresto, spironolactone, ICD not possible due to noncompliance

## 2022-07-23 NOTE — Assessment & Plan Note (Signed)
-   Continue Abilify and Klonopin and trazodone

## 2022-07-23 NOTE — Assessment & Plan Note (Signed)
At baseline is independent, able to survive alone on the street.  During this hospitalization, developed disorientation, confusion (delirium, in the setting of respiratory failure and CHF) which has now resolved to baseline.

## 2022-07-23 NOTE — Progress Notes (Signed)
Patient refused his capillary blood glucose at this time.

## 2022-07-23 NOTE — Assessment & Plan Note (Signed)
Continue apixaban 

## 2022-07-23 NOTE — Assessment & Plan Note (Signed)
Hgb stable, no clinical bleeding 

## 2022-07-23 NOTE — Assessment & Plan Note (Addendum)
Treated with 5 days Rocephin and doxy

## 2022-07-23 NOTE — Assessment & Plan Note (Addendum)
Still on supplemental O2.  Claims he uses 3-5L that he gets "from a friend", which I am unable to corroborate.  If true, this is bizarre and likely means he uses it sporadically, if untrue, probably is a sign of disorganized thinking (probably cognitive impairment not schizophrenia). - Consult TOC

## 2022-07-23 NOTE — Hospital Course (Signed)
[name] is a 56 y.o. M with CHF, homelessness who presented with progressive dyspnea, edema and cough, found to have CHF.

## 2022-07-23 NOTE — Assessment & Plan Note (Signed)
Treated with steroids and antibiotics, no evidence of bronchospasm now.

## 2022-07-23 NOTE — Assessment & Plan Note (Signed)
-   Continue Abilify, trazodone, Klonopin

## 2022-07-23 NOTE — TOC Progression Note (Addendum)
Transition of Care [facility]) - Progression Note    Patient Details  Name: [name] MRN: [id number] Date of Birth: [birth year]  Transition of Care [facility]) CM/SW Contact  Golda Acre, RN Phone Number: 07/23/2022, 10:21 AM  Clinical Narrative:    Spoke with patient about wheelchair.  Can not maneuver manuel wheelcahir due to aka ;eft lower leg, toe amputation on the right foot.  Congested heart failure and weakness in the upper arm.  Is homeless and does not have anyone to push him around.  Will speak with the dme vendor to see if he can qualify for one.  Spoke with Duwayne Heck with [employer] medicaid will not cover the cost of a motorized wheelchair. Will see if there is a Publishing rights manager that might have one for free, Per the medicaid site and dme supplies a motorized wheelchair is covered if medical necessary.  Order send to [facility] with adapt.  Expected Discharge Plan: Homeless Shelter Barriers to Discharge: Continued Medical Work up  Expected Discharge Plan and Services Expected Discharge Plan: Homeless Shelter   Discharge Planning Services: CM Consult Post Acute Care Choice: NA (Patient had walker and wheelchair given to him and is not eligible for any DME for 5 years.) Living arrangements for the past 2 months: Homeless Shelter, Hotel/Motel                   DME Agency: NA       HH Arranged: NA HH Agency: NA         Social Determinants of Health (SDOH) Interventions    Readmission Risk Interventions   Row Labels 07/18/2022    4:11 PM 04/08/2022    1:43 PM 03/25/2022    5:39 PM  Readmission Risk Prevention Plan   Section Header. No data exists in this row.     Transportation Screening   Complete Complete Complete  Medication Review Oceanographer)   Complete Complete Referral to Pharmacy  PCP or Specialist appointment within 3-5 days of discharge   Complete Complete Complete  HRI or Home Care Consult   Complete Not Complete Not Complete  HRI or Home Care Consult  Pt Refusal Comments    Pt is homeless Patient is homeless  SW Recovery Care/Counseling Consult   Complete Complete Complete  Palliative Care Screening    Not Applicable Not Applicable  Skilled Nursing Facility   Complete Not Applicable Complete

## 2022-07-23 NOTE — Assessment & Plan Note (Signed)
No signs of withdrawal 

## 2022-07-24 DIAGNOSIS — J9601 Acute respiratory failure with hypoxia: Secondary | ICD-10-CM | POA: Diagnosis not present

## 2022-07-24 DIAGNOSIS — F1010 Alcohol abuse, uncomplicated: Secondary | ICD-10-CM | POA: Diagnosis not present

## 2022-07-24 DIAGNOSIS — G9341 Metabolic encephalopathy: Secondary | ICD-10-CM | POA: Diagnosis not present

## 2022-07-24 DIAGNOSIS — I5023 Acute on chronic systolic (congestive) heart failure: Secondary | ICD-10-CM | POA: Diagnosis not present

## 2022-07-24 LAB — GLUCOSE, CAPILLARY
Glucose-Capillary: 138 mg/dL — ABNORMAL HIGH (ref 70–99)
Glucose-Capillary: 145 mg/dL — ABNORMAL HIGH (ref 70–99)
Glucose-Capillary: 158 mg/dL — ABNORMAL HIGH (ref 70–99)

## 2022-07-24 MED ORDER — ENSURE ENLIVE PO LIQD
237.0000 mL | Freq: Two times a day (BID) | ORAL | Status: DC
Start: 2022-07-24 — End: 2022-07-25

## 2022-07-24 NOTE — TOC Progression Note (Signed)
Transition of Care [facility]) - Progression Note    Patient Details  Name: Toby Breithaupt MRN: [id number] Date of Birth: 1966-02-04  Transition [facility]) CM/SW Contact  Golda Acre, RN Phone Number: 07/24/2022, 2:51 PM  Clinical Narrative:    [facility] at [facility] does not have short term rehab benefits associated with plan.  Does have long term but does not have any pt or ot benefits so he would be billed for this. Discussed with supervisor dianna-will await call backs and see if anyone will accept or decline.   Expected Discharge Plan: Homeless Shelter Barriers to Discharge: Continued Medical Work up  Expected Discharge Plan and Services Expected Discharge Plan: Homeless Shelter   Discharge Planning Services: CM Consult Post Acute Care Choice: NA (Patient had walker and wheelchair given to him and is not eligible for any DME for 5 years.) Living arrangements for the past 2 months: Homeless Shelter, Hotel/Motel                   DME Agency: NA       HH Arranged: NA HH Agency: NA         Social Determinants of Health (SDOH) Interventions    Readmission Risk Interventions   Row Labels 07/18/2022    4:11 PM 04/08/2022    1:43 PM 03/25/2022    5:39 PM  Readmission Risk Prevention Plan   Section Header. No data exists in this row.     Transportation Screening   Complete Complete Complete  Medication Review Oceanographer)   Complete Complete Referral to Pharmacy  PCP or Specialist appointment within 3-5 days of discharge   Complete Complete Complete  HRI or Home Care Consult   Complete Not Complete Not Complete  HRI or Home Care Consult Pt Refusal Comments    Pt is homeless Patient is homeless  SW Recovery Care/Counseling Consult   Complete Complete Complete  Palliative Care Screening    Not Applicable Not Applicable  Skilled Nursing Facility   Complete Not Applicable Complete

## 2022-07-24 NOTE — NC FL2 (Signed)
[place] MEDICAID FL2 LEVEL OF CARE SCREENING TOOL     IDENTIFICATION  Patient Name: [name] Birthdate: Dec 20, 1965 Sex: male Admission Date (Current Location): 07/12/2022  [place] and [place] Number:  Haynes Bast [id number] Facility and Address:  [facility],  [address], [place] [address]      Provider Number: [id number]  Attending Physician Name and Address:  Alberteen Sam, *  Relative Name and Phone Number:       Current Level of Care: Hospital Recommended Level of Care: Skilled Nursing Facility Prior Approval Number:    Date Approved/Denied:   PASRR Number: pending  Discharge Plan: SNF    Current Diagnoses: Patient Active Problem List   Diagnosis Date Noted   Venous ulcer (HCC) 07/23/2022   Sinus bradycardia 07/23/2022   Anemia of chronic disease 07/23/2022   S/P AKA (above knee amputation), right (HCC) 07/23/2022   Pressure injury of skin 07/13/2022   Acute on chronic systolic congestive heart failure (HCC) 07/12/2022   History of DVT (deep vein thrombosis) 07/12/2022   CAP (community acquired pneumonia) 07/12/2022   Chronic venous hypertension (idiopathic) with ulcer and inflammation of bilateral lower extremity (HCC)    Sepsis due to cellulitis (HCC) 04/04/2022   Normocytic anemia 04/04/2022   Acute deep vein thrombosis (DVT) of brachial vein (HCC) 03/31/2022   Swelling of lower extremity 03/23/2022   SIRS (systemic inflammatory response syndrome) (HCC) 03/23/2022   Leg swelling 03/23/2022   Acute on chronic systolic heart failure (HCC) 02/26/2022   Seizure (HCC) 02/26/2022   Venous stasis ulcer of right ankle limited to breakdown of skin with varicose veins (HCC)    Hypertensive urgency 01/29/2022   Prolonged QT interval 01/29/2022   Alcohol abuse 01/29/2022   chronic ulcers 01/29/2022   Wound infection 12/04/2021   Open wounds involving multiple regions of lower extremity 12/03/2021   Mixed diabetic hyperlipidemia  associated with type 2 diabetes mellitus (HCC) 12/03/2021   Cellulitis of right lower extremity 11/13/2021   Type 2 diabetes mellitus with diabetic polyneuropathy, without long-term current use of insulin (HCC) 11/13/2021   Antisocial personality disorder (HCC) 11/04/2021   Malingering 11/04/2021   Foot pain 04/23/2021   Chronic combined systolic and diastolic congestive heart failure (HCC) 03/02/2021   History of stroke 03/02/2021   Polysubstance abuse (HCC) 03/02/2021   Skin ulcer of right great toe with necrosis of bone (HCC)    Encounter for assessment of healthcare decision-making capacity    Pain, dental    Dystonia    Dysarthria    Shock liver 01/04/2021   Acute metabolic encephalopathy 01/04/2021   Coronary artery disease involving native coronary artery of native heart without angina pectoris 01/04/2021   Bipolar I disorder (HCC) 01/04/2021   Cocaine abuse (HCC) 01/04/2021   Homelessness 01/04/2021   HLD (hyperlipidemia) 01/04/2021   Osteomyelitis of great toe (HCC)    Ulcer of great toe, left, with necrosis of bone (HCC)    Ulcer of great toe, right, with necrosis of muscle (HCC)    Acute osteomyelitis of toe, left (HCC)    Acute on chronic combined systolic and diastolic CHF (congestive heart failure) (HCC) 12/20/2020   Respiratory failure (HCC)    Ischemic stroke (HCC) 12/17/2020   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   Diabetic foot ulcer (HCC) 12/08/2020   CHF (congestive heart failure) (HCC) 11/14/2020   CHF exacerbation (HCC) 11/13/2020   Hypokalemia    Hyperlipidemia    Acute hypoxemic respiratory failure (HCC) 10/26/2020   COPD with  acute exacerbation (HCC) 10/14/2020   Acute on chronic combined systolic and diastolic CHF (congestive heart failure) (HCC) 10/02/2020   Heart failure with reduced ejection fraction and diastolic dysfunction (HCC) 09/07/2020   Near syncope 07/21/2020   Acid reflux 06/03/2020   Cannabis abuse 03/19/2020   Acute bronchitis due to  Rhinovirus 09/08/2019   Schizoaffective disorder (HCC) 06/24/2019   SOB (shortness of breath)    QT prolongation 05/25/2019   Cocaine abuse (HCC)    Elevated troponin    Homeless    Suicidal ideation    Bilateral lower extremity pain 04/05/2019   Housing situation unstable 03/28/2019   Bilateral lower extremity edema    Acute exacerbation of CHF (congestive heart failure) (HCC) 03/04/2019   Alcohol withdrawal syndrome, with delirium (HCC) 05/03/2018   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   Mild intermittent asthma without complication 08/28/2017   Asthma 12/26/2016   Tobacco use disorder 12/26/2016   Schizophrenia (HCC) 12/26/2016   Severe episode of recurrent major depressive disorder, with psychotic features (HCC) 10/31/2016   Cocaine abuse with cocaine-induced mood disorder (HCC) 09/06/2016   Alcohol use disorder, mild, abuse 07/31/2016   Substance induced mood disorder (HCC) 07/30/2016   Schizoaffective disorder, bipolar type (HCC) 05/31/2016   Cocaine use disorder, severe, dependence (HCC) 05/31/2016   Major depressive disorder, recurrent episode (HCC) 05/30/2016   Suicidal behavior with attempted self-injury (HCC) 05/08/2016   Acetaminophen overdose 05/06/2016    Orientation RESPIRATION BLADDER Height & Weight     Self, Time, Place, Situation  Normal Continent Weight: 71.1 kg Height:  5\' 9"  (175.3 cm)  BEHAVIORAL SYMPTOMS/MOOD NEUROLOGICAL BOWEL NUTRITION STATUS      Continent Diet (regular)  AMBULATORY STATUS COMMUNICATION OF NEEDS Skin   Extensive Assist Verbally Normal                       Personal Care Assistance Level of Assistance  Bathing, Feeding, Dressing Bathing Assistance: Independent Feeding assistance: Independent Dressing Assistance: Independent     Functional Limitations Info  Sight, Hearing, Speech Sight Info: Adequate Hearing Info: Adequate Speech Info: Adequate    SPECIAL CARE FACTORS FREQUENCY  OT (By  licensed OT), PT (By licensed PT)     PT Frequency: 5 x weekly OT Frequency: 5 x weekly            Contractures      Additional Factors Info  Code Status Code Status Info: full             Current Medications (07/24/2022):  This is the current hospital active medication list Current Facility-Administered Medications  Medication Dose Route Frequency Provider Last Rate Last Admin   apixaban (ELIQUIS) tablet 5 mg  5 mg Oral BID 07/26/2022, NP   5 mg at 07/24/22 0843   ARIPiprazole (ABILIFY) tablet 5 mg  5 mg Oral Daily Bowser, 07/26/22, NP   5 mg at 07/24/22 0843   budesonide (PULMICORT) nebulizer solution 0.5 mg  0.5 mg Nebulization Q12H Amin, Ankit Chirag, MD   0.5 mg at 07/23/22 2032   carvedilol (COREG) tablet 3.125 mg  3.125 mg Oral BID WC 2033, NP   3.125 mg at 07/24/22 07/26/22   Chlorhexidine Gluconate Cloth 2 % PADS 6 each  6 each Topical Daily 2979, MD   6 each at 07/24/22 0848   clonazepam (KLONOPIN) disintegrating tablet 0.5 mg  0.5 mg Oral BID 07/26/22, NP   0.5 mg at  07/24/22 0843   dextrose 10 % infusion   Intravenous Continuous Bowser, Kaylyn Layer, NP 10 mL/hr at 07/23/22 0120 Infusion Verify at 07/23/22 0120   diphenhydrAMINE (BENADRYL) capsule 25 mg  25 mg Oral Q6H PRN Alberteen Sam, MD   25 mg at 07/23/22 2122   docusate sodium (COLACE) capsule 100 mg  100 mg Oral BID Olalere, Adewale A, MD       feeding supplement (VITAL 1.5 CAL) liquid 1,000 mL  1,000 mL Per Tube Continuous Olalere, Adewale A, MD       folic acid (FOLVITE) tablet 1 mg  1 mg Oral Daily Norva Pavlov, RPH       furosemide (LASIX) tablet 40 mg  40 mg Oral BID Chandrasekhar, Mahesh A, MD   40 mg at 07/24/22 0843   gabapentin (NEURONTIN) capsule 400 mg  400 mg Oral TID Lanier Clam, NP   400 mg at 07/24/22 0843   guaiFENesin (ROBITUSSIN) 100 MG/5ML liquid 5 mL  5 mL Oral Q4H PRN Amin, Ankit Chirag, MD   5 mL at 07/21/22 1635   hydrALAZINE (APRESOLINE)  injection 10 mg  10 mg Intravenous Q4H PRN Amin, Ankit Chirag, MD       ipratropium-albuterol (DUONEB) 0.5-2.5 (3) MG/3ML nebulizer solution 3 mL  3 mL Nebulization Q4H PRN Amin, Ankit Chirag, MD   3 mL at 07/18/22 0611   ipratropium-albuterol (DUONEB) 0.5-2.5 (3) MG/3ML nebulizer solution 3 mL  3 mL Nebulization BID Leatha Gilding, MD   3 mL at 07/24/22 0834   LORazepam (ATIVAN) injection 1-2 mg  1-2 mg Intravenous Q2H PRN Oretha Milch, MD       magnesium oxide (MAG-OX) tablet 400 mg  400 mg Oral Daily Little Ishikawa, MD   400 mg at 07/22/22 1148   metoprolol tartrate (LOPRESSOR) injection 5 mg  5 mg Intravenous Q4H PRN Amin, Ankit Chirag, MD       multivitamin with minerals tablet 1 tablet  1 tablet Oral Daily Bowser, Kaylyn Layer, NP   1 tablet at 07/21/22 0920   Oral care mouth rinse  15 mL Mouth Rinse 4 times per day Olalere, Adewale A, MD       Oral care mouth rinse  15 mL Mouth Rinse PRN Olalere, Adewale A, MD       oxyCODONE (Oxy IR/ROXICODONE) immediate release tablet 10-15 mg  10-15 mg Oral Q6H PRN Leatha Gilding, MD   15 mg at 07/24/22 0321   polyethylene glycol (MIRALAX / GLYCOLAX) packet 17 g  17 g Oral Daily Bowser, Grace E, NP   17 g at 07/18/22 1001   potassium chloride SA (KLOR-CON M) CR tablet 40 mEq  40 mEq Oral Daily Little Ishikawa, MD   40 mEq at 07/22/22 1149   prochlorperazine (COMPAZINE) injection 5 mg  5 mg Intravenous Q4H PRN Bobette Mo, MD       sodium chloride flush (NS) 0.9 % injection 10-40 mL  10-40 mL Intracatheter PRN Leatha Gilding, MD   10 mL at 07/19/22 0523   thiamine (VITAMIN B1) tablet 100 mg  100 mg Oral Daily Norva Pavlov, RPH       traZODone (DESYREL) tablet 50 mg  50 mg Oral QHS PRN Lanier Clam, NP   50 mg at 07/20/22 2026   white petrolatum (VASELINE) gel   Topical PRN Dimple Nanas, MD         Discharge Medications: Please see discharge summary  for a list of discharge medications.  Relevant Imaging  Results:  Relevant Lab Results:   Additional Information SSN [id number]  Golda Acre, RN

## 2022-07-24 NOTE — Progress Notes (Signed)
Patient was able to be weaned to room air with O2 sats remaining at 100%.

## 2022-07-24 NOTE — Progress Notes (Signed)
Nutrition Follow-up  DOCUMENTATION CODES:   Not applicable  INTERVENTION:  - will d/c Vital 1.5  - will order Ensure Plus High Protein BID, each supplement provides 350 kcal and 20 grams of protein.   NUTRITION DIAGNOSIS:   Increased nutrient needs related to acute illness as evidenced by estimated needs. -revised, ongoing  GOAL:   Patient will meet greater than or equal to 90% of their needs -minimally met on average  MONITOR:   PO intake, Supplement acceptance, Labs, Weight trends  ASSESSMENT:   56 year-old male with medical history of asthma, bipolar 1 disorder, depression, schizoaffective disorder, CHF, homelessness, GERD, HTN, QT prolongation, type 2 DM, alcohol abuse, cocaine and cannabis abuse, tobacco use, and R AKA. He presented to the ED due to gradually worsening dyspnea, LE edema, chest pressure, and productive cough. He was admitted due to need for abx for CAP and diuresis d/t acute on chronic CHF. He initially required 3L O2 and O2 requirement increased to 8L; patient refused BiPAP. He was ultimately intubated 8/13 evening.  Small bore NGT placed in R nare on 8/17 and was removed a few hours later. No enteral access since that time. Diet advanced to CLD on 8/17 at 1410 and to Regular on 8/18 at 0620. He has been eating 50-100% since that time.   Notes from [facility] today reviewed.   Weight has been stable over the past 5 days. Mild pitting edema to LLE documented in the edema section of flow sheet.   Labs reviewed; CBGs: 138 and 145 mg/dl, Ca: 8.5 mg/dl.  Medications reviewed; 100 mg colace BID, 1 mg folvite/day, 40 mg oral lasix BID started 8/22, 400 mg mag-ox/day, 1 tablet multivitamin with minerals/day, 17 g miralax/day, 40 mEq Klor-Con/day, 100 mg oral thiamine/day.    Diet Order:   Diet Order             Diet regular Room service appropriate? Yes; Fluid consistency: Thin  Diet effective now                   EDUCATION NEEDS:   Not  appropriate for education at this time  Skin:  Skin Assessment: Skin Integrity Issues: Skin Integrity Issues:: Other (Comment) Other: L ankle venous stasis wound  Last BM:  8/22 (type 6 x1, large amount)  Height:   Ht Readings from Last 1 Encounters:  07/13/22 5' 9" (1.753 m)    Weight:   Wt Readings from Last 1 Encounters:  07/24/22 71.1 kg     BMI:  Body mass index is 23.15 kg/m.  Estimated Nutritional Needs:  Kcal:  2100-2300 kcal Protein:  95-110 grams Fluid:  >/= 1.8 L/day     Jarome Matin, MS, RD, LDN, CNSC Registered Dietitian II Inpatient Clinical Nutrition RD pager # and on-call/weekend pager # available in [facility]

## 2022-07-24 NOTE — TOC Progression Note (Addendum)
Transition of Care [facility]) - Progression Note    Patient Details  Name: [name] MRN: [id number] Date of Birth: 09/25/1966  Transition of Care [facility]) CM/SW Contact  Golda Acre, RN Phone Number: 07/24/2022, 8:28 AM  Clinical Narrative:     TCT-ICR-message left for a social worker for correy to call back.  My number left . Tct debra whitaker-social worker-does have a friend  that keeps his o2 for him for when he needs it.  He is homeless and prefers to stay in the [place].  Uses a wheelchair and walker for ambulation.  He is call [name] when he gets settled. She states he has her number. Fl2 refaxed out for possible snf placement. Expected Discharge Plan: Homeless Shelter Barriers to Discharge: Continued Medical Work up  Expected Discharge Plan and Services Expected Discharge Plan: Homeless Shelter   Discharge Planning Services: CM Consult Post Acute Care Choice: NA (Patient had walker and wheelchair given to him and is not eligible for any DME for 5 years.) Living arrangements for the past 2 months: Homeless Shelter, Hotel/Motel                   DME Agency: NA       HH Arranged: NA HH Agency: NA         Social Determinants of Health (SDOH) Interventions    Readmission Risk Interventions   Row Labels 07/18/2022    4:11 PM 04/08/2022    1:43 PM 03/25/2022    5:39 PM  Readmission Risk Prevention Plan   Section Header. No data exists in this row.     Transportation Screening   Complete Complete Complete  Medication Review Oceanographer)   Complete Complete Referral to Pharmacy  PCP or Specialist appointment within 3-5 days of discharge   Complete Complete Complete  HRI or Home Care Consult   Complete Not Complete Not Complete  HRI or Home Care Consult Pt Refusal Comments    Pt is homeless Patient is homeless  SW Recovery Care/Counseling Consult   Complete Complete Complete  Palliative Care Screening    Not Applicable Not Applicable  Skilled Nursing  Facility   Complete Not Applicable Complete

## 2022-07-24 NOTE — Progress Notes (Signed)
Patient complains of shortness of breath, despite O2 sats remaining 100% on room air. RN turns on O2-2L for comfort for the patient. RN expresses the importance of appropriate breathing(in through nose, out through mouth).

## 2022-07-24 NOTE — Progress Notes (Signed)
Patient requested for staff to leave him alone while he is sleeping. Care clustered by this RN and NT tonight. Patient called to request PRN oxycodone at 0321 this morning. See eMAR for administration.

## 2022-07-24 NOTE — Progress Notes (Signed)
I have recommended that this patient have his capillary blood glucose checked as it is ordered, but he declines at this time.The patient verbalized to the staff that he just wanted me and the NT out of his room.

## 2022-07-24 NOTE — TOC Progression Note (Addendum)
Transition of Care [facility]) - Progression Note    Patient Details  Name: [name] MRN: [id number] Date of Birth: 1966/01/10  Transition of Care [facility]) CM/SW Contact  Golda Acre, RN Phone Number: 07/24/2022, 10:25 AM  Clinical Narrative:    Spoke with patient at length.  Refuses to tell where or who his o2 and belonging are with but states that he knows.  Will go to [facility] if accepted.  Does not want to go to a shelter but will consider [facility].  Refused to go where else.  When asked what he does for shelter in bad and cold weather his response is that the group of trusted individuals get on the back of the stage at [place].   1020-tct-tina Charles at [facility]-requested that she look at his information and call me call. 1200-tcf-[facility] will not have a male bed for several more days.  Wcb if one comes open. 1202-tct-[facility]-shelter is closed at this time. It will not open to the public in the near future. Tct-irc-message left for call back for shelter-. Tcf-irc no beds. Patient refuses to go to irc. The only shelter he agrees to go to is [facility] in [place] and it is closed and not accepting clients. Patient wants a motorized wheechair-application was faxed to [facility] on [phone].    Expected Discharge Plan: Homeless Shelter Barriers to Discharge: Continued Medical Work up  Expected Discharge Plan and Services Expected Discharge Plan: Homeless Shelter   Discharge Planning Services: CM Consult Post Acute Care Choice: NA (Patient had walker and wheelchair given to him and is not eligible for any DME for 5 years.) Living arrangements for the past 2 months: Homeless Shelter, Hotel/Motel                   DME Agency: NA       HH Arranged: NA HH Agency: NA         Social Determinants of Health (SDOH) Interventions    Readmission Risk Interventions   Row Labels  07/18/2022    4:11 PM 04/08/2022    1:43 PM 03/25/2022    5:39 PM  Readmission Risk Prevention Plan   Section Header. No data exists in this row.     Transportation Screening   Complete Complete Complete  Medication Review Oceanographer)   Complete Complete Referral to Pharmacy  PCP or Specialist appointment within 3-5 days of discharge   Complete Complete Complete  HRI or Home Care Consult   Complete Not Complete Not Complete  HRI or Home Care Consult Pt Refusal Comments    Pt is homeless Patient is homeless  SW Recovery Care/Counseling Consult   Complete Complete Complete  Palliative Care Screening    Not Applicable Not Applicable  Skilled Nursing Facility   Complete Not Applicable Complete

## 2022-07-24 NOTE — Progress Notes (Signed)
  Progress Note   Patient: [name] KDP:[id number] DOB: [birth year] DOA: 07/12/2022     11 DOS: the patient was seen and examined on 07/24/2022 at at 2:40PM      Brief hospital course: Mr. Straughter is a 56 y.o. M with CHF, homelessness who presented with progressive dyspnea, edema and cough, found to have CHF.  Hospitalization complicated by delirium, respiratory failure and intubation.       Assessment and Plan: * Acute on chronic systolic congestive heart failure Clinically stable - Continue furosemide and carvedilol - ARB, Entresto, spironolactone, ICD not possible due to noncompliance      Pressure injury of skin, ruled out    Hsitory DVT - Continue apixaban   Coronary artery disease involving native coronary artery of native heart without angina pectoris - Continue beta-blocker and apixaban   Schizoaffective disorder, bipolar type Pleasant, no active disease -Continue Abilify, trazodone, Klonopin           Subjective: Patient feels well.     Physical Exam: Vitals:   07/24/22 1000 07/24/22 1142 07/24/22 1430 07/24/22 1434  BP:  131/80    Pulse:  (!) 51    Resp:  16    Temp:  98.2 F (36.8 C)    TempSrc:  Oral    SpO2: 98% 98% 100% 100%  Weight:      Height:        Thin adult male, disheveled, lying in bed, poor hygiene, no acute distress RRR, no murmurs appreciated, no lower extremity edema in the left leg Respiratory rate normal, lungs clear without rales or wheezes Right AKA Attention normal, affect, judgment insight appear impaired, face symmetric, speech fluent        Data Reviewed: No new labs Glucose good       Disposition: Status is: Inpatient Patient was admitted with CHF.   He is medically ready for discharge, when a safe disposition can be arranged by social work.          Author: Alberteen Sam, MD 07/24/2022 7:56 PM  For on call review [email or web address].

## 2022-07-24 NOTE — TOC Progression Note (Addendum)
Transition of Care [facility]) - Progression Note    Patient Details  Name: [name] MRN: [id number] Date of Birth: 04/21/1966  Transition of Care [facility]) CM/SW Contact  Golda Acre, RN Phone Number: 07/24/2022, 1:51 PM  Clinical Narrative:    Tct-[facility]ine-unable to accept pt he owes the company money. Tct-[facility] -kitty-message left for call back. Will take due medicaid and drug abuse. [facility]-message left for call back./please fax over information./faxed information to admissions at 1420. Tct-soy at [facility] gray/message left message for call back.tcf-soy-v.m. to call back.  Tct-[facility]-tanya-left message for call back. Tct-[facility]-message left for call back. Tct-admission director-[facility]-message left on v.m. to call back. Tct-a.d.at [facility]-morgan-will look at information and call back. Unable to take due to [facility] payment only. Tct-[facility]-adm.dir.-message left for return call. Tct-[facility] - message left for adm.dir to call back. Tct-[facility]-unable to make call with provided number. Tct-[facility]- message left on v.mail to call back. Tct [facility] ad.dir-amber jones-message left on v.m. to call back with decision. Tct-[facility]-wendy-unable to take has to be at least 54 years of age. Tct-[facility]-unable to take [place] [facility].       Expected Discharge Plan: Homeless Shelter Barriers to Discharge: Continued Medical Work up  Expected Discharge Plan and Services Expected Discharge Plan: Homeless Shelter   Discharge Planning Services: CM Consult Post Acute Care Choice: NA (Patient had walker and wheelchair given to him and is not eligible for any DME for 5 years.) Living arrangements for the past 2 months: Homeless Shelter, Hotel/Motel                   DME Agency: NA       HH Arranged: NA HH Agency: NA         Social Determinants of  Health (SDOH) Interventions    Readmission Risk Interventions   Row Labels 07/18/2022    4:11 PM 04/08/2022    1:43 PM 03/25/2022    5:39 PM  Readmission Risk Prevention Plan   Section Header. No data exists in this row.     Transportation Screening   Complete Complete Complete  Medication Review Oceanographer)   Complete Complete Referral to Pharmacy  PCP or Specialist appointment within 3-5 days of discharge   Complete Complete Complete  HRI or Home Care Consult   Complete Not Complete Not Complete  HRI or Home Care Consult Pt Refusal Comments    Pt is homeless Patient is homeless  SW Recovery Care/Counseling Consult   Complete Complete Complete  Palliative Care Screening    Not Applicable Not Applicable  Skilled Nursing Facility   Complete Not Applicable Complete

## 2022-07-24 NOTE — Progress Notes (Signed)
Physical Therapy Treatment Patient Details Name: [name] MRN: [id number] DOB: 1966-11-06 Today's Date: 07/24/2022   History of Present Illness Pt is 56 yo male who presented to ED with with progressively worse dyspnea associated with lower extremity edema, orthopnea, occasional chest pressure sensation and whitish sputum productive cough after being off medications for the past 2 weeks. 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    PT Comments    Pt was able to transfer from bed to WC, WC to toilet, toilet to [facility], then back to bed, no physical assist needed for transfers but assist to stabilize WC was provided. Improved mobility noted today.    Recommendations for follow up therapy are one component of a multi-disciplinary discharge planning process, led by the attending physician.  Recommendations may be updated based on patient status, additional functional criteria and insurance authorization.  Follow Up Recommendations  Other (comment) (TBD) Can patient physically be transported by private vehicle: Yes   Assistance Recommended at Discharge Intermittent Supervision/Assistance  Patient can return home with the following Assist for transportation;Assistance with cooking/housework;A little help with bathing/dressing/bathroom;A little help with walking and/or transfers   Equipment Recommendations  Wheelchair (measurements PT);Wheelchair cushion (measurements PT) (pt's WC is worn and in poor condition)    Recommendations for Other Services       Precautions / Restrictions Precautions Precautions: Fall Precaution Comments: monitor O2 Restrictions RLE Weight Bearing: Non weight bearing     Mobility  Bed  Mobility Overal bed mobility: Needs Assistance Bed Mobility: Supine to Sit     Supine to sit: Supervision, HOB elevated     General bed mobility comments: used bedrail    Transfers Overall transfer level: Needs assistance Equipment used: None Transfers: Bed to chair/wheelchair/BSC             General transfer comment: pt pivoted on LLE bed to WC, WC to toilet, toilet to WC then WC to bed. No physical assist needed, however I stabilized WC during transfers per pt request.    Ambulation/Gait                   Stairs             Wheelchair Mobility    Modified Rankin (Stroke Patients Only)       Balance Overall balance assessment: Needs assistance Sitting-balance support: Feet supported, No upper extremity supported Sitting balance-Leahy Scale: Fair Sitting balance - Comments: sat at edge of bed without loss of balance                                    Cognition Arousal/Alertness: Awake/alert Behavior During Therapy: WFL for tasks assessed/performed Overall Cognitive Status: No family/caregiver present to determine baseline cognitive functioning                                          Exercises      General Comments        Pertinent Vitals/Pain Pain Assessment Faces Pain Scale: Hurts little more Breathing: occasional labored breathing, short period of hyperventilation Negative Vocalization: occasional moan/groan, low speech, negative/disapproving quality Facial Expression: sad,  frightened, frown Body Language: relaxed Consolability: no need to console PAINAD Score: 3 Facial Expression: Relaxed, neutral Body Movements: Absence of movements Muscle Tension: Relaxed Compliance with ventilator (intubated pts.): N/A Vocalization (extubated pts.): N/A CPOT Total: 0 Pain Location: LE/foot L Pain Descriptors / Indicators: Grimacing Pain Intervention(s): Limited activity within patient's tolerance, Monitored  during session, Repositioned    Home Living                          Prior Function            PT Goals (current goals can now be found in the care plan section) Acute Rehab PT Goals Patient Stated Goal: to get a new WC PT Goal Formulation: With patient Time For Goal Achievement: 07/27/22 Potential to Achieve Goals: Fair Progress towards PT goals: Progressing toward goals    Frequency    Min 2X/week      PT Plan Current plan remains appropriate    Co-evaluation              AM-PAC PT "6 Clicks" Mobility   Outcome Measure  Help needed turning from your back to your side while in a flat bed without using bedrails?: A Little Help needed moving from lying on your back to sitting on the side of a flat bed without using bedrails?: None Help needed moving to and from a bed to a chair (including a wheelchair)?: A Little Help needed standing up from a chair using your arms (e.g., wheelchair or bedside chair)?: None Help needed to walk in hospital room?: Total Help needed climbing 3-5 steps with a railing? : Total 6 Click Score: 16    End of Session   Activity Tolerance: Patient tolerated treatment well Patient left: in bed;with nursing/sitter in room;with call bell/phone within reach;with bed alarm set Nurse Communication: Mobility status PT Visit Diagnosis: Other abnormalities of gait and mobility (R26.89);Pain Pain - Right/Left: Left Pain - part of body: Ankle and joints of foot     Time: 1511-1533 PT Time Calculation (min) (ACUTE ONLY): 22 min  Charges:  $Therapeutic Activity: 8-22 mins                     Ralene Bathe Kistler PT 07/24/2022  Acute Rehabilitation Services  Office [phone]

## 2022-07-25 ENCOUNTER — Other Ambulatory Visit (HOSPITAL_COMMUNITY): Payer: Self-pay

## 2022-07-25 DIAGNOSIS — I5023 Acute on chronic systolic (congestive) heart failure: Secondary | ICD-10-CM | POA: Diagnosis not present

## 2022-07-25 LAB — GLUCOSE, CAPILLARY: Glucose-Capillary: 130 mg/dL — ABNORMAL HIGH (ref 70–99)

## 2022-07-25 MED ORDER — CARVEDILOL 3.125 MG PO TABS: 3.1250 mg | ORAL_TABLET | Freq: Two times a day (BID) | ORAL | 3 refills | Status: DC

## 2022-07-25 MED ORDER — THIAMINE HCL 100 MG PO TABS
100.0000 mg | ORAL_TABLET | Freq: Every day | ORAL | 3 refills | Status: DC
  Filled 2022-07-25: qty 30, 30d supply, fill #0

## 2022-07-25 MED ORDER — ALBUTEROL SULFATE HFA 108 (90 BASE) MCG/ACT IN AERS
2.0000 | INHALATION_SPRAY | Freq: Four times a day (QID) | RESPIRATORY_TRACT | 2 refills | Status: DC | PRN
  Filled 2022-07-25: qty 18, 25d supply, fill #0

## 2022-07-25 MED ORDER — ARIPIPRAZOLE 5 MG PO TABS: 5.0000 mg | ORAL_TABLET | Freq: Every day | ORAL | 3 refills | Status: DC

## 2022-07-25 MED ORDER — FOLIC ACID 1 MG PO TABS
1.0000 mg | ORAL_TABLET | Freq: Every day | ORAL | 3 refills | Status: DC
  Filled 2022-07-25: qty 30, 30d supply, fill #0

## 2022-07-25 MED ORDER — APIXABAN 5 MG PO TABS
5.0000 mg | ORAL_TABLET | Freq: Two times a day (BID) | ORAL | 3 refills | Status: DC
  Filled 2022-07-25: qty 60, 30d supply, fill #0

## 2022-07-25 MED ORDER — THIAMINE HCL 100 MG PO TABS: 100.0000 mg | ORAL_TABLET | Freq: Every day | ORAL | 3 refills | Status: DC

## 2022-07-25 MED ORDER — GABAPENTIN 400 MG PO CAPS
400.0000 mg | ORAL_CAPSULE | Freq: Three times a day (TID) | ORAL | 3 refills | Status: DC
  Filled 2022-07-25: qty 120, 40d supply, fill #0

## 2022-07-25 MED ORDER — GABAPENTIN 400 MG PO CAPS: 400.0000 mg | ORAL_CAPSULE | Freq: Three times a day (TID) | ORAL | 3 refills | Status: DC

## 2022-07-25 MED ORDER — POTASSIUM CHLORIDE CRYS ER 20 MEQ PO TBCR
40.0000 meq | EXTENDED_RELEASE_TABLET | Freq: Every day | ORAL | 3 refills | Status: DC
  Filled 2022-07-25: qty 60, 30d supply, fill #0

## 2022-07-25 MED ORDER — APIXABAN 5 MG PO TABS: 5.0000 mg | ORAL_TABLET | Freq: Two times a day (BID) | ORAL | 3 refills | Status: DC

## 2022-07-25 MED ORDER — ARIPIPRAZOLE 5 MG PO TABS
5.0000 mg | ORAL_TABLET | Freq: Every day | ORAL | 3 refills | Status: DC
  Filled 2022-07-25: qty 30, 30d supply, fill #0

## 2022-07-25 MED ORDER — FUROSEMIDE 40 MG PO TABS
40.0000 mg | ORAL_TABLET | Freq: Two times a day (BID) | ORAL | 3 refills | Status: DC
  Filled 2022-07-25: qty 60, 30d supply, fill #0

## 2022-07-25 MED ORDER — CARVEDILOL 3.125 MG PO TABS
3.1250 mg | ORAL_TABLET | Freq: Two times a day (BID) | ORAL | 3 refills | Status: DC
  Filled 2022-07-25: qty 60, 30d supply, fill #0

## 2022-07-25 MED ORDER — OXYCODONE HCL 5 MG PO TABS: 5.0000 mg | ORAL_TABLET | Freq: Three times a day (TID) | ORAL | 0 refills | Status: DC | PRN

## 2022-07-25 MED ORDER — OXYCODONE HCL 5 MG PO TABS
5.0000 mg | ORAL_TABLET | Freq: Three times a day (TID) | ORAL | 0 refills | Status: DC | PRN
  Filled 2022-07-25: qty 12, 4d supply, fill #0

## 2022-07-25 NOTE — Discharge Summary (Signed)
Physician Discharge Summary   Patient: [name] MRN: [id number] DOB: 1966-04-20  Admit date:     07/12/2022  Discharge date: 07/25/22  Discharge Physician: Alberteen Sam   PCP: [name], NP     Recommendations at discharge:  Follow up with Cardiology Dr. [name] in 2 weeks, appointment made and given to patient     Discharge Diagnoses: Principal Problem:   Acute on chronic systolic congestive heart failure (HCC) Active Problems:   Major depressive disorder, recurrent episode (HCC)   Schizoaffective disorder, bipolar type (HCC)   Alcohol use disorder, mild, abuse   COPD with acute exacerbation (HCC)   Acute hypoxemic respiratory failure (HCC)   Hyperlipidemia   Acute metabolic encephalopathy   Coronary artery disease involving native coronary artery of native heart without angina pectoris   Homelessness   Polysubstance abuse (HCC)   Prolonged QT interval   History of DVT (deep vein thrombosis)   CAP (community acquired pneumonia)   Venous ulcer (HCC)   Sinus bradycardia   Anemia of chronic disease   S/P AKA (above knee amputation), right [facility])      Hospital Course: Mr. Harshberger is a 56 y.o. M with CHF, homelessness who presented with progressive dyspnea, edema and cough, found to have CHF.     * Acute on chronic systolic congestive heart failure (HCC) CAP (community acquired pneumonia) Acute hypoxemic respiratory failure (HCC) Patient was admitted and started on Lasix, antibiotics.  Over the first 24 hours, he had increasing agitation and oxygen requirements, refused BiPAP, was transferred to the ICU and intubated.    Cardiology were consulted, he was subsequently diuresed, and extubated on hospital day 4.  He was ultimately transitioned to oral diuretics.  ARB/Entresto, spironolactone, and ICD are not possible due to noncompliance with medications.  The patient was weaned to room air, and TOC made every reasonable effort to procure SNF  rehabilitation for the patient but were unsuccessful.  TOC and I both reached out to the patient's social worker at the [facility].     S/P AKA (above knee amputation), right (HCC) Cerebrovascular disease Anemia of chronic disease Heart failure The patient has a right above-knee amputation which is recent, and weakness in the right arm from a prior stroke, both of which are permanent, and severely limiting his ability to safely maneuver in a manual wheelchair.  I recommend that he have a motorized wheelchair, application to Medicaid was made, and this is pending.      History of DVT (deep vein thrombosis) Heart failure Coronary artery disease  Schizoaffective disorder Refills of all the patient's medications were obtained prior to discharge and provided to him at the bedside.          The [facility] Controlled Substances Registry was reviewed for this patient prior to discharge.  Consultants: Cardiology, Critical Care Procedures performed: Intubation, Echocardiogram, Extubation  Disposition: Homeless shelter recommended, patient declined   DISCHARGE MEDICATION: Allergies as of 07/25/2022       Reactions   Motrin [ibuprofen] Anaphylaxis   Tylenol [acetaminophen] Hives, Nausea And Vomiting, Rash   Lyrica Cr [pregabalin Er] Other (See Comments)   Dystonia Dysarthria    Peanut-containing Drug Products Other (See Comments)   Pt denies allergy to peanut butter 03/02/2021        Medication List     STOP taking these medications    levETIRAcetam 500 MG tablet Commonly known as: KEPPRA       TAKE these medications    ARIPiprazole  5 MG tablet Commonly known as: ABILIFY Take 1 tablet (5 mg total) by mouth daily.   carvedilol 3.125 MG tablet Commonly known as: COREG Take 1 tablet (3.125 mg total) by mouth 2 (two) times daily with a meal.   Eliquis 5 MG Tabs tablet Generic drug: apixaban Take 1 tablet (5 mg total) by mouth 2 (two) times daily.   folic acid 1 MG  tablet Commonly known as: FOLVITE Take 1 tablet (1 mg total) by mouth daily.   furosemide 40 MG tablet Commonly known as: LASIX Take 1 tablet (40 mg total) by mouth 2 (two) times daily. What changed: when to take this   gabapentin 400 MG capsule Commonly known as: NEURONTIN Take 1 capsule (400 mg total) by mouth 3 (three) times daily.   oxyCODONE 5 MG immediate release tablet Commonly known as: Oxy IR/ROXICODONE Take 1 tablet (5 mg total) by mouth every 8 (eight) hours as needed for severe pain. What changed:  when to take this reasons to take this Another medication with the same name was removed. Continue taking this medication, and follow the directions you see here.   potassium chloride SA 20 MEQ tablet Commonly known as: KLOR-CON M Take 2 tablets (40 mEq total) by mouth daily.   thiamine 100 MG tablet Commonly known as: VITAMIN B1 Take 1 tablet (100 mg total) by mouth daily.   Ventolin HFA 108 (90 Base) MCG/ACT inhaler Generic drug: albuterol Inhale 2 puffs into the lungs every 6 (six) hours as needed for wheezing or shortness of breath.               Durable Medical Equipment  (From admission, onward)           Start     Ordered   07/23/22 1111  For home use only DME Other see comment  Once       Comments: Motorized wheelchair  Question:  Length of Need  Answer:  Lifetime   07/23/22 1112              Discharge Care Instructions  (From admission, onward)           Start     Ordered   07/25/22 0000  Discharge wound care:       Comments: Wash leg with soap and water and pat dry.  Cover with calcium alginate dressing, top with dry gauze.  Wrap from just below toes to just below knee.  Top with ACE wrap.  Change every other day.   07/25/22 1346            Follow-up Information     Minus Breeding, MD Follow up on 08/06/2022.   Specialty: Cardiology Why: @9am  for hospital follow up Contact information: [address] [place] [address] [phone]                 Discharge Instructions     Discharge wound care:   Complete by: As directed    Wash leg with soap and water and pat dry.  Cover with calcium alginate dressing, top with dry gauze.  Wrap from just below toes to just below knee.  Top with ACE wrap.  Change every other day.   Increase activity slowly   Complete by: As directed        Discharge Exam: Filed Weights   07/23/22 0453 07/24/22 0558 07/25/22 0700  Weight: 70 kg 71.1 kg 68.6 kg    General: Pt is alert, awake,  not in acute distress, interactive, lying in bed Cardiovascular: RRR, nl S1-S2, no murmurs appreciated.   No LE edema in left leg.   Respiratory: Normal respiratory rate and rhythm.  CTAB without rales or wheezes. Abdominal: Abdomen soft and non-tender.  No distension or HSM.   Neuro/Psych: Strength symmetric in upper and lower extremities.  Judgment and insight appear at baseline.   Condition at discharge: stable  The results of significant diagnostics from this hospitalization (including imaging, microbiology, ancillary and laboratory) are listed below for reference.   Imaging Studies: DG CHEST PORT 1 VIEW  Result Date: 07/22/2022 CLINICAL DATA:  Worsening dyspnea over the last few days. History of congestive heart failure. EXAM: PORTABLE CHEST 1 VIEW COMPARISON:  Radiographs 07/13/2022 and 07/12/2022.  CT 03/28/2022. FINDINGS: 0828 hours. Interval extubation and removal of the enteric tube. A left subclavian central venous catheter projects to the level of the confluence of the brachiocephalic veins, unchanged. Mild patient rotation to the right. Allowing for this, the heart size and mediastinal contours are stable. However, there is mild volume loss at the right lung base associated with increased right basilar airspace disease, primarily peripherally in the middle lobe. No significant pleural effusion. The left lung is clear. There is no pneumothorax or  acute osseous abnormality. Telemetry leads overlie the chest. IMPRESSION: 1. Progressive right basilar airspace disease with associated increased volume loss, most consistent with pneumonia. Correlate clinically. Recommend radiographic follow-up to document clearing and exclude underlying malignancy. 2. Unchanged position of left subclavian central venous catheter. Electronically Signed   By: Richardean Sale M.D.   On: 07/22/2022 08:43   DG Abd 1 View  Result Date: 07/17/2022 CLINICAL DATA:  Nasogastric tube placement. EXAM: ABDOMEN - 1 VIEW COMPARISON:  07/15/2022 FINDINGS: The previously seen nasogastric tube has been removed. Interval weighted feeding tube with its tip in the proximal stomach laterally on the left. Unremarkable bowel gas pattern. Bibasilar atelectasis with mild improvement on the left and mild worsening on right. Unremarkable bones thoracolumbar spine degenerative changes. IMPRESSION: Feeding tube tip in the proximal stomach. Electronically Signed   By: Claudie Revering M.D.   On: 07/17/2022 12:36   DG Abd 1 View  Result Date: 07/15/2022 CLINICAL DATA:  NG tube placement EXAM: ABDOMEN - 1 VIEW COMPARISON:  KUB 12/17/2020 FINDINGS: The enteric catheter tip and sidehole project over the stomach. There is a nonobstructive bowel gas pattern. There is no definite free intraperitoneal air, within the confines of semi-erect technique. There is no gross organomegaly or abnormal soft tissue calcification. The imaged bones are unremarkable. There are persistent patchy opacities in the right lower lobe, similar to the prior chest radiograph. There is a possible trace right pleural effusion. IMPRESSION: 1. Enteric catheter tip and sidehole project over the stomach. 2. Persistent patchy opacities in the right lower lobe and possible trace right pleural effusion. Electronically Signed   By: Valetta Mole M.D.   On: 07/15/2022 11:56   ECHOCARDIOGRAM COMPLETE  Result Date: 07/14/2022    ECHOCARDIOGRAM  REPORT   Patient Name:   DERRIK HAUSKINS Date of Exam: 07/14/2022 Medical Rec #:  [id number]      Height:       69.0 in Accession #:    [id number]     Weight:       166.7 lb Date of Birth:  [birth year]      BSA:          1.912 m Patient Age:    13 years  BP:           89/65 mmHg Patient Gender: M              HR:           90 bpm. Exam Location:  Inpatient Procedure: 2D Echo, 3D Echo, Cardiac Doppler and Color Doppler Indications:    Cardiomyopathy-Unspecified I42.9  History:        Patient has prior history of Echocardiogram examinations, most                 recent 01/30/2022. CHF, COPD; Risk Factors:Hypertension, GRED and                 Diabetes.  Sonographer:    Bernadene Person RDCS Referring Phys: [id number] ANKIT CHIRAG AMIN IMPRESSIONS  1. Left ventricular ejection fraction, by estimation, is <20%. Left ventricular ejection fraction by 3D volume is 20 %. The left ventricle has severely decreased function. The left ventricle demonstrates global hypokinesis. The left ventricular internal  cavity size was moderately dilated. There is mild left ventricular hypertrophy. Left ventricular diastolic parameters are consistent with Grade II diastolic dysfunction (pseudonormalization). Elevated left ventricular end-diastolic pressure. The E/e' is  24.  2. Right ventricular systolic function is moderately reduced. The right ventricular size is mildly enlarged. There is normal pulmonary artery systolic pressure. The estimated right ventricular systolic pressure is Q000111Q mmHg.  3. The mitral valve is abnormal. Trivial mitral valve regurgitation.  4. The aortic valve is tricuspid. Aortic valve regurgitation is not visualized.  5. The inferior vena cava is normal in size with <50% respiratory variability, suggesting right atrial pressure of 8 mmHg. Comparison(s): No significant change from prior study. 01/30/2022: LVEF <20%, grade 3 DD, elevated LV filling pressure. FINDINGS  Left Ventricle: Left ventricular ejection fraction, by  estimation, is <20%. Left ventricular ejection fraction by 3D volume is 20 %. The left ventricle has severely decreased function. The left ventricle demonstrates global hypokinesis. The left ventricular internal cavity size was moderately dilated. There is mild left ventricular hypertrophy. Left ventricular diastolic parameters are consistent with Grade II diastolic dysfunction (pseudonormalization). Elevated left ventricular end-diastolic pressure. The E/e' is 24. Right Ventricle: The right ventricular size is mildly enlarged. No increase in right ventricular wall thickness. Right ventricular systolic function is moderately reduced. There is normal pulmonary artery systolic pressure. The tricuspid regurgitant velocity is 2.16 m/s, and with an assumed right atrial pressure of 8 mmHg, the estimated right ventricular systolic pressure is Q000111Q mmHg. Left Atrium: Left atrial size was normal in size. Right Atrium: Right atrial size was normal in size. Pericardium: There is no evidence of pericardial effusion. Mitral Valve: The mitral valve is abnormal. There is mild thickening of the anterior and posterior mitral valve leaflet(s). Trivial mitral valve regurgitation. Tricuspid Valve: The tricuspid valve is grossly normal. Tricuspid valve regurgitation is trivial. Aortic Valve: The aortic valve is tricuspid. Aortic valve regurgitation is not visualized. Pulmonic Valve: The pulmonic valve was normal in structure. Pulmonic valve regurgitation is not visualized. Aorta: The aortic root and ascending aorta are structurally normal, with no evidence of dilitation. Venous: The inferior vena cava is normal in size with less than 50% respiratory variability, suggesting right atrial pressure of 8 mmHg. IAS/Shunts: No atrial level shunt detected by color flow Doppler.  LEFT VENTRICLE PLAX 2D LVIDd:         6.10 cm         Diastology LVIDs:         5.60  cm         LV e' medial:    2.98 cm/s LV PW:         1.30 cm         LV E/e'  medial:  29.3 LV IVS:        1.10 cm         LV e' lateral:   4.28 cm/s LVOT diam:     2.10 cm         LV E/e' lateral: 20.4 LV SV:         34 LV SV Index:   18 LVOT Area:     3.46 cm        3D Volume EF                                LV 3D EF:    Left                                             ventricul LV Volumes (MOD)                            ar LV vol d, MOD    199.0 ml                   ejection A2C:                                        fraction LV vol d, MOD    193.0 ml                   by 3D A4C:                                        volume is LV vol s, MOD    180.0 ml                   20 %. A2C: LV vol s, MOD    151.0 ml A4C:                           3D Volume EF: LV SV MOD A2C:   19.0 ml       3D EF:        20 % LV SV MOD A4C:   193.0 ml      LV EDV:       278 ml LV SV MOD BP:    30.6 ml       LV ESV:       223 ml                                LV SV:        55 ml RIGHT VENTRICLE RV S prime:     7.60 cm/s TAPSE (M-mode): 1.5 cm LEFT ATRIUM             Index        RIGHT  ATRIUM           Index LA diam:        4.60 cm 2.41 cm/m   RA Area:     19.40 cm LA Vol (A2C):   66.8 ml 34.94 ml/m  RA Volume:   58.50 ml  30.60 ml/m LA Vol (A4C):   55.8 ml 29.18 ml/m LA Biplane Vol: 62.1 ml 32.48 ml/m  AORTIC VALVE LVOT Vmax:   67.90 cm/s LVOT Vmean:  40.700 cm/s LVOT VTI:    0.098 m  AORTA Ao Root diam: 3.20 cm Ao Asc diam:  3.50 cm MITRAL VALVE               TRICUSPID VALVE MV Area (PHT): 5.23 cm    TR Peak grad:   18.7 mmHg MV Decel Time: 145 msec    TR Vmax:        216.00 cm/s MV E velocity: 87.40 cm/s MV A velocity: 65.50 cm/s  SHUNTS MV E/A ratio:  1.33        Systemic VTI:  0.10 m                            Systemic Diam: 2.10 cm Lyman Bishop MD Electronically signed by Lyman Bishop MD Signature Date/Time: 07/14/2022/11:34:23 AM    Final    DG CHEST PORT 1 VIEW  Result Date: 07/13/2022 CLINICAL DATA:  Status post intubation EXAM: PORTABLE CHEST 1 VIEW COMPARISON:  07/12/2022 FINDINGS: Cardiac  shadow is enlarged. Gastric catheter extends into the stomach. Proximal side port lies at the gastroesophageal junction. This could be advanced deeper into the stomach. Endotracheal tube is seen with the catheter tip approximately 7 cm above the carina. Left subclavian central line is noted at the junction of the innominate veins. No pneumothorax is seen. Patchy airspace opacity is again noted in the right base laterally stable from the prior exam. Left lung remains clear. IMPRESSION: Tubes and lines as described. Persistent density in the lateral right lung base. Electronically Signed   By: Inez Catalina M.D.   On: 07/13/2022 21:15   DG Ankle Complete Left  Result Date: 07/12/2022 CLINICAL DATA:  Ankle wounds. EXAM: LEFT ANKLE COMPLETE - 3+ VIEW COMPARISON:  None Available. FINDINGS: Apparent skin defect identified laterally. Mild soft tissue swelling. No soft tissue gas identified. No bony erosion. No fractures. IMPRESSION: No evidence of osteomyelitis. No fracture. Apparent skin defect laterally. Electronically Signed   By: Dorise Bullion III M.D.   On: 07/12/2022 08:53   DG Foot Complete Left  Result Date: 07/12/2022 CLINICAL DATA:  Foot and ankle wounds. EXAM: LEFT FOOT - COMPLETE 3+ VIEW COMPARISON:  None Available. FINDINGS: The patient is status post amputation of his great toe. No fractures are identified. No bony erosion to suggest osteomyelitis. No soft tissue gas identified. A pes planus deformity is identified. No other abnormalities. IMPRESSION: No osteomyelitis.  No fractures. Electronically Signed   By: Dorise Bullion III M.D.   On: 07/12/2022 08:51   DG Chest 2 View  Result Date: 07/12/2022 CLINICAL DATA:  Shortness of breath. None complaints CHF. Ankle wounds. EXAM: CHEST - 2 VIEW COMPARISON:  None Available. FINDINGS: Focal opacity in the periphery of the right lung not identified on the March 23, 2022 chest x-ray. No pneumothorax. No overt edema. Stable cardiomegaly. The hila and  mediastinum are normal. IMPRESSION: Focal opacity/infiltrate in the periphery of the right mid lung, not present on the  March 23, 2022 study. Findings could represent pneumonia. If the patient has signs of infection, recommend treatment with short-term follow-up chest x-ray imaging to ensure resolution. If there are no signs of infection, a CT scan could better evaluate. Cardiomegaly.  No overt edema. Electronically Signed   By: Dorise Bullion III M.D.   On: 07/12/2022 08:48    Microbiology: Results for orders placed or performed during the hospital encounter of 07/12/22  SARS Coronavirus 2 by RT PCR (hospital order, performed in [facility] hospital lab) *cepheid single result test* Anterior Nasal Swab     Status: None   Collection Time: 07/12/22 10:49 AM   Specimen: Anterior Nasal Swab  Result Value Ref Range Status   SARS Coronavirus 2 by RT PCR NEGATIVE NEGATIVE Final    Comment: (NOTE) SARS-CoV-2 target nucleic acids are NOT DETECTED.  The SARS-CoV-2 RNA is generally detectable in upper and lower respiratory specimens during the acute phase of infection. The lowest concentration of SARS-CoV-2 viral copies this assay can detect is 250 copies / mL. A negative result does not preclude SARS-CoV-2 infection and should not be used as the sole basis for treatment or other patient management decisions.  A negative result may occur with improper specimen collection / handling, submission of specimen other than nasopharyngeal swab, presence of viral mutation(s) within the areas targeted by this assay, and inadequate number of viral copies (<250 copies / mL). A negative result must be combined with clinical observations, patient history, and epidemiological information.  Fact Sheet for Patients:   https://www.patel.info/  Fact Sheet for Healthcare Providers: https://hall.com/  This test is not yet approved or  cleared by the [place] FDA  and has been authorized for detection and/or diagnosis of SARS-CoV-2 by FDA under an Emergency Use Authorization (EUA).  This EUA will remain in effect (meaning this test can be used) for the duration of the COVID-19 declaration under Section 564(b)(1) of the Act, 21 U.S.C. section 360bbb-3(b)(1), unless the authorization is terminated or revoked sooner.  Performed at [facility], [place] [address] [place], [place] [address]   MRSA Next Gen by PCR, Nasal     Status: None   Collection Time: 07/13/22  8:55 PM   Specimen: Nasal Mucosa; Nasal Swab  Result Value Ref Range Status   MRSA by PCR Next Gen NOT DETECTED NOT DETECTED Final    Comment: (NOTE) The GeneXpert MRSA Assay (FDA approved for NASAL specimens only), is one component of a comprehensive MRSA colonization surveillance program. It is not intended to diagnose MRSA infection nor to guide or monitor treatment for MRSA infections. Test performance is not FDA approved in patients less than 52 years old. Performed at [facility], [place] [address], [place], [place] [address]   Culture, Respiratory w Gram Stain     Status: None   Collection Time: 07/13/22 11:41 PM   Specimen: Tracheal Aspirate; Respiratory  Result Value Ref Range Status   Specimen Description   Final    TRACHEAL ASPIRATE Performed at [facility] [address] [place], [place] [address]    Special Requests   Final    NONE Performed at [facility], [place] [address] [place], [place] [address]    Gram Stain   Final    NO ORGANISMS SEEN RARE WBC PRESENT, PREDOMINANTLY PMN RARE SQUAMOUS EPITHELIAL CELLS PRESENT    Culture   Final    RARE Normal respiratory flora-no Staph aureus or Pseudomonas seen Performed  at [facility], [address] [place], [name] [address]    Report Status 07/16/2022 FINAL  Final    Labs: CBC: Recent Labs  Lab 07/19/22 0522 07/23/22 0340  WBC  7.1 4.4  HGB 10.5* 11.4*  HCT 33.5* 36.4*  MCV 93.3 92.2  PLT 302 343   Basic Metabolic Panel: Recent Labs  Lab 07/18/22 2102 07/19/22 0522 07/20/22 0529 07/21/22 0712 07/22/22 0500 07/23/22 0340  NA  --  140 139 136 138 140  K  --  3.7 3.6 3.3* 3.6 4.2  CL  --  107 106 101 103 103  CO2  --  26 28 28 29 30   GLUCOSE  --  164* 95 114* 125* 99  BUN  --  12 11 7 9 11   CREATININE  --  0.79 0.78 0.77 0.90 0.94  CALCIUM  --  8.2* 8.2* 8.0* 8.2* 8.5*  MG 2.1 2.0 1.8 1.7 1.7 1.8  PHOS 4.7*  --   --   --   --   --    Liver Function Tests: Recent Labs  Lab 07/19/22 0522 07/23/22 0340  AST 15 14*  ALT 12 11  ALKPHOS 73 70  BILITOT 0.5 0.3  PROT 6.5 6.9  ALBUMIN 2.5* 2.7*   CBG: Recent Labs  Lab 07/23/22 1635 07/24/22 0730 07/24/22 1141 07/24/22 1752 07/25/22 0833  GLUCAP 109* 138* 145* 158* 130*    Discharge time spent: approximately 35 minutes spent on discharge counseling, evaluation of patient on day of discharge, and coordination of discharge planning with nursing, social work, pharmacy and case management  Signed: 07/26/22, MD [facility] 07/25/2022

## 2022-07-25 NOTE — TOC Progression Note (Addendum)
Transition of Care [facility]) - Progression Note    Patient Details  Name: [name] MRN: [id number] Date of Birth: June 25, 1966  Transition of Care [facility]) CM/SW Contact  Golda Acre, RN Phone Number: 07/25/2022, 10:46 AM  Clinical Narrative:    spoke with the patient and updated him on search for placement.  There have been no bed offers due to his insurance and history of alcohol and drug use   patient enquiring about the motorized wheelchair have not heard anything from [facility] or medicaid.  Tct-danielle at [facility] she will check into the status for claim. Patient status in physical therapy has changed from snf to home.  See note about ambulation ability.  Dr. Maryfrances Bunnell updated. Spoke with patient is waiting on the Neurosurgeon before leaving.  Patient has number to transportation through medicaid.(The client has to call).  Patient is content to go back to [place] where he chooses to reside. Patient has his manual wheelchair and belongs are in a blue backpack that is in his possession. Medications are to be delivered to the room by pharmacy.  If medicaid approves powered wheelchair and wants to deliver will be sent to irc at [address] [place] [phone] Expected Discharge Plan: Homeless Shelter Barriers to Discharge: Continued Medical Work up  Expected Discharge Plan and Services Expected Discharge Plan: Homeless Shelter   Discharge Planning Services: CM Consult Post Acute Care Choice: NA (Patient had walker and wheelchair given to him and is not eligible for any DME for 5 years.) Living arrangements for the past 2 months: Homeless Shelter, Hotel/Motel                   DME Agency: NA       HH Arranged: NA HH Agency: NA         Social Determinants of Health (SDOH) Interventions    Readmission Risk Interventions   Row Labels 07/18/2022    4:11 PM 04/08/2022    1:43 PM 03/25/2022    5:39 PM  Readmission Risk Prevention Plan   Section Header. No  data exists in this row.     Transportation Screening   Complete Complete Complete  Medication Review Oceanographer)   Complete Complete Referral to Pharmacy  PCP or Specialist appointment within 3-5 days of discharge   Complete Complete Complete  HRI or Home Care Consult   Complete Not Complete Not Complete  HRI or Home Care Consult Pt Refusal Comments    Pt is homeless Patient is homeless  SW Recovery Care/Counseling Consult   Complete Complete Complete  Palliative Care Screening    Not Applicable Not Applicable  Skilled Nursing Facility   Complete Not Applicable Complete

## 2022-07-25 NOTE — TOC Transition Note (Signed)
Transition of Care [facility]) - CM/SW Discharge Note   Patient Details  Name: [name] MRN: [id number] Date of Birth: [birth year]  Transition of Care [facility]) CM/SW Contact:  Lavenia Atlas, RN Phone Number: 07/25/2022, 3:35 PM   Clinical Narrative:   Provided taxi voucher for patient to return to [facility] in [place], [name]. Provided patient and SW Whitaker with patient's Medici[employer] ID#.   No additional TOC needs at this time.    Final next level of care: Homeless Shelter Barriers to Discharge: Continued Medical Work up   Patient Goals and CMS Choice Patient states their goals for this hospitalization and ongoing recovery are:: skilled nursing facility CMS Medicare.gov Compare Post Acute Care list provided to:: Patient    Discharge Placement                       Discharge Plan and Services   Discharge Planning Services: CM Consult Post Acute Care Choice: NA (Patient had walker and wheelchair given to him and is not eligible for any DME for 5 years.)            DME Agency: NA       HH Arranged: NA HH Agency: NA        Social Determinants of Health (SDOH) Interventions     Readmission Risk Interventions    07/18/2022    4:11 PM 04/08/2022    1:43 PM 03/25/2022    5:39 PM  Readmission Risk Prevention Plan  Transportation Screening Complete Complete Complete  Medication Review Oceanographer) Complete Complete Referral to Pharmacy  PCP or Specialist appointment within 3-5 days of discharge Complete Complete Complete  HRI or Home Care Consult Complete Not Complete Not Complete  HRI or Home Care Consult Pt Refusal Comments  Pt is homeless Patient is homeless  SW Recovery Care/Counseling Consult Complete Complete Complete  Palliative Care Screening  Not Applicable Not Applicable  Skilled Nursing Facility Complete Not Applicable Complete

## 2022-08-04 NOTE — Progress Notes (Deleted)
Cardiology Office Note   Date:  08/04/2022   ID:  [name], DOB 1966/05/08, MRN [id number]  PCP:  [name], NP  Cardiologist:   Rollene Rotunda, MD Referring:  ***  No chief complaint on file.     History of Present Illness: [name] is a 56 y.o. male who presents for ***     history of noncompliance and polysubstance abuse with alcohol and cocaine.  We had seen him multiple times in the hospital but he never follows up in the office.  He has 100% no-show rate in our office.  He was initially diagnosed with systolic CHF in 2018 when he was found to have a LVEF of 35-40%.  However, repeat echo in 03/2019 showed significant drop in LVEF to 15%. Prior cardiac catheterization at [facility] in 10/2019 showed diffuse atherosclerosis in all coronary distributions with the worst lesion being 60-70% stenosis in the proximal circumflex.  Degree of cardiomyopathy was felt to be out of proportion to degree of CAD suggesting a nonischemic etiology.  He underwent coronary CTA in 03/2020 which showed moderate to severe CAD.  Functional assessment was recommended at that time; however, he left AMA prior to evaluation.  Nuclear stress test was ordered as an outpatient but was deferred in the setting of wheezing on exam.  He never followed up in clinic after this.  Overall, he has been felt to be a poor candidate for invasive ischemic evaluation due to noncompliance and cocaine abuse.    Hospitalized 01/2022 with slurred speech, HTN urgency, MRI brain neg for CVA, + CHF, COPD with exacerbation  EF <20%.  He left AMA.  Has had several ER visit for leg pain, SOB.  Hospitalized in 03/2022 with acute on chronic combined CHF.   Pt was noted to be homeless and with cellulitis as well. +DVT in brachial vein at that time, placed on eliquis.  + depression and evaluated by psychiatry.     Further admits for leg pain, sepsis and leg wounds. Possible osteo, MRI second digit-ABIs normal on 3/23 ortho saw.  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 Marland Kitchen   Pt presented to ER on 8/12/23by EMS for SOB, had been noncompliant with lasix and home 02.  None in 2 weeks.  He also complained of Lt lower ext pain.   He had on CXR possible PNA, Rt midlung opacity, and lt foot and ankle xrays without osteomyelitis or fx.  Admitted with acute on chronic combined HF, BB held due to acute decompensation. COPD with acute exacerbation.  PNA placed on ceftriaxone and doxy.  + QT prolongation with hx of this.    Past Medical History:  Diagnosis Date   Asthma    Bipolar 1 disorder (HCC)    CHF (congestive heart failure) (HCC)    COPD (chronic obstructive pulmonary disease) (HCC)    Depression    Diabetes mellitus without complication (HCC)    GERD (gastroesophageal reflux disease)    Homelessness    Hypertension    QT prolongation    Schizoaffective disorder [facility])     Past Surgical History:  Procedure Laterality Date   AMPUTATION TOE Bilateral 01/02/2021   Procedure: AMPUTATION TOE;  Surgeon: Park Liter, DPM;  Location: [facility] OR;  Service: Podiatry;  Laterality: Bilateral;  Ok to leave patient on hospital bed   IR ANGIO INTRA EXTRACRAN SEL COM CAROTID INNOMINATE BILAT MOD SED  12/17/2020   IR ANGIO VERTEBRAL SEL VERTEBRAL  BILAT MOD SED  12/17/2020   RADIOLOGY WITH ANESTHESIA N/A 12/17/2020   Procedure: IR WITH ANESTHESIA;  Surgeon: Radiologist, Medication, MD;  Location: [facility] OR;  Service: Radiology;  Laterality: N/A;     Current Outpatient Medications  Medication Sig Dispense Refill   albuterol (VENTOLIN HFA) 108 (90 Base) MCG/ACT inhaler Inhale 2 puffs into the lungs every 6 (six) hours as needed for wheezing or shortness of breath. 6.7 g 2   apixaban (ELIQUIS) 5 MG TABS tablet Take 1 tablet (5 mg total) by mouth 2 (two) times daily. 60 tablet 3   ARIPiprazole (ABILIFY) 5 MG tablet Take 1 tablet (5 mg total) by mouth daily. 30 tablet 3   carvedilol (COREG) 3.125 MG tablet Take 1 tablet (3.125 mg total) by  mouth 2 (two) times daily with a meal. 60 tablet 3   folic acid (FOLVITE) 1 MG tablet Take 1 tablet (1 mg total) by mouth daily. 30 tablet 3   furosemide (LASIX) 40 MG tablet Take 1 tablet (40 mg total) by mouth 2 (two) times daily. 60 tablet 3   gabapentin (NEURONTIN) 400 MG capsule Take 1 capsule (400 mg total) by mouth 3 (three) times daily. 120 capsule 3   oxyCODONE (OXY IR/ROXICODONE) 5 MG immediate release tablet Take 1 tablet (5 mg total) by mouth every 8 (eight) hours as needed for severe pain. 12 tablet 0   potassium chloride SA (KLOR-CON M) 20 MEQ tablet Take 2 tablets (40 mEq total) by mouth daily. 60 tablet 3   thiamine (VITAMIN B1) 100 MG tablet Take 1 tablet (100 mg total) by mouth daily. 30 tablet 3   No current facility-administered medications for this visit.    Allergies:   Motrin [ibuprofen], Tylenol [acetaminophen], Lyrica cr [pregabalin er], and Peanut-containing drug products    Social History:  The patient  reports that he has been smoking cigarettes. He has never used smokeless tobacco. He reports current alcohol use. He reports current drug use. Drug: Cocaine.   Family History:  The patient's ***family history includes Drug abuse in his mother; Suicidality in his mother.    ROS:  Please see the history of present illness.   Otherwise, review of systems are positive for {NONE DEFAULTED:18576}.   All other systems are reviewed and negative.    PHYSICAL EXAM: VS:  There were no vitals taken for this visit. , BMI There is no height or weight on file to calculate BMI. GENERAL:  Well appearing HEENT:  Pupils equal round and reactive, fundi not visualized, oral mucosa unremarkable NECK:  No jugular venous distention, waveform within normal limits, carotid upstroke brisk and symmetric, no bruits, no thyromegaly LYMPHATICS:  No cervical, inguinal adenopathy LUNGS:  Clear to auscultation bilaterally BACK:  No CVA tenderness CHEST:  Unremarkable HEART:  PMI not displaced  or sustained,S1 and S2 within normal limits, no S3, no S4, no clicks, no rubs, *** murmurs ABD:  Flat, positive bowel sounds normal in frequency in pitch, no bruits, no rebound, no guarding, no midline pulsatile mass, no hepatomegaly, no splenomegaly EXT:  2 plus pulses throughout, no edema, no cyanosis no clubbing SKIN:  No rashes no nodules NEURO:  Cranial nerves II through XII grossly intact, motor grossly intact throughout PSYCH:  Cognitively intact, oriented to person place and time    EKG:  EKG {ACTION; IS/IS ZOX:09604540} ordered today. The ekg ordered today demonstrates ***   Recent Labs: 07/22/2022: B Natriuretic Peptide 611.6 07/23/2022: ALT 11; BUN 11; Creatinine, Ser 0.94; Hemoglobin  11.4; Magnesium 1.8; Platelets 343; Potassium 4.2; Sodium 140    Lipid Panel    Component Value Date/Time   CHOL 109 03/04/2022 0335   TRIG 52 07/14/2022 0355   HDL 55 03/04/2022 0335   CHOLHDL 2.0 03/04/2022 0335   VLDL 9 03/04/2022 0335   LDLCALC 45 03/04/2022 0335      Wt Readings from Last 3 Encounters:  07/25/22 151 lb 3.8 oz (68.6 kg)  05/19/22 183 lb 3.2 oz (83.1 kg)  04/05/22 183 lb 3.2 oz (83.1 kg)      Other studies Reviewed: Additional studies/ records that were reviewed today include: ***. Review of the above records demonstrates:  Please see elsewhere in the note.  ***   ASSESSMENT AND PLAN:   Acute combined systolic and diastolic HF/NICM:  ***   -  prior EF in March this year <20%, pt at times is non compliant and has been homeless.  He has never followed up in our office.  This combines to make treatment difficult.   Now after total of lasix 80 he is neg 1982, (with hypotension and low CVP reading 1 l LR given.) last echo with mod MR. (Initally dx of systolic CHF in 2018 with EF 35-40%, by 2020 EF to 15%.  And per cath at [facility] in 2020 cardiomyopathy out of proportion to degree of CAD suggestion NICM)  CO-OX 68 pt warm and dry.  On BB and imdur as outpt.   Spironolactone would be difficult to add as pt never follows up as outpt. Along with other meds.   CAD :      with cardiac cath at [facility] with diffuse atherosclerosis in all coronary distrubution with worst lesion 60-70% stenosis in pLCX.  Coronary CTA in 2021 mod to severe CAD. Nuc study with significant motion and therefore non diagnostic. Troponin pk 45 from CHF. On prior admits average in the 40s.  Per Dr. Eden Emms in 03/2022  "felt to be candidate for any further ischemic evaluation due to ongoing medication noncompliance and cocaine use"  currently sedated on Vent would resume imdur and coreg when improved.  Acute hypoxic resp failure:  ***  and Rt mid lung opacity Intubated and Vent last evening. per CCM    Prolonged QTc:  *** -monitor   Hx DVT:  ***   (brachial vein 03/2022) on Eliquis but held currently due to coffee ground drainage from OT.  Will need to decide in next 24 hours to resume.   GI bleed :  ***  and anemia of chronic disease see above   Hx of poly substance abuse:  ***   and acute toxic and metabolic encephalopathy. Per IM and CCM  Rt AKA:  ***   in [facility] in May.  Hx of Lt great toe amputation several dressings on Lt foot for wounds  ABIs were normal in March.  Hypokalemia and magnesium:  ***  , being replaced.  Keep K+ 4.0 and Mg+ 2.0    Current medicines are reviewed at length with the patient today.  The patient {ACTIONS; HAS/DOES NOT HAVE:19233} concerns regarding medicines.  The following changes have been made:  {PLAN; NO CHANGE:13088:s}  Labs/ tests ordered today include: *** No orders of the defined types were placed in this encounter.    Disposition:   FU with ***    Signed, Rollene Rotunda, MD  08/04/2022 1:32 PM    [facility]

## 2022-08-06 ENCOUNTER — Ambulatory Visit: Payer: Medicaid Other | Admitting: Cardiology

## 2022-08-06 DIAGNOSIS — I2510 Atherosclerotic heart disease of native coronary artery without angina pectoris: Secondary | ICD-10-CM

## 2022-08-06 DIAGNOSIS — I5043 Acute on chronic combined systolic (congestive) and diastolic (congestive) heart failure: Secondary | ICD-10-CM

## 2022-08-16 ENCOUNTER — Emergency Department (HOSPITAL_COMMUNITY): Payer: Medicaid Other

## 2022-08-16 ENCOUNTER — Inpatient Hospital Stay (HOSPITAL_COMMUNITY)
Admission: EM | Admit: 2022-08-16 | Discharge: 2022-10-09 | DRG: 176 | Disposition: A | Discharge status: Home / Self Care | Payer: Medicaid Other | Attending: Family Medicine | Admitting: Family Medicine

## 2022-08-16 ENCOUNTER — Encounter (HOSPITAL_COMMUNITY): Payer: Self-pay | Admitting: Emergency Medicine

## 2022-08-16 ENCOUNTER — Other Ambulatory Visit: Payer: Self-pay

## 2022-08-16 DIAGNOSIS — Z886 Allergy status to analgesic agent status: Secondary | ICD-10-CM

## 2022-08-16 DIAGNOSIS — F1414 Cocaine abuse with cocaine-induced mood disorder: Secondary | ICD-10-CM | POA: Diagnosis present

## 2022-08-16 DIAGNOSIS — Z91148 Patient's other noncompliance with medication regimen for other reason: Secondary | ICD-10-CM

## 2022-08-16 DIAGNOSIS — Z89611 Acquired absence of right leg above knee: Secondary | ICD-10-CM

## 2022-08-16 DIAGNOSIS — F17210 Nicotine dependence, cigarettes, uncomplicated: Secondary | ICD-10-CM | POA: Diagnosis present

## 2022-08-16 DIAGNOSIS — Z79899 Other long term (current) drug therapy: Secondary | ICD-10-CM

## 2022-08-16 DIAGNOSIS — L97329 Non-pressure chronic ulcer of left ankle with unspecified severity: Secondary | ICD-10-CM | POA: Diagnosis present

## 2022-08-16 DIAGNOSIS — I69392 Facial weakness following cerebral infarction: Secondary | ICD-10-CM

## 2022-08-16 DIAGNOSIS — F1420 Cocaine dependence, uncomplicated: Secondary | ICD-10-CM | POA: Diagnosis present

## 2022-08-16 DIAGNOSIS — I110 Hypertensive heart disease with heart failure: Secondary | ICD-10-CM | POA: Diagnosis present

## 2022-08-16 DIAGNOSIS — R45851 Suicidal ideations: Secondary | ICD-10-CM

## 2022-08-16 DIAGNOSIS — E872 Acidosis, unspecified: Secondary | ICD-10-CM | POA: Diagnosis present

## 2022-08-16 DIAGNOSIS — I2699 Other pulmonary embolism without acute cor pulmonale: Principal | ICD-10-CM | POA: Diagnosis present

## 2022-08-16 DIAGNOSIS — F319 Bipolar disorder, unspecified: Secondary | ICD-10-CM | POA: Diagnosis present

## 2022-08-16 DIAGNOSIS — T45516A Underdosing of anticoagulants, initial encounter: Secondary | ICD-10-CM | POA: Diagnosis present

## 2022-08-16 DIAGNOSIS — F1010 Alcohol abuse, uncomplicated: Secondary | ICD-10-CM | POA: Diagnosis present

## 2022-08-16 DIAGNOSIS — J4489 Other specified chronic obstructive pulmonary disease: Secondary | ICD-10-CM | POA: Diagnosis present

## 2022-08-16 DIAGNOSIS — E11622 Type 2 diabetes mellitus with other skin ulcer: Secondary | ICD-10-CM | POA: Diagnosis present

## 2022-08-16 DIAGNOSIS — I10 Essential (primary) hypertension: Secondary | ICD-10-CM

## 2022-08-16 DIAGNOSIS — Z91010 Allergy to peanuts: Secondary | ICD-10-CM

## 2022-08-16 DIAGNOSIS — Z91128 Patient's intentional underdosing of medication regimen for other reason: Secondary | ICD-10-CM

## 2022-08-16 DIAGNOSIS — F250 Schizoaffective disorder, bipolar type: Secondary | ICD-10-CM | POA: Diagnosis present

## 2022-08-16 DIAGNOSIS — Z7901 Long term (current) use of anticoagulants: Secondary | ICD-10-CM

## 2022-08-16 DIAGNOSIS — Z86718 Personal history of other venous thrombosis and embolism: Secondary | ICD-10-CM

## 2022-08-16 DIAGNOSIS — F1424 Cocaine dependence with cocaine-induced mood disorder: Secondary | ICD-10-CM | POA: Diagnosis present

## 2022-08-16 DIAGNOSIS — Z5900 Homelessness unspecified: Secondary | ICD-10-CM

## 2022-08-16 DIAGNOSIS — I69351 Hemiplegia and hemiparesis following cerebral infarction affecting right dominant side: Secondary | ICD-10-CM

## 2022-08-16 DIAGNOSIS — I5042 Chronic combined systolic (congestive) and diastolic (congestive) heart failure: Secondary | ICD-10-CM | POA: Diagnosis present

## 2022-08-16 DIAGNOSIS — F1210 Cannabis abuse, uncomplicated: Secondary | ICD-10-CM | POA: Diagnosis present

## 2022-08-16 DIAGNOSIS — Z91119 Patient's noncompliance with dietary regimen due to unspecified reason: Secondary | ICD-10-CM

## 2022-08-16 DIAGNOSIS — E1142 Type 2 diabetes mellitus with diabetic polyneuropathy: Secondary | ICD-10-CM | POA: Diagnosis present

## 2022-08-16 DIAGNOSIS — K219 Gastro-esophageal reflux disease without esophagitis: Secondary | ICD-10-CM | POA: Diagnosis present

## 2022-08-16 DIAGNOSIS — Z89412 Acquired absence of left great toe: Secondary | ICD-10-CM

## 2022-08-16 LAB — I-STAT VENOUS BLOOD GAS, ED
Acid-base deficit: 11 mmol/L — ABNORMAL HIGH (ref 0.0–2.0)
Acid-base deficit: 1 mmol/L (ref 0.0–2.0)
Bicarbonate: 11.8 mmol/L — ABNORMAL LOW (ref 20.0–28.0)
Bicarbonate: 23.4 mmol/L (ref 20.0–28.0)
Calcium, Ion: 0.69 mmol/L — CL (ref 1.15–1.40)
Calcium, Ion: 1.07 mmol/L — ABNORMAL LOW (ref 1.15–1.40)
HCT: 25 % — ABNORMAL LOW (ref 39.0–52.0)
HCT: 36 % — ABNORMAL LOW (ref 39.0–52.0)
Hemoglobin: 8.5 g/dL — ABNORMAL LOW (ref 13.0–17.0)
Hemoglobin: 12.2 g/dL — ABNORMAL LOW (ref 13.0–17.0)
O2 Saturation: 92 %
O2 Saturation: 54 %
Potassium: 2.4 mmol/L — CL (ref 3.5–5.1)
Potassium: 3.7 mmol/L (ref 3.5–5.1)
Sodium: 143 mmol/L (ref 135–145)
Sodium: 138 mmol/L (ref 135–145)
TCO2: 12 mmol/L — ABNORMAL LOW (ref 22–32)
TCO2: 24 mmol/L (ref 22–32)
pCO2, Ven: 18.2 mmHg — CL (ref 44–60)
pCO2, Ven: 37.2 mmHg — ABNORMAL LOW (ref 44–60)
pH, Ven: 7.42 (ref 7.25–7.43)
pH, Ven: 7.406 (ref 7.25–7.43)
pO2, Ven: 59 mmHg — ABNORMAL HIGH (ref 32–45)
pO2, Ven: 28 mmHg — CL (ref 32–45)

## 2022-08-16 LAB — COMPREHENSIVE METABOLIC PANEL
ALT: 7 U/L (ref 0–44)
ALT: 9 U/L (ref 0–44)
AST: 10 U/L — ABNORMAL LOW (ref 15–41)
AST: 15 U/L (ref 15–41)
Albumin: 2 g/dL — ABNORMAL LOW (ref 3.5–5.0)
Albumin: 2.8 g/dL — ABNORMAL LOW (ref 3.5–5.0)
Alkaline Phosphatase: 51 U/L (ref 38–126)
Alkaline Phosphatase: 73 U/L (ref 38–126)
Anion gap: 10 (ref 5–15)
Anion gap: 10 (ref 5–15)
BUN: 9 mg/dL (ref 6–20)
BUN: 11 mg/dL (ref 6–20)
CO2: 14 mmol/L — ABNORMAL LOW (ref 22–32)
CO2: 21 mmol/L — ABNORMAL LOW (ref 22–32)
Calcium: 5.7 mg/dL — CL (ref 8.9–10.3)
Calcium: 8.5 mg/dL — ABNORMAL LOW (ref 8.9–10.3)
Chloride: 117 mmol/L — ABNORMAL HIGH (ref 98–111)
Chloride: 105 mmol/L (ref 98–111)
Creatinine, Ser: 0.53 mg/dL — ABNORMAL LOW (ref 0.61–1.24)
Creatinine, Ser: 0.92 mg/dL (ref 0.61–1.24)
GFR, Estimated: >60 mL/min (ref >60)
GFR, Estimated: >60 mL/min (ref >60)
Glucose, Bld: 63 mg/dL — ABNORMAL LOW (ref 70–99)
Glucose, Bld: 116 mg/dL — ABNORMAL HIGH (ref 70–99)
Potassium: 2.4 mmol/L — CL (ref 3.5–5.1)
Potassium: 3.7 mmol/L (ref 3.5–5.1)
Sodium: 141 mmol/L (ref 135–145)
Sodium: 136 mmol/L (ref 135–145)
Total Bilirubin: 0.9 mg/dL (ref 0.3–1.2)
Total Bilirubin: 1.4 mg/dL — ABNORMAL HIGH (ref 0.3–1.2)
Total Protein: 4.5 g/dL — ABNORMAL LOW (ref 6.5–8.1)
Total Protein: 7 g/dL (ref 6.5–8.1)

## 2022-08-16 LAB — CBC WITH DIFFERENTIAL/PLATELET
Abs Immature Granulocytes: 0.01 10*3/uL (ref 0.00–0.07)
Basophils Absolute: 0.1 10*3/uL (ref 0.0–0.1)
Basophils Relative: 1 %
Eosinophils Absolute: 0 10*3/uL (ref 0.0–0.5)
Eosinophils Relative: 1 %
HCT: 34.9 % — ABNORMAL LOW (ref 39.0–52.0)
Hemoglobin: 10.7 g/dL — ABNORMAL LOW (ref 13.0–17.0)
Immature Granulocytes: 0 %
Lymphocytes Relative: 34 %
Lymphs Abs: 1.9 10*3/uL (ref 0.7–4.0)
MCH: 28.8 pg (ref 26.0–34.0)
MCHC: 30.7 g/dL (ref 30.0–36.0)
MCV: 94.1 fL (ref 80.0–100.0)
Monocytes Absolute: 0.5 10*3/uL (ref 0.1–1.0)
Monocytes Relative: 10 %
Neutro Abs: 3 10*3/uL (ref 1.7–7.7)
Neutrophils Relative %: 54 %
Platelets: 250 10*3/uL (ref 150–400)
RBC: 3.71 MIL/uL — ABNORMAL LOW (ref 4.22–5.81)
RDW: 18.5 % — ABNORMAL HIGH (ref 11.5–15.5)
WBC: 5.6 10*3/uL (ref 4.0–10.5)
nRBC: 0 % (ref 0.0–0.2)

## 2022-08-16 LAB — TROPONIN I (HIGH SENSITIVITY)
Troponin I (High Sensitivity): 23 ng/L — ABNORMAL HIGH (ref <18)
Troponin I (High Sensitivity): 26 ng/L — ABNORMAL HIGH (ref <18)

## 2022-08-16 LAB — BRAIN NATRIURETIC PEPTIDE: B Natriuretic Peptide: 1462.5 pg/mL — ABNORMAL HIGH (ref 0.0–100.0)

## 2022-08-16 LAB — MAGNESIUM: Magnesium: 1.8 mg/dL (ref 1.7–2.4)

## 2022-08-16 MED ORDER — HEPARIN (PORCINE) 25000 UT/250ML-% IV SOLN
1400.0000 [IU]/h | INTRAVENOUS | Status: DC
  Administered 2022-08-16: 1400 [IU]/h via INTRAVENOUS
  Filled 2022-08-16: qty 250

## 2022-08-16 MED ORDER — POTASSIUM CHLORIDE 10 MEQ/100ML IV SOLN: 10.0000 meq | INTRAVENOUS | Status: DC

## 2022-08-16 MED ORDER — HEPARIN BOLUS VIA INFUSION
5000.0000 [IU] | Freq: Once | INTRAVENOUS | Status: AC
  Administered 2022-08-16: 5000 [IU] via INTRAVENOUS
  Filled 2022-08-16: qty 5000

## 2022-08-16 MED ORDER — IOHEXOL 350 MG/ML SOLN
120.0000 mL | Freq: Once | INTRAVENOUS | Status: AC | PRN
  Administered 2022-08-16: 120 mL via INTRAVENOUS

## 2022-08-16 MED ORDER — FUROSEMIDE 10 MG/ML IJ SOLN
40.0000 mg | Freq: Once | INTRAMUSCULAR | Status: AC
  Administered 2022-08-16: 40 mg via INTRAVENOUS
  Filled 2022-08-16: qty 4

## 2022-08-16 MED ORDER — MORPHINE SULFATE (PF) 4 MG/ML IV SOLN
4.0000 mg | Freq: Once | INTRAVENOUS | Status: AC
  Administered 2022-08-16: 4 mg via INTRAVENOUS
  Filled 2022-08-16: qty 1

## 2022-08-16 MED ORDER — POTASSIUM CHLORIDE CRYS ER 20 MEQ PO TBCR: 40.0000 meq | EXTENDED_RELEASE_TABLET | Freq: Once | ORAL | Status: DC

## 2022-08-16 NOTE — Progress Notes (Signed)
Patient refuses ABG stick. Patient is moving and flinching once inserted the needle. Although tried positive reinforcement, still unsuccessful. Patient is yelling and asking me to stop. ED RN made aware of refusal.   [name] L. Tamala Julian, BS, RRT-ACCS, RCP

## 2022-08-16 NOTE — ED Triage Notes (Signed)
Pt arrive by [employer] to [facility] ED. It was reported patient is homeless. He c/o chest pain and shortness of breath times two weeks. EMS stated patient have increase swelling in  left leg and have not taken his Diuretic for couple of weeks. Pt use inhaler w/o any relief, and not having any cough. EMS reported he uses cocaine last night and intermittently. EMS said patient felt suicidal today, but he does not have a plan in place and have not tried anything. Pt have wound on left leg and does not have a PCP, because he's homeless.

## 2022-08-16 NOTE — ED Notes (Signed)
Patient transported to CT 

## 2022-08-16 NOTE — ED Notes (Signed)
Report given and care endorsed to Megan RN 

## 2022-08-16 NOTE — H&P (Incomplete)
History and Physical    [name] YIR:[id number] DOB: Nov 24, 1966 DOA: 08/16/2022  PCP: [name], NP  Patient coming from: homeless  I have personally briefly reviewed patient's old medical records in [facility]  Chief Complaint: chest pain / sob   HPI: [name] is a 56 y.o. male with medical history significant of  asthma, bipolar 1 disorder, depression, schizoaffective disorder, chronic congestive heart failure, homelessness, GERD, hypertension, QT prolongation, type 2 diabetes, alcohol abuse, cocaine and cannabis abuse, tobacco use, chronic combined systolic and diastolic heart failure, as well as right AKA. Patient also has interim history of admission 8/12-8/25 with discharged diagnosis of Acute on chronic systolic heart failure. Patient now presents to ED with complaint of sob/lchest pain/ leg swelling  x 2 weeks as well as SI. Per patient states she has not taken his medications in two weeks including eliquis for tx of his DVT.   ED Course:   Vitals: Afeb, bp 152/110, hr 103, rr 34 sat 98% on ra  Vbg: 7.42/59 CE26 Labs: 136, K 3.7, cr 0.92  Wbc5.6, hgb 12.2 Bnp:[id number] ( 611) CTA abd : VASCULAR   Scattered atherosclerotic changes are identified. Normal three-vessel runoff to the left ankle is noted.   Normal runoff is noted on the right despite an amputation at the level of the knee joint.   NONVASCULAR   Soft tissue swelling in the left lower extremity likely related to history of not taking diuretic. No definitive wound is noted.   CTPA 1. Incidental pulmonary embolism, with lobar to segmental embolus present in the superior segment right lower lobe, posterior right upper lobe, and lateral segment right middle lobe. Additional segmental to subsegmental embolus present in the left lower lobe. 2. Multifocal subpleural consolidation and ground-glass airspace opacity seen in the right lung, distal to embolus and consistent pulmonary  infarction. 3. Small right pleural effusion, likely reactive to pulmonary infarction. 4. No evidence of thoracic or abdominal aortic aneurysm, dissection, or other acute aortic pathology. Severe mixed calcific atherosclerosis. 5. Cardiomegaly and coronary artery disease. 6. Emphysema.   Tx lasix 40 mg  iv x 1, heparin drip  Review of Systems: As per HPI otherwise 10 point review of systems negative.   Past Medical History:  Diagnosis Date   Asthma    Bipolar 1 disorder (HCC)    CHF (congestive heart failure) (HCC)    COPD (chronic obstructive pulmonary disease) (HCC)    Depression    Diabetes mellitus without complication (HCC)    GERD (gastroesophageal reflux disease)    Homelessness    Hypertension    QT prolongation    Schizoaffective disorder [facility])     Past Surgical History:  Procedure Laterality Date   AMPUTATION TOE Bilateral 01/02/2021   Procedure: AMPUTATION TOE;  Surgeon: Park Liter, DPM;  Location: [facility] OR;  Service: Podiatry;  Laterality: Bilateral;  Ok to leave patient on hospital bed   IR ANGIO INTRA EXTRACRAN SEL COM CAROTID INNOMINATE BILAT MOD SED  12/17/2020   IR ANGIO VERTEBRAL SEL VERTEBRAL BILAT MOD SED  12/17/2020   RADIOLOGY WITH ANESTHESIA N/A 12/17/2020   Procedure: IR WITH ANESTHESIA;  Surgeon: Radiologist, Medication, MD;  Location: [facility] OR;  Service: Radiology;  Laterality: N/A;     reports that he has been smoking cigarettes. He has never used smokeless tobacco. He reports current alcohol use. He reports current drug use. Drug: Cocaine.  Allergies  Allergen Reactions   Motrin [Ibuprofen] Anaphylaxis   Tylenol [Acetaminophen] Hives,  Nausea And Vomiting and Rash   Lyrica Cr [Pregabalin Er] Other (See Comments)    Dystonia Dysarthria    Peanut-Containing Drug Products Other (See Comments)    Pt denies allergy to peanut butter 03/02/2021    Family History  Problem Relation Age of Onset   Drug abuse Mother    Suicidality Mother    *** Prior  to Admission medications   Medication Sig Start Date End Date Taking? Authorizing Provider  albuterol (VENTOLIN HFA) 108 (90 Base) MCG/ACT inhaler Inhale 2 puffs into the lungs every 6 (six) hours as needed for wheezing or shortness of breath. 07/25/22  Yes Danford, Earl Lites, MD  apixaban (ELIQUIS) 5 MG TABS tablet Take 1 tablet (5 mg total) by mouth 2 (two) times daily. Patient not taking: Reported on 08/16/2022 07/25/22   Alberteen Sam, MD  ARIPiprazole (ABILIFY) 5 MG tablet Take 1 tablet (5 mg total) by mouth daily. Patient not taking: Reported on 08/16/2022 07/25/22   Alberteen Sam, MD  carvedilol (COREG) 3.125 MG tablet Take 1 tablet (3.125 mg total) by mouth 2 (two) times daily with a meal. Patient not taking: Reported on 08/16/2022 07/25/22   Alberteen Sam, MD  folic acid (FOLVITE) 1 MG tablet Take 1 tablet (1 mg total) by mouth daily. Patient not taking: Reported on 08/16/2022 07/25/22   Alberteen Sam, MD  furosemide (LASIX) 40 MG tablet Take 1 tablet (40 mg total) by mouth 2 (two) times daily. Patient not taking: Reported on 08/16/2022 07/25/22   Alberteen Sam, MD  gabapentin (NEURONTIN) 400 MG capsule Take 1 capsule (400 mg total) by mouth 3 (three) times daily. Patient not taking: Reported on 08/16/2022 07/25/22   Alberteen Sam, MD  oxyCODONE (OXY IR/ROXICODONE) 5 MG immediate release tablet Take 1 tablet (5 mg total) by mouth every 8 (eight) hours as needed for severe pain. Patient not taking: Reported on 08/16/2022 07/25/22   Alberteen Sam, MD  potassium chloride SA (KLOR-CON M) 20 MEQ tablet Take 2 tablets (40 mEq total) by mouth daily. Patient not taking: Reported on 08/16/2022 07/25/22   Alberteen Sam, MD  thiamine (VITAMIN B1) 100 MG tablet Take 1 tablet (100 mg total) by mouth daily. Patient not taking: Reported on 08/16/2022 07/25/22   Alberteen Sam, MD    Physical Exam: Vitals:   08/16/22 1615 08/16/22  1800 08/16/22 1915 08/16/22 2045  BP: (!) 147/94 (!) 159/118 (!) 151/113 (!) 157/121  Pulse: 93 (!) 101 97   Resp: (!) 24 (!) 22 (!) 22   Temp:      TempSrc:      SpO2: 100% 90% 100%   Weight:      Height:        Constitutional: NAD, calm, comfortable Vitals:   08/16/22 1615 08/16/22 1800 08/16/22 1915 08/16/22 2045  BP: (!) 147/94 (!) 159/118 (!) 151/113 (!) 157/121  Pulse: 93 (!) 101 97   Resp: (!) 24 (!) 22 (!) 22   Temp:      TempSrc:      SpO2: 100% 90% 100%   Weight:      Height:       Eyes: PERRL, lids and conjunctivae normal ENMT: Mucous membranes are moist. Posterior pharynx clear of any exudate or lesions.Normal dentition.  Neck: normal, supple, no masses, no thyromegaly Respiratory: clear to auscultation bilaterally, no wheezing, no crackles. Normal respiratory effort. No accessory muscle use.  Cardiovascular: Regular rate and rhythm, no murmurs / rubs /  gallops. No extremity edema. 2+ pedal pulses. No carotid bruits.  Abdomen: no tenderness, no masses palpated. No hepatosplenomegaly. Bowel sounds positive.  Musculoskeletal: no clubbing / cyanosis. No joint deformity upper and lower extremities. Good ROM, no contractures. Normal muscle tone.  Skin: no rashes, lesions, ulcers. No induration Neurologic: CN 2-12 grossly intact. Sensation intact, DTR normal. Strength 5/5 in all 4.  Psychiatric: Normal judgment and insight. Alert and oriented x 3. Normal mood.    Labs on Admission: I have personally reviewed following labs and imaging studies  CBC: Recent Labs  Lab 08/16/22 1542 08/16/22 1717 08/16/22 1939  WBC 5.6  --   --   NEUTROABS 3.0  --   --   HGB 10.7* 8.5* 12.2*  HCT 34.9* 25.0* 36.0*  MCV 94.1  --   --   PLT 250  --   --    Basic Metabolic Panel: Recent Labs  Lab 08/16/22 1542 08/16/22 1717 08/16/22 1912 08/16/22 1939  NA 141 143 136 138  K 2.4* 2.4* 3.7 3.7  CL 117*  --  105  --   CO2 14*  --  21*  --   GLUCOSE 63*  --  116*  --   BUN 9   --  11  --   CREATININE 0.53*  --  0.92  --   CALCIUM 5.7*  --  8.5*  --   MG  --   --  1.8  --    GFR: Estimated Creatinine Clearance: 99.8 mL/min (by C-G formula based on SCr of 0.92 mg/dL). Liver Function Tests: Recent Labs  Lab 08/16/22 1542 08/16/22 1912  AST 10* 15  ALT 7 9  ALKPHOS 51 73  BILITOT 0.9 1.4*  PROT 4.5* 7.0  ALBUMIN 2.0* 2.8*   No results for input(s): "LIPASE", "AMYLASE" in the last 168 hours. No results for input(s): "AMMONIA" in the last 168 hours. Coagulation Profile: No results for input(s): "INR", "PROTIME" in the last 168 hours. Cardiac Enzymes: No results for input(s): "CKTOTAL", "CKMB", "CKMBINDEX", "TROPONINI" in the last 168 hours. BNP (last 3 results) No results for input(s): "PROBNP" in the last 8760 hours. HbA1C: No results for input(s): "HGBA1C" in the last 72 hours. CBG: No results for input(s): "GLUCAP" in the last 168 hours. Lipid Profile: No results for input(s): "CHOL", "HDL", "LDLCALC", "TRIG", "CHOLHDL", "LDLDIRECT" in the last 72 hours. Thyroid Function Tests: No results for input(s): "TSH", "T4TOTAL", "FREET4", "T3FREE", "THYROIDAB" in the last 72 hours. Anemia Panel: No results for input(s): "VITAMINB12", "FOLATE", "FERRITIN", "TIBC", "IRON", "RETICCTPCT" in the last 72 hours. Urine analysis:    Component Value Date/Time   COLORURINE COLORLESS (A) 07/13/2022 2346   APPEARANCEUR CLEAR 07/13/2022 2346   LABSPEC 1.005 07/13/2022 2346   PHURINE 6.0 07/13/2022 2346   GLUCOSEU NEGATIVE 07/13/2022 2346   HGBUR NEGATIVE 07/13/2022 2346   BILIRUBINUR NEGATIVE 07/13/2022 2346   KETONESUR NEGATIVE 07/13/2022 2346   PROTEINUR NEGATIVE 07/13/2022 2346   NITRITE NEGATIVE 07/13/2022 2346   LEUKOCYTESUR NEGATIVE 07/13/2022 2346    Radiological Exams on Admission: CT Angio Aortobifemoral W and/or Wo Contrast  Result Date: 08/16/2022 CLINICAL DATA:  Left lower extremity pain EXAM: CT ANGIOGRAPHY OF ABDOMINAL AORTA WITH ILIOFEMORAL  RUNOFF TECHNIQUE: Multidetector CT imaging of the abdomen, pelvis and lower extremities was performed using the standard protocol during bolus administration of intravenous contrast. Multiplanar CT image reconstructions and MIPs were obtained to evaluate the vascular anatomy. RADIATION DOSE REDUCTION: This exam was performed according to the departmental dose-optimization  program which includes automated exposure control, adjustment of the mA and/or kV according to patient size and/or use of iterative reconstruction technique. CONTRAST:  161mL OMNIPAQUE IOHEXOL 350 MG/ML SOLN COMPARISON:  None Available. FINDINGS: VASCULAR Aorta: Atherosclerotic calcifications are noted without evidence of dissection or aneurysmal dilatation. Celiac: Arises in a common trunk with the superior mesenteric artery SMA: Arises in a common trunk with the celiac axis. No focal abnormality is noted. Renals: Both renal arteries are patent without evidence of aneurysm, dissection, vasculitis, fibromuscular dysplasia or significant stenosis. IMA: Patent without evidence of aneurysm, dissection, vasculitis or significant stenosis. RIGHT Lower Extremity Inflow: Diffuse atherosclerotic calcifications are noted throughout the iliac arteries on the right. No focal stenosis is noted. Runoff: Mild stenosis is noted in the right common femoral artery. Superficial femoral artery shows mild atherosclerotic changes. The proximal popliteal artery is patent with atherosclerotic changes. And amputation is noted at the level of the knee joint on the right. LEFT Lower Extremity Inflow: Atherosclerotic calcifications are noted in the iliac system without focal hemodynamically significant stenosis. Runoff: Common femoral artery and femoral bifurcation are patent. Scattered atherosclerotic changes are noted in the left superficial femoral and popliteal arteries. No focal stenosis is noted. Popliteal trifurcation on the left is patent with mild atherosclerotic  calcifications. Three-vessel runoff to the level of the left ankle is noted. Veins: No specific venous abnormality is noted. Review of the MIP images confirms the above findings. Nonvascular: Amputation of the right leg at the knee joint is noted. No specific muscular abnormality is seen. Small knee joint effusions are noted. Mild left lower extremity soft tissue swelling is noted consistent with the given clinical history. No bony abnormality is noted. IMPRESSION: VASCULAR Scattered atherosclerotic changes are identified. Normal three-vessel runoff to the left ankle is noted. Normal runoff is noted on the right despite an amputation at the level of the knee joint. NONVASCULAR Soft tissue swelling in the left lower extremity likely related to history of not taking diuretic. No definitive wound is noted. Electronically Signed   By: Inez Catalina M.D.   On: 08/16/2022 21:49   CT Angio Chest/Abd/Pel for Dissection W and/or W/WO  Result Date: 08/16/2022 CLINICAL DATA:  Rule out dissection, PE, and lower extremity ischemia EXAM: CT ANGIOGRAPHY CHEST, ABDOMEN AND PELVIS TECHNIQUE: Multidetector CT imaging through the chest, abdomen and pelvis was performed using the standard protocol during bolus administration of intravenous contrast. Multiplanar reconstructed images and MIPs were obtained and reviewed to evaluate the vascular anatomy. RADIATION DOSE REDUCTION: This exam was performed according to the departmental dose-optimization program which includes automated exposure control, adjustment of the mA and/or kV according to patient size and/or use of iterative reconstruction technique. CONTRAST:  126mL OMNIPAQUE IOHEXOL 350 MG/ML SOLN COMPARISON:  CT chest angiogram, 03/28/2022 FINDINGS: CTA CHEST FINDINGS VASCULAR Aorta: Satisfactory opacification of the aorta. Normal contour and caliber of the thoracic aorta. No evidence of aneurysm, dissection, or other acute aortic pathology. Moderate mixed aortic  atherosclerosis. Cardiovascular: Incidental pulmonary embolism, with lobar to segmental embolus present in the superior segment right lower lobe, posterior right upper lobe, and lateral segment right middle lobe. Additional segmental to subsegmental embolus present in the left lower lobe. Cardiomegaly. Three-vessel coronary artery calcifications. No pericardial effusion. Review of the MIP images confirms the above findings. NON VASCULAR Mediastinum/Nodes: No enlarged mediastinal, hilar, or axillary lymph nodes. Thyroid gland, trachea, and esophagus demonstrate no significant findings. Lungs/Pleura: Moderate to severe emphysema. Diffuse bilateral bronchial wall thickening. Subpleural consolidation of the anterior  right middle lobe (series 4, image 56) consolidation and ground-glass of the posterior peripheral right upper lobe (series 4, image 40), and subpleural consolidation of the peripheral right lower lobe (series 4, image 65). Small right pleural effusion. Musculoskeletal: No chest wall abnormality. No acute osseous findings. Review of the MIP images confirms the above findings. CTA ABDOMEN AND PELVIS FINDINGS VASCULAR Normal contour and caliber of the abdominal aorta. No evidence of aneurysm, dissection, or other acute aortic pathology. Variant branching anatomy of the abdominal aorta, with a joint celiac and superior mesenteric trunk. Solitary bilateral renal arteries. Severe mixed calcific atherosclerosis. Review of the MIP images confirms the above findings. NON-VASCULAR Hepatobiliary: No solid liver abnormality is seen. No gallstones, gallbladder wall thickening, or biliary dilatation. Pancreas: Unremarkable. No pancreatic ductal dilatation or surrounding inflammatory changes. Spleen: Normal in size without significant abnormality. Adrenals/Urinary Tract: Adrenal glands are unremarkable. Kidneys are normal, without renal calculi, solid lesion, or hydronephrosis. Bladder is unremarkable. Stomach/Bowel:  Stomach is within normal limits. Appendix appears normal. No evidence of bowel wall thickening, distention, or inflammatory changes. Lymphatic: No enlarged abdominal or pelvic lymph nodes. Reproductive: No mass or other significant abnormality. Other: No abdominal wall hernia or abnormality. No ascites. Musculoskeletal: No acute osseous findings. IMPRESSION: 1. Incidental pulmonary embolism, with lobar to segmental embolus present in the superior segment right lower lobe, posterior right upper lobe, and lateral segment right middle lobe. Additional segmental to subsegmental embolus present in the left lower lobe. 2. Multifocal subpleural consolidation and ground-glass airspace opacity seen in the right lung, distal to embolus and consistent pulmonary infarction. 3. Small right pleural effusion, likely reactive to pulmonary infarction. 4. No evidence of thoracic or abdominal aortic aneurysm, dissection, or other acute aortic pathology. Severe mixed calcific atherosclerosis. 5. Cardiomegaly and coronary artery disease. 6. Emphysema. These results were called by telephone at the time of interpretation on 08/16/2022 at 9:45 pm to Dr. Glynn Octave , who verbally acknowledged these results. Aortic Atherosclerosis (ICD10-I70.0) and Emphysema (ICD10-J43.9). Electronically Signed   By: Jearld Lesch M.D.   On: 08/16/2022 21:48   DG Chest Portable 1 View  Result Date: 08/16/2022 CLINICAL DATA:  Shortness of breath. EXAM: PORTABLE CHEST 1 VIEW COMPARISON:  Chest radiograph dated 07/22/2022. FINDINGS: The heart is enlarged. Vascular calcifications are seen in the aortic arch. Mild airspace opacities are seen in the lateral right mid lung, slightly decreased since 07/22/2022. The left lung is clear. There is no pleural effusion or pneumothorax. The osseous structures are unremarkable. IMPRESSION: Airspace opacities in the right mid lung have decreased since 07/22/2022 and are favored to reflect pneumonia. Electronically  Signed   By: Romona Curls M.D.   On: 08/16/2022 16:09    EKG: Independently reviewed.  Assessment/Plan  PE   asthma, bipolar 1 disorder, depression, schizoaffective disorder, chronic congestive heart failure, homelessness, GERD, hypertension, QT prolongation, type 2 diabetes, alcohol abuse, cocaine and cannabis abuse, tobacco use, chronic combined systolic and diastolic heart failure, as well as right AKA. DVT prophylaxis: heparin Code Status: full code  Family Communication: n/a) Disposition Plan: patient  expected to be admitted greater than 2 midnights  Consults called: n/a Admission status: progressive care   Lurline Del MD [facility]   If 7PM-7AM, please contact night-coverage [email or web address] Password [facility]  08/16/2022, 10:48 PM

## 2022-08-16 NOTE — ED Provider Notes (Signed)
[facility] EMERGENCY DEPARTMENT Provider Note   CSN: [id number] Arrival date & time: 08/16/22  1503     History  Chief Complaint  Patient presents with   Shortness of Breath   Chest Pain    Pt arrive by [place] ems to [facility] ED. It was reported patient is homeless. He c/o chest pain and shortness of breath times two weeks. EMS stated patient have increase swelling in  left leg and have not taken his Diuretic for couple of weeks. Pt use inhaler w/o any relief, and not having any cough. EMS reported he uses cocaine last night and intermittently. EMS said patient felt suicidal today, but he does not have a plan in place and have not tried anything. Pt have wound on left leg and do    [name] is a 56 y.o. male. With chest pain and shortness of breath progressed over the past 2 weeks.  States he has not taken any of his medications in the past 2 weeks.  He does admit to using cocaine last night.  His shortness of breath and chest pain is worse with exertion.  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  He denies cough or fever.  Denies urinary symptoms, abdominal pain, nausea, or vomiting. Chest pain is located in the center of his chest and does radiate to the right side. He has associated left lower extremity increasing edema over the past 2 weeks. He has a chronic left lower extremity wound, that he reports is at baseline.  Additionally he endorses suicidal ideations that have been present for about a week.  He does endorse a plan to roll in front of traffic.  Denies suicidal ideations or hallucinations.  Shortness of Breath Associated symptoms: chest pain   Associated symptoms: no abdominal pain, no cough, no ear pain, no fever, no rash, no sore throat, no vomiting and no wheezing   Chest Pain Associated symptoms: shortness of breath   Associated symptoms: no abdominal pain, no back pain, no cough, no fever, no  palpitations and no vomiting        Home Medications Prior to Admission medications   Medication Sig Start Date End Date Taking? Authorizing Provider  albuterol (VENTOLIN HFA) 108 (90 Base) MCG/ACT inhaler Inhale 2 puffs into the lungs every 6 (six) hours as needed for wheezing or shortness of breath. 07/25/22  Yes Danford, Earl Lites, MD  apixaban (ELIQUIS) 5 MG TABS tablet Take 1 tablet (5 mg total) by mouth 2 (two) times daily. Patient not taking: Reported on 08/16/2022 07/25/22   Alberteen Sam, MD  ARIPiprazole (ABILIFY) 5 MG tablet Take 1 tablet (5 mg total) by mouth daily. Patient not taking: Reported on 08/16/2022 07/25/22   Alberteen Sam, MD  carvedilol (COREG) 3.125 MG tablet Take 1 tablet (3.125 mg total) by mouth 2 (two) times daily with a meal. Patient not taking: Reported on 08/16/2022 07/25/22   Alberteen Sam, MD  folic acid (FOLVITE) 1 MG tablet Take 1 tablet (1 mg total) by mouth daily. Patient not taking: Reported on 08/16/2022 07/25/22   Alberteen Sam, MD  furosemide (LASIX) 40 MG tablet Take 1 tablet (40 mg total) by mouth 2 (two) times daily. Patient not taking: Reported on 08/16/2022 07/25/22   Alberteen Sam, MD  gabapentin (NEURONTIN) 400 MG capsule Take 1 capsule (400 mg total) by mouth 3 (three) times daily. Patient not taking: Reported on 08/16/2022 07/25/22  Danford, Earl Lites, MD  oxyCODONE (OXY IR/ROXICODONE) 5 MG immediate release tablet Take 1 tablet (5 mg total) by mouth every 8 (eight) hours as needed for severe pain. Patient not taking: Reported on 08/16/2022 07/25/22   Alberteen Sam, MD  potassium chloride SA (KLOR-CON M) 20 MEQ tablet Take 2 tablets (40 mEq total) by mouth daily. Patient not taking: Reported on 08/16/2022 07/25/22   Alberteen Sam, MD  thiamine (VITAMIN B1) 100 MG tablet Take 1 tablet (100 mg total) by mouth daily. Patient not taking: Reported on 08/16/2022 07/25/22   Alberteen Sam, MD      Allergies    Motrin [ibuprofen], Tylenol [acetaminophen], Lyrica cr [pregabalin er], and Peanut-containing drug products    Review of Systems   Review of Systems  Constitutional:  Negative for chills and fever.  HENT:  Negative for ear pain and sore throat.   Eyes:  Negative for pain and visual disturbance.  Respiratory:  Positive for shortness of breath. Negative for cough and wheezing.   Cardiovascular:  Positive for chest pain and leg swelling. Negative for palpitations.  Gastrointestinal:  Negative for abdominal pain and vomiting.  Genitourinary:  Negative for dysuria and hematuria.  Musculoskeletal:  Positive for gait problem. Negative for arthralgias and back pain.  Skin:  Positive for wound. Negative for color change and rash.  Neurological:  Negative for seizures and syncope.  Psychiatric/Behavioral:  Positive for suicidal ideas.   All other systems reviewed and are negative.   Physical Exam Updated Vital Signs BP (!) 127/109   Pulse 98   Temp 98.1 F (36.7 C) (Oral)   Resp (!) 21   Ht  (1.753 m)   Wt 86.2 kg   SpO2 98%   BMI 28.06 kg/m  Physical Exam Vitals and nursing note reviewed.  Constitutional:      General: He is not in acute distress.    Appearance: He is well-developed. He is ill-appearing.  HENT:     Head: Normocephalic and atraumatic.  Eyes:     Conjunctiva/sclera: Conjunctivae normal.  Cardiovascular:     Rate and Rhythm: Normal rate and regular rhythm.     Heart sounds: No murmur heard. Pulmonary:     Effort: Pulmonary effort is normal. Tachypnea present. No accessory muscle usage or respiratory distress.     Breath sounds: Examination of the left-upper field reveals decreased breath sounds. Examination of the left-middle field reveals decreased breath sounds. Decreased breath sounds present.  Abdominal:     Palpations: Abdomen is soft.     Tenderness: There is no abdominal tenderness.  Musculoskeletal:        General: No  swelling.     Cervical back: Neck supple.     Left lower leg: Tenderness present. Edema present.     Comments: Pitting edema of the left lower extremity up to the knee.  Chronic appearing wounds to medial and lateral ankle on the left lower extremity, with granulated and chronically dark appearing foot/toes  Left lower extremity DP Doppler signal present  Skin:    General: Skin is warm and dry.     Capillary Refill: Capillary refill takes less than 2 seconds.  Neurological:     Mental Status: He is alert.  Psychiatric:        Mood and Affect: Mood normal.        Thought Content: Thought content includes suicidal ideation. Thought content includes suicidal plan.     ED Results / Procedures /  Treatments   Labs (all labs ordered are listed, but only abnormal results are displayed) Labs Reviewed  CBC WITH DIFFERENTIAL/PLATELET - Abnormal; Notable for the following components:      Result Value   RBC 3.71 (*)    Hemoglobin 10.7 (*)    HCT 34.9 (*)    RDW 18.5 (*)    All other components within normal limits  COMPREHENSIVE METABOLIC PANEL - Abnormal; Notable for the following components:   Potassium 2.4 (*)    Chloride 117 (*)    CO2 14 (*)    Glucose, Bld 63 (*)    Creatinine, Ser 0.53 (*)    Calcium 5.7 (*)    Total Protein 4.5 (*)    Albumin 2.0 (*)    AST 10 (*)    All other components within normal limits  BRAIN NATRIURETIC PEPTIDE - Abnormal; Notable for the following components:   B Natriuretic Peptide 1,462.5 (*)    All other components within normal limits  COMPREHENSIVE METABOLIC PANEL - Abnormal; Notable for the following components:   CO2 21 (*)    Glucose, Bld 116 (*)    Calcium 8.5 (*)    Albumin 2.8 (*)    Total Bilirubin 1.4 (*)    All other components within normal limits  I-STAT VENOUS BLOOD GAS, ED - Abnormal; Notable for the following components:   pCO2, Ven 18.2 (*)    pO2, Ven 59 (*)    Bicarbonate 11.8 (*)    TCO2 12 (*)    Acid-base deficit  11.0 (*)    Potassium 2.4 (*)    Calcium, Ion 0.69 (*)    HCT 25.0 (*)    Hemoglobin 8.5 (*)    All other components within normal limits  I-STAT VENOUS BLOOD GAS, ED - Abnormal; Notable for the following components:   pCO2, Ven 37.2 (*)    pO2, Ven 28 (*)    Calcium, Ion 1.07 (*)    HCT 36.0 (*)    Hemoglobin 12.2 (*)    All other components within normal limits  TROPONIN I (HIGH SENSITIVITY) - Abnormal; Notable for the following components:   Troponin I (High Sensitivity) 23 (*)    All other components within normal limits  TROPONIN I (HIGH SENSITIVITY) - Abnormal; Notable for the following components:   Troponin I (High Sensitivity) 26 (*)    All other components within normal limits  MAGNESIUM  BLOOD GAS, VENOUS  HEPARIN LEVEL (UNFRACTIONATED)  CBC  I-STAT CHEM 8, ED    EKG EKG Interpretation  Date/Time:  Saturday August 16 2022 15:09:46 EDT Ventricular Rate:  104 PR Interval:  168 QRS Duration: 101 QT Interval:  412 QTC Calculation: 542 R Axis:   -2 Text Interpretation: Sinus tachycardia Probable left atrial enlargement Left ventricular hypertrophy Nonspecific T abnormalities, lateral leads Prolonged QT interval No significant change was found Confirmed by Glynn Octave [phone]) on 08/16/2022 3:40:11 PM  Radiology CT Angio Aortobifemoral W and/or Wo Contrast  Result Date: 08/16/2022 CLINICAL DATA:  Left lower extremity pain EXAM: CT ANGIOGRAPHY OF ABDOMINAL AORTA WITH ILIOFEMORAL RUNOFF TECHNIQUE: Multidetector CT imaging of the abdomen, pelvis and lower extremities was performed using the standard protocol during bolus administration of intravenous contrast. Multiplanar CT image reconstructions and MIPs were obtained to evaluate the vascular anatomy. RADIATION DOSE REDUCTION: This exam was performed according to the departmental dose-optimization program which includes automated exposure control, adjustment of the mA and/or kV according to patient size and/or use of  iterative reconstruction  technique. CONTRAST:  OMNIPAQUE IOHEXOL 350 MG/ML SOLN COMPARISON:  None Available. FINDINGS: VASCULAR Aorta: Atherosclerotic calcifications are noted without evidence of dissection or aneurysmal dilatation. Celiac: Arises in a common trunk with the superior mesenteric artery SMA: Arises in a common trunk with the celiac axis. No focal abnormality is noted. Renals: Both renal arteries are patent without evidence of aneurysm, dissection, vasculitis, fibromuscular dysplasia or significant stenosis. IMA: Patent without evidence of aneurysm, dissection, vasculitis or significant stenosis. RIGHT Lower Extremity Inflow: Diffuse atherosclerotic calcifications are noted throughout the iliac arteries on the right. No focal stenosis is noted. Runoff: Mild stenosis is noted in the right common femoral artery. Superficial femoral artery shows mild atherosclerotic changes. The proximal popliteal artery is patent with atherosclerotic changes. And amputation is noted at the level of the knee joint on the right. LEFT Lower Extremity Inflow: Atherosclerotic calcifications are noted in the iliac system without focal hemodynamically significant stenosis. Runoff: Common femoral artery and femoral bifurcation are patent. Scattered atherosclerotic changes are noted in the left superficial femoral and popliteal arteries. No focal stenosis is noted. Popliteal trifurcation on the left is patent with mild atherosclerotic calcifications. Three-vessel runoff to the level of the left ankle is noted. Veins: No specific venous abnormality is noted. Review of the MIP images confirms the above findings. Nonvascular: Amputation of the right leg at the knee joint is noted. No specific muscular abnormality is seen. Small knee joint effusions are noted. Mild left lower extremity soft tissue swelling is noted consistent with the given clinical history. No bony abnormality is noted. IMPRESSION: VASCULAR Scattered  atherosclerotic changes are identified. Normal three-vessel runoff to the left ankle is noted. Normal runoff is noted on the right despite an amputation at the level of the knee joint. NONVASCULAR Soft tissue swelling in the left lower extremity likely related to history of not taking diuretic. No definitive wound is noted. Electronically Signed   By: Alcide Clever M.D.   On: 08/16/2022 21:49   CT Angio Chest/Abd/Pel for Dissection W and/or W/WO  Result Date: 08/16/2022 CLINICAL DATA:  Rule out dissection, PE, and lower extremity ischemia EXAM: CT ANGIOGRAPHY CHEST, ABDOMEN AND PELVIS TECHNIQUE: Multidetector CT imaging through the chest, abdomen and pelvis was performed using the standard protocol during bolus administration of intravenous contrast. Multiplanar reconstructed images and MIPs were obtained and reviewed to evaluate the vascular anatomy. RADIATION DOSE REDUCTION: This exam was performed according to the departmental dose-optimization program which includes automated exposure control, adjustment of the mA and/or kV according to patient size and/or use of iterative reconstruction technique. CONTRAST:  OMNIPAQUE IOHEXOL 350 MG/ML SOLN COMPARISON:  CT chest angiogram, 03/28/2022 FINDINGS: CTA CHEST FINDINGS VASCULAR Aorta: Satisfactory opacification of the aorta. Normal contour and caliber of the thoracic aorta. No evidence of aneurysm, dissection, or other acute aortic pathology. Moderate mixed aortic atherosclerosis. Cardiovascular: Incidental pulmonary embolism, with lobar to segmental embolus present in the superior segment right lower lobe, posterior right upper lobe, and lateral segment right middle lobe. Additional segmental to subsegmental embolus present in the left lower lobe. Cardiomegaly. Three-vessel coronary artery calcifications. No pericardial effusion. Review of the MIP images confirms the above findings. NON VASCULAR Mediastinum/Nodes: No enlarged mediastinal, hilar, or axillary  lymph nodes. Thyroid gland, trachea, and esophagus demonstrate no significant findings. Lungs/Pleura: Moderate to severe emphysema. Diffuse bilateral bronchial wall thickening. Subpleural consolidation of the anterior right middle lobe (series 4, image 56) consolidation and ground-glass of the posterior peripheral right upper lobe (series 4, image 40),  and subpleural consolidation of the peripheral right lower lobe (series 4, image 65). Small right pleural effusion. Musculoskeletal: No chest wall abnormality. No acute osseous findings. Review of the MIP images confirms the above findings. CTA ABDOMEN AND PELVIS FINDINGS VASCULAR Normal contour and caliber of the abdominal aorta. No evidence of aneurysm, dissection, or other acute aortic pathology. Variant branching anatomy of the abdominal aorta, with a joint celiac and superior mesenteric trunk. Solitary bilateral renal arteries. Severe mixed calcific atherosclerosis. Review of the MIP images confirms the above findings. NON-VASCULAR Hepatobiliary: No solid liver abnormality is seen. No gallstones, gallbladder wall thickening, or biliary dilatation. Pancreas: Unremarkable. No pancreatic ductal dilatation or surrounding inflammatory changes. Spleen: Normal in size without significant abnormality. Adrenals/Urinary Tract: Adrenal glands are unremarkable. Kidneys are normal, without renal calculi, solid lesion, or hydronephrosis. Bladder is unremarkable. Stomach/Bowel: Stomach is within normal limits. Appendix appears normal. No evidence of bowel wall thickening, distention, or inflammatory changes. Lymphatic: No enlarged abdominal or pelvic lymph nodes. Reproductive: No mass or other significant abnormality. Other: No abdominal wall hernia or abnormality. No ascites. Musculoskeletal: No acute osseous findings. IMPRESSION: 1. Incidental pulmonary embolism, with lobar to segmental embolus present in the superior segment right lower lobe, posterior right upper lobe, and  lateral segment right middle lobe. Additional segmental to subsegmental embolus present in the left lower lobe. 2. Multifocal subpleural consolidation and ground-glass airspace opacity seen in the right lung, distal to embolus and consistent pulmonary infarction. 3. Small right pleural effusion, likely reactive to pulmonary infarction. 4. No evidence of thoracic or abdominal aortic aneurysm, dissection, or other acute aortic pathology. Severe mixed calcific atherosclerosis. 5. Cardiomegaly and coronary artery disease. 6. Emphysema. These results were called by telephone at the time of interpretation on 08/16/2022 at 9:45 pm to Dr. Ezequiel Essex , who verbally acknowledged these results. Aortic Atherosclerosis (ICD10-I70.0) and Emphysema (ICD10-J43.9). Electronically Signed   By: Delanna Ahmadi M.D.   On: 08/16/2022 21:48   DG Chest Portable 1 View  Result Date: 08/16/2022 CLINICAL DATA:  Shortness of breath. EXAM: PORTABLE CHEST 1 VIEW COMPARISON:  Chest radiograph dated 07/22/2022. FINDINGS: The heart is enlarged. Vascular calcifications are seen in the aortic arch. Mild airspace opacities are seen in the lateral right mid lung, slightly decreased since 07/22/2022. The left lung is clear. There is no pleural effusion or pneumothorax. The osseous structures are unremarkable. IMPRESSION: Airspace opacities in the right mid lung have decreased since 07/22/2022 and are favored to reflect pneumonia. Electronically Signed   By: Zerita Boers M.D.   On: 08/16/2022 16:09    Procedures Procedures    Medications Ordered in ED Medications  heparin ADULT infusion 100 units/mL (25000 units/256mL) (1,400 Units/hr Intravenous New Bag/Given 08/16/22 2251)  morphine (PF) 4 MG/ML injection 4 mg (4 mg Intravenous Given 08/16/22 1753)  furosemide (LASIX) injection 40 mg (40 mg Intravenous Given 08/16/22 2036)  iohexol (OMNIPAQUE) 350 MG/ML injection 120 mL (120 mLs Intravenous Contrast Given 08/16/22 2035)  heparin bolus  via infusion 5,000 Units (5,000 Units Intravenous Bolus from Bag 08/16/22 2251)    ED Course/ Medical Decision Making/ A&P                           Medical Decision Making Amount and/or Complexity of Data Reviewed Labs: ordered. Radiology: ordered.  Risk Prescription drug management. Decision regarding hospitalization.   56 year old male with past medical history of CHF EF less than 20%, major depressive disorder, schizoaffective disorder, alcohol  use disorder, COPD, HLD, CAD, substance use, history of DVT, chronic lower extremity wounds, s/p AKA right lower extremity presenting with chest pain, shortness of breath, and lower extremity swelling has been progressive the last 2 weeks in the setting of not taking medications, including Eliquis and Lasix.  On arrival patient is mildly tachycardic to 100 and intermittently tachypneic to respirations high 20s.  He is satting well on room air.  Differential diagnosis includes heart failure exacerbation, pulmonary edema, pneumonia, pleural effusion, ACS, wound infection, DVT, PE, anemia, electrolyte abnormalities, AKI.  Patient did endorse suicidal ideation with plan to roll his wheelchair into the road.  He was placed on suicide precautions, while awaiting medical clearance.  EKG showed sinus tachycardia, rate 104. Normal axis. Qtc prolonged to 542.   Initial blood gas showed pH within normal limits at 7.42 with PCO2 18. Labs notable for potassium 2.5, with nonanion gap metabolic acidosis with bicarb 14. Mag level ordered.  Potassium repletion ordered. Repeat labs ordered. Patient clinically appears fluid overloaded, however will hold off on Lasix at this time while awaiting potassium repletion and recheck of labs.  Recheck of labs was reassuring and patient's potassium now within normal limits, suspect lab error as patient did not receive any supplementation yet.  Lasix ordered.  Chest x-ray does have some opacification on the right side,  however it does appear to be improving compared to prior.  CT negative for dissection, but does have segmental and subsegmental bilateral PE and associated pulmonary infarction. Indeterminate right heart strain due to cardiomegaly.  Heparin ordered. CT of the left lower extremity with runoffs did show normal three-vessel runoff to the left ankle, with soft tissue swelling of the left lower extremity.  Patient admitted to the medicine service for continued management.        Final Clinical Impression(s) / ED Diagnoses Final diagnoses:  Acute pulmonary embolism, unspecified pulmonary embolism type, unspecified whether acute cor pulmonale present [facility])    Rx / DC Orders ED Discharge Orders     None         Kela Millin, MD 08/16/22 2256    Glynn Octave, MD 08/17/22 [phone]

## 2022-08-16 NOTE — Progress Notes (Signed)
ANTICOAGULATION CONSULT NOTE - Initial Consult  Pharmacy Consult for heparin Indication: pulmonary embolus  Allergies  Allergen Reactions   Motrin [Ibuprofen] Anaphylaxis   Tylenol [Acetaminophen] Hives, Nausea And Vomiting and Rash   Lyrica Cr [Pregabalin Er] Other (See Comments)    Dystonia Dysarthria    Peanut-Containing Drug Products Other (See Comments)    Pt denies allergy to peanut butter 03/02/2021    Patient Measurements: Height: 5\' 9"  (175.3 cm) Weight: 86.2 kg (190 lb) IBW/kg (Calculated) : 70.7 Heparin Dosing Weight: TBW  Vital Signs: Temp: 98.1 F (36.7 C) (09/16 1511) Temp Source: Oral (09/16 1511) BP: 157/121 (09/16 2045) Pulse Rate: 97 (09/16 1915)  Labs: Recent Labs    08/16/22 1542 08/16/22 1717 08/16/22 1743 08/16/22 1912 08/16/22 1939  HGB 10.7* 8.5*  --   --  12.2*  HCT 34.9* 25.0*  --   --  36.0*  PLT 250  --   --   --   --   CREATININE 0.53*  --   --  0.92  --   TROPONINIHS 23*  --  26*  --   --     Estimated Creatinine Clearance: 99.8 mL/min (by C-G formula based on SCr of 0.92 mg/dL).   Medical History: Past Medical History:  Diagnosis Date   Asthma    Bipolar 1 disorder (HCC)    CHF (congestive heart failure) (HCC)    COPD (chronic obstructive pulmonary disease) (HCC)    Depression    Diabetes mellitus without complication (HCC)    GERD (gastroesophageal reflux disease)    Homelessness    Hypertension    QT prolongation    Schizoaffective disorder (HCC)     Assessment: 85 YOM presenting with CP ans SOB, PE on CT angio chest.  Hx of DVT on eliquis PTA however pt states he has been out of medicine for at least two weeks.  Goal of Therapy:  Heparin level 0.3-0.7 units/ml Monitor platelets by anticoagulation protocol: Yes   Plan:  Heparin 5000 units IV x 1, and gtt at 1400 units/hr F/u 6 hour heparin level  Bertis Ruddy, PharmD Clinical Pharmacist ED Pharmacist Phone # [phone] 08/16/2022 9:56 PM

## 2022-08-17 ENCOUNTER — Other Ambulatory Visit (HOSPITAL_COMMUNITY): Payer: Medicaid Other

## 2022-08-17 ENCOUNTER — Inpatient Hospital Stay (HOSPITAL_COMMUNITY): Payer: Medicaid Other

## 2022-08-17 DIAGNOSIS — I69351 Hemiplegia and hemiparesis following cerebral infarction affecting right dominant side: Secondary | ICD-10-CM | POA: Diagnosis not present

## 2022-08-17 DIAGNOSIS — Z5900 Homelessness unspecified: Secondary | ICD-10-CM | POA: Diagnosis not present

## 2022-08-17 DIAGNOSIS — F250 Schizoaffective disorder, bipolar type: Secondary | ICD-10-CM | POA: Diagnosis present

## 2022-08-17 DIAGNOSIS — J4489 Other specified chronic obstructive pulmonary disease: Secondary | ICD-10-CM | POA: Diagnosis present

## 2022-08-17 DIAGNOSIS — E872 Acidosis, unspecified: Secondary | ICD-10-CM | POA: Diagnosis present

## 2022-08-17 DIAGNOSIS — E1142 Type 2 diabetes mellitus with diabetic polyneuropathy: Secondary | ICD-10-CM | POA: Diagnosis present

## 2022-08-17 DIAGNOSIS — Z91148 Patient's other noncompliance with medication regimen for other reason: Secondary | ICD-10-CM | POA: Diagnosis not present

## 2022-08-17 DIAGNOSIS — I5042 Chronic combined systolic (congestive) and diastolic (congestive) heart failure: Secondary | ICD-10-CM | POA: Diagnosis present

## 2022-08-17 DIAGNOSIS — F17210 Nicotine dependence, cigarettes, uncomplicated: Secondary | ICD-10-CM | POA: Diagnosis present

## 2022-08-17 DIAGNOSIS — L97329 Non-pressure chronic ulcer of left ankle with unspecified severity: Secondary | ICD-10-CM | POA: Diagnosis present

## 2022-08-17 DIAGNOSIS — I110 Hypertensive heart disease with heart failure: Secondary | ICD-10-CM | POA: Diagnosis present

## 2022-08-17 DIAGNOSIS — E11622 Type 2 diabetes mellitus with other skin ulcer: Secondary | ICD-10-CM | POA: Diagnosis present

## 2022-08-17 DIAGNOSIS — Z89611 Acquired absence of right leg above knee: Secondary | ICD-10-CM | POA: Diagnosis not present

## 2022-08-17 DIAGNOSIS — F1210 Cannabis abuse, uncomplicated: Secondary | ICD-10-CM | POA: Diagnosis present

## 2022-08-17 DIAGNOSIS — I2693 Single subsegmental pulmonary embolism without acute cor pulmonale: Secondary | ICD-10-CM | POA: Diagnosis not present

## 2022-08-17 DIAGNOSIS — K219 Gastro-esophageal reflux disease without esophagitis: Secondary | ICD-10-CM | POA: Diagnosis present

## 2022-08-17 DIAGNOSIS — I2699 Other pulmonary embolism without acute cor pulmonale: Secondary | ICD-10-CM | POA: Diagnosis present

## 2022-08-17 DIAGNOSIS — F1010 Alcohol abuse, uncomplicated: Secondary | ICD-10-CM | POA: Diagnosis present

## 2022-08-17 DIAGNOSIS — Z79899 Other long term (current) drug therapy: Secondary | ICD-10-CM | POA: Diagnosis not present

## 2022-08-17 DIAGNOSIS — Z91010 Allergy to peanuts: Secondary | ICD-10-CM | POA: Diagnosis not present

## 2022-08-17 DIAGNOSIS — Z89412 Acquired absence of left great toe: Secondary | ICD-10-CM | POA: Diagnosis not present

## 2022-08-17 DIAGNOSIS — Z886 Allergy status to analgesic agent status: Secondary | ICD-10-CM | POA: Diagnosis not present

## 2022-08-17 DIAGNOSIS — Z86718 Personal history of other venous thrombosis and embolism: Secondary | ICD-10-CM | POA: Diagnosis not present

## 2022-08-17 DIAGNOSIS — F319 Bipolar disorder, unspecified: Secondary | ICD-10-CM | POA: Diagnosis not present

## 2022-08-17 DIAGNOSIS — R609 Edema, unspecified: Secondary | ICD-10-CM | POA: Diagnosis not present

## 2022-08-17 DIAGNOSIS — R45851 Suicidal ideations: Secondary | ICD-10-CM | POA: Diagnosis present

## 2022-08-17 DIAGNOSIS — F1424 Cocaine dependence with cocaine-induced mood disorder: Secondary | ICD-10-CM | POA: Diagnosis present

## 2022-08-17 DIAGNOSIS — F1414 Cocaine abuse with cocaine-induced mood disorder: Secondary | ICD-10-CM | POA: Diagnosis not present

## 2022-08-17 LAB — URINALYSIS, COMPLETE (UACMP) WITH MICROSCOPIC
Bacteria, UA: NONE SEEN
Bilirubin Urine: NEGATIVE
Glucose, UA: NEGATIVE mg/dL
Hgb urine dipstick: NEGATIVE
Ketones, ur: NEGATIVE mg/dL
Leukocytes,Ua: NEGATIVE
Nitrite: NEGATIVE
Protein, ur: NEGATIVE mg/dL
Specific Gravity, Urine: 1.021 (ref 1.005–1.030)
pH: 5 (ref 5.0–8.0)

## 2022-08-17 LAB — COMPREHENSIVE METABOLIC PANEL
ALT: 7 U/L (ref 0–44)
AST: 17 U/L (ref 15–41)
Albumin: 2.7 g/dL — ABNORMAL LOW (ref 3.5–5.0)
Alkaline Phosphatase: 74 U/L (ref 38–126)
Anion gap: 10 (ref 5–15)
BUN: 9 mg/dL (ref 6–20)
CO2: 24 mmol/L (ref 22–32)
Calcium: 8.3 mg/dL — ABNORMAL LOW (ref 8.9–10.3)
Chloride: 104 mmol/L (ref 98–111)
Creatinine, Ser: 1.02 mg/dL (ref 0.61–1.24)
GFR, Estimated: >60 mL/min (ref >60)
Glucose, Bld: 102 mg/dL — ABNORMAL HIGH (ref 70–99)
Potassium: 3.8 mmol/L (ref 3.5–5.1)
Sodium: 138 mmol/L (ref 135–145)
Total Bilirubin: 1 mg/dL (ref 0.3–1.2)
Total Protein: 6.1 g/dL — ABNORMAL LOW (ref 6.5–8.1)

## 2022-08-17 LAB — CBG MONITORING, ED
Glucose-Capillary: 92 mg/dL (ref 70–99)
Glucose-Capillary: 92 mg/dL (ref 70–99)

## 2022-08-17 LAB — RAPID URINE DRUG SCREEN, HOSP PERFORMED
Amphetamines: NOT DETECTED
Barbiturates: NOT DETECTED
Benzodiazepines: NOT DETECTED
Cocaine: POSITIVE — AB
Opiates: POSITIVE — AB
Tetrahydrocannabinol: NOT DETECTED

## 2022-08-17 LAB — GLUCOSE, CAPILLARY
Glucose-Capillary: 82 mg/dL (ref 70–99)
Glucose-Capillary: 96 mg/dL (ref 70–99)

## 2022-08-17 LAB — HEMOGLOBIN A1C
Hgb A1c MFr Bld: 6 % — ABNORMAL HIGH (ref 4.8–5.6)
Mean Plasma Glucose: 125.5 mg/dL

## 2022-08-17 LAB — ETHANOL: Alcohol, Ethyl (B): <10 mg/dL (ref <10)

## 2022-08-17 LAB — TSH: TSH: 1.974 u[IU]/mL (ref 0.350–4.500)

## 2022-08-17 LAB — MAGNESIUM: Magnesium: 1.8 mg/dL (ref 1.7–2.4)

## 2022-08-17 MED ORDER — THIAMINE MONONITRATE 100 MG PO TABS
100.0000 mg | ORAL_TABLET | Freq: Every day | ORAL | Status: DC
  Administered 2022-08-17 – 2022-09-29 (×33): 100 mg via ORAL
  Filled 2022-08-17 (×50): qty 1

## 2022-08-17 MED ORDER — FUROSEMIDE 40 MG PO TABS
40.0000 mg | ORAL_TABLET | Freq: Two times a day (BID) | ORAL | Status: DC
  Administered 2022-08-17 – 2022-10-09 (×83): 40 mg via ORAL
  Filled 2022-08-17 (×93): qty 1
  Filled 2022-08-17: qty 2
  Filled 2022-08-17 (×6): qty 1

## 2022-08-17 MED ORDER — LISINOPRIL 20 MG PO TABS
20.0000 mg | ORAL_TABLET | Freq: Every day | ORAL | Status: DC
  Administered 2022-08-17 – 2022-08-20 (×4): 20 mg via ORAL
  Filled 2022-08-17 (×4): qty 1

## 2022-08-17 MED ORDER — HYDRALAZINE HCL 20 MG/ML IJ SOLN: 10.0000 mg | Freq: Four times a day (QID) | INTRAMUSCULAR | Status: DC | PRN

## 2022-08-17 MED ORDER — APIXABAN 5 MG PO TABS
10.0000 mg | ORAL_TABLET | Freq: Two times a day (BID) | ORAL | Status: AC
  Administered 2022-08-17 – 2022-08-23 (×14): 10 mg via ORAL
  Filled 2022-08-17 (×14): qty 2

## 2022-08-17 MED ORDER — INSULIN ASPART 100 UNIT/ML IJ SOLN
0.0000 [IU] | Freq: Three times a day (TID) | INTRAMUSCULAR | Status: DC
  Administered 2022-08-18 – 2022-08-20 (×4): 1 [IU] via SUBCUTANEOUS
  Administered 2022-08-20 – 2022-08-22 (×3): 2 [IU] via SUBCUTANEOUS
  Administered 2022-08-22 – 2022-08-24 (×6): 1 [IU] via SUBCUTANEOUS
  Administered 2022-08-27: 2 [IU] via SUBCUTANEOUS
  Administered 2022-08-28: 3 [IU] via SUBCUTANEOUS
  Administered 2022-08-29: 2 [IU] via SUBCUTANEOUS
  Administered 2022-08-30: 1 [IU] via SUBCUTANEOUS
  Administered 2022-08-31 – 2022-09-04 (×6): 2 [IU] via SUBCUTANEOUS
  Administered 2022-09-05 – 2022-09-13 (×5): 1 [IU] via SUBCUTANEOUS
  Administered 2022-09-13: 2 [IU] via SUBCUTANEOUS
  Administered 2022-09-16 – 2022-09-19 (×4): 1 [IU] via SUBCUTANEOUS
  Administered 2022-09-20: 2 [IU] via SUBCUTANEOUS
  Administered 2022-09-21 – 2022-09-28 (×5): 1 [IU] via SUBCUTANEOUS
  Administered 2022-10-02: 2 [IU] via SUBCUTANEOUS
  Administered 2022-10-04 – 2022-10-06 (×3): 1 [IU] via SUBCUTANEOUS
  Administered 2022-10-06 – 2022-10-08 (×4): 2 [IU] via SUBCUTANEOUS
  Administered 2022-10-08: 1 [IU] via SUBCUTANEOUS

## 2022-08-17 MED ORDER — CARVEDILOL 3.125 MG PO TABS
3.1250 mg | ORAL_TABLET | Freq: Two times a day (BID) | ORAL | Status: DC
  Administered 2022-08-17 – 2022-10-09 (×106): 3.125 mg via ORAL
  Filled 2022-08-17 (×107): qty 1

## 2022-08-17 MED ORDER — APIXABAN 5 MG PO TABS
5.0000 mg | ORAL_TABLET | Freq: Two times a day (BID) | ORAL | Status: DC
  Administered 2022-08-24 – 2022-10-09 (×92): 5 mg via ORAL
  Filled 2022-08-17 (×94): qty 1

## 2022-08-17 MED ORDER — FOLIC ACID 1 MG PO TABS
1.0000 mg | ORAL_TABLET | Freq: Every day | ORAL | Status: DC
  Administered 2022-08-17 – 2022-09-20 (×35): 1 mg via ORAL
  Filled 2022-08-17 (×47): qty 1

## 2022-08-17 MED ORDER — GABAPENTIN 400 MG PO CAPS
400.0000 mg | ORAL_CAPSULE | Freq: Three times a day (TID) | ORAL | Status: DC
  Administered 2022-08-17 – 2022-09-24 (×113): 400 mg via ORAL
  Filled 2022-08-17 (×115): qty 1

## 2022-08-17 MED ORDER — HYDRALAZINE HCL 25 MG PO TABS: 25.0000 mg | ORAL_TABLET | Freq: Four times a day (QID) | ORAL | Status: DC | PRN

## 2022-08-17 NOTE — Progress Notes (Signed)
pt grew agitated when informed that there were not any [place] sandwiches in the nourishment fridge, began yelling and verbally abusing Probation officer as well as Museum/gallery curator of lying. Pt informed dinner tray had been ordered, and should be delivered around this time. Pt told writer to go sit down and get out of his business.   Pt has been verbally aggressive toward staff at small intervals throughout the shift, pt snapped at RN earlier when staff was transferring pt from stretcher to bed when he was informed that we would be assisting him in sliding over. Pt yelled at Probation officer when Probation officer collected 11a vitals stating "dont you know I had a stroke" before writer even touched pts right side.   RN aware, will continue to monitor.

## 2022-08-17 NOTE — Progress Notes (Signed)
Pt has short temper, becomes upset easily and is difficult to redirect. Upset with the fact no sandwiches available and the way his temperature was being checked. CN assist with talking with pt and assisting pt getting a meal. Pt calmer at this time. SRP, RN

## 2022-08-17 NOTE — Discharge Instructions (Signed)
Information on my medicine - ELIQUIS (apixaban)  This medication education was reviewed with me or my healthcare representative as part of my discharge preparation.  Why was Eliquis prescribed for you? Eliquis was prescribed to treat blood clots that may have been found in the veins of your legs (deep vein thrombosis) or in your lungs (pulmonary embolism) and to reduce the risk of them occurring again.  What do You need to know about Eliquis ? The starting dose is 10 mg (two 5 mg tablets) taken TWICE daily for the FIRST SEVEN (7) DAYS, then on 08/24/22 the dose is reduced to ONE 5 mg tablet taken TWICE daily.  Eliquis may be taken with or without food.   Try to take the dose about the same time in the morning and in the evening. If you have difficulty swallowing the tablet whole please discuss with your pharmacist how to take the medication safely.  Take Eliquis exactly as prescribed and DO NOT stop taking Eliquis without talking to the doctor who prescribed the medication.  Stopping may increase your risk of developing a new blood clot.  Refill your prescription before you run out.  After discharge, you should have regular check-up appointments with your healthcare provider that is prescribing your Eliquis.    What do you do if you miss a dose? If a dose of ELIQUIS is not taken at the scheduled time, take it as soon as possible on the same day and twice-daily administration should be resumed. The dose should not be doubled to make up for a missed dose.  Important Safety Information A possible side effect of Eliquis is bleeding. You should call your healthcare provider right away if you experience any of the following: Bleeding from an injury or your nose that does not stop. Unusual colored urine (red or dark brown) or unusual colored stools (red or black). Unusual bruising for unknown reasons. A serious fall or if you hit your head (even if there is no bleeding).  Some medicines  may interact with Eliquis and might increase your risk of bleeding or clotting while on Eliquis. To help avoid this, consult your healthcare provider or pharmacist prior to using any new prescription or non-prescription medications, including herbals, vitamins, non-steroidal anti-inflammatory drugs (NSAIDs) and supplements.  This website has more information on Eliquis (apixaban): http://[email or web address].com/[email or web address]

## 2022-08-17 NOTE — Plan of Care (Signed)
  Problem: Education: Goal: Knowledge of General Education information will improve Description Including pain rating scale, medication(s)/side effects and non-pharmacologic comfort measures Outcome: Progressing   Problem: Health Behavior/Discharge Planning: Goal: Ability to manage health-related needs will improve Outcome: Progressing   

## 2022-08-17 NOTE — Progress Notes (Signed)
PROGRESS NOTE    [name]  [id number] DOB: 1966-05-20 DOA: 08/16/2022 PCP: Marliss Coots, NP   Brief Narrative:  [name] is a 56 y.o. male with medical history significant of  asthma, bipolar 1 disorder, depression, schizoaffective disorder, chronic congestive heart failure, homelessness, GERD, hypertension, QT prolongation, type 2 diabetes, alcohol abuse, cocaine and cannabis abuse, tobacco use, chronic combined systolic and diastolic heart failure, as well as right AKA. Patient also has interim history of admission 8/12-8/25 with discharged diagnosis of Acute on chronic systolic heart failure. Patient now presents to ED with complaint of sob/chest pain/ leg swelling  x 2 weeks as well as SI. Per patient states she has not taken his medications in two weeks including eliquis for tx of his DVT. Patient during interview notes no n/v/d and s/p this ,states he is his fine and refused further interview or exam.  So due to this primary history is taken solely from chart.   Assessment & Plan:   Principal Problem:   Pulmonary emboli (HCC) Active Problems:   Cocaine use disorder, severe, dependence (HCC)   Cocaine abuse with cocaine-induced mood disorder ([place])   Homeless   Suicidal ideation   Essential hypertension   Bipolar I disorder (HCC)   Chronic combined systolic and diastolic congestive heart failure (HCC)  Bilateral PE: Patient was started on heparin drip.  However IV infiltrated and patient is not allowing anyone she put another IV line so I will switch him to Eliquis.  Patient is comfortable.  He is not hypoxic.  Echo pending.  Will order Doppler lower extremity.   Hx of combined diastolic/systolic heart failure: Last echo done on 07/14/2022 shows less than 20% ejection fraction and grade 2 diastolic dysfunction.  Patient received Lasix in the ED.  His BNP appears to be slightly elevated than his baseline.  Chest x-ray did not show any pulmonary edema.  He is not  hypoxic.  Continue home dose of Lasix 40 mg p.o. daily.  Left ankle pain: Patient complains of left ankle pain.  He has old and fully healed wound.  He complains of severe tenderness.  He denies or does not recall any trauma. CT left lower ext with runoffs  no obstruction noted, Noted soft tissue swelling.  Will obtain x-ray ankle.  Also getting Doppler lower extremity.  Bipolar d/o/depression/schizoaffective disorder/suicidal ideation: Patient does endorse having suicidal ideation, when asked about any plans, he said he had plans to come in front of the cars on the road.  Currently has one-on-one sitter which we will continue.  Patient is interested in psychiatric evaluation.  I have consulted psychiatry.   Asthma, mild intermittent: Not in acute exacerbation.  Continue bronchodilators.  GERD -ppi    Hypertension, essential: Blood pressure elevated.  This is likely secondary to noncompliance.  Patient has not been taking any of his medications for 2 weeks.  We have resumed his PTA medications which include Coreg.  I have added lisinopril 20 mg as well as as needed hydralazine.   Type 2 diabetes mellitus: Hemoglobin A1c 6.0.  Continue SSI.   Hx-Prolonged QT: QTc 542.  Keep electrolytes within normal range.   Polysubstance abuse/alcohol abuse: UDS positive for cocaine and opioids.  No signs of withdrawal.  Monitor.   Tobacco use: Counseling provided to quit smoking.  Left lower ext swelling - DVT prophylaxis: ELIQUIS   Code Status: Full Code  Family Communication:  None present at bedside.  Plan of care discussed with patient in length  and he/she verbalized understanding and agreed with it.  Status is: Inpatient Remains inpatient appropriate because: Needs evaluation by psychiatry.  More work-up as mentioned above.   Estimated body mass index is 28.06 kg/m as calculated from the following:   Height as of this encounter: 5\' 9"  (1.753 m).   Weight as of this encounter: 86.2 kg.     Nutritional Assessment: Body mass index is 28.06 kg/m.Marland Kitchen Seen by dietician.  I agree with the assessment and plan as outlined below: Nutrition Status:        . Skin Assessment: I have examined the patient's skin and I agree with the wound assessment as performed by the wound care RN as outlined below:    Consultants:  Psychiatry  Procedures:  None  Antimicrobials:  Anti-infectives (From admission, onward)    None         Subjective: Seen and examined.  He complains of left leg pain and mostly in the left ankle.  Denies any trauma.  No other complaint.  No shortness of breath.  Objective: Vitals:   08/17/22 1100 08/17/22 1115 08/17/22 1130 08/17/22 1145  BP: 136/89  (!) 129/98 (!) 130/101  Pulse:    86  Resp:    (!) 22  Temp:    98.3 F (36.8 C)  TempSrc:      SpO2:  96%  96%  Weight:      Height:        Intake/Output Summary (Last 24 hours) at 08/17/2022 1314 Last data filed at 08/17/2022 0700 Gross per 24 hour  Intake 163.15 ml  Output --  Net 163.15 ml   Filed Weights   08/16/22 1543  Weight: 86.2 kg    Examination:  General exam: Appears calm and comfortable  Respiratory system: Clear to auscultation. Respiratory effort normal. Cardiovascular system: S1 & S2 heard, RRR. No JVD, murmurs, rubs, gallops or clicks.  Gastrointestinal system: Abdomen is nondistended, soft and nontender. No organomegaly or masses felt. Normal bowel sounds heard. Central nervous system: Alert and oriented. No focal neurological deficits. Extremities: Right BKA.  Left great toe amputated.  Old healed wound on the left lateral side of the left ankle.  Severe tenderness at the ankle. Skin: No rashes, lesions or ulcers   Data Reviewed: I have personally reviewed following labs and imaging studies  CBC: Recent Labs  Lab 08/16/22 1542 08/16/22 1717 08/16/22 1939  WBC 5.6  --   --   NEUTROABS 3.0  --   --   HGB 10.7* 8.5* 12.2*  HCT 34.9* 25.0* 36.0*  MCV 94.1  --    --   PLT 250  --   --    Basic Metabolic Panel: Recent Labs  Lab 08/16/22 1542 08/16/22 1717 08/16/22 1912 08/16/22 1939 08/17/22 0903  NA 141 143 136 138 138  K 2.4* 2.4* 3.7 3.7 3.8  CL 117*  --  105  --  104  CO2 14*  --  21*  --  24  GLUCOSE 63*  --  116*  --  102*  BUN 9  --  11  --  9  CREATININE 0.53*  --  0.92  --  1.02  CALCIUM 5.7*  --  8.5*  --  8.3*  MG  --   --  1.8  --  1.8   GFR: Estimated Creatinine Clearance: 90.1 mL/min (by C-G formula based on SCr of 1.02 mg/dL). Liver Function Tests: Recent Labs  Lab 08/16/22 1542 08/16/22 1912 08/17/22 [id number]  AST 10* 15 17  ALT 7 9 7   ALKPHOS 51 73 74  BILITOT 0.9 1.4* 1.0  PROT 4.5* 7.0 6.1*  ALBUMIN 2.0* 2.8* 2.7*   No results for input(s): "LIPASE", "AMYLASE" in the last 168 hours. No results for input(s): "AMMONIA" in the last 168 hours. Coagulation Profile: No results for input(s): "INR", "PROTIME" in the last 168 hours. Cardiac Enzymes: No results for input(s): "CKTOTAL", "CKMB", "CKMBINDEX", "TROPONINI" in the last 168 hours. BNP (last 3 results) No results for input(s): "PROBNP" in the last 8760 hours. HbA1C: Recent Labs    08/17/22 0903  HGBA1C 6.0*   CBG: Recent Labs  Lab 08/17/22 0901 08/17/22 1248  GLUCAP 92 92   Lipid Profile: No results for input(s): "CHOL", "HDL", "LDLCALC", "TRIG", "CHOLHDL", "LDLDIRECT" in the last 72 hours. Thyroid Function Tests: Recent Labs    08/17/22 0903  TSH 1.974   Anemia Panel: No results for input(s): "VITAMINB12", "FOLATE", "FERRITIN", "TIBC", "IRON", "RETICCTPCT" in the last 72 hours. Sepsis Labs: No results for input(s): "PROCALCITON", "LATICACIDVEN" in the last 168 hours.  No results found for this or any previous visit (from the past 240 hour(s)).   Radiology Studies: CT Angio Aortobifemoral W and/or Wo Contrast  Result Date: 08/16/2022 CLINICAL DATA:  Left lower extremity pain EXAM: CT ANGIOGRAPHY OF ABDOMINAL AORTA WITH ILIOFEMORAL RUNOFF  TECHNIQUE: Multidetector CT imaging of the abdomen, pelvis and lower extremities was performed using the standard protocol during bolus administration of intravenous contrast. Multiplanar CT image reconstructions and MIPs were obtained to evaluate the vascular anatomy. RADIATION DOSE REDUCTION: This exam was performed according to the departmental dose-optimization program which includes automated exposure control, adjustment of the mA and/or kV according to patient size and/or use of iterative reconstruction technique. CONTRAST:  156mL OMNIPAQUE IOHEXOL 350 MG/ML SOLN COMPARISON:  None Available. FINDINGS: VASCULAR Aorta: Atherosclerotic calcifications are noted without evidence of dissection or aneurysmal dilatation. Celiac: Arises in a common trunk with the superior mesenteric artery SMA: Arises in a common trunk with the celiac axis. No focal abnormality is noted. Renals: Both renal arteries are patent without evidence of aneurysm, dissection, vasculitis, fibromuscular dysplasia or significant stenosis. IMA: Patent without evidence of aneurysm, dissection, vasculitis or significant stenosis. RIGHT Lower Extremity Inflow: Diffuse atherosclerotic calcifications are noted throughout the iliac arteries on the right. No focal stenosis is noted. Runoff: Mild stenosis is noted in the right common femoral artery. Superficial femoral artery shows mild atherosclerotic changes. The proximal popliteal artery is patent with atherosclerotic changes. And amputation is noted at the level of the knee joint on the right. LEFT Lower Extremity Inflow: Atherosclerotic calcifications are noted in the iliac system without focal hemodynamically significant stenosis. Runoff: Common femoral artery and femoral bifurcation are patent. Scattered atherosclerotic changes are noted in the left superficial femoral and popliteal arteries. No focal stenosis is noted. Popliteal trifurcation on the left is patent with mild atherosclerotic  calcifications. Three-vessel runoff to the level of the left ankle is noted. Veins: No specific venous abnormality is noted. Review of the MIP images confirms the above findings. Nonvascular: Amputation of the right leg at the knee joint is noted. No specific muscular abnormality is seen. Small knee joint effusions are noted. Mild left lower extremity soft tissue swelling is noted consistent with the given clinical history. No bony abnormality is noted. IMPRESSION: VASCULAR Scattered atherosclerotic changes are identified. Normal three-vessel runoff to the left ankle is noted. Normal runoff is noted on the right despite an amputation at the level of  the knee joint. NONVASCULAR Soft tissue swelling in the left lower extremity likely related to history of not taking diuretic. No definitive wound is noted. Electronically Signed   By: Inez Catalina M.D.   On: 08/16/2022 21:49   CT Angio Chest/Abd/Pel for Dissection W and/or W/WO  Result Date: 08/16/2022 CLINICAL DATA:  Rule out dissection, PE, and lower extremity ischemia EXAM: CT ANGIOGRAPHY CHEST, ABDOMEN AND PELVIS TECHNIQUE: Multidetector CT imaging through the chest, abdomen and pelvis was performed using the standard protocol during bolus administration of intravenous contrast. Multiplanar reconstructed images and MIPs were obtained and reviewed to evaluate the vascular anatomy. RADIATION DOSE REDUCTION: This exam was performed according to the departmental dose-optimization program which includes automated exposure control, adjustment of the mA and/or kV according to patient size and/or use of iterative reconstruction technique. CONTRAST:  142mL OMNIPAQUE IOHEXOL 350 MG/ML SOLN COMPARISON:  CT chest angiogram, 03/28/2022 FINDINGS: CTA CHEST FINDINGS VASCULAR Aorta: Satisfactory opacification of the aorta. Normal contour and caliber of the thoracic aorta. No evidence of aneurysm, dissection, or other acute aortic pathology. Moderate mixed aortic  atherosclerosis. Cardiovascular: Incidental pulmonary embolism, with lobar to segmental embolus present in the superior segment right lower lobe, posterior right upper lobe, and lateral segment right middle lobe. Additional segmental to subsegmental embolus present in the left lower lobe. Cardiomegaly. Three-vessel coronary artery calcifications. No pericardial effusion. Review of the MIP images confirms the above findings. NON VASCULAR Mediastinum/Nodes: No enlarged mediastinal, hilar, or axillary lymph nodes. Thyroid gland, trachea, and esophagus demonstrate no significant findings. Lungs/Pleura: Moderate to severe emphysema. Diffuse bilateral bronchial wall thickening. Subpleural consolidation of the anterior right middle lobe (series 4, image 56) consolidation and ground-glass of the posterior peripheral right upper lobe (series 4, image 40), and subpleural consolidation of the peripheral right lower lobe (series 4, image 65). Small right pleural effusion. Musculoskeletal: No chest wall abnormality. No acute osseous findings. Review of the MIP images confirms the above findings. CTA ABDOMEN AND PELVIS FINDINGS VASCULAR Normal contour and caliber of the abdominal aorta. No evidence of aneurysm, dissection, or other acute aortic pathology. Variant branching anatomy of the abdominal aorta, with a joint celiac and superior mesenteric trunk. Solitary bilateral renal arteries. Severe mixed calcific atherosclerosis. Review of the MIP images confirms the above findings. NON-VASCULAR Hepatobiliary: No solid liver abnormality is seen. No gallstones, gallbladder wall thickening, or biliary dilatation. Pancreas: Unremarkable. No pancreatic ductal dilatation or surrounding inflammatory changes. Spleen: Normal in size without significant abnormality. Adrenals/Urinary Tract: Adrenal glands are unremarkable. Kidneys are normal, without renal calculi, solid lesion, or hydronephrosis. Bladder is unremarkable. Stomach/Bowel:  Stomach is within normal limits. Appendix appears normal. No evidence of bowel wall thickening, distention, or inflammatory changes. Lymphatic: No enlarged abdominal or pelvic lymph nodes. Reproductive: No mass or other significant abnormality. Other: No abdominal wall hernia or abnormality. No ascites. Musculoskeletal: No acute osseous findings. IMPRESSION: 1. Incidental pulmonary embolism, with lobar to segmental embolus present in the superior segment right lower lobe, posterior right upper lobe, and lateral segment right middle lobe. Additional segmental to subsegmental embolus present in the left lower lobe. 2. Multifocal subpleural consolidation and ground-glass airspace opacity seen in the right lung, distal to embolus and consistent pulmonary infarction. 3. Small right pleural effusion, likely reactive to pulmonary infarction. 4. No evidence of thoracic or abdominal aortic aneurysm, dissection, or other acute aortic pathology. Severe mixed calcific atherosclerosis. 5. Cardiomegaly and coronary artery disease. 6. Emphysema. These results were called by telephone at the time of interpretation on  08/16/2022 at 9:45 pm to Dr. Ezequiel Essex , who verbally acknowledged these results. Aortic Atherosclerosis (ICD10-I70.0) and Emphysema (ICD10-J43.9). Electronically Signed   By: Delanna Ahmadi M.D.   On: 08/16/2022 21:48   DG Chest Portable 1 View  Result Date: 08/16/2022 CLINICAL DATA:  Shortness of breath. EXAM: PORTABLE CHEST 1 VIEW COMPARISON:  Chest radiograph dated 07/22/2022. FINDINGS: The heart is enlarged. Vascular calcifications are seen in the aortic arch. Mild airspace opacities are seen in the lateral right mid lung, slightly decreased since 07/22/2022. The left lung is clear. There is no pleural effusion or pneumothorax. The osseous structures are unremarkable. IMPRESSION: Airspace opacities in the right mid lung have decreased since 07/22/2022 and are favored to reflect pneumonia. Electronically  Signed   By: Zerita Boers M.D.   On: 08/16/2022 16:09    Scheduled Meds:  apixaban  10 mg Oral BID   Followed by   Derrill Memo ON 08/24/2022] apixaban  5 mg Oral BID   carvedilol  3.125 mg Oral BID WC   folic acid  1 mg Oral Daily   furosemide  40 mg Oral BID   gabapentin  400 mg Oral TID   insulin aspart  0-9 Units Subcutaneous TID WC   lisinopril  20 mg Oral Daily   thiamine  100 mg Oral Daily   Continuous Infusions:   LOS: 0 days   Darliss Cheney, MD [facility]  08/17/2022, 1:14 PM   *Please note that this is a verbal dictation therefore any spelling or grammatical errors are due to the "[place]" system interpretation.  Please page via [place] and do not message via secure chat for urgent patient care matters. Secure chat can be used for non urgent patient care matters.  How to contact the [facility] Attending or Consulting provider [place] or covering provider during after hours [place], for this patient?  Check the care team in [facility] and look for a) attending/consulting [facility] provider listed and b) the [facility] team listed. Page or secure chat [phone]. Log into [email or web address] and use [place]'s universal password to access. If you do not have the password, please contact the hospital operator. Locate the [facility] provider you are looking for under [facility] and page to a number that you can be directly reached. If you still have difficulty reaching the provider, please page the [facility] (Director on Call) for the Hospitalists listed on amion for assistance.

## 2022-08-17 NOTE — ED Notes (Signed)
Patient refusing to be stuck by this paramedic/ phlebotomy. Patient starts to yell and get agitated.

## 2022-08-17 NOTE — Progress Notes (Signed)
ANTICOAGULATION CONSULT NOTE - Follow Up  Pharmacy Consult for heparin Indication: pulmonary embolus  Allergies  Allergen Reactions   Motrin [Ibuprofen] Anaphylaxis   Tylenol [Acetaminophen] Hives, Nausea And Vomiting and Rash   Lyrica Cr [Pregabalin Er] Other (See Comments)    Dystonia Dysarthria    Peanut-Containing Drug Products Other (See Comments)    Pt denies allergy to peanut butter 03/02/2021    Patient Measurements: Height: 5\' 9"  (175.3 cm) Weight: 86.2 kg (190 lb) IBW/kg (Calculated) : 70.7 Heparin Dosing Weight: TBW  Vital Signs: Temp: 97.9 F (36.6 C) (09/17 0630) BP: 139/109 (09/17 0630) Pulse Rate: 97 (09/17 0630)  Labs: Recent Labs    08/16/22 1542 08/16/22 1717 08/16/22 1743 08/16/22 1912 08/16/22 1939  HGB 10.7* 8.5*  --   --  12.2*  HCT 34.9* 25.0*  --   --  36.0*  PLT 250  --   --   --   --   CREATININE 0.53*  --   --  0.92  --   TROPONINIHS 23*  --  26*  --   --      Estimated Creatinine Clearance: 99.8 mL/min (by C-G formula based on SCr of 0.92 mg/dL).   Medical History: Past Medical History:  Diagnosis Date   Asthma    Bipolar 1 disorder (HCC)    CHF (congestive heart failure) (HCC)    COPD (chronic obstructive pulmonary disease) (HCC)    Depression    Diabetes mellitus without complication (HCC)    GERD (gastroesophageal reflux disease)    Homelessness    Hypertension    QT prolongation    Schizoaffective disorder (HCC)     Assessment: 35 YOM presenting with CP ans SOB, PE on CT angio chest.  Hx of DVT on eliquis PTA however pt states he has been out of medicine for at least two weeks.  Was refusing lab draws this morning. Plan to transition to Eliquis as unable to safely dose/monitor heparin without lab draws.  Goal of Therapy:  Monitor platelets by anticoagulation protocol: Yes   Plan:  Stop heparin infusion Start Eliquis 10mg  BID x 7 days, then 5mg  BID thereafter  Erskine Speed, PharmD Clinical Pharmacist 08/17/2022  8:12 AM

## 2022-08-18 ENCOUNTER — Inpatient Hospital Stay (HOSPITAL_COMMUNITY): Payer: Medicaid Other

## 2022-08-18 DIAGNOSIS — R609 Edema, unspecified: Secondary | ICD-10-CM

## 2022-08-18 DIAGNOSIS — I2699 Other pulmonary embolism without acute cor pulmonale: Secondary | ICD-10-CM | POA: Diagnosis not present

## 2022-08-18 DIAGNOSIS — I2693 Single subsegmental pulmonary embolism without acute cor pulmonale: Secondary | ICD-10-CM

## 2022-08-18 DIAGNOSIS — F1414 Cocaine abuse with cocaine-induced mood disorder: Secondary | ICD-10-CM | POA: Diagnosis not present

## 2022-08-18 LAB — GLUCOSE, CAPILLARY
Glucose-Capillary: 102 mg/dL — ABNORMAL HIGH (ref 70–99)
Glucose-Capillary: 150 mg/dL — ABNORMAL HIGH (ref 70–99)
Glucose-Capillary: 126 mg/dL — ABNORMAL HIGH (ref 70–99)
Glucose-Capillary: 114 mg/dL — ABNORMAL HIGH (ref 70–99)

## 2022-08-18 LAB — CBC WITH DIFFERENTIAL/PLATELET
Abs Immature Granulocytes: 0.02 10*3/uL (ref 0.00–0.07)
Basophils Absolute: 0 10*3/uL (ref 0.0–0.1)
Basophils Relative: 0 %
Eosinophils Absolute: 0.1 10*3/uL (ref 0.0–0.5)
Eosinophils Relative: 1 %
HCT: 36.6 % — ABNORMAL LOW (ref 39.0–52.0)
Hemoglobin: 12 g/dL — ABNORMAL LOW (ref 13.0–17.0)
Immature Granulocytes: 0 %
Lymphocytes Relative: 21 %
Lymphs Abs: 2 10*3/uL (ref 0.7–4.0)
MCH: 29.4 pg (ref 26.0–34.0)
MCHC: 32.8 g/dL (ref 30.0–36.0)
MCV: 89.7 fL (ref 80.0–100.0)
Monocytes Absolute: 1 10*3/uL (ref 0.1–1.0)
Monocytes Relative: 11 %
Neutro Abs: 6.2 10*3/uL (ref 1.7–7.7)
Neutrophils Relative %: 67 %
Platelets: 256 10*3/uL (ref 150–400)
RBC: 4.08 MIL/uL — ABNORMAL LOW (ref 4.22–5.81)
RDW: 17.9 % — ABNORMAL HIGH (ref 11.5–15.5)
WBC: 9.2 10*3/uL (ref 4.0–10.5)
nRBC: 0 % (ref 0.0–0.2)

## 2022-08-18 LAB — BASIC METABOLIC PANEL
Anion gap: 15 (ref 5–15)
BUN: 11 mg/dL (ref 6–20)
CO2: 17 mmol/L — ABNORMAL LOW (ref 22–32)
Calcium: 8 mg/dL — ABNORMAL LOW (ref 8.9–10.3)
Chloride: 103 mmol/L (ref 98–111)
Creatinine, Ser: 0.86 mg/dL (ref 0.61–1.24)
GFR, Estimated: >60 mL/min (ref >60)
Glucose, Bld: 89 mg/dL (ref 70–99)
Potassium: 4.7 mmol/L (ref 3.5–5.1)
Sodium: 135 mmol/L (ref 135–145)

## 2022-08-18 LAB — ECHOCARDIOGRAM LIMITED
Area-P 1/2: 4.8 cm2
Height: 69 in
Weight: 3040 oz

## 2022-08-18 MED ORDER — ARIPIPRAZOLE 5 MG PO TABS
7.5000 mg | ORAL_TABLET | Freq: Every day | ORAL | Status: DC
  Administered 2022-08-18 – 2022-08-21 (×4): 7.5 mg via ORAL
  Filled 2022-08-18 (×4): qty 2

## 2022-08-18 NOTE — Plan of Care (Signed)
  Problem: Education: Goal: Knowledge of General Education information will improve Description Including pain rating scale, medication(s)/side effects and non-pharmacologic comfort measures Outcome: Progressing   Problem: Nutrition: Goal: Adequate nutrition will be maintained Outcome: Progressing   Problem: Pain Managment: Goal: General experience of comfort will improve Outcome: Progressing   

## 2022-08-18 NOTE — Consult Note (Signed)
[facility] New Face-to-Face Psychiatric Evaluation   Name: [name] DOB: 1966-10-16 MRN: [id number] Service Date: August 18, 2022 LOS:  LOS: 1 day  Reason for Consult: "Suicidal ideation" Referring Provider: Darliss Cheney, MD   Assessment  [name] is a 56 y.o. male admitted medically for 08/16/2022  3:03 PM for chest pain, dyspnea, and suicidal ideation for the past 2 weeks. He carries the psychiatric diagnoses of schizoaffective disorder bipolar type and has a past medical history of T2DM, DVT, chronic combined diastolic systolic heart failure, right AKA, and normocytic anemia.   His current presentation is most consistent with suicidal ideation in the depressive episode of schizoaffective bipolar type, as a sequelae of current physical symptomology. He currently meets criteria for inpatient psychiatric admission based on the fact that he is not psychotic and actively endorsing SI with a plan and intent. However, historically, patient has a history of malingering, and currently has a sense of impending doom from his PE that seems to be the primary driver in his desire to end his life. Current outpatient psychotropic medications include Abilify 5 mg and historically he has had a good response to these medications. He was not compliant with medications prior to admission as evidenced by patient initial reporting; although today, he did state that he has been compliant with the medication with his last dose this morning (Abilify has not been prescribed thus far this admission). Please see plan below for detailed recommendations.   Diagnoses:  Active Hospital problems: Principal Problem:   Pulmonary emboli (HCC) Active Problems:   Cocaine use disorder, severe, dependence (HCC)   Cocaine abuse with cocaine-induced mood disorder ([place])   Homeless   Suicidal ideation   Essential hypertension   Bipolar I disorder (HCC)   Chronic combined systolic and diastolic  congestive heart failure ([place])     Plan  ## Safety and Observation Level:  - Based on my clinical evaluation, I estimate the patient to be at moderate risk of self harm in the current setting - At this time, we recommend continuing 1:1 level of observation. This decision is based on my review of the chart including patient's history and current presentation, interview of the patient, mental status examination, and consideration of suicide risk including evaluating suicidal ideation, plan, intent, suicidal or self-harm behaviors, risk factors, and protective factors. This judgment is based on our ability to directly address suicide risk, implement suicide prevention strategies and develop a safety plan while the patient is in the clinical setting. Please contact our team if there is a concern that risk level has changed.   ## Medications:  -- Increase to Abilify 7.5 mg daily  ## Medical Decision Making Capacity:  Formal decision making capacity was not assessed for any particular decision as part of routine psychiatric evaluation.   ## Further Work-up:  -- Recommend repeat EKG in 1 to 2 days -Recommend maintaining electrolytes, K+ greater than 4 and Mg greater than 2   -- most recent EKG on 9/16 had QtC of 542, but tachycardic and hypokalemic at the time of EKG; corrected QTc Baz[place] 474 -- Pertinent labwork reviewed earlier this admission includes:  UDS positive cocaine and opiates Calcium 8.0, but albumin 2.7 Potassium 2.4 on admission, 4.7 today TSH 1.974 A1c 6.0% Magnesium 1.8   ## Disposition:  -- At this time, would recommend inpatient psychiatric admission, but will reassess as patient becomes more medically stable  ## Behavioral / Environmental:  -- 1:1 sitter -Fall precautions, per primary  team  ##Legal Status Patient currently voluntary  Thank you for this consult request. Recommendations have been communicated to the primary team.  We will continue to follow at this  time.   Rosezetta Schlatter, MD   NEW history  Relevant Aspects of Hospital Course:  Admitted on 08/16/2022 for left lower extremity swelling and dyspnea, found to have PE.  Patient Report:  Patient reports ongoing suicidal ideation with a plan to walk into traffic, which has not improved thus far this admission.  He makes note that his mother completed suicide, as though to ensure that we are aware of a significant risk factor for completion.  He says that he is ready to discharge so that he can complete suicide.  He states that he has been compliant with his abilify, and it does "nothing", although previously endorsed no meds x2 weeks. States he last took it this AM although it is not currently ordered - last filled 8/25. However, when asked about his primary concern and what psychiatry can address for him, he asks about firing a doctor (Dr. Doristine Bosworth) as he feels his complaints (mostly pain) are not addressed. He talks about his struggles with substance use, and his issues maintaining sobriety. He states that he would like to get help with returning to [place] in [place]. We discuss how mood seems to be his primary psychiatric symptom rather than psychosis, but he declines mirtazapine, as his main fear is being sedated. He is OK to increase abilify at this time.     ROS:  Patient reports that over the past couple of weeks, he has had depressed mood with suicidal ideation and decreased energy in light of physical symptoms, but intact appetite despite limited food supply, adequate sleep. He does endorse command hallucinations telling him to rollover in traffic as well as auditory hallucinations telling him that "nobody will miss him." These have both been in accordance and congruent with his depressed mood. As well, he has been using cocaine.   Collateral information:  From EDP on admission With chest pain and shortness of breath progressed over the past 2 weeks.  States he has not taken  any of his medications in the past 2 weeks.  He does admit to using cocaine last night. Additionally he endorses suicidal ideations that have been present for about a week.  He does endorse a plan to roll in front of traffic.  Denies suicidal ideations or hallucinations.  Psychiatric History:  Information collected from patient, chart  Per Patient and old records-Depression, Schizoaffective disorder,hx of bipolar, schizophrenia and cocaine abuse. Pt reports several hospitalizations , more than he is able to count and has had several suicide attempts .   Family psych history: reported mom committed suicide and died but did not say what she did.   Social History:  Homeless, ambulates with walker and wheelchair  Tobacco use: Denies intermittent use, but would not detail amount Alcohol use: Denies current use Drug use: Cocaine use, with last use the night before ED presentation  Family History:   The patient's family history includes Drug abuse in his mother; Suicidality in his mother.  Medical History: Past Medical History:  Diagnosis Date   Asthma    Bipolar 1 disorder (HCC)    CHF (congestive heart failure) (HCC)    COPD (chronic obstructive pulmonary disease) (HCC)    Depression    Diabetes mellitus without complication (HCC)    GERD (gastroesophageal reflux disease)    Homelessness  Hypertension    QT prolongation    Schizoaffective disorder [facility])     Surgical History: Past Surgical History:  Procedure Laterality Date   AMPUTATION TOE Bilateral 01/02/2021   Procedure: AMPUTATION TOE;  Surgeon: Evelina Bucy, DPM;  Location: [facility];  Service: Podiatry;  Laterality: Bilateral;  Ok to leave patient on hospital bed   IR ANGIO INTRA EXTRACRAN SEL COM CAROTID INNOMINATE BILAT MOD SED  12/17/2020   IR ANGIO VERTEBRAL SEL VERTEBRAL BILAT MOD SED  12/17/2020   RADIOLOGY WITH ANESTHESIA N/A 12/17/2020   Procedure: IR WITH ANESTHESIA;  Surgeon: Radiologist, Medication, MD;   Location: [facility];  Service: Radiology;  Laterality: N/A;    Medications:   Current Facility-Administered Medications:    apixaban (ELIQUIS) tablet 10 mg, 10 mg, Oral, BID, 10 mg at 08/18/22 0836 **FOLLOWED BY** [START ON 08/24/2022] apixaban (ELIQUIS) tablet 5 mg, 5 mg, Oral, BID, Bell, Lorin C, RPH   carvedilol (COREG) tablet 3.125 mg, 3.125 mg, Oral, BID WC, Myles Rosenthal A, MD, 3.125 mg at 0000000 [id number]   folic acid (FOLVITE) tablet 1 mg, 1 mg, Oral, Daily, Myles Rosenthal A, MD, 1 mg at 08/18/22 0836   furosemide (LASIX) tablet 40 mg, 40 mg, Oral, BID, Myles Rosenthal A, MD, 40 mg at 08/18/22 0835   gabapentin (NEURONTIN) capsule 400 mg, 400 mg, Oral, TID, Myles Rosenthal A, MD, 400 mg at 08/18/22 0836   hydrALAZINE (APRESOLINE) tablet 25 mg, 25 mg, Oral, Q6H PRN, Pahwani, Ravi, MD   insulin aspart (novoLOG) injection 0-9 Units, 0-9 Units, Subcutaneous, TID WC, Myles Rosenthal A, MD, 1 Units at 08/18/22 1320   lisinopril (ZESTRIL) tablet 20 mg, 20 mg, Oral, Daily, Pahwani, Ravi, MD, 20 mg at 08/18/22 0834   thiamine (VITAMIN B1) tablet 100 mg, 100 mg, Oral, Daily, Myles Rosenthal A, MD, 100 mg at 08/18/22 0849  Allergies: Allergies  Allergen Reactions   Motrin [Ibuprofen] Anaphylaxis   Tylenol [Acetaminophen] Hives, Nausea And Vomiting and Rash   Lyrica Cr [Pregabalin Er] Other (See Comments)    Dystonia Dysarthria    Peanut-Containing Drug Products Other (See Comments)    Pt denies allergy to peanut butter 03/02/2021       Objective  Vital signs:  Temp:  [98.9 F (37.2 C)-99.9 F (37.7 C)] 99.9 F (37.7 C) (09/18 0418) Pulse Rate:  [77-88] 77 (09/18 1200) Resp:  [15-29] 20 (09/18 1200) BP: (96-138)/(61-96) 96/68 (09/18 1200) SpO2:  [90 %-100 %] 90 % (09/18 0814)  Psychiatric Specialty Exam:  Presentation  General Appearance: Appropriate for Environment (In hospital gown)  Eye Contact:Good  Speech:Clear and Coherent; Normal Rate  Speech  Volume:Normal  Handedness:Right   Mood and Affect  Mood:Depressed; Irritable  Affect:Congruent; Depressed   Thought Process  Thought Processes:Coherent; Linear  Descriptions of Associations:Intact  Orientation:Full (Time, Place and Person)  Thought Content:Logical; WDL  History of Schizophrenia/Schizoaffective disorder:Yes  Duration of Psychotic Symptoms:Greater than six months  Hallucinations:Hallucinations: Command; Auditory Description of Command Hallucinations: "Rollover in traffic" Description of Auditory Hallucinations: "Nobody will miss you"  Ideas of Reference:None  Suicidal Thoughts:Suicidal Thoughts: Yes, Active SI Active Intent and/or Plan: With Intent; With Plan; With Means to [place]  Homicidal Thoughts:No data recorded  Sensorium  Memory:Immediate Fair; Recent Fair  Judgment:Poor  Insight:Fair   Executive Functions  Concentration:Fair  Attention Span:Fair  [place]   Psychomotor Activity  Psychomotor Activity:Psychomotor Activity: Normal  Assets  Assets:Communication Skills; Desire for Improvement; Financial Resources/Insurance   Sleep  Sleep:Sleep: Fair   Physical Exam: Physical Exam Vitals reviewed.  Constitutional:      General: He is in acute distress.     Appearance: He is ill-appearing and diaphoretic.  Pulmonary:     Effort: Respiratory distress present.     Comments: Nasal cannula in place Musculoskeletal:     Comments: R AKA  Neurological:     Mental Status: He is alert.     Blood pressure 96/68, pulse 77, temperature 99.9 F (37.7 C), temperature source Oral, resp. rate 20, height 5\' 9"  (1.753 m), weight 86.2 kg, SpO2 90 %. Body mass index is 28.06 kg/m.

## 2022-08-18 NOTE — Progress Notes (Signed)
Pt refused Lasix at [facility], educated on the reason med is given... , stated "I know what its for I am not going to p..... all night. You need to give to me early in the day."  SRP, RN

## 2022-08-18 NOTE — Progress Notes (Signed)
Pt trigger MEWS at times, due to RR, no distress, no changes, will cont to monitor and take VS  q4hrs. SRP,RN

## 2022-08-18 NOTE — Progress Notes (Signed)
Left lower extremity venous duplex has been completed. Preliminary results can be found in CV Proc through chart review.   08/18/22 2:04 PM Carlos Levering RVT

## 2022-08-18 NOTE — Progress Notes (Signed)
PROGRESS NOTE    [name]  [id number] DOB: Nov 08, 1966 DOA: 08/16/2022 PCP: Marliss Coots, NP   Brief Narrative:  [name] is a 56 y.o. male with medical history significant of  asthma, bipolar 1 disorder, depression, schizoaffective disorder, chronic congestive heart failure, homelessness, GERD, hypertension, QT prolongation, type 2 diabetes, alcohol abuse, cocaine and cannabis abuse, tobacco use, chronic combined systolic and diastolic heart failure, as well as right AKA. Patient also has interim history of admission 8/12-8/25 with discharged diagnosis of Acute on chronic systolic heart failure. Patient now presents to ED with complaint of sob/chest pain/ leg swelling  x 2 weeks as well as SI. Per patient states she has not taken his medications in two weeks including eliquis for tx of his DVT. Patient during interview notes no n/v/d and s/p this ,states he is his fine and refused further interview or exam.  So due to this primary history is taken solely from chart.   Assessment & Plan:   Principal Problem:   Pulmonary emboli (HCC) Active Problems:   Cocaine use disorder, severe, dependence (HCC)   Cocaine abuse with cocaine-induced mood disorder ([place])   Homeless   Suicidal ideation   Essential hypertension   Bipolar I disorder (HCC)   Chronic combined systolic and diastolic congestive heart failure (HCC)  Bilateral PE: Patient was started on heparin drip.  However IV infiltrated and patient is not allowing anyone she put another IV line so I will switch him to Eliquis.  Patient is comfortable.  He is not hypoxic.  Lower extremity pending.   Hx of combined diastolic/systolic heart failure: Last echo done on 07/14/2022 shows less than 20% ejection fraction and grade 2 diastolic dysfunction.  Repeat echo during this admission does not show any change. Patient received Lasix in the ED.  His BNP appears to be slightly elevated than his baseline.  Chest x-ray did not show any  pulmonary edema.  He is not hypoxic.  Continue home dose of Lasix 40 mg p.o. daily.  Left ankle pain: Patient complains of left ankle pain.  He has old and fully healed wound.  He complains of severe tenderness.  He denies or does not recall any trauma. CT left lower ext with runoffs  no obstruction noted, Noted soft tissue swelling.  X-ray negative.  Doppler lower extremity pending.  Bipolar d/o/depression/schizoaffective disorder/suicidal ideation: Patient does endorse having suicidal ideation, when asked about any plans, he said he had plans to come in front of the cars on the road.  Currently has one-on-one sitter which we will continue.  Patient is interested in psychiatric evaluation.  I have consulted psychiatry.  Waiting for evaluation.   Asthma, mild intermittent: Not in acute exacerbation.  Continue bronchodilators.  GERD:    Hypertension, essential: Blood pressure low now, very labile overall.  This is likely secondary to noncompliance.  Patient has not been taking any of his medications for 2 weeks.  We have resumed his PTA medications which include Coreg.  I have added lisinopril 20 mg as well as as needed hydralazine.  Will watch closely.  If will then low, will de-escalate some of the medications.   Type 2 diabetes mellitus: Hemoglobin A1c 6.0.  Continue SSI.   Hx-Prolonged QT: QTc 542.  Keep electrolytes within normal range.   Polysubstance abuse/alcohol abuse: UDS positive for cocaine and opioids.  No signs of withdrawal.  Monitor.   Tobacco use: Counseling provided to quit smoking.  Left lower ext swelling: Doppler pending. -  DVT prophylaxis: ELIQUIS   Code Status: Full Code  Family Communication:  None present at bedside.  Plan of care discussed with patient in length and he/she verbalized understanding and agreed with it.  Status is: Inpatient Remains inpatient appropriate because: Needs evaluation by psychiatry.     Estimated body mass index is 28.06 kg/m as  calculated from the following:   Height as of this encounter: 5\' 9"  (1.753 m).   Weight as of this encounter: 86.2 kg.    Nutritional Assessment: Body mass index is 28.06 kg/m.Marland Kitchen Seen by dietician.  I agree with the assessment and plan as outlined below: Nutrition Status:        . Skin Assessment: I have examined the patient's skin and I agree with the wound assessment as performed by the wound care RN as outlined below:    Consultants:  Psychiatry  Procedures:  None  Antimicrobials:  Anti-infectives (From admission, onward)    None         Subjective:  Seen and examined.  Complains of pain in the left lower extremity.  Continues to request to allow double portions of the diet.  Objective: Vitals:   08/18/22 0002 08/18/22 0418 08/18/22 0814 08/18/22 0834  BP:  102/61 96/79 119/73  Pulse:   86 83  Resp:  (!) 22 15   Temp: 98.9 F (37.2 C) 99.9 F (37.7 C)    TempSrc: Oral Oral    SpO2:  98% 90%   Weight:      Height:        Intake/Output Summary (Last 24 hours) at 08/18/2022 1207 Last data filed at 08/18/2022 0900 Gross per 24 hour  Intake 360 ml  Output 1610 ml  Net -1250 ml    Filed Weights   08/16/22 1543  Weight: 86.2 kg    Examination:  General exam: Appears calm and comfortable  Respiratory system: Clear to auscultation. Respiratory effort normal. Cardiovascular system: S1 & S2 heard, RRR. No JVD, murmurs, rubs, gallops or clicks. No pedal edema. Gastrointestinal system: Abdomen is nondistended, soft and nontender. No organomegaly or masses felt. Normal bowel sounds heard. Central nervous system: Alert and oriented. No focal neurological deficits. Extremities: Old healed ulcer at the left ankle.  Tenderness in the left lower extremity. Psychiatry: Judgement and insight appear poor  Data Reviewed: I have personally reviewed following labs and imaging studies  CBC: Recent Labs  Lab 08/16/22 1542 08/16/22 1717 08/16/22 1939  WBC 5.6   --   --   NEUTROABS 3.0  --   --   HGB 10.7* 8.5* 12.2*  HCT 34.9* 25.0* 36.0*  MCV 94.1  --   --   PLT 250  --   --     Basic Metabolic Panel: Recent Labs  Lab 08/16/22 1542 08/16/22 1717 08/16/22 1912 08/16/22 1939 08/17/22 0903 08/18/22 0741  NA 141 143 136 138 138 135  K 2.4* 2.4* 3.7 3.7 3.8 4.7  CL 117*  --  105  --  104 103  CO2 14*  --  21*  --  24 17*  GLUCOSE 63*  --  116*  --  102* 89  BUN 9  --  11  --  9 11  CREATININE 0.53*  --  0.92  --  1.02 0.86  CALCIUM 5.7*  --  8.5*  --  8.3* 8.0*  MG  --   --  1.8  --  1.8  --     GFR: Estimated Creatinine Clearance:  106.8 mL/min (by C-G formula based on SCr of 0.86 mg/dL). Liver Function Tests: Recent Labs  Lab 08/16/22 1542 08/16/22 1912 08/17/22 0903  AST 10* 15 17  ALT 7 9 7   ALKPHOS 51 73 74  BILITOT 0.9 1.4* 1.0  PROT 4.5* 7.0 6.1*  ALBUMIN 2.0* 2.8* 2.7*    No results for input(s): "LIPASE", "AMYLASE" in the last 168 hours. No results for input(s): "AMMONIA" in the last 168 hours. Coagulation Profile: No results for input(s): "INR", "PROTIME" in the last 168 hours. Cardiac Enzymes: No results for input(s): "CKTOTAL", "CKMB", "CKMBINDEX", "TROPONINI" in the last 168 hours. BNP (last 3 results) No results for input(s): "PROBNP" in the last 8760 hours. HbA1C: Recent Labs    08/17/22 0903  HGBA1C 6.0*    CBG: Recent Labs  Lab 08/17/22 1248 08/17/22 1525 08/17/22 2023 08/18/22 0827 08/18/22 1126  GLUCAP 92 82 96 102* 150*    Lipid Profile: No results for input(s): "CHOL", "HDL", "LDLCALC", "TRIG", "CHOLHDL", "LDLDIRECT" in the last 72 hours. Thyroid Function Tests: Recent Labs    08/17/22 0903  TSH 1.974    Anemia Panel: No results for input(s): "VITAMINB12", "FOLATE", "FERRITIN", "TIBC", "IRON", "RETICCTPCT" in the last 72 hours. Sepsis Labs: No results for input(s): "PROCALCITON", "LATICACIDVEN" in the last 168 hours.  No results found for this or any previous visit (from the  past 240 hour(s)).   Radiology Studies: ECHOCARDIOGRAM LIMITED  Result Date: 08/18/2022    ECHOCARDIOGRAM REPORT   Patient Name:   ENAM MCMENAMIN Date of Exam: 08/18/2022 Medical Rec #:  [id number]      Height:       69.0 in Accession #:    [id number]     Weight:       190.0 lb Date of Birth:  16-Sep-1966      BSA:          2.021 m Patient Age:    72 years       BP:           102/61 mmHg Patient Gender: M              HR:           84 bpm. Exam Location:  Inpatient Procedure: Limited Echo, Cardiac Doppler and Color Doppler Indications:    Pulmonary Embolus  History:        Patient has prior history of Echocardiogram examinations, most                 recent 07/14/2022. CHF, Signs/Symptoms:Shortness of Breath and                 Edema; Risk Factors:Dyslipidemia and Hypertension.  Sonographer:    Harvie Junior Referring Phys: [id number] SARA-MAIZ A THOMAS  Sonographer Comments: Image acquisition challenging due to uncooperative patient and agitated. IMPRESSIONS  1. Left ventricular ejection fraction, by estimation, is <20%. The left ventricle has severely decreased function. The left ventricle demonstrates global hypokinesis. Left ventricular diastolic function could not be evaluated.  2. Right ventricular systolic function is moderately reduced. The right ventricular size is mildly enlarged. Tricuspid regurgitation signal is inadequate for assessing PA pressure.  3. Right atrial size was mild to moderately dilated.  4. There is left bowing of the interatrial septum, suggestive of elevated right atrial pressure. No atrial level shunt detected by color flow Doppler.  5. The mitral valve is degenerative. Trivial mitral valve regurgitation. No evidence of mitral stenosis.  6. The aortic valve is tricuspid. Aortic  valve regurgitation is not visualized. Aortic valve sclerosis is present, with no evidence of aortic valve stenosis.  7. The inferior vena cava is normal in size with greater than 50% respiratory variability,  suggesting right atrial pressure of 3 mmHg. FINDINGS  Left Ventricle: Left ventricular ejection fraction, by estimation, is <20%. The left ventricle has severely decreased function. The left ventricle demonstrates global hypokinesis. The left ventricular internal cavity size was normal in size. There is no  left ventricular hypertrophy. Left ventricular diastolic function could not be evaluated. Right Ventricle: The right ventricular size is mildly enlarged. No increase in right ventricular wall thickness. Right ventricular systolic function is moderately reduced. Tricuspid regurgitation signal is inadequate for assessing PA pressure. Left Atrium: Left atrial size was normal in size. Right Atrium: Right atrial size was mild to moderately dilated. Pericardium: There is no evidence of pericardial effusion. Mitral Valve: The mitral valve is degenerative in appearance. Trivial mitral valve regurgitation. No evidence of mitral valve stenosis. Tricuspid Valve: The tricuspid valve is normal in structure. Tricuspid valve regurgitation is mild . No evidence of tricuspid stenosis. Aortic Valve: The aortic valve is tricuspid. Aortic valve regurgitation is not visualized. Aortic valve sclerosis is present, with no evidence of aortic valve stenosis. Pulmonic Valve: The pulmonic valve was normal in structure. Pulmonic valve regurgitation is not visualized. No evidence of pulmonic stenosis. Aorta: The aortic root is normal in size and structure. Venous: The inferior vena cava is normal in size with greater than 50% respiratory variability, suggesting right atrial pressure of 3 mmHg. IAS/Shunts: There is left bowing of the interatrial septum, suggestive of elevated right atrial pressure. No atrial level shunt detected by color flow Doppler.   Diastology LV e' medial:    5.55 cm/s LV E/e' medial:  14.9 LV e' lateral:   10.40 cm/s LV E/e' lateral: 7.9  RIGHT VENTRICLE RV Basal diam:  4.00 cm RV Mid diam:    3.20 cm RV S prime:      8.70 cm/s TAPSE (M-mode): 1.2 cm RIGHT ATRIUM           Index RA Area:     13.20 cm RA Volume:   32.00 ml  15.83 ml/m  MITRAL VALVE MV Area (PHT): 4.80 cm MV Decel Time: 158 msec MV E velocity: 82.50 cm/s MV A velocity: 62.20 cm/s MV E/A ratio:  1.33 Fransico Him MD Electronically signed by Fransico Him MD Signature Date/Time: 08/18/2022/11:38:36 AM    Final    DG Ankle Complete Left  Result Date: 08/17/2022 CLINICAL DATA:  Left ankle pain. EXAM: LEFT ANKLE COMPLETE - 3+ VIEW COMPARISON:  07/12/2022. FINDINGS: No fracture.  No bone lesion. Ankle joint is normally spaced and aligned. Small plantar calcaneal spur. Flattened plantar arch. Previous resection of the great toe. Mild lateral soft tissue swelling. No areas of bone resorption to suggest osteomyelitis. IMPRESSION: 1. No fracture or dislocation.  No evidence of osteomyelitis. Electronically Signed   By: Lajean Manes M.D.   On: 08/17/2022 13:51   CT Angio Aortobifemoral W and/or Wo Contrast  Result Date: 08/16/2022 CLINICAL DATA:  Left lower extremity pain EXAM: CT ANGIOGRAPHY OF ABDOMINAL AORTA WITH ILIOFEMORAL RUNOFF TECHNIQUE: Multidetector CT imaging of the abdomen, pelvis and lower extremities was performed using the standard protocol during bolus administration of intravenous contrast. Multiplanar CT image reconstructions and MIPs were obtained to evaluate the vascular anatomy. RADIATION DOSE REDUCTION: This exam was performed according to the departmental dose-optimization program which includes automated exposure control, adjustment of  the mA and/or kV according to patient size and/or use of iterative reconstruction technique. CONTRAST:  176mL OMNIPAQUE IOHEXOL 350 MG/ML SOLN COMPARISON:  None Available. FINDINGS: VASCULAR Aorta: Atherosclerotic calcifications are noted without evidence of dissection or aneurysmal dilatation. Celiac: Arises in a common trunk with the superior mesenteric artery SMA: Arises in a common trunk with the celiac  axis. No focal abnormality is noted. Renals: Both renal arteries are patent without evidence of aneurysm, dissection, vasculitis, fibromuscular dysplasia or significant stenosis. IMA: Patent without evidence of aneurysm, dissection, vasculitis or significant stenosis. RIGHT Lower Extremity Inflow: Diffuse atherosclerotic calcifications are noted throughout the iliac arteries on the right. No focal stenosis is noted. Runoff: Mild stenosis is noted in the right common femoral artery. Superficial femoral artery shows mild atherosclerotic changes. The proximal popliteal artery is patent with atherosclerotic changes. And amputation is noted at the level of the knee joint on the right. LEFT Lower Extremity Inflow: Atherosclerotic calcifications are noted in the iliac system without focal hemodynamically significant stenosis. Runoff: Common femoral artery and femoral bifurcation are patent. Scattered atherosclerotic changes are noted in the left superficial femoral and popliteal arteries. No focal stenosis is noted. Popliteal trifurcation on the left is patent with mild atherosclerotic calcifications. Three-vessel runoff to the level of the left ankle is noted. Veins: No specific venous abnormality is noted. Review of the MIP images confirms the above findings. Nonvascular: Amputation of the right leg at the knee joint is noted. No specific muscular abnormality is seen. Small knee joint effusions are noted. Mild left lower extremity soft tissue swelling is noted consistent with the given clinical history. No bony abnormality is noted. IMPRESSION: VASCULAR Scattered atherosclerotic changes are identified. Normal three-vessel runoff to the left ankle is noted. Normal runoff is noted on the right despite an amputation at the level of the knee joint. NONVASCULAR Soft tissue swelling in the left lower extremity likely related to history of not taking diuretic. No definitive wound is noted. Electronically Signed   By: Inez Catalina M.D.   On: 08/16/2022 21:49   CT Angio Chest/Abd/Pel for Dissection W and/or W/WO  Result Date: 08/16/2022 CLINICAL DATA:  Rule out dissection, PE, and lower extremity ischemia EXAM: CT ANGIOGRAPHY CHEST, ABDOMEN AND PELVIS TECHNIQUE: Multidetector CT imaging through the chest, abdomen and pelvis was performed using the standard protocol during bolus administration of intravenous contrast. Multiplanar reconstructed images and MIPs were obtained and reviewed to evaluate the vascular anatomy. RADIATION DOSE REDUCTION: This exam was performed according to the departmental dose-optimization program which includes automated exposure control, adjustment of the mA and/or kV according to patient size and/or use of iterative reconstruction technique. CONTRAST:  164mL OMNIPAQUE IOHEXOL 350 MG/ML SOLN COMPARISON:  CT chest angiogram, 03/28/2022 FINDINGS: CTA CHEST FINDINGS VASCULAR Aorta: Satisfactory opacification of the aorta. Normal contour and caliber of the thoracic aorta. No evidence of aneurysm, dissection, or other acute aortic pathology. Moderate mixed aortic atherosclerosis. Cardiovascular: Incidental pulmonary embolism, with lobar to segmental embolus present in the superior segment right lower lobe, posterior right upper lobe, and lateral segment right middle lobe. Additional segmental to subsegmental embolus present in the left lower lobe. Cardiomegaly. Three-vessel coronary artery calcifications. No pericardial effusion. Review of the MIP images confirms the above findings. NON VASCULAR Mediastinum/Nodes: No enlarged mediastinal, hilar, or axillary lymph nodes. Thyroid gland, trachea, and esophagus demonstrate no significant findings. Lungs/Pleura: Moderate to severe emphysema. Diffuse bilateral bronchial wall thickening. Subpleural consolidation of the anterior right middle lobe (series 4, image 56) consolidation  and ground-glass of the posterior peripheral right upper lobe (series 4, image 40), and  subpleural consolidation of the peripheral right lower lobe (series 4, image 65). Small right pleural effusion. Musculoskeletal: No chest wall abnormality. No acute osseous findings. Review of the MIP images confirms the above findings. CTA ABDOMEN AND PELVIS FINDINGS VASCULAR Normal contour and caliber of the abdominal aorta. No evidence of aneurysm, dissection, or other acute aortic pathology. Variant branching anatomy of the abdominal aorta, with a joint celiac and superior mesenteric trunk. Solitary bilateral renal arteries. Severe mixed calcific atherosclerosis. Review of the MIP images confirms the above findings. NON-VASCULAR Hepatobiliary: No solid liver abnormality is seen. No gallstones, gallbladder wall thickening, or biliary dilatation. Pancreas: Unremarkable. No pancreatic ductal dilatation or surrounding inflammatory changes. Spleen: Normal in size without significant abnormality. Adrenals/Urinary Tract: Adrenal glands are unremarkable. Kidneys are normal, without renal calculi, solid lesion, or hydronephrosis. Bladder is unremarkable. Stomach/Bowel: Stomach is within normal limits. Appendix appears normal. No evidence of bowel wall thickening, distention, or inflammatory changes. Lymphatic: No enlarged abdominal or pelvic lymph nodes. Reproductive: No mass or other significant abnormality. Other: No abdominal wall hernia or abnormality. No ascites. Musculoskeletal: No acute osseous findings. IMPRESSION: 1. Incidental pulmonary embolism, with lobar to segmental embolus present in the superior segment right lower lobe, posterior right upper lobe, and lateral segment right middle lobe. Additional segmental to subsegmental embolus present in the left lower lobe. 2. Multifocal subpleural consolidation and ground-glass airspace opacity seen in the right lung, distal to embolus and consistent pulmonary infarction. 3. Small right pleural effusion, likely reactive to pulmonary infarction. 4. No evidence of  thoracic or abdominal aortic aneurysm, dissection, or other acute aortic pathology. Severe mixed calcific atherosclerosis. 5. Cardiomegaly and coronary artery disease. 6. Emphysema. These results were called by telephone at the time of interpretation on 08/16/2022 at 9:45 pm to Dr. Glynn Octave , who verbally acknowledged these results. Aortic Atherosclerosis (ICD10-I70.0) and Emphysema (ICD10-J43.9). Electronically Signed   By: Jearld Lesch M.D.   On: 08/16/2022 21:48   DG Chest Portable 1 View  Result Date: 08/16/2022 CLINICAL DATA:  Shortness of breath. EXAM: PORTABLE CHEST 1 VIEW COMPARISON:  Chest radiograph dated 07/22/2022. FINDINGS: The heart is enlarged. Vascular calcifications are seen in the aortic arch. Mild airspace opacities are seen in the lateral right mid lung, slightly decreased since 07/22/2022. The left lung is clear. There is no pleural effusion or pneumothorax. The osseous structures are unremarkable. IMPRESSION: Airspace opacities in the right mid lung have decreased since 07/22/2022 and are favored to reflect pneumonia. Electronically Signed   By: Romona Curls M.D.   On: 08/16/2022 16:09    Scheduled Meds:  apixaban  10 mg Oral BID   Followed by   Melene Muller ON 08/24/2022] apixaban  5 mg Oral BID   carvedilol  3.125 mg Oral BID WC   folic acid  1 mg Oral Daily   furosemide  40 mg Oral BID   gabapentin  400 mg Oral TID   insulin aspart  0-9 Units Subcutaneous TID WC   lisinopril  20 mg Oral Daily   thiamine  100 mg Oral Daily   Continuous Infusions:   LOS: 1 day   Hughie Closs, MD [facility]  08/18/2022, 12:07 PM   *Please note that this is a verbal dictation therefore any spelling or grammatical errors are due to the "[employer]agon [employer] One" system interpretation.  Please page via Amion and do not message via secure chat for urgent patient  care matters. Secure chat can be used for non urgent patient care matters.  How to contact the [facility] Attending or Consulting  provider [place] or covering provider during after hours [place], for this patient?  Check the care team in [facility] and look for a) attending/consulting [facility] provider listed and b) the [facility] team listed. Page or secure chat [phone]. Log into [email or web address] and use [place]'s universal password to access. If you do not have the password, please contact the hospital operator. Locate the [facility] provider you are looking for under [facility] and page to a number that you can be directly reached. If you still have difficulty reaching the provider, please page the [facility] (Director on Call) for the Hospitalists listed on amion for assistance.

## 2022-08-18 NOTE — TOC Initial Note (Signed)
Transition of Care [facility]) - Initial/Assessment Note    Patient Details  Name: [name] MRN: [id number] Date of Birth: [birth year]  Transition [facility] Care [facility]) CM/SW Contact:    [name] Latin, [name] Phone Number: 08/18/2022, 9:23 AM  Clinical Narrative:                 Pt is well known to [facility] team. Pt is homeless with preference for staying at [facility] and has a history of substance use, is noncompliant with taking his medications and PCP appointments for follow up. Pt has been worked up for SNF and faxed out to several SNF's in the past with no bed offers. Pt has been given information on substance abuse every admission. Pt has a Wheelchair and a walker therefore he is not eligible for any DME for the next 5 years. Per chart review on 07/23/22, order was submitted for an electric wheelchair with [employer]. Patient relies on Social Worker Kelli Hope for assistance.   Expected Discharge Plan: Home/Self Care Barriers to Discharge: Continued Medical Work up   Patient Goals and CMS Choice        Expected Discharge Plan and Services Expected Discharge Plan: Home/Self Care In-house Referral: Clinical Social Work     Living arrangements for the past 2 months: Homeless                                      Prior Living Arrangements/Services Living arrangements for the past 2 months: Homeless Lives with:: Self Patient language and need for interpreter reviewed:: Yes Do you feel safe going back to the place where you live?: Yes      Need for Family Participation in Patient Care: No (Comment) Care giver support system in place?: No (comment) 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) Criminal Activity/Legal Involvement Pertinent to Current Situation/Hospitalization: No - Comment as needed  Activities of Daily Living      Permission Sought/Granted                  Emotional Assessment Appearance:: Appears stated age     Orientation: : Oriented to Self,  Oriented to Place, Oriented to  Time, Oriented to Situation Alcohol / Substance Use: Illicit Drugs Psych Involvement: Yes (comment)  Admission diagnosis:  Pulmonary emboli (HCC) [I26.99] Acute pulmonary embolism, unspecified pulmonary embolism type, unspecified whether acute cor pulmonale present (HCC) [I26.99] Patient Active Problem List   Diagnosis Date Noted   Pulmonary emboli (HCC) 08/17/2022   Venous ulcer (HCC) 07/23/2022   Sinus bradycardia 07/23/2022   Anemia of chronic disease 07/23/2022   S/P AKA (above knee amputation), right (HCC) 07/23/2022   Acute on chronic systolic congestive heart failure (HCC) 07/12/2022   History of DVT (deep vein thrombosis) 07/12/2022   CAP (community acquired pneumonia) 07/12/2022   Chronic venous hypertension (idiopathic) with ulcer and inflammation of bilateral lower extremity (HCC)    Sepsis due to cellulitis (HCC) 04/04/2022   Normocytic anemia 04/04/2022   Acute deep vein thrombosis (DVT) of brachial vein (HCC) 03/31/2022   Swelling of lower extremity 03/23/2022   SIRS (systemic inflammatory response syndrome) (HCC) 03/23/2022   Leg swelling 03/23/2022   Acute on chronic systolic heart failure (HCC) 02/26/2022   Seizure (HCC) 02/26/2022   Venous stasis ulcer of right ankle limited to breakdown of skin with varicose veins (HCC)    Hypertensive urgency 01/29/2022   Prolonged QT interval 01/29/2022  Alcohol abuse 01/29/2022   chronic ulcers 01/29/2022   Wound infection 12/04/2021   Open wounds involving multiple regions of lower extremity 12/03/2021   Mixed diabetic hyperlipidemia associated with type 2 diabetes mellitus ([place]) 12/03/2021   Cellulitis of right lower extremity 11/13/2021   Type 2 diabetes mellitus with diabetic polyneuropathy, without long-term current use of insulin ([place]) 11/13/2021   Antisocial personality disorder ([place]) 11/04/2021   Malingering 11/04/2021   Foot pain 04/23/2021   Chronic combined systolic and diastolic  congestive heart failure ([place]) 03/02/2021   History of stroke 03/02/2021   Polysubstance abuse ([place]) 03/02/2021   Skin ulcer of right great toe with necrosis of bone ([place])    Encounter for assessment of healthcare decision-making capacity    Pain, dental    Dystonia    Dysarthria    Shock liver 67/89/3810   Acute metabolic encephalopathy 17/51/0258   Coronary artery disease involving native coronary artery of native heart without angina pectoris 01/04/2021   Essential hypertension 01/04/2021   Bipolar I disorder ([place]) 01/04/2021   Cocaine abuse ([place]) 01/04/2021   Homelessness 01/04/2021   HLD (hyperlipidemia) 01/04/2021   Osteomyelitis of great toe (HCC)    Ulcer of great toe, left, with necrosis of bone (HCC)    Ulcer of great toe, right, with necrosis of muscle (HCC)    Acute osteomyelitis of toe, left (HCC)    Acute on chronic combined systolic and diastolic CHF (congestive heart failure) ([place]) 12/20/2020   Respiratory failure ([place])    Ischemic stroke ([place]) 12/17/2020   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   Diabetic foot ulcer ([place]) 12/08/2020   CHF (congestive heart failure) ([place]) 11/14/2020   CHF exacerbation ([place]) 11/13/2020   Hypokalemia    Hyperlipidemia    Acute hypoxemic respiratory failure ([place]) 10/26/2020   COPD with acute exacerbation ([place]) 10/14/2020   Acute on chronic combined systolic and diastolic CHF (congestive heart failure) ([place]) 10/02/2020   Heart failure with reduced ejection fraction and diastolic dysfunction ([place]) 09/07/2020   Near syncope 07/21/2020   Acid reflux 06/03/2020   Cannabis abuse 03/19/2020   Acute bronchitis due to Rhinovirus 09/08/2019   Schizoaffective disorder ([place]) 06/24/2019   SOB (shortness of breath)    QT prolongation 05/25/2019   Cocaine abuse ([place])    Elevated troponin    Homeless    Suicidal ideation    Bilateral lower extremity pain 04/05/2019   Housing situation unstable 03/28/2019   Bilateral lower extremity edema     Acute exacerbation of CHF (congestive heart failure) ([place]) 03/04/2019   Alcohol withdrawal syndrome, with delirium ([place]) 05/03/2018   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   Mild intermittent asthma without complication 52/77/8242   Asthma 12/26/2016   Tobacco use disorder 12/26/2016   Schizophrenia ([place]) 12/26/2016   Severe episode of recurrent major depressive disorder, with psychotic features ([name]) 10/31/2016   Cocaine abuse with cocaine-induced mood disorder ([place]) 09/06/2016   Alcohol use disorder, mild, abuse 07/31/2016   Substance induced mood disorder ([place]) 07/30/2016   Schizoaffective disorder, bipolar type ([place]) 05/31/2016   Cocaine use disorder, severe, dependence ([place]) 05/31/2016   Major depressive disorder, recurrent episode ([employer]) 05/30/2016   Suicidal behavior with attempted self-injury ([place]) 05/08/2016   Acetaminophen overdose 05/06/2016   PCP:  Marliss Coots, NP Pharmacy:   [facility] [place] [address] [place] [address] Phone: [phone] Fax: [facility]  [name] [address] Phone: [phone] Fax: [phone]     Social Determinants of Health (SDOH) Interventions    Readmission Risk Interventions    08/18/2022    9:00 AM 07/18/2022    4:11 PM 04/08/2022    1:43 PM  Readmission Risk Prevention Plan  Transportation Screening Complete Complete Complete  Medication Review Oceanographer) Complete Complete Complete  PCP or Specialist appointment within 3-5 days of discharge Complete Complete Complete  HRI or Home Care Consult Complete Complete Not Complete  HRI or Home Care Consult Pt Refusal Comments   Pt is homeless  SW Recovery Care/Counseling Consult Complete Complete Complete  Palliative Care Screening Not Applicable  Not Applicable  Skilled Nursing Facility Not Applicable Complete Not Applicable

## 2022-08-19 DIAGNOSIS — F1414 Cocaine abuse with cocaine-induced mood disorder: Secondary | ICD-10-CM | POA: Diagnosis not present

## 2022-08-19 DIAGNOSIS — I2699 Other pulmonary embolism without acute cor pulmonale: Secondary | ICD-10-CM | POA: Diagnosis not present

## 2022-08-19 LAB — GLUCOSE, CAPILLARY
Glucose-Capillary: 149 mg/dL — ABNORMAL HIGH (ref 70–99)
Glucose-Capillary: 91 mg/dL (ref 70–99)
Glucose-Capillary: 113 mg/dL — ABNORMAL HIGH (ref 70–99)
Glucose-Capillary: 124 mg/dL — ABNORMAL HIGH (ref 70–99)

## 2022-08-19 LAB — TROPONIN I (HIGH SENSITIVITY)
Troponin I (High Sensitivity): 22 ng/L — ABNORMAL HIGH (ref <18)
Troponin I (High Sensitivity): 20 ng/L — ABNORMAL HIGH (ref <18)

## 2022-08-19 MED ORDER — OXYCODONE HCL 5 MG PO TABS
2.5000 mg | ORAL_TABLET | Freq: Once | ORAL | Status: AC
  Administered 2022-08-19: 2.5 mg via ORAL
  Filled 2022-08-19: qty 1

## 2022-08-19 MED ORDER — ACETAMINOPHEN 325 MG PO TABS
650.0000 mg | ORAL_TABLET | Freq: Four times a day (QID) | ORAL | Status: DC | PRN
  Filled 2022-08-19: qty 2

## 2022-08-19 NOTE — Progress Notes (Addendum)
PROGRESS NOTE    [name]  [id number] DOB: 05-08-1966 DOA: 08/16/2022 PCP: Marliss Coots, NP   Brief Narrative:  [name] is a 56 y.o. male with medical history significant of  asthma, bipolar 1 disorder, depression, schizoaffective disorder, chronic congestive heart failure, homelessness, GERD, hypertension, QT prolongation, type 2 diabetes, alcohol abuse, cocaine and cannabis abuse, tobacco use, chronic combined systolic and diastolic heart failure, as well as right AKA. Patient also has interim history of admission 8/12-8/25 with discharged diagnosis of Acute on chronic systolic heart failure. Patient now presents to ED with complaint of sob/chest pain/ leg swelling  x 2 weeks as well as SI. Per patient states she has not taken his medications in two weeks including eliquis for tx of his DVT. Patient during interview notes no n/v/d and s/p this ,states he is his fine and refused further interview or exam.  So due to this primary history is taken solely from chart.   Assessment & Plan:   Principal Problem:   Pulmonary emboli (HCC) Active Problems:   Cocaine use disorder, severe, dependence (HCC)   Cocaine abuse with cocaine-induced mood disorder ([place])   Homeless   Suicidal ideation   Essential hypertension   Bipolar I disorder (HCC)   Chronic combined systolic and diastolic congestive heart failure (HCC)  Bilateral PE: Patient was started on heparin drip.  However IV infiltrated and patient was not allowing anyone she put another IV line so he was transitioned to Eliquis.  Patient is comfortable.  He is not hypoxic.  DVT ruled out.   Hx of combined diastolic/systolic heart failure: Last echo done on 07/14/2022 shows less than 20% ejection fraction and grade 2 diastolic dysfunction.  Repeat echo during this admission does not show any change. Patient received Lasix in the ED.  His BNP appears to be slightly elevated than his baseline.  Chest x-ray did not show any  pulmonary edema.  He is not hypoxic.  Continue home dose of Lasix 40 mg p.o. daily.  Left ankle pain: Patient complained of left ankle pain.  He has old and fully healed wound.  He denies or does not recall any trauma. CT left lower ext with runoffs  no obstruction noted, Noted soft tissue swelling.  X-ray negative.  Doppler neg for DVT as well.  Bipolar d/o/depression/schizoaffective disorder/suicidal ideation: Patient does endorse having suicidal ideation, when asked about any plans, he said he had plans to come in front of the cars on the road.  Currently has one-on-one sitter which we will continue.  Patient is interested in psychiatric evaluation.  Psychiatry has seen the patient as well.  They are now recommending inpatient psychiatry admission.  TOC is working on finding a placement.  We will continue one-on-one sitter.  Patient is moderate to high risk to harm himself.   Asthma, mild intermittent: Not in acute exacerbation.  Continue bronchodilators.  GERD:    Hypertension, essential: Blood pressure low now, very labile overall.  This is likely secondary to noncompliance.  Patient has not been taking any of his medications for 2 weeks.  We have resumed his PTA medications which include Coreg.  I have added lisinopril 20 mg as well as as needed hydralazine.  Will watch closely.    Type 2 diabetes mellitus: Hemoglobin A1c 6.0.  Continue SSI.   Hx-Prolonged QT: QTc 542.  Keep electrolytes within normal range.   Polysubstance abuse/alcohol abuse: UDS positive for cocaine and opioids.  No signs of withdrawal.  Monitor.  Tobacco use: Counseling provided to quit smoking. - DVT prophylaxis: ELIQUIS   Code Status: Full Code  Family Communication:  None present at bedside.  Plan of care discussed with patient in length and he/she verbalized understanding and agreed with it.  Status is: Inpatient Remains inpatient appropriate because: Patient medically stable for discharge.  I have relayed  this information to psychiatry.  They are recommending inpatient psychiatry admission.  TOC working on that.   Estimated body mass index is 28.06 kg/m as calculated from the following:   Height as of this encounter: 5\' 9"  (1.753 m).   Weight as of this encounter: 86.2 kg.    Nutritional Assessment: Body mass index is 28.06 kg/m.Marland Kitchen Seen by dietician.  I agree with the assessment and plan as outlined below: Nutrition Status:        . Skin Assessment: I have examined the patient's skin and I agree with the wound assessment as performed by the wound care RN as outlined below:    Consultants:  Psychiatry  Procedures:  None  Antimicrobials:  Anti-infectives (From admission, onward)    None         Subjective:  Seen and examined.  Once again, his only complaint today was to allow him to have double portions of the meals.  I had explained to him yesterday and again today that it is not up to me.  He became upset once again today and asked me to leave the room.  Objective: Vitals:   08/19/22 0740 08/19/22 0832 08/19/22 0835 08/19/22 1202  BP: (!) 118/92 126/75 126/75 (!) 121/90  Pulse: 82 78  78  Resp: 20   16  Temp: 98.3 F (36.8 C)   98.5 F (36.9 C)  TempSrc: Oral   Oral  SpO2: 97%   95%  Weight:      Height:        Intake/Output Summary (Last 24 hours) at 08/19/2022 1322 Last data filed at 08/19/2022 1127 Gross per 24 hour  Intake 1800 ml  Output 3875 ml  Net -2075 ml    Filed Weights   08/16/22 1543  Weight: 86.2 kg    Examination:  General exam: Appears agitated. Respiratory system: Clear to auscultation. Respiratory effort normal. Cardiovascular system: S1 & S2 heard, RRR. No JVD, murmurs, rubs, gallops or clicks. No pedal edema. Gastrointestinal system: Abdomen is nondistended, soft and nontender. No organomegaly or masses felt. Normal bowel sounds heard. Central nervous system: Alert and oriented. No focal neurological deficits. Extremities:  Symmetric 5 x 5 power. Skin: No rashes, lesions or ulcers.  Psychiatry: Judgement and insight appear poor  Data Reviewed: I have personally reviewed following labs and imaging studies  CBC: Recent Labs  Lab 08/16/22 1542 08/16/22 1717 08/16/22 1939 08/18/22 1449  WBC 5.6  --   --  9.2  NEUTROABS 3.0  --   --  6.2  HGB 10.7* 8.5* 12.2* 12.0*  HCT 34.9* 25.0* 36.0* 36.6*  MCV 94.1  --   --  89.7  PLT 250  --   --  123456    Basic Metabolic Panel: Recent Labs  Lab 08/16/22 1542 08/16/22 1717 08/16/22 1912 08/16/22 1939 08/17/22 0903 08/18/22 0741  NA 141 143 136 138 138 135  K 2.4* 2.4* 3.7 3.7 3.8 4.7  CL 117*  --  105  --  104 103  CO2 14*  --  21*  --  24 17*  GLUCOSE 63*  --  116*  --  102*  89  BUN 9  --  11  --  9 11  CREATININE 0.53*  --  0.92  --  1.02 0.86  CALCIUM 5.7*  --  8.5*  --  8.3* 8.0*  MG  --   --  1.8  --  1.8  --     GFR: Estimated Creatinine Clearance: 106.8 mL/min (by C-G formula based on SCr of 0.86 mg/dL). Liver Function Tests: Recent Labs  Lab 08/16/22 1542 08/16/22 1912 08/17/22 0903  AST 10* 15 17  ALT 7 9 7   ALKPHOS 51 73 74  BILITOT 0.9 1.4* 1.0  PROT 4.5* 7.0 6.1*  ALBUMIN 2.0* 2.8* 2.7*    No results for input(s): "LIPASE", "AMYLASE" in the last 168 hours. No results for input(s): "AMMONIA" in the last 168 hours. Coagulation Profile: No results for input(s): "INR", "PROTIME" in the last 168 hours. Cardiac Enzymes: No results for input(s): "CKTOTAL", "CKMB", "CKMBINDEX", "TROPONINI" in the last 168 hours. BNP (last 3 results) No results for input(s): "PROBNP" in the last 8760 hours. HbA1C: Recent Labs    08/17/22 0903  HGBA1C 6.0*    CBG: Recent Labs  Lab 08/18/22 1126 08/18/22 1627 08/18/22 1936 08/19/22 0739 08/19/22 1124  GLUCAP 150* 126* 114* 149* 91    Lipid Profile: No results for input(s): "CHOL", "HDL", "LDLCALC", "TRIG", "CHOLHDL", "LDLDIRECT" in the last 72 hours. Thyroid Function Tests: Recent  Labs    08/17/22 0903  TSH 1.974    Anemia Panel: No results for input(s): "VITAMINB12", "FOLATE", "FERRITIN", "TIBC", "IRON", "RETICCTPCT" in the last 72 hours. Sepsis Labs: No results for input(s): "PROCALCITON", "LATICACIDVEN" in the last 168 hours.  No results found for this or any previous visit (from the past 240 hour(s)).   Radiology Studies: VAS [place] LOWER EXTREMITY VENOUS (DVT)  Result Date: 08/18/2022  Lower Venous DVT Study Patient Name:  [name]  Date of Exam:   08/18/2022 Medical Rec #: [id number]       Accession #:    [id number] Date of Birth: December 13, 1965       Patient Gender: M Patient Age:   65 years Exam Location:  [facility] Procedure:      VAS [place] LOWER EXTREMITY VENOUS (DVT) Referring Phys: Ezreal Turay --------------------------------------------------------------------------------  Indications: Edema.  Risk Factors: None identified. Limitations: Poor ultrasound/tissue interface and patient pain tolerance. Comparison Study: No prior studies. Performing Technologist: Oliver Hum RVT  Examination Guidelines: A complete evaluation includes B-mode imaging, spectral Doppler, color Doppler, and power Doppler as needed of all accessible portions of each vessel. Bilateral testing is considered an integral part of a complete examination. Limited examinations for reoccurring indications may be performed as noted. The reflux portion of the exam is performed with the patient in reverse Trendelenburg.  +-----+---------------+---------+-----------+----------+--------------+ RIGHTCompressibilityPhasicitySpontaneityPropertiesThrombus Aging +-----+---------------+---------+-----------+----------+--------------+ CFV  Full           Yes      Yes                                 +-----+---------------+---------+-----------+----------+--------------+   +---------+---------------+---------+-----------+----------+--------------+ LEFT      CompressibilityPhasicitySpontaneityPropertiesThrombus Aging +---------+---------------+---------+-----------+----------+--------------+ CFV      Full           Yes      Yes                                 +---------+---------------+---------+-----------+----------+--------------+  SFJ      Full                                                        +---------+---------------+---------+-----------+----------+--------------+ FV Prox  Full                                                        +---------+---------------+---------+-----------+----------+--------------+ FV Mid   Full                                                        +---------+---------------+---------+-----------+----------+--------------+ FV Distal               Yes      Yes                                 +---------+---------------+---------+-----------+----------+--------------+ PFV      Full                                                        +---------+---------------+---------+-----------+----------+--------------+ POP      Full           Yes      Yes                                 +---------+---------------+---------+-----------+----------+--------------+ PTV      Full                                                        +---------+---------------+---------+-----------+----------+--------------+ PERO     Full                                                        +---------+---------------+---------+-----------+----------+--------------+    Summary: RIGHT: - No evidence of common femoral vein obstruction.  LEFT: - There is no evidence of deep vein thrombosis in the lower extremity. However, portions of this examination were limited- see technologist comments above.  - No cystic structure found in the popliteal fossa.  *See table(s) above for measurements and observations. Electronically signed by Monica Martinez MD on 08/18/2022 at 3:03:53 PM.    Final     ECHOCARDIOGRAM LIMITED  Result Date: 08/18/2022    ECHOCARDIOGRAM REPORT   Patient Name:   [name] Date of Exam: 08/18/2022 Medical Rec #:  [id number]      Height:  69.0 in Accession #:    [id number]     Weight:       190.0 lb Date of Birth:  Oct 03, 1966      BSA:          2.021 m Patient Age:    14 years       BP:           102/61 mmHg Patient Gender: M              HR:           84 bpm. Exam Location:  Inpatient Procedure: Limited Echo, Cardiac Doppler and Color Doppler Indications:    Pulmonary Embolus  History:        Patient has prior history of Echocardiogram examinations, most                 recent 07/14/2022. CHF, Signs/Symptoms:Shortness of Breath and                 Edema; Risk Factors:Dyslipidemia and Hypertension.  Sonographer:    Harvie Junior Referring Phys: [id number] SARA-MAIZ A THOMAS  Sonographer Comments: Image acquisition challenging due to uncooperative patient and agitated. IMPRESSIONS  1. Left ventricular ejection fraction, by estimation, is <20%. The left ventricle has severely decreased function. The left ventricle demonstrates global hypokinesis. Left ventricular diastolic function could not be evaluated.  2. Right ventricular systolic function is moderately reduced. The right ventricular size is mildly enlarged. Tricuspid regurgitation signal is inadequate for assessing PA pressure.  3. Right atrial size was mild to moderately dilated.  4. There is left bowing of the interatrial septum, suggestive of elevated right atrial pressure. No atrial level shunt detected by color flow Doppler.  5. The mitral valve is degenerative. Trivial mitral valve regurgitation. No evidence of mitral stenosis.  6. The aortic valve is tricuspid. Aortic valve regurgitation is not visualized. Aortic valve sclerosis is present, with no evidence of aortic valve stenosis.  7. The inferior vena cava is normal in size with greater than 50% respiratory variability, suggesting right atrial pressure of 3 mmHg.  FINDINGS  Left Ventricle: Left ventricular ejection fraction, by estimation, is <20%. The left ventricle has severely decreased function. The left ventricle demonstrates global hypokinesis. The left ventricular internal cavity size was normal in size. There is no  left ventricular hypertrophy. Left ventricular diastolic function could not be evaluated. Right Ventricle: The right ventricular size is mildly enlarged. No increase in right ventricular wall thickness. Right ventricular systolic function is moderately reduced. Tricuspid regurgitation signal is inadequate for assessing PA pressure. Left Atrium: Left atrial size was normal in size. Right Atrium: Right atrial size was mild to moderately dilated. Pericardium: There is no evidence of pericardial effusion. Mitral Valve: The mitral valve is degenerative in appearance. Trivial mitral valve regurgitation. No evidence of mitral valve stenosis. Tricuspid Valve: The tricuspid valve is normal in structure. Tricuspid valve regurgitation is mild . No evidence of tricuspid stenosis. Aortic Valve: The aortic valve is tricuspid. Aortic valve regurgitation is not visualized. Aortic valve sclerosis is present, with no evidence of aortic valve stenosis. Pulmonic Valve: The pulmonic valve was normal in structure. Pulmonic valve regurgitation is not visualized. No evidence of pulmonic stenosis. Aorta: The aortic root is normal in size and structure. Venous: The inferior vena cava is normal in size with greater than 50% respiratory variability, suggesting right atrial pressure of 3 mmHg. IAS/Shunts: There is left bowing of the interatrial septum, suggestive of elevated right atrial pressure. No  atrial level shunt detected by color flow Doppler.   Diastology LV e' medial:    5.55 cm/s LV E/e' medial:  14.9 LV e' lateral:   10.40 cm/s LV E/e' lateral: 7.9  RIGHT VENTRICLE RV Basal diam:  4.00 cm RV Mid diam:    3.20 cm RV S prime:     8.70 cm/s TAPSE (M-mode): 1.2 cm RIGHT ATRIUM            Index RA Area:     13.20 cm RA Volume:   32.00 ml  15.83 ml/m  MITRAL VALVE MV Area (PHT): 4.80 cm MV Decel Time: 158 msec MV E velocity: 82.50 cm/s MV A velocity: 62.20 cm/s MV E/A ratio:  1.33 Fransico Him MD Electronically signed by Fransico Him MD Signature Date/Time: 08/18/2022/11:38:36 AM    Final    DG Ankle Complete Left  Result Date: 08/17/2022 CLINICAL DATA:  Left ankle pain. EXAM: LEFT ANKLE COMPLETE - 3+ VIEW COMPARISON:  07/12/2022. FINDINGS: No fracture.  No bone lesion. Ankle joint is normally spaced and aligned. Small plantar calcaneal spur. Flattened plantar arch. Previous resection of the great toe. Mild lateral soft tissue swelling. No areas of bone resorption to suggest osteomyelitis. IMPRESSION: 1. No fracture or dislocation.  No evidence of osteomyelitis. Electronically Signed   By: Lajean Manes M.D.   On: 08/17/2022 13:51    Scheduled Meds:  apixaban  10 mg Oral BID   Followed by   Derrill Memo ON 08/24/2022] apixaban  5 mg Oral BID   ARIPiprazole  7.5 mg Oral Daily   carvedilol  3.125 mg Oral BID WC   folic acid  1 mg Oral Daily   furosemide  40 mg Oral BID   gabapentin  400 mg Oral TID   insulin aspart  0-9 Units Subcutaneous TID WC   lisinopril  20 mg Oral Daily   thiamine  100 mg Oral Daily   Continuous Infusions:   LOS: 2 days   Darliss Cheney, MD [facility]  08/19/2022, 1:22 PM   *Please note that this is a verbal dictation therefore any spelling or grammatical errors are due to the "[place]" system interpretation.  Please page via [place] and do not message via secure chat for urgent patient care matters. Secure chat can be used for non urgent patient care matters.  How to contact the [facility] Attending or Consulting provider [place] or covering provider during after hours [place], for this patient?  Check the care team in [facility] and look for a) attending/consulting TRH provider listed and b) the [facility] team listed. Page or secure chat [phone]. Log  into [email or web address] and use [place]'s universal password to access. If you do not have the password, please contact the hospital operator. Locate the [facility] provider you are looking for under [facility] and page to a number that you can be directly reached. If you still have difficulty reaching the provider, please page the [facility] (Director on Call) for the Hospitalists listed on amion for assistance.

## 2022-08-19 NOTE — TOC Progression Note (Addendum)
Transition of Care [facility]) - Progression Note    Patient Details  Name: [name] MRN: [id number] Date of Birth: 1966/10/15  Transition of Care [facility]) CM/SW [place], [name] Phone Number: 08/19/2022, 4:27 PM  Clinical Narrative:    CSW received consult for voluntary inpatient psych placement. CSW faxed referral to [place], [place], and [place]. However, patient's oxygen requirements and physical status may be a barrier. Will continue to refer.   CSW also received consult for men's residential treatment program at [facility] in [place]). CSW spoke with them and receptionist reported they do not have any availability until mid October.    Expected Discharge Plan: Home/Self Care Barriers to Discharge: Continued Medical Work up  Expected Discharge Plan and Services Expected Discharge Plan: Home/Self Care In-house Referral: Clinical Social Work     Living arrangements for the past 2 months: Homeless                                       Social Determinants of Health ([place]) Interventions    Readmission Risk Interventions    08/18/2022    9:00 AM 07/18/2022    4:11 PM 04/08/2022    1:43 PM  Readmission Risk Prevention Plan  Transportation Screening Complete Complete Complete  Medication Review Press photographer) Complete Complete Complete  PCP or Specialist appointment within 3-5 days of discharge Complete Complete Complete  HRI or Home Care Consult Complete Complete Not Complete  HRI or Home Care Consult Pt Refusal Comments   Pt is homeless  SW Recovery Care/Counseling Consult Complete Complete Complete  Palliative Care Screening Not Applicable  Not [place] Not Applicable Complete Not Applicable

## 2022-08-19 NOTE — Plan of Care (Signed)
  Problem: Nutrition: Goal: Adequate nutrition will be maintained Outcome: Progressing   Problem: Coping: Goal: Level of anxiety will decrease Outcome: Progressing   Problem: Pain Managment: Goal: General experience of comfort will improve Outcome: Progressing   

## 2022-08-19 NOTE — Consult Note (Signed)
[facility] Followup Face-to-Face Psychiatric Evaluation   Name: [name] DOB: [birth year] MRN: [id number] Service Date: August 19, 2022 LOS:  LOS: 2 days  Reason for Consult: "Suicidal ideation" Referring Provider: Darliss Cheney, MD   Assessment  [name] is a 56 y.o. male admitted medically for 08/16/2022  3:03 PM for chest pain, dyspnea, and suicidal ideation for the past 2 weeks. He carries the psychiatric diagnoses of schizoaffective disorder bipolar type and has a past medical history of T2DM, DVT, chronic combined diastolic systolic heart failure, right AKA, and normocytic anemia.   His current presentation is most consistent with suicidal ideation in the depressive episode of schizoaffective bipolar type, as a sequelae of current physical symptomology. He continues to meet criteria for inpatient psychiatric admission based on the fact that he is not psychotic and actively endorsing SI with a plan and intent. However, historically, patient has a history of malingering, and currently has a sense of impending doom from his PE that seems to be the primary driver in his desire to end his life, which patient states on assessment today. Current outpatient psychotropic medications include Abilify 5 mg and historically he has had a good response to this medication. He was not compliant with medications prior to admission as evidenced by patient initial reporting. He has been medication compliant thus far this admission. Please see plan below for detailed recommendations.   Diagnoses:  Active Hospital problems: Principal Problem:   Pulmonary emboli (HCC) Active Problems:   Cocaine use disorder, severe, dependence (HCC)   Cocaine abuse with cocaine-induced mood disorder ([facility])   Homeless   Suicidal ideation   Essential hypertension   Bipolar I disorder (HCC)   Chronic combined systolic and diastolic congestive heart failure ([place])     Plan  ## Safety and  Observation Level:  - Based on my clinical evaluation, I estimate the patient to be at moderate risk of self harm in the current setting - At this time, we recommend continuing 1:1 level of observation. This decision is based on my review of the chart including patient's history and current presentation, interview of the patient, mental status examination, and consideration of suicide risk including evaluating suicidal ideation, plan, intent, suicidal or self-harm behaviors, risk factors, and protective factors. This judgment is based on our ability to directly address suicide risk, implement suicide prevention strategies and develop a safety plan while the patient is in the clinical setting. Please contact our team if there is a concern that risk level has changed.   ## Medications:  -- Continue Abilify 7.5 mg daily  ## Medical Decision Making Capacity:  Formal decision making capacity was not assessed for any particular decision as part of routine psychiatric evaluation.   ## Further Work-up:  -- Recommend repeat EKG in 1 to 2 days -Recommend maintaining electrolytes, K+ greater than 4 and Mg greater than 2   -- most recent EKG on 9/16 had QtC of 542, but tachycardic and hypokalemic at the time of EKG; corrected QTc Bazett 474 -- Pertinent labwork reviewed earlier this admission includes:  UDS positive cocaine and opiates Calcium 8.0, but albumin 2.7 Potassium 2.4 on admission, 4.7 today TSH 1.974 A1c 6.0% Magnesium 1.8   ## Disposition:  -- At this time, would recommend inpatient psychiatric admission, as patient is medically stable, per primary team  ## Behavioral / Environmental:  -- 1:1 sitter -Fall precautions, per primary team  ##Legal Status Patient currently voluntary  Thank you for this consult request.  Recommendations have been communicated to the primary team.  We will continue to follow at this time.   Rosezetta Schlatter, MD   Follow-Up history  Relevant Aspects of  Hospital Course:  Admitted on 08/16/2022 for left lower extremity swelling and dyspnea, found to have PE.  NAEON, and no behavioral PRNs required.  Patient Report:  Patient reports not feeling well due to physical symptoms and acknowledges that this is the primary driver of his low mood. He says that he is "tired" and just ready to end it all. He is "tired of being homeless, tired of medical problems, and tired of using drugs." When advised that I have concerns about those reports and that my job is to prolong life, he voices understanding but asks, "What's the point? No one is going to miss me anyway." He continues to endorse suicidal ideation with a plan to walk into traffic; as well, command hallucinations have continued to tell him to rollover in traffic.  He denies adverse effects of medications. He reports chest pain, and difficulty breathing 2/2 his PE as well as constipation, unsure as to when his last BM was. He adamantly declines laxatives. He then gets upset, accusing this Probation officer of not introducing the medical student in the room and discontinues the assessment.    Collateral information:  No collateral obtained today.   Psychiatric History:  Information collected from patient, chart  Per Patient and old records-Depression, Schizoaffective disorder,hx of bipolar, schizophrenia and cocaine abuse. Pt reports several hospitalizations , more than he is able to count and has had several suicide attempts .   Family psych history: reported mom committed suicide and died but did not say what she did.   Social History:  Homeless, ambulates with walker and wheelchair  Tobacco use: Denies intermittent use, but would not detail amount Alcohol use: Denies current use Drug use: Cocaine use, with last use the night before ED presentation  Family History:   The patient's family history includes Drug abuse in his mother; Suicidality in his mother.  Medical History: Past Medical History:   Diagnosis Date   Asthma    Bipolar 1 disorder ([place])    CHF (congestive heart failure) (HCC)    COPD (chronic obstructive pulmonary disease) ([place])    Depression    Diabetes mellitus without complication (HCC)    GERD (gastroesophageal reflux disease)    Homelessness    Hypertension    QT prolongation    Schizoaffective disorder [facility])     Surgical History: Past Surgical History:  Procedure Laterality Date   AMPUTATION TOE Bilateral 01/02/2021   Procedure: AMPUTATION TOE;  Surgeon: Evelina Bucy, DPM;  Location: [facility];  Service: Podiatry;  Laterality: Bilateral;  Ok to leave patient on hospital bed   IR ANGIO INTRA EXTRACRAN SEL COM CAROTID INNOMINATE BILAT MOD SED  12/17/2020   IR ANGIO VERTEBRAL SEL VERTEBRAL BILAT MOD SED  12/17/2020   RADIOLOGY WITH ANESTHESIA N/A 12/17/2020   Procedure: IR WITH ANESTHESIA;  Surgeon: Radiologist, Medication, MD;  Location: [facility];  Service: Radiology;  Laterality: N/A;    Medications:   Current Facility-Administered Medications:    apixaban (ELIQUIS) tablet 10 mg, 10 mg, Oral, BID, 10 mg at 08/19/22 0836 **FOLLOWED BY** [START ON 08/24/2022] apixaban (ELIQUIS) tablet 5 mg, 5 mg, Oral, BID, Bell, Lorin C, RPH   ARIPiprazole (ABILIFY) tablet 7.5 mg, 7.5 mg, Oral, Daily, Mazie Fencl, MD, 7.5 mg at 08/19/22 0829   carvedilol (COREG) tablet 3.125 mg, 3.125 mg, Oral,  BID WC, Myles Rosenthal A, MD, 3.125 mg at 78/46/96 2952   folic acid (FOLVITE) tablet 1 mg, 1 mg, Oral, Daily, Myles Rosenthal A, MD, 1 mg at 08/19/22 0836   furosemide (LASIX) tablet 40 mg, 40 mg, Oral, BID, Myles Rosenthal A, MD, 40 mg at 08/19/22 1615   gabapentin (NEURONTIN) capsule 400 mg, 400 mg, Oral, TID, Myles Rosenthal A, MD, 400 mg at 08/19/22 1616   hydrALAZINE (APRESOLINE) tablet 25 mg, 25 mg, Oral, Q6H PRN, Pahwani, Ravi, MD   insulin aspart (novoLOG) injection 0-9 Units, 0-9 Units, Subcutaneous, TID WC, Clance Boll, MD, 1 Units at 08/19/22 0835    lisinopril (ZESTRIL) tablet 20 mg, 20 mg, Oral, Daily, Pahwani, Ravi, MD, 20 mg at 08/19/22 0835   thiamine (VITAMIN B1) tablet 100 mg, 100 mg, Oral, Daily, Myles Rosenthal A, MD, 100 mg at 08/19/22 0835  Allergies: Allergies  Allergen Reactions   Motrin [Ibuprofen] Anaphylaxis   Tylenol [Acetaminophen] Hives, Nausea And Vomiting and Rash   Lyrica Cr [Pregabalin Er] Other (See Comments)    Dystonia Dysarthria    Peanut-Containing Drug Products Other (See Comments)    Pt denies allergy to peanut butter 03/02/2021       Objective  Vital signs:  Temp:  [98.2 F (36.8 C)-100.1 F (37.8 C)] 98.2 F (36.8 C) (09/19 1600) Pulse Rate:  [77-84] 77 (09/19 1600) Resp:  [16-23] 20 (09/19 1600) BP: (100-132)/(68-92) 132/72 (09/19 1600) SpO2:  [95 %-100 %] 95 % (09/19 1202)  Psychiatric Specialty Exam:  Presentation  General Appearance: Appropriate for Environment (In hospital gown)  Eye Contact:Good  Speech:Clear and Coherent; Normal Rate  Speech Volume:Normal  Handedness:Right   Mood and Affect  Mood:Depressed; Irritable  Affect:Congruent; Depressed   Thought Process  Thought Processes:Coherent; Linear  Descriptions of Associations:Intact  Orientation:Full (Time, Place and Person)  Thought Content:Logical; WDL  History of Schizophrenia/Schizoaffective disorder:Yes  Duration of Psychotic Symptoms:Greater than six months  Hallucinations:Hallucinations: Command; Auditory Description of Command Hallucinations: "Rollover in traffic" Description of Auditory Hallucinations: "Nobody will miss you"  Ideas of Reference:None  Suicidal Thoughts:Suicidal Thoughts: Yes, Active SI Active Intent and/or Plan: With Intent; With Plan; With Means to [place]; Recent Fair  Judgment:Poor  Insight:Fair   Executive Functions  Concentration:Fair  Attention Span:Fair  Erin   Psychomotor Activity  Psychomotor Activity:Psychomotor Activity: Normal  Assets  Assets:Communication Skills; Desire for Improvement; Financial Resources/Insurance   Sleep  Sleep:Sleep: Fair   Physical Exam: Physical Exam Vitals reviewed.  Constitutional:      General: He is in acute distress.     Appearance: He is ill-appearing and diaphoretic.  Pulmonary:     Effort: Respiratory distress present.     Comments: Nasal cannula in place Musculoskeletal:     Comments: R AKA  Neurological:     Mental Status: He is alert.     Blood pressure 132/72, pulse 77, temperature 98.2 F (36.8 C), temperature source Oral, resp. rate 20, height 5\' 9"  (1.753 m), weight 86.2 kg, SpO2 95 %. Body mass index is 28.06 kg/m.

## 2022-08-20 ENCOUNTER — Inpatient Hospital Stay (HOSPITAL_COMMUNITY): Payer: Medicaid Other

## 2022-08-20 DIAGNOSIS — I2699 Other pulmonary embolism without acute cor pulmonale: Secondary | ICD-10-CM | POA: Diagnosis not present

## 2022-08-20 LAB — GLUCOSE, CAPILLARY
Glucose-Capillary: 158 mg/dL — ABNORMAL HIGH (ref 70–99)
Glucose-Capillary: 76 mg/dL (ref 70–99)
Glucose-Capillary: 129 mg/dL — ABNORMAL HIGH (ref 70–99)
Glucose-Capillary: 128 mg/dL — ABNORMAL HIGH (ref 70–99)

## 2022-08-20 LAB — PROCALCITONIN: Procalcitonin: <0.1 ng/mL

## 2022-08-20 MED ORDER — LISINOPRIL 10 MG PO TABS
10.0000 mg | ORAL_TABLET | Freq: Every day | ORAL | Status: DC
  Administered 2022-08-21 – 2022-10-09 (×50): 10 mg via ORAL
  Filled 2022-08-20 (×50): qty 1

## 2022-08-20 MED ORDER — OXYCODONE HCL 5 MG PO TABS
5.0000 mg | ORAL_TABLET | Freq: Four times a day (QID) | ORAL | Status: AC | PRN
  Administered 2022-08-20 – 2022-08-21 (×3): 5 mg via ORAL
  Filled 2022-08-20 (×3): qty 1

## 2022-08-20 MED ORDER — AMOXICILLIN-POT CLAVULANATE 875-125 MG PO TABS
1.0000 | ORAL_TABLET | Freq: Two times a day (BID) | ORAL | Status: DC
  Administered 2022-08-20 (×2): 1 via ORAL
  Filled 2022-08-20 (×2): qty 1

## 2022-08-20 NOTE — TOC Progression Note (Addendum)
Transition of Care [facility]) - Progression Note    Patient Details  Name: [name] MRN: [id number] Date of Birth: 28-Sep-1966  Transition [facility] CM/SW [place], [name] Phone Number: 08/20/2022, 9:56 AM  Clinical Narrative:    9:56am-[facility] unable to accept patient today. [facility] reviewing for bed availability.   10:10 AM-[facility] Disposition may have a bed later tonight and will let CSW know.   10:18 AM-[facility] RN reported they cannot take patients on oxygen. Per RN, patient required 4L last night. CSW checking with today's RN for O2 requirements.   1:30pm-Per RN patient has weaned to room air. CSW requested [place] review patient since he is off of the oxygen. CSW met with patient and answered his questions regarding his plan; he reported appreciation for CSW speaking with him. CSW also provided community resources for substance use and homelessness. Patient expressed concern about never receiving an electric wheelchair as he said it would make it easier on him to travel long distances between shelters. CSW contacted [employer] and [facility] stated that they had received the order in August but patient requires an evaluation outpatient; likely they are not able to get in touch with the patient to conduct the assessment.   4:30pm-Per [facility] MD, they do not feel that they can handle patient's mobility needs at wheelchair level.    Referrals faxed to:  [facility]   [facility]   [facility]   [facility]     Expected Discharge Plan: Home/Self Care Barriers to Discharge: Continued Medical Work up  Expected Discharge Plan and Services Expected Discharge Plan:  Home/Self Care In-house Referral: Clinical Social Work     Living arrangements for the past 2 months: Homeless                                       Social Determinants of Health (SDOH) Interventions    Readmission Risk Interventions    08/18/2022    9:00 AM 07/18/2022    4:11 PM 04/08/2022    1:43 PM  Readmission Risk Prevention Plan  Transportation Screening Complete Complete Complete  Medication Review (RN Care Manager) Complete Complete Complete  PCP or Specialist appointment within 3-5 days of discharge Complete Complete Complete  HRI or Home Care Consult Complete Complete Not Complete  HRI or Home Care Consult Pt Refusal Comments   Pt is homeless  SW Recovery Care/Counseling Consult Complete Complete Complete  Palliative Care Screening Not Applicable  Not [place] Not Applicable Complete Not Applicable

## 2022-08-20 NOTE — Progress Notes (Signed)
PROGRESS NOTE    [name]  [id number] DOB: 06-Feb-1966 DOA: 08/16/2022 PCP: Marliss Coots, NP   Brief Narrative:  [name] is a 56 y.o. male with medical history significant of  asthma, bipolar 1 disorder, depression, schizoaffective disorder, chronic congestive heart failure, homelessness, GERD, hypertension, QT prolongation, type 2 diabetes, alcohol abuse, cocaine and cannabis abuse, tobacco use, chronic combined systolic and diastolic heart failure, as well as right AKA. Patient also has interim history of admission 8/12-8/25 with discharged diagnosis of Acute on chronic systolic heart failure. Patient now presents to ED with complaint of sob/chest pain/ leg swelling  x 2 weeks as well as SI. Per patient states she has not taken his medications in two weeks including eliquis for tx of his DVT. Patient during interview notes no n/v/d and s/p this ,states he is his fine and refused further interview or exam.  So due to this primary history is taken solely from chart.   Assessment & Plan:   Principal Problem:   Pulmonary emboli (HCC) Active Problems:   Cocaine use disorder, severe, dependence (HCC)   Cocaine abuse with cocaine-induced mood disorder ([name])   Homeless   Suicidal ideation   Essential hypertension   Bipolar I disorder (HCC)   Chronic combined systolic and diastolic congestive heart failure (HCC)  Bilateral PE: Patient was started on heparin drip.  However IV infiltrated and patient was not allowing anyone she put another IV line so he was transitioned to Eliquis.  Patient is comfortable. DVT ruled out.  He is slightly hypoxic.  Chest repeated which shows slightly worsening of peripheral pulmonary density in the right mid and lower lung which could be infarction or pneumonia.  Procalcitonin unremarkable, but I wonder if he is having recurrent aspiration causing aspiration pneumonia since he already has a stroke and left facial droop.  We will consult SLP.   Patient has no respiratory symptoms.  He is now weaned to room air.  I will empirically start him on Augmentin for now.  I have ordered ambulatory oximetry.  Continue Eliquis.   Hx of combined diastolic/systolic heart failure: Last echo done on 07/14/2022 shows less than 20% ejection fraction and grade 2 diastolic dysfunction.  Repeat echo during this admission does not show any change. Patient received Lasix in the ED.  His BNP appears to be slightly elevated than his baseline.  Chest x-ray did not show any pulmonary edema.  He is not hypoxic.  Continue home dose of Lasix 40 mg p.o. daily.  Left ankle/lower extremity pain: Patient complained of left ankle pain.  He has old and fully healed wound.  He denies or does not recall any trauma. CT left lower ext with runoffs  no obstruction noted, Noted soft tissue swelling.  X-ray negative.  Doppler neg for DVT as well.  He is requesting some pain medications.  We will give him few doses of oxycodone 5 mg.  Bipolar d/o/depression/schizoaffective disorder/suicidal ideation: Patient does endorse having suicidal ideation, when asked about any plans, he said he had plans to come in front of the cars on the road.  Currently has one-on-one sitter which we will continue.  Patient is interested in psychiatric evaluation.  Psychiatry has seen the patient as well.  They are now recommending inpatient psychiatry admission.  TOC is working on finding a placement.  We will continue one-on-one sitter.  Patient is moderate to high risk for harm himself.   Asthma, mild intermittent: Not in acute exacerbation.  Continue bronchodilators.  GERD:    Hypertension, essential: Blood pressure very labile overall.  This is likely secondary to noncompliance.  Patient has not been taking any of his medications for 2 weeks.  We have resumed his PTA medications which include Coreg.  I have added lisinopril 20 mg as well as as needed hydralazine.  Blood pressures low normal now.  Will  reduce lisinopril to 10 mg.   Type 2 diabetes mellitus: Hemoglobin A1c 6.0.  Continue SSI.  Blood sugar controlled.   Hx-Prolonged QT: QTc 542.  Keep electrolytes within normal range.   Polysubstance abuse/alcohol abuse: UDS positive for cocaine and opioids.  No signs of withdrawal.  Monitor.   Tobacco use: Counseling provided to quit smoking.  Intermittent chest pain: Musculoskeletal as it is tender to palpation.  Troponins negative.  EKG unremarkable. - DVT prophylaxis: ELIQUIS   Code Status: Full Code  Family Communication:  None present at bedside.  Plan of care discussed with patient in length and he/she verbalized understanding and agreed with it.  Status is: Inpatient Remains inpatient appropriate because: Patient medically stable for discharge.  I have relayed this information to psychiatry.  They are recommending inpatient psychiatry admission.  TOC working on that.   Estimated body mass index is 28.06 kg/m as calculated from the following:   Height as of this encounter: 5\' 9"  (1.753 m).   Weight as of this encounter: 86.2 kg.    Nutritional Assessment: Body mass index is 28.06 kg/m.Marland Kitchen Seen by dietician.  I agree with the assessment and plan as outlined below: Nutrition Status:        . Skin Assessment: I have examined the patient's skin and I agree with the wound assessment as performed by the wound care RN as outlined below:    Consultants:  Psychiatry  Procedures:  None  Antimicrobials:  Anti-infectives (From admission, onward)    None         Subjective:  Patient seen and examined.  Complains of chest pain intermittently.  Also complains of left leg pain.  No other complaint.  Objective: Vitals:   08/20/22 0400 08/20/22 0800 08/20/22 1100 08/20/22 1216  BP: 106/75 116/72  115/81  Pulse: 73 78  81  Resp: 20 17  20   Temp: 98.4 F (36.9 C) 98.2 F (36.8 C)  98.2 F (36.8 C)  TempSrc: Oral Oral  Oral  SpO2:  97% 96% 97%  Weight:       Height:        Intake/Output Summary (Last 24 hours) at 08/20/2022 1244 Last data filed at 08/20/2022 1150 Gross per 24 hour  Intake 1440 ml  Output 3850 ml  Net -2410 ml    Filed Weights   08/16/22 1543  Weight: 86.2 kg    Examination:  General exam: Appears calm and comfortable  Respiratory system: Clear to auscultation. Respiratory effort normal. Cardiovascular system: S1 & S2 heard, RRR. No JVD, murmurs, rubs, gallops or clicks. No pedal edema. Gastrointestinal system: Abdomen is nondistended, soft and nontender. No organomegaly or masses felt. Normal bowel sounds heard. Central nervous system: Alert and oriented.  Left facial droop.  Some dysarthria which is at baseline.  Right-sided weakness due to prior stroke. Extremities: Right BKA Psychiatry: Judgement and insight appear normal. Mood & affect appropriate.   Data Reviewed: I have personally reviewed following labs and imaging studies  CBC: Recent Labs  Lab 08/16/22 1542 08/16/22 1717 08/16/22 1939 08/18/22 1449  WBC 5.6  --   --  9.2  NEUTROABS 3.0  --   --  6.2  HGB 10.7* 8.5* 12.2* 12.0*  HCT 34.9* 25.0* 36.0* 36.6*  MCV 94.1  --   --  89.7  PLT 250  --   --  123456    Basic Metabolic Panel: Recent Labs  Lab 08/16/22 1542 08/16/22 1717 08/16/22 1912 08/16/22 1939 08/17/22 0903 08/18/22 0741  NA 141 143 136 138 138 135  K 2.4* 2.4* 3.7 3.7 3.8 4.7  CL 117*  --  105  --  104 103  CO2 14*  --  21*  --  24 17*  GLUCOSE 63*  --  116*  --  102* 89  BUN 9  --  11  --  9 11  CREATININE 0.53*  --  0.92  --  1.02 0.86  CALCIUM 5.7*  --  8.5*  --  8.3* 8.0*  MG  --   --  1.8  --  1.8  --     GFR: Estimated Creatinine Clearance: 106.8 mL/min (by C-G formula based on SCr of 0.86 mg/dL). Liver Function Tests: Recent Labs  Lab 08/16/22 1542 08/16/22 1912 08/17/22 0903  AST 10* 15 17  ALT 7 9 7   ALKPHOS 51 73 74  BILITOT 0.9 1.4* 1.0  PROT 4.5* 7.0 6.1*  ALBUMIN 2.0* 2.8* 2.7*    No results for  input(s): "LIPASE", "AMYLASE" in the last 168 hours. No results for input(s): "AMMONIA" in the last 168 hours. Coagulation Profile: No results for input(s): "INR", "PROTIME" in the last 168 hours. Cardiac Enzymes: No results for input(s): "CKTOTAL", "CKMB", "CKMBINDEX", "TROPONINI" in the last 168 hours. BNP (last 3 results) No results for input(s): "PROBNP" in the last 8760 hours. HbA1C: No results for input(s): "HGBA1C" in the last 72 hours.  CBG: Recent Labs  Lab 08/19/22 1124 08/19/22 1609 08/19/22 2125 08/20/22 0803 08/20/22 1140  GLUCAP 91 113* 124* 158* 76    Lipid Profile: No results for input(s): "CHOL", "HDL", "LDLCALC", "TRIG", "CHOLHDL", "LDLDIRECT" in the last 72 hours. Thyroid Function Tests: No results for input(s): "TSH", "T4TOTAL", "FREET4", "T3FREE", "THYROIDAB" in the last 72 hours.  Anemia Panel: No results for input(s): "VITAMINB12", "FOLATE", "FERRITIN", "TIBC", "IRON", "RETICCTPCT" in the last 72 hours. Sepsis Labs: Recent Labs  Lab 08/20/22 1032  PROCALCITON <0.10    No results found for this or any previous visit (from the past 240 hour(s)).   Radiology Studies: DG CHEST PORT 1 VIEW  Result Date: 08/20/2022 CLINICAL DATA:  Asthma.  Hypoxia. EXAM: PORTABLE CHEST 1 VIEW COMPARISON:  08/16/2022 FINDINGS: Mild cardiomegaly. Emphysema affecting both upper lungs. The left lung is otherwise clear. Pulmonary opacity in the right lateral mid and lower lung is more pronounced and consistent with infarction or pneumonia based on the prior CT. This appears slightly more pronounced by radiography. Small amount of pleural fluid accumulating on the right. IMPRESSION: Slight worsening of peripheral pulmonary density in the right mid and lower lung which could be due to infarction or pneumonia on the basis of the prior CT study. Small amount of pleural fluid on the right. Background emphysema. Electronically Signed   By: Nelson Chimes M.D.   On: 08/20/2022 08:33    VAS [place] LOWER EXTREMITY VENOUS (DVT)  Result Date: 08/18/2022  Lower Venous DVT Study Patient Name:  TACOMA DRUMM  Date of Exam:   08/18/2022 Medical Rec #: [id number]       Accession #:    [id number] Date of Birth: 06-29-1966  Patient Gender: M Patient Age:   69 years Exam Location:  [facility] Procedure:      VAS [place] LOWER EXTREMITY VENOUS (DVT) Referring Phys: Iana Buzan --------------------------------------------------------------------------------  Indications: Edema.  Risk Factors: None identified. Limitations: Poor ultrasound/tissue interface and patient pain tolerance. Comparison Study: No prior studies. Performing Technologist: Oliver Hum RVT  Examination Guidelines: A complete evaluation includes B-mode imaging, spectral Doppler, color Doppler, and power Doppler as needed of all accessible portions of each vessel. Bilateral testing is considered an integral part of a complete examination. Limited examinations for reoccurring indications may be performed as noted. The reflux portion of the exam is performed with the patient in reverse Trendelenburg.  +-----+---------------+---------+-----------+----------+--------------+ RIGHTCompressibilityPhasicitySpontaneityPropertiesThrombus Aging +-----+---------------+---------+-----------+----------+--------------+ CFV  Full           Yes      Yes                                 +-----+---------------+---------+-----------+----------+--------------+   +---------+---------------+---------+-----------+----------+--------------+ LEFT     CompressibilityPhasicitySpontaneityPropertiesThrombus Aging +---------+---------------+---------+-----------+----------+--------------+ CFV      Full           Yes      Yes                                 +---------+---------------+---------+-----------+----------+--------------+ SFJ      Full                                                         +---------+---------------+---------+-----------+----------+--------------+ FV Prox  Full                                                        +---------+---------------+---------+-----------+----------+--------------+ FV Mid   Full                                                        +---------+---------------+---------+-----------+----------+--------------+ FV Distal               Yes      Yes                                 +---------+---------------+---------+-----------+----------+--------------+ PFV      Full                                                        +---------+---------------+---------+-----------+----------+--------------+ POP      Full           Yes      Yes                                 +---------+---------------+---------+-----------+----------+--------------+  PTV      Full                                                        +---------+---------------+---------+-----------+----------+--------------+ PERO     Full                                                        +---------+---------------+---------+-----------+----------+--------------+    Summary: RIGHT: - No evidence of common femoral vein obstruction.  LEFT: - There is no evidence of deep vein thrombosis in the lower extremity. However, portions of this examination were limited- see technologist comments above.  - No cystic structure found in the popliteal fossa.  *See table(s) above for measurements and observations. Electronically signed by Monica Martinez MD on 08/18/2022 at 3:03:53 PM.    Final     Scheduled Meds:  apixaban  10 mg Oral BID   Followed by   Derrill Memo ON 08/24/2022] apixaban  5 mg Oral BID   ARIPiprazole  7.5 mg Oral Daily   carvedilol  3.125 mg Oral BID WC   folic acid  1 mg Oral Daily   furosemide  40 mg Oral BID   gabapentin  400 mg Oral TID   insulin aspart  0-9 Units Subcutaneous TID WC   lisinopril  20 mg Oral Daily   thiamine  100 mg Oral  Daily   Continuous Infusions:   LOS: 3 days   Darliss Cheney, MD [facility]  08/20/2022, 12:44 PM   *Please note that this is a verbal dictation therefore any spelling or grammatical errors are due to the "[place] [employer]" system interpretation.  Please page via [place] and do not message via secure chat for urgent patient care matters. Secure chat can be used for non urgent patient care matters.  How to contact the [facility] Attending or Consulting provider [place] or covering provider during after hours [place], for this patient?  Check the care team in [facility] and look for a) attending/consulting TR[facility] provider listed and b) the [name] team listed. Page or secure chat [phone]. Log into [email or web address] and use [place]'s universal password to access. If you do not have the password, please contact the hospital operator. Locate the [facility] provider you are looking for under [facility] and page to a number that you can be directly reached. If you still have difficulty reaching the provider, please page the [facility] (Director on Call) for the Hospitalists listed on amion for assistance.

## 2022-08-21 DIAGNOSIS — I5042 Chronic combined systolic (congestive) and diastolic (congestive) heart failure: Secondary | ICD-10-CM

## 2022-08-21 DIAGNOSIS — F319 Bipolar disorder, unspecified: Secondary | ICD-10-CM

## 2022-08-21 DIAGNOSIS — F1414 Cocaine abuse with cocaine-induced mood disorder: Secondary | ICD-10-CM | POA: Diagnosis not present

## 2022-08-21 DIAGNOSIS — I2699 Other pulmonary embolism without acute cor pulmonale: Secondary | ICD-10-CM | POA: Diagnosis not present

## 2022-08-21 LAB — GLUCOSE, CAPILLARY
Glucose-Capillary: 110 mg/dL — ABNORMAL HIGH (ref 70–99)
Glucose-Capillary: 115 mg/dL — ABNORMAL HIGH (ref 70–99)
Glucose-Capillary: 119 mg/dL — ABNORMAL HIGH (ref 70–99)
Glucose-Capillary: 161 mg/dL — ABNORMAL HIGH (ref 70–99)
Glucose-Capillary: 188 mg/dL — ABNORMAL HIGH (ref 70–99)
Glucose-Capillary: 91 mg/dL (ref 70–99)

## 2022-08-21 MED ORDER — ARIPIPRAZOLE 5 MG PO TABS
2.5000 mg | ORAL_TABLET | Freq: Once | ORAL | Status: AC
  Administered 2022-08-21: 2.5 mg via ORAL
  Filled 2022-08-21: qty 1

## 2022-08-21 MED ORDER — REVEFENACIN 175 MCG/3ML IN SOLN
175.0000 ug | Freq: Every day | RESPIRATORY_TRACT | Status: DC
  Administered 2022-08-22 – 2022-10-05 (×37): 175 ug via RESPIRATORY_TRACT
  Filled 2022-08-21 (×42): qty 3

## 2022-08-21 MED ORDER — ARIPIPRAZOLE 5 MG PO TABS
10.0000 mg | ORAL_TABLET | Freq: Every day | ORAL | Status: DC
  Administered 2022-08-22 – 2022-10-09 (×48): 10 mg via ORAL
  Filled 2022-08-21 (×2): qty 2
  Filled 2022-08-21 (×3): qty 1
  Filled 2022-08-21: qty 2
  Filled 2022-08-21 (×2): qty 1
  Filled 2022-08-21: qty 2
  Filled 2022-08-21 (×3): qty 1
  Filled 2022-08-21 (×2): qty 2
  Filled 2022-08-21: qty 1
  Filled 2022-08-21 (×3): qty 2
  Filled 2022-08-21: qty 1
  Filled 2022-08-21 (×2): qty 2
  Filled 2022-08-21: qty 1
  Filled 2022-08-21 (×3): qty 2
  Filled 2022-08-21 (×2): qty 1
  Filled 2022-08-21 (×2): qty 2
  Filled 2022-08-21: qty 1
  Filled 2022-08-21 (×3): qty 2
  Filled 2022-08-21: qty 1
  Filled 2022-08-21 (×8): qty 2
  Filled 2022-08-21 (×2): qty 1
  Filled 2022-08-21 (×2): qty 2
  Filled 2022-08-21: qty 1
  Filled 2022-08-21 (×3): qty 2

## 2022-08-21 MED ORDER — ARFORMOTEROL TARTRATE 15 MCG/2ML IN NEBU
15.0000 ug | INHALATION_SOLUTION | Freq: Two times a day (BID) | RESPIRATORY_TRACT | Status: DC
  Administered 2022-08-21 – 2022-10-05 (×68): 15 ug via RESPIRATORY_TRACT
  Filled 2022-08-21 (×86): qty 2

## 2022-08-21 MED ORDER — OXYCODONE HCL 5 MG PO TABS
5.0000 mg | ORAL_TABLET | Freq: Four times a day (QID) | ORAL | Status: DC | PRN
  Administered 2022-08-21 – 2022-10-08 (×96): 5 mg via ORAL
  Filled 2022-08-21 (×102): qty 1

## 2022-08-21 MED ORDER — BUDESONIDE 0.25 MG/2ML IN SUSP
0.2500 mg | Freq: Two times a day (BID) | RESPIRATORY_TRACT | Status: DC
  Administered 2022-08-21 – 2022-10-05 (×68): 0.25 mg via RESPIRATORY_TRACT
  Filled 2022-08-21 (×86): qty 2

## 2022-08-21 NOTE — Progress Notes (Signed)
PROGRESS NOTE   Patient suffers from R sided incoordination/weakness from stroke and R AKA which impairs their ability to perform daily activities like mobility  in the community, as pt is also homeless.  A walker will not resolve the issues with performing activities of daily living. A manual wheelchair does not allow this patient to self propel adequately for the community and a power chair will be very beneficial to the patient to help him negotiate through the community and will help allow patient to safely perform daily activities.  The patient can self propel iwith difficulty in a home-like environment.  He does not have a caregiver who can provide assistance.      08/21/2022  Ginger Carne., PT Acute Rehabilitation Services [phone]  (office)

## 2022-08-21 NOTE — Evaluation (Signed)
Physical Therapy Evaluation Patient Details Name: [name] MRN: [id number] DOB: 04-Feb-1966 Today's Date: 08/21/2022  History of Present Illness  Pt is a 56yo M who presented 9/16 with c/o chest pain, SOB, and leg swelling. Found to have B PE. PMH includes asthma, bioplar 1, depression, schizoaffective disorder, CVA, chronic CHF, homelessness, HTN, DM2, cocaine and cannabis use and R AKA.  Clinical Impression  Pt admitted with/for SOB from bil PE.  Pt presently not at baseline function due to weakness and compromise respiratory function.  Pt was tentative with limited mobility to the chair at a min guard level.  Pt would really love to be assessed for a power w/c as he has significant limitations for propelling a manual chair..  Pt currently limited functionally due to the problems listed below.  (see problems list.)  Pt will benefit from PT to maximize function and safety and prepare to work well in the next venue.         Recommendations for follow up therapy are one component of a multi-disciplinary discharge planning process, led by the attending physician.  Recommendations may be updated based on patient status, additional functional criteria and insurance authorization.  Follow Up Recommendations Skilled nursing-short term rehab (<3 hours/day) Can patient physically be transported by private vehicle: No    Assistance Recommended at Discharge Intermittent Supervision/Assistance  Patient can return home with the following  A lot of help with bathing/dressing/bathroom;Assist for transportation;Help with stairs or ramp for entrance    Equipment Recommendations Wheelchair (measurements PT);Wheelchair cushion (measurements PT) (power chair evaluation with cushion)  Recommendations for Other Services       Functional Status Assessment Patient has had a recent decline in their functional status and demonstrates the ability to make significant improvements in function in a reasonable and  predictable amount of time.     Precautions / Restrictions Precautions Precautions: Fall;Other (comment) (R AKA, L great toe amputation) Restrictions Weight Bearing Restrictions: No      Mobility  Bed Mobility Overal bed mobility: Needs Assistance Bed Mobility: Supine to Sit, Sit to Supine     Supine to sit: Supervision Sit to supine: Supervision        Transfers Overall transfer level: Needs assistance Equipment used: Rolling walker (2 wheels) Transfers: Sit to/from Stand, Bed to chair/wheelchair/BSC   Stand pivot transfers: Min guard         General transfer comment: with chair set up to closely simulate position of his w/c, pt tentatively transfer bed to chair without assist.    Ambulation/Gait                  Stairs            Wheelchair Mobility    Modified Rankin (Stroke Patients Only)       Balance Overall balance assessment: Needs assistance Sitting-balance support: No upper extremity supported Sitting balance-Leahy Scale: Good     Standing balance support: During functional activity, Bilateral upper extremity supported Standing balance-Leahy Scale: Poor Standing balance comment: UE assist would be crucial                             Pertinent Vitals/Pain Pain Assessment Pain Assessment: 0-10 Pain Score: 6  Pain Location: L foot/ankle Pain Descriptors / Indicators: Aching, Constant Pain Intervention(s): Monitored during session, Limited activity within patient's tolerance    Home Living Family/patient expects to be discharged to:: Shelter/Homeless  Prior Function Prior Level of Function : Patient poor historian/Family not available;Independent/Modified Independent               ADLs Comments: Also with PT, this pt was fairly vague in his description of PLOF. He was homeless and living on the streets in his w/c. He is unable to state how he completed ADLs or how he got food.      Hand Dominance   Dominant Hand: Right    Extremity/Trunk Assessment   Upper Extremity Assessment RUE Deficits / Details: Ataxic RUE from prior CVA. Ataxic during all intentional reaching and AROM at the shoulder limited to 90 degrees. MMT 3+/5 overall distally. RUE Sensation: decreased light touch RUE Coordination: decreased gross motor;decreased fine motor         Cervical / Trunk Assessment Cervical / Trunk Assessment: Normal  Communication   Communication: Expressive difficulties  Cognition Arousal/Alertness: Awake/alert Behavior During Therapy: WFL for tasks assessed/performed Overall Cognitive Status: No family/caregiver present to determine baseline cognitive functioning                                 General Comments: Pt oriented to self and place only. He was able to follow all directions and was very pleasant during session. Just not a good historian with what hinders him in the community.  He is very concerned with getting a power chair "Hoverround"-like something  "I go around in circles with a (manual) chair, because my R arm doesn't work so well after my stroke, and there is no one to help me.        General Comments General comments (skin integrity, edema, etc.): VSS on 4L with activity, notable dyspnea with watery breath sounds    Exercises     Assessment/Plan    PT Assessment Patient needs continued PT services  PT Problem List Decreased strength;Decreased activity tolerance;Decreased mobility;Cardiopulmonary status limiting activity       PT Treatment Interventions Functional mobility training;Therapeutic activities;Therapeutic exercise;Patient/family education    PT Goals (Current goals can be found in the Care Plan section)  Acute Rehab PT Goals Patient Stated Goal: get a power chair PT Goal Formulation: With patient Time For Goal Achievement: 09/04/22 Potential to Achieve Goals: Good    Frequency Min 3X/week      Co-evaluation               AM-PAC PT "6 Clicks" Mobility  Outcome Measure Help needed turning from your back to your side while in a flat bed without using bedrails?: None Help needed moving from lying on your back to sitting on the side of a flat bed without using bedrails?: None Help needed moving to and from a bed to a chair (including a wheelchair)?: A Little Help needed standing up from a chair using your arms (e.g., wheelchair or bedside chair)?: A Little Help needed to walk in hospital room?: Total Help needed climbing 3-5 steps with a railing? : Total 6 Click Score: 16    End of Session Equipment Utilized During Treatment: Oxygen Activity Tolerance: Patient tolerated treatment well Patient left: in chair;with call bell/phone within reach;with chair alarm set   PT Visit Diagnosis: Other abnormalities of gait and mobility (R26.89)    Time: 8527-7824 PT Time Calculation (min) (ACUTE ONLY): 23 min   Charges:   PT Evaluation $PT Eval Moderate Complexity: 1 Mod PT Treatments $Therapeutic Activity: 8-22 mins  08/21/2022  Ginger Carne., PT Acute Rehabilitation Services [phone]  (office)  Tessie Fass Ciani [place] 08/21/2022, 1:44 PM

## 2022-08-21 NOTE — TOC Progression Note (Signed)
Transition of Care [facility]) - Progression Note    Patient Details  Name: [name] MRN: [id number] Date of Birth: 1966-11-30  Transition of Care [facility]) CM/SW [place], [name] Phone Number: 08/21/2022, 6:15 PM  Clinical Narrative:    [facility] unable to accept patient. No other facility offers at this time.    Expected Discharge Plan: Home/Self Care Barriers to Discharge: Continued Medical Work up  Expected Discharge Plan and Services Expected Discharge Plan: Home/Self Care In-house Referral: Clinical Social Work     Living arrangements for the past 2 months: Homeless                                       Social Determinants of Health ([place]) Interventions    Readmission Risk Interventions    08/18/2022    9:00 AM 07/18/2022    4:11 PM 04/08/2022    1:43 PM  Readmission Risk Prevention Plan  Transportation Screening Complete Complete Complete  Medication Review Press photographer) Complete Complete Complete  PCP or Specialist appointment within 3-5 days of discharge Complete Complete Complete  HRI or Home Care Consult Complete Complete Not Complete  HRI or Home Care Consult Pt Refusal Comments   Pt is homeless  SW Recovery Care/Counseling Consult Complete Complete Complete  Palliative Care Screening Not Applicable  Not [place] Not Applicable Complete Not Applicable

## 2022-08-21 NOTE — Progress Notes (Signed)
Occupational Therapy Evaluation Patient Details Name: [name] MRN: [id number] DOB: 1966/01/18 Today's Date: 08/21/2022   History of Present Illness Pt is a 56yo M who presented 9/16 with c/o chest pain, SOB, and leg swelling. Found to have B PE. PMH includes asthma, bioplar 1, depression, schizoaffective disorder, CVA, chronic CHF, homelessness, HTN, DM2, cocaine and cannabis use and R AKA.   Clinical Impression   [name] offers a vague description of his PLOF, he endorses being homeless and in a w/c and it is unclear if he performed ADLs and at what level. He currently presents to OT at a min A level with ADLs and stand pivot transfers using the RW. He was very pleasant to work with and motivated to progress with therapy. Within the session he was able to carryover cueing for safety and demonstrated appropriate safety awareness in asking OT for assist when transferring. Pt would benefit from continued acute OT services to facilitate safe d/c and optimize occupational performance. He will need a new w/c as his current one has a broken L castor. Per previous notes, inpatient psych admission is being pursed. If this does not occur, recommend SNF at d/c. Pt reports his current deficits prevent him from accessing homeless shelters and he would benefit from continued OT services at SNF level to maximize independence and safety in the community.       Recommendations for follow up therapy are one component of a multi-disciplinary discharge planning process, led by the attending physician.  Recommendations may be updated based on patient status, additional functional criteria and insurance authorization.   Follow Up Recommendations  Skilled nursing-short term rehab (<3 hours/day)    Assistance Recommended at Discharge Frequent or constant Supervision/Assistance  Patient can return home with the following A little help with walking and/or transfers;A little help with  bathing/dressing/bathroom    Functional Status Assessment  Patient has had a recent decline in their functional status and demonstrates the ability to make significant improvements in function in a reasonable and predictable amount of time.  Equipment Recommendations  Wheelchair (measurements OT) (needs new L castor)    Recommendations for Other Services PT consult     Precautions / Restrictions Precautions Precautions: Fall;Other (comment) (R AKA, L great toe amputation) Restrictions Weight Bearing Restrictions: No      Mobility Bed Mobility Overal bed mobility: Needs Assistance Bed Mobility: Supine to Sit, Sit to Supine     Supine to sit: Supervision Sit to supine: Supervision        Transfers Overall transfer level: Needs assistance Equipment used: Rolling walker (2 wheels) Transfers: Sit to/from Stand, Bed to chair/wheelchair/BSC Sit to Stand: Min guard Stand pivot transfers: Min assist         General transfer comment: Min guard progressing to supervision for sit > stand. min A for stand pivot with the RW      Balance Overall balance assessment: Needs assistance Sitting-balance support: No upper extremity supported Sitting balance-Leahy Scale: Good     Standing balance support: During functional activity, Bilateral upper extremity supported Standing balance-Leahy Scale: Poor                             ADL either performed or assessed with clinical judgement   ADL Overall ADL's : Needs assistance/impaired Eating/Feeding: Set up   Grooming: Set up;Supervision/safety   Upper Body Bathing: Supervision/ safety   Lower Body Bathing: Moderate assistance   Upper Body Dressing :  Minimal assistance   Lower Body Dressing: Moderate assistance   Toilet Transfer: Minimal assistance   Toileting- Clothing Manipulation and Hygiene: Minimal assistance       Functional mobility during ADLs: Minimal assistance;Rolling walker (2 wheels);Cueing  for safety       Vision Baseline Vision/History: 0 No visual deficits Ability to See in Adequate Light: 0 Adequate Patient Visual Report: No change from baseline Vision Assessment?: No apparent visual deficits     Perception     Praxis      Pertinent Vitals/Pain Pain Assessment Pain Assessment: 0-10 Pain Score: 8  Pain Location: Lung and ankle Pain Descriptors / Indicators: Aching, Constant Pain Intervention(s): Patient requesting pain meds-RN notified     Hand Dominance Right   Extremity/Trunk Assessment Upper Extremity Assessment Upper Extremity Assessment: RUE deficits/detail RUE Deficits / Details: Ataxic RUE from prior CVA. Ataxic during all intentional reaching and AROM at the shoulder limited to 90 degrees. MMT 3+/5 overall distally. RUE Sensation: decreased light touch RUE Coordination: decreased gross motor;decreased fine motor   Lower Extremity Assessment Lower Extremity Assessment: Defer to PT evaluation   Cervical / Trunk Assessment Cervical / Trunk Assessment: Normal   Communication Communication Communication: Expressive difficulties   Cognition Arousal/Alertness: Awake/alert Behavior During Therapy: WFL for tasks assessed/performed Overall Cognitive Status: No family/caregiver present to determine baseline cognitive functioning                                 General Comments: Pt oriented to self and place only. He was able to follow all directions and was very pleasant during session. He was able to carryover cueing for UE placement during sit > stand and appropriately waited for OT assist during mobility     General Comments  Pt's 4L O2 on his neck upon entry and Spo2 95%. During activity very inconsistent readings but pt reporting intermittent dizziness so reapplied 2L. RN aware.            Home Living Family/patient expects to be discharged to:: Shelter/Homeless                                        Prior  Functioning/Environment Prior Level of Function : Patient poor historian/Family not available;Independent/Modified Independent               ADLs Comments: Pt with very vague description of PLOF. He was homeless and living on the streets in his w/c. He is unable to state how he completed ADLs or how he got food.        OT Problem List: Decreased strength;Decreased range of motion;Decreased activity tolerance;Impaired balance (sitting and/or standing);Decreased coordination;Decreased cognition;Decreased safety awareness;Decreased knowledge of use of DME or AE;Pain;Impaired UE functional use      OT Treatment/Interventions: Self-care/ADL training;Therapeutic exercise;Patient/family education;Balance training;DME and/or AE instruction;Therapeutic activities;Cognitive remediation/compensation    OT Goals(Current goals can be found in the care plan section) Acute Rehab OT Goals Patient Stated Goal: "get better" OT Goal Formulation: With patient Time For Goal Achievement: 09/04/22 Potential to Achieve Goals: Good  OT Frequency: Min 3X/week       AM-PAC OT "6 Clicks" Daily Activity     Outcome Measure Help from another person eating meals?: None Help from another person taking care of personal grooming?: None Help from another person toileting, which includes using toliet, bedpan,  or urinal?: A Little Help from another person bathing (including washing, rinsing, drying)?: A Little Help from another person to put on and taking off regular upper body clothing?: A Little Help from another person to put on and taking off regular lower body clothing?: A Little 6 Click Score: 20   End of Session Equipment Utilized During Treatment: Gait belt;Rolling walker (2 wheels) Nurse Communication: Mobility status  Activity Tolerance: Patient tolerated treatment well Patient left: in bed;with nursing/sitter in room;with call bell/phone within reach  OT Visit Diagnosis: Unsteadiness on feet  (R26.81);Muscle weakness (generalized) (M62.81);Other abnormalities of gait and mobility (R26.89)                Time: 9211-9417 OT Time Calculation (min): 25 min Charges:  OT General Charges $OT Visit: 1 Visit OT Evaluation $OT Eval Moderate Complexity: 1 Mod OT Treatments $Therapeutic Activity: 8-22 mins  Jake Shark, OTR/L, CBIS Acute Rehab Office: [phone]   Crissie Reese 08/21/2022, 9:52 AM

## 2022-08-21 NOTE — Progress Notes (Signed)
PROGRESS NOTE        PATIENT DETAILS Name: [name] Age: 56 y.o. Sex: male Date of Birth: 04-07-1966 Admit Date: 08/16/2022 Admitting Physician Clance Boll, MD SQ:[id number], [name], NP  Brief Summary: Patient is a 56 y.o.  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  He was subsequently started on anticoagulation and admitted to the hospitalist service.  Further hospital course was complicated by patient having suicidal ideation-psychiatry was consulted-bedside sitter was placed-with recommendations to pursue inpatient psychiatric admission.  See below for further details.  Significant events: 5/4-5/12>> hospitalization for sepsis from cellulitis from chronic lower extremity wound. 8/12-8/25>> hospitalization for HFrEF exacerbation/hypoxia 9/16>> admit to [facility]-pleuritic chest pain/shortness of breath-CTA with bilateral PE and pulmonary infarct.  Significant studies: 9/16>> CTA chest: Bilateral PE-right lung infarct. 9/16>> bilateral lower extremity angiogram: No major stenosis. 9/17>> x-ray left ankle: No fracture/dislocation. 9/17>> UDS: Positive for opiates/cocaine 9/18>> lower extremity Doppler: No DVT 9/18>> Limited echo: [id number], RV systolic function moderately reduced. 9/20>> CXR: Slight worsening of peripheral density in the right mid/lower lung.  Significant microbiology data: None  Procedures: None  Consults: Psychiatry  Subjective: Lying comfortably in bed-denies any chest pain or shortness of breath.  Some cough but otherwise stable.  Has known right facial droop slightly dysarthric.  Objective: Vitals: Blood pressure 99/62, pulse 78,  temperature 97.9 F (36.6 C), temperature source Oral, resp. rate 19, height 5\' 9"  (1.753 m), weight 86.2 kg, SpO2 99 %.   Exam: Gen Exam:Alert awake-not in any distress HEENT:atraumatic, normocephalic Chest: B/L clear to auscultation anteriorly CVS:S1S2 regular Abdomen:soft non tender, non distended Extremities:no edema.  Right AKA-left great toe amputation. Neurology: Right sided hemiparesis-right facial droop. Skin: no rash  Pertinent Labs/Radiology:    Latest Ref Rng & Units 08/18/2022    2:49 PM 08/16/2022    7:39 PM 08/16/2022    5:17 PM  CBC  WBC 4.0 - 10.5 K/uL 9.2     Hemoglobin 13.0 - 17.0 g/dL 12.0  12.2  8.5   Hematocrit 39.0 - 52.0 % 36.6  36.0  25.0   Platelets 150 - 400 K/uL 256       Lab Results  Component Value Date   NA 135 08/18/2022   K 4.7 08/18/2022   CL 103 08/18/2022   CO2 17 (L) 08/18/2022      Assessment/Plan: Pulmonary embolism with right lung infarct: Continues to have some pleuritic chest pain-continue Eliquis.  Suspect etiology to noncompliance to do coagulation-as prior history of VTE.  Likely needs indefinite anticoagulation.  Chronic HFpEF/HFrEF: Euvolemic-continue Lasix/Coreg/lisinopril.  If he is compliant with this regimen-he could potentially be switched to Entresto at some point-we will need follow-up with cardiology in the outpatient setting.  Cough: Suspect from GERD/lung infarct-although at risk for aspiration-do not think he has pneumonia-he may have chemical pneumonitis.  Stop Augmentin-and monitor.  Maintain aspiration precautions at all times.  Suicidal ideation: Appreciate psych input-awaiting inpatient psychiatric bed.  History of bipolar disorder: Continue Abilify.  History of CVA with right hemiparesis/facial droop: On anticoagulation  Peripheral neuropathy: Continues to have intermittent mostly left lower extremity pain-continue scheduled Neurontin-and as needed oxycodone  for breakthrough pain.  DM-2 (A1c 6.0 on 9/17):  CBG stable with SSI.  Recent Labs    08/21/22 0013 08/21/22 0705 08/21/22 0848  GLUCAP 91 110* 119*    History of right AKA-left great toe amputation  COPD: Not in exacerbation-start Brovana/Yupelri/budesonide- will convert to inhaler regimen when closer to discharge.  Polysubstance abuse: Counseled  Homelessness: Very vague about his situation-claims he sleeps wherever he can-claims he is on a wheelchair (right AKA/right hemiparesis).  Get PT/OT eval to see what his functionality is like.  BMI: Estimated body mass index is 28.06 kg/m as calculated from the following:   Height as of this encounter: 5\' 9"  (1.753 m).   Weight as of this encounter: 86.2 kg.   Code status:   Code Status: Full Code   DVT Prophylaxis: apixaban (ELIQUIS) tablet 10 mg  apixaban (ELIQUIS) tablet 5 mg     Family Communication: None at bedside   Disposition Plan: Status is: Inpatient Remains inpatient appropriate because: Suicidal ideation-sitter-awaiting inpatient psych bed.   Planned Discharge Destination: Inpatient psych bed-otherwise will likely require SNF.   Diet: Diet Order             Diet heart healthy/carb modified Room service appropriate? Yes; Fluid consistency: Thin  Diet effective now                     Antimicrobial agents: Anti-infectives (From admission, onward)    Start     Dose/Rate Route Frequency Ordered Stop   08/20/22 1345  amoxicillin-clavulanate (AUGMENTIN) 875-125 MG per tablet 1 tablet        1 tablet Oral Every 12 hours 08/20/22 1248          MEDICATIONS: Scheduled Meds:  amoxicillin-clavulanate  1 tablet Oral Q12H   apixaban  10 mg Oral BID   Followed by   Derrill Memo ON 08/24/2022] apixaban  5 mg Oral BID   ARIPiprazole  7.5 mg Oral Daily   carvedilol  3.125 mg Oral BID WC   folic acid  1 mg Oral Daily   furosemide  40 mg Oral BID   gabapentin  400 mg Oral TID   insulin aspart  0-9 Units Subcutaneous TID WC   lisinopril  10 mg Oral Daily    thiamine  100 mg Oral Daily   Continuous Infusions: PRN Meds:.hydrALAZINE   I have personally reviewed following labs and imaging studies  LABORATORY DATA: CBC: Recent Labs  Lab 08/16/22 1542 08/16/22 1717 08/16/22 1939 08/18/22 1449  WBC 5.6  --   --  9.2  NEUTROABS 3.0  --   --  6.2  HGB 10.7* 8.5* 12.2* 12.0*  HCT 34.9* 25.0* 36.0* 36.6*  MCV 94.1  --   --  89.7  PLT 250  --   --  607    Basic Metabolic Panel: Recent Labs  Lab 08/16/22 1542 08/16/22 1717 08/16/22 1912 08/16/22 1939 08/17/22 0903 08/18/22 0741  NA 141 143 136 138 138 135  K 2.4* 2.4* 3.7 3.7 3.8 4.7  CL 117*  --  105  --  104 103  CO2 14*  --  21*  --  24 17*  GLUCOSE 63*  --  116*  --  102* 89  BUN 9  --  11  --  9 11  CREATININE 0.53*  --  0.92  --  1.02 0.86  CALCIUM 5.7*  --  8.5*  --  8.3* 8.0*  MG  --   --  1.8  --  1.8  --     GFR: Estimated Creatinine Clearance: 106.8 mL/min (by C-G formula based on SCr of 0.86 mg/dL).  Liver Function Tests: Recent Labs  Lab 08/16/22 1542 08/16/22 1912 08/17/22 0903  AST 10* 15 17  ALT 7 9 7   ALKPHOS 51 73 74  BILITOT 0.9 1.4* 1.0  PROT 4.5* 7.0 6.1*  ALBUMIN 2.0* 2.8* 2.7*   No results for input(s): "LIPASE", "AMYLASE" in the last 168 hours. No results for input(s): "AMMONIA" in the last 168 hours.  Coagulation Profile: No results for input(s): "INR", "PROTIME" in the last 168 hours.  Cardiac Enzymes: No results for input(s): "CKTOTAL", "CKMB", "CKMBINDEX", "TROPONINI" in the last 168 hours.  BNP (last 3 results) No results for input(s): "PROBNP" in the last 8760 hours.  Lipid Profile: No results for input(s): "CHOL", "HDL", "LDLCALC", "TRIG", "CHOLHDL", "LDLDIRECT" in the last 72 hours.  Thyroid Function Tests: No results for input(s): "TSH", "T4TOTAL", "FREET4", "T3FREE", "THYROIDAB" in the last 72 hours.  Anemia Panel: No results for input(s): "VITAMINB12", "FOLATE", "FERRITIN", "TIBC", "IRON", "RETICCTPCT" in the last 72  hours.  Urine analysis:    Component Value Date/Time   COLORURINE YELLOW 08/17/2022 0625   APPEARANCEUR CLEAR 08/17/2022 0625   LABSPEC 1.021 08/17/2022 0625   PHURINE 5.0 08/17/2022 0625   GLUCOSEU NEGATIVE 08/17/2022 0625   HGBUR NEGATIVE 08/17/2022 0625   BILIRUBINUR NEGATIVE 08/17/2022 0625   KETONESUR NEGATIVE 08/17/2022 0625   PROTEINUR NEGATIVE 08/17/2022 0625   NITRITE NEGATIVE 08/17/2022 0625   LEUKOCYTESUR NEGATIVE 08/17/2022 0625    Sepsis Labs: Lactic Acid, Venous    Component Value Date/Time   LATICACIDVEN 0.9 07/13/2022 2058    MICROBIOLOGY: No results found for this or any previous visit (from the past 240 hour(s)).  RADIOLOGY STUDIES/RESULTS: DG CHEST PORT 1 VIEW  Result Date: 08/20/2022 CLINICAL DATA:  Asthma.  Hypoxia. EXAM: PORTABLE CHEST 1 VIEW COMPARISON:  08/16/2022 FINDINGS: Mild cardiomegaly. Emphysema affecting both upper lungs. The left lung is otherwise clear. Pulmonary opacity in the right lateral mid and lower lung is more pronounced and consistent with infarction or pneumonia based on the prior CT. This appears slightly more pronounced by radiography. Small amount of pleural fluid accumulating on the right. IMPRESSION: Slight worsening of peripheral pulmonary density in the right mid and lower lung which could be due to infarction or pneumonia on the basis of the prior CT study. Small amount of pleural fluid on the right. Background emphysema. Electronically Signed   By: Nelson Chimes M.D.   On: 08/20/2022 08:33     LOS: 4 days   Oren Binet, MD  [facility]    To contact the attending provider between 7A-7P or the covering provider during after hours 7P-7A, please log into the web site [email or web address] and access using universal [place] password for that web site. If you do not have the password, please call the hospital operator.  08/21/2022, 9:40 AM

## 2022-08-21 NOTE — Evaluation (Signed)
Clinical/Bedside Swallow Evaluation Patient Details  Name: Leslee Haueter MRN: [id number] Date of Birth: [birth year]  Today's Date: 08/21/2022 Time: SLP Start Time (ACUTE ONLY): 25 SLP Stop Time (ACUTE ONLY): 1020 SLP Time Calculation (min) (ACUTE ONLY): 10 min  Past Medical History:  Past Medical History:  Diagnosis Date   Asthma    Bipolar 1 disorder ([place])    CHF (congestive heart failure) ([facility])    COPD (chronic obstructive pulmonary disease) ([place])    Depression    Diabetes mellitus without complication ([place])    GERD (gastroesophageal reflux disease)    Homelessness    Hypertension    QT prolongation    Schizoaffective disorder ([place])    Past Surgical History:  Past Surgical History:  Procedure Laterality Date   AMPUTATION TOE Bilateral 01/02/2021   Procedure: AMPUTATION TOE;  Surgeon: Evelina Bucy, DPM;  Location: [facility];  Service: Podiatry;  Laterality: Bilateral;  Ok to leave patient on hospital bed   IR ANGIO INTRA EXTRACRAN SEL COM CAROTID INNOMINATE BILAT MOD SED  12/17/2020   IR ANGIO VERTEBRAL SEL VERTEBRAL BILAT MOD SED  12/17/2020   RADIOLOGY WITH ANESTHESIA N/A 12/17/2020   Procedure: IR WITH ANESTHESIA;  Surgeon: Radiologist, Medication, MD;  Location: [facility];  Service: Radiology;  Laterality: N/A;   HPI:  Pt is a 56 year old male with multiple medical problems including noncompliance, admitted with bialteral PE. MD writes "Chest repeated which shows slightly worsening of peripheral pulmonary density in the right mid and lower lung which could be infarction or pneumonia.  Procalcitonin unremarkable, but I wonder if he is having recurrent aspiration causing aspiration pneumonia since he already has a stroke and left facial droop.  We will consult SLP.  Patient has no respiratory symptoms." Most recent MRI 01/29/22 shows chronic cerebellar infarcts, nothing acute. Pt had brief dysphagia following 4 day intubation on 12/20/20 after initial CVA, quickly resolved. Pt further  treated for dysarthria and cognition only.    Assessment / Plan / Recommendation  Clinical Impression  Pt demonstrates no signs of dysphagia or aspiration. He is missing dentition and prefers moist meats, but is able to masticate adequately. 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 Unlikely that pt suffers from aspiraiton when alert and self feeding. He does have a history of substance abuse and food insecurity which can lead to increased risk of malnutrition, or even aspiration of reflux if intoxicated. Would not advise any diet modification or restriction for this pt who shows no overt signs of dysphagia. Will sign off. SLP Visit Diagnosis: Dysphagia, unspecified (R13.10)    Aspiration Risk  Mild aspiration risk    Diet Recommendation Regular;Thin liquid   Liquid Administration via: Cup;Straw Medication Administration: Whole meds with liquid Supervision: Patient able to self feed Postural Changes: Seated upright at 90 degrees    Other  Recommendations      Recommendations for follow up therapy are one component of a multi-disciplinary discharge planning process, led by the attending physician.  Recommendations may be updated based on patient status, additional functional criteria and insurance authorization.  Follow up Recommendations No SLP follow up      Assistance Recommended at Discharge    Functional Status Assessment    Frequency and Duration            Prognosis        Swallow Study   General HPI: Pt is a 56  year old male with multiple medical problems including noncompliance, admitted with bialteral PE. MD writes "Chest repeated which shows slightly worsening of peripheral pulmonary density in the right mid and lower lung which could be infarction or pneumonia.  Procalcitonin unremarkable, but I wonder if he is having recurrent aspiration causing aspiration pneumonia  since he already has a stroke and left facial droop.  We will consult SLP.  Patient has no respiratory symptoms." Most recent MRI 01/29/22 shows chronic cerebellar infarcts, nothing acute. Pt had brief dysphagia following 4 day intubation on 12/20/20 after initial CVA, quickly resolved. Pt further treated for dysarthria and cognition only. Type of Study: Bedside Swallow Evaluation Diet Prior to this Study: Regular;Thin liquids Temperature Spikes Noted: No Respiratory Status: Nasal cannula History of Recent Intubation: No Behavior/Cognition: Alert;Cooperative;Pleasant mood Oral Cavity Assessment: Within Functional Limits Oral Care Completed by SLP: No Oral Cavity - Dentition: Poor condition;Missing dentition Vision: Functional for self-feeding Self-Feeding Abilities: Able to feed self Patient Positioning:  (elevated sidelying) Baseline Vocal Quality: Normal Volitional Cough: Strong Volitional Swallow: Able to elicit    Oral/Motor/Sensory Function Overall Oral Motor/Sensory Function: Other (comment) (poor coordination - ataxia, no weakness)   Ice Chips Ice chips: Not tested   Thin Liquid Thin Liquid: Within functional limits Presentation: Straw;Self Fed    Nectar Thick Nectar Thick Liquid: Not tested   Honey Thick Honey Thick Liquid: Not tested   Puree Puree: Within functional limits   Solid     Solid: Within functional limits      Alanya Vukelich, Riley Nearing 08/21/2022,10:50 AM

## 2022-08-21 NOTE — Consult Note (Signed)
[facility] Psychiatry Followup Face-to-Face Psychiatric Evaluation   Name: [name] DOB: [birth year] MRN: [id number] Service Date: August 21, 2022 LOS:  LOS: 4 days  Reason for Consult: "Suicidal ideation" Referring Provider: Hughie Closs, MD   Assessment  [name] is a 56 y.o. male admitted medically for 08/16/2022  3:03 PM for chest pain, dyspnea, and suicidal ideation for the past 2 weeks. He carries the psychiatric diagnoses of schizoaffective disorder bipolar type and has a past medical history of T2DM, DVT, chronic combined diastolic systolic heart failure, right AKA, and normocytic anemia.   His current presentation is most consistent with suicidal ideation in the depressive episode of schizoaffective bipolar type, as a sequelae of current physical symptomology. He continues to meet criteria for inpatient psychiatric admission based on the fact that he is not psychotic and actively endorsing SI with a plan and intent. However, historically, patient has a history of malingering, and currently has a sense of impending doom from his PE that seems to be the primary driver in his desire to end his life, which patient states on assessment today. Current outpatient psychotropic medications include Abilify 5 mg and historically he has had a good response to this medication. His home medication has since been increased to 7.5 mg, with plan to continue to titrate as needed. He was not compliant with medications prior to admission as evidenced by patient initial reporting. He has been medication compliant thus far this admission. Please see plan below for detailed recommendations.   Diagnoses:  Active Hospital problems: Principal Problem:   Pulmonary emboli (HCC) Active Problems:   Cocaine use disorder, severe, dependence (HCC)   Cocaine abuse with cocaine-induced mood disorder (HCC)   Homeless   Suicidal ideation   Essential hypertension   Bipolar I disorder (HCC)    Chronic combined systolic and diastolic congestive heart failure (HCC)     Plan  ## Safety and Observation Level:  - Based on my clinical evaluation, I estimate the patient to be at moderate risk of self harm in the current setting - At this time, we recommend continuing 1:1 level of observation. This decision is based on my review of the chart including patient's history and current presentation, interview of the patient, mental status examination, and consideration of suicide risk including evaluating suicidal ideation, plan, intent, suicidal or self-harm behaviors, risk factors, and protective factors. This judgment is based on our ability to directly address suicide risk, implement suicide prevention strategies and develop a safety plan while the patient is in the clinical setting. Please contact our team if there is a concern that risk level has changed.   ## Medications:  -- Increase to Abilify 10 mg daily (Patient given 7.5 mg this AM, so will give additional 2.5 mg, then 10 mg as a single dose tomorrow).  ## Medical Decision Making Capacity:  Formal decision making capacity was not assessed for any particular decision as part of routine psychiatric evaluation.   ## Further Work-up:  -Recommend maintaining electrolytes, K+ greater than 4 and Mg greater than 2   -- most recent EKG on 9/19 had QtC of 497; corrected QTc Bazett 413 -- Pertinent labwork reviewed earlier this admission includes:  UDS positive cocaine and opiates Calcium 8.0, but albumin 2.7 Potassium 2.4 on admission, 4.7 today TSH 1.974 A1c 6.0% Magnesium 1.8   ## Disposition:  -- At this time, would continue to recommend inpatient psychiatric admission; appreciate [name] assistance in placement.  ## Behavioral / Environmental:  --  1:1 sitter -Fall precautions, per primary team  ##Legal Status Patient currently voluntary  Thank you for this consult request. Recommendations have been communicated to the primary  team.  We will continue to follow at this time.   [name] Sprinkles, MD   Follow-Up history  Relevant Aspects of Hospital Course:  Admitted on 08/16/2022 for left lower extremity swelling and dyspnea, found to have PE.  NAEON, and no behavioral PRNs required.  Patient Report:  Patient again reports not feeling well due to physical symptoms.  He also reports ongoing suicidal ideation, command hallucinations telling him to rollover in traffic, and decreased will to live.  He asks about his care plan moving forward to which we discuss medications, and he agrees to increase his dose of Abilify. We also discuss how the primary recommendation is for inpatient psychiatric admission due to his ongoing suicidality.  He is informed that [facility] in [place] does not have availability until mid October, to which he responds "that is too long."   He denies adverse effects of medications. He continues to report chest pain and difficulty breathing 2/2 his PE, although he appears to have less work of breathing in comparison to prior days.  He also reports having a bowel movement.  Patient denies any acute concerns or complaints today, and is appreciative of the plan.   Collateral information:  No collateral obtained today.   Psychiatric History:  Information collected from patient, chart  Per Patient and old records-Depression, Schizoaffective disorder,hx of bipolar, schizophrenia and cocaine abuse. Pt reports several hospitalizations , more than he is able to count and has had several suicide attempts .   Family psych history: reported mom committed suicide and died but did not say what she did.   Social History:  Homeless, ambulates with walker and wheelchair  Tobacco use: Denies intermittent use, but would not detail amount Alcohol use: Denies current use Drug use: Cocaine use, with last use the night before ED presentation  Family History:   The patient's family history includes  Drug abuse in his mother; Suicidality in his mother.  Medical History: Past Medical History:  Diagnosis Date   Asthma    Bipolar 1 disorder (HCC)    CHF (congestive heart failure) (HCC)    COPD (chronic obstructive pulmonary disease) (HCC)    Depression    Diabetes mellitus without complication (HCC)    GERD (gastroesophageal reflux disease)    Homelessness    Hypertension    QT prolongation    Schizoaffective disorder [facility])     Surgical History: Past Surgical History:  Procedure Laterality Date   AMPUTATION TOE Bilateral 01/02/2021   Procedure: AMPUTATION TOE;  Surgeon: Park Liter, DPM;  Location: [facility] OR;  Service: Podiatry;  Laterality: Bilateral;  Ok to leave patient on hospital bed   IR ANGIO INTRA EXTRACRAN SEL COM CAROTID INNOMINATE BILAT MOD SED  12/17/2020   IR ANGIO VERTEBRAL SEL VERTEBRAL BILAT MOD SED  12/17/2020   RADIOLOGY WITH ANESTHESIA N/A 12/17/2020   Procedure: IR WITH ANESTHESIA;  Surgeon: Radiologist, Medication, MD;  Location: [facility] OR;  Service: Radiology;  Laterality: N/A;    Medications:   Current Facility-Administered Medications:    apixaban (ELIQUIS) tablet 10 mg, 10 mg, Oral, BID, 10 mg at 08/21/22 1117 **FOLLOWED BY** [START ON 08/24/2022] apixaban (ELIQUIS) tablet 5 mg, 5 mg, Oral, BID, Bell, Lorin C, RPH   arformoterol (BROVANA) nebulizer solution 15 mcg, 15 mcg, Nebulization, BID, Ghimire, Werner Lean, MD  ARIPiprazole (ABILIFY) tablet 7.5 mg, 7.5 mg, Oral, Daily, Travious Vanover, MD, 7.5 mg at 08/21/22 1116   budesonide (PULMICORT) nebulizer solution 0.25 mg, 0.25 mg, Nebulization, BID, Ghimire, Shanker M, MD   carvedilol (COREG) tablet 3.125 mg, 3.125 mg, Oral, BID WC, Myles Rosenthal A, MD, 3.125 mg at 02/63/78 5885   folic acid (FOLVITE) tablet 1 mg, 1 mg, Oral, Daily, Myles Rosenthal A, MD, 1 mg at 08/21/22 1117   furosemide (LASIX) tablet 40 mg, 40 mg, Oral, BID, Myles Rosenthal A, MD, 40 mg at 08/21/22 1118   gabapentin (NEURONTIN)  capsule 400 mg, 400 mg, Oral, TID, Myles Rosenthal A, MD, 400 mg at 08/21/22 1117   hydrALAZINE (APRESOLINE) tablet 25 mg, 25 mg, Oral, Q6H PRN, Pahwani, Ravi, MD   insulin aspart (novoLOG) injection 0-9 Units, 0-9 Units, Subcutaneous, TID WC, Myles Rosenthal A, MD, 1 Units at 08/20/22 1708   lisinopril (ZESTRIL) tablet 10 mg, 10 mg, Oral, Daily, Pahwani, Ravi, MD, 10 mg at 08/21/22 1118   oxyCODONE (Oxy IR/ROXICODONE) immediate release tablet 5 mg, 5 mg, Oral, Q6H PRN, Jonetta Osgood, MD, 5 mg at 08/21/22 1118   revefenacin (YUPELRI) nebulizer solution 175 mcg, 175 mcg, Nebulization, Daily, Ghimire, Shanker M, MD   thiamine (VITAMIN B1) tablet 100 mg, 100 mg, Oral, Daily, Myles Rosenthal A, MD, 100 mg at 08/21/22 1118  Allergies: Allergies  Allergen Reactions   Motrin [Ibuprofen] Anaphylaxis   Tylenol [Acetaminophen] Hives, Nausea And Vomiting and Rash   Lyrica Cr [Pregabalin Er] Other (See Comments)    Dystonia Dysarthria    Peanut-Containing Drug Products Other (See Comments)    Pt denies allergy to peanut butter 03/02/2021       Objective  Vital signs:  Temp:  [97.8 F (36.6 C)-98.4 F (36.9 C)] 97.8 F (36.6 C) (09/21 1117) Pulse Rate:  [78-85] 81 (09/21 1117) Resp:  [17-20] 19 (09/21 0800) BP: (99-143)/(62-102) 109/70 (09/21 1117) SpO2:  [95 %-100 %] 99 % (09/21 0800)  Psychiatric Specialty Exam:  Presentation  General Appearance: Appropriate for Environment (In hospital gown)  Eye Contact:Good  Speech:Clear and Coherent; Normal Rate  Speech Volume:Normal  Handedness:Right   Mood and Affect  Mood:Depressed; Irritable  Affect:Congruent; Depressed   Thought Process  Thought Processes:Coherent; Linear  Descriptions of Associations:Intact  Orientation:Full (Time, Place and Person)  Thought Content:Logical; WDL  History of Schizophrenia/Schizoaffective disorder:Yes  Duration of Psychotic Symptoms:Greater than six months  Hallucinations:  Command auditory hallucinations, no improvements in volume nor frequency  Ideas of Reference:None  Suicidal Thoughts: Denies  Homicidal Thoughts:Denies  Sensorium  Memory:Immediate Fair; Recent Fair  Judgment:Poor  Insight:Fair   Executive Functions  Concentration:Fair  Attention Span:Fair  [place]   Psychomotor Activity  Psychomotor Activity: Normal  Assets  Assets:Communication Skills; Desire for Improvement; Financial Resources/Insurance   Sleep  Sleep: Fair   Physical Exam: Physical Exam Vitals reviewed.  Constitutional:      General: He is in acute distress.     Appearance: He is ill-appearing and diaphoretic.  Pulmonary:     Effort: Respiratory distress present.     Comments: Nasal cannula in place Musculoskeletal:     Comments: R AKA  Neurological:     Mental Status: He is alert.    Blood pressure 109/70, pulse 81, temperature 97.8 F (36.6 C), temperature source Oral, resp. rate 19, height 5\' 9"  (1.753 m), weight 86.2 kg, SpO2 99 %. Body mass index is 28.06 kg/m.

## 2022-08-22 DIAGNOSIS — F319 Bipolar disorder, unspecified: Secondary | ICD-10-CM | POA: Diagnosis not present

## 2022-08-22 DIAGNOSIS — I5042 Chronic combined systolic (congestive) and diastolic (congestive) heart failure: Secondary | ICD-10-CM | POA: Diagnosis not present

## 2022-08-22 DIAGNOSIS — I2699 Other pulmonary embolism without acute cor pulmonale: Secondary | ICD-10-CM | POA: Diagnosis not present

## 2022-08-22 DIAGNOSIS — F1414 Cocaine abuse with cocaine-induced mood disorder: Secondary | ICD-10-CM | POA: Diagnosis not present

## 2022-08-22 LAB — GLUCOSE, CAPILLARY
Glucose-Capillary: 173 mg/dL — ABNORMAL HIGH (ref 70–99)
Glucose-Capillary: 133 mg/dL — ABNORMAL HIGH (ref 70–99)
Glucose-Capillary: 132 mg/dL — ABNORMAL HIGH (ref 70–99)
Glucose-Capillary: 125 mg/dL — ABNORMAL HIGH (ref 70–99)

## 2022-08-22 NOTE — TOC Progression Note (Addendum)
Transition of Care [facility]) - Progression Note    Patient Details  Name: [name] MRN: [id number] Date of Birth: 17-Dec-1965  Transition of Care [facility]) CM/SW [place], [name] Phone Number: 08/22/2022, 8:54 AM  Clinical Narrative:    Faxed referral to [facility] again, however now therapy recommending SNF so likely will be denied.     Expected Discharge Plan: Home/Self Care Barriers to Discharge: Continued Medical Work up  Expected Discharge Plan and Services Expected Discharge Plan: Home/Self Care In-house Referral: Clinical Social Work     Living arrangements for the past 2 months: Homeless                                       Social Determinants of Health ([place]) Interventions    Readmission Risk Interventions    08/18/2022    9:00 AM 07/18/2022    4:11 PM 04/08/2022    1:43 PM  Readmission Risk Prevention Plan  Transportation Screening Complete Complete Complete  Medication Review Press photographer) Complete Complete Complete  PCP or Specialist appointment within 3-5 days of discharge Complete Complete Complete  HRI or Home Care Consult Complete Complete Not Complete  HRI or Home Care Consult Pt Refusal Comments   Pt is homeless  SW Recovery Care/Counseling Consult Complete Complete Complete  Palliative Care Screening Not Applicable  Not [place] Not Applicable Complete Not Applicable

## 2022-08-22 NOTE — Progress Notes (Signed)
Patient refused for his HS CBG checks. Patient educated. MD notified

## 2022-08-22 NOTE — Consult Note (Signed)
[place] Psychiatry Followup Face-to-Face Psychiatric Evaluation   Name: [name] DOB: Apr 25, 1966 MRN: [id number] Service Date: August 22, 2022 LOS:  LOS: 5 days  Reason for Consult: "Suicidal ideation" Referring Provider: Darliss Cheney, MD   Assessment  [name] is a 56 y.o. male admitted medically for 08/16/2022  3:03 PM for chest pain, dyspnea, and suicidal ideation for the past 2 weeks. He carries the psychiatric diagnoses of schizoaffective disorder bipolar type and has a past medical history of T2DM, DVT, chronic combined diastolic systolic heart failure, right AKA, and normocytic anemia.   His current presentation is most consistent with suicidal ideation in the depressive episode of schizoaffective bipolar type, as a sequelae of current physical symptomology. He continues to meet criteria for inpatient psychiatric admission based on the fact that he is not psychotic and actively endorsing SI with a plan and intent. However, historically, patient has a history of malingering, and currently has a sense of impending doom from his PE that seems to be the primary driver in his desire to end his life, which patient states on assessment today. Current outpatient psychotropic medications include Abilify 5 mg and historically he has had a good response to this medication. His home medication has since been increased to 10 mg, with plan to continue to titrate as needed. He was not compliant with medications prior to admission as evidenced by patient initial reporting. He has been medication compliant thus far this admission. Please see plan below for detailed recommendations.   Diagnoses:  Active Hospital problems: Principal Problem:   Pulmonary emboli (HCC) Active Problems:   Cocaine use disorder, severe, dependence (HCC)   Cocaine abuse with cocaine-induced mood disorder ([place])   Homeless   Suicidal ideation   Essential hypertension   Bipolar I disorder (HCC)   Chronic  combined systolic and diastolic congestive heart failure ([place])     Plan  ## Safety and Observation Level:  - Based on my clinical evaluation, I estimate the patient to be at moderate risk of self harm in the current setting - At this time, we recommend continuing 1:1 level of observation. This decision is based on my review of the chart including patient's history and current presentation, interview of the patient, mental status examination, and consideration of suicide risk including evaluating suicidal ideation, plan, intent, suicidal or self-harm behaviors, risk factors, and protective factors. This judgment is based on our ability to directly address suicide risk, implement suicide prevention strategies and develop a safety plan while the patient is in the clinical setting. Please contact our team if there is a concern that risk level has changed.   ## Medications:  -- Continue Abilify 10 mg daily   ## Medical Decision Making Capacity:  Formal decision making capacity was not assessed for any particular decision as part of routine psychiatric evaluation.   ## Further Work-up:  -Recommend maintaining electrolytes, K+ greater than 4 and Mg greater than 2   -- most recent EKG on 9/19 had QtC of 497; corrected QTc Bazett 413 -- Pertinent labwork reviewed earlier this admission includes:  UDS positive cocaine and opiates Calcium 8.0, but albumin 2.7 Potassium 2.4 on admission, 4.7 today TSH 1.974 A1c 6.0% Magnesium 1.8   ## Disposition:  -- At this time, would continue to recommend inpatient psychiatric admission; appreciate [name] assistance in placement.  ## Behavioral / Environmental:  -- 1:1 sitter -Fall precautions, per primary team  ##Legal Status Patient currently voluntary  Thank you for this consult request.  Recommendations have been communicated to the primary team.  We will continue to follow at this time.   [name], MD   Follow-Up history  Relevant  Aspects of Hospital Course:  Admitted on 08/16/2022 for left lower extremity swelling and dyspnea, found to have PE.  NAEON, and no behavioral PRNs required.  Patient Report:  Patient reports feeling a bit better today overall, as his breathing and chest pain have improved, as long as he is not coughing.  He is much more pleasant in interaction in comparison to previous days.  He reports ongoing suicidal ideation and command hallucinations telling him to rollover in traffic.  These appear chronic in nature, despite his mood at the time of reporting suicidality.  He is tolerating the increased dose of Abilify well, and denies adverse effects of medication. We discuss how the primary recommendation is for inpatient psychiatric admission due to his ongoing suicidality, but we are coordinating with his primary medical team as to the best next step, to which he voices understanding. Patient denies any acute concerns or complaints today, and is appreciative of the plan.   Collateral information:  No collateral obtained today.   Psychiatric History:  Information collected from patient, chart  Per Patient and old records-Depression, Schizoaffective disorder,hx of bipolar, schizophrenia and cocaine abuse. Pt reports several hospitalizations , more than he is able to count and has had several suicide attempts .   Family psych history: reported mom committed suicide and died but did not say what she did.   Social History:  Homeless, ambulates with walker and wheelchair  Tobacco use: Denies intermittent use, but would not detail amount Alcohol use: Denies current use Drug use: Cocaine use, with last use the night before ED presentation  Family History:   The patient's family history includes Drug abuse in his mother; Suicidality in his mother.  Medical History: Past Medical History:  Diagnosis Date   Asthma    Bipolar 1 disorder (HCC)    CHF (congestive heart failure) (HCC)    COPD (chronic  obstructive pulmonary disease) (HCC)    Depression    Diabetes mellitus without complication (HCC)    GERD (gastroesophageal reflux disease)    Homelessness    Hypertension    QT prolongation    Schizoaffective disorder [facility])     Surgical History: Past Surgical History:  Procedure Laterality Date   AMPUTATION TOE Bilateral 01/02/2021   Procedure: AMPUTATION TOE;  Surgeon: Park Liter, DPM;  Location: [facility] OR;  Service: Podiatry;  Laterality: Bilateral;  Ok to leave patient on hospital bed   IR ANGIO INTRA EXTRACRAN SEL COM CAROTID INNOMINATE BILAT MOD SED  12/17/2020   IR ANGIO VERTEBRAL SEL VERTEBRAL BILAT MOD SED  12/17/2020   RADIOLOGY WITH ANESTHESIA N/A 12/17/2020   Procedure: IR WITH ANESTHESIA;  Surgeon: Radiologist, Medication, MD;  Location: [facility] OR;  Service: Radiology;  Laterality: N/A;    Medications:   Current Facility-Administered Medications:    apixaban (ELIQUIS) tablet 10 mg, 10 mg, Oral, BID, 10 mg at 08/22/22 0912 **FOLLOWED BY** [START ON 08/24/2022] apixaban (ELIQUIS) tablet 5 mg, 5 mg, Oral, BID, Bell, Lorin C, RPH   arformoterol (BROVANA) nebulizer solution 15 mcg, 15 mcg, Nebulization, BID, Ghimire, Werner Lean, MD, 15 mcg at 08/22/22 0724   ARIPiprazole (ABILIFY) tablet 10 mg, 10 mg, Oral, Daily, Lateef Juncaj, MD, 10 mg at 08/22/22 0913   budesonide (PULMICORT) nebulizer solution 0.25 mg, 0.25 mg, Nebulization, BID, Ghimire, Werner Lean, MD, 0.25 mg at  08/22/22 0724   carvedilol (COREG) tablet 3.125 mg, 3.125 mg, Oral, BID WC, Skip Mayer A, MD, 3.125 mg at 08/22/22 0912   folic acid (FOLVITE) tablet 1 mg, 1 mg, Oral, Daily, Skip Mayer A, MD, 1 mg at 08/22/22 0912   furosemide (LASIX) tablet 40 mg, 40 mg, Oral, BID, Skip Mayer A, MD, 40 mg at 08/22/22 0911   gabapentin (NEURONTIN) capsule 400 mg, 400 mg, Oral, TID, Skip Mayer A, MD, 400 mg at 08/22/22 0932   hydrALAZINE (APRESOLINE) tablet 25 mg, 25 mg, Oral, Q6H PRN, Pahwani, Ravi, MD    insulin aspart (novoLOG) injection 0-9 Units, 0-9 Units, Subcutaneous, TID WC, Skip Mayer A, MD, 1 Units at 08/22/22 1316   lisinopril (ZESTRIL) tablet 10 mg, 10 mg, Oral, Daily, Pahwani, Ravi, MD, 10 mg at 08/22/22 3149   oxyCODONE (Oxy IR/ROXICODONE) immediate release tablet 5 mg, 5 mg, Oral, Q6H PRN, Maretta Bees, MD, 5 mg at 08/22/22 0912   revefenacin (YUPELRI) nebulizer solution 175 mcg, 175 mcg, Nebulization, Daily, Ghimire, Werner Lean, MD, 175 mcg at 08/22/22 7026   thiamine (VITAMIN B1) tablet 100 mg, 100 mg, Oral, Daily, Skip Mayer A, MD, 100 mg at 08/22/22 0913  Allergies: Allergies  Allergen Reactions   Motrin [Ibuprofen] Anaphylaxis   Tylenol [Acetaminophen] Hives, Nausea And Vomiting and Rash   Lyrica Cr [Pregabalin Er] Other (See Comments)    Dystonia Dysarthria    Peanut-Containing Drug Products Other (See Comments)    Pt denies allergy to peanut butter 03/02/2021       Objective  Vital signs:  Temp:  [98 F (36.7 C)-98.1 F (36.7 C)] 98.1 F (36.7 C) (09/22 1232) Pulse Rate:  [74-84] 83 (09/22 1600) Resp:  [15-20] 20 (09/22 1600) BP: (93-119)/(57-77) 107/71 (09/22 1600) SpO2:  [90 %-100 %] 98 % (09/22 1600)  Psychiatric Specialty Exam:  Presentation  General Appearance: Appropriate for Environment (In hospital gown)  Eye Contact:Good  Speech:Clear and Coherent; Normal Rate  Speech Volume:Normal  Handedness:Right   Mood and Affect  Mood: Less depressed; less irritable  Affect:Congruent; more pleasant, appearing to near baseline   Thought Process  Thought Processes:Coherent; Linear  Descriptions of Associations:Intact  Orientation:Full (Time, Place and Person)  Thought Content:Logical; WDL  History of Schizophrenia/Schizoaffective disorder:Yes  Duration of Psychotic Symptoms:Greater than six months  Hallucinations: Command auditory hallucinations, no improvements in volume nor frequency  Ideas of  Reference:None  Suicidal Thoughts: Less suicidal thoughts, as his physical symptoms improve, but CAH remains  Homicidal Thoughts:Denies  Sensorium  Memory:Immediate Fair; Recent Fair  Judgment:Poor  Insight:Fair   Executive Functions  Concentration:Fair  Attention Span:Fair  Recall:Fair  Fund of Knowledge:Fair  Language:Fair   Psychomotor Activity  Psychomotor Activity: Normal  Assets  Assets:Communication Skills; Desire for Improvement; Financial Resources/Insurance   Sleep  Sleep: Fair   Physical Exam: Physical Exam Vitals reviewed.  Constitutional:      General: He is not in acute distress.    Appearance: He is ill-appearing. He is not diaphoretic.  Pulmonary:     Effort: No respiratory distress.     Comments: Nasal cannula in place Musculoskeletal:     Comments: R AKA  Neurological:     Mental Status: He is alert.     Blood pressure 107/71, pulse 83, temperature 98.1 F (36.7 C), temperature source Oral, resp. rate 20, height 5\' 9"  (1.753 m), weight 86.2 kg, SpO2 98 %. Body mass index is 28.06 kg/m.

## 2022-08-22 NOTE — Progress Notes (Signed)
PROGRESS NOTE        PATIENT DETAILS Name: [name] Age: 56 y.o. Sex: male Date of Birth: 1966/01/30 Admit Date: 08/16/2022 Admitting Physician Clance Boll, MD DGL:[id number], [name], NP  Brief Summary: Patient is a 56 y.o.  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  He was subsequently started on anticoagulation and admitted to the hospitalist service.  Further hospital course was complicated by patient having suicidal ideation-psychiatry was consulted-bedside sitter was placed-with recommendations to pursue inpatient psychiatric admission.  See below for further details.  Significant events: 5/4-5/12>> hospitalization for sepsis from cellulitis from chronic lower extremity wound. 8/12-8/25>> hospitalization for HFrEF exacerbation/hypoxia 9/16>> admit to [facility]-pleuritic chest pain/shortness of breath-CTA with bilateral PE and pulmonary infarct.  Significant studies: 9/16>> CTA chest: Bilateral PE-right lung infarct. 9/16>> bilateral lower extremity angiogram: No major stenosis. 9/17>> x-ray left ankle: No fracture/dislocation. 9/17>> UDS: Positive for opiates/cocaine 9/18>> lower extremity Doppler: No DVT 9/18>> Limited echo: PI<95%, RV systolic function moderately reduced. 9/20>> CXR: Slight worsening of peripheral density in the right mid/lower lung.  Significant microbiology data: None  Procedures: None  Consults: Psychiatry  Subjective: Continues to complain of pain in his RLE.  Home once his diet changed.  Objective: Vitals: Blood pressure 118/68, pulse 79, temperature 98.1 F (36.7 C), temperature source Oral, resp. rate 20, height 5\' 9"  (1.753  m), weight 86.2 kg, SpO2 100 %.   Exam: Gen Exam:Alert awake-not in any distress HEENT:atraumatic, normocephalic Chest: B/L clear to auscultation anteriorly CVS:S1S2 regular Abdomen:soft non tender, non distended Extremities: Right AKA-left great toe amputation. Neurology: Right hemiparesis-right facial droop Skin: no rash   Pertinent Labs/Radiology:    Latest Ref Rng & Units 08/18/2022    2:49 PM 08/16/2022    7:39 PM 08/16/2022    5:17 PM  CBC  WBC 4.0 - 10.5 K/uL 9.2     Hemoglobin 13.0 - 17.0 g/dL 12.0  12.2  8.5   Hematocrit 39.0 - 52.0 % 36.6  36.0  25.0   Platelets 150 - 400 K/uL 256       Lab Results  Component Value Date   NA 135 08/18/2022   K 4.7 08/18/2022   CL 103 08/18/2022   CO2 17 (L) 08/18/2022      Assessment/Plan: Pulmonary embolism with right lung infarct: Pleuritic pain somewhat better-remains on Eliquis.  Etiology due to noncompliance of anticoagulation.  Likely needs indefinite anticoagulation.    Chronic HFpEF/HFrEF: Euvolemic-continue Lasix/Coreg/lisinopril.  If he is compliant with this regimen-he could potentially be switched to Entresto at some point-we will need follow-up with cardiology in the outpatient setting.  Cough: Suspect from GERD/lung infarct-although at risk for aspiration-do not think he has pneumonia-he may have chemical pneumonitis-Augmentin discontinued on 9/21.  Suicidal ideation: Appreciate psych input-awaiting inpatient psychiatric bed.  History of bipolar disorder: Continue Abilify.  History of CVA with right hemiparesis/facial droop: On anticoagulation  Peripheral neuropathy: Continues to have intermittent mostly left lower extremity pain-continue scheduled Neurontin-and as needed oxycodone for breakthrough pain.  DM-2 (A1c 6.0 on 9/17): CBG stable with SSI.  Will liberalize his diet and see what happens to his sugars.  Follow-up with  Recent Labs    08/21/22 1649 08/21/22 2112 08/22/22 0758  GLUCAP 188* 161* 173*      History of right AKA-left great toe amputation  COPD: Not in exacerbation-start Brovana/Yupelri/budesonide- will convert to inhaler regimen when closer to discharge.  Polysubstance abuse: Counseled  Homelessness: Very vague about his situation-claims he sleeps wherever he can-claims he is on a wheelchair (right AKA/right hemiparesis).  Get PT/OT eval to see what his functionality is like.  BMI: Estimated body mass index is 28.06 kg/m as calculated from the following:   Height as of this encounter: 5\' 9"  (1.753 m).   Weight as of this encounter: 86.2 kg.   Code status:   Code Status: Full Code   DVT Prophylaxis: apixaban (ELIQUIS) tablet 10 mg  apixaban (ELIQUIS) tablet 5 mg     Family Communication: None at bedside   Disposition Plan: Status is: Inpatient Remains inpatient appropriate because: Suicidal ideation-sitter-awaiting inpatient psych bed.   Planned Discharge Destination: Inpatient psych bed-otherwise will likely require SNF.   Diet: Diet Order             Diet heart healthy/carb modified Room service appropriate? Yes; Fluid consistency: Thin  Diet effective now                     Antimicrobial agents: Anti-infectives (From admission, onward)    Start     Dose/Rate Route Frequency Ordered Stop   08/20/22 1345  amoxicillin-clavulanate (AUGMENTIN) 875-125 MG per tablet 1 tablet  Status:  Discontinued        1 tablet Oral Every 12 hours 08/20/22 1248 08/21/22 1007        MEDICATIONS: Scheduled Meds:  apixaban  10 mg Oral BID   Followed by   08/23/22 ON 08/24/2022] apixaban  5 mg Oral BID   arformoterol  15 mcg Nebulization BID   ARIPiprazole  10 mg Oral Daily   budesonide (PULMICORT) nebulizer solution  0.25 mg Nebulization BID   carvedilol  3.125 mg Oral BID WC   folic acid  1 mg Oral Daily   furosemide  40 mg Oral BID   gabapentin  400 mg Oral TID   insulin aspart  0-9 Units Subcutaneous TID WC   lisinopril  10 mg Oral Daily    revefenacin  175 mcg Nebulization Daily   thiamine  100 mg Oral Daily   Continuous Infusions: PRN Meds:.hydrALAZINE, oxyCODONE   I have personally reviewed following labs and imaging studies  LABORATORY DATA: CBC: Recent Labs  Lab 08/16/22 1542 08/16/22 1717 08/16/22 1939 08/18/22 1449  WBC 5.6  --   --  9.2  NEUTROABS 3.0  --   --  6.2  HGB 10.7* 8.5* 12.2* 12.0*  HCT 34.9* 25.0* 36.0* 36.6*  MCV 94.1  --   --  89.7  PLT 250  --   --  256     Basic Metabolic Panel: Recent Labs  Lab 08/16/22 1542 08/16/22 1717 08/16/22 1912 08/16/22 1939 08/17/22 0903 08/18/22 0741  NA 141 143 136 138 138 135  K 2.4* 2.4* 3.7 3.7 3.8 4.7  CL 117*  --  105  --  104 103  CO2 14*  --  21*  --  24 17*  GLUCOSE 63*  --  116*  --  102* 89  BUN 9  --  11  --  9 11  CREATININE 0.53*  --  0.92  --  1.02 0.86  CALCIUM 5.7*  --  8.5*  --  8.3* 8.0*  MG  --   --  1.8  --  1.8  --      GFR: Estimated Creatinine Clearance: 106.8 mL/min (by C-G formula based on SCr of 0.86 mg/dL).  Liver Function Tests: Recent Labs  Lab 08/16/22 1542 08/16/22 1912 08/17/22 0903  AST 10* 15 17  ALT 7 9 7   ALKPHOS 51 73 74  BILITOT 0.9 1.4* 1.0  PROT 4.5* 7.0 6.1*  ALBUMIN 2.0* 2.8* 2.7*    No results for input(s): "LIPASE", "AMYLASE" in the last 168 hours. No results for input(s): "AMMONIA" in the last 168 hours.  Coagulation Profile: No results for input(s): "INR", "PROTIME" in the last 168 hours.  Cardiac Enzymes: No results for input(s): "CKTOTAL", "CKMB", "CKMBINDEX", "TROPONINI" in the last 168 hours.  BNP (last 3 results) No results for input(s): "PROBNP" in the last 8760 hours.  Lipid Profile: No results for input(s): "CHOL", "HDL", "LDLCALC", "TRIG", "CHOLHDL", "LDLDIRECT" in the last 72 hours.  Thyroid Function Tests: No results for input(s): "TSH", "T4TOTAL", "FREET4", "T3FREE", "THYROIDAB" in the last 72 hours.  Anemia Panel: No results for input(s): "VITAMINB12",  "FOLATE", "FERRITIN", "TIBC", "IRON", "RETICCTPCT" in the last 72 hours.  Urine analysis:    Component Value Date/Time   COLORURINE YELLOW 08/17/2022 0625   APPEARANCEUR CLEAR 08/17/2022 0625   LABSPEC 1.021 08/17/2022 0625   PHURINE 5.0 08/17/2022 0625   GLUCOSEU NEGATIVE 08/17/2022 0625   HGBUR NEGATIVE 08/17/2022 0625   BILIRUBINUR NEGATIVE 08/17/2022 0625   KETONESUR NEGATIVE 08/17/2022 0625   PROTEINUR NEGATIVE 08/17/2022 0625   NITRITE NEGATIVE 08/17/2022 0625   LEUKOCYTESUR NEGATIVE 08/17/2022 0625    Sepsis Labs: Lactic Acid, Venous    Component Value Date/Time   LATICACIDVEN 0.9 07/13/2022 2058    MICROBIOLOGY: No results found for this or any previous visit (from the past 240 hour(s)).  RADIOLOGY STUDIES/RESULTS: No results found.   LOS: 5 days   2059, MD  [facility]    To contact the attending provider between 7A-7P or the covering provider during after hours 7P-7A, please log into the web site [email or web address] and access using universal [place] password for that web site. If you do not have the password, please call the hospital operator.  08/22/2022, 11:42 AM

## 2022-08-23 DIAGNOSIS — I5042 Chronic combined systolic (congestive) and diastolic (congestive) heart failure: Secondary | ICD-10-CM | POA: Diagnosis not present

## 2022-08-23 DIAGNOSIS — I2699 Other pulmonary embolism without acute cor pulmonale: Secondary | ICD-10-CM | POA: Diagnosis not present

## 2022-08-23 DIAGNOSIS — F1414 Cocaine abuse with cocaine-induced mood disorder: Secondary | ICD-10-CM | POA: Diagnosis not present

## 2022-08-23 DIAGNOSIS — F319 Bipolar disorder, unspecified: Secondary | ICD-10-CM | POA: Diagnosis not present

## 2022-08-23 LAB — GLUCOSE, CAPILLARY
Glucose-Capillary: 146 mg/dL — ABNORMAL HIGH (ref 70–99)
Glucose-Capillary: 129 mg/dL — ABNORMAL HIGH (ref 70–99)
Glucose-Capillary: 160 mg/dL — ABNORMAL HIGH (ref 70–99)
Glucose-Capillary: 164 mg/dL — ABNORMAL HIGH (ref 70–99)

## 2022-08-23 NOTE — Progress Notes (Addendum)
PROGRESS NOTE        PATIENT DETAILS Name: [name] Age: 56 y.o. Sex: male Date of Birth: 07/15/1966 Admit Date: 08/16/2022 Admitting Physician Lurline Del, MD DGU:[id number], Chales Abrahams, NP  Brief Summary: Patient is a 56 y.o.  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  He was subsequently started on anticoagulation and admitted to the hospitalist service.  Further hospital course was complicated by patient having suicidal ideation-psychiatry was consulted-bedside sitter was placed-with recommendations to pursue inpatient psychiatric admission.  See below for further details.  Significant events: 5/4-5/12>> hospitalization for sepsis from cellulitis from chronic lower extremity wound. 8/12-8/25>> hospitalization for HFrEF exacerbation/hypoxia 9/16>> admit to [facility]-pleuritic chest pain/shortness of breath-CTA with bilateral PE and pulmonary infarct.  Significant studies: 9/16>> CTA chest: Bilateral PE-right lung infarct. 9/16>> bilateral lower extremity angiogram: No major stenosis. 9/17>> x-ray left ankle: No fracture/dislocation. 9/17>> UDS: Positive for opiates/cocaine 9/18>> lower extremity Doppler: No DVT 9/18>> Limited echo: EF<20%, RV systolic function moderately reduced. 9/20>> CXR: Slight worsening of peripheral density in the right mid/lower lung.  Significant microbiology data: None  Procedures: None  Consults: Psychiatry  Subjective: Upset that he is on a heart healthy diet.  Wants more salt.  Objective: Vitals: Blood pressure 115/82, pulse 75, temperature 98.8 F (37.1 C), temperature source Oral, resp. rate 14, height 5\' 9"  (1.753 m), weight  86.2 kg, SpO2 97 %.   Exam: Gen Exam:Alert awake-not in any distress HEENT:atraumatic, normocephalic Chest: B/L clear to auscultation anteriorly CVS:S1S2 regular Abdomen:soft non tender, non distended Extremities: Right AKA-left great toe amputation. Neurology: Right hemiparesis. Skin: no rash   Pertinent Labs/Radiology:    Latest Ref Rng & Units 08/18/2022    2:49 PM 08/16/2022    7:39 PM 08/16/2022    5:17 PM  CBC  WBC 4.0 - 10.5 K/uL 9.2     Hemoglobin 13.0 - 17.0 g/dL 08/18/2022  42.5  8.5   Hematocrit 39.0 - 52.0 % 36.6  36.0  25.0   Platelets 150 - 400 K/uL 256       Lab Results  Component Value Date   NA 135 08/18/2022   K 4.7 08/18/2022   CL 103 08/18/2022   CO2 17 (L) 08/18/2022      Assessment/Plan: Pulmonary embolism with right lung infarct: Pleuritic pain improving-continue Eliquis.  Etiology of VTE likely due to noncompliance.  Given that he has had VTE in the past-needs indefinite anticoagulation.   Chronic HFpEF/HFrEF: Euvolemic-continue Lasix/Coreg/lisinopril.  If compliant with this regimen-could be switched to Entresto at some point.  Follow with cardiology in the outpatient setting.   Cough: Suspect from GERD/lung infarct-although at risk for aspiration-do not think he has pneumonia-he may have chemical pneumonitis-Augmentin discontinued on 9/21.  Suicidal ideation: Appreciate psych input-awaiting inpatient psychiatric bed.  History of bipolar disorder: Continue Abilify.  History of CVA with right hemiparesis/facial droop: On anticoagulation  Peripheral neuropathy: Continues to have intermittent mostly left lower extremity pain-continue scheduled Neurontin-and as needed oxycodone for breakthrough pain.  DM-2 (A1c 6.0 on 9/17): CBG stable with SSI.  Will liberalize his diet and see what happens to his sugars.  Follow-up with  Recent Labs    08/22/22 1311 08/22/22 1606 08/23/22 0845  GLUCAP 132* 125* 146*     History of right AKA-left great toe  amputation  COPD: Not in exacerbation-continue Brovana/Yupelri/budesonide- will convert to inhaler regimen when closer to discharge.  Polysubstance abuse-cocaine: Counseled  Homelessness: Very vague about his situation-claims he sleeps wherever he can-eats whatever he can get-claims he is on a wheelchair (right AKA/right hemiparesis).  Social worker following.  BMI: Estimated body mass index is 28.06 kg/m as calculated from the following:   Height as of this encounter: 5\' 9"  (1.753 m).   Weight as of this encounter: 86.2 kg.   Code status:   Code Status: Full Code   DVT Prophylaxis: apixaban (ELIQUIS) tablet 10 mg  apixaban (ELIQUIS) tablet 5 mg     Family Communication: None at bedside   Disposition Plan: Status is: Inpatient Remains inpatient appropriate because: Suicidal ideation-sitter-awaiting inpatient psych bed.   Planned Discharge Destination: Inpatient psych bed-otherwise will likely require SNF.   Diet: Diet Order             Diet Heart Room service appropriate? No; Fluid consistency: Thin  Diet effective now                     Antimicrobial agents: Anti-infectives (From admission, onward)    Start     Dose/Rate Route Frequency Ordered Stop   08/20/22 1345  amoxicillin-clavulanate (AUGMENTIN) 875-125 MG per tablet 1 tablet  Status:  Discontinued        1 tablet Oral Every 12 hours 08/20/22 1248 08/21/22 1007        MEDICATIONS: Scheduled Meds:  apixaban  10 mg Oral BID   Followed by   Derrill Memo ON 08/24/2022] apixaban  5 mg Oral BID   arformoterol  15 mcg Nebulization BID   ARIPiprazole  10 mg Oral Daily   budesonide (PULMICORT) nebulizer solution  0.25 mg Nebulization BID   carvedilol  3.125 mg Oral BID WC   folic acid  1 mg Oral Daily   furosemide  40 mg Oral BID   gabapentin  400 mg Oral TID   insulin aspart  0-9 Units Subcutaneous TID WC   lisinopril  10 mg Oral Daily   revefenacin  175 mcg Nebulization Daily   thiamine  100 mg Oral  Daily   Continuous Infusions: PRN Meds:.hydrALAZINE, oxyCODONE   I have personally reviewed following labs and imaging studies  LABORATORY DATA: CBC: Recent Labs  Lab 08/16/22 1542 08/16/22 1717 08/16/22 1939 08/18/22 1449  WBC 5.6  --   --  9.2  NEUTROABS 3.0  --   --  6.2  HGB 10.7* 8.5* 12.2* 12.0*  HCT 34.9* 25.0* 36.0* 36.6*  MCV 94.1  --   --  89.7  PLT 250  --   --  256     Basic Metabolic Panel: Recent Labs  Lab 08/16/22 1542 08/16/22 1717 08/16/22 1912 08/16/22 1939 08/17/22 0903 08/18/22 0741  NA 141 143 136 138 138 135  K 2.4* 2.4* 3.7 3.7 3.8 4.7  CL 117*  --  105  --  104 103  CO2 14*  --  21*  --  24 17*  GLUCOSE 63*  --  116*  --  102* 89  BUN 9  --  11  --  9 11  CREATININE 0.53*  --  0.92  --  1.02 0.86  CALCIUM 5.7*  --  8.5*  --  8.3* 8.0*  MG  --   --  1.8  --  1.8  --      GFR: Estimated Creatinine Clearance: 106.8 mL/min (by C-G formula based on SCr of 0.86 mg/dL).  Liver Function Tests: Recent Labs  Lab 08/16/22 1542 08/16/22 1912 08/17/22 0903  AST 10* 15 17  ALT 7 9 7   ALKPHOS 51 73 74  BILITOT 0.9 1.4* 1.0  PROT 4.5* 7.0 6.1*  ALBUMIN 2.0* 2.8* 2.7*    No results for input(s): "LIPASE", "AMYLASE" in the last 168 hours. No results for input(s): "AMMONIA" in the last 168 hours.  Coagulation Profile: No results for input(s): "INR", "PROTIME" in the last 168 hours.  Cardiac Enzymes: No results for input(s): "CKTOTAL", "CKMB", "CKMBINDEX", "TROPONINI" in the last 168 hours.  BNP (last 3 results) No results for input(s): "PROBNP" in the last 8760 hours.  Lipid Profile: No results for input(s): "CHOL", "HDL", "LDLCALC", "TRIG", "CHOLHDL", "LDLDIRECT" in the last 72 hours.  Thyroid Function Tests: No results for input(s): "TSH", "T4TOTAL", "FREET4", "T3FREE", "THYROIDAB" in the last 72 hours.  Anemia Panel: No results for input(s): "VITAMINB12", "FOLATE", "FERRITIN", "TIBC", "IRON", "RETICCTPCT" in the last 72  hours.  Urine analysis:    Component Value Date/Time   COLORURINE YELLOW 08/17/2022 0625   APPEARANCEUR CLEAR 08/17/2022 0625   LABSPEC 1.021 08/17/2022 0625   PHURINE 5.0 08/17/2022 0625   GLUCOSEU NEGATIVE 08/17/2022 0625   HGBUR NEGATIVE 08/17/2022 0625   BILIRUBINUR NEGATIVE 08/17/2022 0625   KETONESUR NEGATIVE 08/17/2022 0625   PROTEINUR NEGATIVE 08/17/2022 0625   NITRITE NEGATIVE 08/17/2022 0625   LEUKOCYTESUR NEGATIVE 08/17/2022 0625    Sepsis Labs: Lactic Acid, Venous    Component Value Date/Time   LATICACIDVEN 0.9 07/13/2022 2058    MICROBIOLOGY: No results found for this or any previous visit (from the past 240 hour(s)).  RADIOLOGY STUDIES/RESULTS: No results found.   LOS: 6 days   2059, MD  [facility]    To contact the attending provider between 7A-7P or the covering provider during after hours 7P-7A, please log into the web site [email or web address] and access using universal [place] password for that web site. If you do not have the password, please call the hospital operator.  08/23/2022, 11:10 AM

## 2022-08-24 DIAGNOSIS — F1414 Cocaine abuse with cocaine-induced mood disorder: Secondary | ICD-10-CM | POA: Diagnosis not present

## 2022-08-24 DIAGNOSIS — I2699 Other pulmonary embolism without acute cor pulmonale: Secondary | ICD-10-CM | POA: Diagnosis not present

## 2022-08-24 LAB — BASIC METABOLIC PANEL
Anion gap: 14 (ref 5–15)
BUN: 15 mg/dL (ref 6–20)
CO2: 23 mmol/L (ref 22–32)
Calcium: 9.2 mg/dL (ref 8.9–10.3)
Chloride: 101 mmol/L (ref 98–111)
Creatinine, Ser: 0.86 mg/dL (ref 0.61–1.24)
GFR, Estimated: >60 mL/min (ref >60)
Glucose, Bld: 97 mg/dL (ref 70–99)
Potassium: 4.3 mmol/L (ref 3.5–5.1)
Sodium: 138 mmol/L (ref 135–145)

## 2022-08-24 LAB — GLUCOSE, CAPILLARY
Glucose-Capillary: 148 mg/dL — ABNORMAL HIGH (ref 70–99)
Glucose-Capillary: 106 mg/dL — ABNORMAL HIGH (ref 70–99)
Glucose-Capillary: 122 mg/dL — ABNORMAL HIGH (ref 70–99)
Glucose-Capillary: 98 mg/dL (ref 70–99)

## 2022-08-24 LAB — MAGNESIUM: Magnesium: 2 mg/dL (ref 1.7–2.4)

## 2022-08-24 LAB — CBC
HCT: 38.7 % — ABNORMAL LOW (ref 39.0–52.0)
Hemoglobin: 12.7 g/dL — ABNORMAL LOW (ref 13.0–17.0)
MCH: 28.9 pg (ref 26.0–34.0)
MCHC: 32.8 g/dL (ref 30.0–36.0)
MCV: 88 fL (ref 80.0–100.0)
Platelets: 386 10*3/uL (ref 150–400)
RBC: 4.4 MIL/uL (ref 4.22–5.81)
RDW: 17.2 % — ABNORMAL HIGH (ref 11.5–15.5)
WBC: 5.5 10*3/uL (ref 4.0–10.5)
nRBC: 0 % (ref 0.0–0.2)

## 2022-08-24 NOTE — Progress Notes (Addendum)
Pt refused to let RN do assessment stated "I am fine, and don't need you to assess me"

## 2022-08-24 NOTE — Progress Notes (Signed)
PROGRESS NOTE        PATIENT DETAILS Name: [name] Age: 56 y.o. Sex: male Date of Birth: 03-11-1966 Admit Date: 08/16/2022 Admitting Physician Clance Boll, MD [id number], [name], NP  Brief Summary: Patient is a 56 y.o.  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  He was subsequently started on anticoagulation and admitted to the hospitalist service.  Further hospital course was complicated by patient having suicidal ideation-psychiatry was consulted-bedside sitter was placed-with recommendations to pursue inpatient psychiatric admission.  See below for further details.  Significant events: 5/4-5/12>> hospitalization for sepsis from cellulitis from chronic lower extremity wound. 8/12-8/25>> hospitalization for HFrEF exacerbation/hypoxia 9/16>> admit to [facility]-pleuritic chest pain/shortness of breath-CTA with bilateral PE and pulmonary infarct.  Significant studies: 9/16>> CTA chest: Bilateral PE-right lung infarct. 9/16>> bilateral lower extremity angiogram: No major stenosis. 9/17>> x-ray left ankle: No fracture/dislocation. 9/17>> UDS: Positive for opiates/cocaine 9/18>> lower extremity Doppler: No DVT 9/18>> Limited echo: IH<47%, RV systolic function moderately reduced. 9/20>> CXR: Slight worsening of peripheral density in the right mid/lower lung.  Significant microbiology data: None  Procedures: None  Consults: Psychiatry  Subjective:  Patient in bed, appears comfortable, denies any headache, no fever, no chest pain or pressure, no shortness of breath , no abdominal pain. No new focal weakness.   Objective: Vitals: Blood pressure 135/82, pulse  76, temperature 98.3 F (36.8 C), temperature source Oral, resp. rate 14, height 5\' 9"  (1.753 m), weight 86.2 kg, SpO2 97 %.   Exam:  Awake Alert, No new F.N deficits, Normal affect [place].AT,PERRAL Supple Neck, No JVD,   Symmetrical Chest wall movement, Good air movement bilaterally, CTAB RRR,No Gallops, Rubs or new Murmurs,  +ve B.Sounds, Abd Soft, No tenderness,    Right AKA-left great toe amputation.    Assessment/Plan:  Pulmonary embolism with right lung infarct: Pleuritic pain improving-continue Eliquis.  Etiology of VTE likely due to noncompliance.  Given that he has had VTE in the past-needs indefinite anticoagulation.   Chronic HFpEF/HFrEF: Euvolemic-continue Lasix/Coreg/lisinopril.  If compliant with this regimen-could be switched to Entresto at some point.  Follow with cardiology in the outpatient setting.   Cough: Suspect from GERD/lung infarct-although at risk for aspiration-do not think he has pneumonia-he may have chemical pneumonitis-Augmentin discontinued on 9/21.  Suicidal ideation: Appreciate psych input-awaiting inpatient psychiatric bed.  History of bipolar disorder: Continue Abilify.  History of CVA with right hemiparesis/facial droop: On anticoagulation  Peripheral neuropathy: Continues to have intermittent mostly left lower extremity pain-continue scheduled Neurontin-and as needed oxycodone for breakthrough pain.  DM-2 (A1c 6.0 on 9/17): CBG stable with SSI.  Will liberalize his diet and see what happens to his sugars.  Follow-up with  Recent Labs    08/23/22 1717 08/23/22 2103 08/24/22 0735  GLUCAP 160* 164* 148*    History of right AKA-left great toe amputation  COPD: Not in exacerbation-continue Brovana/Yupelri/budesonide- will convert to inhaler regimen when closer to discharge.  Polysubstance abuse-cocaine: Counseled  Homelessness: Very vague about his situation-claims he sleeps wherever he can-eats whatever he can get-claims he is on a wheelchair  (right AKA/right hemiparesis).  Social worker following.  BMI: Estimated  body mass index is 28.06 kg/m as calculated from the following:   Height as of this encounter: 5\' 9"  (1.753 m).   Weight as of this encounter: 86.2 kg.   Code status:   Code Status: Full Code   DVT Prophylaxis: apixaban (ELIQUIS) tablet 5 mg     Family Communication: None at bedside   Disposition Plan: Status is: Inpatient Remains inpatient appropriate because: Suicidal ideation-sitter-awaiting inpatient psych bed.   Planned Discharge Destination: Inpatient psych bed-otherwise will likely require SNF.   Diet: Diet Order             Diet Heart Room service appropriate? No; Fluid consistency: Thin  Diet effective now                    MEDICATIONS: Scheduled Meds:  apixaban  5 mg Oral BID   arformoterol  15 mcg Nebulization BID   ARIPiprazole  10 mg Oral Daily   budesonide (PULMICORT) nebulizer solution  0.25 mg Nebulization BID   carvedilol  3.125 mg Oral BID WC   folic acid  1 mg Oral Daily   furosemide  40 mg Oral BID   gabapentin  400 mg Oral TID   insulin aspart  0-9 Units Subcutaneous TID WC   lisinopril  10 mg Oral Daily   revefenacin  175 mcg Nebulization Daily   thiamine  100 mg Oral Daily   Continuous Infusions: PRN Meds:.hydrALAZINE, oxyCODONE   I have personally reviewed following labs and imaging studies  LABORATORY DATA:  Recent Labs  Lab 08/18/22 1449 08/24/22 0620  WBC 9.2 5.5  HGB 12.0* 12.7*  HCT 36.6* 38.7*  PLT 256 386  MCV 89.7 88.0  MCH 29.4 28.9  MCHC 32.8 32.8  RDW 17.9* 17.2*  LYMPHSABS 2.0  --   MONOABS 1.0  --   EOSABS 0.1  --   BASOSABS 0.0  --     Recent Labs  Lab 08/18/22 0741 08/20/22 1032 08/24/22 0620  NA 135  --  138  K 4.7  --  4.3  CL 103  --  101  CO2 17*  --  23  GLUCOSE 89  --  97  BUN 11  --  15  CREATININE 0.86  --  0.86  CALCIUM 8.0*  --  9.2  MG  --   --  2.0  PROCALCITON  --  <0.10  --          RADIOLOGY  STUDIES/RESULTS: No results found.   LOS: 7 days   Signature  08/26/22 M.D on 08/24/2022 at 9:17 AM   -  To page go to [email or web address]

## 2022-08-24 NOTE — Consult Note (Signed)
[facility] Psychiatry Followup Face-to-Face Psychiatric Evaluation   Name: [name] DOB: 1966/01/01 MRN: [id number] Service Date: August 24, 2022 LOS:  LOS: 7 days  Reason for Consult: "Suicidal ideation" Referring Provider: Hughie Closs, MD   Assessment  [name] is a 56 y.o. male admitted medically for 08/16/2022  3:03 PM for chest pain, dyspnea, and suicidal ideation for the past 2 weeks. He carries the psychiatric diagnoses of schizoaffective disorder bipolar type and has a past medical history of T2DM, DVT, chronic combined diastolic systolic heart failure, right AKA, and normocytic anemia.   His current presentation is most consistent with suicidal ideation in the depressive episode of schizoaffective bipolar type, as a sequelae of current physical symptomology. He continues to meet criteria for inpatient psychiatric admission based on the fact that he is not psychotic and actively endorsing SI with a plan and intent. However, historically, patient has a history of malingering, and currently has a sense of impending doom from his PE that seems to be the primary driver in his desire to end his life, which patient states on assessment today. Current outpatient psychotropic medications include Abilify 5 mg and historically he has had a good response to this medication. His home medication has since been increased to 10 mg, with plan to continue to titrate as needed. He was not compliant with medications prior to admission as evidenced by patient initial reporting. He has been medication compliant thus far this admission. Please see plan below for detailed recommendations.   9/24: mild improvement noted on 2 consecutive assessments.   Diagnoses:  Active Hospital problems: Principal Problem:   Pulmonary emboli (HCC) Active Problems:   Cocaine use disorder, severe, dependence (HCC)   Cocaine abuse with cocaine-induced mood disorder (HCC)   Homeless   Suicidal ideation    Essential hypertension   Bipolar I disorder (HCC)   Chronic combined systolic and diastolic congestive heart failure (HCC)     Plan  ## Safety and Observation Level:  - Based on my clinical evaluation, I estimate the patient to be at moderate risk of self harm in the current setting - At this time, we recommend continuing 1:1 level of observation. This decision is based on my review of the chart including patient's history and current presentation, interview of the patient, mental status examination, and consideration of suicide risk including evaluating suicidal ideation, plan, intent, suicidal or self-harm behaviors, risk factors, and protective factors. This judgment is based on our ability to directly address suicide risk, implement suicide prevention strategies and develop a safety plan while the patient is in the clinical setting. Please contact our team if there is a concern that risk level has changed.   ## Medications:  -- Continue Abilify 10 mg daily   ## Medical Decision Making Capacity:  Formal decision making capacity was not assessed for any particular decision as part of routine psychiatric evaluation.   ## Further Work-up:  -Recommend maintaining electrolytes, K+ greater than 4 and Mg greater than 2   -- most recent EKG on 9/19 had QtC of 497; corrected QTc Bazett 413 -- Pertinent labwork reviewed earlier this admission includes:  UDS positive cocaine and opiates Calcium 8.0, but albumin 2.7 Potassium 2.4 on admission, 4.7 today TSH 1.974 A1c 6.0% Magnesium 1.8   ## Disposition:  -- At this time, would continue to recommend inpatient psychiatric admission; appreciate [name] assistance in placement.  ## Behavioral / Environmental:  -- 1:1 sitter -Fall precautions, per primary team  ##Legal Status  Patient currently voluntary  Thank you for this consult request. Recommendations have been communicated to the primary team.  We will continue to follow at this time.    [place] A [name]   Follow-Up history  Relevant Aspects of Hospital Course:  Admitted on 08/16/2022 for left lower extremity swelling and dyspnea, found to have PE.  NAEON, and no behavioral PRNs required.  Patient Report:  Had brief pleasant conversation with pt at start of interview. Pt reported feeling a little better, stating "those pills are starting to work", however, states if he is discharged he will kill himself. Unable to name anything that has improved.   Makes several accusations that no one is trying to help him. States he is waiting to be discharged, so he can wheel himself into traffic. Reminded him that if he improves psychiatrically, the plan is to look for assisted living rather than have him return to the street. States that he has been told that before. Denies HI. When asked about AH/VH (as well as several other questions), states "you tell me".    Collateral information:  Per sitter at bedside: much more pleasant than when first came in, although quite irritable when he does not get his way.   Psychiatric History:  Information collected from patient, chart  Per Patient and old records-Depression, Schizoaffective disorder,hx of bipolar, schizophrenia and cocaine abuse. Pt reports several hospitalizations , more than he is able to count and has had several suicide attempts .   Family psych history: reported mom committed suicide and died but did not say what she did.   Social History:  Homeless, ambulates with walker and wheelchair  Tobacco use: Denies intermittent use, but would not detail amount Alcohol use: Denies current use Drug use: Cocaine use, with last use the night before ED presentation  Family History:   The patient's family history includes Drug abuse in his mother; Suicidality in his mother.  Medical History: Past Medical History:  Diagnosis Date   Asthma    Bipolar 1 disorder ([place])    CHF (congestive heart failure) (HCC)    COPD  (chronic obstructive pulmonary disease) ([place])    Depression    Diabetes mellitus without complication (HCC)    GERD (gastroesophageal reflux disease)    Homelessness    Hypertension    QT prolongation    Schizoaffective disorder [facility])     Surgical History: Past Surgical History:  Procedure Laterality Date   AMPUTATION TOE Bilateral 01/02/2021   Procedure: AMPUTATION TOE;  Surgeon: Evelina Bucy, DPM;  Location: [place];  Service: Podiatry;  Laterality: Bilateral;  Ok to leave patient on hospital bed   IR ANGIO INTRA EXTRACRAN SEL COM CAROTID INNOMINATE BILAT MOD SED  12/17/2020   IR ANGIO VERTEBRAL SEL VERTEBRAL BILAT MOD SED  12/17/2020   RADIOLOGY WITH ANESTHESIA N/A 12/17/2020   Procedure: IR WITH ANESTHESIA;  Surgeon: Radiologist, Medication, MD;  Location: [place];  Service: Radiology;  Laterality: N/A;    Medications:   Current Facility-Administered Medications:    [COMPLETED] apixaban (ELIQUIS) tablet 10 mg, 10 mg, Oral, BID, 10 mg at 08/23/22 2058 **FOLLOWED BY** apixaban (ELIQUIS) tablet 5 mg, 5 mg, Oral, BID, Bell, Lorin C, RPH, 5 mg at 08/24/22 0838   arformoterol (BROVANA) nebulizer solution 15 mcg, 15 mcg, Nebulization, BID, Ghimire, Henreitta Leber, MD, 15 mcg at 08/23/22 2015   ARIPiprazole (ABILIFY) tablet 10 mg, 10 mg, Oral, Daily, Cosby, Courtney, MD, 10 mg at 08/24/22 1230   budesonide (PULMICORT) nebulizer  solution 0.25 mg, 0.25 mg, Nebulization, BID, Ghimire, Shanker M, MD, 0.25 mg at 08/23/22 2011   carvedilol (COREG) tablet 3.125 mg, 3.125 mg, Oral, BID WC, Skip Mayer A, MD, 3.125 mg at 08/24/22 0093   folic acid (FOLVITE) tablet 1 mg, 1 mg, Oral, Daily, Skip Mayer A, MD, 1 mg at 08/24/22 8182   furosemide (LASIX) tablet 40 mg, 40 mg, Oral, BID, Skip Mayer A, MD, 40 mg at 08/24/22 9937   gabapentin (NEURONTIN) capsule 400 mg, 400 mg, Oral, TID, Skip Mayer A, MD, 400 mg at 08/24/22 0839   hydrALAZINE (APRESOLINE) tablet 25 mg, 25 mg, Oral, Q6H  PRN, Pahwani, Ravi, MD   insulin aspart (novoLOG) injection 0-9 Units, 0-9 Units, Subcutaneous, TID WC, Lurline Del, MD, 1 Units at 08/24/22 0835   lisinopril (ZESTRIL) tablet 10 mg, 10 mg, Oral, Daily, Pahwani, Ravi, MD, 10 mg at 08/24/22 1696   oxyCODONE (Oxy IR/ROXICODONE) immediate release tablet 5 mg, 5 mg, Oral, Q6H PRN, Maretta Bees, MD, 5 mg at 08/24/22 0618   revefenacin (YUPELRI) nebulizer solution 175 mcg, 175 mcg, Nebulization, Daily, Ghimire, Werner Lean, MD, 175 mcg at 08/23/22 7893   thiamine (VITAMIN B1) tablet 100 mg, 100 mg, Oral, Daily, Skip Mayer A, MD, 100 mg at 08/24/22 8101  Allergies: Allergies  Allergen Reactions   Motrin [Ibuprofen] Anaphylaxis   Tylenol [Acetaminophen] Hives, Nausea And Vomiting and Rash   Lyrica Cr [Pregabalin Er] Other (See Comments)    Dystonia Dysarthria    Peanut-Containing Drug Products Other (See Comments)    Pt denies allergy to peanut butter 03/02/2021       Objective  Vital signs:  Temp:  [97.5 F (36.4 C)-98.4 F (36.9 C)] 97.5 F (36.4 C) (09/24 1117) Pulse Rate:  [71-81] 76 (09/24 0338) Resp:  [14-20] 14 (09/24 0338) BP: (101-135)/(61-84) 101/61 (09/24 1117) SpO2:  [97 %-99 %] 97 % (09/23 2011)  Psychiatric Specialty Exam:  Presentation  General Appearance: Appropriate for Environment  Eye Contact:Good  Speech:Clear and Coherent; Normal Rate  Speech Volume:Normal  Handedness:Right   Mood and Affect  Mood: Less depressed; less irritable  Affect:Congruent; more pleasant, appearing to near baseline   Thought Process  Thought Processes:Coherent; Linear  Descriptions of Associations:Intact  Orientation:Full (Time, Place and Person)  Thought Content:-- (mild paranoia to healthcare team)  History of Schizophrenia/Schizoaffective disorder:Yes  Duration of Psychotic Symptoms:Greater than six months  Hallucinations: Command auditory hallucinations, no improvements in volume nor  frequency  Ideas of Reference:None  Suicidal Thoughts: Less suicidal thoughts, as his physical symptoms improve, but CAH remains  Homicidal Thoughts:Denies  Sensorium  Memory:Immediate Fair; Recent Fair  Judgment:Poor  Insight:Fair   Executive Functions  Concentration:Fair  Attention Span:Fair  Recall:Fair  Fund of Knowledge:Fair  Language:Fair   Psychomotor Activity  Psychomotor Activity: Normal  Assets  Assets:Communication Skills; Desire for Improvement; Financial Resources/Insurance   Sleep  Sleep: Fair   Physical Exam: Physical Exam Vitals reviewed.  Constitutional:      General: He is not in acute distress.    Appearance: He is ill-appearing. He is not diaphoretic.  Pulmonary:     Effort: No respiratory distress.  Musculoskeletal:     Comments: R AKA  Neurological:     Mental Status: He is alert.     Blood pressure 101/61, pulse 76, temperature (!) 97.5 F (36.4 C), temperature source Oral, resp. rate 14, height 5\' 9"  (1.753 m), weight 86.2 kg, SpO2 97 %. Body mass index is 28.06 kg/m.

## 2022-08-25 DIAGNOSIS — I2699 Other pulmonary embolism without acute cor pulmonale: Secondary | ICD-10-CM | POA: Diagnosis not present

## 2022-08-25 DIAGNOSIS — F1414 Cocaine abuse with cocaine-induced mood disorder: Secondary | ICD-10-CM | POA: Diagnosis not present

## 2022-08-25 LAB — GLUCOSE, CAPILLARY
Glucose-Capillary: 93 mg/dL (ref 70–99)
Glucose-Capillary: 114 mg/dL — ABNORMAL HIGH (ref 70–99)
Glucose-Capillary: 125 mg/dL — ABNORMAL HIGH (ref 70–99)

## 2022-08-25 MED ORDER — ESCITALOPRAM OXALATE 10 MG PO TABS
5.0000 mg | ORAL_TABLET | Freq: Every day | ORAL | Status: DC
  Administered 2022-08-25 – 2022-10-09 (×46): 5 mg via ORAL
  Filled 2022-08-25 (×46): qty 1

## 2022-08-25 NOTE — Progress Notes (Signed)
Physical Therapy Treatment Patient Details Name: [name] MRN: [id number] DOB: 09-Feb-1966 Today's Date: 08/25/2022   History of Present Illness Pt is a 56yo M who presented 9/16 with c/o chest pain, SOB, and leg swelling. Found to have B PE. PMH includes asthma, bioplar 1, depression, schizoaffective disorder, CVA, chronic CHF, homelessness, HTN, DM2, cocaine and cannabis use and R AKA.    PT Comments    Focus of PT session functional mobility and activity tolerance progression. Pt tolerated x2 squat pivot transfers to/from w/c, requires min cuing for safety during transfer but does safely lock/unlock w/c prior to mobility. Pt able to self-propel w/c in hallway with rest breaks as needed, pt perseverative on getting a power chair and states he cannot keep using this w/c given the front L wheel is missing a spacer. PT to continue to follow.      Recommendations for follow up therapy are one component of a multi-disciplinary discharge planning process, led by the attending physician.  Recommendations may be updated based on patient status, additional functional criteria and insurance authorization.  Follow Up Recommendations  Skilled nursing-short term rehab (<3 hours/day) Can patient physically be transported by private vehicle: Yes   Assistance Recommended at Discharge Intermittent Supervision/Assistance  Patient can return home with the following A lot of help with bathing/dressing/bathroom;Assist for transportation;Help with stairs or ramp for entrance   Equipment Recommendations  Wheelchair (measurements PT);Wheelchair cushion (measurements PT) (power w/c)    Recommendations for Other Services       Precautions / Restrictions Precautions Precautions: Fall Restrictions Weight Bearing Restrictions: No     Mobility  Bed Mobility Overal bed mobility: Needs Assistance Bed Mobility: Supine to Sit, Sit to Supine     Supine to sit: Supervision Sit to supine: Supervision         Transfers Overall transfer level: Needs assistance Equipment used: None Transfers: Bed to chair/wheelchair/BSC       Squat pivot transfers: Supervision     General transfer comment: supervision for squat pivot to/from w/c for safety, increased time to perform    Ambulation/Gait               General Gait Details: nt   Psychologist, counselling mobility: Yes Wheelchair propulsion: Both upper extremities Wheelchair parts: Supervision/cueing Distance: 200 Wheelchair Assistance Details (indicate cue type and reason): cues for proper turning technique, not followed. x2 rest breaks  Modified Rankin (Stroke Patients Only)       Balance Overall balance assessment: Needs assistance Sitting-balance support: No upper extremity supported Sitting balance-Leahy Scale: Good     Standing balance support: During functional activity, Bilateral upper extremity supported Standing balance-Leahy Scale: Poor Standing balance comment: UE assist would be crucial                            Cognition Arousal/Alertness: Awake/alert Behavior During Therapy: WFL for tasks assessed/performed Overall Cognitive Status: No family/caregiver present to determine baseline cognitive functioning                                          Exercises      General Comments General comments (skin integrity, edema, etc.): on RA, no DOE      Pertinent Vitals/Pain Pain Assessment Pain Assessment: Faces Faces  Pain Scale: Hurts little more Pain Location: L foot/ankle Pain Descriptors / Indicators: Aching, Constant, Sore Pain Intervention(s): Limited activity within patient's tolerance, Monitored during session, Repositioned    Home Living                          Prior Function            PT Goals (current goals can now be found in the care plan section) Acute Rehab PT Goals Patient Stated  Goal: get a power chair PT Goal Formulation: With patient Time For Goal Achievement: 09/04/22 Potential to Achieve Goals: Good Progress towards PT goals: Progressing toward goals    Frequency    Min 3X/week      PT Plan Current plan remains appropriate    Co-evaluation              AM-PAC PT "6 Clicks" Mobility   Outcome Measure  Help needed turning from your back to your side while in a flat bed without using bedrails?: None Help needed moving from lying on your back to sitting on the side of a flat bed without using bedrails?: A Little Help needed moving to and from a bed to a chair (including a wheelchair)?: A Little Help needed standing up from a chair using your arms (e.g., wheelchair or bedside chair)?: A Little Help needed to walk in hospital room?: Total Help needed climbing 3-5 steps with a railing? : Total 6 Click Score: 15    End of Session   Activity Tolerance: Patient tolerated treatment well Patient left: in bed;with call bell/phone within reach;with nursing/sitter in room (sitter at bedside) Nurse Communication: Mobility status PT Visit Diagnosis: Other abnormalities of gait and mobility (R26.89)     Time: 1610-9604 PT Time Calculation (min) (ACUTE ONLY): 22 min  Charges:  $Therapeutic Activity: 8-22 mins                     Stacie Glaze, PT DPT Acute Rehabilitation Services Pager [phone]  Office [phone]    Roxine Caddy E Ruffin Pyo 08/25/2022, 5:07 PM

## 2022-08-25 NOTE — Consult Note (Addendum)
[facility] Psychiatry Followup Face-to-Face Psychiatric Evaluation   Name: [name] DOB: 1966-03-15 MRN: [id number] Service Date: August 25, 2022 LOS:  LOS: 8 days  Reason for Consult: "Suicidal ideation" Referring Provider: Hughie Closs, MD   Assessment  Devondre Guzzetta is a 56 y.o. male admitted medically for 08/16/2022  3:03 PM for chest pain, dyspnea, and suicidal ideation for the past 2 weeks. He carries the psychiatric diagnoses of schizoaffective disorder bipolar type and has a past medical history of T2DM, DVT, chronic combined diastolic systolic heart failure, right AKA, and normocytic anemia.   His current presentation is most consistent with suicidal ideation in the depressive episode of schizoaffective bipolar type, as a sequelae of current physical symptomology. He continues to meet criteria for inpatient psychiatric admission based on the fact that he is not psychotic and actively endorsing SI with a plan and intent. However, historically, patient has a history of malingering, and currently has a sense of impending doom from his PE that seems to be the primary driver in his desire to end his life, which patient states on assessment today. Current outpatient psychotropic medications include Abilify 5 mg and historically he has had a good response to this medication. His home medication has since been increased to 10 mg, with plan to continue to titrate as needed. He was not compliant with medications prior to admission as evidenced by patient initial reporting. He has been medication compliant thus far this admission. Please see plan below for detailed recommendations.   9/24: mild improvement noted on 2 consecutive assessments.   9/25: pt with no SI, HI, AH/VH although ongoing feelings of depression; had frank discussion and will be rescinding rec for inpt psyc; psych will continue to follow  While future psychiatric events cannot be accurately predicted, the patient  does not currently require acute inpatient psychiatric care and does not currently meet [facility] involuntary commitment criteria.   Diagnoses:  Active Hospital problems: Principal Problem:   Pulmonary emboli (HCC) Active Problems:   Cocaine use disorder, severe, dependence (HCC)   Cocaine abuse with cocaine-induced mood disorder (HCC)   Homeless   Suicidal ideation   Essential hypertension   Bipolar I disorder (HCC)   Chronic combined systolic and diastolic congestive heart failure (HCC)     Plan  ## Safety and Observation Level:  - Based on my clinical evaluation, I estimate the patient to be at low risk of self harm in the current setting; dc 1:1   ## Medications:  -- Continue Abilify 10 mg daily  -- s lexapro 5 daily   ## Medical Decision Making Capacity:  Formal decision making capacity was not assessed for any particular decision as part of routine psychiatric evaluation.   ## Further Work-up:  -Recommend maintaining electrolytes, K+ greater than 4 and Mg greater than 2   -- most recent EKG on 9/19 had QtC of 497; corrected QTc Bazett 413 -- Pertinent labwork reviewed earlier this admission includes:  UDS positive cocaine and opiates Calcium 8.0, but albumin 2.7 Potassium 2.4 on admission, 4.7 today TSH 1.974 A1c 6.0% Magnesium 1.8   ## Disposition:  -- At this time, would continue to recommend inpatient psychiatric admission; appreciate [name] assistance in placement.  ## Behavioral / Environmental:  -- 1:1 sitter -Fall precautions, per primary team  ##Legal Status Patient currently voluntary  Thank you for this consult request. Recommendations have been communicated to the primary team.  We will continue to follow at this time.   Claris Che  A Ada [place]   Follow-Up history  Relevant Aspects of Hospital Course:  Admitted on 08/16/2022 for left lower extremity swelling and dyspnea, found to have PE.  NAEON, and no behavioral PRNs  required.  Patient Report:  Had longer pleasant conversation with pt. Today he is feeling "so so", some good and some bad. Denying SI, HI and AH/VH - last yesterday. Some residual depressive sx, asks after antidepressant - explained that happy to start now that cocaine psychosis has resolved.  Expresses some frustration with different recommendations from different providers and gratitude for consistent messaging this admission from psychiatry service. Had long, frank discussion about both his physical and mental health - he is aware that practically he is going to stay in the hospital for some time as either a nursing or psych bed is searched for - in the past psychiatry has signed off when recommendation for inpt psychiatry was rescinded and he has felt abandoned. Assured him that psych team will not be signing off in either case, but that search for inpt psych given improvement over last few days will likely delay him from getting the help he needs (and he does not meet criteria at this time) and informed we would rescind this recommendation. He expressed gratitude that psych team will continue to see him and understanding.     Collateral information:  Per sitter at bedside: much more pleasant than when first came in, although quite irritable when he does not get his way.   Psychiatric History:  Information collected from patient, chart  Per Patient and old records-Depression, Schizoaffective disorder,hx of bipolar, schizophrenia and cocaine abuse. Pt reports several hospitalizations , more than he is able to count and has had several suicide attempts .   Family psych history: reported mom committed suicide and died but did not say what she did.   Social History:  Homeless, ambulates with walker and wheelchair  Tobacco use: Denies intermittent use, but would not detail amount Alcohol use: Denies current use Drug use: Cocaine use, with last use the night before ED presentation  Family  History:   The patient's family history includes Drug abuse in his mother; Suicidality in his mother.  Medical History: Past Medical History:  Diagnosis Date   Asthma    Bipolar 1 disorder (HCC)    CHF (congestive heart failure) (HCC)    COPD (chronic obstructive pulmonary disease) (HCC)    Depression    Diabetes mellitus without complication (HCC)    GERD (gastroesophageal reflux disease)    Homelessness    Hypertension    QT prolongation    Schizoaffective disorder [facility])     Surgical History: Past Surgical History:  Procedure Laterality Date   AMPUTATION TOE Bilateral 01/02/2021   Procedure: AMPUTATION TOE;  Surgeon: Park Liter, DPM;  Location: [facility] OR;  Service: Podiatry;  Laterality: Bilateral;  Ok to leave patient on hospital bed   IR ANGIO INTRA EXTRACRAN SEL COM CAROTID INNOMINATE BILAT MOD SED  12/17/2020   IR ANGIO VERTEBRAL SEL VERTEBRAL BILAT MOD SED  12/17/2020   RADIOLOGY WITH ANESTHESIA N/A 12/17/2020   Procedure: IR WITH ANESTHESIA;  Surgeon: Radiologist, Medication, MD;  Location: [facility] OR;  Service: Radiology;  Laterality: N/A;    Medications:   Current Facility-Administered Medications:    [COMPLETED] apixaban (ELIQUIS) tablet 10 mg, 10 mg, Oral, BID, 10 mg at 08/23/22 2058 **FOLLOWED BY** apixaban (ELIQUIS) tablet 5 mg, 5 mg, Oral, BID, Bell, Lorin C, RPH, 5 mg at 08/25/22 7371  arformoterol (BROVANA) nebulizer solution 15 mcg, 15 mcg, Nebulization, BID, Ghimire, Werner Lean, MD, 15 mcg at 08/25/22 8127   ARIPiprazole (ABILIFY) tablet 10 mg, 10 mg, Oral, Daily, Cosby, Courtney, MD, 10 mg at 08/25/22 0956   budesonide (PULMICORT) nebulizer solution 0.25 mg, 0.25 mg, Nebulization, BID, Ghimire, Shanker M, MD, 0.25 mg at 08/25/22 5170   carvedilol (COREG) tablet 3.125 mg, 3.125 mg, Oral, BID WC, Skip Mayer A, MD, 3.125 mg at 08/25/22 0956   escitalopram (LEXAPRO) tablet 5 mg, 5 mg, Oral, Daily, Rjay Revolorio A   folic acid (FOLVITE) tablet 1 mg, 1 mg,  Oral, Daily, Skip Mayer A, MD, 1 mg at 08/25/22 0956   furosemide (LASIX) tablet 40 mg, 40 mg, Oral, BID, Skip Mayer A, MD, 40 mg at 08/25/22 0956   gabapentin (NEURONTIN) capsule 400 mg, 400 mg, Oral, TID, Skip Mayer A, MD, 400 mg at 08/25/22 0956   hydrALAZINE (APRESOLINE) tablet 25 mg, 25 mg, Oral, Q6H PRN, Pahwani, Ravi, MD   insulin aspart (novoLOG) injection 0-9 Units, 0-9 Units, Subcutaneous, TID WC, Lurline Del, MD, 1 Units at 08/24/22 1725   lisinopril (ZESTRIL) tablet 10 mg, 10 mg, Oral, Daily, Pahwani, Ravi, MD, 10 mg at 08/25/22 0174   oxyCODONE (Oxy IR/ROXICODONE) immediate release tablet 5 mg, 5 mg, Oral, Q6H PRN, Ghimire, Werner Lean, MD, 5 mg at 08/25/22 1216   revefenacin (YUPELRI) nebulizer solution 175 mcg, 175 mcg, Nebulization, Daily, Ghimire, Werner Lean, MD, 175 mcg at 08/25/22 9449   thiamine (VITAMIN B1) tablet 100 mg, 100 mg, Oral, Daily, Skip Mayer A, MD, 100 mg at 08/25/22 0956  Allergies: Allergies  Allergen Reactions   Motrin [Ibuprofen] Anaphylaxis   Tylenol [Acetaminophen] Hives, Nausea And Vomiting and Rash   Lyrica Cr [Pregabalin Er] Other (See Comments)    Dystonia Dysarthria    Peanut-Containing Drug Products Other (See Comments)    Pt denies allergy to peanut butter 03/02/2021       Objective  Vital signs:  Temp:  [97.8 F (36.6 C)] 97.8 F (36.6 C) (09/25 0544) Pulse Rate:  [69-87] 78 (09/25 0828) Resp:  [15-20] 16 (09/25 0828) BP: (106-136)/(65-87) 128/87 (09/25 0544) SpO2:  [92 %-99 %] 99 % (09/24 2304)  Psychiatric Specialty Exam:  Presentation  General Appearance: Appropriate for Environment  Eye Contact:Good  Speech:-- (slightly slurred)  Speech Volume:Normal  Handedness:Right   Mood and Affect  Mood: some good some bad  Affect: full range   Thought Process  Thought Processes:Coherent; Goal Directed; Linear  Descriptions of Associations:Intact  Orientation:Full (Time, Place and  Person)  Thought Content:-- (no paranoia elicited today)  History of Schizophrenia/Schizoaffective disorder:Yes  Duration of Psychotic Symptoms:Greater than six months  Ideas of Reference:None  Sensorium  Memory:Immediate Fair; Recent Fair  Judgment:Poor  Insight:Fair   Executive Functions  Concentration:Fair  Attention Span:Fair  Recall:Fair  Fund of Knowledge:Fair  Language:Fair   Psychomotor Activity  Psychomotor Activity: Normal  Assets  Assets:Communication Skills; Desire for Improvement; Financial Resources/Insurance   Sleep  Sleep: Fair   Physical Exam: Physical Exam Vitals reviewed.  Constitutional:      General: He is not in acute distress.    Appearance: He is not diaphoretic.  Pulmonary:     Effort: No respiratory distress.  Musculoskeletal:     Comments: R AKA  Neurological:     Mental Status: He is alert.     Blood pressure 128/87, pulse 78, temperature 97.8 F (36.6 C), temperature source Oral, resp. rate 16, height 5'  9" (1.753 m), weight 86.2 kg, SpO2 99 %. Body mass index is 28.06 kg/m.

## 2022-08-25 NOTE — Progress Notes (Signed)
PROGRESS NOTE        PATIENT DETAILS Name: [name] Age: 56 y.o. Sex: male Date of Birth: [birth year] Admit Date: 08/16/2022 Admitting Physician Lurline Del, MD [id number], Chales Abrahams, NP  Brief Summary: Patient is a 56 y.o.  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  He was subsequently started on anticoagulation and admitted to the hospitalist service.  Further hospital course was complicated by patient having suicidal ideation-psychiatry was consulted-bedside sitter was placed-with recommendations to pursue inpatient psychiatric admission.  See below for further details.  Significant events: 5/4-5/12>> hospitalization for sepsis from cellulitis from chronic lower extremity wound. 8/12-8/25>> hospitalization for HFrEF exacerbation/hypoxia 9/16>> admit to TRH-pleuritic chest pain/shortness of breath-CTA with bilateral PE and pulmonary infarct.  Significant studies: 9/16>> CTA chest: Bilateral PE-right lung infarct. 9/16>> bilateral lower extremity angiogram: No major stenosis. 9/17>> x-ray left ankle: No fracture/dislocation. 9/17>> UDS: Positive for opiates/cocaine 9/18>> lower extremity Doppler: No DVT 9/18>> Limited echo: EF<20%, RV systolic function moderately reduced. 9/20>> CXR: Slight worsening of peripheral density in the right mid/lower lung.  Significant microbiology data: None  Procedures: None  Consults: Psychiatry  Subjective:  Patient in bed, appears comfortable, denies any headache, no fever, no chest pain or pressure, no shortness of breath , no abdominal pain. No new focal weakness.  Objective: Vitals: Blood pressure 128/87, pulse 78,  temperature 97.8 F (36.6 C), temperature source Oral, resp. rate 16, height 5\' 9"  (1.753 m), weight 86.2 kg, SpO2 99 %.   Exam:  Awake Alert, No new F.N deficits, Normal affect [place].AT,PERRAL Supple Neck, No JVD,   Symmetrical Chest wall movement, Good air movement bilaterally, CTAB RRR,No Gallops, Rubs or new Murmurs,  +ve B.Sounds, Abd Soft, No tenderness,   Right AKA-left great toe amputation.    Assessment/Plan:  Pulmonary embolism with right lung infarct: Pleuritic pain improving-continue Eliquis.  Etiology of VTE likely due to noncompliance.  Given that he has had VTE in the past-needs indefinite anticoagulation.   Chronic HFpEF/HFrEF: Euvolemic-continue Lasix/Coreg/lisinopril.  If compliant with this regimen-could be switched to Entresto at some point.  Follow with cardiology in the outpatient setting.   Cough: Suspect from GERD/lung infarct-although at risk for aspiration-do not think he has pneumonia-he may have chemical pneumonitis-Augmentin discontinued on 9/21.  Suicidal ideation: Appreciate psych input-awaiting inpatient psychiatric bed.  History of bipolar disorder: Continue Abilify.  History of CVA with right hemiparesis/facial droop: On anticoagulation  Peripheral neuropathy: Continues to have intermittent mostly left lower extremity pain-continue scheduled Neurontin-and as needed oxycodone for breakthrough pain.  DM-2 (A1c 6.0 on 9/17): CBG stable with SSI.  Will liberalize his diet and see what happens to his sugars.  Follow-up with  Recent Labs    08/24/22 1205 08/24/22 1625 08/24/22 2019  GLUCAP 106* 122* 98    History of right AKA-left great toe amputation  COPD: Not in exacerbation-continue Brovana/Yupelri/budesonide- will convert to inhaler regimen when closer to discharge.  Polysubstance abuse-cocaine: Counseled  Homelessness: Very vague about his situation-claims he sleeps wherever he can-eats whatever he can get-claims he is on a wheelchair (right  AKA/right hemiparesis).  Social worker following.  BMI: Estimated body mass  index is 28.06 kg/m as calculated from the following:   Height as of this encounter: 5\' 9"  (1.753 m).   Weight as of this encounter: 86.2 kg.   Code status:   Code Status: Full Code   DVT Prophylaxis: apixaban (ELIQUIS) tablet 5 mg     Family Communication: None at bedside   Disposition Plan: Status is: Inpatient Remains inpatient appropriate because: Suicidal ideation-sitter-awaiting inpatient psych bed.   Planned Discharge Destination: Inpatient psych bed-otherwise will likely require SNF.   Diet: Diet Order             Diet Heart Room service appropriate? No; Fluid consistency: Thin  Diet effective now                    MEDICATIONS: Scheduled Meds:  apixaban  5 mg Oral BID   arformoterol  15 mcg Nebulization BID   ARIPiprazole  10 mg Oral Daily   budesonide (PULMICORT) nebulizer solution  0.25 mg Nebulization BID   carvedilol  3.125 mg Oral BID WC   folic acid  1 mg Oral Daily   furosemide  40 mg Oral BID   gabapentin  400 mg Oral TID   insulin aspart  0-9 Units Subcutaneous TID WC   lisinopril  10 mg Oral Daily   revefenacin  175 mcg Nebulization Daily   thiamine  100 mg Oral Daily   Continuous Infusions: PRN Meds:.hydrALAZINE, oxyCODONE   I have personally reviewed following labs and imaging studies  LABORATORY DATA:  Recent Labs  Lab 08/18/22 1449 08/24/22 0620  WBC 9.2 5.5  HGB 12.0* 12.7*  HCT 36.6* 38.7*  PLT 256 386  MCV 89.7 88.0  MCH 29.4 28.9  MCHC 32.8 32.8  RDW 17.9* 17.2*  LYMPHSABS 2.0  --   MONOABS 1.0  --   EOSABS 0.1  --   BASOSABS 0.0  --     Recent Labs  Lab 08/20/22 1032 08/24/22 0620  NA  --  138  K  --  4.3  CL  --  101  CO2  --  23  GLUCOSE  --  97  BUN  --  15  CREATININE  --  0.86  CALCIUM  --  9.2  MG  --  2.0  PROCALCITON <0.10  --          RADIOLOGY STUDIES/RESULTS: No results found.   LOS: 8 days    Signature  Lala Lund M.D on 08/25/2022 at 10:30 AM   -  To page go to [email or web address]

## 2022-08-26 DIAGNOSIS — F1414 Cocaine abuse with cocaine-induced mood disorder: Secondary | ICD-10-CM | POA: Diagnosis not present

## 2022-08-26 LAB — GLUCOSE, CAPILLARY
Glucose-Capillary: 105 mg/dL — ABNORMAL HIGH (ref 70–99)
Glucose-Capillary: 114 mg/dL — ABNORMAL HIGH (ref 70–99)
Glucose-Capillary: 102 mg/dL — ABNORMAL HIGH (ref 70–99)
Glucose-Capillary: 144 mg/dL — ABNORMAL HIGH (ref 70–99)

## 2022-08-26 MED ORDER — DEXTROSE 50 % IV SOLN
INTRAVENOUS | Status: AC
  Filled 2022-08-26: qty 50

## 2022-08-26 NOTE — TOC Progression Note (Signed)
Transition of Care [facility]) - Progression Note    Patient Details  Name: [name] MRN: [id number] Date of Birth: [birth year]  Transition of Care [facility]) CM/SW [place], [name] Phone Number: 08/26/2022, 3:59 PM  Clinical Narrative:    CSW notified that patient has been cleared by psychiatry and is now recommended for SNF. CSW sent out referrals. Barriers include cocaine use, homelessness, psych history, and Medicaid. Patient has been unable to be placed in prior admissions due to these reasons.    Expected Discharge Plan: Home/Self Care Barriers to Discharge: Continued Medical Work up  Expected Discharge Plan and Services Expected Discharge Plan: Home/Self Care In-house Referral: Clinical Social Work     Living arrangements for the past 2 months: Homeless                                       Social Determinants of Health ([place]) Interventions    Readmission Risk Interventions    08/18/2022    9:00 AM 07/18/2022    4:11 PM 04/08/2022    1:43 PM  Readmission Risk Prevention Plan  Transportation Screening Complete Complete Complete  Medication Review Press photographer) Complete Complete Complete  PCP or Specialist appointment within 3-5 days of discharge Complete Complete Complete  HRI or Home Care Consult Complete Complete Not Complete  HRI or Home Care Consult Pt Refusal Comments   Pt is homeless  SW Recovery Care/Counseling Consult Complete Complete Complete  Palliative Care Screening Not Applicable  Not [place] Not Applicable Complete Not Applicable

## 2022-08-26 NOTE — Progress Notes (Signed)
PROGRESS NOTE        PATIENT DETAILS Name: [name] Age: 56 y.o. Sex: male Date of Birth: 28-Nov-1966 Admit Date: 08/16/2022 Admitting Physician Lurline Del, MD [id number], Chales Abrahams, NP  Brief Summary: Patient is a 56 y.o.  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  He was subsequently started on anticoagulation and admitted to the hospitalist service.  Further hospital course was complicated by patient having suicidal ideation-psychiatry was consulted-bedside sitter was placed-with recommendations to pursue inpatient psychiatric admission.  See below for further details.  Significant events: 5/4-5/12>> hospitalization for sepsis from cellulitis from chronic lower extremity wound. 8/12-8/25>> hospitalization for HFrEF exacerbation/hypoxia 9/16>> admit to TRH-pleuritic chest pain/shortness of breath-CTA with bilateral PE and pulmonary infarct.  Significant studies: 9/16>> CTA chest: Bilateral PE-right lung infarct. 9/16>> bilateral lower extremity angiogram: No major stenosis. 9/17>> x-ray left ankle: No fracture/dislocation. 9/17>> UDS: Positive for opiates/cocaine 9/18>> lower extremity Doppler: No DVT 9/18>> Limited echo: EF<20%, RV systolic function moderately reduced. 9/20>> CXR: Slight worsening of peripheral density in the right mid/lower lung.  Significant microbiology data: None  Procedures: None  Consults: Psychiatry  Subjective:  Patient in bed, appears comfortable, denies any headache, no fever, no chest pain or pressure, no shortness of breath , no abdominal pain. No new focal weakness.   Objective: Vitals: Blood pressure 132/75, pulse  81, temperature 97.9 F (36.6 C), temperature source Oral, resp. rate 11, height 5\' 9"  (1.753 m), weight 86.2 kg, SpO2 99 %.   Exam:  Awake Alert, No new F.N deficits, Normal affect [place].AT,PERRAL Supple Neck, No JVD,   Symmetrical Chest wall movement, Good air movement bilaterally, CTAB RRR,No Gallops, Rubs or new Murmurs,  +ve B.Sounds, Abd Soft, No tenderness,   Right AKA-left great toe amputation.    Assessment/Plan:  Pulmonary embolism with right lung infarct: Pleuritic pain improving-continue Eliquis.  Etiology of VTE likely due to noncompliance.  Given that he has had VTE in the past-needs indefinite anticoagulation.   Chronic HFpEF/HFrEF: Euvolemic-continue Lasix/Coreg/lisinopril.  If compliant with this regimen-could be switched to Entresto at some point.  Follow with cardiology in the outpatient setting.   Cough: Suspect from GERD/lung infarct-although at risk for aspiration-do not think he has pneumonia-he may have chemical pneumonitis-Augmentin discontinued on 9/21.  Suicidal ideation: Appreciate psych input-now stable cleared by psych.  History of bipolar disorder: Continue Abilify.   History of CVA with right hemiparesis/facial droop: On anticoagulation  Peripheral neuropathy: Continues to have intermittent mostly left lower extremity pain-continue scheduled Neurontin-and as needed oxycodone for breakthrough pain.  DM-2 (A1c 6.0 on 9/17): CBG stable with SSI.  Will liberalize his diet and see what happens to his sugars.  Follow-up with  Recent Labs    08/25/22 1602 08/25/22 2115 08/26/22 0738  GLUCAP 114* 125* 105*    History of right AKA-left great toe amputation  COPD: Not in exacerbation-continue Brovana/Yupelri/budesonide- will convert to inhaler regimen when closer to discharge.  Polysubstance abuse-cocaine: Counseled  Homelessness: Very vague about his situation-claims he sleeps wherever he can-eats whatever he can get-claims he is on a wheelchair (right  AKA/right hemiparesis).  Social worker following.  BMI:  Estimated body mass index is 28.06 kg/m as calculated from the following:   Height as of this encounter: 5\' 9"  (1.753 m).   Weight as of this encounter: 86.2 kg.   Code status:   Code Status: Full Code   DVT Prophylaxis: apixaban (ELIQUIS) tablet 5 mg     Family Communication: None at bedside   Disposition Plan: Status is: Inpatient Remains inpatient appropriate because: Suicidal ideation-sitter-awaiting inpatient psych bed.   Planned Discharge Destination: Inpatient psych bed-otherwise will likely require SNF.   Diet: Diet Order             Diet Heart Room service appropriate? No; Fluid consistency: Thin  Diet effective now                    MEDICATIONS: Scheduled Meds:  apixaban  5 mg Oral BID   arformoterol  15 mcg Nebulization BID   ARIPiprazole  10 mg Oral Daily   budesonide (PULMICORT) nebulizer solution  0.25 mg Nebulization BID   carvedilol  3.125 mg Oral BID WC   escitalopram  5 mg Oral Daily   folic acid  1 mg Oral Daily   furosemide  40 mg Oral BID   gabapentin  400 mg Oral TID   insulin aspart  0-9 Units Subcutaneous TID WC   lisinopril  10 mg Oral Daily   revefenacin  175 mcg Nebulization Daily   thiamine  100 mg Oral Daily   Continuous Infusions: PRN Meds:.hydrALAZINE, oxyCODONE   I have personally reviewed following labs and imaging studies  LABORATORY DATA:  Recent Labs  Lab 08/24/22 0620  WBC 5.5  HGB 12.7*  HCT 38.7*  PLT 386  MCV 88.0  MCH 28.9  MCHC 32.8  RDW 17.2*    Recent Labs  Lab 08/20/22 1032 08/24/22 0620  NA  --  138  K  --  4.3  CL  --  101  CO2  --  23  GLUCOSE  --  97  BUN  --  15  CREATININE  --  0.86  CALCIUM  --  9.2  MG  --  2.0  PROCALCITON <0.10  --          RADIOLOGY STUDIES/RESULTS: No results found.   LOS: 9 days   Signature  Lala Lund M.D on 08/26/2022 at 10:41 AM   -  To page go to [email or web address]

## 2022-08-26 NOTE — Consult Note (Signed)
[facility] Followup Face-to-Face Psychiatric Evaluation   Name: [name] DOB: 04/14/1966 MRN: [id number] Service Date: August 26, 2022 LOS:  LOS: 9 days  Reason for Consult: "Suicidal ideation" Referring Provider: Darliss Cheney, MD   Assessment  [name] is a 56 y.o. male admitted medically for 08/16/2022  3:03 PM for chest pain, dyspnea, and suicidal ideation for the past 2 weeks. He carries the psychiatric diagnoses of schizoaffective disorder bipolar type and has a past medical history of T2DM, DVT, chronic combined diastolic systolic heart failure, right AKA, and normocytic anemia.   His current presentation is most consistent with suicidal ideation in the depressive episode of schizoaffective bipolar type, as a sequelae of current physical symptomology. He continues to meet criteria for inpatient psychiatric admission based on the fact that he is not psychotic and actively endorsing SI with a plan and intent. However, historically, patient has a history of malingering, and currently has a sense of impending doom from his PE that seems to be the primary driver in his desire to end his life, which patient states on assessment today. Current outpatient psychotropic medications include Abilify 5 mg and historically he has had a good response to this medication. His home medication has since been increased to 10 mg, with plan to continue to titrate as needed. He was not compliant with medications prior to admission as evidenced by patient initial reporting. He has been medication compliant thus far this admission. Please see plan below for detailed recommendations.   9/24: mild improvement noted on 2 consecutive assessments.   9/25: pt with no SI, HI, AH/VH although ongoing feelings of depression; had frank discussion and will be rescinding rec for inpt psyc; psych will continue to follow  While future psychiatric events cannot be accurately predicted, the patient  does not currently require acute inpatient psychiatric care and does not currently meet [facility] involuntary commitment criteria.   Diagnoses:  Active Hospital problems: Principal Problem:   Pulmonary emboli (HCC) Active Problems:   Cocaine use disorder, severe, dependence (HCC)   Cocaine abuse with cocaine-induced mood disorder ([place])   Homeless   Suicidal ideation   Essential hypertension   Bipolar I disorder (HCC)   Chronic combined systolic and diastolic congestive heart failure ([place])     Plan  ## Safety and Observation Level:  - Based on my clinical evaluation, I estimate the patient to be at low risk of self harm in the current settin  ## Medications:  -- Continue Abilify 10 mg daily  -- s lexapro 5 daily   ## Medical Decision Making Capacity:  Formal decision making capacity was not assessed for any particular decision as part of routine psychiatric evaluation.   ## Further Work-up:  -Recommend maintaining electrolytes, K+ greater than 4 and Mg greater than 2   -- most recent EKG on 9/19 had QtC of 497; corrected QTc Bazett 413 -- Pertinent labwork reviewed earlier this admission includes:  UDS positive cocaine and opiates Calcium 8.0, but albumin 2.7 Potassium 2.4 on admission, 4.7 today TSH 1.974 A1c 6.0% Magnesium 1.8   ## Disposition:  -- per primary likely SNF   ##Legal Status Patient currently voluntary  Thank you for this consult request. Recommendations have been communicated to the primary team.  We will continue to follow at this time.   Pasco Marchitto A Devyne Hauger   Follow-Up history  Relevant Aspects of Hospital Course:  Admitted on 08/16/2022 for left lower extremity swelling and dyspnea, found to have PE.  NAEON, and no behavioral PRNs required.  Patient Report:  Again had long pleasant conversation with pt. Shared some of his frustrations about physical limitations. Had frank discussion of the effect of cocaine on his mental and  physical health, spurred by his frustration with amputation. No SI, HI, AH/VH. He is hopeful he can go back to [place].     Psychiatric History/Social/Family/Medical/Surgical history/allergies:  See earlier consult notes  Medications:   Current Facility-Administered Medications:    [COMPLETED] apixaban (ELIQUIS) tablet 10 mg, 10 mg, Oral, BID, 10 mg at 08/23/22 2058 **FOLLOWED BY** apixaban (ELIQUIS) tablet 5 mg, 5 mg, Oral, BID, Bell, Lorin C, RPH, 5 mg at 08/26/22 0848   arformoterol (BROVANA) nebulizer solution 15 mcg, 15 mcg, Nebulization, BID, Ghimire, Werner Lean, MD, 15 mcg at 08/26/22 0809   ARIPiprazole (ABILIFY) tablet 10 mg, 10 mg, Oral, Daily, Cosby, Courtney, MD, 10 mg at 08/26/22 0944   budesonide (PULMICORT) nebulizer solution 0.25 mg, 0.25 mg, Nebulization, BID, Ghimire, Shanker M, MD, 0.25 mg at 08/26/22 0809   carvedilol (COREG) tablet 3.125 mg, 3.125 mg, Oral, BID WC, Skip Mayer A, MD, 3.125 mg at 08/26/22 0848   escitalopram (LEXAPRO) tablet 5 mg, 5 mg, Oral, Daily, Kedron Uno A, 5 mg at 08/26/22 0848   folic acid (FOLVITE) tablet 1 mg, 1 mg, Oral, Daily, Skip Mayer A, MD, 1 mg at 08/26/22 0848   furosemide (LASIX) tablet 40 mg, 40 mg, Oral, BID, Skip Mayer A, MD, 40 mg at 08/26/22 0848   gabapentin (NEURONTIN) capsule 400 mg, 400 mg, Oral, TID, Skip Mayer A, MD, 400 mg at 08/26/22 0848   hydrALAZINE (APRESOLINE) tablet 25 mg, 25 mg, Oral, Q6H PRN, Pahwani, Ravi, MD   insulin aspart (novoLOG) injection 0-9 Units, 0-9 Units, Subcutaneous, TID WC, Lurline Del, MD, 1 Units at 08/24/22 1725   lisinopril (ZESTRIL) tablet 10 mg, 10 mg, Oral, Daily, Pahwani, Ravi, MD, 10 mg at 08/26/22 0848   oxyCODONE (Oxy IR/ROXICODONE) immediate release tablet 5 mg, 5 mg, Oral, Q6H PRN, Maretta Bees, MD, 5 mg at 08/25/22 1216   revefenacin (YUPELRI) nebulizer solution 175 mcg, 175 mcg, Nebulization, Daily, Ghimire, Werner Lean, MD, 175 mcg  at 08/26/22 0809   thiamine (VITAMIN B1) tablet 100 mg, 100 mg, Oral, Daily, Skip Mayer A, MD, 100 mg at 08/26/22 0849     Objective  Vital signs:  Temp:  [97.9 F (36.6 C)-98.1 F (36.7 C)] 97.9 F (36.6 C) (09/26 1136) Pulse Rate:  [67-81] 71 (09/26 1136) Resp:  [11-21] 12 (09/26 1136) BP: (118-137)/(71-83) 121/76 (09/26 1136) SpO2:  [96 %-100 %] 99 % (09/26 1136)  Psychiatric Specialty Exam:  Presentation  General Appearance: Appropriate for Environment  Eye Contact:Good  Speech:-- (slightly slurred)  Speech Volume:Normal  Handedness:Right   Mood and Affect  Mood: some good some bad  Affect: full range   Thought Process  Thought Processes:Coherent; Goal Directed; Linear  Descriptions of Associations:Intact  Orientation:Full (Time, Place and Person)  Thought Content:-- (no paranoia elicited today)  History of Schizophrenia/Schizoaffective disorder:Yes  Duration of Psychotic Symptoms:Greater than six months  Ideas of Reference:None  Sensorium  Memory:Immediate Fair; Recent Fair  Judgment:Poor  Insight:Fair   Executive Functions  Concentration:Fair  Attention Span:Fair  Recall:Fair  Fund of Knowledge:Fair  Language:Fair   Psychomotor Activity  Psychomotor Activity: Normal  Assets  Assets:Communication Skills; Desire for Improvement; Financial Resources/Insurance   Sleep  Sleep: Fair   Physical Exam: Physical Exam Vitals reviewed.  Constitutional:  General: He is not in acute distress.    Appearance: He is not diaphoretic.  Pulmonary:     Effort: No respiratory distress.  Musculoskeletal:     Comments: R AKA  Neurological:     Mental Status: He is alert.     Blood pressure 121/76, pulse 71, temperature 97.9 F (36.6 C), temperature source Oral, resp. rate 12, height 5\' 9"  (1.753 m), weight 86.2 kg, SpO2 99 %. Body mass index is 28.06 kg/m.

## 2022-08-26 NOTE — Progress Notes (Signed)
Occupational Therapy Treatment Patient Details Name: [name] MRN: [id number] DOB: [birth year] Today's Date: 08/26/2022   History of present illness Pt is a 56yo M who presented 9/16 with c/o chest pain, SOB, and leg swelling. Found to have B PE. PMH includes asthma, bioplar 1, depression, schizoaffective disorder, CVA, chronic CHF, homelessness, HTN, DM2, cocaine and cannabis use and R AKA.   OT comments  Limited progress today as pt with difficulty attending to and following instructions for compensatory strategies for LE dressing. Pt attempting 3-fingered 1-handed technique for socks but required demonstration of position at EOB to support LLE and of technique several times but pt still not able to demonstrate back. Backward chaining used with OT donning pt's sock over his toes using LT hand only as pt given step by step cues. Pt then able to pull sock up at heal. Pt also addressing bed level donning/doffing of clothes over hips but will need reinforcement. Pt perseverating on his LT ankle wounds throughout session, and RN was notified of pt's request for nursing to come look at wounds.  Patient remains limited by cognitive deficits, RT UE limitations from past CVA, LLE pain, generalized weakness and decreased activity tolerance along with deficits noted below. Pt continues to demonstrate good rehab potential and would benefit from continued skilled OT to increase safety and independence with ADLs and functional transfers to allow pt to return home safely and reduce caregiver burden and fall risk.     Recommendations for follow up therapy are one component of a multi-disciplinary discharge planning process, led by the attending physician.  Recommendations may be updated based on patient status, additional functional criteria and insurance authorization.    Follow Up Recommendations  Skilled nursing-short term rehab (<3 hours/day)    Assistance Recommended at Discharge Frequent or constant  Supervision/Assistance  Patient can return home with the following  A little help with walking and/or transfers;A little help with bathing/dressing/bathroom   Equipment Recommendations  Wheelchair (measurements OT)    Recommendations for Other Services      Precautions / Restrictions Precautions Precautions: Fall Restrictions Weight Bearing Restrictions: No       Mobility Bed Mobility Overal bed mobility: Modified Independent             General bed mobility comments: Mod I with bed mobility, but supervision recommended due to cognitive deficits    Transfers                         Balance Overall balance assessment: Needs assistance Sitting-balance support: No upper extremity supported Sitting balance-Leahy Scale: Good                                     ADL either performed or assessed with clinical judgement   ADL Overall ADL's : Needs assistance/impaired     Grooming: Set up;Supervision/safety;Sitting Grooming Details (indicate cue type and reason): EOB             Lower Body Dressing: Moderate assistance;Cueing for safety;Sitting/lateral leans;Cueing for sequencing;Cueing for compensatory techniques Lower Body Dressing Details (indicate cue type and reason): Worked on different techniques to address LE dressing. Pt having difficulty following verbal cues, so chair brough to room for OT to demonstrate use of pt's bed to scoot and support LLE on bed to allow pt to easily reach foot with LT hand for 1-handed dressing technique as pt reports  h/o CVA with RT hand deficits. Pt attempting to follow demonstration and step by step verbal cues with continued difficulty. Ultimately pt required backwards chaining with OT demonstrating donning sock over pt's LT toes with use of LT hand only. Pt then able to pull sock up at heel.  Moved to pants/shorts and pt reported that he depends on a bathroom grab bar to pull self up and then pull clothing over  hips "But there's not always a grab bar handy." Pt shown how to bridge with use of LLE and to roll RT and LT to don/doff pants over hips if grab bar not available. Pt able to demonstrate all movements back with supervision and cues.               General ADL Comments: Pt moving around in bed all Mod I. Supine<>Sit, rolling, scooting. Pt declined OOB this visit due to pain    Extremity/Trunk Assessment Upper Extremity Assessment Upper Extremity Assessment: RUE deficits/detail RUE Deficits / Details: Ataxic RUE from prior CVA. Ataxic during all intentional reaching and AROM at the shoulder limited to 90 degrees. MMT 3+/5 overall distally except hand with Negative composite grip. RUE Sensation: decreased light touch RUE Coordination: decreased gross motor;decreased fine motor            Vision Patient Visual Report: No change from baseline     Perception     Praxis      Cognition Arousal/Alertness: Awake/alert Behavior During Therapy: WFL for tasks assessed/performed Overall Cognitive Status: No family/caregiver present to determine baseline cognitive functioning Area of Impairment: Attention, Memory, Following commands, Safety/judgement, Awareness, Problem solving                   Current Attention Level: Selective Memory: Decreased short-term memory Following Commands: Follows one step commands with increased time, Follows multi-step commands inconsistently Safety/Judgement: Decreased awareness of safety Awareness: Emergent Problem Solving: Slow processing, Difficulty sequencing, Requires verbal cues, Requires tactile cues General Comments: Persverates on wounds to LT ankle. Very pleasant        Exercises      Shoulder Instructions       General Comments      Pertinent Vitals/ Pain       Pain Assessment Pain Assessment: Faces Faces Pain Scale: Hurts little more Pain Location: L foot/ankle Pain Descriptors / Indicators: Sore Pain Intervention(s):  Limited activity within patient's tolerance, Monitored during session, Patient requesting pain meds-RN notified  Home Living                                          Prior Functioning/Environment              Frequency  Min 3X/week        Progress Toward Goals  OT Goals(current goals can now be found in the care plan section)  Progress towards OT goals: Progressing toward goals  Acute Rehab OT Goals Patient Stated Goal: Wounds to heal. OT Goal Formulation: With patient Time For Goal Achievement: 09/04/22 Potential to Achieve Goals: Good  Plan Discharge plan remains appropriate    Co-evaluation                 AM-PAC OT "6 Clicks" Daily Activity     Outcome Measure   Help from another person eating meals?: None Help from another person taking care of personal grooming?: None Help from another  person toileting, which includes using toliet, bedpan, or urinal?: A Little Help from another person bathing (including washing, rinsing, drying)?: A Little Help from another person to put on and taking off regular upper body clothing?: A Little Help from another person to put on and taking off regular lower body clothing?: A Little 6 Click Score: 20    End of Session    OT Visit Diagnosis: Unsteadiness on feet (R26.81);Muscle weakness (generalized) (M62.81);Other abnormalities of gait and mobility (R26.89)   Activity Tolerance Patient tolerated treatment well   Patient Left in bed;with call bell/phone within reach   Nurse Communication Other (comment) (Pt refused bed alarm. Pt asking RN to put eyes on LT ankle wounds)        Time: 1355-1419 OT Time Calculation (min): 24 min  Charges: OT General Charges $OT Visit: 1 Visit OT Treatments $Self Care/Home Management : 8-22 mins $Therapeutic Activity: 8-22 mins  Anderson Malta, OT Acute Rehab Services Office: [phone] 08/26/2022  Julien Girt 08/26/2022, 2:31 PM

## 2022-08-26 NOTE — [place] FL2 (Signed)
MEDICAID FL2 LEVEL OF CARE SCREENING TOOL     IDENTIFICATION  Patient Name: [name] Birthdate: 14-Nov-1966 Sex: male Admission Date (Current Location): 08/16/2022  [facility] and [place] Number:  Producer, television/film/video and Address:  [facility], [address], [place], [name] [address]      Provider Number: [id number]  Attending Physician Name and Address:  Leroy Sea, MD  Relative Name and Phone Number:       Current Level of Care: Hospital Recommended Level of Care: Skilled Nursing Facility Prior Approval Number:    Date Approved/Denied:   PASRR Number: pending  Discharge Plan: SNF    Current Diagnoses: Patient Active Problem List   Diagnosis Date Noted   Pulmonary emboli (HCC) 08/17/2022   Venous ulcer (HCC) 07/23/2022   Sinus bradycardia 07/23/2022   Anemia of chronic disease 07/23/2022   S/P AKA (above knee amputation), right (HCC) 07/23/2022   Acute on chronic systolic congestive heart failure (HCC) 07/12/2022   History of DVT (deep vein thrombosis) 07/12/2022   CAP (community acquired pneumonia) 07/12/2022   Chronic venous hypertension (idiopathic) with ulcer and inflammation of bilateral lower extremity (HCC)    Sepsis due to cellulitis (HCC) 04/04/2022   Normocytic anemia 04/04/2022   Acute deep vein thrombosis (DVT) of brachial vein (HCC) 03/31/2022   Swelling of lower extremity 03/23/2022   SIRS (systemic inflammatory response syndrome) (HCC) 03/23/2022   Leg swelling 03/23/2022   Acute on chronic systolic heart failure (HCC) 02/26/2022   Seizure (HCC) 02/26/2022   Venous stasis ulcer of right ankle limited to breakdown of skin with varicose veins (HCC)    Hypertensive urgency 01/29/2022   Prolonged QT interval 01/29/2022   Alcohol abuse 01/29/2022   chronic ulcers 01/29/2022   Wound infection 12/04/2021   Open wounds involving multiple regions of lower extremity 12/03/2021   Mixed diabetic hyperlipidemia  associated with type 2 diabetes mellitus (HCC) 12/03/2021   Cellulitis of right lower extremity 11/13/2021   Type 2 diabetes mellitus with diabetic polyneuropathy, without long-term current use of insulin (HCC) 11/13/2021   Antisocial personality disorder (HCC) 11/04/2021   Malingering 11/04/2021   Foot pain 04/23/2021   Chronic combined systolic and diastolic congestive heart failure (HCC) 03/02/2021   History of stroke 03/02/2021   Polysubstance abuse (HCC) 03/02/2021   Skin ulcer of right great toe with necrosis of bone (HCC)    Encounter for assessment of healthcare decision-making capacity    Pain, dental    Dystonia    Dysarthria    Shock liver 01/04/2021   Acute metabolic encephalopathy 01/04/2021   Coronary artery disease involving native coronary artery of native heart without angina pectoris 01/04/2021   Essential hypertension 01/04/2021   Bipolar I disorder (HCC) 01/04/2021   Cocaine abuse (HCC) 01/04/2021   Homelessness 01/04/2021   HLD (hyperlipidemia) 01/04/2021   Osteomyelitis of great toe (HCC)    Ulcer of great toe, left, with necrosis of bone (HCC)    Ulcer of great toe, right, with necrosis of muscle (HCC)    Acute osteomyelitis of toe, left (HCC)    Acute on chronic combined systolic and diastolic CHF (congestive heart failure) (HCC) 12/20/2020   Respiratory failure (HCC)    Ischemic stroke (HCC) 12/17/2020   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   Diabetic foot ulcer (HCC) 12/08/2020   CHF (congestive heart failure) (HCC) 11/14/2020   CHF exacerbation (HCC) 11/13/2020   Hypokalemia    Hyperlipidemia    Acute hypoxemic  respiratory failure (HCC) 10/26/2020   COPD with acute exacerbation (HCC) 10/14/2020   Acute on chronic combined systolic and diastolic CHF (congestive heart failure) (HCC) 10/02/2020   Heart failure with reduced ejection fraction and diastolic dysfunction (HCC) 09/07/2020   Near syncope 07/21/2020   Acid reflux 06/03/2020   Cannabis abuse  03/19/2020   Acute bronchitis due to Rhinovirus 09/08/2019   Schizoaffective disorder (HCC) 06/24/2019   SOB (shortness of breath)    QT prolongation 05/25/2019   Cocaine abuse (HCC)    Elevated troponin    Homeless    Suicidal ideation    Bilateral lower extremity pain 04/05/2019   Housing situation unstable 03/28/2019   Bilateral lower extremity edema    Acute exacerbation of CHF (congestive heart failure) (HCC) 03/04/2019   Alcohol withdrawal syndrome, with delirium (HCC) 05/03/2018   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   Mild intermittent asthma without complication 08/28/2017   Asthma 12/26/2016   Tobacco use disorder 12/26/2016   Schizophrenia (HCC) 12/26/2016   Severe episode of recurrent major depressive disorder, with psychotic features (HCC) 10/31/2016   Cocaine abuse with cocaine-induced mood disorder (HCC) 09/06/2016   Alcohol use disorder, mild, abuse 07/31/2016   Substance induced mood disorder (HCC) 07/30/2016   Schizoaffective disorder, bipolar type (HCC) 05/31/2016   Cocaine use disorder, severe, dependence (HCC) 05/31/2016   Major depressive disorder, recurrent episode (HCC) 05/30/2016   Suicidal behavior with attempted self-injury (HCC) 05/08/2016   Acetaminophen overdose 05/06/2016    Orientation RESPIRATION BLADDER Height & Weight     Self, Situation, Place, Time  Normal Continent Weight: 190 lb (86.2 kg) Height:  5\' 9"  (175.3 cm)  BEHAVIORAL SYMPTOMS/MOOD NEUROLOGICAL BOWEL NUTRITION STATUS      Continent Diet (see dc summary)  AMBULATORY STATUS COMMUNICATION OF NEEDS Skin   Limited Assist Verbally Normal                       Personal Care Assistance Level of Assistance  Bathing, Feeding, Dressing Bathing Assistance: Limited assistance Feeding assistance: Independent Dressing Assistance: Limited assistance     Functional Limitations Info             SPECIAL CARE FACTORS FREQUENCY                        Contractures Contractures Info: Not present    Additional Factors Info  Code Status, Allergies, Psychotropic Code Status Info: Full Allergies Info: Motrin (Ibuprofen), Tylenol (Acetaminophen), Lyrica Cr (Pregabalin Er), Peanut-containing Drug Products Psychotropic Info: Lexapro         Current Medications (08/26/2022):  This is the current hospital active medication list Current Facility-Administered Medications  Medication Dose Route Frequency Provider Last Rate Last Admin   apixaban (ELIQUIS) tablet 5 mg  5 mg Oral BID Bell, Lorin C, RPH   5 mg at 08/26/22 0848   arformoterol (BROVANA) nebulizer solution 15 mcg  15 mcg Nebulization BID 08/28/22, MD   15 mcg at 08/26/22 0809   ARIPiprazole (ABILIFY) tablet 10 mg  10 mg Oral Daily 08/28/22, MD   10 mg at 08/26/22 0944   budesonide (PULMICORT) nebulizer solution 0.25 mg  0.25 mg Nebulization BID 08/28/22, MD   0.25 mg at 08/26/22 0809   carvedilol (COREG) tablet 3.125 mg  3.125 mg Oral BID WC 08/28/22 A, MD   3.125 mg at 08/26/22 0848   escitalopram (LEXAPRO) tablet 5 mg  5 mg  Oral Daily Cinderella, Margaret A   5 mg at 38/45/36 4680   folic acid (FOLVITE) tablet 1 mg  1 mg Oral Daily Myles Rosenthal A, MD   1 mg at 08/26/22 0848   furosemide (LASIX) tablet 40 mg  40 mg Oral BID Myles Rosenthal A, MD   40 mg at 08/26/22 0848   gabapentin (NEURONTIN) capsule 400 mg  400 mg Oral TID Myles Rosenthal A, MD   400 mg at 08/26/22 0848   hydrALAZINE (APRESOLINE) tablet 25 mg  25 mg Oral Q6H PRN Darliss Cheney, MD       insulin aspart (novoLOG) injection 0-9 Units  0-9 Units Subcutaneous TID WC Clance Boll, MD   1 Units at 08/24/22 1725   lisinopril (ZESTRIL) tablet 10 mg  10 mg Oral Daily Darliss Cheney, MD   10 mg at 08/26/22 0848   oxyCODONE (Oxy IR/ROXICODONE) immediate release tablet 5 mg  5 mg Oral Q6H PRN Jonetta Osgood, MD   5 mg at 08/25/22 1216   revefenacin (YUPELRI) nebulizer  solution 175 mcg  175 mcg Nebulization Daily Jonetta Osgood, MD   175 mcg at 08/26/22 0809   thiamine (VITAMIN B1) tablet 100 mg  100 mg Oral Daily Clance Boll, MD   100 mg at 08/26/22 3212     Discharge Medications: Please see discharge summary for a list of discharge medications.  Relevant Imaging Results:  Relevant Lab Results:   Additional Information SSN: [id number]  Benard Halsted, [name]

## 2022-08-27 DIAGNOSIS — F1414 Cocaine abuse with cocaine-induced mood disorder: Secondary | ICD-10-CM | POA: Diagnosis not present

## 2022-08-27 LAB — GLUCOSE, CAPILLARY
Glucose-Capillary: 151 mg/dL — ABNORMAL HIGH (ref 70–99)
Glucose-Capillary: 86 mg/dL (ref 70–99)
Glucose-Capillary: 116 mg/dL — ABNORMAL HIGH (ref 70–99)
Glucose-Capillary: 101 mg/dL — ABNORMAL HIGH (ref 70–99)

## 2022-08-27 NOTE — Progress Notes (Signed)
PROGRESS NOTE        PATIENT DETAILS Name: [name] Age: 56 y.o. Sex: male Date of Birth: 1966-07-31 Admit Date: 08/16/2022 Admitting Physician Clance Boll, MD AYT:[id number], [name], NP  Brief Summary: Patient is a 56 y.o.  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  He was subsequently started on anticoagulation and admitted to the hospitalist service.  Further hospital course was complicated by patient having suicidal ideation-psychiatry was consulted-bedside sitter was placed-with recommendations to pursue inpatient psychiatric admission.  See below for further details.  Significant events: 5/4-5/12>> hospitalization for sepsis from cellulitis from chronic lower extremity wound. 8/12-8/25>> hospitalization for HFrEF exacerbation/hypoxia 9/16>> admit to [facility]-pleuritic chest pain/shortness of breath-CTA with bilateral PE and pulmonary infarct.  Significant studies: 9/16>> CTA chest: Bilateral PE-right lung infarct. 9/16>> bilateral lower extremity angiogram: No major stenosis. 9/17>> x-ray left ankle: No fracture/dislocation. 9/17>> UDS: Positive for opiates/cocaine 9/18>> lower extremity Doppler: No DVT 9/18>> Limited echo: XN<23%, RV systolic function moderately reduced. 9/20>> CXR: Slight worsening of peripheral density in the right mid/lower lung.  Significant microbiology data: None  Procedures: None  Consults: Psychiatry  Subjective:  Patient in bed, appears comfortable, denies any headache, no fever, no chest pain or pressure, no shortness of breath , no abdominal pain. No new focal weakness.   Objective: Vitals: Blood pressure 111/80, pulse  71, temperature 97.6 F (36.4 C), temperature source Axillary, resp. rate 18, height 5\' 9"  (1.753 m), weight 86.2 kg, SpO2 98 %.   Exam:  Awake Alert, No new F.N deficits, Normal affect [place].AT,PERRAL Supple Neck, No JVD,   Symmetrical Chest wall movement, Good air movement bilaterally, CTAB RRR,No Gallops, Rubs or new Murmurs,  +ve B.Sounds, Abd Soft, No tenderness,   Right AKA-left great toe amputation.    Assessment/Plan:  Pulmonary embolism with right lung infarct: Pleuritic pain improving-continue Eliquis.  Etiology of VTE likely due to noncompliance.  Given that he has had VTE in the past-needs indefinite anticoagulation.   Chronic HFpEF/HFrEF: Euvolemic-continue Lasix/Coreg/lisinopril.  If compliant with this regimen-could be switched to Entresto at some point.  Follow with cardiology in the outpatient setting.   Cough: Suspect from GERD/lung infarct-although at risk for aspiration-do not think he has pneumonia-he may have chemical pneumonitis-Augmentin discontinued on 9/21.  Suicidal ideation: Appreciate psych input-now stable cleared by psych.  History of bipolar disorder: Continue Abilify.   History of CVA with right hemiparesis/facial droop: On anticoagulation  Peripheral neuropathy: Continues to have intermittent mostly left lower extremity pain-continue scheduled Neurontin-and as needed oxycodone for breakthrough pain.  DM-2 (A1c 6.0 on 9/17): CBG stable with SSI.  Will liberalize his diet and see what happens to his sugars.  Follow-up with  Recent Labs    08/26/22 1541 08/26/22 2150 08/27/22 0806  GLUCAP 102* 144* 151*    History of right AKA-left great toe amputation  COPD: Not in exacerbation-continue Brovana/Yupelri/budesonide- will convert to inhaler regimen when closer to discharge.  Polysubstance abuse-cocaine: Counseled  Homelessness: Very vague about his situation-claims he sleeps wherever he can-eats whatever he can get-claims he is on a wheelchair  (right AKA/right hemiparesis).  Social worker following.  BMI:  Estimated body mass index is 28.06 kg/m as calculated from the following:   Height as of this encounter: 5\' 9"  (1.753 m).   Weight as of this encounter: 86.2 kg.   Code status:   Code Status: Full Code   DVT Prophylaxis: apixaban (ELIQUIS) tablet 5 mg     Family Communication: None at bedside   Disposition Plan: Status is: Inpatient Remains inpatient appropriate because: Suicidal ideation-sitter-awaiting inpatient psych bed.   Planned Discharge Destination: Inpatient psych bed-otherwise will likely require SNF.   Diet: Diet Order             Diet Heart Room service appropriate? No; Fluid consistency: Thin  Diet effective now                    MEDICATIONS: Scheduled Meds:  apixaban  5 mg Oral BID   arformoterol  15 mcg Nebulization BID   ARIPiprazole  10 mg Oral Daily   budesonide (PULMICORT) nebulizer solution  0.25 mg Nebulization BID   carvedilol  3.125 mg Oral BID WC   escitalopram  5 mg Oral Daily   folic acid  1 mg Oral Daily   furosemide  40 mg Oral BID   gabapentin  400 mg Oral TID   insulin aspart  0-9 Units Subcutaneous TID WC   lisinopril  10 mg Oral Daily   revefenacin  175 mcg Nebulization Daily   thiamine  100 mg Oral Daily   Continuous Infusions: PRN Meds:.hydrALAZINE, oxyCODONE   I have personally reviewed following labs and imaging studies  LABORATORY DATA:  Recent Labs  Lab 08/24/22 0620  WBC 5.5  HGB 12.7*  HCT 38.7*  PLT 386  MCV 88.0  MCH 28.9  MCHC 32.8  RDW 17.2*    Recent Labs  Lab 08/20/22 1032 08/24/22 0620  NA  --  138  K  --  4.3  CL  --  101  CO2  --  23  GLUCOSE  --  97  BUN  --  15  CREATININE  --  0.86  CALCIUM  --  9.2  MG  --  2.0  PROCALCITON <0.10  --          RADIOLOGY STUDIES/RESULTS: No results found.   LOS: 10 days   Signature  08/26/22 M.D on 08/27/2022 at 10:28 AM   -  To page go to [email or web address]

## 2022-08-27 NOTE — Plan of Care (Signed)
  Problem: Education: Goal: Knowledge of General Education information will improve Description Including pain rating scale, medication(s)/side effects and non-pharmacologic comfort measures Outcome: Progressing   Problem: Health Behavior/Discharge Planning: Goal: Ability to manage health-related needs will improve Outcome: Progressing   

## 2022-08-27 NOTE — TOC Progression Note (Signed)
Transition of Care [facility]) - Progression Note    Patient Details  Name: Evian Salguero MRN: [id number] Date of Birth: [birth year]  Transition of [facility] CM/SW [place], RN Phone Number: 08/27/2022, 3:03 PM  Clinical Narrative:     Updated Mrs. Tim Lair with [facility] of social services.  [phone] Expected Discharge Plan: Home/Self Care Barriers to Discharge: Continued Medical Work up  Expected Discharge Plan and Services Expected Discharge Plan: Home/Self Care In-house Referral: Clinical Social Work     Living arrangements for the past 2 months: Homeless                                       Social Determinants of Health ([place]) Interventions    Readmission Risk Interventions    08/18/2022    9:00 AM 07/18/2022    4:11 PM 04/08/2022    1:43 PM  Readmission Risk Prevention Plan  Transportation Screening Complete Complete Complete  Medication Review Press photographer) Complete Complete Complete  PCP or Specialist appointment within 3-5 days of discharge Complete Complete Complete  HRI or Home Care Consult Complete Complete Not Complete  HRI or Home Care Consult Pt Refusal Comments   Pt is homeless  SW Recovery Care/Counseling Consult Complete Complete Complete  Palliative Care Screening Not Applicable  Not [place] Not Applicable Complete Not Applicable

## 2022-08-27 NOTE — Consult Note (Signed)
Brief Psychiatry Consult Note  Saw pt briefly in late afternoon as he benefits from frequent, short visits. States his medications are working well and he feels better (from a mental health perspective) today compared to yesterday. Allowed him to talk about heart healthy menu options - ordering the same thing every day so he can get something he knows he likes. He talks about recent efforts with PT/OT (working on getting sock on) and his concerns over his foot. He wishes this [place] a good trip (had informed him I will be leaving for vacation and someone else from psychiatry will see him for the next few days) and offers a fist bump before leaving. No SI, HI, AH/VH.   - psych to continue to follow - see last note for full assessment and plan   I personally spent 25 minutes on the unit in direct patient care. The direct patient care time included face-to-face time with the patient, reviewing the patient's chart, communicating with other professionals, and coordinating care. Greater than 50% of this time was spent in counseling or coordinating care with the patient regarding goals of hospitalization, psycho-education, and discharge planning needs.   Sofia Jaquith A Nannie Starzyk

## 2022-08-28 LAB — CBC WITH DIFFERENTIAL/PLATELET
Abs Immature Granulocytes: 0.01 10*3/uL (ref 0.00–0.07)
Basophils Absolute: 0.1 10*3/uL (ref 0.0–0.1)
Basophils Relative: 1 %
Eosinophils Absolute: 0.1 10*3/uL (ref 0.0–0.5)
Eosinophils Relative: 2 %
HCT: 38.7 % — ABNORMAL LOW (ref 39.0–52.0)
Hemoglobin: 12.9 g/dL — ABNORMAL LOW (ref 13.0–17.0)
Immature Granulocytes: 0 %
Lymphocytes Relative: 41 %
Lymphs Abs: 2.4 10*3/uL (ref 0.7–4.0)
MCH: 29 pg (ref 26.0–34.0)
MCHC: 33.3 g/dL (ref 30.0–36.0)
MCV: 87 fL (ref 80.0–100.0)
Monocytes Absolute: 0.8 10*3/uL (ref 0.1–1.0)
Monocytes Relative: 13 %
Neutro Abs: 2.5 10*3/uL (ref 1.7–7.7)
Neutrophils Relative %: 43 %
Platelets: 509 10*3/uL — ABNORMAL HIGH (ref 150–400)
RBC: 4.45 MIL/uL (ref 4.22–5.81)
RDW: 16.7 % — ABNORMAL HIGH (ref 11.5–15.5)
WBC: 5.8 10*3/uL (ref 4.0–10.5)
nRBC: 0 % (ref 0.0–0.2)

## 2022-08-28 LAB — BASIC METABOLIC PANEL
Anion gap: 15 (ref 5–15)
BUN: 22 mg/dL — ABNORMAL HIGH (ref 6–20)
CO2: 24 mmol/L (ref 22–32)
Calcium: 9.9 mg/dL (ref 8.9–10.3)
Chloride: 98 mmol/L (ref 98–111)
Creatinine, Ser: 0.98 mg/dL (ref 0.61–1.24)
GFR, Estimated: >60 mL/min (ref >60)
Glucose, Bld: 95 mg/dL (ref 70–99)
Potassium: 4.3 mmol/L (ref 3.5–5.1)
Sodium: 137 mmol/L (ref 135–145)

## 2022-08-28 LAB — GLUCOSE, CAPILLARY
Glucose-Capillary: 214 mg/dL — ABNORMAL HIGH (ref 70–99)
Glucose-Capillary: 76 mg/dL (ref 70–99)
Glucose-Capillary: 115 mg/dL — ABNORMAL HIGH (ref 70–99)
Glucose-Capillary: 144 mg/dL — ABNORMAL HIGH (ref 70–99)

## 2022-08-28 LAB — MAGNESIUM: Magnesium: 2.1 mg/dL (ref 1.7–2.4)

## 2022-08-28 NOTE — Progress Notes (Signed)
PT Cancellation Note  Patient Details Name: [name] MRN: [id number] DOB: 30-Aug-1966   Cancelled Treatment:    Reason Eval/Treat Not Completed: (P) Patient's level of consciousness (pt sleeping) Not easily awoken. Will continue efforts per PT plan of care next date.   [name] 08/28/2022, 6:09 PM

## 2022-08-28 NOTE — Progress Notes (Signed)
PROGRESS NOTE        PATIENT DETAILS Name: [name] Age: 56 y.o. Sex: male Date of Birth: 07/02/1966 Admit Date: 08/16/2022 Admitting Physician Clance Boll, MD [id number], [name], NP  Brief Summary: Patient is a 56 y.o.  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  He was subsequently started on anticoagulation and admitted to the hospitalist service.  Further hospital course was complicated by patient having suicidal ideation-psychiatry was consulted-bedside sitter was placed-with recommendations to pursue inpatient psychiatric admission.  See below for further details.  Significant events: 5/4-5/12>> hospitalization for sepsis from cellulitis from chronic lower extremity wound. 8/12-8/25>> hospitalization for HFrEF exacerbation/hypoxia 9/16>> admit to TRH-pleuritic chest pain/shortness of breath-CTA with bilateral PE and pulmonary infarct.  Significant studies: 9/16>> CTA chest: Bilateral PE-right lung infarct. 9/16>> bilateral lower extremity angiogram: No major stenosis. 9/17>> x-ray left ankle: No fracture/dislocation. 9/17>> UDS: Positive for opiates/cocaine 9/18>> lower extremity Doppler: No DVT 9/18>> Limited echo: JJ<88%, RV systolic function moderately reduced. 9/20>> CXR: Slight worsening of peripheral density in the right mid/lower lung.  Significant microbiology data: None  Procedures: None  Consults: Psychiatry  Subjective:  Patient in bed, appears comfortable, denies any headache, no fever, no chest pain or pressure, no shortness of breath , no abdominal pain. No new focal weakness.    Objective: Vitals: Blood pressure 139/89, pulse  74, temperature 98 F (36.7 C), temperature source Oral, resp. rate 20, height 5\' 9"  (1.753 m), weight 86.2 kg, SpO2 98 %.   Exam:  Awake Alert, No new F.N deficits, Normal affect [place].AT,PERRAL Supple Neck, No JVD,   Symmetrical Chest wall movement, Good air movement bilaterally, CTAB RRR,No Gallops, Rubs or new Murmurs,  +ve B.Sounds, Abd Soft, No tenderness,     Right AKA-left great toe amputation.    Assessment/Plan:  Pulmonary embolism with right lung infarct: Pleuritic pain improving-continue Eliquis.  Etiology of VTE likely due to noncompliance.  Given that he has had VTE in the past-needs indefinite anticoagulation.   Chronic HFpEF/HFrEF: Euvolemic-continue Lasix/Coreg/lisinopril.  If compliant with this regimen-could be switched to Entresto at some point.  Follow with cardiology in the outpatient setting.   Cough: Suspect from GERD/lung infarct-although at risk for aspiration-do not think he has pneumonia-he may have chemical pneumonitis-Augmentin discontinued on 9/21.  Suicidal ideation: Appreciate psych input-now stable cleared by psych.  History of bipolar disorder: Continue Abilify.   History of CVA with right hemiparesis/facial droop: On anticoagulation  Peripheral neuropathy: Continues to have intermittent mostly left lower extremity pain-continue scheduled Neurontin-and as needed oxycodone for breakthrough pain.  DM-2 (A1c 6.0 on 9/17): CBG stable with SSI.  Will liberalize his diet and see what happens to his sugars.  Follow-up closely as he is noncompliant with diet and now does not want carb modified diet anymore.  May require addition of long-acting insulin.  Recent Labs    08/27/22 1610 08/27/22 2100 08/28/22 0823  GLUCAP 116* 101* 214*    History of right AKA-left great toe amputation  COPD: Not in exacerbation-continue Brovana/Yupelri/budesonide- will convert to inhaler regimen when closer to discharge.  Polysubstance abuse-cocaine:  Counseled  Homelessness: Very vague about  his situation-claims he sleeps wherever he can-eats whatever he can get-claims he is on a wheelchair (right AKA/right hemiparesis).  Social worker following.  BMI: Estimated body mass index is 28.06 kg/m as calculated from the following:   Height as of this encounter: 5\' 9"  (1.753 m).   Weight as of this encounter: 86.2 kg.   Code status:   Code Status: Full Code   DVT Prophylaxis: apixaban (ELIQUIS) tablet 5 mg     Family Communication: None at bedside   Disposition Plan: Status is: Inpatient Medically stable await safe disposition, TOC working on it.  Diet: Diet Order             Diet Heart Room service appropriate? No; Fluid consistency: Thin  Diet effective now                    MEDICATIONS: Scheduled Meds:  apixaban  5 mg Oral BID   arformoterol  15 mcg Nebulization BID   ARIPiprazole  10 mg Oral Daily   budesonide (PULMICORT) nebulizer solution  0.25 mg Nebulization BID   carvedilol  3.125 mg Oral BID WC   escitalopram  5 mg Oral Daily   folic acid  1 mg Oral Daily   furosemide  40 mg Oral BID   gabapentin  400 mg Oral TID   insulin aspart  0-9 Units Subcutaneous TID WC   lisinopril  10 mg Oral Daily   revefenacin  175 mcg Nebulization Daily   thiamine  100 mg Oral Daily   Continuous Infusions: PRN Meds:.hydrALAZINE, oxyCODONE   I have personally reviewed following labs and imaging studies  LABORATORY DATA:  Recent Labs  Lab 08/24/22 0620 08/28/22 0458  WBC 5.5 5.8  HGB 12.7* 12.9*  HCT 38.7* 38.7*  PLT 386 509*  MCV 88.0 87.0  MCH 28.9 29.0  MCHC 32.8 33.3  RDW 17.2* 16.7*  LYMPHSABS  --  2.4  MONOABS  --  0.8  EOSABS  --  0.1  BASOSABS  --  0.1    Recent Labs  Lab 08/24/22 0620 08/28/22 0458  NA 138 137  K 4.3 4.3  CL 101 98  CO2 23 24  GLUCOSE 97 95  BUN 15 22*  CREATININE 0.86 0.98  CALCIUM 9.2 9.9  MG 2.0 2.1    RADIOLOGY STUDIES/RESULTS: No results found.   LOS: 11  days   Signature  08/30/22 M.D on 08/28/2022 at 9:02 AM   -  To page go to [email or web address]

## 2022-08-28 NOTE — TOC Progression Note (Signed)
Transition of Care [facility]) - Progression Note    Patient Details  Name: [name] MRN: [id number] Date of Birth: May 30, 1966  Transition of Care [facility]) CM/SW [place], [name] Phone Number: 08/28/2022, 4:04 PM  Clinical Narrative:    Patient continues with no SNF bed offers. Leadership aware.    Expected Discharge Plan: Home/Self Care Barriers to Discharge: Continued Medical Work up  Expected Discharge Plan and Services Expected Discharge Plan: Home/Self Care In-house Referral: Clinical Social Work     Living arrangements for the past 2 months: Homeless                                       Social Determinants of Health ([place]) Interventions    Readmission Risk Interventions    08/18/2022    9:00 AM 07/18/2022    4:11 PM 04/08/2022    1:43 PM  Readmission Risk Prevention Plan  Transportation Screening Complete Complete Complete  Medication Review Press photographer) Complete Complete Complete  PCP or Specialist appointment within 3-5 days of discharge Complete Complete Complete  HRI or Home Care Consult Complete Complete Not Complete  HRI or Home Care Consult Pt Refusal Comments   Pt is homeless  SW Recovery Care/Counseling Consult Complete Complete Complete  Palliative Care Screening Not Applicable  Not [place] Not Applicable Complete Not Applicable

## 2022-08-29 LAB — GLUCOSE, CAPILLARY
Glucose-Capillary: 154 mg/dL — ABNORMAL HIGH (ref 70–99)
Glucose-Capillary: 79 mg/dL (ref 70–99)
Glucose-Capillary: 100 mg/dL — ABNORMAL HIGH (ref 70–99)
Glucose-Capillary: 119 mg/dL — ABNORMAL HIGH (ref 70–99)
Glucose-Capillary: 111 mg/dL — ABNORMAL HIGH (ref 70–99)
Glucose-Capillary: 117 mg/dL — ABNORMAL HIGH (ref 70–99)

## 2022-08-29 NOTE — Consult Note (Cosign Needed Addendum)
[facility] Consult   Reason for Consult:  [name]  Referring Physician:  Francee Nodal Patient Identification: [name] MRN:  [id number] Principal Diagnosis: Pulmonary emboli [facility]) Diagnosis:  Principal Problem:   Pulmonary emboli ([place]) Active Problems:   Cocaine use disorder, severe, dependence ([place])   Cocaine abuse with cocaine-induced mood disorder ([place])   Homeless   Suicidal ideation   Essential hypertension   Bipolar I disorder ([place])   Chronic combined systolic and diastolic congestive heart failure ([place])   Total Time spent with patient: 15 minutes  Subjective:   [name] is a 56 y.o. male seen and evaluated face-to-face by this provider.  Consult placed due to suicidal ideations.  He presents with a bright and pleasant affect.  Denying suicidal or homicidal ideations.  Denies auditory visual hallucinations.  Patient reports his main focus is getting his own personal wheelchair and possibly housing.  Reports "I thought somebody was looking for nursing home for me?"   Walker has requested to discharge to [facility] today.  States "feeling better overall since his admission."  Chart review patient currently prescribed Abilify 10 mg, gabapentin 400 mg p.o. 3 times daily Lexapro 5 mg daily which she reports taking and tolerating well.  Denying any medication side effects i.e. headaches nausea vomiting or dizziness. Chart review patient was psychiatrically cleared 08/25/2022.  Psychiatry continue to follow for medication management and therapy services.  HPI:  Per admission assessment note: "Patient is a 56 y.o.  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  He was  subsequently started on anticoagulation and admitted to the hospitalist service.  Further hospital course was complicated by patient having suicidal ideation-psychiatry was consulted-bedside sitter was placed-with recommendations to pursue inpatient psychiatric admission.  See below for further details.  Past Psychiatric History:   Risk to Self:   Risk to Others:   Prior Inpatient Therapy:   Prior Outpatient Therapy:    Past Medical History:  Past Medical History:  Diagnosis Date   Asthma    Bipolar 1 disorder ([place])    CHF (congestive heart failure) ([place])    COPD (chronic obstructive pulmonary disease) ([place])    Depression    Diabetes mellitus without complication ([place])    GERD (gastroesophageal reflux disease)    Homelessness    Hypertension    QT prolongation    Schizoaffective disorder ([place])     Past Surgical History:  Procedure Laterality Date   AMPUTATION TOE Bilateral 01/02/2021   Procedure: AMPUTATION TOE;  Surgeon: Evelina Bucy, DPM;  Location: [facility];  Service: Podiatry;  Laterality: Bilateral;  Ok to leave patient on hospital bed   IR ANGIO INTRA EXTRACRAN SEL COM CAROTID INNOMINATE BILAT MOD SED  12/17/2020   IR ANGIO VERTEBRAL SEL VERTEBRAL BILAT MOD SED  12/17/2020   RADIOLOGY WITH ANESTHESIA N/A 12/17/2020   Procedure: IR WITH ANESTHESIA;  Surgeon: Radiologist, Medication, MD;  Location: [facility];  Service: Radiology;  Laterality: N/A;   Family History:  Family History  Problem Relation Age of Onset   Drug abuse Mother    Suicidality Mother    Family Psychiatric  History:  Social History:  Social History   Substance and Sexual Activity  Alcohol Use Yes     Social History  Substance and Sexual Activity  Drug Use Yes   Types: Cocaine    Social History   Socioeconomic History   Marital status: Single    Spouse name: Not on file   Number of children: 4   Years of education: Not on file   Highest education level: Not on file  Occupational History    Occupation: N/A  Tobacco Use   Smoking status: Some Days    Years: 30.00    Types: Cigarettes   Smokeless tobacco: Never  Vaping Use   Vaping Use: Never used  Substance and Sexual Activity   Alcohol use: Yes   Drug use: Yes    Types: Cocaine   Sexual activity: Not Currently    Partners: Female    Birth control/protection: None  Other Topics Concern   Not on file  Social History Narrative   ** Merged History Encounter **       Social Determinants of Health   Financial Resource Strain: Not on file  Food Insecurity: Not on file  Transportation Needs: Not on file  Physical Activity: Not on file  Stress: Not on file  Social Connections: Not on file   Additional Social History:    Allergies:   Allergies  Allergen Reactions   Motrin [Ibuprofen] Anaphylaxis   Tylenol [Acetaminophen] Hives, Nausea And Vomiting and Rash   Lyrica Cr [Pregabalin Er] Other (See Comments)    Dystonia Dysarthria    Peanut-Containing Drug Products Other (See Comments)    Pt denies allergy to peanut butter 03/02/2021    Labs:  Results for orders placed or performed during the hospital encounter of 08/16/22 (from the past 48 hour(s))  Glucose, capillary     Status: None   Collection Time: 08/27/22 12:09 PM  Result Value Ref Range   Glucose-Capillary 86 70 - 99 mg/dL    Comment: Glucose reference range applies only to samples taken after fasting for at least 8 hours.  Glucose, capillary     Status: Abnormal   Collection Time: 08/27/22  4:10 PM  Result Value Ref Range   Glucose-Capillary 116 (H) 70 - 99 mg/dL    Comment: Glucose reference range applies only to samples taken after fasting for at least 8 hours.  Glucose, capillary     Status: Abnormal   Collection Time: 08/27/22  9:00 PM  Result Value Ref Range   Glucose-Capillary 101 (H) 70 - 99 mg/dL    Comment: Glucose reference range applies only to samples taken after fasting for at least 8 hours.  Magnesium     Status: None   Collection  Time: 08/28/22  4:58 AM  Result Value Ref Range   Magnesium 2.1 1.7 - 2.4 mg/dL    Comment: Performed at [facility] [address] [place], [place] [id number]  Basic metabolic panel     Status: Abnormal   Collection Time: 08/28/22  4:58 AM  Result Value Ref Range   Sodium 137 135 - 145 mmol/L   Potassium 4.3 3.5 - 5.1 mmol/L   Chloride 98 98 - 111 mmol/L   CO2 24 22 - 32 mmol/L   Glucose, Bld 95 70 - 99 mg/dL    Comment: Glucose reference range applies only to samples taken after fasting for at least 8 hours.   BUN 22 (H) 6 - 20 mg/dL   Creatinine, Ser 0.98 0.61 - 1.24 mg/dL   Calcium 9.9 8.9 - 10.3 mg/dL   GFR, Estimated >60 >60 mL/min  Comment: (NOTE) Calculated using the CKD-EPI Creatinine Equation (2021)    Anion gap 15 5 - 15    Comment: Performed at [facility], [place] [address], [place], [place] [address]  CBC with Differential/Platelet     Status: Abnormal   Collection Time: 08/28/22  4:58 AM  Result Value Ref Range   WBC 5.8 4.0 - 10.5 K/uL   RBC 4.45 4.22 - 5.81 MIL/uL   Hemoglobin 12.9 (L) 13.0 - 17.0 g/dL   HCT 38.7 (L) 39.0 - 52.0 %   MCV 87.0 80.0 - 100.0 fL   MCH 29.0 26.0 - 34.0 pg   MCHC 33.3 30.0 - 36.0 g/dL   RDW 16.7 (H) 11.5 - 15.5 %   Platelets 509 (H) 150 - 400 K/uL   nRBC 0.0 0.0 - 0.2 %   Neutrophils Relative % 43 %   Neutro Abs 2.5 1.7 - 7.7 K/uL   Lymphocytes Relative 41 %   Lymphs Abs 2.4 0.7 - 4.0 K/uL   Monocytes Relative 13 %   Monocytes Absolute 0.8 0.1 - 1.0 K/uL   Eosinophils Relative 2 %   Eosinophils Absolute 0.1 0.0 - 0.5 K/uL   Basophils Relative 1 %   Basophils Absolute 0.1 0.0 - 0.1 K/uL   Immature Granulocytes 0 %   Abs Immature Granulocytes 0.01 0.00 - 0.07 K/uL    Comment: Performed at [facility] [address] [place], [place] [address]  Glucose, capillary     Status: Abnormal   Collection Time: 08/28/22  8:23 AM  Result Value Ref Range   Glucose-Capillary 214 (H) 70 - 99 mg/dL    Comment:  Glucose reference range applies only to samples taken after fasting for at least 8 hours.  Glucose, capillary     Status: None   Collection Time: 08/28/22 11:11 AM  Result Value Ref Range   Glucose-Capillary 76 70 - 99 mg/dL    Comment: Glucose reference range applies only to samples taken after fasting for at least 8 hours.  Glucose, capillary     Status: Abnormal   Collection Time: 08/28/22  4:12 PM  Result Value Ref Range   Glucose-Capillary 115 (H) 70 - 99 mg/dL    Comment: Glucose reference range applies only to samples taken after fasting for at least 8 hours.  Glucose, capillary     Status: Abnormal   Collection Time: 08/28/22  9:07 PM  Result Value Ref Range   Glucose-Capillary 144 (H) 70 - 99 mg/dL    Comment: Glucose reference range applies only to samples taken after fasting for at least 8 hours.  Glucose, capillary     Status: Abnormal   Collection Time: 08/29/22  7:59 AM  Result Value Ref Range   Glucose-Capillary 154 (H) 70 - 99 mg/dL    Comment: Glucose reference range applies only to samples taken after fasting for at least 8 hours.    Current Facility-Administered Medications  Medication Dose Route Frequency Provider Last Rate Last Admin   apixaban (ELIQUIS) tablet 5 mg  5 mg Oral BID Bell, Lorin C, RPH   5 mg at 08/29/22 0914   arformoterol (BROVANA) nebulizer solution 15 mcg  15 mcg Nebulization BID Jonetta Osgood, MD   15 mcg at 08/29/22 0817   ARIPiprazole (ABILIFY) tablet 10 mg  10 mg Oral Daily Rosezetta Schlatter, MD   10 mg at 08/29/22 0917   budesonide (PULMICORT) nebulizer solution 0.25 mg  0.25 mg Nebulization BID Ghimire, [employer]  M, MD   0.25 mg at 08/29/22 0817   carvedilol (COREG) tablet 3.125 mg  3.125 mg Oral BID WC Myles Rosenthal A, MD   3.125 mg at 08/29/22 0916   escitalopram (LEXAPRO) tablet 5 mg  5 mg Oral Daily Cinderella, Margaret A   5 mg at [id number]   folic acid (FOLVITE) tablet 1 mg  1 mg Oral Daily Myles Rosenthal A, MD   1 mg at  08/29/22 [id number]   furosemide (LASIX) tablet 40 mg  40 mg Oral BID Myles Rosenthal A, MD   40 mg at 08/29/22 0914   gabapentin (NEURONTIN) capsule 400 mg  400 mg Oral TID Clance Boll, MD   400 mg at 08/29/22 [id number]   hydrALAZINE (APRESOLINE) tablet 25 mg  25 mg Oral Q6H PRN [name] Cheney, MD       insulin aspart (novoLOG) injection 0-9 Units  0-9 Units Subcutaneous TID WC Clance Boll, MD   2 Units at 08/29/22 0916   lisinopril (ZESTRIL) tablet 10 mg  10 mg Oral Daily [name] Cheney, MD   10 mg at 08/29/22 [id number]   oxyCODONE (Oxy IR/ROXICODONE) immediate release tablet 5 mg  5 mg Oral Q6H PRN Jonetta Osgood, MD   5 mg at 08/28/22 1018   revefenacin (YUPELRI) nebulizer solution 175 mcg  175 mcg Nebulization Daily Jonetta Osgood, MD   175 mcg at 08/29/22 0816   thiamine (VITAMIN B1) tablet 100 mg  100 mg Oral Daily Clance Boll, MD   100 mg at 08/29/22 [id number]    Musculoskeletal:   Psychiatric Specialty Exam:  Presentation  General Appearance: Appropriate for Environment  Eye Contact:Good  Speech:Garbled  Speech Volume:Normal  Handedness:Right   Mood and Affect  Mood:-- (so so)  Affect:Full Range   Thought Process  Thought Processes:Coherent; Goal Directed; Linear  Descriptions of Associations:Intact  Orientation:Full (Time, Place and Person)  Thought Content:-- (no paranoia today)  History of Schizophrenia/Schizoaffective disorder:Yes  Duration of Psychotic Symptoms:Greater than six months  Hallucinations:No data recorded Ideas of Reference:None  Suicidal Thoughts:No data recorded Homicidal Thoughts:No data recorded  Sensorium  Memory:Immediate Good; Recent Fair; Remote Good  Judgment:Poor  Insight:Fair   Executive Functions  Concentration:Fair  Attention Span:Fair  [place]   Psychomotor Activity  Psychomotor Activity:No data recorded  Assets  Assets:Communication Skills; Desire  for Improvement; Financial Resources/Insurance   Sleep  Sleep:No data recorded  Physical Exam: Physical Exam Vitals and nursing note reviewed.  Neurological:     Mental Status: He is alert and oriented to person, place, and time.  Psychiatric:        Mood and Affect: Mood normal.        Behavior: Behavior normal.    Review of Systems  Psychiatric/Behavioral:  Positive for depression. Negative for suicidal ideas. The patient is nervous/anxious.   All other systems reviewed and are negative.  Blood pressure 119/61, pulse 77, temperature 97.9 F (36.6 C), temperature source Oral, resp. rate 18, height 5\' 9"  (1.753 m), weight 86.2 kg, SpO2 99 %. Body mass index is 28.06 kg/m.  Treatment Plan Summary: Daily contact with patient to assess and evaluate symptoms and progress in treatment and Medication management  Continue Abilify 10 mg daily.   Continue Lexapro 5 mg daily Continue gabapentin 400 mg p.o. 3 times daily  TOC consult placed with follow-up with outpatient resources.  Discussed following up with [facility] urgent care walk-in clinic for medication management at discharge  Disposition: No evidence of imminent risk to self or others at present.   Patient does not meet criteria for psychiatric inpatient admission- he was cleared on 9/25.  Supportive therapy provided about ongoing stressors. Refer to IOP. Discussed crisis plan, support from social network, calling 911, coming to the Emergency Department, and calling Suicide Hotline.  Derrill [place], NP 08/29/2022 11:18 AM

## 2022-08-29 NOTE — Plan of Care (Signed)

## 2022-08-29 NOTE — Progress Notes (Signed)
Physical Therapy Treatment Patient Details Name: [name] MRN: [id number] DOB: 1966-08-31 Today's Date: 08/29/2022   History of Present Illness Pt is a 56yo M who presented 9/16 with c/o chest pain, SOB, and leg swelling. Found to have B PE. PMH includes asthma, bioplar 1, depression, schizoaffective disorder, CVA, chronic CHF, homelessness, HTN, DM2, cocaine and cannabis use and R AKA.    PT Comments    Pt maneuvering w/c well today, PT challenging pt with weaving and hallway navigation. Pt continues to require safety cues during mobility, and does require steadying assist during pivot to/from w/c today given posterior unsteadiness. PT continuing to recommend SNF, will continue to follow.      Recommendations for follow up therapy are one component of a multi-disciplinary discharge planning process, led by the attending physician.  Recommendations may be updated based on patient status, additional functional criteria and insurance authorization.  Follow Up Recommendations  Skilled nursing-short term rehab (<3 hours/day) Can patient physically be transported by private vehicle: Yes   Assistance Recommended at Discharge Set up Supervision/Assistance  Patient can return home with the following A little help with walking and/or transfers;A little help with bathing/dressing/bathroom   Equipment Recommendations  Wheelchair (measurements PT);Wheelchair cushion (measurements PT) (pt requesting power w/c)    Recommendations for Other Services       Precautions / Restrictions Precautions Precautions: Fall Restrictions Weight Bearing Restrictions: No     Mobility  Bed Mobility Overal bed mobility: Needs Assistance Bed Mobility: Supine to Sit, Sit to Supine     Supine to sit: Supervision Sit to supine: Supervision        Transfers Overall transfer level: Needs assistance Equipment used: 1 person hand held assist Transfers: Bed to chair/wheelchair/BSC   Stand pivot  transfers: Min assist         General transfer comment: light pt and w/c steadying, cues for w/c locking    Ambulation/Gait                   Theme park manager mobility: Yes Wheelchair propulsion: Both upper extremities Wheelchair parts: Supervision/cueing Distance: 250 Wheelchair Assistance Details (indicate cue type and reason): cues for technique, challenged pt maneuvering with weaving task  Modified Rankin (Stroke Patients Only)       Balance Overall balance assessment: Needs assistance Sitting-balance support: No upper extremity supported Sitting balance-Leahy Scale: Good     Standing balance support: During functional activity, Bilateral upper extremity supported Standing balance-Leahy Scale: Poor                              Cognition Arousal/Alertness: Awake/alert Behavior During Therapy: WFL for tasks assessed/performed Overall Cognitive Status: No family/caregiver present to determine baseline cognitive functioning Area of Impairment: Attention, Memory, Following commands, Safety/judgement, Awareness, Problem solving                   Current Attention Level: Selective Memory: Decreased short-term memory Following Commands: Follows one step commands with increased time, Follows multi-step commands inconsistently Safety/Judgement: Decreased awareness of safety Awareness: Emergent Problem Solving: Slow processing, Difficulty sequencing, Requires verbal cues, Requires tactile cues General Comments: pt asking about d/c planning multiple times during session        Exercises      General Comments        Pertinent Vitals/Pain Pain Assessment Pain Assessment: Faces Faces  Pain Scale: Hurts a little bit Pain Location: L foot/ankle Pain Descriptors / Indicators: Sore Pain Intervention(s): Limited activity within patient's tolerance, Monitored during session    Home  Living                          Prior Function            PT Goals (current goals can now be found in the care plan section) Acute Rehab PT Goals Patient Stated Goal: get a power chair PT Goal Formulation: With patient Time For Goal Achievement: 09/04/22 Potential to Achieve Goals: Good Progress towards PT goals: Progressing toward goals    Frequency    Min 2X/week      PT Plan Current plan remains appropriate    Co-evaluation              AM-PAC PT "6 Clicks" Mobility   Outcome Measure  Help needed turning from your back to your side while in a flat bed without using bedrails?: None Help needed moving from lying on your back to sitting on the side of a flat bed without using bedrails?: A Little Help needed moving to and from a bed to a chair (including a wheelchair)?: A Little Help needed standing up from a chair using your arms (e.g., wheelchair or bedside chair)?: A Little Help needed to walk in hospital room?: A Lot Help needed climbing 3-5 steps with a railing? : A Lot 6 Click Score: 17    End of Session   Activity Tolerance: Patient tolerated treatment well Patient left: in bed;with call bell/phone within reach;with nursing/sitter in room;with bed alarm set Nurse Communication: Mobility status PT Visit Diagnosis: Other abnormalities of gait and mobility (R26.89)     Time: 1660-6301 PT Time Calculation (min) (ACUTE ONLY): 14 min  Charges:  $Wheel Chair Management: 8-22 mins                     Marye Round, PT DPT Acute Rehabilitation Services Pager [phone]  Office [phone]   [name] 08/29/2022, 4:27 PM

## 2022-08-29 NOTE — Progress Notes (Signed)
PROGRESS NOTE        PATIENT DETAILS Name: [name] Age: 56 y.o. Sex: male Date of Birth: 07-Jun-1966 Admit Date: 08/16/2022 Admitting Physician [name] Boll, MD [id number], [name] Muscat, NP  Brief Summary: Patient is a 56 y.o.  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  He was subsequently started on anticoagulation and admitted to the hospitalist service.  Further hospital course was complicated by patient having suicidal ideation-psychiatry was consulted-bedside sitter was placed-with recommendations to pursue inpatient psychiatric admission.  See below for further details.  Significant events: 5/4-5/12>> hospitalization for sepsis from cellulitis from chronic lower extremity wound. 8/12-8/25>> hospitalization for HFrEF exacerbation/hypoxia 9/16>> admit to [facility]-pleuritic chest pain/shortness of breath-CTA with bilateral PE and pulmonary infarct.  Significant studies: 9/16>> CTA chest: Bilateral PE-right lung infarct. 9/16>> bilateral lower extremity angiogram: No major stenosis. 9/17>> x-ray left ankle: No fracture/dislocation. 9/17>> UDS: Positive for opiates/cocaine 9/18>> lower extremity Doppler: No DVT 9/18>> Limited echo: [name]<93%, RV systolic function moderately reduced. 9/20>> CXR: Slight worsening of peripheral density in the right mid/lower lung.  Significant microbiology data: None  Procedures: None  Consults: Psychiatry  Subjective:  Patient in bed, appears comfortable, denies any headache, no fever, no chest pain or pressure, no shortness of breath , no abdominal pain. No new focal weakness.   Objective: Vitals: Blood pressure 119/61, pulse  77, temperature 97.9 F (36.6 C), temperature source Oral, resp. rate 18, height 5\' 9"  (1.753 m), weight 86.2 kg, SpO2 99 %.   Exam:  Awake Alert, No new F.N deficits, Normal affect [name].AT,PERRAL Supple Neck, No JVD,   Symmetrical Chest wall movement, Good air movement bilaterally, CTAB RRR,No Gallops, Rubs or new Murmurs,  +ve B.Sounds, Abd Soft, No tenderness,      Right AKA-left great toe amputation.    Assessment/Plan:  Pulmonary embolism with right lung infarct: Pleuritic pain improving-continue Eliquis.  Etiology of VTE likely due to noncompliance.  Given that he has had VTE in the past-needs indefinite anticoagulation.   Chronic HFpEF/HFrEF: Euvolemic-continue Lasix/Coreg/lisinopril.  If compliant with this regimen-could be switched to Entresto at some point.  Follow with cardiology in the outpatient setting.   Cough: Suspect from GERD/lung infarct-although at risk for aspiration-do not think he has pneumonia-he may have chemical pneumonitis-Augmentin discontinued on 9/21.  Suicidal ideation: Appreciate psych input-now stable cleared by psych.  History of bipolar disorder: Continue Abilify.   History of CVA with right hemiparesis/facial droop: On anticoagulation  Peripheral neuropathy: Continues to have intermittent mostly left lower extremity pain-continue scheduled Neurontin-and as needed oxycodone for breakthrough pain.  DM-2 (A1c 6.0 on 9/17): CBG stable with SSI.  Will liberalize his diet and see what happens to his sugars.  Follow-up closely as he is noncompliant with diet and now does not want carb modified diet anymore.  May require addition of long-acting insulin.  Recent Labs    08/28/22 1612 08/28/22 2107 08/29/22 0759  GLUCAP 115* 144* 154*    History of right AKA-left great toe amputation  COPD: Not in exacerbation-continue Brovana/Yupelri/budesonide- will convert to inhaler regimen when closer to discharge.  Polysubstance abuse-cocaine:  Counseled  Homelessness: Very vague about  his situation-claims he sleeps wherever he can-eats whatever he can get-claims he is on a wheelchair (right AKA/right hemiparesis).  Social worker following.  BMI: Estimated body mass index is 28.06 kg/m as calculated from the following:   Height as of this encounter: 5\' 9"  (1.753 m).   Weight as of this encounter: 86.2 kg.   Code status:   Code Status: Full Code   DVT Prophylaxis: apixaban (ELIQUIS) tablet 5 mg     Family Communication: None at bedside   Disposition Plan: Status is: Inpatient Medically stable await safe disposition, TOC working on it.  Diet: Diet Order             Diet regular Room service appropriate? Yes; Fluid consistency: Thin  Diet effective now                    MEDICATIONS: Scheduled Meds:  apixaban  5 mg Oral BID   arformoterol  15 mcg Nebulization BID   ARIPiprazole  10 mg Oral Daily   budesonide (PULMICORT) nebulizer solution  0.25 mg Nebulization BID   carvedilol  3.125 mg Oral BID WC   escitalopram  5 mg Oral Daily   folic acid  1 mg Oral Daily   furosemide  40 mg Oral BID   gabapentin  400 mg Oral TID   insulin aspart  0-9 Units Subcutaneous TID WC   lisinopril  10 mg Oral Daily   revefenacin  175 mcg Nebulization Daily   thiamine  100 mg Oral Daily   Continuous Infusions: PRN Meds:.hydrALAZINE, oxyCODONE   I have personally reviewed following labs and imaging studies  LABORATORY DATA:  Recent Labs  Lab 08/24/22 0620 08/28/22 0458  WBC 5.5 5.8  HGB 12.7* 12.9*  HCT 38.7* 38.7*  PLT 386 509*  MCV 88.0 87.0  MCH 28.9 29.0  MCHC 32.8 33.3  RDW 17.2* 16.7*  LYMPHSABS  --  2.4  MONOABS  --  0.8  EOSABS  --  0.1  BASOSABS  --  0.1    Recent Labs  Lab 08/24/22 0620 08/28/22 0458  NA 138 137  K 4.3 4.3  CL 101 98  CO2 23 24  GLUCOSE 97 95  BUN 15 22*  CREATININE 0.86 0.98  CALCIUM 9.2 9.9  MG 2.0 2.1    RADIOLOGY STUDIES/RESULTS: No results found.   LOS:  12 days   Signature  08/30/22 M.D on 08/29/2022 at 10:45 AM   -  To page go to [email or web address]

## 2022-08-30 LAB — GLUCOSE, CAPILLARY
Glucose-Capillary: 135 mg/dL — ABNORMAL HIGH (ref 70–99)
Glucose-Capillary: 133 mg/dL — ABNORMAL HIGH (ref 70–99)
Glucose-Capillary: 113 mg/dL — ABNORMAL HIGH (ref 70–99)
Glucose-Capillary: 106 mg/dL — ABNORMAL HIGH (ref 70–99)
Glucose-Capillary: 110 mg/dL — ABNORMAL HIGH (ref 70–99)

## 2022-08-30 NOTE — Plan of Care (Signed)

## 2022-08-30 NOTE — Progress Notes (Signed)
PROGRESS NOTE        PATIENT DETAILS Name: [name] Age: 56 y.o. Sex: male Date of Birth: 11-09-1966 Admit Date: 08/16/2022 Admitting Physician Clance Boll, MD [id number], [name], NP  Brief Summary: Patient is a 56 y.o.  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  He was subsequently started on anticoagulation and admitted to the hospitalist service.  Further hospital course was complicated by patient having suicidal ideation-psychiatry was consulted-bedside sitter was placed-with recommendations to pursue inpatient psychiatric admission.  See below for further details.  Significant events: 5/4-5/12>> hospitalization for sepsis from cellulitis from chronic lower extremity wound. 8/12-8/25>> hospitalization for HFrEF exacerbation/hypoxia 9/16>> admit to [facility]-pleuritic chest pain/shortness of breath-CTA with bilateral PE and pulmonary infarct.  Significant studies: 9/16>> CTA chest: Bilateral PE-right lung infarct. 9/16>> bilateral lower extremity angiogram: No major stenosis. 9/17>> x-ray left ankle: No fracture/dislocation. 9/17>> UDS: Positive for opiates/cocaine 9/18>> lower extremity Doppler: No DVT 9/18>> Limited echo: HK<74%, RV systolic function moderately reduced. 9/20>> CXR: Slight worsening of peripheral density in the right mid/lower lung.  Significant microbiology data: None  Procedures:  None  Consults:  Psychiatry  Subjective:  Patient in bed, appears comfortable, denies any headache, no fever, no chest pain or pressure, no shortness of breath , no abdominal pain. No new focal weakness.    Objective: Vitals: Blood pressure 105/83, pulse  68, temperature 98 F (36.7 C), temperature source Oral, resp. rate 18, height 5\' 9"  (1.753 m), weight 86.2 kg, SpO2 97 %.   Exam:  Awake Alert, No new F.N deficits, Normal affect [place].AT,PERRAL Supple Neck, No JVD,   Symmetrical Chest wall movement, Good air movement bilaterally, CTAB RRR,No Gallops, Rubs or new Murmurs,  +ve B.Sounds, Abd Soft, No tenderness,    Right AKA-left great toe amputation.    Assessment/Plan:  Pulmonary embolism with right lung infarct: Pleuritic pain improving-continue Eliquis.  Etiology of VTE likely due to noncompliance.  Given that he has had VTE in the past-needs indefinite anticoagulation.   Chronic HFpEF/HFrEF: Euvolemic-continue Lasix/Coreg/lisinopril.  If compliant with this regimen-could be switched to Entresto at some point.  Follow with cardiology in the outpatient setting.   Cough: Suspect from GERD/lung infarct-although at risk for aspiration-do not think he has pneumonia-he may have chemical pneumonitis-Augmentin discontinued on 9/21.  Suicidal ideation: Appreciate psych input - now stable, cleared by psych.  History of bipolar disorder: Continue Abilify.   History of CVA with right hemiparesis/facial droop: On anticoagulation  Peripheral neuropathy: Continues to have intermittent mostly left lower extremity pain-continue scheduled Neurontin-and as needed oxycodone for breakthrough pain.  DM-2 (A1c 6.0 on 9/17): CBG stable with SSI.  Will liberalize his diet and see what happens to his sugars.  Follow-up closely as he is noncompliant with diet and now does not want carb modified diet anymore.  May require addition of long-acting insulin.  Recent Labs    08/29/22 2322 08/30/22 0428 08/30/22 0834  GLUCAP 117* 135* 133*    History of right AKA-left great toe amputation  COPD: Not in exacerbation-continue Brovana/Yupelri/budesonide- will convert to inhaler regimen when closer to discharge.  Polysubstance abuse-cocaine:  Counseled  Homelessness:  Very vague about his situation-claims he sleeps wherever he can-eats whatever he can get-claims he is on a wheelchair (right AKA/right hemiparesis).  Social worker following.  BMI: Estimated body mass index is 28.06 kg/m as calculated from the following:   Height as of this encounter: 5\' 9"  (1.753 m).   Weight as of this encounter: 86.2 kg.   Code status:   Code Status: Full Code   DVT Prophylaxis: apixaban (ELIQUIS) tablet 5 mg    Family Communication: None at bedside  Disposition Plan: Status is: Inpatient Medically stable await safe disposition, TOC working on it.  Diet: Diet Order             Diet regular Room service appropriate? Yes; Fluid consistency: Thin  Diet effective now                    MEDICATIONS: Scheduled Meds:  apixaban  5 mg Oral BID   arformoterol  15 mcg Nebulization BID   ARIPiprazole  10 mg Oral Daily   budesonide (PULMICORT) nebulizer solution  0.25 mg Nebulization BID   carvedilol  3.125 mg Oral BID WC   escitalopram  5 mg Oral Daily   folic acid  1 mg Oral Daily   furosemide  40 mg Oral BID   gabapentin  400 mg Oral TID   insulin aspart  0-9 Units Subcutaneous TID WC   lisinopril  10 mg Oral Daily   revefenacin  175 mcg Nebulization Daily   thiamine  100 mg Oral Daily   Continuous Infusions: PRN Meds:.hydrALAZINE, oxyCODONE   I have personally reviewed following labs and imaging studies  LABORATORY DATA:  Recent Labs  Lab 08/24/22 0620 08/28/22 0458  WBC 5.5 5.8  HGB 12.7* 12.9*  HCT 38.7* 38.7*  PLT 386 509*  MCV 88.0 87.0  MCH 28.9 29.0  MCHC 32.8 33.3  RDW 17.2* 16.7*  LYMPHSABS  --  2.4  MONOABS  --  0.8  EOSABS  --  0.1  BASOSABS  --  0.1    Recent Labs  Lab 08/24/22 0620 08/28/22 0458  NA 138 137  K 4.3 4.3  CL 101 98  CO2 23 24  GLUCOSE 97 95  BUN 15 22*  CREATININE 0.86 0.98  CALCIUM 9.2 9.9  MG 2.0 2.1    RADIOLOGY STUDIES/RESULTS: No results found.   LOS: 13  days   Signature  08/30/22 M.D on 08/30/2022 at 10:03 AM   -  To page go to [email or web address]

## 2022-08-31 LAB — GLUCOSE, CAPILLARY
Glucose-Capillary: 158 mg/dL — ABNORMAL HIGH (ref 70–99)
Glucose-Capillary: 112 mg/dL — ABNORMAL HIGH (ref 70–99)
Glucose-Capillary: 109 mg/dL — ABNORMAL HIGH (ref 70–99)
Glucose-Capillary: 101 mg/dL — ABNORMAL HIGH (ref 70–99)

## 2022-08-31 NOTE — Plan of Care (Signed)

## 2022-08-31 NOTE — Progress Notes (Signed)
PROGRESS NOTE        PATIENT DETAILS Name: [name] Age: 56 y.o. Sex: male Date of Birth: [birth year] Admit Date: 08/16/2022 Admitting Physician Clance Boll, MD [id number], [name], NP  Brief Summary:  Patient is a 56 y.o.  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  He was subsequently started on anticoagulation and admitted to the hospitalist service.  Further hospital course was complicated by patient having suicidal ideation-psychiatry was consulted-bedside sitter was placed-with recommendations to pursue inpatient psychiatric admission.  See below for further details.  Significant events: 5/4-5/12>> hospitalization for sepsis from cellulitis from chronic lower extremity wound. 8/12-8/25>> hospitalization for HFrEF exacerbation/hypoxia 9/16>> admit to [facility]-pleuritic chest pain/shortness of breath-CTA with bilateral PE and pulmonary infarct.  Significant studies: 9/16>> CTA chest: Bilateral PE-right lung infarct. 9/16>> bilateral lower extremity angiogram: No major stenosis. 9/17>> x-ray left ankle: No fracture/dislocation. 9/17>> UDS: Positive for opiates/cocaine 9/18>> lower extremity Doppler: No DVT 9/18>> Limited echo: LN<98%, RV systolic function moderately reduced. 9/20>> CXR: Slight worsening of peripheral density in the right mid/lower lung.  Significant microbiology data: None  Procedures:  None  Consults:  Psychiatry  Subjective:  Patient in bed, appears comfortable, denies any headache, no fever, no chest pain or pressure, no shortness of breath , no abdominal pain. No new focal weakness.  Objective: Vitals: Blood pressure 114/62, pulse 74,  temperature 97.8 F (36.6 C), temperature source Oral, resp. rate 19, height 5\' 9"  (1.753 m), weight 86.2 kg, SpO2 96 %.   Exam:  Awake Alert, No new F.N deficits, Normal affect [place].AT,PERRAL Supple Neck, No JVD,   Symmetrical Chest wall movement, Good air movement bilaterally, CTAB RRR,No Gallops, Rubs or new Murmurs,  +ve B.Sounds, Abd Soft, No tenderness,    Right AKA-left great toe amputation.    Assessment/Plan:  Pulmonary embolism with right lung infarct: Pleuritic pain improving-continue Eliquis.  Etiology of VTE likely due to noncompliance.  Given that he has had VTE in the past-needs indefinite anticoagulation.   Chronic HFpEF/HFrEF: Euvolemic-continue Lasix/Coreg/lisinopril.  If compliant with this regimen-could be switched to Entresto at some point.  Follow with cardiology in the outpatient setting.   Cough: Suspect from GERD/lung infarct-although at risk for aspiration-do not think he has pneumonia-he may have chemical pneumonitis-Augmentin discontinued on 9/21.  Suicidal ideation: Appreciate psych input - now stable, cleared by psych.  History of bipolar disorder: Continue Abilify.   History of CVA with right hemiparesis/facial droop: On anticoagulation  Peripheral neuropathy: Continues to have intermittent mostly left lower extremity pain-continue scheduled Neurontin-and as needed oxycodone for breakthrough pain.  DM-2 (A1c 6.0 on 9/17): CBG stable with SSI.  Will liberalize his diet and see what happens to his sugars.  Follow-up closely as he is noncompliant with diet and now does not want carb modified diet anymore.  May require addition of long-acting insulin.  Recent Labs    08/30/22 1548 08/30/22 2113 08/31/22 0816  GLUCAP 106* 110* 158*    History of right AKA-left great toe amputation  COPD: Not in exacerbation-continue Brovana/Yupelri/budesonide- will convert to inhaler regimen when closer to discharge.  Polysubstance abuse-cocaine:  Counseled  Homelessness: Very  vague about his situation-claims he sleeps wherever he can-eats whatever he can get-claims he is on a wheelchair (right AKA/right hemiparesis).  Social worker following.  BMI: Estimated body mass index is 28.06 kg/m as calculated from the following:   Height as of this encounter: 5\' 9"  (1.753 m).   Weight as of this encounter: 86.2 kg.   Code status:   Code Status: Full Code   DVT Prophylaxis: apixaban (ELIQUIS) tablet 5 mg    Family Communication: None at bedside  Disposition Plan: Status is: Inpatient Medically stable await safe disposition, TOC working on it.  Diet: Diet Order             Diet regular Room service appropriate? Yes; Fluid consistency: Thin  Diet effective now                    MEDICATIONS: Scheduled Meds:  apixaban  5 mg Oral BID   arformoterol  15 mcg Nebulization BID   ARIPiprazole  10 mg Oral Daily   budesonide (PULMICORT) nebulizer solution  0.25 mg Nebulization BID   carvedilol  3.125 mg Oral BID WC   escitalopram  5 mg Oral Daily   folic acid  1 mg Oral Daily   furosemide  40 mg Oral BID   gabapentin  400 mg Oral TID   insulin aspart  0-9 Units Subcutaneous TID WC   lisinopril  10 mg Oral Daily   revefenacin  175 mcg Nebulization Daily   thiamine  100 mg Oral Daily   Continuous Infusions: PRN Meds:.hydrALAZINE, oxyCODONE   I have personally reviewed following labs and imaging studies  LABORATORY DATA:  Recent Labs  Lab 08/28/22 0458  WBC 5.8  HGB 12.9*  HCT 38.7*  PLT 509*  MCV 87.0  MCH 29.0  MCHC 33.3  RDW 16.7*  LYMPHSABS 2.4  MONOABS 0.8  EOSABS 0.1  BASOSABS 0.1    Recent Labs  Lab 08/28/22 0458  NA 137  K 4.3  CL 98  CO2 24  GLUCOSE 95  BUN 22*  CREATININE 0.98  CALCIUM 9.9  MG 2.1    RADIOLOGY STUDIES/RESULTS: No results found.   LOS: 14 days   Signature  08/30/22 M.D on 08/31/2022 at 8:47 AM   -  To page go to [email or web address]

## 2022-08-31 NOTE — Plan of Care (Signed)
  Problem: Health Behavior/Discharge Planning: Goal: Ability to manage health-related needs will improve Outcome: Progressing   Problem: Clinical Measurements: Goal: Ability to maintain clinical measurements within normal limits will improve Outcome: Progressing   Problem: Clinical Measurements: Goal: Will remain free from infection Outcome: Progressing   

## 2022-09-01 DIAGNOSIS — F1420 Cocaine dependence, uncomplicated: Secondary | ICD-10-CM

## 2022-09-01 LAB — CBC WITH DIFFERENTIAL/PLATELET
Abs Immature Granulocytes: 0.02 10*3/uL (ref 0.00–0.07)
Basophils Absolute: 0 10*3/uL (ref 0.0–0.1)
Basophils Relative: 1 %
Eosinophils Absolute: 0.1 10*3/uL (ref 0.0–0.5)
Eosinophils Relative: 2 %
HCT: 38.5 % — ABNORMAL LOW (ref 39.0–52.0)
Hemoglobin: 12.4 g/dL — ABNORMAL LOW (ref 13.0–17.0)
Immature Granulocytes: 0 %
Lymphocytes Relative: 43 %
Lymphs Abs: 2.3 10*3/uL (ref 0.7–4.0)
MCH: 28.8 pg (ref 26.0–34.0)
MCHC: 32.2 g/dL (ref 30.0–36.0)
MCV: 89.5 fL (ref 80.0–100.0)
Monocytes Absolute: 0.7 10*3/uL (ref 0.1–1.0)
Monocytes Relative: 13 %
Neutro Abs: 2.2 10*3/uL (ref 1.7–7.7)
Neutrophils Relative %: 41 %
Platelets: 446 10*3/uL — ABNORMAL HIGH (ref 150–400)
RBC: 4.3 MIL/uL (ref 4.22–5.81)
RDW: 17.1 % — ABNORMAL HIGH (ref 11.5–15.5)
WBC: 5.3 10*3/uL (ref 4.0–10.5)
nRBC: 0 % (ref 0.0–0.2)

## 2022-09-01 LAB — BASIC METABOLIC PANEL
Anion gap: 7 (ref 5–15)
BUN: 16 mg/dL (ref 6–20)
CO2: 26 mmol/L (ref 22–32)
Calcium: 9.3 mg/dL (ref 8.9–10.3)
Chloride: 106 mmol/L (ref 98–111)
Creatinine, Ser: 0.88 mg/dL (ref 0.61–1.24)
GFR, Estimated: >60 mL/min (ref >60)
Glucose, Bld: 95 mg/dL (ref 70–99)
Potassium: 4 mmol/L (ref 3.5–5.1)
Sodium: 139 mmol/L (ref 135–145)

## 2022-09-01 LAB — GLUCOSE, CAPILLARY
Glucose-Capillary: 198 mg/dL — ABNORMAL HIGH (ref 70–99)
Glucose-Capillary: 112 mg/dL — ABNORMAL HIGH (ref 70–99)
Glucose-Capillary: 95 mg/dL (ref 70–99)
Glucose-Capillary: 156 mg/dL — ABNORMAL HIGH (ref 70–99)

## 2022-09-01 LAB — MAGNESIUM: Magnesium: 1.9 mg/dL (ref 1.7–2.4)

## 2022-09-01 NOTE — Progress Notes (Signed)
PROGRESS NOTE        PATIENT DETAILS Name: [name] Age: 56 y.o. Sex: male Date of Birth: [birth year] Admit Date: 08/16/2022 Admitting Physician Clance Boll, MD [id number], [name], NP  Brief Summary:  Patient is a 56 y.o.  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  He was subsequently started on anticoagulation and admitted to the hospitalist service.  Further hospital course was complicated by patient having suicidal ideation-psychiatry was consulted-bedside sitter was placed-with recommendations to pursue inpatient psychiatric admission.  See below for further details.  Significant events: 5/4-5/12>> hospitalization for sepsis from cellulitis from chronic lower extremity wound. 8/12-8/25>> hospitalization for HFrEF exacerbation/hypoxia 9/16>> admit to [facility]-pleuritic chest pain/shortness of breath-CTA with bilateral PE and pulmonary infarct.  Significant studies: 9/16>> CTA chest: Bilateral PE-right lung infarct. 9/16>> bilateral lower extremity angiogram: No major stenosis. 9/17>> x-ray left ankle: No fracture/dislocation. 9/17>> UDS: Positive for opiates/cocaine 9/18>> lower extremity Doppler: No DVT 9/18>> Limited echo: [id number], RV systolic function moderately reduced. 9/20>> CXR: Slight worsening of peripheral density in the right mid/lower lung.  Significant microbiology data: None  Procedures:  None  Consults:  Psychiatry  Subjective:  Patient in bed, appears comfortable, denies any headache, no fever, no chest pain or pressure, no shortness of breath , no abdominal pain. No new focal weakness.  Objective: Vitals: Blood pressure (!) 151/93, pulse  80, temperature (!) 97.5 F (36.4 C), temperature source Oral, resp. rate 20, height 5\' 9"  (1.753 m), weight 86.2 kg, SpO2 100 %.   Exam:  Awake Alert, No new F.N deficits, Normal affect [place].AT,PERRAL Supple Neck, No JVD,   Symmetrical Chest wall movement, Good air movement bilaterally, CTAB RRR,No Gallops, Rubs or new Murmurs,  +ve B.Sounds, Abd Soft, No tenderness,     Right AKA-left great toe amputation.    Assessment/Plan:  Pulmonary embolism with right lung infarct: Pleuritic pain improving-continue Eliquis.  Etiology of VTE likely due to noncompliance.  Given that he has had VTE in the past-needs indefinite anticoagulation.   Chronic HFpEF/HFrEF: Euvolemic-continue Lasix/Coreg/lisinopril.  If compliant with this regimen-could be switched to Entresto at some point.  Follow with cardiology in the outpatient setting.   Cough: Suspect from GERD/lung infarct-although at risk for aspiration-do not think he has pneumonia-he may have chemical pneumonitis-Augmentin discontinued on 9/21.  Suicidal ideation: Appreciate psych input - now stable, cleared by psych.  History of bipolar disorder: Continue Abilify.   History of CVA with right hemiparesis/facial droop: On anticoagulation  Peripheral neuropathy: Continues to have intermittent mostly left lower extremity pain-continue scheduled Neurontin-and as needed oxycodone for breakthrough pain.  DM-2 (A1c 6.0 on 9/17): CBG stable with SSI.  Will liberalize his diet and see what happens to his sugars.  Follow-up closely as he is noncompliant with diet and now does not want carb modified diet anymore.  May require addition of long-acting insulin.  Recent Labs    08/31/22 1635 08/31/22 1955 09/01/22 0845  GLUCAP 109* 101* 198*    History of right AKA-left great toe amputation  COPD: Not in exacerbation-continue Brovana/Yupelri/budesonide- will convert to inhaler regimen when closer to discharge.  Polysubstance abuse-cocaine:  Counseled  Homelessness: Very vague about his situation-claims he sleeps wherever he can-eats whatever he can get-claims he is on a wheelchair (right AKA/right hemiparesis).  Social worker following.  BMI: Estimated body mass index is 28.06 kg/m as calculated from the following:   Height as of this encounter: 5\' 9"  (1.753 m).   Weight as of this encounter: 86.2 kg.   Code status:   Code Status: Full Code   DVT Prophylaxis: apixaban (ELIQUIS) tablet 5 mg    Family Communication: None at bedside  Disposition Plan: Status is: Inpatient Medically stable await safe disposition, TOC working on it.  Diet: Diet Order             Diet regular Room service appropriate? Yes; Fluid consistency: Thin  Diet effective now                    MEDICATIONS: Scheduled Meds:  apixaban  5 mg Oral BID   arformoterol  15 mcg Nebulization BID   ARIPiprazole  10 mg Oral Daily   budesonide (PULMICORT) nebulizer solution  0.25 mg Nebulization BID   carvedilol  3.125 mg Oral BID WC   escitalopram  5 mg Oral Daily   folic acid  1 mg Oral Daily   furosemide  40 mg Oral BID   gabapentin  400 mg Oral TID   insulin aspart  0-9 Units Subcutaneous TID WC   lisinopril  10 mg Oral Daily   revefenacin  175 mcg Nebulization Daily   thiamine  100 mg Oral Daily   Continuous Infusions: PRN Meds:.hydrALAZINE, oxyCODONE   I have personally reviewed following labs and imaging studies  LABORATORY DATA:  Recent Labs  Lab 08/28/22 0458 09/01/22 0107  WBC 5.8 5.3  HGB 12.9* 12.4*  HCT 38.7* 38.5*  PLT 509* 446*  MCV 87.0 89.5  MCH 29.0 28.8  MCHC 33.3 32.2  RDW 16.7* 17.1*  LYMPHSABS 2.4 2.3  MONOABS 0.8 0.7  EOSABS 0.1 0.1  BASOSABS 0.1 0.0    Recent Labs  Lab 08/28/22 0458 09/01/22 0107  NA 137 139  K 4.3 4.0  CL 98 106  CO2 24 26  GLUCOSE 95 95  BUN 22* 16  CREATININE 0.98 0.88  CALCIUM 9.9 9.3  MG 2.1 1.9    RADIOLOGY STUDIES/RESULTS: No results found.   LOS: 15 days    Signature  Lala Lund M.D on 09/01/2022 at 9:50 AM   -  To page go to [email or web address]

## 2022-09-02 LAB — GLUCOSE, CAPILLARY
Glucose-Capillary: 106 mg/dL — ABNORMAL HIGH (ref 70–99)
Glucose-Capillary: 108 mg/dL — ABNORMAL HIGH (ref 70–99)
Glucose-Capillary: 130 mg/dL — ABNORMAL HIGH (ref 70–99)
Glucose-Capillary: 164 mg/dL — ABNORMAL HIGH (ref 70–99)
Glucose-Capillary: 90 mg/dL (ref 70–99)

## 2022-09-02 MED ORDER — AMLODIPINE BESYLATE 10 MG PO TABS
10.0000 mg | ORAL_TABLET | Freq: Every day | ORAL | Status: DC
  Administered 2022-09-02 – 2022-10-09 (×38): 10 mg via ORAL
  Filled 2022-09-02 (×38): qty 1

## 2022-09-02 NOTE — Plan of Care (Signed)
  Problem: Education: Goal: Knowledge of General Education information will improve Description Including pain rating scale, medication(s)/side effects and non-pharmacologic comfort measures Outcome: Progressing   Problem: Health Behavior/Discharge Planning: Goal: Ability to manage health-related needs will improve Outcome: Progressing   

## 2022-09-02 NOTE — Progress Notes (Signed)
PROGRESS NOTE        PATIENT DETAILS Name: [name] Age: 56 y.o. Sex: male Date of Birth: 1965-12-05 Admit Date: 08/16/2022 Admitting Physician Lurline Del, MD CVE:LFYBOF, Chales Abrahams, NP  Brief Summary:  Patient is a 56 y.o.  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  He was subsequently started on anticoagulation and admitted to the hospitalist service.  Further hospital course was complicated by patient having suicidal ideation-psychiatry was consulted-bedside sitter was placed-with recommendations to pursue inpatient psychiatric admission.  See below for further details.  Significant events: 5/4-5/12>> hospitalization for sepsis from cellulitis from chronic lower extremity wound. 8/12-8/25>> hospitalization for HFrEF exacerbation/hypoxia 9/16>> admit to [facility]-pleuritic chest pain/shortness of breath-CTA with bilateral PE and pulmonary infarct.  Significant studies: 9/16>> CTA chest: Bilateral PE-right lung infarct. 9/16>> bilateral lower extremity angiogram: No major stenosis. 9/17>> x-ray left ankle: No fracture/dislocation. 9/17>> UDS: Positive for opiates/cocaine 9/18>> lower extremity Doppler: No DVT 9/18>> Limited echo: EF<20%, RV systolic function moderately reduced. 9/20>> CXR: Slight worsening of peripheral density in the right mid/lower lung.  Significant microbiology data: None  Procedures:  None  Consults:  Psychiatry  Subjective:  Patient in bed, appears comfortable, denies any headache, no fever, no chest pain or pressure, no shortness of breath , no abdominal pain. No new focal weakness.   Objective: Vitals: Blood pressure (!) 152/78, pulse  81, temperature 98.7 F (37.1 C), temperature source Oral, resp. rate 20, height 5\' 9"  (1.753 m), weight 86.2 kg, SpO2 99 %.   Exam:  Awake Alert, No new F.N deficits, Normal affect [place].AT,PERRAL Supple Neck, No JVD,   Symmetrical Chest wall movement, Good air movement bilaterally, CTAB RRR,No Gallops, Rubs or new Murmurs,  +ve B.Sounds, Abd Soft, No tenderness,   Right AKA-left great toe amputation.    Assessment/Plan:  Pulmonary embolism with right lung infarct: Pleuritic pain improving-continue Eliquis.  Etiology of VTE likely due to noncompliance.  Given that he has had VTE in the past-needs indefinite anticoagulation.   Chronic HFpEF/HFrEF: Euvolemic-continue Lasix/Coreg/lisinopril.  If compliant with this regimen-could be switched to Entresto at some point.  Follow with cardiology in the outpatient setting.   Cough: Suspect from GERD/lung infarct-although at risk for aspiration-do not think he has pneumonia-he may have chemical pneumonitis-Augmentin discontinued on 9/21.  Suicidal ideation: Appreciate psych input - now stable, cleared by psych.  HTN - start Norvasc  History of bipolar disorder: Continue Abilify.   History of CVA with right hemiparesis/facial droop: On anticoagulation  Peripheral neuropathy: Continues to have intermittent mostly left lower extremity pain-continue scheduled Neurontin-and as needed oxycodone for breakthrough pain.  DM-2 (A1c 6.0 on 9/17): CBG stable with SSI.  Will liberalize his diet and see what happens to his sugars.  Follow-up closely as he is noncompliant with diet and now does not want carb modified diet anymore.  May require addition of long-acting insulin.  Recent Labs    09/01/22 1621 09/01/22 2023 09/02/22 0818  GLUCAP 95 156* 164*    History of right AKA-left great toe amputation  COPD: Not in exacerbation-continue Brovana/Yupelri/budesonide- will convert to inhaler regimen when closer to discharge.  Polysubstance  abuse-cocaine: Counseled  Homelessness: Very vague about his situation-claims he sleeps wherever he can-eats whatever he can get-claims he is on a wheelchair (right AKA/right hemiparesis).  Social worker following.  BMI: Estimated body mass index is 28.06 kg/m as calculated from the following:   Height as of this encounter: 5\' 9"  (1.753 m).   Weight as of this encounter: 86.2 kg.   Code status:   Code Status: Full Code   DVT Prophylaxis: apixaban (ELIQUIS) tablet 5 mg    Family Communication: None at bedside  Disposition Plan: Status is: Inpatient Medically stable await safe disposition, TOC working on it.  Diet: Diet Order             Diet regular Room service appropriate? Yes; Fluid consistency: Thin  Diet effective now                    MEDICATIONS: Scheduled Meds:  apixaban  5 mg Oral BID   arformoterol  15 mcg Nebulization BID   ARIPiprazole  10 mg Oral Daily   budesonide (PULMICORT) nebulizer solution  0.25 mg Nebulization BID   carvedilol  3.125 mg Oral BID WC   escitalopram  5 mg Oral Daily   folic acid  1 mg Oral Daily   furosemide  40 mg Oral BID   gabapentin  400 mg Oral TID   insulin aspart  0-9 Units Subcutaneous TID WC   lisinopril  10 mg Oral Daily   revefenacin  175 mcg Nebulization Daily   thiamine  100 mg Oral Daily   Continuous Infusions: PRN Meds:.hydrALAZINE, oxyCODONE   I have personally reviewed following labs and imaging studies  LABORATORY DATA:  Recent Labs  Lab 08/28/22 0458 09/01/22 0107  WBC 5.8 5.3  HGB 12.9* 12.4*  HCT 38.7* 38.5*  PLT 509* 446*  MCV 87.0 89.5  MCH 29.0 28.8  MCHC 33.3 32.2  RDW 16.7* 17.1*  LYMPHSABS 2.4 2.3  MONOABS 0.8 0.7  EOSABS 0.1 0.1  BASOSABS 0.1 0.0    Recent Labs  Lab 08/28/22 0458 09/01/22 0107  NA 137 139  K 4.3 4.0  CL 98 106  CO2 24 26  GLUCOSE 95 95  BUN 22* 16  CREATININE 0.98 0.88  CALCIUM 9.9 9.3  MG 2.1 1.9    RADIOLOGY STUDIES/RESULTS: No results  found.   LOS: 16 days   Signature  Lala Lund M.D on 09/02/2022 at 10:35 AM   -  To page go to [email or web address]

## 2022-09-02 NOTE — TOC Progression Note (Addendum)
Transition of Care [facility]) - Progression Note    Patient Details  Name: [name] MRN: [id number] Date of Birth: 1966/03/10  Transition of Care [facility]) CM/SW [place], [place] Phone Number: 09/02/2022, 9:27 AM  Clinical Narrative:    Patient continues with no SNF bed offers. [facility] reported patient owes them money.    Expected Discharge Plan: Skilled Nursing Facility Barriers to Discharge: [place] (PASRR), Insurance Authorization, SNF Pending bed offer, Active Substance Use - Placement, Homeless with medical needs  Expected Discharge Plan and Services Expected Discharge Plan: [place] In-house Referral: Clinical Social Work   Post Acute Care Choice: [place] Living arrangements for the past 2 months: Homeless                                       Social Determinants of Health (SDOH) Interventions    Readmission Risk Interventions    08/18/2022    9:00 AM 07/18/2022    4:11 PM 04/08/2022    1:43 PM  Readmission Risk Prevention Plan  Transportation Screening Complete Complete Complete  Medication Review Press photographer) Complete Complete Complete  PCP or Specialist appointment within 3-5 days of discharge Complete Complete Complete  HRI or Home Care Consult Complete Complete Not Complete  HRI or Home Care Consult Pt Refusal Comments   Pt is homeless  SW Recovery Care/Counseling Consult Complete Complete Complete  Palliative Care Screening Not Applicable  Not [place] Not Applicable Complete Not Applicable

## 2022-09-02 NOTE — Progress Notes (Signed)
Mobility Specialist Progress Note:   09/02/22 1035  Mobility  Activity Ambulated with assistance in room;Stood at bedside  Activity Response Tolerated well  Distance Ambulated (ft) 6 ft  $Mobility charge 1 Mobility  Level of Assistance Contact guard assist, steadying assist  Assistive Device Front wheel walker  Mobility Referral Yes   Pt requesting to practice mobility without w/c. States he is tired of being in a w/c for all mobility and would like to practice RW use for short distances. Pt able to stand and take a few hops/steps forward and backward. Distance limited d/t fatigue. Pt left sitting EOB with all needs met.   [name] Acute Rehab Secure Chat or Office Phone: [phone]

## 2022-09-03 DIAGNOSIS — R45851 Suicidal ideations: Secondary | ICD-10-CM

## 2022-09-03 LAB — GLUCOSE, CAPILLARY
Glucose-Capillary: 160 mg/dL — ABNORMAL HIGH (ref 70–99)
Glucose-Capillary: 155 mg/dL — ABNORMAL HIGH (ref 70–99)
Glucose-Capillary: 72 mg/dL (ref 70–99)
Glucose-Capillary: 106 mg/dL — ABNORMAL HIGH (ref 70–99)
Glucose-Capillary: 112 mg/dL — ABNORMAL HIGH (ref 70–99)

## 2022-09-03 NOTE — Progress Notes (Signed)
Occupational Therapy Treatment Patient Details Name: [name] MRN: [id number] DOB: 1966/03/23 Today's Date: 09/03/2022   History of present illness Pt is a 56yo M who presented 9/16 with c/o chest pain, SOB, and leg swelling. Found to have B PE. PMH includes asthma, bioplar 1, depression, schizoaffective disorder, CVA, chronic CHF, homelessness, HTN, DM2, cocaine and cannabis use and R AKA.   OT comments  Patient received in supine and eager to participate. Patient able to get to EOB without assitsance and donn gown. Patient attempted to donn sock but was unable to from EOB and performed squat pivot transfer to wheelchair and required mod assist. Patient performed grooming tasks seated at sink and toilet transfer with use of rails with min guard assist. Patient performed 2 stands at sink with min guard to stand and for balance. Patient continues to make good progress and would benefit from further OT therapy to address self care and transfers.    Recommendations for follow up therapy are one component of a multi-disciplinary discharge planning process, led by the attending physician.  Recommendations may be updated based on patient status, additional functional criteria and insurance authorization.    Follow Up Recommendations  Skilled nursing-short term rehab (<3 hours/day)    Assistance Recommended at Discharge Frequent or constant Supervision/Assistance  Patient can return home with the following  A little help with walking and/or transfers;A little help with bathing/dressing/bathroom   Equipment Recommendations  Wheelchair (measurements OT)    Recommendations for Other Services      Precautions / Restrictions Precautions Precautions: Fall Restrictions Weight Bearing Restrictions: No       Mobility Bed Mobility Overal bed mobility: Modified Independent Bed Mobility: Supine to Sit     Supine to sit: Modified independent (Device/Increase time)     General bed mobility  comments: no assistance to get to EOB, remained on EOB at end of session    Transfers Overall transfer level: Needs assistance Equipment used: None Transfers: Bed to chair/wheelchair/BSC     Squat pivot transfers: Supervision       General transfer comment: squat pivot transfer from EOB to wheelchair and wheelchair to toilet     Balance Overall balance assessment: Needs assistance Sitting-balance support: No upper extremity supported Sitting balance-Leahy Scale: Good     Standing balance support: During functional activity, Bilateral upper extremity supported Standing balance-Leahy Scale: Poor Standing balance comment: performed standing x2 at sink                           ADL either performed or assessed with clinical judgement   ADL Overall ADL's : Needs assistance/impaired     Grooming: Wash/dry hands;Wash/dry face;Oral care;Set up;Sitting Grooming Details (indicate cue type and reason): in wheelchair at sink         Upper Body Dressing : Supervision/safety;Sitting Upper Body Dressing Details (indicate cue type and reason): donned gown seated on EOB Lower Body Dressing: Moderate assistance;Sitting/lateral leans Lower Body Dressing Details (indicate cue type and reason): attempted to donn sock seated on EOB with difficulty reaching foot. Transferred to wheelchair and patient was mod assist to compete with assistance getting sock past toes Toilet Transfer: Min guard;Regular Toilet;Grab Programme researcher, broadcasting/film/video Details (indicate cue type and reason): patient able to transfer from wheelchair to toilet with use of rail and min guard for safety           General ADL Comments: unable to stand for self care tasks  Extremity/Trunk Assessment Upper Extremity Assessment RUE Deficits / Details: Ataxic RUE from prior CVA. Ataxic during all intentional reaching and AROM at the shoulder limited to 90 degrees. MMT 3+/5 overall distally except hand with  Negative composite grip. RUE Sensation: decreased light touch RUE Coordination: decreased gross motor;decreased fine motor            Vision       Perception     Praxis      Cognition Arousal/Alertness: Awake/alert Behavior During Therapy: WFL for tasks assessed/performed Overall Cognitive Status: No family/caregiver present to determine baseline cognitive functioning Area of Impairment: Attention, Memory, Following commands, Safety/judgement, Awareness, Problem solving                   Current Attention Level: Selective Memory: Decreased short-term memory Following Commands: Follows one step commands with increased time, Follows multi-step commands inconsistently Safety/Judgement: Decreased awareness of safety Awareness: Emergent Problem Solving: Slow processing, Difficulty sequencing, Requires verbal cues, Requires tactile cues General Comments: patient eager to participate with therapy and is motivated towards progress        Exercises      Shoulder Instructions       General Comments BP 128/85 (98) seated EOB post-exertion; SpO2 98% on RA, HR 97 mid-exercise    Pertinent Vitals/ Pain       Pain Assessment Pain Assessment: Faces Faces Pain Scale: Hurts a little bit Pain Location: L foot/ankle Pain Descriptors / Indicators: Sore, Guarding Pain Intervention(s): Limited activity within patient's tolerance, Monitored during session, Repositioned  Home Living                                          Prior Functioning/Environment              Frequency  Min 3X/week        Progress Toward Goals  OT Goals(current goals can now be found in the care plan section)  Progress towards OT goals: Progressing toward goals  Acute Rehab OT Goals Patient Stated Goal: get better OT Goal Formulation: With patient Time For Goal Achievement: 09/04/22 Potential to Achieve Goals: Good ADL Goals Pt Will Perform Upper Body Bathing: with  supervision Pt Will Perform Lower Body Bathing: with supervision Pt Will Perform Upper Body Dressing: with supervision Pt Will Perform Lower Body Dressing: with supervision Pt Will Transfer to Toilet: with supervision Pt Will Perform Toileting - Clothing Manipulation and hygiene: with supervision  Plan Discharge plan remains appropriate    Co-evaluation                 AM-PAC OT "6 Clicks" Daily Activity     Outcome Measure   Help from another person eating meals?: None Help from another person taking care of personal grooming?: None Help from another person toileting, which includes using toliet, bedpan, or urinal?: A Little Help from another person bathing (including washing, rinsing, drying)?: A Little Help from another person to put on and taking off regular upper body clothing?: A Little Help from another person to put on and taking off regular lower body clothing?: A Little 6 Click Score: 20    End of Session Equipment Utilized During Treatment: Other (comment) (wheelchair)  OT Visit Diagnosis: Unsteadiness on feet (R26.81);Muscle weakness (generalized) (M62.81);Other abnormalities of gait and mobility (R26.89)   Activity Tolerance Patient tolerated treatment well   Patient Left in bed;with call bell/phone  within reach   Nurse Communication Mobility status        Time: 6294-7654 OT Time Calculation (min): 25 min  Charges: OT General Charges $OT Visit: 1 Visit OT Treatments $Self Care/Home Management : 23-37 mins  Alfonse Flavors, OTA Acute Rehabilitation Services  Office [phone]   Dewain Penning 09/03/2022, 12:09 PM

## 2022-09-03 NOTE — Progress Notes (Signed)
Physical Therapy Treatment Patient Details Name: [name] MRN: [id number] DOB: 1965/12/20 Today's Date: 09/03/2022   History of Present Illness Pt is a 56yo M who presented 9/16 with c/o chest pain, SOB, and leg swelling. Found to have B PE. PMH includes asthma, bioplar 1, depression, schizoaffective disorder, CVA, chronic CHF, homelessness, HTN, DM2, cocaine and cannabis use and R AKA.    PT Comments    Pt received in supine, agreeable to therapy session and with good participation and tolerance for transfer training and wheelchair navigation in hallway. Pt has met bed mobility goal. Pt with good ability to find his room after propelling wheelchair in hallway and main challenge is due to wheel on L front corner of chair which is damaged and requires more effort to propel. Pt will be needing another wheelchair at DC for his safety, as his is lacking arm rests, has damaged wheel and no leg rests. Pt may benefit from power wheelchair as it is his primary source of mobility and seating. Pt needing Supervision mostly for transfers to/from chair, he defers gait trial in room as he recently ambulated short distance with mobility specialist in room. Pt due for goal update next session. Pt continues to benefit from PT services to progress toward functional mobility goals.    Recommendations for follow up therapy are one component of a multi-disciplinary discharge planning process, led by the attending physician.  Recommendations may be updated based on patient status, additional functional criteria and insurance authorization.  Follow Up Recommendations  Skilled nursing-short term rehab (<3 hours/day) Can patient physically be transported by private vehicle: Yes (may need wheelchair Lucianne Lei if he has power WC to transport)   Assistance Recommended at Discharge Set up Supervision/Assistance  Patient can return home with the following A little help with walking and/or transfers;A little help with  bathing/dressing/bathroom   Equipment Recommendations  Wheelchair (measurements PT);Wheelchair cushion (measurements PT) (pt requesting power wheelchair)    Recommendations for Other Services       Precautions / Restrictions Precautions Precautions: Fall Restrictions Weight Bearing Restrictions: No     Mobility  Bed Mobility Overal bed mobility: Modified Independent Bed Mobility: Supine to Sit, Sit to Supine     Supine to sit: Modified independent (Device/Increase time) Sit to supine: Modified independent (Device/Increase time)   General bed mobility comments: Mod I with bed mobility    Transfers Overall transfer level: Needs assistance Equipment used: Rolling walker (2 wheels), None Transfers: Sit to/from Stand, Bed to chair/wheelchair/BSC Sit to Stand: Min guard, Supervision Stand pivot transfers: Min guard   Squat pivot transfers: Supervision     General transfer comment: Cues for w/c locking, min guard initially for safety upon standing but pt requesting Supervision and no overt LOB pivoting with RW to WC. Supervision to return pivot with good awareness of wheelchair positioning close to EOB.    Ambulation/Gait               General Gait Details: pt Education officer, museum mobility: Yes Wheelchair propulsion: Both upper extremities Wheelchair parts: Supervision/cueing Distance: 300 Wheelchair Assistance Details (indicate cue type and reason): cues for technique, challenged pt maneuvering with weaving task, pt reports WC L wheel is not stable and wheel appears to be wobbling on axel but still rotating, but requires more effort on L side to maintain forward momentum due to wheel condition  Modified Rankin (Stroke Patients Only)       Balance Overall balance assessment:  Needs assistance Sitting-balance support: No upper extremity supported Sitting balance-Leahy Scale: Good Sitting balance - Comments: pt able  to don/doff sock with anterior leans and no LOB   Standing balance support: During functional activity, Bilateral upper extremity supported Standing balance-Leahy Scale: Poor Standing balance comment: UE assist would be crucial                            Cognition Arousal/Alertness: Awake/alert Behavior During Therapy: WFL for tasks assessed/performed Overall Cognitive Status: No family/caregiver present to determine baseline cognitive functioning Area of Impairment: Attention, Memory, Following commands, Safety/judgement, Awareness, Problem solving                   Current Attention Level: Selective Memory: Decreased short-term memory Following Commands: Follows one step commands with increased time, Follows multi-step commands inconsistently Safety/Judgement: Decreased awareness of safety Awareness: Emergent Problem Solving: Slow processing, Difficulty sequencing, Requires verbal cues, Requires tactile cues General Comments: pt pleasantly cooperative, reporting needing a power chair at DC. Can be tangential.        Exercises Other Exercises Other Exercises: reviewed supine/seated exercises, pt deferring to perform bed-level therex this date Other Exercises: wheelchair navigation billed as TE for BUE strengthening    General Comments General comments (skin integrity, edema, etc.): BP 128/85 (98) seated EOB post-exertion; SpO2 98% on RA, HR 97 mid-exercise      Pertinent Vitals/Pain Pain Assessment Pain Assessment: Faces Faces Pain Scale: Hurts a little bit Pain Location: L foot/ankle Pain Descriptors / Indicators: Sore, Guarding Pain Intervention(s): Monitored during session, Repositioned           PT Goals (current goals can now be found in the care plan section) Acute Rehab PT Goals Patient Stated Goal: get a power chair PT Goal Formulation: With patient Time For Goal Achievement: 09/04/22 Progress towards PT goals: Progressing toward goals     Frequency    Min 2X/week      PT Plan Current plan remains appropriate       AM-PAC PT "6 Clicks" Mobility   Outcome Measure  Help needed turning from your back to your side while in a flat bed without using bedrails?: None Help needed moving from lying on your back to sitting on the side of a flat bed without using bedrails?: None Help needed moving to and from a bed to a chair (including a wheelchair)?: A Little Help needed standing up from a chair using your arms (e.g., wheelchair or bedside chair)?: A Little Help needed to walk in hospital room?: A Lot Help needed climbing 3-5 steps with a railing? : Total 6 Click Score: 17    End of Session Equipment Utilized During Treatment: Gait belt Activity Tolerance: Patient tolerated treatment well Patient left: in bed;with call bell/phone within reach;with bed alarm set Nurse Communication: Mobility status PT Visit Diagnosis: Other abnormalities of gait and mobility (R26.89)     Time: 0962-8366 PT Time Calculation (min) (ACUTE ONLY): 23 min  Charges:  $Therapeutic Exercise: 8-22 mins $Therapeutic Activity: 8-22 mins                     Raelea Gosse P., PTA Acute Rehabilitation Services Secure Chat Preferred 9a-5:30pm Office: [phone]    Kara Pacer [facility] 09/03/2022, 11:38 AM

## 2022-09-03 NOTE — Progress Notes (Signed)
PROGRESS NOTE        PATIENT DETAILS Name: [name] Age: 56 y.o. Sex: male Date of Birth: 1966/09/23 Admit Date: 08/16/2022 Admitting Physician Clance Boll, MD [id number], [name], NP  Brief Summary:   Patient is a 56 y.o.  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  He was subsequently started on anticoagulation and admitted to the hospitalist service.  Further hospital course was complicated by patient having suicidal ideation-psychiatry was consulted-bedside sitter was placed-with recommendations to pursue inpatient psychiatric admission.  Later patient was cleared by psychiatry, no indication for inpatient psych admission, social worker assisting with SNF placement.  Significant events: 5/4-5/12>> hospitalization for sepsis from cellulitis from chronic lower extremity wound. 8/12-8/25>> hospitalization for HFrEF exacerbation/hypoxia 9/16>> admit to [facility]-pleuritic chest pain/shortness of breath-CTA with bilateral PE and pulmonary infarct.  Significant studies: 9/16>> CTA chest: Bilateral PE-right lung infarct. 9/16>> bilateral lower extremity angiogram: No major stenosis. 9/17>> x-ray left ankle: No fracture/dislocation. 9/17>> UDS: Positive for opiates/cocaine 9/18>> lower extremity Doppler: No DVT 9/18>> Limited echo: YF<74%, RV systolic function moderately reduced. 9/20>> CXR: Slight worsening of peripheral density in the right mid/lower lung.  Significant microbiology data: None  Procedures:  None  Consults:  Psychiatry  Subjective:    Patient denies any headache, chest pain or shortness of breath, no new focal deficits     Objective: Vitals: Blood pressure 115/79, pulse 67, temperature 98 F (36.7 C), temperature source Oral, resp. rate 16, height 5\' 9"  (1.753 m), weight 86.2 kg, SpO2 99 %.   Exam:  Awake Alert, Oriented X 3, No new F.N deficits, Normal affect Symmetrical Chest wall movement, Good air movement bilaterally, CTAB RRR,No Gallops,Rubs or new Murmurs, No Parasternal Heave +ve B.Sounds, Abd Soft, No tenderness, No rebound - guarding or rigidity. Right AKA-left great toe amputation.    Assessment/Plan:  Pulmonary embolism with right lung infarct:  - Pleuritic pain improving-continue Eliquis.  Etiology of VTE likely due to noncompliance.  Given that he has had VTE in the past-needs indefinite anticoagulation.   Chronic HFpEF/HFrEF:  Euvolemic-continue Lasix/Coreg/lisinopril.  If compliant with this regimen-could be switched to Entresto at some point.  Follow with cardiology in the outpatient setting.   Cough:  Suspect from GERD/lung infarct-although at risk for aspiration-do not think he has pneumonia-he may have chemical pneumonitis-Augmentin discontinued on 9/21.  Suicidal ideation: Appreciate psych input - now stable, cleared by psych.  HTN - start Norvasc  History of bipolar disorder: Continue Abilify.   History of CVA with right hemiparesis/facial droop: On anticoagulation  Peripheral neuropathy: Continues to have intermittent mostly left lower extremity pain-continue scheduled Neurontin-and as needed oxycodone for breakthrough pain.  DM-2 (A1c 6.0 on 9/17): CBG stable with SSI.  Will liberalize his diet and see what happens to his sugars.  Follow-up closely as he is noncompliant with diet and now does not want carb modified diet anymore.  May require addition of long-acting insulin.  Recent Labs    09/02/22 2345 09/03/22 0413 09/03/22 0827  GLUCAP 108* 160* 155*    History of right AKA-left great toe amputation  COPD: Not in exacerbation-continue  Brovana/Yupelri/budesonide- will convert  to inhaler regimen when closer to discharge.  Polysubstance abuse-cocaine: Counseled  Homelessness: Very vague about his situation-claims he sleeps wherever he can-eats whatever he can get-claims he is on a wheelchair (right AKA/right hemiparesis).  Social worker following.  BMI: Estimated body mass index is 28.06 kg/m as calculated from the following:   Height as of this encounter: 5\' 9"  (1.753 m).   Weight as of this encounter: 86.2 kg.   Code status:   Code Status: Full Code   DVT Prophylaxis: apixaban (ELIQUIS) tablet 5 mg    Family Communication: None at bedside  Disposition Plan: Status is: Inpatient Medically stable await safe disposition, TOC working on it.  Diet: Diet Order             Diet regular Room service appropriate? Yes; Fluid consistency: Thin  Diet effective now                    MEDICATIONS: Scheduled Meds:  amLODipine  10 mg Oral Daily   apixaban  5 mg Oral BID   arformoterol  15 mcg Nebulization BID   ARIPiprazole  10 mg Oral Daily   budesonide (PULMICORT) nebulizer solution  0.25 mg Nebulization BID   carvedilol  3.125 mg Oral BID WC   escitalopram  5 mg Oral Daily   folic acid  1 mg Oral Daily   furosemide  40 mg Oral BID   gabapentin  400 mg Oral TID   insulin aspart  0-9 Units Subcutaneous TID WC   lisinopril  10 mg Oral Daily   revefenacin  175 mcg Nebulization Daily   thiamine  100 mg Oral Daily   Continuous Infusions: PRN Meds:.hydrALAZINE, oxyCODONE   I have personally reviewed following labs and imaging studies  LABORATORY DATA:  Recent Labs  Lab 08/28/22 0458 09/01/22 0107  WBC 5.8 5.3  HGB 12.9* 12.4*  HCT 38.7* 38.5*  PLT 509* 446*  MCV 87.0 89.5  MCH 29.0 28.8  MCHC 33.3 32.2  RDW 16.7* 17.1*  LYMPHSABS 2.4 2.3  MONOABS 0.8 0.7  EOSABS 0.1 0.1  BASOSABS 0.1 0.0    Recent Labs  Lab 08/28/22 0458 09/01/22 0107  NA 137 139  K 4.3 4.0  CL 98 106  CO2 24 26   GLUCOSE 95 95  BUN 22* 16  CREATININE 0.98 0.88  CALCIUM 9.9 9.3  MG 2.1 1.9    RADIOLOGY STUDIES/RESULTS: No results found.   LOS: 17 days   Signature  11/01/22 M.D on 09/03/2022 at 10:42 AM   -  To page go to [email or web address]

## 2022-09-03 NOTE — Progress Notes (Signed)
Mobility Specialist Progress Note:   09/03/22 0925  Mobility  Activity Ambulated with assistance in room  Activity Response Tolerated well  Distance Ambulated (ft) 15 ft  $Mobility charge 1 Mobility  Level of Assistance Contact guard assist, steadying assist  Assistive Device Front wheel walker  Mobility Referral Yes   Pt eager for mobility session this am. Required close min guard assist for safety while ambulating with RW. Distance limited d/t poor endurance. Left in bed with all needs met, RN present.   Nelta Numbers [facility] Re[facility] Secure Chat or Office Phone: [phone]

## 2022-09-04 LAB — GLUCOSE, CAPILLARY
Glucose-Capillary: 163 mg/dL — ABNORMAL HIGH (ref 70–99)
Glucose-Capillary: 166 mg/dL — ABNORMAL HIGH (ref 70–99)
Glucose-Capillary: 82 mg/dL (ref 70–99)

## 2022-09-04 NOTE — Progress Notes (Signed)
PROGRESS NOTE        PATIENT DETAILS Name: [name] Age: 56 y.o. Sex: male Date of Birth: [birth year] Admit Date: 08/16/2022 Admitting Physician Lurline Del, MD [id number], Chales Abrahams, NP  Brief Summary:   Patient is a 56 y.o.  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  He was subsequently started on anticoagulation and admitted to the hospitalist service.  Further hospital course was complicated by patient having suicidal ideation-psychiatry was consulted-bedside sitter was placed-with recommendations to pursue inpatient psychiatric admission.  Later patient was cleared by psychiatry, no indication for inpatient psych admission, social worker assisting with SNF placement.  Significant events: 5/4-5/12>> hospitalization for sepsis from cellulitis from chronic lower extremity wound. 8/12-8/25>> hospitalization for HFrEF exacerbation/hypoxia 9/16>> admit to [facility]-pleuritic chest pain/shortness of breath-CTA with bilateral PE and pulmonary infarct.  Significant studies: 9/16>> CTA chest: Bilateral PE-right lung infarct. 9/16>> bilateral lower extremity angiogram: No major stenosis. 9/17>> x-ray left ankle: No fracture/dislocation. 9/17>> UDS: Positive for opiates/cocaine 9/18>> lower extremity Doppler: No DVT 9/18>> Limited echo: EF<20%, RV systolic function moderately reduced. 9/20>> CXR: Slight worsening of peripheral density in the right mid/lower lung.  Significant microbiology data: None  Procedures:  None  Consults:  Psychiatry  Subjective:    No significant events overnight, he denies any complaints today    Objective: Vitals: Blood pressure 126/77,  pulse 74, temperature 98 F (36.7 C), temperature source Oral, resp. rate 18, height 5\' 9"  (1.753 m), weight 86.2 kg, SpO2 100 %.   Exam:  Awake Alert, Oriented X 3, No new F.N deficits, Normal affect Symmetrical Chest wall movement, Good air movement bilaterally, CTAB RRR,No Gallops,Rubs or new Murmurs, No Parasternal Heave +ve B.Sounds, Abd Soft, No tenderness, No rebound - guarding or rigidity. Right AKA-left great toe amputation.    Assessment/Plan:  Pulmonary embolism with right lung infarct:  - Pleuritic pain improving-continue Eliquis.  Etiology of VTE likely due to noncompliance.  Given that he has had VTE in the past-needs indefinite anticoagulation.   Chronic HFpEF/HFrEF:  Euvolemic-continue Lasix/Coreg/lisinopril.  If compliant with this regimen-could be switched to Entresto at some point.  Follow with cardiology in the outpatient setting.   Cough:  Suspect from GERD/lung infarct - resoved  Suicidal ideation: Appreciate psych input - now stable, cleared by psych.  HTN - start Norvasc  History of bipolar disorder: Continue Abilify.   History of CVA with right hemiparesis/facial droop: On anticoagulation  Peripheral neuropathy: Continues to have intermittent mostly left lower extremity pain-continue scheduled Neurontin-and as needed oxycodone for breakthrough pain.  DM-2 (A1c 6.0 on 9/17): CBG stable with SSI.  Will liberalize his diet and see what happens to his sugars.  Follow-up closely as he is noncompliant with diet and now does not want carb modified diet anymore.  May require addition of long-acting insulin.  Recent Labs    09/03/22 2124 09/04/22 0809 09/04/22 1254  GLUCAP 112* 163* 166*    History of right AKA-left great toe amputation  COPD: Not in exacerbation-continue Brovana/Yupelri/budesonide- will convert to inhaler regimen when closer to discharge.  Polysubstance abuse-cocaine: Counseled  Homelessness: Very vague about his situation-claims he  sleeps wherever he can-eats whatever he can get-claims he is on a wheelchair (right AKA/right hemiparesis).  Social worker following.  BMI: Estimated body mass index is 28.06 kg/m as calculated from the following:   Height as of this encounter: 5\' 9"  (1.753 m).   Weight as of this encounter: 86.2 kg.   Code status:   Code Status: Full Code   DVT Prophylaxis: apixaban (ELIQUIS) tablet 5 mg    Family Communication: None at bedside  Disposition Plan: Status is: Inpatient Medically stable await safe disposition, TOC working on it.  Diet: Diet Order             Diet regular Room service appropriate? Yes; Fluid consistency: Thin  Diet effective now                    MEDICATIONS: Scheduled Meds:  amLODipine  10 mg Oral Daily   apixaban  5 mg Oral BID   arformoterol  15 mcg Nebulization BID   ARIPiprazole  10 mg Oral Daily   budesonide (PULMICORT) nebulizer solution  0.25 mg Nebulization BID   carvedilol  3.125 mg Oral BID WC   escitalopram  5 mg Oral Daily   folic acid  1 mg Oral Daily   furosemide  40 mg Oral BID   gabapentin  400 mg Oral TID   insulin aspart  0-9 Units Subcutaneous TID WC   lisinopril  10 mg Oral Daily   revefenacin  175 mcg Nebulization Daily   thiamine  100 mg Oral Daily   Continuous Infusions: PRN Meds:.hydrALAZINE, oxyCODONE   I have personally reviewed following labs and imaging studies  LABORATORY DATA:  Recent Labs  Lab 09/01/22 0107  WBC 5.3  HGB 12.4*  HCT 38.5*  PLT 446*  MCV 89.5  MCH 28.8  MCHC 32.2  RDW 17.1*  LYMPHSABS 2.3  MONOABS 0.7  EOSABS 0.1  BASOSABS 0.0    Recent Labs  Lab 09/01/22 0107  NA 139  K 4.0  CL 106  CO2 26  GLUCOSE 95  BUN 16  CREATININE 0.88  CALCIUM 9.3  MG 1.9    RADIOLOGY STUDIES/RESULTS: No results found.   LOS: 18 days   Signature  Phillips Climes M.D on 09/04/2022 at 2:01 PM   -  To page go to [email or web address]

## 2022-09-04 NOTE — Progress Notes (Signed)
Mobility Specialist Progress Note:   09/04/22 0940  Mobility  Activity Ambulated with assistance in room  Activity Response Tolerated well  Distance Ambulated (ft) 20 ft  $Mobility charge 1 Mobility  Level of Assistance Contact guard assist, steadying assist  Assistive Device Front wheel walker;Wheelchair  Mobility Referral Yes   Pt eager for mobility session this am. Required no physical assist, only minG for safety. Pt required x1 seated rest break d/t fatigue. Transferred to w/c at EOS to go around unit independently.   Nelta Numbers Acute Rehab Secure Chat or Office Phone: [phone]

## 2022-09-05 LAB — GLUCOSE, CAPILLARY
Glucose-Capillary: 139 mg/dL — ABNORMAL HIGH (ref 70–99)
Glucose-Capillary: 90 mg/dL (ref 70–99)
Glucose-Capillary: 130 mg/dL — ABNORMAL HIGH (ref 70–99)
Glucose-Capillary: 98 mg/dL (ref 70–99)

## 2022-09-05 NOTE — Plan of Care (Signed)

## 2022-09-05 NOTE — Consult Note (Addendum)
[employer] Nurse Consult Note: Reason for Consult: Consult requested for left ankle.  Performed remotely after review of progress notes and photos in the EMR.   Pt is familiar to [facility] team from previous admissions.  He has a chronic full thickness wound to left outer ankle, 50% red, 50% yellow and moist. Plan: Topical treatment orders provided for bedside nurses to perform as follows to absorb drainage and promote healing: Apply  a piece of calcium alginate Kellie Simmering # [phone]) to left ankle wound Q day, then cover with foam dressing. Moisten previous dressing with NS to remove. (Change foam dressing Q 3 days or PRN soiling.) Please re-consult if further assistance is needed.  Thank-you,  Julien Girt MSN, [place], [place], [place], [place]

## 2022-09-05 NOTE — Progress Notes (Signed)
Mobility Specialist Progress Note:   09/05/22 1115  Mobility  Activity  (self-propel in w/c)  Activity Response Tolerated well  Distance Ambulated (ft) 800 ft  $Mobility charge 1 Mobility  Level of Assistance Standby assist, set-up cues, supervision of patient - no hands on  Assistive Device Wheelchair   Pt eager for mobility. Transferred to w/c with supervision, no physical assist required. Pt back in room, declining transfer back to bed d/t still wanting to move around. All needs met.   Nelta Numbers Acute Rehab Secure Chat or Office Phone: [phone]

## 2022-09-05 NOTE — Plan of Care (Signed)
  Problem: Clinical Measurements: Goal: Will remain free from infection Outcome: Progressing   Problem: Clinical Measurements: Goal: Diagnostic test results will improve Outcome: Progressing   Problem: Activity: Goal: Risk for activity intolerance will decrease Outcome: Progressing   Problem: Nutrition: Goal: Adequate nutrition will be maintained Outcome: Progressing   

## 2022-09-06 LAB — GLUCOSE, CAPILLARY
Glucose-Capillary: 87 mg/dL (ref 70–99)
Glucose-Capillary: 105 mg/dL — ABNORMAL HIGH (ref 70–99)
Glucose-Capillary: 129 mg/dL — ABNORMAL HIGH (ref 70–99)
Glucose-Capillary: 129 mg/dL — ABNORMAL HIGH (ref 70–99)

## 2022-09-06 NOTE — Progress Notes (Signed)
Notified Irene Pap, DO that pt is refusing to answer his orientation questions and patient verification question: Name and date of birth, to receive pain medication. Patient stated "Why are you asking me these questions. You just need to do what needs to be done and that's it". Education provided to pt about indication for assessment questions. Pt refused education.

## 2022-09-06 NOTE — Progress Notes (Signed)
PROGRESS NOTE        PATIENT DETAILS Name: [name] Age: 56 y.o. Sex: male Date of Birth: November 23, 1966 Admit Date: 08/16/2022 Admitting Physician Lurline Del, MD [id number], Chales Abrahams, NP  Brief Summary:   Patient is a 57 y.o.  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  He was subsequently started on anticoagulation and admitted to the hospitalist service.  Further hospital course was complicated by patient having suicidal ideation-psychiatry was consulted-bedside sitter was placed-with recommendations to pursue inpatient psychiatric admission.  Later patient was cleared by psychiatry, no indication for inpatient psych admission, social worker assisting with SNF placement.  Significant events: 5/4-5/12>> hospitalization for sepsis from cellulitis from chronic lower extremity wound. 8/12-8/25>> hospitalization for HFrEF exacerbation/hypoxia 9/16>> admit to [facility]-pleuritic chest pain/shortness of breath-CTA with bilateral PE and pulmonary infarct.  Significant studies: 9/16>> CTA chest: Bilateral PE-right lung infarct. 9/16>> bilateral lower extremity angiogram: No major stenosis. 9/17>> x-ray left ankle: No fracture/dislocation. 9/17>> UDS: Positive for opiates/cocaine 9/18>> lower extremity Doppler: No DVT 9/18>> Limited echo: EF<20%, RV systolic function moderately reduced. 9/20>> CXR: Slight worsening of peripheral density in the right mid/lower lung.  Significant microbiology data: None  Procedures:  None  Consults:  Psychiatry  Subjective:    No significant events overnight, he denies any complaints.   Objective: Vitals: Blood pressure (!) 144/99,  pulse 67, temperature 97.7 F (36.5 C), temperature source Oral, resp. rate 18, height 5\' 9"  (1.753 m), weight 86.2 kg, SpO2 96 %.   Exam:  Awake Alert, Oriented X 3, No new F.N deficits, Normal affect Symmetrical Chest wall movement, Good air movement bilaterally, CTAB RRR,No Gallops,Rubs or new Murmurs, No Parasternal Heave +ve B.Sounds, Abd Soft, No tenderness, No rebound - guarding or rigidity.  Right AKA-left great toe amputation.    Assessment/Plan:  Pulmonary embolism with right lung infarct:  - Pleuritic pain improving-continue Eliquis.  Etiology of VTE likely due to noncompliance.  Given that he has had VTE in the past-needs indefinite anticoagulation.   Chronic HFpEF/HFrEF:  Euvolemic-continue Lasix/Coreg/lisinopril.  If compliant with this regimen-could be switched to Entresto at some point.  Follow with cardiology in the outpatient setting.   Cough:  Suspect from GERD/lung infarct - resoved  Suicidal ideation: Appreciate psych input - now stable, cleared by psych.  HTN - start Norvasc  History of bipolar disorder: Continue Abilify.   History of CVA with right hemiparesis/facial droop: On anticoagulation  Peripheral neuropathy: Continues to have intermittent mostly left lower extremity pain-continue scheduled Neurontin-and as needed oxycodone for breakthrough pain.  DM-2 (A1c 6.0 on 9/17): CBG stable with SSI.  Will liberalize his diet and see what happens to his sugars.  Follow-up closely as he is noncompliant with diet and now does not want carb modified diet anymore.  May require addition of long-acting insulin.  Recent Labs    09/05/22 2106 09/06/22 0756 09/06/22 1120  GLUCAP 98 87 105*    History of right AKA-left great toe amputation  COPD: Not in exacerbation-continue Brovana/Yupelri/budesonide- will convert to inhaler regimen when closer to discharge.  Polysubstance abuse-cocaine: Counseled  Homelessness: Very vague about his situation-claims he  sleeps wherever he can-eats whatever he can get-claims he is on a wheelchair (right AKA/right hemiparesis).  Social worker following.  BMI: Estimated body mass index is 28.06 kg/m as calculated from the following:   Height as of this encounter: 5\' 9"  (1.753 m).   Weight as of this encounter: 86.2 kg.   Code status:   Code Status: Full Code   DVT Prophylaxis: apixaban (ELIQUIS) tablet 5 mg    Family Communication: None at bedside  Disposition Plan: Status is: Inpatient Medically stable await safe disposition, TOC working on it.  Diet: Diet Order             Diet regular Room service appropriate? Yes; Fluid consistency: Thin  Diet effective now                    MEDICATIONS: Scheduled Meds:  amLODipine  10 mg Oral Daily   apixaban  5 mg Oral BID   arformoterol  15 mcg Nebulization BID   ARIPiprazole  10 mg Oral Daily   budesonide (PULMICORT) nebulizer solution  0.25 mg Nebulization BID   carvedilol  3.125 mg Oral BID WC   escitalopram  5 mg Oral Daily   folic acid  1 mg Oral Daily   furosemide  40 mg Oral BID   gabapentin  400 mg Oral TID   insulin aspart  0-9 Units Subcutaneous TID WC   lisinopril  10 mg Oral Daily   revefenacin  175 mcg Nebulization Daily   thiamine  100 mg Oral Daily   Continuous Infusions: PRN Meds:.hydrALAZINE, oxyCODONE   I have personally reviewed following labs and imaging studies  LABORATORY DATA:  Recent Labs  Lab 09/01/22 0107  WBC 5.3  HGB 12.4*  HCT 38.5*  PLT 446*  MCV 89.5  MCH 28.8  MCHC 32.2  RDW 17.1*  LYMPHSABS 2.3  MONOABS 0.7  EOSABS 0.1  BASOSABS 0.0    Recent Labs  Lab 09/01/22 0107  NA 139  K 4.0  CL 106  CO2 26  GLUCOSE 95  BUN 16  CREATININE 0.88  CALCIUM 9.3  MG 1.9    RADIOLOGY STUDIES/RESULTS: No results found.   LOS: 20 days   Signature  Phillips Climes M.D on 09/06/2022 at 2:38 PM   -  To page go to [email or web address]

## 2022-09-07 LAB — GLUCOSE, CAPILLARY
Glucose-Capillary: 108 mg/dL — ABNORMAL HIGH (ref 70–99)
Glucose-Capillary: 138 mg/dL — ABNORMAL HIGH (ref 70–99)
Glucose-Capillary: 105 mg/dL — ABNORMAL HIGH (ref 70–99)
Glucose-Capillary: 98 mg/dL (ref 70–99)

## 2022-09-07 NOTE — Progress Notes (Signed)
PROGRESS NOTE        PATIENT DETAILS Name: [name] Age: 56 y.o. Sex: male Date of Birth: 1965/12/21 Admit Date: 08/16/2022 Admitting Physician Lurline Del, MD DDU:[id number], Chales Abrahams, NP  Brief Summary:    Patient is a 56 y.o.  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  He was subsequently started on anticoagulation and admitted to the hospitalist service.  Further hospital course was complicated by patient having suicidal ideation-psychiatry was consulted-bedside sitter was placed-with recommendations to pursue inpatient psychiatric admission.  Later patient was cleared by psychiatry, no indication for inpatient psych admission, social worker assisting with SNF placement.  Significant events: 5/4-5/12>> hospitalization for sepsis from cellulitis from chronic lower extremity wound. 8/12-8/25>> hospitalization for HFrEF exacerbation/hypoxia 9/16>> admit to [facility]-pleuritic chest pain/shortness of breath-CTA with bilateral PE and pulmonary infarct.  Significant studies: 9/16>> CTA chest: Bilateral PE-right lung infarct. 9/16>> bilateral lower extremity angiogram: No major stenosis. 9/17>> x-ray left ankle: No fracture/dislocation. 9/17>> UDS: Positive for opiates/cocaine 9/18>> lower extremity Doppler: No DVT 9/18>> Limited echo: EF<20%, RV systolic function moderately reduced. 9/20>> CXR: Slight worsening of peripheral density in the right mid/lower lung.  Significant microbiology data: None  Procedures:  None  Consults:  Psychiatry  Subjective:    No significant events overnight, he denies any complaints.   Objective: Vitals: Blood pressure 94/72, pulse  78, temperature 97.7 F (36.5 C), resp. rate 18, height 5\' 9"  (1.753 m), weight 86.2 kg, SpO2 96 %.   Exam:  Awake Alert, Oriented X 3, No new F.N deficits, Normal affect Symmetrical Chest wall movement, Good air movement bilaterally, CTAB RRR,No Gallops,Rubs or new Murmurs, No Parasternal Heave +ve B.Sounds, Abd Soft, No tenderness, No rebound - guarding or rigidity.  Right AKA-left great toe amputation.    Assessment/Plan:  Pulmonary embolism with right lung infarct:  - Pleuritic pain improving-continue Eliquis.  Etiology of VTE likely due to noncompliance.  Given that he has had VTE in the past-needs indefinite anticoagulation.   Chronic HFpEF/HFrEF:  Euvolemic-continue Lasix/Coreg/lisinopril.  If compliant with this regimen-could be switched to Entresto at some point.  Follow with cardiology in the outpatient setting.   Cough:  Suspect from GERD/lung infarct - resoved  Suicidal ideation: Appreciate psych input - now stable, cleared by psych.  HTN - start Norvasc  History of bipolar disorder: Continue Abilify.   History of CVA with right hemiparesis/facial droop: On anticoagulation  Peripheral neuropathy: Continues to have intermittent mostly left lower extremity pain-continue scheduled Neurontin-and as needed oxycodone for breakthrough pain.  DM-2 (A1c 6.0 on 9/17): CBG stable with SSI.  Will liberalize his diet and see what happens to his sugars.    Recent Labs    09/06/22 1643 09/06/22 2121 09/07/22 0847  GLUCAP 129* 129* 108*    History of right AKA-left great toe amputation  COPD: Not in exacerbation-continue Brovana/Yupelri/budesonide- will convert to inhaler regimen when closer to discharge.  Polysubstance abuse-cocaine: Counseled  Homelessness: Very vague about his situation-claims he sleeps wherever he can-eats whatever he can get-claims he is on a wheelchair (right AKA/right hemiparesis).  Social worker following.  BMI: Estimated body mass index is  28.06 kg/m as calculated from the following:   Height as of this encounter: 5\' 9"  (1.753 m).   Weight as of this encounter: 86.2 kg.   Code status:   Code Status: Full Code   DVT Prophylaxis: apixaban (ELIQUIS) tablet 5 mg    Family Communication: None at bedside  Disposition Plan: Status is: Inpatient Medically stable await safe disposition, TOC working on it.  Diet: Diet Order             Diet regular Room service appropriate? Yes; Fluid consistency: Thin  Diet effective now                    MEDICATIONS: Scheduled Meds:  amLODipine  10 mg Oral Daily   apixaban  5 mg Oral BID   arformoterol  15 mcg Nebulization BID   ARIPiprazole  10 mg Oral Daily   budesonide (PULMICORT) nebulizer solution  0.25 mg Nebulization BID   carvedilol  3.125 mg Oral BID WC   escitalopram  5 mg Oral Daily   folic acid  1 mg Oral Daily   furosemide  40 mg Oral BID   gabapentin  400 mg Oral TID   insulin aspart  0-9 Units Subcutaneous TID WC   lisinopril  10 mg Oral Daily   revefenacin  175 mcg Nebulization Daily   thiamine  100 mg Oral Daily   Continuous Infusions: PRN Meds:.hydrALAZINE, oxyCODONE   I have personally reviewed following labs and imaging studies  LABORATORY DATA:  Recent Labs  Lab 09/01/22 0107  WBC 5.3  HGB 12.4*  HCT 38.5*  PLT 446*  MCV 89.5  MCH 28.8  MCHC 32.2  RDW 17.1*  LYMPHSABS 2.3  MONOABS 0.7  EOSABS 0.1  BASOSABS 0.0    Recent Labs  Lab 09/01/22 0107  NA 139  K 4.0  CL 106  CO2 26  GLUCOSE 95  BUN 16  CREATININE 0.88  CALCIUM 9.3  MG 1.9    RADIOLOGY STUDIES/RESULTS: No results found.   LOS: 21 days   Signature  Phillips Climes M.D on 09/07/2022 at 12:25 PM   -  To page go to [email or web address]

## 2022-09-08 LAB — GLUCOSE, CAPILLARY
Glucose-Capillary: 113 mg/dL — ABNORMAL HIGH (ref 70–99)
Glucose-Capillary: 93 mg/dL (ref 70–99)
Glucose-Capillary: 119 mg/dL — ABNORMAL HIGH (ref 70–99)
Glucose-Capillary: 119 mg/dL — ABNORMAL HIGH (ref 70–99)

## 2022-09-08 NOTE — Progress Notes (Signed)
PROGRESS NOTE        PATIENT DETAILS Name: [name] Age: 56 y.o. Sex: male Date of Birth: [birth year] Admit Date: 08/16/2022 Admitting Physician Clance Boll, MD IFO:[id number], [name], NP  Brief Summary:   Patient is a 56 y.o.  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  He was subsequently started on anticoagulation and admitted to the hospitalist service.  Further hospital course was complicated by patient having suicidal ideation-psychiatry was consulted-bedside sitter was placed-with recommendations to pursue inpatient psychiatric admission.  Later patient was cleared by psychiatry, no indication for inpatient psych admission, social worker assisting with SNF placement.  Significant events: 5/4-5/12>> hospitalization for sepsis from cellulitis from chronic lower extremity wound. 8/12-8/25>> hospitalization for HFrEF exacerbation/hypoxia 9/16>> admit to [facility]-pleuritic chest pain/shortness of breath-CTA with bilateral PE and pulmonary infarct.  Significant studies: 9/16>> CTA chest: Bilateral PE-right lung infarct. 9/16>> bilateral lower extremity angiogram: No major stenosis. 9/17>> x-ray left ankle: No fracture/dislocation. 9/17>> UDS: Positive for opiates/cocaine 9/18>> lower extremity Doppler: No DVT 9/18>> Limited echo: IN<86%, RV systolic function moderately reduced. 9/20>> CXR: Slight worsening of peripheral density in the right mid/lower lung.  Significant microbiology data: None  Procedures:  None  Consults:  Psychiatry  Subjective:   No major issues overnight-lying comfortably in bed   Objective: Vitals: Blood pressure (!) 144/99, pulse 62,  temperature 97.7 F (36.5 C), temperature source Oral, resp. rate 19, height 5\' 9"  (1.753 m), weight 86.2 kg, SpO2 99 %.   Exam: Gen Exam:Alert awake-not in any distress HEENT:atraumatic, normocephalic Chest: B/L clear to auscultation anteriorly CVS:S1S2 regular Abdomen:soft non tender, non distended Extremities:no edema right AKA, left big toe amputation Neurology: Non focal Skin: no rash    Assessment/Plan: Pulmonary embolism with right lung infarct:  Pleuritic pain has resolved-on Eliquis Etiology likely due to noncompliance Indefinite anticoagulation-given prior episode of VTE  Chronic HFpEF/HFrEF:  Euvolemic-continue Lasix/Coreg/lisinopril. Outpatient cardiology follow-up-if he is compliant with medication-could be switched to Entresto   Cough:  Suspect from GERD/lung infarct Resolved  Suicidal ideation: Appreciate psych input - now stable, cleared by psych.  HTN  Stable-continue amlodipine/Coreg/lisinopril  History of bipolar disorder Continue Abilify.   History of CVA with right hemiparesis/facial droop On anticoagulation  Peripheral neuropathy: Continue Neurontin and as needed oxycodone.   DM-2 (A1c 6.0 on 9/17):  CBG stable with SSI.     Recent Labs    09/07/22 1626 09/07/22 2112 09/08/22 0720  GLUCAP 105* 98 113*     History of right AKA-left great toe amputation  COPD Not in exacerbation-continue Brovana/Yupelri/budesonide- will convert to inhaler regimen when closer to discharge.  Polysubstance abuse-cocaine Counseled  Homelessness:  Very vague about his situation-claims he sleeps wherever he can-eats whatever he can get-claims he is on a wheelchair (right AKA/right hemiparesis).   Social worker following.  BMI: Estimated body mass index is 28.06 kg/m as calculated from the following:   Height as of this encounter: 5\' 9"  (1.753 m).   Weight as of this encounter: 86.2 kg.   Code status:   Code Status: Full Code   DVT  Prophylaxis: apixaban (ELIQUIS) tablet 5 mg  Family Communication: None at bedside  Disposition Plan: Status is: Inpatient Medically stable await safe disposition, TOC working on it.  Diet: Diet Order             Diet regular Room service appropriate? Yes; Fluid consistency: Thin  Diet effective now                    MEDICATIONS: Scheduled Meds:  amLODipine  10 mg Oral Daily   apixaban  5 mg Oral BID   arformoterol  15 mcg Nebulization BID   ARIPiprazole  10 mg Oral Daily   budesonide (PULMICORT) nebulizer solution  0.25 mg Nebulization BID   carvedilol  3.125 mg Oral BID WC   escitalopram  5 mg Oral Daily   folic acid  1 mg Oral Daily   furosemide  40 mg Oral BID   gabapentin  400 mg Oral TID   insulin aspart  0-9 Units Subcutaneous TID WC   lisinopril  10 mg Oral Daily   revefenacin  175 mcg Nebulization Daily   thiamine  100 mg Oral Daily   Continuous Infusions: PRN Meds:.hydrALAZINE, oxyCODONE   I have personally reviewed following labs and imaging studies  LABORATORY DATA:  No results for input(s): "WBC", "HGB", "HCT", "PLT", "MCV", "MCH", "MCHC", "RDW", "LYMPHSABS", "MONOABS", "EOSABS", "BASOSABS", "BANDABS" in the last 168 hours.  Invalid input(s): "NEUTRABS", "BANDSABD"   No results for input(s): "NA", "K", "CL", "CO2", "GLUCOSE", "BUN", "CREATININE", "CALCIUM", "AST", "ALT", "ALKPHOS", "BILITOT", "ALBUMIN", "MG", "PHOS", "CRP", "DDIMER", "PROCALCITON", "LATICACIDVEN", "INR", "TSH", "CORTISOL", "HGBA1C", "AMMONIA", "BNP" in the last 168 hours.  Invalid input(s): "GFRCGP", "ARSCOV2NAA"   RADIOLOGY STUDIES/RESULTS: No results found.   LOS: 22 days   Signature  Jeoffrey Massed M.D on 09/08/2022 at 10:19 AM   -  To page go to [email or web address]

## 2022-09-09 LAB — CBC
HCT: 38.9 % — ABNORMAL LOW (ref 39.0–52.0)
Hemoglobin: 12.5 g/dL — ABNORMAL LOW (ref 13.0–17.0)
MCH: 29 pg (ref 26.0–34.0)
MCHC: 32.1 g/dL (ref 30.0–36.0)
MCV: 90.3 fL (ref 80.0–100.0)
Platelets: 321 10*3/uL (ref 150–400)
RBC: 4.31 MIL/uL (ref 4.22–5.81)
RDW: 17.5 % — ABNORMAL HIGH (ref 11.5–15.5)
WBC: 5.9 10*3/uL (ref 4.0–10.5)
nRBC: 0 % (ref 0.0–0.2)

## 2022-09-09 LAB — BASIC METABOLIC PANEL
Anion gap: 8 (ref 5–15)
BUN: 15 mg/dL (ref 6–20)
CO2: 24 mmol/L (ref 22–32)
Calcium: 9.2 mg/dL (ref 8.9–10.3)
Chloride: 103 mmol/L (ref 98–111)
Creatinine, Ser: 0.82 mg/dL (ref 0.61–1.24)
GFR, Estimated: >60 mL/min (ref >60)
Glucose, Bld: 87 mg/dL (ref 70–99)
Potassium: 3.8 mmol/L (ref 3.5–5.1)
Sodium: 135 mmol/L (ref 135–145)

## 2022-09-09 LAB — GLUCOSE, CAPILLARY
Glucose-Capillary: 80 mg/dL (ref 70–99)
Glucose-Capillary: 114 mg/dL — ABNORMAL HIGH (ref 70–99)
Glucose-Capillary: 89 mg/dL (ref 70–99)
Glucose-Capillary: 137 mg/dL — ABNORMAL HIGH (ref 70–99)

## 2022-09-09 NOTE — Progress Notes (Addendum)
Physical Therapy Treatment Patient Details Name: [name] MRN: [id number] DOB: June 28, 1966 Today's Date: 09/09/2022   History of Present Illness Pt is a 56yo M who presented 9/16 with c/o chest pain, SOB, and leg swelling. Found to have B PE. PMH includes asthma, bioplar 1, depression, schizoaffective disorder, CVA, chronic CHF, homelessness, HTN, DM2, cocaine and cannabis use and R AKA.    PT Comments    Pt goals updated based on progress. Session focused on progressive ambulation. Pt ambulating a total of ~64 ft with a RW and w/c follow; required 4 seated rest breaks. Continues to display decreased cardiopulmonary endurance, weakness, and impaired standing balance. Will continue to follow acutely to address deficits.   Recommendations for follow up therapy are one component of a multi-disciplinary discharge planning process, led by the attending physician.  Recommendations may be updated based on patient status, additional functional criteria and insurance authorization.  Follow Up Recommendations  Skilled nursing-short term rehab (<3 hours/day) Can patient physically be transported by private vehicle: Yes   Assistance Recommended at Discharge Set up Supervision/Assistance  Patient can return home with the following A little help with walking and/or transfers;A little help with bathing/dressing/bathroom   Equipment Recommendations  Wheelchair (measurements PT);Wheelchair cushion (measurements PT)    Recommendations for Other Services       Precautions / Restrictions Precautions Precautions: Fall Restrictions Weight Bearing Restrictions: No     Mobility  Bed Mobility Overal bed mobility: Modified Independent                  Transfers Overall transfer level: Needs assistance Equipment used: Rolling walker (2 wheels), None Transfers: Sit to/from Stand, Bed to chair/wheelchair/BSC Sit to Stand: Min guard     Squat pivot transfers: Supervision     General  transfer comment: Min guard for safety to rise to stand from edge of bed and w/c. Supervision for squat pivot transfer from w/c to bed towards left    Ambulation/Gait Ambulation/Gait assistance: Min guard Gait Distance (Feet): 64 Feet (4 total rest breaks) Assistive device: Rolling walker (2 wheels) Gait Pattern/deviations: Step-to pattern Gait velocity: decreased     General Gait Details: Hop to pattern, min guard for safety and w/c follow utilized   Theme park manager mobility: Yes Wheelchair propulsion: Both upper extremities Wheelchair parts: Independent Distance: 40  Modified Rankin (Stroke Patients Only)       Balance Overall balance assessment: Needs assistance Sitting-balance support: No upper extremity supported Sitting balance-Leahy Scale: Good     Standing balance support: During functional activity, Bilateral upper extremity supported Standing balance-Leahy Scale: Poor                              Cognition Arousal/Alertness: Awake/alert Behavior During Therapy: WFL for tasks assessed/performed Overall Cognitive Status: No family/caregiver present to determine baseline cognitive functioning                                 General Comments: eager to participate in therapy and following all commands. demonstrates fair safety awareness, locking w/c brakes prior to standing.        Exercises      General Comments General comments (skin integrity, edema, etc.): VSS on RA      Pertinent Vitals/Pain Pain Assessment Pain Assessment: Faces Faces  Pain Scale: No hurt    Home Living                          Prior Function            PT Goals (current goals can now be found in the care plan section) Acute Rehab PT Goals Time For Goal Achievement: 09/23/22 Potential to Achieve Goals: Good Progress towards PT goals: Progressing toward goals     Frequency    Min 2X/week      PT Plan Current plan remains appropriate    Co-evaluation              AM-PAC PT "6 Clicks" Mobility   Outcome Measure  Help needed turning from your back to your side while in a flat bed without using bedrails?: None Help needed moving from lying on your back to sitting on the side of a flat bed without using bedrails?: None Help needed moving to and from a bed to a chair (including a wheelchair)?: A Little Help needed standing up from a chair using your arms (e.g., wheelchair or bedside chair)?: A Little Help needed to walk in hospital room?: A Little Help needed climbing 3-5 steps with a railing? : A Lot 6 Click Score: 19    End of Session Equipment Utilized During Treatment: Gait belt Activity Tolerance: Patient tolerated treatment well Patient left: in bed;with call bell/phone within reach;with bed alarm set Nurse Communication: Mobility status PT Visit Diagnosis: Other abnormalities of gait and mobility (R26.89)     Time: 1245-8099 PT Time Calculation (min) (ACUTE ONLY): 23 min  Charges:  $Therapeutic Activity: 23-37 mins                     Lillia Pauls, PT, DPT Acute Rehabilitation Services Office [phone]    Norval Morton 09/09/2022, 3:25 PM

## 2022-09-09 NOTE — TOC Progression Note (Signed)
Transition of Care [facility]) - Progression Note    Patient Details  Name: [name] MRN: [id number] Date of Birth: Jan 13, 1966  Transition of Care [facility] CM/SW [place], [name] Phone Number: 09/09/2022, 1:47 PM  Clinical Narrative:    CSW received call from [place] worker, Ms. Tim Lair [phone]), stating that she had spoken with the patient and he is hopeful for placement at a drug rehab facility. CSW faxed referral to [facility] for review and they reported they will call CSW back hopefully by the end of today with a determination.    Expected Discharge Plan: Skilled Nursing Facility Barriers to Discharge: [place] (PASRR), Insurance Authorization, SNF Pending bed offer, Active Substance Use - Placement, Homeless with medical needs  Expected Discharge Plan and Services Expected Discharge Plan: [place] In-house Referral: Clinical Social Work   Post Acute Care Choice: [place] Living arrangements for the past 2 months: Homeless                                       Social Determinants of Health (SDOH) Interventions    Readmission Risk Interventions    08/18/2022    9:00 AM 07/18/2022    4:11 PM 04/08/2022    1:43 PM  Readmission Risk Prevention Plan  Transportation Screening Complete Complete Complete  Medication Review Press photographer) Complete Complete Complete  PCP or Specialist appointment within 3-5 days of discharge Complete Complete Complete  HRI or Home Care Consult Complete Complete Not Complete  HRI or Home Care Consult Pt Refusal Comments   Pt is homeless  SW Recovery Care/Counseling Consult Complete Complete Complete  Palliative Care Screening Not Applicable  Not [place] Not Applicable Complete Not Applicable

## 2022-09-09 NOTE — Progress Notes (Signed)
Occupational Therapy Treatment Patient Details Name: [name] MRN: [id number] DOB: 10-05-1966 Today's Date: 09/09/2022   History of present illness Pt is a 56yo M who presented 9/16 with c/o chest pain, SOB, and leg swelling. Found to have B PE. PMH includes asthma, bioplar 1, depression, schizoaffective disorder, CVA, chronic CHF, homelessness, HTN, DM2, cocaine and cannabis use and R AKA.   OT comments  Pt progressing towards goals needing supervision for UB and LB dressing tasks, and squat pivot transfer from w/c to toilet with use of grab bar. Pt with difficulty performing fine motor task (buttoning) due to ataxic movements in RUE. Pt reporting phantom pain/sensations in RLE, educated on sensory strategies to use to reduce pain/sensations, pt verbalized understanding, able to demo. Pt presenting with impairments listed below, will continue to follow.   Recommendations for follow up therapy are one component of a multi-disciplinary discharge planning process, led by the attending physician.  Recommendations may be updated based on patient status, additional functional criteria and insurance authorization.    Follow Up Recommendations  Skilled nursing-short term rehab (<3 hours/day)    Assistance Recommended at Discharge Frequent or constant Supervision/Assistance  Patient can return home with the following  A little help with walking and/or transfers;A little help with bathing/dressing/bathroom   Equipment Recommendations  Wheelchair (measurements OT)    Recommendations for Other Services PT consult    Precautions / Restrictions Precautions Precautions: Fall Restrictions Weight Bearing Restrictions: No       Mobility Bed Mobility               General bed mobility comments: seated EOB upon arrival and departure    Transfers Overall transfer level: Needs assistance Equipment used: None Transfers: Bed to chair/wheelchair/BSC Sit to Stand: Supervision   Squat  pivot transfers: Supervision       General transfer comment: squat pivot transfer from EOB to wheelchair and wheelchair to toilet     Balance Overall balance assessment: Needs assistance Sitting-balance support: No upper extremity supported Sitting balance-Leahy Scale: Good     Standing balance support: During functional activity, Bilateral upper extremity supported Standing balance-Leahy Scale: Poor                             ADL either performed or assessed with clinical judgement   ADL Overall ADL's : Needs assistance/impaired                 Upper Body Dressing : Supervision/safety;Sitting Upper Body Dressing Details (indicate cue type and reason): donned gown seated on EOB Lower Body Dressing: Supervision/safety Lower Body Dressing Details (indicate cue type and reason): donning sock Toilet Transfer: Supervision/safety;BSC/3in1;Squat-pivot Toilet Transfer Details (indicate cue type and reason): patient able to transfer from wheelchair to toilet with use of rail         Functional mobility during ADLs: Supervision/safety      Extremity/Trunk Assessment Upper Extremity Assessment Upper Extremity Assessment: RUE deficits/detail RUE Deficits / Details: Ataxic RUE from prior CVA. Ataxic during all intentional reaching and AROM at the shoulder limited to 90 degrees. MMT 3+/5 overall distally except hand with Negative composite grip. RUE Sensation: decreased light touch RUE Coordination: decreased gross motor;decreased fine motor   Lower Extremity Assessment Lower Extremity Assessment: Defer to PT evaluation        Vision   Vision Assessment?: No apparent visual deficits   Perception Perception Perception: Not tested   Praxis Praxis Praxis: Not tested  Cognition Arousal/Alertness: Awake/alert Behavior During Therapy: WFL for tasks assessed/performed Overall Cognitive Status: No family/caregiver present to determine baseline cognitive  functioning Area of Impairment: Attention, Memory, Following commands, Safety/judgement, Awareness, Problem solving                     Memory: Decreased short-term memory Following Commands: Follows one step commands consistently   Awareness: Emergent Problem Solving: Slow processing, Difficulty sequencing, Requires verbal cues, Requires tactile cues General Comments: patient eager to participate with therapy and is motivated towards progress        Exercises      Shoulder Instructions       General Comments VSS on RA    Pertinent Vitals/ Pain       Pain Assessment Pain Assessment: Faces Pain Score: 6  Faces Pain Scale: Hurts even more Pain Location: phantom pain RLE and L foot/ankle Pain Descriptors / Indicators: Discomfort Pain Intervention(s): Limited activity within patient's tolerance, Monitored during session, Repositioned  Home Living                                          Prior Functioning/Environment              Frequency  Min 3X/week        Progress Toward Goals  OT Goals(current goals can now be found in the care plan section)  Progress towards OT goals: Progressing toward goals  Acute Rehab OT Goals Patient Stated Goal: none stated OT Goal Formulation: With patient Time For Goal Achievement: 09/18/22 Potential to Achieve Goals: Good ADL Goals Pt Will Perform Upper Body Bathing: Independently;sitting Pt Will Perform Lower Body Bathing: Independently;sit to/from stand Pt Will Perform Upper Body Dressing: Independently;sitting Pt Will Perform Lower Body Dressing: Independently;sitting/lateral leans Pt Will Transfer to Toilet: Independently;squat pivot transfer;regular height toilet Pt Will Perform Toileting - Clothing Manipulation and hygiene: Independently  Plan Discharge plan remains appropriate    Co-evaluation                 AM-PAC OT "6 Clicks" Daily Activity     Outcome Measure   Help from  another person eating meals?: None Help from another person taking care of personal grooming?: None Help from another person toileting, which includes using toliet, bedpan, or urinal?: A Little Help from another person bathing (including washing, rinsing, drying)?: A Little Help from another person to put on and taking off regular upper body clothing?: A Little Help from another person to put on and taking off regular lower body clothing?: A Little 6 Click Score: 20    End of Session Equipment Utilized During Treatment: Other (comment) (w/c)  OT Visit Diagnosis: Unsteadiness on feet (R26.81);Muscle weakness (generalized) (M62.81);Other abnormalities of gait and mobility (R26.89)   Activity Tolerance Patient tolerated treatment well   Patient Left in chair;with call bell/phone within reach   Nurse Communication Mobility status        Time: 1101-1120 OT Time Calculation (min): 19 min  Charges: OT General Charges $OT Visit: 1 Visit OT Treatments $Self Care/Home Management : 8-22 mins  Lynnda Child, OTD, OTR/L Acute Rehab [phone]) [phone] - [place] 09/09/2022, 12:54 PM

## 2022-09-09 NOTE — Progress Notes (Signed)
PROGRESS NOTE        PATIENT DETAILS Name: [name] Age: 56 y.o. Sex: male Date of Birth: [birth year] Admit Date: 08/16/2022 Admitting Physician Clance Boll, MD [id number], [name], NP  Brief Summary:   Patient is a 56 y.o.  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  He was subsequently started on anticoagulation and admitted to the hospitalist service.  Further hospital course was complicated by patient having suicidal ideation-psychiatry was consulted-bedside sitter was placed-with recommendations to pursue inpatient psychiatric admission.  Later patient was cleared by psychiatry, no indication for inpatient psych admission, social worker assisting with SNF placement.  Significant events: 5/4-5/12>> hospitalization for sepsis from cellulitis from chronic lower extremity wound. 8/12-8/25>> hospitalization for HFrEF exacerbation/hypoxia 9/16>> admit to [facility]-pleuritic chest pain/shortness of breath-CTA with bilateral PE and pulmonary infarct.  Significant studies: 9/16>> CTA chest: Bilateral PE-right lung infarct. 9/16>> bilateral lower extremity angiogram: No major stenosis. 9/17>> x-ray left ankle: No fracture/dislocation. 9/17>> UDS: Positive for opiates/cocaine 9/18>> lower extremity Doppler: No DVT 9/18>> Limited echo: AS<50%, RV systolic function moderately reduced. 9/20>> CXR: Slight worsening of peripheral density in the right mid/lower lung.  Significant microbiology data: None  Procedures:  None  Consults:  Psychiatry  Subjective:   No issues overnight-sitting up in bed this morning.   Objective: Vitals: Blood pressure (!) 156/104, pulse 63,  temperature 98 F (36.7 C), resp. rate 18, height 5\' 9"  (1.753 m), weight 86.2 kg, SpO2 98 %.   Exam: Gen Exam:Alert awake-not in any distress HEENT:atraumatic, normocephalic Chest: B/L clear to auscultation anteriorly CVS:S1S2 regular Abdomen:soft non tender, non distended Extremities:no edema right AKA, left big toe amputation Neurology: Non focal Skin: no rash    Assessment/Plan: Pulmonary embolism with right lung infarct:  Pleuritic pain has resolved-on Eliquis Etiology likely due to noncompliance Indefinite anticoagulation-given prior episode of VTE  Chronic HFpEF/HFrEF:  Euvolemic-continue Lasix/Coreg/lisinopril. Outpatient cardiology follow-up-if he is compliant with medication-could be switched to Entresto   Cough:  Suspect from GERD/lung infarct Resolved  Suicidal ideation: Appreciate psych input - now stable, cleared by psych.  HTN  Stable-continue amlodipine/Coreg/lisinopril  History of bipolar disorder Continue Abilify.   History of CVA with right hemiparesis/facial droop On anticoagulation  Peripheral neuropathy: Continue Neurontin and as needed oxycodone.   DM-2 (A1c 6.0 on 9/17):  CBG stable with SSI.     Recent Labs    09/08/22 1650 09/08/22 2130 09/09/22 0738  GLUCAP 119* 119* 80     History of right AKA-left great toe amputation  COPD Not in exacerbation-continue Brovana/Yupelri/budesonide- will convert to inhaler regimen when closer to discharge.  Polysubstance abuse-cocaine Counseled  Homelessness:  Very vague about his situation-claims he sleeps wherever he can-eats whatever he can get-claims he is on a wheelchair (right AKA/right hemiparesis).   Social worker following.  BMI: Estimated body mass index is 28.06 kg/m as calculated from the following:   Height as of this encounter: 5\' 9"  (1.753 m).   Weight as of this encounter: 86.2 kg.   Code status:   Code Status: Full Code   DVT Prophylaxis: apixaban (ELIQUIS) tablet 5  mg    Family  Communication: None at bedside  Disposition Plan: Status is: Inpatient Medically stable await safe disposition, TOC working on it.  Diet: Diet Order             Diet regular Room service appropriate? Yes; Fluid consistency: Thin  Diet effective now                    MEDICATIONS: Scheduled Meds:  amLODipine  10 mg Oral Daily   apixaban  5 mg Oral BID   arformoterol  15 mcg Nebulization BID   ARIPiprazole  10 mg Oral Daily   budesonide (PULMICORT) nebulizer solution  0.25 mg Nebulization BID   carvedilol  3.125 mg Oral BID WC   escitalopram  5 mg Oral Daily   folic acid  1 mg Oral Daily   furosemide  40 mg Oral BID   gabapentin  400 mg Oral TID   insulin aspart  0-9 Units Subcutaneous TID WC   lisinopril  10 mg Oral Daily   revefenacin  175 mcg Nebulization Daily   thiamine  100 mg Oral Daily   Continuous Infusions: PRN Meds:.hydrALAZINE, oxyCODONE   I have personally reviewed following labs and imaging studies  LABORATORY DATA:  Recent Labs  Lab 09/09/22 0002  WBC 5.9  HGB 12.5*  HCT 38.9*  PLT 321  MCV 90.3  MCH 29.0  MCHC 32.1  RDW 17.5*     Recent Labs  Lab 09/09/22 0640  NA 135  K 3.8  CL 103  CO2 24  GLUCOSE 87  BUN 15  CREATININE 0.82  CALCIUM 9.2     RADIOLOGY STUDIES/RESULTS: No results found.   LOS: 23 days   Signature  Oren Binet M.D on 09/09/2022 at 11:10 AM   -  To page go to [email or web address]

## 2022-09-09 NOTE — Progress Notes (Signed)
Mobility Specialist Progress Note:   09/09/22 1100  Mobility  Activity Transferred from bed to chair (self-propelled w/c around unit)  Level of Assistance Standby assist, set-up cues, supervision of patient - no hands on  Assistive Device Wheelchair  Distance Ambulated (ft) 1000 ft  Activity Response Tolerated well  Mobility Referral Yes  $Mobility charge 1 Mobility   Pt eager for mobility session. Declined gait trial with RW, however agreed to transfer to w/c to self-propel. Pt propelled 101f independently. No physical assistance required. Back in room with all needs met.   ANelta NumbersAcute Rehab Secure Chat or Office Phone: [phone]

## 2022-09-10 LAB — GLUCOSE, CAPILLARY
Glucose-Capillary: 116 mg/dL — ABNORMAL HIGH (ref 70–99)
Glucose-Capillary: 95 mg/dL (ref 70–99)
Glucose-Capillary: 92 mg/dL (ref 70–99)
Glucose-Capillary: 114 mg/dL — ABNORMAL HIGH (ref 70–99)

## 2022-09-10 MED ORDER — ORAL CARE MOUTH RINSE: 15.0000 mL | OROMUCOSAL | Status: DC | PRN

## 2022-09-10 NOTE — Progress Notes (Signed)
Pt doesn't want a dressing on ankle.

## 2022-09-10 NOTE — Progress Notes (Signed)
Pt refusing to place dressing on ankle

## 2022-09-10 NOTE — Progress Notes (Signed)
Mobility Specialist Progress Note:   09/10/22 1430  Mobility  Activity Transferred from bed to chair (+ self-propel in w/c)  Level of Assistance Standby assist, set-up cues, supervision of patient - no hands on  Assistive Device Wheelchair  Distance Ambulated (ft) 1000 ft  Activity Response Tolerated well  Mobility Referral Yes  $Mobility charge 1 Mobility   Pt eager for mobility session, however declining gait training with RW at this time. Pt transferred to w/c to no physical assist, then self-propelled in hallway. Pt back in room eating lunch.   Nelta Numbers Acute Rehab Secure Chat or Office Phone: [phone]

## 2022-09-10 NOTE — Progress Notes (Signed)
PROGRESS NOTE        PATIENT DETAILS Name: [name] Age: 56 y.o. Sex: male Date of Birth: 1966-11-20 Admit Date: 08/16/2022 Admitting Physician Lurline Del, MD JYN:[id number], Chales Abrahams, NP  Brief Summary:   Patient is a 56 y.o.  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  He was subsequently started on anticoagulation and admitted to the hospitalist service.  Further hospital course was complicated by patient having suicidal ideation-psychiatry was consulted-bedside sitter was placed-with recommendations to pursue inpatient psychiatric admission.  Later patient was cleared by psychiatry, no indication for inpatient psych admission, social worker assisting with SNF placement.  Significant events: 5/4-5/12>> hospitalization for sepsis from cellulitis from chronic lower extremity wound. 8/12-8/25>> hospitalization for HFrEF exacerbation/hypoxia 9/16>> admit to [facility]-pleuritic chest pain/shortness of breath-CTA with bilateral PE and pulmonary infarct.  Significant studies: 9/16>> CTA chest: Bilateral PE-right lung infarct. 9/16>> bilateral lower extremity angiogram: No major stenosis. 9/17>> x-ray left ankle: No fracture/dislocation. 9/17>> UDS: Positive for opiates/cocaine 9/18>> lower extremity Doppler: No DVT 9/18>> Limited echo: EF<20%, RV systolic function moderately reduced. 9/20>> CXR: Slight worsening of peripheral density in the right mid/lower lung.  Significant microbiology data: None  Procedures:  None  Consults:  Psychiatry  Subjective:   Lying comfortably in bed-asking about drug rehab options.  No chest pain/shortness of breath.  Awaiting placement at  this point.  Objective: Vitals: Blood pressure (!) 119/90, pulse 78, temperature 97.8 F (36.6 C), temperature source Oral, resp. rate 18, height 5\' 9"  (1.753 m), weight 86.2 kg, SpO2 95 %.   Exam: Gen Exam:Alert awake-not in any distress HEENT:atraumatic, normocephalic Chest: B/L clear to auscultation anteriorly CVS:S1S2 regular Abdomen:soft non tender, non distended Extremities:no edema right AKA, left big toe amputation Neurology: Non focal Skin: no rash    Assessment/Plan: Pulmonary embolism with right lung infarct:  Pleuritic pain has resolved-on Eliquis Etiology likely due to noncompliance Indefinite anticoagulation-given prior episode of VTE  Chronic HFpEF/HFrEF:  Euvolemic-continue Lasix/Coreg/lisinopril. Outpatient cardiology follow-up-if he is compliant with medication-could be switched to Entresto   Cough:  Suspect from GERD/lung infarct Resolved  Suicidal ideation: Appreciate psych input - now stable, cleared by psych.  HTN  Stable-continue amlodipine/Coreg/lisinopril  History of bipolar disorder Continue Abilify.   History of CVA with right hemiparesis/facial droop On anticoagulation  Peripheral neuropathy: Continue Neurontin and as needed oxycodone.   DM-2 (A1c 6.0 on 9/17):  CBG stable with SSI.     Recent Labs    09/09/22 1647 09/09/22 2041 09/10/22 0729  GLUCAP 89 137* 116*     History of right AKA-left great toe amputation  COPD Not in exacerbation-continue Brovana/Yupelri/budesonide- will convert to inhaler regimen when closer to discharge.  Polysubstance abuse-cocaine Counseled  Homelessness:  Very vague about his situation-claims he sleeps wherever he can-eats whatever he can get-claims he is on a wheelchair (right AKA/right hemiparesis).   Social worker following.  BMI: Estimated body mass index is 28.06 kg/m as calculated from the following:   Height as of this encounter: 5\' 9"  (1.753 m).   Weight as of this encounter:  86.2 kg.   Code status:   Code Status: Full  Code   DVT Prophylaxis: apixaban (ELIQUIS) tablet 5 mg    Family Communication: None at bedside  Disposition Plan: Status is: Inpatient Medically stable await safe disposition, TOC working on it.  Diet: Diet Order             Diet regular Room service appropriate? Yes; Fluid consistency: Thin  Diet effective now                    MEDICATIONS: Scheduled Meds:  amLODipine  10 mg Oral Daily   apixaban  5 mg Oral BID   arformoterol  15 mcg Nebulization BID   ARIPiprazole  10 mg Oral Daily   budesonide (PULMICORT) nebulizer solution  0.25 mg Nebulization BID   carvedilol  3.125 mg Oral BID WC   escitalopram  5 mg Oral Daily   folic acid  1 mg Oral Daily   furosemide  40 mg Oral BID   gabapentin  400 mg Oral TID   insulin aspart  0-9 Units Subcutaneous TID WC   lisinopril  10 mg Oral Daily   revefenacin  175 mcg Nebulization Daily   thiamine  100 mg Oral Daily   Continuous Infusions: PRN Meds:.hydrALAZINE, mouth rinse, oxyCODONE   I have personally reviewed following labs and imaging studies  LABORATORY DATA:  Recent Labs  Lab 09/09/22 0002  WBC 5.9  HGB 12.5*  HCT 38.9*  PLT 321  MCV 90.3  MCH 29.0  MCHC 32.1  RDW 17.5*     Recent Labs  Lab 09/09/22 0640  NA 135  K 3.8  CL 103  CO2 24  GLUCOSE 87  BUN 15  CREATININE 0.82  CALCIUM 9.2     RADIOLOGY STUDIES/RESULTS: No results found.   LOS: 24 days   Signature  Oren Binet M.D on 09/10/2022 at 10:15 AM   -  To page go to [email or web address]

## 2022-09-11 LAB — GLUCOSE, CAPILLARY
Glucose-Capillary: 108 mg/dL — ABNORMAL HIGH (ref 70–99)
Glucose-Capillary: 87 mg/dL (ref 70–99)
Glucose-Capillary: 126 mg/dL — ABNORMAL HIGH (ref 70–99)
Glucose-Capillary: 94 mg/dL (ref 70–99)

## 2022-09-11 NOTE — TOC Progression Note (Addendum)
Transition of Care [facility]) - Initial/Assessment Note    Patient Details  Name: [name] MRN: [id number] Date of Birth: [birth year]  Transition of Care [facility]) CM/SW Contact:    Milinda Antis, [place] Phone Number: 09/11/2022, 11:13 AM  Clinical Narrative:                 CSW met with patient at bedside.  The patient states that he would like to go to an inpatient rehab for substance use.   CSW  completed chart review and observed that the patient has already been denied by [facility].  CSW contacted the following SNF that are considering the patient.  [facility]- Patient owes money Emergency planning/management officer- Left VM [facility]- Left VM    Expected Discharge Plan: Skilled Nursing Facility Barriers to Discharge: [place] (PASRR), Insurance Authorization, SNF Pending bed offer, Active Substance Use - Placement, Homeless with medical needs   Patient Goals and CMS Choice Patient states their goals for this hospitalization and ongoing recovery are:: Rehab CMS Medicare.gov Compare Post Acute Care list provided to:: Patient Choice offered to / list presented to : Patient  Expected Discharge Plan and Services Expected Discharge Plan: [place] In-house Referral: Clinical Social Work   Post Acute Care Choice: [place] Living arrangements for the past 2 months: Homeless                                      Prior Living Arrangements/Services Living arrangements for the past 2 months: Homeless Lives with:: Self Patient language and need for interpreter reviewed:: Yes Do you feel safe going back to the place where you live?: Yes      Need for Family Participation in Patient Care: No (Comment) Care giver support system in place?: No (comment) Current home services: DME Administrator, Civil Service) Criminal Activity/Legal Involvement Pertinent to Current Situation/Hospitalization: No - Comment as needed  Activities of Daily Living      Permission  Sought/Granted                  Emotional Assessment Appearance:: Appears stated age     Orientation: : Oriented to Self, Oriented to Place, Oriented to  Time, Oriented to Situation Alcohol / Substance Use: Illicit Drugs Psych Involvement: Yes (comment)  Admission diagnosis:  Pulmonary emboli (HCC) [I26.99] Acute pulmonary embolism, unspecified pulmonary embolism type, unspecified whether acute cor pulmonale present (HCC) [I26.99] Patient Active Problem List   Diagnosis Date Noted   Pulmonary emboli ([place]) 08/17/2022   Venous ulcer ([place]) 07/23/2022   Sinus bradycardia 07/23/2022   Anemia of chronic disease 07/23/2022   S/P AKA (above knee amputation), right ([place]) 07/23/2022   Acute on chronic systolic congestive heart failure ([place]) 07/12/2022   History of DVT (deep vein thrombosis) 07/12/2022   CAP (community acquired pneumonia) 07/12/2022   Chronic venous hypertension (idiopathic) with ulcer and inflammation of bilateral lower extremity ([place])    Sepsis due to cellulitis ([place]) 04/04/2022   Normocytic anemia 04/04/2022   Acute deep vein thrombosis (DVT) of brachial vein ([place]) 03/31/2022   Swelling of lower extremity 03/23/2022   SIRS (systemic inflammatory response syndrome) ([place]) 03/23/2022   Leg swelling 03/23/2022   Acute on chronic systolic heart failure ([place]) 02/26/2022   Seizure ([place]) 02/26/2022   Venous stasis ulcer of right ankle limited to breakdown of skin with varicose veins ([place])    Hypertensive urgency 01/29/2022   Prolonged  QT interval 01/29/2022   Alcohol abuse 01/29/2022   chronic ulcers 01/29/2022   Wound infection 12/04/2021   Open wounds involving multiple regions of lower extremity 12/03/2021   Mixed diabetic hyperlipidemia associated with type 2 diabetes mellitus ([place]) 12/03/2021   Cellulitis of right lower extremity 11/13/2021   Type 2 diabetes mellitus with diabetic polyneuropathy, without long-term current use of insulin ([place]) 11/13/2021    Antisocial personality disorder ([place]) 11/04/2021   Malingering 11/04/2021   Foot pain 04/23/2021   Chronic combined systolic and diastolic congestive heart failure ([place]) 03/02/2021   History of stroke 03/02/2021   Polysubstance abuse ([place]) 03/02/2021   Skin ulcer of right great toe with necrosis of bone ([place])    Encounter for assessment of healthcare decision-making capacity    Pain, dental    Dystonia    Dysarthria    Shock liver 16/05/3709   Acute metabolic encephalopathy 62/69/4854   Coronary artery disease involving native coronary artery of native heart without angina pectoris 01/04/2021   Essential hypertension 01/04/2021   Bipolar I disorder ([place]) 01/04/2021   Cocaine abuse ([place]) 01/04/2021   Homelessness 01/04/2021   HLD (hyperlipidemia) 01/04/2021   Osteomyelitis of great toe (HCC)    Ulcer of great toe, left, with necrosis of bone (HCC)    Ulcer of great toe, right, with necrosis of muscle (HCC)    Acute osteomyelitis of toe, left (HCC)    Acute on chronic combined systolic and diastolic CHF (congestive heart failure) ([place]) 12/20/2020   Respiratory failure ([place])    Ischemic stroke ([place]) 12/17/2020   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   Diabetic foot ulcer ([place]) 12/08/2020   CHF (congestive heart failure) ([place]) 11/14/2020   CHF exacerbation ([place]) 11/13/2020   Hypokalemia    Hyperlipidemia    Acute hypoxemic respiratory failure ([place]) 10/26/2020   COPD with acute exacerbation ([place]) 10/14/2020   Acute on chronic combined systolic and diastolic CHF (congestive heart failure) ([place]) 10/02/2020   Heart failure with reduced ejection fraction and diastolic dysfunction ([place]) 09/07/2020   Near syncope 07/21/2020   Acid reflux 06/03/2020   Cannabis abuse 03/19/2020   Acute bronchitis due to Rhinovirus 09/08/2019   Schizoaffective disorder ([place]) 06/24/2019   SOB (shortness of breath)    QT prolongation 05/25/2019   Cocaine abuse ([place])    Elevated troponin    Homeless     Suicidal ideation    Bilateral lower extremity pain 04/05/2019   Housing situation unstable 03/28/2019   Bilateral lower extremity edema    Acute exacerbation of CHF (congestive heart failure) ([place]) 03/04/2019   Alcohol withdrawal syndrome, with delirium ([place]) 05/03/2018   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   Mild intermittent asthma without complication 62/70/3500   Asthma 12/26/2016   Tobacco use disorder 12/26/2016   Schizophrenia ([place]) 12/26/2016   Severe episode of recurrent major depressive disorder, with psychotic features ([place]) 10/31/2016   Cocaine abuse with cocaine-induced mood disorder ([place]) 09/06/2016   Alcohol use disorder, mild, abuse 07/31/2016   Substance induced mood disorder ([place]) 07/30/2016   Schizoaffective disorder, bipolar type ([place]) 05/31/2016   Cocaine use disorder, severe, dependence ([place]) 05/31/2016   Major depressive disorder, recurrent episode ([place]) 05/30/2016   Suicidal behavior with attempted self-injury ([place]) 05/08/2016   Acetaminophen overdose 05/06/2016   PCP:  Marliss Coots, NP Pharmacy:   [place], [place] [address] [place] [address] Phone: [phone] Fax: [phone]  [facility] -  [place] [address] Phone: [phone] Fax: [phone]     Social Determinants of Health (SDOH) Interventions    Readmission Risk Interventions    08/18/2022    9:00 AM 07/18/2022    4:11 PM 04/08/2022    1:43 PM  Readmission Risk Prevention Plan  Transportation Screening Complete Complete Complete  Medication Review (RN Care Manager) Complete Complete Complete  PCP or Specialist appointment within 3-5 days of discharge Complete Complete Complete  HRI or Home Care Consult Complete Complete Not Complete  HRI or Home Care Consult Pt Refusal Comments   Pt is homeless  SW Recovery Care/Counseling Consult  Complete Complete Complete  Palliative Care Screening Not Applicable  Not [place] Not Applicable Complete Not Applicable

## 2022-09-11 NOTE — Progress Notes (Signed)
PROGRESS NOTE        PATIENT DETAILS Name: [name] Age: 56 y.o. Sex: male Date of Birth: 12-04-1965 Admit Date: 08/16/2022 Admitting Physician Lurline Del, MD AVW:[id number], Chales Abrahams, NP  Brief Summary:   Patient is a 56 y.o.  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  He was subsequently started on anticoagulation and admitted to the hospitalist service.  Further hospital course was complicated by patient having suicidal ideation-psychiatry was consulted-bedside sitter was placed-with recommendations to pursue inpatient psychiatric admission.  Later patient was cleared by psychiatry, no indication for inpatient psych admission, social worker assisting with SNF placement.  Significant events: 5/4-5/12>> hospitalization for sepsis from cellulitis from chronic lower extremity wound. 8/12-8/25>> hospitalization for HFrEF exacerbation/hypoxia 9/16>> admit to [facility]-pleuritic chest pain/shortness of breath-CTA with bilateral PE and pulmonary infarct.  Significant studies: 9/16>> CTA chest: Bilateral PE-right lung infarct. 9/16>> bilateral lower extremity angiogram: No major stenosis. 9/17>> x-ray left ankle: No fracture/dislocation. 9/17>> UDS: Positive for opiates/cocaine 9/18>> lower extremity Doppler: No DVT 9/18>> Limited echo: EF<20%, RV systolic function moderately reduced. 9/20>> CXR: Slight worsening of peripheral density in the right mid/lower lung.  Significant microbiology data: None  Procedures:  None  Consults:  Psychiatry  Subjective:   Sitting in wheelchair-no SOB/Chest pain  Objective: Vitals: Blood pressure (!) 133/95, pulse 92, temperature 97.9  F (36.6 C), temperature source Oral, resp. rate 16, height 5\' 9"  (1.753 m), weight 75.4 kg, SpO2 98 %.   Exam: Gen Exam:Alert awake-not in any distress HEENT:atraumatic, normocephalic Chest: B/L clear to auscultation anteriorly CVS:S1S2 regular Abdomen:soft non tender, non distended Extremities:no edema right AKA, left big toe amputation Neurology: Non focal Skin: no rash    Assessment/Plan: Pulmonary embolism with right lung infarct:  Pleuritic pain has resolved-on Eliquis Etiology likely due to noncompliance Indefinite anticoagulation-given prior episode of VTE  Chronic HFpEF/HFrEF:  Euvolemic-continue Lasix/Coreg/lisinopril. Outpatient cardiology follow-up-if he is compliant with medication-could be switched to Entresto   Cough:  Suspect from GERD/lung infarct Resolved  Suicidal ideation: Appreciate psych input - now stable, cleared by psych.  HTN  Stable-continue amlodipine/Coreg/lisinopril  History of bipolar disorder Continue Abilify.   History of CVA with right hemiparesis/facial droop On anticoagulation  Peripheral neuropathy: Continue Neurontin and as needed oxycodone.   DM-2 (A1c 6.0 on 9/17):  CBG stable with SSI.     Recent Labs    09/10/22 2126 09/11/22 0729 09/11/22 1109  GLUCAP 114* 108* 87     History of right AKA-left great toe amputation  COPD Not in exacerbation-continue Brovana/Yupelri/budesonide- will convert to inhaler regimen when closer to discharge.  Polysubstance abuse-cocaine Counseled  Homelessness:  Very vague about his situation-claims he sleeps wherever he can-eats whatever he can get-claims he is on a wheelchair (right AKA/right hemiparesis).   Social worker following.  BMI: Estimated body mass index is 24.55 kg/m as calculated from the following:   Height as of this encounter: 5\' 9"  (1.753 m).   Weight as of this encounter: 75.4 kg.   Code status:   Code Status: Full Code   DVT Prophylaxis: apixaban  (ELIQUIS) tablet 5 mg    Family Communication:  None at bedside  Disposition Plan: Status is: Inpatient Medically stable await safe disposition, TOC working on it.  Diet: Diet Order             Diet regular Room service appropriate? Yes; Fluid consistency: Thin  Diet effective now                    MEDICATIONS: Scheduled Meds:  amLODipine  10 mg Oral Daily   apixaban  5 mg Oral BID   arformoterol  15 mcg Nebulization BID   ARIPiprazole  10 mg Oral Daily   budesonide (PULMICORT) nebulizer solution  0.25 mg Nebulization BID   carvedilol  3.125 mg Oral BID WC   escitalopram  5 mg Oral Daily   folic acid  1 mg Oral Daily   furosemide  40 mg Oral BID   gabapentin  400 mg Oral TID   insulin aspart  0-9 Units Subcutaneous TID WC   lisinopril  10 mg Oral Daily   revefenacin  175 mcg Nebulization Daily   thiamine  100 mg Oral Daily   Continuous Infusions: PRN Meds:.hydrALAZINE, mouth rinse, oxyCODONE   I have personally reviewed following labs and imaging studies  LABORATORY DATA:  Recent Labs  Lab 09/09/22 0002  WBC 5.9  HGB 12.5*  HCT 38.9*  PLT 321  MCV 90.3  MCH 29.0  MCHC 32.1  RDW 17.5*     Recent Labs  Lab 09/09/22 0640  NA 135  K 3.8  CL 103  CO2 24  GLUCOSE 87  BUN 15  CREATININE 0.82  CALCIUM 9.2     RADIOLOGY STUDIES/RESULTS: No results found.   LOS: 25 days   Signature  Oren Binet M.D on 09/11/2022 at 1:53 PM   -  To page go to [email or web address]

## 2022-09-11 NOTE — Progress Notes (Signed)
Mobility Specialist Progress Note:   09/11/22 1400  Mobility  Activity Ambulated with assistance in hallway  Level of Assistance Contact guard assist, steadying assist  [place] wheel walker;Wheelchair  Distance Ambulated (ft) 70 ft  Activity Response Tolerated well  Mobility Referral Yes  $Mobility charge 1 Mobility   Pt eager for mobility session. Ambulated 80f with RW with w/c follow. Required x3 seated rest breaks throughout d/t fatigue. Pt back in room with all needs met.   [facility] Secure Chat or Office Phone: [phone]

## 2022-09-12 LAB — GLUCOSE, CAPILLARY
Glucose-Capillary: 119 mg/dL — ABNORMAL HIGH (ref 70–99)
Glucose-Capillary: 96 mg/dL (ref 70–99)
Glucose-Capillary: 108 mg/dL — ABNORMAL HIGH (ref 70–99)

## 2022-09-12 NOTE — Progress Notes (Signed)
PROGRESS NOTE        PATIENT DETAILS Name: [name] Age: 56 y.o. Sex: male Date of Birth: 1966-03-31 Admit Date: 08/16/2022 Admitting Physician Clance Boll, MD YHC:[id number], [name], NP  Brief Summary:   Patient is a 56 y.o.  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  He was subsequently started on anticoagulation and admitted to the hospitalist service.  Further hospital course was complicated by patient having suicidal ideation-psychiatry was consulted-bedside sitter was placed-with recommendations to pursue inpatient psychiatric admission.  Later patient was cleared by psychiatry, no indication for inpatient psych admission, social worker assisting with SNF placement.  Significant events: 5/4-5/12>> hospitalization for sepsis from cellulitis from chronic lower extremity wound. 8/12-8/25>> hospitalization for HFrEF exacerbation/hypoxia 9/16>> admit to TRH-pleuritic chest pain/shortness of breath-CTA with bilateral PE and pulmonary infarct.  Significant studies: 9/16>> CTA chest: Bilateral PE-right lung infarct. 9/16>> bilateral lower extremity angiogram: No major stenosis. 9/17>> x-ray left ankle: No fracture/dislocation. 9/17>> UDS: Positive for opiates/cocaine 9/18>> lower extremity Doppler: No DVT 9/18>> Limited echo: GB<15%, RV systolic function moderately reduced. 9/20>> CXR: Slight worsening of peripheral density in the right mid/lower lung.  Significant microbiology data: None  Procedures:  None  Consults:  Psychiatry  Subjective:   No major issues overnight.  Objective: Vitals: Blood pressure 121/86, pulse 95, temperature 97.9 F (36.6 C),  temperature source Oral, resp. rate 18, height 5\' 9"  (1.753 m), weight 75.4 kg, SpO2 96 %.   Exam: Gen Exam:Alert awake-not in any distress HEENT:atraumatic, normocephalic Chest: B/L clear to auscultation anteriorly CVS:S1S2 regular Abdomen:soft non tender, non distended Extremities:no edema right AKA, left big toe amputation Neurology: Non focal Skin: no rash    Assessment/Plan: Pulmonary embolism with right lung infarct:  Pleuritic pain has resolved-on Eliquis Etiology likely due to noncompliance Indefinite anticoagulation-given prior episode of VTE  Chronic HFpEF/HFrEF:  Euvolemic-continue Lasix/Coreg/lisinopril. Outpatient cardiology follow-up-if he is compliant with medication-could be switched to Entresto   Cough:  Suspect from GERD/lung infarct Resolved  Suicidal ideation: Appreciate psych input - now stable, cleared by psych.  HTN  Stable-continue amlodipine/Coreg/lisinopril  History of bipolar disorder Continue Abilify.   History of CVA with right hemiparesis/facial droop On anticoagulation  Peripheral neuropathy: Continue Neurontin and as needed oxycodone.   DM-2 (A1c 6.0 on 9/17):  CBG stable with SSI.     Recent Labs    09/11/22 1706 09/11/22 2152 09/12/22 0730  GLUCAP 126* 94 119*     History of right AKA-left great toe amputation  Left medial malleoli ulcer Wound care evaluation  COPD Not in exacerbation-continue Brovana/Yupelri/budesonide- will convert to inhaler regimen when closer to discharge.  Polysubstance abuse-cocaine Counseled  Homelessness:  Very vague about his situation-claims he sleeps wherever he can-eats whatever he can get-claims he is on a wheelchair (right AKA/right hemiparesis).   Social worker following.  BMI: Estimated body mass index is 24.55 kg/m as calculated from the following:   Height as of this encounter: 5\' 9"  (1.753 m).   Weight as of this encounter: 75.4 kg.   Code status:   Code Status: Full Code    DVT Prophylaxis: apixaban (ELIQUIS) tablet 5  mg    Family Communication: None at bedside  Disposition Plan: Status is: Inpatient Medically stable await safe disposition, TOC working on it.  Diet: Diet Order             Diet regular Room service appropriate? Yes; Fluid consistency: Thin  Diet effective now                    MEDICATIONS: Scheduled Meds:  amLODipine  10 mg Oral Daily   apixaban  5 mg Oral BID   arformoterol  15 mcg Nebulization BID   ARIPiprazole  10 mg Oral Daily   budesonide (PULMICORT) nebulizer solution  0.25 mg Nebulization BID   carvedilol  3.125 mg Oral BID WC   escitalopram  5 mg Oral Daily   folic acid  1 mg Oral Daily   furosemide  40 mg Oral BID   gabapentin  400 mg Oral TID   insulin aspart  0-9 Units Subcutaneous TID WC   lisinopril  10 mg Oral Daily   revefenacin  175 mcg Nebulization Daily   thiamine  100 mg Oral Daily   Continuous Infusions: PRN Meds:.hydrALAZINE, mouth rinse, oxyCODONE   I have personally reviewed following labs and imaging studies  LABORATORY DATA:  Recent Labs  Lab 09/09/22 0002  WBC 5.9  HGB 12.5*  HCT 38.9*  PLT 321  MCV 90.3  MCH 29.0  MCHC 32.1  RDW 17.5*     Recent Labs  Lab 09/09/22 0640  NA 135  K 3.8  CL 103  CO2 24  GLUCOSE 87  BUN 15  CREATININE 0.82  CALCIUM 9.2     RADIOLOGY STUDIES/RESULTS: No results found.   LOS: 26 days   Signature  Jeoffrey Massed M.D on 09/12/2022 at 11:18 AM   -  To page go to [email or web address]

## 2022-09-12 NOTE — Progress Notes (Signed)
Physical Therapy Treatment Patient Details Name: [name] MRN: [id number] DOB: 02/17/1966 Today's Date: 09/12/2022   History of Present Illness Pt is a 56yo M who presented 9/16 with c/o chest pain, SOB, and leg swelling. Found to have B PE. PMH includes asthma, bioplar 1, depression, schizoaffective disorder, CVA, chronic CHF, homelessness, HTN, DM2, cocaine and cannabis use and R AKA.    PT Comments    Pt received up in [facility] on arrival and agreeable to session. Pt demonstrating independent WC use, however noted pt WC without arm/foot rest and with faulty L front wheel, pt would benefit from new WC at discharge for safety. Pt able to demonstrate transfers WC<>bed at supervision level with good awareness for safety, locking brakes before transfer. Pt able to come to standing with RW and min assist for cues for hand placement x5, however pt declining gait training this date secondary to L foot pain. Pt continues to benefit from skilled PT services to progress toward functional mobility goals.    Recommendations for follow up therapy are one component of a multi-disciplinary discharge planning process, led by the attending physician.  Recommendations may be updated based on patient status, additional functional criteria and insurance authorization.  Follow Up Recommendations  Skilled nursing-short term rehab (<3 hours/day) Can patient physically be transported by private vehicle: Yes   Assistance Recommended at Discharge Set up Supervision/Assistance  Patient can return home with the following A little help with walking and/or transfers;A little help with bathing/dressing/bathroom   Equipment Recommendations  Wheelchair (measurements PT);Wheelchair cushion (measurements PT)    Recommendations for Other Services       Precautions / Restrictions Precautions Precautions: Fall;Other (comment) Precaution Comments: hx of R AKA w/o prosthetic Restrictions Weight Bearing Restrictions: No      Mobility  Bed Mobility               General bed mobility comments: up in w/c on entry    Transfers Overall transfer level: Needs assistance Equipment used: Rolling walker (2 wheels) Transfers: Bed to chair/wheelchair/BSC, Sit to/from Stand Sit to Stand: Min guard     Squat pivot transfers: Supervision     General transfer comment: supervision to trasnfer WC<>bed, min guard for 5xSTS to RW at end of session    Ambulation/Gait               General Gait Details: pt declining secondary to L heel pain   Psychologist, counselling mobility: Yes Wheelchair propulsion: Both upper extremities Wheelchair parts: Independent Distance: 1000  Modified Rankin (Stroke Patients Only)       Balance Overall balance assessment: Needs assistance Sitting-balance support: No upper extremity supported Sitting balance-Leahy Scale: Good     Standing balance support: During functional activity, Bilateral upper extremity supported Standing balance-Leahy Scale: Poor Standing balance comment: requires UE support in standing due to R AKA and has not received prosthetic yet                            Cognition Arousal/Alertness: Awake/alert Behavior During Therapy: WFL for tasks assessed/performed Overall Cognitive Status: Within Functional Limits for tasks assessed                                 General Comments: appears WFL, able to demo safe w/c brake  mgmt        Exercises Other Exercises Other Exercises: w/c pushups x5 Other Exercises: STS x5 with RW    General Comments General comments (skin integrity, edema, etc.): VSS on RA      Pertinent Vitals/Pain Pain Assessment Pain Assessment: Faces Pain Location: L heel Pain Descriptors / Indicators: Discomfort Pain Intervention(s): Monitored during session, Limited activity within patient's tolerance    Home Living                           Prior Function            PT Goals (current goals can now be found in the care plan section) Acute Rehab PT Goals PT Goal Formulation: With patient Time For Goal Achievement: 09/23/22    Frequency    Min 2X/week      PT Plan Current plan remains appropriate    Co-evaluation              AM-PAC PT "6 Clicks" Mobility   Outcome Measure  Help needed turning from your back to your side while in a flat bed without using bedrails?: None Help needed moving from lying on your back to sitting on the side of a flat bed without using bedrails?: None Help needed moving to and from a bed to a chair (including a wheelchair)?: A Little Help needed standing up from a chair using your arms (e.g., wheelchair or bedside chair)?: A Little Help needed to walk in hospital room?: A Lot Help needed climbing 3-5 steps with a railing? : Total 6 Click Score: 17    End of Session   Activity Tolerance: Patient tolerated treatment well Patient left: in chair;with family/visitor present (up in [facility]) Nurse Communication: Mobility status PT Visit Diagnosis: Other abnormalities of gait and mobility (R26.89)     Time: 1410-1435 PT Time Calculation (min) (ACUTE ONLY): 25 min  Charges:  $Therapeutic Activity: 23-37 mins                     Hilja Kintzel R. PTA Acute Rehabilitation Services Office: [place] 09/12/2022, 2:49 PM

## 2022-09-12 NOTE — Consult Note (Signed)
WOC consulted for ankle ulcer during this admission, see note 09/05/22, orders are in the computer for care for the wound.    Re consult if needed, will not follow at this time. Thanks  Juliette Standre [employer], RN,CWOCN, CNS, [place] [phone])

## 2022-09-12 NOTE — Progress Notes (Signed)
Mobility Specialist Progress Note:   09/12/22 1230  Mobility  Activity Transferred from bed to chair (self-propel w/c)  Level of Assistance Standby assist, set-up cues, supervision of patient - no hands on  Assistive Device Wheelchair  Distance Ambulated (ft) 500 ft (self-propelled w/c)  Activity Response Tolerated well  Mobility Referral Yes  $Mobility charge 1 Mobility   Pt eager for mobility session. Declining use of RW for gait training. Pt self-propelled w/c 540f+ in hallway. Back in room with all needs met.   ANelta NumbersAcute Rehab Secure Chat or Office Phone: [phone]

## 2022-09-12 NOTE — Progress Notes (Signed)
Occupational Therapy Treatment Patient Details Name: [name] MRN: [id number] DOB: 07-Jul-1966 Today's Date: 09/12/2022   History of present illness Pt is a 56yo M who presented 9/16 with c/o chest pain, SOB, and leg swelling. Found to have B PE. PMH includes asthma, bioplar 1, depression, schizoaffective disorder, CVA, chronic CHF, homelessness, HTN, DM2, cocaine and cannabis use and R AKA.   OT comments  Pt making excellent progress towards OT goals, meeting 5/6 goals which were updated accordingly. Pt able to demonstrate ability to perform LB dressing, toilet transfers (including adequate clothing mgmt), and wheelchair mobility in small spaces with Modified Independence. Pt with baseline R UE ataxia and tremors from prior CVA with good awareness of compensatory strategies. Noted pt with housing insecurity though based on ability to perform ADLs from a wheelchair level w/o assistance, updating DC recs to no OT follow up needed. Will continue to follow acutely to assess bathing, higher level tasks from w/c level and provide HEP for RUE. Pt requesting new wheelchair as current wheelchair worn and pt concerned about ability to maneuver with it on uneven terrain.   Recommendations for follow up therapy are one component of a multi-disciplinary discharge planning process, led by the attending physician.  Recommendations may be updated based on patient status, additional functional criteria and insurance authorization.    Follow Up Recommendations  No OT follow up    Assistance Recommended at Discharge PRN  Patient can return home with the following  Assist for transportation   Equipment Recommendations  Wheelchair (measurements OT);Wheelchair cushion (measurements OT)    Recommendations for Other Services      Precautions / Restrictions Precautions Precautions: Fall;Other (comment) Precaution Comments: hx of R AKA w/o prosthetic Restrictions Weight Bearing Restrictions: No        Mobility Bed Mobility               General bed mobility comments: up in w/c on entry    Transfers Overall transfer level: Modified independent Equipment used: None Transfers: Bed to chair/wheelchair/BSC             General transfer comment: see toilet transfer in ADL section     Balance Overall balance assessment: Needs assistance Sitting-balance support: No upper extremity supported Sitting balance-Leahy Scale: Good     Standing balance support: During functional activity, Bilateral upper extremity supported Standing balance-Leahy Scale: Poor Standing balance comment: requires UE support in standing due to R AKA and has not received prosthetic yet                           ADL either performed or assessed with clinical judgement   ADL Overall ADL's : Needs assistance/impaired Eating/Feeding: Modified independent                   Lower Body Dressing: Modified independent;Sitting/lateral leans Lower Body Dressing Details (indicate cue type and reason): able to doff/don L sock bending forward from w/c x 2 (removed again to show MD pt foot) Toilet Transfer: Modified Independent;Regular Toilet;Squat-pivot Toilet Transfer Details (indicate cue type and reason): able to navigate in/out of bathroom using wheelchair and demo good locking of w/c brakes. pt holding gown up out of the way with his mouth. educated on trial of folding gown up into front pocket or through neck of gown to keep mouth free Toileting- Clothing Manipulation and Hygiene: Modified independent;Sitting/lateral lean Toileting - Clothing Manipulation Details (indicate cue type and reason): able to manage  clothing easily       General ADL Comments: Pt appears at most recent baseline since CVA and R AKA, able to perform ADLs from w/c level w/o assistance. Pt also able take breakfast tray to warm up using wheelchair with MOD I    Extremity/Trunk Assessment Upper Extremity  Assessment Upper Extremity Assessment: RUE deficits/detail RUE Deficits / Details: Ataxic RUE from prior CVA. Ataxic during all intentional reaching and AROM at the shoulder limited to 90 degrees. MMT 3+/5 overall distally except hand with Negative composite grip. With WB and heavy lifting, R UE tremors cease but present when attempting to reach/open small containers, etc RUE Sensation: decreased light touch RUE Coordination: decreased gross motor;decreased fine motor   Lower Extremity Assessment Lower Extremity Assessment: Defer to PT evaluation        Vision   Vision Assessment?: No apparent visual deficits   Perception     Praxis      Cognition Arousal/Alertness: Awake/alert Behavior During Therapy: WFL for tasks assessed/performed Overall Cognitive Status: Within Functional Limits for tasks assessed                                 General Comments: appears WFL, able to demo safe w/c brake mgmt, demo anticipatory awareness for fall prevention, safe ADL completion, etc        Exercises Exercises: Other exercises Other Exercises Other Exercises: w/c pushups    Shoulder Instructions       General Comments      Pertinent Vitals/ Pain       Pain Assessment Pain Assessment: No/denies pain  Home Living                                          Prior Functioning/Environment              Frequency  Min 2X/week        Progress Toward Goals  OT Goals(current goals can now be found in the care plan section)  Progress towards OT goals: Progressing toward goals  Acute Rehab OT Goals Patient Stated Goal: get new wheelchair, find a place to live OT Goal Formulation: With patient Time For Goal Achievement: 09/18/22 Potential to Achieve Goals: Good ADL Goals Pt Will Perform Upper Body Bathing: Independently;sitting Pt Will Perform Lower Body Bathing: Independently;sit to/from stand Pt Will Perform Upper Body Dressing:  Independently;sitting Pt Will Perform Lower Body Dressing: Independently;sitting/lateral leans Pt Will Transfer to Toilet: Independently;squat pivot transfer;regular height toilet Pt Will Perform Toileting - Clothing Manipulation and hygiene: Independently  Plan Discharge plan needs to be updated;Frequency needs to be updated    Co-evaluation                 AM-PAC OT "6 Clicks" Daily Activity     Outcome Measure   Help from another person eating meals?: None Help from another person taking care of personal grooming?: None Help from another person toileting, which includes using toliet, bedpan, or urinal?: None Help from another person bathing (including washing, rinsing, drying)?: A Little Help from another person to put on and taking off regular upper body clothing?: None Help from another person to put on and taking off regular lower body clothing?: None 6 Click Score: 23    End of Session Equipment Utilized During Treatment: Other (comment) (wc)  OT  Visit Diagnosis: Unsteadiness on feet (R26.81);Muscle weakness (generalized) (M62.81);Other abnormalities of gait and mobility (R26.89)   Activity Tolerance Patient tolerated treatment well   Patient Left Other (comment) (in wheelchair)   Nurse Communication Mobility status        Time: [phone] OT Time Calculation (min): 28 min  Charges: OT General Charges $OT Visit: 1 Visit OT Treatments $Self Care/Home Management : 23-37 mins  Malachy Chamber, OTR/L Acute Rehab Services Office: [phone]   Layla Maw 09/12/2022, 8:18 AM

## 2022-09-12 NOTE — TOC Progression Note (Addendum)
Transition of Care [facility]) - Initial/Assessment Note    Patient Details  Name: [name] MRN: [id number] Date of Birth: [birth year]  [facility] of [facility]) CM/SW Contact:    Milinda Antis, [place] Phone Number: 09/12/2022, 10:40 AM  Clinical Narrative:                 CSW contacted Gayla Medicus with [facility] to inquire about bed availability and is awaiting a returned call.   12:49-  CSW met with the patient at bedside and provided updates.  Expected Discharge Plan: Skilled Nursing Facility Barriers to Discharge: [place] (PASRR), Insurance Authorization, SNF Pending bed offer, Active Substance Use - Placement, Homeless with medical needs   Patient Goals and CMS Choice Patient states their goals for this hospitalization and ongoing recovery are:: Rehab CMS Medicare.gov Compare Post Acute Care list provided to:: Patient Choice offered to / list presented to : Patient  Expected Discharge Plan and Services Expected Discharge Plan: [place] In-house Referral: Clinical Social Work   Post Acute Care Choice: [place] Living arrangements for the past 2 months: Homeless                                      Prior Living Arrangements/Services Living arrangements for the past 2 months: Homeless Lives with:: Self Patient language and need for interpreter reviewed:: Yes Do you feel safe going back to the place where you live?: Yes      Need for Family Participation in Patient Care: No (Comment) Care giver support system in place?: No (comment) Current home services: DME Administrator, Civil Service) Criminal Activity/Legal Involvement Pertinent to Current Situation/Hospitalization: No - Comment as needed  Activities of Daily Living      Permission Sought/Granted                  Emotional Assessment Appearance:: Appears stated age     Orientation: : Oriented to Self, Oriented to Place, Oriented to  Time, Oriented to  Situation Alcohol / Substance Use: Illicit Drugs Psych Involvement: Yes (comment)  Admission diagnosis:  Pulmonary emboli (HCC) [I26.99] Acute pulmonary embolism, unspecified pulmonary embolism type, unspecified whether acute cor pulmonale present (HCC) [I26.99] Patient Active Problem List   Diagnosis Date Noted   Pulmonary emboli ([name]) 08/17/2022   Venous ulcer ([place]) 07/23/2022   Sinus bradycardia 07/23/2022   Anemia of chronic disease 07/23/2022   S/P AKA (above knee amputation), right ([place]) 07/23/2022   Acute on chronic systolic congestive heart failure ([place]) 07/12/2022   History of DVT (deep vein thrombosis) 07/12/2022   CAP (community acquired pneumonia) 07/12/2022   Chronic venous hypertension (idiopathic) with ulcer and inflammation of bilateral lower extremity ([place])    Sepsis due to cellulitis ([place]) 04/04/2022   Normocytic anemia 04/04/2022   Acute deep vein thrombosis (DVT) of brachial vein ([place]) 03/31/2022   Swelling of lower extremity 03/23/2022   SIRS (systemic inflammatory response syndrome) ([place]) 03/23/2022   Leg swelling 03/23/2022   Acute on chronic systolic heart failure ([place]) 02/26/2022   Seizure ([place]) 02/26/2022   Venous stasis ulcer of right ankle limited to breakdown of skin with varicose veins ([place])    Hypertensive urgency 01/29/2022   Prolonged QT interval 01/29/2022   Alcohol abuse 01/29/2022   chronic ulcers 01/29/2022   Wound infection 12/04/2021   Open wounds involving multiple regions of lower extremity 12/03/2021   Mixed diabetic hyperlipidemia associated with  type 2 diabetes mellitus ([place]) 12/03/2021   Cellulitis of right lower extremity 11/13/2021   Type 2 diabetes mellitus with diabetic polyneuropathy, without long-term current use of insulin ([place]) 11/13/2021   Antisocial personality disorder ([place]) 11/04/2021   Malingering 11/04/2021   Foot pain 04/23/2021   Chronic combined systolic and diastolic congestive heart failure ([place]) 03/02/2021    History of stroke 03/02/2021   Polysubstance abuse ([place]) 03/02/2021   Skin ulcer of right great toe with necrosis of bone ([place])    Encounter for assessment of healthcare decision-making capacity    Pain, dental    Dystonia    Dysarthria    Shock liver 00/92/3300   Acute metabolic encephalopathy 76/22/6333   Coronary artery disease involving native coronary artery of native heart without angina pectoris 01/04/2021   Essential hypertension 01/04/2021   Bipolar I disorder ([place]) 01/04/2021   Cocaine abuse ([place]) 01/04/2021   Homelessness 01/04/2021   HLD (hyperlipidemia) 01/04/2021   Osteomyelitis of great toe (HCC)    Ulcer of great toe, left, with necrosis of bone (HCC)    Ulcer of great toe, right, with necrosis of muscle (HCC)    Acute osteomyelitis of toe, left (HCC)    Acute on chronic combined systolic and diastolic CHF (congestive heart failure) ([place]) 12/20/2020   Respiratory failure ([place])    Ischemic stroke ([place]) 12/17/2020   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   Diabetic foot ulcer ([place]) 12/08/2020   CHF (congestive heart failure) ([place]) 11/14/2020   CHF exacerbation ([place]) 11/13/2020   Hypokalemia    Hyperlipidemia    Acute hypoxemic respiratory failure ([place]) 10/26/2020   COPD with acute exacerbation ([place]) 10/14/2020   Acute on chronic combined systolic and diastolic CHF (congestive heart failure) ([place]) 10/02/2020   Heart failure with reduced ejection fraction and diastolic dysfunction ([place]) 09/07/2020   Near syncope 07/21/2020   Acid reflux 06/03/2020   Cannabis abuse 03/19/2020   Acute bronchitis due to Rhinovirus 09/08/2019   Schizoaffective disorder ([place]) 06/24/2019   SOB (shortness of breath)    QT prolongation 05/25/2019   Cocaine abuse (HCC)    Elevated troponin    Homeless    Suicidal ideation    Bilateral lower extremity pain 04/05/2019   Housing situation unstable 03/28/2019   Bilateral lower extremity edema    Acute exacerbation of CHF (congestive heart  failure) ([place]) 03/04/2019   Alcohol withdrawal syndrome, with delirium ([place]) 05/03/2018   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   Mild intermittent asthma without complication 54/56/2563   Asthma 12/26/2016   Tobacco use disorder 12/26/2016   Schizophrenia ([place]) 12/26/2016   Severe episode of recurrent major depressive disorder, with psychotic features ([place]) 10/31/2016   Cocaine abuse with cocaine-induced mood disorder ([place]) 09/06/2016   Alcohol use disorder, mild, abuse 07/31/2016   Substance induced mood disorder ([place]) 07/30/2016   Schizoaffective disorder, bipolar type ([place]) 05/31/2016   Cocaine use disorder, severe, dependence ([place]) 05/31/2016   Major depressive disorder, recurrent episode ([place]) 05/30/2016   Suicidal behavior with attempted self-injury ([place]) 05/08/2016   Acetaminophen overdose 05/06/2016   PCP:  Marliss Coots, NP Pharmacy:   [place], [place] [address] [place] [address] Phone: [phone] Fax: [place] [address] Phone: [phone] Fax: [phone]     Social Determinants of Health (SDOH) Interventions    Readmission Risk Interventions    08/18/2022  9:00 AM 07/18/2022    4:11 PM 04/08/2022    1:43 PM  Readmission Risk Prevention Plan  Transportation Screening Complete Complete Complete  Medication Review Press photographer) Complete Complete Complete  PCP or Specialist appointment within 3-5 days of discharge Complete Complete Complete  HRI or Home Care Consult Complete Complete Not Complete  HRI or Home Care Consult Pt Refusal Comments   Pt is homeless  SW Recovery Care/Counseling Consult Complete Complete Complete  Palliative Care Screening Not Applicable  Not [place] Not Applicable Complete Not Applicable

## 2022-09-13 LAB — GLUCOSE, CAPILLARY
Glucose-Capillary: 97 mg/dL (ref 70–99)
Glucose-Capillary: 125 mg/dL — ABNORMAL HIGH (ref 70–99)
Glucose-Capillary: 159 mg/dL — ABNORMAL HIGH (ref 70–99)
Glucose-Capillary: 119 mg/dL — ABNORMAL HIGH (ref 70–99)

## 2022-09-13 NOTE — Progress Notes (Signed)
PROGRESS NOTE        PATIENT DETAILS Name: [name] Age: 56 y.o. Sex: male Date of Birth: August 21, 1966 Admit Date: 08/16/2022 Admitting Physician Clance Boll, MD NKN:[id number], [name], NP  Brief Summary:   Patient is a 56 y.o.  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  He was subsequently started on anticoagulation and admitted to the hospitalist service.  Further hospital course was complicated by patient having suicidal ideation-psychiatry was consulted-bedside sitter was placed-with recommendations to pursue inpatient psychiatric admission.  Later patient was cleared by psychiatry, no indication for inpatient psych admission, social worker assisting with SNF placement.  Significant events: 5/4-5/12>> hospitalization for sepsis from cellulitis from chronic lower extremity wound. 8/12-8/25>> hospitalization for HFrEF exacerbation/hypoxia 9/16>> admit to [facility]-pleuritic chest pain/shortness of breath-CTA with bilateral PE and pulmonary infarct.  Significant studies: 9/16>> CTA chest: Bilateral PE-right lung infarct. 9/16>> bilateral lower extremity angiogram: No major stenosis. 9/17>> x-ray left ankle: No fracture/dislocation. 9/17>> UDS: Positive for opiates/cocaine 9/18>> lower extremity Doppler: No DVT 9/18>> Limited echo: AL<93%, RV systolic function moderately reduced. 9/20>> CXR: Slight worsening of peripheral density in the right mid/lower lung.  Significant microbiology data: None  Procedures:  None  Consults:  Psychiatry  Subjective:   Lying comfortably in bed-no major issues overnight.  Objective: Vitals: Blood pressure 136/83, pulse 81,  temperature 97.6 F (36.4 C), temperature source Oral, resp. rate 16, height 5\' 9"  (1.753 m), weight 75.4 kg, SpO2 96 %.   Exam: Gen Exam:Alert awake-not in any distress HEENT:atraumatic, normocephalic Chest: B/L clear to auscultation anteriorly CVS:S1S2 regular Abdomen:soft non tender, non distended Extremities:no edema right AKA, left big toe amputation Neurology: Non focal Skin: no rash    Assessment/Plan: Pulmonary embolism with right lung infarct:  Pleuritic pain has resolved-on Eliquis Etiology likely due to noncompliance Indefinite anticoagulation-given prior episode of VTE  Chronic HFpEF/HFrEF:  Euvolemic-continue Lasix/Coreg/lisinopril. Outpatient cardiology follow-up-if he is compliant with medication-could be switched to Entresto   Cough:  Suspect from GERD/lung infarct Resolved  Suicidal ideation: Appreciate psych input - now stable, cleared by psych.  HTN  Stable-continue amlodipine/Coreg/lisinopril  History of bipolar disorder Continue Abilify.   History of CVA with right hemiparesis/facial droop On anticoagulation  Peripheral neuropathy: Continue Neurontin and as needed oxycodone.   DM-2 (A1c 6.0 on 9/17):  CBG stable with SSI.     Recent Labs    09/12/22 1656 09/12/22 2230 09/13/22 0721  GLUCAP 96 108* 97     History of right AKA-left great toe amputation  Left medial malleoli ulcer Wound care evaluation  COPD Not in exacerbation-continue Brovana/Yupelri/budesonide- will convert to inhaler regimen when closer to discharge.  Polysubstance abuse-cocaine Counseled  Homelessness:  Very vague about his situation-claims he sleeps wherever he can-eats whatever he can get-claims he is on a wheelchair (right AKA/right hemiparesis).   Social worker following.  BMI: Estimated body mass index is 24.55 kg/m as calculated from the following:   Height as of this encounter: 5\' 9"  (1.753 m).   Weight as of this encounter: 75.4 kg.   Code  status:   Code Status: Full Code   DVT Prophylaxis: apixaban (  ELIQUIS) tablet 5 mg    Family Communication: None at bedside  Disposition Plan: Status is: Inpatient Medically stable await safe disposition, TOC working on it.  Diet: Diet Order             Diet regular Room service appropriate? Yes; Fluid consistency: Thin  Diet effective now                    MEDICATIONS: Scheduled Meds:  amLODipine  10 mg Oral Daily   apixaban  5 mg Oral BID   arformoterol  15 mcg Nebulization BID   ARIPiprazole  10 mg Oral Daily   budesonide (PULMICORT) nebulizer solution  0.25 mg Nebulization BID   carvedilol  3.125 mg Oral BID WC   escitalopram  5 mg Oral Daily   folic acid  1 mg Oral Daily   furosemide  40 mg Oral BID   gabapentin  400 mg Oral TID   insulin aspart  0-9 Units Subcutaneous TID WC   lisinopril  10 mg Oral Daily   revefenacin  175 mcg Nebulization Daily   thiamine  100 mg Oral Daily   Continuous Infusions: PRN Meds:.hydrALAZINE, mouth rinse, oxyCODONE   I have personally reviewed following labs and imaging studies  LABORATORY DATA:  Recent Labs  Lab 09/09/22 0002  WBC 5.9  HGB 12.5*  HCT 38.9*  PLT 321  MCV 90.3  MCH 29.0  MCHC 32.1  RDW 17.5*     Recent Labs  Lab 09/09/22 0640  NA 135  K 3.8  CL 103  CO2 24  GLUCOSE 87  BUN 15  CREATININE 0.82  CALCIUM 9.2     RADIOLOGY STUDIES/RESULTS: No results found.   LOS: 27 days   Signature  Oren Binet M.D on 09/13/2022 at 9:47 AM   -  To page go to [email or web address]

## 2022-09-14 LAB — GLUCOSE, CAPILLARY
Glucose-Capillary: 112 mg/dL — ABNORMAL HIGH (ref 70–99)
Glucose-Capillary: 73 mg/dL (ref 70–99)
Glucose-Capillary: 98 mg/dL (ref 70–99)
Glucose-Capillary: 117 mg/dL — ABNORMAL HIGH (ref 70–99)

## 2022-09-14 NOTE — Progress Notes (Signed)
PROGRESS NOTE        PATIENT DETAILS Name: [name] Age: 56 y.o. Sex: male Date of Birth: 1966/10/17 Admit Date: 08/16/2022 Admitting Physician Lurline Del, MD PYP:[id number], Chales Abrahams, NP  Brief Summary:   Patient is a 56 y.o.  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  He was subsequently started on anticoagulation and admitted to the hospitalist service.  Further hospital course was complicated by patient having suicidal ideation-psychiatry was consulted-bedside sitter was placed-with recommendations to pursue inpatient psychiatric admission.  Later patient was cleared by psychiatry, no indication for inpatient psych admission, social worker assisting with SNF placement.  Significant events: 5/4-5/12>> hospitalization for sepsis from cellulitis from chronic lower extremity wound. 8/12-8/25>> hospitalization for HFrEF exacerbation/hypoxia 9/16>> admit to [facility]-pleuritic chest pain/shortness of breath-CTA with bilateral PE and pulmonary infarct.  Significant studies: 9/16>> CTA chest: Bilateral PE-right lung infarct. 9/16>> bilateral lower extremity angiogram: No major stenosis. 9/17>> x-ray left ankle: No fracture/dislocation. 9/17>> UDS: Positive for opiates/cocaine 9/18>> lower extremity Doppler: No DVT 9/18>> Limited echo: EF<20%, RV systolic function moderately reduced. 9/20>> CXR: Slight worsening of peripheral density in the right mid/lower lung.  Significant microbiology data: None  Procedures:  None  Consults:  Psychiatry  Subjective:   No chest pain/shortness of breath Inquiring about if he will have a bed at drug rehab.  Objective: Vitals: Blood  pressure (!) 140/82, pulse 83, temperature 97.6 F (36.4 C), temperature source Oral, resp. rate 18, height 5\' 9"  (1.753 m), weight 75.4 kg, SpO2 99 %.   Exam: Gen Exam:Alert awake-not in any distress HEENT:atraumatic, normocephalic Chest: B/L clear to auscultation anteriorly CVS:S1S2 regular Abdomen:soft non tender, non distended Extremities:no edema right AKA, left big toe amputation Neurology: Non focal Skin: no rash    Assessment/Plan: Pulmonary embolism with right lung infarct:  Pleuritic pain has resolved-on Eliquis Etiology likely due to noncompliance Indefinite anticoagulation-given prior episode of VTE  Chronic HFpEF/HFrEF:  Euvolemic-continue Lasix/Coreg/lisinopril. Outpatient cardiology follow-up-if he is compliant with medication-could be switched to Entresto   Cough:  Suspect from GERD/lung infarct Resolved  Suicidal ideation: Appreciate psych input - now stable, cleared by psych.  HTN  Stable-continue amlodipine/Coreg/lisinopril  History of bipolar disorder Continue Abilify.   History of CVA with right hemiparesis/facial droop On anticoagulation  Peripheral neuropathy: Continue Neurontin and as needed oxycodone.   DM-2 (A1c 6.0 on 9/17):  CBG stable with SSI.     Recent Labs    09/13/22 1613 09/13/22 2129 09/14/22 0726  GLUCAP 159* 119* 112*     History of right AKA-left great toe amputation  Left medial malleoli ulcer Wound care evaluation  COPD Not in exacerbation-continue Brovana/Yupelri/budesonide- will convert to inhaler regimen when closer to discharge.  Polysubstance abuse-cocaine Counseled  Homelessness:  Very vague about his situation-claims he sleeps wherever he can-eats whatever he can get-claims he is on a wheelchair (right AKA/right hemiparesis).   Social worker following.  BMI: Estimated body mass index is 24.55 kg/m as calculated from the following:   Height as of this encounter: 5\' 9"  (1.753 m).   Weight as of this  encounter: 75.4 kg.   Code status:  Code Status: Full Code   DVT Prophylaxis: apixaban (ELIQUIS) tablet 5 mg    Family Communication: None at bedside  Disposition Plan: Status is: Inpatient Medically stable await safe disposition, TOC working on it.  Diet: Diet Order             Diet regular Room service appropriate? Yes; Fluid consistency: Thin  Diet effective now                    MEDICATIONS: Scheduled Meds:  amLODipine  10 mg Oral Daily   apixaban  5 mg Oral BID   arformoterol  15 mcg Nebulization BID   ARIPiprazole  10 mg Oral Daily   budesonide (PULMICORT) nebulizer solution  0.25 mg Nebulization BID   carvedilol  3.125 mg Oral BID WC   escitalopram  5 mg Oral Daily   folic acid  1 mg Oral Daily   furosemide  40 mg Oral BID   gabapentin  400 mg Oral TID   insulin aspart  0-9 Units Subcutaneous TID WC   lisinopril  10 mg Oral Daily   revefenacin  175 mcg Nebulization Daily   thiamine  100 mg Oral Daily   Continuous Infusions: PRN Meds:.hydrALAZINE, mouth rinse, oxyCODONE   I have personally reviewed following labs and imaging studies  LABORATORY DATA:  Recent Labs  Lab 09/09/22 0002  WBC 5.9  HGB 12.5*  HCT 38.9*  PLT 321  MCV 90.3  MCH 29.0  MCHC 32.1  RDW 17.5*     Recent Labs  Lab 09/09/22 0640  NA 135  K 3.8  CL 103  CO2 24  GLUCOSE 87  BUN 15  CREATININE 0.82  CALCIUM 9.2     RADIOLOGY STUDIES/RESULTS: No results found.   LOS: 28 days   Signature  Oren Binet M.D on 09/14/2022 at 10:18 AM   -  To page go to [email or web address]

## 2022-09-15 LAB — GLUCOSE, CAPILLARY
Glucose-Capillary: 102 mg/dL — ABNORMAL HIGH (ref 70–99)
Glucose-Capillary: 100 mg/dL — ABNORMAL HIGH (ref 70–99)
Glucose-Capillary: 95 mg/dL (ref 70–99)
Glucose-Capillary: 87 mg/dL (ref 70–99)

## 2022-09-15 NOTE — Plan of Care (Signed)
  Problem: Clinical Measurements: Goal: Will remain free from infection Outcome: Completed/Met Goal: Diagnostic test results will improve Outcome: Completed/Met

## 2022-09-15 NOTE — Progress Notes (Signed)
PROGRESS NOTE        PATIENT DETAILS Name: [name] Age: 56 y.o. Sex: male Date of Birth: [birth year] Admit Date: 08/16/2022 Admitting Physician Lurline Del, MD IWP:[id number], Chales Abrahams, NP  Brief Summary:   Patient is a 56 y.o.  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  He was subsequently started on anticoagulation and admitted to the hospitalist service.  Further hospital course was complicated by patient having suicidal ideation-psychiatry was consulted-bedside sitter was placed-with recommendations to pursue inpatient psychiatric admission.  Later patient was cleared by psychiatry, no indication for inpatient psych admission, social worker assisting with SNF placement.  Significant events: 5/4-5/12>> hospitalization for sepsis from cellulitis from chronic lower extremity wound. 8/12-8/25>> hospitalization for HFrEF exacerbation/hypoxia 9/16>> admit to [facility]-pleuritic chest pain/shortness of breath-CTA with bilateral PE and pulmonary infarct.  Significant studies: 9/16>> CTA chest: Bilateral PE-right lung infarct. 9/16>> bilateral lower extremity angiogram: No major stenosis. 9/17>> x-ray left ankle: No fracture/dislocation. 9/17>> UDS: Positive for opiates/cocaine 9/18>> lower extremity Doppler: No DVT 9/18>> Limited echo: EF<20%, RV systolic function moderately reduced. 9/20>> CXR: Slight worsening of peripheral density in the right mid/lower lung.  Significant microbiology data:  None  Procedures:   None  Consults:   Psychiatry  Subjective:   Lying comfortably in bed-no major issues overnight.  Objective: Vitals: Blood pressure 124/78, pulse 81,  temperature 97.8 F (36.6 C), temperature source Oral, resp. rate 19, height 5\' 9"  (1.753 m), weight 75.4 kg, SpO2 100 %.   Exam: Gen Exam:Alert awake-not in any distress HEENT:atraumatic, normocephalic Chest: B/L clear to auscultation anteriorly CVS:S1S2 regular Abdomen:soft non tender, non distended Extremities:no edema right AKA, left big toe amputation Neurology: Non focal Skin: no rash    Assessment/Plan: Pulmonary embolism with right lung infarct:  Pleuritic pain has resolved-on Eliquis Etiology likely due to noncompliance Indefinite anticoagulation-given prior episode of VTE  Chronic HFpEF/HFrEF:  Euvolemic-continue Lasix/Coreg/lisinopril. Outpatient cardiology follow-up-if he is compliant with medication-could be switched to Entresto   Cough:  Suspect from GERD/lung infarct Resolved  Suicidal ideation: Appreciate psych input - now stable, cleared by psych.  HTN  Stable-continue amlodipine/Coreg/lisinopril  History of bipolar disorder Continue Abilify.   History of CVA with right hemiparesis/facial droop On anticoagulation  Peripheral neuropathy: Continue Neurontin and as needed oxycodone.   DM-2 (A1c 6.0 on 9/17):  CBG stable with SSI.     Recent Labs    09/14/22 1116 09/14/22 1633 09/14/22 2123  GLUCAP 73 98 117*     History of right AKA-left great toe amputation  Left medial malleoli ulcer Wound care evaluation  COPD Not in exacerbation-continue Brovana/Yupelri/budesonide- will convert to inhaler regimen when closer to discharge.  Polysubstance abuse-cocaine Counseled  Homelessness:  Very vague about his situation-claims he sleeps wherever he can-eats whatever he can get-claims he is on a wheelchair (right AKA/right hemiparesis).   Social worker following.  BMI: Estimated body mass index is 24.55 kg/m as calculated from the following:   Height as of this encounter: 5\' 9"  (1.753 m).   Weight as of this encounter: 75.4 kg.   Code  status:   Code Status: Full Code  DVT Prophylaxis: apixaban (ELIQUIS) tablet 5 mg    Family Communication: None at bedside  Disposition Plan: Status is: Inpatient Medically stable await safe disposition, TOC working on it.  Diet: Diet Order             Diet regular Room service appropriate? Yes; Fluid consistency: Thin  Diet effective now                    MEDICATIONS: Scheduled Meds:  amLODipine  10 mg Oral Daily   apixaban  5 mg Oral BID   arformoterol  15 mcg Nebulization BID   ARIPiprazole  10 mg Oral Daily   budesonide (PULMICORT) nebulizer solution  0.25 mg Nebulization BID   carvedilol  3.125 mg Oral BID WC   escitalopram  5 mg Oral Daily   folic acid  1 mg Oral Daily   furosemide  40 mg Oral BID   gabapentin  400 mg Oral TID   insulin aspart  0-9 Units Subcutaneous TID WC   lisinopril  10 mg Oral Daily   revefenacin  175 mcg Nebulization Daily   thiamine  100 mg Oral Daily   Continuous Infusions: PRN Meds:.hydrALAZINE, mouth rinse, oxyCODONE   I have personally reviewed following labs and imaging studies  LABORATORY DATA:  Recent Labs  Lab 09/09/22 0002  WBC 5.9  HGB 12.5*  HCT 38.9*  PLT 321  MCV 90.3  MCH 29.0  MCHC 32.1  RDW 17.5*     Recent Labs  Lab 09/09/22 0640  NA 135  K 3.8  CL 103  CO2 24  GLUCOSE 87  BUN 15  CREATININE 0.82  CALCIUM 9.2     RADIOLOGY STUDIES/RESULTS: No results found.   LOS: 29 days   Signature  Oren Binet M.D on 09/15/2022 at 10:05 AM   -  To page go to [email or web address]

## 2022-09-15 NOTE — TOC Progression Note (Signed)
Transition of Care [facility]) - Initial/Assessment Note    Patient Details  Name: [name] MRN: [id number] Date of Birth: 1966/08/24  Transition of [facility]) CM/SW Contact:    Milinda Antis, LCSWA Phone Number: 09/15/2022, 10:39 AM  Clinical Narrative:                 [name] contacted the admissions director, Hilda Blades, at [facility] to inquire about the facility accepting the patient.  There was no answer.  CSW left a VM requesting a returned call.   CSW contacted [facility] now that the patient is able to get around the hospital in the wheelchair by himself.  CSW spoke with Sharyn Lull in admissions and was informed that the facility is still unable to accept the patient.    TOC will continue to follow.  Expected Discharge Plan: Skilled Nursing Facility Barriers to Discharge: [place] (PASRR), Insurance Authorization, SNF Pending bed offer, Active Substance Use - Placement, Homeless with medical needs   Patient Goals and CMS Choice Patient states their goals for this hospitalization and ongoing recovery are:: Rehab CMS Medicare.gov Compare Post Acute Care list provided to:: Patient Choice offered to / list presented to : Patient  Expected Discharge Plan and Services Expected Discharge Plan: [place] In-house Referral: Clinical Social Work   Post Acute Care Choice: [place] Living arrangements for the past 2 months: Homeless                                      Prior Living Arrangements/Services Living arrangements for the past 2 months: Homeless Lives with:: Self Patient language and need for interpreter reviewed:: Yes Do you feel safe going back to the place where you live?: Yes      Need for Family Participation in Patient Care: No (Comment) Care giver support system in place?: No (comment) Current home services: DME Administrator, Civil Service) Criminal Activity/Legal Involvement Pertinent to Current  Situation/Hospitalization: No - Comment as needed  Activities of Daily Living      Permission Sought/Granted                  Emotional Assessment Appearance:: Appears stated age     Orientation: : Oriented to Self, Oriented to Place, Oriented to  Time, Oriented to Situation Alcohol / Substance Use: Illicit Drugs Psych Involvement: Yes (comment)  Admission diagnosis:  Pulmonary emboli (HCC) [I26.99] Acute pulmonary embolism, unspecified pulmonary embolism type, unspecified whether acute cor pulmonale present (HCC) [I26.99] Patient Active Problem List   Diagnosis Date Noted   Pulmonary emboli ([place]) 08/17/2022   Venous ulcer ([place]) 07/23/2022   Sinus bradycardia 07/23/2022   Anemia of chronic disease 07/23/2022   S/P AKA (above knee amputation), right ([place]) 07/23/2022   Acute on chronic systolic congestive heart failure ([place]) 07/12/2022   History of DVT (deep vein thrombosis) 07/12/2022   CAP (community acquired pneumonia) 07/12/2022   Chronic venous hypertension (idiopathic) with ulcer and inflammation of bilateral lower extremity ([place])    Sepsis due to cellulitis ([place]) 04/04/2022   Normocytic anemia 04/04/2022   Acute deep vein thrombosis (DVT) of brachial vein ([place]) 03/31/2022   Swelling of lower extremity 03/23/2022   SIRS (systemic inflammatory response syndrome) ([place]) 03/23/2022   Leg swelling 03/23/2022   Acute on chronic systolic heart failure ([place]) 02/26/2022   Seizure ([place]) 02/26/2022   Venous stasis ulcer of right ankle limited to breakdown  of skin with varicose veins (HCC)    Hypertensive urgency 01/29/2022   Prolonged QT interval 01/29/2022   Alcohol abuse 01/29/2022   chronic ulcers 01/29/2022   Wound infection 12/04/2021   Open wounds involving multiple regions of lower extremity 12/03/2021   Mixed diabetic hyperlipidemia associated with type 2 diabetes mellitus ([place]) 12/03/2021   Cellulitis of right lower extremity 11/13/2021   Type 2 diabetes mellitus  with diabetic polyneuropathy, without long-term current use of insulin ([place]) 11/13/2021   Antisocial personality disorder ([place]) 11/04/2021   Malingering 11/04/2021   Foot pain 04/23/2021   Chronic combined systolic and diastolic congestive heart failure ([place]) 03/02/2021   History of stroke 03/02/2021   Polysubstance abuse ([place]) 03/02/2021   Skin ulcer of right great toe with necrosis of bone ([place])    Encounter for assessment of healthcare decision-making capacity    Pain, dental    Dystonia    Dysarthria    Shock liver [id number]   Acute metabolic encephalopathy [id number]   Coronary artery disease involving native coronary artery of native heart without angina pectoris 01/04/2021   Essential hypertension 01/04/2021   Bipolar I disorder ([place]) 01/04/2021   Cocaine abuse ([place]) 01/04/2021   Homelessness 01/04/2021   HLD (hyperlipidemia) 01/04/2021   Osteomyelitis of great toe (HCC)    Ulcer of great toe, left, with necrosis of bone (HCC)    Ulcer of great toe, right, with necrosis of muscle (HCC)    Acute osteomyelitis of toe, left (HCC)    Acute on chronic combined systolic and diastolic CHF (congestive heart failure) ([place]) 12/20/2020   Respiratory failure ([place])    Ischemic stroke ([place]) 12/17/2020   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   Diabetic foot ulcer ([place]) 12/08/2020   CHF (congestive heart failure) ([place]) 11/14/2020   CHF exacerbation ([place]) 11/13/2020   Hypokalemia    Hyperlipidemia    Acute hypoxemic respiratory failure ([place]) 10/26/2020   COPD with acute exacerbation ([place]) 10/14/2020   Acute on chronic combined systolic and diastolic CHF (congestive heart failure) ([place]) 10/02/2020   Heart failure with reduced ejection fraction and diastolic dysfunction ([place]) 09/07/2020   Near syncope 07/21/2020   Acid reflux 06/03/2020   Cannabis abuse 03/19/2020   Acute bronchitis due to Rhinovirus 09/08/2019   Schizoaffective disorder ([place]) 06/24/2019   SOB (shortness of breath)    QT  prolongation 05/25/2019   Cocaine abuse ([place])    Elevated troponin    Homeless    Suicidal ideation    Bilateral lower extremity pain 04/05/2019   Housing situation unstable 03/28/2019   Bilateral lower extremity edema    Acute exacerbation of CHF (congestive heart failure) ([place]) 03/04/2019   Alcohol withdrawal syndrome, with delirium ([place]) 05/03/2018   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   Mild intermittent asthma without complication [id number]   Asthma 12/26/2016   Tobacco use disorder 12/26/2016   Schizophrenia ([place]) 12/26/2016   Severe episode of recurrent major depressive disorder, with psychotic features ([place]) 10/31/2016   Cocaine abuse with cocaine-induced mood disorder ([place]) 09/06/2016   Alcohol use disorder, mild, abuse 07/31/2016   Substance induced mood disorder ([place]) 07/30/2016   Schizoaffective disorder, bipolar type ([place]) 05/31/2016   Cocaine use disorder, severe, dependence ([place]) 05/31/2016   Major depressive disorder, recurrent episode ([place]) 05/30/2016   Suicidal behavior with attempted self-injury ([place]) 05/08/2016   Acetaminophen overdose 05/06/2016   PCP:  Marliss Coots, NP Pharmacy:   [facility], [address]  [place] [address] Phone: [phone] Fax: [place] [address] Phone: [phone] Fax: [phone]     Social Determinants of Health (SDOH) Interventions    Readmission Risk Interventions    08/18/2022    9:00 AM 07/18/2022    4:11 PM 04/08/2022    1:43 PM  Readmission Risk Prevention Plan  Transportation Screening Complete Complete Complete  Medication Review (RN Care Manager) Complete Complete Complete  PCP or Specialist appointment within 3-5 days of discharge Complete Complete Complete  HRI or Home Care Consult Complete Complete Not Complete  HRI or Home Care  Consult Pt Refusal Comments   Pt is homeless  SW Recovery Care/Counseling Consult Complete Complete Complete  Palliative Care Screening Not Applicable  Not [place] Not Applicable Complete Not Applicable

## 2022-09-16 LAB — GLUCOSE, CAPILLARY
Glucose-Capillary: 122 mg/dL — ABNORMAL HIGH (ref 70–99)
Glucose-Capillary: 96 mg/dL (ref 70–99)
Glucose-Capillary: 120 mg/dL — ABNORMAL HIGH (ref 70–99)
Glucose-Capillary: 149 mg/dL — ABNORMAL HIGH (ref 70–99)

## 2022-09-16 NOTE — Progress Notes (Signed)
Occupational Therapy Treatment Patient Details Name: [name] MRN: [id number] DOB: 24-Dec-1965 Today's Date: 09/16/2022   History of present illness Pt is a 56yo M who presented 9/16 with c/o chest pain, SOB, and leg swelling. Found to have B PE. PMH includes asthma, bioplar 1, depression, schizoaffective disorder, CVA, chronic CHF, homelessness, HTN, DM2, cocaine and cannabis use and R AKA.   OT comments  Pt progressing towards goals, pt provided with red theraband and putty and verbal RUE HEP as pt reporting he does not want a handout. Pt able to perform therex seated in w/c. Pt reporting wanting to increase functionality of RUE, currently uses as an assist during ADLs, pt also educated on exercises to improve weightbearing in RUE, will benefit from reinforcement. Pt presenting with impairments listed below, will follow acutely. Continue to recommend d/c to venue listed below.   Recommendations for follow up therapy are one component of a multi-disciplinary discharge planning process, led by the attending physician.  Recommendations may be updated based on patient status, additional functional criteria and insurance authorization.    Follow Up Recommendations  No OT follow up    Assistance Recommended at Discharge PRN  Patient can return home with the following  Assist for transportation   Equipment Recommendations  Wheelchair (measurements OT);Wheelchair cushion (measurements OT)    Recommendations for Other Services PT consult    Precautions / Restrictions Precautions Precautions: Fall;Other (comment) Precaution Comments: hx of R AKA w/o prosthetic Restrictions Weight Bearing Restrictions: No       Mobility Bed Mobility               General bed mobility comments: up in w/c on entry    Transfers                         Balance Overall balance assessment: Needs assistance Sitting-balance support: No upper extremity supported Sitting balance-Leahy  Scale: Good Sitting balance - Comments: can reach toward feet in seated position                                   ADL either performed or assessed with clinical judgement   ADL Overall ADL's : Needs assistance/impaired                                            Extremity/Trunk Assessment Upper Extremity Assessment Upper Extremity Assessment: RUE deficits/detail RUE Deficits / Details: Ataxic RUE from prior CVA. Ataxic during all intentional reaching and AROM at the shoulder limited to 90 degrees. MMT 3+/5 overall distally except hand with Negative composite grip. With WB and heavy lifting, R UE tremors cease but present when attempting to reach/open small containers, etc RUE Sensation: decreased light touch RUE Coordination: decreased gross motor;decreased fine motor   Lower Extremity Assessment Lower Extremity Assessment: Defer to PT evaluation        Vision   Vision Assessment?: No apparent visual deficits   Perception Perception Perception: Not tested   Praxis Praxis Praxis: Not tested    Cognition Arousal/Alertness: Awake/alert Behavior During Therapy: WFL for tasks assessed/performed Overall Cognitive Status: Within Functional Limits for tasks assessed Area of Impairment: Attention, Memory, Following commands, Safety/judgement, Awareness, Problem solving  Current Attention Level: Selective Memory: Decreased short-term memory Following Commands: Follows one step commands consistently Safety/Judgement: Decreased awareness of safety Awareness: Emergent Problem Solving: Slow processing, Difficulty sequencing, Requires verbal cues, Requires tactile cues          Exercises Exercises: Other exercises Other Exercises Other Exercises: w/c pushups x5 Other Exercises: putty squeezes x5 RUE Other Exercises: shoulder flexion x5 RUE Other Exercises: elbow flexion/extension x5 RUE Other Exercises: pressing putty  x5    Shoulder Instructions       General Comments VSS on RA    Pertinent Vitals/ Pain       Pain Assessment Pain Assessment: Faces Pain Score: 4  Faces Pain Scale: Hurts little more Pain Location: L heel Pain Descriptors / Indicators: Discomfort Pain Intervention(s): Limited activity within patient's tolerance  Home Living                                          Prior Functioning/Environment              Frequency  Min 2X/week        Progress Toward Goals  OT Goals(current goals can now be found in the care plan section)  Progress towards OT goals: Progressing toward goals  Acute Rehab OT Goals Patient Stated Goal: to get a prosthetic OT Goal Formulation: With patient Time For Goal Achievement: 09/18/22 Potential to Achieve Goals: Good ADL Goals Pt Will Perform Upper Body Bathing: Independently;sitting Pt Will Perform Lower Body Bathing: Independently;sit to/from stand Pt Will Perform Upper Body Dressing: Independently;sitting Pt Will Perform Lower Body Dressing: Independently;sitting/lateral leans Pt Will Transfer to Toilet: Independently;squat pivot transfer;regular height toilet Pt Will Perform Toileting - Clothing Manipulation and hygiene: Independently Pt/caregiver will Perform Home Exercise Program: Increased strength;Right Upper extremity;With theraband;With theraputty;With written HEP provided;Independently Additional ADL Goal #1: Pt to demo ability to gather ADL/IADL items in all planes MOD I from a wheelchair level  Plan Discharge plan needs to be updated;Frequency needs to be updated    Co-evaluation                 AM-PAC OT "6 Clicks" Daily Activity     Outcome Measure   Help from another person eating meals?: None Help from another person taking care of personal grooming?: None Help from another person toileting, which includes using toliet, bedpan, or urinal?: None Help from another person bathing (including  washing, rinsing, drying)?: A Little Help from another person to put on and taking off regular upper body clothing?: None Help from another person to put on and taking off regular lower body clothing?: None 6 Click Score: 23    End of Session Equipment Utilized During Treatment: Other (comment) (w/c)  OT Visit Diagnosis: Unsteadiness on feet (R26.81);Muscle weakness (generalized) (M62.81);Other abnormalities of gait and mobility (R26.89)   Activity Tolerance Patient tolerated treatment well   Patient Left Other (comment) (in w/c)   Nurse Communication Mobility status        Time: 5732-2025 OT Time Calculation (min): 17 min  Charges: OT General Charges $OT Visit: 1 Visit OT Treatments $Therapeutic Activity: 8-22 mins  Lynnda Child, OTD, OTR/L Acute Rehab [phone]) [phone] - [place] 09/16/2022, 3:55 PM

## 2022-09-16 NOTE — Progress Notes (Signed)
PROGRESS NOTE        PATIENT DETAILS Name: [name] Age: 56 y.o. Sex: male Date of Birth: June 02, 1966 Admit Date: 08/16/2022 Admitting Physician Lurline Del, MD XNA:[id number], Chales Abrahams, NP  Brief Summary:   Patient is a 56 y.o.  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  He was subsequently started on anticoagulation and admitted to the hospitalist service.  Further hospital course was complicated by patient having suicidal ideation-psychiatry was consulted-bedside sitter was placed-with recommendations to pursue inpatient psychiatric admission.  Later patient was cleared by psychiatry, no indication for inpatient psych admission, social worker assisting with SNF placement.  Significant events: 5/4-5/12>> hospitalization for sepsis from cellulitis from chronic lower extremity wound. 8/12-8/25>> hospitalization for HFrEF exacerbation/hypoxia 9/16>> admit to [facility]-pleuritic chest pain/shortness of breath-CTA with bilateral PE and pulmonary infarct.  Significant studies: 9/16>> CTA chest: Bilateral PE-right lung infarct. 9/16>> bilateral lower extremity angiogram: No major stenosis. 9/17>> x-ray left ankle: No fracture/dislocation. 9/17>> UDS: Positive for opiates/cocaine 9/18>> lower extremity Doppler: No DVT 9/18>> Limited echo: EF<20%, RV systolic function moderately reduced. 9/20>> CXR: Slight worsening of peripheral density in the right mid/lower lung.  Significant microbiology data:  None  Procedures:   None  Consults:   Psychiatry  Subjective:   No major issues-awaiting safe disposition.  Lying comfortably in bed.  Denies any chest pain or shortness of  breath.  Objective: Vitals: Blood pressure (!) 134/93, pulse 89, temperature 97.8 F (36.6 C), temperature source Oral, resp. rate 17, height 5\' 9"  (1.753 m), weight 75.4 kg, SpO2 100 %.   Exam: Gen Exam:Alert awake-not in any distress HEENT:atraumatic, normocephalic Chest: B/L clear to auscultation anteriorly CVS:S1S2 regular Abdomen:soft non tender, non distended Extremities:no edema right AKA, left big toe amputation Neurology: Non focal Skin: no rash    Assessment/Plan: Pulmonary embolism with right lung infarct:  Pleuritic pain has resolved-on Eliquis Etiology likely due to noncompliance Indefinite anticoagulation-given prior episode of VTE  Chronic HFpEF/HFrEF:  Euvolemic-continue Lasix/Coreg/lisinopril. Outpatient cardiology follow-up-if he is compliant with medication-could be switched to Entresto   Cough:  Suspect from GERD/lung infarct Resolved  Suicidal ideation: Appreciate psych input - now stable, cleared by psych.  HTN  Stable-continue amlodipine/Coreg/lisinopril  History of bipolar disorder Continue Abilify.   History of CVA with right hemiparesis/facial droop On anticoagulation  Peripheral neuropathy: Continue Neurontin and as needed oxycodone.   DM-2 (A1c 6.0 on 9/17):  CBG stable with SSI.     Recent Labs    09/15/22 1619 09/15/22 2115 09/16/22 0744  GLUCAP 95 87 122*     History of right AKA-left great toe amputation  Left medial malleoli ulcer Wound care evaluation  COPD Not in exacerbation-continue Brovana/Yupelri/budesonide- will convert to inhaler regimen when closer to discharge.  Polysubstance abuse-cocaine Counseled  Homelessness:  Very vague about his situation-claims he sleeps wherever he can-eats whatever he can get-claims he is on a wheelchair (right AKA/right hemiparesis).   Social worker following.  BMI: Estimated body mass index is 24.55 kg/m as calculated from the following:   Height as of this encounter: 5'  9" (1.753 m).   Weight as of this encounter: 75.4  kg.   Code status:   Code Status: Full Code   DVT Prophylaxis: apixaban (ELIQUIS) tablet 5 mg    Family Communication: None at bedside  Disposition Plan: Status is: Inpatient Medically stable await safe disposition, TOC working on it.  Diet: Diet Order             Diet regular Room service appropriate? Yes; Fluid consistency: Thin  Diet effective now                    MEDICATIONS: Scheduled Meds:  amLODipine  10 mg Oral Daily   apixaban  5 mg Oral BID   arformoterol  15 mcg Nebulization BID   ARIPiprazole  10 mg Oral Daily   budesonide (PULMICORT) nebulizer solution  0.25 mg Nebulization BID   carvedilol  3.125 mg Oral BID WC   escitalopram  5 mg Oral Daily   folic acid  1 mg Oral Daily   furosemide  40 mg Oral BID   gabapentin  400 mg Oral TID   insulin aspart  0-9 Units Subcutaneous TID WC   lisinopril  10 mg Oral Daily   revefenacin  175 mcg Nebulization Daily   thiamine  100 mg Oral Daily   Continuous Infusions: PRN Meds:.hydrALAZINE, mouth rinse, oxyCODONE   I have personally reviewed following labs and imaging studies  LABORATORY DATA:  No results for input(s): "WBC", "HGB", "HCT", "PLT", "MCV", "MCH", "MCHC", "RDW", "LYMPHSABS", "MONOABS", "EOSABS", "BASOSABS", "BANDABS" in the last 168 hours.  Invalid input(s): "NEUTRABS", "BANDSABD"   No results for input(s): "NA", "K", "CL", "CO2", "GLUCOSE", "BUN", "CREATININE", "CALCIUM", "AST", "ALT", "ALKPHOS", "BILITOT", "ALBUMIN", "MG", "PHOS", "CRP", "DDIMER", "PROCALCITON", "LATICACIDVEN", "INR", "TSH", "CORTISOL", "HGBA1C", "AMMONIA", "BNP" in the last 168 hours.  Invalid input(s): "GFRCGP", "ARSCOV2NAA"   RADIOLOGY STUDIES/RESULTS: No results found.   LOS: 30 days   Signature  Oren Binet M.D on 09/16/2022 at 10:41 AM   -  To page go to [email or web address]

## 2022-09-16 NOTE — TOC Progression Note (Signed)
Transition of Care [facility]) - Initial/Assessment Note    Patient Details  Name: [name] MRN: [id number] Date of Birth: 04-04-1966  Transition of [facility]) CM/SW Contact:    Milinda Antis, LCSWA Phone Number: 09/16/2022, 3:43 PM  Clinical Narrative:                 [name] notified that [facility] is unable to accept the patient. [name] contacted admissions at [facility] and is awaiting a determination.    CSW also contacted the following agencies:  [facility]- Still unable to accept [employer] Unable to accept [facility]- unable to contact [facility]= unable to accept  [facility]- unable to accept [facility] [employer]- left VM requesting a returned call [facility]- unable to accept [facility]-unable to accept  16:12-  CSW received a returned call from admissions with [facility].  The SNF is unable to extend a bed offer.   [name] will continue to follow.  Expected Discharge Plan: Skilled Nursing Facility Barriers to Discharge: [place] (PASRR), Insurance Authorization, SNF Pending bed offer, Active Substance Use - Placement, Homeless with medical needs   Patient Goals and CMS Choice Patient states their goals for this hospitalization and ongoing recovery are:: Rehab CMS Medicare.gov Compare Post Acute Care list provided to:: Patient Choice offered to / list presented to : Patient  Expected Discharge Plan and Services Expected Discharge Plan: [place] In-house Referral: Clinical Social Work   Post Acute Care Choice: [place] Living arrangements for the past 2 months: Homeless                                      Prior Living Arrangements/Services Living arrangements for the past 2 months: Homeless Lives with:: Self Patient language and need for interpreter reviewed:: Yes Do you feel safe going back to the place where you live?: Yes      Need for Family Participation in  Patient Care: No (Comment) Care giver support system in place?: No (comment) Current home services: DME Administrator, Civil Service) Criminal Activity/Legal Involvement Pertinent to Current Situation/Hospitalization: No - Comment as needed  Activities of Daily Living      Permission Sought/Granted                  Emotional Assessment Appearance:: Appears stated age     Orientation: : Oriented to Self, Oriented to Place, Oriented to  Time, Oriented to Situation Alcohol / Substance Use: Illicit Drugs Psych Involvement: Yes (comment)  Admission diagnosis:  Pulmonary emboli (HCC) [I26.99] Acute pulmonary embolism, unspecified pulmonary embolism type, unspecified whether acute cor pulmonale present (HCC) [I26.99] Patient Active Problem List   Diagnosis Date Noted   Pulmonary emboli ([place]) 08/17/2022   Venous ulcer ([place]) 07/23/2022   Sinus bradycardia 07/23/2022   Anemia of chronic disease 07/23/2022   S/P AKA (above knee amputation), right ([place]) 07/23/2022   Acute on chronic systolic congestive heart failure ([place]) 07/12/2022   History of DVT (deep vein thrombosis) 07/12/2022   CAP (community acquired pneumonia) 07/12/2022   Chronic venous hypertension (idiopathic) with ulcer and inflammation of bilateral lower extremity ([place])    Sepsis due to cellulitis ([place]) 04/04/2022   Normocytic anemia 04/04/2022   Acute deep vein thrombosis (DVT) of brachial vein ([place]) 03/31/2022   Swelling of lower extremity 03/23/2022   SIRS (systemic inflammatory response syndrome) ([place]) 03/23/2022   Leg swelling 03/23/2022  Acute on chronic systolic heart failure (HCC) 02/26/2022   Seizure (HCC) 02/26/2022   Venous stasis ulcer of right ankle limited to breakdown of skin with varicose veins (HCC)    Hypertensive urgency 01/29/2022   Prolonged QT interval 01/29/2022   Alcohol abuse 01/29/2022   chronic ulcers 01/29/2022   Wound infection 12/04/2021   Open wounds involving multiple regions of lower extremity  12/03/2021   Mixed diabetic hyperlipidemia associated with type 2 diabetes mellitus (HCC) 12/03/2021   Cellulitis of right lower extremity 11/13/2021   Type 2 diabetes mellitus with diabetic polyneuropathy, without long-term current use of insulin (HCC) 11/13/2021   Antisocial personality disorder (HCC) 11/04/2021   Malingering 11/04/2021   Foot pain 04/23/2021   Chronic combined systolic and diastolic congestive heart failure (HCC) 03/02/2021   History of stroke 03/02/2021   Polysubstance abuse (HCC) 03/02/2021   Skin ulcer of right great toe with necrosis of bone (HCC)    Encounter for assessment of healthcare decision-making capacity    Pain, dental    Dystonia    Dysarthria    Shock liver 01/04/2021   Acute metabolic encephalopathy 01/04/2021   Coronary artery disease involving native coronary artery of native heart without angina pectoris 01/04/2021   Essential hypertension 01/04/2021   Bipolar I disorder (HCC) 01/04/2021   Cocaine abuse (HCC) 01/04/2021   Homelessness 01/04/2021   HLD (hyperlipidemia) 01/04/2021   Osteomyelitis of great toe (HCC)    Ulcer of great toe, left, with necrosis of bone (HCC)    Ulcer of great toe, right, with necrosis of muscle (HCC)    Acute osteomyelitis of toe, left (HCC)    Acute on chronic combined systolic and diastolic CHF (congestive heart failure) (HCC) 12/20/2020   Respiratory failure (HCC)    Ischemic stroke (HCC) 12/17/2020   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   Diabetic foot ulcer (HCC) 12/08/2020   CHF (congestive heart failure) (HCC) 11/14/2020   CHF exacerbation (HCC) 11/13/2020   Hypokalemia    Hyperlipidemia    Acute hypoxemic respiratory failure (HCC) 10/26/2020   COPD with acute exacerbation (HCC) 10/14/2020   Acute on chronic combined systolic and diastolic CHF (congestive heart failure) (HCC) 10/02/2020   Heart failure with reduced ejection fraction and diastolic dysfunction (HCC) 09/07/2020   Near syncope 07/21/2020    Acid reflux 06/03/2020   Cannabis abuse 03/19/2020   Acute bronchitis due to Rhinovirus 09/08/2019   Schizoaffective disorder (HCC) 06/24/2019   SOB (shortness of breath)    QT prolongation 05/25/2019   Cocaine abuse (HCC)    Elevated troponin    Homeless    Suicidal ideation    Bilateral lower extremity pain 04/05/2019   Housing situation unstable 03/28/2019   Bilateral lower extremity edema    Acute exacerbation of CHF (congestive heart failure) (HCC) 03/04/2019   Alcohol withdrawal syndrome, with delirium (HCC) 05/03/2018   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   Mild intermittent asthma without complication 08/28/2017   Asthma 12/26/2016   Tobacco use disorder 12/26/2016   Schizophrenia (HCC) 12/26/2016   Severe episode of recurrent major depressive disorder, with psychotic features (HCC) 10/31/2016   Cocaine abuse with cocaine-induced mood disorder (HCC) 09/06/2016   Alcohol use disorder, mild, abuse 07/31/2016   Substance induced mood disorder (HCC) 07/30/2016   Schizoaffective disorder, bipolar type (HCC) 05/31/2016   Cocaine use disorder, severe, dependence (HCC) 05/31/2016   Major depressive disorder, recurrent episode (HCC) 05/30/2016   Suicidal behavior with attempted self-injury (HCC) 05/08/2016   Acetaminophen overdose  05/06/2016   PCP:  [name], NP Pharmacy:   [facility] [place], [name] - [place] [address] [name] [address] Phone: [phone] Fax: [phone]  [facility] [address] [name] [address] Phone: [phone] Fax: [phone]     Social Determinants of Health (SDOH) Interventions    Readmission Risk Interventions    08/18/2022    9:00 AM 07/18/2022    4:11 PM 04/08/2022    1:43 PM  Readmission Risk Prevention Plan  Transportation Screening Complete Complete Complete  Medication Review (RN Care Manager) Complete  Complete Complete  PCP or Specialist appointment within 3-5 days of discharge Complete Complete Complete  HRI or Home Care Consult Complete Complete Not Complete  HRI or Home Care Consult Pt Refusal Comments   Pt is homeless  SW Recovery Care/Counseling Consult Complete Complete Complete  Palliative Care Screening Not Applicable  Not Applicable  Skilled Nursing Facility Not Applicable Complete Not Applicable

## 2022-09-16 NOTE — Progress Notes (Signed)
Mobility Specialist Progress Note:   09/16/22 1125  Mobility  Activity  (self-propelled w/c)  Level of Assistance Standby assist, set-up cues, supervision of patient - no hands on  Assistive Device Wheelchair  Distance Ambulated (ft) 500 ft  Activity Response Tolerated well  Mobility Referral Yes  $Mobility charge 1 Mobility   Pt received self-propelling in w/c in hallway. No physical assistance throughout. Pt c/o consistent L foot pain. Back in room with all needs met.  Nelta Numbers Acute Rehab Secure Chat or Office Phone: [phone]

## 2022-09-17 DIAGNOSIS — Z5900 Homelessness unspecified: Secondary | ICD-10-CM

## 2022-09-17 LAB — GLUCOSE, CAPILLARY
Glucose-Capillary: 94 mg/dL (ref 70–99)
Glucose-Capillary: 111 mg/dL — ABNORMAL HIGH (ref 70–99)
Glucose-Capillary: 115 mg/dL — ABNORMAL HIGH (ref 70–99)
Glucose-Capillary: 116 mg/dL — ABNORMAL HIGH (ref 70–99)

## 2022-09-17 NOTE — Progress Notes (Signed)
Mobility Specialist Progress Note:   09/17/22 1230  Mobility  Activity Transferred from bed to chair (+ self-propel)  Level of Assistance Standby assist, set-up cues, supervision of patient - no hands on  Assistive Device Wheelchair  Distance Ambulated (ft) 1000 ft  Activity Response Tolerated well  Mobility Referral Yes  $Mobility charge 1 Mobility   Pt eager for mobility session. No physical assistance required throughout. Pt back in room with all needs met, eating lunch.   Nelta Numbers Acute Rehab Secure Chat or Office Phone: [phone]

## 2022-09-17 NOTE — Plan of Care (Signed)
  Problem: Skin Integrity: Goal: Risk for impaired skin integrity will decrease Outcome: Progressing   Problem: Safety: Goal: Ability to remain free from injury will improve Outcome: Progressing   Problem: Pain Managment: Goal: General experience of comfort will improve Outcome: Progressing   Problem: Elimination: Goal: Will not experience complications related to bowel motility Outcome: Progressing Goal: Will not experience complications related to urinary retention Outcome: Progressing   Problem: Coping: Goal: Level of anxiety will decrease Outcome: Progressing   Problem: Nutrition: Goal: Adequate nutrition will be maintained Outcome: Progressing   Problem: Activity: Goal: Risk for activity intolerance will decrease Outcome: Progressing   Problem: Clinical Measurements: Goal: Ability to maintain clinical measurements within normal limits will improve Outcome: Progressing Goal: Respiratory complications will improve Outcome: Progressing Goal: Cardiovascular complication will be avoided Outcome: Progressing   Problem: Health Behavior/Discharge Planning: Goal: Ability to manage health-related needs will improve Outcome: Progressing

## 2022-09-17 NOTE — Progress Notes (Signed)
PROGRESS NOTE    [name]  GQQ:[id number] DOB: [birth year] DOA: 08/16/2022 PCP: [name] Coots, NP   Brief Narrative:   56 y.o.  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  He was subsequently started on anticoagulation and admitted to the hospitalist service.  Limited echo showed EF of less than 20%.  Further hospital course was complicated by patient having suicidal ideation-psychiatry was consulted-bedside sitter was placed-with recommendations to pursue inpatient psychiatric admission.  Later patient was cleared by psychiatry, no indication for inpatient psych admission, social worker assisting with SNF placement currently medically stable for discharge.  Assessment & Plan:  Pulmonary embolism with right lung infarct:  Etiology likely due to noncompliance Indefinite anticoagulation-given prior episode of VTE   Chronic HFpEF/HFrEF:  Euvolemic-continue Lasix/Coreg/lisinopril. Outpatient cardiology follow-up-if he is compliant with medication-could be switched to Entresto    Suicidal ideation: Appreciate psych input - now stable, cleared by psych.  Outpatient follow-up with psychiatry.  Continue escitalopram, aripiprazole   HTN  Stable-continue amlodipine/Coreg/lisinopril   History of bipolar disorder Continue Abilify.    History of CVA with right hemiparesis/facial droop On anticoagulation   Peripheral neuropathy: Continue Neurontin and as needed oxycodone.    DM-2 (A1c 6.0 on 9/17):  Continue CBGs with SSI   History of right AKA-left great toe amputation   Left medial malleoli ulcer Wound care as per wound care nurse recommendations.   COPD Not in exacerbation-continue  Brovana/Yupelri/budesonide- will convert to inhaler regimen when closer to discharge.   Polysubstance abuse-cocaine Counseled   Homelessness:  Very vague about his situation-claims he sleeps wherever he can-eats whatever he can get-claims he is on a wheelchair (right AKA/right hemiparesis).   Social worker following.  DVT prophylaxis: Eliquis Code Status: Full Family Communication: None at bedside Disposition Plan: Status is: Inpatient Remains inpatient appropriate because: Awaiting safe disposition.  Currently medically stable for discharge.    Consultants: Psychiatry  Procedures: None  Antimicrobials: None currently   Subjective: Patient seen and examined at bedside.  Poor historian.  No seizures, vomiting, agitation or chest pain reported.  Objective: Vitals:   09/16/22 1927 09/16/22 2121 09/17/22 0608 09/17/22 0909  BP:  138/80 (!) 139/96 112/80  Pulse:  71 67 80  Resp:  16 16 18   Temp:  97.9 F (36.6 C) 97.8 F (36.6 C) 98.5 F (36.9 C)  TempSrc:  Oral Oral Oral  SpO2: 99% 97% 100% 100%  Weight:      Height:        Intake/Output Summary (Last 24 hours) at 09/17/2022 1107 Last data filed at 09/17/2022 0600 Gross per 24 hour  Intake 695 ml  Output 900 ml  Net -205 ml   Filed Weights   08/16/22 1543 09/11/22 0502  Weight: 86.2 kg 75.4 kg    Examination:  General exam: Appears calm and comfortable.  Currently on room air.  Poor historian.  Slow to respond.  Flat affect. Respiratory system: Bilateral decreased breath sounds at bases, no wheezing Cardiovascular system: S1 & S2 heard, Rate controlled Gastrointestinal system: Abdomen is nondistended, soft and nontender. Normal bowel sounds heard. Extremities: Right AKA present.  Left great toe amputation present   Data Reviewed: I have personally reviewed following labs and imaging studies  CBC: No results for input(s): "WBC", "  NEUTROABS", "HGB", "HCT", "MCV", "PLT" in the last 168 hours. Basic  Metabolic Panel: No results for input(s): "NA", "K", "CL", "CO2", "GLUCOSE", "BUN", "CREATININE", "CALCIUM", "MG", "PHOS" in the last 168 hours. GFR: Estimated Creatinine Clearance: 103 mL/min (by C-G formula based on SCr of 0.82 mg/dL). Liver Function Tests: No results for input(s): "AST", "ALT", "ALKPHOS", "BILITOT", "PROT", "ALBUMIN" in the last 168 hours. No results for input(s): "LIPASE", "AMYLASE" in the last 168 hours. No results for input(s): "AMMONIA" in the last 168 hours. Coagulation Profile: No results for input(s): "INR", "PROTIME" in the last 168 hours. Cardiac Enzymes: No results for input(s): "CKTOTAL", "CKMB", "CKMBINDEX", "TROPONINI" in the last 168 hours. BNP (last 3 results) No results for input(s): "PROBNP" in the last 8760 hours. HbA1C: No results for input(s): "HGBA1C" in the last 72 hours. CBG: Recent Labs  Lab 09/16/22 0744 09/16/22 1117 09/16/22 1625 09/16/22 2022 09/17/22 0913  GLUCAP 122* 96 120* 149* 94   Lipid Profile: No results for input(s): "CHOL", "HDL", "LDLCALC", "TRIG", "CHOLHDL", "LDLDIRECT" in the last 72 hours. Thyroid Function Tests: No results for input(s): "TSH", "T4TOTAL", "FREET4", "T3FREE", "THYROIDAB" in the last 72 hours. Anemia Panel: No results for input(s): "VITAMINB12", "FOLATE", "FERRITIN", "TIBC", "IRON", "RETICCTPCT" in the last 72 hours. Sepsis Labs: No results for input(s): "PROCALCITON", "LATICACIDVEN" in the last 168 hours.  No results found for this or any previous visit (from the past 240 hour(s)).       Radiology Studies: No results found.      Scheduled Meds:  amLODipine  10 mg Oral Daily   apixaban  5 mg Oral BID   arformoterol  15 mcg Nebulization BID   ARIPiprazole  10 mg Oral Daily   budesonide (PULMICORT) nebulizer solution  0.25 mg Nebulization BID   carvedilol  3.125 mg Oral BID WC   escitalopram  5 mg Oral Daily   folic acid  1 mg Oral Daily   furosemide  40 mg Oral BID   gabapentin  400  mg Oral TID   insulin aspart  0-9 Units Subcutaneous TID WC   lisinopril  10 mg Oral Daily   revefenacin  175 mcg Nebulization Daily   thiamine  100 mg Oral Daily   Continuous Infusions:        Aline August, MD [facility] 09/17/2022, 11:07 AM

## 2022-09-17 NOTE — Progress Notes (Signed)
Physical Therapy Treatment Patient Details Name: [name] MRN: [id number] DOB: 1966-09-02 Today's Date: 09/17/2022   History of Present Illness Pt is a 56yo M who presented 9/16 with c/o chest pain, SOB, and leg swelling. Found to have B PE. PMH includes asthma, bioplar 1, depression, schizoaffective disorder, CVA, chronic CHF, homelessness, HTN, DM2, cocaine and cannabis use and R AKA.    PT Comments    Patient up in wheelchair on arrival and agrees to work on ambulation distance. Patient walked multiple times with RW and minguard assist with max distance at one time =20 ft. He reports feeling short of breath and needs to sit and rest (no notable dyspnea with sats >95%). Total distance with multiple rest breaks =75 ft. Patient independently propels wheelchair back to room.     Recommendations for follow up therapy are one component of a multi-disciplinary discharge planning process, led by the attending physician.  Recommendations may be updated based on patient status, additional functional criteria and insurance authorization.  Follow Up Recommendations  Skilled nursing-short term rehab (<3 hours/day) Can patient physically be transported by private vehicle: Yes   Assistance Recommended at Discharge Set up Supervision/Assistance  Patient can return home with the following A little help with walking and/or transfers;A little help with bathing/dressing/bathroom   Equipment Recommendations  Wheelchair (measurements PT);Wheelchair cushion (measurements PT)    Recommendations for Other Services       Precautions / Restrictions Precautions Precautions: Fall;Other (comment) Precaution Comments: hx of R AKA w/o prosthetic Restrictions Weight Bearing Restrictions: No     Mobility  Bed Mobility               General bed mobility comments: up in w/c on entry    Transfers Overall transfer level: Needs assistance Equipment used: Rolling walker (2 wheels) Transfers: Bed  to chair/wheelchair/BSC, Sit to/from Stand Sit to Stand: Min guard           General transfer comment: stood from w/c x 6 reps (after each seated rest while ambulating)    Ambulation/Gait Ambulation/Gait assistance: Min guard Gait Distance (Feet): 8 Feet (12, 15, 20, 12, 15 (seated rests between due to feeling SOB)) Assistive device: Rolling walker (2 wheels) Gait Pattern/deviations: Step-to pattern           Psychologist, counselling propulsion: Both upper extremities Wheelchair parts: Independent Distance: 75  Modified Rankin (Stroke Patients Only)       Balance Overall balance assessment: Needs assistance Sitting-balance support: No upper extremity supported Sitting balance-Leahy Scale: Good Sitting balance - Comments: can reach toward feet in seated position   Standing balance support: During functional activity, Bilateral upper extremity supported Standing balance-Leahy Scale: Poor Standing balance comment: requires UE support in standing due to R AKA and has not received prosthetic yet                            Cognition Arousal/Alertness: Awake/alert Behavior During Therapy: WFL for tasks assessed/performed Overall Cognitive Status: Within Functional Limits for tasks assessed                                 General Comments: good awareness of deficits; good safety with w/c        Exercises      General Comments General comments (skin integrity,  edema, etc.): VSS      Pertinent Vitals/Pain Pain Assessment Pain Assessment: Faces Faces Pain Scale: Hurts little more Pain Location: L heel Pain Descriptors / Indicators: Discomfort, Burning Pain Intervention(s): Limited activity within patient's tolerance    Home Living                          Prior Function            PT Goals (current goals can now be found in the care plan section) Acute Rehab PT  Goals Patient Stated Goal: get a power chair PT Goal Formulation: With patient Time For Goal Achievement: 09/23/22 Potential to Achieve Goals: Good Progress towards PT goals: Progressing toward goals    Frequency    Min 2X/week      PT Plan Current plan remains appropriate    Co-evaluation              AM-PAC PT "6 Clicks" Mobility   Outcome Measure  Help needed turning from your back to your side while in a flat bed without using bedrails?: None Help needed moving from lying on your back to sitting on the side of a flat bed without using bedrails?: None Help needed moving to and from a bed to a chair (including a wheelchair)?: A Little Help needed standing up from a chair using your arms (e.g., wheelchair or bedside chair)?: A Little Help needed to walk in hospital room?: A Lot Help needed climbing 3-5 steps with a railing? : Total 6 Click Score: 17    End of Session Equipment Utilized During Treatment: Gait belt Activity Tolerance: Patient tolerated treatment well Patient left: in chair;with family/visitor present (up in [facility]) Nurse Communication: Mobility status PT Visit Diagnosis: Other abnormalities of gait and mobility (R26.89)     Time: 2774-1287 PT Time Calculation (min) (ACUTE ONLY): 19 min  Charges:  $Gait Training: 8-22 mins                      [name]  Office [phone]    Rexanne Mano 09/17/2022, 11:19 AM

## 2022-09-18 LAB — GLUCOSE, CAPILLARY
Glucose-Capillary: 136 mg/dL — ABNORMAL HIGH (ref 70–99)
Glucose-Capillary: 89 mg/dL (ref 70–99)
Glucose-Capillary: 116 mg/dL — ABNORMAL HIGH (ref 70–99)
Glucose-Capillary: 110 mg/dL — ABNORMAL HIGH (ref 70–99)

## 2022-09-18 NOTE — Progress Notes (Signed)
PROGRESS NOTE    [name]  TDD:[id number] DOB: [birth year] DOA: 08/16/2022 PCP: Marliss Coots, NP   Brief Narrative:   56 y.o.  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  He was subsequently started on anticoagulation and admitted to the hospitalist service.  Limited echo showed EF of less than 20%.  Further hospital course was complicated by patient having suicidal ideation-psychiatry was consulted-bedside sitter was placed-with recommendations to pursue inpatient psychiatric admission.  Later patient was cleared by psychiatry, no indication for inpatient psych admission, social worker assisting with SNF placement currently medically stable for discharge.  Assessment & Plan:  Pulmonary embolism with right lung infarct:  Etiology likely due to noncompliance Indefinite anticoagulation-given prior episode of VTE   Chronic HFpEF/HFrEF:  Euvolemic-continue Lasix/Coreg/lisinopril.  Continue strict input and output, daily weights, fluid restriction. Outpatient cardiology follow-up-if he is compliant with medication-could be switched to Entresto    Suicidal ideation: Appreciate psych input - now stable, cleared by psych.  Outpatient follow-up with psychiatry.  Continue escitalopram, aripiprazole   HTN  Stable-continue amlodipine/Coreg/lisinopril   History of bipolar disorder Continue Abilify.    History of CVA with right hemiparesis/facial droop On anticoagulation   Peripheral neuropathy: Continue Neurontin and as needed oxycodone.    DM-2 (A1c 6.0 on 9/17):  Continue CBGs with SSI   History of right AKA-left great toe amputation   Left medial malleoli ulcer Wound care as per wound care  nurse recommendations.   COPD Not in exacerbation-continue Brovana/Yupelri/budesonide- will convert to inhaler regimen when closer to discharge.   Polysubstance abuse-cocaine Counseled   Homelessness:  Very vague about his situation-claims he sleeps wherever he can-eats whatever he can get-claims he is on a wheelchair (right AKA/right hemiparesis).   Social worker following.  DVT prophylaxis: Eliquis Code Status: Full Family Communication: None at bedside Disposition Plan: Status is: Inpatient Remains inpatient appropriate because: Awaiting safe disposition.  Currently medically stable for discharge.    Consultants: Psychiatry  Procedures: None  Antimicrobials: None currently   Subjective: Patient seen and examined at bedside.  Poor historian.  No fever, agitation, seizures or vomiting reported. Objective: Vitals:   09/17/22 0909 09/17/22 1652 09/17/22 2133 09/18/22 0543  BP: 112/80 127/72 (!) 135/91 136/73  Pulse: 80 73 74 (!) 58  Resp: 18 18 18 18   Temp: 98.5 F (36.9 C) 98.1 F (36.7 C) 98 F (36.7 C) 97.7 F (36.5 C)  TempSrc: Oral Oral Oral Oral  SpO2: 100% 100% 100% 100%  Weight:   75.4 kg   Height:   5\' 9"  (1.753 m)     Intake/Output Summary (Last 24 hours) at 09/18/2022 0725 Last data filed at 09/18/2022 0554 Gross per 24 hour  Intake 460 ml  Output 1550 ml  Net -1090 ml    Filed Weights   08/16/22 1543 09/11/22 0502 09/17/22 2133  Weight: 86.2 kg 75.4 kg 75.4 kg    Examination:  General exam: On room air.  No distress.  Poor historian.  Slow to respond.  Flat affect. Respiratory system: Decreased breath sounds at bases bilaterally with some scattered crackles  cardiovascular system: Rate controlled currently, S1-S2 heard Gastrointestinal system: Abdomen is distended slightly, soft and nontender.  Bowel sounds are heard  extremities: Right AKA present.  Left great  toe amputation present   Data Reviewed: I have personally reviewed following  labs and imaging studies  CBC: No results for input(s): "WBC", "NEUTROABS", "HGB", "HCT", "MCV", "PLT" in the last 168 hours. Basic Metabolic Panel: No results for input(s): "NA", "K", "CL", "CO2", "GLUCOSE", "BUN", "CREATININE", "CALCIUM", "MG", "PHOS" in the last 168 hours. GFR: Estimated Creatinine Clearance: 103 mL/min (by C-G formula based on SCr of 0.82 mg/dL). Liver Function Tests: No results for input(s): "AST", "ALT", "ALKPHOS", "BILITOT", "PROT", "ALBUMIN" in the last 168 hours. No results for input(s): "LIPASE", "AMYLASE" in the last 168 hours. No results for input(s): "AMMONIA" in the last 168 hours. Coagulation Profile: No results for input(s): "INR", "PROTIME" in the last 168 hours. Cardiac Enzymes: No results for input(s): "CKTOTAL", "CKMB", "CKMBINDEX", "TROPONINI" in the last 168 hours. BNP (last 3 results) No results for input(s): "PROBNP" in the last 8760 hours. HbA1C: No results for input(s): "HGBA1C" in the last 72 hours. CBG: Recent Labs  Lab 09/16/22 2022 09/17/22 0913 09/17/22 1158 09/17/22 1654 09/17/22 2133  GLUCAP 149* 94 111* 115* 116*    Lipid Profile: No results for input(s): "CHOL", "HDL", "LDLCALC", "TRIG", "CHOLHDL", "LDLDIRECT" in the last 72 hours. Thyroid Function Tests: No results for input(s): "TSH", "T4TOTAL", "FREET4", "T3FREE", "THYROIDAB" in the last 72 hours. Anemia Panel: No results for input(s): "VITAMINB12", "FOLATE", "FERRITIN", "TIBC", "IRON", "RETICCTPCT" in the last 72 hours. Sepsis Labs: No results for input(s): "PROCALCITON", "LATICACIDVEN" in the last 168 hours.  No results found for this or any previous visit (from the past 240 hour(s)).       Radiology Studies: No results found.      Scheduled Meds:  amLODipine  10 mg Oral Daily   apixaban  5 mg Oral BID   arformoterol  15 mcg Nebulization BID   ARIPiprazole  10 mg Oral Daily   budesonide (PULMICORT) nebulizer solution  0.25 mg Nebulization BID    carvedilol  3.125 mg Oral BID WC   escitalopram  5 mg Oral Daily   folic acid  1 mg Oral Daily   furosemide  40 mg Oral BID   gabapentin  400 mg Oral TID   insulin aspart  0-9 Units Subcutaneous TID WC   lisinopril  10 mg Oral Daily   revefenacin  175 mcg Nebulization Daily   thiamine  100 mg Oral Daily   Continuous Infusions:        Glade Lloyd, MD [facility] 09/18/2022, 7:25 AM

## 2022-09-18 NOTE — Progress Notes (Signed)
Mobility Specialist Progress Note:   09/18/22 1400  Mobility  Activity Transferred from bed to chair (self-propel in w/c)  Level of Assistance Standby assist, set-up cues, supervision of patient - no hands on  Assistive Device Wheelchair  Activity Response Tolerated well  Mobility Referral Yes  $Mobility charge 1 Mobility   Pt requesting second mobility session. Declining gait training with RW. Able to self-propel in w/c with no assistance. Pt back in room with all needs met.     Acute Rehab Secure Chat or Office Phone: [phone]  

## 2022-09-18 NOTE — Progress Notes (Signed)
Mobility Specialist Progress Note:   09/18/22 0900  Mobility  Activity Transferred from bed to chair  Level of Assistance Standby assist, set-up cues, supervision of patient - no hands on  Assistive Device Wheelchair  Distance Ambulated (ft) 500 ft  Activity Response Tolerated well  Mobility Referral Yes  $Mobility charge 1 Mobility   Pt eager for mobility session. Required no physical assistance. Left self-propelling in w/c in hallway.   Nelta Numbers Acute Rehab Secure Chat or Office Phone: [phone]

## 2022-09-19 LAB — GLUCOSE, CAPILLARY
Glucose-Capillary: 144 mg/dL — ABNORMAL HIGH (ref 70–99)
Glucose-Capillary: 140 mg/dL — ABNORMAL HIGH (ref 70–99)
Glucose-Capillary: 98 mg/dL (ref 70–99)
Glucose-Capillary: 100 mg/dL — ABNORMAL HIGH (ref 70–99)

## 2022-09-19 NOTE — Progress Notes (Signed)
Occupational Therapy Treatment Patient Details Name: [name] MRN: [id number] DOB: 12-May-1966 Today's Date: 09/19/2022   History of present illness Pt is a 56yo M who presented 9/16 with c/o chest pain, SOB, and leg swelling. Found to have B PE. PMH includes asthma, bioplar 1, depression, schizoaffective disorder, CVA, chronic CHF, homelessness, HTN, DM2, cocaine and cannabis use and R AKA.   OT comments  Patient agreeable to out of bed, and up to the wheelchair.  Setup and generalized supervision for transfers to wheelchair.  Patient is independent with wheelchair mobility in the room/toileting and mobility in the halls.  Discharge plan updated per CM note.  Patient would benefit from a more structured environment where he could pursue a prosthetic leg and progress his mobility and independence.  OT can continue to follow, and determine possible d/c at next goal update 10/24.   Recommendations for follow up therapy are one component of a multi-disciplinary discharge planning process, led by the attending physician.  Recommendations may be updated based on patient status, additional functional criteria and insurance authorization.    Follow Up Recommendations  Long-term institutional care without follow-up therapy    Assistance Recommended at Discharge Frequent or constant Supervision/Assistance  Patient can return home with the following  Assist for transportation;A little help with bathing/dressing/bathroom;A little help with walking and/or transfers   Equipment Recommendations  Wheelchair (measurements OT);Wheelchair cushion (measurements OT)    Recommendations for Other Services      Precautions / Restrictions Precautions Precautions: Fall Precaution Comments: hx of R AKA w/o prosthetic Restrictions Weight Bearing Restrictions: No       Mobility Bed Mobility Overal bed mobility: Modified Independent                  Transfers Overall transfer level: Needs  assistance Equipment used: Rolling walker (2 wheels) Transfers: Bed to chair/wheelchair/BSC, Sit to/from Stand Sit to Stand: Min guard Stand pivot transfers: Min guard               Balance Overall balance assessment: Needs assistance Sitting-balance support: Feet supported Sitting balance-Leahy Scale: Good     Standing balance support: Reliant on assistive device for balance Standing balance-Leahy Scale: Poor                             ADL either performed or assessed with clinical judgement   ADL   Eating/Feeding: Modified independent;Sitting   Grooming: Wash/dry hands;Wash/dry face;Oral care;Set up;Sitting Grooming Details (indicate cue type and reason): in wheelchair at sink                                                                    Cognition Arousal/Alertness: Awake/alert Behavior During Therapy: WFL for tasks assessed/performed Overall Cognitive Status: History of cognitive impairments - at baseline                         Following Commands: Follows one step commands consistently Safety/Judgement: Decreased awareness of safety   Problem Solving: Slow processing, Difficulty sequencing, Requires verbal cues  Pertinent Vitals/ Pain       Pain Assessment Pain Assessment: Faces Faces Pain Scale: Hurts a little bit Pain Location: foot Pain Descriptors / Indicators: Burning, Tender Pain Intervention(s): Monitored during session                                                          Frequency  Min 2X/week        Progress Toward Goals  OT Goals(current goals can now be found in the care plan section)  Progress towards OT goals: Progressing toward goals  Acute Rehab OT Goals OT Goal Formulation: With patient Time For Goal Achievement: 09/23/22 Potential to Achieve Goals: Good  Plan Discharge plan remains appropriate     Co-evaluation                 AM-PAC OT "6 Clicks" Daily Activity     Outcome Measure   Help from another person eating meals?: None Help from another person taking care of personal grooming?: None Help from another person toileting, which includes using toliet, bedpan, or urinal?: A Little Help from another person bathing (including washing, rinsing, drying)?: A Little Help from another person to put on and taking off regular upper body clothing?: None Help from another person to put on and taking off regular lower body clothing?: A Little 6 Click Score: 21    End of Session Equipment Utilized During Treatment: Rolling walker (2 wheels)  OT Visit Diagnosis: Unsteadiness on feet (R26.81);Muscle weakness (generalized) (M62.81);Other abnormalities of gait and mobility (R26.89)   Activity Tolerance Patient tolerated treatment well   Patient Left Other (comment) (wheelchair)   Nurse Communication          [id number] OT Time Calculation (min): 11 min  Charges: OT General Charges $OT Visit: 1 Visit OT Treatments $Therapeutic Activity: 8-22 mins  09/19/2022  RP, OTR/L  Acute Rehabilitation Services  Office:  [phone]   Metta Clines 09/19/2022, 11:15 AM

## 2022-09-19 NOTE — Progress Notes (Signed)
Physical Therapy Treatment Patient Details Name: [name] MRN: [id number] DOB: [birth year] Today's Date: 09/19/2022   History of Present Illness Pt is a 56yo M who presented 9/16 with c/o chest pain, SOB, and leg swelling. Found to have B PE. PMH includes asthma, bioplar 1, depression, schizoaffective disorder, CVA, chronic CHF, homelessness, HTN, DM2, cocaine and cannabis use and R AKA.    PT Comments    Pt agreeable to getting up with therapy, however experiencing pain in his L foot and heel. Pt mod I for bed mobility and min guard for transfers and hopping with RW. Pt able to mobilize 80 feet with RW and 3 seated rest breaks. Pt then independent with wheelchair propulsion back to room. Pt expresses desire to work toward prosthetic placement. Per CSW pt discharge to long term assisted living most most realistic discharge plan. D/c plan changed in accordance to this recommendation.   Recommendations for follow up therapy are one component of a multi-disciplinary discharge planning process, led by the attending physician.  Recommendations may be updated based on patient status, additional functional criteria and insurance authorization.  Follow Up Recommendations  Long-term institutional care without follow-up therapy Can patient physically be transported by private vehicle: Yes   Assistance Recommended at Discharge Set up Supervision/Assistance  Patient can return home with the following A little help with walking and/or transfers;A little help with bathing/dressing/bathroom   Equipment Recommendations  Wheelchair (measurements PT);Wheelchair cushion (measurements PT)       Precautions / Restrictions Precautions Precautions: Fall;Other (comment) Precaution Comments: hx of R AKA w/o prosthetic Restrictions Weight Bearing Restrictions: No     Mobility  Bed Mobility Overal bed mobility: Modified Independent                  Transfers Overall transfer level: Needs  assistance Equipment used: Rolling walker (2 wheels) Transfers: Bed to chair/wheelchair/BSC, Sit to/from Stand Sit to Stand: Min guard           General transfer comment: 4x sit<>stand from w/c to RW with rest breaks during ambulatio    Ambulation/Gait Ambulation/Gait assistance: Min guard Gait Distance (Feet): 80 Feet (3 rest breaks) Assistive device: Rolling walker (2 wheels) Gait Pattern/deviations: Step-to Advertising copywriter Wheelchair mobility: Yes Wheelchair propulsion: Both upper extremities Wheelchair parts: Independent Distance: 100     Balance Overall balance assessment: Needs assistance Sitting-balance support: No upper extremity supported Sitting balance-Leahy Scale: Good Sitting balance - Comments: can reach toward feet in seated position   Standing balance support: During functional activity, Bilateral upper extremity supported Standing balance-Leahy Scale: Poor Standing balance comment: requires UE support in standing due to R AKA and has not received prosthetic yet                            Cognition Arousal/Alertness: Awake/alert Behavior During Therapy: WFL for tasks assessed/performed Overall Cognitive Status: History of cognitive impairments - at baseline                                 General Comments: aware of safety in terms of his deficits           General Comments General comments (skin integrity, edema, etc.): VSS      Pertinent Vitals/Pain Pain Assessment Pain Assessment: Faces Faces Pain Scale: Hurts  little more Pain Location: L heel Pain Descriptors / Indicators: Discomfort, Burning Pain Intervention(s): Limited activity within patient's tolerance, Monitored during session, Repositioned     PT Goals (current goals can now be found in the care plan section) Acute Rehab PT Goals Patient Stated Goal: get a power chair PT Goal Formulation: With  patient Time For Goal Achievement: 09/23/22 Potential to Achieve Goals: Good Progress towards PT goals: Progressing toward goals    Frequency    Min 2X/week      PT Plan Discharge plan needs to be updated       AM-PAC PT "6 Clicks" Mobility   Outcome Measure  Help needed turning from your back to your side while in a flat bed without using bedrails?: None Help needed moving from lying on your back to sitting on the side of a flat bed without using bedrails?: None Help needed moving to and from a bed to a chair (including a wheelchair)?: None Help needed standing up from a chair using your arms (e.g., wheelchair or bedside chair)?: A Little Help needed to walk in hospital room?: A Lot Help needed climbing 3-5 steps with a railing? : Total 6 Click Score: 18    End of Session Equipment Utilized During Treatment: Gait belt Activity Tolerance: Patient tolerated treatment well Patient left: in chair (up in [facility]) Nurse Communication: Mobility status PT Visit Diagnosis: Other abnormalities of gait and mobility (R26.89)     Time: 1330-1402 PT Time Calculation (min) (ACUTE ONLY): 32 min  Charges:  $Gait Training: 8-22 mins $Therapeutic Activity: 8-22 mins                     [name] B. [name] PT, DPT Acute Rehabilitation Services Please use secure chat or  Call Office [phone]    [name] [facility] 09/19/2022, 2:31 PM

## 2022-09-19 NOTE — Progress Notes (Signed)
PROGRESS NOTE    [name]  ZOX:[id number] DOB: 1966-09-24 DOA: 08/16/2022 PCP: [name], NP   Brief Narrative:   56 y.o.  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  He was subsequently started on anticoagulation and admitted to the hospitalist service.  Limited echo showed EF of less than 20%.  Further hospital course was complicated by patient having suicidal ideation-psychiatry was consulted-bedside sitter was placed-with recommendations to pursue inpatient psychiatric admission.  Later patient was cleared by psychiatry, no indication for inpatient psych admission, social worker assisting with SNF placement currently medically stable for discharge.  Assessment & Plan:  Pulmonary embolism with right lung infarct:  Etiology likely due to noncompliance Indefinite anticoagulation-given prior episode of VTE   Chronic HFpEF/HFrEF:  Euvolemic-continue Lasix/Coreg/lisinopril.  Continue strict input and output, daily weights, fluid restriction. Outpatient cardiology follow-up-if he is compliant with medication-could be switched to Entresto    Suicidal ideation: Appreciate psych input - now stable, cleared by psych.  Outpatient follow-up with psychiatry.  Continue escitalopram, aripiprazole   HTN  Stable-continue amlodipine/Coreg/lisinopril   History of bipolar disorder Continue Abilify.    History of CVA with right hemiparesis/facial droop On anticoagulation   Peripheral neuropathy: Continue Neurontin and as needed oxycodone.    DM-2 (A1c 6.0 on 9/17):  Continue CBGs with SSI   History of right AKA-left great toe amputation   Left medial malleoli ulcer Wound care as per wound care  nurse recommendations.   COPD Not in exacerbation-continue Brovana/Yupelri/budesonide- will convert to inhaler regimen when closer to discharge.   Polysubstance abuse-cocaine Counseled   Homelessness:  Very vague about his situation-claims he sleeps wherever he can-eats whatever he can get-claims he is on a wheelchair (right AKA/right hemiparesis).   Social worker following.  DVT prophylaxis: Eliquis Code Status: Full Family Communication: None at bedside Disposition Plan: Status is: Inpatient Remains inpatient appropriate because: Awaiting safe disposition.  Currently medically stable for discharge.    Consultants: Psychiatry  Procedures: None  Antimicrobials: None currently   Subjective: Patient seen and examined at bedside.  Poor historian.  No chest pain, vomiting, agitation reported.   Objective: Vitals:   09/18/22 1703 09/18/22 2103 09/18/22 2123 09/19/22 0410  BP: 120/83  123/86 (!) 161/80  Pulse: 75  69 68  Resp: 18  17 18   Temp: 98.3 F (36.8 C)  98.4 F (36.9 C) 97.7 F (36.5 C)  TempSrc: Oral  Oral Oral  SpO2: 100% 100% 100% 100%  Weight:      Height:        Intake/Output Summary (Last 24 hours) at 09/19/2022 0722 Last data filed at 09/19/2022 0049 Gross per 24 hour  Intake 1200 ml  Output 400 ml  Net 800 ml    Filed Weights   08/16/22 1543 09/11/22 0502 09/17/22 2133  Weight: 86.2 kg 75.4 kg 75.4 kg    Examination:  General exam: No acute distress.  Still on room air.  Poor historian.  Slow to respond.  Flat affect. Respiratory system: Bilateral decreased breath sounds at bases with some crackles  cardiovascular system: S1-S2 heard; mostly rate controlled gastrointestinal system: Abdomen is mildly distended, soft and nontender.  Bowel sounds are heard normally extremities: Right AKA present.  Left great toe amputation present   Data  Reviewed: I have personally reviewed following labs and imaging studies  CBC: No results for input(s):  "WBC", "NEUTROABS", "HGB", "HCT", "MCV", "PLT" in the last 168 hours. Basic Metabolic Panel: No results for input(s): "NA", "K", "CL", "CO2", "GLUCOSE", "BUN", "CREATININE", "CALCIUM", "MG", "PHOS" in the last 168 hours. GFR: Estimated Creatinine Clearance: 103 mL/min (by C-G formula based on SCr of 0.82 mg/dL). Liver Function Tests: No results for input(s): "AST", "ALT", "ALKPHOS", "BILITOT", "PROT", "ALBUMIN" in the last 168 hours. No results for input(s): "LIPASE", "AMYLASE" in the last 168 hours. No results for input(s): "AMMONIA" in the last 168 hours. Coagulation Profile: No results for input(s): "INR", "PROTIME" in the last 168 hours. Cardiac Enzymes: No results for input(s): "CKTOTAL", "CKMB", "CKMBINDEX", "TROPONINI" in the last 168 hours. BNP (last 3 results) No results for input(s): "PROBNP" in the last 8760 hours. HbA1C: No results for input(s): "HGBA1C" in the last 72 hours. CBG: Recent Labs  Lab 09/17/22 2133 09/18/22 0731 09/18/22 1111 09/18/22 1618 09/18/22 2131  GLUCAP 116* 136* 89 116* 110*    Lipid Profile: No results for input(s): "CHOL", "HDL", "LDLCALC", "TRIG", "CHOLHDL", "LDLDIRECT" in the last 72 hours. Thyroid Function Tests: No results for input(s): "TSH", "T4TOTAL", "FREET4", "T3FREE", "THYROIDAB" in the last 72 hours. Anemia Panel: No results for input(s): "VITAMINB12", "FOLATE", "FERRITIN", "TIBC", "IRON", "RETICCTPCT" in the last 72 hours. Sepsis Labs: No results for input(s): "PROCALCITON", "LATICACIDVEN" in the last 168 hours.  No results found for this or any previous visit (from the past 240 hour(s)).       Radiology Studies: No results found.      Scheduled Meds:  amLODipine  10 mg Oral Daily   apixaban  5 mg Oral BID   arformoterol  15 mcg Nebulization BID   ARIPiprazole  10 mg Oral Daily   budesonide (PULMICORT) nebulizer solution  0.25 mg Nebulization BID   carvedilol  3.125 mg Oral BID WC   escitalopram  5 mg Oral Daily    folic acid  1 mg Oral Daily   furosemide  40 mg Oral BID   gabapentin  400 mg Oral TID   insulin aspart  0-9 Units Subcutaneous TID WC   lisinopril  10 mg Oral Daily   revefenacin  175 mcg Nebulization Daily   thiamine  100 mg Oral Daily   Continuous Infusions:        Aline August, MD [facility] 09/19/2022, 7:22 AM

## 2022-09-20 LAB — GLUCOSE, CAPILLARY
Glucose-Capillary: 119 mg/dL — ABNORMAL HIGH (ref 70–99)
Glucose-Capillary: 90 mg/dL (ref 70–99)
Glucose-Capillary: 185 mg/dL — ABNORMAL HIGH (ref 70–99)
Glucose-Capillary: 96 mg/dL (ref 70–99)

## 2022-09-20 NOTE — Progress Notes (Signed)
PROGRESS NOTE    [name]  BPZ:[id number] DOB: 12/01/1966 DOA: 08/16/2022 PCP: [name], NP   Brief Narrative:   56 y.o.  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  He was subsequently started on anticoagulation and admitted to the hospitalist service.  Limited echo showed EF of less than 20%.  Further hospital course was complicated by patient having suicidal ideation-psychiatry was consulted-bedside sitter was placed-with recommendations to pursue inpatient psychiatric admission.  Later patient was cleared by psychiatry, no indication for inpatient psych admission, social worker assisting with SNF placement currently medically stable for discharge.  Assessment & Plan:  Pulmonary embolism with right lung infarct:  Etiology likely due to noncompliance Indefinite anticoagulation-given prior episode of VTE   Chronic HFpEF/HFrEF:  Euvolemic-continue Lasix/Coreg/lisinopril.  Continue strict input and output, daily weights, fluid restriction. Outpatient cardiology follow-up-if he is compliant with medication-could be switched to Entresto    Suicidal ideation: Appreciate psych input - now stable, cleared by psych.  Outpatient follow-up with psychiatry.  Continue escitalopram, aripiprazole   HTN  Stable-continue amlodipine/Coreg/lisinopril/Lasix   History of bipolar disorder Continue Abilify.    History of CVA with right hemiparesis/facial droop On anticoagulation   Peripheral neuropathy: Continue Neurontin and as needed oxycodone.    DM-2 (A1c 6.0 on 9/17):  Continue CBGs with SSI   History of right AKA-left great toe amputation   Left medial malleoli ulcer Wound care as per wound  care nurse recommendations.   COPD Not in exacerbation-continue Brovana/Yupelri/budesonide- will convert to inhaler regimen when closer to discharge.   Polysubstance abuse-cocaine Counseled   Homelessness:  Very vague about his situation-claims he sleeps wherever he can-eats whatever he can get-claims he is on a wheelchair (right AKA/right hemiparesis).   Social worker following.  DVT prophylaxis: Eliquis Code Status: Full Family Communication: None at bedside Disposition Plan: Status is: Inpatient Remains inpatient appropriate because: Awaiting safe disposition.  Currently medically stable for discharge.    Consultants: Psychiatry  Procedures: None  Antimicrobials: None currently   Subjective: Patient seen and examined at bedside.  Poor historian.  No new complaints.  No fever or vomiting reported.   Objective: Vitals:   09/19/22 1702 09/19/22 2009 09/19/22 2129 09/20/22 0526  BP: (!) 148/82 129/79  134/76  Pulse: 88 65  83  Resp: 16 18  18   Temp: 98.1 F (36.7 C) 98.1 F (36.7 C)  98.1 F (36.7 C)  TempSrc: Oral Oral  Oral  SpO2: 95% 100% 100% 100%  Weight:      Height:        Intake/Output Summary (Last 24 hours) at 09/20/2022 0750 Last data filed at 09/20/2022 0601 Gross per 24 hour  Intake 240 ml  Output 600 ml  Net -360 ml    Filed Weights   08/16/22 1543 09/11/22 0502 09/17/22 2133  Weight: 86.2 kg 75.4 kg 75.4 kg    Examination:  General exam: On room air.  No distress.  Poor historian.  Slow to respond.  Flat affect. Respiratory system: Decreased breath sounds at bases bilaterally with scattered crackles  cardiovascular system: Rate controlled; S1-S2 heard  gastrointestinal system: Abdomen is still distended, soft and nontender.  Normal bowel sounds are heard extremities: Right AKA present.  Left great toe amputation present  Data Reviewed: I have personally reviewed following labs and imaging studies  CBC: No results for input(s):  "WBC", "NEUTROABS", "HGB", "HCT", "MCV", "PLT" in the last 168 hours. Basic Metabolic Panel: No results for input(s): "NA", "K", "CL", "CO2", "GLUCOSE", "BUN", "CREATININE", "CALCIUM", "MG", "PHOS" in the last 168 hours. GFR: Estimated Creatinine Clearance: 103 mL/min (by C-G formula based on SCr of 0.82 mg/dL). Liver Function Tests: No results for input(s): "AST", "ALT", "ALKPHOS", "BILITOT", "PROT", "ALBUMIN" in the last 168 hours. No results for input(s): "LIPASE", "AMYLASE" in the last 168 hours. No results for input(s): "AMMONIA" in the last 168 hours. Coagulation Profile: No results for input(s): "INR", "PROTIME" in the last 168 hours. Cardiac Enzymes: No results for input(s): "CKTOTAL", "CKMB", "CKMBINDEX", "TROPONINI" in the last 168 hours. BNP (last 3 results) No results for input(s): "PROBNP" in the last 8760 hours. HbA1C: No results for input(s): "HGBA1C" in the last 72 hours. CBG: Recent Labs  Lab 09/19/22 0742 09/19/22 1129 09/19/22 1700 09/19/22 2008 09/20/22 0722  GLUCAP 144* 140* 98 100* 119*    Lipid Profile: No results for input(s): "CHOL", "HDL", "LDLCALC", "TRIG", "CHOLHDL", "LDLDIRECT" in the last 72 hours. Thyroid Function Tests: No results for input(s): "TSH", "T4TOTAL", "FREET4", "T3FREE", "THYROIDAB" in the last 72 hours. Anemia Panel: No results for input(s): "VITAMINB12", "FOLATE", "FERRITIN", "TIBC", "IRON", "RETICCTPCT" in the last 72 hours. Sepsis Labs: No results for input(s): "PROCALCITON", "LATICACIDVEN" in the last 168 hours.  No results found for this or any previous visit (from the past 240 hour(s)).       Radiology Studies: No results found.      Scheduled Meds:  amLODipine  10 mg Oral Daily   apixaban  5 mg Oral BID   arformoterol  15 mcg Nebulization BID   ARIPiprazole  10 mg Oral Daily   budesonide (PULMICORT) nebulizer solution  0.25 mg Nebulization BID   carvedilol  3.125 mg Oral BID WC   escitalopram  5 mg Oral Daily    folic acid  1 mg Oral Daily   furosemide  40 mg Oral BID   gabapentin  400 mg Oral TID   insulin aspart  0-9 Units Subcutaneous TID WC   lisinopril  10 mg Oral Daily   revefenacin  175 mcg Nebulization Daily   thiamine  100 mg Oral Daily   Continuous Infusions:        Aline August, MD [facility] 09/20/2022, 7:50 AM

## 2022-09-20 NOTE — Progress Notes (Signed)
Mobility Specialist Progress Note:   09/20/22 1400  Mobility  Activity Transferred from bed to chair (self-propel in w/c)  Level of Assistance Standby assist, set-up cues, supervision of patient - no hands on  Assistive Device Wheelchair  Distance Ambulated (ft) 1000 ft (self-propel in w/c)  Activity Response Tolerated well  Mobility Referral Yes  $Mobility charge 1 Mobility   Pt eager for mobility session. No c/o throughout. Pt back in room with all needs met.   Nelta Numbers Acute Rehab Secure Chat or Office Phone: [phone]

## 2022-09-21 LAB — GLUCOSE, CAPILLARY
Glucose-Capillary: 104 mg/dL — ABNORMAL HIGH (ref 70–99)
Glucose-Capillary: 119 mg/dL — ABNORMAL HIGH (ref 70–99)
Glucose-Capillary: 149 mg/dL — ABNORMAL HIGH (ref 70–99)
Glucose-Capillary: 129 mg/dL — ABNORMAL HIGH (ref 70–99)

## 2022-09-21 NOTE — Progress Notes (Signed)
PROGRESS NOTE    [name]  SHF:[id number] DOB: [birth year] DOA: 08/16/2022 PCP: Marliss Coots, NP   Brief Narrative:   56 y.o.  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  He was subsequently started on anticoagulation and admitted to the hospitalist service.  Limited echo showed EF of less than 20%.  Further hospital course was complicated by patient having suicidal ideation-psychiatry was consulted-bedside sitter was placed-with recommendations to pursue inpatient psychiatric admission.  Later patient was cleared by psychiatry, no indication for inpatient psych admission, social worker assisting with SNF placement currently medically stable for discharge.  Assessment & Plan:  Pulmonary embolism with right lung infarct:  Etiology likely due to noncompliance Indefinite anticoagulation-given prior episode of VTE   Chronic HFpEF/HFrEF:  Euvolemic-continue Lasix/Coreg/lisinopril.  Continue strict input and output, daily weights, fluid restriction. Outpatient cardiology follow-up-if he is compliant with medication-could be switched to Entresto    Suicidal ideation: Appreciate psych input - now stable, cleared by psych.  Outpatient follow-up with psychiatry.  Continue escitalopram, aripiprazole   HTN  Stable-continue amlodipine/Coreg/lisinopril/Lasix   History of bipolar disorder Continue Abilify.    History of CVA with right hemiparesis/facial droop On anticoagulation   Peripheral neuropathy: Continue Neurontin and as needed oxycodone.    DM-2 (A1c 6.0 on 9/17):  Continue CBGs with SSI   History of right AKA-left great toe amputation   Left medial malleoli ulcer Wound care as per wound  care nurse recommendations.   COPD Not in exacerbation-continue Brovana/Yupelri/budesonide- will convert to inhaler regimen when closer to discharge.   Polysubstance abuse-cocaine Counseled   Homelessness:  Very vague about his situation-claims he sleeps wherever he can-eats whatever he can get-claims he is on a wheelchair (right AKA/right hemiparesis).   Social worker following.  DVT prophylaxis: Eliquis Code Status: Full Family Communication: None at bedside Disposition Plan: Status is: Inpatient Remains inpatient appropriate because: Awaiting safe disposition.  Currently medically stable for discharge.    Consultants: Psychiatry  Procedures: None  Antimicrobials: None currently   Subjective: Patient seen and examined at bedside.  Poor historian.  No chest pain, vomiting, fever or shortness of breath reported.   Objective: Vitals:   09/20/22 0917 09/20/22 1702 09/20/22 2118 09/21/22 0459  BP:  (!) 132/91 (!) 156/89 (!) 131/95  Pulse: 70 81 63 72  Resp: 18 18 18 18   Temp:  99 F (37.2 C) 98.5 F (36.9 C) 98.1 F (36.7 C)  TempSrc:  Oral  Oral  SpO2: 98% 100% 100% 100%  Weight:      Height:        Intake/Output Summary (Last 24 hours) at 09/21/2022 0818 Last data filed at 09/21/2022 0459 Gross per 24 hour  Intake 800 ml  Output 300 ml  Net 500 ml    Filed Weights   08/16/22 1543 09/11/22 0502 09/17/22 2133  Weight: 86.2 kg 75.4 kg 75.4 kg    Examination:  General exam: No acute distress.  Still on room air.  Poor historian.  Slow to respond.  Flat affect. Respiratory system: Bilateral decreased breath sounds at bases with some crackles  cardiovascular system: S1 and S2 are heard; rate controlled currently  gastrointestinal system: Abdomen has some mild distention; soft and nontender.  Bowel sounds heard normally  extremities: Right  AKA present.  Left great toe amputation present   Data Reviewed: I have personally reviewed following labs and imaging  studies  CBC: No results for input(s): "WBC", "NEUTROABS", "HGB", "HCT", "MCV", "PLT" in the last 168 hours. Basic Metabolic Panel: No results for input(s): "NA", "K", "CL", "CO2", "GLUCOSE", "BUN", "CREATININE", "CALCIUM", "MG", "PHOS" in the last 168 hours. GFR: Estimated Creatinine Clearance: 103 mL/min (by C-G formula based on SCr of 0.82 mg/dL). Liver Function Tests: No results for input(s): "AST", "ALT", "ALKPHOS", "BILITOT", "PROT", "ALBUMIN" in the last 168 hours. No results for input(s): "LIPASE", "AMYLASE" in the last 168 hours. No results for input(s): "AMMONIA" in the last 168 hours. Coagulation Profile: No results for input(s): "INR", "PROTIME" in the last 168 hours. Cardiac Enzymes: No results for input(s): "CKTOTAL", "CKMB", "CKMBINDEX", "TROPONINI" in the last 168 hours. BNP (last 3 results) No results for input(s): "PROBNP" in the last 8760 hours. HbA1C: No results for input(s): "HGBA1C" in the last 72 hours. CBG: Recent Labs  Lab 09/20/22 0722 09/20/22 1121 09/20/22 1703 09/20/22 2118 09/21/22 0727  GLUCAP 119* 90 185* 96 104*    Lipid Profile: No results for input(s): "CHOL", "HDL", "LDLCALC", "TRIG", "CHOLHDL", "LDLDIRECT" in the last 72 hours. Thyroid Function Tests: No results for input(s): "TSH", "T4TOTAL", "FREET4", "T3FREE", "THYROIDAB" in the last 72 hours. Anemia Panel: No results for input(s): "VITAMINB12", "FOLATE", "FERRITIN", "TIBC", "IRON", "RETICCTPCT" in the last 72 hours. Sepsis Labs: No results for input(s): "PROCALCITON", "LATICACIDVEN" in the last 168 hours.  No results found for this or any previous visit (from the past 240 hour(s)).       Radiology Studies: No results found.      Scheduled Meds:  amLODipine  10 mg Oral Daily   apixaban  5 mg Oral BID   arformoterol  15 mcg Nebulization BID   ARIPiprazole  10 mg Oral Daily   budesonide (PULMICORT) nebulizer solution  0.25 mg Nebulization BID   carvedilol  3.125 mg Oral  BID WC   escitalopram  5 mg Oral Daily   folic acid  1 mg Oral Daily   furosemide  40 mg Oral BID   gabapentin  400 mg Oral TID   insulin aspart  0-9 Units Subcutaneous TID WC   lisinopril  10 mg Oral Daily   revefenacin  175 mcg Nebulization Daily   thiamine  100 mg Oral Daily   Continuous Infusions:        Glade Lloyd, MD [facility] 09/21/2022, 8:18 AM

## 2022-09-21 NOTE — Progress Notes (Signed)
Mobility Specialist Progress Note:   09/21/22 0900  Mobility  Activity Ambulated with assistance to bathroom;Ambulated independently to bathroom;Transferred from chair to bed  Level of Assistance Standby assist, set-up cues, supervision of patient - no hands on  Assistive Device Wheelchair  Distance Ambulated (ft) 30 ft  Activity Response Tolerated well  Mobility Referral Yes  $Mobility charge 1 Mobility   Pt eager for mobility session. Performed transfer to w/c, then toilet transfer and back to bed. Will f/u for further mobility this afternoon.   Nelta Numbers Acute Rehab Secure Chat or Office Phone: [phone]

## 2022-09-22 LAB — GLUCOSE, CAPILLARY
Glucose-Capillary: 124 mg/dL — ABNORMAL HIGH (ref 70–99)
Glucose-Capillary: 105 mg/dL — ABNORMAL HIGH (ref 70–99)
Glucose-Capillary: 120 mg/dL — ABNORMAL HIGH (ref 70–99)
Glucose-Capillary: 141 mg/dL — ABNORMAL HIGH (ref 70–99)

## 2022-09-22 NOTE — Progress Notes (Signed)
PROGRESS NOTE    [name]  VOZ:[id number] DOB: Apr 14, 1966 DOA: 08/16/2022 PCP: [name], NP   Brief Narrative:  This 56 y.o.  male with history of chronic HFpEF, prior CVA with resultant right hemiparesis/facial droop, right AKA/left great toe amputation, VTE noncompliant with anticoagulation, DM-2, polysubstance abuse, bipolar disorder-who presented to the hospital with chest pain/shortness of breath-he was found to have bilateral PE with pulmonary infarct.  He was subsequently started on anticoagulation and admitted to the hospitalist service.  Limited echo showed EF of less than 20%.  Further hospital course was complicated by patient having suicidal ideations-psychiatry was consulted-bedside sitter was placed-with recommendations to pursue inpatient psychiatric admission.  Later patient was cleared by psychiatry, no indication for inpatient psych admission, social worker assisting with SNF placement, currently medically stable for discharge.  Assessment & Plan:   Principal Problem:   Pulmonary emboli (HCC) Active Problems:   Cocaine use disorder, severe, dependence (HCC)   Cocaine abuse with cocaine-induced mood disorder (HCC)   Homeless   Suicidal ideation   Essential hypertension   Bipolar I disorder (HCC)   Chronic combined systolic and diastolic congestive heart failure (HCC)  Pulmonary embolism with right lung infarct: Etiology likely due to noncompliance. Plan: Indefinite anticoagulation-given prior episode of VTE.   Chronic HFpEF/HFrEF:  Appears euvolemic Continue Lasix/Coreg/lisinopril.   Continue strict input and output, daily weights, fluid restriction. Outpatient cardiology follow-up-if he is compliant with medication-could be switched to [facility].   Suicidal ideations: Appreciate psych input - now stable, cleared by psych.   Outpatient follow-up with psychiatry.   Continue escitalopram, aripiprazole.   Essential hypertension: Continue  amlodipine/Coreg/lisinopril/Lasix. Blood pressure remains stable.   History of bipolar disorder Continue Abilify 10 mg daily.   History of CVA with right hemiparesis/facial droop Continue Eliquis 5 mg twice daily   Peripheral neuropathy: Continue Neurontin and as needed oxycodone.    DM-2 (A1c 6.0 on 9/17):  Continue CBGs with SSI.  History of right AKA-left great toe amputation:   Left medial malleoli ulcer Wound care as per wound care nurse recommendations.   COPD: Not in exacerbation Continue Brovana/Yupelri/budesonide-  will convert to inhaler regimen when closer to discharge.   Polysubstance abuse-cocaine Counseled.    Homelessness:  Difficult situation.  Claims he sleeps wherever he can-eats whatever he can get-claims he is on a wheelchair (right AKA/right hemiparesis).  Social worker following.   DVT prophylaxis: Eliquis Code Status: Full code Family Communication: No family at bedside Disposition Plan:   Status is: Inpatient Remains inpatient appropriate because: Awaiting safe disposition.  Currently medically stable for discharge.   Consultants:  Psychiatry  Procedures: None Antimicrobials: None.  Subjective: Patient was seen and examined at bedside.  No overnight events noted.   Patient denies any chest pain, vomiting.  He was sitting on the wheelchair,   He states he does arounds inside the hallway.  Objective: Vitals:   09/21/22 2014 09/21/22 2043 09/22/22 0608 09/22/22 0943  BP:  129/82 125/79 106/77  Pulse:  67 70 80  Resp:  18 18 17   Temp:  98.2 F (36.8 C) 98.5 F (36.9 C) 97.8 F (36.6 C)  TempSrc:  Oral Oral Oral  SpO2: 96% 96% 99% 97%  Weight:      Height:        Intake/Output Summary (Last 24 hours) at 09/22/2022 1125 Last data filed at 09/22/2022 0608 Gross per 24 hour  Intake 580 ml  Output 500 ml  Net 80 ml   Filed  Weights   08/16/22 1543 09/11/22 0502 09/17/22 2133  Weight: 86.2 kg 75.4 kg 75.4 kg    Examination:  General exam: Appears comfortable, not in any acute distress, slow to respond. Respiratory system: CTA bilaterally, no wheezing, no crackles, normal respiratory effort. Cardiovascular system: S1 & S2 heard, regular rate and rhythm, no murmur. Gastrointestinal system: Abdomen is soft, non tender, non distended, BS+ Central nervous system: Alert and oriented x 3. No focal neurological deficits. Extremities: Right AKA, left great toe amputation.  Skin: No rashes, lesions or ulcers Psychiatry: Judgement and insight appear normal. Mood & affect appropriate.   Data Reviewed: I have personally reviewed following labs and imaging studies  CBC: No results for input(s): "WBC", "NEUTROABS", "HGB", "HCT", "MCV", "PLT" in the last 168 hours. Basic Metabolic Panel: No results for input(s): "NA", "K", "CL", "CO2", "GLUCOSE", "BUN", "CREATININE", "CALCIUM", "MG", "PHOS" in the last 168 hours. GFR: Estimated Creatinine Clearance: 103 mL/min (by C-G formula based on SCr of 0.82 mg/dL). Liver Function Tests: No results for input(s): "AST", "ALT", "ALKPHOS", "BILITOT", "PROT", "ALBUMIN" in the last 168 hours. No results for input(s): "LIPASE", "AMYLASE" in the last 168 hours. No results for input(s): "AMMONIA" in the last 168 hours. Coagulation Profile: No results for input(s): "INR", "PROTIME" in the last 168 hours. Cardiac Enzymes: No results for input(s): "CKTOTAL", "CKMB", "CKMBINDEX", "TROPONINI" in the last 168 hours. BNP (last 3 results) No results for input(s): "PROBNP" in the last 8760 hours. HbA1C: No results for input(s): "HGBA1C" in the last 72 hours. CBG: Recent Labs  Lab 09/21/22 0727 09/21/22 1117 09/21/22 1629 09/21/22 2043 09/22/22 0723  GLUCAP 104* 119* 149* 129* 124*   Lipid Profile: No results for input(s): "CHOL", "HDL", "LDLCALC", "TRIG", "CHOLHDL", "LDLDIRECT" in the last 72 hours. Thyroid Function Tests: No results for input(s): "TSH", "T4TOTAL",  "FREET4", "T3FREE", "THYROIDAB" in the last 72 hours. Anemia Panel: No results for input(s): "VITAMINB12", "FOLATE", "FERRITIN", "TIBC", "IRON", "RETICCTPCT" in the last 72 hours. Sepsis Labs: No results for input(s): "PROCALCITON", "LATICACIDVEN" in the last 168 hours.  No results found for this or any previous visit (from the past 240 hour(s)).   Radiology Studies: No results found.  Scheduled Meds:  amLODipine  10 mg Oral Daily   apixaban  5 mg Oral BID   arformoterol  15 mcg Nebulization BID   ARIPiprazole  10 mg Oral Daily   budesonide (PULMICORT) nebulizer solution  0.25 mg Nebulization BID   carvedilol  3.125 mg Oral BID WC   escitalopram  5 mg Oral Daily   folic acid  1 mg Oral Daily   furosemide  40 mg Oral BID   gabapentin  400 mg Oral TID   insulin aspart  0-9 Units Subcutaneous TID WC   lisinopril  10 mg Oral Daily   revefenacin  175 mcg Nebulization Daily   thiamine  100 mg Oral Daily   Continuous Infusions:   LOS: 36 days    Time spent: 50 mins  Artha Chiasson, MD [facility]   If 7PM-7AM, please contact night-coverage

## 2022-09-22 NOTE — Progress Notes (Signed)
Dr. Bridgett Larsson was informed that during report the day RN stated that Dr. Rubye Oaks wrote in the chat that he would increase gabapentin however no note so Dr Bridgett Larsson wrote via chat to be address during rounds in am. Arthor Captain LPN

## 2022-09-22 NOTE — TOC Progression Note (Signed)
Transition of Care [facility]) - Initial/Assessment Note    Patient Details  Name: [name] MRN: [id number] Date of Birth: 1966-05-26  Transition of [facility] CM/SW Contact:    Ralene Bathe, LCSWA Phone Number: 09/22/2022, 3:17 PM  Clinical Narrative:                 [name] contacted Amber at [phone].  The patient reported that this is the case manager working to get housing.  [name] spoke with [facility] who reported that she does not assist with locating housing.  Amber works with the [employer] and is a part of an Administrator for individuals with severe mental illness.    CSW contacted the patient's DSS worker, Maxie Better at [phone].  There was no answer. [name] left a secure VM requesting a returned call.      Expected Discharge Plan: Skilled Nursing Facility Barriers to Discharge: Awaiting State Approval (PASRR), Insurance Authorization, SNF Pending bed offer, Active Substance Use - Placement, Homeless with medical needs   Patient Goals and CMS Choice Patient states their goals for this hospitalization and ongoing recovery are:: Rehab CMS Medicare.gov Compare Post Acute Care list provided to:: Patient Choice offered to / list presented to : Patient  Expected Discharge Plan and Services Expected Discharge Plan: Skilled Nursing Facility In-house Referral: Clinical Social Work   Post Acute Care Choice: Skilled Nursing Facility Living arrangements for the past 2 months: Homeless                                      Prior Living Arrangements/Services Living arrangements for the past 2 months: Homeless Lives with:: Self Patient language and need for interpreter reviewed:: Yes Do you feel safe going back to the place where you live?: Yes      Need for Family Participation in Patient Care: No (Comment) Care giver support system in place?: No (comment) 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) Criminal Activity/Legal Involvement Pertinent to Current  Situation/Hospitalization: No - Comment as needed  Activities of Daily Living      Permission Sought/Granted                  Emotional Assessment Appearance:: Appears stated age     Orientation: : Oriented to Self, Oriented to Place, Oriented to  Time, Oriented to Situation Alcohol / Substance Use: Illicit Drugs Psych Involvement: Yes (comment)  Admission diagnosis:  Pulmonary emboli (HCC) [I26.99] Acute pulmonary embolism, unspecified pulmonary embolism type, unspecified whether acute cor pulmonale present (HCC) [I26.99] Patient Active Problem List   Diagnosis Date Noted   Pulmonary emboli (HCC) 08/17/2022   Venous ulcer (HCC) 07/23/2022   Sinus bradycardia 07/23/2022   Anemia of chronic disease 07/23/2022   S/P AKA (above knee amputation), right (HCC) 07/23/2022   Acute on chronic systolic congestive heart failure (HCC) 07/12/2022   History of DVT (deep vein thrombosis) 07/12/2022   CAP (community acquired pneumonia) 07/12/2022   Chronic venous hypertension (idiopathic) with ulcer and inflammation of bilateral lower extremity (HCC)    Sepsis due to cellulitis (HCC) 04/04/2022   Normocytic anemia 04/04/2022   Acute deep vein thrombosis (DVT) of brachial vein (HCC) 03/31/2022   Swelling of lower extremity 03/23/2022   SIRS (systemic inflammatory response syndrome) (HCC) 03/23/2022   Leg swelling 03/23/2022   Acute on chronic systolic heart failure (HCC) 02/26/2022   Seizure (HCC) 02/26/2022   Venous stasis ulcer of right ankle  limited to breakdown of skin with varicose veins (HCC)    Hypertensive urgency 01/29/2022   Prolonged QT interval 01/29/2022   Alcohol abuse 01/29/2022   chronic ulcers 01/29/2022   Wound infection 12/04/2021   Open wounds involving multiple regions of lower extremity 12/03/2021   Mixed diabetic hyperlipidemia associated with type 2 diabetes mellitus ([place]) 12/03/2021   Cellulitis of right lower extremity 11/13/2021   Type 2 diabetes mellitus  with diabetic polyneuropathy, without long-term current use of insulin ([place]) 11/13/2021   Antisocial personality disorder ([place]) 11/04/2021   Malingering 11/04/2021   Foot pain 04/23/2021   Chronic combined systolic and diastolic congestive heart failure ([place]) 03/02/2021   History of stroke 03/02/2021   Polysubstance abuse ([place]) 03/02/2021   Skin ulcer of right great toe with necrosis of bone ([place])    Encounter for assessment of healthcare decision-making capacity    Pain, dental    Dystonia    Dysarthria    Shock liver 44/31/5400   Acute metabolic encephalopathy 86/76/1950   Coronary artery disease involving native coronary artery of native heart without angina pectoris 01/04/2021   Essential hypertension 01/04/2021   Bipolar I disorder ([place]) 01/04/2021   Cocaine abuse ([place]) 01/04/2021   Homelessness 01/04/2021   HLD (hyperlipidemia) 01/04/2021   Osteomyelitis of great toe (HCC)    Ulcer of great toe, left, with necrosis of bone (HCC)    Ulcer of great toe, right, with necrosis of muscle (HCC)    Acute osteomyelitis of toe, left (HCC)    Acute on chronic combined systolic and diastolic CHF (congestive heart failure) ([place]) 12/20/2020   Respiratory failure ([place])    Ischemic stroke ([place]) 12/17/2020   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   Diabetic foot ulcer ([place]) 12/08/2020   CHF (congestive heart failure) ([place]) 11/14/2020   CHF exacerbation ([place]) 11/13/2020   Hypokalemia    Hyperlipidemia    Acute hypoxemic respiratory failure ([place]) 10/26/2020   COPD with acute exacerbation ([place]) 10/14/2020   Acute on chronic combined systolic and diastolic CHF (congestive heart failure) ([place]) 10/02/2020   Heart failure with reduced ejection fraction and diastolic dysfunction ([place]) 09/07/2020   Near syncope 07/21/2020   Acid reflux 06/03/2020   Cannabis abuse 03/19/2020   Acute bronchitis due to Rhinovirus 09/08/2019   Schizoaffective disorder ([place]) 06/24/2019   SOB (shortness of breath)    QT  prolongation 05/25/2019   Cocaine abuse ([place])    Elevated troponin    Homeless    Suicidal ideation    Bilateral lower extremity pain 04/05/2019   Housing situation unstable 03/28/2019   Bilateral lower extremity edema    Acute exacerbation of CHF (congestive heart failure) ([place]) 03/04/2019   Alcohol withdrawal syndrome, with delirium ([place]) 05/03/2018   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   Mild intermittent asthma without complication 93/26/7124   Asthma 12/26/2016   Tobacco use disorder 12/26/2016   Schizophrenia ([place]) 12/26/2016   Severe episode of recurrent major depressive disorder, with psychotic features ([place]) 10/31/2016   Cocaine abuse with cocaine-induced mood disorder ([place]) 09/06/2016   Alcohol use disorder, mild, abuse 07/31/2016   Substance induced mood disorder ([place]) 07/30/2016   Schizoaffective disorder, bipolar type ([place]) 05/31/2016   Cocaine use disorder, severe, dependence ([place]) 05/31/2016   Major depressive disorder, recurrent episode ([place]) 05/30/2016   Suicidal behavior with attempted self-injury ([place]) 05/08/2016   Acetaminophen overdose 05/06/2016   PCP:  Marliss Coots, NP Pharmacy:   [facility] [place]  [address] [name] [address] Phone: [phone] Fax: [phone]  [facility] [address] [name] [address] Phone: [phone] Fax: [phone]     Social Determinants of Health (SDOH) Interventions    Readmission Risk Interventions    08/18/2022    9:00 AM 07/18/2022    4:11 PM 04/08/2022    1:43 PM  Readmission Risk Prevention Plan  Transportation Screening Complete Complete Complete  Medication Review (RN Care Manager) Complete Complete Complete  PCP or Specialist appointment within 3-5 days of discharge Complete Complete Complete  HRI or Home Care Consult Complete Complete Not Complete  HRI or Home Care  Consult Pt Refusal Comments   Pt is homeless  SW Recovery Care/Counseling Consult Complete Complete Complete  Palliative Care Screening Not Applicable  Not Applicable  Skilled Nursing Facility Not Applicable Complete Not Applicable

## 2022-09-22 NOTE — Progress Notes (Signed)
Mobility Specialist Progress Note:   09/22/22 0900  Mobility  Activity Transferred from bed to chair  Level of Assistance Modified independent, requires aide device or extra time  Assistive Device Wheelchair  Distance Ambulated (ft) 500 ft (self-propel)  Activity Response Tolerated well  Mobility Referral Yes  $Mobility charge 1 Mobility   Pt received in hallway self-propelling in w/c. No c/o throughout. Pt inquiring about new w/c. Left with all needs met.   Nelta Numbers Acute Rehab Secure Chat or Office Phone: [phone]

## 2022-09-23 LAB — GLUCOSE, CAPILLARY
Glucose-Capillary: 105 mg/dL — ABNORMAL HIGH (ref 70–99)
Glucose-Capillary: 89 mg/dL (ref 70–99)
Glucose-Capillary: 114 mg/dL — ABNORMAL HIGH (ref 70–99)
Glucose-Capillary: 100 mg/dL — ABNORMAL HIGH (ref 70–99)

## 2022-09-23 NOTE — Progress Notes (Signed)
Mobility Specialist Progress Note:   09/23/22 0855  Mobility  Activity Transferred from bed to chair  Level of Assistance Modified independent, requires aide device or extra time  Assistive Device Wheelchair  Distance Ambulated (ft) 500 ft (self-propel in w/c)  Activity Response Tolerated well  Mobility Referral Yes  $Mobility charge 1 Mobility   Pt received self-propelling in w/c in hallway. No physical assistance required. Pt back in room with all needs met.   Nelta Numbers Acute Rehab Secure Chat or Office Phone: [phone]

## 2022-09-23 NOTE — TOC Progression Note (Addendum)
Transition of Care [facility]) - Initial/Assessment Note    Patient Details  Name: [name] MRN: [id number] Date of Birth: [birth year]  Transition of Care [facility]) CM/SW Contact:    Ralene Bathe, LCSWA Phone Number: 09/23/2022, 11:08 AM  Clinical Narrative:                 [name] contacted the patient's DSS worker, Maxie Better at [phone].  There was no answer. [name] left a secure VM requesting a returned call.    [name] also contacted Shelton Silvas at the [facility].  There was no answer.  [name] left a VM requesting a returned call.   12:30- CSW received a returned call from Ms. Dalene Carrow with [facility] and was informed that the department no longer has an open case. The patient is not assigned a DSS worker.   TOC will continue to follow.    Expected Discharge Plan: Skilled Nursing Facility Barriers to Discharge: Awaiting State Approval (PASRR), Insurance Authorization, SNF Pending bed offer, Active Substance Use - Placement, Homeless with medical needs   Patient Goals and CMS Choice Patient states their goals for this hospitalization and ongoing recovery are:: Rehab CMS Medicare.gov Compare Post Acute Care list provided to:: Patient Choice offered to / list presented to : Patient  Expected Discharge Plan and Services Expected Discharge Plan: Skilled Nursing Facility In-house Referral: Clinical Social Work   Post Acute Care Choice: Skilled Nursing Facility Living arrangements for the past 2 months: Homeless                                      Prior Living Arrangements/Services Living arrangements for the past 2 months: Homeless Lives with:: Self Patient language and need for interpreter reviewed:: Yes Do you feel safe going back to the place where you live?: Yes      Need for Family Participation in Patient Care: No (Comment) Care giver support system in place?: No (comment) 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) Criminal Activity/Legal Involvement  Pertinent to Current Situation/Hospitalization: No - Comment as needed  Activities of Daily Living      Permission Sought/Granted                  Emotional Assessment Appearance:: Appears stated age     Orientation: : Oriented to Self, Oriented to Place, Oriented to  Time, Oriented to Situation Alcohol / Substance Use: Illicit Drugs Psych Involvement: Yes (comment)  Admission diagnosis:  Pulmonary emboli (HCC) [I26.99] Acute pulmonary embolism, unspecified pulmonary embolism type, unspecified whether acute cor pulmonale present (HCC) [I26.99] Patient Active Problem List   Diagnosis Date Noted   Pulmonary emboli (HCC) 08/17/2022   Venous ulcer (HCC) 07/23/2022   Sinus bradycardia 07/23/2022   Anemia of chronic disease 07/23/2022   S/P AKA (above knee amputation), right (HCC) 07/23/2022   Acute on chronic systolic congestive heart failure (HCC) 07/12/2022   History of DVT (deep vein thrombosis) 07/12/2022   CAP (community acquired pneumonia) 07/12/2022   Chronic venous hypertension (idiopathic) with ulcer and inflammation of bilateral lower extremity (HCC)    Sepsis due to cellulitis (HCC) 04/04/2022   Normocytic anemia 04/04/2022   Acute deep vein thrombosis (DVT) of brachial vein (HCC) 03/31/2022   Swelling of lower extremity 03/23/2022   SIRS (systemic inflammatory response syndrome) (HCC) 03/23/2022   Leg swelling 03/23/2022   Acute on chronic systolic heart failure (HCC) 02/26/2022   Seizure (HCC)  02/26/2022   Venous stasis ulcer of right ankle limited to breakdown of skin with varicose veins (HCC)    Hypertensive urgency 01/29/2022   Prolonged QT interval 01/29/2022   Alcohol abuse 01/29/2022   chronic ulcers 01/29/2022   Wound infection 12/04/2021   Open wounds involving multiple regions of lower extremity 12/03/2021   Mixed diabetic hyperlipidemia associated with type 2 diabetes mellitus ([place]) 12/03/2021   Cellulitis of right lower extremity 11/13/2021    Type 2 diabetes mellitus with diabetic polyneuropathy, without long-term current use of insulin ([place]) 11/13/2021   Antisocial personality disorder ([place]) 11/04/2021   Malingering 11/04/2021   Foot pain 04/23/2021   Chronic combined systolic and diastolic congestive heart failure ([place]) 03/02/2021   History of stroke 03/02/2021   Polysubstance abuse ([place]) 03/02/2021   Skin ulcer of right great toe with necrosis of bone ([place])    Encounter for assessment of healthcare decision-making capacity    Pain, dental    Dystonia    Dysarthria    Shock liver 16/09/9603   Acute metabolic encephalopathy 54/08/8118   Coronary artery disease involving native coronary artery of native heart without angina pectoris 01/04/2021   Essential hypertension 01/04/2021   Bipolar I disorder ([place]) 01/04/2021   Cocaine abuse ([place]) 01/04/2021   Homelessness 01/04/2021   HLD (hyperlipidemia) 01/04/2021   Osteomyelitis of great toe (HCC)    Ulcer of great toe, left, with necrosis of bone (HCC)    Ulcer of great toe, right, with necrosis of muscle (HCC)    Acute osteomyelitis of toe, left (HCC)    Acute on chronic combined systolic and diastolic CHF (congestive heart failure) ([place]) 12/20/2020   Respiratory failure ([place])    Ischemic stroke ([place]) 12/17/2020   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   Diabetic foot ulcer ([place]) 12/08/2020   CHF (congestive heart failure) ([place]) 11/14/2020   CHF exacerbation ([place]) 11/13/2020   Hypokalemia    Hyperlipidemia    Acute hypoxemic respiratory failure ([place]) 10/26/2020   COPD with acute exacerbation ([place]) 10/14/2020   Acute on chronic combined systolic and diastolic CHF (congestive heart failure) ([place]) 10/02/2020   Heart failure with reduced ejection fraction and diastolic dysfunction ([place]) 09/07/2020   Near syncope 07/21/2020   Acid reflux 06/03/2020   Cannabis abuse 03/19/2020   Acute bronchitis due to Rhinovirus 09/08/2019   Schizoaffective disorder ([place]) 06/24/2019   SOB  (shortness of breath)    QT prolongation 05/25/2019   Cocaine abuse ([place])    Elevated troponin    Homeless    Suicidal ideation    Bilateral lower extremity pain 04/05/2019   Housing situation unstable 03/28/2019   Bilateral lower extremity edema    Acute exacerbation of CHF (congestive heart failure) ([place]) 03/04/2019   Alcohol withdrawal syndrome, with delirium ([place]) 05/03/2018   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   Mild intermittent asthma without complication 14/78/2956   Asthma 12/26/2016   Tobacco use disorder 12/26/2016   Schizophrenia ([place]) 12/26/2016   Severe episode of recurrent major depressive disorder, with psychotic features ([place]) 10/31/2016   Cocaine abuse with cocaine-induced mood disorder ([place]) 09/06/2016   Alcohol use disorder, mild, abuse 07/31/2016   Substance induced mood disorder ([place]) 07/30/2016   Schizoaffective disorder, bipolar type ([place]) 05/31/2016   Cocaine use disorder, severe, dependence ([place]) 05/31/2016   Major depressive disorder, recurrent episode ([place]) 05/30/2016   Suicidal behavior with attempted self-injury ([place]) 05/08/2016   Acetaminophen overdose 05/06/2016   PCP:  Marliss Coots, NP Pharmacy:  [facility] [place], [name] - [address] [name] [address] Phone: [phone] Fax: [phone]  [facility] [address] [name] [address] Phone: [phone] Fax: [phone]     Social Determinants of Health (SDOH) Interventions    Readmission Risk Interventions    08/18/2022    9:00 AM 07/18/2022    4:11 PM 04/08/2022    1:43 PM  Readmission Risk Prevention Plan  Transportation Screening Complete Complete Complete  Medication Review (RN Care Manager) Complete Complete Complete  PCP or Specialist appointment within 3-5 days of discharge Complete Complete Complete  HRI or Home Care Consult Complete Complete Not  Complete  HRI or Home Care Consult Pt Refusal Comments   Pt is homeless  SW Recovery Care/Counseling Consult Complete Complete Complete  Palliative Care Screening Not Applicable  Not Applicable  Skilled Nursing Facility Not Applicable Complete Not Applicable

## 2022-09-23 NOTE — Plan of Care (Signed)
  Problem: Health Behavior/Discharge Planning: Goal: Ability to manage health-related needs will improve Outcome: Progressing   Problem: Clinical Measurements: Goal: Ability to maintain clinical measurements within normal limits will improve Outcome: Progressing Goal: Respiratory complications will improve Outcome: Progressing Goal: Cardiovascular complication will be avoided Outcome: Completed/Met   Problem: Activity: Goal: Risk for activity intolerance will decrease Outcome: Progressing   Problem: Nutrition: Goal: Adequate nutrition will be maintained Outcome: Completed/Met   Problem: Coping: Goal: Level of anxiety will decrease Outcome: Progressing   Problem: Elimination: Goal: Will not experience complications related to bowel motility Outcome: Progressing Goal: Will not experience complications related to urinary retention Outcome: Progressing   Problem: Pain Managment: Goal: General experience of comfort will improve Outcome: Progressing   Problem: Safety: Goal: Ability to remain free from injury will improve Outcome: Progressing   Problem: Skin Integrity: Goal: Risk for impaired skin integrity will decrease Outcome: Progressing

## 2022-09-23 NOTE — Progress Notes (Signed)
Patient refused AM labs

## 2022-09-23 NOTE — Progress Notes (Signed)
Occupational Therapy Treatment/Discharge Patient Details Name: [name] MRN: [id number] DOB: Jan 13, 1966 Today's Date: 09/23/2022   History of present illness Pt is a 56yo M who presented 9/16 with c/o chest pain, SOB, and leg swelling. Found to have B PE. PMH includes asthma, bioplar 1, depression, schizoaffective disorder, CVA, chronic CHF, homelessness, HTN, DM2, cocaine and cannabis use and R AKA.   OT comments  Pt has met all OT goals, able to demonstrate bathing, dressing and toileting tasks with Modified Independence from a wheelchair level during this admission. Pt able to complete squat pivot transfers with Modified Independence and without safety concerns. Pt demo UE HEP with theraband, putty and WB and denied any concerns with these tasks. Educated re: safety strategies for transporting items while using wheelchair and shower transfers with [facility] (asked medical team for shower order if deemed appropriate). Overall, pt is functioning at baseline from a wheelchair since R AKA and CVA. Pt would benefit from a new wheelchair at DC to maximize community mobility though no further skilled OT services needed at this time. OT to sign off at an acute level. Please reconsult if needs change.    Recommendations for follow up therapy are one component of a multi-disciplinary discharge planning process, led by the attending physician.  Recommendations may be updated based on patient status, additional functional criteria and insurance authorization.    Follow Up Recommendations  No OT follow up    Assistance Recommended at Discharge PRN  Patient can return home with the following  Assist for transportation   Equipment Recommendations  Wheelchair (measurements OT);Wheelchair cushion (measurements OT) (current wheelchair worn (L wheel))    Recommendations for Other Services      Precautions / Restrictions Precautions Precautions: Fall;Other (comment) Precaution Comments: hx of R AKA w/o  prosthetic Restrictions Weight Bearing Restrictions: No       Mobility Bed Mobility Overal bed mobility: Modified Independent Bed Mobility: Supine to Sit                Transfers Overall transfer level: Modified independent Equipment used: None Transfers: Bed to chair/wheelchair/BSC     Squat pivot transfers: Modified independent (Device/Increase time)       General transfer comment: from bed to w/c easily     Balance Overall balance assessment: Needs assistance Sitting-balance support: No upper extremity supported Sitting balance-Leahy Scale: Good                                     ADL either performed or assessed with clinical judgement   ADL Overall ADL's : Modified independent                 Upper Body Dressing : Modified independent;Sitting                   Functional mobility during ADLs: Modified independent General ADL Comments: Pt has previously demo to this OT MOD I toilet transfers, toileting tasks and LB dressing. Reports able to wash up without assist once provided with the linens from hallway closet. Discussed R UE HEP W/ T band, putty and WB with pt declining need for handout and confident with these exercises. Discussed transportation of beverage with w/c, suggested pt use lid on cups and was thrilled with this idea. Also suggested pushing heavily through R UE to combat tremors when propelling with R UE and holding item with L UE. pt also inquiring  about ability to shower in hospital, educated on need for MD order and discussed transfer options with drop arm BSC in shower. pt verbalized understanding    Extremity/Trunk Assessment Upper Extremity Assessment Upper Extremity Assessment: RUE deficits/detail RUE Deficits / Details: Ataxic RUE from prior CVA. Ataxic during all intentional reaching and AROM at the shoulder limited to 90 degrees. MMT 3+/5 overall distally except hand with Negative composite grip. With WB and  heavy lifting, R UE tremors cease but present when attempting to reach/open small containers, etc RUE Sensation: decreased light touch RUE Coordination: decreased gross motor;decreased fine motor   Lower Extremity Assessment Lower Extremity Assessment: Defer to PT evaluation        Vision   Vision Assessment?: No apparent visual deficits   Perception     Praxis      Cognition Arousal/Alertness: Awake/alert Behavior During Therapy: WFL for tasks assessed/performed, Agitated Overall Cognitive Status: Within Functional Limits for tasks assessed                                 General Comments: shows insight into deficits        Exercises      Shoulder Instructions       General Comments Pt with multiple valid questions regarding DC disposition as skilled therapy needs at SNF no longer needed. Discussed if nursing care needed, other DC ideas (motel, shelter) with pt also inquiring about drug rehab and transportation to OP clinic for prosthetic. Answered questions to best of ability and assured pt that SW/CM looking into options    Pertinent Vitals/ Pain       Pain Assessment Pain Assessment: No/denies pain  Home Living                                          Prior Functioning/Environment              Frequency           Progress Toward Goals  OT Goals(current goals can now be found in the care plan section)  Progress towards OT goals: Goals met/education completed, patient discharged from OT  Acute Rehab OT Goals Patient Stated Goal: find a place to stay, get a prosthetic OT Goal Formulation: With patient  Plan Discharge plan needs to be updated;All goals met and education completed, patient discharged from OT services    Co-evaluation                 AM-PAC OT "6 Clicks" Daily Activity     Outcome Measure   Help from another person eating meals?: None Help from another person taking care of personal  grooming?: None Help from another person toileting, which includes using toliet, bedpan, or urinal?: None Help from another person bathing (including washing, rinsing, drying)?: None Help from another person to put on and taking off regular upper body clothing?: None Help from another person to put on and taking off regular lower body clothing?: None 6 Click Score: 24    End of Session Equipment Utilized During Treatment: Other (comment) (w/c)  OT Visit Diagnosis: Unsteadiness on feet (R26.81);Muscle weakness (generalized) (M62.81);Other abnormalities of gait and mobility (R26.89)   Activity Tolerance Patient tolerated treatment well   Patient Left Other (comment) (in w/c)   Nurse Communication Mobility status;Other (comment) (inquiry about showering tasks)  Time: 7943-2761 OT Time Calculation (min): 35 min  Charges: OT General Charges $OT Visit: 1 Visit OT Treatments $Self Care/Home Management : 8-22 mins $Therapeutic Activity: 8-22 mins  Malachy Chamber, OTR/L Acute Rehab Services Office: [phone]   Layla Maw 09/23/2022, 2:32 PM

## 2022-09-23 NOTE — Progress Notes (Signed)
Physical Therapy Treatment Patient Details Name: [name] MRN: [id number] DOB: 1966/04/19 Today's Date: 09/23/2022   History of Present Illness Pt is a 56yo M who presented 9/16 with c/o chest pain, SOB, and leg swelling. Found to have B PE. PMH includes asthma, bioplar 1, depression, schizoaffective disorder, CVA, chronic CHF, homelessness, HTN, DM2, cocaine and cannabis use and R AKA.    PT Comments    Goals updated; pt refusing transfer/gait training today. Tolerated w/c mobility training using LUE to hold ice cup and navigate in multiple directions using LLE and RUE only, showing very good control. Pt became suddenly agitated when discussing progression with standing and ambulation. Perseverating on absence of RLE from BKA. Angered that we are not fitting him for an orthotic; explained this is not completed in the hospital but done on an outpatient basis after discharging from the hospital for customization. Offered to work on seated or bed level exercises for LE strengthening however pt became further agitated with this offer. Recommend continuation of skilled PT to focus on unmet goals, specifically more independence and safety with transfer and gait. However if pt declines to work further towards these higher level goals we will consider d/c from services.    Recommendations for follow up therapy are one component of a multi-disciplinary discharge planning process, led by the attending physician.  Recommendations may be updated based on patient status, additional functional criteria and insurance authorization.  Follow Up Recommendations  Long-term institutional care without follow-up therapy Can patient physically be transported by private vehicle: Yes   Assistance Recommended at Discharge Set up Supervision/Assistance  Patient can return home with the following A little help with walking and/or transfers;A little help with bathing/dressing/bathroom   Equipment Recommendations   Wheelchair (measurements PT);Wheelchair cushion (measurements PT)    Recommendations for Other Services       Precautions / Restrictions Precautions Precautions: Fall;Other (comment) Precaution Comments: hx of R AKA w/o prosthetic Restrictions Weight Bearing Restrictions: No     Mobility  Bed Mobility               General bed mobility comments: in w/c upon entry    Transfers                   General transfer comment: declines    Ambulation/Gait               General Gait Details: declines   Theme park manager mobility: Yes Wheelchair propulsion: Right upper extremity Wheelchair parts: Independent Distance: 100 Wheelchair Assistance Details (indicate cue type and reason): Education and training for carrying items in left UE, using RUE and LLE to propel w/c forward x100 feet. Great control with minimal deviation. Progressed with backwards carrying cup of ice in Lt hand and pushing back with LLE. Performed 360 degree turns with RUE and LLE left and right.  Modified Rankin (Stroke Patients Only)       Balance Overall balance assessment: Needs assistance Sitting-balance support: No upper extremity supported Sitting balance-Leahy Scale: Good                                      Cognition Arousal/Alertness: Awake/alert Behavior During Therapy: WFL for tasks assessed/performed, Agitated (labile) Overall Cognitive Status: History of cognitive impairments - at baseline Area of Impairment: Awareness  Awareness: Emergent   General Comments: aware of safety in terms of his deficits. Lacks awareness        Exercises      General Comments General comments (skin integrity, edema, etc.): Pt became suddenly agitated when discussing progression with standing and ambulation. Perseverating on absence of RLE from BKA. Offered to work on seated  or bed level exercises for LE strengthening however pt became increasingly agitated with this offer. Refused to work further with therapist.      Pertinent Vitals/Pain Pain Assessment Pain Assessment: No/denies pain Pain Intervention(s): Monitored during session    Home Living                          Prior Function            PT Goals (current goals can now be found in the care plan section) Acute Rehab PT Goals Patient Stated Goal: get a power chair PT Goal Formulation: With patient Time For Goal Achievement: 09/30/22 Potential to Achieve Goals: Fair Progress towards PT goals: Not progressing toward goals - comment    Frequency    Min 2X/week      PT Plan Current plan remains appropriate    Co-evaluation              AM-PAC PT "6 Clicks" Mobility   Outcome Measure  Help needed turning from your back to your side while in a flat bed without using bedrails?: None Help needed moving from lying on your back to sitting on the side of a flat bed without using bedrails?: None Help needed moving to and from a bed to a chair (including a wheelchair)?: None Help needed standing up from a chair using your arms (e.g., wheelchair or bedside chair)?: A Little Help needed to walk in hospital room?: A Lot Help needed climbing 3-5 steps with a railing? : Total 6 Click Score: 18    End of Session Equipment Utilized During Treatment: Gait belt Activity Tolerance: Patient tolerated treatment well Patient left: in chair (up in [facility])   PT Visit Diagnosis: Other abnormalities of gait and mobility (R26.89)     Time: 0950-1003 PT Time Calculation (min) (ACUTE ONLY): 13 min  Charges:  $Wheel Chair Management: 8-22 mins                     Candie Mile, PT, DPT Physical Therapist Acute Rehabilitation Services [place] 09/23/2022, 10:33 AM

## 2022-09-23 NOTE — Progress Notes (Signed)
PROGRESS NOTE    [name]  LKG:[id number] DOB: Sep 01, 1966 DOA: 08/16/2022 PCP: Marliss Coots, NP   Brief Narrative:  This 56 y.o.  male with history of chronic HFpEF, prior CVA with resultant right hemiparesis/facial droop, right AKA/left great toe amputation, VTE noncompliant with anticoagulation, DM-2, polysubstance abuse, bipolar disorder-who presented to the hospital with chest pain/shortness of breath-he was found to have bilateral PE with pulmonary infarct.  He was subsequently started on anticoagulation and admitted to the hospitalist service.  Limited echo showed EF of less than 20%.  Further hospital course was complicated by patient having suicidal ideations-psychiatry was consulted-bedside sitter was placed-with recommendations to pursue inpatient psychiatric admission.  Later patient was cleared by psychiatry, no indication for inpatient psych admission, social worker assisting with SNF placement, currently medically stable for discharge.  Assessment & Plan:   Principal Problem:   Pulmonary emboli (HCC) Active Problems:   Cocaine use disorder, severe, dependence (HCC)   Cocaine abuse with cocaine-induced mood disorder ([place])   Homeless   Suicidal ideation   Essential hypertension   Bipolar I disorder (HCC)   Chronic combined systolic and diastolic congestive heart failure ([place])  Pulmonary embolism with right lung infarct: Etiology likely due to noncompliance. Plan: Indefinite anticoagulation-given prior episode of VTE.   Chronic HFpEF/HFrEF:  Appears euvolemic Continue Lasix/Coreg/lisinopril.   Continue strict input and output, daily weights, fluid restriction. Outpatient cardiology follow-up-if he is compliant with medication-could be switched to [facility].   Suicidal ideations: Appreciate psych input - now stable, cleared by psych.   Outpatient follow-up with psychiatry.   Continue escitalopram, aripiprazole. He denies suicidal/ homicidal ideations.    Essential hypertension: Continue amlodipine/Coreg/lisinopril/Lasix. Blood pressure remains stable.   History of bipolar disorder Continue Abilify 10 mg daily.   History of CVA with right hemiparesis/facial droop Continue Eliquis 5 mg twice daily   Peripheral neuropathy: Continue Neurontin and as needed oxycodone.    DM-2 (A1c 6.0 on 9/17):  Continue CBGs with SSI.  History of right AKA-left great toe amputation:   Left medial malleoli ulcer Wound care as per wound care nurse recommendations.   COPD: Not in exacerbation Continue Brovana/Yupelri/budesonide-  will convert to inhaler regimen when closer to discharge.   Polysubstance abuse-cocaine Counseled.    Homelessness:  Difficult situation.  Claims he sleeps wherever he can-eats whatever he can get-claims he is on a wheelchair (right AKA/right hemiparesis).  Social worker following.   DVT prophylaxis: Eliquis Code Status: Full code Family Communication: No family at bedside Disposition Plan:   Status is: Inpatient Remains inpatient appropriate because: Awaiting safe disposition.  Currently medically stable for discharge.   Consultants:  Psychiatry  Procedures: None Antimicrobials: None.  Subjective: Patient was seen and examined at bedside.  No overnight events noted.   He denies any chest pain, vomiting.  He was sitting on the wheelchair. He states he does rounds inside the hallway.  Objective: Vitals:   09/22/22 2006 09/22/22 2113 09/23/22 0752 09/23/22 0839  BP:  132/75  132/88  Pulse:  65 65 82  Resp:  18 18 16   Temp:    (!) 97.4 F (36.3 C)  TempSrc:    Oral  SpO2: 98% 100% 98% 100%  Weight:      Height:        Intake/Output Summary (Last 24 hours) at 09/23/2022 1213 Last data filed at 09/23/2022 0800 Gross per 24 hour  Intake 900 ml  Output 300 ml  Net 600 ml   [employer]  08/16/22 1543 09/11/22 0502 09/17/22 2133  Weight: 86.2 kg 75.4 kg 75.4 kg   Examination:  General  exam: Appears comfortable, not in any acute distress, slow to respond. Respiratory system: CTA bilaterally, no wheezing, no crackles, normal respiratory effort. Cardiovascular system: S1 & S2 heard, regular rate and rhythm, no murmur. Gastrointestinal system: Abdomen is soft, non tender, non distended, BS+ Central nervous system: Alert and oriented x 3. No focal neurological deficits. Extremities: Right AKA, left great toe amputation Skin: No rashes, lesions or ulcers Psychiatry: Judgement and insight appear normal. Mood & affect appropriate.   Data Reviewed: I have personally reviewed following labs and imaging studies  CBC: No results for input(s): "WBC", "NEUTROABS", "HGB", "HCT", "MCV", "PLT" in the last 168 hours. Basic Metabolic Panel: No results for input(s): "NA", "K", "CL", "CO2", "GLUCOSE", "BUN", "CREATININE", "CALCIUM", "MG", "PHOS" in the last 168 hours. GFR: Estimated Creatinine Clearance: 103 mL/min (by C-G formula based on SCr of 0.82 mg/dL). Liver Function Tests: No results for input(s): "AST", "ALT", "ALKPHOS", "BILITOT", "PROT", "ALBUMIN" in the last 168 hours. No results for input(s): "LIPASE", "AMYLASE" in the last 168 hours. No results for input(s): "AMMONIA" in the last 168 hours. Coagulation Profile: No results for input(s): "INR", "PROTIME" in the last 168 hours. Cardiac Enzymes: No results for input(s): "CKTOTAL", "CKMB", "CKMBINDEX", "TROPONINI" in the last 168 hours. BNP (last 3 results) No results for input(s): "PROBNP" in the last 8760 hours. HbA1C: No results for input(s): "HGBA1C" in the last 72 hours. CBG: Recent Labs  Lab 09/22/22 1128 09/22/22 1525 09/22/22 2113 09/23/22 0722 09/23/22 1111  GLUCAP 105* 120* 141* 105* 89   Lipid Profile: No results for input(s): "CHOL", "HDL", "LDLCALC", "TRIG", "CHOLHDL", "LDLDIRECT" in the last 72 hours. Thyroid Function Tests: No results for input(s): "TSH", "T4TOTAL", "FREET4", "T3FREE", "THYROIDAB" in  the last 72 hours. Anemia Panel: No results for input(s): "VITAMINB12", "FOLATE", "FERRITIN", "TIBC", "IRON", "RETICCTPCT" in the last 72 hours. Sepsis Labs: No results for input(s): "PROCALCITON", "LATICACIDVEN" in the last 168 hours.  No results found for this or any previous visit (from the past 240 hour(s)).   Radiology Studies: No results found.  Scheduled Meds:  amLODipine  10 mg Oral Daily   apixaban  5 mg Oral BID   arformoterol  15 mcg Nebulization BID   ARIPiprazole  10 mg Oral Daily   budesonide (PULMICORT) nebulizer solution  0.25 mg Nebulization BID   carvedilol  3.125 mg Oral BID WC   escitalopram  5 mg Oral Daily   folic acid  1 mg Oral Daily   furosemide  40 mg Oral BID   gabapentin  400 mg Oral TID   insulin aspart  0-9 Units Subcutaneous TID WC   lisinopril  10 mg Oral Daily   revefenacin  175 mcg Nebulization Daily   thiamine  100 mg Oral Daily   Continuous Infusions:   LOS: 37 days    Time spent: 35 mins  Ramonda Galyon, MD [facility]   If 7PM-7AM, please contact night-coverage

## 2022-09-24 LAB — GLUCOSE, CAPILLARY
Glucose-Capillary: 112 mg/dL — ABNORMAL HIGH (ref 70–99)
Glucose-Capillary: 120 mg/dL — ABNORMAL HIGH (ref 70–99)
Glucose-Capillary: 137 mg/dL — ABNORMAL HIGH (ref 70–99)
Glucose-Capillary: 112 mg/dL — ABNORMAL HIGH (ref 70–99)

## 2022-09-24 MED ORDER — HYDRALAZINE HCL 20 MG/ML IJ SOLN: 10.0000 mg | INTRAMUSCULAR | Status: DC | PRN

## 2022-09-24 MED ORDER — TRAZODONE HCL 50 MG PO TABS
50.0000 mg | ORAL_TABLET | Freq: Every evening | ORAL | Status: DC | PRN
  Administered 2022-10-07: 50 mg via ORAL
  Filled 2022-09-24 (×4): qty 1

## 2022-09-24 MED ORDER — ACETAMINOPHEN 325 MG PO TABS: 650.0000 mg | ORAL_TABLET | Freq: Four times a day (QID) | ORAL | Status: DC | PRN

## 2022-09-24 MED ORDER — GUAIFENESIN 100 MG/5ML PO LIQD: 5.0000 mL | ORAL | Status: DC | PRN

## 2022-09-24 MED ORDER — IPRATROPIUM-ALBUTEROL 0.5-2.5 (3) MG/3ML IN SOLN: 3.0000 mL | RESPIRATORY_TRACT | Status: DC | PRN

## 2022-09-24 MED ORDER — GABAPENTIN 300 MG PO CAPS
600.0000 mg | ORAL_CAPSULE | Freq: Three times a day (TID) | ORAL | Status: DC
  Administered 2022-09-24 – 2022-10-09 (×45): 600 mg via ORAL
  Filled 2022-09-24 (×46): qty 2

## 2022-09-24 MED ORDER — SENNOSIDES-DOCUSATE SODIUM 8.6-50 MG PO TABS: 1.0000 | ORAL_TABLET | Freq: Every evening | ORAL | Status: DC | PRN

## 2022-09-24 MED ORDER — METOPROLOL TARTRATE 5 MG/5ML IV SOLN: 5.0000 mg | INTRAVENOUS | Status: DC | PRN

## 2022-09-24 NOTE — TOC Progression Note (Signed)
Transition of Care [facility]) - Initial/Assessment Note    Patient Details  Name: [name] MRN: [id number] Date of Birth: [birth year]  Transition of Care [facility]) CM/SW Contact:    Milinda Antis, [place] Phone Number: 09/24/2022, 2:36 PM  Clinical Narrative:                 [name] contacted admissions at [facility] to inquire about the facility's ability to accept the patient.  There was no answer.  [name] left a secure VM requesting a returned call.  Expected Discharge Plan: Skilled Nursing Facility Barriers to Discharge: [place] (PASRR), Insurance Authorization, SNF Pending bed offer, Active Substance Use - Placement, Homeless with medical needs   Patient Goals and CMS Choice Patient states their goals for this hospitalization and ongoing recovery are:: Rehab CMS Medicare.gov Compare Post Acute Care list provided to:: Patient Choice offered to / list presented to : Patient  Expected Discharge Plan and Services Expected Discharge Plan: [place] In-house Referral: Clinical Social Work   Post Acute Care Choice: [place] Living arrangements for the past 2 months: Homeless                                      Prior Living Arrangements/Services Living arrangements for the past 2 months: Homeless Lives with:: Self Patient language and need for interpreter reviewed:: Yes Do you feel safe going back to the place where you live?: Yes      Need for Family Participation in Patient Care: No (Comment) Care giver support system in place?: No (comment) Current home services: DME Administrator, Civil Service) Criminal Activity/Legal Involvement Pertinent to Current Situation/Hospitalization: No - Comment as needed  Activities of Daily Living      Permission Sought/Granted                  Emotional Assessment Appearance:: Appears stated age     Orientation: : Oriented to Self, Oriented to Place, Oriented to  Time, Oriented to  Situation Alcohol / Substance Use: Illicit Drugs Psych Involvement: Yes (comment)  Admission diagnosis:  Pulmonary emboli (HCC) [I26.99] Acute pulmonary embolism, unspecified pulmonary embolism type, unspecified whether acute cor pulmonale present (HCC) [I26.99] Patient Active Problem List   Diagnosis Date Noted   Pulmonary emboli ([place]) 08/17/2022   Venous ulcer ([place]) 07/23/2022   Sinus bradycardia 07/23/2022   Anemia of chronic disease 07/23/2022   S/P AKA (above knee amputation), right ([place]) 07/23/2022   Acute on chronic systolic congestive heart failure ([place]) 07/12/2022   History of DVT (deep vein thrombosis) 07/12/2022   CAP (community acquired pneumonia) 07/12/2022   Chronic venous hypertension (idiopathic) with ulcer and inflammation of bilateral lower extremity ([place])    Sepsis due to cellulitis ([place]) 04/04/2022   Normocytic anemia 04/04/2022   Acute deep vein thrombosis (DVT) of brachial vein ([place]) 03/31/2022   Swelling of lower extremity 03/23/2022   SIRS (systemic inflammatory response syndrome) ([place]) 03/23/2022   Leg swelling 03/23/2022   Acute on chronic systolic heart failure ([place]) 02/26/2022   Seizure ([place]) 02/26/2022   Venous stasis ulcer of right ankle limited to breakdown of skin with varicose veins ([place])    Hypertensive urgency 01/29/2022   Prolonged QT interval 01/29/2022   Alcohol abuse 01/29/2022   chronic ulcers 01/29/2022   Wound infection 12/04/2021   Open wounds involving multiple regions of lower extremity 12/03/2021   Mixed diabetic hyperlipidemia associated with  type 2 diabetes mellitus ([place]) 12/03/2021   Cellulitis of right lower extremity 11/13/2021   Type 2 diabetes mellitus with diabetic polyneuropathy, without long-term current use of insulin ([place]) 11/13/2021   Antisocial personality disorder ([place]) 11/04/2021   Malingering 11/04/2021   Foot pain 04/23/2021   Chronic combined systolic and diastolic congestive heart failure ([place]) 03/02/2021    History of stroke 03/02/2021   Polysubstance abuse ([place]) 03/02/2021   Skin ulcer of right great toe with necrosis of bone ([place])    Encounter for assessment of healthcare decision-making capacity    Pain, dental    Dystonia    Dysarthria    Shock liver 00/92/3300   Acute metabolic encephalopathy 76/22/6333   Coronary artery disease involving native coronary artery of native heart without angina pectoris 01/04/2021   Essential hypertension 01/04/2021   Bipolar I disorder ([place]) 01/04/2021   Cocaine abuse ([place]) 01/04/2021   Homelessness 01/04/2021   HLD (hyperlipidemia) 01/04/2021   Osteomyelitis of great toe (HCC)    Ulcer of great toe, left, with necrosis of bone (HCC)    Ulcer of great toe, right, with necrosis of muscle (HCC)    Acute osteomyelitis of toe, left (HCC)    Acute on chronic combined systolic and diastolic CHF (congestive heart failure) ([place]) 12/20/2020   Respiratory failure ([place])    Ischemic stroke ([place]) 12/17/2020   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   Diabetic foot ulcer ([place]) 12/08/2020   CHF (congestive heart failure) ([place]) 11/14/2020   CHF exacerbation ([place]) 11/13/2020   Hypokalemia    Hyperlipidemia    Acute hypoxemic respiratory failure ([place]) 10/26/2020   COPD with acute exacerbation ([place]) 10/14/2020   Acute on chronic combined systolic and diastolic CHF (congestive heart failure) ([place]) 10/02/2020   Heart failure with reduced ejection fraction and diastolic dysfunction ([place]) 09/07/2020   Near syncope 07/21/2020   Acid reflux 06/03/2020   Cannabis abuse 03/19/2020   Acute bronchitis due to Rhinovirus 09/08/2019   Schizoaffective disorder ([place]) 06/24/2019   SOB (shortness of breath)    QT prolongation 05/25/2019   Cocaine abuse (HCC)    Elevated troponin    Homeless    Suicidal ideation    Bilateral lower extremity pain 04/05/2019   Housing situation unstable 03/28/2019   Bilateral lower extremity edema    Acute exacerbation of CHF (congestive heart  failure) ([place]) 03/04/2019   Alcohol withdrawal syndrome, with delirium ([place]) 05/03/2018   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   Mild intermittent asthma without complication 54/56/2563   Asthma 12/26/2016   Tobacco use disorder 12/26/2016   Schizophrenia ([place]) 12/26/2016   Severe episode of recurrent major depressive disorder, with psychotic features ([place]) 10/31/2016   Cocaine abuse with cocaine-induced mood disorder ([place]) 09/06/2016   Alcohol use disorder, mild, abuse 07/31/2016   Substance induced mood disorder ([place]) 07/30/2016   Schizoaffective disorder, bipolar type ([place]) 05/31/2016   Cocaine use disorder, severe, dependence ([place]) 05/31/2016   Major depressive disorder, recurrent episode ([place]) 05/30/2016   Suicidal behavior with attempted self-injury ([place]) 05/08/2016   Acetaminophen overdose 05/06/2016   PCP:  Marliss Coots, NP Pharmacy:   [place] [facility], [place] [address] [place] [address] Phone: [phone] Fax: [place] [address] Phone: [phone] Fax: [phone]     Social Determinants of Health (SDOH) Interventions    Readmission Risk Interventions    08/18/2022  9:00 AM 07/18/2022    4:11 PM 04/08/2022    1:43 PM  Readmission Risk Prevention Plan  Transportation Screening Complete Complete Complete  Medication Review Press photographer) Complete Complete Complete  PCP or Specialist appointment within 3-5 days of discharge Complete Complete Complete  HRI or Home Care Consult Complete Complete Not Complete  HRI or Home Care Consult Pt Refusal Comments   Pt is homeless  SW Recovery Care/Counseling Consult Complete Complete Complete  Palliative Care Screening Not Applicable  Not [place] Not Applicable Complete Not Applicable

## 2022-09-24 NOTE — Progress Notes (Signed)
PROGRESS NOTE    [name]  RCV:[id number] DOB: 1966-04-11 DOA: 08/16/2022 PCP: Marliss Coots, NP   Brief Narrative:  56 y.o.  male with history of chronic HFpEF, prior CVA with resultant right hemiparesis/facial droop, right AKA/left great toe amputation, VTE noncompliant with anticoagulation, DM-2, polysubstance abuse, bipolar disorder-who presented to the hospital with chest pain/shortness of breath-he was found to have bilateral PE with pulmonary infarct.  He was subsequently started on anticoagulation and admitted to the hospitalist service.  Limited echo showed EF of less than 20%.  Further hospital course was complicated by patient having suicidal ideations-psychiatry was consulted-bedside sitter was placed-with recommendations to pursue inpatient psychiatric admission.  Later patient was cleared by psychiatry, no indication for inpatient psych admission, social worker assisting with SNF placement, currently medically stable for discharge.     Assessment & Plan:  Principal Problem:   Pulmonary emboli (HCC) Active Problems:   Cocaine use disorder, severe, dependence (HCC)   Cocaine abuse with cocaine-induced mood disorder ([place])   Homeless   Suicidal ideation   Essential hypertension   Bipolar I disorder (HCC)   Chronic combined systolic and diastolic congestive heart failure (HCC)    Right-sided pulmonary embolism with right lung infarct: Etiology likely due to noncompliance. Indefinite anticoagulation, on Eliquis.   Chronic HFrEF EF < 20%.  Class II Appears euvolemic.  Continue Coreg, Lasix, lisinopril Can be placed on outpatient Entresto if he remains compliant   Suicidal ideations: Initially had suicidal ideation and seen by psychiatry now he is cleared for discharge as he is no longer exhibiting suicidal or homicidal ideations Continue Abilify and Lexapro   Essential hypertension: Continue Norvasc, Coreg, Lasix and lisinopril.  IV as needed ordered   History  of bipolar disorder Continue Abilify 10 mg daily.   History of CVA with right hemiparesis/facial droop Continue Eliquis 5 mg twice daily   Peripheral neuropathy: Continue Neurontin 3 times daily, will increase the dose to 600 mg today and as needed oxycodone.    DM-2 (A1c 6.0 on 9/17):  Continue CBGs with SSI.  Blood glucose in acceptable range   History of right AKA-left great toe amputation: Supportive care   Left medial malleoli ulcer Wound care as per wound care nurse recommendations.   COPD: Not in exacerbation Continue Brovana/Yupelri/budesonide-  will convert to inhaler regimen when closer to discharge.   Polysubstance abuse-cocaine Counseled.    Homelessness:  Difficult situation.  Claims he sleeps wherever he can-eats whatever he can get-claims he is on a wheelchair (right AKA/right hemiparesis).  Social worker following.   Plans for long-term institutional care without therapy follow-up   DVT prophylaxis: On Eliquis Code Status: Full code Family Communication:    Status is: Inpatient Difficult placement, TOC following      Subjective: Doing okay no complaints.  Requesting to increase his gabapentin dose to help with this discomfort and pain   Examination:  General exam: Appears calm and comfortable  Respiratory system: Clear to auscultation. Respiratory effort normal. Cardiovascular system: S1 & S2 heard, RRR. No JVD, murmurs, rubs, gallops or clicks. No pedal edema. Gastrointestinal system: Abdomen is nondistended, soft and nontender. No organomegaly or masses felt. Normal bowel sounds heard. Central nervous system: Alert and oriented. No focal neurological deficits. Extremities:  Left great toe amputation, right-sided AKA Skin: No rashes, lesions or ulcers Psychiatry: Judgement and insight appear normal. Mood & affect appropriate.     Objective: Vitals:   09/23/22 0839 09/23/22 1741 09/23/22 2155 09/24/22 0608  BP:  132/88 (!) 135/91 (!)  149/86 (!) 128/101  Pulse: 82 88 74 81  Resp: 16 17 18 17   Temp: (!) 97.4 F (36.3 C) 98 F (36.7 C) 97.7 F (36.5 C) 98 F (36.7 C)  TempSrc: Oral Oral Oral Oral  SpO2: 100% 99% 99% 99%  Weight:      Height:        Intake/Output Summary (Last 24 hours) at 09/24/2022 0742 Last data filed at 09/24/2022 0600 Gross per 24 hour  Intake 1120 ml  Output 600 ml  Net 520 ml   [employer]   08/16/22 1543 09/11/22 0502 09/17/22 2133  Weight: 86.2 kg 75.4 kg 75.4 kg     Data Reviewed:   CBC: No results for input(s): "WBC", "NEUTROABS", "HGB", "HCT", "MCV", "PLT" in the last 168 hours. Basic Metabolic Panel: No results for input(s): "NA", "K", "CL", "CO2", "GLUCOSE", "BUN", "CREATININE", "CALCIUM", "MG", "PHOS" in the last 168 hours. GFR: Estimated Creatinine Clearance: 103 mL/min (by C-G formula based on SCr of 0.82 mg/dL). Liver Function Tests: No results for input(s): "AST", "ALT", "ALKPHOS", "BILITOT", "PROT", "ALBUMIN" in the last 168 hours. No results for input(s): "LIPASE", "AMYLASE" in the last 168 hours. No results for input(s): "AMMONIA" in the last 168 hours. Coagulation Profile: No results for input(s): "INR", "PROTIME" in the last 168 hours. Cardiac Enzymes: No results for input(s): "CKTOTAL", "CKMB", "CKMBINDEX", "TROPONINI" in the last 168 hours. BNP (last 3 results) No results for input(s): "PROBNP" in the last 8760 hours. HbA1C: No results for input(s): "HGBA1C" in the last 72 hours. CBG: Recent Labs  Lab 09/22/22 2113 09/23/22 0722 09/23/22 1111 09/23/22 1620 09/23/22 2150  GLUCAP 141* 105* 89 114* 100*   Lipid Profile: No results for input(s): "CHOL", "HDL", "LDLCALC", "TRIG", "CHOLHDL", "LDLDIRECT" in the last 72 hours. Thyroid Function Tests: No results for input(s): "TSH", "T4TOTAL", "FREET4", "T3FREE", "THYROIDAB" in the last 72 hours. Anemia Panel: No results for input(s): "VITAMINB12", "FOLATE", "FERRITIN", "TIBC", "IRON", "RETICCTPCT" in  the last 72 hours. Sepsis Labs: No results for input(s): "PROCALCITON", "LATICACIDVEN" in the last 168 hours.  No results found for this or any previous visit (from the past 240 hour(s)).       Radiology Studies: No results found.      Scheduled Meds:  amLODipine  10 mg Oral Daily   apixaban  5 mg Oral BID   arformoterol  15 mcg Nebulization BID   ARIPiprazole  10 mg Oral Daily   budesonide (PULMICORT) nebulizer solution  0.25 mg Nebulization BID   carvedilol  3.125 mg Oral BID WC   escitalopram  5 mg Oral Daily   folic acid  1 mg Oral Daily   furosemide  40 mg Oral BID   gabapentin  400 mg Oral TID   insulin aspart  0-9 Units Subcutaneous TID WC   lisinopril  10 mg Oral Daily   revefenacin  175 mcg Nebulization Daily   thiamine  100 mg Oral Daily   Continuous Infusions:   LOS: 38 days   Time spent= 35 mins    Nadira Single 09/25/22, MD [facility]  If 7PM-7AM, please contact night-coverage  09/24/2022, 7:42 AM

## 2022-09-24 NOTE — Progress Notes (Signed)
Mobility Specialist Progress Note:   09/24/22 0930  Mobility  Activity Transferred from bed to chair (to w/c)  Level of Assistance Modified independent, requires aide device or extra time  Assistive Device Wheelchair  Distance Ambulated (ft) 100 ft (self-propel in w/c)  Activity Response Tolerated well  Mobility Referral Yes  $Mobility charge 1 Mobility   Pt eager for mobility session. Transferred to w/c independently, then able to self-propel in hallway. Pt left with all needs met.  Nelta Numbers Acute Rehab Secure Chat or Office Phone: [phone]

## 2022-09-25 LAB — MAGNESIUM: Magnesium: 1.9 mg/dL (ref 1.7–2.4)

## 2022-09-25 LAB — GLUCOSE, CAPILLARY
Glucose-Capillary: 114 mg/dL — ABNORMAL HIGH (ref 70–99)
Glucose-Capillary: 86 mg/dL (ref 70–99)
Glucose-Capillary: 116 mg/dL — ABNORMAL HIGH (ref 70–99)
Glucose-Capillary: 126 mg/dL — ABNORMAL HIGH (ref 70–99)

## 2022-09-25 LAB — BASIC METABOLIC PANEL
Anion gap: 12 (ref 5–15)
BUN: 21 mg/dL — ABNORMAL HIGH (ref 6–20)
CO2: 20 mmol/L — ABNORMAL LOW (ref 22–32)
Calcium: 9.4 mg/dL (ref 8.9–10.3)
Chloride: 106 mmol/L (ref 98–111)
Creatinine, Ser: 0.99 mg/dL (ref 0.61–1.24)
GFR, Estimated: >60 mL/min (ref >60)
Glucose, Bld: 101 mg/dL — ABNORMAL HIGH (ref 70–99)
Potassium: 4.2 mmol/L (ref 3.5–5.1)
Sodium: 138 mmol/L (ref 135–145)

## 2022-09-25 LAB — CBC
HCT: 41.3 % (ref 39.0–52.0)
Hemoglobin: 14 g/dL (ref 13.0–17.0)
MCH: 29.9 pg (ref 26.0–34.0)
MCHC: 33.9 g/dL (ref 30.0–36.0)
MCV: 88.1 fL (ref 80.0–100.0)
Platelets: 211 10*3/uL (ref 150–400)
RBC: 4.69 MIL/uL (ref 4.22–5.81)
RDW: 18.3 % — ABNORMAL HIGH (ref 11.5–15.5)
WBC: 6.6 10*3/uL (ref 4.0–10.5)
nRBC: 0 % (ref 0.0–0.2)

## 2022-09-25 NOTE — Progress Notes (Signed)
Mobility Specialist Progress Note:   09/25/22 0915  Mobility  Activity Transferred from bed to chair (+ self-propelled)  Level of Assistance Standby assist, set-up cues, supervision of patient - no hands on  Assistive Device Wheelchair  Distance Ambulated (ft) 500 ft  Activity Response Tolerated well  Mobility Referral Yes  $Mobility charge 1 Mobility   Pt eager for mobility session this am. No physical assistance required. Pt left self-propelling in hallway.   Nelta Numbers Acute Rehab Secure Chat or Office Phone: [phone]

## 2022-09-25 NOTE — Progress Notes (Signed)
PROGRESS NOTE    [name]  [id number] DOB: 1966-02-03 DOA: 08/16/2022 PCP: Marliss Coots, NP   Brief Narrative:  56 y.o.  male with history of chronic HFpEF, prior CVA with resultant right hemiparesis/facial droop, right AKA/left great toe amputation, VTE noncompliant with anticoagulation, DM-2, polysubstance abuse, bipolar disorder-who presented to the hospital with chest pain/shortness of breath-he was found to have bilateral PE with pulmonary infarct.  He was subsequently started on anticoagulation and admitted to the hospitalist service.  Limited echo showed EF of less than 20%.  Further hospital course was complicated by patient having suicidal ideations-psychiatry was consulted-bedside sitter was placed-with recommendations to pursue inpatient psychiatric admission.  Later patient was cleared by psychiatry, no indication for inpatient psych admission, social worker assisting with SNF placement, currently medically stable for discharge.     Assessment & Plan:  Principal Problem:   Pulmonary emboli (HCC) Active Problems:   Cocaine use disorder, severe, dependence (HCC)   Cocaine abuse with cocaine-induced mood disorder ([place])   Homeless   Suicidal ideation   Essential hypertension   Bipolar I disorder (HCC)   Chronic combined systolic and diastolic congestive heart failure (HCC)    Right-sided pulmonary embolism with right lung infarct: Etiology likely due to noncompliance. Indefinite anticoagulation, on Eliquis.   Chronic HFrEF EF < 20%.  Class II Appears euvolemic.  Continue Coreg, Lasix, lisinopril Can be placed on outpatient Entresto if he remains compliant   Suicidal ideations: Initially had suicidal ideation and seen by psychiatry now he is cleared for discharge as he is no longer exhibiting suicidal or homicidal ideations Continue Abilify and Lexapro   Essential hypertension: Continue Norvasc, Coreg, Lasix and lisinopril.  IV as needed ordered   History  of bipolar disorder Continue Abilify 10 mg daily.   History of CVA with right hemiparesis/facial droop Continue Eliquis 5 mg twice daily   Peripheral neuropathy: Continue Neurontin 3 times daily, increased the dose to 600 mg today and as needed oxycodone.    DM-2 (A1c 6.0 on 9/17):  Continue CBGs with SSI.  Blood glucose in acceptable range   History of right AKA-left great toe amputation: Supportive care   Left medial malleoli ulcer Wound care as per wound care nurse recommendations.   COPD: Not in exacerbation Continue Brovana/Yupelri/budesonide-  will convert to inhaler regimen when closer to discharge.   Polysubstance abuse-cocaine Counseled.    Homelessness:  Difficult situation.  Claims he sleeps wherever he can-eats whatever he can get-claims he is on a wheelchair (right AKA/right hemiparesis).  Social worker following.   Plans for long-term institutional care without therapy follow-up   DVT prophylaxis: On Eliquis Code Status: Full code Family Communication:    Status is: Inpatient Difficult placement, TOC following      Subjective: Resting comfortably, no complaints this morning   Examination: Constitutional: Not in acute distress Respiratory: Clear to auscultation bilaterally Cardiovascular: Normal sinus rhythm, no rubs Abdomen: Nontender nondistended good bowel sounds Musculoskeletal: Left great toe amputation, right-sided AKA Skin: No rashes seen Neurologic: CN 2-12 grossly intact.  And nonfocal Psychiatric: Normal judgment and insight. Alert and oriented x 3. Normal mood.   Objective: Vitals:   09/24/22 1654 09/24/22 2301 09/25/22 0832 09/25/22 0949  BP: 128/83 (!) 135/92 118/83   Pulse: 81 75 88   Resp: 16 18 18    Temp: 98 F (36.7 C) 97.6 F (36.4 C) 97.7 F (36.5 C)   TempSrc: Oral Oral Oral   SpO2: 97% 96% 100% 98%  Weight:  Height:        Intake/Output Summary (Last 24 hours) at 09/25/2022 1011 Last data filed at  09/25/2022 0842 Gross per 24 hour  Intake 1200 ml  Output --  Net 1200 ml   Filed Weights   08/16/22 1543 09/11/22 0502 09/17/22 2133  Weight: 86.2 kg 75.4 kg 75.4 kg     Data Reviewed:   CBC: Recent Labs  Lab 09/24/22 2343  WBC 6.6  HGB 14.0  HCT 41.3  MCV 88.1  PLT 092   Basic Metabolic Panel: Recent Labs  Lab 09/24/22 2343  NA 138  K 4.2  CL 106  CO2 20*  GLUCOSE 101*  BUN 21*  CREATININE 0.99  CALCIUM 9.4  MG 1.9   GFR: Estimated Creatinine Clearance: 85.3 mL/min (by C-G formula based on SCr of 0.99 mg/dL). Liver Function Tests: No results for input(s): "AST", "ALT", "ALKPHOS", "BILITOT", "PROT", "ALBUMIN" in the last 168 hours. No results for input(s): "LIPASE", "AMYLASE" in the last 168 hours. No results for input(s): "AMMONIA" in the last 168 hours. Coagulation Profile: No results for input(s): "INR", "PROTIME" in the last 168 hours. Cardiac Enzymes: No results for input(s): "CKTOTAL", "CKMB", "CKMBINDEX", "TROPONINI" in the last 168 hours. BNP (last 3 results) No results for input(s): "PROBNP" in the last 8760 hours. HbA1C: No results for input(s): "HGBA1C" in the last 72 hours. CBG: Recent Labs  Lab 09/24/22 0746 09/24/22 1115 09/24/22 1642 09/24/22 2307 09/25/22 0717  GLUCAP 112* 120* 137* 112* 114*   Lipid Profile: No results for input(s): "CHOL", "HDL", "LDLCALC", "TRIG", "CHOLHDL", "LDLDIRECT" in the last 72 hours. Thyroid Function Tests: No results for input(s): "TSH", "T4TOTAL", "FREET4", "T3FREE", "THYROIDAB" in the last 72 hours. Anemia Panel: No results for input(s): "VITAMINB12", "FOLATE", "FERRITIN", "TIBC", "IRON", "RETICCTPCT" in the last 72 hours. Sepsis Labs: No results for input(s): "PROCALCITON", "LATICACIDVEN" in the last 168 hours.  No results found for this or any previous visit (from the past 240 hour(s)).       Radiology Studies: No results found.      Scheduled Meds:  amLODipine  10 mg Oral Daily    apixaban  5 mg Oral BID   arformoterol  15 mcg Nebulization BID   ARIPiprazole  10 mg Oral Daily   budesonide (PULMICORT) nebulizer solution  0.25 mg Nebulization BID   carvedilol  3.125 mg Oral BID WC   escitalopram  5 mg Oral Daily   folic acid  1 mg Oral Daily   furosemide  40 mg Oral BID   gabapentin  600 mg Oral TID   insulin aspart  0-9 Units Subcutaneous TID WC   lisinopril  10 mg Oral Daily   revefenacin  175 mcg Nebulization Daily   thiamine  100 mg Oral Daily   Continuous Infusions:   LOS: 39 days   Time spent= 35 mins    Bartley Vuolo Arsenio Loader, MD [facility]  If 7PM-7AM, please contact night-coverage  09/25/2022, 10:11 AM

## 2022-09-26 LAB — GLUCOSE, CAPILLARY
Glucose-Capillary: 82 mg/dL (ref 70–99)
Glucose-Capillary: 89 mg/dL (ref 70–99)
Glucose-Capillary: 105 mg/dL — ABNORMAL HIGH (ref 70–99)
Glucose-Capillary: 138 mg/dL — ABNORMAL HIGH (ref 70–99)

## 2022-09-26 NOTE — TOC Progression Note (Signed)
Transition of Care [facility]) - Initial/Assessment Note    Patient Details  Name: [name] MRN: [id number] Date of Birth: Jul 08, 1966  Transition of Care [facility]) CM/SW Contact:    Ralene Bathe, LCSWA Phone Number: 09/26/2022, 10:33 AM  Clinical Narrative:                 [name] reached out to [facility] with intake at [facility] to inquire about LT beds.  This facility does not have any beds available, but [facility] facility will send a representative to do a bedside assessment of the patient to see if he is appropriate for the facility.  [name] will continue to follow.    Expected Discharge Plan: Skilled Nursing Facility Barriers to Discharge: Awaiting State Approval (PASRR), Insurance Authorization, SNF Pending bed offer, Active Substance Use - Placement, Homeless with medical needs   Patient Goals and CMS Choice Patient states their goals for this hospitalization and ongoing recovery are:: Rehab CMS Medicare.gov Compare Post Acute Care list provided to:: Patient Choice offered to / list presented to : Patient  Expected Discharge Plan and Services Expected Discharge Plan: Skilled Nursing Facility In-house Referral: Clinical Social Work   Post Acute Care Choice: Skilled Nursing Facility Living arrangements for the past 2 months: Homeless                                      Prior Living Arrangements/Services Living arrangements for the past 2 months: Homeless Lives with:: Self Patient language and need for interpreter reviewed:: Yes Do you feel safe going back to the place where you live?: Yes      Need for Family Participation in Patient Care: No (Comment) Care giver support system in place?: No (comment) 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) Criminal Activity/Legal Involvement Pertinent to Current Situation/Hospitalization: No - Comment as needed  Activities of Daily Living      Permission Sought/Granted                  Emotional  Assessment Appearance:: Appears stated age     Orientation: : Oriented to Self, Oriented to Place, Oriented to  Time, Oriented to Situation Alcohol / Substance Use: Illicit Drugs Psych Involvement: Yes (comment)  Admission diagnosis:  Pulmonary emboli (HCC) [I26.99] Acute pulmonary embolism, unspecified pulmonary embolism type, unspecified whether acute cor pulmonale present (HCC) [I26.99] Patient Active Problem List   Diagnosis Date Noted   Pulmonary emboli (HCC) 08/17/2022   Venous ulcer (HCC) 07/23/2022   Sinus bradycardia 07/23/2022   Anemia of chronic disease 07/23/2022   S/P AKA (above knee amputation), right (HCC) 07/23/2022   Acute on chronic systolic congestive heart failure (HCC) 07/12/2022   History of DVT (deep vein thrombosis) 07/12/2022   CAP (community acquired pneumonia) 07/12/2022   Chronic venous hypertension (idiopathic) with ulcer and inflammation of bilateral lower extremity (HCC)    Sepsis due to cellulitis (HCC) 04/04/2022   Normocytic anemia 04/04/2022   Acute deep vein thrombosis (DVT) of brachial vein (HCC) 03/31/2022   Swelling of lower extremity 03/23/2022   SIRS (systemic inflammatory response syndrome) (HCC) 03/23/2022   Leg swelling 03/23/2022   Acute on chronic systolic heart failure (HCC) 02/26/2022   Seizure (HCC) 02/26/2022   Venous stasis ulcer of right ankle limited to breakdown of skin with varicose veins (HCC)    Hypertensive urgency 01/29/2022   Prolonged QT interval 01/29/2022   Alcohol abuse 01/29/2022  chronic ulcers 01/29/2022   Wound infection 12/04/2021   Open wounds involving multiple regions of lower extremity 12/03/2021   Mixed diabetic hyperlipidemia associated with type 2 diabetes mellitus ([place]) 12/03/2021   Cellulitis of right lower extremity 11/13/2021   Type 2 diabetes mellitus with diabetic polyneuropathy, without long-term current use of insulin ([place]) 11/13/2021   Antisocial personality disorder ([place]) 11/04/2021    Malingering 11/04/2021   Foot pain 04/23/2021   Chronic combined systolic and diastolic congestive heart failure ([place]) 03/02/2021   History of stroke 03/02/2021   Polysubstance abuse ([place]) 03/02/2021   Skin ulcer of right great toe with necrosis of bone ([place])    Encounter for assessment of healthcare decision-making capacity    Pain, dental    Dystonia    Dysarthria    Shock liver 79/01/4096   Acute metabolic encephalopathy 35/32/9924   Coronary artery disease involving native coronary artery of native heart without angina pectoris 01/04/2021   Essential hypertension 01/04/2021   Bipolar I disorder ([place]) 01/04/2021   Cocaine abuse ([place]) 01/04/2021   Homelessness 01/04/2021   HLD (hyperlipidemia) 01/04/2021   Osteomyelitis of great toe (HCC)    Ulcer of great toe, left, with necrosis of bone (HCC)    Ulcer of great toe, right, with necrosis of muscle (HCC)    Acute osteomyelitis of toe, left (HCC)    Acute on chronic combined systolic and diastolic CHF (congestive heart failure) ([place]) 12/20/2020   Respiratory failure ([place])    Ischemic stroke ([place]) 12/17/2020   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   Diabetic foot ulcer ([place]) 12/08/2020   CHF (congestive heart failure) ([place]) 11/14/2020   CHF exacerbation ([place]) 11/13/2020   Hypokalemia    Hyperlipidemia    Acute hypoxemic respiratory failure ([place]) 10/26/2020   COPD with acute exacerbation ([place]) 10/14/2020   Acute on chronic combined systolic and diastolic CHF (congestive heart failure) ([place]) 10/02/2020   Heart failure with reduced ejection fraction and diastolic dysfunction ([place]) 09/07/2020   Near syncope 07/21/2020   Acid reflux 06/03/2020   Cannabis abuse 03/19/2020   Acute bronchitis due to Rhinovirus 09/08/2019   Schizoaffective disorder ([place]) 06/24/2019   SOB (shortness of breath)    QT prolongation 05/25/2019   Cocaine abuse ([place])    Elevated troponin    Homeless    Suicidal ideation    Bilateral lower extremity pain  04/05/2019   Housing situation unstable 03/28/2019   Bilateral lower extremity edema    Acute exacerbation of CHF (congestive heart failure) ([place]) 03/04/2019   Alcohol withdrawal syndrome, with delirium ([place]) 05/03/2018   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   Mild intermittent asthma without complication 26/83/4196   Asthma 12/26/2016   Tobacco use disorder 12/26/2016   Schizophrenia ([place]) 12/26/2016   Severe episode of recurrent major depressive disorder, with psychotic features ([place]) 10/31/2016   Cocaine abuse with cocaine-induced mood disorder ([place]) 09/06/2016   Alcohol use disorder, mild, abuse 07/31/2016   Substance induced mood disorder ([place]) 07/30/2016   Schizoaffective disorder, bipolar type ([place]) 05/31/2016   Cocaine use disorder, severe, dependence ([place]) 05/31/2016   Major depressive disorder, recurrent episode ([place]) 05/30/2016   Suicidal behavior with attempted self-injury ([place]) 05/08/2016   Acetaminophen overdose 05/06/2016   PCP:  Marliss Coots, NP Pharmacy:   [facility] [address] [place] [address] Phone: [phone] Fax: [place]  [id number] Phone: [phone] Fax: [phone]     Social Determinants of Health (SDOH) Interventions    Readmission Risk Interventions    08/18/2022    9:00 AM 07/18/2022    4:11 PM 04/08/2022    1:43 PM  Readmission Risk Prevention Plan  Transportation Screening Complete Complete Complete  Medication Review Oceanographer) Complete Complete Complete  PCP or Specialist appointment within 3-5 days of discharge Complete Complete Complete  HRI or Home Care Consult Complete Complete Not Complete  HRI or Home Care Consult Pt Refusal Comments   Pt is homeless  SW Recovery Care/Counseling Consult Complete Complete Complete  Palliative Care Screening Not  Applicable  Not Applicable  Skilled Nursing Facility Not Applicable Complete Not Applicable

## 2022-09-26 NOTE — Progress Notes (Signed)
Physical Therapy Treatment Patient Details Name: [name] MRN: [id number] DOB: 1966/01/23 Today's Date: 09/26/2022   History of Present Illness Pt is a 56yo M who presented 9/16 with c/o chest pain, SOB, and leg swelling. Found to have B PE. PMH includes asthma, bioplar 1, depression, schizoaffective disorder, CVA, chronic CHF, homelessness, HTN, DM2, cocaine and cannabis use and R AKA.    PT Comments    Patient pleasant throughout session. Patient agreeable to gait progression. Able to increase distance slightly with x 3 seated rest breaks due to feeling SOB but no dyspnea noted during mobility.  No overt LOB and required min guard and w/c follow for comfort. Able to self propel self back to room independently. D/c plan remains appropriate.    Recommendations for follow up therapy are one component of a multi-disciplinary discharge planning process, led by the attending physician.  Recommendations may be updated based on patient status, additional functional criteria and insurance authorization.  Follow Up Recommendations  Long-term institutional care without follow-up therapy Can patient physically be transported by private vehicle: Yes   Assistance Recommended at Discharge Set up Supervision/Assistance  Patient can return home with the following A little help with walking and/or transfers;A little help with bathing/dressing/bathroom   Equipment Recommendations  Wheelchair (measurements PT);Wheelchair cushion (measurements PT)    Recommendations for Other Services       Precautions / Restrictions Precautions Precautions: Fall;Other (comment) Precaution Comments: hx of R AKA w/o prosthetic Restrictions Weight Bearing Restrictions: No     Mobility  Bed Mobility               General bed mobility comments: in w/c upon entry    Transfers Overall transfer level: Needs assistance Equipment used: Rolling Gerson Fauth (2 wheels) Transfers: Sit to/from Stand Sit to Stand:  Min guard           General transfer comment: min guard for safety. Cues for hand placement with intermittent follow through    Ambulation/Gait Ambulation/Gait assistance: Min guard Gait Distance (Feet): 90 Feet (x3 seated rest breaks every 20-25') Assistive device: Rolling Greely Atiyeh (2 wheels) Gait Pattern/deviations:  (hop to) Gait velocity: decreased         Stairs             Wheelchair Mobility    Modified Rankin (Stroke Patients Only)       Balance Overall balance assessment: Needs assistance Sitting-balance support: No upper extremity supported Sitting balance-Leahy Scale: Good     Standing balance support: During functional activity, Bilateral upper extremity supported Standing balance-Leahy Scale: Poor Standing balance comment: requires UE support in standing due to R AKA and has not received prosthetic yet                            Cognition Arousal/Alertness: Awake/alert Behavior During Therapy: WFL for tasks assessed/performed, Agitated Overall Cognitive Status: Within Functional Limits for tasks assessed                                 General Comments: WFL for simple mobility tasks. Shows insight into deficits and inquiring about discharge plan        Exercises      General Comments        Pertinent Vitals/Pain Pain Assessment Pain Assessment: No/denies pain    Home Living  Prior Function            PT Goals (current goals can now be found in the care plan section) Acute Rehab PT Goals Patient Stated Goal: to find out where I'm going PT Goal Formulation: With patient Time For Goal Achievement: 09/30/22 Potential to Achieve Goals: Fair Progress towards PT goals: Progressing toward goals    Frequency    Min 2X/week      PT Plan Current plan remains appropriate    Co-evaluation              AM-PAC PT "6 Clicks" Mobility   Outcome Measure  Help  needed turning from your back to your side while in a flat bed without using bedrails?: None Help needed moving from lying on your back to sitting on the side of a flat bed without using bedrails?: None Help needed moving to and from a bed to a chair (including a wheelchair)?: None Help needed standing up from a chair using your arms (e.g., wheelchair or bedside chair)?: A Little Help needed to walk in hospital room?: A Little Help needed climbing 3-5 steps with a railing? : Total 6 Click Score: 19    End of Session   Activity Tolerance: Patient tolerated treatment well Patient left: in chair (up in w/c) Nurse Communication: Mobility status PT Visit Diagnosis: Other abnormalities of gait and mobility (R26.89)     Time: 7793-9030 PT Time Calculation (min) (ACUTE ONLY): 15 min  Charges:  $Gait Training: 8-22 mins                     Laurna Shetley A. Gilford Rile PT, DPT Acute Rehabilitation Services Office [phone]    Linna Hoff 09/26/2022, 10:37 AM

## 2022-09-26 NOTE — Progress Notes (Signed)
PROGRESS NOTE    [name]  OIZ:[id number] DOB: 25-Feb-1966 DOA: 08/16/2022 PCP: [name], NP   Brief Narrative:  56 y.o.  male with history of chronic HFpEF, prior CVA with resultant right hemiparesis/facial droop, right AKA/left great toe amputation, VTE noncompliant with anticoagulation, DM-2, polysubstance abuse, bipolar disorder-who presented to the hospital with chest pain/shortness of breath-he was found to have bilateral PE with pulmonary infarct.  He was subsequently started on anticoagulation and admitted to the hospitalist service.  Limited echo showed EF of less than 20%.  Further hospital course was complicated by patient having suicidal ideations-psychiatry was consulted-bedside sitter was placed-with recommendations to pursue inpatient psychiatric admission.  Later patient was cleared by psychiatry, no indication for inpatient psych admission, social worker assisting with SNF placement, currently medically stable for discharge.     Assessment & Plan:  Principal Problem:   Pulmonary emboli (HCC) Active Problems:   Cocaine use disorder, severe, dependence (HCC)   Cocaine abuse with cocaine-induced mood disorder (HCC)   Homeless   Suicidal ideation   Essential hypertension   Bipolar I disorder (HCC)   Chronic combined systolic and diastolic congestive heart failure (HCC)    Right-sided pulmonary embolism with right lung infarct: Etiology likely due to noncompliance. Indefinite anticoagulation, on Eliquis.   Chronic HFrEF EF < 20%.  Class II Appears euvolemic.  Continue Coreg, Lasix, lisinopril Can be placed on outpatient Entresto if he remains compliant   Suicidal ideations: Initially had suicidal ideation and seen by psychiatry now he is cleared for discharge as he is no longer exhibiting suicidal or homicidal ideations Continue Abilify and Lexapro   Essential hypertension: Continue Norvasc, Coreg, Lasix and lisinopril.  IV as needed ordered   History  of bipolar disorder Continue Abilify 10 mg daily.   History of CVA with right hemiparesis/facial droop Continue Eliquis 5 mg twice daily   Peripheral neuropathy: Continue Neurontin 3 times daily, increased the dose to 600 mg today and as needed oxycodone.    DM-2 (A1c 6.0 on 9/17):  Continue CBGs with SSI.  Blood glucose in acceptable range   History of right AKA-left great toe amputation: Supportive care   Left medial malleoli ulcer Wound care as per wound care nurse recommendations.   COPD: Not in exacerbation Continue Brovana/Yupelri/budesonide-  will convert to inhaler regimen when closer to discharge.   Polysubstance abuse-cocaine Counseled.    Homelessness:  Difficult situation.  Claims he sleeps wherever he can-eats whatever he can get-claims he is on a wheelchair (right AKA/right hemiparesis).  Social worker following.   Plans for long-term institutional care without therapy follow-up   DVT prophylaxis: On Eliquis Code Status: Full code Family Communication:    Status is: Inpatient Difficult placement, TOC following      Subjective: Moving around with wheelchair without any issues.  No other complaints.  Examination: Constitutional: Not in acute distress Respiratory: Clear to auscultation bilaterally Cardiovascular: Normal sinus rhythm, no rubs Abdomen: Nontender nondistended good bowel sounds Musculoskeletal: Left great toe amputation, right-sided AKA Skin: No rashes seen Neurologic: CN 2-12 grossly intact.  And nonfocal Psychiatric: Normal judgment and insight. Alert and oriented x 3. Normal mood.   Objective: Vitals:   09/25/22 1936 09/25/22 2021 09/26/22 0523 09/26/22 0954  BP:  121/76 116/86 97/74  Pulse:  66 62 84  Resp:  18 18 18   Temp:  97.7 F (36.5 C) (!) 97.5 F (36.4 C) 97.6 F (36.4 C)  TempSrc:  Oral Oral Oral  SpO2: 98% 99%  100% 99%  Weight:      Height:        Intake/Output Summary (Last 24 hours) at 09/26/2022  1127 Last data filed at 09/26/2022 0900 Gross per 24 hour  Intake 800 ml  Output 120 ml  Net 680 ml   Filed Weights   08/16/22 1543 09/11/22 0502 09/17/22 2133  Weight: 86.2 kg 75.4 kg 75.4 kg     Data Reviewed:   CBC: Recent Labs  Lab 09/24/22 2343  WBC 6.6  HGB 14.0  HCT 41.3  MCV 88.1  PLT 272   Basic Metabolic Panel: Recent Labs  Lab 09/24/22 2343  NA 138  K 4.2  CL 106  CO2 20*  GLUCOSE 101*  BUN 21*  CREATININE 0.99  CALCIUM 9.4  MG 1.9   GFR: Estimated Creatinine Clearance: 85.3 mL/min (by C-G formula based on SCr of 0.99 mg/dL). Liver Function Tests: No results for input(s): "AST", "ALT", "ALKPHOS", "BILITOT", "PROT", "ALBUMIN" in the last 168 hours. No results for input(s): "LIPASE", "AMYLASE" in the last 168 hours. No results for input(s): "AMMONIA" in the last 168 hours. Coagulation Profile: No results for input(s): "INR", "PROTIME" in the last 168 hours. Cardiac Enzymes: No results for input(s): "CKTOTAL", "CKMB", "CKMBINDEX", "TROPONINI" in the last 168 hours. BNP (last 3 results) No results for input(s): "PROBNP" in the last 8760 hours. HbA1C: No results for input(s): "HGBA1C" in the last 72 hours. CBG: Recent Labs  Lab 09/25/22 0717 09/25/22 1136 09/25/22 1649 09/25/22 2020 09/26/22 0807  GLUCAP 114* 86 116* 126* 82   Lipid Profile: No results for input(s): "CHOL", "HDL", "LDLCALC", "TRIG", "CHOLHDL", "LDLDIRECT" in the last 72 hours. Thyroid Function Tests: No results for input(s): "TSH", "T4TOTAL", "FREET4", "T3FREE", "THYROIDAB" in the last 72 hours. Anemia Panel: No results for input(s): "VITAMINB12", "FOLATE", "FERRITIN", "TIBC", "IRON", "RETICCTPCT" in the last 72 hours. Sepsis Labs: No results for input(s): "PROCALCITON", "LATICACIDVEN" in the last 168 hours.  No results found for this or any previous visit (from the past 240 hour(s)).       Radiology Studies: No results found.      Scheduled Meds:  amLODipine   10 mg Oral Daily   apixaban  5 mg Oral BID   arformoterol  15 mcg Nebulization BID   ARIPiprazole  10 mg Oral Daily   budesonide (PULMICORT) nebulizer solution  0.25 mg Nebulization BID   carvedilol  3.125 mg Oral BID WC   escitalopram  5 mg Oral Daily   folic acid  1 mg Oral Daily   furosemide  40 mg Oral BID   gabapentin  600 mg Oral TID   insulin aspart  0-9 Units Subcutaneous TID WC   lisinopril  10 mg Oral Daily   revefenacin  175 mcg Nebulization Daily   thiamine  100 mg Oral Daily   Continuous Infusions:   LOS: 40 days   Time spent= 35 mins    Onesha Krebbs Arsenio Loader, MD [facility]  If 7PM-7AM, please contact night-coverage  09/26/2022, 11:27 AM

## 2022-09-27 LAB — GLUCOSE, CAPILLARY
Glucose-Capillary: 97 mg/dL (ref 70–99)
Glucose-Capillary: 97 mg/dL (ref 70–99)
Glucose-Capillary: 95 mg/dL (ref 70–99)
Glucose-Capillary: 127 mg/dL — ABNORMAL HIGH (ref 70–99)

## 2022-09-27 NOTE — Progress Notes (Signed)
PROGRESS NOTE    [name]  [id number] DOB: Dec 04, 1965 DOA: 08/16/2022 PCP: Marliss Coots, NP   Brief Narrative:  56 y.o.  male with history of chronic HFpEF, prior CVA with resultant right hemiparesis/facial droop, right AKA/left great toe amputation, VTE noncompliant with anticoagulation, DM-2, polysubstance abuse, bipolar disorder-who presented to the hospital with chest pain/shortness of breath-he was found to have bilateral PE with pulmonary infarct.  He was subsequently started on anticoagulation and admitted to the hospitalist service.  Limited echo showed EF of less than 20%.  Further hospital course was complicated by patient having suicidal ideations-psychiatry was consulted-bedside sitter was placed-with recommendations to pursue inpatient psychiatric admission.  Later patient was cleared by psychiatry, no indication for inpatient psych admission, social worker assisting with SNF placement, currently medically stable for discharge.     Assessment & Plan:  Principal Problem:   Pulmonary emboli (HCC) Active Problems:   Cocaine use disorder, severe, dependence (HCC)   Cocaine abuse with cocaine-induced mood disorder ([place])   Homeless   Suicidal ideation   Essential hypertension   Bipolar I disorder (HCC)   Chronic combined systolic and diastolic congestive heart failure (HCC)    Right-sided pulmonary embolism with right lung infarct: Etiology likely due to noncompliance. Indefinite anticoagulation, on Eliquis.   Chronic HFrEF EF < 20%.  Class II Appears euvolemic.  Continue Coreg, Lasix, lisinopril Can be placed on outpatient Entresto if he remains compliant   Suicidal ideations: Initially had suicidal ideation and seen by psychiatry now he is cleared for discharge as he is no longer exhibiting suicidal or homicidal ideations Continue Abilify and Lexapro   Essential hypertension: Continue Norvasc, Coreg, Lasix and lisinopril.  IV as needed ordered   History  of bipolar disorder Continue Abilify 10 mg daily.   History of CVA with right hemiparesis/facial droop Continue Eliquis 5 mg twice daily   Peripheral neuropathy: Continue Neurontin 3 times daily, increased the dose to 600 mg today and as needed oxycodone.    DM-2 (A1c 6.0 on 9/17):  Continue CBGs with SSI.  Blood glucose in acceptable range   History of right AKA-left great toe amputation: Supportive care   Left medial malleoli ulcer Wound care as per wound care nurse recommendations.   COPD: Not in exacerbation Continue Brovana/Yupelri/budesonide-  will convert to inhaler regimen when closer to discharge.   Polysubstance abuse-cocaine Counseled.    Homelessness:  Difficult situation.  Claims he sleeps wherever he can-eats whatever he can get-claims he is on a wheelchair (right AKA/right hemiparesis).  Social worker following.   Plans for long-term institutional care without therapy follow-up   DVT prophylaxis: On Eliquis Code Status: Full code Family Communication:    Status is: Inpatient Difficult placement, TOC following      Subjective: Seen and examined, no complaints.  Able to propel himself in the wheelchair  Examination: Constitutional: Not in acute distress Respiratory: Clear to auscultation bilaterally Cardiovascular: Normal sinus rhythm, no rubs Abdomen: Nontender nondistended good bowel sounds Musculoskeletal: Left great toe amputation, right-sided AKA Skin: No rashes seen Neurologic: CN 2-12 grossly intact.  And nonfocal Psychiatric: Normal judgment and insight. Alert and oriented x 3. Normal mood.   Objective: Vitals:   09/26/22 0954 09/26/22 1746 09/26/22 2127 09/27/22 0530  BP: 97/74 113/81 123/80 136/88  Pulse: 84 68 62 61  Resp: 18 19 18 18   Temp: 97.6 F (36.4 C) 97.9 F (36.6 C) 98.2 F (36.8 C) (!) 97.5 F (36.4 C)  TempSrc: Oral Oral  Oral  SpO2: 99% 100% 100% 100%  Weight:      Height:        Intake/Output Summary (Last  24 hours) at 09/27/2022 0759 Last data filed at 09/27/2022 0552 Gross per 24 hour  Intake 718 ml  Output 0 ml  Net 718 ml   Filed Weights   08/16/22 1543 09/11/22 0502 09/17/22 2133  Weight: 86.2 kg 75.4 kg 75.4 kg     Data Reviewed:   CBC: Recent Labs  Lab 09/24/22 2343  WBC 6.6  HGB 14.0  HCT 41.3  MCV 88.1  PLT 503   Basic Metabolic Panel: Recent Labs  Lab 09/24/22 2343  NA 138  K 4.2  CL 106  CO2 20*  GLUCOSE 101*  BUN 21*  CREATININE 0.99  CALCIUM 9.4  MG 1.9   GFR: Estimated Creatinine Clearance: 85.3 mL/min (by C-G formula based on SCr of 0.99 mg/dL). Liver Function Tests: No results for input(s): "AST", "ALT", "ALKPHOS", "BILITOT", "PROT", "ALBUMIN" in the last 168 hours. No results for input(s): "LIPASE", "AMYLASE" in the last 168 hours. No results for input(s): "AMMONIA" in the last 168 hours. Coagulation Profile: No results for input(s): "INR", "PROTIME" in the last 168 hours. Cardiac Enzymes: No results for input(s): "CKTOTAL", "CKMB", "CKMBINDEX", "TROPONINI" in the last 168 hours. BNP (last 3 results) No results for input(s): "PROBNP" in the last 8760 hours. HbA1C: No results for input(s): "HGBA1C" in the last 72 hours. CBG: Recent Labs  Lab 09/26/22 0807 09/26/22 1136 09/26/22 1621 09/26/22 2127 09/27/22 0716  GLUCAP 82 89 105* 138* 97   Lipid Profile: No results for input(s): "CHOL", "HDL", "LDLCALC", "TRIG", "CHOLHDL", "LDLDIRECT" in the last 72 hours. Thyroid Function Tests: No results for input(s): "TSH", "T4TOTAL", "FREET4", "T3FREE", "THYROIDAB" in the last 72 hours. Anemia Panel: No results for input(s): "VITAMINB12", "FOLATE", "FERRITIN", "TIBC", "IRON", "RETICCTPCT" in the last 72 hours. Sepsis Labs: No results for input(s): "PROCALCITON", "LATICACIDVEN" in the last 168 hours.  No results found for this or any previous visit (from the past 240 hour(s)).       Radiology Studies: No results  found.      Scheduled Meds:  amLODipine  10 mg Oral Daily   apixaban  5 mg Oral BID   arformoterol  15 mcg Nebulization BID   ARIPiprazole  10 mg Oral Daily   budesonide (PULMICORT) nebulizer solution  0.25 mg Nebulization BID   carvedilol  3.125 mg Oral BID WC   escitalopram  5 mg Oral Daily   folic acid  1 mg Oral Daily   furosemide  40 mg Oral BID   gabapentin  600 mg Oral TID   insulin aspart  0-9 Units Subcutaneous TID WC   lisinopril  10 mg Oral Daily   revefenacin  175 mcg Nebulization Daily   thiamine  100 mg Oral Daily   Continuous Infusions:   LOS: 41 days   Time spent= 35 mins    [name] Som Arsenio Loader, MD [facility]  If 7PM-7AM, please contact night-coverage  09/27/2022, 7:59 AM

## 2022-09-28 LAB — GLUCOSE, CAPILLARY
Glucose-Capillary: 144 mg/dL — ABNORMAL HIGH (ref 70–99)
Glucose-Capillary: 126 mg/dL — ABNORMAL HIGH (ref 70–99)
Glucose-Capillary: 132 mg/dL — ABNORMAL HIGH (ref 70–99)
Glucose-Capillary: 108 mg/dL — ABNORMAL HIGH (ref 70–99)

## 2022-09-28 NOTE — Progress Notes (Signed)
Mobility Specialist - Progress Note   09/28/22 1003  Mobility  Activity Transferred from bed to chair (self propelled)  Level of Assistance Standby assist, set-up cues, supervision of patient - no hands on  Assistive Device Wheelchair  Distance Ambulated (ft) 500 ft  Activity Response Tolerated well  Mobility Referral Yes  $Mobility charge 1 Mobility   Pt received in hall and agreeable. No complaints throughout. Pt was left at door with all needs met.      Mobility Specialist  

## 2022-09-28 NOTE — Progress Notes (Signed)
PROGRESS NOTE    [name]  TGG:[id number] DOB: 18-Nov-1966 DOA: 08/16/2022 PCP: Marliss Coots, NP   Brief Narrative:  56 y.o.  male with history of chronic HFpEF, prior CVA with resultant right hemiparesis/facial droop, right AKA/left great toe amputation, VTE noncompliant with anticoagulation, DM-2, polysubstance abuse, bipolar disorder-who presented to the hospital with chest pain/shortness of breath-he was found to have bilateral PE with pulmonary infarct.  He was subsequently started on anticoagulation and admitted to the hospitalist service.  Limited echo showed EF of less than 20%.  Further hospital course was complicated by patient having suicidal ideations-psychiatry was consulted-bedside sitter was placed-with recommendations to pursue inpatient psychiatric admission.  Later patient was cleared by psychiatry, no indication for inpatient psych admission, social worker assisting with SNF placement, currently medically stable for discharge.     Assessment & Plan:  Principal Problem:   Pulmonary emboli (HCC) Active Problems:   Cocaine use disorder, severe, dependence (HCC)   Cocaine abuse with cocaine-induced mood disorder ([place])   Homeless   Suicidal ideation   Essential hypertension   Bipolar I disorder (HCC)   Chronic combined systolic and diastolic congestive heart failure (HCC)    Right-sided pulmonary embolism with right lung infarct: Etiology likely due to noncompliance. Indefinite anticoagulation, on Eliquis.   Chronic HFrEF EF < 20%.  Class II Appears euvolemic.  Continue Coreg, Lasix, lisinopril Can be placed on outpatient Entresto if he remains compliant   Suicidal ideations: Initially had suicidal ideation and seen by psychiatry now he is cleared for discharge as he is no longer exhibiting suicidal or homicidal ideations Continue Abilify and Lexapro   Essential hypertension: Continue Norvasc, Coreg, Lasix and lisinopril.  IV as needed ordered   History  of bipolar disorder Continue Abilify 10 mg daily.   History of CVA with right hemiparesis/facial droop Continue Eliquis 5 mg twice daily   Peripheral neuropathy: Continue Neurontin 3 times daily, increased the dose to 600 mg today and as needed oxycodone.    DM-2 (A1c 6.0 on 9/17):  Continue CBGs with SSI.  Blood glucose in acceptable range   History of right AKA-left great toe amputation: Supportive care   Left medial malleoli ulcer Wound care as per wound care nurse recommendations.   COPD: Not in exacerbation Continue Brovana/Yupelri/budesonide-  will convert to inhaler regimen when closer to discharge.   Polysubstance abuse-cocaine Counseled.    Homelessness:  Difficult situation.  Claims he sleeps wherever he can-eats whatever he can get-claims he is on a wheelchair (right AKA/right hemiparesis).  Social worker following.   Plans for long-term institutional care without therapy follow-up   DVT prophylaxis: On Eliquis Code Status: Full code Family Communication:    Status is: Inpatient Difficult placement, TOC following      Subjective: Seen and examined at bedside, no complaints  Examination: Constitutional: Not in acute distress Respiratory: Clear to auscultation bilaterally Cardiovascular: Normal sinus rhythm, no rubs Abdomen: Nontender nondistended good bowel sounds Musculoskeletal: Left toe amputation, right-sided AKA Skin: No rashes seen Neurologic: CN 2-12 grossly intact.  And nonfocal Psychiatric: Normal judgment and insight. Alert and oriented x 3. Normal mood.     Objective: Vitals:   09/27/22 1717 09/27/22 2110 09/28/22 0500 09/28/22 0900  BP: 122/79 (!) 147/85 118/79 (!) 142/88  Pulse: 68 70 65 78  Resp:  18 18 18   Temp:  97.9 F (36.6 C) 98.4 F (36.9 C) 97.7 F (36.5 C)  TempSrc:  Oral Oral Oral  SpO2: 100% 99% 99% 97%  Weight:      Height:        Intake/Output Summary (Last 24 hours) at 09/28/2022 1026 Last data filed at  09/28/2022 0600 Gross per 24 hour  Intake 240 ml  Output 200 ml  Net 40 ml   Filed Weights   08/16/22 1543 09/11/22 0502 09/17/22 2133  Weight: 86.2 kg 75.4 kg 75.4 kg     Data Reviewed:   CBC: Recent Labs  Lab 09/24/22 2343  WBC 6.6  HGB 14.0  HCT 41.3  MCV 88.1  PLT 123456   Basic Metabolic Panel: Recent Labs  Lab 09/24/22 2343  NA 138  K 4.2  CL 106  CO2 20*  GLUCOSE 101*  BUN 21*  CREATININE 0.99  CALCIUM 9.4  MG 1.9   GFR: Estimated Creatinine Clearance: 85.3 mL/min (by C-G formula based on SCr of 0.99 mg/dL). Liver Function Tests: No results for input(s): "AST", "ALT", "ALKPHOS", "BILITOT", "PROT", "ALBUMIN" in the last 168 hours. No results for input(s): "LIPASE", "AMYLASE" in the last 168 hours. No results for input(s): "AMMONIA" in the last 168 hours. Coagulation Profile: No results for input(s): "INR", "PROTIME" in the last 168 hours. Cardiac Enzymes: No results for input(s): "CKTOTAL", "CKMB", "CKMBINDEX", "TROPONINI" in the last 168 hours. BNP (last 3 results) No results for input(s): "PROBNP" in the last 8760 hours. HbA1C: No results for input(s): "HGBA1C" in the last 72 hours. CBG: Recent Labs  Lab 09/27/22 0716 09/27/22 1141 09/27/22 1715 09/27/22 2111 09/28/22 0744  GLUCAP 97 97 95 127* 144*   Lipid Profile: No results for input(s): "CHOL", "HDL", "LDLCALC", "TRIG", "CHOLHDL", "LDLDIRECT" in the last 72 hours. Thyroid Function Tests: No results for input(s): "TSH", "T4TOTAL", "FREET4", "T3FREE", "THYROIDAB" in the last 72 hours. Anemia Panel: No results for input(s): "VITAMINB12", "FOLATE", "FERRITIN", "TIBC", "IRON", "RETICCTPCT" in the last 72 hours. Sepsis Labs: No results for input(s): "PROCALCITON", "LATICACIDVEN" in the last 168 hours.  No results found for this or any previous visit (from the past 240 hour(s)).       Radiology Studies: No results found.      Scheduled Meds:  amLODipine  10 mg Oral Daily    apixaban  5 mg Oral BID   arformoterol  15 mcg Nebulization BID   ARIPiprazole  10 mg Oral Daily   budesonide (PULMICORT) nebulizer solution  0.25 mg Nebulization BID   carvedilol  3.125 mg Oral BID WC   escitalopram  5 mg Oral Daily   folic acid  1 mg Oral Daily   furosemide  40 mg Oral BID   gabapentin  600 mg Oral TID   insulin aspart  0-9 Units Subcutaneous TID WC   lisinopril  10 mg Oral Daily   revefenacin  175 mcg Nebulization Daily   thiamine  100 mg Oral Daily   Continuous Infusions:   LOS: 42 days   Time spent= 35 mins    Ishita Mcnerney Arsenio Loader, MD [facility]  If 7PM-7AM, please contact night-coverage  09/28/2022, 10:26 AM

## 2022-09-29 LAB — GLUCOSE, CAPILLARY
Glucose-Capillary: 141 mg/dL — ABNORMAL HIGH (ref 70–99)
Glucose-Capillary: 88 mg/dL (ref 70–99)
Glucose-Capillary: 83 mg/dL (ref 70–99)
Glucose-Capillary: 123 mg/dL — ABNORMAL HIGH (ref 70–99)

## 2022-09-29 NOTE — TOC Progression Note (Signed)
Transition of Care [facility]) - Initial/Assessment Note    Patient Details  Name: [name] MRN: [id number] Date of Birth: 1966-09-23  Transition of Care [facility]) CM/SW Contact:    Milinda Antis, LCSWA Phone Number: 09/29/2022, 3:52 PM  Clinical Narrative:                 [name] contacted Christine with [facility] (SNF) and [name] (SNF to inquire about when the bedside assessment would be completed.  [name] informed that a definite day and time cannot be given, but once assessment is complete [name] will be informed of the determination.    Expected Discharge Plan: Skilled Nursing Facility Barriers to Discharge: [place] (PASRR), Insurance Authorization, SNF Pending bed offer, Active Substance Use - Placement, Homeless with medical needs   Patient Goals and CMS Choice Patient states their goals for this hospitalization and ongoing recovery are:: Rehab CMS Medicare.gov Compare Post Acute Care list provided to:: Patient Choice offered to / list presented to : Patient  Expected Discharge Plan and Services Expected Discharge Plan: [place] In-house Referral: Clinical Social Work   Post Acute Care Choice: [place] Living arrangements for the past 2 months: Homeless                                      Prior Living Arrangements/Services Living arrangements for the past 2 months: Homeless Lives with:: Self Patient language and need for interpreter reviewed:: Yes Do you feel safe going back to the place where you live?: Yes      Need for Family Participation in Patient Care: No (Comment) Care giver support system in place?: No (comment) Current home services: DME Administrator, Civil Service) Criminal Activity/Legal Involvement Pertinent to Current Situation/Hospitalization: No - Comment as needed  Activities of Daily Living      Permission Sought/Granted                  Emotional Assessment Appearance:: Appears stated age      Orientation: : Oriented to Self, Oriented to Place, Oriented to  Time, Oriented to Situation Alcohol / Substance Use: Illicit Drugs Psych Involvement: Yes (comment)  Admission diagnosis:  Pulmonary emboli (HCC) [I26.99] Acute pulmonary embolism, unspecified pulmonary embolism type, unspecified whether acute cor pulmonale present (HCC) [I26.99] Patient Active Problem List   Diagnosis Date Noted   Pulmonary emboli ([place]) 08/17/2022   Venous ulcer ([place]) 07/23/2022   Sinus bradycardia 07/23/2022   Anemia of chronic disease 07/23/2022   S/P AKA (above knee amputation), right ([place]) 07/23/2022   Acute on chronic systolic congestive heart failure ([place]) 07/12/2022   History of DVT (deep vein thrombosis) 07/12/2022   CAP (community acquired pneumonia) 07/12/2022   Chronic venous hypertension (idiopathic) with ulcer and inflammation of bilateral lower extremity ([place])    Sepsis due to cellulitis ([place]) 04/04/2022   Normocytic anemia 04/04/2022   Acute deep vein thrombosis (DVT) of brachial vein ([place]) 03/31/2022   Swelling of lower extremity 03/23/2022   SIRS (systemic inflammatory response syndrome) ([place]) 03/23/2022   Leg swelling 03/23/2022   Acute on chronic systolic heart failure ([place]) 02/26/2022   Seizure ([place]) 02/26/2022   Venous stasis ulcer of right ankle limited to breakdown of skin with varicose veins ([place])    Hypertensive urgency 01/29/2022   Prolonged QT interval 01/29/2022   Alcohol abuse 01/29/2022   chronic ulcers 01/29/2022   Wound infection 12/04/2021  Open wounds involving multiple regions of lower extremity 12/03/2021   Mixed diabetic hyperlipidemia associated with type 2 diabetes mellitus (HCC) 12/03/2021   Cellulitis of right lower extremity 11/13/2021   Type 2 diabetes mellitus with diabetic polyneuropathy, without long-term current use of insulin (HCC) 11/13/2021   Antisocial personality disorder (HCC) 11/04/2021   Malingering 11/04/2021   Foot pain 04/23/2021    Chronic combined systolic and diastolic congestive heart failure (HCC) 03/02/2021   History of stroke 03/02/2021   Polysubstance abuse (HCC) 03/02/2021   Skin ulcer of right great toe with necrosis of bone (HCC)    Encounter for assessment of healthcare decision-making capacity    Pain, dental    Dystonia    Dysarthria    Shock liver 01/04/2021   Acute metabolic encephalopathy 01/04/2021   Coronary artery disease involving native coronary artery of native heart without angina pectoris 01/04/2021   Essential hypertension 01/04/2021   Bipolar I disorder (HCC) 01/04/2021   Cocaine abuse (HCC) 01/04/2021   Homelessness 01/04/2021   HLD (hyperlipidemia) 01/04/2021   Osteomyelitis of great toe (HCC)    Ulcer of great toe, left, with necrosis of bone (HCC)    Ulcer of great toe, right, with necrosis of muscle (HCC)    Acute osteomyelitis of toe, left (HCC)    Acute on chronic combined systolic and diastolic CHF (congestive heart failure) (HCC) 12/20/2020   Respiratory failure (HCC)    Ischemic stroke (HCC) 12/17/2020   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   Diabetic foot ulcer (HCC) 12/08/2020   CHF (congestive heart failure) (HCC) 11/14/2020   CHF exacerbation (HCC) 11/13/2020   Hypokalemia    Hyperlipidemia    Acute hypoxemic respiratory failure (HCC) 10/26/2020   COPD with acute exacerbation (HCC) 10/14/2020   Acute on chronic combined systolic and diastolic CHF (congestive heart failure) (HCC) 10/02/2020   Heart failure with reduced ejection fraction and diastolic dysfunction (HCC) 09/07/2020   Near syncope 07/21/2020   Acid reflux 06/03/2020   Cannabis abuse 03/19/2020   Acute bronchitis due to Rhinovirus 09/08/2019   Schizoaffective disorder (HCC) 06/24/2019   SOB (shortness of breath)    QT prolongation 05/25/2019   Cocaine abuse (HCC)    Elevated troponin    Homeless    Suicidal ideation    Bilateral lower extremity pain 04/05/2019   Housing situation unstable 03/28/2019    Bilateral lower extremity edema    Acute exacerbation of CHF (congestive heart failure) (HCC) 03/04/2019   Alcohol withdrawal syndrome, with delirium (HCC) 05/03/2018   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   Mild intermittent asthma without complication 08/28/2017   Asthma 12/26/2016   Tobacco use disorder 12/26/2016   Schizophrenia (HCC) 12/26/2016   Severe episode of recurrent major depressive disorder, with psychotic features (HCC) 10/31/2016   Cocaine abuse with cocaine-induced mood disorder (HCC) 09/06/2016   Alcohol use disorder, mild, abuse 07/31/2016   Substance induced mood disorder (HCC) 07/30/2016   Schizoaffective disorder, bipolar type (HCC) 05/31/2016   Cocaine use disorder, severe, dependence (HCC) 05/31/2016   Major depressive disorder, recurrent episode (HCC) 05/30/2016   Suicidal behavior with attempted self-injury (HCC) 05/08/2016   Acetaminophen overdose 05/06/2016   PCP:  [name], NP Pharmacy:   [facility] - [place], [name] - [place] [address] [name] [address] Phone: [phone] Fax: [phone]  [facility] [address] [name] [address] Phone: [phone] Fax: [phone]     Social  Determinants of Health (SDOH) Interventions    Readmission Risk Interventions    08/18/2022    9:00 AM 07/18/2022    4:11 PM 04/08/2022    1:43 PM  Readmission Risk Prevention Plan  Transportation Screening Complete Complete Complete  Medication Review Oceanographer) Complete Complete Complete  PCP or Specialist appointment within 3-5 days of discharge Complete Complete Complete  HRI or Home Care Consult Complete Complete Not Complete  HRI or Home Care Consult Pt Refusal Comments   Pt is homeless  SW Recovery Care/Counseling Consult Complete Complete Complete  Palliative Care Screening Not Applicable  Not Applicable  Skilled Nursing Facility  Not Applicable Complete Not Applicable

## 2022-09-29 NOTE — Progress Notes (Signed)
Mobility Specialist Progress Note:   09/29/22 0900  Mobility  Activity Transferred from bed to chair  Level of Assistance Modified independent, requires aide device or extra time  Assistive Device Wheelchair  Distance Ambulated (ft) 500 ft  Activity Response Tolerated well  Mobility Referral Yes  $Mobility charge 1 Mobility   Pt eager for mobility session. No physical assistance required throughout. Pt c/o residual limb pain upon transferring. Left self-propelling w/c in hallway.   [place] Numbers Acute Rehab Secure Chat or Office Phone: [phone]

## 2022-09-29 NOTE — Progress Notes (Signed)
PROGRESS NOTE    [name]  WGN:[id number] DOB: [birth year] DOA: 08/16/2022 PCP: [name], NP   Brief Narrative:  56 y.o.  male with history of chronic HFpEF, prior CVA with resultant right hemiparesis/facial droop, right AKA/left great toe amputation, VTE noncompliant with anticoagulation, DM-2, polysubstance abuse, bipolar disorder-who presented to the hospital with chest pain/shortness of breath-he was found to have bilateral PE with pulmonary infarct.  He was subsequently started on anticoagulation and admitted to the hospitalist service.  Limited echo showed EF of less than 20%.  Further hospital course was complicated by patient having suicidal ideations-psychiatry was consulted-bedside sitter was placed-with recommendations to pursue inpatient psychiatric admission.  Later patient was cleared by psychiatry, no indication for inpatient psych admission, social worker assisting with SNF placement, currently medically stable for discharge.     Assessment & Plan:  Principal Problem:   Pulmonary emboli (HCC) Active Problems:   Cocaine use disorder, severe, dependence (HCC)   Cocaine abuse with cocaine-induced mood disorder (HCC)   Homeless   Suicidal ideation   Essential hypertension   Bipolar I disorder (HCC)   Chronic combined systolic and diastolic congestive heart failure (HCC)    Right-sided pulmonary embolism with right lung infarct: Etiology likely due to noncompliance. Indefinite anticoagulation, on Eliquis   Chronic HFrEF EF < 20%.  Class II Appears euvolemic.  Continue Coreg, Lasix, lisinopril Can be placed on outpatient Entresto if he remains compliant   Suicidal ideations: Initially had suicidal ideation and seen by psychiatry now he is cleared for discharge as he is no longer exhibiting suicidal or homicidal ideations Continue Abilify and Lexapro   Essential hypertension: Continue Norvasc, Coreg, Lasix and lisinopril.  IV as needed ordered   History of  bipolar disorder Continue Abilify 10 mg daily.   History of CVA with right hemiparesis/facial droop Continue Eliquis 5 mg twice daily   Peripheral neuropathy: Continue Neurontin 3 times daily, increased the dose to 600 mg today and as needed oxycodone.    DM-2 (A1c 6.0 on 9/17):  Continue CBGs with SSI.  Blood glucose in acceptable range   History of right AKA-left great toe amputation: Supportive care   Left medial malleoli ulcer Wound care as per wound care nurse recommendations.   COPD: Not in exacerbation Continue Brovana/Yupelri/budesonide-  will convert to inhaler regimen when closer to discharge.   Polysubstance abuse-cocaine Counseled.    Homelessness:  Difficult situation.  Claims he sleeps wherever he can-eats whatever he can get-claims he is on a wheelchair (right AKA/right hemiparesis).  Social worker following.     DVT prophylaxis: On Eliquis Code Status: Full code Family Communication:    Status is: Inpatient TOC working on placement  Subjective: No complaints, no acute events  Examination: Constitutional: Not in acute distress Respiratory: Clear to auscultation bilaterally Cardiovascular: Normal sinus rhythm, no rubs Abdomen: Nontender nondistended good bowel sounds Musculoskeletal: Left toe amputation, right-sided AKA Skin: No rashes seen Neurologic: CN 2-12 grossly intact.  And nonfocal Psychiatric: Normal judgment and insight. Alert and oriented x 3. Normal mood.     Objective: Vitals:   09/28/22 2132 09/29/22 0529 09/29/22 0807 09/29/22 1003  BP: 134/85 (!) 137/94  110/85  Pulse: 74 62  84  Resp: 18 18  18   Temp: (!) 97.5 F (36.4 C) (!) 97.5 F (36.4 C)  98.1 F (36.7 C)  TempSrc: Oral Oral  Oral  SpO2: 95%  98% 100%  Weight:      Height:  Intake/Output Summary (Last 24 hours) at 09/29/2022 1055 Last data filed at 09/29/2022 0529 Gross per 24 hour  Intake 0 ml  Output 0 ml  Net 0 ml   Filed Weights   08/16/22 1543  09/11/22 0502 09/17/22 2133  Weight: 86.2 kg 75.4 kg 75.4 kg     Data Reviewed:   CBC: Recent Labs  Lab 09/24/22 2343  WBC 6.6  HGB 14.0  HCT 41.3  MCV 88.1  PLT 025   Basic Metabolic Panel: Recent Labs  Lab 09/24/22 2343  NA 138  K 4.2  CL 106  CO2 20*  GLUCOSE 101*  BUN 21*  CREATININE 0.99  CALCIUM 9.4  MG 1.9   GFR: Estimated Creatinine Clearance: 85.3 mL/min (by C-G formula based on SCr of 0.99 mg/dL). Liver Function Tests: No results for input(s): "AST", "ALT", "ALKPHOS", "BILITOT", "PROT", "ALBUMIN" in the last 168 hours. No results for input(s): "LIPASE", "AMYLASE" in the last 168 hours. No results for input(s): "AMMONIA" in the last 168 hours. Coagulation Profile: No results for input(s): "INR", "PROTIME" in the last 168 hours. Cardiac Enzymes: No results for input(s): "CKTOTAL", "CKMB", "CKMBINDEX", "TROPONINI" in the last 168 hours. BNP (last 3 results) No results for input(s): "PROBNP" in the last 8760 hours. HbA1C: No results for input(s): "HGBA1C" in the last 72 hours. CBG: Recent Labs  Lab 09/28/22 0744 09/28/22 1118 09/28/22 1528 09/28/22 2132 09/29/22 0738  GLUCAP 144* 126* 132* 108* 141*   Lipid Profile: No results for input(s): "CHOL", "HDL", "LDLCALC", "TRIG", "CHOLHDL", "LDLDIRECT" in the last 72 hours. Thyroid Function Tests: No results for input(s): "TSH", "T4TOTAL", "FREET4", "T3FREE", "THYROIDAB" in the last 72 hours. Anemia Panel: No results for input(s): "VITAMINB12", "FOLATE", "FERRITIN", "TIBC", "IRON", "RETICCTPCT" in the last 72 hours. Sepsis Labs: No results for input(s): "PROCALCITON", "LATICACIDVEN" in the last 168 hours.  No results found for this or any previous visit (from the past 240 hour(s)).       Radiology Studies: No results found.      Scheduled Meds:  amLODipine  10 mg Oral Daily   apixaban  5 mg Oral BID   arformoterol  15 mcg Nebulization BID   ARIPiprazole  10 mg Oral Daily   budesonide  (PULMICORT) nebulizer solution  0.25 mg Nebulization BID   carvedilol  3.125 mg Oral BID WC   escitalopram  5 mg Oral Daily   folic acid  1 mg Oral Daily   furosemide  40 mg Oral BID   gabapentin  600 mg Oral TID   insulin aspart  0-9 Units Subcutaneous TID WC   lisinopril  10 mg Oral Daily   revefenacin  175 mcg Nebulization Daily   thiamine  100 mg Oral Daily   Continuous Infusions:   LOS: 43 days   Time spent= 35 mins    Jacobi Ryant Arsenio Loader, MD [facility]  If 7PM-7AM, please contact night-coverage  09/29/2022, 10:55 AM

## 2022-09-30 LAB — GLUCOSE, CAPILLARY
Glucose-Capillary: 159 mg/dL — ABNORMAL HIGH (ref 70–99)
Glucose-Capillary: 120 mg/dL — ABNORMAL HIGH (ref 70–99)
Glucose-Capillary: 98 mg/dL (ref 70–99)
Glucose-Capillary: 108 mg/dL — ABNORMAL HIGH (ref 70–99)
Glucose-Capillary: 168 mg/dL — ABNORMAL HIGH (ref 70–99)

## 2022-09-30 NOTE — Progress Notes (Signed)
PROGRESS NOTE    [name]  [id number] DOB: 05/12/1966 DOA: 08/16/2022 PCP: Marliss Coots, NP   Brief Narrative:  56 y.o.  male with history of chronic HFpEF, prior CVA with resultant right hemiparesis/facial droop, right AKA/left great toe amputation, VTE noncompliant with anticoagulation, DM-2, polysubstance abuse, bipolar disorder-who presented to the hospital with chest pain/shortness of breath-he was found to have bilateral PE with pulmonary infarct.  He was subsequently started on anticoagulation and admitted to the hospitalist service.  Limited echo showed EF of less than 20%.  Further hospital course was complicated by patient having suicidal ideations-psychiatry was consulted-bedside sitter was placed-with recommendations to pursue inpatient psychiatric admission.  Later patient was cleared by psychiatry, no indication for inpatient psych admission, social worker assisting with SNF placement, medically remained stable     Assessment & Plan:  Principal Problem:   Pulmonary emboli ([place]) Active Problems:   Cocaine use disorder, severe, dependence ([place])   Cocaine abuse with cocaine-induced mood disorder ([place])   Homeless   Suicidal ideation   Essential hypertension   Bipolar I disorder ([place])   Chronic combined systolic and diastolic congestive heart failure ([place])    Right-sided pulmonary embolism with right lung infarct: Etiology likely due to noncompliance. Indefinite anticoagulation, on Eliquis   Chronic HFrEF EF < 20%.  Class II Appears euvolemic.  Continue Coreg, Lasix, lisinopril Can eventually be placed on Entresto outpatient   Suicidal ideations: Initially had suicidal ideation and seen by psychiatry now he is cleared for discharge as he is no longer exhibiting suicidal or homicidal ideations Continue Abilify and Lexapro   Essential hypertension: Continue Norvasc, Coreg, Lasix and lisinopril.  IV as needed ordered   History of bipolar disorder Continue  Abilify 10 mg daily.   History of CVA with right hemiparesis/facial droop Continue Eliquis 5 mg twice daily   Peripheral neuropathy: Continue Neurontin 3 times daily, increase to 600 mg.  Continue as needed oxycodone   DM-2 (A1c 6.0 on 9/17):  Continue CBGs with SSI.  Blood glucose in acceptable range   History of right AKA-left great toe amputation: Supportive care   Left medial malleoli ulcer Wound care as per wound care nurse recommendations.   COPD: Not in exacerbation Continue Brovana/Yupelri/budesonide-  will convert to inhaler regimen when closer to discharge.   Polysubstance abuse-cocaine Counseled.    Homelessness:  Difficult situation.  Claims he sleeps wherever he can-eats whatever he can get-claims he is on a wheelchair (right AKA/right hemiparesis).  Social worker following.     DVT prophylaxis: On Eliquis Code Status: Full code Family Communication:    Status is: Inpatient TOC working on placement  Subjective: No complaints doing okay  Examination: Constitutional: Not in acute distress Respiratory: Clear to auscultation bilaterally Cardiovascular: Normal sinus rhythm, no rubs Abdomen: Nontender nondistended good bowel sounds Musculoskeletal: Left toe amputation, right-sided AKA Skin: No rashes seen Neurologic: CN 2-12 grossly intact.  And nonfocal Psychiatric: Normal judgment and insight. Alert and oriented x 3. Normal mood.   Objective: Vitals:   09/29/22 1003 09/29/22 1842 09/29/22 2120 09/30/22 0535  BP: 110/85 (!) 142/95 127/80 133/86  Pulse: 84 77 77 69  Resp: 18  18 18   Temp: 98.1 F (36.7 C)  98.4 F (36.9 C)   TempSrc: Oral  Oral   SpO2: 100% 99% 98% 97%  Weight:      Height:        Intake/Output Summary (Last 24 hours) at 09/30/2022 [id number] Last data filed at 09/30/2022 0200  Gross per 24 hour  Intake 0 ml  Output 0 ml  Net 0 ml   Filed Weights   08/16/22 1543 09/11/22 0502 09/17/22 2133  Weight: 86.2 kg 75.4 kg 75.4 kg      Data Reviewed:   CBC: Recent Labs  Lab 09/24/22 2343  WBC 6.6  HGB 14.0  HCT 41.3  MCV 88.1  PLT 324   Basic Metabolic Panel: Recent Labs  Lab 09/24/22 2343  NA 138  K 4.2  CL 106  CO2 20*  GLUCOSE 101*  BUN 21*  CREATININE 0.99  CALCIUM 9.4  MG 1.9   GFR: Estimated Creatinine Clearance: 85.3 mL/min (by C-G formula based on SCr of 0.99 mg/dL). Liver Function Tests: No results for input(s): "AST", "ALT", "ALKPHOS", "BILITOT", "PROT", "ALBUMIN" in the last 168 hours. No results for input(s): "LIPASE", "AMYLASE" in the last 168 hours. No results for input(s): "AMMONIA" in the last 168 hours. Coagulation Profile: No results for input(s): "INR", "PROTIME" in the last 168 hours. Cardiac Enzymes: No results for input(s): "CKTOTAL", "CKMB", "CKMBINDEX", "TROPONINI" in the last 168 hours. BNP (last 3 results) No results for input(s): "PROBNP" in the last 8760 hours. HbA1C: No results for input(s): "HGBA1C" in the last 72 hours. CBG: Recent Labs  Lab 09/29/22 1059 09/29/22 1530 09/29/22 2121 09/30/22 0543 09/30/22 0732  GLUCAP 88 83 123* 159* 120*   Lipid Profile: No results for input(s): "CHOL", "HDL", "LDLCALC", "TRIG", "CHOLHDL", "LDLDIRECT" in the last 72 hours. Thyroid Function Tests: No results for input(s): "TSH", "T4TOTAL", "FREET4", "T3FREE", "THYROIDAB" in the last 72 hours. Anemia Panel: No results for input(s): "VITAMINB12", "FOLATE", "FERRITIN", "TIBC", "IRON", "RETICCTPCT" in the last 72 hours. Sepsis Labs: No results for input(s): "PROCALCITON", "LATICACIDVEN" in the last 168 hours.  No results found for this or any previous visit (from the past 240 hour(s)).       Radiology Studies: No results found.      Scheduled Meds:  amLODipine  10 mg Oral Daily   apixaban  5 mg Oral BID   arformoterol  15 mcg Nebulization BID   ARIPiprazole  10 mg Oral Daily   budesonide (PULMICORT) nebulizer solution  0.25 mg Nebulization BID    carvedilol  3.125 mg Oral BID WC   escitalopram  5 mg Oral Daily   folic acid  1 mg Oral Daily   furosemide  40 mg Oral BID   gabapentin  600 mg Oral TID   insulin aspart  0-9 Units Subcutaneous TID WC   lisinopril  10 mg Oral Daily   revefenacin  175 mcg Nebulization Daily   thiamine  100 mg Oral Daily   Continuous Infusions:   LOS: 44 days   Time spent= 35 mins    Lucien Budney Arsenio Loader, MD [facility]  If 7PM-7AM, please contact night-coverage  09/30/2022, 8:12 AM

## 2022-09-30 NOTE — Progress Notes (Signed)
Physical Therapy Treatment Patient Details Name: [name] MRN: [id number] DOB: 08/16/1966 Today's Date: 09/30/2022   History of Present Illness Pt is a 56yo M who presented 9/16 with c/o chest pain, SOB, and leg swelling. Found to have B PE. PMH includes asthma, bioplar 1, depression, schizoaffective disorder, CVA, chronic CHF, homelessness, HTN, DM2, cocaine and cannabis use and R AKA.    PT Comments    Pt functioning at mod I level for transfer- and wheelchair-level mobility at this time. PT discussed pt goals with him, he declines desire for gait training without RLE prosthesis and understands he is independent with current level of mobility. PT to sign off, PT encourages mobility specialists to continue to follow. All PT acute care goals met.      Recommendations for follow up therapy are one component of a multi-disciplinary discharge planning process, led by the attending physician.  Recommendations may be updated based on patient status, additional functional criteria and insurance authorization.  Follow Up Recommendations  Long-term institutional care without follow-up therapy Can patient physically be transported by private vehicle: Yes   Assistance Recommended at Discharge PRN  Patient can return home with the following A lot of help with bathing/dressing/bathroom   Equipment Recommendations  Wheelchair (measurements PT);Wheelchair cushion (measurements PT)    Recommendations for Other Services       Precautions / Restrictions Precautions Precautions: Fall;Other (comment) Precaution Comments: hx of R AKA w/o prosthetic Restrictions Weight Bearing Restrictions: No     Mobility  Bed Mobility Overal bed mobility: Independent                  Transfers Overall transfer level: Independent           Squat pivot transfers: Modified independent (Device/Increase time)     General transfer comment: increased time, but pt able to set up w/c, lock w/c,  and transfer to it without PT assist or cues    Ambulation/Gait                   Theme park manager mobility: Yes Wheelchair propulsion: Both upper extremities Wheelchair parts: Independent Distance: 250 Wheelchair Assistance Details (indicate cue type and reason): no physical assist, intermittent cues for increasing speed and using LLE to assist in propulsion and steering  Modified Rankin (Stroke Patients Only)       Balance   Sitting-balance support: No upper extremity supported Sitting balance-Leahy Scale: Good       Standing balance-Leahy Scale: Poor Standing balance comment: but mod I for transfer level                            Cognition Arousal/Alertness: Awake/alert Behavior During Therapy: WFL for tasks assessed/performed Overall Cognitive Status: Within Functional Limits for tasks assessed                                 General Comments: WFL for simple mobility tasks        Exercises Other Exercises Other Exercises: w/c pushups x5 Other Exercises: LAQs LLE x15 Other Exercises: seated marches LLE x15    General Comments        Pertinent Vitals/Pain Pain Assessment Pain Assessment: No/denies pain Pain Intervention(s): Monitored during session    Home Living  Prior Function            PT Goals (current goals can now be found in the care plan section) Acute Rehab PT Goals Patient Stated Goal: to find out where I'm going PT Goal Formulation: With patient Time For Goal Achievement: 09/30/22 Potential to Achieve Goals: Fair Progress towards PT goals: Goals met/education completed, patient discharged from PT    Frequency    Min 2X/week      PT Plan Current plan remains appropriate    Co-evaluation              AM-PAC PT "6 Clicks" Mobility   Outcome Measure  Help needed turning from your back to  your side while in a flat bed without using bedrails?: None Help needed moving from lying on your back to sitting on the side of a flat bed without using bedrails?: None Help needed moving to and from a bed to a chair (including a wheelchair)?: None Help needed standing up from a chair using your arms (e.g., wheelchair or bedside chair)?: None Help needed to walk in hospital room?: Total Help needed climbing 3-5 steps with a railing? : Total 6 Click Score: 18    End of Session   Activity Tolerance: Patient tolerated treatment well Patient left: in chair;Other (comment) (in w/c. anna mobility specialist assumed pt care in hallway) Nurse Communication: Mobility status PT Visit Diagnosis: Other abnormalities of gait and mobility (R26.89)     Time: 2637-8588 PT Time Calculation (min) (ACUTE ONLY): 28 min  Charges:  $Therapeutic Activity: 8-22 mins $Wheel Chair Management: 8-22 mins                     Stacie Glaze, PT DPT Acute Rehabilitation Services Pager [phone]  Office [phone]   Roxine Caddy E Ruffin Pyo 09/30/2022, 12:17 PM

## 2022-09-30 NOTE — Progress Notes (Signed)
Mobility Specialist Progress Note:   09/30/22 1200  Mobility  Activity  (self-propelled in w/c)  Level of Assistance Modified independent, requires aide device or extra time  Assistive Device Wheelchair  Distance Ambulated (ft) 500 ft (self-propel)  Activity Response Tolerated well  Mobility Referral Yes  $Mobility charge 1 Mobility   Pt received self-propelling in hallway post-PT session. No physical assistance throughout. Pt back in room with all needs met.   Nelta Numbers Acute Rehab Secure Chat or Office Phone: [phone]

## 2022-10-01 LAB — GLUCOSE, CAPILLARY
Glucose-Capillary: 96 mg/dL (ref 70–99)
Glucose-Capillary: 86 mg/dL (ref 70–99)
Glucose-Capillary: 97 mg/dL (ref 70–99)
Glucose-Capillary: 124 mg/dL — ABNORMAL HIGH (ref 70–99)

## 2022-10-01 NOTE — Progress Notes (Signed)
Mobility Specialist Progress Note:   10/01/22 1400  Mobility  Activity Transferred from bed to chair (self-propel in w/c)  Level of Assistance Modified independent, requires aide device or extra time  Assistive Device Wheelchair  Activity Response Tolerated well  Mobility Referral Yes  $Mobility charge 1 Mobility   Pt eager for mobility session. Transferred to w/c independently, then able to self-propel in hallway. Pt back in room with all needs met.  Nelta Numbers Acute Rehab Secure Chat or Office Phone: [phone]

## 2022-10-01 NOTE — Progress Notes (Signed)
PROGRESS NOTE    [name]  ZJQ:[id number] DOB: [birth year] DOA: 08/16/2022 PCP: Marliss Coots, NP   Brief Narrative:   56 y.o.  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  He was subsequently started on anticoagulation and admitted to the hospitalist service.  Limited echo showed EF of less than 20%.  Further hospital course was complicated by patient having suicidal ideation-psychiatry was consulted-bedside sitter was placed-with recommendations to pursue inpatient psychiatric admission.  Later, patient was cleared by psychiatry, no indication for inpatient psych admission, social worker assisting with SNF placement. Currently medically stable for discharge.  Assessment & Plan:  Pulmonary embolism with right lung infarct:  Etiology likely due to noncompliance Indefinite anticoagulation-given prior episode of VTE.  Continue Eliquis   Chronic HFpEF/HFrEF:  Euvolemic-continue Lasix/Coreg/lisinopril.  Continue strict input and output, daily weights, fluid restriction. Outpatient cardiology follow-up-if he is compliant with medication-could be switched to Entresto    Suicidal ideation: Appreciate psych input - now stable, cleared by psych.  Outpatient follow-up with psychiatry.  Continue escitalopram, aripiprazole   HTN  Stable-continue amlodipine/Coreg/lisinopril/Lasix   History of bipolar disorder Continue Abilify.    History of CVA with right hemiparesis/facial droop On anticoagulation   Peripheral neuropathy: Continue Neurontin and as needed oxycodone.    DM-2 (A1c 6.0 on 9/17):  Continue CBGs with SSI   History of right AKA-left great toe amputation   Left medial malleoli  ulcer Wound care as per wound care nurse recommendations.   COPD Not in exacerbation-continue Brovana/Yupelri/budesonide- will convert to inhaler regimen when closer to discharge.   Polysubstance abuse-cocaine Counseled   Homelessness:  Very vague about his situation-claims he sleeps wherever he can-eats whatever he can get-claims he is on a wheelchair (right AKA/right hemiparesis).   Social worker following.  DVT prophylaxis: Eliquis Code Status: Full Family Communication: None at bedside Disposition Plan: Status is: Inpatient Remains inpatient appropriate because: Awaiting safe disposition.  Currently medically stable for discharge.    Consultants: Psychiatry  Procedures: None  Antimicrobials: None currently   Subjective: Patient seen and examined at bedside.  Poor historian.  No fever, worsening shortness of breath or vomiting reported.   Objective: Vitals:   09/30/22 1917 09/30/22 2043 10/01/22 0558 10/01/22 0810  BP:  (!) 135/97 (!) 167/118 132/89  Pulse:  81 69 74  Resp:  18 18 16   Temp:  98.7 F (37.1 C) (!) 97.5 F (36.4 C) 97.7 F (36.5 C)  TempSrc:  Oral Oral Oral  SpO2: 98% 95% 99% 100%  Weight:      Height:        Intake/Output Summary (Last 24 hours) at 10/01/2022 0930 Last data filed at 10/01/2022 0820 Gross per 24 hour  Intake 930 ml  Output --  Net 930 ml    Filed Weights   08/16/22 1543 09/11/22 0502 09/17/22 2133  Weight: 86.2 kg 75.4 kg 75.4 kg    Examination:  General exam: On room air.  No distress.  Poor historian.  Slow to respond.  Flat affect. Respiratory system: Decreased breath sounds at bases bilaterally cardiovascular system: Mostly rate controlled; S1-S2 heard gastrointestinal system: Abdomen is distended; soft and nontender.  Normal bowel sounds heard  extremities: Right AKA present.  Left great toe amputation present   Data  Reviewed: I have personally reviewed following labs and imaging studies  CBC: Recent Labs  Lab  09/24/22 2343  WBC 6.6  HGB 14.0  HCT 41.3  MCV 88.1  PLT 123456   Basic Metabolic Panel: Recent Labs  Lab 09/24/22 2343  NA 138  K 4.2  CL 106  CO2 20*  GLUCOSE 101*  BUN 21*  CREATININE 0.99  CALCIUM 9.4  MG 1.9   GFR: Estimated Creatinine Clearance: 85.3 mL/min (by C-G formula based on SCr of 0.99 mg/dL). Liver Function Tests: No results for input(s): "AST", "ALT", "ALKPHOS", "BILITOT", "PROT", "ALBUMIN" in the last 168 hours. No results for input(s): "LIPASE", "AMYLASE" in the last 168 hours. No results for input(s): "AMMONIA" in the last 168 hours. Coagulation Profile: No results for input(s): "INR", "PROTIME" in the last 168 hours. Cardiac Enzymes: No results for input(s): "CKTOTAL", "CKMB", "CKMBINDEX", "TROPONINI" in the last 168 hours. BNP (last 3 results) No results for input(s): "PROBNP" in the last 8760 hours. HbA1C: No results for input(s): "HGBA1C" in the last 72 hours. CBG: Recent Labs  Lab 09/30/22 0732 09/30/22 1200 09/30/22 1638 09/30/22 2042 10/01/22 0736  GLUCAP 120* 98 108* 168* 96    Lipid Profile: No results for input(s): "CHOL", "HDL", "LDLCALC", "TRIG", "CHOLHDL", "LDLDIRECT" in the last 72 hours. Thyroid Function Tests: No results for input(s): "TSH", "T4TOTAL", "FREET4", "T3FREE", "THYROIDAB" in the last 72 hours. Anemia Panel: No results for input(s): "VITAMINB12", "FOLATE", "FERRITIN", "TIBC", "IRON", "RETICCTPCT" in the last 72 hours. Sepsis Labs: No results for input(s): "PROCALCITON", "LATICACIDVEN" in the last 168 hours.  No results found for this or any previous visit (from the past 240 hour(s)).       Radiology Studies: No results found.      Scheduled Meds:  amLODipine  10 mg Oral Daily   apixaban  5 mg Oral BID   arformoterol  15 mcg Nebulization BID   ARIPiprazole  10 mg Oral Daily   budesonide (PULMICORT) nebulizer solution  0.25 mg Nebulization BID   carvedilol  3.125 mg Oral BID WC   escitalopram  5 mg  Oral Daily   folic acid  1 mg Oral Daily   furosemide  40 mg Oral BID   gabapentin  600 mg Oral TID   insulin aspart  0-9 Units Subcutaneous TID WC   lisinopril  10 mg Oral Daily   revefenacin  175 mcg Nebulization Daily   thiamine  100 mg Oral Daily   Continuous Infusions:        Aline August, MD [facility] 10/01/2022, 9:30 AM

## 2022-10-02 LAB — BASIC METABOLIC PANEL
Anion gap: 11 (ref 5–15)
BUN: 20 mg/dL (ref 6–20)
CO2: 23 mmol/L (ref 22–32)
Calcium: 9.5 mg/dL (ref 8.9–10.3)
Chloride: 105 mmol/L (ref 98–111)
Creatinine, Ser: 0.96 mg/dL (ref 0.61–1.24)
GFR, Estimated: >60 mL/min (ref >60)
Glucose, Bld: 90 mg/dL (ref 70–99)
Potassium: 3.9 mmol/L (ref 3.5–5.1)
Sodium: 139 mmol/L (ref 135–145)

## 2022-10-02 LAB — CBC
HCT: 40.1 % (ref 39.0–52.0)
Hemoglobin: 13.6 g/dL (ref 13.0–17.0)
MCH: 30.2 pg (ref 26.0–34.0)
MCHC: 33.9 g/dL (ref 30.0–36.0)
MCV: 88.9 fL (ref 80.0–100.0)
Platelets: 227 10*3/uL (ref 150–400)
RBC: 4.51 MIL/uL (ref 4.22–5.81)
RDW: 18.3 % — ABNORMAL HIGH (ref 11.5–15.5)
WBC: 5.5 10*3/uL (ref 4.0–10.5)
nRBC: 0 % (ref 0.0–0.2)

## 2022-10-02 LAB — GLUCOSE, CAPILLARY
Glucose-Capillary: 116 mg/dL — ABNORMAL HIGH (ref 70–99)
Glucose-Capillary: 84 mg/dL (ref 70–99)
Glucose-Capillary: 157 mg/dL — ABNORMAL HIGH (ref 70–99)
Glucose-Capillary: 125 mg/dL — ABNORMAL HIGH (ref 70–99)

## 2022-10-02 LAB — MAGNESIUM: Magnesium: 1.7 mg/dL (ref 1.7–2.4)

## 2022-10-02 NOTE — Progress Notes (Signed)
PROGRESS NOTE    [name]  [id number]:[id number] DOB: [birth year] DOA: 08/16/2022 PCP: Marliss Coots, NP   Brief Narrative:   56 y.o.  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  He was subsequently started on anticoagulation and admitted to the hospitalist service.  Limited echo showed EF of less than 20%.  Further hospital course was complicated by patient having suicidal ideation-psychiatry was consulted-bedside sitter was placed-with recommendations to pursue inpatient psychiatric admission.  Later, patient was cleared by psychiatry, no indication for inpatient psych admission, social worker assisting with SNF placement. Currently medically stable for discharge.  Assessment & Plan:  Pulmonary embolism with right lung infarct:  Etiology likely due to noncompliance Indefinite anticoagulation-given prior episode of VTE.  Continue Eliquis   Chronic HFpEF/HFrEF:  Euvolemic-continue Lasix/Coreg/lisinopril.  Continue strict input and output, daily weights, fluid restriction. Outpatient cardiology follow-up-if he is compliant with medication-could be switched to Entresto    Suicidal ideation: Appreciate psych input - now stable, cleared by psych.  Outpatient follow-up with psychiatry.  Continue escitalopram, aripiprazole   HTN  Stable-continue amlodipine/Coreg/lisinopril/Lasix   History of bipolar disorder Continue Abilify.    History of CVA with right hemiparesis/facial droop On anticoagulation   Peripheral neuropathy: Continue Neurontin and as needed oxycodone.    DM-2 (A1c 6.0 on 9/17):  Continue CBGs with SSI   History of right AKA-left great toe amputation   Left medial malleoli  ulcer Wound care as per wound care nurse recommendations.   COPD Not in exacerbation-continue Brovana/Yupelri/budesonide- will convert to inhaler regimen when closer to discharge.   Polysubstance abuse-cocaine Counseled   Homelessness:  Very vague about his situation-claims he sleeps wherever he can-eats whatever he can get-claims he is on a wheelchair (right AKA/right hemiparesis).   Social worker following.  DVT prophylaxis: Eliquis Code Status: Full Family Communication: None at bedside Disposition Plan: Status is: Inpatient Remains inpatient appropriate because: Awaiting safe disposition.  Currently medically stable for discharge.    Consultants: Psychiatry  Procedures: None  Antimicrobials: None currently   Subjective: Patient seen and examined at bedside.  Poor historian.  No chest pain, fever or vomiting reported  objective: Vitals:   10/01/22 1638 10/01/22 2032 10/01/22 2057 10/02/22 0530  BP: (!) 147/80  113/81 (!) 164/95  Pulse: 66 66 88 65  Resp: 18 18 18 18   Temp: 97.8 F (36.6 C)  97.9 F (36.6 C) 97.7 F (36.5 C)  TempSrc: Oral  Oral Oral  SpO2: 97% 96% 98% 100%  Weight:      Height:        Intake/Output Summary (Last 24 hours) at 10/02/2022 0751 Last data filed at 10/02/2022 0549 Gross per 24 hour  Intake 895 ml  Output 400 ml  Net 495 ml    Filed Weights   08/16/22 1543 09/11/22 0502 09/17/22 2133  Weight: 86.2 kg 75.4 kg 75.4 kg    Examination:  General exam: No acute distress currently.  Still on room air.  Poor historian.  Slow to respond.  Flat affect. Respiratory system: Bilateral decreased breath sounds in bases  cardiovascular system: S1-2 heard; rate controlled mostly  gastrointestinal system: Abdomen is still has some distention; soft and nontender.  Bowel sounds are heard normally  extremities: Right AKA present.  Left great  toe amputation present   Data Reviewed: I have personally reviewed following labs and imaging  studies  CBC: Recent Labs  Lab 10/02/22 0151  WBC 5.5  HGB 13.6  HCT 40.1  MCV 88.9  PLT 227    Basic Metabolic Panel: Recent Labs  Lab 10/02/22 0151  NA 139  K 3.9  CL 105  CO2 23  GLUCOSE 90  BUN 20  CREATININE 0.96  CALCIUM 9.5  MG 1.7    GFR: Estimated Creatinine Clearance: 88 mL/min (by C-G formula based on SCr of 0.96 mg/dL). Liver Function Tests: No results for input(s): "AST", "ALT", "ALKPHOS", "BILITOT", "PROT", "ALBUMIN" in the last 168 hours. No results for input(s): "LIPASE", "AMYLASE" in the last 168 hours. No results for input(s): "AMMONIA" in the last 168 hours. Coagulation Profile: No results for input(s): "INR", "PROTIME" in the last 168 hours. Cardiac Enzymes: No results for input(s): "CKTOTAL", "CKMB", "CKMBINDEX", "TROPONINI" in the last 168 hours. BNP (last 3 results) No results for input(s): "PROBNP" in the last 8760 hours. HbA1C: No results for input(s): "HGBA1C" in the last 72 hours. CBG: Recent Labs  Lab 10/01/22 0736 10/01/22 1119 10/01/22 1639 10/01/22 2056 10/02/22 0733  GLUCAP 96 86 97 124* 116*    Lipid Profile: No results for input(s): "CHOL", "HDL", "LDLCALC", "TRIG", "CHOLHDL", "LDLDIRECT" in the last 72 hours. Thyroid Function Tests: No results for input(s): "TSH", "T4TOTAL", "FREET4", "T3FREE", "THYROIDAB" in the last 72 hours. Anemia Panel: No results for input(s): "VITAMINB12", "FOLATE", "FERRITIN", "TIBC", "IRON", "RETICCTPCT" in the last 72 hours. Sepsis Labs: No results for input(s): "PROCALCITON", "LATICACIDVEN" in the last 168 hours.  No results found for this or any previous visit (from the past 240 hour(s)).       Radiology Studies: No results found.      Scheduled Meds:  amLODipine  10 mg Oral Daily   apixaban  5 mg Oral BID   arformoterol  15 mcg Nebulization BID   ARIPiprazole  10 mg Oral Daily   budesonide (PULMICORT) nebulizer solution  0.25 mg Nebulization BID   carvedilol  3.125 mg Oral  BID WC   escitalopram  5 mg Oral Daily   folic acid  1 mg Oral Daily   furosemide  40 mg Oral BID   gabapentin  600 mg Oral TID   insulin aspart  0-9 Units Subcutaneous TID WC   lisinopril  10 mg Oral Daily   revefenacin  175 mcg Nebulization Daily   thiamine  100 mg Oral Daily   Continuous Infusions:        Glade Lloyd, MD [facility] 10/02/2022, 7:51 AM

## 2022-10-02 NOTE — Progress Notes (Signed)
Mobility Specialist Progress Note:   10/02/22 0900  Mobility  Activity Transferred from bed to chair  Level of Assistance Modified independent, requires aide device or extra time  Assistive Device Wheelchair  Distance Ambulated (ft) 100 ft  Activity Response Tolerated well  Mobility Referral Yes  $Mobility charge 1 Mobility   Pt eager for mobility session. Transferred to w/c at modI level, then self-propelled w/c in room. Pt left with all needs met, eating breakfast.   [place] or Office Phone: [phone]

## 2022-10-02 NOTE — TOC Progression Note (Signed)
Transition of Care [facility]) - Initial/Assessment Note    Patient Details  Name: [name] MRN: [id number] Date of Birth: [birth year]  Transition of Care [facility]) CM/SW Contact:    Milinda Antis, LCSWA Phone Number: 10/02/2022, 10:39 AM  Clinical Narrative:                 [name] contacted Christine with [facility] to inquire about when the patient's face to face assessment will take place.  [name] informed that there is not a date given for when the assessor will arrive.    [name] met with patient at bedside and provided update.  TOC will continue to follow.    Expected Discharge Plan: Skilled Nursing Facility Barriers to Discharge: [place] (PASRR), Insurance Authorization, SNF Pending bed offer, Active Substance Use - Placement, Homeless with medical needs   Patient Goals and CMS Choice Patient states their goals for this hospitalization and ongoing recovery are:: Rehab CMS Medicare.gov Compare Post Acute Care list provided to:: Patient Choice offered to / list presented to : Patient  Expected Discharge Plan and Services Expected Discharge Plan: [place] In-house Referral: Clinical Social Work   Post Acute Care Choice: [place] Living arrangements for the past 2 months: Homeless                                      Prior Living Arrangements/Services Living arrangements for the past 2 months: Homeless Lives with:: Self Patient language and need for interpreter reviewed:: Yes Do you feel safe going back to the place where you live?: Yes      Need for Family Participation in Patient Care: No (Comment) Care giver support system in place?: No (comment) Current home services: DME Administrator, Civil Service) Criminal Activity/Legal Involvement Pertinent to Current Situation/Hospitalization: No - Comment as needed  Activities of Daily Living      Permission Sought/Granted                  Emotional Assessment Appearance:: Appears  stated age     Orientation: : Oriented to Self, Oriented to Place, Oriented to  Time, Oriented to Situation Alcohol / Substance Use: Illicit Drugs Psych Involvement: Yes (comment)  Admission diagnosis:  Pulmonary emboli (HCC) [I26.99] Acute pulmonary embolism, unspecified pulmonary embolism type, unspecified whether acute cor pulmonale present (HCC) [I26.99] Patient Active Problem List   Diagnosis Date Noted   Pulmonary emboli ([place]) 08/17/2022   Venous ulcer ([place]) 07/23/2022   Sinus bradycardia 07/23/2022   Anemia of chronic disease 07/23/2022   S/P AKA (above knee amputation), right ([place]) 07/23/2022   Acute on chronic systolic congestive heart failure ([place]) 07/12/2022   History of DVT (deep vein thrombosis) 07/12/2022   CAP (community acquired pneumonia) 07/12/2022   Chronic venous hypertension (idiopathic) with ulcer and inflammation of bilateral lower extremity ([place])    Sepsis due to cellulitis ([place]) 04/04/2022   Normocytic anemia 04/04/2022   Acute deep vein thrombosis (DVT) of brachial vein ([place]) 03/31/2022   Swelling of lower extremity 03/23/2022   SIRS (systemic inflammatory response syndrome) ([place]) 03/23/2022   Leg swelling 03/23/2022   Acute on chronic systolic heart failure ([place]) 02/26/2022   Seizure ([place]) 02/26/2022   Venous stasis ulcer of right ankle limited to breakdown of skin with varicose veins ([place])    Hypertensive urgency 01/29/2022   Prolonged QT interval 01/29/2022   Alcohol abuse 01/29/2022   chronic ulcers 01/29/2022  Wound infection 12/04/2021   Open wounds involving multiple regions of lower extremity 12/03/2021   Mixed diabetic hyperlipidemia associated with type 2 diabetes mellitus ([place]) 12/03/2021   Cellulitis of right lower extremity 11/13/2021   Type 2 diabetes mellitus with diabetic polyneuropathy, without long-term current use of insulin ([place]) 11/13/2021   Antisocial personality disorder ([place]) 11/04/2021   Malingering 11/04/2021   Foot pain  04/23/2021   Chronic combined systolic and diastolic congestive heart failure ([place]) 03/02/2021   History of stroke 03/02/2021   Polysubstance abuse ([place]) 03/02/2021   Skin ulcer of right great toe with necrosis of bone ([place])    Encounter for assessment of healthcare decision-making capacity    Pain, dental    Dystonia    Dysarthria    Shock liver 53/66/4403   Acute metabolic encephalopathy 47/42/5956   Coronary artery disease involving native coronary artery of native heart without angina pectoris 01/04/2021   Essential hypertension 01/04/2021   Bipolar I disorder ([place]) 01/04/2021   Cocaine abuse ([place]) 01/04/2021   Homelessness 01/04/2021   HLD (hyperlipidemia) 01/04/2021   Osteomyelitis of great toe (HCC)    Ulcer of great toe, left, with necrosis of bone (HCC)    Ulcer of great toe, right, with necrosis of muscle (HCC)    Acute osteomyelitis of toe, left (HCC)    Acute on chronic combined systolic and diastolic CHF (congestive heart failure) ([place]) 12/20/2020   Respiratory failure ([place])    Ischemic stroke ([place]) 12/17/2020   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   Diabetic foot ulcer ([place]) 12/08/2020   CHF (congestive heart failure) ([place]) 11/14/2020   CHF exacerbation ([place]) 11/13/2020   Hypokalemia    Hyperlipidemia    Acute hypoxemic respiratory failure ([place]) 10/26/2020   COPD with acute exacerbation ([place]) 10/14/2020   Acute on chronic combined systolic and diastolic CHF (congestive heart failure) ([place]) 10/02/2020   Heart failure with reduced ejection fraction and diastolic dysfunction ([place]) 09/07/2020   Near syncope 07/21/2020   Acid reflux 06/03/2020   Cannabis abuse 03/19/2020   Acute bronchitis due to Rhinovirus 09/08/2019   Schizoaffective disorder ([place]) 06/24/2019   SOB (shortness of breath)    QT prolongation 05/25/2019   Cocaine abuse ([place])    Elevated troponin    Homeless    Suicidal ideation    Bilateral lower extremity pain 04/05/2019   Housing situation unstable  03/28/2019   Bilateral lower extremity edema    Acute exacerbation of CHF (congestive heart failure) ([place]) 03/04/2019   Alcohol withdrawal syndrome, with delirium ([place]) 05/03/2018   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   Mild intermittent asthma without complication 38/75/6433   Asthma 12/26/2016   Tobacco use disorder 12/26/2016   Schizophrenia ([place]) 12/26/2016   Severe episode of recurrent major depressive disorder, with psychotic features ([place]) 10/31/2016   Cocaine abuse with cocaine-induced mood disorder ([place]) 09/06/2016   Alcohol use disorder, mild, abuse 07/31/2016   Substance induced mood disorder ([place]) 07/30/2016   Schizoaffective disorder, bipolar type ([place]) 05/31/2016   Cocaine use disorder, severe, dependence ([place]) 05/31/2016   Major depressive disorder, recurrent episode ([employer]) 05/30/2016   Suicidal behavior with attempted self-injury ([place]) 05/08/2016   Acetaminophen overdose 05/06/2016   PCP:  Marliss Coots, NP Pharmacy:   [facility], [place] [address] [place] [address] Phone: [phone] Fax: [place] [address] Phone: [phone] Fax: [phone]  Social Determinants of Health (SDOH) Interventions    Readmission Risk Interventions    08/18/2022    9:00 AM 07/18/2022    4:11 PM 04/08/2022    1:43 PM  Readmission Risk Prevention Plan  Transportation Screening Complete Complete Complete  Medication Review Press photographer) Complete Complete Complete  PCP or Specialist appointment within 3-5 days of discharge Complete Complete Complete  HRI or Home Care Consult Complete Complete Not Complete  HRI or Home Care Consult Pt Refusal Comments   Pt is homeless  SW Recovery Care/Counseling Consult Complete Complete Complete  Palliative Care Screening Not Applicable  Not [place] Not Applicable Complete Not Applicable

## 2022-10-03 LAB — GLUCOSE, CAPILLARY
Glucose-Capillary: 177 mg/dL — ABNORMAL HIGH (ref 70–99)
Glucose-Capillary: 102 mg/dL — ABNORMAL HIGH (ref 70–99)
Glucose-Capillary: 120 mg/dL — ABNORMAL HIGH (ref 70–99)
Glucose-Capillary: 121 mg/dL — ABNORMAL HIGH (ref 70–99)

## 2022-10-03 NOTE — Plan of Care (Signed)
  Problem: Activity: Goal: Risk for activity intolerance will decrease Outcome: Progressing   Problem: Coping: Goal: Level of anxiety will decrease Outcome: Progressing   Problem: Elimination: Goal: Will not experience complications related to bowel motility Outcome: Progressing Goal: Will not experience complications related to urinary retention Outcome: Progressing   Problem: Pain Managment: Goal: General experience of comfort will improve Outcome: Progressing   Problem: Safety: Goal: Ability to remain free from injury will improve Outcome: Progressing   Problem: Skin Integrity: Goal: Risk for impaired skin integrity will decrease Outcome: Progressing   

## 2022-10-03 NOTE — Progress Notes (Signed)
Mobility Specialist Progress Note:   10/03/22 1555  Mobility  Activity Transferred from bed to chair  Level of Assistance Modified independent, requires aide device or extra time  Assistive Device Wheelchair  Activity Response Tolerated well  Mobility Referral Yes  $Mobility charge 1 Mobility   Pt agreeable to mobility session. No physical assistance required. Pt back in bed with all needs met.   Nelta Numbers Acute Rehab Secure Chat or Office Phone: [phone]

## 2022-10-03 NOTE — Progress Notes (Signed)
PROGRESS NOTE    [name]  [id number] DOB: 1966/07/03 DOA: 08/16/2022 PCP: Marliss Coots, NP   Brief Narrative:   56 y.o.  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  He was subsequently started on anticoagulation and admitted to the hospitalist service.  Limited echo showed EF of less than 20%.  Further hospital course was complicated by patient having suicidal ideation-psychiatry was consulted-bedside sitter was placed-with recommendations to pursue inpatient psychiatric admission.  Later, patient was cleared by psychiatry, no indication for inpatient psych admission, social worker assisting with SNF placement. Currently medically stable for discharge.  Assessment & Plan:  Pulmonary embolism with right lung infarct:  Etiology likely due to noncompliance Indefinite anticoagulation-given prior episode of VTE.  Continue Eliquis   Chronic HFpEF/HFrEF:  Euvolemic-continue Lasix/Coreg/lisinopril.  Continue strict input and output, daily weights, fluid restriction. Outpatient cardiology follow-up-if he is compliant with medication-could be switched to Entresto    Suicidal ideation: Appreciate psych input - now stable, cleared by psych.  Outpatient follow-up with psychiatry.  Continue escitalopram, aripiprazole   HTN  Stable-continue amlodipine/Coreg/lisinopril/Lasix   History of bipolar disorder Continue Abilify.    History of CVA with right hemiparesis/facial droop On anticoagulation   Peripheral neuropathy: Continue Neurontin and as needed oxycodone.    DM-2 (A1c 6.0 on 9/17):  Continue CBGs with SSI   History of right AKA-left great toe amputation   Left medial malleoli  ulcer Wound care as per wound care nurse recommendations.   COPD Not in exacerbation-continue Brovana/Yupelri/budesonide- will convert to inhaler regimen when closer to discharge.   Polysubstance abuse-cocaine Counseled   Homelessness:  Very vague about his situation-claims he sleeps wherever he can-eats whatever he can get-claims he is on a wheelchair (right AKA/right hemiparesis).   Social worker following.  DVT prophylaxis: Eliquis Code Status: Full Family Communication: None at bedside Disposition Plan: Status is: Inpatient Remains inpatient appropriate because: Awaiting safe disposition.  Currently medically stable for discharge.    Consultants: Psychiatry  Procedures: None  Antimicrobials: None currently   Subjective: Patient seen and examined at bedside.  Poor historian.  No chest pain shortness with, fever reported.   Objective: Vitals:   10/02/22 0811 10/02/22 1638 10/02/22 2105 10/03/22 0628  BP: 128/84 (!) 147/90 118/78 (!) 141/84  Pulse: 81 73 79 61  Resp: 20 18 18 16   Temp: 97.7 F (36.5 C) (!) 97.5 F (36.4 C) 98.2 F (36.8 C) 97.6 F (36.4 C)  TempSrc: Oral Oral Oral Oral  SpO2: 99% 100% 97% 100%  Weight:   80 kg   Height:        Intake/Output Summary (Last 24 hours) at 10/03/2022 0731 Last data filed at 10/02/2022 2130 Gross per 24 hour  Intake 1520 ml  Output 300 ml  Net 1220 ml    Filed Weights   09/11/22 0502 09/17/22 2133 10/02/22 2105  Weight: 75.4 kg 75.4 kg 80 kg    Examination:  General exam: On room air currently.  No distress.  Poor historian.  Slow to respond.  Flat affect. Respiratory system: Decreased breath sounds at bases bilaterally with some scattered crackles  cardiovascular system: Mostly rate controlled; S1 and S2 are heard gastrointestinal system: Abdomen is distended; soft and nontender.  Normal bowel sounds heard  extremities:  Right AKA present.  Left great toe amputation present   Data Reviewed: I have  personally reviewed following labs and imaging studies  CBC: Recent Labs  Lab 10/02/22 0151  WBC 5.5  HGB 13.6  HCT 40.1  MCV 88.9  PLT 478    Basic Metabolic Panel: Recent Labs  Lab 10/02/22 0151  NA 139  K 3.9  CL 105  CO2 23  GLUCOSE 90  BUN 20  CREATININE 0.96  CALCIUM 9.5  MG 1.7    GFR: Estimated Creatinine Clearance: 88 mL/min (by C-G formula based on SCr of 0.96 mg/dL). Liver Function Tests: No results for input(s): "AST", "ALT", "ALKPHOS", "BILITOT", "PROT", "ALBUMIN" in the last 168 hours. No results for input(s): "LIPASE", "AMYLASE" in the last 168 hours. No results for input(s): "AMMONIA" in the last 168 hours. Coagulation Profile: No results for input(s): "INR", "PROTIME" in the last 168 hours. Cardiac Enzymes: No results for input(s): "CKTOTAL", "CKMB", "CKMBINDEX", "TROPONINI" in the last 168 hours. BNP (last 3 results) No results for input(s): "PROBNP" in the last 8760 hours. HbA1C: No results for input(s): "HGBA1C" in the last 72 hours. CBG: Recent Labs  Lab 10/02/22 0733 10/02/22 1110 10/02/22 1633 10/02/22 2102 10/03/22 0729  GLUCAP 116* 84 157* 125* 177*    Lipid Profile: No results for input(s): "CHOL", "HDL", "LDLCALC", "TRIG", "CHOLHDL", "LDLDIRECT" in the last 72 hours. Thyroid Function Tests: No results for input(s): "TSH", "T4TOTAL", "FREET4", "T3FREE", "THYROIDAB" in the last 72 hours. Anemia Panel: No results for input(s): "VITAMINB12", "FOLATE", "FERRITIN", "TIBC", "IRON", "RETICCTPCT" in the last 72 hours. Sepsis Labs: No results for input(s): "PROCALCITON", "LATICACIDVEN" in the last 168 hours.  No results found for this or any previous visit (from the past 240 hour(s)).       Radiology Studies: No results found.      Scheduled Meds:  amLODipine  10 mg Oral Daily   apixaban  5 mg Oral BID   arformoterol  15 mcg Nebulization BID   ARIPiprazole  10 mg Oral Daily   budesonide (PULMICORT) nebulizer solution   0.25 mg Nebulization BID   carvedilol  3.125 mg Oral BID WC   escitalopram  5 mg Oral Daily   folic acid  1 mg Oral Daily   furosemide  40 mg Oral BID   gabapentin  600 mg Oral TID   insulin aspart  0-9 Units Subcutaneous TID WC   lisinopril  10 mg Oral Daily   revefenacin  175 mcg Nebulization Daily   thiamine  100 mg Oral Daily   Continuous Infusions:        Aline August, MD [facility] 10/03/2022, 7:31 AM

## 2022-10-04 DIAGNOSIS — I10 Essential (primary) hypertension: Secondary | ICD-10-CM

## 2022-10-04 LAB — GLUCOSE, CAPILLARY
Glucose-Capillary: 164 mg/dL — ABNORMAL HIGH (ref 70–99)
Glucose-Capillary: 102 mg/dL — ABNORMAL HIGH (ref 70–99)
Glucose-Capillary: 144 mg/dL — ABNORMAL HIGH (ref 70–99)
Glucose-Capillary: 117 mg/dL — ABNORMAL HIGH (ref 70–99)

## 2022-10-04 NOTE — Progress Notes (Signed)
PROGRESS NOTE    Nova Evett  [id number]:[id number] DOB: [birth year] DOA: 08/16/2022 PCP: Marliss Coots, NP   Brief Narrative:   56 y.o.  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  He was subsequently started on anticoagulation and admitted to the hospitalist service.  Limited echo showed EF of less than 20%.  Further hospital course was complicated by patient having suicidal ideation-psychiatry was consulted-bedside sitter was placed-with recommendations to pursue inpatient psychiatric admission.  Later, patient was cleared by psychiatry, no indication for inpatient psych admission, social worker assisting with SNF placement. Currently medically stable for discharge.  Assessment & Plan:  Pulmonary embolism with right lung infarct:  Etiology likely due to noncompliance Indefinite anticoagulation-given prior episode of VTE.  Continue Eliquis   Chronic HFpEF/HFrEF:  Euvolemic-continue Lasix/Coreg/lisinopril.  Continue strict input and output, daily weights, fluid restriction. Outpatient cardiology follow-up-if he is compliant with medication-could be switched to Entresto    Suicidal ideation: Appreciate psych input - now stable, cleared by psych.  Outpatient follow-up with psychiatry.  Continue escitalopram, aripiprazole   HTN  Stable-continue amlodipine/Coreg/lisinopril/Lasix   History of bipolar disorder Continue Abilify.    History of CVA with right hemiparesis/facial droop On anticoagulation   Peripheral neuropathy: Continue Neurontin and as needed oxycodone.    DM-2 (A1c 6.0 on 9/17):  Continue CBGs with SSI   History of right AKA-left great toe amputation   Left medial malleoli  ulcer Wound care as per wound care nurse recommendations.   COPD Not in exacerbation-continue Brovana/Yupelri/budesonide- will convert to inhaler regimen when closer to discharge.   Polysubstance abuse-cocaine Counseled   Homelessness:  Very vague about his situation-claims he sleeps wherever he can-eats whatever he can get-claims he is on a wheelchair (right AKA/right hemiparesis).   Social worker following.  DVT prophylaxis: Eliquis Code Status: Full Family Communication: None at bedside Disposition Plan: Status is: Inpatient Remains inpatient appropriate because: Awaiting safe disposition.  Currently medically stable for discharge.    Consultants: Psychiatry  Procedures: None  Antimicrobials: None currently   Subjective: Patient seen and examined at bedside.  Poor historian.  No fever, vomiting or worsening shortness of breath reported.   Objective: Vitals:   10/03/22 0842 10/03/22 1646 10/03/22 2056 10/04/22 0538  BP: 133/85 139/78 (!) 150/85 (!) 159/92  Pulse: 88 74 69 71  Resp: 18 18 18 17   Temp: (!) 97.5 F (36.4 C) 98.1 F (36.7 C) 98.5 F (36.9 C) 98.5 F (36.9 C)  TempSrc: Oral Oral    SpO2: 98% 99% 97% 97%  Weight:      Height:        Intake/Output Summary (Last 24 hours) at 10/04/2022 0731 Last data filed at 10/04/2022 0510 Gross per 24 hour  Intake 1170 ml  Output 800 ml  Net 370 ml    Filed Weights   09/11/22 0502 09/17/22 2133 10/02/22 2105  Weight: 75.4 kg 75.4 kg 80 kg    Examination:  General exam: No acute distress.  Still on room air.  Poor historian.  Slow to respond.  Flat affect. Respiratory system: Bilateral decreased breath sounds at bases with scattered crackles  cardiovascular system: S1-S2 heard; rate mostly controlled  gastrointestinal system: Abdomen still has some distention; soft and nontender.  Bowel sounds are heard  extremities: Right AKA present.  Left great toe amputation present   Data Reviewed: I have personally  reviewed following labs and imaging studies  CBC: Recent Labs  Lab 10/02/22 0151  WBC 5.5  HGB 13.6  HCT 40.1  MCV 88.9  PLT [id number]    Basic Metabolic Panel: Recent Labs  Lab 10/02/22 0151  NA 139  K 3.9  CL 105  CO2 23  GLUCOSE 90  BUN 20  CREATININE 0.96  CALCIUM 9.5  MG 1.7    GFR: Estimated Creatinine Clearance: 88 mL/min (by C-G formula based on SCr of 0.96 mg/dL). Liver Function Tests: No results for input(s): "AST", "ALT", "ALKPHOS", "BILITOT", "PROT", "ALBUMIN" in the last 168 hours. No results for input(s): "LIPASE", "AMYLASE" in the last 168 hours. No results for input(s): "AMMONIA" in the last 168 hours. Coagulation Profile: No results for input(s): "INR", "PROTIME" in the last 168 hours. Cardiac Enzymes: No results for input(s): "CKTOTAL", "CKMB", "CKMBINDEX", "TROPONINI" in the last 168 hours. BNP (last 3 results) No results for input(s): "PROBNP" in the last 8760 hours. HbA1C: No results for input(s): "HGBA1C" in the last 72 hours. CBG: Recent Labs  Lab 10/02/22 2102 10/03/22 0729 10/03/22 1137 10/03/22 1647 10/03/22 2054  GLUCAP 125* 177* 102* 120* 121*    Lipid Profile: No results for input(s): "CHOL", "HDL", "LDLCALC", "TRIG", "CHOLHDL", "LDLDIRECT" in the last 72 hours. Thyroid Function Tests: No results for input(s): "TSH", "T4TOTAL", "FREET4", "T3FREE", "THYROIDAB" in the last 72 hours. Anemia Panel: No results for input(s): "VITAMINB12", "FOLATE", "FERRITIN", "TIBC", "IRON", "RETICCTPCT" in the last 72 hours. Sepsis Labs: No results for input(s): "PROCALCITON", "LATICACIDVEN" in the last 168 hours.  No results found for this or any previous visit (from the past 240 hour(s)).       Radiology Studies: No results found.      Scheduled Meds:  amLODipine  10 mg Oral Daily   apixaban  5 mg Oral BID   arformoterol  15 mcg Nebulization BID   ARIPiprazole  10 mg Oral Daily   budesonide (PULMICORT) nebulizer solution  0.25 mg  Nebulization BID   carvedilol  3.125 mg Oral BID WC   escitalopram  5 mg Oral Daily   folic acid  1 mg Oral Daily   furosemide  40 mg Oral BID   gabapentin  600 mg Oral TID   insulin aspart  0-9 Units Subcutaneous TID WC   lisinopril  10 mg Oral Daily   revefenacin  175 mcg Nebulization Daily   thiamine  100 mg Oral Daily   Continuous Infusions:        Aline August, MD [facility] 10/04/2022, 7:31 AM

## 2022-10-05 LAB — GLUCOSE, CAPILLARY
Glucose-Capillary: 136 mg/dL — ABNORMAL HIGH (ref 70–99)
Glucose-Capillary: 135 mg/dL — ABNORMAL HIGH (ref 70–99)
Glucose-Capillary: 156 mg/dL — ABNORMAL HIGH (ref 70–99)
Glucose-Capillary: 125 mg/dL — ABNORMAL HIGH (ref 70–99)

## 2022-10-05 NOTE — Progress Notes (Signed)
PROGRESS NOTE    [name]  DTO:[id number] DOB: June 11, 1966 DOA: 08/16/2022 PCP: Marliss Coots, NP   Brief Narrative:   56 y.o.  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  He was subsequently started on anticoagulation and admitted to the hospitalist service.  Limited echo showed EF of less than 20%.  Further hospital course was complicated by patient having suicidal ideation-psychiatry was consulted-bedside sitter was placed-with recommendations to pursue inpatient psychiatric admission.  Later, patient was cleared by psychiatry, no indication for inpatient psych admission, social worker assisting with SNF placement. Currently medically stable for discharge.  Assessment & Plan:  Pulmonary embolism with right lung infarct:  Etiology likely due to noncompliance Indefinite anticoagulation-given prior episode of VTE.  Continue Eliquis   Chronic HFpEF/HFrEF:  Euvolemic-continue Lasix/Coreg/lisinopril.  Continue strict input and output, daily weights, fluid restriction. Outpatient cardiology follow-up-if he is compliant with medication-could be switched to Entresto    Suicidal ideation: Appreciate psych input - now stable, cleared by psych.  Outpatient follow-up with psychiatry.  Continue escitalopram, aripiprazole   HTN  Stable-continue amlodipine/Coreg/lisinopril/Lasix   History of bipolar disorder Continue Abilify.    History of CVA with right hemiparesis/facial droop On anticoagulation   Peripheral neuropathy: Continue Neurontin and as needed oxycodone.    DM-2 (A1c 6.0 on 9/17):  Continue CBGs with SSI   History of right AKA-left great toe amputation   Left medial malleoli  ulcer Wound care as per wound care nurse recommendations.   COPD Not in exacerbation-continue Brovana/Yupelri/budesonide- will convert to inhaler regimen when closer to discharge.   Polysubstance abuse-cocaine Counseled   Homelessness:  Very vague about his situation-claims he sleeps wherever he can-eats whatever he can get-claims he is on a wheelchair (right AKA/right hemiparesis).   Social worker following.  DVT prophylaxis: Eliquis Code Status: Full Family Communication: None at bedside Disposition Plan: Status is: Inpatient Remains inpatient appropriate because: Awaiting safe disposition.  Currently medically stable for discharge.    Consultants: Psychiatry  Procedures: None  Antimicrobials: None currently   Subjective: Patient seen and examined at bedside.  Poor historian.  No worsening shortness of breath, fever, vomiting reported.   Objective: Vitals:   10/04/22 0923 10/04/22 1701 10/04/22 2108 10/05/22 0558  BP:  (!) 142/94 135/69 (!) 139/98  Pulse:  81 70 61  Resp:   17 18  Temp:  98.3 F (36.8 C) 98.5 F (36.9 C) 98 F (36.7 C)  TempSrc:  Oral    SpO2: 99% 95% 97% 100%  Weight:      Height:        Intake/Output Summary (Last 24 hours) at 10/05/2022 0742 Last data filed at 10/05/2022 9833 Gross per 24 hour  Intake 960 ml  Output 800 ml  Net 160 ml    Filed Weights   09/11/22 0502 09/17/22 2133 10/02/22 2105  Weight: 75.4 kg 75.4 kg 80 kg    Examination:  General exam: On room air.  No distress.  Poor historian.  Slow to respond.  Flat affect. Respiratory system: Decreased breath sounds at bases bilaterally with some crackles  cardiovascular system: Rate controlled currently; S1 and S2 heard gastrointestinal system: Abdomen is slightly distended; soft and nontender.  Bowel sounds are heard extremities: Right AKA present.  Left great toe  amputation present   Data Reviewed: I have personally reviewed following labs and imaging  studies  CBC: Recent Labs  Lab 10/02/22 0151  WBC 5.5  HGB 13.6  HCT 40.1  MCV 88.9  PLT [id number]    Basic Metabolic Panel: Recent Labs  Lab 10/02/22 0151  NA 139  K 3.9  CL 105  CO2 23  GLUCOSE 90  BUN 20  CREATININE 0.96  CALCIUM 9.5  MG 1.7    GFR: Estimated Creatinine Clearance: 88 mL/min (by C-G formula based on SCr of 0.96 mg/dL). Liver Function Tests: No results for input(s): "AST", "ALT", "ALKPHOS", "BILITOT", "PROT", "ALBUMIN" in the last 168 hours. No results for input(s): "LIPASE", "AMYLASE" in the last 168 hours. No results for input(s): "AMMONIA" in the last 168 hours. Coagulation Profile: No results for input(s): "INR", "PROTIME" in the last 168 hours. Cardiac Enzymes: No results for input(s): "CKTOTAL", "CKMB", "CKMBINDEX", "TROPONINI" in the last 168 hours. BNP (last 3 results) No results for input(s): "PROBNP" in the last 8760 hours. HbA1C: No results for input(s): "HGBA1C" in the last 72 hours. CBG: Recent Labs  Lab 10/04/22 0805 10/04/22 1116 10/04/22 1551 10/04/22 2107 10/05/22 0721  GLUCAP 164* 102* 144* 117* 136*    Lipid Profile: No results for input(s): "CHOL", "HDL", "LDLCALC", "TRIG", "CHOLHDL", "LDLDIRECT" in the last 72 hours. Thyroid Function Tests: No results for input(s): "TSH", "T4TOTAL", "FREET4", "T3FREE", "THYROIDAB" in the last 72 hours. Anemia Panel: No results for input(s): "VITAMINB12", "FOLATE", "FERRITIN", "TIBC", "IRON", "RETICCTPCT" in the last 72 hours. Sepsis Labs: No results for input(s): "PROCALCITON", "LATICACIDVEN" in the last 168 hours.  No results found for this or any previous visit (from the past 240 hour(s)).       Radiology Studies: No results found.      Scheduled Meds:  amLODipine  10 mg Oral Daily   apixaban  5 mg Oral BID   arformoterol  15 mcg Nebulization BID   ARIPiprazole  10 mg Oral Daily   budesonide (PULMICORT) nebulizer solution  0.25 mg Nebulization BID   carvedilol  3.125  mg Oral BID WC   escitalopram  5 mg Oral Daily   folic acid  1 mg Oral Daily   furosemide  40 mg Oral BID   gabapentin  600 mg Oral TID   insulin aspart  0-9 Units Subcutaneous TID WC   lisinopril  10 mg Oral Daily   revefenacin  175 mcg Nebulization Daily   thiamine  100 mg Oral Daily   Continuous Infusions:        Aline August, MD [facility] 10/05/2022, 7:42 AM

## 2022-10-06 LAB — GLUCOSE, CAPILLARY
Glucose-Capillary: 174 mg/dL — ABNORMAL HIGH (ref 70–99)
Glucose-Capillary: 84 mg/dL (ref 70–99)
Glucose-Capillary: 134 mg/dL — ABNORMAL HIGH (ref 70–99)
Glucose-Capillary: 165 mg/dL — ABNORMAL HIGH (ref 70–99)

## 2022-10-06 NOTE — Progress Notes (Signed)
Mobility Specialist Progress Note:   10/06/22 0900  Mobility  Activity Transferred from bed to chair  Level of Assistance Modified independent, requires aide device or extra time  Assistive Device Wheelchair  Distance Ambulated (ft) 1000 ft  Activity Response Tolerated well  Mobility Referral Yes  $Mobility charge 1 Mobility   Pt eager for mobility session. No physical assistance required throughout. Pt left with all needs met.   Nelta Numbers Acute Rehab Secure Chat or Office Phone: [phone]

## 2022-10-06 NOTE — Progress Notes (Signed)
PROGRESS NOTE    [name]  [id number] DOB: 1966-08-01 DOA: 08/16/2022 PCP: Marliss Coots, NP   Brief Narrative:   56 y.o.  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  He was subsequently started on anticoagulation and admitted to the hospitalist service.  Limited echo showed EF of less than 20%.  Further hospital course was complicated by patient having suicidal ideation-psychiatry was consulted-bedside sitter was placed-with recommendations to pursue inpatient psychiatric admission.  Later, patient was cleared by psychiatry, no indication for inpatient psych admission, social worker assisting with SNF placement. Currently medically stable for discharge.  Assessment & Plan:  Pulmonary embolism with right lung infarct:  Etiology likely due to noncompliance Indefinite anticoagulation-given prior episode of VTE.  Continue Eliquis   Chronic HFpEF/HFrEF:  Euvolemic-continue Lasix/Coreg/lisinopril.  Continue strict input and output, daily weights, fluid restriction. Outpatient cardiology follow-up-if he is compliant with medication-could be switched to Entresto    Suicidal ideation: Appreciate psych input - now stable, cleared by psych.  Outpatient follow-up with psychiatry.  Continue escitalopram, aripiprazole   HTN  Stable-continue amlodipine/Coreg/lisinopril/Lasix   History of bipolar disorder Continue Abilify.    History of CVA with right hemiparesis/facial droop On anticoagulation   Peripheral neuropathy: Continue Neurontin and as needed oxycodone.    DM-2 (A1c 6.0 on 9/17):  Continue CBGs with SSI   History of right AKA-left great toe amputation   Left medial malleoli  ulcer Wound care as per wound care nurse recommendations.   COPD Not in exacerbation-continue Brovana/Yupelri/budesonide- will convert to inhaler regimen when closer to discharge.   Polysubstance abuse-cocaine Counseled   Homelessness:  Very vague about his situation-claims he sleeps wherever he can-eats whatever he can get-claims he is on a wheelchair (right AKA/right hemiparesis).   Social worker following.  DVT prophylaxis: Eliquis Code Status: Full Family Communication: None at bedside Disposition Plan: Status is: Inpatient Remains inpatient appropriate because: Awaiting safe disposition.  Currently medically stable for discharge.    Consultants: Psychiatry  Procedures: None  Antimicrobials: None currently   Subjective: Patient seen and examined at bedside.  Poor historian.  No chest pain, agitation, fever or vomiting reported.   Objective: Vitals:   10/05/22 0918 10/05/22 1804 10/05/22 2147 10/06/22 0531  BP: (!) 145/84 (!) 149/85 128/79 125/76  Pulse: 72 75 71 71  Resp:   17 18  Temp: 97.8 F (36.6 C) 98.1 F (36.7 C) 98.5 F (36.9 C) 98.5 F (36.9 C)  TempSrc: Oral Oral    SpO2: 100% 94% 100% 99%  Weight:      Height:        Intake/Output Summary (Last 24 hours) at 10/06/2022 0739 Last data filed at 10/06/2022 0500 Gross per 24 hour  Intake 720 ml  Output 0 ml  Net 720 ml    Filed Weights   09/11/22 0502 09/17/22 2133 10/02/22 2105  Weight: 75.4 kg 75.4 kg 80 kg    Examination:  General exam: No acute distress.  Still on room air currently.  Poor historian.  Slow to respond.  Flat affect. Respiratory system: Bilateral decreased breath sounds at bases with scattered crackles  cardiovascular system: S1-S2 heard; rate is mostly controlled  gastrointestinal system: Abdomen has some distention; soft and nontender.  Normal bowel sounds heard extremities: Right  AKA present.  Left great toe amputation present   Data Reviewed: I have personally  reviewed following labs and imaging studies  CBC: Recent Labs  Lab 10/02/22 0151  WBC 5.5  HGB 13.6  HCT 40.1  MCV 88.9  PLT 741    Basic Metabolic Panel: Recent Labs  Lab 10/02/22 0151  NA 139  K 3.9  CL 105  CO2 23  GLUCOSE 90  BUN 20  CREATININE 0.96  CALCIUM 9.5  MG 1.7    GFR: Estimated Creatinine Clearance: 88 mL/min (by C-G formula based on SCr of 0.96 mg/dL). Liver Function Tests: No results for input(s): "AST", "ALT", "ALKPHOS", "BILITOT", "PROT", "ALBUMIN" in the last 168 hours. No results for input(s): "LIPASE", "AMYLASE" in the last 168 hours. No results for input(s): "AMMONIA" in the last 168 hours. Coagulation Profile: No results for input(s): "INR", "PROTIME" in the last 168 hours. Cardiac Enzymes: No results for input(s): "CKTOTAL", "CKMB", "CKMBINDEX", "TROPONINI" in the last 168 hours. BNP (last 3 results) No results for input(s): "PROBNP" in the last 8760 hours. HbA1C: No results for input(s): "HGBA1C" in the last 72 hours. CBG: Recent Labs  Lab 10/05/22 0721 10/05/22 1113 10/05/22 1613 10/05/22 2147 10/06/22 0732  GLUCAP 136* 135* 156* 125* 174*    Lipid Profile: No results for input(s): "CHOL", "HDL", "LDLCALC", "TRIG", "CHOLHDL", "LDLDIRECT" in the last 72 hours. Thyroid Function Tests: No results for input(s): "TSH", "T4TOTAL", "FREET4", "T3FREE", "THYROIDAB" in the last 72 hours. Anemia Panel: No results for input(s): "VITAMINB12", "FOLATE", "FERRITIN", "TIBC", "IRON", "RETICCTPCT" in the last 72 hours. Sepsis Labs: No results for input(s): "PROCALCITON", "LATICACIDVEN" in the last 168 hours.  No results found for this or any previous visit (from the past 240 hour(s)).       Radiology Studies: No results found.      Scheduled Meds:  amLODipine  10 mg Oral Daily   apixaban  5 mg Oral BID   arformoterol  15 mcg Nebulization BID   ARIPiprazole  10 mg Oral Daily   budesonide (PULMICORT) nebulizer solution  0.25 mg  Nebulization BID   carvedilol  3.125 mg Oral BID WC   escitalopram  5 mg Oral Daily   folic acid  1 mg Oral Daily   furosemide  40 mg Oral BID   gabapentin  600 mg Oral TID   insulin aspart  0-9 Units Subcutaneous TID WC   lisinopril  10 mg Oral Daily   revefenacin  175 mcg Nebulization Daily   thiamine  100 mg Oral Daily   Continuous Infusions:        Aline August, MD [facility] 10/06/2022, 7:39 AM

## 2022-10-06 NOTE — TOC Progression Note (Addendum)
Transition of Care [facility]) - Initial/Assessment Note    Patient Details  Name: [name] MRN: [id number] Date of Birth: 1966/05/04  [facility]) CM/SW Contact:    Milinda Antis, LCSWA Phone Number: 10/06/2022, 11:17 AM  Clinical Narrative:                 [name] met with the patient at bedside.  Patient reports that he is ready to discharge as he has already been admitted for "almost 2 months".  [name] explained the barriers to discharge.  Patient informed [name] that he was at [facility] in [place] previously and was told that he could return.  [name] contacted [facility], [phone], and spoke with intake.  The facility will need to verify the patient's insurance and will contact [name] with determination.    11:24-  CSW informed that the facility does not have wheelchair assess and therefore the cannot accept the patient.   1500-  [name] contacted the [facility] in [place] to inquire about their ability to accept the patient.  [name] was directed to contact [facility], [phone], to inquire about bed availability and handicap accessibility.  [name] left a VM requesting a returned call.    TOC will continue to follow.   Expected Discharge Plan: Skilled Nursing Facility Barriers to Discharge: [place] (PASRR), Insurance Authorization, SNF Pending bed offer, Active Substance Use - Placement, Homeless with medical needs   Patient Goals and CMS Choice Patient states their goals for this hospitalization and ongoing recovery are:: Rehab CMS Medicare.gov Compare Post Acute Care list provided to:: Patient Choice offered to / list presented to : Patient  Expected Discharge Plan and Services Expected Discharge Plan: [place] In-house Referral: Clinical Social Work   Post Acute Care Choice: [place] Living arrangements for the past 2 months: Homeless                                      Prior Living  Arrangements/Services Living arrangements for the past 2 months: Homeless Lives with:: Self Patient language and need for interpreter reviewed:: Yes Do you feel safe going back to the place where you live?: Yes      Need for Family Participation in Patient Care: No (Comment) Care giver support system in place?: No (comment) Current home services: DME Administrator, Civil Service) Criminal Activity/Legal Involvement Pertinent to Current Situation/Hospitalization: No - Comment as needed  Activities of Daily Living   ADL Screening (condition at time of admission) Patient's cognitive ability adequate to safely complete daily activities?: Yes Is the patient deaf or have difficulty hearing?: No Does the patient have difficulty seeing, even when wearing glasses/contacts?: No Does the patient have difficulty concentrating, remembering, or making decisions?: No Patient able to express need for assistance with ADLs?: Yes Does the patient have difficulty dressing or bathing?: No Independently performs ADLs?: Yes (appropriate for developmental age) Does the patient have difficulty walking or climbing stairs?: Yes Weakness of Legs: Both Weakness of Arms/Hands: None  Permission Sought/Granted                  Emotional Assessment Appearance:: Appears stated age     Orientation: : Oriented to Self, Oriented to Place, Oriented to  Time, Oriented to Situation Alcohol / Substance Use: Illicit Drugs Psych Involvement: Yes (comment)  Admission diagnosis:  Pulmonary emboli (HCC) [I26.99] Acute pulmonary embolism, unspecified pulmonary  embolism type, unspecified whether acute cor pulmonale present [place]) [I26.99] Patient Active Problem List   Diagnosis Date Noted   Pulmonary emboli ([place]) 08/17/2022   Venous ulcer ([place]) 07/23/2022   Sinus bradycardia 07/23/2022   Anemia of chronic disease 07/23/2022   S/P AKA (above knee amputation), right ([place]) 07/23/2022   Acute on chronic systolic congestive heart failure  ([place]) 07/12/2022   History of DVT (deep vein thrombosis) 07/12/2022   CAP (community acquired pneumonia) 07/12/2022   Chronic venous hypertension (idiopathic) with ulcer and inflammation of bilateral lower extremity ([place])    Sepsis due to cellulitis ([place]) 04/04/2022   Normocytic anemia 04/04/2022   Acute deep vein thrombosis (DVT) of brachial vein ([place]) 03/31/2022   Swelling of lower extremity 03/23/2022   SIRS (systemic inflammatory response syndrome) ([place]) 03/23/2022   Leg swelling 03/23/2022   Acute on chronic systolic heart failure ([place]) 02/26/2022   Seizure ([place]) 02/26/2022   Venous stasis ulcer of right ankle limited to breakdown of skin with varicose veins (HCC)    Hypertensive urgency 01/29/2022   Prolonged QT interval 01/29/2022   Alcohol abuse 01/29/2022   chronic ulcers 01/29/2022   Wound infection 12/04/2021   Open wounds involving multiple regions of lower extremity 12/03/2021   Mixed diabetic hyperlipidemia associated with type 2 diabetes mellitus ([place]) 12/03/2021   Cellulitis of right lower extremity 11/13/2021   Type 2 diabetes mellitus with diabetic polyneuropathy, without long-term current use of insulin ([place]) 11/13/2021   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)    Encounter for assessment of healthcare decision-making capacity    Pain, dental    Dystonia    Dysarthria    Shock liver 43/15/4008   Acute metabolic encephalopathy 67/61/9509   Coronary artery disease involving native coronary artery of native heart without angina pectoris 01/04/2021   Essential hypertension 01/04/2021   Bipolar I disorder ([place]) 01/04/2021   Cocaine abuse ([place]) 01/04/2021   Homelessness 01/04/2021   HLD  (hyperlipidemia) 01/04/2021   Osteomyelitis of great toe (HCC)    Ulcer of great toe, left, with necrosis of bone (HCC)    Ulcer of great toe, right, with necrosis of muscle (HCC)    Acute osteomyelitis of toe, left (HCC)    Acute on chronic combined systolic and diastolic CHF (congestive heart failure) ([place]) 12/20/2020   Respiratory failure ([place])    Ischemic stroke ([place]) 12/17/2020   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   Diabetic foot ulcer ([place]) 12/08/2020   CHF (congestive heart failure) ([place]) 11/14/2020   CHF exacerbation ([place]) 11/13/2020   Hypokalemia    Hyperlipidemia    Acute hypoxemic respiratory failure ([place]) 10/26/2020   COPD with acute exacerbation ([place]) 10/14/2020   Acute on chronic combined systolic and diastolic CHF (congestive heart failure) ([place]) 10/02/2020   Heart failure with reduced ejection fraction and diastolic dysfunction ([place]) 09/07/2020   Near syncope 07/21/2020   Acid reflux 06/03/2020   Cannabis abuse 03/19/2020   Acute bronchitis due to Rhinovirus 09/08/2019   Schizoaffective disorder ([place]) 06/24/2019   SOB (shortness of breath)    QT prolongation 05/25/2019   Cocaine abuse ([place])    Elevated troponin    Homeless    Suicidal ideation    Bilateral lower extremity pain 04/05/2019  Housing situation unstable 03/28/2019   Bilateral lower extremity edema    Acute exacerbation of CHF (congestive heart failure) ([place]) 03/04/2019   Alcohol withdrawal syndrome, with delirium ([place]) 05/03/2018   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   Mild intermittent asthma without complication 24/23/5361   Asthma 12/26/2016   Tobacco use disorder 12/26/2016   Schizophrenia ([place]) 12/26/2016   Severe episode of recurrent major depressive disorder, with psychotic features ([place]) 10/31/2016   Cocaine abuse with cocaine-induced mood disorder ([place]) 09/06/2016   Alcohol use disorder, mild, abuse 07/31/2016   Substance induced mood disorder ([place])  07/30/2016   Schizoaffective disorder, bipolar type ([place]) 05/31/2016   Cocaine use disorder, severe, dependence ([place]) 05/31/2016   Major depressive disorder, recurrent episode ([place]) 05/30/2016   Suicidal behavior with attempted self-injury ([place]) 05/08/2016   Acetaminophen overdose 05/06/2016   PCP:  Marliss Coots, NP Pharmacy:   [facility] [place] [address] [place] [address] Phone: [phone] Fax: [place] [address] Phone: [phone] Fax: [phone]     Social Determinants of Health (SDOH) Interventions    Readmission Risk Interventions    08/18/2022    9:00 AM 07/18/2022    4:11 PM 04/08/2022    1:43 PM  Readmission Risk Prevention Plan  Transportation Screening Complete Complete Complete  Medication Review (RN Care Manager) Complete Complete Complete  PCP or Specialist appointment within 3-5 days of discharge Complete Complete Complete  HRI or Home Care Consult Complete Complete Not Complete  HRI or Home Care Consult Pt Refusal Comments   Pt is homeless  SW Recovery Care/Counseling Consult Complete Complete Complete  Palliative Care Screening Not Applicable  Not [place] Not Applicable Complete Not Applicable

## 2022-10-07 LAB — GLUCOSE, CAPILLARY
Glucose-Capillary: 115 mg/dL — ABNORMAL HIGH (ref 70–99)
Glucose-Capillary: 119 mg/dL — ABNORMAL HIGH (ref 70–99)
Glucose-Capillary: 141 mg/dL — ABNORMAL HIGH (ref 70–99)

## 2022-10-07 NOTE — Progress Notes (Signed)
Refused all vitals this evening.

## 2022-10-07 NOTE — Progress Notes (Signed)
Mobility Specialist Progress Note:   10/07/22 0900  Mobility  Activity Transferred from bed to chair  Level of Assistance Modified independent, requires aide device or extra time  Assistive Device Wheelchair  Distance Ambulated (ft) 100 ft  Activity Response Tolerated well  Mobility Referral Yes  $Mobility charge 1 Mobility   Pt received transferring to w/c at a modI level then self-propelled short distance. No physical assistance required throughout. Pt left with all needs met.   Nelta Numbers Acute Rehab Secure Chat or Office Phone: [phone]

## 2022-10-07 NOTE — Plan of Care (Signed)
  Problem: Activity: Goal: Risk for activity intolerance will decrease Outcome: Completed/Met

## 2022-10-07 NOTE — Progress Notes (Signed)
PROGRESS NOTE    [name]  WER:[id number] DOB: January 15, 1966 DOA: 08/16/2022 PCP: [name], NP   Brief Narrative:   56 y.o.  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  He was subsequently started on anticoagulation and admitted to the hospitalist service.  Limited echo showed EF of less than 20%.  Further hospital course was complicated by patient having suicidal ideation-psychiatry was consulted-bedside sitter was placed-with recommendations to pursue inpatient psychiatric admission.  Later, patient was cleared by psychiatry, no indication for inpatient psych admission, social worker assisting with SNF placement. Currently medically stable for discharge.  Assessment & Plan:  Pulmonary embolism with right lung infarct:  Etiology likely due to noncompliance Indefinite anticoagulation-given prior episode of VTE.  Continue Eliquis   Chronic HFpEF/HFrEF:  Euvolemic-continue Lasix/Coreg/lisinopril.  Continue strict input and output, daily weights, fluid restriction. Outpatient cardiology follow-up-if he is compliant with medication-could be switched to Entresto    Suicidal ideation: Appreciate psych input - now stable, cleared by psych.  Outpatient follow-up with psychiatry.  Continue escitalopram, aripiprazole   HTN  Stable-continue amlodipine/Coreg/lisinopril/Lasix   History of bipolar disorder Continue Abilify.    History of CVA with right hemiparesis/facial droop On anticoagulation   Peripheral neuropathy: Continue Neurontin and as needed oxycodone.    DM-2 (A1c 6.0 on 9/17):  Continue CBGs with SSI   History of right AKA-left great toe amputation   Left medial malleoli  ulcer Wound care as per wound care nurse recommendations.   COPD Not in exacerbation-continue Brovana/Yupelri/budesonide- will convert to inhaler regimen when closer to discharge.   Polysubstance abuse-cocaine Counseled   Homelessness:  Very vague about his situation-claims he sleeps wherever he can-eats whatever he can get-claims he is on a wheelchair (right AKA/right hemiparesis).   Social worker following.  DVT prophylaxis: Eliquis Code Status: Full Family Communication: None at bedside Disposition Plan: Status is: Inpatient Remains inpatient appropriate because: Awaiting safe disposition.  Currently medically stable for discharge.    Consultants: Psychiatry  Procedures: None  Antimicrobials: None currently   Subjective: Patient seen and examined at bedside.  Poor historian.  No fever, worsening shortness of breath or vomiting reported.   Objective: Vitals:   10/06/22 0802 10/06/22 1702 10/06/22 2039 10/07/22 0410  BP: 131/88 120/80 123/76 130/81  Pulse: 97 72 70 71  Resp: 19 17 18 18   Temp: 98.5 F (36.9 C) 98.2 F (36.8 C) 97.8 F (36.6 C) 97.9 F (36.6 C)  TempSrc: Oral  Oral Oral  SpO2: 100% 100% 99% 100%  Weight:      Height:        Intake/Output Summary (Last 24 hours) at 10/07/2022 0752 Last data filed at 10/07/2022 0000 Gross per 24 hour  Intake 1080 ml  Output 300 ml  Net 780 ml    Filed Weights   09/11/22 0502 09/17/22 2133 10/02/22 2105  Weight: 75.4 kg 75.4 kg 80 kg    Examination:  General exam: On room air.  Currently in no distress.2106  Poor historian.  Slow to respond.  Flat affect. Respiratory system: Decreased breath sounds at bases bilaterally with crackles  cardiovascular system: Rate controlled; S1 and S2 are heard gastrointestinal system: Abdomen is distended slightly; soft and nontender.  Bowel sounds are normally extremities: Right AKA present.  Left great toe amputation present   Data Reviewed: I have personally reviewed  following labs and imaging studies  CBC: Recent Labs  Lab 10/02/22 0151  WBC 5.5  HGB 13.6  HCT 40.1  MCV 88.9  PLT 734    Basic Metabolic Panel: Recent Labs  Lab 10/02/22 0151  NA 139  K 3.9  CL 105  CO2 23  GLUCOSE 90  BUN 20  CREATININE 0.96  CALCIUM 9.5  MG 1.7    GFR: Estimated Creatinine Clearance: 88 mL/min (by C-G formula based on SCr of 0.96 mg/dL). Liver Function Tests: No results for input(s): "AST", "ALT", "ALKPHOS", "BILITOT", "PROT", "ALBUMIN" in the last 168 hours. No results for input(s): "LIPASE", "AMYLASE" in the last 168 hours. No results for input(s): "AMMONIA" in the last 168 hours. Coagulation Profile: No results for input(s): "INR", "PROTIME" in the last 168 hours. Cardiac Enzymes: No results for input(s): "CKTOTAL", "CKMB", "CKMBINDEX", "TROPONINI" in the last 168 hours. BNP (last 3 results) No results for input(s): "PROBNP" in the last 8760 hours. HbA1C: No results for input(s): "HGBA1C" in the last 72 hours. CBG: Recent Labs  Lab 10/05/22 2147 10/06/22 0732 10/06/22 1117 10/06/22 1658 10/06/22 2039  GLUCAP 125* 174* 84 134* 165*    Lipid Profile: No results for input(s): "CHOL", "HDL", "LDLCALC", "TRIG", "CHOLHDL", "LDLDIRECT" in the last 72 hours. Thyroid Function Tests: No results for input(s): "TSH", "T4TOTAL", "FREET4", "T3FREE", "THYROIDAB" in the last 72 hours. Anemia Panel: No results for input(s): "VITAMINB12", "FOLATE", "FERRITIN", "TIBC", "IRON", "RETICCTPCT" in the last 72 hours. Sepsis Labs: No results for input(s): "PROCALCITON", "LATICACIDVEN" in the last 168 hours.  No results found for this or any previous visit (from the past 240 hour(s)).       Radiology Studies: No results found.      Scheduled Meds:  amLODipine  10 mg Oral Daily   apixaban  5 mg Oral BID   ARIPiprazole  10 mg Oral Daily   carvedilol  3.125 mg Oral BID WC   escitalopram  5 mg Oral Daily   folic acid  1 mg Oral Daily    furosemide  40 mg Oral BID   gabapentin  600 mg Oral TID   insulin aspart  0-9 Units Subcutaneous TID WC   lisinopril  10 mg Oral Daily   thiamine  100 mg Oral Daily   Continuous Infusions:        Aline August, MD [facility] 10/07/2022, 7:52 AM

## 2022-10-07 NOTE — TOC Progression Note (Addendum)
Transition of Care [facility]) - Initial/Assessment Note    Patient Details  Name: [name] MRN: [id number] Date of Birth: 01/10/1966  Transition of Care [facility]) CM/SW Contact:    Milinda Antis, LCSWA Phone Number: 10/07/2022, 9:13 AM  Clinical Narrative:                 [name] contacted Christine with [facility] to inquire about the facility's ([facility] ability to accept the patient after the bedside assessment. Altha Harm reported today that the patient was declined previously and they could not accept.  [name] inquired again as Altha Harm was informed on 10/27 by this Education officer, museum, see note, that the patient was declined which is why the facility agreed to conduct a bedside assessment.  Christine held to decision that patient will not be accepted and that a bedside assessment will now not be conducted.    10:15-  [name] contacted [place], [phone], at the [facility] in [place].  [name] informed that the facility is unable to accept.   10:30- [name] contacted the following agencies:   [facility]- unable to accept [place]- unable to accept [facility]- The facility is still not open at this time [facility]- no availability [facility]- unable to accept [facility]- only able to accept Medicaid for detox [facility]- left VM  [name] contacted the patient's insurance [facility]) to inquire about other resources.  [name] was given the following resources.   [facility]-  unable to accept [employer] in [place] spoke with Quita Skye, [phone], and was informed that the half way house is unable to accept.  Adam informed [name] that he would contact other homes to see if anyone has a vacancy.  [name] will contact Adam back later today to get more information.  [facility]- half way house unable to accept without treatment (hospital detox will not be enough for admission) [facility]- unable to  accept [employer]- left VM  TOC will continue to follow.  Expected Discharge Plan: Skilled Nursing Facility Barriers to Discharge: [place] (PASRR), Insurance Authorization, SNF Pending bed offer, Active Substance Use - Placement, Homeless with medical needs   Patient Goals and CMS Choice Patient states their goals for this hospitalization and ongoing recovery are:: Rehab CMS Medicare.gov Compare Post Acute Care list provided to:: Patient Choice offered to / list presented to : Patient  Expected Discharge Plan and Services Expected Discharge Plan: [place] In-house Referral: Clinical Social Work   Post Acute Care Choice: [place] Living arrangements for the past 2 months: Homeless                                      Prior Living Arrangements/Services Living arrangements for the past 2 months: Homeless Lives with:: Self Patient language and need for interpreter reviewed:: Yes Do you feel safe going back to the place where you live?: Yes      Need for Family Participation in Patient Care: No (Comment) Care giver support system in place?: No (comment) Current home services: DME Administrator, Civil Service) Criminal Activity/Legal Involvement Pertinent to Current Situation/Hospitalization: No - Comment as needed  Activities of Daily Living   ADL Screening (condition at time of admission) Patient's cognitive ability adequate to safely complete daily activities?: Yes Is the patient deaf or have difficulty hearing?: No Does the patient have difficulty seeing, even  when wearing glasses/contacts?: No Does the patient have difficulty concentrating, remembering, or making decisions?: No Patient able to express need for assistance with ADLs?: Yes Does the patient have difficulty dressing or bathing?: No Independently performs ADLs?: Yes (appropriate for developmental age) Does the patient have difficulty walking or climbing  stairs?: Yes Weakness of Legs: Both Weakness of Arms/Hands: None  Permission Sought/Granted                  Emotional Assessment Appearance:: Appears stated age     Orientation: : Oriented to Self, Oriented to Place, Oriented to  Time, Oriented to Situation Alcohol / Substance Use: Illicit Drugs Psych Involvement: Yes (comment)  Admission diagnosis:  Pulmonary emboli (HCC) [I26.99] Acute pulmonary embolism, unspecified pulmonary embolism type, unspecified whether acute cor pulmonale present (HCC) [I26.99] Patient Active Problem List   Diagnosis Date Noted   Pulmonary emboli (HCC) 08/17/2022   Venous ulcer (HCC) 07/23/2022   Sinus bradycardia 07/23/2022   Anemia of chronic disease 07/23/2022   S/P AKA (above knee amputation), right (HCC) 07/23/2022   Acute on chronic systolic congestive heart failure (HCC) 07/12/2022   History of DVT (deep vein thrombosis) 07/12/2022   CAP (community acquired pneumonia) 07/12/2022   Chronic venous hypertension (idiopathic) with ulcer and inflammation of bilateral lower extremity (HCC)    Sepsis due to cellulitis (HCC) 04/04/2022   Normocytic anemia 04/04/2022   Acute deep vein thrombosis (DVT) of brachial vein (HCC) 03/31/2022   Swelling of lower extremity 03/23/2022   SIRS (systemic inflammatory response syndrome) (HCC) 03/23/2022   Leg swelling 03/23/2022   Acute on chronic systolic heart failure (HCC) 02/26/2022   Seizure (HCC) 02/26/2022   Venous stasis ulcer of right ankle limited to breakdown of skin with varicose veins (HCC)    Hypertensive urgency 01/29/2022   Prolonged QT interval 01/29/2022   Alcohol abuse 01/29/2022   chronic ulcers 01/29/2022   Wound infection 12/04/2021   Open wounds involving multiple regions of lower extremity 12/03/2021   Mixed diabetic hyperlipidemia associated with type 2 diabetes mellitus (HCC) 12/03/2021   Cellulitis of right lower extremity 11/13/2021   Type 2 diabetes mellitus with diabetic  polyneuropathy, without long-term current use of insulin (HCC) 11/13/2021   Antisocial personality disorder (HCC) 11/04/2021   Malingering 11/04/2021   Foot pain 04/23/2021   Chronic combined systolic and diastolic congestive heart failure (HCC) 03/02/2021   History of stroke 03/02/2021   Polysubstance abuse (HCC) 03/02/2021   Skin ulcer of right great toe with necrosis of bone (HCC)    Encounter for assessment of healthcare decision-making capacity    Pain, dental    Dystonia    Dysarthria    Shock liver 01/04/2021   Acute metabolic encephalopathy 01/04/2021   Coronary artery disease involving native coronary artery of native heart without angina pectoris 01/04/2021   Essential hypertension 01/04/2021   Bipolar I disorder (HCC) 01/04/2021   Cocaine abuse (HCC) 01/04/2021   Homelessness 01/04/2021   HLD (hyperlipidemia) 01/04/2021   Osteomyelitis of great toe (HCC)    Ulcer of great toe, left, with necrosis of bone (HCC)    Ulcer of great toe, right, with necrosis of muscle (HCC)    Acute osteomyelitis of toe, left (HCC)    Acute on chronic combined systolic and diastolic CHF (congestive heart failure) (HCC) 12/20/2020   Respiratory failure (HCC)    Ischemic stroke (HCC) 12/17/2020   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   Diabetic foot ulcer (HCC) 12/08/2020   CHF (congestive heart failure) (  HCC) 11/14/2020   CHF exacerbation (HCC) 11/13/2020   Hypokalemia    Hyperlipidemia    Acute hypoxemic respiratory failure (HCC) 10/26/2020   COPD with acute exacerbation (HCC) 10/14/2020   Acute on chronic combined systolic and diastolic CHF (congestive heart failure) (HCC) 10/02/2020   Heart failure with reduced ejection fraction and diastolic dysfunction (HCC) 09/07/2020   Near syncope 07/21/2020   Acid reflux 06/03/2020   Cannabis abuse 03/19/2020   Acute bronchitis due to Rhinovirus 09/08/2019   Schizoaffective disorder (HCC) 06/24/2019   SOB (shortness of breath)    QT prolongation  05/25/2019   Cocaine abuse (HCC)    Elevated troponin    Homeless    Suicidal ideation    Bilateral lower extremity pain 04/05/2019   Housing situation unstable 03/28/2019   Bilateral lower extremity edema    Acute exacerbation of CHF (congestive heart failure) (HCC) 03/04/2019   Alcohol withdrawal syndrome, with delirium (HCC) 05/03/2018   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   Mild intermittent asthma without complication 08/28/2017   Asthma 12/26/2016   Tobacco use disorder 12/26/2016   Schizophrenia (HCC) 12/26/2016   Severe episode of recurrent major depressive disorder, with psychotic features (HCC) 10/31/2016   Cocaine abuse with cocaine-induced mood disorder (HCC) 09/06/2016   Alcohol use disorder, mild, abuse 07/31/2016   Substance induced mood disorder (HCC) 07/30/2016   Schizoaffective disorder, bipolar type (HCC) 05/31/2016   Cocaine use disorder, severe, dependence (HCC) 05/31/2016   Major depressive disorder, recurrent episode (HCC) 05/30/2016   Suicidal behavior with attempted self-injury (HCC) 05/08/2016   Acetaminophen overdose 05/06/2016   PCP:  [name], NP Pharmacy:   [facility] [place], [name] - [place] [address] [name] [address] Phone: [phone] Fax: [phone]  [facility] [address] [name] [address] Phone: [phone] Fax: [phone]     Social Determinants of Health (SDOH) Interventions    Readmission Risk Interventions    08/18/2022    9:00 AM 07/18/2022    4:11 PM 04/08/2022    1:43 PM  Readmission Risk Prevention Plan  Transportation Screening Complete Complete Complete  Medication Review (RN Care Manager) Complete Complete Complete  PCP or Specialist appointment within 3-5 days of discharge Complete Complete Complete  HRI or Home Care Consult Complete Complete Not Complete  HRI or Home Care Consult Pt  Refusal Comments   Pt is homeless  SW Recovery Care/Counseling Consult Complete Complete Complete  Palliative Care Screening Not Applicable  Not Applicable  Skilled Nursing Facility Not Applicable Complete Not Applicable

## 2022-10-07 NOTE — Plan of Care (Signed)
  Problem: Activity: Goal: Risk for activity intolerance will decrease Outcome: Progressing   

## 2022-10-08 LAB — GLUCOSE, CAPILLARY
Glucose-Capillary: 134 mg/dL — ABNORMAL HIGH (ref 70–99)
Glucose-Capillary: 154 mg/dL — ABNORMAL HIGH (ref 70–99)
Glucose-Capillary: 153 mg/dL — ABNORMAL HIGH (ref 70–99)
Glucose-Capillary: 124 mg/dL — ABNORMAL HIGH (ref 70–99)

## 2022-10-08 NOTE — TOC Progression Note (Signed)
Transition of Care [facility]) - Initial/Assessment Note    Patient Details  Name: [name] MRN: [id number] Date of Birth: [birth year]  Transition of Care [facility]) CM/SW Contact:    Ralene Bathe, LCSWA Phone Number: 10/08/2022, 4:35 PM  Clinical Narrative:                 [name] received a returned call from [facility] and they are unable to accept patient.  [name] received application for the [facility] (program that provides "tiny house" shelter for homeless during winter months) and spoke with patient.  Patient reports that he would rather "go back to leaving under the bridge than live in a tiny house".  [name] explained that this is the only possible option at this time as there are many barriers to both SNF and inpatient drug rehab facilities.  Patient was adamant.  TOC leadership, Gretta Cool informed.    Expected Discharge Plan: Skilled Nursing Facility Barriers to Discharge: Awaiting State Approval (PASRR), Insurance Authorization, SNF Pending bed offer, Active Substance Use - Placement, Homeless with medical needs   Patient Goals and CMS Choice Patient states their goals for this hospitalization and ongoing recovery are:: Rehab CMS Medicare.gov Compare Post Acute Care list provided to:: Patient Choice offered to / list presented to : Patient  Expected Discharge Plan and Services Expected Discharge Plan: Skilled Nursing Facility In-house Referral: Clinical Social Work   Post Acute Care Choice: Skilled Nursing Facility Living arrangements for the past 2 months: Homeless                                      Prior Living Arrangements/Services Living arrangements for the past 2 months: Homeless Lives with:: Self Patient language and need for interpreter reviewed:: Yes Do you feel safe going back to the place where you live?: Yes      Need for Family Participation in Patient Care: No (Comment) Care giver support system in place?: No (comment) Current  home services: DME Furniture conservator/restorer) Criminal Activity/Legal Involvement Pertinent to Current Situation/Hospitalization: No - Comment as needed  Activities of Daily Living   ADL Screening (condition at time of admission) Patient's cognitive ability adequate to safely complete daily activities?: Yes Is the patient deaf or have difficulty hearing?: No Does the patient have difficulty seeing, even when wearing glasses/contacts?: No Does the patient have difficulty concentrating, remembering, or making decisions?: No Patient able to express need for assistance with ADLs?: Yes Does the patient have difficulty dressing or bathing?: No Independently performs ADLs?: Yes (appropriate for developmental age) Does the patient have difficulty walking or climbing stairs?: Yes Weakness of Legs: Both Weakness of Arms/Hands: None  Permission Sought/Granted                  Emotional Assessment Appearance:: Appears stated age     Orientation: : Oriented to Self, Oriented to Place, Oriented to  Time, Oriented to Situation Alcohol / Substance Use: Illicit Drugs Psych Involvement: Yes (comment)  Admission diagnosis:  Pulmonary emboli (HCC) [I26.99] Acute pulmonary embolism, unspecified pulmonary embolism type, unspecified whether acute cor pulmonale present (HCC) [I26.99] Patient Active Problem List   Diagnosis Date Noted   Pulmonary emboli (HCC) 08/17/2022   Venous ulcer (HCC) 07/23/2022   Sinus bradycardia 07/23/2022   Anemia of chronic disease 07/23/2022   S/P AKA (above knee amputation), right (HCC) 07/23/2022   Acute on chronic systolic congestive heart failure (  HCC) 07/12/2022   History of DVT (deep vein thrombosis) 07/12/2022   CAP (community acquired pneumonia) 07/12/2022   Chronic venous hypertension (idiopathic) with ulcer and inflammation of bilateral lower extremity (HCC)    Sepsis due to cellulitis (HCC) 04/04/2022   Normocytic anemia 04/04/2022   Acute deep vein thrombosis (DVT)  of brachial vein (HCC) 03/31/2022   Swelling of lower extremity 03/23/2022   SIRS (systemic inflammatory response syndrome) (HCC) 03/23/2022   Leg swelling 03/23/2022   Acute on chronic systolic heart failure (HCC) 02/26/2022   Seizure (HCC) 02/26/2022   Venous stasis ulcer of right ankle limited to breakdown of skin with varicose veins (HCC)    Hypertensive urgency 01/29/2022   Prolonged QT interval 01/29/2022   Alcohol abuse 01/29/2022   chronic ulcers 01/29/2022   Wound infection 12/04/2021   Open wounds involving multiple regions of lower extremity 12/03/2021   Mixed diabetic hyperlipidemia associated with type 2 diabetes mellitus (HCC) 12/03/2021   Cellulitis of right lower extremity 11/13/2021   Type 2 diabetes mellitus with diabetic polyneuropathy, without long-term current use of insulin (HCC) 11/13/2021   Antisocial personality disorder (HCC) 11/04/2021   Malingering 11/04/2021   Foot pain 04/23/2021   Chronic combined systolic and diastolic congestive heart failure (HCC) 03/02/2021   History of stroke 03/02/2021   Polysubstance abuse (HCC) 03/02/2021   Skin ulcer of right great toe with necrosis of bone (HCC)    Encounter for assessment of healthcare decision-making capacity    Pain, dental    Dystonia    Dysarthria    Shock liver 01/04/2021   Acute metabolic encephalopathy 01/04/2021   Coronary artery disease involving native coronary artery of native heart without angina pectoris 01/04/2021   Essential hypertension 01/04/2021   Bipolar I disorder (HCC) 01/04/2021   Cocaine abuse (HCC) 01/04/2021   Homelessness 01/04/2021   HLD (hyperlipidemia) 01/04/2021   Osteomyelitis of great toe (HCC)    Ulcer of great toe, left, with necrosis of bone (HCC)    Ulcer of great toe, right, with necrosis of muscle (HCC)    Acute osteomyelitis of toe, left (HCC)    Acute on chronic combined systolic and diastolic CHF (congestive heart failure) (HCC) 12/20/2020   Respiratory failure  (HCC)    Ischemic stroke (HCC) 12/17/2020   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   Diabetic foot ulcer (HCC) 12/08/2020   CHF (congestive heart failure) (HCC) 11/14/2020   CHF exacerbation (HCC) 11/13/2020   Hypokalemia    Hyperlipidemia    Acute hypoxemic respiratory failure (HCC) 10/26/2020   COPD with acute exacerbation (HCC) 10/14/2020   Acute on chronic combined systolic and diastolic CHF (congestive heart failure) (HCC) 10/02/2020   Heart failure with reduced ejection fraction and diastolic dysfunction (HCC) 09/07/2020   Near syncope 07/21/2020   Acid reflux 06/03/2020   Cannabis abuse 03/19/2020   Acute bronchitis due to Rhinovirus 09/08/2019   Schizoaffective disorder (HCC) 06/24/2019   SOB (shortness of breath)    QT prolongation 05/25/2019   Cocaine abuse (HCC)    Elevated troponin    Homeless    Suicidal ideation    Bilateral lower extremity pain 04/05/2019   Housing situation unstable 03/28/2019   Bilateral lower extremity edema    Acute exacerbation of CHF (congestive heart failure) (HCC) 03/04/2019   Alcohol withdrawal syndrome, with delirium (HCC) 05/03/2018   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   Mild intermittent asthma without complication 08/28/2017   Asthma 12/26/2016   Tobacco use disorder 12/26/2016  Schizophrenia (HCC) 12/26/2016   Severe episode of recurrent major depressive disorder, with psychotic features (HCC) 10/31/2016   Cocaine abuse with cocaine-induced mood disorder (HCC) 09/06/2016   Alcohol use disorder, mild, abuse 07/31/2016   Substance induced mood disorder (HCC) 07/30/2016   Schizoaffective disorder, bipolar type (HCC) 05/31/2016   Cocaine use disorder, severe, dependence (HCC) 05/31/2016   Major depressive disorder, recurrent episode (HCC) 05/30/2016   Suicidal behavior with attempted self-injury (HCC) 05/08/2016   Acetaminophen overdose 05/06/2016   PCP:  [name], NP Pharmacy:   [facility] [place] [name] [facility] [place] [address] [name] [address] Phone: [phone] Fax: [phone]  [facility] [address] [name] [address] Phone: [phone] Fax: [phone]     Social Determinants of Health (SDOH) Interventions    Readmission Risk Interventions    08/18/2022    9:00 AM 07/18/2022    4:11 PM 04/08/2022    1:43 PM  Readmission Risk Prevention Plan  Transportation Screening Complete Complete Complete  Medication Review (RN Care Manager) Complete Complete Complete  PCP or Specialist appointment within 3-5 days of discharge Complete Complete Complete  HRI or Home Care Consult Complete Complete Not Complete  HRI or Home Care Consult Pt Refusal Comments   Pt is homeless  SW Recovery Care/Counseling Consult Complete Complete Complete  Palliative Care Screening Not Applicable  Not Applicable  Skilled Nursing Facility Not Applicable Complete Not Applicable

## 2022-10-08 NOTE — Progress Notes (Signed)
Mobility Specialist Progress Note:   10/08/22 1100  Mobility  Activity Transferred from bed to chair (+ self propel in w/c.)  Level of Assistance Modified independent, requires aide device or extra time  Assistive Device Wheelchair  Distance Ambulated (ft) 500 ft (self-propel)  Mobility Referral Yes  $Mobility charge 1 Mobility   Pt received transferring to w/c. Required no physical assistance, pt then self-propelled in hallway. Left with all needs met.   Nelta Numbers Acute Rehab Secure Chat or Office Phone: [phone]

## 2022-10-08 NOTE — Progress Notes (Signed)
PROGRESS NOTE    [name]  IRS:[id number] DOB: [birth year] DOA: 08/16/2022 PCP: [name], NP   Brief Narrative:   56 y.o.  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  He was subsequently started on anticoagulation and admitted to the hospitalist service.  Limited echo showed EF of less than 20%.  Further hospital course was complicated by patient having suicidal ideation-psychiatry was consulted-bedside sitter was placed-with recommendations to pursue inpatient psychiatric admission.  Later, patient was cleared by psychiatry, no indication for inpatient psych admission, social worker assisting with SNF placement. Currently medically stable for discharge.  Assessment & Plan:  Pulmonary embolism with right lung infarct:  Etiology likely due to noncompliance Indefinite anticoagulation-given prior episode of VTE.  Continue Eliquis   Chronic HFpEF/HFrEF:  Euvolemic-continue Lasix/Coreg/lisinopril.  Continue strict input and output, daily weights, fluid restriction. Outpatient cardiology follow-up-if he is compliant with medication-could be switched to Entresto    Suicidal ideation: Appreciate psych input - now stable, cleared by psych.  Outpatient follow-up with psychiatry.  Continue escitalopram, aripiprazole   HTN  Stable-continue amlodipine/Coreg/lisinopril/Lasix   History of bipolar disorder Continue Abilify.    History of CVA with right hemiparesis/facial droop On anticoagulation   Peripheral neuropathy: Continue Neurontin and as needed oxycodone.    DM-2 (A1c 6.0 on 9/17):  Continue CBGs with SSI   History of right AKA-left great toe amputation   Left medial malleoli  ulcer Wound care as per wound care nurse recommendations.   COPD Not in exacerbation-continue Brovana/Yupelri/budesonide- will convert to inhaler regimen when closer to discharge.   Polysubstance abuse-cocaine Counseled   Homelessness:  Very vague about his situation-claims he sleeps wherever he can-eats whatever he can get-claims he is on a wheelchair (right AKA/right hemiparesis).   Social worker following.  DVT prophylaxis: Eliquis Code Status: Full Family Communication: None at bedside Disposition Plan: Status is: Inpatient Remains inpatient appropriate because: Awaiting safe disposition.  Currently medically stable for discharge.    Consultants: Psychiatry  Procedures: None  Antimicrobials: None currently   Subjective: Patient seen and examined at bedside.  Poor historian.  No worsening shortness of breath, fever, chest pain reported.   Objective: Vitals:   10/07/22 0800 10/07/22 1210 10/07/22 1750 10/08/22 0600  BP: 131/75 113/85 (!) 143/80 (!) 156/94  Pulse: 72 76 70 61  Resp: 18  16 18   Temp: 97.8 F (36.6 C)  98 F (36.7 C) 97.7 F (36.5 C)  TempSrc:   Oral Oral  SpO2: 100% 98%  100%  Weight:      Height:        Intake/Output Summary (Last 24 hours) at 10/08/2022 0748 Last data filed at 10/07/2022 2300 Gross per 24 hour  Intake 580 ml  Output 0 ml  Net 580 ml    Filed Weights   09/11/22 0502 09/17/22 2133 10/02/22 2105  Weight: 75.4 kg 75.4 kg 80 kg    Examination:  General exam: No distress.  Still on room air.  Poor historian.  Slow to respond.  Flat affect. Respiratory system: Bilateral decreased breath sounds at bases with scattered crackles  cardiovascular system: S1-S2 heard; mostly rate controlled  gastrointestinal system: Abdomen is mildly distended; soft and nontender.  Normal bowel sounds are heard extremities: Right AKA present.  Left  great toe amputation present   Data Reviewed: I have personally reviewed following labs and  imaging studies  CBC: Recent Labs  Lab 10/02/22 0151  WBC 5.5  HGB 13.6  HCT 40.1  MCV 88.9  PLT 227    Basic Metabolic Panel: Recent Labs  Lab 10/02/22 0151  NA 139  K 3.9  CL 105  CO2 23  GLUCOSE 90  BUN 20  CREATININE 0.96  CALCIUM 9.5  MG 1.7    GFR: Estimated Creatinine Clearance: 88 mL/min (by C-G formula based on SCr of 0.96 mg/dL). Liver Function Tests: No results for input(s): "AST", "ALT", "ALKPHOS", "BILITOT", "PROT", "ALBUMIN" in the last 168 hours. No results for input(s): "LIPASE", "AMYLASE" in the last 168 hours. No results for input(s): "AMMONIA" in the last 168 hours. Coagulation Profile: No results for input(s): "INR", "PROTIME" in the last 168 hours. Cardiac Enzymes: No results for input(s): "CKTOTAL", "CKMB", "CKMBINDEX", "TROPONINI" in the last 168 hours. BNP (last 3 results) No results for input(s): "PROBNP" in the last 8760 hours. HbA1C: No results for input(s): "HGBA1C" in the last 72 hours. CBG: Recent Labs  Lab 10/06/22 2039 10/07/22 0759 10/07/22 1207 10/07/22 1641 10/08/22 0732  GLUCAP 165* 115* 119* 141* 134*    Lipid Profile: No results for input(s): "CHOL", "HDL", "LDLCALC", "TRIG", "CHOLHDL", "LDLDIRECT" in the last 72 hours. Thyroid Function Tests: No results for input(s): "TSH", "T4TOTAL", "FREET4", "T3FREE", "THYROIDAB" in the last 72 hours. Anemia Panel: No results for input(s): "VITAMINB12", "FOLATE", "FERRITIN", "TIBC", "IRON", "RETICCTPCT" in the last 72 hours. Sepsis Labs: No results for input(s): "PROCALCITON", "LATICACIDVEN" in the last 168 hours.  No results found for this or any previous visit (from the past 240 hour(s)).       Radiology Studies: No results found.      Scheduled Meds:  amLODipine  10 mg Oral Daily   apixaban  5 mg Oral BID   ARIPiprazole  10 mg Oral Daily   carvedilol  3.125 mg Oral BID WC   escitalopram  5 mg Oral Daily   folic acid  1 mg Oral Daily   furosemide  40 mg Oral  BID   gabapentin  600 mg Oral TID   insulin aspart  0-9 Units Subcutaneous TID WC   lisinopril  10 mg Oral Daily   thiamine  100 mg Oral Daily   Continuous Infusions:        Glade Lloyd, MD [facility] 10/08/2022, 7:48 AM

## 2022-10-09 ENCOUNTER — Other Ambulatory Visit (HOSPITAL_COMMUNITY): Payer: Self-pay

## 2022-10-09 LAB — BASIC METABOLIC PANEL
Anion gap: 11 (ref 5–15)
BUN: 21 mg/dL — ABNORMAL HIGH (ref 6–20)
CO2: 25 mmol/L (ref 22–32)
Calcium: 9.4 mg/dL (ref 8.9–10.3)
Chloride: 104 mmol/L (ref 98–111)
Creatinine, Ser: 1.02 mg/dL (ref 0.61–1.24)
GFR, Estimated: >60 mL/min (ref >60)
Glucose, Bld: 97 mg/dL (ref 70–99)
Potassium: 3.8 mmol/L (ref 3.5–5.1)
Sodium: 140 mmol/L (ref 135–145)

## 2022-10-09 LAB — CBC
HCT: 41.7 % (ref 39.0–52.0)
Hemoglobin: 13.7 g/dL (ref 13.0–17.0)
MCH: 29.6 pg (ref 26.0–34.0)
MCHC: 32.9 g/dL (ref 30.0–36.0)
MCV: 90.1 fL (ref 80.0–100.0)
Platelets: 245 10*3/uL (ref 150–400)
RBC: 4.63 MIL/uL (ref 4.22–5.81)
RDW: 18.4 % — ABNORMAL HIGH (ref 11.5–15.5)
WBC: 6.4 10*3/uL (ref 4.0–10.5)
nRBC: 0 % (ref 0.0–0.2)

## 2022-10-09 LAB — GLUCOSE, CAPILLARY
Glucose-Capillary: 114 mg/dL — ABNORMAL HIGH (ref 70–99)
Glucose-Capillary: 102 mg/dL — ABNORMAL HIGH (ref 70–99)

## 2022-10-09 LAB — MAGNESIUM: Magnesium: 1.9 mg/dL (ref 1.7–2.4)

## 2022-10-09 MED ORDER — TRAZODONE HCL 50 MG PO TABS
50.0000 mg | ORAL_TABLET | Freq: Every evening | ORAL | 1 refills | Status: DC | PRN
  Filled 2022-10-09 (×2): qty 30, 30d supply, fill #0

## 2022-10-09 MED ORDER — GABAPENTIN 300 MG PO CAPS
600.0000 mg | ORAL_CAPSULE | Freq: Three times a day (TID) | ORAL | 1 refills | Status: DC
  Filled 2022-10-09 (×2): qty 90, 15d supply, fill #0

## 2022-10-09 MED ORDER — APIXABAN 5 MG PO TABS
5.0000 mg | ORAL_TABLET | Freq: Two times a day (BID) | ORAL | 5 refills | Status: DC
  Filled 2022-10-09: qty 60, 30d supply, fill #0

## 2022-10-09 MED ORDER — LISINOPRIL 10 MG PO TABS
10.0000 mg | ORAL_TABLET | Freq: Every day | ORAL | 1 refills | Status: DC
  Filled 2022-10-09 (×2): qty 30, 30d supply, fill #0

## 2022-10-09 MED ORDER — CARVEDILOL 3.125 MG PO TABS
3.1250 mg | ORAL_TABLET | Freq: Two times a day (BID) | ORAL | 3 refills | Status: DC
  Filled 2022-10-09 (×2): qty 60, 30d supply, fill #0

## 2022-10-09 MED ORDER — ARIPIPRAZOLE 10 MG PO TABS
10.0000 mg | ORAL_TABLET | Freq: Every day | ORAL | 1 refills | Status: DC
  Filled 2022-10-09 (×2): qty 30, 30d supply, fill #0

## 2022-10-09 MED ORDER — OXYCODONE HCL 5 MG PO TABS
5.0000 mg | ORAL_TABLET | Freq: Three times a day (TID) | ORAL | 0 refills | Status: DC | PRN
  Filled 2022-10-09 (×2): qty 15, 5d supply, fill #0

## 2022-10-09 MED ORDER — AMLODIPINE BESYLATE 10 MG PO TABS
10.0000 mg | ORAL_TABLET | Freq: Every day | ORAL | 1 refills | Status: DC
  Filled 2022-10-09 (×2): qty 30, 30d supply, fill #0

## 2022-10-09 MED ORDER — ESCITALOPRAM OXALATE 5 MG PO TABS
5.0000 mg | ORAL_TABLET | Freq: Every day | ORAL | 1 refills | Status: DC
  Filled 2022-10-09 (×2): qty 30, 30d supply, fill #0

## 2022-10-09 MED ORDER — APIXABAN 5 MG PO TABS
5.0000 mg | ORAL_TABLET | Freq: Two times a day (BID) | ORAL | 11 refills | Status: DC
  Filled 2022-10-09: qty 60, 30d supply, fill #0

## 2022-10-09 NOTE — TOC Transition Note (Signed)
Transition of Care [facility]) - CM/SW Discharge Note   Patient Details  Name: [name] MRN: [id number] Date of Birth: 06/08/1966  Transition of Care [facility]) CM/SW Contact:  Ralene Bathe, LCSWA Phone Number: 10/09/2022, 3:14 PM   Clinical Narrative:    Patient will DC to: [address] [employer] Anticipated DC date: 10/09/2022 Family notified: patient alert and oriented Transport by: Taxi   Patient reports that he is ready to discharge to [facility].  DC packet on chart. [name] verified that patient received medications and requested taxi to transport patient.   CSW will sign off for now as social work intervention is no longer needed. Please consult [place] again if new needs arise.    Final next level of care: Other (comment) [facility] (patient's request)) Barriers to Discharge: Barriers Resolved   Patient Goals and CMS Choice Patient states their goals for this hospitalization and ongoing recovery are:: Rehab CMS Medicare.gov Compare Post Acute Care list provided to:: Patient Choice offered to / list presented to : Patient  Discharge Placement                    Patient and family notified of of transfer: 10/09/22  Discharge Plan and Services In-house Referral: Clinical Social Work   Post Acute Care Choice: Skilled Nursing Facility                               Social Determinants of Health (SDOH) Interventions     Readmission Risk Interventions    08/18/2022    9:00 AM 07/18/2022    4:11 PM 04/08/2022    1:43 PM  Readmission Risk Prevention Plan  Transportation Screening Complete Complete Complete  Medication Review Oceanographer) Complete Complete Complete  PCP or Specialist appointment within 3-5 days of discharge Complete Complete Complete  HRI or Home Care Consult Complete Complete Not Complete  HRI or Home Care Consult Pt Refusal Comments   Pt is homeless  SW Recovery Care/Counseling Consult Complete Complete Complete   Palliative Care Screening Not Applicable  Not Applicable  Skilled Nursing Facility Not Applicable Complete Not Applicable

## 2022-10-09 NOTE — Progress Notes (Signed)
Nsg Discharge Note  Admit Date:  08/16/2022 Discharge date: 10/09/2022   Lovel Suazo to be D/C'd Home per MD order.  AVS completed. Patient/caregiver able to verbalize understanding.  Discharge Medication: Allergies as of 10/09/2022       Reactions   Motrin [ibuprofen] Anaphylaxis   Tylenol [acetaminophen] Hives, Nausea And Vomiting, Rash   Lyrica Cr [pregabalin Er] Other (See Comments)   Dystonia Dysarthria    Peanut-containing Drug Products Other (See Comments)   Pt denies allergy to peanut butter 03/02/2021        Medication List     STOP taking these medications    folic acid 1 MG tablet Commonly known as: FOLVITE   furosemide 40 MG tablet Commonly known as: LASIX   potassium chloride SA 20 MEQ tablet Commonly known as: KLOR-CON M   thiamine 100 MG tablet Commonly known as: VITAMIN B1       TAKE these medications    amLODipine 10 MG tablet Commonly known as: NORVASC Take 1 tablet (10 mg total) by mouth daily. Start taking on: October 10, 2022   ARIPiprazole 10 MG tablet Commonly known as: ABILIFY Take 1 tablet (10 mg total) by mouth daily. Start taking on: October 10, 2022 What changed:  medication strength how much to take   carvedilol 3.125 MG tablet Commonly known as: COREG Take 1 tablet (3.125 mg total) by mouth 2 (two) times daily with a meal.   Eliquis 5 MG Tabs tablet Generic drug: apixaban Take 1 tablet (5 mg total) by mouth 2 (two) times daily.   escitalopram 5 MG tablet Commonly known as: LEXAPRO Take 1 tablet (5 mg total) by mouth daily. Start taking on: October 10, 2022   gabapentin 300 MG capsule Commonly known as: NEURONTIN Take 2 capsules (600 mg total) by mouth 3 (three) times daily. What changed:  medication strength how much to take   lisinopril 10 MG tablet Commonly known as: ZESTRIL Take 1 tablet (10 mg total) by mouth daily. Start taking on: October 10, 2022   oxyCODONE 5 MG immediate release  tablet Commonly known as: Oxy IR/ROXICODONE Take 1 tablet (5 mg total) by mouth every 8 (eight) hours as needed for severe pain.   traZODone 50 MG tablet Commonly known as: DESYREL Take 1 tablet (50 mg total) by mouth at bedtime as needed for sleep.   Ventolin HFA 108 (90 Base) MCG/ACT inhaler Generic drug: albuterol Inhale 2 puffs into the lungs every 6 (six) hours as needed for wheezing or shortness of breath.               Discharge Care Instructions  (From admission, onward)           Start     Ordered   10/09/22 0000  Discharge wound care:       Comments: Comments: Apply  a piece of calcium alginate [place] # [phone]) to left ankle wound Q day, then cover with foam dressing. Moisten previous dressing with NS to remove. (Change foam dressing Q 3 days or PRN soiling.)   10/09/22 1345   10/09/22 0000  Leave dressing on - Keep it clean, dry, and intact until clinic visit        10/09/22 1345            Discharge Assessment: Vitals:   10/09/22 0630 10/09/22 0837  BP: 134/84 (!) 150/85  Pulse: 89 72  Resp: 18   Temp: 98.3 F (36.8 C) 97.8 F (36.6 C)  SpO2:  99% 98%   Skin clean, dry and intact without evidence of skin break down, no evidence of skin tears noted. IV catheter discontinued intact. Site without signs and symptoms of complications - no redness or edema noted at insertion site, patient denies c/o pain - only slight tenderness at site.  Dressing with slight pressure applied.  D/c Instructions-Education: Discharge instructions given to patient/family with verbalized understanding. D/c education completed with patient/family including follow up instructions, medication list, d/c activities limitations if indicated, with other d/c instructions as indicated by MD - patient able to verbalize understanding, all questions fully answered. Patient instructed to return to ED, call 911, or call MD for any changes in condition.  Patient escorted via WC, and D/C  home via private auto.  Kizzie Bane, RN 10/09/2022 3:03 PM

## 2022-10-09 NOTE — TOC Progression Note (Signed)
Transition of Care [facility]) - Initial/Assessment Note    Patient Details  Name: [name] MRN: [id number] Date of Birth: 01/22/1966  Transition of Care [facility]) CM/SW Contact:    Milinda Antis, [place] Phone Number: 10/09/2022, 2:30 PM  Clinical Narrative:                 Patient came to [name]'s office and asked that [name] call [name], a social worker that the patient was working with at the [employer] to ask about getting the patient "back on the program" as patient is ready to discharge today.Marland Kitchen  [name] contacted [name] and was informed that she was an Theatre manager at [employer] last year and was working with the patient to get an ID, phone, and other resources in the community.  [name] informed [name] that she did not have any housing leads for the patient.  [name] reported that she encouraged the patient to go back to [employer] a few months ago to receive services, but the client has not been.    [name] met with the patient at bedside and informed patient of the above information.  The patient reports that he is ready to discharge today and will go to the park.  [name] asked the patient about willingness to go to a hotel as he makes $900 a month.  The patient reported that he will think about it but is ready to discharge.    [name] updated MD.  MD working on discharge and patient asked to see [name].  Patient reports that he is ready to go now.  [name] encouraged the patient to wait for his medications prior to leaving.  [name] contacted [facility] and verified that they are working on the patient's medication at this time.    Expected Discharge Plan: Skilled Nursing Facility Barriers to Discharge: [facility] (PASRR), Insurance Authorization, SNF Pending bed offer, Active Substance Use - Placement, Homeless with medical needs   Patient Goals and CMS Choice Patient states their goals for this hospitalization and ongoing recovery are:: Rehab CMS Medicare.gov  Compare Post Acute Care list provided to:: Patient Choice offered to / list presented to : Patient  Expected Discharge Plan and Services Expected Discharge Plan: [place] In-house Referral: Clinical Social Work   Post Acute Care Choice: [place] Living arrangements for the past 2 months: Homeless Expected Discharge Date: 10/09/22                                    Prior Living Arrangements/Services Living arrangements for the past 2 months: Homeless Lives with:: Self Patient language and need for interpreter reviewed:: Yes Do you feel safe going back to the place where you live?: Yes      Need for Family Participation in Patient Care: No (Comment) Care giver support system in place?: No (comment) Current home services: DME Administrator, Civil Service) Criminal Activity/Legal Involvement Pertinent to Current Situation/Hospitalization: No - Comment as needed  Activities of Daily Living   ADL Screening (condition at time of admission) Patient's cognitive ability adequate to safely complete daily activities?: Yes Is the patient deaf or have difficulty hearing?: No Does the patient have difficulty seeing, even when wearing glasses/contacts?: No Does the patient have difficulty concentrating, remembering, or making decisions?: No Patient able to express need for assistance with ADLs?: Yes Does the patient have difficulty dressing or bathing?: No Independently performs ADLs?:  Yes (appropriate for developmental age) Does the patient have difficulty walking or climbing stairs?: Yes Weakness of Legs: Both Weakness of Arms/Hands: None  Permission Sought/Granted                  Emotional Assessment Appearance:: Appears stated age     Orientation: : Oriented to Self, Oriented to Place, Oriented to  Time, Oriented to Situation Alcohol / Substance Use: Illicit Drugs Psych Involvement: Yes (comment)  Admission diagnosis:  Pulmonary emboli (HCC)  [I26.99] Acute pulmonary embolism, unspecified pulmonary embolism type, unspecified whether acute cor pulmonale present (HCC) [I26.99] Patient Active Problem List   Diagnosis Date Noted   Pulmonary emboli ([place]) 08/17/2022   Venous ulcer ([place]) 07/23/2022   Sinus bradycardia 07/23/2022   Anemia of chronic disease 07/23/2022   S/P AKA (above knee amputation), right ([place]) 07/23/2022   Acute on chronic systolic congestive heart failure ([place]) 07/12/2022   History of DVT (deep vein thrombosis) 07/12/2022   CAP (community acquired pneumonia) 07/12/2022   Chronic venous hypertension (idiopathic) with ulcer and inflammation of bilateral lower extremity ([place])    Sepsis due to cellulitis ([place]) 04/04/2022   Normocytic anemia 04/04/2022   Acute deep vein thrombosis (DVT) of brachial vein ([place]) 03/31/2022   Swelling of lower extremity 03/23/2022   SIRS (systemic inflammatory response syndrome) ([place]) 03/23/2022   Leg swelling 03/23/2022   Acute on chronic systolic heart failure ([place]) 02/26/2022   Seizure ([place]) 02/26/2022   Venous stasis ulcer of right ankle limited to breakdown of skin with varicose veins ([place])    Hypertensive urgency 01/29/2022   Prolonged QT interval 01/29/2022   Alcohol abuse 01/29/2022   chronic ulcers 01/29/2022   Wound infection 12/04/2021   Open wounds involving multiple regions of lower extremity 12/03/2021   Mixed diabetic hyperlipidemia associated with type 2 diabetes mellitus ([place]) 12/03/2021   Cellulitis of right lower extremity 11/13/2021   Type 2 diabetes mellitus with diabetic polyneuropathy, without long-term current use of insulin ([place]) 11/13/2021   Antisocial personality disorder ([place]) 11/04/2021   Malingering 11/04/2021   Foot pain 04/23/2021   Chronic combined systolic and diastolic congestive heart failure ([place]) 03/02/2021   History of stroke 03/02/2021   Polysubstance abuse ([place]) 03/02/2021   Skin ulcer of right great toe with necrosis of bone ([place])     Encounter for assessment of healthcare decision-making capacity    Pain, dental    Dystonia    Dysarthria    Shock liver 76/73/4193   Acute metabolic encephalopathy 79/01/4096   Coronary artery disease involving native coronary artery of native heart without angina pectoris 01/04/2021   Essential hypertension 01/04/2021   Bipolar I disorder ([place]) 01/04/2021   Cocaine abuse ([place]) 01/04/2021   Homelessness 01/04/2021   HLD (hyperlipidemia) 01/04/2021   Osteomyelitis of great toe (HCC)    Ulcer of great toe, left, with necrosis of bone (HCC)    Ulcer of great toe, right, with necrosis of muscle (HCC)    Acute osteomyelitis of toe, left (HCC)    Acute on chronic combined systolic and diastolic CHF (congestive heart failure) ([place]) 12/20/2020   Respiratory failure ([place])    Ischemic stroke ([place]) 12/17/2020   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   Diabetic foot ulcer ([place]) 12/08/2020   CHF (congestive heart failure) ([place]) 11/14/2020   CHF exacerbation ([place]) 11/13/2020   Hypokalemia    Hyperlipidemia    Acute hypoxemic respiratory failure ([place]) 10/26/2020   COPD with acute exacerbation ([place]) 10/14/2020   Acute on chronic  combined systolic and diastolic CHF (congestive heart failure) ([place]) 10/02/2020   Heart failure with reduced ejection fraction and diastolic dysfunction ([place]) 09/07/2020   Near syncope 07/21/2020   Acid reflux 06/03/2020   Cannabis abuse 03/19/2020   Acute bronchitis due to Rhinovirus 09/08/2019   Schizoaffective disorder ([place]) 06/24/2019   SOB (shortness of breath)    QT prolongation 05/25/2019   Cocaine abuse (HCC)    Elevated troponin    Homeless    Suicidal ideation    Bilateral lower extremity pain 04/05/2019   Housing situation unstable 03/28/2019   Bilateral lower extremity edema    Acute exacerbation of CHF (congestive heart failure) ([place]) 03/04/2019   Alcohol withdrawal syndrome, with delirium (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[place]) 03/02/2018   Mild intermittent asthma without complication 03/49/6116   Asthma 12/26/2016   Tobacco use disorder 12/26/2016   Schizophrenia ([place]) 12/26/2016   Severe episode of recurrent major depressive disorder, with psychotic features ([place]) 10/31/2016   Cocaine abuse with cocaine-induced mood disorder ([place]) 09/06/2016   Alcohol use disorder, mild, abuse 07/31/2016   Substance induced mood disorder ([place]) 07/30/2016   Schizoaffective disorder, bipolar type ([place]y) 05/31/2016   Cocaine use disorder, severe, dependence ([place]) 05/31/2016   Major depressive disorder, recurrent episode ([place]) 05/30/2016   Suicidal behavior with attempted self-injury ([place]) 05/08/2016   Acetaminophen overdose 05/06/2016   PCP:  Marliss Coots, NP Pharmacy:   [facility] [address] [place] [address] Phone: [phone] Fax: [place] [address] Phone: [phone] Fax: [phone]     Social Determinants of Health (SDOH) Interventions    Readmission Risk Interventions    08/18/2022    9:00 AM 07/18/2022    4:11 PM 04/08/2022    1:43 PM  Readmission Risk Prevention Plan  Transportation Screening Complete Complete Complete  Medication Review (RN Care Manager) Complete Complete Complete  PCP or Specialist appointment within 3-5 days of discharge Complete Complete Complete  HRI or Home Care Consult Complete Complete Not Complete  HRI or Home Care Consult Pt Refusal Comments   Pt is homeless  SW Recovery Care/Counseling Consult Complete Complete Complete  Palliative Care Screening Not Applicable  Not [place] Not Applicable Complete Not Applicable

## 2022-10-09 NOTE — Discharge Summary (Signed)
Physician Discharge Summary  [name] WUX:[id number] DOB: 06/22/1966 DOA: 08/16/2022  PCP: [name], NP  Admit date: 08/16/2022 Discharge date: 10/09/2022  Time spent: 56 minutes  Recommendations for Outpatient Follow-up:  Patient will need outpatient follow-up as able he is homeless and this complicates his care I have given him medication to last 3 months and much-he states that he will follow-up at [facility] outpatient pharmacy to get refill It has been made absolutely clear that he needs to take his blood thinner for pulmonary embolism or it could  be lethal to him--- he understands and is of sound mind on discharge Get recommended CBC and differential as well as Chem-12 in about 1 week postdischarge if able  Discharge Diagnoses:  MAIN problem for hospitalization   Pulmonary embolism with right lung infarct suicidal ideation cleared by psychiatry Prior CVA Left medial malleolar ulcer with 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 peripheral neuropathy   Please see below for itemized issues addressed in HOpsital- refer to other progress notes for clarity if needed  Discharge Condition: Guarded  Diet recommendation: Diabetic  Filed Weights   09/11/22 0502 09/17/22 2133 10/02/22 2105  Weight: 75.4 kg 75.4 kg 80 kg    History of present illness:  56 year old black male with HFpEF prior CVA with right hemiparesis and droop, right AKA left great toe amputation Prior thromboembolism history noncompliant on anticoagulation polysubstance abuse with cocaine bipolar Prior admission 8/12 --8/25 with acute systolic heart failure Represented 9/16 where shortness of breath and chest pain he has not taken his Eliquis for 2 weeks for treatment of his DVT  Hospital Course:  Pulmonary embolism right lung infarct Recurrent secondary to noncompliance-he has been counseled carefully about using Eliquis continuously and not missing I have warned him carefully about the  complications including death of recurrent PE and told him to get his meds  Euvolemic HFpEF HFrEF We stopped his Lasix but he will continue his Coreg as well as his lisinopril and amlodipine   suicidal ideation Last seen by psychiatry on 9/29-he was cleared by them and he was placed on various meds including trazodone Lexapro and Abilify-he does not have ideation I confirmed this with him he is competent and seems to have capacity and the plan although this may not be the safest plan  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 and peripheral neuropathy Continues on Eliquis twice daily Refilling his Neurontin 600 3 times daily  DM TY 2 A1c 6.0 He was covered with sliding scale during hospitalization he may require initiation of meds if he is willing to use  COPD without exacerbation Discharged on inhalers  Left lateral malleoli wound and right lateral malleoli old wound Both wounds look healed on my exam today I will have him wet-to-dry dressing as the prior dressings applied alginate no longer seem to apply-he looks quite stable   Discharge Exam: Vitals:   10/09/22 0630 10/09/22 0837  BP: 134/84 (!) 150/85  Pulse: 89 72  Resp: 18   Temp: 98.3 F (36.8 C) 97.8 F (36.6 C)  SpO2: 99% 98%    Subj on day of d/c   EOMI NCAT no focal deficit coherent speaking clearly can tell me month date year as well as president and seems to have an understanding about his medical issues Tells me he will discharge go to the park but has a friend who can get him into a hotel and he may be able to stay there  General Exam on discharge  EOMI NCAT no focal deficit no icterus no pallor no rales no rhonchi no wheeze CTA B no added sound S1-S2 no murmur Abdomen soft no rebound no guarding Left lower extremity examined as above in narrative  Discharge Instructions   Discharge Instructions     Diet - low sodium heart healthy   Complete by: As directed    Discharge instructions    Complete by: As directed    Make sure that you take all your medications as directed--- you will need specifically to take your blood thinner and your psychiatric medications and ensure that you seek medical attention if the area on your leg looks worse.  You should not really need specific attention to that area unless it starts to drain Please follow-up with your regular doctor and get further care as needed   Discharge wound care:   Complete by: As directed    Comments: Apply  a piece of calcium alginate [place] # [phone]) to left ankle wound Q day, then cover with foam dressing. Moisten previous dressing with NS to remove. (Change foam dressing Q 3 days or PRN soiling.)   Increase activity slowly   Complete by: As directed    Leave dressing on - Keep it clean, dry, and intact until clinic visit   Complete by: As directed       Allergies as of 10/09/2022       Reactions   Motrin [ibuprofen] Anaphylaxis   Tylenol [acetaminophen] Hives, Nausea And Vomiting, Rash   Lyrica Cr [pregabalin Er] Other (See Comments)   Dystonia Dysarthria    Peanut-containing Drug Products Other (See Comments)   Pt denies allergy to peanut butter 03/02/2021        Medication List     STOP taking these medications    folic acid 1 MG tablet Commonly known as: FOLVITE   furosemide 40 MG tablet Commonly known as: LASIX   potassium chloride SA 20 MEQ tablet Commonly known as: KLOR-CON M   thiamine 100 MG tablet Commonly known as: VITAMIN B1       TAKE these medications    amLODipine 10 MG tablet Commonly known as: NORVASC Take 1 tablet (10 mg total) by mouth daily. Start taking on: October 10, 2022   apixaban 5 MG Tabs tablet Commonly known as: ELIQUIS Take 1 tablet (5 mg total) by mouth 2 (two) times daily.   ARIPiprazole 10 MG tablet Commonly known as: ABILIFY Take 1 tablet (10 mg total) by mouth daily. Start taking on: October 10, 2022 What changed:  medication strength how much  to take   carvedilol 3.125 MG tablet Commonly known as: COREG Take 1 tablet (3.125 mg total) by mouth 2 (two) times daily with a meal.   escitalopram 5 MG tablet Commonly known as: LEXAPRO Take 1 tablet (5 mg total) by mouth daily. Start taking on: October 10, 2022   gabapentin 300 MG capsule Commonly known as: NEURONTIN Take 2 capsules (600 mg total) by mouth 3 (three) times daily. What changed:  medication strength how much to take   lisinopril 10 MG tablet Commonly known as: ZESTRIL Take 1 tablet (10 mg total) by mouth daily. Start taking on: October 10, 2022   oxyCODONE 5 MG immediate release tablet Commonly known as: Oxy IR/ROXICODONE Take 1 tablet (5 mg total) by mouth every 8 (eight) hours as needed for severe pain.   traZODone 50 MG tablet Commonly known as: DESYREL Take 1 tablet (50 mg  total) by mouth at bedtime as needed for sleep.   Ventolin HFA 108 (90 Base) MCG/ACT inhaler Generic drug: albuterol Inhale 2 puffs into the lungs every 6 (six) hours as needed for wheezing or shortness of breath.               Discharge Care Instructions  (From admission, onward)           Start     Ordered   10/09/22 0000  Discharge wound care:       Comments: Comments: Apply  a piece of calcium alginate [place] # [phone]) to left ankle wound Q day, then cover with foam dressing. Moisten previous dressing with NS to remove. (Change foam dressing Q 3 days or PRN soiling.)   10/09/22 1345   10/09/22 0000  Leave dressing on - Keep it clean, dry, and intact until clinic visit        10/09/22 1345           Allergies  Allergen Reactions   Motrin [Ibuprofen] Anaphylaxis   Tylenol [Acetaminophen] Hives, Nausea And Vomiting and Rash   Lyrica Cr [Pregabalin Er] Other (See Comments)    Dystonia Dysarthria    Peanut-Containing Drug Products Other (See Comments)    Pt denies allergy to peanut butter 03/02/2021    Follow-up Information     [name], Chales Abrahams, NP  Follow up.   Why: at [facility] make appointment for follow up Contact information: [address] [name] [address] [phone]                  The results of significant diagnostics from this hospitalization (including imaging, microbiology, ancillary and laboratory) are listed below for reference.    Significant Diagnostic Studies: No results found.  Microbiology: No results found for this or any previous visit (from the past 240 hour(s)).   Labs: Basic Metabolic Panel: Recent Labs  Lab 10/09/22 0001  NA 140  K 3.8  CL 104  CO2 25  GLUCOSE 97  BUN 21*  CREATININE 1.02  CALCIUM 9.4  MG 1.9   Liver Function Tests: No results for input(s): "AST", "ALT", "ALKPHOS", "BILITOT", "PROT", "ALBUMIN" in the last 168 hours. No results for input(s): "LIPASE", "AMYLASE" in the last 168 hours. No results for input(s): "AMMONIA" in the last 168 hours. CBC: Recent Labs  Lab 10/09/22 0001  WBC 6.4  HGB 13.7  HCT 41.7  MCV 90.1  PLT 245   Cardiac Enzymes: No results for input(s): "CKTOTAL", "CKMB", "CKMBINDEX", "TROPONINI" in the last 168 hours. BNP: BNP (last 3 results) Recent Labs    07/12/22 0812 07/22/22 0500 08/16/22 1542  BNP 2,089.0* 611.6* 1,462.5*    ProBNP (last 3 results) No results for input(s): "PROBNP" in the last 8760 hours.  CBG: Recent Labs  Lab 10/08/22 1123 10/08/22 1633 10/08/22 2040 10/09/22 0735 10/09/22 1122  GLUCAP 154* 153* 124* 114* 102*       Signed:  Rhetta Mura MD   [facility] 10/09/2022, 1:46 PM

## 2022-11-03 ENCOUNTER — Emergency Department (HOSPITAL_COMMUNITY)
Admission: EM | Admit: 2022-11-03 | Discharge: 2022-11-03 | Disposition: A | Discharge status: Home / Self Care | Payer: Medicaid Other | Attending: Emergency Medicine | Admitting: Emergency Medicine

## 2022-11-03 DIAGNOSIS — Z5900 Homelessness unspecified: Secondary | ICD-10-CM | POA: Diagnosis not present

## 2022-11-03 DIAGNOSIS — Z86718 Personal history of other venous thrombosis and embolism: Secondary | ICD-10-CM | POA: Diagnosis not present

## 2022-11-03 DIAGNOSIS — R45851 Suicidal ideations: Secondary | ICD-10-CM | POA: Insufficient documentation

## 2022-11-03 DIAGNOSIS — F1414 Cocaine abuse with cocaine-induced mood disorder: Secondary | ICD-10-CM | POA: Insufficient documentation

## 2022-11-03 DIAGNOSIS — Z91010 Allergy to peanuts: Secondary | ICD-10-CM | POA: Diagnosis not present

## 2022-11-03 DIAGNOSIS — Z20822 Contact with and (suspected) exposure to covid-19: Secondary | ICD-10-CM | POA: Diagnosis not present

## 2022-11-03 DIAGNOSIS — Z7901 Long term (current) use of anticoagulants: Secondary | ICD-10-CM | POA: Insufficient documentation

## 2022-11-03 DIAGNOSIS — J101 Influenza due to other identified influenza virus with other respiratory manifestations: Secondary | ICD-10-CM | POA: Insufficient documentation

## 2022-11-03 DIAGNOSIS — E119 Type 2 diabetes mellitus without complications: Secondary | ICD-10-CM | POA: Diagnosis not present

## 2022-11-03 DIAGNOSIS — F32A Depression, unspecified: Secondary | ICD-10-CM | POA: Diagnosis present

## 2022-11-03 LAB — CBC WITH DIFFERENTIAL/PLATELET
Abs Immature Granulocytes: 0.01 10*3/uL (ref 0.00–0.07)
Basophils Absolute: 0 10*3/uL (ref 0.0–0.1)
Basophils Relative: 1 %
Eosinophils Absolute: 0 10*3/uL (ref 0.0–0.5)
Eosinophils Relative: 1 %
HCT: 40.3 % (ref 39.0–52.0)
Hemoglobin: 13.4 g/dL (ref 13.0–17.0)
Immature Granulocytes: 0 %
Lymphocytes Relative: 53 %
Lymphs Abs: 2.6 10*3/uL (ref 0.7–4.0)
MCH: 30.4 pg (ref 26.0–34.0)
MCHC: 33.3 g/dL (ref 30.0–36.0)
MCV: 91.4 fL (ref 80.0–100.0)
Monocytes Absolute: 0.5 10*3/uL (ref 0.1–1.0)
Monocytes Relative: 11 %
Neutro Abs: 1.7 10*3/uL (ref 1.7–7.7)
Neutrophils Relative %: 34 %
Platelets: 248 10*3/uL (ref 150–400)
RBC: 4.41 MIL/uL (ref 4.22–5.81)
RDW: 17.8 % — ABNORMAL HIGH (ref 11.5–15.5)
WBC: 4.9 10*3/uL (ref 4.0–10.5)
nRBC: 0 % (ref 0.0–0.2)

## 2022-11-03 LAB — ETHANOL: Alcohol, Ethyl (B): <10 mg/dL (ref <10)

## 2022-11-03 LAB — SALICYLATE LEVEL: Salicylate Lvl: <7 mg/dL — ABNORMAL LOW (ref 7.0–30.0)

## 2022-11-03 LAB — COMPREHENSIVE METABOLIC PANEL
ALT: 16 U/L (ref 0–44)
AST: 27 U/L (ref 15–41)
Albumin: 3.1 g/dL — ABNORMAL LOW (ref 3.5–5.0)
Alkaline Phosphatase: 62 U/L (ref 38–126)
Anion gap: 9 (ref 5–15)
BUN: 13 mg/dL (ref 6–20)
CO2: 24 mmol/L (ref 22–32)
Calcium: 8.6 mg/dL — ABNORMAL LOW (ref 8.9–10.3)
Chloride: 104 mmol/L (ref 98–111)
Creatinine, Ser: 0.93 mg/dL (ref 0.61–1.24)
GFR, Estimated: >60 mL/min (ref >60)
Glucose, Bld: 109 mg/dL — ABNORMAL HIGH (ref 70–99)
Potassium: 3.6 mmol/L (ref 3.5–5.1)
Sodium: 137 mmol/L (ref 135–145)
Total Bilirubin: 0.6 mg/dL (ref 0.3–1.2)
Total Protein: 6.8 g/dL (ref 6.5–8.1)

## 2022-11-03 LAB — ACETAMINOPHEN LEVEL: Acetaminophen (Tylenol), Serum: <10 ug/mL — ABNORMAL LOW (ref 10–30)

## 2022-11-03 LAB — RESP PANEL BY RT-PCR (FLU A&B, COVID) ARPGX2
Influenza A by PCR: POSITIVE — AB
Influenza B by PCR: NEGATIVE
SARS Coronavirus 2 by RT PCR: NEGATIVE

## 2022-11-03 MED ORDER — TRAZODONE HCL 50 MG PO TABS: 50.0000 mg | ORAL_TABLET | Freq: Every evening | ORAL | Status: DC | PRN

## 2022-11-03 MED ORDER — AMLODIPINE BESYLATE 5 MG PO TABS
10.0000 mg | ORAL_TABLET | Freq: Every day | ORAL | Status: DC
  Administered 2022-11-03: 10 mg via ORAL
  Filled 2022-11-03: qty 2

## 2022-11-03 MED ORDER — ALBUTEROL SULFATE HFA 108 (90 BASE) MCG/ACT IN AERS: 2.0000 | INHALATION_SPRAY | Freq: Four times a day (QID) | RESPIRATORY_TRACT | Status: DC | PRN

## 2022-11-03 MED ORDER — GABAPENTIN 300 MG PO CAPS
600.0000 mg | ORAL_CAPSULE | Freq: Three times a day (TID) | ORAL | Status: DC
  Administered 2022-11-03: 600 mg via ORAL
  Filled 2022-11-03: qty 2

## 2022-11-03 MED ORDER — ESCITALOPRAM OXALATE 10 MG PO TABS
5.0000 mg | ORAL_TABLET | Freq: Every day | ORAL | Status: DC
  Administered 2022-11-03: 5 mg via ORAL
  Filled 2022-11-03: qty 1

## 2022-11-03 MED ORDER — APIXABAN 5 MG PO TABS
5.0000 mg | ORAL_TABLET | Freq: Two times a day (BID) | ORAL | Status: DC
  Administered 2022-11-03: 5 mg via ORAL
  Filled 2022-11-03: qty 1

## 2022-11-03 MED ORDER — ARIPIPRAZOLE 10 MG PO TABS
10.0000 mg | ORAL_TABLET | Freq: Every day | ORAL | Status: DC
  Administered 2022-11-03: 10 mg via ORAL
  Filled 2022-11-03: qty 1

## 2022-11-03 MED ORDER — LISINOPRIL 10 MG PO TABS
10.0000 mg | ORAL_TABLET | Freq: Every day | ORAL | Status: DC
  Administered 2022-11-03: 10 mg via ORAL
  Filled 2022-11-03: qty 1

## 2022-11-03 MED ORDER — ALBUTEROL SULFATE (2.5 MG/3ML) 0.083% IN NEBU: 2.5000 mg | INHALATION_SOLUTION | Freq: Four times a day (QID) | RESPIRATORY_TRACT | Status: DC | PRN

## 2022-11-03 MED ORDER — CARVEDILOL 3.125 MG PO TABS
3.1250 mg | ORAL_TABLET | Freq: Two times a day (BID) | ORAL | Status: DC
  Administered 2022-11-03: 3.125 mg via ORAL
  Filled 2022-11-03: qty 1

## 2022-11-03 NOTE — Discharge Instructions (Signed)
Please note: Due to cold temperatures, shelters are currently "white flag" which mean they have no occupancy limit/can not turn you away to sleep at night.   Please present to [facility] for further assistance with hygiene, shelters, low income housing, and med clinic if needed.

## 2022-11-03 NOTE — ED Provider Notes (Signed)
And cleared by psychiatry   Lorre Nick, MD 11/03/22 1311

## 2022-11-03 NOTE — ED Notes (Signed)
TTS needed per RN.

## 2022-11-03 NOTE — Consult Note (Signed)
BH ED ASSESSMENT   Reason for Consult:  SI Referring Physician:  Allyne Gee, [place] Patient Identification: [name] MRN:  [id number] ED Chief Complaint: Cocaine abuse with cocaine-induced mood disorder (HCC)  Diagnosis:  Principal Problem:   Cocaine abuse with cocaine-induced mood disorder [facility])   ED Assessment Time Calculation: Start Time: 1200 Stop Time: 1245 Total Time in Minutes (Assessment Completion): 45   HPI:   [name] is a 56 y.o. male patient who presented with complaints of suicidal ideations and homelessness. He originally reported plan to roll wheelchair into traffic. Denies HI. Denies AVH. Does endorse frequent cocaine use. 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   Subjective:   Patient seen at [facility] ED for face to face evaluation.  Patient tells me he is feeling the same today and very frustrated with his current situation patient endorses chronic pain in his left leg specifically around his foot where he has open sores.  Patient does report being homeless and has difficulty keeping up with hygiene and keeping the sores clean.  I tried to speak to patient about resources in the community he can utilize such as the [facility] which allows him access to showers, meals, electronics, etc.  Patient stated "they don't help you there is no point in going in there." I attempted to ask the patient when the last time he went was and if he has been staying at any shelters recently and he became irritable, not wanting to engage in assessment, and stating "I'm saying I'm suicidal so you have to keep me here so just let me be." Pt continues to endorse suicidal ideations, however denies any plan or intent. Denies homicidal ideations. Denies auditory or visual hallucinations. He will not engage with me when I try to discuss community resources, specifically for homeless adults. Pt denies being compliant with prescription  medications, and reports daily cocaine use. Attempted to discuss effects of cocaine use on mental and physical health but patient did not want to engage. Pt endorses being homeless for many years, denies any family or friends. Denies access to weapons or firearms.   Pt discussed he was in the hospital for months, I do see where he was admitted to [facility] for acute pulmonary embolism from 08/16/22 to 10/09/2022. Pt reports difficulty adjusting to being homeless after a two month stay in the hospital. He reports suicidal ideations are directly due to being homeless and relapse on cocaine. Attempted to speak to patient about possible residential treatment facilities, he declined. Pt was not very cooperative when trying to discuss different options such as substance abuse treatment or shelters that could help his homelessness. When I asked how the psychiatry team could best assist him if he wasn't interested in assistance from [facility] or substance abuse treatment he stated "just let me stay here and I need some pain medications."   Pt is able to engage in coherent and logical conversation. Speech is normal in rate and tone. No evidence of him responding to internal stimuli, no concerns for psychosis or mania at this time. While in the ED, he has demonstrated no unsafe behavior. There is no evidence of a psychiatric condition which is amendable to inpatient psychiatric hospitalization. Patient's SI appears to be conditional and is a means of manipulation without a true intention to harm self. His behavior is more consistent with someone who is acting in self-preservation, rather than someone who is acutely  suicidal. Will psychiatrically clear patient with resources for shelters and substance abuse treatment.    Past Psychiatric History:  MDD, polysubstance abuse  Risk to Self or Others: Is the patient at risk to self? No Has the patient been a risk to self in the past 6 months? No Has the patient been a risk to  self within the distant past? No Is the patient a risk to others? No Has the patient been a risk to others in the past 6 months? No Has the patient been a risk to others within the distant past? No  [place] Scale:  Flowsheet Row ED from 11/03/2022 in [facility] EMERGENCY DEPARTMENT ED to Hosp-Admission (Discharged) from 08/16/2022 in [facility] 48M KIDNEY UNIT ED to Hosp-Admission (Discharged) from 07/12/2022 in [facility] LONG 4TH FLOOR PROGRESSIVE CARE AND UROLOGY  C-SSRS RISK CATEGORY High Risk No Risk No Risk       Past Medical History:  Past Medical History:  Diagnosis Date   Asthma    Bipolar 1 disorder (HCC)    CHF (congestive heart failure) (HCC)    COPD (chronic obstructive pulmonary disease) (HCC)    Depression    Diabetes mellitus without complication (HCC)    GERD (gastroesophageal reflux disease)    Homelessness    Hypertension    QT prolongation    Schizoaffective disorder (HCC)     Past Surgical History:  Procedure Laterality Date   AMPUTATION TOE Bilateral 01/02/2021   Procedure: AMPUTATION TOE;  Surgeon: Park Liter, DPM;  Location: [facility] OR;  Service: Podiatry;  Laterality: Bilateral;  Ok to leave patient on hospital bed   IR ANGIO INTRA EXTRACRAN SEL COM CAROTID INNOMINATE BILAT MOD SED  12/17/2020   IR ANGIO VERTEBRAL SEL VERTEBRAL BILAT MOD SED  12/17/2020   RADIOLOGY WITH ANESTHESIA N/A 12/17/2020   Procedure: IR WITH ANESTHESIA;  Surgeon: Radiologist, Medication, MD;  Location: [facility] OR;  Service: Radiology;  Laterality: N/A;   Family History:  Family History  Problem Relation Age of Onset   Drug abuse Mother    Suicidality Mother    Social History:  Social History   Substance and Sexual Activity  Alcohol Use Yes     Social History   Substance and Sexual Activity  Drug Use Yes   Types: Cocaine    Social History   Socioeconomic History   Marital status: Single    Spouse name: Not on file   Number of children: 4   Years of  education: Not on file   Highest education level: Not on file  Occupational History   Occupation: N/A  Tobacco Use   Smoking status: Some Days    Years: 30.00    Types: Cigarettes   Smokeless tobacco: Never  Vaping Use   Vaping Use: Never used  Substance and Sexual Activity   Alcohol use: Yes   Drug use: Yes    Types: Cocaine   Sexual activity: Not Currently    Partners: Female    Birth control/protection: None  Other Topics Concern   Not on file  Social History Narrative   ** Merged History Encounter **       Social Determinants of Health   Financial Resource Strain: Not on file  Food Insecurity: Not on file  Transportation Needs: Not on file  Physical Activity: Not on file  Stress: Not on file  Social Connections: Not on file   Additional Social History:    Allergies:   Allergies  Allergen  Reactions   Motrin [Ibuprofen] Anaphylaxis   Tylenol [Acetaminophen] Hives, Nausea And Vomiting and Rash   Lyrica Cr [Pregabalin Er] Other (See Comments)    Dystonia Dysarthria    Peanut-Containing Drug Products Other (See Comments)    Pt denies allergy to peanut butter 03/02/2021    Labs:  Results for orders placed or performed during the hospital encounter of 11/03/22 (from the past 48 hour(s))  CBC with Differential     Status: Abnormal   Collection Time: 11/03/22  2:00 AM  Result Value Ref Range   WBC 4.9 4.0 - 10.5 K/uL   RBC 4.41 4.22 - 5.81 MIL/uL   Hemoglobin 13.4 13.0 - 17.0 g/dL   HCT 16.1 09.6 - 04.5 %   MCV 91.4 80.0 - 100.0 fL   MCH 30.4 26.0 - 34.0 pg   MCHC 33.3 30.0 - 36.0 g/dL   RDW 40.9 (H) 81.1 - 91.4 %   Platelets 248 150 - 400 K/uL   nRBC 0.0 0.0 - 0.2 %   Neutrophils Relative % 34 %   Neutro Abs 1.7 1.7 - 7.7 K/uL   Lymphocytes Relative 53 %   Lymphs Abs 2.6 0.7 - 4.0 K/uL   Monocytes Relative 11 %   Monocytes Absolute 0.5 0.1 - 1.0 K/uL   Eosinophils Relative 1 %   Eosinophils Absolute 0.0 0.0 - 0.5 K/uL   Basophils Relative 1 %    Basophils Absolute 0.0 0.0 - 0.1 K/uL   Immature Granulocytes 0 %   Abs Immature Granulocytes 0.01 0.00 - 0.07 K/uL    Comment: Performed at [facility], [address], [place], [name] [address]  Comprehensive metabolic panel     Status: Abnormal   Collection Time: 11/03/22  2:00 AM  Result Value Ref Range   Sodium 137 135 - 145 mmol/L   Potassium 3.6 3.5 - 5.1 mmol/L   Chloride 104 98 - 111 mmol/L   CO2 24 22 - 32 mmol/L   Glucose, Bld 109 (H) 70 - 99 mg/dL    Comment: Glucose reference range applies only to samples taken after fasting for at least 8 hours.   BUN 13 6 - 20 mg/dL   Creatinine, Ser 6.21 0.61 - 1.24 mg/dL   Calcium 8.6 (L) 8.9 - 10.3 mg/dL   Total Protein 6.8 6.5 - 8.1 g/dL   Albumin 3.1 (L) 3.5 - 5.0 g/dL   AST 27 15 - 41 U/L   ALT 16 0 - 44 U/L   Alkaline Phosphatase 62 38 - 126 U/L   Total Bilirubin 0.6 0.3 - 1.2 mg/dL   GFR, Estimated >30 >86 mL/min    Comment: (NOTE) Calculated using the CKD-EPI Creatinine Equation (2021)    Anion gap 9 5 - 15    Comment: Performed at [facility], [address] [place], [name] [address]  Ethanol     Status: None   Collection Time: 11/03/22  2:00 AM  Result Value Ref Range   Alcohol, Ethyl (B) <10 <10 mg/dL    Comment: (NOTE) Lowest detectable limit for serum alcohol is 10 mg/dL.  For medical purposes only. Performed at [facility], [address], [place], [name] [address]   Salicylate level     Status: Abnormal   Collection Time: 11/03/22  2:00 AM  Result Value Ref Range   Salicylate Lvl <7.0 (L) 7.0 - 30.0 mg/dL    Comment: Performed at [facility], [address]  [place], [name] [address]  Acetaminophen level     Status: Abnormal   Collection Time: 11/03/22  2:00 AM  Result Value Ref Range   Acetaminophen (Tylenol), Serum <10 (L) 10 - 30 ug/mL    Comment: (NOTE) Therapeutic concentrations vary significantly. A range of 10-30 ug/mL  may be an effective concentration for many  patients. However, some  are best treated at concentrations outside of this range. Acetaminophen concentrations >150 ug/mL at 4 hours after ingestion  and >50 ug/mL at 12 hours after ingestion are often associated with  toxic reactions.  Performed at [facility], [address], [place], [name] [address]   Resp Panel by RT-PCR (Flu A&B, Covid) Anterior Nasal Swab     Status: Abnormal   Collection Time: 11/03/22  2:24 AM   Specimen: Anterior Nasal Swab  Result Value Ref Range   SARS Coronavirus 2 by RT PCR NEGATIVE NEGATIVE    Comment: (NOTE) SARS-CoV-2 target nucleic acids are NOT DETECTED.  The SARS-CoV-2 RNA is generally detectable in upper respiratory specimens during the acute phase of infection. The lowest concentration of SARS-CoV-2 viral copies this assay can detect is 138 copies/mL. A negative result does not preclude SARS-Cov-2 infection and should not be used as the sole basis for treatment or other patient management decisions. A negative result may occur with  improper specimen collection/handling, submission of specimen other than nasopharyngeal swab, presence of viral mutation(s) within the areas targeted by this assay, and inadequate number of viral copies(<138 copies/mL). A negative result must be combined with clinical observations, patient history, and epidemiological information. The expected result is Negative.  Fact Sheet for Patients:  BloggerCourse.com  Fact Sheet for Healthcare Providers:  SeriousBroker.it  This test is no t yet approved or cleared by the [place] FDA and  has been authorized for detection and/or diagnosis of SARS-CoV-2 by FDA under an Emergency Use Authorization (EUA). This EUA will remain  in effect (meaning this test can be used) for the duration of the COVID-19 declaration under Section 564(b)(1) of the Act, 21 U.S.C.section 360bbb-3(b)(1), unless the authorization is  terminated  or revoked sooner.       Influenza A by PCR POSITIVE (A) NEGATIVE   Influenza B by PCR NEGATIVE NEGATIVE    Comment: (NOTE) The Xpert Xpress SARS-CoV-2/FLU/RSV plus assay is intended as an aid in the diagnosis of influenza from Nasopharyngeal swab specimens and should not be used as a sole basis for treatment. Nasal washings and aspirates are unacceptable for Xpert Xpress SARS-CoV-2/FLU/RSV testing.  Fact Sheet for Patients: BloggerCourse.com  Fact Sheet for Healthcare Providers: SeriousBroker.it  This test is not yet approved or cleared by the [place] FDA and has been authorized for detection and/or diagnosis of SARS-CoV-2 by FDA under an Emergency Use Authorization (EUA). This EUA will remain in effect (meaning this test can be used) for the duration of the COVID-19 declaration under Section 564(b)(1) of the Act, 21 U.S.C. section 360bbb-3(b)(1), unless the authorization is terminated or revoked.  Performed at [facility], [address] [place], [name] [address]     Current Facility-Administered Medications  Medication Dose Route Frequency Provider Last Rate Last Admin   albuterol (PROVENTIL) (2.5 MG/3ML) 0.083% nebulizer solution 2.5 mg  2.5 mg Nebulization Q6H PRN Mesner, Barbara Cower, MD       amLODipine (NORVASC) tablet 10 mg  10 mg Oral Daily Garlon Hatchet, PA-C   10 mg at 11/03/22 0901   apixaban (ELIQUIS) tablet 5  mg  5 mg Oral BID Garlon Hatchet, PA-C   5 mg at 11/03/22 0901   ARIPiprazole (ABILIFY) tablet 10 mg  10 mg Oral Daily Garlon Hatchet, PA-C   10 mg at 11/03/22 0901   carvedilol (COREG) tablet 3.125 mg  3.125 mg Oral BID WC Garlon Hatchet, PA-C   3.125 mg at 11/03/22 0901   escitalopram (LEXAPRO) tablet 5 mg  5 mg Oral Daily Garlon Hatchet, PA-C   5 mg at 11/03/22 0901   gabapentin (NEURONTIN) capsule 600 mg  600 mg Oral TID Garlon Hatchet, PA-C   600 mg at 11/03/22 0901    lisinopril (ZESTRIL) tablet 10 mg  10 mg Oral Daily Garlon Hatchet, PA-C   10 mg at 11/03/22 0901   traZODone (DESYREL) tablet 50 mg  50 mg Oral QHS PRN Garlon Hatchet, PA-C       Current Outpatient Medications  Medication Sig Dispense Refill   albuterol (VENTOLIN HFA) 108 (90 Base) MCG/ACT inhaler Inhale 2 puffs into the lungs every 6 (six) hours as needed for wheezing or shortness of breath. 6.7 g 2   amLODipine (NORVASC) 10 MG tablet Take 1 tablet (10 mg total) by mouth daily. 30 tablet 1   apixaban (ELIQUIS) 5 MG TABS tablet Take 1 tablet (5 mg total) by mouth 2 (two) times daily. 60 tablet 5   ARIPiprazole (ABILIFY) 10 MG tablet Take 1 tablet (10 mg total) by mouth daily. 30 tablet 1   carvedilol (COREG) 3.125 MG tablet Take 1 tablet (3.125 mg total) by mouth 2 (two) times daily with a meal. 60 tablet 3   escitalopram (LEXAPRO) 5 MG tablet Take 1 tablet (5 mg total) by mouth daily. 30 tablet 1   gabapentin (NEURONTIN) 300 MG capsule Take 2 capsules (600 mg total) by mouth 3 (three) times daily. 90 capsule 1   lisinopril (ZESTRIL) 10 MG tablet Take 1 tablet (10 mg total) by mouth daily. 30 tablet 1   oxyCODONE (OXY IR/ROXICODONE) 5 MG immediate release tablet Take 1 tablet (5 mg total) by mouth every 8 (eight) hours as needed for severe pain. 15 tablet 0   traZODone (DESYREL) 50 MG tablet Take 1 tablet (50 mg total) by mouth at bedtime as needed for sleep. 30 tablet 1   Psychiatric Specialty Exam: Presentation  General Appearance:  Appropriate for Environment  Eye Contact: Fair  Speech: Clear and Coherent  Speech Volume: Normal  Handedness: Right   Mood and Affect  Mood: Irritable  Affect: Congruent   Thought Process  Thought Processes: Coherent  Descriptions of Associations:Intact  Orientation:Full (Time, Place and Person)  Thought Content:Logical  History of Schizophrenia/Schizoaffective disorder:Yes  Duration of Psychotic Symptoms:Greater than six  months  Hallucinations:Hallucinations: None  Ideas of Reference:None  Suicidal Thoughts:Suicidal Thoughts: No  Homicidal Thoughts:Homicidal Thoughts: No   Sensorium  Memory: Immediate Fair; Recent Fair  Judgment: Fair  Insight: Fair   Art therapist  Concentration: Fair  Attention Span: Fair  Recall: Good  Fund of Knowledge: Good  Language: Good   Psychomotor Activity  Psychomotor Activity: Psychomotor Activity: Normal   Assets  Assets: Desire for Improvement; Leisure Time; Physical Health; Resilience    Sleep  Sleep: Sleep: Fair   Physical Exam: Physical Exam Neurological:     Mental Status: He is alert and oriented to person, place, and time.  Psychiatric:        Attention and Perception: Attention normal.  Mood and Affect: Affect is angry.        Speech: Speech normal.        Behavior: Behavior is cooperative.        Thought Content: Thought content normal.    Review of Systems  Constitutional:        Below knee amputation right leg, requires wheelchair  Musculoskeletal:        Lower leg pain  Endo/Heme/Allergies:        Open sores on right foot  Psychiatric/Behavioral:  Positive for depression and substance abuse. The patient has insomnia.   All other systems reviewed and are negative.  Blood pressure 119/75, pulse 90, temperature 97.9 F (36.6 C), temperature source Oral, resp. rate 20, height 5\' 9"  (1.753 m), weight 79 kg, SpO2 94 %. Body mass index is 25.72 kg/m.  Medical Decision Making: Pt case reviewed and discussed with Dr. . Pt does not meet criteria for IVC or inpatient psychiatric treatment. There are concerns for malingering due to chronic homelessness. Pt is able to contract for safety and agreeable with discharge if hospital is able to transport him to [facility] or shelter. Working with SW to help determine shelter availability. Pt is psychiatrically cleared.   - Will request ED provides transportation to  [facility] or shelter, as he is wheelchair bound and difficulty navigating bus system/unable to take cab -Resources in AVS include shelters, substance abuse treatment, OP follow up, mental health crisis  Disposition: No evidence of imminent risk to self or others at present.   Patient does not meet criteria for psychiatric inpatient admission. Supportive therapy provided about ongoing stressors. Discussed crisis plan, support from social network, calling 911, coming to the Emergency Department, and calling Suicide Hotline.  [facility], NP 11/03/2022 12:37 PM

## 2022-11-03 NOTE — ED Triage Notes (Signed)
[place] [facility] [place]. Patient is homeless and has been having suicidal thoughts. Patient has a plan which involves rolling himself out into traffic in his wheelchaor. Patient states that he is depressed even on a good day. He also has a wound on his left foot. He has a history of COPD, Diabetes, Right BKA, CHF, and stroke. His stroke has left him with deficits on the right side. He has a tremor, and slurred speech.

## 2022-11-03 NOTE — ED Notes (Signed)
Per conversation with social work, arrange for pt to go to the [facility].  [facility] arranged for transport but when [facility] arrived, pt did no want to go there.  Pt states "I just want to go to the park".  Items returned to pt, including  belongings bags, 1 black backpack and pt's wheelchair. Will provide cab voucher. Pt refused vital signs on discharge.

## 2022-11-03 NOTE — BH Specialist Note (Signed)
Clinician contacted RN Grover Canavan to complete TTS tele-assessment. Unable to complete assessment at this time, due Grover Canavan stating she will have to locate a room for Pt.  Manfred Arch, MSW, [name] Therapeutic Triage Specialist

## 2022-11-03 NOTE — ED Provider Notes (Signed)
[facility] EMERGENCY DEPARTMENT Provider Note   CSN: [id number] Arrival date & time: 11/03/22  0024     History  Chief Complaint  Patient presents with   Suicidal    [name] is a 56 y.o. male.  The history is provided by the patient and medical records.    77 male with history of DVT and PE on Eliquis, depression, prolonged QT, schizophrenia, polysubstance abuse, diabetes chronic wound of left foot, presenting to the ED with suicidal ideation.  Patient is currently homeless, states he has been increasingly depressed recently.  He has been having thoughts of rolling his wheelchair into traffic so he can get hit by car.  He denies any HI, no hallucinations.  He denies any drug use or alcohol use today.  Notes wound on left lateral foot.  This has been present for months without any significant change.  He has not changed his socks or dressing in about a week.  Home Medications Prior to Admission medications   Medication Sig Start Date End Date Taking? Authorizing Provider  albuterol (VENTOLIN HFA) 108 (90 Base) MCG/ACT inhaler Inhale 2 puffs into the lungs every 6 (six) hours as needed for wheezing or shortness of breath. 07/25/22   Danford, Suann Larry, MD  amLODipine (NORVASC) 10 MG tablet Take 1 tablet (10 mg total) by mouth daily. 10/10/22   Nita Sells, MD  apixaban (ELIQUIS) 5 MG TABS tablet Take 1 tablet (5 mg total) by mouth 2 (two) times daily. 10/09/22   Nita Sells, MD  ARIPiprazole (ABILIFY) 10 MG tablet Take 1 tablet (10 mg total) by mouth daily. 10/10/22   Nita Sells, MD  carvedilol (COREG) 3.125 MG tablet Take 1 tablet (3.125 mg total) by mouth 2 (two) times daily with a meal. 10/09/22   Nita Sells, MD  escitalopram (LEXAPRO) 5 MG tablet Take 1 tablet (5 mg total) by mouth daily. 10/10/22   Nita Sells, MD  gabapentin (NEURONTIN) 300 MG capsule Take 2 capsules (600 mg total) by mouth 3 (three) times  daily. 10/09/22   Nita Sells, MD  lisinopril (ZESTRIL) 10 MG tablet Take 1 tablet (10 mg total) by mouth daily. 10/10/22   Nita Sells, MD  oxyCODONE (OXY IR/ROXICODONE) 5 MG immediate release tablet Take 1 tablet (5 mg total) by mouth every 8 (eight) hours as needed for severe pain. 10/09/22   Nita Sells, MD  traZODone (DESYREL) 50 MG tablet Take 1 tablet (50 mg total) by mouth at bedtime as needed for sleep. 10/09/22   Nita Sells, MD      Allergies    Motrin [ibuprofen], Tylenol [acetaminophen], Lyrica cr [pregabalin er], and Peanut-containing drug products    Review of Systems   Review of Systems  Skin:  Positive for wound (chronic).  Psychiatric/Behavioral:  Positive for suicidal ideas.   All other systems reviewed and are negative.   Physical Exam Updated Vital Signs BP (!) 145/90   Pulse 85   Temp 97.6 F (36.4 C) (Oral)   Resp (!) 25   SpO2 97%   Physical Exam Vitals and nursing note reviewed.  Constitutional:      Appearance: He is well-developed.  HENT:     Head: Normocephalic and atraumatic.  Eyes:     Conjunctiva/sclera: Conjunctivae normal.     Pupils: Pupils are equal, round, and reactive to light.  Cardiovascular:     Rate and Rhythm: Normal rate and regular rhythm.     Heart sounds: Normal heart sounds.  Pulmonary:  Effort: Pulmonary effort is normal.     Breath sounds: Normal breath sounds.  Abdominal:     General: Bowel sounds are normal.     Palpations: Abdomen is soft.  Musculoskeletal:        General: Normal range of motion.     Cervical back: Normal range of motion.     Comments: Chronic appearing wound noted to lateral left foot just below lateral malleolus, dressing is dirty and skin appears macerated from moisture but no cellulitic changes of the foot, DP pulse intact  Skin:    General: Skin is warm and dry.  Neurological:     Mental Status: He is alert and oriented to person, place, and time.   Psychiatric:     Comments: SI, plan to roll wheelchair into traffic Denies HI/AVH     ED Results / Procedures / Treatments   Labs (all labs ordered are listed, but only abnormal results are displayed) Labs Reviewed  RESP PANEL BY RT-PCR (FLU A&B, COVID) ARPGX2 - Abnormal; Notable for the following components:      Result Value   Influenza A by PCR POSITIVE (*)    All other components within normal limits  CBC WITH DIFFERENTIAL/PLATELET - Abnormal; Notable for the following components:   RDW 17.8 (*)    All other components within normal limits  COMPREHENSIVE METABOLIC PANEL - Abnormal; Notable for the following components:   Glucose, Bld 109 (*)    Calcium 8.6 (*)    Albumin 3.1 (*)    All other components within normal limits  SALICYLATE LEVEL - Abnormal; Notable for the following components:   Salicylate Lvl [id number] (*)    All other components within normal limits  ACETAMINOPHEN LEVEL - Abnormal; Notable for the following components:   Acetaminophen (Tylenol), Serum <10 (*)    All other components within normal limits  ETHANOL  RAPID URINE DRUG SCREEN, HOSP PERFORMED    EKG None  Radiology No results found.  Procedures Procedures    Medications Ordered in ED Medications - No data to display  ED Course/ Medical Decision Making/ A&P                           Medical Decision Making Amount and/or Complexity of Data Reviewed Labs: ordered. ECG/medicine tests: ordered and independent interpretation performed.  Risk Prescription drug management.   56 year old male presenting to the ED with suicidal ideation.  He is currently homeless and has been increasingly depressed.  He has thoughts about rolling his wheelchair into traffic to get hit by car.  Also has chronic wound to left lateral foot that he would like checked.  This has been present for several months now, he has not had dressing or sock change in about a week.  Wound is chronic appearing in nature.   There is some macerated skin with breakdown from wet dressing, however no cellulitic changes of the foot.  He does not have any noted fever or other clinical signs of sepsis.  His labs are overall reassuring without leukocytosis.  No electrolyte derangement.  Negative Tylenol and salicylate levels.  His viral panel is positive for influenza A.  No reported URI symptoms recently but does seem to be coughing here.  Feel he can be medically cleared.  TTS to evaluate.  Holding orders in place, home meds ordered.  Final Clinical Impression(s) / ED Diagnoses Final diagnoses:  Suicidal ideation  Influenza A    Rx /  DC Orders ED Discharge Orders     None         Garlon Hatchet, PA-C 11/03/22 0554    Marily Memos, MD 11/04/22 [phone]

## 2022-11-23 ENCOUNTER — Emergency Department (HOSPITAL_COMMUNITY): Payer: Medicaid Other

## 2022-11-23 ENCOUNTER — Inpatient Hospital Stay (HOSPITAL_COMMUNITY)
Admission: EM | Admit: 2022-11-23 | Discharge: 2022-12-03 | DRG: 191 | Disposition: A | Discharge status: Home / Self Care | Payer: Medicaid Other | Attending: Internal Medicine | Admitting: Internal Medicine

## 2022-11-23 ENCOUNTER — Other Ambulatory Visit: Payer: Self-pay

## 2022-11-23 DIAGNOSIS — Z7901 Long term (current) use of anticoagulants: Secondary | ICD-10-CM

## 2022-11-23 DIAGNOSIS — J449 Chronic obstructive pulmonary disease, unspecified: Secondary | ICD-10-CM | POA: Diagnosis present

## 2022-11-23 DIAGNOSIS — Z886 Allergy status to analgesic agent status: Secondary | ICD-10-CM

## 2022-11-23 DIAGNOSIS — I5042 Chronic combined systolic (congestive) and diastolic (congestive) heart failure: Secondary | ICD-10-CM | POA: Diagnosis present

## 2022-11-23 DIAGNOSIS — Z86711 Personal history of pulmonary embolism: Secondary | ICD-10-CM

## 2022-11-23 DIAGNOSIS — L97322 Non-pressure chronic ulcer of left ankle with fat layer exposed: Secondary | ICD-10-CM | POA: Diagnosis present

## 2022-11-23 DIAGNOSIS — R45851 Suicidal ideations: Secondary | ICD-10-CM | POA: Diagnosis present

## 2022-11-23 DIAGNOSIS — Z888 Allergy status to other drugs, medicaments and biological substances status: Secondary | ICD-10-CM

## 2022-11-23 DIAGNOSIS — G8929 Other chronic pain: Secondary | ICD-10-CM | POA: Diagnosis present

## 2022-11-23 DIAGNOSIS — S81809A Unspecified open wound, unspecified lower leg, initial encounter: Secondary | ICD-10-CM | POA: Diagnosis present

## 2022-11-23 DIAGNOSIS — Z813 Family history of other psychoactive substance abuse and dependence: Secondary | ICD-10-CM

## 2022-11-23 DIAGNOSIS — Z91120 Patient's intentional underdosing of medication regimen due to financial hardship: Secondary | ICD-10-CM

## 2022-11-23 DIAGNOSIS — K219 Gastro-esophageal reflux disease without esophagitis: Secondary | ICD-10-CM | POA: Diagnosis present

## 2022-11-23 DIAGNOSIS — F17210 Nicotine dependence, cigarettes, uncomplicated: Secondary | ICD-10-CM | POA: Diagnosis present

## 2022-11-23 DIAGNOSIS — Z79899 Other long term (current) drug therapy: Secondary | ICD-10-CM

## 2022-11-23 DIAGNOSIS — E119 Type 2 diabetes mellitus without complications: Secondary | ICD-10-CM | POA: Diagnosis present

## 2022-11-23 DIAGNOSIS — F1410 Cocaine abuse, uncomplicated: Secondary | ICD-10-CM | POA: Diagnosis present

## 2022-11-23 DIAGNOSIS — Z89412 Acquired absence of left great toe: Secondary | ICD-10-CM

## 2022-11-23 DIAGNOSIS — Z1152 Encounter for screening for COVID-19: Secondary | ICD-10-CM

## 2022-11-23 DIAGNOSIS — Z5900 Homelessness unspecified: Secondary | ICD-10-CM

## 2022-11-23 DIAGNOSIS — J441 Chronic obstructive pulmonary disease with (acute) exacerbation: Principal | ICD-10-CM | POA: Diagnosis present

## 2022-11-23 DIAGNOSIS — I110 Hypertensive heart disease with heart failure: Secondary | ICD-10-CM | POA: Diagnosis present

## 2022-11-23 DIAGNOSIS — Z89611 Acquired absence of right leg above knee: Secondary | ICD-10-CM

## 2022-11-23 DIAGNOSIS — E785 Hyperlipidemia, unspecified: Secondary | ICD-10-CM | POA: Diagnosis present

## 2022-11-23 DIAGNOSIS — F1210 Cannabis abuse, uncomplicated: Secondary | ICD-10-CM | POA: Diagnosis present

## 2022-11-23 DIAGNOSIS — F250 Schizoaffective disorder, bipolar type: Secondary | ICD-10-CM | POA: Diagnosis present

## 2022-11-23 DIAGNOSIS — F17200 Nicotine dependence, unspecified, uncomplicated: Secondary | ICD-10-CM | POA: Diagnosis present

## 2022-11-23 DIAGNOSIS — E1142 Type 2 diabetes mellitus with diabetic polyneuropathy: Secondary | ICD-10-CM | POA: Diagnosis present

## 2022-11-23 DIAGNOSIS — T45516A Underdosing of anticoagulants, initial encounter: Secondary | ICD-10-CM | POA: Diagnosis present

## 2022-11-23 DIAGNOSIS — I2510 Atherosclerotic heart disease of native coronary artery without angina pectoris: Secondary | ICD-10-CM | POA: Diagnosis present

## 2022-11-23 DIAGNOSIS — L03116 Cellulitis of left lower limb: Secondary | ICD-10-CM

## 2022-11-23 DIAGNOSIS — Z5901 Sheltered homelessness: Secondary | ICD-10-CM

## 2022-11-23 DIAGNOSIS — F1010 Alcohol abuse, uncomplicated: Secondary | ICD-10-CM | POA: Diagnosis present

## 2022-11-23 DIAGNOSIS — R9431 Abnormal electrocardiogram [ECG] [EKG]: Secondary | ICD-10-CM | POA: Diagnosis present

## 2022-11-23 NOTE — ED Provider Notes (Signed)
[facility]-EMERGENCY DEPT Provider Note   CSN: [id number] Arrival date & time: 11/23/22  2155     History {Add pertinent medical, surgical, social history, OB history to HPI:1} Chief Complaint  Patient presents with   Suicidal    [name] is a 56 y.o. male.  HPI   Patient with medical history including asthma, bipolar, depression, schizophrenia, chronic congestive heart failure, homelessness, hypertension, type 2 diabetes, polysubstance dependency, above-the-knee amputation right leg presents with complaints of suicidal ideations as well as left leg swelling.  Patient states that he has been having left leg swelling for the last month, he states that just got worse, states its painful, he states the pain is all over, does not focalized, he denies any trauma to his leg, he states that his left foot has been wrapped since the last he was in the hospital, he states he has not taken off since then, he denies any fevers chills cough congestion shortness of breath, he notes that he is not been compliant with his Eliquis for the last 4 days as he has ran out of it.  He notes that he feels suicidal, states that he does not want to live any longer would like to kill himself by stabbing himself in the neck, he admits to drinking alcohol and taking cocaine prior to arrival.  I reviewed patient's chart was recently admitted 3 months ago for recurrent PE of the right lung, this is secondary due to noncompliance likely from poor financial resources as he is homeless.  Home Medications Prior to Admission medications   Medication Sig Start Date End Date Taking? Authorizing Provider  albuterol (VENTOLIN HFA) 108 (90 Base) MCG/ACT inhaler Inhale 2 puffs into the lungs every 6 (six) hours as needed for wheezing or shortness of breath. 07/25/22   Danford, Earl Lites, MD  amLODipine (NORVASC) 10 MG tablet Take 1 tablet (10 mg total) by mouth daily. 10/10/22   Rhetta Mura,  MD  apixaban (ELIQUIS) 5 MG TABS tablet Take 1 tablet (5 mg total) by mouth 2 (two) times daily. 10/09/22   Rhetta Mura, MD  ARIPiprazole (ABILIFY) 10 MG tablet Take 1 tablet (10 mg total) by mouth daily. 10/10/22   Rhetta Mura, MD  carvedilol (COREG) 3.125 MG tablet Take 1 tablet (3.125 mg total) by mouth 2 (two) times daily with a meal. 10/09/22   Rhetta Mura, MD  escitalopram (LEXAPRO) 5 MG tablet Take 1 tablet (5 mg total) by mouth daily. 10/10/22   Rhetta Mura, MD  gabapentin (NEURONTIN) 300 MG capsule Take 2 capsules (600 mg total) by mouth 3 (three) times daily. 10/09/22   Rhetta Mura, MD  lisinopril (ZESTRIL) 10 MG tablet Take 1 tablet (10 mg total) by mouth daily. 10/10/22   Rhetta Mura, MD  oxyCODONE (OXY IR/ROXICODONE) 5 MG immediate release tablet Take 1 tablet (5 mg total) by mouth every 8 (eight) hours as needed for severe pain. 10/09/22   Rhetta Mura, MD  traZODone (DESYREL) 50 MG tablet Take 1 tablet (50 mg total) by mouth at bedtime as needed for sleep. 10/09/22   Rhetta Mura, MD      Allergies    Motrin [ibuprofen], Tylenol [acetaminophen], Lyrica cr [pregabalin er], and Peanut-containing drug products    Review of Systems   Review of Systems  Physical Exam Updated Vital Signs BP (!) 144/98 (BP Location: Left Arm)   Pulse (!) 109   Temp 98.1 F (36.7 C) (Oral)   Resp (!) 22  Ht 5\' 9"  (1.753 m) Comment: Simultaneous filing. User may not have seen previous data.  Wt 86.2 kg Comment: Simultaneous filing. User may not have seen previous data.  SpO2 98%   BMI 28.06 kg/m  Physical Exam  ED Results / Procedures / Treatments   Labs (all labs ordered are listed, but only abnormal results are displayed) Labs Reviewed  RESP PANEL BY RT-PCR (RSV, FLU A&B, COVID)  [id number]  BASIC METABOLIC PANEL  CBC WITH DIFFERENTIAL/PLATELET  LACTIC ACID, PLASMA  LACTIC ACID, PLASMA  ETHANOL  RAPID URINE DRUG SCREEN,  HOSP PERFORMED    EKG None  Radiology No results found.  Procedures Procedures  {Document cardiac monitor, telemetry assessment procedure when appropriate:1}  Medications Ordered in ED Medications - No data to display  ED Course/ Medical Decision Making/ A&P                           Medical Decision Making Amount and/or Complexity of Data Reviewed Labs: ordered. Radiology: ordered.   ***  {Document critical care time when appropriate:1} {Document review of labs and clinical decision tools ie heart score, Chads2Vasc2 etc:1}  {Document your independent review of radiology images, and any outside records:1} {Document your discussion with family members, caretakers, and with consultants:1} {Document social determinants of health affecting pt's care:1} {Document your decision making why or why not admission, treatments were needed:1} Final Clinical Impression(s) / ED Diagnoses Final diagnoses:  None    Rx / DC Orders ED Discharge Orders     None

## 2022-11-23 NOTE — ED Triage Notes (Addendum)
Patient BIB [facility] for evaluation of SI and swelling to L leg.  Reports SI started 3 days ago.  Pt is homeless.  Pt endorses ETOH and drug use today.  Reports he has a hx of SI/SA.  Current plan would be to "cut my throat."

## 2022-11-24 ENCOUNTER — Emergency Department (HOSPITAL_COMMUNITY): Payer: Medicaid Other

## 2022-11-24 ENCOUNTER — Encounter (HOSPITAL_COMMUNITY): Payer: Self-pay | Admitting: Emergency Medicine

## 2022-11-24 DIAGNOSIS — J441 Chronic obstructive pulmonary disease with (acute) exacerbation: Secondary | ICD-10-CM

## 2022-11-24 LAB — BRAIN NATRIURETIC PEPTIDE: B Natriuretic Peptide: 747.3 pg/mL — ABNORMAL HIGH (ref 0.0–100.0)

## 2022-11-24 LAB — CBG MONITORING, ED
Glucose-Capillary: 177 mg/dL — ABNORMAL HIGH (ref 70–99)
Glucose-Capillary: 193 mg/dL — ABNORMAL HIGH (ref 70–99)

## 2022-11-24 LAB — BASIC METABOLIC PANEL
Anion gap: 10 (ref 5–15)
BUN: 13 mg/dL (ref 6–20)
CO2: 24 mmol/L (ref 22–32)
Calcium: 9 mg/dL (ref 8.9–10.3)
Chloride: 105 mmol/L (ref 98–111)
Creatinine, Ser: 0.96 mg/dL (ref 0.61–1.24)
GFR, Estimated: >60 mL/min (ref >60)
Glucose, Bld: 92 mg/dL (ref 70–99)
Potassium: 3.6 mmol/L (ref 3.5–5.1)
Sodium: 139 mmol/L (ref 135–145)

## 2022-11-24 LAB — CBC WITH DIFFERENTIAL/PLATELET
Abs Immature Granulocytes: 0.03 10*3/uL (ref 0.00–0.07)
Basophils Absolute: 0.1 10*3/uL (ref 0.0–0.1)
Basophils Relative: 1 %
Eosinophils Absolute: 0.1 10*3/uL (ref 0.0–0.5)
Eosinophils Relative: 1 %
HCT: 40.2 % (ref 39.0–52.0)
Hemoglobin: 13 g/dL (ref 13.0–17.0)
Immature Granulocytes: 0 %
Lymphocytes Relative: 29 %
Lymphs Abs: 2.7 10*3/uL (ref 0.7–4.0)
MCH: 30.4 pg (ref 26.0–34.0)
MCHC: 32.3 g/dL (ref 30.0–36.0)
MCV: 94.1 fL (ref 80.0–100.0)
Monocytes Absolute: 1 10*3/uL (ref 0.1–1.0)
Monocytes Relative: 11 %
Neutro Abs: 5.4 10*3/uL (ref 1.7–7.7)
Neutrophils Relative %: 58 %
Platelets: 305 10*3/uL (ref 150–400)
RBC: 4.27 MIL/uL (ref 4.22–5.81)
RDW: 16.5 % — ABNORMAL HIGH (ref 11.5–15.5)
WBC: 9.3 10*3/uL (ref 4.0–10.5)
nRBC: 0 % (ref 0.0–0.2)

## 2022-11-24 LAB — BLOOD GAS, VENOUS
Acid-Base Excess: 0 mmol/L (ref 0.0–2.0)
Bicarbonate: 25.4 mmol/L (ref 20.0–28.0)
O2 Saturation: 64.8 %
Patient temperature: 37
pCO2, Ven: 43 mmHg — ABNORMAL LOW (ref 44–60)
pH, Ven: 7.38 (ref 7.25–7.43)
pO2, Ven: 39 mmHg (ref 32–45)

## 2022-11-24 LAB — LACTIC ACID, PLASMA: Lactic Acid, Venous: 1.6 mmol/L (ref 0.5–1.9)

## 2022-11-24 LAB — RAPID URINE DRUG SCREEN, HOSP PERFORMED
Amphetamines: NOT DETECTED
Barbiturates: NOT DETECTED
Benzodiazepines: NOT DETECTED
Cocaine: POSITIVE — AB
Opiates: NOT DETECTED
Tetrahydrocannabinol: POSITIVE — AB

## 2022-11-24 LAB — RESP PANEL BY RT-PCR (RSV, FLU A&B, COVID)  RVPGX2
Influenza A by PCR: NEGATIVE
Influenza B by PCR: NEGATIVE
Resp Syncytial Virus by PCR: NEGATIVE
SARS Coronavirus 2 by RT PCR: NEGATIVE

## 2022-11-24 LAB — GLUCOSE, CAPILLARY: Glucose-Capillary: 141 mg/dL — ABNORMAL HIGH (ref 70–99)

## 2022-11-24 LAB — ETHANOL: Alcohol, Ethyl (B): <10 mg/dL (ref <10)

## 2022-11-24 MED ORDER — IPRATROPIUM BROMIDE 0.02 % IN SOLN
0.5000 mg | Freq: Once | RESPIRATORY_TRACT | Status: AC
  Administered 2022-11-24: 0.5 mg via RESPIRATORY_TRACT
  Filled 2022-11-24: qty 2.5

## 2022-11-24 MED ORDER — OXYCODONE HCL 5 MG PO TABS
5.0000 mg | ORAL_TABLET | Freq: Four times a day (QID) | ORAL | Status: DC | PRN
  Administered 2022-11-24 – 2022-11-27 (×11): 5 mg via ORAL
  Filled 2022-11-24 (×11): qty 1

## 2022-11-24 MED ORDER — APIXABAN 5 MG PO TABS: 5.0000 mg | ORAL_TABLET | Freq: Two times a day (BID) | ORAL | Status: DC

## 2022-11-24 MED ORDER — LISINOPRIL 10 MG PO TABS
10.0000 mg | ORAL_TABLET | Freq: Every day | ORAL | Status: DC
  Administered 2022-11-24 – 2022-11-27 (×4): 10 mg via ORAL
  Filled 2022-11-24 (×4): qty 1

## 2022-11-24 MED ORDER — DOXYCYCLINE HYCLATE 100 MG PO TABS
100.0000 mg | ORAL_TABLET | Freq: Once | ORAL | Status: AC
  Administered 2022-11-24: 100 mg via ORAL

## 2022-11-24 MED ORDER — THIAMINE MONONITRATE 100 MG PO TABS
100.0000 mg | ORAL_TABLET | Freq: Every day | ORAL | Status: DC
  Administered 2022-12-03: 100 mg via ORAL
  Filled 2022-11-24 (×7): qty 1

## 2022-11-24 MED ORDER — INSULIN ASPART 100 UNIT/ML IJ SOLN
0.0000 [IU] | Freq: Three times a day (TID) | INTRAMUSCULAR | Status: DC
  Administered 2022-11-26 – 2022-11-27 (×2): 5 [IU] via SUBCUTANEOUS
  Administered 2022-11-28 (×2): 2 [IU] via SUBCUTANEOUS
  Administered 2022-11-29 – 2022-11-30 (×2): 3 [IU] via SUBCUTANEOUS
  Administered 2022-11-30: 2 [IU] via SUBCUTANEOUS
  Administered 2022-12-01 – 2022-12-02 (×3): 3 [IU] via SUBCUTANEOUS
  Administered 2022-12-03: 2 [IU] via SUBCUTANEOUS
  Filled 2022-11-24: qty 0.15

## 2022-11-24 MED ORDER — BUDESONIDE 0.5 MG/2ML IN SUSP
1.0000 mg | Freq: Two times a day (BID) | RESPIRATORY_TRACT | Status: DC
  Administered 2022-11-24 – 2022-12-02 (×16): 1 mg via RESPIRATORY_TRACT
  Filled 2022-11-24 (×18): qty 4

## 2022-11-24 MED ORDER — ALBUTEROL SULFATE (2.5 MG/3ML) 0.083% IN NEBU: 2.5000 mg | INHALATION_SOLUTION | RESPIRATORY_TRACT | Status: DC | PRN

## 2022-11-24 MED ORDER — NITROGLYCERIN 2 % TD OINT
1.0000 [in_us] | TOPICAL_OINTMENT | Freq: Once | TRANSDERMAL | Status: AC
  Administered 2022-11-24: 0.5 [in_us] via TOPICAL
  Filled 2022-11-24: qty 1

## 2022-11-24 MED ORDER — AMLODIPINE BESYLATE 10 MG PO TABS
10.0000 mg | ORAL_TABLET | Freq: Every day | ORAL | Status: DC
  Administered 2022-11-25 – 2022-12-03 (×9): 10 mg via ORAL
  Filled 2022-11-24 (×9): qty 1

## 2022-11-24 MED ORDER — ARIPIPRAZOLE 10 MG PO TABS: 10.0000 mg | ORAL_TABLET | Freq: Every day | ORAL | Status: DC

## 2022-11-24 MED ORDER — DOXYCYCLINE HYCLATE 100 MG PO TABS
100.0000 mg | ORAL_TABLET | Freq: Once | ORAL | Status: DC
  Filled 2022-11-24: qty 1

## 2022-11-24 MED ORDER — HYDROMORPHONE HCL 1 MG/ML IJ SOLN
0.5000 mg | Freq: Once | INTRAMUSCULAR | Status: AC
  Administered 2022-11-24: 0.5 mg via INTRAVENOUS
  Filled 2022-11-24: qty 1

## 2022-11-24 MED ORDER — MAGNESIUM SULFATE 2 GM/50ML IV SOLN
2.0000 g | Freq: Once | INTRAVENOUS | Status: AC
  Administered 2022-11-24: 2 g via INTRAVENOUS
  Filled 2022-11-24: qty 50

## 2022-11-24 MED ORDER — IOHEXOL 350 MG/ML SOLN
75.0000 mL | Freq: Once | INTRAVENOUS | Status: AC | PRN
  Administered 2022-11-24: 75 mL via INTRAVENOUS

## 2022-11-24 MED ORDER — LORAZEPAM 1 MG PO TABS: 0.0000 mg | ORAL_TABLET | Freq: Two times a day (BID) | ORAL | Status: AC

## 2022-11-24 MED ORDER — GUAIFENESIN-DM 100-10 MG/5ML PO SYRP
10.0000 mL | ORAL_SOLUTION | ORAL | Status: DC | PRN
  Administered 2022-11-24 – 2022-11-26 (×3): 10 mL via ORAL
  Filled 2022-11-24 (×3): qty 10

## 2022-11-24 MED ORDER — LORAZEPAM 1 MG PO TABS
0.0000 mg | ORAL_TABLET | Freq: Four times a day (QID) | ORAL | Status: AC
  Administered 2022-11-25: 1 mg via ORAL
  Administered 2022-11-25: 2 mg via ORAL
  Filled 2022-11-24: qty 1
  Filled 2022-11-24: qty 2
  Filled 2022-11-24: qty 1

## 2022-11-24 MED ORDER — LORAZEPAM 1 MG PO TABS: 1.0000 mg | ORAL_TABLET | ORAL | Status: AC | PRN

## 2022-11-24 MED ORDER — POTASSIUM CHLORIDE CRYS ER 20 MEQ PO TBCR
40.0000 meq | EXTENDED_RELEASE_TABLET | Freq: Once | ORAL | Status: AC
  Administered 2022-11-24: 40 meq via ORAL
  Filled 2022-11-24: qty 2

## 2022-11-24 MED ORDER — SODIUM CHLORIDE 0.9 % IV BOLUS
1000.0000 mL | Freq: Once | INTRAVENOUS | Status: AC
  Administered 2022-11-24: 1000 mL via INTRAVENOUS

## 2022-11-24 MED ORDER — LORAZEPAM 2 MG/ML IJ SOLN: 1.0000 mg | INTRAMUSCULAR | Status: AC | PRN

## 2022-11-24 MED ORDER — AMOXICILLIN-POT CLAVULANATE 875-125 MG PO TABS
1.0000 | ORAL_TABLET | Freq: Two times a day (BID) | ORAL | Status: DC
  Administered 2022-11-24 – 2022-11-25 (×3): 1 via ORAL
  Filled 2022-11-24 (×3): qty 1

## 2022-11-24 MED ORDER — IPRATROPIUM-ALBUTEROL 0.5-2.5 (3) MG/3ML IN SOLN
3.0000 mL | Freq: Four times a day (QID) | RESPIRATORY_TRACT | Status: DC
  Administered 2022-11-24 – 2022-11-25 (×5): 3 mL via RESPIRATORY_TRACT
  Filled 2022-11-24 (×5): qty 3

## 2022-11-24 MED ORDER — FOLIC ACID 1 MG PO TABS
1.0000 mg | ORAL_TABLET | Freq: Every day | ORAL | Status: DC
  Filled 2022-11-24 (×6): qty 1

## 2022-11-24 MED ORDER — METHYLPREDNISOLONE SODIUM SUCC 125 MG IJ SOLR
125.0000 mg | Freq: Once | INTRAMUSCULAR | Status: AC
  Administered 2022-11-24: 125 mg via INTRAVENOUS
  Filled 2022-11-24: qty 2

## 2022-11-24 MED ORDER — ENOXAPARIN SODIUM 100 MG/ML IJ SOSY
85.0000 mg | PREFILLED_SYRINGE | Freq: Two times a day (BID) | INTRAMUSCULAR | Status: DC
  Filled 2022-11-24: qty 1
  Filled 2022-11-24 (×2): qty 0.85

## 2022-11-24 MED ORDER — CARVEDILOL 3.125 MG PO TABS: 3.1250 mg | ORAL_TABLET | Freq: Two times a day (BID) | ORAL | Status: DC

## 2022-11-24 MED ORDER — FUROSEMIDE 20 MG PO TABS
20.0000 mg | ORAL_TABLET | Freq: Every day | ORAL | Status: DC
  Administered 2022-11-24 – 2022-11-25 (×2): 20 mg via ORAL
  Filled 2022-11-24 (×2): qty 1

## 2022-11-24 MED ORDER — ESCITALOPRAM OXALATE 10 MG PO TABS: 5.0000 mg | ORAL_TABLET | Freq: Every day | ORAL | Status: DC

## 2022-11-24 MED ORDER — THIAMINE HCL 100 MG/ML IJ SOLN
100.0000 mg | Freq: Every day | INTRAMUSCULAR | Status: DC
  Filled 2022-11-24 (×2): qty 2

## 2022-11-24 MED ORDER — GABAPENTIN 300 MG PO CAPS
600.0000 mg | ORAL_CAPSULE | Freq: Three times a day (TID) | ORAL | Status: DC
  Administered 2022-11-24 – 2022-12-02 (×23): 600 mg via ORAL
  Filled 2022-11-24 (×23): qty 2

## 2022-11-24 MED ORDER — PROCHLORPERAZINE EDISYLATE 10 MG/2ML IJ SOLN: 10.0000 mg | Freq: Four times a day (QID) | INTRAMUSCULAR | Status: DC | PRN

## 2022-11-24 MED ORDER — ADULT MULTIVITAMIN W/MINERALS CH
1.0000 | ORAL_TABLET | Freq: Every day | ORAL | Status: DC
  Administered 2022-11-27: 1 via ORAL
  Filled 2022-11-24 (×6): qty 1

## 2022-11-24 MED ORDER — TRAZODONE HCL 50 MG PO TABS
50.0000 mg | ORAL_TABLET | Freq: Every evening | ORAL | Status: DC | PRN
  Filled 2022-11-24 (×2): qty 1

## 2022-11-24 NOTE — ED Notes (Signed)
Pt advises being unable to provide urine sample at this time, will monitor. 

## 2022-11-24 NOTE — ED Notes (Signed)
ED TO INPATIENT HANDOFF REPORT  Name/Age/Gender [name] 56 y.o. male  Code Status    Code Status Orders  (From admission, onward)           Start     Ordered   11/24/22 0744  Full code  Continuous       Question:  By:  Answer:  Consent: discussion documented in EHR   11/24/22 0745           Code Status History     Date Active Date Inactive Code Status Order ID Comments User Context   11/03/2022 0253 11/03/2022 1937 Full Code [id number]  Garlon Hatchet, PA-C ED   08/17/2022 0142 10/09/2022 2035 Full Code [id number]  Lurline Del, MD ED   07/12/2022 1026 07/25/2022 2113 Full Code [id number]  Bobette Mo, MD ED   04/04/2022 1240 04/11/2022 1710 Full Code [id number]  Bobette Mo, MD ED   04/03/2022 1411 04/04/2022 1240 Full Code [id number]  Darrick Grinder, PA-C ED   03/23/2022 2112 03/31/2022 1517 Full Code [id number]  Zigmund Daniel., MD Inpatient   02/26/2022 1812 03/07/2022 1908 Full Code [id number]  Teddy Spike, DO Inpatient   01/29/2022 1515 02/05/2022 1533 Full Code [id number]  Orland Mustard, MD ED   12/03/2021 0841 12/09/2021 1457 Full Code [id number]  Marinda Elk, MD ED   11/24/2021 2315 11/25/2021 2004 Full Code [id number]  Muthersbaugh, Boyd Kerbs ED   11/13/2021 1653 11/21/2021 1748 Full Code [id number]  Bobette Mo, MD Inpatient   11/02/2021 2000 11/04/2021 2331 Full Code [id number]  Derwood Kaplan, MD ED   07/03/2021 1143 07/05/2021 0057 Full Code [id number]  Ernie Avena, MD ED   03/02/2021 1936 03/12/2021 1811 Full Code [id number]  Charlsie Quest, MD ED   02/10/2021 2345 02/13/2021 1958 Full Code [id number]  Garlon Hatchet, PA-C ED   02/09/2021 1953 02/10/2021 1536 Full Code [id number]  Koleen Distance, MD ED   12/17/2020 1516 02/03/2021 1650 Full Code [id number]  Leda Gauze, NP ED   12/08/2020 1336 12/10/2020 1931 Full Code [id number]  Maury Dus, MD ED   11/13/2020 2028 11/16/2020 1520 Full Code [id number]  Margie Ege A, DO ED    10/26/2020 1039 10/31/2020 2157 Full Code [id number]  Jae Dire, MD ED   10/15/2020 0611 10/18/2020 1810 Full Code [id number]  Chotiner, Claudean Severance, MD Inpatient   10/02/2020 2247 10/07/2020 1809 Full Code [id number]  Briscoe Deutscher, MD ED   09/25/2020 1559 09/26/2020 2002 Full Code [id number]  Gertha Calkin, MD ED   09/09/2020 1251 09/11/2020 1546 Full Code [id number]  Noralee Stain, DO Inpatient   09/07/2020 1753 09/09/2020 1251 DNR [id number]  Teddy Spike, DO Inpatient   07/21/2020 1700 07/23/2020 2001 Full Code [id number]  Theotis Barrio, MD ED   07/07/2020 1529 07/07/2020 2109 Full Code [id number]  Terald Sleeper, MD ED   06/24/2019 0131 06/24/2019 1630 Full Code [id number]  Jackelyn Poling, NP Inpatient   06/24/2019 0130 06/24/2019 0131 Full Code [id number]  Denzil Magnuson, NP Inpatient   06/21/2019 0941 06/22/2019 2007 Full Code [id number]  Ellwood Dense, DO Inpatient   05/24/2019 1117 05/26/2019 1836 Full Code [id number]  Howard Pouch, MD ED   04/18/2019 0149 04/19/2019 1628 DNR [id number]  Levora Dredge, MD ED   04/04/2019 0555 04/06/2019 1413 Full Code [id number]  Lorenso Courier, MD ED   03/27/2019 2130 03/29/2019 1900 Full Code [id number]  Theotis Barrio, MD ED  03/04/2019 1600 03/09/2019 1222 Full Code [id number]  Eulah Pont, MD ED   02/13/2018 0439 02/14/2018 1333 Full Code [id number]  Glynn Octave, MD ED   12/26/2016 0547 12/29/2016 2021 Full Code [id number]  Audery Amel, MD Inpatient   11/27/2016 1541 11/28/2016 1319 Full Code [id number]  Charlestine Night, PA-C ED   10/29/2016 1415 10/31/2016 1150 Full Code [id number]  Benjiman Core, MD ED   09/06/2016 2230 09/07/2016 1350 Full Code [id number]  Doug Sou, MD ED   09/06/2016 0934 09/06/2016 1529 Full Code [id number]  Abelino Derrick, MD ED   09/02/2016 0045 09/04/2016 2127 Full Code [id number]  Earley Favor, NP ED   07/30/2016 1357 08/06/2016 2031 Full Code [id number]  Truman Hayward, FNP Inpatient   07/30/2016 0143 07/30/2016 1153 Full Code [id number]   [name], MD ED   05/30/2016 1622 06/05/2016 1454 Full Code [id number]  Thermon Leyland, NP Inpatient   05/29/2016 1635 05/30/2016 1622 Full Code [id number]  Melene Plan, DO ED   12/02/2011 0245 12/12/2011 0720 Full Code [id number]  Renne Crigler, PA ED       Home/SNF/Other Home  Chief Complaint COPD with acute exacerbation (HCC) [J44.1]  Level of Care/Admitting Diagnosis ED Disposition     ED Disposition  Admit   Condition  --   Comment  Hospital Area: [facility] [[id number]]  Level of Care: Telemetry [5]  Admit to tele based on following criteria: Monitor QTC interval  May place patient in observation at [facility] or [facility] if equivalent level of care is available:: Yes  Covid Evaluation: Asymptomatic - no recent exposure (last 10 days) testing not required  Diagnosis: COPD with acute exacerbation [facility]) [[id number]]  Admitting Physician: Bobette Mo [[id number]]  Attending Physician: Bobette Mo [[id number]]          Medical History Past Medical History:  Diagnosis Date   Asthma    Bipolar 1 disorder (HCC)    CHF (congestive heart failure) (HCC)    COPD (chronic obstructive pulmonary disease) (HCC)    Depression    Diabetes mellitus without complication (HCC)    GERD (gastroesophageal reflux disease)    Homelessness    Hypertension    QT prolongation    Schizoaffective disorder (HCC)     Allergies Allergies  Allergen Reactions   Motrin [Ibuprofen] Anaphylaxis   Tylenol [Acetaminophen] Hives, Nausea And Vomiting and Rash   Lyrica Cr [Pregabalin Er] Other (See Comments)    Dystonia Dysarthria    Peanut-Containing Drug Products Other (See Comments)    Pt denies allergy to peanut butter 03/02/2021    IV Location/Drains/Wounds Patient Lines/Drains/Airways Status     Active Line/Drains/Airways     Name Placement date Placement time Site Days   Peripheral IV 11/24/22 20 G 1.88" Anterior;Left;Proximal Forearm 11/24/22  0012   Forearm  less than 1   Wound / Incision (Open or Dehisced) 07/12/22 Ankle Left;Lateral 07/12/22  1830  Ankle  135   Wound / Incision (Open or Dehisced) 07/12/22 Venous stasis ulcer Ankle Left;Medial 07/12/22  1830  Ankle  135            Labs/Imaging Results for orders placed or performed during the hospital encounter of 11/23/22 (from the past 48 hour(s))  Basic metabolic panel     Status: None   Collection Time: 11/23/22 11:18 PM  Result Value Ref Range   Sodium 139 135 - 145 mmol/L   Potassium 3.6 3.5 - 5.1 mmol/L   Chloride  105 98 - 111 mmol/L   CO2 24 22 - 32 mmol/L   Glucose, Bld 92 70 - 99 mg/dL    Comment: Glucose reference range applies only to samples taken after fasting for at least 8 hours.   BUN 13 6 - 20 mg/dL   Creatinine, Ser 1.96 0.61 - 1.24 mg/dL   Calcium 9.0 8.9 - 22.2 mg/dL   GFR, Estimated >97 >98 mL/min    Comment: (NOTE) Calculated using the CKD-EPI Creatinine Equation (2021)    Anion gap 10 5 - 15    Comment: Performed at [facility], [address] [place], [name] [address]  CBC with Differential     Status: Abnormal   Collection Time: 11/23/22 11:18 PM  Result Value Ref Range   WBC 9.3 4.0 - 10.5 K/uL   RBC 4.27 4.22 - 5.81 MIL/uL   Hemoglobin 13.0 13.0 - 17.0 g/dL   HCT 41.7 40.8 - 14.4 %   MCV 94.1 80.0 - 100.0 fL   MCH 30.4 26.0 - 34.0 pg   MCHC 32.3 30.0 - 36.0 g/dL   RDW 81.8 (H) 56.3 - 14.9 %   Platelets 305 150 - 400 K/uL   nRBC 0.0 0.0 - 0.2 %   Neutrophils Relative % 58 %   Neutro Abs 5.4 1.7 - 7.7 K/uL   Lymphocytes Relative 29 %   Lymphs Abs 2.7 0.7 - 4.0 K/uL   Monocytes Relative 11 %   Monocytes Absolute 1.0 0.1 - 1.0 K/uL   Eosinophils Relative 1 %   Eosinophils Absolute 0.1 0.0 - 0.5 K/uL   Basophils Relative 1 %   Basophils Absolute 0.1 0.0 - 0.1 K/uL   Immature Granulocytes 0 %   Abs Immature Granulocytes 0.03 0.00 - 0.07 K/uL    Comment: Performed at [facility], [address] [place], [name] [address]  Resp panel by RT-PCR (RSV, Flu A&B, Covid) Anterior Nasal Swab     Status: None   Collection Time: 11/23/22 11:19 PM   Specimen: Anterior Nasal Swab  Result Value Ref Range   SARS Coronavirus 2 by RT PCR NEGATIVE NEGATIVE    Comment: (NOTE) SARS-CoV-2 target nucleic acids are NOT DETECTED.  The SARS-CoV-2 RNA is generally detectable in upper respiratory specimens during the acute phase of infection. The lowest concentration of SARS-CoV-2 viral copies this assay can detect is 138 copies/mL. A negative result does not preclude SARS-Cov-2 infection and should not be used as the sole basis for treatment or other patient management decisions. A negative result may occur with  improper specimen collection/handling, submission of specimen other than nasopharyngeal swab, presence of viral mutation(s) within the areas targeted by this assay, and inadequate number of viral copies(<138 copies/mL). A negative result must be combined with clinical observations, patient history, and epidemiological information. The expected result is Negative.  Fact Sheet for Patients:  BloggerCourse.com  Fact Sheet for Healthcare Providers:  SeriousBroker.it  This test is no t yet approved or cleared by the [place] FDA and  has been authorized for detection and/or diagnosis of SARS-CoV-2 by FDA under an Emergency Use Authorization (EUA). This EUA will remain  in effect (meaning this test can be used) for the duration of the COVID-19 declaration under Section 564(b)(1) of the Act, 21 U.S.C.section 360bbb-3(b)(1), unless the authorization is terminated  or revoked sooner.       Influenza A by PCR NEGATIVE NEGATIVE   Influenza B by PCR NEGATIVE NEGATIVE  Comment: (NOTE) The Xpert Xpress SARS-CoV-2/FLU/RSV plus assay is intended as an aid in the diagnosis of influenza from Nasopharyngeal swab specimens and should  not be used as a sole basis for treatment. Nasal washings and aspirates are unacceptable for Xpert Xpress SARS-CoV-2/FLU/RSV testing.  Fact Sheet for Patients: BloggerCourse.com  Fact Sheet for Healthcare Providers: SeriousBroker.it  This test is not yet approved or cleared by the [place] FDA and has been authorized for detection and/or diagnosis of SARS-CoV-2 by FDA under an Emergency Use Authorization (EUA). This EUA will remain in effect (meaning this test can be used) for the duration of the COVID-19 declaration under Section 564(b)(1) of the Act, 21 U.S.C. section 360bbb-3(b)(1), unless the authorization is terminated or revoked.     Resp Syncytial Virus by PCR NEGATIVE NEGATIVE    Comment: (NOTE) Fact Sheet for Patients: BloggerCourse.com  Fact Sheet for Healthcare Providers: SeriousBroker.it  This test is not yet approved or cleared by the [place] FDA and has been authorized for detection and/or diagnosis of SARS-CoV-2 by FDA under an Emergency Use Authorization (EUA). This EUA will remain in effect (meaning this test can be used) for the duration of the COVID-19 declaration under Section 564(b)(1) of the Act, 21 U.S.C. section 360bbb-3(b)(1), unless the authorization is terminated or revoked.  Performed at [facility], [address] [place], [name] [address]   Ethanol     Status: None   Collection Time: 11/23/22 11:59 PM  Result Value Ref Range   Alcohol, Ethyl (B) <10 <10 mg/dL    Comment: (NOTE) Lowest detectable limit for serum alcohol is 10 mg/dL.  For medical purposes only. Performed at [facility], [address] [place], [name] [address]   Brain natriuretic peptide     Status: Abnormal   Collection Time: 11/24/22 12:04 AM  Result Value Ref Range   B Natriuretic Peptide 747.3 (H) 0.0 - 100.0 pg/mL     Comment: Performed at [facility], [address], [place], [name] [address]  Lactic acid, plasma     Status: None   Collection Time: 11/24/22 12:11 AM  Result Value Ref Range   Lactic Acid, Venous 1.6 0.5 - 1.9 mmol/L    Comment: Performed at [facility], [address] [place], [name] [address]  Urine rapid drug screen (hosp performed)     Status: Abnormal   Collection Time: 11/24/22  2:39 AM  Result Value Ref Range   Opiates NONE DETECTED NONE DETECTED   Cocaine POSITIVE (A) NONE DETECTED   Benzodiazepines NONE DETECTED NONE DETECTED   Amphetamines NONE DETECTED NONE DETECTED   Tetrahydrocannabinol POSITIVE (A) NONE DETECTED   Barbiturates NONE DETECTED NONE DETECTED    Comment: (NOTE) DRUG SCREEN FOR MEDICAL PURPOSES ONLY.  IF CONFIRMATION IS NEEDED FOR ANY PURPOSE, NOTIFY LAB WITHIN 5 DAYS.  LOWEST DETECTABLE LIMITS FOR URINE DRUG SCREEN Drug Class                     Cutoff (ng/mL) Amphetamine and metabolites    1000 Barbiturate and metabolites    200 Benzodiazepine                 200 Opiates and metabolites        300 Cocaine and metabolites        300 THC  50 Performed at [facility], [address], [place], [name] [address]   Blood gas, venous (at [facility] and [facility])     Status: Abnormal   Collection Time: 11/24/22  4:16 AM  Result Value Ref Range   pH, Ven 7.38 7.25 - 7.43   pCO2, Ven 43 (L) 44 - 60 mmHg   pO2, Ven 39 32 - 45 mmHg   Bicarbonate 25.4 20.0 - 28.0 mmol/L   Acid-Base Excess 0.0 0.0 - 2.0 mmol/L   O2 Saturation 64.8 %   Patient temperature 37.0     Comment: Performed at [facility], [address] [place], [name] [address]  CBG monitoring, ED     Status: Abnormal   Collection Time: 11/24/22 12:28 PM  Result Value Ref Range   Glucose-Capillary 177 (H) 70 - 99 mg/dL    Comment: Glucose reference range applies only to samples taken after  fasting for at least 8 hours.   Comment 1 Notify RN    Comment 2 Document in Chart   CBG monitoring, ED     Status: Abnormal   Collection Time: 11/24/22  5:15 PM  Result Value Ref Range   Glucose-Capillary 193 (H) 70 - 99 mg/dL    Comment: Glucose reference range applies only to samples taken after fasting for at least 8 hours.   Comment 1 Notify RN    Comment 2 Document in Chart    CT Angio Chest PE W and/or Wo Contrast  Result Date: 11/24/2022 CLINICAL DATA:  Pulmonary embolism suspected, high probability. Left leg swelling, cough, wheezing, and shortness of breath. EXAM: CT ANGIOGRAPHY CHEST WITH CONTRAST TECHNIQUE: Multidetector CT imaging of the chest was performed using the standard protocol during bolus administration of intravenous contrast. Multiplanar CT image reconstructions and MIPs were obtained to evaluate the vascular anatomy. RADIATION DOSE REDUCTION: This exam was performed according to the departmental dose-optimization program which includes automated exposure control, adjustment of the mA and/or kV according to patient size and/or use of iterative reconstruction technique. CONTRAST:  73mL OMNIPAQUE IOHEXOL 350 MG/ML SOLN COMPARISON:  08/16/2022. FINDINGS: Cardiovascular: The heart is enlarged and there is no pericardial effusion. Scattered coronary artery calcifications are noted. There is atherosclerotic calcification of the aorta without evidence of aneurysm. The pulmonary trunk is distended suggesting underlying pulmonary artery hypertension. A small segmental pulmonary artery filling defect is identified in the right lower lobe and left lower lobe. No evidence of right heart strain. Mediastinum/Nodes: No mediastinal lymphadenopathy. There is an enlarged lymph node at the right hilum measuring 1.8 cm. No axillary lymphadenopathy. The thyroid gland, trachea, and esophagus are within normal limits. Lungs/Pleura: Advanced emphysematous changes are present in the lungs. Bronchial  wall thickening is noted bilaterally with patchy airspace disease in the lower lobes bilaterally and right middle lobe. There is mild residual consolidation in the posterior aspect of the right upper lobe. No effusion or pneumothorax. Upper Abdomen: There is reflux of contrast into the inferior vena cava and hepatic veins, suggesting right heart failure. No acute abnormality. Musculoskeletal: Degenerative changes in the thoracic spine. No acute osseous abnormality. Review of the MIP images confirms the above findings. IMPRESSION: 1. Small segmental pulmonary emboli in the lower lobes bilaterally, which may be residual embolus from prior pulmonary embolism in 08/16/2022 and decreased from prior exam. No evidence of right heart strain. 2. Bronchial wall thickening bilaterally with patchy airspace disease in the lower lobes bilaterally and right middle lobe. Mild residual airspace disease in the  right upper lobe. 3. Emphysema. 4. Cardiomegaly with coronary artery calcifications. 5. Distended pulmonary trunk suggesting underlying pulmonary artery hypertension. 6. Aortic atherosclerosis. Electronically Signed   By: Thornell Sartorius M.D.   On: 11/24/2022 03:43   DG Chest Port 1 View  Result Date: 11/23/2022 CLINICAL DATA:  Cough. EXAM: PORTABLE CHEST 1 VIEW COMPARISON:  08/20/2022, 08/16/2022. FINDINGS: Heart is enlarged and the mediastinal contour is within normal limits. Persistent opacity is noted in the mid right lung which may be associated with previous pulmonary infarction. The left lung is clear. No effusion or pneumothorax. No acute osseous abnormality IMPRESSION: 1. Persistent opacity in the mid right lung which may be associated with previously described pulmonary infarct. The possibility of superimposed infection can not be excluded. 2. Cardiomegaly. Electronically Signed   By: Thornell Sartorius M.D.   On: 11/23/2022 23:55   DG Foot Complete Left  Result Date: 11/23/2022 CLINICAL DATA:  Open wounds on  medial and lateral lateral malleoli. Concern for osteomyelitis. EXAM: LEFT FOOT - COMPLETE 3+ VIEW; LEFT ANKLE COMPLETE - 3+ VIEW COMPARISON:  None Available. FINDINGS: There is no evidence of fracture or dislocation. No periosteal elevation or bony erosion. Large calcaneal spur. 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 Degenerative changes are present in the midfoot. Soft tissue swelling is present about the ankle and over the dorsum of the foot. IMPRESSION: No radiographic evidence of osteomyelitis. Electronically Signed   By: Thornell Sartorius M.D.   On: 11/23/2022 23:52   DG Ankle Complete Left  Result Date: 11/23/2022 CLINICAL DATA:  Open wounds on medial and lateral lateral malleoli. Concern for osteomyelitis. EXAM: LEFT FOOT - COMPLETE 3+ VIEW; LEFT ANKLE COMPLETE - 3+ VIEW COMPARISON:  None Available. FINDINGS: There is no evidence of fracture or dislocation. No periosteal elevation or bony erosion. Large calcaneal spur. 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 Degenerative changes are present in the midfoot. Soft tissue swelling is present about the ankle and over the dorsum of the foot. IMPRESSION: No radiographic evidence of osteomyelitis. Electronically Signed   By: Thornell Sartorius M.D.   On: 11/23/2022 23:52    Pending Labs Unresulted Labs (From admission, onward)     Start     Ordered   11/26/22 0500  Basic metabolic panel  Daily,   R     Comments: As Scheduled for 5 days    11/24/22 1141   11/25/22 0500  CBC  Tomorrow morning,   R        11/24/22 0745   11/25/22 0500  Comprehensive metabolic panel  Tomorrow morning,   R        11/24/22 0745   11/24/22 0004  Blood culture (routine x 2)  BLOOD CULTURE X 2,   R      11/24/22 0003            Vitals/Pain Today's Vitals   11/24/22 1130 11/24/22 1500 11/24/22 1631 11/24/22 1908  BP: (!) 147/96 (!) 135/105    Pulse: (!) 112 (!) 108    Resp: 20 20    Temp:  97.7 F (36.5 C)    TempSrc:      SpO2: 97% 94% 96%   Weight:       Height:      PainSc:    7     Isolation Precautions No active isolations  Medications Medications  budesonide (PULMICORT) nebulizer solution 1 mg (1 mg Nebulization Given 11/24/22 0814)  oxyCODONE (Oxy IR/ROXICODONE) immediate release tablet  5 mg (5 mg Oral Given 11/24/22 1432)  prochlorperazine (COMPAZINE) injection 10 mg (has no administration in time range)  albuterol (PROVENTIL) (2.5 MG/3ML) 0.083% nebulizer solution 2.5 mg (has no administration in time range)  traZODone (DESYREL) tablet 50 mg (has no administration in time range)  lisinopril (ZESTRIL) tablet 10 mg (10 mg Oral Given 11/24/22 1239)  gabapentin (NEURONTIN) capsule 600 mg (600 mg Oral Not Given 11/24/22 1907)  amLODipine (NORVASC) tablet 10 mg (10 mg Oral Not Given 11/24/22 1634)  ipratropium-albuterol (DUONEB) 0.5-2.5 (3) MG/3ML nebulizer solution 3 mL (3 mLs Nebulization Given 11/24/22 1631)  amoxicillin-clavulanate (AUGMENTIN) 875-125 MG per tablet 1 tablet (1 tablet Oral Given 11/24/22 1048)  guaiFENesin-dextromethorphan (ROBITUSSIN DM) 100-10 MG/5ML syrup 10 mL (10 mLs Oral Given 11/24/22 1240)  enoxaparin (LOVENOX) injection 85 mg (85 mg Subcutaneous Patient Refused/Not Given 11/24/22 1247)  insulin aspart (novoLOG) injection 0-15 Units (0 Units Subcutaneous Patient Refused/Not Given 11/24/22 1733)  furosemide (LASIX) tablet 20 mg (20 mg Oral Given 11/24/22 1239)  LORazepam (ATIVAN) tablet 1-4 mg (has no administration in time range)    Or  LORazepam (ATIVAN) injection 1-4 mg (has no administration in time range)  thiamine (VITAMIN B1) tablet 100 mg (100 mg Oral Patient Refused/Not Given 11/24/22 1238)    Or  thiamine (VITAMIN B1) injection 100 mg ( Intravenous See Alternative 11/24/22 1238)  folic acid (FOLVITE) tablet 1 mg (1 mg Oral Patient Refused/Not Given 11/24/22 1247)  multivitamin with minerals tablet 1 tablet (1 tablet Oral Patient Refused/Not Given 11/24/22 1248)  LORazepam (ATIVAN) tablet 0-4  mg ( Oral Not Given 11/24/22 1734)    Followed by  LORazepam (ATIVAN) tablet 0-4 mg (has no administration in time range)  sodium chloride 0.9 % bolus 1,000 mL (0 mLs Intravenous Stopped 11/24/22 0457)  HYDROmorphone (DILAUDID) injection 0.5 mg (0.5 mg Intravenous Given 11/24/22 0243)  iohexol (OMNIPAQUE) 350 MG/ML injection 75 mL (75 mLs Intravenous Contrast Given 11/24/22 0302)  ipratropium (ATROVENT) nebulizer solution 0.5 mg (0.5 mg Nebulization Given 11/24/22 0414)  magnesium sulfate IVPB 2 g 50 mL (0 g Intravenous Stopped 11/24/22 0526)  nitroGLYCERIN (NITROGLYN) 2 % ointment 1 inch (0.5 inches Topical Given 11/24/22 0428)  methylPREDNISolone sodium succinate (SOLU-MEDROL) 125 mg/2 mL injection 125 mg (125 mg Intravenous Given 11/24/22 0520)  doxycycline (VIBRA-TABS) tablet 100 mg (100 mg Oral Given 11/24/22 0520)  potassium chloride SA (KLOR-CON M) CR tablet 40 mEq (40 mEq Oral Given 11/24/22 0813)    Mobility walks with device

## 2022-11-24 NOTE — Plan of Care (Signed)
  Problem: Education: Goal: Knowledge of disease or condition will improve Outcome: Progressing Goal: Knowledge of the prescribed therapeutic regimen will improve Outcome: Progressing Goal: Individualized Educational Video(s) Outcome: Progressing   Problem: Activity: Goal: Ability to tolerate increased activity will improve Outcome: Progressing Goal: Will verbalize the importance of balancing activity with adequate rest periods Outcome: Progressing   Problem: Respiratory: Goal: Ability to maintain a clear airway will improve Outcome: Progressing Goal: Levels of oxygenation will improve Outcome: Progressing Goal: Ability to maintain adequate ventilation will improve Outcome: Progressing   Problem: Education: Goal: Ability to describe self-care measures that may prevent or decrease complications (Diabetes Survival Skills Education) will improve Outcome: Progressing Goal: Individualized Educational Video(s) Outcome: Progressing   Problem: Coping: Goal: Ability to adjust to condition or change in health will improve Outcome: Progressing   Problem: Fluid Volume: Goal: Ability to maintain a balanced intake and output will improve Outcome: Progressing   Problem: Health Behavior/Discharge Planning: Goal: Ability to identify and utilize available resources and services will improve Outcome: Progressing Goal: Ability to manage health-related needs will improve Outcome: Progressing   Problem: Metabolic: Goal: Ability to maintain appropriate glucose levels will improve Outcome: Progressing   Problem: Nutritional: Goal: Maintenance of adequate nutrition will improve Outcome: Progressing Goal: Progress toward achieving an optimal weight will improve Outcome: Progressing   Problem: Skin Integrity: Goal: Risk for impaired skin integrity will decrease Outcome: Progressing   Problem: Tissue Perfusion: Goal: Adequacy of tissue perfusion will improve Outcome: Progressing   

## 2022-11-24 NOTE — ED Notes (Signed)
Pt provided [place] sandwich and diet coke.

## 2022-11-24 NOTE — H&P (Signed)
History and Physical    Patient: [name] NWG:[id number] DOB: Mar 24, 1966 DOA: 11/23/2022 DOS: the patient was seen and examined on 11/24/2022 PCP: [name], NP  Patient coming from: Homeless  Chief Complaint:  Chief Complaint  Patient presents with   Suicidal   HPI: [name] is a 56 y.o. male with medical history significant of asthma/COPD, bipolar 1 disorder, depression, schizoaffective disorder, homelessness, GERD, hypertension, QT prolongation, type 2 diabetes, alcohol abuse, cocaine and cannabis abuse, tobacco use, chronic combined systolic and diastolic heart failure, right AKA who came into the emergency department due to feeling suicidal since 3 days ago, left lower extremity edema and worsening wounds.  He has not taking his medications since earlier this month.  He has been drinking EtOH, smoking cannabis and using cocaine.  He continues to smoke cigarettes.  His plan to commit suicide was to stab himself in the throat.  He had not taken his wounds bandages since they were placed earlier this month in the emergency department.  He denied fever, chills, rhinorrhea, sore throat, wheezing or hemoptysis.  No chest pain, palpitations, diaphoresis, PND, orthopnea or pitting edema of the lower extremities.  No abdominal pain, nausea, emesis, diarrhea, constipation, melena or hematochezia.  No flank pain, dysuria, frequency or hematuria.  No polyuria, polydipsia, polyphagia or blurred vision.   ED course: Initial vital signs were temperature 98.1 F, pulse 79, respirations 22, BP 144/98 mmHg O2 sat 98% on room air.  The patient received doxycycline 100 mg p.o. x 1, hydromorphone 0.5 mg IVP x 1, a DuoNeb, magnesium sulfate 2 g IVPB x 1, methylprednisolone 125 mg IVPB, Nitropaste 15 mg patch, KCl 40 mEq p.o. x 1 and 1000 mL of normal saline bolus.  Lab work: His CBC showed a white count of 9.3 with 58% neutrophils, hemoglobin 13.0 g/dL and platelets 578.  Coronavirus and  influenza PCR was negative.  Lactic acid and alcohol level were normal.  Venous blood gas showed a pCO2 of 43 mmHg, but was otherwise unremarkable.  BMP was normal.  BNP 747.3 pg/mL.  Coronavirus and influenza PCR negative.  UDS was positive for cocaine and THC.  Imaging: Left foot and ankle x-ray with no evidence of osteomyelitis.  Portable 1 view chest radiograph with persistent opacity in the mid right lung which may be associated with previously described pulmonary infarct.  Possibility of superimposed infection cannot be excluded.  There is cardiomegaly.  CTA chest shows small segmental pulmonary emboli in the lower lobes bilaterally, which may be residual embolus from prior pulmonary embolism in September.  No evidence of right heart strain.  There is bronchial wall thickening bilaterally with patchy airspace disease in the lower lobe bilaterally and right middle lobe.  Mild residual airspace disease in the right upper lobe.  Emphysema.  Cardiomegaly with coronary artery calcifications.  Distended pulmonary trunk suggesting underlying pulmonary artery hypertension.  Aortic atherosclerosis.   Review of Systems: As mentioned in the history of present illness. All other systems reviewed and are negative. Past Medical History:  Diagnosis Date   Asthma    Bipolar 1 disorder (HCC)    CHF (congestive heart failure) (HCC)    COPD (chronic obstructive pulmonary disease) (HCC)    Depression    Diabetes mellitus without complication (HCC)    GERD (gastroesophageal reflux disease)    Homelessness    Hypertension    QT prolongation    Schizoaffective disorder (HCC)    Past Surgical History:  Procedure Laterality Date  AMPUTATION TOE Bilateral 01/02/2021   Procedure: AMPUTATION TOE;  Surgeon: Park Liter, DPM;  Location: [facility] OR;  Service: Podiatry;  Laterality: Bilateral;  Ok to leave patient on hospital bed   IR ANGIO INTRA EXTRACRAN SEL COM CAROTID INNOMINATE BILAT MOD SED  12/17/2020   IR ANGIO  VERTEBRAL SEL VERTEBRAL BILAT MOD SED  12/17/2020   RADIOLOGY WITH ANESTHESIA N/A 12/17/2020   Procedure: IR WITH ANESTHESIA;  Surgeon: Radiologist, Medication, MD;  Location: [facility] OR;  Service: Radiology;  Laterality: N/A;   Social History:  reports that he has been smoking cigarettes. He has never used smokeless tobacco. He reports current alcohol use. He reports current drug use. Drug: Cocaine.  Allergies  Allergen Reactions   Motrin [Ibuprofen] Anaphylaxis   Tylenol [Acetaminophen] Hives, Nausea And Vomiting and Rash   Lyrica Cr [Pregabalin Er] Other (See Comments)    Dystonia Dysarthria    Peanut-Containing Drug Products Other (See Comments)    Pt denies allergy to peanut butter 03/02/2021    Family History  Problem Relation Age of Onset   Drug abuse Mother    Suicidality Mother     Prior to Admission medications   Medication Sig Start Date End Date Taking? Authorizing Provider  albuterol (VENTOLIN HFA) 108 (90 Base) MCG/ACT inhaler Inhale 2 puffs into the lungs every 6 (six) hours as needed for wheezing or shortness of breath. 07/25/22   Danford, Earl Lites, MD  amLODipine (NORVASC) 10 MG tablet Take 1 tablet (10 mg total) by mouth daily. 10/10/22   Rhetta Mura, MD  apixaban (ELIQUIS) 5 MG TABS tablet Take 1 tablet (5 mg total) by mouth 2 (two) times daily. 10/09/22   Rhetta Mura, MD  ARIPiprazole (ABILIFY) 10 MG tablet Take 1 tablet (10 mg total) by mouth daily. 10/10/22   Rhetta Mura, MD  carvedilol (COREG) 3.125 MG tablet Take 1 tablet (3.125 mg total) by mouth 2 (two) times daily with a meal. 10/09/22   Rhetta Mura, MD  escitalopram (LEXAPRO) 5 MG tablet Take 1 tablet (5 mg total) by mouth daily. 10/10/22   Rhetta Mura, MD  gabapentin (NEURONTIN) 300 MG capsule Take 2 capsules (600 mg total) by mouth 3 (three) times daily. 10/09/22   Rhetta Mura, MD  lisinopril (ZESTRIL) 10 MG tablet Take 1 tablet (10 mg total) by mouth daily.  10/10/22   Rhetta Mura, MD  oxyCODONE (OXY IR/ROXICODONE) 5 MG immediate release tablet Take 1 tablet (5 mg total) by mouth every 8 (eight) hours as needed for severe pain. 10/09/22   Rhetta Mura, MD  traZODone (DESYREL) 50 MG tablet Take 1 tablet (50 mg total) by mouth at bedtime as needed for sleep. 10/09/22   Rhetta Mura, MD    Physical Exam: Vitals:   11/24/22 0545 11/24/22 0551 11/24/22 0552 11/24/22 0700  BP:    (!) 133/102  Pulse: (!) 112 (!) 113 (!) 120 (!) 112  Resp:    (!) 24  Temp:      TempSrc:      SpO2: 100% 99% 100% 100%  Weight:      Height:       Physical Exam Vitals and nursing note reviewed.  Musculoskeletal:     Cervical back: Neck supple.  Neurological:     Mental Status: He is alert.     Data Reviewed:  Results are pending, will review when available.  IMPRESSIONS    1. Left ventricular ejection fraction, by estimation, is <20%. The left  ventricle has severely decreased  function. The left ventricle demonstrates  global hypokinesis. Left ventricular diastolic function could not be  evaluated.   2. Right ventricular systolic function is moderately reduced. The right  ventricular size is mildly enlarged. Tricuspid regurgitation signal is  inadequate for assessing PA pressure.   3. Right atrial size was mild to moderately dilated.   4. There is left bowing of the interatrial septum, suggestive of elevated  right atrial pressure. No atrial level shunt detected by color flow  Doppler.   5. The mitral valve is degenerative. Trivial mitral valve regurgitation.  No evidence of mitral stenosis.   6. The aortic valve is tricuspid. Aortic valve regurgitation is not  visualized. Aortic valve sclerosis is present, with no evidence of aortic  valve stenosis.   7. The inferior vena cava is normal in size with greater than 50%  respiratory variability, suggesting right atrial pressure of 3 mmHg.   Assessment and Plan: Principal  Problem:   COPD with acute exacerbation (HCC) Observation/PCU. Continue supplemental oxygen. Methylprednisolone 125 mg IVP x1. Followed by nebulized budesonide twice daily. Scheduled and as needed bronchodilators. Follow-up CBC and chemistry in the morning.  Smoking cessation advised.  Active Problems:   Chronic combined systolic and diastolic heart failure (HCC) Resume lisinopril 10 mg p.o. daily. Begin furosemide 20 mg p.o. daily. Monitor renal function electrolytes. Keep electrolytes optimized. Hold beta-blocker due to recent cocaine use.    Schizoaffective disorder, bipolar type [facility]) Presented with suicidal ideations. Continue suicide precautions. Resume Abilify 10 mg p.o. daily. Resume Lexapro 5 mg p.o. daily. Consult psychiatry.    Open wounds involving multiple regions of lower extremity Consult wound care. Consult podiatry. Begin Augmentin twice daily. Analgesics as needed.    Tobacco use disorder Declined nicotine replacement therapy for now. Smoking cessation advised.    Homeless Consult TOC.    Cocaine abuse (HCC) Consult TOC. Resume low-dose beta-blocker tomorrow or next day.    Hyperlipidemia Not on statin, history of EtOH/noncompliance Will defer starting statin at this time.    Coronary artery disease involving native  coronary artery of native heart without angina pectoris On anticoagulation. Resume low-dose beta-blocker in a day or 2..    Type 2 diabetes mellitus with diabetic polyneuropathy,  without long-term current use of insulin (HCC) Carbohydrate modified diet. CBG monitoring before meals and bedtime.    Prolonged QT interval Avoid QT prolonging meds as possible. Received magnesium sulfate 2 g IVPB this morning. Keep electrolytes optimized. Resume beta-blocker tomorrow or next day.    Alcohol abuse No signs of withdrawal. Folate, MVI and thiamine. Begin CIWA protocol if withdrawal symptoms.    Acid reflux Begin famotidine 20  mg p.o. twice daily.     Advance Care Planning:   Code Status: Full Code   Consults: Sharl Ma, DPM  Family Communication:   Severity of Illness: The appropriate patient status for this patient is OBSERVATION. Observation status is judged to be reasonable and necessary in order to provide the required intensity of service to ensure the patient's safety. The patient's presenting symptoms, physical exam findings, and initial radiographic and laboratory data in the context of their medical condition is felt to place them at decreased risk for further clinical deterioration. Furthermore, it is anticipated that the patient will be medically stable for discharge from the hospital within 2 midnights of admission.   Author: Bobette Mo, MD 11/24/2022 7:45 AM  For on call review [email or web address].   This document was prepared using Conservation officer, historic buildings and  may contain some unintended transcription errors.

## 2022-11-24 NOTE — ED Notes (Signed)
Verbal order for  1/2 inch nitro paste not 1 inch

## 2022-11-25 ENCOUNTER — Observation Stay (HOSPITAL_BASED_OUTPATIENT_CLINIC_OR_DEPARTMENT_OTHER): Payer: Medicaid Other

## 2022-11-25 ENCOUNTER — Observation Stay (HOSPITAL_COMMUNITY): Payer: Medicaid Other

## 2022-11-25 DIAGNOSIS — I2510 Atherosclerotic heart disease of native coronary artery without angina pectoris: Secondary | ICD-10-CM | POA: Diagnosis present

## 2022-11-25 DIAGNOSIS — Z89611 Acquired absence of right leg above knee: Secondary | ICD-10-CM | POA: Diagnosis not present

## 2022-11-25 DIAGNOSIS — J441 Chronic obstructive pulmonary disease with (acute) exacerbation: Secondary | ICD-10-CM | POA: Diagnosis present

## 2022-11-25 DIAGNOSIS — Z5901 Sheltered homelessness: Secondary | ICD-10-CM | POA: Diagnosis not present

## 2022-11-25 DIAGNOSIS — Z7901 Long term (current) use of anticoagulants: Secondary | ICD-10-CM | POA: Diagnosis not present

## 2022-11-25 DIAGNOSIS — Z86711 Personal history of pulmonary embolism: Secondary | ICD-10-CM | POA: Diagnosis not present

## 2022-11-25 DIAGNOSIS — S81809A Unspecified open wound, unspecified lower leg, initial encounter: Secondary | ICD-10-CM | POA: Diagnosis not present

## 2022-11-25 DIAGNOSIS — F250 Schizoaffective disorder, bipolar type: Secondary | ICD-10-CM | POA: Diagnosis present

## 2022-11-25 DIAGNOSIS — Z813 Family history of other psychoactive substance abuse and dependence: Secondary | ICD-10-CM | POA: Diagnosis not present

## 2022-11-25 DIAGNOSIS — G8929 Other chronic pain: Secondary | ICD-10-CM | POA: Diagnosis present

## 2022-11-25 DIAGNOSIS — L0390 Cellulitis, unspecified: Secondary | ICD-10-CM | POA: Diagnosis not present

## 2022-11-25 DIAGNOSIS — J449 Chronic obstructive pulmonary disease, unspecified: Secondary | ICD-10-CM | POA: Diagnosis present

## 2022-11-25 DIAGNOSIS — L03116 Cellulitis of left lower limb: Secondary | ICD-10-CM | POA: Diagnosis present

## 2022-11-25 DIAGNOSIS — Z1152 Encounter for screening for COVID-19: Secondary | ICD-10-CM | POA: Diagnosis not present

## 2022-11-25 DIAGNOSIS — R9431 Abnormal electrocardiogram [ECG] [EKG]: Secondary | ICD-10-CM | POA: Diagnosis present

## 2022-11-25 DIAGNOSIS — E1142 Type 2 diabetes mellitus with diabetic polyneuropathy: Secondary | ICD-10-CM | POA: Diagnosis present

## 2022-11-25 DIAGNOSIS — E785 Hyperlipidemia, unspecified: Secondary | ICD-10-CM | POA: Diagnosis present

## 2022-11-25 DIAGNOSIS — F17210 Nicotine dependence, cigarettes, uncomplicated: Secondary | ICD-10-CM | POA: Diagnosis present

## 2022-11-25 DIAGNOSIS — K219 Gastro-esophageal reflux disease without esophagitis: Secondary | ICD-10-CM | POA: Diagnosis present

## 2022-11-25 DIAGNOSIS — L97322 Non-pressure chronic ulcer of left ankle with fat layer exposed: Secondary | ICD-10-CM | POA: Diagnosis present

## 2022-11-25 DIAGNOSIS — F32A Depression, unspecified: Secondary | ICD-10-CM | POA: Diagnosis not present

## 2022-11-25 DIAGNOSIS — F1210 Cannabis abuse, uncomplicated: Secondary | ICD-10-CM | POA: Diagnosis present

## 2022-11-25 DIAGNOSIS — F1410 Cocaine abuse, uncomplicated: Secondary | ICD-10-CM | POA: Diagnosis present

## 2022-11-25 DIAGNOSIS — R45851 Suicidal ideations: Secondary | ICD-10-CM | POA: Diagnosis present

## 2022-11-25 DIAGNOSIS — Z79899 Other long term (current) drug therapy: Secondary | ICD-10-CM | POA: Diagnosis not present

## 2022-11-25 DIAGNOSIS — F1010 Alcohol abuse, uncomplicated: Secondary | ICD-10-CM | POA: Diagnosis present

## 2022-11-25 DIAGNOSIS — I5042 Chronic combined systolic (congestive) and diastolic (congestive) heart failure: Secondary | ICD-10-CM | POA: Diagnosis present

## 2022-11-25 DIAGNOSIS — I110 Hypertensive heart disease with heart failure: Secondary | ICD-10-CM | POA: Diagnosis present

## 2022-11-25 LAB — COMPREHENSIVE METABOLIC PANEL
ALT: 10 U/L (ref 0–44)
AST: 13 U/L — ABNORMAL LOW (ref 15–41)
Albumin: 2.9 g/dL — ABNORMAL LOW (ref 3.5–5.0)
Alkaline Phosphatase: 65 U/L (ref 38–126)
Anion gap: 8 (ref 5–15)
BUN: 17 mg/dL (ref 6–20)
CO2: 23 mmol/L (ref 22–32)
Calcium: 8.4 mg/dL — ABNORMAL LOW (ref 8.9–10.3)
Chloride: 104 mmol/L (ref 98–111)
Creatinine, Ser: 0.88 mg/dL (ref 0.61–1.24)
GFR, Estimated: >60 mL/min (ref >60)
Glucose, Bld: 125 mg/dL — ABNORMAL HIGH (ref 70–99)
Potassium: 4.3 mmol/L (ref 3.5–5.1)
Sodium: 135 mmol/L (ref 135–145)
Total Bilirubin: 0.6 mg/dL (ref 0.3–1.2)
Total Protein: 7.1 g/dL (ref 6.5–8.1)

## 2022-11-25 LAB — CBC
HCT: 34.4 % — ABNORMAL LOW (ref 39.0–52.0)
Hemoglobin: 11 g/dL — ABNORMAL LOW (ref 13.0–17.0)
MCH: 30.3 pg (ref 26.0–34.0)
MCHC: 32 g/dL (ref 30.0–36.0)
MCV: 94.8 fL (ref 80.0–100.0)
Platelets: 270 10*3/uL (ref 150–400)
RBC: 3.63 MIL/uL — ABNORMAL LOW (ref 4.22–5.81)
RDW: 16.4 % — ABNORMAL HIGH (ref 11.5–15.5)
WBC: 10.7 10*3/uL — ABNORMAL HIGH (ref 4.0–10.5)
nRBC: 0 % (ref 0.0–0.2)

## 2022-11-25 LAB — GLUCOSE, CAPILLARY
Glucose-Capillary: 120 mg/dL — ABNORMAL HIGH (ref 70–99)
Glucose-Capillary: 91 mg/dL (ref 70–99)
Glucose-Capillary: 112 mg/dL — ABNORMAL HIGH (ref 70–99)
Glucose-Capillary: 110 mg/dL — ABNORMAL HIGH (ref 70–99)

## 2022-11-25 MED ORDER — IPRATROPIUM-ALBUTEROL 0.5-2.5 (3) MG/3ML IN SOLN
3.0000 mL | Freq: Two times a day (BID) | RESPIRATORY_TRACT | Status: DC
  Administered 2022-11-25 – 2022-12-03 (×14): 3 mL via RESPIRATORY_TRACT
  Filled 2022-11-25 (×16): qty 3

## 2022-11-25 MED ORDER — ARIPIPRAZOLE 5 MG PO TABS
10.0000 mg | ORAL_TABLET | Freq: Every day | ORAL | Status: DC
  Administered 2022-11-25 – 2022-12-03 (×9): 10 mg via ORAL
  Filled 2022-11-25 (×9): qty 2

## 2022-11-25 MED ORDER — ESCITALOPRAM OXALATE 10 MG PO TABS
5.0000 mg | ORAL_TABLET | Freq: Every day | ORAL | Status: DC
  Administered 2022-11-25 – 2022-12-03 (×9): 5 mg via ORAL
  Filled 2022-11-25 (×9): qty 1

## 2022-11-25 MED ORDER — ENOXAPARIN SODIUM 80 MG/0.8ML IJ SOSY: 80.0000 mg | PREFILLED_SYRINGE | Freq: Two times a day (BID) | INTRAMUSCULAR | Status: DC

## 2022-11-25 MED ORDER — HYDROMORPHONE HCL 1 MG/ML IJ SOLN
1.0000 mg | Freq: Once | INTRAMUSCULAR | Status: AC
  Administered 2022-11-25: 1 mg via INTRAVENOUS
  Filled 2022-11-25: qty 1

## 2022-11-25 MED ORDER — HYDROCERIN EX CREA
TOPICAL_CREAM | Freq: Every day | CUTANEOUS | Status: DC
  Filled 2022-11-25: qty 113

## 2022-11-25 MED ORDER — APIXABAN 5 MG PO TABS
5.0000 mg | ORAL_TABLET | Freq: Two times a day (BID) | ORAL | Status: DC
  Administered 2022-11-25 – 2022-12-03 (×16): 5 mg via ORAL
  Filled 2022-11-25 (×16): qty 1

## 2022-11-25 NOTE — TOC Initial Note (Signed)
Transition of Care [facility]) - Initial/Assessment Note    Patient Details  Name: [name] MRN: [id number] Date of Birth: 09-04-1966  Transition of Care [facility]) CM/SW Contact:    Vassie Moselle, [name] Phone Number: 11/25/2022, 1:52 PM  Clinical Narrative:                 Met with pt to discuss SDOH concerns. Information obtained from chart review and speaking with pt. PT currently homeless and has been staying at [facility]. Pt shares that people will come and hand out food but, otherwise he does not eat. He does not utilize [facility] or [employer] due to not having anyone to "push him" in his wheelchair. Pt shares he is not connected with outpatient MH or SA tx. Pt has had barriers in the past in obtaining treatment at SNF and rehab facilities due to no facility able to accept. Pt currently utilizes Medicaid transportation for appointments but, does not always use their service due to having to call in advance to schedule rides. Pt reports he has received ACTT services in the past in [facility] and is agreeable to ACTT services again.  Referral for ACTT has been sent to [facility] and to [facility].   Expected Discharge Plan: Homeless Shelter Barriers to Discharge: Continued Medical Work up   Patient Goals and CMS Choice Patient states their goals for this hospitalization and ongoing recovery are:: To get housing CMS Medicare.gov Compare Post Acute Care list provided to:: Patient Choice offered to / list presented to : Patient [place] ownership interest in [facility].provided to::  (NA)    Expected Discharge Plan and Services In-house Referral: NA Discharge Planning Services: NA Post Acute Care Choice: NA Living arrangements for the past 2 months: Homeless                 DME Arranged: N/A DME Agency: NA                  Prior Living Arrangements/Services Living arrangements for the past 2 months: Homeless Lives with::  Self Patient language and need for interpreter reviewed:: Yes Do you feel safe going back to the place where you live?: Yes      Need for Family Participation in Patient Care: No (Comment) Care giver support system in place?: No (comment) Current home services: DME Administrator, Civil Service) Criminal Activity/Legal Involvement Pertinent to Current Situation/Hospitalization: No - Comment as needed  Activities of Daily Living Home Assistive Devices/Equipment: Wheelchair ADL Screening (condition at time of admission) Patient's cognitive ability adequate to safely complete daily activities?: Yes Is the patient deaf or have difficulty hearing?: No Does the patient have difficulty seeing, even when wearing glasses/contacts?: No Does the patient have difficulty concentrating, remembering, or making decisions?: No Patient able to express need for assistance with ADLs?: Yes Does the patient have difficulty dressing or bathing?: No Independently performs ADLs?: Yes (appropriate for developmental age) Does the patient have difficulty walking or climbing stairs?: Yes Weakness of Legs: Both Weakness of Arms/Hands: None  Permission Sought/Granted Permission sought to share information with : Other (comment) (ACT Teams)                Emotional Assessment Appearance:: Appears stated age Attitude/Demeanor/Rapport: Engaged Affect (typically observed): Irritable Orientation: : Oriented to Self, Oriented to Place, Oriented to  Time, Oriented to Situation Alcohol / Substance Use: Not Applicable Psych Involvement: No (comment)  Admission diagnosis:  Cellulitis of left lower extremity [L03.116] COPD  with acute exacerbation ([place]) [J44.1] Chronic obstructive pulmonary disease with acute exacerbation ([place]) [J44.1] Patient Active Problem List   Diagnosis Date Noted   Pulmonary emboli ([place]) 08/17/2022   Venous ulcer ([place]) 07/23/2022   Sinus bradycardia 07/23/2022   Anemia of chronic disease 07/23/2022   S/P  AKA (above knee amputation), right ([place]) 07/23/2022   Acute on chronic systolic congestive heart failure ([place]) 07/12/2022   History of DVT (deep vein thrombosis) 07/12/2022   CAP (community acquired pneumonia) 07/12/2022   Chronic venous hypertension (idiopathic) with ulcer and inflammation of bilateral lower extremity ([place])    Sepsis due to cellulitis ([place]) 04/04/2022   Normocytic anemia 04/04/2022   Acute deep vein thrombosis (DVT) of brachial vein ([place]) 03/31/2022   Swelling of lower extremity 03/23/2022   SIRS (systemic inflammatory response syndrome) ([place]) 03/23/2022   Leg swelling 03/23/2022   Acute on chronic systolic heart failure ([place]) 02/26/2022   Seizure ([place]) 02/26/2022   Venous stasis ulcer of right ankle limited to breakdown of skin with varicose veins (HCC)    Hypertensive urgency 01/29/2022   Prolonged QT interval 01/29/2022   Alcohol abuse 01/29/2022   chronic ulcers 01/29/2022   Wound infection 12/04/2021   Open wounds involving multiple regions of lower extremity 12/03/2021   Mixed diabetic hyperlipidemia associated with type 2 diabetes mellitus ([place]) 12/03/2021   Cellulitis of right lower extremity 11/13/2021   Type 2 diabetes mellitus with diabetic polyneuropathy, without long-term current use of insulin ([place]) 11/13/2021   Antisocial personality disorder ([place]) 11/04/2021   Malingering 11/04/2021   Foot pain 04/23/2021   Chronic combined systolic and diastolic congestive heart failure ([place]) 03/02/2021   History of stroke 03/02/2021   Polysubstance abuse ([place]) 03/02/2021   Skin ulcer of right great toe with necrosis of bone ([place])    Encounter for assessment of healthcare decision-making capacity    Pain, dental    Dystonia    Dysarthria    Shock liver 63/33/5456   Acute metabolic encephalopathy 25/63/8937   Coronary artery disease involving native coronary artery of native heart without angina pectoris 01/04/2021   Essential hypertension 01/04/2021   Bipolar I  disorder ([place]) 01/04/2021   Cocaine abuse ([place]) 01/04/2021   Homelessness 01/04/2021   HLD (hyperlipidemia) 01/04/2021   Osteomyelitis of great toe (HCC)    Ulcer of great toe, left, with necrosis of bone (HCC)    Ulcer of great toe, right, with necrosis of muscle (HCC)    Acute osteomyelitis of toe, left (HCC)    Acute on chronic combined systolic and diastolic CHF (congestive heart failure) ([place]) 12/20/2020   Respiratory failure ([place])    Ischemic stroke ([place]) 12/17/2020   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   Diabetic foot ulcer ([place]) 12/08/2020   CHF (congestive heart failure) ([place]) 11/14/2020   CHF exacerbation ([place]) 11/13/2020   Hypokalemia    Hyperlipidemia    Acute hypoxemic respiratory failure ([place]) 10/26/2020   COPD with acute exacerbation ([place]) 10/14/2020   Acute on chronic combined systolic and diastolic CHF (congestive heart failure) ([place]) 10/02/2020   Heart failure with reduced ejection fraction and diastolic dysfunction ([place]) 09/07/2020   Near syncope 07/21/2020   Acid reflux 06/03/2020   Cannabis abuse 03/19/2020   Acute bronchitis due to Rhinovirus 09/08/2019   Schizoaffective disorder ([place]) 06/24/2019   SOB (shortness of breath)    QT prolongation 05/25/2019   Cocaine abuse (HCC)    Elevated troponin    Homeless    Suicidal ideation    Bilateral  lower extremity pain 04/05/2019   Housing situation unstable 03/28/2019   Bilateral lower extremity edema    Acute exacerbation of CHF (congestive heart failure) ([place]) 03/04/2019   Alcohol withdrawal syndrome, with delirium ([place]) 05/03/2018   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   Mild intermittent asthma without complication 03/00/9233   Asthma 12/26/2016   Tobacco use disorder 12/26/2016   Schizophrenia ([place]) 12/26/2016   Severe episode of recurrent major depressive disorder, with psychotic features ([place]) 10/31/2016   Cocaine abuse with cocaine-induced mood disorder ([place]) 09/06/2016    Alcohol use disorder, mild, abuse 07/31/2016   Substance induced mood disorder ([place]) 07/30/2016   Schizoaffective disorder, bipolar type ([place]) 05/31/2016   Cocaine use disorder, severe, dependence ([place]) 05/31/2016   Major depressive disorder, recurrent episode ([place]) 05/30/2016   Suicidal behavior with attempted self-injury ([place]) 05/08/2016   Acetaminophen overdose 05/06/2016   PCP:  Marliss Coots, NP Pharmacy:   [facility] [address] [place] [address] Phone: [phone] Fax: [place] [address] Phone: [phone] Fax: [phone]     Social Determinants of Health (SDOH) Social History: [place]: Food Insecurity Present (11/24/2022)  Housing: Medium Risk (11/24/2022)  Transportation Needs: Unmet Transportation Needs (11/25/2022)  Utilities: Not At Risk (11/24/2022)  Alcohol Screen: Medium Risk (06/24/2019)  Depression (PHQ2-9): Medium Risk (02/10/2021)  Tobacco Use: High Risk (11/24/2022)   SDOH Interventions: Food Insecurity Interventions: Patient Refused (ACTT referral completed) Transportation Interventions: Payor Benefit, Other (Comment), Patient Refused (Pt utilized Medicaid transport and declines additional assistance)   Readmission Risk Interventions    08/18/2022    9:00 AM 07/18/2022    4:11 PM 04/08/2022    1:43 PM  Readmission Risk Prevention Plan  Transportation Screening Complete Complete Complete  Medication Review Press photographer) Complete Complete Complete  PCP or Specialist appointment within 3-5 days of discharge Complete Complete Complete  HRI or Home Care Consult Complete Complete Not Complete  HRI or Home Care Consult Pt Refusal Comments   Pt is homeless  SW Recovery Care/Counseling Consult Complete Complete Complete  Palliative Care Screening Not Applicable  Not [place] Not Applicable Complete Not Applicable

## 2022-11-25 NOTE — Consult Note (Addendum)
WOC Nurse Consult Note: Reason for Consult:LLE with medial and lateral aspect full thickness wounds, lateral >medial. Photos obtained by EDP and uploaded to EMR are appreciated. Patient is known to our service from previous admissions. Dr. Lajoyce Corners (Orthopedics) saw this patient in May of this year and recommended SNF placement so that topical care could be performed routinely. The patient was last seen for wound care in April of this year at the outpatient wound care center. Please see the notes from those encounters. Wound type:venous insufficiency Pressure Injury POA: N/A Measurement:To be obtained by Bedside RN today with application of next dressing change and documented on Nursing Flow Sheet Wound bed:red, moist Drainage (amount, consistency, odor) modecate, serous Periwound:very dry Dressing procedure/placement/frequency:I have provided Nursing with guidance for the topical care of these wounds, using a daily soap and water cleanse, rinse and dry. This is to be followed by application of Eucerin cream to there LLE, but Eucerin is not to be applied over wounds or between digits. Topical care to the wounds will be with a silver hydrofiber (Aquacel Ag+ Advantage, [place] [phone]) topped with ABD pads for comfort. The dressings are to be secured using a Kerlix roll gauze applied from just below toes to just below knee. A 6-inch ACE bandage is to be applied over the Kerlix for mild compression. LEs are to be elevated while in bed. A sacral silicone foam is to be placed prophylactically.  WOC nursing team will not follow, but will remain available to this patient, the nursing and medical teams.  Please re-consult if needed.  Thank you for inviting [place] to participate in this patient's Plan of Care.  Ladona Mow, MSN, RN, CNS, GNP, Leda Min, [employer], [employer] phone:  [phone]

## 2022-11-25 NOTE — Consult Note (Addendum)
[facility] Face-to-Face Psychiatry Consult   Reason for Consult:  [name]  Referring Physician:  Benjaman Lobe Patient Identification: [name] MRN:  [id number] Principal Diagnosis: COPD with acute exacerbation [facility]) Diagnosis:  Principal Problem:   COPD with acute exacerbation (HCC) Active Problems:   Schizoaffective disorder, bipolar type (HCC)   Tobacco use disorder   Homeless   Cocaine abuse (HCC)   Hyperlipidemia   Coronary artery disease involving native coronary artery of native heart without angina pectoris   Chronic combined systolic and diastolic congestive heart failure (HCC)   Type 2 diabetes mellitus with diabetic polyneuropathy, without long-term current use of insulin (HCC)   Open wounds involving multiple regions of lower extremity   Prolonged QT interval   Alcohol abuse   Acid reflux   Total Time spent with patient: 15 minutes  Subjective:   [name] is a 56 y.o. male seen and evaluated face-to-face by this provider.  Consult placed due to suicidal ideations.  He presents with a bright and pleasant affect.  Denying suicidal or homicidal ideations."  Let me just be honest.  I do not want to die but I need help.  I need to go inpatient.  It does not matter whether inpatient.  I just need to go somewhere."  Denies auditory visual hallucinations.  Patient reports his main focus is getting his own housing and possible leg amputation.  Patient does show insight today into worsening cellulitis and possible amputation of left lower extremity.  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  He told me someone will come by today to see me.  I am not quite sure what the plan is. "    [name] states that he is feeling better today, as noted he is in a bright mood, and appears to be appropriate with nursing staff and safety sitter.  He has been compliant with treatment and all  procedures during this admission.  Furthermore he reports compliance with his Abilify at 15 mg, denies outpatient psychiatric services however states the hospital provides him refills and samples upon discharge which helps with his compliance.  Denying any medication side effects i.e. headaches nausea vomiting or dizziness.  Psychiatry continue to follow for medication management and therapy services.  [name] is a 56 y.o. adult with a psychiatric history significant for reported bipolar  disorder, cannabis use disorder, cocaine use disorder, and homelessness, who presented to the [name] for for multiple complaints including leg pain, leg wounds, cough, and suicidality.  Patient states that he is feeling suicidal initially due to the above complaints, and ongoing need for housing and no resources.    The patient's affect, behavior, and treatment request are inconsistent with his reported symptoms of chronic pain and suicidality. The patient is future oriented as demonstrated by requesting a bed, food, admission" heart healthy diet versus regular diet".  He does focus a portion of his interview on permanent housing, and is open to inpatient psychiatry, inpatient rehab, and or long-term nursing facility.  He shows improvement in insight and judgment as evidenced by stating his longest period of sobriety was during his stay at [facility] for 7 months.  His recurrent suicidality is related to factors nonmodifiable by a short inpatient admission (pain, health problems, homelessness). He does not currently have a plan to take his life, but states he just wants to feel safe and have  a place to safe. He does endorse some loneliness and thoughts of grief related to the passing of his mother. He initially denied interest into speaking with the chaplain but later stated he would. The patient has failed to follow up with outpatient psychiatric care following all of his hospitalizations and [name] visits. He has a  consistent pattern of not benefiting from inpatient psychiatric care and the most helpful thing for him scenario would be to establish himself with an outpatient psychiatrist who he can meet with consistently and regularly. The patient has a consistent history of visiting emergency rooms whenever he feels he is unwell or unsafe and is encouraged to always come to the hospital when he has concerns.  Patient has been observed on the medical unit under 24-hour close observation for initially endorses suicidal ideation. Patient ongoing endorsement of suicidal ideation shows a clear evidence of secondary gain of unmet needs (housing, pain management) that is representative of limited and often-maladaptive coping and skills, rather than an indicator of imminent risk of death. Evidence indicates that subsequent suicide attempts by patients who made contingent suicide threats (defined as threatened suicide or exaggerated suicidality) are uncommon in both groups. Hospitalization should not be used as a substitute for social services, substance abuse treatment, and legal assistance for patients who make contingent suicide threats. (Characteristics and six-month outcome of patients who use suicide threats to seek hospital admission. (1996). Psychiatric Services, 47(8), [phone]. Doi:10.1176/ps.47.8.871)  Patient's treatment goals of housing is unobtainable in the short-term inpatient setting.  Although will recommend patient be referred to higher level of care considering multiple comorbidities and risk for physical disability and further limitations.   HPI:  [name] is a 56 y.o. male with medical history significant of asthma/COPD, bipolar 1 disorder, depression, schizoaffective disorder, homelessness, GERD, hypertension, QT prolongation, type 2 diabetes, alcohol abuse, cocaine and cannabis abuse, tobacco use, chronic combined systolic and diastolic heart failure, right AKA who came into the emergency department  due to feeling suicidal since 3 days ago, left lower extremity edema and worsening wounds.  He has not taking his medications since earlier this month.  He has been drinking EtOH, smoking cannabis and using cocaine.  He continues to smoke cigarettes.  His plan to commit suicide was to stab himself in the throat.  He had not taken his wounds bandages since they were placed earlier this month in the emergency department.  He denied fever, chills, rhinorrhea, sore throat, wheezing or hemoptysis.  No chest pain, palpitations, diaphoresis, PND, orthopnea or pitting edema of the lower extremities.  No abdominal pain, nausea, emesis, diarrhea, constipation, melena or hematochezia.  No flank pain, dysuria, frequency or hematuria.  No polyuria, polydipsia, polyphagia or blurred vision.   Past Psychiatric History: See above  Risk to Self:   Yes, chronic no true intent Risk to Others:   Denies Prior Inpatient Therapy:   Denies any recent admission Prior Outpatient Therapy:   Denies  Past Medical History:  Past Medical History:  Diagnosis Date   Asthma    Bipolar 1 disorder (HCC)    CHF (congestive heart failure) (HCC)    COPD (chronic obstructive pulmonary disease) (HCC)    Depression    Diabetes mellitus without complication (HCC)    GERD (gastroesophageal reflux disease)    Homelessness    Hypertension    QT prolongation    Schizoaffective disorder (HCC)     Past Surgical History:  Procedure Laterality Date   AMPUTATION TOE Bilateral 01/02/2021  Procedure: AMPUTATION TOE;  Surgeon: Park Liter, DPM;  Location: [facility] OR;  Service: Podiatry;  Laterality: Bilateral;  Ok to leave patient on hospital bed   IR ANGIO INTRA EXTRACRAN SEL COM CAROTID INNOMINATE BILAT MOD SED  12/17/2020   IR ANGIO VERTEBRAL SEL VERTEBRAL BILAT MOD SED  12/17/2020   RADIOLOGY WITH ANESTHESIA N/A 12/17/2020   Procedure: IR WITH ANESTHESIA;  Surgeon: Radiologist, Medication, MD;  Location: [facility] OR;  Service: Radiology;   Laterality: N/A;   Family History:  Family History  Problem Relation Age of Onset   Drug abuse Mother    Suicidality Mother    Family Psychiatric  History:  Social History:  Social History   Substance and Sexual Activity  Alcohol Use Yes     Social History   Substance and Sexual Activity  Drug Use Yes   Types: Cocaine    Social History   Socioeconomic History   Marital status: Single    Spouse name: Not on file   Number of children: 4   Years of education: Not on file   Highest education level: Not on file  Occupational History   Occupation: N/A  Tobacco Use   Smoking status: Some Days    Years: 30.00    Types: Cigarettes   Smokeless tobacco: Never  Vaping Use   Vaping Use: Never used  Substance and Sexual Activity   Alcohol use: Yes   Drug use: Yes    Types: Cocaine   Sexual activity: Not Currently    Partners: Female    Birth control/protection: None  Other Topics Concern   Not on file  Social History Narrative   ** Merged History Encounter **       Social Determinants of Health   Financial Resource Strain: Not on file  Food Insecurity: Food Insecurity Present (11/24/2022)   Hunger Vital Sign    Worried About Running Out of Food in the Last Year: Often true    Ran Out of Food in the Last Year: Often true  Transportation Needs: Unmet Transportation Needs (11/25/2022)   PRAPARE - Administrator, Civil Service (Medical): No    Lack of Transportation (Non-Medical): Yes  Physical Activity: Not on file  Stress: Not on file  Social Connections: Not on file   Additional Social History:    Allergies:   Allergies  Allergen Reactions   Motrin [Ibuprofen] Anaphylaxis   Tylenol [Acetaminophen] Hives, Nausea And Vomiting and Rash   Lyrica Cr [Pregabalin Er] Other (See Comments)    Dystonia Dysarthria    Peanut-Containing Drug Products Other (See Comments)    Pt denies allergy to peanut butter 03/02/2021    Labs:  Results for orders placed  or performed during the hospital encounter of 11/23/22 (from the past 48 hour(s))  Basic metabolic panel     Status: None   Collection Time: 11/23/22 11:18 PM  Result Value Ref Range   Sodium 139 135 - 145 mmol/L   Potassium 3.6 3.5 - 5.1 mmol/L   Chloride 105 98 - 111 mmol/L   CO2 24 22 - 32 mmol/L   Glucose, Bld 92 70 - 99 mg/dL    Comment: Glucose reference range applies only to samples taken after fasting for at least 8 hours.   BUN 13 6 - 20 mg/dL   Creatinine, Ser 1.02 0.61 - 1.24 mg/dL   Calcium 9.0 8.9 - 72.5 mg/dL   GFR, Estimated >36 >64 mL/min    Comment: (NOTE) Calculated using  the CKD-EPI Creatinine Equation (2021)    Anion gap 10 5 - 15    Comment: Performed at [facility], [address], [place], [name] [address]  CBC with Differential     Status: Abnormal   Collection Time: 11/23/22 11:18 PM  Result Value Ref Range   WBC 9.3 4.0 - 10.5 K/uL   RBC 4.27 4.22 - 5.81 MIL/uL   Hemoglobin 13.0 13.0 - 17.0 g/dL   HCT 60.4 54.0 - 98.1 %   MCV 94.1 80.0 - 100.0 fL   MCH 30.4 26.0 - 34.0 pg   MCHC 32.3 30.0 - 36.0 g/dL   RDW 19.1 (H) 47.8 - 29.5 %   Platelets 305 150 - 400 K/uL   nRBC 0.0 0.0 - 0.2 %   Neutrophils Relative % 58 %   Neutro Abs 5.4 1.7 - 7.7 K/uL   Lymphocytes Relative 29 %   Lymphs Abs 2.7 0.7 - 4.0 K/uL   Monocytes Relative 11 %   Monocytes Absolute 1.0 0.1 - 1.0 K/uL   Eosinophils Relative 1 %   Eosinophils Absolute 0.1 0.0 - 0.5 K/uL   Basophils Relative 1 %   Basophils Absolute 0.1 0.0 - 0.1 K/uL   Immature Granulocytes 0 %   Abs Immature Granulocytes 0.03 0.00 - 0.07 K/uL    Comment: Performed at [facility], [address], [place], [name] [address]  Resp panel by RT-PCR (RSV, Flu A&B, Covid) Anterior Nasal Swab     Status: None   Collection Time: 11/23/22 11:19 PM   Specimen: Anterior Nasal Swab  Result Value Ref Range   SARS Coronavirus 2 by RT PCR NEGATIVE NEGATIVE    Comment:  (NOTE) SARS-CoV-2 target nucleic acids are NOT DETECTED.  The SARS-CoV-2 RNA is generally detectable in upper respiratory specimens during the acute phase of infection. The lowest concentration of SARS-CoV-2 viral copies this assay can detect is 138 copies/mL. A negative result does not preclude SARS-Cov-2 infection and should not be used as the sole basis for treatment or other patient management decisions. A negative result may occur with  improper specimen collection/handling, submission of specimen other than nasopharyngeal swab, presence of viral mutation(s) within the areas targeted by this assay, and inadequate number of viral copies(<138 copies/mL). A negative result must be combined with clinical observations, patient history, and epidemiological information. The expected result is Negative.  Fact Sheet for Patients:  BloggerCourse.com  Fact Sheet for Healthcare Providers:  SeriousBroker.it  This test is no t yet approved or cleared by the [place] FDA and  has been authorized for detection and/or diagnosis of SARS-CoV-2 by FDA under an Emergency Use Authorization (EUA). This EUA will remain  in effect (meaning this test can be used) for the duration of the COVID-19 declaration under Section 564(b)(1) of the Act, 21 U.S.C.section 360bbb-3(b)(1), unless the authorization is terminated  or revoked sooner.       Influenza A by PCR NEGATIVE NEGATIVE   Influenza B by PCR NEGATIVE NEGATIVE    Comment: (NOTE) The Xpert Xpress SARS-CoV-2/FLU/RSV plus assay is intended as an aid in the diagnosis of influenza from Nasopharyngeal swab specimens and should not be used as a sole basis for treatment. Nasal washings and aspirates are unacceptable for Xpert Xpress SARS-CoV-2/FLU/RSV testing.  Fact Sheet for Patients: BloggerCourse.com  Fact Sheet for Healthcare  Providers: SeriousBroker.it  This test is not yet approved or cleared by the [place] FDA and has been authorized for detection and/or  diagnosis of SARS-CoV-2 by FDA under an Emergency Use Authorization (EUA). This EUA will remain in effect (meaning this test can be used) for the duration of the COVID-19 declaration under Section 564(b)(1) of the Act, 21 U.S.C. section 360bbb-3(b)(1), unless the authorization is terminated or revoked.     Resp Syncytial Virus by PCR NEGATIVE NEGATIVE    Comment: (NOTE) Fact Sheet for Patients: BloggerCourse.com  Fact Sheet for Healthcare Providers: SeriousBroker.it  This test is not yet approved or cleared by the [place] FDA and has been authorized for detection and/or diagnosis of SARS-CoV-2 by FDA under an Emergency Use Authorization (EUA). This EUA will remain in effect (meaning this test can be used) for the duration of the COVID-19 declaration under Section 564(b)(1) of the Act, 21 U.S.C. section 360bbb-3(b)(1), unless the authorization is terminated or revoked.  Performed at [facility], [address], [place], [name] [address]   Ethanol     Status: None   Collection Time: 11/23/22 11:59 PM  Result Value Ref Range   Alcohol, Ethyl (B) <10 <10 mg/dL    Comment: (NOTE) Lowest detectable limit for serum alcohol is 10 mg/dL.  For medical purposes only. Performed at [facility], [address] [place], [name] [address]   Brain natriuretic peptide     Status: Abnormal   Collection Time: 11/24/22 12:04 AM  Result Value Ref Range   B Natriuretic Peptide 747.3 (H) 0.0 - 100.0 pg/mL    Comment: Performed at [facility], [address], [place], [name] [address]  Blood culture (routine x 2)     Status: None (Preliminary result)   Collection Time: 11/24/22 12:09 AM   Specimen: BLOOD  Result  Value Ref Range   Specimen Description      BLOOD RIGHT ANTECUBITAL Performed at [facility], [address], [place], [name] [address]    Special Requests      BOTTLES DRAWN AEROBIC AND ANAEROBIC Blood Culture results may not be optimal due to an excessive volume of blood received in culture bottles Performed at [facility], [name], [place], [name] [address]    Culture      NO GROWTH 1 DAY Performed at [facility], [address] [place], [name] [address]    Report Status PENDING   Lactic acid, plasma     Status: None   Collection Time: 11/24/22 12:11 AM  Result Value Ref Range   Lactic Acid, Venous 1.6 0.5 - 1.9 mmol/L    Comment: Performed at [facility], [address] [place], [name] [address]  Urine rapid drug screen (hosp performed)     Status: Abnormal   Collection Time: 11/24/22  2:39 AM  Result Value Ref Range   Opiates NONE DETECTED NONE DETECTED   Cocaine POSITIVE (A) NONE DETECTED   Benzodiazepines NONE DETECTED NONE DETECTED   Amphetamines NONE DETECTED NONE DETECTED   Tetrahydrocannabinol POSITIVE (A) NONE DETECTED   Barbiturates NONE DETECTED NONE DETECTED    Comment: (NOTE) DRUG SCREEN FOR MEDICAL PURPOSES ONLY.  IF CONFIRMATION IS NEEDED FOR ANY PURPOSE, NOTIFY LAB WITHIN 5 DAYS.  LOWEST DETECTABLE LIMITS FOR URINE DRUG SCREEN Drug Class                     Cutoff (ng/mL) Amphetamine and metabolites    1000 Barbiturate and metabolites    200 Benzodiazepine  200 Opiates and metabolites        300 Cocaine and metabolites        300 THC                            50 Performed at [facility], [address], [place], [name] [address]   Blood gas, venous (at [facility] and [facility])     Status: Abnormal   Collection Time: 11/24/22  4:16 AM  Result Value Ref Range   pH, Ven 7.38 7.25 - 7.43   pCO2, Ven 43 (L) 44 - 60 mmHg   pO2, Ven 39 32 - 45 mmHg    Bicarbonate 25.4 20.0 - 28.0 mmol/L   Acid-Base Excess 0.0 0.0 - 2.0 mmol/L   O2 Saturation 64.8 %   Patient temperature 37.0     Comment: Performed at [facility], [address] [place], [name] [address]  CBG monitoring, [name]     Status: Abnormal   Collection Time: 11/24/22 12:28 PM  Result Value Ref Range   Glucose-Capillary 177 (H) 70 - 99 mg/dL    Comment: Glucose reference range applies only to samples taken after fasting for at least 8 hours.   Comment 1 Notify RN    Comment 2 Document in Chart   CBG monitoring, [name]     Status: Abnormal   Collection Time: 11/24/22  5:15 PM  Result Value Ref Range   Glucose-Capillary 193 (H) 70 - 99 mg/dL    Comment: Glucose reference range applies only to samples taken after fasting for at least 8 hours.   Comment 1 Notify RN    Comment 2 Document in Chart   Glucose, capillary     Status: Abnormal   Collection Time: 11/24/22  9:02 PM  Result Value Ref Range   Glucose-Capillary 141 (H) 70 - 99 mg/dL    Comment: Glucose reference range applies only to samples taken after fasting for at least 8 hours.  CBC     Status: Abnormal   Collection Time: 11/25/22  6:06 AM  Result Value Ref Range   WBC 10.7 (H) 4.0 - 10.5 K/uL   RBC 3.63 (L) 4.22 - 5.81 MIL/uL   Hemoglobin 11.0 (L) 13.0 - 17.0 g/dL   HCT 69.4 (L) 85.4 - 62.7 %   MCV 94.8 80.0 - 100.0 fL   MCH 30.3 26.0 - 34.0 pg   MCHC 32.0 30.0 - 36.0 g/dL   RDW 03.5 (H) 00.9 - 38.1 %   Platelets 270 150 - 400 K/uL   nRBC 0.0 0.0 - 0.2 %    Comment: Performed at [facility], [address], [place], [name] [address]  Comprehensive metabolic panel     Status: Abnormal   Collection Time: 11/25/22  6:06 AM  Result Value Ref Range   Sodium 135 135 - 145 mmol/L   Potassium 4.3 3.5 - 5.1 mmol/L   Chloride 104 98 - 111 mmol/L   CO2 23 22 - 32 mmol/L   Glucose, Bld 125 (H) 70 - 99 mg/dL    Comment: Glucose reference range applies only to samples taken after  fasting for at least 8 hours.   BUN 17 6 - 20 mg/dL   Creatinine, Ser 7.16 0.61 - 1.24 mg/dL   Calcium 8.4 (L) 8.9 - 10.3 mg/dL   Total Protein 7.1 6.5 - 8.1 g/dL   Albumin 2.9 (L) 3.5 - 5.0 g/dL  AST 13 (L) 15 - 41 U/L   ALT 10 0 - 44 U/L   Alkaline Phosphatase 65 38 - 126 U/L   Total Bilirubin 0.6 0.3 - 1.2 mg/dL   GFR, Estimated >16 >10 mL/min    Comment: (NOTE) Calculated using the CKD-EPI Creatinine Equation (2021)    Anion gap 8 5 - 15    Comment: Performed at [facility], [address], [place], [name] [address]  Glucose, capillary     Status: Abnormal   Collection Time: 11/25/22  7:52 AM  Result Value Ref Range   Glucose-Capillary 120 (H) 70 - 99 mg/dL    Comment: Glucose reference range applies only to samples taken after fasting for at least 8 hours.  Glucose, capillary     Status: None   Collection Time: 11/25/22 11:29 AM  Result Value Ref Range   Glucose-Capillary 91 70 - 99 mg/dL    Comment: Glucose reference range applies only to samples taken after fasting for at least 8 hours.    Current Facility-Administered Medications  Medication Dose Route Frequency Provider Last Rate Last Admin   albuterol (PROVENTIL) (2.5 MG/3ML) 0.083% nebulizer solution 2.5 mg  2.5 mg Nebulization Q4H PRN Bobette Mo, MD       amLODipine (NORVASC) tablet 10 mg  10 mg Oral Daily Bobette Mo, MD   10 mg at 11/25/22 1024   amoxicillin-clavulanate (AUGMENTIN) 875-125 MG per tablet 1 tablet  1 tablet Oral Q12H Bobette Mo, MD   1 tablet at 11/25/22 1023   budesonide (PULMICORT) nebulizer solution 1 mg  1 mg Nebulization BID Bobette Mo, MD   1 mg at 11/25/22 0815   enoxaparin (LOVENOX) injection 80 mg  80 mg Subcutaneous Q12H Calvert Cantor, MD       folic acid (FOLVITE) tablet 1 mg  1 mg Oral Daily Bobette Mo, MD       furosemide (LASIX) tablet 20 mg  20 mg Oral Daily Bobette Mo, MD   20 mg at 11/25/22 1024   gabapentin  (NEURONTIN) capsule 600 mg  600 mg Oral TID Bobette Mo, MD   600 mg at 11/25/22 1023   guaiFENesin-dextromethorphan (ROBITUSSIN DM) 100-10 MG/5ML syrup 10 mL  10 mL Oral Q4H PRN Bobette Mo, MD   10 mL at 11/25/22 1309   hydrocerin (EUCERIN) cream   Topical Daily Bobette Mo, MD   Given at 11/25/22 1242   insulin aspart (novoLOG) injection 0-15 Units  0-15 Units Subcutaneous TID WC Bobette Mo, MD       ipratropium-albuterol (DUONEB) 0.5-2.5 (3) MG/3ML nebulizer solution 3 mL  3 mL Nebulization BID Calvert Cantor, MD       lisinopril (ZESTRIL) tablet 10 mg  10 mg Oral Daily Bobette Mo, MD   10 mg at 11/25/22 1024   LORazepam (ATIVAN) tablet 1-4 mg  1-4 mg Oral Q1H PRN Bobette Mo, MD       Or   LORazepam (ATIVAN) injection 1-4 mg  1-4 mg Intravenous Q1H PRN Bobette Mo, MD       LORazepam (ATIVAN) tablet 0-4 mg  0-4 mg Oral Q6H Bobette Mo, MD   2 mg at 11/25/22 1230   Followed by   Melene Muller ON 11/26/2022] LORazepam (ATIVAN) tablet 0-4 mg  0-4 mg Oral Q12H Bobette Mo, MD       multivitamin with minerals tablet 1 tablet  1 tablet Oral Daily Robb Matar,  Ernestene Kiel, MD       oxyCODONE (Oxy IR/ROXICODONE) immediate release tablet 5 mg  5 mg Oral Q6H PRN Bobette Mo, MD   5 mg at 11/25/22 1023   prochlorperazine (COMPAZINE) injection 10 mg  10 mg Intravenous Q6H PRN Bobette Mo, MD       thiamine (VITAMIN B1) tablet 100 mg  100 mg Oral Daily Bobette Mo, MD       Or   thiamine (VITAMIN B1) injection 100 mg  100 mg Intravenous Daily Bobette Mo, MD       traZODone (DESYREL) tablet 50 mg  50 mg Oral QHS PRN Bobette Mo, MD        Musculoskeletal:   Psychiatric Specialty Exam:  Presentation  General Appearance: Appropriate for Environment  Eye Contact:Fair  Speech:Clear and Coherent  Speech Volume:Normal  Handedness:Right   Mood and Affect   Mood:Irritable  Affect:Congruent   Thought Process  Thought Processes:Coherent  Descriptions of Associations:Intact  Orientation:Full (Time, Place and Person)  Thought Content:Logical  History of Schizophrenia/Schizoaffective disorder:Yes  Duration of Psychotic Symptoms:Greater than six months  Hallucinations:No data recorded Ideas of Reference:None  Suicidal Thoughts:No data recorded Homicidal Thoughts:No data recorded  Sensorium  Memory:Immediate Fair; Recent Fair  Judgment:Fair  Insight:Fair   Executive Functions  Concentration:Fair  Attention Span:Fair  Recall:Good  Fund of Knowledge:Good  Language:Good   Psychomotor Activity  Psychomotor Activity:No data recorded  Assets  Assets:Desire for Improvement; Leisure Time; Physical Health; Resilience   Sleep  Sleep:No data recorded  Physical Exam: Physical Exam Vitals and nursing note reviewed.  Neurological:     Mental Status: He is alert and oriented to person, place, and time.  Psychiatric:        Attention and Perception: Attention and perception normal.        Mood and Affect: Mood normal.        Speech: Slurred: dysartharia at baseline.        Behavior: Behavior normal. Behavior is cooperative.        Thought Content: Thought content includes suicidal (chronic) ideation. Thought content does not include suicidal (denies plan or intent) plan.        Cognition and Memory: Cognition and memory normal.        Judgment: Judgment normal.    Review of Systems  Psychiatric/Behavioral:  Positive for depression, substance abuse (cocaine, THC, and alcohol) and suicidal ideas (chronic). Negative for hallucinations and memory loss. The patient is nervous/anxious. The patient does not have insomnia.   All other systems reviewed and are negative.  Blood pressure 121/82, pulse (!) 109, temperature 98.3 F (36.8 C), temperature source Oral, resp. rate 20, height 5\' 9"  (1.753 m), weight 81.1 kg, SpO2 94  %. Body mass index is 26.4 kg/m.  Treatment Plan Summary: Medication management and Plan Will reassess x 1.  Continue Abilify 10 mg daily.   Continue Lexapro 5 mg daily Continue gabapentin 400 mg p.o. 3 times daily  TOC consult placed with follow-up with outpatient resources.  Discussed following up with [facility] urgent care walk-in clinic for medication management at discharge  1. Pt does not meet criteria for inpatient psychiatric care or further psychiatric evaluation at this time.  2. No pertinent labwork available for review at this time.. Medical issues or further labwork to consider: None at this time 3. Medication changes or issues to consider: None at this time 4.  Referral for inpatient rehabilitation for substance use.  Due to patient's  frequent emergency room visits, noncompliance, inability to care for self and current medical needs. patient will benefit from door-to-door transfer and or referral for higher level of care particularly group home or nursing facility. 4.Discontinue Recruitment consultant and suicide precautions. 5.  Recommend TOC referral for the above-stated reasons. 6.  Refrain from use of illicit substances, psychoeducation provided around ongoing cocaine and alcohol use. 7.  Referral to a chaplain for grief support   Psychiatry will continue to follow at this time.  Will see x 1 and reassess prior to psychiatrically clearing.  Disposition: No evidence of imminent risk to self or others at present.    Supportive therapy provided about ongoing stressors. Refer to IOP. Discussed crisis plan, support from social network, calling 911, coming to the Emergency Department, and calling Suicide Hotline.  Maryagnes Amos, FNP 11/25/2022 2:23 PM

## 2022-11-25 NOTE — Progress Notes (Signed)
Left lower extremity arterial duplex has been completed. Preliminary results can be found in CV Proc through chart review.   11/25/22 11:43 AM Olen Cordial RVT

## 2022-11-25 NOTE — Progress Notes (Addendum)
[facility] Progress Note  Patient: [name]     ERX:[id number]  DOA: 11/23/2022   PCP: [name], NP       Brief hospital course: [name] is a 56 y.o. male with medical history significant of asthma/COPD, bipolar 1 disorder, depression, schizoaffective disorder, homelessness, GERD, hypertension, QT prolongation, type 2 diabetes, alcohol abuse, cocaine and cannabis abuse, tobacco use, chronic combined systolic and diastolic heart failure, right AKA who came into the emergency department due to feeling suicidal since 3 days ago, left lower extremity edema and worsening wounds.  He has not taking his medications since earlier this month.  He has been drinking EtOH, smoking cannabis and using cocaine. He stated that he planned to commit suicide and was admitted to the hospital for a COPD exacerbation.   Subjective:  Has pain in his left foot.   Assessment and Plan: Principal Problem:   COPD with acute exacerbation - not on steroids- cont nebs  Active Problems: Suicidal intentions - continue sitter    Open wounds involving multiple regions of lower extremity Right BKA - will continue wound care- he is unable to ambulate and foot looks gangrenous - does not appear to have an infection - arterial doppler is normal  H/o chronic PE - refusing Lovenox- resume Elquis    Schizoaffective disorder, bipolar type (HCC) - cont Abilify, Lexapro and Trazodone    Tobacco use disorder, Cocaine use, ETOH abuse - counseled    Homeless- suspect he may need a SNF         Code Status: Full Code Consultants: podiatry Level of Care: Level of care: Telemetry Total time on patient care: 40 min DVT prophylaxis:  Eliquis   Objective:   Vitals:   11/25/22 0637 11/25/22 0810 11/25/22 0816 11/25/22 1600  BP:  121/82  (!) 128/96  Pulse:  (!) 109  (!) 106  Resp:  20  18  Temp:  98.3 F (36.8 C)  98.2 F (36.8 C)  TempSrc:  Oral  Oral  SpO2:  95% 94% 100%  Weight:  81.1 kg     Height:       Filed Weights   11/23/22 2211 11/25/22 0637  Weight: 86.2 kg 81.1 kg   Exam: General exam: Appears comfortable  HEENT: oral mucosa moist Respiratory system: rhonchi bilaterally Cardiovascular system: S1 & S2 heard  Gastrointestinal system: Abdomen soft, non-tender, nondistended. Normal bowel sounds   Extremities: No cyanosis, clubbing - right BKA, left foot has sloughing of skin in multiple places Psychiatry:  Mood & affect appropriate.      CBC: Recent Labs  Lab 11/23/22 2318 11/25/22 0606  WBC 9.3 10.7*  NEUTROABS 5.4  --   HGB 13.0 11.0*  HCT 40.2 34.4*  MCV 94.1 94.8  PLT 305 270   Basic Metabolic Panel: Recent Labs  Lab 11/23/22 2318 11/25/22 0606  NA 139 135  K 3.6 4.3  CL 105 104  CO2 24 23  GLUCOSE 92 125*  BUN 13 17  CREATININE 0.96 0.88  CALCIUM 9.0 8.4*   GFR: Estimated Creatinine Clearance: 96 mL/min (by C-G formula based on SCr of 0.88 mg/dL).  Scheduled Meds:  amLODipine  10 mg Oral Daily   amoxicillin-clavulanate  1 tablet Oral Q12H   budesonide (PULMICORT) nebulizer solution  1 mg Nebulization BID   enoxaparin (LOVENOX) injection  80 mg Subcutaneous Q12H   folic acid  1 mg Oral Daily   furosemide  20 mg Oral Daily   gabapentin  600 mg Oral TID   hydrocerin   Topical Daily   insulin aspart  0-15 Units Subcutaneous TID WC   ipratropium-albuterol  3 mL Nebulization BID   lisinopril  10 mg Oral Daily   LORazepam  0-4 mg Oral Q6H   Followed by   Melene Muller ON 11/26/2022] LORazepam  0-4 mg Oral Q12H   multivitamin with minerals  1 tablet Oral Daily   thiamine  100 mg Oral Daily   Or   thiamine  100 mg Intravenous Daily   Continuous Infusions: Imaging and lab data was personally reviewed VAS [place] LOWER EXTREMITY ARTERIAL DUPLEX  Result Date: 11/25/2022 LOWER EXTREMITY ARTERIAL DUPLEX STUDY Patient Name:  [name]  Date of Exam:   11/25/2022 Medical Rec #: [id number]       Accession #:    [id number] Date of  Birth: 01-23-1966       Patient Gender: M Patient Age:   9 years Exam Location:  [facility] Procedure:      VAS [place] LOWER EXTREMITY ARTERIAL DUPLEX Referring Phys: [name] --------------------------------------------------------------------------------  Indications: Ulceration. High Risk Factors: Hypertension, hyperlipidemia, Diabetes. Other Factors: 01/02/2021 - AMPUTATION TOE Bilateral.  Current ABI: 01/30/2022 - R 1.03 L 1.02 Limitations: patient pain tolerance, patient constant movement Comparison Study: No prior studies. Performing Technologist: [name] RVT  Examination Guidelines: A complete evaluation includes B-mode imaging, spectral Doppler, color Doppler, and power Doppler as needed of all accessible portions of each vessel. Bilateral testing is considered an integral part of a complete examination. Limited examinations for reoccurring indications may be performed as noted.   +-----------+--------+-----+--------+--------+--------+ LEFT       PSV cm/sRatioStenosisWaveformComments +-----------+--------+-----+--------+--------+--------+ CFA Distal 122                  biphasic         +-----------+--------+-----+--------+--------+--------+ DFA        34                   biphasic         +-----------+--------+-----+--------+--------+--------+ SFA Prox   133                  biphasic         +-----------+--------+-----+--------+--------+--------+ SFA Mid    72                   biphasic         +-----------+--------+-----+--------+--------+--------+ SFA Distal 107                  biphasic         +-----------+--------+-----+--------+--------+--------+ POP Prox   96                   biphasic         +-----------+--------+-----+--------+--------+--------+ POP Distal 79                   biphasic         +-----------+--------+-----+--------+--------+--------+ ATA Prox   70                   biphasic          +-----------+--------+-----+--------+--------+--------+ ATA Mid    79                   biphasic         +-----------+--------+-----+--------+--------+--------+ ATA Distal 97  biphasic         +-----------+--------+-----+--------+--------+--------+ PTA Prox   108                  biphasic         +-----------+--------+-----+--------+--------+--------+ PTA Mid    90                   biphasic         +-----------+--------+-----+--------+--------+--------+ PTA Distal 62                   biphasic         +-----------+--------+-----+--------+--------+--------+ PERO Prox  51                   biphasic         +-----------+--------+-----+--------+--------+--------+ PERO Mid   75                   biphasic         +-----------+--------+-----+--------+--------+--------+ PERO Distal47                   biphasic         +-----------+--------+-----+--------+--------+--------+  Summary: Left: Biphasic waveforms noted throughout the left arterial system. No significant areas of stenosis noted.  See table(s) above for measurements and observations.    Preliminary    CT Angio Chest PE W and/or Wo Contrast  Result Date: 11/24/2022 CLINICAL DATA:  Pulmonary embolism suspected, high probability. Left leg swelling, cough, wheezing, and shortness of breath. EXAM: CT ANGIOGRAPHY CHEST WITH CONTRAST TECHNIQUE: Multidetector CT imaging of the chest was performed using the standard protocol during bolus administration of intravenous contrast. Multiplanar CT image reconstructions and MIPs were obtained to evaluate the vascular anatomy. RADIATION DOSE REDUCTION: This exam was performed according to the departmental dose-optimization program which includes automated exposure control, adjustment of the mA and/or kV according to patient size and/or use of iterative reconstruction technique. CONTRAST:  7mL OMNIPAQUE IOHEXOL 350 MG/ML SOLN COMPARISON:  08/16/2022.  FINDINGS: Cardiovascular: The heart is enlarged and there is no pericardial effusion. Scattered coronary artery calcifications are noted. There is atherosclerotic calcification of the aorta without evidence of aneurysm. The pulmonary trunk is distended suggesting underlying pulmonary artery hypertension. A small segmental pulmonary artery filling defect is identified in the right lower lobe and left lower lobe. No evidence of right heart strain. Mediastinum/Nodes: No mediastinal lymphadenopathy. There is an enlarged lymph node at the right hilum measuring 1.8 cm. No axillary lymphadenopathy. The thyroid gland, trachea, and esophagus are within normal limits. Lungs/Pleura: Advanced emphysematous changes are present in the lungs. Bronchial wall thickening is noted bilaterally with patchy airspace disease in the lower lobes bilaterally and right middle lobe. There is mild residual consolidation in the posterior aspect of the right upper lobe. No effusion or pneumothorax. Upper Abdomen: There is reflux of contrast into the inferior vena cava and hepatic veins, suggesting right heart failure. No acute abnormality. Musculoskeletal: Degenerative changes in the thoracic spine. No acute osseous abnormality. Review of the MIP images confirms the above findings. IMPRESSION: 1. Small segmental pulmonary emboli in the lower lobes bilaterally, which may be residual embolus from prior pulmonary embolism in 08/16/2022 and decreased from prior exam. No evidence of right heart strain. 2. Bronchial wall thickening bilaterally with patchy airspace disease in the lower lobes bilaterally and right middle lobe. Mild residual airspace disease in the right upper lobe. 3. Emphysema. 4. Cardiomegaly with coronary artery calcifications. 5. Distended pulmonary trunk  suggesting underlying pulmonary artery hypertension. 6. Aortic atherosclerosis. Electronically Signed   By: Thornell Sartorius M.D.   On: 11/24/2022 03:43   DG Chest Port 1  View  Result Date: 11/23/2022 CLINICAL DATA:  Cough. EXAM: PORTABLE CHEST 1 VIEW COMPARISON:  08/20/2022, 08/16/2022. FINDINGS: Heart is enlarged and the mediastinal contour is within normal limits. Persistent opacity is noted in the mid right lung which may be associated with previous pulmonary infarction. The left lung is clear. No effusion or pneumothorax. No acute osseous abnormality IMPRESSION: 1. Persistent opacity in the mid right lung which may be associated with previously described pulmonary infarct. The possibility of superimposed infection can not be excluded. 2. Cardiomegaly. Electronically Signed   By: Thornell Sartorius M.D.   On: 11/23/2022 23:55   DG Foot Complete Left  Result Date: 11/23/2022 CLINICAL DATA:  Open wounds on medial and lateral lateral malleoli. Concern for osteomyelitis. EXAM: LEFT FOOT - COMPLETE 3+ VIEW; LEFT ANKLE COMPLETE - 3+ VIEW COMPARISON:  None Available. FINDINGS: There is no evidence of fracture or dislocation. No periosteal elevation or bony erosion. Large calcaneal spur. 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 Degenerative changes are present in the midfoot. Soft tissue swelling is present about the ankle and over the dorsum of the foot. IMPRESSION: No radiographic evidence of osteomyelitis. Electronically Signed   By: Thornell Sartorius M.D.   On: 11/23/2022 23:52   DG Ankle Complete Left  Result Date: 11/23/2022 CLINICAL DATA:  Open wounds on medial and lateral lateral malleoli. Concern for osteomyelitis. EXAM: LEFT FOOT - COMPLETE 3+ VIEW; LEFT ANKLE COMPLETE - 3+ VIEW COMPARISON:  None Available. FINDINGS: There is no evidence of fracture or dislocation. No periosteal elevation or bony erosion. Large calcaneal spur. 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 Degenerative changes are present in the midfoot. Soft tissue swelling is present about the ankle and over the dorsum of the foot. IMPRESSION: No radiographic evidence of osteomyelitis. Electronically  Signed   By: Thornell Sartorius M.D.   On: 11/23/2022 23:52    LOS: 0 days   Author: Calvert Cantor  11/25/2022 4:22 PM  To contact [facility]>   Check the care team in [facility] and look for the attending/consulting [facility] provider listed  Log into [email or web address] and use [facility] universal password   Go to> "[facility]"  and find provider  If you still have difficulty reaching the provider, please page the [facility] (Director on Call) for the Hospitalists listed on amion

## 2022-11-25 NOTE — Consult Note (Signed)
Reason for Consult: Ulcers left ankle Referring Physician: Dr. Shelba Flake [name] is an 56 y.o. male.  HPI: He has had multiple ulcerations on the bilateral ankles he has a dressings applied and intermittently presents to the ER for care of these has not followed up as an outpatient in some time  Past Medical History:  Diagnosis Date   Asthma    Bipolar 1 disorder (HCC)    CHF (congestive heart failure) (HCC)    COPD (chronic obstructive pulmonary disease) (HCC)    Depression    Diabetes mellitus without complication (HCC)    GERD (gastroesophageal reflux disease)    Homelessness    Hypertension    QT prolongation    Schizoaffective disorder (HCC)     Past Surgical History:  Procedure Laterality Date   AMPUTATION TOE Bilateral 01/02/2021   Procedure: AMPUTATION TOE;  Surgeon: Park Liter, DPM;  Location: [facility] OR;  Service: Podiatry;  Laterality: Bilateral;  Ok to leave patient on hospital bed   IR ANGIO INTRA EXTRACRAN SEL COM CAROTID INNOMINATE BILAT MOD SED  12/17/2020   IR ANGIO VERTEBRAL SEL VERTEBRAL BILAT MOD SED  12/17/2020   RADIOLOGY WITH ANESTHESIA N/A 12/17/2020   Procedure: IR WITH ANESTHESIA;  Surgeon: Radiologist, Medication, MD;  Location: [facility] OR;  Service: Radiology;  Laterality: N/A;    Family History  Problem Relation Age of Onset   Drug abuse Mother    Suicidality Mother     Social History:  reports that he has been smoking cigarettes. He has never used smokeless tobacco. He reports current alcohol use. He reports current drug use. Drug: Cocaine.  Allergies:  Allergies  Allergen Reactions   Motrin [Ibuprofen] Anaphylaxis   Tylenol [Acetaminophen] Hives, Nausea And Vomiting and Rash   Lyrica Cr [Pregabalin Er] Other (See Comments)    Dystonia Dysarthria    Peanut-Containing Drug Products Other (See Comments)    Pt denies allergy to peanut butter 03/02/2021    Medications: I have reviewed the patient's current medications.  Results for orders  placed or performed during the hospital encounter of 11/23/22 (from the past 48 hour(s))  Basic metabolic panel     Status: None   Collection Time: 11/23/22 11:18 PM  Result Value Ref Range   Sodium 139 135 - 145 mmol/L   Potassium 3.6 3.5 - 5.1 mmol/L   Chloride 105 98 - 111 mmol/L   CO2 24 22 - 32 mmol/L   Glucose, Bld 92 70 - 99 mg/dL    Comment: Glucose reference range applies only to samples taken after fasting for at least 8 hours.   BUN 13 6 - 20 mg/dL   Creatinine, Ser 8.28 0.61 - 1.24 mg/dL   Calcium 9.0 8.9 - 00.3 mg/dL   GFR, Estimated >49 >17 mL/min    Comment: (NOTE) Calculated using the CKD-EPI Creatinine Equation (2021)    Anion gap 10 5 - 15    Comment: Performed at [facility], [address], [place], [name] [address]  CBC with Differential     Status: Abnormal   Collection Time: 11/23/22 11:18 PM  Result Value Ref Range   WBC 9.3 4.0 - 10.5 K/uL   RBC 4.27 4.22 - 5.81 MIL/uL   Hemoglobin 13.0 13.0 - 17.0 g/dL   HCT 69.7 94.8 - 01.6 %   MCV 94.1 80.0 - 100.0 fL   MCH 30.4 26.0 - 34.0 pg   MCHC 32.3 30.0 - 36.0 g/dL   RDW 55.3 (  H) 11.5 - 15.5 %   Platelets 305 150 - 400 K/uL   nRBC 0.0 0.0 - 0.2 %   Neutrophils Relative % 58 %   Neutro Abs 5.4 1.7 - 7.7 K/uL   Lymphocytes Relative 29 %   Lymphs Abs 2.7 0.7 - 4.0 K/uL   Monocytes Relative 11 %   Monocytes Absolute 1.0 0.1 - 1.0 K/uL   Eosinophils Relative 1 %   Eosinophils Absolute 0.1 0.0 - 0.5 K/uL   Basophils Relative 1 %   Basophils Absolute 0.1 0.0 - 0.1 K/uL   Immature Granulocytes 0 %   Abs Immature Granulocytes 0.03 0.00 - 0.07 K/uL    Comment: Performed at [facility], [address] [place], [name] [address]  Resp panel by RT-PCR (RSV, Flu A&B, Covid) Anterior Nasal Swab     Status: None   Collection Time: 11/23/22 11:19 PM   Specimen: Anterior Nasal Swab  Result Value Ref Range   SARS Coronavirus 2 by RT PCR NEGATIVE NEGATIVE    Comment:  (NOTE) SARS-CoV-2 target nucleic acids are NOT DETECTED.  The SARS-CoV-2 RNA is generally detectable in upper respiratory specimens during the acute phase of infection. The lowest concentration of SARS-CoV-2 viral copies this assay can detect is 138 copies/mL. A negative result does not preclude SARS-Cov-2 infection and should not be used as the sole basis for treatment or other patient management decisions. A negative result may occur with  improper specimen collection/handling, submission of specimen other than nasopharyngeal swab, presence of viral mutation(s) within the areas targeted by this assay, and inadequate number of viral copies(<138 copies/mL). A negative result must be combined with clinical observations, patient history, and epidemiological information. The expected result is Negative.  Fact Sheet for Patients:  BloggerCourse.com  Fact Sheet for Healthcare Providers:  SeriousBroker.it  This test is no t yet approved or cleared by the [place] FDA and  has been authorized for detection and/or diagnosis of SARS-CoV-2 by FDA under an Emergency Use Authorization (EUA). This EUA will remain  in effect (meaning this test can be used) for the duration of the COVID-19 declaration under Section 564(b)(1) of the Act, 21 U.S.C.section 360bbb-3(b)(1), unless the authorization is terminated  or revoked sooner.       Influenza A by PCR NEGATIVE NEGATIVE   Influenza B by PCR NEGATIVE NEGATIVE    Comment: (NOTE) The Xpert Xpress SARS-CoV-2/FLU/RSV plus assay is intended as an aid in the diagnosis of influenza from Nasopharyngeal swab specimens and should not be used as a sole basis for treatment. Nasal washings and aspirates are unacceptable for Xpert Xpress SARS-CoV-2/FLU/RSV testing.  Fact Sheet for Patients: BloggerCourse.com  Fact Sheet for Healthcare  Providers: SeriousBroker.it  This test is not yet approved or cleared by the [place] FDA and has been authorized for detection and/or diagnosis of SARS-CoV-2 by FDA under an Emergency Use Authorization (EUA). This EUA will remain in effect (meaning this test can be used) for the duration of the COVID-19 declaration under Section 564(b)(1) of the Act, 21 U.S.C. section 360bbb-3(b)(1), unless the authorization is terminated or revoked.     Resp Syncytial Virus by PCR NEGATIVE NEGATIVE    Comment: (NOTE) Fact Sheet for Patients: BloggerCourse.com  Fact Sheet for Healthcare Providers: SeriousBroker.it  This test is not yet approved or cleared by the [place] FDA and has been authorized for detection and/or diagnosis of SARS-CoV-2 by FDA under an Emergency Use Authorization (EUA). This EUA will remain in effect (  meaning this test can be used) for the duration of the COVID-19 declaration under Section 564(b)(1) of the Act, 21 U.S.C. section 360bbb-3(b)(1), unless the authorization is terminated or revoked.  Performed at [facility], [address] [place], [name] [address]   Ethanol     Status: None   Collection Time: 11/23/22 11:59 PM  Result Value Ref Range   Alcohol, Ethyl (B) <10 <10 mg/dL    Comment: (NOTE) Lowest detectable limit for serum alcohol is 10 mg/dL.  For medical purposes only. Performed at [facility], [address], [place], [name] [address]   Brain natriuretic peptide     Status: Abnormal   Collection Time: 11/24/22 12:04 AM  Result Value Ref Range   B Natriuretic Peptide 747.3 (H) 0.0 - 100.0 pg/mL    Comment: Performed at [facility], [address], [place], [name] [address]  Lactic acid, plasma     Status: None   Collection Time: 11/24/22 12:11 AM  Result Value Ref Range   Lactic Acid, Venous 1.6 0.5 -  1.9 mmol/L    Comment: Performed at [facility], [address], [place], [name] [address]  Urine rapid drug screen (hosp performed)     Status: Abnormal   Collection Time: 11/24/22  2:39 AM  Result Value Ref Range   Opiates NONE DETECTED NONE DETECTED   Cocaine POSITIVE (A) NONE DETECTED   Benzodiazepines NONE DETECTED NONE DETECTED   Amphetamines NONE DETECTED NONE DETECTED   Tetrahydrocannabinol POSITIVE (A) NONE DETECTED   Barbiturates NONE DETECTED NONE DETECTED    Comment: (NOTE) DRUG SCREEN FOR MEDICAL PURPOSES ONLY.  IF CONFIRMATION IS NEEDED FOR ANY PURPOSE, NOTIFY LAB WITHIN 5 DAYS.  LOWEST DETECTABLE LIMITS FOR URINE DRUG SCREEN Drug Class                     Cutoff (ng/mL) Amphetamine and metabolites    1000 Barbiturate and metabolites    200 Benzodiazepine                 200 Opiates and metabolites        300 Cocaine and metabolites        300 THC                            50 Performed at [facility], [address], [place], [name] [address]   Blood gas, venous (at [facility])     Status: Abnormal   Collection Time: 11/24/22  4:16 AM  Result Value Ref Range   pH, Ven 7.38 7.25 - 7.43   pCO2, Ven 43 (L) 44 - 60 mmHg   pO2, Ven 39 32 - 45 mmHg   Bicarbonate 25.4 20.0 - 28.0 mmol/L   Acid-Base Excess 0.0 0.0 - 2.0 mmol/L   O2 Saturation 64.8 %   Patient temperature 37.0     Comment: Performed at [facility], [address] [place], [name] [address]  CBG monitoring, ED     Status: Abnormal   Collection Time: 11/24/22 12:28 PM  Result Value Ref Range   Glucose-Capillary 177 (H) 70 - 99 mg/dL    Comment: Glucose reference range applies only to samples taken after fasting for at least 8 hours.   Comment 1 Notify RN    Comment 2 Document in Chart   CBG monitoring, ED  Status: Abnormal   Collection Time: 11/24/22  5:15 PM  Result Value Ref Range   Glucose-Capillary 193 (H) 70 - 99 mg/dL     Comment: Glucose reference range applies only to samples taken after fasting for at least 8 hours.   Comment 1 Notify RN    Comment 2 Document in Chart   Glucose, capillary     Status: Abnormal   Collection Time: 11/24/22  9:02 PM  Result Value Ref Range   Glucose-Capillary 141 (H) 70 - 99 mg/dL    Comment: Glucose reference range applies only to samples taken after fasting for at least 8 hours.  CBC     Status: Abnormal   Collection Time: 11/25/22  6:06 AM  Result Value Ref Range   WBC 10.7 (H) 4.0 - 10.5 K/uL   RBC 3.63 (L) 4.22 - 5.81 MIL/uL   Hemoglobin 11.0 (L) 13.0 - 17.0 g/dL   HCT 40.9 (L) 81.1 - 91.4 %   MCV 94.8 80.0 - 100.0 fL   MCH 30.3 26.0 - 34.0 pg   MCHC 32.0 30.0 - 36.0 g/dL   RDW 78.2 (H) 95.6 - 21.3 %   Platelets 270 150 - 400 K/uL   nRBC 0.0 0.0 - 0.2 %    Comment: Performed at [facility], [address] [place], [name] [address]  Comprehensive metabolic panel     Status: Abnormal   Collection Time: 11/25/22  6:06 AM  Result Value Ref Range   Sodium 135 135 - 145 mmol/L   Potassium 4.3 3.5 - 5.1 mmol/L   Chloride 104 98 - 111 mmol/L   CO2 23 22 - 32 mmol/L   Glucose, Bld 125 (H) 70 - 99 mg/dL    Comment: Glucose reference range applies only to samples taken after fasting for at least 8 hours.   BUN 17 6 - 20 mg/dL   Creatinine, Ser 8.46 0.61 - 1.24 mg/dL   Calcium 8.4 (L) 8.9 - 10.3 mg/dL   Total Protein 7.1 6.5 - 8.1 g/dL   Albumin 2.9 (L) 3.5 - 5.0 g/dL   AST 13 (L) 15 - 41 U/L   ALT 10 0 - 44 U/L   Alkaline Phosphatase 65 38 - 126 U/L   Total Bilirubin 0.6 0.3 - 1.2 mg/dL   GFR, Estimated >96 >29 mL/min    Comment: (NOTE) Calculated using the CKD-EPI Creatinine Equation (2021)    Anion gap 8 5 - 15    Comment: Performed at [facility], [address], [place], [name] [address]  Glucose, capillary     Status: Abnormal   Collection Time: 11/25/22  7:52 AM  Result Value Ref Range   Glucose-Capillary 120  (H) 70 - 99 mg/dL    Comment: Glucose reference range applies only to samples taken after fasting for at least 8 hours.    CT Angio Chest PE W and/or Wo Contrast  Result Date: 11/24/2022 CLINICAL DATA:  Pulmonary embolism suspected, high probability. Left leg swelling, cough, wheezing, and shortness of breath. EXAM: CT ANGIOGRAPHY CHEST WITH CONTRAST TECHNIQUE: Multidetector CT imaging of the chest was performed using the standard protocol during bolus administration of intravenous contrast. Multiplanar CT image reconstructions and MIPs were obtained to evaluate the vascular anatomy. RADIATION DOSE REDUCTION: This exam was performed according to the departmental dose-optimization program which includes automated exposure control, adjustment of the mA and/or kV according to patient size and/or use of iterative reconstruction technique. CONTRAST:  75mL OMNIPAQUE IOHEXOL  350 MG/ML SOLN COMPARISON:  08/16/2022. FINDINGS: Cardiovascular: The heart is enlarged and there is no pericardial effusion. Scattered coronary artery calcifications are noted. There is atherosclerotic calcification of the aorta without evidence of aneurysm. The pulmonary trunk is distended suggesting underlying pulmonary artery hypertension. A small segmental pulmonary artery filling defect is identified in the right lower lobe and left lower lobe. No evidence of right heart strain. Mediastinum/Nodes: No mediastinal lymphadenopathy. There is an enlarged lymph node at the right hilum measuring 1.8 cm. No axillary lymphadenopathy. The thyroid gland, trachea, and esophagus are within normal limits. Lungs/Pleura: Advanced emphysematous changes are present in the lungs. Bronchial wall thickening is noted bilaterally with patchy airspace disease in the lower lobes bilaterally and right middle lobe. There is mild residual consolidation in the posterior aspect of the right upper lobe. No effusion or pneumothorax. Upper Abdomen: There is reflux of  contrast into the inferior vena cava and hepatic veins, suggesting right heart failure. No acute abnormality. Musculoskeletal: Degenerative changes in the thoracic spine. No acute osseous abnormality. Review of the MIP images confirms the above findings. IMPRESSION: 1. Small segmental pulmonary emboli in the lower lobes bilaterally, which may be residual embolus from prior pulmonary embolism in 08/16/2022 and decreased from prior exam. No evidence of right heart strain. 2. Bronchial wall thickening bilaterally with patchy airspace disease in the lower lobes bilaterally and right middle lobe. Mild residual airspace disease in the right upper lobe. 3. Emphysema. 4. Cardiomegaly with coronary artery calcifications. 5. Distended pulmonary trunk suggesting underlying pulmonary artery hypertension. 6. Aortic atherosclerosis. Electronically Signed   By: Thornell Sartorius M.D.   On: 11/24/2022 03:43   DG Chest Port 1 View  Result Date: 11/23/2022 CLINICAL DATA:  Cough. EXAM: PORTABLE CHEST 1 VIEW COMPARISON:  08/20/2022, 08/16/2022. FINDINGS: Heart is enlarged and the mediastinal contour is within normal limits. Persistent opacity is noted in the mid right lung which may be associated with previous pulmonary infarction. The left lung is clear. No effusion or pneumothorax. No acute osseous abnormality IMPRESSION: 1. Persistent opacity in the mid right lung which may be associated with previously described pulmonary infarct. The possibility of superimposed infection can not be excluded. 2. Cardiomegaly. Electronically Signed   By: Thornell Sartorius M.D.   On: 11/23/2022 23:55   DG Foot Complete Left  Result Date: 11/23/2022 CLINICAL DATA:  Open wounds on medial and lateral lateral malleoli. Concern for osteomyelitis. EXAM: LEFT FOOT - COMPLETE 3+ VIEW; LEFT ANKLE COMPLETE - 3+ VIEW COMPARISON:  None Available. FINDINGS: There is no evidence of fracture or dislocation. No periosteal elevation or bony erosion. Large calcaneal  spur. 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 Degenerative changes are present in the midfoot. Soft tissue swelling is present about the ankle and over the dorsum of the foot. IMPRESSION: No radiographic evidence of osteomyelitis. Electronically Signed   By: Thornell Sartorius M.D.   On: 11/23/2022 23:52   DG Ankle Complete Left  Result Date: 11/23/2022 CLINICAL DATA:  Open wounds on medial and lateral lateral malleoli. Concern for osteomyelitis. EXAM: LEFT FOOT - COMPLETE 3+ VIEW; LEFT ANKLE COMPLETE - 3+ VIEW COMPARISON:  None Available. FINDINGS: There is no evidence of fracture or dislocation. No periosteal elevation or bony erosion. Large calcaneal spur. 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 Degenerative changes are present in the midfoot. Soft tissue swelling is present about the ankle and over the dorsum of the foot. IMPRESSION: No radiographic evidence of osteomyelitis. Electronically Signed  By: Thornell Sartorius M.D.   On: 11/23/2022 23:52    ROS Blood pressure 121/82, pulse (!) 109, temperature 98.3 F (36.8 C), temperature source Oral, resp. rate 20, height 5\' 9"  (1.753 m), weight 81.1 kg, SpO2 94 %.  Vitals:   11/25/22 0810 11/25/22 0816  BP: 121/82   Pulse: (!) 109   Resp: 20   Temp: 98.3 F (36.8 C)   SpO2: 95% 94%    General AA&O x3. Normal mood and affect.  Vascular Left leg nonpalpable DP and PT pulse, foot is warm and the ankle cool to the toes  Neurologic Epicritic sensation grossly absent.  Dermatologic (Wound) Full-thickness ulcer medial and lateral ankle, lateral ankle is worse, no purulence warmth cellulitis or signs of local infection  Orthopedic: Motor intact BLE.    Assessment/Plan:  Ulcers left ankle -Imaging: Studies independently reviewed.  Do not think MRI is necessary at this point.  I did recommend ABIs and ordered these as he has multiple risk factors for PAD and would like to rule this out prior to recommending compression therapy -Antibiotics:  P.o. Augmentin should be sufficient at this point -WB Status: WBAT -Wound Care: Wound care nurse made recommendations this morning for compression, agree with this and these should be followed -Surgical Plan: No surgical plans.  He can follow-up as an outpatient with the wound care center he has been there before  11/27/22 11/25/2022, 8:32 AM   Best available via secure chat for questions or concerns.

## 2022-11-26 DIAGNOSIS — L03116 Cellulitis of left lower limb: Secondary | ICD-10-CM | POA: Diagnosis not present

## 2022-11-26 DIAGNOSIS — F32A Depression, unspecified: Secondary | ICD-10-CM

## 2022-11-26 DIAGNOSIS — F1410 Cocaine abuse, uncomplicated: Secondary | ICD-10-CM

## 2022-11-26 DIAGNOSIS — F1210 Cannabis abuse, uncomplicated: Secondary | ICD-10-CM

## 2022-11-26 DIAGNOSIS — J441 Chronic obstructive pulmonary disease with (acute) exacerbation: Secondary | ICD-10-CM | POA: Diagnosis not present

## 2022-11-26 DIAGNOSIS — Z5900 Homelessness unspecified: Secondary | ICD-10-CM

## 2022-11-26 DIAGNOSIS — F1010 Alcohol abuse, uncomplicated: Secondary | ICD-10-CM

## 2022-11-26 DIAGNOSIS — R45851 Suicidal ideations: Secondary | ICD-10-CM

## 2022-11-26 LAB — BASIC METABOLIC PANEL
Anion gap: 9 (ref 5–15)
BUN: 20 mg/dL (ref 6–20)
CO2: 22 mmol/L (ref 22–32)
Calcium: 8.4 mg/dL — ABNORMAL LOW (ref 8.9–10.3)
Chloride: 107 mmol/L (ref 98–111)
Creatinine, Ser: 0.98 mg/dL (ref 0.61–1.24)
GFR, Estimated: >60 mL/min (ref >60)
Glucose, Bld: 105 mg/dL — ABNORMAL HIGH (ref 70–99)
Potassium: 4.4 mmol/L (ref 3.5–5.1)
Sodium: 138 mmol/L (ref 135–145)

## 2022-11-26 LAB — GLUCOSE, CAPILLARY
Glucose-Capillary: 88 mg/dL (ref 70–99)
Glucose-Capillary: 90 mg/dL (ref 70–99)
Glucose-Capillary: 237 mg/dL — ABNORMAL HIGH (ref 70–99)
Glucose-Capillary: 162 mg/dL — ABNORMAL HIGH (ref 70–99)

## 2022-11-26 MED ORDER — PREDNISONE 20 MG PO TABS
40.0000 mg | ORAL_TABLET | Freq: Every day | ORAL | Status: DC
  Administered 2022-11-26 – 2022-12-02 (×7): 40 mg via ORAL
  Filled 2022-11-26 (×7): qty 2

## 2022-11-26 NOTE — Hospital Course (Signed)
56 y.o. male with medical history significant of asthma/COPD, bipolar 1 disorder, depression, schizoaffective disorder, homelessness, GERD, hypertension, QT prolongation, type 2 diabetes, alcohol abuse, cocaine and cannabis abuse, tobacco use, chronic combined systolic and diastolic heart failure, right AKA who came into the emergency department due to feeling suicidal since 3 days ago, left lower extremity edema and worsening wounds.  He has not taking his medications since earlier this month.  He has been drinking EtOH, smoking cannabis and using cocaine. He stated that he planned to commit suicide and was admitted to the hospital for a COPD exacerbation.

## 2022-11-26 NOTE — Progress Notes (Signed)
PT Cancellation Note  Patient Details Name: [name] MRN: [id number] DOB: 1966/04/01   Cancelled Treatment:    Reason Eval/Treat Not Completed: (P) Patient declined, no reason specified; pt asleep upon entry and upon being awoken requesting PT to "just let me sleep." We check back as pt status and schedule allows which may be another day.  Jamesetta Geralds, PT, DPT [facility] Department Office: [phone] Weekend pager: [phone]   Jamesetta Geralds 11/26/2022, 10:04 AM

## 2022-11-26 NOTE — Consult Note (Signed)
[facility] Face-to-Face Psychiatry Consult   Reason for Consult:  Suicidal Ideaitons Referring Physician:  Hughie Closs Patient Identification: [name] MRN:  [id number] Principal Diagnosis: COPD with acute exacerbation [facility]) Diagnosis:  Principal Problem:   COPD with acute exacerbation (HCC) Active Problems:   Schizoaffective disorder, bipolar type (HCC)   Tobacco use disorder   Homeless   Cocaine abuse (HCC)   Hyperlipidemia   Coronary artery disease involving native coronary artery of native heart without angina pectoris   Chronic combined systolic and diastolic congestive heart failure (HCC)   Type 2 diabetes mellitus with diabetic polyneuropathy, without long-term current use of insulin (HCC)   Open wounds involving multiple regions of lower extremity   Prolonged QT interval   Alcohol abuse   Acid reflux   COPD exacerbation (HCC)   Total Time spent with patient: 15 minutes  Subjective:   [name] is a 56 y.o. male seen and evaluated face-to-face by this provider.  Patient is well-known to this service.  Continues to endorse suicidal ideations related to pain and homelessness.  Denies that he has followed up with outpatient resources due to inability to access resources.  Education provided with virtual options.  Patient was receptive to plan.  TOC transition of care referral was placed to assist with additional outpatient resources for substance abuse and medication management.  Consider group home and/or skilled nursing facility referrals.  He reports a good appetite.  States he is resting " okay throughout the night" patient remains psychiatrically cleared.  Psychiatry will sign off at this time.  Case staffed with attending psychiatrist MD Lucianne Muss. Support, encouragement reassurance was provided.   During evaluation [name] is resting in bed in no acute distress. Dose report chronic pain and depression. he is alert/oriented x 3; calm/cooperative; and mood congruent with  affect. He is speaking in a clear tone at moderate volume, and normal pace; with no eye contact. his thought process is coherent and relevant; There is no indication that he is currently responding to internal/external stimuli or experiencing delusional thought content; and he has denied psychosis, and paranoia. Patient has remained calm throughout assessment and has answered questions appropriately.    [name] is educated and verbalizes understanding of mental health resources and other crisis services in the community. he is instructed to call 911 and present to the nearest emergency room should he experience any detrimental worsening of his mental health condition. he was a also advised by Clinical research associate that he could call the toll-free phone on back of  insurance card to assist with identifying in network counselors and agencies or number on back of Medicaid card to speak with care coordinator.   HPI: "[name] is a 56 y.o. male with medical history significant of asthma/COPD, bipolar 1 disorder, depression, schizoaffective disorder, homelessness, GERD, hypertension, QT prolongation, type 2 diabetes, alcohol abuse, cocaine and cannabis abuse, tobacco use, chronic combined systolic and diastolic heart failure, right AKA who came into the emergency department due to feeling suicidal since 3 days ago, left lower extremity edema and worsening wounds.  He has not taking his medications since earlier this month.  He has been drinking EtOH, smoking cannabis and using cocaine.  He continues to smoke cigarettes.  His plan to commit suicide was to stab himself in the throat.  He had not taken his wounds bandages since they were placed earlier this month in the emergency department.  He denied fever, chills, rhinorrhea, sore throat, wheezing or hemoptysis.  No chest pain,  palpitations, diaphoresis, PND, orthopnea or pitting edema of the lower extremities.  No abdominal pain, nausea, emesis, diarrhea, constipation,  melena or hematochezia.  No flank pain, dysuria, frequency or hematuria.  No polyuria, polydipsia, polyphagia or blurred vision."  Past Psychiatric History:   Risk to Self:   Risk to Others:   Prior Inpatient Therapy:   Prior Outpatient Therapy:    Past Medical History:  Past Medical History:  Diagnosis Date   Asthma    Bipolar 1 disorder (HCC)    CHF (congestive heart failure) (HCC)    COPD (chronic obstructive pulmonary disease) (HCC)    Depression    Diabetes mellitus without complication (HCC)    GERD (gastroesophageal reflux disease)    Homelessness    Hypertension    QT prolongation    Schizoaffective disorder (HCC)     Past Surgical History:  Procedure Laterality Date   AMPUTATION TOE Bilateral 01/02/2021   Procedure: AMPUTATION TOE;  Surgeon: Park Liter, DPM;  Location: [facility] OR;  Service: Podiatry;  Laterality: Bilateral;  Ok to leave patient on hospital bed   IR ANGIO INTRA EXTRACRAN SEL COM CAROTID INNOMINATE BILAT MOD SED  12/17/2020   IR ANGIO VERTEBRAL SEL VERTEBRAL BILAT MOD SED  12/17/2020   RADIOLOGY WITH ANESTHESIA N/A 12/17/2020   Procedure: IR WITH ANESTHESIA;  Surgeon: Radiologist, Medication, MD;  Location: [facility] OR;  Service: Radiology;  Laterality: N/A;   Family History:  Family History  Problem Relation Age of Onset   Drug abuse Mother    Suicidality Mother    Family Psychiatric  History:  Social History:  Social History   Substance and Sexual Activity  Alcohol Use Yes     Social History   Substance and Sexual Activity  Drug Use Yes   Types: Cocaine    Social History   Socioeconomic History   Marital status: Single    Spouse name: Not on file   Number of children: 4   Years of education: Not on file   Highest education level: Not on file  Occupational History   Occupation: N/A  Tobacco Use   Smoking status: Some Days    Years: 30.00    Types: Cigarettes   Smokeless tobacco: Never  Vaping Use   Vaping Use: Never used  Substance  and Sexual Activity   Alcohol use: Yes   Drug use: Yes    Types: Cocaine   Sexual activity: Not Currently    Partners: Female    Birth control/protection: None  Other Topics Concern   Not on file  Social History Narrative   ** Merged History Encounter **       Social Determinants of Health   Financial Resource Strain: Not on file  Food Insecurity: Food Insecurity Present (11/24/2022)   Hunger Vital Sign    Worried About Running Out of Food in the Last Year: Often true    Ran Out of Food in the Last Year: Often true  Transportation Needs: Unmet Transportation Needs (11/25/2022)   PRAPARE - Administrator, Civil Service (Medical): No    Lack of Transportation (Non-Medical): Yes  Physical Activity: Not on file  Stress: Not on file  Social Connections: Not on file   Additional Social History:    Allergies:   Allergies  Allergen Reactions   Motrin [Ibuprofen] Anaphylaxis   Tylenol [Acetaminophen] Hives, Nausea And Vomiting and Rash   Lyrica Cr [Pregabalin Er] Other (See Comments)    Dystonia Dysarthria  Peanut-Containing Drug Products Other (See Comments)    Pt denies allergy to peanut butter 03/02/2021    Labs:  Results for orders placed or performed during the hospital encounter of 11/23/22 (from the past 48 hour(s))  CBG monitoring, ED     Status: Abnormal   Collection Time: 11/24/22 12:28 PM  Result Value Ref Range   Glucose-Capillary 177 (H) 70 - 99 mg/dL    Comment: Glucose reference range applies only to samples taken after fasting for at least 8 hours.   Comment 1 Notify RN    Comment 2 Document in Chart   CBG monitoring, ED     Status: Abnormal   Collection Time: 11/24/22  5:15 PM  Result Value Ref Range   Glucose-Capillary 193 (H) 70 - 99 mg/dL    Comment: Glucose reference range applies only to samples taken after fasting for at least 8 hours.   Comment 1 Notify RN    Comment 2 Document in Chart   Glucose, capillary     Status: Abnormal    Collection Time: 11/24/22  9:02 PM  Result Value Ref Range   Glucose-Capillary 141 (H) 70 - 99 mg/dL    Comment: Glucose reference range applies only to samples taken after fasting for at least 8 hours.  CBC     Status: Abnormal   Collection Time: 11/25/22  6:06 AM  Result Value Ref Range   WBC 10.7 (H) 4.0 - 10.5 K/uL   RBC 3.63 (L) 4.22 - 5.81 MIL/uL   Hemoglobin 11.0 (L) 13.0 - 17.0 g/dL   HCT 34.4 (L) 39.0 - 52.0 %   MCV 94.8 80.0 - 100.0 fL   MCH 30.3 26.0 - 34.0 pg   MCHC 32.0 30.0 - 36.0 g/dL   RDW 16.4 (H) 11.5 - 15.5 %   Platelets 270 150 - 400 K/uL   nRBC 0.0 0.0 - 0.2 %    Comment: Performed at [facility], [place] [address] [place], [place] [address]  Comprehensive metabolic panel     Status: Abnormal   Collection Time: 11/25/22  6:06 AM  Result Value Ref Range   Sodium 135 135 - 145 mmol/L   Potassium 4.3 3.5 - 5.1 mmol/L   Chloride 104 98 - 111 mmol/L   CO2 23 22 - 32 mmol/L   Glucose, Bld 125 (H) 70 - 99 mg/dL    Comment: Glucose reference range applies only to samples taken after fasting for at least 8 hours.   BUN 17 6 - 20 mg/dL   Creatinine, Ser 0.88 0.61 - 1.24 mg/dL   Calcium 8.4 (L) 8.9 - 10.3 mg/dL   Total Protein 7.1 6.5 - 8.1 g/dL   Albumin 2.9 (L) 3.5 - 5.0 g/dL   AST 13 (L) 15 - 41 U/L   ALT 10 0 - 44 U/L   Alkaline Phosphatase 65 38 - 126 U/L   Total Bilirubin 0.6 0.3 - 1.2 mg/dL   GFR, Estimated >60 >60 mL/min    Comment: (NOTE) Calculated using the CKD-EPI Creatinine Equation (2021)    Anion gap 8 5 - 15    Comment: Performed at [facility], [place] [address], [place], [place] [address]  Glucose, capillary     Status: Abnormal   Collection Time: 11/25/22  7:52 AM  Result Value Ref Range   Glucose-Capillary 120 (H) 70 - 99 mg/dL    Comment: Glucose reference range applies only to samples taken after fasting for at least 8  hours.  Glucose, capillary     Status: None   Collection Time: 11/25/22 11:29 AM   Result Value Ref Range   Glucose-Capillary 91 70 - 99 mg/dL    Comment: Glucose reference range applies only to samples taken after fasting for at least 8 hours.  Glucose, capillary     Status: Abnormal   Collection Time: 11/25/22  3:56 PM  Result Value Ref Range   Glucose-Capillary 112 (H) 70 - 99 mg/dL    Comment: Glucose reference range applies only to samples taken after fasting for at least 8 hours.  Glucose, capillary     Status: Abnormal   Collection Time: 11/25/22  8:45 PM  Result Value Ref Range   Glucose-Capillary 110 (H) 70 - 99 mg/dL    Comment: Glucose reference range applies only to samples taken after fasting for at least 8 hours.  Glucose, capillary     Status: None   Collection Time: 11/26/22  7:08 AM  Result Value Ref Range   Glucose-Capillary 88 70 - 99 mg/dL    Comment: Glucose reference range applies only to samples taken after fasting for at least 8 hours.  Basic metabolic panel     Status: Abnormal   Collection Time: 11/26/22  7:52 AM  Result Value Ref Range   Sodium 138 135 - 145 mmol/L   Potassium 4.4 3.5 - 5.1 mmol/L   Chloride 107 98 - 111 mmol/L   CO2 22 22 - 32 mmol/L   Glucose, Bld 105 (H) 70 - 99 mg/dL    Comment: Glucose reference range applies only to samples taken after fasting for at least 8 hours.   BUN 20 6 - 20 mg/dL   Creatinine, Ser 0.98 0.61 - 1.24 mg/dL   Calcium 8.4 (L) 8.9 - 10.3 mg/dL   GFR, Estimated >60 >60 mL/min    Comment: (NOTE) Calculated using the CKD-EPI Creatinine Equation (2021)    Anion gap 9 5 - 15    Comment: Performed at [facility], [place] [address], [place], [place] [address]  Glucose, capillary     Status: None   Collection Time: 11/26/22 11:19 AM  Result Value Ref Range   Glucose-Capillary 90 70 - 99 mg/dL    Comment: Glucose reference range applies only to samples taken after fasting for at least 8 hours.    Current Facility-Administered Medications  Medication Dose Route Frequency  Provider Last Rate Last Admin   albuterol (PROVENTIL) (2.5 MG/3ML) 0.083% nebulizer solution 2.5 mg  2.5 mg Nebulization Q4H PRN Reubin Milan, MD       amLODipine (NORVASC) tablet 10 mg  10 mg Oral Daily Reubin Milan, MD   10 mg at 11/26/22 [id number]   apixaban (ELIQUIS) tablet 5 mg  5 mg Oral BID Debbe Odea, MD   5 mg at 11/26/22 0936   ARIPiprazole (ABILIFY) tablet 10 mg  10 mg Oral Daily Debbe Odea, MD   10 mg at 11/26/22 0934   budesonide (PULMICORT) nebulizer solution 1 mg  1 mg Nebulization BID Reubin Milan, MD   1 mg at 11/26/22 0913   escitalopram (LEXAPRO) tablet 5 mg  5 mg Oral Daily Debbe Odea, MD   5 mg at [id number]   folic acid (FOLVITE) tablet 1 mg  1 mg Oral Daily Reubin Milan, MD       gabapentin (NEURONTIN) capsule 600 mg  600 mg Oral TID Reubin Milan, MD   600 mg at 11/26/22  0935   guaiFENesin-dextromethorphan (ROBITUSSIN DM) 100-10 MG/5ML syrup 10 mL  10 mL Oral Q4H PRN Reubin Milan, MD   10 mL at 11/26/22 0547   hydrocerin (EUCERIN) cream   Topical Daily Reubin Milan, MD   Given at 11/26/22 [id number]   insulin aspart (novoLOG) injection 0-15 Units  0-15 Units Subcutaneous TID WC Reubin Milan, MD       ipratropium-albuterol (DUONEB) 0.5-2.5 (3) MG/3ML nebulizer solution 3 mL  3 mL Nebulization BID Debbe Odea, MD   3 mL at 11/26/22 0913   lisinopril (ZESTRIL) tablet 10 mg  10 mg Oral Daily Reubin Milan, MD   10 mg at 11/26/22 [id number]   LORazepam (ATIVAN) tablet 1-4 mg  1-4 mg Oral Q1H PRN Reubin Milan, MD       Or   LORazepam (ATIVAN) injection 1-4 mg  1-4 mg Intravenous Q1H PRN Reubin Milan, MD       LORazepam (ATIVAN) tablet 0-4 mg  0-4 mg Oral Q12H Reubin Milan, MD       multivitamin with minerals tablet 1 tablet  1 tablet Oral Daily Reubin Milan, MD       oxyCODONE (Oxy IR/ROXICODONE) immediate release tablet 5 mg  5 mg Oral Q6H PRN Reubin Milan, MD   5 mg at 11/26/22 [id number]    prochlorperazine (COMPAZINE) injection 10 mg  10 mg Intravenous Q6H PRN Reubin Milan, MD       thiamine (VITAMIN B1) tablet 100 mg  100 mg Oral Daily Reubin Milan, MD       Or   thiamine (VITAMIN B1) injection 100 mg  100 mg Intravenous Daily Reubin Milan, MD       traZODone (DESYREL) tablet 50 mg  50 mg Oral QHS PRN Reubin Milan, MD        Musculoskeletal:   Psychiatric Specialty Exam:  Presentation  General Appearance:  Appropriate for Environment  Eye Contact: Fair  Speech: Clear and Coherent  Speech Volume: Normal  Handedness: Right   Mood and Affect  Mood: Irritable  Affect: Congruent   Thought Process  Thought Processes: Coherent  Descriptions of Associations:Intact  Orientation:Full (Time, Place and Person)  Thought Content:Logical  History of Schizophrenia/Schizoaffective disorder:Yes  Duration of Psychotic Symptoms:Greater than six months  Hallucinations:No data recorded Ideas of Reference:None  Suicidal Thoughts:No data recorded Homicidal Thoughts:No data recorded  Sensorium  Memory: Immediate Fair; Recent Fair  Judgment: Fair  Insight: Fair   Community education officer  Concentration: Fair  Attention Span: Fair  Recall: Good  Fund of Knowledge: Good  Language: Good   Psychomotor Activity  Psychomotor Activity:No data recorded  Assets  Assets: Desire for Improvement; Leisure Time; Physical Health; Resilience   Sleep  Sleep:No data recorded  Physical Exam: Physical Exam Vitals and nursing note reviewed.  Neurological:     Mental Status: He is alert.  Psychiatric:        Mood and Affect: Mood normal.        Thought Content: Thought content normal.    Review of Systems  Psychiatric/Behavioral:  Positive for depression and suicidal ideas. The patient is nervous/anxious.   All other systems reviewed and are negative.  Blood pressure (!) 133/97, pulse 100, temperature 98.1 F (36.7  C), temperature source Oral, resp. rate 18, height 5\' 9"  (1.753 m), weight 83.4 kg, SpO2 98 %. Body mass index is 27.14 kg/m.  Treatment Plan Summary: Daily contact with patient to assess and  evaluate symptoms and progress in treatment and Medication management  - Psychiatry has placed a consult for  Transition of care (TOC) additional outpatient resources  Continue Abilify 10 mg daily.   Continue Lexapro 5 mg daily Continue gabapentin 400 mg p.o. 3 times daily  Psychiatry signing off  Disposition: No evidence of imminent risk to self or others at present.   Patient does not meet criteria for psychiatric inpatient admission. Supportive therapy provided about ongoing stressors. Refer to IOP. Discussed crisis plan, support from social network, calling 911, coming to the Emergency Department, and calling Suicide Hotline.  Derrill Center, NP 11/26/2022 12:14 PM

## 2022-11-26 NOTE — Progress Notes (Signed)
  Progress Note   Patient: [name] HKU:[id number] DOB: October 15, 1966 DOA: 11/23/2022     1 DOS: the patient was seen and examined on 11/26/2022   Brief hospital course: 56 y.o. male with medical history significant of asthma/COPD, bipolar 1 disorder, depression, schizoaffective disorder, homelessness, GERD, hypertension, QT prolongation, type 2 diabetes, alcohol abuse, cocaine and cannabis abuse, tobacco use, chronic combined systolic and diastolic heart failure, right AKA who came into the emergency department due to feeling suicidal since 3 days ago, left lower extremity edema and worsening wounds.  He has not taking his medications since earlier this month.  He has been drinking EtOH, smoking cannabis and using cocaine. He stated that he planned to commit suicide and was admitted to the hospital for a COPD exacerbation.    Assessment and Plan: Principal Problem:   COPD with acute exacerbation - decreased BS on exam with end-expiratory wheezing. Coughing and wheezing could be heard in hallway -Cont neb tx. Will schedule prednisone   Active Problems: Suicidal intentions - continued sitter -Seen by Psychiatry, as of 12/27, no evidence of imminent risk to self or others, psychiatry signed off    Open wounds involving multiple regions of lower extremity Right BKA - will continue wound care - arterial doppler is normal - Seen by Podiatry 12/26 who recommends outpatient wound care f/u, no plan for surgery, recs to continue augmentin   H/o chronic PE - refused Lovenox- resume Elquis     Schizoaffective disorder, bipolar type (HCC) - cont Abilify, Lexapro and Trazodone     Tobacco use disorder, Cocaine use, ETOH abuse - counseled     Homeless- TOC following        Subjective: Still with cough and sob  Physical Exam: Vitals:   11/26/22 0602 11/26/22 0830 11/26/22 0913 11/26/22 1417  BP: (!) 133/97   117/75  Pulse: 100   (!) 101  Resp:    18  Temp: 98.1 F (36.7 C)   98 F  (36.7 C)  TempSrc: Oral   Oral  SpO2: 99%  98% 98%  Weight:  83.4 kg    Height:       General exam: Awake, laying in bed, in nad Respiratory system: Normal respiratory effort, no wheezing Cardiovascular system: regular rate, s1, s2 Gastrointestinal system: Soft, nondistended, positive BS Central nervous system: CN2-12 grossly intact, strength intact Extremities: Perfused, no clubbing Skin: Normal skin turgor, no notable skin lesions seen Psychiatry: Mood normal // no visual hallucinations   Data Reviewed:  Labs reviewed: Na 138, K 4.4, Cr 0.98   Family Communication: Pt in room, family not at bedside  Disposition: Status is: Inpatient Remains inpatient appropriate because: Severity of illness  Planned Discharge Destination: Home    Author: Rickey Barbara, MD 11/26/2022 3:17 PM  For on call review [email or web address].

## 2022-11-27 DIAGNOSIS — J441 Chronic obstructive pulmonary disease with (acute) exacerbation: Secondary | ICD-10-CM | POA: Diagnosis not present

## 2022-11-27 DIAGNOSIS — L03116 Cellulitis of left lower limb: Secondary | ICD-10-CM | POA: Diagnosis not present

## 2022-11-27 LAB — COMPREHENSIVE METABOLIC PANEL
ALT: 11 U/L (ref 0–44)
AST: 12 U/L — ABNORMAL LOW (ref 15–41)
Albumin: 2.8 g/dL — ABNORMAL LOW (ref 3.5–5.0)
Alkaline Phosphatase: 61 U/L (ref 38–126)
Anion gap: 8 (ref 5–15)
BUN: 18 mg/dL (ref 6–20)
CO2: 24 mmol/L (ref 22–32)
Calcium: 8.5 mg/dL — ABNORMAL LOW (ref 8.9–10.3)
Chloride: 104 mmol/L (ref 98–111)
Creatinine, Ser: 0.97 mg/dL (ref 0.61–1.24)
GFR, Estimated: >60 mL/min (ref >60)
Glucose, Bld: 121 mg/dL — ABNORMAL HIGH (ref 70–99)
Potassium: 4.2 mmol/L (ref 3.5–5.1)
Sodium: 136 mmol/L (ref 135–145)
Total Bilirubin: 0.5 mg/dL (ref 0.3–1.2)
Total Protein: 7.3 g/dL (ref 6.5–8.1)

## 2022-11-27 LAB — CBC
HCT: 38.9 % — ABNORMAL LOW (ref 39.0–52.0)
Hemoglobin: 12.6 g/dL — ABNORMAL LOW (ref 13.0–17.0)
MCH: 30.3 pg (ref 26.0–34.0)
MCHC: 32.4 g/dL (ref 30.0–36.0)
MCV: 93.5 fL (ref 80.0–100.0)
Platelets: 375 10*3/uL (ref 150–400)
RBC: 4.16 MIL/uL — ABNORMAL LOW (ref 4.22–5.81)
RDW: 15.9 % — ABNORMAL HIGH (ref 11.5–15.5)
WBC: 8.3 10*3/uL (ref 4.0–10.5)
nRBC: 0 % (ref 0.0–0.2)

## 2022-11-27 LAB — GLUCOSE, CAPILLARY
Glucose-Capillary: 108 mg/dL — ABNORMAL HIGH (ref 70–99)
Glucose-Capillary: 121 mg/dL — ABNORMAL HIGH (ref 70–99)
Glucose-Capillary: 226 mg/dL — ABNORMAL HIGH (ref 70–99)
Glucose-Capillary: 76 mg/dL (ref 70–99)

## 2022-11-27 MED ORDER — OXYCODONE HCL 5 MG PO TABS
5.0000 mg | ORAL_TABLET | ORAL | Status: DC | PRN
  Administered 2022-11-27: 5 mg via ORAL
  Administered 2022-11-27 – 2022-12-03 (×21): 10 mg via ORAL
  Filled 2022-11-27: qty 2
  Filled 2022-11-27: qty 1
  Filled 2022-11-27 (×20): qty 2

## 2022-11-27 NOTE — Progress Notes (Signed)
  Progress Note   Patient: [name] KGU:[id number] DOB: Nov 21, 1966 DOA: 11/23/2022     2 DOS: the patient was seen and examined on 11/27/2022   Brief hospital course: 56 y.o. male with medical history significant of asthma/COPD, bipolar 1 disorder, depression, schizoaffective disorder, homelessness, GERD, hypertension, QT prolongation, type 2 diabetes, alcohol abuse, cocaine and cannabis abuse, tobacco use, chronic combined systolic and diastolic heart failure, right AKA who came into the emergency department due to feeling suicidal since 3 days ago, left lower extremity edema and worsening wounds.  He has not taking his medications since earlier this month.  He has been drinking EtOH, smoking cannabis and using cocaine. He stated that he planned to commit suicide and was admitted to the hospital for a COPD exacerbation.    Assessment and Plan: Principal Problem:   COPD with acute exacerbation -now on steroids and neb tx - decreased BS on exam, seems more clear today -Pt has been taking ACEI chronically. Will hold for now given possible cough side effect   Active Problems: Suicidal intentions - had been continued with sitter -Seen by Psychiatry, as of 12/27, no evidence of imminent risk to self or others, psychiatry signed off    Open wounds involving multiple regions of lower extremity Right BKA - will continue wound care - arterial doppler is normal - Seen by Podiatry 12/26 who recommends outpatient wound care f/u, no plan for surgery, recs to continue augmentin - Seen by wound care, recs noted - Pt reports increased pain from foot. Increased oxycodone regimen    H/o chronic PE - refused Lovenox- resume Elquis     Schizoaffective disorder, bipolar type (HCC) - cont Abilify, Lexapro and Trazodone     Tobacco use disorder, Cocaine use, ETOH abuse - counseled     Homeless- TOC following        Subjective: Asking for regular diet and increased pain meds  Physical  Exam: Vitals:   11/26/22 1930 11/27/22 0616 11/27/22 0753 11/27/22 1502  BP: 126/84 (!) 136/96  112/74  Pulse: 90 96  87  Resp: 20 20  18   Temp: 98.4 F (36.9 C) 98.2 F (36.8 C)  98 F (36.7 C)  TempSrc: Oral   Oral  SpO2: 92% 99% 98% 95%  Weight:      Height:       General exam: Conversant, in no acute distress Respiratory system: normal chest rise, clear, no audible wheezing Cardiovascular system: regular rhythm, s1-s2 Gastrointestinal system: Nondistended, nontender, pos BS Central nervous system: No seizures, no tremors Extremities: No cyanosis, L foot with wrap in place Skin: No rashes, no pallor Psychiatry: Affect normal // no auditory hallucinations   Data Reviewed:  Labs reviewed: Na 136, K 4.2, Cr 0.97   Family Communication: Pt in room, family not at bedside  Disposition: Status is: Inpatient Remains inpatient appropriate because: Severity of illness  Planned Discharge Destination: Home    Author: , MD 11/27/2022 4:54 PM  For on call review [email or web address].

## 2022-11-27 NOTE — Evaluation (Signed)
Physical Therapy Evaluation Patient Details Name: [name] MRN: [id number] DOB: 01/09/1966 Today's Date: 11/27/2022  History of Present Illness  Pt is a 56 y.o. male presenting to [facility] ED on 12/25 with suicidal ideation, LLE edema and wounds. Imaging showing small PE(s). PMH: stroke with residual dysarthria/right sided weakness (wheelchair bound), HFrEF (EF <20%), CAD, T2DM (diet controlled), cocaine abuse, schizoaffective disorder, anemia, COPD, depression, GERD, HTN, Bipolar disorder, smoker, hallux amputation bilaterally, unhoused, R-AKA.    Clinical Impression  Juanangel Soderholm is 56 y.o. male admitted with above HPI and diagnosis. Patient is currently limited by functional impairments below (see PT problem list). Patient struggling with homelessness and has been mod independent with wheelchair for mobility at baseline but reports limited due to Lt foot/let pain. Pt abel to sit up in bed and scoot in bed with supervision and use of Lt UE predominately. Session limited by severe pain. Patient will benefit from continued skilled PT interventions to address impairments and progress independence with mobility. Pt likely will need to rehab in hospital to safe level to discharge back to community with assist from [facility] to obtain community assistance through local resources. Acute PT will follow and progress as able.        Recommendations for follow up therapy are one component of a multi-disciplinary discharge planning process, led by the attending physician.  Recommendations may be updated based on patient status, additional functional criteria and insurance authorization.  Follow Up Recommendations Other (comment) (TOC handling placement: referred him to a community team that can at least help him with MH/SA/housing/food/transport)      Assistance Recommended at Discharge Frequent or constant Supervision/Assistance  Patient can return home with the following  A lot of help with walking and/or  transfers;A little help with bathing/dressing/bathroom;Assistance with cooking/housework;Direct supervision/assist for medications management;Assist for transportation;Help with stairs or ramp for entrance;Direct supervision/assist for financial management    Equipment Recommendations  (pt reports EMS brough WC to hospital and it is here somewhere)  Recommendations for Other Services       Functional Status Assessment Patient has had a recent decline in their functional status and demonstrates the ability to make significant improvements in function in a reasonable and predictable amount of time.     Precautions / Restrictions Precautions Precautions: Fall Precaution Comments: Rt BKA, Lt foot pain/wounds Restrictions Weight Bearing Restrictions: No      Mobility  Bed Mobility Overal bed mobility: Needs Assistance Bed Mobility: Supine to Sit, Sit to Supine     Supine to sit: Supervision, HOB elevated Sit to supine: Supervision        Transfers                   General transfer comment: deffered OOB to recliner due to pain    Ambulation/Gait                  Stairs            Wheelchair Mobility    Modified Rankin (Stroke Patients Only)       Balance Overall balance assessment: Needs assistance Sitting-balance support: Bilateral upper extremity supported Sitting balance-Leahy Scale: Fair                                       Pertinent Vitals/Pain Pain Assessment Pain Assessment: Faces Faces Pain Scale: Hurts whole lot Pain Location: Lt LE from knee down Pain  Descriptors / Indicators: Grimacing, Guarding, Burning Pain Intervention(s): Limited activity within patient's tolerance, Monitored during session, Repositioned    Home Living Family/patient expects to be discharged to:: Shelter/Homeless                   Additional Comments: Pt generally struggles to enunciate but able to be understood.    Prior  Function Prior Level of Function : Patient poor historian/Family not available;Independent/Modified Independent             Mobility Comments: Pt confirms info from prior admission: Pt uses WC at baseline but reports having difficulty with propulsion as he cannot use his feet secondary to pain/wounds and using his right UE is becoming more difficult. Pt expresses frustration that he must ask people for assistance for propulsion. reports he is waiting on prosthesis for Rt limb but due to Lt LE pain/wounds cannot progress to stand and gait. ADLs Comments: pt reporst he was homeless and living on the streets in his w/c. He is unable to state how he completed ADLs or how he got food.     Hand Dominance   Dominant Hand: Right    Extremity/Trunk Assessment   Upper Extremity Assessment Upper Extremity Assessment: Defer to OT evaluation;RUE deficits/detail;LUE deficits/detail RUE Deficits / Details: limtied by prior CVA, flexion 4/5 at elbow/wrsit; extension limited to 3/5 at wrsit/elbow and gros grip weaker than Lt. LUE Deficits / Details: grossly 4/5 or better    Lower Extremity Assessment Lower Extremity Assessment: LLE deficits/detail;RLE deficits/detail RLE Deficits / Details: Rt limb intact, no visible wounds. LLE Deficits / Details: Lt foot in splint/wrap, limited testing due to pain LLE: Unable to fully assess due to pain    Cervical / Trunk Assessment Cervical / Trunk Assessment: Normal  Communication   Communication: Expressive difficulties (mumbles)  Cognition Arousal/Alertness: Awake/alert Behavior During Therapy: WFL for tasks assessed/performed Overall Cognitive Status: Within Functional Limits for tasks assessed                                 General Comments: overall answers questions appropriately,        General Comments      Exercises     Assessment/Plan    PT Assessment Patient needs continued PT services  PT Problem List Decreased  strength;Decreased range of motion;Decreased activity tolerance;Decreased balance;Decreased mobility;Decreased cognition;Decreased knowledge of use of DME;Decreased safety awareness;Decreased knowledge of precautions;Impaired sensation;Pain;Decreased skin integrity       PT Treatment Interventions DME instruction;Gait training;Stair training;Functional mobility training;Therapeutic activities;Therapeutic exercise;Balance training;Neuromuscular re-education;Patient/family education;Wheelchair mobility training    PT Goals (Current goals can be found in the Care Plan section)  Acute Rehab PT Goals Patient Stated Goal: Lt foot to stop hurting PT Goal Formulation: With patient Time For Goal Achievement: 12/11/22 Potential to Achieve Goals: Good    Frequency Min 3X/week     Co-evaluation               AM-PAC PT "6 Clicks" Mobility  Outcome Measure Help needed turning from your back to your side while in a flat bed without using bedrails?: None Help needed moving from lying on your back to sitting on the side of a flat bed without using bedrails?: None Help needed moving to and from a bed to a chair (including a wheelchair)?: A Little Help needed standing up from a chair using your arms (e.g., wheelchair or bedside chair)?: Total Help needed to  walk in hospital room?: Total Help needed climbing 3-5 steps with a railing? : Total 6 Click Score: 14    End of Session   Activity Tolerance: Patient limited by pain Patient left: in bed;with call bell/phone within reach;with nursing/sitter in room Nurse Communication: Mobility status PT Visit Diagnosis: Muscle weakness (generalized) (M62.81);Difficulty in walking, not elsewhere classified (R26.2);Other abnormalities of gait and mobility (R26.89)    Time: 1001-1012 PT Time Calculation (min) (ACUTE ONLY): 11 min   Charges:   PT Evaluation $PT Eval Moderate Complexity: 1 Mod          Wynn Maudlin, DPT Acute Rehabilitation  Services Office [phone]  11/27/22 12:56 PM

## 2022-11-28 DIAGNOSIS — L03116 Cellulitis of left lower limb: Secondary | ICD-10-CM | POA: Diagnosis not present

## 2022-11-28 DIAGNOSIS — J441 Chronic obstructive pulmonary disease with (acute) exacerbation: Secondary | ICD-10-CM | POA: Diagnosis not present

## 2022-11-28 LAB — GLUCOSE, CAPILLARY
Glucose-Capillary: 99 mg/dL (ref 70–99)
Glucose-Capillary: 134 mg/dL — ABNORMAL HIGH (ref 70–99)
Glucose-Capillary: 139 mg/dL — ABNORMAL HIGH (ref 70–99)
Glucose-Capillary: 139 mg/dL — ABNORMAL HIGH (ref 70–99)

## 2022-11-28 NOTE — Progress Notes (Signed)
Pt refused 2nd B/P after high reading. Also, refused dressing change.

## 2022-11-28 NOTE — Progress Notes (Signed)
  Progress Note   Patient: [name] [id number]:[id number] DOB: 12-23-1965 DOA: 11/23/2022     3 DOS: the patient was seen and examined on 11/28/2022   Brief hospital course: 56 y.o. male with medical history significant of asthma/COPD, bipolar 1 disorder, depression, schizoaffective disorder, homelessness, GERD, hypertension, QT prolongation, type 2 diabetes, alcohol abuse, cocaine and cannabis abuse, tobacco use, chronic combined systolic and diastolic heart failure, right AKA who came into the emergency department due to feeling suicidal since 3 days ago, left lower extremity edema and worsening wounds.  He has not taking his medications since earlier this month.  He has been drinking EtOH, smoking cannabis and using cocaine. He stated that he planned to commit suicide and was admitted to the hospital for a COPD exacerbation.    Assessment and Plan: Principal Problem:   COPD with acute exacerbation -now on steroids and neb tx - decreased BS on exam, seems more clear today -Pt has been taking ACEI chronically. Holding given possible cough side effect -This AM cough seems improved. Still with end-espiratory wheezing on exam   Active Problems: Suicidal intentions - had been continued with sitter -Seen by Psychiatry, as of 12/27, no evidence of imminent risk to self or others, psychiatry signed off    Open wounds involving multiple regions of lower extremity Right BKA - will continue wound care - arterial doppler is normal - Seen by Podiatry 12/26 who recommends outpatient wound care f/u, no plan for surgery, recs to continue augmentin - Seen by wound care, recs noted - Pt reports increased pain from foot. Increased oxycodone regimen with improvement in pain   H/o chronic PE - refused Lovenox- resumed Elquis     Schizoaffective disorder, bipolar type (HCC) - cont Abilify, Lexapro and Trazodone     Tobacco use disorder, Cocaine use, ETOH abuse - counseled     Homeless- TOC  following        Subjective: States cough seems better today  Physical Exam: Vitals:   11/28/22 0453 11/28/22 0502 11/28/22 0844 11/28/22 1326  BP:  (!) 157/114  (!) 134/93  Pulse:  99  94  Resp:  20  18  Temp:  98.1 F (36.7 C)  97.7 F (36.5 C)  TempSrc:  Oral  Oral  SpO2:  100% 99% 100%  Weight: 83.4 kg     Height:       General exam: Awake, laying in bed, in nad Respiratory system: Normal respiratory effort, end-expiratory wheezing Cardiovascular system: regular rate, s1, s2 Gastrointestinal system: Soft, nondistended, positive BS Central nervous system: CN2-12 grossly intact, strength intact Extremities: Perfused, no clubbing Skin: Normal skin turgor, no notable skin lesions seen Psychiatry: Mood normal // no visual hallucinations   Data Reviewed:  There are no new results to review at this time.  Family Communication: Pt in room, family not at bedside  Disposition: Status is: Inpatient Remains inpatient appropriate because: Severity of illness  Planned Discharge Destination: Home    Author: Rickey Barbara, MD 11/28/2022 3:38 PM  For on call review [email or web address].

## 2022-11-29 DIAGNOSIS — J441 Chronic obstructive pulmonary disease with (acute) exacerbation: Secondary | ICD-10-CM | POA: Diagnosis not present

## 2022-11-29 DIAGNOSIS — L03116 Cellulitis of left lower limb: Secondary | ICD-10-CM | POA: Diagnosis not present

## 2022-11-29 LAB — CULTURE, BLOOD (ROUTINE X 2): Culture: NO GROWTH

## 2022-11-29 LAB — GLUCOSE, CAPILLARY
Glucose-Capillary: 76 mg/dL (ref 70–99)
Glucose-Capillary: 93 mg/dL (ref 70–99)
Glucose-Capillary: 165 mg/dL — ABNORMAL HIGH (ref 70–99)
Glucose-Capillary: 71 mg/dL (ref 70–99)

## 2022-11-29 MED ORDER — ISOSORBIDE MONONITRATE ER 30 MG PO TB24
15.0000 mg | ORAL_TABLET | Freq: Every day | ORAL | Status: DC
  Administered 2022-11-29 – 2022-12-03 (×5): 15 mg via ORAL
  Filled 2022-11-29 (×5): qty 1

## 2022-11-29 NOTE — Progress Notes (Signed)
  Progress Note   Patient: [name] IOE:[id number] DOB: [birth year] DOA: 11/23/2022     4 DOS: the patient was seen and examined on 11/29/2022   Brief hospital course: 56 y.o. male with medical history significant of asthma/COPD, bipolar 1 disorder, depression, schizoaffective disorder, homelessness, GERD, hypertension, QT prolongation, type 2 diabetes, alcohol abuse, cocaine and cannabis abuse, tobacco use, chronic combined systolic and diastolic heart failure, right AKA who came into the emergency department due to feeling suicidal since 3 days ago, left lower extremity edema and worsening wounds.  He has not taking his medications since earlier this month.  He has been drinking EtOH, smoking cannabis and using cocaine. He stated that he planned to commit suicide and was admitted to the hospital for a COPD exacerbation.    Assessment and Plan: Principal Problem:   COPD with acute exacerbation -now on steroids and neb tx - decreased BS on exam, seems more clear today -Pt has been taking ACEI chronically. Holding given possible cough side effect -This AM cough seems improved. Still with end-espiratory wheezing on exam   Active Problems: Suicidal intentions - had been continued with sitter -Seen by Psychiatry, as of 12/27, no evidence of imminent risk to self or others, psychiatry signed off    Open wounds involving multiple regions of lower extremity Right BKA - will continue wound care - arterial doppler is normal - Seen by Podiatry 12/26 who recommends outpatient wound care f/u, no plan for surgery, recs to continue augmentin - Seen by wound care, recs noted - Pt reports increased pain from foot. Increased oxycodone regimen with improvement in pain -Recommend pt continue with daily dressing changes as recommended by WOC. RN documentation noted of pt refusing dressing changes -As of 12/30, wound appears with granulation. Reviewed during dressing change 12/30   H/o chronic PE -  refused Lovenox- resumed Elquis     Schizoaffective disorder, bipolar type (HCC) - cont Abilify, Lexapro and Trazodone     Tobacco use disorder, Cocaine use, ETOH abuse - counseled     Homeless- TOC following        Subjective: Reports cough has improved  Physical Exam: Vitals:   11/29/22 0425 11/29/22 0437 11/29/22 0602 11/29/22 1318  BP: (!) 147/108  (!) 144/86 (!) 144/104  Pulse: 90  82 98  Resp: 20  18 18   Temp: 97.6 F (36.4 C)   97.8 F (36.6 C)  TempSrc: Oral   Oral  SpO2: 99%   95%  Weight:  79.8 kg    Height:       General exam: Conversant, in no acute distress Respiratory system: normal chest rise, clear, no audible wheezing Cardiovascular system: regular rhythm, s1-s2 Gastrointestinal system: Nondistended, nontender, pos BS Central nervous system: No seizures, no tremors Extremities: No cyanosis, no joint deformities Skin: ulcerations on either side of ankle with granulation Psychiatry: Affect normal // no auditory hallucinations   Data Reviewed:  There are no new results to review at this time.  Family Communication: Pt in room, family not at bedside  Disposition: Status is: Inpatient Remains inpatient appropriate because: Severity of illness  Planned Discharge Destination: Home    Author: , MD 11/29/2022 6:27 PM  For on call review [email or web address].

## 2022-11-30 DIAGNOSIS — J441 Chronic obstructive pulmonary disease with (acute) exacerbation: Secondary | ICD-10-CM | POA: Diagnosis not present

## 2022-11-30 DIAGNOSIS — L03116 Cellulitis of left lower limb: Secondary | ICD-10-CM | POA: Diagnosis not present

## 2022-11-30 LAB — GLUCOSE, CAPILLARY
Glucose-Capillary: 95 mg/dL (ref 70–99)
Glucose-Capillary: 133 mg/dL — ABNORMAL HIGH (ref 70–99)
Glucose-Capillary: 187 mg/dL — ABNORMAL HIGH (ref 70–99)
Glucose-Capillary: 156 mg/dL — ABNORMAL HIGH (ref 70–99)

## 2022-11-30 LAB — CULTURE, BLOOD (ROUTINE X 2)
Culture: NO GROWTH
Special Requests: ADEQUATE

## 2022-11-30 MED ORDER — LOSARTAN POTASSIUM 50 MG PO TABS
25.0000 mg | ORAL_TABLET | Freq: Every day | ORAL | Status: DC
  Administered 2022-11-30 – 2022-12-03 (×4): 25 mg via ORAL
  Filled 2022-11-30 (×4): qty 1

## 2022-11-30 NOTE — Progress Notes (Signed)
  Progress Note   Patient: [name] [id number]:[id number] DOB: 1965/12/06 DOA: 11/23/2022     5 DOS: the patient was seen and examined on 11/30/2022   Brief hospital course: 56 y.o. male with medical history significant of asthma/COPD, bipolar 1 disorder, depression, schizoaffective disorder, homelessness, GERD, hypertension, QT prolongation, type 2 diabetes, alcohol abuse, cocaine and cannabis abuse, tobacco use, chronic combined systolic and diastolic heart failure, right AKA who came into the emergency department due to feeling suicidal since 3 days ago, left lower extremity edema and worsening wounds.  He has not taking his medications since earlier this month.  He has been drinking EtOH, smoking cannabis and using cocaine. He stated that he planned to commit suicide and was admitted to the hospital for a COPD exacerbation.    Assessment and Plan: Principal Problem:   COPD with acute exacerbation -now on steroids and neb tx -Pt has been taking ACEI chronically. Held given possible cough side effect -cough now improved, essentially resolved   Active Problems: Suicidal intentions - had been continued with sitter -Seen by Psychiatry, as of 12/27, no evidence of imminent risk to self or others, psychiatry signed off    Open wounds involving multiple regions of lower extremity Right BKA - will continue wound care - arterial doppler is normal - Seen by Podiatry 12/26 who recommends outpatient wound care f/u, no plan for surgery, recs to continue augmentin - Seen by wound care, recs noted - Pt reports increased pain from foot. Increased oxycodone regimen with improvement in pain -Recommend pt continue with daily dressing changes as recommended by WOC. RN documentation noted of pt refusing dressing changes -Given LE pain, recommend pre-medicating with oxy prior to dressing change   H/o chronic PE - refused Lovenox- resumed Elquis     Schizoaffective disorder, bipolar type (HCC) - cont  Abilify, Lexapro and Trazodone     Tobacco use disorder, Cocaine use, ETOH abuse - counseled     Homeless- TOC following  HTN -BP suboptimally controlled since holding ACEI -Imdur started 12/30.  -Have added low dose cozaar  Chronic systolic CHF -known EF<20 - Pt educated extensively, pt understanding he needs to abstain from ETOH and cocaine        Subjective: States cough is much better. Asking for analgesia "in between" doses  Physical Exam: Vitals:   11/30/22 0824 11/30/22 1236 11/30/22 1313 11/30/22 1627  BP:  (!) 155/101 (!) 155/91 (!) 145/98  Pulse:  91 86 83  Resp:   18   Temp:   (!) 97.5 F (36.4 C)   TempSrc:   Oral   SpO2: 99%  97%   Weight:      Height:       General exam: Awake, laying in bed, in nad Respiratory system: Normal respiratory effort, no wheezing Cardiovascular system: regular rate, s1, s2 Gastrointestinal system: Soft, nondistended, positive BS Central nervous system: CN2-12 grossly intact, strength intact Extremities: Perfused, no clubbing, LLE with dressings in place Skin: Normal skin turgor, no notable skin lesions seen Psychiatry: Mood normal // no visual hallucinations   Data Reviewed:  There are no new results to review at this time.  Family Communication: Pt in room, family not at bedside  Disposition: Status is: Inpatient Remains inpatient appropriate because: Severity of illness  Planned Discharge Destination: Home    Author: Rickey Barbara, MD 11/30/2022 4:57 PM  For on call review [email or web address].

## 2022-12-01 DIAGNOSIS — L03116 Cellulitis of left lower limb: Secondary | ICD-10-CM | POA: Diagnosis not present

## 2022-12-01 DIAGNOSIS — J441 Chronic obstructive pulmonary disease with (acute) exacerbation: Secondary | ICD-10-CM | POA: Diagnosis not present

## 2022-12-01 LAB — GLUCOSE, CAPILLARY
Glucose-Capillary: 80 mg/dL (ref 70–99)
Glucose-Capillary: 104 mg/dL — ABNORMAL HIGH (ref 70–99)
Glucose-Capillary: 159 mg/dL — ABNORMAL HIGH (ref 70–99)
Glucose-Capillary: 184 mg/dL — ABNORMAL HIGH (ref 70–99)

## 2022-12-01 LAB — CBC
HCT: 41.5 % (ref 39.0–52.0)
Hemoglobin: 13 g/dL (ref 13.0–17.0)
MCH: 30.6 pg (ref 26.0–34.0)
MCHC: 31.3 g/dL (ref 30.0–36.0)
MCV: 97.6 fL (ref 80.0–100.0)
Platelets: 367 10*3/uL (ref 150–400)
RBC: 4.25 MIL/uL (ref 4.22–5.81)
RDW: 16.3 % — ABNORMAL HIGH (ref 11.5–15.5)
WBC: 11.7 10*3/uL — ABNORMAL HIGH (ref 4.0–10.5)
nRBC: 0 % (ref 0.0–0.2)

## 2022-12-01 LAB — COMPREHENSIVE METABOLIC PANEL
ALT: 12 U/L (ref 0–44)
AST: 12 U/L — ABNORMAL LOW (ref 15–41)
Albumin: 3 g/dL — ABNORMAL LOW (ref 3.5–5.0)
Alkaline Phosphatase: 59 U/L (ref 38–126)
Anion gap: 10 (ref 5–15)
BUN: 21 mg/dL — ABNORMAL HIGH (ref 6–20)
CO2: 23 mmol/L (ref 22–32)
Calcium: 8.3 mg/dL — ABNORMAL LOW (ref 8.9–10.3)
Chloride: 105 mmol/L (ref 98–111)
Creatinine, Ser: 1.02 mg/dL (ref 0.61–1.24)
GFR, Estimated: >60 mL/min (ref >60)
Glucose, Bld: 132 mg/dL — ABNORMAL HIGH (ref 70–99)
Potassium: 3.5 mmol/L (ref 3.5–5.1)
Sodium: 138 mmol/L (ref 135–145)
Total Bilirubin: 0.4 mg/dL (ref 0.3–1.2)
Total Protein: 6.9 g/dL (ref 6.5–8.1)

## 2022-12-01 NOTE — Progress Notes (Signed)
Physical Therapy Treatment Patient Details Name: [name] MRN: [id number] DOB: 31-Oct-1966 Today's Date: 12/01/2022   History of Present Illness Pt is a 57 y.o. male presenting to [facility] ED on 12/25 with suicidal ideation, LLE edema and wounds. Imaging showing small PE(s). PMH: stroke with residual dysarthria/right sided weakness (wheelchair bound), HFrEF (EF <20%), CAD, T2DM (diet controlled), cocaine abuse, schizoaffective disorder, anemia, COPD, depression, GERD, HTN, Bipolar disorder, smoker, hallux amputation bilaterally, unhoused, R-BKA (very high BKA, but has knee joint).    PT Comments    Pt able to transfer from bed to/from w/c at mod I level via squat pivot.  Pt with safe use of brakes.  Also, mobilized in w/c 400+ feet and maneuvered w/c in room.  Pt presents at his current baseline and has no further therapy needs at this time.  Pt unable to progress to standing or ambulation due to wounds on L foot and no R prosthetic. If pt obtains prosthetic or wounds improve on L LE could pursue further therapy. Pt asking about prosthetic - discussed mostly handled on outpatient basis via referral by his surgeon.  Pt stating has not followed up with surgeon (per chart seems surgery occurred in [place]) and doesn't have PCP.  Notified TOC and MD - pt may need MD referral if wanting to pursue prosthetic.    Recommendations for follow up therapy are one component of a multi-disciplinary discharge planning process, led by the attending physician.  Recommendations may be updated based on patient status, additional functional criteria and insurance authorization.  Follow Up Recommendations  Other (comment) (Pt homeless, but has no PT needs until prosthetic obtained)     Assistance Recommended at Discharge    Patient can return home with the following Assistance with cooking/housework;Help with stairs or ramp for entrance   Equipment Recommendations  None recommended by PT    Recommendations for  Other Services       Precautions / Restrictions Precautions Precautions: Fall Precaution Comments: Rt BKA, Lt foot pain/wounds     Mobility  Bed Mobility Overal bed mobility: Independent                  Transfers Overall transfer level: Needs assistance   Transfers: Bed to chair/wheelchair/BSC       Squat pivot transfers: Modified independent (Device/Increase time)     General transfer comment: Pt was able to move w/c to EOB, lock brakes, and squat pivot to chair without assist.  He also was able to return to bed in similar manner.  Required no cues.    Ambulation/Gait                   Stairs             Wheelchair Mobility    Modified Rankin (Stroke Patients Only)       Balance Overall balance assessment: Needs assistance Sitting-balance support: No upper extremity supported Sitting balance-Leahy Scale: Normal         Standing balance comment: unable due to L foot wounds and BKA no prosthetic on R                            Cognition Arousal/Alertness: Awake/alert Behavior During Therapy: WFL for tasks assessed/performed Overall Cognitive Status: Within Functional Limits for tasks assessed  Exercises      General Comments        Pertinent Vitals/Pain Pain Assessment Pain Assessment: Faces Faces Pain Scale: Hurts a little bit Pain Location: Lt LE from knee down Pain Intervention(s): Limited activity within patient's tolerance, Monitored during session, Premedicated before session    Home Living                          Prior Function            PT Goals (current goals can now be found in the care plan section) Progress towards PT goals: Goals met/education completed, patient discharged from PT    Frequency           PT Plan Current plan remains appropriate    Co-evaluation              AM-PAC PT "6 Clicks" Mobility    Outcome Measure  Help needed turning from your back to your side while in a flat bed without using bedrails?: None Help needed moving from lying on your back to sitting on the side of a flat bed without using bedrails?: None Help needed moving to and from a bed to a chair (including a wheelchair)?: None Help needed standing up from a chair using your arms (e.g., wheelchair or bedside chair)?: Total Help needed to walk in hospital room?: Total Help needed climbing 3-5 steps with a railing? : Total 6 Click Score: 15    End of Session   Activity Tolerance: Patient tolerated treatment well Patient left: in bed;with call bell/phone within reach;with nursing/sitter in room Nurse Communication: Mobility status PT Visit Diagnosis: Muscle weakness (generalized) (M62.81);Difficulty in walking, not elsewhere classified (R26.2);Other abnormalities of gait and mobility (R26.89)     Time: 1353-1415 PT Time Calculation (min) (ACUTE ONLY): 22 min  Charges:  $Therapeutic Activity: 8-22 mins                     Abran Richard, PT Acute Rehab [facility] [phone]    Karlton Lemon 12/01/2022, 2:34 PM

## 2022-12-01 NOTE — TOC Progression Note (Addendum)
Transition of Care [facility]) - Progression Note    Patient Details  Name: [name] MRN: [id number] Date of Birth: [birth year]  Transition of Care [facility]) CM/SW [place], RN Phone Number:[phone]  12/01/2022, 3:27 PM  Clinical Narrative:    TOC / CM at bedside per MD request to discuss disposition planning. CM at bedside patient is requesting information about obtaining a prosthesis. CM has explained to patient that TOC typically does not handle referrals for prosthetic leg. CM has explained to patient that this process is normally followed up on an outpatient basis after surgical follow up. Patient is requesting to know if CM can make this happen. CM has explained to patient that MD can complete a referral but CM is unable to make a referral for prosthetic fitting. Patient also inquires about housing stating that he has no where to go other than the [facility] park once he is discharged. CM updated patient on previous conversation that was documented with SW about housing. Patient does not utilize [facility] due to being in a wheelchair and not having anyone to push him around in the wheelchair. Patient confirms at this time that [facility] is not an option for him. Patient states that he understands that resources such as hotel rooms, tiny houses are options. Cm asked patient if he has the resources for these options and he states no.  Per previous documentation TOC referral has been sent for ACTT to [facility] and to [facility].  Resources for shelters have been added to AVS.    Expected Discharge Plan: Homeless Shelter Barriers to Discharge: Continued Medical Work up  Expected Discharge Plan and Services In-house Referral: NA Discharge Planning Services: NA Post Acute Care Choice: NA Living arrangements for the past 2 months: Homeless                 DME Arranged: N/A DME Agency: NA                   Social Determinants of Health  (Loop) Interventions [place]: Food Insecurity Present (11/24/2022)  Housing: Medium Risk (11/24/2022)  Transportation Needs: Unmet Transportation Needs (11/25/2022)  Utilities: Not At Risk (11/24/2022)  Alcohol Screen: Medium Risk (06/24/2019)  Depression (PHQ2-9): Medium Risk (02/10/2021)  Tobacco Use: High Risk (11/24/2022)    Readmission Risk Interventions    08/18/2022    9:00 AM 07/18/2022    4:11 PM 04/08/2022    1:43 PM  Readmission Risk Prevention Plan  Transportation Screening Complete Complete Complete  Medication Review Press photographer) Complete Complete Complete  PCP or Specialist appointment within 3-5 days of discharge Complete Complete Complete  HRI or Home Care Consult Complete Complete Not Complete  HRI or Home Care Consult Pt Refusal Comments   Pt is homeless  SW Recovery Care/Counseling Consult Complete Complete Complete  Palliative Care Screening Not Applicable  Not [facility] Not Applicable Complete Not Applicable

## 2022-12-01 NOTE — Progress Notes (Signed)
  Progress Note   Patient: [name] KCM:[id number] DOB: Jun 27, 1966 DOA: 11/23/2022     6 DOS: the patient was seen and examined on 12/01/2022   Brief hospital course: 57 y.o. male with medical history significant of asthma/COPD, bipolar 1 disorder, depression, schizoaffective disorder, homelessness, GERD, hypertension, QT prolongation, type 2 diabetes, alcohol abuse, cocaine and cannabis abuse, tobacco use, chronic combined systolic and diastolic heart failure, right AKA who came into the emergency department due to feeling suicidal since 3 days ago, left lower extremity edema and worsening wounds.  He has not taking his medications since earlier this month.  He has been drinking EtOH, smoking cannabis and using cocaine. He stated that he planned to commit suicide and was admitted to the hospital for a COPD exacerbation.    Assessment and Plan: Principal Problem:   COPD with acute exacerbation -now on steroids and neb tx -Pt has been taking ACEI chronically. Held given possible cough side effect -cough now improved, essentially resolved with no appreciable wheezing on exam   Active Problems: Suicidal intentions - had been continued with sitter, now off -Seen by Psychiatry, as of 12/27, no evidence of imminent risk to self or others, psychiatry signed off    Open wounds involving multiple regions of lower extremity Right BKA - will continue wound care - arterial doppler is normal - Seen by Podiatry 12/26 who recommends outpatient wound care f/u, no plan for surgery, recs to continue augmentin - Seen by wound care, recs noted -Recommend pt continue with daily dressing changes as recommended by WOC. RN documentation noted of pt refusing dressing changes -Foot pain seems improved when premedicating prior to dressing changes   H/o chronic PE - refused Lovenox- resumed Elquis     Schizoaffective disorder, bipolar type (HCC) - cont Abilify, Lexapro and Trazodone     Tobacco use  disorder, Cocaine use, ETOH abuse - counseled     Homeless- TOC following  HTN -BP suboptimally controlled since holding ACEI -Imdur started 12/30.  -Have added low dose cozaar 05/69  Chronic systolic CHF -known VX<48 - Pt educated extensively, pt understanding he needs to abstain from ETOH and cocaine        Subjective: Reports foot pain is now improving, asking to speak with social worker regarding discharge needs  Physical Exam: Vitals:   11/30/22 1752 11/30/22 2001 12/01/22 0421 12/01/22 0757  BP:  127/72 (!) 144/97   Pulse:  89 96   Resp:  18 18   Temp:  98 F (36.7 C) 98.1 F (36.7 C)   TempSrc:  Oral Oral   SpO2: 98% 99% 100% 96%  Weight:      Height:       General exam: Conversant, in no acute distress Respiratory system: normal chest rise, clear, no audible wheezing Cardiovascular system: regular rhythm, s1-s2 Gastrointestinal system: Nondistended, nontender, pos BS Central nervous system: No seizures, no tremors Extremities: No cyanosis, no joint deformities, L ankle with dressings in place Skin: No rashes, no pallor Psychiatry: Affect normal // no auditory hallucinations   Data Reviewed:  Labs reviewed: Na 138, K 3.5, Cr 1.02, WBC 11.7  Family Communication: Pt in room, family not at bedside  Disposition: Status is: Inpatient Remains inpatient appropriate because: Severity of illness  Planned Discharge Destination: Home    Author: Marylu Lund, MD 12/01/2022 4:55 PM  For on call review [email or web address].

## 2022-12-02 DIAGNOSIS — L03116 Cellulitis of left lower limb: Secondary | ICD-10-CM | POA: Diagnosis not present

## 2022-12-02 DIAGNOSIS — J441 Chronic obstructive pulmonary disease with (acute) exacerbation: Secondary | ICD-10-CM | POA: Diagnosis not present

## 2022-12-02 LAB — GLUCOSE, CAPILLARY
Glucose-Capillary: 108 mg/dL — ABNORMAL HIGH (ref 70–99)
Glucose-Capillary: 164 mg/dL — ABNORMAL HIGH (ref 70–99)
Glucose-Capillary: 171 mg/dL — ABNORMAL HIGH (ref 70–99)
Glucose-Capillary: 166 mg/dL — ABNORMAL HIGH (ref 70–99)

## 2022-12-02 MED ORDER — GABAPENTIN 300 MG PO CAPS
300.0000 mg | ORAL_CAPSULE | Freq: Once | ORAL | Status: AC
  Administered 2022-12-02: 300 mg via ORAL
  Filled 2022-12-02: qty 1

## 2022-12-02 MED ORDER — GABAPENTIN 300 MG PO CAPS
900.0000 mg | ORAL_CAPSULE | Freq: Three times a day (TID) | ORAL | Status: DC
  Administered 2022-12-02 – 2022-12-03 (×3): 900 mg via ORAL
  Filled 2022-12-02 (×3): qty 3

## 2022-12-02 NOTE — Progress Notes (Signed)
  Progress Note   Patient: [name] LKJ:[id number] DOB: 1966/08/11 DOA: 11/23/2022     7 DOS: the patient was seen and examined on 12/02/2022   Brief hospital course: 57 y.o. male with medical history significant of asthma/COPD, bipolar 1 disorder, depression, schizoaffective disorder, homelessness, GERD, hypertension, QT prolongation, type 2 diabetes, alcohol abuse, cocaine and cannabis abuse, tobacco use, chronic combined systolic and diastolic heart failure, right AKA who came into the emergency department due to feeling suicidal since 3 days ago, left lower extremity edema and worsening wounds.  He has not taking his medications since earlier this month.  He has been drinking EtOH, smoking cannabis and using cocaine. He stated that he planned to commit suicide and was admitted to the hospital for a COPD exacerbation.    Assessment and Plan: Principal Problem:   COPD with acute exacerbation -Now essentially resolved after nebs and steroids -Pt has been taking ACEI chronically. Held given possible cough side effect -no appreciable wheezing on exam   Active Problems: Suicidal intentions - had been continued with sitter, now off -Seen by Psychiatry, as of 12/27, no evidence of imminent risk to self or others, psychiatry signed off    Open wounds involving multiple regions of lower extremity Right BKA - will continue wound care - arterial doppler is normal - Seen by Podiatry 12/26 who recommends outpatient wound care f/u, no plan for surgery, recs to continue augmentin - Seen by wound care, recs noted -Recommend pt continue with daily dressing changes as recommended by WOC. RN documentation noted of pt refusing dressing changes -Main issue continues to be poorly controlled pain despite increasing PO oxy dose and frequency. Have increased gabapentin to 900mg  TID   H/o chronic PE - refused Lovenox- resumed Elquis     Schizoaffective disorder, bipolar type (HCC) - cont Abilify,  Lexapro and Trazodone     Tobacco use disorder, Cocaine use, ETOH abuse - counseled     Homeless- TOC following  HTN -Imdur started 12/30.  -Have added low dose cozaar 12/31 -BP stable  Chronic systolic CHF -known VX<48 - Pt educated extensively, pt understanding he needs to abstain from ETOH and cocaine, pt agrees        Subjective: Primary complaint is uncontrolled foot pain despite increasing opioids recently.   Physical Exam: Vitals:   12/01/22 2003 12/02/22 0550 12/02/22 1013 12/02/22 1409  BP: (!) 144/76 (!) 161/103 (!) 163/98 (!) 139/97  Pulse: 79 88  89  Resp: 18 18  18   Temp: (!) 97.4 F (36.3 C) 97.8 F (36.6 C)  97.7 F (36.5 C)  TempSrc: Oral Oral  Oral  SpO2: 100% 94%  98%  Weight:      Height:       General exam: Awake, laying in bed, in nad Respiratory system: Normal respiratory effort, no wheezing Cardiovascular system: regular rate, s1, s2 Gastrointestinal system: Soft, nondistended, positive BS Central nervous system: CN2-12 grossly intact, strength intact Extremities: Perfused, no clubbing Skin: Normal skin turgor, LLE with dressings in place Psychiatry: Mood normal // no visual hallucinations   Data Reviewed:  There are no new results to review at this time.  Family Communication: Pt in room, family not at bedside  Disposition: Status is: Inpatient Remains inpatient appropriate because: Severity of illness  Planned Discharge Destination: Home    Author: Marylu Lund, MD 12/02/2022 4:03 PM  For on call review [email or web address].

## 2022-12-03 ENCOUNTER — Other Ambulatory Visit (HOSPITAL_COMMUNITY): Payer: Self-pay

## 2022-12-03 DIAGNOSIS — J441 Chronic obstructive pulmonary disease with (acute) exacerbation: Secondary | ICD-10-CM | POA: Diagnosis not present

## 2022-12-03 LAB — CBC
HCT: 43.4 % (ref 39.0–52.0)
Hemoglobin: 12.9 g/dL — ABNORMAL LOW (ref 13.0–17.0)
MCH: 30.5 pg (ref 26.0–34.0)
MCHC: 29.7 g/dL — ABNORMAL LOW (ref 30.0–36.0)
MCV: 102.6 fL — ABNORMAL HIGH (ref 80.0–100.0)
Platelets: 373 10*3/uL (ref 150–400)
RBC: 4.23 MIL/uL (ref 4.22–5.81)
RDW: 16.9 % — ABNORMAL HIGH (ref 11.5–15.5)
WBC: 12.5 10*3/uL — ABNORMAL HIGH (ref 4.0–10.5)
nRBC: 0 % (ref 0.0–0.2)

## 2022-12-03 LAB — COMPREHENSIVE METABOLIC PANEL
ALT: 15 U/L (ref 0–44)
AST: 13 U/L — ABNORMAL LOW (ref 15–41)
Albumin: 3 g/dL — ABNORMAL LOW (ref 3.5–5.0)
Alkaline Phosphatase: 52 U/L (ref 38–126)
Anion gap: 11 (ref 5–15)
BUN: 23 mg/dL — ABNORMAL HIGH (ref 6–20)
CO2: 21 mmol/L — ABNORMAL LOW (ref 22–32)
Calcium: 8.1 mg/dL — ABNORMAL LOW (ref 8.9–10.3)
Chloride: 103 mmol/L (ref 98–111)
Creatinine, Ser: 0.9 mg/dL (ref 0.61–1.24)
GFR, Estimated: >60 mL/min (ref >60)
Glucose, Bld: 88 mg/dL (ref 70–99)
Potassium: 3.9 mmol/L (ref 3.5–5.1)
Sodium: 135 mmol/L (ref 135–145)
Total Bilirubin: 0.4 mg/dL (ref 0.3–1.2)
Total Protein: 7 g/dL (ref 6.5–8.1)

## 2022-12-03 LAB — GLUCOSE, CAPILLARY: Glucose-Capillary: 124 mg/dL — ABNORMAL HIGH (ref 70–99)

## 2022-12-03 MED ORDER — VITAMIN B-1 100 MG PO TABS
100.0000 mg | ORAL_TABLET | Freq: Every day | ORAL | 0 refills | Status: DC
  Filled 2022-12-03: qty 30, 30d supply, fill #0

## 2022-12-03 MED ORDER — BUDESONIDE 0.5 MG/2ML IN SUSP
0.5000 mg | Freq: Two times a day (BID) | RESPIRATORY_TRACT | Status: DC
  Administered 2022-12-03: 0.5 mg via RESPIRATORY_TRACT
  Filled 2022-12-03: qty 2

## 2022-12-03 MED ORDER — AMOXICILLIN-POT CLAVULANATE 500-125 MG PO TABS
1.0000 | ORAL_TABLET | Freq: Three times a day (TID) | ORAL | Status: DC
  Filled 2022-12-03 (×2): qty 1

## 2022-12-03 MED ORDER — ISOSORBIDE MONONITRATE ER 30 MG PO TB24
15.0000 mg | ORAL_TABLET | Freq: Every day | ORAL | 0 refills | Status: DC
  Filled 2022-12-03: qty 15, 30d supply, fill #0

## 2022-12-03 MED ORDER — AMOXICILLIN-POT CLAVULANATE 500-125 MG PO TABS
1.0000 | ORAL_TABLET | Freq: Three times a day (TID) | ORAL | 0 refills | Status: DC
  Filled 2022-12-03: qty 21, 7d supply, fill #0

## 2022-12-03 MED ORDER — GABAPENTIN 300 MG PO CAPS
900.0000 mg | ORAL_CAPSULE | Freq: Three times a day (TID) | ORAL | 0 refills | Status: DC
  Filled 2022-12-03: qty 270, 30d supply, fill #0

## 2022-12-03 MED ORDER — OXYCODONE HCL 5 MG PO TABS
5.0000 mg | ORAL_TABLET | ORAL | 0 refills | Status: DC | PRN
  Filled 2022-12-03: qty 20, 2d supply, fill #0

## 2022-12-03 MED ORDER — LOSARTAN POTASSIUM 25 MG PO TABS
25.0000 mg | ORAL_TABLET | Freq: Every day | ORAL | 0 refills | Status: DC
  Filled 2022-12-03: qty 30, 30d supply, fill #0

## 2022-12-03 MED ORDER — FOLIC ACID 1 MG PO TABS
1.0000 mg | ORAL_TABLET | Freq: Every day | ORAL | 0 refills | Status: DC
  Filled 2022-12-03: qty 30, 30d supply, fill #0

## 2022-12-03 NOTE — TOC Transition Note (Addendum)
Transition of Care [facility]) - CM/SW Discharge Note   Patient Details  Name: [name] MRN: [id number] Date of Birth: Aug 10, 1966  [facility]) CM/SW Contact:  Henrietta Dine, RN Phone Number: 12/03/2022, 12:02 PM   Clinical Narrative:    Pt to d/c today; spoke w/ pt in room; he is concerned about getting his medication; arranged meds to bed for pt; per previous TOC notes, pt given community resource guide for shelters; the pt says he would like to go to [facility]; spoke w/ Mateo Flow at [facility] and she says they no longer have a shelter facility b/c it had to be torn down; spoke w/ Judeen Hammans at [facility] and she says they have no availability, but the pt can be added to the list by calling their hotline [phone]); pt says he would like be added to wait list and but he says he does not have an alternate phone # or [place]; pt given previously discussed option of going to [facility]; the pt declines [facility] and says he will go back to [facility] [address] [address]; he will be transported by [facility]; Buyer, retail and Release of Liability signed by pt; awaiting deliver of medications; called [facility] and spoke w/ Almyra Free; scheduled 2pm p/u at [facility] ED entrance; she was also given Chasity, RN phone # [phone] and she will be called upon arrival; no TOC needs.  Final next level of care: Homeless Shelter Barriers to Discharge: Continued Medical Work up   Patient Goals and CMS Choice CMS Medicare.gov Compare Post Acute Care list provided to:: Patient Choice offered to / list presented to : Patient  Discharge Placement                         Discharge Plan and Services Additional resources added to the After Visit Summary for   In-house Referral: NA Discharge Planning Services: NA Post Acute Care Choice: NA          DME Arranged: N/A DME Agency: NA                  Social Determinants of Health ([place])  Interventions [place]: Food Insecurity Present (11/24/2022)  Housing: Medium Risk (11/24/2022)  Transportation Needs: Unmet Transportation Needs (11/25/2022)  Utilities: Not At Risk (11/24/2022)  Alcohol Screen: Medium Risk (06/24/2019)  Depression (PHQ2-9): Medium Risk (02/10/2021)  Tobacco Use: High Risk (11/24/2022)     Readmission Risk Interventions    12/01/2022    3:56 PM 08/18/2022    9:00 AM 07/18/2022    4:11 PM  Readmission Risk Prevention Plan  Transportation Screening Complete Complete Complete  Medication Review Press photographer) Complete Complete Complete  PCP or Specialist appointment within 3-5 days of discharge Complete Complete Complete  HRI or Home Care Consult Complete Complete Complete  SW Recovery Care/Counseling Consult Complete Complete Complete  Palliative Care Screening Not Applicable Not [place] Not Applicable Not Applicable Complete  SNF Comments n/a

## 2022-12-03 NOTE — Plan of Care (Signed)
  Problem: Education: Goal: Knowledge of disease or condition will improve Outcome: Progressing Goal: Knowledge of the prescribed therapeutic regimen will improve Outcome: Progressing Goal: Individualized Educational Video(s) Outcome: Progressing   Problem: Activity: Goal: Ability to tolerate increased activity will improve Outcome: Progressing Goal: Will verbalize the importance of balancing activity with adequate rest periods Outcome: Progressing   Problem: Respiratory: Goal: Ability to maintain a clear airway will improve Outcome: Progressing Goal: Levels of oxygenation will improve Outcome: Progressing Goal: Ability to maintain adequate ventilation will improve Outcome: Progressing   Problem: Education: Goal: Ability to describe self-care measures that may prevent or decrease complications (Diabetes Survival Skills Education) will improve Outcome: Progressing Goal: Individualized Educational Video(s) Outcome: Progressing   Problem: Coping: Goal: Ability to adjust to condition or change in health will improve Outcome: Progressing   Problem: Fluid Volume: Goal: Ability to maintain a balanced intake and output will improve Outcome: Progressing   Problem: Health Behavior/Discharge Planning: Goal: Ability to identify and utilize available resources and services will improve Outcome: Progressing Goal: Ability to manage health-related needs will improve Outcome: Progressing   Problem: Metabolic: Goal: Ability to maintain appropriate glucose levels will improve Outcome: Progressing   Problem: Nutritional: Goal: Maintenance of adequate nutrition will improve Outcome: Progressing Goal: Progress toward achieving an optimal weight will improve Outcome: Progressing   Problem: Skin Integrity: Goal: Risk for impaired skin integrity will decrease Outcome: Progressing   Problem: Tissue Perfusion: Goal: Adequacy of tissue perfusion will improve Outcome: Progressing    Problem: Education: Goal: Knowledge of General Education information will improve Description: Including pain rating scale, medication(s)/side effects and non-pharmacologic comfort measures Outcome: Progressing   Problem: Health Behavior/Discharge Planning: Goal: Ability to manage health-related needs will improve Outcome: Progressing   Problem: Clinical Measurements: Goal: Ability to maintain clinical measurements within normal limits will improve Outcome: Progressing Goal: Will remain free from infection Outcome: Progressing Goal: Diagnostic test results will improve Outcome: Progressing Goal: Respiratory complications will improve Outcome: Progressing Goal: Cardiovascular complication will be avoided Outcome: Progressing   Problem: Activity: Goal: Risk for activity intolerance will decrease Outcome: Progressing   Problem: Nutrition: Goal: Adequate nutrition will be maintained Outcome: Progressing   Problem: Coping: Goal: Level of anxiety will decrease Outcome: Progressing   Problem: Elimination: Goal: Will not experience complications related to bowel motility Outcome: Progressing Goal: Will not experience complications related to urinary retention Outcome: Progressing   Problem: Pain Managment: Goal: General experience of comfort will improve Outcome: Progressing   Problem: Safety: Goal: Ability to remain free from injury will improve Outcome: Progressing   Problem: Skin Integrity: Goal: Risk for impaired skin integrity will decrease Outcome: Progressing   

## 2022-12-03 NOTE — Discharge Summary (Signed)
Physician Discharge Summary  [name] LKJ:[id number] DOB: Dec 29, 1965 DOA: 11/23/2022  PCP: [name], NP  Admit date: 11/23/2022  Discharge date: 12/03/2022  Admitted From:Homeless   Disposition:  Homeless shelter  Recommendations for Outpatient Follow-up:  Follow up with PCP in 1-2 weeks Follow-up with [facility] Continue Augmentin as prescribed for 7 days to complete course of treatment Continue gabapentin as ordered for neuropathic pain as well as oxycodone Continue other home medications as noted below for blood pressure management and others Continue Eliquis for chronic PE  Home Health: None  Equipment/Devices: Has wheelchair  Discharge Condition:Stable  CODE STATUS: Full  Diet recommendation: Heart Healthy/carb modified  Brief/Interim Summary: 57 y.o. male with medical history significant of asthma/COPD, bipolar 1 disorder, depression, schizoaffective disorder, homelessness, GERD, hypertension, QT prolongation, type 2 diabetes, alcohol abuse, cocaine and cannabis abuse, tobacco use, chronic combined systolic and diastolic heart failure, right AKA who came into the emergency department due to feeling suicidal since 3 days ago, left lower extremity edema and worsening wounds.  He has not taking his medications since earlier this month.  He has been drinking EtOH, smoking cannabis and using cocaine. He stated that he planned to commit suicide and was admitted to the hospital for a COPD exacerbation.   Patient was started on treatment with nebulizers and steroids and COPD has now resolved.  He was seen by psychiatry 12/27 and was not noted to be an imminent risk to himself or others and psychiatry had signed off.  He was seen by podiatry 12/26 regarding open wounds to his left lower extremity.  Arterial Doppler studies were normal and patient was recommended to remain on oral Augmentin for wound care and follow-up outpatient.  He continued to have  significant complaints of poorly controlled pain and gabapentin dose has been increased to 900 mg 3 times daily and he remains on oral oxycodone.  He continues to have ongoing pain complaints, but appears comfortable at bedside and is stable for discharge today.  Discharge Diagnoses:  Principal Problem:   COPD with acute exacerbation (HCC) Active Problems:   Schizoaffective disorder, bipolar type (HCC)   Tobacco use disorder   Homeless   Cocaine abuse (HCC)   Hyperlipidemia   Coronary artery disease involving native coronary artery of native heart without angina pectoris   Chronic combined systolic and diastolic congestive heart failure (HCC)   Type 2 diabetes mellitus with diabetic polyneuropathy, without long-term current use of insulin (HCC)   Open wounds involving multiple regions of lower extremity   Prolonged QT interval   Alcohol abuse   Acid reflux   COPD exacerbation (HCC)  Principal discharge diagnosis: Acute COPD exacerbation-resolved.  Suicidal ideations-resolved.  Open wounds of left lower extremity with chronic-poorly controlled pain.  Discharge Instructions  Discharge Instructions     Diet - low sodium heart healthy   Complete by: As directed    Discharge wound care:   Complete by: As directed    Wound care  Daily      Comments: Wound care to left LE medial and lateral wounds, full thickness (lateral >medial):  Wash LE with soap and water, rinse and dry. Apply Eucerin cream to LE (do not apply over wounds or between digits). Apply size appropriate piece of silver hydrofiber (Aquacel Ag+ Advantage, Lawson # [id number]) to wounds, top with ABD pads and secure by wrapping from just below toes to just below knee with Kerlix roll gauze. Top Kerlix with a 6-inch ACE  bandage applied in a similar manner. Change daily.   Increase activity slowly   Complete by: As directed       Allergies as of 12/03/2022       Reactions   Motrin [ibuprofen] Anaphylaxis   Tylenol  [acetaminophen] Hives, Nausea And Vomiting, Rash   Lyrica Cr [pregabalin Er] Other (See Comments)   Dystonia Dysarthria    Peanut-containing Drug Products Other (See Comments)   Pt denies allergy to peanut butter 03/02/2021        Medication List     STOP taking these medications    carvedilol 3.125 MG tablet Commonly known as: COREG   lisinopril 10 MG tablet Commonly known as: ZESTRIL       TAKE these medications    amLODipine 10 MG tablet Commonly known as: NORVASC Take 1 tablet (10 mg total) by mouth daily.   amoxicillin-clavulanate 500-125 MG tablet Commonly known as: AUGMENTIN Take 1 tablet by mouth 3 (three) times daily for 7 days.   ARIPiprazole 10 MG tablet Commonly known as: ABILIFY Take 1 tablet (10 mg total) by mouth daily.   Eliquis 5 MG Tabs tablet Generic drug: apixaban Take 1 tablet (5 mg total) by mouth 2 (two) times daily.   escitalopram 5 MG tablet Commonly known as: LEXAPRO Take 1 tablet (5 mg total) by mouth daily.   folic acid 1 MG tablet Commonly known as: FOLVITE Take 1 tablet (1 mg total) by mouth daily.   gabapentin 300 MG capsule Commonly known as: NEURONTIN Take 3 capsules (900 mg total) by mouth 3 (three) times daily. What changed: how much to take   isosorbide mononitrate 30 MG 24 hr tablet Commonly known as: IMDUR Take 0.5 tablets (15 mg total) by mouth daily.   losartan 25 MG tablet Commonly known as: COZAAR Take 1 tablet (25 mg total) by mouth daily.   oxyCODONE 5 MG immediate release tablet Commonly known as: Oxy IR/ROXICODONE Take 1-2 tablets (5-10 mg total) by mouth every 4 (four) hours as needed for moderate pain. What changed:  how much to take when to take this reasons to take this   thiamine 100 MG tablet Commonly known as: Vitamin B-1 Take 1 tablet (100 mg total) by mouth daily.   traZODone 50 MG tablet Commonly known as: DESYREL Take 1 tablet (50 mg total) by mouth at bedtime as needed for sleep.    Ventolin HFA 108 (90 Base) MCG/ACT inhaler Generic drug: albuterol Inhale 2 puffs into the lungs every 6 (six) hours as needed for wheezing or shortness of breath.               Discharge Care Instructions  (From admission, onward)           Start     Ordered   12/03/22 0000  Discharge wound care:       Comments: Wound care  Daily      Comments: Wound care to left LE medial and lateral wounds, full thickness (lateral >medial):  Wash LE with soap and water, rinse and dry. Apply Eucerin cream to LE (do not apply over wounds or between digits). Apply size appropriate piece of silver hydrofiber (Aquacel Ag+ Advantage, [employer] # [id number]) to wounds, top with ABD pads and secure by wrapping from just below toes to just below knee with Kerlix roll gauze. Top Kerlix with a 6-inch ACE bandage applied in a similar manner. Change daily.   12/03/22 1106  Follow-up Information     [name], [name], NP. Schedule an appointment as soon as possible for a visit in 1 week(s).   Contact information: [place] [address] [phone]                Allergies  Allergen Reactions   Motrin [Ibuprofen] Anaphylaxis   Tylenol [Acetaminophen] Hives, Nausea And Vomiting and Rash   Lyrica Cr [Pregabalin Er] Other (See Comments)    Dystonia Dysarthria    Peanut-Containing Drug Products Other (See Comments)    Pt denies allergy to peanut butter 03/02/2021    Consultations: Podiatry Psychiatry   Procedures/Studies: VAS [place] LOWER EXTREMITY ARTERIAL DUPLEX  Result Date: 11/25/2022 LOWER EXTREMITY ARTERIAL DUPLEX STUDY Patient Name:  [name]  Date of Exam:   11/25/2022 Medical Rec #: [id number]       Accession #:    [id number] Date of Birth: 01-Feb-1966       Patient Gender: M Patient Age:   42 years Exam Location:  [facility] Procedure:      VAS [place] LOWER EXTREMITY ARTERIAL DUPLEX Referring Phys: ADAM MCDONALD  --------------------------------------------------------------------------------  Indications: Ulceration. High Risk Factors: Hypertension, hyperlipidemia, Diabetes. Other Factors: 01/02/2021 - AMPUTATION TOE Bilateral.  Current ABI: 01/30/2022 - R 1.03 L 1.02 Limitations: patient pain tolerance, patient constant movement Comparison Study: No prior studies. Performing Technologist: Oliver Hum RVT  Examination Guidelines: A complete evaluation includes B-mode imaging, spectral Doppler, color Doppler, and power Doppler as needed of all accessible portions of each vessel. Bilateral testing is considered an integral part of a complete examination. Limited examinations for reoccurring indications may be performed as noted.   +-----------+--------+-----+--------+--------+--------+ LEFT       PSV cm/sRatioStenosisWaveformComments +-----------+--------+-----+--------+--------+--------+ CFA Distal 122                  biphasic         +-----------+--------+-----+--------+--------+--------+ DFA        34                   biphasic         +-----------+--------+-----+--------+--------+--------+ SFA Prox   133                  biphasic         +-----------+--------+-----+--------+--------+--------+ SFA Mid    72                   biphasic         +-----------+--------+-----+--------+--------+--------+ SFA Distal 107                  biphasic         +-----------+--------+-----+--------+--------+--------+ POP Prox   96                   biphasic         +-----------+--------+-----+--------+--------+--------+ POP Distal 79                   biphasic         +-----------+--------+-----+--------+--------+--------+ ATA Prox   70                   biphasic         +-----------+--------+-----+--------+--------+--------+ ATA Mid    79                   biphasic         +-----------+--------+-----+--------+--------+--------+ ATA Distal 97  biphasic         +-----------+--------+-----+--------+--------+--------+ PTA Prox   108                  biphasic         +-----------+--------+-----+--------+--------+--------+ PTA Mid    90                   biphasic         +-----------+--------+-----+--------+--------+--------+ PTA Distal 62                   biphasic         +-----------+--------+-----+--------+--------+--------+ PERO Prox  51                   biphasic         +-----------+--------+-----+--------+--------+--------+ PERO Mid   75                   biphasic         +-----------+--------+-----+--------+--------+--------+ PERO Distal47                   biphasic         +-----------+--------+-----+--------+--------+--------+  Summary: Left: Biphasic waveforms noted throughout the left arterial system. No significant areas of stenosis noted.  See table(s) above for measurements and observations. Electronically signed by Jamelle Haring on 11/25/2022 at 4:32:38 PM.    Final    CT Angio Chest PE W and/or Wo Contrast  Result Date: 11/24/2022 CLINICAL DATA:  Pulmonary embolism suspected, high probability. Left leg swelling, cough, wheezing, and shortness of breath. EXAM: CT ANGIOGRAPHY CHEST WITH CONTRAST TECHNIQUE: Multidetector CT imaging of the chest was performed using the standard protocol during bolus administration of intravenous contrast. Multiplanar CT image reconstructions and MIPs were obtained to evaluate the vascular anatomy. RADIATION DOSE REDUCTION: This exam was performed according to the departmental dose-optimization program which includes automated exposure control, adjustment of the mA and/or kV according to patient size and/or use of iterative reconstruction technique. CONTRAST:  99mL OMNIPAQUE IOHEXOL 350 MG/ML SOLN COMPARISON:  08/16/2022. FINDINGS: Cardiovascular: The heart is enlarged and there is no pericardial effusion. Scattered coronary artery calcifications are noted. There  is atherosclerotic calcification of the aorta without evidence of aneurysm. The pulmonary trunk is distended suggesting underlying pulmonary artery hypertension. A small segmental pulmonary artery filling defect is identified in the right lower lobe and left lower lobe. No evidence of right heart strain. Mediastinum/Nodes: No mediastinal lymphadenopathy. There is an enlarged lymph node at the right hilum measuring 1.8 cm. No axillary lymphadenopathy. The thyroid gland, trachea, and esophagus are within normal limits. Lungs/Pleura: Advanced emphysematous changes are present in the lungs. Bronchial wall thickening is noted bilaterally with patchy airspace disease in the lower lobes bilaterally and right middle lobe. There is mild residual consolidation in the posterior aspect of the right upper lobe. No effusion or pneumothorax. Upper Abdomen: There is reflux of contrast into the inferior vena cava and hepatic veins, suggesting right heart failure. No acute abnormality. Musculoskeletal: Degenerative changes in the thoracic spine. No acute osseous abnormality. Review of the MIP images confirms the above findings. IMPRESSION: 1. Small segmental pulmonary emboli in the lower lobes bilaterally, which may be residual embolus from prior pulmonary embolism in 08/16/2022 and decreased from prior exam. No evidence of right heart strain. 2. Bronchial wall thickening bilaterally with patchy airspace disease in the lower lobes bilaterally and right middle lobe. Mild residual airspace disease in the right upper lobe. 3. Emphysema.  4. Cardiomegaly with coronary artery calcifications. 5. Distended pulmonary trunk suggesting underlying pulmonary artery hypertension. 6. Aortic atherosclerosis. Electronically Signed   By: Brett Fairy M.D.   On: 11/24/2022 03:43   DG Chest Port 1 View  Result Date: 11/23/2022 CLINICAL DATA:  Cough. EXAM: PORTABLE CHEST 1 VIEW COMPARISON:  08/20/2022, 08/16/2022. FINDINGS: Heart is enlarged and  the mediastinal contour is within normal limits. Persistent opacity is noted in the mid right lung which may be associated with previous pulmonary infarction. The left lung is clear. No effusion or pneumothorax. No acute osseous abnormality IMPRESSION: 1. Persistent opacity in the mid right lung which may be associated with previously described pulmonary infarct. The possibility of superimposed infection can not be excluded. 2. Cardiomegaly. Electronically Signed   By: Brett Fairy M.D.   On: 11/23/2022 23:55   DG Foot Complete Left  Result Date: 11/23/2022 CLINICAL DATA:  Open wounds on medial and lateral lateral malleoli. Concern for osteomyelitis. EXAM: LEFT FOOT - COMPLETE 3+ VIEW; LEFT ANKLE COMPLETE - 3+ VIEW COMPARISON:  None Available. FINDINGS: There is no evidence of fracture or dislocation. No periosteal elevation or bony erosion. Large calcaneal spur. 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 Degenerative changes are present in the midfoot. Soft tissue swelling is present about the ankle and over the dorsum of the foot. IMPRESSION: No radiographic evidence of osteomyelitis. Electronically Signed   By: Brett Fairy M.D.   On: 11/23/2022 23:52   DG Ankle Complete Left  Result Date: 11/23/2022 CLINICAL DATA:  Open wounds on medial and lateral lateral malleoli. Concern for osteomyelitis. EXAM: LEFT FOOT - COMPLETE 3+ VIEW; LEFT ANKLE COMPLETE - 3+ VIEW COMPARISON:  None Available. FINDINGS: There is no evidence of fracture or dislocation. No periosteal elevation or bony erosion. Large calcaneal spur. 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 Degenerative changes are present in the midfoot. Soft tissue swelling is present about the ankle and over the dorsum of the foot. IMPRESSION: No radiographic evidence of osteomyelitis. Electronically Signed   By: Brett Fairy M.D.   On: 11/23/2022 23:52     Discharge Exam: Vitals:   12/02/22 2134 12/03/22 0920  BP: (!) 150/91   Pulse: 87    Resp: 14   Temp: 97.6 F (36.4 C)   SpO2: 94% 93%   Vitals:   12/02/22 1409 12/02/22 2134 12/03/22 0500 12/03/22 0920  BP: (!) 139/97 (!) 150/91    Pulse: 89 87    Resp: 18 14    Temp: 97.7 F (36.5 C) 97.6 F (36.4 C)    TempSrc: Oral Oral    SpO2: 98% 94%  93%  Weight:   83.1 kg   Height:        General: Pt is alert, awake, not in acute distress Cardiovascular: RRR, S1/S2 +, no rubs, no gallops Respiratory: CTA bilaterally, no wheezing, no rhonchi Abdominal: Soft, NT, ND, bowel sounds + Extremities: Right BKA, left foot in dressings C/D/I    The results of significant diagnostics from this hospitalization (including imaging, microbiology, ancillary and laboratory) are listed below for reference.     Microbiology: Recent Results (from the past 240 hour(s))  Resp panel by RT-PCR (RSV, Flu A&B, Covid) Anterior Nasal Swab     Status: None   Collection Time: 11/23/22 11:19 PM   Specimen: Anterior Nasal Swab  Result Value Ref Range Status   SARS Coronavirus 2 by RT PCR NEGATIVE NEGATIVE Final    Comment: (NOTE) SARS-CoV-2 target nucleic  acids are NOT DETECTED.  The SARS-CoV-2 RNA is generally detectable in upper respiratory specimens during the acute phase of infection. The lowest concentration of SARS-CoV-2 viral copies this assay can detect is 138 copies/mL. A negative result does not preclude SARS-Cov-2 infection and should not be used as the sole basis for treatment or other patient management decisions. A negative result may occur with  improper specimen collection/handling, submission of specimen other than nasopharyngeal swab, presence of viral mutation(s) within the areas targeted by this assay, and inadequate number of viral copies(<138 copies/mL). A negative result must be combined with clinical observations, patient history, and epidemiological information. The expected result is Negative.  Fact Sheet for Patients:   EntrepreneurPulse.com.au  Fact Sheet for Healthcare Providers:  IncredibleEmployment.be  This test is no t yet approved or cleared by the [place] FDA and  has been authorized for detection and/or diagnosis of SARS-CoV-2 by FDA under an Emergency Use Authorization (EUA). This EUA will remain  in effect (meaning this test can be used) for the duration of the COVID-19 declaration under Section 564(b)(1) of the Act, 21 U.S.C.section 360bbb-3(b)(1), unless the authorization is terminated  or revoked sooner.       Influenza A by PCR NEGATIVE NEGATIVE Final   Influenza B by PCR NEGATIVE NEGATIVE Final    Comment: (NOTE) The Xpert Xpress SARS-CoV-2/FLU/RSV plus assay is intended as an aid in the diagnosis of influenza from Nasopharyngeal swab specimens and should not be used as a sole basis for treatment. Nasal washings and aspirates are unacceptable for Xpert Xpress SARS-CoV-2/FLU/RSV testing.  Fact Sheet for Patients: EntrepreneurPulse.com.au  Fact Sheet for Healthcare Providers: IncredibleEmployment.be  This test is not yet approved or cleared by the [place] FDA and has been authorized for detection and/or diagnosis of SARS-CoV-2 by FDA under an Emergency Use Authorization (EUA). This EUA will remain in effect (meaning this test can be used) for the duration of the COVID-19 declaration under Section 564(b)(1) of the Act, 21 U.S.C. section 360bbb-3(b)(1), unless the authorization is terminated or revoked.     Resp Syncytial Virus by PCR NEGATIVE NEGATIVE Final    Comment: (NOTE) Fact Sheet for Patients: EntrepreneurPulse.com.au  Fact Sheet for Healthcare Providers: IncredibleEmployment.be  This test is not yet approved or cleared by the [place] FDA and has been authorized for detection and/or diagnosis of SARS-CoV-2 by FDA under an Emergency Use  Authorization (EUA). This EUA will remain in effect (meaning this test can be used) for the duration of the COVID-19 declaration under Section 564(b)(1) of the Act, 21 U.S.C. section 360bbb-3(b)(1), unless the authorization is terminated or revoked.  Performed at [facility], [place] [address], [place], [place] [address]   Blood culture (routine x 2)     Status: None   Collection Time: 11/24/22 12:09 AM   Specimen: BLOOD  Result Value Ref Range Status   Specimen Description   Final    BLOOD RIGHT ANTECUBITAL Performed at [facility] [address], [place], [place] [address]    Special Requests   Final    BOTTLES DRAWN AEROBIC AND ANAEROBIC Blood Culture results may not be optimal due to an excessive volume of blood received in culture bottles Performed at [facility] [address], [place], [place] [address]    Culture   Final    NO GROWTH 5 DAYS Performed at [facility], [place] [address], [place], [place] [address]    Report Status 11/29/2022 FINAL  Final  Blood culture (routine x 2)     Status: None   Collection Time: 11/25/22  6:06 AM   Specimen: BLOOD  Result Value Ref Range Status   Specimen Description   Final    BLOOD BLOOD LEFT HAND Performed at [facility] [address] [place], [place] [address]    Special Requests   Final    BOTTLES DRAWN AEROBIC ONLY Blood Culture adequate volume Performed at [facility] [address] [place], [place] [address]    Culture   Final    NO GROWTH 5 DAYS Performed at [facility], [place] [address] [place], [place] [address]    Report Status 11/30/2022 FINAL  Final     Labs: BNP (last 3 results) Recent Labs    07/22/22 0500 08/16/22 1542 11/24/22 0004  BNP 611.6* 1,462.5* 123456*   Basic Metabolic Panel: Recent Labs  Lab 11/27/22 1244 12/01/22 0853 12/03/22 0555  NA 136 138 135  K 4.2 3.5 3.9  CL 104  105 103  CO2 24 23 21*  GLUCOSE 121* 132* 88  BUN 18 21* 23*  CREATININE 0.97 1.02 0.90  CALCIUM 8.5* 8.3* 8.1*   Liver Function Tests: Recent Labs  Lab 11/27/22 1244 12/01/22 0853 12/03/22 0555  AST 12* 12* 13*  ALT 11 12 15   ALKPHOS 61 59 52  BILITOT 0.5 0.4 0.4  PROT 7.3 6.9 7.0  ALBUMIN 2.8* 3.0* 3.0*   No results for input(s): "LIPASE", "AMYLASE" in the last 168 hours. No results for input(s): "AMMONIA" in the last 168 hours. CBC: Recent Labs  Lab 11/27/22 1244 12/01/22 0853 12/03/22 0555  WBC 8.3 11.7* 12.5*  HGB 12.6* 13.0 12.9*  HCT 38.9* 41.5 43.4  MCV 93.5 97.6 102.6*  PLT 375 367 373   Cardiac Enzymes: No results for input(s): "CKTOTAL", "CKMB", "CKMBINDEX", "TROPONINI" in the last 168 hours. BNP: Invalid input(s): "POCBNP" CBG: Recent Labs  Lab 12/01/22 2041 12/02/22 0728 12/02/22 1201 12/02/22 1642 12/02/22 2131  GLUCAP 184* 108* 164* 171* 166*   D-Dimer No results for input(s): "DDIMER" in the last 72 hours. Hgb A1c No results for input(s): "HGBA1C" in the last 72 hours. Lipid Profile No results for input(s): "CHOL", "HDL", "LDLCALC", "TRIG", "CHOLHDL", "LDLDIRECT" in the last 72 hours. Thyroid function studies No results for input(s): "TSH", "T4TOTAL", "T3FREE", "THYROIDAB" in the last 72 hours.  Invalid input(s): "FREET3" Anemia work up No results for input(s): "VITAMINB12", "FOLATE", "FERRITIN", "TIBC", "IRON", "RETICCTPCT" in the last 72 hours. Urinalysis    Component Value Date/Time   COLORURINE YELLOW 08/17/2022 0625   APPEARANCEUR CLEAR 08/17/2022 0625   LABSPEC 1.021 08/17/2022 0625   PHURINE 5.0 08/17/2022 0625   GLUCOSEU NEGATIVE 08/17/2022 0625   HGBUR NEGATIVE 08/17/2022 0625   BILIRUBINUR NEGATIVE 08/17/2022 0625   KETONESUR NEGATIVE 08/17/2022 0625   PROTEINUR NEGATIVE 08/17/2022 0625   NITRITE NEGATIVE 08/17/2022 0625   LEUKOCYTESUR NEGATIVE 08/17/2022 0625   Sepsis Labs Recent Labs  Lab 11/27/22 1244  12/01/22 0853 12/03/22 0555  WBC 8.3 11.7* 12.5*   Microbiology Recent Results (from the past 240 hour(s))  Resp panel by RT-PCR (RSV, Flu A&B, Covid) Anterior Nasal Swab     Status: None   Collection Time: 11/23/22 11:19 PM   Specimen: Anterior Nasal Swab  Result Value Ref Range Status   SARS Coronavirus 2 by RT PCR NEGATIVE NEGATIVE Final    Comment: (NOTE) SARS-CoV-2 target nucleic acids are NOT DETECTED.  The SARS-CoV-2  RNA is generally detectable in upper respiratory specimens during the acute phase of infection. The lowest concentration of SARS-CoV-2 viral copies this assay can detect is 138 copies/mL. A negative result does not preclude SARS-Cov-2 infection and should not be used as the sole basis for treatment or other patient management decisions. A negative result may occur with  improper specimen collection/handling, submission of specimen other than nasopharyngeal swab, presence of viral mutation(s) within the areas targeted by this assay, and inadequate number of viral copies(<138 copies/mL). A negative result must be combined with clinical observations, patient history, and epidemiological information. The expected result is Negative.  Fact Sheet for Patients:  EntrepreneurPulse.com.au  Fact Sheet for Healthcare Providers:  IncredibleEmployment.be  This test is no t yet approved or cleared by the [place] FDA and  has been authorized for detection and/or diagnosis of SARS-CoV-2 by FDA under an Emergency Use Authorization (EUA). This EUA will remain  in effect (meaning this test can be used) for the duration of the COVID-19 declaration under Section 564(b)(1) of the Act, 21 U.S.C.section 360bbb-3(b)(1), unless the authorization is terminated  or revoked sooner.       Influenza A by PCR NEGATIVE NEGATIVE Final   Influenza B by PCR NEGATIVE NEGATIVE Final    Comment: (NOTE) The Xpert Xpress SARS-CoV-2/FLU/RSV plus assay  is intended as an aid in the diagnosis of influenza from Nasopharyngeal swab specimens and should not be used as a sole basis for treatment. Nasal washings and aspirates are unacceptable for Xpert Xpress SARS-CoV-2/FLU/RSV testing.  Fact Sheet for Patients: EntrepreneurPulse.com.au  Fact Sheet for Healthcare Providers: IncredibleEmployment.be  This test is not yet approved or cleared by the [place] FDA and has been authorized for detection and/or diagnosis of SARS-CoV-2 by FDA under an Emergency Use Authorization (EUA). This EUA will remain in effect (meaning this test can be used) for the duration of the COVID-19 declaration under Section 564(b)(1) of the Act, 21 U.S.C. section 360bbb-3(b)(1), unless the authorization is terminated or revoked.     Resp Syncytial Virus by PCR NEGATIVE NEGATIVE Final    Comment: (NOTE) Fact Sheet for Patients: EntrepreneurPulse.com.au  Fact Sheet for Healthcare Providers: IncredibleEmployment.be  This test is not yet approved or cleared by the [place] FDA and has been authorized for detection and/or diagnosis of SARS-CoV-2 by FDA under an Emergency Use Authorization (EUA). This EUA will remain in effect (meaning this test can be used) for the duration of the COVID-19 declaration under Section 564(b)(1) of the Act, 21 U.S.C. section 360bbb-3(b)(1), unless the authorization is terminated or revoked.  Performed at [facility], [place] [address] [place], [place] [address]   Blood culture (routine x 2)     Status: None   Collection Time: 11/24/22 12:09 AM   Specimen: BLOOD  Result Value Ref Range Status   Specimen Description   Final    BLOOD RIGHT ANTECUBITAL Performed at [facility] [address] [place], [place] [address]    Special Requests   Final    BOTTLES DRAWN AEROBIC AND ANAEROBIC Blood Culture results may not  be optimal due to an excessive volume of blood received in culture bottles Performed at [facility] [address] [place], [place] [address]    Culture   Final    NO GROWTH 5 DAYS Performed at [facility], [place] [address], [place], [place] [address]    Report Status 11/29/2022 FINAL  Final  Blood culture (routine x  2)     Status: None   Collection Time: 11/25/22  6:06 AM   Specimen: BLOOD  Result Value Ref Range Status   Specimen Description   Final    BLOOD BLOOD LEFT HAND Performed at [facility] [address], [place], [place] [address]    Special Requests   Final    BOTTLES DRAWN AEROBIC ONLY Blood Culture adequate volume Performed at [facility] [address] [place], [place] [address]    Culture   Final    NO GROWTH 5 DAYS Performed at [facility], [place] [address], [place], [place] [address]    Report Status 11/30/2022 FINAL  Final     Time coordinating discharge: 35 minutes  SIGNED:   Rodena Goldmann, DO [facility] 12/03/2022, 11:15 AM  If 7PM-7AM, please contact night-coverage [email or web address]

## 2022-12-03 NOTE — Plan of Care (Signed)
  Problem: Education: Goal: Knowledge of disease or condition will improve Outcome: Adequate for Discharge Goal: Knowledge of the prescribed therapeutic regimen will improve Outcome: Adequate for Discharge Goal: Individualized Educational Video(s) Outcome: Adequate for Discharge   Problem: Activity: Goal: Ability to tolerate increased activity will improve Outcome: Adequate for Discharge Goal: Will verbalize the importance of balancing activity with adequate rest periods Outcome: Adequate for Discharge   Problem: Respiratory: Goal: Ability to maintain a clear airway will improve Outcome: Adequate for Discharge Goal: Levels of oxygenation will improve Outcome: Adequate for Discharge Goal: Ability to maintain adequate ventilation will improve Outcome: Adequate for Discharge   Problem: Education: Goal: Ability to describe self-care measures that may prevent or decrease complications (Diabetes Survival Skills Education) will improve Outcome: Adequate for Discharge Goal: Individualized Educational Video(s) Outcome: Adequate for Discharge   Problem: Coping: Goal: Ability to adjust to condition or change in health will improve Outcome: Adequate for Discharge   Problem: Fluid Volume: Goal: Ability to maintain a balanced intake and output will improve Outcome: Adequate for Discharge   Problem: Health Behavior/Discharge Planning: Goal: Ability to identify and utilize available resources and services will improve Outcome: Adequate for Discharge Goal: Ability to manage health-related needs will improve Outcome: Adequate for Discharge   Problem: Metabolic: Goal: Ability to maintain appropriate glucose levels will improve Outcome: Adequate for Discharge   Problem: Nutritional: Goal: Maintenance of adequate nutrition will improve Outcome: Adequate for Discharge Goal: Progress toward achieving an optimal weight will improve Outcome: Adequate for Discharge   Problem: Skin  Integrity: Goal: Risk for impaired skin integrity will decrease Outcome: Adequate for Discharge   Problem: Tissue Perfusion: Goal: Adequacy of tissue perfusion will improve Outcome: Adequate for Discharge   Problem: Education: Goal: Knowledge of General Education information will improve Description: Including pain rating scale, medication(s)/side effects and non-pharmacologic comfort measures Outcome: Adequate for Discharge   Problem: Health Behavior/Discharge Planning: Goal: Ability to manage health-related needs will improve Outcome: Adequate for Discharge   Problem: Clinical Measurements: Goal: Ability to maintain clinical measurements within normal limits will improve Outcome: Adequate for Discharge Goal: Will remain free from infection Outcome: Adequate for Discharge Goal: Diagnostic test results will improve Outcome: Adequate for Discharge Goal: Respiratory complications will improve Outcome: Adequate for Discharge Goal: Cardiovascular complication will be avoided Outcome: Adequate for Discharge   Problem: Activity: Goal: Risk for activity intolerance will decrease Outcome: Adequate for Discharge   Problem: Nutrition: Goal: Adequate nutrition will be maintained Outcome: Adequate for Discharge   Problem: Coping: Goal: Level of anxiety will decrease Outcome: Adequate for Discharge   Problem: Elimination: Goal: Will not experience complications related to bowel motility Outcome: Adequate for Discharge Goal: Will not experience complications related to urinary retention Outcome: Adequate for Discharge   Problem: Pain Managment: Goal: General experience of comfort will improve Outcome: Adequate for Discharge   Problem: Safety: Goal: Ability to remain free from injury will improve Outcome: Adequate for Discharge   Problem: Skin Integrity: Goal: Risk for impaired skin integrity will decrease Outcome: Adequate for Discharge

## 2022-12-03 NOTE — Progress Notes (Signed)
Pt refusing blood glucose stick at this time.  Pt educated on importance of needing to check cbg to monitor glucose and provided insulin coverage. Pt still declined.

## 2022-12-05 ENCOUNTER — Other Ambulatory Visit (HOSPITAL_COMMUNITY): Payer: Self-pay

## 2022-12-07 ENCOUNTER — Emergency Department (HOSPITAL_BASED_OUTPATIENT_CLINIC_OR_DEPARTMENT_OTHER): Payer: Medicaid Other

## 2022-12-07 ENCOUNTER — Other Ambulatory Visit: Payer: Self-pay

## 2022-12-07 ENCOUNTER — Emergency Department (HOSPITAL_COMMUNITY): Payer: Medicaid Other

## 2022-12-07 ENCOUNTER — Encounter (HOSPITAL_COMMUNITY): Payer: Self-pay

## 2022-12-07 ENCOUNTER — Inpatient Hospital Stay (HOSPITAL_COMMUNITY)
Admission: EM | Admit: 2022-12-07 | Discharge: 2023-06-16 | DRG: 853 | Disposition: A | Discharge status: Home / Self Care | Payer: Medicaid Other | Attending: Internal Medicine | Admitting: Internal Medicine

## 2022-12-07 DIAGNOSIS — E669 Obesity, unspecified: Secondary | ICD-10-CM | POA: Diagnosis present

## 2022-12-07 DIAGNOSIS — I96 Gangrene, not elsewhere classified: Secondary | ICD-10-CM

## 2022-12-07 DIAGNOSIS — E11621 Type 2 diabetes mellitus with foot ulcer: Secondary | ICD-10-CM | POA: Diagnosis present

## 2022-12-07 DIAGNOSIS — M272 Inflammatory conditions of jaws: Secondary | ICD-10-CM | POA: Diagnosis not present

## 2022-12-07 DIAGNOSIS — J449 Chronic obstructive pulmonary disease, unspecified: Secondary | ICD-10-CM | POA: Diagnosis present

## 2022-12-07 DIAGNOSIS — F250 Schizoaffective disorder, bipolar type: Secondary | ICD-10-CM | POA: Diagnosis present

## 2022-12-07 DIAGNOSIS — I10 Essential (primary) hypertension: Secondary | ICD-10-CM | POA: Diagnosis present

## 2022-12-07 DIAGNOSIS — Z89611 Acquired absence of right leg above knee: Secondary | ICD-10-CM

## 2022-12-07 DIAGNOSIS — K5900 Constipation, unspecified: Secondary | ICD-10-CM | POA: Diagnosis not present

## 2022-12-07 DIAGNOSIS — K0889 Other specified disorders of teeth and supporting structures: Secondary | ICD-10-CM | POA: Diagnosis not present

## 2022-12-07 DIAGNOSIS — R52 Pain, unspecified: Secondary | ICD-10-CM

## 2022-12-07 DIAGNOSIS — L03116 Cellulitis of left lower limb: Secondary | ICD-10-CM | POA: Diagnosis not present

## 2022-12-07 DIAGNOSIS — B370 Candidal stomatitis: Secondary | ICD-10-CM | POA: Diagnosis not present

## 2022-12-07 DIAGNOSIS — K047 Periapical abscess without sinus: Secondary | ICD-10-CM | POA: Diagnosis not present

## 2022-12-07 DIAGNOSIS — G8929 Other chronic pain: Secondary | ICD-10-CM | POA: Diagnosis present

## 2022-12-07 DIAGNOSIS — K219 Gastro-esophageal reflux disease without esophagitis: Secondary | ICD-10-CM | POA: Diagnosis present

## 2022-12-07 DIAGNOSIS — Z7901 Long term (current) use of anticoagulants: Secondary | ICD-10-CM

## 2022-12-07 DIAGNOSIS — Z5900 Homelessness unspecified: Secondary | ICD-10-CM

## 2022-12-07 DIAGNOSIS — A419 Sepsis, unspecified organism: Principal | ICD-10-CM | POA: Diagnosis present

## 2022-12-07 DIAGNOSIS — K068 Other specified disorders of gingiva and edentulous alveolar ridge: Secondary | ICD-10-CM | POA: Diagnosis present

## 2022-12-07 DIAGNOSIS — Z993 Dependence on wheelchair: Secondary | ICD-10-CM

## 2022-12-07 DIAGNOSIS — F17200 Nicotine dependence, unspecified, uncomplicated: Secondary | ICD-10-CM | POA: Diagnosis present

## 2022-12-07 DIAGNOSIS — E871 Hypo-osmolality and hyponatremia: Secondary | ICD-10-CM | POA: Diagnosis present

## 2022-12-07 DIAGNOSIS — E1142 Type 2 diabetes mellitus with diabetic polyneuropathy: Secondary | ICD-10-CM | POA: Diagnosis present

## 2022-12-07 DIAGNOSIS — E785 Hyperlipidemia, unspecified: Secondary | ICD-10-CM | POA: Diagnosis present

## 2022-12-07 DIAGNOSIS — E1152 Type 2 diabetes mellitus with diabetic peripheral angiopathy with gangrene: Secondary | ICD-10-CM | POA: Diagnosis present

## 2022-12-07 DIAGNOSIS — I878 Other specified disorders of veins: Secondary | ICD-10-CM | POA: Diagnosis present

## 2022-12-07 DIAGNOSIS — F1010 Alcohol abuse, uncomplicated: Secondary | ICD-10-CM | POA: Diagnosis present

## 2022-12-07 DIAGNOSIS — I5043 Acute on chronic combined systolic (congestive) and diastolic (congestive) heart failure: Secondary | ICD-10-CM | POA: Diagnosis present

## 2022-12-07 DIAGNOSIS — Z5982 Transportation insecurity: Secondary | ICD-10-CM

## 2022-12-07 DIAGNOSIS — I25119 Atherosclerotic heart disease of native coronary artery with unspecified angina pectoris: Secondary | ICD-10-CM | POA: Diagnosis present

## 2022-12-07 DIAGNOSIS — F1210 Cannabis abuse, uncomplicated: Secondary | ICD-10-CM | POA: Diagnosis present

## 2022-12-07 DIAGNOSIS — Z91199 Patient's noncompliance with other medical treatment and regimen due to unspecified reason: Secondary | ICD-10-CM

## 2022-12-07 DIAGNOSIS — Z86711 Personal history of pulmonary embolism: Secondary | ICD-10-CM

## 2022-12-07 DIAGNOSIS — I110 Hypertensive heart disease with heart failure: Secondary | ICD-10-CM | POA: Diagnosis present

## 2022-12-07 DIAGNOSIS — Z5941 Food insecurity: Secondary | ICD-10-CM

## 2022-12-07 DIAGNOSIS — L97529 Non-pressure chronic ulcer of other part of left foot with unspecified severity: Secondary | ICD-10-CM | POA: Diagnosis present

## 2022-12-07 DIAGNOSIS — F419 Anxiety disorder, unspecified: Secondary | ICD-10-CM | POA: Diagnosis present

## 2022-12-07 DIAGNOSIS — K083 Retained dental root: Secondary | ICD-10-CM | POA: Diagnosis present

## 2022-12-07 DIAGNOSIS — Z813 Family history of other psychoactive substance abuse and dependence: Secondary | ICD-10-CM

## 2022-12-07 DIAGNOSIS — E119 Type 2 diabetes mellitus without complications: Secondary | ICD-10-CM

## 2022-12-07 DIAGNOSIS — L0390 Cellulitis, unspecified: Secondary | ICD-10-CM | POA: Diagnosis not present

## 2022-12-07 DIAGNOSIS — R9431 Abnormal electrocardiogram [ECG] [EKG]: Secondary | ICD-10-CM | POA: Diagnosis present

## 2022-12-07 DIAGNOSIS — K122 Cellulitis and abscess of mouth: Secondary | ICD-10-CM | POA: Diagnosis not present

## 2022-12-07 DIAGNOSIS — I509 Heart failure, unspecified: Secondary | ICD-10-CM | POA: Diagnosis not present

## 2022-12-07 DIAGNOSIS — H6121 Impacted cerumen, right ear: Secondary | ICD-10-CM | POA: Diagnosis present

## 2022-12-07 DIAGNOSIS — Z888 Allergy status to other drugs, medicaments and biological substances status: Secondary | ICD-10-CM

## 2022-12-07 DIAGNOSIS — Z91010 Allergy to peanuts: Secondary | ICD-10-CM

## 2022-12-07 DIAGNOSIS — Z79899 Other long term (current) drug therapy: Secondary | ICD-10-CM

## 2022-12-07 DIAGNOSIS — F32A Depression, unspecified: Secondary | ICD-10-CM | POA: Diagnosis present

## 2022-12-07 DIAGNOSIS — I5040 Unspecified combined systolic (congestive) and diastolic (congestive) heart failure: Secondary | ICD-10-CM | POA: Diagnosis not present

## 2022-12-07 DIAGNOSIS — F1410 Cocaine abuse, uncomplicated: Secondary | ICD-10-CM | POA: Diagnosis present

## 2022-12-07 DIAGNOSIS — F17210 Nicotine dependence, cigarettes, uncomplicated: Secondary | ICD-10-CM | POA: Diagnosis present

## 2022-12-07 DIAGNOSIS — I2510 Atherosclerotic heart disease of native coronary artery without angina pectoris: Secondary | ICD-10-CM | POA: Diagnosis not present

## 2022-12-07 DIAGNOSIS — Z86718 Personal history of other venous thrombosis and embolism: Secondary | ICD-10-CM

## 2022-12-07 DIAGNOSIS — S81809A Unspecified open wound, unspecified lower leg, initial encounter: Secondary | ICD-10-CM | POA: Diagnosis not present

## 2022-12-07 DIAGNOSIS — Z751 Person awaiting admission to adequate facility elsewhere: Secondary | ICD-10-CM

## 2022-12-07 DIAGNOSIS — K029 Dental caries, unspecified: Secondary | ICD-10-CM | POA: Diagnosis present

## 2022-12-07 DIAGNOSIS — F329 Major depressive disorder, single episode, unspecified: Secondary | ICD-10-CM | POA: Diagnosis present

## 2022-12-07 DIAGNOSIS — L02612 Cutaneous abscess of left foot: Secondary | ICD-10-CM | POA: Diagnosis present

## 2022-12-07 DIAGNOSIS — Z886 Allergy status to analgesic agent status: Secondary | ICD-10-CM

## 2022-12-07 DIAGNOSIS — Z6830 Body mass index (BMI) 30.0-30.9, adult: Secondary | ICD-10-CM

## 2022-12-07 LAB — BASIC METABOLIC PANEL
Anion gap: 10 (ref 5–15)
BUN: 18 mg/dL (ref 6–20)
CO2: 23 mmol/L (ref 22–32)
Calcium: 8.6 mg/dL — ABNORMAL LOW (ref 8.9–10.3)
Chloride: 100 mmol/L (ref 98–111)
Creatinine, Ser: 0.82 mg/dL (ref 0.61–1.24)
GFR, Estimated: >60 mL/min (ref >60)
Glucose, Bld: 80 mg/dL (ref 70–99)
Potassium: 3.6 mmol/L (ref 3.5–5.1)
Sodium: 133 mmol/L — ABNORMAL LOW (ref 135–145)

## 2022-12-07 LAB — CBC WITH DIFFERENTIAL/PLATELET
Abs Immature Granulocytes: 0.04 10*3/uL (ref 0.00–0.07)
Basophils Absolute: 0 10*3/uL (ref 0.0–0.1)
Basophils Relative: 0 %
Eosinophils Absolute: 0 10*3/uL (ref 0.0–0.5)
Eosinophils Relative: 0 %
HCT: 43.4 % (ref 39.0–52.0)
Hemoglobin: 14.1 g/dL (ref 13.0–17.0)
Immature Granulocytes: 0 %
Lymphocytes Relative: 15 %
Lymphs Abs: 1.7 10*3/uL (ref 0.7–4.0)
MCH: 30.4 pg (ref 26.0–34.0)
MCHC: 32.5 g/dL (ref 30.0–36.0)
MCV: 93.5 fL (ref 80.0–100.0)
Monocytes Absolute: 0.5 10*3/uL (ref 0.1–1.0)
Monocytes Relative: 4 %
Neutro Abs: 8.9 10*3/uL — ABNORMAL HIGH (ref 1.7–7.7)
Neutrophils Relative %: 81 %
Platelets: 385 10*3/uL (ref 150–400)
RBC: 4.64 MIL/uL (ref 4.22–5.81)
RDW: 16.2 % — ABNORMAL HIGH (ref 11.5–15.5)
WBC: 11.2 10*3/uL — ABNORMAL HIGH (ref 4.0–10.5)
nRBC: 0 % (ref 0.0–0.2)

## 2022-12-07 LAB — CBG MONITORING, ED: Glucose-Capillary: 101 mg/dL — ABNORMAL HIGH (ref 70–99)

## 2022-12-07 LAB — BRAIN NATRIURETIC PEPTIDE: B Natriuretic Peptide: 189.5 pg/mL — ABNORMAL HIGH (ref 0.0–100.0)

## 2022-12-07 LAB — TROPONIN I (HIGH SENSITIVITY)
Troponin I (High Sensitivity): 31 ng/L — ABNORMAL HIGH (ref <18)
Troponin I (High Sensitivity): 44 ng/L — ABNORMAL HIGH (ref <18)

## 2022-12-07 LAB — LACTIC ACID, PLASMA
Lactic Acid, Venous: 1 mmol/L (ref 0.5–1.9)
Lactic Acid, Venous: 1.7 mmol/L (ref 0.5–1.9)

## 2022-12-07 MED ORDER — LOSARTAN POTASSIUM 25 MG PO TABS
25.0000 mg | ORAL_TABLET | Freq: Every day | ORAL | Status: DC
  Administered 2022-12-08: 25 mg via ORAL
  Filled 2022-12-07: qty 1

## 2022-12-07 MED ORDER — THIAMINE MONONITRATE 100 MG PO TABS
100.0000 mg | ORAL_TABLET | Freq: Every day | ORAL | Status: DC
  Administered 2022-12-08 – 2023-04-28 (×120): 100 mg via ORAL
  Filled 2022-12-07 (×131): qty 1

## 2022-12-07 MED ORDER — CEFAZOLIN SODIUM-DEXTROSE 1-4 GM/50ML-% IV SOLN: 1.0000 g | Freq: Once | INTRAVENOUS | Status: DC

## 2022-12-07 MED ORDER — MORPHINE SULFATE (PF) 4 MG/ML IV SOLN
4.0000 mg | Freq: Once | INTRAVENOUS | Status: AC
  Administered 2022-12-07: 4 mg via INTRAVENOUS
  Filled 2022-12-07: qty 1

## 2022-12-07 MED ORDER — METRONIDAZOLE 500 MG/100ML IV SOLN
500.0000 mg | Freq: Two times a day (BID) | INTRAVENOUS | Status: AC
  Administered 2022-12-08 – 2022-12-11 (×7): 500 mg via INTRAVENOUS
  Filled 2022-12-07 (×7): qty 100

## 2022-12-07 MED ORDER — IOHEXOL 350 MG/ML SOLN
100.0000 mL | Freq: Once | INTRAVENOUS | Status: AC | PRN
  Administered 2022-12-07: 100 mL via INTRAVENOUS

## 2022-12-07 MED ORDER — GABAPENTIN 300 MG PO CAPS
900.0000 mg | ORAL_CAPSULE | Freq: Three times a day (TID) | ORAL | Status: DC
  Administered 2022-12-07 – 2023-06-13 (×532): 900 mg via ORAL
  Filled 2022-12-07 (×131): qty 3
  Filled 2022-12-07: qty 9
  Filled 2022-12-07 (×422): qty 3

## 2022-12-07 MED ORDER — LORAZEPAM 1 MG PO TABS
1.0000 mg | ORAL_TABLET | ORAL | Status: AC | PRN
  Administered 2022-12-08: 1 mg via ORAL
  Administered 2022-12-09: 2 mg via ORAL
  Administered 2022-12-09 (×2): 1 mg via ORAL
  Filled 2022-12-07: qty 2
  Filled 2022-12-07 (×4): qty 1

## 2022-12-07 MED ORDER — ALBUTEROL SULFATE HFA 108 (90 BASE) MCG/ACT IN AERS: 2.0000 | INHALATION_SPRAY | Freq: Four times a day (QID) | RESPIRATORY_TRACT | Status: DC | PRN

## 2022-12-07 MED ORDER — HYDROCERIN EX CREA
TOPICAL_CREAM | Freq: Every day | CUTANEOUS | Status: AC
  Filled 2022-12-07 (×4): qty 113

## 2022-12-07 MED ORDER — ADULT MULTIVITAMIN W/MINERALS CH
1.0000 | ORAL_TABLET | Freq: Every day | ORAL | Status: DC
  Administered 2022-12-08 – 2023-05-23 (×160): 1 via ORAL
  Filled 2022-12-07 (×176): qty 1

## 2022-12-07 MED ORDER — SODIUM CHLORIDE 0.9 % IV BOLUS
500.0000 mL | Freq: Once | INTRAVENOUS | Status: AC
  Administered 2022-12-07: 500 mL via INTRAVENOUS

## 2022-12-07 MED ORDER — METRONIDAZOLE 500 MG/100ML IV SOLN: 500.0000 mg | Freq: Two times a day (BID) | INTRAVENOUS | Status: DC

## 2022-12-07 MED ORDER — FOLIC ACID 1 MG PO TABS
1.0000 mg | ORAL_TABLET | Freq: Every day | ORAL | Status: DC
  Administered 2022-12-12 – 2023-02-07 (×22): 1 mg via ORAL
  Filled 2022-12-07 (×44): qty 1

## 2022-12-07 MED ORDER — ALBUTEROL SULFATE (2.5 MG/3ML) 0.083% IN NEBU
2.5000 mg | INHALATION_SOLUTION | Freq: Four times a day (QID) | RESPIRATORY_TRACT | Status: DC | PRN
  Administered 2022-12-10 – 2023-01-06 (×10): 2.5 mg via RESPIRATORY_TRACT
  Filled 2022-12-07 (×11): qty 3

## 2022-12-07 MED ORDER — LORAZEPAM 2 MG/ML IJ SOLN
1.0000 mg | INTRAMUSCULAR | Status: AC | PRN
  Administered 2022-12-10: 1 mg via INTRAVENOUS
  Filled 2022-12-07: qty 1

## 2022-12-07 MED ORDER — LACTATED RINGERS IV SOLN: INTRAVENOUS | Status: DC

## 2022-12-07 MED ORDER — MORPHINE SULFATE (PF) 4 MG/ML IV SOLN
4.0000 mg | INTRAVENOUS | Status: DC | PRN
  Administered 2022-12-07 – 2022-12-13 (×9): 4 mg via INTRAVENOUS
  Filled 2022-12-07 (×9): qty 1

## 2022-12-07 MED ORDER — APIXABAN 5 MG PO TABS
5.0000 mg | ORAL_TABLET | Freq: Two times a day (BID) | ORAL | Status: DC
  Administered 2022-12-07 – 2022-12-09 (×4): 5 mg via ORAL
  Filled 2022-12-07 (×4): qty 1

## 2022-12-07 MED ORDER — MORPHINE SULFATE (PF) 2 MG/ML IV SOLN
2.0000 mg | Freq: Once | INTRAVENOUS | Status: AC
  Administered 2022-12-07: 2 mg via INTRAVENOUS
  Filled 2022-12-07: qty 1

## 2022-12-07 MED ORDER — ESCITALOPRAM OXALATE 10 MG PO TABS: 5.0000 mg | ORAL_TABLET | Freq: Every day | ORAL | Status: DC

## 2022-12-07 MED ORDER — AMLODIPINE BESYLATE 10 MG PO TABS
10.0000 mg | ORAL_TABLET | Freq: Every day | ORAL | Status: DC
  Administered 2022-12-08 – 2023-06-16 (×188): 10 mg via ORAL
  Filled 2022-12-07 (×191): qty 1

## 2022-12-07 MED ORDER — METRONIDAZOLE 500 MG/100ML IV SOLN
500.0000 mg | Freq: Once | INTRAVENOUS | Status: AC
  Administered 2022-12-07: 500 mg via INTRAVENOUS
  Filled 2022-12-07: qty 100

## 2022-12-07 MED ORDER — KETOROLAC TROMETHAMINE 15 MG/ML IJ SOLN: 15.0000 mg | Freq: Once | INTRAMUSCULAR | Status: AC | PRN

## 2022-12-07 MED ORDER — SODIUM CHLORIDE 0.9 % IV SOLN
2.0000 g | Freq: Once | INTRAVENOUS | Status: AC
  Administered 2022-12-07: 2 g via INTRAVENOUS
  Filled 2022-12-07: qty 12.5

## 2022-12-07 MED ORDER — OXYCODONE HCL 5 MG PO TABS
5.0000 mg | ORAL_TABLET | ORAL | Status: DC | PRN
  Administered 2022-12-08 – 2022-12-10 (×9): 5 mg via ORAL
  Filled 2022-12-07 (×9): qty 1

## 2022-12-07 MED ORDER — LORAZEPAM 1 MG PO TABS
1.0000 mg | ORAL_TABLET | Freq: Once | ORAL | Status: AC
  Administered 2022-12-07: 1 mg via ORAL
  Filled 2022-12-07: qty 1

## 2022-12-07 MED ORDER — VANCOMYCIN HCL 1500 MG/300ML IV SOLN
1500.0000 mg | Freq: Once | INTRAVENOUS | Status: AC
  Administered 2022-12-07: 1500 mg via INTRAVENOUS
  Filled 2022-12-07: qty 300

## 2022-12-07 MED ORDER — VANCOMYCIN HCL 1500 MG/300ML IV SOLN
1500.0000 mg | Freq: Two times a day (BID) | INTRAVENOUS | Status: AC
  Administered 2022-12-08 – 2022-12-11 (×7): 1500 mg via INTRAVENOUS
  Filled 2022-12-07 (×7): qty 300

## 2022-12-07 MED ORDER — ISOSORBIDE MONONITRATE ER 30 MG PO TB24
15.0000 mg | ORAL_TABLET | Freq: Every day | ORAL | Status: DC
  Administered 2022-12-08 – 2023-06-04 (×178): 15 mg via ORAL
  Filled 2022-12-07 (×180): qty 1

## 2022-12-07 MED ORDER — SODIUM CHLORIDE 0.9 % IV SOLN
2.0000 g | Freq: Three times a day (TID) | INTRAVENOUS | Status: AC
  Administered 2022-12-07 – 2022-12-11 (×10): 2 g via INTRAVENOUS
  Filled 2022-12-07 (×10): qty 12.5

## 2022-12-07 NOTE — ED Triage Notes (Signed)
Per EMS- Patient is homeless. Patient reports that he has been taking antibiotics for a leg infection. Patient c/o swelling and pain of the left leg. Patient denies any injuries.

## 2022-12-07 NOTE — Consult Note (Signed)
WOC Nurse Consult Note: Reason for Consult:chronic, nonhealing full thickness wound to LLE. During last admission (11/25/22) I saw this patient and Podiatry saw the same day and endorsed orders that I had placed. Dr. Sharol Given has also seen patient in the past and recommended SNF placement so that wound care could be performed more regularly. Wound type:venous insufficiency. Pressure Injury POA: Yes/No/NA Measurement:Bedside RN to measure and document with next dressing application. Wound bed:red, moist Drainage (amount, consistency, odor) moderate, serous to serosanguinous  Periwound: dry Dressing procedure/placement/frequency: I have provided Nursing with guidance for the topical care of these wounds, using a daily soap and water cleanse, rinse and dry. This is to be followed by application of Eucerin cream to the LLE, but Eucerin is not to be applied over wounds or between digits. Topical care to the wounds will be with a silver hydrofiber (Aquacel Ag+ Advantage, Kellie Simmering [phone]) topped with ABD pads for comfort. The dressings are to be secured using a Kerlix roll gauze applied from just below toes to just below knee. A 6-inch ACE bandage is to be applied over the Kerlix for mild compression. LEs are to be elevated while in bed.  A sacral silicone foam is to be placed prophylactically.   If desired, recommend consideration of consult with either Dr. Sharol Given (Orthopedics) or with Podiatric Medicine.  [facility] nursing team will not follow, but will remain available to this patient, the nursing and medical teams.  Please re-consult if needed.  Thank you for inviting [place] to participate in this patient's Plan of Care.  Maudie Flakes, MSN, RN, CNS, [place], Serita Grammes, [employer], [employer] phone:  [phone]

## 2022-12-07 NOTE — H&P (Signed)
History and Physical    Patient: [name] ZOX:[id number] DOB: 06/09/1966 DOA: 12/07/2022 DOS: the patient was seen and examined on 12/07/2022 PCP: [name], NP  Patient coming from: Homeless  Chief Complaint:  Chief Complaint  Patient presents with   Leg Swelling   Leg Pain   HPI: [name] is a 57 y.o. male with medical history significant of chronic combined HF, CAD, HLD, polyneuropathy, bipolar d/o, COPD. Presenting with left ankle pain. He was recently admitted for left ankle wound infection. At the time he was seen by podiatry who recommended that he continue augmentin and outpt wound care. After stabilizing, he was discharged on augmentin. He reports compliance with the medication since discharge. However, he has had increasing pain and edema since leaving. He has not had any fevers. He reports that he has tried to maintain cleanliness, but it is hard as he is homeless. When his symptoms did not improve today, he decided to come back to the ED for evaluation. He denies any other aggravating or alleviating factors.       Review of Systems: As mentioned in the history of present illness. All other systems reviewed and are negative. Past Medical History:  Diagnosis Date   Asthma    Bipolar 1 disorder (HCC)    CHF (congestive heart failure) (HCC)    COPD (chronic obstructive pulmonary disease) (HCC)    Depression    Diabetes mellitus without complication (HCC)    GERD (gastroesophageal reflux disease)    Homelessness    Hypertension    QT prolongation    Schizoaffective disorder [facility])    Past Surgical History:  Procedure Laterality Date   AMPUTATION TOE Bilateral 01/02/2021   Procedure: AMPUTATION TOE;  Surgeon: Park Liter, DPM;  Location: [facility] OR;  Service: Podiatry;  Laterality: Bilateral;  Ok to leave patient on hospital bed   IR ANGIO INTRA EXTRACRAN SEL COM CAROTID INNOMINATE BILAT MOD SED  12/17/2020   IR ANGIO VERTEBRAL SEL VERTEBRAL BILAT MOD SED   12/17/2020   RADIOLOGY WITH ANESTHESIA N/A 12/17/2020   Procedure: IR WITH ANESTHESIA;  Surgeon: Radiologist, Medication, MD;  Location: [facility] OR;  Service: Radiology;  Laterality: N/A;   Social History:  reports that he has been smoking cigarettes. He has never used smokeless tobacco. He reports current alcohol use. He reports current drug use. Drug: Cocaine.  Allergies  Allergen Reactions   Motrin [Ibuprofen] Anaphylaxis   Tylenol [Acetaminophen] Hives, Nausea And Vomiting and Rash   Lyrica Cr [Pregabalin Er] Other (See Comments)    Dystonia Dysarthria    Peanut-Containing Drug Products Other (See Comments)    Pt denies allergy to peanut butter 03/02/2021    Family History  Problem Relation Age of Onset   Drug abuse Mother    Suicidality Mother     Prior to Admission medications   Medication Sig Start Date End Date Taking? Authorizing Provider  albuterol (VENTOLIN HFA) 108 (90 Base) MCG/ACT inhaler Inhale 2 puffs into the lungs every 6 (six) hours as needed for wheezing or shortness of breath. 07/25/22  Yes Danford, Earl Lites, MD  amLODipine (NORVASC) 10 MG tablet Take 1 tablet (10 mg total) by mouth daily. 10/10/22  Yes Rhetta Mura, MD  amoxicillin-clavulanate (AUGMENTIN) 500-125 MG tablet Take 1 tablet by mouth 3 (three) times daily for 7 days. 12/03/22 12/10/22 Yes Shah, Pratik D, DO  apixaban (ELIQUIS) 5 MG TABS tablet Take 1 tablet (5 mg total) by mouth 2 (two) times daily. 10/09/22  Yes Rhetta Mura, MD  ARIPiprazole (ABILIFY) 10 MG tablet Take 1 tablet (10 mg total) by mouth daily. 10/10/22  Yes Rhetta Mura, MD  escitalopram (LEXAPRO) 5 MG tablet Take 1 tablet (5 mg total) by mouth daily. 10/10/22  Yes Rhetta Mura, MD  folic acid (FOLVITE) 1 MG tablet Take 1 tablet (1 mg total) by mouth daily. 12/03/22 01/02/23 Yes Shah, Pratik D, DO  gabapentin (NEURONTIN) 300 MG capsule Take 3 capsules (900 mg total) by mouth 3 (three) times daily. 12/03/22 01/02/23 Yes  Shah, Pratik D, DO  isosorbide mononitrate (IMDUR) 30 MG 24 hr tablet Take 0.5 tablets (15 mg total) by mouth daily. 12/03/22 01/02/23 Yes Shah, Pratik D, DO  losartan (COZAAR) 25 MG tablet Take 1 tablet (25 mg total) by mouth daily. 12/03/22 01/02/23 Yes Shah, Pratik D, DO  oxyCODONE (OXY IR/ROXICODONE) 5 MG immediate release tablet Take 1-2 tablets (5-10 mg total) by mouth every 4 (four) hours as needed for moderate pain. 12/03/22  Yes Shah, Pratik D, DO  thiamine (VITAMIN B-1) 100 MG tablet Take 1 tablet (100 mg total) by mouth daily. 12/03/22 01/02/23 Yes Shah, Pratik D, DO  traZODone (DESYREL) 50 MG tablet Take 1 tablet (50 mg total) by mouth at bedtime as needed for sleep. 10/09/22  Yes Rhetta Mura, MD    Physical Exam: Vitals:   12/07/22 1135 12/07/22 1200 12/07/22 1315 12/07/22 1450  BP:  132/86 111/66 (!) 127/104  Pulse:  (!) 117  (!) 130  Resp: (!) 22 (!) 23 (!) 22 19  Temp: 98.4 F (36.9 C)     TempSrc: Oral     SpO2:  100%  99%  Weight:      Height:       General: 57 y.o. male resting in bed in NAD Eyes: PERRL, normal sclera ENMT: Nares patent w/o discharge, orophaynx clear, dentition normal, ears w/o discharge/lesions/ulcers Neck: Supple, trachea midline Cardiovascular: tachy, +S1, S2, no m/g/r, equal pulses throughout Respiratory: CTABL, no w/r/r, normal WOB GI: BS+, NDNT, no masses noted, no organomegaly noted MSK: No c/c; right BKA, erythema/edema of left lateral and medial ankle and TTP  Neuro: A&O x 3, no focal deficits Psyc: Appropriate interaction and affect, calm/cooperative  Data Reviewed:  Results for orders placed or performed during the hospital encounter of 12/07/22 (from the past 24 hour(s))  Basic metabolic panel     Status: Abnormal   Collection Time: 12/07/22  9:20 AM  Result Value Ref Range   Sodium 133 (L) 135 - 145 mmol/L   Potassium 3.6 3.5 - 5.1 mmol/L   Chloride 100 98 - 111 mmol/L   CO2 23 22 - 32 mmol/L   Glucose, Bld 80 70 - 99 mg/dL   BUN  18 6 - 20 mg/dL   Creatinine, Ser 7.61 0.61 - 1.24 mg/dL   Calcium 8.6 (L) 8.9 - 10.3 mg/dL   GFR, Estimated >60 >73 mL/min   Anion gap 10 5 - 15  CBC with Differential     Status: Abnormal   Collection Time: 12/07/22  9:20 AM  Result Value Ref Range   WBC 11.2 (H) 4.0 - 10.5 K/uL   RBC 4.64 4.22 - 5.81 MIL/uL   Hemoglobin 14.1 13.0 - 17.0 g/dL   HCT 71.0 62.6 - 94.8 %   MCV 93.5 80.0 - 100.0 fL   MCH 30.4 26.0 - 34.0 pg   MCHC 32.5 30.0 - 36.0 g/dL   RDW 54.6 (H) 27.0 - 35.0 %   Platelets  385 150 - 400 K/uL   nRBC 0.0 0.0 - 0.2 %   Neutrophils Relative % 81 %   Neutro Abs 8.9 (H) 1.7 - 7.7 K/uL   Lymphocytes Relative 15 %   Lymphs Abs 1.7 0.7 - 4.0 K/uL   Monocytes Relative 4 %   Monocytes Absolute 0.5 0.1 - 1.0 K/uL   Eosinophils Relative 0 %   Eosinophils Absolute 0.0 0.0 - 0.5 K/uL   Basophils Relative 0 %   Basophils Absolute 0.0 0.0 - 0.1 K/uL   Immature Granulocytes 0 %   Abs Immature Granulocytes 0.04 0.00 - 0.07 K/uL  Troponin I (High Sensitivity)     Status: Abnormal   Collection Time: 12/07/22  9:20 AM  Result Value Ref Range   Troponin I (High Sensitivity) 31 (H) <18 ng/L  Brain natriuretic peptide     Status: Abnormal   Collection Time: 12/07/22  9:20 AM  Result Value Ref Range   B Natriuretic Peptide 189.5 (H) 0.0 - 100.0 pg/mL  CBG monitoring, ED     Status: Abnormal   Collection Time: 12/07/22  1:54 PM  Result Value Ref Range   Glucose-Capillary 101 (H) 70 - 99 mg/dL  Troponin I (High Sensitivity)     Status: Abnormal   Collection Time: 12/07/22  2:45 PM  Result Value Ref Range   Troponin I (High Sensitivity) 44 (H) <18 ng/L   XR Left Ankle 1. No acute bony findings. 2. No soft tissue gas. 3. MRI with and without contrast would be more sensitive means to evaluate for osteomyelitis, as clinically warranted.  XR Left Foot Negative.   CTA Chest PE 1. Preferential opacification of the pulmonary arteries to the segmental levels. No evidence of  central pulmonary embolism. Evaluation of distal segmental and subsegmental pulmonary arteries is limited due to mixing artifact and respiratory motion. 2. Small scattered opacities in the dependent portions of the right upper, right middle lobes likely representing atelectasis sequela of prior infectious/inflammatory process. No evidence of acute pneumonia or large pleural effusion. 3. Aortic atherosclerosis.  Assessment and Plan: Cellulitis of left ankle Left ankle would infection Sepsis     - admit to inpt, progressive     - follow lactic acid, blood cultures     - XR w/o evidence of osteo     - broad spec abx w/ vanc, cefepime, flagyl for now     - fluids (gently given his HF history), pain control     - WOCN  Chronic combined HF HTN     - continue home regimen as tolerated     - I&O, daily weights  Bipolar d/o     - continue home regimen  EtOH abuse Cocaine abuse Tobacco abuse     - counsel against further use     - CIWA  COPD     - continue home regimen  DM2     - diet controlled     - check A1c  Hx of DVT/PE     - continue home regimen  Advance Care Planning:   Code Status: FULL  Consults: None  Family Communication: None at bedside  Severity of Illness: The appropriate patient status for this patient is INPATIENT. Inpatient status is judged to be reasonable and necessary in order to provide the required intensity of service to ensure the patient's safety. The patient's presenting symptoms, physical exam findings, and initial radiographic and laboratory data in the context of their chronic comorbidities is felt to place  them at high risk for further clinical deterioration. Furthermore, it is not anticipated that the patient will be medically stable for discharge from the hospital within 2 midnights of admission.   * I certify that at the point of admission it is my clinical judgment that the patient will require inpatient hospital care spanning beyond 2  midnights from the point of admission due to high intensity of service, high risk for further deterioration and high frequency of surveillance required.*  Author: Jonnie Finner, DO 12/07/2022 3:35 PM  For on call review [email or web address].

## 2022-12-07 NOTE — Progress Notes (Signed)
A consult was received from an ED physician for vancomycin and cefepime per pharmacy dosing.  The patient's profile has been reviewed for ht/wt/allergies/indication/available labs.   A one time order has been placed for Cefepime 2g and Vancomycin 1500mg .    Further antibiotics/pharmacy consults should be ordered by admitting physician if indicated.                       Thank you,  Gretta Arab PharmD, BCPS [facility] main pharmacy [phone] 12/07/2022 3:14 PM

## 2022-12-07 NOTE — ED Notes (Signed)
Xeroform, non-adherent, kerlex, and ace bandage applied to L foot.

## 2022-12-07 NOTE — Progress Notes (Signed)
Lower extremity venous left study completed.  Preliminary results relayed to Billy Fischer, MD.   See CV Proc for preliminary results report.   Darlin Coco, RDMS, RVT

## 2022-12-07 NOTE — Progress Notes (Signed)
Pharmacy Antibiotic Note  [name] is a 57 y.o. male admitted on 12/07/2022 with sepsis.  Pharmacy has been consulted for sepsis, wound infection dosing.  Active Problem(s): Readmit 1/7 with leg pain and swelling. Scoring tool copied from recent visit (discharged 1/3)  PMH: PMH: asthma/COPD, bipolar 1 disorder, depression, schizoaffective disorder, homelessness, GERD, hypertension, QT prolongation, type 2 diabetes, alcohol abuse, cocaine and cannabis abuse, tobacco use, chronic combined systolic and diastolic heart failure, right AKA   ID: foot wound,  Open wounds involving multiple regions of lower extremity. Has R BKA. - 12/24 left ankle/foot xray: no evidence of osteo - Tmax 100.2, WBC 11.2  12/25 Augmentin >> 12/26 Vanco 1/7>> Cefepime 1/7>> Flagyl 1/7>>   Plan: Cefepime 2g IV q8hr Flagyl 500mg  IV q12 hrs. Vanco 1500mg  IV x 1 then Vancomycin 1500 mg IV Q 12 hrs. Goal AUC 400-550. Expected AUC: 537 SCr used: 0.82   Height: 5\' 9"  (175.3 cm) Weight: 86.2 kg (190 lb) IBW/kg (Calculated) : 70.7  Temp (24hrs), Avg:98.7 F (37.1 C), Min:97.6 F (36.4 C), Max:100.2 F (37.9 C)  Recent Labs  Lab 12/01/22 0853 12/03/22 0555 12/07/22 0920  WBC 11.7* 12.5* 11.2*  CREATININE 1.02 0.90 0.82    Estimated Creatinine Clearance: 112 mL/min (by C-G formula based on SCr of 0.82 mg/dL).    Allergies  Allergen Reactions   Motrin [Ibuprofen] Anaphylaxis   Tylenol [Acetaminophen] Hives, Nausea And Vomiting and Rash   Lyrica Cr [Pregabalin Er] Other (See Comments)    Dystonia Dysarthria    Peanut-Containing Drug Products Other (See Comments)    Pt denies allergy to peanut butter 03/02/2021   [name] [name], PharmD, BCPS Clinical Staff Pharmacist [email or web address]  [name] 12/07/2022 5:41 PM

## 2022-12-07 NOTE — ED Provider Notes (Signed)
[facility] DEPT Provider Note   CSN: [id number] Arrival date & time: 12/07/22  0741     History  Chief Complaint  Patient presents with   Leg Swelling   Leg Pain    [name] is a 57 y.o. male.  With past medical history of bipolar, CHF, COPD, diabetes, hypertension, schizoaffective, substance abuse, housing instability who presents to the emergency department with leg pain and swelling.  States that he has had wounds to his left toe and ankle that have gotten worse and started bleeding.  He states that he was seen previously at the hospital for this and placed on amoxicillin and is still taking this medication.  He states that since he was seen the pain has gotten significantly worse and the wounds have started bleeding which is different from previous.  He also states that he is having left leg swelling.  He is unclear if it was swollen when he was seen last or not.  He denies having fevers.  On chart review it appears that he was admitted from 11/23/2022 - 12/03/2022.  Appears he was seen by podiatry during his admission for having open wounds to his left lower extremity.  He had arterial Doppler studies that were normal and he was placed on oral Augmentin and to have outpatient wound care follow-up.   Leg Pain      Home Medications Prior to Admission medications   Medication Sig Start Date End Date Taking? Authorizing Provider  albuterol (VENTOLIN HFA) 108 (90 Base) MCG/ACT inhaler Inhale 2 puffs into the lungs every 6 (six) hours as needed for wheezing or shortness of breath. 07/25/22   Danford, Suann Larry, MD  amLODipine (NORVASC) 10 MG tablet Take 1 tablet (10 mg total) by mouth daily. 10/10/22   Nita Sells, MD  amoxicillin-clavulanate (AUGMENTIN) 500-125 MG tablet Take 1 tablet by mouth 3 (three) times daily for 7 days. 12/03/22 12/10/22  Manuella Ghazi, Pratik D, DO  apixaban (ELIQUIS) 5 MG TABS tablet Take 1 tablet (5 mg total) by mouth 2  (two) times daily. 10/09/22   Nita Sells, MD  ARIPiprazole (ABILIFY) 10 MG tablet Take 1 tablet (10 mg total) by mouth daily. 10/10/22   Nita Sells, MD  escitalopram (LEXAPRO) 5 MG tablet Take 1 tablet (5 mg total) by mouth daily. 10/10/22   Nita Sells, MD  folic acid (FOLVITE) 1 MG tablet Take 1 tablet (1 mg total) by mouth daily. 12/03/22 01/02/23  Manuella Ghazi, Pratik D, DO  gabapentin (NEURONTIN) 300 MG capsule Take 3 capsules (900 mg total) by mouth 3 (three) times daily. 12/03/22 01/02/23  Manuella Ghazi, Pratik D, DO  isosorbide mononitrate (IMDUR) 30 MG 24 hr tablet Take 0.5 tablets (15 mg total) by mouth daily. 12/03/22 01/02/23  Manuella Ghazi, Pratik D, DO  losartan (COZAAR) 25 MG tablet Take 1 tablet (25 mg total) by mouth daily. 12/03/22 01/02/23  Manuella Ghazi, Pratik D, DO  oxyCODONE (OXY IR/ROXICODONE) 5 MG immediate release tablet Take 1-2 tablets (5-10 mg total) by mouth every 4 (four) hours as needed for moderate pain. 12/03/22   Manuella Ghazi, Pratik D, DO  thiamine (VITAMIN B-1) 100 MG tablet Take 1 tablet (100 mg total) by mouth daily. 12/03/22 01/02/23  Manuella Ghazi, Pratik D, DO  traZODone (DESYREL) 50 MG tablet Take 1 tablet (50 mg total) by mouth at bedtime as needed for sleep. 10/09/22   Nita Sells, MD      Allergies    Motrin [ibuprofen], Tylenol [acetaminophen], Lyrica cr [pregabalin er], and Peanut-containing  drug products    Review of Systems   Review of Systems  Cardiovascular:  Positive for leg swelling.  Skin:  Positive for wound.  All other systems reviewed and are negative.   Physical Exam Updated Vital Signs BP (!) 127/104 (BP Location: Left Arm)   Pulse (!) 130   Temp 98.4 F (36.9 C) (Oral)   Resp 19   Ht 5\' 9"  (1.753 m)   Wt 86.2 kg   SpO2 99%   BMI 28.06 kg/m  Physical Exam Vitals and nursing note reviewed.  Constitutional:      General: He is in acute distress.     Appearance: He is not ill-appearing or toxic-appearing.  HENT:     Head: Normocephalic.  Eyes:     General:  No scleral icterus. Cardiovascular:     Rate and Rhythm: Normal rate and regular rhythm.  Pulmonary:     Effort: Pulmonary effort is normal.     Breath sounds: Normal breath sounds.  Abdominal:     General: Bowel sounds are normal.     Palpations: Abdomen is soft.  Skin:    Comments: Left lower extremity is grossly swollen from the knee down.  There are two wounds to the medial and lateral ankle as well as the left 2nd digit. All are bleeding. Exquisitely tender to palpation   Neurological:     General: No focal deficit present.     Mental Status: He is alert.  Psychiatric:        Mood and Affect: Mood normal.        Behavior: Behavior normal.     ED Results / Procedures / Treatments   Labs (all labs ordered are listed, but only abnormal results are displayed) Labs Reviewed  BASIC METABOLIC PANEL - Abnormal; Notable for the following components:      Result Value   Sodium 133 (*)    Calcium 8.6 (*)    All other components within normal limits  CBC WITH DIFFERENTIAL/PLATELET - Abnormal; Notable for the following components:   WBC 11.2 (*)    RDW 16.2 (*)    Neutro Abs 8.9 (*)    All other components within normal limits  BRAIN NATRIURETIC PEPTIDE - Abnormal; Notable for the following components:   B Natriuretic Peptide 189.5 (*)    All other components within normal limits  CBG MONITORING, ED - Abnormal; Notable for the following components:   Glucose-Capillary 101 (*)    All other components within normal limits  TROPONIN I (HIGH SENSITIVITY) - Abnormal; Notable for the following components:   Troponin I (High Sensitivity) 31 (*)    All other components within normal limits  TROPONIN I (HIGH SENSITIVITY)    EKG None  Radiology VAS [place] LOWER EXTREMITY VENOUS (DVT) (ONLY [facility] & [facility])  Result Date: 12/07/2022  Lower Venous DVT Study Patient Name:  [name]  Date of Exam:   12/07/2022 Medical Rec #: [id number]       Accession #:    [id number] Date of Birth: 1966-08-03        Patient Gender: M Patient Age:   84 years Exam Location:  [facility] Procedure:      VAS [place] LOWER EXTREMITY VENOUS (DVT) Referring Phys: Mertha Baars --------------------------------------------------------------------------------  Indications: Pain, swelling, wound to lateral left ankle.  Limitations: Significant patient pain, positioning. Comparison Study: 08-19-2022 Prior left lower extremity venous was negative for  DVT.                    11-25-2022 Prior left lower extremity arterial study with no                   evidence of significant stenosis. Performing Technologist: Jean Rosenthal RDMS, RVT  Examination Guidelines: A complete evaluation includes B-mode imaging, spectral Doppler, color Doppler, and power Doppler as needed of all accessible portions of each vessel. Bilateral testing is considered an integral part of a complete examination. Limited examinations for reoccurring indications may be performed as noted. The reflux portion of the exam is performed with the patient in reverse Trendelenburg.  +---------+---------------+---------+-----------+----------+--------------+ LEFT     CompressibilityPhasicitySpontaneityPropertiesThrombus Aging +---------+---------------+---------+-----------+----------+--------------+ CFV      Full           Yes      Yes                                 +---------+---------------+---------+-----------+----------+--------------+ SFJ      Full                                                        +---------+---------------+---------+-----------+----------+--------------+ FV Prox  Full                                                        +---------+---------------+---------+-----------+----------+--------------+ FV Mid   Full                                                        +---------+---------------+---------+-----------+----------+--------------+ FV DistalFull                                                         +---------+---------------+---------+-----------+----------+--------------+ PFV      Full                                                        +---------+---------------+---------+-----------+----------+--------------+ POP      Full           Yes      Yes                                 +---------+---------------+---------+-----------+----------+--------------+ PTV      Full                                                        +---------+---------------+---------+-----------+----------+--------------+  PERO     Full                                                        +---------+---------------+---------+-----------+----------+--------------+     Summary: LEFT: - There is no evidence of deep vein thrombosis in the lower extremity.  - No cystic structure found in the popliteal fossa.  *See table(s) above for measurements and observations. Electronically signed by Gerarda Fraction on 12/07/2022 at 3:01:29 PM.    Final    CT Angio Chest PE W/Cm &/Or Wo Cm  Result Date: 12/07/2022 CLINICAL DATA:  Pulmonary embolism suspected. Leg swelling and leg pain. EXAM: CT ANGIOGRAPHY CHEST WITH CONTRAST TECHNIQUE: Multidetector CT imaging of the chest was performed using the standard protocol during bolus administration of intravenous contrast. Multiplanar CT image reconstructions and MIPs were obtained to evaluate the vascular anatomy. RADIATION DOSE REDUCTION: This exam was performed according to the departmental dose-optimization program which includes automated exposure control, adjustment of the mA and/or kV according to patient size and/or use of iterative reconstruction technique. CONTRAST:  OMNIPAQUE IOHEXOL 350 MG/ML SOLN COMPARISON:  CT angio dated November 24, 2022. FINDINGS: Cardiovascular: Preferential opacification of the pulmonary arteries to the segmental levels. No evidence of central pulmonary embolism. Evaluation of distal segmental and subsegmental pulmonary  arteries is limited due to mixing artifact and respiratory motion. Heart is normal in size. Atherosclerotic calcification of aorta and coronary vessels. No pericardial effusion. Mediastinum/Nodes: No enlarged mediastinal, hilar, or axillary lymph nodes. Thyroid gland, trachea, and esophagus demonstrate no significant findings. Lungs/Pleura: Small scattered opacities in the dependent portions of the right upper, right middle lobes likely representing atelectasis sequela of prior infectious/inflammatory process. Emphysematous changes. No evidence of acute pneumonia or large pleural effusion. Upper Abdomen: No acute abnormality. Musculoskeletal: No chest wall abnormality. No acute or significant osseous findings. Review of the MIP images confirms the above findings. IMPRESSION: 1. Preferential opacification of the pulmonary arteries to the segmental levels. No evidence of central pulmonary embolism. Evaluation of distal segmental and subsegmental pulmonary arteries is limited due to mixing artifact and respiratory motion. 2. Small scattered opacities in the dependent portions of the right upper, right middle lobes likely representing atelectasis sequela of prior infectious/inflammatory process. No evidence of acute pneumonia or large pleural effusion. 3. Aortic atherosclerosis. Aortic Atherosclerosis (ICD10-I70.0) and Emphysema (ICD10-J43.9). Electronically Signed   By: Larose Hires D.O.   On: 12/07/2022 14:28   DG Ankle Complete Left  Result Date: 12/07/2022 CLINICAL DATA:  Leg infection. Pain and swelling lateral aspect of lower leg. EXAM: LEFT ANKLE COMPLETE - 3+ VIEW COMPARISON:  11/23/2022 FINDINGS: No evidence for an acute fracture. No subluxation or dislocation. No discernible bony structure to suggest osteomyelitis. No soft tissue gas evident. IMPRESSION: 1. No acute bony findings. 2. No soft tissue gas. 3. MRI with and without contrast would be more sensitive means to evaluate for osteomyelitis, as  clinically warranted. Electronically Signed   By: Kennith Center M.D.   On: 12/07/2022 09:19   DG Foot Complete Left  Result Date: 12/07/2022 CLINICAL DATA:  Pain and swelling the lateral aspect of his lower leg. Soft tissue wound. 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 No evidence for an acute fracture  or dislocation. No evidence of bony destruction to suggest osteomyelitis IMPRESSION: Negative. Electronically Signed   By: Kennith Center M.D.   On: 12/07/2022 09:18    Procedures Procedures    Medications Ordered in ED Medications  sodium chloride 0.9 % bolus 500 mL (has no administration in time range)  metroNIDAZOLE (FLAGYL) IVPB 500 mg (has no administration in time range)  morphine (PF) 2 MG/ML injection 2 mg (2 mg Intravenous Given 12/07/22 0921)  morphine (PF) 4 MG/ML injection 4 mg (4 mg Intravenous Given 12/07/22 1109)  iohexol (OMNIPAQUE) 350 MG/ML injection 100 mL (100 mLs Intravenous Contrast Given 12/07/22 1320)  LORazepam (ATIVAN) tablet 1 mg (1 mg Oral Given 12/07/22 1259)    ED Course/ Medical Decision Making/ A&P Clinical Course as of 12/07/22 1513  Sun Dec 07, 2022  1155 Since presenting to ED patient HR increased to 110s. He does feel symptomatically short of breath over the past few days which he did not disclose on presentation. Having mild CP. States he has not missed any eliquis. Will obtain BNP, trop, CTA PE study given recent hx PE.  [LA]  1437 Consulted with radiology, Dr. Tasia Catchings who read CTA PE study - he does not feel there is acute PE at this time.  [LA]    Clinical Course User Index [LA] Cristopher Peru, PA-C                           Medical Decision Making Amount and/or Complexity of Data Reviewed Labs: ordered. Radiology: ordered.  Risk Prescription drug management. Decision regarding hospitalization.  Initial Impression and Ddx 57 year old male who presents to the  emergency department with left lower extremity wound and pain.  He is obvious pain with manipulation of his left lower leg.  The leg is grossly swollen from the knee down and hot.  The wound to the left lower extremity appears progressed since the last photos on 11/23/2022.  Will obtain plain films as well as a DVT study and provide pain medication.  Will also obtain labs to evaluate for overwhelming infection. Patient PMH that increases complexity of ED encounter: Polysubstance abuse, CHF, COPD, diabetes, hypertension  Interpretation of Diagnostics I independent reviewed and interpreted the labs as followed: CBC with mild leukocytosis of 11.2, BMP without severe electrolyte derangement.  Initial troponin 31, delta is pending.  BNP 189  - I independently visualized the following imaging with scope of interpretation limited to determining acute life threatening conditions related to emergency care: Ultrasound DVT study, which revealed negative, plain films without osteomyelitis, CT angio PE study which is negative  Patient Reassessment and Ultimate Disposition/Management On my initial reassessment the patient's pain is improving, his plain films are negative for osteomyelitis, DVT study is negative.  However the patient is now tachycardic.  He did not present tachycardic here to the emergency department.  He is now sustaining in the 110s.  I did ask if he was short of breath or having any chest pain and he endorses mild shortness of breath.  He has a history of DVT and PE on Eliquis.  He denies having missed any of his Eliquis doses.  Will go ahead and CTA PE study him as well as obtaining a BNP and troponin.  Redosed pain medications as well.  -The CT PE study is negative Initial troponin is 31, appears chronically elevated, will need delta to fully evaluate for ACS.  BNP is not significantly elevated at this  time.  Doubt heart failure.  -Additionally endorses alcohol use although not every day.  Given  1 mg of Ativan.  This also did not improve his symptoms or heart rate.  -On another reassessment, continues to be persistently tachycardic.  I had the attending, Dr. Dalene Seltzer evaluate the patient again with me.  He does endorse cocaine use but again this was not today and he does not feel to be in withdrawal.  She feels that likely has failed outpatient therapy on Augmentin.  The extremity, as noted above is swollen and warm.  Feels that he likely needs admission for IV antibiotics for cellulitis.  Do not feel that he is septic at this time.  I consulted and spoke with Dr. Ronaldo Miyamoto, hospitalist who agrees to admit the patient.  He request going out of started for broad-spectrum antibiotics such as vancomycin, cefepime and Flagyl.  I have also given him a 500 mL bolus of IV fluid.  Will be cautious with fluid given his EF is less than 20%.  Will need reevaluation prior to further fluid resuscitation.  Patient management required discussion with the following services or consulting groups:  Hospitalist Service  Complexity of Problems Addressed Acute complicated illness or Injury  Additional Data Reviewed and Analyzed Further history obtained from: Past medical history and medications listed in the EMR, Prior ED visit notes, Recent discharge summary, Care Everywhere, and Prior labs/imaging results  Patient Encounter Risk Assessment SDOH impact on management, Use of parenteral controlled substances, and Consideration of hospitalization  Final Clinical Impression(s) / ED Diagnoses Final diagnoses:  Cellulitis of left lower extremity    Rx / DC Orders ED Discharge Orders     None         Cristopher Peru, PA-C 12/07/22 1515    [name], MD 12/08/22 1435

## 2022-12-08 ENCOUNTER — Encounter (HOSPITAL_COMMUNITY): Payer: Self-pay

## 2022-12-08 DIAGNOSIS — L03116 Cellulitis of left lower limb: Secondary | ICD-10-CM | POA: Diagnosis not present

## 2022-12-08 LAB — CBC
HCT: 35.9 % — ABNORMAL LOW (ref 39.0–52.0)
Hemoglobin: 11.9 g/dL — ABNORMAL LOW (ref 13.0–17.0)
MCH: 30.9 pg (ref 26.0–34.0)
MCHC: 33.1 g/dL (ref 30.0–36.0)
MCV: 93.2 fL (ref 80.0–100.0)
Platelets: 277 10*3/uL (ref 150–400)
RBC: 3.85 MIL/uL — ABNORMAL LOW (ref 4.22–5.81)
RDW: 16 % — ABNORMAL HIGH (ref 11.5–15.5)
WBC: 8.8 10*3/uL (ref 4.0–10.5)
nRBC: 0 % (ref 0.0–0.2)

## 2022-12-08 LAB — COMPREHENSIVE METABOLIC PANEL
ALT: 14 U/L (ref 0–44)
AST: 18 U/L (ref 15–41)
Albumin: 2.6 g/dL — ABNORMAL LOW (ref 3.5–5.0)
Alkaline Phosphatase: 51 U/L (ref 38–126)
Anion gap: 7 (ref 5–15)
BUN: 14 mg/dL (ref 6–20)
CO2: 22 mmol/L (ref 22–32)
Calcium: 8.1 mg/dL — ABNORMAL LOW (ref 8.9–10.3)
Chloride: 103 mmol/L (ref 98–111)
Creatinine, Ser: 0.83 mg/dL (ref 0.61–1.24)
GFR, Estimated: >60 mL/min (ref >60)
Glucose, Bld: 112 mg/dL — ABNORMAL HIGH (ref 70–99)
Potassium: 3.6 mmol/L (ref 3.5–5.1)
Sodium: 132 mmol/L — ABNORMAL LOW (ref 135–145)
Total Bilirubin: 1.1 mg/dL (ref 0.3–1.2)
Total Protein: 5.9 g/dL — ABNORMAL LOW (ref 6.5–8.1)

## 2022-12-08 LAB — PROTIME-INR
INR: 1.3 — ABNORMAL HIGH (ref 0.8–1.2)
Prothrombin Time: 16.5 seconds — ABNORMAL HIGH (ref 11.4–15.2)

## 2022-12-08 LAB — CORTISOL-AM, BLOOD: Cortisol - AM: 5.8 ug/dL — ABNORMAL LOW (ref 6.7–22.6)

## 2022-12-08 LAB — PROCALCITONIN: Procalcitonin: 0.16 ng/mL

## 2022-12-08 NOTE — Progress Notes (Addendum)
PROGRESS NOTE  [name] ASN:[id number] DOB: October 06, 1966 DOA: 12/07/2022 PCP: [name], NP  HPI/Recap of past 24 hours: [name] is a 57 y.o. male with medical history significant of chronic combined HF, CAD, HLD, polyneuropathy, bipolar d/o, COPD. Presenting with LLE chronic venous stasis wound pain. Pt was recently admitted for LLE wound infection. At the time he was seen by podiatry who recommended that he continue augmentin and outpt wound care. Pt reports compliance with the medication since discharge. However, he has had increasing pain since discharge. Of note, pt is homeless. Pt admitted for further management.    Today, pt still c/o LLE pain. 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 (HCC)   Schizoaffective disorder, bipolar type (HCC)   Tobacco use disorder   Cocaine abuse (HCC)   Essential hypertension   DM2 (diabetes mellitus, type 2) (HCC)   Open wounds involving multiple regions of lower extremity   Alcohol abuse   Sepsis due to cellulitis (HCC)   History of DVT (deep vein thrombosis)   COPD (chronic obstructive pulmonary disease) (HCC)   Sepsis likely 2/2 infected chronic venous stasis wound of LLE Tachycardic, fever on admission  ?HCAP Noted nonhealing full-thickness wound to LLE Noted venous insufficiency Afebrile, with mild leukocytosis Procalcitonin 0.16, LA WNL BC x 2 pending CXR persistent opacity in the mid right lung possibly pulmonary infarct but possibility of superimposed infection cannot be excluded X-ray of left lower extremity negative for osteo but cannot be excluded MRI tibia-fibula pending (MRI down here at [facility]) WOC on board, appreciate recs for wound dressing Consulted Dr. Lajoyce Corners on 12/08/2022, for evaluation and possible  amputation (pt is ok with amputation), rec to transfer to [name] Continue vancomycin, Flagyl, cefepime Stopped IVF Monitor, transfer order placed  Chronic systolic and diastolic HF Appears ?compensated, on RA BNP 189.5 Chest x-ray as above Echo done 9/23 showed EF of less than 20%, global hypokinesis, ventricular diastolic function could not be evaluated Continue Imdur, hold losartan for now pending BP Strict I's and O's  Hypertension BP stable Continue amlodipine, hold losartan for now pending BP  COPD Appears stable Continue nebs, inhalers  Diabetes mellitus type II Last A1c SSI, Accu-Cheks, hypoglycemic protocol  History of DVT Continue Eliquis  Alcohol/cocaine/tobacco abuse Advised to quit CIWA  Prolonged Qtc Repeat EKG  Bipolar disorder Hold home regimen until QTc has improved        Estimated body mass index is 25.72 kg/m as calculated from the following:   Height as of this encounter: 5\' 9"  (1.753 m).   Weight as of this encounter: 79 kg.     Code Status: Full  Family Communication: None at bedside  Disposition Plan: Status is: Inpatient Remains inpatient appropriate because: Level of care      Consultants: Orthopedics Dr.  Procedures: None  Antimicrobials: Vancomycin Cefepime Flagyl  DVT prophylaxis: Eliquis   Objective: Vitals:   12/08/22 0154 12/08/22 0411 12/08/22 1000 12/08/22 1343  BP:  125/74 (!) 147/95 117/70  Pulse:  100 96 95  Resp:  18 16 20   Temp:  98.9 F (37.2 C)  98.1 F (36.7 C)  TempSrc:  Oral  Oral  SpO2:  97% 100% 100%  Weight: 79 kg     Height:        Intake/Output  Summary (Last 24 hours) at 12/08/2022 1413 Last data filed at 12/08/2022 1004 Gross per 24 hour  Intake 2496.13 ml  Output 1450 ml  Net 1046.13 ml   Filed Weights   12/07/22 0754 12/07/22 2200 12/08/22 0154  Weight: 86.2 kg 79.3 kg 79 kg    Exam: General: NAD  Cardiovascular: S1, S2 present Respiratory: Diminished breath  sounds bilateral Abdomen: Soft, nontender, nondistended, bowel sounds present Musculoskeletal: R BKA, LLE wrapped in ACE wrap, C/d/i Skin: Normal Psychiatry: Normal mood     Data Reviewed: CBC: Recent Labs  Lab 12/03/22 0555 12/07/22 0920 12/08/22 0440  WBC 12.5* 11.2* 8.8  NEUTROABS  --  8.9*  --   HGB 12.9* 14.1 11.9*  HCT 43.4 43.4 35.9*  MCV 102.6* 93.5 93.2  PLT 373 385 277   Basic Metabolic Panel: Recent Labs  Lab 12/03/22 0555 12/07/22 0920 12/08/22 0440  NA 135 133* 132*  K 3.9 3.6 3.6  CL 103 100 103  CO2 21* 23 22  GLUCOSE 88 80 112*  BUN 23* 18 14  CREATININE 0.90 0.82 0.83  CALCIUM 8.1* 8.6* 8.1*   GFR: Estimated Creatinine Clearance: 101.7 mL/min (by C-G formula based on SCr of 0.83 mg/dL). Liver Function Tests: Recent Labs  Lab 12/03/22 0555 12/08/22 0440  AST 13* 18  ALT 15 14  ALKPHOS 52 51  BILITOT 0.4 1.1  PROT 7.0 5.9*  ALBUMIN 3.0* 2.6*   No results for input(s): "LIPASE", "AMYLASE" in the last 168 hours. No results for input(s): "AMMONIA" in the last 168 hours. Coagulation Profile: Recent Labs  Lab 12/08/22 0440  INR 1.3*   Cardiac Enzymes: No results for input(s): "CKTOTAL", "CKMB", "CKMBINDEX", "TROPONINI" in the last 168 hours. BNP (last 3 results) No results for input(s): "PROBNP" in the last 8760 hours. HbA1C: No results for input(s): "HGBA1C" in the last 72 hours. CBG: Recent Labs  Lab 12/02/22 1201 12/02/22 1642 12/02/22 2131 12/03/22 1124 12/07/22 1354  GLUCAP 164* 171* 166* 124* 101*   Lipid Profile: No results for input(s): "CHOL", "HDL", "LDLCALC", "TRIG", "CHOLHDL", "LDLDIRECT" in the last 72 hours. Thyroid Function Tests: No results for input(s): "TSH", "T4TOTAL", "FREET4", "T3FREE", "THYROIDAB" in the last 72 hours. Anemia Panel: No results for input(s): "VITAMINB12", "FOLATE", "FERRITIN", "TIBC", "IRON", "RETICCTPCT" in the last 72 hours. Urine analysis:    Component Value Date/Time   COLORURINE  YELLOW 08/17/2022 0625   APPEARANCEUR CLEAR 08/17/2022 0625   LABSPEC 1.021 08/17/2022 0625   PHURINE 5.0 08/17/2022 0625   GLUCOSEU NEGATIVE 08/17/2022 0625   HGBUR NEGATIVE 08/17/2022 0625   BILIRUBINUR NEGATIVE 08/17/2022 0625   KETONESUR NEGATIVE 08/17/2022 0625   PROTEINUR NEGATIVE 08/17/2022 0625   NITRITE NEGATIVE 08/17/2022 0625   LEUKOCYTESUR NEGATIVE 08/17/2022 0625   Sepsis Labs: @LABRCNTIP (procalcitonin:4,lacticidven:4)  ) Recent Results (from the past 240 hour(s))  Culture, blood (Routine X 2) w Reflex to ID Panel     Status: None (Preliminary result)   Collection Time: 12/07/22  5:30 PM   Specimen: BLOOD  Result Value Ref Range Status   Specimen Description   Final    BLOOD RIGHT ANTECUBITAL Performed at [facility], [address] [place], [name]    Special Requests   Final    BOTTLES DRAWN AEROBIC AND ANAEROBIC Blood Culture adequate volume Performed at [facility], [address], [place], [name]    Culture   Final    NO GROWTH < 12 HOURS Performed at [facility], [place] [address], [place], [place] [address]    Report Status PENDING  Incomplete  Culture, blood (Routine X 2) w Reflex to ID Panel     Status: None (Preliminary result)   Collection Time: 12/07/22  5:30 PM   Specimen: BLOOD  Result Value Ref Range Status   Specimen Description   Final    BLOOD SITE NOT SPECIFIED Performed at [place] [address] [place], [place] [address]    Special Requests   Final    BOTTLES DRAWN AEROBIC ONLY Blood Culture results may not be optimal due to an excessive volume of blood received in culture bottles Performed at [place] [address] [place], [place] [address]    Culture   Final    NO GROWTH < 12 HOURS Performed at [place] [address] [place], [place] [address]    Report Status PENDING  Incomplete      Studies: No  results found.  Scheduled Meds:  amLODipine  10 mg Oral Daily   apixaban  5 mg Oral BID   folic acid  1 mg Oral Daily   gabapentin  900 mg Oral TID   hydrocerin   Topical Daily   isosorbide mononitrate  15 mg Oral Daily   losartan  25 mg Oral Daily   multivitamin with minerals  1 tablet Oral Daily   thiamine  100 mg Oral Daily    Continuous Infusions:  ceFEPime (MAXIPIME) IV 2 g (12/08/22 0641)   lactated ringers 75 mL/hr at 12/07/22 2132   metronidazole 500 mg (12/08/22 0258)   vancomycin 1,500 mg (12/08/22 0434)     LOS: 1 day     Alma Friendly, MD [facility]  If 7PM-7AM, please contact night-coverage [email or web address] 12/08/2022, 2:13 PM

## 2022-12-08 NOTE — Progress Notes (Signed)
RN/NT attempted to collect VS. Pt refused.

## 2022-12-08 NOTE — Progress Notes (Addendum)
RN unable to complete head to to assessment. Patient refused to allow RN to assess IV site after continuous alarming and lower abdomen and lower extremities. The patient is not agreeable to education or explanation for assessment. Pt refused folic acid.

## 2022-12-09 DIAGNOSIS — I96 Gangrene, not elsewhere classified: Secondary | ICD-10-CM

## 2022-12-09 DIAGNOSIS — L03116 Cellulitis of left lower limb: Secondary | ICD-10-CM | POA: Diagnosis not present

## 2022-12-09 LAB — BASIC METABOLIC PANEL
Anion gap: 7 (ref 5–15)
BUN: 17 mg/dL (ref 6–20)
CO2: 20 mmol/L — ABNORMAL LOW (ref 22–32)
Calcium: 8.1 mg/dL — ABNORMAL LOW (ref 8.9–10.3)
Chloride: 107 mmol/L (ref 98–111)
Creatinine, Ser: 0.86 mg/dL (ref 0.61–1.24)
GFR, Estimated: >60 mL/min (ref >60)
Glucose, Bld: 127 mg/dL — ABNORMAL HIGH (ref 70–99)
Potassium: 4.1 mmol/L (ref 3.5–5.1)
Sodium: 134 mmol/L — ABNORMAL LOW (ref 135–145)

## 2022-12-09 LAB — CBC WITH DIFFERENTIAL/PLATELET
Abs Immature Granulocytes: 0.03 10*3/uL (ref 0.00–0.07)
Basophils Absolute: 0 10*3/uL (ref 0.0–0.1)
Basophils Relative: 0 %
Eosinophils Absolute: 0.1 10*3/uL (ref 0.0–0.5)
Eosinophils Relative: 2 %
HCT: 36.2 % — ABNORMAL LOW (ref 39.0–52.0)
Hemoglobin: 11.5 g/dL — ABNORMAL LOW (ref 13.0–17.0)
Immature Granulocytes: 0 %
Lymphocytes Relative: 21 %
Lymphs Abs: 1.7 10*3/uL (ref 0.7–4.0)
MCH: 30 pg (ref 26.0–34.0)
MCHC: 31.8 g/dL (ref 30.0–36.0)
MCV: 94.5 fL (ref 80.0–100.0)
Monocytes Absolute: 0.8 10*3/uL (ref 0.1–1.0)
Monocytes Relative: 10 %
Neutro Abs: 5.3 10*3/uL (ref 1.7–7.7)
Neutrophils Relative %: 67 %
Platelets: 270 10*3/uL (ref 150–400)
RBC: 3.83 MIL/uL — ABNORMAL LOW (ref 4.22–5.81)
RDW: 15.9 % — ABNORMAL HIGH (ref 11.5–15.5)
WBC: 8 10*3/uL (ref 4.0–10.5)
nRBC: 0 % (ref 0.0–0.2)

## 2022-12-09 LAB — HEMOGLOBIN A1C
Hgb A1c MFr Bld: 6.1 % — ABNORMAL HIGH (ref 4.8–5.6)
Mean Plasma Glucose: 128 mg/dL

## 2022-12-09 MED ORDER — CHLORHEXIDINE GLUCONATE 4 % EX LIQD
60.0000 mL | Freq: Once | CUTANEOUS | Status: DC
  Filled 2022-12-09: qty 60

## 2022-12-09 MED ORDER — POVIDONE-IODINE 10 % EX SWAB
2.0000 | Freq: Once | CUTANEOUS | Status: AC
  Administered 2022-12-10: 2 via TOPICAL

## 2022-12-09 NOTE — Consult Note (Signed)
ORTHOPAEDIC CONSULTATION  REQUESTING PHYSICIAN: Briant Cedar, MD  Chief Complaint: Ischemic ulcers left foot.  HPI: Tamara Kenyon is a 57 y.o. male who presents with ischemic ulceration of left foot with purulent drainage.  Patient is status post a right above-the-knee amputation . Patient has had noninvasive studies of the left lower extremity that shows biphasic flow throughout the left lower extremity with no significant area of stenosis noted.  Past Medical History:  Diagnosis Date   Asthma    Bipolar 1 disorder (HCC)    CHF (congestive heart failure) (HCC)    COPD (chronic obstructive pulmonary disease) (HCC)    Depression    Diabetes mellitus without complication (HCC)    GERD (gastroesophageal reflux disease)    Homelessness    Hypertension    QT prolongation    Schizoaffective disorder [facility])    Past Surgical History:  Procedure Laterality Date   AMPUTATION TOE Bilateral 01/02/2021   Procedure: AMPUTATION TOE;  Surgeon: Park Liter, DPM;  Location: [facility] OR;  Service: Podiatry;  Laterality: Bilateral;  Ok to leave patient on hospital bed   IR ANGIO INTRA EXTRACRAN SEL COM CAROTID INNOMINATE BILAT MOD SED  12/17/2020   IR ANGIO VERTEBRAL SEL VERTEBRAL BILAT MOD SED  12/17/2020   RADIOLOGY WITH ANESTHESIA N/A 12/17/2020   Procedure: IR WITH ANESTHESIA;  Surgeon: Radiologist, Medication, MD;  Location: [facility] OR;  Service: Radiology;  Laterality: N/A;   Social History   Socioeconomic History   Marital status: Single    Spouse name: Not on file   Number of children: 4   Years of education: Not on file   Highest education level: Not on file  Occupational History   Occupation: N/A  Tobacco Use   Smoking status: Some Days    Years: 30.00    Types: Cigarettes   Smokeless tobacco: Never  Vaping Use   Vaping Use: Never used  Substance and Sexual Activity   Alcohol use: Yes   Drug use: Yes    Types: Cocaine   Sexual activity: Not Currently    Partners: Female     Birth control/protection: None  Other Topics Concern   Not on file  Social History Narrative   ** Merged History Encounter **       Social Determinants of Health   Financial Resource Strain: Not on file  Food Insecurity: Food Insecurity Present (11/24/2022)   Hunger Vital Sign    Worried About Running Out of Food in the Last Year: Often true    Ran Out of Food in the Last Year: Often true  Transportation Needs: Unmet Transportation Needs (12/07/2022)   PRAPARE - Administrator, Civil Service (Medical): No    Lack of Transportation (Non-Medical): Yes  Physical Activity: Not on file  Stress: Not on file  Social Connections: Not on file   Family History  Problem Relation Age of Onset   Drug abuse Mother    Suicidality Mother    - negative except otherwise stated in the family history section Allergies  Allergen Reactions   Motrin [Ibuprofen] Anaphylaxis   Tylenol [Acetaminophen] Hives, Nausea And Vomiting and Rash   Lyrica Cr [Pregabalin Er] Other (See Comments)    Dystonia Dysarthria    Peanut-Containing Drug Products Other (See Comments)    Pt denies allergy to peanut butter 03/02/2021   Prior to Admission medications   Medication Sig Start Date End Date Taking? Authorizing Provider  albuterol (VENTOLIN HFA) 108 (90 Base) MCG/ACT inhaler  Inhale 2 puffs into the lungs every 6 (six) hours as needed for wheezing or shortness of breath. 07/25/22  Yes Danford, Suann Larry, MD  amLODipine (NORVASC) 10 MG tablet Take 1 tablet (10 mg total) by mouth daily. 10/10/22  Yes Nita Sells, MD  amoxicillin-clavulanate (AUGMENTIN) 500-125 MG tablet Take 1 tablet by mouth 3 (three) times daily for 7 days. 12/03/22 12/10/22 Yes Shah, Pratik D, DO  apixaban (ELIQUIS) 5 MG TABS tablet Take 1 tablet (5 mg total) by mouth 2 (two) times daily. 10/09/22  Yes Nita Sells, MD  ARIPiprazole (ABILIFY) 10 MG tablet Take 1 tablet (10 mg total) by mouth daily. 10/10/22  Yes  Nita Sells, MD  escitalopram (LEXAPRO) 5 MG tablet Take 1 tablet (5 mg total) by mouth daily. 10/10/22  Yes Nita Sells, MD  folic acid (FOLVITE) 1 MG tablet Take 1 tablet (1 mg total) by mouth daily. 12/03/22 01/02/23 Yes Shah, Pratik D, DO  gabapentin (NEURONTIN) 300 MG capsule Take 3 capsules (900 mg total) by mouth 3 (three) times daily. 12/03/22 01/02/23 Yes Shah, Pratik D, DO  isosorbide mononitrate (IMDUR) 30 MG 24 hr tablet Take 0.5 tablets (15 mg total) by mouth daily. 12/03/22 01/02/23 Yes Shah, Pratik D, DO  losartan (COZAAR) 25 MG tablet Take 1 tablet (25 mg total) by mouth daily. 12/03/22 01/02/23 Yes Shah, Pratik D, DO  oxyCODONE (OXY IR/ROXICODONE) 5 MG immediate release tablet Take 1-2 tablets (5-10 mg total) by mouth every 4 (four) hours as needed for moderate pain. 12/03/22  Yes Shah, Pratik D, DO  thiamine (VITAMIN B-1) 100 MG tablet Take 1 tablet (100 mg total) by mouth daily. 12/03/22 01/02/23 Yes Shah, Pratik D, DO  traZODone (DESYREL) 50 MG tablet Take 1 tablet (50 mg total) by mouth at bedtime as needed for sleep. 10/09/22  Yes Nita Sells, MD   CT Angio Chest PE W/Cm &/Or Wo Cm  Result Date: 12/07/2022 CLINICAL DATA:  Pulmonary embolism suspected. Leg swelling and leg pain. EXAM: CT ANGIOGRAPHY CHEST WITH CONTRAST TECHNIQUE: Multidetector CT imaging of the chest was performed using the standard protocol during bolus administration of intravenous contrast. Multiplanar CT image reconstructions and MIPs were obtained to evaluate the vascular anatomy. RADIATION DOSE REDUCTION: This exam was performed according to the departmental dose-optimization program which includes automated exposure control, adjustment of the mA and/or kV according to patient size and/or use of iterative reconstruction technique. CONTRAST:  141mL OMNIPAQUE IOHEXOL 350 MG/ML SOLN COMPARISON:  CT angio dated November 24, 2022. FINDINGS: Cardiovascular: Preferential opacification of the pulmonary arteries to  the segmental levels. No evidence of central pulmonary embolism. Evaluation of distal segmental and subsegmental pulmonary arteries is limited due to mixing artifact and respiratory motion. Heart is normal in size. Atherosclerotic calcification of aorta and coronary vessels. No pericardial effusion. Mediastinum/Nodes: No enlarged mediastinal, hilar, or axillary lymph nodes. Thyroid gland, trachea, and esophagus demonstrate no significant findings. Lungs/Pleura: Small scattered opacities in the dependent portions of the right upper, right middle lobes likely representing atelectasis sequela of prior infectious/inflammatory process. Emphysematous changes. No evidence of acute pneumonia or large pleural effusion. Upper Abdomen: No acute abnormality. Musculoskeletal: No chest wall abnormality. No acute or significant osseous findings. Review of the MIP images confirms the above findings. IMPRESSION: 1. Preferential opacification of the pulmonary arteries to the segmental levels. No evidence of central pulmonary embolism. Evaluation of distal segmental and subsegmental pulmonary arteries is limited due to mixing artifact and respiratory motion. 2. Small scattered opacities in the dependent portions  of the right upper, right middle lobes likely representing atelectasis sequela of prior infectious/inflammatory process. No evidence of acute pneumonia or large pleural effusion. 3. Aortic atherosclerosis. Aortic Atherosclerosis (ICD10-I70.0) and Emphysema (ICD10-J43.9). Electronically Signed   By: Keane Police D.O.   On: 12/07/2022 14:28   - pertinent xrays, CT, MRI studies were reviewed and independently interpreted  Positive ROS: All other systems have been reviewed and were otherwise negative with the exception of those mentioned in the HPI and as above.  Physical Exam: General: Alert, no acute distress Psychiatric: Patient is competent for consent with normal mood and affect Lymphatic: No axillary or cervical  lymphadenopathy Cardiovascular: No pedal edema Respiratory: No cyanosis, no use of accessory musculature GI: No organomegaly, abdomen is soft and non-tender    Images:  @ENCIMAGES @  Labs:  Lab Results  Component Value Date   HGBA1C 6.1 (H) 12/07/2022   HGBA1C 6.0 (H) 08/17/2022   HGBA1C 7.0 (H) 03/24/2022   ESRSEDRATE 63 (H) 03/23/2022   ESRSEDRATE 33 (H) 11/13/2020   CRP 10.6 (H) 03/23/2022   CRP 3.3 (H) 12/03/2021   CRP 10.8 (H) 11/13/2020   REPTSTATUS PENDING 12/07/2022   REPTSTATUS PENDING 12/07/2022   GRAMSTAIN  07/13/2022    NO ORGANISMS SEEN RARE WBC PRESENT, PREDOMINANTLY PMN RARE SQUAMOUS EPITHELIAL CELLS PRESENT    CULT  12/07/2022    NO GROWTH 2 DAYS Performed at [facility], [place] [address] [place], [place] [address]    CULT  12/07/2022    NO GROWTH 2 DAYS Performed at [place] [address] [place], [place] [address]    [place] CLOACAE (A) 01/29/2022    Lab Results  Component Value Date   ALBUMIN 2.6 (L) 12/08/2022   ALBUMIN 3.0 (L) 12/03/2022   ALBUMIN 3.0 (L) 12/01/2022        Latest Ref Rng & Units 12/09/2022    4:31 AM 12/08/2022    4:40 AM 12/07/2022    9:20 AM  CBC EXTENDED  WBC 4.0 - 10.5 K/uL 8.0  8.8  11.2   RBC 4.22 - 5.81 MIL/uL 3.83  3.85  4.64   Hemoglobin 13.0 - 17.0 g/dL 11.5  11.9  14.1   HCT 39.0 - 52.0 % 36.2  35.9  43.4   Platelets 150 - 400 K/uL 270  277  385   NEUT# 1.7 - 7.7 K/uL 5.3   8.9   Lymph# 0.7 - 4.0 K/uL 1.7   1.7     Neurologic: Patient does not have protective sensation bilateral lower extremities.   MUSCULOSKELETAL:   Skin: Examination patient has black gangrenous changes medial and lateral aspect of the left ankle with cellulitis and purulent drainage.  Patient's calf is soft there is no signs of infection extending proximally to the calf.  Patient has indurated skin dorsally over the foot I cannot palpate a pulse.  Hemoglobin 11.6.  White cell count 8.0.  Albumin 2.6.   Hemoglobin A1c 6.1.  Assessment: Assessment: Gangrenous changes with cellulitis and abscess left foot status post right above-the-knee amputation with no major vessel arterial insufficiency.  Plan: Discussed with patient limb salvage options including a below-knee amputation on the left.  Patient states he would like to proceed with a left below the knee amputation.  Plan for surgery at [facility] on Wednesday.  Patient will need to transfer to [facility] for surgery.  Thank you for the consult and the opportunity to see Mr. Abdulaziz Chaplain, [place]  [phone] 1:23 PM

## 2022-12-09 NOTE — H&P (View-Only) (Signed)
ORTHOPAEDIC CONSULTATION  REQUESTING PHYSICIAN: Briant Cedar, MD  Chief Complaint: Ischemic ulcers left foot.  HPI: [name] is a 57 y.o. male who presents with ischemic ulceration of left foot with purulent drainage.  Patient is status post a right above-the-knee amputation . Patient has had noninvasive studies of the left lower extremity that shows biphasic flow throughout the left lower extremity with no significant area of stenosis noted.  Past Medical History:  Diagnosis Date   Asthma    Bipolar 1 disorder (HCC)    CHF (congestive heart failure) (HCC)    COPD (chronic obstructive pulmonary disease) (HCC)    Depression    Diabetes mellitus without complication (HCC)    GERD (gastroesophageal reflux disease)    Homelessness    Hypertension    QT prolongation    Schizoaffective disorder [facility])    Past Surgical History:  Procedure Laterality Date   AMPUTATION TOE Bilateral 01/02/2021   Procedure: AMPUTATION TOE;  Surgeon: Park Liter, DPM;  Location: [facility] OR;  Service: Podiatry;  Laterality: Bilateral;  Ok to leave patient on hospital bed   IR ANGIO INTRA EXTRACRAN SEL COM CAROTID INNOMINATE BILAT MOD SED  12/17/2020   IR ANGIO VERTEBRAL SEL VERTEBRAL BILAT MOD SED  12/17/2020   RADIOLOGY WITH ANESTHESIA N/A 12/17/2020   Procedure: IR WITH ANESTHESIA;  Surgeon: Radiologist, Medication, MD;  Location: [facility] OR;  Service: Radiology;  Laterality: N/A;   Social History   Socioeconomic History   Marital status: Single    Spouse name: Not on file   Number of children: 4   Years of education: Not on file   Highest education level: Not on file  Occupational History   Occupation: N/A  Tobacco Use   Smoking status: Some Days    Years: 30.00    Types: Cigarettes   Smokeless tobacco: Never  Vaping Use   Vaping Use: Never used  Substance and Sexual Activity   Alcohol use: Yes   Drug use: Yes    Types: Cocaine   Sexual activity: Not Currently    Partners: Female     Birth control/protection: None  Other Topics Concern   Not on file  Social History Narrative   ** Merged History Encounter **       Social Determinants of Health   Financial Resource Strain: Not on file  Food Insecurity: Food Insecurity Present (11/24/2022)   Hunger Vital Sign    Worried About Running Out of Food in the Last Year: Often true    Ran Out of Food in the Last Year: Often true  Transportation Needs: Unmet Transportation Needs (12/07/2022)   PRAPARE - Administrator, Civil Service (Medical): No    Lack of Transportation (Non-Medical): Yes  Physical Activity: Not on file  Stress: Not on file  Social Connections: Not on file   Family History  Problem Relation Age of Onset   Drug abuse Mother    Suicidality Mother    - negative except otherwise stated in the family history section Allergies  Allergen Reactions   Motrin [Ibuprofen] Anaphylaxis   Tylenol [Acetaminophen] Hives, Nausea And Vomiting and Rash   Lyrica Cr [Pregabalin Er] Other (See Comments)    Dystonia Dysarthria    Peanut-Containing Drug Products Other (See Comments)    Pt denies allergy to peanut butter 03/02/2021   Prior to Admission medications   Medication Sig Start Date End Date Taking? Authorizing Provider  albuterol (VENTOLIN HFA) 108 (90 Base) MCG/ACT inhaler  Inhale 2 puffs into the lungs every 6 (six) hours as needed for wheezing or shortness of breath. 07/25/22  Yes Danford, Christopher P, MD  amLODipine (NORVASC) 10 MG tablet Take 1 tablet (10 mg total) by mouth daily. 10/10/22  Yes Samtani, Jai-Gurmukh, MD  amoxicillin-clavulanate (AUGMENTIN) 500-125 MG tablet Take 1 tablet by mouth 3 (three) times daily for 7 days. 12/03/22 12/10/22 Yes Shah, Pratik D, DO  apixaban (ELIQUIS) 5 MG TABS tablet Take 1 tablet (5 mg total) by mouth 2 (two) times daily. 10/09/22  Yes Samtani, Jai-Gurmukh, MD  ARIPiprazole (ABILIFY) 10 MG tablet Take 1 tablet (10 mg total) by mouth daily. 10/10/22  Yes  Samtani, Jai-Gurmukh, MD  escitalopram (LEXAPRO) 5 MG tablet Take 1 tablet (5 mg total) by mouth daily. 10/10/22  Yes Samtani, Jai-Gurmukh, MD  folic acid (FOLVITE) 1 MG tablet Take 1 tablet (1 mg total) by mouth daily. 12/03/22 01/02/23 Yes Shah, Pratik D, DO  gabapentin (NEURONTIN) 300 MG capsule Take 3 capsules (900 mg total) by mouth 3 (three) times daily. 12/03/22 01/02/23 Yes Shah, Pratik D, DO  isosorbide mononitrate (IMDUR) 30 MG 24 hr tablet Take 0.5 tablets (15 mg total) by mouth daily. 12/03/22 01/02/23 Yes Shah, Pratik D, DO  losartan (COZAAR) 25 MG tablet Take 1 tablet (25 mg total) by mouth daily. 12/03/22 01/02/23 Yes Shah, Pratik D, DO  oxyCODONE (OXY IR/ROXICODONE) 5 MG immediate release tablet Take 1-2 tablets (5-10 mg total) by mouth every 4 (four) hours as needed for moderate pain. 12/03/22  Yes Shah, Pratik D, DO  thiamine (VITAMIN B-1) 100 MG tablet Take 1 tablet (100 mg total) by mouth daily. 12/03/22 01/02/23 Yes Shah, Pratik D, DO  traZODone (DESYREL) 50 MG tablet Take 1 tablet (50 mg total) by mouth at bedtime as needed for sleep. 10/09/22  Yes Samtani, Jai-Gurmukh, MD   CT Angio Chest PE W/Cm &/Or Wo Cm  Result Date: 12/07/2022 CLINICAL DATA:  Pulmonary embolism suspected. Leg swelling and leg pain. EXAM: CT ANGIOGRAPHY CHEST WITH CONTRAST TECHNIQUE: Multidetector CT imaging of the chest was performed using the standard protocol during bolus administration of intravenous contrast. Multiplanar CT image reconstructions and MIPs were obtained to evaluate the vascular anatomy. RADIATION DOSE REDUCTION: This exam was performed according to the departmental dose-optimization program which includes automated exposure control, adjustment of the mA and/or kV according to patient size and/or use of iterative reconstruction technique. CONTRAST:  100mL OMNIPAQUE IOHEXOL 350 MG/ML SOLN COMPARISON:  CT angio dated November 24, 2022. FINDINGS: Cardiovascular: Preferential opacification of the pulmonary arteries to  the segmental levels. No evidence of central pulmonary embolism. Evaluation of distal segmental and subsegmental pulmonary arteries is limited due to mixing artifact and respiratory motion. Heart is normal in size. Atherosclerotic calcification of aorta and coronary vessels. No pericardial effusion. Mediastinum/Nodes: No enlarged mediastinal, hilar, or axillary lymph nodes. Thyroid gland, trachea, and esophagus demonstrate no significant findings. Lungs/Pleura: Small scattered opacities in the dependent portions of the right upper, right middle lobes likely representing atelectasis sequela of prior infectious/inflammatory process. Emphysematous changes. No evidence of acute pneumonia or large pleural effusion. Upper Abdomen: No acute abnormality. Musculoskeletal: No chest wall abnormality. No acute or significant osseous findings. Review of the MIP images confirms the above findings. IMPRESSION: 1. Preferential opacification of the pulmonary arteries to the segmental levels. No evidence of central pulmonary embolism. Evaluation of distal segmental and subsegmental pulmonary arteries is limited due to mixing artifact and respiratory motion. 2. Small scattered opacities in the dependent portions   of the right upper, right middle lobes likely representing atelectasis sequela of prior infectious/inflammatory process. No evidence of acute pneumonia or large pleural effusion. 3. Aortic atherosclerosis. Aortic Atherosclerosis (ICD10-I70.0) and Emphysema (ICD10-J43.9). Electronically Signed   By: Imran  Ahmed D.O.   On: 12/07/2022 14:28   - pertinent xrays, CT, MRI studies were reviewed and independently interpreted  Positive ROS: All other systems have been reviewed and were otherwise negative with the exception of those mentioned in the HPI and as above.  Physical Exam: General: Alert, no acute distress Psychiatric: Patient is competent for consent with normal mood and affect Lymphatic: No axillary or cervical  lymphadenopathy Cardiovascular: No pedal edema Respiratory: No cyanosis, no use of accessory musculature GI: No organomegaly, abdomen is soft and non-tender    Images:  @ENCIMAGES@  Labs:  Lab Results  Component Value Date   HGBA1C 6.1 (H) 12/07/2022   HGBA1C 6.0 (H) 08/17/2022   HGBA1C 7.0 (H) 03/24/2022   ESRSEDRATE 63 (H) 03/23/2022   ESRSEDRATE 33 (H) 11/13/2020   CRP 10.6 (H) 03/23/2022   CRP 3.3 (H) 12/03/2021   CRP 10.8 (H) 11/13/2020   REPTSTATUS PENDING 12/07/2022   REPTSTATUS PENDING 12/07/2022   GRAMSTAIN  07/13/2022    NO ORGANISMS SEEN RARE WBC PRESENT, PREDOMINANTLY PMN RARE SQUAMOUS EPITHELIAL CELLS PRESENT    CULT  12/07/2022    NO GROWTH 2 DAYS Performed at [facility], [address] [place], [place] [address]    CULT  12/07/2022    NO GROWTH 2 DAYS Performed at [facility], [address] [place], [place] [address]    LABORGA ENTEROBACTER CLOACAE (A) 01/29/2022    Lab Results  Component Value Date   ALBUMIN 2.6 (L) 12/08/2022   ALBUMIN 3.0 (L) 12/03/2022   ALBUMIN 3.0 (L) 12/01/2022        Latest Ref Rng & Units 12/09/2022    4:31 AM 12/08/2022    4:40 AM 12/07/2022    9:20 AM  CBC EXTENDED  WBC 4.0 - 10.5 K/uL 8.0  8.8  11.2   RBC 4.22 - 5.81 MIL/uL 3.83  3.85  4.64   Hemoglobin 13.0 - 17.0 g/dL 11.5  11.9  14.1   HCT 39.0 - 52.0 % 36.2  35.9  43.4   Platelets 150 - 400 K/uL 270  277  385   NEUT# 1.7 - 7.7 K/uL 5.3   8.9   Lymph# 0.7 - 4.0 K/uL 1.7   1.7     Neurologic: Patient does not have protective sensation bilateral lower extremities.   MUSCULOSKELETAL:   Skin: Examination patient has black gangrenous changes medial and lateral aspect of the left ankle with cellulitis and purulent drainage.  Patient's calf is soft there is no signs of infection extending proximally to the calf.  Patient has indurated skin dorsally over the foot I cannot palpate a pulse.  Hemoglobin 11.6.  White cell count 8.0.  Albumin 2.6.   Hemoglobin A1c 6.1.  Assessment: Assessment: Gangrenous changes with cellulitis and abscess left foot status post right above-the-knee amputation with no major vessel arterial insufficiency.  Plan: Discussed with patient limb salvage options including a below-knee amputation on the left.  Patient states he would like to proceed with a left below the knee amputation.  Plan for surgery at [facility] on Wednesday.  Patient will need to transfer to [facility] for surgery.  Thank you for the consult and the opportunity to see Mr. Penagos  Adil Tugwell, MD [facility]   [phone] 1:23 PM

## 2022-12-09 NOTE — TOC Initial Note (Signed)
Transition of Care [facility]) - Initial/Assessment Note    Patient Details  Name: [name] MRN: [id number] Date of Birth: [birth year]  [facility]) CM/SW Contact:    Golda Acre, RN Phone Number: 12/09/2022, 9:05 AM  Clinical Narrative:                 Patient has received information for smoking cessation and resources for drug use and homeless within the last month.  Expected Discharge Plan: Home/Self Care Barriers to Discharge: Homeless with medical needs   Patient Goals and CMS Choice Patient states their goals for this hospitalization and ongoing recovery are:: home less CMS Medicare.gov Compare Post Acute Care list provided to:: Patient        Expected Discharge Plan and Services   Discharge Planning Services: CM Consult   Living arrangements for the past 2 months: Homeless Shelter                                      Prior Living Arrangements/Services Living arrangements for the past 2 months: Homeless Shelter Lives with:: Self Patient language and need for interpreter reviewed:: Yes Do you feel safe going back to the place where you live?: Yes            Criminal Activity/Legal Involvement Pertinent to Current Situation/Hospitalization: No - Comment as needed  Activities of Daily Living Home Assistive Devices/Equipment: Wheelchair ADL Screening (condition at time of admission) Patient's cognitive ability adequate to safely complete daily activities?: No Is the patient deaf or have difficulty hearing?: No Does the patient have difficulty seeing, even when wearing glasses/contacts?: No Does the patient have difficulty concentrating, remembering, or making decisions?: No Patient able to express need for assistance with ADLs?: No Does the patient have difficulty dressing or bathing?: No Independently performs ADLs?: Yes (appropriate for developmental age) Does the patient have difficulty walking or climbing stairs?: Yes Weakness of  Legs: Left Weakness of Arms/Hands: None  Permission Sought/Granted                  Emotional Assessment Appearance:: Appears stated age Attitude/Demeanor/Rapport: Engaged Affect (typically observed): Appropriate, Irritable Orientation: : Oriented to Self Alcohol / Substance Use: Tobacco Use, Alcohol Use, Illicit Drugs (smoker, etoh intake and cocaine) Psych Involvement: No (comment)  Admission diagnosis:  Cellulitis of left lower extremity [L03.116] Cellulitis of left ankle [L03.116] Patient Active Problem List   Diagnosis Date Noted   Cellulitis of left ankle 12/07/2022   COPD (chronic obstructive pulmonary disease) (HCC) 11/25/2022   Pulmonary emboli (HCC) 08/17/2022   Venous ulcer (HCC) 07/23/2022   Sinus bradycardia 07/23/2022   Anemia of chronic disease 07/23/2022   S/P AKA (above knee amputation), right (HCC) 07/23/2022   Acute on chronic systolic congestive heart failure (HCC) 07/12/2022   History of DVT (deep vein thrombosis) 07/12/2022   CAP (community acquired pneumonia) 07/12/2022   Chronic venous hypertension (idiopathic) with ulcer and inflammation of bilateral lower extremity (HCC)    Sepsis due to cellulitis (HCC) 04/04/2022   Normocytic anemia 04/04/2022   Acute deep vein thrombosis (DVT) of brachial vein (HCC) 03/31/2022   Swelling of lower extremity 03/23/2022   SIRS (systemic inflammatory response syndrome) (HCC) 03/23/2022   Leg swelling 03/23/2022   Acute on chronic systolic heart failure (HCC) 02/26/2022   Seizure (HCC) 02/26/2022   Venous stasis ulcer of right ankle limited to breakdown of skin with varicose  veins (HCC)    Hypertensive urgency 01/29/2022   Prolonged QT interval 01/29/2022   Alcohol abuse 01/29/2022   chronic ulcers 01/29/2022   Wound infection 12/04/2021   Open wounds involving multiple regions of lower extremity 12/03/2021   Mixed diabetic hyperlipidemia associated with type 2 diabetes mellitus (HCC) 12/03/2021   Cellulitis  of right lower extremity 11/13/2021   DM2 (diabetes mellitus, type 2) (HCC) 11/13/2021   Antisocial personality disorder (HCC) 11/04/2021   Malingering 11/04/2021   Foot pain 04/23/2021   Chronic combined systolic and diastolic congestive heart failure (HCC) 03/02/2021   History of stroke 03/02/2021   Polysubstance abuse (HCC) 03/02/2021   Skin ulcer of right great toe with necrosis of bone (HCC)    Encounter for assessment of healthcare decision-making capacity    Pain, dental    Dystonia    Dysarthria    Shock liver 01/04/2021   Acute metabolic encephalopathy 01/04/2021   Coronary artery disease involving native coronary artery of native heart without angina pectoris 01/04/2021   Essential hypertension 01/04/2021   Bipolar I disorder (HCC) 01/04/2021   Cocaine abuse (HCC) 01/04/2021   Homelessness 01/04/2021   HLD (hyperlipidemia) 01/04/2021   Osteomyelitis of great toe (HCC)    Ulcer of great toe, left, with necrosis of bone (HCC)    Ulcer of great toe, right, with necrosis of muscle (HCC)    Acute osteomyelitis of toe, left (HCC)    Acute on chronic combined systolic and diastolic CHF (congestive heart failure) (HCC) 12/20/2020   Respiratory failure (HCC)    Ischemic stroke (HCC) 12/17/2020   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   Diabetic foot ulcer (HCC) 12/08/2020   CHF (congestive heart failure) (HCC) 11/14/2020   CHF exacerbation (HCC) 11/13/2020   Hypokalemia    Hyperlipidemia    Acute hypoxemic respiratory failure (HCC) 10/26/2020   COPD with acute exacerbation (HCC) 10/14/2020   Acute on chronic combined systolic and diastolic CHF (congestive heart failure) (HCC) 10/02/2020   Heart failure with reduced ejection fraction and diastolic dysfunction (HCC) 09/07/2020   Near syncope 07/21/2020   Acid reflux 06/03/2020   Cannabis abuse 03/19/2020   Acute bronchitis due to Rhinovirus 09/08/2019   Schizoaffective disorder (HCC) 06/24/2019   SOB (shortness of breath)    QT  prolongation 05/25/2019   Cocaine abuse (HCC)    Elevated troponin    Homeless    Suicidal ideation    Bilateral lower extremity pain 04/05/2019   Housing situation unstable 03/28/2019   Bilateral lower extremity edema    Acute exacerbation of CHF (congestive heart failure) (HCC) 03/04/2019   Alcohol withdrawal syndrome, with delirium (HCC) 05/03/2018   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   Mild intermittent asthma without complication 08/28/2017   Asthma 12/26/2016   Tobacco use disorder 12/26/2016   Schizophrenia (HCC) 12/26/2016   Severe episode of recurrent major depressive disorder, with psychotic features (HCC) 10/31/2016   Cocaine abuse with cocaine-induced mood disorder (HCC) 09/06/2016   Alcohol use disorder, mild, abuse 07/31/2016   Substance induced mood disorder (HCC) 07/30/2016   Schizoaffective disorder, bipolar type (HCC) 05/31/2016   Cocaine use disorder, severe, dependence (HCC) 05/31/2016   Major depressive disorder, recurrent episode (HCC) 05/30/2016   Suicidal behavior with attempted self-injury (HCC) 05/08/2016   Acetaminophen overdose 05/06/2016   PCP:  [name], NP Pharmacy:   [facility] - [place], [name] - [place] [address] [name] [address] Phone: [phone] Fax: [phone]  [place] [address] Phone: [phone] Fax: [phone]     Social Determinants of Health (SDOH) Social History: SDOH Screenings   Food Insecurity: Food Insecurity Present (11/24/2022)  Housing: Medium Risk (12/07/2022)  Transportation Needs: Unmet Transportation Needs (12/07/2022)  Utilities: Not At Risk (12/07/2022)  Alcohol Screen: Medium Risk (06/24/2019)  Depression (PHQ2-9): Medium Risk (02/10/2021)  Tobacco Use: High Risk (12/07/2022)   SDOH Interventions: Food Insecurity Interventions: Patient Refused Housing  Interventions: Patient Refused Transportation Interventions: Payor Benefit Utilities Interventions: Intervention Not Indicated   Readmission Risk Interventions   Row Labels 12/09/2022    9:02 AM 12/01/2022    3:56 PM 08/18/2022    9:00 AM  Readmission Risk Prevention Plan   Section Header. No data exists in this row.     Transportation Screening   Complete Complete Complete  Medication Review Press photographer)   Complete Complete Complete  PCP or Specialist appointment within 3-5 days of discharge   Complete Complete Complete  HRI or Home Care Consult   Complete Complete Complete  SW Recovery Care/Counseling Consult   Complete Complete Complete  Palliative Care Screening   Not Applicable Not Applicable Not Applicable  [place]   Not Applicable Not Applicable Not Applicable  SNF Comments    n/a

## 2022-12-09 NOTE — Progress Notes (Signed)
Pt did not allow this writer to assess or change dressing at this time.

## 2022-12-09 NOTE — Progress Notes (Signed)
PROGRESS NOTE  [name] ZOX:[id number] DOB: July 16, 1966 DOA: 12/07/2022 PCP: [name], NP  HPI/Recap of past 24 hours: [name] is a 57 y.o. male with medical history significant of chronic combined HF, CAD, HLD, polyneuropathy, bipolar d/o, COPD. Presenting with LLE chronic venous stasis wound pain. Pt was recently admitted for LLE wound infection. At the time he was seen by podiatry who recommended that he continue augmentin and outpt wound care. Pt reports compliance with the medication since discharge. However, he has had increasing pain since discharge. Of note, pt is homeless. Pt admitted for further management.    Today, pt denies any new complaints except for pain to LLE.    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 (HCC)   Schizoaffective disorder, bipolar type (HCC)   Tobacco use disorder   Cocaine abuse (HCC)   Essential hypertension   DM2 (diabetes mellitus, type 2) (HCC)   Open wounds involving multiple regions of lower extremity   Alcohol abuse   Sepsis due to cellulitis (HCC)   History of DVT (deep vein thrombosis)   COPD (chronic obstructive pulmonary disease) (HCC)   Gangrene of left foot (HCC)   Sepsis likely 2/2 infected chronic venous stasis wound of LLE Tachycardic, fever on admission  ?HCAP Noted nonhealing full-thickness wound to LLE Noted venous insufficiency Afebrile, with mild leukocytosis Procalcitonin 0.16, LA WNL BC x 2 NGTD CXR persistent opacity in the mid right lung possibly pulmonary infarct but possibility of superimposed infection cannot be excluded X-ray of left lower extremity negative for osteo but cannot be excluded WOC on board, appreciate recs for wound dressing Consulted Dr. Lajoyce Corners on 12/08/2022, for evaluation and possible amputation (pt is ok with amputation), rec to transfer to [facility] Continue vancomycin, Flagyl, cefepime Stopped IVF Monitor, transfer order placed  Chronic systolic and diastolic HF Appears ?compensated, on RA BNP 189.5 Chest x-ray as above Echo done 9/23 showed EF of less than 20%, global hypokinesis, ventricular diastolic function could not be evaluated Continue Imdur, hold losartan for now pending BP Strict I's and O's  Hypertension BP stable Continue amlodipine, hold losartan for now pending BP  COPD Appears stable Continue nebs, inhalers  Diabetes mellitus type II Last A1c SSI, Accu-Cheks, hypoglycemic protocol  History of DVT Continue Eliquis  Alcohol/cocaine/tobacco abuse Advised to quit CIWA  Prolonged Qtc Repeat EKG  Bipolar disorder Hold home regimen until QTc has improved        Estimated body mass index is 25.69 kg/m as calculated from the following:   Height as of this encounter: 5\' 9"  (1.753 m).   Weight as of this encounter: 78.9 kg.     Code Status: Full  Family Communication: None at bedside  Disposition Plan: Status is: Inpatient Remains inpatient appropriate because: Level of care      Consultants: Orthopedics Dr.  Procedures: None  Antimicrobials: Vancomycin Cefepime Flagyl  DVT prophylaxis: Eliquis (held)   Objective: Vitals:   12/09/22 0600 12/09/22 0900 12/09/22 1000 12/09/22 1322  BP: 115/86 115/86  123/86  Pulse: 100 98  (!) 105  Resp: 18   19  Temp: 98.2 F (36.8 C)   99.2 F (37.3 C)  TempSrc: Oral   Axillary  SpO2: 98%  98% 97%  Weight:      Height:        Intake/Output Summary (Last 24 hours) at 12/09/2022 1746 Last data  filed at 12/09/2022 0500 Gross per 24 hour  Intake 332.8 ml  Output 1125 ml  Net -792.2 ml   Filed Weights   12/07/22 2200 12/08/22 0154 12/09/22 0500  Weight: 79.3 kg 79 kg 78.9 kg    Exam: General: NAD  Cardiovascular: S1, S2 present Respiratory: Diminished breath sounds bilateral Abdomen: Soft, nontender, nondistended,  bowel sounds present Musculoskeletal: R BKA, LLE wrapped in ACE wrap, C/d/i Skin: Normal Psychiatry: Normal mood     Data Reviewed: CBC: Recent Labs  Lab 12/03/22 0555 12/07/22 0920 12/08/22 0440 12/09/22 0431  WBC 12.5* 11.2* 8.8 8.0  NEUTROABS  --  8.9*  --  5.3  HGB 12.9* 14.1 11.9* 11.5*  HCT 43.4 43.4 35.9* 36.2*  MCV 102.6* 93.5 93.2 94.5  PLT 373 385 277 270   Basic Metabolic Panel: Recent Labs  Lab 12/03/22 0555 12/07/22 0920 12/08/22 0440 12/09/22 0431  NA 135 133* 132* 134*  K 3.9 3.6 3.6 4.1  CL 103 100 103 107  CO2 21* 23 22 20*  GLUCOSE 88 80 112* 127*  BUN 23* 18 14 17   CREATININE 0.90 0.82 0.83 0.86  CALCIUM 8.1* 8.6* 8.1* 8.1*   GFR: Estimated Creatinine Clearance: 98.2 mL/min (by C-G formula based on SCr of 0.86 mg/dL). Liver Function Tests: Recent Labs  Lab 12/03/22 0555 12/08/22 0440  AST 13* 18  ALT 15 14  ALKPHOS 52 51  BILITOT 0.4 1.1  PROT 7.0 5.9*  ALBUMIN 3.0* 2.6*   No results for input(s): "LIPASE", "AMYLASE" in the last 168 hours. No results for input(s): "AMMONIA" in the last 168 hours. Coagulation Profile: Recent Labs  Lab 12/08/22 0440  INR 1.3*   Cardiac Enzymes: No results for input(s): "CKTOTAL", "CKMB", "CKMBINDEX", "TROPONINI" in the last 168 hours. BNP (last 3 results) No results for input(s): "PROBNP" in the last 8760 hours. HbA1C: Recent Labs    12/07/22 2127  HGBA1C 6.1*   CBG: Recent Labs  Lab 12/02/22 2131 12/03/22 1124 12/07/22 1354  GLUCAP 166* 124* 101*   Lipid Profile: No results for input(s): "CHOL", "HDL", "LDLCALC", "TRIG", "CHOLHDL", "LDLDIRECT" in the last 72 hours. Thyroid Function Tests: No results for input(s): "TSH", "T4TOTAL", "FREET4", "T3FREE", "THYROIDAB" in the last 72 hours. Anemia Panel: No results for input(s): "VITAMINB12", "FOLATE", "FERRITIN", "TIBC", "IRON", "RETICCTPCT" in the last 72 hours. Urine analysis:    Component Value Date/Time   COLORURINE YELLOW  08/17/2022 0625   APPEARANCEUR CLEAR 08/17/2022 0625   LABSPEC 1.021 08/17/2022 0625   PHURINE 5.0 08/17/2022 0625   GLUCOSEU NEGATIVE 08/17/2022 0625   HGBUR NEGATIVE 08/17/2022 0625   BILIRUBINUR NEGATIVE 08/17/2022 0625   KETONESUR NEGATIVE 08/17/2022 0625   PROTEINUR NEGATIVE 08/17/2022 0625   NITRITE NEGATIVE 08/17/2022 0625   LEUKOCYTESUR NEGATIVE 08/17/2022 0625   Sepsis Labs: @LABRCNTIP (procalcitonin:4,lacticidven:4)  ) Recent Results (from the past 240 hour(s))  Culture, blood (Routine X 2) w Reflex to ID Panel     Status: None (Preliminary result)   Collection Time: 12/07/22  5:30 PM   Specimen: BLOOD  Result Value Ref Range Status   Specimen Description   Final    BLOOD RIGHT ANTECUBITAL Performed at [facility], [address], [place], [place]    Special Requests   Final    BOTTLES DRAWN AEROBIC AND ANAEROBIC Blood Culture adequate volume Performed at [facility], [address], [place], [place]    Culture   Final    NO GROWTH 2 DAYS  Performed at [facility], [place] [address] [place], [place] [address]    Report Status PENDING  Incomplete  Culture, blood (Routine X 2) w Reflex to ID Panel     Status: None (Preliminary result)   Collection Time: 12/07/22  5:30 PM   Specimen: BLOOD  Result Value Ref Range Status   Specimen Description   Final    BLOOD SITE NOT SPECIFIED Performed at [facility] [address] [place], [place] [address]    Special Requests   Final    BOTTLES DRAWN AEROBIC ONLY Blood Culture results may not be optimal due to an excessive volume of blood received in culture bottles Performed at [facility] [address], [place], [place] [address]    Culture   Final    NO GROWTH 2 DAYS Performed at [facility] [address], [place], [place] [address]    Report Status PENDING  Incomplete      Studies: No results  found.  Scheduled Meds:  amLODipine  10 mg Oral Daily   apixaban  5 mg Oral BID   chlorhexidine  60 mL Topical Once   folic acid  1 mg Oral Daily   gabapentin  900 mg Oral TID   hydrocerin   Topical Daily   isosorbide mononitrate  15 mg Oral Daily   multivitamin with minerals  1 tablet Oral Daily   povidone-iodine  2 Application Topical Once   thiamine  100 mg Oral Daily    Continuous Infusions:  ceFEPime (MAXIPIME) IV 2 g (12/09/22 1527)   metronidazole 500 mg (12/09/22 1722)   vancomycin 1,500 mg (12/09/22 2703)     LOS: 2 days     Alma Friendly, MD [facility]  If 7PM-7AM, please contact night-coverage [email or web address] 12/09/2022, 5:46 PM

## 2022-12-10 ENCOUNTER — Inpatient Hospital Stay (HOSPITAL_COMMUNITY): Payer: Medicaid Other | Admitting: Certified Registered Nurse Anesthetist

## 2022-12-10 ENCOUNTER — Encounter (HOSPITAL_COMMUNITY)
Admission: EM | Disposition: A | Discharge status: Home / Self Care | Payer: Self-pay | Source: Home / Self Care | Attending: Internal Medicine

## 2022-12-10 ENCOUNTER — Encounter (HOSPITAL_COMMUNITY): Payer: Self-pay | Admitting: Internal Medicine

## 2022-12-10 DIAGNOSIS — L03116 Cellulitis of left lower limb: Secondary | ICD-10-CM | POA: Diagnosis not present

## 2022-12-10 DIAGNOSIS — I2510 Atherosclerotic heart disease of native coronary artery without angina pectoris: Secondary | ICD-10-CM | POA: Diagnosis not present

## 2022-12-10 DIAGNOSIS — F1010 Alcohol abuse, uncomplicated: Secondary | ICD-10-CM | POA: Diagnosis not present

## 2022-12-10 DIAGNOSIS — I5040 Unspecified combined systolic (congestive) and diastolic (congestive) heart failure: Secondary | ICD-10-CM | POA: Diagnosis not present

## 2022-12-10 DIAGNOSIS — I10 Essential (primary) hypertension: Secondary | ICD-10-CM

## 2022-12-10 DIAGNOSIS — J449 Chronic obstructive pulmonary disease, unspecified: Secondary | ICD-10-CM

## 2022-12-10 DIAGNOSIS — I110 Hypertensive heart disease with heart failure: Secondary | ICD-10-CM | POA: Diagnosis not present

## 2022-12-10 DIAGNOSIS — F1410 Cocaine abuse, uncomplicated: Secondary | ICD-10-CM

## 2022-12-10 DIAGNOSIS — I509 Heart failure, unspecified: Secondary | ICD-10-CM

## 2022-12-10 DIAGNOSIS — F17210 Nicotine dependence, cigarettes, uncomplicated: Secondary | ICD-10-CM

## 2022-12-10 DIAGNOSIS — E1152 Type 2 diabetes mellitus with diabetic peripheral angiopathy with gangrene: Secondary | ICD-10-CM

## 2022-12-10 DIAGNOSIS — Z86718 Personal history of other venous thrombosis and embolism: Secondary | ICD-10-CM

## 2022-12-10 HISTORY — PX: AMPUTATION: SHX166

## 2022-12-10 LAB — GLUCOSE, CAPILLARY
Glucose-Capillary: 76 mg/dL (ref 70–99)
Glucose-Capillary: 98 mg/dL (ref 70–99)

## 2022-12-10 LAB — CBC WITH DIFFERENTIAL/PLATELET
Abs Immature Granulocytes: 0.03 10*3/uL (ref 0.00–0.07)
Basophils Absolute: 0 10*3/uL (ref 0.0–0.1)
Basophils Relative: 0 %
Eosinophils Absolute: 0.1 10*3/uL (ref 0.0–0.5)
Eosinophils Relative: 1 %
HCT: 33.8 % — ABNORMAL LOW (ref 39.0–52.0)
Hemoglobin: 11.1 g/dL — ABNORMAL LOW (ref 13.0–17.0)
Immature Granulocytes: 0 %
Lymphocytes Relative: 26 %
Lymphs Abs: 2.2 10*3/uL (ref 0.7–4.0)
MCH: 30.6 pg (ref 26.0–34.0)
MCHC: 32.8 g/dL (ref 30.0–36.0)
MCV: 93.1 fL (ref 80.0–100.0)
Monocytes Absolute: 1 10*3/uL (ref 0.1–1.0)
Monocytes Relative: 11 %
Neutro Abs: 5.2 10*3/uL (ref 1.7–7.7)
Neutrophils Relative %: 62 %
Platelets: 258 10*3/uL (ref 150–400)
RBC: 3.63 MIL/uL — ABNORMAL LOW (ref 4.22–5.81)
RDW: 15.8 % — ABNORMAL HIGH (ref 11.5–15.5)
WBC: 8.5 10*3/uL (ref 4.0–10.5)
nRBC: 0 % (ref 0.0–0.2)

## 2022-12-10 LAB — BASIC METABOLIC PANEL
Anion gap: 8 (ref 5–15)
BUN: 16 mg/dL (ref 6–20)
CO2: 20 mmol/L — ABNORMAL LOW (ref 22–32)
Calcium: 8.2 mg/dL — ABNORMAL LOW (ref 8.9–10.3)
Chloride: 106 mmol/L (ref 98–111)
Creatinine, Ser: 0.93 mg/dL (ref 0.61–1.24)
GFR, Estimated: >60 mL/min (ref >60)
Glucose, Bld: 104 mg/dL — ABNORMAL HIGH (ref 70–99)
Potassium: 4.2 mmol/L (ref 3.5–5.1)
Sodium: 134 mmol/L — ABNORMAL LOW (ref 135–145)

## 2022-12-10 LAB — SURGICAL PCR SCREEN
MRSA, PCR: NEGATIVE
Staphylococcus aureus: NEGATIVE

## 2022-12-10 SURGERY — AMPUTATION BELOW KNEE
Anesthesia: General | Site: Knee | Laterality: Left

## 2022-12-10 MED ORDER — LIDOCAINE 2% (20 MG/ML) 5 ML SYRINGE
INTRAMUSCULAR | Status: AC
  Filled 2022-12-10: qty 20

## 2022-12-10 MED ORDER — POTASSIUM CHLORIDE CRYS ER 20 MEQ PO TBCR: 20.0000 meq | EXTENDED_RELEASE_TABLET | Freq: Every day | ORAL | Status: DC | PRN

## 2022-12-10 MED ORDER — ORAL CARE MOUTH RINSE: 15.0000 mL | Freq: Once | OROMUCOSAL | Status: AC

## 2022-12-10 MED ORDER — 0.9 % SODIUM CHLORIDE (POUR BTL) OPTIME
TOPICAL | Status: DC | PRN
  Administered 2022-12-10: 1000 mL

## 2022-12-10 MED ORDER — PHENOL 1.4 % MT LIQD
1.0000 | OROMUCOSAL | Status: DC | PRN
  Administered 2023-02-22: 1 via OROMUCOSAL
  Filled 2022-12-10 (×3): qty 177

## 2022-12-10 MED ORDER — CHLORHEXIDINE GLUCONATE 0.12 % MT SOLN: 15.0000 mL | Freq: Once | OROMUCOSAL | Status: AC

## 2022-12-10 MED ORDER — BUPIVACAINE LIPOSOME 1.3 % IJ SUSP
INTRAMUSCULAR | Status: DC | PRN
  Administered 2022-12-10: 10 mL

## 2022-12-10 MED ORDER — PHENYLEPHRINE 80 MCG/ML (10ML) SYRINGE FOR IV PUSH (FOR BLOOD PRESSURE SUPPORT)
PREFILLED_SYRINGE | INTRAVENOUS | Status: AC
  Filled 2022-12-10: qty 20

## 2022-12-10 MED ORDER — OXYCODONE HCL 5 MG PO TABS
5.0000 mg | ORAL_TABLET | ORAL | Status: DC | PRN
  Filled 2022-12-10: qty 2

## 2022-12-10 MED ORDER — ALBUTEROL SULFATE HFA 108 (90 BASE) MCG/ACT IN AERS
INHALATION_SPRAY | RESPIRATORY_TRACT | Status: DC | PRN
  Administered 2022-12-10: 4 via RESPIRATORY_TRACT

## 2022-12-10 MED ORDER — FENTANYL CITRATE (PF) 100 MCG/2ML IJ SOLN: 100.0000 ug | Freq: Once | INTRAMUSCULAR | Status: AC

## 2022-12-10 MED ORDER — ACETAMINOPHEN 10 MG/ML IV SOLN: 1000.0000 mg | Freq: Once | INTRAVENOUS | Status: DC | PRN

## 2022-12-10 MED ORDER — ONDANSETRON HCL 4 MG/2ML IJ SOLN
INTRAMUSCULAR | Status: DC | PRN
  Administered 2022-12-10: 4 mg via INTRAVENOUS

## 2022-12-10 MED ORDER — MIDAZOLAM HCL 2 MG/2ML IJ SOLN
INTRAMUSCULAR | Status: AC
  Filled 2022-12-10: qty 2

## 2022-12-10 MED ORDER — CHLORHEXIDINE GLUCONATE 0.12 % MT SOLN
OROMUCOSAL | Status: AC
  Administered 2022-12-10: 15 mL via OROMUCOSAL
  Filled 2022-12-10: qty 15

## 2022-12-10 MED ORDER — CEFAZOLIN SODIUM-DEXTROSE 2-4 GM/100ML-% IV SOLN
2.0000 g | INTRAVENOUS | Status: AC
  Administered 2022-12-10: 2 g via INTRAVENOUS
  Filled 2022-12-10: qty 100

## 2022-12-10 MED ORDER — MIDAZOLAM HCL 2 MG/2ML IJ SOLN
INTRAMUSCULAR | Status: DC | PRN
  Administered 2022-12-10: 2 mg via INTRAVENOUS

## 2022-12-10 MED ORDER — ZINC SULFATE 220 (50 ZN) MG PO CAPS
220.0000 mg | ORAL_CAPSULE | Freq: Every day | ORAL | Status: AC
  Administered 2022-12-10 – 2022-12-23 (×12): 220 mg via ORAL
  Filled 2022-12-10 (×13): qty 1

## 2022-12-10 MED ORDER — BISACODYL 5 MG PO TBEC: 5.0000 mg | DELAYED_RELEASE_TABLET | Freq: Every day | ORAL | Status: DC | PRN

## 2022-12-10 MED ORDER — MIDAZOLAM HCL 2 MG/2ML IJ SOLN: 2.0000 mg | Freq: Once | INTRAMUSCULAR | Status: AC

## 2022-12-10 MED ORDER — FENTANYL CITRATE (PF) 250 MCG/5ML IJ SOLN
INTRAMUSCULAR | Status: AC
  Filled 2022-12-10: qty 5

## 2022-12-10 MED ORDER — PHENYLEPHRINE 80 MCG/ML (10ML) SYRINGE FOR IV PUSH (FOR BLOOD PRESSURE SUPPORT)
PREFILLED_SYRINGE | INTRAVENOUS | Status: DC | PRN
  Administered 2022-12-10 (×2): 160 ug via INTRAVENOUS

## 2022-12-10 MED ORDER — ACETAMINOPHEN 325 MG PO TABS: 325.0000 mg | ORAL_TABLET | Freq: Four times a day (QID) | ORAL | Status: DC | PRN

## 2022-12-10 MED ORDER — TRANEXAMIC ACID-NACL 1000-0.7 MG/100ML-% IV SOLN
1000.0000 mg | INTRAVENOUS | Status: AC
  Administered 2022-12-10: 1000 mg via INTRAVENOUS
  Filled 2022-12-10: qty 100

## 2022-12-10 MED ORDER — CEFAZOLIN SODIUM-DEXTROSE 2-4 GM/100ML-% IV SOLN: 2.0000 g | Freq: Three times a day (TID) | INTRAVENOUS | Status: DC

## 2022-12-10 MED ORDER — SUCCINYLCHOLINE CHLORIDE 200 MG/10ML IV SOSY
PREFILLED_SYRINGE | INTRAVENOUS | Status: DC | PRN
  Administered 2022-12-10: 120 mg via INTRAVENOUS

## 2022-12-10 MED ORDER — DOCUSATE SODIUM 100 MG PO CAPS
100.0000 mg | ORAL_CAPSULE | Freq: Every day | ORAL | Status: DC
  Administered 2022-12-11 – 2023-02-01 (×18): 100 mg via ORAL
  Filled 2022-12-10 (×42): qty 1

## 2022-12-10 MED ORDER — EPHEDRINE 5 MG/ML INJ
INTRAVENOUS | Status: AC
  Filled 2022-12-10: qty 10

## 2022-12-10 MED ORDER — FENTANYL CITRATE (PF) 100 MCG/2ML IJ SOLN
INTRAMUSCULAR | Status: AC
  Administered 2022-12-10: 25 ug via INTRAVENOUS
  Filled 2022-12-10: qty 2

## 2022-12-10 MED ORDER — HYDROMORPHONE HCL 1 MG/ML IJ SOLN: 0.2500 mg | INTRAMUSCULAR | Status: DC | PRN

## 2022-12-10 MED ORDER — ACETAMINOPHEN 325 MG PO TABS: 325.0000 mg | ORAL_TABLET | Freq: Once | ORAL | Status: DC | PRN

## 2022-12-10 MED ORDER — BUPIVACAINE HCL (PF) 0.5 % IJ SOLN
INTRAMUSCULAR | Status: DC | PRN
  Administered 2022-12-10: 20 mL via PERINEURAL

## 2022-12-10 MED ORDER — ALUM & MAG HYDROXIDE-SIMETH 200-200-20 MG/5ML PO SUSP
15.0000 mL | ORAL | Status: DC | PRN
  Administered 2023-04-18: 30 mL via ORAL
  Filled 2022-12-10: qty 30

## 2022-12-10 MED ORDER — MEPERIDINE HCL 25 MG/ML IJ SOLN: 6.2500 mg | INTRAMUSCULAR | Status: DC | PRN

## 2022-12-10 MED ORDER — ONDANSETRON HCL 4 MG/2ML IJ SOLN
INTRAMUSCULAR | Status: AC
  Filled 2022-12-10: qty 10

## 2022-12-10 MED ORDER — JUVEN PO PACK
1.0000 | PACK | Freq: Two times a day (BID) | ORAL | Status: DC
  Administered 2022-12-11 – 2023-01-01 (×20): 1 via ORAL
  Filled 2022-12-10 (×27): qty 1

## 2022-12-10 MED ORDER — ACETAMINOPHEN 160 MG/5ML PO SOLN: 325.0000 mg | Freq: Once | ORAL | Status: DC | PRN

## 2022-12-10 MED ORDER — GUAIFENESIN-DM 100-10 MG/5ML PO SYRP: 15.0000 mL | ORAL_SOLUTION | ORAL | Status: DC | PRN

## 2022-12-10 MED ORDER — CEFAZOLIN SODIUM 1 G IJ SOLR
INTRAMUSCULAR | Status: AC
  Filled 2022-12-10: qty 20

## 2022-12-10 MED ORDER — MAGNESIUM CITRATE PO SOLN: 1.0000 | Freq: Once | ORAL | Status: DC | PRN

## 2022-12-10 MED ORDER — ONDANSETRON HCL 4 MG/2ML IJ SOLN: 4.0000 mg | Freq: Four times a day (QID) | INTRAMUSCULAR | Status: DC | PRN

## 2022-12-10 MED ORDER — PROMETHAZINE HCL 25 MG/ML IJ SOLN: 6.2500 mg | INTRAMUSCULAR | Status: DC | PRN

## 2022-12-10 MED ORDER — MIDAZOLAM HCL 2 MG/2ML IJ SOLN
INTRAMUSCULAR | Status: AC
  Administered 2022-12-10: 2 mg via INTRAVENOUS
  Filled 2022-12-10: qty 2

## 2022-12-10 MED ORDER — DEXAMETHASONE SODIUM PHOSPHATE 10 MG/ML IJ SOLN
INTRAMUSCULAR | Status: AC
  Filled 2022-12-10: qty 4

## 2022-12-10 MED ORDER — SODIUM CHLORIDE 0.9 % IV SOLN: INTRAVENOUS | Status: DC

## 2022-12-10 MED ORDER — METOPROLOL TARTRATE 5 MG/5ML IV SOLN: 2.0000 mg | INTRAVENOUS | Status: DC | PRN

## 2022-12-10 MED ORDER — LACTATED RINGERS IV SOLN: INTRAVENOUS | Status: DC

## 2022-12-10 MED ORDER — LABETALOL HCL 5 MG/ML IV SOLN: 10.0000 mg | INTRAVENOUS | Status: DC | PRN

## 2022-12-10 MED ORDER — POLYETHYLENE GLYCOL 3350 17 G PO PACK: 17.0000 g | PACK | Freq: Every day | ORAL | Status: DC | PRN

## 2022-12-10 MED ORDER — DEXAMETHASONE SODIUM PHOSPHATE 10 MG/ML IJ SOLN
INTRAMUSCULAR | Status: DC | PRN
  Administered 2022-12-10: 5 mg via INTRAVENOUS

## 2022-12-10 MED ORDER — AMISULPRIDE (ANTIEMETIC) 5 MG/2ML IV SOLN: 10.0000 mg | Freq: Once | INTRAVENOUS | Status: DC | PRN

## 2022-12-10 MED ORDER — FENTANYL CITRATE (PF) 100 MCG/2ML IJ SOLN: 25.0000 ug | Freq: Once | INTRAMUSCULAR | Status: AC

## 2022-12-10 MED ORDER — FENTANYL CITRATE (PF) 100 MCG/2ML IJ SOLN
INTRAMUSCULAR | Status: AC
  Administered 2022-12-10: 100 ug via INTRAVENOUS
  Filled 2022-12-10: qty 2

## 2022-12-10 MED ORDER — LIDOCAINE 2% (20 MG/ML) 5 ML SYRINGE
INTRAMUSCULAR | Status: DC | PRN
  Administered 2022-12-10: 40 mg via INTRAVENOUS

## 2022-12-10 MED ORDER — HYDRALAZINE HCL 20 MG/ML IJ SOLN: 5.0000 mg | INTRAMUSCULAR | Status: DC | PRN

## 2022-12-10 MED ORDER — HYDROMORPHONE HCL 1 MG/ML IJ SOLN
0.5000 mg | INTRAMUSCULAR | Status: DC | PRN
  Administered 2022-12-10: 0.5 mg via INTRAVENOUS
  Administered 2022-12-11 (×2): 1 mg via INTRAVENOUS
  Filled 2022-12-10 (×3): qty 1

## 2022-12-10 MED ORDER — ROPIVACAINE HCL 5 MG/ML IJ SOLN
INTRAMUSCULAR | Status: DC | PRN
  Administered 2022-12-10: 15 mL via PERINEURAL

## 2022-12-10 MED ORDER — OXYCODONE HCL 5 MG PO TABS
10.0000 mg | ORAL_TABLET | ORAL | Status: DC | PRN
  Administered 2022-12-10 – 2022-12-13 (×10): 15 mg via ORAL
  Administered 2022-12-13: 10 mg via ORAL
  Administered 2022-12-13 – 2022-12-19 (×22): 15 mg via ORAL
  Filled 2022-12-10 (×36): qty 3

## 2022-12-10 MED ORDER — SUCCINYLCHOLINE CHLORIDE 200 MG/10ML IV SOSY
PREFILLED_SYRINGE | INTRAVENOUS | Status: AC
  Filled 2022-12-10: qty 10

## 2022-12-10 MED ORDER — MAGNESIUM SULFATE 2 GM/50ML IV SOLN: 2.0000 g | Freq: Every day | INTRAVENOUS | Status: DC | PRN

## 2022-12-10 MED ORDER — VITAMIN C 500 MG PO TABS
1000.0000 mg | ORAL_TABLET | Freq: Every day | ORAL | Status: DC
  Administered 2022-12-10 – 2023-05-24 (×159): 1000 mg via ORAL
  Filled 2022-12-10 (×175): qty 2

## 2022-12-10 MED ORDER — PROPOFOL 10 MG/ML IV BOLUS
INTRAVENOUS | Status: DC | PRN
  Administered 2022-12-10: 200 mg via INTRAVENOUS

## 2022-12-10 MED ORDER — PANTOPRAZOLE SODIUM 40 MG PO TBEC
40.0000 mg | DELAYED_RELEASE_TABLET | Freq: Every day | ORAL | Status: DC
  Administered 2022-12-10 – 2023-03-27 (×59): 40 mg via ORAL
  Filled 2022-12-10 (×100): qty 1

## 2022-12-10 SURGICAL SUPPLY — 45 items
BAG COUNTER SPONGE SURGICOUNT (BAG) IMPLANT
BAG SPNG CNTER NS LX DISP (BAG)
BIT DRILL 3.2XOCPTL (BIT) ×1 IMPLANT
BIT DRL 3.2XOCPTL (BIT)
BLADE SAW RECIP 87.9 MT (BLADE) ×1 IMPLANT
BLADE SURG 21 STRL SS (BLADE) ×1 IMPLANT
BNDG COHESIVE 6X5 TAN NS LF (GAUZE/BANDAGES/DRESSINGS) IMPLANT
BNDG COHESIVE 6X5 TAN STRL LF (GAUZE/BANDAGES/DRESSINGS) IMPLANT
CANISTER WOUND CARE 500ML ATS (WOUND CARE) ×1 IMPLANT
COVER SURGICAL LIGHT HANDLE (MISCELLANEOUS) ×1 IMPLANT
CUFF TOURN SGL QUICK 34 (TOURNIQUET CUFF)
CUFF TRNQT CYL 34X4.125X (TOURNIQUET CUFF) ×1 IMPLANT
DRAPE DERMATAC (DRAPES) IMPLANT
DRAPE INCISE IOBAN 66X45 STRL (DRAPES) ×1 IMPLANT
DRAPE U-SHAPE 47X51 STRL (DRAPES) ×1 IMPLANT
DRESSING PREVENA PLUS CUSTOM (GAUZE/BANDAGES/DRESSINGS) ×1 IMPLANT
DRILL BIT (BIT)
DRSG PREVENA PLUS CUSTOM (GAUZE/BANDAGES/DRESSINGS) ×1
DURAPREP 26ML APPLICATOR (WOUND CARE) ×1 IMPLANT
ELECT REM PT RETURN 9FT ADLT (ELECTROSURGICAL) ×1
ELECTRODE REM PT RTRN 9FT ADLT (ELECTROSURGICAL) ×1 IMPLANT
GLOVE BIOGEL PI IND STRL 9 (GLOVE) ×1 IMPLANT
GLOVE SURG ORTHO 9.0 STRL STRW (GLOVE) ×1 IMPLANT
GOWN STRL REUS W/ TWL XL LVL3 (GOWN DISPOSABLE) ×2 IMPLANT
GOWN STRL REUS W/TWL XL LVL3 (GOWN DISPOSABLE) ×2
GRAFT SKIN WND MICRO 38 (Tissue) IMPLANT
GRAFT SKIN WND OMEGA3 SB 7X10 (Tissue) IMPLANT
KIT BASIN OR (CUSTOM PROCEDURE TRAY) ×1 IMPLANT
KIT DRSG PREVENA PLUS 7DAY 125 (MISCELLANEOUS) IMPLANT
KIT TURNOVER KIT B (KITS) ×1 IMPLANT
MANIFOLD NEPTUNE II (INSTRUMENTS) ×1 IMPLANT
NS IRRIG 1000ML POUR BTL (IV SOLUTION) ×1 IMPLANT
PACK ORTHO EXTREMITY (CUSTOM PROCEDURE TRAY) ×1 IMPLANT
PAD ARMBOARD 7.5X6 YLW CONV (MISCELLANEOUS) ×1 IMPLANT
PREVENA RESTOR ARTHOFORM 46X30 (CANNISTER) ×1 IMPLANT
SPONGE T-LAP 18X18 ~~LOC~~+RFID (SPONGE) IMPLANT
STAPLER VISISTAT 35W (STAPLE) IMPLANT
STOCKINETTE IMPERVIOUS LG (DRAPES) ×1 IMPLANT
SUT ETHILON 2 0 PSLX (SUTURE) IMPLANT
SUT SILK 2 0 (SUTURE) ×1
SUT SILK 2-0 18XBRD TIE 12 (SUTURE) ×1 IMPLANT
SUT VIC AB 1 CTX 27 (SUTURE) ×2 IMPLANT
TOWEL GREEN STERILE (TOWEL DISPOSABLE) ×1 IMPLANT
TUBE CONNECTING 12X1/4 (SUCTIONS) ×1 IMPLANT
YANKAUER SUCT BULB TIP NO VENT (SUCTIONS) ×1 IMPLANT

## 2022-12-10 NOTE — Progress Notes (Signed)
Inpatient Rehabilitation Admissions Coordinator   Rehab consult received. Patient previously unhoused, living at [facility], at wheelchair level. Not a candidate for CIR rehab. Other rehab venues should be pursued.We will sign off.  Danne Baxter, RN, MSN Rehab Admissions Coordinator [phone] 12/10/2022 6:21 PM

## 2022-12-10 NOTE — Op Note (Signed)
12/10/2022  4:29 PM  PATIENT:  [name]    PRE-OPERATIVE DIAGNOSIS:  Gangrene Left Foot  POST-OPERATIVE DIAGNOSIS:  Same  PROCEDURE:  LEFT BELOW KNEE AMPUTATION Application of Kerecis micro graft 38 cm and Kerecis sheet 7 x 10 cm. Application of Prevena customizable and Prevena arthroform wound VAC dressings Application of Vive Wear stump shrinker and the H[employer] limb protector  SURGEON:  Newt Minion, MD  ANESTHESIA:   General  PREOPERATIVE INDICATIONS:  [name] is a  57 y.o. male with a diagnosis of Gangrene Left Foot who failed conservative measures and elected for surgical management.    The risks benefits and alternatives were discussed with the patient preoperatively including but not limited to the risks of infection, bleeding, nerve injury, cardiopulmonary complications, the need for revision surgery, among others, and the patient was willing to proceed.  OPERATIVE IMPLANTS: Kerecis micro graft 38 cm and Kerecis sheet 7 x 10 cm.   OPERATIVE FINDINGS: Calcified arteries muscle had good petechial bleeding.  OPERATIVE PROCEDURE: Patient was brought to the operating room after undergoing a regional anesthetic.  After adequate levels anesthesia were obtained a thigh tourniquet was placed and the lower extremity was prepped using DuraPrep draped into a sterile field. The foot was draped out of the sterile field with impervious stockinette.  A timeout was called and the tourniquet inflated.  A transverse skin incision was made 12 cm distal to the tibial tubercle, the incision curved proximally, and a large posterior flap was created.  The tibia was transected just proximal to the skin incision and beveled anteriorly.  The fibula was transected just proximal to the tibial incision.  The sciatic nerve was pulled cut and allowed to retract.  The vascular bundles were suture ligated with 2-0 silk.  The tourniquet was deflated and hemostasis obtained.    Drill holes were  placed through the tibia and fibula to secure the [facility] tissue graft and the gastrocnemius fascia.    The Kerecis micro powder 38 cm was applied to the open wound that has a 200 cm surface area.  The 7 x 10 cm Kerecis sheet was then folded and secured to the distal tibia and fibula with #1 Vicryl.  A separate drill hole was then used to secure the [facility] tissue graft and the gastrocnemius fascia to the dorsum of the tibia.    The deep and superficial fascial layers were closed using #1 Vicryl.  The skin was closed using staples.    The Prevena customizable dressing was applied this was overwrapped with the arthroform sponge.  [name] was used to secure the sponges and the circumferential compression was secured to the skin with Dermatac.  This was connected to the wound VAC pump and had a good suction fit this was covered with a stump shrinker and a limb protector.  Patient was taken to the PACU in stable condition.   DISCHARGE PLANNING:  Antibiotic duration: 24-hour antibiotics  Weightbearing: Nonweightbearing on the operative extremity  Pain medication: Opioid pathway  Dressing care/ Wound VAC: Continue wound VAC with the Prevena plus pump at discharge for 1 week  Ambulatory devices: Walker or kneeling scooter  Discharge to: Discharge planning based on recommendations per physical therapy  Follow-up: In the office 1 week after discharge.

## 2022-12-10 NOTE — Anesthesia Procedure Notes (Signed)
Procedure Name: Intubation Date/Time: 12/10/2022 3:38 PM  Performed by: Effie Berkshire, MDPre-anesthesia Checklist: Patient identified, Emergency Drugs available, Suction available and Patient being monitored Patient Re-evaluated:Patient Re-evaluated prior to induction Oxygen Delivery Method: Circle system utilized Preoxygenation: Pre-oxygenation with 100% oxygen Induction Type: IV induction Ventilation: Mask ventilation without difficulty and Oral airway inserted - appropriate to patient size Laryngoscope Size: Mac and 4 Grade View: Grade I Tube type: Oral Tube size: 7.5 mm Number of attempts: 1 Airway Equipment and Method: Stylet Placement Confirmation: ETT inserted through vocal cords under direct vision, positive ETCO2 and breath sounds checked- equal and bilateral Secured at: 22 cm Tube secured with: Tape Dental Injury: Teeth and Oropharynx as per pre-operative assessment

## 2022-12-10 NOTE — Anesthesia Preprocedure Evaluation (Addendum)
Anesthesia Evaluation  Patient identified by MRN, date of birth, ID band  Reviewed: Allergy & Precautions, NPO status , Patient's Chart, lab work & pertinent test results  Airway Mallampati: II  TM Distance: >3 FB Neck ROM: Full    Dental  (+) Missing, Dental Advisory Given   Pulmonary asthma , COPD, Current Smoker and Patient abstained from smoking.    + decreased breath sounds+ wheezing      Cardiovascular hypertension, + CAD and +CHF   Rhythm:Regular Rate:Normal     Neuro/Psych Seizures -,  PSYCHIATRIC DISORDERS  Depression Bipolar Disorder    Neuromuscular disease CVA    GI/Hepatic ,GERD  ,,(+) Hepatitis -  Endo/Other  diabetes    Renal/GU negative Renal ROS     Musculoskeletal negative musculoskeletal ROS (+)    Abdominal   Peds  Hematology   Anesthesia Other Findings   Reproductive/Obstetrics                             Anesthesia Physical Anesthesia Plan  ASA: 3  Anesthesia Plan: General   Post-op Pain Management:    Induction: Intravenous  PONV Risk Score and Plan: 2 and Ondansetron and Midazolam  Airway Management Planned: LMA  Additional Equipment: None  Intra-op Plan:   Post-operative Plan: Extubation in OR  Informed Consent: I have reviewed the patients History and Physical, chart, labs and discussed the procedure including the risks, benefits and alternatives for the proposed anesthesia with the patient or authorized representative who has indicated his/her understanding and acceptance.       Plan Discussed with: CRNA  Anesthesia Plan Comments: (Breathing tx administered in Short Stay)       Anesthesia Quick Evaluation

## 2022-12-10 NOTE — Interval H&P Note (Signed)
History and Physical Interval Note:  12/10/2022 6:59 AM  [name]  has presented today for surgery, with the diagnosis of Gangrene Left Foot.  The various methods of treatment have been discussed with the patient and family. After consideration of risks, benefits and other options for treatment, the patient has consented to  Procedure(s): LEFT BELOW KNEE AMPUTATION (Left) as a surgical intervention.  The patient's history has been reviewed, patient examined, no change in status, stable for surgery.  I have reviewed the patient's chart and labs.  Questions were answered to the patient's satisfaction.     Newt Minion

## 2022-12-10 NOTE — Progress Notes (Signed)
Pharmacy Antibiotic Note  [name] is a 57 y.o. male admitted on 12/07/2022 with chronic venous stasis wound of the LLE, recently prescribed Augmentin. Planning for transfer to [facility] for left BKA. Remains on broad spectrum antibiotics - pharmacy consulted to dose cefepime and vancomycin.   Today, 12/10/22 WBC WNL SCr stable, WNL  Today is day #4 of IV antibiotics.   Plan: Continue cefepime 2 g IV q8h + metronidazole 500 mg IV q12h Continue vancomycin 1500 mg IV q12h for estimated AUC of ~550. Goal AUC 400-550.  Monitor renal function, vanc levels as needed.   Follow for antibiotic de-escalation and LOT post-amputation.  Height: 5\' 9"  (175.3 cm) Weight: 79.3 kg (174 lb 13.2 oz) IBW/kg (Calculated) : 70.7  Temp (24hrs), Avg:98.9 F (37.2 C), Min:98.6 F (37 C), Max:99.2 F (37.3 C)  Recent Labs  Lab 12/07/22 0920 12/07/22 1730 12/07/22 2127 12/08/22 0440 12/09/22 0431 12/10/22 0422  WBC 11.2*  --   --  8.8 8.0 8.5  CREATININE 0.82  --   --  0.83 0.86 0.93  LATICACIDVEN  --  1.0 1.7  --   --   --     Estimated Creatinine Clearance: 90.8 mL/min (by C-G formula based on SCr of 0.93 mg/dL).    Allergies  Allergen Reactions   Motrin [Ibuprofen] Anaphylaxis   Tylenol [Acetaminophen] Hives, Nausea And Vomiting and Rash   Lyrica Cr [Pregabalin Er] Other (See Comments)    Dystonia Dysarthria    Peanut-Containing Drug Products Other (See Comments)    Pt denies allergy to peanut butter 03/02/2021    Antimicrobials this admission: cefepime 1/7 >>  metronidazole 1/7 >>  Vancomycin 1/7 >>  Dose adjustments this admission:  Microbiology results: 1/7 BCx: ngtd  Lenis Noon, PharmD 12/10/2022 10:06 AM

## 2022-12-10 NOTE — Anesthesia Procedure Notes (Signed)
Anesthesia Regional Block: Adductor canal block   Pre-Anesthetic Checklist: , timeout performed,  Correct Patient, Correct Site, Correct Laterality,  Correct Procedure, Correct Position, site marked,  Risks and benefits discussed,  Surgical consent,  Pre-op evaluation,  At surgeon's request and post-op pain management  Laterality: Left  Prep: chloraprep       Needles:  Injection technique: Single-shot  Needle Type: Echogenic Stimulator Needle     Needle Length: 9cm  Needle Gauge: 21     Additional Needles:   Procedures:,,,, ultrasound used (permanent image in chart),,    Narrative:  Start time: 12/10/2022 2:20 PM End time: 12/10/2022 2:25 PM Injection made incrementally with aspirations every 5 mL.  Performed by: Personally  Anesthesiologist: Effie Berkshire, MD  Additional Notes: Discussed risks and benefits of the nerve block in detail, including but not limited vascular injury, permanent nerve damage and infection.   Patient tolerated the procedure well. Local anesthetic introduced in an incremental fashion under minimal resistance after negative aspirations. No paresthesias were elicited. After completion of the procedure, no acute issues were identified and patient continued to be monitored by RN.

## 2022-12-10 NOTE — Transfer of Care (Signed)
Immediate Anesthesia Transfer of Care Note  Patient: Kobee Medlen  Procedure(s) Performed: LEFT BELOW KNEE AMPUTATION (Left: Knee)  Patient Location: PACU  Anesthesia Type:General  Level of Consciousness: drowsy and patient cooperative  Airway & Oxygen Therapy: Patient Spontanous Breathing and Patient connected to face mask oxygen  Post-op Assessment: Report given to RN, Post -op Vital signs reviewed and stable, and Patient moving all extremities X 4  Post vital signs: Reviewed and stable  Last Vitals:  Vitals Value Taken Time  BP 142/112 12/10/22 1615  Temp    Pulse 91   Resp 34 12/10/22 1618  SpO2 100   Vitals shown include unvalidated device data.  Last Pain:  Vitals:   12/10/22 0800  TempSrc:   PainSc: 0-No pain      Patients Stated Pain Goal: 3 (09/98/33 8250)  Complications: No notable events documented.

## 2022-12-10 NOTE — Progress Notes (Signed)
Patients wheelchair and personal belongings left at [facility] due to [facility] not being able to transport wheelchair. Patient stated he wanted all of his items to stay together. Will await for bed assignment at [facility] to find arrangements for patient to receive personal belongings.

## 2022-12-10 NOTE — Plan of Care (Signed)
  Problem: Fluid Volume: Goal: Hemodynamic stability will improve Outcome: Progressing   Problem: Clinical Measurements: Goal: Diagnostic test results will improve Outcome: Progressing Goal: Signs and symptoms of infection will decrease Outcome: Progressing   Problem: Respiratory: Goal: Ability to maintain adequate ventilation will improve Outcome: Progressing   Problem: Education: Goal: Knowledge of General Education information will improve Description: Including pain rating scale, medication(s)/side effects and non-pharmacologic comfort measures Outcome: Progressing   Problem: Clinical Measurements: Goal: Ability to maintain clinical measurements within normal limits will improve Outcome: Progressing Goal: Will remain free from infection Outcome: Progressing Goal: Diagnostic test results will improve Outcome: Progressing Goal: Respiratory complications will improve Outcome: Progressing Goal: Cardiovascular complication will be avoided Outcome: Progressing   Problem: Activity: Goal: Risk for activity intolerance will decrease Outcome: Progressing   Problem: Nutrition: Goal: Adequate nutrition will be maintained Outcome: Progressing   Problem: Coping: Goal: Level of anxiety will decrease Outcome: Progressing   Problem: Elimination: Goal: Will not experience complications related to bowel motility Outcome: Progressing Goal: Will not experience complications related to urinary retention Outcome: Progressing   Problem: Pain Managment: Goal: General experience of comfort will improve Outcome: Progressing

## 2022-12-10 NOTE — Progress Notes (Signed)
[facility]                                                                               [name], is a 57 y.o. male, DOB - [birth year], OVF:[id number] Admit date - 12/07/2022    Outpatient Primary MD for the patient is [name], [name], NP  LOS - 3  days    Brief summary    [name] is a 57 y.o. male with medical history significant of chronic combined HF, CAD, HLD, polyneuropathy, bipolar d/o, COPD. Presenting with LLE chronic venous stasis wound pain. Pt was recently admitted for LLE wound infection. At the time he was seen by podiatry who recommended that he continue augmentin and outpt wound care. Pt reports compliance with the medication since discharge. However, he has had increasing pain since discharge. Of note, pt is homeless. Pt admitted for further management.   Assessment & Plan    Assessment and Plan:  Sepsis secondary to infected chronic venous stasis wounds of the left lower extremity. Orthopedics consulted and patient is scheduled for amputation of the left lower extremity. Patient was empirically started on broad-spectrum IV antibiotics continue the same patient will be transferred to [facility] for the procedure.     History of chronic systolic and diastolic heart failure Patient appears to be compensated at this time he is on room air denies any shortness of breath or chest pain. Echocardiogram in September 2023 showed left ventricular ejection fraction of less than 20% with global hypokinesis.   COPD No wheezing heard on exam today. Continue with nebulizers, bronchodilators.    Type 2 diabetes mellitus Continue with sliding scale insulin at this time.     History of DVT Patient is on Eliquis for anticoagulation.     Hypertension Blood pressure parameters are optimal.     History of alcohol/cocaine/tobacco abuse currently no withdrawal signs at this time.  Counseled against use.   Estimated body mass index is  25.82 kg/m as calculated from the following:   Height as of this encounter: 5\' 9"  (1.753 m).   Weight as of this encounter: 79.3 kg.  Code Status: full code.  DVT Prophylaxis:     Level of Care: Level of care: Telemetry Surgical Family Communication: None at bedside.   Disposition Plan:     Remains inpatient appropriate:  further eval by ortho.   Procedures:  None.   Consultants:   Orthopedics.   Antimicrobials:   Anti-infectives (From admission, onward)    Start     Dose/Rate Route Frequency Ordered Stop   12/10/22 1330  ceFAZolin (ANCEF) IVPB 2g/100 mL premix        2 g 200 mL/hr over 30 Minutes Intravenous On call to O.R. 12/10/22 0959 12/11/22 0559   12/08/22 0530  [MAR Hold]  vancomycin (VANCOREADY) IVPB 1500 mg/300 mL        (MAR Hold since Wed 12/10/2022 at 1255.Hold Reason: Transfer to a Procedural area)   1,500 mg 150 mL/hr over 120 Minutes Intravenous Every 12 hours 12/07/22 1741     12/08/22 0345  [MAR Hold]  metroNIDAZOLE (FLAGYL) IVPB 500 mg        (MAR  Hold since Wed 12/10/2022 at 1255.Hold Reason: Transfer to a Procedural area)   500 mg 100 mL/hr over 60 Minutes Intravenous Every 12 hours 12/07/22 1742     12/07/22 2330  [MAR Hold]  ceFEPIme (MAXIPIME) 2 g in sodium chloride 0.9 % 100 mL IVPB        (MAR Hold since Wed 12/10/2022 at 1255.Hold Reason: Transfer to a Procedural area)   2 g 200 mL/hr over 30 Minutes Intravenous Every 8 hours 12/07/22 1741     12/07/22 2200  metroNIDAZOLE (FLAGYL) IVPB 500 mg  Status:  Discontinued        500 mg 100 mL/hr over 60 Minutes Intravenous Every 12 hours 12/07/22 2048 12/07/22 2056   12/07/22 1530  ceFEPIme (MAXIPIME) 2 g in sodium chloride 0.9 % 100 mL IVPB        2 g 200 mL/hr over 30 Minutes Intravenous  Once 12/07/22 1516 12/07/22 1734   12/07/22 1530  vancomycin (VANCOREADY) IVPB 1500 mg/300 mL        1,500 mg 150 mL/hr over 120 Minutes Intravenous  Once 12/07/22 1516 12/09/22 0333   12/07/22 1515  metroNIDAZOLE  (FLAGYL) IVPB 500 mg        500 mg 100 mL/hr over 60 Minutes Intravenous  Once 12/07/22 1506 12/07/22 1734   12/07/22 1500  ceFAZolin (ANCEF) IVPB 1 g/50 mL premix  Status:  Discontinued        1 g 100 mL/hr over 30 Minutes Intravenous  Once 12/07/22 1452 12/07/22 1513        Medications  Scheduled Meds:  [MAR Hold] amLODipine  10 mg Oral Daily   chlorhexidine  60 mL Topical Once   chlorhexidine       [MAR Hold] folic acid  1 mg Oral Daily   [MAR Hold] gabapentin  900 mg Oral TID   [MAR Hold] hydrocerin   Topical Daily   [MAR Hold] isosorbide mononitrate  15 mg Oral Daily   [MAR Hold] multivitamin with minerals  1 tablet Oral Daily   povidone-iodine  2 Application Topical Once   [MAR Hold] thiamine  100 mg Oral Daily   Continuous Infusions:   ceFAZolin (ANCEF) IV     [MAR Hold] ceFEPime (MAXIPIME) IV 2 g (12/10/22 0921)   [MAR Hold] metronidazole 500 mg (12/10/22 0320)   tranexamic acid     [MAR Hold] vancomycin 1,500 mg (12/10/22 0448)   PRN Meds:.[MAR Hold] albuterol, chlorhexidine, [MAR Hold] LORazepam **OR** [MAR Hold] LORazepam, [MAR Hold]  morphine injection, [MAR Hold] oxyCODONE    Subjective:   [name] was seen and examined today.  No new complaints.   Objective:   Vitals:   12/10/22 0400 12/10/22 0500 12/10/22 0537 12/10/22 0800  BP:   (!) 126/91   Pulse: (!) 102  (!) 104   Resp:   20   Temp:   98.9 F (37.2 C)   TempSrc:   Oral   SpO2:   100% 100%  Weight:  79.3 kg    Height:        Intake/Output Summary (Last 24 hours) at 12/10/2022 1341 Last data filed at 12/10/2022 1108 Gross per 24 hour  Intake 143.4 ml  Output 2875 ml  Net -2731.6 ml   Filed Weights   12/08/22 0154 12/09/22 0500 12/10/22 0500  Weight: 79 kg 78.9 kg 79.3 kg     Exam General exam: Appears calm and comfortable  Respiratory system: Clear to auscultation. Respiratory effort normal. Cardiovascular system: S1 &  S2 heard, RRR. No JVD,  Gastrointestinal system:  Abdomen is nondistended, soft and nontender. Central nervous system: Alert and oriented. No focal neurological deficits. Extremities: Right BKA, left lower extremity wrapped in Ace wrap Skin: No rashes, lesions or ulcers Psychiatry: Mood & affect appropriate.    Data Reviewed:  I have personally reviewed following labs and imaging studies   CBC Lab Results  Component Value Date   WBC 8.5 12/10/2022   RBC 3.63 (L) 12/10/2022   HGB 11.1 (L) 12/10/2022   HCT 33.8 (L) 12/10/2022   MCV 93.1 12/10/2022   MCH 30.6 12/10/2022   PLT 258 12/10/2022   MCHC 32.8 12/10/2022   RDW 15.8 (H) 12/10/2022   LYMPHSABS 2.2 12/10/2022   MONOABS 1.0 12/10/2022   EOSABS 0.1 12/10/2022   BASOSABS 0.0 57/84/6962     Last metabolic panel Lab Results  Component Value Date   NA 134 (L) 12/10/2022   K 4.2 12/10/2022   CL 106 12/10/2022   CO2 20 (L) 12/10/2022   BUN 16 12/10/2022   CREATININE 0.93 12/10/2022   GLUCOSE 104 (H) 12/10/2022   GFRNONAA >60 12/10/2022   GFRAA >60 08/21/2020   CALCIUM 8.2 (L) 12/10/2022   PHOS 4.7 (H) 07/18/2022   PROT 5.9 (L) 12/08/2022   ALBUMIN 2.6 (L) 12/08/2022   BILITOT 1.1 12/08/2022   ALKPHOS 51 12/08/2022   AST 18 12/08/2022   ALT 14 12/08/2022   ANIONGAP 8 12/10/2022    CBG (last 3)  Recent Labs    12/07/22 1354  GLUCAP 101*      Coagulation Profile: Recent Labs  Lab 12/08/22 0440  INR 1.3*     Radiology Studies: No results found.     Hosie Poisson M.D. [facility] 12/10/2022, 1:41 PM  Available via Epic secure chat 7am-7pm After 7 pm, please refer to night coverage provider listed on amion.

## 2022-12-10 NOTE — Anesthesia Procedure Notes (Signed)
Anesthesia Regional Block: Popliteal block   Pre-Anesthetic Checklist: , timeout performed,  Correct Patient, Correct Site, Correct Laterality,  Correct Procedure, Correct Position, site marked,  Risks and benefits discussed,  Surgical consent,  Pre-op evaluation,  At surgeon's request and post-op pain management  Laterality: Left  Prep: chloraprep       Needles:  Injection technique: Single-shot  Needle Type: Echogenic Stimulator Needle     Needle Length: 9cm  Needle Gauge: 21     Additional Needles:   Procedures:,,,, ultrasound used (permanent image in chart),,    Narrative:  Start time: 12/10/2022 2:15 PM End time: 12/10/2022 2:20 PM Injection made incrementally with aspirations every 5 mL.  Performed by: Personally  Anesthesiologist: Effie Berkshire, MD  Additional Notes: Discussed risks and benefits of the nerve block in detail, including but not limited vascular injury, permanent nerve damage and infection.   Patient tolerated the procedure well. Local anesthetic introduced in an incremental fashion under minimal resistance after negative aspirations. No paresthesias were elicited. After completion of the procedure, no acute issues were identified and patient continued to be monitored by RN.

## 2022-12-11 ENCOUNTER — Encounter (HOSPITAL_COMMUNITY): Payer: Self-pay | Admitting: Orthopedic Surgery

## 2022-12-11 DIAGNOSIS — L03116 Cellulitis of left lower limb: Secondary | ICD-10-CM | POA: Diagnosis not present

## 2022-12-11 DIAGNOSIS — I96 Gangrene, not elsewhere classified: Secondary | ICD-10-CM | POA: Diagnosis not present

## 2022-12-11 LAB — BASIC METABOLIC PANEL
Anion gap: 10 (ref 5–15)
BUN: 16 mg/dL (ref 6–20)
CO2: 21 mmol/L — ABNORMAL LOW (ref 22–32)
Calcium: 8.6 mg/dL — ABNORMAL LOW (ref 8.9–10.3)
Chloride: 100 mmol/L (ref 98–111)
Creatinine, Ser: 0.93 mg/dL (ref 0.61–1.24)
GFR, Estimated: >60 mL/min (ref >60)
Glucose, Bld: 166 mg/dL — ABNORMAL HIGH (ref 70–99)
Potassium: 4.8 mmol/L (ref 3.5–5.1)
Sodium: 131 mmol/L — ABNORMAL LOW (ref 135–145)

## 2022-12-11 LAB — CBC WITH DIFFERENTIAL/PLATELET
Abs Immature Granulocytes: 0.04 10*3/uL (ref 0.00–0.07)
Basophils Absolute: 0 10*3/uL (ref 0.0–0.1)
Basophils Relative: 0 %
Eosinophils Absolute: 0 10*3/uL (ref 0.0–0.5)
Eosinophils Relative: 0 %
HCT: 36.5 % — ABNORMAL LOW (ref 39.0–52.0)
Hemoglobin: 12.3 g/dL — ABNORMAL LOW (ref 13.0–17.0)
Immature Granulocytes: 0 %
Lymphocytes Relative: 7 %
Lymphs Abs: 0.8 10*3/uL (ref 0.7–4.0)
MCH: 31.1 pg (ref 26.0–34.0)
MCHC: 33.7 g/dL (ref 30.0–36.0)
MCV: 92.2 fL (ref 80.0–100.0)
Monocytes Absolute: 0.5 10*3/uL (ref 0.1–1.0)
Monocytes Relative: 5 %
Neutro Abs: 10.1 10*3/uL — ABNORMAL HIGH (ref 1.7–7.7)
Neutrophils Relative %: 88 %
Platelets: 279 10*3/uL (ref 150–400)
RBC: 3.96 MIL/uL — ABNORMAL LOW (ref 4.22–5.81)
RDW: 15 % (ref 11.5–15.5)
WBC: 11.5 10*3/uL — ABNORMAL HIGH (ref 4.0–10.5)
nRBC: 0 % (ref 0.0–0.2)

## 2022-12-11 MED ORDER — HYDROMORPHONE HCL 1 MG/ML IJ SOLN
0.5000 mg | INTRAMUSCULAR | Status: DC | PRN
  Administered 2022-12-11 – 2022-12-19 (×33): 1 mg via INTRAVENOUS
  Filled 2022-12-11 (×34): qty 1

## 2022-12-11 MED ORDER — KETOROLAC TROMETHAMINE 15 MG/ML IJ SOLN
15.0000 mg | Freq: Four times a day (QID) | INTRAMUSCULAR | Status: AC
  Administered 2022-12-11 – 2022-12-12 (×5): 15 mg via INTRAVENOUS
  Filled 2022-12-11 (×5): qty 1

## 2022-12-11 NOTE — Evaluation (Signed)
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 PMH includes asthma, bioplar 1, depression, schizoaffective disorder, CVA, chronic CHF, homelessness, HTN, DM2, cocaine and cannabis use and R AKA.  Clinical Impression  Pt admitted with above diagnosis. Pt received in bed having taken off limb protector stating that everything is too tight. Pt intermittently frustrated and agitated and escalates, then calms back down and is compliant and agreeable to treatment. Pt with deficits in attention making education a challenge.  Taught pt about positioning and exercises but will need to reteach for full comprehension and application. Pt able to transfer posterior into recliner and then return anterior back to bed (was not comfortable in chair). Needed mod A to move fwd due to trying to WB through L residual limb. Recommend SNF for rehab as pt is homeless. Pt currently with functional limitations due to the deficits listed below (see PT Problem List). Pt will benefit from skilled PT to increase their independence and safety with mobility to allow discharge to the venue listed below.          Recommendations for follow up therapy are one component of a multi-disciplinary discharge planning process, led by the attending physician.  Recommendations may be updated based on patient status, additional functional criteria and insurance authorization.  Follow Up Recommendations Skilled nursing-short term rehab (<3 hours/day) Can patient physically be transported by private vehicle: No    Assistance Recommended at Discharge Frequent or constant Supervision/Assistance  Patient can return home with the following  Assistance with cooking/housework;A little help with bathing/dressing/bathroom    Equipment Recommendations None  recommended by PT  Recommendations for Other Services       Functional Status Assessment Patient has had a recent decline in their functional status and demonstrates the ability to make significant improvements in function in a reasonable and predictable amount of time.     Precautions / Restrictions Precautions Precautions: Fall Precaution Comments: R AKA, L BKA Restrictions Weight Bearing Restrictions: Yes LLE Weight Bearing: Non weight bearing Other Position/Activity Restrictions: L wound vac      Mobility  Bed Mobility Overal bed mobility: Modified Independent Bed Mobility: Supine to Sit, Sit to Supine     Supine to sit: Modified independent (Device/Increase time) Sit to supine: Supervision   General bed mobility comments: pt able to come up to long sitting independently. Supervision for return to supine with vc's to control motion and not just fall onto bed    Transfers Overall transfer level: Needs assistance Equipment used: None Transfers: Bed to chair/wheelchair/BSC         Anterior-Posterior transfers: Min assist   General transfer comment: pt scooted posterior in recliner. He was not comfortable in recliner and also did not trust that he would call for help with going back as he is quite impulsive. Needed mod A to move anterior back into bed because pt trying to push through residual limb    Ambulation/Gait               General Gait Details: unable  Stairs            Wheelchair Mobility    Modified Rankin (Stroke Patients Only)       Balance Overall balance assessment: Modified Independent Sitting-balance support: No upper extremity supported Sitting balance-Leahy Scale: Normal Sitting balance - Comments:  pt able to wt shift in aitting without LOB                                     Pertinent Vitals/Pain Pain Assessment Pain Assessment: Faces Faces Pain Scale: Hurts even more Pain Location: L residual limb Pain  Descriptors / Indicators: Grimacing, Guarding, Burning Pain Intervention(s): Limited activity within patient's tolerance, Monitored during session, Premedicated before session    Home Living Family/patient expects to be discharged to:: Shelter/Homeless                   Additional Comments: Pt generally struggles to enunciate but able to be understood.    Prior Function Prior Level of Function : Patient poor historian/Family not available;Independent/Modified Independent             Mobility Comments: pt remains in w/c even to sleep. Since his CVA he has difficulty with propulsion with RUE and has to ask other people to help. Does not have prosthesis on R.       Hand Dominance   Dominant Hand: Right    Extremity/Trunk Assessment   Upper Extremity Assessment Upper Extremity Assessment: RUE deficits/detail RUE Deficits / Details: tremor noted RUE, decreased with WB'ing and he is able to use RUE during transfers    Lower Extremity Assessment Lower Extremity Assessment: RLE deficits/detail;LLE deficits/detail RLE Deficits / Details: R AKA, hip flex WFL RLE Sensation: WNL RLE Coordination: WNL LLE Deficits / Details: Pt with wound vac and limb protector which he has taken off because he reports it is too tight. Educated pt on process of stump shrinking and proper positioning. Hip flex 3/5. Tolerates full knee extension with assist. Knee flex to 60 deg LLE Sensation: decreased light touch;decreased proprioception LLE Coordination: decreased gross motor    Cervical / Trunk Assessment Cervical / Trunk Assessment: Normal  Communication   Communication: Expressive difficulties  Cognition Arousal/Alertness: Awake/alert Behavior During Therapy: Impulsive (labile) Overall Cognitive Status: No family/caregiver present to determine baseline cognitive functioning Area of Impairment: Attention                   Current Attention Level: Sustained           General  Comments: pt becomes agitated intermittently and then settles back down. Keeps staing he just had surgery. Has difficulty attending to conversation which limits education. Visual handout given for amputee exercises        General Comments General comments (skin integrity, edema, etc.): HR in low 100's with mobility, SPO2 in 90's on RA but with with increased RR and replaced nasal canula end of session at pt's request    Exercises Amputee Exercises Quad Sets: AROM, Left, 10 reps, Seated   Assessment/Plan    PT Assessment Patient needs continued PT services  PT Problem List Decreased strength;Decreased range of motion;Decreased activity tolerance;Decreased balance;Decreased mobility;Decreased cognition;Decreased knowledge of use of DME;Decreased safety awareness;Decreased knowledge of precautions;Impaired sensation;Pain;Decreased skin integrity       PT Treatment Interventions DME instruction;Gait training;Stair training;Functional mobility training;Therapeutic activities;Therapeutic exercise;Balance training;Neuromuscular re-education;Patient/family education;Wheelchair mobility training    PT Goals (Current goals can be found in the Care Plan section)  Acute Rehab PT Goals Patient Stated Goal: decreased pain PT Goal Formulation: With patient Time For Goal Achievement: 12/25/22 Potential to Achieve Goals: Good    Frequency Min 3X/week     Co-evaluation  AM-PAC PT "6 Clicks" Mobility  Outcome Measure Help needed turning from your back to your side while in a flat bed without using bedrails?: None Help needed moving from lying on your back to sitting on the side of a flat bed without using bedrails?: None Help needed moving to and from a bed to a chair (including a wheelchair)?: A Little Help needed standing up from a chair using your arms (e.g., wheelchair or bedside chair)?: Total Help needed to walk in hospital room?: Total Help needed climbing 3-5 steps  with a railing? : Total 6 Click Score: 14    End of Session Equipment Utilized During Treatment: Gait belt;Oxygen Activity Tolerance: Patient tolerated treatment well Patient left: in bed;with call bell/phone within reach;with bed alarm set Nurse Communication: Mobility status PT Visit Diagnosis: Muscle weakness (generalized) (M62.81);Difficulty in walking, not elsewhere classified (R26.2);Other abnormalities of gait and mobility (R26.89)    Time: 5329-9242 PT Time Calculation (min) (ACUTE ONLY): 27 min   Charges:   PT Evaluation $PT Eval Moderate Complexity: 1 Mod PT Treatments $Therapeutic Activity: 8-22 mins        Leighton Roach, PT  Acute Rehab Services Secure chat preferred Office [place] 12/11/2022, 11:42 AM

## 2022-12-11 NOTE — Progress Notes (Signed)
  Progress Note   Patient: [name] Oscar ZJQ:[id number] DOB: [birth year] DOA: 12/07/2022     4 DOS: the patient was seen and examined on 12/11/2022   Brief hospital course: 57 y.o. male with medical history significant of chronic combined HF, CAD, HLD, polyneuropathy, bipolar d/o, COPD. Presenting with LLE chronic venous stasis wound pain. Pt was recently admitted for LLE wound infection. At the time he was seen by podiatry who recommended that he continue augmentin and outpt wound care. Pt reports compliance with the medication since discharge. However, he has had increasing pain since discharge. Of note, pt is homeless. Pt admitted for further management.    Assessment and Plan: Sepsis secondary to infected chronic venous stasis wounds of the left lower extremity. Orthopedics consulted and patient is scheduled for amputation of the left lower extremity. Patient was initially given empiric broad-spectrum IV antibiotics  Now s/p L BKA. Wound vac in place Cont analgesia as needed    History of chronic systolic and diastolic heart failure Patient appears to be compensated at this time he is on room air denies any shortness of breath or chest pain. Echocardiogram in September 2023 showed left ventricular ejection fraction of less than 20% with global hypokinesis. Seems to be euvolemic on exam today    COPD No wheezing heard on exam today. Continued with nebulizers, bronchodilators as needed    Type 2 diabetes mellitus Continue with sliding scale insulin at this time.    History of DVT Patient is on Eliquis for anticoagulation.    Hypertension Blood pressure parameters are optimal.    History of alcohol/cocaine/tobacco abuse currently no withdrawal signs at this time. Cessation done at bedside      Subjective: Shared feelings about losing his LLE this visit. Became tearful  Physical Exam: Vitals:   12/11/22 0410 12/11/22 0501 12/11/22 0751 12/11/22 1250  BP: (!) 132/95 (!) 132/95  136/89 (!) 128/91  Pulse:  100 96 98  Resp: 20 (!) 22 17 20   Temp: 97.6 F (36.4 C) 98 F (36.7 C) 98 F (36.7 C)   TempSrc: Oral Oral Oral   SpO2: 98% 98% 98%   Weight:      Height:       General exam: Awake, laying in bed, in nad Respiratory system: Normal respiratory effort, no wheezing Cardiovascular system: regular rate, s1, s2 Gastrointestinal system: Soft, nondistended, positive BS Central nervous system: CN2-12 grossly intact, strength intact Extremities: BLE amputations, LLE with wound vac Skin: Normal skin turgor, no notable skin lesions seen Psychiatry: Mood normal // no visual hallucinations   Data Reviewed:  Labs reviewed: Na 131, K 4.8, Cr 0.93, WBC 11.5, Hgb 12.3   Family Communication: Pt in room, family not at bedside  Disposition: Status is: Inpatient Remains inpatient appropriate because: Severity of illness  Planned Discharge Destination:  Unclear at this time    Author: Marylu Lund, MD 12/11/2022 6:18 PM  For on call review [email or web address].

## 2022-12-11 NOTE — Plan of Care (Signed)

## 2022-12-11 NOTE — Anesthesia Postprocedure Evaluation (Signed)
Anesthesia Post Note  Patient: [name]  Procedure(s) Performed: LEFT BELOW KNEE AMPUTATION (Left: Knee)     Patient location during evaluation: PACU Anesthesia Type: General and Regional Level of consciousness: awake and alert Pain management: pain level controlled Vital Signs Assessment: post-procedure vital signs reviewed and stable Respiratory status: spontaneous breathing, nonlabored ventilation, respiratory function stable and patient connected to nasal cannula oxygen Cardiovascular status: blood pressure returned to baseline and stable Postop Assessment: no apparent nausea or vomiting Anesthetic complications: no             Effie Berkshire

## 2022-12-11 NOTE — Hospital Course (Addendum)
57 y.o. male with a history of heart failure, CAD, right BKA, COPD, hyperlipidemia, hyperlipidemia, polyneuropathy, schizoaffective/bipolar disorder, was admitted to the hospital with left lower extremity cellulitis and gangrene.   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       During hospitalization, patient was also noted to have maxillary abscess and periapical abscess and underwent oral surgery with I&D and of anterior maxillary abscess and surgical extraction of multiple teeth in alveoloplasty. Currently stable.   Hospital stay prolonged due to difficulty in placement due to homelessness status status post left BKA and right AKA.   [facility],  have offered to provide a prosthesis for LLE (the BKA)

## 2022-12-11 NOTE — Progress Notes (Signed)
Patient ID: [name], male   DOB: 1966/07/25, 57 y.o.   MRN: [id number] Patient is postoperative day 1 left below-knee amputation.  There is no drainage in the wound VAC canister there is a good suction fit.  Patient states that he wants a regular diet and increased pain medicine.  Frequency of Dilaudid increased to every 3 hours and a regular diet order written.

## 2022-12-11 NOTE — Evaluation (Signed)
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 PMH includes asthma, bioplar 1, depression, schizoaffective disorder, CVA, chronic CHF, homelessness, HTN, DM2, cocaine and cannabis use and R AKA.   Clinical Impression   Pt in bed upon therapy arrival and agreeable to participate in OT evaluation. Prior to admit, pt was primarily wheelchair bound, homeless, staying [place]. He reports that he would sleep in his chair and use public restrooms with grab bar available. Currently, pt is presenting with decreased UB strength, endurance, and decreased balance requiring slightly increased level of physical assist to complete BADL tasks. Recommend SNF at discharge to focus on mentioned deficits. Next session focus will be on toilet transfers from personal wheelchair to prepare for discharge.       Recommendations for follow up therapy are one component of a multi-disciplinary discharge planning process, led by the attending physician.  Recommendations may be updated based on patient status, additional functional criteria and insurance authorization.   Follow Up Recommendations  Skilled nursing-short term rehab (<3 hours/day)     Assistance Recommended at Discharge PRN  Patient can return home with the following A little help with walking and/or transfers;A little help with bathing/dressing/bathroom;Help with stairs or ramp for entrance;Assist for transportation    Functional Status Assessment  Patient has had a recent decline in their functional status and demonstrates the ability to make significant improvements in function in a reasonable and predictable amount of time.  Equipment Recommendations  Other (comment) (TBD)       Precautions / Restrictions Precautions Precautions:  Fall Precaution Comments: R AKA, L BKA Restrictions Weight Bearing Restrictions: Yes LLE Weight Bearing: Non weight bearing Other Position/Activity Restrictions: L wound vac      Mobility Bed Mobility Overal bed mobility: Modified Independent Bed Mobility: Supine to Sit, Sit to Supine     Supine to sit: Modified independent (Device/Increase time) Sit to supine: Modified independent (Device/Increase time)     Patient Response: Cooperative  Transfers Overall transfer level:  (not completed this session. See PT eval)        Balance Overall balance assessment: Modified Independent Sitting-balance support: No upper extremity supported Sitting balance-Leahy Scale: Good       ADL either performed or assessed with clinical judgement   ADL Overall ADL's : Needs assistance/impaired Eating/Feeding: Independent;Bed level   Grooming: Wash/dry face;Wash/dry hands;Set up;Bed level   Upper Body Bathing: Set up;Sitting   Lower Body Bathing: Set up;Bed level   Upper Body Dressing : Set up;Sitting   Lower Body Dressing: Min guard;Bed level   Toilet Transfer: Minimal assistance;BSC/3in1;Anterior/posterior   Toileting- Water quality scientist and Hygiene: Min guard;Sitting/lateral lean         Vision Baseline Vision/History: 0 No visual deficits Ability to See in Adequate Light: 0 Adequate Patient Visual Report: No change from baseline Vision Assessment?: No apparent visual deficits            Pertinent Vitals/Pain Pain Assessment Pain Assessment: Faces Faces Pain Scale: Hurts even more Pain Location: L residual limb - reports phantom pain Pain Descriptors / Indicators: Operative site guarding, Sharp, Jabbing Pain Intervention(s): Limited activity within patient's tolerance, Monitored during session, Patient requesting pain meds-RN notified     Hand Dominance Right   Extremity/Trunk Assessment Upper Extremity Assessment Upper Extremity Assessment: Generalized  weakness RUE Deficits /  Details: tremot noted in RUE that decreasingwhen weightbearing. Able to use RUE during transfers.   Lower Extremity Assessment Lower Extremity Assessment: Defer to PT evaluation       Communication Communication Communication: Expressive difficulties   Cognition Arousal/Alertness: Awake/alert Behavior During Therapy: Flat affect Overall Cognitive Status: No family/caregiver present to determine baseline cognitive functioning Area of Impairment: Attention, Memory, Safety/judgement       Memory: Decreased recall of precautions   Safety/Judgement: Decreased awareness of deficits, Decreased awareness of safety     General Comments: Required re-iteration regarding Right residual limb precautions and ROM recommendations.     General Comments  VSS            Home Living Family/patient expects to be discharged to:: Shelter/Homeless        Prior Functioning/Environment Prior Level of Function : Independent/Modified Independent     Mobility Comments: pt remains in w/c even to sleep. Since his CVA he has difficulty with propulsion with RUE and has to ask other people to help. Does not have prosthesis on the right. ADLs Comments: pt reports that he was homeless [place]. He completes all dressing himself in his w/c. Utilizes public restrooms with grab bars to complete toileting. He washes up at the sink.        OT Problem List: Decreased strength;Decreased safety awareness;Decreased activity tolerance;Decreased knowledge of use of DME or AE;Impaired balance (sitting and/or standing)      OT Treatment/Interventions: Therapeutic activities;Self-care/ADL training;Therapeutic exercise;Neuromuscular education;Energy conservation;Patient/family education;DME and/or AE instruction;Balance training;Manual therapy;Modalities    OT Goals(Current goals can be found in the care plan section) Acute Rehab OT Goals Patient Stated Goal: none  stated OT Goal Formulation: With patient Time For Goal Achievement: 12/25/22 Potential to Achieve Goals: Good  OT Frequency: Min 2X/week       AM-PAC OT "6 Clicks" Daily Activity     Outcome Measure Help from another person eating meals?: None Help from another person taking care of personal grooming?: None Help from another person toileting, which includes using toliet, bedpan, or urinal?: A Little Help from another person bathing (including washing, rinsing, drying)?: A Little Help from another person to put on and taking off regular upper body clothing?: None Help from another person to put on and taking off regular lower body clothing?: A Little 6 Click Score: 21   End of Session Nurse Communication: Patient requests pain meds  Activity Tolerance: Patient tolerated treatment well Patient left: in bed;with call bell/phone within reach;with bed alarm set  OT Visit Diagnosis: Muscle weakness (generalized) (M62.81)                Time: 8563-1497 OT Time Calculation (min): 20 min Charges:  OT General Charges $OT Visit: 1 Visit OT Evaluation $OT Eval Moderate Complexity: 1 Mod  [employer], OTR/L,CBIS  Supplemental OT - [facility] and [facility] Secure Chat Preferred    Dajour Pierpoint, [name] 12/11/2022, 4:37 PM

## 2022-12-11 NOTE — Progress Notes (Signed)
Patient claims he came in the hospital with his wheelchair and a black coat. Pt was transferred from [facility]. Pt was admitted to the unit with no belongings at the bedside. RN called [facility] and confirmed that pt's belongings were left in the unit. [facility] to make arrangement for pt to receive personal belongings.

## 2022-12-12 DIAGNOSIS — I96 Gangrene, not elsewhere classified: Secondary | ICD-10-CM | POA: Diagnosis not present

## 2022-12-12 DIAGNOSIS — L03116 Cellulitis of left lower limb: Secondary | ICD-10-CM | POA: Diagnosis not present

## 2022-12-12 LAB — CBC WITH DIFFERENTIAL/PLATELET
Abs Immature Granulocytes: 0.05 10*3/uL (ref 0.00–0.07)
Basophils Absolute: 0 10*3/uL (ref 0.0–0.1)
Basophils Relative: 0 %
Eosinophils Absolute: 0.1 10*3/uL (ref 0.0–0.5)
Eosinophils Relative: 1 %
HCT: 34 % — ABNORMAL LOW (ref 39.0–52.0)
Hemoglobin: 11.2 g/dL — ABNORMAL LOW (ref 13.0–17.0)
Immature Granulocytes: 0 %
Lymphocytes Relative: 15 %
Lymphs Abs: 1.7 10*3/uL (ref 0.7–4.0)
MCH: 30.7 pg (ref 26.0–34.0)
MCHC: 32.9 g/dL (ref 30.0–36.0)
MCV: 93.2 fL (ref 80.0–100.0)
Monocytes Absolute: 1.1 10*3/uL — ABNORMAL HIGH (ref 0.1–1.0)
Monocytes Relative: 10 %
Neutro Abs: 8.3 10*3/uL — ABNORMAL HIGH (ref 1.7–7.7)
Neutrophils Relative %: 74 %
Platelets: 290 10*3/uL (ref 150–400)
RBC: 3.65 MIL/uL — ABNORMAL LOW (ref 4.22–5.81)
RDW: 15.4 % (ref 11.5–15.5)
WBC: 11.2 10*3/uL — ABNORMAL HIGH (ref 4.0–10.5)
nRBC: 0 % (ref 0.0–0.2)

## 2022-12-12 LAB — SURGICAL PATHOLOGY

## 2022-12-12 LAB — CULTURE, BLOOD (ROUTINE X 2)
Culture: NO GROWTH
Culture: NO GROWTH
Special Requests: ADEQUATE

## 2022-12-12 NOTE — Progress Notes (Signed)
  Progress Note   Patient: [name] JSE:[id number] DOB: [birth year] DOA: 12/07/2022     5 DOS: the patient was seen and examined on 12/12/2022   Brief hospital course: 57 y.o. male with medical history significant of chronic combined HF, CAD, HLD, polyneuropathy, bipolar d/o, COPD. Presenting with LLE chronic venous stasis wound pain. Pt was recently admitted for LLE wound infection. At the time he was seen by podiatry who recommended that he continue augmentin and outpt wound care. Pt reports compliance with the medication since discharge. However, he has had increasing pain since discharge. Of note, pt is homeless. Pt admitted for further management.    Assessment and Plan: Sepsis secondary to infected chronic venous stasis wounds of the left lower extremity. Orthopedics consulted and patient is scheduled for amputation of the left lower extremity. Patient was initially given empiric broad-spectrum IV antibiotics  Now s/p L BKA. Continue wound vac in place Cont analgesia as needed    History of chronic systolic and diastolic heart failure Patient appears to be compensated at this time he is on room air denies any shortness of breath or chest pain. Echocardiogram in September 2023 showed left ventricular ejection fraction of less than 20% with global hypokinesis. Remains euvolemic on exam    COPD No wheezing heard on exam today. Lungs clear Unclear why pt documented needing 3LNC overnight. Denies sob Continued with nebulizers, bronchodilators as needed    Type 2 diabetes mellitus Continue with sliding scale insulin at this time.    History of DVT Patient is on Eliquis for anticoagulation.    Hypertension Blood pressure parameters are optimal.    History of alcohol/cocaine/tobacco abuse currently no withdrawal signs at this time. Cessation done at bedside      Subjective: Complaining of continued stump pains, worse with movement. Also reports intentionally "holding it in" and  not having bowel movement for fear of "messing up" the wound vac set up  Physical Exam: Vitals:   12/11/22 1250 12/11/22 1928 12/12/22 0403 12/12/22 0758  BP: (!) 128/91 127/87 107/72 123/85  Pulse: 98 94  96  Resp: 20 18 17 19   Temp:  97.7 F (36.5 C) 98 F (36.7 C) 98.2 F (36.8 C)  TempSrc:  Oral Oral Oral  SpO2:  100% 100% 100%  Weight:      Height:       General exam: Conversant, in no acute distress Respiratory system: normal chest rise, clear, no audible wheezing Cardiovascular system: regular rhythm, s1-s2 Gastrointestinal system: Nondistended, nontender, pos BS Central nervous system: No seizures, no tremors Extremities: No cyanosis, no joint deformities Skin: No rashes, no pallor Psychiatry: Affect normal // no auditory hallucinations   Data Reviewed:  Labs reviewed: WBC 11.2, Hgb 11.2, Plts 290   Family Communication: Pt in room, family not at bedside  Disposition: Status is: Inpatient Remains inpatient appropriate because: Severity of illness  Planned Discharge Destination:  Unclear at this time    Author: Marylu Lund, MD 12/12/2022 4:02 PM  For on call review [email or web address].

## 2022-12-12 NOTE — Progress Notes (Signed)
Patient ID: [name], male   DOB: 1965/12/26, 57 y.o.   MRN: [id number] Patient is status post left below-knee amputation.  Plan for discharge to skilled nursing on Monday.  Patient's wound VAC does not have a good seal and plan for removal of the VAC applied dry dressing at day of discharge.

## 2022-12-12 NOTE — Progress Notes (Signed)
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 PMH includes asthma, bioplar 1, depression, schizoaffective disorder, CVA, chronic CHF, homelessness, HTN, DM2, cocaine and cannabis use and R AKA.   OT comments  Pt in bed upon therapy arrival and agreeable to participate in OT treatment session. Pt stated that he needed to use the bathroom and was asking when his wound vac would be removed to allow him to go. Education provided to patient throughout session while answering questions related to the wound vac, his pain experienced with this surgery versus previous surgery 6 months ago. Plan for session was to complete toilet transfer from w/c although pt was unable to make it to the bathroom in time and task was modified to complete AP transfer from bed to [facility]. Pt completed transfer with SBA while therapist stabilized commode. Pt physically is progressing with therapy although demonstrates difficulty carrying over education provided during session pertaining to WB restrictions for the LLE. Pt consistently required re-iteration of education. Directed pt's questions related to medication to his Nurse. Unable to answer questions about removal  of  wound vac and encouraged him to ask his Dr. Sharol Given or Dr. Wyline Copas when they make their rounds.    Recommendations for follow up therapy are one component of a multi-disciplinary discharge planning process, led by the attending physician.  Recommendations may be updated based on patient status, additional functional criteria and insurance authorization.    Follow Up Recommendations  Skilled nursing-short term rehab (<3 hours/day)     Assistance Recommended at Discharge PRN  Patient can return home with the following  A little help with walking and/or transfers;A little help  with bathing/dressing/bathroom;Help with stairs or ramp for entrance;Assist for transportation   Equipment Recommendations  Other (comment) (defer to next venue)       Precautions / Restrictions Precautions Precautions: Fall Precaution Comments: R AKA, L BKA Restrictions Weight Bearing Restrictions: Yes LLE Weight Bearing: Non weight bearing Other Position/Activity Restrictions: L wound vac       Mobility Bed Mobility Overal bed mobility: Needs Assistance Bed Mobility: Rolling Rolling: Supervision         General bed mobility comments: Patient physically Mod I with bed mobility although cognitively he requires supervision as he does not follow WB restrictions set in place for his LLE even when provided with education and cueing throughout session. Patient Response: Cooperative, Flat affect  Transfers Overall transfer level: Needs assistance Equipment used: None Transfers: Bed to chair/wheelchair/BSC         Anterior-Posterior transfers: Supervision   General transfer comment: AP transfer completed to and from [facility] from bed. When returning to bed, pt was unable to maintain an upright position and instead transitioned to all fours position in bed even when provided with education on NWB status.     Balance Overall balance assessment: Modified Independent Sitting-balance support: No upper extremity supported Sitting balance-Leahy Scale: Good Sitting balance - Comments: pt able to sit without support on EOB to eat lunch           ADL either performed or assessed with clinical judgement   ADL Overall ADL's : Needs assistance/impaired Eating/Feeding: Independent;Sitting           Toilet Transfer: Supervision/safety;BSC/3in1;Anterior/posterior Toilet Transfer Details (indicate cue type and reason): OT stabilized BSC during transfer and provided VC  for safety, sequencing, and WB restriction adherance. Toileting- Clothing Manipulation and Hygiene: Total  assistance;Sitting/lateral lean Toileting - Clothing Manipulation Details (indicate cue type and reason): Pt completed chair push up using arm rests of BSC to allow therapist to complete hygiene. Pt declined attempting himself stating that he was fearful of falling.              Cognition Arousal/Alertness: Awake/alert Behavior During Therapy: Flat affect Overall Cognitive Status: No family/caregiver present to determine baseline cognitive functioning Area of Impairment: Memory, Safety/judgement, Awareness, Problem solving, Attention   Memory: Decreased recall of precautions   Safety/Judgement: Decreased awareness of deficits, Decreased awareness of safety   Problem Solving: Slow processing, Requires verbal cues, Requires tactile cues General Comments: Continued education required regarding WB restrictions to LLE as well as need for limb protector.              General Comments Pt demonstrated SOB when completing toilet transfer. Oxygen was off when therapy entered room and was replaced once on BSC.    Pertinent Vitals/ Pain       Pain Assessment Pain Assessment: 0-10 Pain Score: 8  Pain Location: L residual limb - reports phantom pain and operative pain Pain Descriptors / Indicators: Operative site guarding, Sharp, Jabbing Pain Intervention(s): Limited activity within patient's tolerance, Monitored during session, Patient requesting pain meds-RN notified, RN gave pain meds during session         Frequency  Min 2X/week        Progress Toward Goals  OT Goals(current goals can now be found in the care plan section)  Progress towards OT goals: Progressing toward goals     Plan Discharge plan remains appropriate;Frequency remains appropriate       AM-PAC OT "6 Clicks" Daily Activity     Outcome Measure   Help from another person eating meals?: None Help from another person taking care of personal grooming?: None Help from another person toileting, which  includes using toliet, bedpan, or urinal?: A Lot Help from another person bathing (including washing, rinsing, drying)?: A Little Help from another person to put on and taking off regular upper body clothing?: None Help from another person to put on and taking off regular lower body clothing?: A Little 6 Click Score: 20    End of Session Equipment Utilized During Treatment: Oxygen  OT Visit Diagnosis: Muscle weakness (generalized) (M62.81)   Activity Tolerance Patient tolerated treatment well   Patient Left in bed;with call bell/phone within reach   Nurse Communication Patient requests pain meds        Time: 1349-1445 OT Time Calculation (min): 56 min  Charges: OT Treatments $Self Care/Home Management : 38-52 mins $Therapeutic Activity: 8-22 mins  Ailene Ravel, OTR/L,CBIS  Supplemental OT - [facility] Secure [facility] Preferred    Twylah Bennetts, [name] 12/12/2022, 3:38 PM

## 2022-12-12 NOTE — Progress Notes (Addendum)
RE: [name] Date of Birth: [birth year]  To Whom It May Concern:  Please be advised that the above-named patient will require a short-term nursing home stay - anticipated 30 days or less for rehabilitation and strengthening.  The plan is for return home.

## 2022-12-12 NOTE — NC FL2 (Addendum)
[place] LEVEL OF CARE FORM     IDENTIFICATION  Patient Name: [name] Birthdate: [birth year] Sex: male Admission Date (Current Location): 12/07/2022  [facility] and [place] Number:  Herbalist and Address:  [facility] [employer] [address], [place], [place] [address]      Provider Number: [id number]  Attending Physician Name and Address:  Donne Hazel, MD  Relative Name and Phone Number:       Current Level of Care: Hospital Recommended Level of Care: [place] Prior Approval Number:    Date Approved/Denied:   PASRR Number: [id number]  Discharge Plan: SNF    Current Diagnoses: Patient Active Problem List   Diagnosis Date Noted   Gangrene of left foot ([place]) 12/09/2022   Cellulitis of left lower extremity 12/07/2022   COPD (chronic obstructive pulmonary disease) (Pe[place]) 11/25/2022   Pulmonary emboli ([place]) 08/17/2022   Venous ulcer ([place]) 07/23/2022   Sinus bradycardia 07/23/2022   Anemia of chronic disease 07/23/2022   S/P AKA (above knee amputation), right ([place]) 07/23/2022   Acute on chronic systolic congestive heart failure ([place]) 07/12/2022   History of DVT (deep vein thrombosis) 07/12/2022   CAP (community acquired pneumonia) 07/12/2022   Chronic venous hypertension (idiopathic) with ulcer and inflammation of bilateral lower extremity ([place])    Sepsis due to cellulitis ([place]) 04/04/2022   Normocytic anemia 04/04/2022   Acute deep vein thrombosis (DVT) of brachial vein ([place]ullahoma) 03/31/2022   Swelling of lower extremity 03/23/2022   SIRS (systemic inflammatory response syndrome) ([place]) 03/23/2022   Leg swelling 03/23/2022   Acute on chronic systolic heart failure ([place]) 02/26/2022   Seizure ([place]an Falls) 02/26/2022   Venous stasis ulcer of right ankle limited to breakdown of skin with varicose veins (Olmito)    Hypertensive urgency 01/29/2022   Prolonged QT interval 01/29/2022   Alcohol abuse 01/29/2022   chronic ulcers  01/29/2022   Wound infection 12/04/2021   Open wounds involving multiple regions of lower extremity 12/03/2021   Mixed diabetic hyperlipidemia associated with type 2 diabetes mellitus (Geneva) 12/03/2021   Cellulitis of right lower extremity 11/13/2021   DM2 (diabetes mellitus, type 2) (Breckenridge) 11/13/2021   Antisocial personality disorder (Hilliard) 11/04/2021   Malingering 11/04/2021   Foot pain 04/23/2021   Chronic combined systolic and diastolic congestive heart failure (WaKeeney) 03/02/2021   History of stroke 03/02/2021   Polysubstance abuse ([place]) 03/02/2021   Skin ulcer of right great toe with necrosis of bone ([place]annasville)    Encounter for assessment of healthcare decision-making capacity    Pain, dental    Dystonia    Dysarthria    Shock liver 58/52/7782   Acute metabolic encephalopathy 42/35/3614   Coronary artery disease involving native coronary artery of native heart without angina pectoris 01/04/2021   Essential hypertension 01/04/2021   Bipolar I disorder (Sulphur Springs) 01/04/2021   Cocaine abuse (Paradise Heights) 01/04/2021   Homelessness 01/04/2021   HLD (hyperlipidemia) 01/04/2021   Osteomyelitis of great toe (HCC)    Ulcer of great toe, left, with necrosis of bone (HCC)    Ulcer of great toe, right, with necrosis of muscle (HCC)    Acute osteomyelitis of toe, left (HCC)    Acute on chronic combined systolic and diastolic CHF (congestive heart failure) (Ironton) 12/20/2020   Respiratory failure (Bonney Lake)    Ischemic stroke (Caledonia) 12/17/2020   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   Diabetic foot ulcer (Cornelius) 12/08/2020   CHF (congestive heart failure) ([place] Kingstown) 11/14/2020  CHF exacerbation (Stallion Springs) 11/13/2020   Hypokalemia    Hyperlipidemia    Acute hypoxemic respiratory failure ([place] Paraiso) 10/26/2020   COPD with acute exacerbation (St. Paul) 10/14/2020   Acute on chronic combined systolic and diastolic CHF (congestive heart failure) ([place]altham) 10/02/2020   Heart failure with reduced ejection fraction and diastolic dysfunction (Milford)  09/07/2020   Near syncope 07/21/2020   Acid reflux 06/03/2020   Cannabis abuse 03/19/2020   Acute bronchitis due to Rhinovirus 09/08/2019   Schizoaffective disorder (Gardner) 06/24/2019   SOB (shortness of breath)    QT prolongation 05/25/2019   Cocaine abuse (HCC)    Elevated troponin    Homeless    Suicidal ideation    Bilateral lower extremity pain 04/05/2019   Housing situation unstable 03/28/2019   Bilateral lower extremity edema    Acute exacerbation of CHF (congestive heart failure) (Rake) 03/04/2019   Alcohol withdrawal syndrome, with delirium (Ewing) 05/03/2018   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Racine) 03/02/2018   Mild intermittent asthma without complication 09/32/3557   Asthma 12/26/2016   Tobacco use disorder 12/26/2016   Schizophrenia (Highlandville) 12/26/2016   Severe episode of recurrent major depressive disorder, with psychotic features (Yellville) 10/31/2016   Cocaine abuse with cocaine-induced mood disorder (Ashton) 09/06/2016   Alcohol use disorder, mild, abuse 07/31/2016   Substance induced mood disorder (Interlachen) 07/30/2016   Schizoaffective disorder, bipolar type ([place]ailles) 05/31/2016   Cocaine use disorder, severe, dependence ([place]) 05/31/2016   Major depressive disorder, recurrent episode (Stockton) 05/30/2016   Suicidal behavior with attempted self-injury (Penobsc[place]) 05/08/2016   Acetaminophen overdose 05/06/2016    Orientation RESPIRATION BLADDER Height & Weight     Self, Time, Situation, Place  Normal Continent Weight: 174 lb 13.2 oz (79.3 kg) Height:  5\' 9"  (175.3 cm)  BEHAVIORAL SYMPTOMS/MOOD NEUROLOGICAL BOWEL NUTRITION STATUS      Continent    AMBULATORY STATUS COMMUNICATION OF NEEDS Skin   Extensive Assist Verbally Surgical wounds (BKA)                       Personal Care Assistance Level of Assistance  Bathing, Feeding, Dressing Bathing Assistance: Limited assistance Feeding assistance: Limited assistance Dressing Assistance: Maximum assistance      Functional Limitations Info  Sight, Hearing, Speech Sight Info: Adequate Hearing Info: Adequate Speech Info: Adequate    SPECIAL CARE FACTORS FREQUENCY  PT (By licensed PT), OT (By licensed OT)                    Contractures Contractures Info: Not present    Additional Factors Info  Code Status Code Status Info: FULL CODE             Current Medications (12/12/2022):  This is the current hospital active medication list Current Facility-Administered Medications  Medication Dose Route Frequency Provider Last Rate Last Admin   0.9 %  sodium chloride infusion   Intravenous Continuous Newt Minion, MD 75 mL/hr at 12/10/22 1827 New Bag at 12/10/22 1827   albuterol (PROVENTIL) (2.5 MG/3ML) 0.083% nebulizer solution 2.5 mg  2.5 mg Nebulization Q6H PRN Newt Minion, MD   2.5 mg at 12/10/22 1328   alum & mag hydroxide-simeth (MAALOX/MYLANTA) 200-200-20 MG/5ML suspension 15-30 mL  15-30 mL Oral Q2H PRN Newt Minion, MD       amLODipine (NORVASC) tablet 10 mg  10 mg Oral Daily Newt Minion, MD   10 mg at 12/12/22 1140   ascorbic acid (VITAMIN C) tablet 1,000  mg  1,000 mg Oral Daily Nadara Mustard, MD   1,000 mg at 12/12/22 1139   bisacodyl (DULCOLAX) EC tablet 5 mg  5 mg Oral Daily PRN Nadara Mustard, MD       docusate sodium (COLACE) capsule 100 mg  100 mg Oral Daily Nadara Mustard, MD   100 mg at 12/12/22 1140   folic acid (FOLVITE) tablet 1 mg  1 mg Oral Daily Nadara Mustard, MD   1 mg at 12/12/22 1139   gabapentin (NEURONTIN) capsule 900 mg  900 mg Oral TID Nadara Mustard, MD   900 mg at 12/12/22 1139   guaiFENesin-dextromethorphan (ROBITUSSIN DM) 100-10 MG/5ML syrup 15 mL  15 mL Oral Q4H PRN Nadara Mustard, MD       hydrALAZINE (APRESOLINE) injection 5 mg  5 mg Intravenous Q20 Min PRN Nadara Mustard, MD       hydrocerin (EUCERIN) cream   Topical Daily Nadara Mustard, MD   Given at 12/09/22 [phone]   HYDROmorphone (DILAUDID) injection 0.5-1 mg  0.5-1 mg Intravenous Q3H PRN  Nadara Mustard, MD   1 mg at 12/12/22 1403   isosorbide mononitrate (IMDUR) 24 hr tablet 15 mg  15 mg Oral Daily Nadara Mustard, MD   15 mg at 12/12/22 1140   ketorolac (TORADOL) 15 MG/ML injection 15 mg  15 mg Intravenous Q6H Nadara Mustard, MD   15 mg at 12/12/22 1140   labetalol (NORMODYNE) injection 10 mg  10 mg Intravenous Q10 min PRN Nadara Mustard, MD       magnesium citrate solution 1 Bottle  1 Bottle Oral Once PRN Nadara Mustard, MD       magnesium sulfate IVPB 2 g 50 mL  2 g Intravenous Daily PRN Nadara Mustard, MD       metoprolol tartrate (LOPRESSOR) injection 2-5 mg  2-5 mg Intravenous Q2H PRN Nadara Mustard, MD       morphine (PF) 4 MG/ML injection 4 mg  4 mg Intravenous Q4H PRN Nadara Mustard, MD   4 mg at 12/11/22 1702   multivitamin with minerals tablet 1 tablet  1 tablet Oral Daily Nadara Mustard, MD   1 tablet at 12/12/22 1139   nutrition supplement (JUVEN) (JUVEN) powder packet 1 packet  1 packet Oral BID BM Nadara Mustard, MD   1 packet at 12/12/22 1404   ondansetron (ZOFRAN) injection 4 mg  4 mg Intravenous Q6H PRN Nadara Mustard, MD       oxyCODONE (Oxy IR/ROXICODONE) immediate release tablet 10-15 mg  10-15 mg Oral Q4H PRN Nadara Mustard, MD   15 mg at 12/12/22 1139   oxyCODONE (Oxy IR/ROXICODONE) immediate release tablet 5 mg  5 mg Oral Q4H PRN Nadara Mustard, MD   5 mg at 12/10/22 1026   oxyCODONE (Oxy IR/ROXICODONE) immediate release tablet 5-10 mg  5-10 mg Oral Q4H PRN Nadara Mustard, MD       pantoprazole (PROTONIX) EC tablet 40 mg  40 mg Oral Daily Nadara Mustard, MD   40 mg at 12/12/22 1140   phenol (CHLORASEPTIC) mouth spray 1 spray  1 spray Mouth/Throat PRN Nadara Mustard, MD       polyethylene glycol (MIRALAX / GLYCOLAX) packet 17 g  17 g Oral Daily PRN Nadara Mustard, MD       potassium chloride SA (KLOR-CON M) CR tablet 20-40 mEq  20-40 mEq Oral Daily  PRN Nadara Mustard, MD       thiamine (VITAMIN B1) tablet 100 mg  100 mg Oral Daily Nadara Mustard, MD   100 mg  at 12/12/22 1139   zinc sulfate capsule 220 mg  220 mg Oral Daily Nadara Mustard, MD   220 mg at 12/12/22 1139     Discharge Medications: Please see discharge summary for a list of discharge medications.  Relevant Imaging Results:  Relevant Lab Results:   Additional Information SSN: [id number]  Deatra Robinson, [name]

## 2022-12-12 NOTE — TOC Initial Note (Addendum)
Transition of Care [facility]) - Initial/Assessment Note    Patient Details  Name: [name] MRN: [id number] Date of Birth: 03/27/1966  [facility] CM/SW Contact:    Deatra Robinson, [name] Phone Number: 12/12/2022, 2:48 PM  Clinical Narrative:  Spoke to pt re PT recommendation for SNF. Pt reports agreeable to SNF and is requesting [facility] (formerly [address] Reviewed SNF placement process and answered questions. Will f/u with offers as available. Pt will be a level 2 PASRR screen due to psych and substance use history. MD aware of need for cosign on 30 day note and FL2.   Dellie Burns, MSW, [name] [phone] (coverage)                    Expected Discharge Plan: Skilled Nursing Facility Barriers to Discharge: Continued Medical Work up, SNF Pending bed offer   Patient Goals and CMS Choice Patient states their goals for this hospitalization and ongoing recovery are:: home less CMS Medicare.gov Compare Post Acute Care list provided to:: Patient Choice offered to / list presented to : Patient      Expected Discharge Plan and Services   Discharge Planning Services: CM Consult Post Acute Care Choice: Skilled Nursing Facility Living arrangements for the past 2 months: Homeless                                      Prior Living Arrangements/Services Living arrangements for the past 2 months: Homeless Lives with:: Self Patient language and need for interpreter reviewed:: Yes Do you feel safe going back to the place where you live?: Yes      Need for Family Participation in Patient Care: Yes (Comment) Care giver support system in place?: No (comment)   Criminal Activity/Legal Involvement Pertinent to Current Situation/Hospitalization: No - Comment as needed  Activities of Daily Living Home Assistive Devices/Equipment: Wheelchair ADL Screening (condition at time of admission) Patient's cognitive ability adequate to safely complete daily  activities?: No Is the patient deaf or have difficulty hearing?: No Does the patient have difficulty seeing, even when wearing glasses/contacts?: No Does the patient have difficulty concentrating, remembering, or making decisions?: No Patient able to express need for assistance with ADLs?: No Does the patient have difficulty dressing or bathing?: No Independently performs ADLs?: Yes (appropriate for developmental age) Does the patient have difficulty walking or climbing stairs?: Yes Weakness of Legs: Left Weakness of Arms/Hands: None  Permission Sought/Granted Permission sought to share information with : Facility Industrial/product designer granted to share information with : Yes, Verbal Permission Granted              Emotional Assessment Appearance:: Appears stated age Attitude/Demeanor/Rapport: Engaged Affect (typically observed): Appropriate, Irritable Orientation: : Oriented to Self, Oriented to Place, Oriented to  Time, Oriented to Situation Alcohol / Substance Use: Alcohol Use, Illicit Drugs Psych Involvement: No (comment)  Admission diagnosis:  Cellulitis of left lower extremity [L03.116] Cellulitis of left ankle [L03.116] Patient Active Problem List   Diagnosis Date Noted   Gangrene of left foot (HCC) 12/09/2022   Cellulitis of left lower extremity 12/07/2022   COPD (chronic obstructive pulmonary disease) (HCC) 11/25/2022   Pulmonary emboli (HCC) 08/17/2022   Venous ulcer (HCC) 07/23/2022   Sinus bradycardia 07/23/2022   Anemia of chronic disease 07/23/2022   S/P AKA (above knee amputation), right (HCC) 07/23/2022   Acute on chronic systolic congestive heart  failure (Tinsman) 07/12/2022   History of DVT (deep vein thrombosis) 07/12/2022   CAP (community acquired pneumonia) 07/12/2022   Chronic venous hypertension (idiopathic) with ulcer and inflammation of bilateral lower extremity (HCC)    Sepsis due to cellulitis (Clovis) 04/04/2022   Normocytic anemia  04/04/2022   Acute deep vein thrombosis (DVT) of brachial vein (HCC) 03/31/2022   Swelling of lower extremity 03/23/2022   SIRS (systemic inflammatory response syndrome) (Ardencroft) 03/23/2022   Leg swelling 03/23/2022   Acute on chronic systolic heart failure (Paintsville) 02/26/2022   Seizure (Swansea) 02/26/2022   Venous stasis ulcer of right ankle limited to breakdown of skin with varicose veins (Braddock)    Hypertensive urgency 01/29/2022   Prolonged QT interval 01/29/2022   Alcohol abuse 01/29/2022   chronic ulcers 01/29/2022   Wound infection 12/04/2021   Open wounds involving multiple regions of lower extremity 12/03/2021   Mixed diabetic hyperlipidemia associated with type 2 diabetes mellitus (Redfield) 12/03/2021   Cellulitis of right lower extremity 11/13/2021   DM2 (diabetes mellitus, type 2) (Henrietta) 11/13/2021   Antisocial personality disorder ([place]inglestown) 11/04/2021   Malingering 11/04/2021   Foot pain 04/23/2021   Chronic combined systolic and diastolic congestive heart failure ([place] Station) 03/02/2021   History of stroke 03/02/2021   Polysubstance abuse (Wyndham) 03/02/2021   Skin ulcer of right great toe with necrosis of bone (Rushville)    Encounter for assessment of healthcare decision-making capacity    Pain, dental    Dystonia    Dysarthria    Shock liver 43/32/9518   Acute metabolic encephalopathy 84/16/6063   Coronary artery disease involving native coronary artery of native heart without angina pectoris 01/04/2021   Essential hypertension 01/04/2021   Bipolar I disorder (Crownpoint) 01/04/2021   Cocaine abuse (Sw[place]) 01/04/2021   Homelessness 01/04/2021   HLD (hyperlipidemia) 01/04/2021   Osteomyelitis of great toe (HCC)    Ulcer of great toe, left, with necrosis of bone (HCC)    Ulcer of great toe, right, with necrosis of muscle (HCC)    Acute osteomyelitis of toe, left (HCC)    Acute on chronic combined systolic and diastolic CHF (congestive heart failure) ([place]) 12/20/2020   Respiratory failure (Denver)     Ischemic stroke (Senecaville) 12/17/2020   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   Diabetic foot ulcer ([place]) 12/08/2020   CHF (congestive heart failure) ([place]ady Grove) 11/14/2020   CHF exacerbation (Pindall) 11/13/2020   Hypokalemia    Hyperlipidemia    Acute hypoxemic respiratory failure ([place]) 10/26/2020   COPD with acute exacerbation ([place] Pinos) 10/14/2020   Acute on chronic combined systolic and diastolic CHF (congestive heart failure) (Tom[place]) 10/02/2020   Heart failure with reduced ejection fraction and diastolic dysfunction (Greybull) 09/07/2020   Near syncope 07/21/2020   Acid reflux 06/03/2020   Cannabis abuse 03/19/2020   Acute bronchitis due to Rhinovirus 09/08/2019   Schizoaffective disorder (Pote[place]) 06/24/2019   SOB (shortness of breath)    QT prolongation 05/25/2019   Cocaine abuse (Sarita)    Elevated troponin    Homeless    Suicidal ideation    Bilateral lower extremity pain 04/05/2019   Housing situation unstable 03/28/2019   Bilateral lower extremity edema    Acute exacerbation of CHF (congestive heart failure) ([place] Beach) 03/04/2019   Alcohol withdrawal syndrome, with delirium (Dumont) 05/03/2018   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[place]. [place]) 03/02/2018   Mild intermittent asthma without complication 01/60/1093   Asthma 12/26/2016   Tobacco use disorder 12/26/2016   Schizophrenia (Sangamon) 12/26/2016   Severe episode  of recurrent major depressive disorder, with psychotic features (Chesterfield) 10/31/2016   Cocaine abuse with cocaine-induced mood disorder (Whiteash) 09/06/2016   Alcohol use disorder, mild, abuse 07/31/2016   Substance induced mood disorder ([place] Vernia) 07/30/2016   Schizoaffective disorder, bipolar type (Vernon) 05/31/2016   Cocaine use disorder, severe, dependence (Rolling[place]) 05/31/2016   Major depressive disorder, recurrent episode ([place] Liberty) 05/30/2016   Suicidal behavior with attempted self-injury (Richmond) 05/08/2016   Acetaminophen overdose 05/06/2016   PCP:  Marliss Coots, NP Pharmacy:   [facility] [address] [place] [address] Phone: [phone] Fax: [place] [address] Phone: [phone] Fax: [phone]     Social Determinants of Health (SDOH) Social History: SDOH Screenings   Food Insecurity: Food Insecurity Present (11/24/2022)  Housing: Medium Risk (12/07/2022)  Transportation Needs: Unmet Transportation Needs (12/07/2022)  Utilities: Not At Risk (12/07/2022)  Alcohol Screen: Medium Risk (06/24/2019)  Depression (PHQ2-9): Medium Risk (02/10/2021)  Tobacco Use: High Risk (12/11/2022)   SDOH Interventions: Food Insecurity Interventions: Patient Refused Housing Interventions: Patient Refused Transportation Interventions: Payor Benefit Utilities Interventions: Intervention Not Indicated   Readmission Risk Interventions    12/09/2022    9:02 AM 12/01/2022    3:56 PM 08/18/2022    9:00 AM  Readmission Risk Prevention Plan  Transportation Screening Complete Complete Complete  Medication Review Press photographer) Complete Complete Complete  PCP or Specialist appointment within 3-5 days of discharge Complete Complete Complete  HRI or Home Care Consult Complete Complete Complete  SW Recovery Care/Counseling Consult Complete Complete Complete  Palliative Care Screening Not Applicable Not Applicable Not [place] Not Applicable Not Applicable Not Applicable  SNF Comments  n/a

## 2022-12-12 NOTE — Progress Notes (Signed)
Level 2 PASRR received [id number], expires 01/11/23. Added to FL2.   Wandra Feinstein, MSW, [name] [phone] (coverage)

## 2022-12-13 ENCOUNTER — Inpatient Hospital Stay (HOSPITAL_COMMUNITY): Payer: Medicaid Other

## 2022-12-13 DIAGNOSIS — I96 Gangrene, not elsewhere classified: Secondary | ICD-10-CM | POA: Diagnosis not present

## 2022-12-13 DIAGNOSIS — L03116 Cellulitis of left lower limb: Secondary | ICD-10-CM | POA: Diagnosis not present

## 2022-12-13 LAB — CBC WITH DIFFERENTIAL/PLATELET
Abs Immature Granulocytes: 0.02 10*3/uL (ref 0.00–0.07)
Basophils Absolute: 0.1 10*3/uL (ref 0.0–0.1)
Basophils Relative: 1 %
Eosinophils Absolute: 0.1 10*3/uL (ref 0.0–0.5)
Eosinophils Relative: 1 %
HCT: 37.5 % — ABNORMAL LOW (ref 39.0–52.0)
Hemoglobin: 11.8 g/dL — ABNORMAL LOW (ref 13.0–17.0)
Immature Granulocytes: 0 %
Lymphocytes Relative: 28 %
Lymphs Abs: 2.4 10*3/uL (ref 0.7–4.0)
MCH: 30.4 pg (ref 26.0–34.0)
MCHC: 31.5 g/dL (ref 30.0–36.0)
MCV: 96.6 fL (ref 80.0–100.0)
Monocytes Absolute: 1 10*3/uL (ref 0.1–1.0)
Monocytes Relative: 12 %
Neutro Abs: 4.9 10*3/uL (ref 1.7–7.7)
Neutrophils Relative %: 58 %
Platelets: 294 10*3/uL (ref 150–400)
RBC: 3.88 MIL/uL — ABNORMAL LOW (ref 4.22–5.81)
RDW: 15.9 % — ABNORMAL HIGH (ref 11.5–15.5)
WBC: 8.4 10*3/uL (ref 4.0–10.5)
nRBC: 0 % (ref 0.0–0.2)

## 2022-12-13 LAB — BASIC METABOLIC PANEL
Anion gap: 9 (ref 5–15)
BUN: 26 mg/dL — ABNORMAL HIGH (ref 6–20)
CO2: 24 mmol/L (ref 22–32)
Calcium: 8.5 mg/dL — ABNORMAL LOW (ref 8.9–10.3)
Chloride: 105 mmol/L (ref 98–111)
Creatinine, Ser: 1.22 mg/dL (ref 0.61–1.24)
GFR, Estimated: >60 mL/min (ref >60)
Glucose, Bld: 108 mg/dL — ABNORMAL HIGH (ref 70–99)
Potassium: 4.4 mmol/L (ref 3.5–5.1)
Sodium: 138 mmol/L (ref 135–145)

## 2022-12-13 LAB — BRAIN NATRIURETIC PEPTIDE: B Natriuretic Peptide: 867.1 pg/mL — ABNORMAL HIGH (ref 0.0–100.0)

## 2022-12-13 MED ORDER — FUROSEMIDE 10 MG/ML IJ SOLN
40.0000 mg | Freq: Every day | INTRAMUSCULAR | Status: DC
  Administered 2022-12-13 – 2022-12-15 (×3): 40 mg via INTRAVENOUS
  Filled 2022-12-13 (×4): qty 4

## 2022-12-13 NOTE — Progress Notes (Signed)
  Progress Note   Patient: [name] MVH:[id number] DOB: January 05, 1966 DOA: 12/07/2022     6 DOS: the patient was seen and examined on 12/13/2022   Brief hospital course: 57 y.o. male with medical history significant of chronic combined HF, CAD, HLD, polyneuropathy, bipolar d/o, COPD. Presenting with LLE chronic venous stasis wound pain. Pt was recently admitted for LLE wound infection. At the time he was seen by podiatry who recommended that he continue augmentin and outpt wound care. Pt reports compliance with the medication since discharge. However, he has had increasing pain since discharge. Of note, pt is homeless. Pt admitted for further management.    Assessment and Plan: Sepsis secondary to infected chronic venous stasis wounds of the left lower extremity. Orthopedics consulted and patient is scheduled for amputation of the left lower extremity. Patient was initially given empiric broad-spectrum IV antibiotics  Now s/p L BKA. Continue wound vac in place Cont analgesia as needed. Complaining of increased pain this AM    Acute on chronic systolic and diastolic heart failure Echocardiogram in September 2023 showed left ventricular ejection fraction of less than 20% with global hypokinesis. Pt reports drinking large amounts of sodas this stay, educated that sodas contain sodium Ordered and reviewed CXR, evidence of pulm edema. BNP elevated at 867.1 Currently not on diuretic. Will start IV lasix    COPD No wheezing heard on exam today. Lungs clear Unclear why pt documented needing 3LNC overnight. Denies sob Continued with nebulizers, bronchodilators as needed    Type 2 diabetes mellitus Continue with sliding scale insulin at this time.    History of DVT Patient is on Eliquis for anticoagulation.    Hypertension Blood pressure parameters are optimal.    History of alcohol/cocaine/tobacco abuse currently no withdrawal signs at this time. Cessation done at bedside       Subjective: complaining of increased sob, increased pain in stump  Physical Exam: Vitals:   12/12/22 0758 12/12/22 2019 12/13/22 0706 12/13/22 0728  BP: 123/85 123/88  (!) 134/55  Pulse: 96 89  98  Resp: 19   19  Temp: 98.2 F (36.8 C) 98.1 F (36.7 C)  98 F (36.7 C)  TempSrc: Oral Oral  Oral  SpO2: 100% 100% 96% 96%  Weight:      Height:       General exam: Awake, laying in bed, in nad Respiratory system: Normal respiratory effort, no wheezing Cardiovascular system: regular rate, s1, s2 Gastrointestinal system: Soft, nondistended, positive BS Central nervous system: CN2-12 grossly intact, strength intact Extremities: Perfused, no clubbing Skin: Normal skin turgor, no notable skin lesions seen Psychiatry: Mood normal // no visual hallucinations   Data Reviewed:  Labs reviewed: Na 138, K 4.4, Cr 1.22, WBC 8.4, Hgb 11.8   Family Communication: Pt in room, family not at bedside  Disposition: Status is: Inpatient Remains inpatient appropriate because: Severity of illness  Planned Discharge Destination: Skilled nursing facility    Author: Marylu Lund, MD 12/13/2022 5:04 PM  For on call review [email or web address].

## 2022-12-13 NOTE — Plan of Care (Signed)
  Problem: Fluid Volume: Goal: Hemodynamic stability will improve Outcome: Progressing   Problem: Clinical Measurements: Goal: Diagnostic test results will improve Outcome: Progressing Goal: Signs and symptoms of infection will decrease Outcome: Progressing

## 2022-12-13 NOTE — Plan of Care (Signed)
  Problem: Fluid Volume: Goal: Hemodynamic stability will improve 12/13/2022 2130 by Guinevere Scarlet, RN Outcome: Progressing 12/13/2022 2130 by Guinevere Scarlet, RN Outcome: Progressing   Problem: Clinical Measurements: Goal: Diagnostic test results will improve 12/13/2022 2130 by Guinevere Scarlet, RN Outcome: Progressing 12/13/2022 2130 by Guinevere Scarlet, RN Outcome: Progressing Goal: Signs and symptoms of infection will decrease 12/13/2022 2130 by Guinevere Scarlet, RN Outcome: Progressing 12/13/2022 2130 by Guinevere Scarlet, RN Outcome: Progressing

## 2022-12-14 DIAGNOSIS — I96 Gangrene, not elsewhere classified: Secondary | ICD-10-CM | POA: Diagnosis not present

## 2022-12-14 DIAGNOSIS — L03116 Cellulitis of left lower limb: Secondary | ICD-10-CM | POA: Diagnosis not present

## 2022-12-14 LAB — COMPREHENSIVE METABOLIC PANEL
ALT: 11 U/L (ref 0–44)
AST: 12 U/L — ABNORMAL LOW (ref 15–41)
Albumin: 2.9 g/dL — ABNORMAL LOW (ref 3.5–5.0)
Alkaline Phosphatase: 54 U/L (ref 38–126)
Anion gap: 10 (ref 5–15)
BUN: 28 mg/dL — ABNORMAL HIGH (ref 6–20)
CO2: 26 mmol/L (ref 22–32)
Calcium: 8.7 mg/dL — ABNORMAL LOW (ref 8.9–10.3)
Chloride: 101 mmol/L (ref 98–111)
Creatinine, Ser: 1.2 mg/dL (ref 0.61–1.24)
GFR, Estimated: >60 mL/min (ref >60)
Glucose, Bld: 99 mg/dL (ref 70–99)
Potassium: 4.1 mmol/L (ref 3.5–5.1)
Sodium: 137 mmol/L (ref 135–145)
Total Bilirubin: 0.4 mg/dL (ref 0.3–1.2)
Total Protein: 6.6 g/dL (ref 6.5–8.1)

## 2022-12-14 NOTE — Plan of Care (Signed)
  Problem: Fluid Volume: Goal: Hemodynamic stability will improve Outcome: Adequate for Discharge   Problem: Clinical Measurements: Goal: Diagnostic test results will improve Outcome: Adequate for Discharge Goal: Signs and symptoms of infection will decrease Outcome: Adequate for Discharge   

## 2022-12-14 NOTE — TOC Progression Note (Signed)
Transition of Care [facility]) - Progression Note    Patient Details  Name: [name] MRN: [id number] Date of Birth: [birth year]  Transition of Care [facility]) CM/SW Contact  Ina Homes, [place] Phone Number: 12/14/2022, 9:08 AM  Clinical Narrative:     Pt has no bed offers at this time  Expected Discharge Plan: [place] Barriers to Discharge: Continued Medical Work up, SNF Pending bed offer  Expected Discharge Plan and Services   Discharge Planning Services: CM Consult Post Acute Care Choice: [place] Living arrangements for the past 2 months: Homeless                                       Social Determinants of Health (SDOH) Interventions [place]: Food Insecurity Present (11/24/2022)  Housing: Medium Risk (12/07/2022)  Transportation Needs: Unmet Transportation Needs (12/07/2022)  Utilities: Not At Risk (12/07/2022)  Alcohol Screen: Medium Risk (06/24/2019)  Depression (PHQ2-9): Medium Risk (02/10/2021)  Tobacco Use: High Risk (12/11/2022)    Readmission Risk Interventions    12/09/2022    9:02 AM 12/01/2022    3:56 PM 08/18/2022    9:00 AM  Readmission Risk Prevention Plan  Transportation Screening Complete Complete Complete  Medication Review Press photographer) Complete Complete Complete  PCP or Specialist appointment within 3-5 days of discharge Complete Complete Complete  HRI or Home Care Consult Complete Complete Complete  SW Recovery Care/Counseling Consult Complete Complete Complete  Palliative Care Screening Not Applicable Not Applicable Not [place] Not Applicable Not Applicable Not Applicable  SNF Comments  n/a

## 2022-12-14 NOTE — Progress Notes (Signed)
  Progress Note   Patient: [name] IRC:[id number] DOB: [birth year] DOA: 12/07/2022     7 DOS: the patient was seen and examined on 12/14/2022   Brief hospital course: 57 y.o. male with medical history significant of chronic combined HF, CAD, HLD, polyneuropathy, bipolar d/o, COPD. Presenting with LLE chronic venous stasis wound pain. Pt was recently admitted for LLE wound infection. At the time he was seen by podiatry who recommended that he continue augmentin and outpt wound care. Pt reports compliance with the medication since discharge. However, he has had increasing pain since discharge. Of note, pt is homeless. Pt admitted for further management.    Assessment and Plan: Sepsis secondary to infected chronic venous stasis wounds of the left lower extremity. Orthopedics consulted and patient is scheduled for amputation of the left lower extremity. Patient was initially given empiric broad-spectrum IV antibiotics  Now s/p L BKA. Continue wound vac in place Per orthopedic surgery, plan for removal of VAC and apply dry dressing at day of d/c    Acute on chronic systolic and diastolic heart failure Echocardiogram in September 2023 showed left ventricular ejection fraction of less than 20% with global hypokinesis. Pt reports drinking large amounts of sodas this stay, educated that sodas contain sodium Also noted multiple ketchup packets used, educated Ordered and reviewed CXR, evidence of pulm edema. BNP elevated at 867.1 Improvement with IV lasix thus far    COPD No wheezing heard on exam today. Lungs clear Unclear why pt documented needing 3LNC overnight. Denies sob Continued with nebulizers, bronchodilators as needed    Type 2 diabetes mellitus Continue with sliding scale insulin at this time.    History of DVT Patient is on Eliquis for anticoagulation.    Hypertension Blood pressure parameters are optimal.    History of alcohol/cocaine/tobacco abuse currently no withdrawal  signs at this time. Cessation done at bedside      Subjective: Still with continued stump pain, described as increased "pressure" from wound vac  Physical Exam: Vitals:   12/13/22 0706 12/13/22 0728 12/13/22 2035 12/14/22 0307  BP:  (!) 134/55 109/74 (!) 143/91  Pulse:  98 92 99  Resp:  19 18 20   Temp:  98 F (36.7 C) 98.2 F (36.8 C) 98.4 F (36.9 C)  TempSrc:  Oral Oral Oral  SpO2: 96% 96% 100% 98%  Weight:      Height:       General exam: Conversant, in no acute distress Respiratory system: normal chest rise, clear, no audible wheezing Cardiovascular system: regular rhythm, s1-s2 Gastrointestinal system: Nondistended, nontender, pos BS Central nervous system: No seizures, no tremors Extremities: No cyanosis, no joint deformities Skin: No rashes, no pallor Psychiatry: Affect normal // no auditory hallucinations   Data Reviewed:  Labs reviewed: Na 137, K 4.1, Cr 1.20   Family Communication: Pt in room, family not at bedside  Disposition: Status is: Inpatient Remains inpatient appropriate because: Severity of illness  Planned Discharge Destination: Skilled nursing facility    Author: Marylu Lund, MD 12/14/2022 4:22 PM  For on call review [email or web address].

## 2022-12-15 DIAGNOSIS — I96 Gangrene, not elsewhere classified: Secondary | ICD-10-CM | POA: Diagnosis not present

## 2022-12-15 DIAGNOSIS — L03116 Cellulitis of left lower limb: Secondary | ICD-10-CM | POA: Diagnosis not present

## 2022-12-15 LAB — CBC
HCT: 34.7 % — ABNORMAL LOW (ref 39.0–52.0)
Hemoglobin: 11.5 g/dL — ABNORMAL LOW (ref 13.0–17.0)
MCH: 31.3 pg (ref 26.0–34.0)
MCHC: 33.1 g/dL (ref 30.0–36.0)
MCV: 94.3 fL (ref 80.0–100.0)
Platelets: 282 10*3/uL (ref 150–400)
RBC: 3.68 MIL/uL — ABNORMAL LOW (ref 4.22–5.81)
RDW: 15.3 % (ref 11.5–15.5)
WBC: 7.8 10*3/uL (ref 4.0–10.5)
nRBC: 0 % (ref 0.0–0.2)

## 2022-12-15 LAB — COMPREHENSIVE METABOLIC PANEL
ALT: 11 U/L (ref 0–44)
AST: 14 U/L — ABNORMAL LOW (ref 15–41)
Albumin: 2.9 g/dL — ABNORMAL LOW (ref 3.5–5.0)
Alkaline Phosphatase: 52 U/L (ref 38–126)
Anion gap: 9 (ref 5–15)
BUN: 23 mg/dL — ABNORMAL HIGH (ref 6–20)
CO2: 23 mmol/L (ref 22–32)
Calcium: 8.6 mg/dL — ABNORMAL LOW (ref 8.9–10.3)
Chloride: 102 mmol/L (ref 98–111)
Creatinine, Ser: 0.81 mg/dL (ref 0.61–1.24)
GFR, Estimated: >60 mL/min (ref >60)
Glucose, Bld: 126 mg/dL — ABNORMAL HIGH (ref 70–99)
Potassium: 4.2 mmol/L (ref 3.5–5.1)
Sodium: 134 mmol/L — ABNORMAL LOW (ref 135–145)
Total Bilirubin: 0.6 mg/dL (ref 0.3–1.2)
Total Protein: 6.4 g/dL — ABNORMAL LOW (ref 6.5–8.1)

## 2022-12-15 NOTE — Progress Notes (Signed)
Mobility Specialist Progress Note   12/15/22 1102  Pain Assessment  Pain Assessment Faces  Pain Score 8  Negative Vocalization 1  Facial Expression 1  Pain Intervention(s) Patient requesting pain meds-RN notified  Mobility  Activity Transferred to/from [facility]  Level of Assistance Moderate assist, patient does 50-74%  Assistive Device  (HHA + BSC)  LLE Weight Bearing NWB  Activity Response Tolerated well  Mobility Referral Yes  $Mobility charge 1 Mobility   Received pt in bed on 2LO2 expressing pain(6/10) in LLE but agreeable to mobility. Requesting to use BSC prior to starting, AP transfer to [facility] w/ minA to pull pad when scooting. Successful BM. AP scoot back to bed w/ ModA d/t increased pain(8/10) and fatigue. Assisted in pericare once in bed. When finished pt deferred further mobility d/t increased pain and fatigue. Bed alarm w/ call bell in reach and RN notified.    Holland Falling Mobility Specialist Please contact via SecureChat or  Rehab office at [phone]

## 2022-12-15 NOTE — Progress Notes (Signed)
  Progress Note   Patient: [name] JJH:[id number] DOB: 15-Nov-1966 DOA: 12/07/2022     8 DOS: the patient was seen and examined on 12/15/2022   Brief hospital course: 57 y.o. male with medical history significant of chronic combined HF, CAD, HLD, polyneuropathy, bipolar d/o, COPD. Presenting with LLE chronic venous stasis wound pain. Pt was recently admitted for LLE wound infection. At the time he was seen by podiatry who recommended that he continue augmentin and outpt wound care. Pt reports compliance with the medication since discharge. However, he has had increasing pain since discharge. Of note, pt is homeless. Pt admitted for further management.    Assessment and Plan: Sepsis secondary to infected chronic venous stasis wounds of the left lower extremity. Orthopedics consulted and patient is scheduled for amputation of the left lower extremity. Patient was initially given empiric broad-spectrum IV antibiotics  Now s/p L BKA. Continue wound vac in place Wound VAC in place, however, no seal. Discussed with Dr. Sharol Given. Plan to remove VAC and change to dry dressings    Acute on chronic systolic and diastolic heart failure Echocardiogram in September 2023 showed left ventricular ejection fraction of less than 20% with global hypokinesis. Pt reports drinking large amounts of sodas this stay, educated that sodas contain sodium Also noted multiple ketchup packets used, educated Ordered and reviewed CXR, evidence of pulm edema. BNP elevated at 867.1 Now improved with IV lasix    COPD No wheezing heard on exam today. Lungs clear Unclear why pt documented needing 3LNC overnight. Denies sob Continued with nebulizers, bronchodilators as needed    Type 2 diabetes mellitus Continue with sliding scale insulin at this time.    History of DVT Patient is on Eliquis for anticoagulation.    Hypertension Blood pressure parameters are optimal.    History of alcohol/cocaine/tobacco abuse currently  no withdrawal signs at this time. Cessation done at bedside      Subjective: Complaining of stump pain. Overall feeling better today  Physical Exam: Vitals:   12/14/22 0307 12/14/22 2157 12/15/22 0939 12/15/22 1512  BP: (!) 143/91 (!) 149/110 (!) 158/92 (!) 142/105  Pulse: 99 93 91 (!) 103  Resp: 20 18    Temp: 98.4 F (36.9 C) 98 F (36.7 C) 97.7 F (36.5 C) 97.8 F (36.6 C)  TempSrc: Oral Oral Oral Oral  SpO2: 98% 99% 97% 99%  Weight:      Height:       General exam: Conversant, in no acute distress Respiratory system: normal chest rise, clear, no audible wheezing Cardiovascular system: regular rhythm, s1-s2 Gastrointestinal system: Nondistended, nontender, pos BS Central nervous system: No seizures, no tremors Extremities: No cyanosis, no joint deformities Skin: No rashes, no pallor Psychiatry: Affect normal // no auditory hallucinations   Data Reviewed:  Labs reviewed: Na 134, K 4.2, Cr 0.81   Family Communication: Pt in room, family not at bedside  Disposition: Status is: Inpatient Remains inpatient appropriate because: Severity of illness  Planned Discharge Destination: Skilled nursing facility    Author: Marylu Lund, MD 12/15/2022 4:24 PM  For on call review [email or web address].

## 2022-12-15 NOTE — TOC Progression Note (Signed)
Transition of Care [facility]) - Progression Note    Patient Details  Name: [name] MRN: [id number] Date of Birth: Oct 18, 1966  Transition [facility] Care [facility]) CM/SW Contact  Joanne Chars, [name] Phone Number: 12/15/2022, 11:46 AM  Clinical Narrative:    Pt has no SNF bed offers.  CSW spoke with pt in room regarding DC plan and also SDOH: food, housing, transportation.  Pt confirms he is homeless and [place]  Pt reports people give him things to eat sometimes and sometimes he is just hungry.  When asked about transportation, pt did take out a notebook with a number for [facility] and said he can call this phone number and they will pick him up.  Pt reports he does not have a PCP and has not been to the Dr at all recently.  Pt has wheelchair in the room with him, reports he has been to the [name] in the past.  He has been to the [facility] also but is not interested in returning because they were not helpful.    Pt contact is listed as Philipp Deputy, pt reports Hilda Blades is a Education officer, museum who used to help him, but he has not talked to her in a while.  Pt reports he has family in [place] but he does not have contact with them.   Pt confirms ongoing use of cocaine, would be interested in drug treatment.  Reports he cannot go to [employer] because he cannot work.  He was in drug treatment at [facility] in [place], and this was a helpful program for him.    Discussed current recommendation for SNF, discussed the barriers to being placed in SNF. Pt gives permission for CSW to expand the SNF search, which was done.      Expected Discharge Plan: [place] Barriers to Discharge: Continued Medical Work up, SNF Pending bed offer  Expected Discharge Plan and Services   Discharge Planning Services: CM Consult Post Acute Care Choice: [place] Living arrangements for the past 2 months: Homeless                                        Social Determinants of Health (SDOH) Interventions [place]: Food Insecurity Present (11/24/2022)  Housing: Medium Risk (12/07/2022)  Transportation Needs: Unmet Transportation Needs (12/07/2022)  Utilities: Not At Risk (12/07/2022)  Alcohol Screen: Medium Risk (06/24/2019)  Depression (PHQ2-9): Medium Risk (02/10/2021)  Tobacco Use: High Risk (12/11/2022)    Readmission Risk Interventions    12/09/2022    9:02 AM 12/01/2022    3:56 PM 08/18/2022    9:00 AM  Readmission Risk Prevention Plan  Transportation Screening Complete Complete Complete  Medication Review Press photographer) Complete Complete Complete  PCP or Specialist appointment within 3-5 days of discharge Complete Complete Complete  HRI or Home Care Consult Complete Complete Complete  SW Recovery Care/Counseling Consult Complete Complete Complete  Palliative Care Screening Not Applicable Not Applicable Not [place] Not Applicable Not Applicable Not Applicable  SNF Comments  n/a

## 2022-12-15 NOTE — Progress Notes (Signed)
Physical Therapy Treatment Patient Details Name: [name] MRN: [id number] DOB: Apr 30, 1966 Today's Date: 12/15/2022   History of Present Illness Pt is a 57yo M who presented 12/17/22 with L ankle cellulitis and underwent L BKA on 1/10. PMH includes asthma, bioplar 1, depression, schizoaffective disorder, CVA, chronic CHF, homelessness, HTN, DM2, cocaine and cannabis use and R AKA.    PT Comments    Pt received in bed, continuing to work on transfers in and out of w/c. Pt safe with posterior transfer into w/c. Last session worked on anterior transfer but pt tends to want to bear wt through L residual limb so this time tried lateral transfer but pt not as safe with this. Needed close guarding and once pt partially onto bed he decided to roll into prone position instead of continuing to scoot all the way into bed.  He did succeed in getting self into bed but still more pressure on LLE than necessary. Worked on education for positioning and practicing amputee exercises but pt's learning is limited by attention deficits and self distraction. Pt keeping L knee in partially flexed position making it hard for him to fully extend or flex. Pt with minimal understanding of the importance of this. Continue to recommend SNF at d/c as pt would not be safe on his own. PT will continue to follow.   Recommendations for follow up therapy are one component of a multi-disciplinary discharge planning process, led by the attending physician.  Recommendations may be updated based on patient status, additional functional criteria and insurance authorization.  Follow Up Recommendations  Skilled nursing-short term rehab (<3 hours/day) Can patient physically be transported by private vehicle: No   Assistance Recommended at Discharge Frequent or constant Supervision/Assistance  Patient can return home with the following Assistance with cooking/housework;A little help with bathing/dressing/bathroom   Equipment  Recommendations  None recommended by PT    Recommendations for Other Services       Precautions / Restrictions Precautions Precautions: Fall Precaution Comments: R AKA, L BKA Restrictions Weight Bearing Restrictions: Yes LLE Weight Bearing: Non weight bearing Other Position/Activity Restrictions: L wound vac     Mobility  Bed Mobility Overal bed mobility: Needs Assistance Bed Mobility: Rolling, Supine to Sit Rolling: Modified independent (Device/Increase time)   Supine to sit: Modified independent (Device/Increase time)     General bed mobility comments: pt able to roll onto stomach and does so when transferring w/c back to bed. He can also sit up into long sitting. He is very avoidant of rolling onto his back and maintains L SL. Cannot really explain why he can't tolerate supine except that it's not comfortable.    Transfers Overall transfer level: Needs assistance Equipment used: None Transfers: Bed to chair/wheelchair/BSC         Anterior-Posterior transfers: Supervision  Lateral/Scoot Transfers: Min guard General transfer comment: posterior transfer into w/c with supervision. Worked on lateral transfer back to bed and pt needed closer guarding as was not as safe with this. Scooted partway onto bed and then went into prone position on bed to get himself the rest of the way    Ambulation/Gait               General Gait Details: unable   Stairs             Wheelchair Mobility    Modified Rankin (Stroke Patients Only)       Balance Overall balance assessment: Modified Independent Sitting-balance support: No upper extremity supported Sitting balance-Leahy  Scale: Good Sitting balance - Comments: pt able to sit without support                                    Cognition Arousal/Alertness: Awake/alert Behavior During Therapy: Flat affect Overall Cognitive Status: No family/caregiver present to determine baseline cognitive  functioning Area of Impairment: Memory, Safety/judgement, Awareness, Problem solving, Attention                   Current Attention Level: Sustained Memory: Decreased recall of precautions   Safety/Judgement: Decreased awareness of deficits, Decreased awareness of safety   Problem Solving: Slow processing, Requires verbal cues, Requires tactile cues General Comments: continues to be difficult to get pt to attend to conversation to do education. He is unable to do teach back except with very basic principles like the need to work on bending and straightening L knee. But he needed multiple bouts of eduaction before he could repeat that. And still does not seem to understand general concepts of positioning and the need to keep full ROM of L knee.        Exercises Amputee Exercises Quad Sets: AROM, Left, 10 reps, Seated Knee Flexion: Left, 10 reps, AAROM, Seated Knee Extension: AAROM, Left, 10 reps, Seated    General Comments General comments (skin integrity, edema, etc.): SPO2 90% on RA      Pertinent Vitals/Pain Pain Assessment Pain Assessment: Faces Faces Pain Scale: Hurts even more Pain Location: L residual limb Pain Descriptors / Indicators: Operative site guarding, Sharp, Jabbing Pain Intervention(s): Limited activity within patient's tolerance, Monitored during session    Home Living                          Prior Function            PT Goals (current goals can now be found in the care plan section) Acute Rehab PT Goals Patient Stated Goal: decreased pain PT Goal Formulation: With patient Time For Goal Achievement: 12/25/22 Potential to Achieve Goals: Good Progress towards PT goals: Progressing toward goals    Frequency    Min 3X/week      PT Plan Current plan remains appropriate    Co-evaluation              AM-PAC PT "6 Clicks" Mobility   Outcome Measure  Help needed turning from your back to your side while in a flat bed  without using bedrails?: None Help needed moving from lying on your back to sitting on the side of a flat bed without using bedrails?: None Help needed moving to and from a bed to a chair (including a wheelchair)?: A Little Help needed standing up from a chair using your arms (e.g., wheelchair or bedside chair)?: Total Help needed to walk in hospital room?: Total Help needed climbing 3-5 steps with a railing? : Total 6 Click Score: 14    End of Session Equipment Utilized During Treatment: Gait belt Activity Tolerance: Patient tolerated treatment well Patient left: in bed;with call bell/phone within reach;with bed alarm set Nurse Communication: Mobility status PT Visit Diagnosis: Muscle weakness (generalized) (M62.81);Difficulty in walking, not elsewhere classified (R26.2);Other abnormalities of gait and mobility (R26.89)     Time: 7482-7078 PT Time Calculation (min) (ACUTE ONLY): 36 min  Charges:  $Therapeutic Exercise: 8-22 mins $Therapeutic Activity: 8-22 mins  Lyanne Co, PT  Acute Rehab Services Secure chat preferred Office [phone]    Lawana Chambers Laterica Matarazzo 12/15/2022, 1:10 PM

## 2022-12-16 DIAGNOSIS — I96 Gangrene, not elsewhere classified: Secondary | ICD-10-CM | POA: Diagnosis not present

## 2022-12-16 DIAGNOSIS — L03116 Cellulitis of left lower limb: Secondary | ICD-10-CM | POA: Diagnosis not present

## 2022-12-16 LAB — COMPREHENSIVE METABOLIC PANEL
ALT: 12 U/L (ref 0–44)
AST: 12 U/L — ABNORMAL LOW (ref 15–41)
Albumin: 3 g/dL — ABNORMAL LOW (ref 3.5–5.0)
Alkaline Phosphatase: 57 U/L (ref 38–126)
Anion gap: 8 (ref 5–15)
BUN: 20 mg/dL (ref 6–20)
CO2: 27 mmol/L (ref 22–32)
Calcium: 8.9 mg/dL (ref 8.9–10.3)
Chloride: 102 mmol/L (ref 98–111)
Creatinine, Ser: 0.9 mg/dL (ref 0.61–1.24)
GFR, Estimated: >60 mL/min (ref >60)
Glucose, Bld: 99 mg/dL (ref 70–99)
Potassium: 4.4 mmol/L (ref 3.5–5.1)
Sodium: 137 mmol/L (ref 135–145)
Total Bilirubin: 0.5 mg/dL (ref 0.3–1.2)
Total Protein: 7 g/dL (ref 6.5–8.1)

## 2022-12-16 LAB — GLUCOSE, CAPILLARY: Glucose-Capillary: 108 mg/dL — ABNORMAL HIGH (ref 70–99)

## 2022-12-16 MED ORDER — FUROSEMIDE 40 MG PO TABS
40.0000 mg | ORAL_TABLET | Freq: Every day | ORAL | Status: DC
  Administered 2022-12-16 – 2023-04-28 (×129): 40 mg via ORAL
  Filled 2022-12-16 (×135): qty 1

## 2022-12-16 MED ORDER — COLLAGENASE 250 UNIT/GM EX OINT
TOPICAL_OINTMENT | Freq: Every day | CUTANEOUS | Status: DC
  Filled 2022-12-16: qty 30

## 2022-12-16 NOTE — Consult Note (Signed)
WOC consulted for amputation site, Dr.Duda is managing this wound. See notes, he will see patient and remove Prevena and provide wound care orders at that time.   Will not consult for that reason. [place], [place], Steward

## 2022-12-16 NOTE — Plan of Care (Signed)
  Problem: Education: Goal: Knowledge of General Education information will improve Description: Including pain rating scale, medication(s)/side effects and non-pharmacologic comfort measures Outcome: Progressing   Problem: Clinical Measurements: Goal: Will remain free from infection Outcome: Progressing   Problem: Activity: Goal: Risk for activity intolerance will decrease Outcome: Progressing   Problem: Pain Managment: Goal: General experience of comfort will improve Outcome: Progressing   Problem: Skin Integrity: Goal: Risk for impaired skin integrity will decrease Outcome: Progressing   Problem: Education: Goal: Knowledge of General Education information will improve Description: Including pain rating scale, medication(s)/side effects and non-pharmacologic comfort measures Outcome: Progressing

## 2022-12-16 NOTE — Progress Notes (Signed)
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 PMH includes asthma, bioplar 1, depression, schizoaffective disorder, CVA, chronic CHF, homelessness, HTN, DM2, cocaine and cannabis use and R AKA.   OT comments  Pt in bed upon therapy arrival and agreeable to participate in OT treatment session. Initially upset upon arrival regarding change in routine with nursing. Pt reports that he is worried about the wound on his Right residual limb and did not want it "just covered up." Session with focus on functional transfers and BUE strength/endurance. Pt provided with education for lateral scoot transfer including hand placement of left arm closer to body to provide greatest strength and support versus performing an extended reach for arm rest. Pt unable to carry over education to attempt transfer safely and AP transfer was performed instead. Pt able to complete AP transfer without physical assist. OT stabilized wheelchair for safety.    Recommendations for follow up therapy are one component of a multi-disciplinary discharge planning process, led by the attending physician.  Recommendations may be updated based on patient status, additional functional criteria and insurance authorization.    Follow Up Recommendations  Skilled nursing-short term rehab (<3 hours/day)     Assistance Recommended at Discharge PRN  Patient can return home with the following  A little help with walking and/or transfers;A little help with bathing/dressing/bathroom;Help with stairs or ramp for entrance;Assist for transportation   Equipment Recommendations  Other (comment) (defer to next venue of care)       Precautions / Restrictions Precautions Precautions: Fall Precaution Comments: R AKA, L BKA Restrictions Weight Bearing  Restrictions: Yes LLE Weight Bearing: Non weight bearing Other Position/Activity Restrictions: L wound vac       Mobility Bed Mobility Overal bed mobility:  (pt sitting up in bed upon therapy arrival)       Patient Response: Cooperative, Flat affect  Transfers Overall transfer level: Needs assistance Equipment used: None Transfers: Bed to chair/wheelchair/BSC         Anterior-Posterior transfers: Supervision (OT stabilized w/c)  Lateral/Scoot Transfers:  (Attempted. Unable to complete.) General transfer comment: Attempted to work on lateral transfer from bed to w/c. Pt was able to scoot towards EOB with w/c up next to bed. Provided education on safe hand placement and technique prior to transfer attempt. Pt required increased processing time and encouragement from OT to complete although he declined technique and requested to do AP transfer instead.     Balance Overall balance assessment: Modified Independent Sitting-balance support: No upper extremity supported Sitting balance-Leahy Scale: Good Sitting balance - Comments: pt able to sit without support       ADL either performed or assessed with clinical judgement   ADL     Toileting - Clothing Manipulation Details (indicate cue type and reason): Pt managed urinal independently to complete toileting while seated in w/c.              Cognition Arousal/Alertness: Awake/alert Behavior During Therapy: Flat affect Overall Cognitive Status: No family/caregiver present to determine baseline cognitive functioning Area of Impairment: Memory, Safety/judgement, Awareness, Problem solving, Attention     Memory: Decreased recall of precautions, Decreased short-term memory   Safety/Judgement: Decreased awareness of deficits, Decreased awareness of safety   Problem Solving: Slow processing, Requires verbal cues, Requires tactile cues General Comments: Difficulty with carry over education  and did not demonstrate  understanding provided related to use of limb protector, safe transfer technique with both attempt transfer techniques.        Exercises Other Exercises Other Exercises: Pt completed wheelchair mobility while self propelling w/c around full diameter of unit focusing BUE strength and endurance. SBA provided for directional navigation and safety.       General Comments Pt initially on supplemental oxygen at 3 L. On oxygen, SpO2: 98%. Oxygen removed with pt at 95-96% on room air throughout session.    Pertinent Vitals/ Pain       Pain Assessment Pain Assessment: Faces Faces Pain Scale: Hurts little more Pain Location: L residual limb Pain Descriptors / Indicators: Operative site guarding, Sharp, Jabbing Pain Intervention(s): Limited activity within patient's tolerance, Monitored during session         Frequency  Min 2X/week        Progress Toward Goals  OT Goals(current goals can now be found in the care plan section)  Progress towards OT goals: Progressing toward goals     Plan Discharge plan remains appropriate;Frequency remains appropriate       AM-PAC OT "6 Clicks" Daily Activity     Outcome Measure   Help from another person eating meals?: None Help from another person taking care of personal grooming?: None Help from another person toileting, which includes using toliet, bedpan, or urinal?: A Little Help from another person bathing (including washing, rinsing, drying)?: A Little Help from another person to put on and taking off regular upper body clothing?: None Help from another person to put on and taking off regular lower body clothing?: A Little 6 Click Score: 21    End of Session Equipment Utilized During Treatment: Oxygen  OT Visit Diagnosis: Muscle weakness (generalized) (M62.81)   Activity Tolerance Patient tolerated treatment well   Patient Left in chair;with call bell/phone within reach   Nurse Communication Mobility status        Time:  6578-4696 OT Time Calculation (min): 47 min  Charges: OT General Charges $OT Visit: 1 Visit OT Treatments $Self Care/Home Management : 23-37 mins $Therapeutic Activity: 8-22 mins  Ailene Ravel, OTR/L,CBIS  Supplemental OT - [facility] and [employer] Secure Chat Preferred    Okie Jansson, [name] 12/16/2022, 1:21 PM

## 2022-12-16 NOTE — Progress Notes (Signed)
  Progress Note   Patient: [name] KDX:[id number] DOB: 25-Jul-1966 DOA: 12/07/2022     9 DOS: the patient was seen and examined on 12/16/2022   Brief hospital course: 57 y.o. male with medical history significant of chronic combined HF, CAD, HLD, polyneuropathy, bipolar d/o, COPD. Presenting with LLE chronic venous stasis wound pain. Pt was recently admitted for LLE wound infection. At the time he was seen by podiatry who recommended that he continue augmentin and outpt wound care. Pt reports compliance with the medication since discharge. However, he has had increasing pain since discharge. Of note, pt is homeless. Pt admitted for further management.    Assessment and Plan: Sepsis secondary to infected chronic venous stasis wounds of the left lower extremity. Orthopedics consulted and patient is scheduled for amputation of the left lower extremity. Patient was initially given empiric broad-spectrum IV antibiotics  Now s/p L BKA.  Wound VAC in place, however, no seal. Discussed with Dr. Sharol Given. Recs to remove VAC and change to dry dressings per Dr. Sharol Given 1/15, however pt reluctant to allow RN to change, will notify Dr. Sharol Given    Acute on chronic systolic and diastolic heart failure Echocardiogram in September 2023 showed left ventricular ejection fraction of less than 20% with global hypokinesis. Pt reports drinking large amounts of sodas this stay, educated that sodas contain sodium Also noted multiple ketchup packets used, educated.  Recent CXR with evidence of pulm edema. BNP elevated at 867.1 Now improved with IV lasix. Changed to PO lasix today    COPD No wheezing heard on exam today. Lungs clear Continued with nebulizers, bronchodilators as needed    Type 2 diabetes mellitus Continue with sliding scale insulin at this time. Suprisingly decent glucose trends despite being on a regular diet    History of DVT Patient is on Eliquis for anticoagulation.    Hypertension Blood pressure  parameters are optimal.    History of alcohol/cocaine/tobacco abuse currently no withdrawal signs at this time. Cessation done at bedside      Subjective: Reports pain is improved today. Extremely hesitant for RN to remove [facility]  Physical Exam: Vitals:   12/15/22 1512 12/15/22 2204 12/16/22 0733 12/16/22 1437  BP: (!) 142/105 (!) 158/108 (!) 151/109 (!) 115/92  Pulse: (!) 103 97 98 90  Resp:  18    Temp: 97.8 F (36.6 C) 98.6 F (37 C) 97.6 F (36.4 C) 97.6 F (36.4 C)  TempSrc: Oral Oral Oral Oral  SpO2: 99% 98% 100% 97%  Weight:      Height:       General exam: Awake, laying in bed, in nad Respiratory system: Normal respiratory effort, no wheezing Cardiovascular system: regular rate, s1, s2 Gastrointestinal system: Soft, nondistended, positive BS Central nervous system: CN2-12 grossly intact, strength intact Extremities: BLE amputations, wound VAC remains on LLE Skin: LLE:    RLE:   Psychiatry: Mood normal // no visual hallucinations   Data Reviewed:  Labs reviewed: Na 137, K 4.4, Cr 0.90   Family Communication: Pt in room, family not at bedside  Disposition: Status is: Inpatient Remains inpatient appropriate because: Severity of illness  Planned Discharge Destination: Skilled nursing facility    Author: Marylu Lund, MD 12/16/2022 5:09 PM  For on call review [email or web address].

## 2022-12-17 DIAGNOSIS — I96 Gangrene, not elsewhere classified: Secondary | ICD-10-CM | POA: Diagnosis not present

## 2022-12-17 DIAGNOSIS — L03116 Cellulitis of left lower limb: Secondary | ICD-10-CM | POA: Diagnosis not present

## 2022-12-17 NOTE — Plan of Care (Signed)

## 2022-12-17 NOTE — TOC Progression Note (Signed)
Transition of Care [facility]) - Progression Note    Patient Details  Name: [name] MRN: [id number] Date of Birth: [birth year]  [facility] CM/SW Contact  Joanne Chars, [name] Phone Number: 12/17/2022, 11:21 AM  Clinical Narrative:   No SNF bed offers.  CSW spoke with Whitney/Central intake for [facility].  Referral resent, she will send a representative for face to face eval of the pt and then can make a decision on whether they can offer a bed.  CSW met with pt, explained the above, pt agreeable.     Expected Discharge Plan: [place] Barriers to Discharge: Continued Medical Work up, SNF Pending bed offer  Expected Discharge Plan and Services   Discharge Planning Services: CM Consult Post Acute Care Choice: [place] Living arrangements for the past 2 months: Homeless                                       Social Determinants of Health (SDOH) Interventions [place]: Food Insecurity Present (11/24/2022)  Housing: Medium Risk (12/07/2022)  Transportation Needs: Unmet Transportation Needs (12/07/2022)  Utilities: Not At Risk (12/07/2022)  Alcohol Screen: Medium Risk (06/24/2019)  Depression (PHQ2-9): Medium Risk (02/10/2021)  Tobacco Use: High Risk (12/11/2022)    Readmission Risk Interventions    12/09/2022    9:02 AM 12/01/2022    3:56 PM 08/18/2022    9:00 AM  Readmission Risk Prevention Plan  Transportation Screening Complete Complete Complete  Medication Review Press photographer) Complete Complete Complete  PCP or Specialist appointment within 3-5 days of discharge Complete Complete Complete  HRI or Home Care Consult Complete Complete Complete  SW Recovery Care/Counseling Consult Complete Complete Complete  Palliative Care Screening Not Applicable Not Applicable Not [place] Not Applicable Not Applicable Not Applicable  SNF Comments  n/a

## 2022-12-17 NOTE — Progress Notes (Signed)
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 PMH includes asthma, bioplar 1, depression, schizoaffective disorder, CVA, chronic CHF, homelessness, HTN, DM2, cocaine and cannabis use and R AKA.    PT Comments    Patient with low frustration tolerance and in pain with RN having medicated recently.  Reports unable to straighten L knee due to pain and once sitting declined OOB due to PT attempting to assist.  PT will continue to follow.    Recommendations for follow up therapy are one component of a multi-disciplinary discharge planning process, led by the attending physician.  Recommendations may be updated based on patient status, additional functional criteria and insurance authorization.  Follow Up Recommendations  Skilled nursing-short term rehab (<3 hours/day) Can patient physically be transported by private vehicle: No   Assistance Recommended at Discharge Frequent or constant Supervision/Assistance  Patient can return home with the following Assistance with cooking/housework;A little help with bathing/dressing/bathroom   Equipment Recommendations  None recommended by PT    Recommendations for Other Services       Precautions / Restrictions Precautions Precautions: Fall Precaution Comments: R AKA, L BKA Restrictions Weight Bearing Restrictions: Yes LLE Weight Bearing: Non weight bearing Other Position/Activity Restrictions: L wound vac     Mobility  Bed Mobility     Rolling: Modified independent (Device/Increase time)   Supine to sit: Modified independent (Device/Increase time) Sit to supine: Modified independent (Device/Increase time)   General bed mobility comments: up to sit on EOB from sidelying, laying on side upon entry, cannot tolerate laying flat, encouraged knee  extension    Transfers     Transfers: Bed to chair/wheelchair/BSC             General transfer comment: patient initated lateral scooting, but was agitated from conversation about L knee extension and easily frustrated by PT intervention so declined further mobility and returned to sidelying.    Ambulation/Gait                   Stairs             Wheelchair Mobility    Modified Rankin (Stroke Patients Only)       Balance Overall balance assessment: Modified Independent                                          Cognition Arousal/Alertness: Awake/alert Behavior During Therapy: Agitated Overall Cognitive Status: No family/caregiver present to determine baseline cognitive functioning Area of Impairment: Memory, Safety/judgement, Awareness, Problem solving, Attention                   Current Attention Level: Sustained Memory: Decreased recall of precautions, Decreased short-term memory   Safety/Judgement: Decreased awareness of deficits, Decreased awareness of safety   Problem Solving: Slow processing, Requires verbal cues, Requires tactile cues          Exercises Other Exercises Other Exercises: performed one repitition L knee extension    General Comments        Pertinent Vitals/Pain Pain Assessment Faces Pain Scale: Hurts whole lot Pain Location: L residual limb Pain Descriptors / Indicators: Operative site guarding, Sharp, Jabbing Pain Intervention(s): Monitored during session, Limited activity within patient's tolerance  Home Living                          Prior Function            PT Goals (current goals can now be found in the care plan section) Progress towards PT goals: Not progressing toward goals - comment (self limited)    Frequency    Min 3X/week      PT Plan Current plan remains appropriate    Co-evaluation              AM-PAC PT "6 Clicks" Mobility   Outcome  Measure  Help needed turning from your back to your side while in a flat bed without using bedrails?: None Help needed moving from lying on your back to sitting on the side of a flat bed without using bedrails?: None Help needed moving to and from a bed to a chair (including a wheelchair)?: A Little Help needed standing up from a chair using your arms (e.g., wheelchair or bedside chair)?: Total Help needed to walk in hospital room?: Total Help needed climbing 3-5 steps with a railing? : Total 6 Click Score: 14    End of Session   Activity Tolerance: Patient tolerated treatment well Patient left: in bed;with call bell/phone within reach;with bed alarm set   PT Visit Diagnosis: Muscle weakness (generalized) (M62.81);Difficulty in walking, not elsewhere classified (R26.2);Other abnormalities of gait and mobility (R26.89)     Time: 9147-8295 PT Time Calculation (min) (ACUTE ONLY): 13 min  Charges:  $Therapeutic Activity: 8-22 mins                     [name], PT Acute Rehabilitation Services Office:[phone] 12/17/2022    [name] 12/17/2022, 2:54 PM

## 2022-12-17 NOTE — Progress Notes (Signed)
Progress Note    [name]   RWE:[id number]  DOB: Apr 21, 1966  DOA: 12/07/2022     10 PCP: Marliss Coots, NP  Initial CC: left leg wound  Hospital Course: [name] is a 57 y.o. male with PMH chronic combined CHF, CAD, HLD, polyneuropathy, bipolar d/o, COPD, homelessness. Patient presented with left lower extremity chronic venous stasis wound with concern for superimposed infection.  He had been followed outpatient by podiatry as well prior to hospitalization. He was evaluated by orthopedic surgery and was recommended for undergoing left BKA.  Interval History:  No events overnight.  Seen this morning in his room resting in bed.  Still having ongoing pain but relatively controlled.  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 -Patient counseled on further dietary restrictions and recommendations -Continue oral Lasix    COPD Continued with nebulizers, bronchodilators as needed    Type 2 diabetes mellitus Continue with sliding scale insulin at this time    History of DVT Patient is on Eliquis for anticoagulation.    Hypertension Blood pressure parameters are optimal.    History of alcohol/cocaine/tobacco abuse currently no withdrawal signs at this time. Cessation done at bedside   Old records reviewed in assessment of this patient  Antimicrobials:   DVT prophylaxis:  SCD's Start: 12/10/22 1731   Code Status:   Code Status: Full Code  Mobility Assessment (last 72 hours)     Mobility Assessment     Row  Name 12/17/22 1452 12/17/22 0745 12/17/22 0500 12/16/22 2002 12/16/22 1318   Does patient have an order for bedrest or is patient medically unstable -- No - Continue assessment No - Continue assessment No - Continue assessment --   What is the highest level of mobility based on the progressive mobility assessment? Level 2 (Chairfast) - Balance while sitting on edge of bed and cannot stand Level 2 (Chairfast) - Balance while sitting on edge of bed and cannot stand Level 2 (Chairfast) - Balance while sitting on edge of bed and cannot stand Level 2 (Chairfast) - Balance while sitting on edge of bed and cannot stand Level 2 (Chairfast) - Balance while sitting on edge of bed and cannot stand   Is the above level different from baseline mobility prior to current illness? -- Yes - Recommend PT order Yes - Recommend PT order Yes - Recommend PT order --    Row Name 12/16/22 0730 12/15/22 2017 12/15/22 1301 12/15/22 0711 12/14/22 2026   Does patient have an order for bedrest or is patient medically unstable No - Continue assessment No - Continue assessment -- No - Continue assessment No - Continue assessment   What is the highest level of mobility based on the progressive mobility assessment? Level 2 (Chairfast) - Balance while sitting on edge of bed and cannot stand Level 2 (Chairfast) - Balance while sitting on edge of bed and cannot stand Level 2 (Chairfast) - Balance while sitting on edge of bed and cannot stand Level 2 (Chairfast) - Balance while sitting on edge of bed and cannot stand Level 2 (Chairfast) - Balance while sitting on edge  of bed and cannot stand   Is the above level different from baseline mobility prior to current illness? Yes - Recommend PT order Yes - Recommend PT order -- Yes - Recommend PT order Yes - Recommend PT order            Barriers to discharge: homeless Disposition Plan:  Seeking SNF Status is: Inpatient  Objective: Blood pressure (!) 148/92, pulse 96, temperature 98 F  (36.7 C), temperature source Oral, resp. rate 17, height 5\' 9"  (1.753 m), weight 80 kg, SpO2 100 %.  Examination:  Physical Exam Constitutional:      Appearance: Normal appearance.  HENT:     Head: Normocephalic and atraumatic.     Mouth/Throat:     Mouth: Mucous membranes are moist.  Eyes:     Extraocular Movements: Extraocular movements intact.  Cardiovascular:     Rate and Rhythm: Normal rate and regular rhythm.  Pulmonary:     Effort: Pulmonary effort is normal.     Breath sounds: Normal breath sounds.  Abdominal:     General: Bowel sounds are normal. There is no distension.     Palpations: Abdomen is soft.     Tenderness: There is no abdominal tenderness.  Musculoskeletal:     Cervical back: Normal range of motion and neck supple.     Comments: Left BKA noted with wound VAC in place.  Right AKA also noted.  Foam dressing over right stump noted with some excoriated skin  Skin:    General: Skin is warm and dry.  Neurological:     General: No focal deficit present.     Mental Status: He is alert.  Psychiatric:        Mood and Affect: Mood normal.      Consultants:  Orthopedic surgery  Procedures:  12/10/2022: Left BKA  Data Reviewed: No results found for this or any previous visit (from the past 24 hour(s)).  I have reviewed pertinent nursing notes, vitals, labs, and images as necessary. I have ordered labwork to follow up on as indicated.  I have reviewed the last notes from staff over past 24 hours. I have discussed patient's care plan and test results with nursing staff, CM/SW, and other staff as appropriate.  Time spent: Greater than 50% of the 55 minute visit was spent in counseling/coordination of care for the patient as laid out in the A&P.   LOS: 10 days   Dwyane Dee, MD [facility] 12/17/2022, 4:31 PM

## 2022-12-17 NOTE — Plan of Care (Signed)
  Problem: Clinical Measurements: Goal: Diagnostic test results will improve Outcome: Progressing   Problem: Nutrition: Goal: Adequate nutrition will be maintained Outcome: Progressing   Problem: Pain Managment: Goal: General experience of comfort will improve Outcome: Progressing   Problem: Safety: Goal: Ability to remain free from injury will improve Outcome: Progressing

## 2022-12-18 DIAGNOSIS — L03116 Cellulitis of left lower limb: Secondary | ICD-10-CM | POA: Diagnosis not present

## 2022-12-18 NOTE — Progress Notes (Signed)
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 PMH includes asthma, bioplar 1, depression, schizoaffective disorder, CVA, chronic CHF, homelessness, HTN, DM2, cocaine and cannabis use and R AKA.   OT comments  Pt finishing with PT and agreeable to participate in OT treatment session. Session focused on BUE strength and endurance while completing w/c mobility in hallway on floor unit. Pt demonstrated min-moderate fatigue and was able to self monitor need for rest breaks. Continues to be appropriate for d/c to SNF to focus on increasing self-care skills, functional transfers, and overall activity tolerance and endurance.    Recommendations for follow up therapy are one component of a multi-disciplinary discharge planning process, led by the attending physician.  Recommendations may be updated based on patient status, additional functional criteria and insurance authorization.    Follow Up Recommendations  Skilled nursing-short term rehab (<3 hours/day)     Assistance Recommended at Discharge PRN  Patient can return home with the following  A little help with walking and/or transfers;A little help with bathing/dressing/bathroom;Help with stairs or ramp for entrance;Assist for transportation   Equipment Recommendations  Other (comment) (defer to next venue of care)       Precautions / Restrictions Precautions Precautions: Fall Precaution Comments: R AKA, L BKA Restrictions Weight Bearing Restrictions: Yes LLE Weight Bearing: Non weight bearing       Mobility Bed Mobility Overal bed mobility:  (up in w/c at therapy arrival)    Transfers Overall transfer level:  (remained in w/c at end of session)       Balance Overall balance assessment: Modified Independent Sitting-balance support:  No upper extremity supported Sitting balance-Leahy Scale: Good Sitting balance - Comments: Able to sit unsupported while in w/c         ADL either performed or assessed with clinical judgement      Cognition Arousal/Alertness: Awake/alert Behavior During Therapy: WFL for tasks assessed/performed Overall Cognitive Status: No family/caregiver present to determine baseline cognitive functioning Area of Impairment: Safety/judgement, Awareness, Problem solving, Attention, Memory       Memory: Decreased recall of precautions, Decreased short-term memory   Safety/Judgement: Decreased awareness of deficits, Decreased awareness of safety   Problem Solving: Slow processing, Requires verbal cues, Requires tactile cues          Exercises Other Exercises Other Exercises: Pt participated in w/c mobility while self propelling self in w/c focusing on BUE strength and endurance. Pt able to self monitor fatigue level and take brief rest breaks when needed.       General Comments No oxygen on during session.    Pertinent Vitals/ Pain       Pain Assessment Pain Assessment: Faces Faces Pain Scale: No hurt         Frequency  Min 2X/week        Progress Toward Goals  OT Goals(current goals can now be found in the care plan section)  Progress towards OT goals: Progressing toward goals     Plan Discharge plan remains appropriate;Frequency remains appropriate       AM-PAC OT "6 Clicks" Daily Activity     Outcome Measure   Help from another person eating meals?: None Help from another person taking care of personal grooming?: None Help from another person toileting, which includes using toliet, bedpan, or urinal?: A Little Help  from another person bathing (including washing, rinsing, drying)?: A Little Help from another person to put on and taking off regular upper body clothing?: None Help from another person to put on and taking off regular lower body clothing?: A Little 6  Click Score: 21    End of Session    OT Visit Diagnosis: Muscle weakness (generalized) (M62.81)   Activity Tolerance Patient tolerated treatment well   Patient Left with call bell/phone within reach;Other (comment) (in personal manual wheelchair)           Time: 6606-3016 OT Time Calculation (min): 16 min  Charges: OT General Charges $OT Visit: 1 Visit OT Treatments $Therapeutic Activity: 8-22 mins  Ailene Ravel, OTR/L,CBIS  Supplemental OT - [facility] and [facility] Secure Chat Preferred    Devin Foskey, [name] 12/18/2022, 11:11 AM

## 2022-12-18 NOTE — TOC Progression Note (Signed)
Transition of Care [facility]) - Progression Note    Patient Details  Name: [name] MRN: [id number] Date of Birth: Apr 08, 1966  Transition of Care [facility]) CM/SW Contact  Joanne Chars, [name] Phone Number: 12/18/2022, 3:41 PM  Clinical Narrative:    No bed offers.  CSW LM with [facility] requesting that they review referral.     Expected Discharge Plan: Skilled Nursing Facility Barriers to Discharge: Continued Medical Work up, SNF Pending bed offer  Expected Discharge Plan and Services   Discharge Planning Services: CM Consult Post Acute Care Choice: [place] Living arrangements for the past 2 months: Homeless                                       Social Determinants of Health (SDOH) Interventions [place]: Food Insecurity Present (11/24/2022)  Housing: Medium Risk (12/07/2022)  Transportation Needs: Unmet Transportation Needs (12/07/2022)  Utilities: Not At Risk (12/07/2022)  Alcohol Screen: Medium Risk (06/24/2019)  Depression (PHQ2-9): Medium Risk (02/10/2021)  Tobacco Use: High Risk (12/11/2022)    Readmission Risk Interventions    12/09/2022    9:02 AM 12/01/2022    3:56 PM 08/18/2022    9:00 AM  Readmission Risk Prevention Plan  Transportation Screening Complete Complete Complete  Medication Review Press photographer) Complete Complete Complete  PCP or Specialist appointment within 3-5 days of discharge Complete Complete Complete  HRI or Home Care Consult Complete Complete Complete  SW Recovery Care/Counseling Consult Complete Complete Complete  Palliative Care Screening Not Applicable Not Applicable Not [place] Not Applicable Not Applicable Not Applicable  SNF Comments  n/a

## 2022-12-18 NOTE — Progress Notes (Signed)
Mobility Specialist Progress Note   12/18/22 1135  Mobility  Activity Transferred from chair to bed  Level of Assistance Standby assist, set-up cues, supervision of patient - no hands on  Assistive Device Wheelchair  LLE Weight Bearing NWB  Mobility Referral Yes  $Mobility charge 1 Mobility   Pt adamant on not receiving physical assistance getting back to bed and requesting for MS to support and brace the w/c so pt can AP transfer back to bed. Min directional cues given but stand by assist, no incident on transfer. Left supine in bed w/ call bell in reach and needs met.    Holland Falling Mobility Specialist Please contact via SecureChat or  Rehab office at [phone]'

## 2022-12-18 NOTE — Progress Notes (Signed)
Patient ID: [name], male   DOB: [birth year], 57 y.o.   MRN: [id number] Patient is 8 days status post left below-knee amputation.  There is no drainage in the wound VAC canister.  The wound VAC was removed.  The suture line is intact.  Plan for dry dressing changes as needed.  Patient does have a pressure area on the right above-knee amputation.  Anticipate this should resolve without surgery with patient not ambulating.

## 2022-12-18 NOTE — Progress Notes (Signed)
Patient refused care. Refused his vital signs checked, assessment and night time medicine.

## 2022-12-18 NOTE — Plan of Care (Signed)

## 2022-12-18 NOTE — Progress Notes (Addendum)
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 PMH includes asthma, bioplar 1, depression, schizoaffective disorder, CVA, chronic CHF, homelessness, HTN, DM2, cocaine and cannabis use and R AKA.    PT Comments    Pt attempting to get OOB upon PT arrival to room, unsuccessfully with his L residual limb propped in his w/c. Pt with goals to get into bathroom and use toilet, so PT focusing on repeated transfer training this session. Pt requiring mod +2 for lateral scoot pivot x3 during session, pt requiring multimodal, repeated one-step cuing to complete. PT attempted to introduce sliding board, pt declines use today given need to have a BM and urgency. PT to continue to follow .   Of note, pt with min serosanguinous drainage from L BKA site after toileting, NT made aware.     Recommendations for follow up therapy are one component of a multi-disciplinary discharge planning process, led by the attending physician.  Recommendations may be updated based on patient status, additional functional criteria and insurance authorization.  Follow Up Recommendations  Skilled nursing-short term rehab (<3 hours/day) Can patient physically be transported by private vehicle: No   Assistance Recommended at Discharge Frequent or constant Supervision/Assistance  Patient can return home with the following Assistance with cooking/housework;A little help with bathing/dressing/bathroom   Equipment Recommendations  None recommended by PT    Recommendations for Other Services       Precautions / Restrictions Precautions Precautions: Fall Precaution Comments: R AKA, L BKA Restrictions Weight Bearing Restrictions: Yes LLE Weight Bearing: Non weight bearing     Mobility  Bed Mobility Overal bed mobility: Needs  Assistance             General bed mobility comments: semi-sidelying towards prone upon PT arrival, requiring truncal assist to power up to upright sitting    Transfers Overall transfer level: Needs assistance   Transfers: Bed to chair/wheelchair/BSC            Lateral/Scoot Transfers: Mod assist, +2 physical assistance General transfer comment: x3 lateral scoot pivots from bed>w/c, w/c>toilet, toilet>w/c. Mod +2 for power up, truncal translation, and bodyweight support. Max cues for sequencing transfer and safety especially correct placement of w/c and locked brakes.    Ambulation/Gait               General Gait Details: unable   Theme park manager mobility: Yes Wheelchair propulsion: Both upper extremities Wheelchair parts: Supervision/cueing Distance: 150  Modified Rankin (Stroke Patients Only)       Balance Overall balance assessment: Modified Independent Sitting-balance support: No upper extremity supported Sitting balance-Leahy Scale: Fair                                      Cognition Arousal/Alertness: Awake/alert Behavior During Therapy: WFL for tasks assessed/performed Overall Cognitive Status: No family/caregiver present to determine baseline cognitive functioning Area of Impairment: Safety/judgement, Awareness, Problem solving, Attention, Memory                   Current Attention Level: Sustained Memory: Decreased recall of precautions, Decreased short-term memory   Safety/Judgement: Decreased awareness of deficits, Decreased awareness of safety  Problem Solving: Slow processing, Requires verbal cues, Requires tactile cues, Difficulty sequencing General Comments: pt requiring step-by-step cuing for lateral scoot transfers, when PT providing cuing pt states "that don't make no sense" and requires PT to instruct with different wording multiple times         Exercises      General Comments General comments (skin integrity, edema, etc.): No oxygen on during session.      Pertinent Vitals/Pain Pain Assessment Pain Assessment: Faces Faces Pain Scale: Hurts little more Pain Location: L residual limb Pain Descriptors / Indicators: Sore Pain Intervention(s): Limited activity within patient's tolerance, Monitored during session, Repositioned    Home Living                          Prior Function            PT Goals (current goals can now be found in the care plan section) Acute Rehab PT Goals Patient Stated Goal: decreased pain PT Goal Formulation: With patient Time For Goal Achievement: 12/25/22 Potential to Achieve Goals: Good Progress towards PT goals: Progressing toward goals    Frequency    Min 3X/week      PT Plan Current plan remains appropriate    Co-evaluation              AM-PAC PT "6 Clicks" Mobility   Outcome Measure  Help needed turning from your back to your side while in a flat bed without using bedrails?: None Help needed moving from lying on your back to sitting on the side of a flat bed without using bedrails?: A Little Help needed moving to and from a bed to a chair (including a wheelchair)?: A Lot Help needed standing up from a chair using your arms (e.g., wheelchair or bedside chair)?: Total Help needed to walk in hospital room?: Total Help needed climbing 3-5 steps with a railing? : Total 6 Click Score: 12    End of Session   Activity Tolerance: Patient tolerated treatment well Patient left: with call bell/phone within reach;in chair;Other (comment) (with OT, in w/c) Nurse Communication: Mobility status PT Visit Diagnosis: Muscle weakness (generalized) (M62.81);Difficulty in walking, not elsewhere classified (R26.2);Other abnormalities of gait and mobility (R26.89)     Time: 1610-9604; 5409-8119 PT Time Calculation (min) (ACUTE ONLY): 19 min  Charges:  $Therapeutic Activity:  8-22 mins                    Stacie Glaze, PT DPT Acute Rehabilitation Services Pager [phone]  Office [phone]    Roxine Caddy E Ruffin Pyo 12/18/2022, 2:24 PM

## 2022-12-18 NOTE — Progress Notes (Signed)
Progress Note    [name]   HKV:[id number]  DOB: 1966/11/18  DOA: 12/07/2022     11 PCP: Marliss Coots, NP  Initial CC: left leg wound  Hospital Course: [name] is a 57 y.o. male with PMH chronic combined CHF, CAD, HLD, polyneuropathy, bipolar d/o, COPD, homelessness. Patient presented with left lower extremity chronic venous stasis wound with concern for superimposed infection.  He had been followed outpatient by podiatry as well prior to hospitalization. He was evaluated by orthopedic surgery and was recommended for undergoing left BKA.  Interval History:  No events overnight.  Wound VAC removed this morning by orthopedic surgery. Otherwise feeling okay with no concerns.  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 -Patient counseled on further dietary restrictions and recommendations -Continue oral Lasix    COPD Continued with nebulizers, bronchodilators as needed    Type 2 diabetes mellitus Continue with sliding scale insulin at this time    History of DVT Patient is on Eliquis for anticoagulation.    Hypertension Blood pressure parameters are optimal.    History of alcohol/cocaine/tobacco abuse currently no withdrawal signs at this time. Cessation done at bedside   Old records reviewed in assessment of this patient  Antimicrobials:   DVT prophylaxis:  SCD's Start: 12/10/22 1731   Code Status:   Code Status: Full Code  Mobility Assessment (last 72 hours)     Mobility Assessment     Row Name  12/18/22 1319 12/18/22 1109 12/18/22 0715 12/17/22 2040 12/17/22 1452   Does patient have an order for bedrest or is patient medically unstable -- -- No - Continue assessment No - Continue assessment --   What is the highest level of mobility based on the progressive mobility assessment? Level 2 ([place]) - Balance while sitting on edge of bed and cannot stand Level 2 (Chairfast) - Balance while sitting on edge of bed and cannot stand Level 2 (Chairfast) - Balance while sitting on edge of bed and cannot stand Level 2 (Chairfast) - Balance while sitting on edge of bed and cannot stand Level 2 (Chairfast) - Balance while sitting on edge of bed and cannot stand   Is the above level different from baseline mobility prior to current illness? -- -- Yes - Recommend PT order Yes - Recommend PT order --    Row Name 12/17/22 0745 12/17/22 0500 12/16/22 2002 12/16/22 1318 12/16/22 0730   Does patient have an order for bedrest or is patient medically unstable No - Continue assessment No - Continue assessment No - Continue assessment -- No - Continue assessment   What is the highest level of mobility based on the progressive mobility assessment? Level 2 (Chair[place]) - Balance while sitting on edge of bed and cannot stand Level 2 (Chairfast) - Balance while sitting on edge of bed and cannot stand Level 2 (Chairfast) - Balance while sitting on edge of bed and cannot stand Level 2 (Chairfast) - Balance while sitting on edge of bed and cannot stand Level 2 (Chairfast) - Balance while sitting on edge of bed and cannot stand   Is the above  level different from baseline mobility prior to current illness? Yes - Recommend PT order Yes - Recommend PT order Yes - Recommend PT order -- Yes - Recommend PT order    [place] Name 12/15/22 2017           Does patient have an order for bedrest or is patient medically unstable No - Continue assessment       What is the highest level of mobility based on the progressive mobility  assessment? Level 2 (Chair[place]) - Balance while sitting on edge of bed and cannot stand       Is the above level different from baseline mobility prior to current illness? Yes - Recommend PT order                Barriers to discharge: homeless Disposition Plan:  Seeking SNF Status is: Inpatient  Objective: Blood pressure (!) 131/92, pulse 94, temperature 98.6 F (37 C), temperature source Oral, resp. rate 18, height 5\' 9"  (1.753 m), weight 87.4 kg, SpO2 100 %.  Examination:  Physical Exam Constitutional:      Appearance: Normal appearance.  HENT:     Head: Normocephalic and atraumatic.     Mouth/Throat:     Mouth: Mucous membranes are moist.  Eyes:     Extraocular Movements: Extraocular movements intact.  Cardiovascular:     Rate and Rhythm: Normal rate and regular rhythm.  Pulmonary:     Effort: Pulmonary effort is normal.     Breath sounds: Normal breath sounds.  Abdominal:     General: Bowel sounds are normal. There is no distension.     Palpations: Abdomen is soft.     Tenderness: There is no abdominal tenderness.  Musculoskeletal:     Cervical back: Normal range of motion and neck supple.     Comments: Left BKA noted; staples in place.  Wound VAC removed.  Right AKA still appreciated  Skin:    General: Skin is warm and dry.  Neurological:     General: No focal deficit present.     Mental Status: He is alert.  Psychiatric:        Mood and Affect: Mood normal.      Consultants:  Orthopedic surgery  Procedures:  12/10/2022: Left BKA  Data Reviewed: No results found for this or any previous visit (from the past 24 hour(s)).  I have reviewed pertinent nursing notes, vitals, labs, and images as necessary. I have ordered labwork to follow up on as indicated.  I have reviewed the last notes from staff over past 24 hours. I have discussed patient's care plan and test results with nursing staff, CM/SW, and other staff as appropriate.    LOS: 11 days   Dwyane Dee, MD [facility] 12/18/2022, 2:23 PM

## 2022-12-18 NOTE — Plan of Care (Signed)
  Problem: Clinical Measurements: Goal: Diagnostic test results will improve Outcome: Progressing   Problem: Education: Goal: Knowledge of General Education information will improve Description: Including pain rating scale, medication(s)/side effects and non-pharmacologic comfort measures Outcome: Progressing   Problem: Coping: Goal: Level of anxiety will decrease Outcome: Progressing   Problem: Pain Managment: Goal: General experience of comfort will improve Outcome: Progressing

## 2022-12-19 DIAGNOSIS — I96 Gangrene, not elsewhere classified: Secondary | ICD-10-CM | POA: Diagnosis not present

## 2022-12-19 MED ORDER — POLYETHYLENE GLYCOL 3350 17 G PO PACK
17.0000 g | PACK | Freq: Every day | ORAL | Status: DC
  Administered 2022-12-20 – 2022-12-29 (×3): 17 g via ORAL
  Filled 2022-12-19 (×13): qty 1

## 2022-12-19 MED ORDER — SENNOSIDES-DOCUSATE SODIUM 8.6-50 MG PO TABS
1.0000 | ORAL_TABLET | Freq: Two times a day (BID) | ORAL | Status: DC
  Administered 2022-12-20 – 2023-01-09 (×18): 1 via ORAL
  Filled 2022-12-19 (×45): qty 1

## 2022-12-19 MED ORDER — HYDROMORPHONE HCL 1 MG/ML IJ SOLN
1.0000 mg | INTRAMUSCULAR | Status: DC | PRN
  Administered 2022-12-19 – 2023-01-15 (×70): 1 mg via INTRAVENOUS
  Filled 2022-12-19 (×82): qty 1

## 2022-12-19 MED ORDER — OXYCODONE HCL 5 MG PO TABS
20.0000 mg | ORAL_TABLET | ORAL | Status: DC | PRN
  Administered 2022-12-19 – 2023-06-06 (×463): 20 mg via ORAL
  Filled 2022-12-19 (×481): qty 4

## 2022-12-19 NOTE — TOC Progression Note (Addendum)
Transition of Care [facility]) - Progression Note    Patient Details  Name: [name] MRN: [id number] Date of Birth: 1966-07-27  Transition of Care [facility]) CM/SW Contact  Joanne Chars, [place] Phone Number: 12/19/2022, 8:25 AM  Clinical Narrative: CSW reached out to [facility] and [facility] to see if they can offer beds.    1030: [facility]ac[facility] reached out, needs the referral emailed.  Email sent.    Expected Discharge Plan: [place] Barriers to Discharge: Continued Medical Work up, SNF Pending bed offer  Expected Discharge Plan and Services   Discharge Planning Services: CM Consult Post Acute Care Choice: [place] Living arrangements for the past 2 months: Homeless                                       Social Determinants of Health (SDOH) Interventions [place]: Food Insecurity Present (11/24/2022)  Housing: Medium Risk (12/07/2022)  Transportation Needs: Unmet Transportation Needs (12/07/2022)  Utilities: Not At Risk (12/07/2022)  Alcohol Screen: Medium Risk (06/24/2019)  Depression (PHQ2-9): Medium Risk (02/10/2021)  Tobacco Use: High Risk (12/11/2022)    Readmission Risk Interventions    12/09/2022    9:02 AM 12/01/2022    3:56 PM 08/18/2022    9:00 AM  Readmission Risk Prevention Plan  Transportation Screening Complete Complete Complete  Medication Review Press photographer) Complete Complete Complete  PCP or Specialist appointment within 3-5 days of discharge Complete Complete Complete  HRI or Home Care Consult Complete Complete Complete  SW Recovery Care/Counseling Consult Complete Complete Complete  Palliative Care Screening Not Applicable Not Applicable Not [place] Not Applicable Not Applicable Not Applicable  SNF Comments  n/a

## 2022-12-19 NOTE — Progress Notes (Signed)
Progress Note    [name]   [id number]:[id number]  DOB: 1966/06/23  DOA: 12/07/2022     12 PCP: Marliss Coots, NP  Initial CC: left leg wound  Hospital Course: [name] is a 57 y.o. male with PMH chronic combined CHF, CAD, HLD, polyneuropathy, bipolar d/o, COPD, homelessness. [name] presented with left lower extremity chronic venous stasis wound with concern for superimposed infection.  [name] had been followed outpatient by podiatry as well prior to hospitalization. [name] was evaluated by orthopedic surgery and was recommended for undergoing left BKA.  Interval History:  No events overnight.  Seems to be complaining of worsening pain this morning.  Adjusted his regimen while in room.  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 -[name] counseled on further dietary restrictions and recommendations -Continue oral Lasix    COPD Continued with nebulizers, bronchodilators as needed    Type 2 diabetes mellitus Continue with sliding scale insulin at this time    History of DVT [name] is on Eliquis for anticoagulation.    Hypertension Blood pressure parameters are optimal.    History of alcohol/cocaine/tobacco abuse currently no withdrawal signs at this time. Cessation done at bedside   Old records reviewed in assessment of this [name]  Antimicrobials:   DVT prophylaxis:  SCD's Start: 12/10/22 1731   Code Status:   Code Status: Full Code  Mobility Assessment (last 72 hours)     Mobility Assessment     Row Name  12/18/22 2120 12/18/22 1319 12/18/22 1109 12/18/22 0715 12/17/22 2040   Does [name] have an order for bedrest or is [name] medically unstable No - Continue assessment -- -- No - Continue assessment No - Continue assessment   What is the highest level of mobility based on the progressive mobility assessment? Level 2 (Chairfast) - Balance while sitting on edge of bed and cannot stand Level 2 (Chairfast) - Balance while sitting on edge of bed and cannot stand Level 2 (Chairfast) - Balance while sitting on edge of bed and cannot stand Level 2 (Chairfast) - Balance while sitting on edge of bed and cannot stand Level 2 (Chairfast) - Balance while sitting on edge of bed and cannot stand   Is the above level different from baseline mobility prior to current illness? Yes - Recommend PT order -- -- Yes - Recommend PT order Yes - Recommend PT order    Roberts Name 12/17/22 1452 12/17/22 0745 12/17/22 0500 12/16/22 2002 12/16/22 1318   Does [name] have an order for bedrest or is [name] medically unstable -- No - Continue assessment No - Continue assessment No - Continue assessment --   What is the highest level of mobility based on the progressive mobility assessment? Level 2 (Chairfast) - Balance while sitting on edge of bed and cannot stand Level 2 (Chairfast) - Balance while sitting on edge of bed and cannot stand Level 2 (Chairfast) - Balance while sitting on edge of bed and cannot stand Level 2 (Chairfast) - Balance while sitting on edge of bed and cannot stand Level 2 (Chairfast) - Balance while sitting on edge of bed and cannot  stand   Is the above level different from baseline mobility prior to current illness? -- Yes - Recommend PT order Yes - Recommend PT order Yes - Recommend PT order --            Barriers to discharge: homeless Disposition Plan:  Seeking SNF Status is: Inpatient  Objective: Blood pressure (!) 163/94, pulse 96, temperature 97.6 F (36.4 C), temperature source Oral, resp. rate  18, height 5\' 9"  (1.753 m), weight 73.3 kg, SpO2 100 %.  Examination:  Physical Exam Constitutional:      Appearance: Normal appearance.  HENT:     Head: Normocephalic and atraumatic.     Mouth/Throat:     Mouth: Mucous membranes are moist.  Eyes:     Extraocular Movements: Extraocular movements intact.  Cardiovascular:     Rate and Rhythm: Normal rate and regular rhythm.  Pulmonary:     Effort: Pulmonary effort is normal.     Breath sounds: Normal breath sounds.  Abdominal:     General: Bowel sounds are normal. There is no distension.     Palpations: Abdomen is soft.     Tenderness: There is no abdominal tenderness.  Musculoskeletal:     Cervical back: Normal range of motion and neck supple.     Comments: Left BKA noted; staples in place.  Wound VAC removed.  Right AKA still appreciated  Skin:    General: Skin is warm and dry.  Neurological:     General: No focal deficit present.     Mental Status: [name] is alert.  Psychiatric:        Mood and Affect: Mood normal.      Consultants:  Orthopedic surgery  Procedures:  12/10/2022: Left BKA  Data Reviewed: No results found for this or any previous visit (from the past 24 hour(s)).  I have reviewed pertinent nursing notes, vitals, labs, and images as necessary. I have ordered labwork to follow up on as indicated.  I have reviewed the last notes from staff over past 24 hours. I have discussed [name]'s care plan and test results with nursing staff, CM/SW, and other staff as appropriate.    LOS: 12 days   Dwyane Dee, MD [facility] 12/19/2022, 1:00 PM

## 2022-12-19 NOTE — Progress Notes (Signed)
Patient refused dressing change

## 2022-12-19 NOTE — Plan of Care (Signed)
  Problem: Education: °Goal: Knowledge of General Education information will improve °Description: Including pain rating scale, medication(s)/side effects and non-pharmacologic comfort measures °Outcome: Progressing °  °Problem: Coping: °Goal: Level of anxiety will decrease °Outcome: Progressing °  °Problem: Safety: °Goal: Ability to remain free from injury will improve °Outcome: Progressing °  °Problem: Pain Managment: °Goal: General experience of comfort will improve °Outcome: Progressing °  °

## 2022-12-19 NOTE — Progress Notes (Signed)
Mobility Specialist Progress Note   12/19/22 1431  Mobility  Activity  (Transferred to BR + propelling)  Level of Assistance Moderate assist, patient does 50-74%  Assistive Device Wheelchair  Distance Ambulated (ft)  (Propelled 723ft*)  LLE Weight Bearing NWB  Activity Response Tolerated well  Mobility Referral Yes  $Mobility charge 1 Mobility   Received pt in bed requesting help to BR. Stand by assist from bed > w/c but ModA to laterally scoot from w/c to toilet d/t fatigue. Uncertain on success of BM or void, pt would flush toilet themselves. Once finished, pt able to propelled in hallway independently w/o any assistance and able to make it back to room w/o fault. ModA to laterally scoot pt back to bed d/t earlier expressed fatigue. Left w/ call bell in reach and needs met.    Holland Falling Mobility Specialist Please contact via SecureChat or  Rehab office at [phone]

## 2022-12-20 DIAGNOSIS — I96 Gangrene, not elsewhere classified: Secondary | ICD-10-CM | POA: Diagnosis not present

## 2022-12-20 NOTE — Progress Notes (Signed)
Progress Note    [name]   [id number]:[id number]  DOB: [birth year]  DOA: 12/07/2022     13 PCP: Marliss Coots, NP  Initial CC: left leg wound  Hospital Course: [name] is a 57 y.o. male with PMH chronic combined CHF, CAD, HLD, polyneuropathy, bipolar d/o, COPD, homelessness. Patient presented with left lower extremity chronic venous stasis wound with concern for superimposed infection.  He had been followed outpatient by podiatry as well prior to hospitalization. He was evaluated by orthopedic surgery and was recommended for undergoing left BKA.  Interval History:  No events overnight.  Pain a little better controlled this morning and he appeared more comfortable.  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 -Patient counseled on further dietary restrictions and recommendations -Continue oral Lasix    COPD Continued with nebulizers, bronchodilators as needed    Type 2 diabetes mellitus Continue with sliding scale insulin at this time    History of DVT Patient is on Eliquis for anticoagulation.    Hypertension Blood pressure parameters are optimal.    History of alcohol/cocaine/tobacco abuse currently no withdrawal signs at this time. Cessation done at bedside   Old records reviewed in assessment of this patient  Antimicrobials:   DVT prophylaxis:  SCD's Start: 12/10/22 1731   Code Status:   Code Status: Full Code  Mobility Assessment (last 72 hours)     Mobility Assessment     Row Name 12/19/22 19:55:17  12/19/22 0943 12/18/22 2120 12/18/22 1319 12/18/22 1109   Does patient have an order for bedrest or is patient medically unstable No - Continue assessment No - Continue assessment No - Continue assessment -- --   What is the highest level of mobility based on the progressive mobility assessment? Level 2 (Chairfast) - Balance while sitting on edge of bed and cannot stand Level 2 (Chairfast) - Balance while sitting on edge of bed and cannot stand Level 2 (Chairfast) - Balance while sitting on edge of bed and cannot stand Level 2 (Chairfast) - Balance while sitting on edge of bed and cannot stand Level 2 (Chairfast) - Balance while sitting on edge of bed and cannot stand   Is the above level different from baseline mobility prior to current illness? -- Yes - Recommend PT order Yes - Recommend PT order -- --    Row Name 12/18/22 0715 12/17/22 2040 12/17/22 1452       Does patient have an order for bedrest or is patient medically unstable No - Continue assessment No - Continue assessment --     What is the highest level of mobility based on the progressive mobility assessment? Level 2 (Chairfast) - Balance while sitting on edge of bed and cannot stand Level 2 (Chairfast) - Balance while sitting on edge of bed and cannot stand Level 2 (Chairfast) - Balance while sitting on edge of bed and cannot stand     Is the above level different from baseline mobility prior to current illness? Yes - Recommend PT order Yes - Recommend PT order --  Barriers to discharge: homeless Disposition Plan:  Seeking SNF Status is: Inpatient  Objective: Blood pressure 119/78, pulse 83, temperature 98.7 F (37.1 C), resp. rate 18, height 5\' 9"  (1.753 m), weight 73.3 kg, SpO2 100 %.  Examination:  Physical Exam Constitutional:      Appearance: Normal appearance.  HENT:     Head: Normocephalic and atraumatic.     Mouth/Throat:     Mouth: Mucous membranes are moist.  Eyes:     Extraocular Movements:  Extraocular movements intact.  Cardiovascular:     Rate and Rhythm: Normal rate and regular rhythm.  Pulmonary:     Effort: Pulmonary effort is normal.     Breath sounds: Normal breath sounds.  Abdominal:     General: Bowel sounds are normal. There is no distension.     Palpations: Abdomen is soft.     Tenderness: There is no abdominal tenderness.  Musculoskeletal:     Cervical back: Normal range of motion and neck supple.     Comments: Left BKA noted; staples in place.  Wound VAC removed.  Right AKA still appreciated  Skin:    General: Skin is warm and dry.  Neurological:     General: No focal deficit present.     Mental Status: He is alert.  Psychiatric:        Mood and Affect: Mood normal.      Consultants:  Orthopedic surgery  Procedures:  12/10/2022: Left BKA  Data Reviewed: No results found for this or any previous visit (from the past 24 hour(s)).  I have reviewed pertinent nursing notes, vitals, labs, and images as necessary. I have ordered labwork to follow up on as indicated.  I have reviewed the last notes from staff over past 24 hours. I have discussed patient's care plan and test results with nursing staff, CM/SW, and other staff as appropriate.    LOS: 13 days   Dwyane Dee, MD [facility] 12/20/2022, 2:39 PM

## 2022-12-21 DIAGNOSIS — I96 Gangrene, not elsewhere classified: Secondary | ICD-10-CM | POA: Diagnosis not present

## 2022-12-21 NOTE — Progress Notes (Signed)
Progress Note    [name]   GMW:[id number]  DOB: 1966/02/29  DOA: 12/07/2022     14 PCP: Marliss Coots, NP  Initial CC: left leg wound  Hospital Course: [name] is a 57 y.o. male with PMH chronic combined CHF, CAD, HLD, polyneuropathy, bipolar d/o, COPD, homelessness. Patient presented with left lower extremity chronic venous stasis wound with concern for superimposed infection.  He had been followed outpatient by podiatry as well prior to hospitalization. He was evaluated by orthopedic surgery and was recommended for undergoing left BKA.  Interval History:  No events overnight.  Pain a little better controlled this morning and he appeared more comfortable.  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 -Patient counseled on dietary restrictions and recommendations -Continue oral Lasix    COPD Continued with nebulizers, bronchodilators as needed    Type 2 diabetes mellitus Continue with sliding scale insulin at this time    History of DVT Patient is on Eliquis for anticoagulation.    Hypertension Blood pressure parameters are optimal.    History of alcohol/cocaine/tobacco abuse currently no withdrawal signs at this time. Cessation done at bedside   Old records reviewed in assessment of this patient  Antimicrobials:   DVT prophylaxis:  SCD's Start: 12/10/22 1731   Code Status:   Code Status: Full Code  Mobility Assessment (last 72 hours)     Mobility Assessment     Row Name 12/19/22 19:55:17 12/19/22  0943 12/18/22 2120 12/18/22 1319     Does patient have an order for bedrest or is patient medically unstable No - Continue assessment No - Continue assessment No - Continue assessment --    What is the highest level of mobility based on the progressive mobility assessment? Level 2 (Chairfast) - Balance while sitting on edge of bed and cannot stand Level 2 (Chairfast) - Balance while sitting on edge of bed and cannot stand Level 2 (Chairfast) - Balance while sitting on edge of bed and cannot stand Level 2 (Chairfast) - Balance while sitting on edge of bed and cannot stand    Is the above level different from baseline mobility prior to current illness? -- Yes - Recommend PT order Yes - Recommend PT order --             Barriers to discharge: homeless Disposition Plan:  Seeking SNF Status is: Inpatient  Objective: Blood pressure (!) 137/90, pulse 82, temperature 97.7 F (36.5 C), temperature source Oral, resp. rate 18, height 5\' 9"  (1.753 m), weight 73.3 kg, SpO2 96 %.  Examination:  Physical Exam Constitutional:      Appearance: Normal appearance.  HENT:     Head: Normocephalic and atraumatic.     Mouth/Throat:     Mouth: Mucous membranes are moist.  Eyes:     Extraocular Movements: Extraocular movements intact.  Cardiovascular:     Rate and Rhythm: Normal rate and regular rhythm.  Pulmonary:     Effort: Pulmonary effort is normal.     Breath sounds: Normal breath sounds.  Abdominal:     General: Bowel sounds are normal. There is  no distension.     Palpations: Abdomen is soft.     Tenderness: There is no abdominal tenderness.  Musculoskeletal:     Cervical back: Normal range of motion and neck supple.     Comments: Left BKA noted; staples in place.  Wound VAC removed.  Right AKA still appreciated  Skin:    General: Skin is warm and dry.  Neurological:     General: No focal deficit present.     Mental Status: He is alert.  Psychiatric:        Mood and Affect: Mood normal.       Consultants:  Orthopedic surgery  Procedures:  12/10/2022: Left BKA  Data Reviewed: No results found for this or any previous visit (from the past 24 hour(s)).  I have reviewed pertinent nursing notes, vitals, labs, and images as necessary. I have ordered labwork to follow up on as indicated.  I have reviewed the last notes from staff over past 24 hours. I have discussed patient's care plan and test results with nursing staff, CM/SW, and other staff as appropriate.    LOS: 14 days   Dwyane Dee, MD [facility] 12/21/2022, 11:44 AM

## 2022-12-21 NOTE — Progress Notes (Signed)
Mobility Specialist Progress Note   12/21/22 1100  Mobility  Activity Transferred from chair to bed  Level of Assistance Minimal assist, patient does 75% or more  Assistive Device Wheelchair (+ HHA)  LLE Weight Bearing NWB  Activity Response Tolerated well  Mobility Referral Yes  $Mobility charge 1 Mobility   Pt requesting to get back to bed d/t increased pain in LLE. MinA to hold w/c while pt AP transferred back to bed. No faults throughout, call bell in reach.  Holland Falling Mobility Specialist Please contact via SecureChat or  Rehab office at [phone]

## 2022-12-21 NOTE — Progress Notes (Signed)
Mobility Specialist Progress Note   12/21/22 1029  Mobility  Activity  (Propelled in w/c)  Level of Assistance Minimal assist, patient does 75% or more  Assistive Device Wheelchair  Distance Ambulated (ft)  (Propelled 1169ft)  Range of Motion/Exercises Right arm;Left arm  LLE Weight Bearing NWB  Activity Response Tolerated well  Mobility Referral Yes  $Mobility charge 1 Mobility   Received pt at EOB having c/o pain in LLE(9/10) but agreeable. AP transfer to w/c w/ minA to hold w/c steady.  Propelled in hallway w/o fault, returned back to room w/ NT present in room.   Holland Falling Mobility Specialist Please contact via SecureChat or  Rehab office at [phone]

## 2022-12-22 DIAGNOSIS — I96 Gangrene, not elsewhere classified: Secondary | ICD-10-CM | POA: Diagnosis not present

## 2022-12-22 LAB — CBC WITH DIFFERENTIAL/PLATELET
Abs Immature Granulocytes: 0.02 10*3/uL (ref 0.00–0.07)
Basophils Absolute: 0 10*3/uL (ref 0.0–0.1)
Basophils Relative: 1 %
Eosinophils Absolute: 0.2 10*3/uL (ref 0.0–0.5)
Eosinophils Relative: 3 %
HCT: 38.8 % — ABNORMAL LOW (ref 39.0–52.0)
Hemoglobin: 12.4 g/dL — ABNORMAL LOW (ref 13.0–17.0)
Immature Granulocytes: 0 %
Lymphocytes Relative: 38 %
Lymphs Abs: 2.6 10*3/uL (ref 0.7–4.0)
MCH: 29.8 pg (ref 26.0–34.0)
MCHC: 32 g/dL (ref 30.0–36.0)
MCV: 93.3 fL (ref 80.0–100.0)
Monocytes Absolute: 0.9 10*3/uL (ref 0.1–1.0)
Monocytes Relative: 13 %
Neutro Abs: 3 10*3/uL (ref 1.7–7.7)
Neutrophils Relative %: 45 %
Platelets: 446 10*3/uL — ABNORMAL HIGH (ref 150–400)
RBC: 4.16 MIL/uL — ABNORMAL LOW (ref 4.22–5.81)
RDW: 14.3 % (ref 11.5–15.5)
WBC: 6.7 10*3/uL (ref 4.0–10.5)
nRBC: 0 % (ref 0.0–0.2)

## 2022-12-22 LAB — BASIC METABOLIC PANEL
Anion gap: 12 (ref 5–15)
BUN: 17 mg/dL (ref 6–20)
CO2: 23 mmol/L (ref 22–32)
Calcium: 9 mg/dL (ref 8.9–10.3)
Chloride: 102 mmol/L (ref 98–111)
Creatinine, Ser: 0.84 mg/dL (ref 0.61–1.24)
GFR, Estimated: >60 mL/min (ref >60)
Glucose, Bld: 100 mg/dL — ABNORMAL HIGH (ref 70–99)
Potassium: 4 mmol/L (ref 3.5–5.1)
Sodium: 137 mmol/L (ref 135–145)

## 2022-12-22 LAB — GLUCOSE, CAPILLARY
Glucose-Capillary: 115 mg/dL — ABNORMAL HIGH (ref 70–99)
Glucose-Capillary: 121 mg/dL — ABNORMAL HIGH (ref 70–99)

## 2022-12-22 LAB — MAGNESIUM: Magnesium: 1.9 mg/dL (ref 1.7–2.4)

## 2022-12-22 NOTE — Progress Notes (Signed)
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 PMH includes asthma, bioplar 1, depression, schizoaffective disorder, CVA, chronic CHF, homelessness, HTN, DM2, cocaine and cannabis use, and R AKA.    PT Comments    Pt progressing towards physical therapy goals. Pt leading a majority of session as he was unwilling to try a most of what PT was suggesting. However, motivated for hallway mobility in the wheelchair to "get a break from the same 4 walls" of his room.  Pt able to self-propel himself throughout the entire unit, ~510'. Noted residual limb hanging in knee flexion as pt declining to wear the limb protector. Feel an elevating leg rest would be helpful to give pt a place to prop the residual limb when he is in the wheelchair. Pt initially agreeable and when PT got the leg rest, pt refused to try it. Decreased frequency to 1x/week as is on mobility specialist caseload as well. Will continue to follow and progress as able per POC.    Recommendations for follow up therapy are one component of a multi-disciplinary discharge planning process, led by the attending physician.  Recommendations may be updated based on patient status, additional functional criteria and insurance authorization.  Follow Up Recommendations  Skilled nursing-short term rehab (<3 hours/day) Can patient physically be transported by private vehicle: No   Assistance Recommended at Discharge Frequent or constant Supervision/Assistance  Patient can return home with the following Assistance with cooking/housework;A little help with bathing/dressing/bathroom;A little help with walking and/or transfers;Assist for transportation;Help with stairs or ramp for entrance   Equipment Recommendations  Other (comment) (Recommending L elevating leg  rest for wheelchair but pt refusing it)    Recommendations for Other Services       Precautions / Restrictions Precautions Precautions: Fall Precaution Comments: R AKA, L BKA Restrictions Weight Bearing Restrictions: No LLE Weight Bearing: Non weight bearing     Mobility  Bed Mobility Overal bed mobility: Modified Independent Bed Mobility: Rolling, Supine to Sit Rolling: Modified independent (Device/Increase time)   Supine to sit: Modified independent (Device/Increase time) Sit to supine: Modified independent (Device/Increase time)   General bed mobility comments: Pt was able to roll and transition to/from supine and sidelying without assist. Although pt not performing as therapy typically instructs, pt not willing to try a more efficient way, does not accept physical assistance, and was able to safely complete with increased time/effort.    Transfers Overall transfer level: Needs assistance Equipment used: None, Sliding board Transfers: Bed to chair/wheelchair/BSC            Lateral/Scoot Transfers: Min assist, Min guard General transfer comment: Pt prefers to laterally scoot from w/c to bed instead of post-ant transfer. Agreed to try slide board, however blames slide board for uncontrolled sit>supine during slide attempts. Pt was able to get hips onto bed and therapist assisted trunk down with LOB.    Ambulation/Gait               General Gait Details: unable   Theme park manager mobility: Yes Wheelchair propulsion: Both upper extremities Wheelchair parts: Independent (arm rests; wheelchair does not have leg rests) Distance: 510  Modified Rankin (Stroke Patients Only)       Balance Overall  balance assessment: Modified Independent Sitting-balance support: No upper extremity supported Sitting balance-Leahy Scale: Fair Sitting balance - Comments: Able to sit unsupported while in w/c                                     Cognition Arousal/Alertness: Awake/alert Behavior During Therapy: WFL for tasks assessed/performed Overall Cognitive Status: No family/caregiver present to determine baseline cognitive functioning Area of Impairment: Safety/judgement, Awareness, Problem solving, Attention, Memory                   Current Attention Level: Sustained Memory: Decreased recall of precautions, Decreased short-term memory   Safety/Judgement: Decreased awareness of deficits, Decreased awareness of safety Awareness: Emergent Problem Solving: Slow processing, Requires verbal cues, Requires tactile cues, Difficulty sequencing          Exercises Other Exercises Other Exercises: Pt refused exercises, unwilling to even stretch residual limb out to assess extension in knee.    General Comments        Pertinent Vitals/Pain Pain Assessment Pain Assessment: Faces Faces Pain Scale: Hurts a little bit Pain Location: L residual limb. Noncommittal about pain. Initially states he has pain and when asked to elaborate on intensity of pain he states it doesn't hurt right now. Pain Descriptors / Indicators: Operative site guarding Pain Intervention(s): Limited activity within patient's tolerance, Monitored during session, Repositioned    Home Living                          Prior Function            PT Goals (current goals can now be found in the care plan section) Acute Rehab PT Goals Patient Stated Goal: decreased pain PT Goal Formulation: With patient Time For Goal Achievement: 12/25/22 Potential to Achieve Goals: Good Progress towards PT goals: Progressing toward goals    Frequency    Min 1X/week      PT Plan Frequency needs to be updated    Co-evaluation              AM-PAC PT "6 Clicks" Mobility   Outcome Measure  Help needed turning from your back to your side while in a flat bed without using bedrails?: None Help needed  moving from lying on your back to sitting on the side of a flat bed without using bedrails?: None Help needed moving to and from a bed to a chair (including a wheelchair)?: A Little Help needed standing up from a chair using your arms (e.g., wheelchair or bedside chair)?: Total Help needed to walk in hospital room?: Total Help needed climbing 3-5 steps with a railing? : Total 6 Click Score: 14    End of Session   Activity Tolerance: Patient tolerated treatment well Patient left: with call bell/phone within reach;in bed (EOB) Nurse Communication: Mobility status PT Visit Diagnosis: Muscle weakness (generalized) (M62.81);Difficulty in walking, not elsewhere classified (R26.2);Other abnormalities of gait and mobility (R26.89)     Time: 5643-3295 PT Time Calculation (min) (ACUTE ONLY): 22 min  Charges:  $Therapeutic Activity: 8-22 mins                     Rolinda Roan, PT, DPT Acute Rehabilitation Services Secure Chat Preferred Office: [phone]    [name] 12/22/2022, 1:21 PM

## 2022-12-22 NOTE — Progress Notes (Signed)
Patient removed bandage to left stump due to it "itching" him. Patient stated he only scratched around the wound.Staples in place .Yellow drainage noted. Small dehiscence to site noted. RN educated on proper dressing techniques, Hand washing,and allowing staff to assist. Despite teaching patient stated he will do what he wants to do. Dressing reapplied.

## 2022-12-22 NOTE — TOC Progression Note (Signed)
Transition of Care [facility]) - Progression Note    Patient Details  Name: [name] MRN: [id number] Date of Birth: [birth year]  Transition of Care [facility]) CM/SW Contact  Joanne Chars, [name] Phone Number: 12/22/2022, 3:58 PM  Clinical Narrative:   Messages left with [place] and [place] requesting update on SNF referral.     Expected Discharge Plan: [place] Barriers to Discharge: Continued Medical Work up, SNF Pending bed offer  Expected Discharge Plan and Services   Discharge Planning Services: CM Consult Post Acute Care Choice: [place] Living arrangements for the past 2 months: Homeless                                       Social Determinants of Health (SDOH) Interventions [place]: Food Insecurity Present (11/24/2022)  Housing: Medium Risk (12/07/2022)  Transportation Needs: Unmet Transportation Needs (12/07/2022)  Utilities: Not At Risk (12/07/2022)  Alcohol Screen: Medium Risk (06/24/2019)  Depression (PHQ2-9): Medium Risk (02/10/2021)  Tobacco Use: High Risk (12/11/2022)    Readmission Risk Interventions    12/09/2022    9:02 AM 12/01/2022    3:56 PM 08/18/2022    9:00 AM  Readmission Risk Prevention Plan  Transportation Screening Complete Complete Complete  Medication Review Press photographer) Complete Complete Complete  PCP or Specialist appointment within 3-5 days of discharge Complete Complete Complete  HRI or Home Care Consult Complete Complete Complete  SW Recovery Care/Counseling Consult Complete Complete Complete  Palliative Care Screening Not Applicable Not Applicable Not [place] Not Applicable Not Applicable Not Applicable  SNF Comments  n/a

## 2022-12-22 NOTE — Progress Notes (Signed)
Progress Note    [name]   IPJ:[id number]  DOB: 12-Apr-1966  DOA: 12/07/2022     15 PCP: [name] Coots, NP  Initial CC: left leg wound  Hospital Course: [name] is a 57 y.o. male with PMH chronic combined CHF, CAD, HLD, polyneuropathy, bipolar d/o, COPD, homelessness. Patient presented with left lower extremity chronic venous stasis wound with concern for superimposed infection.  He had been followed outpatient by podiatry as well prior to hospitalization. He was evaluated by orthopedic surgery and was recommended for undergoing left BKA.  Interval History:  No events overnight.  Pain a little better controlled this morning and he appeared more comfortable. Rolling around in [facility] this morning.   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 -Patient counseled on dietary restrictions and recommendations -Continue oral Lasix    COPD Continued with nebulizers, bronchodilators as needed    Type 2 diabetes mellitus Continue with sliding scale insulin at this time    History of DVT Patient is on Eliquis for anticoagulation.    Hypertension Blood pressure parameters are optimal.    History of alcohol/cocaine/tobacco abuse currently no withdrawal signs at this time. Cessation done at bedside   Old records reviewed in assessment of this patient  Antimicrobials:   DVT prophylaxis:  SCD's Start: 12/10/22 1731   Code Status:   Code Status: Full Code  Mobility Assessment (last 72 hours)     Mobility Assessment      Row Name 12/22/22 1305 12/22/22 1301 12/22/22 0500 12/22/22 0000 12/21/22 2157   Does patient have an order for bedrest or is patient medically unstable No - Continue assessment -- No - Continue assessment No - Continue assessment No - Continue assessment   What is the highest level of mobility based on the progressive mobility assessment? Level 2 (Chairfast) - Balance while sitting on edge of bed and cannot stand Level 2 (Chairfast) - Balance while sitting on edge of bed and cannot stand Level 2 (Chairfast) - Balance while sitting on edge of bed and cannot stand Level 2 (Chairfast) - Balance while sitting on edge of bed and cannot stand Level 2 (Chairfast) - Balance while sitting on edge of bed and cannot stand   Is the above level different from baseline mobility prior to current illness? Yes - Recommend PT order -- Yes - Recommend PT order Yes - Recommend PT order Yes - Recommend PT order    [place] Name 12/21/22 2000 12/19/22 19:55:17         Does patient have an order for bedrest or is patient medically unstable No - Continue assessment No - Continue assessment      What is the highest level of mobility based on the progressive mobility assessment? Level 2 (Chairfast) - Balance while sitting on edge of bed and cannot stand Level 2 (Chairfast) - Balance while sitting on edge of bed and cannot stand      Is the above level different from baseline mobility prior to current illness? Yes - Recommend PT order --  Barriers to discharge: homeless Disposition Plan:  Seeking SNF Status is: Inpatient  Objective: Blood pressure (!) 137/97, pulse 81, temperature 98.7 F (37.1 C), temperature source Oral, resp. rate 20, height 5\' 9"  (1.753 m), weight 73.3 kg, SpO2 100 %.  Examination:  Physical Exam Constitutional:      Appearance: Normal appearance.  HENT:     Head: Normocephalic and atraumatic.     Mouth/Throat:     Mouth: Mucous membranes are moist.  Eyes:     Extraocular  Movements: Extraocular movements intact.  Cardiovascular:     Rate and Rhythm: Normal rate and regular rhythm.  Pulmonary:     Effort: Pulmonary effort is normal.     Breath sounds: Normal breath sounds.  Abdominal:     General: Bowel sounds are normal. There is no distension.     Palpations: Abdomen is soft.     Tenderness: There is no abdominal tenderness.  Musculoskeletal:     Cervical back: Normal range of motion and neck supple.     Comments: Left BKA noted; staples in place.  Wound VAC removed.  Right AKA still appreciated  Skin:    General: Skin is warm and dry.  Neurological:     General: No focal deficit present.     Mental Status: He is alert.  Psychiatric:        Mood and Affect: Mood normal.      Consultants:  Orthopedic surgery  Procedures:  12/10/2022: Left BKA  Data Reviewed: Results for orders placed or performed during the hospital encounter of 12/07/22 (from the past 24 hour(s))  Basic metabolic panel     Status: Abnormal   Collection Time: 12/22/22  3:11 AM  Result Value Ref Range   Sodium 137 135 - 145 mmol/L   Potassium 4.0 3.5 - 5.1 mmol/L   Chloride 102 98 - 111 mmol/L   CO2 23 22 - 32 mmol/L   Glucose, Bld 100 (H) 70 - 99 mg/dL   BUN 17 6 - 20 mg/dL   Creatinine, Ser 12/24/22 0.61 - 1.24 mg/dL   Calcium 9.0 8.9 - 6.30 mg/dL   GFR, Estimated 16.0 >10 mL/min   Anion gap 12 5 - 15  CBC with Differential/Platelet     Status: Abnormal   Collection Time: 12/22/22  3:11 AM  Result Value Ref Range   WBC 6.7 4.0 - 10.5 K/uL   RBC 4.16 (L) 4.22 - 5.81 MIL/uL   Hemoglobin 12.4 (L) 13.0 - 17.0 g/dL   HCT 12/24/22 (L) 23.5 - 57.3 %   MCV 93.3 80.0 - 100.0 fL   MCH 29.8 26.0 - 34.0 pg   MCHC 32.0 30.0 - 36.0 g/dL   RDW 22.0 25.4 - 27.0 %   Platelets 446 (H) 150 - 400 K/uL   nRBC 0.0 0.0 - 0.2 %   Neutrophils Relative % 45 %   Neutro Abs 3.0 1.7 - 7.7 K/uL   Lymphocytes Relative 38 %   Lymphs Abs 2.6 0.7 - 4.0 K/uL   Monocytes Relative 13 %   Monocytes  Absolute 0.9 0.1 - 1.0 K/uL   Eosinophils Relative 3 %   Eosinophils Absolute 0.2 0.0 - 0.5 K/uL   Basophils Relative 1 %   Basophils Absolute 0.0 0.0 - 0.1 K/uL   Immature Granulocytes 0 %   Abs Immature Granulocytes 0.02 0.00 - 0.07 K/uL  Magnesium     Status: None   Collection Time: 12/22/22  3:11 AM  Result Value Ref Range  Magnesium 1.9 1.7 - 2.4 mg/dL  Glucose, capillary     Status: Abnormal   Collection Time: 12/22/22 10:07 AM  Result Value Ref Range   Glucose-Capillary 115 (H) 70 - 99 mg/dL    I have reviewed pertinent nursing notes, vitals, labs, and images as necessary. I have ordered labwork to follow up on as indicated.  I have reviewed the last notes from staff over past 24 hours. I have discussed patient's care plan and test results with nursing staff, CM/SW, and other staff as appropriate.    LOS: 15 days   Dwyane Dee, MD [facility] 12/22/2022, 2:36 PM

## 2022-12-23 DIAGNOSIS — I96 Gangrene, not elsewhere classified: Secondary | ICD-10-CM | POA: Diagnosis not present

## 2022-12-23 LAB — GLUCOSE, CAPILLARY
Glucose-Capillary: 109 mg/dL — ABNORMAL HIGH (ref 70–99)
Glucose-Capillary: 107 mg/dL — ABNORMAL HIGH (ref 70–99)

## 2022-12-23 NOTE — TOC Progression Note (Addendum)
Transition of Care [facility]) - Progression Note    Patient Details  Name: [name] MRN: [id number] Date of Birth: 1966-08-15  Transition of Care [facility]) CM/SW Contact  Joanne Chars, [name] Phone Number: 12/23/2022, 10:12 AM  Clinical Narrative:   [facility] "bed locked" and has no availability currently.    1110: CSW spoke with [employer].  For them to make bed offer, pt would need to agree to stay for LTC as well, minimum of 60 days.  CSW spoke with pt regarding potential LTC at [facility] and pt would be willing to commit to staying a minimum of 60 days and potentially longer.  Expected Discharge Plan: [place] Barriers to Discharge: Continued Medical Work up, SNF Pending bed offer  Expected Discharge Plan and Services   Discharge Planning Services: CM Consult Post Acute Care Choice: [place] Living arrangements for the past 2 months: Homeless                                       Social Determinants of Health (SDOH) Interventions [place]: Food Insecurity Present (11/24/2022)  Housing: Medium Risk (12/07/2022)  Transportation Needs: Unmet Transportation Needs (12/07/2022)  Utilities: Not At Risk (12/07/2022)  Alcohol Screen: Medium Risk (06/24/2019)  Depression (PHQ2-9): Medium Risk (02/10/2021)  Tobacco Use: High Risk (12/11/2022)    Readmission Risk Interventions    12/09/2022    9:02 AM 12/01/2022    3:56 PM 08/18/2022    9:00 AM  Readmission Risk Prevention Plan  Transportation Screening Complete Complete Complete  Medication Review Press photographer) Complete Complete Complete  PCP or Specialist appointment within 3-5 days of discharge Complete Complete Complete  HRI or Home Care Consult Complete Complete Complete  SW Recovery Care/Counseling Consult Complete Complete Complete  Palliative Care Screening Not Applicable Not Applicable Not [place] Not  Applicable Not Applicable Not Applicable  SNF Comments  n/a

## 2022-12-23 NOTE — Progress Notes (Signed)
Mobility Specialist Progress Note   12/23/22 1028  Mobility  Activity  (Propelled in w/c)  Level of Assistance Standby assist, set-up cues, supervision of patient - no hands on  Assistive Device Wheelchair  Distance Ambulated (ft)  (Prolled 1100)  LLE Weight Bearing NWB  Activity Response Tolerated well  Mobility Referral Yes  $Mobility charge 1 Mobility   Pt found in doorway eager to propel in hallway. Propelled in hallway w/ standing obstacle in route, pt able to maneuver safely around the and return back to room w/o fault. Stand by assist to hold w/c as pt laterally scoots to chair. Call bell in reach.   [place] Mobility Specialist Please contact via SecureChat or  Rehab office at [phone]

## 2022-12-23 NOTE — Progress Notes (Signed)
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 PMH includes asthma, bioplar 1, depression, schizoaffective disorder, CVA, chronic CHF, homelessness, HTN, DM2, cocaine and cannabis use, and R AKA.   OT comments  Pt sleeping upon therapy arrival. Once awake, pt agreeable to participate in OT treatment session. Session focused on functional transfers using lateral scoot technique to transfer from surface to surface such as bed<>w/c. Pt required increased time to process education during session prior to transfer attempt. VC and visual demonstration provided for proper form and technique. Pt required min guard to transfer from bed to w/c using lateral scoot technique. Was unable to perform same transfer technique to return to bed and opted for AP transfer. Completed AP transfer with mod-max difficulty. Pt selected to complete AP transfer in his own way and was not open to trying suggested transfer technique provided by OT.    Recommendations for follow up therapy are one component of a multi-disciplinary discharge planning process, led by the attending physician.  Recommendations may be updated based on patient status, additional functional criteria and insurance authorization.    Follow Up Recommendations  Skilled nursing-short term rehab (<3 hours/day)     Assistance Recommended at Discharge PRN  Patient can return home with the following  A little help with walking and/or transfers;A little help with bathing/dressing/bathroom;Help with stairs or ramp for entrance;Assist for transportation   Equipment Recommendations  Other (comment) (defer to next venue of care)       Precautions / Restrictions Precautions Precautions: Fall Precaution Comments: R AKA, L BKA Restrictions Weight Bearing  Restrictions: Yes LLE Weight Bearing: Non weight bearing       Mobility Bed Mobility Overal bed mobility: Needs Assistance Bed Mobility: Rolling, Supine to Sit, Sit to Supine Rolling: Modified independent (Device/Increase time)   Supine to sit: Modified independent (Device/Increase time) Sit to supine: Modified independent (Device/Increase time)   General bed mobility comments: Pt does not perform bed mobility in preferred technique for therapy although he does complete bed mobility without physical assist.    Transfers Overall transfer level: Needs assistance Equipment used: None Transfers: Bed to chair/wheelchair/BSC         Anterior-Posterior transfers: Min guard (OT stabilized w/c)  Lateral/Scoot Transfers: Min guard, From elevated surface General transfer comment: With increased time and multiple attempts, pt was able to successfuly perform a lateral scoot transfer from bed to w/c towards left side. Attempted to complete same transfer technique when returning to bed towards right side although unable to complete transfer more than 50%. Pt complete AP transfer to return to bed. Unable to extend left knee out during transfer and it remained bent with knee pushed up against bed. Attempted to provided education on concerns with LE positioning during transfer although pt reports that he was in pain and that was the only way he was going to do it.     Balance Overall balance assessment: Mild deficits observed, not formally tested Sitting-balance support: No upper extremity supported Sitting balance-Leahy Scale: Fair Sitting balance - Comments: pt verbalized several times that he was fearful of falling during transfer practice although never needed any physical assist to maintain sitting balance.           ADL either performed or assessed with clinical judgement      Cognition Arousal/Alertness: Awake/alert  Behavior During Therapy: WFL for tasks assessed/performed,  Anxious Overall Cognitive Status: No family/caregiver present to determine baseline cognitive functioning Area of Impairment: Safety/judgement, Awareness, Problem solving, Attention, Memory                     Memory: Decreased recall of precautions, Decreased short-term memory Following Commands: Follows one step commands consistently Safety/Judgement: Decreased awareness of deficits, Decreased awareness of safety   Problem Solving: Slow processing, Requires verbal cues, Requires tactile cues, Difficulty sequencing General Comments: Pt provided with verbal cues and visual demonstration of technique prior to attempting lateral scoot transfer from bed to w/c.                   Pertinent Vitals/ Pain       Pain Assessment Pain Assessment: Faces Faces Pain Scale: Hurts even more Pain Location: Left residual limb after attempting lateral scoot transfer from w/c to bed towards right side. Pain Descriptors / Indicators: Operative site guarding, Grimacing, Guarding Pain Intervention(s): Limited activity within patient's tolerance, Monitored during session, Patient requesting pain meds-RN notified, Repositioned         Frequency  Min 2X/week        Progress Toward Goals  OT Goals(current goals can now be found in the care plan section)  Progress towards OT goals: Progressing toward goals     Plan Discharge plan remains appropriate;Frequency remains appropriate       AM-PAC OT "6 Clicks" Daily Activity     Outcome Measure   Help from another person eating meals?: None Help from another person taking care of personal grooming?: None Help from another person toileting, which includes using toliet, bedpan, or urinal?: A Little Help from another person bathing (including washing, rinsing, drying)?: A Little Help from another person to put on and taking off regular upper body clothing?: None Help from another person to put on and taking off regular lower body clothing?:  A Little 6 Click Score: 21    End of Session    OT Visit Diagnosis: Muscle weakness (generalized) (M62.81)   Activity Tolerance Patient tolerated treatment well   Patient Left in bed;with call bell/phone within reach;with nursing/sitter in room   Nurse Communication Patient requests pain meds        Time: 4270-6237 OT Time Calculation (min): 30 min  Charges: OT General Charges $OT Visit: 1 Visit OT Treatments $Therapeutic Activity: 23-37 mins  Ailene Ravel, OTR/L,CBIS  Supplemental OT - [facility] and [employer] Secure Chat Preferred    Kell Ferris, [name] 12/23/2022, 4:21 PM

## 2022-12-23 NOTE — Progress Notes (Signed)
Progress Note    [name]   RJJ:[id number]  DOB: 1966-07-12  DOA: 12/07/2022     16 PCP: Marliss Coots, NP  Initial CC: left leg wound  Hospital Course: [name] is a 57 y.o. male with PMH chronic combined CHF, CAD, HLD, polyneuropathy, bipolar d/o, COPD, homelessness. Patient presented with left lower extremity chronic venous stasis wound with concern for superimposed infection.  He had been followed outpatient by podiatry as well prior to hospitalization. He was evaluated by orthopedic surgery and was recommended for undergoing left BKA.  Interval History:  No events overnight.  Pain seems controlled.  Went for wheelchair ride around the halls this morning.  Still awaiting placement.  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 -Patient counseled on dietary restrictions and recommendations -Continue oral Lasix    COPD Continued with nebulizers, bronchodilators as needed    Type 2 diabetes mellitus Continue with sliding scale insulin at this time    History of DVT Patient is on Eliquis for anticoagulation.    Hypertension Blood pressure parameters are optimal.    History of alcohol/cocaine/tobacco abuse currently no withdrawal signs at this time. Cessation done at bedside   Old records reviewed in assessment of this patient  Antimicrobials:   DVT prophylaxis:  SCD's Start: 12/10/22 1731   Code Status:   Code Status: Full Code  Mobility Assessment (last 72 hours)     Mobility Assessment     Row  Name 12/23/22 0951 12/22/22 2130 12/22/22 1305 12/22/22 1301 12/22/22 0500   Does patient have an order for bedrest or is patient medically unstable No - Continue assessment No - Continue assessment No - Continue assessment -- No - Continue assessment   What is the highest level of mobility based on the progressive mobility assessment? Level 2 (Chairfast) - Balance while sitting on edge of bed and cannot stand Level 2 (Chairfast) - Balance while sitting on edge of bed and cannot stand Level 2 (Chairfast) - Balance while sitting on edge of bed and cannot stand Level 2 (Chairfast) - Balance while sitting on edge of bed and cannot stand Level 2 (Chairfast) - Balance while sitting on edge of bed and cannot stand   Is the above level different from baseline mobility prior to current illness? Yes - Recommend PT order Yes - Recommend PT order Yes - Recommend PT order -- Yes - Recommend PT order    [place] Name 12/22/22 0000 12/21/22 2157 12/21/22 2000       Does patient have an order for bedrest or is patient medically unstable No - Continue assessment No - Continue assessment No - Continue assessment     What is the highest level of mobility based on the progressive mobility assessment? Level 2 (Chairfast) - Balance while sitting on edge of bed and cannot stand Level 2 (Chairfast) - Balance while sitting on edge of bed and cannot stand Level 2 (Chairfast) - Balance while sitting on edge of bed and cannot stand     Is the above level different from baseline mobility prior to current illness? Yes - Recommend PT  order Yes - Recommend PT order Yes - Recommend PT order              Barriers to discharge: homeless Disposition Plan:  Seeking SNF Status is: Inpatient  Objective: Blood pressure (!) 126/104, pulse 91, temperature 98 F (36.7 C), temperature source Oral, resp. rate 18, height 5\' 9"  (1.753 m), weight 81.3 kg, SpO2 100 %.  Examination:  Physical Exam Constitutional:      Appearance: Normal  appearance.  HENT:     Head: Normocephalic and atraumatic.     Mouth/Throat:     Mouth: Mucous membranes are moist.  Eyes:     Extraocular Movements: Extraocular movements intact.  Cardiovascular:     Rate and Rhythm: Normal rate and regular rhythm.  Pulmonary:     Effort: Pulmonary effort is normal.     Breath sounds: Normal breath sounds.  Abdominal:     General: Bowel sounds are normal. There is no distension.     Palpations: Abdomen is soft.     Tenderness: There is no abdominal tenderness.  Musculoskeletal:     Cervical back: Normal range of motion and neck supple.     Comments: Left BKA noted; staples in place.  Wound VAC removed.  Right AKA still appreciated  Skin:    General: Skin is warm and dry.  Neurological:     General: No focal deficit present.     Mental Status: He is alert.  Psychiatric:        Mood and Affect: Mood normal.      Consultants:  Orthopedic surgery  Procedures:  12/10/2022: Left BKA  Data Reviewed: Results for orders placed or performed during the hospital encounter of 12/07/22 (from the past 24 hour(s))  Glucose, capillary     Status: Abnormal   Collection Time: 12/22/22  4:15 PM  Result Value Ref Range   Glucose-Capillary 121 (H) 70 - 99 mg/dL  Glucose, capillary     Status: Abnormal   Collection Time: 12/23/22  9:47 AM  Result Value Ref Range   Glucose-Capillary 109 (H) 70 - 99 mg/dL  Glucose, capillary     Status: Abnormal   Collection Time: 12/23/22 11:38 AM  Result Value Ref Range   Glucose-Capillary 107 (H) 70 - 99 mg/dL    I have reviewed pertinent nursing notes, vitals, labs, and images as necessary. I have ordered labwork to follow up on as indicated.  I have reviewed the last notes from staff over past 24 hours. I have discussed patient's care plan and test results with nursing staff, CM/SW, and other staff as appropriate.    LOS: 16 days   Dwyane Dee, MD [facility] 12/23/2022, 11:43 AM

## 2022-12-24 DIAGNOSIS — J449 Chronic obstructive pulmonary disease, unspecified: Secondary | ICD-10-CM | POA: Diagnosis not present

## 2022-12-24 DIAGNOSIS — I5040 Unspecified combined systolic (congestive) and diastolic (congestive) heart failure: Secondary | ICD-10-CM | POA: Diagnosis not present

## 2022-12-24 DIAGNOSIS — I10 Essential (primary) hypertension: Secondary | ICD-10-CM | POA: Diagnosis not present

## 2022-12-24 DIAGNOSIS — I96 Gangrene, not elsewhere classified: Secondary | ICD-10-CM | POA: Diagnosis not present

## 2022-12-24 LAB — GLUCOSE, CAPILLARY
Glucose-Capillary: 123 mg/dL — ABNORMAL HIGH (ref 70–99)
Glucose-Capillary: 112 mg/dL — ABNORMAL HIGH (ref 70–99)
Glucose-Capillary: 113 mg/dL — ABNORMAL HIGH (ref 70–99)

## 2022-12-24 MED ORDER — DOXYCYCLINE HYCLATE 100 MG PO TABS
100.0000 mg | ORAL_TABLET | Freq: Two times a day (BID) | ORAL | Status: DC
  Administered 2022-12-24 – 2022-12-26 (×5): 100 mg via ORAL
  Filled 2022-12-24 (×5): qty 1

## 2022-12-24 NOTE — Progress Notes (Signed)
Patient ID: [name], male   DOB: [birth year], 57 y.o.   MRN: [id number] Examination of the left below-knee amputation shows a small area of clear serosanguineous drainage over the anterior compartment.  The surgical incision shows no ischemic changes no cellulitis.  The posterior flap is healthy and viable.  Will start patient on doxycycline and would recommend discharge on doxycycline for 2 weeks.

## 2022-12-24 NOTE — Plan of Care (Signed)
Problem: Fluid Volume: Goal: Hemodynamic stability will improve Outcome: Not Progressing   Problem: Clinical Measurements: Goal: Diagnostic test results will improve Outcome: Not Progressing Goal: Signs and symptoms of infection will decrease Outcome: Not Progressing   Problem: Respiratory: Goal: Ability to maintain adequate ventilation will improve Outcome: Not Progressing   Problem: Education: Goal: Knowledge of General Education information will improve Description: Including pain rating scale, medication(s)/side effects and non-pharmacologic comfort measures Outcome: Not Progressing   Problem: Health Behavior/Discharge Planning: Goal: Ability to manage health-related needs will improve Outcome: Not Progressing   Problem: Clinical Measurements: Goal: Ability to maintain clinical measurements within normal limits will improve Outcome: Not Progressing Goal: Will remain free from infection Outcome: Not Progressing Goal: Diagnostic test results will improve Outcome: Not Progressing Goal: Respiratory complications will improve Outcome: Not Progressing Goal: Cardiovascular complication will be avoided Outcome: Not Progressing   Problem: Activity: Goal: Risk for activity intolerance will decrease Outcome: Not Progressing   Problem: Nutrition: Goal: Adequate nutrition will be maintained Outcome: Not Progressing   Problem: Coping: Goal: Level of anxiety will decrease Outcome: Not Progressing   Problem: Elimination: Goal: Will not experience complications related to bowel motility Outcome: Not Progressing Goal: Will not experience complications related to urinary retention Outcome: Not Progressing   Problem: Pain Managment: Goal: General experience of comfort will improve Outcome: Not Progressing   Problem: Safety: Goal: Ability to remain free from injury will improve Outcome: Not Progressing   Problem: Skin Integrity: Goal: Risk for impaired skin integrity  will decrease Outcome: Not Progressing   Problem: Education: Goal: Knowledge of the prescribed therapeutic regimen will improve Outcome: Not Progressing Goal: Ability to verbalize activity precautions or restrictions will improve Outcome: Not Progressing Goal: Understanding of discharge needs will improve Outcome: Not Progressing   Problem: Activity: Goal: Ability to perform//tolerate increased activity and mobilize with assistive devices will improve Outcome: Not Progressing   Problem: Clinical Measurements: Goal: Postoperative complications will be avoided or minimized Outcome: Not Progressing   Problem: Self-Care: Goal: Ability to meet self-care needs will improve Outcome: Not Progressing   Problem: Self-Concept: Goal: Ability to maintain and perform role responsibilities to the fullest extent possible will improve Outcome: Not Progressing   Problem: Pain Management: Goal: Pain level will decrease with appropriate interventions Outcome: Not Progressing   Problem: Skin Integrity: Goal: Demonstration of wound healing without infection will improve Outcome: Not Progressing   Problem: Education: Goal: Knowledge of General Education information will improve Description: Including pain rating scale, medication(s)/side effects and non-pharmacologic comfort measures Outcome: Not Progressing   Problem: Health Behavior/Discharge Planning: Goal: Ability to manage health-related needs will improve Outcome: Not Progressing   Problem: Clinical Measurements: Goal: Ability to maintain clinical measurements within normal limits will improve Outcome: Not Progressing Goal: Will remain free from infection Outcome: Not Progressing Goal: Diagnostic test results will improve Outcome: Not Progressing Goal: Respiratory complications will improve Outcome: Not Progressing Goal: Cardiovascular complication will be avoided Outcome: Not Progressing   Problem: Activity: Goal: Risk for  activity intolerance will decrease Outcome: Not Progressing   Problem: Nutrition: Goal: Adequate nutrition will be maintained Outcome: Not Progressing   Problem: Coping: Goal: Level of anxiety will decrease Outcome: Not Progressing   Problem: Elimination: Goal: Will not experience complications related to bowel motility Outcome: Not Progressing Goal: Will not experience complications related to urinary retention Outcome: Not Progressing   Problem: Pain Managment: Goal: General experience of comfort will improve Outcome: Not Progressing   Problem: Safety: Goal: Ability to remain free from  injury will improve Outcome: Not Progressing   Problem: Skin Integrity: Goal: Risk for impaired skin integrity will decrease Outcome: Not Progressing   

## 2022-12-24 NOTE — Plan of Care (Signed)
  Problem: Fluid Volume: Goal: Hemodynamic stability will improve Outcome: Progressing   Problem: Clinical Measurements: Goal: Diagnostic test results will improve Outcome: Progressing Goal: Signs and symptoms of infection will decrease Outcome: Progressing   Problem: Respiratory: Goal: Ability to maintain adequate ventilation will improve Outcome: Progressing   Problem: Education: Goal: Knowledge of General Education information will improve Description: Including pain rating scale, medication(s)/side effects and non-pharmacologic comfort measures Outcome: Progressing   Problem: Health Behavior/Discharge Planning: Goal: Ability to manage health-related needs will improve Outcome: Progressing   Problem: Clinical Measurements: Goal: Ability to maintain clinical measurements within normal limits will improve Outcome: Progressing Goal: Will remain free from infection Outcome: Progressing Goal: Diagnostic test results will improve Outcome: Progressing

## 2022-12-24 NOTE — Progress Notes (Signed)
Dressing change on Lt stump.

## 2022-12-24 NOTE — Progress Notes (Signed)
[facility]                                                                               [name], is a 57 y.o. male, DOB - Oct 12, 1966, [id number] Admit date - 12/07/2022    Outpatient Primary MD for the patient is [name], [name], NP  LOS - 17  days    Brief summary   [name] is a 57 y.o. male with PMH chronic combined CHF, CAD, HLD, polyneuropathy, bipolar d/o, COPD, homelessness. Patient presented with left lower extremity chronic venous stasis wound with concern for superimposed infection.  He had been followed outpatient by podiatry as well prior to hospitalization. He was evaluated by orthopedic surgery and was recommended for undergoing left BKA.   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  Appreciate Dr Jess Barters recommendations.  Continue with doxycycline to complete the course of 2 weeks.  Pain control and therapy evals.     Hypertension:  BP slightly elevated. On prn labetolol.  Continue with imdur , amlodipine and lasix.     Chronic combined CHF.  He appears compensated. He denies any sob.  Continue with lasix 40 mg daily.    COPD;  No wheezing heard.      Estimated body mass index is 26.47 kg/m as calculated from the following:   Height as of this encounter: 5\' 9"  (1.753 m).   Weight as of this encounter: 81.3 kg.  Code Status: full code.  DVT Prophylaxis:  SCD's Start: 12/10/22 1731   Level of Care: Level of care: Telemetry Surgical Family Communication: none at bedside.   Disposition Plan:     Remains inpatient appropriate:  homeless   Procedures:  None.   Consultants:   Orthopedics.   Antimicrobials:   Anti-infectives (From admission, onward)    Start     Dose/Rate Route Frequency Ordered Stop   12/24/22 1000  doxycycline (VIBRA-TABS) tablet 100 mg        100 mg Oral Every 12 hours  12/24/22 0715     12/10/22 1830  ceFAZolin (ANCEF) IVPB 2g/100 mL premix  Status:  Discontinued        2 g 200 mL/hr over 30 Minutes Intravenous Every 8 hours 12/10/22 1731 12/10/22 1740   12/10/22 1330  ceFAZolin (ANCEF) IVPB 2g/100 mL premix        2 g 200 mL/hr over 30 Minutes Intravenous On call to O.R. 12/10/22 0959 12/10/22 1534   12/08/22 0530  vancomycin (VANCOREADY) IVPB 1500 mg/300 mL        1,500 mg 150 mL/hr over 120 Minutes Intravenous Every 12 hours 12/07/22 1741 12/11/22 0955   12/08/22 0345  metroNIDAZOLE (FLAGYL) IVPB 500 mg        500 mg 100 mL/hr over 60 Minutes Intravenous Every 12 hours 12/07/22 1742 12/12/22 0344   12/07/22 2330  ceFEPIme (MAXIPIME) 2 g in sodium chloride 0.9 % 100 mL IVPB        2 g 200 mL/hr over 30 Minutes Intravenous Every 8 hours 12/07/22 1741  12/11/22 1529   12/07/22 2200  metroNIDAZOLE (FLAGYL) IVPB 500 mg  Status:  Discontinued        500 mg 100 mL/hr over 60 Minutes Intravenous Every 12 hours 12/07/22 2048 12/07/22 2056   12/07/22 1530  ceFEPIme (MAXIPIME) 2 g in sodium chloride 0.9 % 100 mL IVPB        2 g 200 mL/hr over 30 Minutes Intravenous  Once 12/07/22 1516 12/07/22 1734   12/07/22 1530  vancomycin (VANCOREADY) IVPB 1500 mg/300 mL        1,500 mg 150 mL/hr over 120 Minutes Intravenous  Once 12/07/22 1516 12/09/22 0333   12/07/22 1515  metroNIDAZOLE (FLAGYL) IVPB 500 mg        500 mg 100 mL/hr over 60 Minutes Intravenous  Once 12/07/22 1506 12/07/22 1734   12/07/22 1500  ceFAZolin (ANCEF) IVPB 1 g/50 mL premix  Status:  Discontinued        1 g 100 mL/hr over 30 Minutes Intravenous  Once 12/07/22 1452 12/07/22 1513        Medications  Scheduled Meds:  amLODipine  10 mg Oral Daily   vitamin C  1,000 mg Oral Daily   collagenase   Topical Daily   docusate sodium  100 mg Oral Daily   doxycycline  100 mg Oral G29B   folic acid  1 mg Oral Daily   furosemide  40 mg Oral Daily   gabapentin  900 mg Oral TID   hydrocerin    Topical Daily   isosorbide mononitrate  15 mg Oral Daily   multivitamin with minerals  1 tablet Oral Daily   nutrition supplement (JUVEN)  1 packet Oral BID BM   pantoprazole  40 mg Oral Daily   polyethylene glycol  17 g Oral Daily   senna-docusate  1 tablet Oral BID   thiamine  100 mg Oral Daily   Continuous Infusions:  magnesium sulfate bolus IVPB     PRN Meds:.albuterol, alum & mag hydroxide-simeth, bisacodyl, guaiFENesin-dextromethorphan, HYDROmorphone (DILAUDID) injection, labetalol, magnesium citrate, magnesium sulfate bolus IVPB, morphine injection, ondansetron, oxyCODONE, phenol    Subjective:   [name] was seen and examined today.  No new complaints.   Objective:   Vitals:   12/23/22 0950 12/23/22 1621 12/23/22 1951 12/24/22 0731  BP: (!) 126/104 122/73 (!) 161/107 (!) 135/105  Pulse: 91 83 90 87  Resp:   20   Temp:  (!) 97.5 F (36.4 C) 98 F (36.7 C) 97.6 F (36.4 C)  TempSrc:  Oral Oral Oral  SpO2: 100% 100%  98%  Weight:      Height:        Intake/Output Summary (Last 24 hours) at 12/24/2022 1358 Last data filed at 12/24/2022 0900 Gross per 24 hour  Intake 480 ml  Output 1250 ml  Net -770 ml   Filed Weights   12/18/22 0424 12/19/22 0500 12/23/22 0453  Weight: 87.4 kg 73.3 kg 81.3 kg     Exam General: Alert and oriented x 3, NAD Cardiovascular: S1 S2 auscultated, no murmurs, RRR Respiratory: Clear to auscultation bilaterally, no wheezing, Gastrointestinal: Soft, nontender, nondistended, + bowel sounds Ext: left BKA.  Neuro: AAOx3, Skin: No rashes Psych: Normal affect and demeanor, alert and oriented x3    Data Reviewed:  I have personally reviewed following labs and imaging studies   CBC Lab Results  Component Value Date   WBC 6.7 12/22/2022   RBC 4.16 (L) 12/22/2022   HGB 12.4 (L) 12/22/2022  HCT 38.8 (L) 12/22/2022   MCV 93.3 12/22/2022   MCH 29.8 12/22/2022   PLT 446 (H) 12/22/2022   MCHC 32.0 12/22/2022   RDW 14.3  12/22/2022   LYMPHSABS 2.6 12/22/2022   MONOABS 0.9 12/22/2022   EOSABS 0.2 12/22/2022   BASOSABS 0.0 12/22/2022     Last metabolic panel Lab Results  Component Value Date   NA 137 12/22/2022   K 4.0 12/22/2022   CL 102 12/22/2022   CO2 23 12/22/2022   BUN 17 12/22/2022   CREATININE 0.84 12/22/2022   GLUCOSE 100 (H) 12/22/2022   GFRNONAA >60 12/22/2022   GFRAA >60 08/21/2020   CALCIUM 9.0 12/22/2022   PHOS 4.7 (H) 07/18/2022   PROT 7.0 12/16/2022   ALBUMIN 3.0 (L) 12/16/2022   BILITOT 0.5 12/16/2022   ALKPHOS 57 12/16/2022   AST 12 (L) 12/16/2022   ALT 12 12/16/2022   ANIONGAP 12 12/22/2022    CBG (last 3)  Recent Labs    12/23/22 1138 12/24/22 0732 12/24/22 1145  GLUCAP 107* 123* 112*      Coagulation Profile: No results for input(s): "INR", "PROTIME" in the last 168 hours.   Radiology Studies: No results found.     Kathlen Mody M.D. [facility] 12/24/2022, 1:58 PM  Available via Epic secure chat 7am-7pm After 7 pm, please refer to night coverage provider listed on amion.

## 2022-12-24 NOTE — TOC Progression Note (Addendum)
Transition of Care [facility]) - Progression Note    Patient Details  Name: [name] MRN: [id number] Date of Birth: 09-Apr-1966  [facility] CM/SW Contact  Joanne Chars, [name] Phone Number: 12/24/2022, 1:51 PM  Clinical Narrative:   [facility] cannot offer bed.   CSW LM with [facility], [facility].    1600: Email sent to [facility] with pt details at her request.  She will see if any of her facilities are SNF options.     Expected Discharge Plan: [place] Barriers to Discharge: Continued Medical Work up, SNF Pending bed offer  Expected Discharge Plan and Services   Discharge Planning Services: CM Consult Post Acute Care Choice: [place] Living arrangements for the past 2 months: Homeless                                       Social Determinants of Health (SDOH) Interventions [place]: Food Insecurity Present (11/24/2022)  Housing: Medium Risk (12/07/2022)  Transportation Needs: Unmet Transportation Needs (12/07/2022)  Utilities: Not At Risk (12/07/2022)  Alcohol Screen: Medium Risk (06/24/2019)  Depression (PHQ2-9): Medium Risk (02/10/2021)  Tobacco Use: High Risk (12/11/2022)    Readmission Risk Interventions    12/09/2022    9:02 AM 12/01/2022    3:56 PM 08/18/2022    9:00 AM  Readmission Risk Prevention Plan  Transportation Screening Complete Complete Complete  Medication Review Press photographer) Complete Complete Complete  PCP or Specialist appointment within 3-5 days of discharge Complete Complete Complete  HRI or Home Care Consult Complete Complete Complete  SW Recovery Care/Counseling Consult Complete Complete Complete  Palliative Care Screening Not Applicable Not Applicable Not [place] Not Applicable Not Applicable Not Applicable  SNF Comments  n/a

## 2022-12-25 DIAGNOSIS — J449 Chronic obstructive pulmonary disease, unspecified: Secondary | ICD-10-CM | POA: Diagnosis not present

## 2022-12-25 DIAGNOSIS — I5040 Unspecified combined systolic (congestive) and diastolic (congestive) heart failure: Secondary | ICD-10-CM | POA: Diagnosis not present

## 2022-12-25 DIAGNOSIS — I10 Essential (primary) hypertension: Secondary | ICD-10-CM | POA: Diagnosis not present

## 2022-12-25 DIAGNOSIS — I96 Gangrene, not elsewhere classified: Secondary | ICD-10-CM | POA: Diagnosis not present

## 2022-12-25 LAB — GLUCOSE, CAPILLARY: Glucose-Capillary: 148 mg/dL — ABNORMAL HIGH (ref 70–99)

## 2022-12-25 NOTE — Plan of Care (Signed)
Problem: Fluid Volume: Goal: Hemodynamic stability will improve Outcome: Not Progressing   Problem: Clinical Measurements: Goal: Diagnostic test results will improve Outcome: Not Progressing Goal: Signs and symptoms of infection will decrease Outcome: Not Progressing   Problem: Respiratory: Goal: Ability to maintain adequate ventilation will improve Outcome: Not Progressing   Problem: Education: Goal: Knowledge of General Education information will improve Description: Including pain rating scale, medication(s)/side effects and non-pharmacologic comfort measures Outcome: Not Progressing   Problem: Health Behavior/Discharge Planning: Goal: Ability to manage health-related needs will improve Outcome: Not Progressing   Problem: Clinical Measurements: Goal: Ability to maintain clinical measurements within normal limits will improve Outcome: Not Progressing Goal: Will remain free from infection Outcome: Not Progressing Goal: Diagnostic test results will improve Outcome: Not Progressing Goal: Respiratory complications will improve Outcome: Not Progressing Goal: Cardiovascular complication will be avoided Outcome: Not Progressing   Problem: Activity: Goal: Risk for activity intolerance will decrease Outcome: Not Progressing   Problem: Nutrition: Goal: Adequate nutrition will be maintained Outcome: Not Progressing   Problem: Coping: Goal: Level of anxiety will decrease Outcome: Not Progressing   Problem: Elimination: Goal: Will not experience complications related to bowel motility Outcome: Not Progressing Goal: Will not experience complications related to urinary retention Outcome: Not Progressing   Problem: Pain Managment: Goal: General experience of comfort will improve Outcome: Not Progressing   Problem: Safety: Goal: Ability to remain free from injury will improve Outcome: Not Progressing   Problem: Skin Integrity: Goal: Risk for impaired skin integrity  will decrease Outcome: Not Progressing   Problem: Education: Goal: Knowledge of the prescribed therapeutic regimen will improve Outcome: Not Progressing Goal: Ability to verbalize activity precautions or restrictions will improve Outcome: Not Progressing Goal: Understanding of discharge needs will improve Outcome: Not Progressing   Problem: Activity: Goal: Ability to perform//tolerate increased activity and mobilize with assistive devices will improve Outcome: Not Progressing   Problem: Clinical Measurements: Goal: Postoperative complications will be avoided or minimized Outcome: Not Progressing   Problem: Self-Care: Goal: Ability to meet self-care needs will improve Outcome: Not Progressing   Problem: Self-Concept: Goal: Ability to maintain and perform role responsibilities to the fullest extent possible will improve Outcome: Not Progressing   Problem: Pain Management: Goal: Pain level will decrease with appropriate interventions Outcome: Not Progressing   Problem: Skin Integrity: Goal: Demonstration of wound healing without infection will improve Outcome: Not Progressing   Problem: Education: Goal: Knowledge of General Education information will improve Description: Including pain rating scale, medication(s)/side effects and non-pharmacologic comfort measures Outcome: Not Progressing   Problem: Health Behavior/Discharge Planning: Goal: Ability to manage health-related needs will improve Outcome: Not Progressing   Problem: Clinical Measurements: Goal: Ability to maintain clinical measurements within normal limits will improve Outcome: Not Progressing Goal: Will remain free from infection Outcome: Not Progressing Goal: Diagnostic test results will improve Outcome: Not Progressing Goal: Respiratory complications will improve Outcome: Not Progressing Goal: Cardiovascular complication will be avoided Outcome: Not Progressing   Problem: Activity: Goal: Risk for  activity intolerance will decrease Outcome: Not Progressing   Problem: Nutrition: Goal: Adequate nutrition will be maintained Outcome: Not Progressing   Problem: Coping: Goal: Level of anxiety will decrease Outcome: Not Progressing   Problem: Elimination: Goal: Will not experience complications related to bowel motility Outcome: Not Progressing Goal: Will not experience complications related to urinary retention Outcome: Not Progressing   Problem: Pain Managment: Goal: General experience of comfort will improve Outcome: Not Progressing   Problem: Safety: Goal: Ability to remain free from  injury will improve Outcome: Not Progressing   Problem: Skin Integrity: Goal: Risk for impaired skin integrity will decrease Outcome: Not Progressing

## 2022-12-25 NOTE — Progress Notes (Signed)
[facility]                                                                               [name], is a 57 y.o. male, DOB - 1966/10/10, [id number] Admit date - 12/07/2022    Outpatient Primary MD for the patient is [name], Chales Abrahams, NP  LOS - 18  days    Brief summary   [name] is a 57 y.o. male with PMH chronic combined CHF, CAD, HLD, polyneuropathy, bipolar d/o, COPD, homelessness. Patient presented with left lower extremity chronic venous stasis wound with concern for superimposed infection.  He had been followed outpatient by podiatry as well prior to hospitalization. He was evaluated by orthopedic surgery and was recommended for undergoing left BKA.   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  Appreciate Dr Audrie Lia recommendations.  Continue with doxycycline to complete the course of 2 weeks.  Pain control and therapy evals.     Hypertension:  BP slightly elevated. On prn labetolol.  Continue with imdur , amlodipine and lasix.     Chronic combined CHF.  He appears compensated. He denies any sob.  Continue with lasix 40 mg daily.    COPD;  No wheezing heard.      Estimated body mass index is 26.47 kg/m as calculated from the following:   Height as of this encounter: 5\' 9"  (1.753 m).   Weight as of this encounter: 81.3 kg.  Code Status: full code.  DVT Prophylaxis:  SCD's Start: 12/10/22 1731   Level of Care: Level of care: Telemetry Surgical Family Communication: none at bedside.   Disposition Plan:     Remains inpatient appropriate:  homeless   Procedures:  None.   Consultants:   Orthopedics.   Antimicrobials:   Anti-infectives (From admission, onward)    Start     Dose/Rate Route Frequency Ordered Stop   12/24/22 1000  doxycycline (VIBRA-TABS) tablet 100 mg        100 mg Oral Every 12 hours  12/24/22 0715     12/10/22 1830  ceFAZolin (ANCEF) IVPB 2g/100 mL premix  Status:  Discontinued        2 g 200 mL/hr over 30 Minutes Intravenous Every 8 hours 12/10/22 1731 12/10/22 1740   12/10/22 1330  ceFAZolin (ANCEF) IVPB 2g/100 mL premix        2 g 200 mL/hr over 30 Minutes Intravenous On call to O.R. 12/10/22 0959 12/10/22 1534   12/08/22 0530  vancomycin (VANCOREADY) IVPB 1500 mg/300 mL        1,500 mg 150 mL/hr over 120 Minutes Intravenous Every 12 hours 12/07/22 1741 12/11/22 0955   12/08/22 0345  metroNIDAZOLE (FLAGYL) IVPB 500 mg        500 mg 100 mL/hr over 60 Minutes Intravenous Every 12 hours 12/07/22 1742 12/12/22 0344   12/07/22 2330  ceFEPIme (MAXIPIME) 2 g in sodium chloride 0.9 % 100 mL IVPB        2 g 200 mL/hr over 30 Minutes Intravenous Every 8 hours 12/07/22 1741  12/11/22 1529   12/07/22 2200  metroNIDAZOLE (FLAGYL) IVPB 500 mg  Status:  Discontinued        500 mg 100 mL/hr over 60 Minutes Intravenous Every 12 hours 12/07/22 2048 12/07/22 2056   12/07/22 1530  ceFEPIme (MAXIPIME) 2 g in sodium chloride 0.9 % 100 mL IVPB        2 g 200 mL/hr over 30 Minutes Intravenous  Once 12/07/22 1516 12/07/22 1734   12/07/22 1530  vancomycin (VANCOREADY) IVPB 1500 mg/300 mL        1,500 mg 150 mL/hr over 120 Minutes Intravenous  Once 12/07/22 1516 12/09/22 0333   12/07/22 1515  metroNIDAZOLE (FLAGYL) IVPB 500 mg        500 mg 100 mL/hr over 60 Minutes Intravenous  Once 12/07/22 1506 12/07/22 1734   12/07/22 1500  ceFAZolin (ANCEF) IVPB 1 g/50 mL premix  Status:  Discontinued        1 g 100 mL/hr over 30 Minutes Intravenous  Once 12/07/22 1452 12/07/22 1513        Medications  Scheduled Meds:  amLODipine  10 mg Oral Daily   vitamin C  1,000 mg Oral Daily   collagenase   Topical Daily   docusate sodium  100 mg Oral Daily   doxycycline  100 mg Oral D14H   folic acid  1 mg Oral Daily   furosemide  40 mg Oral Daily   gabapentin  900 mg Oral TID   hydrocerin    Topical Daily   isosorbide mononitrate  15 mg Oral Daily   multivitamin with minerals  1 tablet Oral Daily   nutrition supplement (JUVEN)  1 packet Oral BID BM   pantoprazole  40 mg Oral Daily   polyethylene glycol  17 g Oral Daily   senna-docusate  1 tablet Oral BID   thiamine  100 mg Oral Daily   Continuous Infusions:  magnesium sulfate bolus IVPB     PRN Meds:.albuterol, alum & mag hydroxide-simeth, bisacodyl, guaiFENesin-dextromethorphan, HYDROmorphone (DILAUDID) injection, labetalol, magnesium citrate, magnesium sulfate bolus IVPB, morphine injection, ondansetron, oxyCODONE, phenol    Subjective:   [name] was seen and examined today.  No new complaints.   Objective:   Vitals:   12/24/22 0731 12/24/22 1508 12/24/22 2014 12/25/22 0812  BP: (!) 135/105 (!) 143/92 (!) 145/104 (!) 148/98  Pulse: 87 88    Resp:   20   Temp: 97.6 F (36.4 C) 98.6 F (37 C) 98.5 F (36.9 C)   TempSrc: Oral Oral Oral   SpO2: 98% 100% 100%   Weight:      Height:        Intake/Output Summary (Last 24 hours) at 12/25/2022 1958 Last data filed at 12/25/2022 1300 Gross per 24 hour  Intake 960 ml  Output 1250 ml  Net -290 ml    Filed Weights   12/18/22 0424 12/19/22 0500 12/23/22 0453  Weight: 87.4 kg 73.3 kg 81.3 kg     Exam General: Alert and oriented x 3, NAD Cardiovascular: S1 S2 auscultated, no murmurs, RRR Respiratory: Clear to auscultation bilaterally, no wheezing, Gastrointestinal: Soft, nontender, nondistended, + bowel sounds Ext: left BKA.  Neuro: AAOx3, Skin: No rashes Psych: Normal affect and demeanor, alert and oriented x3    Data Reviewed:  I have personally reviewed following labs and imaging studies   CBC Lab Results  Component Value Date   WBC 6.7 12/22/2022   RBC 4.16 (L) 12/22/2022   HGB 12.4 (L) 12/22/2022  HCT 38.8 (L) 12/22/2022   MCV 93.3 12/22/2022   MCH 29.8 12/22/2022   PLT 446 (H) 12/22/2022   MCHC 32.0 12/22/2022   RDW 14.3  12/22/2022   LYMPHSABS 2.6 12/22/2022   MONOABS 0.9 12/22/2022   EOSABS 0.2 12/22/2022   BASOSABS 0.0 16/09/9603     Last metabolic panel Lab Results  Component Value Date   NA 137 12/22/2022   K 4.0 12/22/2022   CL 102 12/22/2022   CO2 23 12/22/2022   BUN 17 12/22/2022   CREATININE 0.84 12/22/2022   GLUCOSE 100 (H) 12/22/2022   GFRNONAA >60 12/22/2022   GFRAA >60 08/21/2020   CALCIUM 9.0 12/22/2022   PHOS 4.7 (H) 07/18/2022   PROT 7.0 12/16/2022   ALBUMIN 3.0 (L) 12/16/2022   BILITOT 0.5 12/16/2022   ALKPHOS 57 12/16/2022   AST 12 (L) 12/16/2022   ALT 12 12/16/2022   ANIONGAP 12 12/22/2022    CBG (last 3)  Recent Labs    12/24/22 1145 12/24/22 1646 12/25/22 0745  GLUCAP 112* 113* 148*       Coagulation Profile: No results for input(s): "INR", "PROTIME" in the last 168 hours.   Radiology Studies: No results found.     Hosie Poisson M.D. [facility] 12/25/2022, 7:58 PM  Available via Epic secure chat 7am-7pm After 7 pm, please refer to night coverage provider listed on amion.

## 2022-12-25 NOTE — Progress Notes (Signed)
Mobility Specialist Progress Note   12/25/22 1240  Mobility  Activity  (Propelled in Wheelchair)  Level of Assistance Moderate assist, patient does 50-74%  Hospital doctor Ambulated (ft)  (Propelled 51ft*)  LLE Weight Bearing NWB  Activity Response Tolerated well  Mobility Referral Yes   Received pt in bed eager for w/c mobility and having no complaints. ModA for lateral scoot transfer to w/c. Propelled in hallway w/ obstacles present and pt able to maneuver around them w/o fault, required mod cues for w/c mechanics. ModA to laterally scoot back to bed, call bell placed in reach w/ all needs met.  [place] Mobility Specialist Please contact via SecureChat or  Rehab office at [phone]

## 2022-12-25 NOTE — Progress Notes (Signed)
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 PMH includes asthma, bioplar 1, depression, schizoaffective disorder, CVA, chronic CHF, homelessness, HTN, DM2, cocaine and cannabis use, and R AKA.   OT comments  Pt in bed upon therapy arrival. Session focused on reviewing therapy goals as well as making additional goals. Pt participated in goal setting stating that he wants to be able get into his w/c himself, be able to get on the toilet himself also. Pt has not received a shower since he's been here and is agreeable to take a shower tomorrow during therapy session to assess bathing. Pt reports continued pain in his left residual limb that on average is an 8-9 out of 10. Pt is open to trying alternative pain management techniques besides prescribed pain medications as pain meds alone do not decrease the pain enough. Suggested using an ice pack for 10-15 minutes at a time at end of residual limb to help numb the pain. Pt is open to try and ice pack was provided at end session.    Recommendations for follow up therapy are one component of a multi-disciplinary discharge planning process, led by the attending physician.  Recommendations may be updated based on patient status, additional functional criteria and insurance authorization.    Follow Up Recommendations  Skilled nursing-short term rehab (<3 hours/day)     Assistance Recommended at Discharge PRN  Patient can return home with the following  A little help with walking and/or transfers;A little help with bathing/dressing/bathroom;Help with stairs or ramp for entrance;Assist for transportation   Equipment Recommendations  Other (comment) (defer to next venue of care)       Precautions / Restrictions Precautions Precautions: Fall Precaution  Comments: R AKA, L BKA Restrictions Weight Bearing Restrictions: Yes LLE Weight Bearing: Non weight bearing Other Position/Activity Restrictions: L wound vac          Balance Overall balance assessment: Modified Independent Sitting-balance support: Feet unsupported, Single extremity supported Sitting balance-Leahy Scale: Fair Sitting balance - Comments: sitting EOB during session without LOB or physical assist to maintain balance.               ADL either performed or assessed with clinical judgement      Cognition Arousal/Alertness: Awake/alert Behavior During Therapy: WFL for tasks assessed/performed, Anxious Overall Cognitive Status: No family/caregiver present to determine baseline cognitive functioning     Current Attention Level: Sustained       Awareness: Emergent                       Pertinent Vitals/ Pain       Pain Assessment Pain Assessment: Faces Faces Pain Scale: Hurts little more Pain Location: left residual limb. Pt reports that on average his pain is an 8-9 out of 10. Pain Descriptors / Indicators: Operative site guarding, Grimacing, Guarding         Frequency  Min 2X/week        Progress Toward Goals  OT Goals(current goals can now be found in the care plan section)  Progress towards OT goals: Progressing toward goals  Acute Rehab OT Goals Patient Stated Goal: pt reports that he needs to work on transferring. OT Goal Formulation: With patient Time For Goal Achievement: 01/08/23 Potential to Achieve Goals: Good  Plan Discharge plan  remains appropriate;Frequency remains appropriate       AM-PAC OT "6 Clicks" Daily Activity     Outcome Measure   Help from another person eating meals?: None Help from another person taking care of personal grooming?: None Help from another person toileting, which includes using toliet, bedpan, or urinal?: A Little Help from another person bathing (including washing, rinsing, drying)?: A  Little Help from another person to put on and taking off regular upper body clothing?: None Help from another person to put on and taking off regular lower body clothing?: A Little 6 Click Score: 21    End of Session    OT Visit Diagnosis: Muscle weakness (generalized) (M62.81);Pain Pain - Right/Left: Left Pain - part of body: Leg   Activity Tolerance Patient tolerated treatment well   Patient Left in bed;with call bell/phone within reach           Time: 1559-1621 OT Time Calculation (min): 22 min  Charges: OT General Charges $OT Visit: 1 Visit OT Treatments $Therapeutic Activity: 8-22 mins  Ailene Ravel, OTR/L,CBIS  Supplemental OT - [facility] Secure Chat Preferred    [name], [name] 12/25/2022, 4:40 PM

## 2022-12-25 NOTE — Progress Notes (Signed)
Dressing changed per pt request

## 2022-12-26 DIAGNOSIS — I10 Essential (primary) hypertension: Secondary | ICD-10-CM | POA: Diagnosis not present

## 2022-12-26 DIAGNOSIS — J449 Chronic obstructive pulmonary disease, unspecified: Secondary | ICD-10-CM | POA: Diagnosis not present

## 2022-12-26 DIAGNOSIS — I5040 Unspecified combined systolic (congestive) and diastolic (congestive) heart failure: Secondary | ICD-10-CM | POA: Diagnosis not present

## 2022-12-26 DIAGNOSIS — I96 Gangrene, not elsewhere classified: Secondary | ICD-10-CM | POA: Diagnosis not present

## 2022-12-26 MED ORDER — TRAMADOL HCL 50 MG PO TABS
50.0000 mg | ORAL_TABLET | Freq: Two times a day (BID) | ORAL | Status: DC | PRN
  Administered 2022-12-27 – 2023-04-12 (×12): 50 mg via ORAL
  Filled 2022-12-26 (×16): qty 1

## 2022-12-26 MED ORDER — DOXYCYCLINE HYCLATE 100 MG PO TABS
100.0000 mg | ORAL_TABLET | Freq: Two times a day (BID) | ORAL | Status: AC
  Administered 2022-12-26 – 2023-01-08 (×27): 100 mg via ORAL
  Filled 2022-12-26 (×27): qty 1

## 2022-12-26 MED ORDER — HYDROXYZINE HCL 25 MG PO TABS
25.0000 mg | ORAL_TABLET | Freq: Three times a day (TID) | ORAL | Status: DC | PRN
  Administered 2022-12-27 – 2023-01-14 (×5): 25 mg via ORAL
  Filled 2022-12-26 (×7): qty 1

## 2022-12-26 NOTE — Progress Notes (Signed)
Mobility Specialist Progress Note   12/26/22 1515  Mobility  Activity  (Propelled in w/c)  Level of Assistance Standby assist, set-up cues, supervision of patient - no hands on  Assistive Device Wheelchair  Distance Ambulated (ft)  (Propelled 531ft)  LLE Weight Bearing NWB  Activity Response Tolerated well  Mobility Referral Yes  $Mobility charge 1 Mobility    Received pt in hallway requesting mobility session but having slight pain in LLE. Propelled in hallway w/ obstacles in place but pt having no faults. Returned back to room and stabilized w/c while pt AP transferred back to bed w/o incident. Call bell placed in reach and bed alarm on.   Holland Falling Mobility Specialist Please contact via SecureChat or  Rehab office at [phone]

## 2022-12-26 NOTE — Progress Notes (Signed)
[facility]                                                                               [name], is a 57 y.o. male, DOB - 06-02-1966, RSW:[id number] Admit date - 12/07/2022    Outpatient Primary MD for the patient is [name], [name], NP  LOS - 19  days    Brief summary   [name] is a 57 y.o. male with PMH chronic combined CHF, CAD, HLD, polyneuropathy, bipolar d/o, COPD, homelessness. Patient presented with left lower extremity chronic venous stasis wound with concern for superimposed infection.  He had been followed outpatient by podiatry as well prior to hospitalization. He was evaluated by orthopedic surgery and was recommended for undergoing left BKA.   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  Appreciate Dr Jess Barters recommendations.  Continue with doxycycline to complete the course of 2 weeks.  Pain control and therapy evals.     Hypertension:  BP slightly elevated. On prn labetolol.  Continue with imdur , amlodipine and lasix.     Chronic combined CHF.  He appears compensated. He denies any sob.  Continue with lasix 40 mg daily.    COPD;  No wheezing heard.    Anxiety:  Started the patient on atarax.      Estimated body mass index is 26.47 kg/m as calculated from the following:   Height as of this encounter: 5\' 9"  (1.753 m).   Weight as of this encounter: 81.3 kg.  Code Status: full code.  DVT Prophylaxis:  SCD's Start: 12/10/22 1731   Level of Care: Level of care: Telemetry Surgical Family Communication: none at bedside.   Disposition Plan:     Remains inpatient appropriate:  homeless   Procedures:  None.   Consultants:   Orthopedics.   Antimicrobials:   Anti-infectives (From admission, onward)    Start     Dose/Rate Route Frequency Ordered Stop   12/26/22 2200  doxycycline (VIBRA-TABS)  tablet 100 mg        100 mg Oral Every 12 hours 12/26/22 1307 01/09/23 2159   12/24/22 1000  doxycycline (VIBRA-TABS) tablet 100 mg  Status:  Discontinued        100 mg Oral Every 12 hours 12/24/22 0715 12/26/22 1307   12/10/22 1830  ceFAZolin (ANCEF) IVPB 2g/100 mL premix  Status:  Discontinued        2 g 200 mL/hr over 30 Minutes Intravenous Every 8 hours 12/10/22 1731 12/10/22 1740   12/10/22 1330  ceFAZolin (ANCEF) IVPB 2g/100 mL premix        2 g 200 mL/hr over 30 Minutes Intravenous On call to O.R. 12/10/22 0959 12/10/22 1534   12/08/22 0530  vancomycin (VANCOREADY) IVPB 1500 mg/300 mL        1,500 mg 150 mL/hr over 120 Minutes Intravenous Every 12 hours 12/07/22 1741 12/11/22 0955   12/08/22 0345  metroNIDAZOLE (FLAGYL) IVPB 500 mg        500 mg 100 mL/hr over 60 Minutes Intravenous Every 12 hours  12/07/22 1742 12/12/22 0344   12/07/22 2330  ceFEPIme (MAXIPIME) 2 g in sodium chloride 0.9 % 100 mL IVPB        2 g 200 mL/hr over 30 Minutes Intravenous Every 8 hours 12/07/22 1741 12/11/22 1529   12/07/22 2200  metroNIDAZOLE (FLAGYL) IVPB 500 mg  Status:  Discontinued        500 mg 100 mL/hr over 60 Minutes Intravenous Every 12 hours 12/07/22 2048 12/07/22 2056   12/07/22 1530  ceFEPIme (MAXIPIME) 2 g in sodium chloride 0.9 % 100 mL IVPB        2 g 200 mL/hr over 30 Minutes Intravenous  Once 12/07/22 1516 12/07/22 1734   12/07/22 1530  vancomycin (VANCOREADY) IVPB 1500 mg/300 mL        1,500 mg 150 mL/hr over 120 Minutes Intravenous  Once 12/07/22 1516 12/09/22 0333   12/07/22 1515  metroNIDAZOLE (FLAGYL) IVPB 500 mg        500 mg 100 mL/hr over 60 Minutes Intravenous  Once 12/07/22 1506 12/07/22 1734   12/07/22 1500  ceFAZolin (ANCEF) IVPB 1 g/50 mL premix  Status:  Discontinued        1 g 100 mL/hr over 30 Minutes Intravenous  Once 12/07/22 1452 12/07/22 1513        Medications  Scheduled Meds:  amLODipine  10 mg Oral Daily   vitamin C  1,000 mg Oral Daily    collagenase   Topical Daily   docusate sodium  100 mg Oral Daily   doxycycline  100 mg Oral Q12H   folic acid  1 mg Oral Daily   furosemide  40 mg Oral Daily   gabapentin  900 mg Oral TID   hydrocerin   Topical Daily   isosorbide mononitrate  15 mg Oral Daily   multivitamin with minerals  1 tablet Oral Daily   nutrition supplement (JUVEN)  1 packet Oral BID BM   pantoprazole  40 mg Oral Daily   polyethylene glycol  17 g Oral Daily   senna-docusate  1 tablet Oral BID   thiamine  100 mg Oral Daily   Continuous Infusions:  magnesium sulfate bolus IVPB     PRN Meds:.albuterol, alum & mag hydroxide-simeth, bisacodyl, guaiFENesin-dextromethorphan, HYDROmorphone (DILAUDID) injection, hydrOXYzine, labetalol, magnesium citrate, magnesium sulfate bolus IVPB, morphine injection, ondansetron, oxyCODONE, phenol, traMADol    Subjective:   [name] was seen and examined today.  Anxious. And itching over the wound.  Objective:   Vitals:   12/24/22 2014 12/25/22 0812 12/25/22 2007 12/26/22 0806  BP: (!) 145/104 (!) 148/98 (!) 142/89 (!) 152/100  Pulse:   82 95  Resp: 20  18 16   Temp: 98.5 F (36.9 C)  98.7 F (37.1 C) 98 F (36.7 C)  TempSrc: Oral  Oral Oral  SpO2: 100%  100% 98%  Weight:      Height:        Intake/Output Summary (Last 24 hours) at 12/26/2022 1726 Last data filed at 12/26/2022 1500 Gross per 24 hour  Intake 720 ml  Output 1000 ml  Net -280 ml    Filed Weights   12/18/22 0424 12/19/22 0500 12/23/22 0453  Weight: 87.4 kg 73.3 kg 81.3 kg     Exam General exam: Appears calm and comfortable  Respiratory system: Clear to auscultation. Respiratory effort normal. Cardiovascular system: S1 & S2 heard, RRR. No JVD,  No pedal edema. Gastrointestinal system: Abdomen is nondistended, soft and nontender.  Central nervous system: Alert and oriented.  No focal neurological deficits. Extremities: left bka.  Skin: No rashes, lesions or ulcers Psychiatry: mood is  appropriate.    Data Reviewed:  I have personally reviewed following labs and imaging studies   CBC Lab Results  Component Value Date   WBC 6.7 12/22/2022   RBC 4.16 (L) 12/22/2022   HGB 12.4 (L) 12/22/2022   HCT 38.8 (L) 12/22/2022   MCV 93.3 12/22/2022   MCH 29.8 12/22/2022   PLT 446 (H) 12/22/2022   MCHC 32.0 12/22/2022   RDW 14.3 12/22/2022   LYMPHSABS 2.6 12/22/2022   MONOABS 0.9 12/22/2022   EOSABS 0.2 12/22/2022   BASOSABS 0.0 19/41/7408     Last metabolic panel Lab Results  Component Value Date   NA 137 12/22/2022   K 4.0 12/22/2022   CL 102 12/22/2022   CO2 23 12/22/2022   BUN 17 12/22/2022   CREATININE 0.84 12/22/2022   GLUCOSE 100 (H) 12/22/2022   GFRNONAA >60 12/22/2022   GFRAA >60 08/21/2020   CALCIUM 9.0 12/22/2022   PHOS 4.7 (H) 07/18/2022   PROT 7.0 12/16/2022   ALBUMIN 3.0 (L) 12/16/2022   BILITOT 0.5 12/16/2022   ALKPHOS 57 12/16/2022   AST 12 (L) 12/16/2022   ALT 12 12/16/2022   ANIONGAP 12 12/22/2022    CBG (last 3)  Recent Labs    12/24/22 1145 12/24/22 1646 12/25/22 0745  GLUCAP 112* 113* 148*       Coagulation Profile: No results for input(s): "INR", "PROTIME" in the last 168 hours.   Radiology Studies: No results found.     Hosie Poisson M.D. [facility] 12/26/2022, 5:26 PM  Available via Epic secure chat 7am-7pm After 7 pm, please refer to night coverage provider listed on amion.

## 2022-12-26 NOTE — Progress Notes (Signed)
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 PMH includes asthma, bioplar 1, depression, schizoaffective disorder, CVA, chronic CHF, homelessness, HTN, DM2, cocaine and cannabis use, and R AKA.   OT comments  Pt in bed upon therapy arrival. Agreeable to take a shower during OT session. Session focused on ADL re-training, functional transfers, safety awareness, sitting balance while participating in bathing and dressing. Pt was provided with education on safety and effect transfer techniques when transferring from surface to surface. For majority of shower, pt held onto grab bar for safety although able to let go and wash briefly without UE support. Increased time required to complete lateral scoot transfer as no SB was utilized. OT will continue to follow patient acutely.    Recommendations for follow up therapy are one component of a multi-disciplinary discharge planning process, led by the attending physician.  Recommendations may be updated based on patient status, additional functional criteria and insurance authorization.    Follow Up Recommendations  Skilled nursing-short term rehab (<3 hours/day)     Assistance Recommended at Discharge PRN  Patient can return home with the following  A little help with walking and/or transfers;A little help with bathing/dressing/bathroom;Help with stairs or ramp for entrance;Assist for transportation   Equipment Recommendations  Other (comment) (defer to next venue)       Precautions / Restrictions Precautions Precautions: Fall Precaution Comments: R AKA, L BKA Restrictions Weight Bearing Restrictions: Yes LLE Weight Bearing: Non weight bearing Other Position/Activity Restrictions: L wound vac       Mobility Bed Mobility Overal bed mobility:  Modified Independent     Patient Response: Cooperative  Transfers Overall transfer level: Needs assistance Equipment used: None Transfers: Bed to chair/wheelchair/BSC            Lateral/Scoot Transfers: Min assist, From elevated surface       Balance Overall balance assessment: Needs assistance Sitting-balance support: Feet unsupported, Single extremity supported Sitting balance-Leahy Scale: Fair Sitting balance - Comments: sitting on built in shower bench shower                 ADL either performed or assessed with clinical judgement   ADL Overall ADL's : Needs assistance/impaired     Grooming: Wash/dry face;Sitting;Supervision/safety Grooming Details (indicate cue type and reason): in shower Upper Body Bathing: Sitting;Supervision/ safety Upper Body Bathing Details (indicate cue type and reason): in shower Lower Body Bathing: Supervison/ safety;Sitting/lateral leans Lower Body Bathing Details (indicate cue type and reason): in shower Upper Body Dressing : Set up;Sitting               Tub/ Shower Transfer: Minimal assistance;Grab bars Tub/Shower Transfer Details (indicate cue type and reason): built in shower seat          Cognition Arousal/Alertness: Awake/alert Behavior During Therapy: WFL for tasks assessed/performed, Anxious Overall Cognitive Status: No family/caregiver present to determine baseline cognitive functioning                         Pertinent Vitals/ Pain       Pain Assessment Pain Assessment: Faces Faces Pain Scale: Hurts little more Pain Location: left residual limb Pain Descriptors / Indicators: Operative site guarding, Grimacing, Guarding Pain Intervention(s): Limited activity within patient's tolerance, Monitored during session  Frequency  Min 2X/week        Progress Toward Goals  OT Goals(current goals can now be found in the care plan section)  Progress towards OT goals: Progressing toward  goals     Plan Discharge plan remains appropriate;Frequency remains appropriate       AM-PAC OT "6 Clicks" Daily Activity     Outcome Measure   Help from another person eating meals?: None Help from another person taking care of personal grooming?: None Help from another person toileting, which includes using toliet, bedpan, or urinal?: A Little Help from another person bathing (including washing, rinsing, drying)?: A Little Help from another person to put on and taking off regular upper body clothing?: None Help from another person to put on and taking off regular lower body clothing?: A Little 6 Click Score: 21    End of Session    OT Visit Diagnosis: Muscle weakness (generalized) (M62.81);Pain Pain - Right/Left: Left Pain - part of body: Leg   Activity Tolerance Patient tolerated treatment well   Patient Left in chair (manual wheelchair)           Time: 4098-1191 OT Time Calculation (min): 47 min  Charges: OT General Charges $OT Visit: 1 Visit OT Treatments $Self Care/Home Management : 38-52 mins  Ailene Ravel, OTR/L,CBIS  Supplemental OT - [facility] Secure Chat Preferred    Huntington Leverich, [name] 12/26/2022, 5:08 PM

## 2022-12-26 NOTE — Progress Notes (Signed)
[facility]                                                                               [name], is a 57 y.o. male, DOB - Oct 14, 1966, DEY:[id number] Admit date - 12/07/2022    Outpatient Primary MD for the patient is [name], [name], NP  LOS - 19  days    Brief summary   [name] is a 57 y.o. male with PMH chronic combined CHF, CAD, HLD, polyneuropathy, bipolar d/o, COPD, homelessness. Patient presented with left lower extremity chronic venous stasis wound with concern for superimposed infection.  He had been followed outpatient by podiatry as well prior to hospitalization. He was evaluated by orthopedic surgery and was recommended for undergoing left BKA.   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  Appreciate Dr Jess Barters recommendations.  Continue with doxycycline to complete the course of 2 weeks.  Pain control and therapy evals.     Hypertension:  BP parameters are improving. Prn hydralazine.  Continue with imdur , amlodipine and lasix.     Chronic combined CHF.  He appears compensated. He denies any sob.  Continue with lasix 40 mg daily.    COPD;  No wheezing heard.    Anxiety:  Started the patient on atarax.      Estimated body mass index is 26.47 kg/m as calculated from the following:   Height as of this encounter: 5\' 9"  (1.753 m).   Weight as of this encounter: 81.3 kg.  Code Status: full code.  DVT Prophylaxis:  SCD's Start: 12/10/22 1731   Level of Care: Level of care: Telemetry Surgical Family Communication: none at bedside.   Disposition Plan:     Remains inpatient appropriate:  homeless   Procedures:  None.   Consultants:   Orthopedics.   Antimicrobials:   Anti-infectives (From admission, onward)    Start     Dose/Rate Route Frequency Ordered Stop   12/26/22 2200  doxycycline  (VIBRA-TABS) tablet 100 mg        100 mg Oral Every 12 hours 12/26/22 1307 01/09/23 2159   12/24/22 1000  doxycycline (VIBRA-TABS) tablet 100 mg  Status:  Discontinued        100 mg Oral Every 12 hours 12/24/22 0715 12/26/22 1307   12/10/22 1830  ceFAZolin (ANCEF) IVPB 2g/100 mL premix  Status:  Discontinued        2 g 200 mL/hr over 30 Minutes Intravenous Every 8 hours 12/10/22 1731 12/10/22 1740   12/10/22 1330  ceFAZolin (ANCEF) IVPB 2g/100 mL premix        2 g 200 mL/hr over 30 Minutes Intravenous On call to O.R. 12/10/22 0959 12/10/22 1534   12/08/22 0530  vancomycin (VANCOREADY) IVPB 1500 mg/300 mL        1,500 mg 150 mL/hr over 120 Minutes Intravenous Every 12 hours 12/07/22 1741 12/11/22 0955   12/08/22 0345  metroNIDAZOLE (FLAGYL) IVPB 500 mg        500 mg 100 mL/hr over 60 Minutes Intravenous Every 12 hours  12/07/22 1742 12/12/22 0344   12/07/22 2330  ceFEPIme (MAXIPIME) 2 g in sodium chloride 0.9 % 100 mL IVPB        2 g 200 mL/hr over 30 Minutes Intravenous Every 8 hours 12/07/22 1741 12/11/22 1529   12/07/22 2200  metroNIDAZOLE (FLAGYL) IVPB 500 mg  Status:  Discontinued        500 mg 100 mL/hr over 60 Minutes Intravenous Every 12 hours 12/07/22 2048 12/07/22 2056   12/07/22 1530  ceFEPIme (MAXIPIME) 2 g in sodium chloride 0.9 % 100 mL IVPB        2 g 200 mL/hr over 30 Minutes Intravenous  Once 12/07/22 1516 12/07/22 1734   12/07/22 1530  vancomycin (VANCOREADY) IVPB 1500 mg/300 mL        1,500 mg 150 mL/hr over 120 Minutes Intravenous  Once 12/07/22 1516 12/09/22 0333   12/07/22 1515  metroNIDAZOLE (FLAGYL) IVPB 500 mg        500 mg 100 mL/hr over 60 Minutes Intravenous  Once 12/07/22 1506 12/07/22 1734   12/07/22 1500  ceFAZolin (ANCEF) IVPB 1 g/50 mL premix  Status:  Discontinued        1 g 100 mL/hr over 30 Minutes Intravenous  Once 12/07/22 1452 12/07/22 1513        Medications  Scheduled Meds:  amLODipine  10 mg Oral Daily   vitamin C  1,000 mg Oral Daily    collagenase   Topical Daily   docusate sodium  100 mg Oral Daily   doxycycline  100 mg Oral Q12H   folic acid  1 mg Oral Daily   furosemide  40 mg Oral Daily   gabapentin  900 mg Oral TID   hydrocerin   Topical Daily   isosorbide mononitrate  15 mg Oral Daily   multivitamin with minerals  1 tablet Oral Daily   nutrition supplement (JUVEN)  1 packet Oral BID BM   pantoprazole  40 mg Oral Daily   polyethylene glycol  17 g Oral Daily   senna-docusate  1 tablet Oral BID   thiamine  100 mg Oral Daily   Continuous Infusions:  magnesium sulfate bolus IVPB     PRN Meds:.albuterol, alum & mag hydroxide-simeth, bisacodyl, guaiFENesin-dextromethorphan, HYDROmorphone (DILAUDID) injection, hydrOXYzine, labetalol, magnesium citrate, magnesium sulfate bolus IVPB, morphine injection, ondansetron, oxyCODONE, phenol, traMADol    Subjective:   [name] was seen and examined today.  Anxious. And itching over the wound.  Objective:   Vitals:   12/24/22 2014 12/25/22 0812 12/25/22 2007 12/26/22 0806  BP: (!) 145/104 (!) 148/98 (!) 142/89 (!) 152/100  Pulse:   82 95  Resp: 20  18 16   Temp: 98.5 F (36.9 C)  98.7 F (37.1 C) 98 F (36.7 C)  TempSrc: Oral  Oral Oral  SpO2: 100%  100% 98%  Weight:      Height:        Intake/Output Summary (Last 24 hours) at 12/26/2022 1928 Last data filed at 12/26/2022 1500 Gross per 24 hour  Intake 720 ml  Output 1000 ml  Net -280 ml    Filed Weights   12/18/22 0424 12/19/22 0500 12/23/22 0453  Weight: 87.4 kg 73.3 kg 81.3 kg     Exam General exam: Appears calm and comfortable  Respiratory system: Clear to auscultation. Respiratory effort normal. Cardiovascular system: S1 & S2 heard, RRR. No JVD,  No pedal edema. Gastrointestinal system: Abdomen is nondistended, soft and nontender.  Central nervous system: Alert and oriented.  No focal neurological deficits. Extremities: left bka.  Skin: No rashes, lesions or ulcers Psychiatry: mood is  appropriate.    Data Reviewed:  I have personally reviewed following labs and imaging studies   CBC Lab Results  Component Value Date   WBC 6.7 12/22/2022   RBC 4.16 (L) 12/22/2022   HGB 12.4 (L) 12/22/2022   HCT 38.8 (L) 12/22/2022   MCV 93.3 12/22/2022   MCH 29.8 12/22/2022   PLT 446 (H) 12/22/2022   MCHC 32.0 12/22/2022   RDW 14.3 12/22/2022   LYMPHSABS 2.6 12/22/2022   MONOABS 0.9 12/22/2022   EOSABS 0.2 12/22/2022   BASOSABS 0.0 20/25/4270     Last metabolic panel Lab Results  Component Value Date   NA 137 12/22/2022   K 4.0 12/22/2022   CL 102 12/22/2022   CO2 23 12/22/2022   BUN 17 12/22/2022   CREATININE 0.84 12/22/2022   GLUCOSE 100 (H) 12/22/2022   GFRNONAA >60 12/22/2022   GFRAA >60 08/21/2020   CALCIUM 9.0 12/22/2022   PHOS 4.7 (H) 07/18/2022   PROT 7.0 12/16/2022   ALBUMIN 3.0 (L) 12/16/2022   BILITOT 0.5 12/16/2022   ALKPHOS 57 12/16/2022   AST 12 (L) 12/16/2022   ALT 12 12/16/2022   ANIONGAP 12 12/22/2022    CBG (last 3)  Recent Labs    12/24/22 1145 12/24/22 1646 12/25/22 0745  GLUCAP 112* 113* 148*       Coagulation Profile: No results for input(s): "INR", "PROTIME" in the last 168 hours.   Radiology Studies: No results found.     Hosie Poisson M.D. [facility] 12/26/2022, 7:28 PM  Available via Epic secure chat 7am-7pm After 7 pm, please refer to night coverage provider listed on amion.

## 2022-12-27 DIAGNOSIS — I96 Gangrene, not elsewhere classified: Secondary | ICD-10-CM | POA: Diagnosis not present

## 2022-12-27 DIAGNOSIS — I10 Essential (primary) hypertension: Secondary | ICD-10-CM | POA: Diagnosis not present

## 2022-12-27 DIAGNOSIS — J449 Chronic obstructive pulmonary disease, unspecified: Secondary | ICD-10-CM | POA: Diagnosis not present

## 2022-12-27 DIAGNOSIS — I5040 Unspecified combined systolic (congestive) and diastolic (congestive) heart failure: Secondary | ICD-10-CM | POA: Diagnosis not present

## 2022-12-27 LAB — CBC WITH DIFFERENTIAL/PLATELET
Abs Immature Granulocytes: 0.01 10*3/uL (ref 0.00–0.07)
Basophils Absolute: 0.1 10*3/uL (ref 0.0–0.1)
Basophils Relative: 1 %
Eosinophils Absolute: 0.1 10*3/uL (ref 0.0–0.5)
Eosinophils Relative: 2 %
HCT: 41.3 % (ref 39.0–52.0)
Hemoglobin: 14.1 g/dL (ref 13.0–17.0)
Immature Granulocytes: 0 %
Lymphocytes Relative: 33 %
Lymphs Abs: 2.1 10*3/uL (ref 0.7–4.0)
MCH: 30.5 pg (ref 26.0–34.0)
MCHC: 34.1 g/dL (ref 30.0–36.0)
MCV: 89.4 fL (ref 80.0–100.0)
Monocytes Absolute: 0.7 10*3/uL (ref 0.1–1.0)
Monocytes Relative: 11 %
Neutro Abs: 3.2 10*3/uL (ref 1.7–7.7)
Neutrophils Relative %: 53 %
Platelets: 398 10*3/uL (ref 150–400)
RBC: 4.62 MIL/uL (ref 4.22–5.81)
RDW: 13.8 % (ref 11.5–15.5)
WBC: 6.2 10*3/uL (ref 4.0–10.5)
nRBC: 0 % (ref 0.0–0.2)

## 2022-12-27 LAB — BASIC METABOLIC PANEL
Anion gap: 13 (ref 5–15)
BUN: 20 mg/dL (ref 6–20)
CO2: 23 mmol/L (ref 22–32)
Calcium: 9.4 mg/dL (ref 8.9–10.3)
Chloride: 100 mmol/L (ref 98–111)
Creatinine, Ser: 0.88 mg/dL (ref 0.61–1.24)
GFR, Estimated: >60 mL/min (ref >60)
Glucose, Bld: 109 mg/dL — ABNORMAL HIGH (ref 70–99)
Potassium: 3.8 mmol/L (ref 3.5–5.1)
Sodium: 136 mmol/L (ref 135–145)

## 2022-12-27 NOTE — Progress Notes (Signed)
Pt c/o sob,  elevated hob,  administered albuterol treatment

## 2022-12-27 NOTE — Progress Notes (Signed)
[facility]                                                                               [name], is a 57 y.o. male, DOB - July 23, 1966, WJX:[id number] Admit date - 12/07/2022    Outpatient Primary MD for the patient is [name], [name], NP  LOS - 20  days    Brief summary   [name] is a 57 y.o. male with PMH chronic combined CHF, CAD, HLD, polyneuropathy, bipolar d/o, COPD, homelessness. Patient presented with left lower extremity chronic venous stasis wound with concern for superimposed infection.  He had been followed outpatient by podiatry as well prior to hospitalization. He was evaluated by orthopedic surgery and was recommended for undergoing left BKA. No new complaints today.  Currently waiting for SNF.    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  Appreciate Dr Jess Barters recommendations.  Continue with doxycycline to complete the course of 2 weeks.  Pain control and therapy evals.  PAIN Control with neurontin and IV Dilaudid and 20 mg of oxycodone every 4 hours as needed   Hypertension:  Blood pressure parameters are well-controlled. Prn hydralazine.  Continue with imdur , amlodipine and lasix.     Chronic combined CHF.  He appears compensated. He denies any sob. He is on RA.   Continue with lasix 40 mg daily.    COPD;  No wheezing heard.    Anxiety:  Started the patient on atarax.   Gerd  Stable on PPI.    Constipation patient is on senna Colace and MiraLAX     Estimated body mass index is 26.47 kg/m as calculated from the following:   Height as of this encounter: 5\' 9"  (1.753 m).   Weight as of this encounter: 81.3 kg.  Code Status: full code.  DVT Prophylaxis:  SCD's Start: 12/10/22 1731   Level of Care: Level of care: Telemetry Surgical Family Communication: none at bedside.   Disposition Plan:      Remains inpatient appropriate:  homeless   Procedures:  None.   Consultants:   Orthopedics.   Antimicrobials:   Anti-infectives (From admission, onward)    Start     Dose/Rate Route Frequency Ordered Stop   12/26/22 2200  doxycycline (VIBRA-TABS) tablet 100 mg        100 mg Oral Every 12 hours 12/26/22 1307 01/09/23 2159   12/24/22 1000  doxycycline (VIBRA-TABS) tablet 100 mg  Status:  Discontinued        100 mg Oral Every 12 hours 12/24/22 0715 12/26/22 1307   12/10/22 1830  ceFAZolin (ANCEF) IVPB 2g/100 mL premix  Status:  Discontinued        2 g 200 mL/hr over 30 Minutes Intravenous Every 8 hours 12/10/22 1731 12/10/22 1740   12/10/22 1330  ceFAZolin (ANCEF) IVPB 2g/100 mL premix        2 g 200 mL/hr over 30 Minutes Intravenous On call to O.R. 12/10/22 0959 12/10/22 1534   12/08/22 0530  vancomycin (VANCOREADY) IVPB 1500 mg/300 mL  1,500 mg 150 mL/hr over 120 Minutes Intravenous Every 12 hours 12/07/22 1741 12/11/22 0955   12/08/22 0345  metroNIDAZOLE (FLAGYL) IVPB 500 mg        500 mg 100 mL/hr over 60 Minutes Intravenous Every 12 hours 12/07/22 1742 12/12/22 0344   12/07/22 2330  ceFEPIme (MAXIPIME) 2 g in sodium chloride 0.9 % 100 mL IVPB        2 g 200 mL/hr over 30 Minutes Intravenous Every 8 hours 12/07/22 1741 12/11/22 1529   12/07/22 2200  metroNIDAZOLE (FLAGYL) IVPB 500 mg  Status:  Discontinued        500 mg 100 mL/hr over 60 Minutes Intravenous Every 12 hours 12/07/22 2048 12/07/22 2056   12/07/22 1530  ceFEPIme (MAXIPIME) 2 g in sodium chloride 0.9 % 100 mL IVPB        2 g 200 mL/hr over 30 Minutes Intravenous  Once 12/07/22 1516 12/07/22 1734   12/07/22 1530  vancomycin (VANCOREADY) IVPB 1500 mg/300 mL        1,500 mg 150 mL/hr over 120 Minutes Intravenous  Once 12/07/22 1516 12/09/22 0333   12/07/22 1515  metroNIDAZOLE (FLAGYL) IVPB 500 mg        500 mg 100 mL/hr over 60 Minutes Intravenous  Once 12/07/22 1506 12/07/22 1734   12/07/22 1500   ceFAZolin (ANCEF) IVPB 1 g/50 mL premix  Status:  Discontinued        1 g 100 mL/hr over 30 Minutes Intravenous  Once 12/07/22 1452 12/07/22 1513        Medications  Scheduled Meds:  amLODipine  10 mg Oral Daily   vitamin C  1,000 mg Oral Daily   collagenase   Topical Daily   docusate sodium  100 mg Oral Daily   doxycycline  100 mg Oral Q12H   folic acid  1 mg Oral Daily   furosemide  40 mg Oral Daily   gabapentin  900 mg Oral TID   hydrocerin   Topical Daily   isosorbide mononitrate  15 mg Oral Daily   multivitamin with minerals  1 tablet Oral Daily   nutrition supplement (JUVEN)  1 packet Oral BID BM   pantoprazole  40 mg Oral Daily   polyethylene glycol  17 g Oral Daily   senna-docusate  1 tablet Oral BID   thiamine  100 mg Oral Daily   Continuous Infusions:  magnesium sulfate bolus IVPB     PRN Meds:.albuterol, alum & mag hydroxide-simeth, bisacodyl, guaiFENesin-dextromethorphan, HYDROmorphone (DILAUDID) injection, hydrOXYzine, labetalol, magnesium citrate, magnesium sulfate bolus IVPB, morphine injection, ondansetron, oxyCODONE, phenol, traMADol    Subjective:   [name] was seen and examined today.  No new complaints today Objective:   Vitals:   12/26/22 2228 12/27/22 0537 12/27/22 0713 12/27/22 1500  BP:  138/80  127/89  Pulse:  88  95  Resp:  20  18  Temp:  98.3 F (36.8 C)  98.7 F (37.1 C)  TempSrc:    Oral  SpO2: 96% 100% 100% 100%  Weight:      Height:        Intake/Output Summary (Last 24 hours) at 12/27/2022 1841 Last data filed at 12/27/2022 1300 Gross per 24 hour  Intake 600 ml  Output 2650 ml  Net -2050 ml    Filed Weights   12/18/22 0424 12/19/22 0500 12/23/22 0453  Weight: 87.4 kg 73.3 kg 81.3 kg     Exam General exam: Appears calm and comfortable  Respiratory system: Clear  to auscultation. Respiratory effort normal. Cardiovascular system: S1 & S2 heard, RRR. No JVD,  No pedal edema. Gastrointestinal system: Abdomen is  nondistended, soft and nontender.. Central nervous system: Alert and oriented. No focal neurological deficits. Extremities: left BKA.  Skin: No rashes, lesions or ulcers Psychiatry: Mood & affect appropriate.     Data Reviewed:  I have personally reviewed following labs and imaging studies   CBC Lab Results  Component Value Date   WBC 6.2 12/27/2022   RBC 4.62 12/27/2022   HGB 14.1 12/27/2022   HCT 41.3 12/27/2022   MCV 89.4 12/27/2022   MCH 30.5 12/27/2022   PLT 398 12/27/2022   MCHC 34.1 12/27/2022   RDW 13.8 12/27/2022   LYMPHSABS 2.1 12/27/2022   MONOABS 0.7 12/27/2022   EOSABS 0.1 12/27/2022   BASOSABS 0.1 84/13/2440     Last metabolic panel Lab Results  Component Value Date   NA 136 12/27/2022   K 3.8 12/27/2022   CL 100 12/27/2022   CO2 23 12/27/2022   BUN 20 12/27/2022   CREATININE 0.88 12/27/2022   GLUCOSE 109 (H) 12/27/2022   GFRNONAA >60 12/27/2022   GFRAA >60 08/21/2020   CALCIUM 9.4 12/27/2022   PHOS 4.7 (H) 07/18/2022   PROT 7.0 12/16/2022   ALBUMIN 3.0 (L) 12/16/2022   BILITOT 0.5 12/16/2022   ALKPHOS 57 12/16/2022   AST 12 (L) 12/16/2022   ALT 12 12/16/2022   ANIONGAP 13 12/27/2022    CBG (last 3)  Recent Labs    12/25/22 0745  GLUCAP 148*       Coagulation Profile: No results for input(s): "INR", "PROTIME" in the last 168 hours.   Radiology Studies: No results found.     Hosie Poisson M.D. [facility] 12/27/2022, 6:41 PM  Available via Epic secure chat 7am-7pm After 7 pm, please refer to night coverage provider listed on amion.

## 2022-12-28 DIAGNOSIS — I96 Gangrene, not elsewhere classified: Secondary | ICD-10-CM | POA: Diagnosis not present

## 2022-12-28 DIAGNOSIS — I5040 Unspecified combined systolic (congestive) and diastolic (congestive) heart failure: Secondary | ICD-10-CM | POA: Diagnosis not present

## 2022-12-28 DIAGNOSIS — J449 Chronic obstructive pulmonary disease, unspecified: Secondary | ICD-10-CM | POA: Diagnosis not present

## 2022-12-28 DIAGNOSIS — I10 Essential (primary) hypertension: Secondary | ICD-10-CM | POA: Diagnosis not present

## 2022-12-28 LAB — GLUCOSE, CAPILLARY
Glucose-Capillary: 104 mg/dL — ABNORMAL HIGH (ref 70–99)
Glucose-Capillary: 72 mg/dL (ref 70–99)
Glucose-Capillary: 106 mg/dL — ABNORMAL HIGH (ref 70–99)

## 2022-12-28 NOTE — Progress Notes (Signed)
[facility]                                                                               [name], is a 57 y.o. male, DOB - 02/28/1966, VQQ:[id number] Admit date - 12/07/2022    Outpatient Primary MD for the patient is [name], [name], NP  LOS - 21  days    Brief summary   [name] is a 57 y.o. male with PMH chronic combined CHF, CAD, HLD, polyneuropathy, bipolar d/o, COPD, homelessness. Patient presented with left lower extremity chronic venous stasis wound with concern for superimposed infection.  He had been followed outpatient by podiatry as well prior to hospitalization. He was evaluated by orthopedic surgery and was recommended for undergoing left BKA. No new complaints today.  Currently waiting for SNF. Labs unremarkable.    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  Appreciate Dr Jess Barters recommendations.  Continue with doxycycline to complete the course of 2 weeks.  Pain control and therapy evals.  PAIN Control with neurontin and IV Dilaudid and 20 mg of oxycodone every 4 hours as needed   Hypertension:  Blood pressure parameters are optimal. . Prn hydralazine.  Continue with imdur , amlodipine and lasix.     Chronic combined CHF.  He appears compensated. He denies any sob. He is on RA.   Continue with lasix 40 mg daily.    COPD;  No wheezing heard.    Anxiety:  Started the patient on atarax.   Gerd  Stable on PPI.    Constipation patient is on senna Colace and MiraLAX     Estimated body mass index is 26.47 kg/m as calculated from the following:   Height as of this encounter: 5\' 9"  (1.753 m).   Weight as of this encounter: 81.3 kg.  Code Status: full code.  DVT Prophylaxis:  SCD's Start: 12/10/22 1731   Level of Care: Level of care: Telemetry Surgical Family Communication: none at bedside.    Disposition Plan:     Remains inpatient appropriate:  homeless   Procedures:  None.   Consultants:   Orthopedics.   Antimicrobials:   Anti-infectives (From admission, onward)    Start     Dose/Rate Route Frequency Ordered Stop   12/26/22 2200  doxycycline (VIBRA-TABS) tablet 100 mg        100 mg Oral Every 12 hours 12/26/22 1307 01/09/23 2159   12/24/22 1000  doxycycline (VIBRA-TABS) tablet 100 mg  Status:  Discontinued        100 mg Oral Every 12 hours 12/24/22 0715 12/26/22 1307   12/10/22 1830  ceFAZolin (ANCEF) IVPB 2g/100 mL premix  Status:  Discontinued        2 g 200 mL/hr over 30 Minutes Intravenous Every 8 hours 12/10/22 1731 12/10/22 1740   12/10/22 1330  ceFAZolin (ANCEF) IVPB 2g/100 mL premix        2 g 200 mL/hr over 30 Minutes Intravenous On call to O.R. 12/10/22 0959 12/10/22 1534   12/08/22 0530  vancomycin (VANCOREADY) IVPB 1500  mg/300 mL        1,500 mg 150 mL/hr over 120 Minutes Intravenous Every 12 hours 12/07/22 1741 12/11/22 0955   12/08/22 0345  metroNIDAZOLE (FLAGYL) IVPB 500 mg        500 mg 100 mL/hr over 60 Minutes Intravenous Every 12 hours 12/07/22 1742 12/12/22 0344   12/07/22 2330  ceFEPIme (MAXIPIME) 2 g in sodium chloride 0.9 % 100 mL IVPB        2 g 200 mL/hr over 30 Minutes Intravenous Every 8 hours 12/07/22 1741 12/11/22 1529   12/07/22 2200  metroNIDAZOLE (FLAGYL) IVPB 500 mg  Status:  Discontinued        500 mg 100 mL/hr over 60 Minutes Intravenous Every 12 hours 12/07/22 2048 12/07/22 2056   12/07/22 1530  ceFEPIme (MAXIPIME) 2 g in sodium chloride 0.9 % 100 mL IVPB        2 g 200 mL/hr over 30 Minutes Intravenous  Once 12/07/22 1516 12/07/22 1734   12/07/22 1530  vancomycin (VANCOREADY) IVPB 1500 mg/300 mL        1,500 mg 150 mL/hr over 120 Minutes Intravenous  Once 12/07/22 1516 12/09/22 0333   12/07/22 1515  metroNIDAZOLE (FLAGYL) IVPB 500 mg        500 mg 100 mL/hr over 60 Minutes Intravenous  Once 12/07/22 1506 12/07/22 1734    12/07/22 1500  ceFAZolin (ANCEF) IVPB 1 g/50 mL premix  Status:  Discontinued        1 g 100 mL/hr over 30 Minutes Intravenous  Once 12/07/22 1452 12/07/22 1513        Medications  Scheduled Meds:  amLODipine  10 mg Oral Daily   vitamin C  1,000 mg Oral Daily   collagenase   Topical Daily   docusate sodium  100 mg Oral Daily   doxycycline  100 mg Oral Q12H   folic acid  1 mg Oral Daily   furosemide  40 mg Oral Daily   gabapentin  900 mg Oral TID   isosorbide mononitrate  15 mg Oral Daily   multivitamin with minerals  1 tablet Oral Daily   nutrition supplement (JUVEN)  1 packet Oral BID BM   pantoprazole  40 mg Oral Daily   polyethylene glycol  17 g Oral Daily   senna-docusate  1 tablet Oral BID   thiamine  100 mg Oral Daily   Continuous Infusions:  magnesium sulfate bolus IVPB     PRN Meds:.albuterol, alum & mag hydroxide-simeth, bisacodyl, guaiFENesin-dextromethorphan, HYDROmorphone (DILAUDID) injection, hydrOXYzine, labetalol, magnesium citrate, magnesium sulfate bolus IVPB, ondansetron, oxyCODONE, phenol, traMADol    Subjective:   [name] was seen and examined today.  No new complaints.  Objective:   Vitals:   12/27/22 1500 12/28/22 0500 12/28/22 0755 12/28/22 1454  BP: 127/89  (!) 156/100 126/85  Pulse: 95  (!) 101 85  Resp: 18     Temp: 98.7 F (37.1 C)  97.9 F (36.6 C) 98 F (36.7 C)  TempSrc: Oral  Oral Oral  SpO2: 100%  99% 99%  Weight:  81.3 kg    Height:        Intake/Output Summary (Last 24 hours) at 12/28/2022 1720 Last data filed at 12/28/2022 1500 Gross per 24 hour  Intake 240 ml  Output 1400 ml  Net -1160 ml    Filed Weights   12/19/22 0500 12/23/22 0453 12/28/22 0500  Weight: 73.3 kg 81.3 kg 81.3 kg     Exam General exam: Appears calm  and comfortable  Respiratory system: Clear to auscultation. Respiratory effort normal. Cardiovascular system: S1 & S2 heard, RRR. No JVD,  Gastrointestinal system: Abdomen is nondistended,  soft and nontender.  Central nervous system: Alert and oriented. No focal neurological deficits. Extremities: left BKA. Right aka.  Skin: No rashes, lesions or ulcers Psychiatry: Mood & affect appropriate.      Data Reviewed:  I have personally reviewed following labs and imaging studies   CBC Lab Results  Component Value Date   WBC 6.2 12/27/2022   RBC 4.62 12/27/2022   HGB 14.1 12/27/2022   HCT 41.3 12/27/2022   MCV 89.4 12/27/2022   MCH 30.5 12/27/2022   PLT 398 12/27/2022   MCHC 34.1 12/27/2022   RDW 13.8 12/27/2022   LYMPHSABS 2.1 12/27/2022   MONOABS 0.7 12/27/2022   EOSABS 0.1 12/27/2022   BASOSABS 0.1 83/15/1761     Last metabolic panel Lab Results  Component Value Date   NA 136 12/27/2022   K 3.8 12/27/2022   CL 100 12/27/2022   CO2 23 12/27/2022   BUN 20 12/27/2022   CREATININE 0.88 12/27/2022   GLUCOSE 109 (H) 12/27/2022   GFRNONAA >60 12/27/2022   GFRAA >60 08/21/2020   CALCIUM 9.4 12/27/2022   PHOS 4.7 (H) 07/18/2022   PROT 7.0 12/16/2022   ALBUMIN 3.0 (L) 12/16/2022   BILITOT 0.5 12/16/2022   ALKPHOS 57 12/16/2022   AST 12 (L) 12/16/2022   ALT 12 12/16/2022   ANIONGAP 13 12/27/2022    CBG (last 3)  Recent Labs    12/28/22 0757 12/28/22 1151 12/28/22 1456  GLUCAP 104* 72 106*       Coagulation Profile: No results for input(s): "INR", "PROTIME" in the last 168 hours.   Radiology Studies: No results found.     Hosie Poisson M.D. [facility] 12/28/2022, 5:20 PM  Available via Epic secure chat 7am-7pm After 7 pm, please refer to night coverage provider listed on amion.

## 2022-12-28 NOTE — Plan of Care (Signed)
  Problem: Fluid Volume: Goal: Hemodynamic stability will improve Outcome: Progressing   Problem: Clinical Measurements: Goal: Diagnostic test results will improve Outcome: Progressing Goal: Signs and symptoms of infection will decrease Outcome: Progressing   Problem: Respiratory: Goal: Ability to maintain adequate ventilation will improve Outcome: Progressing   Problem: Education: Goal: Knowledge of General Education information will improve Description: Including pain rating scale, medication(s)/side effects and non-pharmacologic comfort measures Outcome: Progressing   Problem: Health Behavior/Discharge Planning: Goal: Ability to manage health-related needs will improve Outcome: Progressing   Problem: Clinical Measurements: Goal: Ability to maintain clinical measurements within normal limits will improve Outcome: Progressing Goal: Will remain free from infection Outcome: Progressing Goal: Diagnostic test results will improve Outcome: Progressing Goal: Respiratory complications will improve Outcome: Progressing Goal: Cardiovascular complication will be avoided Outcome: Progressing   Problem: Activity: Goal: Risk for activity intolerance will decrease Outcome: Progressing   Problem: Nutrition: Goal: Adequate nutrition will be maintained Outcome: Progressing   Problem: Coping: Goal: Level of anxiety will decrease Outcome: Progressing   Problem: Elimination: Goal: Will not experience complications related to bowel motility Outcome: Progressing Goal: Will not experience complications related to urinary retention Outcome: Progressing   Problem: Pain Managment: Goal: General experience of comfort will improve Outcome: Progressing   Problem: Safety: Goal: Ability to remain free from injury will improve Outcome: Progressing   Problem: Skin Integrity: Goal: Risk for impaired skin integrity will decrease Outcome: Progressing   Problem: Education: Goal:  Knowledge of the prescribed therapeutic regimen will improve Outcome: Progressing Goal: Ability to verbalize activity precautions or restrictions will improve Outcome: Progressing Goal: Understanding of discharge needs will improve Outcome: Progressing   Problem: Activity: Goal: Ability to perform//tolerate increased activity and mobilize with assistive devices will improve Outcome: Progressing   Problem: Clinical Measurements: Goal: Postoperative complications will be avoided or minimized Outcome: Progressing   Problem: Self-Care: Goal: Ability to meet self-care needs will improve Outcome: Progressing   Problem: Self-Concept: Goal: Ability to maintain and perform role responsibilities to the fullest extent possible will improve Outcome: Progressing   Problem: Pain Management: Goal: Pain level will decrease with appropriate interventions Outcome: Progressing   Problem: Skin Integrity: Goal: Demonstration of wound healing without infection will improve Outcome: Progressing   Problem: Education: Goal: Knowledge of General Education information will improve Description: Including pain rating scale, medication(s)/side effects and non-pharmacologic comfort measures Outcome: Progressing   Problem: Health Behavior/Discharge Planning: Goal: Ability to manage health-related needs will improve Outcome: Progressing   Problem: Clinical Measurements: Goal: Ability to maintain clinical measurements within normal limits will improve Outcome: Progressing Goal: Will remain free from infection Outcome: Progressing Goal: Diagnostic test results will improve Outcome: Progressing Goal: Respiratory complications will improve Outcome: Progressing Goal: Cardiovascular complication will be avoided Outcome: Progressing   Problem: Activity: Goal: Risk for activity intolerance will decrease Outcome: Progressing   Problem: Nutrition: Goal: Adequate nutrition will be maintained Outcome:  Progressing   Problem: Coping: Goal: Level of anxiety will decrease Outcome: Progressing   Problem: Elimination: Goal: Will not experience complications related to bowel motility Outcome: Progressing Goal: Will not experience complications related to urinary retention Outcome: Progressing   Problem: Pain Managment: Goal: General experience of comfort will improve Outcome: Progressing   Problem: Safety: Goal: Ability to remain free from injury will improve Outcome: Progressing   Problem: Skin Integrity: Goal: Risk for impaired skin integrity will decrease Outcome: Progressing

## 2022-12-29 DIAGNOSIS — I96 Gangrene, not elsewhere classified: Secondary | ICD-10-CM | POA: Diagnosis not present

## 2022-12-29 DIAGNOSIS — I10 Essential (primary) hypertension: Secondary | ICD-10-CM | POA: Diagnosis not present

## 2022-12-29 DIAGNOSIS — J449 Chronic obstructive pulmonary disease, unspecified: Secondary | ICD-10-CM | POA: Diagnosis not present

## 2022-12-29 DIAGNOSIS — I5040 Unspecified combined systolic (congestive) and diastolic (congestive) heart failure: Secondary | ICD-10-CM | POA: Diagnosis not present

## 2022-12-29 LAB — GLUCOSE, CAPILLARY
Glucose-Capillary: 167 mg/dL — ABNORMAL HIGH (ref 70–99)
Glucose-Capillary: 115 mg/dL — ABNORMAL HIGH (ref 70–99)
Glucose-Capillary: 130 mg/dL — ABNORMAL HIGH (ref 70–99)
Glucose-Capillary: 131 mg/dL — ABNORMAL HIGH (ref 70–99)

## 2022-12-29 NOTE — Progress Notes (Signed)
[facility]                                                                               [name], is a 57 y.o. male, DOB - February 18, 1966, [id number] Admit date - 12/07/2022    Outpatient Primary MD for the patient is [name], [name], NP  LOS - 22  days    Brief summary   [name] is a 57 y.o. male with PMH chronic combined CHF, CAD, HLD, polyneuropathy, bipolar d/o, COPD, homelessness. Patient presented with left lower extremity chronic venous stasis wound with concern for superimposed infection.  He had been followed outpatient by podiatry as well prior to hospitalization. He was evaluated by orthopedic surgery and was recommended for undergoing left BKA. No new complaints today.  Currently waiting for SNF. Labs unremarkable.  Pt seen and examined today, requesting for motorized wheelchair today.    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  Appreciate Dr Jess Barters recommendations.  Continue with doxycycline to complete the course of 2 weeks.  Pain control and therapy evals.  PAIN Control with neurontin and IV Dilaudid and 20 mg of oxycodone every 4 hours as needed Therapy eval recommending SNF.  Toc on board.    Hypertension:  Blood pressure parameters are well controlled. . Prn hydralazine.  Continue with imdur , amlodipine and lasix.     Chronic combined CHF.  He appears compensated. He denies any sob. He is on RA.   Continue with lasix 40 mg daily.    COPD;  No wheezing heard.    Anxiety:  Started the patient on atarax.   Gerd  Stable on PPI.    Constipation  Continue with senna, colace and Miralax.      Estimated body mass index is 26.47 kg/m as calculated from the following:   Height as of this encounter: 5\' 9"  (1.753 m).   Weight as of this encounter: 81.3 kg.  Code Status: full code.  DVT  Prophylaxis:  SCD's Start: 12/10/22 1731   Level of Care: Level of care: Telemetry Surgical Family Communication: none at bedside.   Disposition Plan:     Remains inpatient appropriate:  homeless   Procedures:  None.   Consultants:   Orthopedics.   Antimicrobials:   Anti-infectives (From admission, onward)    Start     Dose/Rate Route Frequency Ordered Stop   12/26/22 2200  doxycycline (VIBRA-TABS) tablet 100 mg        100 mg Oral Every 12 hours 12/26/22 1307 01/09/23 2159   12/24/22 1000  doxycycline (VIBRA-TABS) tablet 100 mg  Status:  Discontinued        100 mg Oral Every 12 hours 12/24/22 0715 12/26/22 1307   12/10/22 1830  ceFAZolin (ANCEF) IVPB 2g/100 mL premix  Status:  Discontinued        2 g 200 mL/hr over 30 Minutes Intravenous Every 8 hours 12/10/22 1731 12/10/22 1740   12/10/22 1330  ceFAZolin (ANCEF) IVPB 2g/100 mL premix        2 g 200  mL/hr over 30 Minutes Intravenous On call to O.R. 12/10/22 0959 12/10/22 1534   12/08/22 0530  vancomycin (VANCOREADY) IVPB 1500 mg/300 mL        1,500 mg 150 mL/hr over 120 Minutes Intravenous Every 12 hours 12/07/22 1741 12/11/22 0955   12/08/22 0345  metroNIDAZOLE (FLAGYL) IVPB 500 mg        500 mg 100 mL/hr over 60 Minutes Intravenous Every 12 hours 12/07/22 1742 12/12/22 0344   12/07/22 2330  ceFEPIme (MAXIPIME) 2 g in sodium chloride 0.9 % 100 mL IVPB        2 g 200 mL/hr over 30 Minutes Intravenous Every 8 hours 12/07/22 1741 12/11/22 1529   12/07/22 2200  metroNIDAZOLE (FLAGYL) IVPB 500 mg  Status:  Discontinued        500 mg 100 mL/hr over 60 Minutes Intravenous Every 12 hours 12/07/22 2048 12/07/22 2056   12/07/22 1530  ceFEPIme (MAXIPIME) 2 g in sodium chloride 0.9 % 100 mL IVPB        2 g 200 mL/hr over 30 Minutes Intravenous  Once 12/07/22 1516 12/07/22 1734   12/07/22 1530  vancomycin (VANCOREADY) IVPB 1500 mg/300 mL        1,500 mg 150 mL/hr over 120 Minutes Intravenous  Once 12/07/22 1516 12/09/22 0333    12/07/22 1515  metroNIDAZOLE (FLAGYL) IVPB 500 mg        500 mg 100 mL/hr over 60 Minutes Intravenous  Once 12/07/22 1506 12/07/22 1734   12/07/22 1500  ceFAZolin (ANCEF) IVPB 1 g/50 mL premix  Status:  Discontinued        1 g 100 mL/hr over 30 Minutes Intravenous  Once 12/07/22 1452 12/07/22 1513        Medications  Scheduled Meds:  amLODipine  10 mg Oral Daily   vitamin C  1,000 mg Oral Daily   collagenase   Topical Daily   docusate sodium  100 mg Oral Daily   doxycycline  100 mg Oral Q12H   folic acid  1 mg Oral Daily   furosemide  40 mg Oral Daily   gabapentin  900 mg Oral TID   isosorbide mononitrate  15 mg Oral Daily   multivitamin with minerals  1 tablet Oral Daily   nutrition supplement (JUVEN)  1 packet Oral BID BM   pantoprazole  40 mg Oral Daily   polyethylene glycol  17 g Oral Daily   senna-docusate  1 tablet Oral BID   thiamine  100 mg Oral Daily   Continuous Infusions:  magnesium sulfate bolus IVPB     PRN Meds:.albuterol, alum & mag hydroxide-simeth, bisacodyl, guaiFENesin-dextromethorphan, HYDROmorphone (DILAUDID) injection, hydrOXYzine, labetalol, magnesium citrate, magnesium sulfate bolus IVPB, ondansetron, oxyCODONE, phenol, traMADol    Subjective:   [name] was seen and examined today.  Request ing for motorized wheelchair on discharge.  Objective:   Vitals:   12/28/22 2125 12/29/22 0438 12/29/22 0442 12/29/22 0732  BP: (!) 151/90  (!) 133/92 (!) 116/90  Pulse: 86  82 89  Resp: 20  20 17   Temp: 98 F (36.7 C)  98.3 F (36.8 C) 97.9 F (36.6 C)  TempSrc: Oral  Oral   SpO2: 99%  100% 99%  Weight:  81.3 kg    Height:        Intake/Output Summary (Last 24 hours) at 12/29/2022 1253 Last data filed at 12/29/2022 0900 Gross per 24 hour  Intake 840 ml  Output 1275 ml  Net -435 ml  Filed Weights   12/23/22 0453 12/28/22 0500 12/29/22 0438  Weight: 81.3 kg 81.3 kg 81.3 kg     Exam General exam: Appears calm and comfortable   Respiratory system: Clear to auscultation. Respiratory effort normal. Cardiovascular system: S1 & S2 heard, RRR. No JVD, murmurs,  Gastrointestinal system: Abdomen is nondistended, soft and nontender.  Central nervous system: Alert and oriented. No focal neurological deficits. Extremities: left BKA, RIGHT AKA, sutures in place.  Skin: No rashes, lesions or ulcers Psychiatry:  Mood & affect appropriate.       Data Reviewed:  I have personally reviewed following labs and imaging studies   CBC Lab Results  Component Value Date   WBC 6.2 12/27/2022   RBC 4.62 12/27/2022   HGB 14.1 12/27/2022   HCT 41.3 12/27/2022   MCV 89.4 12/27/2022   MCH 30.5 12/27/2022   PLT 398 12/27/2022   MCHC 34.1 12/27/2022   RDW 13.8 12/27/2022   LYMPHSABS 2.1 12/27/2022   MONOABS 0.7 12/27/2022   EOSABS 0.1 12/27/2022   BASOSABS 0.1 17/61/6073     Last metabolic panel Lab Results  Component Value Date   NA 136 12/27/2022   K 3.8 12/27/2022   CL 100 12/27/2022   CO2 23 12/27/2022   BUN 20 12/27/2022   CREATININE 0.88 12/27/2022   GLUCOSE 109 (H) 12/27/2022   GFRNONAA >60 12/27/2022   GFRAA >60 08/21/2020   CALCIUM 9.4 12/27/2022   PHOS 4.7 (H) 07/18/2022   PROT 7.0 12/16/2022   ALBUMIN 3.0 (L) 12/16/2022   BILITOT 0.5 12/16/2022   ALKPHOS 57 12/16/2022   AST 12 (L) 12/16/2022   ALT 12 12/16/2022   ANIONGAP 13 12/27/2022    CBG (last 3)  Recent Labs    12/28/22 1456 12/29/22 0728 12/29/22 1134  GLUCAP 106* 167* 115*       Coagulation Profile: No results for input(s): "INR", "PROTIME" in the last 168 hours.   Radiology Studies: No results found.     Hosie Poisson M.D. [facility] 12/29/2022, 12:53 PM  Available via Epic secure chat 7am-7pm After 7 pm, please refer to night coverage provider listed on amion.

## 2022-12-29 NOTE — TOC Progression Note (Addendum)
Transition of Care [facility]) - Progression Note    Patient Details  Name: [name] MRN: [id number] Date of Birth: 09-Nov-1966  Transition of Care [facility]) CM/SW Contact  Joanne Chars, [name] Phone Number: 12/29/2022, 2:35 PM  Clinical Narrative:  CSW informed by [facility] that they cannot make bed offer at any of their facilities.  TOC leadership has been informed.      Expected Discharge Plan: [place] Barriers to Discharge: Continued Medical Work up, SNF Pending bed offer  Expected Discharge Plan and Services   Discharge Planning Services: CM Consult Post Acute Care Choice: [place] Living arrangements for the past 2 months: Homeless                                       Social Determinants of Health (SDOH) Interventions [place]: Food Insecurity Present (11/24/2022)  Housing: Medium Risk (12/07/2022)  Transportation Needs: Unmet Transportation Needs (12/07/2022)  Utilities: Not At Risk (12/07/2022)  Alcohol Screen: Medium Risk (06/24/2019)  Depression (PHQ2-9): Medium Risk (02/10/2021)  Tobacco Use: High Risk (12/11/2022)    Readmission Risk Interventions    12/09/2022    9:02 AM 12/01/2022    3:56 PM 08/18/2022    9:00 AM  Readmission Risk Prevention Plan  Transportation Screening Complete Complete Complete  Medication Review Press photographer) Complete Complete Complete  PCP or Specialist appointment within 3-5 days of discharge Complete Complete Complete  HRI or Home Care Consult Complete Complete Complete  SW Recovery Care/Counseling Consult Complete Complete Complete  Palliative Care Screening Not Applicable Not Applicable Not [place] Not Applicable Not Applicable Not Applicable  SNF Comments  n/a

## 2022-12-29 NOTE — Plan of Care (Signed)
  Problem: Fluid Volume: Goal: Hemodynamic stability will improve Outcome: Progressing   Problem: Clinical Measurements: Goal: Diagnostic test results will improve Outcome: Progressing Goal: Signs and symptoms of infection will decrease Outcome: Progressing   Problem: Respiratory: Goal: Ability to maintain adequate ventilation will improve Outcome: Progressing   Problem: Education: Goal: Knowledge of General Education information will improve Description: Including pain rating scale, medication(s)/side effects and non-pharmacologic comfort measures Outcome: Progressing   Problem: Health Behavior/Discharge Planning: Goal: Ability to manage health-related needs will improve Outcome: Progressing   Problem: Clinical Measurements: Goal: Ability to maintain clinical measurements within normal limits will improve Outcome: Progressing Goal: Will remain free from infection Outcome: Progressing Goal: Diagnostic test results will improve Outcome: Progressing Goal: Respiratory complications will improve Outcome: Progressing Goal: Cardiovascular complication will be avoided Outcome: Progressing   Problem: Activity: Goal: Risk for activity intolerance will decrease Outcome: Progressing   Problem: Nutrition: Goal: Adequate nutrition will be maintained Outcome: Progressing   Problem: Coping: Goal: Level of anxiety will decrease Outcome: Progressing   Problem: Elimination: Goal: Will not experience complications related to bowel motility Outcome: Progressing Goal: Will not experience complications related to urinary retention Outcome: Progressing   Problem: Pain Managment: Goal: General experience of comfort will improve Outcome: Progressing   Problem: Safety: Goal: Ability to remain free from injury will improve Outcome: Progressing   Problem: Skin Integrity: Goal: Risk for impaired skin integrity will decrease Outcome: Progressing   Problem: Education: Goal:  Knowledge of the prescribed therapeutic regimen will improve Outcome: Progressing Goal: Ability to verbalize activity precautions or restrictions will improve Outcome: Progressing Goal: Understanding of discharge needs will improve Outcome: Progressing   Problem: Activity: Goal: Ability to perform//tolerate increased activity and mobilize with assistive devices will improve Outcome: Progressing   Problem: Clinical Measurements: Goal: Postoperative complications will be avoided or minimized Outcome: Progressing   Problem: Self-Care: Goal: Ability to meet self-care needs will improve Outcome: Progressing   Problem: Self-Concept: Goal: Ability to maintain and perform role responsibilities to the fullest extent possible will improve Outcome: Progressing   Problem: Pain Management: Goal: Pain level will decrease with appropriate interventions Outcome: Progressing   Problem: Skin Integrity: Goal: Demonstration of wound healing without infection will improve Outcome: Progressing   Problem: Education: Goal: Knowledge of General Education information will improve Description: Including pain rating scale, medication(s)/side effects and non-pharmacologic comfort measures Outcome: Progressing   Problem: Health Behavior/Discharge Planning: Goal: Ability to manage health-related needs will improve Outcome: Progressing   Problem: Clinical Measurements: Goal: Ability to maintain clinical measurements within normal limits will improve Outcome: Progressing Goal: Will remain free from infection Outcome: Progressing Goal: Diagnostic test results will improve Outcome: Progressing Goal: Respiratory complications will improve Outcome: Progressing Goal: Cardiovascular complication will be avoided Outcome: Progressing   Problem: Activity: Goal: Risk for activity intolerance will decrease Outcome: Progressing   Problem: Nutrition: Goal: Adequate nutrition will be maintained Outcome:  Progressing   Problem: Coping: Goal: Level of anxiety will decrease Outcome: Progressing   Problem: Elimination: Goal: Will not experience complications related to bowel motility Outcome: Progressing Goal: Will not experience complications related to urinary retention Outcome: Progressing   Problem: Pain Managment: Goal: General experience of comfort will improve Outcome: Progressing   Problem: Safety: Goal: Ability to remain free from injury will improve Outcome: Progressing   Problem: Skin Integrity: Goal: Risk for impaired skin integrity will decrease Outcome: Progressing   

## 2022-12-29 NOTE — Progress Notes (Signed)
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 PMH includes asthma, bioplar 1, depression, schizoaffective disorder, CVA, chronic CHF, homelessness, HTN, DM2, cocaine and cannabis use, and R AKA.    PT Comments    Pt progressing with post-op mobility. He is able to demonstrate extended distance wheelchair mobility with modified independence, but still requires assist for transfers to/from wheelchair. Recommend A<>P transfers, however pt declining to try as he does not like his residual limb sliding across the bed and prefers to have it dangling. Pt is likely to be more successful with A<>P method when pain is improved. Will keep pt on at a 1x/week frequency to work on transfer training, however feel he has more OT needs from a therapy standpoint and will benefit from continued hallway mobility with the mobility specialist team.     Recommendations for follow up therapy are one component of a multi-disciplinary discharge planning process, led by the attending physician.  Recommendations may be updated based on patient status, additional functional criteria and insurance authorization.  Follow Up Recommendations  Skilled nursing-short term rehab (<3 hours/day) Can patient physically be transported by private vehicle: No   Assistance Recommended at Discharge Frequent or constant Supervision/Assistance  Patient can return home with the following Assistance with cooking/housework;A little help with bathing/dressing/bathroom;A little help with walking and/or transfers;Assist for transportation;Help with stairs or ramp for entrance   Equipment Recommendations  Other (comment) (Recommending L elevating leg rest for wheelchair but pt refusing it)    Recommendations for Other Services       Precautions /  Restrictions Precautions Precautions: Fall Precaution Comments: R AKA, L BKA Restrictions Weight Bearing Restrictions: Yes LLE Weight Bearing: Non weight bearing     Mobility  Bed Mobility Overal bed mobility: Modified Independent Bed Mobility: Rolling, Supine to Sit Rolling: Modified independent (Device/Increase time)   Supine to sit: Modified independent (Device/Increase time)     General bed mobility comments: Pt able to transition to EOB without assistance and scoot out to sit with L residual limb dangling.    Transfers Overall transfer level: Needs assistance Equipment used: None Transfers: Bed to chair/wheelchair/BSC            Lateral/Scoot Transfers: Mod assist, Min guard General transfer comment: Pt declining to try A<>P transfers at this time. States it hurts his residual limb to be resting on a surface as he scoots. Pt was able to complete lateral transfer to w/c with min guard assist and stabilization of the wheelchair, however required assistance to transfer back to the bed at end of session. Gait belt utilized to assist with lifting pt over the wheel of the chair and pt was able to complete the rest of the transfer without assist.    Ambulation/Gait               General Gait Details: unable   Theme park manager mobility: Yes Wheelchair propulsion: Both upper extremities Wheelchair parts: Independent (arm rests; wheelchair does not have leg rests) Distance: 550  Modified Rankin (Stroke Patients Only)       Balance Overall balance assessment: Needs assistance Sitting-balance support: Feet unsupported, Single extremity supported Sitting balance-Leahy Scale: Fair  Cognition Arousal/Alertness: Awake/alert Behavior During Therapy: WFL for tasks assessed/performed Overall Cognitive Status: No family/caregiver present to determine  baseline cognitive functioning Area of Impairment: Safety/judgement, Awareness, Problem solving, Attention, Memory                   Current Attention Level: Sustained Memory: Decreased recall of precautions, Decreased short-term memory Following Commands: Follows one step commands consistently Safety/Judgement: Decreased awareness of deficits, Decreased awareness of safety Awareness: Emergent Problem Solving: Slow processing, Requires verbal cues, Requires tactile cues, Difficulty sequencing          Exercises      General Comments        Pertinent Vitals/Pain Pain Assessment Pain Assessment: Faces Faces Pain Scale: Hurts a little bit Pain Location: left residual limb Pain Descriptors / Indicators: Operative site guarding, Grimacing, Sore Pain Intervention(s): Limited activity within patient's tolerance, Monitored during session, Repositioned    Home Living                          Prior Function            PT Goals (current goals can now be found in the care plan section) Acute Rehab PT Goals Patient Stated Goal: decreased pain PT Goal Formulation: With patient Time For Goal Achievement: 01/12/23 Potential to Achieve Goals: Good Progress towards PT goals: Progressing toward goals    Frequency    Min 1X/week      PT Plan Current plan remains appropriate    Co-evaluation              AM-PAC PT "6 Clicks" Mobility   Outcome Measure  Help needed turning from your back to your side while in a flat bed without using bedrails?: None Help needed moving from lying on your back to sitting on the side of a flat bed without using bedrails?: None Help needed moving to and from a bed to a chair (including a wheelchair)?: A Little Help needed standing up from a chair using your arms (e.g., wheelchair or bedside chair)?: Total Help needed to walk in hospital room?: Total Help needed climbing 3-5 steps with a railing? : Total 6 Click Score:  14    End of Session Equipment Utilized During Treatment: Gait belt Activity Tolerance: Patient tolerated treatment well Patient left: with call bell/phone within reach;in bed (EOB) Nurse Communication: Mobility status PT Visit Diagnosis: Muscle weakness (generalized) (M62.81);Difficulty in walking, not elsewhere classified (R26.2);Other abnormalities of gait and mobility (R26.89)     Time: 6759-1638 PT Time Calculation (min) (ACUTE ONLY): 49 min  Charges:  $Therapeutic Activity: 8-22 mins $Wheel Chair Management: 23-37 mins                     Rolinda Roan, PT, DPT Acute Rehabilitation Services Secure Chat Preferred Office: [phone]    [name] 12/29/2022, 10:57 AM

## 2022-12-29 NOTE — Progress Notes (Signed)
Mobility Specialist Progress Note   12/29/22 1500  Mobility  Activity  (Propelled in hallway)  Level of Assistance Minimal assist, patient does 75% or more  Assistive Device Wheelchair  Distance Ambulated (ft)  (propelled 576ft*)  LLE Weight Bearing NWB  Activity Response Tolerated well  Mobility Referral Yes  $Mobility charge 1 Mobility   Received pt in bed c/o pain in LLE but agreeable after receiving meds. AP transfer w/ minA to steady w/c. Pt able to propel self in hallway w/o fault + intermittently had pt doing seated pushups every 52ft. Pt slightly fatigued at the end of session but able to make it all the way down the hallway. Pushed back to room where pt AP transferred back to bed w/ minA to steady w/c. Left w/ call bell in reach and needs met.    Holland Falling Mobility Specialist Please contact via SecureChat or  Rehab office at [phone]

## 2022-12-30 DIAGNOSIS — I5040 Unspecified combined systolic (congestive) and diastolic (congestive) heart failure: Secondary | ICD-10-CM | POA: Diagnosis not present

## 2022-12-30 DIAGNOSIS — I96 Gangrene, not elsewhere classified: Secondary | ICD-10-CM | POA: Diagnosis not present

## 2022-12-30 DIAGNOSIS — I10 Essential (primary) hypertension: Secondary | ICD-10-CM | POA: Diagnosis not present

## 2022-12-30 DIAGNOSIS — J449 Chronic obstructive pulmonary disease, unspecified: Secondary | ICD-10-CM | POA: Diagnosis not present

## 2022-12-30 LAB — GLUCOSE, CAPILLARY
Glucose-Capillary: 90 mg/dL (ref 70–99)
Glucose-Capillary: 138 mg/dL — ABNORMAL HIGH (ref 70–99)
Glucose-Capillary: 157 mg/dL — ABNORMAL HIGH (ref 70–99)

## 2022-12-30 NOTE — Progress Notes (Signed)
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 PMH includes asthma, bioplar 1, depression, schizoaffective disorder, CVA, chronic CHF, homelessness, HTN, DM2, cocaine and cannabis use, and R AKA.   OT comments  Pt in bed upon therapy arrival. Agreeable to participate in OT treatment session. Session focused on manual techniques, LLE ROM, and patient education regarding HEP and residual limb healing process. Pt reports that using ice only helps for a few minutes when used for pain management. Pt tolerated myofascial release to the left residual limb and knee with min-mod fascial restrictions palpated. Min pressure used during manual techniques. Pt education provided on completing knee extension/flexion and seated leg raises while seated EOB in addition to laying prone for a short amount of time to stretch his left hip and knee. Pt verbalized and demonstrated understanding.    Recommendations for follow up therapy are one component of a multi-disciplinary discharge planning process, led by the attending physician.  Recommendations may be updated based on patient status, additional functional criteria and insurance authorization.    Follow Up Recommendations  Skilled nursing-short term rehab (<3 hours/day)     Assistance Recommended at Discharge PRN  Patient can return home with the following  A little help with walking and/or transfers;A little help with bathing/dressing/bathroom;Help with stairs or ramp for entrance;Assist for transportation   Equipment Recommendations  Other (comment) (defer to next venue)       Precautions / Restrictions Precautions Precautions: Fall Precaution Comments: R AKA, L BKA Restrictions Weight Bearing Restrictions: Yes LLE Weight Bearing: Non weight bearing Other  Position/Activity Restrictions: L wound vac       Mobility Bed Mobility Overal bed mobility: Modified Independent       Balance Overall balance assessment: Needs assistance Sitting-balance support: Feet unsupported, Single extremity supported Sitting balance-Leahy Scale: Good Sitting balance - Comments: sitting on EOB       ADL either performed or assessed with clinical judgement      Cognition Arousal/Alertness: Awake/alert Behavior During Therapy: WFL for tasks assessed/performed Overall Cognitive Status: No family/caregiver present to determine baseline cognitive functioning     Memory: Decreased recall of precautions, Decreased short-term memory Following Commands: Follows one step commands consistently          Exercises Other Exercises Other Exercises: Myofascial release completed to left residual limb and knee to decrease fascial restrictions and increase joint mobility in a pain free zone. Other Exercises: Left knee extension/flexion, 5x3" in extension completed, seated EOB Other Exercises: Left hip and knee extension dynamic stretching completed prone in bed, 2X.            Pertinent Vitals/ Pain       Pain Assessment Pain Assessment: Faces Faces Pain Scale: Hurts little more Pain Location: left residual limb Pain Descriptors / Indicators: Operative site guarding, Grimacing, Sore Pain Intervention(s): Monitored during session         Frequency  Min 2X/week        Progress Toward Goals  OT Goals(current goals can now be found in the care plan section)  Progress towards OT goals: Progressing toward goals     Plan Discharge plan remains appropriate;Frequency remains appropriate       AM-PAC OT "6 Clicks" Daily Activity     Outcome Measure   Help from another person eating meals?: None  Help from another person taking care of personal grooming?: None Help from another person toileting, which includes using toliet, bedpan, or urinal?: A  Little Help from another person bathing (including washing, rinsing, drying)?: A Little Help from another person to put on and taking off regular upper body clothing?: None Help from another person to put on and taking off regular lower body clothing?: A Little 6 Click Score: 21    End of Session    OT Visit Diagnosis: Muscle weakness (generalized) (M62.81);Pain Pain - Right/Left: Left Pain - part of body: Leg   Activity Tolerance Patient tolerated treatment well   Patient Left in bed;with call bell/phone within reach           Time: 1347-1416 OT Time Calculation (min): 29 min  Charges: OT General Charges $OT Visit: 1 Visit OT Treatments $Therapeutic Exercise: 8-22 mins $Massage: 8-22 mins  Ailene Ravel, OTR/L,CBIS  Supplemental OT - [facility] Secure Chat Preferred    Oneda Duffett, [name] 12/30/2022, 2:23 PM

## 2022-12-30 NOTE — Progress Notes (Signed)
Mobility Specialist Progress Note   12/30/22 1544  Mobility  Activity  (Propelled in hallway)  Level of Assistance Minimal assist, patient does 75% or more  Assistive Device Wheelchair  Distance Ambulated (ft)  (Propelled 569ft)  LLE Weight Bearing NWB  Activity Response Tolerated well  Mobility Referral Yes  $Mobility charge 1 Mobility   Received pt in bed having no complaints and agreeable. Propelled in hallway w/ obstacles in place to work on maneuvering in w/c, pt having no faults. Returned back to room and stabilized w/c w/ minA while pt AP transferred back to bed w/o incident. Call bell placed in reach and all needs met.  Holland Falling Mobility Specialist Please contact via SecureChat or  Rehab office at [phone]

## 2022-12-30 NOTE — Progress Notes (Signed)
[facility]                                                                               [name], is a 57 y.o. male, DOB - 1966-06-07, GYJ:[id number] Admit date - 12/07/2022    Outpatient Primary MD for the patient is [name], [name], NP  LOS - 23  days    Brief summary   [name] is a 57 y.o. male with PMH chronic combined CHF, CAD, HLD, polyneuropathy, bipolar d/o, COPD, homelessness. Patient presented with left lower extremity chronic venous stasis wound with concern for superimposed infection.  He had been followed outpatient by podiatry as well prior to hospitalization. He was evaluated by orthopedic surgery and was recommended for undergoing left BKA. No new complaints today.  Currently waiting for SNF. Labs unremarkable.  Pt seen and examined today, requesting for motorized wheelchair on discharge. No new complaints   Assessment & Plan    Assessment and Plan:  Sepsis due to left foot gangrene Non healing left foot wound, failed conservative measures.  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  Appreciate Dr Jess Barters recommendations.  Continue with doxycycline to complete the course of 2 weeks.  Pain control and therapy evals.  PAIN Control with neurontin and IV Dilaudid and 20 mg of oxycodone every 4 hours as needed Therapy eval recommending SNF.  Toc on board.    Hypertension:  Blood pressure parameters are optimal. Prn hydralazine.  Continue with imdur , amlodipine and lasix.     Chronic combined CHF.  He appears compensated. He denies any sob. He is on RA.   Continue with lasix 40 mg daily.    COPD;  No wheezing heard.    Anxiety:  Started the patient on atarax.   Gerd  Stable on PPI.    Constipation  Continue with senna, colace and Miralax.      Estimated body mass index is 26.47 kg/m as calculated from the following:   Height as of this encounter: 5\' 9"  (1.753 m).   Weight as of this encounter: 81.3 kg.  Code Status: full code.   DVT Prophylaxis:  SCD's Start: 12/10/22 1731   Level of Care: Level of care: Telemetry Surgical Family Communication: none at bedside.   Disposition Plan:     Remains inpatient appropriate:  homeless , Toc ON BOARD.   Procedures:  None.   Consultants:   Orthopedics.   Antimicrobials:   Anti-infectives (From admission, onward)    Start     Dose/Rate Route Frequency Ordered Stop   12/26/22 2200  doxycycline (VIBRA-TABS) tablet 100 mg        100 mg Oral Every 12 hours 12/26/22 1307 01/09/23 2159   12/24/22 1000  doxycycline (VIBRA-TABS) tablet 100 mg  Status:  Discontinued        100 mg Oral Every 12 hours 12/24/22 0715 12/26/22 1307   12/10/22 1830  ceFAZolin (ANCEF) IVPB 2g/100 mL premix  Status:  Discontinued        2 g 200 mL/hr over 30 Minutes Intravenous Every 8 hours 12/10/22 1731 12/10/22 1740   12/10/22 1330  ceFAZolin (ANCEF) IVPB 2g/100 mL premix  2 g 200 mL/hr over 30 Minutes Intravenous On call to O.R. 12/10/22 0959 12/10/22 1534   12/08/22 0530  vancomycin (VANCOREADY) IVPB 1500 mg/300 mL        1,500 mg 150 mL/hr over 120 Minutes Intravenous Every 12 hours 12/07/22 1741 12/11/22 0955   12/08/22 0345  metroNIDAZOLE (FLAGYL) IVPB 500 mg        500 mg 100 mL/hr over 60 Minutes Intravenous Every 12 hours 12/07/22 1742 12/12/22 0344   12/07/22 2330  ceFEPIme (MAXIPIME) 2 g in sodium chloride 0.9 % 100 mL IVPB        2 g 200 mL/hr over 30 Minutes Intravenous Every 8 hours 12/07/22 1741 12/11/22 1529   12/07/22 2200  metroNIDAZOLE (FLAGYL) IVPB 500 mg  Status:  Discontinued        500 mg 100 mL/hr over 60 Minutes Intravenous Every 12 hours 12/07/22 2048 12/07/22 2056   12/07/22 1530  ceFEPIme (MAXIPIME) 2 g in sodium chloride 0.9 % 100 mL IVPB        2 g 200 mL/hr over 30 Minutes Intravenous  Once 12/07/22 1516 12/07/22 1734   12/07/22 1530  vancomycin (VANCOREADY) IVPB 1500 mg/300 mL        1,500 mg 150 mL/hr over 120 Minutes Intravenous  Once 12/07/22 1516  12/09/22 0333   12/07/22 1515  metroNIDAZOLE (FLAGYL) IVPB 500 mg        500 mg 100 mL/hr over 60 Minutes Intravenous  Once 12/07/22 1506 12/07/22 1734   12/07/22 1500  ceFAZolin (ANCEF) IVPB 1 g/50 mL premix  Status:  Discontinued        1 g 100 mL/hr over 30 Minutes Intravenous  Once 12/07/22 1452 12/07/22 1513        Medications  Scheduled Meds:  amLODipine  10 mg Oral Daily   vitamin C  1,000 mg Oral Daily   collagenase   Topical Daily   docusate sodium  100 mg Oral Daily   doxycycline  100 mg Oral Z61W   folic acid  1 mg Oral Daily   furosemide  40 mg Oral Daily   gabapentin  900 mg Oral TID   isosorbide mononitrate  15 mg Oral Daily   multivitamin with minerals  1 tablet Oral Daily   nutrition supplement (JUVEN)  1 packet Oral BID BM   pantoprazole  40 mg Oral Daily   polyethylene glycol  17 g Oral Daily   senna-docusate  1 tablet Oral BID   thiamine  100 mg Oral Daily   Continuous Infusions:  magnesium sulfate bolus IVPB     PRN Meds:.albuterol, alum & mag hydroxide-simeth, bisacodyl, guaiFENesin-dextromethorphan, HYDROmorphone (DILAUDID) injection, hydrOXYzine, labetalol, magnesium citrate, magnesium sulfate bolus IVPB, ondansetron, oxyCODONE, phenol, traMADol    Subjective:   [name] was seen and examined today.  Requesting pain meds.  Objective:   Vitals:   12/29/22 0732 12/29/22 1531 12/29/22 1925 12/30/22 0703  BP: (!) 116/90 (!) 142/92 (!) 154/96 125/88  Pulse: 89 86 85 96  Resp: 17 17 18    Temp: 97.9 F (36.6 C) 97.8 F (36.6 C) 98.5 F (36.9 C) 98.4 F (36.9 C)  TempSrc:  Oral Oral Oral  SpO2: 99% 97% 98% 98%  Weight:      Height:        Intake/Output Summary (Last 24 hours) at 12/30/2022 1256 Last data filed at 12/29/2022 1700 Gross per 24 hour  Intake 720 ml  Output 800 ml  Net -80 ml  Filed Weights   12/23/22 0453 12/28/22 0500 12/29/22 0438  Weight: 81.3 kg 81.3 kg 81.3 kg     Exam General exam: Appears calm and  comfortable  Respiratory system: Clear to auscultation. Respiratory effort normal. Cardiovascular system: S1 & S2 heard, RRR. No JVD, murmurs. No pedal edema. Gastrointestinal system: Abdomen is nondistended, soft and nontender. Normal bowel sounds heard. Central nervous system: Alert and oriented. No focal neurological deficits. Extremities: Left BKA, right AKA, sutures in place and no signs of infection.  Skin: No rashes, . Psychiatry:  Mood & affect appropriate.      Data Reviewed:  I have personally reviewed following labs and imaging studies   CBC Lab Results  Component Value Date   WBC 6.2 12/27/2022   RBC 4.62 12/27/2022   HGB 14.1 12/27/2022   HCT 41.3 12/27/2022   MCV 89.4 12/27/2022   MCH 30.5 12/27/2022   PLT 398 12/27/2022   MCHC 34.1 12/27/2022   RDW 13.8 12/27/2022   LYMPHSABS 2.1 12/27/2022   MONOABS 0.7 12/27/2022   EOSABS 0.1 12/27/2022   BASOSABS 0.1 57/84/6962     Last metabolic panel Lab Results  Component Value Date   NA 136 12/27/2022   K 3.8 12/27/2022   CL 100 12/27/2022   CO2 23 12/27/2022   BUN 20 12/27/2022   CREATININE 0.88 12/27/2022   GLUCOSE 109 (H) 12/27/2022   GFRNONAA >60 12/27/2022   GFRAA >60 08/21/2020   CALCIUM 9.4 12/27/2022   PHOS 4.7 (H) 07/18/2022   PROT 7.0 12/16/2022   ALBUMIN 3.0 (L) 12/16/2022   BILITOT 0.5 12/16/2022   ALKPHOS 57 12/16/2022   AST 12 (L) 12/16/2022   ALT 12 12/16/2022   ANIONGAP 13 12/27/2022    CBG (last 3)  Recent Labs    12/29/22 1928 12/30/22 0705 12/30/22 1216  GLUCAP 131* 90 138*       Coagulation Profile: No results for input(s): "INR", "PROTIME" in the last 168 hours.   Radiology Studies: No results found.     Hosie Poisson M.D. [facility] 12/30/2022, 12:56 PM  Available via Epic secure chat 7am-7pm After 7 pm, please refer to night coverage provider listed on amion.

## 2022-12-30 NOTE — Progress Notes (Signed)
Responded to consult for IV. No appropriate veins in L arm via [place] assessment. Pt refuses assessment of R arm.

## 2022-12-30 NOTE — Plan of Care (Signed)

## 2022-12-31 DIAGNOSIS — I10 Essential (primary) hypertension: Secondary | ICD-10-CM | POA: Diagnosis not present

## 2022-12-31 DIAGNOSIS — S81809A Unspecified open wound, unspecified lower leg, initial encounter: Secondary | ICD-10-CM

## 2022-12-31 DIAGNOSIS — A419 Sepsis, unspecified organism: Secondary | ICD-10-CM

## 2022-12-31 DIAGNOSIS — I96 Gangrene, not elsewhere classified: Secondary | ICD-10-CM | POA: Diagnosis not present

## 2022-12-31 DIAGNOSIS — L0390 Cellulitis, unspecified: Secondary | ICD-10-CM | POA: Diagnosis not present

## 2022-12-31 LAB — GLUCOSE, CAPILLARY
Glucose-Capillary: 144 mg/dL — ABNORMAL HIGH (ref 70–99)
Glucose-Capillary: 84 mg/dL (ref 70–99)
Glucose-Capillary: 109 mg/dL — ABNORMAL HIGH (ref 70–99)

## 2022-12-31 NOTE — Progress Notes (Signed)
Mobility Specialist Progress Note   12/31/22 1345  Mobility  Activity  (Propelled 1100)  Level of Assistance Minimal assist, patient does 75% or more  Assistive Device Wheelchair  Distance Ambulated (ft)  (Propelled 1167ft)  LLE Weight Bearing NWB  Activity Response Tolerated well  Mobility Referral Yes  $Mobility charge 1 Mobility   Received pt in sitting EOB requesting assistance to go propelling in hallway with w/c. MinA to steady w/c while pt AP transferred to w/c. No faults in hallway. Returned back to room where pt AP transferred back to bed. Left w/ call bell in reach and all needs met.  Holland Falling Mobility Specialist Please contact via SecureChat or  Rehab office at [phone]

## 2022-12-31 NOTE — Progress Notes (Signed)
Pt is very agitated about not getting an IV for IV dilaudid. IV team has consulted and attempted multiple times to assess and stick the pt but was unsuccessful. Per IV team, no appropriate veins in Lt arm via [place] assessment, Pt refuses assessment of Rt arm. Pt also educated and explained about proper pain management but is resistant to education. Per pt, 'I want my IV dilaudid!' even though RN offered him oxycodone p.o. Pt eventually took oxycodone when IV team was not able to place an IV. Charge Nurse made aware. Clarene Essex notified of the situation.

## 2022-12-31 NOTE — Plan of Care (Signed)
  Problem: Fluid Volume: Goal: Hemodynamic stability will improve Outcome: Not Progressing   Problem: Clinical Measurements: Goal: Diagnostic test results will improve Outcome: Not Progressing Goal: Signs and symptoms of infection will decrease Outcome: Not Progressing   Problem: Respiratory: Goal: Ability to maintain adequate ventilation will improve Outcome: Not Progressing   Problem: Education: Goal: Knowledge of General Education information will improve Description: Including pain rating scale, medication(s)/side effects and non-pharmacologic comfort measures Outcome: Not Progressing   Problem: Health Behavior/Discharge Planning: Goal: Ability to manage health-related needs will improve Outcome: Not Progressing   Problem: Clinical Measurements: Goal: Ability to maintain clinical measurements within normal limits will improve Outcome: Not Progressing Goal: Will remain free from infection Outcome: Not Progressing Goal: Diagnostic test results will improve Outcome: Not Progressing Goal: Respiratory complications will improve Outcome: Not Progressing Goal: Cardiovascular complication will be avoided Outcome: Not Progressing   Problem: Nutrition: Goal: Adequate nutrition will be maintained Outcome: Not Progressing   Problem: Coping: Goal: Level of anxiety will decrease Outcome: Not Progressing   Problem: Elimination: Goal: Will not experience complications related to bowel motility Outcome: Not Progressing Goal: Will not experience complications related to urinary retention Outcome: Not Progressing

## 2022-12-31 NOTE — Progress Notes (Signed)
0537am: RN offered oxycodone 20 mg p.o to the pt, patient verbalized "just wait in the morning." RN then encouraged pt to take pain medicine when in pain and while IV is not available. Pt then yelled out to the RN, "I only want IV dilaudid! Do not try to make an argument.Leave me alone!"

## 2022-12-31 NOTE — Progress Notes (Signed)
[facility]  PROGRESS NOTE  [name] PYK:[id number] DOB: 02-May-1966 DOA: 12/07/2022 PCP: Marliss Coots, NP   Brief HPI:   57 year old male with past medical history of chronic combined CHF, CAD, hyperlipidemia, polyneuropathy, bipolar disorder, COPD, homelessness presented with lower extremity chronic venous stasis wound with concern for superimposed infection.  He was seen by orthopedic surgery and was recommended for ongoing left BKA.  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  Currently awaiting skilled nursing facility placement.    Subjective   Patient seen and examined, complains of pain.  Lost IV access.   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 Sharol Given -Started on doxycycline for 2 weeks -PT consulted, recommended skilled nursing facility; [facility] consulted -Pain control with Neurontin,  20 mg of oxycodone every 4 hours as needed  Hypertension -Blood pressure is well-controlled -Continue as needed hydralazine -Continue Imdur, amlodipine, Lasix  Chronic combined CHF -Appears well compensated -Continue Lasix 40 mg p.o. daily  COPD -Stable  Anxiety -Continue Atarax as needed  GERD -Continue PPI  Constipation -Continue senna, Colace, MiraLAX    Medications     amLODipine  10 mg Oral Daily   vitamin C  1,000 mg Oral Daily   collagenase   Topical Daily   docusate sodium  100 mg Oral Daily   doxycycline  100 mg Oral [id number]   folic acid  1 mg Oral Daily   furosemide  40 mg Oral Daily   gabapentin  900 mg Oral TID   isosorbide mononitrate  15 mg Oral Daily   multivitamin with minerals  1 tablet Oral Daily   nutrition supplement (JUVEN)  1 packet Oral BID BM   pantoprazole  40 mg Oral Daily   polyethylene glycol  17 g Oral Daily   senna-docusate  1 tablet Oral BID   thiamine  100 mg Oral Daily     Data  Reviewed:   CBG:  Recent Labs  Lab 12/29/22 1638 12/29/22 1928 12/30/22 0705 12/30/22 1216 12/30/22 1626  GLUCAP 130* 131* 90 138* 157*    SpO2: 99 % O2 Flow Rate (L/min): 2 L/min    Vitals:   12/29/22 1925 12/30/22 0703 12/30/22 1440 12/30/22 2014  BP: (!) 154/96 125/88 116/74 (!) 154/97  Pulse: 85 96 83 83  Resp: 18   16  Temp: 98.5 F (36.9 C) 98.4 F (36.9 C) 98.2 F (36.8 C) 98.1 F (36.7 C)  TempSrc: Oral Oral Oral Oral  SpO2: 98% 98% 99% 99%  Weight:      Height:          Data Reviewed:  Basic Metabolic Panel: Recent Labs  Lab 12/27/22 1109  NA 136  K 3.8  CL 100  CO2 23  GLUCOSE 109*  BUN 20  CREATININE 0.88  CALCIUM 9.4    CBC: Recent Labs  Lab 12/27/22 1109  WBC 6.2  NEUTROABS 3.2  HGB 14.1  HCT 41.3  MCV 89.4  PLT 398    LFT No results for input(s): "AST", "ALT", "ALKPHOS", "BILITOT", "PROT", "ALBUMIN" in the last 168 hours.   Antibiotics: Anti-infectives (From admission, onward)    Start     Dose/Rate Route Frequency Ordered Stop   12/26/22 2200  doxycycline (VIBRA-TABS) tablet 100 mg        100 mg Oral Every 12 hours 12/26/22 1307 01/09/23 2159   12/24/22 1000  doxycycline (  VIBRA-TABS) tablet 100 mg  Status:  Discontinued        100 mg Oral Every 12 hours 12/24/22 0715 12/26/22 1307   12/10/22 1830  ceFAZolin (ANCEF) IVPB 2g/100 mL premix  Status:  Discontinued        2 g 200 mL/hr over 30 Minutes Intravenous Every 8 hours 12/10/22 1731 12/10/22 1740   12/10/22 1330  ceFAZolin (ANCEF) IVPB 2g/100 mL premix        2 g 200 mL/hr over 30 Minutes Intravenous On call to O.R. 12/10/22 0959 12/10/22 1534   12/08/22 0530  vancomycin (VANCOREADY) IVPB 1500 mg/300 mL        1,500 mg 150 mL/hr over 120 Minutes Intravenous Every 12 hours 12/07/22 1741 12/11/22 0955   12/08/22 0345  metroNIDAZOLE (FLAGYL) IVPB 500 mg        500 mg 100 mL/hr over 60 Minutes Intravenous Every 12 hours 12/07/22 1742 12/12/22 0344   12/07/22 2330   ceFEPIme (MAXIPIME) 2 g in sodium chloride 0.9 % 100 mL IVPB        2 g 200 mL/hr over 30 Minutes Intravenous Every 8 hours 12/07/22 1741 12/11/22 1529   12/07/22 2200  metroNIDAZOLE (FLAGYL) IVPB 500 mg  Status:  Discontinued        500 mg 100 mL/hr over 60 Minutes Intravenous Every 12 hours 12/07/22 2048 12/07/22 2056   12/07/22 1530  ceFEPIme (MAXIPIME) 2 g in sodium chloride 0.9 % 100 mL IVPB        2 g 200 mL/hr over 30 Minutes Intravenous  Once 12/07/22 1516 12/07/22 1734   12/07/22 1530  vancomycin (VANCOREADY) IVPB 1500 mg/300 mL        1,500 mg 150 mL/hr over 120 Minutes Intravenous  Once 12/07/22 1516 12/09/22 0333   12/07/22 1515  metroNIDAZOLE (FLAGYL) IVPB 500 mg        500 mg 100 mL/hr over 60 Minutes Intravenous  Once 12/07/22 1506 12/07/22 1734   12/07/22 1500  ceFAZolin (ANCEF) IVPB 1 g/50 mL premix  Status:  Discontinued        1 g 100 mL/hr over 30 Minutes Intravenous  Once 12/07/22 1452 12/07/22 1513        DVT prophylaxis: SCDs  Code Status: Full code  Family Communication: No family at bedside   CONSULTS orthopedics   Objective    Physical Examination:   General: Appears in no acute distress Cardiovascular: S1-S2, regular, no murmur auscultated Respiratory: Lungs clear to auscultation bilaterally Abdomen: Abdomen is soft, nontender, no organomegaly Extremities: Bilateral BKA Neurologic: Alert, oriented x 3, no focal deficit noted   Status is: Inpatient:             [place]   [facility] If 7PM-7AM, please contact night-coverage at [email or web address], Office  [phone]   12/31/2022, 8:11 AM  LOS: 24 days

## 2023-01-01 DIAGNOSIS — I96 Gangrene, not elsewhere classified: Secondary | ICD-10-CM | POA: Diagnosis not present

## 2023-01-01 DIAGNOSIS — I10 Essential (primary) hypertension: Secondary | ICD-10-CM | POA: Diagnosis not present

## 2023-01-01 DIAGNOSIS — S81809A Unspecified open wound, unspecified lower leg, initial encounter: Secondary | ICD-10-CM | POA: Diagnosis not present

## 2023-01-01 DIAGNOSIS — L0390 Cellulitis, unspecified: Secondary | ICD-10-CM | POA: Diagnosis not present

## 2023-01-01 NOTE — TOC Progression Note (Addendum)
Transition of Care [facility]) - Progression Note    Patient Details  Name: [name] MRN: [id number] Date of Birth: June 21, 1966  Transition of Care [facility]) CM/SW Contact  Joanne Chars, [name] Phone Number: 01/01/2023, 1:34 PM  Clinical Narrative:    CSW staffed case with [facility] supervisor Barbette Or.  She will continue to work on uncovering any additional potential placement options.     CSW updated pt that still no bed offers.   CSW discussed pt disability income.  Pt reminded CSW that we had discussed this before and CSW agreed.  Pt reports $914/month disability income.     Expected Discharge Plan: [place] Barriers to Discharge: Continued Medical Work up, SNF Pending bed offer  Expected Discharge Plan and Services   Discharge Planning Services: CM Consult Post Acute Care Choice: [place] Living arrangements for the past 2 months: Homeless                                       Social Determinants of Health (SDOH) Interventions [place]: Food Insecurity Present (11/24/2022)  Housing: Medium Risk (12/07/2022)  Transportation Needs: Unmet Transportation Needs (12/07/2022)  Utilities: Not At Risk (12/07/2022)  Alcohol Screen: Medium Risk (06/24/2019)  Depression (PHQ2-9): Medium Risk (02/10/2021)  Tobacco Use: High Risk (12/11/2022)    Readmission Risk Interventions    12/09/2022    9:02 AM 12/01/2022    3:56 PM 08/18/2022    9:00 AM  Readmission Risk Prevention Plan  Transportation Screening Complete Complete Complete  Medication Review Press photographer) Complete Complete Complete  PCP or Specialist appointment within 3-5 days of discharge Complete Complete Complete  HRI or Home Care Consult Complete Complete Complete  SW Recovery Care/Counseling Consult Complete Complete Complete  Palliative Care Screening Not Applicable Not Applicable Not [place] Not Applicable Not Applicable  Not Applicable  SNF Comments  n/a

## 2023-01-01 NOTE — Progress Notes (Signed)
Patient ID: [name], male   DOB: 1966/09/12, 57 y.o.   MRN: [id number] Patient is status post left below-knee amputation.  The incision is well-healed there is no wound dehiscence.  The compression sock was placed by the patient he should wear this at all times.  I will follow-up after discharge to remove the staples.

## 2023-01-01 NOTE — Progress Notes (Signed)
[facility]  PROGRESS NOTE  [name] FTD:[id number] DOB: 14-Sep-1966 DOA: 12/07/2022 PCP: Marliss Coots, NP   Brief HPI:   57 year old male with past medical history of chronic combined CHF, CAD, hyperlipidemia, polyneuropathy, bipolar disorder, COPD, homelessness presented with lower extremity chronic venous stasis wound with concern for superimposed infection.  He was seen by orthopedic surgery and was recommended for ongoing left BKA.  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  Currently awaiting skilled nursing facility placement.    Subjective   Patient seen and examined, pain adequately controlled.   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 Sharol Given -Started on doxycycline for 2 weeks -PT consulted, recommended skilled nursing facility; [facility] consulted -Pain control with Neurontin,  20 mg of oxycodone every 4 hours as needed -Ortho to follow-up after discharge to remove the staples; recommend to wear compression sock in the left stump all the time  Hypertension -Blood pressure is well-controlled -Continue as needed hydralazine -Continue Imdur, amlodipine, Lasix  Chronic combined CHF -Appears well compensated -Continue Lasix 40 mg p.o. daily  COPD -Stable  Anxiety -Continue Atarax as needed  GERD -Continue PPI  Constipation -Continue senna, Colace, MiraLAX    Medications     amLODipine  10 mg Oral Daily   vitamin C  1,000 mg Oral Daily   collagenase   Topical Daily   docusate sodium  100 mg Oral Daily   doxycycline  100 mg Oral C62B   folic acid  1 mg Oral Daily   furosemide  40 mg Oral Daily   gabapentin  900 mg Oral TID   isosorbide mononitrate  15 mg Oral Daily   multivitamin with minerals  1 tablet Oral Daily   nutrition supplement (JUVEN)  1 packet Oral BID BM   pantoprazole  40 mg Oral Daily    polyethylene glycol  17 g Oral Daily   senna-docusate  1 tablet Oral BID   thiamine  100 mg Oral Daily     Data Reviewed:   CBG:  Recent Labs  Lab 12/30/22 1216 12/30/22 1626 12/31/22 0813 12/31/22 1126 12/31/22 1646  GLUCAP 138* 157* 144* 84 109*    SpO2: 99 % O2 Flow Rate (L/min): 2 L/min    Vitals:   12/31/22 1435 12/31/22 1955 12/31/22 2209 01/01/23 0752  BP: 111/64  134/83 (!) 138/93  Pulse: 84 79 84 78  Resp:  16  18  Temp: 97.8 F (36.6 C) 98.6 F (37 C)  98.6 F (37 C)  TempSrc: Oral Oral  Oral  SpO2: 97% 100%  99%  Weight:      Height:          Data Reviewed:  Basic Metabolic Panel: Recent Labs  Lab 12/27/22 1109  NA 136  K 3.8  CL 100  CO2 23  GLUCOSE 109*  BUN 20  CREATININE 0.88  CALCIUM 9.4    CBC: Recent Labs  Lab 12/27/22 1109  WBC 6.2  NEUTROABS 3.2  HGB 14.1  HCT 41.3  MCV 89.4  PLT 398    LFT No results for input(s): "AST", "ALT", "ALKPHOS", "BILITOT", "PROT", "ALBUMIN" in the last 168 hours.   Antibiotics: Anti-infectives (From admission, onward)    Start     Dose/Rate Route Frequency Ordered Stop   12/26/22 2200  doxycycline (VIBRA-TABS) tablet 100 mg        100 mg Oral  Every 12 hours 12/26/22 1307 01/09/23 2159   12/24/22 1000  doxycycline (VIBRA-TABS) tablet 100 mg  Status:  Discontinued        100 mg Oral Every 12 hours 12/24/22 0715 12/26/22 1307   12/10/22 1830  ceFAZolin (ANCEF) IVPB 2g/100 mL premix  Status:  Discontinued        2 g 200 mL/hr over 30 Minutes Intravenous Every 8 hours 12/10/22 1731 12/10/22 1740   12/10/22 1330  ceFAZolin (ANCEF) IVPB 2g/100 mL premix        2 g 200 mL/hr over 30 Minutes Intravenous On call to O.R. 12/10/22 0959 12/10/22 1534   12/08/22 0530  vancomycin (VANCOREADY) IVPB 1500 mg/300 mL        1,500 mg 150 mL/hr over 120 Minutes Intravenous Every 12 hours 12/07/22 1741 12/11/22 0955   12/08/22 0345  metroNIDAZOLE (FLAGYL) IVPB 500 mg        500 mg 100 mL/hr over 60  Minutes Intravenous Every 12 hours 12/07/22 1742 12/12/22 0344   12/07/22 2330  ceFEPIme (MAXIPIME) 2 g in sodium chloride 0.9 % 100 mL IVPB        2 g 200 mL/hr over 30 Minutes Intravenous Every 8 hours 12/07/22 1741 12/11/22 1529   12/07/22 2200  metroNIDAZOLE (FLAGYL) IVPB 500 mg  Status:  Discontinued        500 mg 100 mL/hr over 60 Minutes Intravenous Every 12 hours 12/07/22 2048 12/07/22 2056   12/07/22 1530  ceFEPIme (MAXIPIME) 2 g in sodium chloride 0.9 % 100 mL IVPB        2 g 200 mL/hr over 30 Minutes Intravenous  Once 12/07/22 1516 12/07/22 1734   12/07/22 1530  vancomycin (VANCOREADY) IVPB 1500 mg/300 mL        1,500 mg 150 mL/hr over 120 Minutes Intravenous  Once 12/07/22 1516 12/09/22 0333   12/07/22 1515  metroNIDAZOLE (FLAGYL) IVPB 500 mg        500 mg 100 mL/hr over 60 Minutes Intravenous  Once 12/07/22 1506 12/07/22 1734   12/07/22 1500  ceFAZolin (ANCEF) IVPB 1 g/50 mL premix  Status:  Discontinued        1 g 100 mL/hr over 30 Minutes Intravenous  Once 12/07/22 1452 12/07/22 1513        DVT prophylaxis: SCDs  Code Status: Full code  Family Communication: No family at bedside   CONSULTS orthopedics   Objective    Physical Examination:  Appears in no acute distress S1-S2, regular, no murmur auscultated Lungs clear to auscultation bilaterally Extremities-bilateral BKA   Status is: Inpatient:             [name]   [facility] If 7PM-7AM, please contact night-coverage at [email or web address], Office  [phone]   01/01/2023, 8:34 AM  LOS: 25 days

## 2023-01-02 DIAGNOSIS — S81809A Unspecified open wound, unspecified lower leg, initial encounter: Secondary | ICD-10-CM | POA: Diagnosis not present

## 2023-01-02 DIAGNOSIS — L03116 Cellulitis of left lower limb: Secondary | ICD-10-CM | POA: Diagnosis not present

## 2023-01-02 DIAGNOSIS — L0390 Cellulitis, unspecified: Secondary | ICD-10-CM | POA: Diagnosis not present

## 2023-01-02 DIAGNOSIS — I96 Gangrene, not elsewhere classified: Secondary | ICD-10-CM | POA: Diagnosis not present

## 2023-01-02 LAB — BASIC METABOLIC PANEL
Anion gap: 10 (ref 5–15)
BUN: 16 mg/dL (ref 6–20)
CO2: 25 mmol/L (ref 22–32)
Calcium: 9.3 mg/dL (ref 8.9–10.3)
Chloride: 104 mmol/L (ref 98–111)
Creatinine, Ser: 0.9 mg/dL (ref 0.61–1.24)
GFR, Estimated: >60 mL/min (ref >60)
Glucose, Bld: 151 mg/dL — ABNORMAL HIGH (ref 70–99)
Potassium: 3.8 mmol/L (ref 3.5–5.1)
Sodium: 139 mmol/L (ref 135–145)

## 2023-01-02 LAB — CBC
HCT: 41.8 % (ref 39.0–52.0)
Hemoglobin: 13.5 g/dL (ref 13.0–17.0)
MCH: 29.3 pg (ref 26.0–34.0)
MCHC: 32.3 g/dL (ref 30.0–36.0)
MCV: 90.9 fL (ref 80.0–100.0)
Platelets: 340 10*3/uL (ref 150–400)
RBC: 4.6 MIL/uL (ref 4.22–5.81)
RDW: 14 % (ref 11.5–15.5)
WBC: 5.4 10*3/uL (ref 4.0–10.5)
nRBC: 0 % (ref 0.0–0.2)

## 2023-01-02 NOTE — Progress Notes (Signed)
Mobility Specialist Progress Note   01/02/23 1448  Mobility  Activity  (Transferred to BR)  Level of Assistance Minimal assist, patient does 75% or more  Assistive Device Wheelchair  LLE Weight Bearing NWB  Activity Response Tolerated well  Mobility Referral Yes  $Mobility charge 1 Mobility   Pt requesting assistance to get from bed to BR d/t BM urgency. Required MinA to steady w/c for AP transfer out of bed and for lateral transfer to toilet. Required increase time but pt able to transfer w/o physical assist. Pt left on toilet w/ RN aware of position.  [place] Falling Mobility Specialist Please contact via SecureChat or  Rehab office at [phone]

## 2023-01-02 NOTE — Progress Notes (Signed)
Occupational Therapy Treatment Patient Details Name: [name] MRN: [id number] DOB: 08/01/1966 Today's Date: 01/02/2023   History of present illness Pt is a 57 y/o M who presented 12/17/22 with L ankle cellulitis and underwent L BKA on 1/10. PMH includes asthma, bioplar 1, depression, schizoaffective disorder, CVA, chronic CHF, homelessness, HTN, DM2, cocaine and cannabis use, and R AKA.   OT comments  Session focused on increasing left knee ROM and decrease pain level in order to increase functional performance during lateral and AP transfers to allow pt to complete at highest level of independence. Patient was able to gradually increase stretch time with left knee. Able to demonstrate close to full knee extension when provided active assist. Education provided on stretches and purpose of achieving full knee extension. Pt verbalized understanding.    Recommendations for follow up therapy are one component of a multi-disciplinary discharge planning process, led by the attending physician.  Recommendations may be updated based on patient status, additional functional criteria and insurance authorization.    Follow Up Recommendations  Skilled nursing-short term rehab (<3 hours/day)     Assistance Recommended at Discharge PRN  Patient can return home with the following  A little help with walking and/or transfers;A little help with bathing/dressing/bathroom;Help with stairs or ramp for entrance;Assist for transportation   Equipment Recommendations  Other (comment) (defer to next venue of care)       Precautions / Restrictions Precautions Precautions: Fall Precaution Comments: R AKA, L BKA Restrictions Weight Bearing Restrictions: No LLE Weight Bearing: Non weight bearing              ADL either performed or assessed with clinical judgement      Cognition Arousal/Alertness: Awake/alert Behavior During Therapy: WFL for tasks assessed/performed Overall Cognitive Status: No  family/caregiver present to determine baseline cognitive functioning     Following Commands: Follows multi-step commands consistently Safety/Judgement: Decreased awareness of deficits, Decreased awareness of safety              Exercises Other Exercises Other Exercises: Myofascial release completed to left residual limb and knee to decrease fascial restrictions and increase joint mobility in a pain free zone. Other Exercises: Seated, A/ROM left knee flexion/extension, 10X Other Exercises: Seated, dyanmic knee extension stretch Other Exercises: Pt participated in passive knee extension stretch itilizing mat table to assist. 3 trials completed. 1st trial: 30", 2nd trial: 1', 3rd trial: 2'            Pertinent Vitals/ Pain       Pain Assessment Pain Assessment: Faces Faces Pain Scale: Hurts little more Pain Location: left residual limb Pain Descriptors / Indicators: Operative site guarding, Grimacing, Sore Pain Intervention(s): Limited activity within patient's tolerance, Monitored during session         Frequency  Min 2X/week        Progress Toward Goals  OT Goals(current goals can now be found in the care plan section)  Progress towards OT goals: Progressing toward goals     Plan Discharge plan remains appropriate;Frequency remains appropriate       AM-PAC OT "6 Clicks" Daily Activity     Outcome Measure   Help from another person eating meals?: None Help from another person taking care of personal grooming?: None Help from another person toileting, which includes using toliet, bedpan, or urinal?: A Little Help from another person bathing (including washing, rinsing, drying)?: A Little Help from another person to put on and taking off regular upper body clothing?: None Help from  another person to put on and taking off regular lower body clothing?: A Little 6 Click Score: 21    End of Session    OT Visit Diagnosis: Muscle weakness (generalized)  (M62.81);Pain Pain - Right/Left: Left Pain - part of body: Leg   Activity Tolerance Patient tolerated treatment well   Patient Left in chair (wheelchair - personal)           Time: 3559-7416 OT Time Calculation (min): 25 min  Charges: OT General Charges $OT Visit: 1 Visit OT Treatments $Therapeutic Exercise: 23-37 mins  Ailene Ravel, OTR/L,CBIS  Supplemental OT - [facility] and [facility] Secure Chat Preferred    Nathanial Arrighi, [name] 01/02/2023, 4:03 PM

## 2023-01-02 NOTE — Progress Notes (Signed)
[facility]  PROGRESS NOTE  [name] ZHG:[id number] DOB: [birth year] DOA: 12/07/2022 PCP: Marliss Coots, NP   Brief HPI:   57 year old male with past medical history of chronic combined CHF, CAD, hyperlipidemia, polyneuropathy, bipolar disorder, COPD, homelessness presented with lower extremity chronic venous stasis wound with concern for superimposed infection.  He was seen by orthopedic surgery and was recommended for ongoing left BKA.  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  Currently awaiting skilled nursing facility placement.    Subjective   Patient seen and examined, no new complaints.  Awaiting bed at skilled nursing facility.   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 Sharol Given -Started on doxycycline for 2 weeks -PT consulted, recommended skilled nursing facility; [facility] consulted -Pain control with Neurontin,  20 mg of oxycodone every 4 hours as needed -Ortho to follow-up after discharge to remove the staples; recommend to wear compression sock in the left stump all the time  Hypertension -Blood pressure is well-controlled -Continue as needed hydralazine -Continue Imdur, amlodipine, Lasix  Chronic combined CHF -Appears well compensated -Continue Lasix 40 mg p.o. daily  COPD -Stable  Anxiety -Continue Atarax as needed  GERD -Continue PPI  Constipation -Continue senna, Colace, MiraLAX    Medications     amLODipine  10 mg Oral Daily   vitamin C  1,000 mg Oral Daily   collagenase   Topical Daily   docusate sodium  100 mg Oral Daily   doxycycline  100 mg Oral H96Q   folic acid  1 mg Oral Daily   furosemide  40 mg Oral Daily   gabapentin  900 mg Oral TID   isosorbide mononitrate  15 mg Oral Daily   multivitamin with minerals  1 tablet Oral Daily   nutrition supplement (JUVEN)  1 packet Oral BID BM    pantoprazole  40 mg Oral Daily   polyethylene glycol  17 g Oral Daily   senna-docusate  1 tablet Oral BID   thiamine  100 mg Oral Daily     Data Reviewed:   CBG:  Recent Labs  Lab 12/30/22 1216 12/30/22 1626 12/31/22 0813 12/31/22 1126 12/31/22 1646  GLUCAP 138* 157* 144* 84 109*    SpO2: 100 % O2 Flow Rate (L/min): 2 L/min    Vitals:   01/01/23 1550 01/01/23 2006 01/02/23 0810 01/02/23 1242  BP: (!) 146/98 (!) 152/98 132/76 115/86  Pulse: 85 86 83 81  Resp: 17 18 18 19   Temp: 97.9 F (36.6 C) 98.4 F (36.9 C) 98.4 F (36.9 C) 98.5 F (36.9 C)  TempSrc: Oral     SpO2: 100% 96% 99% 100%  Weight:      Height:          Data Reviewed:  Basic Metabolic Panel: Recent Labs  Lab 12/27/22 1109 01/02/23 1034  NA 136 139  K 3.8 3.8  CL 100 104  CO2 23 25  GLUCOSE 109* 151*  BUN 20 16  CREATININE 0.88 0.90  CALCIUM 9.4 9.3    CBC: Recent Labs  Lab 12/27/22 1109 01/02/23 1034  WBC 6.2 5.4  NEUTROABS 3.2  --   HGB 14.1 13.5  HCT 41.3 41.8  MCV 89.4 90.9  PLT 398 340    LFT No results for input(s): "AST", "ALT", "ALKPHOS", "BILITOT", "PROT", "ALBUMIN" in the last 168 hours.   Antibiotics: Anti-infectives (From admission, onward)  Start     Dose/Rate Route Frequency Ordered Stop   12/26/22 2200  doxycycline (VIBRA-TABS) tablet 100 mg        100 mg Oral Every 12 hours 12/26/22 1307 01/09/23 2159   12/24/22 1000  doxycycline (VIBRA-TABS) tablet 100 mg  Status:  Discontinued        100 mg Oral Every 12 hours 12/24/22 0715 12/26/22 1307   12/10/22 1830  ceFAZolin (ANCEF) IVPB 2g/100 mL premix  Status:  Discontinued        2 g 200 mL/hr over 30 Minutes Intravenous Every 8 hours 12/10/22 1731 12/10/22 1740   12/10/22 1330  ceFAZolin (ANCEF) IVPB 2g/100 mL premix        2 g 200 mL/hr over 30 Minutes Intravenous On call to O.R. 12/10/22 0959 12/10/22 1534   12/08/22 0530  vancomycin (VANCOREADY) IVPB 1500 mg/300 mL        1,500 mg 150 mL/hr over  120 Minutes Intravenous Every 12 hours 12/07/22 1741 12/11/22 0955   12/08/22 0345  metroNIDAZOLE (FLAGYL) IVPB 500 mg        500 mg 100 mL/hr over 60 Minutes Intravenous Every 12 hours 12/07/22 1742 12/12/22 0344   12/07/22 2330  ceFEPIme (MAXIPIME) 2 g in sodium chloride 0.9 % 100 mL IVPB        2 g 200 mL/hr over 30 Minutes Intravenous Every 8 hours 12/07/22 1741 12/11/22 1529   12/07/22 2200  metroNIDAZOLE (FLAGYL) IVPB 500 mg  Status:  Discontinued        500 mg 100 mL/hr over 60 Minutes Intravenous Every 12 hours 12/07/22 2048 12/07/22 2056   12/07/22 1530  ceFEPIme (MAXIPIME) 2 g in sodium chloride 0.9 % 100 mL IVPB        2 g 200 mL/hr over 30 Minutes Intravenous  Once 12/07/22 1516 12/07/22 1734   12/07/22 1530  vancomycin (VANCOREADY) IVPB 1500 mg/300 mL        1,500 mg 150 mL/hr over 120 Minutes Intravenous  Once 12/07/22 1516 12/09/22 0333   12/07/22 1515  metroNIDAZOLE (FLAGYL) IVPB 500 mg        500 mg 100 mL/hr over 60 Minutes Intravenous  Once 12/07/22 1506 12/07/22 1734   12/07/22 1500  ceFAZolin (ANCEF) IVPB 1 g/50 mL premix  Status:  Discontinued        1 g 100 mL/hr over 30 Minutes Intravenous  Once 12/07/22 1452 12/07/22 1513        DVT prophylaxis: SCDs  Code Status: Full code  Family Communication: No family at bedside   CONSULTS orthopedics   Objective    Physical Examination:  Appears in no acute distress S1-S2, regular Lungs are clear to auscultation bilaterally Extremities-left BKA stump, staples in place, no erythema noted   Status is: Inpatient:             [name]   [facility] If 7PM-7AM, please contact night-coverage at [email or web address], Office  [phone]   01/02/2023, 1:47 PM  LOS: 26 days

## 2023-01-03 DIAGNOSIS — L03116 Cellulitis of left lower limb: Secondary | ICD-10-CM | POA: Diagnosis not present

## 2023-01-03 DIAGNOSIS — S81809A Unspecified open wound, unspecified lower leg, initial encounter: Secondary | ICD-10-CM | POA: Diagnosis not present

## 2023-01-03 DIAGNOSIS — I96 Gangrene, not elsewhere classified: Secondary | ICD-10-CM | POA: Diagnosis not present

## 2023-01-03 DIAGNOSIS — L0390 Cellulitis, unspecified: Secondary | ICD-10-CM | POA: Diagnosis not present

## 2023-01-03 MED ORDER — HYDRALAZINE HCL 25 MG PO TABS
25.0000 mg | ORAL_TABLET | Freq: Four times a day (QID) | ORAL | Status: DC | PRN
  Administered 2023-04-16: 25 mg via ORAL
  Filled 2023-01-03 (×2): qty 1

## 2023-01-03 MED ORDER — POLYETHYLENE GLYCOL 3350 17 G PO PACK: 17.0000 g | PACK | Freq: Every day | ORAL | Status: DC | PRN

## 2023-01-03 NOTE — Progress Notes (Signed)
[facility]  PROGRESS NOTE  [name] [id number]:[id number] DOB: 04/27/1966 DOA: 12/07/2022 PCP: Marliss Coots, NP   Brief HPI:   57 year old male with past medical history of chronic combined CHF, CAD, hyperlipidemia, polyneuropathy, bipolar disorder, COPD, homelessness presented with lower extremity chronic venous stasis wound with concern for superimposed infection.  He was seen by orthopedic surgery and was recommended for ongoing left BKA.  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  Currently awaiting skilled nursing facility placement.    Subjective   Patient seen and examined, still complains of pain in the left stump.   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 Sharol Given -Started on doxycycline for 2 weeks -PT consulted, recommended skilled nursing facility; [facility] consulted -Pain control with Neurontin,  20 mg of oxycodone every 4 hours as needed -Ortho to follow-up after discharge to remove the staples; recommend to wear compression sock in the left stump all the time  Hypertension -Blood pressure is well-controlled -Continue as needed hydralazine -Continue Imdur, amlodipine, Lasix  Chronic combined CHF -Appears well compensated -Continue Lasix 40 mg p.o. daily  COPD -Stable  Anxiety -Continue Atarax as needed  GERD -Continue PPI  Constipation -Continue senna, Colace, MiraLAX    Medications     amLODipine  10 mg Oral Daily   vitamin C  1,000 mg Oral Daily   collagenase   Topical Daily   docusate sodium  100 mg Oral Daily   doxycycline  100 mg Oral F81W   folic acid  1 mg Oral Daily   furosemide  40 mg Oral Daily   gabapentin  900 mg Oral TID   isosorbide mononitrate  15 mg Oral Daily   multivitamin with minerals  1 tablet Oral Daily   nutrition supplement (JUVEN)  1 packet Oral BID BM   pantoprazole  40  mg Oral Daily   polyethylene glycol  17 g Oral Daily   senna-docusate  1 tablet Oral BID   thiamine  100 mg Oral Daily     Data Reviewed:   CBG:  Recent Labs  Lab 12/30/22 1216 12/30/22 1626 12/31/22 0813 12/31/22 1126 12/31/22 1646  GLUCAP 138* 157* 144* 84 109*    SpO2: 97 % O2 Flow Rate (L/min): 2 L/min    Vitals:   01/02/23 1927 01/03/23 0401 01/03/23 0732 01/03/23 0820  BP: (!) 151/102 (!) 137/90  (!) 167/119  Pulse: 94 88  96  Resp: 17 18  14   Temp: 98.4 F (36.9 C) 97.8 F (36.6 C) 98.1 F (36.7 C)   TempSrc:   Oral   SpO2: 94% 95%  97%  Weight:      Height:          Data Reviewed:  Basic Metabolic Panel: Recent Labs  Lab 12/27/22 1109 01/02/23 1034  NA 136 139  K 3.8 3.8  CL 100 104  CO2 23 25  GLUCOSE 109* 151*  BUN 20 16  CREATININE 0.88 0.90  CALCIUM 9.4 9.3    CBC: Recent Labs  Lab 12/27/22 1109 01/02/23 1034  WBC 6.2 5.4  NEUTROABS 3.2  --   HGB 14.1 13.5  HCT 41.3 41.8  MCV 89.4 90.9  PLT 398 340    LFT No results for input(s): "AST", "ALT", "ALKPHOS", "BILITOT", "PROT", "ALBUMIN" in the last 168 hours.   Antibiotics: Anti-infectives (From admission, onward)    Start  Dose/Rate Route Frequency Ordered Stop   12/26/22 2200  doxycycline (VIBRA-TABS) tablet 100 mg        100 mg Oral Every 12 hours 12/26/22 1307 01/09/23 2159   12/24/22 1000  doxycycline (VIBRA-TABS) tablet 100 mg  Status:  Discontinued        100 mg Oral Every 12 hours 12/24/22 0715 12/26/22 1307   12/10/22 1830  ceFAZolin (ANCEF) IVPB 2g/100 mL premix  Status:  Discontinued        2 g 200 mL/hr over 30 Minutes Intravenous Every 8 hours 12/10/22 1731 12/10/22 1740   12/10/22 1330  ceFAZolin (ANCEF) IVPB 2g/100 mL premix        2 g 200 mL/hr over 30 Minutes Intravenous On call to O.R. 12/10/22 0959 12/10/22 1534   12/08/22 0530  vancomycin (VANCOREADY) IVPB 1500 mg/300 mL        1,500 mg 150 mL/hr over 120 Minutes Intravenous Every 12 hours  12/07/22 1741 12/11/22 0955   12/08/22 0345  metroNIDAZOLE (FLAGYL) IVPB 500 mg        500 mg 100 mL/hr over 60 Minutes Intravenous Every 12 hours 12/07/22 1742 12/12/22 0344   12/07/22 2330  ceFEPIme (MAXIPIME) 2 g in sodium chloride 0.9 % 100 mL IVPB        2 g 200 mL/hr over 30 Minutes Intravenous Every 8 hours 12/07/22 1741 12/11/22 1529   12/07/22 2200  metroNIDAZOLE (FLAGYL) IVPB 500 mg  Status:  Discontinued        500 mg 100 mL/hr over 60 Minutes Intravenous Every 12 hours 12/07/22 2048 12/07/22 2056   12/07/22 1530  ceFEPIme (MAXIPIME) 2 g in sodium chloride 0.9 % 100 mL IVPB        2 g 200 mL/hr over 30 Minutes Intravenous  Once 12/07/22 1516 12/07/22 1734   12/07/22 1530  vancomycin (VANCOREADY) IVPB 1500 mg/300 mL        1,500 mg 150 mL/hr over 120 Minutes Intravenous  Once 12/07/22 1516 12/09/22 0333   12/07/22 1515  metroNIDAZOLE (FLAGYL) IVPB 500 mg        500 mg 100 mL/hr over 60 Minutes Intravenous  Once 12/07/22 1506 12/07/22 1734   12/07/22 1500  ceFAZolin (ANCEF) IVPB 1 g/50 mL premix  Status:  Discontinued        1 g 100 mL/hr over 30 Minutes Intravenous  Once 12/07/22 1452 12/07/22 1513        DVT prophylaxis: SCDs  Code Status: Full code  Family Communication: No family at bedside   CONSULTS orthopedics   Objective    Physical Examination:  Appears in no acute distress S1-S2, regular, no murmur auscultated Lungs are clear to auscultation bilaterally Abdomen is soft, nontender, no organomegaly Extremities no edema  Status is: Inpatient:             [name]   [facility] If 7PM-7AM, please contact night-coverage at [email or web address], Office  [phone]   01/03/2023, 8:56 AM  LOS: 27 days

## 2023-01-03 NOTE — Plan of Care (Signed)
  Problem: Fluid Volume: Goal: Hemodynamic stability will improve Outcome: Adequate for Discharge   Problem: Clinical Measurements: Goal: Diagnostic test results will improve Outcome: Adequate for Discharge Goal: Signs and symptoms of infection will decrease Outcome: Adequate for Discharge   

## 2023-01-03 NOTE — Progress Notes (Signed)
Reporting an extensive amount of pain stemming from left knee

## 2023-01-04 DIAGNOSIS — L0390 Cellulitis, unspecified: Secondary | ICD-10-CM | POA: Diagnosis not present

## 2023-01-04 DIAGNOSIS — I96 Gangrene, not elsewhere classified: Secondary | ICD-10-CM | POA: Diagnosis not present

## 2023-01-04 DIAGNOSIS — S81809A Unspecified open wound, unspecified lower leg, initial encounter: Secondary | ICD-10-CM | POA: Diagnosis not present

## 2023-01-04 DIAGNOSIS — L03116 Cellulitis of left lower limb: Secondary | ICD-10-CM | POA: Diagnosis not present

## 2023-01-04 NOTE — Progress Notes (Signed)
Mobility Specialist: Progress Note   01/04/23 1617  Mobility  Activity  (Wheelchair mobility)  Level of Assistance Minimal assist, patient does 75% or more  Assistive Device Wheelchair  Distance Ambulated (ft)  (Propelled 1100')  LLE Weight Bearing NWB  Activity Response Tolerated well  Mobility Referral Yes  $Mobility charge 1 Mobility   Pt received in the bed and agreeable to mobility. Mod I with bed mobility to scoot posteriorly into the wheelchair. Completed 2 laps around the unit with no c/o throughout. Pt requesting to use BR after session. MinA to lateral scoot from wheelchair to commode as well as back to the wheelchair. Pt requesting to continue wheelchair mobility, RN notified.   [place] Won Kreuzer Mobility Specialist Please contact via SecureChat or Rehab office at [phone]

## 2023-01-04 NOTE — Progress Notes (Signed)
[name]  PROGRESS NOTE  [name] QIH:[id number] DOB: [birth year] DOA: 12/07/2022 PCP: [name] Coots, NP   Brief HPI:   57 year old male with past medical history of chronic combined CHF, CAD, hyperlipidemia, polyneuropathy, bipolar disorder, COPD, homelessness presented with lower extremity chronic venous stasis wound with concern for superimposed infection.  He was seen by orthopedic surgery and was recommended for ongoing left BKA.  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  Currently awaiting skilled nursing facility placement.    Subjective   Patient seen and examined, no new complaints.   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 Sharol Given -Started on doxycycline for 2 weeks -PT consulted, recommended skilled nursing facility; [name] -Pain control with Neurontin,  20 mg of oxycodone every 4 hours as needed -Ortho to follow-up after discharge to remove the staples; recommend to wear compression sock in the left stump all the time  Hypertension -Blood pressure is well-controlled -Continue as needed hydralazine -Continue Imdur, amlodipine, Lasix  Chronic combined CHF -Appears well compensated -Continue Lasix 40 mg p.o. daily -Follow BMP in am  COPD -Stable  Anxiety -Continue Atarax as needed  GERD -Continue PPI  Constipation -Continue senna, Colace, MiraLAX    Medications     amLODipine  10 mg Oral Daily   vitamin C  1,000 mg Oral Daily   collagenase   Topical Daily   docusate sodium  100 mg Oral Daily   doxycycline  100 mg Oral O75I   folic acid  1 mg Oral Daily   furosemide  40 mg Oral Daily   gabapentin  900 mg Oral TID   isosorbide mononitrate  15 mg Oral Daily   multivitamin with minerals  1 tablet Oral Daily   pantoprazole  40 mg Oral Daily   senna-docusate  1 tablet Oral BID    thiamine  100 mg Oral Daily     Data Reviewed:   CBG:  Recent Labs  Lab 12/30/22 1216 12/30/22 1626 12/31/22 0813 12/31/22 1126 12/31/22 1646  GLUCAP 138* 157* 144* 84 109*    SpO2: 100 % O2 Flow Rate (L/min): 2 L/min    Vitals:   01/03/23 1058 01/03/23 1715 01/03/23 2002 01/04/23 0819  BP: (!) 131/97 (!) 144/101 135/87 102/71  Pulse: 92 90 91 82  Resp:  12 20   Temp:  98.4 F (36.9 C) 97.9 F (36.6 C)   TempSrc:  Oral Oral   SpO2:  94% 100%   Weight:      Height:          Data Reviewed:  Basic Metabolic Panel: Recent Labs  Lab 01/02/23 1034  NA 139  K 3.8  CL 104  CO2 25  GLUCOSE 151*  BUN 16  CREATININE 0.90  CALCIUM 9.3    CBC: Recent Labs  Lab 01/02/23 1034  WBC 5.4  HGB 13.5  HCT 41.8  MCV 90.9  PLT 340    LFT No results for input(s): "AST", "ALT", "ALKPHOS", "BILITOT", "PROT", "ALBUMIN" in the last 168 hours.   Antibiotics: Anti-infectives (From admission, onward)    Start     Dose/Rate Route Frequency Ordered Stop   12/26/22 2200  doxycycline (VIBRA-TABS) tablet 100 mg        100 mg Oral Every 12 hours 12/26/22 1307 01/09/23 2159   12/24/22 1000  doxycycline (VIBRA-TABS) tablet 100 mg  Status:  Discontinued  100 mg Oral Every 12 hours 12/24/22 0715 12/26/22 1307   12/10/22 1830  ceFAZolin (ANCEF) IVPB 2g/100 mL premix  Status:  Discontinued        2 g 200 mL/hr over 30 Minutes Intravenous Every 8 hours 12/10/22 1731 12/10/22 1740   12/10/22 1330  ceFAZolin (ANCEF) IVPB 2g/100 mL premix        2 g 200 mL/hr over 30 Minutes Intravenous On call to O.R. 12/10/22 0959 12/10/22 1534   12/08/22 0530  vancomycin (VANCOREADY) IVPB 1500 mg/300 mL        1,500 mg 150 mL/hr over 120 Minutes Intravenous Every 12 hours 12/07/22 1741 12/11/22 0955   12/08/22 0345  metroNIDAZOLE (FLAGYL) IVPB 500 mg        500 mg 100 mL/hr over 60 Minutes Intravenous Every 12 hours 12/07/22 1742 12/12/22 0344   12/07/22 2330  ceFEPIme (MAXIPIME) 2 g  in sodium chloride 0.9 % 100 mL IVPB        2 g 200 mL/hr over 30 Minutes Intravenous Every 8 hours 12/07/22 1741 12/11/22 1529   12/07/22 2200  metroNIDAZOLE (FLAGYL) IVPB 500 mg  Status:  Discontinued        500 mg 100 mL/hr over 60 Minutes Intravenous Every 12 hours 12/07/22 2048 12/07/22 2056   12/07/22 1530  ceFEPIme (MAXIPIME) 2 g in sodium chloride 0.9 % 100 mL IVPB        2 g 200 mL/hr over 30 Minutes Intravenous  Once 12/07/22 1516 12/07/22 1734   12/07/22 1530  vancomycin (VANCOREADY) IVPB 1500 mg/300 mL        1,500 mg 150 mL/hr over 120 Minutes Intravenous  Once 12/07/22 1516 12/09/22 0333   12/07/22 1515  metroNIDAZOLE (FLAGYL) IVPB 500 mg        500 mg 100 mL/hr over 60 Minutes Intravenous  Once 12/07/22 1506 12/07/22 1734   12/07/22 1500  ceFAZolin (ANCEF) IVPB 1 g/50 mL premix  Status:  Discontinued        1 g 100 mL/hr over 30 Minutes Intravenous  Once 12/07/22 1452 12/07/22 1513        DVT prophylaxis: SCDs  Code Status: Full code  Family Communication: No family at bedside   CONSULTS orthopedics   Objective    Physical Examination:  Appears in no acute distress S1-S2, regular Lungs clear to auscultation bilaterally Extremities-bilateral BKA  Status is: Inpatient:             [name]   [name] If 7PM-7AM, please contact night-coverage at [email or web address], Office  [phone]   01/04/2023, 8:57 AM  LOS: 28 days

## 2023-01-04 NOTE — Plan of Care (Signed)

## 2023-01-04 NOTE — Plan of Care (Signed)
  Problem: Fluid Volume: Goal: Hemodynamic stability will improve Outcome: Progressing   

## 2023-01-05 DIAGNOSIS — L0390 Cellulitis, unspecified: Secondary | ICD-10-CM | POA: Diagnosis not present

## 2023-01-05 DIAGNOSIS — L03116 Cellulitis of left lower limb: Secondary | ICD-10-CM | POA: Diagnosis not present

## 2023-01-05 DIAGNOSIS — S81809A Unspecified open wound, unspecified lower leg, initial encounter: Secondary | ICD-10-CM | POA: Diagnosis not present

## 2023-01-05 DIAGNOSIS — I96 Gangrene, not elsewhere classified: Secondary | ICD-10-CM | POA: Diagnosis not present

## 2023-01-05 LAB — BASIC METABOLIC PANEL
Anion gap: 9 (ref 5–15)
BUN: 18 mg/dL (ref 6–20)
CO2: 23 mmol/L (ref 22–32)
Calcium: 9.2 mg/dL (ref 8.9–10.3)
Chloride: 103 mmol/L (ref 98–111)
Creatinine, Ser: 0.85 mg/dL (ref 0.61–1.24)
GFR, Estimated: >60 mL/min (ref >60)
Glucose, Bld: 103 mg/dL — ABNORMAL HIGH (ref 70–99)
Potassium: 4.3 mmol/L (ref 3.5–5.1)
Sodium: 135 mmol/L (ref 135–145)

## 2023-01-05 LAB — GLUCOSE, CAPILLARY
Glucose-Capillary: 160 mg/dL — ABNORMAL HIGH (ref 70–99)
Glucose-Capillary: 95 mg/dL (ref 70–99)
Glucose-Capillary: 112 mg/dL — ABNORMAL HIGH (ref 70–99)

## 2023-01-05 NOTE — Plan of Care (Signed)

## 2023-01-05 NOTE — Progress Notes (Signed)
[facility]  PROGRESS NOTE  [name] GQQ:[id number] DOB: 01-22-1966 DOA: 12/07/2022 PCP: Marliss Coots, NP   Brief HPI:   57 year old male with past medical history of chronic combined CHF, CAD, hyperlipidemia, polyneuropathy, bipolar disorder, COPD, homelessness presented with lower extremity chronic venous stasis wound with concern for superimposed infection.  He was seen by orthopedic surgery and was recommended for ongoing left BKA.  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  Currently awaiting skilled nursing facility placement.    Subjective   Patient seen and examined, complains of pain in the left stump.   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 Sharol Given -Started on doxycycline for 2 weeks; stop date 01/09/23 -PT consulted, recommended skilled nursing facility; [facility] consulted -Pain control with Neurontin,  20 mg of oxycodone every 4 hours as needed -Ortho to follow-up after discharge to remove the staples; recommend to wear compression sock in the left stump all the time  Hypertension -Blood pressure is well-controlled -Continue as needed hydralazine -Continue Imdur, amlodipine, Lasix  Chronic combined CHF -Appears well compensated -Continue Lasix 40 mg p.o. daily -Follow BMP in am  COPD -Stable  Anxiety -Continue Atarax as needed  GERD -Continue PPI  Constipation -Continue senna, Colace, MiraLAX    Medications     amLODipine  10 mg Oral Daily   vitamin C  1,000 mg Oral Daily   collagenase   Topical Daily   docusate sodium  100 mg Oral Daily   doxycycline  100 mg Oral I71I   folic acid  1 mg Oral Daily   furosemide  40 mg Oral Daily   gabapentin  900 mg Oral TID   isosorbide mononitrate  15 mg Oral Daily   multivitamin with minerals  1 tablet Oral Daily   pantoprazole  40 mg Oral Daily    senna-docusate  1 tablet Oral BID   thiamine  100 mg Oral Daily     Data Reviewed:   CBG:  Recent Labs  Lab 12/30/22 1626 12/31/22 0813 12/31/22 1126 12/31/22 1646 01/05/23 0736  GLUCAP 157* 144* 84 109* 160*    SpO2: 100 % O2 Flow Rate (L/min): 2 L/min    Vitals:   01/04/23 0819 01/04/23 2009 01/05/23 0539 01/05/23 0735  BP: 102/71 123/73 (!) 144/96 123/69  Pulse: 82 79 83 78  Resp: 17 16    Temp: 98.2 F (36.8 C) 98.1 F (36.7 C) 98 F (36.7 C) 97.9 F (36.6 C)  TempSrc:   Oral Oral  SpO2: 96%  98% 100%  Weight:   82 kg   Height:          Data Reviewed:  Basic Metabolic Panel: Recent Labs  Lab 01/02/23 1034 01/05/23 0240  NA 139 135  K 3.8 4.3  CL 104 103  CO2 25 23  GLUCOSE 151* 103*  BUN 16 18  CREATININE 0.90 0.85  CALCIUM 9.3 9.2    CBC: Recent Labs  Lab 01/02/23 1034  WBC 5.4  HGB 13.5  HCT 41.8  MCV 90.9  PLT 340    LFT No results for input(s): "AST", "ALT", "ALKPHOS", "BILITOT", "PROT", "ALBUMIN" in the last 168 hours.   Antibiotics: Anti-infectives (From admission, onward)    Start     Dose/Rate Route Frequency Ordered Stop   12/26/22 2200  doxycycline (VIBRA-TABS) tablet 100 mg        100  mg Oral Every 12 hours 12/26/22 1307 01/09/23 2159   12/24/22 1000  doxycycline (VIBRA-TABS) tablet 100 mg  Status:  Discontinued        100 mg Oral Every 12 hours 12/24/22 0715 12/26/22 1307   12/10/22 1830  ceFAZolin (ANCEF) IVPB 2g/100 mL premix  Status:  Discontinued        2 g 200 mL/hr over 30 Minutes Intravenous Every 8 hours 12/10/22 1731 12/10/22 1740   12/10/22 1330  ceFAZolin (ANCEF) IVPB 2g/100 mL premix        2 g 200 mL/hr over 30 Minutes Intravenous On call to O.R. 12/10/22 0959 12/10/22 1534   12/08/22 0530  vancomycin (VANCOREADY) IVPB 1500 mg/300 mL        1,500 mg 150 mL/hr over 120 Minutes Intravenous Every 12 hours 12/07/22 1741 12/11/22 0955   12/08/22 0345  metroNIDAZOLE (FLAGYL) IVPB 500 mg        500 mg 100  mL/hr over 60 Minutes Intravenous Every 12 hours 12/07/22 1742 12/12/22 0344   12/07/22 2330  ceFEPIme (MAXIPIME) 2 g in sodium chloride 0.9 % 100 mL IVPB        2 g 200 mL/hr over 30 Minutes Intravenous Every 8 hours 12/07/22 1741 12/11/22 1529   12/07/22 2200  metroNIDAZOLE (FLAGYL) IVPB 500 mg  Status:  Discontinued        500 mg 100 mL/hr over 60 Minutes Intravenous Every 12 hours 12/07/22 2048 12/07/22 2056   12/07/22 1530  ceFEPIme (MAXIPIME) 2 g in sodium chloride 0.9 % 100 mL IVPB        2 g 200 mL/hr over 30 Minutes Intravenous  Once 12/07/22 1516 12/07/22 1734   12/07/22 1530  vancomycin (VANCOREADY) IVPB 1500 mg/300 mL        1,500 mg 150 mL/hr over 120 Minutes Intravenous  Once 12/07/22 1516 12/09/22 0333   12/07/22 1515  metroNIDAZOLE (FLAGYL) IVPB 500 mg        500 mg 100 mL/hr over 60 Minutes Intravenous  Once 12/07/22 1506 12/07/22 1734   12/07/22 1500  ceFAZolin (ANCEF) IVPB 1 g/50 mL premix  Status:  Discontinued        1 g 100 mL/hr over 30 Minutes Intravenous  Once 12/07/22 1452 12/07/22 1513        DVT prophylaxis: SCDs  Code Status: Full code  Family Communication: No family at bedside   CONSULTS orthopedics   Objective    Physical Examination:  Appears in no acute distress S/p left BKA Lungs are clear to auscultation bilaterally Abdomen is soft, nontender, no organomegaly  Status is: Inpatient:             [name]   [facility] If 7PM-7AM, please contact night-coverage at [email or web address], Office  [phone]   01/05/2023, 8:41 AM  LOS: 29 days

## 2023-01-05 NOTE — Progress Notes (Signed)
Physical Therapy Treatment  Patient Details Name: [name] MRN: [id number] DOB: 06/07/1966 Today's Date: 01/05/2023   History of Present Illness Pt is a 57 y/o M who presented 12/17/22 with L ankle cellulitis and underwent L BKA on 1/10. PMH includes asthma, bioplar 1, depression, schizoaffective disorder, CVA, chronic CHF, homelessness, HTN, DM2, cocaine and cannabis use, and R AKA.    PT Comments    Pt progressing towards physical therapy goals. Was able to perform transfer bed>wheelchair with min guard assist this session. PT encouraged pt to attempt A<>P transfers, and demonstrated several times, however pt appears confused between A<>P transfers and lateral transfers. Pt having difficulty elevating hips over the wheel of his chair. States "I'm willing to try whatever you think will be best." However, talks in circles about why he cannot do what PT is suggesting, and does not physically try. Will continue to follow for transfer training, as pt is independently mobilizing throughout the hallways several times a day in the wheelchair.   Recommendations for follow up therapy are one component of a multi-disciplinary discharge planning process, led by the attending physician.  Recommendations may be updated based on patient status, additional functional criteria and insurance authorization.  Follow Up Recommendations  Skilled nursing-short term rehab (<3 hours/day) Can patient physically be transported by private vehicle: No   Assistance Recommended at Discharge Intermittent Supervision/Assistance  Patient can return home with the following A little help with walking and/or transfers;A little help with bathing/dressing/bathroom;Assistance with cooking/housework;Assist for transportation;Help with stairs or ramp for entrance   Equipment Recommendations  Other (comment) (Recommending L elevating leg rest for wheelchair but pt refusing it)    Recommendations for Other Services        Precautions / Restrictions Precautions Precautions: Fall Precaution Comments: R AKA, L BKA Restrictions Weight Bearing Restrictions: Yes RLE Weight Bearing: Non weight bearing LLE Weight Bearing: Non weight bearing     Mobility  Bed Mobility Overal bed mobility: Modified Independent Bed Mobility: Supine to Sit           General bed mobility comments: Pt able to transition to EOB without assistance and scoot out to sit with L residual limb dangling.    Transfers Overall transfer level: Needs assistance Equipment used: None Transfers: Bed to chair/wheelchair/BSC            Lateral/Scoot Transfers: Min guard General transfer comment: Pt able to laterally scoot from bed>wheelchair without assistance. Increased time and effort required. Encouraged pt to attempt A<>P transfers however pt unwilling, preferring lateral transfers instead. PT demonstrated A<>P transfer for pt and we discussed this method for bed<>w/c transfers as well as w/c to commode transfers.    Ambulation/Gait               General Gait Details: Lexicographer mobility: Yes Wheelchair propulsion: Both upper extremities Wheelchair parts: Independent (arm rests; wheelchair does not have leg rests) Distance: 550  Modified Rankin (Stroke Patients Only)       Balance Overall balance assessment: Needs assistance Sitting-balance support: Feet unsupported, Single extremity supported Sitting balance-Leahy Scale: Good Sitting balance - Comments: sitting on EOB                                    Cognition Arousal/Alertness: Awake/alert Behavior During Therapy: WFL for tasks assessed/performed  Overall Cognitive Status: No family/caregiver present to determine baseline cognitive functioning Area of Impairment: Attention, Memory, Following commands, Safety/judgement, Problem solving, Awareness                    Current Attention Level: Sustained Memory: Decreased recall of precautions, Decreased short-term memory Following Commands: Follows multi-step commands consistently Safety/Judgement: Decreased awareness of deficits, Decreased awareness of safety Awareness: Emergent Problem Solving: Slow processing, Requires verbal cues, Requires tactile cues, Difficulty sequencing General Comments: Pt provided with verbal cues and visual demonstration of technique prior to attempting lateral scoot transfer from bed to w/c.        Exercises Other Exercises Other Exercises: Pt declined AROM LAQ or knee extension stretching    General Comments        Pertinent Vitals/Pain Pain Assessment Pain Assessment: Faces Faces Pain Scale: Hurts even more Pain Location: left residual limb Pain Descriptors / Indicators: Operative site guarding, Sore Pain Intervention(s): Limited activity within patient's tolerance, Monitored during session, Patient requesting pain meds-RN notified    Home Living                          Prior Function            PT Goals (current goals can now be found in the care plan section) Acute Rehab PT Goals Patient Stated Goal: Be able to transfer himself to/from the toilet, to/from the wheelchair PT Goal Formulation: With patient Time For Goal Achievement: 01/12/23 Potential to Achieve Goals: Good Progress towards PT goals: Progressing toward goals    Frequency    Min 1X/week      PT Plan Current plan remains appropriate    Co-evaluation              AM-PAC PT "6 Clicks" Mobility   Outcome Measure  Help needed turning from your back to your side while in a flat bed without using bedrails?: None Help needed moving from lying on your back to sitting on the side of a flat bed without using bedrails?: None Help needed moving to and from a bed to a chair (including a wheelchair)?: A Little Help needed standing up from a chair using your arms  (e.g., wheelchair or bedside chair)?: Total Help needed to walk in hospital room?: Total Help needed climbing 3-5 steps with a railing? : Total 6 Click Score: 14    End of Session Equipment Utilized During Treatment: Gait belt Activity Tolerance: Patient tolerated treatment well Patient left: Other (comment) (In wheelchair mobilizing around room) Nurse Communication: Mobility status;Other (comment) (Pt requests bed linen be changed prior to transferring back to bed) PT Visit Diagnosis: Muscle weakness (generalized) (M62.81);Difficulty in walking, not elsewhere classified (R26.2);Other abnormalities of gait and mobility (R26.89)     Time: 1856-3149 PT Time Calculation (min) (ACUTE ONLY): 43 min  Charges:  $Wheel Chair Management: 38-52 mins                     Rolinda Roan, PT, DPT Acute Rehabilitation Services Secure Chat Preferred Office: [phone]    [name] 01/05/2023, 1:05 PM

## 2023-01-05 NOTE — Plan of Care (Signed)
°  Problem: Fluid Volume: °Goal: Hemodynamic stability will improve °Outcome: Progressing °  °Problem: Clinical Measurements: °Goal: Diagnostic test results will improve °Outcome: Progressing °Goal: Signs and symptoms of infection will decrease °Outcome: Progressing °  °

## 2023-01-06 DIAGNOSIS — I10 Essential (primary) hypertension: Secondary | ICD-10-CM

## 2023-01-06 DIAGNOSIS — L0390 Cellulitis, unspecified: Secondary | ICD-10-CM

## 2023-01-06 DIAGNOSIS — S81809A Unspecified open wound, unspecified lower leg, initial encounter: Secondary | ICD-10-CM

## 2023-01-06 DIAGNOSIS — A419 Sepsis, unspecified organism: Principal | ICD-10-CM

## 2023-01-06 DIAGNOSIS — I5040 Unspecified combined systolic (congestive) and diastolic (congestive) heart failure: Secondary | ICD-10-CM

## 2023-01-06 DIAGNOSIS — L03116 Cellulitis of left lower limb: Secondary | ICD-10-CM | POA: Diagnosis not present

## 2023-01-06 DIAGNOSIS — I96 Gangrene, not elsewhere classified: Secondary | ICD-10-CM

## 2023-01-06 LAB — GLUCOSE, CAPILLARY
Glucose-Capillary: 120 mg/dL — ABNORMAL HIGH (ref 70–99)
Glucose-Capillary: 118 mg/dL — ABNORMAL HIGH (ref 70–99)
Glucose-Capillary: 90 mg/dL (ref 70–99)

## 2023-01-06 NOTE — Plan of Care (Signed)
Problem: Fluid Volume: Goal: Hemodynamic stability will improve 01/06/2023 1936 by Morene Rankins, LPN Outcome: Progressing 01/06/2023 1936 by Morene Rankins, LPN Outcome: Progressing   Problem: Clinical Measurements: Goal: Diagnostic test results will improve 01/06/2023 1936 by Morene Rankins, LPN Outcome: Progressing 01/06/2023 1936 by Morene Rankins, LPN Outcome: Progressing Goal: Signs and symptoms of infection will decrease 01/06/2023 1936 by Morene Rankins, LPN Outcome: Progressing 01/06/2023 1936 by Morene Rankins, LPN Outcome: Progressing   Problem: Respiratory: Goal: Ability to maintain adequate ventilation will improve 01/06/2023 1936 by Morene Rankins, LPN Outcome: Progressing 01/06/2023 1936 by Morene Rankins, LPN Outcome: Progressing   Problem: Education: Goal: Knowledge of General Education information will improve Description: Including pain rating scale, medication(s)/side effects and non-pharmacologic comfort measures 01/06/2023 1936 by Morene Rankins, LPN Outcome: Progressing 01/06/2023 1936 by Morene Rankins, LPN Outcome: Progressing   Problem: Health Behavior/Discharge Planning: Goal: Ability to manage health-related needs will improve 01/06/2023 1936 by Morene Rankins, LPN Outcome: Progressing 01/06/2023 1936 by Morene Rankins, LPN Outcome: Progressing   Problem: Clinical Measurements: Goal: Ability to maintain clinical measurements within normal limits will improve 01/06/2023 1936 by Morene Rankins, LPN Outcome: Progressing 01/06/2023 1936 by Morene Rankins, LPN Outcome: Progressing Goal: Will remain free from infection 01/06/2023 1936 by Morene Rankins, LPN Outcome: Progressing 01/06/2023 1936 by Morene Rankins, LPN Outcome: Progressing Goal: Diagnostic test results will improve 01/06/2023 1936 by Morene Rankins, LPN Outcome: Progressing 01/06/2023 1936 by Morene Rankins, LPN Outcome: Progressing Goal: Respiratory complications will  improve 01/06/2023 1936 by Morene Rankins, LPN Outcome: Progressing 01/06/2023 1936 by Morene Rankins, LPN Outcome: Progressing Goal: Cardiovascular complication will be avoided 01/06/2023 1936 by Morene Rankins, LPN Outcome: Progressing 01/06/2023 1936 by Morene Rankins, LPN Outcome: Progressing   Problem: Activity: Goal: Risk for activity intolerance will decrease 01/06/2023 1936 by Morene Rankins, LPN Outcome: Progressing 01/06/2023 1936 by Morene Rankins, LPN Outcome: Progressing   Problem: Nutrition: Goal: Adequate nutrition will be maintained 01/06/2023 1936 by Morene Rankins, LPN Outcome: Progressing 01/06/2023 1936 by Morene Rankins, LPN Outcome: Progressing   Problem: Coping: Goal: Level of anxiety will decrease 01/06/2023 1936 by Morene Rankins, LPN Outcome: Progressing 01/06/2023 1936 by Morene Rankins, LPN Outcome: Progressing   Problem: Elimination: Goal: Will not experience complications related to bowel motility 01/06/2023 1936 by Morene Rankins, LPN Outcome: Progressing 01/06/2023 1936 by Morene Rankins, LPN Outcome: Progressing Goal: Will not experience complications related to urinary retention 01/06/2023 1936 by Morene Rankins, LPN Outcome: Progressing 01/06/2023 1936 by Morene Rankins, LPN Outcome: Progressing   Problem: Pain Managment: Goal: General experience of comfort will improve 01/06/2023 1936 by Morene Rankins, LPN Outcome: Progressing 01/06/2023 1936 by Morene Rankins, LPN Outcome: Progressing   Problem: Safety: Goal: Ability to remain free from injury will improve 01/06/2023 1936 by Morene Rankins, LPN Outcome: Progressing 01/06/2023 1936 by Morene Rankins, LPN Outcome: Progressing   Problem: Skin Integrity: Goal: Risk for impaired skin integrity will decrease 01/06/2023 1936 by Morene Rankins, LPN Outcome: Progressing 01/06/2023 1936 by Morene Rankins, LPN Outcome: Progressing   Problem: Education: Goal: Knowledge of the  prescribed therapeutic regimen will improve 01/06/2023 1936 by Morene Rankins, LPN Outcome: Progressing 01/06/2023 1936 by Morene Rankins, LPN Outcome: Progressing Goal: Ability to verbalize activity precautions or restrictions will improve 01/06/2023 1936 by Morene Rankins, LPN Outcome: Progressing  01/06/2023 1936 by Morene Rankins, LPN Outcome: Progressing Goal: Understanding of discharge needs will improve 01/06/2023 1936 by Morene Rankins, LPN Outcome: Progressing 01/06/2023 1936 by Morene Rankins, LPN Outcome: Progressing   Problem: Activity: Goal: Ability to perform//tolerate increased activity and mobilize with assistive devices will improve 01/06/2023 1936 by Morene Rankins, LPN Outcome: Progressing 01/06/2023 1936 by Morene Rankins, LPN Outcome: Progressing   Problem: Clinical Measurements: Goal: Postoperative complications will be avoided or minimized 01/06/2023 1936 by Morene Rankins, LPN Outcome: Progressing 01/06/2023 1936 by Morene Rankins, LPN Outcome: Progressing   Problem: Self-Care: Goal: Ability to meet self-care needs will improve 01/06/2023 1936 by Morene Rankins, LPN Outcome: Progressing 01/06/2023 1936 by Morene Rankins, LPN Outcome: Progressing   Problem: Self-Concept: Goal: Ability to maintain and perform role responsibilities to the fullest extent possible will improve 01/06/2023 1936 by Morene Rankins, LPN Outcome: Progressing 01/06/2023 1936 by Morene Rankins, LPN Outcome: Progressing   Problem: Pain Management: Goal: Pain level will decrease with appropriate interventions 01/06/2023 1936 by Morene Rankins, LPN Outcome: Progressing 01/06/2023 1936 by Morene Rankins, LPN Outcome: Progressing   Problem: Skin Integrity: Goal: Demonstration of wound healing without infection will improve 01/06/2023 1936 by Morene Rankins, LPN Outcome: Progressing 01/06/2023 1936 by Morene Rankins, LPN Outcome: Progressing   Problem: Education: Goal:  Knowledge of General Education information will improve Description: Including pain rating scale, medication(s)/side effects and non-pharmacologic comfort measures 01/06/2023 1936 by Morene Rankins, LPN Outcome: Progressing 01/06/2023 1936 by Morene Rankins, LPN Outcome: Progressing   Problem: Health Behavior/Discharge Planning: Goal: Ability to manage health-related needs will improve 01/06/2023 1936 by Morene Rankins, LPN Outcome: Progressing 01/06/2023 1936 by Morene Rankins, LPN Outcome: Progressing   Problem: Clinical Measurements: Goal: Ability to maintain clinical measurements within normal limits will improve 01/06/2023 1936 by Morene Rankins, LPN Outcome: Progressing 01/06/2023 1936 by Morene Rankins, LPN Outcome: Progressing Goal: Will remain free from infection 01/06/2023 1936 by Morene Rankins, LPN Outcome: Progressing 01/06/2023 1936 by Morene Rankins, LPN Outcome: Progressing Goal: Diagnostic test results will improve 01/06/2023 1936 by Morene Rankins, LPN Outcome: Progressing 01/06/2023 1936 by Morene Rankins, LPN Outcome: Progressing Goal: Respiratory complications will improve 01/06/2023 1936 by Morene Rankins, LPN Outcome: Progressing 01/06/2023 1936 by Morene Rankins, LPN Outcome: Progressing Goal: Cardiovascular complication will be avoided 01/06/2023 1936 by Morene Rankins, LPN Outcome: Progressing 01/06/2023 1936 by Morene Rankins, LPN Outcome: Progressing   Problem: Activity: Goal: Risk for activity intolerance will decrease 01/06/2023 1936 by Morene Rankins, LPN Outcome: Progressing 01/06/2023 1936 by Morene Rankins, LPN Outcome: Progressing   Problem: Nutrition: Goal: Adequate nutrition will be maintained 01/06/2023 1936 by Morene Rankins, LPN Outcome: Progressing 01/06/2023 1936 by Morene Rankins, LPN Outcome: Progressing   Problem: Coping: Goal: Level of anxiety will decrease 01/06/2023 1936 by Morene Rankins, LPN Outcome:  Progressing 01/06/2023 1936 by Morene Rankins, LPN Outcome: Progressing   Problem: Elimination: Goal: Will not experience complications related to bowel motility 01/06/2023 1936 by Morene Rankins, LPN Outcome: Progressing 01/06/2023 1936 by Morene Rankins, LPN Outcome: Progressing Goal: Will not experience complications related to urinary retention 01/06/2023 1936 by Morene Rankins, LPN Outcome: Progressing 01/06/2023 1936 by Morene Rankins, LPN Outcome: Progressing   Problem: Pain Managment: Goal: General experience of comfort will improve 01/06/2023 1936 by Morene Rankins, LPN Outcome: Progressing 01/06/2023 1936  by Morene Rankins, LPN Outcome: Progressing   Problem: Safety: Goal: Ability to remain free from injury will improve 01/06/2023 1936 by Morene Rankins, LPN Outcome: Progressing 01/06/2023 1936 by Morene Rankins, LPN Outcome: Progressing   Problem: Skin Integrity: Goal: Risk for impaired skin integrity will decrease 01/06/2023 1936 by Morene Rankins, LPN Outcome: Progressing 01/06/2023 1936 by Morene Rankins, LPN Outcome: Progressing

## 2023-01-06 NOTE — Plan of Care (Signed)
  Problem: Fluid Volume: Goal: Hemodynamic stability will improve Outcome: Progressing   

## 2023-01-06 NOTE — Progress Notes (Signed)
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 PMH includes asthma, bioplar 1, depression, schizoaffective disorder, CVA, chronic CHF, homelessness, HTN, DM2, cocaine and cannabis use, and R AKA.   OT comments  Pt seated EOB upon therapy arrival and agreeable to participate in treatment session focusing on AP transfers versus lateral transfer. Pt provided with visual demonstration of technique via Youtube video. Pt able to demonstrate AP transfer from bed to w/c and from w/c to toilet as well. Pt declined completing AP transfer back to bed from w/c. Reports that he did not want to keep doing something over and over.    Recommendations for follow up therapy are one component of a multi-disciplinary discharge planning process, led by the attending physician.  Recommendations may be updated based on patient status, additional functional criteria and insurance authorization.    Follow Up Recommendations  Skilled nursing-short term rehab (<3 hours/day)     Assistance Recommended at Discharge PRN  Patient can return home with the following  A little help with walking and/or transfers;A little help with bathing/dressing/bathroom;Help with stairs or ramp for entrance;Assist for transportation   Equipment Recommendations  Other (comment) (defer to next venue of care)       Precautions / Restrictions Precautions Precautions: Fall Precaution Comments: R AKA, L BKA Restrictions Weight Bearing Restrictions: No RLE Weight Bearing: Non weight bearing LLE Weight Bearing: Non weight bearing       Mobility Bed Mobility Overal bed mobility: Needs Assistance Bed Mobility: Supine to Sit     Supine to sit: Modified independent (Device/Increase time)       Patient Response:  Cooperative  Transfers Overall transfer level: Needs assistance Equipment used: None Transfers: Bed to chair/wheelchair/BSC         Anterior-Posterior transfers: Supervision (OT stabilized w/c for safety)   General transfer comment: Pt declined completing posterior transfer from w/c to bed.         ADL either performed or assessed with clinical judgement   ADL         Toilet Transfer: Supervision/safety;Anterior/posterior;Regular Toilet;Grab bars                                     Pertinent Vitals/ Pain       Pain Assessment Pain Assessment: Faces Faces Pain Scale: No hurt         Frequency  Min 2X/week        Progress Toward Goals  OT Goals(current goals can now be found in the care plan section)  Progress towards OT goals: Progressing toward goals;OT to reassess next treatment     Plan Discharge plan remains appropriate;Frequency remains appropriate       AM-PAC OT "6 Clicks" Daily Activity     Outcome Measure   Help from another person eating meals?: None   Help from another person toileting, which includes using toliet, bedpan, or urinal?: None Help from another person bathing (including washing, rinsing, drying)?: A Little Help from another person to put on and taking off regular upper body clothing?: None Help from another person to put on and taking off regular lower body clothing?: A Little 6 Click Score: 18    End of Session    OT Visit Diagnosis:  Muscle weakness (generalized) (M62.81);Pain Pain - Right/Left: Left Pain - part of body: Leg   Activity Tolerance Patient tolerated treatment well   Patient Left in chair (personal wheelchair)           Time: 1638-4536 OT Time Calculation (min): 13 min  Charges: OT General Charges $OT Visit: 1 Visit OT Treatments $Self Care/Home Management : 8-22 mins  Ailene Ravel, OTR/L,CBIS  Supplemental OT - [facility] and [facility] Secure Chat Preferred    Pio Eatherly, [name] 01/06/2023, 5:05 PM

## 2023-01-06 NOTE — Progress Notes (Signed)
[facility]  PROGRESS NOTE  [name] DGU:[id number] DOB: 22-Jun-1966 DOA: 12/07/2022 PCP: Marliss Coots, NP   Brief HPI:   57 year old male with past medical history of chronic combined CHF, CAD, hyperlipidemia, polyneuropathy, bipolar disorder, COPD, homelessness presented with lower extremity chronic venous stasis wound with concern for superimposed infection.  He was seen by orthopedic surgery and was recommended for ongoing left BKA.  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  Currently awaiting skilled nursing facility placement.    Subjective   Patient seen and examined, denies pain.   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 Sharol Given -Started on doxycycline for 2 weeks; stop date 01/09/23 -PT consulted, recommended skilled nursing facility; [facility] consulted -Pain control with Neurontin,  20 mg of oxycodone every 4 hours as needed -Ortho to follow-up after discharge to remove the staples; recommend to wear compression sock in the left stump all the time  Hypertension -Blood pressure is well-controlled -Continue as needed hydralazine -Continue Imdur, amlodipine, Lasix  Chronic combined CHF -Appears well compensated -Continue Lasix 40 mg p.o. daily   COPD -Stable  Anxiety -Continue Atarax as needed  GERD -Continue PPI  Constipation -Continue senna, Colace, MiraLAX    Medications     amLODipine  10 mg Oral Daily   vitamin C  1,000 mg Oral Daily   collagenase   Topical Daily   docusate sodium  100 mg Oral Daily   doxycycline  100 mg Oral Z56L   folic acid  1 mg Oral Daily   furosemide  40 mg Oral Daily   gabapentin  900 mg Oral TID   isosorbide mononitrate  15 mg Oral Daily   multivitamin with minerals  1 tablet Oral Daily   pantoprazole  40 mg Oral Daily   senna-docusate  1 tablet Oral BID   thiamine   100 mg Oral Daily     Data Reviewed:   CBG:  Recent Labs  Lab 12/31/22 1646 01/05/23 0736 01/05/23 1127 01/05/23 1633 01/06/23 0829  GLUCAP 109* 160* 95 112* 120*    SpO2: 100 % O2 Flow Rate (L/min): 2 L/min    Vitals:   01/05/23 0735 01/05/23 1444 01/05/23 2307 01/06/23 0828  BP: 123/69 105/73 (!) 143/93 (!) 137/99  Pulse: 78 75 81 87  Resp:   18 17  Temp: 97.9 F (36.6 C) 97.7 F (36.5 C) 97.7 F (36.5 C) (!) 97.5 F (36.4 C)  TempSrc: Oral Oral Oral Oral  SpO2: 100% 99%  100%  Weight:      Height:          Data Reviewed:  Basic Metabolic Panel: Recent Labs  Lab 01/02/23 1034 01/05/23 0240  NA 139 135  K 3.8 4.3  CL 104 103  CO2 25 23  GLUCOSE 151* 103*  BUN 16 18  CREATININE 0.90 0.85  CALCIUM 9.3 9.2    CBC: Recent Labs  Lab 01/02/23 1034  WBC 5.4  HGB 13.5  HCT 41.8  MCV 90.9  PLT 340    LFT No results for input(s): "AST", "ALT", "ALKPHOS", "BILITOT", "PROT", "ALBUMIN" in the last 168 hours.   Antibiotics: Anti-infectives (From admission, onward)    Start     Dose/Rate Route Frequency Ordered Stop   12/26/22 2200  doxycycline (VIBRA-TABS) tablet 100 mg        100 mg Oral Every 12 hours 12/26/22 1307  01/09/23 2159   12/24/22 1000  doxycycline (VIBRA-TABS) tablet 100 mg  Status:  Discontinued        100 mg Oral Every 12 hours 12/24/22 0715 12/26/22 1307   12/10/22 1830  ceFAZolin (ANCEF) IVPB 2g/100 mL premix  Status:  Discontinued        2 g 200 mL/hr over 30 Minutes Intravenous Every 8 hours 12/10/22 1731 12/10/22 1740   12/10/22 1330  ceFAZolin (ANCEF) IVPB 2g/100 mL premix        2 g 200 mL/hr over 30 Minutes Intravenous On call to O.R. 12/10/22 0959 12/10/22 1534   12/08/22 0530  vancomycin (VANCOREADY) IVPB 1500 mg/300 mL        1,500 mg 150 mL/hr over 120 Minutes Intravenous Every 12 hours 12/07/22 1741 12/11/22 0955   12/08/22 0345  metroNIDAZOLE (FLAGYL) IVPB 500 mg        500 mg 100 mL/hr over 60 Minutes Intravenous  Every 12 hours 12/07/22 1742 12/12/22 0344   12/07/22 2330  ceFEPIme (MAXIPIME) 2 g in sodium chloride 0.9 % 100 mL IVPB        2 g 200 mL/hr over 30 Minutes Intravenous Every 8 hours 12/07/22 1741 12/11/22 1529   12/07/22 2200  metroNIDAZOLE (FLAGYL) IVPB 500 mg  Status:  Discontinued        500 mg 100 mL/hr over 60 Minutes Intravenous Every 12 hours 12/07/22 2048 12/07/22 2056   12/07/22 1530  ceFEPIme (MAXIPIME) 2 g in sodium chloride 0.9 % 100 mL IVPB        2 g 200 mL/hr over 30 Minutes Intravenous  Once 12/07/22 1516 12/07/22 1734   12/07/22 1530  vancomycin (VANCOREADY) IVPB 1500 mg/300 mL        1,500 mg 150 mL/hr over 120 Minutes Intravenous  Once 12/07/22 1516 12/09/22 0333   12/07/22 1515  metroNIDAZOLE (FLAGYL) IVPB 500 mg        500 mg 100 mL/hr over 60 Minutes Intravenous  Once 12/07/22 1506 12/07/22 1734   12/07/22 1500  ceFAZolin (ANCEF) IVPB 1 g/50 mL premix  Status:  Discontinued        1 g 100 mL/hr over 30 Minutes Intravenous  Once 12/07/22 1452 12/07/22 1513        DVT prophylaxis: SCDs  Code Status: Full code  Family Communication: No family at bedside   CONSULTS orthopedics   Objective    Physical Examination:  Appears in no acute distress S1-S2, regular, no murmur auscultated Lungs clear to auscultation bilaterally Abdomen is soft, nontender, no organomegaly Extremities-left BKA, right AKA  Status is: Inpatient:             [name]   [facility] If 7PM-7AM, please contact night-coverage at [email or web address], Office  [phone]   01/06/2023, 9:33 AM  LOS: 30 days

## 2023-01-07 DIAGNOSIS — I96 Gangrene, not elsewhere classified: Secondary | ICD-10-CM | POA: Diagnosis not present

## 2023-01-07 LAB — GLUCOSE, CAPILLARY
Glucose-Capillary: 84 mg/dL (ref 70–99)
Glucose-Capillary: 133 mg/dL — ABNORMAL HIGH (ref 70–99)
Glucose-Capillary: 92 mg/dL (ref 70–99)
Glucose-Capillary: 151 mg/dL — ABNORMAL HIGH (ref 70–99)

## 2023-01-07 NOTE — Progress Notes (Signed)
PROGRESS NOTE    [name]  ZOX:[id number] DOB: 1966-08-17 DOA: 12/07/2022 PCP: Marliss Coots, NP    Brief Narrative:  57 year old male with past medical history of chronic combined CHF, CAD, hyperlipidemia, polyneuropathy, bipolar disorder, COPD, homelessness presented with lower extremity chronic venous stasis wound with concern for superimposed infection. He was seen by orthopedic surgery and was recommended for ongoing left BKA. 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 Currently awaiting skilled nursing facility placement.     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 Sharol Given -Started on doxycycline for 2 weeks; stop date 01/09/23 -PT consulted, recommended skilled nursing facility; [facility] consulted but having issues with placement -Pain control with Neurontin,  20 mg of oxycodone every 4 hours as needed -Ortho to follow-up after discharge to remove the staples; recommend to wear compression sock in the left stump all the time   Hypertension -Blood pressure is well-controlled -Continue as needed hydralazine -Continue Imdur, amlodipine, Lasix   Chronic combined CHF -Appears well compensated -Continue Lasix 40 mg p.o. daily   COPD -Stable   Anxiety -Continue Atarax as needed   GERD -Continue PPI   Constipation -Continue senna, Colace, MiraLAX    DVT prophylaxis: SCD's Start: 12/10/22 1731    Code Status: Full Code  Disposition Plan:  Level of care: Telemetry Surgical Status is: Inpatient Remains inpatient appropriate because: needs placement    Consultants:  ortho   Subjective: No current complaints  Objective: Vitals:   01/06/23 0828 01/06/23 1620 01/06/23 2234 01/07/23 0457  BP: (!) 137/99 (!) 124/110 (!) 145/100 123/83  Pulse: 87 90 95 77  Resp: 17  19 18   Temp: (!) 97.5 F (36.4 C) 98.1 F (36.7 C) 98  F (36.7 C) 98 F (36.7 C)  TempSrc: Oral Oral Oral Oral  SpO2: 100% 95% 98% 100%  Weight:      Height:        Intake/Output Summary (Last 24 hours) at 01/07/2023 0936 Last data filed at 01/07/2023 8119 Gross per 24 hour  Intake 240 ml  Output 1150 ml  Net -910 ml   Filed Weights   12/28/22 0500 12/29/22 0438 01/05/23 0539  Weight: 81.3 kg 81.3 kg 82 kg    Examination:   General: Appearance:     Overweight male in no acute distress     Lungs:     respirations unlabored  Heart:    Normal heart rate.      Neurologic:   Awake, alert, oriented x 3. No apparent focal neurological           defect.        Data Reviewed: I have personally reviewed following labs and imaging studies  CBC: Recent Labs  Lab 01/02/23 1034  WBC 5.4  HGB 13.5  HCT 41.8  MCV 90.9  PLT 147   Basic Metabolic Panel: Recent Labs  Lab 01/02/23 1034 01/05/23 0240  NA 139 135  K 3.8 4.3  CL 104 103  CO2 25 23  GLUCOSE 151* 103*  BUN 16 18  CREATININE 0.90 0.85  CALCIUM 9.3 9.2   GFR: Estimated Creatinine Clearance: 99.3 mL/min (by C-G formula based on SCr of 0.85 mg/dL). Liver Function Tests: No results for input(s): "AST", "ALT", "ALKPHOS", "BILITOT", "PROT", "ALBUMIN" in the last 168 hours. No results for input(s): "LIPASE", "AMYLASE" in the last 168 hours. No results for input(s): "AMMONIA" in  the last 168 hours. Coagulation Profile: No results for input(s): "INR", "PROTIME" in the last 168 hours. Cardiac Enzymes: No results for input(s): "CKTOTAL", "CKMB", "CKMBINDEX", "TROPONINI" in the last 168 hours. BNP (last 3 results) No results for input(s): "PROBNP" in the last 8760 hours. HbA1C: No results for input(s): "HGBA1C" in the last 72 hours. CBG: Recent Labs  Lab 01/05/23 1633 01/06/23 0829 01/06/23 1058 01/06/23 1618 01/07/23 0747  GLUCAP 112* 120* 118* 90 84   Lipid Profile: No results for input(s): "CHOL", "HDL", "LDLCALC", "TRIG", "CHOLHDL", "LDLDIRECT" in the  last 72 hours. Thyroid Function Tests: No results for input(s): "TSH", "T4TOTAL", "FREET4", "T3FREE", "THYROIDAB" in the last 72 hours. Anemia Panel: No results for input(s): "VITAMINB12", "FOLATE", "FERRITIN", "TIBC", "IRON", "RETICCTPCT" in the last 72 hours. Sepsis Labs: No results for input(s): "PROCALCITON", "LATICACIDVEN" in the last 168 hours.  No results found for this or any previous visit (from the past 240 hour(s)).       Radiology Studies: No results found.      Scheduled Meds:  amLODipine  10 mg Oral Daily   vitamin C  1,000 mg Oral Daily   collagenase   Topical Daily   docusate sodium  100 mg Oral Daily   doxycycline  100 mg Oral S85I   folic acid  1 mg Oral Daily   furosemide  40 mg Oral Daily   gabapentin  900 mg Oral TID   isosorbide mononitrate  15 mg Oral Daily   multivitamin with minerals  1 tablet Oral Daily   pantoprazole  40 mg Oral Daily   senna-docusate  1 tablet Oral BID   thiamine  100 mg Oral Daily   Continuous Infusions:  magnesium sulfate bolus IVPB       LOS: 31 days    Time spent: 45 minutes spent on chart review, discussion with nursing staff, consultants, updating family and interview/physical exam; more than 50% of that time was spent in counseling and/or coordination of care.    Geradine Girt, DO [facility] Available via Epic secure chat 7am-7pm After these hours, please refer to coverage provider listed on [email or web address] 01/07/2023, 9:36 AM

## 2023-01-07 NOTE — Progress Notes (Signed)
Mobility Specialist Progress Note    01/07/23 1100  Mobility  Activity  (wheelchair mobility)  Level of Assistance Contact guard assist, steadying assist  Assistive Device Wheelchair  Distance Ambulated (ft) 1000 ft  Activity Response Tolerated well  Mobility Referral Yes  $Mobility charge 1 Mobility   Pt received and agreeable. No complaints. Pt turned himself sideways in chair and used BUE to push himself up and over the wheel and scoot back onto bed. PT requested MS only hold onto WC to steady. Left sitting EOB w/ call bell in reach.   Hildred Alamin Mobility Specialist  Please Psychologist, sport and exercise or Rehab Office at [phone]

## 2023-01-07 NOTE — Plan of Care (Signed)
  Problem: Fluid Volume: Goal: Hemodynamic stability will improve Outcome: Progressing   Problem: Clinical Measurements: Goal: Diagnostic test results will improve Outcome: Progressing Goal: Signs and symptoms of infection will decrease Outcome: Progressing   Problem: Respiratory: Goal: Ability to maintain adequate ventilation will improve Outcome: Progressing   Problem: Education: Goal: Knowledge of General Education information will improve Description: Including pain rating scale, medication(s)/side effects and non-pharmacologic comfort measures Outcome: Progressing   Problem: Health Behavior/Discharge Planning: Goal: Ability to manage health-related needs will improve Outcome: Progressing   Problem: Clinical Measurements: Goal: Ability to maintain clinical measurements within normal limits will improve Outcome: Progressing Goal: Will remain free from infection Outcome: Progressing Goal: Diagnostic test results will improve Outcome: Progressing Goal: Respiratory complications will improve Outcome: Progressing Goal: Cardiovascular complication will be avoided Outcome: Progressing   Problem: Activity: Goal: Risk for activity intolerance will decrease Outcome: Progressing   Problem: Nutrition: Goal: Adequate nutrition will be maintained Outcome: Progressing   Problem: Coping: Goal: Level of anxiety will decrease Outcome: Progressing   Problem: Elimination: Goal: Will not experience complications related to bowel motility Outcome: Progressing Goal: Will not experience complications related to urinary retention Outcome: Progressing   Problem: Pain Managment: Goal: General experience of comfort will improve Outcome: Progressing   Problem: Safety: Goal: Ability to remain free from injury will improve Outcome: Progressing   Problem: Skin Integrity: Goal: Risk for impaired skin integrity will decrease Outcome: Progressing   Problem: Education: Goal:  Knowledge of the prescribed therapeutic regimen will improve Outcome: Progressing Goal: Ability to verbalize activity precautions or restrictions will improve Outcome: Progressing Goal: Understanding of discharge needs will improve Outcome: Progressing   Problem: Activity: Goal: Ability to perform//tolerate increased activity and mobilize with assistive devices will improve Outcome: Progressing   Problem: Clinical Measurements: Goal: Postoperative complications will be avoided or minimized Outcome: Progressing   Problem: Self-Care: Goal: Ability to meet self-care needs will improve Outcome: Progressing   Problem: Self-Concept: Goal: Ability to maintain and perform role responsibilities to the fullest extent possible will improve Outcome: Progressing   Problem: Pain Management: Goal: Pain level will decrease with appropriate interventions Outcome: Progressing   Problem: Skin Integrity: Goal: Demonstration of wound healing without infection will improve Outcome: Progressing   Problem: Education: Goal: Knowledge of General Education information will improve Description: Including pain rating scale, medication(s)/side effects and non-pharmacologic comfort measures Outcome: Progressing   Problem: Health Behavior/Discharge Planning: Goal: Ability to manage health-related needs will improve Outcome: Progressing   Problem: Clinical Measurements: Goal: Ability to maintain clinical measurements within normal limits will improve Outcome: Progressing Goal: Will remain free from infection Outcome: Progressing Goal: Diagnostic test results will improve Outcome: Progressing Goal: Respiratory complications will improve Outcome: Progressing Goal: Cardiovascular complication will be avoided Outcome: Progressing   Problem: Activity: Goal: Risk for activity intolerance will decrease Outcome: Progressing   Problem: Nutrition: Goal: Adequate nutrition will be maintained Outcome:  Progressing   Problem: Coping: Goal: Level of anxiety will decrease Outcome: Progressing   Problem: Elimination: Goal: Will not experience complications related to bowel motility Outcome: Progressing Goal: Will not experience complications related to urinary retention Outcome: Progressing   Problem: Pain Managment: Goal: General experience of comfort will improve Outcome: Progressing   Problem: Safety: Goal: Ability to remain free from injury will improve Outcome: Progressing   Problem: Skin Integrity: Goal: Risk for impaired skin integrity will decrease Outcome: Progressing   

## 2023-01-08 DIAGNOSIS — I96 Gangrene, not elsewhere classified: Secondary | ICD-10-CM | POA: Diagnosis not present

## 2023-01-08 LAB — GLUCOSE, CAPILLARY
Glucose-Capillary: 105 mg/dL — ABNORMAL HIGH (ref 70–99)
Glucose-Capillary: 103 mg/dL — ABNORMAL HIGH (ref 70–99)
Glucose-Capillary: 105 mg/dL — ABNORMAL HIGH (ref 70–99)

## 2023-01-08 MED ORDER — MELATONIN 3 MG PO TABS
3.0000 mg | ORAL_TABLET | Freq: Every day | ORAL | Status: DC
  Administered 2023-01-08 – 2023-05-14 (×13): 3 mg via ORAL
  Filled 2023-01-08 (×51): qty 1

## 2023-01-08 NOTE — Progress Notes (Signed)
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 PMH includes asthma, bioplar 1, depression, schizoaffective disorder, CVA, chronic CHF, homelessness, HTN, DM2, cocaine and cannabis use, and R AKA.   OT comments  Progressing towards established OT goals. Due for goal update and pt reporting he would like to be able to transfer to wheelchair without stabilization of chair from therapist as does require up to min stabilization to avoid tipping chair. Challenging transfers and activity tolerance with 2 laps around unit and transfers from w/c <>bed. Will continue to follow acutely. Goals updated.    Recommendations for follow up therapy are one component of a multi-disciplinary discharge planning process, led by the attending physician.  Recommendations may be updated based on patient status, additional functional criteria and insurance authorization.    Follow Up Recommendations  Skilled nursing-short term rehab (<3 hours/day)     Assistance Recommended at Discharge PRN  Patient can return home with the following  A little help with walking and/or transfers;A little help with bathing/dressing/bathroom;Help with stairs or ramp for entrance;Assist for transportation   Equipment Recommendations  Other (comment) (defer next venue)    Recommendations for Other Services      Precautions / Restrictions Precautions Precautions: Fall Precaution Comments: R AKA, L BKA Restrictions Weight Bearing Restrictions: No RLE Weight Bearing: Non weight bearing LLE Weight Bearing: Non weight bearing       Mobility Bed Mobility Overal bed mobility: Needs Assistance Bed Mobility: Supine to Sit     Supine to sit: Modified independent (Device/Increase time)     General bed mobility comments: Pt able to  transition to EOB without assistance and scoot out to sit with L residual limb dangling.    Transfers Overall transfer level: Needs assistance Equipment used: None Transfers: Bed to chair/wheelchair/BSC         Anterior-Posterior transfers: Supervision, Min assist (OT satbilizing wheelchair)   General transfer comment: Assist back to bed for stabilization of chair only     Balance Overall balance assessment: Needs assistance Sitting-balance support: Feet unsupported, Single extremity supported Sitting balance-Leahy Scale: Good Sitting balance - Comments: sitting on EOB                                   ADL either performed or assessed with clinical judgement   ADL Overall ADL's : Needs assistance/impaired Eating/Feeding: Independent;Sitting Eating/Feeding Details (indicate cue type and reason): eating ice pop at the end of session and drinking from cup                     Toilet Transfer: Minimal assistance (A/P scoot simulated to chair) Toilet Transfer Details (indicate cue type and reason): OT stabilizing chair providing min A for stabilizing only and verbal cues to maintain precautions         Functional mobility during ADLs: Modified independent;Wheelchair (increased time to self propel in chair)      Extremity/Trunk Assessment Upper Extremity Assessment Upper Extremity Assessment: Generalized weakness   Lower Extremity Assessment Lower Extremity Assessment: Defer to PT evaluation        Vision   Vision Assessment?: No apparent visual deficits   Perception     Praxis      Cognition Arousal/Alertness: Awake/alert Behavior  During Therapy: WFL for tasks assessed/performed Overall Cognitive Status: No family/caregiver present to determine baseline cognitive functioning Area of Impairment: Attention, Memory, Following commands, Safety/judgement, Problem solving, Awareness                   Current Attention Level:  Sustained Memory: Decreased recall of precautions, Decreased short-term memory Following Commands: Follows multi-step commands consistently Safety/Judgement: Decreased awareness of deficits, Decreased awareness of safety Awareness: Emergent Problem Solving: Slow processing, Requires verbal cues, Requires tactile cues, Difficulty sequencing General Comments: Pt very aware of current need for assist to steady chair during transfers; asking if he was at [facility] as if he was unsure. Pt would like to become more independent with transfers.        Exercises Exercises: Other exercises Other Exercises Other Exercises: chair push ups EOB    Shoulder Instructions       General Comments      Pertinent Vitals/ Pain       Pain Assessment Pain Assessment: Faces Faces Pain Scale: Hurts little more Pain Location: left residual limb Pain Descriptors / Indicators: Operative site guarding, Sore Pain Intervention(s): Limited activity within patient's tolerance, Monitored during session  Home Living                                          Prior Functioning/Environment              Frequency  Min 1X/week        Progress Toward Goals  OT Goals(current goals can now be found in the care plan section)  Progress towards OT goals: Progressing toward goals  Acute Rehab OT Goals Patient Stated Goal: to to rehab OT Goal Formulation: With patient Time For Goal Achievement: 01/22/23 Potential to Achieve Goals: Good  Plan Discharge plan remains appropriate;Frequency remains appropriate    Co-evaluation                 AM-PAC OT "6 Clicks" Daily Activity     Outcome Measure   Help from another person eating meals?: None Help from another person taking care of personal grooming?: None Help from another person toileting, which includes using toliet, bedpan, or urinal?: None Help from another person bathing (including washing, rinsing, drying)?: A Little Help from  another person to put on and taking off regular upper body clothing?: None Help from another person to put on and taking off regular lower body clothing?: A Little 6 Click Score: 22    End of Session Equipment Utilized During Treatment: Other (comment) (wheelchair)  OT Visit Diagnosis: Muscle weakness (generalized) (M62.81);Pain Pain - Right/Left: Left Pain - part of body: Leg   Activity Tolerance Patient tolerated treatment well   Patient Left in bed   Nurse Communication Mobility status        Time: 0254-2706 OT Time Calculation (min): 27 min  Charges: OT General Charges $OT Visit: 1 Visit OT Treatments $Self Care/Home Management : 8-22 mins $Therapeutic Activity: 8-22 mins  [name], [name] [facility] Acute Rehabilitation Office: [phone]   [name] 01/08/2023, 4:47 PM

## 2023-01-08 NOTE — Plan of Care (Signed)
  Problem: Fluid Volume: Goal: Hemodynamic stability will improve Outcome: Progressing   Problem: Clinical Measurements: Goal: Diagnostic test results will improve Outcome: Progressing Goal: Signs and symptoms of infection will decrease Outcome: Progressing   Problem: Respiratory: Goal: Ability to maintain adequate ventilation will improve Outcome: Progressing   Problem: Education: Goal: Knowledge of General Education information will improve Description: Including pain rating scale, medication(s)/side effects and non-pharmacologic comfort measures Outcome: Progressing   Problem: Health Behavior/Discharge Planning: Goal: Ability to manage health-related needs will improve Outcome: Progressing   Problem: Clinical Measurements: Goal: Ability to maintain clinical measurements within normal limits will improve Outcome: Progressing Goal: Will remain free from infection Outcome: Progressing Goal: Diagnostic test results will improve Outcome: Progressing Goal: Respiratory complications will improve Outcome: Progressing Goal: Cardiovascular complication will be avoided Outcome: Progressing   Problem: Activity: Goal: Risk for activity intolerance will decrease Outcome: Progressing   Problem: Nutrition: Goal: Adequate nutrition will be maintained Outcome: Progressing   Problem: Coping: Goal: Level of anxiety will decrease Outcome: Progressing   Problem: Elimination: Goal: Will not experience complications related to bowel motility Outcome: Progressing Goal: Will not experience complications related to urinary retention Outcome: Progressing   Problem: Pain Managment: Goal: General experience of comfort will improve Outcome: Progressing   Problem: Safety: Goal: Ability to remain free from injury will improve Outcome: Progressing   Problem: Skin Integrity: Goal: Risk for impaired skin integrity will decrease Outcome: Progressing   Problem: Education: Goal:  Knowledge of the prescribed therapeutic regimen will improve Outcome: Progressing Goal: Ability to verbalize activity precautions or restrictions will improve Outcome: Progressing Goal: Understanding of discharge needs will improve Outcome: Progressing   Problem: Activity: Goal: Ability to perform//tolerate increased activity and mobilize with assistive devices will improve Outcome: Progressing   Problem: Clinical Measurements: Goal: Postoperative complications will be avoided or minimized Outcome: Progressing   Problem: Self-Care: Goal: Ability to meet self-care needs will improve Outcome: Progressing   Problem: Self-Concept: Goal: Ability to maintain and perform role responsibilities to the fullest extent possible will improve Outcome: Progressing   Problem: Pain Management: Goal: Pain level will decrease with appropriate interventions Outcome: Progressing   Problem: Skin Integrity: Goal: Demonstration of wound healing without infection will improve Outcome: Progressing   Problem: Education: Goal: Knowledge of General Education information will improve Description: Including pain rating scale, medication(s)/side effects and non-pharmacologic comfort measures Outcome: Progressing   Problem: Health Behavior/Discharge Planning: Goal: Ability to manage health-related needs will improve Outcome: Progressing   Problem: Clinical Measurements: Goal: Ability to maintain clinical measurements within normal limits will improve Outcome: Progressing Goal: Will remain free from infection Outcome: Progressing Goal: Diagnostic test results will improve Outcome: Progressing Goal: Respiratory complications will improve Outcome: Progressing Goal: Cardiovascular complication will be avoided Outcome: Progressing   Problem: Activity: Goal: Risk for activity intolerance will decrease Outcome: Progressing   Problem: Nutrition: Goal: Adequate nutrition will be maintained Outcome:  Progressing   Problem: Coping: Goal: Level of anxiety will decrease Outcome: Progressing   Problem: Elimination: Goal: Will not experience complications related to bowel motility Outcome: Progressing Goal: Will not experience complications related to urinary retention Outcome: Progressing   Problem: Pain Managment: Goal: General experience of comfort will improve Outcome: Progressing   Problem: Safety: Goal: Ability to remain free from injury will improve Outcome: Progressing   Problem: Skin Integrity: Goal: Risk for impaired skin integrity will decrease Outcome: Progressing

## 2023-01-08 NOTE — Progress Notes (Signed)
PROGRESS NOTE    [name]  [id number] DOB: December 21, 1965 DOA: 12/07/2022 PCP: Marliss Coots, NP    Brief Narrative:  57 year old male with past medical history of chronic combined CHF, CAD, hyperlipidemia, polyneuropathy, bipolar disorder, COPD, homelessness presented with lower extremity chronic venous stasis wound with concern for superimposed infection. He was seen by orthopedic surgery and was recommended for ongoing left BKA. 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 Currently awaiting skilled nursing facility placement.     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 Sharol Given -Started on doxycycline for 2 weeks; stop date 01/09/23 -PT consulted, recommended skilled nursing facility; [facility] consulted but having issues with placement -Pain control with Neurontin,  20 mg of oxycodone every 4 hours as needed -Ortho to follow-up after discharge to remove the staples; recommend to wear compression sock in the left stump all the time   Hypertension -Continue as needed hydralazine -Continue Imdur, amlodipine, Lasix   Chronic combined CHF -Appears well compensated -Continue Lasix 40 mg p.o. daily   COPD -Stable   Anxiety -Continue Atarax as needed   GERD -Continue PPI   Constipation -Continue senna, Colace, MiraLAX    DVT prophylaxis: SCD's Start: 12/10/22 1731    Code Status: Full Code  Disposition Plan:  Level of care: Med-Surg Status is: Inpatient Remains inpatient appropriate because: needs placement    Consultants:  ortho   Subjective: BP high this AM  Objective: Vitals:   01/07/23 0945 01/07/23 1512 01/07/23 2043 01/08/23 0751  BP: (!) 120/93 108/70 (!) 154/100 (!) 196/135  Pulse: 87 70 88 94  Resp: 16  17 17   Temp: 97.8 F (36.6 C) 97.8 F (36.6 C) 99.3 F (37.4 C) 98.4 F (36.9 C)  TempSrc: Oral Oral  Oral Oral  SpO2: 98% 96% 93% 97%  Weight:      Height:       No intake or output data in the 24 hours ending 01/08/23 1011  Filed Weights   12/28/22 0500 12/29/22 0438 01/05/23 0539  Weight: 81.3 kg 81.3 kg 82 kg    Examination:    General: Appearance:     Overweight male in no acute distress     Lungs:     respirations unlabored  Heart:    Normal heart rate. Normal rhythm. No murmurs, rubs, or gallops.   MS:  B/l amputations  Neurologic:   Awake, alert, oriented x 3. No apparent focal neurological           defect.        Data Reviewed: I have personally reviewed following labs and imaging studies  CBC: Recent Labs  Lab 01/02/23 1034  WBC 5.4  HGB 13.5  HCT 41.8  MCV 90.9  PLT 474   Basic Metabolic Panel: Recent Labs  Lab 01/02/23 1034 01/05/23 0240  NA 139 135  K 3.8 4.3  CL 104 103  CO2 25 23  GLUCOSE 151* 103*  BUN 16 18  CREATININE 0.90 0.85  CALCIUM 9.3 9.2   GFR: Estimated Creatinine Clearance: 99.3 mL/min (by C-G formula based on SCr of 0.85 mg/dL). Liver Function Tests: No results for input(s): "AST", "ALT", "ALKPHOS", "BILITOT", "PROT", "ALBUMIN" in the last 168 hours. No results for input(s): "LIPASE", "AMYLASE" in the last 168 hours. No results for input(s): "AMMONIA" in the last 168 hours. Coagulation Profile: No results for input(s): "INR", "PROTIME" in the last  168 hours. Cardiac Enzymes: No results for input(s): "CKTOTAL", "CKMB", "CKMBINDEX", "TROPONINI" in the last 168 hours. BNP (last 3 results) No results for input(s): "PROBNP" in the last 8760 hours. HbA1C: No results for input(s): "HGBA1C" in the last 72 hours. CBG: Recent Labs  Lab 01/07/23 0747 01/07/23 0946 01/07/23 1105 01/07/23 1644 01/08/23 0746  GLUCAP 84 133* 92 151* 105*   Lipid Profile: No results for input(s): "CHOL", "HDL", "LDLCALC", "TRIG", "CHOLHDL", "LDLDIRECT" in the last 72 hours. Thyroid Function Tests: No results for input(s): "TSH", "T4TOTAL",  "FREET4", "T3FREE", "THYROIDAB" in the last 72 hours. Anemia Panel: No results for input(s): "VITAMINB12", "FOLATE", "FERRITIN", "TIBC", "IRON", "RETICCTPCT" in the last 72 hours. Sepsis Labs: No results for input(s): "PROCALCITON", "LATICACIDVEN" in the last 168 hours.  No results found for this or any previous visit (from the past 240 hour(s)).       Radiology Studies: No results found.      Scheduled Meds:  amLODipine  10 mg Oral Daily   vitamin C  1,000 mg Oral Daily   collagenase   Topical Daily   docusate sodium  100 mg Oral Daily   doxycycline  100 mg Oral H84O   folic acid  1 mg Oral Daily   furosemide  40 mg Oral Daily   gabapentin  900 mg Oral TID   isosorbide mononitrate  15 mg Oral Daily   melatonin  3 mg Oral QHS   multivitamin with minerals  1 tablet Oral Daily   pantoprazole  40 mg Oral Daily   senna-docusate  1 tablet Oral BID   thiamine  100 mg Oral Daily   Continuous Infusions:  magnesium sulfate bolus IVPB       LOS: 32 days    Time spent: 45 minutes spent on chart review, discussion with nursing staff, consultants, updating family and interview/physical exam; more than 50% of that time was spent in counseling and/or coordination of care.    Geradine Girt, DO [facility] Available via Epic secure chat 7am-7pm After these hours, please refer to coverage provider listed on [email or web address] 01/08/2023, 10:11 AM

## 2023-01-09 DIAGNOSIS — I96 Gangrene, not elsewhere classified: Secondary | ICD-10-CM | POA: Diagnosis not present

## 2023-01-09 LAB — GLUCOSE, CAPILLARY
Glucose-Capillary: 116 mg/dL — ABNORMAL HIGH (ref 70–99)
Glucose-Capillary: 119 mg/dL — ABNORMAL HIGH (ref 70–99)
Glucose-Capillary: 117 mg/dL — ABNORMAL HIGH (ref 70–99)

## 2023-01-09 NOTE — TOC Progression Note (Addendum)
Transition of Care [facility]) - Progression Note    Patient Details  Name: [name] MRN: [id number] Date of Birth: [birth year]  Transition [facility]) CM/SW Contact  Joanne Chars, [name] Phone Number: 01/09/2023, 1:53 PM  Clinical Narrative:    [facility] staffed with supervisor Barbette Or, who requests CSW confirm pt would allow facility to become his payee and then contact [facility] regarding possible placement.  CSW spoke with pt who does confirm he would allow SNF facility to be his payee, reports he stayed at [facility] for 7 months under a similar arrangement previously.    CSW LM with [facility] to discuss.    1400; CSW spoke with [facility] and she will review.  1515: [facility] cannot make bed offer  Expected Discharge Plan: Skilled Nursing Facility Barriers to Discharge: Continued Medical Work up, SNF Pending bed offer  Expected Discharge Plan and Services   Discharge Planning Services: CM Consult Post Acute Care Choice: [place] Living arrangements for the past 2 months: Homeless                                       Social Determinants of Health (SDOH) Interventions [place]: Food Insecurity Present (11/24/2022)  Housing: Medium Risk (12/07/2022)  Transportation Needs: Unmet Transportation Needs (12/07/2022)  Utilities: Not At Risk (12/07/2022)  Alcohol Screen: Medium Risk (06/24/2019)  Depression (PHQ2-9): Medium Risk (02/10/2021)  Tobacco Use: High Risk (12/11/2022)    Readmission Risk Interventions    12/09/2022    9:02 AM 12/01/2022    3:56 PM 08/18/2022    9:00 AM  Readmission Risk Prevention Plan  Transportation Screening Complete Complete Complete  Medication Review Press photographer) Complete Complete Complete  PCP or Specialist appointment within 3-5 days of discharge Complete Complete Complete  HRI or Home Care Consult Complete Complete Complete  SW Recovery Care/Counseling Consult Complete  Complete Complete  Palliative Care Screening Not Applicable Not Applicable Not [place] Not Applicable Not Applicable Not Applicable  SNF Comments  n/a

## 2023-01-09 NOTE — Progress Notes (Signed)
Mobility Specialist Progress Note    01/09/23 1437  Mobility  Activity  (wheelchair mobility)  Level of Assistance Modified independent, requires aide device or extra time  Assistive Device Wheelchair  Distance Ambulated (ft) 500 ft (propelled)  RLE Weight Bearing NWB  LLE Weight Bearing NWB  Activity Response Tolerated well  Mobility Referral Yes  $Mobility charge 1 Mobility   Pt received in bed and agreeable. Able to posteriorly scoot into chair. No complaints. Pt completed lateral scoot back to bed. Left sitting EOB with call bell in reach.   Hildred Alamin Mobility Specialist  Please Psychologist, sport and exercise or Rehab Office at [phone]

## 2023-01-09 NOTE — Progress Notes (Signed)
PROGRESS NOTE    [name]  [id number] DOB: [birth year] DOA: 12/07/2022 PCP: Marliss Coots, NP    Brief Narrative:  57 year old male with past medical history of chronic combined CHF, CAD, hyperlipidemia, polyneuropathy, bipolar disorder, COPD, homelessness presented with lower extremity chronic venous stasis wound with concern for superimposed infection. He was seen by orthopedic surgery and was recommended for ongoing left BKA. 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 Currently awaiting skilled nursing facility placement.     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 Sharol Given -Started on doxycycline for 2 weeks; stop date 01/09/23 -PT consulted, recommended skilled nursing facility[facility] but having issues with placement -Pain control with Neurontin,  20 mg of oxycodone every 4 hours as needed -Ortho to follow-up after discharge to remove the staples; recommend to wear compression sock in the left stump all the time   Hypertension -Continue as needed hydralazine -Continue Imdur, amlodipine, Lasix   Chronic combined CHF -Appears well compensated -Continue Lasix 40 mg p.o. daily   COPD -Stable   Anxiety -Continue Atarax as needed   GERD -Continue PPI   Constipation -Continue senna, Colace, MiraLAX    DVT prophylaxis: SCD's Start: 12/10/22 1731    Code Status: Full Code  Disposition Plan:  Level of care: Med-Surg Status is: Inpatient Remains inpatient appropriate because: needs placement    Consultants:  ortho   Subjective: Irritable this AM  Objective: Vitals:   01/08/23 1258 01/08/23 1932 01/09/23 0541 01/09/23 0846  BP: (!) 144/119 128/82 (!) 150/94 (!) 130/90  Pulse: 92 88 98 91  Resp: 17 18 18 18  $ Temp: 98.1 F (36.7 C) 98.2 F (36.8 C) 98 F (36.7 C) 98.7 F (37.1 C)  TempSrc: Oral Oral  Oral   SpO2: 93% 99% 97% 100%  Weight:      Height:        Intake/Output Summary (Last 24 hours) at 01/09/2023 1021 Last data filed at 01/08/2023 2034 Gross per 24 hour  Intake 720 ml  Output 850 ml  Net -130 ml    Filed Weights   12/28/22 0500 12/29/22 0438 01/05/23 0539  Weight: 81.3 kg 81.3 kg 82 kg    Examination:     General: Appearance:     Overweight male in no acute distress     Lungs:      respirations unlabored  Heart:    Normal heart rate. Normal rhythm. No murmurs, rubs, or gallops.   MS:  B/l amputations  Neurologic:   Awake, alert       Data Reviewed: I have personally reviewed following labs and imaging studies  CBC: Recent Labs  Lab 01/02/23 1034  WBC 5.4  HGB 13.5  HCT 41.8  MCV 90.9  PLT 123XX123   Basic Metabolic Panel: Recent Labs  Lab 01/02/23 1034 01/05/23 0240  NA 139 135  K 3.8 4.3  CL 104 103  CO2 25 23  GLUCOSE 151* 103*  BUN 16 18  CREATININE 0.90 0.85  CALCIUM 9.3 9.2   GFR: Estimated Creatinine Clearance: 99.3 mL/min (by C-G formula based on SCr of 0.85 mg/dL). Liver Function Tests: No results for input(s): "AST", "ALT", "ALKPHOS", "BILITOT", "PROT", "ALBUMIN" in the last 168 hours. No results for input(s): "LIPASE", "AMYLASE" in the last 168 hours. No results for input(s): "AMMONIA" in the last 168 hours. Coagulation Profile: No results for input(s): "INR", "PROTIME"  in the last 168 hours. Cardiac Enzymes: No results for input(s): "CKTOTAL", "CKMB", "CKMBINDEX", "TROPONINI" in the last 168 hours. BNP (last 3 results) No results for input(s): "PROBNP" in the last 8760 hours. HbA1C: No results for input(s): "HGBA1C" in the last 72 hours. CBG: Recent Labs  Lab 01/07/23 1644 01/08/23 0746 01/08/23 1137 01/08/23 1616 01/09/23 0731  GLUCAP 151* 105* 103* 105* 116*   Lipid Profile: No results for input(s): "CHOL", "HDL", "LDLCALC", "TRIG", "CHOLHDL", "LDLDIRECT" in the last 72 hours. Thyroid Function Tests: No  results for input(s): "TSH", "T4TOTAL", "FREET4", "T3FREE", "THYROIDAB" in the last 72 hours. Anemia Panel: No results for input(s): "VITAMINB12", "FOLATE", "FERRITIN", "TIBC", "IRON", "RETICCTPCT" in the last 72 hours. Sepsis Labs: No results for input(s): "PROCALCITON", "LATICACIDVEN" in the last 168 hours.  No results found for this or any previous visit (from the past 240 hour(s)).       Radiology Studies: No results found.      Scheduled Meds:  amLODipine  10 mg Oral Daily   vitamin C  1,000 mg Oral Daily   collagenase   Topical Daily   docusate sodium  100 mg Oral Daily   folic acid  1 mg Oral Daily   furosemide  40 mg Oral Daily   gabapentin  900 mg Oral TID   isosorbide mononitrate  15 mg Oral Daily   melatonin  3 mg Oral QHS   multivitamin with minerals  1 tablet Oral Daily   pantoprazole  40 mg Oral Daily   senna-docusate  1 tablet Oral BID   thiamine  100 mg Oral Daily   Continuous Infusions:  magnesium sulfate bolus IVPB       LOS: 33 days    Time spent: 45 minutes spent on chart review, discussion with nursing staff, consultants, updating family and interview/physical exam; more than 50% of that time was spent in counseling and/or coordination of care.    Geradine Girt, DO [facility] Available via Epic secure chat 7am-7pm After these hours, please refer to coverage provider listed on [email or web address] 01/09/2023, 10:21 AM

## 2023-01-10 DIAGNOSIS — I96 Gangrene, not elsewhere classified: Secondary | ICD-10-CM | POA: Diagnosis not present

## 2023-01-10 LAB — GLUCOSE, CAPILLARY: Glucose-Capillary: 100 mg/dL — ABNORMAL HIGH (ref 70–99)

## 2023-01-10 NOTE — Progress Notes (Signed)
PROGRESS NOTE    [name]  [id number] DOB: 1966-04-16 DOA: 12/07/2022 PCP: Marliss Coots, NP    Brief Narrative:  57 year old male with past medical history of chronic combined CHF, CAD, hyperlipidemia, polyneuropathy, bipolar disorder, COPD, homelessness presented with lower extremity chronic venous stasis wound with concern for superimposed infection. He was seen by orthopedic surgery and was recommended for ongoing left BKA. 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 Currently awaiting skilled nursing facility placement.     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 Sharol Given -Started on doxycycline for 2 weeks; stop date 01/09/23 -PT consulted, recommended skilled nursing facility; [facility] consulted but having issues with placement -Pain control with Neurontin,  20 mg of oxycodone every 4 hours as needed -Ortho to follow-up after discharge to remove the staples; recommend to wear compression sock in the left stump all the time   Hypertension -Continue as needed hydralazine -Continue Imdur, amlodipine, Lasix   Chronic combined CHF -Appears well compensated -Continue Lasix 40 mg p.o. daily   COPD -Stable   Anxiety -Continue Atarax as needed   GERD -Continue PPI   Constipation -bowel regimen    DVT prophylaxis: SCD's Start: 12/10/22 1731    Code Status: Full Code  Disposition Plan:  Level of care: Med-Surg Status is: Inpatient Remains inpatient appropriate because: needs placement    Consultants:  ortho   Subjective: Seen in hallway in his wheelchair  Objective: Vitals:   01/09/23 1348 01/09/23 1953 01/10/23 0423 01/10/23 0803  BP: 96/71 131/78 (!) 144/99 (!) 146/96  Pulse: 87 87 77 73  Resp: 17 18 17 16  $ Temp: 97.7 F (36.5 C) 98.2 F (36.8 C) 98.2 F (36.8 C)   TempSrc: Oral     SpO2: 96% 99% 100% 99%   Weight:      Height:        Intake/Output Summary (Last 24 hours) at 01/10/2023 1118 Last data filed at 01/10/2023 [id number] Gross per 24 hour  Intake 720 ml  Output 1000 ml  Net -280 ml    Filed Weights   12/28/22 0500 12/29/22 0438 01/05/23 0539  Weight: 81.3 kg 81.3 kg 82 kg    Examination:     General: Appearance:     Overweight male in no acute distress- in wheelchair      Lungs:      respirations unlabored  Heart:    Normal heart rate.  MS:  B/l amputations  Neurologic:   Awake, alert       Data Reviewed: I have personally reviewed following labs and imaging studies  CBC: No results for input(s): "WBC", "NEUTROABS", "HGB", "HCT", "MCV", "PLT" in the last 168 hours.  Basic Metabolic Panel: Recent Labs  Lab 01/05/23 0240  NA 135  K 4.3  CL 103  CO2 23  GLUCOSE 103*  BUN 18  CREATININE 0.85  CALCIUM 9.2   GFR: Estimated Creatinine Clearance: 99.3 mL/min (by C-G formula based on SCr of 0.85 mg/dL). Liver Function Tests: No results for input(s): "AST", "ALT", "ALKPHOS", "BILITOT", "PROT", "ALBUMIN" in the last 168 hours. No results for input(s): "LIPASE", "AMYLASE" in the last 168 hours. No results for input(s): "AMMONIA" in the last 168 hours. Coagulation Profile: No results for input(s): "INR", "PROTIME" in the last 168 hours. Cardiac Enzymes: No results for input(s): "CKTOTAL", "CKMB", "CKMBINDEX", "TROPONINI" in the last 168 hours. BNP (last 3 results) No  results for input(s): "PROBNP" in the last 8760 hours. HbA1C: No results for input(s): "HGBA1C" in the last 72 hours. CBG: Recent Labs  Lab 01/08/23 1616 01/09/23 0731 01/09/23 1135 01/09/23 1624 01/10/23 0805  GLUCAP 105* 116* 119* 117* 100*   Lipid Profile: No results for input(s): "CHOL", "HDL", "LDLCALC", "TRIG", "CHOLHDL", "LDLDIRECT" in the last 72 hours. Thyroid Function Tests: No results for input(s): "TSH", "T4TOTAL", "FREET4", "T3FREE", "THYROIDAB" in the last 72 hours. Anemia  Panel: No results for input(s): "VITAMINB12", "FOLATE", "FERRITIN", "TIBC", "IRON", "RETICCTPCT" in the last 72 hours. Sepsis Labs: No results for input(s): "PROCALCITON", "LATICACIDVEN" in the last 168 hours.  No results found for this or any previous visit (from the past 240 hour(s)).       Radiology Studies: No results found.      Scheduled Meds:  amLODipine  10 mg Oral Daily   vitamin C  1,000 mg Oral Daily   collagenase   Topical Daily   docusate sodium  100 mg Oral Daily   folic acid  1 mg Oral Daily   furosemide  40 mg Oral Daily   gabapentin  900 mg Oral TID   isosorbide mononitrate  15 mg Oral Daily   melatonin  3 mg Oral QHS   multivitamin with minerals  1 tablet Oral Daily   pantoprazole  40 mg Oral Daily   senna-docusate  1 tablet Oral BID   thiamine  100 mg Oral Daily   Continuous Infusions:  magnesium sulfate bolus IVPB       LOS: 34 days    Time spent: 45 minutes spent on chart review, discussion with nursing staff, consultants, updating family and interview/physical exam; more than 50% of that time was spent in counseling and/or coordination of care.    Geradine Girt, DO [facility] Available via Epic secure chat 7am-7pm After these hours, please refer to coverage provider listed on [email or web address] 01/10/2023, 11:18 AM

## 2023-01-11 DIAGNOSIS — I96 Gangrene, not elsewhere classified: Secondary | ICD-10-CM | POA: Diagnosis not present

## 2023-01-11 NOTE — Plan of Care (Signed)
  Problem: Clinical Measurements: Goal: Diagnostic test results will improve Outcome: Progressing Goal: Signs and symptoms of infection will decrease Outcome: Progressing   Problem: Respiratory: Goal: Ability to maintain adequate ventilation will improve Outcome: Progressing   Problem: Education: Goal: Knowledge of General Education information will improve Description: Including pain rating scale, medication(s)/side effects and non-pharmacologic comfort measures Outcome: Progressing   Problem: Health Behavior/Discharge Planning: Goal: Ability to manage health-related needs will improve Outcome: Progressing   

## 2023-01-11 NOTE — Progress Notes (Signed)
PROGRESS NOTE    [name]  [id number] DOB: 12-17-1965 DOA: 12/07/2022 PCP: Marliss Coots, NP    Brief Narrative:  57 year old male with past medical history of chronic combined CHF, CAD, hyperlipidemia, polyneuropathy, bipolar disorder, COPD, homelessness presented with lower extremity chronic venous stasis wound with concern for superimposed infection. He was seen by orthopedic surgery and was recommended for ongoing left BKA. 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 Currently awaiting skilled nursing facility placement.     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 Sharol Given -Started on doxycycline for 2 weeks; stop date 01/09/23 -PT consulted, recommended skilled nursing facility; [facility] consulted but having issues with placement -Pain control with Neurontin,  20 mg of oxycodone every 4 hours as needed -Ortho to follow-up after discharge to remove the staples; recommend to wear compression sock in the left stump all the time   Hypertension -Continue as needed hydralazine -Continue Imdur, amlodipine, Lasix   Chronic combined CHF -Appears well compensated -Continue Lasix 40 mg p.o. daily   COPD -Stable   Anxiety -Continue Atarax as needed   GERD -Continue PPI   Constipation -bowel regimen    DVT prophylaxis: SCD's Start: 12/10/22 1731    Code Status: Full Code  Disposition Plan:  Level of care: Med-Surg Status is: Inpatient Remains inpatient appropriate because: needs placement    Consultants:  ortho   Subjective: No complaints  Objective: Vitals:   01/10/23 0803 01/10/23 2137 01/11/23 0500 01/11/23 0738  BP: (!) 146/96 125/67  (!) 133/97  Pulse: 73 83  88  Resp: 16 17    Temp:  98.3 F (36.8 C)  98.1 F (36.7 C)  TempSrc:  Oral  Oral  SpO2: 99% 98%  100%  Weight:   82.5 kg   Height:         Intake/Output Summary (Last 24 hours) at 01/11/2023 1057 Last data filed at 01/11/2023 [id number] Gross per 24 hour  Intake 480 ml  Output 875 ml  Net -395 ml    Filed Weights   12/29/22 0438 01/05/23 0539 01/11/23 0500  Weight: 81.3 kg 82 kg 82.5 kg    Examination:     General: Appearance:     Overweight male in no acute distress- in wheelchair      Lungs:      respirations unlabored  Heart:    Normal heart rate.  MS:  B/l amputations  Neurologic:   Awake, alert       Data Reviewed: I have personally reviewed following labs and imaging studies  CBC: No results for input(s): "WBC", "NEUTROABS", "HGB", "HCT", "MCV", "PLT" in the last 168 hours.  Basic Metabolic Panel: Recent Labs  Lab 01/05/23 0240  NA 135  K 4.3  CL 103  CO2 23  GLUCOSE 103*  BUN 18  CREATININE 0.85  CALCIUM 9.2   GFR: Estimated Creatinine Clearance: 99.3 mL/min (by C-G formula based on SCr of 0.85 mg/dL). Liver Function Tests: No results for input(s): "AST", "ALT", "ALKPHOS", "BILITOT", "PROT", "ALBUMIN" in the last 168 hours. No results for input(s): "LIPASE", "AMYLASE" in the last 168 hours. No results for input(s): "AMMONIA" in the last 168 hours. Coagulation Profile: No results for input(s): "INR", "PROTIME" in the last 168 hours. Cardiac Enzymes: No results for input(s): "CKTOTAL", "CKMB", "CKMBINDEX", "TROPONINI" in the last 168 hours. BNP (last 3 results) No results for input(s): "PROBNP" in the  last 8760 hours. HbA1C: No results for input(s): "HGBA1C" in the last 72 hours. CBG: Recent Labs  Lab 01/08/23 1616 01/09/23 0731 01/09/23 1135 01/09/23 1624 01/10/23 0805  GLUCAP 105* 116* 119* 117* 100*   Lipid Profile: No results for input(s): "CHOL", "HDL", "LDLCALC", "TRIG", "CHOLHDL", "LDLDIRECT" in the last 72 hours. Thyroid Function Tests: No results for input(s): "TSH", "T4TOTAL", "FREET4", "T3FREE", "THYROIDAB" in the last 72 hours. Anemia Panel: No results for  input(s): "VITAMINB12", "FOLATE", "FERRITIN", "TIBC", "IRON", "RETICCTPCT" in the last 72 hours. Sepsis Labs: No results for input(s): "PROCALCITON", "LATICACIDVEN" in the last 168 hours.  No results found for this or any previous visit (from the past 240 hour(s)).       Radiology Studies: No results found.      Scheduled Meds:  amLODipine  10 mg Oral Daily   vitamin C  1,000 mg Oral Daily   collagenase   Topical Daily   docusate sodium  100 mg Oral Daily   folic acid  1 mg Oral Daily   furosemide  40 mg Oral Daily   gabapentin  900 mg Oral TID   isosorbide mononitrate  15 mg Oral Daily   melatonin  3 mg Oral QHS   multivitamin with minerals  1 tablet Oral Daily   pantoprazole  40 mg Oral Daily   senna-docusate  1 tablet Oral BID   thiamine  100 mg Oral Daily   Continuous Infusions:  magnesium sulfate bolus IVPB       LOS: 35 days    Time spent: 45 minutes spent on chart review, discussion with nursing staff, consultants, updating family and interview/physical exam; more than 50% of that time was spent in counseling and/or coordination of care.    Geradine Girt, DO [facility] Available via Epic secure chat 7am-7pm After these hours, please refer to coverage provider listed on [email or web address] 01/11/2023, 10:57 AM

## 2023-01-12 DIAGNOSIS — I96 Gangrene, not elsewhere classified: Secondary | ICD-10-CM | POA: Diagnosis not present

## 2023-01-12 NOTE — Plan of Care (Addendum)
Removed 5 staples, patient medicated with IV dilaudid prior to, patient unable to tolerate anymore, will attempt to contiue tomorrow.   Problem: Education: Goal: Knowledge of General Education information will improve Description: Including pain rating scale, medication(s)/side effects and non-pharmacologic comfort measures Outcome: Progressing   Problem: Activity: Goal: Risk for activity intolerance will decrease Outcome: Progressing   Problem: Pain Managment: Goal: General experience of comfort will improve Outcome: Progressing   Problem: Safety: Goal: Ability to remain free from injury will improve Outcome: Progressing   Problem: Skin Integrity: Goal: Risk for impaired skin integrity will decrease Outcome: Progressing

## 2023-01-12 NOTE — Progress Notes (Signed)
PROGRESS NOTE    [name]  [id number] DOB: [birth year] DOA: 12/07/2022 PCP: Marliss Coots, NP    Brief Narrative:  57 year old male with past medical history of chronic combined CHF, CAD, hyperlipidemia, polyneuropathy, bipolar disorder, COPD, homelessness presented with lower extremity chronic venous stasis wound with concern for superimposed infection. He was seen by orthopedic surgery and was recommended for ongoing left BKA. 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 Currently awaiting skilled nursing facility placement.     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 Sharol Given -Started on doxycycline for 2 weeks; stop date 01/09/23 -PT consulted, recommended skilled nursing facility; [facility] consulted but having issues with placement -Pain control with Neurontin,  20 mg of oxycodone every 4 hours as needed -Ortho to follow-up after discharge to remove the staples; recommend to wear compression sock in the left stump all the time   Hypertension -Continue as needed hydralazine -Continue Imdur, amlodipine, Lasix   Chronic combined CHF -Appears well compensated -Continue Lasix 40 mg p.o. daily   COPD -Stable   Anxiety -Continue Atarax as needed   GERD -Continue PPI   Constipation -bowel regimen    DVT prophylaxis: SCD's Start: 12/10/22 1731    Code Status: Full Code  Disposition Plan:  Level of care: Med-Surg Status is: Inpatient Remains inpatient appropriate because: needs placement    Consultants:  ortho   Subjective: No complaints  Objective: Vitals:   01/11/23 0738 01/11/23 1950 01/12/23 0500 01/12/23 0724  BP: (!) 133/97 109/71  (!) 134/96  Pulse: 88 82  89  Resp:      Temp: 98.1 F (36.7 C)   97.9 F (36.6 C)  TempSrc: Oral   Oral  SpO2: 100% 97%  99%  Weight:   82 kg   Height:         Intake/Output Summary (Last 24 hours) at 01/12/2023 1207 Last data filed at 01/12/2023 0800 Gross per 24 hour  Intake --  Output 900 ml  Net -900 ml    Filed Weights   01/05/23 0539 01/11/23 0500 01/12/23 0500  Weight: 82 kg 82.5 kg 82 kg    Examination:     General: Appearance:     Overweight male in no acute distress- in wheelchair      Lungs:      respirations unlabored  Heart:    Normal heart rate.  MS:  B/l amputations  Neurologic:   Awake, alert       Data Reviewed: I have personally reviewed following labs and imaging studies  CBC: No results for input(s): "WBC", "NEUTROABS", "HGB", "HCT", "MCV", "PLT" in the last 168 hours.  Basic Metabolic Panel: No results for input(s): "NA", "K", "CL", "CO2", "GLUCOSE", "BUN", "CREATININE", "CALCIUM", "MG", "PHOS" in the last 168 hours.  GFR: Estimated Creatinine Clearance: 99.3 mL/min (by C-G formula based on SCr of 0.85 mg/dL). Liver Function Tests: No results for input(s): "AST", "ALT", "ALKPHOS", "BILITOT", "PROT", "ALBUMIN" in the last 168 hours. No results for input(s): "LIPASE", "AMYLASE" in the last 168 hours. No results for input(s): "AMMONIA" in the last 168 hours. Coagulation Profile: No results for input(s): "INR", "PROTIME" in the last 168 hours. Cardiac Enzymes: No results for input(s): "CKTOTAL", "CKMB", "CKMBINDEX", "TROPONINI" in the last 168 hours. BNP (last 3 results) No results for input(s): "PROBNP" in the last 8760 hours. HbA1C: No results for input(s): "HGBA1C" in the last 72  hours. CBG: Recent Labs  Lab 01/08/23 1616 01/09/23 0731 01/09/23 1135 01/09/23 1624 01/10/23 0805  GLUCAP 105* 116* 119* 117* 100*   Lipid Profile: No results for input(s): "CHOL", "HDL", "LDLCALC", "TRIG", "CHOLHDL", "LDLDIRECT" in the last 72 hours. Thyroid Function Tests: No results for input(s): "TSH", "T4TOTAL", "FREET4", "T3FREE", "THYROIDAB" in the last 72 hours. Anemia Panel: No results for input(s):  "VITAMINB12", "FOLATE", "FERRITIN", "TIBC", "IRON", "RETICCTPCT" in the last 72 hours. Sepsis Labs: No results for input(s): "PROCALCITON", "LATICACIDVEN" in the last 168 hours.  No results found for this or any previous visit (from the past 240 hour(s)).       Radiology Studies: No results found.      Scheduled Meds:  amLODipine  10 mg Oral Daily   vitamin C  1,000 mg Oral Daily   collagenase   Topical Daily   docusate sodium  100 mg Oral Daily   folic acid  1 mg Oral Daily   furosemide  40 mg Oral Daily   gabapentin  900 mg Oral TID   isosorbide mononitrate  15 mg Oral Daily   melatonin  3 mg Oral QHS   multivitamin with minerals  1 tablet Oral Daily   pantoprazole  40 mg Oral Daily   senna-docusate  1 tablet Oral BID   thiamine  100 mg Oral Daily   Continuous Infusions:  magnesium sulfate bolus IVPB       LOS: 36 days    Time spent: 45 minutes spent on chart review, discussion with nursing staff, consultants, updating family and interview/physical exam; more than 50% of that time was spent in counseling and/or coordination of care.    Geradine Girt, DO [facility] Available via Epic secure chat 7am-7pm After these hours, please refer to coverage provider listed on [email or web address] 01/12/2023, 12:07 PM

## 2023-01-12 NOTE — Progress Notes (Signed)
Physical Therapy Treatment Patient Details Name: [name] MRN: [id number] DOB: Jan 11, 1966 Today's Date: 01/12/2023   History of Present Illness Pt is a 57 y/o M who presented 12/17/22 with L ankle cellulitis and underwent L BKA on 1/10. PMH includes asthma, bioplar 1, depression, schizoaffective disorder, CVA, chronic CHF, homelessness, HTN, DM2, cocaine and cannabis use, and R AKA.    PT Comments    Pt progressing towards physical therapy goals. If pt goes drug rehab at d/c, will need to be more independent with transfers. Marya Amsler, CSW looking into getting him a new w/c that is more stable. Pt having a difficult time understanding that he will not have someone to move the w/c from one side of the bed to the other for him at drug rehab. He will need to be able to perform A<>P transfer to enter and exit the bed so he will not have to have assist to move the wheelchair for him. Anticipate pt will be able to do this when he is agreeable to try. Will continue to follow and progress as able per POC.    Recommendations for follow up therapy are one component of a multi-disciplinary discharge planning process, led by the attending physician.  Recommendations may be updated based on patient status, additional functional criteria and insurance authorization.  Follow Up Recommendations  Skilled nursing-short term rehab (<3 hours/day) Can patient physically be transported by private vehicle: No   Assistance Recommended at Discharge Intermittent Supervision/Assistance  Patient can return home with the following A little help with walking and/or transfers;A little help with bathing/dressing/bathroom;Assistance with cooking/housework;Assist for transportation;Help with stairs or ramp for entrance   Equipment Recommendations  Other (comment) (Recommending L elevating leg rest for wheelchair but pt refusing it)    Recommendations for Other Services       Precautions / Restrictions  Precautions Precautions: Fall Precaution Comments: R AKA, L BKA Restrictions Weight Bearing Restrictions: No RLE Weight Bearing: Non weight bearing LLE Weight Bearing: Non weight bearing     Mobility  Bed Mobility Overal bed mobility: Modified Independent Bed Mobility: Supine to Sit           General bed mobility comments: Pt able to transition to EOB without assistance and scoot out to sit with L residual limb dangling.    Transfers Overall transfer level: Modified independent Equipment used: None Transfers: Bed to chair/wheelchair/BSC         Anterior-Posterior transfers: Modified independent (Device/Increase time) (PT stabilizing w/c)   General transfer comment: Pt was able to easily transition bed to chair utilizing A>P transfer. Pt stabilized w/c but no assist required.    Ambulation/Gait               General Gait Details: Lexicographer mobility: Yes Wheelchair propulsion: Both upper extremities Wheelchair parts: Independent (arm rests; wheelchair does not have leg rests) Distance: 510  Modified Rankin (Stroke Patients Only)       Balance Overall balance assessment: Needs assistance Sitting-balance support: Feet unsupported, Single extremity supported Sitting balance-Leahy Scale: Good Sitting balance - Comments: sitting on EOB                                    Cognition Arousal/Alertness: Awake/alert Behavior During Therapy: WFL for tasks assessed/performed Overall Cognitive Status: No family/caregiver present to determine  baseline cognitive functioning Area of Impairment: Attention, Memory, Following commands, Safety/judgement, Problem solving, Awareness                   Current Attention Level: Sustained Memory: Decreased recall of precautions, Decreased short-term memory Following Commands: Follows multi-step commands  consistently Safety/Judgement: Decreased awareness of deficits, Decreased awareness of safety Awareness: Emergent Problem Solving: Slow processing, Requires verbal cues, Requires tactile cues, Difficulty sequencing General Comments: Pt very aware of current need for assist to steady chair during transfers. Difficulty grasping new concepts and hesitant to try new ways of transferring.        Exercises  LAQ x10 LLE    General Comments        Pertinent Vitals/Pain Pain Assessment Pain Assessment: Faces Faces Pain Scale: Hurts a little bit Pain Location: left residual limb Pain Descriptors / Indicators: Operative site guarding, Sore Pain Intervention(s): Limited activity within patient's tolerance, Monitored during session, Repositioned    Home Living                          Prior Function            PT Goals (current goals can now be found in the care plan section) Acute Rehab PT Goals Patient Stated Goal: Be able to transfer himself from w/c back to bed. PT Goal Formulation: With patient Time For Goal Achievement: 01/12/23 Potential to Achieve Goals: Good Progress towards PT goals: Progressing toward goals    Frequency    Min 1X/week      PT Plan Current plan remains appropriate    Co-evaluation              AM-PAC PT "6 Clicks" Mobility   Outcome Measure  Help needed turning from your back to your side while in a flat bed without using bedrails?: None Help needed moving from lying on your back to sitting on the side of a flat bed without using bedrails?: None Help needed moving to and from a bed to a chair (including a wheelchair)?: A Little Help needed standing up from a chair using your arms (e.g., wheelchair or bedside chair)?: Total Help needed to walk in hospital room?: Total Help needed climbing 3-5 steps with a railing? : Total 6 Click Score: 14    End of Session Equipment Utilized During Treatment: Gait belt Activity Tolerance:  Patient tolerated treatment well Patient left: Other (comment);with nursing/sitter in room (In wheelchair mobilizing around room) Nurse Communication: Mobility status PT Visit Diagnosis: Muscle weakness (generalized) (M62.81);Difficulty in walking, not elsewhere classified (R26.2);Other abnormalities of gait and mobility (R26.89)     Time: [id number] PT Time Calculation (min) (ACUTE ONLY): 37 min  Charges:  $Self Care/Home Management: 8-22 $Wheel Chair Management: 8-22 mins                     Rolinda Roan, PT, DPT Acute Rehabilitation Services Secure Chat Preferred Office: [phone]    [name] 01/12/2023, 3:29 PM

## 2023-01-13 DIAGNOSIS — I96 Gangrene, not elsewhere classified: Secondary | ICD-10-CM | POA: Diagnosis not present

## 2023-01-13 NOTE — TOC Progression Note (Signed)
Transition of Care [facility]) - Progression Note    Patient Details  Name: [name] MRN: [id number] Date of Birth: 11-26-1966  Transition of Care [facility]) CM/SW Contact  Joanne Chars, [name] Phone Number: 01/13/2023, 10:14 AM  Clinical Narrative:   CSW spoke with pt regarding no SNF bed offers, discussed pt progress with PT and potential of pursuing residential substance abuse treatment at this time.  Pt is interested in this.  CSW spoke with Laura/PT.  Pt has made good progress with mobility.  If pt could get a more functional wheelchair, would help with his ability to transfer, which is the last area they are working on.  Pt mostly independent.    Expected Discharge Plan: [place] Barriers to Discharge: Continued Medical Work up, SNF Pending bed offer  Expected Discharge Plan and Services   Discharge Planning Services: CM Consult Post Acute Care Choice: [place] Living arrangements for the past 2 months: Homeless                                       Social Determinants of Health (SDOH) Interventions [place]: Food Insecurity Present (11/24/2022)  Housing: Medium Risk (12/07/2022)  Transportation Needs: Unmet Transportation Needs (12/07/2022)  Utilities: Not At Risk (12/07/2022)  Alcohol Screen: Medium Risk (06/24/2019)  Depression (PHQ2-9): Medium Risk (02/10/2021)  Tobacco Use: High Risk (12/11/2022)    Readmission Risk Interventions    12/09/2022    9:02 AM 12/01/2022    3:56 PM 08/18/2022    9:00 AM  Readmission Risk Prevention Plan  Transportation Screening Complete Complete Complete  Medication Review Press photographer) Complete Complete Complete  PCP or Specialist appointment within 3-5 days of discharge Complete Complete Complete  HRI or Home Care Consult Complete Complete Complete  SW Recovery Care/Counseling Consult Complete Complete Complete  Palliative Care Screening Not Applicable Not Applicable  Not [place] Not Applicable Not Applicable Not Applicable  SNF Comments  n/a

## 2023-01-13 NOTE — Progress Notes (Signed)
Mobility Specialist Progress Note    01/13/23 1432  Mobility  Activity Transferred from chair to bed  Level of Assistance Contact guard assist, steadying assist  Assistive Device Wheelchair  RLE Weight Bearing NWB  LLE Weight Bearing NWB  Activity Response Tolerated well  Mobility Referral Yes  $Mobility charge 1 Mobility   Pt received in WC c/o pain and not feeling good. Pt declined going outside and practicing anterior scoot to bed. Pt requested assist steadying the WC as pt turned in chair and posteriorly scooted over the side of the WC onto the bed. Pt left in bed with call bell in reach. RN notified.   Hildred Alamin Mobility Specialist  Please Psychologist, sport and exercise or Rehab Office at [phone]

## 2023-01-13 NOTE — Plan of Care (Addendum)
Removed remaining staples, patient tolerated it well.   Problem: Education: Goal: Knowledge of the prescribed therapeutic regimen will improve Outcome: Progressing   Problem: Activity: Goal: Ability to perform//tolerate increased activity and mobilize with assistive devices will improve Outcome: Progressing   Problem: Pain Management: Goal: Pain level will decrease with appropriate interventions Outcome: Progressing   Problem: Skin Integrity: Goal: Demonstration of wound healing without infection will improve Outcome: Progressing

## 2023-01-13 NOTE — Progress Notes (Addendum)
PROGRESS NOTE    [name]  [id number] DOB: 05/11/1966 DOA: 12/07/2022 PCP: Marliss Coots, NP    Brief Narrative:  57 year old male with past medical history of chronic combined CHF, CAD, hyperlipidemia, polyneuropathy, bipolar disorder, COPD, homelessness presented with lower extremity chronic venous stasis wound with concern for superimposed infection. He was seen by orthopedic surgery and was recommended for ongoing left BKA. 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 Currently awaiting skilled nursing facility placement.     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 Sharol Given.  He has completed 2 weeks of doxycycline with a stop date 2/9 -PT consulted, recommended skilled nursing facility; [facility] consulted but having issues with placement -Pain control with Neurontin, 20 mg of oxycodone every 4 hours as needed   Active problems Hypertension -Continue Imdur, amlodipine, Lasix   Chronic combined CHF -Appears well compensated, continue Lasix 40 mg p.o. daily.  Has been refusing labs   COPD -Stable   Anxiety -Continue Atarax as needed   GERD -Continue PPI   Constipation -bowel regimen   Scheduled Meds:  amLODipine  10 mg Oral Daily   vitamin C  1,000 mg Oral Daily   collagenase   Topical Daily   docusate sodium  100 mg Oral Daily   folic acid  1 mg Oral Daily   furosemide  40 mg Oral Daily   gabapentin  900 mg Oral TID   isosorbide mononitrate  15 mg Oral Daily   melatonin  3 mg Oral QHS   multivitamin with minerals  1 tablet Oral Daily   pantoprazole  40 mg Oral Daily   senna-docusate  1 tablet Oral BID   thiamine  100 mg Oral Daily   Continuous Infusions:  magnesium sulfate bolus IVPB     PRN Meds:.albuterol, alum & mag hydroxide-simeth, bisacodyl,  guaiFENesin-dextromethorphan, hydrALAZINE, HYDROmorphone (DILAUDID) injection, hydrOXYzine, labetalol, magnesium citrate, magnesium sulfate bolus IVPB, ondansetron, oxyCODONE, phenol, polyethylene glycol, traMADol  DVT prophylaxis: SCD's Start: 12/10/22 1731    Code Status: Full Code  Disposition Plan:  Level of care: Med-Surg Status is: Inpatient Remains inpatient appropriate because: needs placement    Consultants:  ortho   Objective: Vitals:   01/12/23 2234 01/13/23 0554 01/13/23 0813 01/13/23 0814  BP: (!) 138/96 109/86 (!) 132/97 (!) 132/97  Pulse: 78 81 75   Resp:  18    Temp: 97.9 F (36.6 C) 98.2 F (36.8 C) 98 F (36.7 C)   TempSrc: Oral Oral Oral   SpO2: 100% 100% 98%   Weight:      Height:        Intake/Output Summary (Last 24 hours) at 01/13/2023 0829 Last data filed at 01/12/2023 1400 Gross per 24 hour  Intake 240 ml  Output 300 ml  Net -60 ml    Filed Weights   01/05/23 0539 01/11/23 0500 01/12/23 0500  Weight: 82 kg 82.5 kg 82 kg    Examination:  Constitutional: NAD Respiratory: clear to auscultation bilaterally, no wheezing, no crackles. Cardiovascular: Regular rate and rhythm, no murmurs / rubs / gallops.     Data Reviewed: I have personally reviewed following labs and imaging studies  CBC: No results for input(s): "WBC", "NEUTROABS", "HGB", "HCT", "MCV", "PLT" in the last 168 hours.  Basic Metabolic Panel: No results for input(s): "NA", "K", "CL", "CO2", "GLUCOSE", "BUN", "CREATININE", "CALCIUM", "MG", "PHOS"  in the last 168 hours.  GFR: Estimated Creatinine Clearance: 99.3 mL/min (by C-G formula based on SCr of 0.85 mg/dL). Liver Function Tests: No results for input(s): "AST", "ALT", "ALKPHOS", "BILITOT", "PROT", "ALBUMIN" in the last 168 hours. No results for input(s): "LIPASE", "AMYLASE" in the last 168 hours. No results for input(s): "AMMONIA" in the last 168 hours. Coagulation Profile: No results for input(s): "INR", "PROTIME"  in the last 168 hours. Cardiac Enzymes: No results for input(s): "CKTOTAL", "CKMB", "CKMBINDEX", "TROPONINI" in the last 168 hours. BNP (last 3 results) No results for input(s): "PROBNP" in the last 8760 hours. HbA1C: No results for input(s): "HGBA1C" in the last 72 hours. CBG: Recent Labs  Lab 01/08/23 1616 01/09/23 0731 01/09/23 1135 01/09/23 1624 01/10/23 0805  GLUCAP 105* 116* 119* 117* 100*   Lipid Profile: No results for input(s): "CHOL", "HDL", "LDLCALC", "TRIG", "CHOLHDL", "LDLDIRECT" in the last 72 hours. Thyroid Function Tests: No results for input(s): "TSH", "T4TOTAL", "FREET4", "T3FREE", "THYROIDAB" in the last 72 hours. Anemia Panel: No results for input(s): "VITAMINB12", "FOLATE", "FERRITIN", "TIBC", "IRON", "RETICCTPCT" in the last 72 hours. Sepsis Labs: No results for input(s): "PROCALCITON", "LATICACIDVEN" in the last 168 hours.  No results found for this or any previous visit (from the past 240 hour(s)).   Radiology Studies: No results found.  Scheduled Meds:  amLODipine  10 mg Oral Daily   vitamin C  1,000 mg Oral Daily   collagenase   Topical Daily   docusate sodium  100 mg Oral Daily   folic acid  1 mg Oral Daily   furosemide  40 mg Oral Daily   gabapentin  900 mg Oral TID   isosorbide mononitrate  15 mg Oral Daily   melatonin  3 mg Oral QHS   multivitamin with minerals  1 tablet Oral Daily   pantoprazole  40 mg Oral Daily   senna-docusate  1 tablet Oral BID   thiamine  100 mg Oral Daily   Continuous Infusions:  magnesium sulfate bolus IVPB       LOS: 37 days  Costin M. Cruzita Lederer, MD, PhD [facility]  Between 7 am - 7 pm you can contact me via Amion (for emergencies) or [place] (non urgent matters).  I am not available 7 pm - 7 am, please contact night coverage MD/APP via Amion

## 2023-01-13 NOTE — TOC Progression Note (Signed)
Transition of Care [facility]) - Progression Note    Patient Details  Name: [name] MRN: [id number] Date of Birth: 1965-12-06  Transition of Care [facility]) CM/SW Contact  Joanne Chars, [name] Phone Number: 01/13/2023, 10:13 AM  Clinical Narrative:   CSW spoke with [employer] regarding pt eligibility for wheelchair.  They researched and found that pt received wheelchairs on 2/22 and again on 6/23.  Pt not eligible for another one through medicaid.  CSW spoke with [facility] residential treatment regarding possible admission.  They requested pt referral be faxed to [phone], which was done.     Expected Discharge Plan: [place] Barriers to Discharge: Continued Medical Work up, SNF Pending bed offer  Expected Discharge Plan and Services   Discharge Planning Services: CM Consult Post Acute Care Choice: [place] Living arrangements for the past 2 months: Homeless                                       Social Determinants of Health (SDOH) Interventions [place]: Food Insecurity Present (11/24/2022)  Housing: Medium Risk (12/07/2022)  Transportation Needs: Unmet Transportation Needs (12/07/2022)  Utilities: Not At Risk (12/07/2022)  Alcohol Screen: Medium Risk (06/24/2019)  Depression (PHQ2-9): Medium Risk (02/10/2021)  Tobacco Use: High Risk (12/11/2022)    Readmission Risk Interventions    12/09/2022    9:02 AM 12/01/2022    3:56 PM 08/18/2022    9:00 AM  Readmission Risk Prevention Plan  Transportation Screening Complete Complete Complete  Medication Review Press photographer) Complete Complete Complete  PCP or Specialist appointment within 3-5 days of discharge Complete Complete Complete  HRI or Home Care Consult Complete Complete Complete  SW Recovery Care/Counseling Consult Complete Complete Complete  Palliative Care Screening Not Applicable Not Applicable Not [place] Not Applicable Not  Applicable Not Applicable  SNF Comments  n/a

## 2023-01-13 NOTE — Plan of Care (Signed)
  Problem: Education: Goal: Knowledge of General Education information will improve Description: Including pain rating scale, medication(s)/side effects and non-pharmacologic comfort measures Outcome: Progressing   Problem: Health Behavior/Discharge Planning: Goal: Ability to manage health-related needs will improve Outcome: Progressing   Problem: Activity: Goal: Risk for activity intolerance will decrease Outcome: Progressing   

## 2023-01-14 DIAGNOSIS — I96 Gangrene, not elsewhere classified: Secondary | ICD-10-CM | POA: Diagnosis not present

## 2023-01-14 LAB — BASIC METABOLIC PANEL
Anion gap: 9 (ref 5–15)
BUN: 17 mg/dL (ref 6–20)
CO2: 25 mmol/L (ref 22–32)
Calcium: 8.9 mg/dL (ref 8.9–10.3)
Chloride: 105 mmol/L (ref 98–111)
Creatinine, Ser: 0.98 mg/dL (ref 0.61–1.24)
GFR, Estimated: >60 mL/min (ref >60)
Glucose, Bld: 122 mg/dL — ABNORMAL HIGH (ref 70–99)
Potassium: 4 mmol/L (ref 3.5–5.1)
Sodium: 139 mmol/L (ref 135–145)

## 2023-01-14 LAB — CBC
HCT: 38.7 % — ABNORMAL LOW (ref 39.0–52.0)
Hemoglobin: 12.8 g/dL — ABNORMAL LOW (ref 13.0–17.0)
MCH: 30 pg (ref 26.0–34.0)
MCHC: 33.1 g/dL (ref 30.0–36.0)
MCV: 90.8 fL (ref 80.0–100.0)
Platelets: 254 10*3/uL (ref 150–400)
RBC: 4.26 MIL/uL (ref 4.22–5.81)
RDW: 14 % (ref 11.5–15.5)
WBC: 6 10*3/uL (ref 4.0–10.5)
nRBC: 0 % (ref 0.0–0.2)

## 2023-01-14 NOTE — TOC Progression Note (Addendum)
Transition of Care [facility]) - Progression Note    Patient Details  Name: [name] MRN: [id number] Date of Birth: 05/10/1966  Transition of Care [facility]) CM/SW Contact  Joanne Chars, [name] Phone Number: 01/14/2023, 11:01 AM  Clinical Narrative:  CSW LM with [place]/intake at [facility] recovery.   1615: CSW spoke with [facility].  Discussed pt in wheelchair, nearing independence.  She will discuss with her nursing staff.    Expected Discharge Plan: [place] Barriers to Discharge: Continued Medical Work up, SNF Pending bed offer  Expected Discharge Plan and Services   Discharge Planning Services: CM Consult Post Acute Care Choice: [place] Living arrangements for the past 2 months: Homeless                                       Social Determinants of Health (SDOH) Interventions [place]: Food Insecurity Present (11/24/2022)  Housing: Medium Risk (12/07/2022)  Transportation Needs: Unmet Transportation Needs (12/07/2022)  Utilities: Not At Risk (12/07/2022)  Alcohol Screen: Medium Risk (06/24/2019)  Depression (PHQ2-9): Medium Risk (02/10/2021)  Tobacco Use: High Risk (12/11/2022)    Readmission Risk Interventions    12/09/2022    9:02 AM 12/01/2022    3:56 PM 08/18/2022    9:00 AM  Readmission Risk Prevention Plan  Transportation Screening Complete Complete Complete  Medication Review Press photographer) Complete Complete Complete  PCP or Specialist appointment within 3-5 days of discharge Complete Complete Complete  HRI or Home Care Consult Complete Complete Complete  SW Recovery Care/Counseling Consult Complete Complete Complete  Palliative Care Screening Not Applicable Not Applicable Not [place] Not Applicable Not Applicable Not Applicable  SNF Comments  n/a

## 2023-01-14 NOTE — Progress Notes (Signed)
Occupational Therapy Treatment Patient Details Name: [name] MRN: [id number] DOB: 1966-10-30 Today's Date: 01/14/2023   History of present illness Pt is a 57 y/o M who presented 12/17/22 with L ankle cellulitis and underwent L BKA on 1/10. PMH includes asthma, bioplar 1, depression, schizoaffective disorder, CVA, chronic CHF, homelessness, HTN, DM2, cocaine and cannabis use, and R AKA.   OT comments  Pt in room upon therapy arrival. Pt education provided during session regarding transfer techniques and requirements to be able to possibly go to a drug rehab center. Pt declined completing AP transfer and prefers to complete lateral scoot. Discussed working on LB dressing as pt will not be able to wear a hospital gown if he discharges to a drug rehab center. Pt not interested at this time. Session ended early due to visitor and nursing care needed.    Recommendations for follow up therapy are one component of a multi-disciplinary discharge planning process, led by the attending physician.  Recommendations may be updated based on patient status, additional functional criteria and insurance authorization.    Follow Up Recommendations  Skilled nursing-short term rehab (<3 hours/day)     Assistance Recommended at Discharge PRN  Patient can return home with the following  A little help with walking and/or transfers;A little help with bathing/dressing/bathroom;Help with stairs or ramp for entrance;Assist for transportation   Equipment Recommendations  Other (comment) (defer to next venue)       Precautions / Restrictions Precautions Precautions: Fall Precaution Comments: R AKA, L BKA Restrictions Weight Bearing Restrictions: No RLE Weight Bearing: Non weight bearing LLE Weight Bearing: Non weight bearing              ADL either performed or assessed with clinical judgement      Cognition Arousal/Alertness: Awake/alert Behavior During Therapy: WFL for tasks  assessed/performed Overall Cognitive Status: No family/caregiver present to determine baseline cognitive functioning Area of Impairment: Attention, Memory, Safety/judgement     Safety/Judgement: Decreased awareness of safety, Decreased awareness of deficits   Problem Solving: Slow processing General Comments: Pt was able to relay back previous education provided to him on requirements functionally for drug rehab.        Exercises Other Exercises Other Exercises: Pt education provided on difference between AP transfer and lateral scoot transfer and why AP transfer would be preferred method if needed to transfer without assistance. Pt did not seem to understand fully rationale and will need additional time and education.            Pertinent Vitals/ Pain       Pain Assessment Pain Assessment: Faces Faces Pain Scale: No hurt         Frequency  Min 1X/week        Progress Toward Goals  OT Goals(current goals can now be found in the care plan section)  Progress towards OT goals: Progressing toward goals     Plan Discharge plan remains appropriate;Frequency remains appropriate       AM-PAC OT "6 Clicks" Daily Activity     Outcome Measure   Help from another person eating meals?: None Help from another person taking care of personal grooming?: None Help from another person toileting, which includes using toliet, bedpan, or urinal?: None Help from another person bathing (including washing, rinsing, drying)?: A Little Help from another person to put on and taking off regular upper body clothing?: None Help from another person to put on and taking off regular lower body clothing?: A Little 6 Click  Score: 22    End of Session Equipment Utilized During Treatment: Other (comment) (wheelchair)  OT Visit Diagnosis: Muscle weakness (generalized) (M62.81);Pain Pain - Right/Left: Left Pain - part of body: Leg   Activity Tolerance Patient tolerated treatment well   Patient  Left Other (comment);with nursing/sitter in room;with family/visitor present (wheelchair)           Time: [id number] OT Time Calculation (min): 20 min  Charges: OT General Charges $OT Visit: 1 Visit OT Treatments $Therapeutic Activity: 8-22 mins  Ailene Ravel, OTR/L,CBIS  Supplemental OT - [facility] and [facility] Secure Chat Preferred    Dyanara Cozza, [name] 01/14/2023, 3:58 PM

## 2023-01-14 NOTE — Progress Notes (Signed)
Patient refused vitals, new IV line placement and lab draw.

## 2023-01-14 NOTE — Progress Notes (Signed)
PROGRESS NOTE    [name]  [id number] DOB: [birth year] DOA: 12/07/2022 PCP: Marliss Coots, NP    Brief Narrative:  57 year old male with past medical history of chronic combined CHF, CAD, hyperlipidemia, polyneuropathy, bipolar disorder, COPD, homelessness presented with lower extremity chronic venous stasis wound with concern for superimposed infection. He was seen by orthopedic surgery and was recommended for ongoing left BKA. 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 Currently awaiting skilled nursing facility placement.     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 Sharol Given.  He has completed 2 weeks of doxycycline with a stop date 2/9 -PT consulted, recommended skilled nursing facility; [facility] consulted but having issues with placement -Pain control with Neurontin, 20 mg of oxycodone every 4 hours as needed   Active problems Hypertension -Continue Imdur, amlodipine, Lasix   Chronic combined CHF -Appears well compensated, continue Lasix 40 mg p.o. daily.  Has been refusing labs   COPD -Stable   Anxiety -Continue Atarax as needed   GERD -Continue PPI   Constipation -bowel regimen   Scheduled Meds:  amLODipine  10 mg Oral Daily   vitamin C  1,000 mg Oral Daily   collagenase   Topical Daily   docusate sodium  100 mg Oral Daily   folic acid  1 mg Oral Daily   furosemide  40 mg Oral Daily   gabapentin  900 mg Oral TID   isosorbide mononitrate  15 mg Oral Daily   melatonin  3 mg Oral QHS   multivitamin with minerals  1 tablet Oral Daily   pantoprazole  40 mg Oral Daily   senna-docusate  1 tablet Oral BID   thiamine  100 mg Oral Daily   Continuous Infusions:  magnesium sulfate bolus IVPB     PRN Meds:.albuterol, alum & mag hydroxide-simeth, bisacodyl,  guaiFENesin-dextromethorphan, hydrALAZINE, HYDROmorphone (DILAUDID) injection, hydrOXYzine, labetalol, magnesium citrate, magnesium sulfate bolus IVPB, ondansetron, oxyCODONE, phenol, polyethylene glycol, traMADol  DVT prophylaxis: SCD's Start: 12/10/22 1731    Code Status: Full Code  Disposition Plan:  Level of care: Med-Surg Status is: Inpatient Remains inpatient appropriate because: needs placement    Consultants:  ortho   Objective: Vitals:   01/13/23 0814 01/13/23 1507 01/14/23 0634 01/14/23 0716  BP: (!) 132/97 118/73 (!) 153/101 134/88  Pulse:  70 79 87  Resp:   18 17  Temp:  97.7 F (36.5 C) 97.7 F (36.5 C) 98 F (36.7 C)  TempSrc:  Oral Oral Oral  SpO2:  100% 97% 97%  Weight:      Height:        Intake/Output Summary (Last 24 hours) at 01/14/2023 1033 Last data filed at 01/14/2023 0900 Gross per 24 hour  Intake 240 ml  Output --  Net 240 ml     Filed Weights   01/05/23 0539 01/11/23 0500 01/12/23 0500  Weight: 82 kg 82.5 kg 82 kg    Examination:  Constitutional: NAD Respiratory: clear to auscultation bilaterally, no wheezing, no crackles. Cardiovascular: Regular rate and rhythm, no murmurs / rubs / gallops.     Data Reviewed: I have personally reviewed following labs and imaging studies  CBC: No results for input(s): "WBC", "NEUTROABS", "HGB", "HCT", "MCV", "PLT" in the last 168 hours.  Basic Metabolic Panel: No results for input(s): "NA", "K", "CL", "CO2", "GLUCOSE", "BUN", "CREATININE", "CALCIUM", "MG", "PHOS" in  the last 168 hours.  GFR: Estimated Creatinine Clearance: 99.3 mL/min (by C-G formula based on SCr of 0.85 mg/dL). Liver Function Tests: No results for input(s): "AST", "ALT", "ALKPHOS", "BILITOT", "PROT", "ALBUMIN" in the last 168 hours. No results for input(s): "LIPASE", "AMYLASE" in the last 168 hours. No results for input(s): "AMMONIA" in the last 168 hours. Coagulation Profile: No results for input(s): "INR", "PROTIME" in  the last 168 hours. Cardiac Enzymes: No results for input(s): "CKTOTAL", "CKMB", "CKMBINDEX", "TROPONINI" in the last 168 hours. BNP (last 3 results) No results for input(s): "PROBNP" in the last 8760 hours. HbA1C: No results for input(s): "HGBA1C" in the last 72 hours. CBG: Recent Labs  Lab 01/08/23 1616 01/09/23 0731 01/09/23 1135 01/09/23 1624 01/10/23 0805  GLUCAP 105* 116* 119* 117* 100*    Lipid Profile: No results for input(s): "CHOL", "HDL", "LDLCALC", "TRIG", "CHOLHDL", "LDLDIRECT" in the last 72 hours. Thyroid Function Tests: No results for input(s): "TSH", "T4TOTAL", "FREET4", "T3FREE", "THYROIDAB" in the last 72 hours. Anemia Panel: No results for input(s): "VITAMINB12", "FOLATE", "FERRITIN", "TIBC", "IRON", "RETICCTPCT" in the last 72 hours. Sepsis Labs: No results for input(s): "PROCALCITON", "LATICACIDVEN" in the last 168 hours.  No results found for this or any previous visit (from the past 240 hour(s)).   Radiology Studies: No results found.  Scheduled Meds:  amLODipine  10 mg Oral Daily   vitamin C  1,000 mg Oral Daily   collagenase   Topical Daily   docusate sodium  100 mg Oral Daily   folic acid  1 mg Oral Daily   furosemide  40 mg Oral Daily   gabapentin  900 mg Oral TID   isosorbide mononitrate  15 mg Oral Daily   melatonin  3 mg Oral QHS   multivitamin with minerals  1 tablet Oral Daily   pantoprazole  40 mg Oral Daily   senna-docusate  1 tablet Oral BID   thiamine  100 mg Oral Daily   Continuous Infusions:  magnesium sulfate bolus IVPB       LOS: 38 days  Mana Morison M. Cruzita Lederer, MD, PhD [facility]  Between 7 am - 7 pm you can contact me via Amion (for emergencies) or [place] (non urgent matters).  I am not available 7 pm - 7 am, please contact night coverage MD/APP via Amion

## 2023-01-15 DIAGNOSIS — I96 Gangrene, not elsewhere classified: Secondary | ICD-10-CM | POA: Diagnosis not present

## 2023-01-15 NOTE — Progress Notes (Signed)
PROGRESS NOTE    [name]  [id number] DOB: [birth year] DOA: 12/07/2022 PCP: Marliss Coots, NP    Brief Narrative:  57 year old male with past medical history of chronic combined CHF, CAD, hyperlipidemia, polyneuropathy, bipolar disorder, COPD, homelessness presented with lower extremity chronic venous stasis wound with concern for superimposed infection. He was seen by orthopedic surgery and was recommended for ongoing left BKA. 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 Currently awaiting skilled nursing facility placement.     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 Sharol Given.  He has completed 2 weeks of doxycycline with a stop date 2/9 -PT consulted, recommended skilled nursing facility; [facility] consulted but having issues with placement -Pain control with Neurontin, 20 mg of oxycodone every 4 hours as needed   Active problems Hypertension -Continue Imdur, amlodipine, Lasix   Chronic combined CHF -Appears well compensated, continue Lasix 40 mg p.o. daily.  Has been refusing labs   COPD -Stable   Anxiety -Continue Atarax as needed   GERD -Continue PPI   Constipation -bowel regimen   Scheduled Meds:  amLODipine  10 mg Oral Daily   vitamin C  1,000 mg Oral Daily   collagenase   Topical Daily   docusate sodium  100 mg Oral Daily   folic acid  1 mg Oral Daily   furosemide  40 mg Oral Daily   gabapentin  900 mg Oral TID   isosorbide mononitrate  15 mg Oral Daily   melatonin  3 mg Oral QHS   multivitamin with minerals  1 tablet Oral Daily   pantoprazole  40 mg Oral Daily   senna-docusate  1 tablet Oral BID   thiamine  100 mg Oral Daily   Continuous Infusions:  magnesium sulfate bolus IVPB     PRN Meds:.albuterol, alum & mag  hydroxide-simeth, bisacodyl, guaiFENesin-dextromethorphan, hydrALAZINE, HYDROmorphone (DILAUDID) injection, hydrOXYzine, labetalol, magnesium citrate, magnesium sulfate bolus IVPB, ondansetron, oxyCODONE, phenol, polyethylene glycol, traMADol  DVT prophylaxis: SCD's Start: 12/10/22 1731    Code Status: Full Code  Disposition Plan:  Level of care: Med-Surg Status is: Inpatient Remains inpatient appropriate because: needs placement    Consultants:  ortho   Objective: Vitals:   01/13/23 1507 01/14/23 0634 01/14/23 0716 01/14/23 1517  BP: 118/73 (!) 153/101 134/88 121/85  Pulse: 70 79 87 83  Resp:  18 17 15  $ Temp: 97.7 F (36.5 C) 97.7 F (36.5 C) 98 F (36.7 C) 97.6 F (36.4 C)  TempSrc: Oral Oral Oral Oral  SpO2: 100% 97% 97% 97%  Weight:      Height:        Intake/Output Summary (Last 24 hours) at 01/15/2023 1138 Last data filed at 01/15/2023 [id number] Gross per 24 hour  Intake 960 ml  Output 3300 ml  Net -2340 ml     Filed Weights   01/05/23 0539 01/11/23 0500 01/12/23 0500  Weight: 82 kg 82.5 kg 82 kg    Examination:  Constitutional: NAD    Data Reviewed: I have personally reviewed following labs and imaging studies  CBC: Recent Labs  Lab 01/14/23 1534  WBC 6.0  HGB 12.8*  HCT 38.7*  MCV 90.8  PLT 0000000    Basic Metabolic Panel: Recent Labs  Lab 01/14/23 1534  NA 139  K 4.0  CL 105  CO2  25  GLUCOSE 122*  BUN 17  CREATININE 0.98  CALCIUM 8.9    GFR: Estimated Creatinine Clearance: 86.2 mL/min (by C-G formula based on SCr of 0.98 mg/dL). Liver Function Tests: No results for input(s): "AST", "ALT", "ALKPHOS", "BILITOT", "PROT", "ALBUMIN" in the last 168 hours. No results for input(s): "LIPASE", "AMYLASE" in the last 168 hours. No results for input(s): "AMMONIA" in the last 168 hours. Coagulation Profile: No results for input(s): "INR", "PROTIME" in the last 168 hours. Cardiac Enzymes: No results for input(s): "CKTOTAL", "CKMB",  "CKMBINDEX", "TROPONINI" in the last 168 hours. BNP (last 3 results) No results for input(s): "PROBNP" in the last 8760 hours. HbA1C: No results for input(s): "HGBA1C" in the last 72 hours. CBG: Recent Labs  Lab 01/08/23 1616 01/09/23 0731 01/09/23 1135 01/09/23 1624 01/10/23 0805  GLUCAP 105* 116* 119* 117* 100*    Lipid Profile: No results for input(s): "CHOL", "HDL", "LDLCALC", "TRIG", "CHOLHDL", "LDLDIRECT" in the last 72 hours. Thyroid Function Tests: No results for input(s): "TSH", "T4TOTAL", "FREET4", "T3FREE", "THYROIDAB" in the last 72 hours. Anemia Panel: No results for input(s): "VITAMINB12", "FOLATE", "FERRITIN", "TIBC", "IRON", "RETICCTPCT" in the last 72 hours. Sepsis Labs: No results for input(s): "PROCALCITON", "LATICACIDVEN" in the last 168 hours.  No results found for this or any previous visit (from the past 240 hour(s)).   Radiology Studies: No results found.  Scheduled Meds:  amLODipine  10 mg Oral Daily   vitamin C  1,000 mg Oral Daily   collagenase   Topical Daily   docusate sodium  100 mg Oral Daily   folic acid  1 mg Oral Daily   furosemide  40 mg Oral Daily   gabapentin  900 mg Oral TID   isosorbide mononitrate  15 mg Oral Daily   melatonin  3 mg Oral QHS   multivitamin with minerals  1 tablet Oral Daily   pantoprazole  40 mg Oral Daily   senna-docusate  1 tablet Oral BID   thiamine  100 mg Oral Daily   Continuous Infusions:  magnesium sulfate bolus IVPB       LOS: 39 days  Deyna Carbon M. Cruzita Lederer, MD, PhD [facility]  Between 7 am - 7 pm you can contact me via Amion (for emergencies) or [place] (non urgent matters).  I am not available 7 pm - 7 am, please contact night coverage MD/APP via Amion

## 2023-01-15 NOTE — Progress Notes (Signed)
Mobility Specialist Progress Note    01/15/23 1414  Mobility  Activity  (wheelchair mobility)  Level of Assistance Modified independent, requires aide device or extra time  Assistive Device Wheelchair  Distance Ambulated (ft) 500 ft  RLE Weight Bearing NWB  LLE Weight Bearing NWB  Activity Response Tolerated well  Mobility Referral Yes  $Mobility charge 1 Mobility   Pt received and agreeable to go outside. No complaints. Returned to rolling in hall on 5N.  Hildred Alamin Mobility Specialist  Please Psychologist, sport and exercise or Rehab Office at [phone]

## 2023-01-16 DIAGNOSIS — I96 Gangrene, not elsewhere classified: Secondary | ICD-10-CM | POA: Diagnosis not present

## 2023-01-16 MED ORDER — DIPHENHYDRAMINE HCL 25 MG PO CAPS
25.0000 mg | ORAL_CAPSULE | Freq: Four times a day (QID) | ORAL | Status: DC | PRN
  Administered 2023-01-16 – 2023-01-28 (×5): 25 mg via ORAL
  Filled 2023-01-16 (×6): qty 1

## 2023-01-16 MED ORDER — MORPHINE SULFATE (PF) 4 MG/ML IV SOLN
4.0000 mg | INTRAVENOUS | Status: DC | PRN
  Administered 2023-01-16 – 2023-01-17 (×4): 4 mg via INTRAVENOUS
  Filled 2023-01-16 (×4): qty 1

## 2023-01-16 NOTE — Plan of Care (Signed)
  Problem: Education: Goal: Knowledge of General Education information will improve Description: Including pain rating scale, medication(s)/side effects and non-pharmacologic comfort measures Outcome: Progressing   Problem: Nutrition: Goal: Adequate nutrition will be maintained Outcome: Progressing   Problem: Activity: Goal: Risk for activity intolerance will decrease Outcome: Progressing   

## 2023-01-16 NOTE — TOC Progression Note (Signed)
Transition of Care [facility]) - Progression Note    Patient Details  Name: [name] MRN: [id number] Date of Birth: 07/07/1966  Transition of Care [facility]) CM/SW Contact  Joanne Chars, [name] Phone Number: 01/16/2023, 11:27 AM  Clinical Narrative:  CSW spoke with [facility].  RN there had questions about pt COPD, CHF.  Discussed that pt was admitted for bka, does have those other conditions but they are stable.  She will confer with RN again and call back.   CSW spoke with Kristen/[facility] regarding antitippers for wheelchair--they can order those.  Need DME order, misc.  Under comments put: antitippers for wheelchair.  MD informed.      Expected Discharge Plan: [place] Barriers to Discharge: Continued Medical Work up, SNF Pending bed offer  Expected Discharge Plan and Services   Discharge Planning Services: CM Consult Post Acute Care Choice: [place] Living arrangements for the past 2 months: Homeless                                       Social Determinants of Health (SDOH) Interventions [place]: Food Insecurity Present (11/24/2022)  Housing: Medium Risk (12/07/2022)  Transportation Needs: Unmet Transportation Needs (12/07/2022)  Utilities: Not At Risk (12/07/2022)  Alcohol Screen: Medium Risk (06/24/2019)  Depression (PHQ2-9): Medium Risk (02/10/2021)  Tobacco Use: High Risk (12/11/2022)    Readmission Risk Interventions    12/09/2022    9:02 AM 12/01/2022    3:56 PM 08/18/2022    9:00 AM  Readmission Risk Prevention Plan  Transportation Screening Complete Complete Complete  Medication Review Press photographer) Complete Complete Complete  PCP or Specialist appointment within 3-5 days of discharge Complete Complete Complete  HRI or Home Care Consult Complete Complete Complete  SW Recovery Care/Counseling Consult Complete Complete Complete  Palliative Care Screening Not Applicable Not Applicable Not  [place] Not Applicable Not Applicable Not Applicable  SNF Comments  n/a

## 2023-01-16 NOTE — Progress Notes (Signed)
Physical Therapy Treatment Patient Details Name: [name] MRN: [id number] DOB: 1966/09/12 Today's Date: 01/16/2023   History of Present Illness Pt is a 57 y/o M who presented 12/17/22 with L ankle cellulitis and underwent L BKA on 1/10. PMH includes asthma, bioplar 1, depression, schizoaffective disorder, CVA, chronic CHF, homelessness, HTN, DM2, cocaine and cannabis use, and R AKA.    PT Comments    Pt progressing towards physical therapy goals. Pt appears agitated this session but did participate. He seemed to understand reasoning for the goal of transferring to/from the wheelchair with 1 method (lateral transfers vs A<>P transfers) in order to maximize independence and not rely on someone to move his wheelchair for him from one side of the bed to the other. He was able to demonstrate a posterior>anterior transfer to the bed, and then anterior>posterior transfer back into the wheelchair with modified independence. He reports pain in LE's when moving anteriorly but was able to complete without assistance. Although this is not his preferred method of transferring, he has demonstrated the ability to safely complete if needed. Will try to focus on lateral transfers out of the bed next session as he prefers this.    Recommendations for follow up therapy are one component of a multi-disciplinary discharge planning process, led by the attending physician.  Recommendations may be updated based on patient status, additional functional criteria and insurance authorization.  Follow Up Recommendations  Skilled nursing-short term rehab (<3 hours/day) Can patient physically be transported by private vehicle: No   Assistance Recommended at Discharge Intermittent Supervision/Assistance  Patient can return home with the following A little help with walking and/or transfers;A little help with bathing/dressing/bathroom;Assistance with cooking/housework;Assist for transportation;Help with stairs or ramp for  entrance   Equipment Recommendations  Other (comment) (Recommending L elevating leg rest for wheelchair but pt refusing it)    Recommendations for Other Services       Precautions / Restrictions Precautions Precautions: Fall Precaution Comments: R AKA, L BKA Restrictions Weight Bearing Restrictions: Yes RLE Weight Bearing: Non weight bearing LLE Weight Bearing: Non weight bearing     Mobility  Bed Mobility               General bed mobility comments: Pt was received sitting up in his wheelchair.    Transfers Overall transfer level: Modified independent Equipment used: None Transfers: Bed to chair/wheelchair/BSC         Anterior-Posterior transfers: Modified independent (Device/Increase time) (PT stabilizing w/c)   General transfer comment: Focus of session was A>P and P>A transfer to/from wheelchair. PT stabilized wheelchair for pt but he was otherwise at a mod I level. Pt reports pain in his residual limbs during transfers but he was able to complete well.    Ambulation/Gait               General Gait Details: Lexicographer mobility: Yes Wheelchair propulsion: Both upper extremities Wheelchair parts: Independent (arm rests; wheelchair does not have leg rests) Distance: 300  Modified Rankin (Stroke Patients Only)       Balance Overall balance assessment: Needs assistance Sitting-balance support: Feet unsupported, Single extremity supported Sitting balance-Leahy Scale: Good                                      Cognition Arousal/Alertness: Awake/alert  Behavior During Therapy: Agitated Overall Cognitive Status: No family/caregiver present to determine baseline cognitive functioning Area of Impairment: Attention, Memory, Safety/judgement                   Current Attention Level: Sustained Memory: Decreased recall of precautions, Decreased  short-term memory Following Commands: Follows multi-step commands consistently Safety/Judgement: Decreased awareness of safety, Decreased awareness of deficits Awareness: Emergent Problem Solving: Slow processing          Exercises      General Comments        Pertinent Vitals/Pain Pain Assessment Pain Assessment: Faces Faces Pain Scale: Hurts a little bit Pain Location: left residual limb Pain Descriptors / Indicators: Operative site guarding, Sore Pain Intervention(s): Limited activity within patient's tolerance, Monitored during session, Repositioned    Home Living                          Prior Function            PT Goals (current goals can now be found in the care plan section) Acute Rehab PT Goals Patient Stated Goal: Be able to transfer himself from w/c back to bed. PT Goal Formulation: With patient Time For Goal Achievement: 01/26/23 Potential to Achieve Goals: Good Progress towards PT goals: Progressing toward goals    Frequency    Min 1X/week      PT Plan Current plan remains appropriate    Co-evaluation              AM-PAC PT "6 Clicks" Mobility   Outcome Measure  Help needed turning from your back to your side while in a flat bed without using bedrails?: None Help needed moving from lying on your back to sitting on the side of a flat bed without using bedrails?: None Help needed moving to and from a bed to a chair (including a wheelchair)?: None Help needed standing up from a chair using your arms (e.g., wheelchair or bedside chair)?: Total Help needed to walk in hospital room?: Total Help needed climbing 3-5 steps with a railing? : Total 6 Click Score: 15    End of Session   Activity Tolerance: Patient tolerated treatment well Patient left: Other (comment) (In wheelchair mobilizing around room) Nurse Communication: Mobility status PT Visit Diagnosis: Muscle weakness (generalized) (M62.81);Difficulty in walking, not  elsewhere classified (R26.2);Other abnormalities of gait and mobility (R26.89)     Time: [id number] PT Time Calculation (min) (ACUTE ONLY): 27 min  Charges:  $Therapeutic Activity: 8-22 mins $Wheel Chair Management: 8-22 mins                     Rolinda Roan, PT, DPT Acute Rehabilitation Services Secure Chat Preferred Office: [phone]    [name] 01/16/2023, 1:15 PM

## 2023-01-16 NOTE — Progress Notes (Signed)
PROGRESS NOTE    [name]  [id number] DOB: 1965/12/24 DOA: 12/07/2022 PCP: Marliss Coots, NP    Brief Narrative:  57 year old male with past medical history of chronic combined CHF, CAD, hyperlipidemia, polyneuropathy, bipolar disorder, COPD, homelessness presented with lower extremity chronic venous stasis wound with concern for superimposed infection. He was seen by orthopedic surgery and was recommended for ongoing left BKA. 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 Currently awaiting skilled nursing facility placement.    Subjective: No overnight events.  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 Sharol Given.  He has completed 2 weeks of doxycycline with a stop date 2/9 -PT consulted, recommended skilled nursing facility; [facility] consulted but having issues with placement -Pain control with Neurontin, 20 mg of oxycodone every 4 hours as needed   Active problems Hypertension -Continue Imdur, amlodipine, Lasix   Chronic combined CHF -Appears well compensated, continue Lasix 40 mg p.o. daily.  Has been refusing labs   COPD -Stable   Anxiety -Continue Atarax as needed   GERD -Continue PPI   Constipation -bowel regimen   Scheduled Meds:  amLODipine  10 mg Oral Daily   vitamin C  1,000 mg Oral Daily   collagenase   Topical Daily   docusate sodium  100 mg Oral Daily   folic acid  1 mg Oral Daily   furosemide  40 mg Oral Daily   gabapentin  900 mg Oral TID   isosorbide mononitrate  15 mg Oral Daily   melatonin  3 mg Oral QHS   multivitamin with minerals  1 tablet Oral Daily   pantoprazole  40 mg Oral Daily   thiamine  100 mg Oral Daily   Continuous Infusions:  magnesium sulfate bolus IVPB     PRN Meds:.albuterol, alum & mag hydroxide-simeth,  bisacodyl, diphenhydrAMINE, guaiFENesin-dextromethorphan, hydrALAZINE, hydrOXYzine, labetalol, magnesium citrate, magnesium sulfate bolus IVPB, morphine injection, ondansetron, oxyCODONE, phenol, polyethylene glycol, traMADol  DVT prophylaxis: SCD's Start: 12/10/22 1731    Code Status: Full Code  Disposition Plan:  Level of care: Med-Surg Status is: Inpatient Remains inpatient appropriate because: needs placement    Consultants:  ortho   Objective: Vitals:   01/14/23 1517 01/15/23 1507 01/15/23 2251 01/16/23 0756  BP: 121/85 (!) 138/93 (!) 155/89 (!) 138/90  Pulse: 83 77 88 85  Resp: 15 17 18 19  $ Temp: 97.6 F (36.4 C) 98.3 F (36.8 C)  98.8 F (37.1 C)  TempSrc: Oral Oral Oral Oral  SpO2: 97% 100% 97% 100%  Weight:      Height:        Intake/Output Summary (Last 24 hours) at 01/16/2023 1050 Last data filed at 01/16/2023 0700 Gross per 24 hour  Intake 600 ml  Output 650 ml  Net -50 ml     Filed Weights   01/05/23 0539 01/11/23 0500 01/12/23 0500  Weight: 82 kg 82.5 kg 82 kg    Examination:  Constitutional: NAD Respiratory: CTA Cardiovascular: RRR   Data Reviewed: I have personally reviewed following labs and imaging studies  CBC: Recent Labs  Lab 01/14/23 1534  WBC 6.0  HGB 12.8*  HCT 38.7*  MCV 90.8  PLT 254     Basic Metabolic Panel: Recent Labs  Lab 01/14/23 1534  NA 139  K 4.0  CL 105  CO2  25  GLUCOSE 122*  BUN 17  CREATININE 0.98  CALCIUM 8.9     GFR: Estimated Creatinine Clearance: 86.2 mL/min (by C-G formula based on SCr of 0.98 mg/dL). Liver Function Tests: No results for input(s): "AST", "ALT", "ALKPHOS", "BILITOT", "PROT", "ALBUMIN" in the last 168 hours. No results for input(s): "LIPASE", "AMYLASE" in the last 168 hours. No results for input(s): "AMMONIA" in the last 168 hours. Coagulation Profile: No results for input(s): "INR", "PROTIME" in the last 168 hours. Cardiac Enzymes: No results for input(s): "CKTOTAL",  "CKMB", "CKMBINDEX", "TROPONINI" in the last 168 hours. BNP (last 3 results) No results for input(s): "PROBNP" in the last 8760 hours. HbA1C: No results for input(s): "HGBA1C" in the last 72 hours. CBG: Recent Labs  Lab 01/09/23 1135 01/09/23 1624 01/10/23 0805  GLUCAP 119* 117* 100*    Lipid Profile: No results for input(s): "CHOL", "HDL", "LDLCALC", "TRIG", "CHOLHDL", "LDLDIRECT" in the last 72 hours. Thyroid Function Tests: No results for input(s): "TSH", "T4TOTAL", "FREET4", "T3FREE", "THYROIDAB" in the last 72 hours. Anemia Panel: No results for input(s): "VITAMINB12", "FOLATE", "FERRITIN", "TIBC", "IRON", "RETICCTPCT" in the last 72 hours. Sepsis Labs: No results for input(s): "PROCALCITON", "LATICACIDVEN" in the last 168 hours.  No results found for this or any previous visit (from the past 240 hour(s)).   Radiology Studies: No results found.  Scheduled Meds:  amLODipine  10 mg Oral Daily   vitamin C  1,000 mg Oral Daily   collagenase   Topical Daily   docusate sodium  100 mg Oral Daily   folic acid  1 mg Oral Daily   furosemide  40 mg Oral Daily   gabapentin  900 mg Oral TID   isosorbide mononitrate  15 mg Oral Daily   melatonin  3 mg Oral QHS   multivitamin with minerals  1 tablet Oral Daily   pantoprazole  40 mg Oral Daily   thiamine  100 mg Oral Daily   Continuous Infusions:  magnesium sulfate bolus IVPB       LOS: 40 days  Lester Platas M. Cruzita Lederer, MD, PhD [facility]  Between 7 am - 7 pm you can contact me via Amion (for emergencies) or [place] (non urgent matters).  I am not available 7 pm - 7 am, please contact night coverage MD/APP via Amion

## 2023-01-16 NOTE — Progress Notes (Signed)
OT Cancellation Note  Patient Details Name: [name] MRN: [id number] DOB: 04-18-1966   Cancelled Treatment:    Reason Eval/Treat Not Completed: Other (comment) (Pt not available. Off unit and outside with NT.)  Ailene Ravel, OTR/L,CBIS  Supplemental OT - [facility] and [facility] Secure Chat Preferred   01/16/2023, 4:05 PM

## 2023-01-17 DIAGNOSIS — I96 Gangrene, not elsewhere classified: Secondary | ICD-10-CM | POA: Diagnosis not present

## 2023-01-17 MED ORDER — MORPHINE SULFATE 15 MG PO TABS
15.0000 mg | ORAL_TABLET | ORAL | Status: DC | PRN
  Administered 2023-01-17: 15 mg via ORAL
  Filled 2023-01-17: qty 1

## 2023-01-17 NOTE — Progress Notes (Signed)
PROGRESS NOTE    [name]  [id number] DOB: 1966-03-18 DOA: 12/07/2022 PCP: Marliss Coots, NP    Brief Narrative:  57 year old male with past medical history of chronic combined CHF, CAD, hyperlipidemia, polyneuropathy, bipolar disorder, COPD, homelessness presented with lower extremity chronic venous stasis wound with concern for superimposed infection. He was seen by orthopedic surgery and was recommended for ongoing left BKA. 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 Currently awaiting skilled nursing facility placement.    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 Sharol Given.  He has completed 2 weeks of doxycycline with a stop date 2/9 -PT consulted, recommended skilled nursing facility; [facility] consulted but having issues with placement -Pain control with Neurontin, oxycodone alternating with morphine   Active problems Hypertension -Continue Imdur, amlodipine, Lasix   Chronic combined CHF -Appears well compensated, continue Lasix 40 mg p.o. daily.  Has been refusing labs   COPD -Stable   Anxiety -Continue Atarax as needed   GERD -Continue PPI   Constipation -bowel regimen   Scheduled Meds:  amLODipine  10 mg Oral Daily   vitamin C  1,000 mg Oral Daily   collagenase   Topical Daily   docusate sodium  100 mg Oral Daily   folic acid  1 mg Oral Daily   furosemide  40 mg Oral Daily   gabapentin  900 mg Oral TID   isosorbide mononitrate  15 mg Oral Daily   melatonin  3 mg Oral QHS   multivitamin with minerals  1 tablet Oral Daily   pantoprazole  40 mg Oral Daily   thiamine  100 mg Oral Daily   Continuous Infusions:  magnesium sulfate bolus IVPB     PRN Meds:.albuterol, alum & mag hydroxide-simeth, bisacodyl, diphenhydrAMINE, guaiFENesin-dextromethorphan, hydrALAZINE,  hydrOXYzine, labetalol, magnesium citrate, magnesium sulfate bolus IVPB, morphine, ondansetron, oxyCODONE, phenol, polyethylene glycol, traMADol  DVT prophylaxis: SCD's Start: 12/10/22 1731    Code Status: Full Code  Disposition Plan:  Level of care: Med-Surg Status is: Inpatient Remains inpatient appropriate because: needs placement    Consultants:  ortho   Objective: Vitals:   01/15/23 2251 01/16/23 0756 01/16/23 2000 01/17/23 0751  BP: (!) 155/89 (!) 138/90 133/88 122/75  Pulse: 88 85 88 85  Resp: 18 19 20 19  $ Temp:  98.8 F (37.1 C) 98.7 F (37.1 C) 98.8 F (37.1 C)  TempSrc: Oral Oral Oral Oral  SpO2: 97% 100% 100% 98%  Weight:      Height:        Intake/Output Summary (Last 24 hours) at 01/17/2023 1015 Last data filed at 01/17/2023 0900 Gross per 24 hour  Intake 480 ml  Output --  Net 480 ml     Filed Weights   01/05/23 0539 01/11/23 0500 01/12/23 0500  Weight: 82 kg 82.5 kg 82 kg    Examination:  Constitutional: NAD Respiratory: CTA Cardiovascular: RRR   Data Reviewed: I have personally reviewed following labs and imaging studies  CBC: Recent Labs  Lab 01/14/23 1534  WBC 6.0  HGB 12.8*  HCT 38.7*  MCV 90.8  PLT 254     Basic Metabolic Panel: Recent Labs  Lab 01/14/23 1534  NA 139  K 4.0  CL 105  CO2 25  GLUCOSE 122*  BUN 17  CREATININE 0.98  CALCIUM 8.9     GFR: Estimated Creatinine Clearance: 86.2  mL/min (by C-G formula based on SCr of 0.98 mg/dL). Liver Function Tests: No results for input(s): "AST", "ALT", "ALKPHOS", "BILITOT", "PROT", "ALBUMIN" in the last 168 hours. No results for input(s): "LIPASE", "AMYLASE" in the last 168 hours. No results for input(s): "AMMONIA" in the last 168 hours. Coagulation Profile: No results for input(s): "INR", "PROTIME" in the last 168 hours. Cardiac Enzymes: No results for input(s): "CKTOTAL", "CKMB", "CKMBINDEX", "TROPONINI" in the last 168 hours. BNP (last 3 results) No results  for input(s): "PROBNP" in the last 8760 hours. HbA1C: No results for input(s): "HGBA1C" in the last 72 hours. CBG: No results for input(s): "GLUCAP" in the last 168 hours.  Lipid Profile: No results for input(s): "CHOL", "HDL", "LDLCALC", "TRIG", "CHOLHDL", "LDLDIRECT" in the last 72 hours. Thyroid Function Tests: No results for input(s): "TSH", "T4TOTAL", "FREET4", "T3FREE", "THYROIDAB" in the last 72 hours. Anemia Panel: No results for input(s): "VITAMINB12", "FOLATE", "FERRITIN", "TIBC", "IRON", "RETICCTPCT" in the last 72 hours. Sepsis Labs: No results for input(s): "PROCALCITON", "LATICACIDVEN" in the last 168 hours.  No results found for this or any previous visit (from the past 240 hour(s)).   Radiology Studies: No results found.  Scheduled Meds:  amLODipine  10 mg Oral Daily   vitamin C  1,000 mg Oral Daily   collagenase   Topical Daily   docusate sodium  100 mg Oral Daily   folic acid  1 mg Oral Daily   furosemide  40 mg Oral Daily   gabapentin  900 mg Oral TID   isosorbide mononitrate  15 mg Oral Daily   melatonin  3 mg Oral QHS   multivitamin with minerals  1 tablet Oral Daily   pantoprazole  40 mg Oral Daily   thiamine  100 mg Oral Daily   Continuous Infusions:  magnesium sulfate bolus IVPB       LOS: 41 days  Marlicia Sroka M. Cruzita Lederer, MD, PhD [facility]  Between 7 am - 7 pm you can contact me via Amion (for emergencies) or [place] (non urgent matters).  I am not available 7 pm - 7 am, please contact night coverage MD/APP via Amion

## 2023-01-17 NOTE — Progress Notes (Signed)
Mobility Specialist Progress Note    01/17/23 1621  Mobility  Activity  (wheelchair mobility)  Level of Assistance Modified independent, requires aide device or extra time  Assistive Device Wheelchair  Distance Ambulated (ft) 400 ft (propelled)  RLE Weight Bearing NWB  LLE Weight Bearing NWB  Activity Response Tolerated well  Mobility Referral Yes  $Mobility charge 1 Mobility   Pt received in bed and agreeable. No complaints. Returned to room with call bell in reach.   Hildred Alamin Mobility Specialist  Please Psychologist, sport and exercise or Rehab Office at [phone]

## 2023-01-18 DIAGNOSIS — I96 Gangrene, not elsewhere classified: Secondary | ICD-10-CM | POA: Diagnosis not present

## 2023-01-18 MED ORDER — MORPHINE SULFATE (PF) 4 MG/ML IV SOLN
4.0000 mg | INTRAVENOUS | Status: DC | PRN
  Administered 2023-01-18 – 2023-01-27 (×49): 4 mg via INTRAVENOUS
  Filled 2023-01-18 (×49): qty 1

## 2023-01-18 NOTE — Plan of Care (Signed)
°  Problem: Fluid Volume: °Goal: Hemodynamic stability will improve °Outcome: Progressing °  °Problem: Clinical Measurements: °Goal: Diagnostic test results will improve °Outcome: Progressing °Goal: Signs and symptoms of infection will decrease °Outcome: Progressing °  °

## 2023-01-18 NOTE — Progress Notes (Signed)
PROGRESS NOTE    [name]  [id number] DOB: 02-Jan-1966 DOA: 12/07/2022 PCP: Marliss Coots, NP    Brief Narrative:  57 year old male with past medical history of chronic combined CHF, CAD, hyperlipidemia, polyneuropathy, bipolar disorder, COPD, homelessness presented with lower extremity chronic venous stasis wound with concern for superimposed infection. He was seen by orthopedic surgery and was recommended for ongoing left BKA. 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 Currently awaiting skilled nursing facility placement.    Subjective: No events overnight.  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 Sharol Given.  He has completed 2 weeks of doxycycline with a stop date 2/9 -PT consulted, recommended skilled nursing facility; [facility] consulted but having issues with placement -Pain control with Neurontin, oxycodone alternating with morphine   Active problems Hypertension -Continue Imdur, amlodipine, Lasix   Chronic combined CHF -Appears well compensated, continue Lasix 40 mg p.o. daily.  Has been refusing labs   COPD -Stable   Anxiety -Continue Atarax as needed   GERD -Continue PPI   Constipation -bowel regimen   Scheduled Meds:  amLODipine  10 mg Oral Daily   vitamin C  1,000 mg Oral Daily   collagenase   Topical Daily   docusate sodium  100 mg Oral Daily   folic acid  1 mg Oral Daily   furosemide  40 mg Oral Daily   gabapentin  900 mg Oral TID   isosorbide mononitrate  15 mg Oral Daily   melatonin  3 mg Oral QHS   multivitamin with minerals  1 tablet Oral Daily   pantoprazole  40 mg Oral Daily   thiamine  100 mg Oral Daily   Continuous Infusions:  magnesium sulfate bolus IVPB     PRN Meds:.albuterol, alum & mag hydroxide-simeth, bisacodyl,  diphenhydrAMINE, guaiFENesin-dextromethorphan, hydrALAZINE, hydrOXYzine, labetalol, magnesium citrate, magnesium sulfate bolus IVPB, morphine injection, ondansetron, oxyCODONE, phenol, polyethylene glycol, traMADol  DVT prophylaxis: SCD's Start: 12/10/22 1731    Code Status: Full Code  Disposition Plan:  Level of care: Med-Surg Status is: Inpatient Remains inpatient appropriate because: needs placement    Consultants:  ortho   Objective: Vitals:   01/17/23 0751 01/17/23 1648 01/17/23 1932 01/18/23 0450  BP: 122/75 (!) 126/110 (!) 141/79 (!) 150/91  Pulse: 85 90 79 79  Resp: 19 18 16 18  $ Temp: 98.8 F (37.1 C) 98.5 F (36.9 C) 98 F (36.7 C) 98 F (36.7 C)  TempSrc: Oral Oral Oral Oral  SpO2: 98% 100% 99% 99%  Weight:      Height:        Intake/Output Summary (Last 24 hours) at 01/18/2023 1049 Last data filed at 01/18/2023 0900 Gross per 24 hour  Intake 360 ml  Output 2700 ml  Net -2340 ml     Filed Weights   01/05/23 0539 01/11/23 0500 01/12/23 0500  Weight: 82 kg 82.5 kg 82 kg    Examination:  Constitutional: NAD Respiratory: CTA Cardiovascular: RRR   Data Reviewed: I have personally reviewed following labs and imaging studies  CBC: Recent Labs  Lab 01/14/23 1534  WBC 6.0  HGB 12.8*  HCT 38.7*  MCV 90.8  PLT 254     Basic Metabolic Panel: Recent Labs  Lab 01/14/23 1534  NA 139  K 4.0  CL 105  CO2 25  GLUCOSE 122*  BUN 17  CREATININE 0.98  CALCIUM 8.9     GFR: Estimated Creatinine Clearance: 86.2 mL/min (by C-G formula based on SCr of 0.98 mg/dL). Liver Function Tests: No results for input(s): "AST", "ALT", "ALKPHOS", "BILITOT", "PROT", "ALBUMIN" in the last 168 hours. No results for input(s): "LIPASE", "AMYLASE" in the last 168 hours. No results for input(s): "AMMONIA" in the last 168 hours. Coagulation Profile: No results for input(s): "INR", "PROTIME" in the last 168 hours. Cardiac Enzymes: No results for input(s):  "CKTOTAL", "CKMB", "CKMBINDEX", "TROPONINI" in the last 168 hours. BNP (last 3 results) No results for input(s): "PROBNP" in the last 8760 hours. HbA1C: No results for input(s): "HGBA1C" in the last 72 hours. CBG: No results for input(s): "GLUCAP" in the last 168 hours.  Lipid Profile: No results for input(s): "CHOL", "HDL", "LDLCALC", "TRIG", "CHOLHDL", "LDLDIRECT" in the last 72 hours. Thyroid Function Tests: No results for input(s): "TSH", "T4TOTAL", "FREET4", "T3FREE", "THYROIDAB" in the last 72 hours. Anemia Panel: No results for input(s): "VITAMINB12", "FOLATE", "FERRITIN", "TIBC", "IRON", "RETICCTPCT" in the last 72 hours. Sepsis Labs: No results for input(s): "PROCALCITON", "LATICACIDVEN" in the last 168 hours.  No results found for this or any previous visit (from the past 240 hour(s)).   Radiology Studies: No results found.  Scheduled Meds:  amLODipine  10 mg Oral Daily   vitamin C  1,000 mg Oral Daily   collagenase   Topical Daily   docusate sodium  100 mg Oral Daily   folic acid  1 mg Oral Daily   furosemide  40 mg Oral Daily   gabapentin  900 mg Oral TID   isosorbide mononitrate  15 mg Oral Daily   melatonin  3 mg Oral QHS   multivitamin with minerals  1 tablet Oral Daily   pantoprazole  40 mg Oral Daily   thiamine  100 mg Oral Daily   Continuous Infusions:  magnesium sulfate bolus IVPB       LOS: 42 days  Lexander Tremblay M. Cruzita Lederer, MD, PhD [facility]  Between 7 am - 7 pm you can contact me via Amion (for emergencies) or [place] (non urgent matters).  I am not available 7 pm - 7 am, please contact night coverage MD/APP via Amion

## 2023-01-19 DIAGNOSIS — I96 Gangrene, not elsewhere classified: Secondary | ICD-10-CM | POA: Diagnosis not present

## 2023-01-19 NOTE — Progress Notes (Signed)
Mobility Specialist Progress Note    01/19/23 1006  Mobility  Activity Transferred from bed to chair  Level of Assistance Standby assist, set-up cues, supervision of patient - no hands on  Assistive Device Wheelchair  RLE Weight Bearing NWB  LLE Weight Bearing NWB  Activity Response Tolerated well  Mobility Referral Yes  $Mobility charge 1 Mobility   Pt received in bed requesting to get into WC. No complaints. Successfully completed posterior scoot into chair. Pt then propelling himself to BR to dump urinals then around unit.   Hildred Alamin Mobility Specialist  Please Psychologist, sport and exercise or Rehab Office at [phone]

## 2023-01-19 NOTE — TOC Progression Note (Signed)
Transition of Care [facility]) - Progression Note    Patient Details  Name: Alexandra Mosqueda MRN: [id number] Date of Birth: [birth year]  Transition of Care [facility]) CM/SW Contact  Joanne Chars, [name] Phone Number: 01/19/2023, 10:15 AM  Clinical Narrative:   CSW spoke with Kristen/[employer]--they have to order the antitippers--confirmed that they need to be brought to the hospital room.  She will check status and make sure they are delivered here.    Expected Discharge Plan: [place] Barriers to Discharge: Continued Medical Work up, SNF Pending bed offer  Expected Discharge Plan and Services   Discharge Planning Services: CM Consult Post Acute Care Choice: [place] Living arrangements for the past 2 months: Homeless                                       Social Determinants of Health (SDOH) Interventions [place]: Food Insecurity Present (11/24/2022)  Housing: Medium Risk (12/07/2022)  Transportation Needs: Unmet Transportation Needs (12/07/2022)  Utilities: Not At Risk (12/07/2022)  Alcohol Screen: Medium Risk (06/24/2019)  Depression (PHQ2-9): Medium Risk (02/10/2021)  Tobacco Use: High Risk (12/11/2022)    Readmission Risk Interventions    12/09/2022    9:02 AM 12/01/2022    3:56 PM 08/18/2022    9:00 AM  Readmission Risk Prevention Plan  Transportation Screening Complete Complete Complete  Medication Review Press photographer) Complete Complete Complete  PCP or Specialist appointment within 3-5 days of discharge Complete Complete Complete  HRI or Home Care Consult Complete Complete Complete  SW Recovery Care/Counseling Consult Complete Complete Complete  Palliative Care Screening Not Applicable Not Applicable Not [place] Not Applicable Not Applicable Not Applicable  SNF Comments  n/a

## 2023-01-19 NOTE — Progress Notes (Signed)
Physical Therapy Treatment Patient Details Name: [name] MRN: [id number] DOB: Aug 19, 1966 Today's Date: 01/19/2023   History of Present Illness Pt is a 57 y/o M who presented 12/17/22 with L ankle cellulitis and underwent L BKA on 1/10. PMH includes asthma, bioplar 1, depression, schizoaffective disorder, CVA, chronic CHF, homelessness, HTN, DM2, cocaine and cannabis use, and R AKA.    PT Comments    Continuing work on functional mobility and activity tolerance;  Session focused on therapeutic exercise to strengthen hip, knee extensors and lengthen/stretch hip and knee flexors; Reports some knee discomfort in prone positioning, but able to tolerate enough to perform alternating hip extension therex;   Being new to [place], took time to discuss plans and goals; He indicated to me that he must be independent to get to drug rehab, and he wants to continue work on that; He verbalized the need to be able to transfer to a variety of surfaces in a variety of situations; He also indicated he wants to work on getting prostheses; Will plan to discuss his case with a prosthetist.   Recommendations for follow up therapy are one component of a multi-disciplinary discharge planning process, led by the attending physician.  Recommendations may be updated based on patient status, additional functional criteria and insurance authorization.  Follow Up Recommendations  Skilled nursing-short term rehab (<3 hours/day) (Pursuing drug addiction rehab, must be modified independent) Can patient physically be transported by private vehicle:  (Yes, but would need assist with lateral scooting wc <>car)   Assistance Recommended at Discharge Intermittent Supervision/Assistance  Patient can return home with the following A little help with walking and/or transfers;A little help with bathing/dressing/bathroom;Assistance with cooking/housework;Assist for transportation;Help with stairs or ramp for entrance   Equipment  Recommendations  Other (comment) (Recommending L elevating leg rest for wheelchair but pt refusing it)    Recommendations for Other Services       Precautions / Restrictions Precautions Precautions: Fall Precaution Comments: R AKA, L BKA     Mobility  Bed Mobility Overal bed mobility: Modified Independent             General bed mobility comments: able to get into and out of prone position with incr time    Transfers                        Ambulation/Gait                   Stairs             Wheelchair Mobility    Modified Rankin (Stroke Patients Only)       Balance     Sitting balance-Leahy Scale: Good                                      Cognition Arousal/Alertness: Awake/alert Behavior During Therapy: WFL for tasks assessed/performed Overall Cognitive Status: No family/caregiver present to determine baseline cognitive functioning                                 General Comments: Pt was able to relay back previous education provided to him on requirements functionally for drug rehab, and asked about working on transfers to other surfaces (straight to toilet via AP transfer)        Exercises Amputee Exercises Chair  Push Up: AROM, 10 reps (in wheelchair; cued pt to set brakes prior to performing push ups) Other Exercises Other Exercises: Bolstered bridging x10 Other Exercises: Seated, A/ROM left knee flexion/extension, 10X Other Exercises: seated L hamstring stretching 3 reps with 10 second hold Other Exercises: Prone hip extension, alternating, x5 reps each leg    General Comments General comments (skin integrity, edema, etc.): NAD noted during exercises      Pertinent Vitals/Pain Pain Assessment Pain Assessment: Faces Faces Pain Scale: Hurts a little bit Pain Location: left residual limb, and reports knee discomfort when in porne position Pain Descriptors / Indicators: Discomfort Pain  Intervention(s): Monitored during session    Home Living                          Prior Function            PT Goals (current goals can now be found in the care plan section) Acute Rehab PT Goals Patient Stated Goal: Be able to transfer himself from w/c back to bed, and to other places/surfaces PT Goal Formulation: With patient Time For Goal Achievement: 01/26/23 Potential to Achieve Goals: Good Progress towards PT goals: Progressing toward goals    Frequency    Min 1X/week      PT Plan Current plan remains appropriate    Co-evaluation              AM-PAC PT "6 Clicks" Mobility   Outcome Measure  Help needed turning from your back to your side while in a flat bed without using bedrails?: None Help needed moving from lying on your back to sitting on the side of a flat bed without using bedrails?: None Help needed moving to and from a bed to a chair (including a wheelchair)?: None Help needed standing up from a chair using your arms (e.g., wheelchair or bedside chair)?: Total Help needed to walk in hospital room?: Total Help needed climbing 3-5 steps with a railing? : Total 6 Click Score: 15    End of Session   Activity Tolerance: Patient tolerated treatment well Patient left: in bed;with call bell/phone within reach Nurse Communication: Mobility status PT Visit Diagnosis: Muscle weakness (generalized) (M62.81);Difficulty in walking, not elsewhere classified (R26.2);Other abnormalities of gait and mobility (R26.89)     Time: [id number] PT Time Calculation (min) (ACUTE ONLY): 25 min  Charges:  $Therapeutic Exercise: 8-22 mins $Therapeutic Activity: 8-22 mins                     Roney Marion, PT  Acute Rehabilitation Services Office [phone]    [place]letta [place] 01/19/2023, 6:55 PM

## 2023-01-19 NOTE — Progress Notes (Signed)
PROGRESS NOTE    [name]  [id number] DOB: 1966/02/03 DOA: 12/07/2022 PCP: Marliss Coots, NP    Brief Narrative:  57 year old male with past medical history of chronic combined CHF, CAD, hyperlipidemia, polyneuropathy, bipolar disorder, COPD, homelessness presented with lower extremity chronic venous stasis wound with concern for superimposed infection. He was seen by orthopedic surgery and was recommended for ongoing left BKA. 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 Currently awaiting skilled nursing facility placement.    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 Sharol Given.  He has completed 2 weeks of doxycycline with a stop date 2/9 -PT consulted, recommended skilled nursing facility; [facility] consulted but having issues with placement -Pain control with Neurontin, oxycodone alternating with morphine   Active problems Hypertension -Continue Imdur, amlodipine, Lasix   Chronic combined CHF -Appears well compensated, continue Lasix 40 mg p.o. daily.  Has been refusing labs   COPD -Stable   Anxiety -Continue Atarax as needed   GERD -Continue PPI   Constipation -bowel regimen   Scheduled Meds:  amLODipine  10 mg Oral Daily   vitamin C  1,000 mg Oral Daily   collagenase   Topical Daily   docusate sodium  100 mg Oral Daily   folic acid  1 mg Oral Daily   furosemide  40 mg Oral Daily   gabapentin  900 mg Oral TID   isosorbide mononitrate  15 mg Oral Daily   melatonin  3 mg Oral QHS   multivitamin with minerals  1 tablet Oral Daily   pantoprazole  40 mg Oral Daily   thiamine  100 mg Oral Daily   Continuous Infusions:  magnesium sulfate bolus IVPB     PRN Meds:.albuterol, alum & mag hydroxide-simeth, bisacodyl, diphenhydrAMINE, guaiFENesin-dextromethorphan, hydrALAZINE, hydrOXYzine,  labetalol, magnesium citrate, magnesium sulfate bolus IVPB, morphine injection, ondansetron, oxyCODONE, phenol, polyethylene glycol, traMADol  DVT prophylaxis: SCD's Start: 12/10/22 1731    Code Status: Full Code  Disposition Plan:  Level of care: Med-Surg Status is: Inpatient Remains inpatient appropriate because: needs placement    Consultants:  ortho   Objective: Vitals:   01/18/23 0450 01/18/23 1513 01/18/23 2054 01/19/23 0727  BP: (!) 150/91 119/83 (!) 118/98 (!) 123/107  Pulse: 79 93 79 85  Resp: 18 15 18   $ Temp: 98 F (36.7 C) 98.2 F (36.8 C) 98.6 F (37 C) (!) 97.5 F (36.4 C)  TempSrc: Oral Oral Oral Oral  SpO2: 99% 100% 100% 98%  Weight:      Height:        Intake/Output Summary (Last 24 hours) at 01/19/2023 0957 Last data filed at 01/18/2023 1700 Gross per 24 hour  Intake 720 ml  Output 800 ml  Net -80 ml     Filed Weights   01/05/23 0539 01/11/23 0500 01/12/23 0500  Weight: 82 kg 82.5 kg 82 kg    Examination:  Constitutional: NAD Respiratory: CTA Cardiovascular: RRR   Data Reviewed: I have personally reviewed following labs and imaging studies  CBC: Recent Labs  Lab 01/14/23 1534  WBC 6.0  HGB 12.8*  HCT 38.7*  MCV 90.8  PLT 254     Basic Metabolic Panel: Recent Labs  Lab 01/14/23 1534  NA 139  K 4.0  CL 105  CO2 25  GLUCOSE 122*  BUN 17  CREATININE 0.98  CALCIUM 8.9  GFR: Estimated Creatinine Clearance: 86.2 mL/min (by C-G formula based on SCr of 0.98 mg/dL). Liver Function Tests: No results for input(s): "AST", "ALT", "ALKPHOS", "BILITOT", "PROT", "ALBUMIN" in the last 168 hours. No results for input(s): "LIPASE", "AMYLASE" in the last 168 hours. No results for input(s): "AMMONIA" in the last 168 hours. Coagulation Profile: No results for input(s): "INR", "PROTIME" in the last 168 hours. Cardiac Enzymes: No results for input(s): "CKTOTAL", "CKMB", "CKMBINDEX", "TROPONINI" in the last 168 hours. BNP (last 3  results) No results for input(s): "PROBNP" in the last 8760 hours. HbA1C: No results for input(s): "HGBA1C" in the last 72 hours. CBG: No results for input(s): "GLUCAP" in the last 168 hours.  Lipid Profile: No results for input(s): "CHOL", "HDL", "LDLCALC", "TRIG", "CHOLHDL", "LDLDIRECT" in the last 72 hours. Thyroid Function Tests: No results for input(s): "TSH", "T4TOTAL", "FREET4", "T3FREE", "THYROIDAB" in the last 72 hours. Anemia Panel: No results for input(s): "VITAMINB12", "FOLATE", "FERRITIN", "TIBC", "IRON", "RETICCTPCT" in the last 72 hours. Sepsis Labs: No results for input(s): "PROCALCITON", "LATICACIDVEN" in the last 168 hours.  No results found for this or any previous visit (from the past 240 hour(s)).   Radiology Studies: No results found.  Scheduled Meds:  amLODipine  10 mg Oral Daily   vitamin C  1,000 mg Oral Daily   collagenase   Topical Daily   docusate sodium  100 mg Oral Daily   folic acid  1 mg Oral Daily   furosemide  40 mg Oral Daily   gabapentin  900 mg Oral TID   isosorbide mononitrate  15 mg Oral Daily   melatonin  3 mg Oral QHS   multivitamin with minerals  1 tablet Oral Daily   pantoprazole  40 mg Oral Daily   thiamine  100 mg Oral Daily   Continuous Infusions:  magnesium sulfate bolus IVPB       LOS: 43 days  Kandiss Ihrig M. Cruzita Lederer, MD, PhD [facility]  Between 7 am - 7 pm you can contact me via Amion (for emergencies) or [place] (non urgent matters).  I am not available 7 pm - 7 am, please contact night coverage MD/APP via Amion

## 2023-01-19 NOTE — Progress Notes (Addendum)
10am: CSW spoke with patient at bedside to discuss discharge plan. Patient reports he is interested in going to [facility] at discharge for substance abuse treatment. Patient states he is agreeable to go to SNF if [facility] doesn't work out.  CSW spoke with Butch Penny at [facility] who states patient's clinicals are still under review and she will return call to [place] when a decision is made.  9am: Patient has been added to [facility] list for discharge planning.   Please contact me directly for any updates or questions regarding discharge planning.  Patient's clinicals are currently under review by staff at [facility] will follow up on referral.  Madilyn Fireman, MSW, [name] Transitions of Care  Clinical Social Worker II [phone]

## 2023-01-19 NOTE — Plan of Care (Signed)
  Problem: Fluid Volume: Goal: Hemodynamic stability will improve Outcome: Adequate for Discharge   Problem: Clinical Measurements: Goal: Diagnostic test results will improve Outcome: Adequate for Discharge Goal: Signs and symptoms of infection will decrease Outcome: Adequate for Discharge

## 2023-01-20 DIAGNOSIS — I96 Gangrene, not elsewhere classified: Secondary | ICD-10-CM | POA: Diagnosis not present

## 2023-01-20 NOTE — Plan of Care (Signed)
  Problem: Education: Goal: Knowledge of the prescribed therapeutic regimen will improve Outcome: Progressing   Problem: Activity: Goal: Ability to perform//tolerate increased activity and mobilize with assistive devices will improve Outcome: Progressing   Problem: Pain Management: Goal: Pain level will decrease with appropriate interventions Outcome: Progressing   Problem: Skin Integrity: Goal: Demonstration of wound healing without infection will improve Outcome: Progressing

## 2023-01-20 NOTE — Progress Notes (Addendum)
CSW spoke with [place], RN of [facility] who states that there are not any housing options available at this time other than [employer] or [facility]. [place] suggested [place] contact [facility] for additional support.  CSW spoke with representative at [employer] who states the only waiting list that is accepting applications right now is for applicants ages 42 and older.  CSW spoke with patient via phone to update him on information obtained earlier. Patient stated understanding and did not have questions.  Madilyn Fireman, MSW, [name] Transitions of Care  Clinical Social Worker II [phone]

## 2023-01-20 NOTE — Plan of Care (Signed)

## 2023-01-20 NOTE — Progress Notes (Signed)
PROGRESS NOTE    [name]  [id number] DOB: 04-18-1966 DOA: 12/07/2022 PCP: Marliss Coots, NP    Brief Narrative:  57 year old male with past medical history of chronic combined CHF, CAD, hyperlipidemia, polyneuropathy, bipolar disorder, COPD, homelessness presented with lower extremity chronic venous stasis wound with concern for superimposed infection. He was seen by orthopedic surgery and was recommended for ongoing left BKA. 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 Currently awaiting skilled nursing facility placement.    Subjective: No events.  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 Sharol Given.  He has completed 2 weeks of doxycycline with a stop date 2/9 -PT consulted, recommended skilled nursing facility; [facility] consulted but having issues with placement -Pain control with Neurontin, oxycodone alternating with morphine   Active problems Hypertension -Continue Imdur, amlodipine, Lasix.  Blood pressure acceptable   Chronic combined CHF -Appears well compensated, continue Lasix 40 mg p.o. daily.  Has been intermittently refusing labs   COPD -Stable   Anxiety -Continue Atarax as needed   GERD -Continue PPI   Constipation -bowel regimen   Scheduled Meds:  amLODipine  10 mg Oral Daily   vitamin C  1,000 mg Oral Daily   collagenase   Topical Daily   docusate sodium  100 mg Oral Daily   folic acid  1 mg Oral Daily   furosemide  40 mg Oral Daily   gabapentin  900 mg Oral TID   isosorbide mononitrate  15 mg Oral Daily   melatonin  3 mg Oral QHS   multivitamin with minerals  1 tablet Oral Daily   pantoprazole  40 mg Oral Daily   thiamine  100 mg Oral Daily   Continuous Infusions:  magnesium sulfate bolus IVPB     PRN Meds:.albuterol, alum & mag  hydroxide-simeth, bisacodyl, diphenhydrAMINE, guaiFENesin-dextromethorphan, hydrALAZINE, hydrOXYzine, labetalol, magnesium citrate, magnesium sulfate bolus IVPB, morphine injection, ondansetron, oxyCODONE, phenol, polyethylene glycol, traMADol  DVT prophylaxis: SCD's Start: 12/10/22 1731    Code Status: Full Code  Disposition Plan:  Level of care: Med-Surg Status is: Inpatient Remains inpatient appropriate because: needs placement    Consultants:  ortho   Objective: Vitals:   01/19/23 0727 01/19/23 1452 01/19/23 2220 01/20/23 0741  BP: (!) 123/107 (!) 116/31 (!) 139/94 (!) 139/93  Pulse: 85 82 79 86  Resp:   18   Temp: (!) 97.5 F (36.4 C) 97.7 F (36.5 C) 97.6 F (36.4 C) 97.9 F (36.6 C)  TempSrc: Oral Oral  Oral  SpO2: 98% 99% 99% 100%  Weight:      Height:        Intake/Output Summary (Last 24 hours) at 01/20/2023 0909 Last data filed at 01/20/2023 [id number] Gross per 24 hour  Intake 120 ml  Output 1050 ml  Net -930 ml     Filed Weights   01/05/23 0539 01/11/23 0500 01/12/23 0500  Weight: 82 kg 82.5 kg 82 kg    Examination:  Constitutional: NAD Respiratory: CTA Cardiovascular: RRR   Data Reviewed: I have personally reviewed following labs and imaging studies  CBC: Recent Labs  Lab 01/14/23 1534  WBC 6.0  HGB 12.8*  HCT 38.7*  MCV 90.8  PLT 254     Basic Metabolic Panel: Recent Labs  Lab 01/14/23 1534  NA 139  K 4.0  CL 105  CO2  25  GLUCOSE 122*  BUN 17  CREATININE 0.98  CALCIUM 8.9     GFR: Estimated Creatinine Clearance: 86.2 mL/min (by C-G formula based on SCr of 0.98 mg/dL). Liver Function Tests: No results for input(s): "AST", "ALT", "ALKPHOS", "BILITOT", "PROT", "ALBUMIN" in the last 168 hours. No results for input(s): "LIPASE", "AMYLASE" in the last 168 hours. No results for input(s): "AMMONIA" in the last 168 hours. Coagulation Profile: No results for input(s): "INR", "PROTIME" in the last 168 hours. Cardiac Enzymes: No  results for input(s): "CKTOTAL", "CKMB", "CKMBINDEX", "TROPONINI" in the last 168 hours. BNP (last 3 results) No results for input(s): "PROBNP" in the last 8760 hours. HbA1C: No results for input(s): "HGBA1C" in the last 72 hours. CBG: No results for input(s): "GLUCAP" in the last 168 hours.  Lipid Profile: No results for input(s): "CHOL", "HDL", "LDLCALC", "TRIG", "CHOLHDL", "LDLDIRECT" in the last 72 hours. Thyroid Function Tests: No results for input(s): "TSH", "T4TOTAL", "FREET4", "T3FREE", "THYROIDAB" in the last 72 hours. Anemia Panel: No results for input(s): "VITAMINB12", "FOLATE", "FERRITIN", "TIBC", "IRON", "RETICCTPCT" in the last 72 hours. Sepsis Labs: No results for input(s): "PROCALCITON", "LATICACIDVEN" in the last 168 hours.  No results found for this or any previous visit (from the past 240 hour(s)).   Radiology Studies: No results found.  Scheduled Meds:  amLODipine  10 mg Oral Daily   vitamin C  1,000 mg Oral Daily   collagenase   Topical Daily   docusate sodium  100 mg Oral Daily   folic acid  1 mg Oral Daily   furosemide  40 mg Oral Daily   gabapentin  900 mg Oral TID   isosorbide mononitrate  15 mg Oral Daily   melatonin  3 mg Oral QHS   multivitamin with minerals  1 tablet Oral Daily   pantoprazole  40 mg Oral Daily   thiamine  100 mg Oral Daily   Continuous Infusions:  magnesium sulfate bolus IVPB       LOS: 44 days  [name] M. Cruzita Lederer, MD, PhD [facility]  Between 7 am - 7 pm you can contact me via Amion (for emergencies) or [place] (non urgent matters).  I am not available 7 pm - 7 am, please contact night coverage MD/APP via Amion

## 2023-01-21 DIAGNOSIS — I96 Gangrene, not elsewhere classified: Secondary | ICD-10-CM | POA: Diagnosis not present

## 2023-01-21 NOTE — Progress Notes (Signed)
Physical Therapy Treatment Patient Details Name: [name] MRN: [id number] DOB: [birth year] Today's Date: 01/21/2023   History of Present Illness Pt is a 57 y/o M who presented 12/17/22 with L ankle cellulitis and underwent L BKA on 1/10. PMH includes asthma, bioplar 1, depression, schizoaffective disorder, CVA, chronic CHF, homelessness, HTN, DM2, cocaine and cannabis use, and R AKA.    PT Comments    Continuing work on functional mobility and activity tolerance;  session focused on getting to know pt and his goals, and functional transfers; Performed lateral scoot transfer wc to mat table at lowest setting, then mat table back to wheelchair; [name] asked why this PT asked him to perform serial transfers, and why to this specific surface; Explained that to master a skill requires repetition, and to be able to perform the skill in a variety of circumstances; Added that we will work on transfers to/from couch in room, mat table at differing heights, benches, seating surfaces indoors and outdoors as well; I anticipate needing to continue discussions about mastering transfer training; Still, he is off to an excellent start;   Vihan is very nervous about his wheelchair tipping (I wonder if that has happened to him in the past); this PT provided closeguard to steady wc if it begins to tip, and with today's transfers, the wc didn't tip; Chalen will need lots of practice to build his confidence;   Posted list of exercises in pt's room; The posting is more for pt's caregivers than for Sher himself; He has a visual impairment, and he can't read them  from his bed; Posted exercises as a cue for friends and staff to encoureage Yhair to perform the exercises   Recommendations for follow up therapy are one component of a multi-disciplinary discharge planning process, led by the attending physician.  Recommendations may be updated based on patient status, additional functional criteria and insurance  authorization.  Follow Up Recommendations  Skilled nursing-short term rehab (<3 hours/day) (Pursuing drug addiction rehab, must be modified independent) Can patient physically be transported by private vehicle:  (Yes, but would need assist with lateral scooting wc <>car)   Assistance Recommended at Discharge Intermittent Supervision/Assistance  Patient can return home with the following A little help with walking and/or transfers;A little help with bathing/dressing/bathroom;Assistance with cooking/housework;Assist for transportation;Help with stairs or ramp for entrance   Equipment Recommendations  Other (comment) (Recommending L elevating leg rest for wheelchair but pt refusing it)    Recommendations for Other Services       Precautions / Restrictions Precautions Precautions: Fall Precaution Comments: R AKA, L BKA     Mobility  Bed Mobility                    Transfers Overall transfer level: Needs assistance Equipment used: None Transfers: Bed to chair/wheelchair/BSC            Lateral/Scoot Transfers: Min guard General transfer comment: Transferred wc to mat table to the Right, then mat table back to wc on his L; closeguard to steady wc and ultimately pt did not need physical assist; We discussed the need for practice to build strength, skill, and confidence    Ambulation/Gait                   Geneticist, molecular Details (indicate cue type and reason): Spends lots of time in the wc throughout the day  Modified Rankin (Stroke Patients Only)       Balance     Sitting balance-Leahy Scale: Good Sitting balance - Comments: on mat table                                    Cognition Arousal/Alertness: Awake/alert Behavior During Therapy: WFL for tasks assessed/performed Overall Cognitive Status: No family/caregiver present to determine baseline cognitive  functioning                                 General Comments: Noting occasionally distracted; able to self-monitor enough to recognize when distracted and as PT to repeat as needed        Exercises      General Comments General comments (skin integrity, edema, etc.): Posted list of exercises in pt's room; The posting is more for pt's caregivers tahn for Marton himself; Pt with visual impairment, and he can't read them  from his bed; Posted exercises as a cue for friends and staff to [employer] to perform the exercises      Pertinent Vitals/Pain Pain Assessment Pain Assessment: Faces Faces Pain Scale: Hurts a little bit Pain Location: left residual limb, and reports knee discomfort when in porne position Pain Descriptors / Indicators: Discomfort Pain Intervention(s): Monitored during session    Home Living                          Prior Function            PT Goals (current goals can now be found in the care plan section) Acute Rehab PT Goals Patient Stated Goal: Needs to be able to transfer independently to "get ready to go to [facility]" PT Goal Formulation: With patient Time For Goal Achievement: 01/26/23 Potential to Achieve Goals: Good Progress towards PT goals: Progressing toward goals    Frequency    Min 1X/week      PT Plan Current plan remains appropriate    Co-evaluation              AM-PAC PT "6 Clicks" Mobility   Outcome Measure  Help needed turning from your back to your side while in a flat bed without using bedrails?: None Help needed moving from lying on your back to sitting on the side of a flat bed without using bedrails?: None Help needed moving to and from a bed to a chair (including a wheelchair)?: None Help needed standing up from a chair using your arms (e.g., wheelchair or bedside chair)?: Total Help needed to walk in hospital room?: Total Help needed climbing 3-5 steps with a railing? : Total 6 Click  Score: 15    End of Session   Activity Tolerance: Patient tolerated treatment well Patient left: Other (comment) (in wc, visiting with friend) Nurse Communication: Mobility status PT Visit Diagnosis: Muscle weakness (generalized) (M62.81);Difficulty in walking, not elsewhere classified (R26.2);Other abnormalities of gait and mobility (R26.89)     Time: [id number] PT Time Calculation (min) (ACUTE ONLY): 33 min  Charges:  $Therapeutic Activity: 23-37 mins                     Roney Marion, PT  Acute Rehabilitation Services Office [phone]    [place] 01/21/2023, 5:58 PM

## 2023-01-21 NOTE — Progress Notes (Signed)
PROGRESS NOTE    [name]  [id number] DOB: 11/07/1966 DOA: 12/07/2022 PCP: Marliss Coots, NP    Brief Narrative:  57 year old male with past medical history of chronic combined CHF, CAD, hyperlipidemia, polyneuropathy, bipolar disorder, COPD, homelessness presented with lower extremity chronic venous stasis wound with concern for superimposed infection. He was seen by orthopedic surgery and was recommended for ongoing left BKA. 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 Currently awaiting skilled nursing facility placement.    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 Sharol Given.  He has completed 2 weeks of doxycycline with a stop date 2/9 -PT consulted, recommended skilled nursing facility; [facility] consulted but having issues with placement -Pain control with Neurontin, oxycodone alternating with morphine   Hypertension -Continue Imdur, amlodipine, Lasix.  Blood pressure acceptable   Chronic combined CHF -Appears well compensated, continue Lasix 40 mg p.o. daily.  Has been intermittently refusing labs   COPD -Stable   Anxiety -Continue Atarax as needed   GERD -Continue PPI   Constipation -bowel regimen   Scheduled Meds:  amLODipine  10 mg Oral Daily   vitamin C  1,000 mg Oral Daily   collagenase   Topical Daily   docusate sodium  100 mg Oral Daily   folic acid  1 mg Oral Daily   furosemide  40 mg Oral Daily   gabapentin  900 mg Oral TID   isosorbide mononitrate  15 mg Oral Daily   melatonin  3 mg Oral QHS   multivitamin with minerals  1 tablet Oral Daily   pantoprazole  40 mg Oral Daily   thiamine  100 mg Oral Daily   Continuous Infusions:  magnesium sulfate bolus IVPB     PRN Meds:.albuterol, alum & mag hydroxide-simeth, bisacodyl, diphenhydrAMINE, guaiFENesin-dextromethorphan,  hydrALAZINE, hydrOXYzine, labetalol, magnesium citrate, magnesium sulfate bolus IVPB, morphine injection, ondansetron, oxyCODONE, phenol, polyethylene glycol, traMADol  DVT prophylaxis: SCD's Start: 12/10/22 1731    Code Status: Full Code  Disposition Plan:  Level of care: Med-Surg Status is: Inpatient Remains inpatient appropriate because: needs placement    Consultants:  ortho   Objective: Vitals:   01/20/23 0741 01/20/23 1510 01/20/23 2229 01/21/23 0726  BP: (!) 139/93 (!) 122/98 136/75 (!) 143/93  Pulse: 86 65 84 85  Resp:   18   Temp: 97.9 F (36.6 C) 97.7 F (36.5 C) 98.7 F (37.1 C) 97.7 F (36.5 C)  TempSrc: Oral Oral Oral Oral  SpO2: 100% 99% 100% 99%  Weight:      Height:        Intake/Output Summary (Last 24 hours) at 01/21/2023 1023 Last data filed at 01/21/2023 0727 Gross per 24 hour  Intake 120 ml  Output 1400 ml  Net -1280 ml    Filed Weights   01/05/23 0539 01/11/23 0500 01/12/23 0500  Weight: 82 kg 82.5 kg 82 kg    Examination:  In wheelchair going down hallway   Data Reviewed: I have personally reviewed following labs and imaging studies  CBC: Recent Labs  Lab 01/14/23 1534  WBC 6.0  HGB 12.8*  HCT 38.7*  MCV 90.8  PLT 0000000    Basic Metabolic Panel: Recent Labs  Lab 01/14/23 1534  NA 139  K 4.0  CL 105  CO2 25  GLUCOSE 122*  BUN 17  CREATININE 0.98  CALCIUM 8.9  GFR: Estimated Creatinine Clearance: 86.2 mL/min (by C-G formula based on SCr of 0.98 mg/dL). Liver Function Tests: No results for input(s): "AST", "ALT", "ALKPHOS", "BILITOT", "PROT", "ALBUMIN" in the last 168 hours. No results for input(s): "LIPASE", "AMYLASE" in the last 168 hours. No results for input(s): "AMMONIA" in the last 168 hours. Coagulation Profile: No results for input(s): "INR", "PROTIME" in the last 168 hours. Cardiac Enzymes: No results for input(s): "CKTOTAL", "CKMB", "CKMBINDEX", "TROPONINI" in the last 168 hours. BNP (last 3  results) No results for input(s): "PROBNP" in the last 8760 hours. HbA1C: No results for input(s): "HGBA1C" in the last 72 hours. CBG: No results for input(s): "GLUCAP" in the last 168 hours.  Lipid Profile: No results for input(s): "CHOL", "HDL", "LDLCALC", "TRIG", "CHOLHDL", "LDLDIRECT" in the last 72 hours. Thyroid Function Tests: No results for input(s): "TSH", "T4TOTAL", "FREET4", "T3FREE", "THYROIDAB" in the last 72 hours. Anemia Panel: No results for input(s): "VITAMINB12", "FOLATE", "FERRITIN", "TIBC", "IRON", "RETICCTPCT" in the last 72 hours. Sepsis Labs: No results for input(s): "PROCALCITON", "LATICACIDVEN" in the last 168 hours.  No results found for this or any previous visit (from the past 240 hour(s)).   Radiology Studies: No results found.  Scheduled Meds:  amLODipine  10 mg Oral Daily   vitamin C  1,000 mg Oral Daily   collagenase   Topical Daily   docusate sodium  100 mg Oral Daily   folic acid  1 mg Oral Daily   furosemide  40 mg Oral Daily   gabapentin  900 mg Oral TID   isosorbide mononitrate  15 mg Oral Daily   melatonin  3 mg Oral QHS   multivitamin with minerals  1 tablet Oral Daily   pantoprazole  40 mg Oral Daily   thiamine  100 mg Oral Daily   Continuous Infusions:  magnesium sulfate bolus IVPB       LOS: 45 days  Teah Votaw DO  Between 7 am - 7 pm you can contact me via Amion (for emergencies) or Securechat (non urgent matters).  I am not available 7 pm - 7 am, please contact night coverage MD/APP via Amion

## 2023-01-21 NOTE — Plan of Care (Signed)

## 2023-01-22 DIAGNOSIS — I96 Gangrene, not elsewhere classified: Secondary | ICD-10-CM | POA: Diagnosis not present

## 2023-01-22 NOTE — Progress Notes (Signed)
PROGRESS NOTE    [name]  [id number] DOB: [birth year] DOA: 12/07/2022 PCP: [name] Coots, NP    Brief Narrative:  57 year old male with past medical history of chronic combined CHF, CAD, hyperlipidemia, polyneuropathy, bipolar disorder, COPD, homelessness presented with lower extremity chronic venous stasis wound with concern for superimposed infection. He was seen by orthopedic surgery and was recommended for ongoing left BKA. 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 Currently awaiting skilled nursing facility placement.    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  He has completed 2 weeks of doxycycline with a stop date 2/9 -PT consulted, recommended skilled nursing facility; [facility] consulted but having issues with placement -Pain control with Neurontin, oxycodone alternating with morphine   Hypertension -Continue Imdur, amlodipine, Lasix.  Blood pressure acceptable   Chronic combined CHF -Appears well compensated, continue Lasix 40 mg p.o. daily.  Has been intermittently refusing labs   COPD -Stable   Anxiety -Continue Atarax as needed   GERD -Continue PPI   Constipation -bowel regimen   Scheduled Meds:  amLODipine  10 mg Oral Daily   vitamin C  1,000 mg Oral Daily   collagenase   Topical Daily   docusate sodium  100 mg Oral Daily   folic acid  1 mg Oral Daily   furosemide  40 mg Oral Daily   gabapentin  900 mg Oral TID   isosorbide mononitrate  15 mg Oral Daily   melatonin  3 mg Oral QHS   multivitamin with minerals  1 tablet Oral Daily   pantoprazole  40 mg Oral Daily   thiamine  100 mg Oral Daily   Continuous Infusions:  magnesium sulfate bolus IVPB     PRN Meds:.albuterol, alum & mag hydroxide-simeth, bisacodyl, diphenhydrAMINE, guaiFENesin-dextromethorphan,  hydrALAZINE, hydrOXYzine, labetalol, magnesium citrate, magnesium sulfate bolus IVPB, morphine injection, ondansetron, oxyCODONE, phenol, polyethylene glycol, traMADol  DVT prophylaxis: SCD's Start: 12/10/22 1731    Code Status: Full Code  Disposition Plan:  Level of care: Med-Surg Status is: Inpatient Remains inpatient appropriate because: needs placement    Consultants:  ortho   Objective: Vitals:   01/21/23 2000 01/22/23 0500 01/22/23 0648 01/22/23 0700  BP: (!) 121/107  138/88 114/73  Pulse: 80  85 84  Resp: 16  16 17  $ Temp: 98.6 F (37 C)  97.6 F (36.4 C) 97.7 F (36.5 C)  TempSrc: Oral  Oral Oral  SpO2:   97% 99%  Weight:  86.4 kg    Height:        Intake/Output Summary (Last 24 hours) at 01/22/2023 1026 Last data filed at 01/21/2023 2110 Gross per 24 hour  Intake 237 ml  Output 250 ml  Net -13 ml    Filed Weights   01/11/23 0500 01/12/23 0500 01/22/23 0500  Weight: 82.5 kg 82 kg 86.4 kg    Examination:  NAD, in bed, laying flat   Data Reviewed: I have personally reviewed following labs and imaging studies  CBC: No results for input(s): "WBC", "NEUTROABS", "HGB", "HCT", "MCV", "PLT" in the last 168 hours.   Basic Metabolic Panel: No results for input(s): "NA", "K", "CL", "CO2", "GLUCOSE", "BUN", "CREATININE", "CALCIUM", "MG", "PHOS" in the last 168 hours.   GFR: Estimated Creatinine Clearance: 93.8 mL/min (by C-G formula based on SCr of 0.98 mg/dL). Liver Function Tests: No results for input(s): "  AST", "ALT", "ALKPHOS", "BILITOT", "PROT", "ALBUMIN" in the last 168 hours. No results for input(s): "LIPASE", "AMYLASE" in the last 168 hours. No results for input(s): "AMMONIA" in the last 168 hours. Coagulation Profile: No results for input(s): "INR", "PROTIME" in the last 168 hours. Cardiac Enzymes: No results for input(s): "CKTOTAL", "CKMB", "CKMBINDEX", "TROPONINI" in the last 168 hours. BNP (last 3 results) No results for input(s): "PROBNP"  in the last 8760 hours. HbA1C: No results for input(s): "HGBA1C" in the last 72 hours. CBG: No results for input(s): "GLUCAP" in the last 168 hours.  Lipid Profile: No results for input(s): "CHOL", "HDL", "LDLCALC", "TRIG", "CHOLHDL", "LDLDIRECT" in the last 72 hours. Thyroid Function Tests: No results for input(s): "TSH", "T4TOTAL", "FREET4", "T3FREE", "THYROIDAB" in the last 72 hours. Anemia Panel: No results for input(s): "VITAMINB12", "FOLATE", "FERRITIN", "TIBC", "IRON", "RETICCTPCT" in the last 72 hours. Sepsis Labs: No results for input(s): "PROCALCITON", "LATICACIDVEN" in the last 168 hours.  No results found for this or any previous visit (from the past 240 hour(s)).   Radiology Studies: No results found.  Scheduled Meds:  amLODipine  10 mg Oral Daily   vitamin C  1,000 mg Oral Daily   collagenase   Topical Daily   docusate sodium  100 mg Oral Daily   folic acid  1 mg Oral Daily   furosemide  40 mg Oral Daily   gabapentin  900 mg Oral TID   isosorbide mononitrate  15 mg Oral Daily   melatonin  3 mg Oral QHS   multivitamin with minerals  1 tablet Oral Daily   pantoprazole  40 mg Oral Daily   thiamine  100 mg Oral Daily   Continuous Infusions:  magnesium sulfate bolus IVPB       LOS: 46 days  [name] Upperman DO  Between 7 am - 7 pm you can contact me via Amion (for emergencies) or Securechat (non urgent matters).  I am not available 7 pm - 7 am, please contact night coverage MD/APP via Amion

## 2023-01-22 NOTE — Progress Notes (Signed)
Occupational Therapy Treatment Patient Details Name: [name] MRN: [id number] DOB: 03-15-1966 Today's Date: 01/22/2023   History of present illness Pt is a 57 y/o M who presented 12/17/22 with L ankle cellulitis and underwent L BKA on 1/10. PMH includes asthma, bioplar 1, depression, schizoaffective disorder, CVA, chronic CHF, homelessness, HTN, DM2, cocaine and cannabis use, and R AKA.   OT comments  Pt was able to don athletic shorts while seated in w/c at Mod I level. Declined to leave them on during therapy session. Pt reports that he took a shower yesterday with nurse tech and he did well. He was able to do all the bathing without needing physical assistance. Pt with questions regarding next possible venue and how the bathroom would be set up and was concerned that he wouldn't be able to complete toilet transfers in the AP t/f fashion that he has. Moving forward, it would be beneficial to wear shorts during therapy session; boxer briefs possibly too; and work on lateral transfers to the toilet with a [place] and also with a regular toilet, a tub/shower transfer in addition to a walk-in shower with use of a tub bench for both due to bilateral amputations.If recovery center accepts him, we can then find out exactly how the bathroom is set up in order to know how he needs to transfer to the toilet and shower, etc. Will also need to find out if bathroom is handicap accessible and if DME will  need to ordered prior to admission.    Recommendations for follow up therapy are one component of a multi-disciplinary discharge planning process, led by the attending physician.  Recommendations may be updated based on patient status, additional functional criteria and insurance authorization.    Follow Up Recommendations  Skilled nursing-short term rehab (<3 hours/day)     Assistance Recommended at Discharge PRN  Patient can return home with the following  A little help with  bathing/dressing/bathroom;Help with stairs or ramp for entrance;Assist for transportation;A little help with walking and/or transfers   Equipment Recommendations  Other (comment) (TBD based on discharge venue)       Precautions / Restrictions Precautions Precautions: Fall Precaution Comments: R AKA, L BKA Restrictions Weight Bearing Restrictions: Yes RLE Weight Bearing: Non weight bearing LLE Weight Bearing: Non weight bearing          Balance Overall balance assessment: Needs assistance Sitting-balance support: Feet unsupported, Single extremity supported Sitting balance-Leahy Scale: Good Sitting balance - Comments: seated in w/c, on regular height toilet       ADL either performed or assessed with clinical judgement   ADL     Lower Body Dressing: Modified independent;Sitting/lateral leans Lower Body Dressing Details (indicate cue type and reason): seated in personal wheelchair. Donned/doffed athletic Hydrologist Details (indicate cue type and reason): AP transfer to/from toilet and personal w/c                  Cognition Arousal/Alertness: Awake/alert Behavior During Therapy: Agitated Overall Cognitive Status: No family/caregiver present to determine baseline cognitive functioning Area of Impairment: Safety/judgement, Awareness, Problem solving     Safety/Judgement: Decreased awareness of deficits   Problem Solving: Slow processing General Comments: self limiting during session. Perseverating on his frustration which impeded the ability to receive education provided by therapist as the benefit of wearing shorts/LB clothing when working with therapy.                   Pertinent  Vitals/ Pain       Pain Assessment Pain Assessment: Faces Faces Pain Scale: No hurt         Frequency  Min 1X/week        Progress Toward Goals  OT Goals(current goals can now be found in the care plan  section)  Progress towards OT goals: Progressing toward goals  Acute Rehab OT Goals Time For Goal Achievement: 02/05/23 Potential to Achieve Goals: Good  Plan Discharge plan remains appropriate;Frequency remains appropriate       AM-PAC OT "6 Clicks" Daily Activity     Outcome Measure   Help from another person eating meals?: None Help from another person taking care of personal grooming?: None Help from another person toileting, which includes using toliet, bedpan, or urinal?: None Help from another person bathing (including washing, rinsing, drying)?: A Little Help from another person to put on and taking off regular upper body clothing?: None Help from another person to put on and taking off regular lower body clothing?: None 6 Click Score: 23    End of Session Equipment Utilized During Treatment: Other (comment) (wheelchair)  OT Visit Diagnosis: Muscle weakness (generalized) (M62.81);Pain Pain - Right/Left: Left Pain - part of body: Leg   Activity Tolerance Patient tolerated treatment well   Patient Left in chair;with call bell/phone within reach;Other (comment) (in personal wheelchair)           Time: [id number] OT Time Calculation (min): 24 min  Charges: OT General Charges $OT Visit: 1 Visit OT Treatments $Self Care/Home Management : 8-22 mins $Therapeutic Activity: 8-22 mins  Ailene Ravel, OTR/L,CBIS  Supplemental OT - [facility] and [facility] Secure Chat Preferred    Sussan Meter, [name] 01/22/2023, 6:02 PM

## 2023-01-22 NOTE — Progress Notes (Signed)
CSW spoke with representative at [facility] to determine if a decision has made on patient's referral - clinicals still under review.  Madilyn Fireman, MSW, [name] Transitions of Care  Clinical Social Worker II [phone]

## 2023-01-22 NOTE — Plan of Care (Signed)
  Problem: Health Behavior/Discharge Planning: Goal: Ability to manage health-related needs will improve Outcome: Progressing   Problem: Coping: Goal: Level of anxiety will decrease Outcome: Progressing   Problem: Education: Goal: Knowledge of General Education information will improve Description: Including pain rating scale, medication(s)/side effects and non-pharmacologic comfort measures Outcome: Progressing   Problem: Self-Concept: Goal: Ability to maintain and perform role responsibilities to the fullest extent possible will improve Outcome: Progressing   Problem: Pain Management: Goal: Pain level will decrease with appropriate interventions Outcome: Progressing

## 2023-01-23 DIAGNOSIS — I96 Gangrene, not elsewhere classified: Secondary | ICD-10-CM | POA: Diagnosis not present

## 2023-01-23 NOTE — Progress Notes (Signed)
CSW spoke with patient at bedside to discuss discharge plans. CSW informed patient that [facility] still has not made a decision about the referral. Patient states he would like CSW to contact [facility] in [place] to determine if they can accept him.  CSW spoke with Network engineer at [facility] who states there are no beds available until at least the end of March.  Madilyn Fireman, MSW, [name] Transitions of Care  Clinical Social Worker II [phone]

## 2023-01-23 NOTE — Progress Notes (Signed)
PROGRESS NOTE    [name]  [id number] DOB: Aug 28, 1966 DOA: 12/07/2022 PCP: Marliss Coots, NP    Brief Narrative:  57 year old male with past medical history of chronic combined CHF, CAD, hyperlipidemia, polyneuropathy, bipolar disorder, COPD, homelessness presented with lower extremity chronic venous stasis wound with concern for superimposed infection. He was seen by orthopedic surgery and was recommended for ongoing left BKA. 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 Currently awaiting skilled nursing facility placement.    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 Sharol Given.  He has completed 2 weeks of doxycycline with a stop date 2/9 -PT consulted, recommended skilled nursing facility; [facility] consulted but having issues with placement -Pain control with Neurontin, oxycodone alternating with morphine   Hypertension -Continue Imdur, amlodipine, Lasix.  Blood pressure acceptable   Chronic combined CHF -Appears well compensated, continue Lasix 40 mg p.o. daily.  Has been intermittently refusing labs   COPD -Stable   Anxiety -Continue Atarax as needed   GERD -Continue PPI   Constipation -bowel regimen   Scheduled Meds:  amLODipine  10 mg Oral Daily   vitamin C  1,000 mg Oral Daily   collagenase   Topical Daily   docusate sodium  100 mg Oral Daily   folic acid  1 mg Oral Daily   furosemide  40 mg Oral Daily   gabapentin  900 mg Oral TID   isosorbide mononitrate  15 mg Oral Daily   melatonin  3 mg Oral QHS   multivitamin with minerals  1 tablet Oral Daily   pantoprazole  40 mg Oral Daily   thiamine  100 mg Oral Daily   Continuous Infusions:  magnesium sulfate bolus IVPB     PRN Meds:.albuterol, alum & mag hydroxide-simeth, bisacodyl, diphenhydrAMINE, guaiFENesin-dextromethorphan,  hydrALAZINE, hydrOXYzine, labetalol, magnesium citrate, magnesium sulfate bolus IVPB, morphine injection, ondansetron, oxyCODONE, phenol, polyethylene glycol, traMADol  DVT prophylaxis: SCD's Start: 12/10/22 1731    Code Status: Full Code  Disposition Plan:  Level of care: Med-Surg Status is: Inpatient Remains inpatient appropriate because: needs placement    Consultants:  ortho   Objective: Vitals:   01/22/23 0700 01/22/23 1421 01/23/23 0500 01/23/23 0730  BP: 114/73 121/70  (!) 139/99  Pulse: 84 85  82  Resp: '17 16  18  '$ Temp: 97.7 F (36.5 C) 97.9 F (36.6 C)  98.7 F (37.1 C)  TempSrc: Oral Oral  Oral  SpO2: 99% 100%  97%  Weight:   85.2 kg   Height:        Intake/Output Summary (Last 24 hours) at 01/23/2023 1024 Last data filed at 01/23/2023 0749 Gross per 24 hour  Intake 597 ml  Output 1350 ml  Net -753 ml    Filed Weights   01/12/23 0500 01/22/23 0500 01/23/23 0500  Weight: 82 kg 86.4 kg 85.2 kg    Examination:  In bed, NAD   Data Reviewed: I have personally reviewed following labs and imaging studies  CBC: No results for input(s): "WBC", "NEUTROABS", "HGB", "HCT", "MCV", "PLT" in the last 168 hours.   Basic Metabolic Panel: No results for input(s): "NA", "K", "CL", "CO2", "GLUCOSE", "BUN", "CREATININE", "CALCIUM", "MG", "PHOS" in the last 168 hours.   GFR: Estimated Creatinine Clearance: 93.2 mL/min (by C-G formula based on SCr of 0.98 mg/dL). Liver Function Tests: No results for input(s): "AST", "ALT", "ALKPHOS", "BILITOT", "  PROT", "ALBUMIN" in the last 168 hours. No results for input(s): "LIPASE", "AMYLASE" in the last 168 hours. No results for input(s): "AMMONIA" in the last 168 hours. Coagulation Profile: No results for input(s): "INR", "PROTIME" in the last 168 hours. Cardiac Enzymes: No results for input(s): "CKTOTAL", "CKMB", "CKMBINDEX", "TROPONINI" in the last 168 hours. BNP (last 3 results) No results for input(s): "PROBNP" in the  last 8760 hours. HbA1C: No results for input(s): "HGBA1C" in the last 72 hours. CBG: No results for input(s): "GLUCAP" in the last 168 hours.  Lipid Profile: No results for input(s): "CHOL", "HDL", "LDLCALC", "TRIG", "CHOLHDL", "LDLDIRECT" in the last 72 hours. Thyroid Function Tests: No results for input(s): "TSH", "T4TOTAL", "FREET4", "T3FREE", "THYROIDAB" in the last 72 hours. Anemia Panel: No results for input(s): "VITAMINB12", "FOLATE", "FERRITIN", "TIBC", "IRON", "RETICCTPCT" in the last 72 hours. Sepsis Labs: No results for input(s): "PROCALCITON", "LATICACIDVEN" in the last 168 hours.  No results found for this or any previous visit (from the past 240 hour(s)).   Radiology Studies: No results found.  Scheduled Meds:  amLODipine  10 mg Oral Daily   vitamin C  1,000 mg Oral Daily   collagenase   Topical Daily   docusate sodium  100 mg Oral Daily   folic acid  1 mg Oral Daily   furosemide  40 mg Oral Daily   gabapentin  900 mg Oral TID   isosorbide mononitrate  15 mg Oral Daily   melatonin  3 mg Oral QHS   multivitamin with minerals  1 tablet Oral Daily   pantoprazole  40 mg Oral Daily   thiamine  100 mg Oral Daily   Continuous Infusions:  magnesium sulfate bolus IVPB       LOS: 47 days  Zada Haser DO  Between 7 am - 7 pm you can contact me via Amion (for emergencies) or Securechat (non urgent matters).  I am not available 7 pm - 7 am, please contact night coverage MD/APP via Amion

## 2023-01-24 DIAGNOSIS — I96 Gangrene, not elsewhere classified: Secondary | ICD-10-CM | POA: Diagnosis not present

## 2023-01-24 NOTE — Plan of Care (Signed)
  Problem: Education: Goal: Knowledge of General Education information will improve Description: Including pain rating scale, medication(s)/side effects and non-pharmacologic comfort measures Outcome: Progressing   Problem: Clinical Measurements: Goal: Diagnostic test results will improve Outcome: Progressing   Problem: Education: Goal: Knowledge of General Education information will improve Description: Including pain rating scale, medication(s)/side effects and non-pharmacologic comfort measures Outcome: Progressing   Problem: Pain Managment: Goal: General experience of comfort will improve Outcome: Progressing

## 2023-01-24 NOTE — Progress Notes (Signed)
Pt is requesting another Therapist, sports. CN is notified. Report is given to Ander Purpura, RN.

## 2023-01-24 NOTE — Plan of Care (Signed)
°  Problem: Fluid Volume: °Goal: Hemodynamic stability will improve °Outcome: Progressing °  °Problem: Clinical Measurements: °Goal: Diagnostic test results will improve °Outcome: Progressing °Goal: Signs and symptoms of infection will decrease °Outcome: Progressing °  °

## 2023-01-24 NOTE — Progress Notes (Signed)
PROGRESS NOTE    [name]  [id number] DOB: Oct 11, 1966 DOA: 12/07/2022 PCP: Marliss Coots, NP    Brief Narrative:  57 year old male with past medical history of chronic combined CHF, CAD, hyperlipidemia, polyneuropathy, bipolar disorder, COPD, homelessness presented with lower extremity chronic venous stasis wound with concern for superimposed infection. He was seen by orthopedic surgery and was recommended for ongoing left BKA. 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 Currently awaiting skilled nursing facility placement.    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 Sharol Given.  He has completed 2 weeks of doxycycline with a stop date 2/9 -PT consulted, recommended skilled nursing facility; [facility] consulted but having issues with placement -Pain control with Neurontin, oxycodone alternating with morphine   Hypertension -Continue Imdur, amlodipine, Lasix.  Blood pressure acceptable   Chronic combined CHF -Appears well compensated, continue Lasix 40 mg p.o. daily.  Has been intermittently refusing labs   COPD -Stable   Anxiety -Continue Atarax as needed   GERD -Continue PPI   Constipation -bowel regimen   Scheduled Meds:  amLODipine  10 mg Oral Daily   vitamin C  1,000 mg Oral Daily   collagenase   Topical Daily   docusate sodium  100 mg Oral Daily   folic acid  1 mg Oral Daily   furosemide  40 mg Oral Daily   gabapentin  900 mg Oral TID   isosorbide mononitrate  15 mg Oral Daily   melatonin  3 mg Oral QHS   multivitamin with minerals  1 tablet Oral Daily   pantoprazole  40 mg Oral Daily   thiamine  100 mg Oral Daily   Continuous Infusions:  magnesium sulfate bolus IVPB     PRN Meds:.albuterol, alum & mag hydroxide-simeth, bisacodyl, diphenhydrAMINE,  guaiFENesin-dextromethorphan, hydrALAZINE, hydrOXYzine, labetalol, magnesium citrate, magnesium sulfate bolus IVPB, morphine injection, ondansetron, oxyCODONE, phenol, polyethylene glycol, traMADol  DVT prophylaxis: SCD's Start: 12/10/22 1731    Code Status: Full Code  Disposition Plan:  Level of care: Med-Surg Status is: Inpatient Remains inpatient appropriate because: needs placement    Consultants:  ortho   Objective: Vitals:   01/23/23 1926 01/24/23 0334 01/24/23 0500 01/24/23 0805  BP: 119/74 (!) 132/94  (!) 114/97  Pulse: 77 82  60  Resp: '19 18  19  '$ Temp: 97.6 F (36.4 C) 98.4 F (36.9 C)  98.6 F (37 C)  TempSrc:    Oral  SpO2: 95% 98%  96%  Weight:   85.1 kg   Height:        Intake/Output Summary (Last 24 hours) at 01/24/2023 1041 Last data filed at 01/24/2023 0400 Gross per 24 hour  Intake 357 ml  Output 550 ml  Net -193 ml    Filed Weights   01/22/23 0500 01/23/23 0500 01/24/23 0500  Weight: 86.4 kg 85.2 kg 85.1 kg    Examination:  In chair, NAD- visitor at bedside   Data Reviewed: I have personally reviewed following labs and imaging studies  CBC: No results for input(s): "WBC", "NEUTROABS", "HGB", "HCT", "MCV", "PLT" in the last 168 hours.   Basic Metabolic Panel: No results for input(s): "NA", "K", "CL", "CO2", "GLUCOSE", "BUN", "CREATININE", "CALCIUM", "MG", "PHOS" in the last 168 hours.   GFR: Estimated Creatinine Clearance: 93.2 mL/min (by C-G formula based on SCr of 0.98 mg/dL). Liver Function Tests: No results  for input(s): "AST", "ALT", "ALKPHOS", "BILITOT", "PROT", "ALBUMIN" in the last 168 hours. No results for input(s): "LIPASE", "AMYLASE" in the last 168 hours. No results for input(s): "AMMONIA" in the last 168 hours. Coagulation Profile: No results for input(s): "INR", "PROTIME" in the last 168 hours. Cardiac Enzymes: No results for input(s): "CKTOTAL", "CKMB", "CKMBINDEX", "TROPONINI" in the last 168 hours. BNP (last 3  results) No results for input(s): "PROBNP" in the last 8760 hours. HbA1C: No results for input(s): "HGBA1C" in the last 72 hours. CBG: No results for input(s): "GLUCAP" in the last 168 hours.  Lipid Profile: No results for input(s): "CHOL", "HDL", "LDLCALC", "TRIG", "CHOLHDL", "LDLDIRECT" in the last 72 hours. Thyroid Function Tests: No results for input(s): "TSH", "T4TOTAL", "FREET4", "T3FREE", "THYROIDAB" in the last 72 hours. Anemia Panel: No results for input(s): "VITAMINB12", "FOLATE", "FERRITIN", "TIBC", "IRON", "RETICCTPCT" in the last 72 hours. Sepsis Labs: No results for input(s): "PROCALCITON", "LATICACIDVEN" in the last 168 hours.  No results found for this or any previous visit (from the past 240 hour(s)).   Radiology Studies: No results found.  Scheduled Meds:  amLODipine  10 mg Oral Daily   vitamin C  1,000 mg Oral Daily   collagenase   Topical Daily   docusate sodium  100 mg Oral Daily   folic acid  1 mg Oral Daily   furosemide  40 mg Oral Daily   gabapentin  900 mg Oral TID   isosorbide mononitrate  15 mg Oral Daily   melatonin  3 mg Oral QHS   multivitamin with minerals  1 tablet Oral Daily   pantoprazole  40 mg Oral Daily   thiamine  100 mg Oral Daily   Continuous Infusions:  magnesium sulfate bolus IVPB       LOS: 48 days  Klee Kolek DO  Between 7 am - 7 pm you can contact me via Amion (for emergencies) or Securechat (non urgent matters).  I am not available 7 pm - 7 am, please contact night coverage MD/APP via Amion

## 2023-01-24 NOTE — Plan of Care (Signed)

## 2023-01-25 DIAGNOSIS — I96 Gangrene, not elsewhere classified: Secondary | ICD-10-CM | POA: Diagnosis not present

## 2023-01-25 MED ORDER — BENZOCAINE 10 % MT GEL
Freq: Four times a day (QID) | OROMUCOSAL | Status: DC | PRN
  Filled 2023-01-25 (×2): qty 9

## 2023-01-25 NOTE — Progress Notes (Signed)
PROGRESS NOTE    [name]  [id number] DOB: Dec 28, 1965 DOA: 12/07/2022 PCP: Marliss Coots, NP    Brief Narrative:  57 year old male with past medical history of chronic combined CHF, CAD, hyperlipidemia, polyneuropathy, bipolar disorder, COPD, homelessness presented with lower extremity chronic venous stasis wound with concern for superimposed infection. He was seen by orthopedic surgery and was recommended for ongoing left BKA. 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 Currently awaiting skilled nursing facility placement.    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 Sharol Given.  He has completed 2 weeks of doxycycline with a stop date 2/9 -PT consulted, recommended skilled nursing facility; [facility] consulted but having issues with placement -Pain control with Neurontin, oxycodone alternating with morphine   Hypertension -Continue Imdur, amlodipine, Lasix.  Blood pressure acceptable   Chronic combined CHF -Appears well compensated, continue Lasix 40 mg p.o. daily.  Has been intermittently refusing labs   COPD -Stable   Anxiety -Continue Atarax as needed   GERD -Continue PPI   Constipation -bowel regimen   Scheduled Meds:  amLODipine  10 mg Oral Daily   vitamin C  1,000 mg Oral Daily   collagenase   Topical Daily   docusate sodium  100 mg Oral Daily   folic acid  1 mg Oral Daily   furosemide  40 mg Oral Daily   gabapentin  900 mg Oral TID   isosorbide mononitrate  15 mg Oral Daily   melatonin  3 mg Oral QHS   multivitamin with minerals  1 tablet Oral Daily   pantoprazole  40 mg Oral Daily   thiamine  100 mg Oral Daily   Continuous Infusions:  magnesium sulfate bolus IVPB     PRN Meds:.albuterol, alum & mag hydroxide-simeth, benzocaine, bisacodyl, diphenhydrAMINE, guaiFENesin-dextromethorphan,  hydrALAZINE, hydrOXYzine, labetalol, magnesium citrate, magnesium sulfate bolus IVPB, morphine injection, ondansetron, oxyCODONE, phenol, polyethylene glycol, traMADol  DVT prophylaxis: SCD's Start: 12/10/22 1731    Code Status: Full Code  Disposition Plan:  Level of care: Med-Surg Status is: Inpatient Remains inpatient appropriate because: needs placement    Consultants:  ortho   Objective: Vitals:   01/24/23 0805 01/24/23 1500 01/24/23 2022 01/25/23 1200  BP: (!) 114/97 (!) 151/105 (!) 113/93 126/70  Pulse: 60 73 82 79  Resp: '19 18 18 18  '$ Temp: 98.6 F (37 C) 98 F (36.7 C) 98 F (36.7 C) (!) 97.5 F (36.4 C)  TempSrc: Oral Oral Oral Oral  SpO2: 96% 96% 96% 97%  Weight:      Height:        Intake/Output Summary (Last 24 hours) at 01/25/2023 1246 Last data filed at 01/25/2023 0522 Gross per 24 hour  Intake 600 ml  Output 425 ml  Net 175 ml    Filed Weights   01/22/23 0500 01/23/23 0500 01/24/23 0500  Weight: 86.4 kg 85.2 kg 85.1 kg    Examination:  In chair, NAD- visitors at bedside   Data Reviewed: I have personally reviewed following labs and imaging studies  CBC: No results for input(s): "WBC", "NEUTROABS", "HGB", "HCT", "MCV", "PLT" in the last 168 hours.   Basic Metabolic Panel: No results for input(s): "NA", "K", "CL", "CO2", "GLUCOSE", "BUN", "CREATININE", "CALCIUM", "MG", "PHOS" in the last 168 hours.   GFR: Estimated Creatinine Clearance: 93.2 mL/min (by C-G formula based on SCr of 0.98 mg/dL). Liver Function  Tests: No results for input(s): "AST", "ALT", "ALKPHOS", "BILITOT", "PROT", "ALBUMIN" in the last 168 hours. No results for input(s): "LIPASE", "AMYLASE" in the last 168 hours. No results for input(s): "AMMONIA" in the last 168 hours. Coagulation Profile: No results for input(s): "INR", "PROTIME" in the last 168 hours. Cardiac Enzymes: No results for input(s): "CKTOTAL", "CKMB", "CKMBINDEX", "TROPONINI" in the last 168 hours. BNP  (last 3 results) No results for input(s): "PROBNP" in the last 8760 hours. HbA1C: No results for input(s): "HGBA1C" in the last 72 hours. CBG: No results for input(s): "GLUCAP" in the last 168 hours.  Lipid Profile: No results for input(s): "CHOL", "HDL", "LDLCALC", "TRIG", "CHOLHDL", "LDLDIRECT" in the last 72 hours. Thyroid Function Tests: No results for input(s): "TSH", "T4TOTAL", "FREET4", "T3FREE", "THYROIDAB" in the last 72 hours. Anemia Panel: No results for input(s): "VITAMINB12", "FOLATE", "FERRITIN", "TIBC", "IRON", "RETICCTPCT" in the last 72 hours. Sepsis Labs: No results for input(s): "PROCALCITON", "LATICACIDVEN" in the last 168 hours.  No results found for this or any previous visit (from the past 240 hour(s)).   Radiology Studies: No results found.  Scheduled Meds:  amLODipine  10 mg Oral Daily   vitamin C  1,000 mg Oral Daily   collagenase   Topical Daily   docusate sodium  100 mg Oral Daily   folic acid  1 mg Oral Daily   furosemide  40 mg Oral Daily   gabapentin  900 mg Oral TID   isosorbide mononitrate  15 mg Oral Daily   melatonin  3 mg Oral QHS   multivitamin with minerals  1 tablet Oral Daily   pantoprazole  40 mg Oral Daily   thiamine  100 mg Oral Daily   Continuous Infusions:  magnesium sulfate bolus IVPB       LOS: 49 days  [name] Juncaj DO  Between 7 am - 7 pm you can contact me via Amion (for emergencies) or Securechat (non urgent matters).  I am not available 7 pm - 7 am, please contact night coverage MD/APP via Amion

## 2023-01-25 NOTE — Plan of Care (Signed)
°  Problem: Fluid Volume: °Goal: Hemodynamic stability will improve °Outcome: Progressing °  °Problem: Clinical Measurements: °Goal: Diagnostic test results will improve °Outcome: Progressing °Goal: Signs and symptoms of infection will decrease °Outcome: Progressing °  °

## 2023-01-26 DIAGNOSIS — I96 Gangrene, not elsewhere classified: Secondary | ICD-10-CM | POA: Diagnosis not present

## 2023-01-26 NOTE — Progress Notes (Signed)
Occupational Therapy Treatment Patient Details Name: [name] MRN: [id number] DOB: 02/21/1966 Today's Date: 01/26/2023   History of present illness Pt is a 57 y/o M who presented 12/17/22 with L ankle cellulitis and underwent L BKA on 1/10. PMH includes asthma, bioplar 1, depression, schizoaffective disorder, CVA, chronic CHF, homelessness, HTN, DM2, cocaine and cannabis use, and R AKA.   OT comments  Pt making progress with functional goals, however pt self limiting during session. Perseverating on his frustration with when he can leave hospital, getting prosthetics and getting a new w/c which impeded the ability to receive education provided by therapist for LB selfcare compensatory techniques. Pt hyperverbose and being overly comedic vs focusing on what therapist is asking and saying to him. OT will continue to follow acutely to maximize level of function and safety   Recommendations for follow up therapy are one component of a multi-disciplinary discharge planning process, led by the attending physician.  Recommendations may be updated based on patient status, additional functional criteria and insurance authorization.    Follow Up Recommendations  Skilled nursing-short term rehab (<3 hours/day)     Assistance Recommended at Discharge PRN  Patient can return home with the following  A little help with bathing/dressing/bathroom;Help with stairs or ramp for entrance;Assist for transportation;A little help with walking and/or transfers   Equipment Recommendations  Other (comment) (TBD based upon d/c venue)    Recommendations for Other Services      Precautions / Restrictions Precautions Precautions: Fall Precaution Comments: R AKA, L BKA Restrictions Weight Bearing Restrictions: Yes RLE Weight Bearing: Non weight bearing LLE Weight Bearing: Non weight bearing       Mobility Bed Mobility               General bed mobility comments: pt OOB in w/c upon arrival     Transfers Overall transfer level: Needs assistance Equipment used: None Transfers: Bed to chair/wheelchair/BSC         Anterior-Posterior transfers: Supervision, Modified independent (Device/Increase time)         Balance Overall balance assessment: Needs assistance   Sitting balance-Leahy Scale: Good Sitting balance - Comments: seated in w/c, on regular height toilet                                   ADL either performed or assessed with clinical judgement   ADL               Lower Body Bathing: Supervison/ safety;Sitting/lateral leans Lower Body Bathing Details (indicate cue type and reason): simulated seated in w/c         Toilet Transfer: Supervision/safety;Modified Independent Toilet Transfer Details (indicate cue type and reason): AP transfer to/from toilet and personal w/c           General ADL Comments: self limiting during session. Perseverating on his frustration with when he can leave hospital, getting prosthetics and getting a new w/c which impeded the ability to receive education provided by therapist for LB selfcare compensatory techniques. Pt hyperverbose and being overly comedic vs focusing on what therapist is asking and saying to him    Extremity/Trunk Assessment Upper Extremity Assessment Upper Extremity Assessment: Overall WFL for tasks assessed   Lower Extremity Assessment Lower Extremity Assessment: Defer to PT evaluation   Cervical / Trunk Assessment Cervical / Trunk Assessment: Normal    Vision Ability to See in Adequate Light: 0 Adequate Patient Visual Report:  No change from baseline     Perception     Praxis      Cognition Arousal/Alertness: Awake/alert Behavior During Therapy:  (hyperverbose, frequent redirection for attention to tasks) Overall Cognitive Status: No family/caregiver present to determine baseline cognitive functioning                     Current Attention Level: Alternating            General Comments: self limiting during session. Perseverating on his frustration with when he can leave hospital, getting prosthetics and getting a new w/c which impeded the ability to receive education provided by therapist for LB selfcare compensatory techniques. Pt hyperverbose and being overly comedic vs focusing on what therapist is asking and saying to him        Exercises      Shoulder Instructions       General Comments      Pertinent Vitals/ Pain       Pain Assessment Pain Assessment: 0-10  Home Living                                          Prior Functioning/Environment              Frequency  Min 1X/week        Progress Toward Goals  OT Goals(current goals can now be found in the care plan section)  Progress towards OT goals: Progressing toward goals     Plan Discharge plan remains appropriate;Frequency remains appropriate    Co-evaluation                 AM-PAC OT "6 Clicks" Daily Activity     Outcome Measure   Help from another person eating meals?: None Help from another person taking care of personal grooming?: None Help from another person toileting, which includes using toliet, bedpan, or urinal?: A Little Help from another person bathing (including washing, rinsing, drying)?: A Little Help from another person to put on and taking off regular upper body clothing?: None Help from another person to put on and taking off regular lower body clothing?: None 6 Click Score: 22    End of Session Equipment Utilized During Treatment: Other (comment) (w/c)  OT Visit Diagnosis: Muscle weakness (generalized) (M62.81);Pain   Activity Tolerance Patient tolerated treatment well   Patient Left in chair;with call bell/phone within reach;Other (comment);with family/visitor present (seated in w/c)   Nurse Communication          Time: 1004-1020 OT Time Calculation (min): 16 min  Charges: OT General Charges $OT  Visit: 1 Visit OT Treatments $Self Care/Home Management : 8-22 mins    Britt Bottom 01/26/2023, 1:44 PM

## 2023-01-26 NOTE — Progress Notes (Signed)
PROGRESS NOTE    [name]  [id number] DOB: [birth year] DOA: 12/07/2022 PCP: Marliss Coots, NP    Brief Narrative:  57 year old male with past medical history of chronic combined CHF, CAD, hyperlipidemia, polyneuropathy, bipolar disorder, COPD, homelessness presented with lower extremity chronic venous stasis wound with concern for superimposed infection. He was seen by orthopedic surgery and was recommended for ongoing left BKA. 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 Currently awaiting skilled nursing facility placement.    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 Sharol Given.  He has completed 2 weeks of doxycycline with a stop date 2/9 -PT consulted, recommended skilled nursing facility; [facility] consulted but having issues with placement -Pain control with Neurontin, oxycodone alternating with morphine   Hypertension -Continue Imdur, amlodipine, Lasix.  Blood pressure acceptable   Chronic combined CHF -Appears well compensated, continue Lasix 40 mg p.o. daily.  Has been intermittently refusing labs   COPD -Stable   Anxiety -Continue Atarax as needed   GERD -Continue PPI   Constipation -bowel regimen   Scheduled Meds:  amLODipine  10 mg Oral Daily   vitamin C  1,000 mg Oral Daily   collagenase   Topical Daily   docusate sodium  100 mg Oral Daily   folic acid  1 mg Oral Daily   furosemide  40 mg Oral Daily   gabapentin  900 mg Oral TID   isosorbide mononitrate  15 mg Oral Daily   melatonin  3 mg Oral QHS   multivitamin with minerals  1 tablet Oral Daily   pantoprazole  40 mg Oral Daily   thiamine  100 mg Oral Daily   Continuous Infusions:  magnesium sulfate bolus IVPB     PRN Meds:.albuterol, alum & mag hydroxide-simeth, benzocaine, bisacodyl, diphenhydrAMINE,  guaiFENesin-dextromethorphan, hydrALAZINE, hydrOXYzine, labetalol, magnesium citrate, magnesium sulfate bolus IVPB, morphine injection, ondansetron, oxyCODONE, phenol, polyethylene glycol, traMADol  DVT prophylaxis: SCD's Start: 12/10/22 1731    Code Status: Full Code  Disposition Plan:  Level of care: Med-Surg Status is: Inpatient Remains inpatient appropriate because: needs placement    Consultants:  ortho   Objective: Vitals:   01/25/23 1200 01/25/23 2048 01/26/23 0434 01/26/23 0720  BP: 126/70 130/88 126/76 106/73  Pulse: 79 84 78 87  Resp: '18 16 18   '$ Temp: (!) 97.5 F (36.4 C) (!) 97.5 F (36.4 C) 98.2 F (36.8 C) 98 F (36.7 C)  TempSrc: Oral  Axillary Oral  SpO2: 97% 99% 100% 96%  Weight:   84.8 kg   Height:       No intake or output data in the 24 hours ending 01/26/23 1004   Filed Weights   01/26/23 0434  Weight: 84.8 kg    Examination:  Seen in the hallway as he was doing his morning exercises   Data Reviewed: I have personally reviewed following labs and imaging studies  CBC: No results for input(s): "WBC", "NEUTROABS", "HGB", "HCT", "MCV", "PLT" in the last 168 hours.   Basic Metabolic Panel: No results for input(s): "NA", "K", "CL", "CO2", "GLUCOSE", "BUN", "CREATININE", "CALCIUM", "MG", "PHOS" in the last 168 hours.   GFR: Estimated Creatinine Clearance: 86.2 mL/min (by C-G formula based on SCr of 0.98 mg/dL). Liver Function Tests: No results for input(s): "AST", "ALT", "ALKPHOS", "BILITOT", "PROT", "ALBUMIN" in the last 168 hours. No results for input(s): "LIPASE", "AMYLASE" in  the last 168 hours. No results for input(s): "AMMONIA" in the last 168 hours. Coagulation Profile: No results for input(s): "INR", "PROTIME" in the last 168 hours. Cardiac Enzymes: No results for input(s): "CKTOTAL", "CKMB", "CKMBINDEX", "TROPONINI" in the last 168 hours. BNP (last 3 results) No results for input(s): "PROBNP" in the last 8760 hours. HbA1C: No  results for input(s): "HGBA1C" in the last 72 hours. CBG: No results for input(s): "GLUCAP" in the last 168 hours.  Lipid Profile: No results for input(s): "CHOL", "HDL", "LDLCALC", "TRIG", "CHOLHDL", "LDLDIRECT" in the last 72 hours. Thyroid Function Tests: No results for input(s): "TSH", "T4TOTAL", "FREET4", "T3FREE", "THYROIDAB" in the last 72 hours. Anemia Panel: No results for input(s): "VITAMINB12", "FOLATE", "FERRITIN", "TIBC", "IRON", "RETICCTPCT" in the last 72 hours. Sepsis Labs: No results for input(s): "PROCALCITON", "LATICACIDVEN" in the last 168 hours.  No results found for this or any previous visit (from the past 240 hour(s)).   Radiology Studies: No results found.  Scheduled Meds:  amLODipine  10 mg Oral Daily   vitamin C  1,000 mg Oral Daily   collagenase   Topical Daily   docusate sodium  100 mg Oral Daily   folic acid  1 mg Oral Daily   furosemide  40 mg Oral Daily   gabapentin  900 mg Oral TID   isosorbide mononitrate  15 mg Oral Daily   melatonin  3 mg Oral QHS   multivitamin with minerals  1 tablet Oral Daily   pantoprazole  40 mg Oral Daily   thiamine  100 mg Oral Daily   Continuous Infusions:  magnesium sulfate bolus IVPB       LOS: 50 days  Meric Joye DO  Between 7 am - 7 pm you can contact me via Amion (for emergencies) or Securechat (non urgent matters).  I am not available 7 pm - 7 am, please contact night coverage MD/APP via Amion

## 2023-01-26 NOTE — Progress Notes (Signed)
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 PMH includes asthma, bioplar 1, depression, schizoaffective disorder, CVA, chronic CHF, homelessness, HTN, DM2, cocaine and cannabis use, and R AKA.    PT Comments    Continuing work on functional mobility and activity tolerance;  session focused on safety with funcitonal transfers and therex; Noted [name]'s tendency to place a hand on the wheelchair back, increasing the risk of backwards tilting; will try to focus on safe hand placement, using armrests more, next session   Recommendations for follow up therapy are one component of a multi-disciplinary discharge planning process, led by the attending physician.  Recommendations may be updated based on patient status, additional functional criteria and insurance authorization.  Follow Up Recommendations  Skilled nursing-short term rehab (<3 hours/day) (Pursuing drug addiction rehab, must be modified independent) Can patient physically be transported by private vehicle:  (Yes, but would need assist with lateral scooting wc <>car)   Assistance Recommended at Discharge Intermittent Supervision/Assistance  Patient can return home with the following A little help with walking and/or transfers;A little help with bathing/dressing/bathroom;Assistance with cooking/housework;Assist for transportation;Help with stairs or ramp for entrance   Equipment Recommendations  Other (comment) (pt declining L elevating legrest)    Recommendations for Other Services       Precautions / Restrictions Precautions Precautions: Fall Precaution Comments: R AKA, L BKA Restrictions RLE Weight Bearing: Non weight bearing LLE Weight Bearing: Non weight bearing     Mobility  Bed Mobility                     Transfers Overall transfer level: Needs assistance Equipment used: None Transfers: Bed to chair/wheelchair/BSC            Lateral/Scoot Transfers: Min guard, Min assist General transfer comment: Transferred wc to mat table to the Right, then mat table back to wc on his L; closeguard to steady wc and ultimately pt did not need physical assist with transfer to mat table; Needed min assist to steady WC with transfer mat table back to wc, towards his L side; his WC tipped backwards when he placed his hand on the WC back to boost self up and over teh wheel; with his weight going through the back teh chair tipped backwards, and he needed min assist to steady the wc    Ambulation/Gait                   Stairs             Wheelchair Mobility    Modified Rankin (Stroke Patients Only)       Balance     Sitting balance-Leahy Scale: Good                                      Cognition Arousal/Alertness: Awake/alert   Overall Cognitive Status: No family/caregiver present to determine baseline cognitive functioning Area of Impairment: Safety/judgement                               General Comments: Needs redirection back to tasks at times        Exercises Other Exercises Other Exercises: Bolstered  bridging x10 Other Exercises: seated L hamstring stretching 3 reps with 10 second hold    General Comments        Pertinent Vitals/Pain Pain Assessment Pain Assessment: Faces Faces Pain Scale: Hurts little more Pain Location: pain about hips with bolstered bridging; possible the pain could be hip flexor stretch, but unclear Pain Descriptors / Indicators: Grimacing Pain Intervention(s): Monitored during session    Home Living                          Prior Function            PT Goals (current goals can now be found in the care plan section) Acute Rehab PT Goals Patient Stated Goal: Needs to be able to transfer  independently to "get ready to go to [facility]" PT Goal Formulation: With patient Time For Goal Achievement: 01/26/23 Potential to Achieve Goals: Good Progress towards PT goals: Progressing toward goals    Frequency    Min 1X/week      PT Plan Current plan remains appropriate    Co-evaluation              AM-PAC PT "6 Clicks" Mobility   Outcome Measure  Help needed turning from your back to your side while in a flat bed without using bedrails?: None Help needed moving from lying on your back to sitting on the side of a flat bed without using bedrails?: None Help needed moving to and from a bed to a chair (including a wheelchair)?: None Help needed standing up from a chair using your arms (e.g., wheelchair or bedside chair)?: Total Help needed to walk in hospital room?: Total Help needed climbing 3-5 steps with a railing? : Total 6 Click Score: 15    End of Session   Activity Tolerance: Patient tolerated treatment well Patient left: Other (comment) (in wc) Nurse Communication: Mobility status;Patient requests pain meds PT Visit Diagnosis: Muscle weakness (generalized) (M62.81);Difficulty in walking, not elsewhere classified (R26.2);Other abnormalities of gait and mobility (R26.89)     Time: [id number] PT Time Calculation (min) (ACUTE ONLY): 26 min  Charges:  $Therapeutic Exercise: 8-22 mins $Therapeutic Activity: 8-22 mins                     Roney Marion, PT  Acute Rehabilitation Services Office [phone]    Colletta Maryland 01/26/2023, 7:40 PM

## 2023-01-26 NOTE — Plan of Care (Signed)

## 2023-01-27 DIAGNOSIS — I96 Gangrene, not elsewhere classified: Secondary | ICD-10-CM | POA: Diagnosis not present

## 2023-01-27 MED ORDER — MORPHINE SULFATE (PF) 2 MG/ML IV SOLN: 2.0000 mg | INTRAVENOUS | Status: DC | PRN

## 2023-01-27 MED ORDER — MORPHINE SULFATE (PF) 4 MG/ML IV SOLN
4.0000 mg | INTRAVENOUS | Status: DC | PRN
  Administered 2023-01-27 – 2023-02-01 (×16): 4 mg via INTRAVENOUS
  Filled 2023-01-27 (×19): qty 1

## 2023-01-27 NOTE — Progress Notes (Signed)
PROGRESS NOTE    [name]  [id number] DOB: 09-09-1966 DOA: 12/07/2022 PCP: Marliss Coots, NP    Brief Narrative:  57 year old male with past medical history of chronic combined CHF, CAD, hyperlipidemia, polyneuropathy, bipolar disorder, COPD, homelessness presented with lower extremity chronic venous stasis wound with concern for superimposed infection. He was seen by orthopedic surgery and was recommended for ongoing left BKA. 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 Currently awaiting skilled nursing facility placement.    Subjective: No current complaints  Assessment and Plan:  Sepsis with left foot gangrene -Patient presented with nonhealing left foot wound, failed conservative measures, he underwent left BKA on 12/10/2022 per orthopedics; Dr. Sharol Given.  He has completed 2 weeks of doxycycline with a stop date 2/9 -PT consulted, recommended skilled nursing facility; [facility] consulted but having issues with placement -Pain control with Neurontin, oxycodone alternating with morphine-- seems to be getting more morphine than oxy so try to use morphine as breakthrough-- may need changed to long acting morphine   Hypertension -Continue Imdur, amlodipine, Lasix.  Blood pressure acceptable   Chronic combined CHF -Appears well compensated, continue Lasix 40 mg p.o. daily.  Has been intermittently refusing labs   COPD -Stable   Anxiety -Continue Atarax as needed   GERD -Continue PPI   Constipation -bowel regimen   Scheduled Meds:  amLODipine  10 mg Oral Daily   vitamin C  1,000 mg Oral Daily   collagenase   Topical Daily   docusate sodium  100 mg Oral Daily   folic acid  1 mg Oral Daily   furosemide  40 mg Oral Daily   gabapentin  900 mg Oral TID   isosorbide mononitrate  15 mg Oral Daily   melatonin  3 mg Oral QHS   multivitamin with minerals  1 tablet Oral Daily   pantoprazole  40 mg Oral Daily   thiamine  100 mg Oral Daily   Continuous Infusions:  magnesium sulfate  bolus IVPB     PRN Meds:.albuterol, alum & mag hydroxide-simeth, benzocaine, bisacodyl, diphenhydrAMINE, guaiFENesin-dextromethorphan, hydrALAZINE, hydrOXYzine, labetalol, magnesium citrate, magnesium sulfate bolus IVPB, morphine injection, ondansetron, oxyCODONE, phenol, polyethylene glycol, traMADol  DVT prophylaxis: SCD's Start: 12/10/22 1731    Code Status: Full Code  Disposition Plan:  Level of care: Med-Surg Status is: Inpatient Remains inpatient appropriate because: needs placement    Consultants:  ortho   Objective: Vitals:   01/26/23 0720 01/26/23 1502 01/26/23 1947 01/27/23 0721  BP: 106/73 (!) 130/110 115/71 130/88  Pulse: 87 91 88 72  Resp:   18   Temp: 98 F (36.7 C) 98.2 F (36.8 C) 97.9 F (36.6 C) 98 F (36.7 C)  TempSrc: Oral Oral Oral Oral  SpO2: 96% 98% 96% 99%  Weight:      Height:        Intake/Output Summary (Last 24 hours) at 01/27/2023 1024 Last data filed at 01/26/2023 1652 Gross per 24 hour  Intake 240 ml  Output 400 ml  Net -160 ml     Filed Weights   01/26/23 0434  Weight: 84.8 kg    Examination:  Seen in the hallway as he was doing his morning exercises   Data Reviewed: I have personally reviewed following labs and imaging studies  CBC: No results for input(s): "WBC", "NEUTROABS", "HGB", "HCT", "MCV", "PLT" in the last 168 hours.   Basic Metabolic Panel: No results for input(s): "NA", "K", "CL", "CO2", "GLUCOSE", "BUN", "CREATININE", "CALCIUM", "MG", "PHOS" in the last 168 hours.  GFR: Estimated Creatinine Clearance: 86.2 mL/min (by C-G formula based on SCr of 0.98 mg/dL). Liver Function Tests: No results for input(s): "AST", "ALT", "ALKPHOS", "BILITOT", "PROT", "ALBUMIN" in the last 168 hours. No results for input(s): "LIPASE", "AMYLASE" in the last 168 hours. No results for input(s): "AMMONIA" in the last 168 hours. Coagulation Profile: No results for input(s): "INR", "PROTIME" in the last 168 hours. Cardiac  Enzymes: No results for input(s): "CKTOTAL", "CKMB", "CKMBINDEX", "TROPONINI" in the last 168 hours. BNP (last 3 results) No results for input(s): "PROBNP" in the last 8760 hours. HbA1C: No results for input(s): "HGBA1C" in the last 72 hours. CBG: No results for input(s): "GLUCAP" in the last 168 hours.  Lipid Profile: No results for input(s): "CHOL", "HDL", "LDLCALC", "TRIG", "CHOLHDL", "LDLDIRECT" in the last 72 hours. Thyroid Function Tests: No results for input(s): "TSH", "T4TOTAL", "FREET4", "T3FREE", "THYROIDAB" in the last 72 hours. Anemia Panel: No results for input(s): "VITAMINB12", "FOLATE", "FERRITIN", "TIBC", "IRON", "RETICCTPCT" in the last 72 hours. Sepsis Labs: No results for input(s): "PROCALCITON", "LATICACIDVEN" in the last 168 hours.  No results found for this or any previous visit (from the past 240 hour(s)).   Radiology Studies: No results found.  Scheduled Meds:  amLODipine  10 mg Oral Daily   vitamin C  1,000 mg Oral Daily   collagenase   Topical Daily   docusate sodium  100 mg Oral Daily   folic acid  1 mg Oral Daily   furosemide  40 mg Oral Daily   gabapentin  900 mg Oral TID   isosorbide mononitrate  15 mg Oral Daily   melatonin  3 mg Oral QHS   multivitamin with minerals  1 tablet Oral Daily   pantoprazole  40 mg Oral Daily   thiamine  100 mg Oral Daily   Continuous Infusions:  magnesium sulfate bolus IVPB       LOS: 51 days  [name] Cullens DO  Between 7 am - 7 pm you can contact me via Amion (for emergencies) or Securechat (non urgent matters).  I am not available 7 pm - 7 am, please contact night coverage MD/APP via Amion

## 2023-01-27 NOTE — Plan of Care (Signed)
°  Problem: Fluid Volume: °Goal: Hemodynamic stability will improve °Outcome: Progressing °  °Problem: Clinical Measurements: °Goal: Diagnostic test results will improve °Outcome: Progressing °Goal: Signs and symptoms of infection will decrease °Outcome: Progressing °  °

## 2023-01-27 NOTE — Progress Notes (Signed)
CSW spoke with Jeanette Caprice at [facility] who states patient's referral was denied due to patient's CHF and COPD diagnoses.  Madilyn Fireman, MSW, [name] Transitions of Care  Clinical Social Worker II [phone]

## 2023-01-28 DIAGNOSIS — I96 Gangrene, not elsewhere classified: Secondary | ICD-10-CM | POA: Diagnosis not present

## 2023-01-28 LAB — CBC
HCT: 40.5 % (ref 39.0–52.0)
Hemoglobin: 14 g/dL (ref 13.0–17.0)
MCH: 30.8 pg (ref 26.0–34.0)
MCHC: 34.6 g/dL (ref 30.0–36.0)
MCV: 89 fL (ref 80.0–100.0)
Platelets: 229 10*3/uL (ref 150–400)
RBC: 4.55 MIL/uL (ref 4.22–5.81)
RDW: 14.6 % (ref 11.5–15.5)
WBC: 6.1 10*3/uL (ref 4.0–10.5)
nRBC: 0 % (ref 0.0–0.2)

## 2023-01-28 LAB — BASIC METABOLIC PANEL
Anion gap: 12 (ref 5–15)
BUN: 18 mg/dL (ref 6–20)
CO2: 21 mmol/L — ABNORMAL LOW (ref 22–32)
Calcium: 9.1 mg/dL (ref 8.9–10.3)
Chloride: 105 mmol/L (ref 98–111)
Creatinine, Ser: 0.96 mg/dL (ref 0.61–1.24)
GFR, Estimated: >60 mL/min (ref >60)
Glucose, Bld: 117 mg/dL — ABNORMAL HIGH (ref 70–99)
Potassium: 3.4 mmol/L — ABNORMAL LOW (ref 3.5–5.1)
Sodium: 138 mmol/L (ref 135–145)

## 2023-01-28 NOTE — Progress Notes (Signed)
CSW spoke with patient at bedside to inform him of updates obtained from residential treatment facilities. CSW offered to contact any additional facilities he requests - patient will inform RN of any facilities he wants CSW to contact.  Madilyn Fireman, MSW, [name] Transitions of Care  Clinical Social Worker II [phone]

## 2023-01-28 NOTE — Progress Notes (Signed)
PT Cancellation Note  Patient Details Name: [name] MRN: [id number] DOB: [birth year]   Cancelled Treatment:    Reason Eval/Treat Not Completed: Other (comment)  Pt declined PT this afternoon;  Seemed overwhelmed with situation;   Earlier in the day, this PT contacted Erasmo Downer with [employer], to discuss options for more stable wheelchair anti-tippers for [name]; Plan to meet with Erasmo Downer on Monday;  Had planned to ask [name] if he would let me get in his WC, wheel onto some mats, and push weight shifts (esp posteriorly) to see when his current anti-tippers engage, and how well they work;  I have a feeling [name] has tipped and fallen in his wheelchair before, and I would not ask him to push the limits of the anti-tippers;   Will continue efforts;   Roney Marion, [place] Office [phone]    Colletta Maryland 01/28/2023, 3:54 PM

## 2023-01-28 NOTE — Progress Notes (Signed)
PROGRESS NOTE    [name]  [id number] DOB: December 01, 1966 DOA: 12/07/2022 PCP: Marliss Coots, NP    Brief Narrative:  57 year old male with past medical history of chronic combined CHF, CAD, hyperlipidemia, polyneuropathy, bipolar disorder, COPD, homelessness presented with lower extremity chronic venous stasis wound with concern for superimposed infection. He was seen by orthopedic surgery and was recommended for ongoing left BKA. 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 Currently awaiting skilled nursing facility placement.    Subjective: No complaints   Assessment and Plan:  Sepsis with left foot gangrene -Patient presented with nonhealing left foot wound, failed conservative measures, he underwent left BKA on 12/10/2022 per orthopedics; Dr. Sharol Given.  He has completed 2 weeks of doxycycline with a stop date 2/9 -PT consulted, recommended skilled nursing facility; [facility] consulted but having issues with placement -Pain control with Neurontin, oxycodone alternating with morphine-- seems to be getting more morphine than oxy so try to use morphine as breakthrough-- may need changed to long acting morphine   Hypertension -Continue Imdur, amlodipine, Lasix.  Blood pressure acceptable   Chronic combined CHF -Appears well compensated, continue Lasix 40 mg p.o. daily.  Has been intermittently refusing labs   COPD -Stable   Anxiety -Continue Atarax as needed   GERD -Continue PPI   Constipation -bowel regimen   Scheduled Meds:  amLODipine  10 mg Oral Daily   vitamin C  1,000 mg Oral Daily   collagenase   Topical Daily   docusate sodium  100 mg Oral Daily   folic acid  1 mg Oral Daily   furosemide  40 mg Oral Daily   gabapentin  900 mg Oral TID   isosorbide mononitrate  15 mg Oral Daily   melatonin  3 mg Oral QHS   multivitamin with minerals  1 tablet Oral Daily   pantoprazole  40 mg Oral Daily   thiamine  100 mg Oral Daily   Continuous Infusions:  magnesium sulfate bolus IVPB      PRN Meds:.albuterol, alum & mag hydroxide-simeth, benzocaine, bisacodyl, diphenhydrAMINE, guaiFENesin-dextromethorphan, hydrALAZINE, hydrOXYzine, labetalol, magnesium citrate, magnesium sulfate bolus IVPB, morphine injection, ondansetron, oxyCODONE, phenol, polyethylene glycol, traMADol  DVT prophylaxis: SCD's Start: 12/10/22 1731    Code Status: Full Code  Disposition Plan:  Level of care: Med-Surg Status is: Inpatient Remains inpatient appropriate because: needs placement    Consultants:  ortho   Objective: Vitals:   01/27/23 1428 01/27/23 1952 01/28/23 0709 01/28/23 0724  BP: 99/87 (!) 142/71  133/74  Pulse: 81 79  87  Resp:  17  18  Temp: (!) 97.5 F (36.4 C) 98.5 F (36.9 C)  98.5 F (36.9 C)  TempSrc: Oral   Oral  SpO2: 94% 100%  99%  Weight:   84.3 kg   Height:        Intake/Output Summary (Last 24 hours) at 01/28/2023 1005 Last data filed at 01/27/2023 2021 Gross per 24 hour  Intake --  Output 550 ml  Net -550 ml      Filed Weights   01/26/23 0434 01/28/23 0709  Weight: 84.8 kg 84.3 kg    Examination:  Seen in the hallway as he was doing his morning exercises   Data Reviewed: I have personally reviewed following labs and imaging studies  CBC: Recent Labs  Lab 01/28/23 0120  WBC 6.1  HGB 14.0  HCT 40.5  MCV 89.0  PLT 229     Basic Metabolic Panel: Recent Labs  Lab 01/28/23 0120  NA 138  K 3.4*  CL 105  CO2 21*  GLUCOSE 117*  BUN 18  CREATININE 0.96  CALCIUM 9.1     GFR: Estimated Creatinine Clearance: 88 mL/min (by C-G formula based on SCr of 0.96 mg/dL). Liver Function Tests: No results for input(s): "AST", "ALT", "ALKPHOS", "BILITOT", "PROT", "ALBUMIN" in the last 168 hours. No results for input(s): "LIPASE", "AMYLASE" in the last 168 hours. No results for input(s): "AMMONIA" in the last 168 hours. Coagulation Profile: No results for input(s): "INR", "PROTIME" in the last 168 hours. Cardiac Enzymes: No results for  input(s): "CKTOTAL", "CKMB", "CKMBINDEX", "TROPONINI" in the last 168 hours. BNP (last 3 results) No results for input(s): "PROBNP" in the last 8760 hours. HbA1C: No results for input(s): "HGBA1C" in the last 72 hours. CBG: No results for input(s): "GLUCAP" in the last 168 hours.  Lipid Profile: No results for input(s): "CHOL", "HDL", "LDLCALC", "TRIG", "CHOLHDL", "LDLDIRECT" in the last 72 hours. Thyroid Function Tests: No results for input(s): "TSH", "T4TOTAL", "FREET4", "T3FREE", "THYROIDAB" in the last 72 hours. Anemia Panel: No results for input(s): "VITAMINB12", "FOLATE", "FERRITIN", "TIBC", "IRON", "RETICCTPCT" in the last 72 hours. Sepsis Labs: No results for input(s): "PROCALCITON", "LATICACIDVEN" in the last 168 hours.  No results found for this or any previous visit (from the past 240 hour(s)).   Radiology Studies: No results found.  Scheduled Meds:  amLODipine  10 mg Oral Daily   vitamin C  1,000 mg Oral Daily   collagenase   Topical Daily   docusate sodium  100 mg Oral Daily   folic acid  1 mg Oral Daily   furosemide  40 mg Oral Daily   gabapentin  900 mg Oral TID   isosorbide mononitrate  15 mg Oral Daily   melatonin  3 mg Oral QHS   multivitamin with minerals  1 tablet Oral Daily   pantoprazole  40 mg Oral Daily   thiamine  100 mg Oral Daily   Continuous Infusions:  magnesium sulfate bolus IVPB       LOS: 52 days  Marzetta Board, MD  Between 7 am - 7 pm you can contact me via Amion (for emergencies) or Securechat (non urgent matters).  I am not available 7 pm - 7 am, please contact night coverage MD/APP via Amion

## 2023-01-29 ENCOUNTER — Inpatient Hospital Stay (HOSPITAL_COMMUNITY): Payer: Medicaid Other

## 2023-01-29 DIAGNOSIS — I96 Gangrene, not elsewhere classified: Secondary | ICD-10-CM | POA: Diagnosis not present

## 2023-01-29 MED ORDER — MOMETASONE FURO-FORMOTEROL FUM 100-5 MCG/ACT IN AERO
2.0000 | INHALATION_SPRAY | Freq: Two times a day (BID) | RESPIRATORY_TRACT | Status: DC
  Administered 2023-01-29 – 2023-04-08 (×83): 2 via RESPIRATORY_TRACT
  Filled 2023-01-29 (×6): qty 8.8

## 2023-01-29 NOTE — Plan of Care (Signed)
  Problem: Fluid Volume: Goal: Hemodynamic stability will improve Outcome: Not Progressing   Problem: Clinical Measurements: Goal: Diagnostic test results will improve Outcome: Not Progressing Goal: Signs and symptoms of infection will decrease Outcome: Not Progressing   Problem: Respiratory: Goal: Ability to maintain adequate ventilation will improve Outcome: Not Progressing   Problem: Education: Goal: Knowledge of General Education information will improve Description: Including pain rating scale, medication(s)/side effects and non-pharmacologic comfort measures Outcome: Not Progressing   Problem: Health Behavior/Discharge Planning: Goal: Ability to manage health-related needs will improve Outcome: Not Progressing

## 2023-01-29 NOTE — Progress Notes (Signed)
Mobility Specialist Progress Note    01/29/23 1722  Mobility  Activity  (wheelchair mobility)  Level of Assistance Modified independent, requires aide device or extra time  Assistive Device Wheelchair  Distance Ambulated (ft) 1000 ft (propelled)  RLE Weight Bearing NWB  LLE Weight Bearing NWB  Activity Response Tolerated well  Mobility Referral Yes  $Mobility charge 1 Mobility   Pt received in room and requesting to go outside. No complaints. Returned to unit where pt continuing to wheel around hallway.   Hildred Alamin Mobility Specialist  Please Psychologist, sport and exercise or Rehab Office at [phone]

## 2023-01-29 NOTE — Progress Notes (Signed)
PROGRESS NOTE    [name]  [id number] DOB: 22-Nov-1966 DOA: 12/07/2022 PCP: Marliss Coots, NP    Brief Narrative:  57 year old male with past medical history of chronic combined CHF, CAD, hyperlipidemia, polyneuropathy, bipolar disorder, COPD, homelessness presented with lower extremity chronic venous stasis wound with concern for superimposed infection. He was seen by orthopedic surgery and was recommended for ongoing left BKA. 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 Currently awaiting skilled nursing facility placement.    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 Sharol Given.  He has completed 2 weeks of doxycycline with a stop date 2/9 -PT consulted, recommended skilled nursing facility; [facility] consulted but having issues with placement -Pain control with Neurontin, oxycodone alternating with morphine-- seems to be getting more morphine than oxy so try to use morphine as breakthrough-- may need changed to long acting morphine   Hypertension -Continue Imdur, amlodipine, Lasix.  Blood pressure acceptable   Chronic combined CHF -Appears well compensated, continue Lasix 40 mg p.o. daily.  Has been intermittently refusing labs   COPD -Stable, but has been having intermittent shortness of breath, start Dulera   Anxiety -Continue Atarax as needed   GERD -Continue PPI   Constipation -bowel regimen   Scheduled Meds:  amLODipine  10 mg Oral Daily   vitamin C  1,000 mg Oral Daily   collagenase   Topical Daily   docusate sodium  100 mg Oral Daily   folic acid  1 mg Oral Daily   furosemide  40 mg Oral Daily   gabapentin  900 mg Oral TID   isosorbide mononitrate  15 mg Oral Daily   melatonin  3 mg Oral QHS   mometasone-formoterol  2 puff  Inhalation BID   multivitamin with minerals  1 tablet Oral Daily   pantoprazole  40 mg Oral Daily   thiamine  100 mg Oral Daily   Continuous Infusions:  magnesium sulfate bolus IVPB     PRN Meds:.albuterol, alum & mag hydroxide-simeth, benzocaine, bisacodyl, diphenhydrAMINE, guaiFENesin-dextromethorphan, hydrALAZINE, hydrOXYzine, labetalol, magnesium citrate, magnesium sulfate bolus IVPB, morphine injection, ondansetron, oxyCODONE, phenol, polyethylene glycol, traMADol  DVT prophylaxis: SCD's Start: 12/10/22 1731    Code Status: Full Code  Disposition Plan:  Level of care: Med-Surg Status is: Inpatient Remains inpatient appropriate because: needs placement    Consultants:  ortho   Objective: Vitals:   01/28/23 0724 01/28/23 1244 01/29/23 0725 01/29/23 0725  BP: 133/74 132/82  (!) 151/103  Pulse: 87 89  77  Resp: 18 18    Temp: 98.5 F (36.9 C) 98 F (36.7 C)  97.6 F (36.4 C)  TempSrc: Oral   Oral  SpO2: 99% 97%  99%  Weight:   84.4 kg   Height:        Intake/Output Summary (Last 24 hours) at 01/29/2023 1000 Last data filed at 01/28/2023 2230 Gross per 24 hour  Intake 240 ml  Output --  Net 240 ml      Filed Weights   01/26/23 0434 01/28/23 0709 01/29/23 0725  Weight: 84.8 kg 84.3 kg 84.4 kg    Examination:  Constitutional: NAD Respiratory: clear to auscultation bilaterally, no wheezing, no crackles. Normal respiratory effort.  Cardiovascular: Regular rate and rhythm, no murmurs / rubs / gallops.   Data Reviewed: I have personally reviewed  following labs and imaging studies  CBC: Recent Labs  Lab 01/28/23 0120  WBC 6.1  HGB 14.0  HCT 40.5  MCV 89.0  PLT 229      Basic Metabolic Panel: Recent Labs  Lab 01/28/23 0120  NA 138  K 3.4*  CL 105  CO2 21*  GLUCOSE 117*  BUN 18  CREATININE 0.96  CALCIUM 9.1      GFR: Estimated Creatinine Clearance: 88 mL/min (by C-G formula based on SCr of 0.96 mg/dL). Liver Function Tests: No  results for input(s): "AST", "ALT", "ALKPHOS", "BILITOT", "PROT", "ALBUMIN" in the last 168 hours. No results for input(s): "LIPASE", "AMYLASE" in the last 168 hours. No results for input(s): "AMMONIA" in the last 168 hours. Coagulation Profile: No results for input(s): "INR", "PROTIME" in the last 168 hours. Cardiac Enzymes: No results for input(s): "CKTOTAL", "CKMB", "CKMBINDEX", "TROPONINI" in the last 168 hours. BNP (last 3 results) No results for input(s): "PROBNP" in the last 8760 hours. HbA1C: No results for input(s): "HGBA1C" in the last 72 hours. CBG: No results for input(s): "GLUCAP" in the last 168 hours.  Lipid Profile: No results for input(s): "CHOL", "HDL", "LDLCALC", "TRIG", "CHOLHDL", "LDLDIRECT" in the last 72 hours. Thyroid Function Tests: No results for input(s): "TSH", "T4TOTAL", "FREET4", "T3FREE", "THYROIDAB" in the last 72 hours. Anemia Panel: No results for input(s): "VITAMINB12", "FOLATE", "FERRITIN", "TIBC", "IRON", "RETICCTPCT" in the last 72 hours. Sepsis Labs: No results for input(s): "PROCALCITON", "LATICACIDVEN" in the last 168 hours.  No results found for this or any previous visit (from the past 240 hour(s)).   Radiology Studies: No results found.  Scheduled Meds:  amLODipine  10 mg Oral Daily   vitamin C  1,000 mg Oral Daily   collagenase   Topical Daily   docusate sodium  100 mg Oral Daily   folic acid  1 mg Oral Daily   furosemide  40 mg Oral Daily   gabapentin  900 mg Oral TID   isosorbide mononitrate  15 mg Oral Daily   melatonin  3 mg Oral QHS   mometasone-formoterol  2 puff Inhalation BID   multivitamin with minerals  1 tablet Oral Daily   pantoprazole  40 mg Oral Daily   thiamine  100 mg Oral Daily   Continuous Infusions:  magnesium sulfate bolus IVPB       LOS: 53 days  Marzetta Board, MD  Between 7 am - 7 pm you can contact me via Amion (for emergencies) or Securechat (non urgent matters).  I am not available 7 pm - 7 am,  please contact night coverage MD/APP via Amion

## 2023-01-30 DIAGNOSIS — I96 Gangrene, not elsewhere classified: Secondary | ICD-10-CM | POA: Diagnosis not present

## 2023-01-30 MED ORDER — ORAL CARE MOUTH RINSE: 15.0000 mL | OROMUCOSAL | Status: DC | PRN

## 2023-01-30 NOTE — Progress Notes (Signed)
Responded to bedside at patient's request per Charge RN/Primary RN Lenny Pastel.   Per initial conversation with Lenny Pastel, RN I was informed that patient asked to speak with higher leadership related to his pain management plan. Prior to responding to the unit, I advised Lenny Pastel to contact the provider regarding this since the patient's request exceeded the limitations of the currently ordered pain medications. Lenny Pastel, RN made this contact and updated me stating that the provider did not alter the pain management orders and that the patient again requested to speak with me.   I entered the patient's room, greeted the patient, introduced myself, and asked what I could do for him. The patient proceeded to explain that "this nurse" (speaking about Lenny Pastel), came in here and changed his medications around. He stated that what he had been getting was working just fine. I asked for specifics and patient explained that he'd been getting morphine every 3 hours and oxycodone every 4 hours (The patient's account of what he has been receiving here does not align with what is charted. The chart shows that in approximately the past 48 hours he has only received 2 doses of oxycodone '20mg'$  IR and 2 doses of morphine '4mg'$  IV.).   I opened the patient's chart and reviewed his pain management orders with him. The patient's primary concern centered around not being able to get morphine as the first intervention. He stated that Dr Sharol Given told him that he could get morphine every 3 hours. I reinforced what Lenny Pastel, RN had informed Mr Feemster of which was that we may only administer morphine if the PO medications are not managing his pain per the current orders. Patient responding by asking "so you're saying that the pills are my only option then?" and I confirmed. Patient then asked if I would get the oxycodone for him, so I did. Pain assessed and administered oxycodone as reflected in MAR.   Ensured patient had no other needs prior to  departing and he stated nothing further. Updated Lenny Pastel, RN as to the content of my interaction.

## 2023-01-30 NOTE — Plan of Care (Signed)
  Problem: Fluid Volume: Goal: Hemodynamic stability will improve Outcome: Not Progressing   Problem: Clinical Measurements: Goal: Diagnostic test results will improve Outcome: Not Progressing Goal: Signs and symptoms of infection will decrease Outcome: Not Progressing   Problem: Respiratory: Goal: Ability to maintain adequate ventilation will improve Outcome: Not Progressing   Problem: Education: Goal: Knowledge of General Education information will improve Description: Including pain rating scale, medication(s)/side effects and non-pharmacologic comfort measures Outcome: Not Progressing   Problem: Health Behavior/Discharge Planning: Goal: Ability to manage health-related needs will improve Outcome: Not Progressing   Problem: Clinical Measurements: Goal: Ability to maintain clinical measurements within normal limits will improve Outcome: Not Progressing Goal: Will remain free from infection Outcome: Not Progressing Goal: Diagnostic test results will improve Outcome: Not Progressing Goal: Respiratory complications will improve Outcome: Not Progressing Goal: Cardiovascular complication will be avoided Outcome: Not Progressing   Problem: Activity: Goal: Risk for activity intolerance will decrease Outcome: Not Progressing   Problem: Nutrition: Goal: Adequate nutrition will be maintained Outcome: Not Progressing   Problem: Coping: Goal: Level of anxiety will decrease Outcome: Not Progressing

## 2023-01-30 NOTE — Progress Notes (Incomplete)
Responded to bedside at patient's request per Charge RN/Primary RN Lenny Pastel.   Per initial conversation with Lenny Pastel, RN I was informed that patient asked to speak with higher leadership related to his pain management plan. Prior to responding to the unit, I advised Lenny Pastel to contact the provider regarding this since the patient's request exceeded the limitations of the currently ordered pain medications. Lenny Pastel, RN made this contact and updated me stating that the provider did not alter the pain management orders and that the patient again requested to speak with me.   I entered the patient's room, greeted the patient, introduced myself, and asked what I could do for him. The patient proceeded to explain that "this nurse" (speaking about Lenny Pastel), came in here and changed his medications around. He stated that what he had been getting was working just fine. I asked for specifics and patient explained that he'd been getting morphine every 3 hours and oxycodone every 4 hours (The patient's account of what he has been receiving here does not align with what is charted. The chart shows that in approximately the past 48 hours he has only received 2 doses of oxycodone '20mg'$  IR and 2 doses of morphine '4mg'$  IV.).   I opened the patient's chart and reviewed his pain management orders with him. The patient's primary

## 2023-01-30 NOTE — Progress Notes (Signed)
PROGRESS NOTE    [name]  [id number] DOB: 1966/02/13 DOA: 12/07/2022 PCP: Marliss Coots, NP    Brief Narrative:  57 year old male with past medical history of chronic combined CHF, CAD, hyperlipidemia, polyneuropathy, bipolar disorder, COPD, homelessness presented with lower extremity chronic venous stasis wound with concern for superimposed infection. He was seen by orthopedic surgery and was recommended for ongoing left BKA. 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 Currently awaiting skilled nursing facility placement.    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 Sharol Given.  He has completed 2 weeks of doxycycline with a stop date 2/9 -PT consulted, recommended skilled nursing facility; [facility] consulted but having issues with placement -Pain control with Neurontin, oxycodone alternating with morphine-- seems to be getting more morphine than oxy so try to use morphine as breakthrough-- may need changed to long acting morphine   Hypertension -Continue Imdur, amlodipine, Lasix.  Blood pressure acceptable   Chronic combined CHF -Appears well compensated, continue Lasix 40 mg p.o. daily.  Has been intermittently refusing labs   COPD -Stable, but has been having intermittent shortness of breath, start Dulera   Anxiety -Continue Atarax as needed   GERD -Continue PPI   Constipation -bowel regimen   Scheduled Meds:  amLODipine  10 mg Oral Daily   vitamin C  1,000 mg Oral Daily   collagenase   Topical Daily   docusate sodium  100 mg Oral Daily   folic acid  1 mg Oral Daily   furosemide  40 mg Oral Daily   gabapentin  900 mg Oral TID   isosorbide mononitrate  15 mg Oral Daily   melatonin  3 mg Oral QHS   mometasone-formoterol  2 puff Inhalation BID   multivitamin with minerals  1 tablet Oral  Daily   pantoprazole  40 mg Oral Daily   thiamine  100 mg Oral Daily   Continuous Infusions:  magnesium sulfate bolus IVPB     PRN Meds:.albuterol, alum & mag hydroxide-simeth, benzocaine, bisacodyl, diphenhydrAMINE, guaiFENesin-dextromethorphan, hydrALAZINE, hydrOXYzine, labetalol, magnesium citrate, magnesium sulfate bolus IVPB, morphine injection, ondansetron, oxyCODONE, phenol, polyethylene glycol, traMADol  DVT prophylaxis: SCD's Start: 12/10/22 1731    Code Status: Full Code  Disposition Plan:  Level of care: Med-Surg Status is: Inpatient Remains inpatient appropriate because: needs placement    Consultants:  ortho   Objective: Vitals:   01/29/23 0725 01/29/23 1437 01/29/23 1928 01/30/23 0736  BP: (!) 151/103 121/88 136/73 117/82  Pulse: 77 (!) 50 72 83  Resp:   16 18  Temp: 97.6 F (36.4 C) 98.2 F (36.8 C) 98.6 F (37 C) 98.8 F (37.1 C)  TempSrc: Oral Oral Oral Oral  SpO2: 99% 100% 100% 100%  Weight:    84.5 kg  Height:       No intake or output data in the 24 hours ending 01/30/23 1145    Filed Weights   01/28/23 0709 01/29/23 0725 01/30/23 0736  Weight: 84.3 kg 84.4 kg 84.5 kg    Examination:  Constitutional: NAD   Data Reviewed: I have personally reviewed following labs and imaging studies  CBC: Recent Labs  Lab 01/28/23 0120  WBC 6.1  HGB 14.0  HCT 40.5  MCV 89.0  PLT 229      Basic Metabolic Panel: Recent Labs  Lab 01/28/23 0120  NA 138  K 3.4*  CL 105  CO2 21*  GLUCOSE 117*  BUN 18  CREATININE 0.96  CALCIUM 9.1      GFR: Estimated Creatinine Clearance: 88 mL/min (by C-G formula based on SCr of 0.96 mg/dL). Liver Function Tests: No results for input(s): "AST", "ALT", "ALKPHOS", "BILITOT", "PROT", "ALBUMIN" in the last 168 hours. No results for input(s): "LIPASE", "AMYLASE" in the last 168 hours. No results for input(s): "AMMONIA" in the last 168 hours. Coagulation Profile: No results for input(s): "INR",  "PROTIME" in the last 168 hours. Cardiac Enzymes: No results for input(s): "CKTOTAL", "CKMB", "CKMBINDEX", "TROPONINI" in the last 168 hours. BNP (last 3 results) No results for input(s): "PROBNP" in the last 8760 hours. HbA1C: No results for input(s): "HGBA1C" in the last 72 hours. CBG: No results for input(s): "GLUCAP" in the last 168 hours.  Lipid Profile: No results for input(s): "CHOL", "HDL", "LDLCALC", "TRIG", "CHOLHDL", "LDLDIRECT" in the last 72 hours. Thyroid Function Tests: No results for input(s): "TSH", "T4TOTAL", "FREET4", "T3FREE", "THYROIDAB" in the last 72 hours. Anemia Panel: No results for input(s): "VITAMINB12", "FOLATE", "FERRITIN", "TIBC", "IRON", "RETICCTPCT" in the last 72 hours. Sepsis Labs: No results for input(s): "PROCALCITON", "LATICACIDVEN" in the last 168 hours.  No results found for this or any previous visit (from the past 240 hour(s)).   Radiology Studies: DG CHEST PORT 1 VIEW  Result Date: 01/29/2023 CLINICAL DATA:  Dyspnea EXAM: PORTABLE CHEST 1 VIEW COMPARISON:  12/13/2022. FINDINGS: Similar mild opacities in the right lung. No confluent consolidation. No visible pleural effusions or pneumothorax. Cardiomediastinal silhouette is similar. IMPRESSION: Similar mild opacities in the right lung, possibly related to process described on recent CT of the chest. No new confluent consolidation. Electronically Signed   By: Margaretha Sheffield M.D.   On: 01/29/2023 12:56    Scheduled Meds:  amLODipine  10 mg Oral Daily   vitamin C  1,000 mg Oral Daily   collagenase   Topical Daily   docusate sodium  100 mg Oral Daily   folic acid  1 mg Oral Daily   furosemide  40 mg Oral Daily   gabapentin  900 mg Oral TID   isosorbide mononitrate  15 mg Oral Daily   melatonin  3 mg Oral QHS   mometasone-formoterol  2 puff Inhalation BID   multivitamin with minerals  1 tablet Oral Daily   pantoprazole  40 mg Oral Daily   thiamine  100 mg Oral Daily   Continuous  Infusions:  magnesium sulfate bolus IVPB       LOS: 54 days  Marzetta Board, MD  Between 7 am - 7 pm you can contact me via Amion (for emergencies) or Securechat (non urgent matters).  I am not available 7 pm - 7 am, please contact night coverage MD/APP via Amion

## 2023-01-30 NOTE — Progress Notes (Addendum)
Patient refusing to take oxycodone. Requesting Morphine IV medication only. The order and doctors note says use Morphine for breakthrough pain only.  Messaged NP about changing order. Called the [facility] as patient wished to talk to him about this. AC  will come to talk to patient.

## 2023-01-31 DIAGNOSIS — I96 Gangrene, not elsewhere classified: Secondary | ICD-10-CM | POA: Diagnosis not present

## 2023-01-31 NOTE — Progress Notes (Signed)
PROGRESS NOTE    [name]  [id number] DOB: [birth year] DOA: 12/07/2022 PCP: Marliss Coots, NP    Brief Narrative:  57 year old male with past medical history of chronic combined CHF, CAD, hyperlipidemia, polyneuropathy, bipolar disorder, COPD, homelessness presented with lower extremity chronic venous stasis wound with concern for superimposed infection. He was seen by orthopedic surgery and was recommended for ongoing left BKA. 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 Currently awaiting skilled nursing facility placement.    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 Sharol Given.  He has completed 2 weeks of doxycycline with a stop date 2/9 -PT consulted, recommended skilled nursing facility; [facility] consulted but having issues with placement -Pain control with Neurontin, oxycodone alternating with morphine-- seems to be getting more morphine than oxy so try to use morphine as breakthrough-- may need changed to long acting morphine   Hypertension -Continue Imdur, amlodipine, Lasix.  Blood pressure acceptable   Chronic combined CHF -Appears well compensated, continue Lasix 40 mg p.o. daily.  Has been intermittently refusing labs   COPD -Stable, but has been having intermittent shortness of breath, start Dulera   Anxiety -Continue Atarax as needed   GERD -Continue PPI   Constipation -bowel regimen   Scheduled Meds:  amLODipine  10 mg Oral Daily   vitamin C  1,000 mg Oral Daily   collagenase   Topical Daily   docusate sodium  100 mg Oral Daily   folic acid  1 mg Oral Daily   furosemide  40 mg Oral Daily   gabapentin  900 mg Oral TID   isosorbide mononitrate  15 mg Oral Daily   melatonin  3 mg Oral QHS   mometasone-formoterol  2 puff Inhalation BID   multivitamin with minerals  1 tablet Oral  Daily   pantoprazole  40 mg Oral Daily   thiamine  100 mg Oral Daily   Continuous Infusions:  magnesium sulfate bolus IVPB     PRN Meds:.albuterol, alum & mag hydroxide-simeth, benzocaine, bisacodyl, diphenhydrAMINE, guaiFENesin-dextromethorphan, hydrALAZINE, hydrOXYzine, labetalol, magnesium citrate, magnesium sulfate bolus IVPB, morphine injection, ondansetron, mouth rinse, oxyCODONE, phenol, polyethylene glycol, traMADol  DVT prophylaxis: SCD's Start: 12/10/22 1731    Code Status: Full Code  Disposition Plan:  Level of care: Med-Surg Status is: Inpatient Remains inpatient appropriate because: needs placement    Consultants:  ortho   Objective: Vitals:   01/29/23 1928 01/30/23 0736 01/31/23 0758 01/31/23 0811  BP: 136/73 117/82 (!) 167/107   Pulse: 72 83 86 86  Resp: '16 18 19 18  '$ Temp: 98.6 F (37 C) 98.8 F (37.1 C) 98 F (36.7 C)   TempSrc: Oral Oral Oral   SpO2: 100% 100% 100%   Weight:  84.5 kg    Height:        Intake/Output Summary (Last 24 hours) at 01/31/2023 1113 Last data filed at 01/31/2023 0930 Gross per 24 hour  Intake 120 ml  Output --  Net 120 ml      Filed Weights   01/28/23 0709 01/29/23 0725 01/30/23 0736  Weight: 84.3 kg 84.4 kg 84.5 kg    Examination:  Constitutional: No distress   Data Reviewed: I have personally reviewed following labs and imaging studies  CBC: Recent Labs  Lab 01/28/23 0120  WBC 6.1  HGB 14.0  HCT 40.5  MCV 89.0  PLT 229  Basic Metabolic Panel: Recent Labs  Lab 01/28/23 0120  NA 138  K 3.4*  CL 105  CO2 21*  GLUCOSE 117*  BUN 18  CREATININE 0.96  CALCIUM 9.1      GFR: Estimated Creatinine Clearance: 88 mL/min (by C-G formula based on SCr of 0.96 mg/dL). Liver Function Tests: No results for input(s): "AST", "ALT", "ALKPHOS", "BILITOT", "PROT", "ALBUMIN" in the last 168 hours. No results for input(s): "LIPASE", "AMYLASE" in the last 168 hours. No results for input(s): "AMMONIA" in  the last 168 hours. Coagulation Profile: No results for input(s): "INR", "PROTIME" in the last 168 hours. Cardiac Enzymes: No results for input(s): "CKTOTAL", "CKMB", "CKMBINDEX", "TROPONINI" in the last 168 hours. BNP (last 3 results) No results for input(s): "PROBNP" in the last 8760 hours. HbA1C: No results for input(s): "HGBA1C" in the last 72 hours. CBG: No results for input(s): "GLUCAP" in the last 168 hours.  Lipid Profile: No results for input(s): "CHOL", "HDL", "LDLCALC", "TRIG", "CHOLHDL", "LDLDIRECT" in the last 72 hours. Thyroid Function Tests: No results for input(s): "TSH", "T4TOTAL", "FREET4", "T3FREE", "THYROIDAB" in the last 72 hours. Anemia Panel: No results for input(s): "VITAMINB12", "FOLATE", "FERRITIN", "TIBC", "IRON", "RETICCTPCT" in the last 72 hours. Sepsis Labs: No results for input(s): "PROCALCITON", "LATICACIDVEN" in the last 168 hours.  No results found for this or any previous visit (from the past 240 hour(s)).   Radiology Studies: DG CHEST PORT 1 VIEW  Result Date: 01/29/2023 CLINICAL DATA:  Dyspnea EXAM: PORTABLE CHEST 1 VIEW COMPARISON:  12/13/2022. FINDINGS: Similar mild opacities in the right lung. No confluent consolidation. No visible pleural effusions or pneumothorax. Cardiomediastinal silhouette is similar. IMPRESSION: Similar mild opacities in the right lung, possibly related to process described on recent CT of the chest. No new confluent consolidation. Electronically Signed   By: Margaretha Sheffield M.D.   On: 01/29/2023 12:56    Scheduled Meds:  amLODipine  10 mg Oral Daily   vitamin C  1,000 mg Oral Daily   collagenase   Topical Daily   docusate sodium  100 mg Oral Daily   folic acid  1 mg Oral Daily   furosemide  40 mg Oral Daily   gabapentin  900 mg Oral TID   isosorbide mononitrate  15 mg Oral Daily   melatonin  3 mg Oral QHS   mometasone-formoterol  2 puff Inhalation BID   multivitamin with minerals  1 tablet Oral Daily    pantoprazole  40 mg Oral Daily   thiamine  100 mg Oral Daily   Continuous Infusions:  magnesium sulfate bolus IVPB       LOS: 55 days  Marzetta Board, MD  Between 7 am - 7 pm you can contact me via Amion (for emergencies) or Securechat (non urgent matters).  I am not available 7 pm - 7 am, please contact night coverage MD/APP via Amion

## 2023-01-31 NOTE — Progress Notes (Signed)
CSW spoke with Hinton Dyer [place] of [place] who states he is working with patient to secure permanent housing. Hinton Dyer states he spoke with patient at bedside on Wednesday. Hinton Dyer reports he has built a Engineer, production with patient due to him previously living at [facility]. Hinton Dyer requesting CSW contact [facility] in [place], [place] [phone]).  CSW spoke with Network engineer at [facility] who states clinical information can be faxed to [phone] for review by admissions staff on Monday morning.  CSW sent clinicals for review.  Madilyn Fireman, MSW, [name] Transitions of Care  Clinical Social Worker II [phone]

## 2023-01-31 NOTE — Plan of Care (Signed)
°  Problem: Fluid Volume: °Goal: Hemodynamic stability will improve °Outcome: Progressing °  °Problem: Clinical Measurements: °Goal: Diagnostic test results will improve °Outcome: Progressing °Goal: Signs and symptoms of infection will decrease °Outcome: Progressing °  °

## 2023-01-31 NOTE — Progress Notes (Signed)
Patient took his Oxycodone from the [facility].

## 2023-02-01 DIAGNOSIS — I96 Gangrene, not elsewhere classified: Secondary | ICD-10-CM | POA: Diagnosis not present

## 2023-02-01 MED ORDER — MORPHINE SULFATE ER 15 MG PO TBCR: 15.0000 mg | EXTENDED_RELEASE_TABLET | Freq: Two times a day (BID) | ORAL | Status: DC

## 2023-02-01 MED ORDER — MORPHINE SULFATE ER 15 MG PO TBCR
30.0000 mg | EXTENDED_RELEASE_TABLET | Freq: Two times a day (BID) | ORAL | Status: DC
  Administered 2023-02-01 – 2023-05-24 (×211): 30 mg via ORAL
  Filled 2023-02-01 (×222): qty 2

## 2023-02-01 NOTE — Progress Notes (Signed)
PROGRESS NOTE    [name]  [id number] DOB: 1966-09-21 DOA: 12/07/2022 PCP: Marliss Coots, NP    Brief Narrative:  57 year old male with past medical history of chronic combined CHF, CAD, hyperlipidemia, polyneuropathy, bipolar disorder, COPD, homelessness presented with lower extremity chronic venous stasis wound with concern for superimposed infection. He was seen by orthopedic surgery and was recommended for ongoing left BKA. 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 Currently awaiting skilled nursing facility placement.    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 Sharol Given.  He has completed 2 weeks of doxycycline with a stop date 2/9 -PT consulted, recommended skilled nursing facility; [facility] consulted but having issues with placement -Pain control with Neurontin, oxycodone alternating with morphine.  Changed to long-acting morphine today   Hypertension -Continue Imdur, amlodipine, Lasix.  Blood pressure is acceptable   Chronic combined CHF -Appears well compensated, continue Lasix 40 mg p.o. daily.  Has been intermittently refusing labs   COPD -Stable, but has been having intermittent shortness of breath, start Dulera.  No wheezing today   Anxiety -Continue Atarax as needed   GERD -Continue PPI   Constipation -bowel regimen   Scheduled Meds:  amLODipine  10 mg Oral Daily   vitamin C  1,000 mg Oral Daily   collagenase   Topical Daily   docusate sodium  100 mg Oral Daily   folic acid  1 mg Oral Daily   furosemide  40 mg Oral Daily   gabapentin  900 mg Oral TID   isosorbide mononitrate  15 mg Oral Daily   melatonin  3 mg Oral QHS   mometasone-formoterol  2 puff Inhalation BID   morphine  15 mg Oral Q12H   multivitamin with minerals  1 tablet Oral Daily   pantoprazole  40 mg  Oral Daily   thiamine  100 mg Oral Daily   Continuous Infusions:  magnesium sulfate bolus IVPB     PRN Meds:.albuterol, alum & mag hydroxide-simeth, benzocaine, bisacodyl, diphenhydrAMINE, guaiFENesin-dextromethorphan, hydrALAZINE, hydrOXYzine, labetalol, magnesium citrate, magnesium sulfate bolus IVPB, ondansetron, mouth rinse, oxyCODONE, phenol, polyethylene glycol, traMADol  DVT prophylaxis: SCD's Start: 12/10/22 1731    Code Status: Full Code  Disposition Plan:  Level of care: Med-Surg Status is: Inpatient Remains inpatient appropriate because: needs placement    Consultants:  ortho   Objective: Vitals:   01/31/23 0811 01/31/23 2215 02/01/23 0439 02/01/23 0709  BP:  (!) 148/86 (!) 132/91 (!) 129/94  Pulse: 86 85 82 86  Resp: 18 16    Temp:  98.1 F (36.7 C) 97.9 F (36.6 C) 97.9 F (36.6 C)  TempSrc:  Oral  Oral  SpO2:  98% 99% 95%  Weight:      Height:        Intake/Output Summary (Last 24 hours) at 02/01/2023 0744 Last data filed at 02/01/2023 0458 Gross per 24 hour  Intake 120 ml  Output 450 ml  Net -330 ml      Filed Weights   01/28/23 0709 01/29/23 0725 01/30/23 0736  Weight: 84.3 kg 84.4 kg 84.5 kg    Examination:  Constitutional: NAD Eyes: lids and conjunctivae normal, no scleral icterus ENMT: mmm Neck: normal, supple Respiratory: clear to auscultation bilaterally, no wheezing, no crackles. Normal respiratory effort.  Cardiovascular: Regular rate and rhythm, no murmurs / rubs / gallops. No LE edema.  Abdomen: soft, no distention, no tenderness. Bowel sounds positive.   Data Reviewed: I have personally reviewed following labs and imaging studies  CBC: Recent Labs  Lab 01/28/23 0120  WBC 6.1  HGB 14.0  HCT 40.5  MCV 89.0  PLT 229      Basic Metabolic Panel: Recent Labs  Lab 01/28/23 0120  NA 138  K 3.4*  CL 105  CO2 21*  GLUCOSE 117*  BUN 18  CREATININE 0.96  CALCIUM 9.1      GFR: Estimated Creatinine Clearance: 88  mL/min (by C-G formula based on SCr of 0.96 mg/dL). Liver Function Tests: No results for input(s): "AST", "ALT", "ALKPHOS", "BILITOT", "PROT", "ALBUMIN" in the last 168 hours. No results for input(s): "LIPASE", "AMYLASE" in the last 168 hours. No results for input(s): "AMMONIA" in the last 168 hours. Coagulation Profile: No results for input(s): "INR", "PROTIME" in the last 168 hours. Cardiac Enzymes: No results for input(s): "CKTOTAL", "CKMB", "CKMBINDEX", "TROPONINI" in the last 168 hours. BNP (last 3 results) No results for input(s): "PROBNP" in the last 8760 hours. HbA1C: No results for input(s): "HGBA1C" in the last 72 hours. CBG: No results for input(s): "GLUCAP" in the last 168 hours.  Lipid Profile: No results for input(s): "CHOL", "HDL", "LDLCALC", "TRIG", "CHOLHDL", "LDLDIRECT" in the last 72 hours. Thyroid Function Tests: No results for input(s): "TSH", "T4TOTAL", "FREET4", "T3FREE", "THYROIDAB" in the last 72 hours. Anemia Panel: No results for input(s): "VITAMINB12", "FOLATE", "FERRITIN", "TIBC", "IRON", "RETICCTPCT" in the last 72 hours. Sepsis Labs: No results for input(s): "PROCALCITON", "LATICACIDVEN" in the last 168 hours.  No results found for this or any previous visit (from the past 240 hour(s)).   Radiology Studies: No results found.  Scheduled Meds:  amLODipine  10 mg Oral Daily   vitamin C  1,000 mg Oral Daily   collagenase   Topical Daily   docusate sodium  100 mg Oral Daily   folic acid  1 mg Oral Daily   furosemide  40 mg Oral Daily   gabapentin  900 mg Oral TID   isosorbide mononitrate  15 mg Oral Daily   melatonin  3 mg Oral QHS   mometasone-formoterol  2 puff Inhalation BID   morphine  15 mg Oral Q12H   multivitamin with minerals  1 tablet Oral Daily   pantoprazole  40 mg Oral Daily   thiamine  100 mg Oral Daily   Continuous Infusions:  magnesium sulfate bolus IVPB       LOS: 56 days  Marzetta Board, MD  Between 7 am - 7 pm you can  contact me via Amion (for emergencies) or Securechat (non urgent matters).  I am not available 7 pm - 7 am, please contact night coverage MD/APP via Amion

## 2023-02-01 NOTE — Plan of Care (Signed)
  Problem: Clinical Measurements: Goal: Diagnostic test results will improve Outcome: Completed/Met Goal: Signs and symptoms of infection will decrease Outcome: Completed/Met   Problem: Respiratory: Goal: Ability to maintain adequate ventilation will improve Outcome: Completed/Met

## 2023-02-02 DIAGNOSIS — I96 Gangrene, not elsewhere classified: Secondary | ICD-10-CM | POA: Diagnosis not present

## 2023-02-02 NOTE — Progress Notes (Signed)
PT. Request to have suture removed from R BKA at a later time today

## 2023-02-02 NOTE — Progress Notes (Signed)
One suture removed left BKA, skin clean, dry and intact, tolerated well

## 2023-02-02 NOTE — Progress Notes (Signed)
Mobility Specialist Progress Note    02/02/23 1615  Mobility  Activity  (wheelchair mobility)  Level of Assistance Modified independent, requires aide device or extra time  Assistive Device Wheelchair  Distance Ambulated (ft) 500 ft (propulsion)  RLE Weight Bearing NWB  LLE Weight Bearing NWB  Activity Response Tolerated well  Mobility Referral Yes  $Mobility charge 1 Mobility   Pt received in room requesting to go outside. No complaints. Left in WC propelling around unit.   Hildred Alamin Mobility Specialist  Please Psychologist, sport and exercise or Rehab Office at [phone]

## 2023-02-02 NOTE — Plan of Care (Signed)
  Problem: Fluid Volume: Goal: Hemodynamic stability will improve Outcome: Progressing   Problem: Education: Goal: Knowledge of General Education information will improve Description: Including pain rating scale, medication(s)/side effects and non-pharmacologic comfort measures Outcome: Progressing   Problem: Health Behavior/Discharge Planning: Goal: Ability to manage health-related needs will improve Outcome: Progressing   

## 2023-02-02 NOTE — Progress Notes (Signed)
Physical Therapy Treatment Patient Details Name: [name] MRN: [id number] DOB: [name] 25, 1967 Today's Date: 02/02/2023   History of Present Illness Pt is a 57 y/o M who presented 12/17/22 with L ankle cellulitis and underwent L BKA on 1/10. PMH includes asthma, bioplar 1, depression, schizoaffective disorder, CVA, chronic CHF, homelessness, HTN, DM2, cocaine and cannabis use, and R AKA.    PT Comments    Continuing work on functional mobility and activity tolerance;  Session focused on exploring limits of weight shifting stability in WC, and [name] agreed to allow this PT to practice transferring into and out of his wc with the goal of pushing to the limits of where the antitippers will hold; During transfers and functional activity, [name] gave suggestions to this PT on how to move; He is still very nervous about tipping backwards, and it seems he is scared to try transfers without someone standing by in case WC tips; Plan to talk with [name], REp from [employer] to see about anti-tippers that are clsoer to the ground;   We discussed floor transfers as well, and this PT deomostrated an option for lowering self to the ground and getting back up into [facility]  Recommendations for follow up therapy are one component of a multi-disciplinary discharge planning process, led by the attending physician.  Recommendations may be updated based on patient status, additional functional criteria and insurance authorization.  Follow Up Recommendations  Skilled nursing-short term rehab (<3 hours/day) (Pursuing drug addiction rehab, must be modified independent) Can patient physically be transported by private vehicle:  (Yes, but would need assist with lateral scooting wc <>car)   Assistance Recommended at Discharge Intermittent Supervision/Assistance  Patient can return home with the following A little help with walking and/or transfers;A little help with bathing/dressing/bathroom;Assistance with cooking/housework;Assist  for transportation;Help with stairs or ramp for entrance   Equipment Recommendations  Other (comment) (pt declining L elevating legrest)    Recommendations for Other Services       Precautions / Restrictions Precautions Precautions: Fall Precaution Comments: R AKA, L BKA Restrictions RLE Weight Bearing: Non weight bearing LLE Weight Bearing: Non weight bearing     Mobility  Bed Mobility               General bed mobility comments: pt OOB in w/c upon arrival    Transfers Overall transfer level: Needs assistance Equipment used: None Transfers: Bed to chair/wheelchair/BSC            Lateral/Scoot Transfers: Min guard General transfer comment: Transferred wc to mat table to the Right, then mat table back to wc on his L; closeguard to steady wc and ultimately pt did not need physical assist with transfer to mat table; Needed min assist to steady WC with transfer mat table back to wc, towards his L side; his WC tipped backwards when he placed his hand on the WC back to boost self up and over teh wheel; with his weight going through the back teh chair tipped backwards, and he needed min assist to steady the wc    Ambulation/Gait                   Stairs             Wheelchair Mobility    Modified Rankin (Stroke Patients Only)       Balance  Cognition Arousal/Alertness: Awake/alert Behavior During Therapy: WFL for tasks assessed/performed Overall Cognitive Status: No family/caregiver present to determine baseline cognitive functioning                                 General Comments: Needs redirection back to tasks at times, but less so this session        Exercises      General Comments General comments (skin integrity, edema, etc.): lengthy discussion re: movements/transfers he must be able to do when he leaves the hospoital      Pertinent Vitals/Pain Pain  Assessment Pain Assessment: Faces Faces Pain Scale: Hurts a little bit Pain Location: general, grimace with movement Pain Descriptors / Indicators: Grimacing Pain Intervention(s): Monitored during session    Home Living                          Prior Function            PT Goals (current goals can now be found in the care plan section) Acute Rehab PT Goals Patient Stated Goal: Needs to be able to transfer independently PT Goal Formulation: With patient Time For Goal Achievement: 02/16/23 Potential to Achieve Goals: Good Progress towards PT goals: Progressing toward goals (will add floor transfer goal)    Frequency    Min 1X/week      PT Plan Current plan remains appropriate    Co-evaluation              AM-PAC PT "6 Clicks" Mobility   Outcome Measure  Help needed turning from your back to your side while in a flat bed without using bedrails?: None Help needed moving from lying on your back to sitting on the side of a flat bed without using bedrails?: None Help needed moving to and from a bed to a chair (including a wheelchair)?: None Help needed standing up from a chair using your arms (e.g., wheelchair or bedside chair)?: Total Help needed to walk in hospital room?: Total Help needed climbing 3-5 steps with a railing? : Total 6 Click Score: 15    End of Session   Activity Tolerance: Patient tolerated treatment well Patient left: Other (comment) (in wc) Nurse Communication: Mobility status PT Visit Diagnosis: Muscle weakness (generalized) (M62.81);Difficulty in walking, not elsewhere classified (R26.2);Other abnormalities of gait and mobility (R26.89)     Time: [id number] PT Time Calculation (min) (ACUTE ONLY): 41 min  Charges:  $Therapeutic Activity: 23-37 mins $Wheel Chair Management: 8-22 mins                     [name], PT  Acute Rehabilitation Services Office [phone]    [name] 02/02/2023, 5:42 PM

## 2023-02-02 NOTE — Progress Notes (Signed)
PROGRESS NOTE    [name]  [id number] DOB: 11-12-1966 DOA: 12/07/2022 PCP: Marliss Coots, NP    Brief Narrative:  57 year old male with past medical history of chronic combined CHF, CAD, hyperlipidemia, polyneuropathy, bipolar disorder, COPD, homelessness presented with lower extremity chronic venous stasis wound with concern for superimposed infection. He was seen by orthopedic surgery and was recommended for ongoing left BKA. 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 Currently awaiting skilled nursing facility placement.    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 Sharol Given.  He has completed 2 weeks of doxycycline with a stop date 2/9 -PT consulted, recommended skilled nursing facility; [facility] consulted but having issues with placement -Pain control with Neurontin, oxycodone and long-acting morphine   Hypertension -Continue Imdur, amlodipine, Lasix.  Blood pressure is stable   Chronic combined CHF -Appears well compensated, continue Lasix 40 mg p.o. daily.  Has been intermittently refusing labs, attempt to obtain labs tomorrow morning   COPD -Stable, but has been having intermittent shortness of breath, start Dulera.  No wheezing   Anxiety -Continue Atarax as needed   GERD -Continue PPI   Constipation -bowel regimen   Scheduled Meds:  amLODipine  10 mg Oral Daily   vitamin C  1,000 mg Oral Daily   collagenase   Topical Daily   docusate sodium  100 mg Oral Daily   folic acid  1 mg Oral Daily   furosemide  40 mg Oral Daily   gabapentin  900 mg Oral TID   isosorbide mononitrate  15 mg Oral Daily   melatonin  3 mg Oral QHS   mometasone-formoterol  2 puff Inhalation BID   morphine  30 mg Oral Q12H   multivitamin with minerals  1 tablet Oral Daily    pantoprazole  40 mg Oral Daily   thiamine  100 mg Oral Daily   Continuous Infusions:  magnesium sulfate bolus IVPB     PRN Meds:.albuterol, alum & mag hydroxide-simeth, benzocaine, bisacodyl, diphenhydrAMINE, guaiFENesin-dextromethorphan, hydrALAZINE, hydrOXYzine, labetalol, magnesium citrate, magnesium sulfate bolus IVPB, ondansetron, mouth rinse, oxyCODONE, phenol, polyethylene glycol, traMADol  DVT prophylaxis: SCD's Start: 12/10/22 1731    Code Status: Full Code  Disposition Plan:  Level of care: Med-Surg Status is: Inpatient Remains inpatient appropriate because: needs placement    Consultants:  ortho   Objective: Vitals:   02/01/23 1548 02/01/23 1548 02/01/23 2003 02/02/23 0706  BP: 137/83 137/83 133/80 (!) 139/115  Pulse: 74 80 80 89  Resp:   17   Temp: 98.3 F (36.8 C) 98.3 F (36.8 C) 98.2 F (36.8 C) (!) 97.5 F (36.4 C)  TempSrc: Oral Oral  Oral  SpO2: 100% 97% 96% 98%  Weight:      Height:       No intake or output data in the 24 hours ending 02/02/23 0839    Filed Weights   01/28/23 0709 01/29/23 0725 01/30/23 0736  Weight: 84.3 kg 84.4 kg 84.5 kg    Examination:  Constitutional: NAD Eyes: lids and conjunctivae normal, no scleral icterus ENMT: mmm Neck: normal, supple Respiratory: clear to auscultation bilaterally, no wheezing, no crackles.  Cardiovascular: Regular rate and rhythm, no murmurs / rubs / gallops.  Abdomen: soft, no distention, no tenderness. Bowel sounds positive.   Data Reviewed: I have personally reviewed following labs and  imaging studies  CBC: Recent Labs  Lab 01/28/23 0120  WBC 6.1  HGB 14.0  HCT 40.5  MCV 89.0  PLT 229      Basic Metabolic Panel: Recent Labs  Lab 01/28/23 0120  NA 138  K 3.4*  CL 105  CO2 21*  GLUCOSE 117*  BUN 18  CREATININE 0.96  CALCIUM 9.1      GFR: Estimated Creatinine Clearance: 88 mL/min (by C-G formula based on SCr of 0.96 mg/dL). Liver Function Tests: No results for  input(s): "AST", "ALT", "ALKPHOS", "BILITOT", "PROT", "ALBUMIN" in the last 168 hours. No results for input(s): "LIPASE", "AMYLASE" in the last 168 hours. No results for input(s): "AMMONIA" in the last 168 hours. Coagulation Profile: No results for input(s): "INR", "PROTIME" in the last 168 hours. Cardiac Enzymes: No results for input(s): "CKTOTAL", "CKMB", "CKMBINDEX", "TROPONINI" in the last 168 hours. BNP (last 3 results) No results for input(s): "PROBNP" in the last 8760 hours. HbA1C: No results for input(s): "HGBA1C" in the last 72 hours. CBG: No results for input(s): "GLUCAP" in the last 168 hours.  Lipid Profile: No results for input(s): "CHOL", "HDL", "LDLCALC", "TRIG", "CHOLHDL", "LDLDIRECT" in the last 72 hours. Thyroid Function Tests: No results for input(s): "TSH", "T4TOTAL", "FREET4", "T3FREE", "THYROIDAB" in the last 72 hours. Anemia Panel: No results for input(s): "VITAMINB12", "FOLATE", "FERRITIN", "TIBC", "IRON", "RETICCTPCT" in the last 72 hours. Sepsis Labs: No results for input(s): "PROCALCITON", "LATICACIDVEN" in the last 168 hours.  No results found for this or any previous visit (from the past 240 hour(s)).   Radiology Studies: No results found.  Scheduled Meds:  amLODipine  10 mg Oral Daily   vitamin C  1,000 mg Oral Daily   collagenase   Topical Daily   docusate sodium  100 mg Oral Daily   folic acid  1 mg Oral Daily   furosemide  40 mg Oral Daily   gabapentin  900 mg Oral TID   isosorbide mononitrate  15 mg Oral Daily   melatonin  3 mg Oral QHS   mometasone-formoterol  2 puff Inhalation BID   morphine  30 mg Oral Q12H   multivitamin with minerals  1 tablet Oral Daily   pantoprazole  40 mg Oral Daily   thiamine  100 mg Oral Daily   Continuous Infusions:  magnesium sulfate bolus IVPB       LOS: 57 days  Marzetta Board, MD  Between 7 am - 7 pm you can contact me via Amion (for emergencies) or Securechat (non urgent matters).  I am not  available 7 pm - 7 am, please contact night coverage MD/APP via Amion

## 2023-02-02 NOTE — TOC Progression Note (Addendum)
Transition of Care [facility]) - Progression Note    Patient Details  Name: [name] MRN: [id number] Date of Birth: 08-15-1966  [facility] CM/SW Contact  Joanne Chars, [name] Phone Number: 02/02/2023, 8:35 AM  Clinical Narrative:   Referral refaxed to [facility].  Fax failed.  1615: CSW called [facility], confirmed we have correct fax number: [phone].   Spoke with Alvester Chou,  emailed referral to [email or web address].  Return email from [place] confirming he received referral and forwarded it to admissions.     Expected Discharge Plan: [place] Barriers to Discharge: Continued Medical Work up, SNF Pending bed offer  Expected Discharge Plan and Services   Discharge Planning Services: CM Consult Post Acute Care Choice: [place] Living arrangements for the past 2 months: Homeless                                       Social Determinants of Health (SDOH) Interventions [place]: Food Insecurity Present (11/24/2022)  Housing: Medium Risk (12/07/2022)  Transportation Needs: Unmet Transportation Needs (12/07/2022)  Utilities: Not At Risk (12/07/2022)  Alcohol Screen: Medium Risk (06/24/2019)  Depression (PHQ2-9): Medium Risk (02/10/2021)  Tobacco Use: High Risk (12/11/2022)    Readmission Risk Interventions    12/09/2022    9:02 AM 12/01/2022    3:56 PM 08/18/2022    9:00 AM  Readmission Risk Prevention Plan  Transportation Screening Complete Complete Complete  Medication Review Press photographer) Complete Complete Complete  PCP or Specialist appointment within 3-5 days of discharge Complete Complete Complete  HRI or Home Care Consult Complete Complete Complete  SW Recovery Care/Counseling Consult Complete Complete Complete  Palliative Care Screening Not Applicable Not Applicable Not [place] Not Applicable Not Applicable Not Applicable  SNF Comments  n/a

## 2023-02-03 DIAGNOSIS — I96 Gangrene, not elsewhere classified: Secondary | ICD-10-CM | POA: Diagnosis not present

## 2023-02-03 NOTE — Progress Notes (Signed)
PROGRESS NOTE    [name]  [id number] DOB: 14-Jun-1966 DOA: 12/07/2022 PCP: Marliss Coots, NP    Brief Narrative:  57 year old male with past medical history of chronic combined CHF, CAD, hyperlipidemia, polyneuropathy, bipolar disorder, COPD, homelessness presented with lower extremity chronic venous stasis wound with concern for superimposed infection. He was seen by orthopedic surgery and was recommended for ongoing left BKA. 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 Currently awaiting skilled nursing facility placement.    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 Sharol Given.  He has completed 2 weeks of doxycycline with a stop date 2/9 -PT consulted, recommended skilled nursing facility; [facility] consulted but having issues with placement -Pain control with Neurontin, oxycodone and long-acting morphine   Hypertension -Continue Imdur, amlodipine, Lasix.  Blood pressure is stable   Chronic combined CHF -Appears well compensated, continue Lasix 40 mg p.o. daily.  Has been intermittently refusing labs, attempt to obtain labs tomorrow morning   COPD -Stable, but has been having intermittent shortness of breath, start Dulera.  No wheezing   Anxiety -Continue Atarax as needed   GERD -Continue PPI   Constipation -bowel regimen   Scheduled Meds:  amLODipine  10 mg Oral Daily   vitamin C  1,000 mg Oral Daily   collagenase   Topical Daily   docusate sodium  100 mg Oral Daily   folic acid  1 mg Oral Daily   furosemide  40 mg Oral Daily   gabapentin  900 mg Oral TID   isosorbide mononitrate  15 mg Oral Daily   melatonin  3 mg Oral QHS   mometasone-formoterol  2 puff Inhalation BID   morphine  30 mg Oral Q12H   multivitamin with minerals  1 tablet Oral Daily   pantoprazole  40 mg Oral Daily    thiamine  100 mg Oral Daily   Continuous Infusions:  magnesium sulfate bolus IVPB     PRN Meds:.albuterol, alum & mag hydroxide-simeth, benzocaine, bisacodyl, diphenhydrAMINE, guaiFENesin-dextromethorphan, hydrALAZINE, hydrOXYzine, labetalol, magnesium citrate, magnesium sulfate bolus IVPB, ondansetron, mouth rinse, oxyCODONE, phenol, polyethylene glycol, traMADol  DVT prophylaxis: SCD's Start: 12/10/22 1731    Code Status: Full Code  Disposition Plan:  Level of care: Med-Surg Status is: Inpatient Remains inpatient appropriate because: needs placement    Consultants:  ortho   Objective: Vitals:   02/02/23 0902 02/02/23 1435 02/02/23 2031 02/03/23 0714  BP:  132/86 137/83 (!) 150/91  Pulse:  90 77 90  Resp:   18   Temp:  (!) 97.5 F (36.4 C) 97.6 F (36.4 C)   TempSrc:  Oral Oral   SpO2: 97% 99% 100% 99%  Weight:      Height:       No intake or output data in the 24 hours ending 02/03/23 0822    Filed Weights   01/28/23 0709 01/29/23 0725 01/30/23 0736  Weight: 84.3 kg 84.4 kg 84.5 kg    Examination:  Constitutional: NAD Respiratory: CTA Cardiovascular: RRR  Data Reviewed: I have personally reviewed following labs and imaging studies  CBC: Recent Labs  Lab 01/28/23 0120  WBC 6.1  HGB 14.0  HCT 40.5  MCV 89.0  PLT 229      Basic Metabolic Panel: Recent Labs  Lab 01/28/23 0120  NA 138  K 3.4*  CL 105  CO2 21*  GLUCOSE 117*  BUN 18  CREATININE 0.96  CALCIUM 9.1      GFR: Estimated Creatinine Clearance: 88 mL/min (by C-G formula based on SCr of 0.96 mg/dL). Liver Function Tests: No results for input(s): "AST", "ALT", "ALKPHOS", "BILITOT", "PROT", "ALBUMIN" in the last 168 hours. No results for input(s): "LIPASE", "AMYLASE" in the last 168 hours. No results for input(s): "AMMONIA" in the last 168 hours. Coagulation Profile: No results for input(s): "INR", "PROTIME" in the last 168 hours. Cardiac Enzymes: No results for input(s):  "CKTOTAL", "CKMB", "CKMBINDEX", "TROPONINI" in the last 168 hours. BNP (last 3 results) No results for input(s): "PROBNP" in the last 8760 hours. HbA1C: No results for input(s): "HGBA1C" in the last 72 hours. CBG: No results for input(s): "GLUCAP" in the last 168 hours.  Lipid Profile: No results for input(s): "CHOL", "HDL", "LDLCALC", "TRIG", "CHOLHDL", "LDLDIRECT" in the last 72 hours. Thyroid Function Tests: No results for input(s): "TSH", "T4TOTAL", "FREET4", "T3FREE", "THYROIDAB" in the last 72 hours. Anemia Panel: No results for input(s): "VITAMINB12", "FOLATE", "FERRITIN", "TIBC", "IRON", "RETICCTPCT" in the last 72 hours. Sepsis Labs: No results for input(s): "PROCALCITON", "LATICACIDVEN" in the last 168 hours.  No results found for this or any previous visit (from the past 240 hour(s)).   Radiology Studies: No results found.  Scheduled Meds:  amLODipine  10 mg Oral Daily   vitamin C  1,000 mg Oral Daily   collagenase   Topical Daily   docusate sodium  100 mg Oral Daily   folic acid  1 mg Oral Daily   furosemide  40 mg Oral Daily   gabapentin  900 mg Oral TID   isosorbide mononitrate  15 mg Oral Daily   melatonin  3 mg Oral QHS   mometasone-formoterol  2 puff Inhalation BID   morphine  30 mg Oral Q12H   multivitamin with minerals  1 tablet Oral Daily   pantoprazole  40 mg Oral Daily   thiamine  100 mg Oral Daily   Continuous Infusions:  magnesium sulfate bolus IVPB       LOS: 58 days  Marzetta Board, MD  Between 7 am - 7 pm you can contact me via Amion (for emergencies) or Securechat (non urgent matters).  I am not available 7 pm - 7 am, please contact night coverage MD/APP via Amion

## 2023-02-04 DIAGNOSIS — L03116 Cellulitis of left lower limb: Secondary | ICD-10-CM | POA: Diagnosis not present

## 2023-02-04 DIAGNOSIS — I96 Gangrene, not elsewhere classified: Secondary | ICD-10-CM | POA: Diagnosis not present

## 2023-02-04 LAB — BASIC METABOLIC PANEL
Anion gap: 9 (ref 5–15)
BUN: 17 mg/dL (ref 6–20)
CO2: 24 mmol/L (ref 22–32)
Calcium: 9.3 mg/dL (ref 8.9–10.3)
Chloride: 106 mmol/L (ref 98–111)
Creatinine, Ser: 0.9 mg/dL (ref 0.61–1.24)
GFR, Estimated: >60 mL/min (ref >60)
Glucose, Bld: 92 mg/dL (ref 70–99)
Potassium: 3.9 mmol/L (ref 3.5–5.1)
Sodium: 139 mmol/L (ref 135–145)

## 2023-02-04 NOTE — TOC Progression Note (Signed)
Transition of Care [facility]) - Progression Note    Patient Details  Name: [name] MRN: [id number] Date of Birth: 03/21/1966  Transition of Care [facility]) CM/SW Contact  Joanne Chars, [name] Phone Number: 02/04/2023, 2:47 PM  Clinical Narrative:    Message left with Art Smith/[facility] asking if referral has been reviewed.  [phone].   Expected Discharge Plan: [place] Barriers to Discharge: Continued Medical Work up, SNF Pending bed offer  Expected Discharge Plan and Services   Discharge Planning Services: CM Consult Post Acute Care Choice: [place] Living arrangements for the past 2 months: Homeless                                       Social Determinants of Health (SDOH) Interventions [place]: Food Insecurity Present (11/24/2022)  Housing: Medium Risk (12/07/2022)  Transportation Needs: Unmet Transportation Needs (12/07/2022)  Utilities: Not At Risk (12/07/2022)  Alcohol Screen: Medium Risk (06/24/2019)  Depression (PHQ2-9): Medium Risk (02/10/2021)  Tobacco Use: High Risk (12/11/2022)    Readmission Risk Interventions    12/09/2022    9:02 AM 12/01/2022    3:56 PM 08/18/2022    9:00 AM  Readmission Risk Prevention Plan  Transportation Screening Complete Complete Complete  Medication Review Press photographer) Complete Complete Complete  PCP or Specialist appointment within 3-5 days of discharge Complete Complete Complete  HRI or Home Care Consult Complete Complete Complete  SW Recovery Care/Counseling Consult Complete Complete Complete  Palliative Care Screening Not Applicable Not Applicable Not [place] Not Applicable Not Applicable Not Applicable  SNF Comments  n/a

## 2023-02-04 NOTE — Progress Notes (Signed)
Physical Therapy Treatment Patient Details Name: [name] MRN: [id number] DOB: 11-17-1966 Today's Date: 02/04/2023   History of Present Illness Pt is a 57 y/o M who presented 12/17/22 with L ankle cellulitis and underwent L BKA on 1/10. PMH includes asthma, bioplar 1, depression, schizoaffective disorder, CVA, chronic CHF, homelessness, HTN, DM2, cocaine and cannabis use, and R AKA.    PT Comments    Continuing work on functional mobility and activity tolerance;  Session focused mostly on discussing acute PT goals and plans for next few sessions;  Overall the goal is complete modified independence with wc transfers and mobility;   Tarence needs to feel more confident in his wheelchair transfers; It is highly likely he has fallen before, and he becomes very anxious with any sideways play or anything motion that feels like his wc is tipping; He insists on someone being behind the wc to hold it when he lateral scoots; TOC obtained some anti-tippers recently, and while this PT pushed to the limit of backwards tilt (me in wc, not [place]), and it held -- I do believe that these anti-tippers allow for more backwards tilt than Keoki can tolerate; Planning to meet up with Erasmo Downer, [employer], tomorrow to get anti-tippers that provide more stability; While I hesitate to limit movement, in this case I think more stabiltiy will increase Donyea's confidence and help him towards not needing assist to steady the wc during transfers;   Can also consider working with a sliding board (which can also be placed under his wc cushion on the L to help with knee extension positioning in the wc), but I wonder if the sliding board will just be inefficient, and get in Wisdom's way;   James has stated the goal of getting LE prostheses; His residual limbs are healing, and we have started working on therex to prep for prostheses (he needs more encouragement to perform theses consistently); Provided [place] with literature on  amputations and prostheses; He is aware that an initial prosthesis fitting will need to happen at the prosthetist office; still, it's worth considering getting a Prosthetist Consult for pre-prosthesis education;  Ether would very much like a new wheelchair, and his current wheelchair is showing quite a lot of wear and tear; My understanding is that [facility] has tried to get a new wheelchair, but ultimately, we are unable to get him a wheelchair at this time; Elry tells me that he can't get a new wc because insurance states he has already received a new wc, but he really received a rollator RW and not a wc; Roque plans to call his contact at [facility] to discuss this;   Recommendations for follow up therapy are one component of a multi-disciplinary discharge planning process, led by the attending physician.  Recommendations may be updated based on patient status, additional functional criteria and insurance authorization.  Follow Up Recommendations  Skilled nursing-short term rehab (<3 hours/day) (TOC considering lots of options; drug rehab, halfway houses, etc) Can patient physically be transported by private vehicle:  (would need lateral scooting for car transfers; might consider sliding board)   Assistance Recommended at Discharge Intermittent Supervision/Assistance  Patient can return home with the following A little help with walking and/or transfers;A little help with bathing/dressing/bathroom;Assistance with cooking/housework;Assist for transportation;Help with stairs or ramp for entrance   Equipment Recommendations   (declines L elevating legrest)  WC anti-tippers that provide more stability   Recommendations for Other Services Other (comment) (Prosthetist consult)     Precautions / Restrictions  Precautions Precautions: Fall Precaution Comments: R AKA, L BKA Restrictions Weight Bearing Restrictions: Yes RLE Weight Bearing: Non weight bearing LLE Weight Bearing: Non weight bearing Other  Position/Activity Restrictions: L wound vac     Tax inspector propulsion: Both upper extremities Distance: managing around objects in room Wheelchair Assistance Details (indicate cue type and reason): Spends lots of time in the wc throughout the day  Modified Rankin (Stroke Patients Only)       Balance                                            Cognition Arousal/Alertness: Awake/alert Behavior During Therapy: WFL for tasks assessed/performed Overall Cognitive Status: No family/caregiver present to determine baseline cognitive functioning Area of Impairment: Safety/judgement, Problem solving                               General Comments: Needs redirection back to tasks at times, but less so this session        Exercises      General Comments        Pertinent Vitals/Pain Pain Assessment Pain Assessment: Faces Faces Pain Scale: No hurt Pain Intervention(s): Monitored during session    Home Living                          Prior Function            PT Goals (current goals can now be found in the care plan section) Acute Rehab PT Goals Patient Stated Goal: Needs to be able to transfer independently PT Goal Formulation: With patient Time For Goal Achievement: 02/16/23 Potential to Achieve Goals: Good Progress towards PT goals: Progressing toward goals    Frequency    Min 1X/week      PT Plan Current plan remains appropriate    Co-evaluation              AM-PAC PT "6 Clicks" Mobility   Outcome Measure  Help needed turning from your back to your side while in a flat bed without using bedrails?: None Help needed moving from lying on your back to sitting on the side of a flat bed without using bedrails?: None Help needed moving to and from a bed to a chair (including a wheelchair)?: A Little (scared of wc tipping) Help  needed standing up from a chair using your arms (e.g., wheelchair or bedside chair)?: Total Help needed to walk in hospital room?: Total Help needed climbing 3-5 steps with a railing? : Total 6 Click Score: 14    End of Session   Activity Tolerance: Patient tolerated treatment well Patient left: Other (comment) (managing independently in wc) Nurse Communication: Mobility status (and plan) PT Visit Diagnosis: Muscle weakness (generalized) (M62.81);Difficulty in walking, not elsewhere classified (R26.2);Other abnormalities of gait and mobility (R26.89)     Time: 1500-1520 PT Time Calculation (min) (ACUTE ONLY): 20 min  Charges:  $Self Care/Home Management: [place], [place] Office [phone]    [place]  Burman Foster 02/04/2023, 6:06 PM

## 2023-02-04 NOTE — Progress Notes (Addendum)
Occupational Therapy Treatment Patient Details Name: [name] MRN: [id number] DOB: 1966/01/27 Today's Date: 02/04/2023   History of present illness Pt is a 57 y/o M who presented 12/17/22 with L ankle cellulitis and underwent L BKA on 1/10. PMH includes asthma, bioplar 1, depression, schizoaffective disorder, CVA, chronic CHF, homelessness, HTN, DM2, cocaine and cannabis use, and R AKA.   OT comments  Pt progressing towards therapy goals. Patient has met 4/5 goals with the toilet transfer goal upgraded in order for pt to focus on completing a toilet transfer with a tank on the back of toilet as well as tankless toilet. Currently, pt is able to complete AP transfer onto regular height toilet while facing back of toilet once on toilet seat. Discussed working on toilet transfers if there is tank on the back which will prevent him from facing the back of the toilet. Pt verbalized transfer method he was shown by Mobility Tech when he gets back into bed which has been working great for him. Transfer technique is similar to AP transfer; just modified slightly. OT agreed this would be a great option. Next session: Locate regular size Drop arm BSC to place over pt's regular height toilet to allow for a level transfer surface.    Recommendations for follow up therapy are one component of a multi-disciplinary discharge planning process, led by the attending physician.  Recommendations may be updated based on patient status, additional functional criteria and insurance authorization.    Follow Up Recommendations  Skilled nursing-short term rehab (<3 hours/day)     Assistance Recommended at Discharge PRN  Patient can return home with the following  Help with stairs or ramp for entrance;Assist for transportation;A little help with walking and/or transfers   Equipment Recommendations  Other (comment) (TBD based on next venue)       Precautions / Restrictions Precautions Precautions: Fall Precaution  Comments: R AKA, L BKA Restrictions Weight Bearing Restrictions: Yes RLE Weight Bearing: Non weight bearing LLE Weight Bearing: Non weight bearing Other Position/Activity Restrictions: L wound vac              ADL either performed or assessed with clinical judgement   ADL     Upper Body Bathing: Modified independent;Sitting   Lower Body Bathing: Modified independent;Sitting/lateral leans     Toilet Transfer: Modified Independent;Regular Glass blower/designer Details (indicate cue type and reason): AP transfer to/from toilet and personal w/c Toileting- Clothing Manipulation and Hygiene: Modified independent;Sitting/lateral lean                 Cognition Arousal/Alertness: Awake/alert Behavior During Therapy: WFL for tasks assessed/performed Overall Cognitive Status: No family/caregiver present to determine baseline cognitive functioning Area of Impairment: Safety/judgement, Problem solving     Following Commands: Follows multi-step commands consistently Safety/Judgement: Decreased awareness of deficits, Decreased awareness of safety   Problem Solving: Slow processing                     Pertinent Vitals/ Pain       Pain Assessment Pain Assessment: Faces Faces Pain Scale: No hurt         Frequency  Min 1X/week        Progress Toward Goals  OT Goals(current goals can now be found in the care plan section)  Progress towards OT goals: Progressing toward goals;Goals met and updated - see care plan  Acute Rehab OT Goals Time For Goal Achievement: 02/18/23 Potential to Achieve Goals: Good  Plan Discharge plan remains  appropriate;Frequency remains appropriate       AM-PAC OT "6 Clicks" Daily Activity     Outcome Measure   Help from another person eating meals?: None Help from another person taking care of personal grooming?: None Help from another person toileting, which includes using toliet, bedpan, or urinal?: None Help from another person  bathing (including washing, rinsing, drying)?: None Help from another person to put on and taking off regular upper body clothing?: None Help from another person to put on and taking off regular lower body clothing?: None 6 Click Score: 24    End of Session Equipment Utilized During Treatment: Other (comment) (w/c)  OT Visit Diagnosis: Muscle weakness (generalized) (M62.81);Pain Pain - Right/Left: Left Pain - part of body: Leg   Activity Tolerance Patient tolerated treatment well   Patient Left in chair;with call bell/phone within reach;with family/visitor present;Other (comment) (in personal w/c)           Time: 1415-1440 OT Time Calculation (min): 25 min  Charges: OT General Charges $OT Visit: 1 Visit OT Treatments $Self Care/Home Management : 38-52 mins  Ailene Ravel, OTR/L,CBIS  Supplemental OT - [facility] and [facility] Secure Chat Preferred    Damiya Sandefur, [name] 02/04/2023, 4:21 PM

## 2023-02-04 NOTE — Plan of Care (Signed)
  Problem: Health Behavior/Discharge Planning: Goal: Ability to manage health-related needs will improve Outcome: Progressing   Problem: Pain Managment: Goal: General experience of comfort will improve Outcome: Progressing   

## 2023-02-04 NOTE — Progress Notes (Signed)
[facility] PROGRESS NOTE    Progress Note  [name]  [id number] DOB: 02-Apr-1966 DOA: 12/07/2022 PCP: Marliss Coots, NP     Brief Narrative:   [name] is an 57 y.o. 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 Has completed 2 weeks of Doxy.  Physical therapy evaluated the patient, patient is awaiting skilled nursing facility Continue Neurontin, oxycodone and long-acting narcotics for pain. Last hemoglobin A1c of 6.1 on 12/07/2020 for  Essential hypertension: Relatively stable, continue on the amlodipine and Lasix.  Chronic combined systolic and diastolic heart failure: Appears to be compensated continue Lasix, has been refusing labs intermittently. Basic metabolic panel has been sent he is agreed to have it checked. We had a talk this morning he just want be told what is being done in anticipation.  COPD: Stable continue inhalers.  Anxiety: Continue Atarax.  GERD: Continue PPI.  Constipation: Continue bowel regimen.  Schizoaffective disorder, bipolar type ([place]) Noted   DVT prophylaxis: lovenox Family Communication:none Status is: Inpatient Remains inpatient appropriate because: Awaiting placement.    Code Status:     Code Status Orders  (From admission, onward)           Start     Ordered   12/07/22 1711  Full code  Continuous       Question Answer Comment  By: Other   Comments: other      12/07/22 1712           Code Status History     Date Active Date Inactive  Code Status Order ID Comments User Context   11/24/2022 0745 12/03/2022 1902 Full Code [id number]  Reubin Milan, MD ED   11/03/2022 0253 11/03/2022 1937 Full Code [id number]  Larene Pickett, PA-C ED   08/17/2022 0142 10/09/2022 2035 Full Code [id number]  Clance Boll, MD ED   07/12/2022 1026 07/25/2022 2113 Full Code [id number]  Reubin Milan, MD ED   04/04/2022 1240 04/11/2022 1710 Full Code [id number]  Reubin Milan, MD ED   04/03/2022 1411 04/04/2022 1240 Full Code [id number]  Dorothyann Peng, PA-C ED   03/23/2022 2112 03/31/2022 1517 Full Code [id number]  Elodia Florence., MD Inpatient   02/26/2022 1812 03/07/2022 1908 Full Code [id number]  Jonnie Finner, DO Inpatient   01/29/2022 1515 02/05/2022 1533 Full Code [id number]  Orma Flaming, MD ED   12/03/2021 0841 12/09/2021 1457 Full Code [id number]  Vernelle Emerald, MD ED   11/24/2021 2315 11/25/2021 2004 Full Code [id number]  Muthersbaugh, Gwenlyn Perking ED   11/13/2021 1653 11/21/2021 1748 Full Code [id number]  Reubin Milan, MD Inpatient   11/02/2021 2000 11/04/2021 2331 Full Code [id number]  Varney Biles, MD ED   07/03/2021 1143 07/05/2021 0057 Full Code [id number]  Regan Lemming, MD ED   03/02/2021 1936 03/12/2021 1811 Full Code [id number]  Lenore Cordia, MD ED   02/10/2021 2345 02/13/2021 1958 Full Code [id number]  Larene Pickett, PA-C ED   02/09/2021 1953 02/10/2021 1536 Full Code [id number]  Arnaldo Natal, MD ED   12/17/2020 1516 02/03/2021 1650 Full Code [id number]  Gardiner Barefoot, NP ED   12/08/2020 1336 12/10/2020 1931 Full Code [id number]  Alcus Dad, MD ED   11/13/2020 2028 11/16/2020 1520 Full Code [id number]  Jonnie Finner, DO ED   10/26/2020 1039 10/31/2020 2157 Full Code [id number]  Harold Hedge, MD ED   10/15/2020 [phone] 10/18/2020 1810 Full Code [id number]  Chotiner, Yevonne Aline, MD Inpatient   10/02/2020 2247 10/07/2020 1809 Full Code [id number]  Vianne Bulls, MD ED   09/25/2020 1559 09/26/2020 2002 Full Code [id number]  Para Skeans, MD ED   09/09/2020 1251 09/11/2020 1546 Full Code [id number]  Dessa Phi, DO Inpatient   09/07/2020 1753 09/09/2020 1251 DNR [id number]  Jonnie Finner, DO Inpatient   07/21/2020 1700 07/23/2020 2001 Full Code [id number]  Mosetta Anis, MD ED   07/07/2020 1529 07/07/2020 2109 Full Code [id number]  Wyvonnia Dusky, MD ED   06/24/2019 0131 06/24/2019 1630 Full Code [id number]  Rozetta Nunnery, NP Inpatient   06/24/2019 0130 06/24/2019 0131 Full Code [id number]  Mordecai Maes, NP Inpatient   06/21/2019 0941 06/22/2019 2007 Full Code [id number]  Rory Percy, DO Inpatient   05/24/2019 1117 05/26/2019 1836 Full Code [id number]  Everrett Coombe, MD ED   04/18/2019 0149 04/19/2019 1628 DNR [id number]  Ina Homes, MD ED   04/04/2019 0555 04/06/2019 1413 Full Code [id number]  Lars Mage, MD ED   03/27/2019 2130 03/29/2019 1900 Full Code [id number]  Mosetta Anis, MD ED   03/04/2019 1600 03/09/2019 1222 Full Code [id number]  Ledell Noss, MD ED   02/13/2018 0439 02/14/2018 1333 Full Code [id number]  Ezequiel Essex, MD ED   12/26/2016 0547 12/29/2016 2021 Full Code [id number]  Clapacs, Madie Reno, MD Inpatient   11/27/2016 1541 11/28/2016 1319 Full Code [id number]  Dalia Heading, PA-C ED   10/29/2016 1415 10/31/2016 1150 Full Code [id number]  Davonna Belling, MD ED   09/06/2016 2230 09/07/2016 1350 Full Code [id number]  Orlie Dakin, MD ED   09/06/2016 0934 09/06/2016 1529 Full Code [id number]  Macarthur Critchley, MD ED   09/02/2016 0045 09/04/2016 2127 Full Code [id number]  Junius Creamer, NP ED   07/30/2016 1357 08/06/2016 2031 Full Code [id number]  Nanci Pina, [place] Inpatient   07/30/2016 0143 07/30/2016 1153 Full Code [id number]  Orpah Greek, MD ED   05/30/2016 1622 06/05/2016 1454 Full Code [id number]  Niel Hummer, NP Inpatient   05/29/2016 1635 05/30/2016 1622 Full Code [id number]  Deno Etienne, DO ED   12/02/2011 0245 12/12/2011 0720 Full Code [id number]  Carlisle Cater, [place] ED         IV Access:   Peripheral  IV   Procedures and diagnostic studies:   No results found.   Medical Consultants:   None.   Subjective:    [name] no complaints.  Objective:    Vitals:   02/03/23 0714 02/03/23 0845 02/03/23 1449 02/04/23 0020  BP: (!) 150/91  133/78 123/70  Pulse: 90  94 82  Resp:    18  Temp:   97.9 F (36.6 C) 98.1 F (36.7 C)  TempSrc:   Oral   SpO2: 99% 93% 97% 98%  Weight:      Height:       SpO2: 98 % O2 Flow Rate (L/min): 2 L/min   Intake/Output Summary (Last 24 hours) at 02/04/2023 0804 Last data filed at 02/04/2023 0500 Gross per 24 hour  Intake --  Output 850 ml  Net -850 ml   [employer]  01/28/23 0709 01/29/23 0725 01/30/23 0736  Weight: 84.3 kg 84.4 kg 84.5 kg    Exam: General exam: In no acute distress. Respiratory system: Good air movement and clear to auscultation. Cardiovascular system: S1 & S2 heard, RRR. No JVD. Gastrointestinal system: Abdomen is nondistended, soft and nontender.  Skin: No rashes, lesions or ulcers Psychiatry: Judgement and insight appear normal. Mood & affect appropriate.    Data Reviewed:    Labs: Basic Metabolic Panel: No results for input(s): "NA", "K", "CL", "CO2", "GLUCOSE", "BUN", "CREATININE", "CALCIUM", "MG", "PHOS" in the last 168 hours. GFR Estimated Creatinine Clearance: 88 mL/min (by C-G formula based on SCr of 0.96 mg/dL). Liver Function Tests: No results for input(s): "AST", "ALT", "ALKPHOS", "BILITOT", "PROT", "ALBUMIN" in the last 168 hours. No results for input(s): "LIPASE", "AMYLASE" in the last 168 hours. No results for input(s): "AMMONIA" in the last 168 hours. Coagulation profile No results for input(s): "INR", "PROTIME" in the last 168 hours. COVID-19 Labs  No results for input(s): "DDIMER", "FERRITIN", "LDH", "CRP" in the last 72 hours.  Lab Results  Component Value Date   SARSCOV2NAA NEGATIVE 11/23/2022   SARSCOV2NAA NEGATIVE 11/03/2022   [place] NEGATIVE 07/12/2022    [place] NEGATIVE 04/03/2022    CBC: No results for input(s): "WBC", "NEUTROABS", "HGB", "HCT", "MCV", "PLT" in the last 168 hours. Cardiac Enzymes: No results for input(s): "CKTOTAL", "CKMB", "CKMBINDEX", "TROPONINI" in the last 168 hours. BNP (last 3 results) No results for input(s): "PROBNP" in the last 8760 hours. CBG: No results for input(s): "GLUCAP" in the last 168 hours. D-Dimer: No results for input(s): "DDIMER" in the last 72 hours. Hgb A1c: No results for input(s): "HGBA1C" in the last 72 hours. Lipid Profile: No results for input(s): "CHOL", "HDL", "LDLCALC", "TRIG", "CHOLHDL", "LDLDIRECT" in the last 72 hours. Thyroid function studies: No results for input(s): "TSH", "T4TOTAL", "T3FREE", "THYROIDAB" in the last 72 hours.  Invalid input(s): "FREET3" Anemia work up: No results for input(s): "VITAMINB12", "FOLATE", "FERRITIN", "TIBC", "IRON", "RETICCTPCT" in the last 72 hours. Sepsis Labs: No results for input(s): "PROCALCITON", "WBC", "LATICACIDVEN" in the last 168 hours. Microbiology No results found for this or any previous visit (from the past 240 hour(s)).   Medications:    amLODipine  10 mg Oral Daily   vitamin C  1,000 mg Oral Daily   collagenase   Topical Daily   docusate sodium  100 mg Oral Daily   folic acid  1 mg Oral Daily   furosemide  40 mg Oral Daily   gabapentin  900 mg Oral TID   isosorbide mononitrate  15 mg Oral Daily   melatonin  3 mg Oral QHS   mometasone-formoterol  2 puff Inhalation BID   morphine  30 mg Oral Q12H   multivitamin with minerals  1 tablet Oral Daily   pantoprazole  40 mg Oral Daily   thiamine  100 mg Oral Daily   Continuous Infusions:  magnesium sulfate bolus IVPB        LOS: 59 days   Charlynne Cousins  [facility]  02/04/2023, 8:04 AM

## 2023-02-05 DIAGNOSIS — L03116 Cellulitis of left lower limb: Secondary | ICD-10-CM | POA: Diagnosis not present

## 2023-02-05 DIAGNOSIS — I96 Gangrene, not elsewhere classified: Secondary | ICD-10-CM | POA: Diagnosis not present

## 2023-02-05 NOTE — Progress Notes (Signed)
Patient refused vital signs last night. Agreed to vitals this morning

## 2023-02-05 NOTE — Progress Notes (Signed)
[facility] PROGRESS NOTE    Progress Note  [name]  [id number] DOB: [birth year] DOA: 12/07/2022 PCP: [name] Coots, NP     Brief Narrative:   [name] is an 57 y.o. 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 Has completed 2 weeks of Doxy.  Physical therapy evaluated the patient, patient is awaiting skilled nursing facility Continue Neurontin, oxycodone and long-acting narcotics for pain. Last hemoglobin A1c of 6.1 on 12/07/2020 .  Essential hypertension: Relatively stable, continue on the amlodipine and Lasix.  Chronic combined systolic and diastolic heart failure: Appears to be compensated continue Lasix, has been refusing labs intermittently. Potassium seems to be stable. He does need to be notified of when labs are can to be drawn and why.  COPD: Stable continue inhalers.  Anxiety: Continue Atarax.  GERD: Continue PPI.  Constipation: Continue bowel regimen.  Schizoaffective disorder, bipolar type ([place]) Noted   DVT prophylaxis: lovenox Family Communication:none Status is: Inpatient Remains inpatient appropriate because: Awaiting placement.    Code Status:     Code Status Orders  (From admission, onward)           Start     Ordered   12/07/22 1711  Full code  Continuous       Question Answer Comment  By: Other   Comments: other      12/07/22 1712           Code Status History     Date Active Date Inactive Code Status Order ID Comments User Context   11/24/2022  0745 12/03/2022 1902 Full Code [id number]  Reubin Milan, MD ED   11/03/2022 0253 11/03/2022 1937 Full Code [id number]  Larene Pickett, PA-C ED   08/17/2022 0142 10/09/2022 2035 Full Code [id number]  Clance Boll, MD ED   07/12/2022 1026 07/25/2022 2113 Full Code [id number]  Reubin Milan, MD ED   04/04/2022 1240 04/11/2022 1710 Full Code [id number]  Reubin Milan, MD ED   04/03/2022 1411 04/04/2022 1240 Full Code [id number]  Dorothyann Peng, PA-C ED   03/23/2022 2112 03/31/2022 1517 Full Code [id number]  Elodia Florence., MD Inpatient   02/26/2022 1812 03/07/2022 1908 Full Code [id number]  Jonnie Finner, DO Inpatient   01/29/2022 1515 02/05/2022 1533 Full Code [id number]  Orma Flaming, MD ED   12/03/2021 0841 12/09/2021 1457 Full Code [id number]  Vernelle Emerald, MD ED   11/24/2021 2315 11/25/2021 2004 Full Code [id number]  Muthersbaugh, Gwenlyn Perking ED   11/13/2021 1653 11/21/2021 1748 Full Code [id number]  Reubin Milan, MD Inpatient   11/02/2021 2000 11/04/2021 2331 Full Code [id number]  Varney Biles, MD ED   07/03/2021 1143 07/05/2021 0057 Full Code [id number]  Regan Lemming, MD ED   03/02/2021 1936 03/12/2021 1811 Full Code [id number]  Lenore Cordia, MD ED   02/10/2021 2345 02/13/2021 1958 Full Code [id number]  Larene Pickett, PA-C ED   02/09/2021 1953 02/10/2021 1536 Full Code [id number]  Arnaldo Natal, MD ED   12/17/2020 1516 02/03/2021 1650 Full Code [id number]  Gardiner Barefoot, NP ED   12/08/2020 1336 12/10/2020 1931  Full Code [id number]  Alcus Dad, MD ED   11/13/2020 2028 11/16/2020 1520 Full Code [id number]  Jonnie Finner, DO ED   10/26/2020 1039 10/31/2020 2157 Full Code [id number]  Harold Hedge, MD ED   10/15/2020 [phone] 10/18/2020 1810 Full Code [id number]  Chotiner, Yevonne Aline, MD Inpatient   10/02/2020 2247 10/07/2020 1809 Full Code [id number]  Vianne Bulls, MD ED   09/25/2020 1559 09/26/2020 2002 Full Code [id number]  Para Skeans, MD ED   09/09/2020 1251 09/11/2020 1546 Full  Code [id number]  Dessa Phi, DO Inpatient   09/07/2020 1753 09/09/2020 1251 DNR [id number]  Jonnie Finner, DO Inpatient   07/21/2020 1700 07/23/2020 2001 Full Code [id number]  Mosetta Anis, MD ED   07/07/2020 1529 07/07/2020 2109 Full Code [id number]  Wyvonnia Dusky, MD ED   06/24/2019 0131 06/24/2019 1630 Full Code [id number]  Rozetta Nunnery, NP Inpatient   06/24/2019 0130 06/24/2019 0131 Full Code [id number]  Mordecai Maes, NP Inpatient   06/21/2019 0941 06/22/2019 2007 Full Code [id number]  Rory Percy, DO Inpatient   05/24/2019 1117 05/26/2019 1836 Full Code [id number]  Everrett Coombe, MD ED   04/18/2019 0149 04/19/2019 1628 DNR [id number]  Ina Homes, MD ED   04/04/2019 0555 04/06/2019 1413 Full Code [id number]  Lars Mage, MD ED   03/27/2019 2130 03/29/2019 1900 Full Code [id number]  Mosetta Anis, MD ED   03/04/2019 1600 03/09/2019 1222 Full Code [id number]  Ledell Noss, MD ED   02/13/2018 0439 02/14/2018 1333 Full Code [id number]  Ezequiel Essex, MD ED   12/26/2016 0547 12/29/2016 2021 Full Code [id number]  Clapacs, Madie Reno, MD Inpatient   11/27/2016 1541 11/28/2016 1319 Full Code [id number]  Dalia Heading, PA-C ED   10/29/2016 1415 10/31/2016 1150 Full Code [id number]  Davonna Belling, MD ED   09/06/2016 2230 09/07/2016 1350 Full Code [id number]  Orlie Dakin, MD ED   09/06/2016 0934 09/06/2016 1529 Full Code [id number]  Macarthur Critchley, MD ED   09/02/2016 0045 09/04/2016 2127 Full Code [id number]  Junius Creamer, NP ED   07/30/2016 1357 08/06/2016 2031 Full Code [id number]  Nanci Pina, [place] Inpatient   07/30/2016 0143 07/30/2016 1153 Full Code [id number]  Orpah Greek, MD ED   05/30/2016 1622 06/05/2016 1454 Full Code [id number]  Niel Hummer, NP Inpatient   05/29/2016 1635 05/30/2016 1622 Full Code [id number]  Deno Etienne, DO ED   12/02/2011 0245 12/12/2011 0720 Full Code [id number]  Carlisle Cater, [place] ED         IV Access:   Peripheral IV   Procedures and diagnostic studies:   No results  found.   Medical Consultants:   None.   Subjective:    [name] no complaints.  Objective:    Vitals:   02/04/23 2025 02/05/23 0337 02/05/23 0500 02/05/23 0738  BP:  127/87    Pulse:  70    Resp:  18  18  Temp:  97.8 F (36.6 C)  97.9 F (36.6 C)  TempSrc:  Oral  Oral  SpO2: 98% 98%    Weight:   88 kg   Height:       SpO2: 98 % O2 Flow Rate (L/min): 2 L/min   Intake/Output Summary (Last 24 hours) at 02/05/2023 0759 Last data filed at 02/05/2023 0500 Gross per 24 hour  Intake 1200 ml  Output 1200 ml  Net 0 ml    Filed Weights   01/30/23 0736 02/04/23 0804 02/05/23 0500  Weight:  84.5 kg 85.1 kg 88 kg    Exam: General exam: In no acute distress. Respiratory system: Good air movement and clear to auscultation. Cardiovascular system: S1 & S2 heard, RRR. No JVD. Gastrointestinal system: Abdomen is nondistended, soft and nontender.  Skin: No rashes, lesions or ulcers Psychiatry: Judgement and insight appear normal. Mood & affect appropriate.    Data Reviewed:    Labs: Basic Metabolic Panel: Recent Labs  Lab 02/04/23 0820  NA 139  K 3.9  CL 106  CO2 24  GLUCOSE 92  BUN 17  CREATININE 0.90  CALCIUM 9.3   GFR Estimated Creatinine Clearance: 103 mL/min (by C-G formula based on SCr of 0.9 mg/dL). Liver Function Tests: No results for input(s): "AST", "ALT", "ALKPHOS", "BILITOT", "PROT", "ALBUMIN" in the last 168 hours. No results for input(s): "LIPASE", "AMYLASE" in the last 168 hours. No results for input(s): "AMMONIA" in the last 168 hours. Coagulation profile No results for input(s): "INR", "PROTIME" in the last 168 hours. COVID-19 Labs  No results for input(s): "DDIMER", "FERRITIN", "LDH", "CRP" in the last 72 hours.  Lab Results  Component Value Date   SARSCOV2NAA NEGATIVE 11/23/2022   SARSCOV2NAA NEGATIVE 11/03/2022   [place] NEGATIVE 07/12/2022   [place] NEGATIVE 04/03/2022    CBC: No results for input(s): "WBC",  "NEUTROABS", "HGB", "HCT", "MCV", "PLT" in the last 168 hours. Cardiac Enzymes: No results for input(s): "CKTOTAL", "CKMB", "CKMBINDEX", "TROPONINI" in the last 168 hours. BNP (last 3 results) No results for input(s): "PROBNP" in the last 8760 hours. CBG: No results for input(s): "GLUCAP" in the last 168 hours. D-Dimer: No results for input(s): "DDIMER" in the last 72 hours. Hgb A1c: No results for input(s): "HGBA1C" in the last 72 hours. Lipid Profile: No results for input(s): "CHOL", "HDL", "LDLCALC", "TRIG", "CHOLHDL", "LDLDIRECT" in the last 72 hours. Thyroid function studies: No results for input(s): "TSH", "T4TOTAL", "T3FREE", "THYROIDAB" in the last 72 hours.  Invalid input(s): "FREET3" Anemia work up: No results for input(s): "VITAMINB12", "FOLATE", "FERRITIN", "TIBC", "IRON", "RETICCTPCT" in the last 72 hours. Sepsis Labs: No results for input(s): "PROCALCITON", "WBC", "LATICACIDVEN" in the last 168 hours. Microbiology No results found for this or any previous visit (from the past 240 hour(s)).   Medications:    amLODipine  10 mg Oral Daily   vitamin C  1,000 mg Oral Daily   collagenase   Topical Daily   docusate sodium  100 mg Oral Daily   folic acid  1 mg Oral Daily   furosemide  40 mg Oral Daily   gabapentin  900 mg Oral TID   isosorbide mononitrate  15 mg Oral Daily   melatonin  3 mg Oral QHS   mometasone-formoterol  2 puff Inhalation BID   morphine  30 mg Oral Q12H   multivitamin with minerals  1 tablet Oral Daily   pantoprazole  40 mg Oral Daily   thiamine  100 mg Oral Daily   Continuous Infusions:  magnesium sulfate bolus IVPB        LOS: 60 days   Charlynne Cousins  [facility]  02/05/2023, 7:59 AM

## 2023-02-05 NOTE — Progress Notes (Signed)
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 PMH includes asthma, bioplar 1, depression, schizoaffective disorder, CVA, chronic CHF, homelessness, HTN, DM2, cocaine and cannabis use, and R AKA.    PT Comments    Pt progressing towards physical therapy goals. Focus of session was testing out new (longer) anti-tippers on wheelchair. Pt was instructed in self tipping and by end of session able to easily tilt himself back to prop onto anti-tippers. Pt demonstrated the ability to transition to/from mat table via lateral scoot with supervision for safety. Pt did not require assist to hold wheelchair steady. Pt reports he has spoken to his Medicaid rep and that they need a prescription from [facility] for a new wheelchair for Medicaid to be able to provide this for him. Will continue to follow and progress as able per POC.      Recommendations for follow up therapy are one component of a multi-disciplinary discharge planning process, led by the attending physician.  Recommendations may be updated based on patient status, additional functional criteria and insurance authorization.  Follow Up Recommendations  Skilled nursing-short term rehab (<3 hours/day) (TOC considering lots of options; drug rehab, halfway houses, etc) Can patient physically be transported by private vehicle: No   Assistance Recommended at Discharge Intermittent Supervision/Assistance  Patient can return home with the following A little help with walking and/or transfers;A little help with bathing/dressing/bathroom;Assistance with cooking/housework;Assist for transportation;Help with stairs or ramp for entrance   Equipment Recommendations  Other (comment) (New wheelchair)    Recommendations for Other Services Other (comment) (Prosthetist  consult)     Precautions / Restrictions Precautions Precautions: Fall Precaution Comments: R AKA, L BKA Restrictions Weight Bearing Restrictions: Yes RLE Weight Bearing: Non weight bearing LLE Weight Bearing: Non weight bearing     Mobility  Bed Mobility               General bed mobility comments: Pt was received sitting up in wheelchair    Transfers Overall transfer level: Needs assistance Equipment used: None Transfers: Bed to chair/wheelchair/BSC            Lateral/Scoot Transfers: Supervision General transfer comment: Transferred wc to mat table to the Right, then mat table back to wc on his L. Supervision provided but able to complete without hands on w/c for steadying. Pt more successful with L arm rest in place for leverage.    Ambulation/Gait                   Stairs             Wheelchair Mobility    Modified Rankin (Stroke Patients Only)       Balance Overall balance assessment: Needs assistance Sitting-balance support: Feet unsupported, No upper extremity supported Sitting balance-Leahy Scale: Good                                      Cognition Arousal/Alertness: Awake/alert Behavior During Therapy: WFL for tasks assessed/performed Overall Cognitive Status: No family/caregiver present to determine baseline cognitive functioning  Exercises Other Exercises Other Exercises: New anti-tippers placed this date which are ~0.5" longer than what he had. Therapists tilted pt back onto the anti-tippers for pt to get feel for it. Pt was instructed in self-tilting and practiced several times. Increased confidence with tilting and appears more confident in safety of anti-tippers.    General Comments        Pertinent Vitals/Pain Pain Assessment Pain Assessment: No/denies pain Pain Intervention(s): Monitored during session    Home Living                           Prior Function            PT Goals (current goals can now be found in the care plan section) Acute Rehab PT Goals Patient Stated Goal: Needs to be able to transfer independently PT Goal Formulation: With patient Time For Goal Achievement: 02/16/23 Potential to Achieve Goals: Good Progress towards PT goals: Progressing toward goals    Frequency    Min 1X/week      PT Plan Current plan remains appropriate    Co-evaluation              AM-PAC PT "6 Clicks" Mobility   Outcome Measure  Help needed turning from your back to your side while in a flat bed without using bedrails?: None Help needed moving from lying on your back to sitting on the side of a flat bed without using bedrails?: None Help needed moving to and from a bed to a chair (including a wheelchair)?: A Little Help needed standing up from a chair using your arms (e.g., wheelchair or bedside chair)?: Total Help needed to walk in hospital room?: Total Help needed climbing 3-5 steps with a railing? : Total 6 Click Score: 14    End of Session   Activity Tolerance: Patient tolerated treatment well Patient left: Other (comment) (In wheelchair cruising halls) Nurse Communication: Mobility status PT Visit Diagnosis: Muscle weakness (generalized) (M62.81);Difficulty in walking, not elsewhere classified (R26.2);Other abnormalities of gait and mobility (R26.89)     Time: 1400-1440 PT Time Calculation (min) (ACUTE ONLY): 40 min  Charges:  $Therapeutic Activity: 23-37 mins $Self Care/Home Management: 8-22                     [name], PT, DPT Acute Rehabilitation Services Secure Chat Preferred Office: [phone]    [name] 02/05/2023, 3:42 PM

## 2023-02-05 NOTE — Plan of Care (Signed)
  Problem: Education: Goal: Knowledge of General Education information will improve Description: Including pain rating scale, medication(s)/side effects and non-pharmacologic comfort measures Outcome: Progressing   Problem: Pain Managment: Goal: General experience of comfort will improve Outcome: Progressing   

## 2023-02-06 DIAGNOSIS — L03116 Cellulitis of left lower limb: Secondary | ICD-10-CM | POA: Diagnosis not present

## 2023-02-06 DIAGNOSIS — I96 Gangrene, not elsewhere classified: Secondary | ICD-10-CM | POA: Diagnosis not present

## 2023-02-06 NOTE — Progress Notes (Signed)
CSW attempted to reach [place] at [facility] without success - a voicemail was left requesting a return call.  Madilyn Fireman, MSW, [name] Transitions of Care  Clinical Social Worker II [phone]

## 2023-02-06 NOTE — Progress Notes (Addendum)
[facility] PROGRESS NOTE    Progress Note  [name]  [id number] DOB: 11/15/1966 DOA: 12/07/2022 PCP: [name] Coots, NP     Brief Narrative:   [name] is an 57 y.o. 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 Has completed 2 weeks of Doxy.  Physical therapy evaluated the patient, patient is awaiting skilled nursing facility. Continue Neurontin, oxycodone and long-acting narcotics for pain. Last hemoglobin A1c of 6.1 on 12/07/2020 . Awaiting skilled nursing facility placement.  Essential hypertension: Relatively stable, continue on the amlodipine and Lasix.  Chronic combined systolic and diastolic heart failure: Appears to be compensated continue Lasix, has been refusing labs intermittently. Potassium seems to be stable. He does need to be notified of when labs are can to be drawn and why.  COPD: Stable continue inhalers.  Anxiety: Continue Atarax.  GERD: Continue PPI.  Constipation: Continue bowel regimen.  Schizoaffective disorder, bipolar type ([place]) Noted   DVT prophylaxis: lovenox Family Communication:none Status is: Inpatient Remains inpatient appropriate because: Awaiting placement.    Code Status:     Code Status Orders  (From admission, onward)           Start     Ordered   12/07/22 1711  Full code  Continuous       Question Answer Comment  By: Other   Comments: other      12/07/22 1712           Code Status History     Date Active Date  Inactive Code Status Order ID Comments User Context   11/24/2022 0745 12/03/2022 1902 Full Code [id number]  Reubin Milan, MD ED   11/03/2022 0253 11/03/2022 1937 Full Code [id number]  Larene Pickett, PA-C ED   08/17/2022 0142 10/09/2022 2035 Full Code [id number]  Clance Boll, MD ED   07/12/2022 1026 07/25/2022 2113 Full Code [id number]  Reubin Milan, MD ED   04/04/2022 1240 04/11/2022 1710 Full Code [id number]  Reubin Milan, MD ED   04/03/2022 1411 04/04/2022 1240 Full Code [id number]  Dorothyann Peng, PA-C ED   03/23/2022 2112 03/31/2022 1517 Full Code [id number]  Elodia Florence., MD Inpatient   02/26/2022 1812 03/07/2022 1908 Full Code [id number]  Jonnie Finner, DO Inpatient   01/29/2022 1515 02/05/2022 1533 Full Code [id number]  Orma Flaming, MD ED   12/03/2021 0841 12/09/2021 1457 Full Code [id number]  Vernelle Emerald, MD ED   11/24/2021 2315 11/25/2021 2004 Full Code [id number]  Muthersbaugh, Gwenlyn Perking ED   11/13/2021 1653 11/21/2021 1748 Full Code [id number]  Reubin Milan, MD Inpatient   11/02/2021 2000 11/04/2021 2331 Full Code [id number]  Varney Biles, MD ED   07/03/2021 1143 07/05/2021 0057 Full Code [id number]  Regan Lemming, MD ED   03/02/2021 1936 03/12/2021 1811 Full Code [id number]  Lenore Cordia, MD ED   02/10/2021 2345 02/13/2021 1958 Full Code [id number]  Larene Pickett, PA-C ED   02/09/2021 1953 02/10/2021 1536 Full Code [id number]  Arnaldo Natal, MD ED   12/17/2020 1516 02/03/2021 1650 Full Code [id number]  Kirby-Graham, Karsten Fells,  NP ED   12/08/2020 1336 12/10/2020 1931 Full Code [id number]  Alcus Dad, MD ED   11/13/2020 2028 11/16/2020 1520 Full Code [id number]  Jonnie Finner, DO ED   10/26/2020 1039 10/31/2020 2157 Full Code [id number]  Harold Hedge, MD ED   10/15/2020 [phone] 10/18/2020 1810 Full Code [id number]  Chotiner, Yevonne Aline, MD Inpatient   10/02/2020 2247 10/07/2020 1809 Full Code [id number]  Vianne Bulls, MD ED   09/25/2020 1559 09/26/2020 2002 Full Code [id number]   Para Skeans, MD ED   09/09/2020 1251 09/11/2020 1546 Full Code [id number]  Dessa Phi, DO Inpatient   09/07/2020 1753 09/09/2020 1251 DNR [id number]  Jonnie Finner, DO Inpatient   07/21/2020 1700 07/23/2020 2001 Full Code [id number]  Mosetta Anis, MD ED   07/07/2020 1529 07/07/2020 2109 Full Code [id number]  Wyvonnia Dusky, MD ED   06/24/2019 0131 06/24/2019 1630 Full Code [id number]  Rozetta Nunnery, NP Inpatient   06/24/2019 0130 06/24/2019 0131 Full Code [id number]  Mordecai Maes, NP Inpatient   06/21/2019 0941 06/22/2019 2007 Full Code [id number]  Rory Percy, DO Inpatient   05/24/2019 1117 05/26/2019 1836 Full Code [id number]  Everrett Coombe, MD ED   04/18/2019 0149 04/19/2019 1628 DNR [id number]  Ina Homes, MD ED   04/04/2019 0555 04/06/2019 1413 Full Code [id number]  Lars Mage, MD ED   03/27/2019 2130 03/29/2019 1900 Full Code [id number]  Mosetta Anis, MD ED   03/04/2019 1600 03/09/2019 1222 Full Code [id number]  Ledell Noss, MD ED   02/13/2018 0439 02/14/2018 1333 Full Code [id number]  Ezequiel Essex, MD ED   12/26/2016 0547 12/29/2016 2021 Full Code [id number]  Clapacs, Madie Reno, MD Inpatient   11/27/2016 1541 11/28/2016 1319 Full Code [id number]  Dalia Heading, PA-C ED   10/29/2016 1415 10/31/2016 1150 Full Code [id number]  Davonna Belling, MD ED   09/06/2016 2230 09/07/2016 1350 Full Code [id number]  Orlie Dakin, MD ED   09/06/2016 0934 09/06/2016 1529 Full Code [id number]  Macarthur Critchley, MD ED   09/02/2016 0045 09/04/2016 2127 Full Code [id number]  Junius Creamer, NP ED   07/30/2016 1357 08/06/2016 2031 Full Code [id number]  Nanci Pina, [place] Inpatient   07/30/2016 0143 07/30/2016 1153 Full Code [id number]  Orpah Greek, MD ED   05/30/2016 1622 06/05/2016 1454 Full Code [id number]  Niel Hummer, NP Inpatient   05/29/2016 1635 05/30/2016 1622 Full Code [id number]  Deno Etienne, DO ED   12/02/2011 0245 12/12/2011 0720 Full Code [id number]  Carlisle Cater, [place] ED         IV Access:    Peripheral IV   Procedures and diagnostic studies:   No results found.   Medical Consultants:   None.   Subjective:    [name] no complaints.  Objective:    Vitals:   02/05/23 0738 02/06/23 0407 02/06/23 0510 02/06/23 0738  BP:   (!) 137/95 131/86  Pulse:   72 68  Resp: '18  16 18  '$ Temp: 97.9 F (36.6 C)  98.3 F (36.8 C) 97.9 F (36.6 C)  TempSrc: Oral  Oral Oral  SpO2:   97% 98%  Weight:  88.4 kg    Height:       SpO2: 98 % O2 Flow Rate (L/min): 2 L/min   Intake/Output Summary (Last 24 hours) at 02/06/2023 0810 Last data filed at 02/05/2023 2123 Gross per 24 hour  Intake 237 ml  Output 375 ml  Net -138 ml  Filed Weights   02/04/23 0804 02/05/23 0500 02/06/23 0407  Weight: 85.1 kg 88 kg 88.4 kg    Exam: General exam: In no acute distress. Respiratory system: Good air movement and clear to auscultation. Cardiovascular system: S1 & S2 heard, RRR. No JVD. Gastrointestinal system: Abdomen is nondistended, soft and nontender.  Skin: No rashes, lesions or ulcers Psychiatry: Judgement and insight appear normal. Mood & affect appropriate.    Data Reviewed:    Labs: Basic Metabolic Panel: Recent Labs  Lab 02/04/23 0820  NA 139  K 3.9  CL 106  CO2 24  GLUCOSE 92  BUN 17  CREATININE 0.90  CALCIUM 9.3    GFR Estimated Creatinine Clearance: 103.3 mL/min (by C-G formula based on SCr of 0.9 mg/dL). Liver Function Tests: No results for input(s): "AST", "ALT", "ALKPHOS", "BILITOT", "PROT", "ALBUMIN" in the last 168 hours. No results for input(s): "LIPASE", "AMYLASE" in the last 168 hours. No results for input(s): "AMMONIA" in the last 168 hours. Coagulation profile No results for input(s): "INR", "PROTIME" in the last 168 hours. COVID-19 Labs  No results for input(s): "DDIMER", "FERRITIN", "LDH", "CRP" in the last 72 hours.  Lab Results  Component Value Date   SARSCOV2NAA NEGATIVE 11/23/2022   SARSCOV2NAA NEGATIVE 11/03/2022    [place] NEGATIVE 07/12/2022   [place] NEGATIVE 04/03/2022    CBC: No results for input(s): "WBC", "NEUTROABS", "HGB", "HCT", "MCV", "PLT" in the last 168 hours. Cardiac Enzymes: No results for input(s): "CKTOTAL", "CKMB", "CKMBINDEX", "TROPONINI" in the last 168 hours. BNP (last 3 results) No results for input(s): "PROBNP" in the last 8760 hours. CBG: No results for input(s): "GLUCAP" in the last 168 hours. D-Dimer: No results for input(s): "DDIMER" in the last 72 hours. Hgb A1c: No results for input(s): "HGBA1C" in the last 72 hours. Lipid Profile: No results for input(s): "CHOL", "HDL", "LDLCALC", "TRIG", "CHOLHDL", "LDLDIRECT" in the last 72 hours. Thyroid function studies: No results for input(s): "TSH", "T4TOTAL", "T3FREE", "THYROIDAB" in the last 72 hours.  Invalid input(s): "FREET3" Anemia work up: No results for input(s): "VITAMINB12", "FOLATE", "FERRITIN", "TIBC", "IRON", "RETICCTPCT" in the last 72 hours. Sepsis Labs: No results for input(s): "PROCALCITON", "WBC", "LATICACIDVEN" in the last 168 hours. Microbiology No results found for this or any previous visit (from the past 240 hour(s)).   Medications:    amLODipine  10 mg Oral Daily   vitamin C  1,000 mg Oral Daily   collagenase   Topical Daily   docusate sodium  100 mg Oral Daily   folic acid  1 mg Oral Daily   furosemide  40 mg Oral Daily   gabapentin  900 mg Oral TID   isosorbide mononitrate  15 mg Oral Daily   melatonin  3 mg Oral QHS   mometasone-formoterol  2 puff Inhalation BID   morphine  30 mg Oral Q12H   multivitamin with minerals  1 tablet Oral Daily   pantoprazole  40 mg Oral Daily   thiamine  100 mg Oral Daily   Continuous Infusions:  magnesium sulfate bolus IVPB        LOS: 61 days   Charlynne Cousins  [facility]  02/06/2023, 8:10 AM

## 2023-02-07 DIAGNOSIS — I96 Gangrene, not elsewhere classified: Secondary | ICD-10-CM | POA: Diagnosis not present

## 2023-02-07 DIAGNOSIS — L03116 Cellulitis of left lower limb: Secondary | ICD-10-CM | POA: Diagnosis not present

## 2023-02-07 NOTE — Progress Notes (Signed)
[facility] PROGRESS NOTE    Progress Note  [name]  [id number] DOB: [birth year] DOA: 12/07/2022 PCP: Marliss Coots, NP     Brief Narrative:   Martize Lary is an 57 y.o. 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 Has completed 2 weeks of Doxy.  Physical therapy evaluated the patient. Continue Neurontin, oxycodone and long-acting narcotics for pain. Last hemoglobin A1c of 6.1 on 12/07/2020 . Awaiting skilled nursing facility placement.  Essential hypertension: Relatively stable, continue on the amlodipine and Lasix.  Chronic combined systolic and diastolic heart failure: Appears to be compensated continue Lasix, has been refusing labs intermittently. Potassium seems to be stable. He does need to be notified of when labs are can to be drawn and why.  COPD: Stable continue inhalers.  Anxiety: Continue Atarax.  GERD: Continue PPI.  Constipation: Continue bowel regimen.  Schizoaffective disorder, bipolar type ([place]) Noted   DVT prophylaxis: lovenox Family Communication:none Status is: Inpatient Remains inpatient appropriate because: Awaiting placement.    Code Status:     Code Status Orders  (From admission, onward)           Start     Ordered   12/07/22 1711  Full code  Continuous       Question Answer Comment  By: Other   Comments: other      12/07/22 1712           Code Status History     Date Active Date Inactive Code Status Order ID Comments User  Context   11/24/2022 0745 12/03/2022 1902 Full Code [id number]  Reubin Milan, MD ED   11/03/2022 0253 11/03/2022 1937 Full Code [id number]  Larene Pickett, PA-C ED   08/17/2022 0142 10/09/2022 2035 Full Code [id number]  Clance Boll, MD ED   07/12/2022 1026 07/25/2022 2113 Full Code [id number]  Reubin Milan, MD ED   04/04/2022 1240 04/11/2022 1710 Full Code [id number]  Reubin Milan, MD ED   04/03/2022 1411 04/04/2022 1240 Full Code [id number]  Dorothyann Peng, PA-C ED   03/23/2022 2112 03/31/2022 1517 Full Code [id number]  Elodia Florence., MD Inpatient   02/26/2022 1812 03/07/2022 1908 Full Code [id number]  Jonnie Finner, DO Inpatient   01/29/2022 1515 02/05/2022 1533 Full Code [id number]  Orma Flaming, MD ED   12/03/2021 0841 12/09/2021 1457 Full Code [id number]  Vernelle Emerald, MD ED   11/24/2021 2315 11/25/2021 2004 Full Code [id number]  Muthersbaugh, Gwenlyn Perking ED   11/13/2021 1653 11/21/2021 1748 Full Code [id number]  Reubin Milan, MD Inpatient   11/02/2021 2000 11/04/2021 2331 Full Code [id number]  Varney Biles, MD ED   07/03/2021 1143 07/05/2021 0057 Full Code [id number]  Regan Lemming, MD ED   03/02/2021 1936 03/12/2021 1811 Full Code [id number]  Lenore Cordia, MD ED   02/10/2021 2345 02/13/2021 1958 Full Code [id number]  Larene Pickett, PA-C ED   02/09/2021 1953 02/10/2021 1536 Full Code [id number]  Arnaldo Natal, MD ED   12/17/2020 1516 02/03/2021 1650 Full Code [id number]  Gardiner Barefoot, NP ED   12/08/2020 1336  12/10/2020 1931 Full Code [id number]  Alcus Dad, MD ED   11/13/2020 2028 11/16/2020 1520 Full Code [id number]  Jonnie Finner, DO ED   10/26/2020 1039 10/31/2020 2157 Full Code [id number]  Harold Hedge, MD ED   10/15/2020 [phone] 10/18/2020 1810 Full Code [id number]  Chotiner, Yevonne Aline, MD Inpatient   10/02/2020 2247 10/07/2020 1809 Full Code [id number]  Vianne Bulls, MD ED   09/25/2020 1559 09/26/2020 2002 Full Code [id number]  Para Skeans, MD ED   09/09/2020 1251  09/11/2020 1546 Full Code [id number]  Dessa Phi, DO Inpatient   09/07/2020 1753 09/09/2020 1251 DNR [id number]  Jonnie Finner, DO Inpatient   07/21/2020 1700 07/23/2020 2001 Full Code [id number]  Mosetta Anis, MD ED   07/07/2020 1529 07/07/2020 2109 Full Code [id number]  Wyvonnia Dusky, MD ED   06/24/2019 0131 06/24/2019 1630 Full Code [id number]  Rozetta Nunnery, NP Inpatient   06/24/2019 0130 06/24/2019 0131 Full Code [id number]  Mordecai Maes, NP Inpatient   06/21/2019 0941 06/22/2019 2007 Full Code [id number]  Rory Percy, DO Inpatient   05/24/2019 1117 05/26/2019 1836 Full Code [id number]  Everrett Coombe, MD ED   04/18/2019 0149 04/19/2019 1628 DNR [id number]  Ina Homes, MD ED   04/04/2019 0555 04/06/2019 1413 Full Code [id number]  Lars Mage, MD ED   03/27/2019 2130 03/29/2019 1900 Full Code [id number]  Mosetta Anis, MD ED   03/04/2019 1600 03/09/2019 1222 Full Code [id number]  Ledell Noss, MD ED   02/13/2018 0439 02/14/2018 1333 Full Code [id number]  Ezequiel Essex, MD ED   12/26/2016 0547 12/29/2016 2021 Full Code [id number]  Clapacs, Madie Reno, MD Inpatient   11/27/2016 1541 11/28/2016 1319 Full Code [id number]  Dalia Heading, PA-C ED   10/29/2016 1415 10/31/2016 1150 Full Code [id number]  Davonna Belling, MD ED   09/06/2016 2230 09/07/2016 1350 Full Code [id number]  Orlie Dakin, MD ED   09/06/2016 0934 09/06/2016 1529 Full Code [id number]  Macarthur Critchley, MD ED   09/02/2016 0045 09/04/2016 2127 Full Code [id number]  Junius Creamer, NP ED   07/30/2016 1357 08/06/2016 2031 Full Code [id number]  Nanci Pina, [place] Inpatient   07/30/2016 0143 07/30/2016 1153 Full Code [id number]  Orpah Greek, MD ED   05/30/2016 1622 06/05/2016 1454 Full Code [id number]  Niel Hummer, NP Inpatient   05/29/2016 1635 05/30/2016 1622 Full Code [id number]  Deno Etienne, DO ED   12/02/2011 0245 12/12/2011 0720 Full Code [id number]  Carlisle Cater, [place] ED         IV Access:   Peripheral IV   Procedures and diagnostic  studies:   No results found.   Medical Consultants:   None.   Subjective:    [name] no complaints.  Objective:    Vitals:   02/06/23 1317 02/06/23 2002 02/07/23 0405 02/07/23 0710  BP: (!) 148/133 135/76    Pulse: 79 81    Resp:  18    Temp: (!) 97.5 F (36.4 C) 97.6 F (36.4 C)    TempSrc: Oral Oral    SpO2: 97% 97%  100%  Weight:   89.8 kg   Height:       SpO2: 100 % O2 Flow Rate (L/min): 2 L/min   Intake/Output Summary (Last 24 hours) at 02/07/2023 0807 Last data filed at 02/07/2023 0400 Gross per 24 hour  Intake 240 ml  Output 1400 ml  Net -1160 ml    Filed Weights   02/05/23 0500 02/06/23 0407  02/07/23 0405  Weight: 88 kg 88.4 kg 89.8 kg    Exam: General exam: In no acute distress. Respiratory system: Good air movement and clear to auscultation. Cardiovascular system: S1 & S2 heard, RRR. No JVD. Gastrointestinal system: Abdomen is nondistended, soft and nontender.  Skin: No rashes, lesions or ulcers Psychiatry: Judgement and insight appear normal. Mood & affect appropriate.    Data Reviewed:    Labs: Basic Metabolic Panel: Recent Labs  Lab 02/04/23 0820  NA 139  K 3.9  CL 106  CO2 24  GLUCOSE 92  BUN 17  CREATININE 0.90  CALCIUM 9.3    GFR Estimated Creatinine Clearance: 103.9 mL/min (by C-G formula based on SCr of 0.9 mg/dL). Liver Function Tests: No results for input(s): "AST", "ALT", "ALKPHOS", "BILITOT", "PROT", "ALBUMIN" in the last 168 hours. No results for input(s): "LIPASE", "AMYLASE" in the last 168 hours. No results for input(s): "AMMONIA" in the last 168 hours. Coagulation profile No results for input(s): "INR", "PROTIME" in the last 168 hours. COVID-19 Labs  No results for input(s): "DDIMER", "FERRITIN", "LDH", "CRP" in the last 72 hours.  Lab Results  Component Value Date   SARSCOV2NAA NEGATIVE 11/23/2022   SARSCOV2NAA NEGATIVE 11/03/2022   [place] NEGATIVE 07/12/2022   [place] NEGATIVE  04/03/2022    CBC: No results for input(s): "WBC", "NEUTROABS", "HGB", "HCT", "MCV", "PLT" in the last 168 hours. Cardiac Enzymes: No results for input(s): "CKTOTAL", "CKMB", "CKMBINDEX", "TROPONINI" in the last 168 hours. BNP (last 3 results) No results for input(s): "PROBNP" in the last 8760 hours. CBG: No results for input(s): "GLUCAP" in the last 168 hours. D-Dimer: No results for input(s): "DDIMER" in the last 72 hours. Hgb A1c: No results for input(s): "HGBA1C" in the last 72 hours. Lipid Profile: No results for input(s): "CHOL", "HDL", "LDLCALC", "TRIG", "CHOLHDL", "LDLDIRECT" in the last 72 hours. Thyroid function studies: No results for input(s): "TSH", "T4TOTAL", "T3FREE", "THYROIDAB" in the last 72 hours.  Invalid input(s): "FREET3" Anemia work up: No results for input(s): "VITAMINB12", "FOLATE", "FERRITIN", "TIBC", "IRON", "RETICCTPCT" in the last 72 hours. Sepsis Labs: No results for input(s): "PROCALCITON", "WBC", "LATICACIDVEN" in the last 168 hours. Microbiology No results found for this or any previous visit (from the past 240 hour(s)).   Medications:    amLODipine  10 mg Oral Daily   vitamin C  1,000 mg Oral Daily   collagenase   Topical Daily   docusate sodium  100 mg Oral Daily   folic acid  1 mg Oral Daily   furosemide  40 mg Oral Daily   gabapentin  900 mg Oral TID   isosorbide mononitrate  15 mg Oral Daily   melatonin  3 mg Oral QHS   mometasone-formoterol  2 puff Inhalation BID   morphine  30 mg Oral Q12H   multivitamin with minerals  1 tablet Oral Daily   pantoprazole  40 mg Oral Daily   thiamine  100 mg Oral Daily   Continuous Infusions:  magnesium sulfate bolus IVPB        LOS: 62 days   Charlynne Cousins  [facility]  02/07/2023, 8:07 AM

## 2023-02-07 NOTE — Plan of Care (Signed)

## 2023-02-08 DIAGNOSIS — I96 Gangrene, not elsewhere classified: Secondary | ICD-10-CM | POA: Diagnosis not present

## 2023-02-08 DIAGNOSIS — L03116 Cellulitis of left lower limb: Secondary | ICD-10-CM | POA: Diagnosis not present

## 2023-02-08 NOTE — Plan of Care (Signed)
  Problem: Fluid Volume: Goal: Hemodynamic stability will improve Outcome: Adequate for Discharge   Problem: Education: Goal: Knowledge of General Education information will improve Description: Including pain rating scale, medication(s)/side effects and non-pharmacologic comfort measures Outcome: Adequate for Discharge   Problem: Health Behavior/Discharge Planning: Goal: Ability to manage health-related needs will improve Outcome: Adequate for Discharge

## 2023-02-08 NOTE — Progress Notes (Signed)
[facility] PROGRESS NOTE    Progress Note  [name]  [id number] DOB: [birth year] DOA: 12/07/2022 PCP: Marliss Coots, NP     Brief Narrative:   [name] is an 57 y.o. 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 Has completed 2 weeks of Doxy.  Physical therapy evaluated the patient. Continue Neurontin, oxycodone and long-acting narcotics for pain. Last hemoglobin A1c of 6.1 on 12/07/2020 . Awaiting skilled nursing facility placement.  Essential hypertension: Relatively stable, continue on the amlodipine and Lasix.  Chronic combined systolic and diastolic heart failure: Appears to be compensated continue Lasix, has been refusing labs intermittently. Potassium seems to be stable. He does need to be notified of when labs are can to be drawn and why.  COPD: Stable continue inhalers.  Anxiety: Continue Atarax.  GERD: Continue PPI.  Constipation: Continue bowel regimen.  Schizoaffective disorder, bipolar type ([place]) Noted   DVT prophylaxis: lovenox Family Communication:none Status is: Inpatient Remains inpatient appropriate because: Awaiting placement.    Code Status:     Code Status Orders  (From admission, onward)           Start     Ordered   12/07/22 1711  Full code  Continuous       Question Answer Comment  By: Other   Comments: other      12/07/22 1712           Code Status History     Date Active Date Inactive Code Status Order ID Comments User  Context   11/24/2022 0745 12/03/2022 1902 Full Code [id number]  Reubin Milan, MD ED   11/03/2022 0253 11/03/2022 1937 Full Code [id number]  Larene Pickett, PA-C ED   08/17/2022 0142 10/09/2022 2035 Full Code [id number]  Clance Boll, MD ED   07/12/2022 1026 07/25/2022 2113 Full Code [id number]  Reubin Milan, MD ED   04/04/2022 1240 04/11/2022 1710 Full Code [id number]  Reubin Milan, MD ED   04/03/2022 1411 04/04/2022 1240 Full Code [id number]  Dorothyann Peng, PA-C ED   03/23/2022 2112 03/31/2022 1517 Full Code [id number]  Elodia Florence., MD Inpatient   02/26/2022 1812 03/07/2022 1908 Full Code [id number]  Jonnie Finner, DO Inpatient   01/29/2022 1515 02/05/2022 1533 Full Code [id number]  Orma Flaming, MD ED   12/03/2021 0841 12/09/2021 1457 Full Code [id number]  Vernelle Emerald, MD ED   11/24/2021 2315 11/25/2021 2004 Full Code [id number]  Muthersbaugh, Gwenlyn Perking ED   11/13/2021 1653 11/21/2021 1748 Full Code [id number]  Reubin Milan, MD Inpatient   11/02/2021 2000 11/04/2021 2331 Full Code [id number]  Varney Biles, MD ED   07/03/2021 1143 07/05/2021 0057 Full Code [id number]  Regan Lemming, MD ED   03/02/2021 1936 03/12/2021 1811 Full Code [id number]  Lenore Cordia, MD ED   02/10/2021 2345 02/13/2021 1958 Full Code [id number]  Larene Pickett, PA-C ED   02/09/2021 1953 02/10/2021 1536 Full Code [id number]  Arnaldo Natal, MD ED   12/17/2020 1516 02/03/2021 1650 Full Code [id number]  Gardiner Barefoot, NP ED   12/08/2020 1336  12/10/2020 1931 Full Code [id number]  Alcus Dad, MD ED   11/13/2020 2028 11/16/2020 1520 Full Code [id number]  Jonnie Finner, DO ED   10/26/2020 1039 10/31/2020 2157 Full Code [id number]  Harold Hedge, MD ED   10/15/2020 [phone] 10/18/2020 1810 Full Code [id number]  Chotiner, Yevonne Aline, MD Inpatient   10/02/2020 2247 10/07/2020 1809 Full Code [id number]  Vianne Bulls, MD ED   09/25/2020 1559 09/26/2020 2002 Full Code [id number]  Para Skeans, MD ED   09/09/2020 1251  09/11/2020 1546 Full Code [id number]  Dessa Phi, DO Inpatient   09/07/2020 1753 09/09/2020 1251 DNR [id number]  Jonnie Finner, DO Inpatient   07/21/2020 1700 07/23/2020 2001 Full Code [id number]  Mosetta Anis, MD ED   07/07/2020 1529 07/07/2020 2109 Full Code [id number]  Wyvonnia Dusky, MD ED   06/24/2019 0131 06/24/2019 1630 Full Code [id number]  Rozetta Nunnery, NP Inpatient   06/24/2019 0130 06/24/2019 0131 Full Code [id number]  Mordecai Maes, NP Inpatient   06/21/2019 0941 06/22/2019 2007 Full Code [id number]  Rory Percy, DO Inpatient   05/24/2019 1117 05/26/2019 1836 Full Code [id number]  Everrett Coombe, MD ED   04/18/2019 0149 04/19/2019 1628 DNR [id number]  Ina Homes, MD ED   04/04/2019 0555 04/06/2019 1413 Full Code [id number]  Lars Mage, MD ED   03/27/2019 2130 03/29/2019 1900 Full Code [id number]  Mosetta Anis, MD ED   03/04/2019 1600 03/09/2019 1222 Full Code [id number]  Ledell Noss, MD ED   02/13/2018 0439 02/14/2018 1333 Full Code [id number]  Ezequiel Essex, MD ED   12/26/2016 0547 12/29/2016 2021 Full Code [id number]  Clapacs, Madie Reno, MD Inpatient   11/27/2016 1541 11/28/2016 1319 Full Code [id number]  Dalia Heading, PA-C ED   10/29/2016 1415 10/31/2016 1150 Full Code [id number]  Davonna Belling, MD ED   09/06/2016 2230 09/07/2016 1350 Full Code [id number]  Orlie Dakin, MD ED   09/06/2016 0934 09/06/2016 1529 Full Code [id number]  Macarthur Critchley, MD ED   09/02/2016 0045 09/04/2016 2127 Full Code [id number]  Junius Creamer, NP ED   07/30/2016 1357 08/06/2016 2031 Full Code [id number]  Nanci Pina, [place] Inpatient   07/30/2016 0143 07/30/2016 1153 Full Code [id number]  Orpah Greek, MD ED   05/30/2016 1622 06/05/2016 1454 Full Code [id number]  Niel Hummer, NP Inpatient   05/29/2016 1635 05/30/2016 1622 Full Code [id number]  Deno Etienne, DO ED   12/02/2011 0245 12/12/2011 0720 Full Code [id number]  Carlisle Cater, [place] ED         IV Access:   Peripheral IV   Procedures and diagnostic  studies:   No results found.   Medical Consultants:   None.   Subjective:    [name] no complaints.  Objective:    Vitals:   02/07/23 0710 02/07/23 0841 02/07/23 2132 02/08/23 0503  BP:   115/87 132/82  Pulse:   82 80  Resp:   16 16  Temp:   98 F (36.7 C) 98.3 F (36.8 C)  TempSrc:   Oral Oral  SpO2: 100% 100% 95% 97%  Weight:      Height:       SpO2: 97 % O2 Flow Rate (L/min): 2 L/min   Intake/Output Summary (Last 24 hours) at 02/08/2023 0754 Last data filed at 02/08/2023 0503 Gross per 24 hour  Intake 480 ml  Output --  Net 480 ml    Filed Weights   02/05/23 0500 02/06/23 0407 02/07/23 0405  Weight:  88 kg 88.4 kg 89.8 kg    Exam: General exam: In no acute distress. Respiratory system: Good air movement and clear to auscultation. Cardiovascular system: S1 & S2 heard, RRR. No JVD. Gastrointestinal system: Abdomen is nondistended, soft and nontender.  Skin: No rashes, lesions or ulcers Psychiatry: Judgement and insight appear normal. Mood & affect appropriate.    Data Reviewed:    Labs: Basic Metabolic Panel: Recent Labs  Lab 02/04/23 0820  NA 139  K 3.9  CL 106  CO2 24  GLUCOSE 92  BUN 17  CREATININE 0.90  CALCIUM 9.3    GFR Estimated Creatinine Clearance: 103.9 mL/min (by C-G formula based on SCr of 0.9 mg/dL). Liver Function Tests: No results for input(s): "AST", "ALT", "ALKPHOS", "BILITOT", "PROT", "ALBUMIN" in the last 168 hours. No results for input(s): "LIPASE", "AMYLASE" in the last 168 hours. No results for input(s): "AMMONIA" in the last 168 hours. Coagulation profile No results for input(s): "INR", "PROTIME" in the last 168 hours. COVID-19 Labs  No results for input(s): "DDIMER", "FERRITIN", "LDH", "CRP" in the last 72 hours.  Lab Results  Component Value Date   SARSCOV2NAA NEGATIVE 11/23/2022   SARSCOV2NAA NEGATIVE 11/03/2022   [place] NEGATIVE 07/12/2022   [place] NEGATIVE 04/03/2022    CBC: No  results for input(s): "WBC", "NEUTROABS", "HGB", "HCT", "MCV", "PLT" in the last 168 hours. Cardiac Enzymes: No results for input(s): "CKTOTAL", "CKMB", "CKMBINDEX", "TROPONINI" in the last 168 hours. BNP (last 3 results) No results for input(s): "PROBNP" in the last 8760 hours. CBG: No results for input(s): "GLUCAP" in the last 168 hours. D-Dimer: No results for input(s): "DDIMER" in the last 72 hours. Hgb A1c: No results for input(s): "HGBA1C" in the last 72 hours. Lipid Profile: No results for input(s): "CHOL", "HDL", "LDLCALC", "TRIG", "CHOLHDL", "LDLDIRECT" in the last 72 hours. Thyroid function studies: No results for input(s): "TSH", "T4TOTAL", "T3FREE", "THYROIDAB" in the last 72 hours.  Invalid input(s): "FREET3" Anemia work up: No results for input(s): "VITAMINB12", "FOLATE", "FERRITIN", "TIBC", "IRON", "RETICCTPCT" in the last 72 hours. Sepsis Labs: No results for input(s): "PROCALCITON", "WBC", "LATICACIDVEN" in the last 168 hours. Microbiology No results found for this or any previous visit (from the past 240 hour(s)).   Medications:    amLODipine  10 mg Oral Daily   vitamin C  1,000 mg Oral Daily   collagenase   Topical Daily   docusate sodium  100 mg Oral Daily   folic acid  1 mg Oral Daily   furosemide  40 mg Oral Daily   gabapentin  900 mg Oral TID   isosorbide mononitrate  15 mg Oral Daily   melatonin  3 mg Oral QHS   mometasone-formoterol  2 puff Inhalation BID   morphine  30 mg Oral Q12H   multivitamin with minerals  1 tablet Oral Daily   pantoprazole  40 mg Oral Daily   thiamine  100 mg Oral Daily   Continuous Infusions:  magnesium sulfate bolus IVPB        LOS: 63 days   Charlynne Cousins  [facility]  02/08/2023, 7:54 AM

## 2023-02-09 DIAGNOSIS — L03116 Cellulitis of left lower limb: Secondary | ICD-10-CM | POA: Diagnosis not present

## 2023-02-09 DIAGNOSIS — I96 Gangrene, not elsewhere classified: Secondary | ICD-10-CM | POA: Diagnosis not present

## 2023-02-09 NOTE — Progress Notes (Signed)
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 PMH includes asthma, bioplar 1, depression, schizoaffective disorder, CVA, chronic CHF, homelessness, HTN, DM2, cocaine and cannabis use, and R AKA.    PT Comments    Agreeable to further transfer training. Performed with sliding board today as an alternative option as patient reports anxiety with transfers from w/c to bed, but confident with transfers from bed to w/c. Pt tolerated transfer to mat table from w/c quite well. Upon return however pt a bit anxious sliding into w/c and angry that bed pad bunched up. Further discussions reviewed concerning independence with mobility. Pt expresses goals of being able to transfer to [facility] from w/c, and maybe onto a tub bench. Patient will continue to benefit from skilled physical therapy services to further improve independence with functional mobility.    Recommendations for follow up therapy are one component of a multi-disciplinary discharge planning process, led by the attending physician.  Recommendations may be updated based on patient status, additional functional criteria and insurance authorization.  Follow Up Recommendations  Skilled nursing-short term rehab (<3 hours/day) (TOC considering lots of options; drug rehab, halfway houses, etc) Can patient physically be transported by private vehicle: No   Assistance Recommended at Discharge Intermittent Supervision/Assistance  Patient can return home with the following A little help with walking and/or transfers;A little help with bathing/dressing/bathroom;Assistance with cooking/housework;Assist for transportation;Help with stairs or ramp for entrance   Equipment Recommendations  Other (comment) (New wheelchair)    Recommendations for Other Services Other  (comment) (Prosthetist consult)     Precautions / Restrictions Precautions Precautions: Fall Precaution Comments: R AKA, L BKA Restrictions Weight Bearing Restrictions: Yes RLE Weight Bearing: Non weight bearing LLE Weight Bearing: Non weight bearing     Mobility  Bed Mobility               General bed mobility comments: In w/c    Transfers Overall transfer level: Needs assistance Equipment used: Sliding board Transfers: Bed to chair/wheelchair/BSC            Lateral/Scoot Transfers: Supervision General transfer comment: Simulated w/c transfers on table mat using sliding board. Performed towards left side from w/c to mat, and towards Rt side from mat to w/c. Supervision for safety. Pt anxious returning to w/c but performed without physical assist, reassured by close presence. Pt then became upset that bed pad bunched up underneath and seemed unclean. Offerred to replace with clean bed pad but refused.    Ambulation/Gait               General Gait Details: Unable   Marine scientist Rankin (Stroke Patients Only)       Balance Overall balance assessment: Needs assistance Sitting-balance support: Feet unsupported, No upper extremity supported Sitting balance-Leahy Scale: Good Sitting balance - Comments: seated in w/c       Standing balance comment: unable                            Cognition Arousal/Alertness: Awake/alert Behavior During Therapy: WFL for tasks assessed/performed Overall Cognitive Status: No family/caregiver present to determine baseline cognitive functioning Area of Impairment: Problem solving  Problem Solving: Requires verbal cues General Comments: Needs redirected at times.        Exercises      General Comments General comments (skin integrity, edema, etc.): Prolonged discussion regarding goals, transfer training, assistive  devices. Pt feels confident getting out of bed without assistance, however states he likes for someone to block walker as he goes from w/c to bed. He has been performing lateral scoot, lifting buttock over wheel, but feels unsafe at times however confidence is improving. Agreeable to try sliding board. Tolerated well but a bit anxious returning into w/c and became upset that his bed pad got bunched up underneath his bottom and was dirty. Would not allow therapist to assist and declined further PT today.      Pertinent Vitals/Pain Pain Assessment Pain Assessment: No/denies pain Pain Intervention(s): Monitored during session    Home Living                          Prior Function            PT Goals (current goals can now be found in the care plan section) Acute Rehab PT Goals Patient Stated Goal: Needs to be able to transfer independently PT Goal Formulation: With patient Time For Goal Achievement: 02/16/23 Potential to Achieve Goals: Good Progress towards PT goals: Progressing toward goals    Frequency    Min 1X/week      PT Plan Current plan remains appropriate    Co-evaluation              AM-PAC PT "6 Clicks" Mobility   Outcome Measure  Help needed turning from your back to your side while in a flat bed without using bedrails?: None Help needed moving from lying on your back to sitting on the side of a flat bed without using bedrails?: None Help needed moving to and from a bed to a chair (including a wheelchair)?: A Little Help needed standing up from a chair using your arms (e.g., wheelchair or bedside chair)?: Total Help needed to walk in hospital room?: Total Help needed climbing 3-5 steps with a railing? : Total 6 Click Score: 14    End of Session Equipment Utilized During Treatment: Gait belt Activity Tolerance: Patient tolerated treatment well Patient left: Other (comment) (In wheelchair cruising halls) Nurse Communication: Mobility status PT  Visit Diagnosis: Muscle weakness (generalized) (M62.81);Difficulty in walking, not elsewhere classified (R26.2);Other abnormalities of gait and mobility (R26.89)     Time: [id number] PT Time Calculation (min) (ACUTE ONLY): 25 min  Charges:  $Therapeutic Activity: 8-22 mins $Self Care/Home Management: 8-22                     Candie Mile, PT, DPT Physical Therapist [facility] [place]    [name] Newer 02/09/2023, 3:36 PM

## 2023-02-09 NOTE — Progress Notes (Signed)
[facility] PROGRESS NOTE    Progress Note  Izaiyah Hofler  [id number] DOB: [birth year] DOA: 12/07/2022 PCP: Marliss Coots, NP     Brief Narrative:   Martize Lary is an 57 y.o. 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 Has completed 2 weeks of Doxy.  Physical therapy evaluated the patient. Continue Neurontin, oxycodone and long-acting narcotics for pain. Last hemoglobin A1c of 6.1 on 12/07/2020 . Awaiting skilled nursing facility placement.  Essential hypertension: Relatively stable, continue on the amlodipine and Lasix.  Chronic combined systolic and diastolic heart failure: Appears to be compensated continue Lasix, has been refusing labs intermittently. Potassium seems to be stable. He does need to be notified of when labs are can to be drawn and why.  COPD: Stable continue inhalers.  Anxiety: Continue Atarax.  GERD: Continue PPI.  Constipation: Continue bowel regimen.  Schizoaffective disorder, bipolar type ([place]) Noted   DVT prophylaxis: lovenox Family Communication:none Status is: Inpatient Remains inpatient appropriate because: Awaiting placement.    Code Status:     Code Status Orders  (From admission, onward)           Start     Ordered   12/07/22 1711  Full code  Continuous       Question Answer Comment  By: Other   Comments: other      12/07/22 1712           Code Status History     Date Active Date Inactive Code Status Order ID Comments User  Context   11/24/2022 0745 12/03/2022 1902 Full Code [id number]  Reubin Milan, MD ED   11/03/2022 0253 11/03/2022 1937 Full Code [id number]  Larene Pickett, PA-C ED   08/17/2022 0142 10/09/2022 2035 Full Code [id number]  Clance Boll, MD ED   07/12/2022 1026 07/25/2022 2113 Full Code [id number]  Reubin Milan, MD ED   04/04/2022 1240 04/11/2022 1710 Full Code [id number]  Reubin Milan, MD ED   04/03/2022 1411 04/04/2022 1240 Full Code [id number]  Dorothyann Peng, PA-C ED   03/23/2022 2112 03/31/2022 1517 Full Code [id number]  Elodia Florence., MD Inpatient   02/26/2022 1812 03/07/2022 1908 Full Code [id number]  Jonnie Finner, DO Inpatient   01/29/2022 1515 02/05/2022 1533 Full Code [id number]  Orma Flaming, MD ED   12/03/2021 0841 12/09/2021 1457 Full Code [id number]  Vernelle Emerald, MD ED   11/24/2021 2315 11/25/2021 2004 Full Code [id number]  Muthersbaugh, Gwenlyn Perking ED   11/13/2021 1653 11/21/2021 1748 Full Code [id number]  Reubin Milan, MD Inpatient   11/02/2021 2000 11/04/2021 2331 Full Code [id number]  Varney Biles, MD ED   07/03/2021 1143 07/05/2021 0057 Full Code [id number]  Regan Lemming, MD ED   03/02/2021 1936 03/12/2021 1811 Full Code [id number]  Lenore Cordia, MD ED   02/10/2021 2345 02/13/2021 1958 Full Code [id number]  Larene Pickett, PA-C ED   02/09/2021 1953 02/10/2021 1536 Full Code [id number]  Arnaldo Natal, MD ED   12/17/2020 1516 02/03/2021 1650 Full Code [id number]  Gardiner Barefoot, NP ED   12/08/2020 1336  12/10/2020 1931 Full Code [id number]  Alcus Dad, MD ED   11/13/2020 2028 11/16/2020 1520 Full Code [id number]  Jonnie Finner, DO ED   10/26/2020 1039 10/31/2020 2157 Full Code [id number]  Harold Hedge, MD ED   10/15/2020 [phone] 10/18/2020 1810 Full Code [id number]  Chotiner, Yevonne Aline, MD Inpatient   10/02/2020 2247 10/07/2020 1809 Full Code [id number]  Vianne Bulls, MD ED   09/25/2020 1559 09/26/2020 2002 Full Code [id number]  Para Skeans, MD ED   09/09/2020 1251  09/11/2020 1546 Full Code [id number]  Dessa Phi, DO Inpatient   09/07/2020 1753 09/09/2020 1251 DNR [id number]  Jonnie Finner, DO Inpatient   07/21/2020 1700 07/23/2020 2001 Full Code [id number]  Mosetta Anis, MD ED   07/07/2020 1529 07/07/2020 2109 Full Code [id number]  Wyvonnia Dusky, MD ED   06/24/2019 0131 06/24/2019 1630 Full Code [id number]  Rozetta Nunnery, NP Inpatient   06/24/2019 0130 06/24/2019 0131 Full Code [id number]  Mordecai Maes, NP Inpatient   06/21/2019 0941 06/22/2019 2007 Full Code [id number]  Rory Percy, DO Inpatient   05/24/2019 1117 05/26/2019 1836 Full Code [id number]  Everrett Coombe, MD ED   04/18/2019 0149 04/19/2019 1628 DNR [id number]  Ina Homes, MD ED   04/04/2019 0555 04/06/2019 1413 Full Code [id number]  Lars Mage, MD ED   03/27/2019 2130 03/29/2019 1900 Full Code [id number]  Mosetta Anis, MD ED   03/04/2019 1600 03/09/2019 1222 Full Code [id number]  Ledell Noss, MD ED   02/13/2018 0439 02/14/2018 1333 Full Code [id number]  Ezequiel Essex, MD ED   12/26/2016 0547 12/29/2016 2021 Full Code [id number]  Clapacs, Madie Reno, MD Inpatient   11/27/2016 1541 11/28/2016 1319 Full Code [id number]  Dalia Heading, PA-C ED   10/29/2016 1415 10/31/2016 1150 Full Code [id number]  Davonna Belling, MD ED   09/06/2016 2230 09/07/2016 1350 Full Code [id number]  Orlie Dakin, MD ED   09/06/2016 0934 09/06/2016 1529 Full Code [id number]  Macarthur Critchley, MD ED   09/02/2016 0045 09/04/2016 2127 Full Code [id number]  Junius Creamer, NP ED   07/30/2016 1357 08/06/2016 2031 Full Code [id number]  Nanci Pina, [place] Inpatient   07/30/2016 0143 07/30/2016 1153 Full Code [id number]  Orpah Greek, MD ED   05/30/2016 1622 06/05/2016 1454 Full Code [id number]  Niel Hummer, NP Inpatient   05/29/2016 1635 05/30/2016 1622 Full Code [id number]  Deno Etienne, DO ED   12/02/2011 0245 12/12/2011 0720 Full Code [id number]  Carlisle Cater, [place] ED         IV Access:   Peripheral IV   Procedures and diagnostic  studies:   No results found.   Medical Consultants:   None.   Subjective:    [name] no complaints.  Objective:    Vitals:   02/08/23 0718 02/08/23 0803 02/08/23 2036 02/09/23 0727  BP:  131/83 (!) 132/96 (!) 144/82  Pulse:  85 76 72  Resp:  16 18   Temp:  (!) 97.5 F (36.4 C) 97.6 F (36.4 C) 97.7 F (36.5 C)  TempSrc:    Oral  SpO2: 100% 99% 97% 98%  Weight:      Height:       SpO2: 98 % O2 Flow Rate (L/min): 2 L/min  No intake or output data in the 24 hours ending 02/09/23 0921  Filed Weights   02/05/23 0500 02/06/23 0407 02/07/23 0405  Weight: 88 kg 88.4 kg 89.8 kg    Exam: General exam: In no  acute distress. Respiratory system: Good air movement and clear to auscultation. Cardiovascular system: S1 & S2 heard, RRR. No JVD. Gastrointestinal system: Abdomen is nondistended, soft and nontender.  Skin: No rashes, lesions or ulcers Psychiatry: Judgement and insight appear normal. Mood & affect appropriate.    Data Reviewed:    Labs: Basic Metabolic Panel: Recent Labs  Lab 02/04/23 0820  NA 139  K 3.9  CL 106  CO2 24  GLUCOSE 92  BUN 17  CREATININE 0.90  CALCIUM 9.3    GFR Estimated Creatinine Clearance: 103.9 mL/min (by C-G formula based on SCr of 0.9 mg/dL). Liver Function Tests: No results for input(s): "AST", "ALT", "ALKPHOS", "BILITOT", "PROT", "ALBUMIN" in the last 168 hours. No results for input(s): "LIPASE", "AMYLASE" in the last 168 hours. No results for input(s): "AMMONIA" in the last 168 hours. Coagulation profile No results for input(s): "INR", "PROTIME" in the last 168 hours. COVID-19 Labs  No results for input(s): "DDIMER", "FERRITIN", "LDH", "CRP" in the last 72 hours.  Lab Results  Component Value Date   SARSCOV2NAA NEGATIVE 11/23/2022   SARSCOV2NAA NEGATIVE 11/03/2022   [place] NEGATIVE 07/12/2022   [place] NEGATIVE 04/03/2022    CBC: No results for input(s): "WBC", "NEUTROABS", "HGB", "HCT",  "MCV", "PLT" in the last 168 hours. Cardiac Enzymes: No results for input(s): "CKTOTAL", "CKMB", "CKMBINDEX", "TROPONINI" in the last 168 hours. BNP (last 3 results) No results for input(s): "PROBNP" in the last 8760 hours. CBG: No results for input(s): "GLUCAP" in the last 168 hours. D-Dimer: No results for input(s): "DDIMER" in the last 72 hours. Hgb A1c: No results for input(s): "HGBA1C" in the last 72 hours. Lipid Profile: No results for input(s): "CHOL", "HDL", "LDLCALC", "TRIG", "CHOLHDL", "LDLDIRECT" in the last 72 hours. Thyroid function studies: No results for input(s): "TSH", "T4TOTAL", "T3FREE", "THYROIDAB" in the last 72 hours.  Invalid input(s): "FREET3" Anemia work up: No results for input(s): "VITAMINB12", "FOLATE", "FERRITIN", "TIBC", "IRON", "RETICCTPCT" in the last 72 hours. Sepsis Labs: No results for input(s): "PROCALCITON", "WBC", "LATICACIDVEN" in the last 168 hours. Microbiology No results found for this or any previous visit (from the past 240 hour(s)).   Medications:    amLODipine  10 mg Oral Daily   vitamin C  1,000 mg Oral Daily   collagenase   Topical Daily   docusate sodium  100 mg Oral Daily   folic acid  1 mg Oral Daily   furosemide  40 mg Oral Daily   gabapentin  900 mg Oral TID   isosorbide mononitrate  15 mg Oral Daily   melatonin  3 mg Oral QHS   mometasone-formoterol  2 puff Inhalation BID   morphine  30 mg Oral Q12H   multivitamin with minerals  1 tablet Oral Daily   pantoprazole  40 mg Oral Daily   thiamine  100 mg Oral Daily   Continuous Infusions:  magnesium sulfate bolus IVPB        LOS: 64 days   Charlynne Cousins  [facility]  02/09/2023, 9:21 AM

## 2023-02-09 NOTE — Progress Notes (Signed)
CSW spoke with [facility] who states the agency is unable to meet all of patient's needs - specifically his mental health and lack of housing after treatment is completed.  Madilyn Fireman, MSW, [name] Transitions of Care  Clinical Social Worker II [phone]

## 2023-02-09 NOTE — Plan of Care (Signed)
  Problem: Fluid Volume: Goal: Hemodynamic stability will improve Outcome: Adequate for Discharge   Problem: Education: Goal: Knowledge of General Education information will improve Description: Including pain rating scale, medication(s)/side effects and non-pharmacologic comfort measures Outcome: Adequate for Discharge   Problem: Health Behavior/Discharge Planning: Goal: Ability to manage health-related needs will improve Outcome: Adequate for Discharge   

## 2023-02-09 NOTE — Plan of Care (Signed)

## 2023-02-10 DIAGNOSIS — I96 Gangrene, not elsewhere classified: Secondary | ICD-10-CM | POA: Diagnosis not present

## 2023-02-10 DIAGNOSIS — L03116 Cellulitis of left lower limb: Secondary | ICD-10-CM | POA: Diagnosis not present

## 2023-02-10 NOTE — Plan of Care (Signed)

## 2023-02-10 NOTE — Progress Notes (Signed)
OT Cancellation Note  Patient Details Name: [name] MRN: [id number] DOB: 06-18-1966   Cancelled Treatment:    Reason Eval/Treat Not Completed: Patient declined, no reason specified (Pt refused to participate in OT session this date. Will re-attempt when able.)  Ailene Ravel, OTR/L,CBIS  Supplemental OT - [facility] and [facility] Secure Chat Preferred   02/10/2023, 3:00 PM

## 2023-02-10 NOTE — Progress Notes (Signed)
[facility] PROGRESS NOTE    Progress Note  [name]  [id number] DOB: [birth year] DOA: 12/07/2022 PCP: Marliss Coots, NP     Brief Narrative:   Martize Lary is an 57 y.o. 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 Has completed 2 weeks of Doxy.  Physical therapy evaluated the patient. Continue Neurontin, oxycodone and long-acting narcotics for pain. Last hemoglobin A1c of 6.1 on 12/07/2020 . Awaiting skilled nursing facility placement.  Essential hypertension: Relatively stable, continue on the amlodipine and Lasix.  Chronic combined systolic and diastolic heart failure: Appears to be compensated continue Lasix, has been refusing labs intermittently. Potassium seems to be stable. He does need to be notified of when labs are can to be drawn and why.  COPD: Stable continue inhalers.  Anxiety: Continue Atarax.  GERD: Continue PPI.  Constipation: Continue bowel regimen.  Schizoaffective disorder, bipolar type ([place]) Noted   DVT prophylaxis: lovenox Family Communication:none Status is: Inpatient Remains inpatient appropriate because: Awaiting placement.    Code Status:     Code Status Orders  (From admission, onward)           Start     Ordered   12/07/22 1711  Full code  Continuous       Question Answer Comment  By: Other   Comments: other      12/07/22 1712           Code Status History     Date Active Date Inactive Code Status Order ID Comments User  Context   11/24/2022 0745 12/03/2022 1902 Full Code [id number]  Reubin Milan, MD ED   11/03/2022 0253 11/03/2022 1937 Full Code [id number]  Larene Pickett, PA-C ED   08/17/2022 0142 10/09/2022 2035 Full Code [id number]  Clance Boll, MD ED   07/12/2022 1026 07/25/2022 2113 Full Code [id number]  Reubin Milan, MD ED   04/04/2022 1240 04/11/2022 1710 Full Code [id number]  Reubin Milan, MD ED   04/03/2022 1411 04/04/2022 1240 Full Code [id number]  Dorothyann Peng, PA-C ED   03/23/2022 2112 03/31/2022 1517 Full Code [id number]  Elodia Florence., MD Inpatient   02/26/2022 1812 03/07/2022 1908 Full Code [id number]  Jonnie Finner, DO Inpatient   01/29/2022 1515 02/05/2022 1533 Full Code [id number]  Orma Flaming, MD ED   12/03/2021 0841 12/09/2021 1457 Full Code [id number]  Vernelle Emerald, MD ED   11/24/2021 2315 11/25/2021 2004 Full Code [id number]  Muthersbaugh, Gwenlyn Perking ED   11/13/2021 1653 11/21/2021 1748 Full Code [id number]  Reubin Milan, MD Inpatient   11/02/2021 2000 11/04/2021 2331 Full Code [id number]  Varney Biles, MD ED   07/03/2021 1143 07/05/2021 0057 Full Code [id number]  Regan Lemming, MD ED   03/02/2021 1936 03/12/2021 1811 Full Code [id number]  Lenore Cordia, MD ED   02/10/2021 2345 02/13/2021 1958 Full Code [id number]  Larene Pickett, PA-C ED   02/09/2021 1953 02/10/2021 1536 Full Code [id number]  Arnaldo Natal, MD ED   12/17/2020 1516 02/03/2021 1650 Full Code [id number]  Gardiner Barefoot, NP ED   12/08/2020 1336  12/10/2020 1931 Full Code [id number]  Alcus Dad, MD ED   11/13/2020 2028 11/16/2020 1520 Full Code [id number]  Jonnie Finner, DO ED   10/26/2020 1039 10/31/2020 2157 Full Code [id number]  Harold Hedge, MD ED   10/15/2020 [phone] 10/18/2020 1810 Full Code [id number]  Chotiner, Yevonne Aline, MD Inpatient   10/02/2020 2247 10/07/2020 1809 Full Code [id number]  Vianne Bulls, MD ED   09/25/2020 1559 09/26/2020 2002 Full Code [id number]  Para Skeans, MD ED   09/09/2020 1251  09/11/2020 1546 Full Code [id number]  Dessa Phi, DO Inpatient   09/07/2020 1753 09/09/2020 1251 DNR [id number]  Jonnie Finner, DO Inpatient   07/21/2020 1700 07/23/2020 2001 Full Code [id number]  Mosetta Anis, MD ED   07/07/2020 1529 07/07/2020 2109 Full Code [id number]  Wyvonnia Dusky, MD ED   06/24/2019 0131 06/24/2019 1630 Full Code [id number]  Rozetta Nunnery, NP Inpatient   06/24/2019 0130 06/24/2019 0131 Full Code [id number]  Mordecai Maes, NP Inpatient   06/21/2019 0941 06/22/2019 2007 Full Code [id number]  Rory Percy, DO Inpatient   05/24/2019 1117 05/26/2019 1836 Full Code [id number]  Everrett Coombe, MD ED   04/18/2019 0149 04/19/2019 1628 DNR [id number]  Ina Homes, MD ED   04/04/2019 0555 04/06/2019 1413 Full Code [id number]  Lars Mage, MD ED   03/27/2019 2130 03/29/2019 1900 Full Code [id number]  Mosetta Anis, MD ED   03/04/2019 1600 03/09/2019 1222 Full Code [id number]  Ledell Noss, MD ED   02/13/2018 0439 02/14/2018 1333 Full Code [id number]  Ezequiel Essex, MD ED   12/26/2016 0547 12/29/2016 2021 Full Code [id number]  Clapacs, Madie Reno, MD Inpatient   11/27/2016 1541 11/28/2016 1319 Full Code [id number]  Dalia Heading, PA-C ED   10/29/2016 1415 10/31/2016 1150 Full Code [id number]  Davonna Belling, MD ED   09/06/2016 2230 09/07/2016 1350 Full Code [id number]  Orlie Dakin, MD ED   09/06/2016 0934 09/06/2016 1529 Full Code [id number]  Macarthur Critchley, MD ED   09/02/2016 0045 09/04/2016 2127 Full Code [id number]  Junius Creamer, NP ED   07/30/2016 1357 08/06/2016 2031 Full Code [id number]  Nanci Pina, [place] Inpatient   07/30/2016 0143 07/30/2016 1153 Full Code [id number]  Orpah Greek, MD ED   05/30/2016 1622 06/05/2016 1454 Full Code [id number]  Niel Hummer, NP Inpatient   05/29/2016 1635 05/30/2016 1622 Full Code [id number]  Deno Etienne, DO ED   12/02/2011 0245 12/12/2011 0720 Full Code [id number]  Carlisle Cater, [place] ED         IV Access:   Peripheral IV   Procedures and diagnostic  studies:   No results found.   Medical Consultants:   None.   Subjective:    [name] no complaints.  Objective:    Vitals:   02/09/23 1454 02/09/23 2020 02/10/23 0407 02/10/23 0722  BP: 138/89  122/70 128/81  Pulse: 80  73 74  Resp:   20   Temp: (!) 97.5 F (36.4 C)   98 F (36.7 C)  TempSrc: Oral   Oral  SpO2: 95% 95% 96% 100%  Weight:      Height:       SpO2: 100 % O2 Flow Rate (L/min): 2 L/min  No intake or output data in the 24 hours ending 02/10/23 0835  Filed Weights   02/05/23 0500 02/06/23 0407 02/07/23 0405  Weight: 88 kg 88.4 kg 89.8 kg    Exam: General exam: In no acute distress. Respiratory system: Good  air movement and clear to auscultation. Cardiovascular system: S1 & S2 heard, RRR. No JVD. Gastrointestinal system: Abdomen is nondistended, soft and nontender.  Skin: No rashes, lesions or ulcers Psychiatry: Judgement and insight appear normal. Mood & affect appropriate.    Data Reviewed:    Labs: Basic Metabolic Panel: Recent Labs  Lab 02/04/23 0820  NA 139  K 3.9  CL 106  CO2 24  GLUCOSE 92  BUN 17  CREATININE 0.90  CALCIUM 9.3    GFR Estimated Creatinine Clearance: 103.9 mL/min (by C-G formula based on SCr of 0.9 mg/dL). Liver Function Tests: No results for input(s): "AST", "ALT", "ALKPHOS", "BILITOT", "PROT", "ALBUMIN" in the last 168 hours. No results for input(s): "LIPASE", "AMYLASE" in the last 168 hours. No results for input(s): "AMMONIA" in the last 168 hours. Coagulation profile No results for input(s): "INR", "PROTIME" in the last 168 hours. COVID-19 Labs  No results for input(s): "DDIMER", "FERRITIN", "LDH", "CRP" in the last 72 hours.  Lab Results  Component Value Date   SARSCOV2NAA NEGATIVE 11/23/2022   SARSCOV2NAA NEGATIVE 11/03/2022   [place] NEGATIVE 07/12/2022   [place] NEGATIVE 04/03/2022    CBC: No results for input(s): "WBC", "NEUTROABS", "HGB", "HCT", "MCV", "PLT" in the last 168  hours. Cardiac Enzymes: No results for input(s): "CKTOTAL", "CKMB", "CKMBINDEX", "TROPONINI" in the last 168 hours. BNP (last 3 results) No results for input(s): "PROBNP" in the last 8760 hours. CBG: No results for input(s): "GLUCAP" in the last 168 hours. D-Dimer: No results for input(s): "DDIMER" in the last 72 hours. Hgb A1c: No results for input(s): "HGBA1C" in the last 72 hours. Lipid Profile: No results for input(s): "CHOL", "HDL", "LDLCALC", "TRIG", "CHOLHDL", "LDLDIRECT" in the last 72 hours. Thyroid function studies: No results for input(s): "TSH", "T4TOTAL", "T3FREE", "THYROIDAB" in the last 72 hours.  Invalid input(s): "FREET3" Anemia work up: No results for input(s): "VITAMINB12", "FOLATE", "FERRITIN", "TIBC", "IRON", "RETICCTPCT" in the last 72 hours. Sepsis Labs: No results for input(s): "PROCALCITON", "WBC", "LATICACIDVEN" in the last 168 hours. Microbiology No results found for this or any previous visit (from the past 240 hour(s)).   Medications:    amLODipine  10 mg Oral Daily   vitamin C  1,000 mg Oral Daily   collagenase   Topical Daily   docusate sodium  100 mg Oral Daily   folic acid  1 mg Oral Daily   furosemide  40 mg Oral Daily   gabapentin  900 mg Oral TID   isosorbide mononitrate  15 mg Oral Daily   melatonin  3 mg Oral QHS   mometasone-formoterol  2 puff Inhalation BID   morphine  30 mg Oral Q12H   multivitamin with minerals  1 tablet Oral Daily   pantoprazole  40 mg Oral Daily   thiamine  100 mg Oral Daily   Continuous Infusions:  magnesium sulfate bolus IVPB        LOS: 65 days   Charlynne Cousins  [facility]  02/10/2023, 8:35 AM

## 2023-02-11 DIAGNOSIS — I10 Essential (primary) hypertension: Secondary | ICD-10-CM | POA: Diagnosis not present

## 2023-02-11 DIAGNOSIS — S81809A Unspecified open wound, unspecified lower leg, initial encounter: Secondary | ICD-10-CM | POA: Diagnosis not present

## 2023-02-11 DIAGNOSIS — J449 Chronic obstructive pulmonary disease, unspecified: Secondary | ICD-10-CM | POA: Diagnosis not present

## 2023-02-11 DIAGNOSIS — I96 Gangrene, not elsewhere classified: Secondary | ICD-10-CM | POA: Diagnosis not present

## 2023-02-11 NOTE — Plan of Care (Signed)
  Problem: Fluid Volume: Goal: Hemodynamic stability will improve Outcome: Progressing   Problem: Education: Goal: Knowledge of General Education information will improve Description: Including pain rating scale, medication(s)/side effects and non-pharmacologic comfort measures 02/11/2023 2054 by Guinevere Scarlet, RN Outcome: Progressing 02/11/2023 2054 by Guinevere Scarlet, RN Outcome: Progressing   Problem: Health Behavior/Discharge Planning: Goal: Ability to manage health-related needs will improve 02/11/2023 2054 by Guinevere Scarlet, RN Outcome: Progressing 02/11/2023 2054 by Guinevere Scarlet, RN Outcome: Progressing

## 2023-02-11 NOTE — Progress Notes (Addendum)
CSW spoke with patient at bedside. Patient's friend present but patient gave permission to continue conversation with him present. Patient states he wants CSW to speak with Hinton Dyer Daughtry today to determine if there are any updates available regarding housing.  CSW spoke with Hinton Dyer Daughtry of the [facility] who states he will have his supervisor call patient today.  Madilyn Fireman, MSW, [name] Transitions of Care  Clinical Social Worker II [phone]

## 2023-02-11 NOTE — TOC Progression Note (Signed)
Transition of Care [facility]) - Progression Note    Patient Details  Name: [name] MRN: [id number] Date of Birth: 1966-11-25  Transition of Care [facility]) CM/SW Contact  Joanne Chars, [name] Phone Number: 02/11/2023, 4:18 PM  Clinical Narrative:   CSW spoke with Courtney Paris, city of [place].  Pt is being considered for a housing program, [facility].  They need an FL2.  Pt also needs to talk with a screening person: Nevin Bloodgood: [phone].    Expected Discharge Plan: [place] Barriers to Discharge: Continued Medical Work up, SNF Pending bed offer  Expected Discharge Plan and Services   Discharge Planning Services: CM Consult Post Acute Care Choice: [place] Living arrangements for the past 2 months: Homeless                                       Social Determinants of Health (SDOH) Interventions [place]: Food Insecurity Present (11/24/2022)  Housing: Medium Risk (12/07/2022)  Transportation Needs: Unmet Transportation Needs (12/07/2022)  Utilities: Not At Risk (12/07/2022)  Alcohol Screen: Medium Risk (06/24/2019)  Depression (PHQ2-9): Medium Risk (02/10/2021)  Tobacco Use: High Risk (12/11/2022)    Readmission Risk Interventions    12/09/2022    9:02 AM 12/01/2022    3:56 PM 08/18/2022    9:00 AM  Readmission Risk Prevention Plan  Transportation Screening Complete Complete Complete  Medication Review Press photographer) Complete Complete Complete  PCP or Specialist appointment within 3-5 days of discharge Complete Complete Complete  HRI or Home Care Consult Complete Complete Complete  SW Recovery Care/Counseling Consult Complete Complete Complete  Palliative Care Screening Not Applicable Not Applicable Not [place] Not Applicable Not Applicable Not Applicable  SNF Comments  n/a

## 2023-02-11 NOTE — Progress Notes (Signed)
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  Currently awaiting bed at skilled nursing facility.   Subjective   Patient seen and examined, no new complaints.   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 every 12 hours -Awaiting skilled nursing facility placement  Hypertension -Continue amlodipine -Blood pressure is well-controlled  Chronic combined systolic and diastolic CHF -Appears well compensated -Has been refusing Lasix; will hold Lasix at this time  COPD -Stable -Continue as needed inhalers  Anxiety -Continue Atarax as needed  GERD -Continue pantoprazole    Medications     amLODipine  10 mg Oral Daily   vitamin C  1,000 mg Oral Daily   collagenase   Topical Daily   furosemide  40 mg Oral Daily   gabapentin  900 mg Oral TID   isosorbide mononitrate  15 mg Oral Daily   melatonin  3 mg Oral QHS   mometasone-formoterol  2 puff Inhalation BID   morphine  30 mg Oral Q12H   multivitamin with minerals  1 tablet Oral Daily   pantoprazole  40 mg Oral Daily   thiamine  100 mg Oral Daily     Data Reviewed:   CBG:  No results for input(s): "GLUCAP" in the last 168 hours.  SpO2: 97 % O2 Flow Rate (L/min): 2 L/min    Vitals:   02/10/23 2006 02/11/23 0429 02/11/23 0433 02/11/23 0800  BP:   (!) 118/98 (!) 123/90  Pulse:   75 72  Resp:   18   Temp:   97.8 F (36.6 C)  97.8 F (36.6 C)  TempSrc:   Oral Oral  SpO2: 94%  97% 97%  Weight:  89.8 kg    Height:          Data Reviewed:  Basic Metabolic Panel: No results for input(s): "NA", "K", "CL", "CO2", "GLUCOSE", "BUN", "CREATININE", "CALCIUM", "MG", "PHOS" in the last 168 hours.  CBC: No results for input(s): "WBC", "NEUTROABS", "HGB", "HCT", "MCV", "PLT" in the last 168 hours.  LFT No results for input(s): "AST", "ALT", "ALKPHOS", "BILITOT", "PROT", "ALBUMIN" in the last 168 hours.   Antibiotics: Anti-infectives (From admission, onward)    Start     Dose/Rate Route Frequency Ordered Stop   12/26/22 2200  doxycycline (VIBRA-TABS) tablet 100 mg        100 mg Oral Every 12 hours 12/26/22 1307 01/08/23 2126   12/24/22 1000  doxycycline (VIBRA-TABS) tablet 100 mg  Status:  Discontinued        100 mg Oral Every 12 hours 12/24/22 0715 12/26/22 1307   12/10/22 1830  ceFAZolin (ANCEF) IVPB 2g/100 mL premix  Status:  Discontinued        2 g 200 mL/hr over 30 Minutes Intravenous Every 8 hours 12/10/22 1731 12/10/22 1740   12/10/22 1330  ceFAZolin (ANCEF) IVPB 2g/100 mL premix        2 g 200 mL/hr over 30 Minutes Intravenous On call to O.R. 12/10/22 [id number] 12/10/22 1534  12/08/22 0530  vancomycin (VANCOREADY) IVPB 1500 mg/300 mL        1,500 mg 150 mL/hr over 120 Minutes Intravenous Every 12 hours 12/07/22 1741 12/11/22 0955   12/08/22 0345  metroNIDAZOLE (FLAGYL) IVPB 500 mg        500 mg 100 mL/hr over 60 Minutes Intravenous Every 12 hours 12/07/22 1742 12/12/22 0344   12/07/22 2330  ceFEPIme (MAXIPIME) 2 g in sodium chloride 0.9 % 100 mL IVPB        2 g 200 mL/hr over 30 Minutes Intravenous Every 8 hours 12/07/22 1741 12/11/22 1529   12/07/22 2200  metroNIDAZOLE (FLAGYL) IVPB 500 mg  Status:  Discontinued        500 mg 100 mL/hr over 60 Minutes Intravenous Every 12 hours 12/07/22 2048 12/07/22 2056   12/07/22 1530  ceFEPIme (MAXIPIME) 2 g in sodium chloride 0.9 % 100 mL IVPB        2 g 200  mL/hr over 30 Minutes Intravenous  Once 12/07/22 1516 12/07/22 1734   12/07/22 1530  vancomycin (VANCOREADY) IVPB 1500 mg/300 mL        1,500 mg 150 mL/hr over 120 Minutes Intravenous  Once 12/07/22 1516 12/09/22 0333   12/07/22 1515  metroNIDAZOLE (FLAGYL) IVPB 500 mg        500 mg 100 mL/hr over 60 Minutes Intravenous  Once 12/07/22 1506 12/07/22 1734   12/07/22 1500  ceFAZolin (ANCEF) IVPB 1 g/50 mL premix  Status:  Discontinued        1 g 100 mL/hr over 30 Minutes Intravenous  Once 12/07/22 1452 12/07/22 1513        DVT prophylaxis: Lovenox  Code Status: Full code  Family Communication: No family at bedside   CONSULTS    Objective    Physical Examination:   General-appears in no acute distress Heart-S1-S2, regular, no murmur auscultated Lungs-clear to auscultation bilaterally, no wheezing or crackles auscultated Abdomen-soft, nontender, no organomegaly Extremities-left BKA Neuro-alert, oriented x3, no focal deficit noted  Status is: Inpatient:          [place]   [facility] If 7PM-7AM, please contact night-coverage at [email or web address], Office  [phone]   02/11/2023, 6:45 PM  LOS: 66 days

## 2023-02-12 DIAGNOSIS — F250 Schizoaffective disorder, bipolar type: Secondary | ICD-10-CM

## 2023-02-12 DIAGNOSIS — J449 Chronic obstructive pulmonary disease, unspecified: Secondary | ICD-10-CM | POA: Diagnosis not present

## 2023-02-12 DIAGNOSIS — I96 Gangrene, not elsewhere classified: Secondary | ICD-10-CM | POA: Diagnosis not present

## 2023-02-12 MED ORDER — ALBUTEROL SULFATE (2.5 MG/3ML) 0.083% IN NEBU: 2.5000 mg | INHALATION_SOLUTION | RESPIRATORY_TRACT | Status: DC | PRN

## 2023-02-12 NOTE — Progress Notes (Addendum)
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  Currently awaiting bed at skilled nursing facility.   Subjective   Patient seen and examined, no new complaints.   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 every 12 hours -Awaiting skilled nursing facility placement  Hypertension -Continue amlodipine -Blood pressure is well-controlled  Chronic combined systolic and diastolic CHF -Appears well compensated -Has been refusing Lasix; will hold Lasix at this time  COPD -Stable -Continue as needed inhalers  Anxiety -Continue Atarax as needed  GERD -Continue pantoprazole    Medications     amLODipine  10 mg Oral Daily   vitamin C  1,000 mg Oral Daily   collagenase   Topical Daily   furosemide  40 mg Oral Daily   gabapentin  900 mg Oral TID   isosorbide mononitrate  15 mg Oral Daily   melatonin  3 mg Oral QHS   mometasone-formoterol  2 puff Inhalation BID   morphine  30 mg Oral Q12H   multivitamin with minerals  1 tablet Oral Daily   pantoprazole  40 mg Oral Daily   thiamine  100 mg Oral Daily     Data Reviewed:   CBG:  No results for input(s): "GLUCAP" in the last 168 hours.  SpO2: 96 % O2 Flow Rate (L/min): 2 L/min    Vitals:   02/11/23 0800 02/11/23 2023 02/12/23 0812 02/12/23 0822  BP: (!) 123/90 126/78 (!) 142/67 (!) 142/67  Pulse: 72 69  71  Resp:  17    Temp: 97.8  F (36.6 C) 98.3 F (36.8 C)  98 F (36.7 C)  TempSrc: Oral Oral  Oral  SpO2: 97% 96%  96%  Weight:      Height:          Data Reviewed:  Basic Metabolic Panel: No results for input(s): "NA", "K", "CL", "CO2", "GLUCOSE", "BUN", "CREATININE", "CALCIUM", "MG", "PHOS" in the last 168 hours.  CBC: No results for input(s): "WBC", "NEUTROABS", "HGB", "HCT", "MCV", "PLT" in the last 168 hours.  LFT No results for input(s): "AST", "ALT", "ALKPHOS", "BILITOT", "PROT", "ALBUMIN" in the last 168 hours.   Antibiotics: Anti-infectives (From admission, onward)    Start     Dose/Rate Route Frequency Ordered Stop   12/26/22 2200  doxycycline (VIBRA-TABS) tablet 100 mg        100 mg Oral Every 12 hours 12/26/22 1307 01/08/23 2126   12/24/22 1000  doxycycline (VIBRA-TABS) tablet 100 mg  Status:  Discontinued        100 mg Oral Every 12 hours 12/24/22 0715 12/26/22 1307   12/10/22 1830  ceFAZolin (ANCEF) IVPB 2g/100 mL premix  Status:  Discontinued        2 g 200 mL/hr over 30 Minutes Intravenous Every 8 hours 12/10/22 1731 12/10/22 1740   12/10/22 1330  ceFAZolin (ANCEF) IVPB 2g/100 mL premix        2 g 200 mL/hr over 30 Minutes Intravenous On call to O.R. 12/10/22 [id number] 12/10/22  1534   12/08/22 0530  vancomycin (VANCOREADY) IVPB 1500 mg/300 mL        1,500 mg 150 mL/hr over 120 Minutes Intravenous Every 12 hours 12/07/22 1741 12/11/22 0955   12/08/22 0345  metroNIDAZOLE (FLAGYL) IVPB 500 mg        500 mg 100 mL/hr over 60 Minutes Intravenous Every 12 hours 12/07/22 1742 12/12/22 0344   12/07/22 2330  ceFEPIme (MAXIPIME) 2 g in sodium chloride 0.9 % 100 mL IVPB        2 g 200 mL/hr over 30 Minutes Intravenous Every 8 hours 12/07/22 1741 12/11/22 1529   12/07/22 2200  metroNIDAZOLE (FLAGYL) IVPB 500 mg  Status:  Discontinued        500 mg 100 mL/hr over 60 Minutes Intravenous Every 12 hours 12/07/22 2048 12/07/22 2056   12/07/22 1530  ceFEPIme (MAXIPIME) 2 g in sodium chloride 0.9 % 100  mL IVPB        2 g 200 mL/hr over 30 Minutes Intravenous  Once 12/07/22 1516 12/07/22 1734   12/07/22 1530  vancomycin (VANCOREADY) IVPB 1500 mg/300 mL        1,500 mg 150 mL/hr over 120 Minutes Intravenous  Once 12/07/22 1516 12/09/22 0333   12/07/22 1515  metroNIDAZOLE (FLAGYL) IVPB 500 mg        500 mg 100 mL/hr over 60 Minutes Intravenous  Once 12/07/22 1506 12/07/22 1734   12/07/22 1500  ceFAZolin (ANCEF) IVPB 1 g/50 mL premix  Status:  Discontinued        1 g 100 mL/hr over 30 Minutes Intravenous  Once 12/07/22 1452 12/07/22 1513        DVT prophylaxis: Lovenox  Code Status: Full code  Family Communication: No family at bedside   CONSULTS    Objective    Physical Examination:   General-appears in no acute distress Heart-S1-S2, regular, no murmur auscultated Lungs-clear to auscultation bilaterally, no wheezing or crackles auscultated Abdomen-soft, nontender, no organomegaly Extremities-left BKA Neuro-alert, oriented x3, no focal deficit noted  Status is: Inpatient:          [place]   [facility] If 7PM-7AM, please contact night-coverage at [email or web address], Office  [phone]   02/12/2023, 11:29 AM  LOS: 67 days

## 2023-02-12 NOTE — NC FL2 (Signed)
[place] LEVEL OF CARE FORM     IDENTIFICATION  Patient Name: [name] Birthdate: 04-14-1966 Sex: male Admission Date (Current Location): 12/07/2022  [facility] and [place] Number:  Herbalist and Address:  [facility] [place], [place] [address] [place], [place] [address]      Provider Number: [id number]  Attending Physician Name and Address:  [name], MD  Relative Name and Phone Number:       Current Level of Care: Hospital Recommended Level of Care: [place] Prior Approval Number:    Date Approved/Denied:   PASRR Number: PENDING  Discharge Plan: Other (Comment) ([place])    Current Diagnoses: Patient Active Problem List   Diagnosis Date Noted   Gangrene of left foot ([place]) 12/09/2022   Cellulitis of left lower extremity 12/07/2022   COPD (chronic obstructive pulmonary disease) ([place]) 11/25/2022   Pulmonary emboli ([place]) 08/17/2022   Venous ulcer ([place]) 07/23/2022   Sinus bradycardia 07/23/2022   Anemia of chronic disease 07/23/2022   S/P AKA (above knee amputation), right ([place]) 07/23/2022   Acute on chronic systolic congestive heart failure ([place]) 07/12/2022   History of DVT (deep vein thrombosis) 07/12/2022   CAP (community acquired pneumonia) 07/12/2022   Chronic venous hypertension (idiopathic) with ulcer and inflammation of bilateral lower extremity ([place])    Sepsis due to cellulitis ([place]) 04/04/2022   Normocytic anemia 04/04/2022   Acute deep vein thrombosis (DVT) of brachial vein ([place]) 03/31/2022   Swelling of lower extremity 03/23/2022   SIRS (systemic inflammatory response syndrome) ([place]) 03/23/2022   Leg swelling 03/23/2022   Acute on chronic systolic heart failure ([place]) 02/26/2022   Seizure ([place]) 02/26/2022   Venous stasis ulcer of right ankle limited to breakdown of skin with varicose veins ([place])    Hypertensive urgency 01/29/2022   Prolonged QT interval 01/29/2022   Alcohol  abuse 01/29/2022   chronic ulcers 01/29/2022   Wound infection 12/04/2021   Open wounds involving multiple regions of lower extremity 12/03/2021   Mixed diabetic hyperlipidemia associated with type 2 diabetes mellitus ([place]) 12/03/2021   Cellulitis of right lower extremity 11/13/2021   DM2 (diabetes mellitus, type 2) (Beaulieu) 11/13/2021   Antisocial personality disorder (Bel[place]) 11/04/2021   Malingering 11/04/2021   Foot pain 04/23/2021   Chronic combined systolic and diastolic congestive heart failure ([place]) 03/02/2021   History of stroke 03/02/2021   Polysubstance abuse ([place]) 03/02/2021   Skin ulcer of right great toe with necrosis of bone ([place])    Encounter for assessment of healthcare decision-making capacity    Pain, dental    Dystonia    Dysarthria    Shock liver [id number]   Acute metabolic encephalopathy [id number]   Coronary artery disease involving native coronary artery of native heart without angina pectoris 01/04/2021   Essential hypertension 01/04/2021   Bipolar I disorder (Bock) 01/04/2021   Cocaine abuse ([place]) 01/04/2021   Homelessness 01/04/2021   HLD (hyperlipidemia) 01/04/2021   Osteomyelitis of great toe (HCC)    Ulcer of great toe, left, with necrosis of bone (HCC)    Ulcer of great toe, right, with necrosis of muscle (HCC)    Acute osteomyelitis of toe, left (HCC)    Acute on chronic combined systolic and diastolic CHF (congestive heart failure) ([place]) 12/20/2020   Respiratory failure ([place])    Ischemic stroke ([place]) 12/17/2020   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   Diabetic foot ulcer ([place]) 12/08/2020   CHF (congestive heart failure) (  [place]) 11/14/2020   CHF exacerbation ([place]burg) 11/13/2020   Hypokalemia    Hyperlipidemia    Acute hypoxemic respiratory failure ([place] Creek) 10/26/2020   COPD with acute exacerbation ([place]) 10/14/2020   Acute on chronic combined systolic and diastolic CHF (congestive heart failure) ([place] River) 10/02/2020   Heart failure with reduced ejection fraction  and diastolic dysfunction ([place] Creek) 09/07/2020   Near syncope 07/21/2020   Acid reflux 06/03/2020   Cannabis abuse 03/19/2020   Acute bronchitis due to Rhinovirus 09/08/2019   Schizoaffective disorder ([place]) 06/24/2019   SOB (shortness of breath)    QT prolongation 05/25/2019   Cocaine abuse ([place])    Elevated troponin    Homeless    Suicidal ideation    Bilateral lower extremity pain 04/05/2019   Housing situation unstable 03/28/2019   Bilateral lower extremity edema    Acute exacerbation of CHF (congestive heart failure) (Selma) 03/04/2019   Alcohol withdrawal syndrome, with delirium ([place]ajardo) 05/03/2018   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[place]) 03/02/2018   Mild intermittent asthma without complication [id number]   Asthma 12/26/2016   Tobacco use disorder 12/26/2016   Schizophrenia ([place]) 12/26/2016   Severe episode of recurrent major depressive disorder, with psychotic features ([place]lake) 10/31/2016   Cocaine abuse with cocaine-induced mood disorder (Harrisburg) 09/06/2016   Alcohol use disorder, mild, abuse 07/31/2016   Substance induced mood disorder ([place]y) 07/30/2016   Schizoaffective disorder, bipolar type (Gardner) 05/31/2016   Cocaine use disorder, severe, dependence ([place]) 05/31/2016   Major depressive disorder, recurrent episode ([place]ar[place]) 05/30/2016   Suicidal behavior with attempted self-injury ([place]) 05/08/2016   Acetaminophen overdose 05/06/2016    Orientation RESPIRATION BLADDER Height & Weight     Self, Time, Situation, Place  Normal Continent Weight: 197 lb 15.6 oz (89.8 kg) Height:  '5\' 9"'$  (175.3 cm)  BEHAVIORAL SYMPTOMS/MOOD NEUROLOGICAL BOWEL NUTRITION STATUS      Continent Diet (See discharge summary)  AMBULATORY STATUS COMMUNICATION OF NEEDS Skin   Extensive Assist Verbally Normal                       Personal Care Assistance Level of Assistance  Bathing, Feeding, Dressing Bathing Assistance: Limited assistance Feeding assistance: Independent Dressing  Assistance: Limited assistance     Functional Limitations Info  Sight, Hearing, Speech Sight Info: Adequate Hearing Info: Adequate Speech Info: Adequate    SPECIAL CARE FACTORS FREQUENCY  PT (By licensed PT), OT (By licensed OT)                    Contractures Contractures Info: Not present    Additional Factors Info  Code Status, Allergies Code Status Info: Full Code Allergies Info: Motrin (Ibuprofen)  Tylenol (Acetaminophen)  Lyrica Cr (Pregabalin Er)  Peanut-containing Drug Products           Current Medications (02/12/2023):  This is the current hospital active medication list Current Facility-Administered Medications  Medication Dose Route Frequency Provider Last Rate Last Admin   albuterol (PROVENTIL) (2.5 MG/3ML) 0.083% nebulizer solution 2.5 mg  2.5 mg Nebulization Q6H PRN Newt Minion, MD   2.5 mg at 01/06/23 0642   alum & mag hydroxide-simeth (MAALOX/MYLANTA) 200-200-20 MG/5ML suspension 15-30 mL  15-30 mL Oral Q2H PRN Newt Minion, MD       amLODipine (NORVASC) tablet 10 mg  10 mg Oral Daily Newt Minion, MD   10 mg at 02/12/23 [id number]   ascorbic acid (VITAMIN C) tablet 1,000 mg  1,000 mg Oral Daily Meridee Score  V, MD   1,000 mg at 02/12/23 0823   benzocaine (ORAJEL) 10 % mucosal gel   Mouth/Throat QID PRN Geradine Girt, DO   Given at 01/25/23 1455   bisacodyl (DULCOLAX) EC tablet 5 mg  5 mg Oral Daily PRN Newt Minion, MD       collagenase (SANTYL) ointment   Topical Daily Donne Hazel, MD   Given at 02/07/23 0941   diphenhydrAMINE (BENADRYL) capsule 25 mg  25 mg Oral Q6H PRN Caren Griffins, MD   25 mg at 01/28/23 2239   furosemide (LASIX) tablet 40 mg  40 mg Oral Daily Donne Hazel, MD   40 mg at 02/12/23 [id number]   gabapentin (NEURONTIN) capsule 900 mg  900 mg Oral TID Newt Minion, MD   900 mg at 02/12/23 0817   guaiFENesin-dextromethorphan (ROBITUSSIN DM) 100-10 MG/5ML syrup 15 mL  15 mL Oral Q4H PRN Newt Minion, MD       hydrALAZINE  (APRESOLINE) tablet 25 mg  25 mg Oral Q6H PRN [name], MD       hydrOXYzine (ATARAX) tablet 25 mg  25 mg Oral TID PRN Hosie Poisson, MD   25 mg at 01/14/23 0521   isosorbide mononitrate (IMDUR) 24 hr tablet 15 mg  15 mg Oral Daily Newt Minion, MD   15 mg at 02/12/23 0817   labetalol (NORMODYNE) injection 10 mg  10 mg Intravenous Q10 min PRN Newt Minion, MD       magnesium citrate solution 1 Bottle  1 Bottle Oral Once PRN Newt Minion, MD       magnesium sulfate IVPB 2 g 50 mL  2 g Intravenous Daily PRN Newt Minion, MD       melatonin tablet 3 mg  3 mg Oral QHS Vann, Jessica U, DO   3 mg at 01/18/23 2052   mometasone-formoterol (DULERA) 100-5 MCG/ACT inhaler 2 puff  2 puff Inhalation BID Caren Griffins, MD   2 puff at 02/12/23 0826   morphine (MS CONTIN) 12 hr tablet 30 mg  30 mg Oral Q12H Caren Griffins, MD   30 mg at 02/11/23 2153   multivitamin with minerals tablet 1 tablet  1 tablet Oral Daily Newt Minion, MD   1 tablet at 02/12/23 0817   ondansetron (ZOFRAN) injection 4 mg  4 mg Intravenous Q6H PRN Newt Minion, MD       Oral care mouth rinse  15 mL Mouth Rinse PRN Caren Griffins, MD       oxyCODONE (Oxy IR/ROXICODONE) immediate release tablet 20 mg  20 mg Oral Q4H PRN Dwyane Dee, MD   20 mg at 02/12/23 0821   pantoprazole (PROTONIX) EC tablet 40 mg  40 mg Oral Daily Newt Minion, MD   40 mg at 02/10/23 0818   phenol (CHLORASEPTIC) mouth spray 1 spray  1 spray Mouth/Throat PRN Newt Minion, MD       polyethylene glycol (MIRALAX / GLYCOLAX) packet 17 g  17 g Oral Daily PRN [name], MD       thiamine (VITAMIN B1) tablet 100 mg  100 mg Oral Daily Newt Minion, MD   100 mg at 02/12/23 0818   traMADol (ULTRAM) tablet 50 mg  50 mg Oral Q12H PRN Hosie Poisson, MD   50 mg at 02/10/23 1640     Discharge Medications: Please see discharge summary for a list of discharge medications.  Relevant Imaging Results:  Relevant Lab Results:   Additional  Information SSN: [id number]  Archie Endo, [name]

## 2023-02-12 NOTE — Progress Notes (Signed)
OT Cancellation Note  Patient Details Name: Siddhartha Dorsey MRN: [id number] DOB: [birth year]   Cancelled Treatment:    Reason Eval/Treat Not Completed: Other (comment)  Attempted OT treatment with focus on modified AP/lateral transfer from w/c to [facility]. Pt in bed upon therapy arrival. Verbalized that he was upset that at previous session (cancelled session) he wasn't asked if a student could be present during session. Pt agreed to participate in therapy session although with reluctant demeanor. Requested that pt manage/set-up his w/c at side of bed to complete AP transfer in order to increase his independence and prepare him for independent w/c management in post acute setting. Pt demonstrated apprehensive body language and therapist did not feel that therapy session would be beneficial or skilled if continued.  This OT recommended completing therapy session another day and reassured pt that it was ok to wait until a more convenient time.  Ailene Ravel, OTR/L,CBIS  Supplemental OT - [facility] and [facility] Secure Chat Preferred   02/12/2023, 9:01 AM

## 2023-02-12 NOTE — Plan of Care (Signed)
  Problem: Fluid Volume: Goal: Hemodynamic stability will improve Outcome: Progressing   Problem: Education: Goal: Knowledge of General Education information will improve Description: Including pain rating scale, medication(s)/side effects and non-pharmacologic comfort measures Outcome: Progressing   Problem: Health Behavior/Discharge Planning: Goal: Ability to manage health-related needs will improve Outcome: Progressing   Problem: Clinical Measurements: Goal: Ability to maintain clinical measurements within normal limits will improve Outcome: Progressing Goal: Will remain free from infection Outcome: Progressing Goal: Diagnostic test results will improve Outcome: Progressing Goal: Respiratory complications will improve Outcome: Progressing Goal: Cardiovascular complication will be avoided Outcome: Progressing   Problem: Activity: Goal: Risk for activity intolerance will decrease Outcome: Progressing

## 2023-02-12 NOTE — TOC Progression Note (Addendum)
Transition of Care [facility]) - Progression Note    Patient Details  Name: [name] MRN: [id number] Date of Birth: January 22, 1966  Transition of Care [facility] CM/SW Contact  Joanne Chars, [name] Phone Number: 02/12/2023, 9:39 AM  Clinical Narrative:   Pt reports he already completed the screening with [facility] Spoke to her by phone yesterday.  CSW LM with Nevin Bloodgood to confirm.    1100: TC Paula.  She did speak to [place] and has everything she needs.    Expected Discharge Plan: [place] Barriers to Discharge: Continued Medical Work up, SNF Pending bed offer  Expected Discharge Plan and Services   Discharge Planning Services: CM Consult Post Acute Care Choice: [place] Living arrangements for the past 2 months: Homeless                                       Social Determinants of Health (SDOH) Interventions [place]: Food Insecurity Present (11/24/2022)  Housing: Medium Risk (12/07/2022)  Transportation Needs: Unmet Transportation Needs (12/07/2022)  Utilities: Not At Risk (12/07/2022)  Alcohol Screen: Medium Risk (06/24/2019)  Depression (PHQ2-9): Medium Risk (02/10/2021)  Tobacco Use: High Risk (12/11/2022)    Readmission Risk Interventions    12/09/2022    9:02 AM 12/01/2022    3:56 PM 08/18/2022    9:00 AM  Readmission Risk Prevention Plan  Transportation Screening Complete Complete Complete  Medication Review Press photographer) Complete Complete Complete  PCP or Specialist appointment within 3-5 days of discharge Complete Complete Complete  HRI or Home Care Consult Complete Complete Complete  SW Recovery Care/Counseling Consult Complete Complete Complete  Palliative Care Screening Not Applicable Not Applicable Not [place] Not Applicable Not Applicable Not Applicable  SNF Comments  n/a

## 2023-02-13 ENCOUNTER — Inpatient Hospital Stay (HOSPITAL_COMMUNITY): Payer: Medicaid Other

## 2023-02-13 DIAGNOSIS — F250 Schizoaffective disorder, bipolar type: Secondary | ICD-10-CM | POA: Diagnosis not present

## 2023-02-13 DIAGNOSIS — I96 Gangrene, not elsewhere classified: Secondary | ICD-10-CM | POA: Diagnosis not present

## 2023-02-13 DIAGNOSIS — J449 Chronic obstructive pulmonary disease, unspecified: Secondary | ICD-10-CM | POA: Diagnosis not present

## 2023-02-13 LAB — BASIC METABOLIC PANEL
Anion gap: 10 (ref 5–15)
BUN: 14 mg/dL (ref 6–20)
CO2: 23 mmol/L (ref 22–32)
Calcium: 9 mg/dL (ref 8.9–10.3)
Chloride: 104 mmol/L (ref 98–111)
Creatinine, Ser: 0.89 mg/dL (ref 0.61–1.24)
GFR, Estimated: >60 mL/min (ref >60)
Glucose, Bld: 160 mg/dL — ABNORMAL HIGH (ref 70–99)
Potassium: 3.5 mmol/L (ref 3.5–5.1)
Sodium: 137 mmol/L (ref 135–145)

## 2023-02-13 LAB — BRAIN NATRIURETIC PEPTIDE: B Natriuretic Peptide: 116.5 pg/mL — ABNORMAL HIGH (ref 0.0–100.0)

## 2023-02-13 MED ORDER — FUROSEMIDE 40 MG PO TABS
40.0000 mg | ORAL_TABLET | Freq: Once | ORAL | Status: AC
  Administered 2023-02-13: 40 mg via ORAL
  Filled 2023-02-13: qty 1

## 2023-02-13 NOTE — Plan of Care (Signed)
  Problem: Education: Goal: Knowledge of General Education information will improve Description: Including pain rating scale, medication(s)/side effects and non-pharmacologic comfort measures Outcome: Adequate for Discharge   Problem: Clinical Measurements: Goal: Will remain free from infection Outcome: Adequate for Discharge   Problem: Activity: Goal: Risk for activity intolerance will decrease Outcome: Adequate for Discharge   Problem: Nutrition: Goal: Adequate nutrition will be maintained Outcome: Adequate for Discharge   Problem: Coping: Goal: Level of anxiety will decrease Outcome: Adequate for Discharge   Problem: Elimination: Goal: Will not experience complications related to bowel motility Outcome: Adequate for Discharge   Problem: Pain Managment: Goal: General experience of comfort will improve Outcome: Adequate for Discharge   Problem: Skin Integrity: Goal: Risk for impaired skin integrity will decrease Outcome: Adequate for Discharge

## 2023-02-13 NOTE — Progress Notes (Signed)
CSW spoke with [name] of the [facility] who states patient was deemed eligible for the TCL housing program.  CSW provided [place] with an updated FL2 to assist in the housing process.  Madilyn Fireman, MSW, [name] Transitions of Care  Clinical Social Worker II [phone]

## 2023-02-13 NOTE — Progress Notes (Signed)
[facility]  PROGRESS NOTE  [name] [id number] DOB: [birth year] DOA: 12/07/2022 PCP: Marliss Coots, NP   Brief HPI:    57 year old male with history of chronic combined systolic and diastolic CHF, CAD, hyperlipidemia, peripheral neuropathy, bipolar disorder, homeless presented with lower extremity chronic venous stasis with concern for superimposed infection, was seen by orthopedic surgery, underwent left BKA on 12/10/2022.  Currently awaiting bed at skilled nursing facility.   Subjective   Patient seen and examined, complains of shortness of breath.  No requiring oxygen.   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 every 12 hours -Awaiting skilled nursing facility placement  Hypertension -Continue amlodipine -Blood pressure is well-controlled  Chronic  combined systolic and diastolic CHF -Not requiring oxygen, complains of dyspnea -Lungs are clear to auscultation -Already on Lasix 40 mg p.o. daily, does not want to increase the dose -BNP 116.5 -continue Lasix 40 mg daily  COPD -Stable -Continue as needed inhalers  Anxiety -Continue Atarax as needed  GERD -Continue pantoprazole    Medications     amLODipine  10 mg Oral Daily   vitamin C  1,000 mg Oral Daily   collagenase   Topical Daily   furosemide  40 mg Oral Daily   gabapentin  900 mg Oral TID   isosorbide mononitrate  15 mg Oral Daily   melatonin  3 mg Oral QHS   mometasone-formoterol  2 puff Inhalation BID   morphine  30 mg Oral Q12H   multivitamin with minerals  1 tablet Oral Daily   pantoprazole  40 mg Oral Daily   thiamine  100 mg Oral Daily     Data Reviewed:   CBG:  No results for input(s): "GLUCAP" in the last 168 hours.  SpO2: 96 % O2 Flow Rate (L/min): 2 L/min    Vitals:    02/12/23 0822 02/12/23 1321 02/12/23 2031 02/13/23 0847  BP: (!) 142/67 127/78 129/80   Pulse: 71 72 77   Resp:  16 17   Temp: 98 F (36.7 C) 98.1 F (36.7 C) 98.1 F (36.7 C)   TempSrc: Oral Oral Oral   SpO2: 96% 94% 96% 96%  Weight:      Height:          Data Reviewed:  Basic Metabolic Panel: No results for input(s): "NA", "K", "CL", "CO2", "GLUCOSE", "BUN", "CREATININE", "CALCIUM", "MG", "PHOS" in the last 168 hours.  CBC: No results for input(s): "WBC", "NEUTROABS", "HGB", "HCT", "MCV", "PLT" in the last 168 hours.  LFT No results for input(s): "AST", "ALT", "ALKPHOS", "BILITOT", "PROT", "ALBUMIN" in the last 168 hours.   Antibiotics: Anti-infectives (From admission, onward)    Start     Dose/Rate Route Frequency Ordered Stop   12/26/22 2200  doxycycline (VIBRA-TABS) tablet 100 mg        100 mg Oral Every 12 hours 12/26/22 1307 01/08/23 2126   12/24/22 1000  doxycycline (VIBRA-TABS) tablet 100 mg  Status:  Discontinued        100 mg Oral Every 12 hours 12/24/22 0715 12/26/22 1307   12/10/22 1830  ceFAZolin (ANCEF) IVPB 2g/100 mL premix  Status:  Discontinued        2 g 200 mL/hr over 30 Minutes Intravenous Every 8 hours 12/10/22 1731 12/10/22 1740   12/10/22 1330  ceFAZolin (ANCEF) IVPB 2g/100  mL premix        2 g 200 mL/hr over 30 Minutes Intravenous On call to O.R. 12/10/22 0959 12/10/22 1534   12/08/22 0530  vancomycin (VANCOREADY) IVPB 1500 mg/300 mL        1,500 mg 150 mL/hr over 120 Minutes Intravenous Every 12 hours 12/07/22 1741 12/11/22 0955   12/08/22 0345  metroNIDAZOLE (FLAGYL) IVPB 500 mg        500 mg 100 mL/hr over 60 Minutes Intravenous Every 12 hours 12/07/22 1742 12/12/22 0344   12/07/22 2330  ceFEPIme (MAXIPIME) 2 g in sodium chloride 0.9 % 100 mL IVPB        2 g 200 mL/hr over 30 Minutes Intravenous Every 8 hours 12/07/22 1741 12/11/22 1529   12/07/22 2200  metroNIDAZOLE (FLAGYL) IVPB 500 mg  Status:  Discontinued        500 mg 100 mL/hr  over 60 Minutes Intravenous Every 12 hours 12/07/22 2048 12/07/22 2056   12/07/22 1530  ceFEPIme (MAXIPIME) 2 g in sodium chloride 0.9 % 100 mL IVPB        2 g 200 mL/hr over 30 Minutes Intravenous  Once 12/07/22 1516 12/07/22 1734   12/07/22 1530  vancomycin (VANCOREADY) IVPB 1500 mg/300 mL        1,500 mg 150 mL/hr over 120 Minutes Intravenous  Once 12/07/22 1516 12/09/22 0333   12/07/22 1515  metroNIDAZOLE (FLAGYL) IVPB 500 mg        500 mg 100 mL/hr over 60 Minutes Intravenous  Once 12/07/22 1506 12/07/22 1734   12/07/22 1500  ceFAZolin (ANCEF) IVPB 1 g/50 mL premix  Status:  Discontinued        1 g 100 mL/hr over 30 Minutes Intravenous  Once 12/07/22 1452 12/07/22 1513        DVT prophylaxis: Lovenox  Code Status: Full code  Family Communication: No family at bedside   CONSULTS    Objective    Physical Examination:  General-appears in no acute distress Heart-S1-S2, regular, no murmur auscultated Lungs-clear to auscultation bilaterally, no wheezing or crackles auscultated Abdomen-soft, nontender, no organomegaly Extremities-no edema in the lower extremities Neuro-alert, oriented x3, no focal deficit noted  Status is: Inpatient:          [name]   [facility] If 7PM-7AM, please contact night-coverage at [email or web address], Office  [phone]   02/13/2023, 9:38 AM  LOS: 68 days

## 2023-02-14 DIAGNOSIS — F250 Schizoaffective disorder, bipolar type: Secondary | ICD-10-CM | POA: Diagnosis not present

## 2023-02-14 DIAGNOSIS — J449 Chronic obstructive pulmonary disease, unspecified: Secondary | ICD-10-CM | POA: Diagnosis not present

## 2023-02-14 DIAGNOSIS — I96 Gangrene, not elsewhere classified: Secondary | ICD-10-CM | POA: Diagnosis not present

## 2023-02-14 MED ORDER — POTASSIUM CHLORIDE CRYS ER 20 MEQ PO TBCR
40.0000 meq | EXTENDED_RELEASE_TABLET | Freq: Once | ORAL | Status: AC
  Administered 2023-02-14: 40 meq via ORAL
  Filled 2023-02-14: qty 2

## 2023-02-14 MED ORDER — FUROSEMIDE 40 MG PO TABS
40.0000 mg | ORAL_TABLET | Freq: Once | ORAL | Status: AC
  Administered 2023-02-14: 40 mg via ORAL
  Filled 2023-02-14: qty 1

## 2023-02-14 NOTE — Progress Notes (Addendum)
Mobility Specialist Progress Note    02/14/23 1455  Mobility  Activity  (wheelchair mobility)  Level of Assistance Modified independent, requires aide device or extra time  Assistive Device Wheelchair  Distance Ambulated (ft) 500 ft (propelled)  RLE Weight Bearing NWB  LLE Weight Bearing NWB  Activity Response Tolerated well  Mobility Referral Yes  $Mobility charge 1 Mobility   Pt received in room and agreeable. No complaints. Returned to room with call bell in reach.   Hildred Alamin Mobility Specialist  Please Psychologist, sport and exercise or Rehab Office at [phone]

## 2023-02-14 NOTE — Plan of Care (Signed)
  Problem: Fluid Volume: Goal: Hemodynamic stability will improve Outcome: Progressing   Problem: Education: Goal: Knowledge of General Education information will improve Description: Including pain rating scale, medication(s)/side effects and non-pharmacologic comfort measures Outcome: Progressing   Problem: Health Behavior/Discharge Planning: Goal: Ability to manage health-related needs will improve Outcome: Progressing   Problem: Clinical Measurements: Goal: Ability to maintain clinical measurements within normal limits will improve Outcome: Progressing

## 2023-02-14 NOTE — Progress Notes (Signed)
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  Currently awaiting bed at skilled nursing facility.   Subjective   Seen and examined, complaint of shortness of breath yesterday.  Chest x-ray showed mild congestive heart failure.  BNP was only 116.  Diuresed almost 4 L with extra dose of Lasix yesterday.  Breathing better this morning.   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 every 12 hours -Awaiting skilled nursing facility placement  Hypertension -Continue amlodipine -Blood pressure is well-controlled  Acute on chronic  combined systolic and diastolic CHF -Not requiring oxygen, complains of dyspnea -Lungs are clear to auscultation -Chest x-ray showed mild congestive heart failure, BNP 116 -Received Lasix 40 mg p.o. twice daily yesterday, diuresed almost 4 L yesterday -Will give additional dose of Lasix 40 mg daily in the evening  -Will give 1 dose of potassium, K-Dur 40 mEq p.o. as potassium was 3.5 -Follow BMP in am  COPD -Stable -Continue as needed inhalers  Anxiety -Continue Atarax as needed  GERD -Continue pantoprazole    Medications     amLODipine  10 mg Oral Daily   vitamin C  1,000 mg Oral Daily   collagenase   Topical Daily   furosemide  40 mg Oral Daily   gabapentin  900 mg Oral TID   isosorbide mononitrate  15 mg Oral Daily   melatonin  3 mg Oral QHS    mometasone-formoterol  2 puff Inhalation BID   morphine  30 mg Oral Q12H   multivitamin with minerals  1 tablet Oral Daily   pantoprazole  40 mg Oral Daily   thiamine  100 mg Oral Daily     Data Reviewed:   CBG:  No results for input(s): "GLUCAP" in the last 168 hours.  SpO2: 96 % O2 Flow Rate (L/min): 2 L/min    Vitals:   02/13/23 1518 02/13/23 2041 02/14/23 0500 02/14/23 0835  BP: 128/78 127/72 (!) 140/95   Pulse: 61 71 77   Resp: 16 18 18    Temp: 98.4 F (36.9 C) 98.6 F (37 C)    TempSrc: Oral Oral    SpO2: 98% 95% 95% 96%  Weight:      Height:          Data Reviewed:  Basic Metabolic Panel: Recent Labs  Lab 02/13/23 1201  NA 137  K 3.5  CL 104  CO2 23  GLUCOSE 160*  BUN 14  CREATININE 0.89  CALCIUM 9.0    CBC: No results for input(s): "WBC", "NEUTROABS", "HGB", "HCT", "MCV", "PLT" in the last 168 hours.  LFT No results for input(s): "AST", "ALT", "ALKPHOS", "BILITOT", "PROT", "ALBUMIN" in the last 168 hours.   Antibiotics: Anti-infectives (From admission, onward)    Start     Dose/Rate Route Frequency Ordered Stop   12/26/22 2200  doxycycline (VIBRA-TABS) tablet 100 mg        100 mg Oral Every 12 hours 12/26/22 1307 01/08/23 2126   12/24/22 1000  doxycycline (VIBRA-TABS) tablet  100 mg  Status:  Discontinued        100 mg Oral Every 12 hours 12/24/22 0715 12/26/22 1307   12/10/22 1830  ceFAZolin (ANCEF) IVPB 2g/100 mL premix  Status:  Discontinued        2 g 200 mL/hr over 30 Minutes Intravenous Every 8 hours 12/10/22 1731 12/10/22 1740   12/10/22 1330  ceFAZolin (ANCEF) IVPB 2g/100 mL premix        2 g 200 mL/hr over 30 Minutes Intravenous On call to O.R. 12/10/22 0959 12/10/22 1534   12/08/22 0530  vancomycin (VANCOREADY) IVPB 1500 mg/300 mL        1,500 mg 150 mL/hr over 120 Minutes Intravenous Every 12 hours 12/07/22 1741 12/11/22 0955   12/08/22 0345  metroNIDAZOLE (FLAGYL) IVPB 500 mg        500 mg 100 mL/hr over 60 Minutes  Intravenous Every 12 hours 12/07/22 1742 12/12/22 0344   12/07/22 2330  ceFEPIme (MAXIPIME) 2 g in sodium chloride 0.9 % 100 mL IVPB        2 g 200 mL/hr over 30 Minutes Intravenous Every 8 hours 12/07/22 1741 12/11/22 1529   12/07/22 2200  metroNIDAZOLE (FLAGYL) IVPB 500 mg  Status:  Discontinued        500 mg 100 mL/hr over 60 Minutes Intravenous Every 12 hours 12/07/22 2048 12/07/22 2056   12/07/22 1530  ceFEPIme (MAXIPIME) 2 g in sodium chloride 0.9 % 100 mL IVPB        2 g 200 mL/hr over 30 Minutes Intravenous  Once 12/07/22 1516 12/07/22 1734   12/07/22 1530  vancomycin (VANCOREADY) IVPB 1500 mg/300 mL        1,500 mg 150 mL/hr over 120 Minutes Intravenous  Once 12/07/22 1516 12/09/22 0333   12/07/22 1515  metroNIDAZOLE (FLAGYL) IVPB 500 mg        500 mg 100 mL/hr over 60 Minutes Intravenous  Once 12/07/22 1506 12/07/22 1734   12/07/22 1500  ceFAZolin (ANCEF) IVPB 1 g/50 mL premix  Status:  Discontinued        1 g 100 mL/hr over 30 Minutes Intravenous  Once 12/07/22 1452 12/07/22 1513        DVT prophylaxis: Lovenox  Code Status: Full code  Family Communication: No family at bedside   CONSULTS    Objective    Physical Examination:  General-appears in no acute distress Heart-S1-S2, regular, no murmur auscultated Lungs-clear to auscultation bilaterally, no wheezing or crackles auscultated Abdomen-soft, nontender, no organomegaly Extremities-generalized anasarca noted Neuro-alert, oriented x3, no focal deficit noted  Status is: Inpatient:          [place]   [facility] If 7PM-7AM, please contact night-coverage at [email or web address], Office  [phone]   02/14/2023, 11:38 AM  LOS: 69 days

## 2023-02-15 DIAGNOSIS — F250 Schizoaffective disorder, bipolar type: Secondary | ICD-10-CM | POA: Diagnosis not present

## 2023-02-15 DIAGNOSIS — I96 Gangrene, not elsewhere classified: Secondary | ICD-10-CM | POA: Diagnosis not present

## 2023-02-15 DIAGNOSIS — J449 Chronic obstructive pulmonary disease, unspecified: Secondary | ICD-10-CM | POA: Diagnosis not present

## 2023-02-15 LAB — BASIC METABOLIC PANEL
Anion gap: 11 (ref 5–15)
BUN: 19 mg/dL (ref 6–20)
CO2: 19 mmol/L — ABNORMAL LOW (ref 22–32)
Calcium: 9 mg/dL (ref 8.9–10.3)
Chloride: 107 mmol/L (ref 98–111)
Creatinine, Ser: 1.07 mg/dL (ref 0.61–1.24)
GFR, Estimated: >60 mL/min (ref >60)
Glucose, Bld: 109 mg/dL — ABNORMAL HIGH (ref 70–99)
Potassium: 4 mmol/L (ref 3.5–5.1)
Sodium: 137 mmol/L (ref 135–145)

## 2023-02-15 MED ORDER — FUROSEMIDE 40 MG PO TABS
40.0000 mg | ORAL_TABLET | Freq: Once | ORAL | Status: AC
  Administered 2023-02-15: 40 mg via ORAL
  Filled 2023-02-15: qty 1

## 2023-02-15 NOTE — Plan of Care (Signed)
  Problem: Education: Goal: Knowledge of General Education information will improve Description: Including pain rating scale, medication(s)/side effects and non-pharmacologic comfort measures Outcome: Completed/Met   Problem: Health Behavior/Discharge Planning: Goal: Ability to manage health-related needs will improve Outcome: Completed/Met   Problem: Clinical Measurements: Goal: Will remain free from infection Outcome: Completed/Met Goal: Diagnostic test results will improve Outcome: Completed/Met   

## 2023-02-15 NOTE — Progress Notes (Signed)
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  Currently awaiting bed at skilled nursing facility.   Subjective   Patient seen, diuresing well with Lasix.  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 every 12 hours -Awaiting skilled nursing facility placement  Hypertension -Continue amlodipine -Blood pressure is well-controlled  Acute on chronic  combined systolic and diastolic CHF -Not requiring oxygen, complains of dyspnea -Lungs are clear to auscultation -Chest x-ray showed mild congestive heart failure, BNP 116 -Received Lasix 40 mg p.o. twice daily yesterday, diuresed almost 6 L yesterday -Will give additional dose of Lasix 40 mg daily in the evening -Follow BMP in am  COPD -Stable -Continue as needed inhalers  Anxiety -Continue Atarax as needed  GERD -Continue pantoprazole    Medications     amLODipine  10 mg Oral Daily   vitamin C  1,000 mg Oral Daily   collagenase   Topical Daily   furosemide  40 mg Oral Daily   gabapentin  900 mg Oral TID   isosorbide mononitrate  15 mg Oral Daily   melatonin  3 mg Oral QHS   mometasone-formoterol  2 puff Inhalation BID   morphine  30 mg Oral Q12H   multivitamin with minerals  1 tablet Oral Daily   pantoprazole  40 mg Oral Daily   thiamine  100 mg Oral Daily     Data Reviewed:   CBG:  No results for input(s):  "GLUCAP" in the last 168 hours.  SpO2: 96 % O2 Flow Rate (L/min): 2 L/min    Vitals:   02/14/23 0835 02/14/23 2117 02/15/23 0555 02/15/23 0835  BP:  (!) 155/106 (!) 132/92   Pulse:  77 68   Resp:  16 16   Temp:  98 F (36.7 C) 98.4 F (36.9 C)   TempSrc:  Oral Oral   SpO2: 96% 95% 97% 96%  Weight:   84.2 kg   Height:          Data Reviewed:  Basic Metabolic Panel: Recent Labs  Lab 02/13/23 1201 02/15/23 0721  NA 137 137  K 3.5 4.0  CL 104 107  CO2 23 19*  GLUCOSE 160* 109*  BUN 14 19  CREATININE 0.89 1.07  CALCIUM 9.0 9.0    CBC: No results for input(s): "WBC", "NEUTROABS", "HGB", "HCT", "MCV", "PLT" in the last 168 hours.  LFT No results for input(s): "AST", "ALT", "ALKPHOS", "BILITOT", "PROT", "ALBUMIN" in the last 168 hours.   Antibiotics: Anti-infectives (From admission, onward)    Start     Dose/Rate Route Frequency Ordered Stop   12/26/22 2200  doxycycline (VIBRA-TABS) tablet 100 mg        100 mg Oral Every 12 hours 12/26/22 1307 01/08/23 2126   12/24/22 1000  doxycycline (VIBRA-TABS) tablet 100 mg  Status:  Discontinued        100 mg Oral Every 12 hours 12/24/22 0715 12/26/22 1307   12/10/22 1830  ceFAZolin (ANCEF) IVPB  2g/100 mL premix  Status:  Discontinued        2 g 200 mL/hr over 30 Minutes Intravenous Every 8 hours 12/10/22 1731 12/10/22 1740   12/10/22 1330  ceFAZolin (ANCEF) IVPB 2g/100 mL premix        2 g 200 mL/hr over 30 Minutes Intravenous On call to O.R. 12/10/22 0959 12/10/22 1534   12/08/22 0530  vancomycin (VANCOREADY) IVPB 1500 mg/300 mL        1,500 mg 150 mL/hr over 120 Minutes Intravenous Every 12 hours 12/07/22 1741 12/11/22 0955   12/08/22 0345  metroNIDAZOLE (FLAGYL) IVPB 500 mg        500 mg 100 mL/hr over 60 Minutes Intravenous Every 12 hours 12/07/22 1742 12/12/22 0344   12/07/22 2330  ceFEPIme (MAXIPIME) 2 g in sodium chloride 0.9 % 100 mL IVPB        2 g 200 mL/hr over 30 Minutes Intravenous Every 8 hours 12/07/22  1741 12/11/22 1529   12/07/22 2200  metroNIDAZOLE (FLAGYL) IVPB 500 mg  Status:  Discontinued        500 mg 100 mL/hr over 60 Minutes Intravenous Every 12 hours 12/07/22 2048 12/07/22 2056   12/07/22 1530  ceFEPIme (MAXIPIME) 2 g in sodium chloride 0.9 % 100 mL IVPB        2 g 200 mL/hr over 30 Minutes Intravenous  Once 12/07/22 1516 12/07/22 1734   12/07/22 1530  vancomycin (VANCOREADY) IVPB 1500 mg/300 mL        1,500 mg 150 mL/hr over 120 Minutes Intravenous  Once 12/07/22 1516 12/09/22 0333   12/07/22 1515  metroNIDAZOLE (FLAGYL) IVPB 500 mg        500 mg 100 mL/hr over 60 Minutes Intravenous  Once 12/07/22 1506 12/07/22 1734   12/07/22 1500  ceFAZolin (ANCEF) IVPB 1 g/50 mL premix  Status:  Discontinued        1 g 100 mL/hr over 30 Minutes Intravenous  Once 12/07/22 1452 12/07/22 1513        DVT prophylaxis: Lovenox  Code Status: Full code  Family Communication: No family at bedside   CONSULTS    Objective    Physical Examination:  General-appears in no acute distress Heart-S1-S2, regular, no murmur auscultated Lungs-clear to auscultation bilaterally, no wheezing or crackles auscultated Abdomen-soft, nontender, no organomegaly Extremities-trace edema bilaterally Neuro-alert, oriented x3, no focal deficit noted  Status is: Inpatient:          [name]   [facility] If 7PM-7AM, please contact night-coverage at [email or web address], Office  [phone]   02/15/2023, 9:58 AM  LOS: 70 days

## 2023-02-16 DIAGNOSIS — I96 Gangrene, not elsewhere classified: Secondary | ICD-10-CM | POA: Diagnosis not present

## 2023-02-16 DIAGNOSIS — F250 Schizoaffective disorder, bipolar type: Secondary | ICD-10-CM | POA: Diagnosis not present

## 2023-02-16 DIAGNOSIS — J449 Chronic obstructive pulmonary disease, unspecified: Secondary | ICD-10-CM | POA: Diagnosis not present

## 2023-02-16 LAB — BASIC METABOLIC PANEL
Anion gap: 12 (ref 5–15)
BUN: 17 mg/dL (ref 6–20)
CO2: 24 mmol/L (ref 22–32)
Calcium: 9.3 mg/dL (ref 8.9–10.3)
Chloride: 102 mmol/L (ref 98–111)
Creatinine, Ser: 0.94 mg/dL (ref 0.61–1.24)
GFR, Estimated: >60 mL/min (ref >60)
Glucose, Bld: 141 mg/dL — ABNORMAL HIGH (ref 70–99)
Potassium: 3.7 mmol/L (ref 3.5–5.1)
Sodium: 138 mmol/L (ref 135–145)

## 2023-02-16 MED ORDER — FUROSEMIDE 40 MG PO TABS
40.0000 mg | ORAL_TABLET | Freq: Once | ORAL | Status: AC
  Administered 2023-02-16: 40 mg via ORAL
  Filled 2023-02-16: qty 1

## 2023-02-16 NOTE — Progress Notes (Signed)
CSW attempted to reach [place] at [place] [phone] without success - no voicemail option available so a text was sent requesting a return call.  Madilyn Fireman, MSW, [name] Transitions of Care  Clinical Social Worker II [phone]

## 2023-02-16 NOTE — Plan of Care (Signed)
  Problem: Clinical Measurements: Goal: Ability to maintain clinical measurements within normal limits will improve Outcome: Completed/Met Goal: Respiratory complications will improve Outcome: Completed/Met Goal: Cardiovascular complication will be avoided Outcome: Completed/Met

## 2023-02-16 NOTE — Progress Notes (Signed)
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  Currently awaiting bed at skilled nursing facility.   Subjective   Diuresing well with Lasix.  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 every 12 hours -Awaiting skilled nursing facility placement  Hypertension -Continue amlodipine -Blood pressure is well-controlled  Acute on chronic  combined systolic and diastolic CHF -Not requiring oxygen, complains of dyspnea -Lungs are clear to auscultation -Chest x-ray showed mild congestive heart failure, BNP 116 -Received Lasix 40 mg p.o. twice daily yesterday, diuresed almost 6 L yesterday -Will again give additional dose of Lasix 40 mg daily in the evening -Follow BMP in am  COPD -Stable -Continue as needed inhalers  Anxiety -Continue Atarax as needed  GERD -Continue pantoprazole    Medications     amLODipine  10 mg Oral Daily   vitamin C  1,000 mg Oral Daily   collagenase   Topical Daily   furosemide  40 mg Oral Daily   gabapentin  900 mg Oral TID   isosorbide mononitrate  15 mg Oral Daily   melatonin  3 mg Oral QHS   mometasone-formoterol  2 puff Inhalation BID   morphine  30 mg Oral Q12H   multivitamin with minerals  1 tablet Oral Daily   pantoprazole  40 mg Oral Daily   thiamine  100 mg Oral Daily     Data Reviewed:   CBG:  No results for input(s): "GLUCAP" in the  last 168 hours.  SpO2: 94 % O2 Flow Rate (L/min): 2 L/min    Vitals:   02/15/23 0835 02/15/23 1925 02/15/23 2029 02/16/23 0859  BP:  136/89    Pulse: 86 70  70  Resp: 18 16  16   Temp:  98 F (36.7 C)    TempSrc:  Oral    SpO2: 96% 96% 95% 94%  Weight:      Height:          Data Reviewed:  Basic Metabolic Panel: Recent Labs  Lab 02/13/23 1201 02/15/23 0721  NA 137 137  K 3.5 4.0  CL 104 107  CO2 23 19*  GLUCOSE 160* 109*  BUN 14 19  CREATININE 0.89 1.07  CALCIUM 9.0 9.0    CBC: No results for input(s): "WBC", "NEUTROABS", "HGB", "HCT", "MCV", "PLT" in the last 168 hours.  LFT No results for input(s): "AST", "ALT", "ALKPHOS", "BILITOT", "PROT", "ALBUMIN" in the last 168 hours.   Antibiotics: Anti-infectives (From admission, onward)    Start     Dose/Rate Route Frequency Ordered Stop   12/26/22 2200  doxycycline (VIBRA-TABS) tablet 100 mg        100 mg Oral Every 12 hours 12/26/22 1307 01/08/23 2126   12/24/22 1000  doxycycline (VIBRA-TABS) tablet 100 mg  Status:  Discontinued        100 mg Oral Every 12 hours 12/24/22 0715 12/26/22 1307   12/10/22 1830  ceFAZolin (ANCEF) IVPB 2g/100 mL premix  Status:  Discontinued  2 g 200 mL/hr over 30 Minutes Intravenous Every 8 hours 12/10/22 1731 12/10/22 1740   12/10/22 1330  ceFAZolin (ANCEF) IVPB 2g/100 mL premix        2 g 200 mL/hr over 30 Minutes Intravenous On call to O.R. 12/10/22 0959 12/10/22 1534   12/08/22 0530  vancomycin (VANCOREADY) IVPB 1500 mg/300 mL        1,500 mg 150 mL/hr over 120 Minutes Intravenous Every 12 hours 12/07/22 1741 12/11/22 0955   12/08/22 0345  metroNIDAZOLE (FLAGYL) IVPB 500 mg        500 mg 100 mL/hr over 60 Minutes Intravenous Every 12 hours 12/07/22 1742 12/12/22 0344   12/07/22 2330  ceFEPIme (MAXIPIME) 2 g in sodium chloride 0.9 % 100 mL IVPB        2 g 200 mL/hr over 30 Minutes Intravenous Every 8 hours 12/07/22 1741 12/11/22 1529   12/07/22 2200  metroNIDAZOLE  (FLAGYL) IVPB 500 mg  Status:  Discontinued        500 mg 100 mL/hr over 60 Minutes Intravenous Every 12 hours 12/07/22 2048 12/07/22 2056   12/07/22 1530  ceFEPIme (MAXIPIME) 2 g in sodium chloride 0.9 % 100 mL IVPB        2 g 200 mL/hr over 30 Minutes Intravenous  Once 12/07/22 1516 12/07/22 1734   12/07/22 1530  vancomycin (VANCOREADY) IVPB 1500 mg/300 mL        1,500 mg 150 mL/hr over 120 Minutes Intravenous  Once 12/07/22 1516 12/09/22 0333   12/07/22 1515  metroNIDAZOLE (FLAGYL) IVPB 500 mg        500 mg 100 mL/hr over 60 Minutes Intravenous  Once 12/07/22 1506 12/07/22 1734   12/07/22 1500  ceFAZolin (ANCEF) IVPB 1 g/50 mL premix  Status:  Discontinued        1 g 100 mL/hr over 30 Minutes Intravenous  Once 12/07/22 1452 12/07/22 1513        DVT prophylaxis: Lovenox  Code Status: Full code  Family Communication: No family at bedside   CONSULTS    Objective    Physical Examination:  Appears in no acute distress S1-S2, regular, no murmur auscultated Lungs clear to auscultation bilaterally Swelling noted in bilateral upper and lower extremities  Status is: Inpatient:          [name]   [facility] If 7PM-7AM, please contact night-coverage at [email or web address], Office  [phone]   02/16/2023, 10:07 AM  LOS: 71 days

## 2023-02-17 DIAGNOSIS — J449 Chronic obstructive pulmonary disease, unspecified: Secondary | ICD-10-CM | POA: Diagnosis not present

## 2023-02-17 DIAGNOSIS — I96 Gangrene, not elsewhere classified: Secondary | ICD-10-CM | POA: Diagnosis not present

## 2023-02-17 DIAGNOSIS — F250 Schizoaffective disorder, bipolar type: Secondary | ICD-10-CM | POA: Diagnosis not present

## 2023-02-17 LAB — BASIC METABOLIC PANEL
Anion gap: 10 (ref 5–15)
BUN: 16 mg/dL (ref 6–20)
CO2: 22 mmol/L (ref 22–32)
Calcium: 9.2 mg/dL (ref 8.9–10.3)
Chloride: 103 mmol/L (ref 98–111)
Creatinine, Ser: 0.96 mg/dL (ref 0.61–1.24)
GFR, Estimated: >60 mL/min (ref >60)
Glucose, Bld: 99 mg/dL (ref 70–99)
Potassium: 3.6 mmol/L (ref 3.5–5.1)
Sodium: 135 mmol/L (ref 135–145)

## 2023-02-17 MED ORDER — FUROSEMIDE 40 MG PO TABS
40.0000 mg | ORAL_TABLET | Freq: Once | ORAL | Status: AC
  Administered 2023-02-17: 40 mg via ORAL
  Filled 2023-02-17: qty 1

## 2023-02-17 NOTE — Progress Notes (Signed)
CSW spoke with Maple Mirza of [name] to discuss patient. Clarene Critchley requesting updated clinicals be sent to her for review to support the housing process.  CSW sent clinicals via secure e-mail for review.  Madilyn Fireman, MSW, [name] Transitions of Care  Clinical Social Worker II [phone]

## 2023-02-17 NOTE — Progress Notes (Signed)
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  Currently awaiting bed at skilled nursing facility.   Subjective   Patient seen and examined, breathing has improved since doubling on the dose of Lasix.  Diuresing well with Lasix.   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 every 12 hours -Awaiting skilled nursing facility placement  Hypertension -Continue amlodipine -Blood pressure is well-controlled  Acute on chronic  combined systolic and diastolic CHF -Not requiring oxygen, complains of dyspnea -Lungs are clear to auscultation -Chest x-ray showed mild congestive heart failure, BNP 116 -Received Lasix 40 mg p.o. twice daily for past 3 days, diuresed almost 6 L yesterday -Will again give additional dose of Lasix 40 mg daily in the evening -Follow BMP in am  COPD -Stable -Continue as needed inhalers  Anxiety -Continue Atarax as needed  GERD -Continue pantoprazole    Medications     amLODipine  10 mg Oral Daily   vitamin C  1,000 mg Oral Daily   collagenase   Topical Daily   furosemide  40 mg Oral Daily   gabapentin  900 mg Oral TID   isosorbide mononitrate  15 mg Oral Daily   melatonin  3 mg Oral QHS   mometasone-formoterol  2 puff Inhalation BID   morphine  30 mg Oral Q12H   multivitamin with minerals  1 tablet Oral Daily   pantoprazole  40 mg Oral Daily   thiamine  100 mg  Oral Daily     Data Reviewed:   CBG:  No results for input(s): "GLUCAP" in the last 168 hours.  SpO2: 95 % O2 Flow Rate (L/min): 2 L/min    Vitals:   02/16/23 2059 02/17/23 0347 02/17/23 0405 02/17/23 0742  BP: 126/80 126/82  (!) 139/122  Pulse: 85 68  82  Resp: 17 17    Temp: 97.8 F (36.6 C) (!) 97.2 F (36.2 C)  98.3 F (36.8 C)  TempSrc: Oral Oral  Oral  SpO2: 97% 95%  95%  Weight:   86.2 kg   Height:          Data Reviewed:  Basic Metabolic Panel: Recent Labs  Lab 02/13/23 1201 02/15/23 0721 02/16/23 0853 02/17/23 0223  NA 137 137 138 135  K 3.5 4.0 3.7 3.6  CL 104 107 102 103  CO2 23 19* 24 22  GLUCOSE 160* 109* 141* 99  BUN 14 19 17 16   CREATININE 0.89 1.07 0.94 0.96  CALCIUM 9.0 9.0 9.3 9.2    CBC: No results for input(s): "WBC", "NEUTROABS", "HGB", "HCT", "MCV", "PLT" in the last 168 hours.  LFT No results for input(s): "AST", "ALT", "ALKPHOS", "BILITOT", "PROT", "ALBUMIN" in the last 168 hours.   Antibiotics: Anti-infectives (From admission, onward)    Start     Dose/Rate Route Frequency Ordered Stop   12/26/22 2200  doxycycline (VIBRA-TABS) tablet 100 mg        100 mg Oral Every 12 hours 12/26/22 1307 01/08/23 2126   12/24/22  1000  doxycycline (VIBRA-TABS) tablet 100 mg  Status:  Discontinued        100 mg Oral Every 12 hours 12/24/22 0715 12/26/22 1307   12/10/22 1830  ceFAZolin (ANCEF) IVPB 2g/100 mL premix  Status:  Discontinued        2 g 200 mL/hr over 30 Minutes Intravenous Every 8 hours 12/10/22 1731 12/10/22 1740   12/10/22 1330  ceFAZolin (ANCEF) IVPB 2g/100 mL premix        2 g 200 mL/hr over 30 Minutes Intravenous On call to O.R. 12/10/22 0959 12/10/22 1534   12/08/22 0530  vancomycin (VANCOREADY) IVPB 1500 mg/300 mL        1,500 mg 150 mL/hr over 120 Minutes Intravenous Every 12 hours 12/07/22 1741 12/11/22 0955   12/08/22 0345  metroNIDAZOLE (FLAGYL) IVPB 500 mg        500 mg 100 mL/hr over 60 Minutes Intravenous Every  12 hours 12/07/22 1742 12/12/22 0344   12/07/22 2330  ceFEPIme (MAXIPIME) 2 g in sodium chloride 0.9 % 100 mL IVPB        2 g 200 mL/hr over 30 Minutes Intravenous Every 8 hours 12/07/22 1741 12/11/22 1529   12/07/22 2200  metroNIDAZOLE (FLAGYL) IVPB 500 mg  Status:  Discontinued        500 mg 100 mL/hr over 60 Minutes Intravenous Every 12 hours 12/07/22 2048 12/07/22 2056   12/07/22 1530  ceFEPIme (MAXIPIME) 2 g in sodium chloride 0.9 % 100 mL IVPB        2 g 200 mL/hr over 30 Minutes Intravenous  Once 12/07/22 1516 12/07/22 1734   12/07/22 1530  vancomycin (VANCOREADY) IVPB 1500 mg/300 mL        1,500 mg 150 mL/hr over 120 Minutes Intravenous  Once 12/07/22 1516 12/09/22 0333   12/07/22 1515  metroNIDAZOLE (FLAGYL) IVPB 500 mg        500 mg 100 mL/hr over 60 Minutes Intravenous  Once 12/07/22 1506 12/07/22 1734   12/07/22 1500  ceFAZolin (ANCEF) IVPB 1 g/50 mL premix  Status:  Discontinued        1 g 100 mL/hr over 30 Minutes Intravenous  Once 12/07/22 1452 12/07/22 1513        DVT prophylaxis: Lovenox  Code Status: Full code  Family Communication: No family at bedside   CONSULTS    Objective    Physical Examination:  General-appears in no acute distress Heart-S1-S2, regular, no murmur auscultated Lungs-clear to auscultation bilaterally, no wheezing or crackles auscultated Abdomen-soft, nontender, no organomegaly Extremities-bilateral BKA Neuro-alert, oriented x3, no focal deficit noted  Status is: Inpatient:          [place]   [facility] If 7PM-7AM, please contact night-coverage at [email or web address], Office  [phone]   02/17/2023, 9:43 AM  LOS: 72 days

## 2023-02-17 NOTE — Progress Notes (Signed)
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 PMH includes asthma, bioplar 1, depression, schizoaffective disorder, CVA, chronic CHF, homelessness, HTN, DM2, cocaine and cannabis use, and R AKA.    PT Comments    Pt received in [facility], agreeable to session with students after prior discussion to "set an appointment" for PT; pt preferred to start in gym instead of his room; session focused on functional mobility, transfers, and exercises; after discussing potential exercise modifications, pt attempted lateral transfer from [facility] to mat table, but WC tipped back a little and pt opted not to transfer and return to room; wanting to educate students and emphasize his willingness to work, pt demonstrated AP transfer to/from toilet independently; pt did independent AP transfer over the wheel to the bed with WC parallel to bed; pt did AP transfer with supervision from the bed to the [facility]; while in room, pt demonstrated ability to pick things up from the ground independently; pt's independent transfers showed progress toward goals; fear of falling and anxiety continue to limit further progress; Recommend we consider requesting inpatient psychiatric services; in future sessions, PT can address more efficient transfer strategies and exercises that would accommodate to pt's positional preferences (i.e. side-lying)   Recommendations for follow up therapy are one component of a multi-disciplinary discharge planning process, led by the attending physician.  Recommendations may be updated based on patient status, additional functional criteria and insurance authorization.  Follow Up Recommendations  Other (comment) (TOC working on multiple options for housing) Can patient physically be transported by private vehicle: No (if car  transfer is meaningful to pt, can work with sliding board on that)   Assistance Recommended at Discharge Set up Supervision/Assistance  Patient can return home with the following A little help with walking and/or transfers;A little help with bathing/dressing/bathroom;Assistance with cooking/housework;Assist for transportation;Help with stairs or ramp for entrance   Equipment Recommendations  Other (comment) (New wheelchair)    Recommendations for Other Services Other (comment) (Prosthetist consult for preprosthetic ed.; is there anyway he could get consistent inpatient psychiatric counseling?)     Precautions / Restrictions Precautions Precautions: Fall Precaution Comments: R AKA, L BKA Restrictions Weight Bearing Restrictions: No     Mobility  Bed Mobility Overal bed mobility: Modified Independent             General bed mobility comments: From sitting, able to move WC from side of the distal 1/3 of the bed around the end of bed close to other side; Scooting well across the bed    Transfers Overall transfer level: Needs assistance   Transfers: Bed to chair/wheelchair/BSC         Anterior-Posterior transfers: Supervision  Lateral/Scoot Transfers: Supervision General transfer comment: pt attempted lateral transfer from [facility] to mat table, but WC tipped back a little and pt opted not to transfer and return to room; in room, pt demonstrated AP transfer w/c <> toilet, pt did independent AP transfer over the wheel to the bed with WC parallel to bed; did AP transfer with supervision from the bed to the [facility]    Ambulation/Gait                   Secretary/administrator Wheelchair mobility: Yes Wheelchair  propulsion: Both upper extremities Wheelchair parts: Independent Distance: managing around objects in and around room into gym Wheelchair Assistance Details (indicate cue type and reason): Spends lots of time in the wc  throughout the day  Modified Rankin (Stroke Patients Only)       Balance     Sitting balance-Leahy Scale: Good Sitting balance - Comments: seated in w/c                                    Cognition Arousal/Alertness: Awake/alert Behavior During Therapy: WFL for tasks assessed/performed Overall Cognitive Status: Difficult to assess Area of Impairment: Problem solving                               General Comments: Difficult to assess due to pt seeming resistant to clarifying questions regarding modifications for exercise        Exercises      General Comments General comments (skin integrity, edema, etc.): Seemed to want to show PT students his lower pulmonary function and still ability and independence; demonstrated use of nasal canala for rescue O2 and rescue inhaler; noticed mild shortness of breath after transfer from bed      Pertinent Vitals/Pain Pain Assessment Pain Assessment: No/denies pain Faces Pain Scale: No hurt Pain Intervention(s): Monitored during session    Home Living                          Prior Function            PT Goals (current goals can now be found in the care plan section) Acute Rehab PT Goals Patient Stated Goal: Prepare for prosthetics fitting PT Goal Formulation: With patient Time For Goal Achievement: 02/16/23 Potential to Achieve Goals: Good Progress towards PT goals: Progressing toward goals (slowly)    Frequency    Min 1X/week      PT Plan Other (comment)    Co-evaluation              AM-PAC PT "6 Clicks" Mobility   Outcome Measure  Help needed turning from your back to your side while in a flat bed without using bedrails?: None Help needed moving from lying on your back to sitting on the side of a flat bed without using bedrails?: None Help needed moving to and from a bed to a chair (including a wheelchair)?: A Little Help needed standing up from a chair using your  arms (e.g., wheelchair or bedside chair)?: Total Help needed to walk in hospital room?: Total Help needed climbing 3-5 steps with a railing? : Total 6 Click Score: 14    End of Session   Activity Tolerance: Patient tolerated treatment well Patient left: Other (comment) (In wheelchair cruising halls)   PT Visit Diagnosis: Muscle weakness (generalized) (M62.81);Other abnormalities of gait and mobility (R26.89)     Time: 1400-1445 PT Time Calculation (min) (ACUTE ONLY): 45 min  Charges:  $Therapeutic Activity: 8-22 mins $Self Care/Home Management: 8-22 $Wheel Chair Management: 8-22 mins                     Roney Marion, PT  Acute Rehabilitation Services Office [phone]    Colletta [place] 02/17/2023, 5:12 PM

## 2023-02-17 NOTE — Plan of Care (Signed)
  Problem: Fluid Volume: Goal: Hemodynamic stability will improve Outcome: Progressing   Problem: Activity: Goal: Risk for activity intolerance will decrease Outcome: Progressing   Problem: Nutrition: Goal: Adequate nutrition will be maintained Outcome: Progressing

## 2023-02-17 NOTE — Plan of Care (Signed)

## 2023-02-18 DIAGNOSIS — I96 Gangrene, not elsewhere classified: Secondary | ICD-10-CM | POA: Diagnosis not present

## 2023-02-18 LAB — BASIC METABOLIC PANEL
Anion gap: 13 (ref 5–15)
BUN: 14 mg/dL (ref 6–20)
CO2: 25 mmol/L (ref 22–32)
Calcium: 9.5 mg/dL (ref 8.9–10.3)
Chloride: 101 mmol/L (ref 98–111)
Creatinine, Ser: 1.04 mg/dL (ref 0.61–1.24)
GFR, Estimated: >60 mL/min (ref >60)
Glucose, Bld: 100 mg/dL — ABNORMAL HIGH (ref 70–99)
Potassium: 3.8 mmol/L (ref 3.5–5.1)
Sodium: 139 mmol/L (ref 135–145)

## 2023-02-18 NOTE — Progress Notes (Signed)
[facility] PROGRESS NOTE    Progress Note  [name]  [id number] DOB: 08-14-1966 DOA: 12/07/2022 PCP: Marliss Coots, NP     Brief Narrative:   [name] is an 57 y.o. 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 Has completed 2 weeks of Doxy.  Physical therapy evaluated the patient. Continue Neurontin, oxycodone and long-acting narcotics for pain. Last hemoglobin A1c of 6.1 on 12/07/2020 . Awaiting skilled nursing facility placement.  Essential hypertension: Relatively stable, continue on the amlodipine and Lasix.  Chronic combined systolic and diastolic heart failure: Appears to be compensated continue Lasix, has been refusing labs intermittently. Potassium seems to be stable. Currently on oral Lasix.  COPD: Stable continue inhalers.  Anxiety: Continue Atarax.  GERD: Continue PPI.  Constipation: Continue bowel regimen.  Schizoaffective disorder, bipolar type ([place]) Noted   DVT prophylaxis: lovenox Family Communication:none Status is: Inpatient Remains inpatient appropriate because: Awaiting placement.    Code Status:     Code Status Orders  (From admission, onward)           Start     Ordered   12/07/22 1711  Full code  Continuous       Question Answer Comment  By: Other   Comments: other      12/07/22 1712           Code Status History     Date Active Date Inactive Code Status Order ID Comments User Context   11/24/2022 0745 12/03/2022 1902 Full Code  [id number]  Reubin Milan, MD ED   11/03/2022 0253 11/03/2022 1937 Full Code [id number]  Larene Pickett, PA-C ED   08/17/2022 0142 10/09/2022 2035 Full Code [id number]  Clance Boll, MD ED   07/12/2022 1026 07/25/2022 2113 Full Code [id number]  Reubin Milan, MD ED   04/04/2022 1240 04/11/2022 1710 Full Code [id number]  Reubin Milan, MD ED   04/03/2022 1411 04/04/2022 1240 Full Code [id number]  Dorothyann Peng, PA-C ED   03/23/2022 2112 03/31/2022 1517 Full Code [id number]  Elodia Florence., MD Inpatient   02/26/2022 1812 03/07/2022 1908 Full Code [id number]  Jonnie Finner, DO Inpatient   01/29/2022 1515 02/05/2022 1533 Full Code [id number]  Orma Flaming, MD ED   12/03/2021 0841 12/09/2021 1457 Full Code [id number]  Vernelle Emerald, MD ED   11/24/2021 2315 11/25/2021 2004 Full Code [id number]  Muthersbaugh, Gwenlyn Perking ED   11/13/2021 1653 11/21/2021 1748 Full Code [id number]  Reubin Milan, MD Inpatient   11/02/2021 2000 11/04/2021 2331 Full Code [id number]  Varney Biles, MD ED   07/03/2021 1143 07/05/2021 0057 Full Code [id number]  Regan Lemming, MD ED   03/02/2021 1936 03/12/2021 1811 Full Code [id number]  Lenore Cordia, MD ED   02/10/2021 2345 02/13/2021 1958 Full Code [id number]  Larene Pickett, PA-C ED   02/09/2021 1953 02/10/2021 1536 Full Code [id number]  Arnaldo Natal, MD ED   12/17/2020 1516 02/03/2021 1650 Full Code [id number]  Gardiner Barefoot, NP ED   12/08/2020 1336 12/10/2020 1931 Full Code [id number]  Alcus Dad, MD ED  11/13/2020 2028 11/16/2020 1520 Full Code [id number]  Jonnie Finner, DO ED   10/26/2020 1039 10/31/2020 2157 Full Code [id number]  Harold Hedge, MD ED   10/15/2020 [phone] 10/18/2020 1810 Full Code [id number]  Chotiner, Yevonne Aline, MD Inpatient   10/02/2020 2247 10/07/2020 1809 Full Code [id number]  Vianne Bulls, MD ED   09/25/2020 1559 09/26/2020 2002 Full Code [id number]  Para Skeans, MD ED   09/09/2020 1251 09/11/2020 1546 Full Code [id number]  Dessa Phi,  DO Inpatient   09/07/2020 1753 09/09/2020 1251 DNR [id number]  Jonnie Finner, DO Inpatient   07/21/2020 1700 07/23/2020 2001 Full Code [id number]  Mosetta Anis, MD ED   07/07/2020 1529 07/07/2020 2109 Full Code [id number]  Wyvonnia Dusky, MD ED   06/24/2019 0131 06/24/2019 1630 Full Code [id number]  Rozetta Nunnery, NP Inpatient   06/24/2019 0130 06/24/2019 0131 Full Code [id number]  Mordecai Maes, NP Inpatient   06/21/2019 0941 06/22/2019 2007 Full Code [id number]  Rory Percy, DO Inpatient   05/24/2019 1117 05/26/2019 1836 Full Code [id number]  Everrett Coombe, MD ED   04/18/2019 0149 04/19/2019 1628 DNR [id number]  Ina Homes, MD ED   04/04/2019 0555 04/06/2019 1413 Full Code [id number]  Lars Mage, MD ED   03/27/2019 2130 03/29/2019 1900 Full Code [id number]  Mosetta Anis, MD ED   03/04/2019 1600 03/09/2019 1222 Full Code [id number]  Ledell Noss, MD ED   02/13/2018 0439 02/14/2018 1333 Full Code [id number]  Ezequiel Essex, MD ED   12/26/2016 0547 12/29/2016 2021 Full Code [id number]  Clapacs, Madie Reno, MD Inpatient   11/27/2016 1541 11/28/2016 1319 Full Code [id number]  Dalia Heading, PA-C ED   10/29/2016 1415 10/31/2016 1150 Full Code [id number]  Davonna Belling, MD ED   09/06/2016 2230 09/07/2016 1350 Full Code [id number]  Orlie Dakin, MD ED   09/06/2016 0934 09/06/2016 1529 Full Code [id number]  Macarthur Critchley, MD ED   09/02/2016 0045 09/04/2016 2127 Full Code [id number]  Junius Creamer, NP ED   07/30/2016 1357 08/06/2016 2031 Full Code [id number]  Nanci Pina, [place] Inpatient   07/30/2016 0143 07/30/2016 1153 Full Code [id number]  Orpah Greek, MD ED   05/30/2016 1622 06/05/2016 1454 Full Code [id number]  Niel Hummer, NP Inpatient   05/29/2016 1635 05/30/2016 1622 Full Code [id number]  Deno Etienne, DO ED   12/02/2011 0245 12/12/2011 0720 Full Code [id number]  Carlisle Cater, [place] ED         IV Access:   Peripheral IV   Procedures and diagnostic studies:   No results found.   Medical Consultants:    None.   Subjective:    [name] no complaints.  Objective:    Vitals:   02/17/23 1924 02/17/23 2037 02/18/23 0346 02/18/23 0739  BP: 131/77  (!) 142/86 118/83  Pulse: 81  63 71  Resp: 18  18 18   Temp: 98.1 F (36.7 C)     TempSrc: Oral     SpO2: 98% 100% 96% 95%  Weight:      Height:       SpO2: 95 % O2 Flow Rate (L/min): 2 L/min  No intake or output data in the 24 hours ending 02/18/23 0807  Filed Weights   02/11/23 0429 02/15/23 0555 02/17/23 0405  Weight: 89.8 kg 84.2 kg 86.2 kg    Exam: General exam: In no acute distress. Respiratory system: Good air movement and clear to auscultation. Cardiovascular system: S1 & S2 heard, RRR. No JVD.  Gastrointestinal system: Abdomen is nondistended, soft and nontender.  Skin: No rashes, lesions or ulcers Psychiatry: Judgement and insight appear normal. Mood & affect appropriate.    Data Reviewed:    Labs: Basic Metabolic Panel: Recent Labs  Lab 02/13/23 1201 02/15/23 0721 02/16/23 0853 02/17/23 0223 02/18/23 0304  NA 137 137 138 135 139  K 3.5 4.0 3.7 3.6 3.8  CL 104 107 102 103 101  CO2 23 19* 24 22 25   GLUCOSE 160* 109* 141* 99 100*  BUN 14 19 17 16 14   CREATININE 0.89 1.07 0.94 0.96 1.04  CALCIUM 9.0 9.0 9.3 9.2 9.5    GFR Estimated Creatinine Clearance: 88.3 mL/min (by C-G formula based on SCr of 1.04 mg/dL). Liver Function Tests: No results for input(s): "AST", "ALT", "ALKPHOS", "BILITOT", "PROT", "ALBUMIN" in the last 168 hours. No results for input(s): "LIPASE", "AMYLASE" in the last 168 hours. No results for input(s): "AMMONIA" in the last 168 hours. Coagulation profile No results for input(s): "INR", "PROTIME" in the last 168 hours. COVID-19 Labs  No results for input(s): "DDIMER", "FERRITIN", "LDH", "CRP" in the last 72 hours.  Lab Results  Component Value Date   SARSCOV2NAA NEGATIVE 11/23/2022   SARSCOV2NAA NEGATIVE 11/03/2022   [place] NEGATIVE 07/12/2022   [place]  NEGATIVE 04/03/2022    CBC: No results for input(s): "WBC", "NEUTROABS", "HGB", "HCT", "MCV", "PLT" in the last 168 hours. Cardiac Enzymes: No results for input(s): "CKTOTAL", "CKMB", "CKMBINDEX", "TROPONINI" in the last 168 hours. BNP (last 3 results) No results for input(s): "PROBNP" in the last 8760 hours. CBG: No results for input(s): "GLUCAP" in the last 168 hours. D-Dimer: No results for input(s): "DDIMER" in the last 72 hours. Hgb A1c: No results for input(s): "HGBA1C" in the last 72 hours. Lipid Profile: No results for input(s): "CHOL", "HDL", "LDLCALC", "TRIG", "CHOLHDL", "LDLDIRECT" in the last 72 hours. Thyroid function studies: No results for input(s): "TSH", "T4TOTAL", "T3FREE", "THYROIDAB" in the last 72 hours.  Invalid input(s): "FREET3" Anemia work up: No results for input(s): "VITAMINB12", "FOLATE", "FERRITIN", "TIBC", "IRON", "RETICCTPCT" in the last 72 hours. Sepsis Labs: No results for input(s): "PROCALCITON", "WBC", "LATICACIDVEN" in the last 168 hours. Microbiology No results found for this or any previous visit (from the past 240 hour(s)).   Medications:    amLODipine  10 mg Oral Daily   vitamin C  1,000 mg Oral Daily   collagenase   Topical Daily   furosemide  40 mg Oral Daily   gabapentin  900 mg Oral TID   isosorbide mononitrate  15 mg Oral Daily   melatonin  3 mg Oral QHS   mometasone-formoterol  2 puff Inhalation BID   morphine  30 mg Oral Q12H   multivitamin with minerals  1 tablet Oral Daily   pantoprazole  40 mg Oral Daily   thiamine  100 mg Oral Daily   Continuous Infusions:  magnesium sulfate bolus IVPB        LOS: 73 days   Charlynne Cousins  [facility]  02/18/2023, 8:07 AM

## 2023-02-19 DIAGNOSIS — I96 Gangrene, not elsewhere classified: Secondary | ICD-10-CM | POA: Diagnosis not present

## 2023-02-19 NOTE — Progress Notes (Signed)
Pt. Agitated refused to take morning medications

## 2023-02-19 NOTE — Progress Notes (Addendum)
CSW spoke with Lilyan Punt from [place] with the Transition [employer] program who states she is coming to visit patient today to complete additional forms.  CSW informed RN of information.  Madilyn Fireman, MSW, [name] Transitions of Care  Clinical Social Worker II [phone]

## 2023-02-19 NOTE — Progress Notes (Signed)
[facility] PROGRESS NOTE    Progress Note  [name]  [id number] DOB: 1966/06/07 DOA: 12/07/2022 PCP: [name] Coots, NP     Brief Narrative:   [name] is an 57 y.o. 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 Has completed 2 weeks of Doxy.  Physical therapy evaluated the patient. Continue Neurontin, oxycodone and long-acting narcotics for pain. Last hemoglobin A1c of 6.1 on 12/07/2020 . Awaiting skilled nursing facility placement.  Essential hypertension: Relatively stable, continue on the amlodipine and Lasix.  Chronic combined systolic and diastolic heart failure: Appears to be compensated continue Lasix, has been refusing labs intermittently. Potassium seems to be stable. Currently on oral Lasix.  COPD: Stable continue inhalers.  Anxiety: Continue Atarax.  GERD: Continue PPI.  Constipation: Continue bowel regimen.  Schizoaffective disorder, bipolar type ([place]) Noted   DVT prophylaxis: lovenox Family Communication:none Status is: Inpatient Remains inpatient appropriate because: Awaiting placement.    Code Status:     Code Status Orders  (From admission, onward)           Start     Ordered   12/07/22 1711  Full code  Continuous       Question Answer Comment  By: Other   Comments: other      12/07/22 1712           Code Status History     Date Active Date Inactive Code Status Order ID Comments User Context   11/24/2022 0745 12/03/2022 1902 Full Code  [id number]  Reubin Milan, MD ED   11/03/2022 0253 11/03/2022 1937 Full Code [id number]  Larene Pickett, PA-C ED   08/17/2022 0142 10/09/2022 2035 Full Code [id number]  Clance Boll, MD ED   07/12/2022 1026 07/25/2022 2113 Full Code [id number]  Reubin Milan, MD ED   04/04/2022 1240 04/11/2022 1710 Full Code [id number]  Reubin Milan, MD ED   04/03/2022 1411 04/04/2022 1240 Full Code [id number]  Dorothyann Peng, PA-C ED   03/23/2022 2112 03/31/2022 1517 Full Code [id number]  Elodia Florence., MD Inpatient   02/26/2022 1812 03/07/2022 1908 Full Code [id number]  Jonnie Finner, DO Inpatient   01/29/2022 1515 02/05/2022 1533 Full Code [id number]  Orma Flaming, MD ED   12/03/2021 0841 12/09/2021 1457 Full Code [id number]  Vernelle Emerald, MD ED   11/24/2021 2315 11/25/2021 2004 Full Code [id number]  Muthersbaugh, Gwenlyn Perking ED   11/13/2021 1653 11/21/2021 1748 Full Code [id number]  Reubin Milan, MD Inpatient   11/02/2021 2000 11/04/2021 2331 Full Code [id number]  Varney Biles, MD ED   07/03/2021 1143 07/05/2021 0057 Full Code [id number]  Regan Lemming, MD ED   03/02/2021 1936 03/12/2021 1811 Full Code [id number]  Lenore Cordia, MD ED   02/10/2021 2345 02/13/2021 1958 Full Code [id number]  Larene Pickett, PA-C ED   02/09/2021 1953 02/10/2021 1536 Full Code [id number]  Arnaldo Natal, MD ED   12/17/2020 1516 02/03/2021 1650 Full Code [id number]  Gardiner Barefoot, NP ED   12/08/2020 1336 12/10/2020 1931 Full Code [id number]  Alcus Dad, MD ED  11/13/2020 2028 11/16/2020 1520 Full Code [id number]  Jonnie Finner, DO ED   10/26/2020 1039 10/31/2020 2157 Full Code [id number]  Harold Hedge, MD ED   10/15/2020 [phone] 10/18/2020 1810 Full Code [id number]  Chotiner, Yevonne Aline, MD Inpatient   10/02/2020 2247 10/07/2020 1809 Full Code [id number]  Vianne Bulls, MD ED   09/25/2020 1559 09/26/2020 2002 Full Code [id number]  Para Skeans, MD ED   09/09/2020 1251 09/11/2020 1546 Full Code [id number]  Dessa Phi,  DO Inpatient   09/07/2020 1753 09/09/2020 1251 DNR [id number]  Jonnie Finner, DO Inpatient   07/21/2020 1700 07/23/2020 2001 Full Code [id number]  Mosetta Anis, MD ED   07/07/2020 1529 07/07/2020 2109 Full Code [id number]  Wyvonnia Dusky, MD ED   06/24/2019 0131 06/24/2019 1630 Full Code [id number]  Rozetta Nunnery, NP Inpatient   06/24/2019 0130 06/24/2019 0131 Full Code [id number]  Mordecai Maes, NP Inpatient   06/21/2019 0941 06/22/2019 2007 Full Code [id number]  Rory Percy, DO Inpatient   05/24/2019 1117 05/26/2019 1836 Full Code [id number]  Everrett Coombe, MD ED   04/18/2019 0149 04/19/2019 1628 DNR [id number]  Ina Homes, MD ED   04/04/2019 0555 04/06/2019 1413 Full Code [id number]  Lars Mage, MD ED   03/27/2019 2130 03/29/2019 1900 Full Code [id number]  Mosetta Anis, MD ED   03/04/2019 1600 03/09/2019 1222 Full Code [id number]  Ledell Noss, MD ED   02/13/2018 0439 02/14/2018 1333 Full Code [id number]  Ezequiel Essex, MD ED   12/26/2016 0547 12/29/2016 2021 Full Code [id number]  Clapacs, Madie Reno, MD Inpatient   11/27/2016 1541 11/28/2016 1319 Full Code [id number]  Dalia Heading, PA-C ED   10/29/2016 1415 10/31/2016 1150 Full Code [id number]  Davonna Belling, MD ED   09/06/2016 2230 09/07/2016 1350 Full Code [id number]  Orlie Dakin, MD ED   09/06/2016 0934 09/06/2016 1529 Full Code [id number]  Macarthur Critchley, MD ED   09/02/2016 0045 09/04/2016 2127 Full Code [id number]  Junius Creamer, NP ED   07/30/2016 1357 08/06/2016 2031 Full Code [id number]  Nanci Pina, [place] Inpatient   07/30/2016 0143 07/30/2016 1153 Full Code [id number]  Orpah Greek, MD ED   05/30/2016 1622 06/05/2016 1454 Full Code [id number]  Niel Hummer, NP Inpatient   05/29/2016 1635 05/30/2016 1622 Full Code [id number]  Deno Etienne, DO ED   12/02/2011 0245 12/12/2011 0720 Full Code [id number]  Carlisle Cater, [place] ED         IV Access:   Peripheral IV   Procedures and diagnostic studies:   No results found.   Medical Consultants:    None.   Subjective:    [name] no complaints.  Objective:    Vitals:   02/18/23 0346 02/18/23 0739 02/18/23 1941 02/19/23 0253  BP: (!) 142/86 118/83 131/85 126/70  Pulse: 63 71 78 76  Resp: 18 18 17 17   Temp:   97.6 F (36.4 C)   TempSrc:   Oral   SpO2: 96% 95% 95% 99%  Weight:      Height:       SpO2: 99 % O2 Flow Rate (L/min): 2 L/min   Intake/Output Summary (Last 24 hours) at 02/19/2023 0825 Last data filed at 02/19/2023 0255 Gross per 24 hour  Intake 1080 ml  Output 1500 ml  Net -420 ml    Filed Weights   02/11/23 0429 02/15/23 0555 02/17/23 0405  Weight: 89.8 kg 84.2 kg 86.2 kg    Exam: General exam: In  no acute distress. Respiratory system: Good air movement and clear to auscultation. Cardiovascular system: S1 & S2 heard, RRR. No JVD. Gastrointestinal system: Abdomen is nondistended, soft and nontender.  Skin: No rashes, lesions or ulcers Psychiatry: Judgement and insight appear normal. Mood & affect appropriate.    Data Reviewed:    Labs: Basic Metabolic Panel: Recent Labs  Lab 02/13/23 1201 02/15/23 0721 02/16/23 0853 02/17/23 0223 02/18/23 0304  NA 137 137 138 135 139  K 3.5 4.0 3.7 3.6 3.8  CL 104 107 102 103 101  CO2 23 19* 24 22 25   GLUCOSE 160* 109* 141* 99 100*  BUN 14 19 17 16 14   CREATININE 0.89 1.07 0.94 0.96 1.04  CALCIUM 9.0 9.0 9.3 9.2 9.5    GFR Estimated Creatinine Clearance: 88.3 mL/min (by C-G formula based on SCr of 1.04 mg/dL). Liver Function Tests: No results for input(s): "AST", "ALT", "ALKPHOS", "BILITOT", "PROT", "ALBUMIN" in the last 168 hours. No results for input(s): "LIPASE", "AMYLASE" in the last 168 hours. No results for input(s): "AMMONIA" in the last 168 hours. Coagulation profile No results for input(s): "INR", "PROTIME" in the last 168 hours. COVID-19 Labs  No results for input(s): "DDIMER", "FERRITIN", "LDH", "CRP" in the last 72 hours.  Lab Results  Component Value Date    SARSCOV2NAA NEGATIVE 11/23/2022   SARSCOV2NAA NEGATIVE 11/03/2022   [place] NEGATIVE 07/12/2022   [place] NEGATIVE 04/03/2022    CBC: No results for input(s): "WBC", "NEUTROABS", "HGB", "HCT", "MCV", "PLT" in the last 168 hours. Cardiac Enzymes: No results for input(s): "CKTOTAL", "CKMB", "CKMBINDEX", "TROPONINI" in the last 168 hours. BNP (last 3 results) No results for input(s): "PROBNP" in the last 8760 hours. CBG: No results for input(s): "GLUCAP" in the last 168 hours. D-Dimer: No results for input(s): "DDIMER" in the last 72 hours. Hgb A1c: No results for input(s): "HGBA1C" in the last 72 hours. Lipid Profile: No results for input(s): "CHOL", "HDL", "LDLCALC", "TRIG", "CHOLHDL", "LDLDIRECT" in the last 72 hours. Thyroid function studies: No results for input(s): "TSH", "T4TOTAL", "T3FREE", "THYROIDAB" in the last 72 hours.  Invalid input(s): "FREET3" Anemia work up: No results for input(s): "VITAMINB12", "FOLATE", "FERRITIN", "TIBC", "IRON", "RETICCTPCT" in the last 72 hours. Sepsis Labs: No results for input(s): "PROCALCITON", "WBC", "LATICACIDVEN" in the last 168 hours. Microbiology No results found for this or any previous visit (from the past 240 hour(s)).   Medications:    amLODipine  10 mg Oral Daily   vitamin C  1,000 mg Oral Daily   collagenase   Topical Daily   furosemide  40 mg Oral Daily   gabapentin  900 mg Oral TID   isosorbide mononitrate  15 mg Oral Daily   melatonin  3 mg Oral QHS   mometasone-formoterol  2 puff Inhalation BID   morphine  30 mg Oral Q12H   multivitamin with minerals  1 tablet Oral Daily   pantoprazole  40 mg Oral Daily   thiamine  100 mg Oral Daily   Continuous Infusions:  magnesium sulfate bolus IVPB        LOS: 74 days   Charlynne Cousins  [facility]  02/19/2023, 8:25 AM

## 2023-02-19 NOTE — Progress Notes (Signed)
Occupational Therapy Treatment Patient Details Name: [name] MRN: [id number] DOB: 1966-01-27 Today's Date: 02/19/2023   History of present illness Pt is a 57 y/o M who presented 12/17/22 with L ankle cellulitis and underwent L BKA on 1/10. PMH includes asthma, bioplar 1, depression, schizoaffective disorder, CVA, chronic CHF, homelessness, HTN, DM2, cocaine and cannabis use, and R AKA.   OT comments  Pt seen for OT treatment with completion of anterior/posterior toilet transfer at modified independent level from his wheelchair.  Pt voice concerns of how he would transfer to a toilet that had a tank or was setup differently from the one in his room.  Therapist provided other options such as use of a drop arm commode to increase even height of toilet or completion of scoot pivot transfer from the side with or without the drop arm commode.  Pt declines attempting scoot transfer from the side stating he hits the wheel of his wheelchair.  Therapist suggested working on scooting forward and turning in the chair to find the easiest spot where he can avoid the wheel but still scoot sideways on the toilet.  Pt declines to participate in problem solving and just repeats the same information over.  He is not able to rationalize trying a different technique and continues to ask what else could be done.  Therapist explains there are only two options: 1. Anterior posterior transfer or 2. Scooting transfer possibly with or without a board ( sliding boards can be tricky on solid smooth surfaces and present a fall risk so being able to scoot without is ideal).  He declines transfer to the bed in order to experiment with angle that would work on the toilet.  Feel like pt is at modified independent level for current toilet transfers anterior/posterior and other ADLs at this time.  He continues to demonstrate limited insight into trying other methods and is self limiting when it comes to this.  Feel he no longer warrants  OT services at this time and sign off.  Recommend initial PRN supervision at discharge environment for safety in case setup is different.     Recommendations for follow up therapy are one component of a multi-disciplinary discharge planning process, led by the attending physician.  Recommendations may be updated based on patient status, additional functional criteria and insurance authorization.    Follow Up Recommendations  Skilled nursing-short term rehab (<3 hours/day) (Pt needs 24 hr supervision for safety)     Assistance Recommended at Discharge PRN     Equipment Recommendations  Other (comment) (drop arm commode)    Recommendations for Other Services      Precautions / Restrictions Precautions Precautions: Fall Precaution Comments: R AKA, L BKA Restrictions Weight Bearing Restrictions: Yes RLE Weight Bearing: Non weight bearing LLE Weight Bearing: Non weight bearing       Mobility Bed Mobility                    Transfers                         Balance                                           ADL either performed or assessed with clinical judgement   ADL Overall ADL's : Needs assistance/impaired  Toilet Transfer: Modified Independent;Regular Toilet;Grab bars [employer] Details (indicate cue type and reason): AP transfere to elevated toilet         Functional mobility during ADLs: Modified independent;Wheelchair (modified independent for anterior/posterior transfer) General ADL Comments: Pt able to return demonstrate safe anterior/posterior transfer onto toilet with rails.  He reports being concerned with how to transfer if his setup is different once he is discharged.  Therapist discussed options of using a drop arm commode over toilet if needed to help equal height.  if height of toilet is similar and it has a tank, he can complete scoot pivot transfer to toilet.  He declines attempting  this stating he catches the wheel when he tries this.  discussed alternative ways to try and determine how far forward he can scoot and then angle his body so he can avoid the wheel.  Pt declines attempting and continues to repeat over and over what he is already said.  Therapist tries to rationalize with him that there are only two options, scoot laterally or scoot anteriorly.  Pt declines to try the lateral scoot and peseverates on not knowing how his bathroom is going to be setup whereever he gets discharged to.  Therapist tried to encourage transfer to the bed where more room is available to help with determining best angle of transfer but pt declines and states "I'm going to eat now".  Feel he is close to baseline for OT function and declines other options given to determine possible transfer scenarios.  Will sign off for OT services at this time.      Cognition Arousal/Alertness: Awake/alert Behavior During Therapy: WFL for tasks assessed/performed Overall Cognitive Status: No family/caregiver present to determine baseline cognitive functioning Area of Impairment: Awareness, Problem solving                   Current Attention Level: Sustained   Following Commands: Follows one step commands consistently     Problem Solving: Requires verbal cues General Comments: Pt talks in circles with decreased awareness of conformation of information therapist is trying to communicate to him.  He lacks insight into different techniques when explained options, and declines to attempt.                   Pertinent Vitals/ Pain       Pain Assessment Pain Assessment: Faces Faces Pain Scale: No hurt            Progress Toward Goals  OT Goals(current goals can now be found in the care plan section)  Progress towards OT goals: Goals met/education completed, patient discharged from [place] Other (comment) (Pt discharged from OT services based on reaching modified independent level  for most goals and declining to participate.)       AM-PAC OT "6 Clicks" Daily Activity     Outcome Measure   Help from another person eating meals?: None Help from another person taking care of personal grooming?: None Help from another person toileting, which includes using toliet, bedpan, or urinal?: None Help from another person bathing (including washing, rinsing, drying)?: None Help from another person to put on and taking off regular upper body clothing?: None Help from another person to put on and taking off regular lower body clothing?: None 6 Click Score: 24    End of Session Equipment Utilized During Treatment: Other (comment) (wheelchair)      Activity Tolerance Other (comment) (Pt self limiting)  Patient Left in chair;with call bell/phone within reach;with family/visitor present           Time: 1354-1416 OT Time Calculation (min): 22 min  Charges: OT General Charges $OT Visit: 1 Visit OT Treatments $Self Care/Home Management : 8-22 mins  Clyda Greener, OTR/L [place]  Office [phone] 02/19/2023  2:43 PM

## 2023-02-20 DIAGNOSIS — I96 Gangrene, not elsewhere classified: Secondary | ICD-10-CM | POA: Diagnosis not present

## 2023-02-20 MED ORDER — LIDOCAINE VISCOUS HCL 2 % MT SOLN
15.0000 mL | OROMUCOSAL | Status: DC | PRN
  Administered 2023-02-20 – 2023-02-22 (×5): 15 mL via OROMUCOSAL
  Filled 2023-02-20 (×9): qty 15

## 2023-02-20 NOTE — Plan of Care (Signed)
  Problem: Nutrition: Goal: Adequate nutrition will be maintained Outcome: Progressing   Problem: Coping: Goal: Level of anxiety will decrease Outcome: Progressing   Problem: Pain Managment: Goal: General experience of comfort will improve Outcome: Progressing   

## 2023-02-20 NOTE — Plan of Care (Signed)
  Problem: Coping: Goal: Level of anxiety will decrease Outcome: Progressing   Problem: Education: Goal: Knowledge of General Education information will improve Description: Including pain rating scale, medication(s)/side effects and non-pharmacologic comfort measures Outcome: Progressing   Problem: Self-Care: Goal: Ability to meet self-care needs will improve Outcome: Progressing

## 2023-02-20 NOTE — Progress Notes (Signed)
[facility] PROGRESS NOTE    Progress Note  [name]  [id number] DOB: October 15, 1966 DOA: 12/07/2022 PCP: Marliss Coots, NP     Brief Narrative:   [name] is an 57 y.o. 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 Has completed 2 weeks of Doxy.  Physical therapy evaluated the patient. Continue Neurontin, oxycodone and long-acting narcotics for pain. Last hemoglobin A1c of 6.1 on 12/07/2020 . Awaiting skilled nursing facility placement.  Essential hypertension: Relatively stable, continue on the amlodipine and Lasix.  Chronic combined systolic and diastolic heart failure: Appears to be compensated continue Lasix, has been refusing labs intermittently. Potassium seems to be stable. Currently on oral Lasix.  COPD: Stable continue inhalers.  Anxiety: Continue Atarax.  GERD: Continue PPI.  Constipation: Continue bowel regimen.  Schizoaffective disorder, bipolar type ([place]) Noted   DVT prophylaxis: lovenox Family Communication:none Status is: Inpatient Remains inpatient appropriate because: Awaiting placement.    Code Status:     Code Status Orders  (From admission, onward)           Start     Ordered   12/07/22 1711  Full code  Continuous       Question Answer Comment  By: Other   Comments: other      12/07/22 1712           Code Status History     Date Active Date Inactive Code Status Order ID Comments User Context   11/24/2022 0745 12/03/2022 1902 Full Code  [id number]  Reubin Milan, MD ED   11/03/2022 0253 11/03/2022 1937 Full Code [id number]  Larene Pickett, PA-C ED   08/17/2022 0142 10/09/2022 2035 Full Code [id number]  Clance Boll, MD ED   07/12/2022 1026 07/25/2022 2113 Full Code [id number]  Reubin Milan, MD ED   04/04/2022 1240 04/11/2022 1710 Full Code [id number]  Reubin Milan, MD ED   04/03/2022 1411 04/04/2022 1240 Full Code [id number]  Dorothyann Peng, PA-C ED   03/23/2022 2112 03/31/2022 1517 Full Code [id number]  Elodia Florence., MD Inpatient   02/26/2022 1812 03/07/2022 1908 Full Code [id number]  Jonnie Finner, DO Inpatient   01/29/2022 1515 02/05/2022 1533 Full Code [id number]  Orma Flaming, MD ED   12/03/2021 0841 12/09/2021 1457 Full Code [id number]  Vernelle Emerald, MD ED   11/24/2021 2315 11/25/2021 2004 Full Code [id number]  Muthersbaugh, Gwenlyn Perking ED   11/13/2021 1653 11/21/2021 1748 Full Code [id number]  Reubin Milan, MD Inpatient   11/02/2021 2000 11/04/2021 2331 Full Code [id number]  Varney Biles, MD ED   07/03/2021 1143 07/05/2021 0057 Full Code [id number]  Regan Lemming, MD ED   03/02/2021 1936 03/12/2021 1811 Full Code [id number]  Lenore Cordia, MD ED   02/10/2021 2345 02/13/2021 1958 Full Code [id number]  Larene Pickett, PA-C ED   02/09/2021 1953 02/10/2021 1536 Full Code [id number]  Arnaldo Natal, MD ED   12/17/2020 1516 02/03/2021 1650 Full Code [id number]  Gardiner Barefoot, NP ED   12/08/2020 1336 12/10/2020 1931 Full Code [id number]  Alcus Dad, MD ED  11/13/2020 2028 11/16/2020 1520 Full Code [id number]  Jonnie Finner, DO ED   10/26/2020 1039 10/31/2020 2157 Full Code [id number]  Harold Hedge, MD ED   10/15/2020 [phone] 10/18/2020 1810 Full Code [id number]  Chotiner, Yevonne Aline, MD Inpatient   10/02/2020 2247 10/07/2020 1809 Full Code [id number]  Vianne Bulls, MD ED   09/25/2020 1559 09/26/2020 2002 Full Code [id number]  Para Skeans, MD ED   09/09/2020 1251 09/11/2020 1546 Full Code [id number]  Dessa Phi,  DO Inpatient   09/07/2020 1753 09/09/2020 1251 DNR [id number]  Jonnie Finner, DO Inpatient   07/21/2020 1700 07/23/2020 2001 Full Code [id number]  Mosetta Anis, MD ED   07/07/2020 1529 07/07/2020 2109 Full Code [id number]  Wyvonnia Dusky, MD ED   06/24/2019 0131 06/24/2019 1630 Full Code [id number]  Rozetta Nunnery, NP Inpatient   06/24/2019 0130 06/24/2019 0131 Full Code [id number]  Mordecai Maes, NP Inpatient   06/21/2019 0941 06/22/2019 2007 Full Code [id number]  Rory Percy, DO Inpatient   05/24/2019 1117 05/26/2019 1836 Full Code [id number]  Everrett Coombe, MD ED   04/18/2019 0149 04/19/2019 1628 DNR [id number]  Ina Homes, MD ED   04/04/2019 0555 04/06/2019 1413 Full Code [id number]  Lars Mage, MD ED   03/27/2019 2130 03/29/2019 1900 Full Code [id number]  Mosetta Anis, MD ED   03/04/2019 1600 03/09/2019 1222 Full Code [id number]  Ledell Noss, MD ED   02/13/2018 0439 02/14/2018 1333 Full Code [id number]  Ezequiel Essex, MD ED   12/26/2016 0547 12/29/2016 2021 Full Code [id number]  Clapacs, Madie Reno, MD Inpatient   11/27/2016 1541 11/28/2016 1319 Full Code [id number]  Dalia Heading, PA-C ED   10/29/2016 1415 10/31/2016 1150 Full Code [id number]  Davonna Belling, MD ED   09/06/2016 2230 09/07/2016 1350 Full Code [id number]  Orlie Dakin, MD ED   09/06/2016 0934 09/06/2016 1529 Full Code [id number]  Macarthur Critchley, MD ED   09/02/2016 0045 09/04/2016 2127 Full Code [id number]  Junius Creamer, NP ED   07/30/2016 1357 08/06/2016 2031 Full Code [id number]  Nanci Pina, [place] Inpatient   07/30/2016 0143 07/30/2016 1153 Full Code [id number]  Orpah Greek, MD ED   05/30/2016 1622 06/05/2016 1454 Full Code [id number]  Niel Hummer, NP Inpatient   05/29/2016 1635 05/30/2016 1622 Full Code [id number]  Deno Etienne, DO ED   12/02/2011 0245 12/12/2011 0720 Full Code [id number]  Carlisle Cater, [place] ED         IV Access:   Peripheral IV   Procedures and diagnostic studies:   No results found.   Medical Consultants:    None.   Subjective:    [name] no complaints.  Objective:    Vitals:   02/18/23 1941 02/19/23 0253 02/19/23 0838 02/19/23 2026  BP: 131/85 126/70  127/77  Pulse: 78 76  69  Resp: 17 17  17   Temp: 97.6 F (36.4 C)   98.3 F (36.8 C)  TempSrc: Oral     SpO2: 95% 99% 97% 97%  Weight:      Height:       SpO2: 97 % O2 Flow Rate (L/min): 2 L/min   Intake/Output Summary (Last 24 hours) at 02/20/2023 0730 Last data filed at 02/20/2023 0534 Gross per 24 hour  Intake 1194 ml  Output 1400 ml  Net -206 ml    Filed Weights   02/11/23 0429 02/15/23 0555 02/17/23 0405  Weight: 89.8 kg 84.2 kg 86.2 kg    Exam: General  exam: In no acute distress. Respiratory system: Good air movement and clear to auscultation. Cardiovascular system: S1 & S2 heard, RRR. No JVD. Gastrointestinal system: Abdomen is nondistended, soft and nontender.  Skin: No rashes, lesions or ulcers Psychiatry: Judgement and insight appear normal. Mood & affect appropriate.    Data Reviewed:    Labs: Basic Metabolic Panel: Recent Labs  Lab 02/13/23 1201 02/15/23 0721 02/16/23 0853 02/17/23 0223 02/18/23 0304  NA 137 137 138 135 139  K 3.5 4.0 3.7 3.6 3.8  CL 104 107 102 103 101  CO2 23 19* 24 22 25   GLUCOSE 160* 109* 141* 99 100*  BUN 14 19 17 16 14   CREATININE 0.89 1.07 0.94 0.96 1.04  CALCIUM 9.0 9.0 9.3 9.2 9.5    GFR Estimated Creatinine Clearance: 88.3 mL/min (by C-G formula based on SCr of 1.04 mg/dL). Liver Function Tests: No results for input(s): "AST", "ALT", "ALKPHOS", "BILITOT", "PROT", "ALBUMIN" in the last 168 hours. No results for input(s): "LIPASE", "AMYLASE" in the last 168 hours. No results for input(s): "AMMONIA" in the last 168 hours. Coagulation profile No results for input(s): "INR", "PROTIME" in the last 168 hours. COVID-19 Labs  No results for input(s): "DDIMER", "FERRITIN", "LDH", "CRP" in the last 72 hours.  Lab Results  Component Value Date    SARSCOV2NAA NEGATIVE 11/23/2022   SARSCOV2NAA NEGATIVE 11/03/2022   [place] NEGATIVE 07/12/2022   [place] NEGATIVE 04/03/2022    CBC: No results for input(s): "WBC", "NEUTROABS", "HGB", "HCT", "MCV", "PLT" in the last 168 hours. Cardiac Enzymes: No results for input(s): "CKTOTAL", "CKMB", "CKMBINDEX", "TROPONINI" in the last 168 hours. BNP (last 3 results) No results for input(s): "PROBNP" in the last 8760 hours. CBG: No results for input(s): "GLUCAP" in the last 168 hours. D-Dimer: No results for input(s): "DDIMER" in the last 72 hours. Hgb A1c: No results for input(s): "HGBA1C" in the last 72 hours. Lipid Profile: No results for input(s): "CHOL", "HDL", "LDLCALC", "TRIG", "CHOLHDL", "LDLDIRECT" in the last 72 hours. Thyroid function studies: No results for input(s): "TSH", "T4TOTAL", "T3FREE", "THYROIDAB" in the last 72 hours.  Invalid input(s): "FREET3" Anemia work up: No results for input(s): "VITAMINB12", "FOLATE", "FERRITIN", "TIBC", "IRON", "RETICCTPCT" in the last 72 hours. Sepsis Labs: No results for input(s): "PROCALCITON", "WBC", "LATICACIDVEN" in the last 168 hours. Microbiology No results found for this or any previous visit (from the past 240 hour(s)).   Medications:    amLODipine  10 mg Oral Daily   vitamin C  1,000 mg Oral Daily   collagenase   Topical Daily   furosemide  40 mg Oral Daily   gabapentin  900 mg Oral TID   isosorbide mononitrate  15 mg Oral Daily   melatonin  3 mg Oral QHS   mometasone-formoterol  2 puff Inhalation BID   morphine  30 mg Oral Q12H   multivitamin with minerals  1 tablet Oral Daily   pantoprazole  40 mg Oral Daily   thiamine  100 mg Oral Daily   Continuous Infusions:  magnesium sulfate bolus IVPB        LOS: 75 days   Charlynne Cousins  [facility]  02/20/2023, 7:30 AM

## 2023-02-21 DIAGNOSIS — I96 Gangrene, not elsewhere classified: Secondary | ICD-10-CM | POA: Diagnosis not present

## 2023-02-21 NOTE — Progress Notes (Signed)
CSW spoke with patient in cafeteria to discuss updates. Patient informed CSW that he has spoken with Junie Panning ([place]) and [facility]) this week and they have provided him with updates as well. Patient states he continues to work with PT and OT to successfully complete his ADL's independently.  Madilyn Fireman, MSW, [name] Transitions of Care  Clinical Social Worker II [phone]

## 2023-02-21 NOTE — Progress Notes (Signed)
[facility] PROGRESS NOTE    Progress Note  [name]  [id number] DOB: 11-Jan-1966 DOA: 12/07/2022 PCP: Marliss Coots, NP     Brief Narrative:   [name] is an 57 y.o. 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 Has completed 2 weeks of Doxy.  Physical therapy evaluated the patient. Continue Neurontin, oxycodone and long-acting narcotics for pain. Last hemoglobin A1c of 6.1 on 12/07/2020 . Awaiting skilled nursing facility placement.  Essential hypertension: Relatively stable, continue on the amlodipine and Lasix.  Chronic combined systolic and diastolic heart failure: Appears to be compensated continue Lasix, has been refusing labs intermittently. Potassium seems to be stable. Currently on oral Lasix.  COPD: Stable continue inhalers.  Anxiety: Continue Atarax.  GERD: Continue PPI.  Constipation: Continue bowel regimen.  Schizoaffective disorder, bipolar type ([place]) Noted   DVT prophylaxis: lovenox Family Communication:none Status is: Inpatient Remains inpatient appropriate because: Awaiting placement.    Code Status:     Code Status Orders  (From admission, onward)           Start     Ordered   12/07/22 1711  Full code  Continuous       Question Answer Comment  By: Other   Comments: other      12/07/22 1712           Code Status History     Date Active Date Inactive Code Status Order ID Comments User Context   11/24/2022 0745 12/03/2022 1902 Full Code  [id number]  Reubin Milan, MD ED   11/03/2022 0253 11/03/2022 1937 Full Code [id number]  Larene Pickett, PA-C ED   08/17/2022 0142 10/09/2022 2035 Full Code [id number]  Clance Boll, MD ED   07/12/2022 1026 07/25/2022 2113 Full Code [id number]  Reubin Milan, MD ED   04/04/2022 1240 04/11/2022 1710 Full Code [id number]  Reubin Milan, MD ED   04/03/2022 1411 04/04/2022 1240 Full Code [id number]  Dorothyann Peng, PA-C ED   03/23/2022 2112 03/31/2022 1517 Full Code [id number]  Elodia Florence., MD Inpatient   02/26/2022 1812 03/07/2022 1908 Full Code [id number]  Jonnie Finner, DO Inpatient   01/29/2022 1515 02/05/2022 1533 Full Code [id number]  Orma Flaming, MD ED   12/03/2021 0841 12/09/2021 1457 Full Code [id number]  Vernelle Emerald, MD ED   11/24/2021 2315 11/25/2021 2004 Full Code [id number]  Muthersbaugh, Gwenlyn Perking ED   11/13/2021 1653 11/21/2021 1748 Full Code [id number]  Reubin Milan, MD Inpatient   11/02/2021 2000 11/04/2021 2331 Full Code [id number]  Varney Biles, MD ED   07/03/2021 1143 07/05/2021 0057 Full Code [id number]  Regan Lemming, MD ED   03/02/2021 1936 03/12/2021 1811 Full Code [id number]  Lenore Cordia, MD ED   02/10/2021 2345 02/13/2021 1958 Full Code [id number]  Larene Pickett, PA-C ED   02/09/2021 1953 02/10/2021 1536 Full Code [id number]  Arnaldo Natal, MD ED   12/17/2020 1516 02/03/2021 1650 Full Code [id number]  Gardiner Barefoot, NP ED   12/08/2020 1336 12/10/2020 1931 Full Code [id number]  Alcus Dad, MD ED   11/13/2020 2028  11/16/2020 1520 Full Code [id number]  Jonnie Finner, DO ED   10/26/2020 1039 10/31/2020 2157 Full Code [id number]  Harold Hedge, MD ED   10/15/2020 [id number] 10/18/2020 1810 Full Code [id number]  Chotiner, Yevonne Aline, MD Inpatient   10/02/2020 2247 10/07/2020 1809 Full Code [id number]  Vianne Bulls, MD ED   09/25/2020 1559 09/26/2020 2002 Full Code [id number]  Para Skeans, MD ED   09/09/2020 1251 09/11/2020 1546 Full Code [id number]  Dessa Phi,  DO Inpatient   09/07/2020 1753 09/09/2020 1251 DNR [id number]  Jonnie Finner, DO Inpatient   07/21/2020 1700 07/23/2020 2001 Full Code [id number]  Mosetta Anis, MD ED   07/07/2020 1529 07/07/2020 2109 Full Code [id number]  Wyvonnia Dusky, MD ED   06/24/2019 0131 06/24/2019 1630 Full Code [id number]  Rozetta Nunnery, NP Inpatient   06/24/2019 0130 06/24/2019 0131 Full Code [id number]  Mordecai Maes, NP Inpatient   06/21/2019 0941 06/22/2019 2007 Full Code [id number]  Rory Percy, DO Inpatient   05/24/2019 1117 05/26/2019 1836 Full Code [id number]  Everrett Coombe, MD ED   04/18/2019 0149 04/19/2019 1628 DNR [id number]  Ina Homes, MD ED   04/04/2019 0555 04/06/2019 1413 Full Code [id number]  Lars Mage, MD ED   03/27/2019 2130 03/29/2019 1900 Full Code [id number]  Mosetta Anis, MD ED   03/04/2019 1600 03/09/2019 1222 Full Code [id number]  Ledell Noss, MD ED   02/13/2018 0439 02/14/2018 1333 Full Code [id number]  Ezequiel Essex, MD ED   12/26/2016 0547 12/29/2016 2021 Full Code [id number]  Clapacs, Madie Reno, MD Inpatient   11/27/2016 1541 11/28/2016 1319 Full Code [id number]  Dalia Heading, PA-C ED   10/29/2016 1415 10/31/2016 1150 Full Code [id number]  Davonna Belling, MD ED   09/06/2016 2230 09/07/2016 1350 Full Code [id number]  Orlie Dakin, MD ED   09/06/2016 0934 09/06/2016 1529 Full Code [id number]  Macarthur Critchley, MD ED   09/02/2016 0045 09/04/2016 2127 Full Code [id number]  Junius Creamer, NP ED   07/30/2016 1357 08/06/2016 2031 Full Code [id number]  Nanci Pina, [place] Inpatient   07/30/2016 0143 07/30/2016 1153 Full Code [id number]  Orpah Greek, MD ED   05/30/2016 1622 06/05/2016 1454 Full Code [id number]  Niel Hummer, NP Inpatient   05/29/2016 1635 05/30/2016 1622 Full Code [id number]  Deno Etienne, DO ED   12/02/2011 0245 12/12/2011 0720 Full Code [id number]  Carlisle Cater, [place] ED         IV Access:   Peripheral IV   Procedures and diagnostic studies:   No results found.   Medical Consultants:    None.   Subjective:    [name] no complaints.  Objective:    Vitals:   02/19/23 0838 02/19/23 2026 02/20/23 0831 02/20/23 2114  BP:  127/77  104/81  Pulse:  69  75  Resp:  17  18  Temp:  98.3 F (36.8 C)  97.9 F (36.6 C)  TempSrc:    Oral  SpO2: 97% 97% 98% 96%  Weight:      Height:       SpO2: 96 % O2 Flow Rate (L/min): 2 L/min   Intake/Output Summary (Last 24 hours) at 02/21/2023 0800 Last data filed at 02/20/2023 2114 Gross per 24 hour  Intake 1294 ml  Output 400 ml  Net 894 ml    Filed Weights   02/11/23 0429 02/15/23 0555 02/17/23 0405  Weight: 89.8 kg 84.2 kg 86.2 kg    Exam: General exam: In  no acute distress. Respiratory system: Good air movement and clear to auscultation. Cardiovascular system: S1 & S2 heard, RRR. No JVD. Gastrointestinal system: Abdomen is nondistended, soft and nontender.  Skin: No rashes, lesions or ulcers Psychiatry: Judgement and insight appear normal. Mood & affect appropriate.    Data Reviewed:    Labs: Basic Metabolic Panel: Recent Labs  Lab 02/15/23 0721 02/16/23 0853 02/17/23 0223 02/18/23 0304  NA 137 138 135 139  K 4.0 3.7 3.6 3.8  CL 107 102 103 101  CO2 19* 24 22 25   GLUCOSE 109* 141* 99 100*  BUN 19 17 16 14   CREATININE 1.07 0.94 0.96 1.04  CALCIUM 9.0 9.3 9.2 9.5    GFR Estimated Creatinine Clearance: 88.3 mL/min (by C-G formula based on SCr of 1.04 mg/dL). Liver Function Tests: No results for input(s): "AST", "ALT", "ALKPHOS", "BILITOT", "PROT", "ALBUMIN" in the last 168 hours. No results for input(s): "LIPASE", "AMYLASE" in the last 168 hours. No results for input(s): "AMMONIA" in the last 168 hours. Coagulation profile No results for input(s): "INR", "PROTIME" in the last 168 hours. COVID-19 Labs  No results for input(s): "DDIMER", "FERRITIN", "LDH", "CRP" in the last 72 hours.  Lab Results  Component Value Date   SARSCOV2NAA NEGATIVE 11/23/2022   SARSCOV2NAA NEGATIVE  11/03/2022   [place] NEGATIVE 07/12/2022   [place] NEGATIVE 04/03/2022    CBC: No results for input(s): "WBC", "NEUTROABS", "HGB", "HCT", "MCV", "PLT" in the last 168 hours. Cardiac Enzymes: No results for input(s): "CKTOTAL", "CKMB", "CKMBINDEX", "TROPONINI" in the last 168 hours. BNP (last 3 results) No results for input(s): "PROBNP" in the last 8760 hours. CBG: No results for input(s): "GLUCAP" in the last 168 hours. D-Dimer: No results for input(s): "DDIMER" in the last 72 hours. Hgb A1c: No results for input(s): "HGBA1C" in the last 72 hours. Lipid Profile: No results for input(s): "CHOL", "HDL", "LDLCALC", "TRIG", "CHOLHDL", "LDLDIRECT" in the last 72 hours. Thyroid function studies: No results for input(s): "TSH", "T4TOTAL", "T3FREE", "THYROIDAB" in the last 72 hours.  Invalid input(s): "FREET3" Anemia work up: No results for input(s): "VITAMINB12", "FOLATE", "FERRITIN", "TIBC", "IRON", "RETICCTPCT" in the last 72 hours. Sepsis Labs: No results for input(s): "PROCALCITON", "WBC", "LATICACIDVEN" in the last 168 hours. Microbiology No results found for this or any previous visit (from the past 240 hour(s)).   Medications:    amLODipine  10 mg Oral Daily   vitamin C  1,000 mg Oral Daily   collagenase   Topical Daily   furosemide  40 mg Oral Daily   gabapentin  900 mg Oral TID   isosorbide mononitrate  15 mg Oral Daily   melatonin  3 mg Oral QHS   mometasone-formoterol  2 puff Inhalation BID   morphine  30 mg Oral Q12H   multivitamin with minerals  1 tablet Oral Daily   pantoprazole  40 mg Oral Daily   thiamine  100 mg Oral Daily   Continuous Infusions:  magnesium sulfate bolus IVPB        LOS: 76 days   Charlynne Cousins  [facility]  02/21/2023, 8:00 AM

## 2023-02-22 DIAGNOSIS — I96 Gangrene, not elsewhere classified: Secondary | ICD-10-CM | POA: Diagnosis not present

## 2023-02-22 MED ORDER — CARVEDILOL 3.125 MG PO TABS
3.1250 mg | ORAL_TABLET | Freq: Two times a day (BID) | ORAL | Status: DC
  Administered 2023-02-22 – 2023-04-18 (×107): 3.125 mg via ORAL
  Filled 2023-02-22 (×112): qty 1

## 2023-02-22 MED ORDER — MAGIC MOUTHWASH
10.0000 mL | Freq: Four times a day (QID) | ORAL | Status: DC
  Administered 2023-02-22 – 2023-03-06 (×23): 10 mL via ORAL
  Filled 2023-02-22 (×51): qty 10

## 2023-02-22 NOTE — Plan of Care (Signed)
  Problem: Activity: Goal: Ability to perform//tolerate increased activity and mobilize with assistive devices will improve Outcome: Progressing   Problem: Self-Care: Goal: Ability to meet self-care needs will improve Outcome: Progressing

## 2023-02-22 NOTE — Progress Notes (Signed)
[facility] PROGRESS NOTE    Progress Note  [name]  [id number] DOB: [birth year] DOA: 12/07/2022 PCP: Marliss Coots, NP     Brief Narrative:   [name] is an 57 y.o. 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 Has completed 2 weeks of Doxy.  Physical therapy evaluated the patient. Continue Neurontin, oxycodone and long-acting narcotics for pain. Last hemoglobin A1c of 6.1 on 12/07/2020 . Awaiting skilled nursing facility placement.  Essential hypertension: Relatively stable, continue on the amlodipine and Lasix.  Chronic combined systolic and diastolic heart failure: Appears to be compensated continue Lasix, has been refusing labs intermittently. Potassium seems to be stable. Currently on oral Lasix.  COPD: Stable continue inhalers.  Anxiety: Continue Atarax.  GERD: Continue PPI.  Constipation: Continue bowel regimen.  Schizoaffective disorder, bipolar type ([place]) Noted   DVT prophylaxis: lovenox Family Communication:none Status is: Inpatient Remains inpatient appropriate because: Awaiting placement.    Code Status:     Code Status Orders  (From admission, onward)           Start     Ordered   12/07/22 1711  Full code  Continuous       Question Answer Comment  By: Other   Comments: other      12/07/22 1712           Code Status History     Date Active Date Inactive Code Status Order ID Comments User Context   11/24/2022 0745 12/03/2022 1902 Full Code  [id number]  Reubin Milan, MD ED   11/03/2022 0253 11/03/2022 1937 Full Code [id number]  Larene Pickett, PA-C ED   08/17/2022 0142 10/09/2022 2035 Full Code [id number]  Clance Boll, MD ED   07/12/2022 1026 07/25/2022 2113 Full Code [id number]  Reubin Milan, MD ED   04/04/2022 1240 04/11/2022 1710 Full Code [id number]  Reubin Milan, MD ED   04/03/2022 1411 04/04/2022 1240 Full Code [id number]  Dorothyann Peng, PA-C ED   03/23/2022 2112 03/31/2022 1517 Full Code [id number]  Elodia Florence., MD Inpatient   02/26/2022 1812 03/07/2022 1908 Full Code [id number]  Jonnie Finner, DO Inpatient   01/29/2022 1515 02/05/2022 1533 Full Code [id number]  Orma Flaming, MD ED   12/03/2021 0841 12/09/2021 1457 Full Code [id number]  Vernelle Emerald, MD ED   11/24/2021 2315 11/25/2021 2004 Full Code [id number]  Muthersbaugh, Gwenlyn Perking ED   11/13/2021 1653 11/21/2021 1748 Full Code [id number]  Reubin Milan, MD Inpatient   11/02/2021 2000 11/04/2021 2331 Full Code [id number]  Varney Biles, MD ED   07/03/2021 1143 07/05/2021 0057 Full Code [id number]  Regan Lemming, MD ED   03/02/2021 1936 03/12/2021 1811 Full Code [id number]  Lenore Cordia, MD ED   02/10/2021 2345 02/13/2021 1958 Full Code [id number]  Larene Pickett, PA-C ED   02/09/2021 1953 02/10/2021 1536 Full Code [id number]  Arnaldo Natal, MD ED   12/17/2020 1516 02/03/2021 1650 Full Code [id number]  Gardiner Barefoot, NP ED   12/08/2020 1336 12/10/2020 1931 Full Code [id number]  Alcus Dad, MD ED   11/13/2020 2028  11/16/2020 1520 Full Code [id number]  Jonnie Finner, DO ED   10/26/2020 1039 10/31/2020 2157 Full Code [id number]  Harold Hedge, MD ED   10/15/2020 430 098 8317 10/18/2020 1810 Full Code [id number]  Chotiner, Yevonne Aline, MD Inpatient   10/02/2020 2247 10/07/2020 1809 Full Code [id number]  Vianne Bulls, MD ED   09/25/2020 1559 09/26/2020 2002 Full Code [id number]  Para Skeans, MD ED   09/09/2020 1251 09/11/2020 1546 Full Code [id number]  Dessa Phi,  DO Inpatient   09/07/2020 1753 09/09/2020 1251 DNR [id number]  Jonnie Finner, DO Inpatient   07/21/2020 1700 07/23/2020 2001 Full Code [id number]  Mosetta Anis, MD ED   07/07/2020 1529 07/07/2020 2109 Full Code [id number]  Wyvonnia Dusky, MD ED   06/24/2019 0131 06/24/2019 1630 Full Code [id number]  Rozetta Nunnery, NP Inpatient   06/24/2019 0130 06/24/2019 0131 Full Code [id number]  Mordecai Maes, NP Inpatient   06/21/2019 0941 06/22/2019 2007 Full Code [id number]  Rory Percy, DO Inpatient   05/24/2019 1117 05/26/2019 1836 Full Code [id number]  Everrett Coombe, MD ED   04/18/2019 0149 04/19/2019 1628 DNR [id number]  Ina Homes, MD ED   04/04/2019 0555 04/06/2019 1413 Full Code [id number]  Lars Mage, MD ED   03/27/2019 2130 03/29/2019 1900 Full Code [id number]  Mosetta Anis, MD ED   03/04/2019 1600 03/09/2019 1222 Full Code [id number]  Ledell Noss, MD ED   02/13/2018 0439 02/14/2018 1333 Full Code [id number]  Ezequiel Essex, MD ED   12/26/2016 0547 12/29/2016 2021 Full Code [id number]  Clapacs, Madie Reno, MD Inpatient   11/27/2016 1541 11/28/2016 1319 Full Code [id number]  Dalia Heading, PA-C ED   10/29/2016 1415 10/31/2016 1150 Full Code [id number]  Davonna Belling, MD ED   09/06/2016 2230 09/07/2016 1350 Full Code [id number]  Orlie Dakin, MD ED   09/06/2016 0934 09/06/2016 1529 Full Code [id number]  Macarthur Critchley, MD ED   09/02/2016 0045 09/04/2016 2127 Full Code [id number]  Junius Creamer, NP ED   07/30/2016 1357 08/06/2016 2031 Full Code [id number]  Nanci Pina, [place] Inpatient   07/30/2016 0143 07/30/2016 1153 Full Code [id number]  Orpah Greek, MD ED   05/30/2016 1622 06/05/2016 1454 Full Code [id number]  Niel Hummer, NP Inpatient   05/29/2016 1635 05/30/2016 1622 Full Code [id number]  Deno Etienne, DO ED   12/02/2011 0245 12/12/2011 0720 Full Code [id number]  Carlisle Cater, [place] ED         IV Access:   Peripheral IV   Procedures and diagnostic studies:   No results found.   Medical Consultants:    None.   Subjective:    [name] no complaints.  Objective:    Vitals:   02/21/23 2119 02/22/23 0256 02/22/23 0410 02/22/23 0753  BP: (!) 159/98  (!) 149/86 (!) 166/90  Pulse: 87  74 80  Resp: 16  16   Temp: 98 F (36.7 C)   97.6 F (36.4 C)  TempSrc: Oral   Oral  SpO2: 99%  94% 99%  Weight:  87.1 kg    Height:       SpO2: 99 % O2 Flow Rate (L/min): 2 L/min   Intake/Output Summary (Last 24 hours) at 02/22/2023 0759 Last data filed at 02/22/2023 [id number] Gross per 24 hour  Intake 1194 ml  Output 2750 ml  Net -1556 ml    Filed Weights   02/17/23 0405 02/21/23 1008 02/22/23 0256  Weight: 86.2 kg 87.1 kg 87.1 kg  Exam: General exam: In no acute distress. Respiratory system: Good air movement and clear to auscultation. Cardiovascular system: S1 & S2 heard, RRR. No JVD. Gastrointestinal system: Abdomen is nondistended, soft and nontender.  Skin: No rashes, lesions or ulcers Psychiatry: Judgement and insight appear normal. Mood & affect appropriate.    Data Reviewed:    Labs: Basic Metabolic Panel: Recent Labs  Lab 02/16/23 0853 02/17/23 0223 02/18/23 0304  NA 138 135 139  K 3.7 3.6 3.8  CL 102 103 101  CO2 24 22 25   GLUCOSE 141* 99 100*  BUN 17 16 14   CREATININE 0.94 0.96 1.04  CALCIUM 9.3 9.2 9.5    GFR Estimated Creatinine Clearance: 88.8 mL/min (by C-G formula based on SCr of 1.04 mg/dL). Liver Function Tests: No results for input(s): "AST", "ALT", "ALKPHOS", "BILITOT", "PROT", "ALBUMIN" in the last 168 hours. No results for input(s): "LIPASE", "AMYLASE" in the last 168 hours. No results for input(s): "AMMONIA" in the last 168 hours. Coagulation profile No results for input(s): "INR", "PROTIME" in the last 168 hours. COVID-19 Labs  No results for input(s): "DDIMER", "FERRITIN", "LDH", "CRP" in the last 72 hours.  Lab Results  Component Value Date   SARSCOV2NAA NEGATIVE 11/23/2022   SARSCOV2NAA NEGATIVE 11/03/2022   [place]  NEGATIVE 07/12/2022   [place] NEGATIVE 04/03/2022    CBC: No results for input(s): "WBC", "NEUTROABS", "HGB", "HCT", "MCV", "PLT" in the last 168 hours. Cardiac Enzymes: No results for input(s): "CKTOTAL", "CKMB", "CKMBINDEX", "TROPONINI" in the last 168 hours. BNP (last 3 results) No results for input(s): "PROBNP" in the last 8760 hours. CBG: No results for input(s): "GLUCAP" in the last 168 hours. D-Dimer: No results for input(s): "DDIMER" in the last 72 hours. Hgb A1c: No results for input(s): "HGBA1C" in the last 72 hours. Lipid Profile: No results for input(s): "CHOL", "HDL", "LDLCALC", "TRIG", "CHOLHDL", "LDLDIRECT" in the last 72 hours. Thyroid function studies: No results for input(s): "TSH", "T4TOTAL", "T3FREE", "THYROIDAB" in the last 72 hours.  Invalid input(s): "FREET3" Anemia work up: No results for input(s): "VITAMINB12", "FOLATE", "FERRITIN", "TIBC", "IRON", "RETICCTPCT" in the last 72 hours. Sepsis Labs: No results for input(s): "PROCALCITON", "WBC", "LATICACIDVEN" in the last 168 hours. Microbiology No results found for this or any previous visit (from the past 240 hour(s)).   Medications:    amLODipine  10 mg Oral Daily   vitamin C  1,000 mg Oral Daily   collagenase   Topical Daily   furosemide  40 mg Oral Daily   gabapentin  900 mg Oral TID   isosorbide mononitrate  15 mg Oral Daily   melatonin  3 mg Oral QHS   mometasone-formoterol  2 puff Inhalation BID   morphine  30 mg Oral Q12H   multivitamin with minerals  1 tablet Oral Daily   pantoprazole  40 mg Oral Daily   thiamine  100 mg Oral Daily   Continuous Infusions:  magnesium sulfate bolus IVPB        LOS: 77 days   Charlynne Cousins  [facility]  02/22/2023, 7:59 AM

## 2023-02-22 NOTE — Progress Notes (Signed)
Pt c/o pain 10/10 because ulcer in mouth Md notified Aileen Fass, [place],MD gave new orders see MAR.

## 2023-02-23 DIAGNOSIS — I96 Gangrene, not elsewhere classified: Secondary | ICD-10-CM | POA: Diagnosis not present

## 2023-02-23 NOTE — Progress Notes (Signed)
CSW spoke with Clarene Critchley at [facility] who is requesting patient be placed in STR prior to being placed into the community in an independent living environment. CSW provided Clarene Critchley with PT note from 02/17/23 for review. CSW explained current barriers to STR at [facility].   CSW spoke with Kia at [facility] who states the facility does not have any Medicaid beds at this time.  CSW spoke with Freda Munro at [facility] who is agreeable to review referral - CSW sent secure email with clinicals attached.  Madilyn Fireman, MSW, [name] Transitions of Care  Clinical Social Worker II [phone]

## 2023-02-23 NOTE — Progress Notes (Signed)
[facility] PROGRESS NOTE    Progress Note  [name]  [id number] DOB: [birth year] DOA: 12/07/2022 PCP: Marliss Coots, NP     Brief Narrative:   [name] is an 57 y.o. 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 Has completed 2 weeks of Doxy.  Physical therapy evaluated the patient. Continue Neurontin, oxycodone and long-acting narcotics for pain. Last hemoglobin A1c of 6.1 on 12/07/2020 . Awaiting skilled nursing facility placement.  Essential hypertension: Relatively stable, continue on the amlodipine and Lasix.  Chronic combined systolic and diastolic heart failure: Appears to be compensated continue Lasix, has been refusing labs intermittently. Potassium seems to be stable. Currently on oral Lasix.  COPD: Stable continue inhalers.  Anxiety: Continue Atarax.  GERD: Continue PPI.  Constipation: Continue bowel regimen.  Schizoaffective disorder, bipolar type ([place]) Noted.  Mouth ulcer: Continue Magic mouthwash and lidocaine gel somewhat improved.   DVT prophylaxis: lovenox Family Communication:none Status is: Inpatient Remains inpatient appropriate because: Awaiting placement.    Code Status:     Code Status Orders  (From admission, onward)           Start     Ordered   12/07/22 1711  Full code  Continuous       Question Answer Comment  By: Other   Comments: other      12/07/22 1712           Code Status History     Date Active Date Inactive Code  Status Order ID Comments User Context   11/24/2022 0745 12/03/2022 1902 Full Code [id number]  Reubin Milan, MD ED   11/03/2022 0253 11/03/2022 1937 Full Code [id number]  Larene Pickett, PA-C ED   08/17/2022 0142 10/09/2022 2035 Full Code [id number]  Clance Boll, MD ED   07/12/2022 1026 07/25/2022 2113 Full Code [id number]  Reubin Milan, MD ED   04/04/2022 1240 04/11/2022 1710 Full Code [id number]  Reubin Milan, MD ED   04/03/2022 1411 04/04/2022 1240 Full Code [id number]  Dorothyann Peng, PA-C ED   03/23/2022 2112 03/31/2022 1517 Full Code [id number]  Elodia Florence., MD Inpatient   02/26/2022 1812 03/07/2022 1908 Full Code [id number]  Jonnie Finner, DO Inpatient   01/29/2022 1515 02/05/2022 1533 Full Code [id number]  Orma Flaming, MD ED   12/03/2021 0841 12/09/2021 1457 Full Code [id number]  Vernelle Emerald, MD ED   11/24/2021 2315 11/25/2021 2004 Full Code [id number]  Muthersbaugh, Gwenlyn Perking ED   11/13/2021 1653 11/21/2021 1748 Full Code [id number]  Reubin Milan, MD Inpatient   11/02/2021 2000 11/04/2021 2331 Full Code [id number]  Varney Biles, MD ED   07/03/2021 1143 07/05/2021 0057 Full Code [id number]  Regan Lemming, MD ED   03/02/2021 1936 03/12/2021 1811 Full Code [id number]  Lenore Cordia, MD ED   02/10/2021 2345 02/13/2021 1958 Full Code [id number]  Larene Pickett, PA-C ED   02/09/2021 1953 02/10/2021 1536 Full Code [id number]  Arnaldo Natal, MD ED   12/17/2020 1516 02/03/2021 1650 Full Code [id number]  Gardiner Barefoot, NP ED   12/08/2020 1336 12/10/2020 1931 Full  Code [id number]  Alcus Dad, MD ED   11/13/2020 2028 11/16/2020 1520 Full Code [id number]  Jonnie Finner, DO ED   10/26/2020 1039 10/31/2020 2157 Full Code [id number]  Harold Hedge, MD ED   10/15/2020 [id number] 10/18/2020 1810 Full Code [id number]  Chotiner, Yevonne Aline, MD Inpatient   10/02/2020 2247 10/07/2020 1809 Full Code [id number]  Vianne Bulls, MD ED   09/25/2020 1559 09/26/2020 2002 Full Code [id number]  Para Skeans, MD ED   09/09/2020 1251 09/11/2020 1546 Full Code [id number]  Dessa Phi, DO Inpatient   09/07/2020 1753 09/09/2020 1251 DNR [id number]  Jonnie Finner, DO Inpatient   07/21/2020 1700 07/23/2020 2001 Full Code [id number]  Mosetta Anis, MD ED   07/07/2020 1529 07/07/2020 2109 Full Code [id number]  Wyvonnia Dusky, MD ED   06/24/2019 0131 06/24/2019 1630 Full Code [id number]  Rozetta Nunnery, NP Inpatient   06/24/2019 0130 06/24/2019 0131 Full Code [id number]  Mordecai Maes, NP Inpatient   06/21/2019 0941 06/22/2019 2007 Full Code [id number]  Rory Percy, DO Inpatient   05/24/2019 1117 05/26/2019 1836 Full Code [id number]  Everrett Coombe, MD ED   04/18/2019 0149 04/19/2019 1628 DNR [id number]  Ina Homes, MD ED   04/04/2019 0555 04/06/2019 1413 Full Code [id number]  Lars Mage, MD ED   03/27/2019 2130 03/29/2019 1900 Full Code [id number]  Mosetta Anis, MD ED   03/04/2019 1600 03/09/2019 1222 Full Code [id number]  Ledell Noss, MD ED   02/13/2018 0439 02/14/2018 1333 Full Code [id number]  Ezequiel Essex, MD ED   12/26/2016 0547 12/29/2016 2021 Full Code [id number]  Clapacs, Madie Reno, MD Inpatient   11/27/2016 1541 11/28/2016 1319 Full Code [id number]  Dalia Heading, PA-C ED   10/29/2016 1415 10/31/2016 1150 Full Code [id number]  Davonna Belling, MD ED   09/06/2016 2230 09/07/2016 1350 Full Code [id number]  Orlie Dakin, MD ED   09/06/2016 0934 09/06/2016 1529 Full Code [id number]  Macarthur Critchley, MD ED   09/02/2016 0045 09/04/2016 2127 Full Code [id number]  Junius Creamer, NP ED   07/30/2016 1357 08/06/2016 2031 Full Code [id number]  Nanci Pina, [place] Inpatient   07/30/2016 0143 07/30/2016 1153 Full Code [id number]  Orpah Greek, MD ED   05/30/2016 1622 06/05/2016 1454 Full Code [id number]  Niel Hummer, NP Inpatient   05/29/2016 1635 05/30/2016 1622 Full Code [id number]  Deno Etienne, DO ED   12/02/2011 0245 12/12/2011 0720 Full Code [id number]  Carlisle Cater, [place] ED         IV Access:   Peripheral  IV   Procedures and diagnostic studies:   No results found.   Medical Consultants:   None.   Subjective:    [name] no complaints.  Objective:    Vitals:   02/22/23 1430 02/22/23 1918 02/23/23 0500 02/23/23 0748  BP: 116/75 121/70  115/73  Pulse: 72 72  77  Resp:  17    Temp: 97.7 F (36.5 C) 98 F (36.7 C)  98.4 F (36.9 C)  TempSrc: Oral   Oral  SpO2: 97% 97%  98%  Weight:   87.2 kg   Height:       SpO2: 98 % O2 Flow Rate (L/min): 2 L/min   Intake/Output Summary (Last 24 hours) at 02/23/2023 0922 Last data filed at 02/22/2023 2028 Gross per 24 hour  Intake 980 ml  Output 600 ml  Net 380 ml    Filed Weights   02/21/23 1008 02/22/23 0256 02/23/23 0500  Weight: 87.1 kg 87.1 kg 87.2 kg    Exam: General exam: In no acute distress. Respiratory system: Good air movement and clear to auscultation. Cardiovascular system: S1 & S2 heard, RRR. No JVD. Gastrointestinal system: Abdomen is nondistended, soft and nontender.  Skin: No rashes, lesions or ulcers Psychiatry: Judgement and insight appear normal. Mood & affect appropriate.    Data Reviewed:    Labs: Basic Metabolic Panel: Recent Labs  Lab 02/17/23 0223 02/18/23 0304  NA 135 139  K 3.6 3.8  CL 103 101  CO2 22 25  GLUCOSE 99 100*  BUN 16 14  CREATININE 0.96 1.04  CALCIUM 9.2 9.5    GFR Estimated Creatinine Clearance: 88.8 mL/min (by C-G formula based on SCr of 1.04 mg/dL). Liver Function Tests: No results for input(s): "AST", "ALT", "ALKPHOS", "BILITOT", "PROT", "ALBUMIN" in the last 168 hours. No results for input(s): "LIPASE", "AMYLASE" in the last 168 hours. No results for input(s): "AMMONIA" in the last 168 hours. Coagulation profile No results for input(s): "INR", "PROTIME" in the last 168 hours. COVID-19 Labs  No results for input(s): "DDIMER", "FERRITIN", "LDH", "CRP" in the last 72 hours.  Lab Results  Component Value Date   SARSCOV2NAA NEGATIVE 11/23/2022    SARSCOV2NAA NEGATIVE 11/03/2022   [place] NEGATIVE 07/12/2022   [place] NEGATIVE 04/03/2022    CBC: No results for input(s): "WBC", "NEUTROABS", "HGB", "HCT", "MCV", "PLT" in the last 168 hours. Cardiac Enzymes: No results for input(s): "CKTOTAL", "CKMB", "CKMBINDEX", "TROPONINI" in the last 168 hours. BNP (last 3 results) No results for input(s): "PROBNP" in the last 8760 hours. CBG: No results for input(s): "GLUCAP" in the last 168 hours. D-Dimer: No results for input(s): "DDIMER" in the last 72 hours. Hgb A1c: No results for input(s): "HGBA1C" in the last 72 hours. Lipid Profile: No results for input(s): "CHOL", "HDL", "LDLCALC", "TRIG", "CHOLHDL", "LDLDIRECT" in the last 72 hours. Thyroid function studies: No results for input(s): "TSH", "T4TOTAL", "T3FREE", "THYROIDAB" in the last 72 hours.  Invalid input(s): "FREET3" Anemia work up: No results for input(s): "VITAMINB12", "FOLATE", "FERRITIN", "TIBC", "IRON", "RETICCTPCT" in the last 72 hours. Sepsis Labs: No results for input(s): "PROCALCITON", "WBC", "LATICACIDVEN" in the last 168 hours. Microbiology No results found for this or any previous visit (from the past 240 hour(s)).   Medications:    amLODipine  10 mg Oral Daily   vitamin C  1,000 mg Oral Daily   carvedilol  3.125 mg Oral BID WC   collagenase   Topical Daily   furosemide  40 mg Oral Daily   gabapentin  900 mg Oral TID   isosorbide mononitrate  15 mg Oral Daily   magic mouthwash  10 mL Oral QID   melatonin  3 mg Oral QHS   mometasone-formoterol  2 puff Inhalation BID   morphine  30 mg Oral Q12H   multivitamin with minerals  1 tablet Oral Daily   pantoprazole  40 mg Oral Daily   thiamine  100 mg Oral Daily   Continuous Infusions:  magnesium sulfate bolus IVPB        LOS: 78 days   Charlynne Cousins  [facility]  02/23/2023, 9:22 AM

## 2023-02-23 NOTE — Plan of Care (Signed)
  Problem: Fluid Volume: Goal: Hemodynamic stability will improve Outcome: Progressing   Problem: Activity: Goal: Risk for activity intolerance will decrease Outcome: Progressing   Problem: Nutrition: Goal: Adequate nutrition will be maintained Outcome: Progressing

## 2023-02-24 DIAGNOSIS — I96 Gangrene, not elsewhere classified: Secondary | ICD-10-CM | POA: Diagnosis not present

## 2023-02-24 NOTE — Progress Notes (Signed)
[facility] PROGRESS NOTE    Progress Note  [name]  [id number] DOB: [birth year] DOA: 12/07/2022 PCP: Marliss Coots, NP     Brief Narrative:   [name] is an 57 y.o. 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 Has completed 2 weeks of Doxy.  Physical therapy evaluated the patient. Continue Neurontin, oxycodone and long-acting narcotics for pain. Last hemoglobin A1c of 6.1 on 12/07/2020 . Awaiting skilled nursing facility placement.  Essential hypertension: Relatively stable, continue on the amlodipine and Lasix.  Chronic combined systolic and diastolic heart failure: Appears to be compensated continue Lasix, has been refusing labs intermittently. Potassium seems to be stable. Currently on oral Lasix.  COPD: Stable continue inhalers.  Anxiety: Continue Atarax.  GERD: Continue PPI.  Constipation: Continue bowel regimen.  Schizoaffective disorder, bipolar type ([place]) Noted.  Mouth ulcer: Continue Magic mouthwash and lidocaine gel somewhat improved.   DVT prophylaxis: lovenox Family Communication:none Status is: Inpatient Remains inpatient appropriate because: Awaiting placement.    Code Status:     Code Status Orders  (From admission, onward)           Start     Ordered   12/07/22 1711  Full code  Continuous       Question Answer Comment  By: Other   Comments: other      12/07/22 1712           Code Status History     Date Active Date Inactive Code  Status Order ID Comments User Context   11/24/2022 0745 12/03/2022 1902 Full Code [id number]  Reubin Milan, MD ED   11/03/2022 0253 11/03/2022 1937 Full Code [id number]  Larene Pickett, PA-C ED   08/17/2022 0142 10/09/2022 2035 Full Code [id number]  Clance Boll, MD ED   07/12/2022 1026 07/25/2022 2113 Full Code [id number]  Reubin Milan, MD ED   04/04/2022 1240 04/11/2022 1710 Full Code [id number]  Reubin Milan, MD ED   04/03/2022 1411 04/04/2022 1240 Full Code [id number]  Dorothyann Peng, PA-C ED   03/23/2022 2112 03/31/2022 1517 Full Code [id number]  Elodia Florence., MD Inpatient   02/26/2022 1812 03/07/2022 1908 Full Code [id number]  Jonnie Finner, DO Inpatient   01/29/2022 1515 02/05/2022 1533 Full Code [id number]  Orma Flaming, MD ED   12/03/2021 0841 12/09/2021 1457 Full Code [id number]  Vernelle Emerald, MD ED   11/24/2021 2315 11/25/2021 2004 Full Code [id number]  Muthersbaugh, Gwenlyn Perking ED   11/13/2021 1653 11/21/2021 1748 Full Code [id number]  Reubin Milan, MD Inpatient   11/02/2021 2000 11/04/2021 2331 Full Code [id number]  Varney Biles, MD ED   07/03/2021 1143 07/05/2021 0057 Full Code [id number]  Regan Lemming, MD ED   03/02/2021 1936 03/12/2021 1811 Full Code [id number]  Lenore Cordia, MD ED   02/10/2021 2345 02/13/2021 1958 Full Code [id number]  Larene Pickett, PA-C ED   02/09/2021 1953 02/10/2021 1536 Full Code [id number]  Arnaldo Natal, MD ED   12/17/2020 1516 02/03/2021 1650 Full Code [id number]  Gardiner Barefoot, NP ED   12/08/2020 1336 12/10/2020 1931 Full  Code [id number]  Alcus Dad, MD ED   11/13/2020 2028 11/16/2020 1520 Full Code [id number]  Jonnie Finner, DO ED   10/26/2020 1039 10/31/2020 2157 Full Code [id number]  Harold Hedge, MD ED   10/15/2020 [id number] 10/18/2020 1810 Full Code [id number]  Chotiner, Yevonne Aline, MD Inpatient   10/02/2020 2247 10/07/2020 1809 Full Code [id number]  Vianne Bulls, MD ED   09/25/2020 1559 09/26/2020 2002 Full Code [id number]  Para Skeans, MD ED   09/09/2020 1251 09/11/2020 1546 Full Code [id number]  Dessa Phi, DO Inpatient   09/07/2020 1753 09/09/2020 1251 DNR [id number]  Jonnie Finner, DO Inpatient   07/21/2020 1700 07/23/2020 2001 Full Code [id number]  Mosetta Anis, MD ED   07/07/2020 1529 07/07/2020 2109 Full Code [id number]  Wyvonnia Dusky, MD ED   06/24/2019 0131 06/24/2019 1630 Full Code [id number]  Rozetta Nunnery, NP Inpatient   06/24/2019 0130 06/24/2019 0131 Full Code [id number]  Mordecai Maes, NP Inpatient   06/21/2019 0941 06/22/2019 2007 Full Code [id number]  Rory Percy, DO Inpatient   05/24/2019 1117 05/26/2019 1836 Full Code [id number]  Everrett Coombe, MD ED   04/18/2019 0149 04/19/2019 1628 DNR [id number]  Ina Homes, MD ED   04/04/2019 0555 04/06/2019 1413 Full Code [id number]  Lars Mage, MD ED   03/27/2019 2130 03/29/2019 1900 Full Code [id number]  Mosetta Anis, MD ED   03/04/2019 1600 03/09/2019 1222 Full Code [id number]  Ledell Noss, MD ED   02/13/2018 0439 02/14/2018 1333 Full Code [id number]  Ezequiel Essex, MD ED   12/26/2016 0547 12/29/2016 2021 Full Code [id number]  Clapacs, Madie Reno, MD Inpatient   11/27/2016 1541 11/28/2016 1319 Full Code [id number]  Dalia Heading, PA-C ED   10/29/2016 1415 10/31/2016 1150 Full Code [id number]  Davonna Belling, MD ED   09/06/2016 2230 09/07/2016 1350 Full Code [id number]  Orlie Dakin, MD ED   09/06/2016 0934 09/06/2016 1529 Full Code [id number]  Macarthur Critchley, MD ED   09/02/2016 0045 09/04/2016 2127 Full Code [id number]  Junius Creamer, NP ED   07/30/2016 1357 08/06/2016 2031 Full Code [id number]  Nanci Pina, [place] Inpatient   07/30/2016 0143 07/30/2016 1153 Full Code [id number]  Orpah Greek, MD ED   05/30/2016 1622 06/05/2016 1454 Full Code [id number]  Niel Hummer, NP Inpatient   05/29/2016 1635 05/30/2016 1622 Full Code [id number]  Deno Etienne, DO ED   12/02/2011 0245 12/12/2011 0720 Full Code [id number]  Carlisle Cater, [place] ED         IV Access:   Peripheral  IV   Procedures and diagnostic studies:   No results found.   Medical Consultants:   None.   Subjective:    [name] no complaints.  Objective:    Vitals:   02/23/23 0748 02/23/23 1428 02/23/23 2239 02/24/23 0821  BP: 115/73 111/73  (!) 162/88  Pulse: 77 86  80  Resp:      Temp: 98.4 F (36.9 C) 98.4 F (36.9 C)  97.7 F (36.5 C)  TempSrc: Oral Oral  Oral  SpO2: 98% 100% 95% 97%  Weight:      Height:       SpO2: 97 % O2 Flow Rate (L/min): 2 L/min  No intake or output data in the 24 hours ending 02/24/23 0937  Filed Weights   02/21/23 1008 02/22/23 0256 02/23/23 0500  Weight: 87.1 kg 87.1 kg 87.2 kg    Exam: General exam: In no acute distress. Respiratory system: Good  air movement and clear to auscultation. Cardiovascular system: S1 & S2 heard, RRR. No JVD. Gastrointestinal system: Abdomen is nondistended, soft and nontender.  Skin: No rashes, lesions or ulcers Psychiatry: Judgement and insight appear normal. Mood & affect appropriate.    Data Reviewed:    Labs: Basic Metabolic Panel: Recent Labs  Lab 02/18/23 0304  NA 139  K 3.8  CL 101  CO2 25  GLUCOSE 100*  BUN 14  CREATININE 1.04  CALCIUM 9.5    GFR Estimated Creatinine Clearance: 88.8 mL/min (by C-G formula based on SCr of 1.04 mg/dL). Liver Function Tests: No results for input(s): "AST", "ALT", "ALKPHOS", "BILITOT", "PROT", "ALBUMIN" in the last 168 hours. No results for input(s): "LIPASE", "AMYLASE" in the last 168 hours. No results for input(s): "AMMONIA" in the last 168 hours. Coagulation profile No results for input(s): "INR", "PROTIME" in the last 168 hours. COVID-19 Labs  No results for input(s): "DDIMER", "FERRITIN", "LDH", "CRP" in the last 72 hours.  Lab Results  Component Value Date   SARSCOV2NAA NEGATIVE 11/23/2022   SARSCOV2NAA NEGATIVE 11/03/2022   [place] NEGATIVE 07/12/2022   [place] NEGATIVE 04/03/2022    CBC: No results for input(s):  "WBC", "NEUTROABS", "HGB", "HCT", "MCV", "PLT" in the last 168 hours. Cardiac Enzymes: No results for input(s): "CKTOTAL", "CKMB", "CKMBINDEX", "TROPONINI" in the last 168 hours. BNP (last 3 results) No results for input(s): "PROBNP" in the last 8760 hours. CBG: No results for input(s): "GLUCAP" in the last 168 hours. D-Dimer: No results for input(s): "DDIMER" in the last 72 hours. Hgb A1c: No results for input(s): "HGBA1C" in the last 72 hours. Lipid Profile: No results for input(s): "CHOL", "HDL", "LDLCALC", "TRIG", "CHOLHDL", "LDLDIRECT" in the last 72 hours. Thyroid function studies: No results for input(s): "TSH", "T4TOTAL", "T3FREE", "THYROIDAB" in the last 72 hours.  Invalid input(s): "FREET3" Anemia work up: No results for input(s): "VITAMINB12", "FOLATE", "FERRITIN", "TIBC", "IRON", "RETICCTPCT" in the last 72 hours. Sepsis Labs: No results for input(s): "PROCALCITON", "WBC", "LATICACIDVEN" in the last 168 hours. Microbiology No results found for this or any previous visit (from the past 240 hour(s)).   Medications:    amLODipine  10 mg Oral Daily   vitamin C  1,000 mg Oral Daily   carvedilol  3.125 mg Oral BID WC   collagenase   Topical Daily   furosemide  40 mg Oral Daily   gabapentin  900 mg Oral TID   isosorbide mononitrate  15 mg Oral Daily   magic mouthwash  10 mL Oral QID   melatonin  3 mg Oral QHS   mometasone-formoterol  2 puff Inhalation BID   morphine  30 mg Oral Q12H   multivitamin with minerals  1 tablet Oral Daily   pantoprazole  40 mg Oral Daily   thiamine  100 mg Oral Daily   Continuous Infusions:  magnesium sulfate bolus IVPB        LOS: 79 days   Charlynne Cousins  [facility]  02/24/2023, 9:37 AM

## 2023-02-24 NOTE — Progress Notes (Signed)
PT Cancellation Note  Patient Details Name: [name] MRN: [id number] DOB: Dec 15, 1965   Cancelled Treatment:    Reason Eval/Treat Not Completed: Patient declined, no reason specified  Needs assessment form was provided by SW. Patient initially agreeable to participate however becomes agitated with therapist while discussing needs and attempting to complete form. Patient refuses to participate further. State he will only comply if he receives a power wheelchair.  Candie Mile, PT, DPT Physical Therapist [facility] [place] 02/24/2023, 2:13 PM

## 2023-02-24 NOTE — Plan of Care (Signed)
  Problem: Fluid Volume: Goal: Hemodynamic stability will improve Outcome: Progressing   Problem: Activity: Goal: Risk for activity intolerance will decrease Outcome: Progressing   Problem: Nutrition: Goal: Adequate nutrition will be maintained Outcome: Progressing

## 2023-02-24 NOTE — Progress Notes (Addendum)
2pm: CSW spoke with Rolla Plate, PT who states patient refused to sign paperwork for the needs assessment due to his desire for a motorized wheelchair.  CSW has informed patient several times that he will not receive a motorized wheelchair while hospitalized.  10:30am: CSW spoke with Rolla Plate, PT about the request for a wheelchair needs assessment to be completed. Rolla Plate was agreeable and CSW sent forms via email for completion.  Once assessment is complete, CSW will send forms to [place] [facility] for review.  Madilyn Fireman, MSW, [name] Transitions of Care  Clinical Social Worker II [phone]

## 2023-02-25 DIAGNOSIS — I96 Gangrene, not elsewhere classified: Secondary | ICD-10-CM | POA: Diagnosis not present

## 2023-02-25 DIAGNOSIS — F250 Schizoaffective disorder, bipolar type: Secondary | ICD-10-CM | POA: Diagnosis not present

## 2023-02-25 MED ORDER — ENOXAPARIN SODIUM 40 MG/0.4ML IJ SOSY
40.0000 mg | PREFILLED_SYRINGE | INTRAMUSCULAR | Status: DC
  Administered 2023-02-25 – 2023-04-06 (×18): 40 mg via SUBCUTANEOUS
  Filled 2023-02-25 (×41): qty 0.4

## 2023-02-25 NOTE — Progress Notes (Signed)
PROGRESS NOTE    [name]  [id number] DOB: September 29, 1966 DOA: 12/07/2022 PCP: Marliss Coots, NP   Brief Narrative: Mr. Deloza is a 57 y.o. male with PMH chronic combined CHF, CAD, HLD, polyneuropathy, bipolar d/o, COPD, homelessness. Patient presented with left lower extremity chronic venous stasis wound with concern for superimposed infection.  He had been followed outpatient by podiatry as well prior to hospitalization. He was evaluated by orthopedic surgery and was recommended for undergoing left BKA.   Assessment and Plan:  Sepsis Blood cultures with no growth. Secondary to gangrene of left foot. Patient managed on empiric antibiotics and ultimately underwent left BKA.   Gangrene of left foot Patient managed empirically with Vancomycin, Cefepime and Flagyl. Orthopedic surgery consulted and patient underwent left BKA on 1/10.  Chronic combined systolic and diastolic heart failure Euvolemic. -Continue Lasix, Coreg  Primary hypertension -Continue Coreg, amlodipine, Imdur  COPD Stable. -Continue Dulera BID and albuterol PRN  Anxiety -Continue Atarax PRN  GERD -Continue Protonix  Constipation -MiraLAX , Dulcolax PRN  Chronic pain -Continue MS Contin 30 mg BID  Schizoaffective disorder Patient was managed on Abilify and Lexapro as an outpatient. Patient requesting to be restarted on antipsychotic medications because of thoughts, but declines to expand on thought characteristics.  -Psychiatry consult in AM   DVT prophylaxis: Lovenox Code Status:   Code Status: Full Code Family Communication: Two friends at bedside Disposition Plan: Discharge pending continued TOC recommendations. Medically stable for discharge   Consultants:  Orthopedic surgery  Procedures:    Antimicrobials: Vancomycin Cefepime Flagyl    Subjective: Patient reports having some shortness of breath intermittently, but none at this time.  Objective: BP (!) 119/90 (BP  Location: Left Arm)   Pulse 85   Temp 98.1 F (36.7 C)   Resp 17   Ht 5\' 9"  (1.753 m)   Wt 87.2 kg   SpO2 97%   BMI 28.39 kg/m   Examination:  General exam: Appears calm and comfortable Respiratory system: Clear to auscultation. Respiratory effort normal. Cardiovascular system: S1 & S2 heard, RRR. No murmurs, rubs, gallops or clicks. Gastrointestinal system: Abdomen is nondistended, soft and nontender. No organomegaly or masses felt. Normal bowel sounds heard. Central nervous system: Alert and oriented. No focal neurological deficits. Musculoskeletal: Left BKA, right AKA Skin: No cyanosis. No rashes  Data Reviewed: I have personally reviewed following labs and imaging studies  CBC Lab Results  Component Value Date   WBC 6.1 01/28/2023   RBC 4.55 01/28/2023   HGB 14.0 01/28/2023   HCT 40.5 01/28/2023   MCV 89.0 01/28/2023   MCH 30.8 01/28/2023   PLT 229 01/28/2023   MCHC 34.6 01/28/2023   RDW 14.6 01/28/2023   LYMPHSABS 2.1 12/27/2022   MONOABS 0.7 12/27/2022   EOSABS 0.1 12/27/2022   BASOSABS 0.1 [id number]     Last metabolic panel Lab Results  Component Value Date   NA 139 02/18/2023   K 3.8 02/18/2023   CL 101 02/18/2023   CO2 25 02/18/2023   BUN 14 02/18/2023   CREATININE 1.04 02/18/2023   GLUCOSE 100 (H) 02/18/2023   GFRNONAA >60 02/18/2023   GFRAA >60 08/21/2020   CALCIUM 9.5 02/18/2023   PHOS 4.7 (H) 07/18/2022   PROT 7.0 12/16/2022   ALBUMIN 3.0 (L) 12/16/2022   BILITOT 0.5 12/16/2022   ALKPHOS 57 12/16/2022   AST 12 (L) 12/16/2022   ALT 12 12/16/2022   ANIONGAP 13 02/18/2023    GFR: Estimated Creatinine Clearance: 88.8 mL/min (  by C-G formula based on SCr of 1.04 mg/dL).  No results found for this or any previous visit (from the past 240 hour(s)).    Radiology Studies: No results found.    LOS: 80 days    Cordelia Poche, MD [facility] 02/25/2023, 4:31 PM   If 7PM-7AM, please contact night-coverage [email or web address]

## 2023-02-25 NOTE — Progress Notes (Signed)
Pt has been informed NOT to roam around outside the unit without the presence of staff. And rules and regulations should be followed strictly within the facility.Pt/vistiors agreed with no complaints.

## 2023-02-26 DIAGNOSIS — F1410 Cocaine abuse, uncomplicated: Secondary | ICD-10-CM

## 2023-02-26 DIAGNOSIS — F1010 Alcohol abuse, uncomplicated: Secondary | ICD-10-CM

## 2023-02-26 DIAGNOSIS — F250 Schizoaffective disorder, bipolar type: Secondary | ICD-10-CM | POA: Diagnosis not present

## 2023-02-26 DIAGNOSIS — I96 Gangrene, not elsewhere classified: Secondary | ICD-10-CM | POA: Diagnosis not present

## 2023-02-26 DIAGNOSIS — F17200 Nicotine dependence, unspecified, uncomplicated: Secondary | ICD-10-CM

## 2023-02-26 MED ORDER — BIOTENE DRY MOUTH MT LIQD: 15.0000 mL | OROMUCOSAL | Status: DC | PRN

## 2023-02-26 NOTE — Progress Notes (Signed)
CSW received notification from [place] at [facility] stating the facility cannot accept patient.  Madilyn Fireman, MSW, [name] Transitions of Care  Clinical Social Worker II [phone]

## 2023-02-26 NOTE — Progress Notes (Signed)
PROGRESS NOTE    [name]  [id number] DOB: 12-16-1965 DOA: 12/07/2022 PCP: [name] Coots, NP   Brief Narrative: [name] is a 57 y.o. male with PMH chronic combined CHF, CAD, HLD, polyneuropathy, bipolar d/o, COPD, homelessness. Patient presented with left lower extremity chronic venous stasis wound with concern for superimposed infection.  He had been followed outpatient by podiatry as well prior to hospitalization. He was evaluated by orthopedic surgery and was recommended for undergoing left BKA.   Assessment and Plan:  Sepsis Blood cultures with no growth. Secondary to gangrene of left foot. Patient managed on empiric antibiotics and ultimately underwent left BKA.   Gangrene of left foot Patient managed empirically with Vancomycin, Cefepime and Flagyl. Orthopedic surgery consulted and patient underwent left BKA on 1/10.  Chronic combined systolic and diastolic heart failure Euvolemic. -Continue Lasix, Coreg  Primary hypertension -Continue Coreg, amlodipine, Imdur  COPD Stable. -Continue Dulera BID and albuterol PRN  Anxiety -Continue Atarax PRN  GERD -Continue Protonix  Constipation -MiraLAX , Dulcolax PRN  Chronic pain -Continue MS Contin 30 mg BID  Schizoaffective disorder Patient was managed on Abilify and Lexapro as an outpatient. Outpatient medications not being provided while inpatient at this time. Patient requesting to be restarted on antipsychotic medications because of thoughts, but declines to expand on thought characteristics and is unsure of what medication he was taking. States he does not think it is Abilify. -Psychiatry consult   DVT prophylaxis: Lovenox Code Status:   Code Status: Full Code Family Communication: Two friends at bedside Disposition Plan: Discharge pending continued TOC recommendations. Medically stable for discharge   Consultants:  Orthopedic surgery  Procedures:     Antimicrobials: Vancomycin Cefepime Flagyl    Subjective: No issues overnight. Still wanting his psychiatry medications to be adjusted.  Objective: BP (!) 149/89 (BP Location: Left Arm)   Pulse 66   Temp 98.1 F (36.7 C) (Oral)   Resp 18   Ht 5\' 9"  (1.753 m)   Wt 87.2 kg   SpO2 95%   BMI 28.39 kg/m   Examination:  General exam: Appears calm and comfortable Respiratory system: Respiratory effort normal. Musculoskeletal: Right AKA and left BKA Psychiatry: Judgement and insight appear normal. Mood & affect appropriate.   Data Reviewed: I have personally reviewed following labs and imaging studies  CBC Lab Results  Component Value Date   WBC 6.1 01/28/2023   RBC 4.55 01/28/2023   HGB 14.0 01/28/2023   HCT 40.5 01/28/2023   MCV 89.0 01/28/2023   MCH 30.8 01/28/2023   PLT 229 01/28/2023   MCHC 34.6 01/28/2023   RDW 14.6 01/28/2023   LYMPHSABS 2.1 12/27/2022   MONOABS 0.7 12/27/2022   EOSABS 0.1 12/27/2022   BASOSABS 0.1 [id number]     Last metabolic panel Lab Results  Component Value Date   NA 139 02/18/2023   K 3.8 02/18/2023   CL 101 02/18/2023   CO2 25 02/18/2023   BUN 14 02/18/2023   CREATININE 1.04 02/18/2023   GLUCOSE 100 (H) 02/18/2023   GFRNONAA >60 02/18/2023   GFRAA >60 08/21/2020   CALCIUM 9.5 02/18/2023   PHOS 4.7 (H) 07/18/2022   PROT 7.0 12/16/2022   ALBUMIN 3.0 (L) 12/16/2022   BILITOT 0.5 12/16/2022   ALKPHOS 57 12/16/2022   AST 12 (L) 12/16/2022   ALT 12 12/16/2022   ANIONGAP 13 02/18/2023    GFR: Estimated Creatinine Clearance: 88.8 mL/min (by C-G formula based on SCr of 1.04 mg/dL).  No results found  for this or any previous visit (from the past 240 hour(s)).    Radiology Studies: No results found.    LOS: 81 days    Cordelia Poche, MD [facility] 02/26/2023, 1:17 PM   If 7PM-7AM, please contact night-coverage [email or web address]

## 2023-02-26 NOTE — Consult Note (Signed)
[facility] Face-to-Face Psychiatry Consult   Reason for Consult:  Abnormal thoughts Referring Physician:  Dr. 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 The direct patient care time included face-to-face time with the patient, reviewing the patient's chart, communicating with other professionals, and coordinating care. Greater than 50% of this time was spent in counseling or coordinating care with the patient regarding goals of hospitalization, psycho-education, and discharge planning needs.  Subjective: "I I had a stroke, and have been in the hospital so long, I cannot really remember anything."  Psychiatry was consulted for medication recommendations for schizoaffective disorder.  HPI: [name] is a 57 y.o. male with medical history significant of asthma/COPD, bipolar 1 disorder, depression, schizoaffective disorder, homelessness, GERD, hypertension, QT prolongation, type 2 diabetes, alcohol abuse, cocaine and cannabis abuse, tobacco use, chronic combined systolic and diastolic heart failure, bilateral leg amputee.  Today, patient states that he is unable to continue on Seroquel because of his congestive heart failure.  Patient cannot recall past psychotropic  medications, but specifically denies any history of receiving long-acting injectable antipsychotics.  He believes that he was followed by an ACT team when he lived in [place], [place] but states he cannot be certain.  I attempted to review patient's past medications, which he states "I just cannot remember".  Patient requests to defer a longer assessment.  He does specifically deny any SI, HI, AVH.  He relates his disordered thoughts may be related to his past stroke.  He is agreeable to medications as long as they will not worsen his underlying medical conditions.  Patient is able to contract for safety while in the hospital.  During evaluation [name] is resting in bed in no acute distress.  He is alert/oriented, but uncertain to date which he states is due to his length of stay in the hospital.;  He is mildly irritable but cooperative; and mood congruent with affect. He is speaking in a clear tone at moderate volume, and normal pace; with minimal eye contact. his thought process is coherent and relevant; There is no indication that he is currently responding to internal/external stimuli or experiencing delusional thought content; and he has denied psychosis, and paranoia. Patient has remained calm throughout assessment and has answered questions appropriately.    Past Psychiatric History:   Risk to Self:  Denies Risk to Others:  Denies Prior Inpatient Therapy:  Yes, last inpatient admission with [facility] was 07/2020.  Medications at this time included Abilify 2 mg, Depakote 500 mg twice daily, gabapentin 600 mg 3 times daily in addition to his medications for underlying medical conditions. Prior Outpatient Therapy:  None current  Past Medical History:  Past Medical History:  Diagnosis Date   Asthma    Bipolar 1 disorder (  HCC)    CHF (congestive heart failure) (HCC)    COPD (chronic obstructive pulmonary disease) ([place])    Depression    Diabetes mellitus without  complication (HCC)    GERD (gastroesophageal reflux disease)    Homelessness    Hypertension    QT prolongation    Schizoaffective disorder [facility])     Past Surgical History:  Procedure Laterality Date   AMPUTATION Left 12/10/2022   Procedure: LEFT BELOW KNEE AMPUTATION;  Surgeon: Newt Minion, MD;  Location: [place];  Service: Orthopedics;  Laterality: Left;   AMPUTATION TOE Bilateral 01/02/2021   Procedure: AMPUTATION TOE;  Surgeon: Evelina Bucy, DPM;  Location: [place];  Service: Podiatry;  Laterality: Bilateral;  Ok to leave patient on hospital bed   IR ANGIO INTRA EXTRACRAN SEL COM CAROTID INNOMINATE BILAT MOD SED  12/17/2020   IR ANGIO VERTEBRAL SEL VERTEBRAL BILAT MOD SED  12/17/2020   RADIOLOGY WITH ANESTHESIA N/A 12/17/2020   Procedure: IR WITH ANESTHESIA;  Surgeon: Radiologist, Medication, MD;  Location: [place];  Service: Radiology;  Laterality: N/A;   Family History:  Family History  Problem Relation Age of Onset   Drug abuse Mother    Suicidality Mother    Family Psychiatric  History:  Social History:  Social History   Substance and Sexual Activity  Alcohol Use Yes     Social History   Substance and Sexual Activity  Drug Use Yes   Types: Cocaine    Social History   Socioeconomic History   Marital status: Single    Spouse name: Not on file   Number of children: 4   Years of education: Not on file   Highest education level: Not on file  Occupational History   Occupation: N/A  Tobacco Use   Smoking status: Some Days    Years: 30    Types: Cigarettes   Smokeless tobacco: Never  Vaping Use   Vaping Use: Never used  Substance and Sexual Activity   Alcohol use: Yes   Drug use: Yes    Types: Cocaine   Sexual activity: Not Currently    Partners: Female    Birth control/protection: None  Other Topics Concern   Not on file  Social History Narrative   ** Merged History Encounter **       Social Determinants of Health   Financial Resource Strain: Not on  file  Food Insecurity: Food Insecurity Present (11/24/2022)   Hunger Vital Sign    Worried About Running Out of Food in the Last Year: Often true    Ran Out of Food in the Last Year: Often true  Transportation Needs: Unmet Transportation Needs (12/07/2022)   PRAPARE - Hydrologist (Medical): No    Lack of Transportation (Non-Medical): Yes  Physical Activity: Not on file  Stress: Not on file  Social Connections: Not on file   Additional Social History:    Allergies:   Allergies  Allergen Reactions   Motrin [Ibuprofen] Anaphylaxis   Tylenol [Acetaminophen] Hives, Nausea And Vomiting and Rash   Lyrica Cr [Pregabalin Er] Other (See Comments)    Dystonia Dysarthria    Peanut-Containing Drug Products Other (See Comments)    Pt denies allergy to peanut butter 03/02/2021    Labs:  No results found for this or any previous visit (from the past 48 hour(s)).   Current Facility-Administered Medications  Medication Dose Route Frequency Provider Last Rate Last Admin   albuterol (PROVENTIL) (2.5 MG/3ML)  0.083% nebulizer solution 2.5 mg  2.5 mg Inhalation Q4H PRN [name], MD       alum & mag hydroxide-simeth (MAALOX/MYLANTA) 200-200-20 MG/5ML suspension 15-30 mL  15-30 mL Oral Q2H PRN Newt Minion, MD       amLODipine (NORVASC) tablet 10 mg  10 mg Oral Daily Newt Minion, MD   10 mg at 02/26/23 1030   ascorbic acid (VITAMIN C) tablet 1,000 mg  1,000 mg Oral Daily Newt Minion, MD   1,000 mg at 02/26/23 1031   benzocaine (ORAJEL) 10 % mucosal gel   Mouth/Throat QID PRN Geradine Girt, DO   Given at 01/25/23 1455   bisacodyl (DULCOLAX) EC tablet 5 mg  5 mg Oral Daily PRN Newt Minion, MD       carvedilol (COREG) tablet 3.125 mg  3.125 mg Oral BID WC Charlynne Cousins, MD   3.125 mg at 02/26/23 1030   collagenase (SANTYL) ointment   Topical Daily Donne [place], MD   Given at 02/15/23 1049   diphenhydrAMINE (BENADRYL) capsule 25 mg  25 mg Oral Q6H PRN  Caren Griffins, MD   25 mg at 01/28/23 2239   enoxaparin (LOVENOX) injection 40 mg  40 mg Subcutaneous Q24H Mariel Aloe, MD   40 mg at 02/25/23 2156   furosemide (LASIX) tablet 40 mg  40 mg Oral Daily Donne Hazel, MD   40 mg at 02/26/23 1031   gabapentin (NEURONTIN) capsule 900 mg  900 mg Oral TID Newt Minion, MD   900 mg at 02/26/23 1030   guaiFENesin-dextromethorphan (ROBITUSSIN DM) 100-10 MG/5ML syrup 15 mL  15 mL Oral Q4H PRN Newt Minion, MD       hydrALAZINE (APRESOLINE) tablet 25 mg  25 mg Oral Q6H PRN [name], MD       hydrOXYzine (ATARAX) tablet 25 mg  25 mg Oral TID PRN Hosie Poisson, MD   25 mg at 01/14/23 0521   isosorbide mononitrate (IMDUR) 24 hr tablet 15 mg  15 mg Oral Daily Newt Minion, MD   15 mg at 02/26/23 1037   labetalol (NORMODYNE) injection 10 mg  10 mg Intravenous Q10 min PRN Newt Minion, MD       lidocaine (XYLOCAINE) 2 % viscous mouth solution 15 mL  15 mL Mouth/Throat Q3H PRN Charlynne Cousins, MD   15 mL at 02/22/23 1515   magic mouthwash  10 mL Oral QID Charlynne Cousins, MD   10 mL at 02/25/23 2153   magnesium citrate solution 1 Bottle  1 Bottle Oral Once PRN Newt Minion, MD       magnesium sulfate IVPB 2 g 50 mL  2 g Intravenous Daily PRN Newt Minion, MD       melatonin tablet 3 mg  3 mg Oral QHS Vann, Jessica U, DO   3 mg at 02/12/23 2124   mometasone-formoterol (DULERA) 100-5 MCG/ACT inhaler 2 puff  2 puff Inhalation BID Caren Griffins, MD   2 puff at 02/26/23 0847   morphine (MS CONTIN) 12 hr tablet 30 mg  30 mg Oral Q12H Caren Griffins, MD   30 mg at 02/26/23 1029   multivitamin with minerals tablet 1 tablet  1 tablet Oral Daily Newt Minion, MD   1 tablet at 02/26/23 1030   ondansetron (ZOFRAN) injection 4 mg  4 mg Intravenous Q6H PRN Newt Minion, MD  Oral care mouth rinse  15 mL Mouth Rinse PRN Caren Griffins, MD       oxyCODONE (Oxy IR/ROXICODONE) immediate release tablet 20 mg  20 mg Oral Q4H PRN  Dwyane Dee, MD   20 mg at 02/26/23 1401   pantoprazole (PROTONIX) EC tablet 40 mg  40 mg Oral Daily Newt Minion, MD   40 mg at 02/21/23 1010   phenol (CHLORASEPTIC) mouth spray 1 spray  1 spray Mouth/Throat PRN Newt Minion, MD   1 spray at 02/22/23 0612   polyethylene glycol (MIRALAX / GLYCOLAX) packet 17 g  17 g Oral Daily PRN [name], MD       thiamine (VITAMIN B1) tablet 100 mg  100 mg Oral Daily Newt Minion, MD   100 mg at 02/24/23 0948   traMADol (ULTRAM) tablet 50 mg  50 mg Oral Q12H PRN Hosie Poisson, MD   50 mg at 02/22/23 2110    Musculoskeletal:   Psychiatric Specialty Exam:  Presentation  General Appearance:  Appropriate for Environment  Eye Contact: Minimal  Speech: Clear and Coherent  Speech Volume: Normal  Handedness: Right   Mood and Affect  Mood: Dysphoric; Irritable  Affect: Congruent   Thought Process  Thought Processes: Coherent  Descriptions of Associations:Intact  Orientation:Partial (Uncertain of date)  Thought Content:Logical  History of Schizophrenia/Schizoaffective disorder:Yes  Duration of Psychotic Symptoms:Greater than six months  Hallucinations:Hallucinations: None  Ideas of Reference:None  Suicidal Thoughts:Suicidal Thoughts: No  Homicidal Thoughts:Homicidal Thoughts: No   Sensorium  Memory: Immediate Fair; Recent Poor; Remote Poor  Judgment: Fair  Insight: Lacking   Executive Functions  Concentration: Fair  Attention Span: Poor  Recall: Poor  Fund of Knowledge: Fair  Language: Good   Psychomotor Activity  Psychomotor Activity:Psychomotor Activity: Restlessness   Assets  Assets: Desire for Improvement; Resilience   Sleep  Sleep:Sleep: Fair   Physical Exam: Physical Exam Vitals and nursing note reviewed.  Neurological:     General: No focal deficit present.     Mental Status: He is alert.    Review of Systems  Psychiatric/Behavioral:  Negative for depression,  hallucinations, substance abuse and suicidal ideas. The patient is not nervous/anxious and does not have insomnia.   All other systems reviewed and are negative.  Blood pressure 126/89, pulse 65, temperature 98.1 F (36.7 C), temperature source Oral, resp. rate 18, height 5\' 9"  (1.753 m), weight 87.2 kg, SpO2 97 %. Body mass index is 28.39 kg/m.  Treatment Plan Summary: Daily contact with patient to assess and evaluate symptoms and progress in treatment and Medication management Kalet Vanwye is a 57 y.o. male with a complicated medical history as above, and a psychiatric history of schizoaffective disorder.  At this time, his presentation is depressed without psychotic features. I would recommend consideration for restarting Lexapro 5 mg daily for major depressive disorder and.  Restart Abilify 5 mg daily for schizoaffective disorder mood stabilization and to augment antidepressant after assurance of normal Qtc.  Last BMP on 02/18/2023 was within normal limits.   Order for ECG placed.  Disposition: No evidence of imminent risk to self or others at present.   Patient does not meet criteria for psychiatric inpatient admission. Supportive therapy provided about ongoing stressors.  Patient will be reevaluated by psychiatry to assess for medication effectiveness and/or side effects.   Lavella Hammock, MD 02/26/2023 3:21 PM

## 2023-02-26 NOTE — Plan of Care (Signed)
  Problem: Fluid Volume: Goal: Hemodynamic stability will improve Outcome: Progressing   Problem: Activity: Goal: Risk for activity intolerance will decrease Outcome: Progressing   Problem: Nutrition: Goal: Adequate nutrition will be maintained Outcome: Progressing   Problem: Coping: Goal: Level of anxiety will decrease Outcome: Progressing   Problem: Elimination: Goal: Will not experience complications related to bowel motility Outcome: Progressing Goal: Will not experience complications related to urinary retention Outcome: Progressing   Problem: Pain Managment: Goal: General experience of comfort will improve Outcome: Progressing   Problem: Safety: Goal: Ability to remain free from injury will improve Outcome: Progressing   Problem: Skin Integrity: Goal: Risk for impaired skin integrity will decrease Outcome: Progressing   Problem: Education: Goal: Knowledge of General Education information will improve Description: Including pain rating scale, medication(s)/side effects and non-pharmacologic comfort measures Outcome: Progressing   Problem: Health Behavior/Discharge Planning: Goal: Ability to manage health-related needs will improve Outcome: Progressing

## 2023-02-27 DIAGNOSIS — F1010 Alcohol abuse, uncomplicated: Secondary | ICD-10-CM | POA: Diagnosis not present

## 2023-02-27 DIAGNOSIS — F17200 Nicotine dependence, unspecified, uncomplicated: Secondary | ICD-10-CM | POA: Diagnosis not present

## 2023-02-27 DIAGNOSIS — F250 Schizoaffective disorder, bipolar type: Secondary | ICD-10-CM | POA: Diagnosis not present

## 2023-02-27 DIAGNOSIS — I96 Gangrene, not elsewhere classified: Secondary | ICD-10-CM | POA: Diagnosis not present

## 2023-02-27 DIAGNOSIS — F1410 Cocaine abuse, uncomplicated: Secondary | ICD-10-CM | POA: Diagnosis not present

## 2023-02-27 NOTE — Progress Notes (Signed)
PROGRESS NOTE    [name]  [id number] DOB: February 04, 1966 DOA: 12/07/2022 PCP: [name] Coots, NP   Brief Narrative: [name] is a 57 y.o. male with PMH chronic combined CHF, CAD, HLD, polyneuropathy, bipolar d/o, COPD, homelessness. Patient presented with left lower extremity chronic venous stasis wound with concern for superimposed infection.  He had been followed outpatient by podiatry as well prior to hospitalization. He was evaluated by orthopedic surgery and was recommended for undergoing left BKA.   Assessment and Plan:  Sepsis Blood cultures with no growth. Secondary to gangrene of left foot. Patient managed on empiric antibiotics and ultimately underwent left BKA.   Gangrene of left foot Patient managed empirically with Vancomycin, Cefepime and Flagyl. Orthopedic surgery consulted and patient underwent left BKA on 1/10.  Chronic combined systolic and diastolic heart failure Euvolemic. -Continue Lasix, Coreg  Primary hypertension -Continue Coreg, amlodipine, Imdur  COPD Stable. -Continue Dulera BID and albuterol PRN  Anxiety -Continue Atarax PRN  GERD -Continue Protonix  Constipation -MiraLAX , Dulcolax PRN  Chronic pain -Continue MS Contin 30 mg BID  Schizoaffective disorder Patient was managed on Abilify and Lexapro as an outpatient. Outpatient medications not being provided while inpatient at this time. Patient requesting to be restarted on antipsychotic medications because of thoughts, but declines to expand on thought characteristics and is unsure of what medication he was taking. States he does not think it is Abilify. -Psychiatry recommending Abilify and Lexapro, although patient's QTc is prolonged; will clarify recommendations   DVT prophylaxis: Lovenox Code Status:   Code Status: Full Code Family Communication: Two friends at bedside Disposition Plan: Discharge pending continued TOC recommendations. Medically stable for  discharge   Consultants:  Orthopedic surgery  Procedures:    Antimicrobials: Vancomycin Cefepime Flagyl    Subjective: No issues noted overnight.  Objective: BP (!) 143/94 (BP Location: Left Arm)   Pulse 70   Temp 98.2 F (36.8 C) (Oral)   Resp 18   Ht 5\' 9"  (1.753 m)   Wt 87.1 kg   SpO2 100%   BMI 28.36 kg/m   Examination:  General exam: Appears calm and comfortable    Data Reviewed: I have personally reviewed following labs and imaging studies  CBC Lab Results  Component Value Date   WBC 6.1 01/28/2023   RBC 4.55 01/28/2023   HGB 14.0 01/28/2023   HCT 40.5 01/28/2023   MCV 89.0 01/28/2023   MCH 30.8 01/28/2023   PLT 229 01/28/2023   MCHC 34.6 01/28/2023   RDW 14.6 01/28/2023   LYMPHSABS 2.1 12/27/2022   MONOABS 0.7 12/27/2022   EOSABS 0.1 12/27/2022   BASOSABS 0.1 [id number]     Last metabolic panel Lab Results  Component Value Date   NA 139 02/18/2023   K 3.8 02/18/2023   CL 101 02/18/2023   CO2 25 02/18/2023   BUN 14 02/18/2023   CREATININE 1.04 02/18/2023   GLUCOSE 100 (H) 02/18/2023   GFRNONAA >60 02/18/2023   GFRAA >60 08/21/2020   CALCIUM 9.5 02/18/2023   PHOS 4.7 (H) 07/18/2022   PROT 7.0 12/16/2022   ALBUMIN 3.0 (L) 12/16/2022   BILITOT 0.5 12/16/2022   ALKPHOS 57 12/16/2022   AST 12 (L) 12/16/2022   ALT 12 12/16/2022   ANIONGAP 13 02/18/2023    GFR: Estimated Creatinine Clearance: 88.8 mL/min (by C-G formula based on SCr of 1.04 mg/dL).  No results found for this or any previous visit (from the past 240 hour(s)).    Radiology Studies:  No results found.    LOS: 82 days    Cordelia Poche, MD [facility] 02/27/2023, 2:51 PM   If 7PM-7AM, please contact night-coverage [email or web address]

## 2023-02-27 NOTE — Plan of Care (Signed)
  Problem: Activity: Goal: Risk for activity intolerance will decrease Outcome: Progressing   Problem: Pain Managment: Goal: General experience of comfort will improve Outcome: Progressing   Problem: Education: Goal: Knowledge of General Education information will improve Description: Including pain rating scale, medication(s)/side effects and non-pharmacologic comfort measures Outcome: Progressing   Problem: Health Behavior/Discharge Planning: Goal: Ability to manage health-related needs will improve Outcome: Progressing

## 2023-02-27 NOTE — Consult Note (Signed)
Face-to-Face Psychiatry Consult   Reason for Consult:  Abnormal thoughts Referring Physician:  Dr. 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 The direct patient care time included face-to-face time with the patient, reviewing the patient's chart, communicating with other professionals, and coordinating care. Greater than 50% of this time was spent in counseling or coordinating care with the patient regarding goals of hospitalization, psycho-education, and discharge planning needs.  Subjective: "I am feeling much better today."  Psychiatry was consulted for medication recommendations for schizoaffective disorder.  HPI: [name] is a 57 y.o. male with medical history significant of asthma/COPD, bipolar 1 disorder, depression, schizoaffective disorder, homelessness, GERD, hypertension, QT prolongation, type 2 diabetes, alcohol abuse, cocaine and cannabis abuse, tobacco use, chronic combined systolic and diastolic heart failure, bilateral leg amputee.  Today, patient states that he is feeling better.  He asks about medication changes, and I discussed with him addition of low-dose Lexapro 5 mg for depression and Abilify 5 mg for depression augmentation and mood  stabilization.  Patient is agreeable to medication management and states that he believes he had already taken the medication today and feels much better.  Patient specifically denies any SI, HI, AVH.  He denies any "confused thoughts".  He continues to be able to contract for safety.  During evaluation Nevan Tewalt is resting in bed in no acute distress.  He is alert/oriented. He is calm and cooperative; and mood congruent with affect. He is speaking in a clear tone at moderate volume, and normal pace; with minimal eye contact. his thought process is coherent and relevant; There is no indication that he is currently responding to internal/external stimuli or experiencing delusional thought content; and he has denied psychosis, and paranoia. Patient has remained calm throughout assessment and has answered questions appropriately.    Of note, due to concern for prolongation of QTc with psychotropic medication.  Abilify and Lexapro have not been started. There is not a recent ECG.  Past Psychiatric History:   Risk to Self:  Denies Risk to Others:  Denies Prior Inpatient Therapy:  Yes, last inpatient admission with [facility] was 07/2020.    Past Medical History:  Past Medical History:  Diagnosis Date   Asthma    Bipolar 1 disorder (HCC)    CHF (congestive heart failure) (HCC)    COPD (chronic obstructive pulmonary disease) (HCC)    Depression    Diabetes mellitus without complication (HCC)    GERD (gastroesophageal reflux disease)    Homelessness    Hypertension    QT prolongation    Schizoaffective disorder ([place])     Past Surgical History:  Procedure Laterality Date   AMPUTATION Left 12/10/2022   Procedure: LEFT BELOW KNEE AMPUTATION;  Surgeon: Newt Minion, MD;  Location: [facility];  Service: Orthopedics;  Laterality: Left;   AMPUTATION TOE Bilateral 01/02/2021   Procedure: AMPUTATION TOE;  Surgeon: Evelina Bucy, DPM;  Location: [facility];  Service: Podiatry;   Laterality: Bilateral;  Ok to leave patient on hospital bed   IR ANGIO INTRA EXTRACRAN SEL COM CAROTID INNOMINATE BILAT MOD SED  12/17/2020   IR ANGIO VERTEBRAL SEL VERTEBRAL BILAT MOD SED  12/17/2020   RADIOLOGY WITH ANESTHESIA N/A 12/17/2020   Procedure: IR WITH ANESTHESIA;  Surgeon: Radiologist, Medication, MD;  Location: [facility];  Service: Radiology;  Laterality: N/A;   Family History:  Family History  Problem Relation Age of Onset   Drug abuse Mother    Suicidality Mother    Family Psychiatric  History:  Social History:  Social History   Substance and Sexual Activity  Alcohol Use Yes     Social History   Substance and Sexual Activity  Drug Use Yes   Types: Cocaine    Social History   Socioeconomic History   Marital status: Single    Spouse name: Not on file   Number of children: 4   Years of education: Not on file   Highest education level: Not on file  Occupational History   Occupation: N/A  Tobacco Use   Smoking status: Some Days    Years: 30    Types: Cigarettes   Smokeless tobacco: Never  Vaping Use   Vaping Use: Never used  Substance and Sexual Activity   Alcohol use: Yes   Drug use: Yes    Types: Cocaine   Sexual activity: Not Currently    Partners: Female    Birth control/protection: None  Other Topics Concern   Not on file  Social History Narrative   ** Merged History Encounter **       Social Determinants of Health   Financial Resource Strain: Not on file  Food Insecurity: Food Insecurity Present (11/24/2022)   Hunger Vital Sign    Worried About Running Out of Food in the Last Year: Often true    Ran Out of Food in the Last Year: Often true  Transportation Needs: Unmet Transportation Needs (12/07/2022)   PRAPARE - Hydrologist (Medical): No    Lack of Transportation (Non-Medical): Yes  Physical Activity: Not on file  Stress: Not on file  Social Connections: Not on file   Additional Social History:  Patient is  uncertain of discharge plans now that he is a double leg amputee and was previously homeless.  He states he was working with social work.  Allergies:   Allergies  Allergen Reactions   Motrin [Ibuprofen] Anaphylaxis   Tylenol [Acetaminophen] Hives, Nausea And Vomiting and Rash   Lyrica Cr [Pregabalin Er] Other (See Comments)    Dystonia Dysarthria    Peanut-Containing Drug Products Other (See Comments)    Pt denies allergy to peanut butter 03/02/2021    Labs:  No results found for this or any previous visit (from the past 48 hour(s)).   Current Facility-Administered Medications  Medication Dose Route Frequency Provider Last Rate Last Admin   albuterol (PROVENTIL) (2.5 MG/3ML) 0.083% nebulizer solution 2.5 mg  2.5 mg Inhalation Q4H PRN [name], MD       alum & mag hydroxide-simeth (MAALOX/MYLANTA) 200-200-20 MG/5ML suspension 15-30 mL  15-30 mL Oral Q2H PRN Newt Minion, MD       amLODipine (NORVASC) tablet 10 mg  10 mg Oral Daily Newt Minion,  MD   10 mg at 02/27/23 1019   ascorbic acid (VITAMIN C) tablet 1,000 mg  1,000 mg Oral Daily Newt Minion, MD   1,000 mg at 02/27/23 1019   benzocaine (ORAJEL) 10 % mucosal gel   Mouth/Throat QID PRN Geradine Girt, DO   Given at 01/25/23 1455   bisacodyl (DULCOLAX) EC tablet 5 mg  5 mg Oral Daily PRN Newt Minion, MD       carvedilol (COREG) tablet 3.125 mg  3.125 mg Oral BID WC Charlynne Cousins, MD   3.125 mg at 02/27/23 1803   collagenase (SANTYL) ointment   Topical Daily Donne Hazel, MD   Given at 02/15/23 1049   diphenhydrAMINE (BENADRYL) capsule 25 mg  25 mg Oral Q6H PRN Caren Griffins, MD   25 mg at 01/28/23 2239   enoxaparin (LOVENOX) injection 40 mg  40 mg Subcutaneous Q24H Mariel Aloe, MD   40 mg at 02/26/23 2146   furosemide (LASIX) tablet 40 mg  40 mg Oral Daily Donne Hazel, MD   40 mg at 02/27/23 1019   gabapentin (NEURONTIN) capsule 900 mg  900 mg Oral TID Newt Minion, MD   900 mg at 02/27/23 1415    guaiFENesin-dextromethorphan (ROBITUSSIN DM) 100-10 MG/5ML syrup 15 mL  15 mL Oral Q4H PRN Newt Minion, MD       hydrALAZINE (APRESOLINE) tablet 25 mg  25 mg Oral Q6H PRN [name], MD       hydrOXYzine (ATARAX) tablet 25 mg  25 mg Oral TID PRN Hosie Poisson, MD   25 mg at 01/14/23 0521   isosorbide mononitrate (IMDUR) 24 hr tablet 15 mg  15 mg Oral Daily Newt Minion, MD   15 mg at 02/27/23 1018   labetalol (NORMODYNE) injection 10 mg  10 mg Intravenous Q10 min PRN Newt Minion, MD       lidocaine (XYLOCAINE) 2 % viscous mouth solution 15 mL  15 mL Mouth/Throat Q3H PRN Charlynne Cousins, MD   15 mL at 02/22/23 1515   magic mouthwash  10 mL Oral QID Charlynne Cousins, MD   10 mL at 02/27/23 1804   magnesium citrate solution 1 Bottle  1 Bottle Oral Once PRN Newt Minion, MD       magnesium sulfate IVPB 2 g 50 mL  2 g Intravenous Daily PRN Newt Minion, MD       melatonin tablet 3 mg  3 mg Oral QHS Vann, Jessica U, DO   3 mg at 02/12/23 2124   mometasone-formoterol (DULERA) 100-5 MCG/ACT inhaler 2 puff  2 puff Inhalation BID Caren Griffins, MD   2 puff at 02/27/23 0834   morphine (MS CONTIN) 12 hr tablet 30 mg  30 mg Oral Q12H Caren Griffins, MD   30 mg at 02/27/23 1019   multivitamin with minerals tablet 1 tablet  1 tablet Oral Daily Newt Minion, MD   1 tablet at 02/27/23 1018   ondansetron (ZOFRAN) injection 4 mg  4 mg Intravenous Q6H PRN Newt Minion, MD       Oral care mouth rinse  15 mL Mouth Rinse PRN Caren Griffins, MD       oxyCODONE (Oxy IR/ROXICODONE) immediate release tablet 20 mg  20 mg Oral Q4H PRN Dwyane Dee, MD   20 mg at 02/27/23 0051   pantoprazole (PROTONIX) EC tablet 40 mg  40 mg Oral Daily  Newt Minion, MD   40 mg at 02/21/23 1010   phenol (CHLORASEPTIC) mouth spray 1 spray  1 spray Mouth/Throat PRN Newt Minion, MD   1 spray at 02/22/23 0612   polyethylene glycol (MIRALAX / GLYCOLAX) packet 17 g  17 g Oral Daily PRN [name], MD        thiamine (VITAMIN B1) tablet 100 mg  100 mg Oral Daily Newt Minion, MD   100 mg at 02/24/23 0948   traMADol (ULTRAM) tablet 50 mg  50 mg Oral Q12H PRN Hosie Poisson, MD   50 mg at 02/22/23 2110    Musculoskeletal:   Psychiatric Specialty Exam:  Presentation  General Appearance:  Appropriate for Environment  Eye Contact: Good  Speech: Clear and Coherent  Speech Volume: Normal  Handedness: Right   Mood and Affect  Mood: Euthymic  Affect: Appropriate   Thought Process  Thought Processes: Coherent  Descriptions of Associations:Intact  Orientation:Full (Time, Place and Person)  Thought Content:Logical  History of Schizophrenia/Schizoaffective disorder:Yes  Duration of Psychotic Symptoms:Greater than six months  Hallucinations:Hallucinations: None  Ideas of Reference:None  Suicidal Thoughts:Suicidal Thoughts: No  Homicidal Thoughts:Homicidal Thoughts: No   Sensorium  Memory: Immediate Good; Recent Fair; Remote Good  Judgment: Fair  Insight: Fair   Community education officer  Concentration: Good  Attention Span: Good  Recall: [place] of Knowledge: Good  Language: Good   Psychomotor Activity  Psychomotor Activity:Psychomotor Activity: Normal   Assets  Assets: Communication Skills; Desire for Improvement; Resilience   Sleep  Sleep:Sleep: Good   Physical Exam: Physical Exam Vitals and nursing note reviewed.  Cardiovascular:     Rate and Rhythm: Normal rate.  Pulmonary:     Effort: Pulmonary effort is normal. No respiratory distress.  Musculoskeletal:     Comments: In wheelchair; bilat amputee  Neurological:     General: No focal deficit present.     Mental Status: He is alert.    Review of Systems  Psychiatric/Behavioral:  Negative for depression, hallucinations, substance abuse and suicidal ideas. The patient is not nervous/anxious and does not have insomnia.   All other systems reviewed and are  negative.  Blood pressure 113/71, pulse 88, temperature 98.9 F (37.2 C), temperature source Oral, resp. rate 18, height 5\' 9"  (1.753 m), weight 87.1 kg, SpO2 95 %. Body mass index is 28.36 kg/m.  Treatment Plan Summary: Daily contact with patient to assess and evaluate symptoms and progress in treatment and Medication management Andrue Guignard is a 57 y.o. male with a complicated medical history as above, and a psychiatric history of schizoaffective disorder.  At this time, his presentation is depressed without psychotic features. Consideration for restarting Lexapro 5 mg daily for major depressive disorder and.  Restart Abilify 5 mg daily for schizoaffective disorder mood stabilization and to augment antidepressant after assurance of normal Qtc.  Last BMP on 02/18/2023 was within normal limits.   Order for ECG placed.  Of note, patient stated that he felt better today and assumed he had gotten the medication. He was pleasant and more conversant, and denied any complaints.  At this time, I suspect placebo effect. Recommend continuing to monitor for depression or psychosis, and if ECG is stable with normal QTc, medications can be reconsidered at that time. I would not start Lexapro or Abilify until patient is symptomatic and medically stabilized.   Disposition: No evidence of imminent risk to self or others at present.   Patient does not meet criteria for  psychiatric inpatient admission. Supportive therapy provided about ongoing stressors.  Psychiatry will sign off.  Please re-consult for any future acute psychiatric concerns.  She Lavella Hammock, MD 02/27/2023 7:23 PM

## 2023-02-28 DIAGNOSIS — I96 Gangrene, not elsewhere classified: Secondary | ICD-10-CM | POA: Diagnosis not present

## 2023-02-28 NOTE — Progress Notes (Signed)
PROGRESS NOTE    [name]  [id number] DOB: 1966/05/04 DOA: 12/07/2022 PCP: Marliss Coots, NP   Brief Narrative: [name] is a 57 y.o. male with PMH chronic combined CHF, CAD, HLD, polyneuropathy, bipolar d/o, COPD, homelessness. Patient presented with left lower extremity chronic venous stasis wound with concern for superimposed infection.  He had been followed outpatient by podiatry as well prior to hospitalization. He was evaluated by orthopedic surgery and was recommended for undergoing left BKA.   Assessment and Plan:  Sepsis Blood cultures with no growth. Secondary to gangrene of left foot. Patient managed on empiric antibiotics and ultimately underwent left BKA.   Gangrene of left foot Patient managed empirically with Vancomycin, Cefepime and Flagyl. Orthopedic surgery consulted and patient underwent left BKA on 1/10.  Chronic combined systolic and diastolic heart failure Euvolemic. -Continue Lasix, Coreg  Primary hypertension -Continue Coreg, amlodipine, Imdur  COPD Stable. -Continue Dulera BID and albuterol PRN  Anxiety -Continue Atarax PRN  GERD -Continue Protonix  Constipation -MiraLAX , Dulcolax PRN  Chronic pain -Continue MS Contin 30 mg BID  Schizoaffective disorder Patient was managed on Abilify and Lexapro as an outpatient. Outpatient medications not being provided while inpatient at this time. Patient requesting to be restarted on antipsychotic medications because of thoughts, but declines to expand on thought characteristics and is unsure of what medication he was taking. States he does not think it is Abilify. Psychiatry evaluated and patient's symptoms have improved after he believed he received medication. -Psychiatry recommending Abilify and Lexapro if symptoms return and/or QTc is improved   DVT prophylaxis: Lovenox Code Status:   Code Status: Full Code Family Communication: One friend at bedside Disposition Plan: Discharge  pending continued TOC recommendations. Medically stable for discharge   Consultants:  Orthopedic surgery  Procedures:    Antimicrobials: Vancomycin Cefepime Flagyl    Subjective: No concerns this morning.  Objective: BP (!) 147/85 (BP Location: Right Arm)   Pulse 65   Temp 98.4 F (36.9 C) (Oral)   Resp 18   Ht 5\' 9"  (1.753 m)   Wt 88.6 kg   SpO2 100%   BMI 28.84 kg/m   Examination:  General exam: Appears calm and comfortable   Data Reviewed: I have personally reviewed following labs and imaging studies  CBC Lab Results  Component Value Date   WBC 6.1 01/28/2023   RBC 4.55 01/28/2023   HGB 14.0 01/28/2023   HCT 40.5 01/28/2023   MCV 89.0 01/28/2023   MCH 30.8 01/28/2023   PLT 229 01/28/2023   MCHC 34.6 01/28/2023   RDW 14.6 01/28/2023   LYMPHSABS 2.1 12/27/2022   MONOABS 0.7 12/27/2022   EOSABS 0.1 12/27/2022   BASOSABS 0.1 [id number]     Last metabolic panel Lab Results  Component Value Date   NA 139 02/18/2023   K 3.8 02/18/2023   CL 101 02/18/2023   CO2 25 02/18/2023   BUN 14 02/18/2023   CREATININE 1.04 02/18/2023   GLUCOSE 100 (H) 02/18/2023   GFRNONAA >60 02/18/2023   GFRAA >60 08/21/2020   CALCIUM 9.5 02/18/2023   PHOS 4.7 (H) 07/18/2022   PROT 7.0 12/16/2022   ALBUMIN 3.0 (L) 12/16/2022   BILITOT 0.5 12/16/2022   ALKPHOS 57 12/16/2022   AST 12 (L) 12/16/2022   ALT 12 12/16/2022   ANIONGAP 13 02/18/2023    GFR: Estimated Creatinine Clearance: 89.5 mL/min (by C-G formula based on SCr of 1.04 mg/dL).  No results found for this or any previous  visit (from the past 240 hour(s)).    Radiology Studies: No results found.    LOS: 83 days    Cordelia Poche, MD [facility] 02/28/2023, 10:14 AM   If 7PM-7AM, please contact night-coverage [email or web address]

## 2023-03-01 DIAGNOSIS — A419 Sepsis, unspecified organism: Secondary | ICD-10-CM | POA: Diagnosis not present

## 2023-03-01 DIAGNOSIS — B370 Candidal stomatitis: Secondary | ICD-10-CM | POA: Diagnosis not present

## 2023-03-01 DIAGNOSIS — E1152 Type 2 diabetes mellitus with diabetic peripheral angiopathy with gangrene: Secondary | ICD-10-CM | POA: Diagnosis not present

## 2023-03-01 DIAGNOSIS — I10 Essential (primary) hypertension: Secondary | ICD-10-CM | POA: Diagnosis not present

## 2023-03-01 DIAGNOSIS — I96 Gangrene, not elsewhere classified: Secondary | ICD-10-CM | POA: Diagnosis not present

## 2023-03-01 DIAGNOSIS — I5043 Acute on chronic combined systolic (congestive) and diastolic (congestive) heart failure: Secondary | ICD-10-CM | POA: Diagnosis not present

## 2023-03-01 NOTE — Progress Notes (Signed)
PROGRESS NOTE    [name]  [id number] DOB: 06-05-1966 DOA: 12/07/2022 PCP: Marliss Coots, NP   Brief Narrative: [name] is a 57 y.o. male with PMH chronic combined CHF, CAD, HLD, polyneuropathy, bipolar d/o, COPD, homelessness. Patient presented with left lower extremity chronic venous stasis wound with concern for superimposed infection.  He had been followed outpatient by podiatry as well prior to hospitalization. He was evaluated by orthopedic surgery and was recommended for undergoing left BKA.   Assessment and Plan:  Sepsis Blood cultures with no growth. Secondary to gangrene of left foot. Patient managed on empiric antibiotics and ultimately underwent left BKA.   Gangrene of left foot Patient managed empirically with Vancomycin, Cefepime and Flagyl. Orthopedic surgery consulted and patient underwent left BKA on 1/10.  Chronic combined systolic and diastolic heart failure Euvolemic. -Continue Lasix, Coreg  Primary hypertension -Continue Coreg, amlodipine, Imdur  COPD Stable. -Continue Dulera BID and albuterol PRN  Anxiety -Continue Atarax PRN  GERD -Continue Protonix  Constipation -MiraLAX , Dulcolax PRN  Chronic pain -Continue MS Contin 30 mg BID  Schizoaffective disorder Patient was managed on Abilify and Lexapro as an outpatient. Outpatient medications not being provided while inpatient at this time. Patient requesting to be restarted on antipsychotic medications because of thoughts, but declines to expand on thought characteristics and is unsure of what medication he was taking. States he does not think it is Abilify. Psychiatry evaluated and patient's symptoms have improved after he believed he received medication. -Psychiatry recommending Abilify and Lexapro if symptoms return and/or QTc is improved   DVT prophylaxis: Lovenox Code Status:   Code Status: Full Code Family Communication: One friend at bedside Disposition Plan: Discharge  pending continued TOC recommendations. Medically stable for discharge.   Consultants:  Orthopedic surgery Psychiatry  Procedures:  1/10: Left BKA  Antimicrobials: Vancomycin Cefepime Flagyl    Subjective: No issues today.  Objective: BP 122/73 (BP Location: Left Arm)   Pulse 72   Temp 98.1 F (36.7 C)   Resp 17   Ht 5\' 9"  (1.753 m)   Wt 88.6 kg   SpO2 97%   BMI 28.84 kg/m   Examination:  General exam: Appears calm and comfortable Respiratory system: Respiratory effort normal. Central nervous system: Alert and oriented. Psychiatry: Judgement and insight appear normal.   Data Reviewed: I have personally reviewed following labs and imaging studies  CBC Lab Results  Component Value Date   WBC 6.1 01/28/2023   RBC 4.55 01/28/2023   HGB 14.0 01/28/2023   HCT 40.5 01/28/2023   MCV 89.0 01/28/2023   MCH 30.8 01/28/2023   PLT 229 01/28/2023   MCHC 34.6 01/28/2023   RDW 14.6 01/28/2023   LYMPHSABS 2.1 12/27/2022   MONOABS 0.7 12/27/2022   EOSABS 0.1 12/27/2022   BASOSABS 0.1 [id number]     Last metabolic panel Lab Results  Component Value Date   NA 139 02/18/2023   K 3.8 02/18/2023   CL 101 02/18/2023   CO2 25 02/18/2023   BUN 14 02/18/2023   CREATININE 1.04 02/18/2023   GLUCOSE 100 (H) 02/18/2023   GFRNONAA >60 02/18/2023   GFRAA >60 08/21/2020   CALCIUM 9.5 02/18/2023   PHOS 4.7 (H) 07/18/2022   PROT 7.0 12/16/2022   ALBUMIN 3.0 (L) 12/16/2022   BILITOT 0.5 12/16/2022   ALKPHOS 57 12/16/2022   AST 12 (L) 12/16/2022   ALT 12 12/16/2022   ANIONGAP 13 02/18/2023    GFR: Estimated Creatinine Clearance: 89.5 mL/min (by  C-G formula based on SCr of 1.04 mg/dL).  No results found for this or any previous visit (from the past 240 hour(s)).    Radiology Studies: No results found.    LOS: 84 days    Cordelia Poche, MD [facility] 03/01/2023, 1:36 PM   If 7PM-7AM, please contact night-coverage [email or web address]

## 2023-03-01 NOTE — Progress Notes (Signed)
EKG taken while pt. Is in wheel chair he refused to lay flat in bed

## 2023-03-02 DIAGNOSIS — I96 Gangrene, not elsewhere classified: Secondary | ICD-10-CM | POA: Diagnosis not present

## 2023-03-02 DIAGNOSIS — I10 Essential (primary) hypertension: Secondary | ICD-10-CM | POA: Diagnosis not present

## 2023-03-02 MED ORDER — ARIPIPRAZOLE 5 MG PO TABS
5.0000 mg | ORAL_TABLET | Freq: Every day | ORAL | Status: DC
  Administered 2023-03-02 – 2023-06-16 (×106): 5 mg via ORAL
  Filled 2023-03-02 (×107): qty 1

## 2023-03-02 MED ORDER — ESCITALOPRAM OXALATE 10 MG PO TABS
5.0000 mg | ORAL_TABLET | Freq: Every day | ORAL | Status: DC
  Administered 2023-03-02 – 2023-06-16 (×105): 5 mg via ORAL
  Filled 2023-03-02 (×107): qty 1

## 2023-03-02 NOTE — Progress Notes (Signed)
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 PMH includes asthma, bioplar 1, depression, schizoaffective disorder, CVA, chronic CHF, homelessness, HTN, DM2, cocaine and cannabis use, and R AKA.    PT Comments    Continuing work on functional mobility and activity tolerance;  Session focused on education, discerning pt's most important goals;   Lengthy discussion re: pt's desire for a power chair; discussed with pt that the rule is that if gets a power chair, insurance will not cover getting a prosthesis; Alexius clearly states that he wants to get a prosthesis ("I've been in this (wheel)chair for two years, and I want to get out of it"), and during our discussion, he indicated he would no longer pursue getting a power chair;   Keyvan also voiced understanding that he must go to a prosthesis evaluation at the Prosthetist's office as an outpatient visit, and be out of the hospital and follow up to get an appointment;   To prepare for getting a prosthesis, we discussed the importance of strengthening hip and knee extensors and core, and stretching knee and hip flexors; Reviewed exercises with [name], and demonstrated modifications to make exercises more comfortable and accessible for [facility] -- like keeping HOB elevated for bolstered bridging, and performing hip extension exercises in sidelying; will update exercises posted in pt's room to reflect this;   [place] for pre-prosthesis education; We have done a lot of education already (and will continue to do so); Worth having a prosthetist come speak to [place], check out residual limb healing, and look closely at Aijalon's R limb for prosthesis considerations -- it looks like a long AKA (perhaps patella is still present?), which can make getting a  prosthesis for the R limb more complicated;   Noted that Brunetta Genera is requesting that pt go to short-term rehab before being placed into the community in an independent living setting; at this point, Sumner has been here acutely and getting acute rehab services, and is at a point when he could manage at a modified independent level   Recommendations for follow up therapy are one component of a multi-disciplinary discharge planning process, led by the attending physician.  Recommendations may be updated based on patient status, additional functional criteria and insurance authorization.  Follow Up Recommendations  Can patient physically be transported by private vehicle: No (if car transfer is meaningful to pt, can work with sliding board on that)    Assistance Recommended at Discharge PRN  Patient can return home with the following A little help with walking and/or transfers;A little help with bathing/dressing/bathroom;Assistance with cooking/housework;Assist for transportation;Help with stairs or ramp for entrance   Equipment Recommendations  Other (comment) (consider a new wheelchair; Power chair not an option if pt wants to pursue getting a prosthesis)    Recommendations for Other Services Other (comment) (Prosthetist consult for preprosthetic ed.; is there anyway he could get consistent inpatient psychiatric counseling?)     Precautions / Restrictions Precautions Precautions: Fall Precaution Comments: R AKA, L BKA Restrictions RLE Weight Bearing: Non weight bearing LLE Weight Bearing: Non weight bearing           Cognition Arousal/Alertness: Awake/alert Behavior During Therapy: WFL for tasks assessed/performed Overall Cognitive Status: No family/caregiver present to determine baseline cognitive functioning  Exercises  This PT demonstrated pre-prosthesis exercises in sidelying    General Comments General comments (skin  integrity, edema, etc.): Discussed issues with wc, and considerations for pursueing powr wc versus non-power chair; also considerations for prosthesis      Pertinent Vitals/Pain Pain Assessment Pain Assessment: Faces Faces Pain Scale: No hurt Pain Intervention(s): Monitored during session    Home Living                          Prior Function            PT Goals (current goals can now be found in the care plan section) Acute Rehab PT Goals Patient Stated Goal: Prepare for prosthetics fitting PT Goal Formulation: With patient Time For Goal Achievement: 03/16/23 Potential to Achieve Goals: Good Progress towards PT goals: Progressing toward goals (slowly)    Frequency    Min 1X/week      PT Plan Other (comment)    Co-evaluation              AM-PAC PT "6 Clicks" Mobility   Outcome Measure  Help needed turning from your back to your side while in a flat bed without using bedrails?: None Help needed moving from lying on your back to sitting on the side of a flat bed without using bedrails?: None Help needed moving to and from a bed to a chair (including a wheelchair)?: A Little Help needed standing up from a chair using your arms (e.g., wheelchair or bedside chair)?: Total Help needed to walk in hospital room?: Total Help needed climbing 3-5 steps with a railing? : Total 6 Click Score: 14    End of Session   Activity Tolerance: Patient tolerated treatment well Patient left: Other (comment) (In wheelchair cruising halls) Nurse Communication: Mobility status (and I've contacted Ortho Tech re: Prosthetist Cpnsult) PT Visit Diagnosis: Muscle weakness (generalized) (M62.81);Other abnormalities of gait and mobility (R26.89)     Time: 1441-1459 (plus approx 10 minute conversation in hallway earlier today) PT Time Calculation (min) (ACUTE ONLY): 18 min  Charges:  $Self Care/Home Management: [place] [facility] [place] 03/02/2023, 7:15 PM

## 2023-03-02 NOTE — Progress Notes (Signed)
PROGRESS NOTE    [name]  [id number] DOB: Sep 09, 1966 DOA: 12/07/2022 PCP: Marliss Coots, NP   Brief Narrative: [name] is a 57 y.o. male with PMH chronic combined CHF, CAD, HLD, polyneuropathy, bipolar d/o, COPD, homelessness. Patient presented with left lower extremity chronic venous stasis wound with concern for superimposed infection.  He had been followed outpatient by podiatry as well prior to hospitalization. He was evaluated by orthopedic surgery and was recommended for undergoing left BKA.   Assessment and Plan:  Sepsis Blood cultures with no growth. Secondary to gangrene of left foot. Patient managed on empiric antibiotics and ultimately underwent left BKA.   Gangrene of left foot Patient managed empirically with Vancomycin, Cefepime and Flagyl. Orthopedic surgery consulted and patient underwent left BKA on 1/10.  Chronic combined systolic and diastolic heart failure Euvolemic. -Continue Lasix, Coreg  Primary hypertension -Continue Coreg, amlodipine, Imdur  COPD Stable. -Continue Dulera BID and albuterol PRN  Anxiety -Continue Atarax PRN  GERD -Continue Protonix  Constipation -MiraLAX , Dulcolax PRN  Chronic pain -Continue MS Contin 30 mg BID  Schizoaffective disorder Patient was managed on Abilify and Lexapro as an outpatient. Outpatient medications not being provided while inpatient at this time. Patient requesting to be restarted on antipsychotic medications because of thoughts, but declines to expand on thought characteristics and is unsure of what medication he was taking. States he does not think it is Abilify. Psychiatry evaluated and patient's symptoms have improved after he believed he received medication. -Psychiatry recommending Abilify and Lexapro if symptoms return and/or QTc is improved -QTc is 484 on 3/31, restart Abilify and Lexapro per psychiatry recommendations   DVT prophylaxis: Lovenox Code Status:   Code Status: Full  Code Family Communication: One friend at bedside Disposition Plan: Discharge pending continued TOC recommendations. Medically stable for discharge.   Consultants:  Orthopedic surgery Psychiatry  Procedures:  1/10: Left BKA  Antimicrobials: Vancomycin Cefepime Flagyl    Subjective: No concerns today per patient.  Objective: BP 109/67 (BP Location: Left Arm)   Pulse 66   Temp 97.7 F (36.5 C) (Oral)   Resp 20   Ht 5\' 9"  (1.753 m)   Wt 88.6 kg   SpO2 98%   BMI 28.84 kg/m   Examination:  General exam: Appears calm and comfortable    Data Reviewed: I have personally reviewed following labs and imaging studies  CBC Lab Results  Component Value Date   WBC 6.1 01/28/2023   RBC 4.55 01/28/2023   HGB 14.0 01/28/2023   HCT 40.5 01/28/2023   MCV 89.0 01/28/2023   MCH 30.8 01/28/2023   PLT 229 01/28/2023   MCHC 34.6 01/28/2023   RDW 14.6 01/28/2023   LYMPHSABS 2.1 12/27/2022   MONOABS 0.7 12/27/2022   EOSABS 0.1 12/27/2022   BASOSABS 0.1 [id number]     Last metabolic panel Lab Results  Component Value Date   NA 139 02/18/2023   K 3.8 02/18/2023   CL 101 02/18/2023   CO2 25 02/18/2023   BUN 14 02/18/2023   CREATININE 1.04 02/18/2023   GLUCOSE 100 (H) 02/18/2023   GFRNONAA >60 02/18/2023   GFRAA >60 08/21/2020   CALCIUM 9.5 02/18/2023   PHOS 4.7 (H) 07/18/2022   PROT 7.0 12/16/2022   ALBUMIN 3.0 (L) 12/16/2022   BILITOT 0.5 12/16/2022   ALKPHOS 57 12/16/2022   AST 12 (L) 12/16/2022   ALT 12 12/16/2022   ANIONGAP 13 02/18/2023    GFR: Estimated Creatinine Clearance: 89.5 mL/min (by C-G  formula based on SCr of 1.04 mg/dL).  No results found for this or any previous visit (from the past 240 hour(s)).    Radiology Studies: No results found.    LOS: 85 days    Cordelia Poche, MD [facility] 03/02/2023, 8:20 AM   If 7PM-7AM, please contact night-coverage [email or web address]

## 2023-03-03 DIAGNOSIS — I10 Essential (primary) hypertension: Secondary | ICD-10-CM | POA: Diagnosis not present

## 2023-03-03 DIAGNOSIS — I96 Gangrene, not elsewhere classified: Secondary | ICD-10-CM | POA: Diagnosis not present

## 2023-03-03 NOTE — Progress Notes (Signed)
PROGRESS NOTE    [name]  [id number] DOB: 11/11/1966 DOA: 12/07/2022 PCP: Marliss Coots, NP   Brief Narrative: [name] is a 57 y.o. male with a history of heart failure, CAD, right AKA, hyperlipidemia, polyneuropathy, schizoaffective/bipolar disorder, COPD. Patient presented secondary to left lower extremity chronic venous stasis with associated cellulitis/gangrene. Patient with associated sepsis. While hospitalized, was managed empirically on Vancomycin, Flagyl and Cefepime and underwent a left BKA on 12/10/2022. Hospitalization complicated by difficulty to placed.  Assessment and Plan:  Sepsis Blood cultures with no growth. Secondary to gangrene of left foot. Patient managed on empiric antibiotics and ultimately underwent left BKA.   Gangrene of left foot Cellulitis of left foot Patient managed empirically with Vancomycin, Cefepime and Flagyl. Orthopedic surgery consulted and patient underwent left BKA on 1/10.  Chronic combined systolic and diastolic heart failure Euvolemic. -Continue Lasix, Coreg  Primary hypertension -Continue Coreg, amlodipine, Imdur  COPD Stable. -Continue Dulera BID and albuterol PRN  Anxiety -Continue Atarax PRN  GERD -Continue Protonix  Constipation -MiraLAX , Dulcolax PRN  Chronic pain -Continue MS Contin 30 mg BID  Schizoaffective disorder Patient was managed on Abilify and Lexapro as an outpatient. Outpatient medications not being provided while inpatient at this time. Patient requesting to be restarted on antipsychotic medications because of thoughts, but declines to expand on thought characteristics and is unsure of what medication he was taking. States he does not think it is Abilify. Psychiatry evaluated and patient's symptoms have improved after he believed he received medication. -Psychiatry recommending Abilify and Lexapro if symptoms return and/or QTc is improved -QTc is 484 on 3/31, restarted Abilify and Lexapro  per psychiatry recommendations   DVT prophylaxis: Lovenox Code Status:   Code Status: Full Code Family Communication: One friend at bedside Disposition Plan: Discharge pending continued TOC recommendations. Medically stable for discharge.   Consultants:  Orthopedic surgery Psychiatry  Procedures:  1/10: Left BKA  Antimicrobials: Vancomycin Cefepime Flagyl    Subjective: Patient requests going out with a friend and no staff.  Objective: BP (!) 152/107 (BP Location: Left Arm)   Pulse 71   Temp 97.9 F (36.6 C) (Oral)   Resp 17   Ht 5\' 9"  (1.753 m)   Wt 88.6 kg   SpO2 98%   BMI 28.84 kg/m   Examination:  General exam: Appears calm and comfortable Respiratory system: Respiratory effort normal. Neurology: Alert and oriented.    Data Reviewed: I have personally reviewed following labs and imaging studies  CBC Lab Results  Component Value Date   WBC 6.1 01/28/2023   RBC 4.55 01/28/2023   HGB 14.0 01/28/2023   HCT 40.5 01/28/2023   MCV 89.0 01/28/2023   MCH 30.8 01/28/2023   PLT 229 01/28/2023   MCHC 34.6 01/28/2023   RDW 14.6 01/28/2023   LYMPHSABS 2.1 12/27/2022   MONOABS 0.7 12/27/2022   EOSABS 0.1 12/27/2022   BASOSABS 0.1 [id number]     Last metabolic panel Lab Results  Component Value Date   NA 139 02/18/2023   K 3.8 02/18/2023   CL 101 02/18/2023   CO2 25 02/18/2023   BUN 14 02/18/2023   CREATININE 1.04 02/18/2023   GLUCOSE 100 (H) 02/18/2023   GFRNONAA >60 02/18/2023   GFRAA >60 08/21/2020   CALCIUM 9.5 02/18/2023   PHOS 4.7 (H) 07/18/2022   PROT 7.0 12/16/2022   ALBUMIN 3.0 (L) 12/16/2022   BILITOT 0.5 12/16/2022   ALKPHOS 57 12/16/2022   AST 12 (L) 12/16/2022  ALT 12 12/16/2022   ANIONGAP 13 02/18/2023    GFR: Estimated Creatinine Clearance: 89.5 mL/min (by C-G formula based on SCr of 1.04 mg/dL).  No results found for this or any previous visit (from the past 240 hour(s)).    Radiology Studies: No results found.     LOS: 37 days    Cordelia Poche, MD [facility] 03/03/2023, 11:15 AM   If 7PM-7AM, please contact night-coverage [email or web address]

## 2023-03-04 DIAGNOSIS — L0390 Cellulitis, unspecified: Secondary | ICD-10-CM | POA: Diagnosis not present

## 2023-03-04 DIAGNOSIS — L03116 Cellulitis of left lower limb: Secondary | ICD-10-CM | POA: Diagnosis not present

## 2023-03-04 DIAGNOSIS — Z86718 Personal history of other venous thrombosis and embolism: Secondary | ICD-10-CM

## 2023-03-04 DIAGNOSIS — I5040 Unspecified combined systolic (congestive) and diastolic (congestive) heart failure: Secondary | ICD-10-CM | POA: Diagnosis not present

## 2023-03-04 DIAGNOSIS — I96 Gangrene, not elsewhere classified: Secondary | ICD-10-CM | POA: Diagnosis not present

## 2023-03-04 LAB — CBC
HCT: 44.1 % (ref 39.0–52.0)
Hemoglobin: 15.5 g/dL (ref 13.0–17.0)
MCH: 30.5 pg (ref 26.0–34.0)
MCHC: 35.1 g/dL (ref 30.0–36.0)
MCV: 86.8 fL (ref 80.0–100.0)
Platelets: 309 10*3/uL (ref 150–400)
RBC: 5.08 MIL/uL (ref 4.22–5.81)
RDW: 14.7 % (ref 11.5–15.5)
WBC: 5.6 10*3/uL (ref 4.0–10.5)
nRBC: 0 % (ref 0.0–0.2)

## 2023-03-04 LAB — COMPREHENSIVE METABOLIC PANEL
ALT: 16 U/L (ref 0–44)
AST: 17 U/L (ref 15–41)
Albumin: 3.5 g/dL (ref 3.5–5.0)
Alkaline Phosphatase: 62 U/L (ref 38–126)
Anion gap: 11 (ref 5–15)
BUN: 16 mg/dL (ref 6–20)
CO2: 24 mmol/L (ref 22–32)
Calcium: 9.3 mg/dL (ref 8.9–10.3)
Chloride: 103 mmol/L (ref 98–111)
Creatinine, Ser: 0.96 mg/dL (ref 0.61–1.24)
GFR, Estimated: >60 mL/min (ref >60)
Glucose, Bld: 127 mg/dL — ABNORMAL HIGH (ref 70–99)
Potassium: 3.6 mmol/L (ref 3.5–5.1)
Sodium: 138 mmol/L (ref 135–145)
Total Bilirubin: 0.4 mg/dL (ref 0.3–1.2)
Total Protein: 7.4 g/dL (ref 6.5–8.1)

## 2023-03-04 NOTE — Progress Notes (Signed)
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 PMH includes asthma, bioplar 1, depression, schizoaffective disorder, CVA, chronic CHF, homelessness, HTN, DM2, cocaine and cannabis use, and R AKA.    PT Comments    Continuing work on functional mobility and activity tolerance;  Session focused on taking a closer look at RLE (see general comments below) as [name] has stated he wants to pursue getting bilateral LE prostheses when he is able; Would like to get a Prosthetist's opinion on options for RLE prosthesis -- If it's an AKA, then it's quite a long one -- most of the femur has been spared, and his R patella is able to be palpated; Resembles more of a knee disarticulation, which complicates prosthesis and knee joint options; Would like for a Prosthetist come take an initial look for pre-prosthesis education, and for a skilled look at the R residual limb; I believe [name] will benefit from having all the information available to him so that he can have clear expectations of if the RLE can get a prosthesis, and what that would look like;   The Prosthetist Consult placed on 4/1 remains in the order history, and I contacted [name] to see if we can arrange for a meeting with a Prosthetist; Scheduled for a Prosthetist to come visit [name] on Friday, 4/5 at 8am, and I will request  that PT joins them; Discussed the appt for Fri at [place] with [name], and he agrees, and indicated 8am will be fine for him.   Recommendations for follow up therapy are one component of a multi-disciplinary discharge planning process, led by the attending physician.  Recommendations may be updated based on patient status, additional functional criteria and insurance authorization.  Follow Up Recommendations  Can patient physically be  transported by private vehicle:  (If car transfer is meaningful to pt, can work on that with sliding board)    Assistance Recommended at Discharge PRN  Patient can return home with the following A little help with walking and/or transfers;A little help with bathing/dressing/bathroom;Assistance with cooking/housework;Assist for transportation;Help with stairs or ramp for entrance   Equipment Recommendations   (Consider a new wheelchair; power chair is not an option if pt chooses to pursue getting prostheses)    Recommendations for Other Services Other (comment) (Prosthetist Consult for pre-prosthesis education and for a closer look at R residual limb, please)     Precautions / Restrictions Precautions Precautions: Fall Precaution Comments: R AKA, L BKA Restrictions RLE Weight Bearing: Non weight bearing LLE Weight Bearing: Non weight bearing     Mobility      Wheelchair Mobility Wheelchair Mobility Wheelchair propulsion: Both upper extremities Wheelchair parts: Independent Wheelchair Assistance Details (indicate cue type and reason): Spends lots of time in the wc throughout the day; cruising hallways  Modified Rankin (Stroke Patients Only)       Balance                                            Cognition Arousal/Alertness: Awake/alert Behavior During Therapy: WFL for tasks assessed/performed Overall Cognitive Status: No family/caregiver present to determine baseline cognitive functioning  General Comments: While [name] tends to ask what therapist suggests for session, he very much needs to "buy in" to tasks activities in order to participate        Exercises      General Comments General comments (skin integrity, edema, etc.): Noted throughout medical record "R AKA", and have observed that femur length is longer than a typical AKA; [name] allowed palapation of distal R residual limb, and noted pt's R  patella is still present, as well as some soft tissue that mimics knee extension when pt activates his R quad; did not palpate any bony tissue that may be remnants of tibia, but also noteworthy that he had some tenderness to palpation at distal RLE; took extra time to dig into chart review, and did not find any relevant R LE amputaion op note (though perhaps the chart review wasn't exhaustive)      Pertinent Vitals/Pain Pain Assessment Pain Assessment: Faces Faces Pain Scale: Hurts a little bit Pain Location: Noted grimace R distal residual limb with palpation; palpated patella Pain Descriptors / Indicators: Grimacing Pain Intervention(s): Monitored during session    Home Living                          Prior Function            PT Goals (current goals can now be found in the care plan section) Acute Rehab PT Goals Patient Stated Goal: Prepare for prosthetics fitting PT Goal Formulation: With patient Time For Goal Achievement: 03/18/23 Potential to Achieve Goals: Good Additional Goals Additional Goal #1: Pt will transfer from seated in WC to floor, and then from floor back to [name] with supervision. Progress towards PT goals: Progressing toward goals (Slowly)    Frequency    Min 1X/week      PT Plan Current plan remains appropriate (TOC continues to search for safe, appropriate venueto dc to; noted [name] is asking that pt go to SNF for rehab -- he has been undergoing rehab here acutely for quite some time)    Co-evaluation              AM-PAC PT "6 Clicks" Mobility   Outcome Measure  Help needed turning from your back to your side while in a flat bed without using bedrails?: None Help needed moving from lying on your back to sitting on the side of a flat bed without using bedrails?: None Help needed moving to and from a bed to a chair (including a wheelchair)?: A Little Help needed standing up from a chair using your arms (e.g., wheelchair or bedside  chair)?: Total Help needed to walk in [name] room?: Total Help needed climbing 3-5 steps with a railing? : Total 6 Click Score: 14    End of Session   Activity Tolerance: Patient tolerated treatment well Patient left: Other (comment) (in wheelchair cruising halls)   PT Visit Diagnosis: Muscle weakness (generalized) (M62.81);Other abnormalities of gait and mobility (R26.89)     Time: 1450-1500 PT Time Calculation (min) (ACUTE ONLY): 10 min  Charges:  $Therapeutic Activity: 8-22 mins                     Roney Marion, PT  Acute Rehabilitation Services Office [phone]    [place] 03/04/2023, 8:06 PM

## 2023-03-04 NOTE — Progress Notes (Signed)
PROGRESS NOTE    [name]  [id number] DOB: [birth year] DOA: 12/07/2022 PCP: Marliss Coots, NP   Brief Narrative: [name] is a 57 y.o. male with a history of heart failure, CAD, right AKA, hyperlipidemia, polyneuropathy, schizoaffective/bipolar disorder, COPD hospital with left lower extremity cellulitis and gangrene.  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  Hospital stay prolonged due to difficulty in placement.  Assessment and Plan:  Sepsis secondary to cellulitis/gangrene of left foot Initially received vancomycin, Cefepime and Flagyl.  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  Previous history of right below-knee amputation.  Chronic combined systolic and diastolic heart failure Appears compensated.  Continue Lasix.  Essential hypertension -Continue Coreg, amlodipine, Imdur  COPD Compensated. Continue Dulera BID and albuterol PRN  Anxiety -Continue Atarax PRN  GERD -Continue Protonix  Constipation -MiraLAX , Dulcolax PRN  Chronic pain -Continue MS Contin 30 mg BID  Schizoaffective disorder Patient was on Abilify and Lexapro as an outpatient.  Psychiatry consulted during hospitalization and recommended Abilify and Lexapro if symptoms return and/or QTc is improved.  Abilify and Lexapro were initiated 03/01/2023.   DVT prophylaxis: Lovenox subcu  Code Status:   Code Status: Full Code  Family Communication:  ?? One friend at bedside  Disposition Plan:  Discharge pending TOC recommendations. Medically stable for discharge.   Consultants:  Orthopedic surgery Psychiatry  Procedures:  12/10/22: Left BKA  Antimicrobials: none   Subjective: Today, patient was seen and examined at bedside.  Patient wishes to go outside without any staff.  Denies any fever, chills or rigor.  Objective: BP (!) 151/114   Pulse 70   Temp 97.6 F (36.4 C) (Oral)   Resp 17   Ht 5\' 9"   (1.753 m)   Wt 88.6 kg   SpO2 99%   BMI 28.84 kg/m   Examination: Body mass index is 28.84 kg/m.  General:  Average built, not in obvious distress HENT:   No scleral pallor or icterus noted. Oral mucosa is moist.  Chest:  Clear breath sounds.  No crackles or wheezes.  CVS: S1 &S2 heard. No murmur.  Regular rate and rhythm. Abdomen: Soft, nontender, nondistended.  Bowel sounds are heard.   Extremities: Bilateral below-knee amputation. Psych: Alert, awake oriented CNS:  No cranial nerve deficits.  Moves upper extremities bilateral lower extremity BKA. Skin: Warm and dry.  No rashes noted.  Data Reviewed: I have personally reviewed the following labs and imaging studies.    CBC Lab Results  Component Value Date   WBC 6.1 01/28/2023   RBC 4.55 01/28/2023   HGB 14.0 01/28/2023   HCT 40.5 01/28/2023   MCV 89.0 01/28/2023   MCH 30.8 01/28/2023   PLT 229 01/28/2023   MCHC 34.6 01/28/2023   RDW 14.6 01/28/2023   LYMPHSABS 2.1 12/27/2022   MONOABS 0.7 12/27/2022   EOSABS 0.1 12/27/2022   BASOSABS 0.1 [id number]     Last metabolic panel Lab Results  Component Value Date   NA 139 02/18/2023   K 3.8 02/18/2023   CL 101 02/18/2023   CO2 25 02/18/2023   BUN 14 02/18/2023   CREATININE 1.04 02/18/2023   GLUCOSE 100 (H) 02/18/2023   GFRNONAA >60 02/18/2023   GFRAA >60 08/21/2020   CALCIUM 9.5 02/18/2023   PHOS 4.7 (H) 07/18/2022   PROT 7.0 12/16/2022   ALBUMIN 3.0 (L) 12/16/2022   BILITOT 0.5 12/16/2022   ALKPHOS 57 12/16/2022   AST 12 (  L) 12/16/2022   ALT 12 12/16/2022   ANIONGAP 13 02/18/2023    GFR: Estimated Creatinine Clearance: 89.5 mL/min (by C-G formula based on SCr of 1.04 mg/dL).  No results found for this or any previous visit (from the past 240 hour(s)).    Radiology Studies: No results found.    LOS: 23 days    Flora Lipps, MD [facility] 03/04/2023, 7:23 AM  If 7PM-7AM, please contact night-coverage [email or web address]

## 2023-03-05 DIAGNOSIS — L0390 Cellulitis, unspecified: Secondary | ICD-10-CM | POA: Diagnosis not present

## 2023-03-05 DIAGNOSIS — J449 Chronic obstructive pulmonary disease, unspecified: Secondary | ICD-10-CM | POA: Diagnosis not present

## 2023-03-05 DIAGNOSIS — I96 Gangrene, not elsewhere classified: Secondary | ICD-10-CM | POA: Diagnosis not present

## 2023-03-05 DIAGNOSIS — I5040 Unspecified combined systolic (congestive) and diastolic (congestive) heart failure: Secondary | ICD-10-CM | POA: Diagnosis not present

## 2023-03-05 NOTE — Progress Notes (Signed)
PROGRESS NOTE    [name]  [id number] DOB: 1966/01/19 DOA: 12/07/2022 PCP: Marliss Coots, NP   Brief Narrative: [name] is a 57 y.o. male with a history of heart failure, CAD, right AKA, hyperlipidemia, polyneuropathy, schizoaffective/bipolar disorder, COPD hospital with left lower extremity cellulitis and gangrene.  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  Hospital stay prolonged due to difficulty in placement.  Assessment and Plan:  Sepsis secondary to cellulitis/gangrene of left foot Initially received vancomycin, Cefepime and Flagyl.  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  Previous history of right below-knee amputation.  Currently unsafe for disposition.  Chronic combined systolic and diastolic heart failure Appears compensated.  Continue Lasix  Essential hypertension -Continue Coreg, amlodipine, Imdur  COPD Compensated. Continue Dulera BID and albuterol PRN  Anxiety -Continue Atarax PRN  GERD -Continue Protonix  Constipation -MiraLAX , Dulcolax PRN  Chronic pain -Continue MS Contin 30 mg BID  Schizoaffective disorder Seen by psychiatry during hospitalization.  Has been started on Abilify and Lexapro.   DVT prophylaxis: Lovenox subcu  Code Status:   Code Status: Full Code  Family Communication:  None at bedside.  Disposition Plan:  Discharge pending TOC recommendations. Medically stable for discharge.   Consultants:  Orthopedic surgery Psychiatry  Procedures:  12/10/22: Left BKA  Antimicrobials: None currently.  Subjective: Today, patient was seen and examined at bedside.  Patient denies any nausea vomiting fever chills or rigor.  Wheeling on the hallway.  Objective: BP (!) 153/101 (BP Location: Left Arm)   Pulse 63   Temp (!) 97.5 F (36.4 C) (Oral)   Resp 17   Ht 5\' 9"  (1.753 m)   Wt 88.6 kg   SpO2 100%   BMI 28.84 kg/m    Examination: Body mass index is 28.84 kg/m.   General:  Average built, not in obvious distress HENT:   No scleral pallor or icterus noted. Oral mucosa is moist.  Chest:  Clear breath sounds.  No crackles or wheezes.  CVS: S1 &S2 heard. No murmur.  Regular rate and rhythm. Abdomen: Soft, nontender, nondistended.  Bowel sounds are heard.   Extremities: Status post below knee amputation bilaterally. Psych: Alert, awake oriented CNS:  No cranial nerve deficits.   Skin: Warm and dry.  No rashes noted.  Data Reviewed: I have personally reviewed the following labs and imaging studies.    CBC Lab Results  Component Value Date   WBC 5.6 03/04/2023   RBC 5.08 03/04/2023   HGB 15.5 03/04/2023   HCT 44.1 03/04/2023   MCV 86.8 03/04/2023   MCH 30.5 03/04/2023   PLT 309 03/04/2023   MCHC 35.1 03/04/2023   RDW 14.7 03/04/2023   LYMPHSABS 2.1 12/27/2022   MONOABS 0.7 12/27/2022   EOSABS 0.1 12/27/2022   BASOSABS 0.1 [id number]     Last metabolic panel Lab Results  Component Value Date   NA 138 03/04/2023   K 3.6 03/04/2023   CL 103 03/04/2023   CO2 24 03/04/2023   BUN 16 03/04/2023   CREATININE 0.96 03/04/2023   GLUCOSE 127 (H) 03/04/2023   GFRNONAA >60 03/04/2023   GFRAA >60 08/21/2020   CALCIUM 9.3 03/04/2023   PHOS 4.7 (H) 07/18/2022   PROT 7.4 03/04/2023   ALBUMIN 3.5 03/04/2023   BILITOT 0.4 03/04/2023   ALKPHOS 62 03/04/2023   AST 17 03/04/2023   ALT 16 03/04/2023   ANIONGAP 11 03/04/2023    GFR: Estimated  Creatinine Clearance: 96.9 mL/min (by C-G formula based on SCr of 0.96 mg/dL).  No results found for this or any previous visit (from the past 240 hour(s)).    Radiology Studies: No results found.    LOS: 48 days    Flora Lipps, MD [facility] 03/05/2023, 9:40 AM  If 7PM-7AM, please contact night-coverage [email or web address]

## 2023-03-05 NOTE — Plan of Care (Signed)

## 2023-03-06 DIAGNOSIS — J449 Chronic obstructive pulmonary disease, unspecified: Secondary | ICD-10-CM | POA: Diagnosis not present

## 2023-03-06 DIAGNOSIS — I5040 Unspecified combined systolic (congestive) and diastolic (congestive) heart failure: Secondary | ICD-10-CM | POA: Diagnosis not present

## 2023-03-06 DIAGNOSIS — L0390 Cellulitis, unspecified: Secondary | ICD-10-CM | POA: Diagnosis not present

## 2023-03-06 DIAGNOSIS — I96 Gangrene, not elsewhere classified: Secondary | ICD-10-CM | POA: Diagnosis not present

## 2023-03-06 MED ORDER — NYSTATIN 100000 UNIT/ML MT SUSP
5.0000 mL | Freq: Four times a day (QID) | OROMUCOSAL | Status: DC
  Administered 2023-03-06 – 2023-05-26 (×197): 500000 [IU] via ORAL
  Filled 2023-03-06 (×227): qty 5

## 2023-03-06 NOTE — Progress Notes (Signed)
PT Cancellation Note  Patient Details Name: [name] MRN: [id number] DOB: Feb 22, 1966   Cancelled Treatment:    Reason Eval/Treat Not Completed: Patient declined, no reason specified  Minimally conversant with this therapist. Inquired about meeting with prosthetist this morning. States he is a good candidate and he is to provide material from [facility] to Dr. Lajoyce Corners and will plan on following up with them on an outpatient basis. Offered to assist with process, declined. Offered to have therapist [name]. Speak with patient, declined.  Declines to work further with PT today.    Berton Mount 03/06/2023, 8:55 AM

## 2023-03-06 NOTE — Progress Notes (Signed)
PROGRESS NOTE    [name]  BLT:[id number] DOB: 08-29-1966 DOA: 12/07/2022 PCP: [name], NP   Brief Narrative: [name] is a 57 y.o. male with a history of heart failure, CAD, right AKA, hyperlipidemia, polyneuropathy, schizoaffective/bipolar disorder, COPD hospital with left lower extremity cellulitis and gangrene.  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  Hospital stay prolonged due to difficulty in placement.  Assessment and Plan:  Sepsis secondary to cellulitis/gangrene of left foot Initially received vancomycin, Cefepime and Flagyl.  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  Previous history of right below-knee amputation.  Currently unsafe for disposition.  Chronic combined systolic and diastolic heart failure Appears compensated.  Continue Lasix  Essential hypertension -Continue Coreg, amlodipine, Imdur  COPD Compensated. Continue Dulera BID and albuterol PRN  Anxiety -Continue Atarax PRN  GERD -Continue Protonix  Constipation -MiraLAX , Dulcolax PRN  Chronic pain -Continue MS Contin 30 mg BID  Schizoaffective disorder Seen by psychiatry during hospitalization.  Has been started on Abilify and Lexapro.  Oral thrush.  Complains of some exudate and bleeding.  On Magic mouthwash.  Has a history of QT prolongation.  Will change to nystatin swish and swallow.  DVT prophylaxis: Lovenox subcu  Code Status:   Code Status: Full Code  Family Communication:  None at bedside.  Disposition Plan:  Discharge pending TOC recommendations. Medically stable for discharge.   Consultants:  Orthopedic surgery Psychiatry  Procedures:  12/10/22: Left BKA  Antimicrobials: None currently.  Subjective: Today, patient was seen and examined at bedside.  Complains of some exudate in the mouth with occasional gingival bleeding.  Objective: BP (!) 146/108 (BP Location: Left Arm)    Pulse 75   Temp 98.8 F (37.1 C) (Oral)   Resp 18   Ht 5\' 9"  (1.753 m)   Wt 88.6 kg   SpO2 96%   BMI 28.84 kg/m   Examination: Body mass index is 28.84 kg/m.   General:  Average built, not in obvious distress HENT:   No scleral pallor or icterus noted. Oral mucosa is moist.  Edentulous, gingival inflammation with discolored patches.. Chest:  Clear breath sounds.  No crackles or wheezes.  CVS: S1 &S2 heard. No murmur.  Regular rate and rhythm. Abdomen: Soft, nontender, nondistended.  Bowel sounds are heard.   Extremities: Bilateral below-knee amputation. Psych: Alert, awake oriented CNS:  No cranial nerve deficits.   Skin: Warm and dry.  No rashes noted.  Data Reviewed: I have personally reviewed the following labs and imaging studies.    CBC Lab Results  Component Value Date   WBC 5.6 03/04/2023   RBC 5.08 03/04/2023   HGB 15.5 03/04/2023   HCT 44.1 03/04/2023   MCV 86.8 03/04/2023   MCH 30.5 03/04/2023   PLT 309 03/04/2023   MCHC 35.1 03/04/2023   RDW 14.7 03/04/2023   LYMPHSABS 2.1 12/27/2022   MONOABS 0.7 12/27/2022   EOSABS 0.1 12/27/2022   BASOSABS 0.1 12/27/2022     Last metabolic panel Lab Results  Component Value Date   NA 138 03/04/2023   K 3.6 03/04/2023   CL 103 03/04/2023   CO2 24 03/04/2023   BUN 16 03/04/2023   CREATININE 0.96 03/04/2023   GLUCOSE 127 (H) 03/04/2023   GFRNONAA >60 03/04/2023   GFRAA >60 08/21/2020   CALCIUM 9.3 03/04/2023   PHOS 4.7 (H) 07/18/2022   PROT 7.4 03/04/2023   ALBUMIN 3.5 03/04/2023   BILITOT  0.4 03/04/2023   ALKPHOS 62 03/04/2023   AST 17 03/04/2023   ALT 16 03/04/2023   ANIONGAP 11 03/04/2023    GFR: Estimated Creatinine Clearance: 96.9 mL/min (by C-G formula based on SCr of 0.96 mg/dL).  No results found for this or any previous visit (from the past 240 hour(s)).    Radiology Studies: No results found.    LOS: 89 days    Joycelyn Das, MD [facility] 03/06/2023, 2:20 PM  If 7PM-7AM,  please contact night-coverage [email or web address]

## 2023-03-06 NOTE — Plan of Care (Signed)
  Problem: Pain Managment: Goal: General experience of comfort will improve Outcome: Progressing   Problem: Safety: Goal: Ability to remain free from injury will improve Outcome: Progressing   Problem: Skin Integrity: Goal: Risk for impaired skin integrity will decrease Outcome: Progressing   Problem: Health Behavior/Discharge Planning: Goal: Ability to manage health-related needs will improve Outcome: Progressing

## 2023-03-07 DIAGNOSIS — I96 Gangrene, not elsewhere classified: Secondary | ICD-10-CM | POA: Diagnosis not present

## 2023-03-07 DIAGNOSIS — J449 Chronic obstructive pulmonary disease, unspecified: Secondary | ICD-10-CM | POA: Diagnosis not present

## 2023-03-07 DIAGNOSIS — I5040 Unspecified combined systolic (congestive) and diastolic (congestive) heart failure: Secondary | ICD-10-CM | POA: Diagnosis not present

## 2023-03-07 DIAGNOSIS — L0390 Cellulitis, unspecified: Secondary | ICD-10-CM | POA: Diagnosis not present

## 2023-03-07 NOTE — Progress Notes (Addendum)
Reported to Charge nurse this morning that patient was offering narcotics to staff members on night shift. Later today it was brought to the attention of the charge nurse that the patient had done the same and actually handed the employee some pills in a paper towel which she in turn reported to the charge/primary nurse. Upon observation. There were a total of 6 pills: (2) 15mg  MS Contin and (4) 5mg  oxy IR which are both order medication for this patient. The narcotics were disposed of in the stericycle and witnessed by Cathleen Fears, RN. The attending, nursing supervisor, and department were made of aware of incident. Dr. Tyson Babinski phone into the patients room to speak to him regarding the incident. He denied it occurred, but then rolled around in the unit in his wheelchair saying "someone snitched on me and he was only trying to help".

## 2023-03-07 NOTE — Plan of Care (Signed)
  Problem: Fluid Volume: Goal: Hemodynamic stability will improve Outcome: Progressing   Problem: Activity: Goal: Risk for activity intolerance will decrease Outcome: Progressing   Problem: Nutrition: Goal: Adequate nutrition will be maintained Outcome: Progressing   Problem: Coping: Goal: Level of anxiety will decrease Outcome: Progressing   Problem: Pain Managment: Goal: General experience of comfort will improve Outcome: Progressing   Problem: Safety: Goal: Ability to remain free from injury will improve Outcome: Progressing   Problem: Skin Integrity: Goal: Risk for impaired skin integrity will decrease Outcome: Progressing   Problem: Education: Goal: Knowledge of General Education information will improve Description: Including pain rating scale, medication(s)/side effects and non-pharmacologic comfort measures Outcome: Progressing   Problem: Health Behavior/Discharge Planning: Goal: Ability to manage health-related needs will improve Outcome: Progressing   Problem: Clinical Measurements: Goal: Ability to maintain clinical measurements within normal limits will improve Outcome: Progressing Goal: Will remain free from infection Outcome: Progressing

## 2023-03-07 NOTE — Progress Notes (Signed)
PROGRESS NOTE    [name]  UJW:[id number] DOB: 1966-06-06 DOA: 12/07/2022 PCP: [name], NP   Brief Narrative: [name] is a 57 y.o. male with a history of heart failure, CAD, right AKA, hyperlipidemia, polyneuropathy, schizoaffective/bipolar disorder, COPD hospital with left lower extremity cellulitis and gangrene.  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  Hospital stay prolonged due to difficulty in placement.  Assessment and Plan:  Sepsis secondary to cellulitis/gangrene of left foot Initially received vancomycin, Cefepime and Flagyl.  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  Previous history of right below-knee amputation.  Currently unsafe for disposition.  Chronic combined systolic and diastolic heart failure Appears compensated.  Continue Lasix, Coreg, hydralazine, Imdur,  Essential hypertension -Continue Coreg, amlodipine, Imdur  COPD Compensated. Continue Dulera BID and albuterol PRN  Anxiety -Continue Atarax PRN  GERD -Continue Protonix  Constipation -MiraLAX , Dulcolax PRN  Chronic pain -Continue MS Contin 30 mg BID and oxycodone 20 every 4.  Schizoaffective disorder Seen by psychiatry during hospitalization.  Has been started on Abilify and Lexapro.  Oral thrush.  Continue nystatin swish and swallow.  DVT prophylaxis: Lovenox subcu  Code Status:   Code Status: Full Code  Family Communication:  None at bedside.  Disposition Plan:  Discharge pending TOC recommendations. Medically stable for discharge.   Consultants:  Orthopedic surgery Psychiatry  Procedures:  12/10/22: Left BKA  Antimicrobials: None currently.  Subjective: Today, patient was seen and examined at bedside.  Denies interval complaints.  Objective: BP 128/85 (BP Location: Right Arm)   Pulse 70   Temp 98.8 F (37.1 C) (Oral)   Resp 19   Ht 5\' 9"  (1.753 m)   Wt 88.6 kg   SpO2  100%   BMI 28.84 kg/m   Examination: Body mass index is 28.84 kg/m.   General:  Average built, not in obvious distress HENT:   No scleral pallor or icterus noted. Oral mucosa is moist.  Discolored patch with gingival inflammation. Chest:  Clear breath sounds.  Diminished breath sounds bilaterally. No crackles or wheezes.  CVS: S1 &S2 heard. No murmur.  Regular rate and rhythm. Abdomen: Soft, nontender, nondistended.  Bowel sounds are heard.   Extremities: Bilateral below-knee amputation. Psych: Alert, awake and oriented, normal mood CNS:  No cranial nerve deficits.  Power equal in all extremities.   Skin: Warm and dry.  No rashes noted.   Data Reviewed: I have personally reviewed the following labs and imaging studies.    CBC Lab Results  Component Value Date   WBC 5.6 03/04/2023   RBC 5.08 03/04/2023   HGB 15.5 03/04/2023   HCT 44.1 03/04/2023   MCV 86.8 03/04/2023   MCH 30.5 03/04/2023   PLT 309 03/04/2023   MCHC 35.1 03/04/2023   RDW 14.7 03/04/2023   LYMPHSABS 2.1 12/27/2022   MONOABS 0.7 12/27/2022   EOSABS 0.1 12/27/2022   BASOSABS 0.1 12/27/2022     Last metabolic panel Lab Results  Component Value Date   NA 138 03/04/2023   K 3.6 03/04/2023   CL 103 03/04/2023   CO2 24 03/04/2023   BUN 16 03/04/2023   CREATININE 0.96 03/04/2023   GLUCOSE 127 (H) 03/04/2023   GFRNONAA >60 03/04/2023   GFRAA >60 08/21/2020   CALCIUM 9.3 03/04/2023   PHOS 4.7 (H) 07/18/2022   PROT 7.4 03/04/2023   ALBUMIN 3.5 03/04/2023   BILITOT 0.4 03/04/2023   ALKPHOS 62 03/04/2023  AST 17 03/04/2023   ALT 16 03/04/2023   ANIONGAP 11 03/04/2023    GFR: Estimated Creatinine Clearance: 96.9 mL/min (by C-G formula based on SCr of 0.96 mg/dL).  No results found for this or any previous visit (from the past 240 hour(s)).    Radiology Studies: No results found.    LOS: 90 days    Joycelyn Das, MD [facility] 03/07/2023, 9:51 AM  If 7PM-7AM, please contact  night-coverage [email or web address]

## 2023-03-07 NOTE — Progress Notes (Signed)
Patient refuses nursing assessment, however patient is up in his chair moving about the unit and room. No signs of acute distress.

## 2023-03-08 DIAGNOSIS — J449 Chronic obstructive pulmonary disease, unspecified: Secondary | ICD-10-CM | POA: Diagnosis not present

## 2023-03-08 DIAGNOSIS — I5040 Unspecified combined systolic (congestive) and diastolic (congestive) heart failure: Secondary | ICD-10-CM | POA: Diagnosis not present

## 2023-03-08 DIAGNOSIS — I96 Gangrene, not elsewhere classified: Secondary | ICD-10-CM | POA: Diagnosis not present

## 2023-03-08 DIAGNOSIS — L0390 Cellulitis, unspecified: Secondary | ICD-10-CM | POA: Diagnosis not present

## 2023-03-08 NOTE — Plan of Care (Signed)
  Problem: Fluid Volume: Goal: Hemodynamic stability will improve Outcome: Progressing   Problem: Activity: Goal: Risk for activity intolerance will decrease Outcome: Progressing   

## 2023-03-08 NOTE — Progress Notes (Signed)
Patient refuses nursing assessment, stating " I don't do assessments". Patient is up this morning with a visitor at bedside. No signs of acute distress noted.

## 2023-03-08 NOTE — Progress Notes (Signed)
PROGRESS NOTE    [name]  [id number] DOB: [birth year] DOA: 12/07/2022 PCP: [name], NP   Brief Narrative: [name] is a 57 y.o. male with a history of heart failure, CAD, right AKA, hyperlipidemia, polyneuropathy, schizoaffective/bipolar disorder, COPD hospital with left lower extremity cellulitis and gangrene.  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  Hospital stay prolonged due to difficulty in placement.  Assessment and Plan:  Sepsis secondary to cellulitis/gangrene of left foot Initially received vancomycin, Cefepime and Flagyl.  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  Previous history of right below-knee amputation.  Currently unsafe for disposition.  Chronic combined systolic and diastolic heart failure Appears compensated.  Continue Lasix, Coreg, hydralazine, Imdur,  Essential hypertension -Continue Coreg, amlodipine, Imdur.  Will continue to monitor blood pressure.  COPD Compensated. Continue Dulera BID and albuterol PRN.  No wheezing.  Anxiety -Continue Atarax PRN  GERD -Continue Protonix  Constipation -MiraLAX , Dulcolax PRN  Chronic pain -Continue MS Contin 30 mg BID and oxycodone 20 every 4 hourly.  There was some concern about having extra doses and was offering to the staff.  Counseled about it.  Direct supervision.  Schizoaffective disorder Seen by psychiatry during hospitalization.  Has been started on Abilify and Lexapro.  Oral thrush.  Continue nystatin swish and swallow.  DVT prophylaxis: Lovenox subcu  Code Status:   Code Status: Full Code  Family Communication:  None at bedside.  Disposition Plan:  Discharge pending TOC recommendations. Medically stable for discharge.   Consultants:  Orthopedic surgery Psychiatry  Procedures:  12/10/22: Left BKA  Antimicrobials: None currently.  Subjective: Today, patient was seen and examined at  bedside.  Denies interval complaints.  Objective: BP (!) 156/97 (BP Location: Left Arm)   Pulse 80   Temp 98.3 F (36.8 C)   Resp 18   Ht 5\' 9"  (1.753 m)   Wt 88.6 kg   SpO2 100%   BMI 28.84 kg/m   Examination: Body mass index is 28.84 kg/m.   General:  Average built, not in obvious distress HENT:   No scleral pallor or icterus noted. Oral mucosa is moist.  Gingival inflammation with some discolored patch.   Chest:  Clear breath sounds.  No crackles or wheezes.  CVS: S1 &S2 heard. No murmur.  Regular rate and rhythm. Abdomen: Soft, nontender, nondistended.  Bowel sounds are heard.   Extremities: Bilateral below-knee amputation. Psych: Alert, awake and oriented, normal mood CNS:  No cranial nerve deficits.  Bilateral below-knee amputation. Skin: Warm and dry.  No rashes noted.   Data Reviewed: I have personally reviewed the following labs and imaging studies.    CBC Lab Results  Component Value Date   WBC 5.6 03/04/2023   RBC 5.08 03/04/2023   HGB 15.5 03/04/2023   HCT 44.1 03/04/2023   MCV 86.8 03/04/2023   MCH 30.5 03/04/2023   PLT 309 03/04/2023   MCHC 35.1 03/04/2023   RDW 14.7 03/04/2023   LYMPHSABS 2.1 12/27/2022   MONOABS 0.7 12/27/2022   EOSABS 0.1 12/27/2022   BASOSABS 0.1 12/27/2022     Last metabolic panel Lab Results  Component Value Date   NA 138 03/04/2023   K 3.6 03/04/2023   CL 103 03/04/2023   CO2 24 03/04/2023   BUN 16 03/04/2023   CREATININE 0.96 03/04/2023   GLUCOSE 127 (H) 03/04/2023   GFRNONAA >60 03/04/2023   GFRAA >60 08/21/2020   CALCIUM 9.3  03/04/2023   PHOS 4.7 (H) 07/18/2022   PROT 7.4 03/04/2023   ALBUMIN 3.5 03/04/2023   BILITOT 0.4 03/04/2023   ALKPHOS 62 03/04/2023   AST 17 03/04/2023   ALT 16 03/04/2023   ANIONGAP 11 03/04/2023    GFR: Estimated Creatinine Clearance: 96.9 mL/min (by C-G formula based on SCr of 0.96 mg/dL).  No results found for this or any previous visit (from the past 240 hour(s)).     Radiology Studies: No results found.    LOS: 91 days    Joycelyn Das, MD [facility] 03/08/2023, 9:27 AM  If 7PM-7AM, please contact night-coverage [email or web address]

## 2023-03-08 NOTE — Progress Notes (Signed)
Request to see pt had swallowed pain meds-pt refused - security notified as well as charge nurse

## 2023-03-09 DIAGNOSIS — I96 Gangrene, not elsewhere classified: Secondary | ICD-10-CM | POA: Diagnosis not present

## 2023-03-09 DIAGNOSIS — L0390 Cellulitis, unspecified: Secondary | ICD-10-CM | POA: Diagnosis not present

## 2023-03-09 DIAGNOSIS — J449 Chronic obstructive pulmonary disease, unspecified: Secondary | ICD-10-CM | POA: Diagnosis not present

## 2023-03-09 DIAGNOSIS — I5040 Unspecified combined systolic (congestive) and diastolic (congestive) heart failure: Secondary | ICD-10-CM | POA: Diagnosis not present

## 2023-03-09 NOTE — Progress Notes (Signed)
Pt refused inhaler at this time stating it does not do anything for him and he does not want to take his inhaler anymore.

## 2023-03-09 NOTE — Progress Notes (Signed)
PROGRESS NOTE    [name]  [id number] DOB: Aug 15, 1966 DOA: 12/07/2022 PCP: [name], NP   Brief Narrative: [name] is a 57 y.o. male with a history of heart failure, CAD, right AKA, hyperlipidemia, polyneuropathy, schizoaffective/bipolar disorder, COPD hospital with left lower extremity cellulitis and gangrene.  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  Hospital stay prolonged due to difficulty in placement.  Assessment and Plan:  Sepsis secondary to cellulitis/gangrene of left foot Initially received vancomycin, Cefepime and Flagyl.  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  Previous history of right below-knee amputation.  Currently unsafe for disposition.  Chronic combined systolic and diastolic heart failure Appears compensated.  Continue Lasix, Coreg, hydralazine, Imdur,  Essential hypertension -Continue Coreg, amlodipine, Imdur.  Blood pressure is stable.  COPD Compensated. Continue Dulera BID and albuterol PRN.  No wheezing.  Anxiety -Continue Atarax PRN  GERD -Continue Protonix  Constipation -MiraLAX , Dulcolax PRN  Chronic pain On MS Contin 30 mg BID and oxycodone 20 every 4 hourly.  Will need direct supervision while taking pills  Schizoaffective disorder Seen by psychiatry during hospitalization.  Has been started on Abilify and Lexapro.  Oral thrush.  Continue nystatin swish and swallow.  DVT prophylaxis: Lovenox subcu  Code Status:   Code Status: Full Code  Family Communication:  None at bedside.  Disposition Plan:  Discharge pending TOC recommendations. Medically stable for discharge.   Consultants:  Orthopedic surgery Psychiatry  Procedures:  12/10/22: Left BKA  Antimicrobials: None currently.  Subjective: Today, patient was seen and examined at bedside.  Patient's friend at bedside.  Has been wheeling around in a wheelchair.  Denies  interval complaints.  Objective: BP (!) 127/98 (BP Location: Left Arm)   Pulse 71   Temp (!) 97.2 F (36.2 C) (Oral)   Resp 18   Ht 5\' 9"  (1.753 m)   Wt 88.6 kg   SpO2 97%   BMI 28.84 kg/m   Examination: Body mass index is 28.84 kg/m.   General:  Average built, not in obvious distress, on a wheelchair. HENT:   No scleral pallor or icterus noted. Oral mucosa is moist.  Improved gingival discoloration.   Chest:  Clear breath sounds.  No crackles or wheezes.  CVS: S1 &S2 heard. No murmur.  Regular rate and rhythm. Abdomen: Soft, nontender, nondistended.  Bowel sounds are heard.   Extremities: Bilateral below-knee amputation. Psych: Alert, awake and oriented, normal mood CNS:  No cranial nerve deficits.  Bilateral below-knee amputation. Skin: Warm and dry   Data Reviewed: I have personally reviewed the following labs and imaging studies.    CBC Lab Results  Component Value Date   WBC 5.6 03/04/2023   RBC 5.08 03/04/2023   HGB 15.5 03/04/2023   HCT 44.1 03/04/2023   MCV 86.8 03/04/2023   MCH 30.5 03/04/2023   PLT 309 03/04/2023   MCHC 35.1 03/04/2023   RDW 14.7 03/04/2023   LYMPHSABS 2.1 12/27/2022   MONOABS 0.7 12/27/2022   EOSABS 0.1 12/27/2022   BASOSABS 0.1 12/27/2022     Last metabolic panel Lab Results  Component Value Date   NA 138 03/04/2023   K 3.6 03/04/2023   CL 103 03/04/2023   CO2 24 03/04/2023   BUN 16 03/04/2023   CREATININE 0.96 03/04/2023   GLUCOSE 127 (H) 03/04/2023   GFRNONAA >60 03/04/2023   GFRAA >60 08/21/2020   CALCIUM 9.3 03/04/2023   PHOS 4.7 (  H) 07/18/2022   PROT 7.4 03/04/2023   ALBUMIN 3.5 03/04/2023   BILITOT 0.4 03/04/2023   ALKPHOS 62 03/04/2023   AST 17 03/04/2023   ALT 16 03/04/2023   ANIONGAP 11 03/04/2023    GFR: Estimated Creatinine Clearance: 96.9 mL/min (by C-G formula based on SCr of 0.96 mg/dL).  No results found for this or any previous visit (from the past 240 hour(s)).    Radiology Studies: No  results found.    LOS: 92 days    Joycelyn Das, MD [facility] 03/09/2023, 1:13 PM  If 7PM-7AM, please contact night-coverage [email or web address]

## 2023-03-09 NOTE — Progress Notes (Signed)
Refused am lab draw

## 2023-03-09 NOTE — Plan of Care (Signed)
  Problem: Fluid Volume: Goal: Hemodynamic stability will improve Outcome: Progressing   Problem: Activity: Goal: Risk for activity intolerance will decrease Outcome: Progressing   Problem: Nutrition: Goal: Adequate nutrition will be maintained Outcome: Progressing   

## 2023-03-09 NOTE — Progress Notes (Signed)
Refused am vitals and protonix.

## 2023-03-09 NOTE — Plan of Care (Signed)
  Problem: Fluid Volume: Goal: Hemodynamic stability will improve Outcome: Progressing   Problem: Activity: Goal: Risk for activity intolerance will decrease Outcome: Progressing   Problem: Nutrition: Goal: Adequate nutrition will be maintained Outcome: Progressing   Problem: Coping: Goal: Level of anxiety will decrease Outcome: Progressing   Problem: Elimination: Goal: Will not experience complications related to bowel motility Outcome: Progressing Goal: Will not experience complications related to urinary retention Outcome: Progressing   Problem: Pain Managment: Goal: General experience of comfort will improve Outcome: Progressing   Problem: Safety: Goal: Ability to remain free from injury will improve Outcome: Progressing

## 2023-03-10 DIAGNOSIS — L0390 Cellulitis, unspecified: Secondary | ICD-10-CM | POA: Diagnosis not present

## 2023-03-10 DIAGNOSIS — I96 Gangrene, not elsewhere classified: Secondary | ICD-10-CM | POA: Diagnosis not present

## 2023-03-10 DIAGNOSIS — J449 Chronic obstructive pulmonary disease, unspecified: Secondary | ICD-10-CM | POA: Diagnosis not present

## 2023-03-10 DIAGNOSIS — I5040 Unspecified combined systolic (congestive) and diastolic (congestive) heart failure: Secondary | ICD-10-CM | POA: Diagnosis not present

## 2023-03-10 MED ORDER — ALBUTEROL SULFATE (2.5 MG/3ML) 0.083% IN NEBU: 2.5000 mg | INHALATION_SOLUTION | Freq: Four times a day (QID) | RESPIRATORY_TRACT | Status: DC | PRN

## 2023-03-10 NOTE — Progress Notes (Signed)
PROGRESS NOTE    [name]  LZJ:[id number] DOB: [birth year] DOA: 12/07/2022 PCP: [name], NP   Brief Narrative: [name] is a 57 y.o. male with a history of heart failure, CAD, right BKA, hyperlipidemia, polyneuropathy, schizoaffective/bipolar disorder, COPD was admitted to the l with left lower extremity cellulitis and gangrene.  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  Currently stable.  Hospital stay prolonged due to difficulty in placement due to homelessness status status post bilateral BKA.[name]  Assessment and Plan:  Sepsis secondary to cellulitis/gangrene of left foot Initially received vancomycin, Cefepime and Flagyl.  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  Previous history of right below-knee amputation.  Currently unsafe for disposition.  Chronic combined systolic and diastolic heart failure Appears compensated.  Continue Lasix, Coreg, hydralazine, Imdur, on room air.  Essential hypertension -Continue Coreg, amlodipine, Imdur.  Blood pressure is stable.  COPD Compensated. Continue Dulera BID and albuterol.  Patient wishes nebulizers and thinks that inhalers are not working well.  Will change to nebulizer for now.  Anxiety -Continue Atarax PRN  GERD -Continue Protonix  Constipation -MiraLAX , Dulcolax PRN.  Denies acute disease.  Chronic pain On MS Contin 30 mg BID and oxycodone 20 every 4 hourly.  Will need direct supervision while taking pills since there was some concerns for hoarding the pills.  Schizoaffective disorder Seen by psychiatry during hospitalization.  Has been started on Abilify and Lexapro.  Will continue for now  Oral thrush.  Continue nystatin swish and swallow.  Has remained stable  DVT prophylaxis: Lovenox subcu  Code Status:   Code Status: Full Code  Family Communication:  None at bedside.  Disposition Plan:  Discharge pending TOC  recommendations. Medically stable for discharge.  Difficult disposition.   Consultants:  Orthopedic surgery Psychiatry  Procedures:  12/10/22: Left BKA  Antimicrobials: None currently.  Subjective: Today, patient was seen and examined at bedside.  States that he feels short of breath and wishes nebulizer instead of inhaler.  Denies any pain, nausea, vomiting, fever or chills.  Objective: BP 133/88 (BP Location: Left Arm)   Pulse 62   Temp 98.3 F (36.8 C)   Resp 18   Ht 5\' 9"  (1.753 m)   Wt 88.6 kg   SpO2 99%   BMI 28.84 kg/m   Examination: Body mass index is 28.84 kg/m.   General:  Average built, not in obvious distress, on a wheelchair.   HENT:   No scleral pallor or icterus noted.  Oral mucosa with mild discoloration, poor gingival hygiene.   Chest:  Clear breath sounds.  No crackles or wheezes.  CVS: S1 &S2 heard. No murmur.  Regular rate and rhythm. Abdomen: Soft, nontender, nondistended.  Bowel sounds are heard.   Extremities: Bilateral below-knee amputation. Psych: Alert, awake and oriented, normal mood CNS:  No cranial nerve deficits.  Skin: Warm and dry. Bilateral below-knee amputation.   Data Reviewed: I have personally reviewed the following labs and imaging studies.    CBC Lab Results  Component Value Date   WBC 5.6 03/04/2023   RBC 5.08 03/04/2023   HGB 15.5 03/04/2023   HCT 44.1 03/04/2023   MCV 86.8 03/04/2023   MCH 30.5 03/04/2023   PLT 309 03/04/2023   MCHC 35.1 03/04/2023   RDW 14.7 03/04/2023   LYMPHSABS 2.1 12/27/2022   MONOABS 0.7 12/27/2022   EOSABS 0.1 12/27/2022   BASOSABS 0.1 12/27/2022  Last metabolic panel Lab Results  Component Value Date   NA 138 03/04/2023   K 3.6 03/04/2023   CL 103 03/04/2023   CO2 24 03/04/2023   BUN 16 03/04/2023   CREATININE 0.96 03/04/2023   GLUCOSE 127 (H) 03/04/2023   GFRNONAA >60 03/04/2023   GFRAA >60 08/21/2020   CALCIUM 9.3 03/04/2023   PHOS 4.7 (H) 07/18/2022   PROT 7.4  03/04/2023   ALBUMIN 3.5 03/04/2023   BILITOT 0.4 03/04/2023   ALKPHOS 62 03/04/2023   AST 17 03/04/2023   ALT 16 03/04/2023   ANIONGAP 11 03/04/2023    GFR: Estimated Creatinine Clearance: 96.9 mL/min (by C-G formula based on SCr of 0.96 mg/dL).  No results found for this or any previous visit (from the past 240 hour(s)).    Radiology Studies: No results found.    LOS: 93 days    Joycelyn Das, MD [facility] 03/10/2023, 10:30 AM If 7PM-7AM, please contact night-coverage [email or web address]

## 2023-03-10 NOTE — Progress Notes (Signed)
Physical Therapy Treatment Patient Details [name]: [name] MRN: [id number] DOB: Apr 06, 1966 Today's Date: 03/10/2023   History of Present Illness Pt is a 57 y/o M who presented 12/17/22 with L ankle cellulitis and underwent L BKA on 1/10. PMH includes asthma, bioplar 1, depression, schizoaffective disorder, CVA, chronic CHF, homelessness, HTN, DM2, cocaine and cannabis use, and R AKA.    PT Comments    Continuing work on functional mobility and activity tolerance;  session focused on therex for prep for prostheses, with particular focus on modifying therex to be more accessible to [name]; modified bolstered bridging to be with [facility] elevated for better breathing; Difficulty tolerating prone hip extension, so modified to sidelying hip extension; noteworthy that while [name] was able to complete R hip extension in L sidelying, he did indicate that L sidelying is less comfortable -- will see how L sidelying therex is over next few sessions, and modifying if needed; Plan to post new sidelying exercises on [name]'s wall tomorrow;   [name] part of session focused on wheelchair to floor to wheelchair transfers, and was mostly discussion to give [name] rationale why the skill is important, and to let [name] know what it looks like to break down this (somewhat daunting) task into smaller parts to work on; Will consider using chair push-up/seated tricep extension-type movement to push up backwards onto a step next session ( I anticipate doing this in the bed onto a step/stool);   [name] seemed concerned that some program evaluators are noting that he has less participation with PT; in discussing this concern, he gave multple examples of what he can do now that he learned for acute PT and OT (one example: ant-post transfer onto tiolet); While looking back at PT and OT notes does show quite variable participation, it is worth considering that occasionally [name] can get very focused on one aspect of his care, and  experience overwhelm when we are unable to meet his concerns with solid answers (esp here in the acute hospital setting); sometimes the timing isn't right; but here recently he has been participating, and we have been working on skills that are often worked on in a rehab setting, or an outpatient PT setting.    Recommendations for follow up therapy are one component of a multi-disciplinary discharge planning process, led by the attending physician.  Recommendations may be updated based on patient status, additional functional criteria and insurance authorization.  Follow Up Recommendations   Consider Outpt PT for prosthesis training    Assistance Recommended at Discharge PRN  Patient can return home with the following Assist for transportation   Equipment Recommendations  Other (comment) (Consider sliding board)    Recommendations for Other Services       Precautions / Restrictions Precautions Precautions: Fall Precaution Comments: R AKA, L BKA     Mobility  Bed Mobility               General bed mobility comments: moves into and out of supine, R sidelying, and L sidelying with ease; occasional breath-holding/valsalva when getting ot sitting up via sit-up-like movement, but cosnsitently modified independent Patient Response: Cooperative  Transfers     Transfers: Bed to chair/wheelchair/BSC         Anterior-Posterior transfers: Supervision   General transfer comment: [name] performed an ant-post transfer wheelchair to bed;  with wheelchair beside bed, he moved both armrests, turned his body to with his back towards teh bed, and moved backwards onto bed; wheelchair brakes were still locked, and  while in teh bed, [name] moved his wheelchair around the foot of the bed (with lateral, reciprocal shifts from one wheel to the other) to the other side of the bed (where he likes to ant-post transfer back into wheelchair)    Ambulation/Gait                   Investment banker, corporate mobility: Yes Wheelchair propulsion: Both upper extremities Wheelchair parts: Independent Distance: hallways and managing around his room Wheelchair Assistance Details (indicate cue type and reason): Spends lots of time in the wc throughout the day; cruising hallways  Modified Rankin (Stroke Patients Only)       Balance     Sitting balance-Leahy Scale: Normal                                      Cognition Arousal/Alertness: Awake/alert Behavior During Therapy: WFL for tasks assessed/performed Overall Cognitive Status: Within Functional Limits for tasks assessed                                 General Comments: While [name] tends to ask what therapist suggests for session, he very much needs to "buy in" to tasks activities in order to participate        Exercises Amputee Exercises Hip Extension: AROM, Right, Left, 10 reps, Sidelying, Other (comment) (with cues for form and manual over-pressure/stretching into hip extension by this PT) Knee Extension: AROM, Right, Left, 10 reps, Seated (alternating) Other Exercises Other Exercises: Sidelying hip abduction into modified side plank x5; in R and L sidelying; Multimodal cues for form    General Comments General comments (skin integrity, edema, etc.): [name] seemed concerned that some program evaluators are noting that he has less participation with PT; in discussing this concern, he gave multple examples of what he can do now that he learned for acute PT and OT (one example: ant-post transfer onto tiolet)      Pertinent Vitals/Pain Pain Assessment Pain Assessment: Faces Faces Pain Scale: Hurts a little bit Pain Location: Grimace with therex; seemed to subside quickly when exercises done Pain Descriptors / Indicators: Grimacing Pain Intervention(s): Monitored during session    Home Living                          Prior  Function            PT Goals (current goals can now be found in the care plan section) Acute Rehab PT Goals Patient Stated Goal: Prepare for prosthetics fitting PT Goal Formulation: With patient Time For Goal Achievement: 03/18/23 Potential to Achieve Goals: Good Progress towards PT goals: Progressing toward goals    Frequency    Min 1X/week      PT Plan  (TOC working on best venue for DC; Noted [name] asking for STR at SNF; Pt has been undergoing rehab acutely for quite some time; has shown greater participation more recently as he focuses on the goal of getting prostheses)    Co-evaluation              AM-PAC PT "6 Clicks" Mobility   Outcome Measure  Help needed turning from your back to your side while in a flat bed without  using bedrails?: None Help needed moving from lying on your back to sitting on the side of a flat bed without using bedrails?: None Help needed moving to and from a bed to a chair (including a wheelchair)?: None Help needed standing up from a chair using your arms (e.g., wheelchair or bedside chair)?: Total Help needed to walk in hospital room?: Total Help needed climbing 3-5 steps with a railing? : Total 6 Click Score: 15    End of Session   Activity Tolerance: Patient tolerated treatment well Patient left: Other (comment) (in wheelchair cruising halls) Nurse Communication: Mobility status PT Visit Diagnosis: Muscle weakness (generalized) (M62.81);Other abnormalities of gait and mobility (R26.89)     Time: [id number] PT Time Calculation (min) (ACUTE ONLY): 44 min  Charges:  $Therapeutic Exercise: 8-22 mins $Therapeutic Activity: 8-22 mins $Self Care/Home Management: 8-22                     Van ClinesHolly Channel Papandrea, PT  Acute Rehabilitation Services Office [phone]    Levi AlandHolly H Keysean Savino 03/10/2023, 6:31 PM

## 2023-03-10 NOTE — Plan of Care (Signed)
  Problem: Fluid Volume: Goal: Hemodynamic stability will improve Outcome: Progressing   Problem: Activity: Goal: Risk for activity intolerance will decrease Outcome: Progressing   Problem: Nutrition: Goal: Adequate nutrition will be maintained Outcome: Progressing   

## 2023-03-10 NOTE — Progress Notes (Signed)
CSW spoke with Baxter Hire, occupational therapist at [facility] to discuss patient. Baxter Hire states she had just spoken with patient via phone and the two agreed she should come to the hospital do to an assessment on him. Baxter Hire states she plans to visit the patient on Thursday 03/12/23. Baxter Hire and CSW discussed barriers to placement and discharge.  CSW sent updated clinical notes to Aggie Cosier, Charity fundraiser at [facility] for review.  Edwin Dada, MSW, [name] Transitions of Care  Clinical Social Worker II [phone]

## 2023-03-10 NOTE — Plan of Care (Signed)

## 2023-03-11 DIAGNOSIS — I5040 Unspecified combined systolic (congestive) and diastolic (congestive) heart failure: Secondary | ICD-10-CM | POA: Diagnosis not present

## 2023-03-11 DIAGNOSIS — L0390 Cellulitis, unspecified: Secondary | ICD-10-CM | POA: Diagnosis not present

## 2023-03-11 DIAGNOSIS — J449 Chronic obstructive pulmonary disease, unspecified: Secondary | ICD-10-CM | POA: Diagnosis not present

## 2023-03-11 DIAGNOSIS — I96 Gangrene, not elsewhere classified: Secondary | ICD-10-CM | POA: Diagnosis not present

## 2023-03-11 LAB — CBC
HCT: 45.6 % (ref 39.0–52.0)
Hemoglobin: 15.2 g/dL (ref 13.0–17.0)
MCH: 29.8 pg (ref 26.0–34.0)
MCHC: 33.3 g/dL (ref 30.0–36.0)
MCV: 89.4 fL (ref 80.0–100.0)
Platelets: 277 10*3/uL (ref 150–400)
RBC: 5.1 MIL/uL (ref 4.22–5.81)
RDW: 15 % (ref 11.5–15.5)
WBC: 7 10*3/uL (ref 4.0–10.5)
nRBC: 0 % (ref 0.0–0.2)

## 2023-03-11 LAB — BASIC METABOLIC PANEL
Anion gap: 6 (ref 5–15)
BUN: 16 mg/dL (ref 6–20)
CO2: 25 mmol/L (ref 22–32)
Calcium: 9.2 mg/dL (ref 8.9–10.3)
Chloride: 106 mmol/L (ref 98–111)
Creatinine, Ser: 0.93 mg/dL (ref 0.61–1.24)
GFR, Estimated: >60 mL/min (ref >60)
Glucose, Bld: 88 mg/dL (ref 70–99)
Potassium: 4.1 mmol/L (ref 3.5–5.1)
Sodium: 137 mmol/L (ref 135–145)

## 2023-03-11 LAB — MAGNESIUM: Magnesium: 2 mg/dL (ref 1.7–2.4)

## 2023-03-11 MED ORDER — ALBUTEROL SULFATE (2.5 MG/3ML) 0.083% IN NEBU
2.5000 mg | INHALATION_SOLUTION | Freq: Four times a day (QID) | RESPIRATORY_TRACT | Status: DC | PRN
  Administered 2023-03-16: 2.5 mg via RESPIRATORY_TRACT
  Filled 2023-03-11: qty 3

## 2023-03-11 MED ORDER — ALBUTEROL SULFATE (2.5 MG/3ML) 0.083% IN NEBU
2.5000 mg | INHALATION_SOLUTION | Freq: Four times a day (QID) | RESPIRATORY_TRACT | Status: DC
  Administered 2023-03-11: 2.5 mg via RESPIRATORY_TRACT
  Filled 2023-03-11 (×2): qty 3

## 2023-03-11 NOTE — Progress Notes (Signed)
PROGRESS NOTE    [name]  PYK:[id number] DOB: 10-May-1966 DOA: 12/07/2022 PCP: [name], NP   Brief Narrative: [name] is a 57 y.o. male with a history of heart failure, CAD, right BKA, hyperlipidemia, polyneuropathy, schizoaffective/bipolar disorder, COPD was admitted to the l with left lower extremity cellulitis and gangrene.  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  Currently stable.  Hospital stay prolonged due to difficulty in placement due to homelessness status status post bilateral BKA.Marland Kitchen  Assessment and Plan:  Sepsis secondary to cellulitis/gangrene of left foot Initially received vancomycin, Cefepime and Flagyl.  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  Previous history of right below-knee amputation.  Currently unsafe for disposition.  Chronic combined systolic and diastolic heart failure Compensated.  Continue Lasix, Coreg, hydralazine, Imdur, on room air.  Essential hypertension -Continue Coreg, amlodipine, Imdur.  Blood pressure is slightly high today.  Will continue to monitor.  COPD Compensated. Continue Dulera BID and albuterol.  Patient wishes nebulizers and thinks that inhalers are not working well.  Will change to nebulizer for now and wishes to be scheduled..  Anxiety -Continue Atarax PRN  GERD -Continue Protonix  Constipation -MiraLAX , Dulcolax PRN.  Denies acute disease.  Chronic pain On MS Contin 30 mg BID and oxycodone 20 milligram every 4 hourly.  Will need direct supervision while taking pills since there was some concerns for hoarding the pills.  Schizoaffective disorder Seen by psychiatry during hospitalization.  Continue Abilify and Lexapro.    Oral thrush.  Continue nystatin swish and swallow.  Slightly improved  DVT prophylaxis: Lovenox subcu  Code Status:   Code Status: Full Code  Family Communication:  None at bedside.  Disposition Plan:   Discharge pending TOC recommendations. Medically stable for discharge.  Difficult disposition.   Consultants:  Orthopedic surgery Psychiatry  Procedures:  12/10/22: Left BKA  Antimicrobials: None currently.  Subjective: Today, patient was seen and examined at bedside.  Denies any pain, nausea, vomiting, fever or chills.  Wishes to have the scheduled nebulizers.  Objective: BP (!) 150/84   Pulse 63   Temp (!) 97.3 F (36.3 C) (Oral)   Resp 20   Ht 5\' 9"  (1.753 m)   Wt 88.6 kg   SpO2 96%   BMI 28.84 kg/m   Examination: Body mass index is 28.84 kg/m.   General: Alert awake and Communicative, not in obvious distress, wheeling on a wheelchair. HENT:   No scleral pallor or icterus noted.  Mild oral mucosal discoloration.   Chest:  Clear breath sounds.  No crackles or wheezes.  CVS: S1 &S2 heard. No murmur.  Regular rate and rhythm. Abdomen: Soft, nontender, nondistended.  Bowel sounds are heard.   Extremities: bilateral below-knee amputation. Psych: Alert, awake and oriented, normal mood CNS:  No cranial nerve deficits.  Skin: Warm and dry. Bilateral below-knee amputation.   Data Reviewed: I have personally reviewed the following labs and imaging studies.    CBC Lab Results  Component Value Date   WBC 7.0 03/11/2023   RBC 5.10 03/11/2023   HGB 15.2 03/11/2023   HCT 45.6 03/11/2023   MCV 89.4 03/11/2023   MCH 29.8 03/11/2023   PLT 277 03/11/2023   MCHC 33.3 03/11/2023   RDW 15.0 03/11/2023   LYMPHSABS 2.1 12/27/2022   MONOABS 0.7 12/27/2022   EOSABS 0.1 12/27/2022   BASOSABS 0.1 12/27/2022     Last metabolic panel Lab Results  Component  Value Date   NA 137 03/11/2023   K 4.1 03/11/2023   CL 106 03/11/2023   CO2 25 03/11/2023   BUN 16 03/11/2023   CREATININE 0.93 03/11/2023   GLUCOSE 88 03/11/2023   GFRNONAA >60 03/11/2023   GFRAA >60 08/21/2020   CALCIUM 9.2 03/11/2023   PHOS 4.7 (H) 07/18/2022   PROT 7.4 03/04/2023   ALBUMIN 3.5 03/04/2023    BILITOT 0.4 03/04/2023   ALKPHOS 62 03/04/2023   AST 17 03/04/2023   ALT 16 03/04/2023   ANIONGAP 6 03/11/2023    GFR: Estimated Creatinine Clearance: 100.1 mL/min (by C-G formula based on SCr of 0.93 mg/dL).  No results found for this or any previous visit (from the past 240 hour(s)).    Radiology Studies: No results found.    LOS: 94 days    Joycelyn Das, MD [facility] 03/11/2023, 11:54 AM If 7PM-7AM, please contact night-coverage [email or web address]

## 2023-03-11 NOTE — Plan of Care (Signed)

## 2023-03-11 NOTE — Plan of Care (Signed)
  Problem: Activity: Goal: Risk for activity intolerance will decrease Outcome: Progressing   Problem: Nutrition: Goal: Adequate nutrition will be maintained Outcome: Progressing   Problem: Coping: Goal: Level of anxiety will decrease Outcome: Progressing   

## 2023-03-12 DIAGNOSIS — I96 Gangrene, not elsewhere classified: Secondary | ICD-10-CM | POA: Diagnosis not present

## 2023-03-12 NOTE — Progress Notes (Addendum)
1pm: CSW participated in the interdisciplinary team meeting with [facility] staff, [place] ([address] Health Team Leader at [place] [facility] [place]), and [place], [place] PT, and patient.  Current plan is for [facility] staff to locate a permanent housing option in [place]. [name] staff will make referral for an ACT team and will obtain a Life Alert device for patient. Once stable housing is secured, CSW will apply for PCS services and obtain a home health agency to assist patient with his transition back into the community.  8:15am: CSW will participate in an interdisciplinary team meeting today with [facility] staff and [place] from the [place] [facility] [place] today at 12pm via WebEx.  Edwin Dada, MSW, [name] Transitions of Care  Clinical Social Worker II [phone]

## 2023-03-12 NOTE — Progress Notes (Signed)
PROGRESS NOTE    [name]  LZJ:[id number] DOB: 1966-06-23 DOA: 12/07/2022 PCP: [name], NP   Brief Narrative: [name] is a 57 y.o. 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 Hospitalization complicated by difficulty to placed.   Subjective: Seen and examined, Overnight vitals/labs/events reviewed  Overnight afebrile BP running 130s to 140s Labs from 4/10 normal CBC BMP  Assessment and Plan:  Sepsis 2/2 cellulitis/gangrene of left foot:Initially received borad spectrum antibiotics.S/P 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Doing well.Currently unsafe for disposition.  Chronic combined systolic and diastolic heart failure: Compensated euvolemic continue home Lasix Coreg hydralazine Imdur.  Essential hypertension:Well-controlled on Coreg, amlodipine, Imdur.   COPD-not wheezing.Continue Dulera BID and albuterol. .  Anxiety:Mood is stable, continue Atarax PRN  GERD: Continue Protonix  Constipation cont MiraLAX , Dulcolax PRN.  Chronic pain on MS Contin 30 mg BID and oxycodone 20 milligram every 4 hourly. Will need direct supervision while taking pills since there was some concerns for hoarding the pills.  Schizoaffective disorder:Seen by psychiatry during hospitalization.  Continue Abilify and Lexapro.    Oral thrush.  Continue nystatin swish and swallow.   DVT prophylaxis: enoxaparin (LOVENOX) injection 40 mg Start: 02/25/23 2200 SCD's Start: 12/10/22 1731  Code Status:   Code Status: Full Code Family Communication: None at bedside.  Disposition Plan: Discharge pending TOC recommendations. Medically stable for discharge.  Difficult  disposition. Needs SNF   Consultants:  Orthopedic surgery Psychiatry  Procedures:  12/10/22: Left BKA  Antimicrobials: None currently.  Objective: BP 135/73 (BP Location: Right Arm)   Pulse 60   Temp 97.7 F (36.5 C)   Resp 20   Ht 5\' 9"  (1.753 m)   Wt 88.6 kg   SpO2 99%   BMI 28.84 kg/m   Examination: Body mass index is 28.84 kg/m.  General exam: AAox3, weak,older appearing HEENT:Oral mucosa moist, Ear/Nose WNL grossly, dentition normal. Respiratory system: bilaterally clear BS, no use of accessory muscle Cardiovascular system: S1 & S2 +, regular rate,. Gastrointestinal system: Abdomen soft, NT,ND,BS+ Nervous System:Alert, awake, moving extremities and grossly nonfocal Extremities: extremities warm Skin: No rashes,no icterus. MSK: left leg w/ dressing intact Data Reviewed: I have personally reviewed the following labs and imaging studies.   CBC Lab Results  Component Value Date   WBC 7.0 03/11/2023   RBC 5.10 03/11/2023   HGB 15.2 03/11/2023   HCT 45.6 03/11/2023   MCV 89.4 03/11/2023   MCH 29.8 03/11/2023   PLT 277 03/11/2023   MCHC 33.3 03/11/2023   RDW 15.0 03/11/2023   LYMPHSABS 2.1 12/27/2022   MONOABS 0.7 12/27/2022   EOSABS 0.1 12/27/2022   BASOSABS 0.1 12/27/2022  Last metabolic panel Lab Results  Component Value Date   NA 137 03/11/2023   K 4.1 03/11/2023   CL 106 03/11/2023   CO2 25 03/11/2023   BUN 16 03/11/2023   CREATININE 0.93 03/11/2023   GLUCOSE 88 03/11/2023   GFRNONAA >60 03/11/2023   GFRAA >60 08/21/2020   CALCIUM 9.2 03/11/2023   PHOS 4.7 (H) 07/18/2022   PROT 7.4 03/04/2023   ALBUMIN 3.5 03/04/2023   BILITOT 0.4 03/04/2023   ALKPHOS 62 03/04/2023   AST 17 03/04/2023   ALT 16 03/04/2023   ANIONGAP 6 03/11/2023  GFR: Estimated Creatinine Clearance:  100.1 mL/min (by C-G formula based on SCr of 0.93 mg/dL). No results found for this or any previous visit (from the past 240 hour(s)).   Radiology Studies: No results  found.  LOS: 95 days   Lanae Boast MD [facility] 03/12/2023, 8:25 AM If 7PM-7AM, please contact night-coverage [email or web address]

## 2023-03-12 NOTE — Plan of Care (Signed)
  Problem: Fluid Volume: Goal: Hemodynamic stability will improve Outcome: Progressing   Problem: Activity: Goal: Risk for activity intolerance will decrease Outcome: Progressing   Problem: Nutrition: Goal: Adequate nutrition will be maintained Outcome: Progressing   Problem: Coping: Goal: Level of anxiety will decrease Outcome: Progressing   Problem: Elimination: Goal: Will not experience complications related to bowel motility Outcome: Progressing Goal: Will not experience complications related to urinary retention Outcome: Progressing   Problem: Pain Managment: Goal: General experience of comfort will improve Outcome: Progressing   Problem: Safety: Goal: Ability to remain free from injury will improve Outcome: Progressing   Problem: Skin Integrity: Goal: Risk for impaired skin integrity will decrease Outcome: Progressing   Problem: Education: Goal: Knowledge of the prescribed therapeutic regimen will improve Outcome: Progressing Goal: Ability to verbalize activity precautions or restrictions will improve Outcome: Progressing Goal: Understanding of discharge needs will improve Outcome: Progressing   Problem: Activity: Goal: Ability to perform//tolerate increased activity and mobilize with assistive devices will improve Outcome: Progressing   Problem: Clinical Measurements: Goal: Postoperative complications will be avoided or minimized Outcome: Progressing   Problem: Self-Care: Goal: Ability to meet self-care needs will improve Outcome: Progressing   Problem: Self-Concept: Goal: Ability to maintain and perform role responsibilities to the fullest extent possible will improve Outcome: Progressing   Problem: Pain Management: Goal: Pain level will decrease with appropriate interventions Outcome: Progressing   Problem: Skin Integrity: Goal: Demonstration of wound healing without infection will improve Outcome: Progressing   Problem: Education: Goal:  Knowledge of General Education information will improve Description: Including pain rating scale, medication(s)/side effects and non-pharmacologic comfort measures Outcome: Progressing   Problem: Health Behavior/Discharge Planning: Goal: Ability to manage health-related needs will improve Outcome: Progressing   Problem: Clinical Measurements: Goal: Ability to maintain clinical measurements within normal limits will improve Outcome: Progressing Goal: Will remain free from infection Outcome: Progressing Goal: Diagnostic test results will improve Outcome: Progressing Goal: Respiratory complications will improve Outcome: Progressing Goal: Cardiovascular complication will be avoided Outcome: Progressing   Problem: Activity: Goal: Risk for activity intolerance will decrease Outcome: Progressing   Problem: Nutrition: Goal: Adequate nutrition will be maintained Outcome: Progressing   Problem: Coping: Goal: Level of anxiety will decrease Outcome: Progressing   Problem: Elimination: Goal: Will not experience complications related to bowel motility Outcome: Progressing Goal: Will not experience complications related to urinary retention Outcome: Progressing   Problem: Pain Managment: Goal: General experience of comfort will improve Outcome: Progressing   Problem: Safety: Goal: Ability to remain free from injury will improve Outcome: Progressing   Problem: Skin Integrity: Goal: Risk for impaired skin integrity will decrease Outcome: Progressing

## 2023-03-13 DIAGNOSIS — I96 Gangrene, not elsewhere classified: Secondary | ICD-10-CM | POA: Diagnosis not present

## 2023-03-13 MED ORDER — LEVALBUTEROL HCL 0.63 MG/3ML IN NEBU
0.6300 mg | INHALATION_SOLUTION | Freq: Two times a day (BID) | RESPIRATORY_TRACT | Status: DC
  Administered 2023-03-14 – 2023-03-21 (×7): 0.63 mg via RESPIRATORY_TRACT
  Filled 2023-03-13 (×17): qty 3

## 2023-03-13 NOTE — Progress Notes (Signed)
PROGRESS NOTE    [name]  OIP:[id number] DOB: 1966/01/17 DOA: 12/07/2022 PCP: [name], NP   Brief Narrative: [name] is a 57 y.o. 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 Hospitalization complicated by difficulty to placed.   Subjective: Patient seen and examined.   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  Chronic combined systolic and diastolic heart failure: Remains euvolemic and compensated.  continue home Lasix Coreg hydralazine Imdur.  Essential hypertension:Well-controlled on Coreg, amlodipine, Imdur.   COPD-not wheezing.Continue Dulera BID and albuterol.  Requesting bronchodilator treatment x 2  Anxiety:Mood is stable, continue Atarax PRN  GERD: Continue Protonix  Constipation cont MiraLAX , Dulcolax PRN.  Chronic pain on MS Contin 30 mg BID and oxycodone 20 milligram every 4 hourly. Will need direct supervision while taking pills since there was some concerns for hoarding the pills.  Schizoaffective disorder:Seen by psychiatry during hospitalization.  Continue Abilify and Lexapro.    Oral thrush.  Continue nystatin swish and swallow.   DVT prophylaxis: enoxaparin (LOVENOX) injection 40 mg Start: 02/25/23 2200 SCD's Start: 12/10/22 1731  Code Status:   Code Status: Full Code Family Communication: None at bedside.  Disposition Plan: Discharge pending TOC recommendations. Medically  stable for discharge.  Difficult disposition. Needs SNF   Consultants:  Orthopedic surgery Psychiatry  Procedures:  12/10/22: Left BKA  Antimicrobials: None currently.  Objective: BP (!) 145/81 (BP Location: Left Arm) Comment: nurse notified  Pulse 71   Temp 98 F (36.7 C) (Oral)   Resp 18   Ht 5\' 9"  (1.753 m)   Wt 88.6 kg   SpO2 98%   BMI 28.84 kg/m   Examination: General exam: AA oriented x 3, weak,older appearing HEENT:Oral mucosa moist, Ear/Nose WNL grossly, dentition normal. Respiratory system: bilaterally clear BS, no use of accessory muscle Cardiovascular system: S1 & S2 +, regular rate. Gastrointestinal system: Abdomen soft, NT,ND,BS+ Nervous System:Alert, awake, moving extremities and grossly nonfocal Extremities: Lt  BKA Skin: No rashes,no icterus. MSK: Normal muscle bulk,tone, power   Medications reviewed:  Scheduled Meds:  amLODipine  10 mg Oral Daily   ARIPiprazole  5 mg Oral Daily   vitamin C  1,000 mg Oral Daily   carvedilol  3.125 mg Oral BID WC   collagenase   Topical Daily   enoxaparin (LOVENOX) injection  40 mg Subcutaneous Q24H   escitalopram  5 mg Oral Daily   furosemide  40 mg Oral Daily   gabapentin  900 mg Oral TID   isosorbide mononitrate  15 mg Oral Daily   melatonin  3 mg Oral QHS   mometasone-formoterol  2 puff Inhalation BID   morphine  30 mg Oral Q12H   multivitamin with minerals  1 tablet Oral Daily   nystatin  5 mL Oral QID   pantoprazole  40 mg Oral Daily   thiamine  100 mg Oral Daily   Continuous Infusions:  magnesium sulfate bolus IVPB      Diet Order             Diet regular Room  service appropriate? Yes; Fluid consistency: Thin  Diet effective now                  Intake/Output Summary (Last 24 hours) at 03/13/2023 1034 Last data filed at 03/13/2023 0900 Gross per 24 hour  Intake 480 ml  Output 600 ml  Net -120 ml   Net IO Since Admission: -43,700.28 mL [03/13/23 1034]  Wt Readings from Last 3 Encounters:   02/28/23 88.6 kg  12/03/22 83.1 kg  11/03/22 79 kg   Unresulted Labs (From admission, onward)     Start     Ordered   03/04/23 0500  Creatinine, serum  (enoxaparin (LOVENOX)    CrCl >/= 30 ml/min)  Weekly,   R     Comments: while on enoxaparin therapy   Question:  Specimen collection method  Answer:  Lab=Lab collect   02/25/23 1637          Data Reviewed: I have personally reviewed following labs and imaging studies CBC: Recent Labs  Lab 03/11/23 0713  WBC 7.0  HGB 15.2  HCT 45.6  MCV 89.4  PLT 277   Basic Metabolic Panel: Recent Labs  Lab 03/11/23 0713  NA 137  K 4.1  CL 106  CO2 25  GLUCOSE 88  BUN 16  CREATININE 0.93  CALCIUM 9.2  MG 2.0  No results found for this or any previous visit (from the past 240 hour(s)).  Antimicrobials: Anti-infectives (From admission, onward)    Start     Dose/Rate Route Frequency Ordered Stop   12/26/22 2200  doxycycline (VIBRA-TABS) tablet 100 mg        100 mg Oral Every 12 hours 12/26/22 1307 01/08/23 2126   12/24/22 1000  doxycycline (VIBRA-TABS) tablet 100 mg  Status:  Discontinued        100 mg Oral Every 12 hours 12/24/22 0715 12/26/22 1307   12/10/22 1830  ceFAZolin (ANCEF) IVPB 2g/100 mL premix  Status:  Discontinued        2 g 200 mL/hr over 30 Minutes Intravenous Every 8 hours 12/10/22 1731 12/10/22 1740   12/10/22 1330  ceFAZolin (ANCEF) IVPB 2g/100 mL premix        2 g 200 mL/hr over 30 Minutes Intravenous On call to O.R. 12/10/22 0959 12/10/22 1534   12/08/22 0530  vancomycin (VANCOREADY) IVPB 1500 mg/300 mL        1,500 mg 150 mL/hr over 120 Minutes Intravenous Every 12 hours 12/07/22 1741 12/11/22 0955   12/08/22 0345  metroNIDAZOLE (FLAGYL) IVPB 500 mg        500 mg 100 mL/hr over 60 Minutes Intravenous Every 12 hours 12/07/22 1742 12/12/22 0344   12/07/22 2330  ceFEPIme (MAXIPIME) 2 g in sodium chloride 0.9 % 100 mL IVPB        2 g 200 mL/hr over 30 Minutes Intravenous Every 8 hours 12/07/22 1741  12/11/22 1529   12/07/22 2200  metroNIDAZOLE (FLAGYL) IVPB 500 mg  Status:  Discontinued        500 mg 100 mL/hr over 60 Minutes Intravenous Every 12 hours 12/07/22 2048 12/07/22 2056   12/07/22 1530  ceFEPIme (MAXIPIME) 2 g in sodium chloride 0.9 % 100 mL IVPB        2 g 200 mL/hr over 30 Minutes Intravenous  Once 12/07/22 1516 12/07/22 1734   12/07/22 1530  vancomycin (VANCOREADY) IVPB 1500 mg/300 mL        1,500 mg 150 mL/hr over 120 Minutes Intravenous  Once 12/07/22 1516 12/09/22 0333   12/07/22 1515  metroNIDAZOLE (FLAGYL) IVPB 500 mg        500 mg 100 mL/hr over 60 Minutes Intravenous  Once 12/07/22 1506 12/07/22 1734   12/07/22 1500  ceFAZolin (ANCEF) IVPB 1 g/50 mL premix  Status:  Discontinued        1 g 100 mL/hr over 30 Minutes Intravenous  Once 12/07/22 1452 12/07/22 1513      Culture/Microbiology    Component Value Date/Time   SDES  12/07/2022 1730    BLOOD RIGHT ANTECUBITAL Performed at [facility], [address] [place], [name] [address]    SDES  12/07/2022 1730    BLOOD SITE NOT SPECIFIED Performed at [facility], [address] [place], [name] [address]    SPECREQUEST  12/07/2022 1730    BOTTLES DRAWN AEROBIC AND ANAEROBIC Blood Culture adequate volume Performed at [facility], [address] [place], [name] [address]    SPECREQUEST  12/07/2022 1730    BOTTLES DRAWN AEROBIC ONLY Blood Culture results may not be optimal due to an excessive volume of blood received in culture bottles Performed at [facility], [address], [place], [name] [address]    CULT  12/07/2022 1730    NO GROWTH 5 DAYS Performed at [facility], [address] [place], [name] [address]    CULT  12/07/2022 1730    NO GROWTH 5 DAYS Performed at [facility], [address] [place], [name] [address]    REPTSTATUS 12/12/2022 FINAL 12/07/2022 1730   REPTSTATUS 12/12/2022 FINAL  12/07/2022 1730  Radiology Studies: No results found.   LOS: 96 days   Lanae Boast, MD [facility]  03/13/2023, 10:34 AM

## 2023-03-13 NOTE — Progress Notes (Signed)
This writer went into the pt room to provide cluster care (take vital signs and give scheduled medication), pt refused bot. Nurse asked the pt for a second time if he wanted his scheduled medications (scheduled morphine) pt refused, provider notified, will continue to monitor throughout the night.

## 2023-03-14 DIAGNOSIS — I96 Gangrene, not elsewhere classified: Secondary | ICD-10-CM | POA: Diagnosis not present

## 2023-03-14 NOTE — Plan of Care (Signed)
  Problem: Fluid Volume: Goal: Hemodynamic stability will improve Outcome: Progressing   Problem: Activity: Goal: Risk for activity intolerance will decrease Outcome: Progressing   Problem: Nutrition: Goal: Adequate nutrition will be maintained Outcome: Progressing   

## 2023-03-14 NOTE — Progress Notes (Signed)
PROGRESS NOTE    [name]  OYD:[id number] DOB: 1966/08/12 DOA: 12/07/2022 PCP: [name], NP   Brief Narrative:  [name] is a 57 y.o. 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 Hospitalization complicated by difficulty to placed.   Assessment & Plan:   Sepsis 2/2 cellulitis/gangrene of left foot: -Initially received broad spectrum antibiotics. 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 -Doing well.Currently unsafe for disposition.   Chronic combined systolic and diastolic heart failure:  -Remains euvolemic and compensated.   -continue home Lasix Coreg hydralazine Imdur.   Essential hypertension: -Well-controlled on Coreg, amlodipine, Imdur.    COPD- -not wheezing.Continue Dulera BID and albuterol.  Requesting for nebulizer treatment  Anxiety:Mood is stable -continue Atarax PRN   GERD: Continue Protonix   Constipation cont MiraLAX , Dulcolax PRN.   Chronic pain on MS Contin 30 mg BID and oxycodone 20 milligram every 4 hourly.  -Will need direct supervision while taking pills since there was some concerns for hoarding the pills.   Schizoaffective disorder: -Seen by psychiatry during hospitalization.  Continue Abilify and Lexapro.     Oral thrush.  Continue nystatin swish and swallow.   DVT prophylaxis: Lovenox Code Status: Full code Family Communication: Family present at bedside (sleeping).  Plan of care discussed with patient in length and he verbalized understanding and agreed with it. Disposition Plan:  SNF  Discharge pending TOC recommendations. Medically stable for discharge. Difficult disposition. Needs SNF   Consultants:   Orthopedic surgery Psychiatry  Procedures:  12/10/2022-left BKA  Status is: Inpatient   Subjective: Patient seen and examined. 2  family member sleeping in the room.  He denies any new complaints.  Requesting for nebulizer treatment.  He refused lung exam.  Objective: Vitals:   03/14/23 0440 03/14/23 0451 03/14/23 0838 03/14/23 0841  BP: (!) 148/99     Pulse: (!) 58     Resp:      Temp:      TempSrc:      SpO2: 100% 100% 98% 98%  Weight:      Height:        Intake/Output Summary (Last 24 hours) at 03/14/2023 0936 Last data filed at 03/13/2023 1709 Gross per 24 hour  Intake 480 ml  Output --  Net 480 ml   Filed Weights   02/23/23 0500 02/27/23 0743 02/28/23 0436  Weight: 87.2 kg 87.1 kg 88.6 kg    Examination:  General exam: Appears calm and comfortable, on room air, communicating well Respiratory system: Refused cardiovascular system: refused Gastrointestinal system: Refused Central nervous system: Alert and oriented. No focal neurological deficits. Extremities: Bilateral BKA   Data Reviewed: I have personally reviewed following labs and imaging studies  CBC: Recent Labs  Lab 03/11/23 0713  WBC 7.0  HGB 15.2  HCT 45.6  MCV 89.4  PLT 277   Basic Metabolic Panel: Recent Labs  Lab 03/11/23 0713  NA 137  K 4.1  CL 106  CO2 25  GLUCOSE 88  BUN 16  CREATININE 0.93  CALCIUM 9.2  MG 2.0   GFR: Estimated Creatinine Clearance: 100.1 mL/min (by C-G formula based on SCr of 0.93 mg/dL). Liver Function Tests: No results for input(s): "AST", "ALT", "ALKPHOS", "BILITOT", "PROT", "ALBUMIN" in the last 168 hours. No results for input(s): "  LIPASE", "AMYLASE" in the last 168 hours. No results for input(s): "AMMONIA" in the last 168 hours. Coagulation Profile: No results for input(s): "INR", "PROTIME" in the last 168 hours. Cardiac Enzymes: No results for input(s): "CKTOTAL", "CKMB", "CKMBINDEX", "TROPONINI" in the last 168 hours. BNP (last 3 results) No  results for input(s): "PROBNP" in the last 8760 hours. HbA1C: No results for input(s): "HGBA1C" in the last 72 hours. CBG: No results for input(s): "GLUCAP" in the last 168 hours. Lipid Profile: No results for input(s): "CHOL", "HDL", "LDLCALC", "TRIG", "CHOLHDL", "LDLDIRECT" in the last 72 hours. Thyroid Function Tests: No results for input(s): "TSH", "T4TOTAL", "FREET4", "T3FREE", "THYROIDAB" in the last 72 hours. Anemia Panel: No results for input(s): "VITAMINB12", "FOLATE", "FERRITIN", "TIBC", "IRON", "RETICCTPCT" in the last 72 hours. Sepsis Labs: No results for input(s): "PROCALCITON", "LATICACIDVEN" in the last 168 hours.  No results found for this or any previous visit (from the past 240 hour(s)).    Radiology Studies: No results found.  Scheduled Meds:  amLODipine  10 mg Oral Daily   ARIPiprazole  5 mg Oral Daily   vitamin C  1,000 mg Oral Daily   carvedilol  3.125 mg Oral BID WC   collagenase   Topical Daily   enoxaparin (LOVENOX) injection  40 mg Subcutaneous Q24H   escitalopram  5 mg Oral Daily   furosemide  40 mg Oral Daily   gabapentin  900 mg Oral TID   isosorbide mononitrate  15 mg Oral Daily   levalbuterol  0.63 mg Nebulization BID   melatonin  3 mg Oral QHS   mometasone-formoterol  2 puff Inhalation BID   morphine  30 mg Oral Q12H   multivitamin with minerals  1 tablet Oral Daily   nystatin  5 mL Oral QID   pantoprazole  40 mg Oral Daily   thiamine  100 mg Oral Daily   Continuous Infusions:  magnesium sulfate bolus IVPB       LOS: 97 days   Time spent: 30 minutes   Leigh Kaeding Estill Cotta, MD [facility]  If 7PM-7AM, please contact night-coverage [email or web address] 03/14/2023, 9:36 AM

## 2023-03-15 DIAGNOSIS — I96 Gangrene, not elsewhere classified: Secondary | ICD-10-CM | POA: Diagnosis not present

## 2023-03-15 NOTE — Progress Notes (Incomplete)
This RN went to patients room with day shift RN at shift change. This RN introduced self to patient and politely asked pt to please not leave the unit without staff tonight and not to go out the front doors of the hospital because they lock at 2000 and I do not want him to get locked outside. (Per day shift RN pt had left unit earlier in day without staff) Pt began yelling at RN asking why RN telling him this and he knows this. Pt refusing to let RN speak anymore and yelling when RN trying to talk and telling RNs to leave the room.  RN left and notified charge RN. Security called to bedside by charge RN. About 30-45 minutes later RN spoke with Engineer, agricultural via phone who reports she spoke to pt over the phone this evening after pt yelled at RN and reviewed rules about leaving the unit and about not yelling at staff. Per director, pt has 3 visitors who are allowed to stay the night tonight.   RN later went to patient's room around 2310 with evening meds but pt declined meds and kept stating "no I'm good" when RN offered morphine and other meds. RN asked if she could do physical assessment and pt said no. Noted all visitors who were present at start of shift gone at this time.   Night provider notified.

## 2023-03-15 NOTE — Progress Notes (Signed)
Patient ID: [name], male   DOB: 1966-04-09, 57 y.o.   MRN: [id number] Stumps healed . He has questions about prothesis which he can address with Dr. Lajoyce Corners at office follow up in several weeks.

## 2023-03-15 NOTE — Progress Notes (Signed)
PROGRESS NOTE    Demetrick Tirey  ASU:[id number] DOB: 1966/05/24 DOA: 12/07/2022 PCP: [name], NP   Brief Narrative:  Clayburn Garbett is a 57 y.o. 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 Hospitalization complicated by difficulty to place.   Assessment & Plan:   Sepsis 2/2 cellulitis/gangrene of left foot: -Initially received broad spectrum antibiotics. 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 -Doing well.Currently unsafe for disposition.   Chronic combined systolic and diastolic heart failure:  -Remains euvolemic and compensated.   -continue home Lasix Coreg hydralazine Imdur.   Essential hypertension: -Well-controlled on Coreg, amlodipine, Imdur.    COPD- -not wheezing.Continue Dulera BID and albuterol.  Requesting for nebulizer treatment  Anxiety:Mood is stable -continue Atarax PRN   GERD: Continue Protonix   Constipation cont MiraLAX , Dulcolax PRN.   Chronic pain on MS Contin 30 mg BID and oxycodone 20 milligram every 4 hourly.  -Will need direct supervision while taking pills since there was some concerns for hoarding the pills.   Schizoaffective disorder: -Seen by psychiatry during hospitalization.  Continue Abilify and Lexapro.     Oral thrush.  Continue nystatin swish and swallow.   DVT prophylaxis: Lovenox Code Status: Full code Family Communication: Family present at bedside  Disposition Plan:  SNF  Discharge pending TOC recommendations. Medically stable for discharge. Difficult disposition. Needs SNF   Consultants:  Orthopedic surgery Psychiatry  Procedures:  12/10/2022-left BKA  Status is: Inpatient   Subjective: Up  in wheelchair-- no overnight events  Objective: Vitals:   03/14/23 0838 03/14/23 0841 03/14/23 1700 03/14/23 1928  BP:   (!) 156/90 98/72  Pulse:   93 (!) 49  Resp:   18 18  Temp:   98.3 F (36.8 C) 97.8 F (36.6 C)  TempSrc:      SpO2: 98% 98% 98% 93%  Weight:      Height:        Intake/Output Summary (Last 24 hours) at 03/15/2023 0944 Last data filed at 03/15/2023 0700 Gross per 24 hour  Intake 720 ml  Output 500 ml  Net 220 ml   Filed Weights   02/23/23 0500 02/27/23 0743 02/28/23 0436  Weight: 87.2 kg 87.1 kg 88.6 kg    Examination:  In wheelchair, NAD Rrr No increased work of breathing Stumps healed    Data Reviewed: I have personally reviewed following labs and imaging studies  CBC: Recent Labs  Lab 03/11/23 0713  WBC 7.0  HGB 15.2  HCT 45.6  MCV 89.4  PLT 277   Basic Metabolic Panel: Recent Labs  Lab 03/11/23 0713  NA 137  K 4.1  CL 106  CO2 25  GLUCOSE 88  BUN 16  CREATININE 0.93  CALCIUM 9.2  MG 2.0   GFR: Estimated Creatinine Clearance: 100.1 mL/min (by C-G formula based on SCr of 0.93 mg/dL). Liver Function Tests: No results for input(s): "AST", "ALT", "ALKPHOS", "BILITOT", "PROT", "ALBUMIN" in the last 168 hours. No results for input(s): "LIPASE", "AMYLASE" in the last 168 hours. No results for input(s): "AMMONIA" in the last 168 hours. Coagulation Profile: No results for input(s): "INR", "PROTIME" in the last 168 hours. Cardiac Enzymes: No results for input(s): "CKTOTAL", "CKMB", "CKMBINDEX", "TROPONINI" in the last 168 hours. BNP (last 3 results) No results for input(s): "  PROBNP" in the last 8760 hours. HbA1C: No results for input(s): "HGBA1C" in the last 72 hours. CBG: No results for input(s): "GLUCAP" in the last 168 hours. Lipid Profile: No results for input(s): "CHOL", "HDL", "LDLCALC", "TRIG", "CHOLHDL", "LDLDIRECT" in the last 72 hours. Thyroid Function Tests: No results for input(s): "TSH", "T4TOTAL", "FREET4",  "T3FREE", "THYROIDAB" in the last 72 hours. Anemia Panel: No results for input(s): "VITAMINB12", "FOLATE", "FERRITIN", "TIBC", "IRON", "RETICCTPCT" in the last 72 hours. Sepsis Labs: No results for input(s): "PROCALCITON", "LATICACIDVEN" in the last 168 hours.  No results found for this or any previous visit (from the past 240 hour(s)).    Radiology Studies: No results found.  Scheduled Meds:  amLODipine  10 mg Oral Daily   ARIPiprazole  5 mg Oral Daily   vitamin C  1,000 mg Oral Daily   carvedilol  3.125 mg Oral BID WC   collagenase   Topical Daily   enoxaparin (LOVENOX) injection  40 mg Subcutaneous Q24H   escitalopram  5 mg Oral Daily   furosemide  40 mg Oral Daily   gabapentin  900 mg Oral TID   isosorbide mononitrate  15 mg Oral Daily   levalbuterol  0.63 mg Nebulization BID   melatonin  3 mg Oral QHS   mometasone-formoterol  2 puff Inhalation BID   morphine  30 mg Oral Q12H   multivitamin with minerals  1 tablet Oral Daily   nystatin  5 mL Oral QID   pantoprazole  40 mg Oral Daily   thiamine  100 mg Oral Daily   Continuous Infusions:  magnesium sulfate bolus IVPB       LOS: 98 days   Time spent: 25 minutes   Joseph Art, DO [facility]  If 7PM-7AM, please contact night-coverage [email or web address] 03/15/2023, 9:44 AM

## 2023-03-16 DIAGNOSIS — I96 Gangrene, not elsewhere classified: Secondary | ICD-10-CM | POA: Diagnosis not present

## 2023-03-16 NOTE — Progress Notes (Signed)
CSW spoke with patient briefly at bedside - patient reports he has no questions at this time.  CSW informed unit secretary that she could provide patient with my contact information if he requests it.  Edwin Dada, MSW, [name] Transitions of Care  Clinical Social Worker II [phone]

## 2023-03-16 NOTE — Progress Notes (Signed)
PROGRESS NOTE [name]  MYT:[id number] DOB: [birth year] DOA: 12/07/2022 PCP: [name], NP  Brief Narrative/Hospital Course: Tracen Dunkerley is a 57 y.o. 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 Hospitalization complicated by difficulty to placed.    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  Subsequently stable mobilizing in wheelchair    Chronic combined systolic and diastolic heart failure: Remains euvolemic and compensated.   Cont home Lasix Coreg hydralazine Imdur.   Essential hypertension: Fairly controlled on Coreg, amlodipine, Imdur.    COPD: Not wheezing continue Dulera twice daily albuterol, also on BID neb per request   Anxiety:Mood is stable, on Atarax as needed  GERD: Continue Protonix   Constipation cont MiraLAX , Dulcolax PRN.   Chronic pain on MS Contin 30 mg BID and oxycodone 20 milligram every 4 hourly. Will need direct supervision while  taking pills since there was some concerns for hoarding the pills.   Schizoaffective disorder: Mood is stable, seen by psychiatry during hospitalization.  Continue Abilify and Lexapro.     Oral thrush.  Continue nystatin swish and swallow  DVT prophylaxis: enoxaparin (LOVENOX) injection 40 mg Start: 02/25/23 2200 SCD's Start: 12/10/22 1731 Code Status:   Code Status: Full Code Family Communication: plan of care discussed with patient at bedside. Patient status is: Inpatient because of unsafe disposition Level of care: Med-Surg   Dispo: The patient is from: home            Anticipated disposition: SNF-difficult disposition currently medically stable. TOC following Objective: Vitals last 24 hrs: Vitals:   03/16/23 0549 03/16/23 0607 03/16/23 0803 03/16/23 0825  BP: (!) 167/106 (!) 167/106  (!) 148/109  Pulse: 64 64  71  Resp:  20    Temp: 98.2 F (36.8 C) 98.2 F (36.8 C)  (!) 97.3 F (36.3 C)  TempSrc: Oral Oral  Oral  SpO2: 99% 99% 96% 98%  Weight:      Height:       Weight change:   Physical Examination: General exam: alert awake, older than stated age HEENT:Oral mucosa moist, Ear/Nose WNL grossly Respiratory system: bilaterally clear BS, no use of accessory muscle Cardiovascular system: S1 & S2 +, No JVD. Gastrointestinal system: Abdomen soft,NT,ND, BS+ Nervous System:Alert, awake, moving extremities. Extremities: LE edema neg, b/l BKA Skin: No rashes,no icterus. MSK: Normal muscle bulk,tone, power  Medications reviewed:  Scheduled Meds:  amLODipine  10 mg Oral Daily   ARIPiprazole  5 mg Oral Daily   vitamin C  1,000 mg Oral Daily   carvedilol  3.125 mg Oral BID  WC   collagenase   Topical Daily   enoxaparin (LOVENOX) injection  40 mg Subcutaneous Q24H   escitalopram  5 mg Oral Daily   furosemide  40 mg Oral Daily   gabapentin  900 mg Oral TID   isosorbide mononitrate  15 mg Oral Daily   levalbuterol  0.63 mg Nebulization BID   melatonin  3 mg Oral QHS    mometasone-formoterol  2 puff Inhalation BID   morphine  30 mg Oral Q12H   multivitamin with minerals  1 tablet Oral Daily   nystatin  5 mL Oral QID   pantoprazole  40 mg Oral Daily   thiamine  100 mg Oral Daily   Continuous Infusions:  magnesium sulfate bolus IVPB        Diet Order             Diet regular Room service appropriate? Yes; Fluid consistency: Thin  Diet effective now                            Intake/Output Summary (Last 24 hours) at 03/16/2023 1052 Last data filed at 03/15/2023 1700 Gross per 24 hour  Intake 840 ml  Output 300 ml  Net 540 ml   Net IO Since Admission: -43,100.28 mL [03/16/23 1052]  Wt Readings from Last 3 Encounters:  02/28/23 88.6 kg  12/03/22 83.1 kg  11/03/22 79 kg     Unresulted Labs (From admission, onward)     Start     Ordered   03/04/23 0500  Creatinine, serum  (enoxaparin (LOVENOX)    CrCl >/= 30 ml/min)  Weekly,   R     Comments: while on enoxaparin therapy   Question:  Specimen collection method  Answer:  Lab=Lab collect   02/25/23 1637          Data Reviewed: I have personally reviewed following labs and imaging studies CBC: Recent Labs  Lab 03/11/23 0713  WBC 7.0  HGB 15.2  HCT 45.6  MCV 89.4  PLT 277   Basic Metabolic Panel: Recent Labs  Lab 03/11/23 0713  NA 137  K 4.1  CL 106  CO2 25  GLUCOSE 88  BUN 16  CREATININE 0.93  CALCIUM 9.2  MG 2.0  Antimicrobials: Anti-infectives (From admission, onward)    Start     Dose/Rate Route Frequency Ordered Stop   12/26/22 2200  doxycycline (VIBRA-TABS) tablet 100 mg        100 mg Oral Every 12 hours 12/26/22 1307 01/08/23 2126   12/24/22 1000  doxycycline (VIBRA-TABS) tablet 100 mg  Status:  Discontinued        100 mg Oral Every 12 hours 12/24/22 0715 12/26/22 1307   12/10/22 1830  ceFAZolin (ANCEF) IVPB 2g/100 mL premix  Status:  Discontinued        2 g 200 mL/hr over 30 Minutes Intravenous Every 8 hours 12/10/22 1731 12/10/22 1740   12/10/22  1330  ceFAZolin (ANCEF) IVPB 2g/100 mL premix        2 g 200 mL/hr over 30 Minutes Intravenous On call to O.R. 12/10/22 0959 12/10/22 1534   12/08/22 0530  vancomycin (VANCOREADY) IVPB 1500 mg/300 mL        1,500 mg 150 mL/hr over 120 Minutes Intravenous Every 12 hours 12/07/22 1741 12/11/22 0955   12/08/22 0345  metroNIDAZOLE (FLAGYL) IVPB 500 mg        500 mg 100 mL/hr over 60 Minutes Intravenous  Every 12 hours 12/07/22 1742 12/12/22 0344   12/07/22 2330  ceFEPIme (MAXIPIME) 2 g in sodium chloride 0.9 % 100 mL IVPB        2 g 200 mL/hr over 30 Minutes Intravenous Every 8 hours 12/07/22 1741 12/11/22 1529   12/07/22 2200  metroNIDAZOLE (FLAGYL) IVPB 500 mg  Status:  Discontinued        500 mg 100 mL/hr over 60 Minutes Intravenous Every 12 hours 12/07/22 2048 12/07/22 2056   12/07/22 1530  ceFEPIme (MAXIPIME) 2 g in sodium chloride 0.9 % 100 mL IVPB        2 g 200 mL/hr over 30 Minutes Intravenous  Once 12/07/22 1516 12/07/22 1734   12/07/22 1530  vancomycin (VANCOREADY) IVPB 1500 mg/300 mL        1,500 mg 150 mL/hr over 120 Minutes Intravenous  Once 12/07/22 1516 12/09/22 0333   12/07/22 1515  metroNIDAZOLE (FLAGYL) IVPB 500 mg        500 mg 100 mL/hr over 60 Minutes Intravenous  Once 12/07/22 1506 12/07/22 1734   12/07/22 1500  ceFAZolin (ANCEF) IVPB 1 g/50 mL premix  Status:  Discontinued        1 g 100 mL/hr over 30 Minutes Intravenous  Once 12/07/22 1452 12/07/22 1513      Culture/Microbiology    Component Value Date/Time   SDES  12/07/2022 1730    BLOOD RIGHT ANTECUBITAL Performed at [facility], [address], [place], [name] [address]    SDES  12/07/2022 1730    BLOOD SITE NOT SPECIFIED Performed at [facility], [address] [place], [name] [address]    SPECREQUEST  12/07/2022 1730    BOTTLES DRAWN AEROBIC AND ANAEROBIC Blood Culture adequate volume Performed at [facility], [address]  [place], [name] [address]    SPECREQUEST  12/07/2022 1730    BOTTLES DRAWN AEROBIC ONLY Blood Culture results may not be optimal due to an excessive volume of blood received in culture bottles Performed at [facility], [address] [place], [name] [address]    CULT  12/07/2022 1730    NO GROWTH 5 DAYS Performed at [facility], [address], [place], [name] [address]    CULT  12/07/2022 1730    NO GROWTH 5 DAYS Performed at [facility], [address], [place], [name] [address]    REPTSTATUS 12/12/2022 FINAL 12/07/2022 1730   REPTSTATUS 12/12/2022 FINAL 12/07/2022 1730   Radiology Studies: No results found.   LOS: 99 days   Lanae Boast, MD [facility]  03/16/2023, 10:52 AM

## 2023-03-17 DIAGNOSIS — I96 Gangrene, not elsewhere classified: Secondary | ICD-10-CM | POA: Diagnosis not present

## 2023-03-17 NOTE — Progress Notes (Signed)
Physical Therapy Treatment Patient Details Name: [name] MRN: [id number] DOB: September 04, 1966 Today's Date: 03/17/2023   History of Present Illness Pt is a 57 y/o M who presented 12/17/22 with L ankle cellulitis and underwent L BKA on 1/10. PMH includes asthma, bioplar 1, depression, schizoaffective disorder, CVA, chronic CHF, homelessness, HTN, DM2, cocaine and cannabis use, and R AKA.    PT Comments    Continuing work on functional mobility and activity tolerance;  Session focused on addressing [name]'s goal to be able to transfer wheelchair to floor and back to wheelchair independently;   [name] participates fully when the intervention is fully in line with his goals; While time constraints in the hospital are ever-present, it is worth taking the time to connect the activity/exercise/skill you'd like to work on with [name]'s stated goals; Anyone would want a say in their care, and [name] is no exception; if he begins to perceive that he is not being listened to, he tends to shut down and end the session; In the hospital setting, it can be hard to schedule PT and OT sessions, but have noted that [name] is better able to particiapte when he is given a ballpark time to anticipate his PT session;   Will consider re-ordering OT, given the shift in dc plan to an apartment through the [employer] program (managed by [facility]); Discussed recent aspects of his case with Lyman Speller., OT, who is familiar with [name];   Took extra time to discuss options for getting in and out of wheelchair to/from floor, ie starting facing wc, backing to it, approaching sideways, etc -- with this PT demonstrating options; after that, discussed breaking the skill of wc<>floor transfers into smaller parts to begin the work; Also discussed getting into wc from different surfaces, and how that effects how the task feels; Reassured [name] that we will not get down to the actual floor until he feels ready;   Opted to begin with transferring up  to a step stool placed on his bed; Alik initiated getting up posteriorly, in a tricep-dip-push-up type movement; [name] transferred from bed surface to padded stepstool on the bed with minguard assist (with and without physcial contact); started with stepstool behind him, then trurned to face stepstool, with hands on stepstool, and LE in kneeling position (would put elbows down on stepstool to take rest breaks; It took time and problem-solving, and with minguard (hands-on contact for reassurance), [name] was able to engage core, push through hands, lift trunk and turn enough to get one hip up onto stool, and then the other one; Able to lower self from the stepstool back to bed surface anteriorly with relative ease; the give of the soft, padded surface of the bed proved to be a challenge, and I wonder if [name] would prefer to continue forking on floor transfers on the mat table;   Posted new exercises aimed at trunk and hip strengthening/stretching to prep for getting prostheses in pt's room -- specifically, sidelying exercises aimed at hip abductor and extensor strengthening and hip flexor stretching; We talked about doing the exercises regularly, and [name] agreed to have Mobility Specialists sign back on to facilitate these exercises [place] will benefit from manual hip flexor stretching); Discussed [name]'s case with [name] T., Mobility Specialist;   Looking ahead to plans for transitioning out of the acute care hospital, [name] is at a functional level where he can manage independently at a wheelchair transfer level (at the beginning of the session, he transferred wc to bed as  this PT posted his exercises up in his room, without any kind of assist or guard); Getting to safe housing with community supports in place is what he needs; Getting Mental/behavioral Health support through the program with [facility], as well as PCA services, [facility] support, etc, will be optimal for [name]; also recommend HHOT/PT initially to  help with the transition to his apartment (with Outpt PT/OT follow up after [facility] therapy course)    Recommendations for follow up therapy are one component of a multi-disciplinary discharge planning process, led by the attending physician.  Recommendations may be updated based on patient status, additional functional criteria and insurance authorization.  Follow Up Recommendations  Can patient physically be transported by private vehicle:  (If a car transfer is meaningful for pt, can work on)    Assistance Recommended at Discharge PRN  Patient can return home with the following Assist for transportation   Equipment Recommendations  Other (comment) (can consider sliding board, shower seat or tub transfer bench)    Recommendations for Other Services Other (comment) (Recommend starting the Mental/Behavioral Health support through [name] to start while [name] is here in the hospital)     Precautions / Restrictions Precautions Precautions: Fall (Fall risk is present, but minimal) Precaution Comments: R AKA, L BKA Restrictions Weight Bearing Restrictions: Yes RLE Weight Bearing: Non weight bearing LLE Weight Bearing: Non weight bearing     Mobility  Bed Mobility Overal bed mobility: Independent             General bed mobility comments: Moving around in the bed well enough to repostion his wc around foot of bed while in bed    Transfers Overall transfer level: Modified independent Equipment used: None Transfers: Bed to chair/wheelchair/BSC         Anterior-Posterior transfers: Modified independent (Device/Increase time)   General transfer comment: Krystle performed an ant-post transfer wheelchair to bed;  with wheelchair beside bed, he moved both armrests, turned his body to with his back towards the bed, and moved backwards onto bed; Once in bed, with wheelchair brakes were still locked, and while in teh bed, Rodgers moved his wheelchair around the foot of the bed (with  lateral, reciprocal shifts from one wheel to the other) to the other side of the bed (where he likes to ant-post transfer back into wheelchair)    Ambulation/Gait                   Stairs             Wheelchair Mobility    Modified Rankin (Stroke Patients Only)       Balance     Sitting balance-Leahy Scale: Normal                                      Cognition Arousal/Alertness: Awake/alert Behavior During Therapy: WFL for tasks assessed/performed Overall Cognitive Status: Within Functional Limits for tasks assessed (for mobiltiy tasks we worked on)                                 General Comments: When [place] understands rationale for an exercise or activity, and that rationale is in line with his goals, he participates much better        Exercises      General Comments Opted to begin with transferring up to  a step stool placed on his bed; Dorse initiated getting up posteriorly, in a tricep-dip-push-up type movement; [name] transferred from bed surface to padded stepstool on the bed with minguard assist (with and without physcial contact); started with stepstool behind him, then trurned to face stepstool, with hands on stepstool, and LE in kneeling position (would put elbows down on stepstool to take rest breaks; It took time and problem-solving, and with minguard (hands-on contact for reassurance), Ahmari was able to engage core, push through hands, lift trunk and turn enough to get one hip up onto stool, and then the other one; Able to lower self from the stepstool back to bed surface anteriorly with relative ease      Pertinent Vitals/Pain Pain Assessment Pain Assessment: Faces Faces Pain Scale: Hurts a little bit Pain Location: Grimace with effort Pain Descriptors / Indicators: Grimacing Pain Intervention(s): Monitored during session    Home Living                          Prior Function            PT Goals  (current goals can now be found in the care plan section) Acute Rehab PT Goals Patient Stated Goal: Prepare for prosthetics fitting; Would like to get housing in [facility] through the Transition to [employer] PT Goal Formulation: With patient Time For Goal Achievement: 04/01/23 (Goals set on 4/3 continue to be appropriate) Potential to Achieve Goals: Good Progress towards PT goals: Progressing toward goals    Frequency    Min 1X/week      PT Plan Current plan remains appropriate    Co-evaluation              AM-PAC PT "6 Clicks" Mobility   Outcome Measure  Help needed turning from your back to your side while in a flat bed without using bedrails?: None Help needed moving from lying on your back to sitting on the side of a flat bed without using bedrails?: None Help needed moving to and from a bed to a chair (including a wheelchair)?: None Help needed standing up from a chair using your arms (e.g., wheelchair or bedside chair)?: Total Help needed to walk in hospital room?: Total Help needed climbing 3-5 steps with a railing? : Total 6 Click Score: 15    End of Session   Activity Tolerance: Patient tolerated treatment well Patient left: in bed;Other (comment) (preparing to et up to wc to get to the bathroom) Nurse Communication: Mobility status PT Visit Diagnosis: Muscle weakness (generalized) (M62.81);Other abnormalities of gait and mobility (R26.89)     Time: 1610-9604 PT Time Calculation (min) (ACUTE ONLY): 31 min  Charges:  $Therapeutic Activity: 23-37 mins                     Van Clines, PT  Acute Rehabilitation Services Office [phone]    [name] 03/17/2023, 9:59 PM

## 2023-03-17 NOTE — Plan of Care (Signed)
  Problem: Fluid Volume: Goal: Hemodynamic stability will improve Outcome: Adequate for Discharge   Problem: Activity: Goal: Risk for activity intolerance will decrease Outcome: Adequate for Discharge   Problem: Nutrition: Goal: Adequate nutrition will be maintained Outcome: Adequate for Discharge

## 2023-03-17 NOTE — Progress Notes (Signed)
PROGRESS NOTE Ralph Brouwer  ZOX:[id number] DOB: [birth year] DOA: 12/07/2022 PCP: [name], NP  Brief Narrative/Hospital Course: [name] is a 57 y.o. 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 Hospitalization complicated by difficulty to placed.   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  Subsequently stable mobilizing in wheelchair    Chronic combined systolic and diastolic heart failure: Remains euvolemic and compensated.   Cont home Lasix Coreg hydralazine Imdur.   Essential hypertension: Fairly controlled on Coreg, amlodipine, Imdur.    COPD: Not wheezing continue Dulera twice daily albuterol, also on BID neb per request   Anxiety:Mood is stable, on Atarax as needed  GERD: Continue Protonix   Constipation cont MiraLAX , Dulcolax PRN.   Chronic pain on MS Contin 30 mg BID and oxycodone 20 milligram  every 4 hourly. Will need direct supervision while taking pills since there was some concerns for hoarding the pills.   Schizoaffective disorder: Mood is stable, seen by psychiatry during hospitalization.  Continue Abilify and Lexapro.     Oral thrush.  Continue nystatin swish and swallow  DVT prophylaxis: enoxaparin (LOVENOX) injection 40 mg Start: 02/25/23 2200 SCD's Start: 12/10/22 1731 Code Status:   Code Status: Full Code Family Communication: plan of care discussed with patient at bedside. Patient status is: Inpatient because of unsafe disposition Level of care: Med-Surg   Dispo: The patient is from: home            Anticipated disposition: SNF-difficult disposition currently medically stable. TOC following Objective: Vitals last 24 hrs: Vitals:   03/16/23 2018 03/16/23 2026 03/16/23 2029 03/17/23 0900  BP: 115/72   (!) 140/93  Pulse: 77   82  Resp: 14   16  Temp: 97.9 F (36.6 C)   98.1 F (36.7 C)  TempSrc: Oral   Oral  SpO2: 100% 96% 96% 96%  Weight:      Height:       Weight change:   Physical Examination: General exam: alert awake, oriented x 3, older than stated age HEENT:Oral mucosa moist, Ear/Nose WNL grossly Respiratory system: Bilaterally clear BS, no use of accessory muscle Cardiovascular system: S1 & S2 +, No JVD. Gastrointestinal system: Abdomen soft,NT,ND, BS+ Nervous System: Alert, awake, moving extremities, he follows commands. Extremities: LE edema neg b/l BKA Skin: No rashes,no icterus. MSK: Normal muscle bulk,tone, power   Medications reviewed:  Scheduled Meds:  amLODipine  10 mg Oral Daily   ARIPiprazole  5 mg Oral Daily   vitamin C  1,000 mg Oral Daily  carvedilol  3.125 mg Oral BID WC   collagenase   Topical Daily   enoxaparin (LOVENOX) injection  40 mg Subcutaneous Q24H   escitalopram  5 mg Oral Daily   furosemide  40 mg Oral Daily   gabapentin  900 mg Oral TID   isosorbide mononitrate  15 mg Oral Daily   levalbuterol  0.63 mg  Nebulization BID   melatonin  3 mg Oral QHS   mometasone-formoterol  2 puff Inhalation BID   morphine  30 mg Oral Q12H   multivitamin with minerals  1 tablet Oral Daily   nystatin  5 mL Oral QID   pantoprazole  40 mg Oral Daily   thiamine  100 mg Oral Daily   Continuous Infusions:  magnesium sulfate bolus IVPB        Diet Order             Diet regular Room service appropriate? Yes; Fluid consistency: Thin  Diet effective now                            Intake/Output Summary (Last 24 hours) at 03/17/2023 1041 Last data filed at 03/17/2023 0900 Gross per 24 hour  Intake 720 ml  Output 500 ml  Net 220 ml    Net IO Since Admission: -42,520.28 mL [03/17/23 1041]  Wt Readings from Last 3 Encounters:  02/28/23 88.6 kg  12/03/22 83.1 kg  11/03/22 79 kg     Unresulted Labs (From admission, onward)     Start     Ordered   03/04/23 0500  Creatinine, serum  (enoxaparin (LOVENOX)    CrCl >/= 30 ml/min)  Weekly,   R     Comments: while on enoxaparin therapy   Question:  Specimen collection method  Answer:  Lab=Lab collect   02/25/23 1637          Data Reviewed: I have personally reviewed following labs and imaging studies CBC: Recent Labs  Lab 03/11/23 0713  WBC 7.0  HGB 15.2  HCT 45.6  MCV 89.4  PLT 277    Basic Metabolic Panel: Recent Labs  Lab 03/11/23 0713  NA 137  K 4.1  CL 106  CO2 25  GLUCOSE 88  BUN 16  CREATININE 0.93  CALCIUM 9.2  MG 2.0   Antimicrobials: Anti-infectives (From admission, onward)    Start     Dose/Rate Route Frequency Ordered Stop   12/26/22 2200  doxycycline (VIBRA-TABS) tablet 100 mg        100 mg Oral Every 12 hours 12/26/22 1307 01/08/23 2126   12/24/22 1000  doxycycline (VIBRA-TABS) tablet 100 mg  Status:  Discontinued        100 mg Oral Every 12 hours 12/24/22 0715 12/26/22 1307   12/10/22 1830  ceFAZolin (ANCEF) IVPB 2g/100 mL premix  Status:  Discontinued        2 g 200 mL/hr over 30 Minutes Intravenous  Every 8 hours 12/10/22 1731 12/10/22 1740   12/10/22 1330  ceFAZolin (ANCEF) IVPB 2g/100 mL premix        2 g 200 mL/hr over 30 Minutes Intravenous On call to O.R. 12/10/22 0959 12/10/22 1534   12/08/22 0530  vancomycin (VANCOREADY) IVPB 1500 mg/300 mL        1,500 mg 150 mL/hr over 120 Minutes Intravenous Every 12 hours 12/07/22 1741 12/11/22 0955   12/08/22 0345  metroNIDAZOLE (FLAGYL) IVPB 500 mg  500 mg 100 mL/hr over 60 Minutes Intravenous Every 12 hours 12/07/22 1742 12/12/22 0344   12/07/22 2330  ceFEPIme (MAXIPIME) 2 g in sodium chloride 0.9 % 100 mL IVPB        2 g 200 mL/hr over 30 Minutes Intravenous Every 8 hours 12/07/22 1741 12/11/22 1529   12/07/22 2200  metroNIDAZOLE (FLAGYL) IVPB 500 mg  Status:  Discontinued        500 mg 100 mL/hr over 60 Minutes Intravenous Every 12 hours 12/07/22 2048 12/07/22 2056   12/07/22 1530  ceFEPIme (MAXIPIME) 2 g in sodium chloride 0.9 % 100 mL IVPB        2 g 200 mL/hr over 30 Minutes Intravenous  Once 12/07/22 1516 12/07/22 1734   12/07/22 1530  vancomycin (VANCOREADY) IVPB 1500 mg/300 mL        1,500 mg 150 mL/hr over 120 Minutes Intravenous  Once 12/07/22 1516 12/09/22 0333   12/07/22 1515  metroNIDAZOLE (FLAGYL) IVPB 500 mg        500 mg 100 mL/hr over 60 Minutes Intravenous  Once 12/07/22 1506 12/07/22 1734   12/07/22 1500  ceFAZolin (ANCEF) IVPB 1 g/50 mL premix  Status:  Discontinued        1 g 100 mL/hr over 30 Minutes Intravenous  Once 12/07/22 1452 12/07/22 1513      Culture/Microbiology    Component Value Date/Time   SDES  12/07/2022 1730    BLOOD RIGHT ANTECUBITAL Performed at [facility], [address], [place], [name] [address]    SDES  12/07/2022 1730    BLOOD SITE NOT SPECIFIED Performed at [facility], [address] [place], [name] [address]    SPECREQUEST  12/07/2022 1730    BOTTLES DRAWN AEROBIC AND ANAEROBIC Blood Culture adequate volume Performed at  [facility], [address], [place], [name] [address]    SPECREQUEST  12/07/2022 1730    BOTTLES DRAWN AEROBIC ONLY Blood Culture results may not be optimal due to an excessive volume of blood received in culture bottles Performed at [facility], [address] [place], [name] [address]    CULT  12/07/2022 1730    NO GROWTH 5 DAYS Performed at [facility], [address] [place], [name] [address]    CULT  12/07/2022 1730    NO GROWTH 5 DAYS Performed at [facility], [address] [place], [name] [address]    REPTSTATUS 12/12/2022 FINAL 12/07/2022 1730   REPTSTATUS 12/12/2022 FINAL 12/07/2022 1730   Radiology Studies: No results found.   LOS: 100 days   Lanae Boast, MD [facility]  03/17/2023, 10:41 AM

## 2023-03-18 ENCOUNTER — Inpatient Hospital Stay (HOSPITAL_COMMUNITY): Payer: Medicaid Other

## 2023-03-18 DIAGNOSIS — I96 Gangrene, not elsewhere classified: Secondary | ICD-10-CM | POA: Diagnosis not present

## 2023-03-18 LAB — BASIC METABOLIC PANEL
Anion gap: 10 (ref 5–15)
BUN: 15 mg/dL (ref 6–20)
CO2: 23 mmol/L (ref 22–32)
Calcium: 9.3 mg/dL (ref 8.9–10.3)
Chloride: 106 mmol/L (ref 98–111)
Creatinine, Ser: 0.92 mg/dL (ref 0.61–1.24)
GFR, Estimated: >60 mL/min (ref >60)
Glucose, Bld: 163 mg/dL — ABNORMAL HIGH (ref 70–99)
Potassium: 3.8 mmol/L (ref 3.5–5.1)
Sodium: 139 mmol/L (ref 135–145)

## 2023-03-18 LAB — CBC
HCT: 43.1 % (ref 39.0–52.0)
Hemoglobin: 14.8 g/dL (ref 13.0–17.0)
MCH: 30.1 pg (ref 26.0–34.0)
MCHC: 34.3 g/dL (ref 30.0–36.0)
MCV: 87.8 fL (ref 80.0–100.0)
Platelets: 207 10*3/uL (ref 150–400)
RBC: 4.91 MIL/uL (ref 4.22–5.81)
RDW: 15.2 % (ref 11.5–15.5)
WBC: 6.2 10*3/uL (ref 4.0–10.5)
nRBC: 0 % (ref 0.0–0.2)

## 2023-03-18 MED ORDER — SODIUM CHLORIDE 0.9 % IV SOLN: 3.0000 g | Freq: Four times a day (QID) | INTRAVENOUS | Status: DC

## 2023-03-18 MED ORDER — SODIUM CHLORIDE 0.9 % IV SOLN
3.0000 g | Freq: Four times a day (QID) | INTRAVENOUS | Status: DC
  Filled 2023-03-18: qty 8

## 2023-03-18 NOTE — Progress Notes (Signed)
Pharmacy Antibiotic Note  [name] is a 57 y.o. male admitted on 12/07/2022. Patient has developed generalized swelling of L maxilla area, CT maxillary showing odontogenic abscess.  Pharmacy has been consulted for Unasyn dosing for dental infection.  Plan: Unasyn 3g IV q6 hours  Height: 5\' 9"  (175.3 cm) Weight: 88.6 kg (195 lb 5.2 oz) IBW/kg (Calculated) : 70.7  Temp (24hrs), Avg:98 F (36.7 C), Min:98 F (36.7 C), Max:98 F (36.7 C)  Recent Labs  Lab 03/18/23 0943  WBC 6.2  CREATININE 0.92    Estimated Creatinine Clearance: 101.1 mL/min (by C-G formula based on SCr of 0.92 mg/dL).    Allergies  Allergen Reactions   Motrin [Ibuprofen] Anaphylaxis   Tylenol [Acetaminophen] Hives, Nausea And Vomiting and Rash   Lyrica Cr [Pregabalin Er] Other (See Comments)    Dystonia Dysarthria    Peanut-Containing Drug Products Other (See Comments)    Pt denies allergy to peanut butter 03/02/2021    Antimicrobials this admission: Unasyn 4/17 >> No other abx since 2/8  Dose adjustments this admission:  Microbiology results: N/A  Thank you for allowing pharmacy to be a part of this patient's care.  Rexford Maus, PharmD, BCPS 03/18/2023 3:34 PM

## 2023-03-18 NOTE — Progress Notes (Signed)
Arrived to room for IV start per request.  Patient refuses to turn on his back and will not allow for assessment of left arm.  We have attempted x1 in a vein that was found with [place] on the right.  It unfortunately would not thread all the way in and patient is refusing any further attempts. Bedside RN updated.

## 2023-03-18 NOTE — Progress Notes (Signed)
PROGRESS NOTE [name]  ZOX:[id number] DOB: 11-12-1966 DOA: 12/07/2022 PCP: [name], NP  Brief Narrative/Hospital Course: [name] is a 57 y.o. 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  Subsequently stable mobilizing in wheelchair    Chronic combined systolic and diastolic heart failure: Remains euvolemic and compensated.   Cont home Lasix Coreg hydralazine Imdur.  Painful swelling on left maxillary area, generalized swelling, reports swelling gets better and worse occasionally, get CT maxillary.Dental caries noted. No significant tenderness on palpation.  Obtain routine labs as well.  Essential hypertension: Fairly controlled on Coreg, amlodipine, Imdur.    COPD: Not  wheezing continue Dulera twice daily albuterol, also on BID neb per request   Anxiety:Mood is stable, on Atarax as needed  GERD: Continue Protonix   Constipation cont MiraLAX , Dulcolax PRN.   Chronic pain on MS Contin 30 mg BID and oxycodone 20 milligram every 4 hourly. Will need direct supervision while taking pills since there was some concerns for hoarding the pills.   Schizoaffective disorder: Mood is stable, seen by psychiatry during hospitalization.  Continue Abilify and Lexapro.     Oral thrush.  Continue nystatin swish and swallow  DVT prophylaxis: enoxaparin (LOVENOX) injection 40 mg Start: 02/25/23 2200 SCD's Start: 12/10/22 1731 Code Status:   Code Status: Full Code Family Communication: plan of care discussed with patient at bedside. Patient status is: Inpatient because of unsafe disposition Level of care: Med-Surg   Dispo: The patient is from: home            Anticipated disposition: SNF-difficult disposition currently medically stable. TOC following Objective: Vitals last 24 hrs: Vitals:   03/17/23 2042 03/17/23 2047 03/17/23 2113 03/18/23 0751  BP:   132/88 (!) 152/84  Pulse:   65 66  Resp:   18 17  Temp:   98 F (36.7 C) 98 F (36.7 C)  TempSrc:   Oral Oral  SpO2: 99% 99% 95% 95%  Weight:      Height:       Weight change:   Physical Examination: General exam: alert awake, oriented, pleasant, older than stated age HEENT:Oral mucosa moist, Ear/Nose WNL grossly Respiratory system: Bilaterally clear BS, no use of accessory muscle Cardiovascular system: S1 & S2 +, No JVD. Gastrointestinal system: Abdomen soft,NT,ND, BS+ Nervous System: Alert, awake, moving extremities, he follows commands. Extremities:b/l BKA on WC Skin: No rashes,no icterus. MSK: Normal muscle  bulk,tone, power Dental caries multiple areas some swelling of the bilateral upper GUMS more on left upper   Medications reviewed:  Scheduled Meds:  amLODipine  10 mg Oral Daily    ARIPiprazole  5 mg Oral Daily   vitamin C  1,000 mg Oral Daily   carvedilol  3.125 mg Oral BID WC   collagenase   Topical Daily   enoxaparin (LOVENOX) injection  40 mg Subcutaneous Q24H   escitalopram  5 mg Oral Daily   furosemide  40 mg Oral Daily   gabapentin  900 mg Oral TID   isosorbide mononitrate  15 mg Oral Daily   levalbuterol  0.63 mg Nebulization BID   melatonin  3 mg Oral QHS   mometasone-formoterol  2 puff Inhalation BID   morphine  30 mg Oral Q12H   multivitamin with minerals  1 tablet Oral Daily   nystatin  5 mL Oral QID   pantoprazole  40 mg Oral Daily   thiamine  100 mg Oral Daily   Continuous Infusions:  magnesium sulfate bolus IVPB        Diet Order             Diet regular Room service appropriate? Yes; Fluid consistency: Thin  Diet effective now                   Intake/Output Summary (Last 24 hours) at 03/18/2023 0904 Last data filed at 03/17/2023 1700 Gross per 24 hour  Intake 720 ml  Output 300 ml  Net 420 ml    Net IO Since Admission: -42,100.28 mL [03/18/23 0904]  Wt Readings from Last 3 Encounters:  02/28/23 88.6 kg  12/03/22 83.1 kg  11/03/22 79 kg     Unresulted Labs (From admission, onward)     Start     Ordered   03/04/23 0500  Creatinine, serum  (enoxaparin (LOVENOX)    CrCl >/= 30 ml/min)  Weekly,   R     Comments: while on enoxaparin therapy   Question:  Specimen collection method  Answer:  Lab=Lab collect   02/25/23 1637          Data Reviewed: I have personally reviewed following labs and imaging studies CBC: No results for input(s): "WBC", "NEUTROABS", "HGB", "HCT", "MCV", "PLT" in the last 168 hours.  Basic Metabolic Panel: No results for input(s): "NA", "K", "CL", "CO2", "GLUCOSE", "BUN", "CREATININE", "CALCIUM", "MG", "PHOS" in the last 168 hours. Antimicrobials: Anti-infectives (From admission, onward)    Start     Dose/Rate Route Frequency Ordered Stop   12/26/22 2200  doxycycline (VIBRA-TABS) tablet 100  mg        100 mg Oral Every 12 hours 12/26/22 1307 01/08/23 2126   12/24/22 1000  doxycycline (VIBRA-TABS) tablet 100 mg  Status:  Discontinued        100 mg Oral Every 12 hours 12/24/22 0715 12/26/22 1307   12/10/22 1830  ceFAZolin (ANCEF) IVPB 2g/100 mL premix  Status:  Discontinued        2 g 200 mL/hr over 30 Minutes Intravenous Every 8 hours 12/10/22 1731 12/10/22 1740   12/10/22 1330  ceFAZolin (ANCEF) IVPB 2g/100 mL premix        2 g 200 mL/hr over 30 Minutes Intravenous On call to O.R. 12/10/22 0959 12/10/22 1534   12/08/22 0530  vancomycin (VANCOREADY) IVPB 1500 mg/300 mL        1,500 mg 150 mL/hr over 120 Minutes Intravenous Every 12 hours 12/07/22 1741 12/11/22  3846   12/08/22 0345  metroNIDAZOLE (FLAGYL) IVPB 500 mg        500 mg 100 mL/hr over 60 Minutes Intravenous Every 12 hours 12/07/22 1742 12/12/22 0344   12/07/22 2330  ceFEPIme (MAXIPIME) 2 g in sodium chloride 0.9 % 100 mL IVPB        2 g 200 mL/hr over 30 Minutes Intravenous Every 8 hours 12/07/22 1741 12/11/22 1529   12/07/22 2200  metroNIDAZOLE (FLAGYL) IVPB 500 mg  Status:  Discontinued        500 mg 100 mL/hr over 60 Minutes Intravenous Every 12 hours 12/07/22 2048 12/07/22 2056   12/07/22 1530  ceFEPIme (MAXIPIME) 2 g in sodium chloride 0.9 % 100 mL IVPB        2 g 200 mL/hr over 30 Minutes Intravenous  Once 12/07/22 1516 12/07/22 1734   12/07/22 1530  vancomycin (VANCOREADY) IVPB 1500 mg/300 mL        1,500 mg 150 mL/hr over 120 Minutes Intravenous  Once 12/07/22 1516 12/09/22 0333   12/07/22 1515  metroNIDAZOLE (FLAGYL) IVPB 500 mg        500 mg 100 mL/hr over 60 Minutes Intravenous  Once 12/07/22 1506 12/07/22 1734   12/07/22 1500  ceFAZolin (ANCEF) IVPB 1 g/50 mL premix  Status:  Discontinued        1 g 100 mL/hr over 30 Minutes Intravenous  Once 12/07/22 1452 12/07/22 1513      Culture/Microbiology    Component Value Date/Time   SDES  12/07/2022 1730    BLOOD RIGHT ANTECUBITAL Performed at  [facility], [address], [place], [name] [address]    SDES  12/07/2022 1730    BLOOD SITE NOT SPECIFIED Performed at [facility], [address] [place], [name] [address]    SPECREQUEST  12/07/2022 1730    BOTTLES DRAWN AEROBIC AND ANAEROBIC Blood Culture adequate volume Performed at [facility], [address] [place], [name] [address]    SPECREQUEST  12/07/2022 1730    BOTTLES DRAWN AEROBIC ONLY Blood Culture results may not be optimal due to an excessive volume of blood received in culture bottles Performed at [facility], [address] [place], [name] [address]    CULT  12/07/2022 1730    NO GROWTH 5 DAYS Performed at [facility], [address], [place], [name] [address]    CULT  12/07/2022 1730    NO GROWTH 5 DAYS Performed at [facility], [address] [place], [name] [address]    REPTSTATUS 12/12/2022 FINAL 12/07/2022 1730   REPTSTATUS 12/12/2022 FINAL 12/07/2022 1730   Radiology Studies: No results found.   LOS: 101 days   Lanae Boast, MD [facility]  03/18/2023, 9:04 AM

## 2023-03-18 NOTE — Progress Notes (Signed)
No IV to administer pt scheduled abt, pt refused multiple attempt by the IV team, education given, attending NP aware, will continue to monitor and try again, thanks!

## 2023-03-18 NOTE — Plan of Care (Signed)
  Problem: Fluid Volume: Goal: Hemodynamic stability will improve Outcome: Completed/Met   Problem: Activity: Goal: Risk for activity intolerance will decrease Outcome: Completed/Met   Problem: Nutrition: Goal: Adequate nutrition will be maintained Outcome: Completed/Met

## 2023-03-19 DIAGNOSIS — I96 Gangrene, not elsewhere classified: Secondary | ICD-10-CM | POA: Diagnosis not present

## 2023-03-19 MED ORDER — AMOXICILLIN-POT CLAVULANATE 875-125 MG PO TABS
1.0000 | ORAL_TABLET | Freq: Two times a day (BID) | ORAL | Status: AC
  Administered 2023-03-19 – 2023-04-01 (×28): 1 via ORAL
  Filled 2023-03-19 (×28): qty 1

## 2023-03-19 NOTE — Progress Notes (Addendum)
2:25pm: CSW spoke with Konrad Dolores of [place] who is requesting clinicals to support ACT referral. CSW sent secure email with clinicals attached for review.  12:50pm: CSW spoke with patient via phone to answer his questions. Patient requesting information on the [employer] apartment complex that was discussed with [facility] staff for a possible option for housing. CSW printed information and provided it to patient to review.  Edwin Dada, MSW, [name] Transitions of Care  Clinical Social Worker II [phone]

## 2023-03-19 NOTE — Plan of Care (Signed)
  Problem: Coping: Goal: Level of anxiety will decrease Outcome: Progressing   Problem: Elimination: Goal: Will not experience complications related to bowel motility Outcome: Progressing Goal: Will not experience complications related to urinary retention Outcome: Progressing   

## 2023-03-19 NOTE — Progress Notes (Addendum)
PROGRESS NOTE [name]  VWU:[id number] DOB: November 05, 1966 DOA: 12/07/2022 PCP: [name], NP  Brief Narrative/Hospital Course: [name] is a 57 y.o. 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  Subsequently stable mobilizing in wheelchair    Chronic combined systolic and diastolic heart failure: Remains euvolemic and compensated.   Cont home Lasix Coreg hydralazine Imdur.  Gum swelling Round soft tissue lesion at anterolateral left aspect of roof 2x1.4x1.7 cm: concerned abt periapical abscess  Dr Mia Creek  consulted 4/17- spoke w/ him- he mentioned he will review and call back.I called again 03/19/23 and discussed with him,reviewed the CT maxillofacial imaging> he will be checking on the patient  this afternoon.  Later on again called by Dr. Mia Creek stating that he has discussed with the maxillofacial surgery and together he will be addressing patient's abscessHe refused iv unasyn- changed to augmentin this am. Informed by medical staff affair>Dr Drab will be seeing him tomorrow.  Essential hypertension: Fairly controlled on Coreg, amlodipine, Imdur.    COPD:Not wheezing continue Dulera twice daily albuterol, also on BID neb per request   Anxiety:Mood is stable, on Atarax as needed.  GERD:Continue Protonix.   Constipation cont MiraLAX , Dulcolax PRN.   Chronic pain on MS Contin 30 mg BID and oxycodone 20 milligram every 4 hourly. Will need direct supervision while taking pills since there was some concerns for hoarding the pills.   Schizoaffective disorder: Mood is stable, seen by psychiatry during hospitalization.  Continue Abilify and Lexapro.     Oral thrush:Continue nystatin swish and swallow  DVT prophylaxis: enoxaparin (LOVENOX) injection 40 mg Start: 02/25/23 2200 SCD's Start: 12/10/22 1731 Code Status:   Code Status: Full Code Family Communication: plan of care discussed with patient at bedside. Patient status is: Inpatient because of unsafe disposition Level of care: Med-Surg   Dispo: The patient is from: home            Anticipated disposition: SNF-difficult disposition unable to obtain a skilled nursing facility due to drug history.  Currently working on housing which has been approved.  Discussed with TOC today  Objective: Vitals last 24 hrs: Vitals:   03/18/23 0751 03/18/23 2022 03/19/23 0555 03/19/23 0830  BP: (!) 152/84 (!) 143/74 (!) 146/87 (!) 153/96  Pulse: 66 70 65 71  Resp: 17 18 18 18   Temp: 98 F (36.7 C) 97.8 F (36.6 C)  97.9 F (36.6 C)  TempSrc: Oral Oral  Oral  SpO2: 95% 98% 99% 100%  Weight:      Height:       Weight change:   Physical Examination: General exam: AAox3, weak,older appearing HEENT:Oral mucosa moist, Ear/Nose WNL  grossly, dentition normal. Respiratory system: bilaterally clear BS, no use of accessory muscle Cardiovascular system: S1 & S2 +, regular rate,. Gastrointestinal system: Abdomen soft, NT,ND,BS+ Nervous System:Alert, awake, moving extremities and grossly nonfocal Extremities: b/l BKA Skin: No rashes,no icterus. MSK: Normal muscle bulk,tone, power  Dental upper bilateral gum swollen, bad dentition.  Medications reviewed:  Scheduled Meds:  amLODipine  10 mg Oral Daily   amoxicillin-clavulanate  1 tablet Oral Q12H   ARIPiprazole  5 mg Oral Daily   vitamin C  1,000 mg Oral Daily   carvedilol  3.125 mg Oral BID WC   collagenase   Topical Daily   enoxaparin (LOVENOX) injection  40 mg Subcutaneous Q24H   escitalopram  5 mg Oral Daily   furosemide  40 mg Oral Daily   gabapentin  900 mg Oral TID   isosorbide mononitrate  15 mg Oral Daily   levalbuterol  0.63 mg Nebulization BID   melatonin  3 mg Oral QHS   mometasone-formoterol  2 puff Inhalation BID   morphine  30 mg Oral Q12H   multivitamin with minerals  1 tablet Oral Daily   nystatin  5 mL Oral QID   pantoprazole  40 mg Oral Daily   thiamine  100 mg Oral Daily   Continuous Infusions:  magnesium sulfate bolus IVPB      Diet Order             Diet regular Room service appropriate? Yes; Fluid consistency: Thin  Diet effective now                 No intake or output data in the 24 hours ending 03/19/23 1153 Net IO Since Admission: -42,040.28 mL [03/19/23 1153]  Wt Readings from Last 3 Encounters:  02/28/23 88.6 kg  12/03/22 83.1 kg  11/03/22 79 kg   Unresulted Labs (From admission, onward)     Start     Ordered   03/04/23 0500  Creatinine, serum  (enoxaparin (LOVENOX)    CrCl >/= 30 ml/min)  Weekly,   R     Comments: while on enoxaparin therapy   Question:  Specimen collection method  Answer:  Lab=Lab collect   02/25/23 1637          Data Reviewed: I have personally reviewed following labs and imaging  studies CBC: Recent Labs  Lab 03/18/23 0943  WBC 6.2  HGB 14.8  HCT 43.1  MCV 87.8  PLT 207    Basic Metabolic Panel: Recent Labs  Lab 03/18/23 0943  NA 139  K 3.8  CL 106  CO2 23  GLUCOSE 163*  BUN 15  CREATININE 0.92  CALCIUM 9.3   Antimicrobials: Anti-infectives (From admission, onward)    Start     Dose/Rate Route Frequency Ordered Stop   03/19/23 0900  amoxicillin-clavulanate (AUGMENTIN) 875-125 MG per tablet 1 tablet        1 tablet Oral Every 12 hours 03/19/23 0809     03/18/23 2000  Ampicillin-Sulbactam (UNASYN) 3 g in sodium chloride 0.9 % 100 mL IVPB  Status:  Discontinued        3 g 200 mL/hr over 30 Minutes Intravenous Every 6 hours 03/18/23 1939 03/19/23 0809   03/18/23 1645  Ampicillin-Sulbactam (UNASYN) 3 g in sodium chloride 0.9 %  100 mL IVPB  Status:  Discontinued        3 g 200 mL/hr over 30 Minutes Intravenous Every 6 hours 03/18/23 1555 03/18/23 1939   12/26/22 2200  doxycycline (VIBRA-TABS) tablet 100 mg        100 mg Oral Every 12 hours 12/26/22 1307 01/08/23 2126   12/24/22 1000  doxycycline (VIBRA-TABS) tablet 100 mg  Status:  Discontinued        100 mg Oral Every 12 hours 12/24/22 0715 12/26/22 1307   12/10/22 1830  ceFAZolin (ANCEF) IVPB 2g/100 mL premix  Status:  Discontinued        2 g 200 mL/hr over 30 Minutes Intravenous Every 8 hours 12/10/22 1731 12/10/22 1740   12/10/22 1330  ceFAZolin (ANCEF) IVPB 2g/100 mL premix        2 g 200 mL/hr over 30 Minutes Intravenous On call to O.R. 12/10/22 0959 12/10/22 1534   12/08/22 0530  vancomycin (VANCOREADY) IVPB 1500 mg/300 mL        1,500 mg 150 mL/hr over 120 Minutes Intravenous Every 12 hours 12/07/22 1741 12/11/22 0955   12/08/22 0345  metroNIDAZOLE (FLAGYL) IVPB 500 mg        500 mg 100 mL/hr over 60 Minutes Intravenous Every 12 hours 12/07/22 1742 12/12/22 0344   12/07/22 2330  ceFEPIme (MAXIPIME) 2 g in sodium chloride 0.9 % 100 mL IVPB        2 g 200 mL/hr over 30 Minutes  Intravenous Every 8 hours 12/07/22 1741 12/11/22 1529   12/07/22 2200  metroNIDAZOLE (FLAGYL) IVPB 500 mg  Status:  Discontinued        500 mg 100 mL/hr over 60 Minutes Intravenous Every 12 hours 12/07/22 2048 12/07/22 2056   12/07/22 1530  ceFEPIme (MAXIPIME) 2 g in sodium chloride 0.9 % 100 mL IVPB        2 g 200 mL/hr over 30 Minutes Intravenous  Once 12/07/22 1516 12/07/22 1734   12/07/22 1530  vancomycin (VANCOREADY) IVPB 1500 mg/300 mL        1,500 mg 150 mL/hr over 120 Minutes Intravenous  Once 12/07/22 1516 12/09/22 0333   12/07/22 1515  metroNIDAZOLE (FLAGYL) IVPB 500 mg        500 mg 100 mL/hr over 60 Minutes Intravenous  Once 12/07/22 1506 12/07/22 1734   12/07/22 1500  ceFAZolin (ANCEF) IVPB 1 g/50 mL premix  Status:  Discontinued        1 g 100 mL/hr over 30 Minutes Intravenous  Once 12/07/22 1452 12/07/22 1513      Culture/Microbiology    Component Value Date/Time   SDES  12/07/2022 1730    BLOOD RIGHT ANTECUBITAL Performed at [facility], [address], [place], [name] [address]    SDES  12/07/2022 1730    BLOOD SITE NOT SPECIFIED Performed at [facility], [address] [place], [name] [address]    SPECREQUEST  12/07/2022 1730    BOTTLES DRAWN AEROBIC AND ANAEROBIC Blood Culture adequate volume Performed at [facility], [address], [place], [name] [address]    SPECREQUEST  12/07/2022 1730    BOTTLES DRAWN AEROBIC ONLY Blood Culture results may not be optimal due to an excessive volume of blood received in culture bottles Performed at [facility], [address], [place], [name] [address]    CULT  12/07/2022 1730    NO GROWTH 5 DAYS Performed at [facility], [address] [place], [name] [address]    CULT  12/07/2022 1730    NO GROWTH 5 DAYS Performed at [facility], [address], [place], [name] [address]    REPTSTATUS 12/12/2022 FINAL 12/07/2022  1730   REPTSTATUS 12/12/2022 FINAL 12/07/2022 1730   Radiology Studies: CT MAXILLOFACIAL WO CONTRAST  Result Date: 03/18/2023 CLINICAL DATA:  Dental pain EXAM: CT MAXILLOFACIAL WITHOUT CONTRAST TECHNIQUE: Multidetector CT imaging of the maxillofacial structures was performed. Multiplanar CT image reconstructions were also generated. RADIATION DOSE REDUCTION: This exam was performed according to the departmental dose-optimization program which includes automated exposure control, adjustment of the mA and/or kV according to patient size and/or use of iterative reconstruction technique. COMPARISON:  CTA Head/neck 02/03/22 FINDINGS: Osseous: No acute fracture. Bilateral maxillary mandibular molars are absent. There are large dental caries and periapical lucencies in the bilateral mandibular and maxillary lateral incisors, canines, and premolars. Orbits: Chronic fracture of the left lamina papyracea. Orbits are otherwise unremarkable. Sinuses: Mild mucosal thickening of the floor of bilateral maxillary sinuses. Soft tissues: There is a rounded soft tissue lesion at the roof of the mouth measuring 2.0 x 1.4 x 1.7 cm (series 3, image 34). This is in a region where there is a periapical lucency with cortical breakthrough (series 7, image 35). 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 Rounded soft tissue lesion at the anterolateral left aspect of the roof of the mouth measuring 2.0 x 1.4 x 1.7 cm, which is in a region where there is a periapical lucency with cortical breakthrough around a left lateral incisor. This is favored to represent an odontogenic soft tissue abscess, but an underlying mass is not excluded. Recommend direct visualization. 2. Large dental caries and periapical lucencies in the bilateral mandibular and maxillary lateral incisors, canines, and premolars. Electronically Signed   By: Lorenza Cambridge M.D.   On: 03/18/2023 15:22     LOS: 102 days   Lanae Boast, MD [facility]  03/19/2023, 11:53 AM

## 2023-03-20 DIAGNOSIS — I96 Gangrene, not elsewhere classified: Secondary | ICD-10-CM | POA: Diagnosis not present

## 2023-03-20 LAB — CBC
HCT: 43.6 % (ref 39.0–52.0)
Hemoglobin: 14.8 g/dL (ref 13.0–17.0)
MCH: 30 pg (ref 26.0–34.0)
MCHC: 33.9 g/dL (ref 30.0–36.0)
MCV: 88.3 fL (ref 80.0–100.0)
Platelets: 208 10*3/uL (ref 150–400)
RBC: 4.94 MIL/uL (ref 4.22–5.81)
RDW: 14.8 % (ref 11.5–15.5)
WBC: 5.9 10*3/uL (ref 4.0–10.5)
nRBC: 0 % (ref 0.0–0.2)

## 2023-03-20 MED ORDER — CHLORHEXIDINE GLUCONATE 0.12 % MT SOLN
15.0000 mL | Freq: Three times a day (TID) | OROMUCOSAL | Status: DC
  Administered 2023-03-20 – 2023-06-08 (×151): 15 mL via OROMUCOSAL
  Filled 2023-03-20 (×182): qty 15

## 2023-03-20 NOTE — Progress Notes (Signed)
CSW received call from patient stating he was supposed to be seen by a dentist today and wanted to know when because he has not eaten in 12 hours.  CSW spoke with MD who states he has consulted dental to evaluate patient's needs.  Edwin Dada, MSW, [name] Transitions of Care  Clinical Social Worker II [phone]

## 2023-03-20 NOTE — Progress Notes (Signed)
Pt refused Levanox, Melatonin, Xonepax, pt educated, no evidence of learning/needs reinforcement. Provider notified.

## 2023-03-20 NOTE — Progress Notes (Signed)
Dr. Tresa Endo for PROGRESS NOTE [name]  RUE:[id number] DOB: 1966-01-07 DOA: 12/07/2022 PCP: [name], NP  Brief Narrative/Hospital Course: [name] is a 57 y.o. 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  Subsequently stable mobilizing in wheelchair    Chronic combined systolic and diastolic heart failure: Remains euvolemic and compensated.   Cont home Lasix Coreg hydralazine Imdur.  Gum swelling Round soft tissue lesion at anterolateral left aspect of roof 2x1.4x1.7 cm: concerned abt periapical abscess  Dr Mia Creek  consulted 4/17- I spoke w/ him, again on 03/19/23 and discussed >,he reviewed the CT maxillofacial imaging and he has arranged Dr [name] to see him- sent message to Dr Mia Creek and Dr [name].Await oral surgery eval. Patient wants to  eat now. He refused iv unasyn- cont on Augmentin. He reprorts "it has busted open", no fever or leucocytosis, cbc pending today  Essential hypertension:stable on Coreg, amlodipine, Imdur.    COPD:Not wheezing continue Dulera twice daily albuterol, also on BID neb per request   Anxiety:Mood is stable, on Atarax as needed.  GERD:Continue Protonix.   Constipation cont MiraLAX , Dulcolax PRN.   Chronic pain on MS Contin 30 mg BID and oxycodone 20 milligram every 4 hourly. Will need direct supervision while taking pills since there was some concerns for hoarding the pills.   Schizoaffective disorder: Mood is stable, seen by psychiatry during hospitalization.  Continue Abilify and Lexapro.     Oral thrush:Continue nystatin swish and swallow  DVT prophylaxis: enoxaparin (LOVENOX) injection 40 mg Start: 02/25/23 2200 SCD's Start: 12/10/22 1731 Code Status:   Code Status: Full Code Family Communication: plan of care discussed with patient at bedside. Patient status is: Inpatient because of unsafe disposition Level of care: Med-Surg   Dispo: The patient is from: home            Anticipated disposition: SNF-difficult disposition unable to obtain a skilled nursing facility due to drug history.  Currently working on housing which has been approved.  Discussed with TOC today  Objective: Vitals last 24 hrs: Vitals:   03/19/23 1928 03/20/23 0500 03/20/23 0755 03/20/23 0829  BP: 127/69  (!) 148/99   Pulse: 63  62 62  Resp: 18  18 18   Temp: 97.9 F (36.6 C)  98 F (36.7 C)   TempSrc: Oral  Oral   SpO2: 98%  96% 96%  Weight:  88.2 kg    Height:       Weight change:   Physical  Examination: General exam: AAox3, weak,older appearing HEENT:Oral mucosa moist, Ear/Nose WNL grossly, dentition normal. Respiratory system: bilaterally clear BS,no use of accessory muscle Cardiovascular system: S1 & S2 +, regular rate. Gastrointestinal system: Abdomen soft, NT,ND,BS+ Nervous System:Alert, awake,  moving extremities and grossly nonfocal Extremities: B/L AKA Skin: No rashes,no icterus. MSK: Normal muscle bulk,tone, power   Medications reviewed:  Scheduled Meds:  amLODipine  10 mg Oral Daily   amoxicillin-clavulanate  1 tablet Oral Q12H   ARIPiprazole  5 mg Oral Daily   vitamin C  1,000 mg Oral Daily   carvedilol  3.125 mg Oral BID WC   collagenase   Topical Daily   enoxaparin (LOVENOX) injection  40 mg Subcutaneous Q24H   escitalopram  5 mg Oral Daily   furosemide  40 mg Oral Daily   gabapentin  900 mg Oral TID   isosorbide mononitrate  15 mg Oral Daily   levalbuterol  0.63 mg Nebulization BID   melatonin  3 mg Oral QHS   mometasone-formoterol  2 puff Inhalation BID   morphine  30 mg Oral Q12H   multivitamin with minerals  1 tablet Oral Daily   nystatin  5 mL Oral QID   pantoprazole  40 mg Oral Daily   thiamine  100 mg Oral Daily   Continuous Infusions:  magnesium sulfate bolus IVPB      Diet Order             Diet NPO time specified  Diet effective midnight                  Intake/Output Summary (Last 24 hours) at 03/20/2023 0901 Last data filed at 03/20/2023 0500 Gross per 24 hour  Intake 360 ml  Output 1050 ml  Net -690 ml   Net IO Since Admission: -42,730.28 mL [03/20/23 0901]  Wt Readings from Last 3 Encounters:  03/20/23 88.2 kg  12/03/22 83.1 kg  11/03/22 79 kg   Unresulted Labs (From admission, onward)     Start     Ordered   03/04/23 0500  Creatinine, serum  (enoxaparin (LOVENOX)    CrCl >/= 30 ml/min)  Weekly,   R     Comments: while on enoxaparin therapy   Question:  Specimen collection method  Answer:  Lab=Lab collect   02/25/23 1637          Data Reviewed: I have personally reviewed following labs and imaging studies CBC: Recent Labs  Lab 03/18/23 0943  WBC 6.2  HGB 14.8  HCT 43.1  MCV 87.8  PLT 207    Basic Metabolic Panel: Recent Labs  Lab 03/18/23 0943  NA 139  K 3.8  CL 106  CO2 23  GLUCOSE 163*  BUN 15   CREATININE 0.92  CALCIUM 9.3   Antimicrobials: Anti-infectives (From admission, onward)    Start     Dose/Rate Route Frequency Ordered Stop   03/19/23 0900  amoxicillin-clavulanate (AUGMENTIN) 875-125 MG per tablet 1 tablet        1 tablet Oral Every 12 hours 03/19/23 0809     03/18/23 2000  Ampicillin-Sulbactam (UNASYN) 3 g in sodium chloride 0.9 % 100 mL IVPB  Status:  Discontinued        3 g 200 mL/hr over 30 Minutes Intravenous Every 6 hours 03/18/23 1939 03/19/23 0809   03/18/23 1645  Ampicillin-Sulbactam (UNASYN) 3 g in sodium chloride 0.9 % 100 mL IVPB  Status:  Discontinued        3 g 200  mL/hr over 30 Minutes Intravenous Every 6 hours 03/18/23 1555 03/18/23 1939   12/26/22 2200  doxycycline (VIBRA-TABS) tablet 100 mg        100 mg Oral Every 12 hours 12/26/22 1307 01/08/23 2126   12/24/22 1000  doxycycline (VIBRA-TABS) tablet 100 mg  Status:  Discontinued        100 mg Oral Every 12 hours 12/24/22 0715 12/26/22 1307   12/10/22 1830  ceFAZolin (ANCEF) IVPB 2g/100 mL premix  Status:  Discontinued        2 g 200 mL/hr over 30 Minutes Intravenous Every 8 hours 12/10/22 1731 12/10/22 1740   12/10/22 1330  ceFAZolin (ANCEF) IVPB 2g/100 mL premix        2 g 200 mL/hr over 30 Minutes Intravenous On call to O.R. 12/10/22 0959 12/10/22 1534   12/08/22 0530  vancomycin (VANCOREADY) IVPB 1500 mg/300 mL        1,500 mg 150 mL/hr over 120 Minutes Intravenous Every 12 hours 12/07/22 1741 12/11/22 0955   12/08/22 0345  metroNIDAZOLE (FLAGYL) IVPB 500 mg        500 mg 100 mL/hr over 60 Minutes Intravenous Every 12 hours 12/07/22 1742 12/12/22 0344   12/07/22 2330  ceFEPIme (MAXIPIME) 2 g in sodium chloride 0.9 % 100 mL IVPB        2 g 200 mL/hr over 30 Minutes Intravenous Every 8 hours 12/07/22 1741 12/11/22 1529   12/07/22 2200  metroNIDAZOLE (FLAGYL) IVPB 500 mg  Status:  Discontinued        500 mg 100 mL/hr over 60 Minutes Intravenous Every 12 hours 12/07/22 2048 12/07/22 2056    12/07/22 1530  ceFEPIme (MAXIPIME) 2 g in sodium chloride 0.9 % 100 mL IVPB        2 g 200 mL/hr over 30 Minutes Intravenous  Once 12/07/22 1516 12/07/22 1734   12/07/22 1530  vancomycin (VANCOREADY) IVPB 1500 mg/300 mL        1,500 mg 150 mL/hr over 120 Minutes Intravenous  Once 12/07/22 1516 12/09/22 0333   12/07/22 1515  metroNIDAZOLE (FLAGYL) IVPB 500 mg        500 mg 100 mL/hr over 60 Minutes Intravenous  Once 12/07/22 1506 12/07/22 1734   12/07/22 1500  ceFAZolin (ANCEF) IVPB 1 g/50 mL premix  Status:  Discontinued        1 g 100 mL/hr over 30 Minutes Intravenous  Once 12/07/22 1452 12/07/22 1513      Culture/Microbiology    Component Value Date/Time   SDES  12/07/2022 1730    BLOOD RIGHT ANTECUBITAL Performed at [facility], [address] [place], [name] [address]    SDES  12/07/2022 1730    BLOOD SITE NOT SPECIFIED Performed at [facility], [address], [place], [name] [address]    SPECREQUEST  12/07/2022 1730    BOTTLES DRAWN AEROBIC AND ANAEROBIC Blood Culture adequate volume Performed at [facility], [address], [place], [name] [address]    SPECREQUEST  12/07/2022 1730    BOTTLES DRAWN AEROBIC ONLY Blood Culture results may not be optimal due to an excessive volume of blood received in culture bottles Performed at [facility], [address], [place], [name] [address]    CULT  12/07/2022 1730    NO GROWTH 5 DAYS Performed at [facility], [address] [place], [name] [address]    CULT  12/07/2022 1730    NO GROWTH  5 DAYS Performed at [facility], [address], [place], [name] [address]    REPTSTATUS 12/12/2022 FINAL 12/07/2022 1730   REPTSTATUS 12/12/2022 FINAL 12/07/2022 1730   Radiology Studies: CT MAXILLOFACIAL WO CONTRAST  Result Date: 03/18/2023 CLINICAL DATA:  Dental pain EXAM: CT MAXILLOFACIAL WITHOUT CONTRAST TECHNIQUE: Multidetector CT  imaging of the maxillofacial structures was performed. Multiplanar CT image reconstructions were also generated. RADIATION DOSE REDUCTION: This exam was performed according to the departmental dose-optimization program which includes automated exposure control, adjustment of the mA and/or kV according to patient size and/or use of iterative reconstruction technique. COMPARISON:  CTA Head/neck 02/03/22 FINDINGS: Osseous: No acute fracture. Bilateral maxillary mandibular molars are absent. There are large dental caries and periapical lucencies in the bilateral mandibular and maxillary lateral incisors, canines, and premolars. Orbits: Chronic fracture of the left lamina papyracea. Orbits are otherwise unremarkable. Sinuses: Mild mucosal thickening of the floor of bilateral maxillary sinuses. Soft tissues: There is a rounded soft tissue lesion at the roof of the mouth measuring 2.0 x 1.4 x 1.7 cm (series 3, image 34). This is in a region where there is a periapical lucency with cortical breakthrough (series 7, image 35). 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 Rounded soft tissue lesion at the anterolateral left aspect of the roof of the mouth measuring 2.0 x 1.4 x 1.7 cm, which is in a region where there is a periapical lucency with cortical breakthrough around a left lateral incisor. This is favored to represent an odontogenic soft tissue abscess, but an underlying mass is not excluded. Recommend direct visualization. 2. Large dental caries and periapical lucencies in the bilateral mandibular and maxillary lateral incisors, canines, and premolars. Electronically Signed   By: Lorenza Cambridge M.D.   On: 03/18/2023 15:22     LOS: 103 days   Lanae Boast, MD [facility]  03/20/2023, 9:01 AM

## 2023-03-21 DIAGNOSIS — I96 Gangrene, not elsewhere classified: Secondary | ICD-10-CM | POA: Diagnosis not present

## 2023-03-21 MED ORDER — LEVALBUTEROL HCL 0.63 MG/3ML IN NEBU: 0.6300 mg | INHALATION_SOLUTION | Freq: Four times a day (QID) | RESPIRATORY_TRACT | Status: DC | PRN

## 2023-03-21 NOTE — Progress Notes (Signed)
Dr. Tresa [name] for PROGRESS NOTE [name]  ZOX:[id number] DOB: 1965/12/18 DOA: 12/07/2022 PCP: [name] Sharps, NP  Brief Narrative/Hospital Course: [name] is a 57 y.o. 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 Hospitalization complicated by difficulty disposition   Subjective: Seen this am On phone No teeth pain or chewing problem Saw Dr [name] yesterday he sayd Overnight afebrile, his labs still with normal WBC count.  Patient has been refusing Lovenox daily prophylaxis last night.    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  Subsequently stable mobilizing in wheelchair    Chronic combined systolic and diastolic heart failure: Remains euvolemic and compensated.   Cont home Lasix Coreg hydralazine Imdur.  Gum swelling Round soft tissue lesion at anterolateral left aspect of roof 2x1.4x1.7 cm: concerned abt periapical abscess  Dr [name]  consulted 4/17- I spoke w/ him, again on 03/19/23 and discussed >,he reviewed the CT maxillofacial  imaging and he has arranged Dr [name] to see him-I had discussed with Dr. Kenney [name] 4/19> patient reported that he is swelling in the gum has "busted open", he remains on Augmentin we will continue.  Of note he refused Unasyn.  He remains afebrile, and without leukocytosis.  Dr. Kenney [name] has mentioned he is planning for possible operative intervention coming week, he mentioned patienthas had this for some.  Overall he is stable. Patient reports he saw Dr. Kenney [name] 4/19  Essential hypertension:stable on Coreg, amlodipine, Imdur.    COPD:Not wheezing continue Dulera twice daily albuterol, also on BID neb per request   Anxiety:Mood is stable, on Atarax as needed.  GERD:Continue Protonix.   Constipation cont MiraLAX , Dulcolax PRN.   Chronic pain on MS Contin 30 mg BID and oxycodone 20 milligram every 4 hourly. Will need direct supervision while taking pills since there was some concerns for hoarding the pills.   Schizoaffective disorder: Mood is stable, seen by psychiatry during hospitalization.  Continue Abilify and Lexapro.     Oral thrush:Continue nystatin swish and swallow  DVT prophylaxis: enoxaparin (LOVENOX) injection 40 mg Start: 02/25/23 2200 SCD's Start: 12/10/22 1731 Code Status:   Code Status: Full Code Family Communication: plan of care discussed with patient at bedside. Patient status is: Inpatient because of unsafe disposition Level of care: Med-Surg   Dispo: The patient is from: home            Anticipated disposition: SNF-difficult disposition unable to obtain a skilled nursing facility due to drug history.  Currently working on housing which has been approved.  Discussed with TOC today  Objective: Vitals last 24 hrs: Vitals:   03/21/23 0413 03/21/23 0619 03/21/23 0910 03/21/23 0913  BP:  117/71  Pulse:  (!) 58 61   Resp:  17 18   Temp:  97.8 F (36.6 C)    TempSrc:  Oral    SpO2:  98% 99% 99%  Weight: 80 kg     Height:       Weight change: -8.2 kg  Physical  Examination: General exam: AAox3, weak,older appearing HEENT:Oral mucosa moist, Ear/Nose WNL grossly, dentition normal. Respiratory system: bilaterally clear BS, no use of accessory muscle Cardiovascular system: S1 & S2 +, regular rate. Gastrointestinal system: Abdomen soft, NT,ND,BS+ Nervous System:Alert, awake, moving extremities and grossly nonfocal Extremities: b/l old BKA, lower extremities warm Skin: No rashes,no icterus. MSK: Normal muscle bulk,tone, power   Medications reviewed:  Scheduled Meds:  amLODipine  10 mg Oral Daily   amoxicillin-clavulanate  1 tablet Oral Q12H   ARIPiprazole  5 mg Oral Daily   vitamin C  1,000 mg Oral Daily   carvedilol  3.125 mg Oral BID WC   chlorhexidine  15 mL Mouth/Throat TID   collagenase   Topical Daily   enoxaparin (LOVENOX) injection  40 mg Subcutaneous Q24H   escitalopram  5 mg Oral Daily   furosemide  40 mg Oral Daily   gabapentin  900 mg Oral TID   isosorbide mononitrate  15 mg Oral Daily   melatonin  3 mg Oral QHS   mometasone-formoterol  2 puff Inhalation BID   morphine  30 mg Oral Q12H   multivitamin with minerals  1 tablet Oral Daily   nystatin  5 mL Oral QID   pantoprazole  40 mg Oral Daily   thiamine  100 mg Oral Daily   Continuous Infusions:  magnesium sulfate bolus IVPB      Diet Order             Diet regular Room service appropriate? Yes; Fluid consistency: Thin  Diet effective now                  Intake/Output Summary (Last 24 hours) at 03/21/2023 1153 Last data filed at 03/21/2023 0051 Gross per 24 hour  Intake --  Output 450 ml  Net -450 ml  Net IO Since Admission: -43,180.28 mL [03/21/23 1153]  Wt Readings from Last 3 Encounters:  03/21/23 80 kg  12/03/22 83.1 kg  11/03/22 79 kg   Unresulted Labs (From admission, onward)     Start     Ordered   03/04/23 0500  Creatinine, serum  (enoxaparin (LOVENOX)    CrCl >/= 30 ml/min)  Weekly,   R     Comments: while on enoxaparin therapy   Question:   Specimen collection method  Answer:  Lab=Lab collect   02/25/23 1637          Data Reviewed: I have personally reviewed following labs and imaging studies CBC: Recent Labs  Lab 03/18/23 0943 03/20/23 1519  WBC 6.2 5.9  HGB 14.8 14.8  HCT 43.1 43.6  MCV 87.8 88.3  PLT 207 208   Basic Metabolic Panel: Recent Labs  Lab 03/18/23 0943  NA 139  K 3.8  CL 106  CO2 23  GLUCOSE 163*  BUN 15  CREATININE 0.92  CALCIUM 9.3  Antimicrobials: Anti-infectives (From admission, onward)    Start     Dose/Rate Route Frequency Ordered Stop   03/19/23 0900  amoxicillin-clavulanate (AUGMENTIN) 875-125 MG per tablet 1 tablet        1 tablet Oral Every 12 hours 03/19/23 0809     03/18/23 2000  Ampicillin-Sulbactam (UNASYN)  3 g in sodium chloride 0.9 % 100 mL IVPB  Status:  Discontinued        3 g 200 mL/hr over 30 Minutes Intravenous Every 6 hours 03/18/23 1939 03/19/23 0809   03/18/23 1645  Ampicillin-Sulbactam (UNASYN) 3 g in sodium chloride 0.9 % 100 mL IVPB  Status:  Discontinued        3 g 200 mL/hr over 30 Minutes Intravenous Every 6 hours 03/18/23 1555 03/18/23 1939   12/26/22 2200  doxycycline (VIBRA-TABS) tablet 100 mg        100 mg Oral Every 12 hours 12/26/22 1307 01/08/23 2126   12/24/22 1000  doxycycline (VIBRA-TABS) tablet 100 mg  Status:  Discontinued        100 mg Oral Every 12 hours 12/24/22 0715 12/26/22 1307   12/10/22 1830  ceFAZolin (ANCEF) IVPB 2g/100 mL premix  Status:  Discontinued        2 g 200 mL/hr over 30 Minutes Intravenous Every 8 hours 12/10/22 1731 12/10/22 1740   12/10/22 1330  ceFAZolin (ANCEF) IVPB 2g/100 mL premix        2 g 200 mL/hr over 30 Minutes Intravenous On call to O.R. 12/10/22 0959 12/10/22 1534   12/08/22 0530  vancomycin (VANCOREADY) IVPB 1500 mg/300 mL        1,500 mg 150 mL/hr over 120 Minutes Intravenous Every 12 hours 12/07/22 1741 12/11/22 0955   12/08/22 0345  metroNIDAZOLE (FLAGYL) IVPB 500 mg        500 mg 100 mL/hr over 60  Minutes Intravenous Every 12 hours 12/07/22 1742 12/12/22 0344   12/07/22 2330  ceFEPIme (MAXIPIME) 2 g in sodium chloride 0.9 % 100 mL IVPB        2 g 200 mL/hr over 30 Minutes Intravenous Every 8 hours 12/07/22 1741 12/11/22 1529   12/07/22 2200  metroNIDAZOLE (FLAGYL) IVPB 500 mg  Status:  Discontinued        500 mg 100 mL/hr over 60 Minutes Intravenous Every 12 hours 12/07/22 2048 12/07/22 2056   12/07/22 1530  ceFEPIme (MAXIPIME) 2 g in sodium chloride 0.9 % 100 mL IVPB        2 g 200 mL/hr over 30 Minutes Intravenous  Once 12/07/22 1516 12/07/22 1734   12/07/22 1530  vancomycin (VANCOREADY) IVPB 1500 mg/300 mL        1,500 mg 150 mL/hr over 120 Minutes Intravenous  Once 12/07/22 1516 12/09/22 0333   12/07/22 1515  metroNIDAZOLE (FLAGYL) IVPB 500 mg        500 mg 100 mL/hr over 60 Minutes Intravenous  Once 12/07/22 1506 12/07/22 1734   12/07/22 1500  ceFAZolin (ANCEF) IVPB 1 g/50 mL premix  Status:  Discontinued        1 g 100 mL/hr over 30 Minutes Intravenous  Once 12/07/22 1452 12/07/22 1513      Culture/Microbiology    Component Value Date/Time   SDES  12/07/2022 1730    BLOOD RIGHT ANTECUBITAL Performed at [facility], [name] [place], [name] [address]    SDES  12/07/2022 1730    BLOOD SITE NOT SPECIFIED Performed at [facility], [name] [place], [name] [address]    SPECREQUEST  12/07/2022 1730    BOTTLES DRAWN AEROBIC AND ANAEROBIC Blood Culture adequate volume Performed at [facility], [address] [place], [name] [address]    SPECREQUEST  12/07/2022 1730    BOTTLES DRAWN AEROBIC ONLY Blood Culture results  may not be optimal due to an excessive volume of blood received in culture bottles Performed at [facility], [address] [place], [name] [address]    CULT  12/07/2022 1730    NO GROWTH 5 DAYS Performed at [facility], [address]  [place], [name] [address]    CULT  12/07/2022 1730    NO GROWTH 5 DAYS Performed at [facility], [address], [place], [name] [address]    REPTSTATUS 12/12/2022 FINAL 12/07/2022 1730   REPTSTATUS 12/12/2022 FINAL 12/07/2022 1730   Radiology Studies: No results found.   LOS: 104 days   Lanae Boast, MD [facility]  03/21/2023, 11:53 AM

## 2023-03-22 DIAGNOSIS — I96 Gangrene, not elsewhere classified: Secondary | ICD-10-CM | POA: Diagnosis not present

## 2023-03-22 MED ORDER — IPRATROPIUM-ALBUTEROL 0.5-2.5 (3) MG/3ML IN SOLN
RESPIRATORY_TRACT | Status: AC
  Filled 2023-03-22: qty 3

## 2023-03-22 NOTE — H&P (View-Only) (Signed)
Reason for Consult:odontogenic abscess Referring Physician: Dr. Ruben [name] is an 57 y.o. male.  HPI: [name] is a 57 y.o. 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 Hospitalization complicated by difficulty disposition. OMS consulted due to "gum swelling". Patient complains of "large bump" on the root of his mouth. States that it had popped yesterday. Gums are sore, but not extremely painful per report. Denies dysnea/dysphagia. C/o "phantam leg pain"  Past Medical History:  Diagnosis Date   Asthma    Bipolar 1 disorder (HCC)    CHF (congestive heart failure) (HCC)    COPD (chronic obstructive pulmonary disease) (HCC)    Depression    Diabetes mellitus without complication (HCC)    GERD (gastroesophageal reflux disease)    Homelessness    Hypertension    QT prolongation    Schizoaffective disorder (HCC)     Past Surgical History:  Procedure Laterality Date   AMPUTATION Left 12/10/2022   Procedure: LEFT BELOW KNEE AMPUTATION;  Surgeon: Nadara Mustard, MD;  Location: [facility] OR;  Service: Orthopedics;  Laterality: Left;   AMPUTATION TOE Bilateral 01/02/2021   Procedure: AMPUTATION TOE;  Surgeon: Park Liter, DPM;  Location: [facility] OR;  Service: Podiatry;  Laterality: Bilateral;  Ok to leave patient on hospital bed   IR ANGIO INTRA EXTRACRAN SEL COM CAROTID INNOMINATE BILAT MOD SED  12/17/2020   IR ANGIO VERTEBRAL SEL VERTEBRAL BILAT MOD SED  12/17/2020   RADIOLOGY WITH ANESTHESIA N/A 12/17/2020   Procedure: IR WITH ANESTHESIA;  Surgeon: Radiologist, Medication, MD;  Location: [facility] OR;  Service: Radiology;  Laterality: N/A;    Family History  Problem Relation Age of  Onset   Drug abuse Mother    Suicidality Mother     Social History:  reports that he has been smoking cigarettes. He has never used smokeless tobacco. He reports current alcohol use. He reports current drug use. Drug: Cocaine.  Allergies:  Allergies  Allergen Reactions   Motrin [Ibuprofen] Anaphylaxis   Tylenol [Acetaminophen] Hives, Nausea And Vomiting and Rash   Lyrica Cr [Pregabalin Er] Other (See Comments)    Dystonia Dysarthria    Peanut-Containing Drug Products Other (See Comments)    Pt denies allergy to peanut butter 03/02/2021    Medications: I have reviewed the patient's current medications.  No results found for this or any previous visit (from the past 48 hour(s)).  No results found.  Review of Systems: other than HPI, neg  Blood pressure 118/62, pulse (!) 57, temperature 97.8 F (36.6 C), temperature source Oral, resp. rate 16, height  (1.753 m), weight 80 kg, SpO2 99 %. Physical Exam Gen: awake, alert, in wheelchair, nad HEENT: no apparent facial edema, poor dentition - multiple carious teeth. Mod edema of the anterior palate, compressible, tender to palpation. Retained root fragments in the bilateral max/mand posterior areas.   CT MAXILLOFACIAL WITHOUT CONTRAST   TECHNIQUE: Multidetector CT imaging of the maxillofacial structures was performed. Multiplanar CT image reconstructions were also generated.   RADIATION DOSE REDUCTION: This exam was performed according to the departmental dose-optimization program which includes automated exposure control, adjustment of the mA and/or kV according to patient size and/or use of iterative reconstruction technique.   COMPARISON:  CTA Head/neck 02/03/22   FINDINGS: Osseous: No acute  fracture. Bilateral maxillary mandibular molars are absent. There are large dental caries and periapical lucencies in the bilateral mandibular and maxillary lateral incisors, canines, and premolars.   Orbits: Chronic fracture of the  left lamina papyracea. Orbits are otherwise unremarkable.   Sinuses: Mild mucosal thickening of the floor of bilateral maxillary sinuses.   Soft tissues: There is a rounded soft tissue lesion at the roof of the mouth measuring 2.0 x 1.4 x 1.7 cm (series 3, image 34). This is in a region where there is a periapical lucency with cortical breakthrough (series 7, image 35).   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 Rounded soft tissue lesion at the anterolateral left aspect of the roof of the mouth measuring 2.0 x 1.4 x 1.7 cm, which is in a region where there is a periapical lucency with cortical breakthrough around a left lateral incisor. This is favored to represent an odontogenic soft tissue abscess, but an underlying mass is not excluded. Recommend direct visualization. 2. Large dental caries and periapical lucencies in the bilateral mandibular and maxillary lateral incisors, canines, and premolars.  Assessment/Plan: 57 y/o male with non-restorable dentition with abscessed teeth; maxillary palatal abscess. Recommend I&D maxillary abscess, possible biopsy, surgical removal of remaining teeth/root fragments, alveoloplasty. Planning for procedure under general anesthesia on Wednesday, Apr 24. Patient will need to be NPO 8 hours prior to procedure. R/B/A discussed with patient.  Vivia Ewing, DMD Oral & Maxillofacial Surgery

## 2023-03-22 NOTE — Progress Notes (Signed)
Dr. Tresa Endo for PROGRESS NOTE [name]  ZOX:[id number] DOB: 10/10/1966 DOA: 12/07/2022 PCP: [name], NP  Brief Narrative/Hospital Course: [name] is a 57 y.o. 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 Hospitalization complicated by difficulty disposition   Subjective:  Seen examined this morning is resting comfortably on his bedside wheelchair, has no complaints, reports mild soreness on gum, but able to chew well swallow well, no fever or chills.    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  Subsequently stable mobilizing in wheelchair    Chronic combined systolic and diastolic heart failure: Remains euvolemic and compensated.   Cont home Lasix Coreg hydralazine Imdur.  Gum swelling Round soft tissue lesion at anterolateral left aspect of roof 2x1.4x1.7 cm: concerned abt periapical abscess  Dr Mia Creek  consulted> eh spoke w/ Dr [name] and I discussed with him- he was kind enough to see him on 4/19> planning for dental  extraction/abscess drainage coming week.  Continue Augmentin p.o. for now, patient reports the abscess has busted open and draining.  He has refused IV Unasyn despite multiple counseling as she does not want iv line.  Essential hypertension:stable on Coreg, amlodipine, Imdur.    COPD:Not wheezing continue Dulera twice daily albuterol, also on BID neb per request   Anxiety:Mood is stable, on Atarax as needed.  GERD:Continue Protonix.   Constipation cont MiraLAX , Dulcolax PRN.   Chronic pain on MS Contin 30 mg BID and oxycodone 20 milligram every 4 hourly. Will need direct supervision while taking pills since there was some concerns for hoarding the pills.   Schizoaffective disorder: Mood is stable, seen by psychiatry during hospitalization.  Continue Abilify and Lexapro.     Oral thrush:Continue nystatin swish and swallow  DVT prophylaxis: enoxaparin (LOVENOX) injection 40 mg Start: 02/25/23 2200 SCD's Start: 12/10/22 1731 Code Status:   Code Status: Full Code Family Communication: plan of care discussed with patient at bedside. Patient status is: Inpatient because of unsafe disposition Level of care: Med-Surg   Dispo: The patient is from: home            Anticipated disposition: SNF-difficult disposition unable to obtain a skilled nursing facility due to drug history.  Currently working on housing which has been approved.  Discussed with TOC today  Objective: Vitals last 24 hrs: Vitals:   03/21/23 0913 03/21/23 1942 03/22/23 0848 03/22/23 0924  BP:  125/69  127/79  Pulse:    74  Resp:  18  18  Temp:  97.8 F (36.6 C)  97.8 F (36.6 C)  TempSrc:  Oral  Oral  SpO2: 99% 97% 98% 100%  Weight:  Height:       Weight change:   Physical Examination: General exam: AAOX3, weak,older appearing HEENT:Oral mucosa moist, Ear/Nose WNL grossly, dentition normal. Respiratory system: bilaterally CLEAR BS, no use of accessory muscle Cardiovascular system: S1 & S2 +, regular  rate,. Gastrointestinal system: Abdomen soft, NT,ND,BS+ Nervous System:Alert, awake, moving extremities and grossly nonfocal Extremities: b/l BKA Skin: No rashes,no icterus. MSK: Normal muscle bulk,tone, power   Medications reviewed:  Scheduled Meds:  amLODipine  10 mg Oral Daily   amoxicillin-clavulanate  1 tablet Oral Q12H   ARIPiprazole  5 mg Oral Daily   vitamin C  1,000 mg Oral Daily   carvedilol  3.125 mg Oral BID WC   chlorhexidine  15 mL Mouth/Throat TID   collagenase   Topical Daily   enoxaparin (LOVENOX) injection  40 mg Subcutaneous Q24H   escitalopram  5 mg Oral Daily   furosemide  40 mg Oral Daily   gabapentin  900 mg Oral TID   isosorbide mononitrate  15 mg Oral Daily   melatonin  3 mg Oral QHS   mometasone-formoterol  2 puff Inhalation BID   morphine  30 mg Oral Q12H   multivitamin with minerals  1 tablet Oral Daily   nystatin  5 mL Oral QID   pantoprazole  40 mg Oral Daily   thiamine  100 mg Oral Daily   Continuous Infusions:  magnesium sulfate bolus IVPB      Diet Order             Diet regular Room service appropriate? Yes; Fluid consistency: Thin  Diet effective now                  Intake/Output Summary (Last 24 hours) at 03/22/2023 1127 Last data filed at 03/22/2023 0610 Gross per 24 hour  Intake --  Output 750 ml  Net -750 ml   Net IO Since Admission: -43,930.28 mL [03/22/23 1127]  Wt Readings from Last 3 Encounters:  03/21/23 80 kg  12/03/22 83.1 kg  11/03/22 79 kg   Unresulted Labs (From admission, onward)     Start     Ordered   03/04/23 0500  Creatinine, serum  (enoxaparin (LOVENOX)    CrCl >/= 30 ml/min)  Weekly,   R     Comments: while on enoxaparin therapy   Question:  Specimen collection method  Answer:  Lab=Lab collect   02/25/23 1637          Data Reviewed: I have personally reviewed following labs and imaging studies CBC: Recent Labs  Lab 03/18/23 0943 03/20/23 1519  WBC 6.2 5.9  HGB 14.8 14.8  HCT 43.1 43.6   MCV 87.8 88.3  PLT 207 208    Basic Metabolic Panel: Recent Labs  Lab 03/18/23 0943  NA 139  K 3.8  CL 106  CO2 23  GLUCOSE 163*  BUN 15  CREATININE 0.92  CALCIUM 9.3   Antimicrobials: Anti-infectives (From admission, onward)    Start     Dose/Rate Route Frequency Ordered Stop   03/19/23 0900  amoxicillin-clavulanate (AUGMENTIN) 875-125 MG per tablet 1 tablet        1 tablet Oral Every 12 hours 03/19/23 0809     03/18/23 2000  Ampicillin-Sulbactam (UNASYN) 3 g in sodium chloride 0.9 % 100 mL IVPB  Status:  Discontinued        3 g 200 mL/hr over 30 Minutes Intravenous Every 6 hours 03/18/23 1939 03/19/23 0809   03/18/23 1645  Ampicillin-Sulbactam (UNASYN)  3 g in sodium chloride 0.9 % 100 mL IVPB  Status:  Discontinued        3 g 200 mL/hr over 30 Minutes Intravenous Every 6 hours 03/18/23 1555 03/18/23 1939   12/26/22 2200  doxycycline (VIBRA-TABS) tablet 100 mg        100 mg Oral Every 12 hours 12/26/22 1307 01/08/23 2126   12/24/22 1000  doxycycline (VIBRA-TABS) tablet 100 mg  Status:  Discontinued        100 mg Oral Every 12 hours 12/24/22 0715 12/26/22 1307   12/10/22 1830  ceFAZolin (ANCEF) IVPB 2g/100 mL premix  Status:  Discontinued        2 g 200 mL/hr over 30 Minutes Intravenous Every 8 hours 12/10/22 1731 12/10/22 1740   12/10/22 1330  ceFAZolin (ANCEF) IVPB 2g/100 mL premix        2 g 200 mL/hr over 30 Minutes Intravenous On call to O.R. 12/10/22 0959 12/10/22 1534   12/08/22 0530  vancomycin (VANCOREADY) IVPB 1500 mg/300 mL        1,500 mg 150 mL/hr over 120 Minutes Intravenous Every 12 hours 12/07/22 1741 12/11/22 0955   12/08/22 0345  metroNIDAZOLE (FLAGYL) IVPB 500 mg        500 mg 100 mL/hr over 60 Minutes Intravenous Every 12 hours 12/07/22 1742 12/12/22 0344   12/07/22 2330  ceFEPIme (MAXIPIME) 2 g in sodium chloride 0.9 % 100 mL IVPB        2 g 200 mL/hr over 30 Minutes Intravenous Every 8 hours 12/07/22 1741 12/11/22 1529   12/07/22 2200   metroNIDAZOLE (FLAGYL) IVPB 500 mg  Status:  Discontinued        500 mg 100 mL/hr over 60 Minutes Intravenous Every 12 hours 12/07/22 2048 12/07/22 2056   12/07/22 1530  ceFEPIme (MAXIPIME) 2 g in sodium chloride 0.9 % 100 mL IVPB        2 g 200 mL/hr over 30 Minutes Intravenous  Once 12/07/22 1516 12/07/22 1734   12/07/22 1530  vancomycin (VANCOREADY) IVPB 1500 mg/300 mL        1,500 mg 150 mL/hr over 120 Minutes Intravenous  Once 12/07/22 1516 12/09/22 0333   12/07/22 1515  metroNIDAZOLE (FLAGYL) IVPB 500 mg        500 mg 100 mL/hr over 60 Minutes Intravenous  Once 12/07/22 1506 12/07/22 1734   12/07/22 1500  ceFAZolin (ANCEF) IVPB 1 g/50 mL premix  Status:  Discontinued        1 g 100 mL/hr over 30 Minutes Intravenous  Once 12/07/22 1452 12/07/22 1513      Culture/Microbiology    Component Value Date/Time   SDES  12/07/2022 1730    BLOOD RIGHT ANTECUBITAL Performed at [facility], [address] [place], [name] [address]    SDES  12/07/2022 1730    BLOOD SITE NOT SPECIFIED Performed at [facility], [address] [place], [name] [address]    SPECREQUEST  12/07/2022 1730    BOTTLES DRAWN AEROBIC AND ANAEROBIC Blood Culture adequate volume Performed at [facility], [address] [place], [name] [address]    SPECREQUEST  12/07/2022 1730    BOTTLES DRAWN AEROBIC ONLY Blood Culture results may not be optimal due to an excessive volume of blood received in culture bottles Performed at [facility], [address], [place], [name] [address]    CULT  12/07/2022 1730    NO GROWTH 5 DAYS Performed  at [facility], [address] [place], [name] [address]    CULT  12/07/2022 1730    NO GROWTH 5 DAYS Performed at [facility], [address] [place], [name] [address]    REPTSTATUS 12/12/2022 FINAL 12/07/2022 1730   REPTSTATUS 12/12/2022 FINAL 12/07/2022 1730   Radiology  Studies: No results found.   LOS: 105 days   Lanae Boast, MD [facility]  03/22/2023, 11:27 AM

## 2023-03-22 NOTE — Consult Note (Signed)
Reason for Consult:odontogenic abscess Referring Physician: Dr. Ruben [name] is an 57 y.o. male.  HPI: [name] is a 57 y.o. 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 Hospitalization complicated by difficulty disposition. OMS consulted due to "gum swelling". Patient complains of "large bump" on the root of his mouth. States that it had popped yesterday. Gums are sore, but not extremely painful per report. Denies dysnea/dysphagia. C/o "phantam leg pain"  Past Medical History:  Diagnosis Date   Asthma    Bipolar 1 disorder (HCC)    CHF (congestive heart failure) (HCC)    COPD (chronic obstructive pulmonary disease) (HCC)    Depression    Diabetes mellitus without complication (HCC)    GERD (gastroesophageal reflux disease)    Homelessness    Hypertension    QT prolongation    Schizoaffective disorder (HCC)     Past Surgical History:  Procedure Laterality Date   AMPUTATION Left 12/10/2022   Procedure: LEFT BELOW KNEE AMPUTATION;  Surgeon: Nadara Mustard, MD;  Location: [facility] OR;  Service: Orthopedics;  Laterality: Left;   AMPUTATION TOE Bilateral 01/02/2021   Procedure: AMPUTATION TOE;  Surgeon: Park Liter, DPM;  Location: [facility] OR;  Service: Podiatry;  Laterality: Bilateral;  Ok to leave patient on hospital bed   IR ANGIO INTRA EXTRACRAN SEL COM CAROTID INNOMINATE BILAT MOD SED  12/17/2020   IR ANGIO VERTEBRAL SEL VERTEBRAL BILAT MOD SED  12/17/2020   RADIOLOGY WITH ANESTHESIA N/A 12/17/2020   Procedure: IR WITH ANESTHESIA;  Surgeon: Radiologist, Medication, MD;  Location: [facility] OR;  Service: Radiology;  Laterality: N/A;    Family History  Problem Relation Age of  Onset   Drug abuse Mother    Suicidality Mother     Social History:  reports that he has been smoking cigarettes. He has never used smokeless tobacco. He reports current alcohol use. He reports current drug use. Drug: Cocaine.  Allergies:  Allergies  Allergen Reactions   Motrin [Ibuprofen] Anaphylaxis   Tylenol [Acetaminophen] Hives, Nausea And Vomiting and Rash   Lyrica Cr [Pregabalin Er] Other (See Comments)    Dystonia Dysarthria    Peanut-Containing Drug Products Other (See Comments)    Pt denies allergy to peanut butter 03/02/2021    Medications: I have reviewed the patient's current medications.  No results found for this or any previous visit (from the past 48 hour(s)).  No results found.  Review of Systems: other than HPI, neg  Blood pressure 118/62, pulse (!) 57, temperature 97.8 F (36.6 C), temperature source Oral, resp. rate 16, height  (1.753 m), weight 80 kg, SpO2 99 %. Physical Exam Gen: awake, alert, in wheelchair, nad HEENT: no apparent facial edema, poor dentition - multiple carious teeth. Mod edema of the anterior palate, compressible, tender to palpation. Retained root fragments in the bilateral max/mand posterior areas.   CT MAXILLOFACIAL WITHOUT CONTRAST   TECHNIQUE: Multidetector CT imaging of the maxillofacial structures was performed. Multiplanar CT image reconstructions were also generated.   RADIATION DOSE REDUCTION: This exam was performed according to the departmental dose-optimization program which includes automated exposure control, adjustment of the mA and/or kV according to patient size and/or use of iterative reconstruction technique.   COMPARISON:  CTA Head/neck 02/03/22   FINDINGS: Osseous: No acute  fracture. Bilateral maxillary mandibular molars are absent. There are large dental caries and periapical lucencies in the bilateral mandibular and maxillary lateral incisors, canines, and premolars.   Orbits: Chronic fracture of the  left lamina papyracea. Orbits are otherwise unremarkable.   Sinuses: Mild mucosal thickening of the floor of bilateral maxillary sinuses.   Soft tissues: There is a rounded soft tissue lesion at the roof of the mouth measuring 2.0 x 1.4 x 1.7 cm (series 3, image 34). This is in a region where there is a periapical lucency with cortical breakthrough (series 7, image 35).   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 Rounded soft tissue lesion at the anterolateral left aspect of the roof of the mouth measuring 2.0 x 1.4 x 1.7 cm, which is in a region where there is a periapical lucency with cortical breakthrough around a left lateral incisor. This is favored to represent an odontogenic soft tissue abscess, but an underlying mass is not excluded. Recommend direct visualization. 2. Large dental caries and periapical lucencies in the bilateral mandibular and maxillary lateral incisors, canines, and premolars.  Assessment/Plan: 56 y/o male with non-restorable dentition with abscessed teeth; maxillary palatal abscess. Recommend I&D maxillary abscess, possible biopsy, surgical removal of remaining teeth/root fragments, alveoloplasty. Planning for procedure under general anesthesia on Wednesday, Apr 24. Patient will need to be NPO 8 hours prior to procedure. R/B/A discussed with patient.  Sundee Garland, DMD Oral & Maxillofacial Surgery 

## 2023-03-23 DIAGNOSIS — I96 Gangrene, not elsewhere classified: Secondary | ICD-10-CM | POA: Diagnosis not present

## 2023-03-23 NOTE — Plan of Care (Signed)
  Problem: Coping: Goal: Level of anxiety will decrease Outcome: Progressing   Problem: Elimination: Goal: Will not experience complications related to bowel motility Outcome: Progressing Goal: Will not experience complications related to urinary retention Outcome: Progressing   

## 2023-03-23 NOTE — Progress Notes (Signed)
Dr. Tresa Endo for PROGRESS NOTE [name]  ZOX:[id number] DOB: 1966/07/15 DOA: 12/07/2022 PCP: [name], NP  Brief Narrative/Hospital Course: [name] is a 57 y.o. 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 Hospitalization complicated by difficulty disposition   Subjective: Patient seen and examined. Denies any dental pain No new complaints Past 24 hours he has been afebrile Blood pressure holding 120s to 160s systolic.  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  Subsequently stable mobilizing in wheelchair    Chronic combined systolic and diastolic heart failure: Remains euvolemic and compensated.   Cont home Lasix Coreg hydralazine Imdur.  Gum swelling w/ Round soft tissue lesion at anterolateral left aspect of roof 2x1.4x1.7 cm concerned abt periapical abscess:  Dr Mia Creek  consulted> he spoke w/ Dr [name] and I discussed with him as well last week> he was kind enough to see him on 4/19> planning for dental extraction/abscess drainage  coming week.Continue Augmentin p.o. for now, patient reports the abscess has ":busted open" and draining.He has refused IV Unasyn despite multiple counseling as she does not want iv line.  Essential hypertension: At times borderline controlled but mostly stable, continue Coreg, amlodipine, Imdur.    COPD:Not wheezing continue Dulera twice daily albuterol, also on BID neb per request   Anxiety:Mood is stable, on Atarax as needed.  GERD:Continue Protonix.   Constipation cont MiraLAX , Dulcolax PRN.   Chronic pain on MS Contin 30 mg BID and oxycodone 20 milligram every 4 hourly. Will need direct supervision while taking pills since there was some concerns for hoarding the pills.   Schizoaffective disorder: Mood is stable, seen by psychiatry during hospitalization.  Continue Abilify and Lexapro.     Oral thrush:Continue nystatin swish and swallow.  DVT prophylaxis: enoxaparin (LOVENOX) injection 40 mg Start: 02/25/23 2200 SCD's Start: 12/10/22 1731 Patient has refused to take Lovenox despite multiple counseling> but he moves around on the wheelchair most of the day Code Status:   Code Status: Full Code Family Communication: plan of care discussed with patient at bedside. Patient status is: Inpatient because of unsafe disposition Level of care: Med-Surg   Dispo: The patient is from: home            Anticipated disposition: SNF-difficult disposition unable to obtain a skilled nursing facility due to drug history.  Currently working on housing which has been approved.  Discussed with TOC today  Objective: Vitals last 24 hrs: Vitals:   03/22/23 0848 03/22/23 0924 03/22/23 2036 03/23/23 0759  BP:  127/79 118/62 (!) 161/89  Pulse:  74 (!) 57 (!) 58  Resp:  Temp:  97.8 F (36.6 C) 97.8  F (36.6 C) 97.8 F (36.6 C)  TempSrc:  Oral Oral Oral  SpO2: 98% 100% 99% 100%  Weight:      Height:       Weight change:   Physical Examination: General exam: AAox3, weak,older  appearing HEENT:Oral mucosa moist, Ear/Nose WNL grossly, dentition normal. Respiratory system: bilaterally clear BS, no use of accessory muscle Cardiovascular system: S1 & S2 +, regular rate. Gastrointestinal system: Abdomen soft, NT,ND,BS+ Nervous System:Alert, awake, moving extremities and grossly nonfocal Extremities: b/l old BKA. Skin: No rashes,no icterus. ZOX:WRUEAV muscle bulk,tone, power   Medications reviewed:  Scheduled Meds:  amLODipine  10 mg Oral Daily   amoxicillin-clavulanate  1 tablet Oral Q12H   ARIPiprazole  5 mg Oral Daily   vitamin C  1,000 mg Oral Daily   carvedilol  3.125 mg Oral BID WC   chlorhexidine  15 mL Mouth/Throat TID   collagenase   Topical Daily   enoxaparin (LOVENOX) injection  40 mg Subcutaneous Q24H   escitalopram  5 mg Oral Daily   furosemide  40 mg Oral Daily   gabapentin  900 mg Oral TID   isosorbide mononitrate  15 mg Oral Daily   melatonin  3 mg Oral QHS   mometasone-formoterol  2 puff Inhalation BID   morphine  30 mg Oral Q12H   multivitamin with minerals  1 tablet Oral Daily   nystatin  5 mL Oral QID   pantoprazole  40 mg Oral Daily   thiamine  100 mg Oral Daily   Continuous Infusions:  magnesium sulfate bolus IVPB      Diet Order             Diet regular Room service appropriate? Yes; Fluid consistency: Thin  Diet effective now                  Intake/Output Summary (Last 24 hours) at 03/23/2023 0844 Last data filed at 03/23/2023 0030 Gross per 24 hour  Intake 244 ml  Output --  Net 244 ml   Net IO Since Admission: -43,686.28 mL [03/23/23 0844]  Wt Readings from Last 3 Encounters:  03/21/23 80 kg  12/03/22 83.1 kg  11/03/22 79 kg   Unresulted Labs (From admission, onward)     Start     Ordered   03/04/23 0500  Creatinine, serum  (enoxaparin (LOVENOX)    CrCl >/= 30 ml/min)  Weekly,   R     Comments: while on enoxaparin therapy   Question:  Specimen collection method  Answer:  Lab=Lab collect   02/25/23 1637           Data Reviewed: I have personally reviewed following labs and imaging studies CBC: Recent Labs  Lab 03/18/23 0943 03/20/23 1519  WBC 6.2 5.9  HGB 14.8 14.8  HCT 43.1 43.6  MCV 87.8 88.3  PLT 207 208    Basic Metabolic Panel: Recent Labs  Lab 03/18/23 0943  NA 139  K 3.8  CL 106  CO2 23  GLUCOSE 163*  BUN 15  CREATININE 0.92  CALCIUM 9.3   Antimicrobials: Anti-infectives (From admission, onward)    Start     Dose/Rate Route Frequency Ordered Stop   03/19/23 0900  amoxicillin-clavulanate (AUGMENTIN) 875-125 MG per tablet 1 tablet        1 tablet Oral Every 12 hours 03/19/23 0809     03/18/23 2000  Ampicillin-Sulbactam (UNASYN) 3 g in sodium chloride 0.9 % 100 mL IVPB  Status:  Discontinued  3 g 200 mL/hr over 30 Minutes Intravenous Every 6 hours 03/18/23 1939 03/19/23 0809   03/18/23 1645  Ampicillin-Sulbactam (UNASYN) 3 g in sodium chloride 0.9 % 100 mL IVPB  Status:  Discontinued        3 g 200 mL/hr over 30 Minutes Intravenous Every 6 hours 03/18/23 1555 03/18/23 1939   12/26/22 2200  doxycycline (VIBRA-TABS) tablet 100 mg        100 mg Oral Every 12 hours 12/26/22 1307 01/08/23 2126   12/24/22 1000  doxycycline (VIBRA-TABS) tablet 100 mg  Status:  Discontinued        100 mg Oral Every 12 hours 12/24/22 0715 12/26/22 1307   12/10/22 1830  ceFAZolin (ANCEF) IVPB 2g/100 mL premix  Status:  Discontinued        2 g 200 mL/hr over 30 Minutes Intravenous Every 8 hours 12/10/22 1731 12/10/22 1740   12/10/22 1330  ceFAZolin (ANCEF) IVPB 2g/100 mL premix        2 g 200 mL/hr over 30 Minutes Intravenous On call to O.R. 12/10/22 0959 12/10/22 1534   12/08/22 0530  vancomycin (VANCOREADY) IVPB 1500 mg/300 mL        1,500 mg 150 mL/hr over 120 Minutes Intravenous Every 12 hours 12/07/22 1741 12/11/22 0955   12/08/22 0345  metroNIDAZOLE (FLAGYL) IVPB 500 mg        500 mg 100 mL/hr over 60 Minutes Intravenous Every 12 hours 12/07/22 1742 12/12/22 0344    12/07/22 2330  ceFEPIme (MAXIPIME) 2 g in sodium chloride 0.9 % 100 mL IVPB        2 g 200 mL/hr over 30 Minutes Intravenous Every 8 hours 12/07/22 1741 12/11/22 1529   12/07/22 2200  metroNIDAZOLE (FLAGYL) IVPB 500 mg  Status:  Discontinued        500 mg 100 mL/hr over 60 Minutes Intravenous Every 12 hours 12/07/22 2048 12/07/22 2056   12/07/22 1530  ceFEPIme (MAXIPIME) 2 g in sodium chloride 0.9 % 100 mL IVPB        2 g 200 mL/hr over 30 Minutes Intravenous  Once 12/07/22 1516 12/07/22 1734   12/07/22 1530  vancomycin (VANCOREADY) IVPB 1500 mg/300 mL        1,500 mg 150 mL/hr over 120 Minutes Intravenous  Once 12/07/22 1516 12/09/22 0333   12/07/22 1515  metroNIDAZOLE (FLAGYL) IVPB 500 mg        500 mg 100 mL/hr over 60 Minutes Intravenous  Once 12/07/22 1506 12/07/22 1734   12/07/22 1500  ceFAZolin (ANCEF) IVPB 1 g/50 mL premix  Status:  Discontinued        1 g 100 mL/hr over 30 Minutes Intravenous  Once 12/07/22 1452 12/07/22 1513      Culture/Microbiology    Component Value Date/Time   SDES  12/07/2022 1730    BLOOD RIGHT ANTECUBITAL Performed at [facility], [address], [place], [name] [address]    SDES  12/07/2022 1730    BLOOD SITE NOT SPECIFIED Performed at [facility], [address], [place], [name] [address]    SPECREQUEST  12/07/2022 1730    BOTTLES DRAWN AEROBIC AND ANAEROBIC Blood Culture adequate volume Performed at [facility], [address] [place], [name] [address]    SPECREQUEST  12/07/2022 1730    BOTTLES DRAWN AEROBIC ONLY Blood Culture results may not be optimal due to an excessive volume of blood received in culture bottles Performed at [facility],  [address] [place], [name] [address]    CULT  12/07/2022 1730    NO GROWTH 5 DAYS Performed at [facility], [address] [place], [name] [address]    CULT  12/07/2022 1730    NO GROWTH 5  DAYS Performed at [facility], [address], [place], [name] [address]    REPTSTATUS 12/12/2022 FINAL 12/07/2022 1730   REPTSTATUS 12/12/2022 FINAL 12/07/2022 1730   Radiology Studies: No results found.   LOS: 106 days   Lanae Boast, MD [facility]  03/23/2023, 8:44 AM

## 2023-03-23 NOTE — Progress Notes (Signed)
Physical Therapy Treatment Patient Details Name: [name] MRN: [id number] DOB: 03-30-1966 Today's Date: 03/23/2023   History of Present Illness Pt is a 57 y/o M who presented 12/17/22 with L ankle cellulitis and underwent L BKA on 1/10. Oral abscesses noted, plan for oral surgery Wed, 4/24; PMH includes asthma, bioplar 1, depression, schizoaffective disorder, CVA, chronic CHF, homelessness, HTN, DM2, cocaine and cannabis use, and R AKA.    PT Comments    Continuing work on functional mobility and activity tolerance;  Session focused on practicing componenets of the floor transfer, with smoother completion of the task of getting self up to a sitting position on a stool than previous session; afterward [name] indicated he felt like his LE muscles worked in a way that they haven't for quite some time;   [name] is continuing to show his independence at wheelchair level; Got himself dressed today  Recommendations for follow up therapy are one component of a multi-disciplinary discharge planning process, led by the attending physician.  Recommendations may be updated based on patient status, additional functional criteria and insurance authorization.  Follow Up Recommendations  Can patient physically be transported by private vehicle:  (If a car transfer is meaningful for pt, can work on)    Assistance Recommended at Discharge PRN  Patient can return home with the following Assist for transportation   Equipment Recommendations  Other (comment) (can consider sliding board, shower seat or tub transfer bench)    Recommendations for Other Services Other (comment) (Recommend starting the Mental/Behavioral Health support through [name] to start while [name] is here in the hospital)     Precautions / Restrictions Precautions Precautions: Fall (Fall risk is present, but minimal) Precaution Comments: R knee disrticulation, L BKA Restrictions Weight Bearing Restrictions: No     Mobility   Bed Mobility                 Patient Response: Cooperative  Transfers Overall transfer level: Modified independent Equipment used: None Transfers: Bed to chair/wheelchair/BSC         Anterior-Posterior transfers: Modified independent (Device/Increase time)   General transfer comment: transfereed wc to mat table and back to wc without assist    Ambulation/Gait                   Set designer Details (indicate cue type and reason): Spends lots of time in the wc throughout the day; cruising hallways  Modified Rankin (Stroke Patients Only)       Balance     Sitting balance-Leahy Scale: Normal                                      Cognition Arousal/Alertness: Awake/alert Behavior During Therapy: WFL for tasks assessed/performed Overall Cognitive Status: Within Functional Limits for tasks assessed (for mobiltiy tasks we worked on)                                 General Comments: When [name] understands rationale for an exercise or activity, and that rationale is in line with his goals, he participates much better        Exercises      General Comments General comments (skin integrity, edema, etc.): Transferred from mat table surface to sit  on padded footstool; approached facing the stool, balanced self on L knee and end of R LE (at knee) as he pushed body up and hands on top of footstool; pushed torso over stool, and moved enough to eventuallybe able to twist and get R hip on top of stool, then incr time to shift body to center of stool, and come to sitting upright on stool      Pertinent Vitals/Pain Pain Assessment Pain Assessment: Faces Faces Pain Scale: Hurts a little bit Pain Location: Grimace with effort Pain Descriptors / Indicators: Grimacing Pain Intervention(s): Monitored during session, Other (comment) (offered padding for knees while  working on component of floor transfer)    Home Living                          Prior Function            PT Goals (current goals can now be found in the care plan section) Acute Rehab PT Goals Patient Stated Goal: Prepare for prosthetics fitting; Would like to get housing in [place] through the Transition to [employer] PT Goal Formulation: With patient Time For Goal Achievement: 04/01/23 (Goals set on 4/3 continue to be appropriate) Potential to Achieve Goals: Good Progress towards PT goals: Progressing toward goals    Frequency    Min 1X/week      PT Plan Current plan remains appropriate    Co-evaluation              AM-PAC PT "6 Clicks" Mobility   Outcome Measure  Help needed turning from your back to your side while in a flat bed without using bedrails?: None Help needed moving from lying on your back to sitting on the side of a flat bed without using bedrails?: None Help needed moving to and from a bed to a chair (including a wheelchair)?: None Help needed standing up from a chair using your arms (e.g., wheelchair or bedside chair)?: Total Help needed to walk in hospital room?: Total Help needed climbing 3-5 steps with a railing? : Total 6 Click Score: 15    End of Session   Activity Tolerance: Patient tolerated treatment well Patient left: in chair (and heading back to his room) Nurse Communication: Mobility status PT Visit Diagnosis: Muscle weakness (generalized) (M62.81);Other abnormalities of gait and mobility (R26.89)     Time: 1610-9604 PT Time Calculation (min) (ACUTE ONLY): 18 min  Charges:  $Therapeutic Activity: 8-22 mins                     [name], PT  Acute Rehabilitation Services Office [phone]    [name] 03/23/2023, 7:28 PM

## 2023-03-23 NOTE — Plan of Care (Signed)
  Problem: Elimination: Goal: Will not experience complications related to bowel motility Outcome: Progressing Goal: Will not experience complications related to urinary retention Outcome: Progressing   Problem: Pain Managment: Goal: General experience of comfort will improve Outcome: Progressing   

## 2023-03-24 DIAGNOSIS — I96 Gangrene, not elsewhere classified: Secondary | ICD-10-CM | POA: Diagnosis not present

## 2023-03-24 NOTE — Plan of Care (Signed)
  Problem: Coping: Goal: Level of anxiety will decrease Outcome: Progressing   Problem: Elimination: Goal: Will not experience complications related to bowel motility Outcome: Progressing Goal: Will not experience complications related to urinary retention Outcome: Progressing   

## 2023-03-24 NOTE — Progress Notes (Signed)
Dr. Tresa Endo for PROGRESS NOTE [name]  ZOX:[id number] DOB: 1966-08-09 DOA: 12/07/2022 PCP: [name], NP  Brief Narrative/Hospital Course: [name] is a 57 y.o. 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 Hospitalization complicated by difficulty disposition   Subjective: Patient seen and examined He is eating food denies dental pain or gum pain He is on WC. Past 24 hrs afebrile BP stable   Assessment and Plan: Principal Problem:   Gangrene of left foot Active Problems:   Sepsis due to cellulitis   Heart failure with reduced ejection fraction and diastolic dysfunction   Cellulitis of left lower extremity   Open wounds involving multiple regions of lower extremity   Schizoaffective disorder, bipolar type   Tobacco use disorder   Cocaine abuse   Essential hypertension   DM2 (diabetes mellitus, type 2)   Alcohol abuse   History of DVT (deep vein thrombosis)   COPD (chronic obstructive pulmonary disease)   Sepsis 2/2 cellulitis/gangrene of left foot: Initially received broad spectrum antibiotics and subsequently 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  Subsequently stable mobilizing in wheelchair    Chronic combined systolic and diastolic heart failure: Remains euvolemic and compensated.   Cont home Lasix Coreg hydralazine Imdur.  Gum swelling w/ Round soft tissue lesion at anterolateral left aspect of roof 2x1.4x1.7 cm concerned abt periapical abscess:  Dr Mia Creek  consulted> he spoke w/ Dr [name] and I discussed with him as well last week> he was kind enough to see him on 4/19> planning for operative intervention this week- await Oral Surg shift ery  eval. >Continue Augmentin p.o. for now, patient reports the abscess has ":busted open" and draining.He has refused IV Unasyn despite multiple counseling as she does not want iv line.  Essential hypertension: At times borderline controlled but mostly stable, continue Coreg, amlodipine, Imdur.    COPD:Not wheezing continue Dulera twice daily albuterol, also on BID neb per request   Anxiety:Mood is stable, on Atarax as needed.  GERD:Continue Protonix.   Constipation cont MiraLAX , Dulcolax PRN.   Chronic pain on MS Contin 30 mg BID and oxycodone 20 milligram every 4 hourly. Will need direct supervision while taking pills since there was some concerns for hoarding the pills.   Schizoaffective disorder: Mood is stable, seen by psychiatry during hospitalization.  Continue Abilify and Lexapro.     Oral thrush:Continue nystatin swish and swallow.  DVT prophylaxis: enoxaparin (LOVENOX) injection 40 mg Start: 02/25/23 2200 SCD's Start: 12/10/22 1731 Patient has refused to take Lovenox despite multiple counseling> but he moves around on the wheelchair most of the day Code Status:   Code Status: Full Code Family Communication: plan of care discussed with patient at bedside. Patient status is: Inpatient because of unsafe disposition Level of care: Med-Surg   Dispo: The patient is from: home            Anticipated disposition: SNF-difficult disposition unable to obtain a skilled nursing facility due to drug history.  Currently working on housing which has been approved.  Discussed with TOC today  Objective: Vitals last 24 hrs: Vitals:   03/23/23 0759 03/23/23 1230 03/23/23 2238 03/24/23 0826  BP: (!) 161/89 128/82 (!) 144/93 (!) 134/99  Pulse: (!) 58 84 69 71  Resp: 18 18 20  16  Temp: 97.8 F (36.6 C) 98.4 F (36.9 C) (!) 97.5 F (36.4 C) 97.6 F (36.4 C)  TempSrc: Oral Oral Oral Oral  SpO2: 100% 96% 100% 100%  Weight:      Height:       Weight change:   Physical  Examination: General exam: AAox3, weak,older appearing HEENT:Oral mucosa moist, Ear/Nose WNL grossly, dentition normal. Respiratory system: bilaterally  clear BS, no use of accessory muscle Cardiovascular system: S1 & S2 +, regular rate. Gastrointestinal system: Abdomen soft, NT,ND,BS+ Nervous System:Alert, awake, moving extremities and grossly nonfocal Extremities: b/l old bka Skin: No rashes,no icterus. MSK: Normal muscle bulk,tone, power    Medications reviewed:  Scheduled Meds:  amLODipine  10 mg Oral Daily   amoxicillin-clavulanate  1 tablet Oral Q12H   ARIPiprazole  5 mg Oral Daily   vitamin C  1,000 mg Oral Daily   carvedilol  3.125 mg Oral BID WC   chlorhexidine  15 mL Mouth/Throat TID   collagenase   Topical Daily   enoxaparin (LOVENOX) injection  40 mg Subcutaneous Q24H   escitalopram  5 mg Oral Daily   furosemide  40 mg Oral Daily   gabapentin  900 mg Oral TID   isosorbide mononitrate  15 mg Oral Daily   melatonin  3 mg Oral QHS   mometasone-formoterol  2 puff Inhalation BID   morphine  30 mg Oral Q12H   multivitamin with minerals  1 tablet Oral Daily   nystatin  5 mL Oral QID   pantoprazole  40 mg Oral Daily   thiamine  100 mg Oral Daily   Continuous Infusions:  magnesium sulfate bolus IVPB      Diet Order             Diet regular Room service appropriate? Yes; Fluid consistency: Thin  Diet effective now                 No intake or output data in the 24 hours ending 03/24/23 0833 Net IO Since Admission: -43,446.28 mL [03/24/23 0833]  Wt Readings from Last 3 Encounters:  03/21/23 80 kg  12/03/22 83.1 kg  11/03/22 79 kg   Unresulted Labs (From admission, onward)     Start     Ordered   03/04/23 0500  Creatinine, serum  (enoxaparin (LOVENOX)    CrCl >/= 30 ml/min)  Weekly,   R     Comments: while on enoxaparin therapy   Question:  Specimen collection method  Answer:  Lab=Lab collect   02/25/23 1637          Data Reviewed: I have personally  reviewed following labs and imaging studies CBC: Recent Labs  Lab 03/18/23 0943 03/20/23 1519  WBC 6.2 5.9  HGB 14.8 14.8  HCT 43.1 43.6  MCV 87.8 88.3  PLT 207 208    Basic Metabolic Panel: Recent Labs  Lab 03/18/23 0943  NA 139  K 3.8  CL 106  CO2 23  GLUCOSE 163*  BUN 15  CREATININE 0.92  CALCIUM 9.3   Antimicrobials: Anti-infectives (From admission, onward)    Start     Dose/Rate Route Frequency Ordered Stop   03/19/23 0900  amoxicillin-clavulanate (AUGMENTIN) 875-125 MG per tablet 1 tablet        1 tablet Oral Every 12 hours 03/19/23 0809     03/18/23 2000  Ampicillin-Sulbactam (UNASYN) 3 g in sodium chloride 0.9 % 100 mL IVPB  Status:  Discontinued        3 g  200 mL/hr over 30 Minutes Intravenous Every 6 hours 03/18/23 1939 03/19/23 0809   03/18/23 1645  Ampicillin-Sulbactam (UNASYN) 3 g in sodium chloride 0.9 % 100 mL IVPB  Status:  Discontinued        3 g 200 mL/hr over 30 Minutes Intravenous Every 6 hours 03/18/23 1555 03/18/23 1939   12/26/22 2200  doxycycline (VIBRA-TABS) tablet 100 mg        100 mg Oral Every 12 hours 12/26/22 1307 01/08/23 2126   12/24/22 1000  doxycycline (VIBRA-TABS) tablet 100 mg  Status:  Discontinued        100 mg Oral Every 12 hours 12/24/22 0715 12/26/22 1307   12/10/22 1830  ceFAZolin (ANCEF) IVPB 2g/100 mL premix  Status:  Discontinued        2 g 200 mL/hr over 30 Minutes Intravenous Every 8 hours 12/10/22 1731 12/10/22 1740   12/10/22 1330  ceFAZolin (ANCEF) IVPB 2g/100 mL premix        2 g 200 mL/hr over 30 Minutes Intravenous On call to O.R. 12/10/22 0959 12/10/22 1534   12/08/22 0530  vancomycin (VANCOREADY) IVPB 1500 mg/300 mL        1,500 mg 150 mL/hr over 120 Minutes Intravenous Every 12 hours 12/07/22 1741 12/11/22 0955   12/08/22 0345  metroNIDAZOLE (FLAGYL) IVPB 500 mg        500 mg 100 mL/hr over 60 Minutes Intravenous Every 12 hours 12/07/22 1742 12/12/22 0344   12/07/22 2330  ceFEPIme (MAXIPIME) 2 g in sodium  chloride 0.9 % 100 mL IVPB        2 g 200 mL/hr over 30 Minutes Intravenous Every 8 hours 12/07/22 1741 12/11/22 1529   12/07/22 2200  metroNIDAZOLE (FLAGYL) IVPB 500 mg  Status:  Discontinued        500 mg 100 mL/hr over 60 Minutes Intravenous Every 12 hours 12/07/22 2048 12/07/22 2056   12/07/22 1530  ceFEPIme (MAXIPIME) 2 g in sodium chloride 0.9 % 100 mL IVPB        2 g 200 mL/hr over 30 Minutes Intravenous  Once 12/07/22 1516 12/07/22 1734   12/07/22 1530  vancomycin (VANCOREADY) IVPB 1500 mg/300 mL        1,500 mg 150 mL/hr over 120 Minutes Intravenous  Once 12/07/22 1516 12/09/22 0333   12/07/22 1515  metroNIDAZOLE (FLAGYL) IVPB 500 mg        500 mg 100 mL/hr over 60 Minutes Intravenous  Once 12/07/22 1506 12/07/22 1734   12/07/22 1500  ceFAZolin (ANCEF) IVPB 1 g/50 mL premix  Status:  Discontinued        1 g 100 mL/hr over 30 Minutes Intravenous  Once 12/07/22 1452 12/07/22 1513      Culture/Microbiology    Component Value Date/Time   SDES  12/07/2022 1730    BLOOD RIGHT ANTECUBITAL Performed at [facility], [address] [place], [name] [address]    SDES  12/07/2022 1730    BLOOD SITE NOT SPECIFIED Performed at [facility], [address] [place], [name] [address]    SPECREQUEST  12/07/2022 1730    BOTTLES DRAWN AEROBIC AND ANAEROBIC Blood Culture adequate volume Performed at [facility], [address] [place], [name] [address]    SPECREQUEST  12/07/2022 1730    BOTTLES DRAWN AEROBIC ONLY Blood Culture results may not be optimal due to an excessive volume of blood received in culture bottles Performed at [facility], [address] [place], [name] [address]    CULT  12/07/2022 1730    NO GROWTH 5 DAYS Performed at [facility], [address], [place], [name] [address]    CULT  12/07/2022 1730    NO GROWTH 5 DAYS Performed at [facility], [address]  [place], [name] [address]    REPTSTATUS 12/12/2022 FINAL 12/07/2022 1730   REPTSTATUS 12/12/2022 FINAL 12/07/2022 1730   Radiology Studies: No results found.   LOS: 107 days   Lanae Boast, MD [facility]  03/24/2023, 8:33 AM

## 2023-03-25 ENCOUNTER — Inpatient Hospital Stay (HOSPITAL_COMMUNITY): Payer: Medicaid Other | Admitting: Certified Registered Nurse Anesthetist

## 2023-03-25 ENCOUNTER — Encounter (HOSPITAL_COMMUNITY)
Admission: EM | Disposition: A | Discharge status: Home / Self Care | Payer: Self-pay | Source: Home / Self Care | Attending: Internal Medicine

## 2023-03-25 ENCOUNTER — Other Ambulatory Visit: Payer: Self-pay

## 2023-03-25 ENCOUNTER — Encounter (HOSPITAL_COMMUNITY): Payer: Self-pay | Admitting: Internal Medicine

## 2023-03-25 DIAGNOSIS — M272 Inflammatory conditions of jaws: Secondary | ICD-10-CM | POA: Diagnosis not present

## 2023-03-25 DIAGNOSIS — K0889 Other specified disorders of teeth and supporting structures: Secondary | ICD-10-CM

## 2023-03-25 DIAGNOSIS — F17210 Nicotine dependence, cigarettes, uncomplicated: Secondary | ICD-10-CM | POA: Diagnosis not present

## 2023-03-25 DIAGNOSIS — J449 Chronic obstructive pulmonary disease, unspecified: Secondary | ICD-10-CM

## 2023-03-25 DIAGNOSIS — I96 Gangrene, not elsewhere classified: Secondary | ICD-10-CM | POA: Diagnosis not present

## 2023-03-25 HISTORY — PX: MULTIPLE EXTRACTIONS WITH ALVEOLOPLASTY: SHX5342

## 2023-03-25 LAB — CREATININE, SERUM
Creatinine, Ser: 0.92 mg/dL (ref 0.61–1.24)
GFR, Estimated: >60 mL/min (ref >60)

## 2023-03-25 LAB — GLUCOSE, CAPILLARY: Glucose-Capillary: 120 mg/dL — ABNORMAL HIGH (ref 70–99)

## 2023-03-25 LAB — AEROBIC/ANAEROBIC CULTURE W GRAM STAIN (SURGICAL/DEEP WOUND)

## 2023-03-25 SURGERY — MULTIPLE EXTRACTION WITH ALVEOLOPLASTY
Anesthesia: General

## 2023-03-25 MED ORDER — ONDANSETRON HCL 4 MG/2ML IJ SOLN
INTRAMUSCULAR | Status: AC
  Filled 2023-03-25: qty 4

## 2023-03-25 MED ORDER — LIDOCAINE-EPINEPHRINE 1 %-1:100000 IJ SOLN
INTRAMUSCULAR | Status: AC
  Filled 2023-03-25: qty 1

## 2023-03-25 MED ORDER — BUPIVACAINE-EPINEPHRINE 0.25% -1:200000 IJ SOLN
INTRAMUSCULAR | Status: DC | PRN
  Administered 2023-03-25: 10 mL

## 2023-03-25 MED ORDER — ETOMIDATE 2 MG/ML IV SOLN
INTRAVENOUS | Status: AC
  Filled 2023-03-25: qty 10

## 2023-03-25 MED ORDER — LIDOCAINE 2% (20 MG/ML) 5 ML SYRINGE
INTRAMUSCULAR | Status: AC
  Filled 2023-03-25: qty 5

## 2023-03-25 MED ORDER — PROPOFOL 10 MG/ML IV BOLUS
INTRAVENOUS | Status: AC
  Filled 2023-03-25: qty 20

## 2023-03-25 MED ORDER — EPHEDRINE SULFATE-NACL 50-0.9 MG/10ML-% IV SOSY
PREFILLED_SYRINGE | INTRAVENOUS | Status: DC | PRN
  Administered 2023-03-25 (×5): 5 mg via INTRAVENOUS

## 2023-03-25 MED ORDER — MIDAZOLAM HCL 2 MG/2ML IJ SOLN
INTRAMUSCULAR | Status: AC
  Filled 2023-03-25: qty 2

## 2023-03-25 MED ORDER — OXYCODONE HCL 5 MG PO TABS: 5.0000 mg | ORAL_TABLET | Freq: Once | ORAL | Status: DC | PRN

## 2023-03-25 MED ORDER — LACTATED RINGERS IV SOLN: INTRAVENOUS | Status: DC | PRN

## 2023-03-25 MED ORDER — DEXAMETHASONE SODIUM PHOSPHATE 10 MG/ML IJ SOLN
INTRAMUSCULAR | Status: DC | PRN
  Administered 2023-03-25: 10 mg via INTRAVENOUS

## 2023-03-25 MED ORDER — PHENYLEPHRINE 80 MCG/ML (10ML) SYRINGE FOR IV PUSH (FOR BLOOD PRESSURE SUPPORT)
PREFILLED_SYRINGE | INTRAVENOUS | Status: AC
  Filled 2023-03-25: qty 10

## 2023-03-25 MED ORDER — SUGAMMADEX SODIUM 200 MG/2ML IV SOLN
INTRAVENOUS | Status: DC | PRN
  Administered 2023-03-25: 200 mg via INTRAVENOUS

## 2023-03-25 MED ORDER — EPHEDRINE 5 MG/ML INJ
INTRAVENOUS | Status: AC
  Filled 2023-03-25: qty 5

## 2023-03-25 MED ORDER — DEXMEDETOMIDINE HCL IN NACL 80 MCG/20ML IV SOLN
INTRAVENOUS | Status: AC
  Filled 2023-03-25: qty 20

## 2023-03-25 MED ORDER — DEXAMETHASONE SODIUM PHOSPHATE 10 MG/ML IJ SOLN
INTRAMUSCULAR | Status: AC
  Filled 2023-03-25: qty 2

## 2023-03-25 MED ORDER — PROPOFOL 1000 MG/100ML IV EMUL
INTRAVENOUS | Status: AC
  Filled 2023-03-25: qty 100

## 2023-03-25 MED ORDER — ETOMIDATE 2 MG/ML IV SOLN
INTRAVENOUS | Status: DC | PRN
  Administered 2023-03-25: 16 mg via INTRAVENOUS

## 2023-03-25 MED ORDER — OXYMETAZOLINE HCL 0.05 % NA SOLN
NASAL | Status: DC | PRN
  Administered 2023-03-25: 1 via NASAL

## 2023-03-25 MED ORDER — OXYCODONE HCL 5 MG/5ML PO SOLN: 5.0000 mg | Freq: Once | ORAL | Status: DC | PRN

## 2023-03-25 MED ORDER — LIDOCAINE 2% (20 MG/ML) 5 ML SYRINGE
INTRAMUSCULAR | Status: DC | PRN
  Administered 2023-03-25: 80 mg via INTRAVENOUS

## 2023-03-25 MED ORDER — FENTANYL CITRATE (PF) 250 MCG/5ML IJ SOLN
INTRAMUSCULAR | Status: DC | PRN
  Administered 2023-03-25 (×3): 50 ug via INTRAVENOUS

## 2023-03-25 MED ORDER — BACITRACIN ZINC 500 UNIT/GM EX OINT
TOPICAL_OINTMENT | CUTANEOUS | Status: AC
  Filled 2023-03-25: qty 28.35

## 2023-03-25 MED ORDER — BUPIVACAINE-EPINEPHRINE (PF) 0.25% -1:200000 IJ SOLN
INTRAMUSCULAR | Status: AC
  Filled 2023-03-25: qty 30

## 2023-03-25 MED ORDER — LIDOCAINE-EPINEPHRINE 1 %-1:100000 IJ SOLN
INTRAMUSCULAR | Status: DC | PRN
  Administered 2023-03-25: 20 mL

## 2023-03-25 MED ORDER — FENTANYL CITRATE (PF) 250 MCG/5ML IJ SOLN
INTRAMUSCULAR | Status: AC
  Filled 2023-03-25: qty 5

## 2023-03-25 MED ORDER — ROCURONIUM BROMIDE 10 MG/ML (PF) SYRINGE
PREFILLED_SYRINGE | INTRAVENOUS | Status: DC | PRN
  Administered 2023-03-25: 50 mg via INTRAVENOUS

## 2023-03-25 MED ORDER — DEXMEDETOMIDINE HCL IN NACL 80 MCG/20ML IV SOLN
INTRAVENOUS | Status: DC | PRN
  Administered 2023-03-25: 12 ug via INTRAVENOUS

## 2023-03-25 MED ORDER — OXYMETAZOLINE HCL 0.05 % NA SOLN
NASAL | Status: AC
  Filled 2023-03-25: qty 30

## 2023-03-25 MED ORDER — ONDANSETRON HCL 4 MG/2ML IJ SOLN
INTRAMUSCULAR | Status: DC | PRN
  Administered 2023-03-25: 4 mg via INTRAVENOUS

## 2023-03-25 MED ORDER — 0.9 % SODIUM CHLORIDE (POUR BTL) OPTIME
TOPICAL | Status: DC | PRN
  Administered 2023-03-25: 1000 mL

## 2023-03-25 MED ORDER — PHENYLEPHRINE HCL-NACL 20-0.9 MG/250ML-% IV SOLN
INTRAVENOUS | Status: DC | PRN
  Administered 2023-03-25: 25 ug/min via INTRAVENOUS

## 2023-03-25 MED ORDER — MIDAZOLAM HCL 5 MG/5ML IJ SOLN
INTRAMUSCULAR | Status: DC | PRN
  Administered 2023-03-25: 2 mg via INTRAVENOUS

## 2023-03-25 MED ORDER — PHENYLEPHRINE 80 MCG/ML (10ML) SYRINGE FOR IV PUSH (FOR BLOOD PRESSURE SUPPORT)
PREFILLED_SYRINGE | INTRAVENOUS | Status: DC | PRN
  Administered 2023-03-25 (×3): 80 ug via INTRAVENOUS

## 2023-03-25 MED ORDER — AMISULPRIDE (ANTIEMETIC) 5 MG/2ML IV SOLN: 10.0000 mg | Freq: Once | INTRAVENOUS | Status: DC | PRN

## 2023-03-25 MED ORDER — HYDROMORPHONE HCL 1 MG/ML IJ SOLN: 0.2500 mg | INTRAMUSCULAR | Status: DC | PRN

## 2023-03-25 SURGICAL SUPPLY — 20 items
BLADE SURG 15 STRL LF DISP TIS (BLADE) IMPLANT
BLADE SURG 15 STRL SS (BLADE) ×2
CANISTER SUCT 3000ML PPV (MISCELLANEOUS) ×1 IMPLANT
COVER SURGICAL LIGHT HANDLE (MISCELLANEOUS) ×1 IMPLANT
GAUZE SPONGE 4X4 12PLY STRL (GAUZE/BANDAGES/DRESSINGS) IMPLANT
GLOVE ORTHO TXT STRL SZ7.5 (GLOVE) ×1 IMPLANT
GOWN STRL REUS W/ TWL LRG LVL3 (GOWN DISPOSABLE) ×2 IMPLANT
GOWN STRL REUS W/ TWL XL LVL3 (GOWN DISPOSABLE) ×1 IMPLANT
GOWN STRL REUS W/TWL LRG LVL3 (GOWN DISPOSABLE) ×1
GOWN STRL REUS W/TWL XL LVL3 (GOWN DISPOSABLE) ×1
KIT BASIN OR (CUSTOM PROCEDURE TRAY) ×1 IMPLANT
KIT TURNOVER KIT B (KITS) ×1 IMPLANT
NS IRRIG 1000ML POUR BTL (IV SOLUTION) ×1 IMPLANT
PAD ARMBOARD 7.5X6 YLW CONV (MISCELLANEOUS) ×2 IMPLANT
STAPLER VISISTAT 35W (STAPLE) ×1 IMPLANT
SUCTION FRAZIER HANDLE 10FR (MISCELLANEOUS) ×1
SUCTION TUBE FRAZIER 10FR DISP (MISCELLANEOUS) IMPLANT
SUT CHROMIC 3 0 SH 27 (SUTURE) IMPLANT
TRAY ENT MC OR (CUSTOM PROCEDURE TRAY) ×1 IMPLANT
WATER STERILE IRR 1000ML POUR (IV SOLUTION) ×1 IMPLANT

## 2023-03-25 NOTE — Progress Notes (Signed)
Physical Therapy Treatment Patient Details Name: [name] MRN: [id number] DOB: Dec 21, 1965 Today's Date: 03/25/2023   History of Present Illness Pt is a 57 y/o M who presented 12/17/22 with L ankle cellulitis and underwent L BKA on 1/10. Oral abscesses noted, underwent oral surgery Wed, 4/24; PMH includes asthma, bioplar 1, depression, schizoaffective disorder, CVA, chronic CHF, homelessness, HTN, DM2, cocaine and cannabis use, and R AKA.    PT Comments    Continuing work on functional mobility and activity tolerance;  session focused on therapeutic exercises for UEs and trunk/core in prep for prostheses, and for strengtheing for floor transfer performance; focused on lats and triceps, and pt performed tricep dips on mat table, with hands on stepstool placed behind him on mat table; Performed quite well;   [name]'s current wheelchair has significant wear and tear, and if it can be covered, I heartily agree with getting [name] a new manual wheelchair with L amputee pad   Recommendations for follow up therapy are one component of a multi-disciplinary discharge planning process, led by the attending physician.  Recommendations may be updated based on patient status, additional functional criteria and insurance authorization.  Follow Up Recommendations  Can patient physically be transported by private vehicle:  (If a car transfer is meaningful for pt, can work on)    Assistance Recommended at Discharge PRN  Patient can return home with the following Assist for transportation   Equipment Recommendations  Wheelchair (measurements PT);Other (comment) (Current wc has significant wear and tear)    Recommendations for Other Services Other (comment) (Recommend starting the Mental/Behavioral Health support through [name] to start while [name] is here in the hospital)     Precautions / Restrictions Precautions Precautions: Fall (Fall risk is present, but minimal) Precaution Comments: R knee  disrticulation, L BKA Restrictions Weight Bearing Restrictions: No     Mobility  Bed Mobility                 Patient Response: Cooperative  Transfers Overall transfer level: Modified independent                 General transfer comment: Independently transfers wc > mat table > wc    Ambulation/Gait                   Set designer Details (indicate cue type and reason): Spends lots of time in the wc throughout the day; cruising hallways  Modified Rankin (Stroke Patients Only)       Balance                                            Cognition Arousal/Alertness: Awake/alert Behavior During Therapy: WFL for tasks assessed/performed Overall Cognitive Status: Within Functional Limits for tasks assessed (for mobiltiy tasks we worked on)                                 General Comments: When [name] understands rationale for an exercise or activity, and that rationale is in line with his goals, he participates much better        Exercises Other Exercises Other Exercises: tricep dips, with hands on step stool behind pt, and knees padded with yellow eggcrate cushioning; 2 sets  of 10 reps    General Comments General comments (skin integrity, edema, etc.): Scoots on his bottom well, pressing up with UEs, to traverse mat table during exercises      Pertinent Vitals/Pain Pain Assessment Pain Assessment: Faces Faces Pain Scale: Hurts a little bit Pain Location: Grimace with effort Pain Descriptors / Indicators: Grimacing Pain Intervention(s): Monitored during session, Other (comment) (padded knees)    Home Living                          Prior Function            PT Goals (current goals can now be found in the care plan section) Acute Rehab PT Goals Patient Stated Goal: Prepare for prosthetics fitting; Would like to get housing in  [facility] through the Transition to [employer] PT Goal Formulation: With patient Time For Goal Achievement: 04/01/23 Potential to Achieve Goals: Good Progress towards PT goals: Progressing toward goals    Frequency    Min 1X/week      PT Plan Current plan remains appropriate    Co-evaluation              AM-PAC PT "6 Clicks" Mobility   Outcome Measure  Help needed turning from your back to your side while in a flat bed without using bedrails?: None Help needed moving from lying on your back to sitting on the side of a flat bed without using bedrails?: None Help needed moving to and from a bed to a chair (including a wheelchair)?: None Help needed standing up from a chair using your arms (e.g., wheelchair or bedside chair)?: Total Help needed to walk in hospital room?: Total Help needed climbing 3-5 steps with a railing? : Total 6 Click Score: 15    End of Session   Activity Tolerance: Patient tolerated treatment well Patient left: Other (comment) (in wheelchair, rolling independently around the unit) Nurse Communication: Mobility status PT Visit Diagnosis: Muscle weakness (generalized) (M62.81);Other abnormalities of gait and mobility (R26.89)     Time: 1610-9604 PT Time Calculation (min) (ACUTE ONLY): 22 min  Charges:  $Therapeutic Exercise: 8-22 mins                     [name], PT  Acute Rehabilitation Services Office [phone]    [name] 03/25/2023, 7:25 PM

## 2023-03-25 NOTE — TOC Progression Note (Signed)
Transition of Care [facility]) - Progression Note    Patient Details  Name: [name] MRN: [id number] Date of Birth: August 21, 1966  Transition of [facility] CM/SW Contact  [name], [name] Phone Number: 03/25/2023, 1:19 PM  Clinical Narrative:    CSW spoke with patient at bedside. Patient requesting CSW provide [name] with contact information for Ardyth Gal, [facility] owner to discuss the possibility of obtaining housing at one of the available locations. Patient requesting a new wheelchair as his is currently having issues with the one he has already.  CSW spoke with [facility] staff via email to provide them with contact information.  CSW spoke with Barbara Cower at [employer] to have wheelchair and amputee pad ordered.  Expected Discharge Plan: Skilled Nursing Facility Barriers to Discharge: Continued Medical Work up, SNF Pending bed offer  Expected Discharge Plan and Services   Discharge Planning Services: CM Consult Post Acute Care Choice: Skilled Nursing Facility Living arrangements for the past 2 months: Homeless                                       Social Determinants of Health (SDOH) Interventions SDOH Screenings   Food Insecurity: Food Insecurity Present (11/24/2022)  Housing: Medium Risk (12/07/2022)  Transportation Needs: Unmet Transportation Needs (12/07/2022)  Utilities: Not At Risk (12/07/2022)  Alcohol Screen: Medium Risk (06/24/2019)  Depression (PHQ2-9): Medium Risk (02/10/2021)  Tobacco Use: High Risk (03/25/2023)    Readmission Risk Interventions    12/09/2022    9:02 AM 12/01/2022    3:56 PM 08/18/2022    9:00 AM  Readmission Risk Prevention Plan  Transportation Screening Complete Complete Complete  Medication Review Oceanographer) Complete Complete Complete  PCP or Specialist appointment within 3-5 days of discharge Complete Complete Complete  HRI or Home Care Consult Complete Complete Complete  SW Recovery Care/Counseling Consult Complete Complete Complete   Palliative Care Screening Not Applicable Not Applicable Not Applicable  Skilled Nursing Facility Not Applicable Not Applicable Not Applicable  SNF Comments  n/a

## 2023-03-25 NOTE — Progress Notes (Signed)
Physical Therapy Note  Patient suffers from Bil LE amputations which impairs their ability to perform daily activities like walking in the home.  A walker alone will not resolve the issues with performing activities of daily living. A wheelchair will allow patient to safely perform daily activities.  The patient can self propel.  Van Clines, [place]  Acute Rehabilitation Services Office [phone]

## 2023-03-25 NOTE — Anesthesia Preprocedure Evaluation (Addendum)
Anesthesia Evaluation  Patient identified by MRN, date of birth, ID band  Reviewed: Allergy & Precautions, NPO status , Patient's Chart, lab work & pertinent test results  Airway Mallampati: II  TM Distance: >3 FB Neck ROM: Full    Dental no notable dental hx. (+) Missing, Dental Advisory Given   Pulmonary asthma , COPD, Current Smoker and Patient abstained from smoking.   Pulmonary exam normal breath sounds clear to auscultation       Cardiovascular hypertension, + CAD and +CHF  Normal cardiovascular exam Rhythm:Regular Rate:Normal     Neuro/Psych Seizures -,  PSYCHIATRIC DISORDERS  Depression Bipolar Disorder Schizophrenia   Neuromuscular disease CVA    GI/Hepatic ,GERD  ,,(+) Hepatitis -  Endo/Other  diabetes    Renal/GU negative Renal ROS     Musculoskeletal negative musculoskeletal ROS (+)    Abdominal   Peds  Hematology   Anesthesia Other Findings   Reproductive/Obstetrics                             Anesthesia Physical Anesthesia Plan  ASA: 3  Anesthesia Plan: General   Post-op Pain Management:    Induction: Intravenous  PONV Risk Score and Plan: 1 and Ondansetron, Midazolam and Treatment may vary due to age or medical condition  Airway Management Planned: Nasal ETT  Additional Equipment: ClearSight  Intra-op Plan:   Post-operative Plan: Extubation in OR  Informed Consent: I have reviewed the patients History and Physical, chart, labs and discussed the procedure including the risks, benefits and alternatives for the proposed anesthesia with the patient or authorized representative who has indicated his/her understanding and acceptance.       Plan Discussed with: CRNA  Anesthesia Plan Comments:        Anesthesia Quick Evaluation

## 2023-03-25 NOTE — Op Note (Signed)
03/25/2023  2:45 PM  PATIENT:  [name]  57 y.o. male  PRE-OPERATIVE DIAGNOSIS:  ANTERIOR MAXILARY ABSCESS/NONRESTORABLE DENTITION  POST-OPERATIVE DIAGNOSIS:  ANTERIOR MAXILARY ABSCESS/NONRESTORABLE DENTITION  PROCEDURE:   Incision and drainage anterior maxillary abscess Surgical extraction of teeth 4,5,6,11,12,13,20,21,22,27,28,29. Simple extraction teeth #7,8,9,10,23,24,25,26 Alveoloplasty x 4 quadrants  SURGEON:  Surgeon(s) and Role:    * Sol Englert, DMD - Primary  ANESTHESIA:   general  EBL:  30cc  COMPLICATIONS: none  Cultures: Anterior maxillary abscess  Operative findings: carious dentition; retained roots; purulence from anterior maxillary abscess  Indication for Procedure: 57 y/o M with poor, nonrestorable dentition, anterior maxillary abscess, presents for I&D max abscess, extraction of all remaining teeth with alveoloplasty.  Procedure: The patient was identified in preoperative holding by both anesthesia and the maxillofacial team.  Health history was reviewed.  Consent was verified.  The patient brought back to the operating room and placed the table in the supine position.  Standard ASA leads and monitors were placed.  The patient was preoxygenated, induced, and his airway was protected with a nasoendotracheal tube.  The tube was taped and secured by the anesthesia care team.  The patient was then prepped and draped for standard maxilla facial procedure.  A throat pack was placed.  The patient was injected with 1% lidocaine with 1 100,000 epinephrine in the bilateral maxilla and mandible.  A 15 blade was used to make a stab incision in the left anterior palate with noted purulence upon incision.  A hemostat was used to explore the anterior palatal abscess to break up loculations. A 15 blade was then used to make sulcular/crestal incisions in the anterior and left maxilla.  A full-thickness mucoperiosteal flap was elevated.  Buccal bone was removed with a  rongeur forcep.  The teeth #9,10,11,12,13 were luxated and extracted with a combination of root picks and dental forceps.  The sites were thoroughly curetted.  Alveoplasty was completed with a rongeur forcep, and smoothed with a bone file.  The wounds were then thoroughly irrigated.  The tissues were then reapproximated and secured with 3-0 chromic gut suture placed in a continuous interlocking fashion.   A 15 blade was then used to make sulcular/crestal incisions in the anterior and right maxilla.  A full-thickness mucoperiosteal flap was elevated.  Buccal bone was removed with a rongeur forcep.  The teeth #4,5,6,7,8 were luxated and extracted with a combination of root picks and dental forceps.  The sites were thoroughly curetted.  Alveoplasty was completed with a rongeur forcep, and smoothed with a bone file.  The wounds were then thoroughly irrigated.  The tissues were then reapproximated and secured with 3-0 chromic gut suture placed in a continuous interlocking fashion.  A 15 blade was then used to make sulcular/crestal incisions in the anterior and left mandible.  A full-thickness mucoperiosteal flap was elevated.  Buccal bone was removed with a rongeur forcep.  The teeth #20,21,22,23,24 were luxated and extracted with a combination of root picks and dental forceps.  The sites were thoroughly curetted.  Alveoplasty was completed with a rongeur forcep, and smoothed with a bone file.  The wounds were then thoroughly irrigated.  The tissues were then reapproximated and secured with 3-0 chromic gut suture placed in a continuous interlocking fashion.  A 15 blade was then used to make sulcular/crestal incisions in the anterior and right mandible.  A full-thickness mucoperiosteal flap was elevated.  Buccal bone was removed with a rongeur forcep.  The teeth #25,26,27,28,29 were luxated  and extracted with a combination of root picks and dental forceps.  The sites were thoroughly curetted.  Alveoplasty was completed  with a rongeur forcep, and smoothed with a bone file.  The wounds were then thoroughly irrigated.  The tissues were then reapproximated and secured with 3-0 chromic gut suture placed in a continuous interlocking fashion.   This point the entire oral cavity was thoroughly irrigated.  Throat pack was removed. The patient was then returned to the anesthesia care team where he was extubated without event.  He was transported to the postanesthesia care unit for recovery will be admitted back to the floor once meeting appropriate criteria.  Junita Push, DMD Oral and maxillofacial surgery

## 2023-03-25 NOTE — Anesthesia Postprocedure Evaluation (Signed)
Anesthesia Post Note  Patient: [name]  Procedure(s) Performed: IRRIGATION AND DEBRIDEMENT, MULTIPLE EXTRACTION WITH ALVEOLOPLASTY X 4 , EXTRACTIONS 4 THROUGH 13, 20 THROUGHT 29     Patient location during evaluation: PACU Anesthesia Type: General Level of consciousness: awake and alert Pain management: pain level controlled Vital Signs Assessment: post-procedure vital signs reviewed and stable Respiratory status: spontaneous breathing, nonlabored ventilation and respiratory function stable Cardiovascular status: blood pressure returned to baseline and stable Postop Assessment: no apparent nausea or vomiting Anesthetic complications: no   No notable events documented.  Last Vitals:  Vitals:   03/25/23 1545 03/25/23 1600  BP: (!) 144/79 (!) 138/90  Pulse: 80 77  Resp: 20 18  Temp:  36.9 C  SpO2: 94% 94%    Last Pain:  Vitals:   03/25/23 1600  TempSrc:   PainSc: 0-No pain                 Lowella Curb

## 2023-03-25 NOTE — Anesthesia Procedure Notes (Signed)
Procedure Name: Intubation Date/Time: 03/25/2023 1:19 PM  Performed by: Waynard Edwards, CRNAPre-anesthesia Checklist: Patient identified, Emergency Drugs available, Suction available and Patient being monitored Patient Re-evaluated:Patient Re-evaluated prior to induction Oxygen Delivery Method: Circle System Utilized Preoxygenation: Pre-oxygenation with 100% oxygen Induction Type: IV induction Ventilation: Mask ventilation without difficulty Laryngoscope Size: Miller and 2 Grade View: Grade I Nasal Tubes: Nasal Rae, Nasal prep performed, Magill forceps- large, utilized and Right Tube size: 7.0 mm Number of attempts: 1 Placement Confirmation: ETT inserted through vocal cords under direct vision, positive ETCO2 and breath sounds checked- equal and bilateral Secured at: 26 cm Tube secured with: Tape Dental Injury: Teeth and Oropharynx as per pre-operative assessment

## 2023-03-25 NOTE — Interval H&P Note (Signed)
History and Physical Interval Note:  03/25/2023 12:56 PM  [name]  has presented today for surgery, with the diagnosis of ANTERIOR MAXILARY ABSCESS/NONRESTORABLE DENTITION.  The various methods of treatment have been discussed with the patient and family. After consideration of risks, benefits and other options for treatment, the patient has consented to  Procedure(s): IRRIGATION AND DEBRIDEMENT, MULTIPLE EXTRACTION WITH ALVEOLOPLASTY X 4 , EXTRACTIONS 4 THROUGH 13, 20 THROUGHT 29 (N/A) as a surgical intervention.  The patient's history has been reviewed, patient examined, no change in status, stable for surgery.  I have reviewed the patient's chart and labs.  Questions were answered to the patient's satisfaction.    Anesthesia request: nasal intubation  Vivia Ewing, DMD Oral & Maxillofacial Surgery

## 2023-03-25 NOTE — H&P (Signed)
Anesthesia H&P Update: History and Physical Exam reviewed; patient is OK for planned anesthetic and procedure. ? ?

## 2023-03-25 NOTE — Transfer of Care (Signed)
Immediate Anesthesia Transfer of Care Note  Patient: [name]  Procedure(s) Performed: IRRIGATION AND DEBRIDEMENT, MULTIPLE EXTRACTION WITH ALVEOLOPLASTY X 4 , EXTRACTIONS 4 THROUGH 13, 20 THROUGHT 29  Patient Location: PACU  Anesthesia Type:General  Level of Consciousness: awake and alert   Airway & Oxygen Therapy: Patient Spontanous Breathing and Patient connected to face mask oxygen  Post-op Assessment: Report given to RN and Post -op Vital signs reviewed and stable  Post vital signs: Reviewed and stable  Last Vitals:  Vitals Value Taken Time  BP 159/93 03/25/23 15:20  Temp    Pulse 85 03/25/23 15:20  Resp 18 03/25/23 15:20  SpO2 98 03/25/23 15:20    Last Pain:  Vitals:   03/25/23 1232  TempSrc:   PainSc: 0-No pain      Patients Stated Pain Goal: 0 (03/24/23 1251)  Complications: No notable events documented.

## 2023-03-25 NOTE — Plan of Care (Signed)
  Problem: Pain Managment: Goal: General experience of comfort will improve Outcome: Progressing   Problem: Safety: Goal: Ability to remain free from injury will improve Outcome: Progressing   

## 2023-03-25 NOTE — Plan of Care (Signed)
  Problem: Coping: Goal: Level of anxiety will decrease Outcome: Progressing   Problem: Elimination: Goal: Will not experience complications related to bowel motility Outcome: Progressing Goal: Will not experience complications related to urinary retention Outcome: Progressing   

## 2023-03-25 NOTE — Progress Notes (Signed)
Dr. Tresa Endo for PROGRESS NOTE Kitt Ledet  ZOX:[id number] DOB: Jan 24, 1966 DOA: 12/07/2022 PCP: [name], NP  Brief Narrative/Hospital Course: [name] is a 57 y.o. 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 Hospitalization complicated by difficulty disposition   Subjective: Seen and examined this morning ambulating in the wheelchair in the hallway  He has no complaints  He is n.p.o. 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  Subsequently stable mobilizing in wheelchair    Chronic combined systolic and diastolic heart failure: Remains euvolemic and compensated.   Cont home  Lasix Coreg hydralazine Imdur.  Essential hypertension: Well-controlled, continue Coreg, amlodipine, Imdur.    COPD:Not wheezing continue Dulera bid albuterol, also on BID neb per request   Anxiety:Mood is stable, on Atarax as needed.  GERD:Continue Protonix.   Constipation cont MiraLAX , Dulcolax PRN.   Chronic pain on MS Contin 30 mg BID and oxycodone 20 milligram every 4 hourly. Will need direct supervision while taking pills since there was some concerns for hoarding the pills.   Schizoaffective disorder: Mood is stable, seen by psychiatry during hospitalization.  Continue Abilify and Lexapro.     Oral thrush received  nystatin swish and swallow.  DVT prophylaxis: enoxaparin (LOVENOX) injection 40 mg Start: 02/25/23 2200 SCD's Start: 12/10/22 1731 Patient has refused to take Lovenox despite multiple counseling> but he moves around on the wheelchair most of the day Code Status:   Code Status: Full Code Family Communication: plan of care discussed with patient at bedside. Patient status is: Inpatient because of unsafe disposition Level of care: Med-Surg   Dispo: The patient is from: home            Anticipated disposition: SNF-difficult disposition unable to obtain a skilled nursing facility due to drug history.  Currently working on housing which has been approved.   Objective: Vitals last 24 hrs: Vitals:   03/23/23 2238 03/24/23 0826 03/24/23 1428 03/24/23 1932  BP: (!) 144/93 (!) 134/99 (!) 140/88 (!) 156/86  Pulse: 69 71 80 71  Resp: 20 16 17 18   Temp: (!) 97.5 F (36.4 C) 97.6 F (36.4 C) 98 F (36.7 C) (!) 97.5 F (36.4 C)  TempSrc: Oral Oral Oral Oral  SpO2: 100% 100% 100% 99%  Weight:  Height:       Weight change:   Physical Examination: General exam: AAox3, weak,older appearing HEENT:Oral mucosa moist, Ear/Nose WNL grossly, dentition normal.  Refused to open his mouth today Respiratory system: bilaterally clear BS, no use of accessory  muscle Cardiovascular system: S1 & S2 +, regular rate,. Gastrointestinal system: Abdomen soft, NT,ND,BS+ Nervous System:Alert, awake, moving extremities and grossly nonfocal Extremities: B/L BKA Skin: No rashes,no icterus. MSK: Normal muscle bulk,tone, power   Medications reviewed:  Scheduled Meds:  amLODipine  10 mg Oral Daily   amoxicillin-clavulanate  1 tablet Oral Q12H   ARIPiprazole  5 mg Oral Daily   vitamin C  1,000 mg Oral Daily   carvedilol  3.125 mg Oral BID WC   chlorhexidine  15 mL Mouth/Throat TID   collagenase   Topical Daily   enoxaparin (LOVENOX) injection  40 mg Subcutaneous Q24H   escitalopram  5 mg Oral Daily   furosemide  40 mg Oral Daily   gabapentin  900 mg Oral TID   isosorbide mononitrate  15 mg Oral Daily   melatonin  3 mg Oral QHS   mometasone-formoterol  2 puff Inhalation BID   morphine  30 mg Oral Q12H   multivitamin with minerals  1 tablet Oral Daily   nystatin  5 mL Oral QID   pantoprazole  40 mg Oral Daily   thiamine  100 mg Oral Daily   Continuous Infusions:  magnesium sulfate bolus IVPB      Diet Order             Diet NPO time specified  Diet effective now                 No intake or output data in the 24 hours ending 03/25/23 1104 Net IO Since Admission: -43,446.28 mL [03/25/23 1104]  Wt Readings from Last 3 Encounters:  03/21/23 80 kg  12/03/22 83.1 kg  11/03/22 79 kg   Unresulted Labs (From admission, onward)     Start     Ordered   03/04/23 0500  Creatinine, serum  (enoxaparin (LOVENOX)    CrCl >/= 30 ml/min)  Weekly,   R     Comments: while on enoxaparin therapy   Question:  Specimen collection method  Answer:  Lab=Lab collect   02/25/23 1637          Data Reviewed: I have personally reviewed following labs and imaging studies CBC: Recent Labs  Lab 03/20/23 1519  WBC 5.9  HGB 14.8  HCT 43.6  MCV 88.3  PLT 208    Basic Metabolic Panel: Recent Labs  Lab 03/25/23 0611  CREATININE 0.92    Antimicrobials: Anti-infectives (From admission, onward)    Start     Dose/Rate Route Frequency Ordered Stop   03/19/23 0900  amoxicillin-clavulanate (AUGMENTIN) 875-125 MG per tablet 1 tablet        1 tablet Oral Every 12 hours 03/19/23 0809     03/18/23 2000  Ampicillin-Sulbactam (UNASYN) 3 g in sodium chloride 0.9 % 100 mL IVPB  Status:  Discontinued        3 g 200 mL/hr over 30 Minutes Intravenous Every 6 hours 03/18/23 1939 03/19/23 0809   03/18/23 1645  Ampicillin-Sulbactam (UNASYN) 3 g in sodium chloride 0.9 % 100 mL IVPB  Status:  Discontinued        3 g 200 mL/hr over 30 Minutes Intravenous Every 6 hours 03/18/23 1555 03/18/23 1939   12/26/22 2200  doxycycline (VIBRA-TABS) tablet 100 mg  100 mg Oral Every 12 hours 12/26/22 1307 01/08/23 2126   12/24/22 1000  doxycycline (VIBRA-TABS) tablet 100 mg  Status:  Discontinued        100 mg Oral Every 12 hours 12/24/22 0715 12/26/22 1307   12/10/22 1830  ceFAZolin (ANCEF) IVPB 2g/100 mL premix  Status:  Discontinued        2 g 200 mL/hr over 30 Minutes Intravenous Every 8 hours 12/10/22 1731 12/10/22 1740   12/10/22 1330  ceFAZolin (ANCEF) IVPB 2g/100 mL premix        2 g 200 mL/hr over 30 Minutes Intravenous On call to O.R. 12/10/22 0959 12/10/22 1534   12/08/22 0530  vancomycin (VANCOREADY) IVPB 1500 mg/300 mL        1,500 mg 150 mL/hr over 120 Minutes Intravenous Every 12 hours 12/07/22 1741 12/11/22 0955   12/08/22 0345  metroNIDAZOLE (FLAGYL) IVPB 500 mg        500 mg 100 mL/hr over 60 Minutes Intravenous Every 12 hours 12/07/22 1742 12/12/22 0344   12/07/22 2330  ceFEPIme (MAXIPIME) 2 g in sodium chloride 0.9 % 100 mL IVPB        2 g 200 mL/hr over 30 Minutes Intravenous Every 8 hours 12/07/22 1741 12/11/22 1529   12/07/22 2200  metroNIDAZOLE (FLAGYL) IVPB 500 mg  Status:  Discontinued        500 mg 100 mL/hr over 60 Minutes Intravenous Every 12 hours 12/07/22 2048 12/07/22 2056   12/07/22 1530  ceFEPIme (MAXIPIME)  2 g in sodium chloride 0.9 % 100 mL IVPB        2 g 200 mL/hr over 30 Minutes Intravenous  Once 12/07/22 1516 12/07/22 1734   12/07/22 1530  vancomycin (VANCOREADY) IVPB 1500 mg/300 mL        1,500 mg 150 mL/hr over 120 Minutes Intravenous  Once 12/07/22 1516 12/09/22 0333   12/07/22 1515  metroNIDAZOLE (FLAGYL) IVPB 500 mg        500 mg 100 mL/hr over 60 Minutes Intravenous  Once 12/07/22 1506 12/07/22 1734   12/07/22 1500  ceFAZolin (ANCEF) IVPB 1 g/50 mL premix  Status:  Discontinued        1 g 100 mL/hr over 30 Minutes Intravenous  Once 12/07/22 1452 12/07/22 1513      Culture/Microbiology    Component Value Date/Time   SDES  12/07/2022 1730    BLOOD RIGHT ANTECUBITAL Performed at [facility], [address] [place], [name] [address]    SDES  12/07/2022 1730    BLOOD SITE NOT SPECIFIED Performed at [facility], [address] [place], [name] [address]    SPECREQUEST  12/07/2022 1730    BOTTLES DRAWN AEROBIC AND ANAEROBIC Blood Culture adequate volume Performed at [facility], [address], [place], [name] [address]    SPECREQUEST  12/07/2022 1730    BOTTLES DRAWN AEROBIC ONLY Blood Culture results may not be optimal due to an excessive volume of blood received in culture bottles Performed at [facility], [address], [place], [name] [address]    CULT  12/07/2022 1730    NO GROWTH 5 DAYS Performed at [facility], [address], [place], [name] [address]    CULT  12/07/2022 1730    NO GROWTH 5 DAYS Performed at [facility], [address], [place], [name] [address]    REPTSTATUS 12/12/2022 FINAL 12/07/2022 1730   REPTSTATUS 12/12/2022 FINAL 12/07/2022  1730   Radiology Studies: No results found.   LOS: 108 days   Lanae Boast, MD [facility]  03/25/2023, 11:04 AM

## 2023-03-25 NOTE — Brief Op Note (Addendum)
03/25/2023  2:45 PM  PATIENT:  [name]  57 y.o. male  PRE-OPERATIVE DIAGNOSIS:  ANTERIOR MAXILARY ABSCESS/NONRESTORABLE DENTITION  POST-OPERATIVE DIAGNOSIS:  ANTERIOR MAXILARY ABSCESS/NONRESTORABLE DENTITION  PROCEDURE:   Incision and drainage anterior maxillary abscess Surgical extraction of teeth 4,5,6,11,12,13,20,21,22,27,28,29. Simple extraction teeth #7,8,9,10,23,24,25,26 Alveoloplasty x 4 quadrants  SURGEON:  Surgeon(s) and Role:    * Ilea Hilton, DMD - Primary  ANESTHESIA:   general  EBL:  30cc  BLOOD ADMINISTERED:none  DRAINS: none   LOCAL MEDICATIONS USED:  LIDOCAINE   SPECIMEN:  anaerobic/aerobic cultures obtained from anterior maxillary abscess  DISPOSITION OF SPECIMEN:   Cultures  COUNTS:  YES  TOURNIQUET:  * No tourniquets in log *  DICTATION: .Dragon Dictation  PLAN OF CARE: Admit to inpatient   PATIENT DISPOSITION:  PACU - hemodynamically stable.   Delay start of Pharmacological VTE agent (>24hrs) due to surgical blood loss or risk of bleeding: no  *POSTOP RECOMMENDATIONS: Augmentin  po x 7 days. Chlorhexidine rinses TID x 10 days Gauze pressure for hemostasis Keep head elevated and ice as needed for swelling. Sutures will dissolve. Pureed diet to start, then advance as tolerated.

## 2023-03-26 ENCOUNTER — Encounter (HOSPITAL_COMMUNITY): Payer: Self-pay | Admitting: Oral Surgery

## 2023-03-26 DIAGNOSIS — I96 Gangrene, not elsewhere classified: Secondary | ICD-10-CM | POA: Diagnosis not present

## 2023-03-26 LAB — AEROBIC/ANAEROBIC CULTURE W GRAM STAIN (SURGICAL/DEEP WOUND)

## 2023-03-26 NOTE — Progress Notes (Signed)
Dr. Tresa Endo for PROGRESS NOTE Tad Fancher  XBM:[id number] DOB: [birth year] DOA: 12/07/2022 PCP: [name], NP  Brief Narrative/Hospital Course: [name] is a 57 y.o. 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 Hospitalization complicated by difficulty disposition. He was found to have maxillary abscess and periapical abscess, patient underwent oral surgery with IN DF anterior maxillary abscess, surgical extraction of teeth 4,5,6,11,12,13,20,21,22,27,28,29. Simple extraction teeth #7,8,9,10,23,24,25,26, Alveoloplasty x 4 quadrants on 03/25/23   Subjective: Seen and examined this morning ambulating in the wheelchair in the hallway  He has no complaints  He is n.p.o. 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 Simple extraction teeth #7,8,9,10,23,24,25,26, Alveoloplasty x 4 quadrants on 03/25/23 Dr [name].  Continue Augmentin he had refused Unasyn.  He is refusing  soft diet requesting regular diet.  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  Subsequently stable mobilizing in wheelchair    Chronic combined systolic and diastolic heart failure: Remains euvolemic and compensated.   Cont home Lasix Coreg hydralazine Imdur.  Essential hypertension: Well-controlled, continue Coreg, amlodipine, Imdur.    COPD:Not wheezing continue Dulera bid albuterol, also on BID neb per request   Anxiety:Mood is stable, on Atarax as needed.  GERD:Continue Protonix.   Constipation cont MiraLAX , Dulcolax PRN.   Chronic pain on MS Contin 30 mg BID and oxycodone 20 milligram every 4 hourly. Will need direct supervision while taking pills since there was some concerns for hoarding the pills.   Schizoaffective disorder: Mood is stable, seen by psychiatry during hospitalization.  Continue Abilify and Lexapro.     Oral thrush received  nystatin swish and swallow.  DVT prophylaxis: enoxaparin (LOVENOX) injection 40 mg Start: 02/25/23 2200 SCD's Start: 12/10/22 1731 Patient has refused to take Lovenox despite multiple counseling> but he moves around on the wheelchair most of the day Code Status:   Code Status: Full Code Family Communication: plan of care discussed with patient at bedside. Patient status is: Inpatient because of unsafe disposition Level of care: Med-Surg   Dispo: The patient is from: home            Anticipated disposition: SNF-difficult disposition unable to obtain a skilled nursing facility due to drug history.  Currently working on housing which has been approved.   Objective: Vitals last 24 hrs: Vitals:   03/25/23 1600 03/25/23 1929 03/25/23 2103 03/26/23 0957  BP: (!) 138/90 134/86  (!) 158/100  Pulse: 77 78 81 81  Resp: 18 17 18 18   Temp: 98.4 F (36.9 C) 98 F (  36.7 C)  98 F (36.7 C)  TempSrc:      SpO2: 94% 92% 94% 100%   Weight:      Height:       Weight change:   Physical Examination: General exam: AAox3, weak,older appearing HEENT:Oral mucosa moist, Ear/Nose WNL grossly, dentition normal. Respiratory system: bilaterally clear BS, no use of accessory muscle Cardiovascular system: S1 & S2 +, regular rate. Gastrointestinal system: Abdomen soft, NT,ND,BS+ Nervous System:Alert, awake, moving extremities and grossly nonfocal Extremities: b/l BKA,lower extremities warm Skin:No rashes,no icterus. ZOX:WRUEAV muscle bulk,tone, power   His gums have stitches  Medications reviewed:  Scheduled Meds:  amLODipine  10 mg Oral Daily   amoxicillin-clavulanate  1 tablet Oral Q12H   ARIPiprazole  5 mg Oral Daily   vitamin C  1,000 mg Oral Daily   carvedilol  3.125 mg Oral BID WC   chlorhexidine  15 mL Mouth/Throat TID   collagenase   Topical Daily   enoxaparin (LOVENOX) injection  40 mg Subcutaneous Q24H   escitalopram  5 mg Oral Daily   furosemide  40 mg Oral Daily   gabapentin  900 mg Oral TID   isosorbide mononitrate  15 mg Oral Daily   melatonin  3 mg Oral QHS   mometasone-formoterol  2 puff Inhalation BID   morphine  30 mg Oral Q12H   multivitamin with minerals  1 tablet Oral Daily   nystatin  5 mL Oral QID   pantoprazole  40 mg Oral Daily   thiamine  100 mg Oral Daily   Continuous Infusions:  magnesium sulfate bolus IVPB      Diet Order             DIET - DYS 1 Room service appropriate? Yes; Fluid consistency: Thin  Diet effective now                  Intake/Output Summary (Last 24 hours) at 03/26/2023 1149 Last data filed at 03/25/2023 1451 Gross per 24 hour  Intake 500 ml  Output 30 ml  Net 470 ml  Net IO Since Admission: -42,976.28 mL [03/26/23 1149]  Wt Readings from Last 3 Encounters:  03/21/23 80 kg  12/03/22 83.1 kg  11/03/22 79 kg   Unresulted Labs (From admission, onward)     Start     Ordered   03/04/23 0500  Creatinine, serum  (enoxaparin (LOVENOX)    CrCl >/= 30  ml/min)  Weekly,   R     Comments: while on enoxaparin therapy   Question:  Specimen collection method  Answer:  Lab=Lab collect   02/25/23 1637          Data Reviewed: I have personally reviewed following labs and imaging studies CBC: Recent Labs  Lab 03/20/23 1519  WBC 5.9  HGB 14.8  HCT 43.6  MCV 88.3  PLT 208   Basic Metabolic Panel: Recent Labs  Lab 03/25/23 0611  CREATININE 0.92  Antimicrobials: Anti-infectives (From admission, onward)    Start     Dose/Rate Route Frequency Ordered Stop   03/19/23 0900  amoxicillin-clavulanate (AUGMENTIN) 875-125 MG per tablet 1 tablet        1 tablet Oral Every 12 hours 03/19/23 0809     03/18/23 2000  Ampicillin-Sulbactam (UNASYN) 3 g in sodium chloride 0.9 % 100 mL IVPB  Status:  Discontinued        3 g 200 mL/hr over 30 Minutes Intravenous Every 6 hours 03/18/23 1939 03/19/23 0809   03/18/23 1645  Ampicillin-Sulbactam (UNASYN) 3 g in sodium chloride 0.9 % 100 mL IVPB  Status:  Discontinued        3 g 200 mL/hr over 30 Minutes Intravenous Every 6 hours 03/18/23 1555 03/18/23 1939   12/26/22 2200  doxycycline (VIBRA-TABS) tablet 100 mg        100 mg Oral Every 12 hours 12/26/22 1307 01/08/23 2126   12/24/22 1000  doxycycline (VIBRA-TABS) tablet 100 mg  Status:  Discontinued        100 mg Oral Every 12 hours 12/24/22 0715 12/26/22 1307   12/10/22 1830  ceFAZolin (ANCEF) IVPB 2g/100 mL premix  Status:  Discontinued        2 g 200 mL/hr over 30 Minutes Intravenous Every 8 hours 12/10/22 1731 12/10/22 1740   12/10/22 1330  ceFAZolin (ANCEF) IVPB 2g/100 mL premix        2 g 200 mL/hr over 30 Minutes Intravenous On call to O.R. 12/10/22 0959 12/10/22 1534   12/08/22 0530  vancomycin (VANCOREADY) IVPB 1500 mg/300 mL        1,500 mg 150 mL/hr over 120 Minutes Intravenous Every 12 hours 12/07/22 1741 12/11/22 0955   12/08/22 0345  metroNIDAZOLE (FLAGYL) IVPB 500 mg        500 mg 100 mL/hr over 60 Minutes Intravenous Every 12 hours  12/07/22 1742 12/12/22 0344   12/07/22 2330  ceFEPIme (MAXIPIME) 2 g in sodium chloride 0.9 % 100 mL IVPB        2 g 200 mL/hr over 30 Minutes Intravenous Every 8 hours 12/07/22 1741 12/11/22 1529   12/07/22 2200  metroNIDAZOLE (FLAGYL) IVPB 500 mg  Status:  Discontinued        500 mg 100 mL/hr over 60 Minutes Intravenous Every 12 hours 12/07/22 2048 12/07/22 2056   12/07/22 1530  ceFEPIme (MAXIPIME) 2 g in sodium chloride 0.9 % 100 mL IVPB        2 g 200 mL/hr over 30 Minutes Intravenous  Once 12/07/22 1516 12/07/22 1734   12/07/22 1530  vancomycin (VANCOREADY) IVPB 1500 mg/300 mL        1,500 mg 150 mL/hr over 120 Minutes Intravenous  Once 12/07/22 1516 12/09/22 0333   12/07/22 1515  metroNIDAZOLE (FLAGYL) IVPB 500 mg        500 mg 100 mL/hr over 60 Minutes Intravenous  Once 12/07/22 1506 12/07/22 1734   12/07/22 1500  ceFAZolin (ANCEF) IVPB 1 g/50 mL premix  Status:  Discontinued        1 g 100 mL/hr over 30 Minutes Intravenous  Once 12/07/22 1452 12/07/22 1513      Culture/Microbiology    Component Value Date/Time   SDES ABSCESS 03/25/2023 1333   SPECREQUEST MAXILLARY ABSCESS PT ON AUGMENTIN 03/25/2023 1333   CULT  03/25/2023 1333    TOO YOUNG TO READ Performed at [facility], [address] [place], [name] [address]    REPTSTATUS PENDING 03/25/2023 1333   Radiology Studies: No results found.   LOS: 109 days   Lanae Boast, MD [facility]  03/26/2023, 11:49 AM

## 2023-03-26 NOTE — Plan of Care (Signed)
  Problem: Coping: Goal: Level of anxiety will decrease Outcome: Adequate for Discharge   Problem: Elimination: Goal: Will not experience complications related to bowel motility Outcome: Adequate for Discharge Goal: Will not experience complications related to urinary retention Outcome: Adequate for Discharge   

## 2023-03-26 NOTE — Progress Notes (Signed)
Subjective: POD1 s/p I&D anterior maxillary abscess; extraction of all remaining teeth; avleoloplasty. No complaints, states that he is hungry and wants to eat.  Objective: Vital signs in last 24 hours: Temp:  [97.6 F (36.4 C)-98.4 F (36.9 C)] 98 F (36.7 C) (04/25 0957) Pulse Rate:  [77-82] 81 (04/25 0957) Resp:  [17-22] 18 (04/25 0957) BP: (134-159)/(79-100) 158/100 (04/25 0957) SpO2:  [92 %-100 %] 100 % (04/25 0957) Wt Readings from Last 1 Encounters:  03/21/23 80 kg    Intake/Output from previous day: 04/24 0701 - 04/25 0700 In: 500 [I.V.:500] Out: 30 [Blood:30] Intake/Output this shift: No intake/output data recorded.  PE: Gen: a&o, in wheelchair, nad HEENT: minimal facial edema; wounds intact/hemostatic. No FOM edema. Oropharynx clear.  No results for input(s): "WBC", "HGB", "HCT", "PLT" in the last 72 hours.  Recent Labs    03/25/23 0611  CREATININE 0.92    Medications: I have reviewed the patient's current medications.  Assessment/Plan: 57 y/o M POD1 s/p s/p I&D anterior maxillary abscess; extraction of all remaining teeth; avleoloplasty. Progressing well.   *POSTOP RECOMMENDATIONS: Augmentin  po x 7 days. Chlorhexidine rinses TID x 10 days Gauze pressure for hemostasis Keep head elevated and ice as needed for swelling. Sutures will dissolve. Soft Pureed diet to start 2-3 days, then advance as tolerated.   *call Dr. [name] at [phone] with questions/concerns   Vivia Ewing, DMD Oral & Maxillofacial Surgery

## 2023-03-27 DIAGNOSIS — I96 Gangrene, not elsewhere classified: Secondary | ICD-10-CM | POA: Diagnosis not present

## 2023-03-27 LAB — AEROBIC/ANAEROBIC CULTURE W GRAM STAIN (SURGICAL/DEEP WOUND)

## 2023-03-27 NOTE — Progress Notes (Signed)
Mobility Specialist Progress Note:   03/27/23 1630  Mobility  Activity  (self-propelled w/c)  Level of Assistance Modified independent, requires aide device or extra time  Assistive Device Wheelchair  Distance Ambulated (ft) 500 ft  Activity Response Tolerated well  Mobility Referral Yes  $Mobility charge 1 Mobility   Pt eager to go outside with mobility specialist. No physical assistance required to self-propel  w/c ~523ft. Pt back in room with all needs met.   [name] Mobility Specialist Please contact via SecureChat or  Rehab office at [phone]

## 2023-03-27 NOTE — Plan of Care (Signed)
  Problem: Pain Managment: Goal: General experience of comfort will improve Outcome: Progressing   Problem: Skin Integrity: Goal: Risk for impaired skin integrity will decrease Outcome: Progressing   

## 2023-03-27 NOTE — Progress Notes (Signed)
Dr. Tresa Endo for PROGRESS NOTE [name]  ZOX:[id number] DOB: [birth year] DOA: 12/07/2022 PCP: [name], NP  Brief Narrative/Hospital Course: [name] is a 57 y.o. 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 Hospitalization complicated by difficulty disposition. He was found to have maxillary abscess and periapical abscess, patient underwent oral surgery with IN DF anterior maxillary abscess, surgical extraction of teeth 4,5,6,11,12,13,20,21,22,27,28,29. 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 Simple extraction teeth #7,8,9,10,23,24,25,26, Alveoloplasty x 4  quadrants on 03/25/23 Dr [name].  Appreciate input continue Augmentin x 7 days, chlorhexidine rinse x 10 days 3 times daily, soft diet- he refused to be on pureed.   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  Subsequently stable mobilizing in wheelchair    Chronic combined systolic and diastolic heart failure: Remains euvolemic and compensated.   Cont home Lasix Coreg hydralazine Imdur.  Essential hypertension: Well-controlled, continue Coreg, amlodipine, Imdur.    COPD:Not wheezing continue Dulera bid albuterol, also on BID neb per request   Anxiety:Mood is stable, on Atarax as needed.  GERD:Continue Protonix.   Constipation cont MiraLAX , Dulcolax PRN.   Chronic pain on MS Contin 30 mg BID and oxycodone 20 milligram every 4 hourly. Will need direct supervision while taking pills since there was some concerns for hoarding the pills.   Schizoaffective disorder: Mood is stable, seen by psychiatry during hospitalization.  Continue Abilify and Lexapro.     Oral thrush received  nystatin swish and swallow.  DVT prophylaxis: enoxaparin (LOVENOX) injection 40 mg Start: 02/25/23 2200 SCD's Start: 12/10/22 1731 Patient has refused to take Lovenox despite multiple counseling> but he moves around on the wheelchair most of the day Code Status:   Code Status: Full Code Family Communication: plan of care discussed with patient at bedside. Patient status is: Inpatient because of unsafe disposition Level of care: Med-Surg   Dispo: The patient is from: home            Anticipated disposition: SNF-difficult disposition unable to obtain a skilled nursing facility due to drug history.  Currently working on housing which has been approved.   Objective: Vitals last 24 hrs: Vitals:   03/26/23 1650 03/26/23 2001 03/26/23 2055 03/27/23 0730  BP: (!) 144/88 (!) 146/82  Marland Kitchen)  165/85  Pulse: 96 63 77  62  Resp:  20 16 18   Temp:  97.7 F (36.5 C)  98 F (36.7 C)  TempSrc:  Oral    SpO2:  93% 98% 100%  Weight:      Height:       Weight change:   Physical Examination: General exam: AA, weak,older appearing HEENT:Oral mucosa moist, Ear/Nose WNL grossly, dentition normal. Respiratory system: bilaterally clear BS, no use of accessory muscle Cardiovascular system: S1 & S2 +, regular rate. Gastrointestinal system: Abdomen soft, NT,ND,BS+ Nervous System:Alert, awake, moving extremities and grossly nonfocal Extremities: b/l BKA, lower extremities warm Skin: No rashes,no icterus. MSK: Normal muscle bulk,tone, power  His gums have stitches  Medications reviewed:  Scheduled Meds:  amLODipine  10 mg Oral Daily   amoxicillin-clavulanate  1 tablet Oral Q12H   ARIPiprazole  5 mg Oral Daily   vitamin C  1,000 mg Oral Daily   carvedilol  3.125 mg Oral BID WC   chlorhexidine  15 mL Mouth/Throat TID   collagenase   Topical Daily   enoxaparin (LOVENOX) injection  40 mg Subcutaneous Q24H   escitalopram  5 mg Oral Daily   furosemide  40 mg Oral Daily   gabapentin  900 mg Oral TID   isosorbide mononitrate  15 mg Oral Daily   melatonin  3 mg Oral QHS   mometasone-formoterol  2 puff Inhalation BID   morphine  30 mg Oral Q12H   multivitamin with minerals  1 tablet Oral Daily   nystatin  5 mL Oral QID   pantoprazole  40 mg Oral Daily   thiamine  100 mg Oral Daily   Continuous Infusions:  magnesium sulfate bolus IVPB      Diet Order             DIET DYS 3 Room service appropriate? Yes; Fluid consistency: Thin  Diet effective now                 No intake or output data in the 24 hours ending 03/27/23 0926 Net IO Since Admission: -42,976.28 mL [03/27/23 0926]  Wt Readings from Last 3 Encounters:  03/21/23 80 kg  12/03/22 83.1 kg  11/03/22 79 kg   Unresulted Labs (From admission, onward)     Start     Ordered   03/04/23 0500  Creatinine, serum  (enoxaparin (LOVENOX)    CrCl  >/= 30 ml/min)  Weekly,   R     Comments: while on enoxaparin therapy   Question:  Specimen collection method  Answer:  Lab=Lab collect   02/25/23 1637          Data Reviewed: I have personally reviewed following labs and imaging studies CBC: Recent Labs  Lab 03/20/23 1519  WBC 5.9  HGB 14.8  HCT 43.6  MCV 88.3  PLT 208    Basic Metabolic Panel: Recent Labs  Lab 03/25/23 0611  CREATININE 0.92   Antimicrobials: Anti-infectives (From admission, onward)    Start     Dose/Rate Route Frequency Ordered Stop   03/19/23 0900  amoxicillin-clavulanate (AUGMENTIN) 875-125 MG per tablet 1 tablet        1 tablet Oral Every 12 hours 03/19/23 0809     03/18/23 2000  Ampicillin-Sulbactam (UNASYN) 3 g in sodium chloride 0.9 % 100 mL IVPB  Status:  Discontinued        3 g 200 mL/hr over 30 Minutes Intravenous Every 6 hours 03/18/23 1939 03/19/23 0809   03/18/23  1645  Ampicillin-Sulbactam (UNASYN) 3 g in sodium chloride 0.9 % 100 mL IVPB  Status:  Discontinued        3 g 200 mL/hr over 30 Minutes Intravenous Every 6 hours 03/18/23 1555 03/18/23 1939   12/26/22 2200  doxycycline (VIBRA-TABS) tablet 100 mg        100 mg Oral Every 12 hours 12/26/22 1307 01/08/23 2126   12/24/22 1000  doxycycline (VIBRA-TABS) tablet 100 mg  Status:  Discontinued        100 mg Oral Every 12 hours 12/24/22 0715 12/26/22 1307   12/10/22 1830  ceFAZolin (ANCEF) IVPB 2g/100 mL premix  Status:  Discontinued        2 g 200 mL/hr over 30 Minutes Intravenous Every 8 hours 12/10/22 1731 12/10/22 1740   12/10/22 1330  ceFAZolin (ANCEF) IVPB 2g/100 mL premix        2 g 200 mL/hr over 30 Minutes Intravenous On call to O.R. 12/10/22 0959 12/10/22 1534   12/08/22 0530  vancomycin (VANCOREADY) IVPB 1500 mg/300 mL        1,500 mg 150 mL/hr over 120 Minutes Intravenous Every 12 hours 12/07/22 1741 12/11/22 0955   12/08/22 0345  metroNIDAZOLE (FLAGYL) IVPB 500 mg        500 mg 100 mL/hr over 60 Minutes Intravenous  Every 12 hours 12/07/22 1742 12/12/22 0344   12/07/22 2330  ceFEPIme (MAXIPIME) 2 g in sodium chloride 0.9 % 100 mL IVPB        2 g 200 mL/hr over 30 Minutes Intravenous Every 8 hours 12/07/22 1741 12/11/22 1529   12/07/22 2200  metroNIDAZOLE (FLAGYL) IVPB 500 mg  Status:  Discontinued        500 mg 100 mL/hr over 60 Minutes Intravenous Every 12 hours 12/07/22 2048 12/07/22 2056   12/07/22 1530  ceFEPIme (MAXIPIME) 2 g in sodium chloride 0.9 % 100 mL IVPB        2 g 200 mL/hr over 30 Minutes Intravenous  Once 12/07/22 1516 12/07/22 1734   12/07/22 1530  vancomycin (VANCOREADY) IVPB 1500 mg/300 mL        1,500 mg 150 mL/hr over 120 Minutes Intravenous  Once 12/07/22 1516 12/09/22 0333   12/07/22 1515  metroNIDAZOLE (FLAGYL) IVPB 500 mg        500 mg 100 mL/hr over 60 Minutes Intravenous  Once 12/07/22 1506 12/07/22 1734   12/07/22 1500  ceFAZolin (ANCEF) IVPB 1 g/50 mL premix  Status:  Discontinued        1 g 100 mL/hr over 30 Minutes Intravenous  Once 12/07/22 1452 12/07/22 1513      Culture/Microbiology    Component Value Date/Time   SDES ABSCESS 03/25/2023 1333   SPECREQUEST MAXILLARY ABSCESS PT ON AUGMENTIN 03/25/2023 1333   CULT  03/25/2023 1333    TOO YOUNG TO READ Performed at [facility], [address], [place], [name] [address]    REPTSTATUS PENDING 03/25/2023 1333   Radiology Studies: No results found.   LOS: 110 days   Lanae Boast, MD [facility]  03/27/2023, 9:26 AM

## 2023-03-28 DIAGNOSIS — I96 Gangrene, not elsewhere classified: Secondary | ICD-10-CM | POA: Diagnosis not present

## 2023-03-28 DIAGNOSIS — K122 Cellulitis and abscess of mouth: Secondary | ICD-10-CM

## 2023-03-28 NOTE — Plan of Care (Signed)
?  Problem: Safety: ?Goal: Ability to remain free from injury will improve ?Outcome: Progressing ?  ?Problem: Education: ?Goal: Knowledge of General Education information will improve ?Description: Including pain rating scale, medication(s)/side effects and non-pharmacologic comfort measures ?Outcome: Progressing ?  ?Problem: Nutrition: ?Goal: Adequate nutrition will be maintained ?Outcome: Progressing ?  ?

## 2023-03-28 NOTE — Progress Notes (Signed)
Progress Note    [name]   [id number]  DOB: [birth year]  DOA: 12/07/2022     111 PCP: [name], NP  Initial CC: left leg wound  Hospital Course: [name] is a 57 y.o. 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 Hospitalization complicated by difficulty disposition. He was found to have maxillary abscess and periapical abscess, patient underwent oral surgery with IN DF anterior maxillary abscess, surgical extraction of teeth 4,5,6,11,12,13,20,21,22,27,28,29. Simple extraction teeth #7,8,9,10,23,24,25,26, Alveoloplasty x 4 quadrants on 03/25/23  Interval History:  No events overnight.  Tolerating soft diet and hoping to advance soon. Some expected mouth soreness but seems to be doing okay.   Assessment and Plan:   Anterior maxillary abscess, nonrestorable dentition Gum swelling and pain Patient underwent oral surgery with IN DF anterior maxillary abscess, surgical extraction of teeth 4,5,6,11,12,13,20,21,22,27,28,29. Simple extraction teeth #7,8,9,10,23,24,25,26, Alveoloplasty x 4 quadrants on 03/25/23 Dr [name].  Appreciate input continue Augmentin x 7 days, chlorhexidine rinse x 10 days 3 times daily, soft diet- he refused to be on pureed.   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 -Patient counseled on dietary restrictions and recommendations -Continue oral Lasix, Coreg, hydralazine, Imdur  Constipation cont MiraLAX , Dulcolax PRN.   Chronic pain on MS Contin 30 mg BID and oxycodone 20 milligram every 4 hourly. Will need direct supervision while taking pills since there was some concerns for hoarding the pills.   Schizoaffective disorder: Mood is stable, seen by psychiatry during hospitalization.  Continue Abilify and Lexapro.     Oral thrush received nystatin swish and swallow.    COPD Continued with nebulizers, bronchodilators as needed    Type 2 diabetes mellitus Continue with sliding scale insulin at this time    History of DVT Patient is on Eliquis for anticoagulation.    Hypertension -Continue current regimen    History of alcohol/cocaine/tobacco abuse currently no withdrawal signs at this time. Cessation done at bedside   Old records reviewed in assessment of this patient  Antimicrobials:   DVT prophylaxis:  enoxaparin (LOVENOX) injection 40 mg Start: 02/25/23 2200 SCD's Start: 12/10/22 1731   Code Status:   Code Status: Full Code  Mobility Assessment (last 72 hours)     Mobility Assessment     Row Name 03/28/23 0845 03/27/23 2145 03/27/23 0925 03/26/23 2023 03/26/23 0900   Does patient have an order for bedrest or is patient medically unstable No - Continue assessment No - Continue assessment No - Continue assessment No - Continue assessment No - Continue assessment   What is the highest level of mobility based on the progressive mobility assessment? Level 2 (Chairfast) - Balance while sitting on edge of bed and cannot stand Level 2 (Chairfast) - Balance while sitting on edge of bed and cannot stand Level 2 (Chairfast) - Balance while sitting on edge of bed and cannot stand Level 2 (Chairfast) - Balance  while sitting on edge of bed and cannot stand Level 2 (Chairfast) -  Balance while sitting on edge of bed and cannot stand   Is the above level different from baseline mobility prior to current illness? Yes - Recommend PT order Yes - Recommend PT order Yes - Recommend PT order Yes - Recommend PT order Yes - Recommend PT order    Row Name 03/25/23 2015 03/25/23 1900         Does patient have an order for bedrest or is patient medically unstable No - Continue assessment --      What is the highest level of mobility based on the progressive mobility assessment? Level 2 (Chairfast) - Balance while sitting on edge of bed and cannot stand Level 2 (Chairfast) - Balance while sitting on edge of bed and cannot stand      Is the above level different from baseline mobility prior to current illness? Yes - Recommend PT order --               Barriers to discharge: homeless Disposition Plan:  TBD Status is: Inpatient  Objective: Blood pressure (!) 139/100, pulse 69, temperature 98.1 F (36.7 C), temperature source Oral, resp. rate 18, height 5\' 9"  (1.753 m), weight 90.8 kg, SpO2 93 %.  Examination:  Physical Exam Constitutional:      Appearance: Normal appearance.  HENT:     Head: Normocephalic and atraumatic.     Mouth/Throat:     Mouth: Mucous membranes are moist.  Eyes:     Extraocular Movements: Extraocular movements intact.  Cardiovascular:     Rate and Rhythm: Normal rate and regular rhythm.  Pulmonary:     Effort: Pulmonary effort is normal.     Breath sounds: Normal breath sounds.  Abdominal:     General: Bowel sounds are normal. There is no distension.     Palpations: Abdomen is soft.     Tenderness: There is no abdominal tenderness.  Musculoskeletal:     Cervical back: Normal range of motion and neck supple.     Comments: Left BKA noted with well healed stump. Right AKA still appreciated  Skin:    General: Skin is warm and dry.  Neurological:     General: No focal deficit present.     Mental Status: He is alert.  Psychiatric:         Mood and Affect: Mood normal.      Consultants:  Orthopedic surgery  Procedures:  12/10/2022: Left BKA  Data Reviewed: No results found for this or any previous visit (from the past 24 hour(s)).   I have reviewed pertinent nursing notes, vitals, labs, and images as necessary. I have ordered labwork to follow up on as indicated.  I have reviewed the last notes from staff over past 24 hours. I have discussed patient's care plan and test results with nursing staff, CM/SW, and other staff as appropriate.    LOS: 111 days   Lewie Chamber, MD [facility] 03/28/2023, 2:05 PM

## 2023-03-29 DIAGNOSIS — I96 Gangrene, not elsewhere classified: Secondary | ICD-10-CM | POA: Diagnosis not present

## 2023-03-29 DIAGNOSIS — L0390 Cellulitis, unspecified: Secondary | ICD-10-CM | POA: Diagnosis not present

## 2023-03-29 DIAGNOSIS — A419 Sepsis, unspecified organism: Secondary | ICD-10-CM | POA: Diagnosis not present

## 2023-03-29 DIAGNOSIS — L03116 Cellulitis of left lower limb: Secondary | ICD-10-CM | POA: Diagnosis not present

## 2023-03-29 NOTE — Progress Notes (Signed)
Mobility Specialist Progress Note:   03/29/23 1600  Mobility  Activity  (self-propelled w/c outside)  Level of Assistance Modified independent, requires aide device or extra time  Assistive Device Wheelchair  Distance Ambulated (ft) 500 ft  Activity Response Tolerated well  Mobility Referral Yes  $Mobility charge 1 Mobility   Pt able to self-propel w/c outside. Back in room with all needs met.  [name] Mobility Specialist Please contact via SecureChat or  Rehab office at [phone]

## 2023-03-29 NOTE — Progress Notes (Signed)
Progress Note    [name]   WUJ:[id number]  DOB: 09/14/1966  DOA: 12/07/2022     112 PCP: [name], NP  Initial CC: left leg wound  Hospital Course: Normal Recinos is a 57 y.o. 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 Hospitalization complicated by difficulty disposition. He was found to have maxillary abscess and periapical abscess, patient underwent oral surgery with IN DF anterior maxillary abscess, surgical extraction of teeth 4,5,6,11,12,13,20,21,22,27,28,29. Simple extraction teeth #7,8,9,10,23,24,25,26, Alveoloplasty x 4 quadrants on 03/25/23  Interval History:  No events overnight.  Rolling around unit in wheelchair.  Tolerating diet well and wishing to advance further today.  Assessment and Plan:  Anterior maxillary abscess, nonrestorable dentition Gum swelling and pain Patient underwent oral surgery with IN DF anterior maxillary abscess, surgical extraction of teeth 4,5,6,11,12,13,20,21,22,27,28,29. Simple extraction teeth #7,8,9,10,23,24,25,26, Alveoloplasty x 4 quadrants on 03/25/23 Dr [name].  Appreciate input continue Augmentin x 7 days, chlorhexidine rinse x 10 days 3 times daily, soft diet- he refused to be on pureed.  -Trial of advancing diet today.  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 -Patient counseled on dietary restrictions and recommendations -Continue oral Lasix, Coreg, hydralazine, Imdur  Constipation cont MiraLAX , Dulcolax PRN.   Chronic pain on MS Contin 30 mg BID and oxycodone 20 milligram every 4 hourly. Will need direct supervision while taking pills since there was some concerns for hoarding the pills.   Schizoaffective disorder: Mood is stable, seen by psychiatry during hospitalization.  Continue Abilify and Lexapro.     Oral thrush received nystatin swish and swallow.    COPD Continued with nebulizers, bronchodilators as needed    Type 2 diabetes mellitus Continue with sliding scale insulin at this time    History of DVT Patient is on Eliquis for anticoagulation.    Hypertension -Continue current regimen    History of alcohol/cocaine/tobacco abuse currently no withdrawal signs at this time. Cessation done at bedside   Old records reviewed in assessment of this patient  Antimicrobials:   DVT prophylaxis:  enoxaparin (LOVENOX) injection 40 mg Start: 02/25/23 2200 SCD's Start: 12/10/22 1731   Code Status:   Code Status: Full Code  Mobility Assessment (last 72 hours)     Mobility Assessment     Row Name 03/29/23 0822 03/28/23 2000 03/28/23 0845 03/27/23 2145 03/27/23 0925   Does patient have an order for bedrest or is patient medically unstable No - Continue assessment No - Continue assessment No - Continue assessment No - Continue assessment No - Continue assessment   What is the highest level of mobility based on the progressive mobility assessment? Level 2 (Chairfast) - Balance while sitting on edge of bed and cannot stand Level 2 (Chairfast) - Balance while sitting on edge of bed and cannot stand Level 2 (Chairfast) - Balance while sitting on edge of bed and cannot stand Level 2 (  Chairfast) - Balance while sitting on edge of bed and cannot stand Level 2 (Chairfast) -  Balance while sitting on edge of bed and cannot stand   Is the above level different from baseline mobility prior to current illness? Yes - Recommend PT order Yes - Recommend PT order Yes - Recommend PT order Yes - Recommend PT order Yes - Recommend PT order    Row Name 03/26/23 2023           Does patient have an order for bedrest or is patient medically unstable No - Continue assessment       What is the highest level of mobility based on the progressive mobility assessment? Level 2 (Chairfast) - Balance while sitting on edge of bed and cannot stand       Is the above level different from baseline mobility prior to current illness? Yes - Recommend PT order                Barriers to discharge: homeless Disposition Plan:  TBD Status is: Inpatient  Objective: Blood pressure (!) 155/94, pulse 82, temperature 98.6 F (37 C), temperature source Oral, resp. rate 18, height 5\' 9"  (1.753 m), weight 90.6 kg, SpO2 92 %.  Examination:  Physical Exam Constitutional:      Appearance: Normal appearance.  HENT:     Head: Normocephalic and atraumatic.     Mouth/Throat:     Mouth: Mucous membranes are moist.     Comments: Multiple extracted teeth appreciated Eyes:     Extraocular Movements: Extraocular movements intact.  Cardiovascular:     Rate and Rhythm: Normal rate and regular rhythm.  Pulmonary:     Effort: Pulmonary effort is normal.     Breath sounds: Normal breath sounds.  Abdominal:     General: Bowel sounds are normal. There is no distension.     Palpations: Abdomen is soft.     Tenderness: There is no abdominal tenderness.  Musculoskeletal:     Cervical back: Normal range of motion and neck supple.     Comments: Left BKA noted with well healed stump. Right AKA still appreciated  Skin:    General: Skin is warm and dry.  Neurological:     General: No focal deficit present.     Mental Status: He is alert.  Psychiatric:        Mood and Affect: Mood normal.       Consultants:  Orthopedic surgery  Procedures:  12/10/2022: Left BKA  Data Reviewed: No results found for this or any previous visit (from the past 24 hour(s)).   I have reviewed pertinent nursing notes, vitals, labs, and images as necessary. I have ordered labwork to follow up on as indicated.  I have reviewed the last notes from staff over past 24 hours. I have discussed patient's care plan and test results with nursing staff, CM/SW, and other staff as appropriate.    LOS: 112 days   Lewie Chamber, MD [facility] 03/29/2023, 2:00 PM

## 2023-03-30 DIAGNOSIS — K047 Periapical abscess without sinus: Secondary | ICD-10-CM

## 2023-03-30 DIAGNOSIS — I96 Gangrene, not elsewhere classified: Secondary | ICD-10-CM | POA: Diagnosis not present

## 2023-03-30 NOTE — Plan of Care (Signed)
  Problem: Coping: Goal: Level of anxiety will decrease Outcome: Progressing   Problem: Elimination: Goal: Will not experience complications related to urinary retention Outcome: Progressing   

## 2023-03-30 NOTE — Progress Notes (Signed)
Progress Note    [name]   UJW:[id number]  DOB: 13-Oct-1966  DOA: 12/07/2022     113 PCP: [name], NP  Initial CC: left leg wound  Hospital Course: [name] is a 57 y.o. 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 Hospitalization complicated by difficulty disposition. He was found to have maxillary abscess and periapical abscess, patient underwent oral surgery with IN DF anterior maxillary abscess, surgical extraction of teeth 4,5,6,11,12,13,20,21,22,27,28,29. Simple extraction teeth #7,8,9,10,23,24,25,26, Alveoloplasty x 4 quadrants on 03/25/23  Interval History:  No events overnight.  Rolling around unit in wheelchair.  Tolerated solid foods with diet advancement yesterday.   Assessment and Plan:  Anterior maxillary abscess, nonrestorable dentition Gum swelling and pain Patient underwent oral surgery with IN DF anterior maxillary abscess, surgical extraction of teeth 4,5,6,11,12,13,20,21,22,27,28,29. Simple extraction teeth #7,8,9,10,23,24,25,26, Alveoloplasty x 4 quadrants on 03/25/23 Dr [name].  Appreciate input continue Augmentin x 7 days, chlorhexidine rinse x 10 days 3 times daily, soft diet- he refused to be on pureed.  -Trial of advancing diet today.  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 -Patient counseled on dietary restrictions and recommendations -Continue oral Lasix, Coreg, hydralazine, Imdur  Left BKA R AKA - discussed with Dr. Lajoyce Corners regarding his WC bound status and disposition issues on 03/30/23 - fitting for prostheses not ideal as chance of being ambulatory is minimal and he is likely expected to be permanently WC bound   Constipation cont MiraLAX , Dulcolax PRN.   Chronic pain on MS Contin 30 mg BID and oxycodone 20 milligram every 4 hourly. Will need direct supervision while taking pills since there was some concerns for hoarding the pills.   Schizoaffective disorder: Mood is stable, seen by psychiatry during hospitalization.  Continue Abilify and Lexapro.     Oral thrush received nystatin swish and swallow.    COPD Continued with nebulizers, bronchodilators as needed    Type 2 diabetes mellitus Continue with sliding scale insulin at this time    History of DVT Patient is on Eliquis for anticoagulation.    Hypertension -Continue current regimen    History of alcohol/cocaine/tobacco abuse currently no withdrawal signs at this time. Cessation done at bedside   Old records reviewed in assessment of this patient  Antimicrobials:   DVT prophylaxis:  enoxaparin (LOVENOX) injection 40 mg Start: 02/25/23 2200 SCD's Start: 12/10/22 1731   Code Status:   Code Status: Full Code  Mobility Assessment (last 72 hours)     Mobility Assessment     Row Name 03/30/23 0830 03/29/23 2145 03/29/23 0822 03/28/23 2000 03/28/23 0845   Does patient have an order for bedrest or is patient medically unstable No - Continue assessment No - Continue assessment No - Continue assessment No - Continue assessment No - Continue assessment   What is the highest level of mobility based on the progressive mobility assessment? Level  2 (Chairfast) - Balance while sitting on edge of bed and cannot stand Level 2  (Chairfast) - Balance while sitting on edge of bed and cannot stand Level 2 (Chairfast) - Balance while sitting on edge of bed and cannot stand Level 2 (Chairfast) - Balance while sitting on edge of bed and cannot stand Level 2 (Chairfast) - Balance while sitting on edge of bed and cannot stand   Is the above level different from baseline mobility prior to current illness? Yes - Recommend PT order Yes - Recommend PT order Yes - Recommend PT order Yes - Recommend PT order Yes - Recommend PT order    Row Name 03/27/23 2145           Does patient have an order for bedrest or is patient medically unstable No - Continue assessment       What is the highest level of mobility based on the progressive mobility assessment? Level 2 (Chairfast) - Balance while sitting on edge of bed and cannot stand       Is the above level different from baseline mobility prior to current illness? Yes - Recommend PT order                Barriers to discharge: homeless Disposition Plan:  TBD Status is: Inpatient  Objective: Blood pressure (!) 132/93, pulse 72, temperature 98 F (36.7 C), resp. rate 18, height 5\' 9"  (1.753 m), weight 90.6 kg, SpO2 97 %.  Examination:  Physical Exam Constitutional:      Appearance: Normal appearance.  HENT:     Head: Normocephalic and atraumatic.     Mouth/Throat:     Mouth: Mucous membranes are moist.     Comments: Multiple extracted teeth appreciated Eyes:     Extraocular Movements: Extraocular movements intact.  Cardiovascular:     Rate and Rhythm: Normal rate and regular rhythm.  Pulmonary:     Effort: Pulmonary effort is normal.     Breath sounds: Normal breath sounds.  Abdominal:     General: Bowel sounds are normal. There is no distension.     Palpations: Abdomen is soft.     Tenderness: There is no abdominal tenderness.  Musculoskeletal:     Cervical back: Normal range of motion and neck supple.     Comments: Left BKA noted with well healed stump. Right AKA  still appreciated  Skin:    General: Skin is warm and dry.  Neurological:     General: No focal deficit present.     Mental Status: He is alert.  Psychiatric:        Mood and Affect: Mood normal.      Consultants:  Orthopedic surgery  Procedures:  12/10/2022: Left BKA  Data Reviewed: No results found for this or any previous visit (from the past 24 hour(s)).   I have reviewed pertinent nursing notes, vitals, labs, and images as necessary. I have ordered labwork to follow up on as indicated.  I have reviewed the last notes from staff over past 24 hours. I have discussed patient's care plan and test results with nursing staff, CM/SW, and other staff as appropriate.    LOS: 113 days   Lewie Chamber, MD [facility] 03/30/2023, 2:08 PM

## 2023-03-30 NOTE — Progress Notes (Addendum)
Patient went outside with NT and returned at 1150.  Went off unit with Interior and spatial designer.  Returned at 1330.

## 2023-03-31 DIAGNOSIS — Z5900 Homelessness unspecified: Secondary | ICD-10-CM

## 2023-03-31 DIAGNOSIS — I96 Gangrene, not elsewhere classified: Secondary | ICD-10-CM | POA: Diagnosis not present

## 2023-03-31 DIAGNOSIS — K047 Periapical abscess without sinus: Secondary | ICD-10-CM | POA: Diagnosis not present

## 2023-03-31 NOTE — Plan of Care (Signed)
  Problem: Coping: Goal: Level of anxiety will decrease Outcome: Progressing   Problem: Elimination: Goal: Will not experience complications related to bowel motility Outcome: Progressing Goal: Will not experience complications related to urinary retention Outcome: Progressing   Problem: Pain Managment: Goal: General experience of comfort will improve Outcome: Progressing   Problem: Safety: Goal: Ability to remain free from injury will improve Outcome: Progressing   Problem: Skin Integrity: Goal: Risk for impaired skin integrity will decrease Outcome: Progressing   Problem: Education: Goal: Knowledge of General Education information will improve Description: Including pain rating scale, medication(s)/side effects and non-pharmacologic comfort measures Outcome: Progressing   Problem: Health Behavior/Discharge Planning: Goal: Ability to manage health-related needs will improve Outcome: Progressing

## 2023-03-31 NOTE — TOC Progression Note (Addendum)
Transition of Care [facility]) - Progression Note    Patient Details  Name: [name] MRN: [id number] Date of Birth: 1965/12/05  Transition of Care [facility]) CM/SW Contact  [name], [name] Phone Number: 03/31/2023, 9:27 AM  Clinical Narrative:    CSW spoke with patient at bedside - patient requesting assistance activating his new debit card. Patient states he attempted to activate it yesterday but was told he needed to provide two forms of identification prior to activation. CSW will call customer service to determine what is required for the card to be activated.  CSW spoke with [facility] at [employer] to obtain a way to provide patient's forms of identification. Jose provided CSW with an email - [email or web address]. CSW sent patient's demographic sheet and a copy of his ID from the media tab via secure email to the address listed above.  Expected Discharge Plan: Skilled Nursing Facility Barriers to Discharge: Continued Medical Work up, SNF Pending bed offer  Expected Discharge Plan and Services   Discharge Planning Services: CM Consult Post Acute Care Choice: Skilled Nursing Facility Living arrangements for the past 2 months: Homeless                                       Social Determinants of Health (SDOH) Interventions SDOH Screenings   Food Insecurity: Food Insecurity Present (11/24/2022)  Housing: Medium Risk (12/07/2022)  Transportation Needs: Unmet Transportation Needs (12/07/2022)  Utilities: Not At Risk (12/07/2022)  Alcohol Screen: Medium Risk (06/24/2019)  Depression (PHQ2-9): Medium Risk (02/10/2021)  Tobacco Use: High Risk (03/26/2023)    Readmission Risk Interventions    12/09/2022    9:02 AM 12/01/2022    3:56 PM 08/18/2022    9:00 AM  Readmission Risk Prevention Plan  Transportation Screening Complete Complete Complete  Medication Review Oceanographer) Complete Complete Complete  PCP or Specialist appointment within 3-5 days of discharge Complete  Complete Complete  HRI or Home Care Consult Complete Complete Complete  SW Recovery Care/Counseling Consult Complete Complete Complete  Palliative Care Screening Not Applicable Not Applicable Not Applicable  Skilled Nursing Facility Not Applicable Not Applicable Not Applicable  SNF Comments  n/a

## 2023-03-31 NOTE — Progress Notes (Signed)
Progress Note    [name]   ZOX:[id number]  DOB: 07-05-1966  DOA: 12/07/2022     114 PCP: [name], NP  Initial CC: left leg wound  Hospital Course: [name] is a 57 y.o. 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 Hospitalization complicated by difficulty disposition. He was found to have maxillary abscess and periapical abscess, patient underwent oral surgery with IN DF anterior maxillary abscess, surgical extraction of teeth 4,5,6,11,12,13,20,21,22,27,28,29. Simple extraction teeth #7,8,9,10,23,24,25,26, Alveoloplasty x 4 quadrants on 03/25/23  Interval History:  No events overnight.  Rolling around unit in wheelchair.  Tolerating solid foods with diet advancement.   Assessment and Plan:  Anterior maxillary abscess, nonrestorable dentition Gum swelling and pain Patient underwent oral surgery with IN DF anterior maxillary abscess, surgical extraction of teeth 4,5,6,11,12,13,20,21,22,27,28,29. Simple extraction teeth #7,8,9,10,23,24,25,26, Alveoloplasty x 4 quadrants on 03/25/23 Dr [name].  Appreciate input continue Augmentin x 7 days, chlorhexidine rinse x 10 days 3 times daily, soft diet- he refused to be on pureed.  -Trial of advancing diet today.  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 -Patient counseled on dietary restrictions and recommendations -Continue oral Lasix, Coreg, hydralazine, Imdur  Left BKA R AKA - discussed with Dr. Lajoyce Corners regarding his WC bound status and disposition issues on 03/30/23 - fitting for prostheses not ideal as chance of being ambulatory is minimal and he is likely expected to be permanently WC bound   Constipation cont MiraLAX , Dulcolax PRN.   Chronic pain on MS Contin 30 mg BID and oxycodone 20 milligram every 4 hourly. Will need direct supervision while taking pills since there was some concerns for hoarding the pills.   Schizoaffective disorder: Mood is stable, seen by psychiatry during hospitalization.  Continue Abilify and Lexapro.     Oral thrush received nystatin swish and swallow.    COPD Continued with nebulizers, bronchodilators as needed    Type 2 diabetes mellitus Continue with sliding scale insulin at this time    History of DVT Patient is on Eliquis for anticoagulation.    Hypertension -Continue current regimen    History of alcohol/cocaine/tobacco abuse currently no withdrawal signs at this time. Cessation done at bedside   Old records reviewed in assessment of this patient  Antimicrobials:   DVT prophylaxis:  enoxaparin (LOVENOX) injection 40 mg Start: 02/25/23 2200 SCD's Start: 12/10/22 1731   Code Status:   Code Status: Full Code  Mobility Assessment (last 72 hours)     Mobility Assessment     Row Name 03/31/23 0900 03/30/23 2200 03/30/23 0830 03/29/23 2145 03/29/23 0822   Does patient have an order for bedrest or is patient medically unstable No - Continue assessment No - Continue assessment No - Continue assessment No - Continue assessment No - Continue assessment   What is the highest level of mobility based on the progressive mobility assessment? Level 2 (  Chairfast) - Balance while sitting on edge of bed and cannot stand Level 2 (Chairfast)  - Balance while sitting on edge of bed and cannot stand Level 2 (Chairfast) - Balance while sitting on edge of bed and cannot stand Level 2 (Chairfast) - Balance while sitting on edge of bed and cannot stand Level 2 (Chairfast) - Balance while sitting on edge of bed and cannot stand   Is the above level different from baseline mobility prior to current illness? Yes - Recommend PT order Yes - Recommend PT order Yes - Recommend PT order Yes - Recommend PT order Yes - Recommend PT order    Row Name 03/28/23 2000           Does patient have an order for bedrest or is patient medically unstable No - Continue assessment       What is the highest level of mobility based on the progressive mobility assessment? Level 2 (Chairfast) - Balance while sitting on edge of bed and cannot stand       Is the above level different from baseline mobility prior to current illness? Yes - Recommend PT order                Barriers to discharge: homeless Disposition Plan:  TBD Status is: Inpatient  Objective: Blood pressure 100/61, pulse 78, temperature 98 F (36.7 C), temperature source Oral, resp. rate 16, height 5\' 9"  (1.753 m), weight 90.8 kg, SpO2 97 %.  Examination:  Physical Exam Constitutional:      Appearance: Normal appearance.  HENT:     Head: Normocephalic and atraumatic.     Mouth/Throat:     Mouth: Mucous membranes are moist.     Comments: Multiple extracted teeth appreciated Eyes:     Extraocular Movements: Extraocular movements intact.  Cardiovascular:     Rate and Rhythm: Normal rate and regular rhythm.  Pulmonary:     Effort: Pulmonary effort is normal.     Breath sounds: Normal breath sounds.  Abdominal:     General: Bowel sounds are normal. There is no distension.     Palpations: Abdomen is soft.     Tenderness: There is no abdominal tenderness.  Musculoskeletal:     Cervical back: Normal range of motion and neck supple.     Comments: Left BKA noted with well healed stump.  Right AKA still appreciated  Skin:    General: Skin is warm and dry.  Neurological:     General: No focal deficit present.     Mental Status: He is alert.  Psychiatric:        Mood and Affect: Mood normal.      Consultants:  Orthopedic surgery  Procedures:  12/10/2022: Left BKA  Data Reviewed: No results found for this or any previous visit (from the past 24 hour(s)).   I have reviewed pertinent nursing notes, vitals, labs, and images as necessary. I have ordered labwork to follow up on as indicated.  I have reviewed the last notes from staff over past 24 hours. I have discussed patient's care plan and test results with nursing staff, CM/SW, and other staff as appropriate.    LOS: 114 days   Lewie Chamber, MD [facility] 03/31/2023, 1:34 PM

## 2023-04-01 DIAGNOSIS — I96 Gangrene, not elsewhere classified: Secondary | ICD-10-CM | POA: Diagnosis not present

## 2023-04-01 LAB — CREATININE, SERUM
Creatinine, Ser: 0.86 mg/dL (ref 0.61–1.24)
GFR, Estimated: >60 mL/min (ref >60)

## 2023-04-01 NOTE — TOC Progression Note (Signed)
Transition of Care [facility]) - Progression Note    Patient Details  Name: Dantre Yearwood MRN: [id number] Date of Birth: [birth year]  [facility] CM/SW Contact  [name], [name] Phone Number: 04/01/2023, 2:22 PM  Clinical Narrative:    CSW spoke with Barbara Cower at [employer] who states the patient is not eligible for a new wheelchair until June 2028 due to him receiving one in June 2023.  CSW spoke with [facility] Director and supervisor regarding the patient's need for dentures. CSW was advised that this need would have to be addressed in the outpatient setting. CSW will obtain a dental appointment for patient once his discharge date is known.   Expected Discharge Plan: Skilled Nursing Facility Barriers to Discharge: Continued Medical Work up, SNF Pending bed offer  Expected Discharge Plan and Services   Discharge Planning Services: CM Consult Post Acute Care Choice: Skilled Nursing Facility Living arrangements for the past 2 months: Homeless                                       Social Determinants of Health (SDOH) Interventions SDOH Screenings   Food Insecurity: Food Insecurity Present (11/24/2022)  Housing: Medium Risk (12/07/2022)  Transportation Needs: Unmet Transportation Needs (12/07/2022)  Utilities: Not At Risk (12/07/2022)  Alcohol Screen: Medium Risk (06/24/2019)  Depression (PHQ2-9): Medium Risk (02/10/2021)  Tobacco Use: High Risk (03/26/2023)    Readmission Risk Interventions    12/09/2022    9:02 AM 12/01/2022    3:56 PM 08/18/2022    9:00 AM  Readmission Risk Prevention Plan  Transportation Screening Complete Complete Complete  Medication Review Oceanographer) Complete Complete Complete  PCP or Specialist appointment within 3-5 days of discharge Complete Complete Complete  HRI or Home Care Consult Complete Complete Complete  SW Recovery Care/Counseling Consult Complete Complete Complete  Palliative Care Screening Not Applicable Not Applicable Not Applicable   Skilled Nursing Facility Not Applicable Not Applicable Not Applicable  SNF Comments  n/a

## 2023-04-01 NOTE — Progress Notes (Signed)
PT Cancellation Note  Patient Details Name: [name] MRN: [id number] DOB: 1966-08-20   Cancelled Treatment:    Reason Eval/Treat Not Completed: Other (comment)  [name] politely declines PT this afternoon, about to eat lunch;  Will continue to follow;   Van Clines, PT  Acute Rehabilitation Services Office [phone]    [name] 04/01/2023, 2:12 PM

## 2023-04-01 NOTE — Plan of Care (Signed)
  Problem: Coping: Goal: Level of anxiety will decrease Outcome: Adequate for Discharge   Problem: Elimination: Goal: Will not experience complications related to bowel motility Outcome: Adequate for Discharge Goal: Will not experience complications related to urinary retention Outcome: Adequate for Discharge   

## 2023-04-01 NOTE — Progress Notes (Signed)
PROGRESS NOTE  [name]  DOB: October 02, 1966  PCP: [name], NP WUJ:[id number]  DOA: 12/07/2022  LOS: 115 days  Hospital Day: 116  Brief narrative: [name] is a 57 y.o. male with PMH significant for homelessness, DM2, multivitamin, HLD, CAD, CHF, COPD, depression, bipolar disorder, schizoaffective disorder, polyneuropathy 12/07/2022, patient presented with left lower extremity chronic venous stasis with associated cellulitis/gangrene with associated sepsis. He was admitted and managed empirically on Vancomycin,Flagyl and Cefepime  1/10, required left BKA   Hospitalization complicated by difficulty disposition. After several weeks of hospital stay, he was also found to have maxillary abscess and periapical abscess. 4/24, Patient underwent oral surgery with I&D of anterior maxillary abscess, surgical extraction of multiple teeth and alveoloplasty.  Subjective: Patient was seen and examined this morning.  He is in wheelchair, moving around the unit.  No complaints.  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 Lajoyce Corners regarding his WC bound status and disposition issues on 03/30/23. Fitting for prostheses not ideal as chance of being ambulatory is minimal and he is likely expected to be permanently wheelchair bound.   Anterior maxillary abscess, nonrestorable dentition Gum swelling and pain S/p oral surgery with I&D of anterior maxillary abscess, surgical extraction of teeth 4,5,6,11,12,13,20,21,22,27,28,29. Simple extraction teeth #7,8,9,10,23,24,25,26, Alveoloplasty x 4 quadrants on 03/25/23 Dr [name].   Completed Augmentin x 7 days Continue chlorhexidine rinse x 10 days 3 times daily Tolerating regular diet.  Acute on chronic systolic and diastolic heart failure Essential hypertension Echocardiogram 08/2022 showed left ventricular ejection fraction of less than 20% with  global hypokinesis. Currently blood pressure and volume status controlled on Coreg, Lasix, Imdur, hydralazine.   Patient has been counseled on dietary restrictions and recommendations  Type 2 diabetes mellitus Continue with sliding scale insulin at this time  History of DVT Patient is on Eliquis for anticoagulation.   COPD Continued with nebulizers, bronchodilators as needed   Chronic pain  MS Contin 30 mg BID and oxycodone 20 milligram every 4 hourly.  Will need direct supervision while taking pills since there was some concerns for hoarding the pills.   Schizoaffective disorder Mood is stable, seen by psychiatry during hospitalization.  Continue Abilify and Lexapro.     Oral thrush  received nystatin swish and swallow.    Constipation cont MiraLAX , Dulcolax PRN.      Polysubstance abuse (alcohol/cocaine/tobacco) No withdrawal symptoms.  Cessation counseled.    Mobility: On wheelchair  Goals of care   Code Status: Full Code     DVT prophylaxis:  enoxaparin (LOVENOX) injection 40 mg Start: 02/25/23 2200 SCD's Start: 12/10/22 1731   Antimicrobials: None currently Fluid: None  Status: Inpatient Level of care:  Med-Surg   Patient from: Homeless Anticipated d/c to: Medically stable but difficult disposition  Diet:  Diet Order             Diet regular Fluid consistency: Thin  Diet effective now                   Scheduled Meds:  amLODipine  10 mg Oral Daily   amoxicillin-clavulanate  1 tablet Oral Q12H   ARIPiprazole  5 mg Oral Daily   vitamin C  1,000 mg Oral Daily   carvedilol  3.125 mg Oral BID WC   chlorhexidine  15 mL Mouth/Throat TID   collagenase   Topical Daily   enoxaparin (LOVENOX) injection  40  mg Subcutaneous Q24H   escitalopram  5 mg Oral Daily   furosemide  40 mg Oral Daily   gabapentin  900 mg Oral TID   isosorbide mononitrate  15 mg Oral Daily   melatonin  3 mg Oral QHS   mometasone-formoterol  2 puff Inhalation BID   morphine   30 mg Oral Q12H   multivitamin with minerals  1 tablet Oral Daily   nystatin  5 mL Oral QID   pantoprazole  40 mg Oral Daily   thiamine  100 mg Oral Daily    PRN meds: alum & mag hydroxide-simeth, benzocaine, bisacodyl, diphenhydrAMINE, guaiFENesin-dextromethorphan, hydrALAZINE, hydrOXYzine, labetalol, levalbuterol, lidocaine, magnesium citrate, magnesium sulfate bolus IVPB, ondansetron, mouth rinse, oxyCODONE, phenol, polyethylene glycol, traMADol   Infusions:   magnesium sulfate bolus IVPB      Antimicrobials: Anti-infectives (From admission, onward)    Start     Dose/Rate Route Frequency Ordered Stop   03/19/23 0900  amoxicillin-clavulanate (AUGMENTIN) 875-125 MG per tablet 1 tablet        1 tablet Oral Every 12 hours 03/19/23 0809 04/01/23 2359   03/18/23 2000  Ampicillin-Sulbactam (UNASYN) 3 g in sodium chloride 0.9 % 100 mL IVPB  Status:  Discontinued        3 g 200 mL/hr over 30 Minutes Intravenous Every 6 hours 03/18/23 1939 03/19/23 0809   03/18/23 1645  Ampicillin-Sulbactam (UNASYN) 3 g in sodium chloride 0.9 % 100 mL IVPB  Status:  Discontinued        3 g 200 mL/hr over 30 Minutes Intravenous Every 6 hours 03/18/23 1555 03/18/23 1939   12/26/22 2200  doxycycline (VIBRA-TABS) tablet 100 mg        100 mg Oral Every 12 hours 12/26/22 1307 01/08/23 2126   12/24/22 1000  doxycycline (VIBRA-TABS) tablet 100 mg  Status:  Discontinued        100 mg Oral Every 12 hours 12/24/22 0715 12/26/22 1307   12/10/22 1830  ceFAZolin (ANCEF) IVPB 2g/100 mL premix  Status:  Discontinued        2 g 200 mL/hr over 30 Minutes Intravenous Every 8 hours 12/10/22 1731 12/10/22 1740   12/10/22 1330  ceFAZolin (ANCEF) IVPB 2g/100 mL premix        2 g 200 mL/hr over 30 Minutes Intravenous On call to O.R. 12/10/22 0959 12/10/22 1534   12/08/22 0530  vancomycin (VANCOREADY) IVPB 1500 mg/300 mL        1,500 mg 150 mL/hr over 120 Minutes Intravenous Every 12 hours 12/07/22 1741 12/11/22 0955    12/08/22 0345  metroNIDAZOLE (FLAGYL) IVPB 500 mg        500 mg 100 mL/hr over 60 Minutes Intravenous Every 12 hours 12/07/22 1742 12/12/22 0344   12/07/22 2330  ceFEPIme (MAXIPIME) 2 g in sodium chloride 0.9 % 100 mL IVPB        2 g 200 mL/hr over 30 Minutes Intravenous Every 8 hours 12/07/22 1741 12/11/22 1529   12/07/22 2200  metroNIDAZOLE (FLAGYL) IVPB 500 mg  Status:  Discontinued        500 mg 100 mL/hr over 60 Minutes Intravenous Every 12 hours 12/07/22 2048 12/07/22 2056   12/07/22 1530  ceFEPIme (MAXIPIME) 2 g in sodium chloride 0.9 % 100 mL IVPB        2 g 200 mL/hr over 30 Minutes Intravenous  Once 12/07/22 1516 12/07/22 1734   12/07/22 1530  vancomycin (VANCOREADY) IVPB 1500 mg/300 mL  1,500 mg 150 mL/hr over 120 Minutes Intravenous  Once 12/07/22 1516 12/09/22 0333   12/07/22 1515  metroNIDAZOLE (FLAGYL) IVPB 500 mg        500 mg 100 mL/hr over 60 Minutes Intravenous  Once 12/07/22 1506 12/07/22 1734   12/07/22 1500  ceFAZolin (ANCEF) IVPB 1 g/50 mL premix  Status:  Discontinued        1 g 100 mL/hr over 30 Minutes Intravenous  Once 12/07/22 1452 12/07/22 1513       Nutritional status:  Body mass index is 29.56 kg/m.          Objective: Vitals:   03/31/23 1502 03/31/23 2004  BP: 114/87 (!) 140/98  Pulse: 82 77  Resp: 17 18  Temp: 98.4 F (36.9 C) 98.5 F (36.9 C)  SpO2: 94% 95%    Intake/Output Summary (Last 24 hours) at 04/01/2023 1407 Last data filed at 04/01/2023 0435 Gross per 24 hour  Intake 1200 ml  Output 850 ml  Net 350 ml   Filed Weights   03/28/23 0408 03/29/23 0742 03/30/23 0758  Weight: 90.8 kg 90.6 kg 90.8 kg   Weight change:  Body mass index is 29.56 kg/m.   Physical Exam: General exam: Middle-aged African-American male.  Not in distress Skin: No rashes, lesions or ulcers. HEENT: Atraumatic, normocephalic, no obvious bleeding Lungs: Clear to auscultation bilaterally CVS: Regular rate and rhythm no murmur GI/Abd soft,  nontender, nondistended, bowel sound present CNS: Alert, awake, oriented x 3 Psychiatry: More awake Extremities: Wheelchair-bound status.  Left BKA, prior right AKA  Data Review: I have personally reviewed the laboratory data and studies available.  F/u labs  Unresulted Labs (From admission, onward)     Start     Ordered   03/04/23 0500  Creatinine, serum  (enoxaparin (LOVENOX)    CrCl >/= 30 ml/min)  Weekly,   R     Comments: while on enoxaparin therapy   Question:  Specimen collection method  Answer:  Lab=Lab collect   02/25/23 1637            Total time spent in review of labs and imaging, patient evaluation, formulation of plan, documentation and communication with family: 35 minutes  Signed, Lorin Glass, MD [facility] 04/01/2023

## 2023-04-02 DIAGNOSIS — I96 Gangrene, not elsewhere classified: Secondary | ICD-10-CM | POA: Diagnosis not present

## 2023-04-02 LAB — AEROBIC/ANAEROBIC CULTURE W GRAM STAIN (SURGICAL/DEEP WOUND)

## 2023-04-02 NOTE — TOC Progression Note (Signed)
Transition of Care [facility]) - Progression Note    Patient Details  Name: [name] MRN: [id number] Date of Birth: [birth year]  [facility]) CM/SW Contact  [name], [name] Phone Number: 04/02/2023, 8:06 AM  Clinical Narrative:    CSW received patient's SSN and updated ID card from [name] of the [employer].  CSW forwarded documents to [employer] to support patient's activation of his debit card.   Expected Discharge Plan: Skilled Nursing Facility Barriers to Discharge: Continued Medical Work up, SNF Pending bed offer  Expected Discharge Plan and Services   Discharge Planning Services: CM Consult Post Acute Care Choice: Skilled Nursing Facility Living arrangements for the past 2 months: Homeless                                       Social Determinants of Health (SDOH) Interventions SDOH Screenings   Food Insecurity: Food Insecurity Present (11/24/2022)  Housing: Medium Risk (12/07/2022)  Transportation Needs: Unmet Transportation Needs (12/07/2022)  Utilities: Not At Risk (12/07/2022)  Alcohol Screen: Medium Risk (06/24/2019)  Depression (PHQ2-9): Medium Risk (02/10/2021)  Tobacco Use: High Risk (03/26/2023)    Readmission Risk Interventions    12/09/2022    9:02 AM 12/01/2022    3:56 PM 08/18/2022    9:00 AM  Readmission Risk Prevention Plan  Transportation Screening Complete Complete Complete  Medication Review Oceanographer) Complete Complete Complete  PCP or Specialist appointment within 3-5 days of discharge Complete Complete Complete  HRI or Home Care Consult Complete Complete Complete  SW Recovery Care/Counseling Consult Complete Complete Complete  Palliative Care Screening Not Applicable Not Applicable Not Applicable  Skilled Nursing Facility Not Applicable Not Applicable Not Applicable  SNF Comments  n/a

## 2023-04-02 NOTE — Progress Notes (Signed)
PROGRESS NOTE  [name]  DOB: 05/08/1966  PCP: [name], NP UKG:[id number]  DOA: 12/07/2022  LOS: 116 days  Hospital Day: 117  Brief narrative: [name] is a 57 y.o. male with PMH significant for homelessness, DM2, multivitamin, HLD, CAD, CHF, COPD, depression, bipolar disorder, schizoaffective disorder, polyneuropathy 12/07/2022, patient presented with left lower extremity chronic venous stasis with associated cellulitis/gangrene with associated sepsis. He was admitted and managed empirically on Vancomycin,Flagyl and Cefepime  1/10, required left BKA   Hospitalization complicated by difficulty disposition. After several weeks of hospital stay, he was also found to have maxillary abscess and periapical abscess. 4/24, Patient underwent oral surgery with I&D of anterior maxillary abscess, surgical extraction of multiple teeth and alveoloplasty.  Subjective: Patient was seen and examined this morning.  No complaints, strolling around the unit in wheelchair.  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 Lajoyce Corners regarding his WC bound status and disposition issues on 03/30/23. Fitting for prostheses not ideal as chance of being ambulatory is minimal and he is likely expected to be permanently wheelchair bound.   Anterior maxillary abscess, nonrestorable dentition Gum swelling and pain S/p oral surgery with I&D of anterior maxillary abscess, surgical extraction of teeth 4,5,6,11,12,13,20,21,22,27,28,29. Simple extraction teeth #7,8,9,10,23,24,25,26, Alveoloplasty x 4 quadrants on 03/25/23 Dr [name].   Completed Augmentin x 7 days Continue chlorhexidine rinse x 10 days 3 times daily Tolerating regular diet.  Acute on chronic systolic and diastolic heart failure Essential hypertension Echocardiogram 08/2022 showed left ventricular ejection fraction of less than 20% with global  hypokinesis. Currently blood pressure and volume status controlled on Coreg, Lasix, Imdur, hydralazine.   Patient has been counseled on dietary restrictions and recommendations  Type 2 diabetes mellitus Continue with sliding scale insulin at this time  History of DVT Patient is on Eliquis for anticoagulation.   COPD Continued with nebulizers, bronchodilators as needed   Chronic pain  MS Contin 30 mg BID and oxycodone 20 milligram every 4 hourly.  Will need direct supervision while taking pills since there was some concerns for hoarding the pills.   Schizoaffective disorder Mood is stable, seen by psychiatry during hospitalization.  Continue Abilify and Lexapro.     Oral thrush  received nystatin swish and swallow.    Constipation cont MiraLAX , Dulcolax PRN.      Polysubstance abuse (alcohol/cocaine/tobacco) No withdrawal symptoms.  Cessation counseled.    Mobility: On wheelchair  Goals of care   Code Status: Full Code     DVT prophylaxis:  enoxaparin (LOVENOX) injection 40 mg Start: 02/25/23 2200 SCD's Start: 12/10/22 1731   Antimicrobials: None currently Fluid: None  Status: Inpatient Level of care:  Med-Surg   Patient from: Homeless Anticipated d/c to: Medically stable but difficult disposition  Diet:  Diet Order             Diet regular Fluid consistency: Thin  Diet effective now                   Scheduled Meds:  amLODipine  10 mg Oral Daily   ARIPiprazole  5 mg Oral Daily   vitamin C  1,000 mg Oral Daily   carvedilol  3.125 mg Oral BID WC   chlorhexidine  15 mL Mouth/Throat TID   collagenase   Topical Daily   enoxaparin (LOVENOX) injection  40 mg Subcutaneous Q24H   escitalopram  5 mg Oral Daily  furosemide  40 mg Oral Daily   gabapentin  900 mg Oral TID   isosorbide mononitrate  15 mg Oral Daily   melatonin  3 mg Oral QHS   mometasone-formoterol  2 puff Inhalation BID   morphine  30 mg Oral Q12H   multivitamin with minerals  1  tablet Oral Daily   nystatin  5 mL Oral QID   pantoprazole  40 mg Oral Daily   thiamine  100 mg Oral Daily    PRN meds: alum & mag hydroxide-simeth, benzocaine, bisacodyl, diphenhydrAMINE, guaiFENesin-dextromethorphan, hydrALAZINE, hydrOXYzine, labetalol, levalbuterol, lidocaine, magnesium citrate, magnesium sulfate bolus IVPB, ondansetron, mouth rinse, oxyCODONE, phenol, polyethylene glycol, traMADol   Infusions:   magnesium sulfate bolus IVPB      Antimicrobials: Anti-infectives (From admission, onward)    Start     Dose/Rate Route Frequency Ordered Stop   03/19/23 0900  amoxicillin-clavulanate (AUGMENTIN) 875-125 MG per tablet 1 tablet        1 tablet Oral Every 12 hours 03/19/23 0809 04/01/23 2118   03/18/23 2000  Ampicillin-Sulbactam (UNASYN) 3 g in sodium chloride 0.9 % 100 mL IVPB  Status:  Discontinued        3 g 200 mL/hr over 30 Minutes Intravenous Every 6 hours 03/18/23 1939 03/19/23 0809   03/18/23 1645  Ampicillin-Sulbactam (UNASYN) 3 g in sodium chloride 0.9 % 100 mL IVPB  Status:  Discontinued        3 g 200 mL/hr over 30 Minutes Intravenous Every 6 hours 03/18/23 1555 03/18/23 1939   12/26/22 2200  doxycycline (VIBRA-TABS) tablet 100 mg        100 mg Oral Every 12 hours 12/26/22 1307 01/08/23 2126   12/24/22 1000  doxycycline (VIBRA-TABS) tablet 100 mg  Status:  Discontinued        100 mg Oral Every 12 hours 12/24/22 0715 12/26/22 1307   12/10/22 1830  ceFAZolin (ANCEF) IVPB 2g/100 mL premix  Status:  Discontinued        2 g 200 mL/hr over 30 Minutes Intravenous Every 8 hours 12/10/22 1731 12/10/22 1740   12/10/22 1330  ceFAZolin (ANCEF) IVPB 2g/100 mL premix        2 g 200 mL/hr over 30 Minutes Intravenous On call to O.R. 12/10/22 0959 12/10/22 1534   12/08/22 0530  vancomycin (VANCOREADY) IVPB 1500 mg/300 mL        1,500 mg 150 mL/hr over 120 Minutes Intravenous Every 12 hours 12/07/22 1741 12/11/22 0955   12/08/22 0345  metroNIDAZOLE (FLAGYL) IVPB 500 mg         500 mg 100 mL/hr over 60 Minutes Intravenous Every 12 hours 12/07/22 1742 12/12/22 0344   12/07/22 2330  ceFEPIme (MAXIPIME) 2 g in sodium chloride 0.9 % 100 mL IVPB        2 g 200 mL/hr over 30 Minutes Intravenous Every 8 hours 12/07/22 1741 12/11/22 1529   12/07/22 2200  metroNIDAZOLE (FLAGYL) IVPB 500 mg  Status:  Discontinued        500 mg 100 mL/hr over 60 Minutes Intravenous Every 12 hours 12/07/22 2048 12/07/22 2056   12/07/22 1530  ceFEPIme (MAXIPIME) 2 g in sodium chloride 0.9 % 100 mL IVPB        2 g 200 mL/hr over 30 Minutes Intravenous  Once 12/07/22 1516 12/07/22 1734   12/07/22 1530  vancomycin (VANCOREADY) IVPB 1500 mg/300 mL        1,500 mg 150 mL/hr over 120 Minutes Intravenous  Once 12/07/22 1516  12/09/22 0333   12/07/22 1515  metroNIDAZOLE (FLAGYL) IVPB 500 mg        500 mg 100 mL/hr over 60 Minutes Intravenous  Once 12/07/22 1506 12/07/22 1734   12/07/22 1500  ceFAZolin (ANCEF) IVPB 1 g/50 mL premix  Status:  Discontinued        1 g 100 mL/hr over 30 Minutes Intravenous  Once 12/07/22 1452 12/07/22 1513       Nutritional status:  Body mass index is 29.56 kg/m.          Objective: Vitals:   04/01/23 1500 04/02/23 0807  BP: (!) 131/90 (!) 157/88  Pulse: 88 73  Resp: 17 18  Temp: 98.1 F (36.7 C) 98 F (36.7 C)  SpO2: 96% 100%   No intake or output data in the 24 hours ending 04/02/23 1334  Filed Weights   03/28/23 0408 03/29/23 0742 03/30/23 0758  Weight: 90.8 kg 90.6 kg 90.8 kg   Weight change:  Body mass index is 29.56 kg/m.   Physical Exam: General exam: Middle-aged African-American male.  Not in distress Skin: No rashes, lesions or ulcers. HEENT: Atraumatic, normocephalic, no obvious bleeding Lungs: Clear to auscultation bilaterally CVS: Regular rate and rhythm no murmur GI/Abd soft, nontender, nondistended, bowel sound present CNS: Alert, awake, oriented x 3 Psychiatry: More awake Extremities: Wheelchair-bound status.  Left BKA,  prior right AKA  Data Review: I have personally reviewed the laboratory data and studies available.  F/u labs  Unresulted Labs (From admission, onward)     Start     Ordered   03/04/23 0500  Creatinine, serum  (enoxaparin (LOVENOX)    CrCl >/= 30 ml/min)  Weekly,   R     Comments: while on enoxaparin therapy   Question:  Specimen collection method  Answer:  Lab=Lab collect   02/25/23 1637            Total time spent in review of labs and imaging, patient evaluation, formulation of plan, documentation and communication with family: 25 minutes  Signed, Lorin Glass, MD [facility] 04/02/2023

## 2023-04-03 DIAGNOSIS — I96 Gangrene, not elsewhere classified: Secondary | ICD-10-CM | POA: Diagnosis not present

## 2023-04-03 NOTE — TOC Progression Note (Signed)
Transition of Care [facility]) - Progression Note    Patient Details  Name: [name] MRN: [id number] Date of Birth: 04/15/1966  Transition of Care [facility]) CM/SW Contact  [name], [name] Phone Number: 04/03/2023, 11:27 AM  Clinical Narrative:    CSW spoke with patient briefly as he was working with Mobility Specialist in the unit gym. CSW left patient's debit card activation paperwork on his bed, per his request. CSW encouraged patient to reach out if needs or questions arise.   Expected Discharge Plan: Skilled Nursing Facility Barriers to Discharge: Continued Medical Work up, SNF Pending bed offer  Expected Discharge Plan and Services   Discharge Planning Services: CM Consult Post Acute Care Choice: Skilled Nursing Facility Living arrangements for the past 2 months: Homeless                                       Social Determinants of Health (SDOH) Interventions SDOH Screenings   Food Insecurity: Food Insecurity Present (11/24/2022)  Housing: Medium Risk (12/07/2022)  Transportation Needs: Unmet Transportation Needs (12/07/2022)  Utilities: Not At Risk (12/07/2022)  Alcohol Screen: Medium Risk (06/24/2019)  Depression (PHQ2-9): Medium Risk (02/10/2021)  Tobacco Use: High Risk (03/26/2023)    Readmission Risk Interventions    12/09/2022    9:02 AM 12/01/2022    3:56 PM 08/18/2022    9:00 AM  Readmission Risk Prevention Plan  Transportation Screening Complete Complete Complete  Medication Review Oceanographer) Complete Complete Complete  PCP or Specialist appointment within 3-5 days of discharge Complete Complete Complete  HRI or Home Care Consult Complete Complete Complete  SW Recovery Care/Counseling Consult Complete Complete Complete  Palliative Care Screening Not Applicable Not Applicable Not Applicable  Skilled Nursing Facility Not Applicable Not Applicable Not Applicable  SNF Comments  n/a

## 2023-04-03 NOTE — Progress Notes (Signed)
Mobility Specialist Progress Note:   04/03/23 1500  Mobility  Activity Ambulated independently in hallway (self-propelled outside)  Level of Assistance Modified independent, requires aide device or extra time  Assistive Device Wheelchair  Distance Ambulated (ft) 500 ft  RLE Weight Bearing NWB  LLE Weight Bearing NWB  Activity Response Tolerated well  Mobility Referral Yes  $Mobility charge 1 Mobility   Pt agreeable to mobility session outside. No physical assistance required. Pt back in room with all needs met.   [name] Mobility Specialist Please contact via SecureChat or  Rehab office at [phone]

## 2023-04-03 NOTE — Progress Notes (Signed)
Mobility Specialist Progress Note   04/03/23 1030  Mobility  Activity Ambulated independently in hallway (LE/UE exercises and AAROM)  Level of Assistance Standby assist, set-up cues, supervision of patient - no hands on  Assistive Device Wheelchair  Distance Ambulated (ft) 550 ft  Range of Motion/Exercises Active;All extremities  RLE Weight Bearing NWB  LLE Weight Bearing NWB  Activity Response Tolerated well   Patient received in wheelchair and agreeable to participate. Propelled full length of hallway independently to ortho gym. Reviewed anterior-posterior transfer from [facility] and performed without assistance. Completed various BLE and UE exercises including hip flexion/extension, adduction/abduction, gluteal activation and modified push-ups. Demonstrated all exercises and AAROM with great effort and tolerated without complaint or incident. Returned to [facility] independently and left with all needs met, call bell in reach.   [name], BS EXP Mobility Specialist Please contact via SecureChat or Rehab office at [phone]

## 2023-04-03 NOTE — Progress Notes (Signed)
PROGRESS NOTE  [name]  DOB: Oct 30, 1966  PCP: [name], NP ZOX:[id number]  DOA: 12/07/2022  LOS: 117 days  Hospital Day: 118  Brief narrative: [name] is a 57 y.o. male with PMH significant for homelessness, DM2, multivitamin, HLD, CAD, CHF, COPD, depression, bipolar disorder, schizoaffective disorder, polyneuropathy 12/07/2022, patient presented with left lower extremity chronic venous stasis with associated cellulitis/gangrene with associated sepsis. He was admitted and managed empirically on Vancomycin,Flagyl and Cefepime  1/10, required left BKA   Hospitalization complicated by difficulty disposition. After several weeks of hospital stay, he was also found to have maxillary abscess and periapical abscess. 4/24, Patient underwent oral surgery with I&D of anterior maxillary abscess, surgical extraction of multiple teeth and alveoloplasty.  Subjective: Patient was seen and examined this morning.  No complaints, strolling around the unit in wheelchair. 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 Lajoyce Corners regarding his WC bound status and disposition issues on 03/30/23. Fitting for prostheses not ideal as chance of being ambulatory is minimal and he is likely expected to be permanently wheelchair bound.   Anterior maxillary abscess, nonrestorable dentition Gum swelling and pain S/p oral surgery with I&D of anterior maxillary abscess, surgical extraction of teeth 4,5,6,11,12,13,20,21,22,27,28,29. Simple extraction teeth #7,8,9,10,23,24,25,26, Alveoloplasty x 4 quadrants on 03/25/23 Dr [name].   Completed Augmentin x 7 days Continue chlorhexidine rinse x 10 days 3 times daily Tolerating regular diet.  Acute on chronic systolic and diastolic heart failure Essential hypertension Echocardiogram 08/2022 showed left ventricular ejection  fraction of less than 20% with global hypokinesis. Currently blood pressure and volume status controlled on Coreg, Lasix, Imdur, hydralazine.   Patient has been counseled on dietary restrictions and recommendations  Type 2 diabetes mellitus Continue with sliding scale insulin at this time  History of DVT Patient is on Eliquis for anticoagulation.   COPD Continued with nebulizers, bronchodilators as needed   Chronic pain  MS Contin 30 mg BID and oxycodone 20 milligram every 4 hourly.  Will need direct supervision while taking pills since there was some concerns for hoarding the pills.   Schizoaffective disorder Mood is stable, seen by psychiatry during hospitalization.  Continue Abilify and Lexapro.     Oral thrush  received nystatin swish and swallow.    Constipation cont MiraLAX , Dulcolax PRN.      Polysubstance abuse (alcohol/cocaine/tobacco) No withdrawal symptoms.  Cessation counseled.    Mobility: On wheelchair  Goals of care   Code Status: Full Code     DVT prophylaxis:  enoxaparin (LOVENOX) injection 40 mg Start: 02/25/23 2200 SCD's Start: 12/10/22 1731   Antimicrobials: None currently Fluid: None  Status: Inpatient Level of care:  Med-Surg   Patient from: Homeless Anticipated d/c to: Medically stable but difficult disposition  Diet:  Diet Order             Diet regular Fluid consistency: Thin  Diet effective now                   Scheduled Meds:  amLODipine  10 mg Oral Daily   ARIPiprazole  5 mg Oral Daily   vitamin C  1,000 mg Oral Daily   carvedilol  3.125 mg Oral BID WC   chlorhexidine  15 mL Mouth/Throat TID   collagenase   Topical Daily   enoxaparin (LOVENOX) injection  40 mg Subcutaneous Q24H  escitalopram  5 mg Oral Daily   furosemide  40 mg Oral Daily   gabapentin  900 mg Oral TID   isosorbide mononitrate  15 mg Oral Daily   melatonin  3 mg Oral QHS   mometasone-formoterol  2 puff Inhalation BID   morphine  30 mg Oral Q12H    multivitamin with minerals  1 tablet Oral Daily   nystatin  5 mL Oral QID   pantoprazole  40 mg Oral Daily   thiamine  100 mg Oral Daily    PRN meds: alum & mag hydroxide-simeth, benzocaine, bisacodyl, diphenhydrAMINE, guaiFENesin-dextromethorphan, hydrALAZINE, hydrOXYzine, labetalol, levalbuterol, lidocaine, magnesium citrate, magnesium sulfate bolus IVPB, ondansetron, mouth rinse, oxyCODONE, phenol, polyethylene glycol, traMADol   Infusions:   magnesium sulfate bolus IVPB      Antimicrobials: Anti-infectives (From admission, onward)    Start     Dose/Rate Route Frequency Ordered Stop   03/19/23 0900  amoxicillin-clavulanate (AUGMENTIN) 875-125 MG per tablet 1 tablet        1 tablet Oral Every 12 hours 03/19/23 0809 04/01/23 2118   03/18/23 2000  Ampicillin-Sulbactam (UNASYN) 3 g in sodium chloride 0.9 % 100 mL IVPB  Status:  Discontinued        3 g 200 mL/hr over 30 Minutes Intravenous Every 6 hours 03/18/23 1939 03/19/23 0809   03/18/23 1645  Ampicillin-Sulbactam (UNASYN) 3 g in sodium chloride 0.9 % 100 mL IVPB  Status:  Discontinued        3 g 200 mL/hr over 30 Minutes Intravenous Every 6 hours 03/18/23 1555 03/18/23 1939   12/26/22 2200  doxycycline (VIBRA-TABS) tablet 100 mg        100 mg Oral Every 12 hours 12/26/22 1307 01/08/23 2126   12/24/22 1000  doxycycline (VIBRA-TABS) tablet 100 mg  Status:  Discontinued        100 mg Oral Every 12 hours 12/24/22 0715 12/26/22 1307   12/10/22 1830  ceFAZolin (ANCEF) IVPB 2g/100 mL premix  Status:  Discontinued        2 g 200 mL/hr over 30 Minutes Intravenous Every 8 hours 12/10/22 1731 12/10/22 1740   12/10/22 1330  ceFAZolin (ANCEF) IVPB 2g/100 mL premix        2 g 200 mL/hr over 30 Minutes Intravenous On call to O.R. 12/10/22 0959 12/10/22 1534   12/08/22 0530  vancomycin (VANCOREADY) IVPB 1500 mg/300 mL        1,500 mg 150 mL/hr over 120 Minutes Intravenous Every 12 hours 12/07/22 1741 12/11/22 0955   12/08/22 0345   metroNIDAZOLE (FLAGYL) IVPB 500 mg        500 mg 100 mL/hr over 60 Minutes Intravenous Every 12 hours 12/07/22 1742 12/12/22 0344   12/07/22 2330  ceFEPIme (MAXIPIME) 2 g in sodium chloride 0.9 % 100 mL IVPB        2 g 200 mL/hr over 30 Minutes Intravenous Every 8 hours 12/07/22 1741 12/11/22 1529   12/07/22 2200  metroNIDAZOLE (FLAGYL) IVPB 500 mg  Status:  Discontinued        500 mg 100 mL/hr over 60 Minutes Intravenous Every 12 hours 12/07/22 2048 12/07/22 2056   12/07/22 1530  ceFEPIme (MAXIPIME) 2 g in sodium chloride 0.9 % 100 mL IVPB        2 g 200 mL/hr over 30 Minutes Intravenous  Once 12/07/22 1516 12/07/22 1734   12/07/22 1530  vancomycin (VANCOREADY) IVPB 1500 mg/300 mL        1,500 mg 150 mL/hr  over 120 Minutes Intravenous  Once 12/07/22 1516 12/09/22 0333   12/07/22 1515  metroNIDAZOLE (FLAGYL) IVPB 500 mg        500 mg 100 mL/hr over 60 Minutes Intravenous  Once 12/07/22 1506 12/07/22 1734   12/07/22 1500  ceFAZolin (ANCEF) IVPB 1 g/50 mL premix  Status:  Discontinued        1 g 100 mL/hr over 30 Minutes Intravenous  Once 12/07/22 1452 12/07/22 1513       Nutritional status:  Body mass index is 29.56 kg/m.          Objective: Vitals:   04/03/23 0443 04/03/23 0813  BP: (!) 154/77 111/83  Pulse: 62 70  Resp: 18 17  Temp:  98.5 F (36.9 C)  SpO2: 95% 99%    Intake/Output Summary (Last 24 hours) at 04/03/2023 1111 Last data filed at 04/03/2023 0220 Gross per 24 hour  Intake 480 ml  Output --  Net 480 ml    Filed Weights   03/28/23 0408 03/29/23 0742 03/30/23 0758  Weight: 90.8 kg 90.6 kg 90.8 kg   Weight change:  Body mass index is 29.56 kg/m.   Physical Exam: General exam: Middle-aged African-American male.  Not in distress Skin: No rashes, lesions or ulcers. HEENT: Atraumatic, normocephalic, no obvious bleeding Lungs: Clear to auscultation bilaterally CVS: Regular rate and rhythm no murmur GI/Abd soft, nontender, nondistended, bowel sound  present CNS: Alert, awake, oriented x 3 Psychiatry: More awake Extremities: Wheelchair-bound status.  Left BKA, prior right AKA  Data Review: I have personally reviewed the laboratory data and studies available.  F/u labs  Unresulted Labs (From admission, onward)     Start     Ordered   03/04/23 0500  Creatinine, serum  (enoxaparin (LOVENOX)    CrCl >/= 30 ml/min)  Weekly,   R     Comments: while on enoxaparin therapy   Question:  Specimen collection method  Answer:  Lab=Lab collect   02/25/23 1637            Total time spent in review of labs and imaging, patient evaluation, formulation of plan, documentation and communication with family: 25 minutes  Signed, Lorin Glass, MD [facility] 04/03/2023

## 2023-04-04 DIAGNOSIS — I96 Gangrene, not elsewhere classified: Secondary | ICD-10-CM | POA: Diagnosis not present

## 2023-04-04 NOTE — Progress Notes (Signed)
PROGRESS NOTE  [name]  DOB: 07-07-1966  PCP: [name], NP JXB:[id number]  DOA: 12/07/2022  LOS: 118 days  Hospital Day: 119  Brief narrative: [name] is a 57 y.o. male with PMH significant for homelessness, DM2, multivitamin, HLD, CAD, CHF, COPD, depression, bipolar disorder, schizoaffective disorder, polyneuropathy 12/07/2022, patient presented with left lower extremity chronic venous stasis with associated cellulitis/gangrene with associated sepsis. He was admitted and managed empirically on Vancomycin,Flagyl and Cefepime  1/10, required left BKA   Hospitalization complicated by difficulty disposition. After several weeks of hospital stay, he was also found to have maxillary abscess and periapical abscess. 4/24, Patient underwent oral surgery with I&D of anterior maxillary abscess, surgical extraction of multiple teeth and alveoloplasty.  Subjective: Patient was seen and examined this morning.  Seen strolling around the unit on wheelchair.  Not in distress.  No acute symptoms.  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 Lajoyce Corners regarding his WC bound status and disposition issues on 03/30/23. Fitting for prostheses not ideal as chance of being ambulatory is minimal and he is likely expected to be permanently wheelchair bound.   Anterior maxillary abscess, nonrestorable dentition Gum swelling and pain S/p oral surgery with I&D of anterior maxillary abscess, surgical extraction of teeth 4,5,6,11,12,13,20,21,22,27,28,29. Simple extraction teeth #7,8,9,10,23,24,25,26, Alveoloplasty x 4 quadrants on 03/25/23 Dr [name].   Completed Augmentin x 7 days Continue chlorhexidine rinse x 10 days 3 times daily Tolerating regular diet.  Acute on chronic systolic and diastolic heart failure Essential hypertension Echocardiogram 08/2022 showed left ventricular ejection fraction  of less than 20% with global hypokinesis. Currently blood pressure and volume status controlled on Coreg, Lasix, Imdur, hydralazine.   Patient has been counseled on dietary restrictions and recommendations  Type 2 diabetes mellitus Continue with sliding scale insulin at this time  History of DVT Patient is on Eliquis for anticoagulation.   COPD Continued with nebulizers, bronchodilators as needed   Chronic pain  MS Contin 30 mg BID and oxycodone 20 milligram every 4 hourly.  Will need direct supervision while taking pills since there was some concerns for hoarding the pills.   Schizoaffective disorder Mood is stable, seen by psychiatry during hospitalization.  Continue Abilify and Lexapro.     Oral thrush  received nystatin swish and swallow.    Constipation cont MiraLAX , Dulcolax PRN.      Polysubstance abuse (alcohol/cocaine/tobacco) No withdrawal symptoms.  Cessation counseled.    Mobility: On wheelchair  Goals of care   Code Status: Full Code     DVT prophylaxis:  enoxaparin (LOVENOX) injection 40 mg Start: 02/25/23 2200 SCD's Start: 12/10/22 1731   Antimicrobials: None currently Fluid: None  Status: Inpatient Level of care:  Med-Surg   Patient from: Homeless Anticipated d/c to: Medically stable but difficult disposition  Diet:  Diet Order             Diet regular Fluid consistency: Thin  Diet effective now                   Scheduled Meds:  amLODipine  10 mg Oral Daily   ARIPiprazole  5 mg Oral Daily   vitamin C  1,000 mg Oral Daily   carvedilol  3.125 mg Oral BID WC   chlorhexidine  15 mL Mouth/Throat TID   collagenase   Topical Daily   enoxaparin (LOVENOX) injection  40 mg Subcutaneous Q24H  escitalopram  5 mg Oral Daily   furosemide  40 mg Oral Daily   gabapentin  900 mg Oral TID   isosorbide mononitrate  15 mg Oral Daily   melatonin  3 mg Oral QHS   mometasone-formoterol  2 puff Inhalation BID   morphine  30 mg Oral Q12H    multivitamin with minerals  1 tablet Oral Daily   nystatin  5 mL Oral QID   pantoprazole  40 mg Oral Daily   thiamine  100 mg Oral Daily    PRN meds: alum & mag hydroxide-simeth, benzocaine, bisacodyl, diphenhydrAMINE, guaiFENesin-dextromethorphan, hydrALAZINE, hydrOXYzine, labetalol, levalbuterol, lidocaine, magnesium citrate, magnesium sulfate bolus IVPB, ondansetron, mouth rinse, oxyCODONE, phenol, polyethylene glycol, traMADol   Infusions:   magnesium sulfate bolus IVPB      Antimicrobials: Anti-infectives (From admission, onward)    Start     Dose/Rate Route Frequency Ordered Stop   03/19/23 0900  amoxicillin-clavulanate (AUGMENTIN) 875-125 MG per tablet 1 tablet        1 tablet Oral Every 12 hours 03/19/23 0809 04/01/23 2118   03/18/23 2000  Ampicillin-Sulbactam (UNASYN) 3 g in sodium chloride 0.9 % 100 mL IVPB  Status:  Discontinued        3 g 200 mL/hr over 30 Minutes Intravenous Every 6 hours 03/18/23 1939 03/19/23 0809   03/18/23 1645  Ampicillin-Sulbactam (UNASYN) 3 g in sodium chloride 0.9 % 100 mL IVPB  Status:  Discontinued        3 g 200 mL/hr over 30 Minutes Intravenous Every 6 hours 03/18/23 1555 03/18/23 1939   12/26/22 2200  doxycycline (VIBRA-TABS) tablet 100 mg        100 mg Oral Every 12 hours 12/26/22 1307 01/08/23 2126   12/24/22 1000  doxycycline (VIBRA-TABS) tablet 100 mg  Status:  Discontinued        100 mg Oral Every 12 hours 12/24/22 0715 12/26/22 1307   12/10/22 1830  ceFAZolin (ANCEF) IVPB 2g/100 mL premix  Status:  Discontinued        2 g 200 mL/hr over 30 Minutes Intravenous Every 8 hours 12/10/22 1731 12/10/22 1740   12/10/22 1330  ceFAZolin (ANCEF) IVPB 2g/100 mL premix        2 g 200 mL/hr over 30 Minutes Intravenous On call to O.R. 12/10/22 0959 12/10/22 1534   12/08/22 0530  vancomycin (VANCOREADY) IVPB 1500 mg/300 mL        1,500 mg 150 mL/hr over 120 Minutes Intravenous Every 12 hours 12/07/22 1741 12/11/22 0955   12/08/22 0345   metroNIDAZOLE (FLAGYL) IVPB 500 mg        500 mg 100 mL/hr over 60 Minutes Intravenous Every 12 hours 12/07/22 1742 12/12/22 0344   12/07/22 2330  ceFEPIme (MAXIPIME) 2 g in sodium chloride 0.9 % 100 mL IVPB        2 g 200 mL/hr over 30 Minutes Intravenous Every 8 hours 12/07/22 1741 12/11/22 1529   12/07/22 2200  metroNIDAZOLE (FLAGYL) IVPB 500 mg  Status:  Discontinued        500 mg 100 mL/hr over 60 Minutes Intravenous Every 12 hours 12/07/22 2048 12/07/22 2056   12/07/22 1530  ceFEPIme (MAXIPIME) 2 g in sodium chloride 0.9 % 100 mL IVPB        2 g 200 mL/hr over 30 Minutes Intravenous  Once 12/07/22 1516 12/07/22 1734   12/07/22 1530  vancomycin (VANCOREADY) IVPB 1500 mg/300 mL        1,500 mg 150 mL/hr  over 120 Minutes Intravenous  Once 12/07/22 1516 12/09/22 0333   12/07/22 1515  metroNIDAZOLE (FLAGYL) IVPB 500 mg        500 mg 100 mL/hr over 60 Minutes Intravenous  Once 12/07/22 1506 12/07/22 1734   12/07/22 1500  ceFAZolin (ANCEF) IVPB 1 g/50 mL premix  Status:  Discontinued        1 g 100 mL/hr over 30 Minutes Intravenous  Once 12/07/22 1452 12/07/22 1513       Nutritional status:  Body mass index is 29.56 kg/m.          Objective: Vitals:   04/04/23 0452 04/04/23 0800  BP: (!) 155/70   Pulse: 76   Resp: 18   Temp: 98 F (36.7 C)   SpO2: 96% 97%   No intake or output data in the 24 hours ending 04/04/23 1017  Filed Weights   03/28/23 0408 03/29/23 0742 03/30/23 0758  Weight: 90.8 kg 90.6 kg 90.8 kg   Weight change:  Body mass index is 29.56 kg/m.   Physical Exam: General exam: Middle-aged African-American male.  Not in distress Skin: No rashes, lesions or ulcers. HEENT: Atraumatic, normocephalic, no obvious bleeding Lungs: Clear to auscultation bilaterally CVS: Regular rate and rhythm no murmur GI/Abd soft, nontender, nondistended, bowel sound present CNS: Alert, awake, oriented x 3 Psychiatry: More awake Extremities: Wheelchair-bound status.   Left BKA, prior right AKA  Data Review: I have personally reviewed the laboratory data and studies available.  F/u labs  Unresulted Labs (From admission, onward)     Start     Ordered   03/04/23 0500  Creatinine, serum  (enoxaparin (LOVENOX)    CrCl >/= 30 ml/min)  Weekly,   R     Comments: while on enoxaparin therapy   Question:  Specimen collection method  Answer:  Lab=Lab collect   02/25/23 1637            Total time spent in review of labs and imaging, patient evaluation, formulation of plan, documentation and communication with family: 25 minutes  Signed, Lorin Glass, MD [facility] 04/04/2023

## 2023-04-05 DIAGNOSIS — I96 Gangrene, not elsewhere classified: Secondary | ICD-10-CM | POA: Diagnosis not present

## 2023-04-05 NOTE — Progress Notes (Signed)
Writer wasted 20 mg oxycodone tabs with RN Yrneh,in there steri cycle because, one of the pill fell into trash can after opening, patient witnessed it as well. Thanks!

## 2023-04-05 NOTE — Plan of Care (Signed)
  Problem: Coping: Goal: Level of anxiety will decrease Outcome: Adequate for Discharge   Problem: Elimination: Goal: Will not experience complications related to bowel motility Outcome: Adequate for Discharge Goal: Will not experience complications related to urinary retention Outcome: Adequate for Discharge   

## 2023-04-05 NOTE — Progress Notes (Signed)
PROGRESS NOTE  [name]  DOB: [birth year]  PCP: [name] Sharps, NP ZOX:[id number]  DOA: 12/07/2022  LOS: 119 days  Hospital Day: 120  Brief narrative: [name] is a 57 y.o. male with PMH significant for homelessness, DM2, multivitamin, HLD, CAD, CHF, COPD, depression, bipolar disorder, schizoaffective disorder, polyneuropathy 12/07/2022, patient presented with left lower extremity chronic venous stasis with associated cellulitis/gangrene with associated sepsis. He was admitted and managed empirically on Vancomycin,Flagyl and Cefepime  1/10, required left BKA   Hospitalization complicated by difficulty disposition. After several weeks of hospital stay, he was also found to have maxillary abscess and periapical abscess. 4/24, Patient underwent oral surgery with I&D of anterior maxillary abscess, surgical extraction of multiple teeth and alveoloplasty.  Subjective: Patient was seen and examined this morning.  Wandering around in the unit on wheelchair..  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 Lajoyce Corners regarding his WC bound status and disposition issues on 03/30/23. Fitting for prostheses not ideal as chance of being ambulatory is minimal and he is likely expected to be permanently wheelchair bound.   Anterior maxillary abscess, nonrestorable dentition Gum swelling and pain S/p oral surgery with I&D of anterior maxillary abscess, surgical extraction of teeth 4,5,6,11,12,13,20,21,22,27,28,29. Simple extraction teeth #7,8,9,10,23,24,25,26, Alveoloplasty x 4 quadrants on 03/25/23 Dr [name].   Completed Augmentin x 7 days Continue chlorhexidine rinse x 10 days 3 times daily Tolerating regular diet.  Acute on chronic systolic and diastolic heart failure Essential hypertension Echocardiogram 08/2022 showed left ventricular ejection fraction of less than 20% with global  hypokinesis. Currently blood pressure and volume status controlled on Coreg, Lasix, Imdur, hydralazine.   Patient has been counseled on dietary restrictions and recommendations  Type 2 diabetes mellitus Continue with sliding scale insulin at this time  History of DVT Patient is on Eliquis for anticoagulation.   COPD Continued with nebulizers, bronchodilators as needed   Chronic pain  MS Contin 30 mg BID and oxycodone 20 milligram every 4 hourly.  Will need direct supervision while taking pills since there was some concerns for hoarding the pills.   Schizoaffective disorder Mood is stable, seen by psychiatry during hospitalization.  Continue Abilify and Lexapro.     Oral thrush  received nystatin swish and swallow.    Constipation cont MiraLAX , Dulcolax PRN.      Polysubstance abuse (alcohol/cocaine/tobacco) No withdrawal symptoms.  Cessation counseled.    Mobility: On wheelchair  Goals of care   Code Status: Full Code     DVT prophylaxis:  enoxaparin (LOVENOX) injection 40 mg Start: 02/25/23 2200 SCD's Start: 12/10/22 1731   Antimicrobials: None currently Fluid: None  Status: Inpatient Level of care:  Med-Surg   Patient from: Homeless Anticipated d/c to: Medically stable but difficult disposition  Diet:  Diet Order             Diet regular Fluid consistency: Thin  Diet effective now                   Scheduled Meds:  amLODipine  10 mg Oral Daily   ARIPiprazole  5 mg Oral Daily   vitamin C  1,000 mg Oral Daily   carvedilol  3.125 mg Oral BID WC   chlorhexidine  15 mL Mouth/Throat TID   collagenase   Topical Daily   enoxaparin (LOVENOX) injection  40 mg Subcutaneous Q24H   escitalopram  5 mg Oral Daily  furosemide  40 mg Oral Daily   gabapentin  900 mg Oral TID   isosorbide mononitrate  15 mg Oral Daily   melatonin  3 mg Oral QHS   mometasone-formoterol  2 puff Inhalation BID   morphine  30 mg Oral Q12H   multivitamin with minerals  1  tablet Oral Daily   nystatin  5 mL Oral QID   pantoprazole  40 mg Oral Daily   thiamine  100 mg Oral Daily    PRN meds: alum & mag hydroxide-simeth, benzocaine, bisacodyl, diphenhydrAMINE, guaiFENesin-dextromethorphan, hydrALAZINE, hydrOXYzine, labetalol, levalbuterol, lidocaine, magnesium citrate, magnesium sulfate bolus IVPB, ondansetron, mouth rinse, oxyCODONE, phenol, polyethylene glycol, traMADol   Infusions:   magnesium sulfate bolus IVPB      Antimicrobials: Anti-infectives (From admission, onward)    Start     Dose/Rate Route Frequency Ordered Stop   03/19/23 0900  amoxicillin-clavulanate (AUGMENTIN) 875-125 MG per tablet 1 tablet        1 tablet Oral Every 12 hours 03/19/23 0809 04/01/23 2118   03/18/23 2000  Ampicillin-Sulbactam (UNASYN) 3 g in sodium chloride 0.9 % 100 mL IVPB  Status:  Discontinued        3 g 200 mL/hr over 30 Minutes Intravenous Every 6 hours 03/18/23 1939 03/19/23 0809   03/18/23 1645  Ampicillin-Sulbactam (UNASYN) 3 g in sodium chloride 0.9 % 100 mL IVPB  Status:  Discontinued        3 g 200 mL/hr over 30 Minutes Intravenous Every 6 hours 03/18/23 1555 03/18/23 1939   12/26/22 2200  doxycycline (VIBRA-TABS) tablet 100 mg        100 mg Oral Every 12 hours 12/26/22 1307 01/08/23 2126   12/24/22 1000  doxycycline (VIBRA-TABS) tablet 100 mg  Status:  Discontinued        100 mg Oral Every 12 hours 12/24/22 0715 12/26/22 1307   12/10/22 1830  ceFAZolin (ANCEF) IVPB 2g/100 mL premix  Status:  Discontinued        2 g 200 mL/hr over 30 Minutes Intravenous Every 8 hours 12/10/22 1731 12/10/22 1740   12/10/22 1330  ceFAZolin (ANCEF) IVPB 2g/100 mL premix        2 g 200 mL/hr over 30 Minutes Intravenous On call to O.R. 12/10/22 0959 12/10/22 1534   12/08/22 0530  vancomycin (VANCOREADY) IVPB 1500 mg/300 mL        1,500 mg 150 mL/hr over 120 Minutes Intravenous Every 12 hours 12/07/22 1741 12/11/22 0955   12/08/22 0345  metroNIDAZOLE (FLAGYL) IVPB 500 mg         500 mg 100 mL/hr over 60 Minutes Intravenous Every 12 hours 12/07/22 1742 12/12/22 0344   12/07/22 2330  ceFEPIme (MAXIPIME) 2 g in sodium chloride 0.9 % 100 mL IVPB        2 g 200 mL/hr over 30 Minutes Intravenous Every 8 hours 12/07/22 1741 12/11/22 1529   12/07/22 2200  metroNIDAZOLE (FLAGYL) IVPB 500 mg  Status:  Discontinued        500 mg 100 mL/hr over 60 Minutes Intravenous Every 12 hours 12/07/22 2048 12/07/22 2056   12/07/22 1530  ceFEPIme (MAXIPIME) 2 g in sodium chloride 0.9 % 100 mL IVPB        2 g 200 mL/hr over 30 Minutes Intravenous  Once 12/07/22 1516 12/07/22 1734   12/07/22 1530  vancomycin (VANCOREADY) IVPB 1500 mg/300 mL        1,500 mg 150 mL/hr over 120 Minutes Intravenous  Once 12/07/22 1516  12/09/22 0333   12/07/22 1515  metroNIDAZOLE (FLAGYL) IVPB 500 mg        500 mg 100 mL/hr over 60 Minutes Intravenous  Once 12/07/22 1506 12/07/22 1734   12/07/22 1500  ceFAZolin (ANCEF) IVPB 1 g/50 mL premix  Status:  Discontinued        1 g 100 mL/hr over 30 Minutes Intravenous  Once 12/07/22 1452 12/07/22 1513       Nutritional status:  Body mass index is 29.56 kg/m.          Objective: Vitals:   04/05/23 0655 04/05/23 0721  BP: (!) 145/93 (!) 162/83  Pulse: 69 64  Resp: 16 17  Temp: 97.7 F (36.5 C) 97.8 F (36.6 C)  SpO2: 99% 100%    Intake/Output Summary (Last 24 hours) at 04/05/2023 1142 Last data filed at 04/04/2023 1956 Gross per 24 hour  Intake 840 ml  Output 350 ml  Net 490 ml    Filed Weights   03/28/23 0408 03/29/23 0742 03/30/23 0758  Weight: 90.8 kg 90.6 kg 90.8 kg   Weight change:  Body mass index is 29.56 kg/m.   Physical Exam: General exam: Middle-aged African-American male.  Not in distress Skin: No rashes, lesions or ulcers. HEENT: Atraumatic, normocephalic, no obvious bleeding Lungs: Clear to auscultation bilaterally CVS: Regular rate and rhythm no murmur GI/Abd soft, nontender, nondistended, bowel sound present CNS:  Alert, awake, oriented x 3 Psychiatry: More awake Extremities: Wheelchair-bound status.  Left BKA, prior right AKA  Data Review: I have personally reviewed the laboratory data and studies available.  F/u labs  Unresulted Labs (From admission, onward)     Start     Ordered   03/04/23 0500  Creatinine, serum  (enoxaparin (LOVENOX)    CrCl >/= 30 ml/min)  Weekly,   R     Comments: while on enoxaparin therapy   Question:  Specimen collection method  Answer:  Lab=Lab collect   02/25/23 1637            Total time spent in review of labs and imaging, patient evaluation, formulation of plan, documentation and communication with family: 25 minutes  Signed, Lorin Glass, MD [facility] 04/05/2023

## 2023-04-05 NOTE — Plan of Care (Signed)
  Problem: Education: Goal: Knowledge of General Education information will improve Description: Including pain rating scale, medication(s)/side effects and non-pharmacologic comfort measures Outcome: Progressing   Problem: Nutrition: Goal: Adequate nutrition will be maintained Outcome: Progressing   

## 2023-04-05 NOTE — Plan of Care (Addendum)
@  1300 Patient threatening to leave. Patient requesting a discharge order, I have notified Dr. Pola Corn and I have educated patient there has to be a safe discharge plan for patient to be discharged otherwise patient may leave AMA. Dr. Pola Corn aware of situation.   Problem: Pain Managment: Goal: General experience of comfort will improve Outcome: Progressing   Problem: Safety: Goal: Ability to remain free from injury will improve Outcome: Progressing   Problem: Skin Integrity: Goal: Risk for impaired skin integrity will decrease Outcome: Progressing

## 2023-04-06 NOTE — Plan of Care (Addendum)
Patient educated on medications and has full understanding of medications and medication use.   Problem: Pain Managment: Goal: General experience of comfort will improve Outcome: Progressing   Problem: Safety: Goal: Ability to remain free from injury will improve Outcome: Progressing   Problem: Skin Integrity: Goal: Risk for impaired skin integrity will decrease Outcome: Progressing   Problem: Education: Goal: Knowledge of General Education information will improve Description: Including pain rating scale, medication(s)/side effects and non-pharmacologic comfort measures Outcome: Progressing

## 2023-04-06 NOTE — TOC Progression Note (Signed)
Transition of Care [facility]) - Progression Note    Patient Details  Name: [name] MRN: [id number] Date of Birth: 1966-09-26  [facility] CM/SW Contact  [name], [name] Phone Number: 04/06/2023, 11:07 AM  Clinical Narrative:    CSW spoke with MD about patient's desire to leave AMA yesterday. CSW informed MD that patient has capacity to make his own decisions and he has the right to leave AMA if desired.  CSW spoke with patient to discuss yesterday's events. Patient reports he is tired of being in the hospital and wants to get back into the community. Patient understands that if he leaves AMA he is jeopardizing the housing that [facility] staff is working on obtaining. Patient states he is in a better mood today and that he "got over it." Patient to reach out to CSW if any needs arise.   Expected Discharge Plan: Skilled Nursing Facility Barriers to Discharge: Continued Medical Work up, SNF Pending bed offer  Expected Discharge Plan and Services   Discharge Planning Services: CM Consult Post Acute Care Choice: Skilled Nursing Facility Living arrangements for the past 2 months: Homeless                                       Social Determinants of Health (SDOH) Interventions SDOH Screenings   Food Insecurity: Food Insecurity Present (11/24/2022)  Housing: Medium Risk (12/07/2022)  Transportation Needs: Unmet Transportation Needs (12/07/2022)  Utilities: Not At Risk (12/07/2022)  Alcohol Screen: Medium Risk (06/24/2019)  Depression (PHQ2-9): Medium Risk (02/10/2021)  Tobacco Use: High Risk (03/26/2023)    Readmission Risk Interventions    12/09/2022    9:02 AM 12/01/2022    3:56 PM 08/18/2022    9:00 AM  Readmission Risk Prevention Plan  Transportation Screening Complete Complete Complete  Medication Review Oceanographer) Complete Complete Complete  PCP or Specialist appointment within 3-5 days of discharge Complete Complete Complete  HRI or Home Care  Consult Complete Complete Complete  SW Recovery Care/Counseling Consult Complete Complete Complete  Palliative Care Screening Not Applicable Not Applicable Not Applicable  Skilled Nursing Facility Not Applicable Not Applicable Not Applicable  SNF Comments  n/a

## 2023-04-06 NOTE — Progress Notes (Signed)
PROGRESS NOTE  [name]  DOB: 1966-04-22  PCP: [name], NP UJW:[id number]  DOA: 12/07/2022  LOS: 120 days  Hospital Day: 121  Brief narrative: [name] is a 57 y.o. male with PMH significant for homelessness, DM2, multivitamin, HLD, CAD, CHF, COPD, depression, bipolar disorder, schizoaffective disorder, polyneuropathy 12/07/2022, patient presented with left lower extremity chronic venous stasis with associated cellulitis/gangrene with associated sepsis. He was admitted and managed empirically on Vancomycin,Flagyl and Cefepime  1/10, required left BKA   Hospitalization complicated by difficulty disposition. After several weeks of hospital stay, he was also found to have maxillary abscess and periapical abscess. 4/24, Patient underwent oral surgery with I&D of anterior maxillary abscess, surgical extraction of multiple teeth and alveoloplasty.  Subjective: Patient was seen and examined this morning.  Sitting up at the edge of the bed.  Not in distress.  We talked about his behavioral issues yesterday.  He was agitated and wanted to leave AMA at some point.  In a good mood today.  Waiting to talk to case worker.  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 Lajoyce Corners regarding his WC bound status and disposition issues on 03/30/23. Fitting for prostheses not ideal as chance of being ambulatory is minimal and he is likely expected to be permanently wheelchair bound.   Anterior maxillary abscess, nonrestorable dentition Gum swelling and pain S/p oral surgery with I&D of anterior maxillary abscess, surgical extraction of teeth 4,5,6,11,12,13,20,21,22,27,28,29. Simple extraction teeth #7,8,9,10,23,24,25,26, Alveoloplasty x 4 quadrants on 03/25/23 Dr [name].   Completed Augmentin x 7 days Continue chlorhexidine rinse x 10 days 3 times daily Tolerating regular diet.  Acute on  chronic systolic and diastolic heart failure Essential hypertension Echocardiogram 08/2022 showed left ventricular ejection fraction of less than 20% with global hypokinesis. Currently blood pressure and volume status controlled on Coreg, Lasix, Imdur, hydralazine.   Patient has been counseled on dietary restrictions and recommendations  Type 2 diabetes mellitus Continue with sliding scale insulin at this time  History of DVT Patient is on Eliquis for anticoagulation.   COPD Continued with nebulizers, bronchodilators as needed   Chronic pain  MS Contin 30 mg BID and oxycodone 20 milligram every 4 hourly.  Will need direct supervision while taking pills since there was some concerns for hoarding the pills.   Schizoaffective disorder Mood is stable, seen by psychiatry during hospitalization.  Continue Abilify and Lexapro.     Oral thrush  received nystatin swish and swallow.    Constipation cont MiraLAX , Dulcolax PRN.      Polysubstance abuse (alcohol/cocaine/tobacco) No withdrawal symptoms.  Cessation counseled.    Mobility: On wheelchair  Goals of care   Code Status: Full Code     DVT prophylaxis:  enoxaparin (LOVENOX) injection 40 mg Start: 02/25/23 2200 SCD's Start: 12/10/22 1731   Antimicrobials: None currently Fluid: None  Status: Inpatient Level of care:  Med-Surg   Patient from: Homeless Anticipated d/c to: Medically stable but difficult disposition.  Case management involved  Diet:  Diet Order             Diet regular Fluid consistency: Thin  Diet effective now                   Scheduled Meds:  amLODipine  10 mg Oral Daily   ARIPiprazole  5 mg Oral Daily   vitamin C  1,000 mg Oral Daily  carvedilol  3.125 mg Oral BID WC   chlorhexidine  15 mL Mouth/Throat TID   collagenase   Topical Daily   enoxaparin (LOVENOX) injection  40 mg Subcutaneous Q24H   escitalopram  5 mg Oral Daily   furosemide  40 mg Oral Daily   gabapentin  900 mg Oral  TID   isosorbide mononitrate  15 mg Oral Daily   melatonin  3 mg Oral QHS   mometasone-formoterol  2 puff Inhalation BID   morphine  30 mg Oral Q12H   multivitamin with minerals  1 tablet Oral Daily   nystatin  5 mL Oral QID   pantoprazole  40 mg Oral Daily   thiamine  100 mg Oral Daily    PRN meds: alum & mag hydroxide-simeth, benzocaine, bisacodyl, diphenhydrAMINE, guaiFENesin-dextromethorphan, hydrALAZINE, hydrOXYzine, labetalol, levalbuterol, lidocaine, magnesium citrate, magnesium sulfate bolus IVPB, ondansetron, mouth rinse, oxyCODONE, phenol, polyethylene glycol, traMADol   Infusions:   magnesium sulfate bolus IVPB      Antimicrobials: Anti-infectives (From admission, onward)    Start     Dose/Rate Route Frequency Ordered Stop   03/19/23 0900  amoxicillin-clavulanate (AUGMENTIN) 875-125 MG per tablet 1 tablet        1 tablet Oral Every 12 hours 03/19/23 0809 04/01/23 2118   03/18/23 2000  Ampicillin-Sulbactam (UNASYN) 3 g in sodium chloride 0.9 % 100 mL IVPB  Status:  Discontinued        3 g 200 mL/hr over 30 Minutes Intravenous Every 6 hours 03/18/23 1939 03/19/23 0809   03/18/23 1645  Ampicillin-Sulbactam (UNASYN) 3 g in sodium chloride 0.9 % 100 mL IVPB  Status:  Discontinued        3 g 200 mL/hr over 30 Minutes Intravenous Every 6 hours 03/18/23 1555 03/18/23 1939   12/26/22 2200  doxycycline (VIBRA-TABS) tablet 100 mg        100 mg Oral Every 12 hours 12/26/22 1307 01/08/23 2126   12/24/22 1000  doxycycline (VIBRA-TABS) tablet 100 mg  Status:  Discontinued        100 mg Oral Every 12 hours 12/24/22 0715 12/26/22 1307   12/10/22 1830  ceFAZolin (ANCEF) IVPB 2g/100 mL premix  Status:  Discontinued        2 g 200 mL/hr over 30 Minutes Intravenous Every 8 hours 12/10/22 1731 12/10/22 1740   12/10/22 1330  ceFAZolin (ANCEF) IVPB 2g/100 mL premix        2 g 200 mL/hr over 30 Minutes Intravenous On call to O.R. 12/10/22 0959 12/10/22 1534   12/08/22 0530  vancomycin  (VANCOREADY) IVPB 1500 mg/300 mL        1,500 mg 150 mL/hr over 120 Minutes Intravenous Every 12 hours 12/07/22 1741 12/11/22 0955   12/08/22 0345  metroNIDAZOLE (FLAGYL) IVPB 500 mg        500 mg 100 mL/hr over 60 Minutes Intravenous Every 12 hours 12/07/22 1742 12/12/22 0344   12/07/22 2330  ceFEPIme (MAXIPIME) 2 g in sodium chloride 0.9 % 100 mL IVPB        2 g 200 mL/hr over 30 Minutes Intravenous Every 8 hours 12/07/22 1741 12/11/22 1529   12/07/22 2200  metroNIDAZOLE (FLAGYL) IVPB 500 mg  Status:  Discontinued        500 mg 100 mL/hr over 60 Minutes Intravenous Every 12 hours 12/07/22 2048 12/07/22 2056   12/07/22 1530  ceFEPIme (MAXIPIME) 2 g in sodium chloride 0.9 % 100 mL IVPB        2 g  200 mL/hr over 30 Minutes Intravenous  Once 12/07/22 1516 12/07/22 1734   12/07/22 1530  vancomycin (VANCOREADY) IVPB 1500 mg/300 mL        1,500 mg 150 mL/hr over 120 Minutes Intravenous  Once 12/07/22 1516 12/09/22 0333   12/07/22 1515  metroNIDAZOLE (FLAGYL) IVPB 500 mg        500 mg 100 mL/hr over 60 Minutes Intravenous  Once 12/07/22 1506 12/07/22 1734   12/07/22 1500  ceFAZolin (ANCEF) IVPB 1 g/50 mL premix  Status:  Discontinued        1 g 100 mL/hr over 30 Minutes Intravenous  Once 12/07/22 1452 12/07/22 1513       Nutritional status:  Body mass index is 29.56 kg/m.          Objective: Vitals:   04/06/23 0757 04/06/23 0803  BP: (!) 161/94   Pulse: 65   Resp: 16   Temp: 97.7 F (36.5 C)   SpO2: 92% 93%   No intake or output data in the 24 hours ending 04/06/23 1059  Filed Weights   03/28/23 0408 03/29/23 0742 03/30/23 0758  Weight: 90.8 kg 90.6 kg 90.8 kg   Weight change:  Body mass index is 29.56 kg/m.   Physical Exam: General exam: Middle-aged African-American male.  Not in distress Skin: No rashes, lesions or ulcers. HEENT: Atraumatic, normocephalic, no obvious bleeding Lungs: Clear to auscultation bilaterally CVS: Regular rate and rhythm no  murmur GI/Abd soft, nontender, nondistended, bowel sound present CNS: Alert, awake, oriented x 3 Psychiatry: More awake Extremities: Wheelchair-bound status.  Left BKA, prior right AKA  Data Review: I have personally reviewed the laboratory data and studies available.  F/u labs  Unresulted Labs (From admission, onward)     Start     Ordered   03/04/23 0500  Creatinine, serum  (enoxaparin (LOVENOX)    CrCl >/= 30 ml/min)  Weekly,   R     Comments: while on enoxaparin therapy   Question:  Specimen collection method  Answer:  Lab=Lab collect   02/25/23 1637            Total time spent in review of labs and imaging, patient evaluation, formulation of plan, documentation and communication with family: 25 minutes  Signed, Lorin Glass, MD [facility] 04/06/2023

## 2023-04-06 NOTE — Progress Notes (Signed)
Mobility Specialist Progress Note   04/06/23 1532  Mobility  Activity Ambulated independently in hallway;Repositioned in chair  Level of Assistance Independent  Assistive Device Wheelchair  Distance Ambulated (ft) 550 ft  Range of Motion/Exercises Active   Patient received in room eager to participate. Session focused on anterior-posterior transfer and sizing/fitting [name] for his new wheelchair. Completed transfer to and from [facility] with supervision. Deemed WC an appropriate fit. Propelled in hallway 516ft independently. Returned to room without complaint or incident. Was left at the bedside with all needs met.  [name], BS EXP Mobility Specialist Please contact via SecureChat or Rehab office at [phone]

## 2023-04-06 NOTE — Plan of Care (Signed)
  Problem: Coping: Goal: Level of anxiety will decrease Outcome: Adequate for Discharge   Problem: Elimination: Goal: Will not experience complications related to bowel motility Outcome: Adequate for Discharge Goal: Will not experience complications related to urinary retention Outcome: Adequate for Discharge   

## 2023-04-07 NOTE — Progress Notes (Signed)
PT Cancellation Note  Patient Details Name: [name] MRN: [id number] DOB: November 01, 1966   Cancelled Treatment:    Reason Eval/Treat Not Completed: (P) Patient declined, no reason specified (Pt stating "I don't feel like doing anything today" and asking therapy to check back tomorrow. Will continue to follow per PT POC.)   Johny Shock 04/07/2023, 3:28 PM

## 2023-04-07 NOTE — Progress Notes (Signed)
   04/07/23 0700  Charting Type  Charting Type Shift Assessment  [place] Work Intensity Score (Update with each assessment and as needed)  Work Intensity Score (Level) 2  Level 1 Intensity A.Stable patient awaiting discharge orders and teaching  Neurological  Neuro (WDL) WDL  Orientation Level Oriented X4  Cognition Appropriate at baseline  Speech Clear  Neurological  Level of Consciousness Alert      Patient refused assessment. He states, " I don't need all that".

## 2023-04-07 NOTE — TOC Progression Note (Signed)
Transition of Care [facility]) - Progression Note    Patient Details  Name: Sahaj Raj MRN: [id number] Date of Birth: 1965/12/10  [facility] CM/SW Contact  [name], [name] Phone Number: 04/07/2023, 2:44 PM  Clinical Narrative:    CSW spoke with patient at bedside who was on the phone with [place] from [place]. CSW spoke with Lorayne Marek who is requesting photos of the residence [place] is interested in.  CSW contacted Helmut Muster [facility] owner to request she send photos to [facility] for review - she is agreeable and will provide the needed information.  Expected Discharge Plan: Skilled Nursing Facility Barriers to Discharge: Continued Medical Work up, SNF Pending bed offer  Expected Discharge Plan and Services   Discharge Planning Services: CM Consult Post Acute Care Choice: Skilled Nursing Facility Living arrangements for the past 2 months: Homeless                                       Social Determinants of Health (SDOH) Interventions SDOH Screenings   Food Insecurity: Food Insecurity Present (11/24/2022)  Housing: Medium Risk (12/07/2022)  Transportation Needs: Unmet Transportation Needs (12/07/2022)  Utilities: Not At Risk (12/07/2022)  Alcohol Screen: Medium Risk (06/24/2019)  Depression (PHQ2-9): Medium Risk (02/10/2021)  Tobacco Use: High Risk (03/26/2023)    Readmission Risk Interventions    12/09/2022    9:02 AM 12/01/2022    3:56 PM 08/18/2022    9:00 AM  Readmission Risk Prevention Plan  Transportation Screening Complete Complete Complete  Medication Review Oceanographer) Complete Complete Complete  PCP or Specialist appointment within 3-5 days of discharge Complete Complete Complete  HRI or Home Care Consult Complete Complete Complete  SW Recovery Care/Counseling Consult Complete Complete Complete  Palliative Care Screening Not Applicable Not Applicable Not Applicable  Skilled Nursing Facility Not Applicable Not Applicable Not Applicable  SNF  Comments  n/a

## 2023-04-07 NOTE — Progress Notes (Signed)
PROGRESS NOTE  [name]  DOB: 1966-06-26  PCP: [name], NP NWG:[id number]  DOA: 12/07/2022  LOS: 121 days  Hospital Day: 122  Brief narrative: [name] is a 57 y.o. male with PMH significant for homelessness, DM2, multivitamin, HLD, CAD, CHF, COPD, depression, bipolar disorder, schizoaffective disorder, polyneuropathy 12/07/2022, patient presented with left lower extremity chronic venous stasis with associated cellulitis/gangrene with associated sepsis. He was admitted and managed empirically on Vancomycin,Flagyl and Cefepime  1/10, required left BKA   Hospitalization complicated by difficulty disposition. After several weeks of hospital stay, he was also found to have maxillary abscess and periapical abscess. 4/24, Patient underwent oral surgery with I&D of anterior maxillary abscess, surgical extraction of multiple teeth and alveoloplasty.  Subjective: Patient was seen and examined this morning.  Rolling around on the hallway on wheelchair.  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 Lajoyce Corners regarding his WC bound status and disposition issues on 03/30/23. Fitting for prostheses not ideal as chance of being ambulatory is minimal and he is likely expected to be permanently wheelchair bound.   Anterior maxillary abscess, nonrestorable dentition Gum swelling and pain S/p oral surgery with I&D of anterior maxillary abscess, surgical extraction of teeth 4,5,6,11,12,13,20,21,22,27,28,29. Simple extraction teeth #7,8,9,10,23,24,25,26, Alveoloplasty x 4 quadrants on 03/25/23 Dr [name].   Completed Augmentin x 7 days Continue chlorhexidine rinse x 10 days 3 times daily Tolerating regular diet.  Acute on chronic systolic and diastolic heart failure Essential hypertension Echocardiogram 08/2022 showed left ventricular ejection fraction of less than 20%  with global hypokinesis. Currently blood pressure and volume status controlled on Coreg, Lasix, Imdur, hydralazine.   Patient has been counseled on dietary restrictions and recommendations  Type 2 diabetes mellitus Continue with sliding scale insulin at this time  History of DVT Patient is on Eliquis for anticoagulation.   COPD Continued with nebulizers, bronchodilators as needed   Chronic pain  MS Contin 30 mg BID and oxycodone 20 milligram every 4 hourly.  Will need direct supervision while taking pills since there was some concerns for hoarding the pills.   Schizoaffective disorder Mood is stable, seen by psychiatry during hospitalization.  Continue Abilify and Lexapro.     Oral thrush  received nystatin swish and swallow.    Constipation cont MiraLAX , Dulcolax PRN.      Polysubstance abuse (alcohol/cocaine/tobacco) No withdrawal symptoms.  Cessation counseled.    Mobility: On wheelchair  Goals of care   Code Status: Full Code     DVT prophylaxis:  enoxaparin (LOVENOX) injection 40 mg Start: 02/25/23 2200 SCD's Start: 12/10/22 1731   Antimicrobials: None currently Fluid: None  Status: Inpatient Level of care:  Med-Surg   Patient from: Homeless Anticipated d/c to: Medically stable but difficult disposition.  Case management involved  Diet:  Diet Order             Diet regular Fluid consistency: Thin  Diet effective now                   Scheduled Meds:  amLODipine  10 mg Oral Daily   ARIPiprazole  5 mg Oral Daily   vitamin C  1,000 mg Oral Daily   carvedilol  3.125 mg Oral BID WC   chlorhexidine  15 mL Mouth/Throat TID   collagenase   Topical Daily   enoxaparin (LOVENOX) injection  40 mg Subcutaneous Q24H  escitalopram  5 mg Oral Daily   furosemide  40 mg Oral Daily   gabapentin  900 mg Oral TID   isosorbide mononitrate  15 mg Oral Daily   melatonin  3 mg Oral QHS   mometasone-formoterol  2 puff Inhalation BID   morphine  30 mg Oral Q12H    multivitamin with minerals  1 tablet Oral Daily   nystatin  5 mL Oral QID   pantoprazole  40 mg Oral Daily   thiamine  100 mg Oral Daily    PRN meds: alum & mag hydroxide-simeth, benzocaine, bisacodyl, diphenhydrAMINE, guaiFENesin-dextromethorphan, hydrALAZINE, hydrOXYzine, labetalol, levalbuterol, lidocaine, magnesium citrate, magnesium sulfate bolus IVPB, ondansetron, mouth rinse, oxyCODONE, phenol, polyethylene glycol, traMADol   Infusions:   magnesium sulfate bolus IVPB      Antimicrobials: Anti-infectives (From admission, onward)    Start     Dose/Rate Route Frequency Ordered Stop   03/19/23 0900  amoxicillin-clavulanate (AUGMENTIN) 875-125 MG per tablet 1 tablet        1 tablet Oral Every 12 hours 03/19/23 0809 04/01/23 2118   03/18/23 2000  Ampicillin-Sulbactam (UNASYN) 3 g in sodium chloride 0.9 % 100 mL IVPB  Status:  Discontinued        3 g 200 mL/hr over 30 Minutes Intravenous Every 6 hours 03/18/23 1939 03/19/23 0809   03/18/23 1645  Ampicillin-Sulbactam (UNASYN) 3 g in sodium chloride 0.9 % 100 mL IVPB  Status:  Discontinued        3 g 200 mL/hr over 30 Minutes Intravenous Every 6 hours 03/18/23 1555 03/18/23 1939   12/26/22 2200  doxycycline (VIBRA-TABS) tablet 100 mg        100 mg Oral Every 12 hours 12/26/22 1307 01/08/23 2126   12/24/22 1000  doxycycline (VIBRA-TABS) tablet 100 mg  Status:  Discontinued        100 mg Oral Every 12 hours 12/24/22 0715 12/26/22 1307   12/10/22 1830  ceFAZolin (ANCEF) IVPB 2g/100 mL premix  Status:  Discontinued        2 g 200 mL/hr over 30 Minutes Intravenous Every 8 hours 12/10/22 1731 12/10/22 1740   12/10/22 1330  ceFAZolin (ANCEF) IVPB 2g/100 mL premix        2 g 200 mL/hr over 30 Minutes Intravenous On call to O.R. 12/10/22 0959 12/10/22 1534   12/08/22 0530  vancomycin (VANCOREADY) IVPB 1500 mg/300 mL        1,500 mg 150 mL/hr over 120 Minutes Intravenous Every 12 hours 12/07/22 1741 12/11/22 0955   12/08/22 0345   metroNIDAZOLE (FLAGYL) IVPB 500 mg        500 mg 100 mL/hr over 60 Minutes Intravenous Every 12 hours 12/07/22 1742 12/12/22 0344   12/07/22 2330  ceFEPIme (MAXIPIME) 2 g in sodium chloride 0.9 % 100 mL IVPB        2 g 200 mL/hr over 30 Minutes Intravenous Every 8 hours 12/07/22 1741 12/11/22 1529   12/07/22 2200  metroNIDAZOLE (FLAGYL) IVPB 500 mg  Status:  Discontinued        500 mg 100 mL/hr over 60 Minutes Intravenous Every 12 hours 12/07/22 2048 12/07/22 2056   12/07/22 1530  ceFEPIme (MAXIPIME) 2 g in sodium chloride 0.9 % 100 mL IVPB        2 g 200 mL/hr over 30 Minutes Intravenous  Once 12/07/22 1516 12/07/22 1734   12/07/22 1530  vancomycin (VANCOREADY) IVPB 1500 mg/300 mL        1,500 mg 150 mL/hr  over 120 Minutes Intravenous  Once 12/07/22 1516 12/09/22 0333   12/07/22 1515  metroNIDAZOLE (FLAGYL) IVPB 500 mg        500 mg 100 mL/hr over 60 Minutes Intravenous  Once 12/07/22 1506 12/07/22 1734   12/07/22 1500  ceFAZolin (ANCEF) IVPB 1 g/50 mL premix  Status:  Discontinued        1 g 100 mL/hr over 30 Minutes Intravenous  Once 12/07/22 1452 12/07/22 1513       Nutritional status:  Body mass index is 29.56 kg/m.          Objective: Vitals:   04/06/23 2022 04/07/23 0759  BP: (!) 160/99 (!) 146/92  Pulse: 95 74  Resp: 17 17  Temp: 98 F (36.7 C) 98.3 F (36.8 C)  SpO2: 96% 96%    Intake/Output Summary (Last 24 hours) at 04/07/2023 1330 Last data filed at 04/07/2023 0900 Gross per 24 hour  Intake 240 ml  Output --  Net 240 ml    Filed Weights   03/28/23 0408 03/29/23 0742 03/30/23 0758  Weight: 90.8 kg 90.6 kg 90.8 kg   Weight change:  Body mass index is 29.56 kg/m.   Physical Exam: General exam: Middle-aged African-American male.  Not in distress Skin: No rashes, lesions or ulcers. HEENT: Atraumatic, normocephalic, no obvious bleeding Lungs: Clear to auscultation bilaterally CVS: Regular rate and rhythm no murmur GI/Abd soft, nontender,  nondistended, bowel sound present CNS: Alert, awake, oriented x 3 Psychiatry: More awake Extremities: Wheelchair-bound status.  Left BKA, prior right AKA  Data Review: I have personally reviewed the laboratory data and studies available.  F/u labs  Unresulted Labs (From admission, onward)     Start     Ordered   03/04/23 0500  Creatinine, serum  (enoxaparin (LOVENOX)    CrCl >/= 30 ml/min)  Weekly,   R     Comments: while on enoxaparin therapy   Question:  Specimen collection method  Answer:  Lab=Lab collect   02/25/23 1637            Total time spent in review of labs and imaging, patient evaluation, formulation of plan, documentation and communication with family: 25 minutes  Signed, Lorin Glass, MD [facility] 04/07/2023

## 2023-04-08 NOTE — Progress Notes (Signed)
PROGRESS NOTE  [name]  DOB: [birth year]  PCP: [name], NP UJW:[id number]  DOA: 12/07/2022  LOS: 122 days  Hospital Day: 123  Brief narrative: [name] is a 57 y.o. male with PMH significant for homelessness, DM2, multivitamin, HLD, CAD, CHF, COPD, depression, bipolar disorder, schizoaffective disorder, polyneuropathy 12/07/2022, patient presented with left lower extremity chronic venous stasis with associated cellulitis/gangrene with associated sepsis. He was admitted and managed empirically on Vancomycin,Flagyl and Cefepime  1/10, required left BKA   Hospitalization complicated by difficulty disposition. After several weeks of hospital stay, he was also found to have maxillary abscess and periapical abscess. 4/24, Patient underwent oral surgery with I&D of anterior maxillary abscess, surgical extraction of multiple teeth and alveoloplasty.  Subjective: Patient was seen and examined this morning.  Not in distress.  Was due to get creatinine weekly checked today.  Does not want it.  He states he has not been accepting Lovenox shots anyway. 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 Lajoyce Corners regarding his WC bound status and disposition issues on 03/30/23. Fitting for prostheses not ideal as chance of being ambulatory is minimal and he is likely expected to be permanently wheelchair bound.   Anterior maxillary abscess, nonrestorable dentition Gum swelling and pain S/p oral surgery with I&D of anterior maxillary abscess, surgical extraction of teeth 4,5,6,11,12,13,20,21,22,27,28,29. Simple extraction teeth #7,8,9,10,23,24,25,26, Alveoloplasty x 4 quadrants on 03/25/23 Dr [name].   Completed Augmentin x 7 days Continue chlorhexidine rinse x 10 days 3 times daily Tolerating regular diet.  Acute on chronic systolic and diastolic heart failure Essential  hypertension Echocardiogram 08/2022 showed left ventricular ejection fraction of less than 20% with global hypokinesis. Currently blood pressure and volume status controlled on Coreg, Lasix, Imdur, hydralazine.   Patient has been counseled on dietary restrictions and recommendations  Type 2 diabetes mellitus Continue with sliding scale insulin at this time  History of DVT Patient is on Eliquis for anticoagulation.   COPD Continued with nebulizers, bronchodilators as needed   Chronic pain  MS Contin 30 mg BID and oxycodone 20 milligram every 4 hourly.  Will need direct supervision while taking pills since there was some concerns for hoarding the pills.   Schizoaffective disorder Mood is stable, seen by psychiatry during hospitalization.  Continue Abilify and Lexapro.     Oral thrush  received nystatin swish and swallow.    Constipation cont MiraLAX , Dulcolax PRN.      Polysubstance abuse (alcohol/cocaine/tobacco) No withdrawal symptoms.  Cessation counseled.    Mobility: On wheelchair  Goals of care   Code Status: Full Code     DVT prophylaxis:  enoxaparin (LOVENOX) injection 40 mg Start: 02/25/23 2200 SCD's Start: 12/10/22 1731   Antimicrobials: None currently Fluid: None  Status: Inpatient Level of care:  Med-Surg   Patient from: Homeless Anticipated d/c to: Medically stable but difficult disposition.  Case management involved  Diet:  Diet Order             Diet regular Fluid consistency: Thin  Diet effective now                   Scheduled Meds:  amLODipine  10 mg Oral Daily   ARIPiprazole  5 mg Oral Daily   vitamin C  1,000 mg Oral Daily   carvedilol  3.125 mg Oral BID WC   chlorhexidine  15 mL Mouth/Throat  TID   collagenase   Topical Daily   enoxaparin (LOVENOX) injection  40 mg Subcutaneous Q24H   escitalopram  5 mg Oral Daily   furosemide  40 mg Oral Daily   gabapentin  900 mg Oral TID   isosorbide mononitrate  15 mg Oral Daily    melatonin  3 mg Oral QHS   mometasone-formoterol  2 puff Inhalation BID   morphine  30 mg Oral Q12H   multivitamin with minerals  1 tablet Oral Daily   nystatin  5 mL Oral QID   pantoprazole  40 mg Oral Daily   thiamine  100 mg Oral Daily    PRN meds: alum & mag hydroxide-simeth, benzocaine, bisacodyl, diphenhydrAMINE, guaiFENesin-dextromethorphan, hydrALAZINE, hydrOXYzine, labetalol, levalbuterol, lidocaine, magnesium citrate, magnesium sulfate bolus IVPB, ondansetron, mouth rinse, oxyCODONE, phenol, polyethylene glycol, traMADol   Infusions:   magnesium sulfate bolus IVPB      Antimicrobials: Anti-infectives (From admission, onward)    Start     Dose/Rate Route Frequency Ordered Stop   03/19/23 0900  amoxicillin-clavulanate (AUGMENTIN) 875-125 MG per tablet 1 tablet        1 tablet Oral Every 12 hours 03/19/23 0809 04/01/23 2118   03/18/23 2000  Ampicillin-Sulbactam (UNASYN) 3 g in sodium chloride 0.9 % 100 mL IVPB  Status:  Discontinued        3 g 200 mL/hr over 30 Minutes Intravenous Every 6 hours 03/18/23 1939 03/19/23 0809   03/18/23 1645  Ampicillin-Sulbactam (UNASYN) 3 g in sodium chloride 0.9 % 100 mL IVPB  Status:  Discontinued        3 g 200 mL/hr over 30 Minutes Intravenous Every 6 hours 03/18/23 1555 03/18/23 1939   12/26/22 2200  doxycycline (VIBRA-TABS) tablet 100 mg        100 mg Oral Every 12 hours 12/26/22 1307 01/08/23 2126   12/24/22 1000  doxycycline (VIBRA-TABS) tablet 100 mg  Status:  Discontinued        100 mg Oral Every 12 hours 12/24/22 0715 12/26/22 1307   12/10/22 1830  ceFAZolin (ANCEF) IVPB 2g/100 mL premix  Status:  Discontinued        2 g 200 mL/hr over 30 Minutes Intravenous Every 8 hours 12/10/22 1731 12/10/22 1740   12/10/22 1330  ceFAZolin (ANCEF) IVPB 2g/100 mL premix        2 g 200 mL/hr over 30 Minutes Intravenous On call to O.R. 12/10/22 0959 12/10/22 1534   12/08/22 0530  vancomycin (VANCOREADY) IVPB 1500 mg/300 mL        1,500 mg 150  mL/hr over 120 Minutes Intravenous Every 12 hours 12/07/22 1741 12/11/22 0955   12/08/22 0345  metroNIDAZOLE (FLAGYL) IVPB 500 mg        500 mg 100 mL/hr over 60 Minutes Intravenous Every 12 hours 12/07/22 1742 12/12/22 0344   12/07/22 2330  ceFEPIme (MAXIPIME) 2 g in sodium chloride 0.9 % 100 mL IVPB        2 g 200 mL/hr over 30 Minutes Intravenous Every 8 hours 12/07/22 1741 12/11/22 1529   12/07/22 2200  metroNIDAZOLE (FLAGYL) IVPB 500 mg  Status:  Discontinued        500 mg 100 mL/hr over 60 Minutes Intravenous Every 12 hours 12/07/22 2048 12/07/22 2056   12/07/22 1530  ceFEPIme (MAXIPIME) 2 g in sodium chloride 0.9 % 100 mL IVPB        2 g 200 mL/hr over 30 Minutes Intravenous  Once 12/07/22 1516 12/07/22 1734  12/07/22 1530  vancomycin (VANCOREADY) IVPB 1500 mg/300 mL        1,500 mg 150 mL/hr over 120 Minutes Intravenous  Once 12/07/22 1516 12/09/22 0333   12/07/22 1515  metroNIDAZOLE (FLAGYL) IVPB 500 mg        500 mg 100 mL/hr over 60 Minutes Intravenous  Once 12/07/22 1506 12/07/22 1734   12/07/22 1500  ceFAZolin (ANCEF) IVPB 1 g/50 mL premix  Status:  Discontinued        1 g 100 mL/hr over 30 Minutes Intravenous  Once 12/07/22 1452 12/07/22 1513       Nutritional status:  Body mass index is 29.56 kg/m.          Objective: Vitals:   04/07/23 2001 04/08/23 0755  BP: 130/78 126/85  Pulse: 70 65  Resp: 16 18  Temp: 98.7 F (37.1 C) 98 F (36.7 C)  SpO2: 97% 94%    Intake/Output Summary (Last 24 hours) at 04/08/2023 1148 Last data filed at 04/07/2023 1700 Gross per 24 hour  Intake 480 ml  Output --  Net 480 ml    Filed Weights   03/28/23 0408 03/29/23 0742 03/30/23 0758  Weight: 90.8 kg 90.6 kg 90.8 kg   Weight change:  Body mass index is 29.56 kg/m.   Physical Exam: General exam: Middle-aged African-American male.  Not in distress Skin: No rashes, lesions or ulcers. HEENT: Atraumatic, normocephalic, no obvious bleeding Lungs: Clear to  auscultation bilaterally CVS: Regular rate and rhythm no murmur GI/Abd soft, nontender, nondistended, bowel sound present CNS: Alert, awake, oriented x 3 Psychiatry: More awake Extremities: Wheelchair-bound status.  Left BKA, prior right AKA  Data Review: I have personally reviewed the laboratory data and studies available.  F/u labs  Unresulted Labs (From admission, onward)     Start     Ordered   03/04/23 0500  Creatinine, serum  (enoxaparin (LOVENOX)    CrCl >/= 30 ml/min)  Weekly,   R (with TIMED occurrences)     Comments: while on enoxaparin therapy   Question:  Specimen collection method  Answer:  Lab=Lab collect   02/25/23 1637            Total time spent in review of labs and imaging, patient evaluation, formulation of plan, documentation and communication with family: 25 minutes  Signed, Lorin Glass, MD [facility] 04/08/2023

## 2023-04-08 NOTE — Progress Notes (Signed)
Education provided regarding the tobacco policy. Patient verbalizes understanding.

## 2023-04-08 NOTE — Progress Notes (Signed)
Patient refused evening doses of scheduled medication.  Education was provided regarding taking medication as prescribed.

## 2023-04-08 NOTE — Progress Notes (Signed)
Physical Therapy Treatment Patient Details Name: [name] MRN: [id number] DOB: 04-15-1966 Today's Date: 04/08/2023   History of Present Illness Pt is a 57 y/o M who presented 12/17/22 with L ankle cellulitis and underwent L BKA on 1/10. Oral abscesses noted, underwent oral surgery Wed, 4/24; PMH includes asthma, bioplar 1, depression, schizoaffective disorder, CVA, chronic CHF, homelessness, HTN, DM2, cocaine and cannabis use, and R AKA.    PT Comments    Pt received sitting in wheelchair and agreeable to session. Pt demonstrating anterior/posterior transfers from the wheelchair <> the mat table with mod I. Pt able to roll to L and R side with min A for manual hip flexor stretching and exercises. Pt able to perform core exercises with cues for technique in longsitting. Pt reporting pain at the end of R residual limb that "feels like I'm standing on a cup" with WB. Pt provided with a green theraband for increased challenge with exercises. Pt continues to benefit from PT services to progress toward functional mobility goals.     Recommendations for follow up therapy are one component of a multi-disciplinary discharge planning process, led by the attending physician.  Recommendations may be updated based on patient status, additional functional criteria and insurance authorization.     Assistance Recommended at Discharge PRN  Patient can return home with the following Assist for transportation   Equipment Recommendations  Wheelchair (measurements PT);Other (comment)    Recommendations for Other Services       Precautions / Restrictions Precautions Precautions: Fall Precaution Comments: R knee disrticulation, L BKA Restrictions Weight Bearing Restrictions: No     Mobility  Bed Mobility Overal bed mobility: Independent                  Transfers Overall transfer level: Modified independent Equipment used: None Transfers: Bed to chair/wheelchair/BSC          Anterior-Posterior transfers: Modified independent (Device/Increase time)   General transfer comment: From wc <> mat table       Balance Overall balance assessment: Needs assistance Sitting-balance support: Feet unsupported, No upper extremity supported Sitting balance-Leahy Scale: Normal Sitting balance - Comments: seated in w/c and longsitting on mat table       Standing balance comment: unable                            Cognition Arousal/Alertness: Awake/alert Behavior During Therapy: WFL for tasks assessed/performed Overall Cognitive Status: Within Functional Limits for tasks assessed                                          Exercises General Exercises - Lower Extremity Hip ABduction/ADduction: AROM, Sidelying, Both, 10 reps Other Exercises Other Exercises: Sidelying manual hip flexor stretch BLE 30 secs x3 reps Other Exercises: Longsitting BUE press outs with green TB 2 sets x10 reps    General Comments        Pertinent Vitals/Pain Pain Assessment Pain Assessment: Faces Faces Pain Scale: Hurts a little bit Pain Location: end of R limb Pain Descriptors / Indicators: Grimacing Pain Intervention(s): Monitored during session     PT Goals (current goals can now be found in the care plan section) Acute Rehab PT Goals Patient Stated Goal: Prepare for prosthetics fitting; Would like to get housing in [facility] through the Transition to [employer] PT Goal  Formulation: With patient Time For Goal Achievement: 04/01/23 Potential to Achieve Goals: Good Progress towards PT goals: Progressing toward goals    Frequency    Min 1X/week      PT Plan Current plan remains appropriate       AM-PAC PT "6 Clicks" Mobility   Outcome Measure  Help needed turning from your back to your side while in a flat bed without using bedrails?: None Help needed moving from lying on your back to sitting on the side of a flat bed  without using bedrails?: None Help needed moving to and from a bed to a chair (including a wheelchair)?: None Help needed standing up from a chair using your arms (e.g., wheelchair or bedside chair)?: Total Help needed to walk in hospital room?: Total Help needed climbing 3-5 steps with a railing? : Total 6 Click Score: 15    End of Session   Activity Tolerance: Patient tolerated treatment well Patient left: Other (comment) (seated in wheelchair in room) Nurse Communication: Mobility status PT Visit Diagnosis: Muscle weakness (generalized) (M62.81);Other abnormalities of gait and mobility (R26.89)     Time: 1610-9604 PT Time Calculation (min) (ACUTE ONLY): 33 min  Charges:  $Therapeutic Exercise: 23-37 mins                     Johny Shock, PTA Acute Rehabilitation Services Secure Chat Preferred  [phone]    Johny Shock 04/08/2023, 2:54 PM

## 2023-04-09 NOTE — TOC Progression Note (Signed)
Transition of Care [facility]) - Progression Note    Patient Details  Name: [name] MRN: [id number] Date of Birth: 02-24-1966  [facility] CM/SW Contact  [name], [name] Phone Number: 04/09/2023, 9:48 AM  Clinical Narrative:    CSW spoke with patient via phone to answer questions regarding information he was given by [facility] staff. All questions answered - patient to return call to CSW with any additional needs.   Expected Discharge Plan: Skilled Nursing Facility Barriers to Discharge: Continued Medical Work up, SNF Pending bed offer  Expected Discharge Plan and Services   Discharge Planning Services: CM Consult Post Acute Care Choice: Skilled Nursing Facility Living arrangements for the past 2 months: Homeless                                       Social Determinants of Health (SDOH) Interventions SDOH Screenings   Food Insecurity: Food Insecurity Present (11/24/2022)  Housing: Medium Risk (12/07/2022)  Transportation Needs: Unmet Transportation Needs (12/07/2022)  Utilities: Not At Risk (12/07/2022)  Alcohol Screen: Medium Risk (06/24/2019)  Depression (PHQ2-9): Medium Risk (02/10/2021)  Tobacco Use: High Risk (03/26/2023)    Readmission Risk Interventions    12/09/2022    9:02 AM 12/01/2022    3:56 PM 08/18/2022    9:00 AM  Readmission Risk Prevention Plan  Transportation Screening Complete Complete Complete  Medication Review Oceanographer) Complete Complete Complete  PCP or Specialist appointment within 3-5 days of discharge Complete Complete Complete  HRI or Home Care Consult Complete Complete Complete  SW Recovery Care/Counseling Consult Complete Complete Complete  Palliative Care Screening Not Applicable Not Applicable Not Applicable  Skilled Nursing Facility Not Applicable Not Applicable Not Applicable  SNF Comments  n/a

## 2023-04-09 NOTE — Progress Notes (Signed)
PROGRESS NOTE  [name]  DOB: [birth year]  PCP: [name], NP ZOX:[id number]  DOA: 12/07/2022  LOS: 123 days  Hospital Day: 124  Brief narrative: [name] is a 57 y.o. male with PMH significant for homelessness, DM2, multivitamin, HLD, CAD, CHF, COPD, depression, bipolar disorder, schizoaffective disorder, polyneuropathy 12/07/2022, patient presented with left lower extremity chronic venous stasis with associated cellulitis/gangrene with associated sepsis. He was admitted and managed empirically on Vancomycin,Flagyl and Cefepime  1/10, required left BKA   Hospitalization complicated by difficulty disposition. After several weeks of hospital stay, he was also found to have maxillary abscess and periapical abscess. 4/24, Patient underwent oral surgery with I&D of anterior maxillary abscess, surgical extraction of multiple teeth and alveoloplasty.  Subjective: Patient was seen and examined this morning.  Strolling around on the hallway on wheelchair.  Not in distress.  He wants to get out of the hospital and expects it will be next week.  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 Lajoyce Corners regarding his WC bound status and disposition issues on 03/30/23. Fitting for prostheses not ideal as chance of being ambulatory is minimal and he is likely expected to be permanently wheelchair bound.   Anterior maxillary abscess, nonrestorable dentition Gum swelling and pain S/p oral surgery with I&D of anterior maxillary abscess, surgical extraction of teeth 4,5,6,11,12,13,20,21,22,27,28,29. Simple extraction teeth #7,8,9,10,23,24,25,26, Alveoloplasty x 4 quadrants on 03/25/23 Dr [name].   Completed Augmentin x 7 days Continue chlorhexidine rinse x 10 days 3 times daily Tolerating regular diet.  Acute on chronic systolic and diastolic heart failure Essential  hypertension Echocardiogram 08/2022 showed left ventricular ejection fraction of less than 20% with global hypokinesis. Currently blood pressure and volume status controlled on Coreg, Lasix, Imdur, hydralazine.   Patient has been counseled on dietary restrictions and recommendations  Type 2 diabetes mellitus Continue with sliding scale insulin at this time  History of DVT Patient is on Eliquis for anticoagulation.   COPD Continued with nebulizers, bronchodilators as needed   Chronic pain  MS Contin 30 mg BID and oxycodone 20 milligram every 4 hourly.  Will need direct supervision while taking pills since there was some concerns for hoarding the pills.   Schizoaffective disorder Mood is stable, seen by psychiatry during hospitalization.  Continue Abilify and Lexapro.     Oral thrush  received nystatin swish and swallow.    Constipation cont MiraLAX , Dulcolax PRN.      Polysubstance abuse (alcohol/cocaine/tobacco) No withdrawal symptoms.  Cessation counseled.    Mobility: On wheelchair  Goals of care   Code Status: Full Code     DVT prophylaxis:  enoxaparin (LOVENOX) injection 40 mg Start: 02/25/23 2200 SCD's Start: 12/10/22 1731   Antimicrobials: None currently Fluid: None  Status: Inpatient Level of care:  Med-Surg   Patient from: Homeless Anticipated d/c to: Medically stable but difficult disposition.  Case management involved  Diet:  Diet Order             Diet regular Fluid consistency: Thin  Diet effective now                   Scheduled Meds:  amLODipine  10 mg Oral Daily   ARIPiprazole  5 mg Oral Daily   vitamin C  1,000 mg Oral Daily   carvedilol  3.125 mg Oral BID WC   chlorhexidine  15  mL Mouth/Throat TID   collagenase   Topical Daily   enoxaparin (LOVENOX) injection  40 mg Subcutaneous Q24H   escitalopram  5 mg Oral Daily   furosemide  40 mg Oral Daily   gabapentin  900 mg Oral TID   isosorbide mononitrate  15 mg Oral Daily    melatonin  3 mg Oral QHS   mometasone-formoterol  2 puff Inhalation BID   morphine  30 mg Oral Q12H   multivitamin with minerals  1 tablet Oral Daily   nystatin  5 mL Oral QID   pantoprazole  40 mg Oral Daily   thiamine  100 mg Oral Daily    PRN meds: alum & mag hydroxide-simeth, benzocaine, bisacodyl, diphenhydrAMINE, guaiFENesin-dextromethorphan, hydrALAZINE, hydrOXYzine, labetalol, levalbuterol, lidocaine, magnesium citrate, magnesium sulfate bolus IVPB, ondansetron, mouth rinse, oxyCODONE, phenol, polyethylene glycol, traMADol   Infusions:   magnesium sulfate bolus IVPB      Antimicrobials: Anti-infectives (From admission, onward)    Start     Dose/Rate Route Frequency Ordered Stop   03/19/23 0900  amoxicillin-clavulanate (AUGMENTIN) 875-125 MG per tablet 1 tablet        1 tablet Oral Every 12 hours 03/19/23 0809 04/01/23 2118   03/18/23 2000  Ampicillin-Sulbactam (UNASYN) 3 g in sodium chloride 0.9 % 100 mL IVPB  Status:  Discontinued        3 g 200 mL/hr over 30 Minutes Intravenous Every 6 hours 03/18/23 1939 03/19/23 0809   03/18/23 1645  Ampicillin-Sulbactam (UNASYN) 3 g in sodium chloride 0.9 % 100 mL IVPB  Status:  Discontinued        3 g 200 mL/hr over 30 Minutes Intravenous Every 6 hours 03/18/23 1555 03/18/23 1939   12/26/22 2200  doxycycline (VIBRA-TABS) tablet 100 mg        100 mg Oral Every 12 hours 12/26/22 1307 01/08/23 2126   12/24/22 1000  doxycycline (VIBRA-TABS) tablet 100 mg  Status:  Discontinued        100 mg Oral Every 12 hours 12/24/22 0715 12/26/22 1307   12/10/22 1830  ceFAZolin (ANCEF) IVPB 2g/100 mL premix  Status:  Discontinued        2 g 200 mL/hr over 30 Minutes Intravenous Every 8 hours 12/10/22 1731 12/10/22 1740   12/10/22 1330  ceFAZolin (ANCEF) IVPB 2g/100 mL premix        2 g 200 mL/hr over 30 Minutes Intravenous On call to O.R. 12/10/22 0959 12/10/22 1534   12/08/22 0530  vancomycin (VANCOREADY) IVPB 1500 mg/300 mL        1,500 mg 150  mL/hr over 120 Minutes Intravenous Every 12 hours 12/07/22 1741 12/11/22 0955   12/08/22 0345  metroNIDAZOLE (FLAGYL) IVPB 500 mg        500 mg 100 mL/hr over 60 Minutes Intravenous Every 12 hours 12/07/22 1742 12/12/22 0344   12/07/22 2330  ceFEPIme (MAXIPIME) 2 g in sodium chloride 0.9 % 100 mL IVPB        2 g 200 mL/hr over 30 Minutes Intravenous Every 8 hours 12/07/22 1741 12/11/22 1529   12/07/22 2200  metroNIDAZOLE (FLAGYL) IVPB 500 mg  Status:  Discontinued        500 mg 100 mL/hr over 60 Minutes Intravenous Every 12 hours 12/07/22 2048 12/07/22 2056   12/07/22 1530  ceFEPIme (MAXIPIME) 2 g in sodium chloride 0.9 % 100 mL IVPB        2 g 200 mL/hr over 30 Minutes Intravenous  Once 12/07/22 1516 12/07/22 1734  12/07/22 1530  vancomycin (VANCOREADY) IVPB 1500 mg/300 mL        1,500 mg 150 mL/hr over 120 Minutes Intravenous  Once 12/07/22 1516 12/09/22 0333   12/07/22 1515  metroNIDAZOLE (FLAGYL) IVPB 500 mg        500 mg 100 mL/hr over 60 Minutes Intravenous  Once 12/07/22 1506 12/07/22 1734   12/07/22 1500  ceFAZolin (ANCEF) IVPB 1 g/50 mL premix  Status:  Discontinued        1 g 100 mL/hr over 30 Minutes Intravenous  Once 12/07/22 1452 12/07/22 1513       Nutritional status:  Body mass index is 29.56 kg/m.          Objective: Vitals:   04/08/23 2039 04/09/23 0853  BP: (!) 182/103 (!) 159/103  Pulse: 95 79  Resp: 19 18  Temp: 97.6 F (36.4 C) 98.8 F (37.1 C)  SpO2: 94% 94%   No intake or output data in the 24 hours ending 04/09/23 1358  Filed Weights   03/28/23 0408 03/29/23 0742 03/30/23 0758  Weight: 90.8 kg 90.6 kg 90.8 kg   Weight change:  Body mass index is 29.56 kg/m.   Physical Exam: General exam: Middle-aged African-American male.  Not in distress Skin: No rashes, lesions or ulcers. HEENT: Atraumatic, normocephalic, no obvious bleeding Lungs: Clear to auscultation bilaterally CVS: Regular rate and rhythm no murmur GI/Abd soft, nontender,  nondistended, bowel sound present CNS: Alert, awake, oriented x 3 Psychiatry: More awake Extremities: Wheelchair-bound status.  Left BKA, prior right AKA  Data Review: I have personally reviewed the laboratory data and studies available.  F/u labs  Unresulted Labs (From admission, onward)     Start     Ordered   03/04/23 0500  Creatinine, serum  (enoxaparin (LOVENOX)    CrCl >/= 30 ml/min)  Weekly,   R     Comments: while on enoxaparin therapy   Question:  Specimen collection method  Answer:  Lab=Lab collect   02/25/23 1637            Total time spent in review of labs and imaging, patient evaluation, formulation of plan, documentation and communication with family: 25 minutes  Signed, Lorin Glass, MD [facility] 04/09/2023

## 2023-04-10 NOTE — TOC Progression Note (Signed)
Transition of Care [facility]) - Progression Note    Patient Details  Name: [name] MRN: [id number] Date of Birth: [birth year]  Transition of Care [facility]) CM/SW Contact  [name], [name] Phone Number: 04/10/2023, 11:09 AM  Clinical Narrative:    CSW spoke with patient at bedside to assist with his debit card.   CSW sent patient's ID and SS card to [employer] again for review to assist patient in activating his new debit card.  Expected Discharge Plan: Skilled Nursing Facility Barriers to Discharge: Continued Medical Work up, SNF Pending bed offer  Expected Discharge Plan and Services   Discharge Planning Services: CM Consult Post Acute Care Choice: Skilled Nursing Facility Living arrangements for the past 2 months: Homeless                                       Social Determinants of Health (SDOH) Interventions SDOH Screenings   Food Insecurity: Food Insecurity Present (11/24/2022)  Housing: Medium Risk (12/07/2022)  Transportation Needs: Unmet Transportation Needs (12/07/2022)  Utilities: Not At Risk (12/07/2022)  Alcohol Screen: Medium Risk (06/24/2019)  Depression (PHQ2-9): Medium Risk (02/10/2021)  Tobacco Use: High Risk (03/26/2023)    Readmission Risk Interventions    12/09/2022    9:02 AM 12/01/2022    3:56 PM 08/18/2022    9:00 AM  Readmission Risk Prevention Plan  Transportation Screening Complete Complete Complete  Medication Review Oceanographer) Complete Complete Complete  PCP or Specialist appointment within 3-5 days of discharge Complete Complete Complete  HRI or Home Care Consult Complete Complete Complete  SW Recovery Care/Counseling Consult Complete Complete Complete  Palliative Care Screening Not Applicable Not Applicable Not Applicable  Skilled Nursing Facility Not Applicable Not Applicable Not Applicable  SNF Comments  n/a

## 2023-04-10 NOTE — Progress Notes (Signed)
Mobility Specialist Progress Note:   04/10/23 1628  Mobility  Activity Ambulated independently in hallway (self-propelled w/c)  Level of Assistance Modified independent, requires aide device or extra time  Assistive Device Wheelchair  Distance Ambulated (ft) 500 ft  RLE Weight Bearing NWB  LLE Weight Bearing NWB  Activity Response Tolerated well  Mobility Referral Yes  $Mobility charge 1 Mobility  Mobility Specialist Start Time (ACUTE ONLY) 1600  Mobility Specialist Stop Time (ACUTE ONLY) 1625  Mobility Specialist Time Calculation (min) (ACUTE ONLY) 25 min   Pt eager for mobility session. Self-propelled w/c outside. No c/o throughout. Pt back in room with all needs met.  Thompson Grayer Mobility Specialist  Please contact vis Secure Chat or  Rehab Office [phone]

## 2023-04-10 NOTE — Progress Notes (Signed)
PROGRESS NOTE  [name]  DOB: 06-06-1966  PCP: [name], NP ZOX:[id number]  DOA: 12/07/2022  LOS: 124 days  Hospital Day: 125  Brief narrative: [name] is a 57 y.o. male with PMH significant for homelessness, DM2, multivitamin, HLD, CAD, CHF, COPD, depression, bipolar disorder, schizoaffective disorder, polyneuropathy 12/07/2022, patient presented with left lower extremity chronic venous stasis with associated cellulitis/gangrene with associated sepsis. He was admitted and managed empirically on Vancomycin,Flagyl and Cefepime  1/10, required left BKA   Hospitalization complicated by difficulty disposition. After several weeks of hospital stay, he was also found to have maxillary abscess and periapical abscess. 4/24, Patient underwent oral surgery with I&D of anterior maxillary abscess, surgical extraction of multiple teeth and alveoloplasty.  Subjective: Patient was seen and examined this morning.  No active medical issues. He was talking to caseworker about disposition plan.  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 Lajoyce Corners regarding his WC bound status and disposition issues on 03/30/23. Fitting for prostheses not ideal as chance of being ambulatory is minimal and he is likely expected to be permanently wheelchair bound.   Anterior maxillary abscess, nonrestorable dentition Gum swelling and pain S/p oral surgery with I&D of anterior maxillary abscess, surgical extraction of teeth 4,5,6,11,12,13,20,21,22,27,28,29. Simple extraction teeth #7,8,9,10,23,24,25,26, Alveoloplasty x 4 quadrants on 03/25/23 Dr [name].   Completed Augmentin x 7 days Continue chlorhexidine rinse x 10 days 3 times daily Tolerating regular diet.  Acute on chronic systolic and diastolic heart failure Essential hypertension Echocardiogram 08/2022 showed left ventricular ejection fraction of  less than 20% with global hypokinesis. Currently blood pressure and volume status controlled on Coreg, Lasix, Imdur, hydralazine.   Patient has been counseled on dietary restrictions and recommendations  Type 2 diabetes mellitus Continue with sliding scale insulin at this time  History of DVT Patient is on Eliquis for anticoagulation.   COPD Continued with nebulizers, bronchodilators as needed   Chronic pain  MS Contin 30 mg BID and oxycodone 20 milligram every 4 hourly.  Will need direct supervision while taking pills since there was some concerns for hoarding the pills.   Schizoaffective disorder Mood is stable, seen by psychiatry during hospitalization.  Continue Abilify and Lexapro.     Oral thrush  received nystatin swish and swallow.    Constipation cont MiraLAX , Dulcolax PRN.      Polysubstance abuse (alcohol/cocaine/tobacco) No withdrawal symptoms. Cessation counseled.    Mobility: On wheelchair  Goals of care   Code Status: Full Code     DVT prophylaxis:  enoxaparin (LOVENOX) injection 40 mg Start: 02/25/23 2200 SCD's Start: 12/10/22 1731   Antimicrobials: None currently Fluid: None  Status: Inpatient Level of care:  Med-Surg   Patient from: Homeless Anticipated d/c to: Medically stable but difficult disposition.  Case management involved  Diet:  Diet Order             Diet regular Fluid consistency: Thin  Diet effective now                   Scheduled Meds:  amLODipine  10 mg Oral Daily   ARIPiprazole  5 mg Oral Daily   vitamin C  1,000 mg Oral Daily   carvedilol  3.125 mg Oral BID WC   chlorhexidine  15 mL Mouth/Throat TID   collagenase   Topical Daily   enoxaparin (LOVENOX) injection  40 mg Subcutaneous Q24H  escitalopram  5 mg Oral Daily   furosemide  40 mg Oral Daily   gabapentin  900 mg Oral TID   isosorbide mononitrate  15 mg Oral Daily   melatonin  3 mg Oral QHS   mometasone-formoterol  2 puff Inhalation BID   morphine  30  mg Oral Q12H   multivitamin with minerals  1 tablet Oral Daily   nystatin  5 mL Oral QID   pantoprazole  40 mg Oral Daily   thiamine  100 mg Oral Daily    PRN meds: alum & mag hydroxide-simeth, benzocaine, bisacodyl, diphenhydrAMINE, guaiFENesin-dextromethorphan, hydrALAZINE, hydrOXYzine, labetalol, levalbuterol, lidocaine, magnesium citrate, magnesium sulfate bolus IVPB, ondansetron, mouth rinse, oxyCODONE, phenol, polyethylene glycol, traMADol   Infusions:   magnesium sulfate bolus IVPB      Antimicrobials: Anti-infectives (From admission, onward)    Start     Dose/Rate Route Frequency Ordered Stop   03/19/23 0900  amoxicillin-clavulanate (AUGMENTIN) 875-125 MG per tablet 1 tablet        1 tablet Oral Every 12 hours 03/19/23 0809 04/01/23 2118   03/18/23 2000  Ampicillin-Sulbactam (UNASYN) 3 g in sodium chloride 0.9 % 100 mL IVPB  Status:  Discontinued        3 g 200 mL/hr over 30 Minutes Intravenous Every 6 hours 03/18/23 1939 03/19/23 0809   03/18/23 1645  Ampicillin-Sulbactam (UNASYN) 3 g in sodium chloride 0.9 % 100 mL IVPB  Status:  Discontinued        3 g 200 mL/hr over 30 Minutes Intravenous Every 6 hours 03/18/23 1555 03/18/23 1939   12/26/22 2200  doxycycline (VIBRA-TABS) tablet 100 mg        100 mg Oral Every 12 hours 12/26/22 1307 01/08/23 2126   12/24/22 1000  doxycycline (VIBRA-TABS) tablet 100 mg  Status:  Discontinued        100 mg Oral Every 12 hours 12/24/22 0715 12/26/22 1307   12/10/22 1830  ceFAZolin (ANCEF) IVPB 2g/100 mL premix  Status:  Discontinued        2 g 200 mL/hr over 30 Minutes Intravenous Every 8 hours 12/10/22 1731 12/10/22 1740   12/10/22 1330  ceFAZolin (ANCEF) IVPB 2g/100 mL premix        2 g 200 mL/hr over 30 Minutes Intravenous On call to O.R. 12/10/22 0959 12/10/22 1534   12/08/22 0530  vancomycin (VANCOREADY) IVPB 1500 mg/300 mL        1,500 mg 150 mL/hr over 120 Minutes Intravenous Every 12 hours 12/07/22 1741 12/11/22 0955   12/08/22  0345  metroNIDAZOLE (FLAGYL) IVPB 500 mg        500 mg 100 mL/hr over 60 Minutes Intravenous Every 12 hours 12/07/22 1742 12/12/22 0344   12/07/22 2330  ceFEPIme (MAXIPIME) 2 g in sodium chloride 0.9 % 100 mL IVPB        2 g 200 mL/hr over 30 Minutes Intravenous Every 8 hours 12/07/22 1741 12/11/22 1529   12/07/22 2200  metroNIDAZOLE (FLAGYL) IVPB 500 mg  Status:  Discontinued        500 mg 100 mL/hr over 60 Minutes Intravenous Every 12 hours 12/07/22 2048 12/07/22 2056   12/07/22 1530  ceFEPIme (MAXIPIME) 2 g in sodium chloride 0.9 % 100 mL IVPB        2 g 200 mL/hr over 30 Minutes Intravenous  Once 12/07/22 1516 12/07/22 1734   12/07/22 1530  vancomycin (VANCOREADY) IVPB 1500 mg/300 mL        1,500 mg 150 mL/hr  over 120 Minutes Intravenous  Once 12/07/22 1516 12/09/22 0333   12/07/22 1515  metroNIDAZOLE (FLAGYL) IVPB 500 mg        500 mg 100 mL/hr over 60 Minutes Intravenous  Once 12/07/22 1506 12/07/22 1734   12/07/22 1500  ceFAZolin (ANCEF) IVPB 1 g/50 mL premix  Status:  Discontinued        1 g 100 mL/hr over 30 Minutes Intravenous  Once 12/07/22 1452 12/07/22 1513       Nutritional status:  Body mass index is 29.56 kg/m.          Objective: Vitals:   04/09/23 1932 04/10/23 1025  BP: 112/73 125/85  Pulse: 74 82  Resp: 19 16  Temp: (!) 97.4 F (36.3 C) 98.2 F (36.8 C)  SpO2: 98% 98%    Intake/Output Summary (Last 24 hours) at 04/10/2023 1143 Last data filed at 04/09/2023 1300 Gross per 24 hour  Intake 360 ml  Output --  Net 360 ml    Filed Weights   03/28/23 0408 03/29/23 0742 03/30/23 0758  Weight: 90.8 kg 90.6 kg 90.8 kg   Weight change:  Body mass index is 29.56 kg/m.   Physical Exam: General exam: Middle-aged African-American male.  Not in distress Skin: No rashes, lesions or ulcers. HEENT: Atraumatic, normocephalic, no obvious bleeding Lungs: Clear to auscultation bilaterally CVS: Regular rate and rhythm no murmur GI/Abd soft, nontender,  nondistended, bowel sound present CNS: Alert, awake, oriented x 3 Psychiatry: More awake Extremities: Wheelchair-bound status.  Left BKA, prior right AKA  Data Review: I have personally reviewed the laboratory data and studies available.  F/u labs  Unresulted Labs (From admission, onward)     Start     Ordered   03/04/23 0500  Creatinine, serum  (enoxaparin (LOVENOX)    CrCl >/= 30 ml/min)  Weekly,   R     Comments: while on enoxaparin therapy   Question:  Specimen collection method  Answer:  Lab=Lab collect   02/25/23 1637            Total time spent in review of labs and imaging, patient evaluation, formulation of plan, documentation and communication with family: 25 minutes  Signed, Lorin Glass, MD [facility] 04/10/2023

## 2023-04-11 DIAGNOSIS — I96 Gangrene, not elsewhere classified: Secondary | ICD-10-CM | POA: Diagnosis not present

## 2023-04-11 NOTE — Plan of Care (Signed)
  Problem: Elimination: Goal: Will not experience complications related to bowel motility Outcome: Progressing Goal: Will not experience complications related to urinary retention Outcome: Progressing   Problem: Nutrition: Goal: Adequate nutrition will be maintained Outcome: Progressing   

## 2023-04-11 NOTE — Progress Notes (Signed)
PROGRESS NOTE  [name]  DOB: 09/05/1966  PCP: [name], NP ZOX:[id number]  DOA: 12/07/2022  LOS: 125 days  Hospital Day: 126  Brief narrative: [name] is a 57 y.o. male with PMH significant for homelessness, DM2, multivitamin, HLD, CAD, CHF, COPD, depression, bipolar disorder, schizoaffective disorder, polyneuropathy 12/07/2022, patient presented with left lower extremity chronic venous stasis with associated cellulitis/gangrene with associated sepsis. He was admitted and managed empirically on Vancomycin,Flagyl and Cefepime  1/10, required left BKA   Hospitalization complicated by difficulty disposition. After several weeks of hospital stay, he was also found to have maxillary abscess and periapical abscess. 4/24, Patient underwent oral surgery with I&D of anterior maxillary abscess, surgical extraction of multiple teeth and alveoloplasty.  04/11/2023: Patient seen.  Above documentation noted.  Patient is awaiting disposition.  Subjective: No new complaints.  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 Lajoyce Corners regarding his WC bound status and disposition issues on 03/30/23. Fitting for prostheses not ideal as chance of being ambulatory is minimal and he is likely expected to be permanently wheelchair bound.   Anterior maxillary abscess, nonrestorable dentition Gum swelling and pain S/p oral surgery with I&D of anterior maxillary abscess, surgical extraction of teeth 4,5,6,11,12,13,20,21,22,27,28,29. Simple extraction teeth #7,8,9,10,23,24,25,26, Alveoloplasty x 4 quadrants on 03/25/23 Dr [name].   Completed Augmentin x 7 days Continue chlorhexidine rinse x 10 days 3 times daily Tolerating regular diet.  Acute on chronic systolic and diastolic heart failure Essential hypertension Echocardiogram 08/2022 showed left ventricular ejection fraction of less than 20% with  global hypokinesis. Currently blood pressure and volume status controlled on Coreg, Lasix, Imdur, hydralazine.   Patient has been counseled on dietary restrictions and recommendations  Type 2 diabetes mellitus Continue with sliding scale insulin at this time  History of DVT Patient is on Eliquis for anticoagulation.   COPD Continued with nebulizers, bronchodilators as needed   Chronic pain  MS Contin 30 mg BID and oxycodone 20 milligram every 4 hourly.  Will need direct supervision while taking pills since there was some concerns for hoarding the pills.   Schizoaffective disorder Mood is stable, seen by psychiatry during hospitalization.  Continue Abilify and Lexapro.     Oral thrush  received nystatin swish and swallow.    Constipation cont MiraLAX , Dulcolax PRN.      Polysubstance abuse (alcohol/cocaine/tobacco) No withdrawal symptoms. Cessation counseled.    Mobility: On wheelchair  Goals of care   Code Status: Full Code     DVT prophylaxis:  enoxaparin (LOVENOX) injection 40 mg Start: 02/25/23 2200 SCD's Start: 12/10/22 1731   Antimicrobials: None currently Fluid: None  Status: Inpatient Level of care:  Med-Surg   Patient from: Homeless Anticipated d/c to: Medically stable but difficult disposition.  Case management involved  Diet:  Diet Order             Diet regular Fluid consistency: Thin  Diet effective now                   Scheduled Meds:  amLODipine  10 mg Oral Daily   ARIPiprazole  5 mg Oral Daily   vitamin C  1,000 mg Oral Daily   carvedilol  3.125 mg Oral BID WC   chlorhexidine  15 mL Mouth/Throat TID   collagenase   Topical Daily   enoxaparin (LOVENOX) injection  40 mg Subcutaneous Q24H   escitalopram  5  mg Oral Daily   furosemide  40 mg Oral Daily   gabapentin  900 mg Oral TID   isosorbide mononitrate  15 mg Oral Daily   melatonin  3 mg Oral QHS   mometasone-formoterol  2 puff Inhalation BID   morphine  30 mg Oral Q12H    multivitamin with minerals  1 tablet Oral Daily   nystatin  5 mL Oral QID   pantoprazole  40 mg Oral Daily   thiamine  100 mg Oral Daily    PRN meds: alum & mag hydroxide-simeth, benzocaine, bisacodyl, diphenhydrAMINE, guaiFENesin-dextromethorphan, hydrALAZINE, hydrOXYzine, labetalol, levalbuterol, lidocaine, magnesium citrate, magnesium sulfate bolus IVPB, ondansetron, mouth rinse, oxyCODONE, phenol, polyethylene glycol, traMADol   Infusions:   magnesium sulfate bolus IVPB      Antimicrobials: Anti-infectives (From admission, onward)    Start     Dose/Rate Route Frequency Ordered Stop   03/19/23 0900  amoxicillin-clavulanate (AUGMENTIN) 875-125 MG per tablet 1 tablet        1 tablet Oral Every 12 hours 03/19/23 0809 04/01/23 2118   03/18/23 2000  Ampicillin-Sulbactam (UNASYN) 3 g in sodium chloride 0.9 % 100 mL IVPB  Status:  Discontinued        3 g 200 mL/hr over 30 Minutes Intravenous Every 6 hours 03/18/23 1939 03/19/23 0809   03/18/23 1645  Ampicillin-Sulbactam (UNASYN) 3 g in sodium chloride 0.9 % 100 mL IVPB  Status:  Discontinued        3 g 200 mL/hr over 30 Minutes Intravenous Every 6 hours 03/18/23 1555 03/18/23 1939   12/26/22 2200  doxycycline (VIBRA-TABS) tablet 100 mg        100 mg Oral Every 12 hours 12/26/22 1307 01/08/23 2126   12/24/22 1000  doxycycline (VIBRA-TABS) tablet 100 mg  Status:  Discontinued        100 mg Oral Every 12 hours 12/24/22 0715 12/26/22 1307   12/10/22 1830  ceFAZolin (ANCEF) IVPB 2g/100 mL premix  Status:  Discontinued        2 g 200 mL/hr over 30 Minutes Intravenous Every 8 hours 12/10/22 1731 12/10/22 1740   12/10/22 1330  ceFAZolin (ANCEF) IVPB 2g/100 mL premix        2 g 200 mL/hr over 30 Minutes Intravenous On call to O.R. 12/10/22 0959 12/10/22 1534   12/08/22 0530  vancomycin (VANCOREADY) IVPB 1500 mg/300 mL        1,500 mg 150 mL/hr over 120 Minutes Intravenous Every 12 hours 12/07/22 1741 12/11/22 0955   12/08/22 0345   metroNIDAZOLE (FLAGYL) IVPB 500 mg        500 mg 100 mL/hr over 60 Minutes Intravenous Every 12 hours 12/07/22 1742 12/12/22 0344   12/07/22 2330  ceFEPIme (MAXIPIME) 2 g in sodium chloride 0.9 % 100 mL IVPB        2 g 200 mL/hr over 30 Minutes Intravenous Every 8 hours 12/07/22 1741 12/11/22 1529   12/07/22 2200  metroNIDAZOLE (FLAGYL) IVPB 500 mg  Status:  Discontinued        500 mg 100 mL/hr over 60 Minutes Intravenous Every 12 hours 12/07/22 2048 12/07/22 2056   12/07/22 1530  ceFEPIme (MAXIPIME) 2 g in sodium chloride 0.9 % 100 mL IVPB        2 g 200 mL/hr over 30 Minutes Intravenous  Once 12/07/22 1516 12/07/22 1734   12/07/22 1530  vancomycin (VANCOREADY) IVPB 1500 mg/300 mL        1,500 mg 150 mL/hr over 120 Minutes  Intravenous  Once 12/07/22 1516 12/09/22 0333   12/07/22 1515  metroNIDAZOLE (FLAGYL) IVPB 500 mg        500 mg 100 mL/hr over 60 Minutes Intravenous  Once 12/07/22 1506 12/07/22 1734   12/07/22 1500  ceFAZolin (ANCEF) IVPB 1 g/50 mL premix  Status:  Discontinued        1 g 100 mL/hr over 30 Minutes Intravenous  Once 12/07/22 1452 12/07/22 1513       Nutritional status:  Body mass index is 29.56 kg/m.          Objective: Vitals:   04/10/23 2111 04/11/23 0749  BP: (!) 150/91 (!) 132/98  Pulse: 70 69  Resp: 18 18  Temp: 98.2 F (36.8 C) 97.7 F (36.5 C)  SpO2: 98% 99%    Intake/Output Summary (Last 24 hours) at 04/11/2023 1353 Last data filed at 04/11/2023 0800 Gross per 24 hour  Intake 480 ml  Output 400 ml  Net 80 ml    Filed Weights   03/28/23 0408 03/29/23 0742 03/30/23 0758  Weight: 90.8 kg 90.6 kg 90.8 kg   Weight change:  Body mass index is 29.56 kg/m.   Physical Exam: General exam: Middle-aged African-American male.  Not in distress Skin: No rashes, lesions or ulcers. HEENT: Atraumatic, normocephalic, no obvious bleeding Lungs: Clear to auscultation bilaterally CVS: Regular rate and rhythm no murmur GI/Abd soft, nontender,  nondistended, bowel sound present CNS: Alert, awake, oriented x 3 Psychiatry: More awake Extremities: Wheelchair-bound status.  Left BKA, prior right AKA  Data Review: I have personally reviewed the laboratory data and studies available.  F/u labs  Unresulted Labs (From admission, onward)     Start     Ordered   03/04/23 0500  Creatinine, serum  (enoxaparin (LOVENOX)    CrCl >/= 30 ml/min)  Weekly,   R     Comments: while on enoxaparin therapy   Question:  Specimen collection method  Answer:  Lab=Lab collect   02/25/23 1637            Total time spent in review of labs and imaging, patient evaluation, formulation of plan, documentation and communication with family: 25 minutes  Signed, Barnetta Chapel, MD [facility] 04/11/2023

## 2023-04-12 DIAGNOSIS — I96 Gangrene, not elsewhere classified: Secondary | ICD-10-CM | POA: Diagnosis not present

## 2023-04-12 NOTE — Progress Notes (Signed)
PROGRESS NOTE  [name]  DOB: Apr 19, 1966  PCP: [name], NP XBJ:[id number]  DOA: 12/07/2022  LOS: 126 days  Hospital Day: 127  Brief narrative: [name] is a 57 y.o. male with PMH significant for homelessness, DM2, multivitamin, HLD, CAD, CHF, COPD, depression, bipolar disorder, schizoaffective disorder, polyneuropathy 12/07/2022, patient presented with left lower extremity chronic venous stasis with associated cellulitis/gangrene with associated sepsis. He was admitted and managed empirically on Vancomycin,Flagyl and Cefepime  1/10, required left BKA   Hospitalization complicated by difficulty disposition. After several weeks of hospital stay, he was also found to have maxillary abscess and periapical abscess. 4/24, Patient underwent oral surgery with I&D of anterior maxillary abscess, surgical extraction of multiple teeth and alveoloplasty.  04/12/2023: Patient seen.  Above documentation noted.  Patient is awaiting disposition.  Subjective: No new complaints.  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 Lajoyce Corners regarding his WC bound status and disposition issues on 03/30/23. Fitting for prostheses not ideal as chance of being ambulatory is minimal and he is likely expected to be permanently wheelchair bound.   Anterior maxillary abscess, nonrestorable dentition Gum swelling and pain S/p oral surgery with I&D of anterior maxillary abscess, surgical extraction of teeth 4,5,6,11,12,13,20,21,22,27,28,29. Simple extraction teeth #7,8,9,10,23,24,25,26, Alveoloplasty x 4 quadrants on 03/25/23 Dr [name].   Completed Augmentin x 7 days Continue chlorhexidine rinse x 10 days 3 times daily Tolerating regular diet.  Acute on chronic systolic and diastolic heart failure Essential hypertension Echocardiogram 08/2022 showed left ventricular ejection fraction of less than 20% with  global hypokinesis. Currently blood pressure and volume status controlled on Coreg, Lasix, Imdur, hydralazine.   Patient has been counseled on dietary restrictions and recommendations  Type 2 diabetes mellitus Continue with sliding scale insulin at this time  History of DVT Patient is on Eliquis for anticoagulation.   COPD Continued with nebulizers, bronchodilators as needed   Chronic pain  MS Contin 30 mg BID and oxycodone 20 milligram every 4 hourly.  Will need direct supervision while taking pills since there was some concerns for hoarding the pills.   Schizoaffective disorder Mood is stable, seen by psychiatry during hospitalization.  Continue Abilify and Lexapro.     Oral thrush  received nystatin swish and swallow.    Constipation cont MiraLAX , Dulcolax PRN.      Polysubstance abuse (alcohol/cocaine/tobacco) No withdrawal symptoms. Cessation counseled.    Mobility: On wheelchair  Goals of care   Code Status: Full Code     DVT prophylaxis:  enoxaparin (LOVENOX) injection 40 mg Start: 02/25/23 2200 SCD's Start: 12/10/22 1731   Antimicrobials: None currently Fluid: None  Status: Inpatient Level of care:  Med-Surg   Patient from: Homeless Anticipated d/c to: Medically stable but difficult disposition.  Case management involved  Diet:  Diet Order             Diet regular Fluid consistency: Thin  Diet effective now                   Scheduled Meds:  amLODipine  10 mg Oral Daily   ARIPiprazole  5 mg Oral Daily   vitamin C  1,000 mg Oral Daily   carvedilol  3.125 mg Oral BID WC   chlorhexidine  15 mL Mouth/Throat TID   collagenase   Topical Daily   enoxaparin (LOVENOX) injection  40 mg Subcutaneous Q24H   escitalopram  5  mg Oral Daily   furosemide  40 mg Oral Daily   gabapentin  900 mg Oral TID   isosorbide mononitrate  15 mg Oral Daily   melatonin  3 mg Oral QHS   mometasone-formoterol  2 puff Inhalation BID   morphine  30 mg Oral Q12H    multivitamin with minerals  1 tablet Oral Daily   nystatin  5 mL Oral QID   pantoprazole  40 mg Oral Daily   thiamine  100 mg Oral Daily    PRN meds: alum & mag hydroxide-simeth, benzocaine, bisacodyl, diphenhydrAMINE, guaiFENesin-dextromethorphan, hydrALAZINE, hydrOXYzine, labetalol, levalbuterol, lidocaine, magnesium citrate, magnesium sulfate bolus IVPB, ondansetron, mouth rinse, oxyCODONE, phenol, polyethylene glycol, traMADol   Infusions:   magnesium sulfate bolus IVPB      Antimicrobials: Anti-infectives (From admission, onward)    Start     Dose/Rate Route Frequency Ordered Stop   03/19/23 0900  amoxicillin-clavulanate (AUGMENTIN) 875-125 MG per tablet 1 tablet        1 tablet Oral Every 12 hours 03/19/23 0809 04/01/23 2118   03/18/23 2000  Ampicillin-Sulbactam (UNASYN) 3 g in sodium chloride 0.9 % 100 mL IVPB  Status:  Discontinued        3 g 200 mL/hr over 30 Minutes Intravenous Every 6 hours 03/18/23 1939 03/19/23 0809   03/18/23 1645  Ampicillin-Sulbactam (UNASYN) 3 g in sodium chloride 0.9 % 100 mL IVPB  Status:  Discontinued        3 g 200 mL/hr over 30 Minutes Intravenous Every 6 hours 03/18/23 1555 03/18/23 1939   12/26/22 2200  doxycycline (VIBRA-TABS) tablet 100 mg        100 mg Oral Every 12 hours 12/26/22 1307 01/08/23 2126   12/24/22 1000  doxycycline (VIBRA-TABS) tablet 100 mg  Status:  Discontinued        100 mg Oral Every 12 hours 12/24/22 0715 12/26/22 1307   12/10/22 1830  ceFAZolin (ANCEF) IVPB 2g/100 mL premix  Status:  Discontinued        2 g 200 mL/hr over 30 Minutes Intravenous Every 8 hours 12/10/22 1731 12/10/22 1740   12/10/22 1330  ceFAZolin (ANCEF) IVPB 2g/100 mL premix        2 g 200 mL/hr over 30 Minutes Intravenous On call to O.R. 12/10/22 0959 12/10/22 1534   12/08/22 0530  vancomycin (VANCOREADY) IVPB 1500 mg/300 mL        1,500 mg 150 mL/hr over 120 Minutes Intravenous Every 12 hours 12/07/22 1741 12/11/22 0955   12/08/22 0345   metroNIDAZOLE (FLAGYL) IVPB 500 mg        500 mg 100 mL/hr over 60 Minutes Intravenous Every 12 hours 12/07/22 1742 12/12/22 0344   12/07/22 2330  ceFEPIme (MAXIPIME) 2 g in sodium chloride 0.9 % 100 mL IVPB        2 g 200 mL/hr over 30 Minutes Intravenous Every 8 hours 12/07/22 1741 12/11/22 1529   12/07/22 2200  metroNIDAZOLE (FLAGYL) IVPB 500 mg  Status:  Discontinued        500 mg 100 mL/hr over 60 Minutes Intravenous Every 12 hours 12/07/22 2048 12/07/22 2056   12/07/22 1530  ceFEPIme (MAXIPIME) 2 g in sodium chloride 0.9 % 100 mL IVPB        2 g 200 mL/hr over 30 Minutes Intravenous  Once 12/07/22 1516 12/07/22 1734   12/07/22 1530  vancomycin (VANCOREADY) IVPB 1500 mg/300 mL        1,500 mg 150 mL/hr over 120 Minutes  Intravenous  Once 12/07/22 1516 12/09/22 0333   12/07/22 1515  metroNIDAZOLE (FLAGYL) IVPB 500 mg        500 mg 100 mL/hr over 60 Minutes Intravenous  Once 12/07/22 1506 12/07/22 1734   12/07/22 1500  ceFAZolin (ANCEF) IVPB 1 g/50 mL premix  Status:  Discontinued        1 g 100 mL/hr over 30 Minutes Intravenous  Once 12/07/22 1452 12/07/22 1513       Nutritional status:  Body mass index is 29.56 kg/m.          Objective: Vitals:   04/12/23 0915 04/12/23 1300  BP: (!) 159/111 122/78  Pulse: (!) 44 71  Resp:  18  Temp: 98.5 F (36.9 C) 97.6 F (36.4 C)  SpO2: 95% 95%    Intake/Output Summary (Last 24 hours) at 04/12/2023 1749 Last data filed at 04/12/2023 1305 Gross per 24 hour  Intake 970 ml  Output 200 ml  Net 770 ml    Filed Weights   03/28/23 0408 03/29/23 0742 03/30/23 0758  Weight: 90.8 kg 90.6 kg 90.8 kg   Weight change:  Body mass index is 29.56 kg/m.   Physical Exam: General exam: Middle-aged African-American male.  Not in distress Skin: No rashes, lesions or ulcers. HEENT: Atraumatic, normocephalic, no obvious bleeding Lungs: Clear to auscultation bilaterally CVS: Regular rate and rhythm no murmur GI/Abd soft, nontender,  nondistended, bowel sound present CNS: Alert, awake, oriented x 3 Psychiatry: More awake Extremities: Wheelchair-bound status.  Left BKA, prior right AKA  Data Review: I have personally reviewed the laboratory data and studies available.  F/u labs  Unresulted Labs (From admission, onward)     Start     Ordered   03/04/23 0500  Creatinine, serum  (enoxaparin (LOVENOX)    CrCl >/= 30 ml/min)  Weekly,   R     Comments: while on enoxaparin therapy   Question:  Specimen collection method  Answer:  Lab=Lab collect   02/25/23 1637            Total time spent in review of labs and imaging, patient evaluation, formulation of plan, documentation and communication with family: 25 minutes  Signed, Barnetta Chapel, MD [facility] 04/12/2023

## 2023-04-13 DIAGNOSIS — Z5900 Homelessness unspecified: Secondary | ICD-10-CM | POA: Diagnosis not present

## 2023-04-13 DIAGNOSIS — I96 Gangrene, not elsewhere classified: Secondary | ICD-10-CM | POA: Diagnosis not present

## 2023-04-13 DIAGNOSIS — L03116 Cellulitis of left lower limb: Secondary | ICD-10-CM | POA: Diagnosis not present

## 2023-04-13 DIAGNOSIS — L0390 Cellulitis, unspecified: Secondary | ICD-10-CM | POA: Diagnosis not present

## 2023-04-13 DIAGNOSIS — R9431 Abnormal electrocardiogram [ECG] [EKG]: Secondary | ICD-10-CM

## 2023-04-13 NOTE — Progress Notes (Signed)
PROGRESS NOTE    [name]  WNU:[id number] DOB: 03-02-1966 DOA: 12/07/2022 PCP: [name], NP   Brief Narrative: [name] is a 57 y.o. male with a history of heart failure, CAD, right BKA, COPD, hyperlipidemia, hyperlipidemia, polyneuropathy, schizoaffective/bipolar disorder,, was admitted to the hospital with left lower extremity cellulitis and gangrene.  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    During hospitalization, patient was also noted to have maxillary abscess and periapical abscess and underwent oral surgery with I&D and of anterior maxillary abscess and surgical extraction of multiple teeth in alveoloplasty.Currently stable.  Hospital stay prolonged due to difficulty in placement due to homelessness status status post bilateral BKA.Marland Kitchen  Assessment and Plan:  Sepsis secondary to cellulitis/gangrene of left foot Initially received vancomycin, Cefepime and Flagyl.  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  Previous history of right below-knee amputation.  Currently unsafe for disposition.  Patient is wheelchair-bound.   Anterior maxillary abscess Status post oral surgery with I&D of anterior maxillary abscess, surgical extraction of teeth 4,5,6,11,12,13,20,21,22,27,28,29. Simple extraction teeth #7,8,9,10,23,24,25,26, Alveoloplasty x 4 quadrants on 03/25/23 Dr [name].  Completed Augmentin x 7 days Continue chlorhexidine rinse x 10 days 3 times daily Tolerating regular diet.  Chronic combined systolic and diastolic heart failure Compensated.  Continue Lasix, Coreg, hydralazine, Imdur.  Currently on Imdur.  Reviewed recent 2D echocardiogram from 08/2022 with LV ejection fraction of 40% with global hypokinesis.  Blood pressure seems to be stable.  Essential hypertension -Continue Coreg, amlodipine, Imdur.  Will continue to monitor.  COPD Compensated. Continue Dulera BID and albuterol.     Anxiety -Continue Atarax PRN  GERD -Continue Protonix  Constipation -Continue bowel regimen with MiraLAX , Dulcolax PRN.  On narcotics  Chronic pain On MS Contin 30 mg BID and oxycodone 20 milligram every 4 hourly.  To be supervised.  Schizoaffective disorder Seen by psychiatry during hospitalization.  Continue Abilify and Lexapro.    DVT prophylaxis: Lovenox subcu  Code Status:   Code Status: Full Code  Family Communication:  None at bedside.  Disposition Plan:  Medically stable for discharge.  Difficult disposition.   Consultants:  Orthopedic surgery Psychiatry  Procedures:  12/10/22: Left BKA Status post oral surgery with I&D of anterior maxillary abscess, surgical extraction of teeth 4,5,6,11,12,13,20,21,22,27,28,29. Simple extraction teeth #7,8,9,10,23,24,25,26, Alveoloplasty x 4 quadrants on 03/25/23 Dr [name].  Antimicrobials: None currently.  Subjective: Today, patient was seen and examined at bedside.  Patient denies any nausea vomiting fever chills or rigor.  Patient requests nebulizer instead of inhalers.  Objective: BP (!) 151/89 (BP Location: Left Arm)   Pulse 63   Temp 98.4 F (36.9 C) (Oral)   Resp 18   Ht 5\' 9"  (1.753 m)   Wt 90.8 kg   SpO2 99%   BMI 29.56 kg/m   Examination: Body mass index is 29.56 kg/m.   General:  Average built, not in obvious distress HENT: Oral cavity with dental extraction.  No scleral pallor or icterus noted. Oral mucosa is moist.  Chest:   Diminished breath sounds bilaterally. No crackles or wheezes.  CVS: S1 &S2 heard. No murmur.  Regular rate and rhythm. Abdomen: Soft, nontender, nondistended.  Bowel sounds are heard.   Extremities:   Bilateral below-knee amputation. Psych: Alert, awake and oriented, normal mood CNS:  No cranial nerve deficits.  Moves upper extremities. Skin: Warm and dry.  Bilateral below-knee amputation.  Data Reviewed: I have personally reviewed the  following labs and imaging studies.     CBC Lab Results  Component Value Date   WBC 5.9 03/20/2023   RBC 4.94 03/20/2023   HGB 14.8 03/20/2023   HCT 43.6 03/20/2023   MCV 88.3 03/20/2023   MCH 30.0 03/20/2023   PLT 208 03/20/2023   MCHC 33.9 03/20/2023   RDW 14.8 03/20/2023   LYMPHSABS 2.1 12/27/2022   MONOABS 0.7 12/27/2022   EOSABS 0.1 12/27/2022   BASOSABS 0.1 12/27/2022     Last metabolic panel Lab Results  Component Value Date   NA 139 03/18/2023   K 3.8 03/18/2023   CL 106 03/18/2023   CO2 23 03/18/2023   BUN 15 03/18/2023   CREATININE 0.86 04/01/2023   GLUCOSE 163 (H) 03/18/2023   GFRNONAA >60 04/01/2023   GFRAA >60 08/21/2020   CALCIUM 9.3 03/18/2023   PHOS 4.7 (H) 07/18/2022   PROT 7.4 03/04/2023   ALBUMIN 3.5 03/04/2023   BILITOT 0.4 03/04/2023   ALKPHOS 62 03/04/2023   AST 17 03/04/2023   ALT 16 03/04/2023   ANIONGAP 10 03/18/2023    GFR: Estimated Creatinine Clearance: 109.3 mL/min (by C-G formula based on SCr of 0.86 mg/dL).  No results found for this or any previous visit (from the past 240 hour(s)).    Radiology Studies: No results found.    LOS: 127 days    Joycelyn Das, MD [facility] 04/13/2023, 8:52 AM If 7PM-7AM, please contact night-coverage [email or web address]

## 2023-04-13 NOTE — Progress Notes (Signed)
Mobility Specialist Progress Note   04/13/23 1535  Mobility  Activity Ambulated independently in hallway (UE exercise with resistance bands)  Level of Assistance Standby assist, set-up cues, supervision of patient - no hands on  Assistive Device Wheelchair  Distance Ambulated (ft) 1100 ft (550 ft = 4 minutes & 15 seconds) (propelled)  Range of Motion/Exercises Active  RLE Weight Bearing NWB  LLE Weight Bearing NWB  Activity Response Tolerated well   Patient received in room, eager to participate. Completed x4 interval UE exercises with resistance bands and propelled in hallway between sets with supervision. Returned to room without complaint or incident. Was left in room with all needs met, call bell in reach.   [place] Shelah Heatley, BS EXP Mobility Specialist Please contact via SecureChat or Rehab office at [phone]

## 2023-04-13 NOTE — Plan of Care (Signed)
  Problem: Pain Managment: Goal: General experience of comfort will improve Outcome: Progressing   Problem: Safety: Goal: Ability to remain free from injury will improve Outcome: Progressing   Problem: Pain Managment: Goal: General experience of comfort will improve Outcome: Progressing   

## 2023-04-14 DIAGNOSIS — L03116 Cellulitis of left lower limb: Secondary | ICD-10-CM | POA: Diagnosis not present

## 2023-04-14 DIAGNOSIS — L0390 Cellulitis, unspecified: Secondary | ICD-10-CM | POA: Diagnosis not present

## 2023-04-14 DIAGNOSIS — Z5900 Homelessness unspecified: Secondary | ICD-10-CM | POA: Diagnosis not present

## 2023-04-14 DIAGNOSIS — I96 Gangrene, not elsewhere classified: Secondary | ICD-10-CM | POA: Diagnosis not present

## 2023-04-14 NOTE — Plan of Care (Signed)
  Problem: Education: Goal: Knowledge of General Education information will improve Description: Including pain rating scale, medication(s)/side effects and non-pharmacologic comfort measures Outcome: Adequate for Discharge   Problem: Clinical Measurements: Goal: Ability to maintain clinical measurements within normal limits will improve Outcome: Adequate for Discharge Goal: Will remain free from infection Outcome: Adequate for Discharge   

## 2023-04-14 NOTE — Plan of Care (Signed)
  Problem: Coping: Goal: Level of anxiety will decrease Outcome: Progressing   Problem: Pain Managment: Goal: General experience of comfort will improve Outcome: Progressing   

## 2023-04-14 NOTE — Progress Notes (Signed)
PROGRESS NOTE    [name]  ZOX:[id number] DOB: 1966-03-08 DOA: 12/07/2022 PCP: [name], NP   Brief Narrative: [name] is a 57 y.o. male with a history of heart failure, CAD, right BKA, COPD, hyperlipidemia, hyperlipidemia, polyneuropathy, schizoaffective/bipolar disorder,, was admitted to the hospital with left lower extremity cellulitis and gangrene.  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    During hospitalization, patient was also noted to have maxillary abscess and periapical abscess and underwent oral surgery with I&D and of anterior maxillary abscess and surgical extraction of multiple teeth in alveoloplasty. Currently stable.  Hospital stay prolonged due to difficulty in placement due to homelessness status status post bilateral BKA.  Assessment and Plan:  Sepsis secondary to cellulitis/gangrene of left foot Initially received vancomycin, Cefepime and Flagyl.  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  Previous history of right below-knee amputation.  Currently unsafe for disposition.  Patient is wheelchair-bound.   Anterior maxillary abscess Status post oral surgery with I&D of anterior maxillary abscess, surgical extraction of teeth 4,5,6,11,12,13,20,21,22,27,28,29. Simple extraction teeth #7,8,9,10,23,24,25,26, Alveoloplasty x 4 quadrants on 03/25/23 Dr [name].  Completed Augmentin x 7 days. Continue chlorhexidine rinse x 10 days 3 times daily. Tolerating regular diet.  Chronic combined systolic and diastolic heart failure Compensated.  Continue Lasix, Coreg, hydralazine, Imdur.  Currently on Imdur.  Reviewed recent 2D echocardiogram from 08/2022 with LV ejection fraction of 40% with global hypokinesis.  Blood pressure seems to be stable.  Essential hypertension Continue Coreg, amlodipine, Imdur.  Will continue to monitor.  COPD Compensated. Continue Dulera BID and albuterol.     Anxiety -Continue Atarax PRN  GERD -Continue Protonix  Constipation -Continue bowel regimen with MiraLAX , Dulcolax PRN.  On narcotics  Chronic pain On MS Contin 30 mg BID   To be supervised.  Schizoaffective disorder Seen by psychiatry during hospitalization.  Continue Abilify and Lexapro.    DVT prophylaxis: Lovenox subcu  Code Status:   Code Status: Full Code  Family Communication:  None at bedside.  Disposition Plan:  Medically stable for discharge.  Difficult disposition.   Consultants:  Orthopedic surgery Psychiatry  Procedures:  12/10/22: Left BKA Status post oral surgery with I&D of anterior maxillary abscess, surgical extraction of teeth 4,5,6,11,12,13,20,21,22,27,28,29. Simple extraction teeth #7,8,9,10,23,24,25,26, Alveoloplasty x 4 quadrants on 03/25/23 Dr [name].  Antimicrobials: None currently.  Subjective: Today, patient was seen and examined at bedside.  No nausea vomiting fever chills or rigor.   Objective: BP (!) 144/95 (BP Location: Left Arm)   Pulse 61   Temp 97.8 F (36.6 C) (Oral)   Resp 14   Ht 5\' 9"  (1.753 m)   Wt 90.8 kg   SpO2 100%   BMI 29.56 kg/m   Examination: Body mass index is 29.56 kg/m.   General:  Average built, not in obvious distress HENT: No scleral pallor or icterus noted. Oral mucosa is moist.  Status post dental extraction. Chest:   Diminished breath sounds bilaterally. No crackles or wheezes.  CVS: S1 &S2 heard. No murmur.  Regular rate and rhythm. Abdomen: Soft, nontender, nondistended.  Bowel sounds are heard.   Extremities:   Bilateral below-knee amputation. Psych: Alert, awake and oriented, normal mood CNS:  No cranial nerve deficits.  Moves upper extremities. Skin: Warm and dry.  Bilateral below-knee amputation.  Data Reviewed: I have personally reviewed the following labs and imaging studies.    CBC Lab Results  Component Value Date  WBC 5.9 03/20/2023   RBC 4.94 03/20/2023   HGB 14.8 03/20/2023    HCT 43.6 03/20/2023   MCV 88.3 03/20/2023   MCH 30.0 03/20/2023   PLT 208 03/20/2023   MCHC 33.9 03/20/2023   RDW 14.8 03/20/2023   LYMPHSABS 2.1 12/27/2022   MONOABS 0.7 12/27/2022   EOSABS 0.1 12/27/2022   BASOSABS 0.1 12/27/2022     Last metabolic panel Lab Results  Component Value Date   NA 139 03/18/2023   K 3.8 03/18/2023   CL 106 03/18/2023   CO2 23 03/18/2023   BUN 15 03/18/2023   CREATININE 0.86 04/01/2023   GLUCOSE 163 (H) 03/18/2023   GFRNONAA >60 04/01/2023   GFRAA >60 08/21/2020   CALCIUM 9.3 03/18/2023   PHOS 4.7 (H) 07/18/2022   PROT 7.4 03/04/2023   ALBUMIN 3.5 03/04/2023   BILITOT 0.4 03/04/2023   ALKPHOS 62 03/04/2023   AST 17 03/04/2023   ALT 16 03/04/2023   ANIONGAP 10 03/18/2023    GFR: Estimated Creatinine Clearance: 109.3 mL/min (by C-G formula based on SCr of 0.86 mg/dL).  No results found for this or any previous visit (from the past 240 hour(s)).    Radiology Studies: No results found.    LOS: 128 days    Joycelyn Das, MD [facility] 04/14/2023, 12:04 PM If 7PM-7AM, please contact night-coverage [email or web address]

## 2023-04-14 NOTE — Progress Notes (Signed)
Physical Therapy Treatment Patient Details Name: [name] MRN: [id number] DOB: Nov 30, 1966 Today's Date: 04/14/2023   History of Present Illness Pt is a 57 y/o M who presented 12/17/22 with L ankle cellulitis and underwent L BKA on 1/10. Oral abscesses noted, underwent oral surgery Wed, 4/24; PMH includes asthma, bioplar 1, depression, schizoaffective disorder, CVA, chronic CHF, homelessness, HTN, DM2, cocaine and cannabis use, and R knee disarticulation    PT Comments    Continuing work on functional mobility and activity tolerance;  Session focused on reviewing wc to mat table transfers, given new wheelchair; [name] used the same technqiue of backing onto mat table with his wc oriented sideways to table (instead of facing the mat table as is typically done with ant-post transfers); [name] also placed L hand on the middle of the wheelchair backing, and this PT noted a small tilt backwards that would have caused a lot of anxiety in earlier PT sessions, but Kaien tolerated it, and kept moving with his transfer; Once done with his transfers to/from mat table, this PT re-emphasized to [name] to keep from placing his hand on the middle of the wheelchair backing because ti will cause the backing to rip form where it is fixed to the wheelchair frame;   I believe [name] transfers ant-post in the more correct way with [name], [name] Specialist; I wonder if his choice for the "sideways" ant post transfer is situational - specific to the mat table and possibly specific to this PT;   It sounds like [name] is sore from his workout with [name] yesterday -- this PT praised and encourgaed [name], because it takes hard work to get stronger; [name] opted not to work on floor transfers due to soreness;  Will coordinate plans for sessions with Mobility   Recommendations for follow up therapy are one component of a multi-disciplinary discharge planning process, led by the attending physician.  Recommendations may be updated  based on patient status, additional functional criteria and insurance authorization.  Follow Up Recommendations  Can patient physically be transported by private vehicle:  (If a car transfer is meaningful for pt, can work on)    Assistance Recommended at Discharge PRN  Patient can return home with the following Assist for transportation   Equipment Recommendations  Wheelchair (measurements PT);Other (comment) (tub bench)    Recommendations for Other Services Other (comment) (Recommend starting the Mental/Behavioral Health support through [name] to start while [place] is here in the hospital)     Precautions / Restrictions Precautions Precautions: Fall Precaution Comments: R knee disrticulation, L BKA; Fall risk is present, but minimal Restrictions Weight Bearing Restrictions: No RLE Weight Bearing: Weight bearing as tolerated LLE Weight Bearing: Weight bearing as tolerated Other Position/Activity Restrictions: Per chat with Dr. Lajoyce Corners, "patient may be weightbearing as tolerated with both upper and lower extremities without restrictions"     Mobility  Bed Mobility                 Patient Response: Cooperative  Transfers Overall transfer level: Modified independent                Lateral/Scoot Transfers: Modified independent (Device/Increase time) General transfer comment: From wc <> mat table; [name] employed the anterior posterior technique, but with the wheelchair parked sideways by the mat table, making in necessary for him to push his body up over the wheel; in doing so today, he pushed up for the middle of the back of teh wheelchair, much like he has done with his  previous wheelchair, which puts the wheelchair at risk of tipping    Ambulation/Gait                   Set designer Details (indicate cue type and reason): Spends lots of time in the wc throughout the day;  cruising hallways  Modified Rankin (Stroke Patients Only)       Balance                                            Cognition Arousal/Alertness: Awake/alert Behavior During Therapy: WFL for tasks assessed/performed Overall Cognitive Status: Within Functional Limits for tasks assessed                                 General Comments: When [name] understands rationale for an exercise or activity, and that rationale is in line with his goals, he participates much better        Exercises      General Comments General comments (skin integrity, edema, etc.): Reports he was sore from exercising yesterday, and requested to not work on getting up on stepstool/chair (on mat table) today; seemed very interested in getting PT session done and moving on today      Pertinent Vitals/Pain Pain Assessment Pain Assessment: Faces Faces Pain Scale: Hurts a little bit Pain Location: end of R limb Pain Descriptors / Indicators: Grimacing Pain Intervention(s): Monitored during session    Home Living                          Prior Function            PT Goals (current goals can now be found in the care plan section) Acute Rehab PT Goals Patient Stated Goal: Prepare for prosthetics fitting; Would like to get housing in [facility] through the Transition to [employer] PT Goal Formulation: With patient Time For Goal Achievement: 04/01/23 Potential to Achieve Goals: Good Progress towards PT goals: Progressing toward goals    Frequency    Min 1X/week      PT Plan Current plan remains appropriate    Co-evaluation              AM-PAC PT "6 Clicks" Mobility   Outcome Measure  Help needed turning from your back to your side while in a flat bed without using bedrails?: None Help needed moving from lying on your back to sitting on the side of a flat bed without using bedrails?: None Help needed moving to and from a bed to  a chair (including a wheelchair)?: None Help needed standing up from a chair using your arms (e.g., wheelchair or bedside chair)?: Total Help needed to walk in hospital room?: Total Help needed climbing 3-5 steps with a railing? : Total 6 Click Score: 15    End of Session   Activity Tolerance: Patient tolerated treatment well Patient left: Other (comment) (seated in wheelchair in room) Nurse Communication: Mobility status PT Visit Diagnosis: Muscle weakness (generalized) (M62.81);Other abnormalities of gait and mobility (R26.89)     Time: 1030-1101 PT Time Calculation (min) (ACUTE ONLY): 31 min  Charges:  $Therapeutic Exercise: 8-22 mins $Therapeutic Activity: 8-22 mins  Van Clines, PT  Acute Rehabilitation Services Office [phone]    [name] 04/14/2023, 3:28 PM

## 2023-04-15 DIAGNOSIS — L0390 Cellulitis, unspecified: Secondary | ICD-10-CM | POA: Diagnosis not present

## 2023-04-15 DIAGNOSIS — I96 Gangrene, not elsewhere classified: Secondary | ICD-10-CM | POA: Diagnosis not present

## 2023-04-15 DIAGNOSIS — L03116 Cellulitis of left lower limb: Secondary | ICD-10-CM | POA: Diagnosis not present

## 2023-04-15 DIAGNOSIS — Z5900 Homelessness unspecified: Secondary | ICD-10-CM | POA: Diagnosis not present

## 2023-04-15 NOTE — Progress Notes (Signed)
Mobility Specialist Progress Note   04/15/23 1440  Mobility  Activity Ambulated independently in hallway  Level of Assistance Independent  Assistive Device Wheelchair  Distance Ambulated (ft) 550 ft  Range of Motion/Exercises Active;All extremities  Activity Response Tolerated well   Patient propelled in WC independently. Needed to make an important phone call and asked to return after to continue session. Was left in room with all needs met.   [place] Zephyr Ridley, BS EXP Mobility Specialist Please contact via SecureChat or Rehab office at [phone]

## 2023-04-15 NOTE — TOC Progression Note (Signed)
Transition of Care [facility]) - Progression Note    Patient Details  Name: [name] MRN: [id number] Date of Birth: 22-Oct-1966  Transition of Care [facility]) CM/SW Contact  [name], [name] Phone Number: 04/15/2023, 11:16 AM  Clinical Narrative:    CSW spoke with patient at bedside to inform him of automated email that was received from [employer] regarding his debit card. Patient reports he spoke with [employer] to have the electronic transfers stopped and requested to continue receiving paper checks that are sent to [name] of the [facility] Behavioral Health Response Team. Patient did not have additional questions.  Patient remains hospitalized due to barriers that include homelessness and history of substance abuse prohibiting placement at a SNF. [name]) is working to find patient secure housing that is wheelchair accessible.   Expected Discharge Plan: Skilled Nursing Facility Barriers to Discharge: Continued Medical Work up, SNF Pending bed offer  Expected Discharge Plan and Services   Discharge Planning Services: CM Consult Post Acute Care Choice: Skilled Nursing Facility Living arrangements for the past 2 months: Homeless                                       Social Determinants of Health (SDOH) Interventions SDOH Screenings   Food Insecurity: Food Insecurity Present (11/24/2022)  Housing: Medium Risk (12/07/2022)  Transportation Needs: Unmet Transportation Needs (12/07/2022)  Utilities: Not At Risk (12/07/2022)  Alcohol Screen: Medium Risk (06/24/2019)  Depression (PHQ2-9): Medium Risk (02/10/2021)  Tobacco Use: High Risk (03/26/2023)    Readmission Risk Interventions    12/09/2022    9:02 AM 12/01/2022    3:56 PM 08/18/2022    9:00 AM  Readmission Risk Prevention Plan  Transportation Screening Complete Complete Complete  Medication Review [employer]) Complete Complete Complete  PCP or Specialist appointment within 3-5 days of discharge Complete Complete  Complete  HRI or Home Care Consult Complete Complete Complete  SW Recovery Care/Counseling Consult Complete Complete Complete  Palliative Care Screening Not Applicable Not Applicable Not Applicable  Skilled Nursing Facility Not Applicable Not Applicable Not Applicable  SNF Comments  n/a

## 2023-04-15 NOTE — Progress Notes (Signed)
PROGRESS NOTE    [name]  UEA:[id number] DOB: January 16, 1966 DOA: 12/07/2022 PCP: [name], NP   Brief Narrative: [name] is a 57 y.o. male with a history of heart failure, CAD, right BKA, COPD, hyperlipidemia, hyperlipidemia, polyneuropathy, schizoaffective/bipolar disorder, was admitted to the hospital with left lower extremity cellulitis and gangrene.  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    During hospitalization, patient was also noted to have maxillary abscess and periapical abscess and underwent oral surgery with I&D and of anterior maxillary abscess and surgical extraction of multiple teeth in alveoloplasty. Currently stable.  Hospital stay prolonged due to difficulty in placement due to homelessness status status post bilateral BKA.  Assessment and Plan:  Sepsis secondary to cellulitis/gangrene of left foot Initially received vancomycin, Cefepime and Flagyl.  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  Previous history of right below-knee amputation.  Currently unsafe for disposition.  Patient is wheelchair-bound.   Anterior maxillary abscess Status post oral surgery with I&D of anterior maxillary abscess, surgical extraction of teeth 4,5,6,11,12,13,20,21,22,27,28,29. Simple extraction teeth #7,8,9,10,23,24,25,26, Alveoloplasty x 4 quadrants on 03/25/23 Dr [name].  Patient has completed Augmentin x 7 days. Continue chlorhexidine rinse x 10 days 3 times daily. Tolerating regular diet.  Chronic combined systolic and diastolic heart failure Compensated.  Continue Lasix, Coreg, hydralazine, Imdur.  Currently on Imdur.  Reviewed recent 2D echocardiogram from 08/2022 with LV ejection fraction of 40% with global hypokinesis.  Blood pressure seems to be stable.  Essential hypertension Continue Coreg, amlodipine, Imdur.  Will continue to monitor.  COPD Compensated. Continue Dulera BID and albuterol.     Anxiety -Continue Atarax PRN  GERD -Continue Protonix  Constipation -Continue bowel regimen with MiraLAX , Dulcolax PRN.  On narcotics  Chronic pain On MS Contin 30 mg BID   To be supervised.  Schizoaffective disorder Seen by psychiatry during hospitalization.  Continue Abilify and Lexapro.    DVT prophylaxis: Lovenox subcu  Code Status:   Code Status: Full Code  Family Communication:  None at bedside.  Disposition Plan:  Medically stable for discharge.  Difficult disposition.   Consultants:  Orthopedic surgery Psychiatry  Procedures:  12/10/22: Left BKA Status post oral surgery with I&D of anterior maxillary abscess, surgical extraction of teeth 4,5,6,11,12,13,20,21,22,27,28,29. Simple extraction teeth #7,8,9,10,23,24,25,26, Alveoloplasty x 4 quadrants on 03/25/23 Dr [name].  Antimicrobials: None currently.  Subjective: Today, patient was seen and examined at bedside.  Denies any nausea, vomiting, fever chills or rigor.    Objective: BP (!) 148/97 (BP Location: Left Arm)   Pulse (!) 57   Temp 98 F (36.7 C)   Resp 17   Ht 5\' 9"  (1.753 m)   Wt 90.8 kg   SpO2 100%   BMI 29.56 kg/m   Examination: Body mass index is 29.56 kg/m.   General:  Average built, not in obvious distress HENT: No scleral pallor or icterus noted. Oral mucosa is moist.  Status post dental extraction. Chest:   Diminished breath sounds bilaterally. No crackles or wheezes.  CVS: S1 &S2 heard. No murmur.  Regular rate and rhythm. Abdomen: Soft, nontender, nondistended.  Bowel sounds are heard.   Extremities:   Bilateral below-knee amputation. Psych: Alert, awake and oriented, normal mood CNS:  No cranial nerve deficits.  Moves upper extremities. Skin: Warm and dry.  Bilateral below-knee amputation.  Data Reviewed: I have personally reviewed the following labs and imaging studies.    CBC Lab Results  Component Value Date   WBC 5.9 03/20/2023   RBC 4.94 03/20/2023   HGB 14.8  03/20/2023   HCT 43.6 03/20/2023   MCV 88.3 03/20/2023   MCH 30.0 03/20/2023   PLT 208 03/20/2023   MCHC 33.9 03/20/2023   RDW 14.8 03/20/2023   LYMPHSABS 2.1 12/27/2022   MONOABS 0.7 12/27/2022   EOSABS 0.1 12/27/2022   BASOSABS 0.1 12/27/2022     Last metabolic panel Lab Results  Component Value Date   NA 139 03/18/2023   K 3.8 03/18/2023   CL 106 03/18/2023   CO2 23 03/18/2023   BUN 15 03/18/2023   CREATININE 0.86 04/01/2023   GLUCOSE 163 (H) 03/18/2023   GFRNONAA >60 04/01/2023   GFRAA >60 08/21/2020   CALCIUM 9.3 03/18/2023   PHOS 4.7 (H) 07/18/2022   PROT 7.4 03/04/2023   ALBUMIN 3.5 03/04/2023   BILITOT 0.4 03/04/2023   ALKPHOS 62 03/04/2023   AST 17 03/04/2023   ALT 16 03/04/2023   ANIONGAP 10 03/18/2023    GFR: Estimated Creatinine Clearance: 109.3 mL/min (by C-G formula based on SCr of 0.86 mg/dL).  No results found for this or any previous visit (from the past 240 hour(s)).    Radiology Studies: No results found.    LOS: 129 days    Joycelyn Das, MD [facility] 04/15/2023, 12:59 PM If 7PM-7AM, please contact night-coverage [email or web address]

## 2023-04-16 DIAGNOSIS — L03116 Cellulitis of left lower limb: Secondary | ICD-10-CM | POA: Diagnosis not present

## 2023-04-16 DIAGNOSIS — I96 Gangrene, not elsewhere classified: Secondary | ICD-10-CM | POA: Diagnosis not present

## 2023-04-16 DIAGNOSIS — Z5900 Homelessness unspecified: Secondary | ICD-10-CM | POA: Diagnosis not present

## 2023-04-16 DIAGNOSIS — L0390 Cellulitis, unspecified: Secondary | ICD-10-CM | POA: Diagnosis not present

## 2023-04-16 NOTE — Plan of Care (Signed)
  Problem: Coping: Goal: Level of anxiety will decrease Outcome: Not Progressing   Problem: Elimination: Goal: Will not experience complications related to bowel motility Outcome: Not Progressing Goal: Will not experience complications related to urinary retention Outcome: Not Progressing   Problem: Pain Managment: Goal: General experience of comfort will improve Outcome: Not Progressing   Problem: Safety: Goal: Ability to remain free from injury will improve Outcome: Not Progressing   Problem: Skin Integrity: Goal: Risk for impaired skin integrity will decrease Outcome: Not Progressing   Problem: Education: Goal: Knowledge of General Education information will improve Description: Including pain rating scale, medication(s)/side effects and non-pharmacologic comfort measures Outcome: Not Progressing   Problem: Health Behavior/Discharge Planning: Goal: Ability to manage health-related needs will improve Outcome: Not Progressing   Problem: Clinical Measurements: Goal: Ability to maintain clinical measurements within normal limits will improve Outcome: Not Progressing Goal: Will remain free from infection Outcome: Not Progressing Goal: Diagnostic test results will improve Outcome: Not Progressing Goal: Respiratory complications will improve Outcome: Not Progressing Goal: Cardiovascular complication will be avoided Outcome: Not Progressing   Problem: Activity: Goal: Risk for activity intolerance will decrease Outcome: Not Progressing   Problem: Nutrition: Goal: Adequate nutrition will be maintained Outcome: Not Progressing   Problem: Coping: Goal: Level of anxiety will decrease Outcome: Not Progressing   Problem: Elimination: Goal: Will not experience complications related to bowel motility Outcome: Not Progressing Goal: Will not experience complications related to urinary retention Outcome: Not Progressing

## 2023-04-16 NOTE — Progress Notes (Signed)
PROGRESS NOTE    [name]  OZH:[id number] DOB: 1966/05/20 DOA: 12/07/2022 PCP: [name], NP   Brief Narrative: [name] is a 57 y.o. male with a history of heart failure, CAD, right BKA, COPD, hyperlipidemia, hyperlipidemia, polyneuropathy, schizoaffective/bipolar disorder, was admitted to the hospital with left lower extremity cellulitis and gangrene.  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    During hospitalization, patient was also noted to have maxillary abscess and periapical abscess and underwent oral surgery with I&D and of anterior maxillary abscess and surgical extraction of multiple teeth in alveoloplasty. Currently stable.  Hospital stay prolonged due to difficulty in placement due to homelessness status status post bilateral BKA.  Assessment and Plan:  Sepsis secondary to cellulitis/gangrene of left foot Initially received vancomycin, Cefepime and Flagyl.  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  Previous history of right below-knee amputation.  Currently unsafe for disposition.  Patient is wheelchair-bound.   Anterior maxillary abscess Status post oral surgery with I&D of anterior maxillary abscess, surgical extraction of teeth 4,5,6,11,12,13,20,21,22,27,28,29. Simple extraction teeth #7,8,9,10,23,24,25,26, Alveoloplasty x 4 quadrants on 03/25/23 Dr [name].  Patient has completed Augmentin x 7 days. Continue chlorhexidine rinse.. Tolerating regular fluid consistency diet.  Chronic combined systolic and diastolic heart failure Compensated.  Continue Lasix, Coreg, hydralazine, Imdur.  Currently on Imdur.  Reviewed recent 2D echocardiogram from 08/2022 with LV ejection fraction of 40% with global hypokinesis.  Blood pressure seems to be stable.  Essential hypertension Continue Coreg, amlodipine, Imdur.  Will continue to monitor.  COPD Compensated. Continue Dulera BID and albuterol.     Anxiety -Continue Atarax PRN  GERD -Continue Protonix  Constipation -Continue bowel regimen with MiraLAX , Dulcolax PRN.  On narcotics  Chronic pain On MS Contin 30 mg BID   To be supervised.  Schizoaffective disorder Seen by psychiatry during hospitalization.  Continue Abilify and Lexapro.    DVT prophylaxis: Lovenox subcu  Code Status:   Code Status: Full Code  Family Communication:  None at bedside.  Disposition Plan:  Medically stable for discharge.  Difficult disposition.   Consultants:  Orthopedic surgery Psychiatry  Procedures:  12/10/22: Left BKA Status post oral surgery with I&D of anterior maxillary abscess, surgical extraction of teeth 4,5,6,11,12,13,20,21,22,27,28,29. Simple extraction teeth #7,8,9,10,23,24,25,26, Alveoloplasty x 4 quadrants on 03/25/23 Dr [name].  Antimicrobials: None currently.  Subjective: Today, patient was seen and examined at bedside.  Denies any nausea vomiting fever chills or rigor.    Objective: BP (!) 148/82 (BP Location: Left Arm)   Pulse 67   Temp 97.7 F (36.5 C) (Oral)   Resp 17   Ht 5\' 9"  (1.753 m)   Wt 90.8 kg   SpO2 98%   BMI 29.56 kg/m   Examination: Body mass index is 29.56 kg/m.   General:  Average built, not in obvious distress HENT: No scleral pallor or icterus noted. Oral mucosa is moist.  Status post dental extraction. Chest:   Diminished breath sounds bilaterally. No crackles or wheezes.  CVS: S1 &S2 heard. No murmur.  Regular rate and rhythm. Abdomen: Soft, nontender, nondistended.  Bowel sounds are heard.   Extremities: Status post bilateral below-knee amputation. Psych: Alert, awake and oriented, normal mood CNS:  No cranial nerve deficits.  Moves upper extremities. Skin: Warm and dry.  Bilateral below-knee amputation.  Data Reviewed: I have personally reviewed the following labs and imaging studies.    CBC Lab Results  Component Value Date  WBC 5.9 03/20/2023   RBC 4.94 03/20/2023   HGB  14.8 03/20/2023   HCT 43.6 03/20/2023   MCV 88.3 03/20/2023   MCH 30.0 03/20/2023   PLT 208 03/20/2023   MCHC 33.9 03/20/2023   RDW 14.8 03/20/2023   LYMPHSABS 2.1 12/27/2022   MONOABS 0.7 12/27/2022   EOSABS 0.1 12/27/2022   BASOSABS 0.1 12/27/2022     Last metabolic panel Lab Results  Component Value Date   NA 139 03/18/2023   K 3.8 03/18/2023   CL 106 03/18/2023   CO2 23 03/18/2023   BUN 15 03/18/2023   CREATININE 0.86 04/01/2023   GLUCOSE 163 (H) 03/18/2023   GFRNONAA >60 04/01/2023   GFRAA >60 08/21/2020   CALCIUM 9.3 03/18/2023   PHOS 4.7 (H) 07/18/2022   PROT 7.4 03/04/2023   ALBUMIN 3.5 03/04/2023   BILITOT 0.4 03/04/2023   ALKPHOS 62 03/04/2023   AST 17 03/04/2023   ALT 16 03/04/2023   ANIONGAP 10 03/18/2023    GFR: Estimated Creatinine Clearance: 109.3 mL/min (by C-G formula based on SCr of 0.86 mg/dL).  No results found for this or any previous visit (from the past 240 hour(s)).    Radiology Studies: No results found.    LOS: 130 days    Joycelyn Das, MD [facility] 04/16/2023, 11:17 AM If 7PM-7AM, please contact night-coverage [email or web address]

## 2023-04-16 NOTE — Progress Notes (Signed)
Mobility Specialist Progress Note   04/16/23 1208  Mobility  Activity Transferred from bed to chair;Transferred from chair to bed (UE exercise)  Level of Assistance Modified independent, requires aide device or extra time  Assistive Device Wheelchair  Range of Motion/Exercises Active;All extremities  Activity Response Tolerated well   Patient received in [facility] and agreeable to participate. First session, patient propelled in hallway 574ft independently. Asked to return later to complete second half of session. Second session focused on transferring to and from [facility]. Completed UE exercise x3 with anterior/posterior transfers between each set. Required extra time secondary to UE fatigue. Tolerated without complaint or incident. Was left in [facility] with all needs met.  [place] Maxxwell Edgett, BS EXP Mobility Specialist Please contact via SecureChat or Rehab office at [phone]

## 2023-04-17 DIAGNOSIS — L0390 Cellulitis, unspecified: Secondary | ICD-10-CM | POA: Diagnosis not present

## 2023-04-17 DIAGNOSIS — Z5900 Homelessness unspecified: Secondary | ICD-10-CM | POA: Diagnosis not present

## 2023-04-17 DIAGNOSIS — L03116 Cellulitis of left lower limb: Secondary | ICD-10-CM | POA: Diagnosis not present

## 2023-04-17 DIAGNOSIS — I96 Gangrene, not elsewhere classified: Secondary | ICD-10-CM | POA: Diagnosis not present

## 2023-04-17 MED ORDER — MEDIHONEY WOUND/BURN DRESSING EX PSTE
1.0000 | PASTE | Freq: Every day | CUTANEOUS | Status: DC
  Filled 2023-04-17: qty 44

## 2023-04-17 NOTE — Progress Notes (Signed)
PROGRESS NOTE    [name]  UVO:[id number] DOB: [birth year] DOA: 12/07/2022 PCP: [name] Sharps, NP   Brief Narrative: [name] is a 57 y.o. male with a history of heart failure, CAD, right BKA, COPD, hyperlipidemia, hyperlipidemia, polyneuropathy, schizoaffective/bipolar disorder, was admitted to the hospital with left lower extremity cellulitis and gangrene.  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    During hospitalization, patient was also noted to have maxillary abscess and periapical abscess and underwent oral surgery with I&D and of anterior maxillary abscess and surgical extraction of multiple teeth in alveoloplasty. Currently stable.  Hospital stay prolonged due to difficulty in placement due to homelessness status status post bilateral BKA.  Assessment and Plan:  Sepsis secondary to cellulitis/gangrene of left foot Initially received vancomycin, Cefepime and Flagyl.  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  Previous history of right below-knee amputation.  Currently unsafe for disposition.  Patient is wheelchair-bound.  This physiology has resolved at this time.  Anterior maxillary abscess Status post oral surgery with I&D of anterior maxillary abscess, surgical extraction of teeth 4,5,6,11,12,13,20,21,22,27,28,29. Simple extraction teeth #7,8,9,10,23,24,25,26, Alveoloplasty x 4 quadrants on 03/25/23 Dr [name].  Patient has completed Augmentin x 7 days.  Continue oral send.  Tolerating regular fluid consistency diet.  Chronic combined systolic and diastolic heart failure Compensated.  Continue Lasix, Coreg, hydralazine, Imdur.  Currently on Imdur.  Reviewed recent 2D echocardiogram from 08/2022 with LV ejection fraction of 40% with global hypokinesis.  Blood pressure seems to be slightly elevated today we will continue to monitor.  Essential hypertension Continue Coreg, amlodipine, Imdur.  Will  continue to monitor.  COPD Compensated. Continue Dulera BID and albuterol.    Anxiety -Continue Atarax PRN  GERD -Continue Protonix  Constipation -Continue bowel regimen with MiraLAX , Dulcolax PRN.  On narcotics  Chronic pain On MS Contin 30 mg BID   To be supervised for concerns of previous hoarding medication.  Schizoaffective disorder Seen by psychiatry during hospitalization.  Continue Abilify and Lexapro.    DVT prophylaxis: Lovenox subcu  Code Status:   Code Status: Full Code  Family Communication:  None at bedside.  Disposition Plan:  Medically stable for discharge.  Difficult disposition.   Consultants:  Orthopedic surgery Psychiatry  Procedures:  12/10/22: Left BKA Status post oral surgery with I&D of anterior maxillary abscess, surgical extraction of teeth 4,5,6,11,12,13,20,21,22,27,28,29. Simple extraction teeth #7,8,9,10,23,24,25,26, Alveoloplasty x 4 quadrants on 03/25/23 Dr [name].  Antimicrobials: None currently.  Subjective: Today, patient was seen and examined at bedside.  Patient denies any nausea, vomiting, fever, chills or rigor.    Objective: BP (!) 153/103 (BP Location: Left Arm)   Pulse 75   Temp 98.3 F (36.8 C)   Resp 18   Ht 5\' 9"  (1.753 m)   Wt 90.8 kg   SpO2 96%   BMI 29.56 kg/m   Examination: Body mass index is 29.56 kg/m.   General:  Average built, not in obvious distress HENT:   No scleral pallor or icterus noted. Oral mucosa is moist.  Status post dental extraction Chest:    Diminished breath sounds bilaterally. No crackles or wheezes.  CVS: S1 &S2 heard. No murmur.  Regular rate and rhythm. Abdomen: Soft, nontender, nondistended.  Bowel sounds are heard.   Extremities: Status post bilateral below-knee amputation. Psych: Alert, awake and oriented, normal mood CNS:  No cranial nerve deficits.  Moves upper extremities. Skin: Warm and dry.  Bilateral  below-knee amputation.  Data Reviewed: I have personally reviewed the  following labs and imaging studies.    CBC Lab Results  Component Value Date   WBC 5.9 03/20/2023   RBC 4.94 03/20/2023   HGB 14.8 03/20/2023   HCT 43.6 03/20/2023   MCV 88.3 03/20/2023   MCH 30.0 03/20/2023   PLT 208 03/20/2023   MCHC 33.9 03/20/2023   RDW 14.8 03/20/2023   LYMPHSABS 2.1 12/27/2022   MONOABS 0.7 12/27/2022   EOSABS 0.1 12/27/2022   BASOSABS 0.1 12/27/2022     Last metabolic panel Lab Results  Component Value Date   NA 139 03/18/2023   K 3.8 03/18/2023   CL 106 03/18/2023   CO2 23 03/18/2023   BUN 15 03/18/2023   CREATININE 0.86 04/01/2023   GLUCOSE 163 (H) 03/18/2023   GFRNONAA >60 04/01/2023   GFRAA >60 08/21/2020   CALCIUM 9.3 03/18/2023   PHOS 4.7 (H) 07/18/2022   PROT 7.4 03/04/2023   ALBUMIN 3.5 03/04/2023   BILITOT 0.4 03/04/2023   ALKPHOS 62 03/04/2023   AST 17 03/04/2023   ALT 16 03/04/2023   ANIONGAP 10 03/18/2023    GFR: Estimated Creatinine Clearance: 109.3 mL/min (by C-G formula based on SCr of 0.86 mg/dL).  No results found for this or any previous visit (from the past 240 hour(s)).    Radiology Studies: No results found.    LOS: 131 days    Joycelyn Das, MD [facility] 04/17/2023, 11:20 AM If 7PM-7AM, please contact night-coverage [email or web address]

## 2023-04-17 NOTE — TOC Progression Note (Signed)
Transition of Care [facility]) - Progression Note    Patient Details  Name: Seleem Sikich MRN: [id number] Date of Birth: [birth year]  Transition of Care [facility]) CM/SW Contact  [name], [name] Phone Number: 04/17/2023, 9:15 AM  Clinical Narrative:    CSW continues to collaborate and discuss patient's housing needs with [facility] staff that are working on identifying accessible options for him to be discharged to. [name] staff stated that patient will be responsible for 25% of the rent for the residence and the remaining 75% will be covered by [facility] funding.    Expected Discharge Plan and Services   Discharge Planning Services: CM Consult Living arrangements for the past 2 months: Homeless                      Social Determinants of Health (SDOH) Interventions SDOH Screenings   Food Insecurity: Food Insecurity Present (11/24/2022)  Housing: Medium Risk (12/07/2022)  Transportation Needs: Unmet Transportation Needs (12/07/2022)  Utilities: Not At Risk (12/07/2022)  Alcohol Screen: Medium Risk (06/24/2019)  Depression (PHQ2-9): Medium Risk (02/10/2021)  Tobacco Use: High Risk (03/26/2023)    Readmission Risk Interventions    12/09/2022    9:02 AM 12/01/2022    3:56 PM 08/18/2022    9:00 AM  Readmission Risk Prevention Plan  Transportation Screening Complete Complete Complete  Medication Review Oceanographer) Complete Complete Complete  PCP or Specialist appointment within 3-5 days of discharge Complete Complete Complete  HRI or Home Care Consult Complete Complete Complete  SW Recovery Care/Counseling Consult Complete Complete Complete  Palliative Care Screening Not Applicable Not Applicable Not Applicable  Skilled Nursing Facility Not Applicable Not Applicable Not Applicable  SNF Comments  n/a

## 2023-04-17 NOTE — Plan of Care (Signed)
  Problem: Coping: Goal: Level of anxiety will decrease Outcome: Completed/Met   Problem: Elimination: Goal: Will not experience complications related to bowel motility Outcome: Completed/Met Goal: Will not experience complications related to urinary retention Outcome: Completed/Met   

## 2023-04-17 NOTE — Plan of Care (Signed)
  Problem: Coping: Goal: Level of anxiety will decrease Outcome: Progressing   Problem: Elimination: Goal: Will not experience complications related to bowel motility Outcome: Progressing Goal: Will not experience complications related to urinary retention Outcome: Progressing   Problem: Pain Managment: Goal: General experience of comfort will improve Outcome: Progressing   Problem: Safety: Goal: Ability to remain free from injury will improve Outcome: Progressing   Problem: Skin Integrity: Goal: Risk for impaired skin integrity will decrease Outcome: Progressing   Problem: Education: Goal: Knowledge of General Education information will improve Description: Including pain rating scale, medication(s)/side effects and non-pharmacologic comfort measures Outcome: Progressing   Problem: Health Behavior/Discharge Planning: Goal: Ability to manage health-related needs will improve Outcome: Progressing   

## 2023-04-17 NOTE — Progress Notes (Signed)
Mobility Specialist Progress Note:   04/17/23 1550  Mobility  Activity  (self-propelled w/c outside)  Level of Assistance Modified independent, requires aide device or extra time  Assistive Device Wheelchair  Distance Ambulated (ft) 500 ft  RLE Weight Bearing NWB  LLE Weight Bearing NWB  Activity Response Tolerated well  Mobility Referral Yes  $Mobility charge 1 Mobility  Mobility Specialist Start Time (ACUTE ONLY) 1550  Mobility Specialist Stop Time (ACUTE ONLY) 1610  Mobility Specialist Time Calculation (min) (ACUTE ONLY) 20 min   Pt eager for mobility session. Self-propelled in w/c with no assistance. Pt back in room with all needs met.   [name] Mobility Specialist Please contact via SecureChat or  Rehab office at [phone]

## 2023-04-18 DIAGNOSIS — L0390 Cellulitis, unspecified: Secondary | ICD-10-CM | POA: Diagnosis not present

## 2023-04-18 DIAGNOSIS — Z5900 Homelessness unspecified: Secondary | ICD-10-CM | POA: Diagnosis not present

## 2023-04-18 DIAGNOSIS — I96 Gangrene, not elsewhere classified: Secondary | ICD-10-CM | POA: Diagnosis not present

## 2023-04-18 DIAGNOSIS — L03116 Cellulitis of left lower limb: Secondary | ICD-10-CM | POA: Diagnosis not present

## 2023-04-18 MED ORDER — CARVEDILOL 6.25 MG PO TABS
6.2500 mg | ORAL_TABLET | Freq: Two times a day (BID) | ORAL | Status: DC
  Administered 2023-04-18 – 2023-06-06 (×94): 6.25 mg via ORAL
  Filled 2023-04-18 (×95): qty 1

## 2023-04-18 NOTE — Plan of Care (Signed)
  Problem: Pain Managment: Goal: General experience of comfort will improve Outcome: Progressing   Problem: Safety: Goal: Ability to remain free from injury will improve Outcome: Progressing   Problem: Skin Integrity: Goal: Risk for impaired skin integrity will decrease Outcome: Progressing   Problem: Activity: Goal: Risk for activity intolerance will decrease Outcome: Progressing   Problem: Pain Managment: Goal: General experience of comfort will improve Outcome: Progressing   Problem: Skin Integrity: Goal: Risk for impaired skin integrity will decrease Outcome: Progressing

## 2023-04-18 NOTE — Progress Notes (Signed)
PROGRESS NOTE    [name]  ZOX:[id number] DOB: 1966/04/21 DOA: 12/07/2022 PCP: [name], NP   Brief Narrative: [name] is a 57 y.o. male with a history of heart failure, CAD, right BKA, COPD, hyperlipidemia, hyperlipidemia, polyneuropathy, schizoaffective/bipolar disorder, was admitted to the hospital with left lower extremity cellulitis and gangrene.  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    During hospitalization, patient was also noted to have maxillary abscess and periapical abscess and underwent oral surgery with I&D and of anterior maxillary abscess and surgical extraction of multiple teeth in alveoloplasty. Currently stable.  Hospital stay prolonged due to difficulty in placement due to homelessness status status post bilateral BKA.  Assessment and Plan:  Sepsis secondary to cellulitis/gangrene of left foot Initially received vancomycin, Cefepime and Flagyl.  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  Previous history of right below-knee amputation.  Currently unsafe for disposition.  Patient is wheelchair-bound.  Abscess physiology has resolved at this time.  Anterior maxillary abscess Status post oral surgery with I&D of anterior maxillary abscess, surgical extraction of teeth 4,5,6,11,12,13,20,21,22,27,28,29. Simple extraction teeth #7,8,9,10,23,24,25,26, Alveoloplasty x 4 quadrants on 03/25/23 Dr [name].  Patient has completed Augmentin x 7 days.  Continue oral hygiene.  Currently on regular fluid consistency diet.  Chronic combined systolic and diastolic heart failure Compensated.  Continue Lasix, Coreg, hydralazine, Imdur. 2D echocardiogram from 08/2022 with LV ejection fraction of 40% with global hypokinesis.  Will adjust the dose of Coreg today.    Essential hypertension Accelerated today.  Patient is on Coreg amlodipine and Imdur.  Will increase the dose of Coreg from 3.125 to 6.25 mg  at this time and closely monitor.  Continue Coreg, amlodipine, Imdur.    COPD Compensated. Continue Dulera BID and albuterol.    Anxiety -Continue Atarax PRN  GERD -Continue Protonix  Constipation -Continue bowel regimen with MiraLAX , Dulcolax PRN.  On narcotics  Chronic pain On MS Contin 30 mg BID   To be supervised for concerns of previous hoarding medication.  Schizoaffective disorder Seen by psychiatry during hospitalization.  Continue Abilify and Lexapro.    DVT prophylaxis: Lovenox subcu  Code Status:   Code Status: Full Code  Family Communication:  None at bedside.  Disposition Plan:  Medically stable for discharge.  Difficult disposition.  Consultants:  Orthopedic surgery Psychiatry  Procedures:  12/10/22: Left BKA Status post oral surgery with I&D of anterior maxillary abscess, surgical extraction of teeth 4,5,6,11,12,13,20,21,22,27,28,29. Simple extraction teeth #7,8,9,10,23,24,25,26, Alveoloplasty x 4 quadrants on 03/25/23 Dr [name].  Antimicrobials: None currently.  Subjective: Today, patient was seen and examined at bedside.  Patient states that he does not wish to blood work in the morning and reports elevated blood pressure.    Objective: BP (!) 181/98 (BP Location: Left Arm)   Pulse 61   Temp 97.9 F (36.6 C) (Oral)   Resp 17   Ht 5\' 9"  (1.753 m)   Wt 90.8 kg   SpO2 99%   BMI 29.56 kg/m   Examination: Body mass index is 29.56 kg/m.   General:  Average built, not in obvious distress, alert awake and Communicative. HENT:   No scleral pallor or icterus noted. Oral mucosa is moist.  Status post dental extraction Chest:    Diminished breath sounds bilaterally. No crackles or wheezes.  CVS: S1 &S2 heard. No murmur.  Regular rate and rhythm. Abdomen: Soft, nontender, nondistended.  Bowel sounds are heard.  Extremities: Status post bilateral below-knee amputation. Psych: Alert, awake and oriented, normal mood CNS:  No cranial nerve deficits.   Moves upper extremities. Skin: Warm and dry.  Bilateral lower extremity below-knee amputation.  Data Reviewed: I have personally reviewed the following labs and imaging studies.    CBC Lab Results  Component Value Date   WBC 5.9 03/20/2023   RBC 4.94 03/20/2023   HGB 14.8 03/20/2023   HCT 43.6 03/20/2023   MCV 88.3 03/20/2023   MCH 30.0 03/20/2023   PLT 208 03/20/2023   MCHC 33.9 03/20/2023   RDW 14.8 03/20/2023   LYMPHSABS 2.1 12/27/2022   MONOABS 0.7 12/27/2022   EOSABS 0.1 12/27/2022   BASOSABS 0.1 12/27/2022     Last metabolic panel Lab Results  Component Value Date   NA 139 03/18/2023   K 3.8 03/18/2023   CL 106 03/18/2023   CO2 23 03/18/2023   BUN 15 03/18/2023   CREATININE 0.86 04/01/2023   GLUCOSE 163 (H) 03/18/2023   GFRNONAA >60 04/01/2023   GFRAA >60 08/21/2020   CALCIUM 9.3 03/18/2023   PHOS 4.7 (H) 07/18/2022   PROT 7.4 03/04/2023   ALBUMIN 3.5 03/04/2023   BILITOT 0.4 03/04/2023   ALKPHOS 62 03/04/2023   AST 17 03/04/2023   ALT 16 03/04/2023   ANIONGAP 10 03/18/2023    GFR: Estimated Creatinine Clearance: 109.3 mL/min (by C-G formula based on SCr of 0.86 mg/dL).  No results found for this or any previous visit (from the past 240 hour(s)).    Radiology Studies: No results found.    LOS: 132 days    Joycelyn Das, MD [facility] 04/18/2023, 10:56 AM If 7PM-7AM, please contact night-coverage [email or web address]

## 2023-04-19 DIAGNOSIS — Z5900 Homelessness unspecified: Secondary | ICD-10-CM | POA: Diagnosis not present

## 2023-04-19 DIAGNOSIS — L0390 Cellulitis, unspecified: Secondary | ICD-10-CM | POA: Diagnosis not present

## 2023-04-19 DIAGNOSIS — L03116 Cellulitis of left lower limb: Secondary | ICD-10-CM | POA: Diagnosis not present

## 2023-04-19 DIAGNOSIS — I96 Gangrene, not elsewhere classified: Secondary | ICD-10-CM | POA: Diagnosis not present

## 2023-04-19 NOTE — Plan of Care (Signed)
  Problem: Pain Managment: Goal: General experience of comfort will improve Outcome: Progressing   Problem: Safety: Goal: Ability to remain free from injury will improve Outcome: Progressing   Problem: Skin Integrity: Goal: Risk for impaired skin integrity will decrease Outcome: Progressing   

## 2023-04-19 NOTE — Progress Notes (Signed)
PROGRESS NOTE    [name]  ZOX:[id number] DOB: [birth year] DOA: 12/07/2022 PCP: [name], NP   Brief Narrative: [name] is a 57 y.o. male with a history of heart failure, CAD, right BKA, COPD, hyperlipidemia, hyperlipidemia, polyneuropathy, schizoaffective/bipolar disorder, was admitted to the hospital with left lower extremity cellulitis and gangrene.  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    During hospitalization, patient was also noted to have maxillary abscess and periapical abscess and underwent oral surgery with I&D and of anterior maxillary abscess and surgical extraction of multiple teeth in alveoloplasty. Currently stable.  Hospital stay prolonged due to difficulty in placement due to homelessness status status post bilateral BKA.  Assessment and Plan: Sepsis secondary to cellulitis/gangrene of left foot Initially received vancomycin, Cefepime and Flagyl.  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  Previous history of right below-knee amputation.  Currently unsafe for disposition.  Patient is wheelchair-bound.  Abscess physiology has resolved at this time.  Lower extremity edema.  Patient states that he wishes to be on Lasix.  Patient is already on Lasix.  Anterior maxillary abscess Status post oral surgery with I&D of anterior maxillary abscess, surgical extraction of teeth 4,5,6,11,12,13,20,21,22,27,28,29. Simple extraction teeth #7,8,9,10,23,24,25,26, Alveoloplasty x 4 quadrants on 03/25/23 Dr [name].  Patient has completed Augmentin x 7 days.  Continue oral hygiene.  Currently on regular fluid consistency diet.  Chronic combined systolic and diastolic heart failure Compensated.  Continue Lasix, Coreg, hydralazine, Imdur. 2D echocardiogram from 08/2022 with LV ejection fraction of 40% with global hypokinesis.  Will adjust the dose of Coreg today.    Essential hypertension Accelerated  today.  Patient is on Coreg amlodipine and Imdur.  Dose of Coreg was increased from Coreg from 3.125 to 6.25 mg on 04/18/2023.  Will continue to monitor blood pressure.  Overall blood pressure has improved.  COPD Compensated. Continue Dulera BID and albuterol.    Anxiety -Continue Atarax PRN  GERD -Continue Protonix  Constipation -Continue bowel regimen with MiraLAX , Dulcolax PRN.  On narcotics  Chronic pain On MS Contin 30 mg BID   To be supervised for concerns of previous hoarding medication.  Schizoaffective disorder Seen by psychiatry during hospitalization.  Continue Abilify and Lexapro.    DVT prophylaxis: Lovenox subcu  Code Status:   Code Status: Full Code  Family Communication:  None at bedside.  Disposition Plan:  Medically stable for discharge.  Difficult disposition.  Consultants:  Orthopedic surgery Psychiatry  Procedures:  12/10/22: Left BKA Status post oral surgery with I&D of anterior maxillary abscess, surgical extraction of teeth 4,5,6,11,12,13,20,21,22,27,28,29. Simple extraction teeth #7,8,9,10,23,24,25,26, Alveoloplasty x 4 quadrants on 03/25/23 Dr [name].  Antimicrobials: None currently.  Subjective: Today, patient was seen and examined at bedside.  Wishes to be on Lasix which he is already on complains of mild leg swelling.  Blood pressure a little better since yesterday.  No other complaints  Objective: BP (!) 154/70 (BP Location: Left Arm) Comment: Nurse Lili Notified  Pulse 64   Temp 98 F (36.7 C) (Oral)   Resp 17   Ht 5\' 9"  (1.753 m)   Wt 90.8 kg   SpO2 97%   BMI 29.56 kg/m   Examination: Body mass index is 29.56 kg/m.   General:  Average built, not in obvious distress, alert awake and Communicative. HENT:   No scleral pallor or icterus noted. Oral mucosa is moist.  Status post dental extraction Chest:  Clear breath sounds noted.  No crackles or wheezes.  CVS: S1 &S2 heard. No murmur.  Regular rate and rhythm. Abdomen: Soft,  nontender, nondistended.  Bowel sounds are heard.   Extremities: Status post bilateral below-knee amputation. Psych: Alert, awake and oriented, normal mood CNS:  No cranial nerve deficits.  Moves upper extremities. Skin: Warm and dry.  Bilateral lower extremity below-knee amputation.  Data Reviewed: I have personally reviewed the following labs and imaging studies.    CBC Lab Results  Component Value Date   WBC 5.9 03/20/2023   RBC 4.94 03/20/2023   HGB 14.8 03/20/2023   HCT 43.6 03/20/2023   MCV 88.3 03/20/2023   MCH 30.0 03/20/2023   PLT 208 03/20/2023   MCHC 33.9 03/20/2023   RDW 14.8 03/20/2023   LYMPHSABS 2.1 12/27/2022   MONOABS 0.7 12/27/2022   EOSABS 0.1 12/27/2022   BASOSABS 0.1 12/27/2022     Last metabolic panel Lab Results  Component Value Date   NA 139 03/18/2023   K 3.8 03/18/2023   CL 106 03/18/2023   CO2 23 03/18/2023   BUN 15 03/18/2023   CREATININE 0.86 04/01/2023   GLUCOSE 163 (H) 03/18/2023   GFRNONAA >60 04/01/2023   GFRAA >60 08/21/2020   CALCIUM 9.3 03/18/2023   PHOS 4.7 (H) 07/18/2022   PROT 7.4 03/04/2023   ALBUMIN 3.5 03/04/2023   BILITOT 0.4 03/04/2023   ALKPHOS 62 03/04/2023   AST 17 03/04/2023   ALT 16 03/04/2023   ANIONGAP 10 03/18/2023    GFR: Estimated Creatinine Clearance: 109.3 mL/min (by C-G formula based on SCr of 0.86 mg/dL).  No results found for this or any previous visit (from the past 240 hour(s)).    Radiology Studies: No results found.    LOS: 133 days    Joycelyn Das, MD [facility] 04/19/2023, 10:06 AM If 7PM-7AM, please contact night-coverage [email or web address]

## 2023-04-19 NOTE — TOC Progression Note (Signed)
Transition of Care [facility]) - Progression Note    Patient Details  Name: [name] MRN: [id number] Date of Birth: [birth year]  Transition of [facility] CM/SW Contact  [name], [name] Phone Number: 04/19/2023, 12:25 PM  Clinical Narrative:    CSW spoke with patient at bedside. Patient requesting CSW attempt to obtain a copy of his birth certificate. Patient reports he was born in [place], [place] - CSW will attempt to contact office of vital records tomorrow to assist patient with this request.   Expected Discharge Plan: Skilled Nursing Facility Barriers to Discharge: Continued Medical Work up, SNF Pending bed offer  Expected Discharge Plan and Services   Discharge Planning Services: CM Consult Post Acute Care Choice: Skilled Nursing Facility Living arrangements for the past 2 months: Homeless                                       Social Determinants of Health (SDOH) Interventions SDOH Screenings   Food Insecurity: Food Insecurity Present (11/24/2022)  Housing: Medium Risk (12/07/2022)  Transportation Needs: Unmet Transportation Needs (12/07/2022)  Utilities: Not At Risk (12/07/2022)  Alcohol Screen: Medium Risk (06/24/2019)  Depression (PHQ2-9): Medium Risk (02/10/2021)  Tobacco Use: High Risk (03/26/2023)    Readmission Risk Interventions    12/09/2022    9:02 AM 12/01/2022    3:56 PM 08/18/2022    9:00 AM  Readmission Risk Prevention Plan  Transportation Screening Complete Complete Complete  Medication Review Oceanographer) Complete Complete Complete  PCP or Specialist appointment within 3-5 days of discharge Complete Complete Complete  HRI or Home Care Consult Complete Complete Complete  SW Recovery Care/Counseling Consult Complete Complete Complete  Palliative Care Screening Not Applicable Not Applicable Not Applicable  Skilled Nursing Facility Not Applicable Not Applicable Not Applicable  SNF Comments  n/a

## 2023-04-20 DIAGNOSIS — I96 Gangrene, not elsewhere classified: Secondary | ICD-10-CM | POA: Diagnosis not present

## 2023-04-20 DIAGNOSIS — L03116 Cellulitis of left lower limb: Secondary | ICD-10-CM | POA: Diagnosis not present

## 2023-04-20 DIAGNOSIS — L0390 Cellulitis, unspecified: Secondary | ICD-10-CM | POA: Diagnosis not present

## 2023-04-20 DIAGNOSIS — Z5900 Homelessness unspecified: Secondary | ICD-10-CM | POA: Diagnosis not present

## 2023-04-20 MED ORDER — HYDRALAZINE HCL 25 MG PO TABS
25.0000 mg | ORAL_TABLET | Freq: Three times a day (TID) | ORAL | Status: DC
  Administered 2023-04-20 – 2023-06-04 (×107): 25 mg via ORAL
  Filled 2023-04-20 (×109): qty 1

## 2023-04-20 MED ORDER — FUROSEMIDE 40 MG PO TABS
40.0000 mg | ORAL_TABLET | Freq: Once | ORAL | Status: AC
  Administered 2023-04-20: 40 mg via ORAL
  Filled 2023-04-20: qty 1

## 2023-04-20 NOTE — Progress Notes (Signed)
PROGRESS NOTE    [name]  EAV:[id number] DOB: June 21, 1966 DOA: 12/07/2022 PCP: [name], NP   Brief Narrative: [name] is a 57 y.o. male with a history of heart failure, CAD, right BKA, COPD, hyperlipidemia, hyperlipidemia, polyneuropathy, schizoaffective/bipolar disorder, was admitted to the hospital with left lower extremity cellulitis and gangrene.  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    During hospitalization, patient was also noted to have maxillary abscess and periapical abscess and underwent oral surgery with I&D and of anterior maxillary abscess and surgical extraction of multiple teeth in alveoloplasty. Currently stable.  Hospital stay prolonged due to difficulty in placement due to homelessness status status post bilateral BKA.  Assessment and Plan:  Sepsis secondary to cellulitis/gangrene of left foot Initially received vancomycin, Cefepime and Flagyl.  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  Previous history of right below-knee amputation.  Currently unsafe for disposition.  Patient is wheelchair-bound.    Lower extremity edema.  Patient states that he wants his Lasix dose to be increased.  Will get additional dose of Lasix today.  Anterior maxillary abscess Status post oral surgery with I&D of anterior maxillary abscess, surgical extraction of teeth 4,5,6,11,12,13,20,21,22,27,28,29. Simple extraction teeth #7,8,9,10,23,24,25,26, Alveoloplasty x 4 quadrants on 03/25/23 Dr [name].  Patient has completed Augmentin x 7 days.    Currently on regular fluid consistency diet.  Continue oral hygiene  Chronic combined systolic and diastolic heart failure Compensated.  Continue Lasix, Coreg, hydralazine, Imdur. 2D echocardiogram from 08/2022 with LV ejection fraction of 40% with global hypokinesis.  Dose of Coreg has been adjusted.  Will give additional 40 mEq of Lasix today.  Essential  hypertension Blood pressure is still accelerated, patient is on Coreg, amlodipine and Imdur.  Dose of Coreg was increased from Coreg from 3.125 to 6.25 mg on 04/18/2023.  Will change hydralazine to 25 3 times daily scheduled.  Will continue to monitor blood pressure.    COPD Compensated. Continue Dulera BID and albuterol.    Anxiety -Continue Atarax PRN  GERD -Continue Protonix  Constipation -Continue bowel regimen with MiraLAX , Dulcolax PRN.  On narcotics  Chronic pain On MS Contin 30 mg BID   To be supervised for concerns of previous hoarding medication.  Schizoaffective disorder Seen by psychiatry during hospitalization.  Continue Abilify and Lexapro.    DVT prophylaxis: Lovenox subcu  Code Status:   Code Status: Full Code  Family Communication:  None at bedside.  Disposition Plan:  Medically stable for discharge.  Difficult disposition.  Consultants:  Orthopedic surgery Psychiatry  Procedures:  12/10/22: Left BKA Status post oral surgery with I&D of anterior maxillary abscess, surgical extraction of teeth 4,5,6,11,12,13,20,21,22,27,28,29. Simple extraction teeth #7,8,9,10,23,24,25,26, Alveoloplasty x 4 quadrants on 03/25/23 Dr [name].  Antimicrobials: None currently.  Subjective: Today, patient was seen and examined at bedside complains of leg swelling and edema.  Blood pressure is still elevated.  Denies any chest pain, nausea, vomiting, fever, chills or rigor.   Objective: BP (!) 180/92 (BP Location: Left Arm)   Pulse 61   Temp 98.7 F (37.1 C)   Resp 17   Ht 5\' 9"  (1.753 m)   Wt 90.8 kg   SpO2 100%   BMI 29.56 kg/m   Examination: Body mass index is 29.56 kg/m.   General:  Average built, not in obvious distress, alert awake and Communicative. HENT:   No scleral pallor or icterus noted. Oral mucosa is moist.  Status post dental extraction Chest:   Diminished breath sounds bilaterally. CVS: S1 &S2 heard. No murmur.  Regular rate and rhythm. Abdomen:  Soft, nontender, nondistended.  Bowel sounds are heard.   Extremities: Status post bilateral below-knee amputation.  Amputated stump with some edema. Psych: Alert, awake and oriented, normal mood CNS:  No cranial nerve deficits.  Moves upper extremities. Skin: Warm and dry.  Bilateral lower extremity below-knee amputation.  Data Reviewed: I have personally reviewed the following labs and imaging studies.    CBC Lab Results  Component Value Date   WBC 5.9 03/20/2023   RBC 4.94 03/20/2023   HGB 14.8 03/20/2023   HCT 43.6 03/20/2023   MCV 88.3 03/20/2023   MCH 30.0 03/20/2023   PLT 208 03/20/2023   MCHC 33.9 03/20/2023   RDW 14.8 03/20/2023   LYMPHSABS 2.1 12/27/2022   MONOABS 0.7 12/27/2022   EOSABS 0.1 12/27/2022   BASOSABS 0.1 12/27/2022     Last metabolic panel Lab Results  Component Value Date   NA 139 03/18/2023   K 3.8 03/18/2023   CL 106 03/18/2023   CO2 23 03/18/2023   BUN 15 03/18/2023   CREATININE 0.86 04/01/2023   GLUCOSE 163 (H) 03/18/2023   GFRNONAA >60 04/01/2023   GFRAA >60 08/21/2020   CALCIUM 9.3 03/18/2023   PHOS 4.7 (H) 07/18/2022   PROT 7.4 03/04/2023   ALBUMIN 3.5 03/04/2023   BILITOT 0.4 03/04/2023   ALKPHOS 62 03/04/2023   AST 17 03/04/2023   ALT 16 03/04/2023   ANIONGAP 10 03/18/2023    GFR: Estimated Creatinine Clearance: 109.3 mL/min (by C-G formula based on SCr of 0.86 mg/dL).  No results found for this or any previous visit (from the past 240 hour(s)).    Radiology Studies: No results found.    LOS: 134 days    Joycelyn Das, MD [facility] 04/20/2023, 11:06 AM If 7PM-7AM, please contact night-coverage [email or web address]

## 2023-04-20 NOTE — TOC Progression Note (Addendum)
Transition of Care [facility]) - Progression Note    Patient Details  Name: [name] MRN: [id number] Date of Birth: [birth year]  [facility] CM/SW Contact  [name], [name] Phone Number: 04/20/2023, 10:59 AM  Clinical Narrative:    CSW spoke with patient to obtain information needed to submit a request for a new birth certificate.   CSW spoke with staff at [facility] who states she will email information to CSW to assist with this request.  CSW submitted written request to [facility] to request a new birth certificate.  Expected Discharge Plan: Skilled Nursing Facility Barriers to Discharge: Continued Medical Work up, SNF Pending bed offer  Expected Discharge Plan and Services   Discharge Planning Services: CM Consult Post Acute Care Choice: Skilled Nursing Facility Living arrangements for the past 2 months: Homeless                                       Social Determinants of Health (SDOH) Interventions SDOH Screenings   Food Insecurity: Food Insecurity Present (11/24/2022)  Housing: Medium Risk (12/07/2022)  Transportation Needs: Unmet Transportation Needs (12/07/2022)  Utilities: Not At Risk (12/07/2022)  Alcohol Screen: Medium Risk (06/24/2019)  Depression (PHQ2-9): Medium Risk (02/10/2021)  Tobacco Use: High Risk (03/26/2023)    Readmission Risk Interventions    12/09/2022    9:02 AM 12/01/2022    3:56 PM 08/18/2022    9:00 AM  Readmission Risk Prevention Plan  Transportation Screening Complete Complete Complete  Medication Review Oceanographer) Complete Complete Complete  PCP or Specialist appointment within 3-5 days of discharge Complete Complete Complete  HRI or Home Care Consult Complete Complete Complete  SW Recovery Care/Counseling Consult Complete Complete Complete  Palliative Care Screening Not Applicable Not Applicable Not Applicable  Skilled Nursing Facility Not Applicable Not Applicable Not Applicable  SNF  Comments  n/a

## 2023-04-20 NOTE — Plan of Care (Signed)
  Problem: Pain Managment: Goal: General experience of comfort will improve Outcome: Progressing   Problem: Safety: Goal: Ability to remain free from injury will improve Outcome: Progressing   Problem: Skin Integrity: Goal: Risk for impaired skin integrity will decrease Outcome: Progressing   Problem: Activity: Goal: Risk for activity intolerance will decrease Outcome: Progressing   

## 2023-04-20 NOTE — Progress Notes (Signed)
Mobility Specialist Progress Note   04/20/23 1000  Mobility  Activity Transferred from bed to chair;Transferred from chair to bed (LE exercise)  Level of Assistance Modified independent, requires aide device or extra time  Assistive Device Wheelchair  Range of Motion/Exercises Active;All extremities  RLE Weight Bearing NWB  LLE Weight Bearing NWB  Activity Response Tolerated well   Patient received in [facility], eager to participate. Performed BLE exercise and AAROM with focus on hip flexion and extension. Also completed anterior/posterior transfer with mod I.  Tolerated without complaint or incident. Was left in [facility] with all needs met, call bell in reach.   [place] Jerick Khachatryan, BS EXP Mobility Specialist Please contact via SecureChat or Rehab office at [phone]

## 2023-04-20 NOTE — Plan of Care (Signed)
  Problem: Pain Managment: Goal: General experience of comfort will improve Outcome: Adequate for Discharge   Problem: Safety: Goal: Ability to remain free from injury will improve Outcome: Adequate for Discharge   Problem: Skin Integrity: Goal: Risk for impaired skin integrity will decrease Outcome: Adequate for Discharge   

## 2023-04-21 DIAGNOSIS — Z5900 Homelessness unspecified: Secondary | ICD-10-CM | POA: Diagnosis not present

## 2023-04-21 DIAGNOSIS — I96 Gangrene, not elsewhere classified: Secondary | ICD-10-CM | POA: Diagnosis not present

## 2023-04-21 DIAGNOSIS — L03116 Cellulitis of left lower limb: Secondary | ICD-10-CM | POA: Diagnosis not present

## 2023-04-21 DIAGNOSIS — L0390 Cellulitis, unspecified: Secondary | ICD-10-CM | POA: Diagnosis not present

## 2023-04-21 MED ORDER — FUROSEMIDE 40 MG PO TABS
40.0000 mg | ORAL_TABLET | Freq: Once | ORAL | Status: AC
  Administered 2023-04-21: 40 mg via ORAL
  Filled 2023-04-21: qty 1

## 2023-04-21 NOTE — Plan of Care (Signed)
Physical Therapy Note  Participating well, and progressing towards goals;   Wheelchair transfer goal met;   Will continue work on advanced transfers (including floor<>wheelchair transfer), and strengthening and stretching program;   Van Clines, PT  Acute Rehabilitation Services Office [phone] Secure Chat welcomed

## 2023-04-21 NOTE — Progress Notes (Signed)
Physical Therapy Treatment Patient Details Name: [name] MRN: [id number] DOB: [birth year] Today's Date: 04/21/2023   History of Present Illness Pt is a 57 y/o M who presented 12/17/22 with L ankle cellulitis and underwent L BKA on 1/10. Oral abscesses noted, underwent oral surgery Wed, 4/24; PMH includes asthma, bioplar 1, depression, schizoaffective disorder, CVA, chronic CHF, homelessness, HTN, DM2, cocaine and cannabis use, and R knee disarticulation    PT Comments    Continuing work on functional mobility and activity tolerance;  Session focused on floor transfer by performing components of pushing self up from mat table to a stool placed behind him x 2 reps; Very nice progress from the last time he worked on this activity; We discussed ideas for progression, and will plan to get down to floor mat, then back onto a step stool, then from stool to mat table at its lowest height; will consider working on car transfers as well (with or without a sliding board) if that is meaningful to [name]   Recommendations for follow up therapy are one component of a multi-disciplinary discharge planning process, led by the attending physician.  Recommendations may be updated based on patient status, additional functional criteria and insurance authorization.  Follow Up Recommendations  HHPT/OT to help with transition to apartment; after [facility] course, rec Outpt PT for prosthesis prep and training   Can patient physically be transported by private vehicle:  (if car transfer is meaningful to pt, can work with sliding board on that)    Assistance Recommended at Discharge PRN  Patient can return home with the following Assist for transportation   Equipment Recommendations   (Tub bench)    Recommendations for Other Services Other (comment) (Recommend starting the Mental/Behavioral Health support through [name] to start while [name] is here in the hospital)     Precautions / Restrictions  Precautions Precautions: None Precaution Comments: R knee disrticulation, L BKA; Fall risk is present, but minimal Restrictions RLE Weight Bearing: Weight bearing as tolerated LLE Weight Bearing: Weight bearing as tolerated     Mobility  Bed Mobility                    Transfers Overall transfer level: Modified independent   Transfers: Bed to chair/wheelchair/BSC         Anterior-Posterior transfers: Modified independent (Device/Increase time)   General transfer comment: From wc <> mat table; [name] employed the anterior posterior technique, but with the wheelchair parked sideways by the mat table, making in necessary for him to push his body up over the wheel; in doing so today, he pushed up for the middle of the back of teh wheelchair to get to the mat table; showed better insight into rationale for not placing hand on wheelchair back during transfers (to keep the back material from coming apart from the metal uprights), and transferred anterior posterior back to the wheelchair with more optimal positioning    Ambulation/Gait                   Stairs             Wheelchair Mobility    Modified Rankin (Stroke Patients Only)       Balance     Sitting balance-Leahy Scale: Normal                                      Cognition Arousal/Alertness:  Awake/alert Behavior During Therapy: WFL for tasks assessed/performed Overall Cognitive Status: Within Functional Limits for tasks assessed                                 General Comments: When [name] understands rationale for an exercise or activity, and that rationale is in line with his goals, he participates much better; showed better insight into rationale for not placing hand on wheelchair back during transfers (to keep the back material from coming apart from the metal uprights), and transferred anterior posterior with more optimal positioning        Exercises       General Comments General comments (skin integrity, edema, etc.): worked on components of floor transfers, with posterior pressups from mat table to a step stool behind him x2; smoothe rise a elevation of core to allow for placing hips gently on stool; ***[name] mentioned that [name] from [place] contacted him about an apartment off [address]; he seemed encouraged by this      Pertinent Vitals/Pain Pain Assessment Pain Assessment: Faces Faces Pain Scale: Hurts a little bit Pain Location: end of R limb; Grimacing can also be attributed to incr effort Pain Descriptors / Indicators: Grimacing Pain Intervention(s): Monitored during session    Home Living                          Prior Function            PT Goals (current goals can now be found in the care plan section) Acute Rehab PT Goals Patient Stated Goal: Prepare for prosthetics fitting; Would like to get housing in [employer] through the Transition to [employer] PT Goal Formulation: With patient Time For Goal Achievement: 05/19/23 Potential to Achieve Goals: Good Additional Goals Additional Goal #1: Pt will transfers from seated in WC to floor, and tehn from floor back to [facility] modified independently Progress towards PT goals: Progressing toward goals (Goals Updated; will consider adding a car transfer goal)    Frequency    Min 1X/week      PT Plan Current plan remains appropriate    Co-evaluation              AM-PAC PT "6 Clicks" Mobility   Outcome Measure  Help needed turning from your back to your side while in a flat bed without using bedrails?: None Help needed moving from lying on your back to sitting on the side of a flat bed without using bedrails?: None Help needed moving to and from a bed to a chair (including a wheelchair)?: None Help needed standing up from a chair using your arms (e.g., wheelchair or bedside chair)?: Total Help needed to walk in hospital room?:  Total Help needed climbing 3-5 steps with a railing? : Total 6 Click Score: 15    End of Session   Activity Tolerance: Patient tolerated treatment well Patient left: Other (comment) (back in wheelchair) Nurse Communication: Other (comment) (Good progress and participation) PT Visit Diagnosis: Muscle weakness (generalized) (M62.81);Other abnormalities of gait and mobility (R26.89)     Time: 1020-1054 PT Time Calculation (min) (ACUTE ONLY): 34 min  Charges:  $Therapeutic Activity: 23-37 mins                     Van Clines, PT  Acute Rehabilitation Services Office [phone] Secure Chat welcomed    [name]  [name] 04/21/2023, 6:57 PM

## 2023-04-21 NOTE — Plan of Care (Signed)
  Problem: Pain Managment: Goal: General experience of comfort will improve Outcome: Progressing   Problem: Safety: Goal: Ability to remain free from injury will improve Outcome: Progressing   Problem: Skin Integrity: Goal: Risk for impaired skin integrity will decrease Outcome: Progressing   

## 2023-04-21 NOTE — Progress Notes (Signed)
PROGRESS NOTE    [name]  ZOX:[id number] DOB: 10/23/1966 DOA: 12/07/2022 PCP: [name], NP   Brief Narrative: [name] is a 57 y.o. male with a history of heart failure, CAD, right BKA, COPD, hyperlipidemia, hyperlipidemia, polyneuropathy, schizoaffective/bipolar disorder, was admitted to the hospital with left lower extremity cellulitis and gangrene.  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    During hospitalization, patient was also noted to have maxillary abscess and periapical abscess and underwent oral surgery with I&D and of anterior maxillary abscess and surgical extraction of multiple teeth in alveoloplasty. Currently stable.  Hospital stay prolonged due to difficulty in placement due to homelessness status status post bilateral BKA.  Assessment and Plan:  Sepsis secondary to cellulitis/gangrene of left foot Initially received vancomycin, Cefepime and Flagyl.  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  Previous history of right below-knee amputation.  Currently unsafe for disposition.  Patient is wheelchair-bound.    Lower extremity edema.  On oral Lasix.  Anterior maxillary abscess Status post oral surgery with I&D of anterior maxillary abscess, surgical extraction of teeth 4,5,6,11,12,13,20,21,22,27,28,29. Simple extraction teeth #7,8,9,10,23,24,25,26, Alveoloplasty x 4 quadrants on 03/25/23 Dr [name].  Patient has completed Augmentin x 7 days.    Currently on regular fluid consistency diet.  Continue oral hygiene  Chronic combined systolic and diastolic heart failure Compensated.  Continue Lasix, Coreg, hydralazine, Imdur. 2D echocardiogram from 08/2022 with LV ejection fraction of 40% with global hypokinesis.  Dose of Coreg has been adjusted.    Essential hypertension on Coreg, amlodipine and Imdur.  Dose of Coreg was increased from Coreg from 3.125 to 6.25 mg on 04/18/2023.  Hydralazine 25  mg 3 times daily has been added to the regimen and blood pressure seems to be better today.   COPD Compensated. Continue Dulera BID and albuterol.    Anxiety -Continue Atarax PRN  GERD -Continue Protonix  Constipation -Continue bowel regimen with MiraLAX , Dulcolax PRN.  On narcotics  Chronic pain On MS Contin 30 mg BID   To be supervised for concerns of previous hoarding medication.  Schizoaffective disorder Seen by psychiatry during hospitalization.  Continue Abilify and Lexapro.    DVT prophylaxis: Lovenox subcu  Code Status:   Code Status: Full Code  Family Communication:  None at bedside.  Disposition Plan:  Medically stable for discharge.  Difficult disposition.  Consultants:  Orthopedic surgery Psychiatry  Procedures:  12/10/22: Left BKA Status post oral surgery with I&D of anterior maxillary abscess, surgical extraction of teeth 4,5,6,11,12,13,20,21,22,27,28,29. Simple extraction teeth #7,8,9,10,23,24,25,26, Alveoloplasty x 4 quadrants on 03/25/23 Dr [name].  Antimicrobials: None currently.  Subjective: Today, patient was seen and examined at bedside.  He states that he is swelling is present at this time.  Has not been urinating much.  Denies any shortness of breath.  States that he does not want his Atarax.  Objective: BP 115/65 (BP Location: Left Arm)   Pulse 67   Temp 98.1 F (36.7 C) (Oral)   Resp 18   Ht 5\' 9"  (1.753 m)   Wt 90.8 kg   SpO2 100%   BMI 29.56 kg/m   Examination: Body mass index is 29.56 kg/m.   General: Alert awake and Communicative, not in obvious distress.  On wheelchair. HENT:   No scleral pallor or icterus noted. Oral mucosa is moist.  Status post dental extraction Chest:   Diminished breath sounds bilaterally. CVS: S1 &S2 heard. No murmur.  Regular rate and rhythm. Abdomen: Soft, nontender, nondistended.  Bowel sounds are heard.   Extremities: Status post bilateral below-knee amputation.  Amputated stump with some  edema. Psych: Alert, awake and oriented, normal mood CNS:  No cranial nerve deficits.  Moves upper extremities. Skin: Warm and dry.  Bilateral lower extremity below-knee amputation.  Data Reviewed: I have personally reviewed the following labs and imaging studies.    CBC Lab Results  Component Value Date   WBC 5.9 03/20/2023   RBC 4.94 03/20/2023   HGB 14.8 03/20/2023   HCT 43.6 03/20/2023   MCV 88.3 03/20/2023   MCH 30.0 03/20/2023   PLT 208 03/20/2023   MCHC 33.9 03/20/2023   RDW 14.8 03/20/2023   LYMPHSABS 2.1 12/27/2022   MONOABS 0.7 12/27/2022   EOSABS 0.1 12/27/2022   BASOSABS 0.1 12/27/2022     Last metabolic panel Lab Results  Component Value Date   NA 139 03/18/2023   K 3.8 03/18/2023   CL 106 03/18/2023   CO2 23 03/18/2023   BUN 15 03/18/2023   CREATININE 0.86 04/01/2023   GLUCOSE 163 (H) 03/18/2023   GFRNONAA >60 04/01/2023   GFRAA >60 08/21/2020   CALCIUM 9.3 03/18/2023   PHOS 4.7 (H) 07/18/2022   PROT 7.4 03/04/2023   ALBUMIN 3.5 03/04/2023   BILITOT 0.4 03/04/2023   ALKPHOS 62 03/04/2023   AST 17 03/04/2023   ALT 16 03/04/2023   ANIONGAP 10 03/18/2023    GFR: Estimated Creatinine Clearance: 109.3 mL/min (by C-G formula based on SCr of 0.86 mg/dL).  No results found for this or any previous visit (from the past 240 hour(s)).    Radiology Studies: No results found.    LOS: 135 days    Joycelyn Das, MD [facility] 04/21/2023, 10:04 AM If 7PM-7AM, please contact night-coverage [email or web address]

## 2023-04-22 DIAGNOSIS — L0390 Cellulitis, unspecified: Secondary | ICD-10-CM | POA: Diagnosis not present

## 2023-04-22 DIAGNOSIS — L03116 Cellulitis of left lower limb: Secondary | ICD-10-CM | POA: Diagnosis not present

## 2023-04-22 DIAGNOSIS — I96 Gangrene, not elsewhere classified: Secondary | ICD-10-CM | POA: Diagnosis not present

## 2023-04-22 DIAGNOSIS — Z5900 Homelessness unspecified: Secondary | ICD-10-CM | POA: Diagnosis not present

## 2023-04-22 NOTE — Progress Notes (Signed)
PROGRESS NOTE    [name]  UEA:[id number] DOB: 17-Oct-1966 DOA: 12/07/2022 PCP: [name], NP   Brief Narrative: [name] is a 57 y.o. male with a history of heart failure, CAD, right BKA, COPD, hyperlipidemia, hyperlipidemia, polyneuropathy, schizoaffective/bipolar disorder, was admitted to the hospital with left lower extremity cellulitis and gangrene.  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    During hospitalization, patient was also noted to have maxillary abscess and periapical abscess and underwent oral surgery with I&D and of anterior maxillary abscess and surgical extraction of multiple teeth in alveoloplasty. Currently stable.  Hospital stay prolonged due to difficulty in placement due to homelessness status status post bilateral BKA.  Assessment and Plan:  Sepsis secondary to cellulitis/gangrene of left foot Initially received vancomycin, Cefepime and Flagyl.  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  Previous history of right below-knee amputation.  Currently unsafe for disposition.  Patient is wheelchair-bound.    Lower extremity edema.  On oral Lasix.  Will continue.  Anterior maxillary abscess Status post oral surgery with I&D of anterior maxillary abscess, surgical extraction of teeth 4,5,6,11,12,13,20,21,22,27,28,29. Simple extraction teeth #7,8,9,10,23,24,25,26, Alveoloplasty x 4 quadrants on 03/25/23 Dr [name].  Patient has completed Augmentin x 7 days.    Currently on regular fluid consistency diet.  Continue oral hygiene  Chronic combined systolic and diastolic heart failure Compensated.  Continue Lasix, Coreg, hydralazine, Imdur. 2D echocardiogram from 08/2022 with LV ejection fraction of 40% with global hypokinesis.  Dose of Coreg has been adjusted.    Essential hypertension on Coreg, amlodipine and Imdur.  Dose of Coreg was increased from Coreg from 3.125 to 6.25 mg on 04/18/2023.   Hydralazine 25 mg 3 times daily has been added to the regimen.  Blood pressure better today COPD Compensated. Continue Dulera BID and albuterol.    Anxiety -Continue Atarax PRN  GERD -Continue Protonix  Constipation -Continue bowel regimen with MiraLAX , Dulcolax PRN.  On narcotics  Chronic pain On MS Contin 30 mg BID   To be supervised for concerns of previous hoarding medication.  Schizoaffective disorder Seen by psychiatry during hospitalization.  Continue Abilify and Lexapro.    DVT prophylaxis: Lovenox subcu  Code Status:   Code Status: Full Code  Family Communication:  None at bedside.  Disposition Plan:  Medically stable for discharge.  Difficult disposition.  Consultants:  Orthopedic surgery Psychiatry  Procedures:  12/10/22: Left BKA Status post oral surgery with I&D of anterior maxillary abscess, surgical extraction of teeth 4,5,6,11,12,13,20,21,22,27,28,29. Simple extraction teeth #7,8,9,10,23,24,25,26, Alveoloplasty x 4 quadrants on 03/25/23 Dr [name].  Antimicrobials: None currently.  Subjective: Today, patient was seen and examined at bedside.  Denies any nausea vomiting fever chills diarrhea.  States that he has been urinating better and swelling has gone down.  Blood pressure has improved.    Objective: BP (!) 132/94 (BP Location: Left Arm)   Pulse 77   Temp 98.1 F (36.7 C)   Resp 18   Ht 5\' 9"  (1.753 m)   Wt 90.8 kg   SpO2 99%   BMI 29.56 kg/m   Examination: Body mass index is 29.56 kg/m.   General: Alert awake and oriented, not in obvious distress.  Lying in bed. HENT:   No scleral pallor or icterus noted. Oral mucosa is moist.  Status post dental extraction Chest:   Diminished breath sounds bilaterally. CVS: S1 &S2 heard. No murmur.  Regular rate and rhythm. Abdomen: Soft,  nontender, nondistended.  Bowel sounds are heard.   Extremities: Status post bilateral below-knee amputation. Psych: Alert, awake and oriented, normal mood CNS:  No  cranial nerve deficits.  Moves upper extremities. Skin: Warm and dry.  Bilateral lower extremity below-knee amputation.  Data Reviewed: I have personally reviewed the following labs and imaging studies.    CBC Lab Results  Component Value Date   WBC 5.9 03/20/2023   RBC 4.94 03/20/2023   HGB 14.8 03/20/2023   HCT 43.6 03/20/2023   MCV 88.3 03/20/2023   MCH 30.0 03/20/2023   PLT 208 03/20/2023   MCHC 33.9 03/20/2023   RDW 14.8 03/20/2023   LYMPHSABS 2.1 12/27/2022   MONOABS 0.7 12/27/2022   EOSABS 0.1 12/27/2022   BASOSABS 0.1 12/27/2022     Last metabolic panel Lab Results  Component Value Date   NA 139 03/18/2023   K 3.8 03/18/2023   CL 106 03/18/2023   CO2 23 03/18/2023   BUN 15 03/18/2023   CREATININE 0.86 04/01/2023   GLUCOSE 163 (H) 03/18/2023   GFRNONAA >60 04/01/2023   GFRAA >60 08/21/2020   CALCIUM 9.3 03/18/2023   PHOS 4.7 (H) 07/18/2022   PROT 7.4 03/04/2023   ALBUMIN 3.5 03/04/2023   BILITOT 0.4 03/04/2023   ALKPHOS 62 03/04/2023   AST 17 03/04/2023   ALT 16 03/04/2023   ANIONGAP 10 03/18/2023    GFR: CrCl cannot be calculated (Patient's most recent lab result is older than the maximum 21 days allowed.).  No results found for this or any previous visit (from the past 240 hour(s)).    Radiology Studies: No results found.    LOS: 136 days    Joycelyn Das, MD [facility] 04/22/2023, 11:08 AM If 7PM-7AM, please contact night-coverage [email or web address]

## 2023-04-22 NOTE — Plan of Care (Signed)
  Problem: Pain Managment: Goal: General experience of comfort will improve Outcome: Adequate for Discharge   Problem: Safety: Goal: Ability to remain free from injury will improve Outcome: Adequate for Discharge   Problem: Skin Integrity: Goal: Risk for impaired skin integrity will decrease Outcome: Adequate for Discharge   

## 2023-04-23 DIAGNOSIS — Z5900 Homelessness unspecified: Secondary | ICD-10-CM | POA: Diagnosis not present

## 2023-04-23 DIAGNOSIS — L03116 Cellulitis of left lower limb: Secondary | ICD-10-CM | POA: Diagnosis not present

## 2023-04-23 DIAGNOSIS — I96 Gangrene, not elsewhere classified: Secondary | ICD-10-CM | POA: Diagnosis not present

## 2023-04-23 DIAGNOSIS — L0390 Cellulitis, unspecified: Secondary | ICD-10-CM | POA: Diagnosis not present

## 2023-04-23 NOTE — Progress Notes (Signed)
Mobility Specialist Progress Note   04/23/23 1500  Mobility  Activity Ambulated independently in hallway (Propelled + UE exercises)  Level of Assistance Independent  Assistive Device Wheelchair  Distance Ambulated (ft) 1100 ft  Range of Motion/Exercises Active;All extremities  Activity Response Tolerated well   Patient received in [facility] and agreeable to participate. Propelled long distance in hallway and completed x4 interval UE exercises. Returned to room without complaint or incident. Was left with all needs met.  [place] Mavi Un, BS EXP Mobility Specialist Please contact via SecureChat or Rehab office at [phone]

## 2023-04-23 NOTE — Plan of Care (Signed)
  Problem: Pain Managment: Goal: General experience of comfort will improve Outcome: Adequate for Discharge   Problem: Safety: Goal: Ability to remain free from injury will improve Outcome: Adequate for Discharge   Problem: Skin Integrity: Goal: Risk for impaired skin integrity will decrease Outcome: Adequate for Discharge   

## 2023-04-23 NOTE — Progress Notes (Signed)
PROGRESS NOTE    [name]  WUJ:[id number] DOB: 15-Nov-1966 DOA: 12/07/2022 PCP: [name], NP   Brief Narrative: [name] is a 57 y.o. male with a history of heart failure, CAD, right BKA, COPD, hyperlipidemia, hyperlipidemia, polyneuropathy, schizoaffective/bipolar disorder, was admitted to the hospital with left lower extremity cellulitis and gangrene.  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    During hospitalization, patient was also noted to have maxillary abscess and periapical abscess and underwent oral surgery with I&D and of anterior maxillary abscess and surgical extraction of multiple teeth in alveoloplasty. Currently stable.  Hospital stay prolonged due to difficulty in placement due to homelessness status status post bilateral BKA.  Assessment and Plan:  Sepsis secondary to cellulitis/gangrene of left foot Initially received vancomycin, Cefepime and Flagyl.  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  Previous history of right below-knee amputation.  Currently unsafe for disposition.  Patient is wheelchair-bound.    Lower extremity edema.  On oral Lasix.  Will continue.  Anterior maxillary abscess Status post oral surgery with I&D of anterior maxillary abscess, surgical extraction of teeth 4,5,6,11,12,13,20,21,22,27,28,29. Simple extraction teeth #7,8,9,10,23,24,25,26, Alveoloplasty x 4 quadrants on 03/25/23 Dr [name].  Patient has completed Augmentin x 7 days.    Currently on regular fluid consistency diet.  Continue oral hygiene  Chronic combined systolic and diastolic heart failure Compensated.  Continue Lasix, Coreg, hydralazine, Imdur. 2D echocardiogram from 08/2022 with LV ejection fraction of 40% with global hypokinesis.    Essential hypertension on Coreg, amlodipine and Imdur.  Dose of Coreg was increased from Coreg from 3.125 to 6.25 mg on 04/18/2023.  Hydralazine 25 mg 3 times daily  has been added to the regimen.  Blood pressure overall improving.  Latest blood pressure of 136/88  COPD Compensated. Continue Dulera BID and albuterol.    Anxiety -Continue Atarax PRN  GERD -Continue Protonix  Constipation -Continue bowel regimen with MiraLAX , Dulcolax PRN.  On narcotics  Chronic pain On MS Contin 30 mg BID   To be supervised for concerns of previous hoarding medication.  Schizoaffective disorder Seen by psychiatry during hospitalization.  Continue Abilify and Lexapro.    DVT prophylaxis: Lovenox subcu  Code Status:   Code Status: Full Code  Family Communication:  None at bedside.  Disposition Plan:  Medically stable for discharge.  Difficult disposition.  Consultants:  Orthopedic surgery Psychiatry  Procedures:  12/10/22: Left BKA Status post oral surgery with I&D of anterior maxillary abscess, surgical extraction of teeth 4,5,6,11,12,13,20,21,22,27,28,29. Simple extraction teeth #7,8,9,10,23,24,25,26, Alveoloplasty x 4 quadrants on 03/25/23 Dr [name].  Antimicrobials: None currently.  Subjective: Today, patient was seen and examined at bedside.  Denies interval complaints.  Wishes to be on nebulizer rather than inhaler   Objective: BP 136/88 (BP Location: Left Arm)   Pulse 89   Temp 98.2 F (36.8 C) (Oral)   Resp 17   Ht 5\' 9"  (1.753 m)   Wt 90.8 kg   SpO2 98%   BMI 29.56 kg/m   Examination: Body mass index is 29.56 kg/m.   General: Alert awake and oriented, not in obvious distress.  On a wheelchair. HENT:   No scleral pallor or icterus noted. Oral mucosa is moist.  Status post dental extraction Chest:   Diminished breath sounds bilaterally.  No wheezes or crackles. CVS: S1 &S2 heard. No murmur.  Regular rate and rhythm. Abdomen: Soft, nontender, nondistended.  Bowel sounds are heard.  Extremities: Status post bilateral below-knee amputation. Psych: Alert, awake and oriented, normal mood CNS:  No cranial nerve deficits.  Moves upper  extremities. Skin: Warm and dry.  Bilateral lower extremity below-knee amputation.  Data Reviewed: I have personally reviewed the following labs and imaging studies.    CBC Lab Results  Component Value Date   WBC 5.9 03/20/2023   RBC 4.94 03/20/2023   HGB 14.8 03/20/2023   HCT 43.6 03/20/2023   MCV 88.3 03/20/2023   MCH 30.0 03/20/2023   PLT 208 03/20/2023   MCHC 33.9 03/20/2023   RDW 14.8 03/20/2023   LYMPHSABS 2.1 12/27/2022   MONOABS 0.7 12/27/2022   EOSABS 0.1 12/27/2022   BASOSABS 0.1 12/27/2022     Last metabolic panel Lab Results  Component Value Date   NA 139 03/18/2023   K 3.8 03/18/2023   CL 106 03/18/2023   CO2 23 03/18/2023   BUN 15 03/18/2023   CREATININE 0.86 04/01/2023   GLUCOSE 163 (H) 03/18/2023   GFRNONAA >60 04/01/2023   GFRAA >60 08/21/2020   CALCIUM 9.3 03/18/2023   PHOS 4.7 (H) 07/18/2022   PROT 7.4 03/04/2023   ALBUMIN 3.5 03/04/2023   BILITOT 0.4 03/04/2023   ALKPHOS 62 03/04/2023   AST 17 03/04/2023   ALT 16 03/04/2023   ANIONGAP 10 03/18/2023    GFR: CrCl cannot be calculated (Patient's most recent lab result is older than the maximum 21 days allowed.).  No results found for this or any previous visit (from the past 240 hour(s)).    Radiology Studies: No results found.    LOS: 137 days    Joycelyn Das, MD [facility] 04/23/2023, 10:24 AM If 7PM-7AM, please contact night-coverage [email or web address]

## 2023-04-24 DIAGNOSIS — Z5900 Homelessness unspecified: Secondary | ICD-10-CM | POA: Diagnosis not present

## 2023-04-24 DIAGNOSIS — L0390 Cellulitis, unspecified: Secondary | ICD-10-CM | POA: Diagnosis not present

## 2023-04-24 DIAGNOSIS — I96 Gangrene, not elsewhere classified: Secondary | ICD-10-CM | POA: Diagnosis not present

## 2023-04-24 DIAGNOSIS — L03116 Cellulitis of left lower limb: Secondary | ICD-10-CM | POA: Diagnosis not present

## 2023-04-24 NOTE — Progress Notes (Signed)
Mobility Specialist Progress Note   04/24/23 1539  Mobility  Activity Transferred from bed to chair;Transferred from chair to bed (UE & Trunk exercise)  Level of Assistance Independent  Assistive Device Wheelchair (HHA)  Range of Motion/Exercises Active;All extremities  Activity Response Tolerated well   Patient received in WC in two separate sessions. First session, completed anterior posterior transfers x5 independently and requested to return later. Second session completed UE and trunk exercises on mat table. Completed UE exercise independently and required min HHA for trunk exercises. Tolerated without complaint or incident. Was left in [facility] with all needs met.  [place] Kyion Gautier, BS EXP Mobility Specialist Please contact via SecureChat or Rehab office at [phone]

## 2023-04-24 NOTE — Progress Notes (Signed)
Mobility Specialist Progress Note:   04/24/23 1315  Mobility  Activity  (self-propelled in w/c)  Level of Assistance Modified independent, requires aide device or extra time  Assistive Device Wheelchair  Distance Ambulated (ft) 500 ft  Activity Response Tolerated well  Mobility Referral Yes  $Mobility charge 1 Mobility  Mobility Specialist Start Time (ACUTE ONLY) 1315  Mobility Specialist Stop Time (ACUTE ONLY) 1400  Mobility Specialist Time Calculation (min) (ACUTE ONLY) 45 min   Pt eager for mobility session outside. Able to self-propel with no assistance. Back in room with all needs met.  [name] Mobility Specialist Please contact via SecureChat or  Rehab office at [phone]

## 2023-04-24 NOTE — TOC Progression Note (Signed)
Transition of Care [facility]) - Progression Note    Patient Details  Name: Nekko Coslow MRN: [id number] Date of Birth: Jul 11, 1966  Transition of Care [facility]) CM/SW Contact  [name], [name] Phone Number: 04/24/2023, 8:40 AM  Clinical Narrative:    CSW spoke with patient on unit. Patient requesting CSW refax information to [employer] to support the activation of his debit card.   CSW sent fax to [employer] on patient's behalf.   Expected Discharge Plan: Skilled Nursing Facility Barriers to Discharge: Continued Medical Work up, SNF Pending bed offer  Expected Discharge Plan and Services   Discharge Planning Services: CM Consult Post Acute Care Choice: Skilled Nursing Facility Living arrangements for the past 2 months: Homeless                                       Social Determinants of Health (SDOH) Interventions SDOH Screenings   Food Insecurity: Food Insecurity Present (11/24/2022)  Housing: Medium Risk (12/07/2022)  Transportation Needs: Unmet Transportation Needs (12/07/2022)  Utilities: Not At Risk (12/07/2022)  Alcohol Screen: Medium Risk (06/24/2019)  Depression (PHQ2-9): Medium Risk (02/10/2021)  Tobacco Use: High Risk (03/26/2023)    Readmission Risk Interventions    12/09/2022    9:02 AM 12/01/2022    3:56 PM 08/18/2022    9:00 AM  Readmission Risk Prevention Plan  Transportation Screening Complete Complete Complete  Medication Review Oceanographer) Complete Complete Complete  PCP or Specialist appointment within 3-5 days of discharge Complete Complete Complete  HRI or Home Care Consult Complete Complete Complete  SW Recovery Care/Counseling Consult Complete Complete Complete  Palliative Care Screening Not Applicable Not Applicable Not Applicable  Skilled Nursing Facility Not Applicable Not Applicable Not Applicable  SNF Comments  n/a

## 2023-04-24 NOTE — Progress Notes (Signed)
PROGRESS NOTE    [name]  ZOX:[id number] DOB: 03/03/1966 DOA: 12/07/2022 PCP: [name], NP   Brief Narrative: [name] is a 57 y.o. male with a history of heart failure, CAD, right BKA, COPD, hyperlipidemia, hyperlipidemia, polyneuropathy, schizoaffective/bipolar disorder, was admitted to the hospital with left lower extremity cellulitis and gangrene.  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    During hospitalization, patient was also noted to have maxillary abscess and periapical abscess and underwent oral surgery with I&D and of anterior maxillary abscess and surgical extraction of multiple teeth in alveoloplasty. Currently stable.  Hospital stay prolonged due to difficulty in placement due to homelessness status status post bilateral BKA.  Assessment and Plan:  Sepsis secondary to cellulitis/gangrene of left foot Initially received vancomycin, Cefepime and Flagyl.  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  Previous history of right below-knee amputation.    Patient is wheelchair-bound.    Lower extremity edema.  On oral Lasix.  Will continue.  Anterior maxillary abscess Status post oral surgery with I&D of anterior maxillary abscess, surgical extraction of teeth 4,5,6,11,12,13,20,21,22,27,28,29. Simple extraction teeth #7,8,9,10,23,24,25,26, Alveoloplasty x 4 quadrants on 03/25/23 Dr [name].  Patient has completed Augmentin x 7 days.    Currently on regular fluid consistency diet.  Continue oral hygiene  Chronic combined systolic and diastolic heart failure Compensated.  Continue Lasix, Coreg, hydralazine, Imdur. 2D echocardiogram from 08/2022 with LV ejection fraction of 40% with global hypokinesis.    Essential hypertension on Coreg, amlodipine and Imdur.  Dose of Coreg was increased from Coreg from 3.125 to 6.25 mg on 04/18/2023.  Hydralazine 25 mg 3 times daily has been added to the regimen.   Blood pressure overall improving.  Latest blood pressure of 129/75  COPD Compensated. Continue Dulera BID and albuterol.    Anxiety -Continue Atarax PRN  GERD -Continue Protonix  Constipation -Continue bowel regimen with MiraLAX , Dulcolax PRN.  On narcotics  Chronic pain On MS Contin 30 mg BID   To be supervised for concerns of previous hoarding medication.  Schizoaffective disorder Seen by psychiatry during hospitalization.  Continue Abilify and Lexapro.    DVT prophylaxis: Lovenox subcu  Code Status:   Code Status: Full Code  Family Communication:  None at bedside.  Disposition Plan:  Medically stable for discharge.  Difficult disposition.  Consultants:  Orthopedic surgery Psychiatry  Procedures:  12/10/22: Left BKA Status post oral surgery with I&D of anterior maxillary abscess, surgical extraction of teeth 4,5,6,11,12,13,20,21,22,27,28,29. Simple extraction teeth #7,8,9,10,23,24,25,26, Alveoloplasty x 4 quadrants on 03/25/23 Dr [name].  Antimicrobials: None currently.  Subjective: Today, patient was seen and examined at bedside.  Denies interval complaints.  Denies overt pain, nausea, vomiting, fever or chills.  Objective: BP 129/75 (BP Location: Right Arm)   Pulse 60   Temp 97.9 F (36.6 C) (Oral)   Resp 17   Ht 5\' 9"  (1.753 m)   Wt 90.8 kg   SpO2 100%   BMI 29.56 kg/m   Examination: Body mass index is 29.56 kg/m.   General:  Average built, not in obvious distress, lying in bed, HENT:   No scleral pallor or icterus noted. Oral mucosa is moist.  Chest:  Clear breath sounds.   No crackles or wheezes.  CVS: S1 &S2 heard. No murmur.  Regular rate and rhythm. Abdomen: Soft, nontender, nondistended.  Bowel sounds are heard.   Extremities: Status post bilateral below-knee amputation. Psych: Alert, awake and  oriented, normal mood CNS:  No cranial nerve deficits.  Skin: Warm and dry.  Bilateral below-knee amputation.  Data Reviewed: I have personally  reviewed the following labs and imaging studies.    CBC Lab Results  Component Value Date   WBC 5.9 03/20/2023   RBC 4.94 03/20/2023   HGB 14.8 03/20/2023   HCT 43.6 03/20/2023   MCV 88.3 03/20/2023   MCH 30.0 03/20/2023   PLT 208 03/20/2023   MCHC 33.9 03/20/2023   RDW 14.8 03/20/2023   LYMPHSABS 2.1 12/27/2022   MONOABS 0.7 12/27/2022   EOSABS 0.1 12/27/2022   BASOSABS 0.1 12/27/2022     Last metabolic panel Lab Results  Component Value Date   NA 139 03/18/2023   K 3.8 03/18/2023   CL 106 03/18/2023   CO2 23 03/18/2023   BUN 15 03/18/2023   CREATININE 0.86 04/01/2023   GLUCOSE 163 (H) 03/18/2023   GFRNONAA >60 04/01/2023   GFRAA >60 08/21/2020   CALCIUM 9.3 03/18/2023   PHOS 4.7 (H) 07/18/2022   PROT 7.4 03/04/2023   ALBUMIN 3.5 03/04/2023   BILITOT 0.4 03/04/2023   ALKPHOS 62 03/04/2023   AST 17 03/04/2023   ALT 16 03/04/2023   ANIONGAP 10 03/18/2023    GFR: CrCl cannot be calculated (Patient's most recent lab result is older than the maximum 21 days allowed.).  No results found for this or any previous visit (from the past 240 hour(s)).    Radiology Studies: No results found.    LOS: 138 days    Joycelyn Das, MD [facility] 04/24/2023, 11:51 AM If 7PM-7AM, please contact night-coverage [email or web address]

## 2023-04-25 ENCOUNTER — Inpatient Hospital Stay (HOSPITAL_COMMUNITY): Payer: Medicaid Other

## 2023-04-25 DIAGNOSIS — I96 Gangrene, not elsewhere classified: Secondary | ICD-10-CM | POA: Diagnosis not present

## 2023-04-25 NOTE — Progress Notes (Signed)
PROGRESS NOTE    [name]  ZOX:[id number] DOB: August 09, 1966 DOA: 12/07/2022 PCP: [name], NP   Brief Narrative: [name] is a 57 y.o. male with a history of heart failure, CAD, right BKA, COPD, hyperlipidemia, hyperlipidemia, polyneuropathy, schizoaffective/bipolar disorder, was admitted to the hospital with left lower extremity cellulitis and gangrene.  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    During hospitalization, patient was also noted to have maxillary abscess and periapical abscess and underwent oral surgery with I&D and of anterior maxillary abscess and surgical extraction of multiple teeth in alveoloplasty. Currently stable.  Hospital stay prolonged due to difficulty in placement due to homelessness status status post bilateral BKA.  04/25/2023: Patient seen.  Patient reports swelling left BKA stump area with some pain on palpation.  Patient tells me that this is not new.  No fever or chills.  No recent trauma.  Will get an x-ray.  Assessment and Plan: Sepsis secondary to cellulitis/gangrene of left foot Initially received vancomycin, Cefepime and Flagyl.  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  Previous history of right below-knee amputation.    Patient is wheelchair-bound.  04/25/2023: Stable.  Patient is awaiting disposition.  Lower extremity edema: -On oral Lasix.  Will continue. -Continue to monitor closely. -Status post left BKA.  X-ray of the stump area.  Further workup will depend on above.  Anterior maxillary abscess Status post oral surgery with I&D of anterior maxillary abscess, surgical extraction of teeth 4,5,6,11,12,13,20,21,22,27,28,29. Simple extraction teeth #7,8,9,10,23,24,25,26, Alveoloplasty x 4 quadrants on 03/25/23 Dr [name].  Patient has completed Augmentin x 7 days.    Currently on regular fluid consistency diet.  Continue oral hygiene  Chronic combined systolic and  diastolic heart failure Compensated.  Continue Lasix, Coreg, hydralazine, Imdur. 2D echocardiogram from 08/2022 with LV ejection fraction of 40% with global hypokinesis.   04/25/2023: Stable.  Essential hypertension on Coreg, amlodipine and Imdur.  Dose of Coreg was increased from Coreg from 3.125 to 6.25 mg on 04/18/2023.  Hydralazine 25 mg 3 times daily has been added to the regimen.  Blood pressure overall improving.  Latest blood pressure of 129/75 04/25/2023: Blood pressure is currently controlled (132/69 mmHg).  COPD Compensated. Continue Dulera BID and albuterol.    Anxiety -Continue Atarax PRN  GERD -Continue Protonix  Constipation -Continue bowel regimen with MiraLAX , Dulcolax PRN.  On narcotics  Chronic pain On MS Contin 30 mg BID   To be supervised for concerns of previous hoarding medication.  Schizoaffective disorder Seen by psychiatry during hospitalization.  Continue Abilify and Lexapro.    DVT prophylaxis: Lovenox subcu  Code Status:   Code Status: Full Code  Family Communication:  None at bedside.  Disposition Plan:  Medically stable for discharge.  Difficult disposition.  Consultants:  Orthopedic surgery Psychiatry  Procedures:  12/10/22: Left BKA Status post oral surgery with I&D of anterior maxillary abscess, surgical extraction of teeth 4,5,6,11,12,13,20,21,22,27,28,29. Simple extraction teeth #7,8,9,10,23,24,25,26, Alveoloplasty x 4 quadrants on 03/25/23 Dr [name].  Antimicrobials: None currently.  Subjective: -Patient seen. -Patient reports swelling left BKA stump area, with possible associated pain -No recent falls or trauma.  Objective: BP 132/69 (BP Location: Left Arm)   Pulse 64   Temp 98.2 F (36.8 C) (Oral)   Resp 17   Ht 5\' 9"  (1.753 m)   Wt 90.8 kg   SpO2 97%   BMI 29.56 kg/m   Examination: Body mass index is 29.56  kg/m.   General: Patient is awake and alert.  No pallor or jaundice. HEENT: No pallor. Neck: Supple. Lungs:  Decreased air entry globally. CVs: S1-S2. Abdomen: Soft and nontender. Neuro: Moves all extremities. Extremities: Swelling of left lower extremity BKA stump (no need to patient (.   Data Reviewed: I have personally reviewed the following labs and imaging studies.    CBC Lab Results  Component Value Date   WBC 5.9 03/20/2023   RBC 4.94 03/20/2023   HGB 14.8 03/20/2023   HCT 43.6 03/20/2023   MCV 88.3 03/20/2023   MCH 30.0 03/20/2023   PLT 208 03/20/2023   MCHC 33.9 03/20/2023   RDW 14.8 03/20/2023   LYMPHSABS 2.1 12/27/2022   MONOABS 0.7 12/27/2022   EOSABS 0.1 12/27/2022   BASOSABS 0.1 12/27/2022     Last metabolic panel Lab Results  Component Value Date   NA 139 03/18/2023   K 3.8 03/18/2023   CL 106 03/18/2023   CO2 23 03/18/2023   BUN 15 03/18/2023   CREATININE 0.86 04/01/2023   GLUCOSE 163 (H) 03/18/2023   GFRNONAA >60 04/01/2023   GFRAA >60 08/21/2020   CALCIUM 9.3 03/18/2023   PHOS 4.7 (H) 07/18/2022   PROT 7.4 03/04/2023   ALBUMIN 3.5 03/04/2023   BILITOT 0.4 03/04/2023   ALKPHOS 62 03/04/2023   AST 17 03/04/2023   ALT 16 03/04/2023   ANIONGAP 10 03/18/2023    GFR: CrCl cannot be calculated (Patient's most recent lab result is older than the maximum 21 days allowed.).  No results found for this or any previous visit (from the past 240 hour(s)).    Radiology Studies: No results found.    LOS: 139 days   Time spent: 35 minutes.  Joycelyn Das, MD [facility] 04/25/2023, 5:26 PM If 7PM-7AM, please contact night-coverage [email or web address]

## 2023-04-26 DIAGNOSIS — I96 Gangrene, not elsewhere classified: Secondary | ICD-10-CM | POA: Diagnosis not present

## 2023-04-26 MED ORDER — FUROSEMIDE 40 MG PO TABS
40.0000 mg | ORAL_TABLET | Freq: Once | ORAL | Status: AC
  Administered 2023-04-26: 40 mg via ORAL
  Filled 2023-04-26: qty 1

## 2023-04-26 MED ORDER — FUROSEMIDE 20 MG PO TABS
20.0000 mg | ORAL_TABLET | Freq: Once | ORAL | Status: AC
  Administered 2023-04-26: 20 mg via ORAL
  Filled 2023-04-26: qty 1

## 2023-04-26 MED ORDER — FUROSEMIDE 10 MG/ML IJ SOLN
20.0000 mg | Freq: Once | INTRAMUSCULAR | Status: DC
  Filled 2023-04-26: qty 2

## 2023-04-26 NOTE — Progress Notes (Signed)
PROGRESS NOTE    [name]  WUJ:[id number] DOB: 11/20/1966 DOA: 12/07/2022 PCP: [name], NP   Brief Narrative: [name] is a 57 y.o. male with a history of heart failure, CAD, right BKA, COPD, hyperlipidemia, hyperlipidemia, polyneuropathy, schizoaffective/bipolar disorder, was admitted to the hospital with left lower extremity cellulitis and gangrene.  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    During hospitalization, patient was also noted to have maxillary abscess and periapical abscess and underwent oral surgery with I&D and of anterior maxillary abscess and surgical extraction of multiple teeth in alveoloplasty. Currently stable.  Hospital stay prolonged due to difficulty in placement due to homelessness status status post left BKA and right AKA.  04/25/2023: Patient seen.  Patient reports swelling left BKA stump area with some pain on palpation.  Patient tells me that this is not new.  No fever or chills.  No recent trauma.  Will get an x-ray.  04/26/2023: Patient seen.  X-ray of the left lower extremity revealed edema of the stump.  Will give extra dose of Lasix today.  Assessment and Plan: Sepsis secondary to cellulitis/gangrene of left foot Initially received vancomycin, Cefepime and Flagyl.  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  Previous history of right below-knee amputation.    Patient is wheelchair-bound.  04/25/2023: Stable.  Patient is awaiting disposition.  Lower extremity edema: -On oral Lasix.  Will continue. -Continue to monitor closely. -Status post left BKA.  X-ray of the stump area.  Further workup will depend on above. 04/26/2023: Will give extra dose of Lasix today.  Anterior maxillary abscess Status post oral surgery with I&D of anterior maxillary abscess, surgical extraction of teeth 4,5,6,11,12,13,20,21,22,27,28,29. Simple extraction teeth #7,8,9,10,23,24,25,26,  Alveoloplasty x 4 quadrants on 03/25/23 Dr [name].  Patient has completed Augmentin x 7 days.    Currently on regular fluid consistency diet.  Continue oral hygiene  Chronic combined systolic and diastolic heart failure Compensated.  Continue Lasix, Coreg, hydralazine, Imdur. 2D echocardiogram from 08/2022 with LV ejection fraction of 40% with global hypokinesis.   04/25/2023: Stable.  Essential hypertension on Coreg, amlodipine and Imdur.  Dose of Coreg was increased from Coreg from 3.125 to 6.25 mg on 04/18/2023.  Hydralazine 25 mg 3 times daily has been added to the regimen.  Blood pressure overall improving.  Latest blood pressure of 129/75 04/25/2023: Blood pressure is currently controlled (132/69 mmHg).  COPD Compensated. Continue Dulera BID and albuterol.    Anxiety -Continue Atarax PRN  GERD -Continue Protonix  Constipation -Continue bowel regimen with MiraLAX , Dulcolax PRN.  On narcotics  Chronic pain On MS Contin 30 mg BID   To be supervised for concerns of previous hoarding medication.  Schizoaffective disorder Seen by psychiatry during hospitalization.  Continue Abilify and Lexapro.    DVT prophylaxis: Lovenox subcu  Code Status:   Code Status: Full Code  Family Communication:  None at bedside.  Disposition Plan:  Medically stable for discharge.  Difficult disposition.  Consultants:  Orthopedic surgery Psychiatry  Procedures:  12/10/22: Left BKA Status post oral surgery with I&D of anterior maxillary abscess, surgical extraction of teeth 4,5,6,11,12,13,20,21,22,27,28,29. Simple extraction teeth #7,8,9,10,23,24,25,26, Alveoloplasty x 4 quadrants on 03/25/23 Dr [name].  Antimicrobials: None currently.  Subjective: -Patient seen. -No new complaints today.    Objective: BP (!) 147/77 (BP Location: Left Arm)   Pulse 70   Temp 98.9 F (37.2 C) (Oral)   Resp 18   Ht  5\' 9"  (1.753 m)   Wt 90.8 kg   SpO2 100%   BMI 29.56 kg/m   Examination: Body mass index is  29.56 kg/m.   General: Patient is awake and alert.  No pallor or jaundice. HEENT: No pallor. Neck: Supple. Lungs: Decreased air entry globally. CVs: S1-S2. Abdomen: Soft and nontender. Neuro: Moves all extremities. Extremities: Swelling of left lower extremity BKA stump (no need to patient (.   Data Reviewed: I have personally reviewed the following labs and imaging studies.    CBC Lab Results  Component Value Date   WBC 5.9 03/20/2023   RBC 4.94 03/20/2023   HGB 14.8 03/20/2023   HCT 43.6 03/20/2023   MCV 88.3 03/20/2023   MCH 30.0 03/20/2023   PLT 208 03/20/2023   MCHC 33.9 03/20/2023   RDW 14.8 03/20/2023   LYMPHSABS 2.1 12/27/2022   MONOABS 0.7 12/27/2022   EOSABS 0.1 12/27/2022   BASOSABS 0.1 12/27/2022     Last metabolic panel Lab Results  Component Value Date   NA 139 03/18/2023   K 3.8 03/18/2023   CL 106 03/18/2023   CO2 23 03/18/2023   BUN 15 03/18/2023   CREATININE 0.86 04/01/2023   GLUCOSE 163 (H) 03/18/2023   GFRNONAA >60 04/01/2023   GFRAA >60 08/21/2020   CALCIUM 9.3 03/18/2023   PHOS 4.7 (H) 07/18/2022   PROT 7.4 03/04/2023   ALBUMIN 3.5 03/04/2023   BILITOT 0.4 03/04/2023   ALKPHOS 62 03/04/2023   AST 17 03/04/2023   ALT 16 03/04/2023   ANIONGAP 10 03/18/2023    GFR: CrCl cannot be calculated (Patient's most recent lab result is older than the maximum 21 days allowed.).  No results found for this or any previous visit (from the past 240 hour(s)).    Radiology Studies: DG Tibia/Fibula Left Port  Result Date: 04/25/2023 CLINICAL DATA:  Left leg swelling and pain. EXAM: PORTABLE LEFT TIBIA AND FIBULA - 2 VIEW COMPARISON:  None Available. FINDINGS: Below-the-knee amputation. No evidence of erosive changes at the amputation sites. Heterotopic calcifications have formed. There is subcutaneous edema of the soft tissues overlying the amputation site. IMPRESSION: 1. Below-the-knee amputation. 2. No evidence of erosive changes at the amputation  sites. 3. Subcutaneous edema of the soft tissues overlying the amputation site. Electronically Signed   By: Ted Mcalpine M.D.   On: 04/25/2023 21:28      LOS: 140 days   Time spent: 35 minutes.  Joycelyn Das, MD [facility] 04/26/2023, 4:40 PM If 7PM-7AM, please contact night-coverage [email or web address]

## 2023-04-26 NOTE — Progress Notes (Signed)
       Overnight   NAME: Harmon Deighan MRN: [id number] DOB : 17-Oct-1966    Date of Service   04/26/2023   HPI/Events of Note    Notified by RN for concern over 1400 hrs Lasix dose    Interventions/ Plan   PO dose of lasix ordered for coverage of earlier missed dose ordered by attending.      Chinita Greenland BSN MSNA MSN ACNPC-AG Acute Care Nurse Practitioner [facility]

## 2023-04-26 NOTE — TOC Progression Note (Signed)
Transition of Care [facility]) - Progression Note    Patient Details  Name: [name] MRN: [id number] Date of Birth: 1966-07-01  [facility] CM/SW Contact  [name], [name] Phone Number: 04/26/2023, 12:46 PM  Clinical Narrative:     There are no discharge planning updates available at this time.   CSW will continue collaborating with [facility] staff as their team is working to obtain stable and permanent housing for patient.   Expected Discharge Plan: Skilled Nursing Facility Barriers to Discharge: Homeless with medical needs  Expected Discharge Plan and Services   Discharge Planning Services: CM Consult Post Acute Care Choice: Skilled Nursing Facility Living arrangements for the past 2 months: Homeless                                       Social Determinants of Health (SDOH) Interventions SDOH Screenings   Food Insecurity: Food Insecurity Present (11/24/2022)  Housing: Medium Risk (12/07/2022)  Transportation Needs: Unmet Transportation Needs (12/07/2022)  Utilities: Not At Risk (12/07/2022)  Alcohol Screen: Medium Risk (06/24/2019)  Depression (PHQ2-9): Medium Risk (02/10/2021)  Tobacco Use: High Risk (03/26/2023)    Readmission Risk Interventions    12/09/2022    9:02 AM 12/01/2022    3:56 PM 08/18/2022    9:00 AM  Readmission Risk Prevention Plan  Transportation Screening Complete Complete Complete  Medication Review Oceanographer) Complete Complete Complete  PCP or Specialist appointment within 3-5 days of discharge Complete Complete Complete  HRI or Home Care Consult Complete Complete Complete  SW Recovery Care/Counseling Consult Complete Complete Complete  Palliative Care Screening Not Applicable Not Applicable Not Applicable  Skilled Nursing Facility Not Applicable Not Applicable Not Applicable  SNF Comments  n/a

## 2023-04-26 NOTE — Plan of Care (Signed)
  Problem: Pain Managment: Goal: General experience of comfort will improve Outcome: Not Progressing   Problem: Safety: Goal: Ability to remain free from injury will improve Outcome: Not Progressing   Problem: Skin Integrity: Goal: Risk for impaired skin integrity will decrease Outcome: Not Progressing   Problem: Education: Goal: Knowledge of General Education information will improve Description: Including pain rating scale, medication(s)/side effects and non-pharmacologic comfort measures Outcome: Not Progressing   Problem: Health Behavior/Discharge Planning: Goal: Ability to manage health-related needs will improve Outcome: Not Progressing   Problem: Clinical Measurements: Goal: Ability to maintain clinical measurements within normal limits will improve Outcome: Not Progressing Goal: Will remain free from infection Outcome: Not Progressing Goal: Diagnostic test results will improve Outcome: Not Progressing Goal: Respiratory complications will improve Outcome: Not Progressing Goal: Cardiovascular complication will be avoided Outcome: Not Progressing   Problem: Activity: Goal: Risk for activity intolerance will decrease Outcome: Not Progressing

## 2023-04-27 DIAGNOSIS — I96 Gangrene, not elsewhere classified: Secondary | ICD-10-CM | POA: Diagnosis not present

## 2023-04-27 MED ORDER — FUROSEMIDE 40 MG PO TABS
40.0000 mg | ORAL_TABLET | Freq: Once | ORAL | Status: AC
  Administered 2023-04-27: 40 mg via ORAL
  Filled 2023-04-27: qty 1

## 2023-04-27 NOTE — Progress Notes (Signed)
PROGRESS NOTE    [name]  ZOX:[id number] DOB: 01/18/1966 DOA: 12/07/2022 PCP: [name], NP   Brief Narrative: [name] is a 57 y.o. male with a history of heart failure, CAD, right BKA, COPD, hyperlipidemia, hyperlipidemia, polyneuropathy, schizoaffective/bipolar disorder, was admitted to the hospital with left lower extremity cellulitis and gangrene.  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    During hospitalization, patient was also noted to have maxillary abscess and periapical abscess and underwent oral surgery with I&D and of anterior maxillary abscess and surgical extraction of multiple teeth in alveoloplasty. Currently stable.  Hospital stay prolonged due to difficulty in placement due to homelessness status status post left BKA and right AKA.  04/25/2023: Patient seen.  Patient reports swelling left BKA stump area with some pain on palpation.  Patient tells me that this is not new.  No fever or chills.  No recent trauma.  Will get an x-ray.  04/26/2023: Patient seen.  X-ray of the left lower extremity revealed edema of the stump.  Will give extra dose of Lasix today. 04/27/2023: Patient seen.  Patient reports some improvement in left BKA stump edema area.  Will order extra dose of Lasix today.  Assessment and Plan: Sepsis secondary to cellulitis/gangrene of left foot Initially received vancomycin, Cefepime and Flagyl.  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  Previous history of right below-knee amputation.    Patient is wheelchair-bound.  04/27/2023: Stable.  Patient is awaiting disposition.  Lower extremity edema: -On oral Lasix.  Will continue. -Continue to monitor closely. -Status post left BKA.  X-ray of the stump area.  Further workup will depend on above. 04/27/2023: Will give extra dose of Lasix today.  Anterior maxillary abscess Status post oral surgery with I&D of anterior  maxillary abscess, surgical extraction of teeth 4,5,6,11,12,13,20,21,22,27,28,29. Simple extraction teeth #7,8,9,10,23,24,25,26, Alveoloplasty x 4 quadrants on 03/25/23 Dr [name].  Patient has completed Augmentin x 7 days.    Currently on regular fluid consistency diet.  Continue oral hygiene  Chronic combined systolic and diastolic heart failure Compensated.  Continue Lasix, Coreg, hydralazine, Imdur. 2D echocardiogram from 08/2022 with LV ejection fraction of 40% with global hypokinesis.   04/25/2023: Stable.  Essential hypertension on Coreg, amlodipine and Imdur.  Dose of Coreg was increased from Coreg from 3.125 to 6.25 mg on 04/18/2023.  Hydralazine 25 mg 3 times daily has been added to the regimen.  Blood pressure overall improving.  Latest blood pressure of 129/75 04/25/2023: Blood pressure is currently controlled (132/69 mmHg).  COPD Compensated. Continue Dulera BID and albuterol.    Anxiety -Continue Atarax PRN  GERD -Continue Protonix  Constipation -Continue bowel regimen with MiraLAX , Dulcolax PRN.  On narcotics  Chronic pain On MS Contin 30 mg BID   To be supervised for concerns of previous hoarding medication.  Schizoaffective disorder Seen by psychiatry during hospitalization.  Continue Abilify and Lexapro.    DVT prophylaxis: Lovenox subcu  Code Status:   Code Status: Full Code  Family Communication:  None at bedside.  Disposition Plan:  Medically stable for discharge.  Difficult disposition.  Consultants:  Orthopedic surgery Psychiatry  Procedures:  12/10/22: Left BKA Status post oral surgery with I&D of anterior maxillary abscess, surgical extraction of teeth 4,5,6,11,12,13,20,21,22,27,28,29. Simple extraction teeth #7,8,9,10,23,24,25,26, Alveoloplasty x 4 quadrants on 03/25/23 Dr [name].  Antimicrobials: None currently.  Subjective: -Patient seen. -No new complaints today.    Objective: BP 114/82 (BP Location:  Right Arm)   Pulse 69   Temp 97.8 F (36.6  C) (Oral)   Resp 18   Ht 5\' 9"  (1.753 m)   Wt 90.8 kg   SpO2 98%   BMI 29.56 kg/m   Examination: Body mass index is 29.56 kg/m.   General: Patient is awake and alert.  No pallor or jaundice. HEENT: No pallor. Neck: Supple. Lungs: Decreased air entry globally. CVs: S1-S2. Abdomen: Soft and nontender. Neuro: Moves all extremities. Extremities: Swelling of left lower extremity BKA stump (no need to patient (.   Data Reviewed: I have personally reviewed the following labs and imaging studies.    CBC Lab Results  Component Value Date   WBC 5.9 03/20/2023   RBC 4.94 03/20/2023   HGB 14.8 03/20/2023   HCT 43.6 03/20/2023   MCV 88.3 03/20/2023   MCH 30.0 03/20/2023   PLT 208 03/20/2023   MCHC 33.9 03/20/2023   RDW 14.8 03/20/2023   LYMPHSABS 2.1 12/27/2022   MONOABS 0.7 12/27/2022   EOSABS 0.1 12/27/2022   BASOSABS 0.1 12/27/2022     Last metabolic panel Lab Results  Component Value Date   NA 139 03/18/2023   K 3.8 03/18/2023   CL 106 03/18/2023   CO2 23 03/18/2023   BUN 15 03/18/2023   CREATININE 0.86 04/01/2023   GLUCOSE 163 (H) 03/18/2023   GFRNONAA >60 04/01/2023   GFRAA >60 08/21/2020   CALCIUM 9.3 03/18/2023   PHOS 4.7 (H) 07/18/2022   PROT 7.4 03/04/2023   ALBUMIN 3.5 03/04/2023   BILITOT 0.4 03/04/2023   ALKPHOS 62 03/04/2023   AST 17 03/04/2023   ALT 16 03/04/2023   ANIONGAP 10 03/18/2023    GFR: CrCl cannot be calculated (Patient's most recent lab result is older than the maximum 21 days allowed.).  No results found for this or any previous visit (from the past 240 hour(s)).    Radiology Studies: DG Tibia/Fibula Left Port  Result Date: 04/25/2023 CLINICAL DATA:  Left leg swelling and pain. EXAM: PORTABLE LEFT TIBIA AND FIBULA - 2 VIEW COMPARISON:  None Available. FINDINGS: Below-the-knee amputation. No evidence of erosive changes at the amputation sites. Heterotopic calcifications have formed. There is subcutaneous edema of the soft  tissues overlying the amputation site. IMPRESSION: 1. Below-the-knee amputation. 2. No evidence of erosive changes at the amputation sites. 3. Subcutaneous edema of the soft tissues overlying the amputation site. Electronically Signed   By: Ted Mcalpine M.D.   On: 04/25/2023 21:28      LOS: 141 days   Time spent: 35 minutes.  Joycelyn Das, MD [facility] 04/27/2023, 5:13 PM If 7PM-7AM, please contact night-coverage [email or web address]

## 2023-04-27 NOTE — Plan of Care (Signed)
  Problem: Pain Management: Goal: Pain level will decrease with appropriate interventions Outcome: Not Progressing   Problem: Self-Care: Goal: Ability to meet self-care needs will improve Outcome: Not Progressing

## 2023-04-27 NOTE — Plan of Care (Signed)
  Problem: Pain Managment: Goal: General experience of comfort will improve Outcome: Progressing   Problem: Safety: Goal: Ability to remain free from injury will improve Outcome: Progressing   Problem: Skin Integrity: Goal: Risk for impaired skin integrity will decrease Outcome: Progressing   Problem: Activity: Goal: Risk for activity intolerance will decrease Outcome: Progressing   

## 2023-04-28 DIAGNOSIS — I96 Gangrene, not elsewhere classified: Secondary | ICD-10-CM | POA: Diagnosis not present

## 2023-04-28 DIAGNOSIS — I5040 Unspecified combined systolic (congestive) and diastolic (congestive) heart failure: Secondary | ICD-10-CM | POA: Diagnosis not present

## 2023-04-28 DIAGNOSIS — Z5900 Homelessness unspecified: Secondary | ICD-10-CM | POA: Diagnosis not present

## 2023-04-28 DIAGNOSIS — K047 Periapical abscess without sinus: Secondary | ICD-10-CM | POA: Diagnosis not present

## 2023-04-28 MED ORDER — FUROSEMIDE 20 MG PO TABS
20.0000 mg | ORAL_TABLET | Freq: Every day | ORAL | Status: DC
  Administered 2023-04-29 – 2023-06-16 (×48): 20 mg via ORAL
  Filled 2023-04-28 (×49): qty 1

## 2023-04-28 NOTE — Progress Notes (Signed)
Physical Therapy Treatment Patient Details Name: [name] MRN: [id number] DOB: Mar 29, 1966 Today's Date: 04/28/2023   History of Present Illness Pt is a 57 y/o M who presented 12/17/22 with L ankle cellulitis and underwent L BKA on 1/10. Oral abscesses noted, underwent oral surgery Wed, 4/24; PMH includes asthma, bioplar 1, depression, schizoaffective disorder, CVA, chronic CHF, homelessness, HTN, DM2, cocaine and cannabis use, and R knee disarticulation    PT Comments    Continuing work on functional mobility and activity tolerance, with specific higher level goals like floor transfers and car transfers;   [name] was having a hard day today; he voiced frustration with the fact that he has been here in the hospital for nearly 6 months, and his birthday is tomorrow; Given [name]'s low mood, he was not as motivated to get on the floor and work on components of floor to [name] transfers, but he did agree to therapeutic exercises; session focused on UE stretching and strengthening, and [name] participated well with deltoid and triceps stretches, and theraband exercises -- lat pull downs with elbow extension and bicep curls; He actively problem solved through use of theraband and showed understanding of the concepts of "anchoring" the band with one hand, or over the door, and the other UE being the "working" arm; seemed mildly pleased with more exercise options (he did request more exercises last session);   Contacted [facility], and we discussed getting shrinkers for [name]'s LEs, and they suggested connecting [name] with a support group for amputees; With his permission, I will get [name]'s contact info to them;   [facility] has offered to work on getting [name] a prosthesis for his LLE pro bono, and we scheduled an initial consult appointment for Monday, 6/3 at noon; will need orders for bil LE shrinkers, and another Prosthetist Consult; Have notified Dr. Carolyne Littles, RN, and [name] Regal, [name]   Recommendations  for follow up therapy are one component of a multi-disciplinary discharge planning process, led by the attending physician.  Recommendations may be updated based on patient status, additional functional criteria and insurance authorization.  Follow Up Recommendations  Can patient physically be transported by private vehicle:  (if car transfer is meaningful to pt, can work with sliding board on that)    Assistance Recommended at Discharge PRN  Patient can return home with the following Assist for transportation   Equipment Recommendations   (tub bench)    Recommendations for Other Services  (Recommend starting the Mental/Behavioral Health support through [name] to start while [name] is here in the hospital)     Precautions / Restrictions Precautions Precautions: None Precaution Comments: R knee disrticulation, L BKA; Fall risk is present, but minimal Restrictions RLE Weight Bearing: Weight bearing as tolerated LLE Weight Bearing: Weight bearing as tolerated Other Position/Activity Restrictions: Per chat with Dr. Lajoyce Corners, "patient may be weightbearing as tolerated with both upper and lower extremities without restrictions"                                                 Cognition Arousal/Alertness: Awake/alert Behavior During Therapy: [name] for tasks assessed/performed (though hesitant to work on floor transfers) Overall Cognitive Status: Within Functional Limits for tasks assessed  General Comments: When [name] understands rationale for an exercise or activity, and that rationale is in line with his goals, he participates much better        Exercises Amputee Exercises Chair Push Up: AROM, 10 reps, Other (comment) (with hold at the top) Other Exercises Other Exercises: Bicep curls, 10 reps each, green band Other Exercises: Deltoid stretch with modifications as needed R an L UEs Other Exercises: Tricep stretches R  and LUEs, with cues for form Other Exercises: Modified lat pull downs with added elbow extension; x5, green thereband; RUE tremors noted    General Comments General comments (skin integrity, edema, etc.): Incr time to discuss goals of session with [name], who seemed hesitant to participate in the components of the floor transfer that we planned on doing last session; Offered options for positioning of steptool and mat table; ultimately, [name] agreed to performing UE stretching and therex      Pertinent Vitals/Pain Pain Assessment Pain Assessment: Faces Faces Pain Scale: No hurt Pain Location: Reporting edema L residual limb, and concern that working on components of floor transfers cause too much pain Pain Intervention(s): Monitored during session    Home Living                          Prior Function            PT Goals (current goals can now be found in the care plan section) Acute Rehab PT Goals Patient Stated Goal: Prepare for prosthetics fitting; Would like to get housing in [facility] through the Transition to [employer] PT Goal Formulation: With patient Time For Goal Achievement: 05/19/23 Potential to Achieve Goals: Good Progress towards PT goals: Progressing toward goals (though having a rough day)    Frequency    Min 1X/week      PT Plan Current plan remains appropriate    Co-evaluation              AM-PAC PT "6 Clicks" Mobility   Outcome Measure  Help needed turning from your back to your side while in a flat bed without using bedrails?: None Help needed moving from lying on your back to sitting on the side of a flat bed without using bedrails?: None Help needed moving to and from a bed to a chair (including a wheelchair)?: None Help needed standing up from a chair using your arms (e.g., wheelchair or bedside chair)?: Total Help needed to walk in hospital room?: Total Help needed climbing 3-5 steps with a railing? : Total 6  Click Score: 15    End of Session   Activity Tolerance: Patient tolerated treatment well Patient left: Other (comment) (managing independently) Nurse Communication: Mobility status;Other (comment) (pt hesitant to participate today) PT Visit Diagnosis: Muscle weakness (generalized) (M62.81);Other abnormalities of gait and mobility (R26.89)     Time: 1610-9604 PT Time Calculation (min) (ACUTE ONLY): 47 min  Charges:  $Therapeutic Exercise: 8-22 mins $Therapeutic Activity: 8-22 mins                     Van Clines, PT  Acute Rehabilitation Services Office [phone] Secure Chat welcomed    [name] 04/28/2023, 1:42 PM

## 2023-04-28 NOTE — TOC Progression Note (Addendum)
Transition of Care [facility]) - Progression Note    Patient Details  Name: [name] MRN: [id number] Date of Birth: [birth year]  [facility] CM/SW Contact  [name], [name] Phone Number: 04/28/2023, 10:22 AM  Clinical Narrative:    CSW attempted to reach several [facility] staff members via email to request updates without success. CSW will update patient as new information becomes available.   Expected Discharge Plan: Skilled Nursing Facility Barriers to Discharge: Homeless with medical needs  Expected Discharge Plan and Services   Discharge Planning Services: CM Consult Post Acute Care Choice: Skilled Nursing Facility Living arrangements for the past 2 months: Homeless                                       Social Determinants of Health (SDOH) Interventions SDOH Screenings   Food Insecurity: Food Insecurity Present (11/24/2022)  Housing: Medium Risk (12/07/2022)  Transportation Needs: Unmet Transportation Needs (12/07/2022)  Utilities: Not At Risk (12/07/2022)  Alcohol Screen: Medium Risk (06/24/2019)  Depression (PHQ2-9): Medium Risk (02/10/2021)  Tobacco Use: High Risk (03/26/2023)    Readmission Risk Interventions    12/09/2022    9:02 AM 12/01/2022    3:56 PM 08/18/2022    9:00 AM  Readmission Risk Prevention Plan  Transportation Screening Complete Complete Complete  Medication Review Oceanographer) Complete Complete Complete  PCP or Specialist appointment within 3-5 days of discharge Complete Complete Complete  HRI or Home Care Consult Complete Complete Complete  SW Recovery Care/Counseling Consult Complete Complete Complete  Palliative Care Screening Not Applicable Not Applicable Not Applicable  Skilled Nursing Facility Not Applicable Not Applicable Not Applicable  SNF Comments  n/a

## 2023-04-28 NOTE — Progress Notes (Addendum)
Progress Note   [name]: [name] ZHY:[id number] DOB: 03-Feb-1966 DOA: 12/07/2022     142 DOS: the [name] was seen and examined on 04/28/2023   Brief hospital course: [name] is a 57 y.o. male with a history of heart failure, CAD, right BKA, COPD, hyperlipidemia, hyperlipidemia, polyneuropathy, schizoaffective/bipolar disorder, was admitted to the hospital with left lower extremity cellulitis and gangrene.   [name] was initially on vancomycin, Flagyl and cefepime and underwent left BKA on 12/10/2022.      During hospitalization, [name] was also noted to have maxillary abscess and periapical abscess and underwent oral surgery with I&D and of anterior maxillary abscess and surgical extraction of multiple teeth in alveoloplasty. Currently stable.   Hospital stay prolonged due to difficulty in placement due to homelessness status status post left BKA and right AKA.   [facility],  have offered to provide a prosthesis for LLE (the BKA)   PT scheduled an appointment for this coming Monday at noon to meet up with [name] and the Prosthetist to cast the L residual limb and make a plan.    Assessment and Plan: Sepsis secondary to cellulitis/gangrene of left foot Initially received vancomycin, Cefepime and Flagyl.  [name] underwent 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  Previous history of right below-knee amputation.    [name] is wheelchair-bound.  04/27/2023: Stable.  [name] is awaiting disposition. [name] had furosemide for stump edema, will check renal function and electrolytes in am.      Anterior maxillary abscess Status post oral surgery with I&D of anterior maxillary abscess, surgical extraction of teeth 4,5,6,11,12,13,20,21,22,27,28,29. Simple extraction teeth #7,8,9,10,23,24,25,26, Alveoloplasty x 4 quadrants on 03/25/23 Dr [name].  [name] has completed Augmentin x 7 days.    Currently on regular fluid consistency diet.  Continue oral hygiene   Chronic combined  systolic and diastolic heart failure Compensated.   [name] on Coreg, hydralazine, Imdur.  2D echocardiogram from 08/2022 with LV ejection fraction of 40% with global hypokinesis.   Continue diuresis with furosemide 20 mg po daily.   Essential hypertension on Carvedilol, hydralazine, amlodipine and Imdur.    COPD Compensated. Continue Dulera BID and albuterol.     Anxiety -Continue Atarax PRN   GERD -Continue Protonix   Constipation -Continue bowel regimen with MiraLAX , Dulcolax PRN.  On narcotics   Chronic pain On MS Contin 30 mg BID   To be supervised for concerns of previous hoarding medication.   Schizoaffective disorder Seen by psychiatry during hospitalization.  Continue Abilify and Lexapro.    Assessment and Plan: No notes have been filed under this hospital service. Service: Hospitalist       Subjective: [name] with feeling well, he has bee working with PT   Physical Exam: Vitals:   04/28/23 0500 04/28/23 0626 04/28/23 0826 04/28/23 1420  BP:  (!) 158/91 (!) 141/87 117/62  Pulse:  60 60 62  Resp:  18 17 16   Temp:  (!) 97.5 F (36.4 C)  97.8 F (36.6 C)  TempSrc:  Oral  Oral  SpO2:  100% 98% 96%  Weight: 89.8 kg     Height:       Neurology awake and alert ENT with no pallor Cardiovascular with S1 and S2 present and regular Respiratory with no wheezing or rales Abdomen with no distention  Left BKA and right AKA>   Data Reviewed:    Family Communication: no family at the bedside   Disposition: Status is: Inpatient Remains inpatient appropriate because: pending placement.   Planned  Discharge Destination:  to be determined       Author: Coralie Keens, MD 04/28/2023 4:10 PM  For on call review [email or web address].

## 2023-04-28 NOTE — Plan of Care (Signed)
  Problem: Pain Managment: Goal: General experience of comfort will improve Outcome: Progressing   Problem: Safety: Goal: Ability to remain free from injury will improve Outcome: Progressing   Problem: Skin Integrity: Goal: Risk for impaired skin integrity will decrease Outcome: Progressing   Problem: Education: Goal: Knowledge of General Education information will improve Description: Including pain rating scale, medication(s)/side effects and non-pharmacologic comfort measures Outcome: Progressing   Problem: Health Behavior/Discharge Planning: Goal: Ability to manage health-related needs will improve Outcome: Progressing   Problem: Clinical Measurements: Goal: Ability to maintain clinical measurements within normal limits will improve Outcome: Progressing Goal: Will remain free from infection Outcome: Progressing Goal: Diagnostic test results will improve Outcome: Progressing Goal: Respiratory complications will improve Outcome: Progressing Goal: Cardiovascular complication will be avoided Outcome: Progressing   Problem: Nutrition: Goal: Adequate nutrition will be maintained Outcome: Progressing   Problem: Coping: Goal: Level of anxiety will decrease Outcome: Progressing   Problem: Elimination: Goal: Will not experience complications related to bowel motility Outcome: Progressing Goal: Will not experience complications related to urinary retention Outcome: Progressing   Problem: Safety: Goal: Ability to remain free from injury will improve Outcome: Progressing   Problem: Skin Integrity: Goal: Risk for impaired skin integrity will decrease Outcome: Progressing   Problem: Education: Goal: Knowledge of the prescribed therapeutic regimen will improve Outcome: Progressing Goal: Ability to verbalize activity precautions or restrictions will improve Outcome: Progressing Goal: Understanding of discharge needs will improve Outcome: Progressing

## 2023-04-28 NOTE — Plan of Care (Signed)
  Problem: Pain Managment: Goal: General experience of comfort will improve Outcome: Progressing   Problem: Safety: Goal: Ability to remain free from injury will improve Outcome: Progressing   Problem: Skin Integrity: Goal: Risk for impaired skin integrity will decrease Outcome: Progressing   

## 2023-04-29 DIAGNOSIS — Z5900 Homelessness unspecified: Secondary | ICD-10-CM | POA: Diagnosis not present

## 2023-04-29 DIAGNOSIS — L03116 Cellulitis of left lower limb: Secondary | ICD-10-CM | POA: Diagnosis not present

## 2023-04-29 DIAGNOSIS — I96 Gangrene, not elsewhere classified: Secondary | ICD-10-CM | POA: Diagnosis not present

## 2023-04-29 DIAGNOSIS — L0390 Cellulitis, unspecified: Secondary | ICD-10-CM | POA: Diagnosis not present

## 2023-04-29 LAB — CBC WITH DIFFERENTIAL/PLATELET
Abs Immature Granulocytes: 0.01 10*3/uL (ref 0.00–0.07)
Basophils Absolute: 0 10*3/uL (ref 0.0–0.1)
Basophils Relative: 1 %
Eosinophils Absolute: 0.1 10*3/uL (ref 0.0–0.5)
Eosinophils Relative: 2 %
HCT: 44.4 % (ref 39.0–52.0)
Hemoglobin: 14.9 g/dL (ref 13.0–17.0)
Immature Granulocytes: 0 %
Lymphocytes Relative: 49 %
Lymphs Abs: 2.5 10*3/uL (ref 0.7–4.0)
MCH: 29.9 pg (ref 26.0–34.0)
MCHC: 33.6 g/dL (ref 30.0–36.0)
MCV: 89.2 fL (ref 80.0–100.0)
Monocytes Absolute: 0.6 10*3/uL (ref 0.1–1.0)
Monocytes Relative: 12 %
Neutro Abs: 1.8 10*3/uL (ref 1.7–7.7)
Neutrophils Relative %: 36 %
Platelets: 244 10*3/uL (ref 150–400)
RBC: 4.98 MIL/uL (ref 4.22–5.81)
RDW: 15.1 % (ref 11.5–15.5)
WBC: 5.1 10*3/uL (ref 4.0–10.5)
nRBC: 0 % (ref 0.0–0.2)

## 2023-04-29 LAB — COMPREHENSIVE METABOLIC PANEL
ALT: 22 U/L (ref 0–44)
AST: 24 U/L (ref 15–41)
Albumin: 3.8 g/dL (ref 3.5–5.0)
Alkaline Phosphatase: 68 U/L (ref 38–126)
Anion gap: 10 (ref 5–15)
BUN: 16 mg/dL (ref 6–20)
CO2: 24 mmol/L (ref 22–32)
Calcium: 9.4 mg/dL (ref 8.9–10.3)
Chloride: 104 mmol/L (ref 98–111)
Creatinine, Ser: 0.85 mg/dL (ref 0.61–1.24)
GFR, Estimated: >60 mL/min (ref >60)
Glucose, Bld: 71 mg/dL (ref 70–99)
Potassium: 3.5 mmol/L (ref 3.5–5.1)
Sodium: 138 mmol/L (ref 135–145)
Total Bilirubin: 1 mg/dL (ref 0.3–1.2)
Total Protein: 7.5 g/dL (ref 6.5–8.1)

## 2023-04-29 LAB — PHOSPHORUS: Phosphorus: 3.2 mg/dL (ref 2.5–4.6)

## 2023-04-29 LAB — MAGNESIUM: Magnesium: 2.1 mg/dL (ref 1.7–2.4)

## 2023-04-29 MED ORDER — POTASSIUM CHLORIDE CRYS ER 20 MEQ PO TBCR
40.0000 meq | EXTENDED_RELEASE_TABLET | Freq: Two times a day (BID) | ORAL | Status: AC
  Administered 2023-04-29: 40 meq via ORAL
  Filled 2023-04-29: qty 2

## 2023-04-29 MED ORDER — PANTOPRAZOLE SODIUM 40 MG PO TBEC
40.0000 mg | DELAYED_RELEASE_TABLET | Freq: Every day | ORAL | Status: DC
  Administered 2023-04-29 – 2023-05-03 (×5): 40 mg via ORAL
  Filled 2023-04-29 (×5): qty 1

## 2023-04-29 NOTE — Progress Notes (Signed)
Orthopedic Tech Progress Note Patient Details:  [name] January 10, 1966 [id number]  Called in order to [facility] for BLE RESIDUAL LIMB SHRINKERS   Patient ID: [name], male   DOB: [birth year], 57 y.o.   MRN: [id number]  [name] 04/29/2023, 11:25 AM

## 2023-04-29 NOTE — TOC Progression Note (Signed)
Transition of Care [facility]) - Progression Note    Patient Details  Name: [name] MRN: [id number] Date of Birth: [birth year]  [facility]) CM/SW Contact  [name], [name] Phone Number: 04/29/2023, 8:50 AM  Clinical Narrative:    CSW spoke with patient at bedside to inform him he has a meeting with Olegario Messier, ACT team clinician today at 1pm via Facetime - patient agreeable to participate. CSW informed patient of updates received by [facility] staff stating that his first housing choice ([employer]) does not have any openings at this time but a different home on [address] is being considered by [employer].  CSW will provide patient with an iPad to participate in ACT team assessment today.   Expected Discharge Plan: Skilled Nursing Facility Barriers to Discharge: Homeless with medical needs  Expected Discharge Plan and Services   Discharge Planning Services: CM Consult Post Acute Care Choice: Skilled Nursing Facility Living arrangements for the past 2 months: Homeless                                       Social Determinants of Health (SDOH) Interventions SDOH Screenings   Food Insecurity: Food Insecurity Present (11/24/2022)  Housing: Medium Risk (12/07/2022)  Transportation Needs: Unmet Transportation Needs (12/07/2022)  Utilities: Not At Risk (12/07/2022)  Alcohol Screen: Medium Risk (06/24/2019)  Depression (PHQ2-9): Medium Risk (02/10/2021)  Tobacco Use: High Risk (03/26/2023)    Readmission Risk Interventions    12/09/2022    9:02 AM 12/01/2022    3:56 PM 08/18/2022    9:00 AM  Readmission Risk Prevention Plan  Transportation Screening Complete Complete Complete  Medication Review Oceanographer) Complete Complete Complete  PCP or Specialist appointment within 3-5 days of discharge Complete Complete Complete  HRI or Home Care Consult Complete Complete Complete  SW Recovery Care/Counseling Consult Complete Complete Complete  Palliative Care  Screening Not Applicable Not Applicable Not Applicable  Skilled Nursing Facility Not Applicable Not Applicable Not Applicable  SNF Comments  n/a

## 2023-04-29 NOTE — Progress Notes (Signed)
PROGRESS NOTE    [name]  OZH:[id number] DOB: 1966/10/14 DOA: 12/07/2022 PCP: [name], NP   Brief Narrative:  [name] is a 57 y.o. male with a history of heart failure, CAD, right BKA, COPD, hyperlipidemia, hyperlipidemia, polyneuropathy, schizoaffective/bipolar disorder, was admitted to the hospital with left lower extremity cellulitis and gangrene.   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      During hospitalization, patient was also noted to have maxillary abscess and periapical abscess and underwent oral surgery with I&D and of anterior maxillary abscess and surgical extraction of multiple teeth in alveoloplasty. Currently stable.   Hospital stay prolonged due to difficulty in placement due to homelessness status status post left BKA and right AKA.   [facility],  have offered to provide a prosthesis for LLE (the BKA)   PT scheduled an appointment for this coming Monday at noon to meet up with patient and the Prosthetist to cast the L residual limb and make a plan and patient to receive a prosthesis for his left leg next week pro bono.    Assessment and Plan:  Sepsis secondary to cellulitis/gangrene of left foot -Initially received vancomycin, Cefepime and Flagyl.  -Patient underwent 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   -Previous history of right below-knee amputation.    -Patient is wheelchair-bound.  04/27/2023: Stable.  Patient is awaiting disposition. -Patient had furosemide for stump edema and electrolytes were checked and potassium was low normal so we will replete -Discussed with physical therapy and patient is to get a prosthesis for his left side pro bono next week and Prosthetist consult has been placed   Anterior maxillary abscess -Status post oral surgery with I&D of anterior maxillary abscess, surgical extraction of teeth 4,5,6,11,12,13,20,21,22,27,28,29. Simple extraction teeth  #7,8,9,10,23,24,25,26, Alveoloplasty x 4 quadrants on 03/25/23 Dr [name].   -Patient has completed Augmentin x 7 days.     -Currently on regular fluid consistency diet.  Continue oral hygiene and continue with benzocaine mucosal gel for the mouth and throat 4 times daily as needed for mild pain   Chronic combined systolic and diastolic heart failure -Compensated.   -Patient on Carvedilol 6.25 g p.o. twice daily, Amlodipine 10 mg p.o. daily, Furosemide 20 mg p.o. daily, and Isosorbide mononitrate 50 mg p.o. daily along with Hydralazine 25 mg every 8 -2D echocardiogram from 08/2022 with LV ejection fraction of 40% with global hypokinesis.  -Strict I's and O's and daily weights No intake or output data in the 24 hours ending 04/29/23 1307  -Continue diuresis with furosemide 20 mg po daily and continue monitor for signs and symptoms of volume overload  Essential hypertension -on carvedilol 6.25 g p.o. twice daily, amlodipine 10 mg p.o. daily, furosemide 20 mg p.o. daily, and isosorbide mononitrate 50 mg p.o. daily along with hydralazine 25 mg every 8 -Continue to monitor blood pressures per protocol   COPD -Compensated. Continue Xopenex 0.63 mg nebs every 6 as needed  -Continue to monitor respiratory status carefully   Anxiety -No longer appears to be on Atarax but if necessary will reorder   GERD/GI prophylaxis -Continue Pantoprazole 40 mg po Daily    Constipation -Continue bowel regimen with MiraLAX , Dulcolax PRN.  On narcotics   Chronic pain -On MS Contin 30 mg every 12 scheduled and has oxycodone 10 mg p.o. every 4 as needed for severe pain for 7-10 as well as tramadol 50 mg p.o. every 12 as needed for moderate  pain and also on gabapentin 900 mg p.o. 3 times daily -To be supervised for concerns of previous hoarding medication. -Continue with bowel regimen with bisacodyl 5 mg p.o. daily as needed for moderate constipation, and MiraLAX 17 g p.o. daily as needed along with magnesium citrate  1 bottle once as needed for severe constipation   Schizoaffective Disorder -Seen by Psychiatry during hospitalization. -Continue Aripiprazole 5 mg p.o. daily and Escitalopram 5 mg p.o. Daily.     DVT prophylaxis: enoxaparin (LOVENOX) injection 40 mg Start: 02/25/23 2200 SCD's Start: 12/10/22 1731    Code Status: Full Code Family Communication: No family currently at bedside  Disposition Plan:  Level of care: Med-Surg Status is: Inpatient Remains inpatient appropriate because: Has a unsafe discharge disposition   Consultants:  Psychiatry Orthopedic surgery Dietary  Procedures:  As delineated as above  Antimicrobials:  Anti-infectives (From admission, onward)    Start     Dose/Rate Route Frequency Ordered Stop   03/19/23 0900  amoxicillin-clavulanate (AUGMENTIN) 875-125 MG per tablet 1 tablet        1 tablet Oral Every 12 hours 03/19/23 0809 04/01/23 2118   03/18/23 2000  Ampicillin-Sulbactam (UNASYN) 3 g in sodium chloride 0.9 % 100 mL IVPB  Status:  Discontinued        3 g 200 mL/hr over 30 Minutes Intravenous Every 6 hours 03/18/23 1939 03/19/23 0809   03/18/23 1645  Ampicillin-Sulbactam (UNASYN) 3 g in sodium chloride 0.9 % 100 mL IVPB  Status:  Discontinued        3 g 200 mL/hr over 30 Minutes Intravenous Every 6 hours 03/18/23 1555 03/18/23 1939   12/26/22 2200  doxycycline (VIBRA-TABS) tablet 100 mg        100 mg Oral Every 12 hours 12/26/22 1307 01/08/23 2126   12/24/22 1000  doxycycline (VIBRA-TABS) tablet 100 mg  Status:  Discontinued        100 mg Oral Every 12 hours 12/24/22 0715 12/26/22 1307   12/10/22 1830  ceFAZolin (ANCEF) IVPB 2g/100 mL premix  Status:  Discontinued        2 g 200 mL/hr over 30 Minutes Intravenous Every 8 hours 12/10/22 1731 12/10/22 1740   12/10/22 1330  ceFAZolin (ANCEF) IVPB 2g/100 mL premix        2 g 200 mL/hr over 30 Minutes Intravenous On call to O.R. 12/10/22 0959 12/10/22 1534   12/08/22 0530  vancomycin (VANCOREADY) IVPB 1500  mg/300 mL        1,500 mg 150 mL/hr over 120 Minutes Intravenous Every 12 hours 12/07/22 1741 12/11/22 0955   12/08/22 0345  metroNIDAZOLE (FLAGYL) IVPB 500 mg        500 mg 100 mL/hr over 60 Minutes Intravenous Every 12 hours 12/07/22 1742 12/12/22 0344   12/07/22 2330  ceFEPIme (MAXIPIME) 2 g in sodium chloride 0.9 % 100 mL IVPB        2 g 200 mL/hr over 30 Minutes Intravenous Every 8 hours 12/07/22 1741 12/11/22 1529   12/07/22 2200  metroNIDAZOLE (FLAGYL) IVPB 500 mg  Status:  Discontinued        500 mg 100 mL/hr over 60 Minutes Intravenous Every 12 hours 12/07/22 2048 12/07/22 2056   12/07/22 1530  ceFEPIme (MAXIPIME) 2 g in sodium chloride 0.9 % 100 mL IVPB        2 g 200 mL/hr over 30 Minutes Intravenous  Once 12/07/22 1516 12/07/22 1734   12/07/22 1530  vancomycin (VANCOREADY) IVPB 1500 mg/300 mL  1,500 mg 150 mL/hr over 120 Minutes Intravenous  Once 12/07/22 1516 12/09/22 0333   12/07/22 1515  metroNIDAZOLE (FLAGYL) IVPB 500 mg        500 mg 100 mL/hr over 60 Minutes Intravenous  Once 12/07/22 1506 12/07/22 1734   12/07/22 1500  ceFAZolin (ANCEF) IVPB 1 g/50 mL premix  Status:  Discontinued        1 g 100 mL/hr over 30 Minutes Intravenous  Once 12/07/22 1452 12/07/22 1513       Subjective: Seen and examined at bedside and he was rolling around in his wheelchair.  States that he had a good day today and happy that he is being evaluated for a left leg prosthesis.  No nausea or vomiting.  States that he feels like he is going to have a bowel movement soon.  No other concerns or complaints this time.  Objective: Vitals:   04/28/23 1420 04/28/23 1920 04/29/23 0500 04/29/23 0905  BP: 117/62 (!) 161/98  (!) 143/90  Pulse: 62 71  64  Resp: 16 16  17   Temp: 97.8 F (36.6 C) 98.3 F (36.8 C)  (!) 97.4 F (36.3 C)  TempSrc: Oral Oral  Oral  SpO2: 96% 95%  98%  Weight:   91.3 kg   Height:       No intake or output data in the 24 hours ending 04/29/23 1312 Filed  Weights   04/28/23 0500 04/29/23 0500  Weight: 89.8 kg 91.3 kg   Examination: Physical Exam:  Constitutional: WN/WD overweight chronically ill-appearing African-American male in no acute distress appears calm and using a wheelchair to mobilize Respiratory: Diminished to auscultation bilaterally, no wheezing, rales, rhonchi or crackles. Normal respiratory effort and patient is not tachypenic. No accessory muscle use.  Unlabored breathing Cardiovascular: RRR, no murmurs / rubs / gallops. S1 and S2 auscultated. Abdomen: Soft, non-tender, distended secondary to body habitus. Bowel sounds positive.  GU: Deferred. Musculoskeletal: No clubbing / cyanosis of digits/nails.  Has a left leg BKA and a right leg AKA Skin: No rashes, lesions, ulcers on limited skin evaluation but does have some edema in his left leg stump. No induration; Warm and dry.  Neurologic: CN 2-12 grossly intact with no focal deficits.  Romberg sign and cerebellar reflexes not assessed.  Psychiatric: Normal judgment and insight. Alert and oriented x 3. Normal mood and appropriate affect.   Data Reviewed: I have personally reviewed following labs and imaging studies  CBC: Recent Labs  Lab 04/29/23 0940  WBC 5.1  NEUTROABS 1.8  HGB 14.9  HCT 44.4  MCV 89.2  PLT 244   Basic Metabolic Panel: Recent Labs  Lab 04/29/23 0940  NA 138  K 3.5  CL 104  CO2 24  GLUCOSE 71  BUN 16  CREATININE 0.85  CALCIUM 9.4  MG 2.1  PHOS 3.2   GFR: Estimated Creatinine Clearance: 109.6 mL/min (by C-G formula based on SCr of 0.85 mg/dL). Liver Function Tests: Recent Labs  Lab 04/29/23 0940  AST 24  ALT 22  ALKPHOS 68  BILITOT 1.0  PROT 7.5  ALBUMIN 3.8   No results for input(s): "LIPASE", "AMYLASE" in the last 168 hours. No results for input(s): "AMMONIA" in the last 168 hours. Coagulation Profile: No results for input(s): "INR", "PROTIME" in the last 168 hours. Cardiac Enzymes: No results for input(s): "CKTOTAL",  "CKMB", "CKMBINDEX", "TROPONINI" in the last 168 hours. BNP (last 3 results) No results for input(s): "PROBNP" in the last 8760 hours. HbA1C: No results  for input(s): "HGBA1C" in the last 72 hours. CBG: No results for input(s): "GLUCAP" in the last 168 hours. Lipid Profile: No results for input(s): "CHOL", "HDL", "LDLCALC", "TRIG", "CHOLHDL", "LDLDIRECT" in the last 72 hours. Thyroid Function Tests: No results for input(s): "TSH", "T4TOTAL", "FREET4", "T3FREE", "THYROIDAB" in the last 72 hours. Anemia Panel: No results for input(s): "VITAMINB12", "FOLATE", "FERRITIN", "TIBC", "IRON", "RETICCTPCT" in the last 72 hours. Sepsis Labs: No results for input(s): "PROCALCITON", "LATICACIDVEN" in the last 168 hours.  No results found for this or any previous visit (from the past 240 hour(s)).   Radiology Studies: No results found.  Scheduled Meds:  amLODipine  10 mg Oral Daily   ARIPiprazole  5 mg Oral Daily   vitamin C  1,000 mg Oral Daily   carvedilol  6.25 mg Oral BID WC   chlorhexidine  15 mL Mouth/Throat TID   enoxaparin (LOVENOX) injection  40 mg Subcutaneous Q24H   escitalopram  5 mg Oral Daily   furosemide  20 mg Oral Daily   gabapentin  900 mg Oral TID   hydrALAZINE  25 mg Oral Q8H   isosorbide mononitrate  15 mg Oral Daily   melatonin  3 mg Oral QHS   morphine  30 mg Oral Q12H   multivitamin with minerals  1 tablet Oral Daily   nystatin  5 mL Oral QID   pantoprazole  40 mg Oral Daily   potassium chloride  40 mEq Oral BID   Continuous Infusions:   LOS: 143 days   Marguerita Merles, DO [facility] Available via Epic secure chat 7am-7pm After these hours, please refer to coverage provider listed on [email or web address] 04/29/2023, 1:12 PM

## 2023-04-29 NOTE — Progress Notes (Signed)
Physical Therapy Treatment Patient Details Name: [name] MRN: [id number] DOB: [birth year] Today's Date: 04/29/2023   History of Present Illness Pt is a 57 y/o M who presented 12/17/22 with L ankle cellulitis and underwent L BKA on 1/10. Oral abscesses noted, underwent oral surgery Wed, 4/24; PMH includes asthma, bioplar 1, depression, schizoaffective disorder, CVA, chronic CHF, homelessness, HTN, DM2, cocaine and cannabis use, and R knee disarticulation    PT Comments    It's [name]'s birthday today!  Informed [name] of offer from [facility] to fit him for a prosthesis for his LLE pro bono, and he met the news with gratitude; Discussed prep for L prosthesis, including using shrinkers (provided by [employer] and delivered today) for limb shaping and therex for hamstring and hip flexor stretch, and quad and gluteal strengthening; Assisted [name] in donning LLE shrinker, and demonstrated how to don shrinker for RLE; Plan to meet with Prosthetist on Monday at noon to cast for LLE mold   Recommendations for follow up therapy are one component of a multi-disciplinary discharge planning process, led by the attending physician.  Recommendations may be updated based on patient status, additional functional criteria and insurance authorization.  Follow Up Recommendations  Can patient physically be transported by private vehicle:  (if car transfer is meaningful to pt, can work with sliding board on that)    Assistance Recommended at Discharge PRN  Patient can return home with the following Assist for transportation   Equipment Recommendations   (tub bench)    Recommendations for Other Services  (Recommend starting the Mental/Behavioral Health support through [name] to start while [name] is here in the hospital; [name] consult)     Precautions / Restrictions Precautions Precautions: None Precaution Comments: R knee disrticulation, L BKA; Fall risk is present, but  minimal Restrictions RLE Weight Bearing: Weight bearing as tolerated LLE Weight Bearing: Weight bearing as tolerated                                                    Cognition Arousal/Alertness: Awake/alert Behavior During Therapy: WFL for tasks assessed/performed Overall Cognitive Status: Within Functional Limits for tasks assessed                                 General Comments: When [name] understands rationale for an exercise or activity, and that rationale is in line with his goals, he participates much better        Exercises      General Comments General comments (skin integrity, edema, etc.): Provided education re: shrinker wear rationale and donning      Pertinent Vitals/Pain Pain Assessment Pain Assessment: Faces Faces Pain Scale: No hurt Pain Intervention(s): Monitored during session    Home Living                          Prior Function            PT Goals (current goals can now be found in the care plan section) Acute Rehab PT Goals Patient Stated Goal: Prepare for prosthetics fitting; Would like to get housing in [facility] through the Transition to [employer] PT Goal Formulation: With patient Time For Goal Achievement: 05/19/23 Potential to Achieve Goals: Good Progress towards PT  goals: Progressing toward goals    Frequency    Min 1X/week      PT Plan Current plan remains appropriate;Frequency needs to be updated (When prosthesis training begins, will incr frequency)    Co-evaluation              AM-PAC PT "6 Clicks" Mobility   Outcome Measure  Help needed turning from your back to your side while in a flat bed without using bedrails?: None Help needed moving from lying on your back to sitting on the side of a flat bed without using bedrails?: None Help needed moving to and from a bed to a chair (including a wheelchair)?: None Help needed standing up from a chair using  your arms (e.g., wheelchair or bedside chair)?: Total Help needed to walk in hospital room?: Total Help needed climbing 3-5 steps with a railing? : Total 6 Click Score: 15    End of Session Equipment Utilized During Treatment: Other (comment) (shrinkers provided by [facility]) Activity Tolerance: Patient tolerated treatment well Patient left:  (managing independently) Nurse Communication: Mobility status PT Visit Diagnosis: Muscle weakness (generalized) (M62.81);Other abnormalities of gait and mobility (R26.89)     Time: 1446-1500 PT Time Calculation (min) (ACUTE ONLY): 14 min  Charges:  $Self Care/Home Management: 8-22                     Van Clines, PT  Acute Rehabilitation Services Office [phone] Secure Chat welcomed    [name] 04/29/2023, 6:34 PM

## 2023-04-29 NOTE — TOC Progression Note (Signed)
Transition of Care [facility]) - Progression Note    Patient Details  Name: [name] MRN: [id number] Date of Birth: 08-13-1966  [facility]) CM/SW Contact  [name], [name] Phone Number: 04/29/2023, 1:36 PM  Clinical Narrative:    CSW assisted patient with Facetime meeting with Andris Flurry for ACT services.  Patient reports Rochelle from [place] did visit him to complete an assessment. Patient reports Lorayne Marek did not have any updates regarding his housing.   Expected Discharge Plan: Home/Self Care Barriers to Discharge: Homeless with medical needs  Expected Discharge Plan and Services   Discharge Planning Services: CM Consult Post Acute Care Choice: Home Health Living arrangements for the past 2 months: Homeless                     Social Determinants of Health (SDOH) Interventions SDOH Screenings   Food Insecurity: Food Insecurity Present (11/24/2022)  Housing: Medium Risk (12/07/2022)  Transportation Needs: Unmet Transportation Needs (12/07/2022)  Utilities: Not At Risk (12/07/2022)  Alcohol Screen: Medium Risk (06/24/2019)  Depression (PHQ2-9): Medium Risk (02/10/2021)  Tobacco Use: High Risk (03/26/2023)    Readmission Risk Interventions    12/09/2022    9:02 AM 12/01/2022    3:56 PM 08/18/2022    9:00 AM  Readmission Risk Prevention Plan  Transportation Screening Complete Complete Complete  Medication Review Oceanographer) Complete Complete Complete  PCP or Specialist appointment within 3-5 days of discharge Complete Complete Complete  HRI or Home Care Consult Complete Complete Complete  SW Recovery Care/Counseling Consult Complete Complete Complete  Palliative Care Screening Not Applicable Not Applicable Not Applicable  Skilled Nursing Facility Not Applicable Not Applicable Not Applicable  SNF Comments  n/a

## 2023-04-29 NOTE — Progress Notes (Signed)
Mobility Specialist Progress Note:   04/29/23 1600  Mobility  Activity  (self-propelled in w/c)  Level of Assistance Modified independent, requires aide device or extra time  Assistive Device Wheelchair  Distance Ambulated (ft) 500 ft  Activity Response Tolerated well  Mobility Referral Yes  $Mobility charge 1 Mobility  Mobility Specialist Start Time (ACUTE ONLY) 1545  Mobility Specialist Stop Time (ACUTE ONLY) 1610  Mobility Specialist Time Calculation (min) (ACUTE ONLY) 25 min   Pt eager for birthday mobility session! No physical assistance required. Back in room with visitors present.  Thompson Grayer Mobility Specialist  Please contact vis Secure Chat or  Rehab Office [phone]

## 2023-04-30 DIAGNOSIS — I96 Gangrene, not elsewhere classified: Secondary | ICD-10-CM | POA: Diagnosis not present

## 2023-04-30 DIAGNOSIS — L03116 Cellulitis of left lower limb: Secondary | ICD-10-CM | POA: Diagnosis not present

## 2023-04-30 DIAGNOSIS — Z5900 Homelessness unspecified: Secondary | ICD-10-CM | POA: Diagnosis not present

## 2023-04-30 DIAGNOSIS — L0390 Cellulitis, unspecified: Secondary | ICD-10-CM | POA: Diagnosis not present

## 2023-04-30 NOTE — Progress Notes (Signed)
PROGRESS NOTE    [name]  ZOX:[id number] DOB: 1966-11-04 DOA: 12/07/2022 PCP: [name], NP   Brief Narrative:  [name] is a 57 y.o. male with a history of heart failure, CAD, right BKA, COPD, hyperlipidemia, hyperlipidemia, polyneuropathy, schizoaffective/bipolar disorder, was admitted to the hospital with left lower extremity cellulitis and gangrene.   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      During hospitalization, patient was also noted to have maxillary abscess and periapical abscess and underwent oral surgery with I&D and of anterior maxillary abscess and surgical extraction of multiple teeth in alveoloplasty. Currently stable.   Hospital stay prolonged due to difficulty in placement due to homelessness status status post left BKA and right AKA.   [facility],  have offered to provide a prosthesis for LLE (the BKA)   PT scheduled an appointment for this coming Monday at noon to meet up with patient and the Prosthetist to cast the L residual limb and make a plan and patient to receive a prosthesis for his left leg next week pro bono (Monday June 3rd, 2024).    Assessment and Plan:  Sepsis secondary to cellulitis/gangrene of left foot s/p Left BKA -Initially received vancomycin, Cefepime and Flagyl.  -Patient underwent 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   -Previous history of right below-knee amputation.    -Patient is wheelchair-bound.  04/27/2023: Stable.  Patient is awaiting disposition. -Patient had furosemide for stump edema and electrolytes were checked and potassium was low normal so we will replete -Discussed with physical therapy and patient is to get a prosthesis for his left side pro bono next week and Prosthetist consult has been placed; Plan is to meet with Prosthetist on Monday June 3rd at noon to cast for LLE mold. -PT assisting patient in donning LLE Shrinker and demonstrated how  to don shrinker on RLE    Anterior maxillary abscess -Status post oral surgery with I&D of anterior maxillary abscess, surgical extraction of teeth 4,5,6,11,12,13,20,21,22,27,28,29. Simple extraction teeth #7,8,9,10,23,24,25,26, Alveoloplasty x 4 quadrants on 03/25/23 Dr [name].   -Patient has completed Augmentin x 7 days.     -Currently on regular fluid consistency diet.  Continue oral hygiene and continue with benzocaine mucosal gel for the mouth and throat 4 times daily as needed for mild pain   Chronic combined systolic and diastolic heart failure -Compensated.   -Patient on Carvedilol 6.25 g p.o. twice daily, Amlodipine 10 mg p.o. daily, Furosemide 20 mg p.o. daily, and Isosorbide mononitrate 50 mg p.o. daily along with Hydralazine 25 mg every 8 -2D echocardiogram from 08/2022 with LV ejection fraction of 40% with global hypokinesis.  -Strict I's and O's and daily weights No intake or output data in the 24 hours ending 04/29/23 1307  -Continue diuresis with furosemide 20 mg po daily and continue monitor for signs and symptoms of volume overload  Essential Hypertension -on Carvedilol 6.25 g p.o. twice daily, Amlodipine 10 mg p.o. daily, Furosemide 20 mg p.o. daily, and Isosorbide Mononitrate 50 mg p.o. daily along with Hydralazine 25 mg every 8 -Continue to monitor blood pressures per protocol -Last BP reading was 158/96   COPD -Compensated. Continue Xopenex 0.63 mg nebs every 6 as needed  -Continue to monitor respiratory status carefully   Anxiety -No longer appears to be on Atarax but if necessary will reorder   GERD/GI prophylaxis -Continue Pantoprazole 40 mg po Daily    Constipation -Continue bowel regimen with MiraLAX ,  Dulcolax PRN.  On narcotics   Chronic pain -On MS Contin 30 mg every 12 scheduled and has oxycodone 10 mg p.o. every 4 as needed for severe pain for 7-10 as well as tramadol 50 mg p.o. every 12 as needed for moderate pain and also on gabapentin 900 mg p.o. 3  times daily -To be supervised for concerns of previous hoarding medication. -Continue with bowel regimen with bisacodyl 5 mg p.o. daily as needed for moderate constipation, and MiraLAX 17 g p.o. daily as needed along with magnesium citrate 1 bottle once as needed for severe constipation   Schizoaffective Disorder -Seen by Psychiatry during hospitalization. -Continue Aripiprazole 5 mg p.o. daily and Escitalopram 5 mg p.o. Daily.     DVT prophylaxis: enoxaparin (LOVENOX) injection 40 mg Start: 02/25/23 2200 SCD's Start: 12/10/22 1731    Code Status: Full Code Family Communication: No family present at bedside   Disposition Plan:  Level of care: Med-Surg Status is: Inpatient Remains inpatient appropriate because: Has an unsafe discharge disposition; TOC assisting with discharge disposition and per [name] doumentation: "CSW assisted patient with Facetime meeting with Andris Flurry for ACT services.   Patient reports Rochelle from [facility] did visit him to complete an assessment. Patient reports Lorayne Marek did not have any updates regarding his housing."  Consultants:  Psychiatry Orthopedic surgery Dietary  Procedures:  As delineated as above  Antimicrobials:  Anti-infectives (From admission, onward)    Start     Dose/Rate Route Frequency Ordered Stop   03/19/23 0900  amoxicillin-clavulanate (AUGMENTIN) 875-125 MG per tablet 1 tablet        1 tablet Oral Every 12 hours 03/19/23 0809 04/01/23 2118   03/18/23 2000  Ampicillin-Sulbactam (UNASYN) 3 g in sodium chloride 0.9 % 100 mL IVPB  Status:  Discontinued        3 g 200 mL/hr over 30 Minutes Intravenous Every 6 hours 03/18/23 1939 03/19/23 0809   03/18/23 1645  Ampicillin-Sulbactam (UNASYN) 3 g in sodium chloride 0.9 % 100 mL IVPB  Status:  Discontinued        3 g 200 mL/hr over 30 Minutes Intravenous Every 6 hours 03/18/23 1555 03/18/23 1939   12/26/22 2200  doxycycline (VIBRA-TABS) tablet 100 mg        100 mg Oral Every 12 hours  12/26/22 1307 01/08/23 2126   12/24/22 1000  doxycycline (VIBRA-TABS) tablet 100 mg  Status:  Discontinued        100 mg Oral Every 12 hours 12/24/22 0715 12/26/22 1307   12/10/22 1830  ceFAZolin (ANCEF) IVPB 2g/100 mL premix  Status:  Discontinued        2 g 200 mL/hr over 30 Minutes Intravenous Every 8 hours 12/10/22 1731 12/10/22 1740   12/10/22 1330  ceFAZolin (ANCEF) IVPB 2g/100 mL premix        2 g 200 mL/hr over 30 Minutes Intravenous On call to O.R. 12/10/22 0959 12/10/22 1534   12/08/22 0530  vancomycin (VANCOREADY) IVPB 1500 mg/300 mL        1,500 mg 150 mL/hr over 120 Minutes Intravenous Every 12 hours 12/07/22 1741 12/11/22 0955   12/08/22 0345  metroNIDAZOLE (FLAGYL) IVPB 500 mg        500 mg 100 mL/hr over 60 Minutes Intravenous Every 12 hours 12/07/22 1742 12/12/22 0344   12/07/22 2330  ceFEPIme (MAXIPIME) 2 g in sodium chloride 0.9 % 100 mL IVPB        2 g 200 mL/hr over 30 Minutes Intravenous Every  8 hours 12/07/22 1741 12/11/22 1529   12/07/22 2200  metroNIDAZOLE (FLAGYL) IVPB 500 mg  Status:  Discontinued        500 mg 100 mL/hr over 60 Minutes Intravenous Every 12 hours 12/07/22 2048 12/07/22 2056   12/07/22 1530  ceFEPIme (MAXIPIME) 2 g in sodium chloride 0.9 % 100 mL IVPB        2 g 200 mL/hr over 30 Minutes Intravenous  Once 12/07/22 1516 12/07/22 1734   12/07/22 1530  vancomycin (VANCOREADY) IVPB 1500 mg/300 mL        1,500 mg 150 mL/hr over 120 Minutes Intravenous  Once 12/07/22 1516 12/09/22 0333   12/07/22 1515  metroNIDAZOLE (FLAGYL) IVPB 500 mg        500 mg 100 mL/hr over 60 Minutes Intravenous  Once 12/07/22 1506 12/07/22 1734   12/07/22 1500  ceFAZolin (ANCEF) IVPB 1 g/50 mL premix  Status:  Discontinued        1 g 100 mL/hr over 30 Minutes Intravenous  Once 12/07/22 1452 12/07/22 1513       Subjective: Seen and examined at bedside and he was rolling around in his wheelchair.  He had no complaints overnight and felt fairly well.  States he had a  good birthday.  Denies any nausea or vomiting.  Happy that he is going to get fitted for a prosthesis next week and has his stump shrinker on the left.  No other concerns or complaints at this time.  Objective: Vitals:   04/29/23 0500 04/29/23 0905 04/29/23 1440 04/29/23 2127  BP:  (!) 143/90 137/81 (!) 158/96  Pulse:  64 66 88  Resp:  17 16 16   Temp:  (!) 97.4 F (36.3 C) 98.4 F (36.9 C) 98 F (36.7 C)  TempSrc:  Oral Oral Oral  SpO2:  98% 96% 96%  Weight: 91.3 kg     Height:       No intake or output data in the 24 hours ending 04/30/23 0738 Filed Weights   04/28/23 0500 04/29/23 0500  Weight: 89.8 kg 91.3 kg   Examination: Physical Exam:  Constitutional: WN/WD overweight chronically ill-appearing African-American male in no acute distress appears calm and using his wheelchair to mobilize Respiratory: Diminished to auscultation bilaterally, no wheezing, rales, rhonchi or crackles. Normal respiratory effort and patient is not tachypenic. No accessory muscle use.  Unlabored breathing Cardiovascular: RRR, no murmurs / rubs / gallops. S1 and S2 auscultated. No extremity edema. 2+ pedal pulses. No carotid bruits.  Abdomen: Soft, non-tender, distended secondary to body habitus. Bowel sounds positive.  GU: Deferred. Musculoskeletal: No clubbing / cyanosis of digits/nails.  Has a left BKA and a right AKA Skin: No rashes, lesions, ulcers on the skin evaluation. No induration; Warm and dry.  Neurologic: CN 2-12 grossly intact with no focal deficits.  Romberg sign and cerebellar reflexes not assessed.  Psychiatric: Normal judgment and insight. Alert and oriented x 3. Normal mood and appropriate affect.   Data Reviewed: I have personally reviewed following labs and imaging studies  CBC: Recent Labs  Lab 04/29/23 0940  WBC 5.1  NEUTROABS 1.8  HGB 14.9  HCT 44.4  MCV 89.2  PLT 244   Basic Metabolic Panel: Recent Labs  Lab 04/29/23 0940  NA 138  K 3.5  CL 104  CO2 24   GLUCOSE 71  BUN 16  CREATININE 0.85  CALCIUM 9.4  MG 2.1  PHOS 3.2   GFR: Estimated Creatinine Clearance: 109.6 mL/min (by C-G formula  based on SCr of 0.85 mg/dL). Liver Function Tests: Recent Labs  Lab 04/29/23 0940  AST 24  ALT 22  ALKPHOS 68  BILITOT 1.0  PROT 7.5  ALBUMIN 3.8   No results for input(s): "LIPASE", "AMYLASE" in the last 168 hours. No results for input(s): "AMMONIA" in the last 168 hours. Coagulation Profile: No results for input(s): "INR", "PROTIME" in the last 168 hours. Cardiac Enzymes: No results for input(s): "CKTOTAL", "CKMB", "CKMBINDEX", "TROPONINI" in the last 168 hours. BNP (last 3 results) No results for input(s): "PROBNP" in the last 8760 hours. HbA1C: No results for input(s): "HGBA1C" in the last 72 hours. CBG: No results for input(s): "GLUCAP" in the last 168 hours. Lipid Profile: No results for input(s): "CHOL", "HDL", "LDLCALC", "TRIG", "CHOLHDL", "LDLDIRECT" in the last 72 hours. Thyroid Function Tests: No results for input(s): "TSH", "T4TOTAL", "FREET4", "T3FREE", "THYROIDAB" in the last 72 hours. Anemia Panel: No results for input(s): "VITAMINB12", "FOLATE", "FERRITIN", "TIBC", "IRON", "RETICCTPCT" in the last 72 hours. Sepsis Labs: No results for input(s): "PROCALCITON", "LATICACIDVEN" in the last 168 hours.  No results found for this or any previous visit (from the past 240 hour(s)).   Radiology Studies: No results found.  Scheduled Meds:  amLODipine  10 mg Oral Daily   ARIPiprazole  5 mg Oral Daily   vitamin C  1,000 mg Oral Daily   carvedilol  6.25 mg Oral BID WC   chlorhexidine  15 mL Mouth/Throat TID   enoxaparin (LOVENOX) injection  40 mg Subcutaneous Q24H   escitalopram  5 mg Oral Daily   furosemide  20 mg Oral Daily   gabapentin  900 mg Oral TID   hydrALAZINE  25 mg Oral Q8H   isosorbide mononitrate  15 mg Oral Daily   melatonin  3 mg Oral QHS   morphine  30 mg Oral Q12H   multivitamin with minerals  1 tablet  Oral Daily   nystatin  5 mL Oral QID   pantoprazole  40 mg Oral Daily   potassium chloride  40 mEq Oral BID   Continuous Infusions:   LOS: 144 days   Marguerita Merles, DO [facility] Available via Epic secure chat 7am-7pm After these hours, please refer to coverage provider listed on [email or web address] 04/30/2023, 7:38 AM

## 2023-04-30 NOTE — Plan of Care (Signed)
  Problem: Pain Managment: Goal: General experience of comfort will improve Outcome: Progressing   Problem: Safety: Goal: Ability to remain free from injury will improve Outcome: Progressing   Problem: Skin Integrity: Goal: Risk for impaired skin integrity will decrease Outcome: Progressing   

## 2023-04-30 NOTE — Progress Notes (Signed)
Mobility Specialist Progress Note   04/30/23 1000  Mobility  Activity Transferred from bed to chair;Transferred from chair to bed (BLE AAROM & AROM)  Level of Assistance Modified independent, requires aide device or extra time  Assistive Device Wheelchair  Range of Motion/Exercises Active;Active Assistive;All extremities  RLE Weight Bearing NWB  LLE Weight Bearing NWB  Activity Response Tolerated well   Patient received in [facility] eager to participate. Completed A&P transfer to mat table for BLE AAROM & AROM. Spent a considerable amount of time reviewing hamstring/posterior stretches and breathing techniques. Tolerated without complaint or incident. Returned to [facility] with all needs met.  [name], BS EXP Mobility Specialist Please contact via SecureChat or Rehab office at [phone]

## 2023-05-01 DIAGNOSIS — I96 Gangrene, not elsewhere classified: Secondary | ICD-10-CM | POA: Diagnosis not present

## 2023-05-01 DIAGNOSIS — Z5900 Homelessness unspecified: Secondary | ICD-10-CM | POA: Diagnosis not present

## 2023-05-01 DIAGNOSIS — L0390 Cellulitis, unspecified: Secondary | ICD-10-CM | POA: Diagnosis not present

## 2023-05-01 DIAGNOSIS — L03116 Cellulitis of left lower limb: Secondary | ICD-10-CM | POA: Diagnosis not present

## 2023-05-01 NOTE — Progress Notes (Signed)
PROGRESS NOTE    [name]  AVW:[id number] DOB: 08/31/1966 DOA: 12/07/2022 PCP: [name], NP   Brief Narrative:  Darcey Bonifacio is a 57 y.o. male with a history of heart failure, CAD, right BKA, COPD, hyperlipidemia, hyperlipidemia, polyneuropathy, schizoaffective/bipolar disorder, was admitted to the hospital with left lower extremity cellulitis and gangrene.   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      During hospitalization, patient was also noted to have maxillary abscess and periapical abscess and underwent oral surgery with I&D and of anterior maxillary abscess and surgical extraction of multiple teeth in alveoloplasty. Currently stable.   Hospital stay prolonged due to difficulty in placement due to homelessness status status post left BKA and right AKA.   [facility],  have offered to provide a prosthesis for LLE (the BKA)   PT scheduled an appointment for this coming Monday (June 3rd, 2024) at noon to meet up with patient and the Prosthetist to cast the L residual limb and make a plan and patient to receive a prosthesis for his left leg next week pro bono.   Assessment and Plan:  Sepsis secondary to cellulitis/gangrene of left foot s/p Left BKA -Initially received vancomycin, Cefepime and Flagyl.  -Patient underwent 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   -Previous history of right below-knee amputation.    -Patient is wheelchair-bound.  04/27/2023: Stable.  Patient is awaiting disposition. -Patient had furosemide for stump edema and electrolytes were checked and potassium was low normal so we will replete -Discussed with physical therapy and patient is to get a prosthesis for his left side pro bono next week and Prosthetist consult has been placed; Plan is to meet with Prosthetist on Monday June 3rd at noon to cast for LLE mold. -PT assisting patient in donning LLE Shrinker and demonstrated how to don  shrinker on RLE    Anterior Maxillary Abscess -Status post oral surgery with I&D of anterior maxillary abscess, surgical extraction of teeth 4,5,6,11,12,13,20,21,22,27,28,29. Simple extraction teeth #7,8,9,10,23,24,25,26, Alveoloplasty x 4 quadrants on 03/25/23 Dr [name].   -Patient has completed Augmentin x 7 days.     -Currently on regular fluid consistency diet.  Continue oral hygiene and continue with benzocaine mucosal gel for the mouth and throat 4 times daily as needed for mild pain   Chronic Combined Systolic and Diastolic Heart failure -Compensated.   -Patient on Carvedilol 6.25 g p.o. twice daily, Amlodipine 10 mg p.o. daily, Furosemide 20 mg p.o. daily, and Isosorbide mononitrate 50 mg p.o. daily along with Hydralazine 25 mg every 8 -2D echocardiogram from 08/2022 with LV ejection fraction of 40% with global hypokinesis.  -Strict I's and O's and daily weights  Intake/Output Summary (Last 24 hours) at 05/01/2023 1100 Last data filed at 05/01/2023 0919 Gross per 24 hour  Intake 720 ml  Output --  Net 720 ml  -Continue diuresis with furosemide 20 mg po daily and continue monitor for signs and symptoms of volume overload  Essential Hypertension -on Carvedilol 6.25 g p.o. twice daily, Amlodipine 10 mg p.o. daily, Furosemide 20 mg p.o. daily, and Isosorbide Mononitrate 50 mg p.o. daily along with Hydralazine 25 mg every 8 -Continue to monitor blood pressures per protocol -Last BP reading was 141/86   COPD -Compensated. Continue Xopenex 0.63 mg nebs every 6 as needed  -Continue to monitor respiratory status carefully   Anxiety -No longer appears to be on Atarax but if necessary will reorder   GERD/GI prophylaxis -  Continue Pantoprazole 40 mg po Daily    Constipation -Continue Bowel Regimen with MiraLAX , Dulcolax PRN.  On narcotics   Chronic pain -On MS Contin 30 mg every 12 scheduled and has oxycodone 10 mg p.o. every 4 as needed for severe pain for 7-10 as well as tramadol 50 mg  p.o. every 12 as needed for moderate pain and also on gabapentin 900 mg p.o. 3 times daily -To be supervised for concerns of previous hoarding medication. -Continue with bowel regimen with bisacodyl 5 mg p.o. daily as needed for moderate constipation, and MiraLAX 17 g p.o. daily as needed along with magnesium citrate 1 bottle once as needed for severe constipation   Schizoaffective Disorder -Seen by Psychiatry during hospitalization. -Continue Aripiprazole 5 mg p.o. daily and Escitalopram 5 mg p.o. Daily.     DVT prophylaxis: enoxaparin (LOVENOX) injection 40 mg Start: 02/25/23 2200 SCD's Start: 12/10/22 1731    Code Status: Full Code Family Communication: No family present at bedside  Disposition Plan:  Level of care: Med-Surg Status is: Inpatient Remains inpatient appropriate because: Has an unsafe discharge disposition; TOC assisting with discharge disposition   Consultants:  Psychiatry Orthopedic surgery Podiatry  Procedures:  As delineated as above  Antimicrobials:  Anti-infectives (From admission, onward)    Start     Dose/Rate Route Frequency Ordered Stop   03/19/23 0900  amoxicillin-clavulanate (AUGMENTIN) 875-125 MG per tablet 1 tablet        1 tablet Oral Every 12 hours 03/19/23 0809 04/01/23 2118   03/18/23 2000  Ampicillin-Sulbactam (UNASYN) 3 g in sodium chloride 0.9 % 100 mL IVPB  Status:  Discontinued        3 g 200 mL/hr over 30 Minutes Intravenous Every 6 hours 03/18/23 1939 03/19/23 0809   03/18/23 1645  Ampicillin-Sulbactam (UNASYN) 3 g in sodium chloride 0.9 % 100 mL IVPB  Status:  Discontinued        3 g 200 mL/hr over 30 Minutes Intravenous Every 6 hours 03/18/23 1555 03/18/23 1939   12/26/22 2200  doxycycline (VIBRA-TABS) tablet 100 mg        100 mg Oral Every 12 hours 12/26/22 1307 01/08/23 2126   12/24/22 1000  doxycycline (VIBRA-TABS) tablet 100 mg  Status:  Discontinued        100 mg Oral Every 12 hours 12/24/22 0715 12/26/22 1307   12/10/22 1830   ceFAZolin (ANCEF) IVPB 2g/100 mL premix  Status:  Discontinued        2 g 200 mL/hr over 30 Minutes Intravenous Every 8 hours 12/10/22 1731 12/10/22 1740   12/10/22 1330  ceFAZolin (ANCEF) IVPB 2g/100 mL premix        2 g 200 mL/hr over 30 Minutes Intravenous On call to O.R. 12/10/22 0959 12/10/22 1534   12/08/22 0530  vancomycin (VANCOREADY) IVPB 1500 mg/300 mL        1,500 mg 150 mL/hr over 120 Minutes Intravenous Every 12 hours 12/07/22 1741 12/11/22 0955   12/08/22 0345  metroNIDAZOLE (FLAGYL) IVPB 500 mg        500 mg 100 mL/hr over 60 Minutes Intravenous Every 12 hours 12/07/22 1742 12/12/22 0344   12/07/22 2330  ceFEPIme (MAXIPIME) 2 g in sodium chloride 0.9 % 100 mL IVPB        2 g 200 mL/hr over 30 Minutes Intravenous Every 8 hours 12/07/22 1741 12/11/22 1529   12/07/22 2200  metroNIDAZOLE (FLAGYL) IVPB 500 mg  Status:  Discontinued  500 mg 100 mL/hr over 60 Minutes Intravenous Every 12 hours 12/07/22 2048 12/07/22 2056   12/07/22 1530  ceFEPIme (MAXIPIME) 2 g in sodium chloride 0.9 % 100 mL IVPB        2 g 200 mL/hr over 30 Minutes Intravenous  Once 12/07/22 1516 12/07/22 1734   12/07/22 1530  vancomycin (VANCOREADY) IVPB 1500 mg/300 mL        1,500 mg 150 mL/hr over 120 Minutes Intravenous  Once 12/07/22 1516 12/09/22 0333   12/07/22 1515  metroNIDAZOLE (FLAGYL) IVPB 500 mg        500 mg 100 mL/hr over 60 Minutes Intravenous  Once 12/07/22 1506 12/07/22 1734   12/07/22 1500  ceFAZolin (ANCEF) IVPB 1 g/50 mL premix  Status:  Discontinued        1 g 100 mL/hr over 30 Minutes Intravenous  Once 12/07/22 1452 12/07/22 1513       Subjective: Seen and examined at bedside and he is doing fairly well.  He denies any chest pain or shortness of breath.  No nausea or vomiting.  States that he had a fairly good night.  Very appreciated of of care.  No other concerns or complaints at this time.  Objective: Vitals:   04/29/23 1440 04/29/23 2127 04/30/23 0815 04/30/23 2108   BP: 137/81 (!) 158/96 (!) 153/93 (!) 141/86  Pulse: 66 88 76 73  Resp: 16 16 18 17   Temp: 98.4 F (36.9 C) 98 F (36.7 C) 97.6 F (36.4 C) 97.8 F (36.6 C)  TempSrc: Oral Oral Oral Oral  SpO2: 96% 96% 96% 96%  Weight:      Height:        Intake/Output Summary (Last 24 hours) at 05/01/2023 1102 Last data filed at 05/01/2023 1610 Gross per 24 hour  Intake 720 ml  Output --  Net 720 ml   Filed Weights   04/28/23 0500 04/29/23 0500  Weight: 89.8 kg 91.3 kg   Examination: Physical Exam:  Constitutional: WN/WD overweight chronically ill-appearing African-American male in no acute distress appears calm and continues to use his wheelchair to mobilize Respiratory: Diminished to auscultation bilaterally, no wheezing, rales, rhonchi or crackles. Normal respiratory effort and patient is not tachypenic. No accessory muscle use.  Unlabored breathing Cardiovascular: RRR, no murmurs / rubs / gallops. S1 and S2 auscultated.  Has some edema on his left stump Abdomen: Soft, non-tender, distended secondary to body habitus. Bowel sounds positive.  GU: Deferred. Musculoskeletal: No clubbing / cyanosis of digits/nails.  Has a left BKA and a right AKA Skin: No rashes, lesions, ulcers on limited skin evaluation. No induration; Warm and dry.  Neurologic: CN 2-12 grossly intact with no focal deficits. Romberg sign and cerebellar reflexes not assessed.  Psychiatric: Normal judgment and insight. Alert and oriented x 3. Normal mood and appropriate affect.   Data Reviewed: I have personally reviewed following labs and imaging studies  CBC: Recent Labs  Lab 04/29/23 0940  WBC 5.1  NEUTROABS 1.8  HGB 14.9  HCT 44.4  MCV 89.2  PLT 244   Basic Metabolic Panel: Recent Labs  Lab 04/29/23 0940  NA 138  K 3.5  CL 104  CO2 24  GLUCOSE 71  BUN 16  CREATININE 0.85  CALCIUM 9.4  MG 2.1  PHOS 3.2   GFR: Estimated Creatinine Clearance: 109.6 mL/min (by C-G formula based on SCr of 0.85  mg/dL). Liver Function Tests: Recent Labs  Lab 04/29/23 0940  AST 24  ALT 22  ALKPHOS  68  BILITOT 1.0  PROT 7.5  ALBUMIN 3.8   No results for input(s): "LIPASE", "AMYLASE" in the last 168 hours. No results for input(s): "AMMONIA" in the last 168 hours. Coagulation Profile: No results for input(s): "INR", "PROTIME" in the last 168 hours. Cardiac Enzymes: No results for input(s): "CKTOTAL", "CKMB", "CKMBINDEX", "TROPONINI" in the last 168 hours. BNP (last 3 results) No results for input(s): "PROBNP" in the last 8760 hours. HbA1C: No results for input(s): "HGBA1C" in the last 72 hours. CBG: No results for input(s): "GLUCAP" in the last 168 hours. Lipid Profile: No results for input(s): "CHOL", "HDL", "LDLCALC", "TRIG", "CHOLHDL", "LDLDIRECT" in the last 72 hours. Thyroid Function Tests: No results for input(s): "TSH", "T4TOTAL", "FREET4", "T3FREE", "THYROIDAB" in the last 72 hours. Anemia Panel: No results for input(s): "VITAMINB12", "FOLATE", "FERRITIN", "TIBC", "IRON", "RETICCTPCT" in the last 72 hours. Sepsis Labs: No results for input(s): "PROCALCITON", "LATICACIDVEN" in the last 168 hours.  No results found for this or any previous visit (from the past 240 hour(s)).   Radiology Studies: No results found.  Scheduled Meds:  amLODipine  10 mg Oral Daily   ARIPiprazole  5 mg Oral Daily   vitamin C  1,000 mg Oral Daily   carvedilol  6.25 mg Oral BID WC   chlorhexidine  15 mL Mouth/Throat TID   enoxaparin (LOVENOX) injection  40 mg Subcutaneous Q24H   escitalopram  5 mg Oral Daily   furosemide  20 mg Oral Daily   gabapentin  900 mg Oral TID   hydrALAZINE  25 mg Oral Q8H   isosorbide mononitrate  15 mg Oral Daily   melatonin  3 mg Oral QHS   morphine  30 mg Oral Q12H   multivitamin with minerals  1 tablet Oral Daily   nystatin  5 mL Oral QID   pantoprazole  40 mg Oral Daily   Continuous Infusions:   LOS: 145 days   Marguerita Merles, DO [facility] Available  via Epic secure chat 7am-7pm After these hours, please refer to coverage provider listed on [email or web address] 05/01/2023, 11:02 AM

## 2023-05-01 NOTE — Plan of Care (Signed)
  Problem: Pain Managment: Goal: General experience of comfort will improve Outcome: Adequate for Discharge   Problem: Safety: Goal: Ability to remain free from injury will improve Outcome: Adequate for Discharge   Problem: Skin Integrity: Goal: Risk for impaired skin integrity will decrease Outcome: Adequate for Discharge   

## 2023-05-01 NOTE — TOC Progression Note (Signed)
Transition of Care [facility]) - Progression Note    Patient Details  Name: [name] MRN: [id number] Date of Birth: 03/22/1966  [facility] CM/SW Contact  [name], [name] Phone Number: 05/01/2023, 11:16 AM  Clinical Narrative:     There are no discharge planning updates available at this time.  CSW remains in contact with [facility] staff that is working on obtaining patient permanent housing.  Expected Discharge Plan: Home/Self Care Barriers to Discharge: Homeless with medical needs  Expected Discharge Plan and Services   Discharge Planning Services: CM Consult Post Acute Care Choice: Home Health Living arrangements for the past 2 months: Homeless                                       Social Determinants of Health (SDOH) Interventions SDOH Screenings   Food Insecurity: Food Insecurity Present (11/24/2022)  Housing: Medium Risk (12/07/2022)  Transportation Needs: Unmet Transportation Needs (12/07/2022)  Utilities: Not At Risk (12/07/2022)  Alcohol Screen: Medium Risk (06/24/2019)  Depression (PHQ2-9): Medium Risk (02/10/2021)  Tobacco Use: High Risk (03/26/2023)    Readmission Risk Interventions    12/09/2022    9:02 AM 12/01/2022    3:56 PM 08/18/2022    9:00 AM  Readmission Risk Prevention Plan  Transportation Screening Complete Complete Complete  Medication Review Oceanographer) Complete Complete Complete  PCP or Specialist appointment within 3-5 days of discharge Complete Complete Complete  HRI or Home Care Consult Complete Complete Complete  SW Recovery Care/Counseling Consult Complete Complete Complete  Palliative Care Screening Not Applicable Not Applicable Not Applicable  Skilled Nursing Facility Not Applicable Not Applicable Not Applicable  SNF Comments  n/a

## 2023-05-02 DIAGNOSIS — L0390 Cellulitis, unspecified: Secondary | ICD-10-CM | POA: Diagnosis not present

## 2023-05-02 DIAGNOSIS — L03116 Cellulitis of left lower limb: Secondary | ICD-10-CM | POA: Diagnosis not present

## 2023-05-02 DIAGNOSIS — Z5900 Homelessness unspecified: Secondary | ICD-10-CM | POA: Diagnosis not present

## 2023-05-02 DIAGNOSIS — I96 Gangrene, not elsewhere classified: Secondary | ICD-10-CM | POA: Diagnosis not present

## 2023-05-02 NOTE — Plan of Care (Signed)
  Problem: Pain Managment: Goal: General experience of comfort will improve Outcome: Progressing   Problem: Safety: Goal: Ability to remain free from injury will improve Outcome: Progressing   Problem: Skin Integrity: Goal: Risk for impaired skin integrity will decrease Outcome: Progressing   

## 2023-05-02 NOTE — Progress Notes (Signed)
PROGRESS NOTE    [name]  ZOX:[id number] DOB: [birth year] DOA: 12/07/2022 PCP: [name], NP   Brief Narrative:  [name] is a 57 y.o. male with a history of heart failure, CAD, right BKA, COPD, hyperlipidemia, hyperlipidemia, polyneuropathy, schizoaffective/bipolar disorder, was admitted to the hospital with left lower extremity cellulitis and gangrene.   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      During hospitalization, patient was also noted to have maxillary abscess and periapical abscess and underwent oral surgery with I&D and of anterior maxillary abscess and surgical extraction of multiple teeth in alveoloplasty. Currently stable.   Hospital stay prolonged due to difficulty in placement due to homelessness status status post left BKA and right AKA.   [facility],  have offered to provide a prosthesis for LLE (the BKA)   PT scheduled an appointment for this coming Monday (June 3rd, 2024) at noon to meet up with patient and the Prosthetist to cast the L residual limb and make a plan and patient to receive a prosthesis for his left leg next week pro bono.   Assessment and Plan:  Sepsis secondary to cellulitis/gangrene of left foot s/p Left BKA -Initially received vancomycin, Cefepime and Flagyl.  -Patient underwent 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   -Previous history of right below-knee amputation.    -Patient is wheelchair-bound.  04/27/2023: Stable.  Patient is awaiting disposition. -Patient had furosemide for stump edema and electrolytes were checked and potassium was low normal so we will replete -Discussed with physical therapy and patient is to get a prosthesis for his left side pro bono next week and Prosthetist consult has been placed; Plan is to meet with Prosthetist on Monday June 3rd at noon to cast for LLE mold. -PT assisting patient in donning LLE Shrinker and demonstrated how to don  shrinker on RLE    Anterior Maxillary Abscess -Status post oral surgery with I&D of anterior maxillary abscess, surgical extraction of teeth 4,5,6,11,12,13,20,21,22,27,28,29. Simple extraction teeth #7,8,9,10,23,24,25,26, Alveoloplasty x 4 quadrants on 03/25/23 Dr [name].   -Patient has completed Augmentin x 7 days.     -Currently on regular fluid consistency diet.  Continue oral hygiene and continue with benzocaine mucosal gel for the mouth and throat 4 times daily as needed for mild pain   Chronic Combined Systolic and Diastolic Heart failure -Compensated.   -Patient on Carvedilol 6.25 g p.o. twice daily, Amlodipine 10 mg p.o. daily, Furosemide 20 mg p.o. daily, and Isosorbide mononitrate 50 mg p.o. daily along with Hydralazine 25 mg every 8 -2D echocardiogram from 08/2022 with LV ejection fraction of 40% with global hypokinesis.  -Strict I's and O's and daily weights  Intake/Output Summary (Last 24 hours) at 05/02/2023 0835 Last data filed at 05/01/2023 1300 Gross per 24 hour  Intake 720 ml  Output 600 ml  Net 120 ml  -Continue diuresis with furosemide 20 mg po daily and continue monitor for signs and symptoms of volume overload  Essential Hypertension -on Carvedilol 6.25 g p.o. twice daily, Amlodipine 10 mg p.o. daily, Furosemide 20 mg p.o. daily, and Isosorbide Mononitrate 50 mg p.o. daily along with Hydralazine 25 mg every 8 -Continue to monitor blood pressures per protocol -Last BP reading was elevated at 165/100   COPD -Compensated. Continue Xopenex 0.63 mg nebs every 6 as needed  -Continue to monitor respiratory status carefully   Anxiety -No longer appears to be on Atarax but if necessary will reorder  GERD/GI prophylaxis -Continue Pantoprazole 40 mg po Daily    Constipation -Continue Bowel Regimen with MiraLAX , Dulcolax PRN.  On narcotics   Chronic pain -On MS Contin 30 mg every 12 scheduled and has oxycodone 10 mg p.o. every 4 as needed for severe pain for 7-10 as well  as tramadol 50 mg p.o. every 12 as needed for moderate pain and also on gabapentin 900 mg p.o. 3 times daily -To be supervised for concerns of previous hoarding medication. -Continue with bowel regimen with bisacodyl 5 mg p.o. daily as needed for moderate constipation, and MiraLAX 17 g p.o. daily as needed along with magnesium citrate 1 bottle once as needed for severe constipation   Schizoaffective Disorder -Seen by Psychiatry during hospitalization. -Continue Aripiprazole 5 mg p.o. daily and Escitalopram 5 mg p.o. Daily.     DVT prophylaxis: enoxaparin (LOVENOX) injection 40 mg Start: 02/25/23 2200 SCD's Start: 12/10/22 1731    Code Status: Full Code Family Communication: No family present at bedside  Disposition Plan:  Level of care: Med-Surg Status is: Inpatient Remains inpatient appropriate because: Has an unsafe discharge disposition; TOC assisting with discharge disposition    Consultants:  Psychiatry Orthopedic surgery Podiatry  Procedures:  As delineated as above  Antimicrobials:  Anti-infectives (From admission, onward)    Start     Dose/Rate Route Frequency Ordered Stop   03/19/23 0900  amoxicillin-clavulanate (AUGMENTIN) 875-125 MG per tablet 1 tablet        1 tablet Oral Every 12 hours 03/19/23 0809 04/01/23 2118   03/18/23 2000  Ampicillin-Sulbactam (UNASYN) 3 g in sodium chloride 0.9 % 100 mL IVPB  Status:  Discontinued        3 g 200 mL/hr over 30 Minutes Intravenous Every 6 hours 03/18/23 1939 03/19/23 0809   03/18/23 1645  Ampicillin-Sulbactam (UNASYN) 3 g in sodium chloride 0.9 % 100 mL IVPB  Status:  Discontinued        3 g 200 mL/hr over 30 Minutes Intravenous Every 6 hours 03/18/23 1555 03/18/23 1939   12/26/22 2200  doxycycline (VIBRA-TABS) tablet 100 mg        100 mg Oral Every 12 hours 12/26/22 1307 01/08/23 2126   12/24/22 1000  doxycycline (VIBRA-TABS) tablet 100 mg  Status:  Discontinued        100 mg Oral Every 12 hours 12/24/22 0715 12/26/22  1307   12/10/22 1830  ceFAZolin (ANCEF) IVPB 2g/100 mL premix  Status:  Discontinued        2 g 200 mL/hr over 30 Minutes Intravenous Every 8 hours 12/10/22 1731 12/10/22 1740   12/10/22 1330  ceFAZolin (ANCEF) IVPB 2g/100 mL premix        2 g 200 mL/hr over 30 Minutes Intravenous On call to O.R. 12/10/22 0959 12/10/22 1534   12/08/22 0530  vancomycin (VANCOREADY) IVPB 1500 mg/300 mL        1,500 mg 150 mL/hr over 120 Minutes Intravenous Every 12 hours 12/07/22 1741 12/11/22 0955   12/08/22 0345  metroNIDAZOLE (FLAGYL) IVPB 500 mg        500 mg 100 mL/hr over 60 Minutes Intravenous Every 12 hours 12/07/22 1742 12/12/22 0344   12/07/22 2330  ceFEPIme (MAXIPIME) 2 g in sodium chloride 0.9 % 100 mL IVPB        2 g 200 mL/hr over 30 Minutes Intravenous Every 8 hours 12/07/22 1741 12/11/22 1529   12/07/22 2200  metroNIDAZOLE (FLAGYL) IVPB 500 mg  Status:  Discontinued  500 mg 100 mL/hr over 60 Minutes Intravenous Every 12 hours 12/07/22 2048 12/07/22 2056   12/07/22 1530  ceFEPIme (MAXIPIME) 2 g in sodium chloride 0.9 % 100 mL IVPB        2 g 200 mL/hr over 30 Minutes Intravenous  Once 12/07/22 1516 12/07/22 1734   12/07/22 1530  vancomycin (VANCOREADY) IVPB 1500 mg/300 mL        1,500 mg 150 mL/hr over 120 Minutes Intravenous  Once 12/07/22 1516 12/09/22 0333   12/07/22 1515  metroNIDAZOLE (FLAGYL) IVPB 500 mg        500 mg 100 mL/hr over 60 Minutes Intravenous  Once 12/07/22 1506 12/07/22 1734   12/07/22 1500  ceFAZolin (ANCEF) IVPB 1 g/50 mL premix  Status:  Discontinued        1 g 100 mL/hr over 30 Minutes Intravenous  Once 12/07/22 1452 12/07/22 1513       Subjective: Seen and examined at bedside he is doing well and using his wheelchair to move around the halls.  Denies any chest pain or shortness of breath.  No nausea or vomiting.  Has not had a bowel movement yet today.  No other concerns or complaints at this time and is looking forward to Monday.  Objective: Vitals:    04/30/23 2108 05/01/23 1538 05/01/23 2052 05/02/23 0759  BP: (!) 141/86 (!) 135/90 (!) 160/92 (!) 165/100  Pulse: 73 67 71 (!) 58  Resp: 17 18 16 17   Temp: 97.8 F (36.6 C) 98.5 F (36.9 C) 98.4 F (36.9 C)   TempSrc: Oral  Oral   SpO2: 96% 100% 97% 96%  Weight:      Height:        Intake/Output Summary (Last 24 hours) at 05/02/2023 0835 Last data filed at 05/01/2023 1300 Gross per 24 hour  Intake 720 ml  Output 600 ml  Net 120 ml   Filed Weights   04/28/23 0500 04/29/23 0500  Weight: 89.8 kg 91.3 kg   Examination: Physical Exam:  Constitutional: WN/WD overweight chronically ill-appearing African-American male in no acute distress appears calm and continues to use his wheelchair to mobilize Respiratory: Diminished to auscultation bilaterally with coarse breath sounds, no wheezing, rales, rhonchi or crackles. Normal respiratory effort and patient is not tachypenic. No accessory muscle use.  Unlabored breathing Cardiovascular: RRR, no murmurs / rubs / gallops. S1 and S2 auscultated.  Has some mild edema in his left stump Abdomen: Soft, non-tender, distended secondary to body habitus. Bowel sounds positive.  GU: Deferred. Musculoskeletal: No clubbing / cyanosis of digits/nails.  Has a left BKA was a stump shrinker and a right AKA Skin: No rashes, lesions, ulcers on limited skin evaluation. No induration; Warm and dry.  Neurologic: CN 2-12 grossly intact with no focal deficits. Romberg sign and cerebellar reflexes not assessed.  Psychiatric: Normal judgment and insight. Alert and oriented x 3. Normal mood and appropriate affect.   Data Reviewed: I have personally reviewed following labs and imaging studies  CBC: Recent Labs  Lab 04/29/23 0940  WBC 5.1  NEUTROABS 1.8  HGB 14.9  HCT 44.4  MCV 89.2  PLT 244   Basic Metabolic Panel: Recent Labs  Lab 04/29/23 0940  NA 138  K 3.5  CL 104  CO2 24  GLUCOSE 71  BUN 16  CREATININE 0.85  CALCIUM 9.4  MG 2.1  PHOS 3.2    GFR: Estimated Creatinine Clearance: 109.6 mL/min (by C-G formula based on SCr of 0.85 mg/dL). Liver Function  Tests: Recent Labs  Lab 04/29/23 0940  AST 24  ALT 22  ALKPHOS 68  BILITOT 1.0  PROT 7.5  ALBUMIN 3.8   No results for input(s): "LIPASE", "AMYLASE" in the last 168 hours. No results for input(s): "AMMONIA" in the last 168 hours. Coagulation Profile: No results for input(s): "INR", "PROTIME" in the last 168 hours. Cardiac Enzymes: No results for input(s): "CKTOTAL", "CKMB", "CKMBINDEX", "TROPONINI" in the last 168 hours. BNP (last 3 results) No results for input(s): "PROBNP" in the last 8760 hours. HbA1C: No results for input(s): "HGBA1C" in the last 72 hours. CBG: No results for input(s): "GLUCAP" in the last 168 hours. Lipid Profile: No results for input(s): "CHOL", "HDL", "LDLCALC", "TRIG", "CHOLHDL", "LDLDIRECT" in the last 72 hours. Thyroid Function Tests: No results for input(s): "TSH", "T4TOTAL", "FREET4", "T3FREE", "THYROIDAB" in the last 72 hours. Anemia Panel: No results for input(s): "VITAMINB12", "FOLATE", "FERRITIN", "TIBC", "IRON", "RETICCTPCT" in the last 72 hours. Sepsis Labs: No results for input(s): "PROCALCITON", "LATICACIDVEN" in the last 168 hours.  No results found for this or any previous visit (from the past 240 hour(s)).   Radiology Studies: No results found.  Scheduled Meds:  amLODipine  10 mg Oral Daily   ARIPiprazole  5 mg Oral Daily   vitamin C  1,000 mg Oral Daily   carvedilol  6.25 mg Oral BID WC   chlorhexidine  15 mL Mouth/Throat TID   enoxaparin (LOVENOX) injection  40 mg Subcutaneous Q24H   escitalopram  5 mg Oral Daily   furosemide  20 mg Oral Daily   gabapentin  900 mg Oral TID   hydrALAZINE  25 mg Oral Q8H   isosorbide mononitrate  15 mg Oral Daily   melatonin  3 mg Oral QHS   morphine  30 mg Oral Q12H   multivitamin with minerals  1 tablet Oral Daily   nystatin  5 mL Oral QID   pantoprazole  40 mg Oral Daily    Continuous Infusions:   LOS: 146 days   Marguerita Merles, DO [facility] Available via Epic secure chat 7am-7pm After these hours, please refer to coverage provider listed on [email or web address] 05/02/2023, 8:35 AM

## 2023-05-03 DIAGNOSIS — L03116 Cellulitis of left lower limb: Secondary | ICD-10-CM | POA: Diagnosis not present

## 2023-05-03 DIAGNOSIS — Z5900 Homelessness unspecified: Secondary | ICD-10-CM | POA: Diagnosis not present

## 2023-05-03 DIAGNOSIS — I96 Gangrene, not elsewhere classified: Secondary | ICD-10-CM | POA: Diagnosis not present

## 2023-05-03 DIAGNOSIS — L0390 Cellulitis, unspecified: Secondary | ICD-10-CM | POA: Diagnosis not present

## 2023-05-03 NOTE — Progress Notes (Signed)
PROGRESS NOTE    [name]  WUJ:[id number] DOB: Oct 15, 1966 DOA: 12/07/2022 PCP: [name], NP   Brief Narrative:  [name] is a 57 y.o. male with a history of heart failure, CAD, right BKA, COPD, hyperlipidemia, hyperlipidemia, polyneuropathy, schizoaffective/bipolar disorder, was admitted to the hospital with left lower extremity cellulitis and gangrene.   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      During hospitalization, patient was also noted to have maxillary abscess and periapical abscess and underwent oral surgery with I&D and of anterior maxillary abscess and surgical extraction of multiple teeth in alveoloplasty. Currently stable.   Hospital stay prolonged due to difficulty in placement due to homelessness status status post left BKA and right AKA.   [facility],  have offered to provide a prosthesis for LLE (the BKA)   The patient scheduled an appointment for this coming Monday (June 3rd, 2024) at noon to meet up with patient and the Prosthetist to cast the L residual limb and make a plan and patient to receive a prosthesis for his left leg next week pro bono.   Assessment and Plan:  Sepsis secondary to cellulitis/gangrene of left foot s/p Left BKA -Initially received vancomycin, Cefepime and Flagyl.  -Patient underwent 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   -Previous history of right below-knee amputation.    -Patient is wheelchair-bound.  04/27/2023: Stable.  Patient is awaiting disposition. -Patient had furosemide for stump edema and electrolytes were checked and potassium was low normal so we will replete -Discussed with physical therapy and patient is to get a prosthesis for his left side pro bono next week and Prosthetist consult has been placed; Plan is to meet with Prosthetist on Monday June 3rd at noon to cast for LLE mold. -PT assisting patient in donning LLE Shrinker and demonstrated  how to don shrinker on RLE    Anterior Maxillary Abscess -Status post oral surgery with I&D of anterior maxillary abscess, surgical extraction of teeth 4,5,6,11,12,13,20,21,22,27,28,29. Simple extraction teeth #7,8,9,10,23,24,25,26, Alveoloplasty x 4 quadrants on 03/25/23 Dr [name].   -Patient has completed Augmentin x 7 days.     -Currently on regular fluid consistency diet.  Continue oral hygiene and continue with benzocaine mucosal gel for the mouth and throat 4 times daily as needed for mild pain   Chronic Combined Systolic and Diastolic Heart failure -Compensated.   -Patient on Carvedilol 6.25 g p.o. twice daily, Amlodipine 10 mg p.o. daily, Furosemide 20 mg p.o. daily, and Isosorbide mononitrate 50 mg p.o. daily along with Hydralazine 25 mg every 8 -2D echocardiogram from 08/2022 with LV ejection fraction of 40% with global hypokinesis.  -Strict I's and O's and daily weights  Intake/Output Summary (Last 24 hours) at 05/03/2023 1122 Last data filed at 05/02/2023 1207 Gross per 24 hour  Intake 360 ml  Output 300 ml  Net 60 ml  -Continue diuresis with furosemide 20 mg po daily and continue monitor for signs and symptoms of volume overload  Essential Hypertension -on Carvedilol 6.25 g p.o. twice daily, Amlodipine 10 mg p.o. daily, Furosemide 20 mg p.o. daily, and Isosorbide Mononitrate 50 mg p.o. daily along with Hydralazine 25 mg every 8 -Continue to monitor blood pressures per protocol -Last BP reading was elevated at 138/90   COPD -Compensated. Continue Xopenex 0.63 mg nebs every 6 as needed  -Continue to monitor respiratory status carefully SpO2: 97 % O2 Flow Rate (L/min): 0 L/min   Anxiety -No longer appears  to be on Atarax but if necessary will reorder   GERD/GI prophylaxis -Continue Pantoprazole 40 mg po Daily    Constipation -Continue Bowel Regimen with MiraLAX , Dulcolax PRN.  On narcotics   Chronic pain -On MS Contin 30 mg every 12 scheduled and has oxycodone 10 mg p.o.  every 4 as needed for severe pain for 7-10 as well as tramadol 50 mg p.o. every 12 as needed for moderate pain and also on gabapentin 900 mg p.o. 3 times daily -To be supervised for concerns of previous hoarding medication. -Continue with bowel regimen with bisacodyl 5 mg p.o. daily as needed for moderate constipation, and MiraLAX 17 g p.o. daily as needed along with magnesium citrate 1 bottle once as needed for severe constipation   Schizoaffective Disorder -Seen by Psychiatry during hospitalization. -Continue Aripiprazole 5 mg p.o. daily and Escitalopram 5 mg p.o. Daily.     DVT prophylaxis: enoxaparin (LOVENOX) injection 40 mg Start: 02/25/23 2200 SCD's Start: 12/10/22 1731    Code Status: Full Code Family Communication: Friend at bedside  Disposition Plan:  Level of care: Med-Surg Status is: Inpatient Remains inpatient appropriate because: Has an unsafe discharge disposition; TOC assisting with discharge disposition    Consultants:  Psychiatry Orthopedic surgery Podiatry  Procedures:  As delineated as above  Antimicrobials:  Anti-infectives (From admission, onward)    Start     Dose/Rate Route Frequency Ordered Stop   03/19/23 0900  amoxicillin-clavulanate (AUGMENTIN) 875-125 MG per tablet 1 tablet        1 tablet Oral Every 12 hours 03/19/23 0809 04/01/23 2118   03/18/23 2000  Ampicillin-Sulbactam (UNASYN) 3 g in sodium chloride 0.9 % 100 mL IVPB  Status:  Discontinued        3 g 200 mL/hr over 30 Minutes Intravenous Every 6 hours 03/18/23 1939 03/19/23 0809   03/18/23 1645  Ampicillin-Sulbactam (UNASYN) 3 g in sodium chloride 0.9 % 100 mL IVPB  Status:  Discontinued        3 g 200 mL/hr over 30 Minutes Intravenous Every 6 hours 03/18/23 1555 03/18/23 1939   12/26/22 2200  doxycycline (VIBRA-TABS) tablet 100 mg        100 mg Oral Every 12 hours 12/26/22 1307 01/08/23 2126   12/24/22 1000  doxycycline (VIBRA-TABS) tablet 100 mg  Status:  Discontinued        100 mg Oral  Every 12 hours 12/24/22 0715 12/26/22 1307   12/10/22 1830  ceFAZolin (ANCEF) IVPB 2g/100 mL premix  Status:  Discontinued        2 g 200 mL/hr over 30 Minutes Intravenous Every 8 hours 12/10/22 1731 12/10/22 1740   12/10/22 1330  ceFAZolin (ANCEF) IVPB 2g/100 mL premix        2 g 200 mL/hr over 30 Minutes Intravenous On call to O.R. 12/10/22 0959 12/10/22 1534   12/08/22 0530  vancomycin (VANCOREADY) IVPB 1500 mg/300 mL        1,500 mg 150 mL/hr over 120 Minutes Intravenous Every 12 hours 12/07/22 1741 12/11/22 0955   12/08/22 0345  metroNIDAZOLE (FLAGYL) IVPB 500 mg        500 mg 100 mL/hr over 60 Minutes Intravenous Every 12 hours 12/07/22 1742 12/12/22 0344   12/07/22 2330  ceFEPIme (MAXIPIME) 2 g in sodium chloride 0.9 % 100 mL IVPB        2 g 200 mL/hr over 30 Minutes Intravenous Every 8 hours 12/07/22 1741 12/11/22 1529   12/07/22 2200  metroNIDAZOLE (FLAGYL) IVPB  500 mg  Status:  Discontinued        500 mg 100 mL/hr over 60 Minutes Intravenous Every 12 hours 12/07/22 2048 12/07/22 2056   12/07/22 1530  ceFEPIme (MAXIPIME) 2 g in sodium chloride 0.9 % 100 mL IVPB        2 g 200 mL/hr over 30 Minutes Intravenous  Once 12/07/22 1516 12/07/22 1734   12/07/22 1530  vancomycin (VANCOREADY) IVPB 1500 mg/300 mL        1,500 mg 150 mL/hr over 120 Minutes Intravenous  Once 12/07/22 1516 12/09/22 0333   12/07/22 1515  metroNIDAZOLE (FLAGYL) IVPB 500 mg        500 mg 100 mL/hr over 60 Minutes Intravenous  Once 12/07/22 1506 12/07/22 1734   12/07/22 1500  ceFAZolin (ANCEF) IVPB 1 g/50 mL premix  Status:  Discontinued        1 g 100 mL/hr over 30 Minutes Intravenous  Once 12/07/22 1452 12/07/22 1513       Subjective: Seen and examined at bedside and is sitting in his wheelchair and speak with his friend.  Continue to mobilize with his wheelchair.  No nausea or vomiting.  No chest pain or shortness breath.  States that he is doing fairly well.  Gets stump Theodosia Quay off today.  No other  concerns or complaints at this time.  Objective: Vitals:   05/02/23 0759 05/02/23 1559 05/02/23 1948 05/03/23 0803  BP: (!) 165/100 (!) 158/97 (!) 163/101 (!) 138/90  Pulse: (!) 58 77 71 64  Resp: 17 20 17 18   Temp:  98.2 F (36.8 C) 97.7 F (36.5 C) 97.9 F (36.6 C)  TempSrc:  Oral Oral Oral  SpO2: 96% 97% 96% 97%  Weight:      Height:        Intake/Output Summary (Last 24 hours) at 05/03/2023 1123 Last data filed at 05/02/2023 1207 Gross per 24 hour  Intake 360 ml  Output 300 ml  Net 60 ml   Filed Weights   04/28/23 0500 04/29/23 0500  Weight: 89.8 kg 91.3 kg   Examination: Physical Exam:  Constitutional: WN/WD overweight chronically ill-appearing African-American male in no acute distress appears calm Respiratory: Diminished to auscultation bilaterally, no wheezing, rales, rhonchi or crackles. Normal respiratory effort and patient is not tachypenic. No accessory muscle use.  Unlabored breathing Cardiovascular: RRR, no murmurs / rubs / gallops. S1 and S2 auscultated.  Has some slight extremity edema on the left stump and surgical incision scar appear clean dry and intact Abdomen: Soft, non-tender, distended secondary to body habitus. Bowel sounds positive.  GU: Deferred. Musculoskeletal: No clubbing / cyanosis of digits/nails.  Has a left BKA and a right AKA Skin: No rashes, lesions, ulcers on limited skin evaluation. No induration; Warm and dry.  Neurologic: CN 2-12 grossly intact with no focal deficits. Romberg sign and cerebellar reflexes not assessed.  Psychiatric: Normal judgment and insight. Alert and oriented x 3. Normal mood and appropriate affect.   Data Reviewed: I have personally reviewed following labs and imaging studies  CBC: Recent Labs  Lab 04/29/23 0940  WBC 5.1  NEUTROABS 1.8  HGB 14.9  HCT 44.4  MCV 89.2  PLT 244   Basic Metabolic Panel: Recent Labs  Lab 04/29/23 0940  NA 138  K 3.5  CL 104  CO2 24  GLUCOSE 71  BUN 16  CREATININE 0.85   CALCIUM 9.4  MG 2.1  PHOS 3.2   GFR: Estimated Creatinine Clearance: 109.6 mL/min (by C-G  formula based on SCr of 0.85 mg/dL). Liver Function Tests: Recent Labs  Lab 04/29/23 0940  AST 24  ALT 22  ALKPHOS 68  BILITOT 1.0  PROT 7.5  ALBUMIN 3.8   No results for input(s): "LIPASE", "AMYLASE" in the last 168 hours. No results for input(s): "AMMONIA" in the last 168 hours. Coagulation Profile: No results for input(s): "INR", "PROTIME" in the last 168 hours. Cardiac Enzymes: No results for input(s): "CKTOTAL", "CKMB", "CKMBINDEX", "TROPONINI" in the last 168 hours. BNP (last 3 results) No results for input(s): "PROBNP" in the last 8760 hours. HbA1C: No results for input(s): "HGBA1C" in the last 72 hours. CBG: No results for input(s): "GLUCAP" in the last 168 hours. Lipid Profile: No results for input(s): "CHOL", "HDL", "LDLCALC", "TRIG", "CHOLHDL", "LDLDIRECT" in the last 72 hours. Thyroid Function Tests: No results for input(s): "TSH", "T4TOTAL", "FREET4", "T3FREE", "THYROIDAB" in the last 72 hours. Anemia Panel: No results for input(s): "VITAMINB12", "FOLATE", "FERRITIN", "TIBC", "IRON", "RETICCTPCT" in the last 72 hours. Sepsis Labs: No results for input(s): "PROCALCITON", "LATICACIDVEN" in the last 168 hours.  No results found for this or any previous visit (from the past 240 hour(s)).   Radiology Studies: No results found.  Scheduled Meds:  amLODipine  10 mg Oral Daily   ARIPiprazole  5 mg Oral Daily   vitamin C  1,000 mg Oral Daily   carvedilol  6.25 mg Oral BID WC   chlorhexidine  15 mL Mouth/Throat TID   enoxaparin (LOVENOX) injection  40 mg Subcutaneous Q24H   escitalopram  5 mg Oral Daily   furosemide  20 mg Oral Daily   gabapentin  900 mg Oral TID   hydrALAZINE  25 mg Oral Q8H   isosorbide mononitrate  15 mg Oral Daily   melatonin  3 mg Oral QHS   morphine  30 mg Oral Q12H   multivitamin with minerals  1 tablet Oral Daily   nystatin  5 mL Oral QID    pantoprazole  40 mg Oral Daily   Continuous Infusions:   LOS: 147 days   Marguerita Merles, DO [facility] Available via Epic secure chat 7am-7pm After these hours, please refer to coverage provider listed on [email or web address] 05/03/2023, 11:23 AM

## 2023-05-04 DIAGNOSIS — L0390 Cellulitis, unspecified: Secondary | ICD-10-CM | POA: Diagnosis not present

## 2023-05-04 DIAGNOSIS — Z5900 Homelessness unspecified: Secondary | ICD-10-CM | POA: Diagnosis not present

## 2023-05-04 DIAGNOSIS — I96 Gangrene, not elsewhere classified: Secondary | ICD-10-CM | POA: Diagnosis not present

## 2023-05-04 DIAGNOSIS — L03116 Cellulitis of left lower limb: Secondary | ICD-10-CM | POA: Diagnosis not present

## 2023-05-04 NOTE — Plan of Care (Signed)
  Problem: Pain Managment: Goal: General experience of comfort will improve Outcome: Progressing   Problem: Safety: Goal: Ability to remain free from injury will improve Outcome: Progressing   Problem: Skin Integrity: Goal: Risk for impaired skin integrity will decrease Outcome: Progressing   Problem: Education: Goal: Knowledge of General Education information will improve Description: Including pain rating scale, medication(s)/side effects and non-pharmacologic comfort measures Outcome: Progressing   

## 2023-05-04 NOTE — Progress Notes (Signed)
PROGRESS NOTE    [name]  WUJ:[id number] DOB: 03/19/1966 DOA: 12/07/2022 PCP: [name], NP   Brief Narrative:  [name] is a 57 y.o. male with a history of heart failure, CAD, right BKA, COPD, hyperlipidemia, hyperlipidemia, polyneuropathy, schizoaffective/bipolar disorder, was admitted to the hospital with left lower extremity cellulitis and gangrene.   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      During hospitalization, patient was also noted to have maxillary abscess and periapical abscess and underwent oral surgery with I&D and of anterior maxillary abscess and surgical extraction of multiple teeth in alveoloplasty. Currently stable.   Hospital stay prolonged due to difficulty in placement due to homelessness status status post left BKA and right AKA.   [facility],  have offered to provide a prosthesis for LLE (the BKA)   The patient scheduled an appointment today Monday (June 3rd, 2024) at noon to meet up with patient and the Prosthetist to cast the L residual limb and make a plan and patient to receive a prosthesis for his left leg pro bono.   Assessment and Plan:  Sepsis secondary to cellulitis/gangrene of left foot s/p Left BKA -Initially received vancomycin, Cefepime and Flagyl.  -Patient underwent 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   -Previous history of right below-knee amputation.    -Patient is wheelchair-bound.  04/27/2023: Stable.  Patient is awaiting disposition. -Patient had furosemide for stump edema and electrolytes were checked and potassium was low normal so we will replete -Discussed with physical therapy and patient is to get a prosthesis for his left side pro bono next week and Prosthetist consult has been placed; Plan is to meet with Prosthetist on Monday June 3rd at noon to cast for LLE mold. -PT assisting patient in donning LLE Shrinker and demonstrated how to don shrinker on  RLE    Anterior Maxillary Abscess -Status post oral surgery with I&D of anterior maxillary abscess, surgical extraction of teeth 4,5,6,11,12,13,20,21,22,27,28,29. Simple extraction teeth #7,8,9,10,23,24,25,26, Alveoloplasty x 4 quadrants on 03/25/23 Dr [name].   -Patient has completed Augmentin x 7 days.     -Currently on regular fluid consistency diet.  Continue oral hygiene and continue with benzocaine mucosal gel for the mouth and throat 4 times daily as needed for mild pain   Chronic Combined Systolic and Diastolic Heart failure -Compensated.   -Patient on Carvedilol 6.25 g p.o. twice daily, Amlodipine 10 mg p.o. daily, Furosemide 20 mg p.o. daily, and Isosorbide mononitrate 50 mg p.o. daily along with Hydralazine 25 mg every 8 -2D echocardiogram from 08/2022 with LV ejection fraction of 40% with global hypokinesis.  -Strict I's and O's and daily weights No intake or output data in the 24 hours ending 05/04/23 1130 -Continue diuresis with furosemide 20 mg po daily and continue monitor for signs and symptoms of volume overload  Essential Hypertension -on Carvedilol 6.25 g p.o. twice daily, Amlodipine 10 mg p.o. daily, Furosemide 20 mg p.o. daily, and Isosorbide Mononitrate 50 mg p.o. daily along with Hydralazine 25 mg every 8 -Continue to monitor blood pressures per protocol -Last BP reading was elevated at 110/76   COPD -Compensated. Continue Xopenex 0.63 mg nebs every 6 as needed  -Continue to monitor respiratory status carefully SpO2: 100 % O2 Flow Rate (L/min): 0 L/min   Anxiety -No longer appears to be on Atarax but if necessary will reorder   GERD/GI prophylaxis -Continue Pantoprazole 40 mg po Daily    Constipation -Continue  Bowel Regimen with MiraLAX , Dulcolax PRN.  On narcotics   Chronic pain -On MS Contin 30 mg every 12 scheduled and has oxycodone 10 mg p.o. every 4 as needed for severe pain for 7-10 as well as tramadol 50 mg p.o. every 12 as needed for moderate pain and  also on gabapentin 900 mg p.o. 3 times daily -To be supervised for concerns of previous hoarding medication. -Continue with bowel regimen with bisacodyl 5 mg p.o. daily as needed for moderate constipation, and MiraLAX 17 g p.o. daily as needed along with magnesium citrate 1 bottle once as needed for severe constipation   Schizoaffective Disorder -Seen by Psychiatry during hospitalization. -Continue Aripiprazole 5 mg p.o. daily and Escitalopram 5 mg p.o. Daily.    Obesity -Complicates overall prognosis and care -Estimated body mass index is 30.08 kg/m as calculated from the following:   Height as of this encounter: 5\' 9"  (1.753 m).   Weight as of this encounter: 92.4 kg.  -Weight Loss and Dietary Counseling given    DVT prophylaxis: enoxaparin (LOVENOX) injection 40 mg Start: 02/25/23 2200 SCD's Start: 12/10/22 1731    Code Status: Full Code Family Communication: Discussed with friend at bedside  Disposition Plan:  Level of care: Med-Surg Status is: Inpatient Remains inpatient appropriate because: Has an unsafe discharge disposition; TOC assisting with discharge disposition   Consultants:  Psychiatry Orthopedic surgery Podiatry  Procedures:  As delineated as above  Antimicrobials:  Anti-infectives (From admission, onward)    Start     Dose/Rate Route Frequency Ordered Stop   03/19/23 0900  amoxicillin-clavulanate (AUGMENTIN) 875-125 MG per tablet 1 tablet        1 tablet Oral Every 12 hours 03/19/23 0809 04/01/23 2118   03/18/23 2000  Ampicillin-Sulbactam (UNASYN) 3 g in sodium chloride 0.9 % 100 mL IVPB  Status:  Discontinued        3 g 200 mL/hr over 30 Minutes Intravenous Every 6 hours 03/18/23 1939 03/19/23 0809   03/18/23 1645  Ampicillin-Sulbactam (UNASYN) 3 g in sodium chloride 0.9 % 100 mL IVPB  Status:  Discontinued        3 g 200 mL/hr over 30 Minutes Intravenous Every 6 hours 03/18/23 1555 03/18/23 1939   12/26/22 2200  doxycycline (VIBRA-TABS) tablet 100 mg         100 mg Oral Every 12 hours 12/26/22 1307 01/08/23 2126   12/24/22 1000  doxycycline (VIBRA-TABS) tablet 100 mg  Status:  Discontinued        100 mg Oral Every 12 hours 12/24/22 0715 12/26/22 1307   12/10/22 1830  ceFAZolin (ANCEF) IVPB 2g/100 mL premix  Status:  Discontinued        2 g 200 mL/hr over 30 Minutes Intravenous Every 8 hours 12/10/22 1731 12/10/22 1740   12/10/22 1330  ceFAZolin (ANCEF) IVPB 2g/100 mL premix        2 g 200 mL/hr over 30 Minutes Intravenous On call to O.R. 12/10/22 0959 12/10/22 1534   12/08/22 0530  vancomycin (VANCOREADY) IVPB 1500 mg/300 mL        1,500 mg 150 mL/hr over 120 Minutes Intravenous Every 12 hours 12/07/22 1741 12/11/22 0955   12/08/22 0345  metroNIDAZOLE (FLAGYL) IVPB 500 mg        500 mg 100 mL/hr over 60 Minutes Intravenous Every 12 hours 12/07/22 1742 12/12/22 0344   12/07/22 2330  ceFEPIme (MAXIPIME) 2 g in sodium chloride 0.9 % 100 mL IVPB  2 g 200 mL/hr over 30 Minutes Intravenous Every 8 hours 12/07/22 1741 12/11/22 1529   12/07/22 2200  metroNIDAZOLE (FLAGYL) IVPB 500 mg  Status:  Discontinued        500 mg 100 mL/hr over 60 Minutes Intravenous Every 12 hours 12/07/22 2048 12/07/22 2056   12/07/22 1530  ceFEPIme (MAXIPIME) 2 g in sodium chloride 0.9 % 100 mL IVPB        2 g 200 mL/hr over 30 Minutes Intravenous  Once 12/07/22 1516 12/07/22 1734   12/07/22 1530  vancomycin (VANCOREADY) IVPB 1500 mg/300 mL        1,500 mg 150 mL/hr over 120 Minutes Intravenous  Once 12/07/22 1516 12/09/22 0333   12/07/22 1515  metroNIDAZOLE (FLAGYL) IVPB 500 mg        500 mg 100 mL/hr over 60 Minutes Intravenous  Once 12/07/22 1506 12/07/22 1734   12/07/22 1500  ceFAZolin (ANCEF) IVPB 1 g/50 mL premix  Status:  Discontinued        1 g 100 mL/hr over 30 Minutes Intravenous  Once 12/07/22 1452 12/07/22 1513       Subjective: Seen And examined at bedside and was doing okay today.  States that his leg sometimes intermittently shakes in  the middle of the night uncontrollably.  No nausea or vomiting.  Denies any chest pain or shortness of breath.  Felt okay and had no complaints.  Objective: Vitals:   05/03/23 0803 05/03/23 1421 05/04/23 0500 05/04/23 0926  BP: (!) 138/90 110/76  (!) 160/124  Pulse: 64 77  71  Resp: 18 18  18   Temp: 97.9 F (36.6 C)   98 F (36.7 C)  TempSrc: Oral     SpO2: 97% 97%  100%  Weight:   92.4 kg   Height:       No intake or output data in the 24 hours ending 05/04/23 1134 Filed Weights   04/28/23 0500 04/29/23 0500 05/04/23 0500  Weight: 89.8 kg 91.3 kg 92.4 kg   Examination: Physical Exam:  Constitutional: WN/WD overweight Chronically ill-appearing AAM in NAD appears calm Respiratory: Diminished to auscultation bilaterally, no wheezing, rales, rhonchi or crackles. Normal respiratory effort and patient is not tachypenic. No accessory muscle use.  Unlabored breathing Cardiovascular: RRR, no murmurs / rubs / gallops. S1 and S2 auscultated.  Some slight extremity edema on the left stump Abdomen: Soft, non-tender, distended secondary to body habitus. Bowel sounds positive.  GU: Deferred. Musculoskeletal: No clubbing / cyanosis of digits/nails.  Has a right AKA and a left BKA Skin: No rashes, lesions, ulcers on limited skin evaluation. No induration; Warm and dry.  Neurologic: CN 2-12 grossly intact with no focal deficits. Romberg sign and cerebellar reflexes not assessed.  Psychiatric: Normal judgment and insight. Alert and oriented x 3. Normal mood and appropriate affect.   Data Reviewed: I have personally reviewed following labs and imaging studies  CBC: Recent Labs  Lab 04/29/23 0940  WBC 5.1  NEUTROABS 1.8  HGB 14.9  HCT 44.4  MCV 89.2  PLT 244   Basic Metabolic Panel: Recent Labs  Lab 04/29/23 0940  NA 138  K 3.5  CL 104  CO2 24  GLUCOSE 71  BUN 16  CREATININE 0.85  CALCIUM 9.4  MG 2.1  PHOS 3.2   GFR: Estimated Creatinine Clearance: 110.3 mL/min (by C-G  formula based on SCr of 0.85 mg/dL). Liver Function Tests: Recent Labs  Lab 04/29/23 0940  AST 24  ALT 22  ALKPHOS  68  BILITOT 1.0  PROT 7.5  ALBUMIN 3.8   No results for input(s): "LIPASE", "AMYLASE" in the last 168 hours. No results for input(s): "AMMONIA" in the last 168 hours. Coagulation Profile: No results for input(s): "INR", "PROTIME" in the last 168 hours. Cardiac Enzymes: No results for input(s): "CKTOTAL", "CKMB", "CKMBINDEX", "TROPONINI" in the last 168 hours. BNP (last 3 results) No results for input(s): "PROBNP" in the last 8760 hours. HbA1C: No results for input(s): "HGBA1C" in the last 72 hours. CBG: No results for input(s): "GLUCAP" in the last 168 hours. Lipid Profile: No results for input(s): "CHOL", "HDL", "LDLCALC", "TRIG", "CHOLHDL", "LDLDIRECT" in the last 72 hours. Thyroid Function Tests: No results for input(s): "TSH", "T4TOTAL", "FREET4", "T3FREE", "THYROIDAB" in the last 72 hours. Anemia Panel: No results for input(s): "VITAMINB12", "FOLATE", "FERRITIN", "TIBC", "IRON", "RETICCTPCT" in the last 72 hours. Sepsis Labs: No results for input(s): "PROCALCITON", "LATICACIDVEN" in the last 168 hours.  No results found for this or any previous visit (from the past 240 hour(s)).   Radiology Studies: No results found.  Scheduled Meds:  amLODipine  10 mg Oral Daily   ARIPiprazole  5 mg Oral Daily   vitamin C  1,000 mg Oral Daily   carvedilol  6.25 mg Oral BID WC   chlorhexidine  15 mL Mouth/Throat TID   enoxaparin (LOVENOX) injection  40 mg Subcutaneous Q24H   escitalopram  5 mg Oral Daily   furosemide  20 mg Oral Daily   gabapentin  900 mg Oral TID   hydrALAZINE  25 mg Oral Q8H   isosorbide mononitrate  15 mg Oral Daily   melatonin  3 mg Oral QHS   morphine  30 mg Oral Q12H   multivitamin with minerals  1 tablet Oral Daily   nystatin  5 mL Oral QID   pantoprazole  40 mg Oral Daily   Continuous Infusions:   LOS: 148 days   Marguerita Merles,  DO [facility] Available via Epic secure chat 7am-7pm After these hours, please refer to coverage provider listed on [email or web address] 05/04/2023, 11:34 AM

## 2023-05-04 NOTE — Progress Notes (Addendum)
Physical Therapy Treatment Patient Details Name: [name] MRN: [id number] DOB: 20-Jul-1966 Today's Date: 05/04/2023   History of Present Illness Pt is a 57 y/o M who presented 12/17/22 with L ankle cellulitis and underwent L BKA on 1/10. Oral abscesses noted, underwent oral surgery Wed, 4/24; PMH includes asthma, bioplar 1, depression, schizoaffective disorder, CVA, chronic CHF, homelessness, HTN, DM2, cocaine and cannabis use, and R knee disarticulation    PT Comments    Continuing work on functional mobility and activity tolerance;  Focus of session was to work with prosthetist to start work on getting his LLE prosthesis; Assisted in casting [name]'s L residual limb; [name] (and this PT) the opportunity to ask questions; Prosthetist will bring an initial test socket next week (Monday, 6/10 at noon), if revisions are needed, will bring a second socket the next week (Mondays at noon seem to be good times for sessions with Prosthetist);   [name] is to wear his shrinker at all times except when bathing; he can wear the shrinker with 2 layers to above his knee, or with 1 layer to above the knee and 3 to 4 layers more distally to apply more pressure for distal limb shaping;   When his liner is delivered, [name] will start wearing the liner during waking hours (and continue shrinker wear at night); [name] was educated that liner wear takes time to get used to (can bring about temp regulation changes, and of course more pressure at the residual limb; will start with 2-3 hours of wear, and add an hour of wear each day until he gets used to wearing the liner during most/all of waking hours;    [name] verbalized understanding to all the above: first steps to getting prosthesis made, shrinker wear, and initial liner wear; PT and Prosthetist also re-emphasized importance of strengthening hip and knee extensors and stretching hip and knee flexors;   [name] had some questions about his RLE, and as PT and  Prosthetist looked further and palpated his R residual limb, noted tenderness at his distal end of the R limb that one wouldn't expect; with the presence of his patella, we inferred that his R LE actually had a knee disarticulation instead of an AKA; Given the distal end of the femur is quite weight bearing, it was surprising that [name] does not tolerate pressure through the end of his RLE; He had quite a pain-startle response to palpation there; Pain response seemed like a neuroma possibly, or, we wonder if there is any proximal tibia present in his knee (one can visibly see a component of motion RLE with knee flex/ext); if there is any tibia present in his knee, it will make fitting a r prosthesis much more complicated;   Worth considering getting OR report from his RLE amputation done in [place], or consider getting an xray of his R residual limb   Recommendations for follow up therapy are one component of a multi-disciplinary discharge planning process, led by the attending physician.  Recommendations may be updated based on patient status, additional functional criteria and insurance authorization.  Follow Up Recommendations  Can patient physically be transported by private vehicle:  (perhaps soon; [name] asked about adding a car transfer)    Assistance Recommended at Discharge PRN  Patient can return home with the following Assist for transportation   Equipment Recommendations   (tub bench)    Recommendations for Other Services  (Recommend starting the Mental/Behavioral Health support through [name] to start while [name] is here in  the hospital; [name] consult)     Precautions / Restrictions Precautions Precautions: None Precaution Comments: R knee disrticulation, L BKA; Fall risk is present, but minimal Restrictions RLE Weight Bearing: Weight bearing as tolerated LLE Weight Bearing: Weight bearing as tolerated                                                     Cognition Arousal/Alertness: Awake/alert Behavior During Therapy: WFL for tasks assessed/performed Overall Cognitive Status: Within Functional Limits for tasks assessed                                 General Comments: When [name] understands rationale for an exercise or activity, and that rationale is in line with his goals, he participates much better        Exercises      General Comments        Pertinent Vitals/Pain Pain Assessment Pain Assessment: Faces Faces Pain Scale: Hurts even more Pain Location: Distal R residual limb -- pain-startle response when palpating distal limb RLE; pain seems to zip up quickly and subside quickly Pain Descriptors / Indicators: Grimacing, Tender (Gave a startle reaction) Pain Intervention(s): Monitored during session, Other (comment) (Discussed with Prosthetist; see PT comments)    Home Living                          Prior Function            PT Goals (current goals can now be found in the care plan section) Acute Rehab PT Goals Patient Stated Goal: Prepare for prosthetics fitting; Would like to get housing in [facility] through the Transition to [employer] PT Goal Formulation: With patient Time For Goal Achievement: 05/19/23 Potential to Achieve Goals: Good Progress towards PT goals: Progressing toward goals  Added a car transfer goal   Frequency    Min 1X/week      PT Plan Current plan remains appropriate;Frequency needs to be updated (When prosthesis training begins, will incr frequency)    Co-evaluation              AM-PAC PT "6 Clicks" Mobility   Outcome Measure  Help needed turning from your back to your side while in a flat bed without using bedrails?: None Help needed moving from lying on your back to sitting on the side of a flat bed without using bedrails?: None Help needed moving to and from a bed to a chair (including a wheelchair)?: None Help needed  standing up from a chair using your arms (e.g., wheelchair or bedside chair)?: Total Help needed to walk in hospital room?: Total Help needed climbing 3-5 steps with a railing? : Total 6 Click Score: 15    End of Session Equipment Utilized During Treatment: Other (comment) (shrinkers provided by [facility]) Activity Tolerance: Patient tolerated treatment well Patient left:  (managing independently) Nurse Communication: Mobility status PT Visit Diagnosis: Muscle weakness (generalized) (M62.81);Other abnormalities of gait and mobility (R26.89)     Time: 1610-9604 PT Time Calculation (min) (ACUTE ONLY): 27 min  Charges:  $Therapeutic Activity: 8-22 mins $Prosthetics Training: 8-21mins                     [employer]  Marylu Lund, PT  Acute Rehabilitation Services Office [phone] Secure Chat welcomed    [name] 05/04/2023, 6:47 PM

## 2023-05-05 DIAGNOSIS — L0390 Cellulitis, unspecified: Secondary | ICD-10-CM | POA: Diagnosis not present

## 2023-05-05 DIAGNOSIS — Z5900 Homelessness unspecified: Secondary | ICD-10-CM | POA: Diagnosis not present

## 2023-05-05 DIAGNOSIS — L03116 Cellulitis of left lower limb: Secondary | ICD-10-CM | POA: Diagnosis not present

## 2023-05-05 DIAGNOSIS — I96 Gangrene, not elsewhere classified: Secondary | ICD-10-CM | POA: Diagnosis not present

## 2023-05-05 NOTE — Progress Notes (Signed)
PROGRESS NOTE    [name]  ZOX:[id number] DOB: 02/27/1966 DOA: 12/07/2022 PCP: [name], NP   Brief Narrative:  [name] is a 57 y.o. male with a history of heart failure, CAD, right BKA, COPD, hyperlipidemia, hyperlipidemia, polyneuropathy, schizoaffective/bipolar disorder, was admitted to the hospital with left lower extremity cellulitis and gangrene.   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      During hospitalization, patient was also noted to have maxillary abscess and periapical abscess and underwent oral surgery with I&D and of anterior maxillary abscess and surgical extraction of multiple teeth in alveoloplasty. Currently stable.   Hospital stay prolonged due to difficulty in placement due to homelessness status status post left BKA and right AKA.   [facility],  have offered to provide a prosthesis for LLE (the BKA)   The patient scheduled an appointment Monday (June 3rd, 2024) at noon to meet up with patient and the Prosthetist to cast the L residual limb and make a plan and patient to receive a prosthesis for his left leg pro bono.  **Patient met with the Prosthetist for his Left Leg who brought initial test socket and will bring another test socket next week and the patient is to wear the shrinker at all times except when bathing and can wear the shrinker with 2 layers to above his knee or with 1 layer above the knee in 3 to 4 layers more distally to apply more pressure for distal limb shaping.  Per the physical therapist he is to wear the liner during waking hours with initially starting 2 to 3 hours aware and then adding half an hour of wear each day until the patient gets used to wearing the liner.   Assessment and Plan:  Sepsis secondary to cellulitis/gangrene of left foot s/p Left BKA -Initially received vancomycin, Cefepime and Flagyl.  -Patient underwent left BKA on 12/10/2022 by orthopedic  surgery.   -Previous history of right below-knee amputation.    -Patient is wheelchair-bound.  04/27/2023: Stable.  Patient is awaiting disposition. -Patient had furosemide for stump edema and electrolytes were checked and potassium was low normal so we will replete -Discussed with physical therapy and patient is to get a prosthesis for his left side pro bono next week and Prosthetist consult has been placed; Plan is to meet with Prosthetist on Monday June 3rd at noon to cast for LLE mold. -PT assisting patient in donning LLE Shrinker and demonstrated how to don shrinker on RLE    Anterior Maxillary Abscess -Status post oral surgery with I&D of anterior maxillary abscess, surgical extraction of teeth 4,5,6,11,12,13,20,21,22,27,28,29. Simple extraction teeth #7,8,9,10,23,24,25,26, Alveoloplasty x 4 quadrants on 03/25/23 Dr [name].   -Patient has completed Augmentin x 7 days.     -Currently on regular fluid consistency diet.  Continue oral hygiene and continue with benzocaine mucosal gel for the mouth and throat 4 times daily as needed for mild pain   Chronic Combined Systolic and Diastolic Heart failure -Compensated.   -Patient on Carvedilol 6.25 g p.o. twice daily, Amlodipine 10 mg p.o. daily, Furosemide 20 mg p.o. daily, and Isosorbide mononitrate 50 mg p.o. daily along with Hydralazine 25 mg every 8 -2D echocardiogram from 08/2022 with LV ejection fraction of 40% with global hypokinesis.  -Strict I's and O's and daily weights No intake or output data in the 24 hours ending 05/04/23 1130 -Continue diuresis with furosemide 20 mg po daily and continue monitor for signs and  symptoms of volume overload  Essential Hypertension -on Carvedilol 6.25 g p.o. twice daily, Amlodipine 10 mg p.o. daily, Furosemide 20 mg p.o. daily, and Isosorbide Mononitrate 50 mg p.o. daily along with Hydralazine 25 mg every 8 -Continue to monitor blood pressures per protocol -Last BP reading was elevated at 138/98    COPD -Compensated. Continue Xopenex 0.63 mg nebs every 6 as needed  -Continue to monitor respiratory status carefully SpO2: 100 % O2 Flow Rate (L/min): 0 L/min   Anxiety -No longer appears to be on Atarax but if necessary will reorder   GERD/GI prophylaxis -Does not want to take Pantoprazole 40 mg po Daily so Discontinued     Constipation -Continue Bowel Regimen with MiraLAX , Dulcolax PRN.  On narcotics   Chronic pain -On MS Contin 30 mg every 12 scheduled and has oxycodone 10 mg p.o. every 4 as needed for severe pain for 7-10 as well as tramadol 50 mg p.o. every 12 as needed for moderate pain and also on gabapentin 900 mg p.o. 3 times daily -To be supervised for concerns of previous hoarding medication. -Continue with bowel regimen with bisacodyl 5 mg p.o. daily as needed for moderate constipation, and MiraLAX 17 g p.o. daily as needed along with magnesium citrate 1 bottle once as needed for severe constipation   Schizoaffective Disorder -Seen by Psychiatry during hospitalization. -Continue Aripiprazole 5 mg p.o. daily and Escitalopram 5 mg p.o. Daily.    Obesity -Complicates overall prognosis and care -Estimated body mass index is 30.08 kg/m as calculated from the following:   Height as of this encounter: 5\' 9"  (1.753 m).   Weight as of this encounter: 92.4 kg.  -Weight Loss and Dietary Counseling given   DVT prophylaxis: enoxaparin (LOVENOX) injection 40 mg Start: 02/25/23 2200 SCD's Start: 12/10/22 1731    Code Status: Full Code Family Communication: Discussed with friend at bedside  Disposition Plan:  Level of care: Med-Surg Status is: Inpatient Remains inpatient appropriate because: Has an unsafe discharge disposition and TOC assisting with discharge disposition   Consultants:  Psychiatry Orthopedic surgery Podiatry  Procedures:  As delineated as above  Antimicrobials:  Anti-infectives (From admission, onward)    Start     Dose/Rate Route Frequency  Ordered Stop   03/19/23 0900  amoxicillin-clavulanate (AUGMENTIN) 875-125 MG per tablet 1 tablet        1 tablet Oral Every 12 hours 03/19/23 0809 04/01/23 2118   03/18/23 2000  Ampicillin-Sulbactam (UNASYN) 3 g in sodium chloride 0.9 % 100 mL IVPB  Status:  Discontinued        3 g 200 mL/hr over 30 Minutes Intravenous Every 6 hours 03/18/23 1939 03/19/23 0809   03/18/23 1645  Ampicillin-Sulbactam (UNASYN) 3 g in sodium chloride 0.9 % 100 mL IVPB  Status:  Discontinued        3 g 200 mL/hr over 30 Minutes Intravenous Every 6 hours 03/18/23 1555 03/18/23 1939   12/26/22 2200  doxycycline (VIBRA-TABS) tablet 100 mg        100 mg Oral Every 12 hours 12/26/22 1307 01/08/23 2126   12/24/22 1000  doxycycline (VIBRA-TABS) tablet 100 mg  Status:  Discontinued        100 mg Oral Every 12 hours 12/24/22 0715 12/26/22 1307   12/10/22 1830  ceFAZolin (ANCEF) IVPB 2g/100 mL premix  Status:  Discontinued        2 g 200 mL/hr over 30 Minutes Intravenous Every 8 hours 12/10/22 1731 12/10/22 1740   12/10/22 1330  ceFAZolin (ANCEF) IVPB 2g/100 mL premix        2 g 200 mL/hr over 30 Minutes Intravenous On call to O.R. 12/10/22 0959 12/10/22 1534   12/08/22 0530  vancomycin (VANCOREADY) IVPB 1500 mg/300 mL        1,500 mg 150 mL/hr over 120 Minutes Intravenous Every 12 hours 12/07/22 1741 12/11/22 0955   12/08/22 0345  metroNIDAZOLE (FLAGYL) IVPB 500 mg        500 mg 100 mL/hr over 60 Minutes Intravenous Every 12 hours 12/07/22 1742 12/12/22 0344   12/07/22 2330  ceFEPIme (MAXIPIME) 2 g in sodium chloride 0.9 % 100 mL IVPB        2 g 200 mL/hr over 30 Minutes Intravenous Every 8 hours 12/07/22 1741 12/11/22 1529   12/07/22 2200  metroNIDAZOLE (FLAGYL) IVPB 500 mg  Status:  Discontinued        500 mg 100 mL/hr over 60 Minutes Intravenous Every 12 hours 12/07/22 2048 12/07/22 2056   12/07/22 1530  ceFEPIme (MAXIPIME) 2 g in sodium chloride 0.9 % 100 mL IVPB        2 g 200 mL/hr over 30 Minutes Intravenous   Once 12/07/22 1516 12/07/22 1734   12/07/22 1530  vancomycin (VANCOREADY) IVPB 1500 mg/300 mL        1,500 mg 150 mL/hr over 120 Minutes Intravenous  Once 12/07/22 1516 12/09/22 0333   12/07/22 1515  metroNIDAZOLE (FLAGYL) IVPB 500 mg        500 mg 100 mL/hr over 60 Minutes Intravenous  Once 12/07/22 1506 12/07/22 1734   12/07/22 1500  ceFAZolin (ANCEF) IVPB 1 g/50 mL premix  Status:  Discontinued        1 g 100 mL/hr over 30 Minutes Intravenous  Once 12/07/22 1452 12/07/22 1513       Subjective: Seen and examined at bedside and he was doing okay.  States that he does not want Protonix and more so this was discontinued.  Denies any nausea or vomiting.  States that he slept fairly well last night.  No other concerns or complaints at this time and continues to use his wheelchair to mobilize.  Objective: Vitals:   05/04/23 0500 05/04/23 0926 05/05/23 0812 05/05/23 1437  BP:  (!) 160/124 (!) 153/93 (!) 138/98  Pulse:  71 78 67  Resp:  18 17 17   Temp:  98 F (36.7 C) 98.2 F (36.8 C) 98.1 F (36.7 C)  TempSrc:   Oral Oral  SpO2:  100% 100% 100%  Weight: 92.4 kg     Height:       No intake or output data in the 24 hours ending 05/05/23 1621 Filed Weights   04/28/23 0500 04/29/23 0500 05/04/23 0500  Weight: 89.8 kg 91.3 kg 92.4 kg   Examination: Physical Exam:  Constitutional: WN/WD chronically ill-appearing African-American male in no acute distress appears calm Respiratory: Diminished to auscultation bilaterally, no wheezing, rales, rhonchi or crackles. Normal respiratory effort and patient is not tachypenic. No accessory muscle use.  Unlabored breathing Cardiovascular: RRR, no murmurs / rubs / gallops. S1 and S2 auscultated.  Wearing a stump shrinker Abdomen: Soft, non-tender, distended secondary to body habitus.. Bowel sounds positive.  GU: Deferred. Musculoskeletal: No clubbing / cyanosis of digits/nails.  Has a right AKA and a left BKA Skin: No rashes, lesions, ulcers  limited skin evaluation. No induration; Warm and dry.  Neurologic: CN 2-12 grossly intact with no focal deficits.  Romberg sign and cerebellar reflexes not assessed.  Psychiatric: Normal judgment and insight. Alert and oriented x 3. Normal mood and appropriate affect.   Data Reviewed: I have personally reviewed following labs and imaging studies  CBC: Recent Labs  Lab 04/29/23 0940  WBC 5.1  NEUTROABS 1.8  HGB 14.9  HCT 44.4  MCV 89.2  PLT 244   Basic Metabolic Panel: Recent Labs  Lab 04/29/23 0940  NA 138  K 3.5  CL 104  CO2 24  GLUCOSE 71  BUN 16  CREATININE 0.85  CALCIUM 9.4  MG 2.1  PHOS 3.2   GFR: Estimated Creatinine Clearance: 110.3 mL/min (by C-G formula based on SCr of 0.85 mg/dL). Liver Function Tests: Recent Labs  Lab 04/29/23 0940  AST 24  ALT 22  ALKPHOS 68  BILITOT 1.0  PROT 7.5  ALBUMIN 3.8   No results for input(s): "LIPASE", "AMYLASE" in the last 168 hours. No results for input(s): "AMMONIA" in the last 168 hours. Coagulation Profile: No results for input(s): "INR", "PROTIME" in the last 168 hours. Cardiac Enzymes: No results for input(s): "CKTOTAL", "CKMB", "CKMBINDEX", "TROPONINI" in the last 168 hours. BNP (last 3 results) No results for input(s): "PROBNP" in the last 8760 hours. HbA1C: No results for input(s): "HGBA1C" in the last 72 hours. CBG: No results for input(s): "GLUCAP" in the last 168 hours. Lipid Profile: No results for input(s): "CHOL", "HDL", "LDLCALC", "TRIG", "CHOLHDL", "LDLDIRECT" in the last 72 hours. Thyroid Function Tests: No results for input(s): "TSH", "T4TOTAL", "FREET4", "T3FREE", "THYROIDAB" in the last 72 hours. Anemia Panel: No results for input(s): "VITAMINB12", "FOLATE", "FERRITIN", "TIBC", "IRON", "RETICCTPCT" in the last 72 hours. Sepsis Labs: No results for input(s): "PROCALCITON", "LATICACIDVEN" in the last 168 hours.  No results found for this or any previous visit (from the past 240 hour(s)).    Radiology Studies: No results found.  Scheduled Meds:  amLODipine  10 mg Oral Daily   ARIPiprazole  5 mg Oral Daily   vitamin C  1,000 mg Oral Daily   carvedilol  6.25 mg Oral BID WC   chlorhexidine  15 mL Mouth/Throat TID   enoxaparin (LOVENOX) injection  40 mg Subcutaneous Q24H   escitalopram  5 mg Oral Daily   furosemide  20 mg Oral Daily   gabapentin  900 mg Oral TID   hydrALAZINE  25 mg Oral Q8H   isosorbide mononitrate  15 mg Oral Daily   melatonin  3 mg Oral QHS   morphine  30 mg Oral Q12H   multivitamin with minerals  1 tablet Oral Daily   nystatin  5 mL Oral QID   Continuous Infusions:   LOS: 149 days   Marguerita Merles, DO [facility] Available via Epic secure chat 7am-7pm After these hours, please refer to coverage provider listed on [email or web address] 05/05/2023, 4:21 PM

## 2023-05-05 NOTE — Plan of Care (Signed)
  Problem: Pain Managment: Goal: General experience of comfort will improve Outcome: Progressing   Problem: Safety: Goal: Ability to remain free from injury will improve Outcome: Progressing   Problem: Skin Integrity: Goal: Risk for impaired skin integrity will decrease Outcome: Progressing   Problem: Education: Goal: Knowledge of General Education information will improve Description: Including pain rating scale, medication(s)/side effects and non-pharmacologic comfort measures Outcome: Progressing   

## 2023-05-05 NOTE — Progress Notes (Signed)
Mobility Specialist Progress Note   05/05/23 1005  Mobility  Activity Ambulated with assistance in hallway  Level of Assistance Modified independent, requires aide device or extra time  Assistive Device Wheelchair  Distance Ambulated (ft) 550 ft  Range of Motion/Exercises Active  Activity Response Tolerated well   Patient received WC eager to participate. Propelled long distance in hallway independently.  Tolerated without complaint or incident. Will return later to complete exercises. Was left in [facility] with all needs met.  [place] Makendra Vigeant, BS EXP Mobility Specialist Please contact via SecureChat or Rehab office at [phone]

## 2023-05-06 DIAGNOSIS — I96 Gangrene, not elsewhere classified: Secondary | ICD-10-CM | POA: Diagnosis not present

## 2023-05-06 NOTE — Progress Notes (Signed)
This wirter has been informed that  pt refused his lab work for creatinine today.. Per phlebotomy pt would like it  drawn tomorrow. Attending MD made aware of above.

## 2023-05-06 NOTE — Progress Notes (Signed)
Mobility Specialist Progress Note   05/06/23 1120  Mobility  Activity Transferred from bed to chair;Transferred from chair to bed;Repositioned in chair (BLE exercise and AROM)  Level of Assistance Modified independent, requires aide device or extra time  Assistive Device Wheelchair  Range of Motion/Exercises Active  Activity Response Tolerated well   Patient received in [facility] eager to participate. Completed A&P transfers to and from [facility] to  mat table. Performed 6 x 30 seconds BLE stretches/AROM with focus on hip flexors and hamstrings. Tolerated without complaint or incident. Returned to [facility] without incident and was left with all needs met.  [place] Brighid Koch, BS EXP Mobility Specialist Please contact via SecureChat or Rehab office at [phone]

## 2023-05-06 NOTE — Progress Notes (Signed)
PROGRESS NOTE    [name]  ZOX:[id number] DOB: Apr 05, 1966 DOA: 12/07/2022  PCP: [name], NP   Brief Narrative: This 57 yrs old male with PMH significant for heart failure, CAD, right BKA, COPD, hyperlipidemia, hyperlipidemia, polyneuropathy, Schizoaffective / bipolar disorder, was admitted to the hospital with left lower extremity cellulitis and gangrene. 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     During hospitalization, patient was also noted to have maxillary abscess and periapical abscess and underwent oral surgery with I&D  of anterior maxillary abscess and surgical extraction of multiple teeth and alveoloplasty. Currently stable.   Hospital stay prolonged due to difficulty in placement due to homelessness,  status post left BKA and right AKA. 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   -Previous history of right below-knee amputation.  -Patient is wheelchair-bound.  -He is awaiting disposition. -Discussed with physical therapy and patient is to get a prosthesis for his left side pro bono this week and Prosthetist consult has been placed;  -PT assisting patient in donning LLE Shrinker and demonstrated how to don shrinker on RLE    Anterior Maxillary Abscess: -Status post oral surgery with I&D of anterior maxillary abscess, surgical extraction of teeth 4,5,6,11,12,13,20,21,22,27,28,29.  Simple extraction teeth  # 7,8,9,10,23,24,25,26, Alveoloplasty x 4 quadrants on 03/25/23 Dr [name].   -Patient has completed Augmentin x 7 days.     -Currently on regular fluid consistency diet.   -Continue oral hygiene and continue with benzocaine mucosal gel for the mouth and throat 4 times daily as needed for mild pain   Chronic Combined Systolic and Diastolic Heart failure: -Appears Compensated.   -Continue Carvedilol , Amlodipine , Furosemide , and Imdur along with Hydralazine. -2D echocardiogram from 08/2022 with LV ejection fraction of 40% with global hypokinesis.  -Continue diuresis with furosemide 20 mg po daily and continue monitor for signs and symptoms of volume overload.   Essential Hypertension: Blood pressure controlled. Continue Coreg, amlodipine, furosemide, Imdur, hydralazine -Last BP reading was elevated at 138/98   COPD: -Compensated. Continue Xopenex 0.63 mg nebs every 6 as needed  -Continue to monitor respiratory status carefully   Anxiety: -No longer appears to be on Atarax, consider as needed.    Constipation: -Continue Bowel Regimen with MiraLAX , Dulcolax PRN.  On narcotics   Chronic pain -Continue MS Contin 30 mg every 12 hr scheduled and oxycodone 10 mg p.o. every 4 as needed. -Continue  tramadol 50 mg q12 hr as needed,  gabapentin 900 mg p.o. 3 times daily -Continue with bowel regimen with bisacodyl 5 mg p.o. daily as needed , and MiraLAX 17 g p.o. daily as needed along with magnesium citrate 1 bottle once as needed for severe constipation   Schizoaffective Disorder: -Seen by Psychiatry during hospitalization. -Continue Aripiprazole 5 mg p.o. daily and Escitalopram 5 mg p.o. Daily.     Obesity: -Complicates overall prognosis and care -Estimated body mass index is 30.08 kg/m  as calculated from the following:   Height as of this encounter: 5\' 9"  (1.753 m).   Weight as of this encounter: 92.4 kg.  -Weight Loss and Dietary Counseling given   DVT prophylaxis: Lovenox Code  Status: Full code Family Communication: No family at bed side. Disposition Plan:   Status is: Inpatient Remains inpatient appropriate because: Unsafe discharge, TOC aware and looking for options.   Consultants:  Podiatry Podiatry Orthopedic surgery  Procedures:  Antimicrobials:  Anti-infectives (From admission, onward)    Start     Dose/Rate Route Frequency Ordered Stop   03/19/23 0900  amoxicillin-clavulanate (AUGMENTIN) 875-125 MG per tablet 1 tablet        1 tablet Oral Every 12 hours 03/19/23 0809 04/01/23 2118   03/18/23 2000  Ampicillin-Sulbactam (UNASYN) 3 g in sodium chloride 0.9 % 100 mL IVPB  Status:  Discontinued        3 g 200 mL/hr over 30 Minutes Intravenous Every 6 hours 03/18/23 1939 03/19/23 0809   03/18/23 1645  Ampicillin-Sulbactam (UNASYN) 3 g in sodium chloride 0.9 % 100 mL IVPB  Status:  Discontinued        3 g 200 mL/hr over 30 Minutes Intravenous Every 6 hours 03/18/23 1555 03/18/23 1939   12/26/22 2200  doxycycline (VIBRA-TABS) tablet 100 mg        100 mg Oral Every 12 hours 12/26/22 1307 01/08/23 2126   12/24/22 1000  doxycycline (VIBRA-TABS) tablet 100 mg  Status:  Discontinued        100 mg Oral Every 12 hours 12/24/22 0715 12/26/22 1307   12/10/22 1830  ceFAZolin (ANCEF) IVPB 2g/100 mL premix  Status:  Discontinued        2 g 200 mL/hr over 30 Minutes Intravenous Every 8 hours 12/10/22 1731 12/10/22 1740   12/10/22 1330  ceFAZolin (ANCEF) IVPB 2g/100 mL premix        2 g 200 mL/hr over 30 Minutes Intravenous On call to O.R. 12/10/22 0959 12/10/22 1534   12/08/22 0530  vancomycin (VANCOREADY) IVPB 1500 mg/300 mL        1,500 mg 150 mL/hr over 120 Minutes Intravenous Every 12 hours 12/07/22 1741 12/11/22 0955   12/08/22 0345  metroNIDAZOLE (FLAGYL) IVPB 500 mg        500 mg 100 mL/hr over 60 Minutes Intravenous Every 12 hours 12/07/22 1742 12/12/22 0344   12/07/22 2330  ceFEPIme (MAXIPIME) 2 g in sodium chloride 0.9 % 100 mL IVPB        2 g 200  mL/hr over 30 Minutes Intravenous Every 8 hours 12/07/22 1741 12/11/22 1529   12/07/22 2200  metroNIDAZOLE (FLAGYL) IVPB 500 mg  Status:  Discontinued        500 mg 100 mL/hr over 60 Minutes Intravenous Every 12 hours 12/07/22 2048 12/07/22 2056   12/07/22 1530  ceFEPIme (MAXIPIME) 2 g in sodium chloride 0.9 % 100 mL IVPB        2 g 200 mL/hr over 30 Minutes Intravenous  Once 12/07/22 1516 12/07/22 1734   12/07/22 1530  vancomycin (VANCOREADY) IVPB 1500 mg/300 mL        1,500 mg 150 mL/hr over 120 Minutes Intravenous  Once 12/07/22 1516 12/09/22 0333   12/07/22 1515  metroNIDAZOLE (FLAGYL) IVPB 500 mg        500 mg 100 mL/hr over 60 Minutes Intravenous  Once 12/07/22 1506 12/07/22 1734   12/07/22 1500  ceFAZolin (ANCEF) IVPB 1 g/50 mL premix  Status:  Discontinued        1 g 100 mL/hr over 30 Minutes Intravenous  Once 12/07/22 1452 12/07/22 1513       Subjective: Patient was seen and examined at bedside.Overnight events noted.   Patient was sitting on the Wheel chair.  He states he is in process to get prosthesis for his left leg.  Objective: Vitals:   05/04/23 0926 05/05/23 0812 05/05/23 1437 05/06/23 0826  BP: (!) 160/124 (!) 153/93 (!) 138/98 (!) 140/93  Pulse: 71 78 67 66  Resp: 18 17 17 18   Temp:  98.2 F (36.8 C) 98.1 F (36.7 C) 97.8 F (36.6 C)  TempSrc:  Oral Oral Oral  SpO2: 100% 100% 100% 99%  Weight:      Height:       No intake or output data in the 24 hours ending 05/06/23 1231 Filed Weights   04/28/23 0500 04/29/23 0500 05/04/23 0500  Weight: 89.8 kg 91.3 kg 92.4 kg    Examination:  General exam: Appears calm and comfortable, not in any acute distress.  Deconditioned Respiratory system: CTA bilaterally, respiratory effort normal, RR 16. Cardiovascular system: S1 & S2 heard, RRR. No JVD, murmurs, rubs, gallops or clicks. No pedal edema. Gastrointestinal system: Abdomen is soft, non tender, non distended, bowel sounds present Central nervous system:  Alert and oriented x 3 . No focal neurological deficits. Extremities: Status post left AKA, right BKA Skin: No rashes, lesions or ulcers Psychiatry: Judgement and insight appear normal. Mood & affect appropriate.     Data Reviewed: I have personally reviewed following labs and imaging studies  CBC: No results for input(s): "WBC", "NEUTROABS", "HGB", "HCT", "MCV", "PLT" in the last 168 hours. Basic Metabolic Panel: No results for input(s): "NA", "K", "CL", "CO2", "GLUCOSE", "BUN", "CREATININE", "CALCIUM", "MG", "PHOS" in the last 168 hours. GFR: Estimated Creatinine Clearance: 110.3 mL/min (by C-G formula based on SCr of 0.85 mg/dL). Liver Function Tests: No results for input(s): "AST", "ALT", "ALKPHOS", "BILITOT", "PROT", "ALBUMIN" in the last 168 hours. No results for input(s): "LIPASE", "AMYLASE" in the last 168 hours. No results for input(s): "AMMONIA" in the last 168 hours. Coagulation Profile: No results for input(s): "INR", "PROTIME" in the last 168 hours. Cardiac Enzymes: No results for input(s): "CKTOTAL", "CKMB", "CKMBINDEX", "TROPONINI" in the last 168 hours. BNP (last 3 results) No results for input(s): "PROBNP" in the last 8760 hours. HbA1C: No results for input(s): "HGBA1C" in the last 72 hours. CBG: No results for input(s): "GLUCAP" in the last 168 hours. Lipid Profile: No results for input(s): "CHOL", "HDL", "LDLCALC", "TRIG", "CHOLHDL", "LDLDIRECT" in the last 72 hours. Thyroid Function Tests: No results for input(s): "TSH", "T4TOTAL", "FREET4", "T3FREE", "THYROIDAB" in the last 72 hours. Anemia Panel: No results for input(s): "VITAMINB12", "FOLATE", "FERRITIN", "TIBC", "IRON", "RETICCTPCT" in the last 72 hours. Sepsis Labs: No results for input(s): "PROCALCITON", "LATICACIDVEN" in the last 168 hours.  No results found for this or any previous visit (from the past 240 hour(s)).   Radiology Studies: No results found.  Scheduled Meds:  amLODipine  10 mg Oral  Daily   ARIPiprazole  5 mg Oral Daily   vitamin C  1,000 mg Oral Daily   carvedilol  6.25 mg Oral BID WC   chlorhexidine  15 mL Mouth/Throat TID   enoxaparin (LOVENOX) injection  40 mg Subcutaneous Q24H   escitalopram  5 mg Oral Daily   furosemide  20 mg Oral Daily   gabapentin  900 mg Oral TID   hydrALAZINE  25 mg Oral Q8H   isosorbide mononitrate  15 mg Oral Daily   melatonin  3 mg Oral QHS   morphine  30 mg Oral Q12H   multivitamin with minerals  1 tablet Oral Daily   nystatin  5 mL Oral QID   Continuous Infusions:   LOS: 150 days    Time spent: 50 mins    Willeen Niece, MD [facility]   If 7PM-7AM, please contact night-coverage

## 2023-05-07 DIAGNOSIS — I96 Gangrene, not elsewhere classified: Secondary | ICD-10-CM | POA: Diagnosis not present

## 2023-05-07 NOTE — Progress Notes (Signed)
PROGRESS NOTE    [name]  NWG:[id number] DOB: [birth year] DOA: 12/07/2022  PCP: [name], NP   Brief Narrative: This 57 yrs old male with PMH significant for heart failure, CAD, right AKA, COPD, hyperlipidemia, hyperlipidemia, polyneuropathy, Schizoaffective / bipolar disorder, was admitted to the hospital with left lower extremity cellulitis and gangrene. 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     During hospitalization, patient was also noted to have maxillary abscess and periapical abscess and underwent oral surgery with I&D  of anterior maxillary abscess and surgical extraction of multiple teeth and alveoloplasty. Currently stable.   Hospital stay prolonged due to difficulty in placement due to homelessness,  status post left BKA and right AKA. 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   -Previous history of right below-knee amputation.  -Patient is wheelchair-bound.  -He is awaiting disposition. -Discussed with physical therapy and patient is to get a prosthesis for his left side pro bono this week and Prosthetist consult has been placed;  -PT assisting patient in donning LLE Shrinker and demonstrated how to don shrinker on RLE    Anterior Maxillary Abscess: -Status post oral surgery with I&D of anterior maxillary abscess, surgical extraction of teeth 4,5,6,11,12,13,20,21,22,27,28,29.  Simple extraction teeth  # 7,8,9,10,23,24,25,26, Alveoloplasty x 4 quadrants on 03/25/23 Dr [name].   -Patient has completed Augmentin x 7 days.     -Currently on regular fluid consistency diet.   -Continue oral hygiene and continue with benzocaine mucosal gel for the mouth and throat 4 times daily as needed for mild pain.   Chronic Combined Systolic and Diastolic Heart failure: -Appears Compensated.   -Continue Carvedilol , Amlodipine , Furosemide , and Imdur along with Hydralazine. -2D echocardiogram from 08/2022 with LV ejection fraction of 40% with global hypokinesis.  -Continue diuresis with furosemide 20 mg po daily and continue monitor for signs and symptoms of volume overload.   Essential Hypertension: Blood pressure controlled. Continue Coreg, amlodipine, furosemide, Imdur, hydralazine -Last BP reading was elevated at 138/98   COPD: -Compensated. Continue Xopenex 0.63 mg nebs every 6 as needed  -Continue to monitor respiratory status carefully.   Anxiety: -No longer appears to be on Atarax, consider as needed.    Constipation: -Continue Bowel Regimen with MiraLAX , Dulcolax PRN.  On narcotics   Chronic pain -Continue MS Contin 30 mg every 12 hr scheduled and oxycodone 10 mg p.o. every 4 as needed. -Continue  tramadol 50 mg q12 hr as needed,  gabapentin 900 mg p.o. 3 times daily -Continue with bowel regimen with bisacodyl 5 mg p.o. daily as needed , and MiraLAX 17 g p.o. daily as needed along with magnesium citrate 1 bottle once as needed for severe constipation   Schizoaffective Disorder: -Seen by Psychiatry during hospitalization. -Continue Aripiprazole 5 mg p.o. daily and Escitalopram 5 mg p.o. Daily.     Obesity: -Complicates overall prognosis and care -Estimated body mass index is 30.08 kg/m  as calculated from the following:   Height as of this encounter: 5\' 9"  (1.753 m).   Weight as of this encounter: 92.4 kg.  -Weight Loss and Dietary Counseling given   DVT prophylaxis: Lovenox Code  Status: Full code Family Communication: No family at bed side. Disposition Plan:   Status is: Inpatient Remains inpatient appropriate because: Unsafe discharge, TOC aware and looking for options.   Consultants:  Podiatry Podiatry Orthopedic surgery  Procedures:  Antimicrobials:  Anti-infectives (From admission, onward)    Start     Dose/Rate Route Frequency Ordered Stop   03/19/23 0900  amoxicillin-clavulanate (AUGMENTIN) 875-125 MG per tablet 1 tablet        1 tablet Oral Every 12 hours 03/19/23 0809 04/01/23 2118   03/18/23 2000  Ampicillin-Sulbactam (UNASYN) 3 g in sodium chloride 0.9 % 100 mL IVPB  Status:  Discontinued        3 g 200 mL/hr over 30 Minutes Intravenous Every 6 hours 03/18/23 1939 03/19/23 0809   03/18/23 1645  Ampicillin-Sulbactam (UNASYN) 3 g in sodium chloride 0.9 % 100 mL IVPB  Status:  Discontinued        3 g 200 mL/hr over 30 Minutes Intravenous Every 6 hours 03/18/23 1555 03/18/23 1939   12/26/22 2200  doxycycline (VIBRA-TABS) tablet 100 mg        100 mg Oral Every 12 hours 12/26/22 1307 01/08/23 2126   12/24/22 1000  doxycycline (VIBRA-TABS) tablet 100 mg  Status:  Discontinued        100 mg Oral Every 12 hours 12/24/22 0715 12/26/22 1307   12/10/22 1830  ceFAZolin (ANCEF) IVPB 2g/100 mL premix  Status:  Discontinued        2 g 200 mL/hr over 30 Minutes Intravenous Every 8 hours 12/10/22 1731 12/10/22 1740   12/10/22 1330  ceFAZolin (ANCEF) IVPB 2g/100 mL premix        2 g 200 mL/hr over 30 Minutes Intravenous On call to O.R. 12/10/22 0959 12/10/22 1534   12/08/22 0530  vancomycin (VANCOREADY) IVPB 1500 mg/300 mL        1,500 mg 150 mL/hr over 120 Minutes Intravenous Every 12 hours 12/07/22 1741 12/11/22 0955   12/08/22 0345  metroNIDAZOLE (FLAGYL) IVPB 500 mg        500 mg 100 mL/hr over 60 Minutes Intravenous Every 12 hours 12/07/22 1742 12/12/22 0344   12/07/22 2330  ceFEPIme (MAXIPIME) 2 g in sodium chloride 0.9 % 100 mL IVPB        2 g 200  mL/hr over 30 Minutes Intravenous Every 8 hours 12/07/22 1741 12/11/22 1529   12/07/22 2200  metroNIDAZOLE (FLAGYL) IVPB 500 mg  Status:  Discontinued        500 mg 100 mL/hr over 60 Minutes Intravenous Every 12 hours 12/07/22 2048 12/07/22 2056   12/07/22 1530  ceFEPIme (MAXIPIME) 2 g in sodium chloride 0.9 % 100 mL IVPB        2 g 200 mL/hr over 30 Minutes Intravenous  Once 12/07/22 1516 12/07/22 1734   12/07/22 1530  vancomycin (VANCOREADY) IVPB 1500 mg/300 mL        1,500 mg 150 mL/hr over 120 Minutes Intravenous  Once 12/07/22 1516 12/09/22 0333   12/07/22 1515  metroNIDAZOLE (FLAGYL) IVPB 500 mg        500 mg 100 mL/hr over 60 Minutes Intravenous  Once 12/07/22 1506 12/07/22 1734   12/07/22 1500  ceFAZolin (ANCEF) IVPB 1 g/50 mL premix  Status:  Discontinued        1 g 100 mL/hr over 30 Minutes Intravenous  Once 12/07/22 1452 12/07/22 1513       Subjective: Patient was seen and examined at bedside.Overnight events noted.   Patient was sitting on the wheelchair.  He stated he is in the process to get prosthesis for his left leg.   Objective: Vitals:   05/06/23 0826 05/06/23 2251 05/07/23 0500 05/07/23 0901  BP: (!) 140/93 109/62  (!) 140/90  Pulse: 66 98    Resp: 18 16  18   Temp: 97.8 F (36.6 C) 97.8 F (36.6 C)  98.1 F (36.7 C)  TempSrc: Oral Oral    SpO2: 99% 91%  100%  Weight:   90.6 kg   Height:       No intake or output data in the 24 hours ending 05/07/23 1044 Filed Weights   04/29/23 0500 05/04/23 0500 05/07/23 0500  Weight: 91.3 kg 92.4 kg 90.6 kg    Examination:  General exam: Appears calm and comfortable, not in any acute distress.  Deconditioned Respiratory system: CTA bilaterally, respiratory effort normal, RR 13. Cardiovascular system: S1 & S2 heard, RRR. No JVD, murmurs, rubs, gallops or clicks. No pedal edema. Gastrointestinal system: Abdomen is soft, non tender, non distended, bowel sounds present Central nervous system: Alert and oriented x 3  . No focal neurological deficits. Extremities: Status post left BKA, right BKA Skin: No rashes, lesions or ulcers Psychiatry: Judgement and insight appear normal. Mood & affect appropriate.     Data Reviewed: I have personally reviewed following labs and imaging studies  CBC: No results for input(s): "WBC", "NEUTROABS", "HGB", "HCT", "MCV", "PLT" in the last 168 hours. Basic Metabolic Panel: No results for input(s): "NA", "K", "CL", "CO2", "GLUCOSE", "BUN", "CREATININE", "CALCIUM", "MG", "PHOS" in the last 168 hours. GFR: Estimated Creatinine Clearance: 109.3 mL/min (by C-G formula based on SCr of 0.85 mg/dL). Liver Function Tests: No results for input(s): "AST", "ALT", "ALKPHOS", "BILITOT", "PROT", "ALBUMIN" in the last 168 hours. No results for input(s): "LIPASE", "AMYLASE" in the last 168 hours. No results for input(s): "AMMONIA" in the last 168 hours. Coagulation Profile: No results for input(s): "INR", "PROTIME" in the last 168 hours. Cardiac Enzymes: No results for input(s): "CKTOTAL", "CKMB", "CKMBINDEX", "TROPONINI" in the last 168 hours. BNP (last 3 results) No results for input(s): "PROBNP" in the last 8760 hours. HbA1C: No results for input(s): "HGBA1C" in the last 72 hours. CBG: No results for input(s): "GLUCAP" in the last 168 hours. Lipid Profile: No results for input(s): "CHOL", "HDL", "LDLCALC", "TRIG", "CHOLHDL", "LDLDIRECT" in the last 72 hours. Thyroid Function Tests: No results for input(s): "TSH", "T4TOTAL", "FREET4", "T3FREE", "THYROIDAB" in the last 72 hours. Anemia Panel: No results for input(s): "VITAMINB12", "FOLATE", "FERRITIN", "TIBC", "IRON", "RETICCTPCT" in the last 72 hours. Sepsis Labs: No results for input(s): "PROCALCITON", "LATICACIDVEN" in the last 168 hours.  No results found for this or any previous visit (from the past 240 hour(s)).   Radiology Studies: No results found.  Scheduled Meds:  amLODipine  10 mg Oral Daily   ARIPiprazole   5 mg Oral Daily   vitamin C  1,000 mg Oral Daily   carvedilol  6.25 mg Oral BID WC   chlorhexidine  15 mL Mouth/Throat TID   enoxaparin (LOVENOX) injection  40 mg Subcutaneous Q24H   escitalopram  5 mg Oral Daily   furosemide  20 mg Oral Daily   gabapentin  900 mg Oral TID   hydrALAZINE  25 mg Oral Q8H   isosorbide mononitrate  15 mg Oral Daily   melatonin  3 mg Oral QHS   morphine  30 mg Oral Q12H   multivitamin with minerals  1 tablet Oral Daily   nystatin  5 mL Oral QID   Continuous Infusions:   LOS: 151 days    Time spent: 35 mins    Willeen Niece, MD [facility]   If 7PM-7AM, please contact night-coverage

## 2023-05-07 NOTE — Plan of Care (Signed)
  Problem: Education: Goal: Knowledge of the prescribed therapeutic regimen will improve Outcome: Progressing Goal: Ability to verbalize activity precautions or restrictions will improve Outcome: Progressing Goal: Understanding of discharge needs will improve Outcome: Progressing   Problem: Education: Goal: Ability to verbalize activity precautions or restrictions will improve Outcome: Progressing   Problem: Activity: Goal: Ability to perform//tolerate increased activity and mobilize with assistive devices will improve Outcome: Progressing   Problem: Clinical Measurements: Goal: Postoperative complications will be avoided or minimized Outcome: Progressing   Problem: Self-Care: Goal: Ability to meet self-care needs will improve Outcome: Progressing

## 2023-05-08 DIAGNOSIS — I96 Gangrene, not elsewhere classified: Secondary | ICD-10-CM | POA: Diagnosis not present

## 2023-05-08 NOTE — Plan of Care (Signed)
  Problem: Pain Managment: Goal: General experience of comfort will improve Outcome: Progressing   Problem: Self-Care: Goal: Ability to meet self-care needs will improve Outcome: Progressing   

## 2023-05-08 NOTE — TOC Progression Note (Addendum)
Transition of Care [facility]) - Progression Note    Patient Details  Name: Stephane Niemann MRN: [id number] Date of Birth: Jan 10, 1966  [facility] of [facility]) CM/SW Contact  [name], [name] Phone Number: 05/08/2023, 11:23 AM  Clinical Narrative:    There are no discharge planning updates available at this time.   CSW remains in contact with [facility] staff that is working on obtaining patient permanent housing.  CSW spoke with patient to update him on information.  Expected Discharge Plan: Home/Self Care Barriers to Discharge: Homeless with medical needs  Expected Discharge Plan and Services   Discharge Planning Services: CM Consult Post Acute Care Choice: Home Health Living arrangements for the past 2 months: Homeless                                       Social Determinants of Health (SDOH) Interventions SDOH Screenings   Food Insecurity: Food Insecurity Present (11/24/2022)  Housing: Medium Risk (12/07/2022)  Transportation Needs: Unmet Transportation Needs (12/07/2022)  Utilities: Not At Risk (12/07/2022)  Alcohol Screen: Medium Risk (06/24/2019)  Depression (PHQ2-9): Medium Risk (02/10/2021)  Tobacco Use: High Risk (03/26/2023)    Readmission Risk Interventions    12/09/2022    9:02 AM 12/01/2022    3:56 PM 08/18/2022    9:00 AM  Readmission Risk Prevention Plan  Transportation Screening Complete Complete Complete  Medication Review Oceanographer) Complete Complete Complete  PCP or Specialist appointment within 3-5 days of discharge Complete Complete Complete  HRI or Home Care Consult Complete Complete Complete  SW Recovery Care/Counseling Consult Complete Complete Complete  Palliative Care Screening Not Applicable Not Applicable Not Applicable  Skilled Nursing Facility Not Applicable Not Applicable Not Applicable  SNF Comments  n/a

## 2023-05-08 NOTE — Plan of Care (Signed)
  Problem: Pain Managment: Goal: General experience of comfort will improve Outcome: Progressing   Problem: Skin Integrity: Goal: Risk for impaired skin integrity will decrease Outcome: Progressing   Problem: Education: Goal: Knowledge of General Education information will improve Description: Including pain rating scale, medication(s)/side effects and non-pharmacologic comfort measures Outcome: Progressing   Problem: Health Behavior/Discharge Planning: Goal: Ability to manage health-related needs will improve Outcome: Progressing   Problem: Safety: Goal: Ability to remain free from injury will improve Outcome: Progressing   Problem: Education: Goal: Knowledge of the prescribed therapeutic regimen will improve Outcome: Progressing

## 2023-05-08 NOTE — Progress Notes (Signed)
PROGRESS NOTE    [name]  QIH:[id number] DOB: December 14, 1965 DOA: 12/07/2022  PCP: [name], NP   Brief Narrative: This 57 yrs old male with PMH significant for heart failure, CAD, right AKA, COPD, hyperlipidemia, hyperlipidemia, polyneuropathy, Schizoaffective / bipolar disorder, was admitted to the hospital with left lower extremity cellulitis and gangrene. 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     During hospitalization, patient was also noted to have maxillary abscess and periapical abscess and underwent oral surgery with I&D  of anterior maxillary abscess and surgical extraction of multiple teeth and alveoloplasty. Currently stable.   Hospital stay prolonged due to difficulty in placement due to homelessness,  status post left BKA and right AKA. [facility], have offered to provide a prosthesis for LLE (the BKA).  CSW is in contact with [facility] staff who is working on obtaining patient permanent housing.   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   -Previous history of right below-knee amputation.  -Patient is wheelchair-bound.  -He is awaiting disposition. -Discussed with physical therapy and patient is to get a prosthesis for his left side pro bono this week and Prosthetist consult has been placed;  -PT assisting patient in donning LLE Shrinker and demonstrated how to don shrinker on RLE    Anterior Maxillary Abscess: -Status post oral surgery with I&D of anterior maxillary  abscess, surgical extraction of teeth 4,5,6,11,12,13,20,21,22,27,28,29.  -Simple extraction teeth # 7,8,9,10,23,24,25,26, Alveoloplasty x 4 quadrants on 03/25/23 Dr [name].   -Patient has completed Augmentin x 7 days.     -Currently on regular fluid consistency diet.   -Continue oral hygiene and continue with benzocaine mucosal gel for the mouth and throat 4 times daily as needed for mild pain.   Chronic Combined Systolic and Diastolic Heart failure: -Appears Compensated.   -Continue Carvedilol , Amlodipine , Furosemide , and Imdur along with Hydralazine. -2D echocardiogram from 08/2022 with LV ejection fraction of 40% with global hypokinesis.  -Continue diuresis with furosemide 20 mg po daily and continue monitor for signs and symptoms of volume overload.   Essential Hypertension: Blood pressure controlled. Continue Coreg, amlodipine, furosemide, Imdur, hydralazine -Last BP reading was elevated at 138/98   COPD: -Compensated. Continue Xopenex 0.63 mg nebs every 6 as needed  -Continue to monitor respiratory status carefully.   Anxiety: -No longer appears to be on Atarax, consider as needed.    Constipation: -Continue Bowel Regimen with MiraLAX , Dulcolax PRN.  On narcotics   Chronic pain -Continue MS Contin 30 mg every 12 hr scheduled and oxycodone 10 mg p.o. every 4 as needed. -Continue  tramadol 50 mg q12 hr as needed,  gabapentin 900 mg p.o. 3 times daily -Continue with bowel regimen with bisacodyl 5 mg p.o. daily as needed , and MiraLAX 17 g p.o. daily as needed along with magnesium citrate 1 bottle once as needed for severe constipation   Schizoaffective Disorder: -Seen by Psychiatry during hospitalization. -Continue Aripiprazole 5 mg p.o. daily and Escitalopram 5 mg p.o. Daily.  Obesity: -Complicates overall prognosis and care -Estimated body mass index is 30.08 kg/m as calculated from the following:   Height as of this encounter: 5\' 9"  (1.753 m).   Weight as of this  encounter: 92.4 kg.  -Weight Loss and Dietary Counseling given   DVT prophylaxis: Lovenox Code Status: Full code Family Communication: No family at bed side. Disposition Plan:   Status is: Inpatient Remains inpatient appropriate because: Unsafe discharge, TOC aware and looking for options.   Consultants:  Podiatry Podiatry Orthopedic surgery  Procedures:  Antimicrobials:  Anti-infectives (From admission, onward)    Start     Dose/Rate Route Frequency Ordered Stop   03/19/23 0900  amoxicillin-clavulanate (AUGMENTIN) 875-125 MG per tablet 1 tablet        1 tablet Oral Every 12 hours 03/19/23 0809 04/01/23 2118   03/18/23 2000  Ampicillin-Sulbactam (UNASYN) 3 g in sodium chloride 0.9 % 100 mL IVPB  Status:  Discontinued        3 g 200 mL/hr over 30 Minutes Intravenous Every 6 hours 03/18/23 1939 03/19/23 0809   03/18/23 1645  Ampicillin-Sulbactam (UNASYN) 3 g in sodium chloride 0.9 % 100 mL IVPB  Status:  Discontinued        3 g 200 mL/hr over 30 Minutes Intravenous Every 6 hours 03/18/23 1555 03/18/23 1939   12/26/22 2200  doxycycline (VIBRA-TABS) tablet 100 mg        100 mg Oral Every 12 hours 12/26/22 1307 01/08/23 2126   12/24/22 1000  doxycycline (VIBRA-TABS) tablet 100 mg  Status:  Discontinued        100 mg Oral Every 12 hours 12/24/22 0715 12/26/22 1307   12/10/22 1830  ceFAZolin (ANCEF) IVPB 2g/100 mL premix  Status:  Discontinued        2 g 200 mL/hr over 30 Minutes Intravenous Every 8 hours 12/10/22 1731 12/10/22 1740   12/10/22 1330  ceFAZolin (ANCEF) IVPB 2g/100 mL premix        2 g 200 mL/hr over 30 Minutes Intravenous On call to O.R. 12/10/22 0959 12/10/22 1534   12/08/22 0530  vancomycin (VANCOREADY) IVPB 1500 mg/300 mL        1,500 mg 150 mL/hr over 120 Minutes Intravenous Every 12 hours 12/07/22 1741 12/11/22 0955   12/08/22 0345  metroNIDAZOLE (FLAGYL) IVPB 500 mg        500 mg 100 mL/hr over 60 Minutes Intravenous Every 12 hours 12/07/22 1742 12/12/22  0344   12/07/22 2330  ceFEPIme (MAXIPIME) 2 g in sodium chloride 0.9 % 100 mL IVPB        2 g 200 mL/hr over 30 Minutes Intravenous Every 8 hours 12/07/22 1741 12/11/22 1529   12/07/22 2200  metroNIDAZOLE (FLAGYL) IVPB 500 mg  Status:  Discontinued        500 mg 100 mL/hr over 60 Minutes Intravenous Every 12 hours 12/07/22 2048 12/07/22 2056   12/07/22 1530  ceFEPIme (MAXIPIME) 2 g in sodium chloride 0.9 % 100 mL IVPB        2 g 200 mL/hr over 30 Minutes Intravenous  Once 12/07/22 1516 12/07/22 1734   12/07/22 1530  vancomycin (VANCOREADY) IVPB 1500 mg/300 mL        1,500 mg 150 mL/hr over 120 Minutes Intravenous  Once 12/07/22 1516 12/09/22 0333   12/07/22 1515  metroNIDAZOLE (FLAGYL) IVPB 500 mg        500 mg 100 mL/hr over 60 Minutes Intravenous  Once 12/07/22 1506 12/07/22 1734  12/07/22 1500  ceFAZolin (ANCEF) IVPB 1 g/50 mL premix  Status:  Discontinued        1 g 100 mL/hr over 30 Minutes Intravenous  Once 12/07/22 1452 12/07/22 1513       Subjective: Patient was seen and examined at bedside.Overnight events noted.   Patient was sitting comfortably in wheelchair.  He stated he is in process to get prosthesis for his left leg. CSW is  in contact with [facility] staff who is working on obtaining patient permanent housing.    Objective: Vitals:   05/07/23 2109 05/08/23 0500 05/08/23 0640 05/08/23 0833  BP: 124/75  (!) 161/98 (!) 150/83  Pulse: 71  61 64  Resp: 16  18 16   Temp: 97.9 F (36.6 C)  98 F (36.7 C) (!) 97.5 F (36.4 C)  TempSrc: Oral  Oral Oral  SpO2: 96%  98% 93%  Weight:  90.5 kg    Height:       No intake or output data in the 24 hours ending 05/08/23 1203 Filed Weights   05/04/23 0500 05/07/23 0500 05/08/23 0500  Weight: 92.4 kg 90.6 kg 90.5 kg    Examination:  General exam: Appears calm, comfortable, not in any acute distress.  Deconditioned Respiratory system: CTA bilaterally, respiratory effort normal, RR 15. Cardiovascular system: S1 & S2  heard, RRR. No JVD, murmurs, rubs, gallops or clicks. No pedal edema. Gastrointestinal system: Abdomen is soft, non tender, non distended, bowel sounds present Central nervous system: Alert and oriented x 3 . No focal neurological deficits. Extremities: Status post left BKA, right BKA Skin: No rashes, lesions or ulcers Psychiatry: Judgement and insight appear normal. Mood & affect appropriate.     Data Reviewed: I have personally reviewed following labs and imaging studies  CBC: No results for input(s): "WBC", "NEUTROABS", "HGB", "HCT", "MCV", "PLT" in the last 168 hours. Basic Metabolic Panel: No results for input(s): "NA", "K", "CL", "CO2", "GLUCOSE", "BUN", "CREATININE", "CALCIUM", "MG", "PHOS" in the last 168 hours. GFR: Estimated Creatinine Clearance: 109.2 mL/min (by C-G formula based on SCr of 0.85 mg/dL). Liver Function Tests: No results for input(s): "AST", "ALT", "ALKPHOS", "BILITOT", "PROT", "ALBUMIN" in the last 168 hours. No results for input(s): "LIPASE", "AMYLASE" in the last 168 hours. No results for input(s): "AMMONIA" in the last 168 hours. Coagulation Profile: No results for input(s): "INR", "PROTIME" in the last 168 hours. Cardiac Enzymes: No results for input(s): "CKTOTAL", "CKMB", "CKMBINDEX", "TROPONINI" in the last 168 hours. BNP (last 3 results) No results for input(s): "PROBNP" in the last 8760 hours. HbA1C: No results for input(s): "HGBA1C" in the last 72 hours. CBG: No results for input(s): "GLUCAP" in the last 168 hours. Lipid Profile: No results for input(s): "CHOL", "HDL", "LDLCALC", "TRIG", "CHOLHDL", "LDLDIRECT" in the last 72 hours. Thyroid Function Tests: No results for input(s): "TSH", "T4TOTAL", "FREET4", "T3FREE", "THYROIDAB" in the last 72 hours. Anemia Panel: No results for input(s): "VITAMINB12", "FOLATE", "FERRITIN", "TIBC", "IRON", "RETICCTPCT" in the last 72 hours. Sepsis Labs: No results for input(s): "PROCALCITON", "LATICACIDVEN" in  the last 168 hours.  No results found for this or any previous visit (from the past 240 hour(s)).   Radiology Studies: No results found.  Scheduled Meds:  amLODipine  10 mg Oral Daily   ARIPiprazole  5 mg Oral Daily   vitamin C  1,000 mg Oral Daily   carvedilol  6.25 mg Oral BID WC   chlorhexidine  15 mL Mouth/Throat TID   enoxaparin (LOVENOX) injection  40 mg  Subcutaneous Q24H   escitalopram  5 mg Oral Daily   furosemide  20 mg Oral Daily   gabapentin  900 mg Oral TID   hydrALAZINE  25 mg Oral Q8H   isosorbide mononitrate  15 mg Oral Daily   melatonin  3 mg Oral QHS   morphine  30 mg Oral Q12H   multivitamin with minerals  1 tablet Oral Daily   nystatin  5 mL Oral QID   Continuous Infusions:   LOS: 152 days    Time spent: 35 mins    Willeen Niece, MD [facility]   If 7PM-7AM, please contact night-coverage

## 2023-05-08 NOTE — Progress Notes (Signed)
Mobility Specialist Progress Note   05/08/23 1530  Mobility  Activity Repositioned in chair (LE AROM & BUE exercise)  Level of Assistance Standby assist, set-up cues, supervision of patient - no hands on  Assistive Device None  Range of Motion/Exercises Active;All extremities  Activity Response Tolerated well   Patient received in [facility] and agreeable to participate. Completed LLE AROM and BUE exercises with supervision. Tolerated without complaint or incident. Was left in [facility] with all needs met, call bell in reach.   [place] Ann Bohne, BS EXP Mobility Specialist Please contact via SecureChat or Rehab office at [phone]

## 2023-05-09 DIAGNOSIS — I96 Gangrene, not elsewhere classified: Secondary | ICD-10-CM | POA: Diagnosis not present

## 2023-05-09 NOTE — Progress Notes (Signed)
PROGRESS NOTE    [name]  WUJ:[id number] DOB: 09-02-1966 DOA: 12/07/2022  PCP: [name] Sharps, NP   Brief Narrative: This 57 yrs old male with PMH significant for heart failure, CAD, right AKA, COPD, hyperlipidemia, hyperlipidemia, polyneuropathy, Schizoaffective / bipolar disorder, was admitted to the hospital with left lower extremity cellulitis and gangrene. 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     During hospitalization, patient was also noted to have maxillary abscess and periapical abscess and underwent oral surgery with I&D  of anterior maxillary abscess and surgical extraction of multiple teeth and alveoloplasty. Currently stable.   Hospital stay prolonged due to difficulty in placement due to homelessness,  status post left BKA and right AKA. [facility], have offered to provide a prosthesis for LLE (the BKA).  CSW is in contact with [name] staff who is working on obtaining patient permanent housing.   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   -Previous history of right below-knee amputation.  -Patient is wheelchair-bound.  -He is awaiting disposition. -Discussed with physical therapy and patient is to get a prosthesis for his left side pro bono this week and Prosthetist consult has been placed;  -PT assisting patient in donning LLE Shrinker and demonstrated how to don shrinker on RLE    Anterior Maxillary Abscess: -Status post oral surgery with I&D of anterior maxillary  abscess, surgical extraction of teeth 4,5,6,11,12,13,20,21,22,27,28,29.  -Simple extraction teeth # 7,8,9,10,23,24,25,26, Alveoloplasty x 4 quadrants on 03/25/23 Dr [name].   -Patient has completed Augmentin x 7 days.     -Currently on regular fluid consistency diet.   -Continue oral hygiene and continue with benzocaine mucosal gel for the mouth and throat 4 times daily as needed for mild pain.   Chronic Combined Systolic and Diastolic Heart failure: -Appears Compensated.   -Continue Carvedilol , Amlodipine , Furosemide , and Imdur along with Hydralazine. -2D echocardiogram from 08/2022 with LV ejection fraction of 40% with global hypokinesis.  -Continue diuresis with furosemide 20 mg po daily and continue monitor for signs and symptoms of volume overload.   Essential Hypertension: Blood pressure controlled. Continue Coreg, amlodipine, furosemide, Imdur, hydralazine   COPD: -Compensated. Continue Xopenex 0.63 mg nebs every 6 as needed  -Continue to monitor respiratory status carefully.   Anxiety: -No longer appears to be on Atarax, consider as needed.  Constipation: -Continue Bowel Regimen with MiraLAX , Dulcolax PRN.  On narcotics   Chronic pain -Continue MS Contin 30 mg every 12 hr scheduled and oxycodone 10 mg p.o. every 4 as needed. -Continue  tramadol 50 mg q12 hr as needed,  gabapentin 900 mg p.o. 3 times daily -Continue with bowel regimen with bisacodyl 5 mg p.o. daily as needed , and MiraLAX 17 g p.o. daily as needed along with magnesium citrate 1 bottle once as needed for severe constipation   Schizoaffective Disorder: -Seen by Psychiatry during hospitalization. -Continue Aripiprazole 5 mg p.o. daily and Escitalopram 5 mg p.o. Daily.     Obesity: -Complicates overall prognosis and care -  Estimated body mass index is 30.08 kg/m as calculated from the following:   Height as of this encounter: 5\' 9"  (1.753 m).   Weight as of this encounter: 92.4 kg.  -Weight Loss and Dietary  Counseling given   DVT prophylaxis: Lovenox Code Status: Full code Family Communication: No family at bed side. Disposition Plan:   Status is: Inpatient Remains inpatient appropriate because: Unsafe discharge, TOC aware and looking for options.   Consultants:  Podiatry Podiatry Orthopedic surgery  Procedures:  Antimicrobials:  Anti-infectives (From admission, onward)    Start     Dose/Rate Route Frequency Ordered Stop   03/19/23 0900  amoxicillin-clavulanate (AUGMENTIN) 875-125 MG per tablet 1 tablet        1 tablet Oral Every 12 hours 03/19/23 0809 04/01/23 2118   03/18/23 2000  Ampicillin-Sulbactam (UNASYN) 3 g in sodium chloride 0.9 % 100 mL IVPB  Status:  Discontinued        3 g 200 mL/hr over 30 Minutes Intravenous Every 6 hours 03/18/23 1939 03/19/23 0809   03/18/23 1645  Ampicillin-Sulbactam (UNASYN) 3 g in sodium chloride 0.9 % 100 mL IVPB  Status:  Discontinued        3 g 200 mL/hr over 30 Minutes Intravenous Every 6 hours 03/18/23 1555 03/18/23 1939   12/26/22 2200  doxycycline (VIBRA-TABS) tablet 100 mg        100 mg Oral Every 12 hours 12/26/22 1307 01/08/23 2126   12/24/22 1000  doxycycline (VIBRA-TABS) tablet 100 mg  Status:  Discontinued        100 mg Oral Every 12 hours 12/24/22 0715 12/26/22 1307   12/10/22 1830  ceFAZolin (ANCEF) IVPB 2g/100 mL premix  Status:  Discontinued        2 g 200 mL/hr over 30 Minutes Intravenous Every 8 hours 12/10/22 1731 12/10/22 1740   12/10/22 1330  ceFAZolin (ANCEF) IVPB 2g/100 mL premix        2 g 200 mL/hr over 30 Minutes Intravenous On call to O.R. 12/10/22 0959 12/10/22 1534   12/08/22 0530  vancomycin (VANCOREADY) IVPB 1500 mg/300 mL        1,500 mg 150 mL/hr over 120 Minutes Intravenous Every 12 hours 12/07/22 1741 12/11/22 0955   12/08/22 0345  metroNIDAZOLE (FLAGYL) IVPB 500 mg        500 mg 100 mL/hr over 60 Minutes Intravenous Every 12 hours 12/07/22 1742 12/12/22 0344   12/07/22 2330  ceFEPIme (MAXIPIME) 2 g in  sodium chloride 0.9 % 100 mL IVPB        2 g 200 mL/hr over 30 Minutes Intravenous Every 8 hours 12/07/22 1741 12/11/22 1529   12/07/22 2200  metroNIDAZOLE (FLAGYL) IVPB 500 mg  Status:  Discontinued        500 mg 100 mL/hr over 60 Minutes Intravenous Every 12 hours 12/07/22 2048 12/07/22 2056   12/07/22 1530  ceFEPIme (MAXIPIME) 2 g in sodium chloride 0.9 % 100 mL IVPB        2 g 200 mL/hr over 30 Minutes Intravenous  Once 12/07/22 1516 12/07/22 1734   12/07/22 1530  vancomycin (VANCOREADY) IVPB 1500 mg/300 mL        1,500 mg 150 mL/hr over 120 Minutes Intravenous  Once 12/07/22 1516 12/09/22 0333   12/07/22 1515  metroNIDAZOLE (FLAGYL) IVPB 500 mg        500 mg 100 mL/hr over 60 Minutes Intravenous  Once 12/07/22 1506 12/07/22 1734   12/07/22 1500  ceFAZolin (ANCEF) IVPB  1 g/50 mL premix  Status:  Discontinued        1 g 100 mL/hr over 30 Minutes Intravenous  Once 12/07/22 1452 12/07/22 1513       Subjective: Patient was seen and examined at bedside.Overnight events noted.   Patient was seen in the hallway on his wheelchair, he stated he is in process to get prosthesis for his left leg. CSW is  in contact with [facility] staff who is working on obtaining patient permanent housing.    Objective: Vitals:   05/08/23 1440 05/08/23 2005 05/09/23 0500 05/09/23 0900  BP: (!) 164/95 128/77  139/85  Pulse: 81 79  71  Resp: 17 16    Temp: 97.8 F (36.6 C) 97.9 F (36.6 C)  98.2 F (36.8 C)  TempSrc: Oral Oral  Oral  SpO2: 100% 96%  97%  Weight:   90.7 kg   Height:        Intake/Output Summary (Last 24 hours) at 05/09/2023 1142 Last data filed at 05/09/2023 0900 Gross per 24 hour  Intake 720 ml  Output 600 ml  Net 120 ml   Filed Weights   05/07/23 0500 05/08/23 0500 05/09/23 0500  Weight: 90.6 kg 90.5 kg 90.7 kg    Examination:  General exam: Appears comfortable, not in any acute distress, deconditioned. Respiratory system: CTA bilaterally, respiratory effort normal, RR  15. Cardiovascular system: S1 & S2 heard, RRR. No JVD, murmurs, rubs, gallops or clicks. Gastrointestinal system: Abdomen is soft, non tender, non distended, bowel sounds present Central nervous system: Alert and oriented x 3 . No focal neurological deficits. Extremities: Status post left BKA, right BKA Skin: No rashes, lesions or ulcers Psychiatry: Judgement and insight appear normal. Mood & affect appropriate.     Data Reviewed: I have personally reviewed following labs and imaging studies  CBC: No results for input(s): "WBC", "NEUTROABS", "HGB", "HCT", "MCV", "PLT" in the last 168 hours. Basic Metabolic Panel: No results for input(s): "NA", "K", "CL", "CO2", "GLUCOSE", "BUN", "CREATININE", "CALCIUM", "MG", "PHOS" in the last 168 hours. GFR: Estimated Creatinine Clearance: 109.3 mL/min (by C-G formula based on SCr of 0.85 mg/dL). Liver Function Tests: No results for input(s): "AST", "ALT", "ALKPHOS", "BILITOT", "PROT", "ALBUMIN" in the last 168 hours. No results for input(s): "LIPASE", "AMYLASE" in the last 168 hours. No results for input(s): "AMMONIA" in the last 168 hours. Coagulation Profile: No results for input(s): "INR", "PROTIME" in the last 168 hours. Cardiac Enzymes: No results for input(s): "CKTOTAL", "CKMB", "CKMBINDEX", "TROPONINI" in the last 168 hours. BNP (last 3 results) No results for input(s): "PROBNP" in the last 8760 hours. HbA1C: No results for input(s): "HGBA1C" in the last 72 hours. CBG: No results for input(s): "GLUCAP" in the last 168 hours. Lipid Profile: No results for input(s): "CHOL", "HDL", "LDLCALC", "TRIG", "CHOLHDL", "LDLDIRECT" in the last 72 hours. Thyroid Function Tests: No results for input(s): "TSH", "T4TOTAL", "FREET4", "T3FREE", "THYROIDAB" in the last 72 hours. Anemia Panel: No results for input(s): "VITAMINB12", "FOLATE", "FERRITIN", "TIBC", "IRON", "RETICCTPCT" in the last 72 hours. Sepsis Labs: No results for input(s):  "PROCALCITON", "LATICACIDVEN" in the last 168 hours.  No results found for this or any previous visit (from the past 240 hour(s)).   Radiology Studies: No results found.  Scheduled Meds:  amLODipine  10 mg Oral Daily   ARIPiprazole  5 mg Oral Daily   vitamin C  1,000 mg Oral Daily   carvedilol  6.25 mg Oral BID WC   chlorhexidine  15 mL  Mouth/Throat TID   enoxaparin (LOVENOX) injection  40 mg Subcutaneous Q24H   escitalopram  5 mg Oral Daily   furosemide  20 mg Oral Daily   gabapentin  900 mg Oral TID   hydrALAZINE  25 mg Oral Q8H   isosorbide mononitrate  15 mg Oral Daily   melatonin  3 mg Oral QHS   morphine  30 mg Oral Q12H   multivitamin with minerals  1 tablet Oral Daily   nystatin  5 mL Oral QID   Continuous Infusions:   LOS: 153 days    Time spent: 35 mins    Willeen Niece, MD [facility]   If 7PM-7AM, please contact night-coverage

## 2023-05-10 DIAGNOSIS — I96 Gangrene, not elsewhere classified: Secondary | ICD-10-CM | POA: Diagnosis not present

## 2023-05-10 NOTE — Progress Notes (Signed)
  Progress Note   Patient: [name] NFA:[id number] DOB: 06-Jul-1966 DOA: 12/07/2022     154 DOS: the patient was seen and examined on 05/10/2023 at 9:16AM      Brief hospital course: [name] is a 57 y.o. M with sCHF EF 40%, CAD, HLD, HTN, history of right BKA and schizoaffective disorder who presnted with left foot gangrene and underwent left BKA on 1/10.  See prior summary by Dr. Marland [name] from 6/4       Assessment and Plan: Sepsis secondary to cellulitis/gangrene of left foot s/p Left BKA Resolved   Anterior Maxillary Abscess Resolved   Chronic Combined Systolic and Diastolic Heart failure Well compensated, EF 40% - Continue Coreg, furosemide, hydralazine   Essential Hypertension Blood pressure controlled - Continue amlodipine, Coreg, furosemide, hydralazine, Imdur   COPD No active flare - Xopenex as needed   Coronary artery disease with chronic angina -Continue Imdur   Chronic pain -Continue MS Contin 30 twice daily - Continue as needed oxycodone, gabapentin, Lexapro - Continue bowel regimen   Schizoaffective Disorder Seen by Psychiatry during hospitalization. -Continue Abilify, Lexapro  Obesity BMI 30           Subjective: Patient has no complaints, no new concerns     Physical Exam: BP (!) 174/98 (BP Location: Left Arm)   Pulse 64   Temp 98 F (36.7 C) (Oral)   Resp 16   Ht 5\' 9"  (1.753 m)   Wt 90.7 kg   SpO2 100%   BMI 29.53 kg/m   Adult male, sitting up in wheelchair, eating breakfast RRR, no murmurs, no peripheral edema Respiratory normal, lungs clear without rales or wheezes Bilateral BKA, stump skin changes, look clean dry and intact    Data Reviewed: CBC and CMP from a week and a half ago all normal      Disposition: Status is: Inpatient The patient was admitted with gangrene, has had to have amputation of his left neck and is now Bilateral amputations.  Because of his schizophrenia and bilateral amputations,  the patient is unable to return to his prior living arrangement safely and social work continuing to search for disposition  He is medically stable to discharge when disposition is available        Author: Alberteen Sam, MD 05/10/2023 1:14 PM  For on call review [email or web address].

## 2023-05-11 DIAGNOSIS — I96 Gangrene, not elsewhere classified: Secondary | ICD-10-CM | POA: Diagnosis not present

## 2023-05-11 NOTE — Progress Notes (Signed)
Physical Therapy Note  Planning to meet up with Prosthetist tomorrow, 6/11, at noon for initial socket fitting for L LE prosthesis;   Van Clines, PT  Acute Rehabilitation Services Office [phone] Secure Chat welcomed

## 2023-05-11 NOTE — Progress Notes (Signed)
Physical Therapy Treatment Patient Details Name: [name] MRN: [id number] DOB: 1965-12-26 Today's Date: 05/11/2023   History of Present Illness Pt is a 57 y/o M who presented 12/17/22 with L ankle cellulitis and underwent L BKA on 1/10. Oral abscesses noted, underwent oral surgery Wed, 4/24; Initiated fabrication of prosthesis for LLE on 6/3; PMH includes asthma, bioplar 1, depression, schizoaffective disorder, CVA, chronic CHF, homelessness, HTN, DM2, cocaine and cannabis use, and R knee disarticulation    PT Comments    Continuing work on functional mobility and activity tolerance;  He is making steady progress towards his PT goals; Session focused on car transfers and continued discussion re: prep for prostheses; Started session with donning shrinker LLE, Daryon used the instructions on the packaging and did not need physical assist to don the shrinker correctly; He has been voicing that the shrinker is not comfortable recently -- reinforced the need for the shrinker to shape his LLE and get ready for L prosthesis;   Earlier in the day, Savon asked about work on car transfers, so we opted to roll to to practice with their care mock up; Calvary showed solid problem-solving with his wc approach and hand placement; used the overhead bar to lift into the car; Appropriately identified the need for the sliding board for car to wc transfer which looked more like a traditional lateral scoot transfer;   During last Monday's session, Guenther had some questions about his RLE, and as PT and Prosthetist looked further and palpated his R residual limb, noted tenderness at his distal end of the R limb that one wouldn't expect; with the presence of his patella, we inferred that his R LE actually had a knee disarticulation instead of an AKA; Given the distal end of the femur is quite weight bearing, it was surprising that Lucky does not tolerate pressure through the end of his RLE; He had quite a pain-startle  response to palpation there; Pain response seemed like a neuroma possibly, or, we wonder if there is any proximal tibia present in his knee (one can visibly see a component of motion RLE with knee flex/ext); if there is any tibia present in his knee, it will make fitting a r prosthesis much more complicated;    Worth considering getting OR report from his RLE amputation done in [place], or consider getting an xray of his R residual limb     Recommendations for follow up therapy are one component of a multi-disciplinary discharge planning process, led by the attending physician.  Recommendations may be updated based on patient status, additional functional criteria and insurance authorization.  Follow Up Recommendations  Can patient physically be transported by private vehicle: Yes (Currently recommend a sliding board for car transfers; may not need once he gets L prosthesis)    Assistance Recommended at Discharge PRN  Patient can return home with the following Assist for transportation   Equipment Recommendations  Other (comment) (tub bench, sliding board)    Recommendations for Other Services  (Recommend starting the Mental/Behavioral Health support through [name] to start while Barndon is here in the hospital; [name] consult)     Precautions / Restrictions Precautions Precautions: None Precaution Comments: R knee disrticulation, L BKA; Fall risk is present, but minimal Restrictions RLE Weight Bearing: Weight bearing as tolerated LLE Weight Bearing: Weight bearing as tolerated     Mobility  Bed Mobility  Transfers   Equipment used: Sliding board               General transfer comment: Performed car transfer to and from car mock up (on rehab unit); car at low height; extra time to coordinate positioning of wheelchair; heavy use of overhead bar (which approximates the roof of the car) for lifting to the car and steadying as needed; Used  sliding board for transfer car to wc; incr time    Ambulation/Gait                   Teacher, adult education:  (from 5N to 4 MW)  Modified Rankin (Stroke Patients Only)       Balance                                            Cognition Arousal/Alertness: Awake/alert Behavior During Therapy: WFL for tasks assessed/performed Overall Cognitive Status: Within Functional Limits for tasks assessed (for simple mobility tasks)                                 General Comments: When Bejou understands rationale for an exercise or activity, and that rationale is in line with his goals, he participates much better        Exercises Other Exercises Other Exercises: Pull ups using overhead bar of car mock up; 4 reps, lifting approx 3/4 of range against gravity    General Comments        Pertinent Vitals/Pain Pain Assessment Pain Assessment: Faces Faces Pain Scale: No hurt    Home Living                          Prior Function            PT Goals (current goals can now be found in the care plan section) Acute Rehab PT Goals Patient Stated Goal: Prepare for prosthetics fitting; Would like to get housing in [facility] through the Transition to [employer] PT Goal Formulation: With patient Time For Goal Achievement: 05/19/23 Potential to Achieve Goals: Good Progress towards PT goals: Progressing toward goals    Frequency    Min 1X/week      PT Plan Current plan remains appropriate;Frequency needs to be updated (When prosthesis training begins, will incr frequency)    Co-evaluation              AM-PAC PT "6 Clicks" Mobility   Outcome Measure  Help needed turning from your back to your side while in a flat bed without using bedrails?: None Help needed moving from lying on your back to sitting on the side of a flat bed without using bedrails?:  None Help needed moving to and from a bed to a chair (including a wheelchair)?: None Help needed standing up from a chair using your arms (e.g., wheelchair or bedside chair)?: Total Help needed to walk in hospital room?: Total Help needed climbing 3-5 steps with a railing? : Total 6 Click Score: 15    End of Session Equipment Utilized During Treatment: Other (comment) (shrinkers provided by [facility]; sliding board for car transfer) Activity Tolerance: Patient tolerated treatment well Patient left:  (  managing independently) Nurse Communication: Mobility status PT Visit Diagnosis: Muscle weakness (generalized) (M62.81);Other abnormalities of gait and mobility (R26.89)     Time: 1610-9604 PT Time Calculation (min) (ACUTE ONLY): 52 min  Charges:  $Therapeutic Exercise: 8-22 mins $Therapeutic Activity: 8-22 mins $Self Care/Home Management: 8-22                     Van Clines, PT  Acute Rehabilitation Services Office [phone] Secure Chat welcomed    [name] 05/11/2023, 1:34 PM

## 2023-05-11 NOTE — Progress Notes (Signed)
  Progress Note   Patient: [name]:[id number] DOB: [birth year] DOA: 12/07/2022     155 DOS: the patient was seen and examined on 05/11/2023 at 9:16AM      Brief hospital course: [name] is a 57 y.o. M with sCHF EF 40%, CAD, HLD, HTN, history of right BKA and schizoaffective disorder who presnted with left foot gangrene and underwent left BKA on 1/10.  See prior summary by Dr. Marland Mcalpine from 6/4       Assessment and Plan: Sepsis secondary to cellulitis/gangrene of left foot s/p Left BKA Resolved   Anterior Maxillary Abscess Resolved   Chronic Combined Systolic and Diastolic Heart failure Well compensated, EF 40% - Continue Coreg, furosemide, hydralazine   Essential Hypertension Blood pressure controlled - Continue amlodipine, Coreg, furosemide, hydralazine, Imdur   COPD No active flare - Xopenex as needed   Coronary artery disease with chronic angina -Continue Imdur   Chronic pain -Continue MS Contin 30 twice daily - Continue as needed oxycodone, gabapentin, Lexapro - Continue bowel regimen   Schizoaffective Disorder Seen by Psychiatry during hospitalization. -Continue Abilify, Lexapro  Obesity BMI 30           Subjective: Patient was seen today.  No events reported overnight.    Physical Exam: BP (!) 185/96 (BP Location: Left Arm)   Pulse 65   Temp 97.8 F (36.6 C)   Resp 18   Ht 5\' 9"  (1.753 m)   Wt 90 kg   SpO2 98%   BMI 29.30 kg/m   Adult male, sitting up in wheelchair, eating breakfast RRR, no murmurs, no peripheral edema Respiratory normal, lungs clear without rales or wheezes Bilateral BKA, stump skin changes, look clean dry and intact    Data Reviewed: CBC and CMP from a week and a half ago all normal      Disposition: Status is: Inpatient The patient was admitted with gangrene, has had to have amputation of his left neck and is now Bilateral amputations.  Because of his schizophrenia and bilateral  amputations, the patient is unable to return to his prior living arrangement safely and social work continuing to search for disposition  He is medically stable to discharge when disposition is available        Author: Harold Hedge, MD 05/11/2023 12:31 PM  For on call review [email or web address].

## 2023-05-11 NOTE — Plan of Care (Signed)
  Problem: Pain Managment: Goal: General experience of comfort will improve Outcome: Progressing   Problem: Safety: Goal: Ability to remain free from injury will improve Outcome: Progressing   Problem: Skin Integrity: Goal: Risk for impaired skin integrity will decrease Outcome: Progressing   

## 2023-05-12 DIAGNOSIS — I96 Gangrene, not elsewhere classified: Secondary | ICD-10-CM | POA: Diagnosis not present

## 2023-05-12 MED ORDER — ENSURE ENLIVE PO LIQD
237.0000 mL | Freq: Two times a day (BID) | ORAL | Status: DC
  Administered 2023-05-13 – 2023-05-25 (×14): 237 mL via ORAL

## 2023-05-12 NOTE — TOC Progression Note (Signed)
Transition of Care [facility]) - Progression Note    Patient Details  Name: [name] MRN: [id number] Date of Birth: 06-24-1966  Transition of [facility]) CM/SW Contact  Lorri Frederick, [name] Phone Number: 05/12/2023, 11:24 AM  Clinical Narrative:   Pt requested CSW come to his room, Rochelle/Trilliam here to fill out housing application.  She asked for documents--pt had his ID and his social security statement.  Rochelle asking for copy of social security card, which pt did not have.  She will submit housing application.    Expected Discharge Plan: Home/Self Care Barriers to Discharge: Homeless with medical needs  Expected Discharge Plan and Services   Discharge Planning Services: CM Consult Post Acute Care Choice: Home Health Living arrangements for the past 2 months: Homeless                                       Social Determinants of Health (SDOH) Interventions SDOH Screenings   Food Insecurity: Food Insecurity Present (11/24/2022)  Housing: Medium Risk (12/07/2022)  Transportation Needs: Unmet Transportation Needs (12/07/2022)  Utilities: Not At Risk (12/07/2022)  Alcohol Screen: Medium Risk (06/24/2019)  Depression (PHQ2-9): Medium Risk (02/10/2021)  Tobacco Use: High Risk (03/26/2023)    Readmission Risk Interventions    12/09/2022    9:02 AM 12/01/2022    3:56 PM 08/18/2022    9:00 AM  Readmission Risk Prevention Plan  Transportation Screening Complete Complete Complete  Medication Review Oceanographer) Complete Complete Complete  PCP or Specialist appointment within 3-5 days of discharge Complete Complete Complete  HRI or Home Care Consult Complete Complete Complete  SW Recovery Care/Counseling Consult Complete Complete Complete  Palliative Care Screening Not Applicable Not Applicable Not Applicable  Skilled Nursing Facility Not Applicable Not Applicable Not Applicable  SNF Comments  n/a

## 2023-05-12 NOTE — Progress Notes (Signed)
Mobility Specialist Progress Note   05/12/23 0945  Mobility  Activity Ambulated independently in hallway  Level of Assistance Modified independent, requires aide device or extra time  Assistive Device Wheelchair  Distance Ambulated (ft) 550 ft (Propelled)  Range of Motion/Exercises Active;All extremities  Activity Response Tolerated well   Patient propelled in hallway 556ft mod I. Holding exercise/stretches so patient can work with PT. Will return later to follow up.   [place] Orphia Mctigue, BS EXP Mobility Specialist Please contact via SecureChat or Rehab office at [phone]

## 2023-05-12 NOTE — Progress Notes (Signed)
  Progress Note   Patient: [name] ZOX:[id number] DOB: [birth year] DOA: 12/07/2022     156 DOS: the patient was seen and examined on 05/12/2023 at 9:16AM      Brief hospital course: [name] is a 57 y.o. M with sCHF EF 40%, CAD, HLD, HTN, history of right BKA and schizoaffective disorder who presnted with left foot gangrene and underwent left BKA on 1/10.  See prior summary by Dr. Marland Mcalpine from 6/4       Assessment and Plan: Sepsis secondary to cellulitis/gangrene of left foot s/p Left BKA Resolved Hospital stay prolonged due to difficulty in placement due to homelessness,  status post left BKA and right AKA. [facility], have offered to provide a prosthesis for LLE (the BKA).  CSW is in contact with [facility] staff who is working on obtaining patient permanent housing.      Anterior Maxillary Abscess Resolved   Chronic Combined Systolic and Diastolic Heart failure Well compensated, EF 40% - Continue Coreg, furosemide, hydralazine   Essential Hypertension Blood pressure controlled - Continue amlodipine, Coreg, furosemide, hydralazine, Imdur   COPD No active flare - Xopenex as needed   Coronary artery disease with chronic angina -Continue Imdur   Chronic pain -Continue MS Contin 30 twice daily - Continue as needed oxycodone, gabapentin, Lexapro - Continue bowel regimen   Schizoaffective Disorder Seen by Psychiatry during hospitalization. -Continue Abilify, Lexapro  Obesity BMI 30           Subjective: Patient was seen today.  No events reported overnight.  Patient reported that today PD was going to come and work with him to see if the prosthesis will fit his left BKA.    Physical Exam: BP 128/77 (BP Location: Left Arm)   Pulse (!) 56   Temp 98.2 F (36.8 C) (Oral)   Resp 18   Ht 5\' 9"  (1.753 m)   Wt 90 kg   SpO2 98%   BMI 29.30 kg/m   Adult male, sitting up in wheelchair, eating breakfast RRR, no murmurs, no peripheral  edema Respiratory normal, lungs clear without rales or wheezes Bilateral BKA, stump skin changes, look clean dry and intact    Data Reviewed: CBC and CMP from a week and a half ago all normal      Disposition: Status is: Inpatient The patient was admitted with gangrene, has had to have amputation of his left neck and is now Bilateral amputations.  Because of his schizophrenia and bilateral amputations, the patient is unable to return to his prior living arrangement safely and social work continuing to search for disposition  He is medically stable to discharge when disposition is available        Author: Harold Hedge, MD 05/12/2023 12:41 PM  For on call review [email or web address].

## 2023-05-12 NOTE — Progress Notes (Signed)
Physical Therapy Treatment Patient Details Name: [name] MRN: [id number] DOB: 1966-11-30 Today's Date: 05/12/2023   History of Present Illness Pt is a 57 y/o M who presented 12/17/22 with L ankle cellulitis and underwent L BKA on 1/10. Oral abscesses noted, underwent oral surgery Wed, 4/24; Initiated fabrication of prosthesis for LLE on 6/3; PMH includes asthma, bioplar 1, depression, schizoaffective disorder, CVA, chronic CHF, homelessness, HTN, DM2, cocaine and cannabis use, and R knee disarticulation    PT Comments    Continuing work on functional mobility and activity tolerance;  Met with [name], Prosthetist from [facility] for initial socket fit check; [name] stood on the new socket x3 within parallel bars -- the first time he has stood since mid-January; [name] was able to make notes on the socket for adjustments for pressure relief and fit; Plan for another fit session next Monday, 6/17 at noon;   Will teach [name] soft tissue massage at his suture line of distal LLE to help with tolerance; [name] is also to wear the liner for tolerance and to break it in for an hour today (check!), and add an hour of liner war every day until he is wearing the liner most of the day; Still needs to wear the shrinker overnight and when he is not wearing the liner (not while bathing, either);   Will consider soft tissue massage for distal R LE in prep for prosthesis; Would like to request the op report from his amputation in [place] to get a better idea of how to prepare for the prosthesis   Recommendations for follow up therapy are one component of a multi-disciplinary discharge planning process, led by the attending physician.  Recommendations may be updated based on patient status, additional functional criteria and insurance authorization.  Follow Up Recommendations  Can patient physically be transported by private vehicle: Yes (Currently recommend a sliding board for car transfers; may not need  once he gets L prosthesis)    Assistance Recommended at Discharge PRN  Patient can return home with the following Assist for transportation   Equipment Recommendations  Other (comment) (tub bench, sliding board)    Recommendations for Other Services  (Recommend starting the Mental/Behavioral Health support through [name] to start while [name] is here in the hospital; [name] consult)     Precautions / Restrictions Precautions Precautions: None Precaution Comments: R knee disrticulation, L BKA; Fall risk is present, but minimal Restrictions RLE Weight Bearing: Weight bearing as tolerated LLE Weight Bearing: Weight bearing as tolerated     Mobility  Bed Mobility                    Transfers Overall transfer level: Needs assistance (Modified aindependent with wc transfers) Equipment used:  (Parallel bars in 4 MW (AIR) Rehab Gym) Transfers: Sit to/from Stand Sit to Stand: +2 safety/equipment, +2 physical assistance, Mod assist (very heavy Mod assist)           General transfer comment: Stood 3x in parallel bars onto initial socket for his prosthesis; held L single limb stance for 15-25 seconds; multimodal cues for form and for fully extending L hip    Ambulation/Gait                   Stairs             Wheelchair Mobility    Modified Rankin (Stroke Patients Only)       Balance  Cognition Arousal/Alertness: Awake/alert Behavior During Therapy: WFL for tasks assessed/performed Overall Cognitive Status: Within Functional Limits for tasks assessed (for simple mobility tasks)                                 General Comments: When [name] understands rationale for an exercise or activity, and that rationale is in line with his goals, he participates much better        Exercises      General Comments General comments (skin integrity, edema, etc.): seen with  Prosthetist; Educated in liner break-in and wear, continuing shrinker use, and what to expect with L prosthesis training process      Pertinent Vitals/Pain Pain Assessment Pain Assessment: Faces Faces Pain Scale: Hurts even more Pain Location: L distal residual limb with weight bearing in initial prosthesis socket trial    Home Living                          Prior Function            PT Goals (current goals can now be found in the care plan section) Acute Rehab PT Goals Patient Stated Goal: Prepare for prosthetics fitting; Would like to get housing in [facility] through the Transition to [employer] PT Goal Formulation: With patient Time For Goal Achievement: 05/19/23 Potential to Achieve Goals: Good    Frequency    Min 1X/week      PT Plan Current plan remains appropriate;Frequency needs to be updated (When prosthesis training begins, will incr frequency)    Co-evaluation              AM-PAC PT "6 Clicks" Mobility   Outcome Measure  Help needed turning from your back to your side while in a flat bed without using bedrails?: None Help needed moving from lying on your back to sitting on the side of a flat bed without using bedrails?: None Help needed moving to and from a bed to a chair (including a wheelchair)?: None Help needed standing up from a chair using your arms (e.g., wheelchair or bedside chair)?: Total Help needed to walk in hospital room?: Total Help needed climbing 3-5 steps with a railing? : Total 6 Click Score: 15    End of Session Equipment Utilized During Treatment: Other (comment) (initial socket provided by [facility]; sliding board for car transfer) Activity Tolerance: Patient tolerated treatment well Patient left:  (managing independently) Nurse Communication: Mobility status PT Visit Diagnosis: Muscle weakness (generalized) (M62.81);Other abnormalities of gait and mobility (R26.89)     Time: 1240-1310 PT  Time Calculation (min) (ACUTE ONLY): 30 min  Charges:  $Therapeutic Activity: 8-22 mins $Prosthetics Training: 8-28mins                     Van Clines, PT  Acute Rehabilitation Services Office [phone] Secure Chat welcomed    [name] 05/12/2023, 6:01 PM

## 2023-05-13 DIAGNOSIS — I96 Gangrene, not elsewhere classified: Secondary | ICD-10-CM | POA: Diagnosis not present

## 2023-05-13 DIAGNOSIS — L03116 Cellulitis of left lower limb: Secondary | ICD-10-CM | POA: Diagnosis not present

## 2023-05-13 DIAGNOSIS — L0390 Cellulitis, unspecified: Secondary | ICD-10-CM | POA: Diagnosis not present

## 2023-05-13 DIAGNOSIS — Z5900 Homelessness unspecified: Secondary | ICD-10-CM | POA: Diagnosis not present

## 2023-05-13 MED ORDER — CAMPHOR-MENTHOL 0.5-0.5 % EX LOTN: TOPICAL_LOTION | CUTANEOUS | Status: DC | PRN

## 2023-05-13 MED ORDER — LIDOCAINE 5 % EX PTCH
1.0000 | MEDICATED_PATCH | CUTANEOUS | Status: DC
  Administered 2023-05-13 – 2023-05-21 (×8): 1 via TRANSDERMAL
  Filled 2023-05-13 (×9): qty 1

## 2023-05-13 NOTE — Progress Notes (Signed)
PROGRESS NOTE    [name]  ZOX:[id number] DOB: [birth year] DOA: 12/07/2022 PCP: [name], NP   Brief Narrative:  [name] is a 57 y.o. male with a history of heart failure, CAD, right BKA, COPD, hyperlipidemia, hyperlipidemia, polyneuropathy, schizoaffective/bipolar disorder, was admitted to the hospital with left lower extremity cellulitis and gangrene.   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      During hospitalization, patient was also noted to have maxillary abscess and periapical abscess and underwent oral surgery with I&D and of anterior maxillary abscess and surgical extraction of multiple teeth in alveoloplasty. Currently stable.   Hospital stay prolonged due to difficulty in placement due to homelessness status status post left BKA and right AKA.   [facility],  have offered to provide a prosthesis for LLE (the BKA)   The patient scheduled an appointment Monday (June 3rd, 2024) at noon to meet up with patient and the Prosthetist to cast the L residual limb and make a plan and patient to receive a prosthesis for his left leg pro bono.  **Patient met with the Prosthetist for his Left Leg who brought initial test socket and will bring another test socket this week (05/18/23)and the patient is to wear the shrinker at all times except when bathing and can wear the shrinker with 2 layers to above his knee or with 1 layer above the knee in 3 to 4 layers more distally to apply more pressure for distal limb shaping.  Per the physical therapist he is to wear the liner during waking hours with initially starting 2 to 3 hours aware and then adding half an hour of wear each day until the patient gets used to wearing the liner.  Still has a difficult disposition given housing issue but CSW is involved.    Assessment and Plan:  Sepsis secondary to cellulitis/gangrene of left foot s/p Left BKA -Initially received  vancomycin, Cefepime and Flagyl.  -Patient underwent 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   -Previous history of right below-knee amputation.    -Patient is wheelchair-bound.  04/27/2023: Stable.  Patient is awaiting disposition. -Patient had furosemide for stump edema and electrolytes were checked and potassium was low normal so we will replete -Discussed with physical therapy and patient is to get a prosthesis for his left side pro bono next week and Prosthetist consult has been placed; Plan is to meet with Prosthetist on Monday June 3rd at noon to cast for LLE mold. -PT assisting patient in donning LLE Shrinker and demonstrated how to don shrinker on RLE    Anterior Maxillary Abscess -Status post oral surgery with I&D of anterior maxillary abscess, surgical extraction of teeth 4,5,6,11,12,13,20,21,22,27,28,29. Simple extraction teeth #7,8,9,10,23,24,25,26, Alveoloplasty x 4 quadrants on 03/25/23 Dr [name].   -Patient has completed Augmentin x 7 days.     -Currently on regular fluid consistency diet.  Continue oral hygiene and continue with benzocaine mucosal gel for the mouth and throat 4 times daily as needed for mild pain   Chronic Combined Systolic and Diastolic Heart failure -Compensated.   -Patient on Carvedilol 6.25 g p.o. twice daily, Amlodipine 10 mg p.o. daily, Furosemide 20 mg p.o. daily, and Isosorbide mononitrate 50 mg p.o. daily along with Hydralazine 25 mg every 8 -2D echocardiogram from 08/2022 with LV ejection fraction of 40% with global hypokinesis.  -Strict I's and O's and daily weights No intake or output data in the 24 hours ending 05/13/23 0815 -  Continue diuresis with furosemide 20 mg po daily and continue monitor for signs and symptoms of volume overload  Essential Hypertension -on Carvedilol 6.25 g p.o. twice daily, Amlodipine 10 mg p.o. daily, Furosemide 20 mg p.o. daily, and Isosorbide Mononitrate 50 mg p.o. daily along with Hydralazine 25 mg every 8 -Continue to  monitor blood pressures per protocol -Last BP reading was elevated at 111/83   COPD -Compensated. Continue Xopenex 0.63 mg nebs every 6 as needed  -Continue to monitor respiratory status carefully SpO2: 99 % O2 Flow Rate (L/min): 0 L/min   Anxiety -No longer appears to be on Atarax but if necessary will reorder   GERD/GI prophylaxis -Does not want to take Pantoprazole 40 mg po Daily so Discontinued     Constipation -Continue Bowel Regimen with MiraLAX , Dulcolax PRN.  On narcotics   Chronic pain -On MS Contin 30 mg every 12 scheduled and has oxycodone 10 mg p.o. every 4 as needed for severe pain for 7-10 as well as tramadol 50 mg p.o. every 12 as needed for moderate pain and also on gabapentin 900 mg p.o. 3 times daily -To be supervised for concerns of previous hoarding medication. -Continue with bowel regimen with bisacodyl 5 mg p.o. daily as needed for moderate constipation, and MiraLAX 17 g p.o. daily as needed along with magnesium citrate 1 bottle once as needed for severe constipation  Shoulder Pain -Patient having shoulder discomfort so will add Lidocaine Patch and Camphor Topical   Schizoaffective Disorder -Seen by Psychiatry during hospitalization. -Continue Aripiprazole 5 mg p.o. daily and Escitalopram 5 mg p.o. Daily.    Obesity -Complicates overall prognosis and care -Estimated body mass index is 29.3 kg/m as calculated from the following:   Height as of this encounter: 5\' 9"  (1.753 m).   Weight as of this encounter: 90 kg.  -Weight Loss and Dietary Counseling given   DVT prophylaxis: enoxaparin (LOVENOX) injection 40 mg Start: 02/25/23 2200 SCD's Start: 12/10/22 1731    Code Status: Full Code Family Communication: No family present at bedside  Disposition Plan:  Level of care: Med-Surg Status is: Inpatient Remains inpatient appropriate because: Has an unsafe discharge disposition and TOC assisting with discharge disposition given lack of housing     Consultants:  Psychiatry Orthopedic surgery Podiatry  Procedures:  As delineated as above  Antimicrobials:  Anti-infectives (From admission, onward)    Start     Dose/Rate Route Frequency Ordered Stop   03/19/23 0900  amoxicillin-clavulanate (AUGMENTIN) 875-125 MG per tablet 1 tablet        1 tablet Oral Every 12 hours 03/19/23 0809 04/01/23 2118   03/18/23 2000  Ampicillin-Sulbactam (UNASYN) 3 g in sodium chloride 0.9 % 100 mL IVPB  Status:  Discontinued        3 g 200 mL/hr over 30 Minutes Intravenous Every 6 hours 03/18/23 1939 03/19/23 0809   03/18/23 1645  Ampicillin-Sulbactam (UNASYN) 3 g in sodium chloride 0.9 % 100 mL IVPB  Status:  Discontinued        3 g 200 mL/hr over 30 Minutes Intravenous Every 6 hours 03/18/23 1555 03/18/23 1939   12/26/22 2200  doxycycline (VIBRA-TABS) tablet 100 mg        100 mg Oral Every 12 hours 12/26/22 1307 01/08/23 2126   12/24/22 1000  doxycycline (VIBRA-TABS) tablet 100 mg  Status:  Discontinued        100 mg Oral Every 12 hours 12/24/22 0715 12/26/22 1307   12/10/22 1830  ceFAZolin (ANCEF)  IVPB 2g/100 mL premix  Status:  Discontinued        2 g 200 mL/hr over 30 Minutes Intravenous Every 8 hours 12/10/22 1731 12/10/22 1740   12/10/22 1330  ceFAZolin (ANCEF) IVPB 2g/100 mL premix        2 g 200 mL/hr over 30 Minutes Intravenous On call to O.R. 12/10/22 0959 12/10/22 1534   12/08/22 0530  vancomycin (VANCOREADY) IVPB 1500 mg/300 mL        1,500 mg 150 mL/hr over 120 Minutes Intravenous Every 12 hours 12/07/22 1741 12/11/22 0955   12/08/22 0345  metroNIDAZOLE (FLAGYL) IVPB 500 mg        500 mg 100 mL/hr over 60 Minutes Intravenous Every 12 hours 12/07/22 1742 12/12/22 0344   12/07/22 2330  ceFEPIme (MAXIPIME) 2 g in sodium chloride 0.9 % 100 mL IVPB        2 g 200 mL/hr over 30 Minutes Intravenous Every 8 hours 12/07/22 1741 12/11/22 1529   12/07/22 2200  metroNIDAZOLE (FLAGYL) IVPB 500 mg  Status:  Discontinued        500 mg 100 mL/hr  over 60 Minutes Intravenous Every 12 hours 12/07/22 2048 12/07/22 2056   12/07/22 1530  ceFEPIme (MAXIPIME) 2 g in sodium chloride 0.9 % 100 mL IVPB        2 g 200 mL/hr over 30 Minutes Intravenous  Once 12/07/22 1516 12/07/22 1734   12/07/22 1530  vancomycin (VANCOREADY) IVPB 1500 mg/300 mL        1,500 mg 150 mL/hr over 120 Minutes Intravenous  Once 12/07/22 1516 12/09/22 0333   12/07/22 1515  metroNIDAZOLE (FLAGYL) IVPB 500 mg        500 mg 100 mL/hr over 60 Minutes Intravenous  Once 12/07/22 1506 12/07/22 1734   12/07/22 1500  ceFAZolin (ANCEF) IVPB 1 g/50 mL premix  Status:  Discontinued        1 g 100 mL/hr over 30 Minutes Intravenous  Once 12/07/22 1452 12/07/22 1513       Subjective: Seen and examined at bedside and he was resting in the bed and wanting to sleep.  States that he is having some back and shoulder pain.  No nausea or vomiting.  Doing okay otherwise.  No other concerns or complaints this time.  Objective: Vitals:   05/12/23 1434 05/12/23 2216 05/13/23 0838 05/13/23 1244  BP: 109/72 111/83 132/80 110/71  Pulse: 65 72 68 77  Resp: 16 16 18 18   Temp: 98.2 F (36.8 C) 97.9 F (36.6 C) 98.6 F (37 C) 98.2 F (36.8 C)  TempSrc: Oral Oral    SpO2: 99% 99% 96% 93%  Weight:      Height:       No intake or output data in the 24 hours ending 05/13/23 1657 Filed Weights   05/09/23 0500 05/11/23 0428 05/12/23 1610  Weight: 90.7 kg 90 kg 90 kg    Examination: Physical Exam:  Constitutional: WN/WD chronically ill-appearing African-American male in no acute distress appears calm Respiratory: Diminished to auscultation bilaterally, no wheezing, rales, rhonchi or crackles. Normal respiratory effort and patient is not tachypenic. No accessory muscle use.  Unlabored breathing Cardiovascular: RRR, no murmurs / rubs / gallops. S1 and S2 auscultated.  Wearing a stump shrinker Abdomen: Soft, non-tender, distended secondary to body habitus. Bowel sounds positive.  GU:  Deferred. Musculoskeletal: No clubbing / cyanosis of digits/nails.  Has a right AKA and a left BKA Skin: No rashes, lesions, ulcers on a limited  skin evaluation. No induration; Warm and dry.  Neurologic: CN 2-12 grossly intact with no focal deficits.  Romberg sign and cerebellar reflexes not assessed.  Psychiatric: Normal judgment and insight. Alert and oriented x 3. Normal mood and appropriate affect.   Data Reviewed: I have personally reviewed following labs and imaging studies  CBC: No results for input(s): "WBC", "NEUTROABS", "HGB", "HCT", "MCV", "PLT" in the last 168 hours. Basic Metabolic Panel: No results for input(s): "NA", "K", "CL", "CO2", "GLUCOSE", "BUN", "CREATININE", "CALCIUM", "MG", "PHOS" in the last 168 hours. GFR: Estimated Creatinine Clearance: 108.9 mL/min (by C-G formula based on SCr of 0.85 mg/dL). Liver Function Tests: No results for input(s): "AST", "ALT", "ALKPHOS", "BILITOT", "PROT", "ALBUMIN" in the last 168 hours. No results for input(s): "LIPASE", "AMYLASE" in the last 168 hours. No results for input(s): "AMMONIA" in the last 168 hours. Coagulation Profile: No results for input(s): "INR", "PROTIME" in the last 168 hours. Cardiac Enzymes: No results for input(s): "CKTOTAL", "CKMB", "CKMBINDEX", "TROPONINI" in the last 168 hours. BNP (last 3 results) No results for input(s): "PROBNP" in the last 8760 hours. HbA1C: No results for input(s): "HGBA1C" in the last 72 hours. CBG: No results for input(s): "GLUCAP" in the last 168 hours. Lipid Profile: No results for input(s): "CHOL", "HDL", "LDLCALC", "TRIG", "CHOLHDL", "LDLDIRECT" in the last 72 hours. Thyroid Function Tests: No results for input(s): "TSH", "T4TOTAL", "FREET4", "T3FREE", "THYROIDAB" in the last 72 hours. Anemia Panel: No results for input(s): "VITAMINB12", "FOLATE", "FERRITIN", "TIBC", "IRON", "RETICCTPCT" in the last 72 hours. Sepsis Labs: No results for input(s): "PROCALCITON", "LATICACIDVEN"  in the last 168 hours.  No results found for this or any previous visit (from the past 240 hour(s)).   Radiology Studies: No results found.  Scheduled Meds:  amLODipine  10 mg Oral Daily   ARIPiprazole  5 mg Oral Daily   vitamin C  1,000 mg Oral Daily   carvedilol  6.25 mg Oral BID WC   chlorhexidine  15 mL Mouth/Throat TID   enoxaparin (LOVENOX) injection  40 mg Subcutaneous Q24H   escitalopram  5 mg Oral Daily   feeding supplement  237 mL Oral BID BM   furosemide  20 mg Oral Daily   gabapentin  900 mg Oral TID   hydrALAZINE  25 mg Oral Q8H   isosorbide mononitrate  15 mg Oral Daily   lidocaine  1 patch Transdermal Q24H   melatonin  3 mg Oral QHS   morphine  30 mg Oral Q12H   multivitamin with minerals  1 tablet Oral Daily   nystatin  5 mL Oral QID   Continuous Infusions:   LOS: 157 days   Marguerita Merles, DO [facility] Available via Epic secure chat 7am-7pm After these hours, please refer to coverage provider listed on [email or web address] 05/13/2023, 4:57 PM

## 2023-05-13 NOTE — Progress Notes (Signed)
Mobility Specialist Progress Note   05/13/23 1425  Mobility  Activity Transferred from bed to chair (BLE exercise and AROM. BUE exercise and AROM)  Level of Assistance Modified independent, requires aide device or extra time  Assistive Device Wheelchair  Range of Motion/Exercises Active;All extremities  Activity Response Tolerated well   Patient received in [employer] and agreeable to participate. Completed BUE and BLE AROM and exercises with focus hip flexion and gluteal activation to improve strength and flexibility with standing. Also completed re-edu on prosthetic sleeve/liner with receptive teach back.   Tolerated without complaint or incident. Was left in [facility] with all needs met.  [place] Berneita Sanagustin, BS EXP Mobility Specialist Please contact via SecureChat or Rehab office at [phone]

## 2023-05-13 NOTE — Plan of Care (Signed)
  Problem: Pain Managment: Goal: General experience of comfort will improve Outcome: Progressing   Problem: Safety: Goal: Ability to remain free from injury will improve Outcome: Progressing   Problem: Skin Integrity: Goal: Risk for impaired skin integrity will decrease Outcome: Progressing   

## 2023-05-14 DIAGNOSIS — L0390 Cellulitis, unspecified: Secondary | ICD-10-CM | POA: Diagnosis not present

## 2023-05-14 DIAGNOSIS — I96 Gangrene, not elsewhere classified: Secondary | ICD-10-CM | POA: Diagnosis not present

## 2023-05-14 DIAGNOSIS — Z5900 Homelessness unspecified: Secondary | ICD-10-CM | POA: Diagnosis not present

## 2023-05-14 DIAGNOSIS — L03116 Cellulitis of left lower limb: Secondary | ICD-10-CM | POA: Diagnosis not present

## 2023-05-14 NOTE — Progress Notes (Signed)
Mobility Specialist Progress Note   05/14/23 1420  Mobility  Activity Transferred from bed to chair;Transferred from chair to bed (BLE & BUE exercises)  Level of Assistance Modified independent, requires aide device or extra time  Assistive Device None  Range of Motion/Exercises Active;All extremities  Activity Response Tolerated well   Patient received in wheel chair and agreeable to participate. Transferred to mat table independently and completed various BLE and BUE exercises with supervision. After completion of exercises, performed suture massage with education for better weight tolerance with weight bearing. Tolerated without complaint or incident. Was left in wheel chair with all needs met.  [place] Chayton Murata, BS EXP Mobility Specialist Please contact via SecureChat or Rehab office at [phone]

## 2023-05-14 NOTE — Progress Notes (Signed)
PROGRESS NOTE    [name]  WUX:[id number] DOB: 1966-05-17 DOA: 12/07/2022 PCP: [name], NP   Brief Narrative:  [name] is a 57 y.o. male with a history of heart failure, CAD, right BKA, COPD, hyperlipidemia, hyperlipidemia, polyneuropathy, schizoaffective/bipolar disorder, was admitted to the hospital with left lower extremity cellulitis and gangrene.   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      During hospitalization, patient was also noted to have maxillary abscess and periapical abscess and underwent oral surgery with I&D and of anterior maxillary abscess and surgical extraction of multiple teeth in alveoloplasty. Currently stable.   Hospital stay prolonged due to difficulty in placement due to homelessness status status post left BKA and right AKA.   [facility],  have offered to provide a prosthesis for LLE (the BKA)   The patient scheduled an appointment Monday (June 3rd, 2024) at noon to meet up with patient and the Prosthetist to cast the L residual limb and make a plan and patient to receive a prosthesis for his left leg pro bono.  **Patient met with the Prosthetist for his Left Leg who brought initial test socket and will bring another test socket this week (05/18/23)and the patient is to wear the shrinker at all times except when bathing and can wear the shrinker with 2 layers to above his knee or with 1 layer above the knee in 3 to 4 layers more distally to apply more pressure for distal limb shaping.  Per the physical therapist he is to wear the liner during waking hours with initially starting 2 to 3 hours aware and then adding half an hour of wear each day until the patient gets used to wearing the liner.  Still has a difficult disposition given housing issue but CSW is involved.    Assessment and Plan:  Sepsis secondary to cellulitis/gangrene of left foot s/p Left BKA -Initially received  vancomycin, Cefepime and Flagyl.  -Patient underwent 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   -Previous history of right below-knee amputation.    -Patient is wheelchair-bound.  04/27/2023: Stable.  Patient is awaiting disposition. -Patient had furosemide for stump edema and electrolytes were checked and potassium was low normal so we will replete -Discussed with physical therapy and patient is to get a prosthesis for his left side pro bono next week and Prosthetist consult has been placed; Plan is to meet with Prosthetist on Monday June 3rd at noon to cast for LLE mold. -PT assisting patient in donning LLE Shrinker and demonstrated how to don shrinker on RLE    Anterior Maxillary Abscess -Status post oral surgery with I&D of anterior maxillary abscess, surgical extraction of teeth 4,5,6,11,12,13,20,21,22,27,28,29. Simple extraction teeth #7,8,9,10,23,24,25,26, Alveoloplasty x 4 quadrants on 03/25/23 Dr [name].   -Patient has completed Augmentin x 7 days.     -Currently on regular fluid consistency diet.  Continue oral hygiene and continue with benzocaine mucosal gel for the mouth and throat 4 times daily as needed for mild pain   Chronic Combined Systolic and Diastolic Heart failure -Compensated.   -Patient on Carvedilol 6.25 g p.o. twice daily, Amlodipine 10 mg p.o. daily, Furosemide 20 mg p.o. daily, and Isosorbide mononitrate 50 mg p.o. daily along with Hydralazine 25 mg every 8 -2D echocardiogram from 08/2022 with LV ejection fraction of 40% with global hypokinesis.  -Strict I's and O's and daily weights No intake or output data in the 24 hours ending 05/14/23 0823 -  Continue diuresis with furosemide 20 mg po daily and continue monitor for signs and symptoms of volume overload  Essential Hypertension -on Carvedilol 6.25 g p.o. twice daily, Amlodipine 10 mg p.o. daily, Furosemide 20 mg p.o. daily, and Isosorbide Mononitrate 50 mg p.o. daily along with Hydralazine 25 mg every 8 -Continue to  monitor blood pressures per protocol -Last BP reading was elevated at 137/86   COPD -Compensated. Continue Xopenex 0.63 mg nebs every 6 as needed  -Continue to monitor respiratory status carefully SpO2: 96 % O2 Flow Rate (L/min): 0 L/min   Anxiety -No longer appears to be on Atarax but if necessary will reorder   GERD/GI prophylaxis -Does not want to take Pantoprazole 40 mg po Daily so Discontinued     Constipation -Continue Bowel Regimen with MiraLAX , Dulcolax PRN.  On narcotics   Chronic pain -On MS Contin 30 mg every 12 scheduled and has oxycodone 10 mg p.o. every 4 as needed for severe pain for 7-10 as well as tramadol 50 mg p.o. every 12 as needed for moderate pain and also on gabapentin 900 mg p.o. 3 times daily -To be supervised for concerns of previous hoarding medication. -Continue with bowel regimen with bisacodyl 5 mg p.o. daily as needed for moderate constipation, and MiraLAX 17 g p.o. daily as needed along with magnesium citrate 1 bottle once as needed for severe constipation  Shoulder Pain, improving  -Patient having shoulder discomfort so will add Lidocaine Patch and Camphor Topical   Schizoaffective Disorder -Seen by Psychiatry during hospitalization. -Continue Aripiprazole 5 mg p.o. daily and Escitalopram 5 mg p.o. Daily.    Obesity -Complicates overall prognosis and care -Estimated body mass index is 29.3 kg/m as calculated from the following:   Height as of this encounter: 5\' 9"  (1.753 m).   Weight as of this encounter: 90 kg.  -Weight Loss and Dietary Counseling given   DVT prophylaxis: enoxaparin (LOVENOX) injection 40 mg Start: 02/25/23 2200 SCD's Start: 12/10/22 1731    Code Status: Full Code Family Communication: No family currently at bedside  Disposition Plan:  Level of care: Med-Surg Status is: Inpatient Remains inpatient appropriate because: Has an unsafe discharge disposition and TOC assisting with discharge disposition given lack of  housing    Consultants:  Psychiatry Orthopedic surgery Podiatry  Procedures:  As delineated as above  Antimicrobials:  Anti-infectives (From admission, onward)    Start     Dose/Rate Route Frequency Ordered Stop   03/19/23 0900  amoxicillin-clavulanate (AUGMENTIN) 875-125 MG per tablet 1 tablet        1 tablet Oral Every 12 hours 03/19/23 0809 04/01/23 2118   03/18/23 2000  Ampicillin-Sulbactam (UNASYN) 3 g in sodium chloride 0.9 % 100 mL IVPB  Status:  Discontinued        3 g 200 mL/hr over 30 Minutes Intravenous Every 6 hours 03/18/23 1939 03/19/23 0809   03/18/23 1645  Ampicillin-Sulbactam (UNASYN) 3 g in sodium chloride 0.9 % 100 mL IVPB  Status:  Discontinued        3 g 200 mL/hr over 30 Minutes Intravenous Every 6 hours 03/18/23 1555 03/18/23 1939   12/26/22 2200  doxycycline (VIBRA-TABS) tablet 100 mg        100 mg Oral Every 12 hours 12/26/22 1307 01/08/23 2126   12/24/22 1000  doxycycline (VIBRA-TABS) tablet 100 mg  Status:  Discontinued        100 mg Oral Every 12 hours 12/24/22 0715 12/26/22 1307   12/10/22 1830  ceFAZolin (ANCEF) IVPB 2g/100 mL premix  Status:  Discontinued        2 g 200 mL/hr over 30 Minutes Intravenous Every 8 hours 12/10/22 1731 12/10/22 1740   12/10/22 1330  ceFAZolin (ANCEF) IVPB 2g/100 mL premix        2 g 200 mL/hr over 30 Minutes Intravenous On call to O.R. 12/10/22 0959 12/10/22 1534   12/08/22 0530  vancomycin (VANCOREADY) IVPB 1500 mg/300 mL        1,500 mg 150 mL/hr over 120 Minutes Intravenous Every 12 hours 12/07/22 1741 12/11/22 0955   12/08/22 0345  metroNIDAZOLE (FLAGYL) IVPB 500 mg        500 mg 100 mL/hr over 60 Minutes Intravenous Every 12 hours 12/07/22 1742 12/12/22 0344   12/07/22 2330  ceFEPIme (MAXIPIME) 2 g in sodium chloride 0.9 % 100 mL IVPB        2 g 200 mL/hr over 30 Minutes Intravenous Every 8 hours 12/07/22 1741 12/11/22 1529   12/07/22 2200  metroNIDAZOLE (FLAGYL) IVPB 500 mg  Status:  Discontinued        500  mg 100 mL/hr over 60 Minutes Intravenous Every 12 hours 12/07/22 2048 12/07/22 2056   12/07/22 1530  ceFEPIme (MAXIPIME) 2 g in sodium chloride 0.9 % 100 mL IVPB        2 g 200 mL/hr over 30 Minutes Intravenous  Once 12/07/22 1516 12/07/22 1734   12/07/22 1530  vancomycin (VANCOREADY) IVPB 1500 mg/300 mL        1,500 mg 150 mL/hr over 120 Minutes Intravenous  Once 12/07/22 1516 12/09/22 0333   12/07/22 1515  metroNIDAZOLE (FLAGYL) IVPB 500 mg        500 mg 100 mL/hr over 60 Minutes Intravenous  Once 12/07/22 1506 12/07/22 1734   12/07/22 1500  ceFAZolin (ANCEF) IVPB 1 g/50 mL premix  Status:  Discontinued        1 g 100 mL/hr over 30 Minutes Intravenous  Once 12/07/22 1452 12/07/22 1513       Subjective: Seen and examined in the sitting in his wheelchair and had no issues.  Denies any nausea or vomiting.  Was complaining that they try to get blood from this morning.  No other concerns or complaints at this time.  Objective: Vitals:   05/13/23 1244 05/13/23 1947 05/14/23 0925 05/14/23 1241  BP: 110/71 137/86 (!) 151/79 (!) 170/90  Pulse: 77 63 63 71  Resp: 18 17 18 18   Temp: 98.2 F (36.8 C) 98.7 F (37.1 C) 98 F (36.7 C) 97.9 F (36.6 C)  TempSrc:    Oral  SpO2: 93% 96% 95% 98%  Weight:      Height:       No intake or output data in the 24 hours ending 05/14/23 1545 Filed Weights   05/09/23 0500 05/11/23 0428 05/12/23 1610  Weight: 90.7 kg 90 kg 90 kg   Examination: Physical Exam:  Constitutional: WN/WD chronically ill-appearing African-American male in no acute distress appears calm sitting in wheelchair Respiratory: Diminished to auscultation bilaterally, no wheezing, rales, rhonchi or crackles. Normal respiratory effort and patient is not tachypenic. No accessory muscle use and has Unlabored breathing Cardiovascular: RRR, no murmurs / rubs / gallops. S1 and S2 auscultated.  Wearing stump shrinker Abdomen: Soft, non-tender, distended secondary body habitus. Bowel  sounds positive.  GU: Deferred. Musculoskeletal: No clubbing / cyanosis of digits/nails. Has a Right AKA and Left BKA Skin: No rashes, lesions, ulcers on a limited skin  evaluation. No induration; Warm and dry.  Neurologic: CN 2-12 grossly intact with no focal deficits. Romberg sign and cerebellar reflexes not assessed.  Psychiatric: Normal judgment and insight. Alert and oriented x 3. Normal mood and appropriate affect.   Data Reviewed: I have personally reviewed following labs and imaging studies  CBC: No results for input(s): "WBC", "NEUTROABS", "HGB", "HCT", "MCV", "PLT" in the last 168 hours. Basic Metabolic Panel: No results for input(s): "NA", "K", "CL", "CO2", "GLUCOSE", "BUN", "CREATININE", "CALCIUM", "MG", "PHOS" in the last 168 hours. GFR: Estimated Creatinine Clearance: 108.9 mL/min (by C-G formula based on SCr of 0.85 mg/dL). Liver Function Tests: No results for input(s): "AST", "ALT", "ALKPHOS", "BILITOT", "PROT", "ALBUMIN" in the last 168 hours. No results for input(s): "LIPASE", "AMYLASE" in the last 168 hours. No results for input(s): "AMMONIA" in the last 168 hours. Coagulation Profile: No results for input(s): "INR", "PROTIME" in the last 168 hours. Cardiac Enzymes: No results for input(s): "CKTOTAL", "CKMB", "CKMBINDEX", "TROPONINI" in the last 168 hours. BNP (last 3 results) No results for input(s): "PROBNP" in the last 8760 hours. HbA1C: No results for input(s): "HGBA1C" in the last 72 hours. CBG: No results for input(s): "GLUCAP" in the last 168 hours. Lipid Profile: No results for input(s): "CHOL", "HDL", "LDLCALC", "TRIG", "CHOLHDL", "LDLDIRECT" in the last 72 hours. Thyroid Function Tests: No results for input(s): "TSH", "T4TOTAL", "FREET4", "T3FREE", "THYROIDAB" in the last 72 hours. Anemia Panel: No results for input(s): "VITAMINB12", "FOLATE", "FERRITIN", "TIBC", "IRON", "RETICCTPCT" in the last 72 hours. Sepsis Labs: No results for input(s):  "PROCALCITON", "LATICACIDVEN" in the last 168 hours.  No results found for this or any previous visit (from the past 240 hour(s)).   Radiology Studies: No results found.  Scheduled Meds:  amLODipine  10 mg Oral Daily   ARIPiprazole  5 mg Oral Daily   vitamin C  1,000 mg Oral Daily   carvedilol  6.25 mg Oral BID WC   chlorhexidine  15 mL Mouth/Throat TID   enoxaparin (LOVENOX) injection  40 mg Subcutaneous Q24H   escitalopram  5 mg Oral Daily   feeding supplement  237 mL Oral BID BM   furosemide  20 mg Oral Daily   gabapentin  900 mg Oral TID   hydrALAZINE  25 mg Oral Q8H   isosorbide mononitrate  15 mg Oral Daily   lidocaine  1 patch Transdermal Q24H   melatonin  3 mg Oral QHS   morphine  30 mg Oral Q12H   multivitamin with minerals  1 tablet Oral Daily   nystatin  5 mL Oral QID   Continuous Infusions:   LOS: 158 days   Marguerita Merles, DO [facility] Available via Epic secure chat 7am-7pm After these hours, please refer to coverage provider listed on [email or web address] 05/14/2023, 3:45 PM

## 2023-05-15 DIAGNOSIS — I96 Gangrene, not elsewhere classified: Secondary | ICD-10-CM | POA: Diagnosis not present

## 2023-05-15 DIAGNOSIS — L0390 Cellulitis, unspecified: Secondary | ICD-10-CM | POA: Diagnosis not present

## 2023-05-15 DIAGNOSIS — L03116 Cellulitis of left lower limb: Secondary | ICD-10-CM | POA: Diagnosis not present

## 2023-05-15 DIAGNOSIS — Z5900 Homelessness unspecified: Secondary | ICD-10-CM | POA: Diagnosis not present

## 2023-05-15 NOTE — Progress Notes (Signed)
PROGRESS NOTE    [name]  WUX:[id number] DOB: 1966-05-17 DOA: 12/07/2022 PCP: [name], NP   Brief Narrative:  [name] is a 57 y.o. male with a history of heart failure, CAD, right BKA, COPD, hyperlipidemia, hyperlipidemia, polyneuropathy, schizoaffective/bipolar disorder, was admitted to the hospital with left lower extremity cellulitis and gangrene.   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      During hospitalization, patient was also noted to have maxillary abscess and periapical abscess and underwent oral surgery with I&D and of anterior maxillary abscess and surgical extraction of multiple teeth in alveoloplasty. Currently stable.   Hospital stay prolonged due to difficulty in placement due to homelessness status status post left BKA and right AKA.   [facility],  have offered to provide a prosthesis for LLE (the BKA)   The patient scheduled an appointment Monday (June 3rd, 2024) at noon to meet up with patient and the Prosthetist to cast the L residual limb and make a plan and patient to receive a prosthesis for his left leg pro bono.  **Patient met with the Prosthetist for his Left Leg who brought initial test socket and will bring another test socket this week (05/18/23)and the patient is to wear the shrinker at all times except when bathing and can wear the shrinker with 2 layers to above his knee or with 1 layer above the knee in 3 to 4 layers more distally to apply more pressure for distal limb shaping.  Per the physical therapist he is to wear the liner during waking hours with initially starting 2 to 3 hours aware and then adding half an hour of wear each day until the patient gets used to wearing the liner.  Still has a difficult disposition given housing issue but CSW is involved.    Assessment and Plan:  Sepsis secondary to cellulitis/gangrene of left foot s/p Left BKA -Initially received  vancomycin, Cefepime and Flagyl.  -Patient underwent 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   -Previous history of right below-knee amputation.    -Patient is wheelchair-bound.  04/27/2023: Stable.  Patient is awaiting disposition. -Patient had furosemide for stump edema and electrolytes were checked and potassium was low normal so we will replete -Discussed with physical therapy and patient is to get a prosthesis for his left side pro bono next week and Prosthetist consult has been placed; Plan is to meet with Prosthetist on Monday June 3rd at noon to cast for LLE mold. -PT assisting patient in donning LLE Shrinker and demonstrated how to don shrinker on RLE    Anterior Maxillary Abscess -Status post oral surgery with I&D of anterior maxillary abscess, surgical extraction of teeth 4,5,6,11,12,13,20,21,22,27,28,29. Simple extraction teeth #7,8,9,10,23,24,25,26, Alveoloplasty x 4 quadrants on 03/25/23 Dr [name].   -Patient has completed Augmentin x 7 days.     -Currently on regular fluid consistency diet.  Continue oral hygiene and continue with benzocaine mucosal gel for the mouth and throat 4 times daily as needed for mild pain   Chronic Combined Systolic and Diastolic Heart failure -Compensated.   -Patient on Carvedilol 6.25 g p.o. twice daily, Amlodipine 10 mg p.o. daily, Furosemide 20 mg p.o. daily, and Isosorbide mononitrate 50 mg p.o. daily along with Hydralazine 25 mg every 8 -2D echocardiogram from 08/2022 with LV ejection fraction of 40% with global hypokinesis.  -Strict I's and O's and daily weights No intake or output data in the 24 hours ending 05/14/23 0823 -  Continue diuresis with furosemide 20 mg po daily and continue monitor for signs and symptoms of volume overload  Essential Hypertension -on Carvedilol 6.25 g p.o. twice daily, Amlodipine 10 mg p.o. daily, Furosemide 20 mg p.o. daily, and Isosorbide Mononitrate 50 mg p.o. daily along with Hydralazine 25 mg every 8 -Continue to  monitor blood pressures per protocol -Last BP reading was elevated at 134/84   COPD -Compensated. Continue Xopenex 0.63 mg nebs every 6 as needed  -Continue to monitor respiratory status carefully SpO2: 96 % O2 Flow Rate (L/min): 0 L/min   Anxiety -No longer appears to be on Atarax but if necessary will reorder   GERD/GI prophylaxis -Does not want to take Pantoprazole 40 mg po Daily so Discontinued     Constipation -Continue Bowel Regimen with MiraLAX , Dulcolax PRN.  On narcotics   Chronic pain -On MS Contin 30 mg every 12 scheduled and has oxycodone 10 mg p.o. every 4 as needed for severe pain for 7-10 as well as tramadol 50 mg p.o. every 12 as needed for moderate pain and also on gabapentin 900 mg p.o. 3 times daily -To be supervised for concerns of previous hoarding medication. -Continue with bowel regimen with bisacodyl 5 mg p.o. daily as needed for moderate constipation, and MiraLAX 17 g p.o. daily as needed along with magnesium citrate 1 bottle once as needed for severe constipation  Shoulder Pain, improving  -Patient having shoulder discomfort so will add Lidocaine Patch and Camphor Topical   Schizoaffective Disorder -Seen by Psychiatry during hospitalization. -Continue Aripiprazole 5 mg p.o. daily and Escitalopram 5 mg p.o. Daily.    Obesity -Complicates overall prognosis and care -Estimated body mass index is 29.3 kg/m as calculated from the following:   Height as of this encounter: 5\' 9"  (1.753 m).   Weight as of this encounter: 90 kg.  -Weight Loss and Dietary Counseling given   DVT prophylaxis: SCD's Start: 12/10/22 1731    Code Status: Full Code Family Communication: Family present at bedside  Disposition Plan:  Level of care: Med-Surg Status is: Inpatient Remains inpatient appropriate because: Has an unsafe discharge disposition and TOC assisting with discharge disposition given lack of housing    Consultants:  Psychiatry Orthopedic  surgery Podiatry  Procedures:  As delineated as above  Antimicrobials:  Anti-infectives (From admission, onward)    Start     Dose/Rate Route Frequency Ordered Stop   03/19/23 0900  amoxicillin-clavulanate (AUGMENTIN) 875-125 MG per tablet 1 tablet        1 tablet Oral Every 12 hours 03/19/23 0809 04/01/23 2118   03/18/23 2000  Ampicillin-Sulbactam (UNASYN) 3 g in sodium chloride 0.9 % 100 mL IVPB  Status:  Discontinued        3 g 200 mL/hr over 30 Minutes Intravenous Every 6 hours 03/18/23 1939 03/19/23 0809   03/18/23 1645  Ampicillin-Sulbactam (UNASYN) 3 g in sodium chloride 0.9 % 100 mL IVPB  Status:  Discontinued        3 g 200 mL/hr over 30 Minutes Intravenous Every 6 hours 03/18/23 1555 03/18/23 1939   12/26/22 2200  doxycycline (VIBRA-TABS) tablet 100 mg        100 mg Oral Every 12 hours 12/26/22 1307 01/08/23 2126   12/24/22 1000  doxycycline (VIBRA-TABS) tablet 100 mg  Status:  Discontinued        100 mg Oral Every 12 hours 12/24/22 0715 12/26/22 1307   12/10/22 1830  ceFAZolin (ANCEF) IVPB 2g/100 mL premix  Status:  Discontinued        2 g 200 mL/hr over 30 Minutes Intravenous Every 8 hours 12/10/22 1731 12/10/22 1740   12/10/22 1330  ceFAZolin (ANCEF) IVPB 2g/100 mL premix        2 g 200 mL/hr over 30 Minutes Intravenous On call to O.R. 12/10/22 0959 12/10/22 1534   12/08/22 0530  vancomycin (VANCOREADY) IVPB 1500 mg/300 mL        1,500 mg 150 mL/hr over 120 Minutes Intravenous Every 12 hours 12/07/22 1741 12/11/22 0955   12/08/22 0345  metroNIDAZOLE (FLAGYL) IVPB 500 mg        500 mg 100 mL/hr over 60 Minutes Intravenous Every 12 hours 12/07/22 1742 12/12/22 0344   12/07/22 2330  ceFEPIme (MAXIPIME) 2 g in sodium chloride 0.9 % 100 mL IVPB        2 g 200 mL/hr over 30 Minutes Intravenous Every 8 hours 12/07/22 1741 12/11/22 1529   12/07/22 2200  metroNIDAZOLE (FLAGYL) IVPB 500 mg  Status:  Discontinued        500 mg 100 mL/hr over 60 Minutes Intravenous Every 12  hours 12/07/22 2048 12/07/22 2056   12/07/22 1530  ceFEPIme (MAXIPIME) 2 g in sodium chloride 0.9 % 100 mL IVPB        2 g 200 mL/hr over 30 Minutes Intravenous  Once 12/07/22 1516 12/07/22 1734   12/07/22 1530  vancomycin (VANCOREADY) IVPB 1500 mg/300 mL        1,500 mg 150 mL/hr over 120 Minutes Intravenous  Once 12/07/22 1516 12/09/22 0333   12/07/22 1515  metroNIDAZOLE (FLAGYL) IVPB 500 mg        500 mg 100 mL/hr over 60 Minutes Intravenous  Once 12/07/22 1506 12/07/22 1734   12/07/22 1500  ceFAZolin (ANCEF) IVPB 1 g/50 mL premix  Status:  Discontinued        1 g 100 mL/hr over 30 Minutes Intravenous  Once 12/07/22 1452 12/07/22 1513       Subjective: Seen and examined at bedside he is sitting in his wheelchair and had no acute issues overnight.  Denies any nausea or vomiting.  Feels okay.  No other concerns or complaints this time.  Objective: Vitals:   05/14/23 1241 05/14/23 1942 05/15/23 0620 05/15/23 1301  BP: (!) 170/90 (!) 140/88 134/79 134/84  Pulse: 71 83 62 62  Resp: 18 15 15 18   Temp: 97.9 F (36.6 C) 98.4 F (36.9 C) 98.3 F (36.8 C) 98.4 F (36.9 C)  TempSrc: Oral   Oral  SpO2: 98% 96% 94% 97%  Weight:      Height:        Intake/Output Summary (Last 24 hours) at 05/15/2023 1333 Last data filed at 05/15/2023 1100 Gross per 24 hour  Intake 540 ml  Output --  Net 540 ml   Filed Weights   05/09/23 0500 05/11/23 0428 05/12/23 0633  Weight: 90.7 kg 90 kg 90 kg   Examination: Physical Exam:  Constitutional: WN/WD ill-appearing African-American male in no acute distress appears calm sitting in his wheelchair Respiratory: Diminished to auscultation bilaterally, no wheezing, rales, rhonchi or crackles. Normal respiratory effort and patient is not tachypenic. No accessory muscle use.  Unlabored breathing Cardiovascular: RRR, no murmurs / rubs / gallops. S1 and S2 auscultated.  Wearing stump shrinker Abdomen: Soft, non-tender, distended secondary to body  habitus. Bowel sounds positive.  GU: Deferred. Musculoskeletal: No clubbing / cyanosis of digits/nails.  Has a right AKA and a left AKA Skin: No  rashes, lesions, ulcers on a limited skin evaluation. No induration; Warm and dry.  Neurologic: CN 2-12 grossly intact with no focal deficits.  Romberg sign and cerebellar reflexes not assessed.  Psychiatric: Normal judgment and insight. Alert and oriented x 3. Normal mood and appropriate affect.   Data Reviewed: I have personally reviewed following labs and imaging studies  CBC: No results for input(s): "WBC", "NEUTROABS", "HGB", "HCT", "MCV", "PLT" in the last 168 hours. Basic Metabolic Panel: No results for input(s): "NA", "K", "CL", "CO2", "GLUCOSE", "BUN", "CREATININE", "CALCIUM", "MG", "PHOS" in the last 168 hours. GFR: Estimated Creatinine Clearance: 108.9 mL/min (by C-G formula based on SCr of 0.85 mg/dL). Liver Function Tests: No results for input(s): "AST", "ALT", "ALKPHOS", "BILITOT", "PROT", "ALBUMIN" in the last 168 hours. No results for input(s): "LIPASE", "AMYLASE" in the last 168 hours. No results for input(s): "AMMONIA" in the last 168 hours. Coagulation Profile: No results for input(s): "INR", "PROTIME" in the last 168 hours. Cardiac Enzymes: No results for input(s): "CKTOTAL", "CKMB", "CKMBINDEX", "TROPONINI" in the last 168 hours. BNP (last 3 results) No results for input(s): "PROBNP" in the last 8760 hours. HbA1C: No results for input(s): "HGBA1C" in the last 72 hours. CBG: No results for input(s): "GLUCAP" in the last 168 hours. Lipid Profile: No results for input(s): "CHOL", "HDL", "LDLCALC", "TRIG", "CHOLHDL", "LDLDIRECT" in the last 72 hours. Thyroid Function Tests: No results for input(s): "TSH", "T4TOTAL", "FREET4", "T3FREE", "THYROIDAB" in the last 72 hours. Anemia Panel: No results for input(s): "VITAMINB12", "FOLATE", "FERRITIN", "TIBC", "IRON", "RETICCTPCT" in the last 72 hours. Sepsis Labs: No results for  input(s): "PROCALCITON", "LATICACIDVEN" in the last 168 hours.  No results found for this or any previous visit (from the past 240 hour(s)).   Radiology Studies: No results found.  Scheduled Meds:  amLODipine  10 mg Oral Daily   ARIPiprazole  5 mg Oral Daily   vitamin C  1,000 mg Oral Daily   carvedilol  6.25 mg Oral BID WC   chlorhexidine  15 mL Mouth/Throat TID   escitalopram  5 mg Oral Daily   feeding supplement  237 mL Oral BID BM   furosemide  20 mg Oral Daily   gabapentin  900 mg Oral TID   hydrALAZINE  25 mg Oral Q8H   isosorbide mononitrate  15 mg Oral Daily   lidocaine  1 patch Transdermal Q24H   melatonin  3 mg Oral QHS   morphine  30 mg Oral Q12H   multivitamin with minerals  1 tablet Oral Daily   nystatin  5 mL Oral QID   Continuous Infusions:   LOS: 159 days   Marguerita Merles, DO [facility] Available via Epic secure chat 7am-7pm After these hours, please refer to coverage provider listed on [email or web address] 05/15/2023, 1:33 PM

## 2023-05-15 NOTE — Progress Notes (Signed)
Mobility Specialist Progress Note   05/15/23 1437  Mobility  Activity Transferred from bed to chair;Transferred from chair to bed  Level of Assistance Standby assist, set-up cues, supervision of patient - no hands on  Assistive Device Wheelchair  Range of Motion/Exercises Active  Activity Response Tolerated well   Patient received in [facility] eager to participate. Completed A&P transfer to mat table, then performed various UE and core strength exercises. Tolerated without complaint or incident. Was left in [facility] with all needs met.   [place] Aris Moman, BS EXP Mobility Specialist Please contact via SecureChat or Rehab office at [phone]

## 2023-05-16 DIAGNOSIS — Z5900 Homelessness unspecified: Secondary | ICD-10-CM | POA: Diagnosis not present

## 2023-05-16 DIAGNOSIS — L0390 Cellulitis, unspecified: Secondary | ICD-10-CM | POA: Diagnosis not present

## 2023-05-16 DIAGNOSIS — L03116 Cellulitis of left lower limb: Secondary | ICD-10-CM | POA: Diagnosis not present

## 2023-05-16 DIAGNOSIS — I96 Gangrene, not elsewhere classified: Secondary | ICD-10-CM | POA: Diagnosis not present

## 2023-05-16 NOTE — Progress Notes (Signed)
PROGRESS NOTE    [name]  BJY:[id number] DOB: [birth year] DOA: 12/07/2022 PCP: [name], NP   Brief Narrative:  [name] is a 57 y.o. male with a history of heart failure, CAD, right BKA, COPD, hyperlipidemia, hyperlipidemia, polyneuropathy, schizoaffective/bipolar disorder, was admitted to the hospital with left lower extremity cellulitis and gangrene.   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      During hospitalization, patient was also noted to have maxillary abscess and periapical abscess and underwent oral surgery with I&D and of anterior maxillary abscess and surgical extraction of multiple teeth in alveoloplasty. Currently stable.   Hospital stay prolonged due to difficulty in placement due to homelessness status status post left BKA and right AKA.   [facility],  have offered to provide a prosthesis for LLE (the BKA)   The patient scheduled an appointment Monday (June 3rd, 2024) at noon to meet up with patient and the Prosthetist to cast the L residual limb and make a plan and patient to receive a prosthesis for his left leg pro bono.  **Patient met with the Prosthetist for his Left Leg who brought initial test socket and will bring another test socket this week (05/18/23)and the patient is to wear the shrinker at all times except when bathing and can wear the shrinker with 2 layers to above his knee or with 1 layer above the knee in 3 to 4 layers more distally to apply more pressure for distal limb shaping.  Per the physical therapist he is to wear the liner during waking hours with initially starting 2 to 3 hours aware and then adding half an hour of wear each day until the patient gets used to wearing the liner.  Still has a difficult disposition given housing issue but CSW is involved.    Assessment and Plan:  Sepsis secondary to cellulitis/gangrene of left foot s/p Left BKA -Initially received  vancomycin, Cefepime and Flagyl.  -Patient underwent 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   -Previous history of right below-knee amputation.    -Patient is wheelchair-bound.  04/27/2023: Stable.  Patient is awaiting disposition. -Patient had furosemide for stump edema and electrolytes were checked and potassium was low normal so we will replete -Discussed with physical therapy and patient is to get a prosthesis for his left side pro bono next week and Prosthetist consult has been placed; Plan is to meet with Prosthetist on Monday June 3rd at noon to cast for LLE mold. -PT assisting patient in donning LLE Shrinker and demonstrated how to don shrinker on RLE    Anterior Maxillary Abscess -Status post oral surgery with I&D of anterior maxillary abscess, surgical extraction of teeth 4,5,6,11,12,13,20,21,22,27,28,29. Simple extraction teeth #7,8,9,10,23,24,25,26, Alveoloplasty x 4 quadrants on 03/25/23 Dr [name].   -Patient has completed Augmentin x 7 days.     -Currently on regular fluid consistency diet.  Continue oral hygiene and continue with benzocaine mucosal gel for the mouth and throat 4 times daily as needed for mild pain   Chronic Combined Systolic and Diastolic Heart failure -Compensated.   -Patient on Carvedilol 6.25 g p.o. twice daily, Amlodipine 10 mg p.o. daily, Furosemide 20 mg p.o. daily, and Isosorbide mononitrate 50 mg p.o. daily along with Hydralazine 25 mg every 8 -2D echocardiogram from 08/2022 with LV ejection fraction of 40% with global hypokinesis.  -Strict I's and O's and daily weights  Intake/Output Summary (Last 24 hours) at 05/16/2023 1430 Last data filed  at 05/15/2023 2041 Gross per 24 hour  Intake 240 ml  Output --  Net 240 ml  -Continue Diuresis with furosemide 20 mg po daily and continue monitor for signs and symptoms of volume overload  Essential Hypertension -on Carvedilol 6.25 g p.o. twice daily, Amlodipine 10 mg p.o. daily, Furosemide 20 mg p.o. daily, and  Isosorbide Mononitrate 50 mg p.o. daily along with Hydralazine 25 mg every 8 -Continue to monitor blood pressures per protocol -Last BP reading was elevated at 142/87   COPD -Compensated. Continue Xopenex 0.63 mg nebs every 6 as needed  -Continue to monitor respiratory status carefully SpO2: 98 % O2 Flow Rate (L/min): 0 L/min   Anxiety -No longer appears to be on Atarax but if necessary will reorder   GERD/GI prophylaxis -Does not want to take Pantoprazole 40 mg po Daily so Discontinued     Constipation -Continue Bowel Regimen with MiraLAX , Dulcolax PRN.  On narcotics   Chronic pain -On MS Contin 30 mg every 12 scheduled and has oxycodone 10 mg p.o. every 4 as needed for severe pain for 7-10 as well as tramadol 50 mg p.o. every 12 as needed for moderate pain and also on gabapentin 900 mg p.o. 3 times daily -To be supervised for concerns of previous hoarding medication. -Continue with bowel regimen with bisacodyl 5 mg p.o. daily as needed for moderate constipation, and MiraLAX 17 g p.o. daily as needed along with magnesium citrate 1 bottle once as needed for severe constipation  Shoulder Pain, improving  -Patient having shoulder discomfort so will add Lidocaine Patch and Camphor Topical   Schizoaffective Disorder -Seen by Psychiatry during hospitalization. -Continue Aripiprazole 5 mg p.o. daily and Escitalopram 5 mg p.o. Daily.    Obesity -Complicates overall prognosis and care -Estimated body mass index is 29.3 kg/m as calculated from the following:   Height as of this encounter: 5\' 9"  (1.753 m).   Weight as of this encounter: 90 kg.  -Weight Loss and Dietary Counseling given   DVT prophylaxis: SCD's Start: 12/10/22 1731    Code Status: Full Code Family Communication: Discussed with family present at bedside  Disposition Plan:  Level of care: Med-Surg Status is: Inpatient Remains inpatient appropriate because: Has an unsafe discharge disposition and TOC assisting with  discharge disposition given lack of housing    Consultants:  Psychiatry Orthopedic surgery Podiatry  Procedures:  As delineated as above  Antimicrobials:  Anti-infectives (From admission, onward)    Start     Dose/Rate Route Frequency Ordered Stop   03/19/23 0900  amoxicillin-clavulanate (AUGMENTIN) 875-125 MG per tablet 1 tablet        1 tablet Oral Every 12 hours 03/19/23 0809 04/01/23 2118   03/18/23 2000  Ampicillin-Sulbactam (UNASYN) 3 g in sodium chloride 0.9 % 100 mL IVPB  Status:  Discontinued        3 g 200 mL/hr over 30 Minutes Intravenous Every 6 hours 03/18/23 1939 03/19/23 0809   03/18/23 1645  Ampicillin-Sulbactam (UNASYN) 3 g in sodium chloride 0.9 % 100 mL IVPB  Status:  Discontinued        3 g 200 mL/hr over 30 Minutes Intravenous Every 6 hours 03/18/23 1555 03/18/23 1939   12/26/22 2200  doxycycline (VIBRA-TABS) tablet 100 mg        100 mg Oral Every 12 hours 12/26/22 1307 01/08/23 2126   12/24/22 1000  doxycycline (VIBRA-TABS) tablet 100 mg  Status:  Discontinued        100 mg Oral  Every 12 hours 12/24/22 0715 12/26/22 1307   12/10/22 1830  ceFAZolin (ANCEF) IVPB 2g/100 mL premix  Status:  Discontinued        2 g 200 mL/hr over 30 Minutes Intravenous Every 8 hours 12/10/22 1731 12/10/22 1740   12/10/22 1330  ceFAZolin (ANCEF) IVPB 2g/100 mL premix        2 g 200 mL/hr over 30 Minutes Intravenous On call to O.R. 12/10/22 0959 12/10/22 1534   12/08/22 0530  vancomycin (VANCOREADY) IVPB 1500 mg/300 mL        1,500 mg 150 mL/hr over 120 Minutes Intravenous Every 12 hours 12/07/22 1741 12/11/22 0955   12/08/22 0345  metroNIDAZOLE (FLAGYL) IVPB 500 mg        500 mg 100 mL/hr over 60 Minutes Intravenous Every 12 hours 12/07/22 1742 12/12/22 0344   12/07/22 2330  ceFEPIme (MAXIPIME) 2 g in sodium chloride 0.9 % 100 mL IVPB        2 g 200 mL/hr over 30 Minutes Intravenous Every 8 hours 12/07/22 1741 12/11/22 1529   12/07/22 2200  metroNIDAZOLE (FLAGYL) IVPB 500 mg   Status:  Discontinued        500 mg 100 mL/hr over 60 Minutes Intravenous Every 12 hours 12/07/22 2048 12/07/22 2056   12/07/22 1530  ceFEPIme (MAXIPIME) 2 g in sodium chloride 0.9 % 100 mL IVPB        2 g 200 mL/hr over 30 Minutes Intravenous  Once 12/07/22 1516 12/07/22 1734   12/07/22 1530  vancomycin (VANCOREADY) IVPB 1500 mg/300 mL        1,500 mg 150 mL/hr over 120 Minutes Intravenous  Once 12/07/22 1516 12/09/22 0333   12/07/22 1515  metroNIDAZOLE (FLAGYL) IVPB 500 mg        500 mg 100 mL/hr over 60 Minutes Intravenous  Once 12/07/22 1506 12/07/22 1734   12/07/22 1500  ceFAZolin (ANCEF) IVPB 1 g/50 mL premix  Status:  Discontinued        1 g 100 mL/hr over 30 Minutes Intravenous  Once 12/07/22 1452 12/07/22 1513       Subjective: Seen and examined at bedside and he is sitting in the wheelchair again and had no acute issues overnight he states that he walks the basketball game.  Denies any concerns or complaints.  Objective: Vitals:   05/15/23 0620 05/15/23 1301 05/15/23 1943 05/16/23 0635  BP: 134/79 134/84 110/77 (!) 142/87  Pulse: 62 62 77 (!) 59  Resp: 15 18 16 20   Temp: 98.3 F (36.8 C) 98.4 F (36.9 C) 98.3 F (36.8 C) 98 F (36.7 C)  TempSrc:  Oral  Oral  SpO2: 94% 97% 96% 98%  Weight:      Height:        Intake/Output Summary (Last 24 hours) at 05/16/2023 1431 Last data filed at 05/15/2023 2041 Gross per 24 hour  Intake 240 ml  Output --  Net 240 ml   Filed Weights   05/09/23 0500 05/11/23 0428 05/12/23 4098  Weight: 90.7 kg 90 kg 90 kg   Examination: Physical Exam:  Constitutional: WN/WD ill-appearing African-American male in no acute distress appears calm sitting in his wheelchair Respiratory: Diminished to auscultation bilaterally, no wheezing, rales, rhonchi or crackles. Normal respiratory effort and patient is not tachypenic. No accessory muscle use.  Unlabored breathing Cardiovascular: RRR, no murmurs / rubs / gallops. S1 and S2 auscultated.   Wearing his stump shrinker Abdomen: Soft, non-tender, distended secondary to body habitus. Bowel sounds positive.  GU: Deferred. Musculoskeletal: No clubbing / cyanosis of digits/nails.  Has a right AKA and a left BKA Skin: No rashes, lesions, ulcers on limited skin evaluation. No induration; Warm and dry.  Neurologic: CN 2-12 grossly intact with no focal deficits. Romberg sign and cerebellar reflexes not assessed.  Psychiatric: Normal judgment and insight. Alert and oriented x 3. Normal mood and appropriate affect.   Data Reviewed: I have personally reviewed following labs and imaging studies  CBC: No results for input(s): "WBC", "NEUTROABS", "HGB", "HCT", "MCV", "PLT" in the last 168 hours. Basic Metabolic Panel: No results for input(s): "NA", "K", "CL", "CO2", "GLUCOSE", "BUN", "CREATININE", "CALCIUM", "MG", "PHOS" in the last 168 hours. GFR: Estimated Creatinine Clearance: 108.9 mL/min (by C-G formula based on SCr of 0.85 mg/dL). Liver Function Tests: No results for input(s): "AST", "ALT", "ALKPHOS", "BILITOT", "PROT", "ALBUMIN" in the last 168 hours. No results for input(s): "LIPASE", "AMYLASE" in the last 168 hours. No results for input(s): "AMMONIA" in the last 168 hours. Coagulation Profile: No results for input(s): "INR", "PROTIME" in the last 168 hours. Cardiac Enzymes: No results for input(s): "CKTOTAL", "CKMB", "CKMBINDEX", "TROPONINI" in the last 168 hours. BNP (last 3 results) No results for input(s): "PROBNP" in the last 8760 hours. HbA1C: No results for input(s): "HGBA1C" in the last 72 hours. CBG: No results for input(s): "GLUCAP" in the last 168 hours. Lipid Profile: No results for input(s): "CHOL", "HDL", "LDLCALC", "TRIG", "CHOLHDL", "LDLDIRECT" in the last 72 hours. Thyroid Function Tests: No results for input(s): "TSH", "T4TOTAL", "FREET4", "T3FREE", "THYROIDAB" in the last 72 hours. Anemia Panel: No results for input(s): "VITAMINB12", "FOLATE", "FERRITIN",  "TIBC", "IRON", "RETICCTPCT" in the last 72 hours. Sepsis Labs: No results for input(s): "PROCALCITON", "LATICACIDVEN" in the last 168 hours.  No results found for this or any previous visit (from the past 240 hour(s)).   Radiology Studies: No results found.  Scheduled Meds:  amLODipine  10 mg Oral Daily   ARIPiprazole  5 mg Oral Daily   vitamin C  1,000 mg Oral Daily   carvedilol  6.25 mg Oral BID WC   chlorhexidine  15 mL Mouth/Throat TID   escitalopram  5 mg Oral Daily   feeding supplement  237 mL Oral BID BM   furosemide  20 mg Oral Daily   gabapentin  900 mg Oral TID   hydrALAZINE  25 mg Oral Q8H   isosorbide mononitrate  15 mg Oral Daily   lidocaine  1 patch Transdermal Q24H   melatonin  3 mg Oral QHS   morphine  30 mg Oral Q12H   multivitamin with minerals  1 tablet Oral Daily   nystatin  5 mL Oral QID   Continuous Infusions:   LOS: 160 days   Marguerita Merles, DO [facility] Available via Epic secure chat 7am-7pm After these hours, please refer to coverage provider listed on [email or web address] 05/16/2023, 2:31 PM

## 2023-05-17 ENCOUNTER — Inpatient Hospital Stay (HOSPITAL_COMMUNITY): Payer: Medicaid Other

## 2023-05-17 DIAGNOSIS — Z5900 Homelessness unspecified: Secondary | ICD-10-CM | POA: Diagnosis not present

## 2023-05-17 DIAGNOSIS — L03116 Cellulitis of left lower limb: Secondary | ICD-10-CM | POA: Diagnosis not present

## 2023-05-17 DIAGNOSIS — I96 Gangrene, not elsewhere classified: Secondary | ICD-10-CM | POA: Diagnosis not present

## 2023-05-17 DIAGNOSIS — L0390 Cellulitis, unspecified: Secondary | ICD-10-CM | POA: Diagnosis not present

## 2023-05-17 DIAGNOSIS — H6121 Impacted cerumen, right ear: Secondary | ICD-10-CM

## 2023-05-17 MED ORDER — CARBAMIDE PEROXIDE 6.5 % OT SOLN
5.0000 [drp] | Freq: Two times a day (BID) | OTIC | Status: DC
  Administered 2023-05-17 – 2023-05-27 (×11): 5 [drp] via OTIC
  Filled 2023-05-17: qty 15

## 2023-05-17 NOTE — Progress Notes (Signed)
PROGRESS NOTE    [name]  WUJ:[id number] DOB: 1966-01-21 DOA: 12/07/2022 PCP: [name], NP   Brief Narrative:  [name] is a 57 y.o. male with a history of heart failure, CAD, right BKA, COPD, hyperlipidemia, hyperlipidemia, polyneuropathy, schizoaffective/bipolar disorder, was admitted to the hospital with left lower extremity cellulitis and gangrene.   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      During hospitalization, patient was also noted to have maxillary abscess and periapical abscess and underwent oral surgery with I&D and of anterior maxillary abscess and surgical extraction of multiple teeth in alveoloplasty. Currently stable.   Hospital stay prolonged due to difficulty in placement due to homelessness status status post left BKA and right AKA.   [facility],  have offered to provide a prosthesis for LLE (the BKA)   The patient scheduled an appointment Monday (June 3rd, 2024) at noon to meet up with patient and the Prosthetist to cast the L residual limb and make a plan and patient to receive a prosthesis for his left leg pro bono.  **Patient met with the Prosthetist for his Left Leg who brought initial test socket and will bring another test socket this week (05/18/23)and the patient is to wear the shrinker at all times except when bathing and can wear the shrinker with 2 layers to above his knee or with 1 layer above the knee in 3 to 4 layers more distally to apply more pressure for distal limb shaping.  Per the physical therapist he is to wear the liner during waking hours with initially starting 2 to 3 hours aware and then adding half an hour of wear each day until the patient gets used to wearing the liner.  Still has a difficult disposition given housing issue but CSW is involved.   Assessment and Plan:  Sepsis secondary to cellulitis/gangrene of left foot s/p Left BKA -Initially received  vancomycin, Cefepime and Flagyl.  -Patient underwent 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   -Previous history of right below-knee amputation.    -Patient is wheelchair-bound.  04/27/2023: Stable.  Patient is awaiting disposition. -Patient had furosemide for stump edema and electrolytes were checked and potassium was low normal so we will replete -Discussed with physical therapy and patient is to get a prosthesis for his left side pro bono next week and Prosthetist consult has been placed; Plan is to meet with Prosthetist on Monday June 3rd at noon to cast for LLE mold. -PT assisting patient in donning LLE Shrinker and getting worked up for a prosthesis.  PT evaluated and recommending possible prosthesis for her right leg and have recommended getting an x-ray to evaluate his anatomy -Right leg x-ray done which showed "Interval right lower extremity amputation at the knee joint. Hip joint alignment is maintained without dislocation. Moderate degenerative changes of the right hip joint. No erosion or periosteal elevation. Soft tissue thickening at the distal stump. No soft tissue gas."   Anterior Maxillary Abscess -Status post oral surgery with I&D of anterior maxillary abscess, surgical extraction of teeth 4,5,6,11,12,13,20,21,22,27,28,29. Simple extraction teeth #7,8,9,10,23,24,25,26, Alveoloplasty x 4 quadrants on 03/25/23 Dr [name].   -Patient has completed Augmentin x 7 days.     -Currently on regular fluid consistency diet.  Continue oral hygiene and continue with benzocaine mucosal gel for the mouth and throat 4 times daily as needed for mild pain  Unilateral Hearing Loss in the Setting of Cerumen Impaction -Otoscopic Examination reveals  a severely impacted ear -Start Debrox -Ear Wax Removal Order Placed for Nursing     Chronic Combined Systolic and Diastolic Heart failure -Compensated.   -Patient on Carvedilol 6.25 g p.o. twice daily, Amlodipine 10 mg p.o. daily, Furosemide 20 mg p.o.  daily, and Isosorbide mononitrate 50 mg p.o. daily along with Hydralazine 25 mg every 8 -2D echocardiogram from 08/2022 with LV ejection fraction of 40% with global hypokinesis.  -Strict I's and O's and daily weights  Intake/Output Summary (Last 24 hours) at 05/17/2023 0955 Last data filed at 05/16/2023 2152 Gross per 24 hour  Intake --  Output 225 ml  Net -225 ml  -Continue Diuresis with furosemide 20 mg po daily and continue monitor for signs and symptoms of volume overload  Essential Hypertension -on Carvedilol 6.25 g p.o. twice daily, Amlodipine 10 mg p.o. daily, Furosemide 20 mg p.o. daily, and Isosorbide Mononitrate 50 mg p.o. daily along with Hydralazine 25 mg every 8 (Refused BP meds on 05/16/23) -Continue to monitor blood pressures per protocol -Last BP reading was elevated at 158/86   COPD -Compensated. Continue Xopenex 0.63 mg nebs every 6 as needed  -Continue to monitor respiratory status carefully SpO2: 100 % O2 Flow Rate (L/min): 0 L/min   Anxiety -No longer appears to be on Atarax but if necessary will reorder   GERD/GI prophylaxis -Does not want to take Pantoprazole 40 mg po Daily so Discontinued     Constipation -Continue Bowel Regimen with MiraLAX , Dulcolax PRN.  On narcotics   Chronic pain -On MS Contin 30 mg every 12 scheduled and has oxycodone 10 mg p.o. every 4 as needed for severe pain for 7-10 as well as tramadol 50 mg p.o. every 12 as needed for moderate pain and also on gabapentin 900 mg p.o. 3 times daily -To be supervised for concerns of previous hoarding medication. -Continue with bowel regimen with bisacodyl 5 mg p.o. daily as needed for moderate constipation, and MiraLAX 17 g p.o. daily as needed along with magnesium citrate 1 bottle once as needed for severe constipation  Shoulder Pain, improving  -Patient having shoulder discomfort so will add Lidocaine Patch and Camphor Topical   Schizoaffective Disorder -Seen by Psychiatry during  hospitalization. -Continue Aripiprazole 5 mg p.o. daily and Escitalopram 5 mg p.o. Daily.    Obesity -Complicates overall prognosis and care -Estimated body mass index is 29.3 kg/m as calculated from the following:   Height as of this encounter: 5\' 9"  (1.753 m).   Weight as of this encounter: 90 kg.  -Weight Loss and Dietary Counseling given   DVT prophylaxis: SCD's Start: 12/10/22 1731    Code Status: Full Code Family Communication: Discussed with family friend at bedside  Disposition Plan:  Level of care: Med-Surg Status is: Inpatient Remains inpatient appropriate because: Has an unsafe discharge disposition and TOC assisting with discharge disposition given lack of housing    Consultants:  Psychiatry Orthopedic surgery Podiatry  Procedures:  As delineated as above  Antimicrobials:  Anti-infectives (From admission, onward)    Start     Dose/Rate Route Frequency Ordered Stop   03/19/23 0900  amoxicillin-clavulanate (AUGMENTIN) 875-125 MG per tablet 1 tablet        1 tablet Oral Every 12 hours 03/19/23 0809 04/01/23 2118   03/18/23 2000  Ampicillin-Sulbactam (UNASYN) 3 g in sodium chloride 0.9 % 100 mL IVPB  Status:  Discontinued        3 g 200 mL/hr over 30 Minutes Intravenous Every 6 hours 03/18/23  1939 03/19/23 0809   03/18/23 1645  Ampicillin-Sulbactam (UNASYN) 3 g in sodium chloride 0.9 % 100 mL IVPB  Status:  Discontinued        3 g 200 mL/hr over 30 Minutes Intravenous Every 6 hours 03/18/23 1555 03/18/23 1939   12/26/22 2200  doxycycline (VIBRA-TABS) tablet 100 mg        100 mg Oral Every 12 hours 12/26/22 1307 01/08/23 2126   12/24/22 1000  doxycycline (VIBRA-TABS) tablet 100 mg  Status:  Discontinued        100 mg Oral Every 12 hours 12/24/22 0715 12/26/22 1307   12/10/22 1830  ceFAZolin (ANCEF) IVPB 2g/100 mL premix  Status:  Discontinued        2 g 200 mL/hr over 30 Minutes Intravenous Every 8 hours 12/10/22 1731 12/10/22 1740   12/10/22 1330  ceFAZolin  (ANCEF) IVPB 2g/100 mL premix        2 g 200 mL/hr over 30 Minutes Intravenous On call to O.R. 12/10/22 0959 12/10/22 1534   12/08/22 0530  vancomycin (VANCOREADY) IVPB 1500 mg/300 mL        1,500 mg 150 mL/hr over 120 Minutes Intravenous Every 12 hours 12/07/22 1741 12/11/22 0955   12/08/22 0345  metroNIDAZOLE (FLAGYL) IVPB 500 mg        500 mg 100 mL/hr over 60 Minutes Intravenous Every 12 hours 12/07/22 1742 12/12/22 0344   12/07/22 2330  ceFEPIme (MAXIPIME) 2 g in sodium chloride 0.9 % 100 mL IVPB        2 g 200 mL/hr over 30 Minutes Intravenous Every 8 hours 12/07/22 1741 12/11/22 1529   12/07/22 2200  metroNIDAZOLE (FLAGYL) IVPB 500 mg  Status:  Discontinued        500 mg 100 mL/hr over 60 Minutes Intravenous Every 12 hours 12/07/22 2048 12/07/22 2056   12/07/22 1530  ceFEPIme (MAXIPIME) 2 g in sodium chloride 0.9 % 100 mL IVPB        2 g 200 mL/hr over 30 Minutes Intravenous  Once 12/07/22 1516 12/07/22 1734   12/07/22 1530  vancomycin (VANCOREADY) IVPB 1500 mg/300 mL        1,500 mg 150 mL/hr over 120 Minutes Intravenous  Once 12/07/22 1516 12/09/22 0333   12/07/22 1515  metroNIDAZOLE (FLAGYL) IVPB 500 mg        500 mg 100 mL/hr over 60 Minutes Intravenous  Once 12/07/22 1506 12/07/22 1734   12/07/22 1500  ceFAZolin (ANCEF) IVPB 1 g/50 mL premix  Status:  Discontinued        1 g 100 mL/hr over 30 Minutes Intravenous  Once 12/07/22 1452 12/07/22 1513       Subjective: Seen and examined at bedside he was complaining about some unilateral hearing loss in his right ear.  He denies any chest pain or shortness breath.  No nausea or vomiting.  States that he came here and states that he "may have slept on it wrong".  No other concerns or plaints this time.  Objective: Vitals:   05/16/23 2151 05/17/23 0521 05/17/23 0836 05/17/23 1625  BP: (!) 149/93 (!) 173/90 (!) 158/86 98/71  Pulse: 69 64 75 74  Resp: 18 16 18    Temp: 98.5 F (36.9 C) 97.7 F (36.5 C) (!) 97.5 F (36.4 C)    TempSrc: Oral  Oral   SpO2: 100% 99% 100%   Weight:      Height:        Intake/Output Summary (Last 24 hours) at  05/17/2023 1922 Last data filed at 05/16/2023 2152 Gross per 24 hour  Intake --  Output 225 ml  Net -225 ml   Filed Weights   05/09/23 0500 05/11/23 0428 05/12/23 2956  Weight: 90.7 kg 90 kg 90 kg   Examination: Physical Exam:  Constitutional: WN/WD ill-appearing African-American male in no acute distress appears calm ENMT: External Ears appear normal but otoscopic examination shows cerumen impaction Respiratory: Diminished to auscultation bilaterally, no wheezing, rales, rhonchi or crackles. Normal respiratory effort and patient is not tachypenic. No accessory muscle use.  Unlabored breathing Cardiovascular: RRR, no murmurs / rubs / gallops. S1 and S2 auscultated. No extremity edema and not wearing his stump shrinker Abdomen: Soft, non-tender, distended secondary to body habitus. Bowel sounds positive.  GU: Deferred. Musculoskeletal: No clubbing / cyanosis of digits/nails.  Has a right AKA and the left BKA Skin: No rashes, lesions, ulcers on limited skin evaluation. No induration; Warm and dry.  Neurologic: CN 2-12 grossly intact with no focal deficits. Romberg sign and cerebellar reflexes not assessed.  Psychiatric: Normal judgment and insight. Alert and oriented x 3. Normal mood and appropriate affect.   Data Reviewed: I have personally reviewed following labs and imaging studies  CBC: No results for input(s): "WBC", "NEUTROABS", "HGB", "HCT", "MCV", "PLT" in the last 168 hours. Basic Metabolic Panel: No results for input(s): "NA", "K", "CL", "CO2", "GLUCOSE", "BUN", "CREATININE", "CALCIUM", "MG", "PHOS" in the last 168 hours. GFR: Estimated Creatinine Clearance: 108.9 mL/min (by C-G formula based on SCr of 0.85 mg/dL). Liver Function Tests: No results for input(s): "AST", "ALT", "ALKPHOS", "BILITOT", "PROT", "ALBUMIN" in the last 168 hours. No results for  input(s): "LIPASE", "AMYLASE" in the last 168 hours. No results for input(s): "AMMONIA" in the last 168 hours. Coagulation Profile: No results for input(s): "INR", "PROTIME" in the last 168 hours. Cardiac Enzymes: No results for input(s): "CKTOTAL", "CKMB", "CKMBINDEX", "TROPONINI" in the last 168 hours. BNP (last 3 results) No results for input(s): "PROBNP" in the last 8760 hours. HbA1C: No results for input(s): "HGBA1C" in the last 72 hours. CBG: No results for input(s): "GLUCAP" in the last 168 hours. Lipid Profile: No results for input(s): "CHOL", "HDL", "LDLCALC", "TRIG", "CHOLHDL", "LDLDIRECT" in the last 72 hours. Thyroid Function Tests: No results for input(s): "TSH", "T4TOTAL", "FREET4", "T3FREE", "THYROIDAB" in the last 72 hours. Anemia Panel: No results for input(s): "VITAMINB12", "FOLATE", "FERRITIN", "TIBC", "IRON", "RETICCTPCT" in the last 72 hours. Sepsis Labs: No results for input(s): "PROCALCITON", "LATICACIDVEN" in the last 168 hours.  No results found for this or any previous visit (from the past 240 hour(s)).   Radiology Studies: DG FEMUR PORT, MIN 2 VIEWS RIGHT  Result Date: 05/17/2023 CLINICAL DATA:  [id number] Pain [id number] EXAM: RIGHT FEMUR PORTABLE 2 VIEW COMPARISON:  11/16/2021 FINDINGS: Interval right lower extremity amputation at the knee joint. Hip joint alignment is maintained without dislocation. Moderate degenerative changes of the right hip joint. No erosion or periosteal elevation. Soft tissue thickening at the distal stump. No soft tissue gas. IMPRESSION: Interval right lower extremity amputation at the knee joint. Soft tissue thickening at the distal stump. No acute osseous abnormality. Electronically Signed   By: Duanne Guess D.O.   On: 05/17/2023 16:32    Scheduled Meds:  amLODipine  10 mg Oral Daily   ARIPiprazole  5 mg Oral Daily   vitamin C  1,000 mg Oral Daily   carbamide peroxide  5 drop Both EARS BID   carvedilol  6.25 mg Oral BID WC  chlorhexidine  15 mL Mouth/Throat TID   escitalopram  5 mg Oral Daily   feeding supplement  237 mL Oral BID BM   furosemide  20 mg Oral Daily   gabapentin  900 mg Oral TID   hydrALAZINE  25 mg Oral Q8H   isosorbide mononitrate  15 mg Oral Daily   lidocaine  1 patch Transdermal Q24H   melatonin  3 mg Oral QHS   morphine  30 mg Oral Q12H   multivitamin with minerals  1 tablet Oral Daily   nystatin  5 mL Oral QID   Continuous Infusions:   LOS: 161 days   Marguerita Merles, DO [facility] Available via Epic secure chat 7am-7pm After these hours, please refer to coverage provider listed on [email or web address] 05/17/2023, 7:22 PM

## 2023-05-18 DIAGNOSIS — Z5900 Homelessness unspecified: Secondary | ICD-10-CM | POA: Diagnosis not present

## 2023-05-18 DIAGNOSIS — L0390 Cellulitis, unspecified: Secondary | ICD-10-CM | POA: Diagnosis not present

## 2023-05-18 DIAGNOSIS — I96 Gangrene, not elsewhere classified: Secondary | ICD-10-CM | POA: Diagnosis not present

## 2023-05-18 DIAGNOSIS — L03116 Cellulitis of left lower limb: Secondary | ICD-10-CM | POA: Diagnosis not present

## 2023-05-18 NOTE — TOC Progression Note (Signed)
Transition of Care [facility]) - Progression Note    Patient Details  Name: [name] MRN: [id number] Date of Birth: 07-05-1966  Transition [facility]) CM/SW Contact  [name], [name] Phone Number: 05/18/2023, 8:45 AM  Clinical Narrative:    CSW spoke with patient briefly to check in regarding his discharge plan. Patient states he met with Lorayne Marek from [employer] last week to complete an application for an apartment for [employer] at [employer]. All questions answered and there are no additional concerns at this time.  CSW received e-mail from [facility] confirming the application has been submitted. Lorayne Marek is working to obtain financial documents to support the application.  CSW will continue following for discharge planning purposes.   Expected Discharge Plan: Home/Self Care Barriers to Discharge: Homeless with medical needs  Expected Discharge Plan and Services   Discharge Planning Services: CM Consult Post Acute Care Choice: Home Health Living arrangements for the past 2 months: Homeless                                       Social Determinants of Health (SDOH) Interventions SDOH Screenings   Food Insecurity: Food Insecurity Present (11/24/2022)  Housing: Medium Risk (12/07/2022)  Transportation Needs: Unmet Transportation Needs (12/07/2022)  Utilities: Not At Risk (12/07/2022)  Alcohol Screen: Medium Risk (06/24/2019)  Depression (PHQ2-9): Medium Risk (02/10/2021)  Tobacco Use: High Risk (03/26/2023)    Readmission Risk Interventions    12/09/2022    9:02 AM 12/01/2022    3:56 PM 08/18/2022    9:00 AM  Readmission Risk Prevention Plan  Transportation Screening Complete Complete Complete  Medication Review Ocean[employer]) Complete Complete Complete  PCP or Specialist appointment within 3-5 days of discharge Complete Complete Complete  HRI or Home Care Consult Complete Complete Complete  SW Recovery Care/Counseling Consult Complete Complete Complete   Palliative Care Screening Not Applicable Not Applicable Not Applicable  Skilled Nursing Facility Not Applicable Not Applicable Not Applicable  SNF Comments  n/a

## 2023-05-18 NOTE — Progress Notes (Signed)
Physical Therapy Treatment Patient Details Name: [name] MRN: [id number] DOB: [birth year] Today's Date: 05/18/2023   History of Present Illness Pt is a 57 y/o M who presented 12/17/22 with L ankle cellulitis and underwent L BKA on 1/10. Oral abscesses noted, underwent oral surgery Wed, 4/24; Initiated fabrication of prosthesis for LLE on 6/3; PMH includes asthma, bioplar 1, depression, schizoaffective disorder, CVA, chronic CHF, homelessness, HTN, DM2, cocaine and cannabis use, and R knee disarticulation    PT Comments    Continuing work on functional mobility and activity tolerance;  Session focused on assessing revised socket fit for LLE prosthesis; [name] stood x3 in parallel bars with 2 person mod assist initially, and last rep with min assist; still with heavy dependence on UE support from parallel bars, but with notable increased weight bearing through the L LE and prosthesis last standing trial; [name], Prosthetist will take the socket back to the lab for another set of revisions, and [name] will likely get his LLE prosthesis next Monday    Recommendations for follow up therapy are one component of a multi-disciplinary discharge planning process, led by the attending physician.  Recommendations may be updated based on patient status, additional functional criteria and insurance authorization.  Follow Up Recommendations  Can patient physically be transported by private vehicle: Yes (Currently recommend a sliding board for car transfers; may not need once he gets L prosthesis)    Assistance Recommended at Discharge PRN  Patient can return home with the following Assist for transportation   Equipment Recommendations  Other (comment) (tub bench, sliding board)    Recommendations for Other Services  (Recommend starting the Mental/Behavioral Health support through [name] to start while [name] is here in the hospital; [name] consult)     Precautions / Restrictions  Precautions Precautions: None Precaution Comments: R knee disrticulation, L BKA; Fall risk is present, but minimal Restrictions RLE Weight Bearing: Weight bearing as tolerated LLE Weight Bearing: Weight bearing as tolerated     Mobility  Bed Mobility                    Transfers Overall transfer level: Needs assistance (Modified aindependent with wc transfers) Equipment used:  (Parallel bars in 4 MW (AIR) [employer]) Transfers: Sit to/from Stand Sit to Stand: +2 safety/equipment, +2 physical assistance, Mod<>Min assist           General transfer comment: Stood 3x in parallel bars onto revised socket for his prosthesis; held L single limb stance for 30-45 seconds; multimodal cues for form and for fully extending L hip in standing; last rep, +2 min assist to rise; mod assist to control stand to sit    Ambulation/Gait                   Stairs             Wheelchair Mobility    Modified Rankin (Stroke Patients Only)       Balance                                            Cognition Arousal/Alertness: Awake/alert Behavior During Therapy: WFL for tasks assessed/performed Overall Cognitive Status: Within Functional Limits for tasks assessed (for simple mobility tasks)  General Comments: When [name] understands rationale for an exercise or activity, and that rationale is in line with his goals, he participates much better        Exercises      General Comments  Noted in x-ray of RLE that patella and entire femur are present; RLE amputation was a knee disarticulation; soft tissue thickening at distal RLE; Discussed adding self-soft tissue massage to distal RLE to help with pressure and weight acceptance distal RLE once [name] is a candidate for R prosthesis      Pertinent Vitals/Pain Pain Assessment Pain Assessment: Faces Faces Pain Scale: Hurts even more Pain Location: L distal  residual limb with weight bearing in initial prosthesis socket trial Pain Descriptors / Indicators: Grimacing Pain Intervention(s): Monitored during session    Home Living                          Prior Function            PT Goals (current goals can now be found in the care plan section) Acute Rehab PT Goals Patient Stated Goal: Prepare for prosthetics fitting; Would like to get housing in [facility] through the Transition to [employer] PT Goal Formulation: With patient Time For Goal Achievement: 05/19/23 Potential to Achieve Goals: Good    Frequency    Min 1X/week      PT Plan Current plan remains appropriate;Frequency needs to be updated (When prosthesis training begins, will incr frequency)    Co-evaluation              AM-PAC PT "6 Clicks" Mobility   Outcome Measure  Help needed turning from your back to your side while in a flat bed without using bedrails?: None Help needed moving from lying on your back to sitting on the side of a flat bed without using bedrails?: None Help needed moving to and from a bed to a chair (including a wheelchair)?: None Help needed standing up from a chair using your arms (e.g., wheelchair or bedside chair)?: Total Help needed to walk in hospital room?: Total Help needed climbing 3-5 steps with a railing? : Total 6 Click Score: 15    End of Session Equipment Utilized During Treatment: Other (comment);Gait belt (revised socket provided by [facility]) Activity Tolerance: Patient tolerated treatment well Patient left:  (managing independently) Nurse Communication: Mobility status PT Visit Diagnosis: Muscle weakness (generalized) (M62.81);Other abnormalities of gait and mobility (R26.89)     Time: 0981-1914 PT Time Calculation (min) (ACUTE ONLY): 26 min  Charges:  $Therapeutic Activity: 8-22 mins $Prosthetics Training: 8-18mins                     Van Clines, PT  Acute Rehabilitation  Services Office [phone] Secure Chat welcomed    [name] 05/18/2023, 2:32 PM

## 2023-05-18 NOTE — Progress Notes (Signed)
PROGRESS NOTE    [name]  ION:[id number] DOB: 1966/01/27 DOA: 12/07/2022 PCP: [name], NP   Brief Narrative:  [name] is a 57 y.o. male with a history of heart failure, CAD, right BKA, COPD, hyperlipidemia, hyperlipidemia, polyneuropathy, schizoaffective/bipolar disorder, was admitted to the hospital with left lower extremity cellulitis and gangrene.   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      During hospitalization, patient was also noted to have maxillary abscess and periapical abscess and underwent oral surgery with I&D and of anterior maxillary abscess and surgical extraction of multiple teeth in alveoloplasty. Currently stable.   Hospital stay prolonged due to difficulty in placement due to homelessness status status post left BKA and right AKA.   [facility],  have offered to provide a prosthesis for LLE (the BKA)   The patient scheduled an appointment Monday (June 3rd, 2024) at noon to meet up with patient and the Prosthetist to cast the L residual limb and make a plan and patient to receive a prosthesis for his left leg pro bono.  **Patient met with the Prosthetist for his Left Leg who brought initial test socket and will bring another test socket this week (05/18/23)and the patient is to wear the shrinker at all times except when bathing and can wear the shrinker with 2 layers to above his knee or with 1 layer above the knee in 3 to 4 layers more distally to apply more pressure for distal limb shaping.  Per the physical therapist he is to wear the liner during waking hours with initially starting 2 to 3 hours aware and then adding half an hour of wear each day until the patient gets used to wearing the liner.  Still has a difficult disposition given housing issue but CSW is involved.   Assessment and Plan:  Sepsis secondary to cellulitis/gangrene of left foot s/p Left BKA -Initially received  vancomycin, Cefepime and Flagyl.  -Patient underwent 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   -Previous history of right below-knee amputation.    -Patient is wheelchair-bound.  04/27/2023: Stable.  Patient is awaiting disposition. -Patient had furosemide for stump edema and electrolytes were checked and potassium was low normal so we will replete -Discussed with physical therapy and patient is to get a prosthesis for his left side pro bono next week and Prosthetist consult has been placed; Plan is to meet with Prosthetist on Monday June 3rd at noon to cast for LLE mold. -PT assisting patient in donning LLE Shrinker and getting worked up for a prosthesis.  PT evaluated and recommending possible prosthesis for her right leg and have recommended getting an x-ray to evaluate his anatomy -Right leg x-ray done which showed "Interval right lower extremity amputation at the knee joint. Hip joint alignment is maintained without dislocation. Moderate degenerative changes of the right hip joint. No erosion or periosteal elevation. Soft tissue thickening at the distal stump. No soft tissue gas."   Anterior Maxillary Abscess -Status post oral surgery with I&D of anterior maxillary abscess, surgical extraction of teeth 4,5,6,11,12,13,20,21,22,27,28,29. Simple extraction teeth #7,8,9,10,23,24,25,26, Alveoloplasty x 4 quadrants on 03/25/23 Dr [name].   -Patient has completed Augmentin x 7 days.     -Currently on regular fluid consistency diet.  Continue oral hygiene and continue with benzocaine mucosal gel for the mouth and throat 4 times daily as needed for mild pain  Unilateral Hearing Loss in the Setting of Cerumen Impaction -Otoscopic Examination reveals  a severely impacted ear -Start Debrox -Ear Wax Removal Order Placed for Nursing     Chronic Combined Systolic and Diastolic Heart failure -Compensated.   -Patient on Carvedilol 6.25 g p.o. twice daily, Amlodipine 10 mg p.o. daily, Furosemide 20 mg p.o.  daily, and Isosorbide mononitrate 50 mg p.o. daily along with Hydralazine 25 mg every 8 -2D echocardiogram from 08/2022 with LV ejection fraction of 40% with global hypokinesis.  -Strict I's and O's and daily weights  Intake/Output Summary (Last 24 hours) at 05/17/2023 0955 Last data filed at 05/16/2023 2152 Gross per 24 hour  Intake --  Output 225 ml  Net -225 ml  -Continue Diuresis with furosemide 20 mg po daily and continue monitor for signs and symptoms of volume overload  Essential Hypertension -on Carvedilol 6.25 g p.o. twice daily, Amlodipine 10 mg p.o. daily, Furosemide 20 mg p.o. daily, and Isosorbide Mononitrate 50 mg p.o. daily along with Hydralazine 25 mg every 8 (Refused BP meds on 05/16/23) -Continue to monitor blood pressures per protocol -Last BP reading was elevated at 158/86   COPD -Compensated. Continue Xopenex 0.63 mg nebs every 6 as needed  -Continue to monitor respiratory status carefully SpO2: 100 % O2 Flow Rate (L/min): 0 L/min   Anxiety -No longer appears to be on Atarax but if necessary will reorder   GERD/GI prophylaxis -Does not want to take Pantoprazole 40 mg po Daily so Discontinued     Constipation -Continue Bowel Regimen with MiraLAX , Dulcolax PRN.  On narcotics   Chronic pain -On MS Contin 30 mg every 12 scheduled and has oxycodone 10 mg p.o. every 4 as needed for severe pain for 7-10 as well as tramadol 50 mg p.o. every 12 as needed for moderate pain and also on gabapentin 900 mg p.o. 3 times daily -To be supervised for concerns of previous hoarding medication. -Continue with bowel regimen with bisacodyl 5 mg p.o. daily as needed for moderate constipation, and MiraLAX 17 g p.o. daily as needed along with magnesium citrate 1 bottle once as needed for severe constipation  Shoulder Pain, improving  -Patient having shoulder discomfort so will add Lidocaine Patch and Camphor Topical   Schizoaffective Disorder -Seen by Psychiatry during  hospitalization. -Continue Aripiprazole 5 mg p.o. daily and Escitalopram 5 mg p.o. Daily.    Obesity -Complicates overall prognosis and care -Estimated body mass index is 29.3 kg/m as calculated from the following:   Height as of this encounter: 5\' 9"  (1.753 m).   Weight as of this encounter: 90 kg.  -Weight Loss and Dietary Counseling given   DVT prophylaxis: SCD's Start: 12/10/22 1731    Code Status: Full Code Family Communication: No family currently at bedside  Disposition Plan:  Level of care: Med-Surg Status is: Inpatient Remains inpatient appropriate because: Has an unsafe discharge disposition and TOC assisting with discharge disposition given lack of housing    Consultants:  Psychiatry Orthopedic surgery Podiatry  Procedures:  As delineated as above  Antimicrobials:  Anti-infectives (From admission, onward)    Start     Dose/Rate Route Frequency Ordered Stop   03/19/23 0900  amoxicillin-clavulanate (AUGMENTIN) 875-125 MG per tablet 1 tablet        1 tablet Oral Every 12 hours 03/19/23 0809 04/01/23 2118   03/18/23 2000  Ampicillin-Sulbactam (UNASYN) 3 g in sodium chloride 0.9 % 100 mL IVPB  Status:  Discontinued        3 g 200 mL/hr over 30 Minutes Intravenous Every 6 hours 03/18/23 1939  03/19/23 0809   03/18/23 1645  Ampicillin-Sulbactam (UNASYN) 3 g in sodium chloride 0.9 % 100 mL IVPB  Status:  Discontinued        3 g 200 mL/hr over 30 Minutes Intravenous Every 6 hours 03/18/23 1555 03/18/23 1939   12/26/22 2200  doxycycline (VIBRA-TABS) tablet 100 mg        100 mg Oral Every 12 hours 12/26/22 1307 01/08/23 2126   12/24/22 1000  doxycycline (VIBRA-TABS) tablet 100 mg  Status:  Discontinued        100 mg Oral Every 12 hours 12/24/22 0715 12/26/22 1307   12/10/22 1830  ceFAZolin (ANCEF) IVPB 2g/100 mL premix  Status:  Discontinued        2 g 200 mL/hr over 30 Minutes Intravenous Every 8 hours 12/10/22 1731 12/10/22 1740   12/10/22 1330  ceFAZolin (ANCEF) IVPB  2g/100 mL premix        2 g 200 mL/hr over 30 Minutes Intravenous On call to O.R. 12/10/22 0959 12/10/22 1534   12/08/22 0530  vancomycin (VANCOREADY) IVPB 1500 mg/300 mL        1,500 mg 150 mL/hr over 120 Minutes Intravenous Every 12 hours 12/07/22 1741 12/11/22 0955   12/08/22 0345  metroNIDAZOLE (FLAGYL) IVPB 500 mg        500 mg 100 mL/hr over 60 Minutes Intravenous Every 12 hours 12/07/22 1742 12/12/22 0344   12/07/22 2330  ceFEPIme (MAXIPIME) 2 g in sodium chloride 0.9 % 100 mL IVPB        2 g 200 mL/hr over 30 Minutes Intravenous Every 8 hours 12/07/22 1741 12/11/22 1529   12/07/22 2200  metroNIDAZOLE (FLAGYL) IVPB 500 mg  Status:  Discontinued        500 mg 100 mL/hr over 60 Minutes Intravenous Every 12 hours 12/07/22 2048 12/07/22 2056   12/07/22 1530  ceFEPIme (MAXIPIME) 2 g in sodium chloride 0.9 % 100 mL IVPB        2 g 200 mL/hr over 30 Minutes Intravenous  Once 12/07/22 1516 12/07/22 1734   12/07/22 1530  vancomycin (VANCOREADY) IVPB 1500 mg/300 mL        1,500 mg 150 mL/hr over 120 Minutes Intravenous  Once 12/07/22 1516 12/09/22 0333   12/07/22 1515  metroNIDAZOLE (FLAGYL) IVPB 500 mg        500 mg 100 mL/hr over 60 Minutes Intravenous  Once 12/07/22 1506 12/07/22 1734   12/07/22 1500  ceFAZolin (ANCEF) IVPB 1 g/50 mL premix  Status:  Discontinued        1 g 100 mL/hr over 30 Minutes Intravenous  Once 12/07/22 1452 12/07/22 1513       Subjective: Seen and examined at bedside and he continues to not hear out of his right ear and states that he just got the Debrox drops and has not had the nurse flush this area.  No nausea or vomiting.  Feels okay.  Was a little sleepy but states that he denies any other complaints at this time.  Objective: Vitals:   05/17/23 1933 05/18/23 0720 05/18/23 0810 05/18/23 1505  BP: 97/68 (!) 146/69  133/84  Pulse: 65 (!) 58 64 68  Resp: 18 18  17   Temp: 97.9 F (36.6 C) 97.9 F (36.6 C)    TempSrc: Oral Oral    SpO2: 98% 96% 100%  100%  Weight:      Height:        Intake/Output Summary (Last 24 hours) at 05/18/2023 1617 Last  data filed at 05/18/2023 0900 Gross per 24 hour  Intake 240 ml  Output --  Net 240 ml   Filed Weights   05/09/23 0500 05/11/23 0428 05/12/23 9147  Weight: 90.7 kg 90 kg 90 kg    Examination: Physical Exam:  Constitutional: WN/WD ill-appearing African-American male in no acute distress appears calm and awoken from his sleep Respiratory: Diminished to auscultation bilaterally with coarse breath sounds, no wheezing, rales, rhonchi or crackles. Normal respiratory effort and patient is not tachypenic. No accessory muscle use.  Unlabored breathing Cardiovascular: RRR, no murmurs / rubs / gallops. S1 and S2 auscultated.  Abdomen: Soft, non-tender, distended secondary to body habitus. Bowel sounds positive.  GU: Deferred. Musculoskeletal: No clubbing / cyanosis of digits/nails.  Has a right AKA and a left BKA Skin: No rashes, lesions, ulcers on a limited skin evaluation. No induration; Warm and dry.  Neurologic: CN 2-12 grossly intact with no focal deficits.  Romberg sign and cerebellar reflexes not assessed.  Psychiatric: Normal judgment and insight. Alert and oriented x 3. Normal mood and appropriate affect.   Data Reviewed: I have personally reviewed following labs and imaging studies  CBC: No results for input(s): "WBC", "NEUTROABS", "HGB", "HCT", "MCV", "PLT" in the last 168 hours. Basic Metabolic Panel: No results for input(s): "NA", "K", "CL", "CO2", "GLUCOSE", "BUN", "CREATININE", "CALCIUM", "MG", "PHOS" in the last 168 hours. GFR: Estimated Creatinine Clearance: 108.9 mL/min (by C-G formula based on SCr of 0.85 mg/dL). Liver Function Tests: No results for input(s): "AST", "ALT", "ALKPHOS", "BILITOT", "PROT", "ALBUMIN" in the last 168 hours. No results for input(s): "LIPASE", "AMYLASE" in the last 168 hours. No results for input(s): "AMMONIA" in the last 168 hours. Coagulation  Profile: No results for input(s): "INR", "PROTIME" in the last 168 hours. Cardiac Enzymes: No results for input(s): "CKTOTAL", "CKMB", "CKMBINDEX", "TROPONINI" in the last 168 hours. BNP (last 3 results) No results for input(s): "PROBNP" in the last 8760 hours. HbA1C: No results for input(s): "HGBA1C" in the last 72 hours. CBG: No results for input(s): "GLUCAP" in the last 168 hours. Lipid Profile: No results for input(s): "CHOL", "HDL", "LDLCALC", "TRIG", "CHOLHDL", "LDLDIRECT" in the last 72 hours. Thyroid Function Tests: No results for input(s): "TSH", "T4TOTAL", "FREET4", "T3FREE", "THYROIDAB" in the last 72 hours. Anemia Panel: No results for input(s): "VITAMINB12", "FOLATE", "FERRITIN", "TIBC", "IRON", "RETICCTPCT" in the last 72 hours. Sepsis Labs: No results for input(s): "PROCALCITON", "LATICACIDVEN" in the last 168 hours.  No results found for this or any previous visit (from the past 240 hour(s)).   Radiology Studies: DG FEMUR PORT, MIN 2 VIEWS RIGHT  Result Date: 05/17/2023 CLINICAL DATA:  [id number] Pain [id number] EXAM: RIGHT FEMUR PORTABLE 2 VIEW COMPARISON:  11/16/2021 FINDINGS: Interval right lower extremity amputation at the knee joint. Hip joint alignment is maintained without dislocation. Moderate degenerative changes of the right hip joint. No erosion or periosteal elevation. Soft tissue thickening at the distal stump. No soft tissue gas. IMPRESSION: Interval right lower extremity amputation at the knee joint. Soft tissue thickening at the distal stump. No acute osseous abnormality. Electronically Signed   By: Duanne Guess D.O.   On: 05/17/2023 16:32    Scheduled Meds:  amLODipine  10 mg Oral Daily   ARIPiprazole  5 mg Oral Daily   vitamin C  1,000 mg Oral Daily   carbamide peroxide  5 drop Both EARS BID   carvedilol  6.25 mg Oral BID WC   chlorhexidine  15 mL Mouth/Throat TID   escitalopram  5 mg Oral Daily   feeding supplement  237 mL Oral BID BM   furosemide  20  mg Oral Daily   gabapentin  900 mg Oral TID   hydrALAZINE  25 mg Oral Q8H   isosorbide mononitrate  15 mg Oral Daily   lidocaine  1 patch Transdermal Q24H   melatonin  3 mg Oral QHS   morphine  30 mg Oral Q12H   multivitamin with minerals  1 tablet Oral Daily   nystatin  5 mL Oral QID   Continuous Infusions:   LOS: 162 days   Marguerita Merles, DO [facility] Available via Epic secure chat 7am-7pm After these hours, please refer to coverage provider listed on [email or web address] 05/18/2023, 4:17 PM

## 2023-05-19 DIAGNOSIS — Z5900 Homelessness unspecified: Secondary | ICD-10-CM | POA: Diagnosis not present

## 2023-05-19 DIAGNOSIS — I96 Gangrene, not elsewhere classified: Secondary | ICD-10-CM | POA: Diagnosis not present

## 2023-05-19 DIAGNOSIS — L03116 Cellulitis of left lower limb: Secondary | ICD-10-CM | POA: Diagnosis not present

## 2023-05-19 DIAGNOSIS — L0390 Cellulitis, unspecified: Secondary | ICD-10-CM | POA: Diagnosis not present

## 2023-05-19 NOTE — Progress Notes (Signed)
PROGRESS NOTE    [name]  ZOX:[id number] DOB: [birth year] DOA: 12/07/2022 PCP: [name], NP   Brief Narrative:  [name] is a 57 y.o. male with a history of heart failure, CAD, right BKA, COPD, hyperlipidemia, hyperlipidemia, polyneuropathy, schizoaffective/bipolar disorder, was admitted to the hospital with left lower extremity cellulitis and gangrene.   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      During hospitalization, patient was also noted to have maxillary abscess and periapical abscess and underwent oral surgery with I&D and of anterior maxillary abscess and surgical extraction of multiple teeth in alveoloplasty. Currently stable.   Hospital stay prolonged due to difficulty in placement due to homelessness status status post left BKA and right AKA.   [facility],  have offered to provide a prosthesis for LLE (the BKA)   The patient scheduled an appointment Monday (June 3rd, 2024) at noon to meet up with patient and the Prosthetist to cast the L residual limb and make a plan and patient to receive a prosthesis for his left leg pro bono.  **Patient met with the Prosthetist for his Left Leg who brought initial test socket and will bring another test socket this week (05/18/23)and the patient is to wear the shrinker at all times except when bathing and can wear the shrinker with 2 layers to above his knee or with 1 layer above the knee in 3 to 4 layers more distally to apply more pressure for distal limb shaping.  Per the physical therapist he is to wear the liner during waking hours with initially starting 2 to 3 hours aware and then adding half an hour of wear each day until the patient gets used to wearing the liner.  Still has a difficult disposition given housing issue but CSW is involved.   Assessment and Plan:  Sepsis secondary to cellulitis/gangrene of left foot s/p Left BKA -Initially received  vancomycin, Cefepime and Flagyl.  -Patient underwent 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   -Previous history of right below-knee amputation.    -Patient is wheelchair-bound.  04/27/2023: Stable.  Patient is awaiting disposition. -Patient had furosemide for stump edema and electrolytes were checked and potassium was low normal so we will replete -Discussed with physical therapy and patient is to get a prosthesis for his left side pro bono next week and Prosthetist consult has been placed; Plan is to meet with Prosthetist on Monday June 3rd at noon to cast for LLE mold. -PT assisting patient in donning LLE Shrinker and getting worked up for a prosthesis.  PT evaluated and recommending possible prosthesis for her right leg and have recommended getting an x-ray to evaluate his anatomy -Right leg x-ray done which showed "Interval right lower extremity amputation at the knee joint. Hip joint alignment is maintained without dislocation. Moderate degenerative changes of the right hip joint. No erosion or periosteal elevation. Soft tissue thickening at the distal stump. No soft tissue gas."   Anterior Maxillary Abscess -Status post oral surgery with I&D of anterior maxillary abscess, surgical extraction of teeth 4,5,6,11,12,13,20,21,22,27,28,29. Simple extraction teeth #7,8,9,10,23,24,25,26, Alveoloplasty x 4 quadrants on 03/25/23 Dr [name].   -Patient has completed Augmentin x 7 days.     -Currently on regular fluid consistency diet.  Continue oral hygiene and continue with benzocaine mucosal gel for the mouth and throat 4 times daily as needed for mild pain  Unilateral Hearing Loss in the Setting of Cerumen Impaction -Otoscopic Examination reveals  a severely impacted ear -Start Debrox Both ears -Ear Wax Removal Order Placed for Nursing -Case was discussed with Dr. Christia Reading of ENT who recommends that the patient will benefit from outpatient evaluation given that they have all the tools; he states  that we may be able to get an audiogram but he doubts that -ENT unfortunately states that they cannot do anything inpatient given lack of equipment and cannot undergo microscopic examination while hospitalized -Dr. Jenne Pane states to continue flushing his ears and continue Debrox and adding Ciprodex if necessary given that he feels that we will take time for his earwax to be removed   Chronic Combined Systolic and Diastolic Heart failure -Compensated.   -Patient on Carvedilol 6.25 g p.o. twice daily, Amlodipine 10 mg p.o. daily, Furosemide 20 mg p.o. daily, and Isosorbide mononitrate 50 mg p.o. daily along with Hydralazine 25 mg every 8 -2D echocardiogram from 08/2022 with LV ejection fraction of 40% with global hypokinesis.  -Strict I's and O's and daily weights  Intake/Output Summary (Last 24 hours) at 05/19/2023 1629 Last data filed at 05/19/2023 1500 Gross per 24 hour  Intake 1080 ml  Output 0 ml  Net 1080 ml  -Continue Diuresis with furosemide 20 mg po daily and continue monitor for signs and symptoms of volume overload  Essential Hypertension -on Carvedilol 6.25 g p.o. twice daily, Amlodipine 10 mg p.o. daily, Furosemide 20 mg p.o. daily, and Isosorbide Mononitrate 50 mg p.o. daily along with Hydralazine 25 mg every 8 (Refused BP meds on 05/16/23) -Continue to monitor blood pressures per protocol -Last BP reading was elevated at 156/105   COPD -Compensated. Continue Xopenex 0.63 mg nebs every 6 as needed  -Continue to monitor respiratory status carefully SpO2: 96 % O2 Flow Rate (L/min): 0 L/min   Anxiety -No longer appears to be on Atarax but if necessary will reorder   GERD/GI prophylaxis -Does not want to take Pantoprazole 40 mg po Daily so Discontinued     Constipation -Continue Bowel Regimen with MiraLAX , Dulcolax PRN.  On narcotics   Chronic Pain -On MS Contin 30 mg every 12 scheduled and has oxycodone 10 mg p.o. every 4 as needed for severe pain for 7-10 as well as  tramadol 50 mg p.o. every 12 as needed for moderate pain and also on gabapentin 900 mg p.o. 3 times daily -To be supervised for concerns of previous hoarding medication. -Continue with bowel regimen with bisacodyl 5 mg p.o. daily as needed for moderate constipation, and MiraLAX 17 g p.o. daily as needed along with magnesium citrate 1 bottle once as needed for severe constipation  Shoulder Pain, improving  -Patient having shoulder discomfort so will add Lidocaine Patch and Camphor Topical   Schizoaffective Disorder -Seen by Psychiatry during hospitalization. -Continue Aripiprazole 5 mg p.o. daily and Escitalopram 5 mg p.o. Daily.    Obesity -Complicates overall prognosis and care -Estimated body mass index is 29.3 kg/m as calculated from the following:   Height as of this encounter: 5\' 9"  (1.753 m).   Weight as of this encounter: 90 kg.  -Weight Loss and Dietary Counseling given   DVT prophylaxis: SCD's Start: 12/10/22 1731    Code Status: Full Code Family Communication: No family at bedside friend was altered on room  Disposition Plan:  Level of care: Med-Surg Status is: Inpatient Remains inpatient appropriate because: Has an unsafe discharge disposition and TOC assisting with discharge disposition given lack of housing    Consultants:  Psychiatry Orthopedic surgery Podiatry Discussed  with the ENT Physician   Procedures:  As delineated as above  Antimicrobials:  Anti-infectives (From admission, onward)    Start     Dose/Rate Route Frequency Ordered Stop   03/19/23 0900  amoxicillin-clavulanate (AUGMENTIN) 875-125 MG per tablet 1 tablet        1 tablet Oral Every 12 hours 03/19/23 0809 04/01/23 2118   03/18/23 2000  Ampicillin-Sulbactam (UNASYN) 3 g in sodium chloride 0.9 % 100 mL IVPB  Status:  Discontinued        3 g 200 mL/hr over 30 Minutes Intravenous Every 6 hours 03/18/23 1939 03/19/23 0809   03/18/23 1645  Ampicillin-Sulbactam (UNASYN) 3 g in sodium chloride 0.9  % 100 mL IVPB  Status:  Discontinued        3 g 200 mL/hr over 30 Minutes Intravenous Every 6 hours 03/18/23 1555 03/18/23 1939   12/26/22 2200  doxycycline (VIBRA-TABS) tablet 100 mg        100 mg Oral Every 12 hours 12/26/22 1307 01/08/23 2126   12/24/22 1000  doxycycline (VIBRA-TABS) tablet 100 mg  Status:  Discontinued        100 mg Oral Every 12 hours 12/24/22 0715 12/26/22 1307   12/10/22 1830  ceFAZolin (ANCEF) IVPB 2g/100 mL premix  Status:  Discontinued        2 g 200 mL/hr over 30 Minutes Intravenous Every 8 hours 12/10/22 1731 12/10/22 1740   12/10/22 1330  ceFAZolin (ANCEF) IVPB 2g/100 mL premix        2 g 200 mL/hr over 30 Minutes Intravenous On call to O.R. 12/10/22 0959 12/10/22 1534   12/08/22 0530  vancomycin (VANCOREADY) IVPB 1500 mg/300 mL        1,500 mg 150 mL/hr over 120 Minutes Intravenous Every 12 hours 12/07/22 1741 12/11/22 0955   12/08/22 0345  metroNIDAZOLE (FLAGYL) IVPB 500 mg        500 mg 100 mL/hr over 60 Minutes Intravenous Every 12 hours 12/07/22 1742 12/12/22 0344   12/07/22 2330  ceFEPIme (MAXIPIME) 2 g in sodium chloride 0.9 % 100 mL IVPB        2 g 200 mL/hr over 30 Minutes Intravenous Every 8 hours 12/07/22 1741 12/11/22 1529   12/07/22 2200  metroNIDAZOLE (FLAGYL) IVPB 500 mg  Status:  Discontinued        500 mg 100 mL/hr over 60 Minutes Intravenous Every 12 hours 12/07/22 2048 12/07/22 2056   12/07/22 1530  ceFEPIme (MAXIPIME) 2 g in sodium chloride 0.9 % 100 mL IVPB        2 g 200 mL/hr over 30 Minutes Intravenous  Once 12/07/22 1516 12/07/22 1734   12/07/22 1530  vancomycin (VANCOREADY) IVPB 1500 mg/300 mL        1,500 mg 150 mL/hr over 120 Minutes Intravenous  Once 12/07/22 1516 12/09/22 0333   12/07/22 1515  metroNIDAZOLE (FLAGYL) IVPB 500 mg        500 mg 100 mL/hr over 60 Minutes Intravenous  Once 12/07/22 1506 12/07/22 1734   12/07/22 1500  ceFAZolin (ANCEF) IVPB 1 g/50 mL premix  Status:  Discontinued        1 g 100 mL/hr over 30  Minutes Intravenous  Once 12/07/22 1452 12/07/22 1513       Subjective: And examined at bedside he continues to not hear out of his right ear and states he is only minimally improved since yesterday.  He denies any nausea or vomiting.  Feels okay otherwise and states  that he slept okay.  No other concerns or complaints this time.  Objective: Vitals:   05/18/23 1505 05/18/23 1936 05/19/23 0519 05/19/23 1342  BP: 133/84 132/65 (!) 152/85 (!) 156/105  Pulse: 68 (!) 59 60 89  Resp: 17 20 20 18   Temp:  98.6 F (37 C)  98.3 F (36.8 C)  TempSrc:  Oral  Oral  SpO2: 100% 95% 98% 96%  Weight:      Height:        Intake/Output Summary (Last 24 hours) at 05/19/2023 1631 Last data filed at 05/19/2023 1500 Gross per 24 hour  Intake 1080 ml  Output 0 ml  Net 1080 ml   Filed Weights   05/09/23 0500 05/11/23 0428 05/12/23 0633  Weight: 90.7 kg 90 kg 90 kg   Examination: Physical Exam:  Constitutional: WN/WD ill-appearing African-American male in no acute distress appears calm but continues to complain of loss of hearing in his right ear Respiratory: Diminished to auscultation bilaterally, no wheezing, rales, rhonchi or crackles. Normal respiratory effort and patient is not tachypenic. No accessory muscle use.  Unlabored breathing Cardiovascular: RRR, no murmurs / rubs / gallops. S1 and S2 auscultated. No extremity edema.  Abdomen: Soft, non-tender, distended secondary to body habitus. Bowel sounds positive.  GU: Deferred. Musculoskeletal: No clubbing / cyanosis of digits/nails.  Has a right AKA and a left BKA Skin: No rashes, lesions, ulcers on a limited skin evaluation. No induration; Warm and dry.  Neurologic: CN 2-12 grossly intact but he has extremely hard of hearing on the right compared to left now.  Romberg sign and cerebellar reflexes not assessed Psychiatric: Normal judgment and insight. Alert and oriented x 3. Normal mood and appropriate affect.   Data Reviewed: I have  personally reviewed following labs and imaging studies  CBC: No results for input(s): "WBC", "NEUTROABS", "HGB", "HCT", "MCV", "PLT" in the last 168 hours. Basic Metabolic Panel: No results for input(s): "NA", "K", "CL", "CO2", "GLUCOSE", "BUN", "CREATININE", "CALCIUM", "MG", "PHOS" in the last 168 hours. GFR: Estimated Creatinine Clearance: 108.9 mL/min (by C-G formula based on SCr of 0.85 mg/dL). Liver Function Tests: No results for input(s): "AST", "ALT", "ALKPHOS", "BILITOT", "PROT", "ALBUMIN" in the last 168 hours. No results for input(s): "LIPASE", "AMYLASE" in the last 168 hours. No results for input(s): "AMMONIA" in the last 168 hours. Coagulation Profile: No results for input(s): "INR", "PROTIME" in the last 168 hours. Cardiac Enzymes: No results for input(s): "CKTOTAL", "CKMB", "CKMBINDEX", "TROPONINI" in the last 168 hours. BNP (last 3 results) No results for input(s): "PROBNP" in the last 8760 hours. HbA1C: No results for input(s): "HGBA1C" in the last 72 hours. CBG: No results for input(s): "GLUCAP" in the last 168 hours. Lipid Profile: No results for input(s): "CHOL", "HDL", "LDLCALC", "TRIG", "CHOLHDL", "LDLDIRECT" in the last 72 hours. Thyroid Function Tests: No results for input(s): "TSH", "T4TOTAL", "FREET4", "T3FREE", "THYROIDAB" in the last 72 hours. Anemia Panel: No results for input(s): "VITAMINB12", "FOLATE", "FERRITIN", "TIBC", "IRON", "RETICCTPCT" in the last 72 hours. Sepsis Labs: No results for input(s): "PROCALCITON", "LATICACIDVEN" in the last 168 hours.  No results found for this or any previous visit (from the past 240 hour(s)).   Radiology Studies: No results found.  Scheduled Meds:  amLODipine  10 mg Oral Daily   ARIPiprazole  5 mg Oral Daily   vitamin C  1,000 mg Oral Daily   carbamide peroxide  5 drop Both EARS BID   carvedilol  6.25 mg Oral BID WC   chlorhexidine  15 mL Mouth/Throat TID   escitalopram  5 mg Oral Daily   feeding  supplement  237 mL Oral BID BM   furosemide  20 mg Oral Daily   gabapentin  900 mg Oral TID   hydrALAZINE  25 mg Oral Q8H   isosorbide mononitrate  15 mg Oral Daily   lidocaine  1 patch Transdermal Q24H   melatonin  3 mg Oral QHS   morphine  30 mg Oral Q12H   multivitamin with minerals  1 tablet Oral Daily   nystatin  5 mL Oral QID   Continuous Infusions:   LOS: 163 days   Marguerita Merles, DO [facility] Available via Epic secure chat 7am-7pm After these hours, please refer to coverage provider listed on [email or web address] 05/19/2023, 4:31 PM

## 2023-05-19 NOTE — Progress Notes (Signed)
Mobility Specialist Progress Note   05/19/23 1430  Mobility  Activity Transferred from bed to chair;Transferred from chair to bed (Exercises)  Level of Assistance Standby assist, set-up cues, supervision of patient - no hands on  Assistive Device None  Range of Motion/Exercises Active;All extremities  Activity Response Tolerated well   Patient received in [facility] and agreeable to participate. Propelled in hallway 1156ft independently and requested writer to return later. Upon return, completed A&P transfers to and from [facility] and mat table. Participated in exercises with great effort but needed more encouragement. Session focused on hip flexion/extension, gluteal strength, and core strength. Returned to [facility] without incident and complained of UE soreness. Was left with all needs met.  [place] Deral Schellenberg, BS EXP Mobility Specialist Please contact via SecureChat or Rehab office at [phone]

## 2023-05-20 DIAGNOSIS — I5040 Unspecified combined systolic (congestive) and diastolic (congestive) heart failure: Secondary | ICD-10-CM | POA: Diagnosis not present

## 2023-05-20 DIAGNOSIS — K047 Periapical abscess without sinus: Secondary | ICD-10-CM | POA: Diagnosis not present

## 2023-05-20 DIAGNOSIS — I10 Essential (primary) hypertension: Secondary | ICD-10-CM | POA: Diagnosis not present

## 2023-05-20 DIAGNOSIS — I96 Gangrene, not elsewhere classified: Secondary | ICD-10-CM | POA: Diagnosis not present

## 2023-05-20 LAB — BASIC METABOLIC PANEL
Anion gap: 9 (ref 5–15)
BUN: 15 mg/dL (ref 6–20)
CO2: 25 mmol/L (ref 22–32)
Calcium: 9.2 mg/dL (ref 8.9–10.3)
Chloride: 105 mmol/L (ref 98–111)
Creatinine, Ser: 0.98 mg/dL (ref 0.61–1.24)
GFR, Estimated: >60 mL/min (ref >60)
Glucose, Bld: 155 mg/dL — ABNORMAL HIGH (ref 70–99)
Potassium: 3.7 mmol/L (ref 3.5–5.1)
Sodium: 139 mmol/L (ref 135–145)

## 2023-05-20 LAB — CBC
HCT: 41.7 % (ref 39.0–52.0)
Hemoglobin: 14.1 g/dL (ref 13.0–17.0)
MCH: 30.5 pg (ref 26.0–34.0)
MCHC: 33.8 g/dL (ref 30.0–36.0)
MCV: 90.1 fL (ref 80.0–100.0)
Platelets: 292 10*3/uL (ref 150–400)
RBC: 4.63 MIL/uL (ref 4.22–5.81)
RDW: 14.2 % (ref 11.5–15.5)
WBC: 5.9 10*3/uL (ref 4.0–10.5)
nRBC: 0 % (ref 0.0–0.2)

## 2023-05-20 LAB — MAGNESIUM: Magnesium: 2 mg/dL (ref 1.7–2.4)

## 2023-05-20 MED ORDER — ENOXAPARIN SODIUM 30 MG/0.3ML IJ SOSY: 30.0000 mg | PREFILLED_SYRINGE | INTRAMUSCULAR | Status: DC

## 2023-05-20 MED ORDER — ENOXAPARIN SODIUM 40 MG/0.4ML IJ SOSY
40.0000 mg | PREFILLED_SYRINGE | INTRAMUSCULAR | Status: DC
  Administered 2023-05-28 – 2023-06-08 (×11): 40 mg via SUBCUTANEOUS
  Filled 2023-05-20 (×22): qty 0.4

## 2023-05-20 NOTE — Progress Notes (Signed)
Physical Therapy Treatment Patient Details Name: [name] MRN: [id number] DOB: 1966/08/04 Today's Date: 05/20/2023   History of Present Illness Pt is a 57 y/o M who presented 12/17/22 with L ankle cellulitis and underwent L BKA on 1/10. Oral abscesses noted, underwent oral surgery Wed, 4/24; Initiated fabrication of prosthesis for LLE on 6/3; PMH includes asthma, bioplar 1, depression, schizoaffective disorder, CVA, chronic CHF, homelessness, HTN, DM2, cocaine and cannabis use, and R knee disarticulation    PT Comments    Continuing work on functional mobility and activity tolerance;  session focused on UE stretches, and teaching a warm-up routine (outlined below) to perform prior to workouts, Mobility sessions and PT sessions, and prn; [name] performed stretches well with occasional cues for form; Reported his shoulders and arms felt better after stretching (has been quite sore after workouts and initial prosthesis standing trials);   Practiced donning and doffing liner; after demonstration by this PT, [name] was able to don/doff liner without physical assist; We discussed following the wear schedule on his door this week again (and he can feel free to wear longer if he wants); we anticipate getting his LLE prosthesis this coming Monday, 6/24; [name] stated his goal of going from sit to stand within the parallel bars without physical assist; [name] also intends to wear his prosthesis as much as he can tolerate once he gets it;   Radiograph confirmed his RLE is a knee disarticulation, and there is soft tissue thickening at the distal end of his residual limb; He's quite tender to palpation, and I recommended to him to add self soft-tissue massage to his RLE as well as his LLE;   Looking forward to Monday -- if he gets his prosthesis as anticipated, will likely re-consult OT    Recommendations for follow up therapy are one component of a multi-disciplinary discharge planning process, led by the  attending physician.  Recommendations may be updated based on patient status, additional functional criteria and insurance authorization.  Follow Up Recommendations  Can patient physically be transported by private vehicle: Yes (Currently recommend a sliding board for car transfers; may not need once he gets L prosthesis)    Assistance Recommended at Discharge PRN  Patient can return home with the following Assist for transportation   Equipment Recommendations  Other (comment) (tub bench, sliding board)    Recommendations for Other Services  (Recommend starting the Mental/Behavioral Health support through [name] to start while [name] is here in the hospital; [name] consult)     Precautions / Restrictions Precautions Precautions: None Precaution Comments: R knee disrticulation, L BKA; Fall risk is present, but minimal Restrictions RLE Weight Bearing: Weight bearing as tolerated LLE Weight Bearing: Weight bearing as tolerated     Mobility  Bed Mobility                 Patient Response: Cooperative  Transfers Overall transfer level:  (Modified independent with wc transfers)                      Ambulation/Gait                   Stairs             Wheelchair Mobility    Modified Rankin (Stroke Patients Only)       Balance  Cognition Arousal/Alertness: Awake/alert Behavior During Therapy: WFL for tasks assessed/performed Overall Cognitive Status: Within Functional Limits for tasks assessed (for simple mobility tasks)                                 General Comments: When [name] understands rationale for an exercise or activity, and that rationale is in line with his goals, he participates much better        Exercises Amputee Exercises Hip Extension:  (This PT demonstrated supine hip extension stretches with LEs hanging off of mat table) Other  Exercises Other Exercises: Taught simple UE "warm up" routine with shoulder rolls backward and forward, and upper trunk rotation with elbows up, followed by stretches below Other Exercises: seated sidebody/lats stretch R and L; noting better stretch of R side when [name]" R UE a bit anteriorly Other Exercises: Deltoid stretch with modifications as needed R and L UEs; better form today than previous session Other Exercises: Tricep stretches R and LUEs, with cues for form; ; used elbow against wall for stability and gentle overpressure Other Exercises: Chest opening/pectoral stretches R and L UEs with hand up the doorway wall and leaning forward; better stretch when [name] each arm infividually    General Comments General comments (skin integrity, edema, etc.): Reports is wearing his shrinkers and liner consistently, if not quite completely as recommended; reports no difficulty washing his liner in the bathroom;      Pertinent Vitals/Pain Pain Assessment Faces Pain Scale: Hurts even more Pain Location: L distal residual limb with weight bearing in initial prosthesis socket trial Pain Descriptors / Indicators: Grimacing    Home Living                          Prior Function            PT Goals (current goals can now be found in the care plan section) Acute Rehab PT Goals Patient Stated Goal: Prepare for prosthetics fitting; Would like to get housing in [facility] through the Transition to [employer] PT Goal Formulation: With patient Time For Goal Achievement: 05/19/23 Potential to Achieve Goals: Good Additional Goals Additional Goal #1: Pt will transfers from seated to floor, and tehn from floor back to seated in wc or chair modified independently Additional Goal #2: Pt will perform a car transfer with modified independence Progress towards PT goals: Goals met and updated - see care plan (Next goal update due 7/3)    Frequency    Min  1X/week      PT Plan Current plan remains appropriate;Frequency needs to be updated (When prosthesis training begins, will incr frequency; anticipate will begin 2x/week starting Monday, 6/24)    Co-evaluation              AM-PAC PT "6 Clicks" Mobility   Outcome Measure  Help needed turning from your back to your side while in a flat bed without using bedrails?: None Help needed moving from lying on your back to sitting on the side of a flat bed without using bedrails?: None Help needed moving to and from a bed to a chair (including a wheelchair)?: None Help needed standing up from a chair using your arms (e.g., wheelchair or bedside chair)?: Total Help needed to walk in hospital room?: Total Help needed climbing 3-5 steps with a railing? : Total 6 Click Score: 15  End of Session Equipment Utilized During Treatment: Other (comment);Gait belt (revised socket provided by [facility]) Activity Tolerance: Patient tolerated treatment well Patient left:  (managing independently) Nurse Communication: Mobility status PT Visit Diagnosis: Muscle weakness (generalized) (M62.81);Other abnormalities of gait and mobility (R26.89)     Time: 1340-1410 PT Time Calculation (min) (ACUTE ONLY): 30 min  Charges:  $Therapeutic Exercise: 8-22 mins $Therapeutic Activity: 8-22 mins                     Van Clines, PT  Acute Rehabilitation Services Office [phone] Secure Chat welcomed    [name] 05/20/2023, 4:42 PM

## 2023-05-20 NOTE — Plan of Care (Signed)
  Problem: Pain Managment: Goal: General experience of comfort will improve Outcome: Not Progressing   Problem: Safety: Goal: Ability to remain free from injury will improve Outcome: Not Progressing   Problem: Skin Integrity: Goal: Risk for impaired skin integrity will decrease Outcome: Not Progressing   Problem: Education: Goal: Knowledge of General Education information will improve Description: Including pain rating scale, medication(s)/side effects and non-pharmacologic comfort measures Outcome: Not Progressing   Problem: Health Behavior/Discharge Planning: Goal: Ability to manage health-related needs will improve Outcome: Not Progressing   Problem: Clinical Measurements: Goal: Ability to maintain clinical measurements within normal limits will improve Outcome: Not Progressing Goal: Will remain free from infection Outcome: Not Progressing Goal: Diagnostic test results will improve Outcome: Not Progressing Goal: Respiratory complications will improve Outcome: Not Progressing Goal: Cardiovascular complication will be avoided Outcome: Not Progressing

## 2023-05-20 NOTE — Progress Notes (Signed)
PROGRESS NOTE    Sheikh Homewood  WUJ:[id number] DOB: 07/01/1966 DOA: 12/07/2022 PCP: [name], NP    Chief Complaint  Patient presents with   Leg Swelling   Leg Pain    Brief Narrative:  Anuj Gunnoe is a 57 y.o. male with a history of heart failure, CAD, right BKA, COPD, hyperlipidemia, hyperlipidemia, polyneuropathy, schizoaffective/bipolar disorder, was admitted to the hospital with left lower extremity cellulitis and gangrene.   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       During hospitalization, patient was also noted to have maxillary abscess and periapical abscess and underwent oral surgery with I&D and of anterior maxillary abscess and surgical extraction of multiple teeth in alveoloplasty. Currently stable.   Hospital stay prolonged due to difficulty in placement due to homelessness status status post left BKA and right AKA.   [facility],  have offered to provide a prosthesis for LLE (the BKA)    The patient scheduled an appointment Monday (June 3rd, 2024) at noon to meet up with patient and the Prosthetist to cast the L residual limb and make a plan and patient to receive a prosthesis for his left leg pro bono.   **Patient met with the Prosthetist for his Left Leg who brought initial test socket and will bring another test socket this week (05/18/23)and the patient is to wear the shrinker at all times except when bathing and can wear the shrinker with 2 layers to above his knee or with 1 layer above the knee in 3 to 4 layers more distally to apply more pressure for distal limb shaping.  Per the physical therapist he is to wear the liner during waking hours with initially starting 2 to 3 hours aware and then adding half an hour of wear each day until the patient gets used to wearing the liner.   Still has a difficult disposition given housing issue but CSW is involved.    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 -Patient seen in consultation by orthopedics underwent left BKA 12/10/2022. -Patient with prior history of right BKA, wheelchair-bound. -Patient noted to have been stable and awaiting disposition. -Patient on Lasix.  Edema electrolytes were checked potassium was low and repleted. -Repeat labs obtained today with stable electrolytes. -Per physical therapy patient to get a prosthesis for his left side pro bono next week and prosthesis consult placed. -Plan was for patient to meet with prosthelist June 3 at noon to cast for left lower extremity mold. -PT assisting in donning left lower extremity shrinker and getting worked up for prosthesis. -PT also recommending possible prosthesis for right leg and recommended getting an x-ray to evaluate his anatomy which showed interval right lower extremity amputation at the knee joint.  Hip joint alignment maintained without dislocation.  Moderate degenerative changes of the right hip joint.  No erosion or periosteal elevation.  Soft tissue thickening at the distal stump.  No soft  tissue gas. -PT following.  2.  Anterior maxillary abscess -Patient seen in consultation by oral surgeon, Dr. [name]. -Patient is status post I&D of anterior maxillary abscess, surgical extraction of teeth 4,5,6 ,11,12, 13, 20, 21, 22, 27, 28, 29. -Simple extraction teeth # 7, 8, 9, 10, 23, 24, 25, 26. -Status post angioplasty x 4 quadrants on  03/25/2023 per Dr. [name]. -Status post 7 days of Augmentin. -Patient currently on a regular diet. Continue oral hygiene and continue benzocaine mucosal gel for the mouth and throat 4 times daily as needed for mild pain.  3.  Unilateral hearing loss in the send no cerumen impaction -Otoscopic exam revealed severely impacted ear. -Continue Debrox to both ears. -Earwax removal order placed. -Prior physician discussed case with Dr. Jenne Pane ENT who recommended that patient benefit from outpatient evaluation given that they have all the tools in the office. -ENT also stated may be able to get an audiogram but doubts that -ENT unfortunately states cannot do anything in-house given lack of equipment and cannot undergo microscopic examination while hospitalized. -ENT, Dr. Jenne Pane recommended continued flushing of the ears and continue Debrox and adding Ciprodex if necessary given that he feels that will take time for earwax to be removed.  4.  Chronic combined systolic diastolic CHF -Compensated. -2D echo done 08/2022 with a EF of 40% with global hypokinesis. -Continue Coreg, Norvasc, Lasix, Imdur, hydralazine.  5.  Hypertension -Continue current regimen of Coreg, Norvasc, Lasix, Imdur, hydralazine.  6.  COPD -Stable. -Xopenex nebs as needed.  7.  Anxiety -Stable. -Was on hydroxyzine as needed.  8.  GERD -Patient refusing PPI daily.  9.  Constipation -Continue current bowel regimen with MiraLAX, Dulcolax as needed.  10.  Chronic pain -Continue current pain regimen of MS Contin 30 mg twice daily, oxycodone 10 mg every 4 hours as needed severe pain, tramadol 50 mg every 12 hours as needed moderate pain, gabapentin 900 mg 3 times daily.  11.  Shoulder pain -Continue lidocaine patch and camphor topical.  12.  Schizoaffective disorder -Continue Abilify, Lexapro. -Was seen by psychiatry during the hospitalization.  13.  Obesity -BMI 29.3 kg/m. -Lifestyle modification -Outpatient  follow-up.   DVT prophylaxis: Lovenox Code Status: Full Family Communication: Updated patient.  No family at bedside. Disposition: Unsafe disposition.  Patient with lack of housing.  TOC assisting.  Status is: Inpatient Remains inpatient appropriate because: Homelessness/unsafe disposition   Consultants:  Wound care RN Ria Bush 12/07/2022 Orthopedics: Dr. Lajoyce Corners 12/09/2022 Psychiatry: Dr. Viviano Simas 02/26/2023 Oral surgeon: Dr. [name] 03/20/2023  Procedures:  As noted above.  Antimicrobials:  Anti-infectives (From admission, onward)    Start     Dose/Rate Route Frequency Ordered Stop   03/19/23 0900  amoxicillin-clavulanate (AUGMENTIN) 875-125 MG per tablet 1 tablet        1 tablet Oral Every 12 hours 03/19/23 0809 04/01/23 2118   03/18/23 2000  Ampicillin-Sulbactam (UNASYN) 3 g in sodium chloride 0.9 % 100 mL IVPB  Status:  Discontinued        3 g 200 mL/hr over 30 Minutes Intravenous Every 6 hours 03/18/23 1939 03/19/23 0809   03/18/23 1645  Ampicillin-Sulbactam (UNASYN) 3 g in sodium chloride 0.9 % 100 mL IVPB  Status:  Discontinued        3 g 200 mL/hr over 30 Minutes Intravenous Every 6 hours 03/18/23 1555 03/18/23 1939   12/26/22 2200  doxycycline (VIBRA-TABS) tablet 100 mg        100 mg Oral Every 12 hours 12/26/22  1307 01/08/23 2126   12/24/22 1000  doxycycline (VIBRA-TABS) tablet 100 mg  Status:  Discontinued        100 mg Oral Every 12 hours 12/24/22 0715 12/26/22 1307   12/10/22 1830  ceFAZolin (ANCEF) IVPB 2g/100 mL premix  Status:  Discontinued        2 g 200 mL/hr over 30 Minutes Intravenous Every 8 hours 12/10/22 1731 12/10/22 1740   12/10/22 1330  ceFAZolin (ANCEF) IVPB 2g/100 mL premix        2 g 200 mL/hr over 30 Minutes Intravenous On call to O.R. 12/10/22 0959 12/10/22 1534   12/08/22 0530  vancomycin (VANCOREADY) IVPB 1500 mg/300 mL        1,500 mg 150 mL/hr over 120 Minutes Intravenous Every 12 hours 12/07/22 1741 12/11/22 0955   12/08/22 0345  metroNIDAZOLE  (FLAGYL) IVPB 500 mg        500 mg 100 mL/hr over 60 Minutes Intravenous Every 12 hours 12/07/22 1742 12/12/22 0344   12/07/22 2330  ceFEPIme (MAXIPIME) 2 g in sodium chloride 0.9 % 100 mL IVPB        2 g 200 mL/hr over 30 Minutes Intravenous Every 8 hours 12/07/22 1741 12/11/22 1529   12/07/22 2200  metroNIDAZOLE (FLAGYL) IVPB 500 mg  Status:  Discontinued        500 mg 100 mL/hr over 60 Minutes Intravenous Every 12 hours 12/07/22 2048 12/07/22 2056   12/07/22 1530  ceFEPIme (MAXIPIME) 2 g in sodium chloride 0.9 % 100 mL IVPB        2 g 200 mL/hr over 30 Minutes Intravenous  Once 12/07/22 1516 12/07/22 1734   12/07/22 1530  vancomycin (VANCOREADY) IVPB 1500 mg/300 mL        1,500 mg 150 mL/hr over 120 Minutes Intravenous  Once 12/07/22 1516 12/09/22 0333   12/07/22 1515  metroNIDAZOLE (FLAGYL) IVPB 500 mg        500 mg 100 mL/hr over 60 Minutes Intravenous  Once 12/07/22 1506 12/07/22 1734   12/07/22 1500  ceFAZolin (ANCEF) IVPB 1 g/50 mL premix  Status:  Discontinued        1 g 100 mL/hr over 30 Minutes Intravenous  Once 12/07/22 1452 12/07/22 1513         Subjective: Sitting up in chair.  Denies any chest pain or shortness of breath.  States can hear too well with cerumen impaction.  Getting ready to work with therapy.  Agrees for lab work today and states will not get any more labs until a month goes by.  Objective: Vitals:   05/19/23 0519 05/19/23 1342 05/20/23 0506 05/20/23 0910  BP: (!) 152/85 (!) 156/105 (!) 150/80 (!) 143/82  Pulse: 60 89 83 60  Resp: 20 18 17 18   Temp:  98.3 F (36.8 C) 98.3 F (36.8 C) 98.4 F (36.9 C)  TempSrc:  Oral Oral   SpO2: 98% 96% 97% 92%  Weight:      Height:        Intake/Output Summary (Last 24 hours) at 05/20/2023 1154 Last data filed at 05/19/2023 2230 Gross per 24 hour  Intake 1200 ml  Output --  Net 1200 ml   Filed Weights   05/09/23 0500 05/11/23 0428 05/12/23 0633  Weight: 90.7 kg 90 kg 90 kg     Examination:  General exam: Appears calm and comfortable  Respiratory system: Clear to auscultation. Respiratory effort normal. Cardiovascular system: S1 & S2 heard, RRR. No JVD, murmurs, rubs, gallops  or clicks. No pedal edema. Gastrointestinal system: Abdomen is nondistended, soft and nontender. No organomegaly or masses felt. Normal bowel sounds heard. Central nervous system: Alert and oriented. No focal neurological deficits. Extremities: Status post left BKA and right AKA.  Skin: No rashes, lesions or ulcers Psychiatry: Judgement and insight appear normal. Mood & affect appropriate.     Data Reviewed: I have personally reviewed following labs and imaging studies  CBC: No results for input(s): "WBC", "NEUTROABS", "HGB", "HCT", "MCV", "PLT" in the last 168 hours.  Basic Metabolic Panel: No results for input(s): "NA", "K", "CL", "CO2", "GLUCOSE", "BUN", "CREATININE", "CALCIUM", "MG", "PHOS" in the last 168 hours.  GFR: CrCl cannot be calculated (Patient's most recent lab result is older than the maximum 21 days allowed.).  Liver Function Tests: No results for input(s): "AST", "ALT", "ALKPHOS", "BILITOT", "PROT", "ALBUMIN" in the last 168 hours.  CBG: No results for input(s): "GLUCAP" in the last 168 hours.   No results found for this or any previous visit (from the past 240 hour(s)).       Radiology Studies: No results found.      Scheduled Meds:  amLODipine  10 mg Oral Daily   ARIPiprazole  5 mg Oral Daily   vitamin C  1,000 mg Oral Daily   carbamide peroxide  5 drop Both EARS BID   carvedilol  6.25 mg Oral BID WC   chlorhexidine  15 mL Mouth/Throat TID   escitalopram  5 mg Oral Daily   feeding supplement  237 mL Oral BID BM   furosemide  20 mg Oral Daily   gabapentin  900 mg Oral TID   hydrALAZINE  25 mg Oral Q8H   isosorbide mononitrate  15 mg Oral Daily   lidocaine  1 patch Transdermal Q24H   melatonin  3 mg Oral QHS   morphine  30 mg Oral Q12H    multivitamin with minerals  1 tablet Oral Daily   nystatin  5 mL Oral QID   Continuous Infusions:   LOS: 164 days    Time spent: 40 minutes    Ramiro Harvest, MD [facility]   To contact the attending provider between 7A-7P or the covering provider during after hours 7P-7A, please log into the web site [email or web address] and access using universal [place] password for that web site. If you do not have the password, please call the hospital operator.  05/20/2023, 11:54 AM

## 2023-05-21 DIAGNOSIS — K047 Periapical abscess without sinus: Secondary | ICD-10-CM | POA: Diagnosis not present

## 2023-05-21 DIAGNOSIS — I5040 Unspecified combined systolic (congestive) and diastolic (congestive) heart failure: Secondary | ICD-10-CM | POA: Diagnosis not present

## 2023-05-21 DIAGNOSIS — I10 Essential (primary) hypertension: Secondary | ICD-10-CM | POA: Diagnosis not present

## 2023-05-21 DIAGNOSIS — I96 Gangrene, not elsewhere classified: Secondary | ICD-10-CM | POA: Diagnosis not present

## 2023-05-21 NOTE — TOC Progression Note (Signed)
Transition of Care [facility]) - Progression Note    Patient Details  Name: [name] MRN: [id number] Date of Birth: 1966/07/04  [facility] CM/SW Contact  [name], [name] Phone Number: 05/21/2023, 10:43 AM  Clinical Narrative:    CSW continuing to follow up for discharge planning. There are no discharge updates available at this time.   Expected Discharge Plan: Home/Self Care Barriers to Discharge: Homeless with medical needs  Expected Discharge Plan and Services   Discharge Planning Services: CM Consult Post Acute Care Choice: Home Health Living arrangements for the past 2 months: Homeless                     Social Determinants of Health (SDOH) Interventions SDOH Screenings   Food Insecurity: Food Insecurity Present (11/24/2022)  Housing: Medium Risk (12/07/2022)  Transportation Needs: Unmet Transportation Needs (12/07/2022)  Utilities: Not At Risk (12/07/2022)  Alcohol Screen: Medium Risk (06/24/2019)  Depression (PHQ2-9): Medium Risk (02/10/2021)  Tobacco Use: High Risk (03/26/2023)    Readmission Risk Interventions    12/09/2022    9:02 AM 12/01/2022    3:56 PM 08/18/2022    9:00 AM  Readmission Risk Prevention Plan  Transportation Screening Complete Complete Complete  Medication Review Oceanographer) Complete Complete Complete  PCP or Specialist appointment within 3-5 days of discharge Complete Complete Complete  HRI or Home Care Consult Complete Complete Complete  SW Recovery Care/Counseling Consult Complete Complete Complete  Palliative Care Screening Not Applicable Not Applicable Not Applicable  Skilled Nursing Facility Not Applicable Not Applicable Not Applicable  SNF Comments  n/a

## 2023-05-21 NOTE — Progress Notes (Signed)
Mobility Specialist Progress Note:    05/21/23 1529  Mobility  Activity Transferred from bed to chair (upper extremity stretches and ROM, core and glute exercises)  Level of Assistance Modified independent, requires aide device or extra time  Assistive Device Wheelchair  Range of Motion/Exercises Active  Activity Response Tolerated well  Mobility Referral Yes  $Mobility charge 1 Mobility  Mobility Specialist Start Time (ACUTE ONLY) 1438  Mobility Specialist Stop Time (ACUTE ONLY) 1528  Mobility Specialist Time Calculation (min) (ACUTE ONLY) 50 min   Pt received in wheelchair, agreeable to participate. Completed A&P transfer to mat table, performed core and glute tilt exercises. Also, completed BUE ROM and stretches. Tolerated great w/o c/o or incident. Returned to room and donned LLE liner independently. Was left with dinner, all needs met.     Thompson Grayer Mobility Specialist  Please contact vis Secure Chat or  Rehab Office [phone]

## 2023-05-21 NOTE — Progress Notes (Signed)
PROGRESS NOTE    Min Sarra  ZOX:[id number] DOB: Dec 26, 1965 DOA: 12/07/2022 PCP: [name], NP    Chief Complaint  Patient presents with   Leg Swelling   Leg Pain    Brief Narrative:  [name] is a 57 y.o. male with a history of heart failure, CAD, right BKA, COPD, hyperlipidemia, hyperlipidemia, polyneuropathy, schizoaffective/bipolar disorder, was admitted to the hospital with left lower extremity cellulitis and gangrene.   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       During hospitalization, patient was also noted to have maxillary abscess and periapical abscess and underwent oral surgery with I&D and of anterior maxillary abscess and surgical extraction of multiple teeth in alveoloplasty. Currently stable.   Hospital stay prolonged due to difficulty in placement due to homelessness status status post left BKA and right AKA.   [facility],  have offered to provide a prosthesis for LLE (the BKA)    The patient scheduled an appointment Monday (June 3rd, 2024) at noon to meet up with patient and the Prosthetist to cast the L residual limb and make a plan and patient to receive a prosthesis for his left leg pro bono.   **Patient met with the Prosthetist for his Left Leg who brought initial test socket and will bring another test socket this week (05/18/23)and the patient is to wear the shrinker at all times except when bathing and can wear the shrinker with 2 layers to above his knee or with 1 layer above the knee in 3 to 4 layers more distally to apply more pressure for distal limb shaping.  Per the physical therapist he is to wear the liner during waking hours with initially starting 2 to 3 hours aware and then adding half an hour of wear each day until the patient gets used to wearing the liner.   Still has a difficult disposition given housing issue but CSW is involved.    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 -Patient seen in consultation by orthopedics underwent left BKA 12/10/2022. -Patient with prior history of right BKA, wheelchair-bound. -Patient noted to have been stable and awaiting disposition. -Patient on Lasix.  Edema electrolytes were checked potassium was low and repleted. -Repeat labs obtained today with stable electrolytes. -Per physical therapy patient to get a prosthesis for his left side pro bono next week and prosthesis consult placed. -Plan was for patient to meet with prosthelist June 3 at noon to cast for left lower extremity mold. -PT assisting in donning left lower extremity shrinker and getting worked up for prosthesis. -PT also recommending possible prosthesis for right leg and recommended getting an x-ray to evaluate his anatomy which showed interval right lower extremity amputation at the knee joint.  Hip joint alignment maintained without dislocation.  Moderate degenerative changes of the right hip joint.  No erosion or periosteal elevation.  Soft tissue thickening at the distal stump.  No soft  tissue gas. -PT following.  2.  Anterior maxillary abscess -Patient seen in consultation by oral surgeon, Dr. [name]. -Patient is status post I&D of anterior maxillary abscess, surgical extraction of teeth 4,5,6 ,11,12, 13, 20, 21, 22, 27, 28, 29. -Simple extraction teeth # 7, 8, 9, 10, 23, 24, 25, 26. -Status post angioplasty x 4 quadrants on  03/25/2023 per Dr. [name]. -Status post 7 days of Augmentin. -Patient currently on a regular diet. Continue oral hygiene and continue benzocaine mucosal gel for the mouth and throat 4 times daily as needed for mild pain.  3.  Unilateral hearing loss in the send no cerumen impaction -Otoscopic exam revealed severely impacted ear. -Continue Debrox to both ears. -Earwax removal order placed. -Prior physician discussed case with Dr. Jenne Pane ENT who recommended that patient benefit from outpatient evaluation given that they have all the tools in the office. -ENT also stated may be able to get an audiogram but doubts that -ENT unfortunately states cannot do anything in-house given lack of equipment and cannot undergo microscopic examination while hospitalized. -ENT, Dr. Jenne Pane recommended continued flushing of the ears and continue Debrox and adding Ciprodex if necessary given that he feels that will take time for earwax to be removed.  4.  Chronic combined systolic diastolic CHF -Compensated. -2D echo done 08/2022 with a EF of 40% with global hypokinesis. -Norvasc, Lasix, Coreg, Imdur, hydralazine.   5.  Hypertension -Continue Norvasc, Coreg, Lasix, Imdur, hydralazine.   6.  COPD -Stable. -Xopenex nebs as needed.  7.  Anxiety -Stable. -Was on hydroxyzine as needed.  8.  GERD -Patient noted to have refused PPI.   9.  Constipation -Continue bowel regimen of MiraLAX, Dulcolax as needed.  10.  Chronic pain -Continue current pain regimen of MS Contin 30 mg twice daily, oxycodone 10 mg every 4 hours as needed severe pain, tramadol 50 mg every 12 hours as needed moderate pain, gabapentin 900 mg 3 times daily.  11.  Shoulder pain -Continue lidocaine patch and camphor topical.  12.  Schizoaffective disorder -Lexapro, Abilify.  -Was seen by psychiatry during the hospitalization.  13.  Obesity -BMI 29.3 kg/m. -Lifestyle modification -Outpatient follow-up.   DVT prophylaxis:  Lovenox Code Status: Full Family Communication: Updated patient.  No family at bedside. Disposition: Unsafe disposition.  Patient with lack of housing.  TOC assisting.  Status is: Inpatient Remains inpatient appropriate because: Homelessness/unsafe disposition   Consultants:  Wound care RN Ria Bush 12/07/2022 Orthopedics: Dr. Lajoyce Corners 12/09/2022 Psychiatry: Dr. Viviano Simas 02/26/2023 Oral surgeon: Dr. [name] 03/20/2023  Procedures:  As noted above.  Antimicrobials:  Anti-infectives (From admission, onward)    Start     Dose/Rate Route Frequency Ordered Stop   03/19/23 0900  amoxicillin-clavulanate (AUGMENTIN) 875-125 MG per tablet 1 tablet        1 tablet Oral Every 12 hours 03/19/23 0809 04/01/23 2118   03/18/23 2000  Ampicillin-Sulbactam (UNASYN) 3 g in sodium chloride 0.9 % 100 mL IVPB  Status:  Discontinued        3 g 200 mL/hr over 30 Minutes Intravenous Every 6 hours 03/18/23 1939 03/19/23 0809   03/18/23 1645  Ampicillin-Sulbactam (UNASYN) 3 g in sodium chloride 0.9 % 100 mL IVPB  Status:  Discontinued        3 g 200 mL/hr over 30 Minutes Intravenous Every 6 hours 03/18/23 1555 03/18/23 1939   12/26/22 2200  doxycycline (VIBRA-TABS) tablet 100 mg        100 mg Oral Every 12 hours 12/26/22  1307 01/08/23 2126   12/24/22 1000  doxycycline (VIBRA-TABS) tablet 100 mg  Status:  Discontinued        100 mg Oral Every 12 hours 12/24/22 0715 12/26/22 1307   12/10/22 1830  ceFAZolin (ANCEF) IVPB 2g/100 mL premix  Status:  Discontinued        2 g 200 mL/hr over 30 Minutes Intravenous Every 8 hours 12/10/22 1731 12/10/22 1740   12/10/22 1330  ceFAZolin (ANCEF) IVPB 2g/100 mL premix        2 g 200 mL/hr over 30 Minutes Intravenous On call to O.R. 12/10/22 0959 12/10/22 1534   12/08/22 0530  vancomycin (VANCOREADY) IVPB 1500 mg/300 mL        1,500 mg 150 mL/hr over 120 Minutes Intravenous Every 12 hours 12/07/22 1741 12/11/22 0955   12/08/22 0345  metroNIDAZOLE (FLAGYL) IVPB 500 mg        500  mg 100 mL/hr over 60 Minutes Intravenous Every 12 hours 12/07/22 1742 12/12/22 0344   12/07/22 2330  ceFEPIme (MAXIPIME) 2 g in sodium chloride 0.9 % 100 mL IVPB        2 g 200 mL/hr over 30 Minutes Intravenous Every 8 hours 12/07/22 1741 12/11/22 1529   12/07/22 2200  metroNIDAZOLE (FLAGYL) IVPB 500 mg  Status:  Discontinued        500 mg 100 mL/hr over 60 Minutes Intravenous Every 12 hours 12/07/22 2048 12/07/22 2056   12/07/22 1530  ceFEPIme (MAXIPIME) 2 g in sodium chloride 0.9 % 100 mL IVPB        2 g 200 mL/hr over 30 Minutes Intravenous  Once 12/07/22 1516 12/07/22 1734   12/07/22 1530  vancomycin (VANCOREADY) IVPB 1500 mg/300 mL        1,500 mg 150 mL/hr over 120 Minutes Intravenous  Once 12/07/22 1516 12/09/22 0333   12/07/22 1515  metroNIDAZOLE (FLAGYL) IVPB 500 mg        500 mg 100 mL/hr over 60 Minutes Intravenous  Once 12/07/22 1506 12/07/22 1734   12/07/22 1500  ceFAZolin (ANCEF) IVPB 1 g/50 mL premix  Status:  Discontinued        1 g 100 mL/hr over 30 Minutes Intravenous  Once 12/07/22 1452 12/07/22 1513         Subjective: Sitting up at the side of the bed.  No chest pain.  No shortness of breath.  No abdominal pain.    Objective: Vitals:   05/20/23 1303 05/20/23 1928 05/21/23 0449 05/21/23 0734  BP: (!) 170/99 116/61 (!) 153/81 (!) 154/79  Pulse: 80 69 (!) 57 66  Resp: 18 17 18 18   Temp: 98 F (36.7 C) 98.6 F (37 C)  97.8 F (36.6 C)  TempSrc:    Oral  SpO2: 98% 90% 98% 100%  Weight:      Height:       No intake or output data in the 24 hours ending 05/21/23 1141  Filed Weights   05/09/23 0500 05/11/23 0428 05/12/23 0633  Weight: 90.7 kg 90 kg 90 kg    Examination:  General exam: NAD. Respiratory system: CTAB.  No wheezes, no crackles, no rhonchi.  Fair air movement.  Speaking full sentences.   Cardiovascular system: RRR no murmurs rubs or gallops.  No JVD.  No lower extremity edema. Gastrointestinal system: Abdomen is soft, nontender,  nondistended, positive bowel sounds.  No rebound.  No guarding.  Central nervous system: Alert and oriented. No focal neurological deficits. Extremities: Status post left BKA  and right AKA.  Skin: No rashes, lesions or ulcers Psychiatry: Judgement and insight appear normal. Mood & affect appropriate.     Data Reviewed: I have personally reviewed following labs and imaging studies  CBC: Recent Labs  Lab 05/20/23 1216  WBC 5.9  HGB 14.1  HCT 41.7  MCV 90.1  PLT 292    Basic Metabolic Panel: Recent Labs  Lab 05/20/23 1216  NA 139  K 3.7  CL 105  CO2 25  GLUCOSE 155*  BUN 15  CREATININE 0.98  CALCIUM 9.2  MG 2.0    GFR: Estimated Creatinine Clearance: 94.4 mL/min (by C-G formula based on SCr of 0.98 mg/dL).  Liver Function Tests: No results for input(s): "AST", "ALT", "ALKPHOS", "BILITOT", "PROT", "ALBUMIN" in the last 168 hours.  CBG: No results for input(s): "GLUCAP" in the last 168 hours.   No results found for this or any previous visit (from the past 240 hour(s)).       Radiology Studies: No results found.      Scheduled Meds:  amLODipine  10 mg Oral Daily   ARIPiprazole  5 mg Oral Daily   vitamin C  1,000 mg Oral Daily   carbamide peroxide  5 drop Both EARS BID   carvedilol  6.25 mg Oral BID WC   chlorhexidine  15 mL Mouth/Throat TID   enoxaparin (LOVENOX) injection  40 mg Subcutaneous Q24H   escitalopram  5 mg Oral Daily   feeding supplement  237 mL Oral BID BM   furosemide  20 mg Oral Daily   gabapentin  900 mg Oral TID   hydrALAZINE  25 mg Oral Q8H   isosorbide mononitrate  15 mg Oral Daily   lidocaine  1 patch Transdermal Q24H   melatonin  3 mg Oral QHS   morphine  30 mg Oral Q12H   multivitamin with minerals  1 tablet Oral Daily   nystatin  5 mL Oral QID   Continuous Infusions:   LOS: 165 days    Time spent: 40 minutes    Ramiro Harvest, MD [facility]   To contact the attending provider between 7A-7P or the  covering provider during after hours 7P-7A, please log into the web site [email or web address] and access using universal [place] password for that web site. If you do not have the password, please call the hospital operator.  05/21/2023, 11:41 AM

## 2023-05-22 DIAGNOSIS — I96 Gangrene, not elsewhere classified: Secondary | ICD-10-CM | POA: Diagnosis not present

## 2023-05-22 DIAGNOSIS — I5040 Unspecified combined systolic (congestive) and diastolic (congestive) heart failure: Secondary | ICD-10-CM | POA: Diagnosis not present

## 2023-05-22 DIAGNOSIS — I10 Essential (primary) hypertension: Secondary | ICD-10-CM | POA: Diagnosis not present

## 2023-05-22 DIAGNOSIS — K047 Periapical abscess without sinus: Secondary | ICD-10-CM | POA: Diagnosis not present

## 2023-05-22 NOTE — TOC Progression Note (Signed)
Transition of Care [facility]) - Progression Note    Patient Details  Name: [name] MRN: [id number] Date of Birth: [birth year]  [facility] of [facility] CM/SW Contact  [name], [name] Phone Number: 05/22/2023, 10:38 AM  Clinical Narrative:    CSW spoke with patient while he was off the unit with [facility] from the [place] [facility] [place]. Patient did not have questions or concerns to address at this time.  CSW will continue following for discharge planning purposes.   Expected Discharge Plan: Home/Self Care Barriers to Discharge: Homeless with medical needs  Expected Discharge Plan and Services   Discharge Planning Services: CM Consult Post Acute Care Choice: Home Health Living arrangements for the past 2 months: Homeless                     Social Determinants of Health (SDOH) Interventions SDOH Screenings   Food Insecurity: Food Insecurity Present (11/24/2022)  Housing: Medium Risk (12/07/2022)  Transportation Needs: Unmet Transportation Needs (12/07/2022)  Utilities: Not At Risk (12/07/2022)  Alcohol Screen: Medium Risk (06/24/2019)  Depression (PHQ2-9): Medium Risk (02/10/2021)  Tobacco Use: High Risk (03/26/2023)    Readmission Risk Interventions    12/09/2022    9:02 AM 12/01/2022    3:56 PM 08/18/2022    9:00 AM  Readmission Risk Prevention Plan  Transportation Screening Complete Complete Complete  Medication Review [employer]) Complete Complete Complete  PCP or Specialist appointment within 3-5 days of discharge Complete Complete Complete  HRI or Home Care Consult Complete Complete Complete  SW Recovery Care/Counseling Consult Complete Complete Complete  Palliative Care Screening Not Applicable Not Applicable Not Applicable  Skilled Nursing Facility Not Applicable Not Applicable Not Applicable  SNF Comments  n/a

## 2023-05-22 NOTE — Plan of Care (Signed)
  Problem: Pain Managment: Goal: General experience of comfort will improve Outcome: Progressing   Problem: Skin Integrity: Goal: Risk for impaired skin integrity will decrease Outcome: Progressing   Problem: Education: Goal: Knowledge of General Education information will improve Description: Including pain rating scale, medication(s)/side effects and non-pharmacologic comfort measures Outcome: Progressing   Problem: Clinical Measurements: Goal: Diagnostic test results will improve Outcome: Progressing

## 2023-05-22 NOTE — Progress Notes (Signed)
PROGRESS NOTE    [name]  ZOX:[id number] DOB: Dec 26, 1965 DOA: 12/07/2022 PCP: [name] Sharps, NP    Chief Complaint  Patient presents with   Leg Swelling   Leg Pain    Brief Narrative:  [name] is a 57 y.o. male with a history of heart failure, CAD, right BKA, COPD, hyperlipidemia, hyperlipidemia, polyneuropathy, schizoaffective/bipolar disorder, was admitted to the hospital with left lower extremity cellulitis and gangrene.   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       During hospitalization, patient was also noted to have maxillary abscess and periapical abscess and underwent oral surgery with I&D and of anterior maxillary abscess and surgical extraction of multiple teeth in alveoloplasty. Currently stable.   Hospital stay prolonged due to difficulty in placement due to homelessness status status post left BKA and right AKA.   [facility],  have offered to provide a prosthesis for LLE (the BKA)    The patient scheduled an appointment Monday (June 3rd, 2024) at noon to meet up with patient and the Prosthetist to cast the L residual limb and make a plan and patient to receive a prosthesis for his left leg pro bono.   **Patient met with the Prosthetist for his Left Leg who brought initial test socket and will bring another test socket this week (05/18/23)and the patient is to wear the shrinker at all times except when bathing and can wear the shrinker with 2 layers to above his knee or with 1 layer above the knee in 3 to 4 layers more distally to apply more pressure for distal limb shaping.  Per the physical therapist he is to wear the liner during waking hours with initially starting 2 to 3 hours aware and then adding half an hour of wear each day until the patient gets used to wearing the liner.   Still has a difficult disposition given housing issue but CSW is involved.    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 -Patient seen in consultation by orthopedics underwent left BKA 12/10/2022. -Patient with prior history of right BKA, wheelchair-bound. -Patient noted to have been stable and awaiting disposition. -Patient on Lasix.  Edema electrolytes were checked potassium was low and repleted. -Repeat labs obtained today with stable electrolytes. -Per physical therapy patient to get a prosthesis for his left side pro bono next week and prosthesis consult placed. -Plan was for patient to meet with prosthelist June 3 at noon to cast for left lower extremity mold. -PT assisting in donning left lower extremity shrinker and getting worked up for prosthesis. -PT also recommending possible prosthesis for right leg and recommended getting an x-ray to evaluate his anatomy which showed interval right lower extremity amputation at the knee joint.  Hip joint alignment maintained without dislocation.  Moderate degenerative changes of the right hip joint.  No erosion or periosteal elevation.  Soft tissue thickening at the distal stump.  No soft  tissue gas. -PT following.  2.  Anterior maxillary abscess -Patient seen in consultation by oral surgeon, Dr. [name]. -Patient is status post I&D of anterior maxillary abscess, surgical extraction of teeth 4,5,6 ,11,12, 13, 20, 21, 22, 27, 28, 29. -Simple extraction teeth # 7, 8, 9, 10, 23, 24, 25, 26. -Status post angioplasty x 4 quadrants on  03/25/2023 per Dr. [name]. -Status post 7 days of Augmentin. -Patient currently on a regular diet. Continue oral hygiene and continue benzocaine mucosal gel for the mouth and throat 4 times daily as needed for mild pain.  3.  Unilateral hearing loss in the send no cerumen impaction -Otoscopic exam revealed severely impacted ear. -Continue Debrox to both ears. -Earwax removal order placed. -Prior physician discussed case with Dr. Jenne Pane ENT who recommended that patient benefit from outpatient evaluation given that they have all the tools in the office. -ENT also stated may be able to get an audiogram but doubts that -ENT unfortunately states cannot do anything in-house given lack of equipment and cannot undergo microscopic examination while hospitalized. -ENT, Dr. Jenne Pane recommended continued flushing of the ears and continue Debrox and adding Ciprodex if necessary given that he feels that will take time for earwax to be removed.  4.  Chronic combined systolic diastolic CHF -Compensated. -2D echo done 08/2022 with a EF of 40% with global hypokinesis. -Continue Coreg, Imdur, hydralazine, Lasix, Norvasc.    5.  Hypertension -Controlled on current regimen of Coreg, Norvasc, Lasix, Imdur, hydralazine.    6.  COPD -Stable. -Xopenex nebs as needed.  7.  Anxiety -Stable. -Was on hydroxyzine as needed.  8.  GERD -Patient noted to have refused PPI.   9.  Constipation -Continue current bowel regimen of MiraLAX and Dulcolax as needed.   10.  Chronic pain -Continue current pain regimen of MS Contin 30 mg twice daily, oxycodone 10 mg every 4 hours as needed severe pain, tramadol 50 mg every 12 hours as needed moderate pain, gabapentin 900 mg 3 times daily.  11.  Shoulder pain -Continue lidocaine patch and camphor topical.  12.  Schizoaffective disorder -Continue Abilify, Lexapro.   -Patient was assessed by psychiatry during this hospitalization.    13.  Obesity -BMI 29.3 kg/m. -Lifestyle  modification -Outpatient follow-up.   DVT prophylaxis: Lovenox Code Status: Full Family Communication: Updated patient.  No family at bedside. Disposition: Unsafe disposition.  Patient with lack of housing.  TOC assisting.  Status is: Inpatient Remains inpatient appropriate because: Homelessness/unsafe disposition   Consultants:  Wound care RN Ria Bush 12/07/2022 Orthopedics: Dr. Lajoyce Corners 12/09/2022 Psychiatry: Dr. Viviano Simas 02/26/2023 Oral surgeon: Dr. [name] 03/20/2023  Procedures:  As noted above.  Antimicrobials:  Anti-infectives (From admission, onward)    Start     Dose/Rate Route Frequency Ordered Stop   03/19/23 0900  amoxicillin-clavulanate (AUGMENTIN) 875-125 MG per tablet 1 tablet        1 tablet Oral Every 12 hours 03/19/23 0809 04/01/23 2118   03/18/23 2000  Ampicillin-Sulbactam (UNASYN) 3 g in sodium chloride 0.9 % 100 mL IVPB  Status:  Discontinued        3 g 200 mL/hr over 30 Minutes Intravenous Every 6 hours 03/18/23 1939 03/19/23 0809   03/18/23 1645  Ampicillin-Sulbactam (UNASYN) 3 g in sodium chloride 0.9 % 100 mL IVPB  Status:  Discontinued        3 g 200 mL/hr over 30 Minutes Intravenous Every 6 hours 03/18/23 1555 03/18/23 1939   12/26/22 2200  doxycycline (VIBRA-TABS) tablet 100  mg        100 mg Oral Every 12 hours 12/26/22 1307 01/08/23 2126   12/24/22 1000  doxycycline (VIBRA-TABS) tablet 100 mg  Status:  Discontinued        100 mg Oral Every 12 hours 12/24/22 0715 12/26/22 1307   12/10/22 1830  ceFAZolin (ANCEF) IVPB 2g/100 mL premix  Status:  Discontinued        2 g 200 mL/hr over 30 Minutes Intravenous Every 8 hours 12/10/22 1731 12/10/22 1740   12/10/22 1330  ceFAZolin (ANCEF) IVPB 2g/100 mL premix        2 g 200 mL/hr over 30 Minutes Intravenous On call to O.R. 12/10/22 0959 12/10/22 1534   12/08/22 0530  vancomycin (VANCOREADY) IVPB 1500 mg/300 mL        1,500 mg 150 mL/hr over 120 Minutes Intravenous Every 12 hours 12/07/22 1741 12/11/22 0955    12/08/22 0345  metroNIDAZOLE (FLAGYL) IVPB 500 mg        500 mg 100 mL/hr over 60 Minutes Intravenous Every 12 hours 12/07/22 1742 12/12/22 0344   12/07/22 2330  ceFEPIme (MAXIPIME) 2 g in sodium chloride 0.9 % 100 mL IVPB        2 g 200 mL/hr over 30 Minutes Intravenous Every 8 hours 12/07/22 1741 12/11/22 1529   12/07/22 2200  metroNIDAZOLE (FLAGYL) IVPB 500 mg  Status:  Discontinued        500 mg 100 mL/hr over 60 Minutes Intravenous Every 12 hours 12/07/22 2048 12/07/22 2056   12/07/22 1530  ceFEPIme (MAXIPIME) 2 g in sodium chloride 0.9 % 100 mL IVPB        2 g 200 mL/hr over 30 Minutes Intravenous  Once 12/07/22 1516 12/07/22 1734   12/07/22 1530  vancomycin (VANCOREADY) IVPB 1500 mg/300 mL        1,500 mg 150 mL/hr over 120 Minutes Intravenous  Once 12/07/22 1516 12/09/22 0333   12/07/22 1515  metroNIDAZOLE (FLAGYL) IVPB 500 mg        500 mg 100 mL/hr over 60 Minutes Intravenous  Once 12/07/22 1506 12/07/22 1734   12/07/22 1500  ceFAZolin (ANCEF) IVPB 1 g/50 mL premix  Status:  Discontinued        1 g 100 mL/hr over 30 Minutes Intravenous  Once 12/07/22 1452 12/07/22 1513         Subjective: Sitting up in wheelchair in room.  Denies any chest pain or shortness of breath.  States he is doing fine.  Patient states still with hearing loss/decreased hearing in the right ear.  Objective: Vitals:   05/21/23 0734 05/21/23 1937 05/22/23 0400 05/22/23 0808  BP: (!) 154/79 124/81 124/78 128/69  Pulse: 66 74 64 61  Resp: 18 20 18 18   Temp: 97.8 F (36.6 C) 97.8 F (36.6 C) 98.4 F (36.9 C) 98 F (36.7 C)  TempSrc: Oral Oral Oral Oral  SpO2: 100% 99% 95% 100%  Weight:      Height:        Intake/Output Summary (Last 24 hours) at 05/22/2023 1216 Last data filed at 05/22/2023 0405 Gross per 24 hour  Intake 237 ml  Output 0 ml  Net 237 ml    Filed Weights   05/09/23 0500 05/11/23 0428 05/12/23 0633  Weight: 90.7 kg 90 kg 90 kg    Examination:  General exam:  NAD Respiratory system: Lungs clear to auscultation bilaterally.  No wheezes, no crackles, no rhonchi.  Fair air movement.  Speaking in full  sentences.   Cardiovascular system: Regular rate rhythm no murmurs rubs or gallops.  No JVD.  No lower extremity edema.  Gastrointestinal system: Abdomen is soft, nontender, nondistended, positive bowel sounds.  No rebound.  No guarding.  Central nervous system: Alert and oriented. No focal neurological deficits. Extremities: Status post left BKA and right AKA.  Skin: No rashes, lesions or ulcers Psychiatry: Judgement and insight appear normal. Mood & affect appropriate.     Data Reviewed: I have personally reviewed following labs and imaging studies  CBC: Recent Labs  Lab 05/20/23 1216  WBC 5.9  HGB 14.1  HCT 41.7  MCV 90.1  PLT 292     Basic Metabolic Panel: Recent Labs  Lab 05/20/23 1216  NA 139  K 3.7  CL 105  CO2 25  GLUCOSE 155*  BUN 15  CREATININE 0.98  CALCIUM 9.2  MG 2.0     GFR: Estimated Creatinine Clearance: 94.4 mL/[name] (by C-G formula based on SCr of 0.98 mg/dL).  Liver Function Tests: No results for input(s): "AST", "ALT", "ALKPHOS", "BILITOT", "PROT", "ALBUMIN" in the last 168 hours.  CBG: No results for input(s): "GLUCAP" in the last 168 hours.   No results found for this or any previous visit (from the past 240 hour(s)).       Radiology Studies: No results found.      Scheduled Meds:  amLODipine  10 mg Oral Daily   ARIPiprazole  5 mg Oral Daily   vitamin C  1,000 mg Oral Daily   carbamide peroxide  5 drop Both EARS BID   carvedilol  6.25 mg Oral BID WC   chlorhexidine  15 mL Mouth/Throat TID   enoxaparin (LOVENOX) injection  40 mg Subcutaneous Q24H   escitalopram  5 mg Oral Daily   feeding supplement  237 mL Oral BID BM   furosemide  20 mg Oral Daily   gabapentin  900 mg Oral TID   hydrALAZINE  25 mg Oral Q8H   isosorbide mononitrate  15 mg Oral Daily   lidocaine  1 patch Transdermal  Q24H   melatonin  3 mg Oral QHS   morphine  30 mg Oral Q12H   multivitamin with minerals  1 tablet Oral Daily   nystatin  5 mL Oral QID   Continuous Infusions:   LOS: 166 days    Time spent: 35 minutes    Ramiro Harvest, MD [facility]   To contact the attending provider between 7A-7P or the covering provider during after hours 7P-7A, please log into the web site [email or web address] and access using universal [place] password for that web site. If you do not have the password, please call the hospital operator.  05/22/2023, 12:16 PM

## 2023-05-23 DIAGNOSIS — K047 Periapical abscess without sinus: Secondary | ICD-10-CM | POA: Diagnosis not present

## 2023-05-23 DIAGNOSIS — I10 Essential (primary) hypertension: Secondary | ICD-10-CM | POA: Diagnosis not present

## 2023-05-23 DIAGNOSIS — I5040 Unspecified combined systolic (congestive) and diastolic (congestive) heart failure: Secondary | ICD-10-CM | POA: Diagnosis not present

## 2023-05-23 DIAGNOSIS — I96 Gangrene, not elsewhere classified: Secondary | ICD-10-CM | POA: Diagnosis not present

## 2023-05-23 NOTE — Plan of Care (Signed)
  Problem: Pain Managment: Goal: General experience of comfort will improve Outcome: Adequate for Discharge   Problem: Clinical Measurements: Goal: Will remain free from infection Outcome: Adequate for Discharge   Problem: Activity: Goal: Risk for activity intolerance will decrease Outcome: Adequate for Discharge   Problem: Nutrition: Goal: Adequate nutrition will be maintained Outcome: Adequate for Discharge

## 2023-05-23 NOTE — Progress Notes (Signed)
Mobility Specialist Progress Note   05/23/23 1438  Mobility  Activity Transferred from bed to chair;Transferred from chair to bed (UE AROM & Core exercises)  Level of Assistance Standby assist, set-up cues, supervision of patient - no hands on  Assistive Device Wheelchair  Range of Motion/Exercises Active;All extremities  Activity Response Tolerated well   Patient received in [facility], eager to participate. Completed A&P transfer to mat table and completed a multiple core exercises and UE stretches/AROM. Tolerated without complaint or incident. Returned to [facility] and was left with all needs met.  [place] Liahna Brickner, BS EXP Mobility Specialist Please contact via SecureChat or Rehab office at [phone]

## 2023-05-23 NOTE — Progress Notes (Signed)
PROGRESS NOTE    [name]  ZOX:[id number] DOB: 01/08/1966 DOA: 12/07/2022 PCP: [name], NP    Chief Complaint  Patient presents with   Leg Swelling   Leg Pain    Brief Narrative:  [name] is a 57 y.o. male with a history of heart failure, CAD, right BKA, COPD, hyperlipidemia, hyperlipidemia, polyneuropathy, schizoaffective/bipolar disorder, was admitted to the hospital with left lower extremity cellulitis and gangrene.   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       During hospitalization, patient was also noted to have maxillary abscess and periapical abscess and underwent oral surgery with I&D and of anterior maxillary abscess and surgical extraction of multiple teeth in alveoloplasty. Currently stable.   Hospital stay prolonged due to difficulty in placement due to homelessness status status post left BKA and right AKA.   [facility],  have offered to provide a prosthesis for LLE (the BKA)    The patient scheduled an appointment Monday (June 3rd, 2024) at noon to meet up with patient and the Prosthetist to cast the L residual limb and make a plan and patient to receive a prosthesis for his left leg pro bono.   **Patient met with the Prosthetist for his Left Leg who brought initial test socket and will bring another test socket this week (05/18/23)and the patient is to wear the shrinker at all times except when bathing and can wear the shrinker with 2 layers to above his knee or with 1 layer above the knee in 3 to 4 layers more distally to apply more pressure for distal limb shaping.  Per the physical therapist he is to wear the liner during waking hours with initially starting 2 to 3 hours aware and then adding half an hour of wear each day until the patient gets used to wearing the liner.   Still has a difficult disposition given housing issue but CSW is involved.    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 -Patient seen in consultation by orthopedics underwent left BKA 12/10/2022. -Patient with prior history of right BKA, wheelchair-bound. -Patient noted to have been stable and awaiting disposition. -Patient on Lasix.  Edema electrolytes were checked potassium was low and repleted. -Repeat labs obtained today with stable electrolytes. -Per physical therapy patient to get a prosthesis for his left side pro bono next week and prosthesis consult placed. -Plan was for patient to meet with prosthelist June 3 at noon to cast for left lower extremity mold. -PT assisting in donning left lower extremity shrinker and getting worked up for prosthesis. -PT also recommending possible prosthesis for right leg and recommended getting an x-ray to evaluate his anatomy which showed interval right lower extremity amputation at the knee joint.  Hip joint alignment maintained without dislocation.  Moderate degenerative changes of the right hip joint.  No erosion or periosteal elevation.  Soft tissue thickening at the distal stump.  No soft  tissue gas. -PT following.  2.  Anterior maxillary abscess -Patient seen in consultation by oral surgeon, Dr. [name]. -Patient is status post I&D of anterior maxillary abscess, surgical extraction of teeth 4,5,6 ,11,12, 13, 20, 21, 22, 27, 28, 29. -Simple extraction teeth # 7, 8, 9, 10, 23, 24, 25, 26. -Status post angioplasty x 4 quadrants on  03/25/2023 per Dr. [name]. -Status post 7 days of Augmentin. -Patient currently on a regular diet. Continue oral hygiene and continue benzocaine mucosal gel for the mouth and throat 4 times daily as needed for mild pain.  3.  Unilateral hearing loss in the send no cerumen impaction -Otoscopic exam revealed severely impacted ear. -Continue Debrox to both ears. -Earwax removal order placed. -Prior physician discussed case with Dr. Jenne Pane ENT who recommended that patient benefit from outpatient evaluation given that they have all the tools in the office. -ENT also stated may be able to get an audiogram but doubts that -ENT unfortunately states cannot do anything in-house given lack of equipment and cannot undergo microscopic examination while hospitalized. -ENT, Dr. Jenne Pane recommended continued flushing of the ears and continue Debrox and adding Ciprodex if necessary given that he feels that will take time for earwax to be removed.  4.  Chronic combined systolic diastolic CHF -Compensated. -2D echo done 08/2022 with a EF of 40% with global hypokinesis. -Continue Coreg, Imdur, hydralazine, Lasix, Norvasc.    5.  Hypertension -Continue Coreg, Norvasc, Lasix, Imdur, hydralazine.      6.  COPD -Stable. -Xopenex nebs as needed.  7.  Anxiety -Stable. -Was on hydroxyzine as needed.  8.  GERD -Patient noted to have refused PPI.   9.  Constipation -Continue bowel regimen of MiraLAX and Dulcolax as needed.    10.  Chronic pain -Continue current pain regimen of MS Contin 30 mg twice daily, oxycodone 20 mg every 4 hours as needed severe pain, tramadol 50 mg every 12 hours as needed moderate pain, gabapentin 900 mg 3 times daily.  11.  Shoulder pain -Continue lidocaine patch and camphor topical.  12.  Schizoaffective disorder -Continue Lexapro, Abilify.   -Patient was assessed by psychiatry during this hospitalization.    13.  Obesity -BMI 29.3 kg/m. -Lifestyle modification -Outpatient  follow-up.   DVT prophylaxis: Lovenox Code Status: Full Family Communication: Updated patient.  No family at bedside. Disposition: Unsafe disposition.  Patient with lack of housing.  TOC assisting.  Status is: Inpatient Remains inpatient appropriate because: Homelessness/unsafe disposition   Consultants:  Wound care RN Ria Bush 12/07/2022 Orthopedics: Dr. Lajoyce Corners 12/09/2022 Psychiatry: Dr. Viviano Simas 02/26/2023 Oral surgeon: Dr. [name] 03/20/2023  Procedures:  As noted above.  Antimicrobials:  Anti-infectives (From admission, onward)    Start     Dose/Rate Route Frequency Ordered Stop   03/19/23 0900  amoxicillin-clavulanate (AUGMENTIN) 875-125 MG per tablet 1 tablet        1 tablet Oral Every 12 hours 03/19/23 0809 04/01/23 2118   03/18/23 2000  Ampicillin-Sulbactam (UNASYN) 3 g in sodium chloride 0.9 % 100 mL IVPB  Status:  Discontinued        3 g 200 mL/hr over 30 Minutes Intravenous Every 6 hours 03/18/23 1939 03/19/23 0809   03/18/23 1645  Ampicillin-Sulbactam (UNASYN) 3 g in sodium chloride 0.9 % 100 mL IVPB  Status:  Discontinued        3 g 200 mL/hr over 30 Minutes Intravenous Every 6 hours 03/18/23 1555 03/18/23 1939   12/26/22 2200  doxycycline (VIBRA-TABS) tablet 100 mg  100 mg Oral Every 12 hours 12/26/22 1307 01/08/23 2126   12/24/22 1000  doxycycline (VIBRA-TABS) tablet 100 mg  Status:  Discontinued        100 mg Oral Every 12 hours 12/24/22 0715 12/26/22 1307   12/10/22 1830  ceFAZolin (ANCEF) IVPB 2g/100 mL premix  Status:  Discontinued        2 g 200 mL/hr over 30 Minutes Intravenous Every 8 hours 12/10/22 1731 12/10/22 1740   12/10/22 1330  ceFAZolin (ANCEF) IVPB 2g/100 mL premix        2 g 200 mL/hr over 30 Minutes Intravenous On call to O.R. 12/10/22 0959 12/10/22 1534   12/08/22 0530  vancomycin (VANCOREADY) IVPB 1500 mg/300 mL        1,500 mg 150 mL/hr over 120 Minutes Intravenous Every 12 hours 12/07/22 1741 12/11/22 0955   12/08/22 0345  metroNIDAZOLE  (FLAGYL) IVPB 500 mg        500 mg 100 mL/hr over 60 Minutes Intravenous Every 12 hours 12/07/22 1742 12/12/22 0344   12/07/22 2330  ceFEPIme (MAXIPIME) 2 g in sodium chloride 0.9 % 100 mL IVPB        2 g 200 mL/hr over 30 Minutes Intravenous Every 8 hours 12/07/22 1741 12/11/22 1529   12/07/22 2200  metroNIDAZOLE (FLAGYL) IVPB 500 mg  Status:  Discontinued        500 mg 100 mL/hr over 60 Minutes Intravenous Every 12 hours 12/07/22 2048 12/07/22 2056   12/07/22 1530  ceFEPIme (MAXIPIME) 2 g in sodium chloride 0.9 % 100 mL IVPB        2 g 200 mL/hr over 30 Minutes Intravenous  Once 12/07/22 1516 12/07/22 1734   12/07/22 1530  vancomycin (VANCOREADY) IVPB 1500 mg/300 mL        1,500 mg 150 mL/hr over 120 Minutes Intravenous  Once 12/07/22 1516 12/09/22 0333   12/07/22 1515  metroNIDAZOLE (FLAGYL) IVPB 500 mg        500 mg 100 mL/hr over 60 Minutes Intravenous  Once 12/07/22 1506 12/07/22 1734   12/07/22 1500  ceFAZolin (ANCEF) IVPB 1 g/50 mL premix  Status:  Discontinued        1 g 100 mL/hr over 30 Minutes Intravenous  Once 12/07/22 1452 12/07/22 1513         Subjective: In wheelchair in room.  No chest pain.  No shortness of breath.  No abdominal pain.  States oxycodone works better than the morphine and was asking whether interval could be decreased.   Objective: Vitals:   05/22/23 0400 05/22/23 0808 05/22/23 2029 05/23/23 0652  BP: 124/78 128/69 (!) 127/93 109/63  Pulse: 64 61 73 71  Resp: 18 18 16    Temp: 98.4 F (36.9 C) 98 F (36.7 C) 98.6 F (37 C)   TempSrc: Oral Oral    SpO2: 95% 100% 97%   Weight:      Height:       No intake or output data in the 24 hours ending 05/23/23 1250  Filed Weights   05/09/23 0500 05/11/23 0428 05/12/23 0633  Weight: 90.7 kg 90 kg 90 kg    Examination:  General exam: NAD Respiratory system: CTAB.  No wheezes, no crackles, no rhonchi.  Fair air movement.  Speaking in full sentences.  Cardiovascular system: RRR no murmurs rubs  or gallops.  No JVD.  No lower extremity edema.  Gastrointestinal system: Abdomen is soft, nontender, nondistended, positive bowel sounds.  No rebound.  No guarding.  Central nervous system: Alert and oriented. No focal neurological deficits. Extremities: Status post left BKA and right AKA.  Skin: No rashes, lesions or ulcers Psychiatry: Judgement and insight appear normal. Mood & affect appropriate.     Data Reviewed: I have personally reviewed following labs and imaging studies  CBC: Recent Labs  Lab 05/20/23 1216  WBC 5.9  HGB 14.1  HCT 41.7  MCV 90.1  PLT 292     Basic Metabolic Panel: Recent Labs  Lab 05/20/23 1216  NA 139  K 3.7  CL 105  CO2 25  GLUCOSE 155*  BUN 15  CREATININE 0.98  CALCIUM 9.2  MG 2.0     GFR: Estimated Creatinine Clearance: 94.4 mL/min (by C-G formula based on SCr of 0.98 mg/dL).  Liver Function Tests: No results for input(s): "AST", "ALT", "ALKPHOS", "BILITOT", "PROT", "ALBUMIN" in the last 168 hours.  CBG: No results for input(s): "GLUCAP" in the last 168 hours.   No results found for this or any previous visit (from the past 240 hour(s)).       Radiology Studies: No results found.      Scheduled Meds:  amLODipine  10 mg Oral Daily   ARIPiprazole  5 mg Oral Daily   vitamin C  1,000 mg Oral Daily   carbamide peroxide  5 drop Both EARS BID   carvedilol  6.25 mg Oral BID WC   chlorhexidine  15 mL Mouth/Throat TID   enoxaparin (LOVENOX) injection  40 mg Subcutaneous Q24H   escitalopram  5 mg Oral Daily   feeding supplement  237 mL Oral BID BM   furosemide  20 mg Oral Daily   gabapentin  900 mg Oral TID   hydrALAZINE  25 mg Oral Q8H   isosorbide mononitrate  15 mg Oral Daily   lidocaine  1 patch Transdermal Q24H   melatonin  3 mg Oral QHS   morphine  30 mg Oral Q12H   multivitamin with minerals  1 tablet Oral Daily   nystatin  5 mL Oral QID   Continuous Infusions:   LOS: 167 days    Time spent: 35  minutes    Ramiro Harvest, MD [facility]   To contact the attending provider between 7A-7P or the covering provider during after hours 7P-7A, please log into the web site [email or web address] and access using universal [place] password for that web site. If you do not have the password, please call the hospital operator.  05/23/2023, 12:50 PM

## 2023-05-24 DIAGNOSIS — I96 Gangrene, not elsewhere classified: Secondary | ICD-10-CM | POA: Diagnosis not present

## 2023-05-24 DIAGNOSIS — K047 Periapical abscess without sinus: Secondary | ICD-10-CM | POA: Diagnosis not present

## 2023-05-24 DIAGNOSIS — I5040 Unspecified combined systolic (congestive) and diastolic (congestive) heart failure: Secondary | ICD-10-CM | POA: Diagnosis not present

## 2023-05-24 DIAGNOSIS — I10 Essential (primary) hypertension: Secondary | ICD-10-CM | POA: Diagnosis not present

## 2023-05-24 MED ORDER — OXYCODONE HCL ER 10 MG PO T12A
30.0000 mg | EXTENDED_RELEASE_TABLET | Freq: Two times a day (BID) | ORAL | Status: DC
  Administered 2023-05-24 – 2023-06-06 (×27): 30 mg via ORAL
  Filled 2023-05-24 (×27): qty 3

## 2023-05-24 NOTE — Progress Notes (Signed)
PROGRESS NOTE    [name]  ZOX:[id number] DOB: Dec 26, 1965 DOA: 12/07/2022 PCP: [name], NP    Chief Complaint  Patient presents with   Leg Swelling   Leg Pain    Brief Narrative:  [name] is a 57 y.o. male with a history of heart failure, CAD, right BKA, COPD, hyperlipidemia, hyperlipidemia, polyneuropathy, schizoaffective/bipolar disorder, was admitted to the hospital with left lower extremity cellulitis and gangrene.   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       During hospitalization, patient was also noted to have maxillary abscess and periapical abscess and underwent oral surgery with I&D and of anterior maxillary abscess and surgical extraction of multiple teeth in alveoloplasty. Currently stable.   Hospital stay prolonged due to difficulty in placement due to homelessness status status post left BKA and right AKA.   [facility],  have offered to provide a prosthesis for LLE (the BKA)    The patient scheduled an appointment Monday (June 3rd, 2024) at noon to meet up with patient and the Prosthetist to cast the L residual limb and make a plan and patient to receive a prosthesis for his left leg pro bono.   **Patient met with the Prosthetist for his Left Leg who brought initial test socket and will bring another test socket this week (05/18/23)and the patient is to wear the shrinker at all times except when bathing and can wear the shrinker with 2 layers to above his knee or with 1 layer above the knee in 3 to 4 layers more distally to apply more pressure for distal limb shaping.  Per the physical therapist he is to wear the liner during waking hours with initially starting 2 to 3 hours aware and then adding half an hour of wear each day until the patient gets used to wearing the liner.   Still has a difficult disposition given housing issue but CSW is involved.    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 -Patient seen in consultation by orthopedics underwent left BKA 12/10/2022. -Patient with prior history of right BKA, wheelchair-bound. -Patient noted to have been stable and awaiting disposition. -Patient on Lasix.  Edema electrolytes were checked potassium was low and repleted. -Repeat labs obtained today with stable electrolytes. -Per physical therapy patient to get a prosthesis for his left side pro bono next week and prosthesis consult placed. -Plan was for patient to meet with prosthelist June 3 at noon to cast for left lower extremity mold. -PT assisting in donning left lower extremity shrinker and getting worked up for prosthesis. -PT also recommending possible prosthesis for right leg and recommended getting an x-ray to evaluate his anatomy which showed interval right lower extremity amputation at the knee joint.  Hip joint alignment maintained without dislocation.  Moderate degenerative changes of the right hip joint.  No erosion or periosteal elevation.  Soft tissue thickening at the distal stump.  No soft  tissue gas. -PT following.  2.  Anterior maxillary abscess -Patient seen in consultation by oral surgeon, Dr. [name]. -Patient is status post I&D of anterior maxillary abscess, surgical extraction of teeth 4,5,6 ,11,12, 13, 20, 21, 22, 27, 28, 29. -Simple extraction teeth # 7, 8, 9, 10, 23, 24, 25, 26. -Status post angioplasty x 4 quadrants on  03/25/2023 per Dr. [name]. -Status post 7 days of Augmentin. -Patient currently on a regular diet. Continue oral hygiene and continue benzocaine mucosal gel for the mouth and throat 4 times daily as needed for mild pain.  3.  Unilateral hearing loss in the setting of cerumen impaction -Otoscopic exam revealed severely impacted ear. -Continue Debrox to both ears. -Earwax removal order placed. -Prior physician discussed case with Dr. Jenne Pane ENT who recommended that patient benefit from outpatient evaluation given that they have all the tools in the office. -ENT also stated may be able to get an audiogram but doubts that -ENT unfortunately states cannot do anything in-house given lack of equipment and cannot undergo microscopic examination while hospitalized. -ENT, Dr. Jenne Pane recommended continued flushing of the ears and continue Debrox and adding Ciprodex if necessary given that he feels that will take time for earwax to be removed.  4.  Chronic combined systolic diastolic CHF -Compensated. -2D echo done 08/2022 with a EF of 40% with global hypokinesis. -Continue Coreg, Imdur, hydralazine, Lasix, Norvasc.    5.  Hypertension -Continue Norvasc, Lasix, Coreg, Imdur, hydralazine.   6.  COPD -Stable. -Xopenex nebs as needed.  7.  Anxiety -Stable. -Was on hydroxyzine as needed.  8.  GERD -Patient noted to have refused PPI.   9.  Constipation -Continue bowel regimen of MiraLAX and Dulcolax as needed.     10.  Chronic pain -Patient currently on MS Contin 30 mg twice daily, oxycodone 20 mg every 4 hours as needed severe pain, tramadol 50 mg every 12 hours as needed moderate pain, gabapentin 900 mg 3 times daily. -Patient asking whether oxycodone interval may be decreased. -Will continue current oxycodone 20 mg every 4 hours as needed breakthrough for severe pain.  Continue tramadol and gabapentin. -Change MS Contin 30 mg twice daily to OxyContin 30 mg twice daily.  11.  Shoulder  pain -Continue lidocaine patch and camphor topical.  12.  Schizoaffective disorder -Continue Abilify, Lexapro.  -Patient was assessed by psychiatry during this hospitalization.    13.  Obesity -BMI 29.3 kg/m. -Lifestyle modification -Outpatient follow-up.   DVT prophylaxis: Lovenox Code Status: Full Family Communication: Updated patient.  No family at bedside. Disposition: Unsafe disposition.  Patient with lack of housing.  TOC assisting.  Status is: Inpatient Remains inpatient appropriate because: Homelessness/unsafe disposition   Consultants:  Wound care RN Ria Bush 12/07/2022 Orthopedics: Dr. Lajoyce Corners 12/09/2022 Psychiatry: Dr. Viviano Simas 02/26/2023 Oral surgeon: Dr. [name] 03/20/2023  Procedures:  As noted above.  Antimicrobials:  Anti-infectives (From admission, onward)    Start     Dose/Rate Route Frequency Ordered Stop   03/19/23 0900  amoxicillin-clavulanate (AUGMENTIN) 875-125 MG per tablet 1 tablet        1 tablet Oral Every 12 hours 03/19/23 0809 04/01/23 2118   03/18/23 2000  Ampicillin-Sulbactam (UNASYN) 3 g in sodium chloride 0.9 % 100 mL IVPB  Status:  Discontinued        3 g 200 mL/hr over 30 Minutes Intravenous Every 6 hours 03/18/23 1939 03/19/23 0809   03/18/23 1645  Ampicillin-Sulbactam (UNASYN) 3 g in sodium chloride 0.9 % 100 mL IVPB  Status:  Discontinued        3 g 200 mL/hr over 30 Minutes Intravenous Every 6 hours 03/18/23 1555 03/18/23 1939   12/26/22 2200  doxycycline (VIBRA-TABS) tablet 100 mg        100 mg Oral Every 12 hours 12/26/22 1307 01/08/23 2126   12/24/22 1000  doxycycline (VIBRA-TABS) tablet 100 mg  Status:  Discontinued        100 mg Oral Every 12 hours 12/24/22 0715 12/26/22 1307   12/10/22 1830  ceFAZolin (ANCEF) IVPB 2g/100 mL premix  Status:  Discontinued        2 g 200 mL/hr over 30 Minutes Intravenous Every 8 hours 12/10/22 1731 12/10/22 1740   12/10/22 1330  ceFAZolin (ANCEF) IVPB 2g/100 mL premix        2 g 200 mL/hr over 30  Minutes Intravenous On call to O.R. 12/10/22 0959 12/10/22 1534   12/08/22 0530  vancomycin (VANCOREADY) IVPB 1500 mg/300 mL        1,500 mg 150 mL/hr over 120 Minutes Intravenous Every 12 hours 12/07/22 1741 12/11/22 0955   12/08/22 0345  metroNIDAZOLE (FLAGYL) IVPB 500 mg        500 mg 100 mL/hr over 60 Minutes Intravenous Every 12 hours 12/07/22 1742 12/12/22 0344   12/07/22 2330  ceFEPIme (MAXIPIME) 2 g in sodium chloride 0.9 % 100 mL IVPB        2 g 200 mL/hr over 30 Minutes Intravenous Every 8 hours 12/07/22 1741 12/11/22 1529   12/07/22 2200  metroNIDAZOLE (FLAGYL) IVPB 500 mg  Status:  Discontinued        500 mg 100 mL/hr over 60 Minutes Intravenous Every 12 hours 12/07/22 2048 12/07/22 2056   12/07/22 1530  ceFEPIme (MAXIPIME) 2 g in sodium chloride 0.9 % 100 mL IVPB        2 g 200 mL/hr over 30 Minutes Intravenous  Once 12/07/22 1516 12/07/22 1734   12/07/22 1530  vancomycin (VANCOREADY) IVPB 1500 mg/300 mL        1,500 mg 150 mL/hr over 120 Minutes Intravenous  Once 12/07/22 1516 12/09/22 0333   12/07/22 1515  metroNIDAZOLE (FLAGYL) IVPB 500 mg        500 mg 100 mL/hr over 60 Minutes Intravenous  Once 12/07/22 1506 12/07/22 1734   12/07/22 1500  ceFAZolin (ANCEF) IVPB 1 g/50 mL premix  Status:  Discontinued        1 g 100 mL/hr over 30 Minutes Intravenous  Once 12/07/22 1452 12/07/22 1513         Subjective: Sleeping but easily arousable.  Denies any chest pain or shortness of breath.  No abdominal pain.  Asking whether his pain medication may be adjusted for better pain control.  Patient states still with difficulty hearing on the right.  Objective: Vitals:   05/22/23 2029 05/23/23 0652 05/23/23 1500 05/23/23 2112  BP: (!) 127/93 109/63 126/74 (!) 146/93  Pulse: 73 71 85   Resp: 16  18 16   Temp: 98.6 F (37 C)  98.6 F (37 C) 98.2 F (36.8 C)  TempSrc:   Oral Oral  SpO2: 97%  97% 93%  Weight:      Height:        Intake/Output Summary (Last 24 hours) at  05/24/2023 1417 Last data filed at 05/24/2023 0900 Gross per 24 hour  Intake 240 ml  Output 400 ml  Net -160 ml    Filed Weights   05/09/23 0500 05/11/23 0428 05/12/23  1610  Weight: 90.7 kg 90 kg 90 kg    Examination:  General exam: NAD Respiratory system: CTAB.  No wheezes, no crackles, no rhonchi.  Fair air movement.  Speaking in full sentences.  Cardiovascular system: Regular rate rhythm no murmurs rubs or gallops.  No JVD.  No lower extremity edema.  Gastrointestinal system: Abdomen is soft, nontender, nondistended, positive bowel sounds.  No rebound.  No guarding.   Central nervous system: Alert and oriented. No focal neurological deficits. Extremities: Status post left BKA and right AKA.  Skin: No rashes, lesions or ulcers Psychiatry: Judgement and insight appear normal. Mood & affect appropriate.     Data Reviewed: I have personally reviewed following labs and imaging studies  CBC: Recent Labs  Lab 05/20/23 1216  WBC 5.9  HGB 14.1  HCT 41.7  MCV 90.1  PLT 292     Basic Metabolic Panel: Recent Labs  Lab 05/20/23 1216  NA 139  K 3.7  CL 105  CO2 25  GLUCOSE 155*  BUN 15  CREATININE 0.98  CALCIUM 9.2  MG 2.0     GFR: Estimated Creatinine Clearance: 94.4 mL/[name] (by C-G formula based on SCr of 0.98 mg/dL).  Liver Function Tests: No results for input(s): "AST", "ALT", "ALKPHOS", "BILITOT", "PROT", "ALBUMIN" in the last 168 hours.  CBG: No results for input(s): "GLUCAP" in the last 168 hours.   No results found for this or any previous visit (from the past 240 hour(s)).       Radiology Studies: No results found.      Scheduled Meds:  amLODipine  10 mg Oral Daily   ARIPiprazole  5 mg Oral Daily   vitamin C  1,000 mg Oral Daily   carbamide peroxide  5 drop Both EARS BID   carvedilol  6.25 mg Oral BID WC   chlorhexidine  15 mL Mouth/Throat TID   enoxaparin (LOVENOX) injection  40 mg Subcutaneous Q24H   escitalopram  5 mg Oral Daily    feeding supplement  237 mL Oral BID BM   furosemide  20 mg Oral Daily   gabapentin  900 mg Oral TID   hydrALAZINE  25 mg Oral Q8H   isosorbide mononitrate  15 mg Oral Daily   lidocaine  1 patch Transdermal Q24H   melatonin  3 mg Oral QHS   morphine  30 mg Oral Q12H   multivitamin with minerals  1 tablet Oral Daily   nystatin  5 mL Oral QID   Continuous Infusions:   LOS: 168 days    Time spent: 35 minutes    Ramiro Harvest, MD [facility]   To contact the attending provider between 7A-7P or the covering provider during after hours 7P-7A, please log into the web site [email or web address] and access using universal [place] password for that web site. If you do not have the password, please call the hospital operator.  05/24/2023, 2:17 PM

## 2023-05-24 NOTE — Progress Notes (Signed)
Mobility Specialist Progress Note   05/24/23 1400  Mobility  Activity Ambulated independently in hallway  Level of Assistance Modified independent, requires aide device or extra time  Assistive Device Wheelchair  Distance Ambulated (ft) 550 ft  Range of Motion/Exercises Active;All extremities  Activity Response Tolerated well   Patient received in [facility] and agreeable to participate in mobility. Deferred exercises and opted to active rest, so propelled in hallway independently. Tolerated without complaint and completed with max effort. Was left in [facility] with all needs met.   [place] Erikka Follmer, BS EXP Mobility Specialist Please contact via SecureChat or Rehab office at [phone]

## 2023-05-25 DIAGNOSIS — I5040 Unspecified combined systolic (congestive) and diastolic (congestive) heart failure: Secondary | ICD-10-CM | POA: Diagnosis not present

## 2023-05-25 DIAGNOSIS — I96 Gangrene, not elsewhere classified: Secondary | ICD-10-CM | POA: Diagnosis not present

## 2023-05-25 DIAGNOSIS — I10 Essential (primary) hypertension: Secondary | ICD-10-CM | POA: Diagnosis not present

## 2023-05-25 DIAGNOSIS — K047 Periapical abscess without sinus: Secondary | ICD-10-CM | POA: Diagnosis not present

## 2023-05-25 NOTE — Progress Notes (Signed)
PROGRESS NOTE    [name]  ZOX:[id number] DOB: 03-26-1966 DOA: 12/07/2022 PCP: [name], NP    Chief Complaint  Patient presents with   Leg Swelling   Leg Pain    Brief Narrative:  [name] is a 57 y.o. male with a history of heart failure, CAD, right BKA, COPD, hyperlipidemia, hyperlipidemia, polyneuropathy, schizoaffective/bipolar disorder, was admitted to the hospital with left lower extremity cellulitis and gangrene.   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       During hospitalization, patient was also noted to have maxillary abscess and periapical abscess and underwent oral surgery with I&D and of anterior maxillary abscess and surgical extraction of multiple teeth in alveoloplasty. Currently stable.   Hospital stay prolonged due to difficulty in placement due to homelessness status status post left BKA and right AKA.   [facility],  have offered to provide a prosthesis for LLE (the BKA)    The patient scheduled an appointment Monday (June 3rd, 2024) at noon to meet up with patient and the Prosthetist to cast the L residual limb and make a plan and patient to receive a prosthesis for his left leg pro bono.   **Patient met with the Prosthetist for his Left Leg who brought initial test socket and will bring another test socket this week (05/18/23)and the patient is to wear the shrinker at all times except when bathing and can wear the shrinker with 2 layers to above his knee or with 1 layer above the knee in 3 to 4 layers more distally to apply more pressure for distal limb shaping.  Per the physical therapist he is to wear the liner during waking hours with initially starting 2 to 3 hours aware and then adding half an hour of wear each day until the patient gets used to wearing the liner.   Still has a difficult disposition given housing issue but CSW is involved.    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 -Patient seen in consultation by orthopedics underwent left BKA 12/10/2022. -Patient with prior history of right BKA, wheelchair-bound. -Patient noted to have been stable and awaiting disposition. -Patient on Lasix.  Edema electrolytes were checked potassium was low and repleted. -Repeat labs obtained today with stable electrolytes. -Per physical therapy patient to get a prosthesis for his left side pro bono tomorrow, 05/26/2023 prosthesis consult was placed.   -Plan was for patient to meet with prosthelist June 3 at noon to cast for left lower extremity mold. -PT assisting in donning left lower extremity shrinker and getting worked up for prosthesis. -PT also recommending possible prosthesis for right leg and recommended getting an x-ray to evaluate his anatomy which showed interval right lower extremity amputation at the knee joint.  Hip joint alignment maintained without dislocation.  Moderate degenerative changes of the right hip joint.  No erosion or periosteal elevation.  Soft tissue thickening at the distal stump.  No soft tissue gas. -PT following.  2.  Anterior maxillary abscess -Patient seen in consultation by oral surgeon, Dr. [name]. -Patient is status post I&D of anterior maxillary abscess, surgical extraction of teeth 4,5,6 ,11,12, 13, 20, 21, 22, 27, 28, 29. -Simple extraction teeth # 7, 8, 9, 10, 23, 24, 25, 26. -Status post angioplasty x 4  quadrants on 03/25/2023 per Dr. [name]. -Status post 7 days of Augmentin. -Patient currently on a regular diet. Continue oral hygiene and continue benzocaine mucosal gel for the mouth and throat 4 times daily as needed for mild pain.  3.  Unilateral hearing loss in the setting of cerumen impaction -Otoscopic exam revealed severely impacted ear. -Continue Debrox to both ears. -Earwax removal order placed. -Prior physician discussed case with Dr. Jenne Pane ENT who recommended that patient benefit from outpatient evaluation given that they have all the tools in the office. -ENT also stated may be able to get an audiogram but doubts that -ENT unfortunately states cannot do anything in-house given lack of equipment and cannot undergo microscopic examination while hospitalized. -ENT, Dr. Jenne Pane recommended continued flushing of the ears and continue Debrox and adding Ciprodex if necessary given that he feels that will take time for earwax to be removed.  4.  Chronic combined systolic diastolic CHF -Compensated. -2D echo done 08/2022 with a EF of 40% with global hypokinesis. -Coreg, hydralazine, Imdur, Lasix, Norvasc.   5.  Hypertension -Continue Lasix, Coreg, Imdur, hydralazine, Norvasc.   6.  COPD -Stable. -Xopenex nebs as needed.  7.  Anxiety -Stable. -Was on hydroxyzine as needed.  8.  GERD -Patient noted to have refused PPI.   9.  Constipation -Continue bowel regimen of MiraLAX and Dulcolax as needed.   10.  Chronic pain -Patient was on MS Contin 30 mg twice daily, oxycodone 20 mg every 4 hours as needed severe pain, tramadol 50 mg every 12 hours as needed moderate pain, gabapentin 900 mg 3 times daily. -Patient asking whether oxycodone interval may be decreased. -MS Contin was changed to OxyContin 30 mg twice daily. -Continue oxycodone 20 mg every 4 hours as needed breakthrough for severe pain  11.  Shoulder pain -Continue lidocaine patch and camphor topical.  12.  Schizoaffective  disorder -Lexapro, Abilify.   -Patient seen by psychiatry during the hospitalization.    13.  Obesity -BMI 29.3 kg/m. -Lifestyle modification -Outpatient follow-up.   DVT prophylaxis: Lovenox Code Status: Full Family Communication: Updated patient.  No family at bedside. Disposition: Unsafe disposition.  Patient with lack of housing.  TOC assisting.  Status is: Inpatient Remains inpatient appropriate because: Homelessness/unsafe disposition   Consultants:  Wound care RN Ria Bush 12/07/2022 Orthopedics: Dr. Lajoyce Corners 12/09/2022 Psychiatry: Dr. Viviano Simas 02/26/2023 Oral surgeon: Dr. [name] 03/20/2023  Procedures:  As noted above.  Antimicrobials:  Anti-infectives (From admission, onward)    Start     Dose/Rate Route Frequency Ordered Stop   03/19/23 0900  amoxicillin-clavulanate (AUGMENTIN) 875-125 MG per tablet 1 tablet        1 tablet Oral Every 12 hours 03/19/23 0809 04/01/23 2118   03/18/23 2000  Ampicillin-Sulbactam (UNASYN) 3 g in sodium chloride 0.9 % 100 mL IVPB  Status:  Discontinued        3 g 200 mL/hr over 30 Minutes Intravenous Every 6 hours 03/18/23 1939 03/19/23 0809   03/18/23 1645  Ampicillin-Sulbactam (UNASYN) 3 g in sodium chloride 0.9 % 100 mL IVPB  Status:  Discontinued        3 g 200  mL/hr over 30 Minutes Intravenous Every 6 hours 03/18/23 1555 03/18/23 1939   12/26/22 2200  doxycycline (VIBRA-TABS) tablet 100 mg        100 mg Oral Every 12 hours 12/26/22 1307 01/08/23 2126   12/24/22 1000  doxycycline (VIBRA-TABS) tablet 100 mg  Status:  Discontinued        100 mg Oral Every 12 hours 12/24/22 0715 12/26/22 1307   12/10/22 1830  ceFAZolin (ANCEF) IVPB 2g/100 mL premix  Status:  Discontinued        2 g 200 mL/hr over 30 Minutes Intravenous Every 8 hours 12/10/22 1731 12/10/22 1740   12/10/22 1330  ceFAZolin (ANCEF) IVPB 2g/100 mL premix        2 g 200 mL/hr over 30 Minutes Intravenous On call to O.R. 12/10/22 0959 12/10/22 1534   12/08/22 0530  vancomycin  (VANCOREADY) IVPB 1500 mg/300 mL        1,500 mg 150 mL/hr over 120 Minutes Intravenous Every 12 hours 12/07/22 1741 12/11/22 0955   12/08/22 0345  metroNIDAZOLE (FLAGYL) IVPB 500 mg        500 mg 100 mL/hr over 60 Minutes Intravenous Every 12 hours 12/07/22 1742 12/12/22 0344   12/07/22 2330  ceFEPIme (MAXIPIME) 2 g in sodium chloride 0.9 % 100 mL IVPB        2 g 200 mL/hr over 30 Minutes Intravenous Every 8 hours 12/07/22 1741 12/11/22 1529   12/07/22 2200  metroNIDAZOLE (FLAGYL) IVPB 500 mg  Status:  Discontinued        500 mg 100 mL/hr over 60 Minutes Intravenous Every 12 hours 12/07/22 2048 12/07/22 2056   12/07/22 1530  ceFEPIme (MAXIPIME) 2 g in sodium chloride 0.9 % 100 mL IVPB        2 g 200 mL/hr over 30 Minutes Intravenous  Once 12/07/22 1516 12/07/22 1734   12/07/22 1530  vancomycin (VANCOREADY) IVPB 1500 mg/300 mL        1,500 mg 150 mL/hr over 120 Minutes Intravenous  Once 12/07/22 1516 12/09/22 0333   12/07/22 1515  metroNIDAZOLE (FLAGYL) IVPB 500 mg        500 mg 100 mL/hr over 60 Minutes Intravenous  Once 12/07/22 1506 12/07/22 1734   12/07/22 1500  ceFAZolin (ANCEF) IVPB 1 g/50 mL premix  Status:  Discontinued        1 g 100 mL/hr over 30 Minutes Intravenous  Once 12/07/22 1452 12/07/22 1513         Subjective: In wheelchair.  Sitting up.  Denies any chest pain or shortness of breath.  Still with difficulty hearing in the right ear.  Please change from long-acting morphine to long-acting oxycodone is helping.  States does not get his prosthesis until tomorrow.   Objective: Vitals:   05/23/23 2112 05/24/23 1559 05/24/23 1931 05/24/23 2210  BP: (!) 146/93 113/71 (!) 191/91 132/61  Pulse:  73 (!) 47   Resp: 16 18 17    Temp: 98.2 F (36.8 C) (!) 97.5 F (36.4 C) 98.2 F (36.8 C)   TempSrc: Oral Oral    SpO2: 93% 97% 97%   Weight:      Height:        Intake/Output Summary (Last 24 hours) at 05/25/2023 0955 Last data filed at 05/24/2023 1700 Gross per 24  hour  Intake 600 ml  Output 250 ml  Net 350 ml    Filed Weights   05/09/23 0500 05/11/23 0428 05/12/23 0633  Weight: 90.7 kg 90 kg  90 kg    Examination:  General exam: NAD. Respiratory system: Clear to auscultation bilaterally.  No wheezes, no crackles, no rhonchi.  Fair air movement.  Speaking full sentences.  Cardiovascular system: RRR no murmurs rubs or gallops.  No JVD.  No lower extremity edema.  Gastrointestinal system: Abdomen is soft, nontender, nondistended, positive bowel sounds.  No rebound.  No guarding.   Central nervous system: Alert and oriented. No focal neurological deficits. Extremities: Status post left BKA and right AKA.  Skin: No rashes, lesions or ulcers Psychiatry: Judgement and insight appear normal. Mood & affect appropriate.     Data Reviewed: I have personally reviewed following labs and imaging studies  CBC: Recent Labs  Lab 05/20/23 1216  WBC 5.9  HGB 14.1  HCT 41.7  MCV 90.1  PLT 292     Basic Metabolic Panel: Recent Labs  Lab 05/20/23 1216  NA 139  K 3.7  CL 105  CO2 25  GLUCOSE 155*  BUN 15  CREATININE 0.98  CALCIUM 9.2  MG 2.0     GFR: Estimated Creatinine Clearance: 94.4 mL/min (by C-G formula based on SCr of 0.98 mg/dL).  Liver Function Tests: No results for input(s): "AST", "ALT", "ALKPHOS", "BILITOT", "PROT", "ALBUMIN" in the last 168 hours.  CBG: No results for input(s): "GLUCAP" in the last 168 hours.   No results found for this or any previous visit (from the past 240 hour(s)).       Radiology Studies: No results found.      Scheduled Meds:  amLODipine  10 mg Oral Daily   ARIPiprazole  5 mg Oral Daily   vitamin C  1,000 mg Oral Daily   carbamide peroxide  5 drop Both EARS BID   carvedilol  6.25 mg Oral BID WC   chlorhexidine  15 mL Mouth/Throat TID   enoxaparin (LOVENOX) injection  40 mg Subcutaneous Q24H   escitalopram  5 mg Oral Daily   feeding supplement  237 mL Oral BID BM   furosemide   20 mg Oral Daily   gabapentin  900 mg Oral TID   hydrALAZINE  25 mg Oral Q8H   isosorbide mononitrate  15 mg Oral Daily   lidocaine  1 patch Transdermal Q24H   melatonin  3 mg Oral QHS   multivitamin with minerals  1 tablet Oral Daily   nystatin  5 mL Oral QID   oxyCODONE  30 mg Oral Q12H   Continuous Infusions:   LOS: 169 days    Time spent: 35 minutes    Ramiro Harvest, MD [facility]   To contact the attending provider between 7A-7P or the covering provider during after hours 7P-7A, please log into the web site [email or web address] and access using universal [place] password for that web site. If you do not have the password, please call the hospital operator.  05/25/2023, 9:55 AM

## 2023-05-26 DIAGNOSIS — K047 Periapical abscess without sinus: Secondary | ICD-10-CM | POA: Diagnosis not present

## 2023-05-26 DIAGNOSIS — I96 Gangrene, not elsewhere classified: Secondary | ICD-10-CM | POA: Diagnosis not present

## 2023-05-26 DIAGNOSIS — I5040 Unspecified combined systolic (congestive) and diastolic (congestive) heart failure: Secondary | ICD-10-CM | POA: Diagnosis not present

## 2023-05-26 DIAGNOSIS — I10 Essential (primary) hypertension: Secondary | ICD-10-CM | POA: Diagnosis not present

## 2023-05-26 NOTE — Plan of Care (Signed)
  Problem: Pain Managment: Goal: General experience of comfort will improve Outcome: Progressing   Problem: Education: Goal: Knowledge of General Education information will improve Description: Including pain rating scale, medication(s)/side effects and non-pharmacologic comfort measures Outcome: Progressing   

## 2023-05-26 NOTE — Progress Notes (Signed)
PROGRESS NOTE    [name]  ZOX:[id number] DOB: 30-Jul-1966 DOA: 12/07/2022 PCP: [name], NP    Chief Complaint  Patient presents with   Leg Swelling   Leg Pain    Brief Narrative:  [name] is a 57 y.o. male with a history of heart failure, CAD, right BKA, COPD, hyperlipidemia, hyperlipidemia, polyneuropathy, schizoaffective/bipolar disorder, was admitted to the hospital with left lower extremity cellulitis and gangrene.   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       During hospitalization, patient was also noted to have maxillary abscess and periapical abscess and underwent oral surgery with I&D and of anterior maxillary abscess and surgical extraction of multiple teeth in alveoloplasty. Currently stable.   Hospital stay prolonged due to difficulty in placement due to homelessness status status post left BKA and right AKA.   [facility],  have offered to provide a prosthesis for LLE (the BKA)    The patient scheduled an appointment Monday (June 3rd, 2024) at noon to meet up with patient and the Prosthetist to cast the L residual limb and make a plan and patient to receive a prosthesis for his left leg pro bono.   **Patient met with the Prosthetist for his Left Leg who brought initial test socket and will bring another test socket this week (05/18/23)and the patient is to wear the shrinker at all times except when bathing and can wear the shrinker with 2 layers to above his knee or with 1 layer above the knee in 3 to 4 layers more distally to apply more pressure for distal limb shaping.  Per the physical therapist he is to wear the liner during waking hours with initially starting 2 to 3 hours aware and then adding half an hour of wear each day until the patient gets used to wearing the liner.   Still has a difficult disposition given housing issue but CSW is involved.    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 -Patient seen in consultation by orthopedics underwent left BKA 12/10/2022. -Patient with prior history of right BKA, wheelchair-bound. -Patient noted to have been stable and awaiting disposition. -Patient on Lasix.  Edema electrolytes were checked potassium was low and repleted. -Repeat labs obtained today with stable electrolytes. -Per physical therapy patient to get a prosthesis for his left side pro bono tomorrow, 05/26/2023 prosthesis consult was placed.   -Plan was for patient to meet with prosthelist June 3 at noon to cast for left lower extremity mold. -PT assisting in donning left lower extremity shrinker and getting worked up for prosthesis. -PT also recommending possible prosthesis for right leg and recommended getting an x-ray to evaluate his anatomy which showed interval right lower extremity amputation at the knee joint.  Hip joint alignment maintained without dislocation.  Moderate degenerative changes of the right hip joint.  No erosion or periosteal elevation.  Soft tissue thickening at the distal stump.  No soft tissue gas. -PT following.  2.  Anterior maxillary abscess -Patient seen in consultation by oral surgeon, Dr. [name]. -Patient is status post I&D of anterior maxillary abscess, surgical extraction of teeth 4,5,6 ,11,12, 13, 20, 21, 22, 27, 28, 29. -Simple extraction teeth # 7, 8, 9, 10, 23, 24, 25, 26. -Status post angioplasty x 4  quadrants on 03/25/2023 per Dr. [name]. -Status post 7 days of Augmentin. -Patient currently on a regular diet. Continue oral hygiene and continue benzocaine mucosal gel for the mouth and throat 4 times daily as needed for mild pain.  3.  Unilateral hearing loss in the setting of cerumen impaction -Otoscopic exam revealed severely impacted ear. -Continue Debrox to both ears. -Earwax removal order placed. -Prior physician discussed case with Dr. Jenne Pane ENT who recommended that patient benefit from outpatient evaluation given that they have all the tools in the office. -ENT also stated may be able to get an audiogram but doubts that -ENT unfortunately states cannot do anything in-house given lack of equipment and cannot undergo microscopic examination while hospitalized. -ENT, Dr. Jenne Pane recommended continued flushing of the ears and continue Debrox and adding Ciprodex if necessary given that he feels that will take time for earwax to be removed.  4.  Chronic combined systolic diastolic CHF -Compensated. -2D echo done 08/2022 with a EF of 40% with global hypokinesis. -Coreg, hydralazine, Imdur, Lasix, Norvasc.   5.  Hypertension -Coreg, Imdur, hydralazine, Norvasc, Lasix.   6.  COPD -Stable. -Xopenex as needed.  7.  Anxiety -Stable. -Was on hydroxyzine as needed.  8.  GERD -Patient refused PPI early on during the hospitalization.   9.  Constipation -Continue bowel regimen of MiraLAX and Dulcolax as needed.    10.  Chronic pain -Patient was on MS Contin 30 mg twice daily, oxycodone 20 mg every 4 hours as needed severe pain, tramadol 50 mg every 12 hours as needed moderate pain, gabapentin 900 mg 3 times daily. -Patient asked whether oxycodone interval may be decreased. -MS Contin was changed to OxyContin 30 mg twice daily. -Continue oxycodone 20 mg every 4 hours as needed breakthrough for severe pain  11.  Shoulder pain -Continue lidocaine patch and camphor topical.   12.   Schizoaffective disorder -Continue Abilify, Lexapro.   -Patient was assessed by psychiatry earlier on during the hospitalization.   13.  Obesity -BMI 29.3 kg/m. -Lifestyle modification -Outpatient follow-up.   DVT prophylaxis: Lovenox Code Status: Full Family Communication: Updated patient.  No family at bedside. Disposition: Unsafe disposition.  Patient with lack of housing.  TOC assisting.  Status is: Inpatient Remains inpatient appropriate because: Homelessness/unsafe disposition   Consultants:  Wound care RN Ria Bush 12/07/2022 Orthopedics: Dr. Lajoyce Corners 12/09/2022 Psychiatry: Dr. Viviano Simas 02/26/2023 Oral surgeon: Dr. [name] 03/20/2023  Procedures:  As noted above.  Antimicrobials:  Anti-infectives (From admission, onward)    Start     Dose/Rate Route Frequency Ordered Stop   03/19/23 0900  amoxicillin-clavulanate (AUGMENTIN) 875-125 MG per tablet 1 tablet        1 tablet Oral Every 12 hours 03/19/23 0809 04/01/23 2118   03/18/23 2000  Ampicillin-Sulbactam (UNASYN) 3 g in sodium chloride 0.9 % 100 mL IVPB  Status:  Discontinued        3 g 200 mL/hr over 30 Minutes Intravenous Every 6 hours 03/18/23 1939 03/19/23 0809   03/18/23 1645  Ampicillin-Sulbactam (UNASYN) 3 g in sodium chloride 0.9 % 100 mL IVPB  Status:  Discontinued  3 g 200 mL/hr over 30 Minutes Intravenous Every 6 hours 03/18/23 1555 03/18/23 1939   12/26/22 2200  doxycycline (VIBRA-TABS) tablet 100 mg        100 mg Oral Every 12 hours 12/26/22 1307 01/08/23 2126   12/24/22 1000  doxycycline (VIBRA-TABS) tablet 100 mg  Status:  Discontinued        100 mg Oral Every 12 hours 12/24/22 0715 12/26/22 1307   12/10/22 1830  ceFAZolin (ANCEF) IVPB 2g/100 mL premix  Status:  Discontinued        2 g 200 mL/hr over 30 Minutes Intravenous Every 8 hours 12/10/22 1731 12/10/22 1740   12/10/22 1330  ceFAZolin (ANCEF) IVPB 2g/100 mL premix        2 g 200 mL/hr over 30 Minutes Intravenous On call to O.R. 12/10/22 0959  12/10/22 1534   12/08/22 0530  vancomycin (VANCOREADY) IVPB 1500 mg/300 mL        1,500 mg 150 mL/hr over 120 Minutes Intravenous Every 12 hours 12/07/22 1741 12/11/22 0955   12/08/22 0345  metroNIDAZOLE (FLAGYL) IVPB 500 mg        500 mg 100 mL/hr over 60 Minutes Intravenous Every 12 hours 12/07/22 1742 12/12/22 0344   12/07/22 2330  ceFEPIme (MAXIPIME) 2 g in sodium chloride 0.9 % 100 mL IVPB        2 g 200 mL/hr over 30 Minutes Intravenous Every 8 hours 12/07/22 1741 12/11/22 1529   12/07/22 2200  metroNIDAZOLE (FLAGYL) IVPB 500 mg  Status:  Discontinued        500 mg 100 mL/hr over 60 Minutes Intravenous Every 12 hours 12/07/22 2048 12/07/22 2056   12/07/22 1530  ceFEPIme (MAXIPIME) 2 g in sodium chloride 0.9 % 100 mL IVPB        2 g 200 mL/hr over 30 Minutes Intravenous  Once 12/07/22 1516 12/07/22 1734   12/07/22 1530  vancomycin (VANCOREADY) IVPB 1500 mg/300 mL        1,500 mg 150 mL/hr over 120 Minutes Intravenous  Once 12/07/22 1516 12/09/22 0333   12/07/22 1515  metroNIDAZOLE (FLAGYL) IVPB 500 mg        500 mg 100 mL/hr over 60 Minutes Intravenous  Once 12/07/22 1506 12/07/22 1734   12/07/22 1500  ceFAZolin (ANCEF) IVPB 1 g/50 mL premix  Status:  Discontinued        1 g 100 mL/hr over 30 Minutes Intravenous  Once 12/07/22 1452 12/07/22 1513         Subjective: In wheelchair.  Sitting up.  Denies any chest pain or shortness of breath.  Still with difficulty hearing his right ear but feels he is slowly getting better.  Awaiting prosthesis which she states now will be done on Friday.   Objective: Vitals:   05/24/23 2210 05/25/23 1225 05/25/23 2028 05/26/23 0645  BP: 132/61 107/78 (!) 124/99 117/88  Pulse:  81 83 70  Resp:  18 17   Temp:  98.3 F (36.8 C) 98 F (36.7 C)   TempSrc:  Oral    SpO2:  97% 97%   Weight:      Height:       No intake or output data in the 24 hours ending 05/26/23 1233  Filed Weights   05/09/23 0500 05/11/23 0428 05/12/23 1914  Weight:  90.7 kg 90 kg 90 kg    Examination:  General exam: NAD. Respiratory system: CTAB.  No wheezes, no crackles, no rhonchi.  Fair air movement.  Speaking  in full sentences.  Cardiovascular system: Regular rate rhythm no murmurs rubs or gallops.  No JVD.  No lower extremity edema.  Gastrointestinal system: Abdomen is soft, nontender, nondistended, positive bowel sounds.  No rebound.  No guarding.   Central nervous system: Alert and oriented. No focal neurological deficits. Extremities: Status post left BKA and right AKA.  Skin: No rashes, lesions or ulcers Psychiatry: Judgement and insight appear normal. Mood & affect appropriate.     Data Reviewed: I have personally reviewed following labs and imaging studies  CBC: Recent Labs  Lab 05/20/23 1216  WBC 5.9  HGB 14.1  HCT 41.7  MCV 90.1  PLT 292     Basic Metabolic Panel: Recent Labs  Lab 05/20/23 1216  NA 139  K 3.7  CL 105  CO2 25  GLUCOSE 155*  BUN 15  CREATININE 0.98  CALCIUM 9.2  MG 2.0     GFR: Estimated Creatinine Clearance: 94.4 mL/min (by C-G formula based on SCr of 0.98 mg/dL).  Liver Function Tests: No results for input(s): "AST", "ALT", "ALKPHOS", "BILITOT", "PROT", "ALBUMIN" in the last 168 hours.  CBG: No results for input(s): "GLUCAP" in the last 168 hours.   No results found for this or any previous visit (from the past 240 hour(s)).       Radiology Studies: No results found.      Scheduled Meds:  amLODipine  10 mg Oral Daily   ARIPiprazole  5 mg Oral Daily   vitamin C  1,000 mg Oral Daily   carbamide peroxide  5 drop Both EARS BID   carvedilol  6.25 mg Oral BID WC   chlorhexidine  15 mL Mouth/Throat TID   enoxaparin (LOVENOX) injection  40 mg Subcutaneous Q24H   escitalopram  5 mg Oral Daily   feeding supplement  237 mL Oral BID BM   furosemide  20 mg Oral Daily   gabapentin  900 mg Oral TID   hydrALAZINE  25 mg Oral Q8H   isosorbide mononitrate  15 mg Oral Daily   lidocaine   1 patch Transdermal Q24H   melatonin  3 mg Oral QHS   multivitamin with minerals  1 tablet Oral Daily   nystatin  5 mL Oral QID   oxyCODONE  30 mg Oral Q12H   Continuous Infusions:   LOS: 170 days    Time spent: 35 minutes    Ramiro Harvest, MD [facility]   To contact the attending provider between 7A-7P or the covering provider during after hours 7P-7A, please log into the web site [email or web address] and access using universal  password for that web site. If you do not have the password, please call the hospital operator.  05/26/2023, 12:33 PM

## 2023-05-26 NOTE — Plan of Care (Signed)
  Problem: Pain Managment: Goal: General experience of comfort will improve Outcome: Progressing   Problem: Safety: Goal: Ability to remain free from injury will improve Outcome: Progressing   Problem: Skin Integrity: Goal: Risk for impaired skin integrity will decrease Outcome: Progressing   Problem: Education: Goal: Knowledge of General Education information will improve Description: Including pain rating scale, medication(s)/side effects and non-pharmacologic comfort measures Outcome: Progressing   

## 2023-05-27 DIAGNOSIS — I96 Gangrene, not elsewhere classified: Secondary | ICD-10-CM | POA: Diagnosis not present

## 2023-05-27 MED ORDER — BACLOFEN 10 MG PO TABS
5.0000 mg | ORAL_TABLET | Freq: Three times a day (TID) | ORAL | Status: DC | PRN
  Administered 2023-05-27 – 2023-05-30 (×4): 5 mg via ORAL
  Filled 2023-05-27 (×4): qty 1

## 2023-05-27 NOTE — Progress Notes (Signed)
Physical Therapy Treatment Patient Details Name: [name] MRN: [id number] DOB: July 10, 1966 Today's Date: 05/27/2023   History of Present Illness Pt is a 57 y/o M who presented 12/17/22 with L ankle cellulitis and underwent L BKA on 1/10. Oral abscesses noted, underwent oral surgery Wed, 4/24; Initiated fabrication of prosthesis for LLE on 6/3; PMH includes asthma, bioplar 1, depression, schizoaffective disorder, CVA, chronic CHF, homelessness, HTN, DM2, cocaine and cannabis use, and R knee disarticulation    PT Comments    Continuing work on functional mobility and activity tolerance;  Session focused on pt education for limb preparation for prostheses;   Performed soft tissue mobilization, gentle pressure, and scar mobilization for LLE -- giving [name] an example of how much pressure to apply when he does his own soft tissue mobilization to L and R residual limbs; Gurjot was able to verbalize technique and rationale for performing soft tissue mobilization; then turned attention to R residual limb, which has been a lot more tender to palpation and painful with therex that require weight bearing (planks, etc); Gentler pressure was given than L, but able to apply more pressure to very distal end of R residual limb than previous attempts at exploring the soft tissue at distal RLE;   Suggested to [facility] to hand wash and air dry his shrinkers; he has 2 for LLE and 2 for RLE so that he can keep one on while the other is washed and dries; He told me he has been sending his shrinkers home with friends for laundering; He hasn't been wearing the R shrinker which is specifically for AKAs because no one has shown him how to wear them; took time to initiate putting on the R shrinker, which you pull up on the lateral side up to the waist, and the medial side gets very close to the groin, and then there is a strap that goes around the whole waist and fastens with velcro; Got the AKA shrinker close to all the way on,  but not quite -- difficult to get it on sitting up in a wheelchair, but he could pull the shrinker all the way up with relative ease in the bed;   After that, [name] donned his liner for the LLE with min assist to begin rolling it up the posterior aspect of his lower leg; then Burdett donned the liner correctly the rest of the way; He voiced that it is hard right now to keep the liner on past 2 hours; encouraged Elison to challenge himself to wear longer in prep for his prosthesis;   I'm hopeful that with more effective soft tissue mobilization, shrinker wear, and liner wear, Saw's LLE will be better prepped for getting his prosthesis (planned for this Friday!);   Given he will be getting a new L prosthesis soon, that donning the shrinkers and liner is challenging with the amount of compression, RUE dyscoordination from prev CVA, and noted shoulder pain in Dr. Eliane Decree most recent note, it is appropriate to re-consult OT; have discussed with OT colleague, and will plan to re-consult OT next week (after getting his prosthesis)   Recommendations for follow up therapy are one component of a multi-disciplinary discharge planning process, led by the attending physician.  Recommendations may be updated based on patient status, additional functional criteria and insurance authorization.  Follow Up Recommendations  Can patient physically be transported by private vehicle: Yes (Currently recommend a sliding board for car transfers; may not need once he gets L prosthesis)  Assistance Recommended at Discharge PRN  Patient can return home with the following Assist for transportation   Equipment Recommendations  Other (comment) (tub bench, sliding board)    Recommendations for Other Services  (Recommend starting the Mental/Behavioral Health support through [name] to start while Wendell is here in the hospital; [name] consult)     Precautions / Restrictions Precautions Precautions:  None Precaution Comments: R knee disrticulation, L BKA; Fall risk is present, but minimal Restrictions RLE Weight Bearing: Weight bearing as tolerated LLE Weight Bearing: Weight bearing as tolerated           Cognition Arousal/Alertness: Awake/alert Behavior During Therapy: WFL for tasks assessed/performed Overall Cognitive Status: Within Functional Limits for tasks assessed (for simple mobility tasks)                                 General Comments: When Demarques understands rationale for an exercise or activity, and that rationale is in line with his goals, he participates much better        Exercises Amputee Exercises Hip Extension:  (This PT demonstrated supine hip extension stretches with LEs hanging off of mat table)    General Comments General comments (skin integrity, edema, etc.): REports is wearing his shrinker LLE and liner consistently, though not quite as recommended; no difficulty with washing his liner      Pertinent Vitals/Pain Pain Assessment Faces Pain Scale: Hurts even more Pain Location: L distal residual limb with weight bearing in initial prosthesis socket trial Pain Descriptors / Indicators: Grimacing    Home Living                          Prior Function            PT Goals (current goals can now be found in the care plan section) Acute Rehab PT Goals Patient Stated Goal: Prepare for prosthetics fitting; Would like to get housing in [facility] through the Transition to [employer] PT Goal Formulation: With patient Time For Goal Achievement: 05/19/23 Potential to Achieve Goals: Good Progress towards PT goals: Progressing toward goals    Frequency    Min 1X/week      PT Plan Current plan remains appropriate;Frequency needs to be updated (When prosthesis training begins, will incr frequency; anticipate will begin 2x/week starting Monday, 6/24)    Co-evaluation              AM-PAC PT "6  Clicks" Mobility   Outcome Measure  Help needed turning from your back to your side while in a flat bed without using bedrails?: None Help needed moving from lying on your back to sitting on the side of a flat bed without using bedrails?: None Help needed moving to and from a bed to a chair (including a wheelchair)?: None Help needed standing up from a chair using your arms (e.g., wheelchair or bedside chair)?: Total Help needed to walk in hospital room?: Total Help needed climbing 3-5 steps with a railing? : Total 6 Click Score: 15    End of Session Equipment Utilized During Treatment: Other (comment);Gait belt (revised socket provided by [name]) Activity Tolerance: Patient tolerated treatment well Patient left:  (managing independently) Nurse Communication: Mobility status PT Visit Diagnosis: Muscle weakness (generalized) (M62.81);Other abnormalities of gait and mobility (R26.89)     Time: 1440-1510 PT Time Calculation (min) (ACUTE ONLY): 30 min  Charges:  $  Therapeutic Activity: 8-22 mins $Self Care/Home Management: 8-22                     Van Clines, PT  Acute Rehabilitation Services Office [phone] Secure Chat welcomed    [name] 05/27/2023, 5:35 PM

## 2023-05-27 NOTE — Plan of Care (Signed)
  Problem: Pain Managment: Goal: General experience of comfort will improve Outcome: Progressing   Problem: Safety: Goal: Ability to remain free from injury will improve Outcome: Progressing   Problem: Skin Integrity: Goal: Risk for impaired skin integrity will decrease Outcome: Progressing   

## 2023-05-27 NOTE — Progress Notes (Signed)
PROGRESS NOTE   [name]  JXB:[id number] DOB: 1966-10-11 DOA: 12/07/2022 PCP: [name], NP   Date of Service: the patient was seen and examined on 05/27/2023  Brief Narrative:  [name] is a 57 y.o. male with a history of heart failure, CAD, right BKA, COPD, hyperlipidemia, hyperlipidemia, polyneuropathy, schizoaffective/bipolar disorder, was admitted to the hospital with left lower extremity cellulitis and gangrene.  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       During hospitalization, patient was also noted to have maxillary abscess and periapical abscess and underwent oral surgery with I&D and of anterior maxillary abscess and surgical extraction of multiple teeth in alveoloplasty. Hospital stay prolonged due to difficulty in placement due to homelessness status status post left BKA and right AKA.   [facility],  have offered to provide a prosthesis for LLE (the BKA)    The patient scheduled an appointment Monday (June 3rd, 2024) at noon to meet up with patient and the Prosthetist to cast the L residual limb and make a plan and patient to receive a prosthesis for his left leg pro bono.   **Patient met with the Prosthetist for his Left Leg who brought initial test socket and will bring another test socket this week (05/18/23)and the patient is to wear the shrinker at all times except when bathing and can wear the shrinker with 2 layers to above his knee or with 1 layer above the knee in 3 to 4 layers more distally to apply more pressure for distal limb shaping.  Per the physical therapist he is to wear the liner during waking hours with initially starting 2 to 3 hours aware and then adding half an hour of wear each day until the patient gets used to wearing the liner.   Still has a difficult disposition given housing instability but CSW is involved.    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 Patient initially was placed on empiric IV antibiotics of vancomycin, cefepime and Flagyl. Underwent left BKA 12/10/2022. -Per physical therapy patient to get a prosthesis for his left side pro bono . -PT also recommending possible prosthesis for right leg and recommended getting an x-ray to evaluate his anatomy which showed interval right lower extremity amputation at the knee joint.  Hip joint alignment maintained without dislocation.  Moderate degenerative changes of the right hip joint.  No erosion or periosteal elevation.  Soft tissue thickening at the distal stump.  No soft tissue gas. -Lovenox for dvt ppx   Housing instability  Patient noted to have been stable and awaiting disposition -Continue discharge planning   Anterior maxillary abscess Seen in consultation by oral surgeon, Dr. [name]. Patient is s/p I&D of anterior maxillary abscess, surgical extraction of teeth 4,5,6 ,11,12, 13, 20, 21, 22, 27, 28, 29. Simple extraction teeth # 7, 8, 9, 10, 23, 24, 25, 26. Status post angioplasty x 4 quadrants on 03/25/2023. S/p 7 days of Augmentin. -Continue regular diet. -Continue oral hygiene and continue benzocaine mucosal gel for the mouth and throat 4 times daily as needed for mild pain.   Unilateral hearing loss in the setting of cerumen impaction Otoscopic exam revealed severely impacted ear. Prior physician discussed case with Dr. Jenne Pane ENT who recommended that patient benefit from outpatient evaluation. Recommended continued flushing of the ears and continue Debrox and adding Ciprodex if necessary. -Continue Debrox to  both ears. -Earwax removal order placed.   Chronic combined systolic diastolic CHF Compensated. 2D echo done 08/2022 with a EF of 40% with global hypokinesis. -Coreg, hydralazine, Imdur, Lasix, Norvasc.    Hypertension BP 119/79 -Continue Coreg, Imdur, hydralazine, Norvasc and Lasix.     COPD Stable. -Continue Xopenex as needed.   Anxiety Stable. -Continue Hydroxyzine as needed.   GERD -Patient refused PPI early on during the hospitalization.    Constipation -Continue bowel regimen of MiraLAX and Dulcolax as needed.     Chronic pain -Continue OxyContin 30 mg twice daily. -Continue oxycodone 20 mg every 4 hours as needed  -Continue Tramadol 50mg  Q12HPRN    Shoulder pain -Continue lidocaine patch and camphor topical.    Schizoaffective disorder -Continue Abilify, Lexapro.     Obesity BMI 29.3 kg/m. -Lifestyle modification -Outpatient follow-up.  Subjective:  No acute concerns. Per RN pt has been refusing labs and lovenox. I explained the risks of DVT/PE if pt does not take the lovenox. Pt was agreeable.   Physical Exam:  Vitals:   05/25/23 2028 05/26/23 0645 05/26/23 1432 05/26/23 2125  BP: (!) 124/99 117/88 (!) 132/100 119/79  Pulse: 83 70 88 78  Resp: 17  19 18   Temp: 98 F (36.7 C)  98.2 F (36.8 C) 98.5 F (36.9 C)  TempSrc:   Oral Oral  SpO2: 97%  100% 98%  Weight:      Height:       General: Alert, no acute distress, well appearing, moving around the unit with his wheelchair Cardio: Normal S1 and S2, RRR, no r/m/g Pulm: CTAB, normal work of breathing Extremities: bilateral BKA Neuro: Cranial nerves grossly intact    Data Reviewed:  I have personally reviewed and interpreted labs, imaging.   CBC: Recent Labs  Lab 05/20/23 1216  WBC 5.9  HGB 14.1  HCT 41.7  MCV 90.1  PLT 292   Basic Metabolic Panel: Recent Labs  Lab 05/20/23 1216  NA 139  K 3.7  CL 105  CO2 25  GLUCOSE 155*  BUN 15  CREATININE 0.98  CALCIUM 9.2  MG 2.0   GFR: Estimated Creatinine Clearance: 94.4 mL/min (by C-G formula based on SCr of 0.98 mg/dL). Liver Function Tests: No results for input(s): "AST", "ALT", "ALKPHOS", "BILITOT", "PROT", "ALBUMIN" in the last 168 hours.  Coagulation Profile: No results for input(s): "INR", "PROTIME" in the  last 168 hours.   Code Status:  Full code.  Code status decision has been confirmed with: patient  Family Communication:     Severity of Illness:  The appropriate patient status for this patient is INPATIENT. Inpatient status is judged to be reasonable and necessary in order to provide the required intensity of service to ensure the patient's safety. The patient's presenting symptoms, physical exam findings, and initial radiographic and laboratory data in the context of their chronic comorbidities is felt to place them at high risk for further clinical deterioration. Furthermore, it is not anticipated that the patient will be medically stable for discharge from the hospital within 2 midnights of admission.   * I certify that at the point of admission it is my clinical judgment that the patient will require inpatient hospital care spanning beyond 2 midnights from the point of admission due to high intensity of service, high risk for further deterioration and high frequency of surveillance required.*   Author:  Rolm Gala MD  05/27/2023 9:30 AM

## 2023-05-28 DIAGNOSIS — I96 Gangrene, not elsewhere classified: Secondary | ICD-10-CM | POA: Diagnosis not present

## 2023-05-28 NOTE — Progress Notes (Signed)
PROGRESS NOTE   [name]  ZOX:[id number] DOB: 1966-02-18 DOA: 12/07/2022 PCP: [name], NP   Date of Service: the patient was seen and examined on 05/28/2023  Brief Narrative:  [name] is a 57 y.o. male with a history of heart failure, CAD, right BKA, COPD, hyperlipidemia, hyperlipidemia, polyneuropathy, schizoaffective/bipolar disorder, was admitted to the hospital with left lower extremity cellulitis and gangrene.  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       During hospitalization, patient was also noted to have maxillary abscess and periapical abscess and underwent oral surgery with I&D and of anterior maxillary abscess and surgical extraction of multiple teeth in alveoloplasty. Hospital stay prolonged due to difficulty in placement due to homelessness status status post left BKA and right AKA.   [facility],  have offered to provide a prosthesis for LLE (the BKA)    The patient scheduled an appointment Monday (June 3rd, 2024) at noon to meet up with patient and the Prosthetist to cast the L residual limb and make a plan and patient to receive a prosthesis for his left leg pro bono.   **Patient met with the Prosthetist for his Left Leg who brought initial test socket and will bring another test socket this week (05/18/23)and the patient is to wear the shrinker at all times except when bathing and can wear the shrinker with 2 layers to above his knee or with 1 layer above the knee in 3 to 4 layers more distally to apply more pressure for distal limb shaping.  Per the physical therapist he is to wear the liner during waking hours with initially starting 2 to 3 hours aware and then adding half an hour of wear each day until the patient gets used to wearing the liner.   Still has a difficult disposition given housing instability but CSW is involved.    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 Patient initially was placed on empiric IV antibiotics of vancomycin, cefepime and Flagyl. Underwent left BKA 12/10/2022. -Per physical therapy patient to get a prosthesis for his left side pro bono . -PT also recommending possible prosthesis for right leg and recommended getting an x-ray to evaluate his anatomy which showed interval right lower extremity amputation at the knee joint.  Hip joint alignment maintained without dislocation.  Moderate degenerative changes of the right hip joint.  No erosion or periosteal elevation.  Soft tissue thickening at the distal stump.  No soft tissue gas. -Lovenox for dvt ppx   Housing instability  Patient noted to have been stable and awaiting disposition -Continue discharge planning   Anterior maxillary abscess Seen in consultation by oral surgeon, Dr. [name]. Patient is s/p I&D of anterior maxillary abscess, surgical extraction of teeth 4,5,6 ,11,12, 13, 20, 21, 22, 27, 28, 29. Simple extraction teeth # 7, 8, 9, 10, 23, 24, 25, 26. Status post angioplasty x 4 quadrants on 03/25/2023. S/p 7 days of Augmentin. -Continue regular diet. -Continue oral hygiene and continue benzocaine mucosal gel for the mouth and throat 4 times daily as needed for mild pain.   Unilateral hearing loss in the setting of cerumen impaction Otoscopic exam revealed severely impacted ear. Prior physician discussed case with Dr. Jenne Pane ENT who recommended that patient benefit from outpatient evaluation. Recommended continued flushing of the ears and continue Debrox and adding Ciprodex if necessary. -Continue Debrox to  both ears.   Chronic combined systolic diastolic CHF Compensated. 2D echo done 08/2022 with a EF of 40% with global hypokinesis. -Continue Coreg, hydralazine, Imdur, Lasix, Norvasc.    Hypertension BP 119/79 -Continue Coreg, Imdur, hydralazine, Norvasc and Lasix.    COPD Stable. -Continue  Xopenex as needed.   Anxiety Stable. -Continue Hydroxyzine as needed.   GERD -Patient refused PPI early on during the hospitalization.    Constipation -Continue bowel regimen of MiraLAX and Dulcolax as needed.     Chronic pain -Continue OxyContin 30 mg twice daily. -Continue oxycodone 20 mg every 4 hours as needed  -Continue Tramadol 50mg  Q12HPRN    Shoulder pain -Continue lidocaine patch and camphor topical.    Schizoaffective disorder -Continue Abilify, Lexapro.     Obesity BMI 29.3 kg/m. -Lifestyle modification -Outpatient follow-up.  Subjective:  No acute concerns today.  In good spirits. Pt agreed to have his lovenox injections.  Physical Exam:  Vitals:   05/26/23 1432 05/26/23 2125 05/27/23 1321 05/27/23 2224  BP: (!) 132/100 119/79 109/75 118/86  Pulse: 88 78 75 92  Resp: 19 18 18 16   Temp: 98.2 F (36.8 C) 98.5 F (36.9 C) 98.1 F (36.7 C) 98.6 F (37 C)  TempSrc: Oral Oral Oral Oral  SpO2: 100% 98% 98% 98%  Weight:      Height:       General: Alert, no acute distress, well appearing, moving around the unit with his wheelchair Cardio: Normal S1 and S2, RRR, no r/m/g Pulm: CTAB, normal work of breathing Extremities: bilateral BKA Neuro: Cranial nerves grossly intact  Data Reviewed:  I have personally reviewed and interpreted labs, imaging.   CBC: No results for input(s): "WBC", "NEUTROABS", "HGB", "HCT", "MCV", "PLT" in the last 168 hours.  Basic Metabolic Panel: No results for input(s): "NA", "K", "CL", "CO2", "GLUCOSE", "BUN", "CREATININE", "CALCIUM", "MG", "PHOS" in the last 168 hours.  GFR: Estimated Creatinine Clearance: 94.4 mL/min (by C-G formula based on SCr of 0.98 mg/dL). Liver Function Tests: No results for input(s): "AST", "ALT", "ALKPHOS", "BILITOT", "PROT", "ALBUMIN" in the last 168 hours.  Coagulation Profile: No results for input(s): "INR", "PROTIME" in the last 168 hours.   Code Status:  Full code.  Code status  decision has been confirmed with: patient  Family Communication:     Severity of Illness:  The appropriate patient status for this patient is INPATIENT. Inpatient status is judged to be reasonable and necessary in order to provide the required intensity of service to ensure the patient's safety. The patient's presenting symptoms, physical exam findings, and initial radiographic and laboratory data in the context of their chronic comorbidities is felt to place them at high risk for further clinical deterioration. Furthermore, it is not anticipated that the patient will be medically stable for discharge from the hospital within 2 midnights of admission.   * I certify that at the point of admission it is my clinical judgment that the patient will require inpatient hospital care spanning beyond 2 midnights from the point of admission due to high intensity of service, high risk for further deterioration and high frequency of surveillance required.*   Author:  Rolm Gala MD  05/28/2023 9:36 AM

## 2023-05-29 DIAGNOSIS — I96 Gangrene, not elsewhere classified: Secondary | ICD-10-CM | POA: Diagnosis not present

## 2023-05-29 NOTE — TOC Progression Note (Signed)
Transition of Care [facility]) - Progression Note    Patient Details  Name: [name] MRN: [id number] Date of Birth: 1966/06/05  [facility] CM/SW Contact  [name], [name] Phone Number: 05/29/2023, 1:46 PM  Clinical Narrative:    CSW received email from staff at [facility] who states patient has been approved for an apartment at [name]. Patient's address will be [address], [place], [name] [address]. [name] has scheduled a meeting for 11am on Monday 06/01/23 - CSW will participate via WebEx. [name] of the [facility] will come to the hospital to assist the patient with his participation in the meeting. Patient will be signing the lease on Monday. [name] staff will be obtaining furniture and household necessities for patient as well.  CSW spoke with patient on the unit to discuss information. Patient states he spoke with [name] who informed him of the good news. Patient states he is very excited and ready for his new apartment. CSW showed patient the apartment website so that he could get an idea of what his new environment will be like. Patient also wearing his new prosthetic which he is very thankful for. CSW obtained patient's clothing sizes so that clothing can be obtained prior to discharge.   Patient's final discharge date is not known at this time, CSW will communicate with MD once a final date is set.  Expected Discharge Plan: Home/Self Care Barriers to Discharge: Homeless with medical needs  Expected Discharge Plan and Services   Discharge Planning Services: CM Consult Post Acute Care Choice: Home Health Living arrangements for the past 2 months: Homeless                                       Social Determinants of Health (SDOH) Interventions SDOH Screenings   Food Insecurity: Food Insecurity Present (11/24/2022)  Housing: Medium Risk (12/07/2022)  Transportation Needs: Unmet Transportation Needs (12/07/2022)   Utilities: Not At Risk (12/07/2022)  Alcohol Screen: Medium Risk (06/24/2019)  Depression (PHQ2-9): Medium Risk (02/10/2021)  Tobacco Use: High Risk (03/26/2023)    Readmission Risk Interventions    12/09/2022    9:02 AM 12/01/2022    3:56 PM 08/18/2022    9:00 AM  Readmission Risk Prevention Plan  Transportation Screening Complete Complete Complete  Medication Review Ocean[facility]) Complete Complete Complete  PCP or Specialist appointment within 3-5 days of discharge Complete Complete Complete  HRI or Home Care Consult Complete Complete Complete  SW Recovery Care/Counseling Consult Complete Complete Complete  Palliative Care Screening Not Applicable Not Applicable Not Applicable  Skilled Nursing Facility Not Applicable Not Applicable Not Applicable  SNF Comments  n/a

## 2023-05-29 NOTE — Progress Notes (Signed)
Physical Therapy Treatment Patient Details Name: [name] MRN: [id number] DOB: [birth year] Today's Date: 05/29/2023   History of Present Illness Pt is a 57 y/o M who presented 12/17/22 with L ankle cellulitis and underwent L BKA on 1/10. Oral abscesses noted, underwent oral surgery Wed, 4/24; Initiated fabrication of prosthesis for LLE on 6/3; PMH includes asthma, bioplar 1, depression, schizoaffective disorder, CVA, chronic CHF, homelessness, HTN, DM2, cocaine and cannabis use, and R knee disarticulation    PT Comments    Continuing work on functional mobility and activity tolerance;  Cubby got his LLE prosthesis today, and stood on it within parallel bars x2 with min assist to get as near to fully upright as possible; Reports fit feels better today; Prosthesis wear instructions:  Today, 6/28 -- wear 1 hour;  Sat 6/29 -- wear 3 bouts of 1 hour each;  Sun 6/30 -- wear 3 bouts of 1.5 hours;  Mon 7/1 -- wear 3 bouts of 2 hours;  Tues 7/2 -- wear 3 bouts of 2.5 hours;  Adding a half hour to the length of wear bouts daily until Jaquel is wearing his prosthesis most of the day;   Will provide [name] with contact info for [facility] to continue to follow for LLE prosthesis, and start working on R prosthesis when ready;   Loucas is excited about the new apartment   Recommendations for follow up therapy are one component of a multi-disciplinary discharge planning process, led by the attending physician.  Recommendations may be updated based on patient status, additional functional criteria and insurance authorization.  Follow Up Recommendations  Can patient physically be transported by private vehicle: Yes (Currently recommend a sliding board for car transfers; may not need once he gets L prosthesis)    Assistance Recommended at Discharge PRN  Patient can return home with the following Assist for transportation   Equipment Recommendations  Other (comment) (tub bench, sliding board)     Recommendations for Other Services  (Recommend starting the Mental/Behavioral Health support through [name] to start while Everard is here in the hospital; [name] consult)     Precautions / Restrictions Precautions Precautions: None Precaution Comments: R knee disrticulation, L BKA; Fall risk is present, but minimal Restrictions RLE Weight Bearing: Weight bearing as tolerated LLE Weight Bearing: Weight bearing as tolerated     Mobility  Bed Mobility                    Transfers Overall transfer level: Needs assistance (Modified independent with wc transfers) Equipment used:  (Parallel bars in 4 MW (AIR) Rehab Gym) Transfers: Sit to/from Stand Sit to Stand: +2 physical assistance, +2 safety/equipment, Min assist           General transfer comment: Stood x2 in parallel bars on new LLE prosthesis; initiating with min guard assist and good rise; min assist given bilaterally from PT an dMob Specialist for power up    Ambulation/Gait               General Gait Details: Unable   Stairs             Wheelchair Mobility    Modified Rankin (Stroke Patients Only)       Balance                                            Cognition Arousal/Alertness:  Awake/alert Behavior During Therapy: WFL for tasks assessed/performed Overall Cognitive Status: Within Functional Limits for tasks assessed (for simple mobility tasks)                                 General Comments: When Redell understands rationale for an exercise or activity, and that rationale is in line with his goals, he participates much better        Exercises Amputee Exercises Hip Extension:  (This PT demonstrated supine hip extension stretches with LEs hanging off of mat table)    General Comments General comments (skin integrity, edema, etc.): pt donned liner without assist, slightly rotated, and  with Prosthetist correction, able to click-lock  prostesis on, with more clicks in standing      Pertinent Vitals/Pain Pain Assessment Pain Assessment: Faces Faces Pain Scale: Hurts little more Pain Location: L distal residual limb with weight bearing in initial prosthesis socket trial Pain Descriptors / Indicators: Grimacing Pain Intervention(s): Monitored during session    Home Living                          Prior Function            PT Goals (current goals can now be found in the care plan section) Acute Rehab PT Goals Patient Stated Goal: Prepare for prosthetics fitting; Would like to get housing in [facility] through the Transition to [employer] PT Goal Formulation: With patient Time For Goal Achievement: 06/09/23 Potential to Achieve Goals: Good Progress towards PT goals: Progressing toward goals    Frequency    Min 1X/week      PT Plan Current plan remains appropriate;Frequency needs to be updated (When prosthesis training begins, will incr frequency; anticipate will begin 2x/week starting Monday, 6/24)    Co-evaluation              AM-PAC PT "6 Clicks" Mobility   Outcome Measure  Help needed turning from your back to your side while in a flat bed without using bedrails?: None Help needed moving from lying on your back to sitting on the side of a flat bed without using bedrails?: None Help needed moving to and from a bed to a chair (including a wheelchair)?: None Help needed standing up from a chair using your arms (e.g., wheelchair or bedside chair)?: Total Help needed to walk in hospital room?: Total Help needed climbing 3-5 steps with a railing? : Total 6 Click Score: 15    End of Session Equipment Utilized During Treatment: Other (comment);Gait belt (revised socket provided by [employer]) Activity Tolerance: Patient tolerated treatment well Patient left:  (managing independently) Nurse Communication: Mobility status PT Visit Diagnosis: Muscle weakness  (generalized) (M62.81);Other abnormalities of gait and mobility (R26.89)     Time: 1250-1315 PT Time Calculation (min) (ACUTE ONLY): 25 min  Charges:  $Therapeutic Activity: 8-22 mins $Prosthetics Training: 8-15mins                     Van Clines, PT  Acute Rehabilitation Services Office [phone] Secure Chat welcomed    [name] 05/29/2023, 4:59 PM

## 2023-05-29 NOTE — Progress Notes (Addendum)
Mobility Specialist Progress Note:    05/29/23 1407  Mobility  Activity  (wheelchair mobility)  Level of Assistance Standby assist, set-up cues, supervision of patient - no hands on  Assistive Device Wheelchair  Distance Ambulated (ft) 800 ft  Activity Response Tolerated well  Mobility Referral Yes  $Mobility charge 1 Mobility  Mobility Specialist Start Time (ACUTE ONLY) 1207  Mobility Specialist Stop Time (ACUTE ONLY) 1220  Mobility Specialist Time Calculation (min) (ACUTE ONLY) 13 min   Received pt in wheelchair in room  having no complaints and agreeable to mobility. Pt was asymptomatic throughout ambulation and returned to room w/o fault. Left in wheelchair to continue mobility per request.  Thompson Grayer Mobility Specialist  Please contact vis Secure Chat or  Rehab Office [phone]

## 2023-05-29 NOTE — Progress Notes (Signed)
PROGRESS NOTE   [name]  EAV:[id number] DOB: 06/17/1966 DOA: 12/07/2022 PCP: [name], NP   Date of Service: the patient was seen and examined on 05/29/2023  Brief Narrative:  [name] is a 57 y.o. male with a history of heart failure, CAD, right BKA, COPD, hyperlipidemia, hyperlipidemia, polyneuropathy, schizoaffective/bipolar disorder, was admitted to the hospital with left lower extremity cellulitis and gangrene.  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       During hospitalization, patient was also noted to have maxillary abscess and periapical abscess and underwent oral surgery with I&D and of anterior maxillary abscess and surgical extraction of multiple teeth in alveoloplasty. Hospital stay prolonged due to difficulty in placement due to homelessness status status post left BKA and right AKA.   [facility],  have offered to provide a prosthesis for LLE (the BKA)    The patient scheduled an appointment Monday (June 3rd, 2024) at noon to meet up with patient and the Prosthetist to cast the L residual limb and make a plan and patient to receive a prosthesis for his left leg pro bono.   **Patient met with the Prosthetist for his Left Leg who brought initial test socket and will bring another test socket this week (05/18/23)and the patient is to wear the shrinker at all times except when bathing and can wear the shrinker with 2 layers to above his knee or with 1 layer above the knee in 3 to 4 layers more distally to apply more pressure for distal limb shaping.  Per the physical therapist he is to wear the liner during waking hours with initially starting 2 to 3 hours aware and then adding half an hour of wear each day until the patient gets used to wearing the liner.   Still has a difficult disposition given housing instability but CSW is involved.    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 Patient initially was placed on empiric IV antibiotics of vancomycin, cefepime and Flagyl. Underwent left BKA 12/10/2022. -Per physical therapy patient to get a prosthesis for his left side pro bono . -PT also recommending possible prosthesis for right leg and recommended getting an x-ray to evaluate his anatomy which showed interval right lower extremity amputation at the knee joint.  Hip joint alignment maintained without dislocation.  Moderate degenerative changes of the right hip joint.  No erosion or periosteal elevation.  Soft tissue thickening at the distal stump.  No soft tissue gas. -Lovenox for dvt ppx   Housing instability  Patient noted to have been stable and awaiting disposition -Continue discharge planning   Anterior maxillary abscess Seen by oral surgeon, Dr. [name]. Patient is s/p I&D of anterior maxillary abscess, surgical extraction of teeth 4,5,6 ,11,12, 13, 20, 21, 22, 27, 28, 29. Simple extraction teeth # 7, 8, 9, 10, 23, 24, 25, 26. Status post angioplasty x 4 quadrants on 03/25/2023. S/p 7 days of Augmentin. -Continue regular diet. -Continue oral hygiene and continue benzocaine mucosal gel for the mouth and throat 4 times daily as needed for mild pain.   Unilateral hearing loss in the setting of cerumen impaction Otoscopic exam revealed severely impacted ear. Prior physician discussed case with Dr. Jenne Pane ENT who recommended that patient benefit from outpatient evaluation. Recommended continued flushing of the ears and continue Debrox and adding Ciprodex if necessary. -Continue Debrox to both ears.  Chronic combined systolic diastolic CHF Compensated. 2D echo done 08/2022 with a EF of 40% with global hypokinesis. -Continue Coreg, hydralazine, Imdur, Lasix, Norvasc.    Hypertension 145/100 -Continue Coreg, Imdur, hydralazine, Norvasc and Lasix.    COPD Stable. -Continue Xopenex as needed.    Anxiety Stable. -Continue Hydroxyzine as needed.   GERD -Patient refused PPI early on during the hospitalization.    Constipation -Continue bowel regimen of MiraLAX and Dulcolax as needed.     Chronic pain -Continue OxyContin 30 mg twice daily. -Continue oxycodone 20 mg every 4 hours as needed  -Continue Tramadol 50mg  Q12HPRN    Shoulder pain -Continue lidocaine patch and camphor topical.    Schizoaffective disorder -Continue Abilify, Lexapro.     Obesity BMI 29.3 kg/m. -Lifestyle modification -Outpatient follow-up.  Subjective:  In good spirits this morning.  He told me he is getting his left leg prosthesis today.  Physical Exam:  Vitals:   05/27/23 1321 05/27/23 2224 05/28/23 1942 05/29/23 0808  BP: 109/75 118/86 (!) 145/100 (!) 155/87  Pulse: 75 92 83 70  Resp: 18 16 16 17   Temp: 98.1 F (36.7 C) 98.6 F (37 C) 97.8 F (36.6 C) 97.7 F (36.5 C)  TempSrc: Oral Oral Oral Oral  SpO2: 98% 98% 97% 100%  Weight:      Height:       General: Alert, no acute distress, well appearing, moving around the unit with his wheelchair Cardio: Normal S1 and S2, RRR, no r/m/g Pulm: CTAB, normal work of breathing Extremities: bilateral BKA Neuro: Cranial nerves grossly intact  Data Reviewed:  I have personally reviewed and interpreted labs, imaging.   CBC: No results for input(s): "WBC", "NEUTROABS", "HGB", "HCT", "MCV", "PLT" in the last 168 hours.  Basic Metabolic Panel: No results for input(s): "NA", "K", "CL", "CO2", "GLUCOSE", "BUN", "CREATININE", "CALCIUM", "MG", "PHOS" in the last 168 hours.  GFR: Estimated Creatinine Clearance: 94.4 mL/min (by C-G formula based on SCr of 0.98 mg/dL). Liver Function Tests: No results for input(s): "AST", "ALT", "ALKPHOS", "BILITOT", "PROT", "ALBUMIN" in the last 168 hours.  Coagulation Profile: No results for input(s): "INR", "PROTIME" in the last 168 hours.   Code Status:  Full code.  Code status decision has been  confirmed with: patient  Family Communication:     Severity of Illness:  The appropriate patient status for this patient is INPATIENT. Inpatient status is judged to be reasonable and necessary in order to provide the required intensity of service to ensure the patient's safety. The patient's presenting symptoms, physical exam findings, and initial radiographic and laboratory data in the context of their chronic comorbidities is felt to place them at high risk for further clinical deterioration. Furthermore, it is not anticipated that the patient will be medically stable for discharge from the hospital within 2 midnights of admission.   * I certify that at the point of admission it is my clinical judgment that the patient will require inpatient hospital care spanning beyond 2 midnights from the point of admission due to high intensity of service, high risk for further deterioration and high frequency of surveillance required.*   Author:  Rolm Gala MD  05/29/2023 9:18 AM

## 2023-05-30 DIAGNOSIS — I96 Gangrene, not elsewhere classified: Secondary | ICD-10-CM | POA: Diagnosis not present

## 2023-05-30 NOTE — Progress Notes (Addendum)
PROGRESS NOTE   [name]  ZOX:[id number] DOB: 09/07/1966 DOA: 12/07/2022 PCP: [name], NP   Date of Service: the patient was seen and examined on 05/30/2023  Brief Narrative:  [name] is a 57 y.o. male with a history of heart failure, CAD, right BKA, COPD, hyperlipidemia, hyperlipidemia, polyneuropathy, schizoaffective/bipolar disorder, was admitted to the hospital with left lower extremity cellulitis and gangrene.  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       During hospitalization, patient was also noted to have maxillary abscess and periapical abscess and underwent oral surgery with I&D and of anterior maxillary abscess and surgical extraction of multiple teeth in alveoloplasty. Hospital stay prolonged due to difficulty in placement due to homelessness status status post left BKA and right AKA.   [facility],  have offered to provide a prosthesis for LLE (the BKA)    The patient scheduled an appointment Monday (June 3rd, 2024) at noon to meet up with patient and the Prosthetist to cast the L residual limb and make a plan and patient to receive a prosthesis for his left leg pro bono.   **Patient met with the Prosthetist for his Left Leg who brought initial test socket and will bring another test socket this week (05/18/23)and the patient is to wear the shrinker at all times except when bathing and can wear the shrinker with 2 layers to above his knee or with 1 layer above the knee in 3 to 4 layers more distally to apply more pressure for distal limb shaping.  Per the physical therapist he is to wear the liner during waking hours with initially starting 2 to 3 hours aware and then adding half an hour of wear each day until the patient gets used to wearing the liner.   Still has a difficult disposition given housing instability but CSW is involved.    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 Patient initially was placed on empiric IV antibiotics of vancomycin, cefepime and Flagyl. Underwent left BKA 12/10/2022. Pt received left limb prosthesis yesterday. -PT also recommending possible prosthesis for right leg and recommended getting an x-ray to evaluate his anatomy which showed interval right lower extremity amputation at the knee joint.  Hip joint alignment maintained without dislocation.  Moderate degenerative changes of the right hip joint.  No erosion or periosteal elevation.  Soft tissue thickening at the distal stump.  No soft tissue gas. -Lovenox for dvt ppx   Housing instability  Patient noted to have been stable and awaiting disposition -Patient told me that he has established long-term housing at [name] and will probably be moving in next week.  Anterior maxillary abscess Seen by oral surgeon, Dr. [name]. Patient is s/p I&D of anterior maxillary abscess, surgical extraction of teeth 4,5,6 ,11,12, 13, 20, 21, 22, 27, 28, 29. Simple extraction teeth # 7, 8, 9, 10, 23, 24, 25, 26. Status post angioplasty x 4 quadrants on 03/25/2023. S/p 7 days of Augmentin. -Continue regular diet. -Continue oral hygiene and continue benzocaine mucosal gel for the mouth and throat 4 times daily as needed for mild pain.   Unilateral hearing loss in the setting of cerumen impaction Otoscopic exam revealed severely impacted ear. Prior physician discussed case with Dr. Jenne Pane ENT who recommended that patient benefit from outpatient evaluation. Recommended continued flushing of the ears and continue Debrox and adding  Ciprodex if necessary. -Continue Debrox to both ears.   Chronic combined systolic diastolic CHF Compensated. 2D echo done 08/2022 with a EF of 40% with global hypokinesis. -Continue Coreg, hydralazine, Imdur, Lasix, Norvasc.    Hypertension BP 145/75 -Continue Coreg, Imdur, hydralazine, Norvasc and  Lasix.    COPD Stable. -Continue Xopenex as needed.   Anxiety Stable. -Continue Hydroxyzine as needed.   GERD -Patient refused PPI early on during the hospitalization.    Constipation -Continue bowel regimen of MiraLAX and Dulcolax as needed.     Chronic pain -Continue OxyContin 30 mg twice daily. -Continue oxycodone 20 mg every 4 hours as needed  -Continue Tramadol 50mg  Q12HPRN    Shoulder pain -Continue lidocaine patch and camphor topical.    Schizoaffective disorder -Continue Abilify, Lexapro.     Obesity BMI 29.3 kg/m. -Lifestyle modification -Outpatient follow-up.  Subjective:  Pt had left leg prosthesis. Doing well with it. No concerns this morning.   Physical Exam:  Vitals:   05/29/23 0808 05/29/23 1356 05/29/23 1920 05/30/23 0445  BP: (!) 155/87 137/86 129/77 (!) 147/87  Pulse: 70 73 76 73  Resp: 17 17 18 18   Temp: 97.7 F (36.5 C) 97.8 F (36.6 C) 98.4 F (36.9 C)   TempSrc: Oral Oral Oral   SpO2: 100% 98% 91% 95%  Weight:      Height:       General: Alert, no acute distress, well appearing, moving around the unit with his wheelchair Cardio: well perfused  Pulm: normal work of breathing Extremities: bilateral BKA Neuro: Cranial nerves grossly intact  Data Reviewed:  I have personally reviewed and interpreted labs, imaging.  CBC: No results for input(s): "WBC", "NEUTROABS", "HGB", "HCT", "MCV", "PLT" in the last 168 hours.  Basic Metabolic Panel: No results for input(s): "NA", "K", "CL", "CO2", "GLUCOSE", "BUN", "CREATININE", "CALCIUM", "MG", "PHOS" in the last 168 hours.  GFR: Estimated Creatinine Clearance: 94.4 mL/min (by C-G formula based on SCr of 0.98 mg/dL). Liver Function Tests: No results for input(s): "AST", "ALT", "ALKPHOS", "BILITOT", "PROT", "ALBUMIN" in the last 168 hours.  Coagulation Profile: No results for input(s): "INR", "PROTIME" in the last 168 hours.   Code Status:  Full code.  Code status decision has been  confirmed with: patient  Family Communication:    Severity of Illness:  The appropriate patient status for this patient is INPATIENT. Inpatient status is judged to be reasonable and necessary in order to provide the required intensity of service to ensure the patient's safety. The patient's presenting symptoms, physical exam findings, and initial radiographic and laboratory data in the context of their chronic comorbidities is felt to place them at high risk for further clinical deterioration. Furthermore, it is not anticipated that the patient will be medically stable for discharge from the hospital within 2 midnights of admission.   * I certify that at the point of admission it is my clinical judgment that the patient will require inpatient hospital care spanning beyond 2 midnights from the point of admission due to high intensity of service, high risk for further deterioration and high frequency of surveillance required.*  Author:  Rolm Gala MD  05/30/2023 7:40 AM

## 2023-05-31 DIAGNOSIS — I96 Gangrene, not elsewhere classified: Secondary | ICD-10-CM | POA: Diagnosis not present

## 2023-05-31 NOTE — Progress Notes (Signed)
Phlebotomy came to do lab work at 0400.  Patient became irate and refused because he was woken up from sleep.   Patient stated "they know I am sleeping at this time.  They need to come back later"!  I told patient that I will put a note on the door for lab to come after 8 am.  Patient is in agreement with this suggestion.

## 2023-05-31 NOTE — Progress Notes (Signed)
PROGRESS NOTE   [name]  ZOX:[id number] DOB: Dec 04, 1965 DOA: 12/07/2022 PCP: [name] Sharps, NP   Date of Service: the patient was seen and examined on 05/31/2023  Brief Narrative:  [name] is a 57 y.o. male with a history of heart failure, CAD, right BKA, COPD, hyperlipidemia, hyperlipidemia, polyneuropathy, schizoaffective/bipolar disorder, was admitted to the hospital with left lower extremity cellulitis and gangrene.  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       During hospitalization, patient was also noted to have maxillary abscess and periapical abscess and underwent oral surgery with I&D and of anterior maxillary abscess and surgical extraction of multiple teeth in alveoloplasty. Hospital stay prolonged due to difficulty in placement due to homelessness status status post left BKA and right AKA.   [facility],  have offered to provide a prosthesis for LLE (the BKA)    The patient scheduled an appointment Monday (June 3rd, 2024) at noon to meet up with patient and the Prosthetist to cast the L residual limb and make a plan and patient to receive a prosthesis for his left leg pro bono.   **Patient met with the Prosthetist for his Left Leg who brought initial test socket and will bring another test socket this week (05/18/23)and the patient is to wear the shrinker at all times except when bathing and can wear the shrinker with 2 layers to above his knee or with 1 layer above the knee in 3 to 4 layers more distally to apply more pressure for distal limb shaping.  Per the physical therapist he is to wear the liner during waking hours with initially starting 2 to 3 hours aware and then adding half an hour of wear each day until the patient gets used to wearing the liner.   Still has a difficult disposition given housing instability but CSW is involved.    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 Patient initially was placed on empiric IV antibiotics of vancomycin, cefepime and Flagyl. Underwent left BKA 12/10/2022. Pt received left limb prosthesis yesterday. -PT also recommending possible prosthesis for right leg and recommended getting an x-ray to evaluate his anatomy which showed interval right lower extremity amputation at the knee joint.  Hip joint alignment maintained without dislocation.  Moderate degenerative changes of the right hip joint.  No erosion or periosteal elevation.  Soft tissue thickening at the distal stump.  No soft tissue gas. -Lovenox for dvt ppx   Housing instability  -Patient told me that he has established long-term housing at [name] and will probably be moving in the week beginning 06/01/23  Anterior maxillary abscess Seen by oral surgeon, Dr. [name]. Patient is s/p I&D of anterior maxillary abscess, surgical extraction of teeth 4,5,6 ,11,12, 13, 20, 21, 22, 27, 28, 29. Simple extraction teeth # 7, 8, 9, 10, 23, 24, 25, 26. Status post angioplasty x 4 quadrants on 03/25/2023. S/p 7 days of Augmentin. -Continue regular diet. -Continue oral hygiene and continue benzocaine mucosal gel for the mouth and throat 4 times daily as needed for mild pain.   Unilateral hearing loss in the setting of cerumen impaction Otoscopic exam revealed severely impacted ear. Prior physician discussed case with Dr. Jenne Pane ENT who recommended that patient benefit from outpatient evaluation. Recommended continued flushing of the ears and continue Debrox and adding Ciprodex if necessary. -Continue Debrox to both  ears.   Chronic combined systolic diastolic CHF Compensated. 2D echo done 08/2022 with a EF of 40% with global hypokinesis. -Continue Coreg, hydralazine, Imdur, Lasix, Norvasc.    Hypertension BP 134/92 -Continue Coreg, Imdur, hydralazine, Norvasc and Lasix.    COPD Stable. -Continue Xopenex  as needed.   Anxiety Stable. -Continue Hydroxyzine as needed.   GERD -Patient refused PPI early on during the hospitalization.    Constipation -Continue bowel regimen of MiraLAX and Dulcolax as needed.     Chronic pain -Continue OxyContin 30 mg twice daily. -Continue oxycodone 20 mg every 4 hours as needed  -Continue Tramadol 50mg  Q12HPRN    Shoulder pain -Continue lidocaine patch and camphor topical.    Schizoaffective disorder -Continue Abilify, Lexapro.     Obesity BMI 29.3 kg/m. -Lifestyle modification -Outpatient follow-up.  Subjective:  Doing well no concerns. Using the prosthesis as prescribed.  Physical Exam:  Vitals:   05/30/23 0820 05/30/23 1503 05/30/23 2107 05/31/23 0444  BP: (!) 140/75 (!) 167/99 (!) 143/91 (!) 134/92  Pulse: (!) 58 (!) 101 85 71  Resp: 16 18 18 18   Temp: 97.8 F (36.6 C) 98.2 F (36.8 C) 98.7 F (37.1 C) 97.8 F (36.6 C)  TempSrc: Oral Oral Oral Oral  SpO2: 100% 100% 95% 100%  Weight:      Height:       General: Alert, no acute distress, well appearing, moving around the unit with his wheelchair Cardio: well perfused  Pulm: normal work of breathing Extremities: bilateral BKA Neuro: Cranial nerves grossly intact  Data Reviewed:  I have personally reviewed and interpreted labs, imaging.  CBC: No results for input(s): "WBC", "NEUTROABS", "HGB", "HCT", "MCV", "PLT" in the last 168 hours.  Basic Metabolic Panel: No results for input(s): "NA", "K", "CL", "CO2", "GLUCOSE", "BUN", "CREATININE", "CALCIUM", "MG", "PHOS" in the last 168 hours.  GFR: Estimated Creatinine Clearance: 94.4 mL/min (by C-G formula based on SCr of 0.98 mg/dL). Liver Function Tests: No results for input(s): "AST", "ALT", "ALKPHOS", "BILITOT", "PROT", "ALBUMIN" in the last 168 hours.  Coagulation Profile: No results for input(s): "INR", "PROTIME" in the last 168 hours.   Code Status:  Full code.  Code status decision has been confirmed with:  patient  Family Communication:    Severity of Illness:  The appropriate patient status for this patient is INPATIENT. Inpatient status is judged to be reasonable and necessary in order to provide the required intensity of service to ensure the patient's safety. The patient's presenting symptoms, physical exam findings, and initial radiographic and laboratory data in the context of their chronic comorbidities is felt to place them at high risk for further clinical deterioration. Furthermore, it is not anticipated that the patient will be medically stable for discharge from the hospital within 2 midnights of admission.   * I certify that at the point of admission it is my clinical judgment that the patient will require inpatient hospital care spanning beyond 2 midnights from the point of admission due to high intensity of service, high risk for further deterioration and high frequency of surveillance required.*  Author:  Rolm Gala MD  05/31/2023 7:59 AM

## 2023-06-01 DIAGNOSIS — K219 Gastro-esophageal reflux disease without esophagitis: Secondary | ICD-10-CM | POA: Diagnosis present

## 2023-06-01 DIAGNOSIS — K047 Periapical abscess without sinus: Secondary | ICD-10-CM | POA: Diagnosis present

## 2023-06-01 DIAGNOSIS — F1010 Alcohol abuse, uncomplicated: Secondary | ICD-10-CM | POA: Diagnosis present

## 2023-06-01 DIAGNOSIS — Z5900 Homelessness unspecified: Secondary | ICD-10-CM | POA: Diagnosis not present

## 2023-06-01 DIAGNOSIS — E785 Hyperlipidemia, unspecified: Secondary | ICD-10-CM | POA: Diagnosis present

## 2023-06-01 DIAGNOSIS — A419 Sepsis, unspecified organism: Secondary | ICD-10-CM | POA: Diagnosis present

## 2023-06-01 DIAGNOSIS — F250 Schizoaffective disorder, bipolar type: Secondary | ICD-10-CM | POA: Diagnosis present

## 2023-06-01 DIAGNOSIS — E871 Hypo-osmolality and hyponatremia: Secondary | ICD-10-CM | POA: Diagnosis present

## 2023-06-01 DIAGNOSIS — F419 Anxiety disorder, unspecified: Secondary | ICD-10-CM | POA: Diagnosis present

## 2023-06-01 DIAGNOSIS — I110 Hypertensive heart disease with heart failure: Secondary | ICD-10-CM | POA: Diagnosis present

## 2023-06-01 DIAGNOSIS — F329 Major depressive disorder, single episode, unspecified: Secondary | ICD-10-CM | POA: Diagnosis present

## 2023-06-01 DIAGNOSIS — J449 Chronic obstructive pulmonary disease, unspecified: Secondary | ICD-10-CM | POA: Diagnosis present

## 2023-06-01 DIAGNOSIS — F1410 Cocaine abuse, uncomplicated: Secondary | ICD-10-CM | POA: Diagnosis present

## 2023-06-01 DIAGNOSIS — L97529 Non-pressure chronic ulcer of other part of left foot with unspecified severity: Secondary | ICD-10-CM | POA: Diagnosis present

## 2023-06-01 DIAGNOSIS — F17210 Nicotine dependence, cigarettes, uncomplicated: Secondary | ICD-10-CM | POA: Diagnosis present

## 2023-06-01 DIAGNOSIS — L02612 Cutaneous abscess of left foot: Secondary | ICD-10-CM | POA: Diagnosis present

## 2023-06-01 DIAGNOSIS — E669 Obesity, unspecified: Secondary | ICD-10-CM | POA: Diagnosis present

## 2023-06-01 DIAGNOSIS — L03116 Cellulitis of left lower limb: Secondary | ICD-10-CM | POA: Diagnosis present

## 2023-06-01 DIAGNOSIS — E11621 Type 2 diabetes mellitus with foot ulcer: Secondary | ICD-10-CM | POA: Diagnosis present

## 2023-06-01 DIAGNOSIS — I5043 Acute on chronic combined systolic (congestive) and diastolic (congestive) heart failure: Secondary | ICD-10-CM | POA: Diagnosis present

## 2023-06-01 DIAGNOSIS — E1152 Type 2 diabetes mellitus with diabetic peripheral angiopathy with gangrene: Secondary | ICD-10-CM | POA: Diagnosis present

## 2023-06-01 DIAGNOSIS — I96 Gangrene, not elsewhere classified: Secondary | ICD-10-CM | POA: Diagnosis not present

## 2023-06-01 DIAGNOSIS — B370 Candidal stomatitis: Secondary | ICD-10-CM | POA: Diagnosis not present

## 2023-06-01 DIAGNOSIS — Z89611 Acquired absence of right leg above knee: Secondary | ICD-10-CM | POA: Diagnosis not present

## 2023-06-01 DIAGNOSIS — E1142 Type 2 diabetes mellitus with diabetic polyneuropathy: Secondary | ICD-10-CM | POA: Diagnosis present

## 2023-06-01 NOTE — Progress Notes (Signed)
PROGRESS NOTE   [name]  WUJ:[id number] DOB: 31-Jul-1966 DOA: 12/07/2022 PCP: [name], NP   Date of Service: the patient was seen and examined on 06/01/2023  Brief Narrative:  [name] is a 57 y.o. male with a history of heart failure, CAD, right BKA, COPD, hyperlipidemia, hyperlipidemia, polyneuropathy, schizoaffective/bipolar disorder, was admitted to the hospital with left lower extremity cellulitis and gangrene.  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       During hospitalization, patient was also noted to have maxillary abscess and periapical abscess and underwent oral surgery with I&D and of anterior maxillary abscess and surgical extraction of multiple teeth in alveoloplasty. Hospital stay prolonged due to difficulty in placement due to homelessness status status post left BKA and right AKA.   [facility],  have offered to provide a prosthesis for LLE (the BKA)    The patient scheduled an appointment Monday (June 3rd, 2024) at noon to meet up with patient and the Prosthetist to cast the L residual limb and make a plan and patient to receive a prosthesis for his left leg pro bono.   **Patient met with the Prosthetist for his Left Leg who brought initial test socket and will bring another test socket this week (05/18/23)and the patient is to wear the shrinker at all times except when bathing and can wear the shrinker with 2 layers to above his knee or with 1 layer above the knee in 3 to 4 layers more distally to apply more pressure for distal limb shaping.  Per the physical therapist he is to wear the liner during waking hours with initially starting 2 to 3 hours aware and then adding half an hour of wear each day until the patient gets used to wearing the liner.   Still has a difficult disposition given housing instability but CSW is involved.  Appears to have a place starting this week-- CSW to let us know when he  can d/c.    Assessment and Plan:  Sepsis secondary to cellulitis/gangrene of left foot status post left BKA Prior history of right BKA, wheelchair-bound. Patient initially was placed on empiric IV antibiotics of vancomycin, cefepime and Flagyl. Underwent left BKA 12/10/2022. Pt received left limb prosthesis yesterday. -PT also recommending possible prosthesis for right leg and recommended getting an x-ray to evaluate his anatomy which showed interval right lower extremity amputation at the knee joint.  Hip joint alignment maintained without dislocation.  Moderate degenerative changes of the right hip joint.  No erosion or periosteal elevation.  Soft tissue thickening at the distal stump.  No soft tissue gas. -Lovenox for dvt ppx   Housing instability  -Patient told me that he has established long-term housing at [name] and will probably be moving in the week beginning 06/01/23  Anterior maxillary abscess Seen by oral surgeon, Dr. [name]. Patient is s/p I&D of anterior maxillary abscess, surgical extraction of teeth 4,5,6 ,11,12, 13, 20, 21, 22, 27, 28, 29. Simple extraction teeth # 7, 8, 9, 10, 23, 24, 25, 26. Status post angioplasty x 4 quadrants on 03/25/2023. S/p 7 days of Augmentin. -Continue regular diet. -Continue oral hygiene and continue benzocaine mucosal gel for the mouth and throat 4 times daily as needed for mild pain.   Unilateral hearing loss in the setting of cerumen impaction Otoscopic exam revealed severely impacted ear. Prior physician discussed case with Dr. Jenne Pane ENT who recommended that patient benefit from outpatient evaluation.  Recommended continued flushing of the ears and continue Debrox and adding Ciprodex if necessary. -Continue Debrox to both ears.   Chronic combined systolic diastolic CHF Compensated. 2D echo done 08/2022 with a EF of 40% with global hypokinesis. -Continue Coreg, hydralazine, Imdur, Lasix, Norvasc.    Hypertension BP 134/92 -Continue  Coreg, Imdur, hydralazine, Norvasc and Lasix.    COPD Stable. -Continue Xopenex as needed.   Anxiety Stable. -Continue Hydroxyzine as needed.   GERD -Patient refused PPI early on during the hospitalization.    Constipation -Continue bowel regimen of MiraLAX and Dulcolax as needed.     Chronic pain -Continue OxyContin 30 mg twice daily. -Continue oxycodone 20 mg every 4 hours as needed  -Continue Tramadol 50mg  Q12HPRN    Shoulder pain -Continue lidocaine patch and camphor topical.    Schizoaffective disorder -Continue Abilify, Lexapro.     Obesity BMI 29.3 kg/m. -Lifestyle modification -Outpatient follow-up.  Subjective:  Moving around the hospital in his wheelchair  Physical Exam:  Vitals:   05/30/23 2107 05/31/23 0444 05/31/23 0824 06/01/23 0749  BP: (!) 143/91 (!) 134/92 (!) 131/93 133/83  Pulse: 85 71 73 69  Resp: 18 18 16 18   Temp: 98.7 F (37.1 C) 97.8 F (36.6 C) 98 F (36.7 C) 98.2 F (36.8 C)  TempSrc: Oral Oral Oral Oral  SpO2: 95% 100% 100% 100%  Weight:      Height:        General: Appearance:     Overweight male in no acute distress     Lungs:     respirations unlabored  Heart:    Normal heart rate. Normal rhythm. No murmurs, rubs, or gallops.      Neurologic:   Awake, alert     Data Reviewed:  I have personally reviewed and interpreted labs, imaging.  CBC: No results for input(s): "WBC", "NEUTROABS", "HGB", "HCT", "MCV", "PLT" in the last 168 hours.  Basic Metabolic Panel: No results for input(s): "NA", "K", "CL", "CO2", "GLUCOSE", "BUN", "CREATININE", "CALCIUM", "MG", "PHOS" in the last 168 hours.  GFR: Estimated Creatinine Clearance: 94.4 mL/min (by C-G formula based on SCr of 0.98 mg/dL). Liver Function Tests: No results for input(s): "AST", "ALT", "ALKPHOS", "BILITOT", "PROT", "ALBUMIN" in the last 168 hours.  Coagulation Profile: No results for input(s): "INR", "PROTIME" in the last 168 hours.   Code Status:  Full  code.    Author:  Joseph Art DO  06/01/2023 9:15 AM

## 2023-06-01 NOTE — Progress Notes (Signed)
Physical Therapy Treatment Patient Details Name: [name] MRN: [id number] DOB: Apr 09, 1966 Today's Date: 06/01/2023   History of Present Illness Pt is a 57 y/o M who presented 12/17/22 with L ankle cellulitis and underwent L BKA on 1/10. Oral abscesses noted, underwent oral surgery Wed, 4/24; Initiated fabrication of prosthesis for LLE on 6/3; PMH includes asthma, bioplar 1, depression, schizoaffective disorder, CVA, chronic CHF, homelessness, HTN, DM2, cocaine and cannabis use, and R knee disarticulation    PT Comments  Continuing work on functional mobility and activity tolerance, and prepare for DC to independence in pt's own apartment; Session focused on looking ahead to next steps -- to being independent in his apartmetn, and to prosthesis training; This PT demonstrated what to work on with [name] in parallel bars, with RW, and eventually perhaps with crutches; Emphasized to [name] that while he is still getting used to prosthesis wear, the goals are largely on using the L leg for functional transfers, and more complex functional transfers (like car transfers, floor transfers); as he becomes more used to the L prosthesis, progressing to stand pivot transfers and taking steps with RW will be the goals; [name] voiced understanding;  Notable that [name], [name] with [facility], plans to follow and support [name] with his LLE prosthesis, as well as fcast him for the RLE, and start fitting and training for a prostehsis for the R knee disarticulation amputation; [facility], [place] location, number is [phone];   At end of session, participated in the meeting with patient, [name] staff, and [name], SW with [name]; Updated participants in [name]' functional status, next steps in prosthesis training, and equipment recommendations for the apartment.    Assistance Recommended at Discharge PRN  If plan is discharge home, recommend the following:  Can travel by private vehicle    Assist for  transportation   Yes (Currently recommend a sliding board for car transfers; may not need once he gets L prosthesis)  Equipment Recommendations  Other (comment) (tub bench, sliding board)    Recommendations for Other Services  (Recommend starting the Mental/Behavioral Health support through [name] to start while [name] is here in the hospital; [name] consult; OT consult)     Precautions / Restrictions Precautions Precautions: None Precaution Comments: R knee disrticulation, L BKA; Fall risk is present, but minimal Restrictions Weight Bearing Restrictions: No RLE Weight Bearing: Weight bearing as tolerated LLE Weight Bearing: Weight bearing as tolerated     Mobility  Bed Mobility                 Patient Response: Cooperative  Transfers Overall transfer level:  (Modified independent with wc transfers)                      Ambulation/Gait               General Gait Details: Unable   Stairs             Wheelchair Mobility     Tilt Bed Tilt Bed Patient Response: Cooperative  Modified Rankin (Stroke Patients Only)       Balance     Sitting balance-Leahy Scale: Normal       Standing balance-Leahy Scale: Poor                              Cognition Arousal/Alertness: Awake/alert Behavior During Therapy: WFL for tasks assessed/performed Overall Cognitive Status: Within Functional Limits for tasks assessed (  for simple mobility tasks)                                 General Comments: When [name] understands rationale for an exercise or activity, and that rationale is in line with his goals, he participates much better        Exercises Amputee Exercises Hip Extension:  (This PT demonstrated supine hip extension stretches with LEs hanging off of mat table)    General Comments General comments (skin integrity, edema, etc.): Took time to describe and demonstrate typical progression with prosthesis  training; Worht taking  the time to describe and demonstrate next steps to give [name] an idea of what to expect with next sessions here, and with HH therapies, followed by outpt PT      Pertinent Vitals/Pain Pain Assessment Faces Pain Scale: Hurts little more Pain Location: L distal residual limb with weight bearing in initial prosthesis socket trial Pain Descriptors / Indicators: Grimacing    Home Living Family/patient expects to be discharged to:: Shelter/Homeless                   Additional Comments: Approved for apartment at [name] via [name]    Prior Function            PT Goals (current goals can now be found in the care plan section) Acute Rehab PT Goals Patient Stated Goal: Prepare for prosthetics fitting; Would like to get housing in [name] through the Transition to [employer] PT Goal Formulation: With patient Time For Goal Achievement: 06/09/23 Potential to Achieve Goals: Good Progress towards PT goals: Progressing toward goals    Frequency    Min 2X/week      PT Plan Current plan remains appropriate;Frequency needs to be updated (When prosthesis training begins, will incr frequency; anticipate will begin 2x/week starting Monday, 6/24)    Co-evaluation              AM-PAC PT "6 Clicks" Mobility   Outcome Measure  Help needed turning from your back to your side while in a flat bed without using bedrails?: None Help needed moving from lying on your back to sitting on the side of a flat bed without using bedrails?: None Help needed moving to and from a bed to a chair (including a wheelchair)?: None Help needed standing up from a chair using your arms (e.g., wheelchair or bedside chair)?: Total Help needed to walk in hospital room?: Total Help needed climbing 3-5 steps with a railing? : Total 6 Click Score: 15    End of Session Equipment Utilized During Treatment: Other (comment);Gait belt (revised  socket provided by [name]) Activity Tolerance: Patient tolerated treatment well Patient left:  (managing independently) Nurse Communication: Mobility status PT Visit Diagnosis: Muscle weakness (generalized) (M62.81);Other abnormalities of gait and mobility (R26.89)     Time: 2956-2130 PT Time Calculation (min) (ACUTE ONLY): 16 min  Charges:    $Self Care/Home Management: 8-22 PT General Charges $$ ACUTE PT VISIT: 1 Visit                     Van Clines, PT  Acute Rehabilitation Services Office [phone] Secure Chat welcomed    [name] 06/01/2023, 7:19 PM

## 2023-06-01 NOTE — Progress Notes (Signed)
Physical Therapy Treatment Patient Details Name: [name] MRN: [id number] DOB: Jun 09, 1966 Today's Date: 06/01/2023   History of Present Illness Pt is a 57 y/o M who presented 12/17/22 with L ankle cellulitis and underwent L BKA on 1/10. Oral abscesses noted, underwent oral surgery Wed, 4/24; Initiated fabrication of prosthesis for LLE on 6/3; PMH includes asthma, bioplar 1, depression, schizoaffective disorder, CVA, chronic CHF, homelessness, HTN, DM2, cocaine and cannabis use, and R knee disarticulation    PT Comments  Continuing work on functional mobility and activity tolerance;  session focused on more practice donning L prosthesis; Problem-solved how to don without physical assist from others, using a wall to brace foot of prosthesis as he pushes into it; Described the use of the stedy to help with standing balance and tolerance; [place] will consider it, and perhaps we will use it to practice standing next session; plan to also perform scoot pivot transfers with prosthesis, and perhaps get to squat pivot or stand pivot transfers    Assistance Recommended at Discharge PRN  If plan is discharge home, recommend the following:  Can travel by private vehicle    Assist for transportation   Yes (Currently recommend a sliding board for car transfers; may not need once he gets L prosthesis)  Equipment Recommendations  Other (comment) (tub bench, sliding board)    Recommendations for Other Services  (Recommend starting the Mental/Behavioral Health support through [name] to start while Liliana is here in the hospital; [name] consult; OT consult)     Precautions / Restrictions Precautions Precautions: None Precaution Comments: R knee disrticulation, L BKA; Fall risk is present, but minimal Restrictions Weight Bearing Restrictions: No RLE Weight Bearing: Weight bearing as tolerated LLE Weight Bearing: Weight bearing as tolerated     Mobility  Bed Mobility                  Patient Response: Cooperative  Transfers Overall transfer level:  (Modified independent with wc transfers)                      Ambulation/Gait               General Gait Details: Unable   Stairs             Wheelchair Mobility     Tilt Bed Tilt Bed Patient Response: Cooperative  Modified Rankin (Stroke Patients Only)       Balance     Sitting balance-Leahy Scale: Normal       Standing balance-Leahy Scale: Poor                              Cognition Arousal/Alertness: Awake/alert Behavior During Therapy: WFL for tasks assessed/performed Overall Cognitive Status: Within Functional Limits for tasks assessed (for simple mobility tasks)                                 General Comments: When [place] understands rationale for an exercise or activity, and that rationale is in line with his goals, he participates much better        Exercises Amputee Exercises Hip Extension:  (This PT demonstrated supine hip extension stretches with LEs hanging off of mat table)    General Comments General comments (skin integrity, edema, etc.): Reviewed initial prostehsis wear for getting used to the feel and weigh to fthe prosthesis  Pertinent Vitals/Pain Pain Assessment Pain Assessment: No/denies pain Faces Pain Scale: Hurts little more Pain Location: L distal residual limb with weight bearing in initial prosthesis socket trial Pain Descriptors / Indicators: Grimacing    Home Living Family/patient expects to be discharged to:: Shelter/Homeless                   Additional Comments: Approved for apartment at [facility] via [employer]    Prior Function            PT Goals (current goals can now be found in the care plan section) Acute Rehab PT Goals Patient Stated Goal: Prepare for prosthetics fitting; Would like to get housing in [facility] through the Transition to [employer] PT Goal Formulation: With patient Time For Goal Achievement: 06/09/23 Potential to Achieve Goals: Good Progress towards PT goals: Progressing toward goals    Frequency    Min 2X/week      PT Plan Current plan remains appropriate;Discharge plan needs to be updated    Co-evaluation              AM-PAC PT "6 Clicks" Mobility   Outcome Measure  Help needed turning from your back to your side while in a flat bed without using bedrails?: None Help needed moving from lying on your back to sitting on the side of a flat bed without using bedrails?: None Help needed moving to and from a bed to a chair (including a wheelchair)?: None Help needed standing up from a chair using your arms (e.g., wheelchair or bedside chair)?: Total Help needed to walk in hospital room?: Total Help needed climbing 3-5 steps with a railing? : Total 6 Click Score: 15    End of Session Equipment Utilized During Treatment: Other (comment);Gait belt (revised socket provided by [facility]) Activity Tolerance: Patient tolerated treatment well Patient left:  (managing independently) Nurse Communication: Mobility status PT Visit Diagnosis: Muscle weakness (generalized) (M62.81);Other abnormalities of gait and mobility (R26.89)     Time: 1610-9604 PT Time Calculation (min) (ACUTE ONLY): 24 min  Charges:    $Self Care/Home Management: 8-22 $Prosthetics Training: 8-59mins PT General Charges $$ ACUTE PT VISIT: 1 Visit                     Van Clines, PT  Acute Rehabilitation Services Office [phone] Secure Chat welcomed    [name] 06/01/2023, 7:39 PM

## 2023-06-01 NOTE — Plan of Care (Signed)
  Problem: Pain Managment: Goal: General experience of comfort will improve Outcome: Progressing   Problem: Safety: Goal: Ability to remain free from injury will improve Outcome: Progressing   Problem: Skin Integrity: Goal: Risk for impaired skin integrity will decrease Outcome: Progressing   Problem: Activity: Goal: Risk for activity intolerance will decrease Outcome: Progressing   

## 2023-06-01 NOTE — TOC Progression Note (Addendum)
Transition of Care [facility]) - Progression Note    Patient Details  Name: [name] MRN: [id number] Date of Birth: [birth year]  [facility]) CM/SW Contact  [name], [name] Phone Number: 06/01/2023, 8:59 AM  Clinical Narrative:    2:15pm: CSW spoke with [name] who states patient is required to be present at the time his check is being cashed. [name] will meet patient at [facility] on [address] tomorrow at [employer].  CSW spoke with MD who is agreeable for patient to be transported to the bank and back to the hospital. CSW will accompany patient during transport.  CSW spoke with at [employer] who states they will be able to assist with transportation. Patient will be picked up tomorrow at 2:50pm from the [address] main entrance by [employer]. The transport company will pick patient up and return him to the hospital once done at [facility].  12pm: CSW spoke with Tresa Endo at [place] who states the agency can provide patient with [facility] PT, OT, and aide. CSW will had HH order adjusted next week to add RN for medication management.  CSW spoke with Tia at [facility] at [facility] to schedule patient a PCP appointment - appointment scheduled for 06/23/23 at 2pm.  CSW spoke with Diplomatic Services operational officer at [facility] on [address] to schedule patient a new intake appointment with a dentist - patient's appointment is 06/15/23 at 9:30am.   11am: CSW participated in meeting with patient, [facility] staff and [employer].   The expected discharge date is 06/11/23.  CSW spoke with MD to request HH orders be placed for PT, OT, RN, and aide.   CSW will obtain signed PCS form, home health, medications, PCP / dental / optometry appointments all before discharge. CSW will apply for Medicaid transportation as well.  8:30am: CSW spoke with [name] of the [place] [facility] who states she will be at patient's bedside by 11am for the planned meeting today. CSW will  be present as well to assist with any planning.  CSW spoke with Anice Paganini, OT from [place] states she is going to visit patient's new apartment today to inspect it and ensure patient is able to live there independently. Kristen requested updated clinicals from therapy team - CSW sent clinicals via secure email.  Expected Discharge Plan: Home/Self Care Barriers to Discharge: Homeless with medical needs  Expected Discharge Plan and Services   Discharge Planning Services: CM Consult Post Acute Care Choice: Home Health Living arrangements for the past 2 months: Homeless                                       Social Determinants of Health (SDOH) Interventions SDOH Screenings   Food Insecurity: Food Insecurity Present (11/24/2022)  Housing: Medium Risk (12/07/2022)  Transportation Needs: Unmet Transportation Needs (12/07/2022)  Utilities: Not At Risk (12/07/2022)  Alcohol Screen: Medium Risk (06/24/2019)  Depression (PHQ2-9): Medium Risk (02/10/2021)  Tobacco Use: High Risk (03/26/2023)    Readmission Risk Interventions    12/09/2022    9:02 AM 12/01/2022    3:56 PM 08/18/2022    9:00 AM  Readmission Risk Prevention Plan  Transportation Screening Complete Complete Complete  Medication Review Oceanographer) Complete Complete Complete  PCP or Specialist appointment within 3-5 days of discharge Complete Complete Complete  HRI or Home Care Consult Complete Complete Complete  SW  Recovery Care/Counseling Consult Complete Complete Complete  Palliative Care Screening Not Applicable Not Applicable Not Applicable  Skilled Nursing Facility Not Applicable Not Applicable Not Applicable  SNF Comments  n/a

## 2023-06-01 NOTE — Evaluation (Signed)
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 Oral abscesses noted, underwent oral surgery Wed, 4/24; Initiated fabrication of prosthesis for LLE on 6/3; PMH includes asthma, bioplar 1, depression, schizoaffective disorder, CVA, chronic CHF, homelessness, HTN, DM2, cocaine and cannabis use, and R knee disarticulation   Clinical Impression   Pt currently in process of learning how to don prosthesis and shrinker, has challenges due to RUE tremors.  Pt currently at Mod I level for most bADLs but now needs assistance with managing new prosthetic, he presents with RUE deficits (listed below) which impact his GMC and [facility] skills needed to independently complete the tasks. Pt noted to have strong pulsating contractions of wrist extensors during RUE movement which tend to subside with stabilization and relaxation. Educated and instructed pt on muscle isolation exercises. Pt would benefit from continued acute skilled OT services to address deficits and help transition to next level of care. Recommend pt continue with [name] support resources to procure independent housing.      Recommendations for follow up therapy are one component of a multi-disciplinary discharge planning process, led by the attending physician.  Recommendations may be updated based on patient status, additional functional criteria and insurance authorization.   Assistance Recommended at Discharge Intermittent Supervision/Assistance  Patient can return home with the following A little help with walking and/or transfers    Functional Status Assessment  Patient has had a recent decline in their functional status and demonstrates the ability to make significant improvements in function in a reasonable and predictable amount of time.   Equipment Recommendations  Other (comment) (pending further assessment)    Recommendations for Other Services       Precautions / Restrictions Precautions Precautions: None Precaution Comments: R knee disrticulation, L BKA; Fall risk is present, but minimal Restrictions Weight Bearing Restrictions: No RLE Weight Bearing: Weight bearing as tolerated LLE Weight Bearing: Weight bearing as tolerated             ADL either performed or assessed with clinical judgement   ADL             General ADL Comments: Pt completing bADLs with Mod I using w/c to ambulate in halls, at new functional baseline at this time but will reassess functioning with prosthesis once able to. Focused session on addressing RUE deficits which prevent patient from completing needed tasks independently      Pertinent Vitals/Pain Pain Assessment Pain Assessment: No/denies pain     Hand Dominance Right   Extremity/Trunk Assessment Upper Extremity Assessment RUE Deficits / Details: RUE experiencing residual tremors from prior CVA, wrist extensors noted to contract and pulsate when pt performs functional tasks which can cease by stabilizing pt at wrist and elbow then applying deep pressure to wrist flexors. LUE Deficits / Details: WFL           Communication Communication Communication: Expressive difficulties   Cognition Arousal/Alertness: Awake/alert Behavior During Therapy: WFL for tasks assessed/performed Overall Cognitive Status: Within Functional Limits for tasks assessed                                       General Comments  Pt instructed on muscle isolation techniques with empahsis on mind muscle connection to  isolate contractions in RUE    Exercises Other Exercises Other Exercises: Squeez[place]all with R wrist flex/ext contraction isolations Other Exercises: Pink theraputty with finger opposition   Shoulder Instructions      Home Living Family/patient expects to be  discharged to:: Shelter/Homeless                                 Additional Comments: Approved for apartment at [facility] via [employer]      Prior Functioning/Environment Prior Level of Function : Independent/Modified Independent             Mobility Comments: pt remains in w/c even to sleep. Since his CVA he has difficulty with propulsion with RUE and has to ask other people to help. Does not have prosthesis on the right. ADLs Comments: pt reports that he was homeless [place]. He completes all dressing himself in his w/c. Utilizes public restrooms with grab bars to complete toileting. He washes up at the sink.        OT Problem List: Impaired UE functional use      OT Treatment/Interventions: Therapeutic activities;Self-care/ADL training;Therapeutic exercise;Neuromuscular education;Patient/family education;DME and/or AE instruction;Balance training;Manual therapy;Modalities    OT Goals(Current goals can be found in the care plan section) Acute Rehab OT Goals Patient Stated Goal: To get RUE under control OT Goal Formulation: With patient Time For Goal Achievement: 06/15/23 Potential to Achieve Goals: Good ADL Goals Pt Will Perform Lower Body Dressing: sit to/from stand;with min guard assist Pt/caregiver will Perform Home Exercise Program: Right Upper extremity;Independently (Increased GMC/FMC) Additional ADL Goal #1: Pt will don/doff shrinker independently  OT Frequency: Min 3X/week    Co-evaluation              AM-PAC OT "6 Clicks" Daily Activity     Outcome Measure Help from another person eating meals?: None Help from another person taking care of personal grooming?: None Help from another person toileting, which includes using toliet, bedpan, or urinal?: None Help from another person bathing (including washing, rinsing, drying)?: None Help from another person to put on and taking off regular upper body clothing?:  None Help from another person to put on and taking off regular lower body clothing?: None 6 Click Score: 24   End of Session Equipment Utilized During Treatment: Other (comment) (w/c) Nurse Communication: Mobility status  Activity Tolerance: Patient tolerated treatment well Patient left: Other (comment) (seated in wheelchair, as he roams the halls as per usual)  OT Visit Diagnosis: Other symptoms and signs involving the nervous system (R29.898)                Time: 1740-1805 OT Time Calculation (min): 25 min Charges:  OT General Charges $OT Visit: 1 Visit OT Evaluation $OT Eval Low Complexity: 1 Low OT Treatments $Neuromuscular Re-education: 8-22 mins  06/01/2023  AB, OTR/L  Acute Rehabilitation Services  Office: [phone]   Tristan Schroeder 06/01/2023, 6:34 PM

## 2023-06-01 NOTE — Plan of Care (Signed)
  Problem: Pain Managment: Goal: General experience of comfort will improve Outcome: Not Progressing   Problem: Safety: Goal: Ability to remain free from injury will improve Outcome: Not Progressing   Problem: Skin Integrity: Goal: Risk for impaired skin integrity will decrease Outcome: Not Progressing   Problem: Education: Goal: Knowledge of General Education information will improve Description: Including pain rating scale, medication(s)/side effects and non-pharmacologic comfort measures Outcome: Not Progressing   Problem: Health Behavior/Discharge Planning: Goal: Ability to manage health-related needs will improve Outcome: Not Progressing   Problem: Clinical Measurements: Goal: Ability to maintain clinical measurements within normal limits will improve Outcome: Not Progressing Goal: Will remain free from infection Outcome: Not Progressing Goal: Diagnostic test results will improve Outcome: Not Progressing Goal: Respiratory complications will improve Outcome: Not Progressing Goal: Cardiovascular complication will be avoided Outcome: Not Progressing   Problem: Activity: Goal: Risk for activity intolerance will decrease Outcome: Not Progressing   Problem: Nutrition: Goal: Adequate nutrition will be maintained Outcome: Not Progressing   Problem: Coping: Goal: Level of anxiety will decrease Outcome: Not Progressing   Problem: Elimination: Goal: Will not experience complications related to bowel motility Outcome: Not Progressing Goal: Will not experience complications related to urinary retention Outcome: Not Progressing   Problem: Pain Managment: Goal: General experience of comfort will improve Outcome: Not Progressing

## 2023-06-02 DIAGNOSIS — I96 Gangrene, not elsewhere classified: Secondary | ICD-10-CM | POA: Diagnosis not present

## 2023-06-02 NOTE — TOC Progression Note (Addendum)
Transition of Care [facility]) - Progression Note    Patient Details  Name: [name] MRN: [id number] Date of Birth: July 16, 1966  [facility]) CM/SW Contact  [name], [name] Phone Number: 06/02/2023, 8:29 AM  Clinical Narrative:    CSW spoke with patient at bedside to inform him of today's scheduled outing. Patient agreeable and aware of the logistics of the trip. CSW will return to patient's bedside prior to the trip to be present and facilitate trip.  Dr. Benjamine Mola present at bedside briefly during discussion - MD agreeable and aware of plan. MD signed PCS form and CSW submitted it to Elige Ko, RN at [facility] for [employer].  CSW notified Clare Charon, RN of information.   Expected Discharge Plan: Home/Self Care Barriers to Discharge: Homeless with medical needs  Expected Discharge Plan and Services   Discharge Planning Services: CM Consult Post Acute Care Choice: Home Health Living arrangements for the past 2 months: Homeless                                       Social Determinants of Health (SDOH) Interventions SDOH Screenings   Food Insecurity: Food Insecurity Present (11/24/2022)  Housing: Medium Risk (12/07/2022)  Transportation Needs: Unmet Transportation Needs (12/07/2022)  Utilities: Not At Risk (12/07/2022)  Alcohol Screen: Medium Risk (06/24/2019)  Depression (PHQ2-9): Medium Risk (02/10/2021)  Tobacco Use: High Risk (03/26/2023)    Readmission Risk Interventions    12/09/2022    9:02 AM 12/01/2022    3:56 PM 08/18/2022    9:00 AM  Readmission Risk Prevention Plan  Transportation Screening Complete Complete Complete  Medication Review Ocean[employer]) Complete Complete Complete  PCP or Specialist appointment within 3-5 days of discharge Complete Complete Complete  HRI or Home Care Consult Complete Complete Complete  SW Recovery Care/Counseling Consult Complete Complete Complete  Palliative Care Screening Not Applicable Not Applicable Not  Applicable  Skilled Nursing Facility Not Applicable Not Applicable Not Applicable  SNF Comments  n/a

## 2023-06-02 NOTE — Progress Notes (Signed)
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 Oral abscesses noted, underwent oral surgery Wed, 4/24; Initiated fabrication of prosthesis for LLE on 6/3; PMH includes asthma, bioplar 1, depression, schizoaffective disorder, CVA, chronic CHF, homelessness, HTN, DM2, cocaine and cannabis use, and R knee disarticulation    PT Comments  Continuing work on functional mobility and activity tolerance;  Ptolemy stood x3 today on his L prosthesis within the stedy standing assist frame, and was able to stand a little taller tahn previously in the parallel bars; Also focused on problem-solving through donning prosthesis without assist -- will plan to try "clicking it in" by pushing foot against the wall with LLE and prosthesis horizontal to the floor next session    Assistance Recommended at Discharge PRN  If plan is discharge home, recommend the following:  Can travel by private vehicle    Assist for transportation   Yes (Currently recommend a sliding board for car transfers)  Equipment Recommendations  Other (comment) (tub bench, sliding board)    Recommendations for Other Services  (Recommend starting the Mental/Behavioral Health support through [name] to start while Muhammad is here in the hospital; [name] consult; OT consult)     Precautions / Restrictions Precautions Precautions: None Precaution Comments: R knee disrticulation, L BKA; Fall risk is present, but minimal Restrictions RLE Weight Bearing: Weight bearing as tolerated LLE Weight Bearing: Weight bearing as tolerated     Mobility  Bed Mobility                 Patient Response: Cooperative  Transfers Overall transfer level: Needs assistance (Modified independent with wc transfers) Equipment used: Ambulation equipment  used Antony Salmon) Transfers: Sit to/from Stand Sit to Stand: +2 physical assistance, +2 safety/equipment, Min assist, Mod assist           General transfer comment: Stood with bilateral support form wheelchair to [place] standing assist frame; Heavy mod assist from low seat of wheelchair; min assist to come to stand from higher stedy seat; Able to acheive more upright standing today    Ambulation/Gait               General Gait Details: Unable   Stairs             Wheelchair Mobility     Tilt Bed Tilt Bed Patient Response: Cooperative  Modified Rankin (Stroke Patients Only)       Balance     Sitting balance-Leahy Scale: Normal       Standing balance-Leahy Scale: Poor                              Cognition Arousal/Alertness: Awake/alert Behavior During Therapy: WFL for tasks assessed/performed Overall Cognitive Status: Within Functional Limits for tasks assessed (for simple mobility tasks)                                 General Comments: When Baron understands rationale for an exercise or activity, and that rationale is in line with his goals, he participates much better        Exercises      General Comments General comments (skin integrity, edema, etc.): Demarcus observed the prosthetist unscrewing the pin from  one of his liners earlier, and now chooses to store the pin unconnected with his liner instead of leaving it in the liner; We discussed that it may be simpler if he leaves the pin connected; He will consider this      Pertinent Vitals/Pain Pain Assessment Pain Assessment: Faces Faces Pain Scale: Hurts even more Pain Location: L distal residual limb with weight bearing in prosthesis Pain Descriptors / Indicators: Grimacing Pain Intervention(s): Monitored during session, Limited activity within patient's tolerance    Home Living                          Prior Function            PT Goals (current  goals can now be found in the care plan section) Acute Rehab PT Goals Patient Stated Goal: Prepare for prosthetics fitting; Would like to get housing in [facility] through the Transition to [employer] PT Goal Formulation: With patient Time For Goal Achievement: 06/09/23 Potential to Achieve Goals: Good Progress towards PT goals: Progressing toward goals    Frequency    Min 2X/week      PT Plan Current plan remains appropriate;Discharge plan needs to be updated    Co-evaluation              AM-PAC PT "6 Clicks" Mobility   Outcome Measure  Help needed turning from your back to your side while in a flat bed without using bedrails?: None Help needed moving from lying on your back to sitting on the side of a flat bed without using bedrails?: None Help needed moving to and from a bed to a chair (including a wheelchair)?: None Help needed standing up from a chair using your arms (e.g., wheelchair or bedside chair)?: Total Help needed to walk in hospital room?: Total Help needed climbing 3-5 steps with a railing? : Total 6 Click Score: 15    End of Session Equipment Utilized During Treatment: Other (comment);Gait belt (prosthesis) Activity Tolerance: Patient tolerated treatment well Patient left:  (managing independently) Nurse Communication: Mobility status PT Visit Diagnosis: Muscle weakness (generalized) (M62.81);Other abnormalities of gait and mobility (R26.89)     Time: 0933-1000 PT Time Calculation (min) (ACUTE ONLY): 27 min  Charges:    $Therapeutic Activity: 8-22 mins $Self Care/Home Management: 8-22 PT General Charges $$ ACUTE PT VISIT: 1 Visit                     Van Clines, PT  Acute Rehabilitation Services Office [phone] Secure Chat welcomed    [name] 06/02/2023, 1:18 PM

## 2023-06-02 NOTE — Progress Notes (Signed)
PROGRESS NOTE   [name]  UEA:[id number] DOB: Jun 04, 1966 DOA: 12/07/2022 PCP: [name], NP   Date of Service: the patient was seen and examined on 06/02/2023  Brief Narrative:  [name] is a 57 y.o. male with a history of heart failure, CAD, right BKA, COPD, hyperlipidemia, hyperlipidemia, polyneuropathy, schizoaffective/bipolar disorder, was admitted to the hospital with left lower extremity cellulitis and gangrene.  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       During hospitalization, patient was also noted to have maxillary abscess and periapical abscess and underwent oral surgery with I&D and of anterior maxillary abscess and surgical extraction of multiple teeth in alveoloplasty. Hospital stay prolonged due to difficulty in placement due to homelessness status status post left BKA and right AKA.   [facility],  have offered to provide a prosthesis for LLE (the BKA)    The patient scheduled an appointment Monday (June 3rd, 2024) at noon to meet up with patient and the Prosthetist to cast the L residual limb and make a plan and patient to receive a prosthesis for his left leg pro bono.   **Patient met with the Prosthetist for his Left Leg who brought initial test socket and will bring another test socket this week (05/18/23)and the patient is to wear the shrinker at all times except when bathing and can wear the shrinker with 2 layers to above his knee or with 1 layer above the knee in 3 to 4 layers more distally to apply more pressure for distal limb shaping.  Per the physical therapist he is to wear the liner during waking hours with initially starting 2 to 3 hours aware and then adding half an hour of wear each day until the patient gets used to wearing the liner.   Still has a difficult disposition given housing instability but CSW is involved.  Appears to have a place on 7/11.       Assessment and  Plan:  Sepsis secondary to cellulitis/gangrene of left foot status post left BKA Prior history of right BKA, wheelchair-bound. Patient initially was placed on empiric IV antibiotics of vancomycin, cefepime and Flagyl. Underwent left BKA 12/10/2022. Pt received left limb prosthesis yesterday. -PT also recommending possible prosthesis for right leg and recommended getting an x-ray to evaluate his anatomy which showed interval right lower extremity amputation at the knee joint.  Hip joint alignment maintained without dislocation.  Moderate degenerative changes of the right hip joint.  No erosion or periosteal elevation.  Soft tissue thickening at the distal stump.  No soft tissue gas. -Lovenox for dvt ppx   Housing instability  -Patient told me that he has established long-term housing at [name] and will probably be moving 7/11  Anterior maxillary abscess Seen by oral surgeon, Dr. [name]. Patient is s/p I&D of anterior maxillary abscess, surgical extraction of teeth 4,5,6 ,11,12, 13, 20, 21, 22, 27, 28, 29. Simple extraction teeth # 7, 8, 9, 10, 23, 24, 25, 26. Status post angioplasty x 4 quadrants on 03/25/2023. S/p 7 days of Augmentin. -Continue regular diet. -Continue oral hygiene and continue benzocaine mucosal gel for the mouth and throat 4 times daily as needed for mild pain.   Unilateral hearing loss in the setting of cerumen impaction Otoscopic exam revealed severely impacted ear. Prior physician discussed case with Dr. Jenne Pane ENT who recommended that patient benefit from outpatient evaluation. Recommended continued flushing of the ears and continue Debrox and adding  Ciprodex if necessary. -Continue Debrox to both ears.   Chronic combined systolic diastolic CHF Compensated. 2D echo done 08/2022 with a EF of 40% with global hypokinesis. -Continue Coreg, hydralazine, Imdur, Lasix, Norvasc.    Hypertension BP 134/92 -Continue Coreg, Imdur, hydralazine, Norvasc and Lasix.     COPD Stable. -Continue Xopenex as needed.   Anxiety Stable. -Continue Hydroxyzine as needed.   GERD -Patient refused PPI early on during the hospitalization.    Constipation -Continue bowel regimen of MiraLAX and Dulcolax as needed.     Chronic pain -Continue OxyContin 30 mg twice daily. -Continue oxycodone 20 mg every 4 hours as needed  -Continue Tramadol 50mg  Q12HPRN    Shoulder pain -Continue lidocaine patch and camphor topical.    Schizoaffective disorder -Continue Abilify, Lexapro.     Obesity BMI 29.3 kg/m. -Lifestyle modification -Outpatient follow-up.  Subjective:  Moving around the hospital in his wheelchair  Physical Exam:  Vitals:   05/31/23 0444 05/31/23 0824 06/01/23 0749 06/02/23 0718  BP: (!) 134/92 (!) 131/93 133/83 (!) 147/92  Pulse: 71 73 69 70  Resp: 18 16 18 17   Temp: 97.8 F (36.6 C) 98 F (36.7 C) 98.2 F (36.8 C) 98.4 F (36.9 C)  TempSrc: Oral Oral Oral Oral  SpO2: 100% 100% 100% 98%  Weight:      Height:        General: Appearance:     Overweight male in no acute distress     Lungs:     respirations unlabored  Heart:    Normal heart rate. Normal rhythm. No murmurs, rubs, or gallops.      Neurologic:   Awake, alert     Data Reviewed:  I have personally reviewed and interpreted labs, imaging.  CBC: No results for input(s): "WBC", "NEUTROABS", "HGB", "HCT", "MCV", "PLT" in the last 168 hours.  Basic Metabolic Panel: No results for input(s): "NA", "K", "CL", "CO2", "GLUCOSE", "BUN", "CREATININE", "CALCIUM", "MG", "PHOS" in the last 168 hours.  GFR: Estimated Creatinine Clearance: 94.4 mL/min (by C-G formula based on SCr of 0.98 mg/dL). Liver Function Tests: No results for input(s): "AST", "ALT", "ALKPHOS", "BILITOT", "PROT", "ALBUMIN" in the last 168 hours.  Coagulation Profile: No results for input(s): "INR", "PROTIME" in the last 168 hours.   Code Status:  Full code.    Author:  Joseph Art  DO  06/02/2023 9:45 AM

## 2023-06-02 NOTE — Plan of Care (Signed)
Problem: Pain Managment: Goal: General experience of comfort will improve 06/02/2023 1537 by Olga Millers, LPN Outcome: Adequate for Discharge 06/02/2023 1536 by Olga Millers, LPN Outcome: Adequate for Discharge 06/02/2023 1536 by Olga Millers, LPN Outcome: Progressing   Problem: Safety: Goal: Ability to remain free from injury will improve 06/02/2023 1537 by Olga Millers, LPN Outcome: Adequate for Discharge 06/02/2023 1536 by Olga Millers, LPN Outcome: Adequate for Discharge 06/02/2023 1536 by Olga Millers, LPN Outcome: Progressing   Problem: Skin Integrity: Goal: Risk for impaired skin integrity will decrease 06/02/2023 1537 by Olga Millers, LPN Outcome: Adequate for Discharge 06/02/2023 1536 by Olga Millers, LPN Outcome: Adequate for Discharge 06/02/2023 1536 by Olga Millers, LPN Outcome: Progressing   Problem: Education: Goal: Knowledge of the prescribed therapeutic regimen will improve 06/02/2023 1537 by Olga Millers, LPN Outcome: Adequate for Discharge 06/02/2023 1536 by Olga Millers, LPN Outcome: Adequate for Discharge 06/02/2023 1536 by Olga Millers, LPN Outcome: Progressing Goal: Ability to verbalize activity precautions or restrictions will improve 06/02/2023 1537 by Olga Millers, LPN Outcome: Adequate for Discharge 06/02/2023 1536 by Olga Millers, LPN Outcome: Adequate for Discharge 06/02/2023 1536 by Olga Millers, LPN Outcome: Progressing Goal: Understanding of discharge needs will improve 06/02/2023 1537 by Olga Millers, LPN Outcome: Adequate for Discharge 06/02/2023 1536 by Olga Millers, LPN Outcome: Adequate for Discharge 06/02/2023 1536 by Olga Millers, LPN Outcome: Progressing   Problem: Activity: Goal: Ability to perform//tolerate increased activity and mobilize with assistive devices will improve 06/02/2023 1537 by Olga Millers, LPN Outcome: Adequate for Discharge 06/02/2023 1536 by Olga Millers, LPN Outcome: Adequate for Discharge 06/02/2023 1536 by Olga Millers, LPN Outcome: Progressing   Problem: Clinical Measurements: Goal: Postoperative complications will be avoided or minimized 06/02/2023 1537 by Olga Millers, LPN Outcome: Adequate for Discharge 06/02/2023 1536 by Olga Millers, LPN Outcome: Adequate for Discharge 06/02/2023 1536 by Olga Millers, LPN Outcome: Progressing   Problem: Self-Care: Goal: Ability to meet self-care needs will improve 06/02/2023 1537 by Olga Millers, LPN Outcome: Adequate for Discharge 06/02/2023 1536 by Olga Millers, LPN Outcome: Adequate for Discharge 06/02/2023 1536 by Olga Millers, LPN Outcome: Progressing   Problem: Self-Concept: Goal: Ability to maintain and perform role responsibilities to the fullest extent possible will improve 06/02/2023 1537 by Olga Millers, LPN Outcome: Adequate for Discharge 06/02/2023 1536 by Olga Millers, LPN Outcome: Adequate for Discharge 06/02/2023 1536 by Olga Millers, LPN Outcome: Progressing   Problem: Pain Management: Goal: Pain level will decrease with appropriate interventions 06/02/2023 1537 by Olga Millers, LPN Outcome: Adequate for Discharge 06/02/2023 1536 by Olga Millers, LPN Outcome: Adequate for Discharge 06/02/2023 1536 by Olga Millers, LPN Outcome: Progressing   Problem: Skin Integrity: Goal: Demonstration of wound healing without infection will improve 06/02/2023 1537 by Olga Millers, LPN Outcome: Adequate for Discharge 06/02/2023 1536 by Olga Millers, LPN Outcome: Adequate for Discharge 06/02/2023 1536 by Olga Millers, LPN Outcome: Progressing   Problem: Education: Goal: Knowledge of General Education information will improve Description: Including pain rating scale, medication(s)/side effects and non-pharmacologic comfort measures 06/02/2023 1537 by Olga Millers, LPN Outcome: Adequate for Discharge 06/02/2023 1536 by Olga Millers, LPN Outcome: Adequate for Discharge 06/02/2023 1536 by Olga Millers, LPN Outcome: Progressing   Problem: Health  Behavior/Discharge Planning: Goal: Ability to manage health-related needs will improve 06/02/2023 1537 by Tarri Glenn,  [place], LPN Outcome: Adequate for Discharge 06/02/2023 1536 by Olga Millers, LPN Outcome: Adequate for Discharge 06/02/2023 1536 by Olga Millers, LPN Outcome: Progressing   Problem: Clinical Measurements: Goal: Ability to maintain clinical measurements within normal limits will improve 06/02/2023 1537 by Olga Millers, LPN Outcome: Adequate for Discharge 06/02/2023 1536 by Olga Millers, LPN Outcome: Adequate for Discharge 06/02/2023 1536 by Olga Millers, LPN Outcome: Progressing Goal: Will remain free from infection 06/02/2023 1537 by Olga Millers, LPN Outcome: Adequate for Discharge 06/02/2023 1536 by Olga Millers, LPN Outcome: Adequate for Discharge 06/02/2023 1536 by Olga Millers, LPN Outcome: Progressing Goal: Diagnostic test results will improve 06/02/2023 1537 by Olga Millers, LPN Outcome: Adequate for Discharge 06/02/2023 1536 by Olga Millers, LPN Outcome: Adequate for Discharge 06/02/2023 1536 by Olga Millers, LPN Outcome: Progressing Goal: Respiratory complications will improve 06/02/2023 1537 by Olga Millers, LPN Outcome: Adequate for Discharge 06/02/2023 1536 by Olga Millers, LPN Outcome: Adequate for Discharge 06/02/2023 1536 by Olga Millers, LPN Outcome: Progressing Goal: Cardiovascular complication will be avoided 06/02/2023 1537 by Olga Millers, LPN Outcome: Adequate for Discharge 06/02/2023 1536 by Olga Millers, LPN Outcome: Adequate for Discharge 06/02/2023 1536 by Olga Millers, LPN Outcome: Progressing   Problem: Activity: Goal: Risk for activity intolerance will decrease 06/02/2023 1537 by Olga Millers, LPN Outcome: Adequate for Discharge 06/02/2023 1536 by Olga Millers, LPN Outcome: Adequate for Discharge 06/02/2023 1536 by Olga Millers, LPN Outcome: Progressing   Problem: Nutrition: Goal: Adequate nutrition will be maintained 06/02/2023 1537 by Olga Millers, LPN Outcome: Adequate for Discharge 06/02/2023 1536 by Olga Millers, LPN Outcome: Adequate for Discharge 06/02/2023 1536 by Olga Millers, LPN Outcome: Progressing   Problem: Coping: Goal: Level of anxiety will decrease 06/02/2023 1537 by Olga Millers, LPN Outcome: Adequate for Discharge 06/02/2023 1536 by Olga Millers, LPN Outcome: Adequate for Discharge 06/02/2023 1536 by Olga Millers, LPN Outcome: Progressing   Problem: Elimination: Goal: Will not experience complications related to bowel motility 06/02/2023 1537 by Olga Millers, LPN Outcome: Adequate for Discharge 06/02/2023 1536 by Olga Millers, LPN Outcome: Adequate for Discharge 06/02/2023 1536 by Olga Millers, LPN Outcome: Progressing Goal: Will not experience complications related to urinary retention 06/02/2023 1537 by Olga Millers, LPN Outcome: Adequate for Discharge 06/02/2023 1536 by Olga Millers, LPN Outcome: Adequate for Discharge 06/02/2023 1536 by Olga Millers, LPN Outcome: Progressing   Problem: Pain Managment: Goal: General experience of comfort will improve 06/02/2023 1537 by Olga Millers, LPN Outcome: Adequate for Discharge 06/02/2023 1536 by Olga Millers, LPN Outcome: Adequate for Discharge 06/02/2023 1536 by Olga Millers, LPN Outcome: Progressing   Problem: Safety: Goal: Ability to remain free from injury will improve 06/02/2023 1537 by Olga Millers, LPN Outcome: Adequate for Discharge 06/02/2023 1536 by Olga Millers, LPN Outcome: Adequate for Discharge 06/02/2023 1536 by Olga Millers, LPN Outcome: Progressing   Problem: Skin Integrity: Goal: Risk for impaired skin integrity will decrease 06/02/2023 1537 by Olga Millers, LPN Outcome: Adequate for Discharge 06/02/2023 1536 by Olga Millers, LPN Outcome: Adequate for Discharge 06/02/2023 1536 by Olga Millers, LPN Outcome: Progressing

## 2023-06-02 NOTE — Plan of Care (Signed)
  Problem: Pain Managment: Goal: General experience of comfort will improve Outcome: Progressing   Problem: Safety: Goal: Ability to remain free from injury will improve Outcome: Progressing   Problem: Skin Integrity: Goal: Risk for impaired skin integrity will decrease Outcome: Progressing   Problem: Education: Goal: Knowledge of General Education information will improve Description: Including pain rating scale, medication(s)/side effects and non-pharmacologic comfort measures Outcome: Progressing   

## 2023-06-02 NOTE — Progress Notes (Signed)
Occupational Therapy Treatment Patient Details Name: [name] MRN: [id number] DOB: December 26, 1965 Today's Date: 06/02/2023   History of present illness Pt is a 57 y/o M who presented 12/17/22 with L ankle cellulitis and underwent L BKA on 1/10. Oral abscesses noted, underwent oral surgery Wed, 4/24; Initiated fabrication of prosthesis for LLE on 6/3; PMH includes asthma, bioplar 1, depression, schizoaffective disorder, CVA, chronic CHF, homelessness, HTN, DM2, cocaine and cannabis use, and R knee disarticulation   OT comments  OT continuing to problem solve patient use of prosthetic to allow for easier independent donning. Pt seems to have less involvement of tremors with RUE stabilized against a surface so discussed having some form of table that will allow him to slide his RUE against it to put on his prosthetic sleeve. Also engineering ideas to allow for more gross motor control of task. OT to continue to optimize patient functioning in anticipation for DC.   Recommendations for follow up therapy are one component of a multi-disciplinary discharge planning process, led by the attending physician.  Recommendations may be updated based on patient status, additional functional criteria and insurance authorization.    Assistance Recommended at Discharge Intermittent Supervision/Assistance  Patient can return home with the following  A little help with walking and/or transfers   Equipment Recommendations   (pending further assessment)        ADL   General ADL Comments: Observed pt completing normal routine of donning prostethesis. Pt noted to have challenge with donning prostehtic sleeve due to tremors in RUE, discussed with patient using a stable surface to prop his RUE and slide his arm down in a more controlled manner as patient has less tremor involvment when his RUE is supported. Attempted to don prostetic using a modified hinged device, but pt report he has no troubles with putting on the  device itself just the sleeve. Tring to problem solve the use of a long sockaid with strings or wire frames that will allow patient to don/doff sleeve in a more coordinated manner      Frequency  Min 3X/week        Progress Toward Goals  OT Goals(current goals can now be found in the care plan section)  Progress towards OT goals: Progressing toward goals  Acute Rehab OT Goals Patient Stated Goal: To get RUE under control OT Goal Formulation: With patient Time For Goal Achievement: 06/15/23 Potential to Achieve Goals: Good  Plan Discharge plan remains appropriate;Frequency remains appropriate       AM-PAC OT "6 Clicks" Daily Activity     Outcome Measure   Help from another person eating meals?: None Help from another person taking care of personal grooming?: None Help from another person toileting, which includes using toliet, bedpan, or urinal?: None Help from another person bathing (including washing, rinsing, drying)?: None Help from another person to put on and taking off regular upper body clothing?: None Help from another person to put on and taking off regular lower body clothing?: None 6 Click Score: 24    End of Session Equipment Utilized During Treatment: Other (comment) (w/c)  OT Visit Diagnosis: Other symptoms and signs involving the nervous system (R29.898)   Activity Tolerance Patient tolerated treatment well   Patient Left Other (comment) (in w/c in room)   Nurse Communication Mobility status        Time: 7829-5621 OT Time Calculation (min): 21 min  Charges: OT General Charges $OT Visit: 1 Visit OT Treatments $Therapeutic Activity: 8-22 mins  06/02/2023  AB, OTR/L  Acute Rehabilitation Services  Office: [phone]   Tristan Schroeder 06/02/2023, 6:02 PM

## 2023-06-03 DIAGNOSIS — I96 Gangrene, not elsewhere classified: Secondary | ICD-10-CM | POA: Diagnosis not present

## 2023-06-03 NOTE — Progress Notes (Signed)
[name]  ZOX:[id number] DOB: 10-02-1966 DOA: 12/07/2022 PCP: [name], NP    Brief Narrative:  56 year old with a history of chronic combined systolic and diastolic CHF, CAD, right BKA, COPD, HLD, polyneuropathy, and schizoaffective/bipolar disorder who was admitted to the hospital 12/07/2022 with left lower extremity cellulitis and gangrene and ultimately required a left BKA 12/10/2022.  During his hospitalization he has also been found to have maxillary abscesses and multiple periapical abscesses and required oral surgery with I&D of an anterior maxillary abscess and surgical extraction of multiple teeth with alveoloplasty.  The patient's hospital stay has been prolonged due to difficulty in safe placement related to his prior homelessness complicated by his new left BKA and prior right AKA.   Goals of Care:  Code Status: Full Code   DVT prophylaxis: Lovenox  Interim Hx: Afebrile.  Vital signs stable.  No new labs ordered for today.  No complaints today at time of exam.  Assessment & Plan:  Sepsis due to cellulitis and gangrene of the left foot -now status post left BKA Initially treated with IV vancomycin cefepime and Flagyl -ultimately underwent left BKA 12/10/2022 -has received a left limb prosthesis -physical therapy continues to work with the patient on use of prosthesis and ambulation  Housing instability Patient informed Dr. Zenaida Niece that he has established long-term housing at [name] and should be able to move in 06/11/2023  Anterior maxillary abscess -multiple periapical abscesses of teeth Status post I&D of anterior maxillary abscess and surgical extraction of numerous teeth as well as simple extraction of many other teeth and alveoloplasty x 4 quadrants 03/25/2023 -continue oral hygiene and benzocaine mucosal gel as needed  Unilateral hearing loss in setting of cerumen impaction Otoscope exam reveals severely impacted ear - case was discussed with ENT who  suggested an outpatient evaluation with continued flushing of the ears and use of Debrox  Chronic combined systolic and diastolic CHF Well compensated presently -TTE September 2023 noted EF 40% with global hypokinesis -continue usual Imdur Lasix and Coreg  HTN Blood pressure presently well-controlled  COPD Stable  Constipation Continue bowel regimen  Chronic pain Continue current pain medication regimen without change  Schizoaffective disorder/bipolar disorder Continue Lexapro and Abilify  Obesity - Body mass index is 29.3 kg/m.   Family Communication: No family present at time of exam Disposition: Anticipate discharge to home 06/11/2023   Objective: Blood pressure (!) 146/92, pulse (!) 59, temperature 98.2 F (36.8 C), temperature source Oral, resp. rate 18, height 5\' 9"  (1.753 m), weight 90 kg, SpO2 98 %.  Intake/Output Summary (Last 24 hours) at 06/03/2023 0839 Last data filed at 06/02/2023 2240 Gross per 24 hour  Intake --  Output 300 ml  Net -300 ml   Filed Weights   05/09/23 0500 05/11/23 0428 05/12/23 0633  Weight: 90.7 kg 90 kg 90 kg    Examination: General: No acute respiratory distress Lungs: Clear to auscultation bilaterally without wheezes or crackles Cardiovascular: Regular rate and rhythm without murmur gallop or rub normal S1 and S2 Abdomen: Nontender, nondistended, soft, bowel sounds positive, no rebound, no ascites, no appreciable mass Extremities: No significant cyanosis, clubbing, or edema bilateral lower extremities  CBC: No results for input(s): "WBC", "NEUTROABS", "HGB", "HCT", "MCV", "PLT" in the last 168 hours. Basic Metabolic Panel: No results for input(s): "NA", "K", "CL", "CO2", "GLUCOSE", "BUN", "CREATININE", "CALCIUM", "MG", "PHOS" in the last 168 hours. GFR: Estimated Creatinine Clearance: 94.4 mL/min (by C-G formula based on SCr of 0.98 mg/dL).  Scheduled Meds:  amLODipine  10 mg Oral Daily   ARIPiprazole  5 mg Oral Daily    vitamin C  1,000 mg Oral Daily   carbamide peroxide  5 drop Both EARS BID   carvedilol  6.25 mg Oral BID WC   chlorhexidine  15 mL Mouth/Throat TID   enoxaparin (LOVENOX) injection  40 mg Subcutaneous Q24H   escitalopram  5 mg Oral Daily   feeding supplement  237 mL Oral BID BM   furosemide  20 mg Oral Daily   gabapentin  900 mg Oral TID   hydrALAZINE  25 mg Oral Q8H   isosorbide mononitrate  15 mg Oral Daily   lidocaine  1 patch Transdermal Q24H   melatonin  3 mg Oral QHS   multivitamin with minerals  1 tablet Oral Daily   nystatin  5 mL Oral QID   oxyCODONE  30 mg Oral Q12H      LOS: 178 days   [name], MD [facility] Office  [phone] Pager - Text Page per Loretha Stapler  If 7PM-7AM, please contact night-coverage per Amion 06/03/2023, 8:39 AM

## 2023-06-03 NOTE — Progress Notes (Signed)
Physical Therapy Treatment Patient Details Name: [name] MRN: [id number] DOB: 09-16-1966 Today's Date: 06/03/2023   History of Present Illness Pt is a 57 y/o M who presented 12/17/22 with L ankle cellulitis and underwent L BKA on 1/10. Oral abscesses noted, underwent oral surgery Wed, 4/24; Initiated fabrication of prosthesis for LLE on 6/3; PMH includes asthma, bioplar 1, depression, schizoaffective disorder, CVA, chronic CHF, homelessness, HTN, DM2, cocaine and cannabis use, and R knee disarticulation    PT Comments  Continuing work on functional mobility and activity tolerance, including work on independence with donning prosthesis; Offered [name] a bolster to prop his elbow and stabilize while donning liner -- [name] opted not to use it this session; Notable that [name] stood to a RW with mod assist of 2 while trying to get the prosthesis to click in; [name] tried pushing the foot of the prosthesis against the wall to push the pin in to click, without success today; after that, assisted [name] in donning prosthesis;   Then focused on functional transfers with prosthesis, and he was able to transfer WC<> mat table, and WC <> toilet with intermittent min assist to steady WC;   Discussed functional status and tasks that challenge [name] with OT    Assistance Recommended at Discharge PRN  If plan is discharge home, recommend the following:  Can travel by private vehicle    Assist for transportation   Yes (Currently recommend a sliding board for car transfers)  Equipment Recommendations  Other (comment);Rolling walker (2 wheels) (tub bench, sliding board)    Recommendations for Other Services  (Recommend starting the Mental/Behavioral Health support through [name] to start while [name] is here in the hospital; [name] consult; OT consult)     Precautions / Restrictions Precautions Precautions: None Precaution Comments: R knee disrticulation, L BKA; Fall risk is present, but  minimal Restrictions RLE Weight Bearing: Weight bearing as tolerated LLE Weight Bearing: Weight bearing as tolerated     Mobility  Bed Mobility                    Transfers Overall transfer level: Needs assistance (Modified independent with wc transfers) Equipment used: Rolling walker (2 wheels) Transfers: Sit to/from Stand, Bed to chair/wheelchair/BSC Sit to Stand: +2 physical assistance, +2 safety/equipment, Mod assist     Squat pivot transfers: Min guard (with and without physical contact)     General transfer comment: Stood from wheelchair to RW; RW pushed flush to wall for stability; Decided to try standing to the RW to help get prosthesis locked on (did not acheive "click in"); Once prosthesis was locked in, practiced wheelchair transfers to/from mat table, and then to/from  toilet; Minguard assist mostly for wheelchair stabilizing    Ambulation/Gait               General Gait Details: Unable         Balance     Sitting balance-[name] Scale: Normal       Standing balance-[name] Scale: Poor                              Cognition Arousal/Alertness: Awake/alert Behavior During Therapy: WFL for tasks assessed/performed Overall Cognitive Status: Within Functional Limits for tasks assessed (for simple mobility tasks)                                 General  Comments: When [name] understands rationale for an exercise or activity, and that rationale is in line with his goals, he participates much better        Exercises      General Comments General comments (skin integrity, edema, etc.): smoother ability to don liner, R UE tremor present, but less amplitude today; Lots of time spent problem-solving and trying different methods for [name] to get prosthesis locked in without physical assist from another person; will continue efforts      Pertinent Vitals/Pain Pain Assessment Faces Pain Scale: Hurts even more Pain Location: L  distal residual limb with weight bearing in prosthesis Pain Descriptors / Indicators: Grimacing    Home Living                          Prior Function            PT Goals (current goals can now be found in the care plan section) Acute Rehab PT Goals Patient Stated Goal: Prepare for prosthetics fitting; Would like to get housing in [facility] through the Transition to [employer] PT Goal Formulation: With patient Time For Goal Achievement: 06/09/23 Potential to Achieve Goals: Good Progress towards PT goals: Progressing toward goals    Frequency    Min 2X/week      PT Plan Current plan remains appropriate;Discharge plan needs to be updated    Co-evaluation              AM-PAC PT "6 Clicks" Mobility   Outcome Measure  Help needed turning from your back to your side while in a flat bed without using bedrails?: None Help needed moving from lying on your back to sitting on the side of a flat bed without using bedrails?: None Help needed moving to and from a bed to a chair (including a wheelchair)?: None Help needed standing up from a chair using your arms (e.g., wheelchair or bedside chair)?: Total Help needed to walk in hospital room?: Total Help needed climbing 3-5 steps with a railing? : Total 6 Click Score: 15    End of Session Equipment Utilized During Treatment: Other (comment);Gait belt (prosthesis) Activity Tolerance: Patient tolerated treatment well Patient left:  (managing independently) Nurse Communication: Mobility status PT Visit Diagnosis: Muscle weakness (generalized) (M62.81);Other abnormalities of gait and mobility (R26.89)     Time: 4098-1191 PT Time Calculation (min) (ACUTE ONLY): 38 min  Charges:    $Therapeutic Activity: 23-37 mins $Self Care/Home Management: 8-22 PT General Charges $$ ACUTE PT VISIT: 1 Visit                     Van Clines, PT  Acute Rehabilitation Services Office [phone] Secure Chat  welcomed    [name] 06/03/2023, 2:05 PM

## 2023-06-03 NOTE — Plan of Care (Signed)
  Problem: Pain Managment: Goal: General experience of comfort will improve Outcome: Progressing   Problem: Safety: Goal: Ability to remain free from injury will improve Outcome: Progressing   Problem: Skin Integrity: Goal: Risk for impaired skin integrity will decrease Outcome: Progressing   Problem: Education: Goal: Knowledge of General Education information will improve Description: Including pain rating scale, medication(s)/side effects and non-pharmacologic comfort measures Outcome: Progressing   

## 2023-06-03 NOTE — TOC Progression Note (Signed)
Transition of Care [facility]) - Progression Note    Patient Details  Name: [name] MRN: [id number] Date of Birth: [birth year]  Transition of Care [facility]) CM/SW Contact  [name], [name] Phone Number: 06/03/2023, 9:03 AM  Clinical Narrative:    CSW spoke with [facility], PT who agrees patient should receive a rolling walker to further assist with therapies after discharged.  CSW sent secure chat to MD to have DME order placed.  CSW spoke with Jermaine at [place] to have walker ordered. [employer] will deliver walker directly to patient's bedside.   Expected Discharge Plan: Home/Self Care Barriers to Discharge: Homeless with medical needs  Expected Discharge Plan and Services   Discharge Planning Services: CM Consult Post Acute Care Choice: Home Health Living arrangements for the past 2 months: Homeless                                       Social Determinants of Health (SDOH) Interventions SDOH Screenings   Food Insecurity: Food Insecurity Present (11/24/2022)  Housing: Medium Risk (12/07/2022)  Transportation Needs: Unmet Transportation Needs (12/07/2022)  Utilities: Not At Risk (12/07/2022)  Alcohol Screen: Medium Risk (06/24/2019)  Depression (PHQ2-9): Medium Risk (02/10/2021)  Tobacco Use: High Risk (03/26/2023)    Readmission Risk Interventions    12/09/2022    9:02 AM 12/01/2022    3:56 PM 08/18/2022    9:00 AM  Readmission Risk Prevention Plan  Transportation Screening Complete Complete Complete  Medication Review [employer]) Complete Complete Complete  PCP or Specialist appointment within 3-5 days of discharge Complete Complete Complete  HRI or Home Care Consult Complete Complete Complete  SW Recovery Care/Counseling Consult Complete Complete Complete  Palliative Care Screening Not Applicable Not Applicable Not Applicable  Skilled Nursing Facility Not Applicable Not Applicable Not Applicable  SNF Comments  n/a

## 2023-06-03 NOTE — Progress Notes (Addendum)
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 Oral abscesses noted, underwent oral surgery Wed, 4/24; Initiated fabrication of prosthesis for LLE on 6/3; PMH includes asthma, bioplar 1, depression, schizoaffective disorder, CVA, chronic CHF, homelessness, HTN, DM2, cocaine and cannabis use, and R knee disarticulation   OT comments  Attempted NMES today (settings below) to fatigue extensor muscle mass and reduce spasms while isolating wrist ext/flex movement. Pt tolerating NMES well and noted to have more gross control of RUE directly following use, pt even donned R glove more fluidly. Directly following NMES use no spasms were noted, will follow-up with patient later to reassess the true benefits of NMES on RUE coordination. OT to continue progressing pt as able, reached out to [facility] to find any AE that will help pt don prosthetic sleeve in a more coordinated manner.    Recommendations for follow up therapy are one component of a multi-disciplinary discharge planning process, led by the attending physician.  Recommendations may be updated based on patient status, additional functional criteria and insurance authorization.    Assistance Recommended at Discharge Intermittent Supervision/Assistance  Patient can return home with the following  A little help with walking and/or transfers   Equipment Recommendations  Other (comment) (pending further assessment)    Recommendations for Other Services      Precautions / Restrictions Precautions Precautions: None Precaution Comments: R knee disrticulation, L BKA; Fall risk is present, but minimal Restrictions Weight Bearing Restrictions: No RLE Weight Bearing: Weight bearing as tolerated LLE Weight Bearing: Weight bearing as tolerated        Mobility Bed Mobility                    Transfers                         Balance                                           ADL either performed or assessed with clinical judgement   ADL                                         General ADL Comments: Focused session on using modalities to control RUE spasms in extensor muscle mass. Utilized EMPI NMES unit to isolate muscle contractions of wrist flexors/extensors and promote neuroplasticity of separate muscle contractions to improve GMC in RUE. Performed a continious cycles for 12 mins (6 wrist flexors) (6 wrist extensors) at a rate of 145pps with leads attached to distal wrists then extensor and flexor muscle masses while positioned with RUE supported on table in seated position. Pt tolerating treatment well with stimulus as high as 60hz  for wrist extensors and 52.5 for wrist flexors, pt denies pain and no physical signs of intolerance noted.    Extremity/Trunk Assessment              Vision       Perception     Praxis      Cognition Arousal/Alertness: Awake/alert Behavior During Therapy: WFL for tasks assessed/performed Overall Cognitive  Status: Within Functional Limits for tasks assessed                                          Exercises      Shoulder Instructions       General Comments smoother ability to don liner, R UE tremor present, but less amplitude today; Lots of time spent problem-solving and trying different methods for Jascha to get prosthesis locked in without physical assist from another person; will continue efforts    Pertinent Vitals/ Pain       Pain Assessment Pain Assessment: No/denies pain  Home Living                                          Prior Functioning/Environment              Frequency  Min 2X/week        Progress Toward Goals  OT Goals(current goals can now be found in the care  plan section)  Progress towards OT goals: Progressing toward goals  Acute Rehab OT Goals Patient Stated Goal: To get RUE under control OT Goal Formulation: With patient Time For Goal Achievement: 06/15/23 Potential to Achieve Goals: Good  Plan Discharge plan remains appropriate;Frequency needs to be updated    Co-evaluation                 AM-PAC OT "6 Clicks" Daily Activity     Outcome Measure   Help from another person eating meals?: None Help from another person taking care of personal grooming?: None Help from another person toileting, which includes using toliet, bedpan, or urinal?: None Help from another person bathing (including washing, rinsing, drying)?: None Help from another person to put on and taking off regular upper body clothing?: None Help from another person to put on and taking off regular lower body clothing?: None 6 Click Score: 24    End of Session Equipment Utilized During Treatment: Other (comment) (w/c and EMPI NMES)  OT Visit Diagnosis: Other symptoms and signs involving the nervous system (R29.898)   Activity Tolerance Patient tolerated treatment well   Patient Left Other (comment) (In w/c)   Nurse Communication Mobility status        Time: 4098-1191 OT Time Calculation (min): 28 min  Charges: OT General Charges $OT Visit: 1 Visit OT Treatments $Therapeutic Activity: 8-22 mins $Neuromuscular Re-education: 8-22 mins  06/03/2023  AB, OTR/L  Acute Rehabilitation Services  Office: [phone]   Tristan Schroeder 06/03/2023, 4:10 PM

## 2023-06-04 DIAGNOSIS — I96 Gangrene, not elsewhere classified: Secondary | ICD-10-CM | POA: Diagnosis not present

## 2023-06-04 MED ORDER — ISOSORB DINITRATE-HYDRALAZINE 20-37.5 MG PO TABS
1.0000 | ORAL_TABLET | Freq: Three times a day (TID) | ORAL | Status: DC
  Administered 2023-06-05 – 2023-06-15 (×28): 1 via ORAL
  Filled 2023-06-04 (×36): qty 1

## 2023-06-04 NOTE — Progress Notes (Signed)
[name]  ZOX:[id number] DOB: 1966/09/10 DOA: 12/07/2022 PCP: [name], NP    Brief Narrative:  57 year old with a history of chronic combined systolic and diastolic CHF, CAD, right BKA, COPD, HLD, polyneuropathy, and schizoaffective/bipolar disorder who was admitted to the hospital 12/07/2022 with left lower extremity cellulitis and gangrene and ultimately required a left BKA 12/10/2022.  During his hospitalization he has also been found to have maxillary abscesses and multiple periapical abscesses and required oral surgery with I&D of an anterior maxillary abscess and surgical extraction of multiple teeth with alveoloplasty.  The patient's hospital stay has been prolonged due to difficulty in safe placement related to his prior homelessness complicated by his new left BKA and prior right AKA.   Goals of Care:  Code Status: Full Code   DVT prophylaxis: Lovenox  Interim Hx: No acute issues recorded overnight.  Afebrile.  Vital signs stable.  Resting comfortably in bedside chair.  Assessment & Plan:  Sepsis due to cellulitis and gangrene of the left foot -now status post left BKA Initially treated with IV vancomycin cefepime and Flagyl -ultimately underwent left BKA 12/10/2022 -has received a left limb prosthesis -physical therapy continues to work with the patient on use of prosthesis and ambulation  Housing instability Patient informed Dr. Zenaida Niece that he has established long-term housing at [name] and should be able to move in 06/11/2023  Anterior maxillary abscess -multiple periapical abscesses of teeth Status post I&D of anterior maxillary abscess and surgical extraction of numerous teeth as well as simple extraction of many other teeth and alveoloplasty x 4 quadrants 03/25/2023 -continue oral hygiene and benzocaine mucosal gel as needed  Unilateral hearing loss in setting of cerumen impaction Otoscope exam reveals severely impacted ear - case was discussed with ENT  who suggested an outpatient evaluation with continued flushing of the ears and use of Debrox  Chronic combined systolic and diastolic CHF Well compensated presently -TTE September 2023 noted EF 40% with global hypokinesis -continue usual Imdur Lasix and Coreg  HTN Blood pressure presently well-controlled  COPD Stable  Constipation Continue bowel regimen  Chronic pain Continue current pain medication regimen without change  Schizoaffective disorder/bipolar disorder Continue Lexapro and Abilify  Obesity - Body mass index is 29.3 kg/m.   Family Communication: No family present at time of exam Disposition: Anticipate discharge to home 06/11/2023   Objective: Blood pressure (!) 158/109, pulse 80, temperature 98.3 F (36.8 C), temperature source Oral, resp. rate 18, height 5\' 9"  (1.753 m), weight 90 kg, SpO2 100 %.  Intake/Output Summary (Last 24 hours) at 06/04/2023 0926 Last data filed at 06/03/2023 2100 Gross per 24 hour  Intake 237 ml  Output --  Net 237 ml    Filed Weights   05/09/23 0500 05/11/23 0428 05/12/23 0633  Weight: 90.7 kg 90 kg 90 kg    Examination: General: No acute respiratory distress Lungs: Clear to auscultation bilaterally  Cardiovascular: Regular rate and rhythm  Abdomen: Nontender, nondistended, soft, bowel sounds positive Extremities: No significant edema  CBC: No results for input(s): "WBC", "NEUTROABS", "HGB", "HCT", "MCV", "PLT" in the last 168 hours. Basic Metabolic Panel: No results for input(s): "NA", "K", "CL", "CO2", "GLUCOSE", "BUN", "CREATININE", "CALCIUM", "MG", "PHOS" in the last 168 hours. GFR: Estimated Creatinine Clearance: 94.4 mL/min (by C-G formula based on SCr of 0.98 mg/dL).   Scheduled Meds:  amLODipine  10 mg Oral Daily   ARIPiprazole  5 mg Oral Daily   vitamin C  1,000 mg Oral  Daily   carbamide peroxide  5 drop Both EARS BID   carvedilol  6.25 mg Oral BID WC   chlorhexidine  15 mL Mouth/Throat TID   enoxaparin  (LOVENOX) injection  40 mg Subcutaneous Q24H   escitalopram  5 mg Oral Daily   feeding supplement  237 mL Oral BID BM   furosemide  20 mg Oral Daily   gabapentin  900 mg Oral TID   hydrALAZINE  25 mg Oral Q8H   isosorbide mononitrate  15 mg Oral Daily   lidocaine  1 patch Transdermal Q24H   melatonin  3 mg Oral QHS   multivitamin with minerals  1 tablet Oral Daily   nystatin  5 mL Oral QID   oxyCODONE  30 mg Oral Q12H      LOS: 179 days   [name], MD [facility] Office  [phone] Pager - Text Page per Loretha Stapler  If 7PM-7AM, please contact night-coverage per Amion 06/04/2023, 9:26 AM

## 2023-06-04 NOTE — Progress Notes (Signed)
Mobility Specialist Progress Note   06/04/23 1350  Mobility  Activity Repositioned in chair;Stood at bedside  Level of Assistance Standby assist, set-up cues, supervision of patient - no hands on  Assistive Device Wheelchair  Range of Motion/Exercises Active;All extremities  RLE Weight Bearing NWB  LLE Weight Bearing WBAT  Activity Response Tolerated well   Patient received in [facility] and agreeable to participate. Session focused on donn and doffing liner and methods to donn prosthesis. Was unable to donn prosthesis independently but still working hard towards completing with independence. Also performed x2 partial stand without assistance. Tolerated without complaint or incident. Was left in [facility] with all needs met, call bell in reach.   [place] Adriano Bischof, BS EXP Mobility Specialist Please contact via SecureChat or Rehab office at [phone] t

## 2023-06-05 DIAGNOSIS — I96 Gangrene, not elsewhere classified: Secondary | ICD-10-CM | POA: Diagnosis not present

## 2023-06-05 LAB — BASIC METABOLIC PANEL WITH GFR
Anion gap: 8 (ref 5–15)
BUN: 17 mg/dL (ref 6–20)
CO2: 23 mmol/L (ref 22–32)
Calcium: 8.9 mg/dL (ref 8.9–10.3)
Chloride: 107 mmol/L (ref 98–111)
Creatinine, Ser: 1.01 mg/dL (ref 0.61–1.24)
GFR, Estimated: >60 mL/min
Glucose, Bld: 92 mg/dL (ref 70–99)
Potassium: 3.8 mmol/L (ref 3.5–5.1)
Sodium: 138 mmol/L (ref 135–145)

## 2023-06-05 LAB — CBC
HCT: 42 % (ref 39.0–52.0)
Hemoglobin: 14.1 g/dL (ref 13.0–17.0)
MCH: 31.1 pg (ref 26.0–34.0)
MCHC: 33.6 g/dL (ref 30.0–36.0)
MCV: 92.5 fL (ref 80.0–100.0)
Platelets: 253 K/uL (ref 150–400)
RBC: 4.54 MIL/uL (ref 4.22–5.81)
RDW: 13.9 % (ref 11.5–15.5)
WBC: 5.8 K/uL (ref 4.0–10.5)
nRBC: 0 % (ref 0.0–0.2)

## 2023-06-05 LAB — MAGNESIUM: Magnesium: 1.9 mg/dL (ref 1.7–2.4)

## 2023-06-05 NOTE — Progress Notes (Signed)
[name]  ZOX:[id number] DOB: [birth year] DOA: 12/07/2022 PCP: [name], NP    Brief Narrative:  57 year old with a history of chronic combined systolic and diastolic CHF, CAD, right BKA, COPD, HLD, polyneuropathy, and schizoaffective/bipolar disorder who was admitted to the hospital 12/07/2022 with left lower extremity cellulitis and gangrene and ultimately required a left BKA 12/10/2022.  During his hospitalization he has also been found to have maxillary abscesses and multiple periapical abscesses and required oral surgery with I&D of an anterior maxillary abscess and surgical extraction of multiple teeth with alveoloplasty.  The patient's hospital stay has been prolonged due to difficulty in safe placement related to his prior homelessness complicated by his new left BKA and prior right AKA.  Goals of Care:  Code Status: Full Code   DVT prophylaxis: Lovenox  Interim Hx: No acute issues recorded overnight.  Afebrile.  Vital signs stable.    Assessment & Plan:  Sepsis due to cellulitis and gangrene of the left foot > status post left BKA Initially treated with IV vancomycin cefepime and Flagyl - ultimately underwent left BKA 12/10/2022 - has received a left limb prosthesis - physical therapy continues to work with the patient on use of prosthesis and ambulation  Housing instability Patient informed Dr. Benjamine Mola that he has established long-term housing at [name] and should be able to move in 06/11/2023  Anterior maxillary abscess -multiple periapical abscesses of teeth Status post I&D of anterior maxillary abscess and surgical extraction of numerous teeth as well as simple extraction of many other teeth and alveoloplasty x 4 quadrants 03/25/2023 - continue oral hygiene and benzocaine mucosal gel as needed  Unilateral hearing loss in setting of cerumen impaction Otoscope exam reveals severely cerumen impacted ear - case was discussed with ENT who suggested an outpatient  evaluation with continued flushing of the ears and use of Debrox  Chronic combined systolic and diastolic CHF Well compensated presently - TTE September 2023 noted EF <20% with global hypokinesis - continue usual Imdur Lasix and Coreg -patient has been offered outpatient cardiology evaluation on numerous occasions but has not 100% no-show rate for office visits -he was evaluated to some extent at [facility] in 2020 -noncompliance has made him ineligible for advanced heart failure therapies  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   Family Communication: No family present at time of exam Disposition: Anticipate discharge to home 06/11/2023   Objective: Blood pressure (!) 144/96, pulse 75, temperature 97.8 F (36.6 C), temperature source Oral, resp. rate 20, height 5\' 9"  (1.753 m), weight 90 kg, SpO2 100 %.  Intake/Output Summary (Last 24 hours) at 06/05/2023 0837 Last data filed at 06/04/2023 1940 Gross per 24 hour  Intake 237 ml  Output --  Net 237 ml    Filed Weights   05/09/23 0500 05/11/23 0428 05/12/23 0633  Weight: 90.7 kg 90 kg 90 kg    Examination: General: No acute respiratory distress  CBC: Recent Labs  Lab 06/05/23 0748  WBC 5.8  HGB 14.1  HCT 42.0  MCV  92.5  PLT 253   Basic Metabolic Panel: Recent Labs  Lab 06/05/23 0748  NA 138  K 3.8  CL 107  CO2 23  GLUCOSE 92  BUN 17  CREATININE 1.01  CALCIUM 8.9  MG 1.9   GFR: Estimated Creatinine  Clearance: 91.6 mL/min (by C-G formula based on SCr of 1.01 mg/dL).   Scheduled Meds:  amLODipine  10 mg Oral Daily   ARIPiprazole  5 mg Oral Daily   vitamin C  1,000 mg Oral Daily   carvedilol  6.25 mg Oral BID WC   chlorhexidine  15 mL Mouth/Throat TID   enoxaparin (LOVENOX) injection  40 mg Subcutaneous Q24H   escitalopram  5 mg Oral Daily   furosemide  20 mg Oral Daily   gabapentin  900 mg Oral TID   isosorbide-hydrALAZINE  1 tablet Oral TID   melatonin  3 mg Oral QHS   multivitamin with minerals  1 tablet Oral Daily   oxyCODONE  30 mg Oral Q12H      LOS: 180 days   [name], MD [facility] Office  [phone] Pager - Text Page per Loretha Stapler  If 7PM-7AM, please contact night-coverage per Amion 06/05/2023, 8:37 AM

## 2023-06-05 NOTE — Progress Notes (Signed)
Mobility Specialist Progress Note   06/05/23 1430  Mobility  Activity Transferred from bed to chair;Transferred from chair to bed;Stood at bedside  Level of Assistance Minimal assist, patient does 75% or more  Assistive Device Wheelchair;Front wheel walker  Range of Motion/Exercises Active;All extremities  RLE Weight Bearing NWB  LLE Weight Bearing WBAT  Activity Response Tolerated well   Patient received in [facility] agreeable to participate in mobility. Rn, Clinical research associate and patient attempted to donn prothesis without success. Did complete STS x1 with RW and min A, but prosthesis would not set in place. Opted to LE and core exercises, ROM and stretches. Completed A&P transfer to mat table independently and performed various exercises. Returned to [facility] without complaint or incident. Was left with all needs met.  [place] Anas Reister, BS EXP Mobility Specialist Please contact via SecureChat or Rehab office at [phone]

## 2023-06-06 DIAGNOSIS — I96 Gangrene, not elsewhere classified: Secondary | ICD-10-CM | POA: Diagnosis not present

## 2023-06-06 NOTE — TOC Progression Note (Addendum)
Transition of Care [facility]) - Progression Note    Patient Details  Name: [name] MRN: [id number] Date of Birth: June 19, 1966  Transition of Care [facility]) CM/SW Contact  [name], [name] Phone Number: 06/06/2023, 9:06 AM  Clinical Narrative:    CSW sent secure email to staff at [facility] to check in to determine if the patient's discharge plan remains unchanged. Due to the holiday, CSW only received automatic replies from staff. CSW will follow up first thing Monday morning.  CSW received call from [place] at [place] [facility] stating the agency is not in network with patient's current insurance.  Patient's insurance changed on 06/01/23 to [employer] Medicaid Managed Care, managing entity is [facility]. CSW will follow up with [facility] staff to determine what companies are in network with this plan for home health services.   Expected Discharge Plan: Home/Self Care Barriers to Discharge: Homeless with medical needs  Expected Discharge Plan and Services   Discharge Planning Services: CM Consult Post Acute Care Choice: Home Health Living arrangements for the past 2 months: Homeless                                       Social Determinants of Health (SDOH) Interventions SDOH Screenings   Food Insecurity: Food Insecurity Present (11/24/2022)  Housing: Medium Risk (12/07/2022)  Transportation Needs: Unmet Transportation Needs (12/07/2022)  Utilities: Not At Risk (12/07/2022)  Alcohol Screen: Medium Risk (06/24/2019)  Depression (PHQ2-9): Medium Risk (02/10/2021)  Tobacco Use: High Risk (03/26/2023)    Readmission Risk Interventions    12/09/2022    9:02 AM 12/01/2022    3:56 PM 08/18/2022    9:00 AM  Readmission Risk Prevention Plan  Transportation Screening Complete Complete Complete  Medication Review Oceanographer) Complete Complete Complete  PCP or Specialist appointment within 3-5 days of discharge Complete Complete Complete  HRI or Home Care Consult Complete  Complete Complete  SW Recovery Care/Counseling Consult Complete Complete Complete  Palliative Care Screening Not Applicable Not Applicable Not Applicable  Skilled Nursing Facility Not Applicable Not Applicable Not Applicable  SNF Comments  n/a

## 2023-06-06 NOTE — Progress Notes (Signed)
[name]  BJY:[id number] DOB: 01-29-1966 DOA: 12/07/2022 PCP: [name] Sharps, NP    Brief Narrative:  57 year old with a history of chronic combined systolic and diastolic CHF, CAD, right BKA, COPD, HLD, polyneuropathy, and schizoaffective/bipolar disorder who was admitted to the hospital 12/07/2022 with left lower extremity cellulitis and gangrene and ultimately required a left BKA 12/10/2022.  During his hospitalization he has also been found to have maxillary abscesses and multiple periapical abscesses and required oral surgery with I&D of an anterior maxillary abscess and surgical extraction of multiple teeth with alveoloplasty.  The patient's hospital stay has been prolonged due to difficulty in safe placement related to his prior homelessness complicated by his new left BKA and prior right AKA.  Goals of Care:  Code Status: Full Code   DVT prophylaxis: Lovenox  Interim Hx: Afebrile with stable vitals.  No acute events recorded overnight.  Resting comfortably.  Assessment & Plan:  Sepsis due to cellulitis and gangrene of the left foot > status post left BKA Initially treated with IV vancomycin cefepime and Flagyl - ultimately underwent left BKA 12/10/2022 - has received a left limb prosthesis - physical therapy continues to work with the patient on use of prosthesis and ambulation  Housing instability Patient informed Dr. Benjamine [name] that he has established long-term housing at [name] and should be able to move in 06/11/2023  Anterior maxillary abscess -multiple periapical abscesses of teeth Status post I&D of anterior maxillary abscess and surgical extraction of numerous teeth as well as simple extraction of many other teeth and alveoloplasty x 4 quadrants 03/25/2023 - continue oral hygiene and benzocaine mucosal gel as needed  Unilateral hearing loss in setting of cerumen impaction Otoscope exam reveals severely cerumen impacted ear - case was discussed with ENT who suggested  an outpatient evaluation with continued flushing of the ears and use of Debrox -auditory acuity has improved significantly at this time  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 Well compensated presently - TTE September 2023 noted EF <20% with global hypokinesis - continue usual Imdur Lasix and Coreg -patient has been offered outpatient Cardiology evaluation on numerous occasions but has not 100% no-show rate for office visits -he was evaluated to some extent at [facility] in 2020 -noncompliance has made him ineligible for advanced heart failure therapies -appears euvolemic at present  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 Intention at that time was for the patient to remain on lifelong anticoagulation - Eliquis was discontinued 12/09/2022 likely in anticipation of surgery and has not been resumed since -given his long history of noncompliance and his current bilateral lower extremity amputations status I feel the risk of ongoing anticoagulation outweighs the benefit at present  HTN Blood pressure reasonably well-controlled  COPD Stable  Constipation Continue bowel regimen  Chronic pain Continue current pain medication regimen without change  Schizoaffective disorder/bipolar disorder Continue Lexapro and Abilify  Obesity - Body mass index is 29.3 kg/m.   Family Communication: No family present at time of exam Disposition: Anticipate discharge to home 06/11/2023   Objective: Blood pressure (!) 152/92, pulse 83, temperature 98.2 F (36.8 C), temperature source Oral, resp. rate 17, height 5\' 9"  (1.753 m), weight 90 kg, SpO2 98 %.  Intake/Output Summary (Last 24 hours) at 06/06/2023 0830 Last data filed at 06/05/2023 2300 Gross per 24 hour  Intake 600 ml  Output 475 ml  Net 125 ml    Filed Weights   05/09/23 0500  05/11/23 0428 05/12/23 0633  Weight: 90.7 kg 90 kg 90 kg    Examination: General: No acute distress -resting  comfortably   CBC: Recent Labs  Lab 06/05/23 0748  WBC 5.8  HGB 14.1  HCT 42.0  MCV 92.5  PLT 253    Basic Metabolic Panel: Recent Labs  Lab 06/05/23 0748  NA 138  K 3.8  CL 107  CO2 23  GLUCOSE 92  BUN 17  CREATININE 1.01  CALCIUM 8.9  MG 1.9    GFR: Estimated Creatinine Clearance: 91.6 mL/min (by C-G formula based on SCr of 1.01 mg/dL).   Scheduled Meds:  amLODipine  10 mg Oral Daily   ARIPiprazole  5 mg Oral Daily   vitamin C  1,000 mg Oral Daily   carvedilol  6.25 mg Oral BID WC   chlorhexidine  15 mL Mouth/Throat TID   enoxaparin (LOVENOX) injection  40 mg Subcutaneous Q24H   escitalopram  5 mg Oral Daily   furosemide  20 mg Oral Daily   gabapentin  900 mg Oral TID   isosorbide-hydrALAZINE  1 tablet Oral TID   melatonin  3 mg Oral QHS   multivitamin with minerals  1 tablet Oral Daily   oxyCODONE  30 mg Oral Q12H      LOS: 181 days   [name], MD [facility] Office  [phone] Pager - Text Page per Loretha Stapler  If 7PM-7AM, please contact night-coverage per Amion 06/06/2023, 8:30 AM

## 2023-06-07 DIAGNOSIS — I96 Gangrene, not elsewhere classified: Secondary | ICD-10-CM | POA: Diagnosis not present

## 2023-06-07 MED ORDER — CARVEDILOL 12.5 MG PO TABS
12.5000 mg | ORAL_TABLET | Freq: Two times a day (BID) | ORAL | Status: DC
  Administered 2023-06-07 – 2023-06-16 (×19): 12.5 mg via ORAL
  Filled 2023-06-07 (×19): qty 1

## 2023-06-07 MED ORDER — OXYCODONE HCL ER 10 MG PO T12A
20.0000 mg | EXTENDED_RELEASE_TABLET | Freq: Two times a day (BID) | ORAL | Status: DC
  Administered 2023-06-07 – 2023-06-16 (×19): 20 mg via ORAL
  Filled 2023-06-07 (×20): qty 2

## 2023-06-07 MED ORDER — OXYCODONE HCL 5 MG PO TABS
5.0000 mg | ORAL_TABLET | ORAL | Status: DC | PRN
  Administered 2023-06-07 – 2023-06-16 (×26): 10 mg via ORAL
  Filled 2023-06-07 (×26): qty 2

## 2023-06-07 NOTE — Progress Notes (Signed)
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 Oral abscesses noted, underwent oral surgery Wed, 4/24; Initiated fabrication of prosthesis for LLE on 6/3; PMH includes asthma, bioplar 1, depression, schizoaffective disorder, CVA, chronic CHF, homelessness, HTN, DM2, cocaine and cannabis use, and R knee disarticulation    PT Comments  Continuing work on functional mobility and activity tolerance;  Session focused on discussion and education re: LLE prosthesis fit, ideas and suggestions for better prosthesis fit; Emphasized to [place] the need to wear shrinkers or liner overnight to keep swelling at bay so he's not too big for the prosthesis socket; Reviewed that soft tissue massage and scar mobilization at ends of residual limbs will help with pain, too; Recommend Nazier perform massage and donning shrinkers before going to bed, and wear shrinkers overnight    Assistance Recommended at Discharge PRN  If plan is discharge home, recommend the following:  Can travel by private vehicle    Assist for transportation   Yes (Currently recommend a sliding board for car transfers)  Equipment Recommendations  Other (comment);Rolling walker (2 wheels) (tub bench, sliding board)    Recommendations for Other Services  (Check ins with Prosthetist)     Precautions / Restrictions Precautions Precautions: None Precaution Comments: R knee disrticulation, L BKA; Fall risk is present, but minimal Required Braces or Orthoses: Other Brace Other Brace: Highly recommend Thane sleep in a liner or shrinker to keep residual limb from swelling Restrictions Weight Bearing Restrictions: No RLE Weight Bearing: Weight bearing as tolerated LLE Weight Bearing: Weight bearing as tolerated     Mobility  Bed Mobility                     Transfers Overall transfer level:  (Modified independent with wc transfers)                      Ambulation/Gait                   Stairs             Wheelchair Mobility     Tilt Bed    Modified Rankin (Stroke Patients Only)       Balance     Sitting balance-Leahy Scale: Normal       Standing balance-Leahy Scale: Poor                              Cognition Arousal/Alertness: Awake/alert Behavior During Therapy: WFL for tasks assessed/performed Overall Cognitive Status: Within Functional Limits for tasks assessed (for simple mobility tasks)                                 General Comments: When Almanzo understands rationale for an exercise or activity, and that rationale is in line with his goals, he participates much better        Exercises      General Comments General comments (skin integrity, edema, etc.): Supprted pt's R elbow as he donned liner; noted that his L distal residual limb was swollen compared to previous sessions; discussed wearing liner or shrinker overnight; left printout to post in pt's room      Pertinent Vitals/Pain  Pain Assessment Pain Assessment: Faces Faces Pain Scale: Hurts little more Pain Location: L distal residual limb with weight bearing in prosthesis Pain Descriptors / Indicators: Grimacing Pain Intervention(s): Monitored during session    Home Living                          Prior Function            PT Goals (current goals can now be found in the care plan section) Acute Rehab PT Goals Patient Stated Goal: Be able to use his prosthesis; Get ready for new apartment; is interested in working on floor transfers PT Goal Formulation: With patient Time For Goal Achievement: 06/09/23 Potential to Achieve Goals: Good Progress towards PT goals: Progressing toward goals    Frequency    Min 2X/week      PT Plan Current plan remains  appropriate;Discharge plan needs to be updated    Co-evaluation              AM-PAC PT "6 Clicks" Mobility   Outcome Measure  Help needed turning from your back to your side while in a flat bed without using bedrails?: None Help needed moving from lying on your back to sitting on the side of a flat bed without using bedrails?: None Help needed moving to and from a bed to a chair (including a wheelchair)?: None Help needed standing up from a chair using your arms (e.g., wheelchair or bedside chair)?: Total Help needed to walk in hospital room?: Total Help needed climbing 3-5 steps with a railing? : Total 6 Click Score: 15    End of Session Equipment Utilized During Treatment: Other (comment);Gait belt (prosthesis liner) Activity Tolerance: Patient tolerated treatment well Patient left:  (managing independently) Nurse Communication: Mobility status PT Visit Diagnosis: Muscle weakness (generalized) (M62.81);Other abnormalities of gait and mobility (R26.89)     Time: 1610-9604 PT Time Calculation (min) (ACUTE ONLY): 32 min  Charges:    $Self Care/Home Management: 23-37 PT General Charges $$ ACUTE PT VISIT: 1 Visit                     Van Clines, PT  Acute Rehabilitation Services Office [phone] Secure Chat welcomed    [name] 06/07/2023, 5:42 PM

## 2023-06-07 NOTE — Progress Notes (Signed)
[name]  UJW:[id number] DOB: August 02, 1966 DOA: 12/07/2022 PCP: [name], NP    Brief Narrative:  57 year old with a history of chronic combined systolic and diastolic CHF, CAD, right BKA, COPD, HLD, polyneuropathy, and schizoaffective/bipolar disorder who was admitted to the hospital 12/07/2022 with left lower extremity cellulitis and gangrene and ultimately required a left BKA 12/10/2022.  During his hospitalization he has also been found to have maxillary abscesses and multiple periapical abscesses and required oral surgery with I&D of an anterior maxillary abscess and surgical extraction of multiple teeth with alveoloplasty.  The patient's hospital stay has been prolonged due to difficulty in safe placement related to his prior homelessness complicated by his new left BKA and prior right AKA.  Goals of Care:  Code Status: Full Code   DVT prophylaxis: Lovenox  Interim Hx: No acute events recorded overnight.  Afebrile.  Vital signs stable.  Assessment & Plan:  Sepsis due to cellulitis and gangrene of the left foot > status post left BKA Initially treated with IV vancomycin cefepime and Flagyl - ultimately underwent left BKA 12/10/2022 - has received a left limb prosthesis - Physical Therapy continues to work with the patient on use of prosthesis and ambulation  Housing instability Patient informed Dr. Benjamine Mola that he has established long-term housing at [name] and should be able to move in 06/11/2023  Anterior maxillary abscess -multiple periapical abscesses of teeth Status post I&D of anterior maxillary abscess and surgical extraction of numerous teeth as well as simple extraction of many other teeth and alveoloplasty x 4 quadrants 03/25/2023 - continue oral hygiene and benzocaine mucosal gel as needed  Unilateral hearing loss in setting of cerumen impaction Otoscope exam revealed severely cerumen impacted ear canal - case was discussed with ENT who suggested an  outpatient evaluation with continued flushing of the ears and use of Debrox - auditory acuity has improved significantly at this time  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 Well compensated presently - TTE September 2023 noted EF <20% with global hypokinesis - continue usual Imdur Lasix and Coreg - patient has been offered outpatient Cardiology evaluation on numerous occasions but has a 100% no-show rate for office visits - he was evaluated to some extent at [facility] in 2020 - noncompliance has made him ineligible for advanced heart failure therapies - appears euvolemic at present  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 Intention at that time was for the patient to remain on lifelong anticoagulation - Eliquis was discontinued 12/09/2022 apparently in anticipation of surgery and has not been resumed since - given his long history of noncompliance and his current bilateral lower extremity amputation status I feel the risk of ongoing anticoagulation outweighs the benefit at present  HTN Blood pressure reasonably well-controlled overall -increase beta-blocker today and attempt to achieve more consistent strict control  COPD Stable  Constipation Continue bowel regimen  Chronic pain appears comfortable on serial exam -with patient now well out from amputation will attempt to wean dose of chronic narcotics  Schizoaffective disorder/bipolar disorder Continue Lexapro and Abilify  Obesity - Body mass index is 29.3 kg/m.   Family Communication: No family present at time of exam Disposition: Anticipate discharge to home 06/11/2023   Objective: Blood pressure 131/72, pulse (!) 58, temperature 97.6 F (36.4 C), resp. rate 16, height 5\' 9"  (1.753 m), weight 90 kg, SpO2 97 %. No intake or output data in the 24 hours ending 06/07/23 0814  Filed Weights   05/09/23 0500 05/11/23 0428 05/12/23 0633  Weight: 90.7 kg 90 kg 90 kg     Examination: General: No acute distress -out exploring the unit in his wheelchair without difficulty   CBC: Recent Labs  Lab 06/05/23 0748  WBC 5.8  HGB 14.1  HCT 42.0  MCV 92.5  PLT 253    Basic Metabolic Panel: Recent Labs  Lab 06/05/23 0748  NA 138  K 3.8  CL 107  CO2 23  GLUCOSE 92  BUN 17  CREATININE 1.01  CALCIUM 8.9  MG 1.9    GFR: Estimated Creatinine Clearance: 91.6 mL/min (by C-G formula based on SCr of 1.01 mg/dL).   Scheduled Meds:  amLODipine  10 mg Oral Daily   ARIPiprazole  5 mg Oral Daily   vitamin C  1,000 mg Oral Daily   carvedilol  6.25 mg Oral BID WC   chlorhexidine  15 mL Mouth/Throat TID   enoxaparin (LOVENOX) injection  40 mg Subcutaneous Q24H   escitalopram  5 mg Oral Daily   furosemide  20 mg Oral Daily   gabapentin  900 mg Oral TID   isosorbide-hydrALAZINE  1 tablet Oral TID   melatonin  3 mg Oral QHS   multivitamin with minerals  1 tablet Oral Daily   oxyCODONE  30 mg Oral Q12H      LOS: 182 days   [name], MD [facility] Office  [phone] Pager - Text Page per Loretha Stapler  If 7PM-7AM, please contact night-coverage per Amion 06/07/2023, 8:14 AM

## 2023-06-07 NOTE — Plan of Care (Signed)
  Problem: Pain Managment: Goal: General experience of comfort will improve Outcome: Progressing   Problem: Safety: Goal: Ability to remain free from injury will improve Outcome: Progressing   Problem: Skin Integrity: Goal: Risk for impaired skin integrity will decrease Outcome: Progressing   Problem: Education: Goal: Knowledge of General Education information will improve Description: Including pain rating scale, medication(s)/side effects and non-pharmacologic comfort measures Outcome: Progressing   

## 2023-06-08 DIAGNOSIS — I96 Gangrene, not elsewhere classified: Secondary | ICD-10-CM | POA: Diagnosis not present

## 2023-06-08 NOTE — TOC Progression Note (Addendum)
Transition of Care [facility]) - Progression Note    Patient Details  Name: [name] MRN: [id number] Date of Birth: January 10, 1966  [facility]) CM/SW Contact  [name], [name] Phone Number: 06/08/2023, 9:24 AM  Clinical Narrative:    3pm: CSW submitted an ACTT referral to [name] per [facility] request.  1pm: CSW completed part A and B of the [facility] application via the online portal to assist patient with transportation needs.  9:20am: CSW spoke with [place] at [place] to request a list of in network home health agencies. [place] provided CSW with a document of service providers however they are all mental health based and not for home health.  CSW submitted [employer] application via online portal.   Expected Discharge Plan: Home/Self Care Barriers to Discharge: Homeless with medical needs  Expected Discharge Plan and Services   Discharge Planning Services: CM Consult Post Acute Care Choice: Home Health Living arrangements for the past 2 months: Homeless                                       Social Determinants of Health (SDOH) Interventions SDOH Screenings   Food Insecurity: Food Insecurity Present (11/24/2022)  Housing: Medium Risk (12/07/2022)  Transportation Needs: Unmet Transportation Needs (12/07/2022)  Utilities: Not At Risk (12/07/2022)  Alcohol Screen: Medium Risk (06/24/2019)  Depression (PHQ2-9): Medium Risk (02/10/2021)  Tobacco Use: High Risk (03/26/2023)    Readmission Risk Interventions    12/09/2022    9:02 AM 12/01/2022    3:56 PM 08/18/2022    9:00 AM  Readmission Risk Prevention Plan  Transportation Screening Complete Complete Complete  Medication Review Oceanographer) Complete Complete Complete  PCP or Specialist appointment within 3-5 days of discharge Complete Complete Complete  HRI or Home Care Consult Complete Complete Complete  SW Recovery Care/Counseling Consult Complete Complete Complete  Palliative Care  Screening Not Applicable Not Applicable Not Applicable  Skilled Nursing Facility Not Applicable Not Applicable Not Applicable  SNF Comments  n/a

## 2023-06-08 NOTE — Progress Notes (Signed)
[name]  ZOX:[id number] DOB: June 11, 1966 DOA: 12/07/2022 PCP: [name], NP    Brief Narrative:  57 year old with a history of chronic combined systolic and diastolic CHF, CAD, right BKA, COPD, HLD, polyneuropathy, and schizoaffective/bipolar disorder who was admitted to the hospital 12/07/2022 with left lower extremity cellulitis and gangrene and ultimately required a left BKA 12/10/2022.  During his hospitalization he has also been found to have maxillary abscesses and multiple periapical abscesses and required oral surgery with I&D of an anterior maxillary abscess and surgical extraction of multiple teeth with alveoloplasty.  The patient's hospital stay has been prolonged due to difficulty in safe placement related to his prior homelessness complicated by his new left BKA and prior right AKA.  Goals of Care:  Code Status: Full Code   DVT prophylaxis: Lovenox  Interim Hx: No acute events recorded overnight.  Afebrile.  Vital signs stable.  Continues to work with physical therapy on use of prosthesis.  No new complaints at time of visit today.  Assessment & Plan:  Sepsis due to cellulitis and gangrene of the left foot > status post left BKA Initially treated with IV vancomycin cefepime and Flagyl - ultimately underwent left BKA 12/10/2022 - has received a left limb prosthesis - Physical Therapy continues to work with the patient on use of prosthesis and ambulation  Housing instability Patient informed Dr. Benjamine Mola that he has established long-term housing at [name] and should be able to move in 06/11/2023  Anterior maxillary abscess -multiple periapical abscesses of teeth Status post I&D of anterior maxillary abscess and surgical extraction of numerous teeth as well as simple extraction of many other teeth and alveoloplasty x 4 quadrants 03/25/2023 - continue oral hygiene and benzocaine mucosal gel as needed  Unilateral hearing loss in setting of cerumen impaction Otoscope  exam revealed severely cerumen impacted ear canal - case was discussed with ENT who suggested an outpatient evaluation with continued flushing of the ears and use of Debrox - auditory acuity has improved significantly at this time  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 Well compensated presently - TTE September 2023 noted EF <20% with global hypokinesis - continue usual Imdur Lasix and Coreg - patient has been offered outpatient Cardiology evaluation on numerous occasions but has a 100% no-show rate for office visits - he was evaluated to some extent at [facility] in 2020 - noncompliance has made him ineligible for advanced heart failure therapies - appears euvolemic at present  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 Intention at that time was for the patient to remain on lifelong anticoagulation - Eliquis was discontinued 12/09/2022 apparently in anticipation of surgery and has not been resumed since - given his long history of noncompliance and his current bilateral lower extremity amputation status I feel the risk of ongoing anticoagulation outweighs the benefit at present  HTN Blood pressure reasonably well-controlled at this time  COPD Stable  Constipation Continue bowel regimen  Chronic pain appears comfortable on serial exam -with patient now well out from amputation will attempt to wean dose of chronic narcotics  Schizoaffective disorder/bipolar disorder Continue Lexapro and Abilify  Obesity - Body mass index is 29.3 kg/m.   Family Communication: No family present at time of exam Disposition: Anticipate discharge to home 06/11/2023   Objective: Blood pressure 115/74, pulse 73, temperature 98.2 F (36.8 C), resp. rate 17, height 5\' 9"  (1.753 m), weight 90 kg, SpO2 96 %.  Intake/Output Summary (  Last 24 hours) at 06/08/2023 0820 Last data filed at 06/07/2023 1524 Gross per 24 hour  Intake 720 ml  Output --  Net 720 ml    Filed  Weights   05/09/23 0500 05/11/23 0428 05/12/23 0633  Weight: 90.7 kg 90 kg 90 kg    Examination: General: No acute respiratory distress Lungs: Clear to auscultation bilaterally without wheezes or crackles Cardiovascular: Regular rate and rhythm without murmur gallop or rub normal S1 and S2 Abdomen: Nontender, nondistended, soft, bowel sounds positive, no rebound, no ascites, no appreciable mass Extremities: No significant cyanosis, clubbing, or edema bilateral lower extremities    CBC: Recent Labs  Lab 06/05/23 0748  WBC 5.8  HGB 14.1  HCT 42.0  MCV 92.5  PLT 253    Basic Metabolic Panel: Recent Labs  Lab 06/05/23 0748  NA 138  K 3.8  CL 107  CO2 23  GLUCOSE 92  BUN 17  CREATININE 1.01  CALCIUM 8.9  MG 1.9    GFR: Estimated Creatinine Clearance: 91.6 mL/min (by C-G formula based on SCr of 1.01 mg/dL).   Scheduled Meds:  amLODipine  10 mg Oral Daily   ARIPiprazole  5 mg Oral Daily   vitamin C  1,000 mg Oral Daily   carvedilol  12.5 mg Oral BID WC   chlorhexidine  15 mL Mouth/Throat TID   enoxaparin (LOVENOX) injection  40 mg Subcutaneous Q24H   escitalopram  5 mg Oral Daily   furosemide  20 mg Oral Daily   gabapentin  900 mg Oral TID   isosorbide-hydrALAZINE  1 tablet Oral TID   melatonin  3 mg Oral QHS   multivitamin with minerals  1 tablet Oral Daily   oxyCODONE  20 mg Oral Q12H      LOS: 183 days   [name], MD [facility] Office  [phone] Pager - Text Page per Loretha Stapler  If 7PM-7AM, please contact night-coverage per Amion 06/08/2023, 8:20 AM

## 2023-06-08 NOTE — Plan of Care (Signed)
  Problem: Pain Managment: Goal: General experience of comfort will improve Outcome: Progressing   Problem: Safety: Goal: Ability to remain free from injury will improve Outcome: Progressing   Problem: Skin Integrity: Goal: Risk for impaired skin integrity will decrease Outcome: Progressing   

## 2023-06-09 NOTE — TOC Progression Note (Signed)
Transition of Care [facility]) - Progression Note    Patient Details  Name: [name] MRN: [id number] Date of Birth: May 25, 1966  Transition of Care [facility]) CM/SW Contact  [name], [name] Phone Number: 06/09/2023, 1:46 PM  Clinical Narrative:    CSW spoke with [name] of [facility] who states an RN will assess patient on 06/17/23 at 8:30am with [name]. [name] will contact patient to inform him if any changes occur.  CSW updated [facility] staff with information and requested an update regarding patient's discharge date.  CSW attempted to reach [place] in eligibility at [employer] without success - a voicemail was left requesting a return call.   Expected Discharge Plan: Home/Self Care Barriers to Discharge: Homeless with medical needs  Expected Discharge Plan and Services   Discharge Planning Services: CM Consult Post Acute Care Choice: Home Health Living arrangements for the past 2 months: Homeless                                       Social Determinants of Health (SDOH) Interventions SDOH Screenings   Food Insecurity: Food Insecurity Present (11/24/2022)  Housing: Medium Risk (12/07/2022)  Transportation Needs: Unmet Transportation Needs (12/07/2022)  Utilities: Not At Risk (12/07/2022)  Alcohol Screen: Medium Risk (06/24/2019)  Depression (PHQ2-9): Medium Risk (02/10/2021)  Tobacco Use: High Risk (03/26/2023)    Readmission Risk Interventions    12/09/2022    9:02 AM 12/01/2022    3:56 PM 08/18/2022    9:00 AM  Readmission Risk Prevention Plan  Transportation Screening Complete Complete Complete  Medication Review Oceanographer) Complete Complete Complete  PCP or Specialist appointment within 3-5 days of discharge Complete Complete Complete  HRI or Home Care Consult Complete Complete Complete  SW Recovery Care/Counseling Consult Complete Complete Complete  Palliative Care Screening Not Applicable Not Applicable Not Applicable  Skilled Nursing Facility Not  Applicable Not Applicable Not Applicable  SNF Comments  n/a

## 2023-06-09 NOTE — Progress Notes (Signed)
MEWS Progress Note  Patient Details Name: [name] MRN: [id number] DOB: 06-25-1966 Today's Date: 06/09/2023   MEWS Flowsheet Documentation:    Provider Notification Provider Name/Title: MD Sharon Seller Date Provider Notified: 06/09/23 Time Provider Notified: 1816 Method of Notification: Page (text page) Notification Reason: Red med refusal Provider response: No new orders Date of Provider Response: 06/09/23 Time of Provider Response: 1817      [name] [name] 06/09/2023, 6:18 PM

## 2023-06-09 NOTE — Progress Notes (Signed)
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  During his hospitalization he has also been found to have maxillary abscesses and multiple periapical abscesses and required oral surgery with I&D of an anterior maxillary abscess and surgical extraction of multiple teeth with alveoloplasty.  The patient's hospital stay has been prolonged due to difficulty in safe placement related to his prior homelessness complicated by his new left BKA and prior right AKA.  Goals of Care:  Code Status: Full Code   DVT prophylaxis: Lovenox  Interim Hx: Afebrile.  Vitals have been stable.  No acute events recorded overnight.  Assessment & Plan:  Sepsis due to cellulitis and gangrene of the left foot > status post left BKA Initially treated with IV vancomycin cefepime and Flagyl - ultimately underwent left BKA 12/10/2022 - has received a left limb prosthesis - Physical Therapy continues to work with the patient on use of prosthesis and ambulation  Housing instability Patient informed Dr. Benjamine Mola that he has established long-term housing at [name] and should be able to move in 06/11/2023 - plan is for d/c 06/11/23  Anterior maxillary abscess -multiple periapical abscesses of teeth Status post I&D of anterior maxillary abscess and surgical extraction of numerous teeth as well as simple extraction of many other teeth and alveoloplasty x 4 quadrants 03/25/2023 - continue oral hygiene and benzocaine mucosal gel as needed  Unilateral hearing loss in setting of cerumen impaction Otoscope exam revealed severely cerumen impacted ear canal - case was discussed with  ENT who suggested an outpatient evaluation with continued flushing of the ears and use of Debrox - auditory acuity has improved significantly at this time  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 Well compensated presently - TTE September 2023 noted EF <20% with global hypokinesis - continue usual Imdur Lasix and Coreg - patient has been offered outpatient Cardiology evaluation on numerous occasions but has a 100% no-show rate for office visits - he was evaluated to some extent at [facility] in 2020 - noncompliance has made him ineligible for advanced heart failure therapies - no difficulty with signif volume overload during this hospitalization thus far  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 Intention at that time was for the patient to remain on lifelong anticoagulation - Eliquis was discontinued 12/09/2022 apparently in anticipation of surgery and has not been resumed since - given his long history of noncompliance and his current bilateral lower extremity amputation status I feel the risk of ongoing anticoagulation outweighs the benefit at present  HTN Blood pressure reasonably well-controlled at this time  COPD Stable  Constipation Continue bowel regimen  Chronic pain appears comfortable on serial exam -with patient now well out from amputation will attempt to wean dose of chronic narcotics  Schizoaffective disorder/bipolar disorder Continue Lexapro and Abilify  Obesity - Body mass index is 29.3 kg/m.   Family Communication: No family present at time of exam Disposition: Anticipate discharge to home 06/11/2023   Objective: Blood pressure (!) 157/86, pulse 71, temperature 98.2 F (36.8 C), temperature source Oral, resp. rate 18, height 5\' 9"  (1.753 m), weight 90 kg, SpO2 98 %.  Intake/Output Summary (Last 24  hours) at 06/09/2023 0905 Last data filed at 06/08/2023 2308 Gross per 24 hour  Intake 300 ml  Output --  Net 300 ml     Filed Weights   05/09/23 0500 05/11/23 0428 05/12/23 0633  Weight: 90.7 kg 90 kg 90 kg    Examination: General: No acute distress  CBC: Recent Labs  Lab 06/05/23 0748  WBC 5.8  HGB 14.1  HCT 42.0  MCV 92.5  PLT 253    Basic Metabolic Panel: Recent Labs  Lab 06/05/23 0748  NA 138  K 3.8  CL 107  CO2 23  GLUCOSE 92  BUN 17  CREATININE 1.01  CALCIUM 8.9  MG 1.9    GFR: Estimated Creatinine Clearance: 91.6 mL/min (by C-G formula based on SCr of 1.01 mg/dL).   Scheduled Meds:  amLODipine  10 mg Oral Daily   ARIPiprazole  5 mg Oral Daily   vitamin C  1,000 mg Oral Daily   carvedilol  12.5 mg Oral BID WC   chlorhexidine  15 mL Mouth/Throat TID   enoxaparin (LOVENOX) injection  40 mg Subcutaneous Q24H   escitalopram  5 mg Oral Daily   furosemide  20 mg Oral Daily   gabapentin  900 mg Oral TID   isosorbide-hydrALAZINE  1 tablet Oral TID   melatonin  3 mg Oral QHS   multivitamin with minerals  1 tablet Oral Daily   oxyCODONE  20 mg Oral Q12H      LOS: 184 days   [name], MD [facility] Office  [phone] Pager - Text Page per Loretha Stapler  If 7PM-7AM, please contact night-coverage per Amion 06/09/2023, 9:05 AM

## 2023-06-09 NOTE — Progress Notes (Signed)
Physical Therapy Treatment Patient Details Name: [name] MRN: [id number] DOB: [birth year] Today's Date: 06/09/2023   History of Present Illness Pt is a 57 y/o M who presented 12/17/22 with L ankle cellulitis and underwent L BKA on 1/10. Oral abscesses noted, underwent oral surgery Wed, 4/24; Initiated fabrication of prosthesis for LLE on 6/3; recieved LLE prosthesis on 6/28; PMH includes asthma, bioplar 1, depression, schizoaffective disorder, CVA, chronic CHF, homelessness, HTN, DM2, cocaine and cannabis use, and R knee disarticulation    PT Comments  Continuing work on functional mobility and activity tolerance;  Met with Prosthetist again for LLE fit check and RLE casting for prosthesis; Began session with [name] doffing and then donning LLE prosthesis without physical assist by supporting the LLE on a bench, holding the end of the socket, and pushing in;   Then Buford stood at the parallel bars x3; he's showing carryover for L foot positioning; Last bout in standing lasted over 4 minutes, so that he could get his RLE casted; he needed more assist with incr time in standing, but showed excellent effort;   Also noteworthy that [facility] tolerated donning a liner RLE well, without the extreme tenderness noted earlier; He indicated he thinks the soft tissue mobilization/scar massage is helping    Assistance Recommended at Discharge PRN  If plan is discharge home, recommend the following:  Can travel by private vehicle    Assist for transportation   Yes (Currently recommend a sliding board for car transfers)  Equipment Recommendations  Other (comment);Rolling walker (2 wheels) (tub bench, sliding board)    Recommendations for Other Services  (Check ins with Prosthetist)     Precautions / Restrictions Precautions Precautions: None Precaution Comments: R knee disrticulation, L BKA; Fall risk is present, but minimal Required Braces or Orthoses: Other Brace Other Brace: Highly recommend  [place] sleep in a liner or shrinker to keep residual limb from swelling Restrictions Weight Bearing Restrictions: No RLE Weight Bearing: Weight bearing as tolerated LLE Weight Bearing: Weight bearing as tolerated     Mobility  Bed Mobility                    Transfers Overall transfer level: Needs assistance (Modified independent with wc transfers) Equipment used:  (Parallel bars) Transfers: Sit to/from Stand Sit to Stand: Min assist, +2 physical assistance           General transfer comment: Cues to postion L foot closer to pts' base of support; stood x3, including standing for over 4 minutes (as he was casted for R prosthesis); fatigued with lengthy stand and needed more support/assist    Ambulation/Gait                   Stairs             Wheelchair Mobility     Tilt Bed    Modified Rankin (Stroke Patients Only)       Balance     Sitting balance-Leahy Scale: Normal       Standing balance-Leahy Scale: Poor                              Cognition Arousal/Alertness: Awake/alert Behavior During Therapy: WFL for tasks assessed/performed Overall Cognitive Status: Within Functional Limits for tasks assessed (for simple mobility tasks)  General Comments: When Estes understands rationale for an exercise or activity, and that rationale is in line with his goals, he participates much better        Exercises      General Comments        Pertinent Vitals/Pain Pain Assessment Pain Assessment: Faces Faces Pain Scale: Hurts a little bit Pain Location: L distal residual limb with weight bearing in prosthesis Pain Descriptors / Indicators: Grimacing Pain Intervention(s): Monitored during session    Home Living                          Prior Function            PT Goals (current goals can now be found in the care plan section) Acute Rehab PT Goals Patient Stated  Goal: Be able to use his prosthesis; Get ready for new apartment; is interested in working on floor transfers PT Goal Formulation: With patient Time For Goal Achievement: 06/09/23 Potential to Achieve Goals: Good Progress towards PT goals: Progressing toward goals    Frequency    Min 2X/week      PT Plan Current plan remains appropriate;Discharge plan needs to be updated    Co-evaluation              AM-PAC PT "6 Clicks" Mobility   Outcome Measure  Help needed turning from your back to your side while in a flat bed without using bedrails?: None Help needed moving from lying on your back to sitting on the side of a flat bed without using bedrails?: None Help needed moving to and from a bed to a chair (including a wheelchair)?: None Help needed standing up from a chair using your arms (e.g., wheelchair or bedside chair)?: Total Help needed to walk in hospital room?: Total Help needed climbing 3-5 steps with a railing? : Total 6 Click Score: 15    End of Session Equipment Utilized During Treatment: Other (comment);Gait belt (prosthesis liner) Activity Tolerance: Patient tolerated treatment well Patient left:  (managing independently) Nurse Communication: Mobility status PT Visit Diagnosis: Muscle weakness (generalized) (M62.81);Other abnormalities of gait and mobility (R26.89)     Time: 3086-5784 PT Time Calculation (min) (ACUTE ONLY): 29 min  Charges:    $Therapeutic Activity: 8-22 mins $Orthotics/Prosthetics Check: 8-22 mins PT General Charges $$ ACUTE PT VISIT: 1 Visit                     Van Clines, PT  Acute Rehabilitation Services Office [phone] Secure Chat welcomed    [name] 06/09/2023, 6:16 PM

## 2023-06-09 NOTE — Progress Notes (Signed)
Occupational Therapy Treatment Patient Details Name: [name] MRN: [id number] DOB: 10-15-1966 Today's Date: 06/09/2023   History of present illness Pt is a 57 y/o M who presented 12/17/22 with L ankle cellulitis and underwent L BKA on 1/10. Oral abscesses noted, underwent oral surgery Wed, 4/24; Initiated fabrication of prosthesis for LLE on 6/3; PMH includes asthma, bioplar 1, depression, schizoaffective disorder, CVA, chronic CHF, homelessness, HTN, DM2, cocaine and cannabis use, and R knee disarticulation   OT comments  Patient up in wheelchair, in gown, and states had completed bathing. Patient able to donn shirt seated in wheelchair and required min assist with donning short to pull up while performing lateral leaning. Patient wearing shrinker and was able to doff and donn prosthesis liner with supervision and increased time. Patient attempted to donn prosthesis but had difficulty with locking in leg and required min assist to complete. Patient pleased with his performance and believes wearing shrinker over night helped. Acute OT to continue to follow to prepare patient for discharge.    Recommendations for follow up therapy are one component of a multi-disciplinary discharge planning process, led by the attending physician.  Recommendations may be updated based on patient status, additional functional criteria and insurance authorization.    Assistance Recommended at Discharge Intermittent Supervision/Assistance  Patient can return home with the following  A little help with walking and/or transfers   Equipment Recommendations  Other (comment) (pending further assessment)    Recommendations for Other Services      Precautions / Restrictions Precautions Precautions: None Precaution Comments: R knee disrticulation, L BKA; Fall risk is present, but minimal Required Braces or Orthoses: Other Brace Other Brace: left LE prosthesis Restrictions Weight Bearing Restrictions: No RLE  Weight Bearing: Weight bearing as tolerated LLE Weight Bearing: Weight bearing as tolerated       Mobility Bed Mobility Overal bed mobility: Independent             General bed mobility comments: OOB in wheelchair    Transfers Overall transfer level: Needs assistance                 General transfer comment: OOB in wheelchair     Balance                                           ADL either performed or assessed with clinical judgement   ADL Overall ADL's : Needs assistance/impaired                 Upper Body Dressing : Set up;Sitting   Lower Body Dressing: Minimal assistance;Sitting/lateral leans Lower Body Dressing Details (indicate cue type and reason): required assistance to pull up pants in wheelchair with lateral leaning. assistance to complete donning left prosthesis               General ADL Comments: Patient able to get liner on LLE and prosthesis but required assistance with getting prosthesis to click in    Extremity/Trunk Assessment              Vision       Perception     Praxis      Cognition Arousal/Alertness: Awake/alert Behavior During Therapy: WFL for tasks assessed/performed Overall Cognitive Status: Within Functional Limits for tasks assessed  General Comments: required encouragment to address donning prosthesis        Exercises      Shoulder Instructions       General Comments demonstrated gains in donning prosthesis, required assistance to complete    Pertinent Vitals/ Pain       Pain Assessment Pain Assessment: Faces Faces Pain Scale: Hurts a little bit Pain Location: discomfort, grimacing when donning left prosthesis Pain Descriptors / Indicators: Discomfort, Grimacing Pain Intervention(s): Monitored during session  Home Living                                          Prior Functioning/Environment               Frequency  Min 2X/week        Progress Toward Goals  OT Goals(current goals can now be found in the care plan section)  Progress towards OT goals: Progressing toward goals  Acute Rehab OT Goals Patient Stated Goal: discharge from hospital OT Goal Formulation: With patient Time For Goal Achievement: 06/15/23 Potential to Achieve Goals: Good ADL Goals Pt Will Perform Upper Body Bathing: with modified independence;sitting Pt Will Perform Lower Body Bathing: with modified independence;sitting/lateral leans Pt Will Perform Lower Body Dressing: sit to/from stand;with min guard assist Pt Will Transfer to Toilet: with modified independence;grab bars Pt Will Perform Toileting - Clothing Manipulation and hygiene: with modified independence;sitting/lateral leans Pt/caregiver will Perform Home Exercise Program: Right Upper extremity;Independently Additional ADL Goal #1: Pt will don/doff shrinker independently  Plan Discharge plan remains appropriate;Frequency needs to be updated    Co-evaluation                 AM-PAC OT "6 Clicks" Daily Activity     Outcome Measure   Help from another person eating meals?: None Help from another person taking care of personal grooming?: None Help from another person toileting, which includes using toliet, bedpan, or urinal?: None Help from another person bathing (including washing, rinsing, drying)?: None Help from another person to put on and taking off regular upper body clothing?: None Help from another person to put on and taking off regular lower body clothing?: None 6 Click Score: 24    End of Session Equipment Utilized During Treatment: Other (comment) (wheelchair)  OT Visit Diagnosis: Other symptoms and signs involving the nervous system (R29.898) Pain - Right/Left: Left Pain - part of body: Leg   Activity Tolerance Patient tolerated treatment well   Patient Left Other (comment);with family/visitor present (in wheelchair)    Nurse Communication Mobility status        Time: 1610-9604 OT Time Calculation (min): 20 min  Charges: OT General Charges $OT Visit: 1 Visit OT Treatments $Self Care/Home Management : 8-22 mins  .[email or web address]  Dewain Penning 06/09/2023, 11:58 AM

## 2023-06-10 DIAGNOSIS — Z89611 Acquired absence of right leg above knee: Secondary | ICD-10-CM | POA: Diagnosis not present

## 2023-06-10 DIAGNOSIS — I96 Gangrene, not elsewhere classified: Secondary | ICD-10-CM | POA: Diagnosis not present

## 2023-06-10 DIAGNOSIS — L03116 Cellulitis of left lower limb: Secondary | ICD-10-CM | POA: Diagnosis not present

## 2023-06-10 NOTE — TOC Progression Note (Incomplete Revision)
Transition of Care [facility]) - Progression Note    Patient Details  Name: [name] MRN: [id number] Date of Birth: [birth year]  Transition of Care [facility]) CM/SW Contact  [name], [name] Phone Number: 06/10/2023, 11:05 AM  Clinical Narrative:    1:30pm: CSW made additional call to [name] to ask secretary to have intake worker call CSW ASAP due to the importance of services being in place prior to discharge.  12pm: CSW attempted to reach [place] in eligibility at [facility] without success - a voicemail was left requesting a return call.   11am: CSW received email from [facility] staff stating patient's discharge has been delayed due to inability to secure an ACTT team prior to discharge.  CSW spoke with patient to inform him of information.  CSW attempted to reach staff at [name] without success - a voicemail was left requesting a return call.   Expected Discharge Plan: Home/Self Care Barriers to Discharge: Homeless with medical needs  Expected Discharge Plan and Services   Discharge Planning Services: CM Consult Post Acute Care Choice: Home Health Living arrangements for the past 2 months: Homeless                                       Social Determinants of Health (SDOH) Interventions SDOH Screenings   Food Insecurity: Food Insecurity Present (11/24/2022)  Housing: Medium Risk (12/07/2022)  Transportation Needs: Unmet Transportation Needs (12/07/2022)  Utilities: Not At Risk (12/07/2022)  Alcohol Screen: Medium Risk (06/24/2019)  Depression (PHQ2-9): Medium Risk (02/10/2021)  Tobacco Use: High Risk (03/26/2023)    Readmission Risk Interventions    12/09/2022    9:02 AM 12/01/2022    3:56 PM 08/18/2022    9:00 AM  Readmission Risk Prevention Plan  Transportation Screening Complete Complete Complete  Medication Review Oceanographer) Complete Complete Complete  PCP or Specialist appointment within 3-5 days of discharge Complete Complete  Complete  HRI or Home Care Consult Complete Complete Complete  SW Recovery Care/Counseling Consult Complete Complete Complete  Palliative Care Screening Not Applicable Not Applicable Not Applicable  Skilled Nursing Facility Not Applicable Not Applicable Not Applicable  SNF Comments  n/a

## 2023-06-10 NOTE — TOC Progression Note (Addendum)
Transition of Care (TOC) - Progression Note    Patient Details  Name: [name] MRN: [id number] Date of Birth: 05/16/1966  Transition of Care (TOC) CM/SW Contact  Aleynah Rocchio L Myer Bohlman, [name] Phone Number: 06/10/2023, 11:05 AM  Clinical Narrative:    1:30pm: CSW made additional call to [name] to ask secretary to have intake worker call CSW ASAP due to the importance of services being in place prior to discharge.  12pm: CSW attempted to reach [place] in eligibility at [facility] without success - a voicemail was left requesting a return call.   11am: CSW received email from [name] staff stating patient's discharge has been delayed due to inability to secure an ACTT team prior to discharge.  CSW spoke with patient to inform him of information.  CSW attempted to reach staff at [name] without success - a voicemail was left requesting a return call.   Expected Discharge Plan: Home/Self Care Barriers to Discharge: Homeless with medical needs  Expected Discharge Plan and Services   Discharge Planning Services: CM Consult Post Acute Care Choice: Home Health Living arrangements for the past 2 months: Homeless                                       Social Determinants of Health (SDOH) Interventions SDOH Screenings   Food Insecurity: Food Insecurity Present (11/24/2022)  Housing: Medium Risk (12/07/2022)  Transportation Needs: Unmet Transportation Needs (12/07/2022)  Utilities: Not At Risk (12/07/2022)  Alcohol Screen: Medium Risk (06/24/2019)  Depression (PHQ2-9): Medium Risk (02/10/2021)  Tobacco Use: High Risk (03/26/2023)    Readmission Risk Interventions    12/09/2022    9:02 AM 12/01/2022    3:56 PM 08/18/2022    9:00 AM  Readmission Risk Prevention Plan  Transportation Screening Complete Complete Complete  Medication Review (RN Care Manager) Complete Complete Complete  PCP or Specialist appointment within 3-5 days of discharge Complete Complete  Complete  HRI or Home Care Consult Complete Complete Complete  SW Recovery Care/Counseling Consult Complete Complete Complete  Palliative Care Screening Not Applicable Not Applicable Not Applicable  Skilled Nursing Facility Not Applicable Not Applicable Not Applicable  SNF Comments  n/a     

## 2023-06-10 NOTE — Progress Notes (Signed)
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 Oral abscesses noted, underwent oral surgery Wed, 4/24; Initiated fabrication of prosthesis for LLE on 6/3; recieved LLE prosthesis on 6/28; PMH includes asthma, bioplar 1, depression, schizoaffective disorder, CVA, chronic CHF, homelessness, HTN, DM2, cocaine and cannabis use, and R knee disarticulation    PT Comments  Continuing work on functional mobility and activity tolerance;  [name] expressed frustration that his dc had to be moved -- still, even with this disappointment, he worked very hard today;   Began with attempts at independent donning of prosthesis, and [name] used all of the ideas/technqiues we have tried in previous sessions, but still unbale to get prosthesis locked on; opted to try using bodyweight on it 'click in', and [name] sat on very elevated bed to essentially lower into the prosthesis with success; performed serial stands on L prosthesis from elevated EOB with the best upright posture observed so far;   Then focused on wheelchair and legrest management, and [name] managed the L legrest with mod cues;   Considering success getting prosthesis on without physical assist by standing into it, I wonder if getting him a grab bar mounted high on a wall or a pull up bar installed will be useful for him    Assistance Recommended at Discharge PRN  If plan is discharge home, recommend the following:  Can travel by private vehicle    Assist for transportation   Yes (Currently recommend a sliding board for car transfers)  Equipment Recommendations  Other (comment);Rolling walker (2 wheels) (tub bench, sliding board)    Recommendations for Other Services  (Check ins with Prosthetist)     Precautions / Restrictions Precautions Precautions:  None Precaution Comments: R knee disrticulation, L BKA; Fall risk is present, but minimal Required Braces or Orthoses: Other Brace Other Brace: Highly recommend [name] sleep in a liner or shrinker to keep residual limb from swelling Restrictions Weight Bearing Restrictions: No RLE Weight Bearing: Weight bearing as tolerated LLE Weight Bearing: Weight bearing as tolerated     Mobility  Bed Mobility                    Transfers Overall transfer level: Needs assistance (Modified independent with wc transfers) Equipment used: Rolling walker (2 wheels) (and bed height feature) Transfers: Sit to/from Stand Sit to Stand: Min guard, Min assist     Squat pivot transfers: Supervision     General transfer comment: Stood from extra-elevated bed onto prosthesis to get it completely 'clicked in' and locked with min assist to steady RW mostly; after prosthesis was correctly on, stood x2 from very high bed, and then 1 more time from bed lowered approx 2 inches; cues to fully extend hips in standing with good success; Also performed bed to wheelchair transfer with prosthesis on, and [name] was able to accept more weight onto the prosthesis, and use it as a pivot point    Ambulation/Gait                   Psychologist, counselling mobility: Yes Wheelchair parts: Psychologist, sport and exercise Details (indicate cue type and reason): Focused on managing legrest for L LE/prosthesis; this PT demonstrated how to put the legrest  on, lock into place, and then unlock, swing away, and take the legrest off the wc; Able to put legrest on and off with supervision and cues   Tilt Bed    Modified Rankin (Stroke Patients Only)       Balance     Sitting balance-Leahy Scale: Normal       Standing balance-Leahy Scale: Poor                              Cognition Arousal/Alertness: Awake/alert Behavior During  Therapy: WFL for tasks assessed/performed Overall Cognitive Status: Within Functional Limits for tasks assessed (for simple mobility tasks)                                 General Comments: When [name] understands rationale for an exercise or activity, and that rationale is in line with his goals, he participates much better        Exercises      General Comments General comments (skin integrity, edema, etc.): Good tolerance with estim      Pertinent Vitals/Pain Pain Assessment Pain Assessment: Faces Faces Pain Scale: Hurts a little bit Pain Location: L distal residual limb with weight bearing in prosthesis Pain Descriptors / Indicators: Grimacing Pain Intervention(s): Monitored during session    Home Living                          Prior Function            PT Goals (current goals can now be found in the care plan section) Acute Rehab PT Goals Patient Stated Goal: Be able to use his prosthesis; Get ready for new apartment; is interested in working on floor transfers PT Goal Formulation: With patient Time For Goal Achievement: 06/09/23 Potential to Achieve Goals: Good Progress towards PT goals: Progressing toward goals    Frequency    Min 2X/week      PT Plan Current plan remains appropriate    Co-evaluation              AM-PAC PT "6 Clicks" Mobility   Outcome Measure  Help needed turning from your back to your side while in a flat bed without using bedrails?: None Help needed moving from lying on your back to sitting on the side of a flat bed without using bedrails?: None Help needed moving to and from a bed to a chair (including a wheelchair)?: None Help needed standing up from a chair using your arms (e.g., wheelchair or bedside chair)?: A Lot Help needed to walk in hospital room?: Total Help needed climbing 3-5 steps with a railing? : Total 6 Click Score: 16    End of Session Equipment Utilized During Treatment: Other  (comment);Gait belt (prosthesis) Activity Tolerance: Patient tolerated treatment well Patient left:  (managing independently) Nurse Communication: Mobility status PT Visit Diagnosis: Muscle weakness (generalized) (M62.81);Other abnormalities of gait and mobility (R26.89)     Time: 1610-9604 PT Time Calculation (min) (ACUTE ONLY): 46 min  Charges:    $Therapeutic Activity: 23-37 mins $Wheel Chair Management: 8-22 mins PT General Charges $$ ACUTE PT VISIT: 1 Visit                     Van Clines, PT  Acute Rehabilitation Services Office [phone] Secure Chat welcomed    [name] 06/10/2023,  5:39 PM

## 2023-06-10 NOTE — Progress Notes (Signed)
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 Oral abscesses noted, underwent oral surgery Wed, 4/24; Initiated fabrication of prosthesis for LLE on 6/3; recieved LLE prosthesis on 6/28; PMH includes asthma, bioplar 1, depression, schizoaffective disorder, CVA, chronic CHF, homelessness, HTN, DM2, cocaine and cannabis use, and R knee disarticulation   OT comments  Pt reported improved coordination in RUE following the use of estim last week, trialed estim again with emphasis on isolated muscle contractions. Following the use of estim, pt engaged in dynamic reaching task with OT to assess the benefits. No significant difference noted directly following its use. Discussed with pt that we will be shifting focus on strategies and tools to keep RUE support during functional use to reduce the occurrence of spams in RUE, pt verbalized understanding. Pt left session prematurely to answer his room phone, OT to continue to progress pt as able. DC plans remain appropriate as listed below.    Recommendations for follow up therapy are one component of a multi-disciplinary discharge planning process, led by the attending physician.  Recommendations may be updated based on patient status, additional functional criteria and insurance authorization.    Assistance Recommended at Discharge Intermittent Supervision/Assistance  Patient can return home with the following  A little help with walking and/or transfers   Equipment Recommendations  Other (comment) (pending further assessment)    Recommendations for Other Services      Precautions / Restrictions Precautions Precautions: None Precaution Comments: R knee disrticulation, L BKA; Fall risk is present, but minimal Required Braces or Orthoses: Other Brace Other  Brace: Highly recommend [name] in a liner or shrinker to keep residual limb from swelling Restrictions Weight Bearing Restrictions: No RLE Weight Bearing: Weight bearing as tolerated LLE Weight Bearing: Weight bearing as tolerated       Mobility Bed Mobility                    Transfers                         Balance                                           ADL either performed or assessed with clinical judgement   ADL                                         General ADL Comments: Focused session on the use of estim to isolate muscle contractions as pt report improved coordination following first use of estim. Wrist ext set to 60 hz while flexors set to 39.5 with and emphasis on mind muscle connection with contractions,    Extremity/Trunk Assessment              Vision       Perception     Praxis      Cognition Arousal/Alertness: Awake/alert Behavior During Therapy: WFL for tasks assessed/performed Overall Cognitive Status: Within Functional Limits for tasks assessed  Exercises      Shoulder Instructions       General Comments Good tolerance with estim    Pertinent Vitals/ Pain       Pain Assessment Pain Assessment: No/denies pain  Home Living                                          Prior Functioning/Environment              Frequency  Min 2X/week        Progress Toward Goals  OT Goals(current goals can now be found in the care plan section)  Progress towards OT goals: Progressing toward goals  Acute Rehab OT Goals Patient Stated Goal: discharge from hospital OT Goal Formulation: With patient Time For Goal Achievement: 06/15/23 Potential to Achieve Goals: Good  Plan Discharge plan remains appropriate;Frequency needs to be updated    Co-evaluation                 AM-PAC OT "6 Clicks"  Daily Activity     Outcome Measure   Help from another person eating meals?: None Help from another person taking care of personal grooming?: None Help from another person toileting, which includes using toliet, bedpan, or urinal?: None Help from another person bathing (including washing, rinsing, drying)?: None Help from another person to put on and taking off regular upper body clothing?: None   6 Click Score: 20    End of Session Equipment Utilized During Treatment: Other (comment) (Empi estim and W/c)  OT Visit Diagnosis: Other symptoms and signs involving the nervous system (R29.898) Pain - Right/Left: Left Pain - part of body: Leg   Activity Tolerance Patient tolerated treatment well   Patient Left Other (comment) (pt departed therapy session prematurely to get to room phone in room. Pt seen in room on the phone)   Nurse Communication Mobility status        Time: 1610-9604 OT Time Calculation (min): 22 min  Charges: OT General Charges $OT Visit: 1 Visit OT Treatments $Neuromuscular Re-education: 8-22 mins  06/10/2023  AB, OTR/L  Acute Rehabilitation Services  Office: [phone]   Tristan Schroeder 06/10/2023, 5:06 PM

## 2023-06-10 NOTE — Progress Notes (Signed)
Progress Note   Patient: [name] BJY:[id number] DOB: 1966/01/02 DOA: 12/07/2022     185 DOS: the patient was seen and examined on 06/10/2023   Brief hospital course: [name] is a 57 y.o. male with a history of heart failure, CAD, right BKA, COPD, hyperlipidemia, hyperlipidemia, polyneuropathy, schizoaffective/bipolar disorder, was admitted to the hospital with left lower extremity cellulitis and gangrene.   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      During hospitalization, patient was also noted to have maxillary abscess and periapical abscess and underwent oral surgery with I&D and of anterior maxillary abscess and surgical extraction of multiple teeth in alveoloplasty. Currently stable.   Hospital stay prolonged due to difficulty in placement due to homelessness status status post left BKA and right AKA.   [facility],  have offered to provide a prosthesis for LLE (the BKA)   The patient scheduled an appointment Monday (June 3rd, 2024) at noon to meet up with patient and the Prosthetist to cast the L residual limb and make a plan and patient to receive a prosthesis for his left leg pro bono.  **Patient met with the Prosthetist for his Left Leg who brought initial test socket and will bring another test socket this week (05/18/23)and the patient is to wear the shrinker at all times except when bathing and can wear the shrinker with 2 layers to above his knee or with 1 layer above the knee in 3 to 4 layers more distally to apply more pressure for distal limb shaping.  Per the physical therapist he is to wear the liner during waking hours with initially starting 2 to 3 hours aware and then adding half an hour of wear each day until the patient gets used to wearing the liner.   Assessment and Plan: Sepsis due to cellulitis and gangrene of the left foot > status post left BKA Initially treated with IV vancomycin cefepime and  Flagyl - ultimately underwent left BKA 12/10/2022 - has received a left limb prosthesis - Physical Therapy continues to work with the patient on use of prosthesis and ambulation   Housing instability Patient informed Dr. Benjamine Mola that he has established long-term housing at [name] and should be able to move in 06/11/2023 - plan is for d/c 06/11/23. Remains medically stable at this time   Anterior maxillary abscess -multiple periapical abscesses of teeth Status post I&D of anterior maxillary abscess and surgical extraction of numerous teeth as well as simple extraction of many other teeth and alveoloplasty x 4 quadrants 03/25/2023 - continue oral hygiene and benzocaine mucosal gel as needed   Unilateral hearing loss in setting of cerumen impaction Otoscope exam revealed severely cerumen impacted ear canal - case was discussed with ENT who suggested an outpatient evaluation with continued flushing of the ears and use of Debrox, seems stable   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 Well compensated presently - TTE September 2023 noted EF <20% with global hypokinesis - continue usual Imdur Lasix and Coreg - patient has been offered outpatient Cardiology evaluation on numerous occasions but has a 100% no-show rate for office visits - he was evaluated to some extent at [facility] in 2020 - noncompliance has made him ineligible for advanced heart failure therapies - no difficulty with signif volume overload during this hospitalization thus far   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 Intention at that time was for  the patient to remain on lifelong anticoagulation - Eliquis was discontinued 12/09/2022 apparently in anticipation of surgery and has not been resumed since - given his long history of noncompliance and his current bilateral lower extremity amputation status I feel the risk of ongoing anticoagulation outweighs the benefit at present   HTN Blood  pressure reasonably well-controlled at this time   COPD Stable   Constipation Continue bowel regimen   Chronic pain appears comfortable on serial exam -with patient now well out from amputation will attempt to wean dose of chronic narcotics   Schizoaffective disorder/bipolar disorder Continue Lexapro and Abilify   Obesity - Body mass index is 29.3 kg/m.       Subjective: Without complaints at this time. Looking forward to discharging tomorrow  Physical Exam: Vitals:   06/09/23 0924 06/09/23 2115 06/10/23 0842 06/10/23 1249  BP: (!) 151/97 (!) 152/97 (!) 152/91 133/83  Pulse: 69 69 67 72  Resp: 18 17 18 17   Temp: 98.4 F (36.9 C) 98.2 F (36.8 C) 98.4 F (36.9 C) 98.2 F (36.8 C)  TempSrc: Oral Oral Oral Oral  SpO2: 97% 96% 100% 99%  Weight:      Height:       General exam: Awake, laying in bed, in nad Respiratory system: Normal respiratory effort, no wheezing Cardiovascular system: regular rate, s1, s2 Gastrointestinal system: Soft, nondistended, positive BS Central nervous system: CN2-12 grossly intact, strength intact Extremities: Perfused, no clubbing, BLE amputation Skin: Normal skin turgor, no notable skin lesions seen Psychiatry: Mood normal // no visual hallucinations   Data Reviewed:  There are no new results to review at this time.  Family Communication: Pt in room, family not at bedside  Disposition: Status is: Inpatient Remains inpatient appropriate because: severity of illness   Planned Discharge Destination: Home     Author: Rickey Barbara, MD 06/10/2023 1:57 PM  For on call review [email or web address].

## 2023-06-11 DIAGNOSIS — Z89611 Acquired absence of right leg above knee: Secondary | ICD-10-CM

## 2023-06-11 NOTE — Plan of Care (Signed)
  Problem: Pain Managment: Goal: General experience of comfort will improve Outcome: Adequate for Discharge   Problem: Safety: Goal: Ability to remain free from injury will improve Outcome: Adequate for Discharge   Problem: Skin Integrity: Goal: Risk for impaired skin integrity will decrease Outcome: Adequate for Discharge   Problem: Education: Goal: Knowledge of General Education information will improve Description: Including pain rating scale, medication(s)/side effects and non-pharmacologic comfort measures Outcome: Adequate for Discharge

## 2023-06-11 NOTE — TOC Progression Note (Addendum)
Transition of Care [facility]) - Progression Note    Patient Details  Name: [name] MRN: [id number] Date of Birth: 09/02/1966  Transition of Care [facility] CM/SW Contact  [name], [name] Phone Number: 06/11/2023, 10:33 AM  Clinical Narrative:    Per [facility] staff, a new ACTT team referral has been made to [facility].  CSW spoke with Diplomatic Services operational officer at [facility] to get patient's appointment changed - his new appointment time is 07/30/23 at 12:30pm.  CSW spoke with Toni Amend at [facility] who states the application was received and is under review. Toni Amend will update CSW with new information as it becomes available.  CSW spoke with Genta of [name] who requested clinicals be faxed for review. Genta states a staff member from [facility] will come visit patient tomorrow at the hospital to complete his ACTT assessment. Genta states she is hopeful to have patient's services initiated early next week.  Expected Discharge Plan: Home/Self Care Barriers to Discharge: Homeless with medical needs  Expected Discharge Plan and Services   Discharge Planning Services: CM Consult Post Acute Care Choice: Home Health Living arrangements for the past 2 months: Homeless                                       Social Determinants of Health (SDOH) Interventions SDOH Screenings   Food Insecurity: Food Insecurity Present (11/24/2022)  Housing: Medium Risk (12/07/2022)  Transportation Needs: Unmet Transportation Needs (12/07/2022)  Utilities: Not At Risk (12/07/2022)  Alcohol Screen: Medium Risk (06/24/2019)  Depression (PHQ2-9): Medium Risk (02/10/2021)  Financial Resource Strain: High Risk (07/24/2021)   Received from [facility]  Social Connections: Unknown (04/05/2022)   Received from [facility]  Stress: Stress Concern Present (07/24/2021)   Received from [facility]  Tobacco Use: High Risk (03/25/2023)    Readmission Risk Interventions    12/09/2022    9:02 AM 12/01/2022     3:56 PM 08/18/2022    9:00 AM  Readmission Risk Prevention Plan  Transportation Screening Complete Complete Complete  Medication Review Oceanographer) Complete Complete Complete  PCP or Specialist appointment within 3-5 days of discharge Complete Complete Complete  HRI or Home Care Consult Complete Complete Complete  SW Recovery Care/Counseling Consult Complete Complete Complete  Palliative Care Screening Not Applicable Not Applicable Not Applicable  Skilled Nursing Facility Not Applicable Not Applicable Not Applicable  SNF Comments  n/a

## 2023-06-11 NOTE — Progress Notes (Signed)
Progress Note   Patient: [name] QMV:[id number] DOB: 01-02-1966 DOA: 12/07/2022     186 DOS: the patient was seen and examined on 06/11/2023   Brief hospital course: [name] is a 57 y.o. male with a history of heart failure, CAD, right BKA, COPD, hyperlipidemia, hyperlipidemia, polyneuropathy, schizoaffective/bipolar disorder, was admitted to the hospital with left lower extremity cellulitis and gangrene.   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      During hospitalization, patient was also noted to have maxillary abscess and periapical abscess and underwent oral surgery with I&D and of anterior maxillary abscess and surgical extraction of multiple teeth in alveoloplasty. Currently stable.   Hospital stay prolonged due to difficulty in placement due to homelessness status status post left BKA and right AKA.   [facility],  have offered to provide a prosthesis for LLE (the BKA)   The patient scheduled an appointment Monday (June 3rd, 2024) at noon to meet up with patient and the Prosthetist to cast the L residual limb and make a plan and patient to receive a prosthesis for his left leg pro bono.  **Patient met with the Prosthetist for his Left Leg who brought initial test socket and will bring another test socket this week (05/18/23)and the patient is to wear the shrinker at all times except when bathing and can wear the shrinker with 2 layers to above his knee or with 1 layer above the knee in 3 to 4 layers more distally to apply more pressure for distal limb shaping.  Per the physical therapist he is to wear the liner during waking hours with initially starting 2 to 3 hours aware and then adding half an hour of wear each day until the patient gets used to wearing the liner.   Assessment and Plan: Sepsis due to cellulitis and gangrene of the left foot > status post left BKA Initially treated with IV vancomycin cefepime and  Flagyl - ultimately underwent left BKA 12/10/2022 - has received a left limb prosthesis - Physical Therapy continues to work with the patient on use of prosthesis and ambulation. Pt reported being able to get up on his own yesterday with walker   Housing instability Patient informed Dr. Benjamine Mola that he has established long-term housing at [name] and should be able to move in 06/11/2023 - plan is for d/c 06/11/23. Remains medically stable at this time   Anterior maxillary abscess -multiple periapical abscesses of teeth Status post I&D of anterior maxillary abscess and surgical extraction of numerous teeth as well as simple extraction of many other teeth and alveoloplasty x 4 quadrants 03/25/2023 - continue oral hygiene and benzocaine mucosal gel as needed   Unilateral hearing loss in setting of cerumen impaction Otoscope exam revealed severely cerumen impacted ear canal - case was discussed with ENT who suggested an outpatient evaluation with continued flushing of the ears and use of Debrox, seems stable   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 Well compensated presently - TTE September 2023 noted EF <20% with global hypokinesis - continue usual Imdur Lasix and Coreg - patient has been offered outpatient Cardiology evaluation on numerous occasions but has a 100% no-show rate for office visits - he was evaluated to some extent at [facility] in 2020 - noncompliance has made him ineligible for advanced heart failure therapies - no difficulty with signif volume overload during this hospitalization thus far   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 Intention at that time was for the patient to remain on lifelong anticoagulation - Eliquis was discontinued 12/09/2022 apparently in anticipation of surgery and has not been resumed since - given his long history of noncompliance and his current bilateral lower extremity amputation status I feel the risk of ongoing  anticoagulation outweighs the benefit at present   HTN Blood pressure reasonably well-controlled at this time   COPD Stable   Constipation Continue bowel regimen   Chronic pain appears comfortable on serial exam -with patient now well out from amputation will attempt to wean dose of chronic narcotics   Schizoaffective disorder/bipolar disorder Continue Lexapro and Abilify   Obesity - Body mass index is 29.3 kg/m.       Subjective: Feeling happy that he is able to get up on his own with walker when working with PT yesterday  Physical Exam: Vitals:   06/10/23 1249 06/10/23 1935 06/11/23 0432 06/11/23 0915  BP: 133/83 (!) 159/99 122/71 (!) 147/90  Pulse: 72 81 65 73  Resp: 17 18 20 19   Temp: 98.2 F (36.8 C) 97.7 F (36.5 C) 98.3 F (36.8 C) 98 F (36.7 C)  TempSrc: Oral Oral Oral Oral  SpO2: 99% 100% 100% 94%  Weight:      Height:       General exam: Conversant, in no acute distress Respiratory system: normal chest rise, clear, no audible wheezing Cardiovascular system: regular rhythm, s1-s2 Gastrointestinal system: Nondistended, nontender, pos BS Central nervous system: No seizures, no tremors Extremities: No cyanosis, no joint deformities Skin: No rashes, no pallor Psychiatry: Affect normal // no auditory hallucinations   Data Reviewed:  There are no new results to review at this time.  Family Communication: Pt in room, family not at bedside  Disposition: Status is: Inpatient Remains inpatient appropriate because: severity of illness   Planned Discharge Destination: Home     Author: Rickey Barbara, MD 06/11/2023 4:58 PM  For on call review [email or web address].

## 2023-06-11 NOTE — Plan of Care (Signed)
  Problem: Pain Managment: Goal: General experience of comfort will improve Outcome: Progressing   Problem: Safety: Goal: Ability to remain free from injury will improve Outcome: Progressing   

## 2023-06-12 DIAGNOSIS — Z89611 Acquired absence of right leg above knee: Secondary | ICD-10-CM | POA: Diagnosis not present

## 2023-06-12 DIAGNOSIS — L03116 Cellulitis of left lower limb: Secondary | ICD-10-CM | POA: Diagnosis not present

## 2023-06-12 DIAGNOSIS — I96 Gangrene, not elsewhere classified: Secondary | ICD-10-CM | POA: Diagnosis not present

## 2023-06-12 NOTE — Progress Notes (Signed)
Occupational Therapy Treatment Patient Details Name: [name] MRN: [id number] DOB: 03/13/1966 Today's Date: 06/12/2023   History of present illness Pt is a 57 y/o M who presented 12/17/22 with L ankle cellulitis and underwent L BKA on 1/10. Oral abscesses noted, underwent oral surgery Wed, 4/24; Initiated fabrication of prosthesis for LLE on 6/3; recieved LLE prosthesis on 6/28; PMH includes asthma, bioplar 1, depression, schizoaffective disorder, CVA, chronic CHF, homelessness, HTN, DM2, cocaine and cannabis use, and R knee disarticulation   OT comments  Pt continuing to have challenges independently donning L prosthesis, initially was not able to fit prosthesis until removing white liner. OT reinforced pt comply with shrinker wear to allow for better fit. Pt uses stomping to don prosthesis but discussed the use of trapeze bar at home to assist with standing to lock prosthesis, also provided handout on leg lifter with foot loop that may allow for more UE assist to don prosthetic. Standing and initiating steps with prosthesis remain a challenge. OT to continue to problem solve with patient to allow for best DC potential. DC plans remain appropriate.    Recommendations for follow up therapy are one component of a multi-disciplinary discharge planning process, led by the attending physician.  Recommendations may be updated based on patient status, additional functional criteria and insurance authorization.    Assistance Recommended at Discharge Intermittent Supervision/Assistance  Patient can return home with the following  A little help with walking and/or transfers   Equipment Recommendations  Other (comment) (pending further assessment)    Recommendations for Other Services      Precautions / Restrictions Precautions Precautions: None Precaution Comments: R knee disrticulation, L BKA; Fall risk is present, but minimal Required Braces or Orthoses: Other Brace Other Brace: Highly  recommend Breanna sleep in a liner or shrinker to keep residual limb from swelling Restrictions Weight Bearing Restrictions: No RLE Weight Bearing: Weight bearing as tolerated LLE Weight Bearing: Weight bearing as tolerated       Mobility Bed Mobility               General bed mobility comments: OOB in wheelchair    Transfers Overall transfer level: Needs assistance Equipment used: Rolling walker (2 wheels) Transfers: Sit to/from Stand Sit to Stand: From elevated surface, Min assist           General transfer comment: STS x2 from elevated bed, pt needs OT support to hold RW in place. Pt not comfortable with pushing off bed to stand     Balance Overall balance assessment: Needs assistance Sitting-balance support: Feet unsupported, No upper extremity supported Sitting balance-Leahy Scale: Normal       Standing balance-Leahy Scale: Poor                             ADL either performed or assessed with clinical judgement   ADL Overall ADL's : Needs assistance/impaired                     Lower Body Dressing: Minimal assistance;Sitting/lateral leans Lower Body Dressing Details (indicate cue type and reason): Mod I to don prostethic sleeve using bed to provide support. Min A to don prostethic via repetitive stomping EOB             Functional mobility during ADLs: Rolling walker (2 wheels);Moderate assistance (Lateral steps with prosthetic + RW) General ADL Comments: Pt expressed fear of lateral steps, he is unsure of home setup  so unsure if pt will be able to use stomping technique to don prosthesis. Discussed use of trapeze bar for STS, handout provided for leg lifter with LE support to potentially use arm strength to assist with locking L prosthesis.    Extremity/Trunk Assessment              Vision       Perception     Praxis      Cognition Arousal/Alertness: Awake/alert Behavior During Therapy: WFL for tasks  assessed/performed Overall Cognitive Status: Within Functional Limits for tasks assessed                                          Exercises      Shoulder Instructions       General Comments White liner removed from prosthetic due to difficulty fitting, reinforced pt wear shrinker    Pertinent Vitals/ Pain       Pain Assessment Pain Assessment: Faces Faces Pain Scale: Hurts little more Pain Location: L distal residual limb with weight bearing in prosthesis Pain Descriptors / Indicators: Grimacing Pain Intervention(s): Monitored during session, Repositioned, Limited activity within patient's tolerance  Home Living                                          Prior Functioning/Environment              Frequency  Min 2X/week        Progress Toward Goals  OT Goals(current goals can now be found in the care plan section)  Progress towards OT goals: Progressing toward goals  Acute Rehab OT Goals OT Goal Formulation: With patient Time For Goal Achievement: 06/15/23 Potential to Achieve Goals: Good  Plan Discharge plan remains appropriate;Frequency needs to be updated       AM-PAC OT "6 Clicks" Daily Activity     Outcome Measure   Help from another person eating meals?: None Help from another person taking care of personal grooming?: None Help from another person toileting, which includes using toliet, bedpan, or urinal?: None Help from another person bathing (including washing, rinsing, drying)?: None Help from another person to put on and taking off regular upper body clothing?: None Help from another person to put on and taking off regular lower body clothing?: None 6 Click Score: 24    End of Session Equipment Utilized During Treatment: Gait belt;Rolling walker (2 wheels)  OT Visit Diagnosis: Other abnormalities of gait and mobility (R26.89);Unsteadiness on feet (R26.81) Pain - Right/Left: Left Pain - part of body: Leg    Activity Tolerance Patient tolerated treatment well   Patient Left in chair;Other (comment) (in w/c)   Nurse Communication Mobility status        Time: 1610-9604 OT Time Calculation (min): 32 min  Charges: OT General Charges $OT Visit: 1 Visit OT Treatments $Therapeutic Activity: 23-37 mins  06/12/2023  AB, OTR/L  Acute Rehabilitation Services  Office: [phone]   Tristan Schroeder 06/12/2023, 5:07 PM

## 2023-06-12 NOTE — Progress Notes (Signed)
Progress Note   Patient: [name] ZOX:[id number] DOB: [birth year] DOA: 12/07/2022     187 DOS: the patient was seen and examined on 06/12/2023   Brief hospital course: [name] is a 57 y.o. male with a history of heart failure, CAD, right BKA, COPD, hyperlipidemia, hyperlipidemia, polyneuropathy, schizoaffective/bipolar disorder, was admitted to the hospital with left lower extremity cellulitis and gangrene.   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      During hospitalization, patient was also noted to have maxillary abscess and periapical abscess and underwent oral surgery with I&D and of anterior maxillary abscess and surgical extraction of multiple teeth in alveoloplasty. Currently stable.   Hospital stay prolonged due to difficulty in placement due to homelessness status status post left BKA and right AKA.   [facility],  have offered to provide a prosthesis for LLE (the BKA)   The patient scheduled an appointment Monday (June 3rd, 2024) at noon to meet up with patient and the Prosthetist to cast the L residual limb and make a plan and patient to receive a prosthesis for his left leg pro bono.  **Patient met with the Prosthetist for his Left Leg who brought initial test socket and will bring another test socket this week (05/18/23)and the patient is to wear the shrinker at all times except when bathing and can wear the shrinker with 2 layers to above his knee or with 1 layer above the knee in 3 to 4 layers more distally to apply more pressure for distal limb shaping.  Per the physical therapist he is to wear the liner during waking hours with initially starting 2 to 3 hours aware and then adding half an hour of wear each day until the patient gets used to wearing the liner.   Assessment and Plan: Sepsis due to cellulitis and gangrene of the left foot > status post left BKA Initially treated with IV vancomycin cefepime and  Flagyl - ultimately underwent left BKA 12/10/2022 - has received a left limb prosthesis - Physical Therapy continues to work with the patient on use of prosthesis and ambulation. Continues to work with PT   Housing instability Patient informed Dr. Benjamine [name] that he has established long-term housing at [name] and should be able to move in 06/11/2023 - plan is for d/c 06/11/23. Remains medically stable at this time   Anterior maxillary abscess -multiple periapical abscesses of teeth Status post I&D of anterior maxillary abscess and surgical extraction of numerous teeth as well as simple extraction of many other teeth and alveoloplasty x 4 quadrants 03/25/2023 - continue oral hygiene and benzocaine mucosal gel as needed   Unilateral hearing loss in setting of cerumen impaction Otoscope exam revealed severely cerumen impacted ear canal - case was discussed with ENT who suggested an outpatient evaluation with continued flushing of the ears and use of Debrox, seems stable   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 Well compensated presently - TTE September 2023 noted EF <20% with global hypokinesis - continue usual Imdur Lasix and Coreg - patient has been offered outpatient Cardiology evaluation on numerous occasions but has a 100% no-show rate for office visits - he was evaluated to some extent at [facility] in 2020 - noncompliance has made him ineligible for advanced heart failure therapies - no difficulty with signif volume overload during this hospitalization thus far   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 Intention  at that time was for the patient to remain on lifelong anticoagulation - Eliquis was discontinued 12/09/2022 apparently in anticipation of surgery and has not been resumed since - given his long history of noncompliance and his current bilateral lower extremity amputation status I feel the risk of ongoing anticoagulation outweighs the benefit  at present   HTN Blood pressure reasonably well-controlled at this time   COPD Stable   Constipation Continue bowel regimen   Chronic pain appears comfortable on serial exam -with patient now well out from amputation will attempt to wean dose of chronic narcotics   Schizoaffective disorder/bipolar disorder Continue Lexapro and Abilify   Obesity - Body mass index is 29.3 kg/m.       Subjective: In good spirits this AM. No complaints  Physical Exam: Vitals:   06/11/23 0432 06/11/23 0915 06/11/23 2121 06/12/23 1018  BP: 122/71 (!) 147/90 (!) 123/98 (!) 148/76  Pulse: 65 73 71   Resp: 20 19 16    Temp: 98.3 F (36.8 C) 98 F (36.7 C) 98.8 F (37.1 C)   TempSrc: Oral Oral Oral   SpO2: 100% 94% 98%   Weight:      Height:       General exam: Awake, laying in bed, in nad Respiratory system: Normal respiratory effort, no wheezing Cardiovascular system: regular rate, s1, s2 Gastrointestinal system: Soft, nondistended, positive BS Central nervous system: CN2-12 grossly intact, strength intact Extremities: Perfused, no clubbing Skin: Normal skin turgor, no notable skin lesions seen Psychiatry: Mood normal // no visual hallucinations   Data Reviewed:  There are no new results to review at this time.  Family Communication: Pt in room, family not at bedside  Disposition: Status is: Inpatient Remains inpatient appropriate because: severity of illness   Planned Discharge Destination: Home     Author: Rickey Barbara, MD 06/12/2023 4:00 PM  For on call review [email or web address].

## 2023-06-12 NOTE — Plan of Care (Signed)
  Problem: Pain Managment: Goal: General experience of comfort will improve 06/12/2023 0536 by Debbrah Alar, RN Outcome: Not Progressing 06/12/2023 0525 by Debbrah Alar, RN Outcome: Progressing   Problem: Safety: Goal: Ability to remain free from injury will improve 06/12/2023 0536 by Debbrah Alar, RN Outcome: Not Progressing 06/12/2023 0525 by Velia Meyer D, RN Outcome: Progressing   Problem: Skin Integrity: Goal: Risk for impaired skin integrity will decrease 06/12/2023 0536 by Debbrah Alar, RN Outcome: Not Progressing 06/12/2023 0525 by Debbrah Alar, RN Outcome: Progressing   Problem: Education: Goal: Knowledge of General Education information will improve Description: Including pain rating scale, medication(s)/side effects and non-pharmacologic comfort measures 06/12/2023 0536 by Debbrah Alar, RN Outcome: Not Progressing 06/12/2023 0525 by Debbrah Alar, RN Outcome: Progressing   Problem: Health Behavior/Discharge Planning: Goal: Ability to manage health-related needs will improve 06/12/2023 0536 by Debbrah Alar, RN Outcome: Not Progressing 06/12/2023 0525 by Debbrah Alar, RN Outcome: Progressing

## 2023-06-12 NOTE — TOC Progression Note (Signed)
Transition of Care [facility]) - Progression Note    Patient Details  Name: [name] MRN: [id number] Date of Birth: [birth year]  Transition of Care [facility] CM/SW Contact  [name], [name] Phone Number: 06/12/2023, 10:37 AM  Clinical Narrative:    CSW spoke with Genta of [name] who states she is coming to the hospital at 11am to visit patient.  CSW spoke with patient at bedside to inform him of scheduled visit.   Expected Discharge Plan: Home/Self Care Barriers to Discharge: Homeless with medical needs  Expected Discharge Plan and Services   Discharge Planning Services: CM Consult Post Acute Care Choice: Home Health Living arrangements for the past 2 months: Homeless                                       Social Determinants of Health (SDOH) Interventions SDOH Screenings   Food Insecurity: Food Insecurity Present (11/24/2022)  Housing: Medium Risk (12/07/2022)  Transportation Needs: Unmet Transportation Needs (12/07/2022)  Utilities: Not At Risk (12/07/2022)  Alcohol Screen: Medium Risk (06/24/2019)  Depression (PHQ2-9): Medium Risk (02/10/2021)  Financial Resource Strain: High Risk (07/24/2021)   Received from [facility]  Social Connections: Unknown (04/05/2022)   Received from [facility]  Stress: Stress Concern Present (07/24/2021)   Received from [facility]  Tobacco Use: High Risk (03/25/2023)    Readmission Risk Interventions    12/09/2022    9:02 AM 12/01/2022    3:56 PM 08/18/2022    9:00 AM  Readmission Risk Prevention Plan  Transportation Screening Complete Complete Complete  Medication Review Oceanographer) Complete Complete Complete  PCP or Specialist appointment within 3-5 days of discharge Complete Complete Complete  HRI or Home Care Consult Complete Complete Complete  SW Recovery Care/Counseling Consult Complete Complete Complete  Palliative Care Screening Not Applicable Not Applicable Not Applicable  Skilled Nursing Facility Not  Applicable Not Applicable Not Applicable  SNF Comments  n/a

## 2023-06-13 DIAGNOSIS — I96 Gangrene, not elsewhere classified: Secondary | ICD-10-CM | POA: Diagnosis not present

## 2023-06-13 DIAGNOSIS — L03116 Cellulitis of left lower limb: Secondary | ICD-10-CM | POA: Diagnosis not present

## 2023-06-13 LAB — COMPREHENSIVE METABOLIC PANEL
ALT: 19 U/L (ref 0–44)
AST: 19 U/L (ref 15–41)
Albumin: 3.8 g/dL (ref 3.5–5.0)
Alkaline Phosphatase: 72 U/L (ref 38–126)
Anion gap: 13 (ref 5–15)
BUN: 16 mg/dL (ref 6–20)
CO2: 22 mmol/L (ref 22–32)
Calcium: 9 mg/dL (ref 8.9–10.3)
Chloride: 104 mmol/L (ref 98–111)
Creatinine, Ser: 1.17 mg/dL (ref 0.61–1.24)
GFR, Estimated: >60 mL/min (ref >60)
Glucose, Bld: 132 mg/dL — ABNORMAL HIGH (ref 70–99)
Potassium: 3.7 mmol/L (ref 3.5–5.1)
Sodium: 139 mmol/L (ref 135–145)
Total Bilirubin: 0.3 mg/dL (ref 0.3–1.2)
Total Protein: 7.4 g/dL (ref 6.5–8.1)

## 2023-06-13 LAB — CBC
HCT: 41 % (ref 39.0–52.0)
Hemoglobin: 13.9 g/dL (ref 13.0–17.0)
MCH: 30.7 pg (ref 26.0–34.0)
MCHC: 33.9 g/dL (ref 30.0–36.0)
MCV: 90.5 fL (ref 80.0–100.0)
Platelets: 236 10*3/uL (ref 150–400)
RBC: 4.53 MIL/uL (ref 4.22–5.81)
RDW: 14.5 % (ref 11.5–15.5)
WBC: 6.6 10*3/uL (ref 4.0–10.5)
nRBC: 0 % (ref 0.0–0.2)

## 2023-06-13 NOTE — Progress Notes (Signed)
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 Oral abscesses noted, underwent oral surgery Wed, 4/24; Initiated fabrication of prosthesis for LLE on 6/3; recieved LLE prosthesis on 6/28; PMH includes asthma, bioplar 1, depression, schizoaffective disorder, CVA, chronic CHF, homelessness, HTN, DM2, cocaine and cannabis use, and R knee disarticulation   OT comments  Attempted to don prosthesis for pt to use BUEs to leverage his strength to don prosthesis via trialing various pulling systems/devices. Pt not able to use methods for donning prosthesis, was able to postioning LE against a wall at an angle position to get prosthesis to click then repeatedly stomped to fully lock. Discussed with PT via epic chat potential resources for pt to use, rec HH OT once patient gets established to identify ways to modify home environment to allow for better ways to maximize independence.     Recommendations for follow up therapy are one component of a multi-disciplinary discharge planning process, led by the attending physician.  Recommendations may be updated based on patient status, additional functional criteria and insurance authorization.    Assistance Recommended at Discharge Intermittent Supervision/Assistance  Patient can return home with the following  A little help with walking and/or transfers   Equipment Recommendations  Other (comment) (overhead trapeze or metal grid landing)    Recommendations for Other Services      Precautions / Restrictions Precautions Precautions: None Precaution Comments: R knee disrticulation, L BKA; Fall risk is present, but minimal Required Braces or Orthoses: Other Brace Other Brace: Highly recommend [name] in a liner or shrinker to keep residual limb from  swelling Restrictions Weight Bearing Restrictions: No RLE Weight Bearing: Weight bearing as tolerated LLE Weight Bearing: Weight bearing as tolerated       Mobility Bed Mobility               General bed mobility comments: OOB in wheelchair    Transfers                         Balance                                           ADL either performed or assessed with clinical judgement   ADL                         Lower Body Dressing Details (indicate cue type and reason): Mod I to don prostethic sleeve using bed to provide support. Min A to don prostethic via repetitive stomping               General ADL Comments: trialed use of different devices  for pt to use UE strength to assist with independently donning prosthesis but it was to no avail. Pt only way of currently donning prosthesis is via repetive stomping. This time positoned LE at an able against the wall to which pt was able to apply pressure for first click then repeatedly stomp to full lock prosthesis    Extremity/Trunk Assessment              Vision       Perception     Praxis  Cognition Arousal/Alertness: Awake/alert   Overall Cognitive Status: Within Functional Limits for tasks assessed                                          Exercises      Shoulder Instructions       General Comments White liner removed from prosthetic due to difficulty fitting, reinforced pt wear shrinker    Pertinent Vitals/ Pain       Pain Assessment Pain Assessment: No/denies pain  Home Living                                          Prior Functioning/Environment              Frequency  Min 2X/week        Progress Toward Goals  OT Goals(current goals can now be found in the care plan section)  Progress towards OT goals: Progressing toward goals  Acute Rehab OT Goals OT Goal Formulation: With patient Time For  Goal Achievement: 06/15/23 Potential to Achieve Goals: Good  Plan Discharge plan remains appropriate;Frequency needs to be updated       AM-PAC OT "6 Clicks" Daily Activity     Outcome Measure   Help from another person eating meals?: None Help from another person taking care of personal grooming?: None Help from another person toileting, which includes using toliet, bedpan, or urinal?: None Help from another person bathing (including washing, rinsing, drying)?: None Help from another person to put on and taking off regular upper body clothing?: None Help from another person to put on and taking off regular lower body clothing?: None 6 Click Score: 24    End of Session Equipment Utilized During Treatment: Other (comment) (w/c)  OT Visit Diagnosis: Other abnormalities of gait and mobility (R26.89);Unsteadiness on feet (R26.81) Pain - Right/Left: Left Pain - part of body: Leg   Activity Tolerance Patient tolerated treatment well   Patient Left in chair;Other (comment) (in w/c)   Nurse Communication Mobility status        Time: 0865-7846 OT Time Calculation (min): 15 min  Charges: OT General Charges $OT Visit: 1 Visit OT Treatments $Therapeutic Activity: 8-22 mins  06/13/2023  AB, OTR/L  Acute Rehabilitation Services  Office: [phone]   Tristan Schroeder 06/13/2023, 5:58 PM

## 2023-06-13 NOTE — Plan of Care (Signed)
  Problem: Pain Managment: Goal: General experience of comfort will improve Outcome: Progressing   Problem: Safety: Goal: Ability to remain free from injury will improve Outcome: Progressing   Problem: Skin Integrity: Goal: Risk for impaired skin integrity will decrease Outcome: Progressing   Problem: Education: Goal: Knowledge of General Education information will improve Description: Including pain rating scale, medication(s)/side effects and non-pharmacologic comfort measures Outcome: Progressing   Problem: Health Behavior/Discharge Planning: Goal: Ability to manage health-related needs will improve Outcome: Progressing   Problem: Clinical Measurements: Goal: Ability to maintain clinical measurements within normal limits will improve Outcome: Progressing Goal: Will remain free from infection Outcome: Progressing Goal: Diagnostic test results will improve Outcome: Progressing Goal: Respiratory complications will improve Outcome: Progressing Goal: Cardiovascular complication will be avoided Outcome: Progressing

## 2023-06-13 NOTE — Plan of Care (Signed)
  Problem: Pain Managment: Goal: General experience of comfort will improve Outcome: Progressing   Problem: Safety: Goal: Ability to remain free from injury will improve Outcome: Progressing   Problem: Skin Integrity: Goal: Risk for impaired skin integrity will decrease Outcome: Progressing   

## 2023-06-13 NOTE — Progress Notes (Signed)
Progress Note   Patient: [name] OZH:[id number] DOB: July 07, 1966 DOA: 12/07/2022     188 DOS: the patient was seen and examined on 06/13/2023   Brief hospital course: [name] is a 57 y.o. male with a history of heart failure, CAD, right BKA, COPD, hyperlipidemia, hyperlipidemia, polyneuropathy, schizoaffective/bipolar disorder, was admitted to the hospital with left lower extremity cellulitis and gangrene.   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      During hospitalization, patient was also noted to have maxillary abscess and periapical abscess and underwent oral surgery with I&D and of anterior maxillary abscess and surgical extraction of multiple teeth in alveoloplasty. Currently stable.   Hospital stay prolonged due to difficulty in placement due to homelessness status status post left BKA and right AKA.   [facility],  have offered to provide a prosthesis for LLE (the BKA)   The patient scheduled an appointment Monday (June 3rd, 2024) at noon to meet up with patient and the Prosthetist to cast the L residual limb and make a plan and patient to receive a prosthesis for his left leg pro bono.  **Patient met with the Prosthetist for his Left Leg who brought initial test socket and will bring another test socket this week (05/18/23)and the patient is to wear the shrinker at all times except when bathing and can wear the shrinker with 2 layers to above his knee or with 1 layer above the knee in 3 to 4 layers more distally to apply more pressure for distal limb shaping.  Per the physical therapist he is to wear the liner during waking hours with initially starting 2 to 3 hours aware and then adding half an hour of wear each day until the patient gets used to wearing the liner.   Assessment and Plan: Sepsis due to cellulitis and gangrene of the left foot > status post left BKA Initially treated with IV vancomycin cefepime and  Flagyl - ultimately underwent left BKA 12/10/2022 - has received a left limb prosthesis - Physical Therapy continues to work with the patient on use of prosthesis and ambulation. Continues to work with PT   Housing instability Patient informed Dr. Benjamine Mola that he has established long-term housing at [name] and should be able to move in 06/11/2023 - plan is for d/c 06/11/23.Currently medically stable at this time   Anterior maxillary abscess -multiple periapical abscesses of teeth Status post I&D of anterior maxillary abscess and surgical extraction of numerous teeth as well as simple extraction of many other teeth and alveoloplasty x 4 quadrants 03/25/2023 - continue oral hygiene and benzocaine mucosal gel as needed   Unilateral hearing loss in setting of cerumen impaction Otoscope exam revealed severely cerumen impacted ear canal - case was discussed with ENT who suggested an outpatient evaluation with continued flushing of the ears and use of Debrox, seems stable   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 Well compensated presently - TTE September 2023 noted EF <20% with global hypokinesis - continue usual Imdur Lasix and Coreg - patient has been offered outpatient Cardiology evaluation on numerous occasions but has a 100% no-show rate for office visits - he was evaluated to some extent at [facility] in 2020 - noncompliance has made him ineligible for advanced heart failure therapies - no difficulty with signif volume overload during this hospitalization thus far   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 Intention at  that time was for the patient to remain on lifelong anticoagulation - Eliquis was discontinued 12/09/2022 apparently in anticipation of surgery and has not been resumed since - given his long history of noncompliance and his current bilateral lower extremity amputation status I feel the risk of ongoing anticoagulation outweighs the benefit  at present   HTN Blood pressure reasonably well-controlled at this time   COPD Stable   Constipation Continue bowel regimen   Chronic pain appears comfortable on serial exam -with patient now well out from amputation will attempt to wean dose of chronic narcotics   Schizoaffective disorder/bipolar disorder Continue Lexapro and Abilify   Obesity - Body mass index is 29.3 kg/m.       Subjective: Without complaints this AM  Physical Exam: Vitals:   06/12/23 1018 06/12/23 1730 06/12/23 2001 06/13/23 0917  BP: (!) 148/76 (!) 169/100 (!) 156/98 118/73  Pulse:  71 74 70  Resp:  16 17 17   Temp:  98.3 F (36.8 C) 98 F (36.7 C) 98.2 F (36.8 C)  TempSrc:  Oral Oral Oral  SpO2:  99% 95% 100%  Weight:      Height:       General exam: Conversant, in no acute distress Respiratory system: normal chest rise, clear, no audible wheezing Cardiovascular system: regular rhythm, s1-s2 Gastrointestinal system: Nondistended, nontender, pos BS Central nervous system: No seizures, no tremors Extremities: No cyanosis, no joint deformities Skin: No rashes, no pallor Psychiatry: Affect normal // no auditory hallucinations   Data Reviewed:  There are no new results to review at this time.  Family Communication: Pt in room, family not at bedside  Disposition: Status is: Inpatient Remains inpatient appropriate because: severity of illness   Planned Discharge Destination: Home     Author: Rickey Barbara, MD 06/13/2023 4:39 PM  For on call review [email or web address].

## 2023-06-14 DIAGNOSIS — I96 Gangrene, not elsewhere classified: Secondary | ICD-10-CM | POA: Diagnosis not present

## 2023-06-14 DIAGNOSIS — Z89611 Acquired absence of right leg above knee: Secondary | ICD-10-CM | POA: Diagnosis not present

## 2023-06-14 DIAGNOSIS — L03116 Cellulitis of left lower limb: Secondary | ICD-10-CM | POA: Diagnosis not present

## 2023-06-14 NOTE — Progress Notes (Signed)
Progress Note   Patient: [name] ZOX:[id number] DOB: 03/21/1966 DOA: 12/07/2022     189 DOS: the patient was seen and examined on 06/14/2023   Brief hospital course: Demorion Ondo is a 57 y.o. male with a history of heart failure, CAD, right BKA, COPD, hyperlipidemia, hyperlipidemia, polyneuropathy, schizoaffective/bipolar disorder, was admitted to the hospital with left lower extremity cellulitis and gangrene.   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      During hospitalization, patient was also noted to have maxillary abscess and periapical abscess and underwent oral surgery with I&D and of anterior maxillary abscess and surgical extraction of multiple teeth in alveoloplasty. Currently stable.   Hospital stay prolonged due to difficulty in placement due to homelessness status status post left BKA and right AKA.   [facility],  have offered to provide a prosthesis for LLE (the BKA)   The patient scheduled an appointment Monday (June 3rd, 2024) at noon to meet up with patient and the Prosthetist to cast the L residual limb and make a plan and patient to receive a prosthesis for his left leg pro bono.  **Patient met with the Prosthetist for his Left Leg who brought initial test socket and will bring another test socket this week (05/18/23)and the patient is to wear the shrinker at all times except when bathing and can wear the shrinker with 2 layers to above his knee or with 1 layer above the knee in 3 to 4 layers more distally to apply more pressure for distal limb shaping.  Per the physical therapist he is to wear the liner during waking hours with initially starting 2 to 3 hours aware and then adding half an hour of wear each day until the patient gets used to wearing the liner.   Assessment and Plan: Sepsis due to cellulitis and gangrene of the left foot > status post left BKA Initially treated with IV vancomycin cefepime and  Flagyl - ultimately underwent left BKA 12/10/2022 - has received a left limb prosthesis - Physical Therapy continues to work with the patient on use of prosthesis and ambulation. Pt continues with PT   Housing instability Patient informed Dr. Benjamine Mola that he has established long-term housing at [name] and should be able to move in 06/11/2023 - plan is for d/c 06/11/23.Currently medically stable at this time   Anterior maxillary abscess -multiple periapical abscesses of teeth Status post I&D of anterior maxillary abscess and surgical extraction of numerous teeth as well as simple extraction of many other teeth and alveoloplasty x 4 quadrants 03/25/2023 - continue oral hygiene and benzocaine mucosal gel as needed   Unilateral hearing loss in setting of cerumen impaction Otoscope exam revealed severely cerumen impacted ear canal - case was discussed with ENT who suggested an outpatient evaluation with continued flushing of the ears and use of Debrox -Presently stable without complaints about hearing   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 Well compensated presently - TTE September 2023 noted EF <20% with global hypokinesis - continue usual Imdur Lasix and Coreg - patient has been offered outpatient Cardiology evaluation on numerous occasions but has a 100% no-show rate for office visits - he was evaluated to some extent at [facility] in 2020 - noncompliance has made him ineligible for advanced heart failure therapies - no difficulty with signif volume overload during this hospitalization thus far   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 October  2023 Intention at that time was for the patient to remain on lifelong anticoagulation - Eliquis was discontinued 12/09/2022 apparently in anticipation of surgery and has not been resumed since - given his long history of noncompliance and his current bilateral lower extremity amputation status I feel the risk of ongoing  anticoagulation outweighs the benefit at present   HTN Blood pressure reasonably well-controlled at this time   COPD Stable   Constipation Continue bowel regimen   Chronic pain appears comfortable on serial exam -with patient now well out from amputation will attempt to wean dose of chronic narcotics   Schizoaffective disorder/bipolar disorder Continue Lexapro and Abilify   Obesity - Body mass index is 29.3 kg/m.       Subjective: No complaints today. Eager to be discharged soon  Physical Exam: Vitals:   06/12/23 2001 06/13/23 0917 06/13/23 2046 06/14/23 0852  BP: (!) 156/98 118/73 (!) 142/86 (!) 154/88  Pulse: 74 70 76 60  Resp: 17 17 16 17   Temp:  98.2 F (36.8 C) 98 F (36.7 C) 98.6 F (37 C)  TempSrc: Oral Oral  Oral  SpO2: 95% 100% 98% 100%  Weight:      Height:       General exam: Awake, laying in bed, in nad Respiratory system: Normal respiratory effort, no wheezing Cardiovascular system: regular rate, s1, s2 Gastrointestinal system: Soft, nondistended, positive BS Central nervous system: CN2-12 grossly intact, strength intact Extremities: Perfused, no clubbing Skin: Normal skin turgor, no notable skin lesions seen Psychiatry: Mood normal // no visual hallucinations   Data Reviewed:  Labs reviewed: na 139, K 3.7, Cr 1.17, WBC 6.6, Hgb 13.9, Plts 236  Family Communication: Pt in room, family not at bedside  Disposition: Status is: Inpatient Remains inpatient appropriate because: severity of illness   Planned Discharge Destination: Home     Author: Rickey Barbara, MD 06/14/2023 3:48 PM  For on call review [email or web address].

## 2023-06-14 NOTE — Plan of Care (Signed)
  Problem: Pain Managment: Goal: General experience of comfort will improve Outcome: Not Progressing   Problem: Safety: Goal: Ability to remain free from injury will improve Outcome: Not Progressing   Problem: Skin Integrity: Goal: Risk for impaired skin integrity will decrease Outcome: Not Progressing   Problem: Education: Goal: Knowledge of General Education information will improve Description: Including pain rating scale, medication(s)/side effects and non-pharmacologic comfort measures Outcome: Not Progressing   Problem: Health Behavior/Discharge Planning: Goal: Ability to manage health-related needs will improve Outcome: Not Progressing   Problem: Clinical Measurements: Goal: Ability to maintain clinical measurements within normal limits will improve Outcome: Not Progressing Goal: Will remain free from infection Outcome: Not Progressing Goal: Diagnostic test results will improve Outcome: Not Progressing Goal: Respiratory complications will improve Outcome: Not Progressing Goal: Cardiovascular complication will be avoided Outcome: Not Progressing   Problem: Activity: Goal: Risk for activity intolerance will decrease Outcome: Not Progressing   Problem: Nutrition: Goal: Adequate nutrition will be maintained Outcome: Not Progressing   Problem: Coping: Goal: Level of anxiety will decrease Outcome: Not Progressing   Problem: Elimination: Goal: Will not experience complications related to bowel motility Outcome: Not Progressing Goal: Will not experience complications related to urinary retention Outcome: Not Progressing

## 2023-06-15 DIAGNOSIS — L03116 Cellulitis of left lower limb: Secondary | ICD-10-CM | POA: Diagnosis not present

## 2023-06-15 DIAGNOSIS — I96 Gangrene, not elsewhere classified: Secondary | ICD-10-CM | POA: Diagnosis not present

## 2023-06-15 DIAGNOSIS — Z89611 Acquired absence of right leg above knee: Secondary | ICD-10-CM | POA: Diagnosis not present

## 2023-06-15 NOTE — Plan of Care (Signed)
   Problem: Pain Managment: Goal: General experience of comfort will improve Outcome: Progressing   Problem: Safety: Goal: Ability to remain free from injury will improve Outcome: Progressing   Problem: Education: Goal: Knowledge of General Education information will improve Description: Including pain rating scale, medication(s)/side effects and non-pharmacologic comfort measures Outcome: Progressing

## 2023-06-15 NOTE — Progress Notes (Signed)
Progress Note   Patient: [name] WGN:[id number] DOB: [birth year] DOA: 12/07/2022     190 DOS: the patient was seen and examined on 06/15/2023   Brief hospital course: [name] is a 57 y.o. male with a history of heart failure, CAD, right BKA, COPD, hyperlipidemia, hyperlipidemia, polyneuropathy, schizoaffective/bipolar disorder, was admitted to the hospital with left lower extremity cellulitis and gangrene.   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      During hospitalization, patient was also noted to have maxillary abscess and periapical abscess and underwent oral surgery with I&D and of anterior maxillary abscess and surgical extraction of multiple teeth in alveoloplasty. Currently stable.   Hospital stay prolonged due to difficulty in placement due to homelessness status status post left BKA and right AKA.   [facility],  have offered to provide a prosthesis for LLE (the BKA)   The patient scheduled an appointment Monday (June 3rd, 2024) at noon to meet up with patient and the Prosthetist to cast the L residual limb and make a plan and patient to receive a prosthesis for his left leg pro bono.  **Patient met with the Prosthetist for his Left Leg who brought initial test socket and will bring another test socket this week (05/18/23)and the patient is to wear the shrinker at all times except when bathing and can wear the shrinker with 2 layers to above his knee or with 1 layer above the knee in 3 to 4 layers more distally to apply more pressure for distal limb shaping.  Per the physical therapist he is to wear the liner during waking hours with initially starting 2 to 3 hours aware and then adding half an hour of wear each day until the patient gets used to wearing the liner.   Assessment and Plan: Sepsis due to cellulitis and gangrene of the left foot > status post left BKA Initially treated with IV vancomycin cefepime and  Flagyl - ultimately underwent left BKA 12/10/2022 - has received a left limb prosthesis - Physical Therapy continues to work with the patient on use of prosthesis and ambulation. Pt had been working with PT   Housing instability Patient informed Dr. Benjamine Mola that he has established long-term housing at [name] and should be able to move in 06/11/2023 - plan is for d/c 06/11/23.Currently medically stable at this time   Anterior maxillary abscess -multiple periapical abscesses of teeth Status post I&D of anterior maxillary abscess and surgical extraction of numerous teeth as well as simple extraction of many other teeth and alveoloplasty x 4 quadrants 03/25/2023 - continue oral hygiene and benzocaine mucosal gel as needed   Unilateral hearing loss in setting of cerumen impaction Otoscope exam revealed severely cerumen impacted ear canal - case was discussed with ENT who suggested an outpatient evaluation with continued flushing of the ears and use of Debrox -Presently stable without complaints about hearing   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 Well compensated presently - TTE September 2023 noted EF <20% with global hypokinesis - continue usual Imdur Lasix and Coreg - patient has been offered outpatient Cardiology evaluation on numerous occasions but has a 100% no-show rate for office visits - he was evaluated to some extent at [facility] in 2020 - noncompliance has made him ineligible for advanced heart failure therapies - no difficulty with signif volume overload during this hospitalization thus far   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 Intention at that time was for the patient to remain on lifelong anticoagulation - Eliquis was discontinued 12/09/2022 apparently in anticipation of surgery and has not been resumed since - given his long history of noncompliance and his current bilateral lower extremity amputation status I feel the risk of  ongoing anticoagulation outweighs the benefit at present   HTN Blood pressure reasonably well-controlled at this time   COPD Stable   Constipation Continue bowel regimen   Chronic pain appears comfortable on serial exam -with patient now well out from amputation will attempt to wean dose of chronic narcotics   Schizoaffective disorder/bipolar disorder Continue Lexapro and Abilify   Obesity - Body mass index is 29.3 kg/m.       Subjective: Without complaints today. Eager to be discharged soon  Physical Exam: Vitals:   06/13/23 2046 06/14/23 0852 06/14/23 2055 06/15/23 1332  BP: (!) 142/86 (!) 154/88 (!) 147/99 (!) 162/96  Pulse: 76 60 76 80  Resp: 16 17 18 16   Temp:  98.6 F (37 C) 97.8 F (36.6 C) 98.2 F (36.8 C)  TempSrc:  Oral  Oral  SpO2: 98% 100% 100% 100%  Weight:      Height:       General exam: Conversant, in no acute distress Respiratory system: normal chest rise, clear, no audible wheezing Cardiovascular system: regular rhythm, s1-s2 Gastrointestinal system: Nondistended, nontender, pos BS Central nervous system: No seizures, no tremors Extremities: No cyanosis, no joint deformities Skin: No rashes, no pallor Psychiatry: Affect normal // no auditory hallucinations   Data Reviewed:  There are no new results to review at this time.  Family Communication: Pt in room, family not at bedside  Disposition: Status is: Inpatient Remains inpatient appropriate because: severity of illness   Planned Discharge Destination: Home     Author: Rickey Barbara, MD 06/15/2023 4:04 PM  For on call review [email or web address].

## 2023-06-15 NOTE — Plan of Care (Signed)
   Problem: Pain Managment: Goal: General experience of comfort will improve Outcome: Progressing   Problem: Safety: Goal: Ability to remain free from injury will improve Outcome: Progressing   Problem: Skin Integrity: Goal: Risk for impaired skin integrity will decrease Outcome: Progressing   

## 2023-06-15 NOTE — Progress Notes (Signed)
Patient refused his vitals 

## 2023-06-15 NOTE — Progress Notes (Signed)
Physical Therapy Treatment Patient Details Name: [name] MRN: [id number] DOB: 09/24/1966 Today's Date: 06/15/2023   History of Present Illness Pt is a 57 y/o M who presented 12/17/22 with L ankle cellulitis and underwent L BKA on 1/10. Oral abscesses noted, underwent oral surgery Wed, 4/24; Initiated fabrication of prosthesis for LLE on 6/3; recieved LLE prosthesis on 6/28; PMH includes asthma, bioplar 1, depression, schizoaffective disorder, CVA, chronic CHF, homelessness, HTN, DM2, cocaine and cannabis use, and R knee disarticulation    PT Comments  Continuing work on functional mobility and activity tolerance;  Initial focus was on efforts at donning prostehsis without physical assist, using the tactic of standing into the prosthesis that was successful in recent PT and OT sessions; Discussed that it is unlikely his bed at the apartment does not elevate like the hospital bed, and this PT demonstrated perhaps using the sink and counter to get upper body onto, push up to "stand" into the prosthesis; however when it came time for Raymon to possibly try, he declined trying to stand into the prosthesis this session;   A simple solution to giving [name] the ability to "stand into" his porsthesis would be getting him a lift chair, or even a hospital bed . . . Still, most insurance plans do not cover lift chairs, and his dc to his apartment is not dependent on getting a lift chair or hospital bed;   It is worth noting that Jassen is independent at wheelchair level, and this PT has no reservations re: Treasure discharging from the hospital to his apartment soon; he can continue the work of prosthesis training with Outpt PT and Prosthetist from [employer]; Outpt PT can also continue work on floor transfers if Muaad wants to continue work on that functional task; This PT understands that it is possible that HHPT/OT may not be available/covered by [employer] plan;   Spent some time on Delmos's case  outside of the timed PT session, discussing considerations with Okey Regal, [name]    Assistance Recommended at Discharge PRN  If plan is discharge home, recommend the following:  Can travel by private vehicle    Assist for transportation   Yes (Currently recommend a sliding board for car transfers)  Equipment Recommendations  Other (comment);Rolling walker (2 wheels) (tub bench, sliding board)    Recommendations for Other Services  (Check ins with Prosthetist)     Precautions / Restrictions Precautions Precautions: None Precaution Comments: R knee disrticulation, L BKA; Fall risk is present, but minimal Required Braces or Orthoses: Other Brace Other Brace: Highly recommend Mehmet sleep in a liner or shrinker to keep residual limb from swelling Restrictions Weight Bearing Restrictions: No RLE Weight Bearing: Weight bearing as tolerated LLE Weight Bearing: Weight bearing as tolerated     Mobility  Bed Mobility                    Transfers Overall transfer level:  (Modified independent with wc transfers)                      Ambulation/Gait                   Stairs             Wheelchair Mobility     Tilt Bed    Modified Rankin (Stroke Patients Only)       Balance     Sitting balance-Leahy Scale: Normal       Standing  balance-Leahy Scale: Poor                              Cognition Arousal/Alertness: Awake/alert Behavior During Therapy: WFL for tasks assessed/performed Overall Cognitive Status: Within Functional Limits for tasks assessed (for simple mobility tasks)                                 General Comments: When Jahir understands rationale for an exercise or activity, and that rationale is in line with his goals, he participates much better        Exercises      General Comments General comments (skin integrity, edema, etc.): REmoved  white liner from prosthesis to see about a better  fit      Pertinent Vitals/Pain Pain Assessment Pain Assessment: No/denies pain    Home Living                          Prior Function            PT Goals (current goals can now be found in the care plan section) Acute Rehab PT Goals Patient Stated Goal: Be able to use his prosthesis; Get ready for new apartment; is interested in working on floor transfers PT Goal Formulation: With patient Time For Goal Achievement: 06/09/23 Potential to Achieve Goals: Good Progress towards PT goals: Progressing toward goals    Frequency    Min 2X/week      PT Plan Current plan remains appropriate    Co-evaluation              AM-PAC PT "6 Clicks" Mobility   Outcome Measure  Help needed turning from your back to your side while in a flat bed without using bedrails?: None Help needed moving from lying on your back to sitting on the side of a flat bed without using bedrails?: None Help needed moving to and from a bed to a chair (including a wheelchair)?: None Help needed standing up from a chair using your arms (e.g., wheelchair or bedside chair)?: A Lot Help needed to walk in hospital room?: Total Help needed climbing 3-5 steps with a railing? : Total 6 Click Score: 16    End of Session Equipment Utilized During Treatment: Other (comment) (prosthesis) Activity Tolerance: Patient tolerated treatment well Patient left:  (managing independently) Nurse Communication: Mobility status PT Visit Diagnosis: Muscle weakness (generalized) (M62.81);Other abnormalities of gait and mobility (R26.89)     Time: 6213-0865 PT Time Calculation (min) (ACUTE ONLY): 22 min  Charges:    $Self Care/Home Management: 8-22 PT General Charges $$ ACUTE PT VISIT: 1 Visit                     Van Clines, PT  Acute Rehabilitation Services Office [phone] Secure Chat welcomed    [name] 06/15/2023, 4:52 PM

## 2023-06-15 NOTE — Plan of Care (Signed)
  Problem: Pain Managment: Goal: General experience of comfort will improve Outcome: Progressing   Problem: Safety: Goal: Ability to remain free from injury will improve Outcome: Progressing   Problem: Skin Integrity: Goal: Risk for impaired skin integrity will decrease Outcome: Progressing   Problem: Education: Goal: Knowledge of General Education information will improve Description: Including pain rating scale, medication(s)/side effects and non-pharmacologic comfort measures Outcome: Progressing   

## 2023-06-16 ENCOUNTER — Other Ambulatory Visit (HOSPITAL_COMMUNITY): Payer: Self-pay

## 2023-06-16 ENCOUNTER — Telehealth (HOSPITAL_COMMUNITY): Payer: Self-pay | Admitting: Pharmacy Technician

## 2023-06-16 DIAGNOSIS — Z89611 Acquired absence of right leg above knee: Secondary | ICD-10-CM | POA: Diagnosis not present

## 2023-06-16 DIAGNOSIS — L03116 Cellulitis of left lower limb: Secondary | ICD-10-CM | POA: Diagnosis not present

## 2023-06-16 DIAGNOSIS — I96 Gangrene, not elsewhere classified: Secondary | ICD-10-CM | POA: Diagnosis not present

## 2023-06-16 MED ORDER — AMLODIPINE BESYLATE 10 MG PO TABS
10.0000 mg | ORAL_TABLET | Freq: Every day | ORAL | 0 refills | Status: DC
  Filled 2023-06-16: qty 30, 30d supply, fill #0

## 2023-06-16 MED ORDER — ARIPIPRAZOLE 5 MG PO TABS
5.0000 mg | ORAL_TABLET | Freq: Every day | ORAL | 0 refills | Status: DC
  Filled 2023-06-16: qty 30, 30d supply, fill #0

## 2023-06-16 MED ORDER — ESCITALOPRAM OXALATE 5 MG PO TABS
5.0000 mg | ORAL_TABLET | Freq: Every day | ORAL | 0 refills | Status: DC
  Filled 2023-06-16: qty 30, 30d supply, fill #0

## 2023-06-16 MED ORDER — CARVEDILOL 12.5 MG PO TABS
12.5000 mg | ORAL_TABLET | Freq: Two times a day (BID) | ORAL | 0 refills | Status: DC
  Filled 2023-06-16: qty 60, 30d supply, fill #0

## 2023-06-16 MED ORDER — FUROSEMIDE 20 MG PO TABS
20.0000 mg | ORAL_TABLET | Freq: Every day | ORAL | 0 refills | Status: DC
  Filled 2023-06-16: qty 30, 30d supply, fill #0

## 2023-06-16 MED ORDER — BACLOFEN 5 MG PO TABS
5.0000 mg | ORAL_TABLET | Freq: Three times a day (TID) | ORAL | 0 refills | Status: DC | PRN
  Filled 2023-06-16: qty 20, 7d supply, fill #0

## 2023-06-16 MED ORDER — ALBUTEROL SULFATE HFA 108 (90 BASE) MCG/ACT IN AERS
2.0000 | INHALATION_SPRAY | Freq: Four times a day (QID) | RESPIRATORY_TRACT | 0 refills | Status: DC | PRN
  Filled 2023-06-16: qty 18, 25d supply, fill #0

## 2023-06-16 MED ORDER — ISOSORB DINITRATE-HYDRALAZINE 20-37.5 MG PO TABS
1.0000 | ORAL_TABLET | Freq: Three times a day (TID) | ORAL | 0 refills | Status: AC
Start: 2023-06-16 — End: 2023-07-16
  Filled 2023-06-16: qty 90, 30d supply, fill #0

## 2023-06-16 MED ORDER — OXYCODONE HCL 5 MG PO TABS
5.0000 mg | ORAL_TABLET | ORAL | 0 refills | Status: DC | PRN
  Filled 2023-06-16: qty 20, 4d supply, fill #0

## 2023-06-16 MED ORDER — GABAPENTIN 300 MG PO CAPS
900.0000 mg | ORAL_CAPSULE | Freq: Three times a day (TID) | ORAL | 0 refills | Status: DC
  Filled 2023-06-16: qty 270, 30d supply, fill #0

## 2023-06-16 MED ORDER — OXYCODONE HCL ER 10 MG PO T12A
20.0000 mg | EXTENDED_RELEASE_TABLET | Freq: Two times a day (BID) | ORAL | 0 refills | Status: DC
  Filled 2023-06-16: qty 20, 5d supply, fill #0

## 2023-06-16 NOTE — Plan of Care (Signed)
  Problem: Pain Managment: Goal: General experience of comfort will improve Outcome: Progressing   Problem: Safety: Goal: Ability to remain free from injury will improve Outcome: Progressing   Problem: Skin Integrity: Goal: Risk for impaired skin integrity will decrease Outcome: Progressing   Problem: Health Behavior/Discharge Planning: Goal: Ability to manage health-related needs will improve Outcome: Progressing

## 2023-06-16 NOTE — Plan of Care (Signed)
  Problem: Pain Managment: Goal: General experience of comfort will improve 06/16/2023 1256 by Evern Core, RN Outcome: Adequate for Discharge 06/16/2023 0816 by Evern Core, RN Outcome: Progressing   Problem: Safety: Goal: Ability to remain free from injury will improve 06/16/2023 1256 by Evern Core, RN Outcome: Adequate for Discharge 06/16/2023 0816 by Evern Core, RN Outcome: Progressing   Problem: Skin Integrity: Goal: Risk for impaired skin integrity will decrease 06/16/2023 1256 by Evern Core, RN Outcome: Adequate for Discharge 06/16/2023 0816 by Evern Core, RN Outcome: Progressing   Problem: Education: Goal: Knowledge of the prescribed therapeutic regimen will improve Outcome: Adequate for Discharge Goal: Ability to verbalize activity precautions or restrictions will improve Outcome: Adequate for Discharge Goal: Understanding of discharge needs will improve Outcome: Adequate for Discharge   Problem: Activity: Goal: Ability to perform//tolerate increased activity and mobilize with assistive devices will improve Outcome: Adequate for Discharge   Problem: Clinical Measurements: Goal: Postoperative complications will be avoided or minimized Outcome: Adequate for Discharge   Problem: Self-Care: Goal: Ability to meet self-care needs will improve Outcome: Adequate for Discharge   Problem: Self-Concept: Goal: Ability to maintain and perform role responsibilities to the fullest extent possible will improve Outcome: Adequate for Discharge   Problem: Pain Management: Goal: Pain level will decrease with appropriate interventions Outcome: Adequate for Discharge   Problem: Skin Integrity: Goal: Demonstration of wound healing without infection will improve Outcome: Adequate for Discharge   Problem: Education: Goal: Knowledge of General Education information will improve Description: Including pain rating scale, medication(s)/side effects and  non-pharmacologic comfort measures Outcome: Adequate for Discharge   Problem: Health Behavior/Discharge Planning: Goal: Ability to manage health-related needs will improve 06/16/2023 1256 by Evern Core, RN Outcome: Adequate for Discharge 06/16/2023 0816 by Evern Core, RN Outcome: Progressing   Problem: Clinical Measurements: Goal: Ability to maintain clinical measurements within normal limits will improve Outcome: Adequate for Discharge Goal: Will remain free from infection Outcome: Adequate for Discharge Goal: Diagnostic test results will improve Outcome: Adequate for Discharge Goal: Respiratory complications will improve Outcome: Adequate for Discharge Goal: Cardiovascular complication will be avoided Outcome: Adequate for Discharge   Problem: Activity: Goal: Risk for activity intolerance will decrease Outcome: Adequate for Discharge   Problem: Nutrition: Goal: Adequate nutrition will be maintained Outcome: Adequate for Discharge   Problem: Coping: Goal: Level of anxiety will decrease Outcome: Adequate for Discharge   Problem: Elimination: Goal: Will not experience complications related to bowel motility Outcome: Adequate for Discharge Goal: Will not experience complications related to urinary retention Outcome: Adequate for Discharge   Problem: Pain Managment: Goal: General experience of comfort will improve Outcome: Adequate for Discharge   Problem: Safety: Goal: Ability to remain free from injury will improve Outcome: Adequate for Discharge   Problem: Skin Integrity: Goal: Risk for impaired skin integrity will decrease Outcome: Adequate for Discharge

## 2023-06-16 NOTE — TOC Progression Note (Signed)
Transition of Care [facility]) - Progression Note    Patient Details  Name: [name] MRN: [id number] Date of Birth: 10-19-1966  [facility] CM/SW Contact  [name], [name] Phone Number: 06/16/2023, 10:06 AM  Clinical Narrative:    Patient will discharge to his apartment today at 1pm. Patient will be transported via [employer]. Patient's new address is [address], [place], [name].  Follow up appointments have been arranged. CSW placed appointment dates on a calendar along with important phone numbers to have once discharged. Patient has an ACTT team and will be followed closely by [name] staff after discharge. Patient is connected with [name] of the [place] [facility] Response Team. Patient's apartment was furnished with [employer] and all furniture has been delivered.  There is a final meeting with [name] and [name] staff today at patient's bedside at 12pm.  MD to send medications to [facility] to have them delivered before patient discharges. Expected Discharge Plan: Home/Self Care Barriers to Discharge: Homeless with medical needs  Expected Discharge Plan and Services   Discharge Planning Services: CM Consult Post Acute Care Choice: Home Health Living arrangements for the past 2 months: Homeless                                       Social Determinants of Health (SDOH) Interventions SDOH Screenings   Food Insecurity: Food Insecurity Present (11/24/2022)  Housing: Medium Risk (12/07/2022)  Transportation Needs: Unmet Transportation Needs (12/07/2022)  Utilities: Not At Risk (12/07/2022)  Alcohol Screen: Medium Risk (06/24/2019)  Depression (PHQ2-9): Medium Risk (02/10/2021)  Financial Resource Strain: High Risk (07/24/2021)   Received from [facility]  Social Connections: Unknown (04/05/2022)   Received from [facility]  Stress: Stress Concern Present (07/24/2021)   Received from [facility]  Tobacco Use: High Risk (03/25/2023)    Readmission Risk Interventions    12/09/2022    9:02 AM 12/01/2022    3:56 PM 08/18/2022    9:00 AM  Readmission Risk Prevention Plan  Transportation Screening Complete Complete Complete  Medication Review [employer]) Complete Complete Complete  PCP or Specialist appointment within 3-5 days of discharge Complete Complete Complete  HRI or Home Care Consult Complete Complete Complete  SW Recovery Care/Counseling Consult Complete Complete Complete  Palliative Care Screening Not Applicable Not Applicable Not Applicable  Skilled Nursing Facility Not Applicable Not Applicable Not Applicable  SNF Comments  n/a

## 2023-06-16 NOTE — Telephone Encounter (Signed)
Pharmacy Patient Advocate Encounter   Received notification that prior authorization for OxyCONTIN 10MG  er tablets is required/requested.   Insurance verification completed.   The patient is insured through [employer] [place] .   Per test claim: PA submitted to [facility] via CoverMyMeds Key/confirmation #/EOC [id number] Status is pending

## 2023-06-16 NOTE — Progress Notes (Signed)
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 Oral abscesses noted, underwent oral surgery Wed, 4/24; Initiated fabrication of prosthesis for LLE on 6/3; recieved LLE prosthesis on 6/28; PMH includes asthma, bioplar 1, depression, schizoaffective disorder, CVA, chronic CHF, homelessness, HTN, DM2, cocaine and cannabis use, and R knee disarticulation    PT Comments  Session focused on prosthesis use, fit, donning, and problem-solving through getting a good fit; Took white plastic liner out, and locking in was too easy -- a loose fit can lead to more pain with pressure at less pressure-tolerant points; [name] then opted to put on one of the socks that Prosthetist from [employer] provided; Chike is showing good considerations of options, carryover of education, and problem-solving re: his L prosthesis; I anticipate he will participate and progress well with Prosthetist and Outpt PT for more advanced mobility and Prosthesis Training, and looking ahead to a R prosthesis;   It is worth noting that Kaevon is independent at wheelchair level, and this PT has no reservations re: Jaleil discharging from the hospital to his apartment soon; he can continue the work of prosthesis training with Outpt PT and Prosthetist from [employer]; Outpt PT can also continue work on floor transfers if Leng wants to continue work on that functional task; This PT understands that it is possible that HHPT/OT may not be available/covered by [employer] insurance plan     Assistance Recommended at Discharge PRN  If plan is discharge home, recommend the following:  Can travel by private vehicle    Assist for transportation   Yes (Currently recommend a sliding board for car transfers)  Equipment Recommendations  Other (comment);Rolling walker  (2 wheels) (tub bench, sliding board)   Consider a lift chair for help donning prosthesis (this can be addressed at any point, and dc is not contingent upon getting a lift chair)   Recommendations for Other Services  (Check ins with Prosthetist)     Precautions / Restrictions Precautions Precautions: None Precaution Comments: R knee disrticulation, L BKA; Fall risk is present, but minimal Required Braces or Orthoses: Other Brace Other Brace: Highly recommend Javed sleep in a liner or shrinker to keep residual limb from swelling Restrictions Weight Bearing Restrictions: No RLE Weight Bearing: Weight bearing as tolerated LLE Weight Bearing: Weight bearing as tolerated     Mobility  Bed Mobility                    Transfers Overall transfer level: Needs assistance (Modified independent with wc transfers) Equipment used: Rolling walker (2 wheels) (and bed height feature) Transfers: Sit to/from Stand Sit to Stand: Min guard, Min assist     Squat pivot transfers: Supervision     General transfer comment: Stood from extra-elevated bed onto prosthesis to get it completely 'clicked in' and locked with min assist to steady RW mostly; after prosthesis was correctly on, stood x1 from very high bed; cues to fully extend hips in standing with good success; Also performed bed to wheelchair transfer with prosthesis on, and Chalmers was able to accept more weight onto the prosthesis, and use it as a pivot point    Ambulation/Gait                   Stairs  Education officer, museum Details (indicate cue type and reason): Focused on managing legrest for L LE/prosthesis; this PT demonstrated how to put the legrest on, lock into place, and then unlock, swing away, and take the legrest off the wc; Able to put legrest on and off with supervision and cues   Tilt Bed    Modified Rankin (Stroke Patients Only)       Balance      Sitting balance-Leahy Scale: Normal       Standing balance-Leahy Scale: Poor                              Cognition Arousal/Alertness: Awake/alert Behavior During Therapy: WFL for tasks assessed/performed Overall Cognitive Status: Within Functional Limits for tasks assessed (for simple mobility tasks)                                 General Comments: When Obbie understands rationale for an exercise or activity, and that rationale is in line with his goals, he participates much better        Exercises      General Comments General comments (skin integrity, edema, etc.): Did not get locked into prosthesis first try; took white liner out, and clicked in quite easily; used 1 ply of sock today      Pertinent Vitals/Pain Pain Assessment Faces Pain Scale: Hurts a little bit Pain Location: L distal residual limb with weight bearing in prosthesis Pain Descriptors / Indicators: Grimacing    Home Living                          Prior Function            PT Goals (current goals can now be found in the care plan section) Acute Rehab PT Goals Patient Stated Goal: Be able to use his prosthesis; Get ready for new apartment; is interested in working on floor transfers PT Goal Formulation: With patient Time For Goal Achievement: 06/09/23 Potential to Achieve Goals: Good    Frequency    Min 2X/week      PT Plan Current plan remains appropriate    Co-evaluation              AM-PAC PT "6 Clicks" Mobility   Outcome Measure  Help needed turning from your back to your side while in a flat bed without using bedrails?: None Help needed moving from lying on your back to sitting on the side of a flat bed without using bedrails?: None Help needed moving to and from a bed to a chair (including a wheelchair)?: None Help needed standing up from a chair using your arms (e.g., wheelchair or bedside chair)?: A Lot Help needed to walk in  hospital room?: Total Help needed climbing 3-5 steps with a railing? : Total 6 Click Score: 16    End of Session Equipment Utilized During Treatment: Other (comment);Gait belt (prosthesis) Activity Tolerance: Patient tolerated treatment well Patient left:  (managing independently) Nurse Communication: Mobility status PT Visit Diagnosis: Muscle weakness (generalized) (M62.81);Other abnormalities of gait and mobility (R26.89)     Time: 1610-9604 PT Time Calculation (min) (ACUTE ONLY): 30 min  Charges:    $Therapeutic Activity: 23-37 mins PT General Charges $$ ACUTE PT VISIT: 1 Visit  Van Clines, PT  Acute Rehabilitation Services Office [phone] Secure Chat welcomed    [name] 06/16/2023, 12:59 PM

## 2023-06-16 NOTE — Discharge Summary (Addendum)
Physician Discharge Summary   Patient: [name] MRN: [id number] DOB: 04-Jan-1966  Admit date:     12/07/2022  Discharge date: 06/16/23  Discharge Physician: Rickey Barbara   PCP: [name], NP   Recommendations at discharge:    Follow up with PCP in 1-2 weeks Follow up with Dr. 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 male with a history of heart failure, CAD, right BKA, COPD, hyperlipidemia, hyperlipidemia, polyneuropathy, schizoaffective/bipolar disorder, was admitted to the hospital with left lower extremity cellulitis and gangrene.   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       During hospitalization, patient was also noted to have maxillary abscess and periapical abscess and underwent oral surgery with I&D and of anterior maxillary abscess and surgical extraction of multiple teeth in alveoloplasty. Currently stable.   Hospital stay prolonged due to difficulty in placement due to homelessness status status post left BKA and right AKA.   [facility],  have offered to provide a prosthesis for LLE (the BKA)    Assessment and Plan: Sepsis due to cellulitis and gangrene of the left foot > status post left BKA Initially treated with IV vancomycin cefepime and Flagyl  - ultimately underwent left BKA 12/10/2022 - had received a left limb prosthesis - Physical Therapy continued to work with the patient on use of prosthesis and ambulation. Pt had been working with PT   Housing instability Patient informed Dr. Benjamine Mola that he has established long-term housing at [name]. Currently medically stable at this time   Anterior maxillary abscess -multiple periapical abscesses of teeth Status post I&D of anterior maxillary abscess and surgical extraction of numerous teeth as well as simple extraction of many other teeth and alveoloplasty x 4 quadrants 03/25/2023 - continued oral hygiene and benzocaine mucosal gel as needed   Unilateral hearing loss in setting of cerumen impaction Otoscope exam revealed severely cerumen impacted ear canal - case was discussed with ENT who suggested an outpatient evaluation with continued flushing of the ears and use of Debrox -Presently stable without complaints about hearing   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 Well compensated presently - TTE September 2023 noted EF <20% with global hypokinesis - continue usual Imdur Lasix and Coreg - patient has been offered outpatient Cardiology evaluation on numerous occasions but has a 100% no-show rate for office visits - he was evaluated to some extent at [facility] in 2020 - noncompliance has made him ineligible for advanced heart failure therapies - no difficulty with signif volume overload during this hospitalization thus far   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 Intention at that time was for the patient to remain on lifelong anticoagulation - Eliquis was discontinued 12/09/2022 apparently in anticipation of surgery  and has not been resumed since - given his long history of noncompliance and his current bilateral lower extremity amputation status I feel the risk of ongoing anticoagulation outweighs the benefit at present   HTN Blood  pressure reasonably well-controlled at this time   COPD Stable   Constipation Continue bowel regimen   Chronic pain appears comfortable on serial exam -with patient now well out from amputation will attempt to wean dose of chronic narcotics   Schizoaffective disorder/bipolar disorder Continue Lexapro and Abilify   Obesity - Body mass index is 29.3 kg/m.    Consultants: Orthopedic Surgery, Psychiatry, Podiatry Procedures performed: dental removal with I/d of dental abscess,  LBKA Disposition: Home Diet recommendation:  Regular diet DISCHARGE MEDICATION: Allergies as of 06/16/2023       Reactions   Motrin [ibuprofen] Anaphylaxis   Tylenol [acetaminophen] Hives, Nausea And Vomiting, Rash   Lyrica Cr [pregabalin Er] Other (See Comments)   Dystonia Dysarthria    Peanut-containing Drug Products Other (See Comments)   Pt denies allergy to peanut butter 03/02/2021        Medication List     STOP taking these medications    amoxicillin-clavulanate 500-125 MG tablet Commonly known as: AUGMENTIN   Eliquis 5 MG Tabs tablet Generic drug: apixaban   folic acid 1 MG tablet Commonly known as: FOLVITE   isosorbide mononitrate 30 MG 24 hr tablet Commonly known as: IMDUR   losartan 25 MG tablet Commonly known as: COZAAR   thiamine 100 MG tablet Commonly known as: Vitamin B-1   traZODone 50 MG tablet Commonly known as: DESYREL       TAKE these medications    albuterol 108 (90 Base) MCG/ACT inhaler Commonly known as: VENTOLIN HFA Inhale 2 puffs into the lungs every 6 (six) hours as needed for wheezing or shortness of breath.   amLODipine 10 MG tablet Commonly known as: NORVASC Take 1 tablet (10 mg total) by mouth daily.   ARIPiprazole 5 MG tablet Commonly known as: ABILIFY Take 1 tablet (5 mg total) by mouth daily. Start taking on: June 17, 2023 What changed:  medication strength how much to take   Baclofen 5 MG Tabs Take 1 tablet (5 mg total) by mouth 3  (three) times daily as needed for muscle spasms.   carvedilol 12.5 MG tablet Commonly known as: COREG Take 1 tablet (12.5 mg total) by mouth 2 (two) times daily with a meal.   escitalopram 5 MG tablet Commonly known as: LEXAPRO Take 1 tablet (5 mg total) by mouth daily.   furosemide 20 MG tablet Commonly known as: LASIX Take 1 tablet (20 mg total) by mouth daily. Start taking on: June 17, 2023   gabapentin 300 MG capsule Commonly known as: NEURONTIN Take 3 capsules (900 mg total) by mouth 3 (three) times daily.   isosorbide-hydrALAZINE 20-37.5 MG tablet Commonly known as: BIDIL Take 1 tablet by mouth 3 (three) times daily.   oxyCODONE 5 MG immediate release tablet Commonly known as: Oxy IR/ROXICODONE Take 1 tablet (5 mg total) by mouth every 4 (four) hours as needed for severe pain. What changed:  how much to take reasons to take this   oxyCODONE 20 mg 12 hr tablet Commonly known as: OXYCONTIN Take 1 tablet (20 mg total) by mouth every 12 (twelve) hours. What changed: You were already taking a medication with the same name, and this prescription was added. Make sure you understand how and when to take each.  Durable Medical Equipment  (From admission, onward)           Start     Ordered   06/03/23 0904  For home use only DME Walker rolling  Once       Question Answer Comment  Walker: With 5 Inch Wheels   Patient needs a walker to treat with the following condition Amputation of leg (HCC)      06/03/23 0904   02/23/23 0952  For home use only DME wheelchair cushion (seat and back)  Once        02/23/23 0952   01/16/23 1205  For home use only DME Other see comment  Once       Comments: antitippers for wheelchair  Question:  Length of Need  Answer:  Lifetime   01/16/23 1204            Follow-up Information     Nadara Mustard, MD Follow up in 1 week(s).   Specialty: Orthopedic Surgery Contact information: [address] [place]  [name] [address] [phone]                Discharge Exam: Ceasar Mons Weights   05/09/23 0500 05/11/23 0428 05/12/23 2956  Weight: 90.7 kg 90 kg 90 kg   General exam: Awake, laying in bed, in nad Respiratory system: Normal respiratory effort, no wheezing Cardiovascular system: regular rate, s1, s2 Gastrointestinal system: Soft, nondistended, positive BS Central nervous system: CN2-12 grossly intact, strength intact Extremities: BLE amputation, no clubbing Skin: Normal skin turgor, no notable skin lesions seen Psychiatry: Mood normal // no visual hallucinations   Condition at discharge: fair  The results of significant diagnostics from this hospitalization (including imaging, microbiology, ancillary and laboratory) are listed below for reference.   Imaging Studies: DG FEMUR PORT, MIN 2 VIEWS RIGHT  Result Date: 05/17/2023 CLINICAL DATA:  [id number] Pain [id number] EXAM: RIGHT FEMUR PORTABLE 2 VIEW COMPARISON:  11/16/2021 FINDINGS: Interval right lower extremity amputation at the knee joint. Hip joint alignment is maintained without dislocation. Moderate degenerative changes of the right hip joint. No erosion or periosteal elevation. Soft tissue thickening at the distal stump. No soft tissue gas. IMPRESSION: Interval right lower extremity amputation at the knee joint. Soft tissue thickening at the distal stump. No acute osseous abnormality. Electronically Signed   By: Duanne Guess D.O.   On: 05/17/2023 16:32    Microbiology: Results for orders placed or performed during the hospital encounter of 12/07/22  Culture, blood (Routine X 2) w Reflex to ID Panel     Status: None   Collection Time: 12/07/22  5:30 PM   Specimen: BLOOD  Result Value Ref Range Status   Specimen Description   Final    BLOOD RIGHT ANTECUBITAL Performed at [facility], [address], [place], [name] [address]    Special Requests   Final    BOTTLES DRAWN AEROBIC AND ANAEROBIC Blood Culture adequate  volume Performed at [facility], [address], [place], [name] [address]    Culture   Final    NO GROWTH 5 DAYS Performed at [facility], [address] [place], [name] [address]    Report Status 12/12/2022 FINAL  Final  Culture, blood (Routine X 2) w Reflex to ID Panel     Status: None   Collection Time: 12/07/22  5:30 PM   Specimen: BLOOD  Result Value Ref Range Status   Specimen Description   Final    BLOOD SITE  NOT SPECIFIED Performed at [facility], [address], [place], [name] [address]    Special Requests   Final    BOTTLES DRAWN AEROBIC ONLY Blood Culture results may not be optimal due to an excessive volume of blood received in culture bottles Performed at [facility], [address], [place], [name] [address]    Culture   Final    NO GROWTH 5 DAYS Performed at [facility], [address], [place], [name] [address]    Report Status 12/12/2022 FINAL  Final  Surgical pcr screen     Status: None   Collection Time: 12/10/22  1:51 PM   Specimen: Nasal Mucosa; Nasal Swab  Result Value Ref Range Status   MRSA, PCR NEGATIVE NEGATIVE Final   Staphylococcus aureus NEGATIVE NEGATIVE Final    Comment: (NOTE) The Xpert SA Assay (FDA approved for NASAL specimens in patients 19 years of age and older), is one component of a comprehensive surveillance program. It is not intended to diagnose infection nor to guide or monitor treatment. Performed at [facility], [address] [place], [name] [address]   Aerobic/Anaerobic Culture w Gram Stain (surgical/deep wound)     Status: Abnormal   Collection Time: 03/25/23  1:33 PM   Specimen: PATH ENT biopsy; Tissue  Result Value Ref Range Status   Specimen Description ABSCESS  Final   Special Requests MAXILLARY ABSCESS PT ON AUGMENTIN  Final   Gram Stain   Final    FEW WBC PRESENT, PREDOMINANTLY PMN NO ORGANISMS SEEN    Culture (A)  Final     MULTIPLE ORGANISMS PRESENT, NONE PREDOMINANT NO GROUP A STREP (S.PYOGENES) ISOLATED NO STAPHYLOCOCCUS AUREUS ISOLATED RARE PREVOTELLA DISIENS BETA LACTAMASE POSITIVE Performed at [facility], [address], [place], [name] [address]    Report Status 04/02/2023 FINAL  Final    Labs: CBC: Recent Labs  Lab 06/13/23 1443  WBC 6.6  HGB 13.9  HCT 41.0  MCV 90.5  PLT 236   Basic Metabolic Panel: Recent Labs  Lab 06/13/23 1443  NA 139  K 3.7  CL 104  CO2 22  GLUCOSE 132*  BUN 16  CREATININE 1.17  CALCIUM 9.0   Liver Function Tests: Recent Labs  Lab 06/13/23 1443  AST 19  ALT 19  ALKPHOS 72  BILITOT 0.3  PROT 7.4  ALBUMIN 3.8   CBG: No results for input(s): "GLUCAP" in the last 168 hours.  Discharge time spent: less than 30 minutes.  Signed: Rickey Barbara, MD [facility] 06/16/2023

## 2023-06-17 NOTE — Telephone Encounter (Signed)
Pharmacy Patient Advocate Encounter  Received notification from [facility] that Prior Authorization for OxyCONTIN 10MG  er tablets  has been DENIED because  .Marland Kitchen    PA #/Case ID/Reference #: [id number]

## 2023-06-23 ENCOUNTER — Inpatient Hospital Stay: Payer: Medicaid Other | Admitting: Family Medicine

## 2023-07-24 ENCOUNTER — Inpatient Hospital Stay: Payer: Medicaid Other | Admitting: Nurse Practitioner

## 2023-07-28 ENCOUNTER — Ambulatory Visit (INDEPENDENT_AMBULATORY_CARE_PROVIDER_SITE_OTHER): Payer: MEDICAID | Admitting: Family Medicine

## 2023-07-28 ENCOUNTER — Encounter: Payer: Self-pay | Admitting: Family Medicine

## 2023-07-28 VITALS — BP 122/83 | HR 71 | Temp 97.0°F | Resp 16

## 2023-07-28 DIAGNOSIS — E1169 Type 2 diabetes mellitus with other specified complication: Secondary | ICD-10-CM | POA: Diagnosis not present

## 2023-07-28 DIAGNOSIS — Z89611 Acquired absence of right leg above knee: Secondary | ICD-10-CM

## 2023-07-28 DIAGNOSIS — G894 Chronic pain syndrome: Secondary | ICD-10-CM | POA: Diagnosis not present

## 2023-07-28 DIAGNOSIS — I10 Essential (primary) hypertension: Secondary | ICD-10-CM

## 2023-07-28 DIAGNOSIS — F250 Schizoaffective disorder, bipolar type: Secondary | ICD-10-CM | POA: Diagnosis not present

## 2023-07-28 DIAGNOSIS — Z7689 Persons encountering health services in other specified circumstances: Secondary | ICD-10-CM

## 2023-07-28 DIAGNOSIS — J441 Chronic obstructive pulmonary disease with (acute) exacerbation: Secondary | ICD-10-CM

## 2023-07-28 DIAGNOSIS — Z89512 Acquired absence of left leg below knee: Secondary | ICD-10-CM

## 2023-07-28 DIAGNOSIS — F1910 Other psychoactive substance abuse, uncomplicated: Secondary | ICD-10-CM

## 2023-07-28 DIAGNOSIS — Z0289 Encounter for other administrative examinations: Secondary | ICD-10-CM

## 2023-07-28 DIAGNOSIS — F17210 Nicotine dependence, cigarettes, uncomplicated: Secondary | ICD-10-CM

## 2023-07-28 NOTE — Progress Notes (Signed)
Established care.  Follow up from a hospital stay. Talk with MD about pain management

## 2023-07-30 ENCOUNTER — Encounter: Payer: Self-pay | Admitting: Family Medicine

## 2023-07-30 NOTE — Progress Notes (Signed)
New Patient Office Visit  Subjective    Patient ID: [name], male    DOB: 1966/06/23  Age: 57 y.o. MRN: [id number]  CC:  Chief Complaint  Patient presents with   Establish Care    Follow up from a hospital stay.  Talk to MD about pain management    [name] presents to establish care and for hospital follow up with review of chronic med issues.    Outpatient Encounter Medications as of 07/28/2023  Medication Sig   albuterol (VENTOLIN HFA) 108 (90 Base) MCG/ACT inhaler Inhale 2 puffs into the lungs every 6 (six) hours as needed for wheezing or shortness of breath.   amLODipine (NORVASC) 10 MG tablet Take 1 tablet (10 mg total) by mouth daily.   ARIPiprazole (ABILIFY) 5 MG tablet Take 1 tablet (5 mg total) by mouth daily.   Baclofen 5 MG TABS Take 1 tablet (5 mg total) by mouth 3 (three) times daily as needed for muscle spasms.   carvedilol (COREG) 12.5 MG tablet Take 1 tablet (12.5 mg total) by mouth 2 (two) times daily with a meal.   escitalopram (LEXAPRO) 5 MG tablet Take 1 tablet (5 mg total) by mouth daily.   furosemide (LASIX) 20 MG tablet Take 1 tablet (20 mg total) by mouth daily.   gabapentin (NEURONTIN) 300 MG capsule Take 3 capsules (900 mg total) by mouth 3 (three) times daily.   oxyCODONE (OXY IR/ROXICODONE) 5 MG immediate release tablet Take 1 tablet (5 mg total) by mouth every 4 (four) hours as needed for severe pain.   oxyCODONE (OXYCONTIN) 10 mg 12 hr tablet Take 2 tablets (20 mg total) by mouth every 12 (twelve) hours.   No facility-administered encounter medications on file as of 07/28/2023.    Past Medical History:  Diagnosis Date   Asthma    Bipolar 1 disorder (HCC)    CHF (congestive heart failure) (HCC)    COPD (chronic obstructive pulmonary disease) (HCC)    Depression    Diabetes mellitus without complication (HCC)    GERD (gastroesophageal reflux disease)    Homelessness    Hypertension    QT prolongation    Schizoaffective  disorder (HCC)     Past Surgical History:  Procedure Laterality Date   AMPUTATION Left 12/10/2022   Procedure: LEFT BELOW KNEE AMPUTATION;  Surgeon: Nadara Mustard, MD;  Location: [facility] OR;  Service: Orthopedics;  Laterality: Left;   AMPUTATION TOE Bilateral 01/02/2021   Procedure: AMPUTATION TOE;  Surgeon: Park Liter, DPM;  Location: [facility] OR;  Service: Podiatry;  Laterality: Bilateral;  Ok to leave patient on hospital bed   IR ANGIO INTRA EXTRACRAN SEL COM CAROTID INNOMINATE BILAT MOD SED  12/17/2020   IR ANGIO VERTEBRAL SEL VERTEBRAL BILAT MOD SED  12/17/2020   MULTIPLE EXTRACTIONS WITH ALVEOLOPLASTY N/A 03/25/2023   Procedure: IRRIGATION AND DEBRIDEMENT, MULTIPLE EXTRACTION WITH ALVEOLOPLASTY X 4 , EXTRACTIONS 4 THROUGH 13, 20 THROUGHT 29;  Surgeon: Vivia Ewing, DMD;  Location: [facility] OR;  Service: Oral Surgery;  Laterality: N/A;   RADIOLOGY WITH ANESTHESIA N/A 12/17/2020   Procedure: IR WITH ANESTHESIA;  Surgeon: Radiologist, Medication, MD;  Location: [facility] OR;  Service: Radiology;  Laterality: N/A;    Family History  Problem Relation Age of Onset   Drug abuse Mother    Suicidality Mother     Social History   Socioeconomic History   Marital status: Single    Spouse name: Not on file   Number of children:  4   Years of education: Not on file   Highest education level: Not on file  Occupational History   Occupation: N/A  Tobacco Use   Smoking status: Some Days    Types: Cigarettes   Smokeless tobacco: Never  Vaping Use   Vaping status: Never Used  Substance and Sexual Activity   Alcohol use: Yes   Drug use: Yes    Types: Cocaine   Sexual activity: Not Currently    Partners: Female    Birth control/protection: None  Other Topics Concern   Not on file  Social History Narrative   ** Merged History Encounter **       Social Determinants of Health   Financial Resource Strain: High Risk (07/24/2021)   [facility] from [facility], [facility]   Overall Financial Resource  Strain (CARDIA)    Difficulty of Paying Living Expenses: Hard  Food Insecurity: Food Insecurity Present (11/24/2022)   Hunger Vital Sign    Worried About Running Out of Food in the Last Year: Often true    Ran Out of Food in the Last Year: Often true  Transportation Needs: Unmet Transportation Needs (12/07/2022)   PRAPARE - Transportation    Lack of Transportation (Medical): No    Lack of Transportation (Non-Medical): Yes  Physical Activity: Not on file  Stress: Stress Concern Present (07/24/2021)   Received from [facility], [facility]   [employer] of Occupational Health - Occupational Stress Questionnaire    Feeling of Stress : To some extent  Social Connections: Unknown (04/05/2022)   Received from [facility], [facility]   Social Network    Social Network: Not on file  Intimate Partner Violence: Not At Risk (12/07/2022)   Humiliation, Afraid, Rape, and Kick questionnaire    Fear of Current or Ex-Partner: No    Emotionally Abused: No    Physically Abused: No    Sexually Abused: No    Review of Systems  All other systems reviewed and are negative.       Objective    BP 122/83 (BP Location: Left Arm, Patient Position: Sitting, Cuff Size: Large)   Pulse 71   Temp (!) 97 F (36.1 C) (Oral)   Resp 16   SpO2 96%   Physical Exam Vitals and nursing note reviewed.  Constitutional:      General: He is not in acute distress. Cardiovascular:     Rate and Rhythm: Normal rate and regular rhythm.  Pulmonary:     Effort: Pulmonary effort is normal.     Breath sounds: Normal breath sounds.  Abdominal:     Palpations: Abdomen is soft.     Tenderness: There is no abdominal tenderness.  Musculoskeletal:     Comments: Utilizing wheelchair     Right Lower Extremity: Right leg is amputated above knee.     Left Lower Extremity: Left leg is amputated below knee.  Neurological:     General: No focal deficit present.     Mental Status: He is alert and oriented to  person, place, and time.  Psychiatric:        Mood and Affect: Affect is angry.        Behavior: Behavior is agitated and aggressive.         Assessment & Plan:   1. Chronic pain syndrome Referral to pain management. - Ambulatory referral to Pain Clinic  2. Encounter for completion of form with patient FL2 completed  3. Essential hypertension   4. Type 2  diabetes mellitus with other specified complication, unspecified whether long term insulin use (HCC)  - POCT glycosylated hemoglobin (Hb A1C)  5. Schizoaffective disorder, bipolar type (HCC)   6. Substance abuse (HCC)   7. COPD exacerbation (HCC)   8. S/P AKA (above knee amputation), right (HCC)   9. Hx of BKA, left (HCC)   10. Encounter to establish care     No follow-ups on file.   Tommie Raymond, MD

## 2023-08-04 LAB — POCT GLYCOSYLATED HEMOGLOBIN (HGB A1C): Hemoglobin A1C: 11.2 % — AB (ref 4.0–5.6)

## 2023-08-07 NOTE — Progress Notes (Signed)
This encounter was created in error - please disregard.

## 2023-08-17 ENCOUNTER — Telehealth: Payer: Self-pay | Admitting: Family Medicine

## 2023-08-17 NOTE — Telephone Encounter (Signed)
Form has been faxed.

## 2023-08-17 NOTE — Telephone Encounter (Signed)
[name], Child psychotherapist, has called to check about this form. Advised [name] that the form has been faxed. Patient is requesting 30 day supply of his medication to take to the treatment center, Jonny Ruiz stated it is on the form, the names of the medications he needs. [name] did not know the name of any of the medications that patient will need so I could not send an RX request.    [name]'s callback # [phone]

## 2023-08-17 NOTE — Telephone Encounter (Signed)
Patient dropped off document  PHYSICIAN'S STATEMENT , to be filled out by provider. Patient requested to send it back via Fax within 5-days. Document is located in providers tray at front office.Please FAX DOCUMENT TO [phone].

## 2023-08-25 ENCOUNTER — Other Ambulatory Visit: Payer: Self-pay | Admitting: Family Medicine

## 2023-08-25 NOTE — Telephone Encounter (Signed)
[name] from [facility] called and requested to have all patients medical meds sent to [facility] so patient will be able to pick all his meds up from the same location. [facility] - [place], [name] - [address] Phone: [phone]  Fax: [phone]

## 2023-08-26 NOTE — Telephone Encounter (Signed)
Requested medication (s) are due for refill today -yes  Requested medication (s) are on the active medication list -yes  Future visit scheduled -no  Last refill: 06/16/23 for all requested medications   Notes to clinic: all medications requested were last filled by outside provider(hospital provider)- needs PCP review . (Change in pharmacy)  Requested Prescriptions  Pending Prescriptions Disp Refills   albuterol (VENTOLIN HFA) 108 (90 Base) MCG/ACT inhaler 18 g 0    Sig: Inhale 2 puffs into the lungs every 6 (six) hours as needed for wheezing or shortness of breath.     Pulmonology:  Beta Agonists 2 Passed - 08/25/2023  3:14 PM      Passed - Last BP in normal range    BP Readings from Last 1 Encounters:  07/28/23 122/83         Passed - Last Heart Rate in normal range    Pulse Readings from Last 1 Encounters:  07/28/23 71         Passed - Valid encounter within last 12 months    Recent Outpatient Visits           4 weeks ago Chronic pain syndrome   [name], Lauris Poag, MD               amLODipine (NORVASC) 10 MG tablet 30 tablet 0    Sig: Take 1 tablet (10 mg total) by mouth daily.     Cardiovascular: Calcium Channel Blockers 2 Passed - 08/25/2023  3:14 PM      Passed - Last BP in normal range    BP Readings from Last 1 Encounters:  07/28/23 122/83         Passed - Last Heart Rate in normal range    Pulse Readings from Last 1 Encounters:  07/28/23 71         Passed - Valid encounter within last 6 months    Recent Outpatient Visits           4 weeks ago Chronic pain syndrome   [facility], Lauris Poag, MD               ARIPiprazole (ABILIFY) 5 MG tablet 30 tablet 0    Sig: Take 1 tablet (5 mg total) by mouth daily.     Not Delegated - Psychiatry:  Antipsychotics - Second Generation (Atypical) - aripiprazole Failed - 08/25/2023  3:14 PM      Failed - This refill cannot be  delegated      Failed - TSH in normal range and within 360 days    TSH  Date Value Ref Range Status  08/17/2022 1.974 0.350 - 4.500 uIU/mL Final    Comment:    Performed by a 3rd Generation assay with a functional sensitivity of <=0.01 uIU/mL. Performed at [facility], [address], [place], [name] [address]          Failed - Lipid Panel in normal range within the last 12 months    Cholesterol  Date Value Ref Range Status  03/04/2022 109 0 - 200 mg/dL Final   LDL Cholesterol  Date Value Ref Range Status  03/04/2022 45 0 - 99 mg/dL Final    Comment:           Total Cholesterol/HDL:CHD Risk Coronary Heart Disease Risk Table  Men   Women  1/2 Average Risk   3.4   3.3  Average Risk       5.0   4.4  2 X Average Risk   9.6   7.1  3 X Average Risk  23.4   11.0        Use the calculated Patient Ratio above and the CHD Risk Table to determine the patient's CHD Risk.        ATP III CLASSIFICATION (LDL):  <100     mg/dL   Optimal  469-629  mg/dL   Near or Above                    Optimal  130-159  mg/dL   Borderline  528-413  mg/dL   High  >244     mg/dL   Very High Performed at [facility], [address] [place], [name] [address]    HDL  Date Value Ref Range Status  03/04/2022 55 >40 mg/dL Final   Triglycerides  Date Value Ref Range Status  07/14/2022 52 <150 mg/dL Final    Comment:    Performed at [facility], [address] [place], [name] [address]         Passed - Completed PHQ-2 or PHQ-9 in the last 360 days      Passed - Last BP in normal range    BP Readings from Last 1 Encounters:  07/28/23 122/83         Passed - Last Heart Rate in normal range    Pulse Readings from Last 1 Encounters:  07/28/23 71         Passed - Valid encounter within last 6 months    Recent Outpatient Visits           4 weeks ago Chronic pain syndrome   [facility], Lauris Poag, MD              Passed - CBC within normal limits and completed in the last 12 months    WBC  Date Value Ref Range Status  06/13/2023 6.6 4.0 - 10.5 K/uL Final   RBC  Date Value Ref Range Status  06/13/2023 4.53 4.22 - 5.81 MIL/uL Final   Hemoglobin  Date Value Ref Range Status  06/13/2023 13.9 13.0 - 17.0 g/dL Final   Total hemoglobin  Date Value Ref Range Status  07/16/2022 12.3 12.0 - 16.0 g/dL Final   HCT  Date Value Ref Range Status  06/13/2023 41.0 39.0 - 52.0 % Final   MCHC  Date Value Ref Range Status  06/13/2023 33.9 30.0 - 36.0 g/dL Final   [facility]  Date Value Ref Range Status  06/13/2023 30.7 26.0 - 34.0 pg Final   MCV  Date Value Ref Range Status  06/13/2023 90.5 80.0 - 100.0 fL Final   No results found for: "PLTCOUNTKUC", "LABPLAT", "POCPLA" RDW  Date Value Ref Range Status  06/13/2023 14.5 11.5 - 15.5 % Final         Passed - CMP within normal limits and completed in the last 12 months    Albumin  Date Value Ref Range Status  06/13/2023 3.8 3.5 - 5.0 g/dL Final   Alkaline Phosphatase  Date Value Ref Range Status  06/13/2023 72 38 - 126 U/L Final   ALT  Date Value Ref Range Status  06/13/2023 19 0 - 44 U/L Final   AST  Date  Value Ref Range Status  06/13/2023 19 15 - 41 U/L Final   BUN  Date Value Ref Range Status  06/13/2023 16 6 - 20 mg/dL Final   Calcium  Date Value Ref Range Status  06/13/2023 9.0 8.9 - 10.3 mg/dL Final   Calcium, Ion  Date Value Ref Range Status  08/16/2022 1.07 (L) 1.15 - 1.40 mmol/L Final   CO2  Date Value Ref Range Status  06/13/2023 22 22 - 32 mmol/L Final   Bicarbonate  Date Value Ref Range Status  11/24/2022 25.4 20.0 - 28.0 mmol/L Final   TCO2  Date Value Ref Range Status  08/16/2022 24 22 - 32 mmol/L Final   Creatinine, Ser  Date Value Ref Range Status  06/13/2023 1.17 0.61 - 1.24 mg/dL Final   Glucose, Bld  Date Value Ref Range Status  06/13/2023 132 (H) 70 - 99 mg/dL  Final    Comment:    Glucose reference range applies only to samples taken after fasting for at least 8 hours.   Glucose-Capillary  Date Value Ref Range Status  03/25/2023 120 (H) 70 - 99 mg/dL Final    Comment:    Glucose reference range applies only to samples taken after fasting for at least 8 hours.   Potassium  Date Value Ref Range Status  06/13/2023 3.7 3.5 - 5.1 mmol/L Final   Sodium  Date Value Ref Range Status  06/13/2023 139 135 - 145 mmol/L Final   Total Bilirubin  Date Value Ref Range Status  06/13/2023 0.3 0.3 - 1.2 mg/dL Final   Bilirubin, Direct  Date Value Ref Range Status  12/21/2020 0.2 0.0 - 0.2 mg/dL Final   Indirect Bilirubin  Date Value Ref Range Status  12/21/2020 0.5 0.3 - 0.9 mg/dL Final    Comment:    Performed at [facility], [address] [place], [name] [address]   Protein, ur  Date Value Ref Range Status  08/17/2022 NEGATIVE NEGATIVE mg/dL Final   Total Protein  Date Value Ref Range Status  06/13/2023 7.4 6.5 - 8.1 g/dL Final   GFR calc Af Amer  Date Value Ref Range Status  08/21/2020 >60 >60 mL/min Final   GFR, Estimated  Date Value Ref Range Status  06/13/2023 >60 >60 mL/min Final    Comment:    (NOTE) Calculated using the CKD-EPI Creatinine Equation (2021)           Baclofen 5 MG TABS 20 tablet 0    Sig: Take 1 tablet (5 mg total) by mouth 3 (three) times daily as needed.     Analgesics:  Muscle Relaxants - baclofen Passed - 08/25/2023  3:14 PM      Passed - Cr in normal range and within 180 days    Creatinine, Ser  Date Value Ref Range Status  06/13/2023 1.17 0.61 - 1.24 mg/dL Final         Passed - eGFR is 30 or above and within 180 days    GFR calc Af Amer  Date Value Ref Range Status  08/21/2020 >60 >60 mL/min Final   GFR, Estimated  Date Value Ref Range Status  06/13/2023 >60 >60 mL/min Final    Comment:    (NOTE) Calculated using the CKD-EPI Creatinine Equation (2021)          Passed -  Valid encounter within last 6 months    Recent Outpatient Visits           4 weeks ago Chronic pain  syndrome   [facility], Lauris Poag, MD               carvedilol (COREG) 12.5 MG tablet 60 tablet 0    Sig: Take 1 tablet (12.5 mg total) by mouth 2 (two) times daily with a meal.     Cardiovascular: Beta Blockers 3 Passed - 08/25/2023  3:14 PM      Passed - Cr in normal range and within 360 days    Creatinine, Ser  Date Value Ref Range Status  06/13/2023 1.17 0.61 - 1.24 mg/dL Final         Passed - AST in normal range and within 360 days    AST  Date Value Ref Range Status  06/13/2023 19 15 - 41 U/L Final         Passed - ALT in normal range and within 360 days    ALT  Date Value Ref Range Status  06/13/2023 19 0 - 44 U/L Final         Passed - Last BP in normal range    BP Readings from Last 1 Encounters:  07/28/23 122/83         Passed - Last Heart Rate in normal range    Pulse Readings from Last 1 Encounters:  07/28/23 71         Passed - Valid encounter within last 6 months    Recent Outpatient Visits           4 weeks ago Chronic pain syndrome   [place] [facility], Lauris Poag, MD               escitalopram (LEXAPRO) 5 MG tablet 30 tablet 0    Sig: Take 1 tablet (5 mg total) by mouth daily.     Psychiatry:  Antidepressants - SSRI Passed - 08/25/2023  3:14 PM      Passed - Completed PHQ-2 or PHQ-9 in the last 360 days      Passed - Valid encounter within last 6 months    Recent Outpatient Visits           4 weeks ago Chronic pain syndrome   [facility], Lauris Poag, MD               furosemide (LASIX) 20 MG tablet 30 tablet 0    Sig: Take 1 tablet (20 mg total) by mouth daily.     Cardiovascular:  Diuretics - Loop Passed - 08/25/2023  3:14 PM      Passed - K in normal range and within 180 days    Potassium  Date Value Ref Range Status   06/13/2023 3.7 3.5 - 5.1 mmol/L Final         Passed - Ca in normal range and within 180 days    Calcium  Date Value Ref Range Status  06/13/2023 9.0 8.9 - 10.3 mg/dL Final   Calcium, Ion  Date Value Ref Range Status  08/16/2022 1.07 (L) 1.15 - 1.40 mmol/L Final         Passed - Na in normal range and within 180 days    Sodium  Date Value Ref Range Status  06/13/2023 139 135 - 145 mmol/L Final         Passed - Cr in normal range and within 180 days    Creatinine, Ser  Date Value Ref Range Status  06/13/2023 1.17 0.61 -  1.24 mg/dL Final         Passed - Cl in normal range and within 180 days    Chloride  Date Value Ref Range Status  06/13/2023 104 98 - 111 mmol/L Final         Passed - Mg Level in normal range and within 180 days    Magnesium  Date Value Ref Range Status  06/05/2023 1.9 1.7 - 2.4 mg/dL Final    Comment:    Performed at [facility], [address] [place], [name] [address]         Passed - Last BP in normal range    BP Readings from Last 1 Encounters:  07/28/23 122/83         Passed - Valid encounter within last 6 months    Recent Outpatient Visits           4 weeks ago Chronic pain syndrome   [facility], Lauris Poag, MD               gabapentin (NEURONTIN) 300 MG capsule 270 capsule 0    Sig: Take 3 capsules (900 mg total) by mouth 3 (three) times daily.     Neurology: Anticonvulsants - gabapentin Passed - 08/25/2023  3:14 PM      Passed - Cr in normal range and within 360 days    Creatinine, Ser  Date Value Ref Range Status  06/13/2023 1.17 0.61 - 1.24 mg/dL Final         Passed - Completed PHQ-2 or PHQ-9 in the last 360 days      Passed - Valid encounter within last 12 months    Recent Outpatient Visits           4 weeks ago Chronic pain syndrome   [facility], Lauris Poag, MD               oxyCODONE (OXY IR/ROXICODONE) 5 MG immediate  release tablet 20 tablet 0    Sig: Take 1 tablet (5 mg total) by mouth every 4 (four) hours as needed for severe pain.     Not Delegated - Analgesics:  Opioid Agonists Failed - 08/25/2023  3:14 PM      Failed - This refill cannot be delegated      Passed - Urine Drug Screen completed in last 360 days      Passed - Valid encounter within last 3 months    Recent Outpatient Visits           4 weeks ago Chronic pain syndrome   [facility], Lauris Poag, MD               oxyCODONE (OXYCONTIN) 10 mg 12 hr tablet 20 tablet 0    Sig: Take 2 tablets (20 mg total) by mouth every 12 (twelve) hours.     Not Delegated - Analgesics:  Opioid Agonists Failed - 08/25/2023  3:14 PM      Failed - This refill cannot be delegated      Passed - Urine Drug Screen completed in last 360 days      Passed - Valid encounter within last 3 months    Recent Outpatient Visits           4 weeks ago Chronic pain syndrome   [facility], MD  Requested Prescriptions  Pending Prescriptions Disp Refills   albuterol (VENTOLIN HFA) 108 (90 Base) MCG/ACT inhaler 18 g 0    Sig: Inhale 2 puffs into the lungs every 6 (six) hours as needed for wheezing or shortness of breath.     Pulmonology:  Beta Agonists 2 Passed - 08/25/2023  3:14 PM      Passed - Last BP in normal range    BP Readings from Last 1 Encounters:  07/28/23 122/83         Passed - Last Heart Rate in normal range    Pulse Readings from Last 1 Encounters:  07/28/23 71         Passed - Valid encounter within last 12 months    Recent Outpatient Visits           4 weeks ago Chronic pain syndrome   [facility], Lauris Poag, MD               amLODipine (NORVASC) 10 MG tablet 30 tablet 0    Sig: Take 1 tablet (10 mg total) by mouth daily.     Cardiovascular: Calcium Channel Blockers 2 Passed - 08/25/2023   3:14 PM      Passed - Last BP in normal range    BP Readings from Last 1 Encounters:  07/28/23 122/83         Passed - Last Heart Rate in normal range    Pulse Readings from Last 1 Encounters:  07/28/23 71         Passed - Valid encounter within last 6 months    Recent Outpatient Visits           4 weeks ago Chronic pain syndrome   [facility], Lauris Poag, MD               ARIPiprazole (ABILIFY) 5 MG tablet 30 tablet 0    Sig: Take 1 tablet (5 mg total) by mouth daily.     Not Delegated - Psychiatry:  Antipsychotics - Second Generation (Atypical) - aripiprazole Failed - 08/25/2023  3:14 PM      Failed - This refill cannot be delegated      Failed - TSH in normal range and within 360 days    TSH  Date Value Ref Range Status  08/17/2022 1.974 0.350 - 4.500 uIU/mL Final    Comment:    Performed by a 3rd Generation assay with a functional sensitivity of <=0.01 uIU/mL. Performed at [facility], [address], [place], [name] [address]          Failed - Lipid Panel in normal range within the last 12 months    Cholesterol  Date Value Ref Range Status  03/04/2022 109 0 - 200 mg/dL Final   LDL Cholesterol  Date Value Ref Range Status  03/04/2022 45 0 - 99 mg/dL Final    Comment:           Total Cholesterol/HDL:CHD Risk Coronary Heart Disease Risk Table                     Men   Women  1/2 Average Risk   3.4   3.3  Average Risk       5.0   4.4  2 X Average Risk   9.6   7.1  3 X Average Risk  23.4   11.0  Use the calculated Patient Ratio above and the CHD Risk Table to determine the patient's CHD Risk.        ATP III CLASSIFICATION (LDL):  <100     mg/dL   Optimal  829-562  mg/dL   Near or Above                    Optimal  130-159  mg/dL   Borderline  130-865  mg/dL   High  >784     mg/dL   Very High Performed at [facility], [address], [place], [name] [address]    HDL  Date Value  Ref Range Status  03/04/2022 55 >40 mg/dL Final   Triglycerides  Date Value Ref Range Status  07/14/2022 52 <150 mg/dL Final    Comment:    Performed at [facility], [address] [place], [name] [address]         Passed - Completed PHQ-2 or PHQ-9 in the last 360 days      Passed - Last BP in normal range    BP Readings from Last 1 Encounters:  07/28/23 122/83         Passed - Last Heart Rate in normal range    Pulse Readings from Last 1 Encounters:  07/28/23 71         Passed - Valid encounter within last 6 months    Recent Outpatient Visits           4 weeks ago Chronic pain syndrome   [place] [facility], Lauris Poag, MD              Passed - CBC within normal limits and completed in the last 12 months    WBC  Date Value Ref Range Status  06/13/2023 6.6 4.0 - 10.5 K/uL Final   RBC  Date Value Ref Range Status  06/13/2023 4.53 4.22 - 5.81 MIL/uL Final   Hemoglobin  Date Value Ref Range Status  06/13/2023 13.9 13.0 - 17.0 g/dL Final   Total hemoglobin  Date Value Ref Range Status  07/16/2022 12.3 12.0 - 16.0 g/dL Final   HCT  Date Value Ref Range Status  06/13/2023 41.0 39.0 - 52.0 % Final   MCHC  Date Value Ref Range Status  06/13/2023 33.9 30.0 - 36.0 g/dL Final   [facility]  Date Value Ref Range Status  06/13/2023 30.7 26.0 - 34.0 pg Final   MCV  Date Value Ref Range Status  06/13/2023 90.5 80.0 - 100.0 fL Final   No results found for: "PLTCOUNTKUC", "LABPLAT", "POCPLA" RDW  Date Value Ref Range Status  06/13/2023 14.5 11.5 - 15.5 % Final         Passed - CMP within normal limits and completed in the last 12 months    Albumin  Date Value Ref Range Status  06/13/2023 3.8 3.5 - 5.0 g/dL Final   Alkaline Phosphatase  Date Value Ref Range Status  06/13/2023 72 38 - 126 U/L Final   ALT  Date Value Ref Range Status  06/13/2023 19 0 - 44 U/L Final   AST  Date Value Ref Range Status   06/13/2023 19 15 - 41 U/L Final   BUN  Date Value Ref Range Status  06/13/2023 16 6 - 20 mg/dL Final   Calcium  Date Value Ref Range Status  06/13/2023 9.0 8.9 - 10.3 mg/dL Final   Calcium, Ion  Date Value Ref  Range Status  08/16/2022 1.07 (L) 1.15 - 1.40 mmol/L Final   CO2  Date Value Ref Range Status  06/13/2023 22 22 - 32 mmol/L Final   Bicarbonate  Date Value Ref Range Status  11/24/2022 25.4 20.0 - 28.0 mmol/L Final   TCO2  Date Value Ref Range Status  08/16/2022 24 22 - 32 mmol/L Final   Creatinine, Ser  Date Value Ref Range Status  06/13/2023 1.17 0.61 - 1.24 mg/dL Final   Glucose, Bld  Date Value Ref Range Status  06/13/2023 132 (H) 70 - 99 mg/dL Final    Comment:    Glucose reference range applies only to samples taken after fasting for at least 8 hours.   Glucose-Capillary  Date Value Ref Range Status  03/25/2023 120 (H) 70 - 99 mg/dL Final    Comment:    Glucose reference range applies only to samples taken after fasting for at least 8 hours.   Potassium  Date Value Ref Range Status  06/13/2023 3.7 3.5 - 5.1 mmol/L Final   Sodium  Date Value Ref Range Status  06/13/2023 139 135 - 145 mmol/L Final   Total Bilirubin  Date Value Ref Range Status  06/13/2023 0.3 0.3 - 1.2 mg/dL Final   Bilirubin, Direct  Date Value Ref Range Status  12/21/2020 0.2 0.0 - 0.2 mg/dL Final   Indirect Bilirubin  Date Value Ref Range Status  12/21/2020 0.5 0.3 - 0.9 mg/dL Final    Comment:    Performed at [facility], [address] [place], [name] [address]   Protein, ur  Date Value Ref Range Status  08/17/2022 NEGATIVE NEGATIVE mg/dL Final   Total Protein  Date Value Ref Range Status  06/13/2023 7.4 6.5 - 8.1 g/dL Final   GFR calc Af Amer  Date Value Ref Range Status  08/21/2020 >60 >60 mL/min Final   GFR, Estimated  Date Value Ref Range Status  06/13/2023 >60 >60 mL/min Final    Comment:    (NOTE) Calculated using the CKD-EPI Creatinine  Equation (2021)           Baclofen 5 MG TABS 20 tablet 0    Sig: Take 1 tablet (5 mg total) by mouth 3 (three) times daily as needed.     Analgesics:  Muscle Relaxants - baclofen Passed - 08/25/2023  3:14 PM      Passed - Cr in normal range and within 180 days    Creatinine, Ser  Date Value Ref Range Status  06/13/2023 1.17 0.61 - 1.24 mg/dL Final         Passed - eGFR is 30 or above and within 180 days    GFR calc Af Amer  Date Value Ref Range Status  08/21/2020 >60 >60 mL/min Final   GFR, Estimated  Date Value Ref Range Status  06/13/2023 >60 >60 mL/min Final    Comment:    (NOTE) Calculated using the CKD-EPI Creatinine Equation (2021)          Passed - Valid encounter within last 6 months    Recent Outpatient Visits           4 weeks ago Chronic pain syndrome   [facility], Lauris Poag, MD               carvedilol (COREG) 12.5 MG tablet 60 tablet 0    Sig: Take 1 tablet (12.5 mg total) by mouth 2 (two) times daily with a meal.  Cardiovascular: Beta Blockers 3 Passed - 08/25/2023  3:14 PM      Passed - Cr in normal range and within 360 days    Creatinine, Ser  Date Value Ref Range Status  06/13/2023 1.17 0.61 - 1.24 mg/dL Final         Passed - AST in normal range and within 360 days    AST  Date Value Ref Range Status  06/13/2023 19 15 - 41 U/L Final         Passed - ALT in normal range and within 360 days    ALT  Date Value Ref Range Status  06/13/2023 19 0 - 44 U/L Final         Passed - Last BP in normal range    BP Readings from Last 1 Encounters:  07/28/23 122/83         Passed - Last Heart Rate in normal range    Pulse Readings from Last 1 Encounters:  07/28/23 71         Passed - Valid encounter within last 6 months    Recent Outpatient Visits           4 weeks ago Chronic pain syndrome   [place] [facility], Lauris Poag, MD               escitalopram  (LEXAPRO) 5 MG tablet 30 tablet 0    Sig: Take 1 tablet (5 mg total) by mouth daily.     Psychiatry:  Antidepressants - SSRI Passed - 08/25/2023  3:14 PM      Passed - Completed PHQ-2 or PHQ-9 in the last 360 days      Passed - Valid encounter within last 6 months    Recent Outpatient Visits           4 weeks ago Chronic pain syndrome   [facility] [facility], Lauris Poag, MD               furosemide (LASIX) 20 MG tablet 30 tablet 0    Sig: Take 1 tablet (20 mg total) by mouth daily.     Cardiovascular:  Diuretics - Loop Passed - 08/25/2023  3:14 PM      Passed - K in normal range and within 180 days    Potassium  Date Value Ref Range Status  06/13/2023 3.7 3.5 - 5.1 mmol/L Final         Passed - Ca in normal range and within 180 days    Calcium  Date Value Ref Range Status  06/13/2023 9.0 8.9 - 10.3 mg/dL Final   Calcium, Ion  Date Value Ref Range Status  08/16/2022 1.07 (L) 1.15 - 1.40 mmol/L Final         Passed - Na in normal range and within 180 days    Sodium  Date Value Ref Range Status  06/13/2023 139 135 - 145 mmol/L Final         Passed - Cr in normal range and within 180 days    Creatinine, Ser  Date Value Ref Range Status  06/13/2023 1.17 0.61 - 1.24 mg/dL Final         Passed - Cl in normal range and within 180 days    Chloride  Date Value Ref Range Status  06/13/2023 104 98 - 111 mmol/L Final         Passed - Mg Level in normal range and within 180  days    Magnesium  Date Value Ref Range Status  06/05/2023 1.9 1.7 - 2.4 mg/dL Final    Comment:    Performed at [facility], [address], [place], [name] [address]         Passed - Last BP in normal range    BP Readings from Last 1 Encounters:  07/28/23 122/83         Passed - Valid encounter within last 6 months    Recent Outpatient Visits           4 weeks ago Chronic pain syndrome   [place] [facility], Lauris Poag, MD                gabapentin (NEURONTIN) 300 MG capsule 270 capsule 0    Sig: Take 3 capsules (900 mg total) by mouth 3 (three) times daily.     Neurology: Anticonvulsants - gabapentin Passed - 08/25/2023  3:14 PM      Passed - Cr in normal range and within 360 days    Creatinine, Ser  Date Value Ref Range Status  06/13/2023 1.17 0.61 - 1.24 mg/dL Final         Passed - Completed PHQ-2 or PHQ-9 in the last 360 days      Passed - Valid encounter within last 12 months    Recent Outpatient Visits           4 weeks ago Chronic pain syndrome   [place] [facility], Lauris Poag, MD               oxyCODONE (OXY IR/ROXICODONE) 5 MG immediate release tablet 20 tablet 0    Sig: Take 1 tablet (5 mg total) by mouth every 4 (four) hours as needed for severe pain.     Not Delegated - Analgesics:  Opioid Agonists Failed - 08/25/2023  3:14 PM      Failed - This refill cannot be delegated      Passed - Urine Drug Screen completed in last 360 days      Passed - Valid encounter within last 3 months    Recent Outpatient Visits           4 weeks ago Chronic pain syndrome   [place] [facility] [facility], Lauris Poag, MD               oxyCODONE (OXYCONTIN) 10 mg 12 hr tablet 20 tablet 0    Sig: Take 2 tablets (20 mg total) by mouth every 12 (twelve) hours.     Not Delegated - Analgesics:  Opioid Agonists Failed - 08/25/2023  3:14 PM      Failed - This refill cannot be delegated      Passed - Urine Drug Screen completed in last 360 days      Passed - Valid encounter within last 3 months    Recent Outpatient Visits           4 weeks ago Chronic pain syndrome    Primary Care at [facility], MD

## 2023-09-02 ENCOUNTER — Other Ambulatory Visit: Payer: Self-pay | Admitting: Family Medicine

## 2023-09-02 ENCOUNTER — Telehealth: Payer: Self-pay | Admitting: Family Medicine

## 2023-09-02 MED ORDER — GABAPENTIN 300 MG PO CAPS: 900.0000 mg | ORAL_CAPSULE | Freq: Three times a day (TID) | ORAL | 0 refills | Status: DC

## 2023-09-02 MED ORDER — CARVEDILOL 12.5 MG PO TABS: 12.5000 mg | ORAL_TABLET | Freq: Two times a day (BID) | ORAL | 0 refills | Status: DC

## 2023-09-02 MED ORDER — ALBUTEROL SULFATE HFA 108 (90 BASE) MCG/ACT IN AERS: 2.0000 | INHALATION_SPRAY | Freq: Four times a day (QID) | RESPIRATORY_TRACT | 0 refills | Status: DC | PRN

## 2023-09-02 MED ORDER — ESCITALOPRAM OXALATE 5 MG PO TABS: 5.0000 mg | ORAL_TABLET | Freq: Every day | ORAL | 0 refills | Status: DC

## 2023-09-02 MED ORDER — AMLODIPINE BESYLATE 10 MG PO TABS: 10.0000 mg | ORAL_TABLET | Freq: Every day | ORAL | 0 refills | Status: DC

## 2023-09-02 MED ORDER — FUROSEMIDE 20 MG PO TABS: 20.0000 mg | ORAL_TABLET | Freq: Every day | ORAL | 0 refills | Status: DC

## 2023-09-02 MED ORDER — ARIPIPRAZOLE 5 MG PO TABS: 5.0000 mg | ORAL_TABLET | Freq: Every day | ORAL | 0 refills | Status: DC

## 2023-09-02 MED ORDER — BACLOFEN 5 MG PO TABS: 5.0000 mg | ORAL_TABLET | Freq: Three times a day (TID) | ORAL | 0 refills | Status: DC | PRN

## 2023-09-02 NOTE — Telephone Encounter (Signed)
Per our discussion , patient is going to a inpatient treatment center and they are requesting a 30 day supply of all medication sent to [facility]. Please advise if you need my assistance.  Thank you,  Anjelica

## 2023-09-03 ENCOUNTER — Other Ambulatory Visit (HOSPITAL_COMMUNITY): Payer: Self-pay

## 2023-09-09 ENCOUNTER — Telehealth: Payer: Self-pay

## 2023-09-09 NOTE — Telephone Encounter (Signed)
[name], [facility] from [facility] calling to figure out if Dr. Andrey Campanile last prescribed medications, where they were sent to and what date. He is also needing to know when pt last picked up medications. Advised him Dr. Andrey Campanile sent rxs on 09/02/23 and sent to [facility] and their phone # was given. No medications were named. No further assistance noted.

## 2023-10-02 ENCOUNTER — Other Ambulatory Visit: Payer: Self-pay | Admitting: Family Medicine

## 2023-10-22 ENCOUNTER — Encounter (HOSPITAL_COMMUNITY): Payer: Self-pay

## 2023-10-22 ENCOUNTER — Other Ambulatory Visit: Payer: Self-pay

## 2023-10-22 ENCOUNTER — Emergency Department (HOSPITAL_COMMUNITY)
Admission: EM | Admit: 2023-10-22 | Discharge: 2023-10-24 | Disposition: A | Discharge status: Home / Self Care | Payer: MEDICAID | Attending: Emergency Medicine | Admitting: Emergency Medicine

## 2023-10-22 DIAGNOSIS — Z91010 Allergy to peanuts: Secondary | ICD-10-CM | POA: Insufficient documentation

## 2023-10-22 DIAGNOSIS — J449 Chronic obstructive pulmonary disease, unspecified: Secondary | ICD-10-CM | POA: Diagnosis not present

## 2023-10-22 DIAGNOSIS — F1420 Cocaine dependence, uncomplicated: Secondary | ICD-10-CM | POA: Insufficient documentation

## 2023-10-22 DIAGNOSIS — M79605 Pain in left leg: Secondary | ICD-10-CM | POA: Insufficient documentation

## 2023-10-22 DIAGNOSIS — F17210 Nicotine dependence, cigarettes, uncomplicated: Secondary | ICD-10-CM | POA: Insufficient documentation

## 2023-10-22 DIAGNOSIS — I110 Hypertensive heart disease with heart failure: Secondary | ICD-10-CM | POA: Diagnosis not present

## 2023-10-22 DIAGNOSIS — R45851 Suicidal ideations: Secondary | ICD-10-CM | POA: Diagnosis not present

## 2023-10-22 DIAGNOSIS — F1914 Other psychoactive substance abuse with psychoactive substance-induced mood disorder: Secondary | ICD-10-CM | POA: Insufficient documentation

## 2023-10-22 DIAGNOSIS — J45909 Unspecified asthma, uncomplicated: Secondary | ICD-10-CM | POA: Diagnosis not present

## 2023-10-22 DIAGNOSIS — I509 Heart failure, unspecified: Secondary | ICD-10-CM | POA: Insufficient documentation

## 2023-10-22 DIAGNOSIS — F1490 Cocaine use, unspecified, uncomplicated: Secondary | ICD-10-CM | POA: Diagnosis present

## 2023-10-22 DIAGNOSIS — F1994 Other psychoactive substance use, unspecified with psychoactive substance-induced mood disorder: Secondary | ICD-10-CM | POA: Diagnosis not present

## 2023-10-22 DIAGNOSIS — E119 Type 2 diabetes mellitus without complications: Secondary | ICD-10-CM | POA: Diagnosis not present

## 2023-10-22 LAB — CBC
HCT: 48.3 % (ref 39.0–52.0)
Hemoglobin: 16.6 g/dL (ref 13.0–17.0)
MCH: 31.6 pg (ref 26.0–34.0)
MCHC: 34.4 g/dL (ref 30.0–36.0)
MCV: 92 fL (ref 80.0–100.0)
Platelets: 235 10*3/uL (ref 150–400)
RBC: 5.25 MIL/uL (ref 4.22–5.81)
RDW: 14.4 % (ref 11.5–15.5)
WBC: 6.6 10*3/uL (ref 4.0–10.5)
nRBC: 0 % (ref 0.0–0.2)

## 2023-10-22 LAB — COMPREHENSIVE METABOLIC PANEL
ALT: 26 U/L (ref 0–44)
AST: 27 U/L (ref 15–41)
Albumin: 3.8 g/dL (ref 3.5–5.0)
Alkaline Phosphatase: 68 U/L (ref 38–126)
Anion gap: 11 (ref 5–15)
BUN: 24 mg/dL — ABNORMAL HIGH (ref 6–20)
CO2: 22 mmol/L (ref 22–32)
Calcium: 9 mg/dL (ref 8.9–10.3)
Chloride: 103 mmol/L (ref 98–111)
Creatinine, Ser: 0.98 mg/dL (ref 0.61–1.24)
GFR, Estimated: >60 mL/min (ref >60)
Glucose, Bld: 183 mg/dL — ABNORMAL HIGH (ref 70–99)
Potassium: 3.6 mmol/L (ref 3.5–5.1)
Sodium: 136 mmol/L (ref 135–145)
Total Bilirubin: 1.5 mg/dL — ABNORMAL HIGH (ref <1.2)
Total Protein: 7.1 g/dL (ref 6.5–8.1)

## 2023-10-22 LAB — ETHANOL: Alcohol, Ethyl (B): <10 mg/dL (ref <10)

## 2023-10-22 LAB — RAPID URINE DRUG SCREEN, HOSP PERFORMED
Amphetamines: NOT DETECTED
Barbiturates: NOT DETECTED
Benzodiazepines: NOT DETECTED
Cocaine: POSITIVE — AB
Opiates: NOT DETECTED
Tetrahydrocannabinol: NOT DETECTED

## 2023-10-22 LAB — ACETAMINOPHEN LEVEL: Acetaminophen (Tylenol), Serum: <10 ug/mL — ABNORMAL LOW (ref 10–30)

## 2023-10-22 LAB — SALICYLATE LEVEL: Salicylate Lvl: <7 mg/dL — ABNORMAL LOW (ref 7.0–30.0)

## 2023-10-22 MED ORDER — DIPHENHYDRAMINE HCL 50 MG/ML IJ SOLN: 25.0000 mg | Freq: Four times a day (QID) | INTRAMUSCULAR | Status: DC | PRN

## 2023-10-22 MED ORDER — DIPHENHYDRAMINE HCL 25 MG PO CAPS
25.0000 mg | ORAL_CAPSULE | Freq: Four times a day (QID) | ORAL | Status: DC | PRN
Start: 2023-10-22 — End: 2023-10-24

## 2023-10-22 MED ORDER — OLANZAPINE 5 MG PO TBDP: 10.0000 mg | ORAL_TABLET | Freq: Four times a day (QID) | ORAL | Status: DC | PRN

## 2023-10-22 MED ORDER — OLANZAPINE 10 MG IM SOLR: 10.0000 mg | Freq: Once | INTRAMUSCULAR | Status: DC | PRN

## 2023-10-22 NOTE — ED Notes (Signed)
TTS in process 

## 2023-10-22 NOTE — Consult Note (Addendum)
[facility] Consult Note  Patient Name: [name] MRN: [id number] DOB: Feb 15, 1966 DATE OF Consult: 10/22/2023  PRIMARY PSYCHIATRIC DIAGNOSES  1.  Substance Induced Mood Disorder 2.  Suicidal Ideations 3.  Cocaine Dependence, Severe    RECOMMENDATIONS  Recommendations: Medication recommendations:  -- Plan to hold all psychotropic scheduled medications at this time given prolonged QTC.  -- May restart Abilify 5mg  po daily for mood, psychosis and Lexapro 5mg  po daily for depression and anxiety if QTC below 500 on repeat EKG -- Will order PRN Zyprexa 10mg  PO/IM Q6H PRN for acute agitation with Benadryl 25mg  PO/IM Q6H PRN for acute agitation  Non-Medication/therapeutic recommendations:  -- Repeat EKG given initial QTC prolongation noted. Monitor QTC. Avoid medications with prolonging effect.  -- Pursue inpatient psychiatric admission- VOL status -- Finger foods only, no utensils -- Suicide Precautions   Is inpatient psychiatric hospitalization recommended for this patient?  -- Yes, SI with plan to kill himself. VOL basis. History of malingering and manipulative behavior. If patient changes his mind regarding admission, before placing on IVC and/or discharging, would recommend re-consulting psychiatry for evaluation of risk.   Communication: Treatment team members (and family members if applicable) who were involved in treatment/care discussions and planning, and with whom we spoke or engaged with via secure text/chat, include the following: Dr. Pecola Leisure and ED primary team  Thank you for involving [place] in the care of this patient. If you have any additional questions or concerns, please call [phone] and ask for me or the provider on-call.  TELEPSYCHIATRY ATTESTATION & CONSENT  As the provider for this telehealth consult, I attest that I verified the patient's identity using two separate identifiers, introduced myself to the patient, provided my credentials, disclosed my location,  and performed this encounter via a HIPAA-compliant, real-time, face-to-face, two-way, interactive audio and video platform and with the full consent and agreement of the patient (or guardian as applicable.)  Patient physical location: ED in [facility]. Telehealth provider physical location: home office in state of [place].  Video start time: 2154 [facility] Time) Video end time: 2216 (Central Time)  IDENTIFYING DATA  [name] is a 57 y.o. year-old male for whom a psychiatric consultation has been ordered by the primary provider. The patient was identified using two separate identifiers.  CHIEF COMPLAINT/REASON FOR CONSULT  Suicidal Ideations   HISTORY OF PRESENT ILLNESS (HPI)  The patient is a 57yo male who presented to the emergency department, via EMS from home, with concern for suicidal ideations with plan by kitchen knife. Per documentation, patient was discharged from [facility] yesterday and seen there for same complaint; states he was discharged too soon. Also endorses smoking crack cocaine yesterday.   UDS + cocaine, BAL negative QT/ QTC 440/517  Patient evaluated 1:1. He is calm and cooperative, pleasant, polite. No aggressive behaviors or agitation observed. Patient states he was recently admitted to [facility] for worsening depression and suicidal ideations. He believes he was admitted for 7-14 days and was just discharged yesterday but patient feels he left too soon due to cravings to use crack cocaine. Patient states once he arrived back home to his apartment yesterday he went and bought crack cocaine and relapsed. Patient is remorseful for his behaviors and regrets leaving [facility]. Now that he has relapsed he is experiencing a severe depressive episode and feels suicidal with a plan to kill himself by stabbing himself with a kitchen knife. Patient reports having the knife in his hand prior to calling  crisis hotline. Patient shares his mother passed away from  drug use and states "I know better". Patient did not sleep last night and complains that his appetite has been poor even at the inpatient psychiatric unit. No homicidal ideations. He endorses command type auditory hallucinations telling him to kill himself and telling him he is "worthless". Patient feels helpless, hopeless, worthless. Admits the staff at [facility] prescribed him psychotropic medications, believes he was given Remeron and Abilify but complains they didn't work and "messed my sleep up". Patient does not feel safe leaving the hospital at this time and wishes for a re-admission for inpatient psychiatric treatment.   Patient does not appear to be responding to internal stimuli, no thought blocking noted. No delusions apparent otherwise. No symptoms indicative of mania. Admits to using only crack cocaine yesterday, denies additional drug use. No alcohol.     PAST PSYCHIATRIC HISTORY  Multiple inpatient psychiatric admissions. Multiple ED visitations for complaints of suicidal ideations. Schizoaffective Disorder vs. Schizophrenia, Substance Induced Mood Disorder, Polysubstance Abuse (cocaine, alcohol), personality disorder, malingering.  Chronically non-compliant with outpatient treatment, medications Appears Abilify and Lexapro have been most commonly prescribed, per chart review.  Recently given Abilify and Remeron.  Patient reports past suicide attempts by overdose, none recently.    Otherwise as per HPI above.  PAST MEDICAL HISTORY  Past Medical History:  Diagnosis Date   Asthma    Bipolar 1 disorder (HCC)    CHF (congestive heart failure) (HCC)    COPD (chronic obstructive pulmonary disease) (HCC)    Depression    Diabetes mellitus without complication (HCC)    GERD (gastroesophageal reflux disease)    Homelessness    Hypertension    QT prolongation    Schizoaffective disorder (HCC)      HOME MEDICATIONS  PTA Medications  Medication Sig   furosemide (LASIX) 20 MG  tablet Take 1 tablet (20 mg total) by mouth daily.   ARIPiprazole (ABILIFY) 5 MG tablet Take 1 tablet (5 mg total) by mouth daily.   carvedilol (COREG) 12.5 MG tablet Take 1 tablet (12.5 mg total) by mouth 2 (two) times daily with a meal.   Baclofen 5 MG TABS Take 1 tablet (5 mg total) by mouth 3 (three) times daily as needed.   oxyCODONE (OXY IR/ROXICODONE) 5 MG immediate release tablet Take 1 tablet (5 mg total) by mouth every 4 (four) hours as needed for severe pain. (Patient not taking: Reported on 10/22/2023)   oxyCODONE (OXYCONTIN) 10 mg 12 hr tablet Take 2 tablets (20 mg total) by mouth every 12 (twelve) hours. (Patient not taking: Reported on 10/22/2023)   gabapentin (NEURONTIN) 300 MG capsule Take 3 capsules (900 mg total) by mouth 3 (three) times daily. (Patient not taking: Reported on 10/22/2023)   escitalopram (LEXAPRO) 5 MG tablet Take 1 tablet (5 mg total) by mouth daily. (Patient not taking: Reported on 10/22/2023)   albuterol (VENTOLIN HFA) 108 (90 Base) MCG/ACT inhaler Inhale 2 puffs into the lungs every 6 (six) hours as needed for wheezing or shortness of breath. (Patient not taking: Reported on 10/22/2023)     ALLERGIES  Allergies  Allergen Reactions   Acetaminophen Hives, Nausea And Vomiting, Rash and Anaphylaxis    PT states that if he takes this he is going to the hospital   Motrin [Ibuprofen] Anaphylaxis   Nsaids Anaphylaxis    PT states that if he takes this he is going to the hospital   Lyrica Cr [Pregabalin Er] Other (See Comments)  Dystonia Dysarthria    Peanut-Containing Drug Products Other (See Comments)    Pt denies allergy to peanut butter 03/02/2021    SOCIAL & SUBSTANCE USE HISTORY  Social History   Socioeconomic History   Marital status: Single    Spouse name: Not on file   Number of children: 4   Years of education: Not on file   Highest education level: Not on file  Occupational History   Occupation: N/A  Tobacco Use   Smoking status: Some Days     Types: Cigarettes   Smokeless tobacco: Never  Vaping Use   Vaping status: Never Used  Substance and Sexual Activity   Alcohol use: Yes   Drug use: Yes    Types: Cocaine   Sexual activity: Not Currently    Partners: Female    Birth control/protection: None  Other Topics Concern   Not on file  Social History Narrative   ** Merged History Encounter **       Social Determinants of Health   Financial Resource Strain: High Risk (07/24/2021)   Received from [facility]   Overall Financial Resource Strain (CARDIA)    Difficulty of Paying Living Expenses: Hard  Food Insecurity: Food Insecurity Present (11/24/2022)   Hunger Vital Sign    Worried About Running Out of Food in the Last Year: Often true    Ran Out of Food in the Last Year: Often true  Transportation Needs: Unmet Transportation Needs (12/07/2022)   PRAPARE - Transportation    Lack of Transportation (Medical): No    Lack of Transportation (Non-Medical): Yes  Physical Activity: Not on file  Stress: Stress Concern Present (07/24/2021)   Received from [facility]   [employer] [facility] - Occupational Stress Questionnaire    Feeling of Stress : To some extent  Social Connections: Unknown (04/05/2022)   Received from [facility], [facility]   Social Network    Social Network: Not on file   Social History   Tobacco Use  Smoking Status Some Days   Types: Cigarettes  Smokeless Tobacco Never   Social History   Substance and Sexual Activity  Alcohol Use Yes   Social History   Substance and Sexual Activity  Drug Use Yes   Types: Cocaine    Additional pertinent information: lives alone in an apartment. Crack cocaine use- chronic use History of alcohol abuse as well.   UDS + cocaine BAL negative   FAMILY HISTORY  Family History  Problem Relation Age of Onset   Drug abuse Mother    Suicidality Mother    Family Psychiatric History (if known):  Mother  died by suicide/ drug overdose    MENTAL STATUS EXAM (MSE)  Presentation  General Appearance:  Appropriate for Environment  Eye Contact: Good  Speech: Clear and Coherent  Speech Volume: Normal  Handedness:No data recorded  Mood and Affect  Mood: Dysphoric; Worthless  Affect: Appropriate   Thought Process  Thought Processes: Linear; Coherent; Goal Directed  Descriptions of Associations: Intact  Orientation: Full (Time, Place and Person)  Thought Content: Logical; Tangential  History of Schizophrenia/Schizoaffective disorder:No data recorded Duration of Psychotic Symptoms:No data recorded Hallucinations:Hallucinations: Auditory; Command Description of Command Hallucinations: voices telling him to kill himself; derogatory calling him worthless Description of Auditory Hallucinations: command type auditory hallucinations  Ideas of Reference: None  Suicidal Thoughts:Suicidal Thoughts: Yes, Active SI Active Intent and/or Plan: With Plan; With Means to Carry Out  Homicidal Thoughts:Homicidal Thoughts: No  Sensorium  Memory: Recent Good  Judgment: Poor  Insight: Poor   Executive Functions  Concentration: Fair  Attention Span: Fair  Recall: Fair  Fund of Knowledge: Fair  Language: Good   Psychomotor Activity  Psychomotor Activity:Psychomotor Activity: Normal  Assets  Assets: Communication Skills; Desire for Improvement; Housing   Sleep  Sleep:Sleep: Poor   VITALS  Blood pressure 104/78, pulse 86, temperature 99.1 F (37.3 C), temperature source Oral, resp. rate 17, height 5\' 9"  (1.753 m), weight 89.8 kg, SpO2 94%.  LABS  Admission on 10/22/2023  Component Date Value Ref Range Status   Sodium 10/22/2023 136  135 - 145 mmol/L Final   Potassium 10/22/2023 3.6  3.5 - 5.1 mmol/L Final   Chloride 10/22/2023 103  98 - 111 mmol/L Final   CO2 10/22/2023 22  22 - 32 mmol/L Final   Glucose, Bld 10/22/2023 183 (H)  70 - 99 mg/dL Final    Glucose reference range applies only to samples taken after fasting for at least 8 hours.   BUN 10/22/2023 24 (H)  6 - 20 mg/dL Final   Creatinine, Ser 10/22/2023 0.98  0.61 - 1.24 mg/dL Final   Calcium 11/91/4782 9.0  8.9 - 10.3 mg/dL Final   Total Protein 95/62/1308 7.1  6.5 - 8.1 g/dL Final   Albumin 65/78/4696 3.8  3.5 - 5.0 g/dL Final   AST 29/52/8413 27  15 - 41 U/L Final   ALT 10/22/2023 26  0 - 44 U/L Final   Alkaline Phosphatase 10/22/2023 68  38 - 126 U/L Final   Total Bilirubin 10/22/2023 1.5 (H)  <1.2 mg/dL Final   GFR, Estimated 10/22/2023 >60  >60 mL/min Final   Comment: (NOTE) Calculated using the CKD-EPI Creatinine Equation (2021)    Anion gap 10/22/2023 11  5 - 15 Final   Performed at [facility], [address], [place], [name] [address]   Alcohol, Ethyl (B) 10/22/2023 <10  <10 mg/dL Final   Comment: (NOTE) Lowest detectable limit for serum alcohol is 10 mg/dL.  For medical purposes only. Performed at [facility], [address], [place], [name] [address]    Salicylate Lvl 10/22/2023 <7.0 (L)  7.0 - 30.0 mg/dL Final   Performed at [facility], [address] [place], [name] [address]   Acetaminophen (Tylenol), Serum 10/22/2023 <10 (L)  10 - 30 ug/mL Final   Comment: (NOTE) Therapeutic concentrations vary significantly. A range of 10-30 ug/mL  may be an effective concentration for many patients. However, some  are best treated at concentrations outside of this range. Acetaminophen concentrations >150 ug/mL at 4 hours after ingestion  and >50 ug/mL at 12 hours after ingestion are often associated with  toxic reactions.  Performed at [facility], [address], [place], [name] [address]    WBC 10/22/2023 6.6  4.0 - 10.5 K/uL Final   RBC 10/22/2023 5.25  4.22 - 5.81 MIL/uL Final   Hemoglobin 10/22/2023 16.6  13.0 - 17.0 g/dL Final   HCT 25/95/6387 48.3  39.0 - 52.0 % Final   MCV 10/22/2023 92.0  80.0 - 100.0 fL Final   MCH  10/22/2023 31.6  26.0 - 34.0 pg Final   MCHC 10/22/2023 34.4  30.0 - 36.0 g/dL Final   RDW 56/43/3295 14.4  11.5 - 15.5 % Final   Platelets 10/22/2023 235  150 - 400 K/uL Final   nRBC 10/22/2023 0.0  0.0 - 0.2 % Final   Performed  at [facility], [address] [place], [name] [address]   Opiates 10/22/2023 NONE DETECTED  NONE DETECTED Final   Cocaine 10/22/2023 POSITIVE (A)  NONE DETECTED Final   Benzodiazepines 10/22/2023 NONE DETECTED  NONE DETECTED Final   Amphetamines 10/22/2023 NONE DETECTED  NONE DETECTED Final   Tetrahydrocannabinol 10/22/2023 NONE DETECTED  NONE DETECTED Final   Barbiturates 10/22/2023 NONE DETECTED  NONE DETECTED Final   Comment: (NOTE) DRUG SCREEN FOR MEDICAL PURPOSES ONLY.  IF CONFIRMATION IS NEEDED FOR ANY PURPOSE, NOTIFY LAB WITHIN 5 DAYS.  LOWEST DETECTABLE LIMITS FOR URINE DRUG SCREEN Drug Class                     Cutoff (ng/mL) Amphetamine and metabolites    1000 Barbiturate and metabolites    200 Benzodiazepine                 200 Opiates and metabolites        300 Cocaine and metabolites        300 THC                            50 Performed at [facility], [address] [place], [name] [address]     PSYCHIATRIC REVIEW OF SYSTEMS (ROS)  ROS: Notable for the following relevant positive findings: Review of Systems  Psychiatric/Behavioral:  Positive for depression, hallucinations, substance abuse and suicidal ideas. The patient has insomnia.     Additional findings:      Musculoskeletal: No abnormal movements observed      Gait & Station: Wheelchair/Walker- BKA      Pain Screening: Present - mild to moderate      Nutrition & Dental Concerns: Decrease in food intake and/or loss of appetite  RISK FORMULATION/ASSESSMENT  Is the patient experiencing any suicidal or homicidal ideations: Yes       Explain if yes: SI with plan to stab himself with kitchen knife  Protective factors considered for safety management: access to  care, desire for improvement, housing  Risk factors/concerns considered for safety management: CAH, recent relapse Prior attempt Family history of suicide Depression Substance abuse/dependence Physical illness/chronic pain Hopelessness Male gender Unmarried  Is there a safety management plan with the patient and treatment team to minimize risk factors and promote protective factors: Yes           Explain: Patient currently in the ED, will provide medication management, comfort measures, suicide precautions, and pursue inpatient psychiatric admission. Is crisis care placement or psychiatric hospitalization recommended: Yes     Based on my current evaluation and risk assessment, patient is determined at this time to be at:  High risk  *RISK ASSESSMENT Risk assessment is a dynamic process; it is possible that this patient's condition, and risk level, may change. This should be re-evaluated and managed over time as appropriate. Please re-consult psychiatric consult services if additional assistance is needed in terms of risk assessment and management. If your team decides to discharge this patient, please advise the patient how to best access emergency psychiatric services, or to call 911, if their condition worsens or they feel unsafe in any way.   Assunta Gambles, NP Telepsychiatry Consult Services

## 2023-10-22 NOTE — ED Provider Notes (Signed)
[facility] AT [facility] Provider Note   CSN: [id number] Arrival date & time: 10/22/23  1603     History  Chief Complaint  Patient presents with   Suicidal    [name] is a 57 y.o. male with heart failure, CAD, BL BKAs, COPD, hyperlipidemia, hyperlipidemia, polyneuropathy, schizoaffective/bipolar disorder, and aggressive behavior who presents with SI.   PER EMS: pt is from home with c/o plan to commit suicide via kitchen knife. He was discharged from [facility] yesterday, was seen there for same complaint and states he feels like he was discharged too soon. He reports he smoked crack yesterday as well. He also endorses pain in his left stump that started a few days ago, no trauma or falls, no h/o similar. Denies f/c, redness, swelling to the area. States he hasn't taken any prescribed medications since being discharged.     Past Medical History:  Diagnosis Date   Asthma    Bipolar 1 disorder (HCC)    CHF (congestive heart failure) (HCC)    COPD (chronic obstructive pulmonary disease) (HCC)    Depression    Diabetes mellitus without complication (HCC)    GERD (gastroesophageal reflux disease)    Homelessness    Hypertension    QT prolongation    Schizoaffective disorder (HCC)        Home Medications Prior to Admission medications   Medication Sig Start Date End Date Taking? Authorizing Provider  ARIPiprazole (ABILIFY) 5 MG tablet Take 1 tablet (5 mg total) by mouth daily. 09/02/23 10/22/23 Yes Georganna Skeans, MD  Baclofen 5 MG TABS Take 1 tablet (5 mg total) by mouth 3 (three) times daily as needed. 09/02/23  Yes Georganna Skeans, MD  carvedilol (COREG) 12.5 MG tablet Take 1 tablet (12.5 mg total) by mouth 2 (two) times daily with a meal. 09/02/23 10/22/23 Yes Georganna Skeans, MD  furosemide (LASIX) 20 MG tablet Take 1 tablet (20 mg total) by mouth daily. 09/02/23  Yes Georganna Skeans, MD  albuterol (VENTOLIN HFA) 108 (90 Base) MCG/ACT  inhaler Inhale 2 puffs into the lungs every 6 (six) hours as needed for wheezing or shortness of breath. Patient not taking: Reported on 10/22/2023 09/02/23   Georganna Skeans, MD  escitalopram (LEXAPRO) 5 MG tablet Take 1 tablet (5 mg total) by mouth daily. Patient not taking: Reported on 10/22/2023 09/02/23   Georganna Skeans, MD  gabapentin (NEURONTIN) 300 MG capsule Take 3 capsules (900 mg total) by mouth 3 (three) times daily. Patient not taking: Reported on 10/22/2023 09/02/23 10/02/23  Georganna Skeans, MD  oxyCODONE (OXY IR/ROXICODONE) 5 MG immediate release tablet Take 1 tablet (5 mg total) by mouth every 4 (four) hours as needed for severe pain. Patient not taking: Reported on 10/22/2023 06/16/23   Jerald Kief, MD  oxyCODONE (OXYCONTIN) 10 mg 12 hr tablet Take 2 tablets (20 mg total) by mouth every 12 (twelve) hours. Patient not taking: Reported on 10/22/2023 06/16/23   Jerald Kief, MD      Allergies    Acetaminophen, Motrin [ibuprofen], Nsaids, Lyrica cr [pregabalin er], and Peanut-containing drug products    Review of Systems   Review of Systems A 10 point review of systems was performed and is negative unless otherwise reported in HPI.  Physical Exam Updated Vital Signs BP 104/78 (BP Location: Left Arm)   Pulse 86   Temp 99.1 F (37.3 C) (Oral)   Resp 17   Ht 5\' 9"  (1.753 m)   Wt 89.8  kg   SpO2 94%   BMI 29.24 kg/m  Physical Exam General: Chronically ill-appearing male, lying in bed.  HEENT: Sclera anicteric, MMM, trachea midline.  Cardiology: RRR, no murmurs/rubs/gallops.  Resp: Normal respiratory rate and effort. CTAB, no wheezes, rhonchi, crackles.  Abd: Soft, non-tender, non-distended. No rebound tenderness or guarding.  GU: Deferred. MSK: S/p BL BKAs. Mild focal TTP to lateral portion of distal left stump. No overlying erythema, induration, fluctuance, wounds, deformity.  No crepitus, discoloration.  No evidence of trauma. No tenderness to palpation along venous  tracts. Full range of motion of knee and hip.  No knee effusion, erythema, or tenderness to palpation.  No peripheral edema or signs of trauma. Skin: warm, dry.  Neuro: A&Ox4, CNs II-XII grossly intact. MAEs. Sensation grossly intact.  Psych: Normal mood and affect.   ED Results / Procedures / Treatments   Labs (all labs ordered are listed, but only abnormal results are displayed) Labs Reviewed  COMPREHENSIVE METABOLIC PANEL - Abnormal; Notable for the following components:      Result Value   Glucose, Bld 183 (*)    BUN 24 (*)    Total Bilirubin 1.5 (*)    All other components within normal limits  SALICYLATE LEVEL - Abnormal; Notable for the following components:   Salicylate Lvl <7.0 (*)    All other components within normal limits  ACETAMINOPHEN LEVEL - Abnormal; Notable for the following components:   Acetaminophen (Tylenol), Serum <10 (*)    All other components within normal limits  ETHANOL  CBC  RAPID URINE DRUG SCREEN, HOSP PERFORMED    EKG EKG Interpretation Date/Time:  Thursday October 22 2023 16:11:54 EST Ventricular Rate:  83 PR Interval:  194 QRS Duration:  96 QT Interval:  440 QTC Calculation: 517 R Axis:   -22  Text Interpretation: Normal sinus rhythm Moderate voltage criteria for LVH, may be normal variant ( Sokolow-Lyon , Cornell product ) ST & Marked T wave abnormality, consider anterolateral ischemia Prolonged QT Abnormal ECG When compared with ECG of 01-Mar-2023 16:08, PREVIOUS ECG IS PRESENT Confirmed by Beckey Downing [phone]) on 10/22/2023 5:02:37 PM  Radiology No results found.  Procedures Procedures    Medications Ordered in ED Medications - No data to display  ED Course/ Medical Decision Making/ A&P                          Medical Decision Making Amount and/or Complexity of Data Reviewed Labs: ordered. Decision-making details documented in ED Course.    This patient presents to the ED for concern of SI, left leg pain, this involves an  extensive number of treatment options, and is a complaint that carries with it a high risk of complications and morbidity.  I considered the following differential and admission for this acute, potentially life threatening condition.   MDM:    Patient with pain in L stump area. Physical exam is unrevealing. No indication of cellulitis/abscess, nec fasc, septic arthritis, trauma, deformity.  He does not have any swelling, no tenderness palpation along the venous tracts, no swelling, very low c/f DVT. Cannot give tylenol/ibuprofen d/t reported anaphylaxis. Patient appears comfortable, will CTM, but doubt emergent etiology of pain there. No leukocytosis or fever to indicate infection, either. Labs unremarkable. Patient is non-toxic appearing. Will consult to psych for SI with plan. Patient does have h/o similar.  Clinical Course as of 10/22/23 1840  Thu Oct 22, 2023  1747 cbc wnl [HN]  [phone]  EtOH, acteaminophen, salicylate level negative. CMP Unremarkable in the context of this patient's presentation. [HN]    Clinical Course User Index [HN] Loetta Rough, MD    Labs: I Ordered, and personally interpreted labs.  The pertinent results include: Those listed above  Additional history obtained from chart review.    Social Determinants of Health:  lives independently  Disposition:  MCPP  Co morbidities that complicate the patient evaluation  Past Medical History:  Diagnosis Date   Asthma    Bipolar 1 disorder (HCC)    CHF (congestive heart failure) (HCC)    COPD (chronic obstructive pulmonary disease) (HCC)    Depression    Diabetes mellitus without complication (HCC)    GERD (gastroesophageal reflux disease)    Homelessness    Hypertension    QT prolongation    Schizoaffective disorder (HCC)      Medicines No orders of the defined types were placed in this encounter.   I have reviewed the patients home medicines and have made adjustments as needed  Problem List / ED  Course: Problem List Items Addressed This Visit       Other   Suicidal ideation - Primary   Other Visit Diagnoses     Left leg pain                       This note was created using dictation software, which may contain spelling or grammatical errors.    Loetta Rough, MD 10/22/23 [phone]

## 2023-10-22 NOTE — ED Triage Notes (Signed)
PER EMS: pt is from home with c/o plan to commit suicide via kitchen knife. He was discharged from [facility] yesterday, was seen there for same complaint and states he feels like he was discharged too soon. He reports he smoked crack yesterday as well.   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   VS: 144/952, HR-95, 97%RA, CBG-157

## 2023-10-23 ENCOUNTER — Other Ambulatory Visit (HOSPITAL_COMMUNITY): Payer: Self-pay

## 2023-10-23 DIAGNOSIS — F1490 Cocaine use, unspecified, uncomplicated: Secondary | ICD-10-CM | POA: Diagnosis present

## 2023-10-23 LAB — MAGNESIUM: Magnesium: 2.1 mg/dL (ref 1.7–2.4)

## 2023-10-23 MED ORDER — POTASSIUM CHLORIDE CRYS ER 20 MEQ PO TBCR
40.0000 meq | EXTENDED_RELEASE_TABLET | Freq: Once | ORAL | Status: AC
  Administered 2023-10-23: 40 meq via ORAL
  Filled 2023-10-23: qty 2

## 2023-10-23 MED ORDER — CARIPRAZINE HCL 1.5 MG PO CAPS
1.5000 mg | ORAL_CAPSULE | Freq: Every day | ORAL | 2 refills | Status: AC
  Filled 2023-10-23: qty 30, 30d supply, fill #0

## 2023-10-23 MED ORDER — CARIPRAZINE HCL 1.5 MG PO CAPS
1.5000 mg | ORAL_CAPSULE | Freq: Every day | ORAL | Status: DC
  Administered 2023-10-23: 1.5 mg via ORAL
  Filled 2023-10-23 (×2): qty 1

## 2023-10-23 MED ORDER — BACLOFEN 10 MG PO TABS
5.0000 mg | ORAL_TABLET | Freq: Three times a day (TID) | ORAL | Status: DC | PRN
  Administered 2023-10-23: 5 mg via ORAL
  Filled 2023-10-23: qty 1

## 2023-10-23 MED ORDER — OXYCODONE HCL 5 MG PO TABS
5.0000 mg | ORAL_TABLET | Freq: Four times a day (QID) | ORAL | Status: DC | PRN
  Administered 2023-10-23: 5 mg via ORAL
  Filled 2023-10-23: qty 1

## 2023-10-23 NOTE — ED Notes (Signed)
Per Dr. Eloise Harman, pt to d/c in AM.

## 2023-10-23 NOTE — ED Notes (Signed)
Pt asking for phone and to talk with SW.  While this RN is working with another patient, this pt is heard yelling at sitter to "do your damn job".  This RN approaches pt to ask him to lower his voice and be more respectful of staff and other pts.  This pt continues to yell at staff, sitter told to stop attempted conversation with pt.  Pt then returns to his room and continues to talk in a more controlled manner.

## 2023-10-23 NOTE — Progress Notes (Signed)
[name] Progress Note  [id number]   [name]  10/23/2023  2:10 AM    Inpatient Behavioral Health Placement  Pt meets inpatient criteria per Assunta Gambles, NP [facility]. There are no available beds within [facility] system per [facility] Antoinette Cillo, RN. Referral was sent to the following facilities;   Destination  Service Provider Address Phone [facility] Luis Llorons Torres  [address] [place], [name] [address] [phone]  [facility]  [address] [name] [address] [phone]  [facility] Patient Placement  [facility], [name] [phone]  [facility]  [address] [name] [address] [phone]  [facility]  [address] [name] [address] [phone]  [facility]  [address] [name] [address] [phone]  [facility]  [address] [place], [name] [address] [phone]  [facility]  [address] [place], [name] [address] [phone]  [facility]  [address] [name] [address] [phone]  [facility]  [address] [name] [address] [phone]  [facility]  [address] [name] [address] [phone]  [facility]  [address], [name] [address] [phone]  [facility]  [address] [name] [address] [phone]  [facility]  [address] [name] [address] [phone]  [facility]  [name] [phone]  [facility]  [address] [name]  [address] [phone]  [facility] E[facility]  [address] [place]s [name] [phone]  [facility]  [address], [name] [address] [phone]  [facility]  [address] [name] [address] [phone]  [facility]  [address], [name] [address] [phone]  [facility]  [address] [name] [address] [phone]  [facility]  [address], [name] [address] [phone]  [facility]  [address] [name] [address] [phone]  [facility]  [name] [phone]  [facility]  [address] [name] [address] [phone]  [facility]  [address], [name] [address] [phone]  [facility]  [address] [place], [name] [address] [phone]  [facility]  [address] [name] [address] [phone]    Situation ongoing,  CSW will follow up.    Maryjean Ka, MSW, LCSWA 10/23/2023 2:10 AM

## 2023-10-23 NOTE — Consult Note (Signed)
[facility] ED ASSESSMENT   Reason for Consult:  Psych consult Referring Physician:  Lanny Cramp Patient Identification: [name] MRN:  [id number] ED Chief Complaint: Cocaine use  Diagnosis:  Principal Problem:   Cocaine use Active Problems:   Cocaine use disorder, severe, dependence (HCC)   Substance induced mood disorder (HCC)   Suicidal ideation   ED Assessment Time Calculation: Start Time: 1000 Stop Time: 1100 Total Time in Minutes (Assessment Completion): 60  HPI:  [name] is a 57 y.o. male patient brought in by EMS from home with a plan to commit suicide with a kitchen knife.  He was discharged from [facility] and immediately used "crack". Patient has a history of CHF, heart failure, stroke, HTN, DVT, cardiomyopathy, asthma, COPD, DM2, polysubstance abuse, MDD, schizoaffective disorder and suicidal ideation. Patient also has a left BKA.  Subjective:   [name] is a 57 y.o. male patient brought in by EMS from home with a plan to commit suicide with a kitchen knife.  He was discharged from [facility] and immediately used "crack". Patient has a history of CHF, heart failure, stroke, HTN, DVT, cardiomyopathy, asthma, COPD, DM2, polysubstance abuse, MDD, schizoaffective disorder and suicidal ideation. Patient also has a left BKA.  [name], 57 y.o., male patient seen face to face by this provider, consulted with Dr. Lucianne Muss; and chart reviewed on 10/23/23.  On evaluation [name] reports he is not suicidal today; he does endorse auditory hallucinations, but states dismissively, "I have those all the time".  He said he was released from [facility] "too soon and I got home and used crack".  Patient said "I want to stay in the hospital until I don't want to use drugs anymore".  The provider clarifies the purpose of an inpatient stay in relation to substance abuse and mental health, then clarified the purpose and role of outpatient treatment, intensive outpatient  treatment and utilization of 12 step programs for substance abuse and mental health challenges.  Patient was irritated and said he didn't want to talk anymore and wanted to leave.     During evaluation [name] is sitting on a bed in no acute distress.  He is alert, oriented x 4, and initially calm, cooperative and attentive.  His mood is irritable with congruent affect.  He has normal speech, and behavior.  Objectively there is no evidence of psychosis/mania or delusional thinking.  Patient is able to converse coherently, goal directed thoughts, no distractibility, or pre-occupation.  He denies suicidal/self-harm/homicidal ideation and paranoia.  He endorses psychosis. Patient answered questions appropriately.    Patient is not a danger to himself or others and is not intoxicated.  He does not meet criteria for inpatient psychiatric treatment.  Patient is encouraged to follow up with an outpatient provider and to consider working with 12 step programs.   Past Psychiatric History: polysubstance abuse, MDD, schizoaffective disorder and suicidal ideation  Risk to Self or Others: Is the patient at risk to self? No Has the patient been a risk to self in the past 6 months? Yes Has the patient been a risk to self within the distant past? Yes Is the patient a risk to others? No Has the patient been a risk to others in the past 6 months? No Has the patient been a risk to others within the distant past? No  [place] Scale:  Flowsheet Row ED from 10/22/2023 in [facility] Department at [facility] ED to Hosp-Admission (Discharged) from 12/07/2022 in  [facility] ED to Hosp-Admission (Discharged) from 11/23/2022 in [facility]  C-SSRS RISK CATEGORY High Risk No Risk High Risk        Substance Abuse:   Polysubstance abuse - most recent use "crack"  Past Medical History:  Past Medical History:  Diagnosis  Date   Asthma    Bipolar 1 disorder (HCC)    CHF (congestive heart failure) (HCC)    COPD (chronic obstructive pulmonary disease) (HCC)    Depression    Diabetes mellitus without complication (HCC)    GERD (gastroesophageal reflux disease)    Homelessness    Hypertension    QT prolongation    Schizoaffective disorder (HCC)     Past Surgical History:  Procedure Laterality Date   AMPUTATION Left 12/10/2022   Procedure: LEFT BELOW KNEE AMPUTATION;  Surgeon: Nadara Mustard, MD;  Location: [facility] OR;  Service: Orthopedics;  Laterality: Left;   AMPUTATION TOE Bilateral 01/02/2021   Procedure: AMPUTATION TOE;  Surgeon: Park Liter, DPM;  Location: [facility] OR;  Service: Podiatry;  Laterality: Bilateral;  Ok to leave patient on hospital bed   IR ANGIO INTRA EXTRACRAN SEL COM CAROTID INNOMINATE BILAT MOD SED  12/17/2020   IR ANGIO VERTEBRAL SEL VERTEBRAL BILAT MOD SED  12/17/2020   MULTIPLE EXTRACTIONS WITH ALVEOLOPLASTY N/A 03/25/2023   Procedure: IRRIGATION AND DEBRIDEMENT, MULTIPLE EXTRACTION WITH ALVEOLOPLASTY X 4 , EXTRACTIONS 4 THROUGH 13, 20 THROUGHT 29;  Surgeon: Vivia Ewing, DMD;  Location: [facility] OR;  Service: Oral Surgery;  Laterality: N/A;   RADIOLOGY WITH ANESTHESIA N/A 12/17/2020   Procedure: IR WITH ANESTHESIA;  Surgeon: Radiologist, Medication, MD;  Location: [facility] OR;  Service: Radiology;  Laterality: N/A;   Family History:  Family History  Problem Relation Age of Onset   Drug abuse Mother    Suicidality Mother    Family Psychiatric  History: None noted Social History:  Social History   Substance and Sexual Activity  Alcohol Use Yes     Social History   Substance and Sexual Activity  Drug Use Yes   Types: Cocaine    Social History   Socioeconomic History   Marital status: Single    Spouse name: Not on file   Number of children: 4   Years of education: Not on file   Highest education level: Not on file  Occupational History   Occupation: N/A  Tobacco Use   Smoking status:  Some Days    Types: Cigarettes   Smokeless tobacco: Never  Vaping Use   Vaping status: Never Used  Substance and Sexual Activity   Alcohol use: Yes   Drug use: Yes    Types: Cocaine   Sexual activity: Not Currently    Partners: Female    Birth control/protection: None  Other Topics Concern   Not on file  Social History Narrative   ** Merged History Encounter **       Social Determinants of Health   Financial Resource Strain: High Risk (07/24/2021)   Received from [facility]   Overall Financial Resource Strain (CARDIA)    Difficulty of Paying Living Expenses: Hard  Food Insecurity: Food Insecurity Present (11/24/2022)   Hunger Vital Sign    Worried About Running Out of Food in the Last Year: Often true    Ran Out of Food in the Last Year: Often true  Transportation Needs: Unmet Transportation Needs (12/07/2022)   PRAPARE - Transportation  Lack of Transportation (Medical): No    Lack of Transportation (Non-Medical): Yes  Physical Activity: Not on file  Stress: Stress Concern Present (07/24/2021)   Received from [facility] of Occupational [facility] - Occupational Stress Questionnaire    Feeling of Stress : To some extent  Social Connections: Unknown (04/05/2022)   Received from [facility]   Social Network    Social Network: Not on file   Additional Social History: Patient lives alone    Allergies:   Allergies  Allergen Reactions   Acetaminophen Hives, Nausea And Vomiting, Rash and Anaphylaxis    PT states that if he takes this he is going to the hospital   Motrin [Ibuprofen] Anaphylaxis   Nsaids Anaphylaxis    PT states that if he takes this he is going to the hospital   Lyrica Cr [Pregabalin Er] Other (See Comments)    Dystonia Dysarthria    Peanut-Containing Drug Products Other (See Comments)    Pt denies allergy to peanut butter 03/02/2021    Labs:  Results for orders placed or performed  during the hospital encounter of 10/22/23 (from the past 48 hour(s))  Comprehensive metabolic panel     Status: Abnormal   Collection Time: 10/22/23  4:43 PM  Result Value Ref Range   Sodium 136 135 - 145 mmol/L   Potassium 3.6 3.5 - 5.1 mmol/L   Chloride 103 98 - 111 mmol/L   CO2 22 22 - 32 mmol/L   Glucose, Bld 183 (H) 70 - 99 mg/dL    Comment: Glucose reference range applies only to samples taken after fasting for at least 8 hours.   BUN 24 (H) 6 - 20 mg/dL   Creatinine, Ser 4.09 0.61 - 1.24 mg/dL   Calcium 9.0 8.9 - 81.1 mg/dL   Total Protein 7.1 6.5 - 8.1 g/dL   Albumin 3.8 3.5 - 5.0 g/dL   AST 27 15 - 41 U/L   ALT 26 0 - 44 U/L   Alkaline Phosphatase 68 38 - 126 U/L   Total Bilirubin 1.5 (H) <1.2 mg/dL   GFR, Estimated >91 >47 mL/min    Comment: (NOTE) Calculated using the CKD-EPI Creatinine Equation (2021)    Anion gap 11 5 - 15    Comment: Performed at [facility] Lab, [address] [place], [name] [address]  Ethanol     Status: None   Collection Time: 10/22/23  4:43 PM  Result Value Ref Range   Alcohol, Ethyl (B) <10 <10 mg/dL    Comment: (NOTE) Lowest detectable limit for serum alcohol is 10 mg/dL.  For medical purposes only. Performed at [facility], [address], [place], [name] [address]   Salicylate level     Status: Abnormal   Collection Time: 10/22/23  4:43 PM  Result Value Ref Range   Salicylate Lvl <7.0 (L) 7.0 - 30.0 mg/dL    Comment: Performed at [facility], [address], [place], [name] [address]  Acetaminophen level     Status: Abnormal   Collection Time: 10/22/23  4:43 PM  Result Value Ref Range   Acetaminophen (Tylenol), Serum <10 (L) 10 - 30 ug/mL    Comment: (NOTE) Therapeutic concentrations vary significantly. A range of 10-30 ug/mL  may be an effective concentration for many patients. However, some  are best treated at concentrations outside of this range. Acetaminophen concentrations >150 ug/mL at 4 hours after  ingestion  and >  50 ug/mL at 12 hours after ingestion are often associated with  toxic reactions.  Performed at [facility], [address], [place], [name] [address]   cbc     Status: None   Collection Time: 10/22/23  4:43 PM  Result Value Ref Range   WBC 6.6 4.0 - 10.5 K/uL   RBC 5.25 4.22 - 5.81 MIL/uL   Hemoglobin 16.6 13.0 - 17.0 g/dL   HCT 21.3 08.6 - 57.8 %   MCV 92.0 80.0 - 100.0 fL   MCH 31.6 26.0 - 34.0 pg   MCHC 34.4 30.0 - 36.0 g/dL   RDW 46.9 62.9 - 52.8 %   Platelets 235 150 - 400 K/uL   nRBC 0.0 0.0 - 0.2 %    Comment: Performed at [facility], [address] [place], [name] [address]  Magnesium     Status: None   Collection Time: 10/22/23  4:43 PM  Result Value Ref Range   Magnesium 2.1 1.7 - 2.4 mg/dL    Comment: Performed at [facility], [address], [place], [name] [address]  Rapid urine drug screen (hospital performed)     Status: Abnormal   Collection Time: 10/22/23  7:42 PM  Result Value Ref Range   Opiates NONE DETECTED NONE DETECTED   Cocaine POSITIVE (A) NONE DETECTED   Benzodiazepines NONE DETECTED NONE DETECTED   Amphetamines NONE DETECTED NONE DETECTED   Tetrahydrocannabinol NONE DETECTED NONE DETECTED   Barbiturates NONE DETECTED NONE DETECTED    Comment: (NOTE) DRUG SCREEN FOR MEDICAL PURPOSES ONLY.  IF CONFIRMATION IS NEEDED FOR ANY PURPOSE, NOTIFY LAB WITHIN 5 DAYS.  LOWEST DETECTABLE LIMITS FOR URINE DRUG SCREEN Drug Class                     Cutoff (ng/mL) Amphetamine and metabolites    1000 Barbiturate and metabolites    200 Benzodiazepine                 200 Opiates and metabolites        300 Cocaine and metabolites        300 THC                            50 Performed at [facility], [address] [place], [name] [address]     Current Facility-Administered Medications  Medication Dose Route Frequency Provider Last Rate Last Admin   baclofen (LIORESAL) tablet 5 mg  5 mg Oral TID PRN  Loetta Rough, MD   5 mg at 10/23/23 0026   diphenhydrAMINE (BENADRYL) capsule 25 mg  25 mg Oral Q6H PRN Hunt, Katlin E, NP       diphenhydrAMINE (BENADRYL) injection 25 mg  25 mg Intramuscular Q6H PRN Hunt, Katlin E, NP       OLANZapine (ZYPREXA) injection 10 mg  10 mg Intramuscular Once PRN Hunt, Katlin E, NP       OLANZapine zydis (ZYPREXA) disintegrating tablet 10 mg  10 mg Oral Q6H PRN Hunt, Katlin E, NP       oxyCODONE (Oxy IR/ROXICODONE) immediate release tablet 5 mg  5 mg Oral Q6H PRN Loetta Rough, MD   5 mg at 10/23/23 0026   Current Outpatient Medications  Medication Sig Dispense Refill   ARIPiprazole (ABILIFY) 5 MG tablet Take 1 tablet (5 mg total) by mouth daily. 30 tablet 0   Baclofen  5 MG TABS Take 1 tablet (5 mg total) by mouth 3 (three) times daily as needed. 30 tablet 0   carvedilol (COREG) 12.5 MG tablet Take 1 tablet (12.5 mg total) by mouth 2 (two) times daily with a meal. 60 tablet 0   furosemide (LASIX) 20 MG tablet Take 1 tablet (20 mg total) by mouth daily. 30 tablet 0   albuterol (VENTOLIN HFA) 108 (90 Base) MCG/ACT inhaler Inhale 2 puffs into the lungs every 6 (six) hours as needed for wheezing or shortness of breath. (Patient not taking: Reported on 10/22/2023) 18 g 0   escitalopram (LEXAPRO) 5 MG tablet Take 1 tablet (5 mg total) by mouth daily. (Patient not taking: Reported on 10/22/2023) 30 tablet 0   gabapentin (NEURONTIN) 300 MG capsule Take 3 capsules (900 mg total) by mouth 3 (three) times daily. (Patient not taking: Reported on 10/22/2023) 270 capsule 0   oxyCODONE (OXY IR/ROXICODONE) 5 MG immediate release tablet Take 1 tablet (5 mg total) by mouth every 4 (four) hours as needed for severe pain. (Patient not taking: Reported on 10/22/2023) 20 tablet 0   oxyCODONE (OXYCONTIN) 10 mg 12 hr tablet Take 2 tablets (20 mg total) by mouth every 12 (twelve) hours. (Patient not taking: Reported on 10/22/2023) 20 tablet 0    Musculoskeletal: Strength & Muscle Tone:  within normal limits Gait & Station:  Left BKA; he uses a wheelchair Patient leans:  Uses a wheelchair   Psychiatric Specialty Exam: Presentation  General Appearance:  Appropriate for Environment  Eye Contact: Good  Speech: Clear and Coherent  Speech Volume: Normal  Handedness: Right   Mood and Affect  Mood: Euthymic  Affect: Congruent   Thought Process  Thought Processes: Linear; Goal Directed  Descriptions of Associations:Intact  Orientation:Full (Time, Place and Person)  Thought Content:Logical  History of Schizophrenia/Schizoaffective disorder:No data recorded Duration of Psychotic Symptoms:No data recorded Hallucinations:Hallucinations: None Description of Command Hallucinations: voices telling him to kill himself; derogatory calling him worthless Description of Auditory Hallucinations: command type auditory hallucinations  Ideas of Reference:None  Suicidal Thoughts:Suicidal Thoughts: No SI Active Intent and/or Plan: With Plan; With Means to Carry Out  Homicidal Thoughts:Homicidal Thoughts: No   Sensorium  Memory: Immediate Fair; Recent Fair; Remote Fair  Judgment: Impaired  Insight: Lacking   Executive Functions  Concentration: Fair  Attention Span: Fair  Recall: [employer] of Knowledge: Fair  Language: Fair   Psychomotor Activity  Psychomotor Activity: Psychomotor Activity: Normal   Assets  Assets: Manufacturing systems engineer; Housing; Leisure Time    Sleep  Sleep: Sleep: Good Number of Hours of Sleep: 7   Physical Exam: Physical Exam Vitals and nursing note reviewed.  Eyes:     Pupils: Pupils are equal, round, and reactive to light.  Pulmonary:     Effort: Pulmonary effort is normal.  Skin:    General: Skin is dry.  Neurological:     Mental Status: He is alert and oriented to person, place, and time.    Review of Systems  Musculoskeletal:        Left BKA  All other systems reviewed and are  negative.  Blood pressure 104/78, pulse 86, temperature 99.1 F (37.3 C), temperature source Oral, resp. rate 17, height 5\' 9"  (1.753 m), weight 89.8 kg, SpO2 94%. Body mass index is 29.24 kg/m.  Medical Decision Making: Patient case reviewed and discussed with Dr Lucianne Muss.  Patient stated he wants to stay in the hospital until he no longer wants to use substances;  patient is educated about the purpose of an inpatient hospitalization.  Patient is not a danger to himself or others and is not intoxicated.  He does not meet criteria for inpatient psychiatric treatment.  Patient is encouraged to follow up with an outpatient provider and to consider working with 12 step programs.  Patient is psychiatrically cleared.  Problem 1: Cocaine use -Recommend follow up with an outpatient provider and join a 12 step program   Disposition: Patient does not meet criteria for psychiatric inpatient admission. Patient is psychiatrically cleared.  Thomes Lolling, NP 10/23/2023 11:05 AM

## 2023-10-23 NOTE — Discharge Instructions (Signed)
1) Attend 12 step meetings. A list of local and online meetings can be found at [email or web address]  2) Follow up with or establish care with an outpatient provider.  You can arrive at the [facility] any Monday - Friday by 7:30am and be seen to establish outpatient care.

## 2023-10-23 NOTE — ED Provider Notes (Addendum)
Psych has cleared the patient for discharge.  They have ordered him Vraylar which will be safe for him to take due to his prolonged QTc.  Have discontinued his other psychotropic medications at this time.  Will have him follow-up with his outpatient psychiatry provider.   Rondel Baton, MD 10/23/23 [phone]

## 2023-10-23 NOTE — ED Provider Notes (Signed)
Emergency Medicine Observation Re-evaluation Note  Berklee Zachar is a 57 y.o. male, seen on rounds today.  Pt initially presented to the ED for complaints of Suicidal Currently, the patient is not having any acute complaints.  Physical Exam  BP 104/78 (BP Location: Left Arm)   Pulse 86   Temp 99.1 F (37.3 C) (Oral)   Resp 17   Ht 5\' 9"  (1.753 m)   Wt 89.8 kg   SpO2 94%   BMI 29.24 kg/m  Physical Exam General: Resting comfortably in stretcher Lungs: Normal work of breathing Psych: Calm  ED Course / MDM  EKG:EKG Interpretation Date/Time:  Friday October 23 2023 00:11:25 EST Ventricular Rate:  69 PR Interval:  208 QRS Duration:  96 QT Interval:  504 QTC Calculation: 540 R Axis:   43  Text Interpretation: Normal sinus rhythm Biatrial enlargement Left ventricular hypertrophy ( Sokolow-Lyon , Cornell product , Romhilt-Estes ) ST & Marked T wave abnormality, consider anterolateral ischemia Prolonged QT Abnormal ECG When compared with ECG of 23-Oct-2023 00:10, No significant change was found Confirmed by Dione Booze ([id number]) on 10/23/2023 6:05:48 AM  I have reviewed the labs performed to date as well as medications administered while in observation.  Recent changes in the last 24 hours include seen by psychiatry.  They are recommending inpatient treatment.  QTc was prolonged and so they recommend holding psychotropic medications at this time.  Repeat EKG this morning shows persistently prolonged QT.  Plan  Current plan is for continue holding psychiatric medications due to prolonged QT.  Check magnesium level.  Will give some potassium at this time as well.  Repeat EKG tomorrow.  Inpatient psychiatric placement.    Rondel Baton, MD 10/23/23 [phone]

## 2023-10-24 ENCOUNTER — Other Ambulatory Visit (HOSPITAL_COMMUNITY): Payer: Self-pay

## 2023-10-24 NOTE — ED Notes (Signed)
Refused AM vitals.

## 2023-10-28 ENCOUNTER — Other Ambulatory Visit: Payer: Self-pay | Admitting: Family Medicine

## 2023-10-28 MED ORDER — ALBUTEROL SULFATE HFA 108 (90 BASE) MCG/ACT IN AERS: 2.0000 | INHALATION_SPRAY | Freq: Four times a day (QID) | RESPIRATORY_TRACT | 0 refills | Status: DC | PRN

## 2023-10-28 MED ORDER — GABAPENTIN 300 MG PO CAPS: 900.0000 mg | ORAL_CAPSULE | Freq: Three times a day (TID) | ORAL | 0 refills | Status: DC

## 2023-10-28 MED ORDER — CARVEDILOL 12.5 MG PO TABS: 12.5000 mg | ORAL_TABLET | Freq: Two times a day (BID) | ORAL | 0 refills | Status: DC

## 2023-10-28 MED ORDER — FUROSEMIDE 20 MG PO TABS: 20.0000 mg | ORAL_TABLET | Freq: Every day | ORAL | 0 refills | Status: DC

## 2023-10-28 MED ORDER — BACLOFEN 5 MG PO TABS: 5.0000 mg | ORAL_TABLET | Freq: Three times a day (TID) | ORAL | 0 refills | Status: DC | PRN

## 2023-10-28 NOTE — Telephone Encounter (Signed)
Medication Refill -  Most Recent Primary Care Visit:  Provider: Georganna Skeans  Department: [facility]  Visit Type: HOSPITAL FU  Date: 07/28/2023  Medication: norvasc 10mg / albuterol/ baclofen 5mg  /carvedilol (COREG) 12.5 MG tablet furosemide /(LASIX) 20 MG tablet gabapetin /bidil 20-37.5   Has the patient contacted their pharmacy? no Erin, Case manager called in for pt  Is this the correct pharmacy for this prescription? yes  This is the patient's preferred pharmacy:  [facility] [place], [name] - [address] [address] [facility] [address] [facility] [name] [address] Phone: [phone] Fax: [phone]     Has the prescription been filled recently? no  Is the patient out of the medication? yes  Has the patient been seen for an appointment in the last year OR does the patient have an upcoming appointment? yes  Can we respond through MyChart? yes  Agent: Please be advised that Rx refills may take up to 3 business days. We ask that you follow-up with your pharmacy.

## 2023-10-28 NOTE — Telephone Encounter (Signed)
Requested Prescriptions  Pending Prescriptions Disp Refills   albuterol (VENTOLIN HFA) 108 (90 Base) MCG/ACT inhaler 18 g 0    Sig: Inhale 2 puffs into the lungs every 6 (six) hours as needed for wheezing or shortness of breath.     Pulmonology:  Beta Agonists 2 Failed - 10/28/2023  2:54 PM      Failed - Last BP in normal range    BP Readings from Last 1 Encounters:  10/23/23 (!) 161/92         Passed - Last Heart Rate in normal range    Pulse Readings from Last 1 Encounters:  10/23/23 72         Passed - Valid encounter within last 12 months    Recent Outpatient Visits           3 months ago Chronic pain syndrome   [facility], Lauris Poag, MD               Baclofen 5 MG TABS 30 tablet 0    Sig: Take 1 tablet (5 mg total) by mouth 3 (three) times daily as needed.     Analgesics:  Muscle Relaxants - baclofen Passed - 10/28/2023  2:54 PM      Passed - Cr in normal range and within 180 days    Creatinine, Ser  Date Value Ref Range Status  10/22/2023 0.98 0.61 - 1.24 mg/dL Final         Passed - eGFR is 30 or above and within 180 days    GFR calc Af Amer  Date Value Ref Range Status  08/21/2020 >60 >60 mL/min Final   GFR, Estimated  Date Value Ref Range Status  10/22/2023 >60 >60 mL/min Final    Comment:    (NOTE) Calculated using the CKD-EPI Creatinine Equation (2021)          Passed - Valid encounter within last 6 months    Recent Outpatient Visits           3 months ago Chronic pain syndrome   [facility], Lauris Poag, MD               carvedilol (COREG) 12.5 MG tablet 60 tablet 0    Sig: Take 1 tablet (12.5 mg total) by mouth 2 (two) times daily with a meal.     Cardiovascular: Beta Blockers 3 Failed - 10/28/2023  2:54 PM      Failed - Last BP in normal range    BP Readings from Last 1 Encounters:  10/23/23 (!) 161/92         Passed - Cr in normal range and within 360 days     Creatinine, Ser  Date Value Ref Range Status  10/22/2023 0.98 0.61 - 1.24 mg/dL Final         Passed - AST in normal range and within 360 days    AST  Date Value Ref Range Status  10/22/2023 27 15 - 41 U/L Final         Passed - ALT in normal range and within 360 days    ALT  Date Value Ref Range Status  10/22/2023 26 0 - 44 U/L Final         Passed - Last Heart Rate in normal range    Pulse Readings from Last 1 Encounters:  10/23/23 72         Passed - Valid encounter within  last 6 months    Recent Outpatient Visits           3 months ago Chronic pain syndrome   [facility], Lauris Poag, MD               furosemide (LASIX) 20 MG tablet 30 tablet 0    Sig: Take 1 tablet (20 mg total) by mouth daily.     Cardiovascular:  Diuretics - Loop Failed - 10/28/2023  2:54 PM      Failed - Last BP in normal range    BP Readings from Last 1 Encounters:  10/23/23 (!) 161/92         Passed - K in normal range and within 180 days    Potassium  Date Value Ref Range Status  10/22/2023 3.6 3.5 - 5.1 mmol/L Final         Passed - Ca in normal range and within 180 days    Calcium  Date Value Ref Range Status  10/22/2023 9.0 8.9 - 10.3 mg/dL Final   Calcium, Ion  Date Value Ref Range Status  08/16/2022 1.07 (L) 1.15 - 1.40 mmol/L Final         Passed - Na in normal range and within 180 days    Sodium  Date Value Ref Range Status  10/22/2023 136 135 - 145 mmol/L Final         Passed - Cr in normal range and within 180 days    Creatinine, Ser  Date Value Ref Range Status  10/22/2023 0.98 0.61 - 1.24 mg/dL Final         Passed - Cl in normal range and within 180 days    Chloride  Date Value Ref Range Status  10/22/2023 103 98 - 111 mmol/L Final         Passed - Mg Level in normal range and within 180 days    Magnesium  Date Value Ref Range Status  10/22/2023 2.1 1.7 - 2.4 mg/dL Final    Comment:    Performed at [facility], [address], [place], [name] [address]         Passed - Valid encounter within last 6 months    Recent Outpatient Visits           3 months ago Chronic pain syndrome   [facility], Lauris Poag, MD               gabapentin (NEURONTIN) 300 MG capsule 270 capsule 0    Sig: Take 3 capsules (900 mg total) by mouth 3 (three) times daily.     Neurology: Anticonvulsants - gabapentin Passed - 10/28/2023  2:54 PM      Passed - Cr in normal range and within 360 days    Creatinine, Ser  Date Value Ref Range Status  10/22/2023 0.98 0.61 - 1.24 mg/dL Final         Passed - Completed PHQ-2 or PHQ-9 in the last 360 days      Passed - Valid encounter within last 12 months    Recent Outpatient Visits           3 months ago Chronic pain syndrome   [facility], MD

## 2023-11-16 ENCOUNTER — Telehealth: Payer: Self-pay | Admitting: Family Medicine

## 2023-11-16 NOTE — Telephone Encounter (Signed)
Copied from CRM [id number]. Topic: Referral - Request for Referral >> Nov 16, 2023  1:18 PM Clide Dales wrote: Patient would like to have his referral to a Pain Management Clinic resent. Please advise.

## 2023-12-15 ENCOUNTER — Other Ambulatory Visit: Payer: Self-pay | Admitting: Family Medicine

## 2023-12-20 ENCOUNTER — Encounter (HOSPITAL_COMMUNITY): Payer: Self-pay | Admitting: Family Medicine

## 2023-12-20 ENCOUNTER — Inpatient Hospital Stay (HOSPITAL_COMMUNITY): Payer: MEDICAID

## 2023-12-20 ENCOUNTER — Other Ambulatory Visit: Payer: Self-pay

## 2023-12-20 ENCOUNTER — Inpatient Hospital Stay (HOSPITAL_COMMUNITY)
Admission: EM | Admit: 2023-12-20 | Discharge: 2023-12-22 | DRG: 280 | Disposition: A | Discharge status: Home / Self Care | Payer: MEDICAID | Attending: Family Medicine | Admitting: Family Medicine

## 2023-12-20 ENCOUNTER — Emergency Department (HOSPITAL_COMMUNITY): Payer: MEDICAID

## 2023-12-20 DIAGNOSIS — I5043 Acute on chronic combined systolic (congestive) and diastolic (congestive) heart failure: Secondary | ICD-10-CM | POA: Diagnosis present

## 2023-12-20 DIAGNOSIS — Z89412 Acquired absence of left great toe: Secondary | ICD-10-CM

## 2023-12-20 DIAGNOSIS — Z1152 Encounter for screening for COVID-19: Secondary | ICD-10-CM

## 2023-12-20 DIAGNOSIS — Z7901 Long term (current) use of anticoagulants: Secondary | ICD-10-CM | POA: Diagnosis not present

## 2023-12-20 DIAGNOSIS — F1410 Cocaine abuse, uncomplicated: Secondary | ICD-10-CM | POA: Diagnosis present

## 2023-12-20 DIAGNOSIS — I69351 Hemiplegia and hemiparesis following cerebral infarction affecting right dominant side: Secondary | ICD-10-CM

## 2023-12-20 DIAGNOSIS — Z89611 Acquired absence of right leg above knee: Secondary | ICD-10-CM | POA: Diagnosis not present

## 2023-12-20 DIAGNOSIS — J449 Chronic obstructive pulmonary disease, unspecified: Secondary | ICD-10-CM

## 2023-12-20 DIAGNOSIS — R0602 Shortness of breath: Principal | ICD-10-CM

## 2023-12-20 DIAGNOSIS — Z86718 Personal history of other venous thrombosis and embolism: Secondary | ICD-10-CM | POA: Diagnosis not present

## 2023-12-20 DIAGNOSIS — M79605 Pain in left leg: Secondary | ICD-10-CM

## 2023-12-20 DIAGNOSIS — J4489 Other specified chronic obstructive pulmonary disease: Secondary | ICD-10-CM | POA: Diagnosis present

## 2023-12-20 DIAGNOSIS — I4430 Unspecified atrioventricular block: Secondary | ICD-10-CM | POA: Diagnosis present

## 2023-12-20 DIAGNOSIS — F17210 Nicotine dependence, cigarettes, uncomplicated: Secondary | ICD-10-CM | POA: Diagnosis present

## 2023-12-20 DIAGNOSIS — Z79899 Other long term (current) drug therapy: Secondary | ICD-10-CM | POA: Diagnosis not present

## 2023-12-20 DIAGNOSIS — F250 Schizoaffective disorder, bipolar type: Secondary | ICD-10-CM | POA: Diagnosis not present

## 2023-12-20 DIAGNOSIS — G8929 Other chronic pain: Secondary | ICD-10-CM | POA: Diagnosis present

## 2023-12-20 DIAGNOSIS — I5023 Acute on chronic systolic (congestive) heart failure: Secondary | ICD-10-CM

## 2023-12-20 DIAGNOSIS — Z91010 Allergy to peanuts: Secondary | ICD-10-CM | POA: Diagnosis not present

## 2023-12-20 DIAGNOSIS — I110 Hypertensive heart disease with heart failure: Secondary | ICD-10-CM | POA: Diagnosis present

## 2023-12-20 DIAGNOSIS — Z813 Family history of other psychoactive substance abuse and dependence: Secondary | ICD-10-CM

## 2023-12-20 DIAGNOSIS — Z91148 Patient's other noncompliance with medication regimen for other reason: Secondary | ICD-10-CM | POA: Diagnosis not present

## 2023-12-20 DIAGNOSIS — E785 Hyperlipidemia, unspecified: Secondary | ICD-10-CM | POA: Diagnosis present

## 2023-12-20 DIAGNOSIS — Z888 Allergy status to other drugs, medicaments and biological substances status: Secondary | ICD-10-CM

## 2023-12-20 DIAGNOSIS — E119 Type 2 diabetes mellitus without complications: Secondary | ICD-10-CM

## 2023-12-20 DIAGNOSIS — Z89512 Acquired absence of left leg below knee: Secondary | ICD-10-CM

## 2023-12-20 DIAGNOSIS — E1165 Type 2 diabetes mellitus with hyperglycemia: Secondary | ICD-10-CM | POA: Diagnosis present

## 2023-12-20 DIAGNOSIS — Z86711 Personal history of pulmonary embolism: Secondary | ICD-10-CM

## 2023-12-20 DIAGNOSIS — I21A1 Myocardial infarction type 2: Secondary | ICD-10-CM | POA: Diagnosis present

## 2023-12-20 DIAGNOSIS — E1151 Type 2 diabetes mellitus with diabetic peripheral angiopathy without gangrene: Secondary | ICD-10-CM | POA: Diagnosis not present

## 2023-12-20 DIAGNOSIS — Z5900 Homelessness unspecified: Secondary | ICD-10-CM | POA: Diagnosis not present

## 2023-12-20 DIAGNOSIS — F259 Schizoaffective disorder, unspecified: Secondary | ICD-10-CM | POA: Diagnosis present

## 2023-12-20 DIAGNOSIS — R0902 Hypoxemia: Secondary | ICD-10-CM | POA: Diagnosis present

## 2023-12-20 LAB — RESP PANEL BY RT-PCR (RSV, FLU A&B, COVID)  RVPGX2
Influenza A by PCR: NEGATIVE
Influenza B by PCR: NEGATIVE
Resp Syncytial Virus by PCR: NEGATIVE
SARS Coronavirus 2 by RT PCR: NEGATIVE

## 2023-12-20 LAB — COMPREHENSIVE METABOLIC PANEL
ALT: 54 U/L — ABNORMAL HIGH (ref 0–44)
AST: 33 U/L (ref 15–41)
Albumin: 3.4 g/dL — ABNORMAL LOW (ref 3.5–5.0)
Alkaline Phosphatase: 77 U/L (ref 38–126)
Anion gap: 8 (ref 5–15)
BUN: 15 mg/dL (ref 6–20)
CO2: 21 mmol/L — ABNORMAL LOW (ref 22–32)
Calcium: 8.3 mg/dL — ABNORMAL LOW (ref 8.9–10.3)
Chloride: 110 mmol/L (ref 98–111)
Creatinine, Ser: 0.98 mg/dL (ref 0.61–1.24)
GFR, Estimated: >60 mL/min (ref >60)
Glucose, Bld: 84 mg/dL (ref 70–99)
Potassium: 3.5 mmol/L (ref 3.5–5.1)
Sodium: 139 mmol/L (ref 135–145)
Total Bilirubin: 1.1 mg/dL (ref 0.0–1.2)
Total Protein: 6 g/dL — ABNORMAL LOW (ref 6.5–8.1)

## 2023-12-20 LAB — GLUCOSE, CAPILLARY: Glucose-Capillary: 133 mg/dL — ABNORMAL HIGH (ref 70–99)

## 2023-12-20 LAB — CBC WITH DIFFERENTIAL/PLATELET
Abs Immature Granulocytes: 0.01 10*3/uL (ref 0.00–0.07)
Basophils Absolute: 0 10*3/uL (ref 0.0–0.1)
Basophils Relative: 1 %
Eosinophils Absolute: 0.1 10*3/uL (ref 0.0–0.5)
Eosinophils Relative: 1 %
HCT: 38.6 % — ABNORMAL LOW (ref 39.0–52.0)
Hemoglobin: 12.5 g/dL — ABNORMAL LOW (ref 13.0–17.0)
Immature Granulocytes: 0 %
Lymphocytes Relative: 52 %
Lymphs Abs: 2.5 10*3/uL (ref 0.7–4.0)
MCH: 32 pg (ref 26.0–34.0)
MCHC: 32.4 g/dL (ref 30.0–36.0)
MCV: 98.7 fL (ref 80.0–100.0)
Monocytes Absolute: 0.5 10*3/uL (ref 0.1–1.0)
Monocytes Relative: 10 %
Neutro Abs: 1.7 10*3/uL (ref 1.7–7.7)
Neutrophils Relative %: 36 %
Platelets: 213 10*3/uL (ref 150–400)
RBC: 3.91 MIL/uL — ABNORMAL LOW (ref 4.22–5.81)
RDW: 16.2 % — ABNORMAL HIGH (ref 11.5–15.5)
WBC: 4.8 10*3/uL (ref 4.0–10.5)
nRBC: 0 % (ref 0.0–0.2)

## 2023-12-20 LAB — BRAIN NATRIURETIC PEPTIDE: B Natriuretic Peptide: 1785.7 pg/mL — ABNORMAL HIGH (ref 0.0–100.0)

## 2023-12-20 LAB — TROPONIN I (HIGH SENSITIVITY)
Troponin I (High Sensitivity): 110 ng/L (ref <18)
Troponin I (High Sensitivity): 99 ng/L — ABNORMAL HIGH (ref <18)

## 2023-12-20 MED ORDER — ACETAMINOPHEN 650 MG RE SUPP: 650.0000 mg | Freq: Four times a day (QID) | RECTAL | Status: DC | PRN

## 2023-12-20 MED ORDER — INSULIN ASPART 100 UNIT/ML IJ SOLN
0.0000 [IU] | Freq: Every day | INTRAMUSCULAR | Status: DC
  Filled 2023-12-20: qty 0.05

## 2023-12-20 MED ORDER — SODIUM CHLORIDE 0.9% FLUSH
3.0000 mL | Freq: Two times a day (BID) | INTRAVENOUS | Status: DC
  Administered 2023-12-20 – 2023-12-22 (×2): 3 mL via INTRAVENOUS

## 2023-12-20 MED ORDER — SENNOSIDES-DOCUSATE SODIUM 8.6-50 MG PO TABS: 1.0000 | ORAL_TABLET | Freq: Every evening | ORAL | Status: DC | PRN

## 2023-12-20 MED ORDER — GABAPENTIN 300 MG PO CAPS: 900.0000 mg | ORAL_CAPSULE | Freq: Three times a day (TID) | ORAL | Status: DC

## 2023-12-20 MED ORDER — FUROSEMIDE 10 MG/ML IJ SOLN
60.0000 mg | Freq: Once | INTRAMUSCULAR | Status: AC
  Administered 2023-12-20: 60 mg via INTRAVENOUS
  Filled 2023-12-20: qty 8

## 2023-12-20 MED ORDER — INSULIN ASPART 100 UNIT/ML IJ SOLN
0.0000 [IU] | Freq: Three times a day (TID) | INTRAMUSCULAR | Status: DC
  Filled 2023-12-20: qty 0.06

## 2023-12-20 MED ORDER — BACLOFEN 10 MG PO TABS: 5.0000 mg | ORAL_TABLET | Freq: Three times a day (TID) | ORAL | Status: DC | PRN

## 2023-12-20 MED ORDER — ACETAMINOPHEN 325 MG PO TABS: 650.0000 mg | ORAL_TABLET | Freq: Four times a day (QID) | ORAL | Status: DC | PRN

## 2023-12-20 MED ORDER — ENOXAPARIN SODIUM 40 MG/0.4ML IJ SOSY
40.0000 mg | PREFILLED_SYRINGE | Freq: Every day | INTRAMUSCULAR | Status: DC
  Administered 2023-12-20: 40 mg via SUBCUTANEOUS
  Filled 2023-12-20 (×3): qty 0.4

## 2023-12-20 MED ORDER — OXYCODONE HCL 5 MG PO TABS
5.0000 mg | ORAL_TABLET | Freq: Once | ORAL | Status: AC
  Administered 2023-12-20: 5 mg via ORAL
  Filled 2023-12-20: qty 1

## 2023-12-20 MED ORDER — ALBUTEROL SULFATE HFA 108 (90 BASE) MCG/ACT IN AERS: 2.0000 | INHALATION_SPRAY | Freq: Four times a day (QID) | RESPIRATORY_TRACT | Status: DC | PRN

## 2023-12-20 MED ORDER — MORPHINE SULFATE (PF) 4 MG/ML IV SOLN
4.0000 mg | Freq: Once | INTRAVENOUS | Status: AC
  Administered 2023-12-20: 4 mg via INTRAVENOUS
  Filled 2023-12-20: qty 1

## 2023-12-20 MED ORDER — IPRATROPIUM-ALBUTEROL 0.5-2.5 (3) MG/3ML IN SOLN
3.0000 mL | Freq: Once | RESPIRATORY_TRACT | Status: AC
  Administered 2023-12-20: 3 mL via RESPIRATORY_TRACT
  Filled 2023-12-20: qty 3

## 2023-12-20 MED ORDER — OXYCODONE HCL 5 MG PO TABS
5.0000 mg | ORAL_TABLET | ORAL | Status: DC | PRN
  Administered 2023-12-20 – 2023-12-21 (×3): 10 mg via ORAL
  Administered 2023-12-21 (×2): 5 mg via ORAL
  Administered 2023-12-22 (×2): 10 mg via ORAL
  Filled 2023-12-20: qty 1
  Filled 2023-12-20: qty 2
  Filled 2023-12-20: qty 1
  Filled 2023-12-20 (×4): qty 2

## 2023-12-20 MED ORDER — FUROSEMIDE 10 MG/ML IJ SOLN
40.0000 mg | Freq: Two times a day (BID) | INTRAMUSCULAR | Status: DC
  Administered 2023-12-20 – 2023-12-21 (×3): 40 mg via INTRAVENOUS
  Filled 2023-12-20 (×4): qty 4

## 2023-12-20 MED ORDER — ALBUTEROL SULFATE (2.5 MG/3ML) 0.083% IN NEBU
2.5000 mg | INHALATION_SOLUTION | Freq: Four times a day (QID) | RESPIRATORY_TRACT | Status: DC | PRN
  Administered 2023-12-20 – 2023-12-21 (×3): 2.5 mg via RESPIRATORY_TRACT
  Filled 2023-12-20 (×4): qty 3

## 2023-12-20 NOTE — H&P (Addendum)
History and Physical    [name] ZOX:[id number] DOB: 1966/08/19 DOA: 12/20/2023  PCP: Georganna Skeans, MD   Patient coming from: Home  Chief Complaint: SOB   HPI: [name] is a 58 y.o. male with medical history significant for schizoaffective disorder, polysubstance abuse, homelessness, hypertension, type 2 diabetes mellitus, COPD, and chronic combined systolic and diastolic CHF who presents with shortness of breath.  Patient reports insidiously worsening SOB, and left leg swelling and pain.  He reports adherence with his medications. He denies chest pain or fevers.   ED Course: Upon arrival to the ED, patient is found to be afebrile and saturating well on room air with mild tachypnea and stable blood pressure.  Labs are most notable for normal renal function, normal WBC, troponin 110, and BNP 1796.  Chest x-ray notable for chronic lung disease and cardiomegaly.  Patient was treated with 60 mg IV Lasix, DuoNeb, oxycodone, and morphine in the ED.  Review of Systems:  All other systems reviewed and apart from HPI, are negative.  Past Medical History:  Diagnosis Date   Asthma    Bipolar 1 disorder (HCC)    CHF (congestive heart failure) (HCC)    COPD (chronic obstructive pulmonary disease) (HCC)    Depression    Diabetes mellitus without complication (HCC)    GERD (gastroesophageal reflux disease)    Homelessness    Hypertension    QT prolongation    Schizoaffective disorder (HCC)     Past Surgical History:  Procedure Laterality Date   AMPUTATION Left 12/10/2022   Procedure: LEFT BELOW KNEE AMPUTATION;  Surgeon: Nadara Mustard, MD;  Location: [facility] OR;  Service: Orthopedics;  Laterality: Left;   AMPUTATION TOE Bilateral 01/02/2021   Procedure: AMPUTATION TOE;  Surgeon: Park Liter, DPM;  Location: [facility] OR;  Service: Podiatry;  Laterality: Bilateral;  Ok to leave patient on hospital bed   IR ANGIO INTRA EXTRACRAN SEL COM CAROTID INNOMINATE BILAT MOD SED  12/17/2020   IR  ANGIO VERTEBRAL SEL VERTEBRAL BILAT MOD SED  12/17/2020   MULTIPLE EXTRACTIONS WITH ALVEOLOPLASTY N/A 03/25/2023   Procedure: IRRIGATION AND DEBRIDEMENT, MULTIPLE EXTRACTION WITH ALVEOLOPLASTY X 4 , EXTRACTIONS 4 THROUGH 13, 20 THROUGHT 29;  Surgeon: Vivia Ewing, DMD;  Location: [facility] OR;  Service: Oral Surgery;  Laterality: N/A;   RADIOLOGY WITH ANESTHESIA N/A 12/17/2020   Procedure: IR WITH ANESTHESIA;  Surgeon: Radiologist, Medication, MD;  Location: [facility] OR;  Service: Radiology;  Laterality: N/A;    Social History:   reports that he has been smoking cigarettes. He has never used smokeless tobacco. He reports current alcohol use. He reports current drug use. Drug: Cocaine.  Allergies  Allergen Reactions   Acetaminophen Hives, Nausea And Vomiting, Rash and Anaphylaxis    PT states that if he takes this he is going to the hospital   Motrin [Ibuprofen] Anaphylaxis   Nsaids Anaphylaxis    PT states that if he takes this he is going to the hospital   Lyrica Cr [Pregabalin Er] Other (See Comments)    Dystonia Dysarthria    Peanut-Containing Drug Products Other (See Comments)    Pt denies allergy to peanut butter 03/02/2021    Family History  Problem Relation Age of Onset   Drug abuse Mother    Suicidality Mother      Prior to Admission medications   Medication Sig Start Date End Date Taking? Authorizing Provider  Baclofen 5 MG TABS Take 1 tablet (5 mg total) by mouth 3 (three)  times daily as needed. 10/28/23   Georganna Skeans, MD  carvedilol (COREG) 12.5 MG tablet Take 1 tablet (12.5 mg total) by mouth 2 (two) times daily with a meal. 10/28/23 11/27/23  Georganna Skeans, MD  furosemide (LASIX) 20 MG tablet Take 1 tablet (20 mg total) by mouth daily. 10/28/23   Georganna Skeans, MD  gabapentin (NEURONTIN) 300 MG capsule Take 3 capsules (900 mg total) by mouth 3 (three) times daily. 10/28/23 11/27/23  Georganna Skeans, MD  oxyCODONE (OXY IR/ROXICODONE) 5 MG immediate release tablet Take 1 tablet (5  mg total) by mouth every 4 (four) hours as needed for severe pain. Patient not taking: Reported on 10/22/2023 06/16/23   Jerald Kief, MD  oxyCODONE (OXYCONTIN) 10 mg 12 hr tablet Take 2 tablets (20 mg total) by mouth every 12 (twelve) hours. Patient not taking: Reported on 10/22/2023 06/16/23   Jerald Kief, MD  VENTOLIN HFA 108 [phone] Base) MCG/ACT inhaler INHALE TWO PUFFS INTO THE LUNGS EVERY 6 (SIX) HOURS AS NEEDED FOR WHEEZING OR SHORTNESS OF BREATH 12/18/23   Georganna Skeans, MD    Physical Exam: Vitals:   12/20/23 0348 12/20/23 0500  BP: (!) 156/111 (!) 157/108  Pulse: 75 82  Resp: 13 (!) 21  Temp: 97.8 F (36.6 C)   TempSrc: Oral   SpO2: 98% 96%    Constitutional: NAD, no pallor or diaphoresis  Eyes: PERTLA, lids and conjunctivae normal ENMT: Mucous membranes are moist. Posterior pharynx clear of any exudate or lesions.   Neck: supple, no masses  Respiratory: Labored breathing. Dyspneic with rest. No wheezing.  Cardiovascular: S1 & S2 heard, regular rate and rhythm. Extremity edema. JVD. Abdomen: No distension, no tenderness, soft. Bowel sounds active.  Musculoskeletal: no clubbing / cyanosis. S/p Left BKA and Right AKA.    Skin: no significant rashes, lesions, ulcers. Warm, dry, well-perfused. Neurologic: CN 2-12 grossly intact. Moving all extremities. Alert and oriented.  Psychiatric: Calm. Cooperative.    Labs and Imaging on Admission: I have personally reviewed following labs and imaging studies  CBC: Recent Labs  Lab 12/20/23 0436  WBC 4.8  NEUTROABS 1.7  HGB 12.5*  HCT 38.6*  MCV 98.7  PLT 213   Basic Metabolic Panel: Recent Labs  Lab 12/20/23 0436  NA 139  K 3.5  CL 110  CO2 21*  GLUCOSE 84  BUN 15  CREATININE 0.98  CALCIUM 8.3*   GFR: CrCl cannot be calculated (Unknown ideal weight.). Liver Function Tests: Recent Labs  Lab 12/20/23 0436  AST 33  ALT 54*  ALKPHOS 77  BILITOT 1.1  PROT 6.0*  ALBUMIN 3.4*   No results for input(s):  "LIPASE", "AMYLASE" in the last 168 hours. No results for input(s): "AMMONIA" in the last 168 hours. Coagulation Profile: No results for input(s): "INR", "PROTIME" in the last 168 hours. Cardiac Enzymes: No results for input(s): "CKTOTAL", "CKMB", "CKMBINDEX", "TROPONINI" in the last 168 hours. BNP (last 3 results) No results for input(s): "PROBNP" in the last 8760 hours. HbA1C: No results for input(s): "HGBA1C" in the last 72 hours. CBG: No results for input(s): "GLUCAP" in the last 168 hours. Lipid Profile: No results for input(s): "CHOL", "HDL", "LDLCALC", "TRIG", "CHOLHDL", "LDLDIRECT" in the last 72 hours. Thyroid Function Tests: No results for input(s): "TSH", "T4TOTAL", "FREET4", "T3FREE", "THYROIDAB" in the last 72 hours. Anemia Panel: No results for input(s): "VITAMINB12", "FOLATE", "FERRITIN", "TIBC", "IRON", "RETICCTPCT" in the last 72 hours. Urine analysis:    Component Value Date/Time   COLORURINE YELLOW  08/17/2022 0625   APPEARANCEUR CLEAR 08/17/2022 0625   LABSPEC 1.021 08/17/2022 0625   PHURINE 5.0 08/17/2022 0625   GLUCOSEU NEGATIVE 08/17/2022 0625   HGBUR NEGATIVE 08/17/2022 0625   BILIRUBINUR NEGATIVE 08/17/2022 0625   KETONESUR NEGATIVE 08/17/2022 0625   PROTEINUR NEGATIVE 08/17/2022 0625   NITRITE NEGATIVE 08/17/2022 0625   LEUKOCYTESUR NEGATIVE 08/17/2022 0625   Sepsis Labs: @LABRCNTIP (procalcitonin:4,lacticidven:4) ) Recent Results (from the past 240 hours)  Resp panel by RT-PCR (RSV, Flu A&B, Covid) Anterior Nasal Swab     Status: None   Collection Time: 12/20/23  3:55 AM   Specimen: Anterior Nasal Swab  Result Value Ref Range Status   SARS Coronavirus 2 by RT PCR NEGATIVE NEGATIVE Final    Comment: (NOTE) SARS-CoV-2 target nucleic acids are NOT DETECTED.  The SARS-CoV-2 RNA is generally detectable in upper respiratory specimens during the acute phase of infection. The lowest concentration of SARS-CoV-2 viral copies this assay can detect is 138  copies/mL. A negative result does not preclude SARS-Cov-2 infection and should not be used as the sole basis for treatment or other patient management decisions. A negative result may occur with  improper specimen collection/handling, submission of specimen other than nasopharyngeal swab, presence of viral mutation(s) within the areas targeted by this assay, and inadequate number of viral copies(<138 copies/mL). A negative result must be combined with clinical observations, patient history, and epidemiological information. The expected result is Negative.  Fact Sheet for Patients:  BloggerCourse.com  Fact Sheet for Healthcare Providers:  SeriousBroker.it  This test is no t yet approved or cleared by the [place] FDA and  has been authorized for detection and/or diagnosis of SARS-CoV-2 by FDA under an Emergency Use Authorization (EUA). This EUA will remain  in effect (meaning this test can be used) for the duration of the COVID-19 declaration under Section 564(b)(1) of the Act, 21 U.S.C.section 360bbb-3(b)(1), unless the authorization is terminated  or revoked sooner.       Influenza A by PCR NEGATIVE NEGATIVE Final   Influenza B by PCR NEGATIVE NEGATIVE Final    Comment: (NOTE) The Xpert Xpress SARS-CoV-2/FLU/RSV plus assay is intended as an aid in the diagnosis of influenza from Nasopharyngeal swab specimens and should not be used as a sole basis for treatment. Nasal washings and aspirates are unacceptable for Xpert Xpress SARS-CoV-2/FLU/RSV testing.  Fact Sheet for Patients: BloggerCourse.com  Fact Sheet for Healthcare Providers: SeriousBroker.it  This test is not yet approved or cleared by the [place] FDA and has been authorized for detection and/or diagnosis of SARS-CoV-2 by FDA under an Emergency Use Authorization (EUA). This EUA will remain in effect (meaning  this test can be used) for the duration of the COVID-19 declaration under Section 564(b)(1) of the Act, 21 U.S.C. section 360bbb-3(b)(1), unless the authorization is terminated or revoked.     Resp Syncytial Virus by PCR NEGATIVE NEGATIVE Final    Comment: (NOTE) Fact Sheet for Patients: BloggerCourse.com  Fact Sheet for Healthcare Providers: SeriousBroker.it  This test is not yet approved or cleared by the [place] FDA and has been authorized for detection and/or diagnosis of SARS-CoV-2 by FDA under an Emergency Use Authorization (EUA). This EUA will remain in effect (meaning this test can be used) for the duration of the COVID-19 declaration under Section 564(b)(1) of the Act, 21 U.S.C. section 360bbb-3(b)(1), unless the authorization is terminated or revoked.  Performed at [facility], [address], [place], [name] [address]  Radiological Exams on Admission: DG Chest Portable 1 View Result Date: 12/20/2023 CLINICAL DATA:  Shortness of breath EXAM: PORTABLE CHEST 1 VIEW COMPARISON:  02/13/2023 FINDINGS: Cardiopericardial enlargement. Aortic and hilar contours are unremarkable. Interstitial coarsening which is baseline, there is emphysema by recent chest CT. Mild hazy density in the right mid lung is stable from CT. There is no edema, consolidation, effusion, or pneumothorax. IMPRESSION: 1. Stable from recent chest CT. 2. Chronic lung disease and cardiomegaly. Electronically Signed   By: Tiburcio Pea M.D.   On: 12/20/2023 04:28    EKG: Independently reviewed. Sinus rhythm, 1st AV block, T-wave inversions appear similar to prior.   Assessment/Plan   1. Acute on chronic combined systolic & diastolic CHF  - EF <20% with global hypokinesis and grade 2 diastolic dysfunction on TTE from August 2023  - Continue diuresis with IV Lasix, monitor weight and I/Os, monitor renal function and electrolytes     2. Elevated troponin  - No chest pain, EKG appears similar to prior, likely demand ischemia in setting of acute CHF  - Continue cardiac monitoring, trend troponin    3. LLE pain and swelling - He has hx of DVT and PE, no longer on anticoagulant - Check venous Doppler    4. COPD  - Not in exacerbation  - Albuterol as needed   5. Schizoaffective disorder  - No longer on medications for this, outpatient follow-up recommended   6. Diabetes mellitus  - A1c was 11.2% in September 2024 - Not currently on medications for this, will check CBGs and use low-intensity SSI for now    DVT prophylaxis: Lovenox  Code Status: Full  Level of Care: Level of care: Telemetry Family Communication: None present   Disposition Plan:  Patient is from: home  Anticipated d/c is to: home  Anticipated d/c date is: 12/22/23  Patient currently: Pending repeat troponin, improved volume status Consults called: None  Admission status: Inpatient     Briscoe Deutscher, MD [facility]  12/20/2023, 6:01 AM

## 2023-12-20 NOTE — Progress Notes (Signed)
TR[facility] ROUNDING   NOTE [name] [id number]  DOB: [birth year]  DOA: 12/20/2023  PCP: Georganna Skeans, MD  12/20/2023,8:29 AM   LOS: 0 days      Code Status: Full code From: Homeless current Dispo: Unclear   58 year old black male with known schizoaffective disorder polysubstance abuse HTN HLD DM TY 2 COPD  combined diastolic systolic heart failure based on echo 07/2022 Prior CVA with right hemiparesis Right AKA in the past left great toe amputation Prior thromboembolism noncompliant on anticoagulation previously Polysubstance abuse cocaine Previous extended hospitalization 12/20/2022 through 06/20/2023 where he had multiple teeth abscesses removed and gangrene of the left foot he also had left BKA at that time 12/10/2022 and he was taken off of his anticoagulation at that time Previously seen 10/23/2023 in the ED with SI ideation spent several days there-and was cleared by psychiatry and sent home with Vraylar  Events 12/19/2022 came to emergency room with shortness of breath 2 days of symptoms, left leg swelling pain not necessarily compliant with his Lasix?  Sodium 139 potassium 3.5 BUN/creatinine 15/0.9 BNP 1785-troponin 110 Cycrin to 99 WBC 4.8, hemoglobin 12.5, platelet 213 CXR stable chest x-ray recent cardiomegaly    procedures     Plan  Acute exacerbation HFrEF/HFpEF-last echo EF less than 20% NSTEMI demand ischemia Slightly volume overloaded on exam but seems to be on room air currently-given Lasix 40 IV twice daily-as he looks relatively comfortable we will transition to p.o. 40 twice daily Resume 0.5 twice daily if remains euvolemic EKG shows T wave inversions in V4 through V6 but these have been chronic and he has no chest pain so I think this is type II ischemia Bilateral amputations Resume baclofen 5 3 times daily as needed, gabapentin 900 3 times daily,--long-term opiates apparently have been discontinued in the outpatient setting Apparently now does have housing etc.  and should be followed in the outpatient setting Prior DVT/thromboembolism now not on anticoagulation At risk for further clot COPD No wheeze today no real symptoms of SOB-would hold inhalers at this time HTN HLD See above discussion Uncontrolled DM TY 2 refuses management Patient refused blood sugar checks and said that he did not have diabetes We will try to educate below for-blood sugars are ranging from 130s to 180s so may be able to do without the same Prior polysubstance abuse with cocaine Schizoaffective SI Previously was on Vraylar-May need psych input if mentation worsens or if becomes floridly schizophrenic or psychotic Does not require sitter  DVT prophylaxis: Lovenox  Status is: Inpatient Remains inpatient appropriate because:   Requires some diuresis may be able to go home in 1 to 2 days  Subjective:  No fever no chills no nausea no vomiting Looks fair feels okay Main concerns left lower extremity pain  Objective + exam Vitals:   12/20/23 0348 12/20/23 0500 12/20/23 0800  BP: (!) 156/111 (!) 157/108 (!) 145/103  Pulse: 75 82 72  Resp: 13 (!) 21 (!) 26  Temp: 97.8 F (36.6 C)  97.6 F (36.4 C)  TempSrc: Oral  Oral  SpO2: 98% 96% (!) 85%   There were no vitals filed for this visit.  Examination: Awake distractible black male no distress S1-S2 no murmur no rub no gallop Chest clear no wheeze rales rhonchi Abdomen soft Left BKA looks relatively clean has not eaten seems a little bit agitated  Data Reviewed: reviewed   CBC    Component Value Date/Time   WBC 4.8 12/20/2023 0436   RBC 3.91 (  L) 12/20/2023 0436   HGB 12.5 (L) 12/20/2023 0436   HCT 38.6 (L) 12/20/2023 0436   PLT 213 12/20/2023 0436   MCV 98.7 12/20/2023 0436   MCH 32.0 12/20/2023 0436   MCHC 32.4 12/20/2023 0436   RDW 16.2 (H) 12/20/2023 0436   LYMPHSABS 2.5 12/20/2023 0436   MONOABS 0.5 12/20/2023 0436   EOSABS 0.1 12/20/2023 0436   BASOSABS 0.0 12/20/2023 0436      Latest Ref  Rng & Units 12/20/2023    4:36 AM 10/22/2023    4:43 PM 06/13/2023    2:43 PM  CMP  Glucose 70 - 99 mg/dL 84  782  956   BUN 6 - 20 mg/dL 15  24  16    Creatinine 0.61 - 1.24 mg/dL 2.13  0.86  5.78   Sodium 135 - 145 mmol/L 139  136  139   Potassium 3.5 - 5.1 mmol/L 3.5  3.6  3.7   Chloride 98 - 111 mmol/L 110  103  104   CO2 22 - 32 mmol/L 21  22  22    Calcium 8.9 - 10.3 mg/dL 8.3  9.0  9.0   Total Protein 6.5 - 8.1 g/dL 6.0  7.1  7.4   Total Bilirubin 0.0 - 1.2 mg/dL 1.1  1.5  0.3   Alkaline Phos 38 - 126 U/L 77  68  72   AST 15 - 41 U/L 33  27  19   ALT 0 - 44 U/L 54  26  19     Scheduled Meds:  enoxaparin (LOVENOX) injection  40 mg Subcutaneous Daily   furosemide  40 mg Intravenous Q12H   insulin aspart  0-5 Units Subcutaneous QHS   insulin aspart  0-6 Units Subcutaneous TID WC   sodium chloride flush  3 mL Intravenous Q12H   Continuous Infusions:  Time 44  Rhetta Mura, MD  [facility]

## 2023-12-20 NOTE — ED Provider Notes (Signed)
Emergency Department Provider Note   I have reviewed the triage vital signs and the nursing notes.   HISTORY  Chief Complaint Shortness of Breath   [name] is a 58 y.o. male past history of asthma, CHF, COPD, diabetes presents to the emergency department with shortness of breath.  Has had 2 days of symptoms.  Has been taking his albuterol at home with no relief.  He has chronic pain in his leg and no.  Pliant with his home medications including Lasix.  Past Medical History:  Diagnosis Date   Asthma    Bipolar 1 disorder (HCC)    CHF (congestive heart failure) (HCC)    COPD (chronic obstructive pulmonary disease) (HCC)    Depression    Diabetes mellitus without complication (HCC)    GERD (gastroesophageal reflux disease)    Homelessness    Hypertension    QT prolongation    Schizoaffective disorder (HCC)     Review of Systems  Constitutional: No fever/chills Cardiovascular: Denies chest pain. Respiratory: Positive shortness of breath. Gastrointestinal: No abdominal pain.  No nausea, no vomiting. Musculoskeletal: Negative for back pain. Skin: Negative for rash. Neurological: Negative for headaches.  ____________________________________________   PHYSICAL EXAM:  VITAL SIGNS: ED Triage Vitals  Encounter Vitals Group     BP 12/20/23 0348 (!) 156/111     Pulse Rate 12/20/23 0348 75     Resp 12/20/23 0348 13     Temp 12/20/23 0348 97.8 F (36.6 C)     Temp Source 12/20/23 0348 Oral     SpO2 12/20/23 0348 98 %   Constitutional: Alert and oriented. Well appearing and in no acute distress. Eyes: Conjunctivae are normal.  Head: Atraumatic. Nose: No congestion/rhinnorhea. Mouth/Throat: Mucous membranes are moist.  Neck: No stridor.   Cardiovascular: Normal rate, regular rhythm. Good peripheral circulation. Grossly normal heart sounds.   Respiratory: Increased respiratory effort.  No retractions. Lungs with end-expiratory wheezing.  Gastrointestinal:  Soft and nontender. No distention.  Musculoskeletal: No lower extremity tenderness nor edema. No gross deformities of extremities. Neurologic:  Normal speech and language. No gross focal neurologic deficits are appreciated.  Skin:  Skin is warm, dry and intact. No rash noted.  ____________________________________________   LABS (all labs ordered are listed, but only abnormal results are displayed)  Labs Reviewed  COMPREHENSIVE METABOLIC PANEL - Abnormal; Notable for the following components:      Result Value   CO2 21 (*)    Calcium 8.3 (*)    Total Protein 6.0 (*)    Albumin 3.4 (*)    ALT 54 (*)    All other components within normal limits  BRAIN NATRIURETIC PEPTIDE - Abnormal; Notable for the following components:   B Natriuretic Peptide 1,785.7 (*)    All other components within normal limits  CBC WITH DIFFERENTIAL/PLATELET - Abnormal; Notable for the following components:   RBC 3.91 (*)    Hemoglobin 12.5 (*)    HCT 38.6 (*)    RDW 16.2 (*)    All other components within normal limits  GLUCOSE, CAPILLARY - Abnormal; Notable for the following components:   Glucose-Capillary 133 (*)    All other components within normal limits  BASIC METABOLIC PANEL - Abnormal; Notable for the following components:   Calcium 8.7 (*)    All other components within normal limits  CBC - Abnormal; Notable for the following components:   RBC 4.04 (*)    RDW 15.9 (*)    All other components  within normal limits  TROPONIN I (HIGH SENSITIVITY) - Abnormal; Notable for the following components:   Troponin I (High Sensitivity) 110 (*)    All other components within normal limits  TROPONIN I (HIGH SENSITIVITY) - Abnormal; Notable for the following components:   Troponin I (High Sensitivity) 99 (*)    All other components within normal limits  RESP PANEL BY RT-PCR (RSV, FLU A&B, COVID)  RVPGX2  GLUCOSE, CAPILLARY  GLUCOSE, CAPILLARY   ____________________________________________  EKG   EKG  Interpretation Date/Time:  Sunday December 20 2023 04:17:52 EST Ventricular Rate:  78 PR Interval:  203 QRS Duration:  107 QT Interval:  424 QTC Calculation: 483 R Axis:   18  Text Interpretation: Sinus rhythm Borderline prolonged PR interval Abnrm T, consider ischemia, anterolateral lds Similar to Nov 2024 tracing Confirmed by Alona Bene [phone]) on 12/20/2023 4:26:08 AM        ____________________________________________  RADIOLOGY  DG Chest Portable 1 View Result Date: 12/20/2023 CLINICAL DATA:  Shortness of breath EXAM: PORTABLE CHEST 1 VIEW COMPARISON:  02/13/2023 FINDINGS: Cardiopericardial enlargement. Aortic and hilar contours are unremarkable. Interstitial coarsening which is baseline, there is emphysema by recent chest CT. Mild hazy density in the right mid lung is stable from CT. There is no edema, consolidation, effusion, or pneumothorax. IMPRESSION: 1. Stable from recent chest CT. 2. Chronic lung disease and cardiomegaly. Electronically Signed   By: Tiburcio Pea M.D.   On: 12/20/2023 04:28    ____________________________________________   PROCEDURES  Procedure(s) performed:   Procedures  CRITICAL CARE Performed by: Maia Plan Total critical care time: 35 minutes Critical care time was exclusive of separately billable procedures and treating other patients. Critical care was necessary to treat or prevent imminent or life-threatening deterioration. Critical care was time spent personally by me on the following activities: development of treatment plan with patient and/or surrogate as well as nursing, discussions with consultants, evaluation of patient's response to treatment, examination of patient, obtaining history from patient or surrogate, ordering and performing treatments and interventions, ordering and review of laboratory studies, ordering and review of radiographic studies, pulse oximetry and re-evaluation of patient's condition.  Alona Bene,  MD Emergency Medicine  ____________________________________________   INITIAL IMPRESSION / ASSESSMENT AND PLAN / ED COURSE  Pertinent labs & imaging results that were available during my care of the patient were reviewed by me and considered in my medical decision making (see chart for details).   This patient is Presenting for Evaluation of SOB, which does require a range of treatment options, and is a complaint that involves a high risk of morbidity and mortality.  The Differential Diagnoses include COVID, Flu, RSV, CHF, ACS, PE, etc.  Critical Interventions-    Medications  ipratropium-albuterol (DUONEB) 0.5-2.5 (3) MG/3ML nebulizer solution 3 mL (3 mLs Nebulization Given 12/20/23 0424)  oxyCODONE (Oxy IR/ROXICODONE) immediate release tablet 5 mg (5 mg Oral Given 12/20/23 0502)  furosemide (LASIX) injection 60 mg (60 mg Intravenous Given 12/20/23 0544)  morphine (PF) 4 MG/ML injection 4 mg (4 mg Intravenous Given 12/20/23 0645)    Reassessment after intervention: symptoms improved.   Clinical Laboratory Tests Ordered, included CBC without leukocytosis. BNP and troponin elevated.   Radiologic Tests Ordered, included CXR. I independently interpreted the images and agree with radiology interpretation.   Cardiac Monitor Tracing which shows NSR.    Social Determinants of Health Risk patient is a smoker.   Consult complete with [facility]. Plan for admit.   Medical Decision Making: Summary:  Presents to the emergency department with shortness of breath.  Wheezing on exam favoring COPD as the etiology for symptoms.  Not appearing volume overloaded.   Reevaluation with update and discussion with patient. Discussed admit. He is in agreement.   Patient's presentation is most consistent with acute presentation with potential threat to life or bodily function.   Disposition: admit  ____________________________________________  FINAL CLINICAL IMPRESSION(S) / ED DIAGNOSES  Final diagnoses:   SOB (shortness of breath)     NEW OUTPATIENT MEDICATIONS STARTED DURING THIS VISIT:  Discharge Medication List as of 12/22/2023 11:12 AM     START taking these medications   Details  ARIPiprazole (ABILIFY) 5 MG tablet Take 1 tablet (5 mg total) by mouth daily., Starting Wed 12/23/2023, Normal    hydrOXYzine (ATARAX) 10 MG tablet Take 1 tablet (10 mg total) by mouth 3 (three) times daily as needed for anxiety., Starting Tue 12/22/2023, Normal        Note:  This document was prepared using Dragon voice recognition software and may include unintentional dictation errors.  Alona Bene, MD, [facility] Emergency Medicine    Ivory Bail, Arlyss Repress, MD 12/24/23 0700

## 2023-12-20 NOTE — ED Triage Notes (Signed)
Pt coming from home c/o Shob , that he's had for a couple of days. States taking albuterol with no relief. Unable to say how much he took. Denies any CP, N/V/D or fevers. States having pain to his left leg 9/10. Hx: COPD, asthma, HTN, DM, CHF.   BP 148 palpated, HR 72, RR 18,

## 2023-12-20 NOTE — ED Notes (Signed)
When I went in to get patients temperature he stated that was the last time that he was going to let me take his temperature.

## 2023-12-20 NOTE — Progress Notes (Signed)
LLE venous duplex (limited) has been completed.   Results can be found under chart review under CV PROC. 12/20/2023 1:55 PM Keionte Swicegood RVT, RDMS

## 2023-12-20 NOTE — ED Notes (Signed)
ED TO INPATIENT HANDOFF REPORT  Name/Age/Gender [name] 58 y.o. male  Code Status    Code Status Orders  (From admission, onward)           Start     Ordered   12/20/23 0559  Full code  Continuous       Question:  By:  Answer:  Consent: discussion documented in EHR   12/20/23 0601           Code Status History     Date Active Date Inactive Code Status Order ID Comments User Context   10/22/2023 1835 10/24/2023 1159 Full Code [id number]  Loetta Rough, MD ED   12/07/2022 1712 06/16/2023 1820 Full Code [id number]  Teddy Spike, DO ED   11/24/2022 0745 12/03/2022 1902 Full Code [id number]  Bobette Mo, MD ED   11/03/2022 0253 11/03/2022 1937 Full Code [id number]  Garlon Hatchet, PA-C ED   08/17/2022 0142 10/09/2022 2035 Full Code [id number]  Lurline Del, MD ED   07/12/2022 1026 07/25/2022 2113 Full Code [id number]  Bobette Mo, MD ED   04/04/2022 1240 04/11/2022 1710 Full Code [id number]  Bobette Mo, MD ED   04/03/2022 1411 04/04/2022 1240 Full Code [id number]  Darrick Grinder, PA-C ED   03/23/2022 2112 03/31/2022 1517 Full Code [id number]  Zigmund Daniel., MD Inpatient   02/26/2022 1812 03/07/2022 1908 Full Code [id number]  Teddy Spike, DO Inpatient   01/29/2022 1515 02/05/2022 1533 Full Code [id number]  Orland Mustard, MD ED   12/03/2021 0841 12/09/2021 1457 Full Code [id number]  Marinda Elk, MD ED   11/24/2021 2315 11/25/2021 2004 Full Code [id number]  Muthersbaugh, Boyd Kerbs ED   11/13/2021 1653 11/21/2021 1748 Full Code [id number]  Bobette Mo, MD Inpatient   11/02/2021 2000 11/04/2021 2331 Full Code [id number]  Derwood Kaplan, MD ED   07/03/2021 1143 07/05/2021 0057 Full Code [id number]  Ernie Avena, MD ED   03/02/2021 1936 03/12/2021 1811 Full Code [id number]  Charlsie Quest, MD ED   02/10/2021 2345 02/13/2021 1958 Full Code [id number]  Garlon Hatchet, PA-C ED   02/09/2021 1953 02/10/2021 1536 Full Code [id number]  Koleen Distance, MD ED    12/17/2020 1516 02/03/2021 1650 Full Code [id number]  Leda Gauze, NP ED   12/08/2020 1336 12/10/2020 1931 Full Code [id number]  Maury Dus, MD ED   11/13/2020 2028 11/16/2020 1520 Full Code [id number]  Teddy Spike, DO ED   10/26/2020 1039 10/31/2020 2157 Full Code [id number]  Jae Dire, MD ED   10/15/2020 0611 10/18/2020 1810 Full Code [id number]  Chotiner, Claudean Severance, MD Inpatient   10/02/2020 2247 10/07/2020 1809 Full Code [id number]  Briscoe Deutscher, MD ED   09/25/2020 1559 09/26/2020 2002 Full Code [id number]  Gertha Calkin, MD ED   09/09/2020 1251 09/11/2020 1546 Full Code [id number]  Noralee Stain, DO Inpatient   09/07/2020 1753 09/09/2020 1251 DNR [id number]  Teddy Spike, DO Inpatient   07/21/2020 1700 07/23/2020 2001 Full Code [id number]  Theotis Barrio, MD ED   07/07/2020 1529 07/07/2020 2109 Full Code [id number]  Terald Sleeper, MD ED   06/24/2019 0131 06/24/2019 1630 Full Code [id number]  Jackelyn Poling, NP Inpatient   06/24/2019 0130 06/24/2019 0131 Full Code [id number]  Denzil Magnuson, NP Inpatient   06/21/2019 0941 06/22/2019 2007 Full Code [id number]  Ellwood Dense, DO Inpatient   05/24/2019 1117 05/26/2019 1836 Full Code [id number]  Howard Pouch, MD  ED   04/18/2019 0149 04/19/2019 1628 DNR [id number]  Levora Dredge, MD ED   04/04/2019 0555 04/06/2019 1413 Full Code [id number]  Lorenso Courier, MD ED   03/27/2019 2130 03/29/2019 1900 Full Code [id number]  Theotis Barrio, MD ED   03/04/2019 1600 03/09/2019 1222 Full Code [id number]  Eulah Pont, MD ED   02/13/2018 0439 02/14/2018 1333 Full Code [id number]  Glynn Octave, MD ED   12/26/2016 0547 12/29/2016 2021 Full Code [id number]  Clapacs, Jackquline Denmark, MD Inpatient   11/27/2016 1541 11/28/2016 1319 Full Code [id number]  Charlestine Night, PA-C ED   10/29/2016 1415 10/31/2016 1150 Full Code [id number]  Benjiman Core, MD ED   09/06/2016 2230 09/07/2016 1350 Full Code [id number]  Doug Sou, MD ED   09/06/2016 0934 09/06/2016 1529 Full Code [id number]   Abelino Derrick, MD ED   09/02/2016 0045 09/04/2016 2127 Full Code [id number]  Earley Favor, NP ED   07/30/2016 1357 08/06/2016 2031 Full Code [id number]  Truman Hayward, FNP Inpatient   07/30/2016 0143 07/30/2016 1153 Full Code [id number]  [name], MD ED   05/30/2016 1622 06/05/2016 1454 Full Code [id number]  Thermon Leyland, NP Inpatient   05/29/2016 1635 05/30/2016 1622 Full Code [id number]  Melene Plan, DO ED   12/02/2011 0245 12/12/2011 0720 Full Code [id number]  Renne Crigler, PA ED       Home/SNF/Other Home  Chief Complaint Acute on chronic HFrEF (heart failure with reduced ejection fraction) (HCC) [I50.23]  Level of Care/Admitting Diagnosis ED Disposition     ED Disposition  Admit   Condition  --   Comment  Hospital Area: [facility] [[id number]]  Level of Care: Telemetry [5]  Admit to tele based on following criteria: Monitor for Ischemic changes  May admit patient to [facility] or [facility] if equivalent level of care is available:: Yes  Covid Evaluation: Confirmed COVID Negative  Diagnosis: Acute on chronic HFrEF (heart failure with reduced ejection fraction) [facility]) [[id number]]  Admitting Physician: Briscoe Deutscher [[id number]]  Attending Physician: Briscoe Deutscher [[id number]]  Certification:: I certify this patient will need inpatient services for at least 2 midnights  Expected Medical Readiness: 12/23/2023          Medical History Past Medical History:  Diagnosis Date   Asthma    Bipolar 1 disorder (HCC)    CHF (congestive heart failure) (HCC)    COPD (chronic obstructive pulmonary disease) (HCC)    Depression    Diabetes mellitus without complication (HCC)    GERD (gastroesophageal reflux disease)    Homelessness    Hypertension    QT prolongation    Schizoaffective disorder (HCC)     Allergies Allergies  Allergen Reactions   Acetaminophen Hives, Nausea And Vomiting, Rash and Anaphylaxis    PT states that if he takes this he is going  to the hospital   Motrin [Ibuprofen] Anaphylaxis   Nsaids Anaphylaxis    PT states that if he takes this he is going to the hospital   Lyrica Cr [Pregabalin Er] Other (See Comments)    Dystonia Dysarthria    Peanut-Containing Drug Products Other (See Comments)    Pt denies allergy to peanut butter 03/02/2021    IV Location/Drains/Wounds Patient Lines/Drains/Airways Status     Active Line/Drains/Airways     Name Placement date Placement time Site Days   Peripheral IV 12/20/23 20 G Anterior;Left;Proximal Forearm 12/20/23  0430  Forearm  less than 1  Labs/Imaging Results for orders placed or performed during the hospital encounter of 12/20/23 (from the past 48 hours)  Resp panel by RT-PCR (RSV, Flu A&B, Covid) Anterior Nasal Swab     Status: None   Collection Time: 12/20/23  3:55 AM   Specimen: Anterior Nasal Swab  Result Value Ref Range   SARS Coronavirus 2 by RT PCR NEGATIVE NEGATIVE    Comment: (NOTE) SARS-CoV-2 target nucleic acids are NOT DETECTED.  The SARS-CoV-2 RNA is generally detectable in upper respiratory specimens during the acute phase of infection. The lowest concentration of SARS-CoV-2 viral copies this assay can detect is 138 copies/mL. A negative result does not preclude SARS-Cov-2 infection and should not be used as the sole basis for treatment or other patient management decisions. A negative result may occur with  improper specimen collection/handling, submission of specimen other than nasopharyngeal swab, presence of viral mutation(s) within the areas targeted by this assay, and inadequate number of viral copies(<138 copies/mL). A negative result must be combined with clinical observations, patient history, and epidemiological information. The expected result is Negative.  Fact Sheet for Patients:  BloggerCourse.com  Fact Sheet for Healthcare Providers:  SeriousBroker.it  This test is no  t yet approved or cleared by the [place] FDA and  has been authorized for detection and/or diagnosis of SARS-CoV-2 by FDA under an Emergency Use Authorization (EUA). This EUA will remain  in effect (meaning this test can be used) for the duration of the COVID-19 declaration under Section 564(b)(1) of the Act, 21 U.S.C.section 360bbb-3(b)(1), unless the authorization is terminated  or revoked sooner.       Influenza A by PCR NEGATIVE NEGATIVE   Influenza B by PCR NEGATIVE NEGATIVE    Comment: (NOTE) The Xpert Xpress SARS-CoV-2/FLU/RSV plus assay is intended as an aid in the diagnosis of influenza from Nasopharyngeal swab specimens and should not be used as a sole basis for treatment. Nasal washings and aspirates are unacceptable for Xpert Xpress SARS-CoV-2/FLU/RSV testing.  Fact Sheet for Patients: BloggerCourse.com  Fact Sheet for Healthcare Providers: SeriousBroker.it  This test is not yet approved or cleared by the [place] FDA and has been authorized for detection and/or diagnosis of SARS-CoV-2 by FDA under an Emergency Use Authorization (EUA). This EUA will remain in effect (meaning this test can be used) for the duration of the COVID-19 declaration under Section 564(b)(1) of the Act, 21 U.S.C. section 360bbb-3(b)(1), unless the authorization is terminated or revoked.     Resp Syncytial Virus by PCR NEGATIVE NEGATIVE    Comment: (NOTE) Fact Sheet for Patients: BloggerCourse.com  Fact Sheet for Healthcare Providers: SeriousBroker.it  This test is not yet approved or cleared by the [place] FDA and has been authorized for detection and/or diagnosis of SARS-CoV-2 by FDA under an Emergency Use Authorization (EUA). This EUA will remain in effect (meaning this test can be used) for the duration of the COVID-19 declaration under Section 564(b)(1) of the Act, 21  U.S.C. section 360bbb-3(b)(1), unless the authorization is terminated or revoked.  Performed at [facility], [address], [place], [name] [address]   Comprehensive metabolic panel     Status: Abnormal   Collection Time: 12/20/23  4:36 AM  Result Value Ref Range   Sodium 139 135 - 145 mmol/L   Potassium 3.5 3.5 - 5.1 mmol/L   Chloride 110 98 - 111 mmol/L   CO2 21 (L) 22 - 32 mmol/L   Glucose, Bld 84 70 - 99 mg/dL  Comment: Glucose reference range applies only to samples taken after fasting for at least 8 hours.   BUN 15 6 - 20 mg/dL   Creatinine, Ser 4.09 0.61 - 1.24 mg/dL   Calcium 8.3 (L) 8.9 - 10.3 mg/dL   Total Protein 6.0 (L) 6.5 - 8.1 g/dL   Albumin 3.4 (L) 3.5 - 5.0 g/dL   AST 33 15 - 41 U/L   ALT 54 (H) 0 - 44 U/L   Alkaline Phosphatase 77 38 - 126 U/L   Total Bilirubin 1.1 0.0 - 1.2 mg/dL   GFR, Estimated >81 >19 mL/min    Comment: (NOTE) Calculated using the CKD-EPI Creatinine Equation (2021)    Anion gap 8 5 - 15    Comment: Performed at [facility], [address], [place], [name] [address]  Brain natriuretic peptide     Status: Abnormal   Collection Time: 12/20/23  4:36 AM  Result Value Ref Range   B Natriuretic Peptide 1,785.7 (H) 0.0 - 100.0 pg/mL    Comment: Performed at [facility], [address], [place], [name] [address]  CBC with Differential     Status: Abnormal   Collection Time: 12/20/23  4:36 AM  Result Value Ref Range   WBC 4.8 4.0 - 10.5 K/uL   RBC 3.91 (L) 4.22 - 5.81 MIL/uL   Hemoglobin 12.5 (L) 13.0 - 17.0 g/dL   HCT 30.8 (L) 65.7 - 84.6 %   MCV 98.7 80.0 - 100.0 fL   MCH 32.0 26.0 - 34.0 pg   MCHC 32.4 30.0 - 36.0 g/dL   RDW 96.2 (H) 95.2 - 84.1 %   Platelets 213 150 - 400 K/uL   nRBC 0.0 0.0 - 0.2 %   Neutrophils Relative % 36 %   Neutro Abs 1.7 1.7 - 7.7 K/uL   Lymphocytes Relative 52 %   Lymphs Abs 2.5 0.7 - 4.0 K/uL   Monocytes Relative 10 %   Monocytes Absolute  0.5 0.1 - 1.0 K/uL   Eosinophils Relative 1 %   Eosinophils Absolute 0.1 0.0 - 0.5 K/uL   Basophils Relative 1 %   Basophils Absolute 0.0 0.0 - 0.1 K/uL   Immature Granulocytes 0 %   Abs Immature Granulocytes 0.01 0.00 - 0.07 K/uL    Comment: Performed at [facility], [address] [place], [name] [address]  Troponin I (High Sensitivity)     Status: Abnormal   Collection Time: 12/20/23  4:36 AM  Result Value Ref Range   Troponin I (High Sensitivity) 110 (HH) <18 ng/L    Comment: CRITICAL RESULT CALLED TO, READ BACK BY AND VERIFIED WITH ESTRADA,G AT 1027 ON 12/19/23 BY LUZOLOP (NOTE) Elevated high sensitivity troponin I (hsTnI) values and significant  changes across serial measurements may suggest ACS but many other  chronic and acute conditions are known to elevate hsTnI results.  Refer to the "Links" section for chest pain algorithms and additional  guidance. Performed at [facility], [address], [place], [name] [address]   Troponin I (High Sensitivity)     Status: Abnormal   Collection Time: 12/20/23  6:47 AM  Result Value Ref Range   Troponin I (High Sensitivity) 99 (H) <18 ng/L    Comment: (NOTE) Elevated high sensitivity troponin I (hsTnI) values and significant  changes across serial measurements may suggest ACS but many other  chronic and acute conditions are known to elevate hsTnI results.  Refer to the "Links" section  for chest pain algorithms and additional  guidance. Performed at [facility], [address] [place], [name] [address]    VAS [place] LOWER EXTREMITY VENOUS (DVT) Result Date: 12/20/2023  Lower Venous DVT Study Patient Name:  QUASEAN PINHO  Date of Exam:   12/20/2023 Medical Rec #: [id number]       Accession #:    [id number] Date of Birth: 04-25-1966       Patient Gender: M Patient Age:   24 years Exam Location:  [facility] Procedure:      VAS [place] LOWER EXTREMITY VENOUS (DVT) Referring  Phys: TIMOTHY OPYD --------------------------------------------------------------------------------  Indications: Pain.  Risk Factors: PE (07/16/2022) RUE Partial DVT (03/30/22). 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 Comparison Study: Previous exams on 12/07/22, 08/18/22, 03/30/22, 11/07/21, and                   03/02/21 were ALL negative for DVT Performing Technologist: [name] RVT, RDMS  Examination Guidelines: A complete evaluation includes B-mode imaging, spectral Doppler, color Doppler, and power Doppler as needed of all accessible portions of each vessel. Bilateral testing is considered an integral part of a complete examination. Limited examinations for reoccurring indications may be performed as noted. The reflux portion of the exam is performed with the patient in reverse Trendelenburg.  +-----+---------------+---------+-----------+----------+--------------+ RIGHTCompressibilityPhasicitySpontaneityPropertiesThrombus Aging +-----+---------------+---------+-----------+----------+--------------+ CFV  Full           Yes      Yes                                 +-----+---------------+---------+-----------+----------+--------------+ pulsatile doppler waveform in CFV  +---------+---------------+---------+-----------+----------+---------------+ LEFT     CompressibilityPhasicitySpontaneityPropertiesThrombus Aging  +---------+---------------+---------+-----------+----------+---------------+ CFV                     No       Yes                                  +---------+---------------+---------+-----------+----------+---------------+ SFJ                                                   patent by color +---------+---------------+---------+-----------+----------+---------------+ FV Prox                 No       Yes                                  +---------+---------------+---------+-----------+----------+---------------+ FV Mid                  No        Yes                                  +---------+---------------+---------+-----------+----------+---------------+ FV Distal               No       Yes                                  +---------+---------------+---------+-----------+----------+---------------+  PFV                     No       Yes                                  +---------+---------------+---------+-----------+----------+---------------+ POP      Full           No       Yes                                  +---------+---------------+---------+-----------+----------+---------------+ PTV                                                   BKA             +---------+---------------+---------+-----------+----------+---------------+ PERO                                                  BKA             +---------+---------------+---------+-----------+----------+---------------+ Patient lying on right side making exam very difficult. CFV, SFJ, FV were all patent by color and doppler only. Pulsatile and dimished doppler waveforms throughout.    Summary: RIGHT: - No evidence of common femoral vein obstruction.  LEFT: - There is no evidence of deep vein thrombosis in the lower extremity. However, portions of this examination were limited- see technologist comments above.  - No cystic structure found in the popliteal fossa.  *See table(s) above for measurements and observations. Electronically signed by Sherald Hess MD on 12/20/2023 at 1:47:24 PM.    Final    DG Chest Portable 1 View Result Date: 12/20/2023 CLINICAL DATA:  Shortness of breath EXAM: PORTABLE CHEST 1 VIEW COMPARISON:  02/13/2023 FINDINGS: Cardiopericardial enlargement. Aortic and hilar contours are unremarkable. Interstitial coarsening which is baseline, there is emphysema by recent chest CT. Mild hazy density in the right mid lung is stable from CT. There is no edema, consolidation, effusion, or pneumothorax. IMPRESSION: 1. Stable from recent chest  CT. 2. Chronic lung disease and cardiomegaly. Electronically Signed   By: Tiburcio Pea M.D.   On: 12/20/2023 04:28    Pending Labs Unresulted Labs (From admission, onward)     Start     Ordered   12/27/23 0500  Creatinine, serum  (enoxaparin (LOVENOX)    CrCl >/= 30 ml/min)  Weekly,   R     Comments: while on enoxaparin therapy    12/20/23 0601   12/21/23 0500  HIV Antibody (routine testing w rflx)  (HIV Antibody (Routine testing w reflex) panel)  Tomorrow morning,   R        12/20/23 0601   12/21/23 0500  Basic metabolic panel  Daily,   R      12/20/23 0601   12/21/23 0500  Magnesium  Tomorrow morning,   R        12/20/23 0601   12/21/23 0500  CBC  Daily,   R      12/20/23 0601  Vitals/Pain Today's Vitals   12/20/23 0900 12/20/23 1153 12/20/23 1354 12/20/23 1416  BP: (!) 137/113  (!) 140/121   Pulse: 72  85   Resp: 14  18   Temp:  97.8 F (36.6 C)    TempSrc:  Oral    SpO2: 99%  100%   PainSc:    9     Isolation Precautions No active isolations  Medications Medications  enoxaparin (LOVENOX) injection 40 mg (40 mg Subcutaneous Given 12/20/23 1049)  sodium chloride flush (NS) 0.9 % injection 3 mL (3 mLs Intravenous Patient Refused/Not Given 12/20/23 1049)  acetaminophen (TYLENOL) tablet 650 mg (has no administration in time range)    Or  acetaminophen (TYLENOL) suppository 650 mg (has no administration in time range)  oxyCODONE (Oxy IR/ROXICODONE) immediate release tablet 5-10 mg (10 mg Oral Given 12/20/23 1421)  senna-docusate (Senokot-S) tablet 1 tablet (has no administration in time range)  furosemide (LASIX) injection 40 mg (has no administration in time range)  albuterol (PROVENTIL) (2.5 MG/3ML) 0.083% nebulizer solution 2.5 mg (has no administration in time range)  insulin aspart (novoLOG) injection 0-6 Units ( Subcutaneous Patient Refused/Not Given 12/20/23 1606)  insulin aspart (novoLOG) injection 0-5 Units (has no administration in time range)   baclofen (LIORESAL) tablet 5 mg (has no administration in time range)  ipratropium-albuterol (DUONEB) 0.5-2.5 (3) MG/3ML nebulizer solution 3 mL (3 mLs Nebulization Given 12/20/23 0424)  oxyCODONE (Oxy IR/ROXICODONE) immediate release tablet 5 mg (5 mg Oral Given 12/20/23 0502)  furosemide (LASIX) injection 60 mg (60 mg Intravenous Given 12/20/23 0544)  morphine (PF) 4 MG/ML injection 4 mg (4 mg Intravenous Given 12/20/23 0645)    Mobility non-ambulatory

## 2023-12-20 NOTE — Plan of Care (Signed)
  Problem: Education: Goal: Ability to describe self-care measures that may prevent or decrease complications (Diabetes Survival Skills Education) will improve Outcome: Progressing Goal: Individualized Educational Video(s) Outcome: Progressing   Problem: Coping: Goal: Ability to adjust to condition or change in health will improve Outcome: Progressing   Problem: Fluid Volume: Goal: Ability to maintain a balanced intake and output will improve Outcome: Progressing   Problem: Health Behavior/Discharge Planning: Goal: Ability to identify and utilize available resources and services will improve Outcome: Progressing Goal: Ability to manage health-related needs will improve Outcome: Progressing   Problem: Metabolic: Goal: Ability to maintain appropriate glucose levels will improve Outcome: Progressing   Problem: Nutritional: Goal: Maintenance of adequate nutrition will improve Outcome: Progressing Goal: Progress toward achieving an optimal weight will improve Outcome: Progressing   Problem: Skin Integrity: Goal: Risk for impaired skin integrity will decrease Outcome: Progressing   Problem: Tissue Perfusion: Goal: Adequacy of tissue perfusion will improve Outcome: Progressing   Problem: Education: Goal: Knowledge of General Education information will improve Description: Including pain rating scale, medication(s)/side effects and non-pharmacologic comfort measures Outcome: Progressing   Problem: Health Behavior/Discharge Planning: Goal: Ability to manage health-related needs will improve Outcome: Progressing   Problem: Clinical Measurements: Goal: Ability to maintain clinical measurements within normal limits will improve Outcome: Progressing Goal: Will remain free from infection Outcome: Progressing Goal: Diagnostic test results will improve Outcome: Progressing Goal: Respiratory complications will improve Outcome: Progressing Goal: Cardiovascular complication will  be avoided Outcome: Progressing   Problem: Activity: Goal: Risk for activity intolerance will decrease Outcome: Progressing   Problem: Nutrition: Goal: Adequate nutrition will be maintained Outcome: Progressing   Problem: Coping: Goal: Level of anxiety will decrease Outcome: Progressing   Problem: Elimination: Goal: Will not experience complications related to bowel motility Outcome: Progressing Goal: Will not experience complications related to urinary retention Outcome: Progressing   Problem: Pain Managment: Goal: General experience of comfort will improve and/or be controlled Outcome: Progressing   Problem: Safety: Goal: Ability to remain free from injury will improve Outcome: Progressing   Problem: Skin Integrity: Goal: Risk for impaired skin integrity will decrease Outcome: Progressing   Problem: Education: Goal: Ability to demonstrate management of disease process will improve Outcome: Progressing Goal: Ability to verbalize understanding of medication therapies will improve Outcome: Progressing Goal: Individualized Educational Video(s) Outcome: Progressing   Problem: Activity: Goal: Capacity to carry out activities will improve Outcome: Progressing   Problem: Cardiac: Goal: Ability to achieve and maintain adequate cardiopulmonary perfusion will improve Outcome: Progressing

## 2023-12-20 NOTE — ED Notes (Signed)
Writer attempted to collect updated vital signs at bedside. Pt declined stating, "I am tired of yall coming here talking my blood pressure. That's all yall wanna do. No more, you're not taking it anymore, I'm fine." Pt provided education on the importance of frequent monitoring. Pt insisted against it. MD notified.

## 2023-12-21 DIAGNOSIS — I5023 Acute on chronic systolic (congestive) heart failure: Secondary | ICD-10-CM | POA: Diagnosis not present

## 2023-12-21 LAB — GLUCOSE, CAPILLARY: Glucose-Capillary: 84 mg/dL (ref 70–99)

## 2023-12-21 MED ORDER — ALBUTEROL SULFATE (2.5 MG/3ML) 0.083% IN NEBU
2.5000 mg | INHALATION_SOLUTION | RESPIRATORY_TRACT | Status: DC | PRN
  Administered 2023-12-21 – 2023-12-22 (×4): 2.5 mg via RESPIRATORY_TRACT
  Filled 2023-12-21 (×4): qty 3

## 2023-12-21 MED ORDER — HYDROXYZINE HCL 10 MG PO TABS
10.0000 mg | ORAL_TABLET | Freq: Three times a day (TID) | ORAL | Status: DC | PRN
  Administered 2023-12-21 – 2023-12-22 (×2): 10 mg via ORAL
  Filled 2023-12-21 (×3): qty 1

## 2023-12-21 MED ORDER — CARVEDILOL 12.5 MG PO TABS
12.5000 mg | ORAL_TABLET | Freq: Two times a day (BID) | ORAL | Status: DC
  Administered 2023-12-21 – 2023-12-22 (×2): 12.5 mg via ORAL
  Filled 2023-12-21 (×2): qty 1

## 2023-12-21 MED ORDER — ARIPIPRAZOLE 5 MG PO TABS
5.0000 mg | ORAL_TABLET | Freq: Every day | ORAL | Status: DC
  Administered 2023-12-21: 5 mg via ORAL
  Filled 2023-12-21 (×2): qty 1

## 2023-12-21 MED ORDER — IPRATROPIUM-ALBUTEROL 0.5-2.5 (3) MG/3ML IN SOLN
3.0000 mL | Freq: Two times a day (BID) | RESPIRATORY_TRACT | Status: DC
  Administered 2023-12-21 – 2023-12-22 (×3): 3 mL via RESPIRATORY_TRACT
  Filled 2023-12-21 (×3): qty 3

## 2023-12-21 NOTE — Plan of Care (Signed)
  Problem: Education: Goal: Ability to describe self-care measures that may prevent or decrease complications (Diabetes Survival Skills Education) will improve Outcome: Progressing   Problem: Coping: Goal: Ability to adjust to condition or change in health will improve Outcome: Progressing   Problem: Fluid Volume: Goal: Ability to maintain a balanced intake and output will improve Outcome: Progressing   

## 2023-12-21 NOTE — Progress Notes (Addendum)
Patient refused morning shift assessment at this time. RN expressed desire to listen to lung sounds at least - as he is audibly moaning with breathing. Patient adamantly refused. Will re attempt at a later time   Patient also removed telemetry monitor - refused to put on. Will re attempt at a later time   Patient also refused morning medications. But he did take respiratory inhalations. Notified Dr. Mahala Menghini

## 2023-12-21 NOTE — TOC Initial Note (Signed)
Transition of Care [facility]) - Initial/Assessment Note   Patient Details  Name: [name] MRN: [id number] Date of Birth: [birth year]  Transition of Care [facility]) CM/SW Contact:    Ewing Schlein, [name] Phone Number: 12/21/2023, 9:33 AM  Clinical Narrative: [facility] consulted for heart failure screening. Per chart review, patient has already been referred to Heart Failure Navigation Team. CSW attempted to call Kelli Hope, but was unable to leave a voicemail. TOC to follow for discharge needs.  Expected Discharge Plan: Home/Self Care Barriers to Discharge: Continued Medical Work up  Patient Goals and CMS Choice Choice offered to / list presented to : NA  Expected Discharge Plan and Services Post Acute Care Choice: NA Living arrangements for the past 2 months: Apartment          DME Arranged: N/A DME Agency: NA  Prior Living Arrangements/Services Living arrangements for the past 2 months: Apartment Lives with:: Self Patient language and need for interpreter reviewed:: Yes Need for Family Participation in Patient Care: Yes (Comment) Care giver support system in place?: Yes (comment) 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) Criminal Activity/Legal Involvement Pertinent to Current Situation/Hospitalization: No - Comment as needed  Activities of Daily Living ADL Screening (condition at time of admission) Independently performs ADLs?: No Does the patient have a NEW difficulty with bathing/dressing/toileting/self-feeding that is expected to last >3 days?: No Does the patient have a NEW difficulty with getting in/out of bed, walking, or climbing stairs that is expected to last >3 days?: No Does the patient have a NEW difficulty with communication that is expected to last >3 days?: No Is the patient deaf or have difficulty hearing?: No Does the patient have difficulty seeing, even when wearing glasses/contacts?: No Does the patient have difficulty concentrating, remembering, or making  decisions?: No  Emotional Assessment Orientation: : Oriented to Self, Oriented to Place, Oriented to  Time, Oriented to Situation Alcohol / Substance Use: Not Applicable Psych Involvement: No (comment)  Admission diagnosis:  SOB (shortness of breath) [R06.02] Acute on chronic HFrEF (heart failure with reduced ejection fraction) (HCC) [I50.23] Patient Active Problem List   Diagnosis Date Noted   Acute on chronic HFrEF (heart failure with reduced ejection fraction) (HCC) 12/20/2023   Cocaine use 10/23/2023   COPD (chronic obstructive pulmonary disease) (HCC) 11/25/2022   Pulmonary emboli (HCC) 08/17/2022   Venous ulcer (HCC) 07/23/2022   Sinus bradycardia 07/23/2022   Anemia of chronic disease 07/23/2022   S/P AKA (above knee amputation), right (HCC) 07/23/2022   Acute on chronic systolic congestive heart failure (HCC) 07/12/2022   History of DVT (deep vein thrombosis) 07/12/2022   CAP (community acquired pneumonia) 07/12/2022   Chronic venous hypertension (idiopathic) with ulcer and inflammation of bilateral lower extremity (HCC)    Normocytic anemia 04/04/2022   Acute deep vein thrombosis (DVT) of brachial vein (HCC) 03/31/2022   Swelling of lower extremity 03/23/2022   SIRS (systemic inflammatory response syndrome) (HCC) 03/23/2022   Leg swelling 03/23/2022   Acute on chronic systolic heart failure (HCC) 02/26/2022   Seizure (HCC) 02/26/2022   Venous stasis ulcer of right ankle limited to breakdown of skin with varicose veins (HCC)    Hypertensive urgency 01/29/2022   Prolonged QT interval 01/29/2022   Alcohol abuse 01/29/2022   chronic ulcers 01/29/2022   Wound infection 12/04/2021   Mixed diabetic hyperlipidemia associated with type 2 diabetes mellitus (HCC) 12/03/2021   Cellulitis of right lower extremity 11/13/2021   Type 2 diabetes mellitus (HCC) 11/13/2021   Antisocial personality disorder (  HCC) 11/04/2021   Malingering 11/04/2021   Foot pain 04/23/2021   Chronic  combined systolic and diastolic congestive heart failure (HCC) 03/02/2021   History of stroke 03/02/2021   Polysubstance abuse (HCC) 03/02/2021   Skin ulcer of right great toe with necrosis of bone (HCC)    Encounter for assessment of healthcare decision-making capacity    Pain, dental    Dystonia    Dysarthria    Shock liver 01/04/2021   Acute metabolic encephalopathy 01/04/2021   Coronary artery disease involving native coronary artery of native heart without angina pectoris 01/04/2021   Essential hypertension 01/04/2021   Bipolar I disorder (HCC) 01/04/2021   Cocaine abuse (HCC) 01/04/2021   Homelessness 01/04/2021   HLD (hyperlipidemia) 01/04/2021   Osteomyelitis of great toe (HCC)    Ulcer of great toe, left, with necrosis of bone (HCC)    Ulcer of great toe, right, with necrosis of muscle (HCC)    Acute osteomyelitis of toe, left (HCC)    Acute on chronic combined systolic and diastolic CHF (congestive heart failure) (HCC) 12/20/2020   Respiratory failure (HCC)    Ischemic stroke (HCC) 12/17/2020   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   Diabetic foot ulcer (HCC) 12/08/2020   CHF (congestive heart failure) (HCC) 11/14/2020   CHF exacerbation (HCC) 11/13/2020   Hypokalemia    Hyperlipidemia    Acute hypoxemic respiratory failure (HCC) 10/26/2020   COPD with acute exacerbation (HCC) 10/14/2020   Acute on chronic combined systolic and diastolic CHF (congestive heart failure) (HCC) 10/02/2020   Heart failure with reduced ejection fraction and diastolic dysfunction (HCC) 09/07/2020   Near syncope 07/21/2020   Acid reflux 06/03/2020   Cannabis abuse 03/19/2020   Acute bronchitis due to Rhinovirus 09/08/2019   Schizoaffective disorder (HCC) 06/24/2019   SOB (shortness of breath)    Cocaine abuse (HCC)    Elevated troponin    Homeless    Suicidal ideation    Bilateral lower extremity pain 04/05/2019   Housing situation unstable 03/28/2019   Bilateral lower extremity edema     Acute exacerbation of CHF (congestive heart failure) (HCC) 03/04/2019   Alcohol withdrawal syndrome, with delirium (HCC) 05/03/2018   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   Mild intermittent asthma without complication 08/28/2017   Asthma 12/26/2016   Tobacco use disorder 12/26/2016   Schizophrenia (HCC) 12/26/2016   Severe episode of recurrent major depressive disorder, with psychotic features (HCC) 10/31/2016   Cocaine abuse with cocaine-induced mood disorder (HCC) 09/06/2016   Alcohol use disorder, mild, abuse 07/31/2016   Substance induced mood disorder (HCC) 07/30/2016   Schizoaffective disorder, bipolar type (HCC) 05/31/2016   Cocaine use disorder, severe, dependence (HCC) 05/31/2016   Major depressive disorder, recurrent episode (HCC) 05/30/2016   Suicidal behavior with attempted self-injury (HCC) 05/08/2016   Acetaminophen overdose 05/06/2016   PCP:  Georganna Skeans, MD Pharmacy:   [facility] [place], [name] - [address] [facility] [address] [name] [address] Phone: [phone] Fax: [phone]  Social Drivers of Health (SDOH) Social History: SDOH Screenings   Food Insecurity: Patient Declined (12/20/2023)  Housing: Patient Declined (12/20/2023)  Transportation Needs: Patient Declined (12/20/2023)  Utilities: Patient Declined (12/20/2023)  Alcohol Screen: Medium Risk (06/24/2019)  Depression (PHQ2-9): Medium Risk (07/28/2023)  Financial Resource Strain: High Risk (07/24/2021)   Received from [facility]  Social Connections: Unknown (04/05/2022)   Received from [facility], [facility]  Stress: Stress Concern Present (07/24/2021)  Received from [facility], [facility]  Tobacco Use: High Risk (12/20/2023)   SDOH Interventions:    Readmission Risk Interventions    12/21/2023    9:25 AM 12/09/2022    9:02 AM 12/01/2022    3:56 PM  Readmission Risk Prevention Plan  Transportation  Screening Complete Complete Complete  HRI or Home Care Consult Complete    Social Work Consult for Recovery Care Planning/Counseling Complete    Palliative Care Screening Not Applicable    Medication Review Oceanographer) Complete Complete Complete  PCP or Specialist appointment within 3-5 days of discharge  Complete Complete  HRI or Home Care Consult  Complete Complete  SW Recovery Care/Counseling Consult  Complete Complete  Palliative Care Screening  Not Applicable Not Applicable  Skilled Nursing Facility  Not Applicable Not Applicable  SNF Comments   n/a

## 2023-12-21 NOTE — Progress Notes (Signed)
TRH ROUNDING   NOTE [name] ZOX:[id number]  DOB: [birth year]  DOA: 12/20/2023  PCP: Georganna Skeans, MD  12/21/2023,9:45 AM   LOS: 1 day      Code Status: Full code From: Homeless current Dispo: Unclear   58 year old black male with known schizoaffective disorder polysubstance abuse HTN HLD DM TY 2 COPD  combined diastolic systolic heart failure based on echo 07/2022 Prior CVA with right hemiparesis Right AKA in the past left great toe amputation Prior thromboembolism noncompliant on anticoagulation previously Polysubstance abuse cocaine Previous extended hospitalization 12/20/2022 through 06/20/2023 where he had multiple teeth abscesses removed and gangrene of the left foot he also had left BKA at that time 12/10/2022 and he was taken off of his anticoagulation at that time Previously seen 10/23/2023 in the ED with SI ideation spent several days there-and was cleared by psychiatry and sent home with Vraylar  Events 12/19/2022 came to emergency room with shortness of breath 2 days of symptoms, left leg swelling pain not necessarily compliant with his Lasix?  Sodium 139 potassium 3.5 BUN/creatinine 15/0.9 BNP 1785-troponin 110 Cycrin to 99 WBC 4.8, hemoglobin 12.5, platelet 213 CXR stable chest x-ray recent cardiomegaly    procedures     Plan  Acute exacerbation HFrEF/HFpEF-last echo EF less than 20% NSTEMI demand ischemia Slightly volume overloaded on exam but seems to be on room air currently-given Lasix 40 IV twice daily-as he looks relatively comfortable we will transition to p.o. 40 twice daily---Resume coreg 12.5 twice daily  EKG shows T wave inversions in V4 through V6 but these have been chronic and he has no chest pain so I think this is type II ischemia Bilateral amputations Resume baclofen 5 3 times daily as needed, gabapentin 900 3 times daily,--long-term opiates discontinued in the outpatient setting Apparently now does have housing etc. and should be followed in the  outpatient setting Prior DVT/thromboembolism now not on anticoagulation At risk for further clot--refusing AC---risks of bleed etc outweigh benefits per prior MD so will hold--he is refusing lovenox also COPD Despite moaning and saying that he is SOB, he is @ 100% O2 sat on RA would hold inhalers at this time HTN HLD See above discussion Uncontrolled DM TY 2 refuses management Patient refused blood sugar checks and said that he did not have diabetes blood sugars are ranging from 130s to 180s so may be able to hold insulin/coberage Prior polysubstance abuse with cocaine Schizoaffective SI Previously was on Vraylar-discharged on lexapro and abilify---will resume Abilify 5 daily Does not require sitter  DVT prophylaxis: Lovenox  Status is: Inpatient Remains inpatient appropriate because:   Requires some diuresis may be able to go home in 1 to 2 days  Subjective:  Seems quite animated and refusing meds/o2 sat checks and insulin cheks and took off monitors C/o sob and CP but 02 sat is 100% Intermittently falls back to sleep  Objective + exam Vitals:   12/21/23 0144 12/21/23 0232 12/21/23 0659 12/21/23 0751  BP:  (!) 143/115    Pulse:  77    Resp:  20    Temp:  97.9 F (36.6 C)    TempSrc:  Oral    SpO2: 100% 100%  98%  Weight:   80.4 kg    Filed Weights   12/21/23 0659  Weight: 80.4 kg    Examination:  Awake distractible black male no distress S1-S2 no murmur no rub no gallop Chest clear no wheeze rales rhonchi--subjective presentation oop to objective findings Abdomen soft Left  BKA looks relatively clean seems a little bit agitated  Data Reviewed: reviewed   CBC    Component Value Date/Time   WBC 4.8 12/20/2023 0436   RBC 3.91 (L) 12/20/2023 0436   HGB 12.5 (L) 12/20/2023 0436   HCT 38.6 (L) 12/20/2023 0436   PLT 213 12/20/2023 0436   MCV 98.7 12/20/2023 0436   MCH 32.0 12/20/2023 0436   MCHC 32.4 12/20/2023 0436   RDW 16.2 (H) 12/20/2023 0436    LYMPHSABS 2.5 12/20/2023 0436   MONOABS 0.5 12/20/2023 0436   EOSABS 0.1 12/20/2023 0436   BASOSABS 0.0 12/20/2023 0436      Latest Ref Rng & Units 12/20/2023    4:36 AM 10/22/2023    4:43 PM 06/13/2023    2:43 PM  CMP  Glucose 70 - 99 mg/dL 84  161  096   BUN 6 - 20 mg/dL 15  24  16    Creatinine 0.61 - 1.24 mg/dL 0.45  4.09  8.11   Sodium 135 - 145 mmol/L 139  136  139   Potassium 3.5 - 5.1 mmol/L 3.5  3.6  3.7   Chloride 98 - 111 mmol/L 110  103  104   CO2 22 - 32 mmol/L 21  22  22    Calcium 8.9 - 10.3 mg/dL 8.3  9.0  9.0   Total Protein 6.5 - 8.1 g/dL 6.0  7.1  7.4   Total Bilirubin 0.0 - 1.2 mg/dL 1.1  1.5  0.3   Alkaline Phos 38 - 126 U/L 77  68  72   AST 15 - 41 U/L 33  27  19   ALT 0 - 44 U/L 54  26  19     Scheduled Meds:  enoxaparin (LOVENOX) injection  40 mg Subcutaneous Daily   furosemide  40 mg Intravenous Q12H   insulin aspart  0-5 Units Subcutaneous QHS   insulin aspart  0-6 Units Subcutaneous TID WC   ipratropium-albuterol  3 mL Nebulization BID   sodium chloride flush  3 mL Intravenous Q12H   Continuous Infusions:  Time 24  Rhetta Mura, MD  [facility]

## 2023-12-22 ENCOUNTER — Other Ambulatory Visit (HOSPITAL_COMMUNITY): Payer: Self-pay

## 2023-12-22 DIAGNOSIS — I5023 Acute on chronic systolic (congestive) heart failure: Secondary | ICD-10-CM | POA: Diagnosis not present

## 2023-12-22 LAB — BASIC METABOLIC PANEL
Anion gap: 9 (ref 5–15)
BUN: 18 mg/dL (ref 6–20)
CO2: 23 mmol/L (ref 22–32)
Calcium: 8.7 mg/dL — ABNORMAL LOW (ref 8.9–10.3)
Chloride: 107 mmol/L (ref 98–111)
Creatinine, Ser: 0.75 mg/dL (ref 0.61–1.24)
GFR, Estimated: >60 mL/min (ref >60)
Glucose, Bld: 99 mg/dL (ref 70–99)
Potassium: 4.3 mmol/L (ref 3.5–5.1)
Sodium: 139 mmol/L (ref 135–145)

## 2023-12-22 LAB — CBC
HCT: 39 % (ref 39.0–52.0)
Hemoglobin: 13.3 g/dL (ref 13.0–17.0)
MCH: 32.9 pg (ref 26.0–34.0)
MCHC: 34.1 g/dL (ref 30.0–36.0)
MCV: 96.5 fL (ref 80.0–100.0)
Platelets: 212 10*3/uL (ref 150–400)
RBC: 4.04 MIL/uL — ABNORMAL LOW (ref 4.22–5.81)
RDW: 15.9 % — ABNORMAL HIGH (ref 11.5–15.5)
WBC: 6.2 10*3/uL (ref 4.0–10.5)
nRBC: 0 % (ref 0.0–0.2)

## 2023-12-22 LAB — GLUCOSE, CAPILLARY: Glucose-Capillary: 77 mg/dL (ref 70–99)

## 2023-12-22 MED ORDER — OXYCODONE HCL ER 10 MG PO T12A
20.0000 mg | EXTENDED_RELEASE_TABLET | Freq: Two times a day (BID) | ORAL | 0 refills | Status: DC
  Filled 2023-12-22: qty 20, 5d supply, fill #0

## 2023-12-22 MED ORDER — OXYCODONE HCL ER 10 MG PO T12A: 20.0000 mg | EXTENDED_RELEASE_TABLET | Freq: Two times a day (BID) | ORAL | 0 refills | Status: DC

## 2023-12-22 MED ORDER — FUROSEMIDE 20 MG PO TABS: 40.0000 mg | ORAL_TABLET | Freq: Two times a day (BID) | ORAL | 0 refills | Status: DC

## 2023-12-22 MED ORDER — ARIPIPRAZOLE 5 MG PO TABS: 5.0000 mg | ORAL_TABLET | Freq: Every day | ORAL | 3 refills | Status: DC

## 2023-12-22 MED ORDER — HYDROXYZINE HCL 10 MG PO TABS: 10.0000 mg | ORAL_TABLET | Freq: Three times a day (TID) | ORAL | 3 refills | Status: DC | PRN

## 2023-12-22 MED ORDER — CARVEDILOL 12.5 MG PO TABS: 12.5000 mg | ORAL_TABLET | Freq: Two times a day (BID) | ORAL | 3 refills | Status: DC

## 2023-12-22 NOTE — Progress Notes (Signed)
Pt. Refused to allow this nurse to conduct full assessment.

## 2023-12-22 NOTE — Plan of Care (Signed)
  Problem: Coping: Goal: Ability to adjust to condition or change in health will improve Outcome: Progressing   Problem: Activity: Goal: Risk for activity intolerance will decrease Outcome: Progressing   Problem: Pain Managment: Goal: General experience of comfort will improve and/or be controlled Outcome: Progressing

## 2023-12-22 NOTE — TOC Transition Note (Addendum)
Transition of Care [facility]) - Discharge Note  Patient Details  Name: [name] MRN: [id number] Date of Birth: Dec 23, 1965  Transition of Care [facility]) CM/SW Contact:  Ewing Schlein, [name] Phone Number: 12/22/2023, 12:09 PM  Clinical Narrative: Patient will need transportation back to his apartment and his wheelchair is in his room. Patient signed rider waiver, which was placed on chart. CSW scheduled patient with wheelchair transportation with [employer].  Patient will need his medications picked up. CSW spoke with Genta with [name] [phone]), which is the patient's ACTT team, and confirmed patient's ACTT team will be able to pick up his medications. Discharge summary faxed to [name] [phone]). RN updated. TOC signing off.  Addendum: CSW received call from Jens Som [phone]). [name] aware patient's pain medication will need to be picked up from the [employer] due to it requiring a prior authorization.  Final next level of care: Home/Self Care Barriers to Discharge: Barriers Resolved  Patient Goals and CMS Choice Patient states their goals for this hospitalization and ongoing recovery are:: Discharge home Choice offered to / list presented to : NA  Discharge Placement Patient to be transferred to facility by: Safe Transport  Discharge Plan and Services Additional resources added to the After Visit Summary for   Post Acute Care Choice: NA          DME Arranged: N/A DME Agency: NA  Social Drivers of Health (SDOH) Interventions SDOH Screenings   Food Insecurity: Patient Declined (12/20/2023)  Housing: Patient Declined (12/20/2023)  Transportation Needs: Patient Declined (12/20/2023)  Utilities: Patient Declined (12/20/2023)  Alcohol Screen: Medium Risk (06/24/2019)  Depression (PHQ2-9): Medium Risk (07/28/2023)  Financial Resource Strain: High Risk (07/24/2021)   Received from [facility]  Social Connections: Unknown  (04/05/2022)   Received from [facility], [facility]  Stress: Stress Concern Present (07/24/2021)   Received from [facility], [facility]  Tobacco Use: High Risk (12/20/2023)   Readmission Risk Interventions    12/21/2023    9:25 AM 12/09/2022    9:02 AM 12/01/2022    3:56 PM  Readmission Risk Prevention Plan  Transportation Screening Complete Complete Complete  HRI or Home Care Consult Complete    Social Work Consult for Recovery Care Planning/Counseling Complete    Palliative Care Screening Not Applicable    Medication Review Oceanographer) Complete Complete Complete  PCP or Specialist appointment within 3-5 days of discharge  Complete Complete  HRI or Home Care Consult  Complete Complete  SW Recovery Care/Counseling Consult  Complete Complete  Palliative Care Screening  Not Applicable Not Applicable  Skilled Nursing Facility  Not Applicable Not Applicable  SNF Comments   n/a

## 2023-12-22 NOTE — Discharge Summary (Signed)
Physician Discharge Summary  [name] VFI:[id number] DOB: [birth year] DOA: 12/20/2023  PCP: Georganna Skeans, MD  Admit date: 12/20/2023 Discharge date: 12/22/2023  Time spent: 46 minutes  Recommendations for Outpatient Follow-up:  Recommend Chem-12 CBC in 1 to 2 weeks Needs refills on Abilify hydroxyzine previously has been on Vraylar and may need referral to psychiatry as an outpatient For sticking etc. 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 Prior thromboembolism noncompliant on anticoagulation previously Polysubstance abuse cocaine Previous extended hospitalization 12/20/2022 through 06/20/2023 where he had multiple teeth abscesses removed and gangrene of the left foot he also 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               Sodium 139 potassium 3.5 BUN/creatinine 15/0.9 BNP 1785-troponin 110 Cycrin to 99 WBC 4.8, hemoglobin 12.5, platelet 213 CXR stable chest x-ray recent cardiomegaly                   procedures         Plan   Acute exacerbation HFrEF/HFpEF-last echo EF less than 20% NSTEMI demand ischemia Slightly volume overloaded on exam but seems to be on room air currently-given Lasix 40 IV twice daily-transitioned ultimately to twice daily p.o. 40 Lasix--Resume coreg 12.5 twice daily  EKG shows T wave inversions in V4 through V6 but these have been chronic and he has no chest pain so I think this is type II ischemia He is at high risk for rehospitalization and decompensation secondary to noncompliance Bilateral amputations Resume baclofen 5 3 times daily as needed, gabapentin 900 3 times daily,--long-term opiates discontinued in the outpatient setting but as he requested 1 to 2 days pain meds given his bilateral BKA's and think that this is reasonable and gave him a small prescription and check PDMP Apparently now does have housing etc. and should be followed in the outpatient setting with his primary physician Prior DVT/thromboembolism now not on anticoagulation At risk for further clot--refusing AC---risks of bleed etc outweigh benefits although he did have a very large pulmonary embolism in the past I think this should be revisited in the outpatient setting COPD He experienced some subjective shortness of breath but his oxygen level stayed at 100% on room air He was not requiring oxygen at discharge would hold inhalers at this time HTN HLD See above discussion Uncontrolled DM TY 2 refuses management Patient refused blood sugar checks and said that he did not have diabetes blood sugars are ranging from 130s to 180s for now can hold coverage Prior polysubstance abuse with cocaine  Schizoaffective SI Previously was on Vraylar-discharged on lexapro and abilify---will resume Abilify 5 daily-given he was slightly anxious he was given hydroxyzine prescription additionally at discharge Does not require sitter  Discharge Exam: Vitals:   12/22/23 0431 12/22/23 0808  BP: 133/88    Pulse: 67   Resp: 18   Temp: 98.5 F (36.9 C)   SpO2: 99% 100%    Subj on day of d/c   Looks well feels fair no distress no chest pain no nausea no vomiting  General Exam on discharge  EOMI NCAT no focal deficit no icterus no pallor Chest clear no wheeze rales rhonchi No JVD S1-S2 no murmur Abdomen soft no rebound no guarding Power 5/5  Discharge Instructions   Discharge Instructions     Diet - low sodium heart healthy   Complete by: As directed    Discharge instructions   Complete by: As directed    Please make sure that you check your blood sugars carefully and take your medications as you are supposed to I have refilled some of your medications for your schizophrenia and I have given you Abilify to take once daily Please take hydroxyzine 1 tablet 3 times a day if you get anxious I can only give you a limited amount of OxyContin you will have to follow-up with your regular physician for refills Please make sure you do not miss taking your Lasix as this will prevent you from being short of breath   Increase activity slowly   Complete by: As directed       Allergies as of 12/22/2023       Reactions   Acetaminophen Hives, Nausea And Vomiting, Rash, Anaphylaxis   PT states that if he takes this he is going to the hospital   Motrin [ibuprofen] Anaphylaxis   Nsaids Anaphylaxis   PT states that if he takes this he is going to the hospital   Lyrica Cr [pregabalin Er] Other (See Comments)   Dystonia Dysarthria    Peanut-containing Drug Products Other (See Comments)   Pt denies allergy to peanut butter 03/02/2021        Medication List     TAKE these medications    ARIPiprazole 5 MG tablet Commonly known as: ABILIFY Take 1 tablet (5 mg total) by mouth daily. Start taking on: December 23, 2023   Baclofen 5 MG Tabs Take 1 tablet (5 mg total) by mouth 3 (three) times daily as needed.   carvedilol 12.5 MG tablet Commonly known as: COREG Take 1 tablet (12.5 mg  total) by mouth 2 (two) times daily with a meal.   furosemide 20 MG tablet Commonly known as: LASIX Take 2 tablets (40 mg total) by mouth 2 (two) times daily. What changed:  how much to take when to take this   gabapentin 300 MG capsule Commonly known as: NEURONTIN Take 3 capsules (900 mg total) by mouth 3 (three) times daily.   hydrOXYzine 10 MG tablet Commonly known as: ATARAX Take 1 tablet (10 mg total) by mouth 3 (three) times daily as needed for anxiety.   oxyCODONE 10 mg 12 hr tablet Commonly known as: OXYCONTIN Take 2 tablets (20 mg total) by mouth every 12 (twelve) hours. What changed: Another medication with the same name was removed. Continue taking this medication, and follow the directions you see here.   Ventolin HFA 108 (90 Base) MCG/ACT inhaler Generic drug: albuterol INHALE TWO PUFFS INTO THE LUNGS EVERY 6 (SIX) HOURS AS NEEDED FOR WHEEZING OR SHORTNESS  OF BREATH       Allergies  Allergen Reactions   Acetaminophen Hives, Nausea And Vomiting, Rash and Anaphylaxis    PT states that if he takes this he is going to the hospital   Motrin [Ibuprofen] Anaphylaxis   Nsaids Anaphylaxis    PT states that if he takes this he is going to the hospital   Lyrica Cr [Pregabalin Er] Other (See Comments)    Dystonia Dysarthria    Peanut-Containing Drug Products Other (See Comments)    Pt denies allergy to peanut butter 03/02/2021      The results of significant diagnostics from this hospitalization (including imaging, microbiology, ancillary and laboratory) are listed below for reference.    Significant Diagnostic Studies: VAS [place] LOWER EXTREMITY VENOUS (DVT) Result Date: 12/20/2023  Lower Venous DVT Study Patient Name:  [name]  Date of Exam:   12/20/2023 Medical Rec #: [id number]       Accession #:    [id number] Date of Birth: [birth year]       Patient Gender: M Patient Age:   33 years Exam Location:  [facility] Procedure:      VAS [place] LOWER EXTREMITY VENOUS  (DVT) Referring Phys: TIMOTHY OPYD --------------------------------------------------------------------------------  Indications: Pain.  Risk Factors: PE (07/16/2022) RUE Partial DVT (03/30/22). 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 Comparison Study: Previous exams on 12/07/22, 08/18/22, 03/30/22, 11/07/21, and                   03/02/21 were ALL negative for DVT Performing Technologist: [name] RVT, RDMS  Examination Guidelines: A complete evaluation includes B-mode imaging, spectral Doppler, color Doppler, and power Doppler as needed of all accessible portions of each vessel. Bilateral testing is considered an integral part of a complete examination. Limited examinations for reoccurring indications may be performed as noted. The reflux portion of the exam is performed with the patient in reverse Trendelenburg.  +-----+---------------+---------+-----------+----------+--------------+ RIGHTCompressibilityPhasicitySpontaneityPropertiesThrombus Aging +-----+---------------+---------+-----------+----------+--------------+ CFV  Full           Yes      Yes                                 +-----+---------------+---------+-----------+----------+--------------+ pulsatile doppler waveform in CFV  +---------+---------------+---------+-----------+----------+---------------+ LEFT     CompressibilityPhasicitySpontaneityPropertiesThrombus Aging  +---------+---------------+---------+-----------+----------+---------------+ CFV                     No       Yes                                  +---------+---------------+---------+-----------+----------+---------------+ SFJ                                                   patent by color +---------+---------------+---------+-----------+----------+---------------+ FV Prox                 No       Yes                                  +---------+---------------+---------+-----------+----------+---------------+ FV Mid  No       Yes                                  +---------+---------------+---------+-----------+----------+---------------+ FV Distal               No       Yes                                  +---------+---------------+---------+-----------+----------+---------------+ PFV                     No       Yes                                  +---------+---------------+---------+-----------+----------+---------------+ POP      Full           No       Yes                                  +---------+---------------+---------+-----------+----------+---------------+ PTV                                                   BKA             +---------+---------------+---------+-----------+----------+---------------+ PERO                                                  BKA             +---------+---------------+---------+-----------+----------+---------------+ Patient lying on right side making exam very difficult. CFV, SFJ, FV were all patent by color and doppler only. Pulsatile and dimished doppler waveforms throughout.    Summary: RIGHT: - No evidence of common femoral vein obstruction.  LEFT: - There is no evidence of deep vein thrombosis in the lower extremity. However, portions of this examination were limited- see technologist comments above.  - No cystic structure found in the popliteal fossa.  *See table(s) above for measurements and observations. Electronically signed by Sherald Hess MD on 12/20/2023 at 1:47:24 PM.    Final    DG Chest Portable 1 View Result Date: 12/20/2023 CLINICAL DATA:  Shortness of breath EXAM: PORTABLE CHEST 1 VIEW COMPARISON:  02/13/2023 FINDINGS: Cardiopericardial enlargement. Aortic and hilar contours are unremarkable. Interstitial coarsening which is baseline, there is emphysema by recent chest CT. Mild hazy density in the right mid lung is stable from CT. There is no edema, consolidation, effusion, or pneumothorax. IMPRESSION: 1. Stable  from recent chest CT. 2. Chronic lung disease and cardiomegaly. Electronically Signed   By: Tiburcio Pea M.D.   On: 12/20/2023 04:28    Microbiology: Recent Results (from the past 240 hours)  Resp panel by RT-PCR (RSV, Flu A&B, Covid) Anterior Nasal Swab     Status: None   Collection Time: 12/20/23  3:55 AM   Specimen: Anterior Nasal Swab  Result Value Ref Range Status   SARS Coronavirus 2 by RT PCR  NEGATIVE NEGATIVE Final    Comment: (NOTE) SARS-CoV-2 target nucleic acids are NOT DETECTED.  The SARS-CoV-2 RNA is generally detectable in upper respiratory specimens during the acute phase of infection. The lowest concentration of SARS-CoV-2 viral copies this assay can detect is 138 copies/mL. A negative result does not preclude SARS-Cov-2 infection and should not be used as the sole basis for treatment or other patient management decisions. A negative result may occur with  improper specimen collection/handling, submission of specimen other than nasopharyngeal swab, presence of viral mutation(s) within the areas targeted by this assay, and inadequate number of viral copies(<138 copies/mL). A negative result must be combined with clinical observations, patient history, and epidemiological information. The expected result is Negative.  Fact Sheet for Patients:  BloggerCourse.com  Fact Sheet for Healthcare Providers:  SeriousBroker.it  This test is no t yet approved or cleared by the [place] FDA and  has been authorized for detection and/or diagnosis of SARS-CoV-2 by FDA under an Emergency Use Authorization (EUA). This EUA will remain  in effect (meaning this test can be used) for the duration of the COVID-19 declaration under Section 564(b)(1) of the Act, 21 U.S.C.section 360bbb-3(b)(1), unless the authorization is terminated  or revoked sooner.       Influenza A by PCR NEGATIVE NEGATIVE Final   Influenza B by PCR  NEGATIVE NEGATIVE Final    Comment: (NOTE) The Xpert Xpress SARS-CoV-2/FLU/RSV plus assay is intended as an aid in the diagnosis of influenza from Nasopharyngeal swab specimens and should not be used as a sole basis for treatment. Nasal washings and aspirates are unacceptable for Xpert Xpress SARS-CoV-2/FLU/RSV testing.  Fact Sheet for Patients: BloggerCourse.com  Fact Sheet for Healthcare Providers: SeriousBroker.it  This test is not yet approved or cleared by the [place] FDA and has been authorized for detection and/or diagnosis of SARS-CoV-2 by FDA under an Emergency Use Authorization (EUA). This EUA will remain in effect (meaning this test can be used) for the duration of the COVID-19 declaration under Section 564(b)(1) of the Act, 21 U.S.C. section 360bbb-3(b)(1), unless the authorization is terminated or revoked.     Resp Syncytial Virus by PCR NEGATIVE NEGATIVE Final    Comment: (NOTE) Fact Sheet for Patients: BloggerCourse.com  Fact Sheet for Healthcare Providers: SeriousBroker.it  This test is not yet approved or cleared by the [place] FDA and has been authorized for detection and/or diagnosis of SARS-CoV-2 by FDA under an Emergency Use Authorization (EUA). This EUA will remain in effect (meaning this test can be used) for the duration of the COVID-19 declaration under Section 564(b)(1) of the Act, 21 U.S.C. section 360bbb-3(b)(1), unless the authorization is terminated or revoked.  Performed at [facility], [address], [place], [name] [address]      Labs: Basic Metabolic Panel: Recent Labs  Lab 12/20/23 0436 12/22/23 0527  NA 139 139  K 3.5 4.3  CL 110 107  CO2 21* 23  GLUCOSE 84 99  BUN 15 18  CREATININE 0.98 0.75  CALCIUM 8.3* 8.7*   Liver Function Tests: Recent Labs  Lab 12/20/23 0436  AST 33  ALT 54*   ALKPHOS 77  BILITOT 1.1  PROT 6.0*  ALBUMIN 3.4*   No results for input(s): "LIPASE", "AMYLASE" in the last 168 hours. No results for input(s): "AMMONIA" in the last 168 hours. CBC: Recent Labs  Lab 12/20/23 0436 12/22/23 0527  WBC 4.8 6.2  NEUTROABS 1.7  --   HGB 12.5* 13.3  HCT  38.6* 39.0  MCV 98.7 96.5  PLT 213 212   Cardiac Enzymes: No results for input(s): "CKTOTAL", "CKMB", "CKMBINDEX", "TROPONINI" in the last 168 hours. BNP: BNP (last 3 results) Recent Labs    02/13/23 1201 12/20/23 0436  BNP 116.5* 1,785.7*    ProBNP (last 3 results) No results for input(s): "PROBNP" in the last 8760 hours.  CBG: Recent Labs  Lab 12/20/23 2043 12/21/23 1118 12/22/23 0753  GLUCAP 133* 84 77    Signed:  Rhetta Mura MD   [facility] 12/22/2023, 10:46 AM

## 2023-12-23 ENCOUNTER — Telehealth (HOSPITAL_COMMUNITY): Payer: Self-pay | Admitting: Pharmacy Technician

## 2023-12-23 ENCOUNTER — Telehealth: Payer: Self-pay

## 2023-12-23 ENCOUNTER — Ambulatory Visit: Payer: MEDICAID | Admitting: Family Medicine

## 2023-12-23 NOTE — Transitions of Care (Post Inpatient/ED Visit) (Signed)
   12/23/2023  Name: Corry Peoples MRN: [id number] DOB: 12-29-1965  Today's TOC FU Call Status: Today's TOC FU Call Status:: Unsuccessful Call (1st Attempt) Unsuccessful Call (1st Attempt) Date: 12/23/23  Attempted to reach the patient regarding the most recent Inpatient/ED visit.  Follow Up Plan: Additional outreach attempts will be made to reach the patient to complete the Transitions of Care (Post Inpatient/ED visit) call.   The patient has an appointment at [facility] with Dr Andrey Campanile today, 12/23/2023 @ 1520  Signature  Robyne Peers, RN

## 2023-12-23 NOTE — Transitions of Care (Post Inpatient/ED Visit) (Signed)
   12/23/2023  Name: [name] MRN: [id number] DOB: Oct 05, 1966  Today's TOC FU Call Status: Today's TOC FU Call Status:: Unsuccessful Call (1st Attempt) Unsuccessful Call (1st Attempt) Date: 12/23/23  Attempted to reach the patient regarding the most recent Inpatient/ED visit.  Follow Up Plan: Additional outreach attempts will be made to reach the patient to complete the Transitions of Care (Post Inpatient/ED visit) call.   Alyse Low, RN, BA, [facility], CRRN [facility] Coordinator, Transition of Care Ph # [phone]

## 2023-12-23 NOTE — Telephone Encounter (Signed)
Pharmacy Patient Advocate Encounter  Received notification from [facility] that Prior Authorization for OxyCONTIN 10MG  er tablets has been APPROVED from 12/22/2023 to 03/21/2024. Ran test claim, Copay is $4.00. This test claim was processed through [facility]- copay amounts may vary at other pharmacies due to pharmacy/plan contracts, or as the patient moves through the different stages of their insurance plan.   PA #/Case ID/Reference #: [id number] Key: Sherrye Payor

## 2023-12-24 ENCOUNTER — Telehealth: Payer: Self-pay

## 2023-12-24 NOTE — Transitions of Care (Post Inpatient/ED Visit) (Signed)
   12/24/2023  Name: [name] MRN: [id number] DOB: 1966-07-15  Today's TOC FU Call Status: Today's TOC FU Call Status:: Unsuccessful Call (2nd Attempt) Unsuccessful Call (2nd Attempt) Date: 12/24/23  Attempted to reach the patient regarding the most recent Inpatient/ED visit.  Follow Up Plan: Additional outreach attempts will be made to reach the patient to complete the Transitions of Care (Post Inpatient/ED visit) call.   Alyse Low, RN, BA, [facility], CRRN [facility] Coordinator, Transition of Care Ph # [phone]

## 2023-12-25 ENCOUNTER — Telehealth: Payer: Self-pay

## 2023-12-25 ENCOUNTER — Ambulatory Visit: Payer: MEDICAID | Admitting: Family

## 2023-12-25 NOTE — Transitions of Care (Post Inpatient/ED Visit) (Signed)
   Name: Narciso Stoutenburg MRN: [id number] DOB: February 27, 1966  Today's TOC FU Call Status: Today's TOC FU Call Status:: Unsuccessful Call (3rd Attempt) Unsuccessful Call (3rd Attempt) Date: 12/25/23  Attempted to reach the patient regarding the most recent Inpatient/ED visit.  Follow Up Plan: No further outreach attempts will be made at this time. We have been unable to contact the patient.  Alyse Low, RN, BA, [facility], CRRN [facility] Coordinator, Transition of Care Ph # [phone]

## 2023-12-29 ENCOUNTER — Other Ambulatory Visit: Payer: Self-pay | Admitting: Family Medicine

## 2023-12-29 NOTE — Telephone Encounter (Signed)
Copied from CRM [id number]. Topic: Clinical - Medication Refill >> Dec 29, 2023  1:29 PM Patsy Lager T wrote: Most Recent Primary Care Visit:  Provider: Georganna Skeans  Department: [facility]  Visit Type: HOSPITAL FU  Date: 07/28/2023  Medication: oxyCODONE (OXYCONTIN) 10 mg 12 hr tablet   Has the patient contacted their pharmacy? No  Is this the correct pharmacy for this prescription? Yes   This is the patient's preferred pharmacy:  [id number] - [place], [place] - [address]  Phone: [phone] Fax: [phone]  Has the prescription been filled recently? No  Is the patient out of the medication? No  Has the patient been seen for an appointment in the last year OR does the patient have an upcoming appointment? Yes  Can we respond through MyChart? Yes  Agent: Please be advised that Rx refills may take up to 3 business days. We ask that you follow-up with your pharmacy.  [name] Kathrynn Running patients aid request to have the letter of approval from [facility] sent along with the script

## 2023-12-31 ENCOUNTER — Other Ambulatory Visit: Payer: Self-pay | Admitting: Family Medicine

## 2023-12-31 MED ORDER — OXYCODONE HCL ER 10 MG PO T12A: 20.0000 mg | EXTENDED_RELEASE_TABLET | Freq: Two times a day (BID) | ORAL | 0 refills | Status: DC

## 2023-12-31 NOTE — Progress Notes (Unsigned)
{  Select_TRH_Note:[id number]}

## 2024-01-01 ENCOUNTER — Telehealth: Payer: Self-pay

## 2024-01-01 ENCOUNTER — Telehealth: Payer: Self-pay | Admitting: Family Medicine

## 2024-01-01 NOTE — Telephone Encounter (Signed)
 Open in error

## 2024-01-01 NOTE — Telephone Encounter (Signed)
Called patient to inform him that we have him scheduled for a hospital follow up on 01/07/2024 at 9:20 am and also put in the notes patient would like discuss referral for pain management and possible medication change for inhaler.

## 2024-01-02 ENCOUNTER — Encounter (HOSPITAL_COMMUNITY): Payer: Self-pay

## 2024-01-02 ENCOUNTER — Other Ambulatory Visit: Payer: Self-pay

## 2024-01-02 ENCOUNTER — Inpatient Hospital Stay (HOSPITAL_COMMUNITY): Payer: MEDICAID

## 2024-01-02 ENCOUNTER — Emergency Department (HOSPITAL_COMMUNITY): Payer: MEDICAID

## 2024-01-02 ENCOUNTER — Inpatient Hospital Stay (HOSPITAL_COMMUNITY)
Admission: EM | Admit: 2024-01-02 | Discharge: 2024-01-06 | DRG: 291 | Disposition: A | Discharge status: Home / Self Care | Payer: MEDICAID | Attending: Internal Medicine | Admitting: Internal Medicine

## 2024-01-02 DIAGNOSIS — Z1152 Encounter for screening for COVID-19: Secondary | ICD-10-CM | POA: Diagnosis not present

## 2024-01-02 DIAGNOSIS — K219 Gastro-esophageal reflux disease without esophagitis: Secondary | ICD-10-CM | POA: Diagnosis present

## 2024-01-02 DIAGNOSIS — E1169 Type 2 diabetes mellitus with other specified complication: Secondary | ICD-10-CM | POA: Diagnosis present

## 2024-01-02 DIAGNOSIS — M25562 Pain in left knee: Secondary | ICD-10-CM | POA: Diagnosis present

## 2024-01-02 DIAGNOSIS — Z79899 Other long term (current) drug therapy: Secondary | ICD-10-CM | POA: Diagnosis not present

## 2024-01-02 DIAGNOSIS — I5023 Acute on chronic systolic (congestive) heart failure: Secondary | ICD-10-CM | POA: Diagnosis present

## 2024-01-02 DIAGNOSIS — F1910 Other psychoactive substance abuse, uncomplicated: Secondary | ICD-10-CM | POA: Diagnosis present

## 2024-01-02 DIAGNOSIS — I110 Hypertensive heart disease with heart failure: Secondary | ICD-10-CM | POA: Diagnosis present

## 2024-01-02 DIAGNOSIS — Z89611 Acquired absence of right leg above knee: Secondary | ICD-10-CM | POA: Diagnosis not present

## 2024-01-02 DIAGNOSIS — I5020 Unspecified systolic (congestive) heart failure: Secondary | ICD-10-CM

## 2024-01-02 DIAGNOSIS — E782 Mixed hyperlipidemia: Secondary | ICD-10-CM | POA: Diagnosis present

## 2024-01-02 DIAGNOSIS — R7989 Other specified abnormal findings of blood chemistry: Secondary | ICD-10-CM

## 2024-01-02 DIAGNOSIS — I5021 Acute systolic (congestive) heart failure: Secondary | ICD-10-CM | POA: Diagnosis not present

## 2024-01-02 DIAGNOSIS — Z7151 Drug abuse counseling and surveillance of drug abuser: Secondary | ICD-10-CM

## 2024-01-02 DIAGNOSIS — D649 Anemia, unspecified: Secondary | ICD-10-CM | POA: Diagnosis present

## 2024-01-02 DIAGNOSIS — G894 Chronic pain syndrome: Secondary | ICD-10-CM | POA: Diagnosis present

## 2024-01-02 DIAGNOSIS — R7401 Elevation of levels of liver transaminase levels: Secondary | ICD-10-CM | POA: Diagnosis present

## 2024-01-02 DIAGNOSIS — Z91010 Allergy to peanuts: Secondary | ICD-10-CM

## 2024-01-02 DIAGNOSIS — I2489 Other forms of acute ischemic heart disease: Secondary | ICD-10-CM | POA: Diagnosis present

## 2024-01-02 DIAGNOSIS — R9431 Abnormal electrocardiogram [ECG] [EKG]: Secondary | ICD-10-CM | POA: Diagnosis present

## 2024-01-02 DIAGNOSIS — J441 Chronic obstructive pulmonary disease with (acute) exacerbation: Secondary | ICD-10-CM | POA: Diagnosis present

## 2024-01-02 DIAGNOSIS — F1410 Cocaine abuse, uncomplicated: Secondary | ICD-10-CM | POA: Diagnosis present

## 2024-01-02 DIAGNOSIS — Z89512 Acquired absence of left leg below knee: Secondary | ICD-10-CM | POA: Diagnosis not present

## 2024-01-02 DIAGNOSIS — J9601 Acute respiratory failure with hypoxia: Principal | ICD-10-CM

## 2024-01-02 DIAGNOSIS — I10 Essential (primary) hypertension: Secondary | ICD-10-CM | POA: Diagnosis not present

## 2024-01-02 DIAGNOSIS — Z813 Family history of other psychoactive substance abuse and dependence: Secondary | ICD-10-CM

## 2024-01-02 DIAGNOSIS — E1165 Type 2 diabetes mellitus with hyperglycemia: Secondary | ICD-10-CM | POA: Diagnosis present

## 2024-01-02 DIAGNOSIS — Z86718 Personal history of other venous thrombosis and embolism: Secondary | ICD-10-CM

## 2024-01-02 DIAGNOSIS — F17210 Nicotine dependence, cigarettes, uncomplicated: Secondary | ICD-10-CM | POA: Diagnosis present

## 2024-01-02 DIAGNOSIS — F250 Schizoaffective disorder, bipolar type: Secondary | ICD-10-CM

## 2024-01-02 DIAGNOSIS — F259 Schizoaffective disorder, unspecified: Secondary | ICD-10-CM | POA: Diagnosis present

## 2024-01-02 DIAGNOSIS — M79604 Pain in right leg: Secondary | ICD-10-CM | POA: Diagnosis not present

## 2024-01-02 DIAGNOSIS — N179 Acute kidney failure, unspecified: Secondary | ICD-10-CM | POA: Diagnosis not present

## 2024-01-02 DIAGNOSIS — Z888 Allergy status to other drugs, medicaments and biological substances status: Secondary | ICD-10-CM | POA: Diagnosis not present

## 2024-01-02 DIAGNOSIS — M25561 Pain in right knee: Secondary | ICD-10-CM | POA: Diagnosis present

## 2024-01-02 DIAGNOSIS — M79605 Pain in left leg: Secondary | ICD-10-CM

## 2024-01-02 DIAGNOSIS — J449 Chronic obstructive pulmonary disease, unspecified: Secondary | ICD-10-CM | POA: Diagnosis not present

## 2024-01-02 DIAGNOSIS — Z91148 Patient's other noncompliance with medication regimen for other reason: Secondary | ICD-10-CM

## 2024-01-02 LAB — URINALYSIS, W/ REFLEX TO CULTURE (INFECTION SUSPECTED)
Bacteria, UA: NONE SEEN
Glucose, UA: NEGATIVE mg/dL
Hgb urine dipstick: NEGATIVE
Ketones, ur: NEGATIVE mg/dL
Leukocytes,Ua: NEGATIVE
Nitrite: NEGATIVE
Protein, ur: 100 mg/dL — AB
Specific Gravity, Urine: >1.03 — ABNORMAL HIGH (ref 1.005–1.030)
pH: 5.5 (ref 5.0–8.0)

## 2024-01-02 LAB — RAPID URINE DRUG SCREEN, HOSP PERFORMED
Amphetamines: NOT DETECTED
Barbiturates: NOT DETECTED
Benzodiazepines: NOT DETECTED
Cocaine: POSITIVE — AB
Opiates: NOT DETECTED
Tetrahydrocannabinol: NOT DETECTED

## 2024-01-02 LAB — I-STAT VENOUS BLOOD GAS, ED
Acid-Base Excess: 2 mmol/L (ref 0.0–2.0)
Bicarbonate: 25.4 mmol/L (ref 20.0–28.0)
Calcium, Ion: 1.1 mmol/L — ABNORMAL LOW (ref 1.15–1.40)
HCT: 39 % (ref 39.0–52.0)
Hemoglobin: 13.3 g/dL (ref 13.0–17.0)
O2 Saturation: 100 %
Potassium: 4.9 mmol/L (ref 3.5–5.1)
Sodium: 138 mmol/L (ref 135–145)
TCO2: 26 mmol/L (ref 22–32)
pCO2, Ven: 36.1 mm[Hg] — ABNORMAL LOW (ref 44–60)
pH, Ven: 7.454 — ABNORMAL HIGH (ref 7.25–7.43)
pO2, Ven: 175 mm[Hg] — ABNORMAL HIGH (ref 32–45)

## 2024-01-02 LAB — COMPREHENSIVE METABOLIC PANEL
ALT: 56 U/L — ABNORMAL HIGH (ref 0–44)
AST: 70 U/L — ABNORMAL HIGH (ref 15–41)
Albumin: 3.3 g/dL — ABNORMAL LOW (ref 3.5–5.0)
Alkaline Phosphatase: 111 U/L (ref 38–126)
Anion gap: 13 (ref 5–15)
BUN: 19 mg/dL (ref 6–20)
CO2: 21 mmol/L — ABNORMAL LOW (ref 22–32)
Calcium: 8.8 mg/dL — ABNORMAL LOW (ref 8.9–10.3)
Chloride: 103 mmol/L (ref 98–111)
Creatinine, Ser: 1.22 mg/dL (ref 0.61–1.24)
GFR, Estimated: >60 mL/min (ref >60)
Glucose, Bld: 176 mg/dL — ABNORMAL HIGH (ref 70–99)
Potassium: 4.6 mmol/L (ref 3.5–5.1)
Sodium: 137 mmol/L (ref 135–145)
Total Bilirubin: 2 mg/dL — ABNORMAL HIGH (ref 0.0–1.2)
Total Protein: 6.5 g/dL (ref 6.5–8.1)

## 2024-01-02 LAB — CBC WITH DIFFERENTIAL/PLATELET
Abs Immature Granulocytes: 0.02 10*3/uL (ref 0.00–0.07)
Basophils Absolute: 0 10*3/uL (ref 0.0–0.1)
Basophils Relative: 1 %
Eosinophils Absolute: 0 10*3/uL (ref 0.0–0.5)
Eosinophils Relative: 0 %
HCT: 39.7 % (ref 39.0–52.0)
Hemoglobin: 12.9 g/dL — ABNORMAL LOW (ref 13.0–17.0)
Immature Granulocytes: 0 %
Lymphocytes Relative: 30 %
Lymphs Abs: 2 10*3/uL (ref 0.7–4.0)
MCH: 32.4 pg (ref 26.0–34.0)
MCHC: 32.5 g/dL (ref 30.0–36.0)
MCV: 99.7 fL (ref 80.0–100.0)
Monocytes Absolute: 0.5 10*3/uL (ref 0.1–1.0)
Monocytes Relative: 8 %
Neutro Abs: 4 10*3/uL (ref 1.7–7.7)
Neutrophils Relative %: 61 %
Platelets: 263 10*3/uL (ref 150–400)
RBC: 3.98 MIL/uL — ABNORMAL LOW (ref 4.22–5.81)
RDW: 15.8 % — ABNORMAL HIGH (ref 11.5–15.5)
WBC: 6.5 10*3/uL (ref 4.0–10.5)
nRBC: 0 % (ref 0.0–0.2)

## 2024-01-02 LAB — RESP PANEL BY RT-PCR (RSV, FLU A&B, COVID)  RVPGX2
Influenza A by PCR: NEGATIVE
Influenza B by PCR: NEGATIVE
Resp Syncytial Virus by PCR: NEGATIVE
SARS Coronavirus 2 by RT PCR: NEGATIVE

## 2024-01-02 LAB — HEMOGLOBIN A1C
Hgb A1c MFr Bld: 5.8 % — ABNORMAL HIGH (ref 4.8–5.6)
Mean Plasma Glucose: 119.76 mg/dL

## 2024-01-02 LAB — TROPONIN I (HIGH SENSITIVITY)
Troponin I (High Sensitivity): 476 ng/L (ref <18)
Troponin I (High Sensitivity): 424 ng/L (ref <18)

## 2024-01-02 MED ORDER — LORAZEPAM 2 MG/ML IJ SOLN
0.5000 mg | Freq: Four times a day (QID) | INTRAMUSCULAR | Status: DC | PRN
  Administered 2024-01-03 – 2024-01-06 (×3): 0.5 mg via INTRAVENOUS
  Filled 2024-01-02 (×3): qty 1

## 2024-01-02 MED ORDER — GUAIFENESIN ER 600 MG PO TB12: 600.0000 mg | ORAL_TABLET | Freq: Two times a day (BID) | ORAL | Status: DC

## 2024-01-02 MED ORDER — ARIPIPRAZOLE 5 MG PO TABS
5.0000 mg | ORAL_TABLET | Freq: Every day | ORAL | Status: DC
  Administered 2024-01-02 – 2024-01-06 (×5): 5 mg via ORAL
  Filled 2024-01-02 (×5): qty 1

## 2024-01-02 MED ORDER — GUAIFENESIN ER 600 MG PO TB12
600.0000 mg | ORAL_TABLET | Freq: Two times a day (BID) | ORAL | Status: DC
  Administered 2024-01-02 – 2024-01-06 (×8): 600 mg via ORAL
  Filled 2024-01-02 (×8): qty 1

## 2024-01-02 MED ORDER — ALBUTEROL SULFATE (2.5 MG/3ML) 0.083% IN NEBU
2.5000 mg | INHALATION_SOLUTION | RESPIRATORY_TRACT | Status: DC | PRN
  Administered 2024-01-02 – 2024-01-03 (×4): 2.5 mg via RESPIRATORY_TRACT
  Filled 2024-01-02 (×4): qty 3

## 2024-01-02 MED ORDER — CARVEDILOL 12.5 MG PO TABS
12.5000 mg | ORAL_TABLET | Freq: Two times a day (BID) | ORAL | Status: DC
  Administered 2024-01-02 – 2024-01-06 (×8): 12.5 mg via ORAL
  Filled 2024-01-02 (×9): qty 1

## 2024-01-02 MED ORDER — METHYLPREDNISOLONE SODIUM SUCC 40 MG IJ SOLR
40.0000 mg | Freq: Two times a day (BID) | INTRAMUSCULAR | Status: DC
  Administered 2024-01-03 – 2024-01-06 (×6): 40 mg via INTRAVENOUS
  Filled 2024-01-02 (×7): qty 1

## 2024-01-02 MED ORDER — METHYLPREDNISOLONE SODIUM SUCC 125 MG IJ SOLR
125.0000 mg | Freq: Once | INTRAMUSCULAR | Status: AC
  Administered 2024-01-02: 125 mg via INTRAVENOUS
  Filled 2024-01-02: qty 2

## 2024-01-02 MED ORDER — TRIMETHOBENZAMIDE HCL 100 MG/ML IM SOLN: 200.0000 mg | Freq: Four times a day (QID) | INTRAMUSCULAR | Status: DC | PRN

## 2024-01-02 MED ORDER — IPRATROPIUM-ALBUTEROL 0.5-2.5 (3) MG/3ML IN SOLN
3.0000 mL | Freq: Four times a day (QID) | RESPIRATORY_TRACT | Status: DC
  Administered 2024-01-03 – 2024-01-06 (×13): 3 mL via RESPIRATORY_TRACT
  Filled 2024-01-02 (×13): qty 3

## 2024-01-02 MED ORDER — ENOXAPARIN SODIUM 40 MG/0.4ML IJ SOSY
40.0000 mg | PREFILLED_SYRINGE | INTRAMUSCULAR | Status: DC
  Administered 2024-01-02: 40 mg via SUBCUTANEOUS
  Filled 2024-01-02 (×4): qty 0.4

## 2024-01-02 MED ORDER — FUROSEMIDE 10 MG/ML IJ SOLN
60.0000 mg | Freq: Once | INTRAMUSCULAR | Status: AC
  Administered 2024-01-02: 60 mg via INTRAVENOUS
  Filled 2024-01-02: qty 6

## 2024-01-02 MED ORDER — SODIUM CHLORIDE 0.9% FLUSH
3.0000 mL | Freq: Two times a day (BID) | INTRAVENOUS | Status: DC
  Administered 2024-01-02 – 2024-01-06 (×7): 3 mL via INTRAVENOUS

## 2024-01-02 MED ORDER — PANTOPRAZOLE SODIUM 40 MG PO TBEC
40.0000 mg | DELAYED_RELEASE_TABLET | Freq: Every day | ORAL | Status: DC
Start: 2024-01-02 — End: 2024-01-06
  Administered 2024-01-02 – 2024-01-06 (×4): 40 mg via ORAL
  Filled 2024-01-02 (×5): qty 1

## 2024-01-02 MED ORDER — OXYCODONE HCL ER 10 MG PO T12A
20.0000 mg | EXTENDED_RELEASE_TABLET | Freq: Two times a day (BID) | ORAL | Status: DC
  Administered 2024-01-02 – 2024-01-06 (×9): 20 mg via ORAL
  Filled 2024-01-02 (×9): qty 2

## 2024-01-02 MED ORDER — HYDROXYZINE HCL 10 MG PO TABS
10.0000 mg | ORAL_TABLET | Freq: Three times a day (TID) | ORAL | Status: DC | PRN
  Administered 2024-01-02 – 2024-01-06 (×3): 10 mg via ORAL
  Filled 2024-01-02 (×3): qty 1

## 2024-01-02 NOTE — ED Notes (Addendum)
Pt states he needs supplemental oxygen and a nebulizer machine/meds at home and believes this would decrease the frequency of his visits to the ER. He would also benefit from a slide board to assist with transfers to and from his wheelchair.

## 2024-01-02 NOTE — H&P (Signed)
History and Physical    Patient: [name] VWU:[id number] DOB: 1966/10/04 DOA: 01/02/2024 DOS: the patient was seen and examined on 01/02/2024 PCP: Georganna Skeans, MD  Patient coming from: Via EMS  Chief Complaint:  Chief Complaint  Patient presents with   Shortness of Breath   HPI: [name] is a 57 y.o. male with medical history significant of  Schizoaffective disorder, heart failure with reduced ejection fraction, pretension, diabetes mellitus type 2, COPD, s/p bilateral amputations, chronic pain, and polysubstance abuse who presents with shortness of breath.  Records note patient had just been hospitalized from 1/19-1/21 for acute on chronic heart failure exacerbation with reduced ejection fraction.  Thought secondary to poor compliance with medications and cocaine abuse.  He reports that over the last week he has had progressively worsening shortness of breath.  Reports having a cough in addition to the left leg swelling and pain related to his prior amputations.  He reports having some left-sided chest pain and discomfort as well.  Denies having any significant fever.  In the emergency department patient was noted to be afebrile tachypnea and blood pressure elevated 164/113.  He was initially transition from CPAP to BiPAP.  However patient took this off after being on it for an hour.  Labs significant for glucose 176, BUN 19, creatinine 1.22, AST 70, ALT 56, total bilirubin 2,  and troponin 476.  Chest x-ray noted stable cardiomegaly with no active disease.  Influenza, COVID-19, and RSV screening were negative.  Patient had been given Lasix 60 mg IV x 1 dose.  Review of Systems: As mentioned in the history of present illness. All other systems reviewed and are negative. Past Medical History:  Diagnosis Date   Asthma    Bipolar 1 disorder (HCC)    CHF (congestive heart failure) (HCC)    COPD (chronic obstructive pulmonary disease) (HCC)    Depression    Diabetes mellitus  without complication (HCC)    GERD (gastroesophageal reflux disease)    Homelessness    Hypertension    QT prolongation    Schizoaffective disorder (HCC)    Past Surgical History:  Procedure Laterality Date   AMPUTATION Left 12/10/2022   Procedure: LEFT BELOW KNEE AMPUTATION;  Surgeon: Nadara Mustard, MD;  Location: [facility] OR;  Service: Orthopedics;  Laterality: Left;   AMPUTATION TOE Bilateral 01/02/2021   Procedure: AMPUTATION TOE;  Surgeon: Park Liter, DPM;  Location: [facility] OR;  Service: Podiatry;  Laterality: Bilateral;  Ok to leave patient on hospital bed   IR ANGIO INTRA EXTRACRAN SEL COM CAROTID INNOMINATE BILAT MOD SED  12/17/2020   IR ANGIO VERTEBRAL SEL VERTEBRAL BILAT MOD SED  12/17/2020   MULTIPLE EXTRACTIONS WITH ALVEOLOPLASTY N/A 03/25/2023   Procedure: IRRIGATION AND DEBRIDEMENT, MULTIPLE EXTRACTION WITH ALVEOLOPLASTY X 4 , EXTRACTIONS 4 THROUGH 13, 20 THROUGHT 29;  Surgeon: Vivia Ewing, DMD;  Location: [facility] OR;  Service: Oral Surgery;  Laterality: N/A;   RADIOLOGY WITH ANESTHESIA N/A 12/17/2020   Procedure: IR WITH ANESTHESIA;  Surgeon: Radiologist, Medication, MD;  Location: [facility] OR;  Service: Radiology;  Laterality: N/A;   Social History:  reports that he has been smoking cigarettes. He has never used smokeless tobacco. He reports current alcohol use. He reports current drug use. Drug: Cocaine.  Allergies  Allergen Reactions   Acetaminophen Hives, Nausea And Vomiting, Rash and Anaphylaxis    PT states that if he takes this he is going to the hospital   Motrin [Ibuprofen] Anaphylaxis   Nsaids Anaphylaxis  PT states that if he takes this he is going to the hospital   Lyrica Cr [Pregabalin Er] Other (See Comments)    Dystonia Dysarthria    Peanut-Containing Drug Products Other (See Comments)    Pt denies allergy to peanut butter 03/02/2021    Family History  Problem Relation Age of Onset   Drug abuse Mother    Suicidality Mother     Prior to Admission medications    Medication Sig Start Date End Date Taking? Authorizing Provider  ARIPiprazole (ABILIFY) 5 MG tablet Take 1 tablet (5 mg total) by mouth daily. 12/23/23   Rhetta Mura, MD  Baclofen 5 MG TABS Take 1 tablet (5 mg total) by mouth 3 (three) times daily as needed. 10/28/23   Georganna Skeans, MD  carvedilol (COREG) 12.5 MG tablet Take 1 tablet (12.5 mg total) by mouth 2 (two) times daily with a meal. 12/22/23 01/21/24  Rhetta Mura, MD  furosemide (LASIX) 20 MG tablet Take 2 tablets (40 mg total) by mouth 2 (two) times daily. 12/22/23   Rhetta Mura, MD  gabapentin (NEURONTIN) 300 MG capsule Take 3 capsules (900 mg total) by mouth 3 (three) times daily. 10/28/23 11/27/23  Georganna Skeans, MD  hydrOXYzine (ATARAX) 10 MG tablet Take 1 tablet (10 mg total) by mouth 3 (three) times daily as needed for anxiety. 12/22/23   Rhetta Mura, MD  oxyCODONE (OXYCONTIN) 10 mg 12 hr tablet Take 2 tablets (20 mg total) by mouth every 12 (twelve) hours. 12/31/23   Rhetta Mura, MD  VENTOLIN HFA 108 (90 Base) MCG/ACT inhaler INHALE TWO PUFFS INTO THE LUNGS EVERY 6 (SIX) HOURS AS NEEDED FOR WHEEZING OR SHORTNESS OF BREATH 12/18/23   Georganna Skeans, MD    Physical Exam: Vitals:   01/02/24 1235 01/02/24 1240 01/02/24 1242 01/02/24 1330  BP: (!) 164/113 (!) 164/113  (!) 150/117  Pulse: 93 96  93  Resp: (!) 35 (!) 32  (!) 24  Temp:   (!) 97.3 F (36.3 C)   TempSrc:   Axillary   SpO2: 100% 96%  100%  Weight:       Constitutional: Middle-age male who appears chronically ill Eyes: PERRL, lids and conjunctivae normal ENMT: Mucous membranes are moist.  Normal dentition.  Neck: normal, supple, JVD present. Respiratory: Decreased aeration with some crackles appreciated and mild expiratory wheeze. Cardiovascular: Regular rate and rhythm, no murmurs / rubs / gallops. No extremity edema. 2+ pedal pulses. No carotid bruits.  Abdomen: no tenderness, no masses palpated. No hepatosplenomegaly.  Bowel sounds positive.  Musculoskeletal: no clubbing / cyanosis. No joint deformity upper and lower extremities. Good ROM, no contractures. Normal muscle tone.  Skin: no rashes, lesions, ulcers. No induration Neurologic: CN 2-12 grossly intact. Sensation intact, DTR normal. Strength 5/5 in all 4.  Psychiatric: Normal judgment and insight. Alert and oriented x 3. Normal mood.   Data Reviewed:  EKG reveals sinus rhythm at 98 bpm with prolonged QT interval of 514.  Reviewed labs, imaging, and pertinent records as documented.  Assessment and Plan:  Acute respiratory failure with hypoxia Acute on chronic heart failure with reduced ejection fraction Patient presents with acute shortness of breath.  Initially placed on BiPAP while in the emergency department.   Last echocardiogram noted EF to be less than 20% with global hypokinesis and grade 2 diastolic dysfunction from last TEE from August 2023.  Patient had initially been given Lasix 60 mg IV x 1 dose. -Admit to a telemetry bed -Continuous pulse oximetry with  nasal cannula oxygen maintain O2 saturations -Strict I&O's and daily weights -Follow-up BNP -Check echocardiogram given elevated  Elevated troponin Acute on chronic.  Patient reports having some left-sided chest pain.  High-sensitivity troponin elevated at 476.  This is higher than previous check 110->99 on 1/19.  Suspect secondary to demand. -Check repeat troponin -Follow-up echocardiogram  Prolonged QT interval QTc was noted to be 514. -Avoid QT prolonging medication -Correct electrolyte abnormalities  COPD, without exacerbation Patient noted to have some mild expiratory wheezes on physical exam -Albuterol nebs as needed  Essential hypertension Initial blood pressure was elevated up to 164/113. -Continue Coreg  Diabetes mellitus type 2 with hyperglycemia, not on long-term insulin Mixed hyperlipidemia Admission glucose noted to be 176.  During last hospitalization patient  declined any blood sugar checks.  Once available hemoglobin A1c was noted to be 11.2 when checked back on 12/03/2022. -Hypoglycemic protocols -Check hemoglobin A1c -CBGs before every meal with sensitive SSI if patient  will allow  Schizoaffective disorder Patient had been started on low-dose Abilify during last hospitalization. -Resume Abilify  Normocytic anemia Hemoglobin 12.9 which appears similar to prior. -Continue to monitor  Left below-knee amputation  Right above-knee amputation Chronic bilateral lower extremity pain Patient reports having severe pain from his previous amputations. -Continue gabapentin, baclofen, and oxycodone    History of DVT/thromboembolism Patient had previously declined anticoagulation. -Continue to engage patient on the need of anticoagulation given history  Transaminitis Possibly secondary to patient being acutely fluid overloaded.  Polysubstance abuse With history of cocaine abuse. -Check urine drug screen  DVT prophylaxis: Lovenox Advance Care Planning:   Code Status: Prior  Consults: None  Family Communication: None  Severity of Illness: The appropriate patient status for this patient is INPATIENT. Inpatient status is judged to be reasonable and necessary in order to provide the required intensity of service to ensure the patient's safety. The patient's presenting symptoms, physical exam findings, and initial radiographic and laboratory data in the context of their chronic comorbidities is felt to place them at high risk for further clinical deterioration. Furthermore, it is not anticipated that the patient will be medically stable for discharge from the hospital within 2 midnights of admission.   * I certify that at the point of admission it is my clinical judgment that the patient will require inpatient hospital care spanning beyond 2 midnights from the point of admission due to high intensity of service, high risk for further deterioration and  high frequency of surveillance required.*  Author: Clydie Braun, MD 01/02/2024 2:34 PM  For on call review [email or web address].

## 2024-01-02 NOTE — ED Notes (Signed)
Called CCMD at [phone] to verify pt is being monitored.

## 2024-01-02 NOTE — Progress Notes (Addendum)
Patient's RN reported that patient has increased work of breathing and wheezing on physical exam.  However patient O2 sat 100% on room air and hemodynamically stable.  Per review patient has been admitted for acute hypoxic respiratory failure due to acute on chronic CHF exacerbation.  EF is 20%.  Patient received 1 dose of Lasix 60 mg.  Pain BNP and echocardiogram. Initial chest x-ray did not showed any volume overload.  It is seems like that patient would likely have COPD exacerbation 10 CHF exacerbation at this time but it is very hard to deter mine.  Initially patient was on BiPAP in the ED but given he was not comfortable and declined to use it has been taken off after a while. -Given physical exam showing wheezing starting scheduled DuoNeb and IV Solu-Medrol.  Holding azithromycin as patient has prolonged QTc.  Continue pulmonary toiletry and supportive care. -Obtaining stat chest x-ray and checking VBG. -Continue Ventimask and goal to keep oxygen above 92%..  Based on the VBG will decide to start BiPAP. -If chest x-ray show any pulmonary vascular congestion we will give another dose of Lasix.  Update, chest x-ray showing stable cardiomegaly without any evidence of pulmonary vascular congestion. Plan to continue to manage as above for COPD exacerbation.  Tereasa Coop, MD [facility] 01/02/2024, 10:29 PM

## 2024-01-02 NOTE — ED Notes (Signed)
Pt refused ABG despite several attempts to convince him to allow the draw.

## 2024-01-02 NOTE — ED Notes (Signed)
Pt again called out to notify RN that he feels like he can't breathe. He previously refused BiPap with RT at bedside; RN informed him that the next step to assist with his breathing is BiPap at this point. Pt continued to say that he cannot breathe.  Called RT to request application of BiPap.

## 2024-01-02 NOTE — ED Notes (Addendum)
Pt is increasingly dyspneic with even slight exertion. After albuterol treatment, pt requested supplemental O2. RN placed pt on 2L for comfort and called RT to request additional intervention. Notified hospitalist and will continue to monitor and await new orders.

## 2024-01-02 NOTE — Progress Notes (Signed)
Received patient from EMS on CPAP. Placed on BIPAP. MD at bedside.

## 2024-01-02 NOTE — Progress Notes (Signed)
Placed pt. On bipap due to respiratory distress.

## 2024-01-02 NOTE — ED Triage Notes (Signed)
Pt SHOB for a week. Hx of COPD and CHF. Pt is non compliant with furosemide. Pt arrives on cpap. C/O left leg pain. EMS gave 2grams mag, 125 solu medrol, 10mg  of albuterol, and 1 Atrovent. 0.4mg  of nittro.

## 2024-01-02 NOTE — ED Provider Notes (Incomplete)
Emergency Department Provider Note   I have reviewed the triage vital signs and the nursing notes.   HISTORY  Chief Complaint Shortness of Breath   [name] is a 58 y.o. male ***   {**SYMPTOM/COMPLAINT  LOCATION (describe anatomically) DURATION (when did it start) TIMING (onset and pattern) SEVERITY (0-10, mild/moderate/severe) QUALITY (description of symptoms) CONTEXT (recent surgery, new meds, activity, etc.) MODIFYINGFACTORS (what makes it better/worse) ASSOCIATEDSYMPTOMS (pertinent positives and negatives)**}  Past Medical History:  Diagnosis Date   Asthma    Bipolar 1 disorder (HCC)    CHF (congestive heart failure) (HCC)    COPD (chronic obstructive pulmonary disease) (HCC)    Depression    Diabetes mellitus without complication (HCC)    GERD (gastroesophageal reflux disease)    Homelessness    Hypertension    QT prolongation    Schizoaffective disorder (HCC)     Review of Systems {** Revise as appropriate then delete this line - Documentation of 10 systems OR 2 systems and "10-point ROS otherwise negative" is required **}Constitutional: No fever/chills Eyes: No visual changes. ENT: No sore throat. Cardiovascular: Denies chest pain. Respiratory: Denies shortness of breath. Gastrointestinal: No abdominal pain.  No nausea, no vomiting.  No diarrhea.  No constipation. Genitourinary: Negative for dysuria. Musculoskeletal: Negative for back pain. Skin: Negative for rash. Neurological: Negative for headaches, focal weakness or numbness. {**Psychiatric:  Endocrine:  Hematological/Lymphatic:  Allergic/Immunilogical: **}  ____________________________________________   PHYSICAL EXAM:  VITAL SIGNS: ED Triage Vitals  Encounter Vitals Group     BP 01/02/24 1235 (!) 164/113     Systolic BP Percentile --      Diastolic BP Percentile --      Pulse Rate 01/02/24 1235 93     Resp 01/02/24 1235 (!) 35     Temp 01/02/24 1242 (!) 97.3 F (36.3 C)      Temp Source 01/02/24 1242 Axillary     SpO2 01/02/24 1235 100 %     Weight 01/02/24 1231 190 lb (86.2 kg)     Height --      Head Circumference --      Peak Flow --      Pain Score 01/02/24 1231 9     Pain Loc --      Pain Education --      Exclude from Growth Chart --    {** Revise as appropriate then delete this line - 8 systems required **} Constitutional: Alert and oriented. Well appearing and in no acute distress. Eyes: Conjunctivae are normal. PERRL. EOMI. Head: Atraumatic. {**Ears:  Healthy appearing ear canals and TMs bilaterally **}Nose: No congestion/rhinnorhea. Mouth/Throat: Mucous membranes are moist.  Oropharynx non-erythematous. Neck: No stridor.  No meningeal signs.  {**No cervical spine tenderness to palpation.**} Cardiovascular: Normal rate, regular rhythm. Good peripheral circulation. Grossly normal heart sounds.   Respiratory: Normal respiratory effort.  No retractions. Lungs CTAB. Gastrointestinal: Soft and nontender. No distention.  {**Genitourinary:  **}Musculoskeletal: No lower extremity tenderness nor edema. No gross deformities of extremities. Neurologic:  Normal speech and language. No gross focal neurologic deficits are appreciated.  Skin:  Skin is warm, dry and intact. No rash noted. {**Psychiatric: Mood and affect are normal. Speech and behavior are normal.**}  ____________________________________________   LABS (all labs ordered are listed, but only abnormal results are displayed)  Labs Reviewed  COMPREHENSIVE METABOLIC PANEL - Abnormal; Notable for the following components:      Result Value   CO2 21 (*)    Glucose, Bld  176 (*)    Calcium 8.8 (*)    Albumin 3.3 (*)    AST 70 (*)    ALT 56 (*)    Total Bilirubin 2.0 (*)    All other components within normal limits  CBC WITH DIFFERENTIAL/PLATELET - Abnormal; Notable for the following components:   RBC 3.98 (*)    Hemoglobin 12.9 (*)    RDW 15.8 (*)    All other components within normal  limits  TROPONIN I (HIGH SENSITIVITY) - Abnormal; Notable for the following components:   Troponin I (High Sensitivity) 476 (*)    All other components within normal limits  RESP PANEL BY RT-PCR (RSV, FLU A&B, COVID)  RVPGX2  BRAIN NATRIURETIC PEPTIDE  URINALYSIS, W/ REFLEX TO CULTURE (INFECTION SUSPECTED)   ____________________________________________  EKG  *** ____________________________________________  RADIOLOGY  DG Chest Portable 1 View Result Date: 01/02/2024 CLINICAL DATA:  Shortness of breath. EXAM: PORTABLE CHEST 1 VIEW COMPARISON:  December 20, 2023. FINDINGS: Stable cardiomegaly. Both lungs are clear. The visualized skeletal structures are unremarkable. IMPRESSION: No active disease. Electronically Signed   By: Lupita Raider M.D.   On: 01/02/2024 13:13    ____________________________________________   PROCEDURES  Procedure(s) performed:   Procedures  CRITICAL CARE Performed by: Maia Plan Total critical care time: *** minutes Critical care time was exclusive of separately billable procedures and treating other patients. Critical care was necessary to treat or prevent imminent or life-threatening deterioration. Critical care was time spent personally by me on the following activities: development of treatment plan with patient and/or surrogate as well as nursing, discussions with consultants, evaluation of patient's response to treatment, examination of patient, obtaining history from patient or surrogate, ordering and performing treatments and interventions, ordering and review of laboratory studies, ordering and review of radiographic studies, pulse oximetry and re-evaluation of patient's condition.  Alona Bene, MD Emergency Medicine  ____________________________________________   INITIAL IMPRESSION / ASSESSMENT AND PLAN / ED COURSE  Pertinent labs & imaging results that were available during my care of the patient were reviewed by me and considered in my  medical decision making (see chart for details).   This patient is Presenting for Evaluation of ***, which {Range:23949} require a range of treatment options, and {MDMcomplaint:23950} a complaint that involves a {MDMlevelrisk:23951} risk of morbidity and mortality.  The Differential Diagnoses include***.  Critical Interventions-    Medications - No data to display  Reassessment after intervention:     I *** Additional Historical Information from ***, as the patient is ***.  I decided to review pertinent External Data, and in summary ***.   Clinical Laboratory Tests Ordered, included   Radiologic Tests Ordered, included ***. I independently interpreted the images and agree with radiology interpretation.   Cardiac Monitor Tracing which shows ***   Social Determinants of Health Risk ***  Consult complete with  Medical Decision Making: Summary: ***  Reevaluation with update and discussion with patient at 1:30 PM. Looking more comfortable. He has removed the BiPAP. We discussed keeping it in place longer but he would like to remove it. Looking comfortable for now. Transitioned to [place] O2.   ***Considered admission***  Patient's presentation is most consistent with acute presentation with potential threat to life or bodily function.   Disposition:   ____________________________________________  FINAL CLINICAL IMPRESSION(S) / ED DIAGNOSES  Final diagnoses:  None     NEW OUTPATIENT MEDICATIONS STARTED DURING THIS VISIT:  New Prescriptions   No medications on file  Note:  This document was prepared using Dragon voice recognition software and may include unintentional dictation errors.  Alona Bene, MD, [facility] Emergency Medicine

## 2024-01-02 NOTE — Progress Notes (Signed)
RT note. RT at bedside, patient removed bipap mask. MD at bedside patient stated he would place bipap back on. Patient refusing to place bipap on. Patient states he can not breath. RN aware

## 2024-01-03 ENCOUNTER — Encounter (HOSPITAL_COMMUNITY): Payer: Self-pay | Admitting: Internal Medicine

## 2024-01-03 ENCOUNTER — Inpatient Hospital Stay (HOSPITAL_COMMUNITY): Payer: MEDICAID

## 2024-01-03 DIAGNOSIS — I5020 Unspecified systolic (congestive) heart failure: Secondary | ICD-10-CM | POA: Diagnosis not present

## 2024-01-03 LAB — RENAL FUNCTION PANEL
Albumin: 3.1 g/dL — ABNORMAL LOW (ref 3.5–5.0)
Anion gap: 12 (ref 5–15)
BUN: 31 mg/dL — ABNORMAL HIGH (ref 6–20)
CO2: 21 mmol/L — ABNORMAL LOW (ref 22–32)
Calcium: 8.9 mg/dL (ref 8.9–10.3)
Chloride: 102 mmol/L (ref 98–111)
Creatinine, Ser: 1.38 mg/dL — ABNORMAL HIGH (ref 0.61–1.24)
GFR, Estimated: >60 mL/min (ref >60)
Glucose, Bld: 148 mg/dL — ABNORMAL HIGH (ref 70–99)
Phosphorus: 3.8 mg/dL (ref 2.5–4.6)
Potassium: 5 mmol/L (ref 3.5–5.1)
Sodium: 135 mmol/L (ref 135–145)

## 2024-01-03 LAB — MAGNESIUM: Magnesium: 2 mg/dL (ref 1.7–2.4)

## 2024-01-03 LAB — COMPREHENSIVE METABOLIC PANEL
ALT: 48 U/L — ABNORMAL HIGH (ref 0–44)
AST: 31 U/L (ref 15–41)
Albumin: 3.3 g/dL — ABNORMAL LOW (ref 3.5–5.0)
Alkaline Phosphatase: 108 U/L (ref 38–126)
Anion gap: 11 (ref 5–15)
BUN: 28 mg/dL — ABNORMAL HIGH (ref 6–20)
CO2: 20 mmol/L — ABNORMAL LOW (ref 22–32)
Calcium: 8.9 mg/dL (ref 8.9–10.3)
Chloride: 102 mmol/L (ref 98–111)
Creatinine, Ser: 1.24 mg/dL (ref 0.61–1.24)
GFR, Estimated: >60 mL/min (ref >60)
Glucose, Bld: 151 mg/dL — ABNORMAL HIGH (ref 70–99)
Potassium: 5.7 mmol/L — ABNORMAL HIGH (ref 3.5–5.1)
Sodium: 133 mmol/L — ABNORMAL LOW (ref 135–145)
Total Bilirubin: 1.5 mg/dL — ABNORMAL HIGH (ref 0.0–1.2)
Total Protein: 6.7 g/dL (ref 6.5–8.1)

## 2024-01-03 LAB — CBC
HCT: 40.5 % (ref 39.0–52.0)
Hemoglobin: 13.3 g/dL (ref 13.0–17.0)
MCH: 32.1 pg (ref 26.0–34.0)
MCHC: 32.8 g/dL (ref 30.0–36.0)
MCV: 97.8 fL (ref 80.0–100.0)
Platelets: 278 10*3/uL (ref 150–400)
RBC: 4.14 MIL/uL — ABNORMAL LOW (ref 4.22–5.81)
RDW: 15.6 % — ABNORMAL HIGH (ref 11.5–15.5)
WBC: 6.3 10*3/uL (ref 4.0–10.5)
nRBC: 0 % (ref 0.0–0.2)

## 2024-01-03 LAB — HIV ANTIBODY (ROUTINE TESTING W REFLEX): HIV Screen 4th Generation wRfx: NONREACTIVE

## 2024-01-03 MED ORDER — BUDESONIDE 0.25 MG/2ML IN SUSP
0.2500 mg | Freq: Two times a day (BID) | RESPIRATORY_TRACT | Status: DC
  Administered 2024-01-03 – 2024-01-06 (×5): 0.25 mg via RESPIRATORY_TRACT
  Filled 2024-01-03 (×6): qty 2

## 2024-01-03 MED ORDER — HYDROMORPHONE HCL 1 MG/ML IJ SOLN
0.5000 mg | INTRAMUSCULAR | Status: DC | PRN
  Administered 2024-01-03: 0.5 mg via INTRAVENOUS
  Filled 2024-01-03 (×2): qty 0.5

## 2024-01-03 MED ORDER — TORSEMIDE 20 MG PO TABS
20.0000 mg | ORAL_TABLET | Freq: Every day | ORAL | Status: DC
  Administered 2024-01-03 – 2024-01-06 (×4): 20 mg via ORAL
  Filled 2024-01-03 (×4): qty 1

## 2024-01-03 MED ORDER — HYDRALAZINE HCL 25 MG PO TABS: 25.0000 mg | ORAL_TABLET | Freq: Four times a day (QID) | ORAL | Status: DC | PRN

## 2024-01-03 MED ORDER — NIFEDIPINE ER OSMOTIC RELEASE 60 MG PO TB24
60.0000 mg | ORAL_TABLET | Freq: Every day | ORAL | Status: DC
  Administered 2024-01-03 – 2024-01-04 (×2): 60 mg via ORAL
  Filled 2024-01-03 (×2): qty 1

## 2024-01-03 NOTE — Progress Notes (Signed)
Attempted Echocardiogram, Patient refused and wants a breathing treatment before any test could be performed.

## 2024-01-03 NOTE — Evaluation (Signed)
Occupational Therapy Evaluation Patient Details Name: [name] MRN: [id number] DOB: Mar 08, 1966 Today's Date: 01/03/2024   History of Present Illness Pt is a 58 y.o. male presenting 01/02/24 with shortness of breath for 1 week. CXR stable cardiomegaly with no active disease. Recent hospitalization 1/19-1/21 for acute on chronic heart failure exacerbation. PMH significant of  Schizoaffective disorder, heart failure with reduced ejection fraction, pretension, DMII, COPD, s/p bilateral amputations, chronic pain, and polysubstance abuse   Clinical Impression   Home living an PLOF obtained through chart review as pt agitation prevented him for providing further information this session. Per chart review, pt was largely Independent to Mod I with ADLs from wheelchair level. OT eval largely completed this day through clinical observation of pt-directed activities from bed level as pt declining OOB activities and largely declining therapist-directed activities due to agitation, which was present upon arrival and increased during session. Pt currently presents with decreased balance, impaired safety awareness, decreased cognition (with pt attempting to make phone call with nurse call bell), impulsiveness, agitation, and decreased safety and independence with functional tasks. Pt demonstrating ability to self-feed Independently, perform UB dressing with Mod I, and utilize hand-held urinal for toileting with Set up. Pt also demonstrates Independence with bed mobility during ADLs. OT plans to further assess functional level, including functional transfers, during future skilled OT sessions. Pt will benefit from acute skilled OT services to address deficits outlined below and increase safety and independence with functional tasks. Post acute discharge, pt will benefit from continues skilled rehab services to maximize rehab potential. Current recommendation for intensive inpatient skilled rehab services < 3 hours per  day. However, discharge recommendation may be updated pending pt progress in acute rehab services.       If plan is discharge home, recommend the following: A lot of help with walking and/or transfers;A little help with bathing/dressing/bathroom;Assistance with cooking/housework;Direct supervision/assist for medications management;Direct supervision/assist for financial management;Assist for transportation;Help with stairs or ramp for entrance    Functional Status Assessment  Patient has had a recent decline in their functional status and demonstrates the ability to make significant improvements in function in a reasonable and predictable amount of time.  Equipment Recommendations  Other (comment);BSC/3in1;Wheelchair (measurements OT);Wheelchair cushion (measurements OT) (drop-arm bariatric BSC; other TBD)    Recommendations for Other Services       Precautions / Restrictions Precautions Precautions: Fall Precaution Comments: L BKA; R AKA Restrictions Weight Bearing Restrictions Per Provider Order: No      Mobility Bed Mobility Overal bed mobility: Independent             General bed mobility comments: Pt declined out of bed mobility but independently repositioned himself in long sitting, rolled to his R and L sides, and prop himself up in L sidelying.    Transfers                   General transfer comment: Pt declined out of bed mobility      Balance                                           ADL either performed or assessed with clinical judgement   ADL Overall ADL's : Needs assistance/impaired Eating/Feeding: Independent;Bed level               Upper Body Dressing : Modified independent;Bed level  Toileting- Clothing Manipulation and Hygiene: Set up;Bed level         General ADL Comments: Pt appears to be near baseline PLOF. However, difficult to fully assess pt functional level this session due to agitation.  Assessed through clinical observation of pt directed activities. [place] not observed this session, OT recommends Supervision for bathing and LB dressing at this time due to pt presenting with impulsiveness, impaired safety awareness, and mild noted balance deficits. OT plans to further assess functional level in future sessions.     Vision Patient Visual Report: Other (comment) (pt unable to report this session due to agitation)       Perception         Praxis         Pertinent Vitals/Pain Pain Assessment Pain Assessment: Faces Faces Pain Scale: No hurt Pain Intervention(s): Monitored during session     Extremity/Trunk Assessment Upper Extremity Assessment Upper Extremity Assessment: Overall WFL for tasks assessed (Assessed through clinical observation; B UE ROM, coordiantion, and strength appear WFL for tasks performed)   Lower Extremity Assessment Lower Extremity Assessment: Defer to PT evaluation RLE Deficits / Details: R knee disarticulation. Unable to formally assess d/t pt agitation LLE Deficits / Details: L BKA; Unable to formally assess d/t pt agitation   Cervical / Trunk Assessment Cervical / Trunk Assessment: Kyphotic   Communication Communication Communication: Difficulty communicating thoughts/reduced clarity of speech;Other (comment) (Pt unable to have reciprocal conversation with staff this session due to agitation.)   Cognition Arousal: Alert Behavior During Therapy: Agitated, Restless Overall Cognitive Status: History of cognitive impairments - at baseline                                 General Comments: Pt agitated upon OT/PT arrival with RN reporting frequent agitation throughout the day. During session, pt frequently speak over therapists and becoming increasing agitated throughout session, though therapists provided hand-held urinal requested by pt and RNs arriving to provide breathing treatment requested by pt. Pt attempting to make phone  call with nurse call bell during session.      General Comments  Pt agitated throughout session and frequently raised voice and spoke forcefully, but did not swear or make any inappropriate comments. Pt declining talking with or completing therapist directed activities, but active in self-directed activities from bed level throughout session. Pt reporting he will be agreeable to working with therapists "tomorrow."    Exercises     Shoulder Instructions      Home Living Family/patient expects to be discharged to:: Private residence Living Arrangements: Alone   Type of Home: Apartment Home Access: Other (comment) (unable to assess at this point)     Home Layout:  (unable to assess at this point)     Bathroom Shower/Tub:  (unable to assess at this point)   Bathroom Toilet:  (unable to assess at this point)     Home Equipment:  (unable to assess at this point)   Additional Comments: Information regarding home living and PLOF obtained through chart review as pt agitated during today's session and declining to provide history at this time.      Prior Functioning/Environment Prior Level of Function : Needs assist;Independent/Modified Independent             Mobility Comments: Per chart review, pt often requires assistance for functional mobility from w/c level due to difficulty with self-propulsion following prior CVA. Per chart review, pt  has L LE prosthesis and does not have prosthesis on the right.. ADLs Comments: Per chart review, pt completes ADLs Independent to Mod I from w/c level.        OT Problem List: Impaired balance (sitting and/or standing)      OT Treatment/Interventions: Self-care/ADL training;DME and/or AE instruction;Therapeutic activities;Cognitive remediation/compensation;Patient/family education;Balance training    OT Goals(Current goals can be found in the care plan section) Acute Rehab OT Goals Patient Stated Goal: to use the hand-held urinal and get  a breathing treatment OT Goal Formulation: Patient unable to participate in goal setting Time For Goal Achievement: 01/17/24 Potential to Achieve Goals: Fair ADL Goals Pt Will Perform Lower Body Bathing: with modified independence;sitting/lateral leans Pt Will Perform Lower Body Dressing: with modified independence;sitting/lateral leans Pt Will Transfer to Toilet: with supervision (with lateral scoot transfer with use of drop-arm bedside commode) Pt Will Perform Toileting - Clothing Manipulation and hygiene: with modified independence;sitting/lateral leans Additional ADL Goal #1: Patient will demonstrate ability to gather supplies in room for ADL task with Supervision from wheelchair level.  OT Frequency: Min 1X/week    Co-evaluation PT/OT/SLP Co-Evaluation/Treatment: Yes Reason for Co-Treatment: Necessary to address cognition/behavior during functional activity;Other (comment) (pt agitated) PT goals addressed during session: Mobility/safety with mobility;Balance OT goals addressed during session: ADL's and self-care      AM-PAC OT "6 Clicks" Daily Activity     Outcome Measure Help from another person eating meals?: None Help from another person taking care of personal grooming?: A Little Help from another person toileting, which includes using toliet, bedpan, or urinal?: A Little Help from another person bathing (including washing, rinsing, drying)?: A Little Help from another person to put on and taking off regular upper body clothing?: A Little Help from another person to put on and taking off regular lower body clothing?: A Little 6 Click Score: 19   End of Session Nurse Communication: Mobility status;Other (comment) (Pt agitated. Limited OT eval largely completed through clinical observation of pt-directed activities from bed level with OT planning to assess functional level further in future sessions.)  Activity Tolerance: Treatment limited secondary to agitation Patient left:  in bed;with call bell/phone within reach;with nursing/sitter in room  OT Visit Diagnosis: Other abnormalities of gait and mobility (R26.89)                Time: 1610-9604 OT Time Calculation (min): 8 min Charges:  OT General Charges $OT Visit: 1 Visit OT Evaluation $OT Eval Moderate Complexity: 1 Mod  Thurmon Mizell "[employer] OTR/L, MA Acute Rehab [phone]   Lendon Colonel 01/03/2024, 7:38 PM

## 2024-01-03 NOTE — Progress Notes (Signed)
Pt arrived to unit and is refusing CHG bath, gown change, pulse ox, skin assessment and head to toe assessment as well as admission questions and refusing all care. Dr. Sallee Provencal has been notified. Pt is c/o of pain but would not answer pain questions, per [facility] it was not time for next PRN dose. Pt did not know what he takes at home for pain when asked. Pt is requesting a consult with Dr. Lajoyce Corners orthopedic.

## 2024-01-03 NOTE — Progress Notes (Signed)
Patient's RN report following: ""Pt has removed BiPap mask and refuses to replace it at this time, however he states that he will be willing to wear it again if it becomes more difficult to breathe. Currently he remains dyspneic with audible expiratory wheezes but is able to converse in complete sentences and is not agitated. A/O x 4  RN provided education and assisted pt in developing a needs list for discharge planning purposes. Pt will need: Supplemental oxygen Nebulizer machine Rx for neb meds Slide board OT at home  He notes that he gets extremely winded and requires a prolonged recovery time any time he has to transfer himself to or from his wheelchair. He has only used a slide board a few times and would need additional training with PT for safety due to very high risk of falls. It is also notable that his wheelchair is broken and the back panel is nearly detached, further increasing this pt's risk for falls. Recommend replacement of wheelchair prior to returning home. """   -I have consulted transition care team for: Supplemental oxygen, nebulizer machine, slide board.   -Also consulting PT and OT to assess in the hospital and need for home PT and OT therapy.  Tereasa Coop, MD [facility] 01/03/2024, 1:27 AM

## 2024-01-03 NOTE — ED Notes (Signed)
Pt refuse to wear bipap. Pt refusing to wear pulsed ox. Explained benefits and risks.

## 2024-01-03 NOTE — ED Notes (Signed)
Pt agreed to replace BiPap at this time.

## 2024-01-03 NOTE — Progress Notes (Signed)
Patient's RN reported that patient is complaining about bilateral knee joint pain around the surgical area.  At home patient is on OxyContin 20 mg twice daily.  Patient reported pain is not well-controlled and requesting for some as needed medication.  RN reported that patient does not have any active wound or sign of infection.  However there is some nickel sized scab of the left BKA stump area.  Patient is afebrile and has no leukocytosis.  There is no suspicion for infection at this time.  If patient develops any extensive wound or infection in that case we will reach out to orthopedics for evaluation of this time.Marland Kitchen   Tereasa Coop, MD [facility] 01/03/2024, 10:10 PM

## 2024-01-03 NOTE — ED Notes (Signed)
Pt has removed BiPap mask and refuses to replace it at this time, however he states that he will be willing to wear it again if it becomes more difficult to breathe. Currently he remains dyspneic with audible expiratory wheezes but is able to converse in complete sentences and is not agitated. A/O x 4  RN provided education and assisted pt in developing a needs list for discharge planning purposes. Pt will need: Supplemental oxygen Nebulizer machine Rx for neb meds Slide board OT at home  He notes that he gets extremely winded and requires a prolonged recovery time any time he has to transfer himself to or from his wheelchair. He has only used a slide board a few times and would need additional training with PT for safety due to very high risk of falls. It is also notable that his wheelchair is broken and the back panel is nearly detached, further increasing this pt's risk for falls. Recommend replacement of wheelchair prior to returning home.

## 2024-01-03 NOTE — Progress Notes (Signed)
Notified Dr. Dartha Lodge of following: Patient called at 1625 to say he needs a breathing treatment. RNs administered PRN breathing treatment, but patient refused pulse-ox or to have a stethoscope placed on his chest. At 1730, patient began calling every few minutes for breathing treatment even after being educated that next dose can be administered at 1830. RTs and [facility] Response entered room to assess patient, but patient refused to let them in.  No further orders received.

## 2024-01-03 NOTE — Progress Notes (Addendum)
Pt calling out for breathing treatments but to early for next dose of nebs. RT called to unit by CN however pt has refused RT assessment and their care. Pt states he did not like how they entered the room and approached him.

## 2024-01-03 NOTE — Progress Notes (Signed)
PROGRESS NOTE    [name]  ZOX:[id number] DOB: [birth year] DOA: 01/02/2024 PCP: Georganna Skeans, MD  Outpatient Specialists:     Brief Narrative:  Patient is a 58 year old male with past medical history of significant for Schizoaffective disorder, heart failure with reduced ejection fraction, pretension, diabetes mellitus type 2, COPD, s/p bilateral amputations, chronic pain, and polysubstance abuse.  Patient was admitted with acute hypoxic respiratory failure, acute on chronic heart failure with reduced ejection fraction, and COPD with exacerbation.  Patient continues to wheeze.  Assessment & Plan:   Principal Problem:   Heart failure with reduced ejection fraction (HCC) Active Problems:   Bilateral lower extremity pain   Elevated troponin   Schizoaffective disorder (HCC)   Acute respiratory failure with hypoxia (HCC)   Essential hypertension   Cocaine abuse (HCC)   Mixed diabetic hyperlipidemia associated with type 2 diabetes mellitus (HCC)   Prolonged QT interval   Normocytic anemia   History of DVT (deep vein thrombosis)   COPD (chronic obstructive pulmonary disease) (HCC)   Acute respiratory failure with hypoxia Acute on chronic heart failure with reduced ejection fraction Patient presents with acute shortness of breath.  Initially placed on BiPAP while in the emergency department.   Last echocardiogram noted EF to be less than 20% with global hypokinesis and grade 2 diastolic dysfunction from last TEE from August 2023.  Patient had initially been given Lasix 60 mg IV x 1 dose. -Continuous pulse oximetry with nasal cannula oxygen maintain O2 saturations -Strict I&O's and daily weights -Follow-up BNP -Follow echocardiogram    Elevated troponin Acute on chronic.  Follow-up echocardiogram   Prolonged QT interval QTc was noted to be 514. -Avoid QT prolonging medication when possible. -Potassium level is 5. -Will check magnesium level.     COPD, without  exacerbation -Patient continues to wheeze. -Add nebs Pulmicort. -Continue nebs DuoNeb. -Continue to monitor prolonged QTc interval closely.     Essential hypertension -Continue to optimize. -Start nifedipine 60 Mg p.o. once daily. -Hydralazine 25 Mg p.o. 4 times daily as needed for systolic blood pressure greater than 170 mmHg. -Will hold ACE inhibitor/ARB as patient has high normal potassium level. -Torsemide 20 Mg p.o. once daily. -Goal blood pressure should be less than 130/80 mmHg.   Diabetes mellitus type 2 with hyperglycemia, not on long-term insulin Mixed hyperlipidemia -Admission glucose noted to be 176.  During last hospitalization patient declined any blood sugar checks.  Once available hemoglobin A1c was noted to be 11.2 when checked back on 12/03/2022. -Hypoglycemic protocols -A1c of 5.8%.   -Continue to monitor and optimize.     Schizoaffective disorder Patient had been started on low-dose Abilify during last hospitalization. -Resume Abilify   Normocytic anemia Hemoglobin 12.9 which appears similar to prior. -Continue to monitor   Left below-knee amputation  Right above-knee amputation Chronic bilateral lower extremity pain -Continue gabapentin, baclofen, and oxycodone     History of DVT/thromboembolism Patient had previously declined anticoagulation. -Continue to engage patient on the need of anticoagulation given history   Transaminitis Possibly secondary to patient being acutely fluid overloaded.   Polysubstance abuse -History of cocaine abuse. -Urine drug screen is positive for cocaine.   DVT prophylaxis: Subcutaneous Lovenox Code Status: Full code. Family Communication:  Disposition Plan: Patient remains inpatient.  Multiple and severe comorbidities.   Consultants:  None.  Procedures:  None.  Antimicrobials:  None.   Subjective: -Patient continues to report shortness of breath. -Wheezing.  Objective: Vitals:   01/03/24 8119 01/03/24  1478  01/03/24 0914 01/03/24 0921  BP: (!) 145/112 (!) 160/126 (!) 160/126 (!) 160/126  Pulse:  78 81 80  Resp:   16 16  Temp:   98.9 F (37.2 C)   TempSrc:   Oral   SpO2:   100% 100%  Weight:        Intake/Output Summary (Last 24 hours) at 01/03/2024 0940 Last data filed at 01/02/2024 1808 Gross per 24 hour  Intake 240 ml  Output 2300 ml  Net -2060 ml   Filed Weights   01/02/24 1231  Weight: 86.2 kg    Examination:  General exam: Patient is not in any obvious acute respiratory distress.   Respiratory system: Decreased air entry with expiratory wheeze.   Cardiovascular system: S1 & S2  Gastrointestinal system: Abdomen is soft and nontender.   Central nervous system: Alert and oriented. . Extremities: Amputation of bilateral lower extremities.  Data Reviewed: I have personally reviewed following labs and imaging studies  CBC: Recent Labs  Lab 01/02/24 1232 01/02/24 2315 01/03/24 0538  WBC 6.5  --  6.3  NEUTROABS 4.0  --   --   HGB 12.9* 13.3 13.3  HCT 39.7 39.0 40.5  MCV 99.7  --  97.8  PLT 263  --  278   Basic Metabolic Panel: Recent Labs  Lab 01/02/24 1232 01/02/24 2315 01/03/24 0538  NA 137 138 133*  K 4.6 4.9 5.7*  CL 103  --  102  CO2 21*  --  20*  GLUCOSE 176*  --  151*  BUN 19  --  28*  CREATININE 1.22  --  1.24  CALCIUM 8.8*  --  8.9   GFR: Estimated Creatinine Clearance: 73.2 mL/min (by C-G formula based on SCr of 1.24 mg/dL). Liver Function Tests: Recent Labs  Lab 01/02/24 1232 01/03/24 0538  AST 70* 31  ALT 56* 48*  ALKPHOS 111 108  BILITOT 2.0* 1.5*  PROT 6.5 6.7  ALBUMIN 3.3* 3.3*   No results for input(s): "LIPASE", "AMYLASE" in the last 168 hours. No results for input(s): "AMMONIA" in the last 168 hours. Coagulation Profile: No results for input(s): "INR", "PROTIME" in the last 168 hours. Cardiac Enzymes: No results for input(s): "CKTOTAL", "CKMB", "CKMBINDEX", "TROPONINI" in the last 168 hours. BNP (last 3 results) No  results for input(s): "PROBNP" in the last 8760 hours. HbA1C: Recent Labs    01/02/24 1232  HGBA1C 5.8*   CBG: No results for input(s): "GLUCAP" in the last 168 hours. Lipid Profile: No results for input(s): "CHOL", "HDL", "LDLCALC", "TRIG", "CHOLHDL", "LDLDIRECT" in the last 72 hours. Thyroid Function Tests: No results for input(s): "TSH", "T4TOTAL", "FREET4", "T3FREE", "THYROIDAB" in the last 72 hours. Anemia Panel: No results for input(s): "VITAMINB12", "FOLATE", "FERRITIN", "TIBC", "IRON", "RETICCTPCT" in the last 72 hours. Urine analysis:    Component Value Date/Time   COLORURINE YELLOW 01/02/2024 1454   APPEARANCEUR CLEAR 01/02/2024 1454   LABSPEC >1.030 (H) 01/02/2024 1454   PHURINE 5.5 01/02/2024 1454   GLUCOSEU NEGATIVE 01/02/2024 1454   HGBUR NEGATIVE 01/02/2024 1454   BILIRUBINUR SMALL (A) 01/02/2024 1454   KETONESUR NEGATIVE 01/02/2024 1454   PROTEINUR 100 (A) 01/02/2024 1454   NITRITE NEGATIVE 01/02/2024 1454   LEUKOCYTESUR NEGATIVE 01/02/2024 1454   Sepsis Labs: @LABRCNTIP (procalcitonin:4,lacticidven:4)  ) Recent Results (from the past 240 hours)  Resp panel by RT-PCR (RSV, Flu A&B, Covid) Anterior Nasal Swab     Status: None   Collection Time: 01/02/24 12:33 PM   Specimen: Anterior Nasal Swab  Result Value Ref Range Status   SARS Coronavirus 2 by RT PCR NEGATIVE NEGATIVE Final   Influenza A by PCR NEGATIVE NEGATIVE Final   Influenza B by PCR NEGATIVE NEGATIVE Final    Comment: (NOTE) The Xpert Xpress SARS-CoV-2/FLU/RSV plus assay is intended as an aid in the diagnosis of influenza from Nasopharyngeal swab specimens and should not be used as a sole basis for treatment. Nasal washings and aspirates are unacceptable for Xpert Xpress SARS-CoV-2/FLU/RSV testing.  Fact Sheet for Patients: BloggerCourse.com  Fact Sheet for Healthcare Providers: SeriousBroker.it  This test is not yet approved or cleared by  the [place] FDA and has been authorized for detection and/or diagnosis of SARS-CoV-2 by FDA under an Emergency Use Authorization (EUA). This EUA will remain in effect (meaning this test can be used) for the duration of the COVID-19 declaration under Section 564(b)(1) of the Act, 21 U.S.C. section 360bbb-3(b)(1), unless the authorization is terminated or revoked.     Resp Syncytial Virus by PCR NEGATIVE NEGATIVE Final    Comment: (NOTE) Fact Sheet for Patients: BloggerCourse.com  Fact Sheet for Healthcare Providers: SeriousBroker.it  This test is not yet approved or cleared by the [place] FDA and has been authorized for detection and/or diagnosis of SARS-CoV-2 by FDA under an Emergency Use Authorization (EUA). This EUA will remain in effect (meaning this test can be used) for the duration of the COVID-19 declaration under Section 564(b)(1) of the Act, 21 U.S.C. section 360bbb-3(b)(1), unless the authorization is terminated or revoked.  Performed at [facility], [address], [place], [name] [address]          Radiology Studies: DG CHEST PORT 1 VIEW Result Date: 01/02/2024 CLINICAL DATA:  Shortness of breath. EXAM: PORTABLE CHEST 1 VIEW COMPARISON:  January 02, 2024 (12:57 p.m.) FINDINGS: There is stable mild to moderate severity cardiac silhouette enlargement. There is no evidence of an acute infiltrate, pleural effusion or pneumothorax. The visualized skeletal structures are unremarkable. IMPRESSION: Stable cardiomegaly without evidence of acute or active cardiopulmonary disease. Electronically Signed   By: Aram Candela M.D.   On: 01/02/2024 22:38   DG Chest Portable 1 View Result Date: 01/02/2024 CLINICAL DATA:  Shortness of breath. EXAM: PORTABLE CHEST 1 VIEW COMPARISON:  December 20, 2023. FINDINGS: Stable cardiomegaly. Both lungs are clear. The visualized skeletal structures are unremarkable.  IMPRESSION: No active disease. Electronically Signed   By: Lupita Raider M.D.   On: 01/02/2024 13:13        Scheduled Meds:  ARIPiprazole  5 mg Oral Daily   carvedilol  12.5 mg Oral BID WC   enoxaparin (LOVENOX) injection  40 mg Subcutaneous Q24H   guaiFENesin  600 mg Oral BID   ipratropium-albuterol  3 mL Nebulization Q6H   methylPREDNISolone (SOLU-MEDROL) injection  40 mg Intravenous Q12H   oxyCODONE  20 mg Oral Q12H   pantoprazole  40 mg Oral Daily   sodium chloride flush  3 mL Intravenous Q12H   Continuous Infusions:   LOS: 1 day    Time spent: 35 minutes.    Berton Mount, MD  [facility] Pager #: [phone] 7PM-7AM contact night coverage as above

## 2024-01-03 NOTE — Progress Notes (Signed)
Pt called RN for breathing treatment, nedulizer given. Pt will not allow RN to assess lungs. Pt refuses to wear pulse ox.

## 2024-01-03 NOTE — Progress Notes (Addendum)
Notified Dr. Dartha Lodge that patient is refusing assessments and admission process including CHG bath and gown change, skin assessment, pulse ox, head to toe assessment, etc. Patient is talking aggressively to nursing staff and yelling. Also notified that patient is requesting consult with Dr. Lajoyce Corners from Orthopedics, but will not tell nurses why he is requesting this or what his questions are about ortho needs. Also notified that patient is demanding more pain medications than are ordered, but refuses to tell nurses what he takes at home and will only say he goes to a pain clinic. No further orders received.

## 2024-01-03 NOTE — Evaluation (Signed)
Physical Therapy Evaluation Patient Details Name: [name] MRN: [id number] DOB: 03/03/1966 Today's Date: 01/03/2024  History of Present Illness  Pt is a 58 y.o. male presenting 01/02/24 with shortness of breath for 1 week. CXR stable cardiomegaly with no active disease. Recent hospitalization 1/19-1/21 for acute on chronic heart failure exacerbation. PMH significant of  Schizoaffective disorder, heart failure with reduced ejection fraction, pretension, DMII, COPD, s/p bilateral amputations, chronic pain, and polysubstance abuse  Clinical Impression  Pt was admitted on 01/02/24 with conditions above and deficits below, see PT Problem List. Pt declined to cooperate to gain information regarding PLOF and home info. Per Child psychotherapist note, PTA pt was living alone in an apartment, presumably mod I with wheelchair mobility. During today's session, pt refused any therapist related functional mobility, expect voluntary bed mobility to reposition and use urinal. Pt could independently roll to either side, prop himself up on his R elbow in sidelying, and sit in long sitting. Depending on pt's independence with transfers he may benefit from short term inpatient rehab < 3 hours per day. However if he can transfer independently to and from his wheelchair, he may be able to discharge home with HHPT. Will continue to follow acutely.        If plan is discharge home, recommend the following: A little help with bathing/dressing/bathroom;Assistance with cooking/housework;Help with stairs or ramp for entrance;Assist for transportation   Can travel by private vehicle   No    Equipment Recommendations Wheelchair (measurements PT);Other (comment) (Social work reports pt's w/c is broken; Fish farm manager)  Recommendations for [employer]       Functional Status Assessment Patient has had a recent decline in their functional status and demonstrates the ability to make significant improvements in function in a  reasonable and predictable amount of time.     Precautions / Restrictions Precautions Precautions: Fall Precaution Comments: L BKA; R knee disarticulation Restrictions Weight Bearing Restrictions Per Provider Order: No      Mobility  Bed Mobility Overal bed mobility: Independent             General bed mobility comments: Pt declined out of bed mobility but independently repositioned himself in long sitting, rolled to his R and L sides, and prop himself up in L sidelying.    Transfers                   General transfer comment: Pt declined out of bed mobility    Ambulation/Gait               General Gait Details: Not assessed at this time  Administrator mobility:  (Not assessed at this time)   Tilt Bed    Modified Rankin (Stroke Patients Only)       Balance Overall balance assessment: Mild deficits observed, not formally tested                                           Pertinent Vitals/Pain Pain Assessment Pain Assessment: Faces Faces Pain Scale: No hurt Pain Intervention(s): Monitored during session    Home Living Family/patient expects to be discharged to:: Private residence Living Arrangements: Alone   Type of Home: Apartment Home Access: Other (comment) (unable to assess at this point)       Home Layout:  (  unable to assess at this point) Home Equipment:  (unable to assess at this point) Additional Comments: Information aboce received from social worker's note. Pt today did not allow therapists to ask questions    Prior Function Prior Level of Function :  (Pt refused to answer questions. Assumed pt was mod I with wheelchair mobility)                     Extremity/Trunk Assessment   Upper Extremity Assessment Upper Extremity Assessment: Defer to OT evaluation    Lower Extremity Assessment Lower Extremity Assessment: RLE deficits/detail;LLE  deficits/detail RLE Deficits / Details: R knee disarticulation. Unable to formally assess d/t pt agitation LLE Deficits / Details: L BKA; Unable to formally assess d/t pt agitation    Cervical / Trunk Assessment Cervical / Trunk Assessment: Kyphotic  Communication   Communication Communication: Difficulty communicating thoughts/reduced clarity of speech  Cognition Arousal: Alert Behavior During Therapy: Agitated Overall Cognitive Status: History of cognitive impairments - at baseline                                          General Comments General comments (skin integrity, edema, etc.): Pt was agitated and refused to work with therapists. Pt yelled that "all he wanted to do was to use the urinal" and would not work with rehab today except to demonstrate the bed mobility listed above to reposition and use the urinal. When asked when he would agree to work with rehab, the pt responded "tomorrow" but would not give an optimal time.    Exercises     Assessment/Plan    PT Assessment Patient needs continued PT services  PT Problem List Decreased activity tolerance;Decreased mobility;Decreased strength       PT Treatment Interventions DME instruction;Gait training;Functional mobility training;Therapeutic activities;Therapeutic exercise;Balance training;Neuromuscular re-education;Cognitive remediation;Patient/family education;Wheelchair mobility training    PT Goals (Current goals can be found in the Care Plan section)  Acute Rehab PT Goals Patient Stated Goal: pt refused to discuss PT Goal Formulation: Patient unable to participate in goal setting Time For Goal Achievement: 01/17/24 Potential to Achieve Goals: Fair    Frequency Min 1X/week     Co-evaluation PT/OT/SLP Co-Evaluation/Treatment: Yes Reason for Co-Treatment: Necessary to address cognition/behavior during functional activity;Other (comment) (pt agitated) PT goals addressed during session:  Mobility/safety with mobility;Balance         AM-PAC PT "6 Clicks" Mobility  Outcome Measure Help needed turning from your back to your side while in a flat bed without using bedrails?: None Help needed moving from lying on your back to sitting on the side of a flat bed without using bedrails?: None Help needed moving to and from a bed to a chair (including a wheelchair)?: Total Help needed standing up from a chair using your arms (e.g., wheelchair or bedside chair)?: Total Help needed to walk in hospital room?: Total Help needed climbing 3-5 steps with a railing? : Total 6 Click Score: 12    End of Session   Activity Tolerance: Treatment limited secondary to agitation Patient left: in bed;with nursing/sitter in room Nurse Communication: Other (comment) (RN present and aware of agitation) PT Visit Diagnosis: Muscle weakness (generalized) (M62.81)    Time: 4098-1191 PT Time Calculation (min) (ACUTE ONLY): 8 min   Charges:   PT Evaluation $PT Eval Moderate Complexity: 1 Mod   PT General Charges $$ ACUTE PT  VISIT: 1 Visit         Nechama Guard, SPT   [place] 01/03/2024, 6:28 PM

## 2024-01-03 NOTE — TOC Initial Note (Addendum)
Transition of Care [facility]) - Initial/Assessment Note    Patient Details  Name: [name] MRN: [id number] Date of Birth: 1966/01/13  Transition of Care [facility]) CM/SW Contact:    [name], [name] Phone Number: 01/03/2024, 10:10 AM  Clinical Narrative:                 CSW spoke with patient at bedside due to being familiar with patient from [facility]. Patient states he is really sick right now and is having trouble breathing. Patient states he does not like the bipap because it makes his nose dry, but agreeable to wear bipap at home when discharged - CSW emphasized importance of use of equipment at home and patient stated understanding. Patient reports he is still receiving services from [facility] for PCS. Patient reports he is still being followed by Lorayne Marek at [facility] and sees Onalee Hua from [name] frequently. Patient reports he makes an effort to be medication compliant at home but often struggles with managing his prescriptions. 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 Patient confirms he is still actively drinking alcohol, smoking tobacco, and using crack cocaine. CSW counseled patient to cease all use of illicit substances as they are detrimental to his health - patient agreeable to continue working with Peyton Najjar at [name] to work on residential substance abuse treatment. CSW explained to patient that unit TOC will assist him with his needs as best as possible - he stated understanding and agreement. Patient has this CSW work cell phone number to call if needs arise prior to being moved to an inpatient bed.  Patient in need of a new wheelchair as his current one is damaged - the back is almost completely torn off. Patient requesting other DME as well including home oxygen, home bipap, nebulizer machine, nebulizer meds, and a slide board.  CSW sent secure email to [place] at [facility] notifying her of patient's presence at the hospital. CSW unable to  reach staff at [facility] due to it being the weekend.  Expected Discharge Plan: Home/Self Care Barriers to Discharge: Continued Medical Work up   Patient Goals and CMS Choice            Expected Discharge Plan and Services     Post Acute Care Choice: Durable Medical Equipment (PCS with [facility]) Living arrangements for the past 2 months: Apartment                                      Prior Living Arrangements/Services Living arrangements for the past 2 months: Apartment Lives with:: Self Patient language and need for interpreter reviewed:: Yes Do you feel safe going back to the place where you live?: Yes      Need for Family Participation in Patient Care: No (Comment) Care giver support system in place?: Yes (comment) Current home services: DME Criminal Activity/Legal Involvement Pertinent to Current Situation/Hospitalization: No - Comment as needed  Activities of Daily Living      Permission Sought/Granted Permission sought to share information with : Other (comment) (Rochelle at [facility] and Onalee Hua at [facility]) Permission granted to share information with : Yes, Verbal Permission Granted              Emotional Assessment Appearance:: Appears stated age Attitude/Demeanor/Rapport: Ambitious, Engaged, Charismatic, Gracious Affect (typically observed): Adaptable, Accepting, Pleasant Orientation: : Oriented to Self, Oriented to Place,  Oriented to  Time, Oriented to Situation Alcohol / Substance Use: Tobacco Use Psych Involvement: Yes (comment)  Admission diagnosis:  Heart failure with reduced ejection fraction (HCC) [I50.20] Patient Active Problem List   Diagnosis Date Noted   Heart failure with reduced ejection fraction (HCC) 01/02/2024   Acute on chronic HFrEF (heart failure with reduced ejection fraction) (HCC) 12/20/2023   Cocaine use 10/23/2023   COPD (chronic obstructive pulmonary disease) (HCC) 11/25/2022   Pulmonary emboli (HCC)  08/17/2022   Venous ulcer (HCC) 07/23/2022   Sinus bradycardia 07/23/2022   Anemia of chronic disease 07/23/2022   S/P AKA (above knee amputation), right (HCC) 07/23/2022   Acute on chronic systolic congestive heart failure (HCC) 07/12/2022   History of DVT (deep vein thrombosis) 07/12/2022   CAP (community acquired pneumonia) 07/12/2022   Chronic venous hypertension (idiopathic) with ulcer and inflammation of bilateral lower extremity (HCC)    Normocytic anemia 04/04/2022   Acute deep vein thrombosis (DVT) of brachial vein (HCC) 03/31/2022   Swelling of lower extremity 03/23/2022   SIRS (systemic inflammatory response syndrome) (HCC) 03/23/2022   Leg swelling 03/23/2022   Acute on chronic systolic heart failure (HCC) 02/26/2022   Seizure (HCC) 02/26/2022   Venous stasis ulcer of right ankle limited to breakdown of skin with varicose veins (HCC)    Hypertensive urgency 01/29/2022   Prolonged QT interval 01/29/2022   Alcohol abuse 01/29/2022   chronic ulcers 01/29/2022   Wound infection 12/04/2021   Mixed diabetic hyperlipidemia associated with type 2 diabetes mellitus (HCC) 12/03/2021   Cellulitis of right lower extremity 11/13/2021   Type 2 diabetes mellitus (HCC) 11/13/2021   Antisocial personality disorder (HCC) 11/04/2021   Malingering 11/04/2021   Foot pain 04/23/2021   Chronic combined systolic and diastolic congestive heart failure (HCC) 03/02/2021   History of stroke 03/02/2021   Polysubstance abuse (HCC) 03/02/2021   Skin ulcer of right great toe with necrosis of bone (HCC)    Encounter for assessment of healthcare decision-making capacity    Pain, dental    Dystonia    Dysarthria    Shock liver 01/04/2021   Acute metabolic encephalopathy 01/04/2021   Coronary artery disease involving native coronary artery of native heart without angina pectoris 01/04/2021   Essential hypertension 01/04/2021   Bipolar I disorder (HCC) 01/04/2021   Cocaine abuse (HCC) 01/04/2021    Homelessness 01/04/2021   HLD (hyperlipidemia) 01/04/2021   Osteomyelitis of great toe (HCC)    Ulcer of great toe, left, with necrosis of bone (HCC)    Ulcer of great toe, right, with necrosis of muscle (HCC)    Acute osteomyelitis of toe, left (HCC)    Acute on chronic combined systolic and diastolic CHF (congestive heart failure) (HCC) 12/20/2020   Acute respiratory failure with hypoxia (HCC)    Ischemic stroke (HCC) 12/17/2020   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   Diabetic foot ulcer (HCC) 12/08/2020   CHF (congestive heart failure) (HCC) 11/14/2020   CHF exacerbation (HCC) 11/13/2020   Hypokalemia    Hyperlipidemia    Acute hypoxemic respiratory failure (HCC) 10/26/2020   COPD with acute exacerbation (HCC) 10/14/2020   Acute on chronic combined systolic and diastolic CHF (congestive heart failure) (HCC) 10/02/2020   Heart failure with reduced ejection fraction and diastolic dysfunction (HCC) 09/07/2020   Near syncope 07/21/2020   Acid reflux 06/03/2020   Cannabis abuse 03/19/2020   Acute bronchitis due to Rhinovirus 09/08/2019   Schizoaffective disorder (HCC) 06/24/2019   SOB (shortness of breath)  Cocaine abuse (HCC)    Elevated troponin    Homeless    Suicidal ideation    Bilateral lower extremity pain 04/05/2019   Housing situation unstable 03/28/2019   Bilateral lower extremity edema    Acute exacerbation of CHF (congestive heart failure) (HCC) 03/04/2019   Alcohol withdrawal syndrome, with delirium (HCC) 05/03/2018   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   Mild intermittent asthma without complication 08/28/2017   Asthma 12/26/2016   Tobacco use disorder 12/26/2016   Schizophrenia (HCC) 12/26/2016   Severe episode of recurrent major depressive disorder, with psychotic features (HCC) 10/31/2016   Cocaine abuse with cocaine-induced mood disorder (HCC) 09/06/2016   Alcohol use disorder, mild, abuse 07/31/2016   Substance induced mood  disorder (HCC) 07/30/2016   Schizoaffective disorder, bipolar type (HCC) 05/31/2016   Cocaine use disorder, severe, dependence (HCC) 05/31/2016   Major depressive disorder, recurrent episode (HCC) 05/30/2016   Suicidal behavior with attempted self-injury (HCC) 05/08/2016   Acetaminophen overdose 05/06/2016   PCP:  Georganna Skeans, MD Pharmacy:   [id number] - [place], [place] - [address] [place]  [address] Phone: [phone] Fax: [phone]  [facility] - [place], [name] - [address] [facility] [address] [name] [address] Phone: [phone] Fax: [phone]     Social Drivers of Health (SDOH) Social History: SDOH Screenings   Food Insecurity: Patient Declined (12/20/2023)  Housing: Patient Declined (12/20/2023)  Transportation Needs: Patient Declined (12/20/2023)  Utilities: Patient Declined (12/20/2023)  Alcohol Screen: Medium Risk (06/24/2019)  Depression (PHQ2-9): Medium Risk (07/28/2023)  Financial Resource Strain: High Risk (07/24/2021)   Received from [facility], [facility]  Social Connections: Unknown (04/05/2022)   [facility] from [name], [facility]  Stress: Stress Concern Present (07/24/2021)   Received from [facility], [facility]  Tobacco Use: High Risk (01/03/2024)   SDOH Interventions:     Readmission Risk Interventions    12/21/2023    9:25 AM 12/09/2022    9:02 AM 12/01/2022    3:56 PM  Readmission Risk Prevention Plan  Transportation Screening Complete Complete Complete  HRI or Home Care Consult Complete    Social Work Consult for Recovery Care Planning/Counseling Complete    Palliative Care Screening Not Applicable    Medication Review Oceanographer) Complete Complete Complete  PCP or Specialist appointment within 3-5 days of discharge  Complete Complete  HRI or Home Care Consult  Complete Complete  SW Recovery Care/Counseling Consult  Complete Complete  Palliative Care  Screening  Not Applicable Not Applicable  Skilled Nursing Facility  Not Applicable Not Applicable  SNF Comments   n/a

## 2024-01-03 NOTE — Progress Notes (Signed)
RT arrived bedside after a call from RN to assess patient. RT and rapid response arrived, introduced ourselves to patient and asked patient how we can help. Patient noted to not be on pulse ox. Patient states he "can't breathe". RT explained that RT was just going to listen to lungs. Patient immediately started yelling at RT and rapid to get out of the room and leave him alone. RT made RN and charge aware. RN states patient has refused pulse ox.

## 2024-01-04 ENCOUNTER — Other Ambulatory Visit: Payer: Self-pay | Admitting: Family Medicine

## 2024-01-04 ENCOUNTER — Inpatient Hospital Stay (HOSPITAL_COMMUNITY): Payer: MEDICAID

## 2024-01-04 DIAGNOSIS — I5021 Acute systolic (congestive) heart failure: Secondary | ICD-10-CM | POA: Diagnosis not present

## 2024-01-04 DIAGNOSIS — I5020 Unspecified systolic (congestive) heart failure: Secondary | ICD-10-CM | POA: Diagnosis not present

## 2024-01-04 LAB — RENAL FUNCTION PANEL
Albumin: 3.9 g/dL (ref 3.5–5.0)
Albumin: 3.4 g/dL — ABNORMAL LOW (ref 3.5–5.0)
Anion gap: 15 (ref 5–15)
Anion gap: 15 (ref 5–15)
BUN: 35 mg/dL — ABNORMAL HIGH (ref 6–20)
BUN: 36 mg/dL — ABNORMAL HIGH (ref 6–20)
CO2: 29 mmol/L (ref 22–32)
CO2: 27 mmol/L (ref 22–32)
Calcium: 9.5 mg/dL (ref 8.9–10.3)
Calcium: 9.3 mg/dL (ref 8.9–10.3)
Chloride: 96 mmol/L — ABNORMAL LOW (ref 98–111)
Chloride: 99 mmol/L (ref 98–111)
Creatinine, Ser: 1.35 mg/dL — ABNORMAL HIGH (ref 0.61–1.24)
Creatinine, Ser: 1.49 mg/dL — ABNORMAL HIGH (ref 0.61–1.24)
GFR, Estimated: >60 mL/min (ref >60)
GFR, Estimated: 55 mL/min — ABNORMAL LOW (ref >60)
Glucose, Bld: 124 mg/dL — ABNORMAL HIGH (ref 70–99)
Glucose, Bld: 112 mg/dL — ABNORMAL HIGH (ref 70–99)
Phosphorus: 3.8 mg/dL (ref 2.5–4.6)
Phosphorus: 4.1 mg/dL (ref 2.5–4.6)
Potassium: 3.8 mmol/L (ref 3.5–5.1)
Potassium: 4.2 mmol/L (ref 3.5–5.1)
Sodium: 140 mmol/L (ref 135–145)
Sodium: 141 mmol/L (ref 135–145)

## 2024-01-04 LAB — CBC WITH DIFFERENTIAL/PLATELET
Abs Immature Granulocytes: 0.06 10*3/uL (ref 0.00–0.07)
Basophils Absolute: 0 10*3/uL (ref 0.0–0.1)
Basophils Relative: 0 %
Eosinophils Absolute: 0 10*3/uL (ref 0.0–0.5)
Eosinophils Relative: 0 %
HCT: 43.5 % (ref 39.0–52.0)
Hemoglobin: 14.2 g/dL (ref 13.0–17.0)
Immature Granulocytes: 1 %
Lymphocytes Relative: 6 %
Lymphs Abs: 0.8 10*3/uL (ref 0.7–4.0)
MCH: 31.8 pg (ref 26.0–34.0)
MCHC: 32.6 g/dL (ref 30.0–36.0)
MCV: 97.3 fL (ref 80.0–100.0)
Monocytes Absolute: 0.4 10*3/uL (ref 0.1–1.0)
Monocytes Relative: 3 %
Neutro Abs: 12 10*3/uL — ABNORMAL HIGH (ref 1.7–7.7)
Neutrophils Relative %: 90 %
Platelets: 331 10*3/uL (ref 150–400)
RBC: 4.47 MIL/uL (ref 4.22–5.81)
RDW: 15.5 % (ref 11.5–15.5)
WBC: 13.3 10*3/uL — ABNORMAL HIGH (ref 4.0–10.5)
nRBC: 0 % (ref 0.0–0.2)

## 2024-01-04 LAB — ECHOCARDIOGRAM COMPLETE
Est EF: <20
S' Lateral: 5.4 cm
Weight: 3040 [oz_av]

## 2024-01-04 LAB — MAGNESIUM: Magnesium: 1.9 mg/dL (ref 1.7–2.4)

## 2024-01-04 MED ORDER — MAGNESIUM SULFATE IN D5W 1-5 GM/100ML-% IV SOLN
1.0000 g | Freq: Once | INTRAVENOUS | Status: DC
  Filled 2024-01-04: qty 100

## 2024-01-04 MED ORDER — ISOSORB DINITRATE-HYDRALAZINE 20-37.5 MG PO TABS
1.0000 | ORAL_TABLET | Freq: Three times a day (TID) | ORAL | Status: DC
  Administered 2024-01-04 – 2024-01-06 (×6): 1 via ORAL
  Filled 2024-01-04 (×6): qty 1

## 2024-01-04 NOTE — Progress Notes (Signed)
  Echocardiogram 2D Echocardiogram has been performed.  [name] [name] 01/04/2024, 8:29 AM

## 2024-01-04 NOTE — Progress Notes (Signed)
Patient requested pain medication for 9/10 left leg pain. Patient was able to state pain level but continuously fell asleep while speaking to this RN. Easily awakened and alert to self but refusal to cooperate, stated,  "I want to go back to sleep". Medication held, vitals taken and charted.

## 2024-01-04 NOTE — Progress Notes (Signed)
Mobility Specialist Progress Note:   01/04/24 1002  Mobility  Activity  (transferred to w/c. self-propelled down hallway)  Level of Assistance Contact guard assist, steadying assist (hold w/c during transfer)  Assistive Device Wheelchair  Activity Response Tolerated well  Mobility Referral Yes  Mobility visit 1 Mobility  Mobility Specialist Start Time (ACUTE ONLY) 1002  Mobility Specialist Stop Time (ACUTE ONLY) 1035  Mobility Specialist Time Calculation (min) (ACUTE ONLY) 33 min   Pt agreeable to mobility session after extensive "catching up". Required no physical assistance, however did required the w/c to be held in place while transferring to and from. Pt's w/c in bad shape, however pt has adapted. Pt with wheezing/crackling sounds throughout session, SpO2 97%. Pt transferred back to bed, left with all needs met.   [name] Mobility Specialist Please contact via SecureChat or  Rehab office at [phone]

## 2024-01-04 NOTE — Care Management (Cosign Needed)
    Durable Medical Equipment  (From admission, onward)           Start     Ordered   01/04/24 1621  For home use only DME Other see comment  Once       Comments: Slide Board  Question:  Length of Need  Answer:  Lifetime   01/04/24 1624   01/04/24 1621  For home use only DME oxygen  Once       Question Answer Comment  Length of Need 6 Months   Liters per Minute 3   Frequency Continuous (stationary and portable oxygen unit needed)   Oxygen delivery system Gas      01/04/24 1624   01/04/24 1620  For home use only DME Nebulizer machine  Once       Question Answer Comment  Patient needs a nebulizer to treat with the following condition Shortness of breath   Length of Need Lifetime   Additional equipment included Administration kit   Additional equipment included Filter      01/04/24 1624   01/04/24 1618  For home use only DME standard manual wheelchair with seat cushion  Once       Comments: Patient suffers from Bilateral lower extremity amputations which impairs their ability to perform daily activities like bathing, dressing, feeding, grooming, and toileting in the home.  A cane, crutch, or walker will not resolve issue with performing activities of daily living. A wheelchair will allow patient to safely perform daily activities. Patient can safely propel the wheelchair in the home or has a caregiver who can provide assistance. Length of need Lifetime. Accessories: elevating leg rests (ELRs), wheel locks, extensions and anti-tippers.   01/04/24 1624

## 2024-01-04 NOTE — Progress Notes (Signed)
Patient removed existing IV, and refusing re-placement. Encouraged patient, and discussed necessity for med administration. Will continue to discuss and place IV once agreeable.

## 2024-01-04 NOTE — Progress Notes (Signed)
PROGRESS NOTE    [name]  WJX:[id number] DOB: 04/07/1966 DOA: 01/02/2024 PCP: Georganna Skeans, MD  Outpatient Specialists:     Brief Narrative:  Patient is a 58 year old male with past medical history of significant for Schizoaffective disorder, heart failure with reduced ejection fraction, pretension, diabetes mellitus type 2, COPD, s/p bilateral amputations, chronic pain, and polysubstance abuse.  Patient was admitted with acute hypoxic respiratory failure, acute on chronic heart failure with reduced ejection fraction, and COPD with exacerbation.  Patient continues to wheeze.  01/04/2024: Patient seen.  Patient looks a lot better today.  However, patient continues to wheeze.  Repeat EKG done today revealed QTc of 514 ms.  Continue treatment for COPD with exacerbation.  Assessment & Plan:   Principal Problem:   Heart failure with reduced ejection fraction (HCC) Active Problems:   Bilateral lower extremity pain   Elevated troponin   Schizoaffective disorder (HCC)   Acute respiratory failure with hypoxia (HCC)   Essential hypertension   Cocaine abuse (HCC)   Mixed diabetic hyperlipidemia associated with type 2 diabetes mellitus (HCC)   Prolonged QT interval   Normocytic anemia   History of DVT (deep vein thrombosis)   COPD (chronic obstructive pulmonary disease) (HCC)   Acute respiratory failure with hypoxia Acute on chronic heart failure with reduced ejection fraction Patient presents with acute shortness of breath.  Initially placed on BiPAP while in the emergency department.   Last echocardiogram noted EF to be less than 20% with global hypokinesis and grade 2 diastolic dysfunction from last TEE from August 2023.  Patient had initially been given Lasix 60 mg IV x 1 dose. -Continuous pulse oximetry with nasal cannula oxygen maintain O2 saturations -Strict I&O's and daily weights -Patient is currently on torsemide 20 Mg p.o. once daily. -Start patient on BiDil. -Continue to  hold ACE inhibitor/ARB due to high normal potassium  Echocardiogram done today, 01/04/2024 revealed:  1. Left ventricular ejection fraction, by estimation, is <20%. The left  ventricle has severely decreased function. The left ventricle demonstrates  global hypokinesis. The left ventricular internal cavity size was  moderately to severely dilated. There is   mild concentric left ventricular hypertrophy. Left ventricular diastolic  parameters are indeterminate.   2. Right ventricular systolic function was not well visualized. The right  ventricular size is mildly enlarged. There is moderately elevated  pulmonary artery systolic pressure. The estimated right ventricular  systolic pressure is 49.5 mmHg.   3. The mitral valve is abnormal. Trivial mitral valve regurgitation. No  evidence of mitral stenosis.   4. The aortic valve is tricuspid. Aortic valve regurgitation is not  visualized. No aortic stenosis is present.   Comparison(s): No significant change from prior study. No significant  change from prior. Patient refused apical, subcostal, and suprasternal  images.     Elevated troponin Acute on chronic.  Flat troponin.  476> 424.   Prolonged QT interval QTc was noted to be 514. -Avoid QT prolonging medication when possible. -Potassium level is 4.2. -Magnesium is 1.9 today.  Will give 1 g of IV magnesium.      COPD, without exacerbation -Patient continues to wheeze. -Continue nebs Pulmicort. -Continue IV Solu-Medrol -Continue nebs DuoNeb. -Continue to monitor prolonged QTc interval closely.     Essential hypertension -Continue to optimize. -Discontinue nifedipine. -Start BiDil.   -Hydralazine 25 Mg p.o. 4 times daily as needed for systolic blood pressure greater than 170 mmHg. -Will continue to hold ACE inhibitor/ARB as patient has high normal potassium level. -  Torsemide 20 Mg p.o. once daily. -Goal blood pressure should be less than 130/80 mmHg.   Diabetes mellitus type  2 with hyperglycemia, not on long-term insulin Mixed hyperlipidemia -Admission glucose noted to be 176.  During last hospitalization patient declined any blood sugar checks.  Once available hemoglobin A1c was noted to be 11.2 when checked back on 12/03/2022. -Hypoglycemic protocols -A1c of 5.8%.   -Continue to monitor and optimize.     Schizoaffective disorder Patient had been started on low-dose Abilify during last hospitalization. -Resume Abilify   Normocytic anemia Hemoglobin 12.9 which appears similar to prior. -Continue to monitor   Left below-knee amputation  Right above-knee amputation Chronic bilateral lower extremity pain -Continue gabapentin, baclofen, and oxycodone     History of DVT/thromboembolism Patient had previously declined anticoagulation. -Continue to engage patient on the need of anticoagulation given history   Transaminitis Possibly secondary to patient being acutely fluid overloaded.   Polysubstance abuse -History of cocaine abuse. -Urine drug screen was positive for cocaine.   DVT prophylaxis: Subcutaneous Lovenox Code Status: Full code. Family Communication:  Disposition Plan: Patient remains inpatient.  Multiple and severe comorbidities.   Consultants:  None.  Procedures:  None.  Antimicrobials:  None.   Subjective: -Patient continues to report shortness of breath. -Wheezing.  Objective: Vitals:   01/03/24 2350 01/04/24 0152 01/04/24 0346 01/04/24 0557  BP: (!) 134/99  95/74   Pulse: 81 71 67   Resp: 18 15 19    Temp: 98.2 F (36.8 C)     TempSrc: Oral     SpO2: 96% 95% 100%   Weight:    82.1 kg    Intake/Output Summary (Last 24 hours) at 01/04/2024 1522 Last data filed at 01/04/2024 1300 Gross per 24 hour  Intake 477 ml  Output 5625 ml  Net -5148 ml   Filed Weights   01/02/24 1231 01/04/24 0557  Weight: 86.2 kg 82.1 kg    Examination:  General exam: Patient is not in any obvious acute respiratory distress.    Respiratory system: Decreased air entry with expiratory wheeze.   Cardiovascular system: S1 & S2  Gastrointestinal system: Abdomen is soft and nontender.   Central nervous system: Alert and oriented. . Extremities: Amputation of bilateral lower extremities.  Data Reviewed: I have personally reviewed following labs and imaging studies  CBC: Recent Labs  Lab 01/02/24 1232 01/02/24 2315 01/03/24 0538 01/04/24 1251  WBC 6.5  --  6.3 13.3*  NEUTROABS 4.0  --   --  12.0*  HGB 12.9* 13.3 13.3 14.2  HCT 39.7 39.0 40.5 43.5  MCV 99.7  --  97.8 97.3  PLT 263  --  278 331   Basic Metabolic Panel: Recent Labs  Lab 01/02/24 1232 01/02/24 2315 01/03/24 0538 01/03/24 1005 01/04/24 1026 01/04/24 1251  NA 137 138 133* 135 140 141  K 4.6 4.9 5.7* 5.0 3.8 4.2  CL 103  --  102 102 96* 99  CO2 21*  --  20* 21* 29 27  GLUCOSE 176*  --  151* 148* 124* 112*  BUN 19  --  28* 31* 35* 36*  CREATININE 1.22  --  1.24 1.38* 1.35* 1.49*  CALCIUM 8.8*  --  8.9 8.9 9.5 9.3  MG  --   --   --  2.0 1.9  --   PHOS  --   --   --  3.8 3.8 4.1   GFR: Estimated Creatinine Clearance: 56 mL/min (A) (by C-G formula based on SCr  of 1.49 mg/dL (H)). Liver Function Tests: Recent Labs  Lab 01/02/24 1232 01/03/24 0538 01/03/24 1005 01/04/24 1026 01/04/24 1251  AST 70* 31  --   --   --   ALT 56* 48*  --   --   --   ALKPHOS 111 108  --   --   --   BILITOT 2.0* 1.5*  --   --   --   PROT 6.5 6.7  --   --   --   ALBUMIN 3.3* 3.3* 3.1* 3.9 3.4*   No results for input(s): "LIPASE", "AMYLASE" in the last 168 hours. No results for input(s): "AMMONIA" in the last 168 hours. Coagulation Profile: No results for input(s): "INR", "PROTIME" in the last 168 hours. Cardiac Enzymes: No results for input(s): "CKTOTAL", "CKMB", "CKMBINDEX", "TROPONINI" in the last 168 hours. BNP (last 3 results) No results for input(s): "PROBNP" in the last 8760 hours. HbA1C: Recent Labs    01/02/24 1232  HGBA1C 5.8*    CBG: No results for input(s): "GLUCAP" in the last 168 hours. Lipid Profile: No results for input(s): "CHOL", "HDL", "LDLCALC", "TRIG", "CHOLHDL", "LDLDIRECT" in the last 72 hours. Thyroid Function Tests: No results for input(s): "TSH", "T4TOTAL", "FREET4", "T3FREE", "THYROIDAB" in the last 72 hours. Anemia Panel: No results for input(s): "VITAMINB12", "FOLATE", "FERRITIN", "TIBC", "IRON", "RETICCTPCT" in the last 72 hours. Urine analysis:    Component Value Date/Time   COLORURINE YELLOW 01/02/2024 1454   APPEARANCEUR CLEAR 01/02/2024 1454   LABSPEC >1.030 (H) 01/02/2024 1454   PHURINE 5.5 01/02/2024 1454   GLUCOSEU NEGATIVE 01/02/2024 1454   HGBUR NEGATIVE 01/02/2024 1454   BILIRUBINUR SMALL (A) 01/02/2024 1454   KETONESUR NEGATIVE 01/02/2024 1454   PROTEINUR 100 (A) 01/02/2024 1454   NITRITE NEGATIVE 01/02/2024 1454   LEUKOCYTESUR NEGATIVE 01/02/2024 1454   Sepsis Labs: @LABRCNTIP (procalcitonin:4,lacticidven:4)  ) Recent Results (from the past 240 hours)  Resp panel by RT-PCR (RSV, Flu A&B, Covid) Anterior Nasal Swab     Status: None   Collection Time: 01/02/24 12:33 PM   Specimen: Anterior Nasal Swab  Result Value Ref Range Status   SARS Coronavirus 2 by RT PCR NEGATIVE NEGATIVE Final   Influenza A by PCR NEGATIVE NEGATIVE Final   Influenza B by PCR NEGATIVE NEGATIVE Final    Comment: (NOTE) The Xpert Xpress SARS-CoV-2/FLU/RSV plus assay is intended as an aid in the diagnosis of influenza from Nasopharyngeal swab specimens and should not be used as a sole basis for treatment. Nasal washings and aspirates are unacceptable for Xpert Xpress SARS-CoV-2/FLU/RSV testing.  Fact Sheet for Patients: BloggerCourse.com  Fact Sheet for Healthcare Providers: SeriousBroker.it  This test is not yet approved or cleared by the [place] FDA and has been authorized for detection and/or diagnosis of SARS-CoV-2 by FDA under an  Emergency Use Authorization (EUA). This EUA will remain in effect (meaning this test can be used) for the duration of the COVID-19 declaration under Section 564(b)(1) of the Act, 21 U.S.C. section 360bbb-3(b)(1), unless the authorization is terminated or revoked.     Resp Syncytial Virus by PCR NEGATIVE NEGATIVE Final    Comment: (NOTE) Fact Sheet for Patients: BloggerCourse.com  Fact Sheet for Healthcare Providers: SeriousBroker.it  This test is not yet approved or cleared by the [place] FDA and has been authorized for detection and/or diagnosis of SARS-CoV-2 by FDA under an Emergency Use Authorization (EUA). This EUA will remain in effect (meaning this test can be used) for the duration of the COVID-19 declaration  under Section 564(b)(1) of the Act, 21 U.S.C. section 360bbb-3(b)(1), unless the authorization is terminated or revoked.  Performed at [facility], [address], [place], [name] [address]          Radiology Studies: ECHOCARDIOGRAM COMPLETE Result Date: 01/04/2024    ECHOCARDIOGRAM REPORT   Patient Name:   [name] Date of Exam: 01/04/2024 Medical Rec #:  [id number]      Height:       69.0 in Accession #:    [id number]     Weight:       181.0 lb Date of Birth:  01-12-1966      BSA:          1.980 m Patient Age:    57 years       BP:           95/74 mmHg Patient Gender: M              HR:           65 bpm. Exam Location:  Inpatient Procedure: 2D Echo, Cardiac Doppler and Color Doppler Indications:    CHF- Acute Systolic  History:        Patient has prior history of Echocardiogram examinations, most                 recent 08/18/2022. COPD; Risk Factors:Hypertension, Diabetes,                 Dyslipidemia and Current Smoker.  Sonographer:    Karma Ganja Referring Phys: [id number] SUBRINA SUNDIL  Sonographer Comments: Image acquisition challenging due to patient behavioral factors. and Image acquisition  challenging due to respiratory motion. IMPRESSIONS  1. Left ventricular ejection fraction, by estimation, is <20%. The left ventricle has severely decreased function. The left ventricle demonstrates global hypokinesis. The left ventricular internal cavity size was moderately to severely dilated. There is  mild concentric left ventricular hypertrophy. Left ventricular diastolic parameters are indeterminate.  2. Right ventricular systolic function was not well visualized. The right ventricular size is mildly enlarged. There is moderately elevated pulmonary artery systolic pressure. The estimated right ventricular systolic pressure is 49.5 mmHg.  3. The mitral valve is abnormal. Trivial mitral valve regurgitation. No evidence of mitral stenosis.  4. The aortic valve is tricuspid. Aortic valve regurgitation is not visualized. No aortic stenosis is present. Comparison(s): No significant change from prior study. No significant change from prior. Patient refused apical, subcostal, and suprasternal images. FINDINGS  Left Ventricle: Left ventricular ejection fraction, by estimation, is <20%. The left ventricle has severely decreased function. The left ventricle demonstrates global hypokinesis. The left ventricular internal cavity size was moderately to severely dilated. There is mild concentric left ventricular hypertrophy. Left ventricular diastolic parameters are indeterminate. Right Ventricle: The right ventricular size is mildly enlarged. Right vetricular wall thickness was not well visualized. Right ventricular systolic function was not well visualized. There is moderately elevated pulmonary artery systolic pressure. The tricuspid regurgitant velocity is 3.22 m/s, and with an assumed right atrial pressure of 8 mmHg, the estimated right ventricular systolic pressure is 49.5 mmHg. Left Atrium: Left atrial size was not well visualized. Right Atrium: Right atrial size was not well visualized. Pericardium: There is no  evidence of pericardial effusion. Mitral Valve: The mitral valve is abnormal. Trivial mitral valve regurgitation. No evidence of mitral valve stenosis. Tricuspid Valve: The tricuspid valve is grossly normal. Tricuspid valve regurgitation is mild . No evidence of tricuspid stenosis. Aortic Valve: The aortic  valve is tricuspid. Aortic valve regurgitation is not visualized. No aortic stenosis is present. Pulmonic Valve: The pulmonic valve was not well visualized. Pulmonic valve regurgitation is mild. No evidence of pulmonic stenosis. Aorta: The aortic root and ascending aorta are structurally normal, with no evidence of dilitation. Venous: The inferior vena cava was not well visualized. IAS/Shunts: The interatrial septum was not well visualized.  LEFT VENTRICLE PLAX 2D LVIDd:         6.00 cm LVIDs:         5.40 cm LV PW:         1.10 cm LV IVS:        1.10 cm LVOT diam:     1.80 cm LVOT Area:     2.54 cm  LEFT ATRIUM         Index LA diam:    4.30 cm 2.17 cm/m   AORTA Ao Root diam: 3.20 cm TRICUSPID VALVE TR Peak grad:   41.5 mmHg TR Vmax:        322.00 cm/s  SHUNTS Systemic Diam: 1.80 cm Jodelle Red MD Electronically signed by Jodelle Red MD Signature Date/Time: 01/04/2024/1:07:14 PM    Final    DG CHEST PORT 1 VIEW Result Date: 01/02/2024 CLINICAL DATA:  Shortness of breath. EXAM: PORTABLE CHEST 1 VIEW COMPARISON:  January 02, 2024 (12:57 p.m.) FINDINGS: There is stable mild to moderate severity cardiac silhouette enlargement. There is no evidence of an acute infiltrate, pleural effusion or pneumothorax. The visualized skeletal structures are unremarkable. IMPRESSION: Stable cardiomegaly without evidence of acute or active cardiopulmonary disease. Electronically Signed   By: Aram Candela M.D.   On: 01/02/2024 22:38        Scheduled Meds:  ARIPiprazole  5 mg Oral Daily   budesonide (PULMICORT) nebulizer solution  0.25 mg Nebulization BID   carvedilol  12.5 mg Oral BID WC    enoxaparin (LOVENOX) injection  40 mg Subcutaneous Q24H   guaiFENesin  600 mg Oral BID   ipratropium-albuterol  3 mL Nebulization Q6H   methylPREDNISolone (SOLU-MEDROL) injection  40 mg Intravenous Q12H   NIFEdipine  60 mg Oral Daily   oxyCODONE  20 mg Oral Q12H   pantoprazole  40 mg Oral Daily   sodium chloride flush  3 mL Intravenous Q12H   torsemide  20 mg Oral Daily   Continuous Infusions:   LOS: 2 days    Time spent: 35 minutes.    Berton Mount, MD  [facility] Pager #: [phone] 7PM-7AM contact night coverage as above

## 2024-01-04 NOTE — Progress Notes (Signed)
Patient refusing to keep telemetry monitor on, education provided. Central Telemetry monitoring notified.

## 2024-01-04 NOTE — TOC Progression Note (Addendum)
Transition of Care [facility]) - Progression Note    Patient Details  Name: [name] MRN: [id number] Date of Birth: 01-17-1966  [facility] CM/SW Contact  Ronny Bacon, RN Phone Number: 01/04/2024, 4:05 PM  Clinical Narrative:   [facility] consult noted for DME equipment; WC, Slide board, home O2, and nebulizer machine. DME ordered through [facility] with [employer] to be delivered to bedside prior to discharge. Floor nurse and Jermaine aware that ambulation saturation note needed for home O2.    Expected Discharge Plan: Home/Self Care Barriers to Discharge: Continued Medical Work up  Expected Discharge Plan and Services     Post Acute Care Choice: Durable Medical Equipment (PCS with [facility]) Living arrangements for the past 2 months: Apartment                                       Social Determinants of Health (SDOH) Interventions SDOH Screenings   Food Insecurity: Patient Declined (01/04/2024)  Housing: Patient Declined (12/20/2023)  Transportation Needs: Patient Declined (12/20/2023)  Utilities: Patient Declined (12/20/2023)  Alcohol Screen: Medium Risk (06/24/2019)  Depression (PHQ2-9): Medium Risk (07/28/2023)  Financial Resource Strain: High Risk (07/24/2021)   Received from [facility], [facility]  Social Connections: Unknown (04/05/2022)   Received from [facility], [facility]  Stress: Stress Concern Present (07/24/2021)   Received from [facility], [facility]  Tobacco Use: High Risk (01/03/2024)    Readmission Risk Interventions    12/21/2023    9:25 AM 12/09/2022    9:02 AM 12/01/2022    3:56 PM  Readmission Risk Prevention Plan  Transportation Screening Complete Complete Complete  HRI or Home Care Consult Complete    Social Work Consult for Recovery Care Planning/Counseling Complete    Palliative Care Screening Not Applicable    Medication Review Oceanographer) Complete Complete Complete  PCP or Specialist appointment within 3-5 days  of discharge  Complete Complete  HRI or Home Care Consult  Complete Complete  SW Recovery Care/Counseling Consult  Complete Complete  Palliative Care Screening  Not Applicable Not Applicable  Skilled Nursing Facility  Not Applicable Not Applicable  SNF Comments   n/a

## 2024-01-04 NOTE — Progress Notes (Addendum)
Arrived to room at 1330. Pt initially refused PIV. Primary RN at bedside. Pt agreeable to PIV bc he would like pain medicine. Attempted x 1 in R forearm without success. Pt refused this RN to try again. States no one else can try and that he wants to go home. Offered for another IV team nurse to come and assess. Pt refused. Primary RN notified. Encouraged to reach back out to IV team if pt becomes agreeable.

## 2024-01-04 NOTE — Plan of Care (Signed)
  Problem: Education: Goal: Knowledge of General Education information will improve Description Including pain rating scale, medication(s)/side effects and non-pharmacologic comfort measures Outcome: Progressing   Problem: Elimination: Goal: Will not experience complications related to urinary retention Outcome: Progressing   Problem: Safety: Goal: Ability to remain free from injury will improve Outcome: Progressing   

## 2024-01-04 NOTE — Progress Notes (Signed)
Dr.Ogbata requesting repeat EKG for prolonged QTc interval. However, patient refusing at this time, MD notified.

## 2024-01-05 ENCOUNTER — Other Ambulatory Visit: Payer: Self-pay | Admitting: Family Medicine

## 2024-01-05 DIAGNOSIS — I5020 Unspecified systolic (congestive) heart failure: Secondary | ICD-10-CM | POA: Diagnosis not present

## 2024-01-05 LAB — RENAL FUNCTION PANEL
Albumin: 3.2 g/dL — ABNORMAL LOW (ref 3.5–5.0)
Anion gap: 14 (ref 5–15)
BUN: 36 mg/dL — ABNORMAL HIGH (ref 6–20)
CO2: 29 mmol/L (ref 22–32)
Calcium: 8.7 mg/dL — ABNORMAL LOW (ref 8.9–10.3)
Chloride: 96 mmol/L — ABNORMAL LOW (ref 98–111)
Creatinine, Ser: 1.63 mg/dL — ABNORMAL HIGH (ref 0.61–1.24)
GFR, Estimated: 49 mL/min — ABNORMAL LOW (ref >60)
Glucose, Bld: 152 mg/dL — ABNORMAL HIGH (ref 70–99)
Phosphorus: 3.5 mg/dL (ref 2.5–4.6)
Potassium: 3.7 mmol/L (ref 3.5–5.1)
Sodium: 139 mmol/L (ref 135–145)

## 2024-01-05 LAB — MAGNESIUM: Magnesium: 1.7 mg/dL (ref 1.7–2.4)

## 2024-01-05 NOTE — Plan of Care (Signed)

## 2024-01-05 NOTE — Progress Notes (Signed)
   01/05/24 0049  BiPAP/CPAP/SIPAP  Reason BIPAP/CPAP not in use Non-compliant (says he wears one at home, but does not want to wear tonight.)

## 2024-01-05 NOTE — Progress Notes (Signed)
 PROGRESS NOTE    [name]  FMW:[id number] DOB: June 06, 1966 DOA: 01/02/2024 PCP: Tanda Bleacher, MD  Outpatient Specialists:     Brief Narrative:  Patient is a 58 year old male with past medical history of significant for Schizoaffective disorder, heart failure with reduced ejection fraction, pretension, diabetes mellitus type 2, COPD, s/p bilateral amputations, chronic pain, and polysubstance abuse.  Patient was admitted with acute hypoxic respiratory failure, acute on chronic heart failure with reduced ejection fraction, and COPD with exacerbation.  Patient is slowly improving.    01/05/2024: Patient seen.  Patient is slowly improving.  Improved air entry.  Wheezing has improved.  Will continue current management.    Assessment & Plan:   Principal Problem:   Heart failure with reduced ejection fraction (HCC) Active Problems:   Bilateral lower extremity pain   Elevated troponin   Schizoaffective disorder (HCC)   Acute respiratory failure with hypoxia (HCC)   Essential hypertension   Cocaine abuse (HCC)   Mixed diabetic hyperlipidemia associated with type 2 diabetes mellitus (HCC)   Prolonged QT interval   Normocytic anemia   History of DVT (deep vein thrombosis)   COPD (chronic obstructive pulmonary disease) (HCC)   Acute respiratory failure with hypoxia Acute on chronic heart failure with reduced ejection fraction -Patient presented with acute shortness of breath.  Initially placed on BiPAP while in the emergency department.   Last echocardiogram noted EF to be less than 20% with global hypokinesis and grade 2 diastolic dysfunction from last TEE from August 2023.  Patient had initially been given Lasix  60 mg IV x 1 dose. -Continuous pulse oximetry with nasal cannula oxygen  maintain O2 saturations -Strict I&O's and daily weights -Patient is currently on torsemide  20 Mg p.o. once daily. -Continue BiDil .   -Continue to hold ACE inhibitor/ARB/Entresto  due to high normal  potassium  Echocardiogram done today, 01/04/2024 revealed:  1. Left ventricular ejection fraction, by estimation, is <20%. The left  ventricle has severely decreased function. The left ventricle demonstrates  global hypokinesis. The left ventricular internal cavity size was  moderately to severely dilated. There is   mild concentric left ventricular hypertrophy. Left ventricular diastolic  parameters are indeterminate.   2. Right ventricular systolic function was not well visualized. The right  ventricular size is mildly enlarged. There is moderately elevated  pulmonary artery systolic pressure. The estimated right ventricular  systolic pressure is 49.5 mmHg.   3. The mitral valve is abnormal. Trivial mitral valve regurgitation. No  evidence of mitral stenosis.   4. The aortic valve is tricuspid. Aortic valve regurgitation is not  visualized. No aortic stenosis is present.   Comparison(s): No significant change from prior study. No significant  change from prior. Patient refused apical, subcostal, and suprasternal  images.     Elevated troponin Acute on chronic.  Flat troponin.  476> 424.   Prolonged QT interval QTc was noted to be 514. -Avoid QT prolonging medication when possible. -Monitor potassium and magnesium  level closely and cautiously replace.  Renal function is also noted.      COPD, without exacerbation -Patient continues to wheeze. -Continue nebs Pulmicort . -Continue IV Solu-Medrol  -Continue nebs DuoNeb. -Continue to monitor prolonged QTc interval closely.     Essential hypertension -Continue to optimize. -Discontinue nifedipine . -Start BiDil .   -Hydralazine  25 Mg p.o. 4 times daily as needed for systolic blood pressure greater than 170 mmHg. -Will continue to hold ACE inhibitor/ARB as patient has high normal potassium level. -Torsemide  20 Mg p.o. once daily. -Goal blood pressure  should be less than 130/80 mmHg.   Diabetes mellitus type 2 with hyperglycemia,  not on long-term insulin  Mixed hyperlipidemia -Admission glucose noted to be 176.  During last hospitalization patient declined any blood sugar checks.  Once available hemoglobin A1c was noted to be 11.2 when checked back on 12/03/2022. -Hypoglycemic protocols -A1c of 5.8%.   -Continue to monitor and optimize.     Schizoaffective disorder Patient had been started on low-dose Abilify  during last hospitalization. -Resume Abilify    Normocytic anemia Hemoglobin 12.9 which appears similar to prior. -Continue to monitor   Left below-knee amputation  Right above-knee amputation Chronic bilateral lower extremity pain -Continue gabapentin , baclofen , and oxycodone      History of DVT/thromboembolism Patient had previously declined anticoagulation. -Continue to engage patient on the need of anticoagulation given history   Transaminitis Possibly secondary to patient being acutely fluid overloaded.   Polysubstance abuse -History of cocaine abuse. -Urine drug screen was positive for cocaine.   DVT prophylaxis: Subcutaneous Lovenox  Code Status: Full code. Family Communication:  Disposition Plan: Patient remains inpatient.  Multiple and severe comorbidities.   Consultants:  None.  Procedures:  None.  Antimicrobials:  None.   Subjective: -Shortness of breath is improved. -Wheezing is improving.    Objective: Vitals:   01/05/24 0759 01/05/24 1153 01/05/24 1507 01/05/24 1637  BP: 116/89 118/73  135/84  Pulse: 73 80  86  Resp: 18 18  18   Temp: 97.7 F (36.5 C) 97.7 F (36.5 C)  97.7 F (36.5 C)  TempSrc: Oral Oral  Oral  SpO2: 90%  95% 98%  Weight:        Intake/Output Summary (Last 24 hours) at 01/05/2024 1745 Last data filed at 01/05/2024 1111 Gross per 24 hour  Intake 720 ml  Output 2400 ml  Net -1680 ml   Filed Weights   01/02/24 1231 01/04/24 0557 01/05/24 0319  Weight: 86.2 kg 82.1 kg 75.1 kg    Examination:  General exam: Patient is not in any obvious acute  respiratory distress.   Respiratory system: Decreased air entry with expiratory wheeze.   Cardiovascular system: S1 & S2  Gastrointestinal system: Abdomen is soft and nontender.   Central nervous system: Alert and oriented. . Extremities: Amputation of bilateral lower extremities.  Data Reviewed: I have personally reviewed following labs and imaging studies  CBC: Recent Labs  Lab 01/02/24 1232 01/02/24 2315 01/03/24 0538 01/04/24 1251  WBC 6.5  --  6.3 13.3*  NEUTROABS 4.0  --   --  12.0*  HGB 12.9* 13.3 13.3 14.2  HCT 39.7 39.0 40.5 43.5  MCV 99.7  --  97.8 97.3  PLT 263  --  278 331   Basic Metabolic Panel: Recent Labs  Lab 01/03/24 0538 01/03/24 1005 01/04/24 1026 01/04/24 1251 01/05/24 0252  NA 133* 135 140 141 139  K 5.7* 5.0 3.8 4.2 3.7  CL 102 102 96* 99 96*  CO2 20* 21* 29 27 29   GLUCOSE 151* 148* 124* 112* 152*  BUN 28* 31* 35* 36* 36*  CREATININE 1.24 1.38* 1.35* 1.49* 1.63*  CALCIUM  8.9 8.9 9.5 9.3 8.7*  MG  --  2.0 1.9  --  1.7  PHOS  --  3.8 3.8 4.1 3.5   GFR: Estimated Creatinine Clearance: 51.2 mL/min (A) (by C-G formula based on SCr of 1.63 mg/dL (H)). Liver Function Tests: Recent Labs  Lab 01/02/24 1232 01/03/24 0538 01/03/24 1005 01/04/24 1026 01/04/24 1251 01/05/24 0252  AST 70* 31  --   --   --   --  ALT 56* 48*  --   --   --   --   ALKPHOS 111 108  --   --   --   --   BILITOT 2.0* 1.5*  --   --   --   --   PROT 6.5 6.7  --   --   --   --   ALBUMIN 3.3* 3.3* 3.1* 3.9 3.4* 3.2*   No results for input(s): LIPASE, AMYLASE in the last 168 hours. No results for input(s): AMMONIA in the last 168 hours. Coagulation Profile: No results for input(s): INR, PROTIME in the last 168 hours. Cardiac Enzymes: No results for input(s): CKTOTAL, CKMB, CKMBINDEX, TROPONINI in the last 168 hours. BNP (last 3 results) No results for input(s): PROBNP in the last 8760 hours. HbA1C: No results for input(s): HGBA1C in the last 72  hours.  CBG: No results for input(s): GLUCAP in the last 168 hours. Lipid Profile: No results for input(s): CHOL, HDL, LDLCALC, TRIG, CHOLHDL, LDLDIRECT in the last 72 hours. Thyroid  Function Tests: No results for input(s): TSH, T4TOTAL, FREET4, T3FREE, THYROIDAB in the last 72 hours. Anemia Panel: No results for input(s): VITAMINB12, FOLATE, FERRITIN, TIBC, IRON, RETICCTPCT in the last 72 hours. Urine analysis:    Component Value Date/Time   COLORURINE YELLOW 01/02/2024 1454   APPEARANCEUR CLEAR 01/02/2024 1454   LABSPEC >1.030 (H) 01/02/2024 1454   PHURINE 5.5 01/02/2024 1454   GLUCOSEU NEGATIVE 01/02/2024 1454   HGBUR NEGATIVE 01/02/2024 1454   BILIRUBINUR SMALL (A) 01/02/2024 1454   KETONESUR NEGATIVE 01/02/2024 1454   PROTEINUR 100 (A) 01/02/2024 1454   NITRITE NEGATIVE 01/02/2024 1454   LEUKOCYTESUR NEGATIVE 01/02/2024 1454   Sepsis Labs: @LABRCNTIP (procalcitonin:4,lacticidven:4)  ) Recent Results (from the past 240 hours)  Resp panel by RT-PCR (RSV, Flu A&B, Covid) Anterior Nasal Swab     Status: None   Collection Time: 01/02/24 12:33 PM   Specimen: Anterior Nasal Swab  Result Value Ref Range Status   SARS Coronavirus 2 by RT PCR NEGATIVE NEGATIVE Final   Influenza A by PCR NEGATIVE NEGATIVE Final   Influenza B by PCR NEGATIVE NEGATIVE Final    Comment: (NOTE) The Xpert Xpress SARS-CoV-2/FLU/RSV plus assay is intended as an aid in the diagnosis of influenza from Nasopharyngeal swab specimens and should not be used as a sole basis for treatment. Nasal washings and aspirates are unacceptable for Xpert Xpress SARS-CoV-2/FLU/RSV testing.  Fact Sheet for Patients: bloggercourse.com  Fact Sheet for Healthcare Providers: seriousbroker.it  This test is not yet approved or cleared by the [place]  FDA and has been authorized for detection and/or diagnosis of SARS-CoV-2 by FDA  under an Emergency Use Authorization (EUA). This EUA will remain in effect (meaning this test can be used) for the duration of the COVID-19 declaration under Section 564(b)(1) of the Act, 21 U.S.C. section 360bbb-3(b)(1), unless the authorization is terminated or revoked.     Resp Syncytial Virus by PCR NEGATIVE NEGATIVE Final    Comment: (NOTE) Fact Sheet for Patients: bloggercourse.com  Fact Sheet for Healthcare Providers: seriousbroker.it  This test is not yet approved or cleared by the [place]  FDA and has been authorized for detection and/or diagnosis of SARS-CoV-2 by FDA under an Emergency Use Authorization (EUA). This EUA will remain in effect (meaning this test can be used) for the duration of the COVID-19 declaration under Section 564(b)(1) of the Act, 21 U.S.C. section 360bbb-3(b)(1), unless the authorization is terminated or revoked.  Performed at [facility]  Lab, [address], [place], [name] [address]          Radiology Studies: ECHOCARDIOGRAM COMPLETE Result Date: 01/04/2024    ECHOCARDIOGRAM REPORT   Patient Name:   [name] Date of Exam: 01/04/2024 Medical Rec #:  [id number]      Height:       69.0 in Accession #:    [id number]     Weight:       181.0 lb Date of Birth:  July 13, 1966      BSA:          1.980 m Patient Age:    57 years       BP:           95/74 mmHg Patient Gender: M              HR:           65 bpm. Exam Location:  Inpatient Procedure: 2D Echo, Cardiac Doppler and Color Doppler Indications:    CHF- Acute Systolic  History:        Patient has prior history of Echocardiogram examinations, most                 recent 08/18/2022. COPD; Risk Factors:Hypertension, Diabetes,                 Dyslipidemia and Current Smoker.  Sonographer:    Ozell Free Referring Phys: [id number] SUBRINA SUNDIL  Sonographer Comments: Image acquisition challenging due to patient behavioral factors. and Image acquisition  challenging due to respiratory motion. IMPRESSIONS  1. Left ventricular ejection fraction, by estimation, is <20%. The left ventricle has severely decreased function. The left ventricle demonstrates global hypokinesis. The left ventricular internal cavity size was moderately to severely dilated. There is  mild concentric left ventricular hypertrophy. Left ventricular diastolic parameters are indeterminate.  2. Right ventricular systolic function was not well visualized. The right ventricular size is mildly enlarged. There is moderately elevated pulmonary artery systolic pressure. The estimated right ventricular systolic pressure is 49.5 mmHg.  3. The mitral valve is abnormal. Trivial mitral valve regurgitation. No evidence of mitral stenosis.  4. The aortic valve is tricuspid. Aortic valve regurgitation is not visualized. No aortic stenosis is present. Comparison(s): No significant change from prior study. No significant change from prior. Patient refused apical, subcostal, and suprasternal images. FINDINGS  Left Ventricle: Left ventricular ejection fraction, by estimation, is <20%. The left ventricle has severely decreased function. The left ventricle demonstrates global hypokinesis. The left ventricular internal cavity size was moderately to severely dilated. There is mild concentric left ventricular hypertrophy. Left ventricular diastolic parameters are indeterminate. Right Ventricle: The right ventricular size is mildly enlarged. Right vetricular wall thickness was not well visualized. Right ventricular systolic function was not well visualized. There is moderately elevated pulmonary artery systolic pressure. The tricuspid regurgitant velocity is 3.22 m/s, and with an assumed right atrial pressure of 8 mmHg, the estimated right ventricular systolic pressure is 49.5 mmHg. Left Atrium: Left atrial size was not well visualized. Right Atrium: Right atrial size was not well visualized. Pericardium: There is no  evidence of pericardial effusion. Mitral Valve: The mitral valve is abnormal. Trivial mitral valve regurgitation. No evidence of mitral valve stenosis. Tricuspid Valve: The tricuspid valve is grossly normal. Tricuspid valve regurgitation is mild . No evidence of tricuspid stenosis. Aortic Valve: The aortic valve is tricuspid. Aortic valve regurgitation is not visualized. No aortic stenosis is present. Pulmonic Valve: The pulmonic valve was not well visualized.  Pulmonic valve regurgitation is mild. No evidence of pulmonic stenosis. Aorta: The aortic root and ascending aorta are structurally normal, with no evidence of dilitation. Venous: The inferior vena cava was not well visualized. IAS/Shunts: The interatrial septum was not well visualized.  LEFT VENTRICLE PLAX 2D LVIDd:         6.00 cm LVIDs:         5.40 cm LV PW:         1.10 cm LV IVS:        1.10 cm LVOT diam:     1.80 cm LVOT Area:     2.54 cm  LEFT ATRIUM         Index LA diam:    4.30 cm 2.17 cm/m   AORTA Ao Root diam: 3.20 cm TRICUSPID VALVE TR Peak grad:   41.5 mmHg TR Vmax:        322.00 cm/s  SHUNTS Systemic Diam: 1.80 cm Shelda Bruckner MD Electronically signed by Shelda Bruckner MD Signature Date/Time: 01/04/2024/1:07:14 PM    Final         Scheduled Meds:  ARIPiprazole   5 mg Oral Daily   budesonide  (PULMICORT ) nebulizer solution  0.25 mg Nebulization BID   carvedilol   12.5 mg Oral BID WC   enoxaparin  (LOVENOX ) injection  40 mg Subcutaneous Q24H   guaiFENesin   600 mg Oral BID   ipratropium-albuterol   3 mL Nebulization Q6H   isosorbide -hydrALAZINE   1 tablet Oral TID   methylPREDNISolone  (SOLU-MEDROL ) injection  40 mg Intravenous Q12H   oxyCODONE   20 mg Oral Q12H   pantoprazole   40 mg Oral Daily   sodium chloride  flush  3 mL Intravenous Q12H   torsemide   20 mg Oral Daily   Continuous Infusions:  magnesium  sulfate bolus IVPB       LOS: 3 days    Time spent: 35 minutes.    Leatrice Chapel, MD  [facility] Pager #: [phone] 7PM-7AM contact night coverage as above

## 2024-01-05 NOTE — Progress Notes (Signed)
 Occupational Therapy Treatment Patient Details Name: [name] MRN: [id number] DOB: 11-16-1966 Today's Date: 01/05/2024   History of present illness Pt is a 58 y.o. male presenting 01/02/24 with shortness of breath for 1 week. CXR stable cardiomegaly with no active disease. Recent hospitalization 1/19-1/21 for acute on chronic heart failure exacerbation. PMH significant of  Schizoaffective disorder, heart failure with reduced ejection fraction, pretension, DMII, COPD, s/p bilateral amputations, chronic pain, and polysubstance abuse   OT comments  Pt pleasant, initially declining OOB activity with OT but with rapport building in collaboration with familiar PT, pt agreeable to participate. Pt requires overall Supervision for wheelchair transfer and Modified Independent with toileting task in bathroom during session. Pt appears close to baseline but would benefit from [facility] follow up if possible to maximize safety at home. Pt will also benefit from a new wheelchair as current one is damaged.       If plan is discharge home, recommend the following:  Assistance with cooking/housework;Assist for transportation;Direct supervision/assist for medications management;Direct supervision/assist for financial management   Equipment Recommendations  Wheelchair (measurements OT);Wheelchair cushion (measurements OT) (needs new wheelchair)    Recommendations for Other Services      Precautions / Restrictions Precautions Precautions: Fall Precaution Comments: L BKA; R AKA Restrictions Weight Bearing Restrictions Per Provider Order: No       Mobility Bed Mobility Overal bed mobility: Independent                  Transfers Overall transfer level: Needs assistance Equipment used: None Transfers: Bed to chair/wheelchair/BSC         Anterior-Posterior transfers: Supervision   General transfer comment: pt requesting therapist to hold wheelchair secure while he scooted back into wheelchair.  small gap noted though pt declined to have therapist scoot wheelchair closer     Balance Overall balance assessment: No apparent balance deficits (not formally assessed)                                         ADL either performed or assessed with clinical judgement   ADL Overall ADL's : Needs assistance/impaired                             Toileting- Clothing Manipulation and Hygiene: Modified independent Toileting - Clothing Manipulation Details (indicate cue type and reason): pt able to mobilize wheelchair to/from bathroom, get close enough to toilet to lift seat lid and urinate into toilet without transferring and without assist     Functional mobility during ADLs: Modified independent;Wheelchair General ADL Comments: appears near baseline, able to manage ADLs from a wheelchair or bed level without physical assist. supervision for safety    Extremity/Trunk Assessment Upper Extremity Assessment Upper Extremity Assessment: Right hand dominant;LUE deficits/detail LUE Deficits / Details: hx of CVA with coordination and tremulous issues last admission. appeared [facility] today   Lower Extremity Assessment Lower Extremity Assessment: Defer to PT evaluation        Vision   Vision Assessment?: No apparent visual deficits   Perception     Praxis      Cognition Arousal: Alert Behavior During Therapy: Restless, WFL for tasks assessed/performed, Impulsive Overall Cognitive Status: History of cognitive impairments - at baseline  General Comments: hx of schizophrenia. restless and can be impulsive but responds well to humor and overall pleasant today. Pt requires encouragement to participate but ultimately prefers to direct session with own preferences        Exercises      Shoulder Instructions       General Comments      Pertinent Vitals/ Pain       Pain Assessment Pain Assessment: No/denies  pain  Home Living                                          Prior Functioning/Environment              Frequency  Min 1X/week        Progress Toward Goals  OT Goals(current goals can now be found in the care plan section)  Progress towards OT goals: Progressing toward goals  Acute Rehab OT Goals Patient Stated Goal: decrease SOB OT Goal Formulation: With patient Time For Goal Achievement: 01/17/24 Potential to Achieve Goals: Fair ADL Goals Pt Will Perform Lower Body Bathing: with modified independence;sitting/lateral leans Pt Will Perform Lower Body Dressing: with modified independence;sitting/lateral leans Pt Will Transfer to Toilet: with supervision Pt Will Perform Toileting - Clothing Manipulation and hygiene: with modified independence;sitting/lateral leans Additional ADL Goal #1: Patient will demonstrate ability to gather supplies in room for ADL task with Supervision from wheelchair level.  Plan      Co-evaluation    PT/OT/SLP Co-Evaluation/Treatment: Yes Reason for Co-Treatment: Necessary to address cognition/behavior during functional activity;Other (comment) (rapport building)   OT goals addressed during session: ADL's and self-care      AM-PAC OT 6 Clicks Daily Activity     Outcome Measure   Help from another person eating meals?: None Help from another person taking care of personal grooming?: A Little Help from another person toileting, which includes using toliet, bedpan, or urinal?: A Little Help from another person bathing (including washing, rinsing, drying)?: A Little Help from another person to put on and taking off regular upper body clothing?: A Little Help from another person to put on and taking off regular lower body clothing?: A Little 6 Click Score: 19    End of Session Equipment Utilized During Treatment: Other (comment) (wheelchair)  OT Visit Diagnosis: Other abnormalities of gait and mobility (R26.89)    Activity Tolerance Patient tolerated treatment well   Patient Left Other (comment) (in wheelchair with PT)   Nurse Communication Mobility status        Time: 8863-8796 OT Time Calculation (min): 27 min  Charges: OT General Charges $OT Visit: 1 Visit OT Treatments $Self Care/Home Management : 8-22 mins  Mliss NOVAK, OTR/L Acute Rehab Services Office: [phone]   Mliss Getting 01/05/2024, 12:23 PM

## 2024-01-05 NOTE — Telephone Encounter (Signed)
I called Mr. Einar Crow on behalf of patient to nform him that patient would need to contact the provider in which has prescribe the medication in question.

## 2024-01-05 NOTE — Progress Notes (Signed)
 Mobility Specialist Progress Note:   01/05/24 0945  Mobility  Activity  (transferred to w/c + propel)  Level of Assistance Contact guard assist, steadying assist  Assistive Device Wheelchair  Distance Ambulated (ft) 500 ft (propelled in w/c)  Activity Response Tolerated well  Mobility Referral Yes  Mobility visit 1 Mobility  Mobility Specialist Start Time (ACUTE ONLY) 0945  Mobility Specialist Stop Time (ACUTE ONLY) 1000  Mobility Specialist Time Calculation (min) (ACUTE ONLY) 15 min   Pt agreeable to mobility session. Required minG assist for transferring to w/c for safety. Pt presenting with incr strength during todays session. Less wheezing/crackling noted today. Pt left in w/c, in room with all needs met.   Therisa [name] Mobility Specialist Please contact via SecureChat or  Rehab office at [phone]

## 2024-01-05 NOTE — Progress Notes (Signed)
 Physical Therapy Treatment and Discharge Patient Details Name: [name] MRN: [id number] DOB: Mar 15, 1966 Today's Date: 01/05/2024   History of Present Illness Pt is a 58 y.o. male presenting 01/02/24 with shortness of breath for 1 week. CXR stable cardiomegaly with no active disease. Recent hospitalization 1/19-1/21 for acute on chronic heart failure exacerbation. PMH significant of  Schizoaffective disorder, heart failure with reduced ejection fraction, pretension, DMII, COPD, s/p bilateral amputations, chronic pain, and polysubstance abuse    PT Comments  Continuing work on functional mobility and activity tolerance; [name] is well known to this PT, and I am happy to report that overall his transfers are smoother than when he discharged to his apartment after a long hospital stay in 2024; he has been participating and working with HHPT, and it shows; he was able to perform bed mobility, wheelchair, transfers, and wheelchair management and propulsion with relative ease; no overt SOB or DOE; we rolled all the way to the [employer], and back; patient was on room air the entire time, and his O2 sat at spot check for 98%; do not feel strongly he needs supplemental O2 at this time; acute PT goals are met, will discharge physical therapy at this time, and encourage [name] to participate with Mobility team; moving well enough to discharge home    If plan is discharge home, recommend the following:     Can travel by private vehicle     No (but, consider medical fleeta transport)  Geophysical Data Processor (measurements PT);Wheelchair cushion (measurements PT) (Rec amputee legrest for LLE; anti-tippers, please)    Recommendations for Other Services       Precautions / Restrictions Precautions Precautions: Fall (fall risk is present, but minimal) Precaution Comments: L BKA; R Knee disarticulation Restrictions Weight Bearing Restrictions Per Provider Order: No     Mobility  Bed  Mobility Overal bed mobility: Independent                  Transfers   Equipment used: None Transfers: Bed to chair/wheelchair/BSC         Anterior-Posterior transfers: Supervision   General transfer comment: pt requesting therapist to hold wheelchair secure while he scooted back into wheelchair. small gap noted though pt declined to have therapist scoot wheelchair closer; did not see risk of tipping    Ambulation/Gait                   Psychologist, Counselling mobility: Yes Wheelchair propulsion: Both upper extremities Wheelchair parts: Independent Distance: 6000 Wheelchair Assistance Details (indicate cue type and reason): Self-propelled wheelchair 80% of the distance to and from 5N   Tilt Bed    Modified Rankin (Stroke Patients Only)       Balance Overall balance assessment: No apparent balance deficits (not formally assessed)                                          Cognition Arousal: Alert Behavior During Therapy: Restless, WFL for tasks assessed/performed, Impulsive Overall Cognitive Status: History of cognitive impairments - at baseline                                 General Comments: hx of schizophrenia. restless and can be  impulsive but responds well to humor and overall pleasant today. Pt requires encouragement to participate but ultimately prefers to direct session with own preferences        Exercises      General Comments General comments (skin integrity, edema, etc.): Entire session conducted on room air; O2 sat 98%, HR 66bpm during spot check on 5N; More pleasant today; Interested in visiting the unit he spent so much time on last year      Pertinent Vitals/Pain Pain Assessment Pain Assessment: No/denies pain Pain Intervention(s): Monitored during session    Home Living                          Prior Function            PT  Goals (current goals can now be found in the care plan section) Acute Rehab PT Goals Patient Stated Goal: would like a new wheelchair Progress towards PT goals: Goals met/education completed, patient discharged from PT    Frequency    Min 1X/week      PT Plan      Co-evaluation   Reason for Co-Treatment: Necessary to address cognition/behavior during functional activity;Other (comment) (rapport building; dove-tailed the session) PT goals addressed during session: Mobility/safety with mobility;Balance OT goals addressed during session: ADL's and self-care      AM-PAC PT 6 Clicks Mobility   Outcome Measure  Help needed turning from your back to your side while in a flat bed without using bedrails?: None Help needed moving from lying on your back to sitting on the side of a flat bed without using bedrails?: None Help needed moving to and from a bed to a chair (including a wheelchair)?: None Help needed standing up from a chair using your arms (e.g., wheelchair or bedside chair)?: Total Help needed to walk in hospital room?: Total Help needed climbing 3-5 steps with a railing? : Total 6 Click Score: 15    End of Session   Activity Tolerance: Patient tolerated treatment well Patient left: Other (comment) (managign independently in [facility] in his room) Nurse Communication: Mobility status PT Visit Diagnosis: Muscle weakness (generalized) (M62.81)     Time: 8851-8769 PT Time Calculation (min) (ACUTE ONLY): 42 min  Charges:    $Therapeutic Activity: 8-22 mins $Self Care/Home Management: 8-22 $Wheel Chair Management: 8-22 mins PT General Charges $$ ACUTE PT VISIT: 1 Visit                     [name], PT  Acute Rehabilitation Services Office [phone] Secure Chat welcomed    [name] 01/05/2024, 3:21 PM

## 2024-01-06 DIAGNOSIS — I5020 Unspecified systolic (congestive) heart failure: Secondary | ICD-10-CM | POA: Diagnosis not present

## 2024-01-06 DIAGNOSIS — D649 Anemia, unspecified: Secondary | ICD-10-CM | POA: Diagnosis not present

## 2024-01-06 DIAGNOSIS — I10 Essential (primary) hypertension: Secondary | ICD-10-CM | POA: Diagnosis not present

## 2024-01-06 DIAGNOSIS — J9601 Acute respiratory failure with hypoxia: Secondary | ICD-10-CM | POA: Diagnosis not present

## 2024-01-06 MED ORDER — OXYCODONE HCL ER 10 MG PO T12A: 10.0000 mg | EXTENDED_RELEASE_TABLET | Freq: Two times a day (BID) | ORAL | 0 refills | Status: DC

## 2024-01-06 MED ORDER — CARVEDILOL 12.5 MG PO TABS: 12.5000 mg | ORAL_TABLET | Freq: Two times a day (BID) | ORAL | 3 refills | Status: DC

## 2024-01-06 MED ORDER — OXYCODONE HCL 5 MG PO TABS: 5.0000 mg | ORAL_TABLET | Freq: Three times a day (TID) | ORAL | 0 refills | Status: DC | PRN

## 2024-01-06 MED ORDER — PREDNISONE 20 MG PO TABS: 40.0000 mg | ORAL_TABLET | Freq: Every day | ORAL | 0 refills | Status: AC

## 2024-01-06 MED ORDER — PANTOPRAZOLE SODIUM 40 MG PO TBEC: 40.0000 mg | DELAYED_RELEASE_TABLET | Freq: Every day | ORAL | 2 refills | Status: DC

## 2024-01-06 MED ORDER — ISOSORB DINITRATE-HYDRALAZINE 20-37.5 MG PO TABS: 1.0000 | ORAL_TABLET | Freq: Three times a day (TID) | ORAL | 3 refills | Status: DC

## 2024-01-06 MED ORDER — GABAPENTIN 300 MG PO CAPS: 600.0000 mg | ORAL_CAPSULE | Freq: Three times a day (TID) | ORAL | 2 refills | Status: DC

## 2024-01-06 MED ORDER — TORSEMIDE 20 MG PO TABS: 40.0000 mg | ORAL_TABLET | Freq: Every day | ORAL | 3 refills | Status: DC

## 2024-01-06 NOTE — TOC Transition Note (Signed)
 Transition of Care [facility]) - Discharge Note   Patient Details  Name: [name] MRN: [id number] Date of Birth: June 24, 1966  Transition of Care [facility] CM/SW Contact:  Waddell Barnie Rama, RN Phone Number: 01/06/2024, 3:24 PM   Clinical Narrative:    NCM contacted [employer] for DME, which is on the way, for w/chair, sliding board, home oxygen  and neb machine.  Patient is bil amputee and will need safe transport, which has been called, will call them to transport patient when DME gets to the room.    Final next level of care: Home/Self Care Barriers to Discharge: Continued Medical Work up   Patient Goals and CMS Choice            Discharge Placement                       Discharge Plan and Services Additional resources added to the After Visit Summary for       Post Acute Care Choice: Durable Medical Equipment (PCS with [facility])                               Social Drivers of Health (SDOH) Interventions SDOH Screenings   Food Insecurity: Patient Declined (01/04/2024)  Housing: Patient Declined (12/20/2023)  Transportation Needs: Patient Declined (12/20/2023)  Utilities: Patient Declined (12/20/2023)  Alcohol Screen: Medium Risk (06/24/2019)  Depression (PHQ2-9): Medium Risk (07/28/2023)  Financial Resource Strain: High Risk (07/24/2021)   Received from [facility]  Social Connections: Unknown (04/05/2022)   Received from [facility], [facility]  Stress: Stress Concern Present (07/24/2021)   [facility]  Tobacco Use: High Risk (01/03/2024)     Readmission Risk Interventions    12/21/2023    9:25 AM 12/09/2022    9:02 AM 12/01/2022    3:56 PM  Readmission Risk Prevention Plan  Transportation Screening Complete Complete Complete  HRI or Home Care Consult Complete    Social Work Consult for Recovery Care Planning/Counseling Complete    Palliative Care Screening Not Applicable    Medication Review Furniture Conservator/restorer) Complete Complete Complete  PCP or Specialist appointment within 3-5 days of discharge  Complete Complete  HRI or Home Care Consult  Complete Complete  SW Recovery Care/Counseling Consult  Complete Complete  Palliative Care Screening  Not Applicable Not Applicable  Skilled Nursing Facility  Not Applicable Not Applicable  SNF Comments   n/a

## 2024-01-06 NOTE — Progress Notes (Signed)
 Mobility Specialist Progress Note:   01/06/24 1010  Mobility  Activity  (transfer to w/c + self propel in w/c)  Level of Assistance Standby assist, set-up cues, supervision of patient - no hands on  Assistive Device Wheelchair  Activity Response Tolerated well  Mobility Referral Yes  Mobility visit 1 Mobility  Mobility Specialist Start Time (ACUTE ONLY) 1010  Mobility Specialist Stop Time (ACUTE ONLY) 1030  Mobility Specialist Time Calculation (min) (ACUTE ONLY) 20 min   Pt agreeable to mobility session. Able to transfer to w/c from bed with only supervision. Able to self-propel in hallway with no assistance.   Second session 11:30; pt propelled to 5N to visit staff, required only supervision to transfer back to bed. Left with all needs met.   Therisa [name] Mobility Specialist Please contact via SecureChat or  Rehab office at [phone]

## 2024-01-06 NOTE — Progress Notes (Signed)
   01/06/24 0033  BiPAP/CPAP/SIPAP  Reason BIPAP/CPAP not in use Non-compliant (Refused)

## 2024-01-06 NOTE — Discharge Summary (Signed)
 Physician Discharge Summary   Patient: [name] MRN: [id number] DOB: 1966-01-07  Admit date:     01/02/2024  Discharge date: 01/06/24  Discharge Physician: Eric Nunnery   PCP: Tanda Bleacher, MD   Recommendations at discharge:  Repeat basic metabolic panel to follow electrolytes and renal function Reassess blood pressure and adjust antihypertensive agents as needed Patient to follow-up with cardiology service for further adjustment to his GDMT regimen. Reassess patient response to analgesic therapy and referred to pain clinic management for future care.  Discharge Diagnoses: Principal Problem:   Heart failure with reduced ejection fraction (HCC) Active Problems:   Acute respiratory failure with hypoxia (HCC)   Elevated troponin   Prolonged QT interval   COPD (chronic obstructive pulmonary disease) (HCC)   Essential hypertension   Mixed diabetic hyperlipidemia associated with type 2 diabetes mellitus (HCC)   Schizoaffective disorder (HCC)   Normocytic anemia   Bilateral lower extremity pain   History of DVT (deep vein thrombosis)   Cocaine abuse [facility])  Brief Hospital admission course: As per H&P written by Dr. Claudene on 01/02/2024 [name] is a 58 y.o. male with medical history significant of  Schizoaffective disorder, heart failure with reduced ejection fraction, pretension, diabetes mellitus type 2, COPD, s/p bilateral amputations, chronic pain, and polysubstance abuse who presents with shortness of breath.   Records note patient had just been hospitalized from 1/19-1/21 for acute on chronic heart failure exacerbation with reduced ejection fraction.  Thought secondary to poor compliance with medications and cocaine abuse.  He reports that over the last week he has had progressively worsening shortness of breath.  Reports having a cough in addition to the left leg swelling and pain related to his prior amputations.  He reports having some left-sided chest pain and  discomfort as well.  Denies having any significant fever.   In the emergency department patient was noted to be afebrile tachypnea and blood pressure elevated 164/113.  He was initially transition from CPAP to BiPAP.  However patient took this off after being on it for an hour.  Labs significant for glucose 176, BUN 19, creatinine 1.22, AST 70, ALT 56, total bilirubin 2,  and troponin 476.  Chest x-ray noted stable cardiomegaly with no active disease.  Influenza, COVID-19, and RSV screening were negative.  Patient had been given Lasix  60 mg IV x 1 dose.  Assessment and Plan: 1-acute respiratory failure with hypoxia secondary to acute on chronic systolic heart failure -Recent EF <  20% -Good response to IV diuresis -On presentation demonstrating elevated potassium level and patient reporting some intermittent noncompliance. -Continue treatment with torsemide  at discharge. -Patient will use beta-blocker and BiDil  as part of GDMT; ACE inhibitor/ARB and Entresto  not initiated or recommended with hyperkalemia. -Continue to follow-up with cardiology service as an outpatient.  2-elevated troponin -Flat and appears to be associated with demand ischemia from heart failure exacerbation -Patient has been stabilized and discharged home medications as mentioned above -Continue outpatient follow-up with cardiology service.  3-prolonged QT -Avoid medications that can further prolong QT -Electrolytes within normal limits (potassium around 4 magnesium  around 2) -QTc was noted to be around 514. -Repeat EKG at follow-up visit.  4-COPD mild exacerbation -Continue treatment with steroids (40 mg daily x 5 days). -Resume home bronchodilator management and the use of as needed mucolytic's/antitussive  regimen.  5-GERD -Continue PPI.  6-type 2 diabetes with hyperglycemia -Most recent A1c 5.8 -Continue following modified carbohydrate diet and resume home hypoglycemic regimen.  7-hypertension -Continue  treatment  with BiDil , Demadex  and beta-blocker. -Follow-up blood pressure and further adjust antihypertensive treatment as needed.  8-schizoaffective disorder -Continue Abilify  -No suicidal ideation or hallucinations appreciated.  9-history of DVT/thromboembolic disease  -patient has declined the use of lesions and continue engaging into further discussion to determine the need for chronic therapy.  10-history of polysubstance abuse -Cessation counseling provided.  11-left below-knee amputation/right above-knee amputation and chronic pain syndrome. -Continue the use of gabapentin , oxycodone  and muscle relaxants. -Short amount prescription provided that he has run out of medications to last until he follow-up with PCP. -Patient will benefit of outpatient pain clinic management.  Consultants: None Procedures performed: See below for x-ray reports. Disposition: Home Diet recommendation: Heart healthy/modified carbohydrate.  DISCHARGE MEDICATION: Allergies as of 01/06/2024       Reactions   Acetaminophen  Hives, Nausea And Vomiting, Rash, Anaphylaxis   PT states that if he takes this he is going to the hospital   Motrin  [ibuprofen ] Anaphylaxis   Nsaids Anaphylaxis   PT states that if he takes this he is going to the hospital   Lyrica  Cr [pregabalin  Er] Other (See Comments)   Dystonia Dysarthria    Peanut-containing Drug Products Other (See Comments)   Pt denies allergy to peanut butter 03/02/2021        Medication List     STOP taking these medications    furosemide  20 MG tablet Commonly known as: LASIX        TAKE these medications    ARIPiprazole  5 MG tablet Commonly known as: ABILIFY  Take 1 tablet (5 mg total) by mouth daily.   Baclofen  5 MG Tabs Take 1 tablet (5 mg total) by mouth 3 (three) times daily as needed.   carvedilol  12.5 MG tablet Commonly known as: COREG  Take 1 tablet (12.5 mg total) by mouth 2 (two) times daily with a meal.   gabapentin  300 MG  capsule Commonly known as: NEURONTIN  Take 2 capsules (600 mg total) by mouth 3 (three) times daily. What changed: how much to take   hydrOXYzine  10 MG tablet Commonly known as: ATARAX  Take 1 tablet (10 mg total) by mouth 3 (three) times daily as needed for anxiety.   isosorbide -hydrALAZINE  20-37.5 MG tablet Commonly known as: BIDIL  Take 1 tablet by mouth 3 (three) times daily.   oxyCODONE  10 mg 12 hr tablet Commonly known as: OXYCONTIN  Take 1 tablet (10 mg total) by mouth every 12 (twelve) hours. What changed: how much to take   oxyCODONE  5 MG immediate release tablet Commonly known as: Roxicodone  Take 1 tablet (5 mg total) by mouth every 8 (eight) hours as needed for moderate pain (pain score 4-6) or breakthrough pain. What changed: You were already taking a medication with the same name, and this prescription was added. Make sure you understand how and when to take each.   pantoprazole  40 MG tablet Commonly known as: PROTONIX  Take 1 tablet (40 mg total) by mouth daily. Start taking on: January 07, 2024   predniSONE  20 MG tablet Commonly known as: DELTASONE  Take 2 tablets (40 mg total) by mouth daily with breakfast for 5 days.   torsemide  20 MG tablet Commonly known as: DEMADEX  Take 2 tablets (40 mg total) by mouth daily. Start taking on: January 07, 2024   Ventolin  HFA 108 (90 Base) MCG/ACT inhaler Generic drug: albuterol  INHALE TWO PUFFS INTO THE LUNGS EVERY 6 (SIX) HOURS AS NEEDED FOR WHEEZING OR SHORTNESS OF BREATH  Durable Medical Equipment  (From admission, onward)           Start     Ordered   01/04/24 1621  For home use only DME Other see comment  Once       Comments: Slide Board  Question:  Length of Need  Answer:  Lifetime   01/04/24 1624   01/04/24 1621  For home use only DME oxygen   Once       Question Answer Comment  Length of Need 6 Months   Liters per Minute 3   Frequency Continuous (stationary and portable oxygen  unit  needed)   Oxygen  delivery system Gas      01/04/24 1624   01/04/24 1620  For home use only DME Nebulizer machine  Once       Question Answer Comment  Patient needs a nebulizer to treat with the following condition Shortness of breath   Length of Need Lifetime   Additional equipment included Administration kit   Additional equipment included Filter      01/04/24 1624   01/04/24 1618  For home use only DME standard manual wheelchair with seat cushion  Once       Comments: Patient suffers from Bilateral lower extremity amputations which impairs their ability to perform daily activities like bathing, dressing, feeding, grooming, and toileting in the home.  A cane, crutch, or walker will not resolve issue with performing activities of daily living. A wheelchair will allow patient to safely perform daily activities. Patient can safely propel the wheelchair in the home or has a caregiver who can provide assistance. Length of need Lifetime. Accessories: elevating leg rests (ELRs), wheel locks, extensions and anti-tippers.   01/04/24 1624            Follow-up Information     [facility] Follow up on 01/07/2024.   Specialty: Family Medicine Why: 09:20 AM  previously scheduled Contact information: [address], [address] [place]  [address] [phone] Additional information: [address]  [address]  [place], [name] [address]    - Parking lot with [employer], [employer], [employer]; Next to [facility]   - [facility] shopping - Off of [address]          Wilson, [name], MD. Schedule an appointment as soon as possible for a visit in 10 day(s).   Specialty: Family Medicine Contact information: [address] [name] [address] [phone]                Discharge Exam: Fredricka Weights   01/04/24 0557 01/05/24 0319 01/06/24 0439  Weight: 82.1 kg 75.1 kg 75 kg   General exam:  Alert, awake, oriented x 3; no chest pain, no nausea, no vomiting.  Eager to go home. Respiratory system: Improved air movement bilaterally; no requiring oxygen  supplementation and feeling ready to go home.  Good saturation on room air appreciated.  No wheezing or crackles. Cardiovascular system:RRR.  No rubs, no gallops, no JVD. Gastrointestinal system: Abdomen is nondistended, soft and nontender. No organomegaly or masses felt. Normal bowel sounds heard. Central nervous system: No focal neurological deficits. Extremities: No cyanosis or clubbing; left BKA and right sided AKA. Skin: No petechiae or open wounds. Psychiatry: Mood & affect appropriate.    Condition at discharge: Stable and improved.  The results of significant diagnostics from this hospitalization (including imaging, microbiology, ancillary and laboratory) are listed below for reference.   Imaging Studies: ECHOCARDIOGRAM  COMPLETE Result Date: 01/04/2024    ECHOCARDIOGRAM REPORT   Patient Name:   KEE DRUDGE Date of Exam: 01/04/2024 Medical Rec #:  [id number]      Height:       69.0 in Accession #:    [id number]     Weight:       181.0 lb Date of Birth:  1966-06-04      BSA:          1.980 m Patient Age:    57 years       BP:           95/74 mmHg Patient Gender: M              HR:           65 bpm. Exam Location:  Inpatient Procedure: 2D Echo, Cardiac Doppler and Color Doppler Indications:    CHF- Acute Systolic  History:        Patient has prior history of Echocardiogram examinations, most                 recent 08/18/2022. COPD; Risk Factors:Hypertension, Diabetes,                 Dyslipidemia and Current Smoker.  Sonographer:    Ozell Free Referring Phys: [id number] SUBRINA SUNDIL  Sonographer Comments: Image acquisition challenging due to patient behavioral factors. and Image acquisition challenging due to respiratory motion. IMPRESSIONS  1. Left ventricular ejection fraction, by estimation, is <20%. The left ventricle has severely  decreased function. The left ventricle demonstrates global hypokinesis. The left ventricular internal cavity size was moderately to severely dilated. There is  mild concentric left ventricular hypertrophy. Left ventricular diastolic parameters are indeterminate.  2. Right ventricular systolic function was not well visualized. The right ventricular size is mildly enlarged. There is moderately elevated pulmonary artery systolic pressure. The estimated right ventricular systolic pressure is 49.5 mmHg.  3. The mitral valve is abnormal. Trivial mitral valve regurgitation. No evidence of mitral stenosis.  4. The aortic valve is tricuspid. Aortic valve regurgitation is not visualized. No aortic stenosis is present. Comparison(s): No significant change from prior study. No significant change from prior. Patient refused apical, subcostal, and suprasternal images. FINDINGS  Left Ventricle: Left ventricular ejection fraction, by estimation, is <20%. The left ventricle has severely decreased function. The left ventricle demonstrates global hypokinesis. The left ventricular internal cavity size was moderately to severely dilated. There is mild concentric left ventricular hypertrophy. Left ventricular diastolic parameters are indeterminate. Right Ventricle: The right ventricular size is mildly enlarged. Right vetricular wall thickness was not well visualized. Right ventricular systolic function was not well visualized. There is moderately elevated pulmonary artery systolic pressure. The tricuspid regurgitant velocity is 3.22 m/s, and with an assumed right atrial pressure of 8 mmHg, the estimated right ventricular systolic pressure is 49.5 mmHg. Left [facility]: Left atrial size was not well visualized. Right [facility]: Right atrial size was not well visualized. Pericardium: There is no evidence of pericardial effusion. Mitral Valve: The mitral valve is abnormal. Trivial mitral valve regurgitation. No evidence of mitral valve stenosis.  Tricuspid Valve: The tricuspid valve is grossly normal. Tricuspid valve regurgitation is mild . No evidence of tricuspid stenosis. Aortic Valve: The aortic valve is tricuspid. Aortic valve regurgitation is not visualized. No aortic stenosis is present. Pulmonic Valve: The pulmonic valve was not well visualized. Pulmonic valve regurgitation is mild. No evidence of pulmonic stenosis. Aorta: The aortic root and ascending aorta are structurally normal,  with no evidence of dilitation. Venous: The inferior vena cava was not well visualized. IAS/Shunts: The interatrial septum was not well visualized.  LEFT VENTRICLE PLAX 2D LVIDd:         6.00 cm LVIDs:         5.40 cm LV PW:         1.10 cm LV IVS:        1.10 cm LVOT diam:     1.80 cm LVOT Area:     2.54 cm  LEFT ATRIUM         Index LA diam:    4.30 cm 2.17 cm/m   AORTA Ao Root diam: 3.20 cm TRICUSPID VALVE TR Peak grad:   41.5 mmHg TR Vmax:        322.00 cm/s  SHUNTS Systemic Diam: 1.80 cm Shelda Bruckner MD Electronically signed by Shelda Bruckner MD Signature Date/Time: 01/04/2024/1:07:14 PM    Final    DG CHEST PORT 1 VIEW Result Date: 01/02/2024 CLINICAL DATA:  Shortness of breath. EXAM: PORTABLE CHEST 1 VIEW COMPARISON:  January 02, 2024 (12:57 p.m.) FINDINGS: There is stable mild to moderate severity cardiac silhouette enlargement. There is no evidence of an acute infiltrate, pleural effusion or pneumothorax. The visualized skeletal structures are unremarkable. IMPRESSION: Stable cardiomegaly without evidence of acute or active cardiopulmonary disease. Electronically Signed   By: Suzen Dials M.D.   On: 01/02/2024 22:38   DG Chest Portable 1 View Result Date: 01/02/2024 CLINICAL DATA:  Shortness of breath. EXAM: PORTABLE CHEST 1 VIEW COMPARISON:  December 20, 2023. FINDINGS: Stable cardiomegaly. Both lungs are clear. The visualized skeletal structures are unremarkable. IMPRESSION: No active disease. Electronically Signed   By: Lynwood Landy Raddle  M.D.   On: 01/02/2024 13:13   VAS US  LOWER EXTREMITY VENOUS (DVT) Result Date: 12/20/2023  Lower Venous DVT Study Patient Name:  ELISE GLADDEN  Date of Exam:   12/20/2023 Medical Rec #: [id number]       Accession #:    [id number] Date of Birth: 1966-03-25       Patient Gender: M Patient Age:   58 years Exam Location[facility]  [facility] Procedure:      VAS US  LOWER EXTREMITY VENOUS (DVT) Referring Phys: TIMOTHY OPYD --------------------------------------------------------------------------------  Indications: Pain.  Risk Factors: PE (07/16/2022) RUE Partial DVT (03/30/22). 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 Comparison Study: Previous exams on 12/07/22, 08/18/22, 03/30/22, 11/07/21, and                   03/02/21 were ALL negative for DVT Performing Technologist: Ezzie Potters RVT, RDMS  Examination Guidelines: A complete evaluation includes B-mode imaging, spectral Doppler, color Doppler, and power Doppler as needed of all accessible portions of each vessel. Bilateral testing is considered an integral part of a complete examination. Limited examinations for reoccurring indications may be performed as noted. The reflux portion of the exam is performed with the patient in reverse Trendelenburg.  +-----+---------------+---------+-----------+----------+--------------+ RIGHTCompressibilityPhasicitySpontaneityPropertiesThrombus Aging +-----+---------------+---------+-----------+----------+--------------+ CFV  Full           Yes      Yes                                 +-----+---------------+---------+-----------+----------+--------------+ pulsatile doppler waveform in CFV  +---------+---------------+---------+-----------+----------+---------------+ LEFT     CompressibilityPhasicitySpontaneityPropertiesThrombus Aging  +---------+---------------+---------+-----------+----------+---------------+ CFV  No       Yes                                   +---------+---------------+---------+-----------+----------+---------------+ SFJ                                                   patent by color +---------+---------------+---------+-----------+----------+---------------+ FV Prox                 No       Yes                                  +---------+---------------+---------+-----------+----------+---------------+ FV Mid                  No       Yes                                  +---------+---------------+---------+-----------+----------+---------------+ FV Distal               No       Yes                                  +---------+---------------+---------+-----------+----------+---------------+ PFV                     No       Yes                                  +---------+---------------+---------+-----------+----------+---------------+ POP      Full           No       Yes                                  +---------+---------------+---------+-----------+----------+---------------+ PTV                                                   BKA             +---------+---------------+---------+-----------+----------+---------------+ PERO                                                  BKA             +---------+---------------+---------+-----------+----------+---------------+ Patient lying on right side making exam very difficult. CFV, SFJ, FV were all patent by color and doppler only. Pulsatile and dimished doppler waveforms throughout.    Summary: RIGHT: - No evidence of common femoral vein obstruction.  LEFT: - There is no evidence of deep vein thrombosis in the lower extremity. However, portions of this examination were limited- see technologist comments above.  - No cystic structure found in  the popliteal fossa.  *See table(s) above for measurements and observations. Electronically signed by Lonni Gaskins MD on 12/20/2023 at 1:47:24 PM.    Final    DG Chest Portable 1 View Result Date:  12/20/2023 CLINICAL DATA:  Shortness of breath EXAM: PORTABLE CHEST 1 VIEW COMPARISON:  02/13/2023 FINDINGS: Cardiopericardial enlargement. Aortic and hilar contours are unremarkable. Interstitial coarsening which is baseline, there is emphysema by recent chest CT. Mild hazy density in the right mid lung is stable from CT. There is no edema, consolidation, effusion, or pneumothorax. IMPRESSION: 1. Stable from recent chest CT. 2. Chronic lung disease and cardiomegaly. Electronically Signed   By: Dorn Roulette M.D.   On: 12/20/2023 04:28    Microbiology: Results for orders placed or performed during the hospital encounter of 01/02/24  Resp panel by RT-PCR (RSV, Flu A&B, Covid) Anterior Nasal Swab     Status: None   Collection Time: 01/02/24 12:33 PM   Specimen: Anterior Nasal Swab  Result Value Ref Range Status   SARS Coronavirus 2 by RT PCR NEGATIVE NEGATIVE Final   Influenza A by PCR NEGATIVE NEGATIVE Final   Influenza B by PCR NEGATIVE NEGATIVE Final    Comment: (NOTE) The Xpert Xpress SARS-CoV-2/FLU/RSV plus assay is intended as an aid in the diagnosis of influenza from Nasopharyngeal swab specimens and should not be used as a sole basis for treatment. Nasal washings and aspirates are unacceptable for Xpert Xpress SARS-CoV-2/FLU/RSV testing.  Fact Sheet for Patients: bloggercourse.com  Fact Sheet for Healthcare Providers: seriousbroker.it  This test is not yet approved or cleared by the [place]  FDA and has been authorized for detection and/or diagnosis of SARS-CoV-2 by FDA under an Emergency Use Authorization (EUA). This EUA will remain in effect (meaning this test can be used) for the duration of the COVID-19 declaration under Section 564(b)(1) of the Act, 21 U.S.C. section 360bbb-3(b)(1), unless the authorization is terminated or revoked.     Resp Syncytial Virus by PCR NEGATIVE NEGATIVE Final    Comment: (NOTE) Fact  Sheet for Patients: bloggercourse.com  Fact Sheet for Healthcare Providers: seriousbroker.it  This test is not yet approved or cleared by the [place]  FDA and has been authorized for detection and/or diagnosis of SARS-CoV-2 by FDA under an Emergency Use Authorization (EUA). This EUA will remain in effect (meaning this test can be used) for the duration of the COVID-19 declaration under Section 564(b)(1) of the Act, 21 U.S.C. section 360bbb-3(b)(1), unless the authorization is terminated or revoked.  Performed at [facility], [address], [place], [name] [address]     Labs: CBC: Recent Labs  Lab 01/02/24 1232 01/02/24 2315 01/03/24 0538 01/04/24 1251  WBC 6.5  --  6.3 13.3*  NEUTROABS 4.0  --   --  12.0*  HGB 12.9* 13.3 13.3 14.2  HCT 39.7 39.0 40.5 43.5  MCV 99.7  --  97.8 97.3  PLT 263  --  278 331   Basic Metabolic Panel: Recent Labs  Lab 01/03/24 0538 01/03/24 1005 01/04/24 1026 01/04/24 1251 01/05/24 0252  NA 133* 135 140 141 139  K 5.7* 5.0 3.8 4.2 3.7  CL 102 102 96* 99 96*  CO2 20* 21* 29 27 29   GLUCOSE 151* 148* 124* 112* 152*  BUN 28* 31* 35* 36* 36*  CREATININE 1.24 1.38* 1.35* 1.49* 1.63*  CALCIUM  8.9 8.9 9.5 9.3 8.7*  MG  --  2.0 1.9  --  1.7  PHOS  --  3.8 3.8 4.1  3.5   Liver Function Tests: Recent Labs  Lab 01/02/24 1232 01/03/24 0538 01/03/24 1005 01/04/24 1026 01/04/24 1251 01/05/24 0252  AST 70* 31  --   --   --   --   ALT 56* 48*  --   --   --   --   ALKPHOS 111 108  --   --   --   --   BILITOT 2.0* 1.5*  --   --   --   --   PROT 6.5 6.7  --   --   --   --   ALBUMIN 3.3* 3.3* 3.1* 3.9 3.4* 3.2*   CBG: No results for input(s): GLUCAP in the last 168 hours.  Discharge time spent: greater than 30 minutes.  Signed: Eric Nunnery, MD [facility] 01/06/2024

## 2024-01-06 NOTE — Progress Notes (Signed)
 Heart Failure Navigator Progress Note  Assessed for Heart & Vascular TOC clinic readiness.  Patient does not meet criteria due to wheel chair bound, see's [facility] cardiology. No HF TOC .   Navigator will sign off at this time.   Stephane Haddock, BSN, Scientist, Clinical (histocompatibility And Immunogenetics) Only

## 2024-01-07 ENCOUNTER — Inpatient Hospital Stay: Payer: MEDICAID | Admitting: Family Medicine

## 2024-01-07 ENCOUNTER — Telehealth: Payer: Self-pay

## 2024-01-07 NOTE — Transitions of Care (Post Inpatient/ED Visit) (Signed)
   01/07/2024  Name: [name] MRN: [id number] DOB: February 04, 1966  Today's TOC FU Call Status: Today's TOC FU Call Status:: Unsuccessful Call (1st Attempt) Unsuccessful Call (1st Attempt) Date: 01/07/24  Attempted to reach the patient regarding the most recent Inpatient/ED visit.  Follow Up Plan: Additional outreach attempts will be made to reach the patient to complete the Transitions of Care (Post Inpatient/ED visit) call.   Signature  Calton Daring, RN

## 2024-01-11 ENCOUNTER — Telehealth: Payer: Self-pay

## 2024-01-11 NOTE — Transitions of Care (Post Inpatient/ED Visit) (Signed)
   01/11/2024  Name: [name] MRN: [id number] DOB: 19-Apr-1966  Today's TOC FU Call Status: Today's TOC FU Call Status:: Unsuccessful Call (2nd Attempt) Unsuccessful Call (1st Attempt) Date: 01/07/24 Unsuccessful Call (2nd Attempt) Date: 01/11/24  Attempted to reach the patient regarding the most recent Inpatient/ED visit.  Follow Up Plan: Additional outreach attempts will be made to reach the patient to complete the Transitions of Care (Post Inpatient/ED visit) call.   Signature  Burnett Carson, RN

## 2024-01-12 ENCOUNTER — Telehealth: Payer: Self-pay

## 2024-01-12 NOTE — Transitions of Care (Post Inpatient/ED Visit) (Signed)
   01/12/2024  Name: [name] MRN: [id number] DOB: 03/16/1966  Today's TOC FU Call Status: Today's TOC FU Call Status:: Unsuccessful Call (3rd Attempt) Unsuccessful Call (1st Attempt) Date: 01/07/24 Unsuccessful Call (2nd Attempt) Date: 01/11/24 Unsuccessful Call (3rd Attempt) Date: 01/12/24  Attempted to reach the patient regarding the most recent Inpatient/ED visit.  Follow Up Plan: No further outreach attempts will be made at this time. We have been unable to contact the patient.  Letter sent to patient requesting he contact [facility] to schedule a hospital follow up appointment as we have not been able to reach him.   Signature Robyne Peers, RN

## 2024-01-13 ENCOUNTER — Inpatient Hospital Stay (HOSPITAL_COMMUNITY)
Admission: EM | Admit: 2024-01-13 | Discharge: 2024-01-18 | DRG: 291 | Disposition: A | Discharge status: Home / Self Care | Payer: MEDICAID | Attending: Internal Medicine | Admitting: Internal Medicine

## 2024-01-13 ENCOUNTER — Emergency Department (HOSPITAL_COMMUNITY): Payer: MEDICAID

## 2024-01-13 ENCOUNTER — Encounter (HOSPITAL_COMMUNITY): Payer: Self-pay | Admitting: Emergency Medicine

## 2024-01-13 ENCOUNTER — Other Ambulatory Visit: Payer: Self-pay

## 2024-01-13 DIAGNOSIS — Z886 Allergy status to analgesic agent status: Secondary | ICD-10-CM

## 2024-01-13 DIAGNOSIS — F259 Schizoaffective disorder, unspecified: Secondary | ICD-10-CM | POA: Diagnosis present

## 2024-01-13 DIAGNOSIS — I110 Hypertensive heart disease with heart failure: Principal | ICD-10-CM | POA: Diagnosis present

## 2024-01-13 DIAGNOSIS — Z813 Family history of other psychoactive substance abuse and dependence: Secondary | ICD-10-CM

## 2024-01-13 DIAGNOSIS — R0602 Shortness of breath: Secondary | ICD-10-CM

## 2024-01-13 DIAGNOSIS — E119 Type 2 diabetes mellitus without complications: Secondary | ICD-10-CM | POA: Diagnosis present

## 2024-01-13 DIAGNOSIS — Z888 Allergy status to other drugs, medicaments and biological substances status: Secondary | ICD-10-CM

## 2024-01-13 DIAGNOSIS — G8929 Other chronic pain: Secondary | ICD-10-CM | POA: Diagnosis present

## 2024-01-13 DIAGNOSIS — I509 Heart failure, unspecified: Secondary | ICD-10-CM

## 2024-01-13 DIAGNOSIS — R079 Chest pain, unspecified: Principal | ICD-10-CM

## 2024-01-13 DIAGNOSIS — R9389 Abnormal findings on diagnostic imaging of other specified body structures: Secondary | ICD-10-CM

## 2024-01-13 DIAGNOSIS — K219 Gastro-esophageal reflux disease without esophagitis: Secondary | ICD-10-CM | POA: Diagnosis present

## 2024-01-13 DIAGNOSIS — J45901 Unspecified asthma with (acute) exacerbation: Secondary | ICD-10-CM | POA: Diagnosis present

## 2024-01-13 DIAGNOSIS — Z89611 Acquired absence of right leg above knee: Secondary | ICD-10-CM

## 2024-01-13 DIAGNOSIS — R9431 Abnormal electrocardiogram [ECG] [EKG]: Secondary | ICD-10-CM | POA: Diagnosis present

## 2024-01-13 DIAGNOSIS — J441 Chronic obstructive pulmonary disease with (acute) exacerbation: Secondary | ICD-10-CM | POA: Diagnosis present

## 2024-01-13 DIAGNOSIS — Z880 Allergy status to penicillin: Secondary | ICD-10-CM

## 2024-01-13 DIAGNOSIS — Z89512 Acquired absence of left leg below knee: Secondary | ICD-10-CM

## 2024-01-13 DIAGNOSIS — F319 Bipolar disorder, unspecified: Secondary | ICD-10-CM | POA: Diagnosis present

## 2024-01-13 DIAGNOSIS — Z91148 Patient's other noncompliance with medication regimen for other reason: Secondary | ICD-10-CM

## 2024-01-13 DIAGNOSIS — F1410 Cocaine abuse, uncomplicated: Secondary | ICD-10-CM | POA: Diagnosis present

## 2024-01-13 DIAGNOSIS — J439 Emphysema, unspecified: Secondary | ICD-10-CM | POA: Diagnosis present

## 2024-01-13 DIAGNOSIS — R7989 Other specified abnormal findings of blood chemistry: Secondary | ICD-10-CM

## 2024-01-13 DIAGNOSIS — I5023 Acute on chronic systolic (congestive) heart failure: Secondary | ICD-10-CM | POA: Diagnosis present

## 2024-01-13 DIAGNOSIS — F17210 Nicotine dependence, cigarettes, uncomplicated: Secondary | ICD-10-CM | POA: Diagnosis present

## 2024-01-13 LAB — COMPREHENSIVE METABOLIC PANEL
ALT: 33 U/L (ref 0–44)
AST: 29 U/L (ref 15–41)
Albumin: 3.3 g/dL — ABNORMAL LOW (ref 3.5–5.0)
Alkaline Phosphatase: 72 U/L (ref 38–126)
Anion gap: 10 (ref 5–15)
BUN: 29 mg/dL — ABNORMAL HIGH (ref 6–20)
CO2: 25 mmol/L (ref 22–32)
Calcium: 8.7 mg/dL — ABNORMAL LOW (ref 8.9–10.3)
Chloride: 105 mmol/L (ref 98–111)
Creatinine, Ser: 1.08 mg/dL (ref 0.61–1.24)
GFR, Estimated: >60 mL/min (ref >60)
Glucose, Bld: 145 mg/dL — ABNORMAL HIGH (ref 70–99)
Potassium: 4.1 mmol/L (ref 3.5–5.1)
Sodium: 140 mmol/L (ref 135–145)
Total Bilirubin: 1.1 mg/dL (ref 0.0–1.2)
Total Protein: 6.4 g/dL — ABNORMAL LOW (ref 6.5–8.1)

## 2024-01-13 LAB — CBC WITH DIFFERENTIAL/PLATELET
Abs Immature Granulocytes: 0.05 10*3/uL (ref 0.00–0.07)
Basophils Absolute: 0 10*3/uL (ref 0.0–0.1)
Basophils Relative: 0 %
Eosinophils Absolute: 0.1 10*3/uL (ref 0.0–0.5)
Eosinophils Relative: 1 %
HCT: 45 % (ref 39.0–52.0)
Hemoglobin: 14.6 g/dL (ref 13.0–17.0)
Immature Granulocytes: 1 %
Lymphocytes Relative: 34 %
Lymphs Abs: 2.4 10*3/uL (ref 0.7–4.0)
MCH: 31.8 pg (ref 26.0–34.0)
MCHC: 32.4 g/dL (ref 30.0–36.0)
MCV: 98 fL (ref 80.0–100.0)
Monocytes Absolute: 0.7 10*3/uL (ref 0.1–1.0)
Monocytes Relative: 10 %
Neutro Abs: 3.9 10*3/uL (ref 1.7–7.7)
Neutrophils Relative %: 54 %
Platelets: 273 10*3/uL (ref 150–400)
RBC: 4.59 MIL/uL (ref 4.22–5.81)
RDW: 15.5 % (ref 11.5–15.5)
WBC: 7.2 10*3/uL (ref 4.0–10.5)
nRBC: 0 % (ref 0.0–0.2)

## 2024-01-13 LAB — BRAIN NATRIURETIC PEPTIDE: B Natriuretic Peptide: 2455.2 pg/mL — ABNORMAL HIGH (ref 0.0–100.0)

## 2024-01-13 LAB — TROPONIN I (HIGH SENSITIVITY)
Troponin I (High Sensitivity): 45 ng/L — ABNORMAL HIGH (ref <18)
Troponin I (High Sensitivity): 40 ng/L — ABNORMAL HIGH (ref <18)

## 2024-01-13 LAB — RESP PANEL BY RT-PCR (RSV, FLU A&B, COVID)  RVPGX2
Influenza A by PCR: NEGATIVE
Influenza B by PCR: NEGATIVE
Resp Syncytial Virus by PCR: NEGATIVE
SARS Coronavirus 2 by RT PCR: NEGATIVE

## 2024-01-13 LAB — I-STAT CG4 LACTIC ACID, ED: Lactic Acid, Venous: 0.9 mmol/L (ref 0.5–1.9)

## 2024-01-13 MED ORDER — ALBUTEROL SULFATE HFA 108 (90 BASE) MCG/ACT IN AERS
2.0000 | INHALATION_SPRAY | RESPIRATORY_TRACT | Status: DC | PRN
  Administered 2024-01-14: 2 via RESPIRATORY_TRACT
  Filled 2024-01-13: qty 6.7

## 2024-01-13 MED ORDER — FENTANYL CITRATE PF 50 MCG/ML IJ SOSY
50.0000 ug | PREFILLED_SYRINGE | Freq: Once | INTRAMUSCULAR | Status: AC
  Administered 2024-01-13: 50 ug via INTRAVENOUS
  Filled 2024-01-13: qty 1

## 2024-01-13 MED ORDER — IOHEXOL 350 MG/ML SOLN
65.0000 mL | Freq: Once | INTRAVENOUS | Status: AC | PRN
  Administered 2024-01-13: 65 mL via INTRAVENOUS

## 2024-01-13 NOTE — ED Notes (Signed)
1st lac in normal range 2nd can be cancelled

## 2024-01-13 NOTE — ED Provider Notes (Signed)
11:13 PM Assumed care from Dr. Rush Landmark, please see their note for full history, physical and decision making until this point. In brief this is a 58 y.o. year old male who presented to the ED tonight with Shortness of Breath and Leg Pain     Here with fluid overload, needs admitted. Also abnormal GB on CT so needs [place] prior to admit.   [place] reassuring. D/w Dr. Maisie Fus for admission for further diuresis.  Labs, studies and imaging reviewed by myself and considered in medical decision making if ordered. Imaging interpreted by radiology.  Labs Reviewed  COMPREHENSIVE METABOLIC PANEL - Abnormal; Notable for the following components:      Result Value   Glucose, Bld 145 (*)    BUN 29 (*)    Calcium 8.7 (*)    Total Protein 6.4 (*)    Albumin 3.3 (*)    All other components within normal limits  BRAIN NATRIURETIC PEPTIDE - Abnormal; Notable for the following components:   B Natriuretic Peptide 2,455.2 (*)    All other components within normal limits  TROPONIN I (HIGH SENSITIVITY) - Abnormal; Notable for the following components:   Troponin I (High Sensitivity) 45 (*)    All other components within normal limits  RESP PANEL BY RT-PCR (RSV, FLU A&B, COVID)  RVPGX2  CBC WITH DIFFERENTIAL/PLATELET  I-STAT CG4 LACTIC ACID, ED  TROPONIN I (HIGH SENSITIVITY)    CT Angio Chest PE W and/or Wo Contrast  Final Result    DG Chest 2 View  Final Result    US Abdomen Limited RUQ (LIVER/GB)    (Results Pending)    No follow-ups on file.    [name], [name] Cower, MD 01/14/24 [phone]

## 2024-01-13 NOTE — ED Provider Notes (Signed)
[place] EMERGENCY DEPARTMENT AT [facility] Provider Note   CSN: [id number] Arrival date & time: 01/13/24  1136     History  Chief Complaint  Patient presents with   Shortness of Breath   Leg Pain    [name] is a 58 y.o. male.  The history is provided by the patient and medical records. No language interpreter was used.  Shortness of Breath Severity:  Severe Onset quality:  Gradual Duration:  3 days Timing:  Constant Progression:  Waxing and waning Chronicity:  Recurrent Context: URI   Relieved by:  Nothing Worsened by:  Deep breathing, coughing and exertion Ineffective treatments:  None tried Associated symptoms: chest pain, cough, sputum production and wheezing   Associated symptoms: no abdominal pain, no fever, no headaches, no neck pain, no rash, no syncope and no vomiting   Leg Pain Associated symptoms: fatigue   Associated symptoms: no back pain, no fever and no neck pain        Home Medications Prior to Admission medications   Medication Sig Start Date End Date Taking? Authorizing Provider  ARIPiprazole (ABILIFY) 5 MG tablet Take 1 tablet (5 mg total) by mouth daily. Patient not taking: Reported on 01/03/2024 12/23/23   Rhetta Mura, MD  Baclofen 5 MG TABS Take 1 tablet (5 mg total) by mouth 3 (three) times daily as needed. 10/28/23   Georganna Skeans, MD  carvedilol (COREG) 12.5 MG tablet Take 1 tablet (12.5 mg total) by mouth 2 (two) times daily with a meal. 01/06/24 02/05/24  Vassie Loll, MD  gabapentin (NEURONTIN) 300 MG capsule Take 2 capsules (600 mg total) by mouth 3 (three) times daily. 01/06/24 04/05/24  Vassie Loll, MD  hydrOXYzine (ATARAX) 10 MG tablet Take 1 tablet (10 mg total) by mouth 3 (three) times daily as needed for anxiety. Patient not taking: Reported on 01/03/2024 12/22/23   Rhetta Mura, MD  isosorbide-hydrALAZINE (BIDIL) 20-37.5 MG tablet Take 1 tablet by mouth 3 (three) times daily. 01/06/24   Vassie Loll, MD   oxyCODONE (OXYCONTIN) 10 mg 12 hr tablet Take 1 tablet (10 mg total) by mouth every 12 (twelve) hours. 01/06/24   Vassie Loll, MD  oxyCODONE (ROXICODONE) 5 MG immediate release tablet Take 1 tablet (5 mg total) by mouth every 8 (eight) hours as needed for moderate pain (pain score 4-6) or breakthrough pain. 01/06/24 01/05/25  Vassie Loll, MD  pantoprazole (PROTONIX) 40 MG tablet Take 1 tablet (40 mg total) by mouth daily. 01/07/24   Vassie Loll, MD  torsemide (DEMADEX) 20 MG tablet Take 2 tablets (40 mg total) by mouth daily. 01/07/24   Vassie Loll, MD  VENTOLIN HFA 108 [phone] Base) MCG/ACT inhaler INHALE TWO PUFFS INTO THE LUNGS EVERY 6 (SIX) HOURS AS NEEDED FOR WHEEZING OR SHORTNESS OF BREATH Patient not taking: Reported on 01/03/2024 12/18/23   Georganna Skeans, MD      Allergies    Acetaminophen, Motrin [ibuprofen], Nsaids, Lyrica cr [pregabalin er], and Peanut-containing drug products    Review of Systems   Review of Systems  Constitutional:  Positive for fatigue. Negative for chills and fever.  HENT:  Negative for congestion.   Respiratory:  Positive for cough, sputum production, chest tightness, shortness of breath and wheezing.   Cardiovascular:  Positive for chest pain and leg swelling (per pt). Negative for palpitations and syncope.  Gastrointestinal:  Negative for abdominal pain, constipation, diarrhea, nausea and vomiting.  Genitourinary:  Negative for dysuria and flank pain.  Musculoskeletal:  Negative for  back pain, neck pain and neck stiffness.  Skin:  Negative for rash and wound.  Neurological:  Negative for headaches.  Psychiatric/Behavioral:  Negative for agitation and confusion.   All other systems reviewed and are negative.   Physical Exam Updated Vital Signs BP (!) 153/96   Pulse (!) 113   Temp 97.9 F (36.6 C) (Oral)   Resp 20   Ht 5\' 9"  (1.753 m)   Wt 74.8 kg   SpO2 100%   BMI 24.37 kg/m  Physical Exam Vitals and nursing note reviewed.  Constitutional:       General: He is not in acute distress.    Appearance: He is well-developed. He is not ill-appearing, toxic-appearing or diaphoretic.  HENT:     Head: Normocephalic and atraumatic.  Eyes:     Extraocular Movements: Extraocular movements intact.     Conjunctiva/sclera: Conjunctivae normal.     Pupils: Pupils are equal, round, and reactive to light.  Cardiovascular:     Rate and Rhythm: Regular rhythm. Tachycardia present.     Heart sounds: No murmur heard. Pulmonary:     Effort: Pulmonary effort is normal. No tachypnea or respiratory distress.     Breath sounds: Wheezing, rhonchi and rales present.  Chest:     Chest wall: Tenderness present.  Abdominal:     Palpations: Abdomen is soft.     Tenderness: There is no abdominal tenderness.  Musculoskeletal:        General: No swelling.     Cervical back: Neck supple.     Left lower leg: Tenderness present.  Skin:    General: Skin is warm and dry.     Capillary Refill: Capillary refill takes less than 2 seconds.  Neurological:     General: No focal deficit present.     Mental Status: He is alert.  Psychiatric:        Mood and Affect: Mood normal.     ED Results / Procedures / Treatments   Labs (all labs ordered are listed, but only abnormal results are displayed) Labs Reviewed  COMPREHENSIVE METABOLIC PANEL - Abnormal; Notable for the following components:      Result Value   Glucose, Bld 145 (*)    BUN 29 (*)    Calcium 8.7 (*)    Total Protein 6.4 (*)    Albumin 3.3 (*)    All other components within normal limits  BRAIN NATRIURETIC PEPTIDE - Abnormal; Notable for the following components:   B Natriuretic Peptide 2,455.2 (*)    All other components within normal limits  TROPONIN I (HIGH SENSITIVITY) - Abnormal; Notable for the following components:   Troponin I (High Sensitivity) 45 (*)    All other components within normal limits  RESP PANEL BY RT-PCR (RSV, FLU A&B, COVID)  RVPGX2  CBC WITH DIFFERENTIAL/PLATELET   I-STAT CG4 LACTIC ACID, ED  I-STAT CG4 LACTIC ACID, ED  TROPONIN I (HIGH SENSITIVITY)    EKG EKG Interpretation Date/Time:  Wednesday January 13 2024 12:07:46 EST Ventricular Rate:  110 PR Interval:  174 QRS Duration:  92 QT Interval:  362 QTC Calculation: 489 R Axis:   -37  Text Interpretation: Sinus tachycardia Right atrial enlargement Left axis deviation Nonspecific ST and T wave abnormality Abnormal ECG When compared with ECG of 04-Jan-2024 12:11, PREVIOUS ECG IS PRESENT when compared to prior, faster rate. No STEMI Confirmed by Theda Belfast ([id number]) on 01/13/2024 6:37:06 PM  Radiology CT Angio Chest PE W and/or Wo Contrast Result Date: 01/13/2024  CLINICAL DATA:  Pulmonary embolism suspected, high probability. Pleuritic chest pain, shortness of breath, and tachycardia. EXAM: CT ANGIOGRAPHY CHEST WITH CONTRAST TECHNIQUE: Multidetector CT imaging of the chest was performed using the standard protocol during bolus administration of intravenous contrast. Multiplanar CT image reconstructions and MIPs were obtained to evaluate the vascular anatomy. RADIATION DOSE REDUCTION: This exam was performed according to the departmental dose-optimization program which includes automated exposure control, adjustment of the mA and/or kV according to patient size and/or use of iterative reconstruction technique. CONTRAST:  65mL OMNIPAQUE IOHEXOL 350 MG/ML SOLN COMPARISON:  12/07/2022. FINDINGS: Cardiovascular: The heart is enlarged and there is a trace pericardial effusion. Three-vessel coronary artery calcifications are noted. Is atherosclerotic calcification of the aorta without evidence of aneurysm. The pulmonary trunk is borderline distended. No definite evidence of pulmonary embolism. Mediastinum/Nodes: No mediastinal or axillary lymphadenopathy. Prominent hilar lymph nodes are noted bilaterally. The thyroid gland, trachea, and esophagus are within normal limits. Lungs/Pleura: Centrilobular  emphysematous changes are present in the lungs. Bronchial wall thickening is present bilaterally. Stable scattered atelectasis or scarring is noted in the right lung. No effusion or pneumothorax is seen. Upper Abdomen: There suspected gallbladder wall thickening versus pericholecystic edema. Musculoskeletal: Degenerative changes are noted in the thoracolumbar spine. No acute osseous abnormality. Review of the MIP images confirms the above findings. IMPRESSION: 1. No evidence of pulmonary embolism or other acute intrathoracic process 2. Stable atelectasis or scarring in the right lung. 3. Emphysema. 4. Cardiomegaly with coronary artery calcifications. 5. Aortic atherosclerosis. 6. Suspected gallbladder wall thickening versus pericholecystic edema. Ultrasound is suggested for further evaluation. Electronically Signed   By: Thornell Sartorius M.D.   On: 01/13/2024 22:11   DG Chest 2 View Result Date: 01/13/2024 CLINICAL DATA:  Shortness of breath.  History of amputations. EXAM: CHEST - 2 VIEW COMPARISON:  01/02/2024. FINDINGS: Low lung volume. Mild pulmonary vascular congestion. No frank pulmonary edema. Redemonstration of approximately 2 x 2.5 cm opacity overlying the right lower lung zone, laterally, abutting the minor fissure, which is grossly similar to the prior studies and corresponds to peripheral opacity/atelectasis on the prior CT scan from 12/07/2022. No new dense consolidation or lung collapse. Bilateral costophrenic angles are clear. Note is made of elevated right hemidiaphragm. Stable mildly enlarged cardio-mediastinal silhouette. No acute osseous abnormalities. The soft tissues are within normal limits. IMPRESSION: *No acute cardiopulmonary disease. *Mild pulmonary vascular congestion.  No frank pulmonary edema. *Stable faint opacity overlying the right lower lung zone, laterally, abutting the minor fissure, as discussed above. Electronically Signed   By: Jules Schick M.D.   On: 01/13/2024 15:18     Procedures Procedures    Medications Ordered in ED Medications  albuterol (VENTOLIN HFA) 108 (90 Base) MCG/ACT inhaler 2 puff (has no administration in time range)  fentaNYL (SUBLIMAZE) injection 50 mcg (has no administration in time range)  fentaNYL (SUBLIMAZE) injection 50 mcg (50 mcg Intravenous Given 01/13/24 1946)  iohexol (OMNIPAQUE) 350 MG/ML injection 65 mL (65 mLs Intravenous Contrast Given 01/13/24 2152)    ED Course/ Medical Decision Making/ A&P                                 Medical Decision Making Amount and/or Complexity of Data Reviewed Labs: ordered. Radiology: ordered.  Risk Prescription drug management.    [name] is a 58 y.o. male with a past medical history significant for asthma, CHF, COPD on 2 L home oxygen  at baseline, previous leg amputations, hyperlipidemia, previous stroke, diabetes, previous DVT not on anticoagulation, and recent admission for hypoxic respiratory failure last week who presents with several days of worsened pleuritic chest pain, exertional shortness of breath, productive cough, and fatigue.  [facility]ney patient, he was discharged about a week ago and had done well for several days but then started to get worse.  He reports exertional shortness of breath and chest discomfort feels like a tightness in his right chest.  He reports it hurts take a deep breath.  He reports pain and swelling in his left leg that usually happens when he gets fluid overloaded.  He has been taking his medications and diuretic he reports.  Denies any trauma.  Reports no fevers or chills but does have a cough with some phlegm.  Denies any other complaints at this time.  No nausea, vomiting, diaphoresis, abdominal pain, constipation, diarrhea, or urinary changes.  On exam, lungs had some wheezing, rhonchi, and rales.  Chest was tender to palpation.  Abdomen nontender.  Patient has amputations of the legs and had some tenderness in his left leg that he reports is  more chronic.  EKG does not show STEMI.  Patient had x-ray in triage that showed similar to prior however patient is reporting worsened breathing and the discomfort.  Will get screening labs, with his reported history of blood clots without blood thinner use and his pleuritic chest pain shortness breath and tachycardia, will get imaging to rule out pulmonary embolism.  Will give him some pain medicine.  Anticipate reassessment after workup to determine disposition.  10:28 PM CT scan does not show pulmonary bolus him but does show possible acute cholecystitis with gallbladder wall thickening and pericholecystic edema.  Ultrasound recommended.  Will get ultrasound to make sure he does not need cholecystectomy however he will need admission for fluid overload and worsening shortness of breath and chest tightness.  BNP is much more elevated than prior.  He will need admission after ultrasound assuming he does not need a surgical consultation.  Okey Regal be transferred to oncoming team to await results of ultrasound and reassessment.         Final Clinical Impression(s) / ED Diagnoses Final diagnoses:  Right-sided chest pain  Shortness of breath  Elevated brain natriuretic peptide (BNP) level  Abnormal CT of the chest    Clinical Impression: 1. Right-sided chest pain   2. Shortness of breath   3. Elevated brain natriuretic peptide (BNP) level   4. Abnormal CT of the chest     Disposition: Awaiting results of ultrasound to rule out cholecystitis then admission to hospital for fluid overload recurrence with shortness of breath and chest discomfort.  This note was prepared with assistance of Conservation officer, historic buildings. Occasional wrong-word or sound-a-like substitutions may have occurred due to the inherent limitations of voice recognition software.     Romanda Turrubiates, Canary Brim, MD 01/13/24 [phone]

## 2024-01-13 NOTE — ED Notes (Signed)
Pt states that after this last stick is is fed up and doesn't want to be stuck anymore. He states he will started causing a problem if we keep trying to stick him

## 2024-01-13 NOTE — ED Triage Notes (Signed)
Pt via [facility] from home c/o SOB and left leg pain at amputation site. Pt has left BKA and right AKA. Pt refused vitals en route.

## 2024-01-14 ENCOUNTER — Inpatient Hospital Stay (HOSPITAL_COMMUNITY): Payer: MEDICAID

## 2024-01-14 DIAGNOSIS — F259 Schizoaffective disorder, unspecified: Secondary | ICD-10-CM | POA: Diagnosis present

## 2024-01-14 DIAGNOSIS — J439 Emphysema, unspecified: Secondary | ICD-10-CM | POA: Diagnosis present

## 2024-01-14 DIAGNOSIS — R0602 Shortness of breath: Secondary | ICD-10-CM | POA: Diagnosis present

## 2024-01-14 DIAGNOSIS — E119 Type 2 diabetes mellitus without complications: Secondary | ICD-10-CM | POA: Diagnosis present

## 2024-01-14 DIAGNOSIS — R9431 Abnormal electrocardiogram [ECG] [EKG]: Secondary | ICD-10-CM | POA: Diagnosis present

## 2024-01-14 DIAGNOSIS — Z89611 Acquired absence of right leg above knee: Secondary | ICD-10-CM | POA: Diagnosis not present

## 2024-01-14 DIAGNOSIS — Z888 Allergy status to other drugs, medicaments and biological substances status: Secondary | ICD-10-CM | POA: Diagnosis not present

## 2024-01-14 DIAGNOSIS — J441 Chronic obstructive pulmonary disease with (acute) exacerbation: Secondary | ICD-10-CM | POA: Diagnosis present

## 2024-01-14 DIAGNOSIS — Z880 Allergy status to penicillin: Secondary | ICD-10-CM | POA: Diagnosis not present

## 2024-01-14 DIAGNOSIS — Z91148 Patient's other noncompliance with medication regimen for other reason: Secondary | ICD-10-CM | POA: Diagnosis not present

## 2024-01-14 DIAGNOSIS — I5023 Acute on chronic systolic (congestive) heart failure: Secondary | ICD-10-CM | POA: Diagnosis present

## 2024-01-14 DIAGNOSIS — Z89512 Acquired absence of left leg below knee: Secondary | ICD-10-CM | POA: Diagnosis not present

## 2024-01-14 DIAGNOSIS — F1410 Cocaine abuse, uncomplicated: Secondary | ICD-10-CM | POA: Diagnosis present

## 2024-01-14 DIAGNOSIS — I5021 Acute systolic (congestive) heart failure: Secondary | ICD-10-CM | POA: Diagnosis not present

## 2024-01-14 DIAGNOSIS — F319 Bipolar disorder, unspecified: Secondary | ICD-10-CM | POA: Diagnosis present

## 2024-01-14 DIAGNOSIS — G8929 Other chronic pain: Secondary | ICD-10-CM | POA: Diagnosis present

## 2024-01-14 DIAGNOSIS — J45901 Unspecified asthma with (acute) exacerbation: Secondary | ICD-10-CM | POA: Diagnosis present

## 2024-01-14 DIAGNOSIS — I110 Hypertensive heart disease with heart failure: Secondary | ICD-10-CM | POA: Diagnosis present

## 2024-01-14 DIAGNOSIS — K219 Gastro-esophageal reflux disease without esophagitis: Secondary | ICD-10-CM | POA: Diagnosis present

## 2024-01-14 DIAGNOSIS — Z813 Family history of other psychoactive substance abuse and dependence: Secondary | ICD-10-CM | POA: Diagnosis not present

## 2024-01-14 DIAGNOSIS — F17210 Nicotine dependence, cigarettes, uncomplicated: Secondary | ICD-10-CM | POA: Diagnosis present

## 2024-01-14 DIAGNOSIS — Z886 Allergy status to analgesic agent status: Secondary | ICD-10-CM | POA: Diagnosis not present

## 2024-01-14 LAB — COMPREHENSIVE METABOLIC PANEL
ALT: 30 U/L (ref 0–44)
AST: 22 U/L (ref 15–41)
Albumin: 3.1 g/dL — ABNORMAL LOW (ref 3.5–5.0)
Alkaline Phosphatase: 65 U/L (ref 38–126)
Anion gap: 11 (ref 5–15)
BUN: 24 mg/dL — ABNORMAL HIGH (ref 6–20)
CO2: 21 mmol/L — ABNORMAL LOW (ref 22–32)
Calcium: 8.5 mg/dL — ABNORMAL LOW (ref 8.9–10.3)
Chloride: 107 mmol/L (ref 98–111)
Creatinine, Ser: 1 mg/dL (ref 0.61–1.24)
GFR, Estimated: >60 mL/min (ref >60)
Glucose, Bld: 109 mg/dL — ABNORMAL HIGH (ref 70–99)
Potassium: 4.1 mmol/L (ref 3.5–5.1)
Sodium: 139 mmol/L (ref 135–145)
Total Bilirubin: 0.8 mg/dL (ref 0.0–1.2)
Total Protein: 6 g/dL — ABNORMAL LOW (ref 6.5–8.1)

## 2024-01-14 LAB — CBC
HCT: 42.3 % (ref 39.0–52.0)
Hemoglobin: 13.9 g/dL (ref 13.0–17.0)
MCH: 32.3 pg (ref 26.0–34.0)
MCHC: 32.9 g/dL (ref 30.0–36.0)
MCV: 98.1 fL (ref 80.0–100.0)
Platelets: 258 10*3/uL (ref 150–400)
RBC: 4.31 MIL/uL (ref 4.22–5.81)
RDW: 15.3 % (ref 11.5–15.5)
WBC: 6.1 10*3/uL (ref 4.0–10.5)
nRBC: 0 % (ref 0.0–0.2)

## 2024-01-14 LAB — ECHOCARDIOGRAM LIMITED
Est EF: <20
Height: 69 in
S' Lateral: 5.6 cm
Single Plane A4C EF: 11.1 %
Weight: 2640 [oz_av]

## 2024-01-14 LAB — PHOSPHORUS: Phosphorus: 4.3 mg/dL (ref 2.5–4.6)

## 2024-01-14 LAB — MAGNESIUM: Magnesium: 2 mg/dL (ref 1.7–2.4)

## 2024-01-14 LAB — RAPID URINE DRUG SCREEN, HOSP PERFORMED
Amphetamines: NOT DETECTED
Barbiturates: NOT DETECTED
Benzodiazepines: NOT DETECTED
Cocaine: POSITIVE — AB
Opiates: NOT DETECTED
Tetrahydrocannabinol: NOT DETECTED

## 2024-01-14 LAB — CREATININE, SERUM
Creatinine, Ser: 1.01 mg/dL (ref 0.61–1.24)
GFR, Estimated: >60 mL/min (ref >60)

## 2024-01-14 LAB — ETHANOL: Alcohol, Ethyl (B): <10 mg/dL (ref <10)

## 2024-01-14 MED ORDER — OXYCODONE HCL 5 MG PO TABS
5.0000 mg | ORAL_TABLET | Freq: Four times a day (QID) | ORAL | Status: DC | PRN
  Administered 2024-01-14 – 2024-01-18 (×16): 5 mg via ORAL
  Filled 2024-01-14 (×16): qty 1

## 2024-01-14 MED ORDER — NICOTINE 21 MG/24HR TD PT24: 21.0000 mg | MEDICATED_PATCH | Freq: Every day | TRANSDERMAL | Status: DC | PRN

## 2024-01-14 MED ORDER — GABAPENTIN 300 MG PO CAPS
600.0000 mg | ORAL_CAPSULE | Freq: Three times a day (TID) | ORAL | Status: DC
  Administered 2024-01-14 – 2024-01-18 (×12): 600 mg via ORAL
  Filled 2024-01-14 (×13): qty 2

## 2024-01-14 MED ORDER — OXYCODONE HCL ER 10 MG PO T12A: 10.0000 mg | EXTENDED_RELEASE_TABLET | Freq: Two times a day (BID) | ORAL | Status: DC

## 2024-01-14 MED ORDER — ARIPIPRAZOLE 5 MG PO TABS
5.0000 mg | ORAL_TABLET | Freq: Every day | ORAL | Status: DC
  Administered 2024-01-14 – 2024-01-18 (×5): 5 mg via ORAL
  Filled 2024-01-14 (×5): qty 1

## 2024-01-14 MED ORDER — FUROSEMIDE 10 MG/ML IJ SOLN
40.0000 mg | Freq: Two times a day (BID) | INTRAMUSCULAR | Status: DC
  Administered 2024-01-14 – 2024-01-17 (×6): 40 mg via INTRAVENOUS
  Filled 2024-01-14 (×8): qty 4

## 2024-01-14 MED ORDER — CARVEDILOL 12.5 MG PO TABS
12.5000 mg | ORAL_TABLET | Freq: Two times a day (BID) | ORAL | Status: DC
  Administered 2024-01-14 – 2024-01-18 (×8): 12.5 mg via ORAL
  Filled 2024-01-14 (×9): qty 1

## 2024-01-14 MED ORDER — ISOSORB DINITRATE-HYDRALAZINE 20-37.5 MG PO TABS
1.0000 | ORAL_TABLET | Freq: Three times a day (TID) | ORAL | Status: DC
  Administered 2024-01-14 – 2024-01-18 (×12): 1 via ORAL
  Filled 2024-01-14 (×13): qty 1

## 2024-01-14 MED ORDER — FUROSEMIDE 10 MG/ML IJ SOLN
40.0000 mg | Freq: Once | INTRAMUSCULAR | Status: AC
  Administered 2024-01-14: 40 mg via INTRAVENOUS
  Filled 2024-01-14: qty 4

## 2024-01-14 MED ORDER — OXYCODONE HCL 5 MG PO TABS
5.0000 mg | ORAL_TABLET | Freq: Three times a day (TID) | ORAL | Status: DC | PRN
  Administered 2024-01-14: 5 mg via ORAL
  Filled 2024-01-14: qty 1

## 2024-01-14 MED ORDER — PANTOPRAZOLE SODIUM 40 MG PO TBEC
40.0000 mg | DELAYED_RELEASE_TABLET | Freq: Every day | ORAL | Status: DC
  Administered 2024-01-14 – 2024-01-18 (×5): 40 mg via ORAL
  Filled 2024-01-14 (×5): qty 1

## 2024-01-14 MED ORDER — GABAPENTIN 300 MG PO CAPS: 600.0000 mg | ORAL_CAPSULE | Freq: Three times a day (TID) | ORAL | Status: DC

## 2024-01-14 MED ORDER — ENOXAPARIN SODIUM 40 MG/0.4ML IJ SOSY
40.0000 mg | PREFILLED_SYRINGE | INTRAMUSCULAR | Status: DC
  Administered 2024-01-16 – 2024-01-17 (×2): 40 mg via SUBCUTANEOUS
  Filled 2024-01-14 (×3): qty 0.4

## 2024-01-14 NOTE — Progress Notes (Signed)
Heart Failure Navigator Progress Note  Assessed for Heart & Vascular TOC clinic readiness.  Patient follows with [facility]. No HF TOC. .   Navigator will sign off at this time.   Rhae Hammock, BSN, Scientist, clinical (histocompatibility and immunogenetics) Only

## 2024-01-14 NOTE — ED Notes (Signed)
This RN attempted to start IV and collect the labs. Pt states " do you even know what your doing. I been here all night and you aint done shit for me, why do it now? Do you ever know what your doing. Will you jsut hurry up." Pt then jerks his arm away and states " just get away, you dont know what your doing so you aint touching me." Pt educated on the importance of having IV access and having the labs collected. Pt continued to refuse.

## 2024-01-14 NOTE — Progress Notes (Addendum)
Patient has belongings in security box at the desk. Cigarettes, lighter and key with black key chain was placed in bag with label.

## 2024-01-14 NOTE — ED Notes (Signed)
Pt refused Nt from taking his vitals.

## 2024-01-14 NOTE — Progress Notes (Signed)
  Echocardiogram 2D Echocardiogram has been performed.  Leda Roys RDCS 01/14/2024, 3:12 PM

## 2024-01-14 NOTE — H&P (Signed)
History and Physical    [name] UEA:[id number] DOB: 1966-03-23 DOA: 01/13/2024  PCP: Georganna Skeans, MD  Patient coming from: home  I have personally briefly reviewed patient's old medical records in [facility]  Chief Complaint: sob ,as well as  left leg pain  HPI: [name] is a 58 y.o. male with medical history significant of  Asthma, COPD, GERD, HTN , prolonged QT, Depression, Schizoaffective d/o, CHFref, , DMII, s/p b/l lower extremity amputee, chronic pain , polysubstance abuse  who presents to ED BIBEMS with complaint of sob as well as left leg pain.  Patient notes has continued to have SOB/DOE- he states he has O2 at home. He notes after initial set up his oxygen he noted difficulty with using equipment but now notes his compliance at home. IN reference to his medications he states he is compliant with the medications that he currently has at home. Although he admits that he may note be taking the correct regimen. He notes he is confused about his medications. He currently noted continued shortness of breath. He denies fever/chills/ n/v/d/ or abdominal pain or chest pain .  ED Course:  Vitals: afeb, bp 156/119, hr 110 repeat 155/109, rre 17 sat 100% on  2L  EKG: snr  RAE, LAD nonspecific st-t wave changes  Labs: Wbc 7.2, hgb 14.6, plt 273 BNP 2455.2 Na 140, K 4.1, Cl 105, glu 145, cr 1.08 CE45 BNP 2,455 up from 1785 RVP:neg CE 40,45  RUQ u/s MPRESSION: 1. Trace amount of pericholecystic fluid without evidence of cholelithiasis or acute cholecystitis. 2. Hepatic steatosis. Tx fentanyl  50 mcg,lasix 40 mg   Cxr IMPRESSION: *No acute cardiopulmonary disease. *Mild pulmonary vascular congestion.  No frank pulmonary edema. *Stable faint opacity overlying the right lower lung zone, laterally, abutting the minor fissure, as discussed above.  CT IMPRESSION: 1. No evidence of pulmonary embolism or other acute intrathoracic process 2. Stable atelectasis or  scarring in the right lung. 3. Emphysema. 4. Cardiomegaly with coronary artery calcifications. 5. Aortic atherosclerosis. 6. Suspected gallbladder wall thickening versus pericholecystic edema. Ultrasound is suggested for further evaluation.  Review of Systems: As per HPI otherwise 10 point review of systems negative.   Past Medical History:  Diagnosis Date   Asthma    Bipolar 1 disorder (HCC)    CHF (congestive heart failure) (HCC)    COPD (chronic obstructive pulmonary disease) (HCC)    Depression    Diabetes mellitus without complication (HCC)    GERD (gastroesophageal reflux disease)    Homelessness    Hypertension    QT prolongation    Schizoaffective disorder (HCC)     Past Surgical History:  Procedure Laterality Date   AMPUTATION Left 12/10/2022   Procedure: LEFT BELOW KNEE AMPUTATION;  Surgeon: Nadara Mustard, MD;  Location: [facility] OR;  Service: Orthopedics;  Laterality: Left;   AMPUTATION TOE Bilateral 01/02/2021   Procedure: AMPUTATION TOE;  Surgeon: Park Liter, DPM;  Location: [facility] OR;  Service: Podiatry;  Laterality: Bilateral;  Ok to leave patient on hospital bed   IR ANGIO INTRA EXTRACRAN SEL COM CAROTID INNOMINATE BILAT MOD SED  12/17/2020   IR ANGIO VERTEBRAL SEL VERTEBRAL BILAT MOD SED  12/17/2020   MULTIPLE EXTRACTIONS WITH ALVEOLOPLASTY N/A 03/25/2023   Procedure: IRRIGATION AND DEBRIDEMENT, MULTIPLE EXTRACTION WITH ALVEOLOPLASTY X 4 , EXTRACTIONS 4 THROUGH 13, 20 THROUGHT 29;  Surgeon: Vivia Ewing, DMD;  Location: [facility] OR;  Service: Oral Surgery;  Laterality: N/A;   RADIOLOGY WITH ANESTHESIA  N/A 12/17/2020   Procedure: IR WITH ANESTHESIA;  Surgeon: Radiologist, Medication, MD;  Location: [facility] OR;  Service: Radiology;  Laterality: N/A;     reports that he has been smoking cigarettes. He has never used smokeless tobacco. He reports current alcohol use. He reports current drug use. Drug: Cocaine.  Allergies  Allergen Reactions   Acetaminophen Hives, Nausea And  Vomiting, Rash and Anaphylaxis    PT states that if he takes this he is going to the hospital   Motrin [Ibuprofen] Anaphylaxis   Nsaids Anaphylaxis    PT states that if he takes this he is going to the hospital   Lyrica Cr [Pregabalin Er] Other (See Comments)    Dystonia Dysarthria    Peanut-Containing Drug Products Other (See Comments)    Pt denies allergy to peanut butter 03/02/2021    Family History  Problem Relation Age of Onset   Drug abuse Mother    Suicidality Mother     Prior to Admission medications   Medication Sig Start Date End Date Taking? Authorizing Provider  ARIPiprazole (ABILIFY) 5 MG tablet Take 1 tablet (5 mg total) by mouth daily. Patient not taking: Reported on 01/03/2024 12/23/23   Rhetta Mura, MD  carvedilol (COREG) 12.5 MG tablet Take 1 tablet (12.5 mg total) by mouth 2 (two) times daily with a meal. Patient not taking: Reported on 01/14/2024 01/06/24 02/05/24  Vassie Loll, MD  gabapentin (NEURONTIN) 300 MG capsule Take 2 capsules (600 mg total) by mouth 3 (three) times daily. Patient not taking: Reported on 01/14/2024 01/06/24 04/05/24  Vassie Loll, MD  hydrOXYzine (ATARAX) 10 MG tablet Take 1 tablet (10 mg total) by mouth 3 (three) times daily as needed for anxiety. Patient not taking: Reported on 01/03/2024 12/22/23   Rhetta Mura, MD  isosorbide-hydrALAZINE (BIDIL) 20-37.5 MG tablet Take 1 tablet by mouth 3 (three) times daily. Patient not taking: Reported on 01/14/2024 01/06/24   Vassie Loll, MD  oxyCODONE (OXYCONTIN) 10 mg 12 hr tablet Take 1 tablet (10 mg total) by mouth every 12 (twelve) hours. Patient not taking: Reported on 01/14/2024 01/06/24   Vassie Loll, MD  oxyCODONE (ROXICODONE) 5 MG immediate release tablet Take 1 tablet (5 mg total) by mouth every 8 (eight) hours as needed for moderate pain (pain score 4-6) or breakthrough pain. Patient not taking: Reported on 01/14/2024 01/06/24 01/05/25  Vassie Loll, MD  pantoprazole (PROTONIX) 40 MG  tablet Take 1 tablet (40 mg total) by mouth daily. Patient not taking: Reported on 01/14/2024 01/07/24   Vassie Loll, MD  torsemide (DEMADEX) 20 MG tablet Take 2 tablets (40 mg total) by mouth daily. Patient not taking: Reported on 01/14/2024 01/07/24   Vassie Loll, MD  VENTOLIN HFA 108 [phone] Base) MCG/ACT inhaler INHALE TWO PUFFS INTO THE LUNGS EVERY 6 (SIX) HOURS AS NEEDED FOR WHEEZING OR SHORTNESS OF BREATH Patient not taking: No sig reported 12/18/23   Georganna Skeans, MD    Physical Exam: Vitals:   01/13/24 1159 01/13/24 1510 01/13/24 1919 01/13/24 2301  BP: (!) 156/119 (!) 153/96 (!) 155/109 121/84  Pulse: (!) 110 (!) 113 (!) 115 89  Resp: 17 20 19 20   Temp: 97.9 F (36.6 C)  98.2 F (36.8 C) 98.4 F (36.9 C)  TempSrc: Oral  Oral Oral  SpO2: 100% 100% 95% 100%  Weight:      Height:        Constitutional: NAD, calm, comfortable Vitals:   01/13/24 1159 01/13/24 1510 01/13/24 1919 01/13/24 2301  BP: Marland Kitchen)  156/119 (!) 153/96 (!) 155/109 121/84  Pulse: (!) 110 (!) 113 (!) 115 89  Resp: 17 20 19 20   Temp: 97.9 F (36.6 C)  98.2 F (36.8 C) 98.4 F (36.9 C)  TempSrc: Oral  Oral Oral  SpO2: 100% 100% 95% 100%  Weight:      Height:       Eyes: PERRL, lids and conjunctivae normal ENMT: Mucous membranes are moist. Posterior pharynx clear of any exudate or lesions.Normal dentition.  Neck: normal, supple, no masses, no thyromegaly Respiratory: clear to auscultation bilaterally, no wheezing, no crackles. Normal respiratory effort. No accessory muscle use.  Cardiovascular: Regular rate and rhythm, no murmurs / rubs / gallops. No extremity edema. 2+ pedal pulses.  Abdomen: no tenderness, no masses palpated. No hepatosplenomegaly. Bowel sounds positive.  Musculoskeletal: no clubbing / cyanosis. B/l lower ext amputee,otherwise Good ROM, no contractures. Normal muscle tone.  Skin: no rashes, lesions, ulcers. No induration Neurologic: CN 2-12 grossly intact. Sensation intact, . Strength  5/5 in all 4.  Psychiatric: Normal judgment and insight. Alert and oriented x 3. Normal mood.    Labs on Admission: I have personally reviewed following labs and imaging studies  CBC: Recent Labs  Lab 01/13/24 1936  WBC 7.2  NEUTROABS 3.9  HGB 14.6  HCT 45.0  MCV 98.0  PLT 273   Basic Metabolic Panel: Recent Labs  Lab 01/13/24 1936  NA 140  K 4.1  CL 105  CO2 25  GLUCOSE 145*  BUN 29*  CREATININE 1.08  CALCIUM 8.7*   GFR: Estimated Creatinine Clearance: 77.3 mL/min (by C-G formula based on SCr of 1.08 mg/dL). Liver Function Tests: Recent Labs  Lab 01/13/24 1936  AST 29  ALT 33  ALKPHOS 72  BILITOT 1.1  PROT 6.4*  ALBUMIN 3.3*   No results for input(s): "LIPASE", "AMYLASE" in the last 168 hours. No results for input(s): "AMMONIA" in the last 168 hours. Coagulation Profile: No results for input(s): "INR", "PROTIME" in the last 168 hours. Cardiac Enzymes: No results for input(s): "CKTOTAL", "CKMB", "CKMBINDEX", "TROPONINI" in the last 168 hours. BNP (last 3 results) No results for input(s): "PROBNP" in the last 8760 hours. HbA1C: No results for input(s): "HGBA1C" in the last 72 hours. CBG: No results for input(s): "GLUCAP" in the last 168 hours. Lipid Profile: No results for input(s): "CHOL", "HDL", "LDLCALC", "TRIG", "CHOLHDL", "LDLDIRECT" in the last 72 hours. Thyroid Function Tests: No results for input(s): "TSH", "T4TOTAL", "FREET4", "T3FREE", "THYROIDAB" in the last 72 hours. Anemia Panel: No results for input(s): "VITAMINB12", "FOLATE", "FERRITIN", "TIBC", "IRON", "RETICCTPCT" in the last 72 hours. Urine analysis:    Component Value Date/Time   COLORURINE YELLOW 01/02/2024 1454   APPEARANCEUR CLEAR 01/02/2024 1454   LABSPEC >1.030 (H) 01/02/2024 1454   PHURINE 5.5 01/02/2024 1454   GLUCOSEU NEGATIVE 01/02/2024 1454   HGBUR NEGATIVE 01/02/2024 1454   BILIRUBINUR SMALL (A) 01/02/2024 1454   KETONESUR NEGATIVE 01/02/2024 1454   PROTEINUR 100 (A)  01/02/2024 1454   NITRITE NEGATIVE 01/02/2024 1454   LEUKOCYTESUR NEGATIVE 01/02/2024 1454    Radiological Exams on Admission: US Abdomen Limited RUQ (LIVER/GB) Result Date: 01/13/2024 CLINICAL DATA:  Follow-up findings suspicious for cholecystitis on prior chest CT. EXAM: ULTRASOUND ABDOMEN LIMITED RIGHT UPPER QUADRANT COMPARISON:  None Available. FINDINGS: Gallbladder: The gallbladder is partially contracted, without evidence of gallstones. The gallbladder wall measures 3.5 mm in thickness. A trace amount of pericholecystic fluid is seen. No sonographic Murphy sign noted by sonographer. Common bile duct: Diameter:  2.4 mm Liver: No focal lesion identified. Diffusely increased echogenicity of the liver parenchyma is noted. Portal vein is patent on color Doppler imaging with normal direction of blood flow towards the liver. Other: Limited study secondary to rapid patient breathing, as per the ultrasound technologist. IMPRESSION: 1. Trace amount of pericholecystic fluid without evidence of cholelithiasis or acute cholecystitis. 2. Hepatic steatosis. Electronically Signed   By: Aram Candela M.D.   On: 01/13/2024 23:52   CT Angio Chest PE W and/or Wo Contrast Result Date: 01/13/2024 CLINICAL DATA:  Pulmonary embolism suspected, high probability. Pleuritic chest pain, shortness of breath, and tachycardia. EXAM: CT ANGIOGRAPHY CHEST WITH CONTRAST TECHNIQUE: Multidetector CT imaging of the chest was performed using the standard protocol during bolus administration of intravenous contrast. Multiplanar CT image reconstructions and MIPs were obtained to evaluate the vascular anatomy. RADIATION DOSE REDUCTION: This exam was performed according to the departmental dose-optimization program which includes automated exposure control, adjustment of the mA and/or kV according to patient size and/or use of iterative reconstruction technique. CONTRAST:  65mL OMNIPAQUE IOHEXOL 350 MG/ML SOLN COMPARISON:  12/07/2022.  FINDINGS: Cardiovascular: The heart is enlarged and there is a trace pericardial effusion. Three-vessel coronary artery calcifications are noted. Is atherosclerotic calcification of the aorta without evidence of aneurysm. The pulmonary trunk is borderline distended. No definite evidence of pulmonary embolism. Mediastinum/Nodes: No mediastinal or axillary lymphadenopathy. Prominent hilar lymph nodes are noted bilaterally. The thyroid gland, trachea, and esophagus are within normal limits. Lungs/Pleura: Centrilobular emphysematous changes are present in the lungs. Bronchial wall thickening is present bilaterally. Stable scattered atelectasis or scarring is noted in the right lung. No effusion or pneumothorax is seen. Upper Abdomen: There suspected gallbladder wall thickening versus pericholecystic edema. Musculoskeletal: Degenerative changes are noted in the thoracolumbar spine. No acute osseous abnormality. Review of the MIP images confirms the above findings. IMPRESSION: 1. No evidence of pulmonary embolism or other acute intrathoracic process 2. Stable atelectasis or scarring in the right lung. 3. Emphysema. 4. Cardiomegaly with coronary artery calcifications. 5. Aortic atherosclerosis. 6. Suspected gallbladder wall thickening versus pericholecystic edema. Ultrasound is suggested for further evaluation. Electronically Signed   By: Thornell Sartorius M.D.   On: 01/13/2024 22:11   DG Chest 2 View Result Date: 01/13/2024 CLINICAL DATA:  Shortness of breath.  History of amputations. EXAM: CHEST - 2 VIEW COMPARISON:  01/02/2024. FINDINGS: Low lung volume. Mild pulmonary vascular congestion. No frank pulmonary edema. Redemonstration of approximately 2 x 2.5 cm opacity overlying the right lower lung zone, laterally, abutting the minor fissure, which is grossly similar to the prior studies and corresponds to peripheral opacity/atelectasis on the prior CT scan from 12/07/2022. No new dense consolidation or lung collapse.  Bilateral costophrenic angles are clear. Note is made of elevated right hemidiaphragm. Stable mildly enlarged cardio-mediastinal silhouette. No acute osseous abnormalities. The soft tissues are within normal limits. IMPRESSION: *No acute cardiopulmonary disease. *Mild pulmonary vascular congestion.  No frank pulmonary edema. *Stable faint opacity overlying the right lower lung zone, laterally, abutting the minor fissure, as discussed above. Electronically Signed   By: Jules Schick M.D.   On: 01/13/2024 15:18    EKG: Independently reviewed.   Assessment/Plan  Acute CHF exacerbation with rEF -ef <20% -currently on bipap in ed , will wean as able  -lasix 40 mg iv bid  -ekg without hyperacute st -twave changes  -resume carvedilol and bidil  -cardiology consult in am   Asthma/ COPD -no acute exacerbation  - resume home controller medications -  nebs prn  GERD -ppi   HTN, uncontrolled admit  - patient has not been complaint with his medications  -resume home regimen   Depression Schizoaffective d/o  DMII -Most recent A1c 5.8 -monitor fs  poc   B/l lower extremity amputee Left leg pain  Chronic pain  -continue the use of gabapentin, oxycodone and muscle relaxants -need to f/u with pain clinic as outpatient   Polysubstance abuse   -patient denies use    DVT prophylaxis: lovenox Code Status: full/ as discussed per patient wishes in event of cardiac arrest  Family Communication: none at bedside Disposition Plan: patient  expected to be admitted greater than 2 midnights  Consults called: n/a Admission status: progressive care    Lurline Del MD [facility]   If 7PM-7AM, please contact night-coverage [email or web address] Password [email or web address]  01/14/2024, 1:51 AM

## 2024-01-14 NOTE — Progress Notes (Signed)
PROGRESS NOTE  [name] QMV:[id number] DOB: 02-Sep-1966   PCP: Georganna Skeans, MD  Patient is from: Home.  DOA: 01/13/2024 LOS: 0  Chief complaints Chief Complaint  Patient presents with   Shortness of Breath   Leg Pain     Brief Narrative / Interim history: 58 year old M with PMH of asthma, COPD, HFrEF, schizoaffective disorder, depression, prolonged QT, polysubstance use (tobacco, alcohol and cocaine), left BKA, right AKA and noncompliance with meds brought to ED by EMS due to shortness of breath and left leg pain, and admitted with working diagnosis of CHF exacerbation.  BNP elevated to 2400s.  CXR with some pulmonary vascular congestion but no overt pulmonary edema.  CT angio chest negative for PE but emphysema.  Patient was started on IV Lasix.   Subjective: Seen and examined earlier this morning.  No major events overnight of this morning.  Continues to endorse shortness of breath and left stump pain.  Also reports some cough but no chest pain.  Denies GI or UTI symptoms.  Not happy when asked about smoking, alcohol or substance use.  Objective: Vitals:   01/13/24 2301 01/14/24 0500 01/14/24 0750 01/14/24 1125  BP: 121/84 130/87 (!) 137/110 95/65  Pulse: 89 91 92 72  Resp: 20 18 (!) 24 (!) 23  Temp: 98.4 F (36.9 C) 98.2 F (36.8 C) 98.3 F (36.8 C) 97.9 F (36.6 C)  TempSrc: Oral  Oral Oral  SpO2: 100% 99% 100% 100%  Weight:      Height:        Examination:  GENERAL: No apparent distress.  Nontoxic. HEENT: MMM.  Vision and hearing grossly intact.  NECK: Supple.  No apparent JVD.  RESP: Notable work of breathing as he breathes.  Fair aeration bilaterally. CVS:  RRR. Heart sounds normal.  ABD/GI/GU: BS+. Abd soft, NTND.  MSK/EXT:  Moves extremities.  Left BKA.  Right AKA.  Some dry scab on left BKA stump.  Some tenderness but no erythema or increased warmth to touch. SKIN: As above. NEURO: Awake, alert and oriented appropriately.  No apparent focal neuro  deficit. PSYCH: Calm. Normal affect.   Procedures:  None  Microbiology summarized: COVID-19, influenza and RSV PCR nonreactive  Assessment and plan: Acute on chronic HFrEF: Likely due to noncompliance with medication and ongoing polysubstance use including cocaine.  TTE on 2/3 with LVEF <20%, GH, indeterminate DD, and RVSP of 49.5.  UDS positive for cocaine.  BNP elevated to 2004 100s.  Imaging with some pulmonary vascular congestion but no overt edema.  Still with work of breathing especially when he speaks. -Increase IV Lasix to 40 mg twice daily. -Continue home Coreg, BiDil -Strict intake and output, daily weight, renal functions and electrolytes. -Encouraged to stop smoking and using cocaine.   Asthma/ COPD/chronic hypoxic RF: On 2 L at baseline.  Appears to be stable on home 2 L but notable work of breathing likely due to CHF.  Only on albuterol at home. -Albuterol inhaler as needed  Uncontrolled HTN: Elevated BP on arrival probably due to not taking meds.  Improved. -Cardiac meds and diuretics as above  Left BKA pain: Has dry scab and some tenderness but no erythema or increased warmth to touch.  Does not take gabapentin.  Seems to go home with some prescription for oxycodone every time he is hospitalized.  -Check x-ray -Resume home gabapentin -Discontinue OxyContin -P.o. oxycodone 5 mg every 6 hours.  Will try to wean of this before discharge.   Polysubstance use: Reports  smoking cigarette but does not want to quantify.  Not forthcoming with alcohol or other substance use.  UDS positive for cocaine. -Encouraged to refrain from smoking -Nicotine patch as needed   Depression/schizoaffective disorder: Supposed to be on Abilify but not taking. -Restart Abilify   GERD -Continue PPI     Body mass index is 24.37 kg/m.           DVT prophylaxis:  enoxaparin (LOVENOX) injection 40 mg Start: 01/14/24 1400  Code Status: Full code Family Communication: None at  bedside Level of care: Telemetry Cardiac Status is: Inpatient Remains inpatient appropriate because: Acute CHF   Final disposition: Home Consultants:  None  55 minutes with more than 50% spent in reviewing records, counseling patient/family and coordinating care.   Sch Meds:  Scheduled Meds:  ARIPiprazole  5 mg Oral Daily   carvedilol  12.5 mg Oral BID WC   enoxaparin (LOVENOX) injection  40 mg Subcutaneous Q24H   furosemide  40 mg Intravenous Q12H   gabapentin  600 mg Oral TID   isosorbide-hydrALAZINE  1 tablet Oral TID   pantoprazole  40 mg Oral Daily   Continuous Infusions: PRN Meds:.albuterol, nicotine, oxyCODONE  Antimicrobials: Anti-infectives (From admission, onward)    None        I have personally reviewed the following labs and images: CBC: Recent Labs  Lab 01/13/24 1936 01/14/24 0732  WBC 7.2 6.1  NEUTROABS 3.9  --   HGB 14.6 13.9  HCT 45.0 42.3  MCV 98.0 98.1  PLT 273 258   BMP &GFR Recent Labs  Lab 01/13/24 1936 01/14/24 0732  NA 140 139  K 4.1 4.1  CL 105 107  CO2 25 21*  GLUCOSE 145* 109*  BUN 29* 24*  CREATININE 1.08 1.00  1.01  CALCIUM 8.7* 8.5*  MG  --  2.0  PHOS  --  4.3   Estimated Creatinine Clearance: 82.6 mL/min (by C-G formula based on SCr of 1.01 mg/dL). Liver & Pancreas: Recent Labs  Lab 01/13/24 1936 01/14/24 0732  AST 29 22  ALT 33 30  ALKPHOS 72 65  BILITOT 1.1 0.8  PROT 6.4* 6.0*  ALBUMIN 3.3* 3.1*   No results for input(s): "LIPASE", "AMYLASE" in the last 168 hours. No results for input(s): "AMMONIA" in the last 168 hours. Diabetic: No results for input(s): "HGBA1C" in the last 72 hours. No results for input(s): "GLUCAP" in the last 168 hours. Cardiac Enzymes: No results for input(s): "CKTOTAL", "CKMB", "CKMBINDEX", "TROPONINI" in the last 168 hours. No results for input(s): "PROBNP" in the last 8760 hours. Coagulation Profile: No results for input(s): "INR", "PROTIME" in the last 168 hours. Thyroid  Function Tests: No results for input(s): "TSH", "T4TOTAL", "FREET4", "T3FREE", "THYROIDAB" in the last 72 hours. Lipid Profile: No results for input(s): "CHOL", "HDL", "LDLCALC", "TRIG", "CHOLHDL", "LDLDIRECT" in the last 72 hours. Anemia Panel: No results for input(s): "VITAMINB12", "FOLATE", "FERRITIN", "TIBC", "IRON", "RETICCTPCT" in the last 72 hours. Urine analysis:    Component Value Date/Time   COLORURINE YELLOW 01/02/2024 1454   APPEARANCEUR CLEAR 01/02/2024 1454   LABSPEC >1.030 (H) 01/02/2024 1454   PHURINE 5.5 01/02/2024 1454   GLUCOSEU NEGATIVE 01/02/2024 1454   HGBUR NEGATIVE 01/02/2024 1454   BILIRUBINUR SMALL (A) 01/02/2024 1454   KETONESUR NEGATIVE 01/02/2024 1454   PROTEINUR 100 (A) 01/02/2024 1454   NITRITE NEGATIVE 01/02/2024 1454   LEUKOCYTESUR NEGATIVE 01/02/2024 1454   Sepsis Labs: Invalid input(s): "PROCALCITONIN", "LACTICIDVEN"  Microbiology: Recent Results (from the past 240  hours)  Resp panel by RT-PCR (RSV, Flu A&B, Covid) Anterior Nasal Swab     Status: None   Collection Time: 01/13/24  7:38 PM   Specimen: Anterior Nasal Swab  Result Value Ref Range Status   SARS Coronavirus 2 by RT PCR NEGATIVE NEGATIVE Final   Influenza A by PCR NEGATIVE NEGATIVE Final   Influenza B by PCR NEGATIVE NEGATIVE Final    Comment: (NOTE) The Xpert Xpress SARS-CoV-2/FLU/RSV plus assay is intended as an aid in the diagnosis of influenza from Nasopharyngeal swab specimens and should not be used as a sole basis for treatment. Nasal washings and aspirates are unacceptable for Xpert Xpress SARS-CoV-2/FLU/RSV testing.  Fact Sheet for Patients: BloggerCourse.com  Fact Sheet for Healthcare Providers: SeriousBroker.it  This test is not yet approved or cleared by the [place] FDA and has been authorized for detection and/or diagnosis of SARS-CoV-2 by FDA under an Emergency Use Authorization (EUA). This EUA will  remain in effect (meaning this test can be used) for the duration of the COVID-19 declaration under Section 564(b)(1) of the Act, 21 U.S.C. section 360bbb-3(b)(1), unless the authorization is terminated or revoked.     Resp Syncytial Virus by PCR NEGATIVE NEGATIVE Final    Comment: (NOTE) Fact Sheet for Patients: BloggerCourse.com  Fact Sheet for Healthcare Providers: SeriousBroker.it  This test is not yet approved or cleared by the [place] FDA and has been authorized for detection and/or diagnosis of SARS-CoV-2 by FDA under an Emergency Use Authorization (EUA). This EUA will remain in effect (meaning this test can be used) for the duration of the COVID-19 declaration under Section 564(b)(1) of the Act, 21 U.S.C. section 360bbb-3(b)(1), unless the authorization is terminated or revoked.  Performed at [facility], [address] [place], [name] [address]     Radiology Studies: US Abdomen Limited RUQ (LIVER/GB) Result Date: 01/13/2024 CLINICAL DATA:  Follow-up findings suspicious for cholecystitis on prior chest CT. EXAM: ULTRASOUND ABDOMEN LIMITED RIGHT UPPER QUADRANT COMPARISON:  None Available. FINDINGS: Gallbladder: The gallbladder is partially contracted, without evidence of gallstones. The gallbladder wall measures 3.5 mm in thickness. A trace amount of pericholecystic fluid is seen. No sonographic Murphy sign noted by sonographer. Common bile duct: Diameter: 2.4 mm Liver: No focal lesion identified. Diffusely increased echogenicity of the liver parenchyma is noted. Portal vein is patent on color Doppler imaging with normal direction of blood flow towards the liver. Other: Limited study secondary to rapid patient breathing, as per the ultrasound technologist. IMPRESSION: 1. Trace amount of pericholecystic fluid without evidence of cholelithiasis or acute cholecystitis. 2. Hepatic steatosis. Electronically Signed   By:  Aram Candela M.D.   On: 01/13/2024 23:52   CT Angio Chest PE W and/or Wo Contrast Result Date: 01/13/2024 CLINICAL DATA:  Pulmonary embolism suspected, high probability. Pleuritic chest pain, shortness of breath, and tachycardia. EXAM: CT ANGIOGRAPHY CHEST WITH CONTRAST TECHNIQUE: Multidetector CT imaging of the chest was performed using the standard protocol during bolus administration of intravenous contrast. Multiplanar CT image reconstructions and MIPs were obtained to evaluate the vascular anatomy. RADIATION DOSE REDUCTION: This exam was performed according to the departmental dose-optimization program which includes automated exposure control, adjustment of the mA and/or kV according to patient size and/or use of iterative reconstruction technique. CONTRAST:  65mL OMNIPAQUE IOHEXOL 350 MG/ML SOLN COMPARISON:  12/07/2022. FINDINGS: Cardiovascular: The heart is enlarged and there is a trace pericardial effusion. Three-vessel coronary artery calcifications are noted. Is atherosclerotic calcification of the aorta without evidence  of aneurysm. The pulmonary trunk is borderline distended. No definite evidence of pulmonary embolism. Mediastinum/Nodes: No mediastinal or axillary lymphadenopathy. Prominent hilar lymph nodes are noted bilaterally. The thyroid gland, trachea, and esophagus are within normal limits. Lungs/Pleura: Centrilobular emphysematous changes are present in the lungs. Bronchial wall thickening is present bilaterally. Stable scattered atelectasis or scarring is noted in the right lung. No effusion or pneumothorax is seen. Upper Abdomen: There suspected gallbladder wall thickening versus pericholecystic edema. Musculoskeletal: Degenerative changes are noted in the thoracolumbar spine. No acute osseous abnormality. Review of the MIP images confirms the above findings. IMPRESSION: 1. No evidence of pulmonary embolism or other acute intrathoracic process 2. Stable atelectasis or scarring in the  right lung. 3. Emphysema. 4. Cardiomegaly with coronary artery calcifications. 5. Aortic atherosclerosis. 6. Suspected gallbladder wall thickening versus pericholecystic edema. Ultrasound is suggested for further evaluation. Electronically Signed   By: Thornell Sartorius M.D.   On: 01/13/2024 22:11   DG Chest 2 View Result Date: 01/13/2024 CLINICAL DATA:  Shortness of breath.  History of amputations. EXAM: CHEST - 2 VIEW COMPARISON:  01/02/2024. FINDINGS: Low lung volume. Mild pulmonary vascular congestion. No frank pulmonary edema. Redemonstration of approximately 2 x 2.5 cm opacity overlying the right lower lung zone, laterally, abutting the minor fissure, which is grossly similar to the prior studies and corresponds to peripheral opacity/atelectasis on the prior CT scan from 12/07/2022. No new dense consolidation or lung collapse. Bilateral costophrenic angles are clear. Note is made of elevated right hemidiaphragm. Stable mildly enlarged cardio-mediastinal silhouette. No acute osseous abnormalities. The soft tissues are within normal limits. IMPRESSION: *No acute cardiopulmonary disease. *Mild pulmonary vascular congestion.  No frank pulmonary edema. *Stable faint opacity overlying the right lower lung zone, laterally, abutting the minor fissure, as discussed above. Electronically Signed   By: Jules Schick M.D.   On: 01/13/2024 15:18      Earnestine Shipp T. Lener Ventresca [facility]  If 7PM-7AM, please contact night-coverage [email or web address] 01/14/2024, 11:42 AM

## 2024-01-14 NOTE — ED Notes (Signed)
Patient flagging multiple staff members down, demanding sandwiches, sodas, and graham crackers.  Patient educated on diet and fluid restrictions and verbalized understanding but states that "he's about to starve."

## 2024-01-14 NOTE — TOC CM/SW Note (Signed)
Transition of Care [facility]) - Inpatient Brief Assessment   Patient Details  Name: [name] MRN: [id number] Date of Birth: 03/09/1966  Transition of Care [facility]) CM/SW Contact:    Michaela Corner, LCSWA Phone Number: 01/14/2024, 4:05 PM   Clinical Narrative: [facility] familiar with pt and substance use hx. CSW added substance use resources to pts AVS.   TOC will continue to follow as needed.   Transition of Care Asessment: Insurance and Status: Insurance coverage has been reviewed Patient has primary care physician: Yes Home environment has been reviewed: Home Prior level of function:: W/C bound Prior/Current Home Services: Current home services (PCS services) Social Drivers of Health Review: SDOH reviewed no interventions necessary Readmission risk has been reviewed: Yes Transition of care needs: transition of care needs identified, TOC will continue to follow

## 2024-01-14 NOTE — Plan of Care (Signed)
  Problem: Nutrition: Goal: Adequate nutrition will be maintained Outcome: Progressing   Problem: Safety: Goal: Ability to remain free from injury will improve Outcome: Progressing   Problem: Pain Managment: Goal: General experience of comfort will improve and/or be controlled Outcome: Progressing

## 2024-01-14 NOTE — Discharge Instructions (Signed)
In a time of Crisis:   - [facility]   Mobile Crisis Management provides immediate crisis response, 24/7.   Call [phone]   [facility] for MH/DD/SA Services?Access Center is available 24 hours a day, 7 days a week. Customer Service Specialists will assist you to find a crisis provider that is well-matched with your needs. Your local number is:?[phone]   - [facility]/Behavioral Health Urgent [facility])  OP, individual counseling, medication management  [address] [place], [name] [address]  ; [phone]  Call for intake hours; Medicaid and Uninsured     Outpatient Providers    [facility] ([facility])  Group and individual counseling.  [address]   [place], [name] [address]  [phone]  [place]: [phone]  [place]: [phone]  Medicaid and uninsured.     [facility]  Offers IOP groups multiple times per week.  [address] [place], [name] [address]  [phone]  Takes Medicaid and other insurances.     [facility]   Chemical Dependency Intensive Outpatient Program (IOP)  [address]  [place], [name] [address]  [phone]  Takes Nurse, learning disability and [place].     [facility]   IOP and Partial Hospitalization Program   [address]   [place], [name] [address]  [phone]  Private Insurance, [place] only for partial hospitalization    [facility]  [address], [address], [place], [name] [address]  [phone]  Monday-Friday. Short and Long term options.  [facility] Urgent Care ([facility])  IOP, individual counseling, medication management  [address] [place], [name] [address]  [phone]  Medicaid and [facility]    [facility]  [address]   [place], [name] [address]  [phone]  Private Insurance and Self Pay      [facility]  [address]   [place], [name] [address]  [phone]  Private Insurance, [place], and Self Pay     [facility]   [address] [place], [name] [address]  [facility]   [address]   [place], [name] [address]  [phone]    [facility]   [address]  [place], [name] [address]  [phone]          Residential Treatment Programs    [facility]  [address]  [place], [name] [address]  [phone] or [phone]  Detox and Residential Rehab 21 days  (Medicaid, private insurance, and self pay. If Medicare, will look into funding). No methadone. Call for pre-screen.     [facility])  [address]   [place], [name] [address]  [phone]  Detox 3-7 days (self Pay and Medicaid Limited availability). Transitional Program for females needs 60 days clean first.   Rehab Only for Males (Medicare, Medicaid, and Self Pay)-No methadone.    [facility] [address]  [address]  [place], [name] [address]  [phone] or [phone]  Private Insurance only    [facility]  PHONE:?[phone]  FAX:?[phone]  Residential program for women 21 and over for up to a year through a Christian 12-step recovery model.  Self-pay.      [facility]  [address]  [place], [name]  [address]  Phone: ?[phone]  Must be detoxed 72 hours prior to admission; 28 day program.   Self-pay.    [facility]  [address]   [place], [name]  [phone]  [employer], Medicare, [place] (not straight [place]). They offer assistance with transportation.     [facility]  [address] [place],   [place], [name] [address]  [phone]  Christian Based Program. Men only.  No insurance    [facility] is a substance use disorder treatment program for women,  including those who are pregnant, parenting, and/or whose lives have been touched by abuse and violence.  ([phone]) [phone]    [facility]  [address] [place], [name] [address]  Women's: [phone]  Men's: [phone]  No Medicaid.     [facility] [place]  Locations across the [place] (mainly [place]) willing to help with transportation.   [phone] [employer].  [facility]   [address] [place]   [place], [name] [address]  [phone]  Treatment Only, must make assessment appointment, and must be sober for assessment appointment. Self pay, [facility], must be [facility] resident. No methadone.     [facility]   [address]  [place], [name] [address]  [phone]  No pending legal charges, Long-term work program. No methadone. Call for assessment.    [facility]   [address]  [place], [name] [address]  [phone] or [phone]  Commercial Insurance Only    [facility]  Local - [phone]  [phone]  Private Insurance (no [place]).  Males/Females, call to make referrals, multiple facilities.     [facility]  [address],   [place], [name] [address]  ?([place]  Men Only Upfront Fee  [facility]  Women's Program: [facility]  [address]  [place], [name] [address]  [phone]    [facility]-no methadone:  [facility]  [address]  [place], [name] [address]  [phone] (f)    [facility]  [address] [place], [name] [address]  [phone] [facility]  [phone]  [place], [name]  For women, houses 8 residents for sober living. No Medicaid.                Syringe Services Program   Due to COVID-19, syringe services programs are likely operating under different hours with limited or no  fixed site hours. Some programs may not be operating at all. Please contact the program directly using the phone numbers provided below to see if they are still operating under COVID-19.   [facility] Solution to the Opioid Problem (GCSTOP)  Fixed; mobile; peer-based  [place]  [phone]  [email or web address]   Fixed site exchange at [facility], [address]  [place], [name] [address] on Wednesdays (2:00 - 5:00 pm) and Thursdays (4:00 - 8:00 pm).  Pop-up mobile exchange locations:  [employer], [address] [place], [name] [address] on Tuesdays (11:00 am - 1:00 pm) and Fridays (11:00 am - 1:00 pm)   -[facility]?-?[address], [place], [name] [address] on Tuesdays (2:00 - 4:00 pm) and Fridays (2:00 - 4:00 pm)   -[facility]?- also serves [address] [place] and [employer]   [employer]  Fixed; mobile; peer-based  [place]  [phone]  [email or web address]  [address] [place], [name] [address]  Delivery and outreach available in [place] and [place], please call for more information. Monday, Tuesday: 1:00 -7:00 pm  Thursday: 4:00 pm - 8:00 pm  Friday: 1:00 pm - 8:00 pm)

## 2024-01-14 NOTE — ED Notes (Signed)
Pt left with transport in no new onset distress at this time.

## 2024-01-15 DIAGNOSIS — I5023 Acute on chronic systolic (congestive) heart failure: Secondary | ICD-10-CM | POA: Diagnosis not present

## 2024-01-15 MED ORDER — BUDESONIDE 0.25 MG/2ML IN SUSP
0.2500 mg | Freq: Two times a day (BID) | RESPIRATORY_TRACT | Status: DC
  Administered 2024-01-15 – 2024-01-18 (×6): 0.25 mg via RESPIRATORY_TRACT
  Filled 2024-01-15 (×6): qty 2

## 2024-01-15 MED ORDER — PREDNISONE 20 MG PO TABS
40.0000 mg | ORAL_TABLET | Freq: Every day | ORAL | Status: DC
  Administered 2024-01-15: 40 mg via ORAL
  Filled 2024-01-15: qty 2

## 2024-01-15 MED ORDER — IPRATROPIUM-ALBUTEROL 0.5-2.5 (3) MG/3ML IN SOLN
3.0000 mL | Freq: Four times a day (QID) | RESPIRATORY_TRACT | Status: DC
  Administered 2024-01-15 – 2024-01-16 (×3): 3 mL via RESPIRATORY_TRACT
  Filled 2024-01-15 (×4): qty 3

## 2024-01-15 NOTE — TOC Progression Note (Signed)
Transition of Care [facility]) - Progression Note    Patient Details  Name: [name] MRN: [id number] Date of Birth: 1966/10/10  Transition of Care [facility]) CM/SW Contact  Michaela Corner, [place] Phone Number: 01/15/2024, 2:39 PM  Clinical Narrative:   Pt requested to speak with CSW. Pt expressed wanting to transfer to [employer]. CSW notifed MD.   CSW provided substance use education to pt. Pt states he is aware that he needs to stop using substances but that he needs the doctor to address his medical needs first.   TOC will continue to follow as needed.   Expected Discharge Plan: Home/Self Care Barriers to Discharge: Continued Medical Work up  Expected Discharge Plan and Services       Living arrangements for the past 2 months: Apartment                   DME Agency: NA                   Social Determinants of Health (SDOH) Interventions SDOH Screenings   Food Insecurity: Patient Declined (01/14/2024)  Housing: Patient Declined (01/14/2024)  Transportation Needs: Patient Declined (01/14/2024)  Utilities: Patient Declined (01/14/2024)  Alcohol Screen: Medium Risk (06/24/2019)  Depression (PHQ2-9): Medium Risk (07/28/2023)  Financial Resource Strain: High Risk (07/24/2021)   [facility]  Social Connections: Unknown (04/05/2022)   Received from [facility], [facility]  Stress: Stress Concern Present (07/24/2021)   Received from [facility], [facility]  Tobacco Use: High Risk (01/13/2024)    Readmission Risk Interventions    12/21/2023    9:25 AM 12/09/2022    9:02 AM 12/01/2022    3:56 PM  Readmission Risk Prevention Plan  Transportation Screening Complete Complete Complete  HRI or Home Care Consult Complete    Social Work Consult for Recovery Care Planning/Counseling Complete    Palliative Care Screening Not Applicable    Medication Review Oceanographer) Complete Complete Complete  PCP or Specialist appointment within 3-5  days of discharge  Complete Complete  HRI or Home Care Consult  Complete Complete  SW Recovery Care/Counseling Consult  Complete Complete  Palliative Care Screening  Not Applicable Not Applicable  Skilled Nursing Facility  Not Applicable Not Applicable  SNF Comments   n/a

## 2024-01-15 NOTE — Plan of Care (Signed)
?  Problem: Education: ?Goal: Knowledge of General Education information will improve ?Description: Including pain rating scale, medication(s)/side effects and non-pharmacologic comfort measures ?Outcome: Progressing ?  ?Problem: Clinical Measurements: ?Goal: Will remain free from infection ?Outcome: Progressing ?  ?Problem: Clinical Measurements: ?Goal: Diagnostic test results will improve ?Outcome: Progressing ?  ?Problem: Clinical Measurements: ?Goal: Respiratory complications will improve ?Outcome: Progressing ?  ?Problem: Clinical Measurements: ?Goal: Cardiovascular complication will be avoided ?Outcome: Progressing ?  ?

## 2024-01-15 NOTE — Progress Notes (Signed)
Mobility Specialist Progress Note:   01/15/24 0950  Mobility  Activity  (bed mobility)  Level of Assistance Modified independent, requires aide device or extra time  Assistive Device None  Range of Motion/Exercises Active  Activity Response Tolerated well  Mobility Referral Yes  Mobility visit 1 Mobility  Mobility Specialist Start Time (ACUTE ONLY) 0950  Mobility Specialist Stop Time (ACUTE ONLY) 1000  Mobility Specialist Time Calculation (min) (ACUTE ONLY) 10 min   Pt's personal w/c not here, HH aide present. Pt able to perform bed mobility with no assistance. Continues to c/o SOB and L stump pain. SpO2 99% on RA. Pt left with all needs met (except requesting [place] sandwich).  [name] Mobility Specialist Please contact via SecureChat or  Rehab office at [phone]

## 2024-01-15 NOTE — Progress Notes (Signed)
Pt requesting New MD or Transfer to [facility]. MD and Charge Nurse notified.

## 2024-01-15 NOTE — Progress Notes (Addendum)
   01/15/24 1724  Vitals  BP (!) 57/43  MAP (mmHg) (!) 49  ECG Heart Rate 84  MEWS COLOR  MEWS Score Color Yellow  Oxygen Therapy  SpO2 98 %  O2 Device Room Air  MEWS Score  MEWS Temp 0  MEWS Systolic 3  MEWS Pulse 0  MEWS RR 0  MEWS LOC 0  MEWS Score 3   Left arm x1, Patient moving and talking the whole time. Asked patient to allow me to readjust cuff and have him sit still to try again. Patient Refused. Patient educated on importance of an accurate number and patient still refuses as well as refusing all meds due at this time. Charge Nurse notified. Patient sitting up in bed eating dinner A/Ox4, does not appear to be in any distress. Will reattempt after he eats and continue to monitor. MD notified too.     1819: reattempted per MD request. Patient states he is okay and that he will see [place] in the morning. Pt educated and still sent this RN out.

## 2024-01-15 NOTE — Progress Notes (Signed)
PROGRESS NOTE    [name]  NWG:[id number] DOB: 02/18/1966 DOA: 01/13/2024 PCP: Georganna Skeans, MD  Outpatient Specialists:     Brief Narrative:  Patient is a 58 year old African-American male, with past medical history significant for Asthma, COPD, GERD, HTN , prolonged QT, Depression, Schizoaffective d/o, CHFref, , DMII, s/p b/l lower extremity amputee, chronic pain, and polysubstance abuse.  Patient was recently discharged from the hospital.  Patient was readmitted with shortness of breath/acute on chronic CHF with reduced EF (EF of less than 20%), asthma/COPD and uncontrolled hypertension.   Assessment & Plan:   Active Problems:   CHF exacerbation (HCC)   Acute CHF exacerbation with rEF -ef <20% -currently on bipap in ed , will wean as able  -lasix 40 mg iv bid  -ekg without hyperacute st -twave changes  -resume carvedilol and bidil  -cardiology consult in am  01/15/2024: Continue IV Lasix.  Low threshold to consult the cardiology team.   Asthma/ COPD -no acute exacerbation  - resume home controller medications -nebs prn   GERD -ppi   Hypertension: Uncontrolled:   -Blood pressure control is improving. -Continue Coreg 12.5 Mg p.o. twice daily. -Continue to monitor closely. -Goal blood pressure should be less than 130/80 mmHg.     Depression Schizoaffective d/o   DMII -Most recent A1c 5.8 -monitor fs  poc    B/l lower extremity amputee Left leg pain  Chronic pain  -continue the use of gabapentin, oxycodone and muscle relaxants -need to f/u with pain clinic as outpatient    Polysubstance abuse   -Toxicology was positive for cocaine.     DVT prophylaxis: Subcutaneous Lovenox Code Status: Full code Family Communication:  Disposition Plan: Patient remains inpatient.   Consultants:  Low threshold to consult cardiology team.  Procedures:  None  Antimicrobials:  None.   Subjective: No new complaints. Not very cooperative with medical  intervention.  Objective: Vitals:   01/15/24 0156 01/15/24 0615 01/15/24 0729 01/15/24 1115  BP: (!) 96/54 106/67 127/86 (!) 125/114  Pulse: 72 77 89 74  Resp: 18 20 20 20   Temp: (!) 97.4 F (36.3 C) 98.5 F (36.9 C) 98.5 F (36.9 C) 99.7 F (37.6 C)  TempSrc: Oral Oral Oral Oral  SpO2: 96% 100% 100% 96%  Weight:      Height:        Intake/Output Summary (Last 24 hours) at 01/15/2024 1230 Last data filed at 01/15/2024 1100 Gross per 24 hour  Intake 1740 ml  Output 4415 ml  Net -2675 ml   Filed Weights   01/13/24 1159 01/14/24 1500  Weight: 74.8 kg 79.8 kg    Examination:  General exam: Not in any distress.  Awake and alert.    Respiratory system: Decreased air entry, with mild expiratory wheeze.   Cardiovascular system: S1 & S2 heard Gastrointestinal system: Abdomen is soft and nontender.   Central nervous system: Awake and alert.  Moves all extremities.   Extremities: Status post amputation of bilateral lower extremities.  Data Reviewed: I have personally reviewed following labs and imaging studies  CBC: Recent Labs  Lab 01/13/24 1936 01/14/24 0732  WBC 7.2 6.1  NEUTROABS 3.9  --   HGB 14.6 13.9  HCT 45.0 42.3  MCV 98.0 98.1  PLT 273 258   Basic Metabolic Panel: Recent Labs  Lab 01/13/24 1936 01/14/24 0732  NA 140 139  K 4.1 4.1  CL 105 107  CO2 25 21*  GLUCOSE 145* 109*  BUN 29* 24*  CREATININE  1.08 1.00  1.01  CALCIUM 8.7* 8.5*  MG  --  2.0  PHOS  --  4.3   GFR: Estimated Creatinine Clearance: 82.6 mL/min (by C-G formula based on SCr of 1.01 mg/dL). Liver Function Tests: Recent Labs  Lab 01/13/24 1936 01/14/24 0732  AST 29 22  ALT 33 30  ALKPHOS 72 65  BILITOT 1.1 0.8  PROT 6.4* 6.0*  ALBUMIN 3.3* 3.1*   No results for input(s): "LIPASE", "AMYLASE" in the last 168 hours. No results for input(s): "AMMONIA" in the last 168 hours. Coagulation Profile: No results for input(s): "INR", "PROTIME" in the last 168 hours. Cardiac  Enzymes: No results for input(s): "CKTOTAL", "CKMB", "CKMBINDEX", "TROPONINI" in the last 168 hours. BNP (last 3 results) No results for input(s): "PROBNP" in the last 8760 hours. HbA1C: No results for input(s): "HGBA1C" in the last 72 hours. CBG: No results for input(s): "GLUCAP" in the last 168 hours. Lipid Profile: No results for input(s): "CHOL", "HDL", "LDLCALC", "TRIG", "CHOLHDL", "LDLDIRECT" in the last 72 hours. Thyroid Function Tests: No results for input(s): "TSH", "T4TOTAL", "FREET4", "T3FREE", "THYROIDAB" in the last 72 hours. Anemia Panel: No results for input(s): "VITAMINB12", "FOLATE", "FERRITIN", "TIBC", "IRON", "RETICCTPCT" in the last 72 hours. Urine analysis:    Component Value Date/Time   COLORURINE YELLOW 01/02/2024 1454   APPEARANCEUR CLEAR 01/02/2024 1454   LABSPEC >1.030 (H) 01/02/2024 1454   PHURINE 5.5 01/02/2024 1454   GLUCOSEU NEGATIVE 01/02/2024 1454   HGBUR NEGATIVE 01/02/2024 1454   BILIRUBINUR SMALL (A) 01/02/2024 1454   KETONESUR NEGATIVE 01/02/2024 1454   PROTEINUR 100 (A) 01/02/2024 1454   NITRITE NEGATIVE 01/02/2024 1454   LEUKOCYTESUR NEGATIVE 01/02/2024 1454   Sepsis Labs: @LABRCNTIP (procalcitonin:4,lacticidven:4)  ) Recent Results (from the past 240 hours)  Resp panel by RT-PCR (RSV, Flu A&B, Covid) Anterior Nasal Swab     Status: None   Collection Time: 01/13/24  7:38 PM   Specimen: Anterior Nasal Swab  Result Value Ref Range Status   SARS Coronavirus 2 by RT PCR NEGATIVE NEGATIVE Final   Influenza A by PCR NEGATIVE NEGATIVE Final   Influenza B by PCR NEGATIVE NEGATIVE Final    Comment: (NOTE) The Xpert Xpress SARS-CoV-2/FLU/RSV plus assay is intended as an aid in the diagnosis of influenza from Nasopharyngeal swab specimens and should not be used as a sole basis for treatment. Nasal washings and aspirates are unacceptable for Xpert Xpress SARS-CoV-2/FLU/RSV testing.  Fact Sheet for  Patients: BloggerCourse.com  Fact Sheet for Healthcare Providers: SeriousBroker.it  This test is not yet approved or cleared by the [place] FDA and has been authorized for detection and/or diagnosis of SARS-CoV-2 by FDA under an Emergency Use Authorization (EUA). This EUA will remain in effect (meaning this test can be used) for the duration of the COVID-19 declaration under Section 564(b)(1) of the Act, 21 U.S.C. section 360bbb-3(b)(1), unless the authorization is terminated or revoked.     Resp Syncytial Virus by PCR NEGATIVE NEGATIVE Final    Comment: (NOTE) Fact Sheet for Patients: BloggerCourse.com  Fact Sheet for Healthcare Providers: SeriousBroker.it  This test is not yet approved or cleared by the [place] FDA and has been authorized for detection and/or diagnosis of SARS-CoV-2 by FDA under an Emergency Use Authorization (EUA). This EUA will remain in effect (meaning this test can be used) for the duration of the COVID-19 declaration under Section 564(b)(1) of the Act, 21 U.S.C. section 360bbb-3(b)(1), unless the authorization is terminated or revoked.  Performed at [facility]  [facility] Lab, [address], [place], [name] [address]          Radiology Studies: ECHOCARDIOGRAM LIMITED Result Date: 01/14/2024    ECHOCARDIOGRAM LIMITED REPORT   Patient Name:   [name] Date of Exam: 01/14/2024 Medical Rec #:  [id number]      Height:       69.0 in Accession #:    [id number]     Weight:       165.0 lb Date of Birth:  1966-04-23      BSA:          1.904 m Patient Age:    57 years       BP:           95/65 mmHg Patient Gender: M              HR:           71 bpm. Exam Location:  Inpatient Procedure: Limited Echo, Cardiac Doppler and Color Doppler (Both Spectral and            Color Flow Doppler were utilized during procedure). Indications:    CHF- Acute Systolic I50.21   History:        Patient has prior history of Echocardiogram examinations, most                 recent 01/04/2024. CHF, COPD; Risk Factors:Diabetes and                 Hypertension.  Sonographer:    Harriette Bouillon RDCS Referring Phys: [id number] SARA-MAIZ A THOMAS IMPRESSIONS  1. Left ventricular ejection fraction, by estimation, is <20%. The left ventricle has severely decreased function. The left ventricle demonstrates global hypokinesis. The left ventricular internal cavity size was mildly dilated. There is mild concentric  left ventricular hypertrophy. Left ventricular diastolic parameters are indeterminate.  2. Right ventricular systolic function is severely reduced. The right ventricular size is mildly enlarged. Tricuspid regurgitation signal is inadequate for assessing PA pressure.  3. The mitral valve is normal in structure. Trivial mitral valve regurgitation. No evidence of mitral stenosis.  4. The aortic valve is tricuspid. There is mild calcification of the aortic valve. Aortic valve regurgitation is not visualized. No aortic stenosis is present.  5. The inferior vena cava is dilated in size with <50% respiratory variability, suggesting right atrial pressure of 15 mmHg.  6. Limited echo. FINDINGS  Left Ventricle: Left ventricular ejection fraction, by estimation, is <20%. The left ventricle has severely decreased function. The left ventricle demonstrates global hypokinesis. The left ventricular internal cavity size was mildly dilated. There is mild concentric left ventricular hypertrophy. Left ventricular diastolic parameters are indeterminate. Right Ventricle: The right ventricular size is mildly enlarged. Right ventricular systolic function is severely reduced. Tricuspid regurgitation signal is inadequate for assessing PA pressure. Mitral Valve: The mitral valve is normal in structure. Trivial mitral valve regurgitation. No evidence of mitral valve stenosis. Tricuspid Valve: The tricuspid valve is normal in  structure. Tricuspid valve regurgitation is not demonstrated. Aortic Valve: The aortic valve is tricuspid. There is mild calcification of the aortic valve. Aortic valve regurgitation is not visualized. No aortic stenosis is present. Aorta: The aortic root is normal in size and structure. Venous: The inferior vena cava is dilated in size with less than 50% respiratory variability, suggesting right atrial pressure of 15 mmHg. LEFT VENTRICLE PLAX 2D LVIDd:         6.20 cm LVIDs:  5.60 cm LV PW:         1.20 cm LV IVS:        1.20 cm LVOT diam:     2.10 cm LVOT Area:     3.46 cm  LV Volumes (MOD) LV vol d, MOD A4C: 117.0 ml LV vol s, MOD A4C: 104.0 ml LV SV MOD A4C:     117.0 ml IVC IVC diam: 2.90 cm LEFT ATRIUM         Index LA diam:    4.50 cm 2.36 cm/m   AORTA Ao Root diam: 3.40 cm  SHUNTS Systemic Diam: 2.10 cm Dalton McleanMD Electronically signed by Wilfred Lacy Signature Date/Time: 01/14/2024/3:14:34 PM    Final    DG Tibia/Fibula Left Result Date: 01/14/2024 CLINICAL DATA:  Pain of left lower extremity amputation stump. EXAM: LEFT TIBIA AND FIBULA - 2 VIEW COMPARISON:  Apr 25, 2023. FINDINGS: Status post left below-the-knee amputation. The tibial and fibular diaphyseal transsection margins are intact. No erosive changes. Similar heterotopic ossification is again noted. Mild medial compartmental osteophytosis. Subcutaneous soft tissue edema at the amputation stump. No soft tissue gas identified. IMPRESSION: 1. No acute osseous abnormality. 2. Subcutaneous soft tissue edema of the amputation stump. Electronically Signed   By: Hart Robinsons M.D.   On: 01/14/2024 12:03   US Abdomen Limited RUQ (LIVER/GB) Result Date: 01/13/2024 CLINICAL DATA:  Follow-up findings suspicious for cholecystitis on prior chest CT. EXAM: ULTRASOUND ABDOMEN LIMITED RIGHT UPPER QUADRANT COMPARISON:  None Available. FINDINGS: Gallbladder: The gallbladder is partially contracted, without evidence of gallstones. The  gallbladder wall measures 3.5 mm in thickness. A trace amount of pericholecystic fluid is seen. No sonographic Murphy sign noted by sonographer. Common bile duct: Diameter: 2.4 mm Liver: No focal lesion identified. Diffusely increased echogenicity of the liver parenchyma is noted. Portal vein is patent on color Doppler imaging with normal direction of blood flow towards the liver. Other: Limited study secondary to rapid patient breathing, as per the ultrasound technologist. IMPRESSION: 1. Trace amount of pericholecystic fluid without evidence of cholelithiasis or acute cholecystitis. 2. Hepatic steatosis. Electronically Signed   By: Aram Candela M.D.   On: 01/13/2024 23:52   CT Angio Chest PE W and/or Wo Contrast Result Date: 01/13/2024 CLINICAL DATA:  Pulmonary embolism suspected, high probability. Pleuritic chest pain, shortness of breath, and tachycardia. EXAM: CT ANGIOGRAPHY CHEST WITH CONTRAST TECHNIQUE: Multidetector CT imaging of the chest was performed using the standard protocol during bolus administration of intravenous contrast. Multiplanar CT image reconstructions and MIPs were obtained to evaluate the vascular anatomy. RADIATION DOSE REDUCTION: This exam was performed according to the departmental dose-optimization program which includes automated exposure control, adjustment of the mA and/or kV according to patient size and/or use of iterative reconstruction technique. CONTRAST:  65mL OMNIPAQUE IOHEXOL 350 MG/ML SOLN COMPARISON:  12/07/2022. FINDINGS: Cardiovascular: The heart is enlarged and there is a trace pericardial effusion. Three-vessel coronary artery calcifications are noted. Is atherosclerotic calcification of the aorta without evidence of aneurysm. The pulmonary trunk is borderline distended. No definite evidence of pulmonary embolism. Mediastinum/Nodes: No mediastinal or axillary lymphadenopathy. Prominent hilar lymph nodes are noted bilaterally. The thyroid gland, trachea, and  esophagus are within normal limits. Lungs/Pleura: Centrilobular emphysematous changes are present in the lungs. Bronchial wall thickening is present bilaterally. Stable scattered atelectasis or scarring is noted in the right lung. No effusion or pneumothorax is seen. Upper Abdomen: There suspected gallbladder wall thickening versus pericholecystic edema. Musculoskeletal: Degenerative changes are noted in the  thoracolumbar spine. No acute osseous abnormality. Review of the MIP images confirms the above findings. IMPRESSION: 1. No evidence of pulmonary embolism or other acute intrathoracic process 2. Stable atelectasis or scarring in the right lung. 3. Emphysema. 4. Cardiomegaly with coronary artery calcifications. 5. Aortic atherosclerosis. 6. Suspected gallbladder wall thickening versus pericholecystic edema. Ultrasound is suggested for further evaluation. Electronically Signed   By: Thornell Sartorius M.D.   On: 01/13/2024 22:11   DG Chest 2 View Result Date: 01/13/2024 CLINICAL DATA:  Shortness of breath.  History of amputations. EXAM: CHEST - 2 VIEW COMPARISON:  01/02/2024. FINDINGS: Low lung volume. Mild pulmonary vascular congestion. No frank pulmonary edema. Redemonstration of approximately 2 x 2.5 cm opacity overlying the right lower lung zone, laterally, abutting the minor fissure, which is grossly similar to the prior studies and corresponds to peripheral opacity/atelectasis on the prior CT scan from 12/07/2022. No new dense consolidation or lung collapse. Bilateral costophrenic angles are clear. Note is made of elevated right hemidiaphragm. Stable mildly enlarged cardio-mediastinal silhouette. No acute osseous abnormalities. The soft tissues are within normal limits. IMPRESSION: *No acute cardiopulmonary disease. *Mild pulmonary vascular congestion.  No frank pulmonary edema. *Stable faint opacity overlying the right lower lung zone, laterally, abutting the minor fissure, as discussed above. Electronically  Signed   By: Jules Schick M.D.   On: 01/13/2024 15:18        Scheduled Meds:  ARIPiprazole  5 mg Oral Daily   carvedilol  12.5 mg Oral BID WC   enoxaparin (LOVENOX) injection  40 mg Subcutaneous Q24H   furosemide  40 mg Intravenous Q12H   gabapentin  600 mg Oral TID   isosorbide-hydrALAZINE  1 tablet Oral TID   pantoprazole  40 mg Oral Daily   Continuous Infusions:   LOS: 1 day    Time spent: 35 minutes.    Berton Mount, MD  [facility] Pager #: [phone] 7PM-7AM contact night coverage as above

## 2024-01-15 NOTE — Progress Notes (Signed)
RN encouraged patient to drink a zero sugar cola than a regular one but patient starts to raise his voice and refused drinking it. Education provided but still wants the regular one.

## 2024-01-15 NOTE — Plan of Care (Signed)
  Problem: Nutrition: Goal: Adequate nutrition will be maintained Outcome: Progressing   Problem: Activity: Goal: Risk for activity intolerance will decrease Outcome: Progressing   Problem: Safety: Goal: Ability to remain free from injury will improve Outcome: Progressing

## 2024-01-16 DIAGNOSIS — I5023 Acute on chronic systolic (congestive) heart failure: Secondary | ICD-10-CM | POA: Diagnosis not present

## 2024-01-16 LAB — RENAL FUNCTION PANEL
Albumin: 3.2 g/dL — ABNORMAL LOW (ref 3.5–5.0)
Anion gap: 15 (ref 5–15)
BUN: 19 mg/dL (ref 6–20)
CO2: 24 mmol/L (ref 22–32)
Calcium: 8.9 mg/dL (ref 8.9–10.3)
Chloride: 101 mmol/L (ref 98–111)
Creatinine, Ser: 1.54 mg/dL — ABNORMAL HIGH (ref 0.61–1.24)
GFR, Estimated: 53 mL/min — ABNORMAL LOW (ref >60)
Glucose, Bld: 136 mg/dL — ABNORMAL HIGH (ref 70–99)
Phosphorus: 2.7 mg/dL (ref 2.5–4.6)
Potassium: 4.4 mmol/L (ref 3.5–5.1)
Sodium: 140 mmol/L (ref 135–145)

## 2024-01-16 LAB — CBC WITH DIFFERENTIAL/PLATELET
Abs Immature Granulocytes: 0.02 10*3/uL (ref 0.00–0.07)
Basophils Absolute: 0 10*3/uL (ref 0.0–0.1)
Basophils Relative: 0 %
Eosinophils Absolute: 0 10*3/uL (ref 0.0–0.5)
Eosinophils Relative: 0 %
HCT: 43.6 % (ref 39.0–52.0)
Hemoglobin: 14.4 g/dL (ref 13.0–17.0)
Immature Granulocytes: 0 %
Lymphocytes Relative: 12 %
Lymphs Abs: 1.1 10*3/uL (ref 0.7–4.0)
MCH: 31.5 pg (ref 26.0–34.0)
MCHC: 33 g/dL (ref 30.0–36.0)
MCV: 95.4 fL (ref 80.0–100.0)
Monocytes Absolute: 0.5 10*3/uL (ref 0.1–1.0)
Monocytes Relative: 6 %
Neutro Abs: 7.3 10*3/uL (ref 1.7–7.7)
Neutrophils Relative %: 82 %
Platelets: 245 10*3/uL (ref 150–400)
RBC: 4.57 MIL/uL (ref 4.22–5.81)
RDW: 14.8 % (ref 11.5–15.5)
WBC: 9 10*3/uL (ref 4.0–10.5)
nRBC: 0 % (ref 0.0–0.2)

## 2024-01-16 LAB — BRAIN NATRIURETIC PEPTIDE: B Natriuretic Peptide: 716.5 pg/mL — ABNORMAL HIGH (ref 0.0–100.0)

## 2024-01-16 LAB — MAGNESIUM: Magnesium: 1.9 mg/dL (ref 1.7–2.4)

## 2024-01-16 MED ORDER — METHYLPREDNISOLONE SODIUM SUCC 40 MG IJ SOLR
40.0000 mg | Freq: Two times a day (BID) | INTRAMUSCULAR | Status: DC
  Administered 2024-01-16 – 2024-01-18 (×4): 40 mg via INTRAVENOUS
  Filled 2024-01-16 (×4): qty 1

## 2024-01-16 MED ORDER — IPRATROPIUM-ALBUTEROL 0.5-2.5 (3) MG/3ML IN SOLN
3.0000 mL | Freq: Four times a day (QID) | RESPIRATORY_TRACT | Status: DC
  Administered 2024-01-16 – 2024-01-18 (×7): 3 mL via RESPIRATORY_TRACT
  Filled 2024-01-16 (×7): qty 3

## 2024-01-16 MED ORDER — IPRATROPIUM-ALBUTEROL 0.5-2.5 (3) MG/3ML IN SOLN: 3.0000 mL | Freq: Two times a day (BID) | RESPIRATORY_TRACT | Status: DC

## 2024-01-16 NOTE — Progress Notes (Signed)
2000--Pt requesting food; NT given sandwich tray and graham crackers. Pt refusing telemetry at this time.  2300--Pt accepting vitals and telemetry at this time. Pt requesting food; given 2 packs of graham crackers.  0000--Pt given fluids and 3 packs of graham crackers per request. Education given on diet restrictions.  0200--Pt given pain meds (see MAR) and requesting more snacks. Given 1 pack of graham crackers and educated on carb modified diet; offered alternatives to graham crackers and sandwiches but pt refused. Pt became upset that he cannot have more food, requesting to speak with [facility] because "he will be fair."  0300--Pt refusing a.m. labs. AC and RN to bedside per pt request to discuss dietary expectations and restrictions. Pt reluctantly amenable to reinforcement of dietary restrictions when discussed with AC. MD Toniann Fail notified and updated per [facility] request.

## 2024-01-16 NOTE — Progress Notes (Signed)
This RN Database administrator contacted by Clydie Braun, RN, CN on 3E to notify me that patient is requesting to speak with me. She explained that patient has been requesting food and drink all night but with his dietary restrictions they are unable to provide him with further nutrition until morning, and this has upset the patient. Clydie Braun also stated that patient has been calling repeatedly requesting to speak with the [employer] and will not stop.   I conducted a brief chart review and then walked to the unit.   Entered room to speak with patient. He explained that he doesn't feel like he is being treated fairly with regard to food. He stated "all these other people around here get what they want while I'm stuck eating graham crackers, how is that fair?". Patient verbalized understanding of his fluid restriction without prompting and stated "but that doesn't apply to food, I should be able to eat whatever I want". He went on stating "all I want is a [place] sandwich".   I accessed the patient's order list on the workstation within his room and reviewed the diet that has been ordered for the patient with him. I explained that given his heart healthy/carb modified diet order, we cannot provide him  excessive amounts of carbohydrates.  Patient said "okay", and activated his call light to request his nurse come to the room and stated to me "It is after midnight, so my count resets and I can have 1200 again".   Apparent to me at this point that patient doesn't understand the distinction between his fluid restriction and food consumption; so I attempted to clarify the difference between the two with the patient.   The patient's primary nurse entered the room. I asked the RN what food he had given the patient tonight. The primary RN stated "a [place] sandwich tray and 6 packs of graham crackers". Immediately patient began arguing the quantity of graham crackers provided. This RN could see at least 3  empty graham cracker wrappers near the top of the patient's trash can and another empty wrapper on his side table.   After exhaustive discussion about how many graham crackers the patient was provided he eventually stated "well what do you think, and we'll go with whatever you say". I explained that my judgement as an RN is given your carb restriction, you've already had quite a lot tonight; and breakfast will come in just a few hours, so I would advise you to wait until then.   Patient stated "fine then, I guess we're done" and invited [place] to leave. I wished the patient well and encouraged him to call if there is anything else he needs during his stay.

## 2024-01-16 NOTE — Progress Notes (Signed)
Mobility Specialist Progress Note:    01/16/24 1606  Mobility  Activity Ambulated with assistance in hallway  Level of Assistance Standby assist, set-up cues, supervision of patient - no hands on  Assistive Device Wheelchair  Distance Ambulated (ft) 100 ft (propelled by w/c)  Activity Response Tolerated well  Mobility Referral Yes  Mobility visit 1 Mobility  Mobility Specialist Start Time (ACUTE ONLY) 1415  Mobility Specialist Stop Time (ACUTE ONLY) 1429  Mobility Specialist Time Calculation (min) (ACUTE ONLY) 14 min   Pt received in wheelchair in room in a much better mood agreeable to mobility. No c/o throughout. Pt able to propel w/c independently. Returned to room w/o fault. Left in wheelchair. All needs met.  Thompson Grayer Mobility Specialist  Please contact vis Secure Chat or  Rehab Office [phone]

## 2024-01-16 NOTE — Progress Notes (Signed)
Pt refused physical assessment with this Clinical research associate, verbally aggressive to staff, on and off cardiac monitor.  Patient is fixated on food and drinks. Patient took his night time medications given by other RN.

## 2024-01-16 NOTE — Progress Notes (Addendum)
Spoke with pt made aware that MD ordered fUS of abdomen and he needs to be NPO/no food  for 6 hrs.Pt is very pleasant and agreeable not to eat within the time frame., from 1pm to 7pm.  Spoke with [place] Tech , (Shenella)stated he can still drink but no food.  Above order cancelled per MD  Diet change to Regular diet.

## 2024-01-16 NOTE — Progress Notes (Signed)
PROGRESS NOTE    [name]  NWG:[id number] DOB: 01-Jun-1966 DOA: 01/13/2024 PCP: Georganna Skeans, MD  Outpatient Specialists:     Brief Narrative:  Patient is a 58 year old African-American male, with past medical history significant for Asthma, COPD, GERD, HTN , prolonged QT, Depression, Schizoaffective d/o, CHFref, , DMII, s/p b/l lower extremity amputee, chronic pain, and polysubstance abuse.  Patient was recently discharged from the hospital.  Patient was readmitted with shortness of breath/acute on chronic CHF with reduced EF (EF of less than 20%), asthma/COPD and uncontrolled hypertension.  01/16/2024: Patient seen alongside patient's nurse.  Patient insists on being off heart regular diet (instead of cardiac diet).  Patient is also asking for pain medications.  Patient is not keen on any lab work for today.  Will try repeating lab work tomorrow.  Patient is wheezing.  Patient is refusing medical management significantly.   Assessment & Plan:   Active Problems:   CHF exacerbation (HCC)   Acute CHF exacerbation with rEF -ef <20% -currently on bipap in ed , will wean as able  -lasix 40 mg iv bid  -ekg without hyperacute st -twave changes  -resume carvedilol and bidil  -cardiology consult in am  01/16/2024: Continue IV Lasix.  Low threshold to consult the cardiology team.   Asthma/ COPD: -Patient is wheezing. -Patient is refusing management. -Increase nebs DuoNeb from twice daily to every 6 hourly. -Change oral prednisone to IV Solu-Medrol 40 Mg every 12 hourly. -Continue nebs albuterol as needed. -Continue nebs Pulmicort.     GERD -ppi   Uncontrolled hypertension:    -Blood pressure is currently controlled.   -Continue Coreg 12.5 Mg p.o. twice daily. -Continue to monitor closely. -Goal blood pressure should be less than 130/80 mmHg.     Depression Schizoaffective d/o   DMII -Most recent A1c 5.8 -monitor fs  poc    B/l lower extremity amputee Status post  left BKA and right AKA.   Chronic pain  -continue the use of gabapentin, oxycodone and muscle relaxants -need to f/u with pain clinic as outpatient    Polysubstance abuse: -Toxicology was positive for cocaine.     DVT prophylaxis: Subcutaneous Lovenox Code Status: Full code Family Communication:  Disposition Plan: Patient remains inpatient.   Consultants:  Low threshold to consult cardiology team.  Procedures:  None  Antimicrobials:  None.   Subjective: No new complaints. Not very cooperative with medical intervention. Patient is wheezing.  Objective: Vitals:   01/16/24 0842 01/16/24 1130 01/16/24 1500 01/16/24 1525  BP:   106/73 106/73  Pulse:    94  Resp:    20  Temp:    98 F (36.7 C)  TempSrc:  Other (Comment)  Oral  SpO2: 95%   99%  Weight:      Height:        Intake/Output Summary (Last 24 hours) at 01/16/2024 1744 Last data filed at 01/16/2024 1526 Gross per 24 hour  Intake 1370 ml  Output 3365 ml  Net -1995 ml   Filed Weights   01/13/24 1159 01/14/24 1500  Weight: 74.8 kg 79.8 kg    Examination:  General exam: Not in any distress.  Awake and alert.    Respiratory system: Decreased air entry, with mild expiratory wheeze.   Cardiovascular system: S1 & S2 heard Gastrointestinal system: Abdomen is soft and nontender.   Central nervous system: Awake and alert.  Moves all extremities.   Extremities: Status post amputation of bilateral lower extremities.  Data Reviewed: I have  personally reviewed following labs and imaging studies  CBC: Recent Labs  Lab 01/13/24 1936 01/14/24 0732  WBC 7.2 6.1  NEUTROABS 3.9  --   HGB 14.6 13.9  HCT 45.0 42.3  MCV 98.0 98.1  PLT 273 258   Basic Metabolic Panel: Recent Labs  Lab 01/13/24 1936 01/14/24 0732  NA 140 139  K 4.1 4.1  CL 105 107  CO2 25 21*  GLUCOSE 145* 109*  BUN 29* 24*  CREATININE 1.08 1.00  1.01  CALCIUM 8.7* 8.5*  MG  --  2.0  PHOS  --  4.3   GFR: Estimated Creatinine  Clearance: 82.6 mL/min (by C-G formula based on SCr of 1.01 mg/dL). Liver Function Tests: Recent Labs  Lab 01/13/24 1936 01/14/24 0732  AST 29 22  ALT 33 30  ALKPHOS 72 65  BILITOT 1.1 0.8  PROT 6.4* 6.0*  ALBUMIN 3.3* 3.1*   No results for input(s): "LIPASE", "AMYLASE" in the last 168 hours. No results for input(s): "AMMONIA" in the last 168 hours. Coagulation Profile: No results for input(s): "INR", "PROTIME" in the last 168 hours. Cardiac Enzymes: No results for input(s): "CKTOTAL", "CKMB", "CKMBINDEX", "TROPONINI" in the last 168 hours. BNP (last 3 results) No results for input(s): "PROBNP" in the last 8760 hours. HbA1C: No results for input(s): "HGBA1C" in the last 72 hours. CBG: No results for input(s): "GLUCAP" in the last 168 hours. Lipid Profile: No results for input(s): "CHOL", "HDL", "LDLCALC", "TRIG", "CHOLHDL", "LDLDIRECT" in the last 72 hours. Thyroid Function Tests: No results for input(s): "TSH", "T4TOTAL", "FREET4", "T3FREE", "THYROIDAB" in the last 72 hours. Anemia Panel: No results for input(s): "VITAMINB12", "FOLATE", "FERRITIN", "TIBC", "IRON", "RETICCTPCT" in the last 72 hours. Urine analysis:    Component Value Date/Time   COLORURINE YELLOW 01/02/2024 1454   APPEARANCEUR CLEAR 01/02/2024 1454   LABSPEC >1.030 (H) 01/02/2024 1454   PHURINE 5.5 01/02/2024 1454   GLUCOSEU NEGATIVE 01/02/2024 1454   HGBUR NEGATIVE 01/02/2024 1454   BILIRUBINUR SMALL (A) 01/02/2024 1454   KETONESUR NEGATIVE 01/02/2024 1454   PROTEINUR 100 (A) 01/02/2024 1454   NITRITE NEGATIVE 01/02/2024 1454   LEUKOCYTESUR NEGATIVE 01/02/2024 1454   Sepsis Labs: @LABRCNTIP (procalcitonin:4,lacticidven:4)  ) Recent Results (from the past 240 hours)  Resp panel by RT-PCR (RSV, Flu A&B, Covid) Anterior Nasal Swab     Status: None   Collection Time: 01/13/24  7:38 PM   Specimen: Anterior Nasal Swab  Result Value Ref Range Status   SARS Coronavirus 2 by RT PCR NEGATIVE NEGATIVE  Final   Influenza A by PCR NEGATIVE NEGATIVE Final   Influenza B by PCR NEGATIVE NEGATIVE Final    Comment: (NOTE) The Xpert Xpress SARS-CoV-2/FLU/RSV plus assay is intended as an aid in the diagnosis of influenza from Nasopharyngeal swab specimens and should not be used as a sole basis for treatment. Nasal washings and aspirates are unacceptable for Xpert Xpress SARS-CoV-2/FLU/RSV testing.  Fact Sheet for Patients: BloggerCourse.com  Fact Sheet for Healthcare Providers: SeriousBroker.it  This test is not yet approved or cleared by the [place] FDA and has been authorized for detection and/or diagnosis of SARS-CoV-2 by FDA under an Emergency Use Authorization (EUA). This EUA will remain in effect (meaning this test can be used) for the duration of the COVID-19 declaration under Section 564(b)(1) of the Act, 21 U.S.C. section 360bbb-3(b)(1), unless the authorization is terminated or revoked.     Resp Syncytial Virus by PCR NEGATIVE NEGATIVE Final    Comment: (NOTE) Fact Sheet for  Patients: BloggerCourse.com  Fact Sheet for Healthcare Providers: SeriousBroker.it  This test is not yet approved or cleared by the [place] FDA and has been authorized for detection and/or diagnosis of SARS-CoV-2 by FDA under an Emergency Use Authorization (EUA). This EUA will remain in effect (meaning this test can be used) for the duration of the COVID-19 declaration under Section 564(b)(1) of the Act, 21 U.S.C. section 360bbb-3(b)(1), unless the authorization is terminated or revoked.  Performed at [facility], [address], [place], [name] [address]          Radiology Studies: No results found.       Scheduled Meds:  ARIPiprazole  5 mg Oral Daily   budesonide (PULMICORT) nebulizer solution  0.25 mg Nebulization BID   carvedilol  12.5 mg Oral BID WC    enoxaparin (LOVENOX) injection  40 mg Subcutaneous Q24H   furosemide  40 mg Intravenous Q12H   gabapentin  600 mg Oral TID   ipratropium-albuterol  3 mL Nebulization BID   isosorbide-hydrALAZINE  1 tablet Oral TID   pantoprazole  40 mg Oral Daily   predniSONE  40 mg Oral Q breakfast   Continuous Infusions:   LOS: 2 days    Time spent: 35 minutes.    Berton Mount, MD  [facility] Pager #: [phone] 7PM-7AM contact night coverage as above

## 2024-01-16 NOTE — Progress Notes (Signed)
Received pt, Alert and oriented,irritable   but  cooperative at this time.  Just finished  his breakfast but verbalized wants more food.

## 2024-01-16 NOTE — Progress Notes (Signed)
Patient refused his scheduled lasix this morning.

## 2024-01-17 DIAGNOSIS — I5023 Acute on chronic systolic (congestive) heart failure: Secondary | ICD-10-CM | POA: Diagnosis not present

## 2024-01-17 MED ORDER — TORSEMIDE 20 MG PO TABS
40.0000 mg | ORAL_TABLET | Freq: Every day | ORAL | Status: DC
  Administered 2024-01-18: 40 mg via ORAL
  Filled 2024-01-17: qty 2

## 2024-01-17 NOTE — Progress Notes (Signed)
PROGRESS NOTE    Ranferi Clingan  ZOX:[id number] DOB: Dec 16, 1965 DOA: 01/13/2024 PCP: Georganna Skeans, MD  Outpatient Specialists:     Brief Narrative:  Patient is a 58 year old African-American male, with past medical history significant for Asthma, COPD, GERD, HTN , prolonged QT, Depression, Schizoaffective d/o, CHFref, , DMII, s/p b/l lower extremity amputee, chronic pain, and polysubstance abuse.  Patient was recently discharged from the hospital.  Patient was readmitted with shortness of breath/acute on chronic CHF with reduced EF (EF of less than 20%), asthma/COPD and uncontrolled hypertension.  01/16/2024: Patient seen alongside patient's nurse.  Patient insists on being off heart regular diet (instead of cardiac diet).  Patient is also asking for pain medications.  Patient is not keen on any lab work for today.  Will try repeating lab work tomorrow.  Patient is wheezing.  Patient is refusing medical management significantly.  01/17/2024: Patient seen.  Patient looks better today.  Wheezing has improved significantly.  Likely, patient will be discharged back on tomorrow.  Transition of care team to assist with discharge needs.    Assessment & Plan:   Active Problems:   CHF exacerbation (HCC)   Acute CHF exacerbation with rEF -ef <20% -Discontinue IV lasix 40 mg twice daily.   -Start torsemide 40 Mg p.o. once daily. -Continue carvedilol and bidil  01/17/2024: Discontinue IV Lasix.  Start torsemide 40 Mg p.o. once daily.   Asthma/ COPD: -Patient is wheezing. -Patient is refusing management. -Increase nebs DuoNeb from twice daily to every 6 hourly. -Change oral prednisone to IV Solu-Medrol 40 Mg every 12 hourly. -Continue nebs albuterol as needed. -Continue nebs Pulmicort. 01/17/2024: Improved significantly.  Transition to oral prednisone tomorrow.   GERD -ppi   Uncontrolled hypertension:    -Blood pressure is reasonably controlled.        -Continue Coreg 12.5 Mg p.o. twice  daily. -Continue to monitor closely. -Goal blood pressure should be less than 130/80 mmHg.     Depression Schizoaffective d/o   DMII -Most recent A1c 5.8 -monitor fs  poc    B/l lower extremity amputee Status post bilateral BKA.     Chronic pain  -continue the use of gabapentin, oxycodone and muscle relaxants -need to f/u with pain clinic as outpatient    Polysubstance abuse: -Toxicology was positive for cocaine.     DVT prophylaxis: Subcutaneous Lovenox Code Status: Full code Family Communication:  Disposition Plan: Patient remains inpatient.   Consultants:  Low threshold to consult cardiology team.  Procedures:  None  Antimicrobials:  None.   Subjective: No new complaints. Symptoms are improving. Wheezing has improved.  Objective: Vitals:   01/17/24 0810 01/17/24 0824 01/17/24 1301 01/17/24 1417  BP: 104/65  (!) 148/96   Pulse: 95  89   Resp: 18  18   Temp: 98.2 F (36.8 C)  97.7 F (36.5 C)   TempSrc: Oral  Oral   SpO2: 96% 97% 96% 99%  Weight:      Height:        Intake/Output Summary (Last 24 hours) at 01/17/2024 1541 Last data filed at 01/17/2024 0809 Gross per 24 hour  Intake 800 ml  Output 3625 ml  Net -2825 ml   Filed Weights   01/13/24 1159 01/14/24 1500 01/17/24 0658  Weight: 74.8 kg 79.8 kg 78.5 kg    Examination:  General exam: Not in any distress.  Awake and alert.    Respiratory system: Decreased air entry, with mild expiratory wheeze.   Cardiovascular system: S1 &  S2 heard Gastrointestinal system: Abdomen is soft and nontender.   Central nervous system: Awake and alert.  Moves all extremities.   Extremities: Status post amputation of bilateral lower extremities.  Data Reviewed: I have personally reviewed following labs and imaging studies  CBC: Recent Labs  Lab 01/13/24 1936 01/14/24 0732 01/16/24 1954  WBC 7.2 6.1 9.0  NEUTROABS 3.9  --  7.3  HGB 14.6 13.9 14.4  HCT 45.0 42.3 43.6  MCV 98.0 98.1 95.4  PLT 273  258 245   Basic Metabolic Panel: Recent Labs  Lab 01/13/24 1936 01/14/24 0732 01/16/24 1954  NA 140 139 140  K 4.1 4.1 4.4  CL 105 107 101  CO2 25 21* 24  GLUCOSE 145* 109* 136*  BUN 29* 24* 19  CREATININE 1.08 1.00  1.01 1.54*  CALCIUM 8.7* 8.5* 8.9  MG  --  2.0 1.9  PHOS  --  4.3 2.7   GFR: Estimated Creatinine Clearance: 54.2 mL/min (A) (by C-G formula based on SCr of 1.54 mg/dL (H)). Liver Function Tests: Recent Labs  Lab 01/13/24 1936 01/14/24 0732 01/16/24 1954  AST 29 22  --   ALT 33 30  --   ALKPHOS 72 65  --   BILITOT 1.1 0.8  --   PROT 6.4* 6.0*  --   ALBUMIN 3.3* 3.1* 3.2*   No results for input(s): "LIPASE", "AMYLASE" in the last 168 hours. No results for input(s): "AMMONIA" in the last 168 hours. Coagulation Profile: No results for input(s): "INR", "PROTIME" in the last 168 hours. Cardiac Enzymes: No results for input(s): "CKTOTAL", "CKMB", "CKMBINDEX", "TROPONINI" in the last 168 hours. BNP (last 3 results) No results for input(s): "PROBNP" in the last 8760 hours. HbA1C: No results for input(s): "HGBA1C" in the last 72 hours. CBG: No results for input(s): "GLUCAP" in the last 168 hours. Lipid Profile: No results for input(s): "CHOL", "HDL", "LDLCALC", "TRIG", "CHOLHDL", "LDLDIRECT" in the last 72 hours. Thyroid Function Tests: No results for input(s): "TSH", "T4TOTAL", "FREET4", "T3FREE", "THYROIDAB" in the last 72 hours. Anemia Panel: No results for input(s): "VITAMINB12", "FOLATE", "FERRITIN", "TIBC", "IRON", "RETICCTPCT" in the last 72 hours. Urine analysis:    Component Value Date/Time   COLORURINE YELLOW 01/02/2024 1454   APPEARANCEUR CLEAR 01/02/2024 1454   LABSPEC >1.030 (H) 01/02/2024 1454   PHURINE 5.5 01/02/2024 1454   GLUCOSEU NEGATIVE 01/02/2024 1454   HGBUR NEGATIVE 01/02/2024 1454   BILIRUBINUR SMALL (A) 01/02/2024 1454   KETONESUR NEGATIVE 01/02/2024 1454   PROTEINUR 100 (A) 01/02/2024 1454   NITRITE NEGATIVE 01/02/2024  1454   LEUKOCYTESUR NEGATIVE 01/02/2024 1454   Sepsis Labs: @LABRCNTIP (procalcitonin:4,lacticidven:4)  ) Recent Results (from the past 240 hours)  Resp panel by RT-PCR (RSV, Flu A&B, Covid) Anterior Nasal Swab     Status: None   Collection Time: 01/13/24  7:38 PM   Specimen: Anterior Nasal Swab  Result Value Ref Range Status   SARS Coronavirus 2 by RT PCR NEGATIVE NEGATIVE Final   Influenza A by PCR NEGATIVE NEGATIVE Final   Influenza B by PCR NEGATIVE NEGATIVE Final    Comment: (NOTE) The Xpert Xpress SARS-CoV-2/FLU/RSV plus assay is intended as an aid in the diagnosis of influenza from Nasopharyngeal swab specimens and should not be used as a sole basis for treatment. Nasal washings and aspirates are unacceptable for Xpert Xpress SARS-CoV-2/FLU/RSV testing.  Fact Sheet for Patients: BloggerCourse.com  Fact Sheet for Healthcare Providers: SeriousBroker.it  This test is not yet approved or cleared by the [place] FDA and has been authorized for detection and/or diagnosis of SARS-CoV-2 by FDA under an Emergency Use Authorization (EUA). This EUA will remain in effect (meaning this test can be used) for the duration of the COVID-19 declaration under Section 564(b)(1) of the Act, 21 U.S.C. section 360bbb-3(b)(1), unless the authorization is terminated or revoked.     Resp Syncytial Virus by PCR NEGATIVE NEGATIVE Final    Comment: (NOTE) Fact Sheet for Patients: BloggerCourse.com  Fact Sheet for Healthcare Providers: SeriousBroker.it  This test is not yet approved or cleared by the [place] FDA and has been authorized for detection and/or diagnosis of SARS-CoV-2 by FDA under an Emergency Use Authorization (EUA). This EUA will remain in effect (meaning this test can be used) for the duration of the COVID-19 declaration under Section 564(b)(1) of the Act, 21  U.S.C. section 360bbb-3(b)(1), unless the authorization is terminated or revoked.  Performed at [facility], [address] [place], [name] [address]          Radiology Studies: No results found.       Scheduled Meds:  ARIPiprazole  5 mg Oral Daily   budesonide (PULMICORT) nebulizer solution  0.25 mg Nebulization BID   carvedilol  12.5 mg Oral BID WC   enoxaparin (LOVENOX) injection  40 mg Subcutaneous Q24H   gabapentin  600 mg Oral TID   ipratropium-albuterol  3 mL Nebulization Q6H   isosorbide-hydrALAZINE  1 tablet Oral TID   methylPREDNISolone (SOLU-MEDROL) injection  40 mg Intravenous Q12H   pantoprazole  40 mg Oral Daily   [START ON 01/18/2024] torsemide  40 mg Oral Daily   Continuous Infusions:   LOS: 3 days    Time spent: 35 minutes.    Berton Mount, MD  [facility] Pager #: [phone] 7PM-7AM contact night coverage as above

## 2024-01-17 NOTE — Progress Notes (Signed)
Mobility Specialist Progress Note:    01/17/24 1408  Mobility  Activity Ambulated with assistance in hallway  Level of Assistance Standby assist, set-up cues, supervision of patient - no hands on  Assistive Device Wheelchair  Distance Ambulated (ft) 1000 ft (propelled via w/c)  Activity Response Tolerated well  Mobility Referral Yes  Mobility visit 1 Mobility  Mobility Specialist Start Time (ACUTE ONLY) 1200  Mobility Specialist Stop Time (ACUTE ONLY) 1222  Mobility Specialist Time Calculation (min) (ACUTE ONLY) 22 min   Received pt in w/c wheeling around room having no complaints and agreeable to mobility. Requested to ambulate off unit. Pt was asymptomatic throughout ambulation and returned to unit w/o fault. Left in w/c in room w/ call bell in reach and all needs met.   Thompson Grayer Mobility Specialist  Please contact vis Secure Chat or  Rehab Office [phone]

## 2024-01-17 NOTE — Plan of Care (Signed)
  Problem: Education: Goal: Knowledge of General Education information will improve Description: Including pain rating scale, medication(s)/side effects and non-pharmacologic comfort measures Outcome: Progressing   Problem: Health Behavior/Discharge Planning: Goal: Ability to manage health-related needs will improve Outcome: Progressing   Problem: Clinical Measurements: Goal: Ability to maintain clinical measurements within normal limits will improve Outcome: Progressing Goal: Will remain free from infection Outcome: Progressing Goal: Diagnostic test results will improve Outcome: Progressing Goal: Respiratory complications will improve Outcome: Progressing Goal: Cardiovascular complication will be avoided Outcome: Progressing   Problem: Activity: Goal: Risk for activity intolerance will decrease Outcome: Progressing   Problem: Nutrition: Goal: Adequate nutrition will be maintained Outcome: Progressing   Problem: Coping: Goal: Level of anxiety will decrease Outcome: Progressing   Problem: Elimination: Goal: Will not experience complications related to bowel motility Outcome: Progressing Goal: Will not experience complications related to urinary retention Outcome: Progressing   Problem: Pain Managment: Goal: General experience of comfort will improve and/or be controlled Outcome: Progressing   Problem: Safety: Goal: Ability to remain free from injury will improve Outcome: Progressing   Problem: Skin Integrity: Goal: Risk for impaired skin integrity will decrease Outcome: Progressing   Problem: Education: Goal: Ability to demonstrate management of disease process will improve Outcome: Progressing Goal: Ability to verbalize understanding of medication therapies will improve Outcome: Progressing Goal: Individualized Educational Video(s) Outcome: Progressing   Problem: Activity: Goal: Capacity to carry out activities will improve Outcome: Progressing    Problem: Cardiac: Goal: Ability to achieve and maintain adequate cardiopulmonary perfusion will improve Outcome: Progressing

## 2024-01-18 ENCOUNTER — Other Ambulatory Visit (HOSPITAL_COMMUNITY): Payer: Self-pay

## 2024-01-18 ENCOUNTER — Telehealth (HOSPITAL_COMMUNITY): Payer: Self-pay | Admitting: Pharmacy Technician

## 2024-01-18 DIAGNOSIS — I5023 Acute on chronic systolic (congestive) heart failure: Secondary | ICD-10-CM | POA: Diagnosis not present

## 2024-01-18 MED ORDER — NICOTINE 21 MG/24HR TD PT24
21.0000 mg | MEDICATED_PATCH | Freq: Every day | TRANSDERMAL | 0 refills | Status: DC | PRN
  Filled 2024-01-18: qty 28, 28d supply, fill #0

## 2024-01-18 MED ORDER — PREDNISONE 10 MG PO TABS
ORAL_TABLET | ORAL | 0 refills | Status: DC
  Filled 2024-01-18: qty 48, 15d supply, fill #0

## 2024-01-18 MED ORDER — PANTOPRAZOLE SODIUM 40 MG PO TBEC
40.0000 mg | DELAYED_RELEASE_TABLET | Freq: Every day | ORAL | 1 refills | Status: DC
  Filled 2024-01-18: qty 30, 30d supply, fill #0

## 2024-01-18 MED ORDER — OXYCODONE HCL 5 MG PO TABS
5.0000 mg | ORAL_TABLET | Freq: Three times a day (TID) | ORAL | 0 refills | Status: DC | PRN
  Filled 2024-01-18: qty 25, 9d supply, fill #0

## 2024-01-18 MED ORDER — TRELEGY ELLIPTA 100-62.5-25 MCG/ACT IN AEPB
1.0000 | INHALATION_SPRAY | Freq: Every day | RESPIRATORY_TRACT | 1 refills | Status: DC
  Filled 2024-01-18: qty 60, 30d supply, fill #0

## 2024-01-18 MED ORDER — GABAPENTIN 300 MG PO CAPS
600.0000 mg | ORAL_CAPSULE | Freq: Three times a day (TID) | ORAL | 1 refills | Status: DC
Start: 2024-01-18 — End: 2024-08-17
  Filled 2024-01-18: qty 180, 30d supply, fill #0

## 2024-01-18 MED ORDER — CARVEDILOL 12.5 MG PO TABS
12.5000 mg | ORAL_TABLET | Freq: Two times a day (BID) | ORAL | 1 refills | Status: DC
  Filled 2024-01-18: qty 60, 30d supply, fill #0

## 2024-01-18 MED ORDER — TORSEMIDE 20 MG PO TABS
40.0000 mg | ORAL_TABLET | Freq: Every day | ORAL | 1 refills | Status: DC
  Filled 2024-01-18: qty 60, 30d supply, fill #0

## 2024-01-18 MED ORDER — ISOSORB DINITRATE-HYDRALAZINE 20-37.5 MG PO TABS
1.0000 | ORAL_TABLET | Freq: Three times a day (TID) | ORAL | 1 refills | Status: DC
  Filled 2024-01-18: qty 90, 30d supply, fill #0

## 2024-01-18 MED ORDER — ALBUTEROL SULFATE HFA 108 (90 BASE) MCG/ACT IN AERS
2.0000 | INHALATION_SPRAY | RESPIRATORY_TRACT | 0 refills | Status: DC | PRN
  Filled 2024-01-18: qty 18, 30d supply, fill #0

## 2024-01-18 MED ORDER — ARIPIPRAZOLE 5 MG PO TABS
5.0000 mg | ORAL_TABLET | Freq: Every day | ORAL | 1 refills | Status: DC
  Filled 2024-01-18: qty 30, 30d supply, fill #0

## 2024-01-18 NOTE — Progress Notes (Signed)
Mobility Specialist Progress Note:   01/18/24 0956  Mobility  Activity  (propel in w/c)  Level of Assistance Modified independent, requires aide device or extra time  Assistive Device Wheelchair  Distance Ambulated (ft) 500 ft  Activity Response Tolerated well  Mobility Referral Yes  Mobility visit 1 Mobility  Mobility Specialist Start Time (ACUTE ONLY) [id number]  Mobility Specialist Stop Time (ACUTE ONLY) 1015  Mobility Specialist Time Calculation (min) (ACUTE ONLY) 19 min   Pt agreeable to mobility session, propelled in w/c off unit. No c/o throughout. Pt back in room with all needs met, eager for d/c.  [name] Mobility Specialist Please contact via SecureChat or  Rehab office at [phone]

## 2024-01-18 NOTE — Discharge Summary (Signed)
Physician Discharge Summary  Patient ID: Sharod Petsch MRN: [id number] DOB/AGE: Jun 04, 1966 58 y.o.  Admit date: 01/13/2024 Discharge date: 01/18/2024  Admission Diagnoses:  Discharge Diagnoses:  Active Problems:   CHF exacerbation [facility])   Discharged Condition: stable  Hospital Course:  Patient is a 58 year old African-American male, with past medical history significant for Asthma, COPD, GERD, HTN , prolonged QT, Depression, Schizoaffective d/o, CHFref, , DMII, s/p b/l lower extremity amputee, chronic pain, and polysubstance abuse.  Patient was recently discharged from the hospital.  Patient was readmitted with shortness of breath/acute on chronic CHF with reduced EF (EF of less than 20%), asthma/COPD and uncontrolled hypertension.  Patient was managed with IV diuretics, IV steroids and nebulizer treatment.  Patient has improved significantly.  Ejection fraction is less than 20%.  Patient will follow-up primary care provider, pulmonary team and cardiology team on discharge.  Acute CHF exacerbation with rEF -Estimated left ventricular ejection fraction of less than 20%.  -Patient will follow-up with cardiology team on discharge. -Patient continues to use cocaine. -Diuretics were changed to IV during the hospital stay. -Patient will be discharged on torsemide 40 Mg p.o. once daily.   Asthma/ COPD with exacerbation: -Managed with IV steroids, nebs DuoNeb and Pulmicort. -Patient will be discharged on tapering dose of prednisone. -Symptoms have resolved. -Follow-up with pulmonary team on discharge.   GERD -ppi   Uncontrolled hypertension:    -Continue to monitor and optimize. -Goal blood pressure should be less than 130/80 mmHg.   Depression Schizoaffective d/o   DMII -Most recent A1c 5.8   B/l lower extremity amputee Status post bilateral BKA.     Chronic pain  -continue the use of gabapentin, oxycodone and muscle relaxants -need to f/u with pain clinic as outpatient     Polysubstance abuse: -Toxicology was positive for cocaine.   Consults: None  Significant Diagnostic Studies:    Discharge Exam: Blood pressure (!) 154/99, pulse 90, temperature 97.9 F (36.6 C), temperature source Oral, resp. rate 20, height 5\' 9"  (1.753 m), weight 78.1 kg, SpO2 98%.   Disposition: Discharge disposition: 01-Home or Self Care       Discharge Instructions     Diet - low sodium heart healthy   Complete by: As directed    Increase activity slowly   Complete by: As directed       Allergies as of 01/18/2024       Reactions   Acetaminophen Hives, Nausea And Vomiting, Rash, Anaphylaxis   PT states that if he takes this he is going to the hospital   Motrin [ibuprofen] Anaphylaxis   Nsaids Anaphylaxis   PT states that if he takes this he is going to the hospital   Lyrica Cr [pregabalin Er] Other (See Comments)   Dystonia Dysarthria    Peanut-containing Drug Products Other (See Comments)   Pt denies allergy to peanut butter 03/02/2021        Medication List     STOP taking these medications    hydrOXYzine 10 MG tablet Commonly known as: ATARAX       TAKE these medications    albuterol 108 (90 Base) MCG/ACT inhaler Commonly known as: Ventolin HFA Inhale 2 puffs into the lungs every 4 (four) hours as needed for wheezing or shortness of breath. What changed: See the new instructions.   ARIPiprazole 5 MG tablet Commonly known as: ABILIFY Take 1 tablet (5 mg total) by mouth daily.   carvedilol 12.5 MG tablet Commonly known as: COREG Take 1 tablet (12.5  mg total) by mouth 2 (two) times daily with a meal.   gabapentin 300 MG capsule Commonly known as: NEURONTIN Take 2 capsules (600 mg total) by mouth 3 (three) times daily.   isosorbide-hydrALAZINE 20-37.5 MG tablet Commonly known as: BIDIL Take 1 tablet by mouth 3 (three) times daily.   nicotine 21 mg/24hr patch Commonly known as: NICODERM CQ - dosed in mg/24 hours Place 1 patch (21 mg  total) onto the skin daily as needed (Tobacco craving).   oxyCODONE 5 MG immediate release tablet Commonly known as: Roxicodone Take 1 tablet (5 mg total) by mouth every 8 (eight) hours as needed for moderate pain (pain score 4-6) or breakthrough pain. What changed: Another medication with the same name was removed. Continue taking this medication, and follow the directions you see here.   pantoprazole 40 MG tablet Commonly known as: PROTONIX Take 1 tablet (40 mg total) by mouth daily.   predniSONE 10 MG tablet Commonly known as: DELTASONE Prednisone 60 Mg p.o. once daily for 3 days, then 40 Mg p.o. once daily for 3 days, then 30 Mg p.o. once daily for 3 days, then 20 Mg p.o. once daily for 3 days, then 10 Mg p.o. once daily for 3 days and stop.   torsemide 20 MG tablet Commonly known as: DEMADEX Take 2 tablets (40 mg total) by mouth daily.   Trelegy Ellipta 100-62.5-25 MCG/ACT Aepb Generic drug: Fluticasone-Umeclidin-Vilant Inhale 1 puff into the lungs daily.       Time spent: 35 minutes.  SignedBarnetta Chapel 01/18/2024, 11:46 AM

## 2024-01-18 NOTE — Telephone Encounter (Signed)
Pharmacy Patient Advocate Encounter   Received notification that prior authorization for ARIPiprazole 5MG  tablets is required/requested.   Insurance verification completed.   The patient is insured through [employer] .   Per test claim: PA required; PA submitted to above mentioned insurance via CoverMyMeds Key/confirmation #/EOC [id number] Status is pending

## 2024-01-18 NOTE — Plan of Care (Signed)
  Problem: Education: Goal: Knowledge of General Education information will improve Description: Including pain rating scale, medication(s)/side effects and non-pharmacologic comfort measures Outcome: Adequate for Discharge   Problem: Health Behavior/Discharge Planning: Goal: Ability to manage health-related needs will improve Outcome: Adequate for Discharge   Problem: Clinical Measurements: Goal: Ability to maintain clinical measurements within normal limits will improve Outcome: Adequate for Discharge Goal: Will remain free from infection Outcome: Adequate for Discharge Goal: Diagnostic test results will improve Outcome: Adequate for Discharge Goal: Respiratory complications will improve Outcome: Adequate for Discharge Goal: Cardiovascular complication will be avoided Outcome: Adequate for Discharge   Problem: Activity: Goal: Risk for activity intolerance will decrease Outcome: Adequate for Discharge   Problem: Nutrition: Goal: Adequate nutrition will be maintained Outcome: Adequate for Discharge   Problem: Coping: Goal: Level of anxiety will decrease Outcome: Adequate for Discharge   Problem: Elimination: Goal: Will not experience complications related to bowel motility Outcome: Adequate for Discharge Goal: Will not experience complications related to urinary retention Outcome: Adequate for Discharge   Problem: Pain Managment: Goal: General experience of comfort will improve and/or be controlled Outcome: Adequate for Discharge   Problem: Safety: Goal: Ability to remain free from injury will improve Outcome: Adequate for Discharge   Problem: Skin Integrity: Goal: Risk for impaired skin integrity will decrease Outcome: Adequate for Discharge   Problem: Education: Goal: Ability to demonstrate management of disease process will improve Outcome: Adequate for Discharge Goal: Ability to verbalize understanding of medication therapies will improve Outcome: Adequate  for Discharge Goal: Individualized Educational Video(s) Outcome: Adequate for Discharge   Problem: Activity: Goal: Capacity to carry out activities will improve Outcome: Adequate for Discharge   Problem: Cardiac: Goal: Ability to achieve and maintain adequate cardiopulmonary perfusion will improve Outcome: Adequate for Discharge

## 2024-01-18 NOTE — Progress Notes (Signed)
 Discharge instructions (including medications) discussed with and copy provided to patient/caregiver

## 2024-01-18 NOTE — Telephone Encounter (Signed)
Pharmacy Patient Advocate Encounter  Received notification from [facility] that Prior Authorization for ARIPiprazole 5MG  tablets  has been APPROVED from 01/18/2024 to 01/17/2025   PA #/Case ID/Reference #: [id number]

## 2024-01-18 NOTE — Telephone Encounter (Signed)
Pharmacy Patient Advocate Encounter  Received notification from [facility] that Prior Authorization for Trelegy Ellipta 100-62.5-25MCG/ACT aerosol powder  has been APPROVED from 01/18/2024 to 01/17/2025   PA #/Case ID/Reference #: [id number]

## 2024-01-18 NOTE — TOC Transition Note (Addendum)
Transition of Care [facility]) - Discharge Note   Patient Details  Name: [name] MRN: [id number] Date of Birth: Aug 16, 1966  Transition of Care [facility]) CM/SW Contact:  Leone Haven, RN Phone Number: 01/18/2024, 12:21 PM   Clinical Narrative:    For dc today, ptar has been scheduled.  Awaiting prior auth for meDication per pharmacy.     Barriers to Discharge: Continued Medical Work up   Patient Goals and CMS Choice Patient states their goals for this hospitalization and ongoing recovery are:: Home          Discharge Placement                       Discharge Plan and Services Additional resources added to the After Visit Summary for                    DME Agency: NA                  Social Drivers of Health (SDOH) Interventions SDOH Screenings   Food Insecurity: Patient Declined (01/14/2024)  Housing: Patient Declined (01/14/2024)  Transportation Needs: Patient Declined (01/14/2024)  Utilities: Patient Declined (01/14/2024)  Alcohol Screen: Medium Risk (06/24/2019)  Depression (PHQ2-9): Medium Risk (07/28/2023)  Financial Resource Strain: High Risk (07/24/2021)   Received from [facility]  Social Connections: Unknown (04/05/2022)   Received from [facility], [facility]  Stress: Stress Concern Present (07/24/2021)   Received from [facility], [facility]  Tobacco Use: High Risk (01/13/2024)     Readmission Risk Interventions    12/21/2023    9:25 AM 12/09/2022    9:02 AM 12/01/2022    3:56 PM  Readmission Risk Prevention Plan  Transportation Screening Complete Complete Complete  HRI or Home Care Consult Complete    Social Work Consult for Recovery Care Planning/Counseling Complete    Palliative Care Screening Not Applicable    Medication Review Oceanographer) Complete Complete Complete  PCP or Specialist appointment within 3-5 days of discharge  Complete Complete  HRI or Home Care Consult  Complete Complete  SW  Recovery Care/Counseling Consult  Complete Complete  Palliative Care Screening  Not Applicable Not Applicable  Skilled Nursing Facility  Not Applicable Not Applicable  SNF Comments   n/a

## 2024-01-18 NOTE — Telephone Encounter (Signed)
Pharmacy Patient Advocate Encounter   Received notification that prior authorization for Trelegy Ellipta 100-62.5-25MCG/ACT aerosol powder is required/requested.   Insurance verification completed.   The patient is insured through [facility] .   Per test claim: PA required; PA submitted to above mentioned insurance via CoverMyMeds Key/confirmation #/EOC [facility] Status is pending

## 2024-01-18 NOTE — TOC Progression Note (Signed)
Transition of Care [facility]) - Progression Note    Patient Details  Name: [name] MRN: [id number] Date of Birth: [birth year]  Transition of Care [facility]) CM/SW Contact  Leone Haven, RN Phone Number: 01/18/2024, 11:56 AM  Clinical Narrative:     Patient left wheelchair at home ,came in by ambulance,he will need ambulance to go home.  Expected Discharge Plan: Home/Self Care Barriers to Discharge: Continued Medical Work up  Expected Discharge Plan and Services       Living arrangements for the past 2 months: Apartment Expected Discharge Date: 01/18/24                 DME Agency: NA                   Social Determinants of Health (SDOH) Interventions SDOH Screenings   Food Insecurity: Patient Declined (01/14/2024)  Housing: Patient Declined (01/14/2024)  Transportation Needs: Patient Declined (01/14/2024)  Utilities: Patient Declined (01/14/2024)  Alcohol Screen: Medium Risk (06/24/2019)  Depression (PHQ2-9): Medium Risk (07/28/2023)  Financial Resource Strain: High Risk (07/24/2021)   Received from [facility], [facility]  Social Connections: Unknown (04/05/2022)   Received from [facility], [facility]  Stress: Stress Concern Present (07/24/2021)   Received from [facility], [facility]  Tobacco Use: High Risk (01/13/2024)    Readmission Risk Interventions    12/21/2023    9:25 AM 12/09/2022    9:02 AM 12/01/2022    3:56 PM  Readmission Risk Prevention Plan  Transportation Screening Complete Complete Complete  HRI or Home Care Consult Complete    Social Work Consult for Recovery Care Planning/Counseling Complete    Palliative Care Screening Not Applicable    Medication Review Oceanographer) Complete Complete Complete  PCP or Specialist appointment within 3-5 days of discharge  Complete Complete  HRI or Home Care Consult  Complete Complete  SW Recovery Care/Counseling Consult  Complete Complete  Palliative Care Screening  Not Applicable Not  Applicable  Skilled Nursing Facility  Not Applicable Not Applicable  SNF Comments   n/a

## 2024-01-19 ENCOUNTER — Telehealth: Payer: Self-pay

## 2024-01-19 NOTE — Transitions of Care (Post Inpatient/ED Visit) (Signed)
   01/19/2024  Name: [name] MRN: [id number] DOB: 1966/08/28  Today's TOC FU Call Status: Unsuccessful Call (1st Attempt) Date: 01/19/24  Attempted to reach the patient regarding the most recent Inpatient/ED visit.  Follow Up Plan: Additional outreach attempts will be made to reach the patient to complete the Transitions of Care (Post Inpatient/ED visit) call.   The patient has an appointment with Dr Andrey Campanile at [facility] on 01/21/2024.   Signature  Robyne Peers, RN

## 2024-01-20 ENCOUNTER — Telehealth: Payer: Self-pay

## 2024-01-20 NOTE — Transitions of Care (Post Inpatient/ED Visit) (Signed)
   01/20/2024  Name: [name] MRN: [id number] DOB: July 26, 1966  Today's TOC FU Call Status: Today's TOC FU Call Status:: Unsuccessful Call (2nd Attempt) Unsuccessful Call (1st Attempt) Date: 01/19/24 Unsuccessful Call (2nd Attempt) Date: 01/20/24  Attempted to reach the patient regarding the most recent Inpatient/ED visit.  Follow Up Plan: Additional outreach attempts will be made to reach the patient to complete the Transitions of Care (Post Inpatient/ED visit) call.    The patient has an appointment with Dr Andrey Campanile at [facility] tomorrow, 01/21/2024.   Signature Robyne Peers, RN

## 2024-01-21 ENCOUNTER — Telehealth: Payer: Self-pay

## 2024-01-21 ENCOUNTER — Ambulatory Visit: Payer: MEDICAID | Admitting: Family Medicine

## 2024-01-21 VITALS — BP 148/108 | HR 92 | Temp 98.1°F | Resp 18

## 2024-01-21 DIAGNOSIS — E1169 Type 2 diabetes mellitus with other specified complication: Secondary | ICD-10-CM | POA: Diagnosis not present

## 2024-01-21 DIAGNOSIS — J449 Chronic obstructive pulmonary disease, unspecified: Secondary | ICD-10-CM

## 2024-01-21 DIAGNOSIS — I10 Essential (primary) hypertension: Secondary | ICD-10-CM | POA: Diagnosis not present

## 2024-01-21 DIAGNOSIS — G894 Chronic pain syndrome: Secondary | ICD-10-CM

## 2024-01-21 DIAGNOSIS — Z89611 Acquired absence of right leg above knee: Secondary | ICD-10-CM

## 2024-01-21 MED ORDER — ALBUTEROL SULFATE 1.25 MG/3ML IN NEBU
1.0000 | INHALATION_SOLUTION | Freq: Four times a day (QID) | RESPIRATORY_TRACT | 12 refills | Status: DC | PRN
Start: 2024-01-21 — End: 2024-08-17

## 2024-01-21 MED ORDER — CARVEDILOL 25 MG PO TABS: 25.0000 mg | ORAL_TABLET | Freq: Two times a day (BID) | ORAL | 3 refills | Status: DC

## 2024-01-21 MED ORDER — BUDESONIDE 0.25 MG/2ML IN SUSP: 0.2500 mg | Freq: Every day | RESPIRATORY_TRACT | 5 refills | Status: DC

## 2024-01-21 MED ORDER — TIOTROPIUM BROMIDE MONOHYDRATE 18 MCG IN CAPS: 18.0000 ug | ORAL_CAPSULE | Freq: Every day | RESPIRATORY_TRACT | 5 refills | Status: DC

## 2024-01-21 NOTE — Progress Notes (Signed)
Patient is here for referral for pain management. Patient has concern  regarding his breathing

## 2024-01-21 NOTE — Transitions of Care (Post Inpatient/ED Visit) (Signed)
   01/21/2024  Name: [name] MRN: [id number] DOB: 09-25-1966  Today's TOC FU Call Status: Today's TOC FU Call Status:: Unsuccessful Call (3rd Attempt) Unsuccessful Call (1st Attempt) Date: 01/19/24 Unsuccessful Call (2nd Attempt) Date: 01/20/24 Unsuccessful Call (3rd Attempt) Date: 01/21/24  Attempted to reach the patient regarding the most recent Inpatient/ED visit.  Follow Up Plan: No further outreach attempts will be made at this time. We have been unable to contact the patient.  The patient has an appointment this afternoon with Dr Andrey Campanile at [facility]. This information was on his AVS when he was discharged from the hospital.   Signature Robyne Peers, RN

## 2024-01-26 ENCOUNTER — Encounter: Payer: Self-pay | Admitting: Family Medicine

## 2024-01-26 NOTE — Progress Notes (Signed)
 Established Patient Office Visit  Subjective    Patient ID: [name], male    DOB: 16-Nov-1966  Age: 58 y.o. MRN: [id number]  CC:  Chief Complaint  Patient presents with   Medical Management of Chronic Issues    [name] presents for routine follow up of chronic med issues including hypertension, diabetes, and COPD.   Outpatient Encounter Medications as of 01/21/2024  Medication Sig   albuterol (ACCUNEB) 1.25 MG/3ML nebulizer solution Take 3 mLs (1.25 mg total) by nebulization every 6 (six) hours as needed for wheezing.   albuterol (VENTOLIN HFA) 108 (90 Base) MCG/ACT inhaler Inhale 2 puffs into the lungs every 4 (four) hours as needed for wheezing or shortness of breath.   ARIPiprazole (ABILIFY) 5 MG tablet Take 1 tablet (5 mg total) by mouth daily.   budesonide (PULMICORT) 0.25 MG/2ML nebulizer solution Take 2 mLs (0.25 mg total) by nebulization daily.   carvedilol (COREG) 12.5 MG tablet Take 1 tablet (12.5 mg total) by mouth 2 (two) times daily with a meal.   carvedilol (COREG) 25 MG tablet Take 1 tablet (25 mg total) by mouth 2 (two) times daily with a meal.   Fluticasone-Umeclidin-Vilant (TRELEGY ELLIPTA) 100-62.5-25 MCG/ACT AEPB Inhale 1 puff into the lungs daily.   gabapentin (NEURONTIN) 300 MG capsule Take 2 capsules (600 mg total) by mouth 3 (three) times daily.   isosorbide-hydrALAZINE (BIDIL) 20-37.5 MG tablet Take 1 tablet by mouth 3 (three) times daily.   nicotine (NICODERM CQ - DOSED IN MG/24 HOURS) 21 mg/24hr patch Place 1 patch (21 mg total) onto the skin daily as needed (Tobacco craving).   oxyCODONE (ROXICODONE) 5 MG immediate release tablet Take 1 tablet (5 mg total) by mouth every 8 (eight) hours as needed for moderate pain (pain score 4-6) or breakthrough pain.   pantoprazole (PROTONIX) 40 MG tablet Take 1 tablet (40 mg total) by mouth daily.   predniSONE (DELTASONE) 10 MG tablet Take 60 mg (6tabs) by mouth once daily for 3 days, then 40mg  (4tabs)once  daily for 3 days, then 30mg  (3tabs) once daily for 3 days, then 20mg  (2tabs) once daily for 3 days, then 10mg  (1tab) once daily for 3 days and stop.   tiotropium (SPIRIVA HANDIHALER) 18 MCG inhalation capsule Place 1 capsule (18 mcg total) into inhaler and inhale daily.   torsemide (DEMADEX) 20 MG tablet Take 2 tablets (40 mg total) by mouth daily.   No facility-administered encounter medications on file as of 01/21/2024.    Past Medical History:  Diagnosis Date   Asthma    Bipolar 1 disorder (HCC)    CHF (congestive heart failure) (HCC)    COPD (chronic obstructive pulmonary disease) (HCC)    Depression    Diabetes mellitus without complication (HCC)    GERD (gastroesophageal reflux disease)    Homelessness    Hypertension    QT prolongation    Schizoaffective disorder (HCC)     Past Surgical History:  Procedure Laterality Date   AMPUTATION Left 12/10/2022   Procedure: LEFT BELOW KNEE AMPUTATION;  Surgeon: Nadara Mustard, MD;  Location: [facility] OR;  Service: Orthopedics;  Laterality: Left;   AMPUTATION TOE Bilateral 01/02/2021   Procedure: AMPUTATION TOE;  Surgeon: Park Liter, DPM;  Location: [facility] OR;  Service: Podiatry;  Laterality: Bilateral;  Ok to leave patient on hospital bed   IR ANGIO INTRA EXTRACRAN SEL COM CAROTID INNOMINATE BILAT MOD SED  12/17/2020   IR ANGIO VERTEBRAL SEL VERTEBRAL BILAT MOD SED  12/17/2020   MULTIPLE EXTRACTIONS WITH ALVEOLOPLASTY N/A 03/25/2023   Procedure: IRRIGATION AND DEBRIDEMENT, MULTIPLE EXTRACTION WITH ALVEOLOPLASTY X 4 , EXTRACTIONS 4 THROUGH 13, 20 THROUGHT 29;  Surgeon: Vivia Ewing, DMD;  Location: [facility] OR;  Service: Oral Surgery;  Laterality: N/A;   RADIOLOGY WITH ANESTHESIA N/A 12/17/2020   Procedure: IR WITH ANESTHESIA;  Surgeon: Radiologist, Medication, MD;  Location: [facility] OR;  Service: Radiology;  Laterality: N/A;    Family History  Problem Relation Age of Onset   Drug abuse Mother    Suicidality Mother     Social History   Socioeconomic  History   Marital status: Single    Spouse name: Not on file   Number of children: 4   Years of education: Not on file   Highest education level: Not on file  Occupational History   Occupation: N/A  Tobacco Use   Smoking status: Some Days    Types: Cigarettes   Smokeless tobacco: Never  Vaping Use   Vaping status: Never Used  Substance and Sexual Activity   Alcohol use: Yes   Drug use: Yes    Types: Cocaine   Sexual activity: Not Currently    Partners: Female    Birth control/protection: None  Other Topics Concern   Not on file  Social History Narrative   ** Merged History Encounter **       Social Drivers of Health   Financial Resource Strain: High Risk (07/24/2021)   Received from [facility], [facility]   Overall Financial Resource Strain (CARDIA)    Difficulty of Paying Living Expenses: Hard  Food Insecurity: Food Insecurity Present (01/21/2024)   Hunger Vital Sign    Worried About Running Out of Food in the Last Year: Sometimes true    Ran Out of Food in the Last Year: Sometimes true  Transportation Needs: No Transportation Needs (01/21/2024)   PRAPARE - Administrator, Civil Service (Medical): No    Lack of Transportation (Non-Medical): No  Physical Activity: Not on file  Stress: Stress Concern Present (07/24/2021)   Received from [facility], [facility] of Occupational Health - Occupational Stress Questionnaire    Feeling of Stress : To some extent  Social Connections: Unknown (04/05/2022)   Received from [facility], [facility]   Social Network    Social Network: Not on file  Intimate Partner Violence: Patient Declined (01/14/2024)   Humiliation, Afraid, Rape, and Kick questionnaire    Fear of Current or Ex-Partner: Patient declined    Emotionally Abused: Patient declined    Physically Abused: Patient declined    Sexually Abused: Patient declined    Review of Systems  All other systems reviewed and are  negative.       Objective    BP (!) 148/108   Pulse 92   Temp 98.1 F (36.7 C) (Oral)   Resp 18   SpO2 97%   Physical Exam Vitals and nursing note reviewed.  Constitutional:      General: He is not in acute distress. Cardiovascular:     Rate and Rhythm: Normal rate and regular rhythm.  Pulmonary:     Effort: Pulmonary effort is normal.     Breath sounds: Normal breath sounds.  Abdominal:     Palpations: Abdomen is soft.     Tenderness: There is no abdominal tenderness.  Musculoskeletal:     Comments: Utilizing wheelchair     Right Lower Extremity: Right leg is amputated above knee.  Left Lower Extremity: Left leg is amputated below knee.  Neurological:     General: No focal deficit present.     Mental Status: He is alert and oriented to person, place, and time.  Psychiatric:        Mood and Affect: Mood and affect normal.         Assessment & Plan:  1. Chronic obstructive pulmonary disease, unspecified COPD type (HCC) (Primary) Spiriva, and albuterol/pulmicort for nebulizer prescribed.   2. Essential hypertension Elevated readings. Will increase coreg from 12.5 to 25 mg daily  3. Type 2 diabetes mellitus with other specified complication, unspecified whether long term insulin use (HCC) Recent A1c at goal. Continue   4. Chronic pain syndrome  - Ambulatory referral to Pain Clinic  5. S/P AKA (above knee amputation), right (HCC)      Return in about 3 months (around 04/19/2024) for chronic med issues.   Tommie Raymond, MD

## 2024-02-18 ENCOUNTER — Ambulatory Visit (INDEPENDENT_AMBULATORY_CARE_PROVIDER_SITE_OTHER): Payer: MEDICAID | Admitting: Family Medicine

## 2024-02-18 ENCOUNTER — Encounter: Payer: Self-pay | Admitting: Family Medicine

## 2024-02-18 VITALS — BP 154/101 | HR 90 | Temp 97.5°F | Resp 20

## 2024-02-18 DIAGNOSIS — G894 Chronic pain syndrome: Secondary | ICD-10-CM | POA: Diagnosis not present

## 2024-02-18 DIAGNOSIS — F17200 Nicotine dependence, unspecified, uncomplicated: Secondary | ICD-10-CM | POA: Diagnosis not present

## 2024-02-18 DIAGNOSIS — I10 Essential (primary) hypertension: Secondary | ICD-10-CM

## 2024-02-18 NOTE — Progress Notes (Signed)
 Established Patient Office Visit  Subjective    Patient ID: [name], male    DOB: 07/18/1966  Age: 58 y.o. MRN: [id number]  CC:  Chief Complaint  Patient presents with   Follow-up    One month, has  not seen the pain clinic MD    [name] presents with complaint of continued pain. He has not scheduled his consult with pain mgt as of yet.   Outpatient Encounter Medications as of 02/18/2024  Medication Sig   albuterol (ACCUNEB) 1.25 MG/3ML nebulizer solution Take 3 mLs (1.25 mg total) by nebulization every 6 (six) hours as needed for wheezing.   albuterol (VENTOLIN HFA) 108 (90 Base) MCG/ACT inhaler Inhale 2 puffs into the lungs every 4 (four) hours as needed for wheezing or shortness of breath.   ARIPiprazole (ABILIFY) 5 MG tablet Take 1 tablet (5 mg total) by mouth daily.   budesonide (PULMICORT) 0.25 MG/2ML nebulizer solution Take 2 mLs (0.25 mg total) by nebulization daily.   carvedilol (COREG) 25 MG tablet Take 1 tablet (25 mg total) by mouth 2 (two) times daily with a meal.   Fluticasone-Umeclidin-Vilant (TRELEGY ELLIPTA) 100-62.5-25 MCG/ACT AEPB Inhale 1 puff into the lungs daily.   gabapentin (NEURONTIN) 300 MG capsule Take 2 capsules (600 mg total) by mouth 3 (three) times daily.   isosorbide-hydrALAZINE (BIDIL) 20-37.5 MG tablet Take 1 tablet by mouth 3 (three) times daily.   nicotine (NICODERM CQ - DOSED IN MG/24 HOURS) 21 mg/24hr patch Place 1 patch (21 mg total) onto the skin daily as needed (Tobacco craving).   oxyCODONE (ROXICODONE) 5 MG immediate release tablet Take 1 tablet (5 mg total) by mouth every 8 (eight) hours as needed for moderate pain (pain score 4-6) or breakthrough pain.   pantoprazole (PROTONIX) 40 MG tablet Take 1 tablet (40 mg total) by mouth daily.   predniSONE (DELTASONE) 10 MG tablet Take 60 mg (6tabs) by mouth once daily for 3 days, then 40mg  (4tabs)once daily for 3 days, then 30mg  (3tabs) once daily for 3 days, then 20mg  (2tabs) once  daily for 3 days, then 10mg  (1tab) once daily for 3 days and stop.   tiotropium (SPIRIVA HANDIHALER) 18 MCG inhalation capsule Place 1 capsule (18 mcg total) into inhaler and inhale daily.   torsemide (DEMADEX) 20 MG tablet Take 2 tablets (40 mg total) by mouth daily.   carvedilol (COREG) 12.5 MG tablet Take 1 tablet (12.5 mg total) by mouth 2 (two) times daily with a meal.   No facility-administered encounter medications on file as of 02/18/2024.    Past Medical History:  Diagnosis Date   Asthma    Bipolar 1 disorder (HCC)    CHF (congestive heart failure) (HCC)    COPD (chronic obstructive pulmonary disease) (HCC)    Depression    Diabetes mellitus without complication (HCC)    GERD (gastroesophageal reflux disease)    Homelessness    Hypertension    QT prolongation    Schizoaffective disorder (HCC)     Past Surgical History:  Procedure Laterality Date   AMPUTATION Left 12/10/2022   Procedure: LEFT BELOW KNEE AMPUTATION;  Surgeon: Nadara Mustard, MD;  Location: [facility] OR;  Service: Orthopedics;  Laterality: Left;   AMPUTATION TOE Bilateral 01/02/2021   Procedure: AMPUTATION TOE;  Surgeon: Park Liter, DPM;  Location: [facility] OR;  Service: Podiatry;  Laterality: Bilateral;  Ok to leave patient on hospital bed   IR ANGIO INTRA EXTRACRAN SEL COM CAROTID INNOMINATE BILAT MOD SED  12/17/2020   IR ANGIO VERTEBRAL SEL VERTEBRAL BILAT MOD SED  12/17/2020   MULTIPLE EXTRACTIONS WITH ALVEOLOPLASTY N/A 03/25/2023   Procedure: IRRIGATION AND DEBRIDEMENT, MULTIPLE EXTRACTION WITH ALVEOLOPLASTY X 4 , EXTRACTIONS 4 THROUGH 13, 20 THROUGHT 29;  Surgeon: Vivia Ewing, DMD;  Location: [facility] OR;  Service: Oral Surgery;  Laterality: N/A;   RADIOLOGY WITH ANESTHESIA N/A 12/17/2020   Procedure: IR WITH ANESTHESIA;  Surgeon: Radiologist, Medication, MD;  Location: [facility] OR;  Service: Radiology;  Laterality: N/A;    Family History  Problem Relation Age of Onset   Drug abuse Mother    Suicidality Mother     Social  History   Socioeconomic History   Marital status: Single    Spouse name: Not on file   Number of children: 4   Years of education: Not on file   Highest education level: Not on file  Occupational History   Occupation: N/A  Tobacco Use   Smoking status: Some Days    Types: Cigarettes   Smokeless tobacco: Never  Vaping Use   Vaping status: Never Used  Substance and Sexual Activity   Alcohol use: Yes   Drug use: Yes    Types: Cocaine   Sexual activity: Not Currently    Partners: Female    Birth control/protection: None  Other Topics Concern   Not on file  Social History Narrative   ** Merged History Encounter **       Social Drivers of Health   Financial Resource Strain: High Risk (07/24/2021)   Received from [facility], [facility]   Overall Financial Resource Strain (CARDIA)    Difficulty of Paying Living Expenses: Hard  Food Insecurity: Food Insecurity Present (01/21/2024)   Hunger Vital Sign    Worried About Running Out of Food in the Last Year: Sometimes true    Ran Out of Food in the Last Year: Sometimes true  Transportation Needs: No Transportation Needs (01/21/2024)   PRAPARE - Administrator, Civil Service (Medical): No    Lack of Transportation (Non-Medical): No  Physical Activity: Inactive (02/18/2024)   Exercise Vital Sign    Days of Exercise per Week: 0 days    Minutes of Exercise per Session: 0 min  Stress: Stress Concern Present (07/24/2021)   Received from [facility], [facility] Occupational [facility] - Occupational Stress Questionnaire    Feeling of Stress : To some extent  Social Connections: Unknown (04/05/2022)   Received from [facility], [facility]   Social Network    Social Network: Not on file  Intimate Partner Violence: Patient Declined (01/14/2024)   Humiliation, Afraid, Rape, and Kick questionnaire    Fear of Current or Ex-Partner: Patient declined    Emotionally Abused: Patient declined     Physically Abused: Patient declined    Sexually Abused: Patient declined    Review of Systems  All other systems reviewed and are negative.       Objective    BP (!) 154/101   Pulse 90   Temp (!) 97.5 F (36.4 C) (Oral)   Resp 20   SpO2 97%   Physical Exam Vitals and nursing note reviewed.  Constitutional:      General: He is not in acute distress. Cardiovascular:     Rate and Rhythm: Normal rate and regular rhythm.  Pulmonary:     Effort: Pulmonary effort is normal.     Breath sounds: Normal breath sounds.  Abdominal:  Palpations: Abdomen is soft.     Tenderness: There is no abdominal tenderness.  Musculoskeletal:     Comments: Utilizing wheelchair     Right Lower Extremity: Right leg is amputated above knee.     Left Lower Extremity: Left leg is amputated below knee.  Neurological:     General: No focal deficit present.     Mental Status: He is alert and oriented to person, place, and time.  Psychiatric:        Mood and Affect: Mood and affect normal.         Assessment & Plan:   1. Chronic pain syndrome (Primary) Patient given info to call to set up appt with consultant  2. Essential hypertension Slightly elevated readings. Patient has not yet taken meds. Discussed compliance. Continue   3. Smoking Discussed reduction/cessation    Return in about 4 weeks (around 03/17/2024) for follow up.   Tommie Raymond, MD

## 2024-03-04 ENCOUNTER — Emergency Department (HOSPITAL_COMMUNITY): Payer: MEDICAID

## 2024-03-04 ENCOUNTER — Inpatient Hospital Stay (HOSPITAL_COMMUNITY)
Admission: EM | Admit: 2024-03-04 | Discharge: 2024-08-17 | DRG: 064 | Disposition: A | Discharge status: Home Health | Payer: MEDICAID | Attending: Internal Medicine | Admitting: Internal Medicine

## 2024-03-04 ENCOUNTER — Inpatient Hospital Stay (HOSPITAL_COMMUNITY): Payer: MEDICAID

## 2024-03-04 ENCOUNTER — Other Ambulatory Visit: Payer: Self-pay

## 2024-03-04 ENCOUNTER — Encounter (HOSPITAL_COMMUNITY): Payer: Self-pay | Admitting: Emergency Medicine

## 2024-03-04 DIAGNOSIS — Z789 Other specified health status: Secondary | ICD-10-CM | POA: Diagnosis not present

## 2024-03-04 DIAGNOSIS — R4701 Aphasia: Secondary | ICD-10-CM | POA: Diagnosis present

## 2024-03-04 DIAGNOSIS — Z7901 Long term (current) use of anticoagulants: Secondary | ICD-10-CM

## 2024-03-04 DIAGNOSIS — I472 Ventricular tachycardia, unspecified: Secondary | ICD-10-CM | POA: Diagnosis not present

## 2024-03-04 DIAGNOSIS — I5020 Unspecified systolic (congestive) heart failure: Secondary | ICD-10-CM

## 2024-03-04 DIAGNOSIS — I5022 Chronic systolic (congestive) heart failure: Secondary | ICD-10-CM

## 2024-03-04 DIAGNOSIS — M545 Low back pain, unspecified: Secondary | ICD-10-CM | POA: Diagnosis not present

## 2024-03-04 DIAGNOSIS — I5023 Acute on chronic systolic (congestive) heart failure: Secondary | ICD-10-CM | POA: Diagnosis not present

## 2024-03-04 DIAGNOSIS — G8191 Hemiplegia, unspecified affecting right dominant side: Secondary | ICD-10-CM | POA: Diagnosis present

## 2024-03-04 DIAGNOSIS — Z7189 Other specified counseling: Secondary | ICD-10-CM | POA: Diagnosis not present

## 2024-03-04 DIAGNOSIS — I82411 Acute embolism and thrombosis of right femoral vein: Secondary | ICD-10-CM | POA: Diagnosis present

## 2024-03-04 DIAGNOSIS — Z5971 Insufficient health insurance coverage: Secondary | ICD-10-CM

## 2024-03-04 DIAGNOSIS — I420 Dilated cardiomyopathy: Secondary | ICD-10-CM

## 2024-03-04 DIAGNOSIS — R451 Restlessness and agitation: Secondary | ICD-10-CM | POA: Diagnosis present

## 2024-03-04 DIAGNOSIS — R079 Chest pain, unspecified: Secondary | ICD-10-CM | POA: Diagnosis not present

## 2024-03-04 DIAGNOSIS — I5043 Acute on chronic combined systolic (congestive) and diastolic (congestive) heart failure: Secondary | ICD-10-CM | POA: Diagnosis not present

## 2024-03-04 DIAGNOSIS — I6502 Occlusion and stenosis of left vertebral artery: Secondary | ICD-10-CM | POA: Diagnosis present

## 2024-03-04 DIAGNOSIS — K219 Gastro-esophageal reflux disease without esophagitis: Secondary | ICD-10-CM | POA: Diagnosis present

## 2024-03-04 DIAGNOSIS — E119 Type 2 diabetes mellitus without complications: Secondary | ICD-10-CM

## 2024-03-04 DIAGNOSIS — I69315 Cognitive social or emotional deficit following cerebral infarction: Secondary | ICD-10-CM | POA: Diagnosis not present

## 2024-03-04 DIAGNOSIS — I10 Essential (primary) hypertension: Secondary | ICD-10-CM

## 2024-03-04 DIAGNOSIS — F259 Schizoaffective disorder, unspecified: Secondary | ICD-10-CM | POA: Diagnosis present

## 2024-03-04 DIAGNOSIS — I639 Cerebral infarction, unspecified: Principal | ICD-10-CM | POA: Diagnosis present

## 2024-03-04 DIAGNOSIS — J439 Emphysema, unspecified: Secondary | ICD-10-CM | POA: Diagnosis present

## 2024-03-04 DIAGNOSIS — Z9981 Dependence on supplemental oxygen: Secondary | ICD-10-CM | POA: Diagnosis not present

## 2024-03-04 DIAGNOSIS — I513 Intracardiac thrombosis, not elsewhere classified: Secondary | ICD-10-CM | POA: Diagnosis present

## 2024-03-04 DIAGNOSIS — Z888 Allergy status to other drugs, medicaments and biological substances status: Secondary | ICD-10-CM

## 2024-03-04 DIAGNOSIS — I427 Cardiomyopathy due to drug and external agent: Secondary | ICD-10-CM | POA: Diagnosis present

## 2024-03-04 DIAGNOSIS — F17210 Nicotine dependence, cigarettes, uncomplicated: Secondary | ICD-10-CM | POA: Diagnosis present

## 2024-03-04 DIAGNOSIS — J4489 Other specified chronic obstructive pulmonary disease: Secondary | ICD-10-CM | POA: Diagnosis present

## 2024-03-04 DIAGNOSIS — Z5900 Homelessness unspecified: Secondary | ICD-10-CM

## 2024-03-04 DIAGNOSIS — R471 Dysarthria and anarthria: Secondary | ICD-10-CM | POA: Diagnosis present

## 2024-03-04 DIAGNOSIS — I63412 Cerebral infarction due to embolism of left middle cerebral artery: Secondary | ICD-10-CM | POA: Diagnosis not present

## 2024-03-04 DIAGNOSIS — I240 Acute coronary thrombosis not resulting in myocardial infarction: Secondary | ICD-10-CM | POA: Diagnosis not present

## 2024-03-04 DIAGNOSIS — I429 Cardiomyopathy, unspecified: Secondary | ICD-10-CM | POA: Diagnosis not present

## 2024-03-04 DIAGNOSIS — M79661 Pain in right lower leg: Secondary | ICD-10-CM | POA: Diagnosis not present

## 2024-03-04 DIAGNOSIS — I2510 Atherosclerotic heart disease of native coronary artery without angina pectoris: Secondary | ICD-10-CM | POA: Diagnosis present

## 2024-03-04 DIAGNOSIS — F99 Mental disorder, not otherwise specified: Secondary | ICD-10-CM | POA: Diagnosis not present

## 2024-03-04 DIAGNOSIS — G9341 Metabolic encephalopathy: Secondary | ICD-10-CM | POA: Diagnosis not present

## 2024-03-04 DIAGNOSIS — I110 Hypertensive heart disease with heart failure: Secondary | ICD-10-CM | POA: Diagnosis present

## 2024-03-04 DIAGNOSIS — R29720 NIHSS score 20: Secondary | ICD-10-CM | POA: Diagnosis present

## 2024-03-04 DIAGNOSIS — F209 Schizophrenia, unspecified: Secondary | ICD-10-CM | POA: Diagnosis present

## 2024-03-04 DIAGNOSIS — Z86718 Personal history of other venous thrombosis and embolism: Secondary | ICD-10-CM | POA: Diagnosis not present

## 2024-03-04 DIAGNOSIS — Z89611 Acquired absence of right leg above knee: Secondary | ICD-10-CM | POA: Diagnosis not present

## 2024-03-04 DIAGNOSIS — F1410 Cocaine abuse, uncomplicated: Secondary | ICD-10-CM | POA: Diagnosis present

## 2024-03-04 DIAGNOSIS — Z515 Encounter for palliative care: Secondary | ICD-10-CM | POA: Diagnosis not present

## 2024-03-04 DIAGNOSIS — I493 Ventricular premature depolarization: Secondary | ICD-10-CM | POA: Diagnosis present

## 2024-03-04 DIAGNOSIS — Z79899 Other long term (current) drug therapy: Secondary | ICD-10-CM

## 2024-03-04 DIAGNOSIS — J9621 Acute and chronic respiratory failure with hypoxia: Secondary | ICD-10-CM | POA: Diagnosis not present

## 2024-03-04 DIAGNOSIS — Z7951 Long term (current) use of inhaled steroids: Secondary | ICD-10-CM

## 2024-03-04 DIAGNOSIS — Z89512 Acquired absence of left leg below knee: Secondary | ICD-10-CM

## 2024-03-04 DIAGNOSIS — G9389 Other specified disorders of brain: Secondary | ICD-10-CM | POA: Diagnosis not present

## 2024-03-04 DIAGNOSIS — E785 Hyperlipidemia, unspecified: Secondary | ICD-10-CM | POA: Diagnosis present

## 2024-03-04 DIAGNOSIS — Z8659 Personal history of other mental and behavioral disorders: Secondary | ICD-10-CM | POA: Diagnosis not present

## 2024-03-04 DIAGNOSIS — E11649 Type 2 diabetes mellitus with hypoglycemia without coma: Secondary | ICD-10-CM | POA: Diagnosis not present

## 2024-03-04 DIAGNOSIS — R9431 Abnormal electrocardiogram [ECG] [EKG]: Secondary | ICD-10-CM | POA: Diagnosis not present

## 2024-03-04 DIAGNOSIS — I63512 Cerebral infarction due to unspecified occlusion or stenosis of left middle cerebral artery: Secondary | ICD-10-CM | POA: Diagnosis not present

## 2024-03-04 DIAGNOSIS — Z886 Allergy status to analgesic agent status: Secondary | ICD-10-CM

## 2024-03-04 DIAGNOSIS — Z9151 Personal history of suicidal behavior: Secondary | ICD-10-CM

## 2024-03-04 DIAGNOSIS — Z91199 Patient's noncompliance with other medical treatment and regimen due to unspecified reason: Secondary | ICD-10-CM | POA: Diagnosis not present

## 2024-03-04 DIAGNOSIS — J449 Chronic obstructive pulmonary disease, unspecified: Secondary | ICD-10-CM | POA: Diagnosis not present

## 2024-03-04 DIAGNOSIS — E1151 Type 2 diabetes mellitus with diabetic peripheral angiopathy without gangrene: Secondary | ICD-10-CM | POA: Diagnosis not present

## 2024-03-04 DIAGNOSIS — R4702 Dysphasia: Secondary | ICD-10-CM | POA: Diagnosis present

## 2024-03-04 DIAGNOSIS — I959 Hypotension, unspecified: Secondary | ICD-10-CM | POA: Diagnosis not present

## 2024-03-04 DIAGNOSIS — F17200 Nicotine dependence, unspecified, uncomplicated: Secondary | ICD-10-CM | POA: Diagnosis present

## 2024-03-04 DIAGNOSIS — Z5320 Procedure and treatment not carried out because of patient's decision for unspecified reasons: Secondary | ICD-10-CM | POA: Diagnosis not present

## 2024-03-04 DIAGNOSIS — F419 Anxiety disorder, unspecified: Secondary | ICD-10-CM | POA: Diagnosis present

## 2024-03-04 DIAGNOSIS — R569 Unspecified convulsions: Secondary | ICD-10-CM | POA: Diagnosis not present

## 2024-03-04 DIAGNOSIS — I69318 Other symptoms and signs involving cognitive functions following cerebral infarction: Secondary | ICD-10-CM | POA: Diagnosis not present

## 2024-03-04 DIAGNOSIS — F250 Schizoaffective disorder, bipolar type: Secondary | ICD-10-CM | POA: Diagnosis present

## 2024-03-04 DIAGNOSIS — Z79891 Long term (current) use of opiate analgesic: Secondary | ICD-10-CM

## 2024-03-04 DIAGNOSIS — J9811 Atelectasis: Secondary | ICD-10-CM | POA: Diagnosis not present

## 2024-03-04 DIAGNOSIS — R1311 Dysphagia, oral phase: Secondary | ICD-10-CM | POA: Diagnosis present

## 2024-03-04 DIAGNOSIS — I6389 Other cerebral infarction: Secondary | ICD-10-CM | POA: Diagnosis not present

## 2024-03-04 DIAGNOSIS — R748 Abnormal levels of other serum enzymes: Secondary | ICD-10-CM | POA: Diagnosis present

## 2024-03-04 DIAGNOSIS — I6521 Occlusion and stenosis of right carotid artery: Secondary | ICD-10-CM | POA: Diagnosis present

## 2024-03-04 DIAGNOSIS — R29810 Facial weakness: Secondary | ICD-10-CM | POA: Diagnosis present

## 2024-03-04 DIAGNOSIS — R42 Dizziness and giddiness: Secondary | ICD-10-CM | POA: Diagnosis not present

## 2024-03-04 DIAGNOSIS — F1420 Cocaine dependence, uncomplicated: Secondary | ICD-10-CM | POA: Diagnosis present

## 2024-03-04 DIAGNOSIS — I69391 Dysphagia following cerebral infarction: Secondary | ICD-10-CM | POA: Diagnosis not present

## 2024-03-04 DIAGNOSIS — Z91148 Patient's other noncompliance with medication regimen for other reason: Secondary | ICD-10-CM

## 2024-03-04 DIAGNOSIS — Z5941 Food insecurity: Secondary | ICD-10-CM

## 2024-03-04 DIAGNOSIS — Z556 Problems related to health literacy: Secondary | ICD-10-CM | POA: Diagnosis not present

## 2024-03-04 DIAGNOSIS — R4781 Slurred speech: Secondary | ICD-10-CM | POA: Diagnosis present

## 2024-03-04 DIAGNOSIS — R350 Frequency of micturition: Secondary | ICD-10-CM | POA: Diagnosis not present

## 2024-03-04 DIAGNOSIS — Z91010 Allergy to peanuts: Secondary | ICD-10-CM

## 2024-03-04 DIAGNOSIS — G8929 Other chronic pain: Secondary | ICD-10-CM | POA: Diagnosis present

## 2024-03-04 DIAGNOSIS — Z813 Family history of other psychoactive substance abuse and dependence: Secondary | ICD-10-CM

## 2024-03-04 LAB — COMPREHENSIVE METABOLIC PANEL WITH GFR
ALT: 87 U/L — ABNORMAL HIGH (ref 0–44)
AST: 59 U/L — ABNORMAL HIGH (ref 15–41)
Albumin: 2.9 g/dL — ABNORMAL LOW (ref 3.5–5.0)
Alkaline Phosphatase: 67 U/L (ref 38–126)
Anion gap: 11 (ref 5–15)
BUN: 17 mg/dL (ref 6–20)
CO2: 19 mmol/L — ABNORMAL LOW (ref 22–32)
Calcium: 7.9 mg/dL — ABNORMAL LOW (ref 8.9–10.3)
Chloride: 108 mmol/L (ref 98–111)
Creatinine, Ser: 1.04 mg/dL (ref 0.61–1.24)
GFR, Estimated: >60 mL/min (ref >60)
Glucose, Bld: 136 mg/dL — ABNORMAL HIGH (ref 70–99)
Potassium: 3.7 mmol/L (ref 3.5–5.1)
Sodium: 138 mmol/L (ref 135–145)
Total Bilirubin: 2.4 mg/dL — ABNORMAL HIGH (ref 0.0–1.2)
Total Protein: 5.7 g/dL — ABNORMAL LOW (ref 6.5–8.1)

## 2024-03-04 LAB — I-STAT CHEM 8, ED
BUN: 19 mg/dL (ref 6–20)
Calcium, Ion: 0.92 mmol/L — ABNORMAL LOW (ref 1.15–1.40)
Chloride: 109 mmol/L (ref 98–111)
Creatinine, Ser: 1 mg/dL (ref 0.61–1.24)
Glucose, Bld: 136 mg/dL — ABNORMAL HIGH (ref 70–99)
HCT: 44 % (ref 39.0–52.0)
Hemoglobin: 15 g/dL (ref 13.0–17.0)
Potassium: 3.6 mmol/L (ref 3.5–5.1)
Sodium: 141 mmol/L (ref 135–145)
TCO2: 20 mmol/L — ABNORMAL LOW (ref 22–32)

## 2024-03-04 LAB — DIFFERENTIAL
Abs Immature Granulocytes: 0.02 10*3/uL (ref 0.00–0.07)
Basophils Absolute: 0 10*3/uL (ref 0.0–0.1)
Basophils Relative: 0 %
Eosinophils Absolute: 0 10*3/uL (ref 0.0–0.5)
Eosinophils Relative: 1 %
Immature Granulocytes: 0 %
Lymphocytes Relative: 24 %
Lymphs Abs: 1.6 10*3/uL (ref 0.7–4.0)
Monocytes Absolute: 0.7 10*3/uL (ref 0.1–1.0)
Monocytes Relative: 11 %
Neutro Abs: 4.2 10*3/uL (ref 1.7–7.7)
Neutrophils Relative %: 64 %

## 2024-03-04 LAB — ECHOCARDIOGRAM LIMITED
Est EF: <20
Height: 50 in
Weight: 2716.07 [oz_av]

## 2024-03-04 LAB — CBC
HCT: 43.8 % (ref 39.0–52.0)
Hemoglobin: 14.3 g/dL (ref 13.0–17.0)
MCH: 30.6 pg (ref 26.0–34.0)
MCHC: 32.6 g/dL (ref 30.0–36.0)
MCV: 93.8 fL (ref 80.0–100.0)
Platelets: 210 10*3/uL (ref 150–400)
RBC: 4.67 MIL/uL (ref 4.22–5.81)
RDW: 16.2 % — ABNORMAL HIGH (ref 11.5–15.5)
WBC: 6.6 10*3/uL (ref 4.0–10.5)
nRBC: 0 % (ref 0.0–0.2)

## 2024-03-04 LAB — CBG MONITORING, ED: Glucose-Capillary: 138 mg/dL — ABNORMAL HIGH (ref 70–99)

## 2024-03-04 LAB — PROTIME-INR
INR: 1.5 — ABNORMAL HIGH (ref 0.8–1.2)
Prothrombin Time: 18.1 s — ABNORMAL HIGH (ref 11.4–15.2)

## 2024-03-04 LAB — MAGNESIUM: Magnesium: 1.3 mg/dL — ABNORMAL LOW (ref 1.7–2.4)

## 2024-03-04 LAB — APTT: aPTT: 30 s (ref 24–36)

## 2024-03-04 LAB — ETHANOL: Alcohol, Ethyl (B): <10 mg/dL (ref <10)

## 2024-03-04 MED ORDER — BUDESONIDE 0.5 MG/2ML IN SUSP
0.5000 mg | Freq: Two times a day (BID) | RESPIRATORY_TRACT | Status: DC
  Administered 2024-03-04 – 2024-04-06 (×52): 0.5 mg via RESPIRATORY_TRACT
  Filled 2024-03-04 (×68): qty 2

## 2024-03-04 MED ORDER — MORPHINE SULFATE (PF) 2 MG/ML IV SOLN
2.0000 mg | INTRAVENOUS | Status: DC | PRN
  Administered 2024-03-04: 2 mg via INTRAVENOUS
  Filled 2024-03-04: qty 1

## 2024-03-04 MED ORDER — STROKE: EARLY STAGES OF RECOVERY BOOK
Freq: Once | Status: AC
  Filled 2024-03-04: qty 1

## 2024-03-04 MED ORDER — CHLORHEXIDINE GLUCONATE CLOTH 2 % EX PADS
6.0000 | MEDICATED_PAD | Freq: Every day | CUTANEOUS | Status: DC
  Administered 2024-03-04 – 2024-03-07 (×3): 6 via TOPICAL

## 2024-03-04 MED ORDER — HEPARIN (PORCINE) 25000 UT/250ML-% IV SOLN
1000.0000 [IU]/h | INTRAVENOUS | Status: AC
  Administered 2024-03-04: 750 [IU]/h via INTRAVENOUS
  Administered 2024-03-06: 1000 [IU]/h via INTRAVENOUS
  Filled 2024-03-04 (×2): qty 250

## 2024-03-04 MED ORDER — PANTOPRAZOLE SODIUM 40 MG IV SOLR
40.0000 mg | INTRAVENOUS | Status: DC
  Administered 2024-03-04 – 2024-03-09 (×5): 40 mg via INTRAVENOUS
  Filled 2024-03-04 (×6): qty 10

## 2024-03-04 MED ORDER — REVEFENACIN 175 MCG/3ML IN SOLN
175.0000 ug | Freq: Every day | RESPIRATORY_TRACT | Status: DC
  Administered 2024-03-05 – 2024-06-08 (×65): 175 ug via RESPIRATORY_TRACT
  Filled 2024-03-04 (×107): qty 3

## 2024-03-04 MED ORDER — UMECLIDINIUM BROMIDE 62.5 MCG/ACT IN AEPB
1.0000 | INHALATION_SPRAY | Freq: Every day | RESPIRATORY_TRACT | Status: DC
  Filled 2024-03-04: qty 7

## 2024-03-04 MED ORDER — MAGNESIUM SULFATE 4 GM/100ML IV SOLN
4.0000 g | Freq: Once | INTRAVENOUS | Status: AC
  Administered 2024-03-04: 4 g via INTRAVENOUS
  Filled 2024-03-04: qty 100

## 2024-03-04 MED ORDER — METOPROLOL TARTRATE 5 MG/5ML IV SOLN: 5.0000 mg | Freq: Four times a day (QID) | INTRAVENOUS | Status: DC | PRN

## 2024-03-04 MED ORDER — LORAZEPAM 2 MG/ML IJ SOLN
1.0000 mg | Freq: Once | INTRAMUSCULAR | Status: AC
Start: 2024-03-04 — End: 2024-03-04
  Administered 2024-03-04: 1 mg via INTRAVENOUS
  Filled 2024-03-04: qty 1

## 2024-03-04 MED ORDER — FLUTICASONE FUROATE-VILANTEROL 100-25 MCG/ACT IN AEPB
1.0000 | INHALATION_SPRAY | Freq: Every day | RESPIRATORY_TRACT | Status: DC
  Filled 2024-03-04: qty 28

## 2024-03-04 MED ORDER — NICOTINE 14 MG/24HR TD PT24
14.0000 mg | MEDICATED_PATCH | Freq: Every day | TRANSDERMAL | Status: DC
  Administered 2024-03-04 – 2024-03-13 (×7): 14 mg via TRANSDERMAL
  Filled 2024-03-04 (×25): qty 1

## 2024-03-04 MED ORDER — ALBUTEROL SULFATE (2.5 MG/3ML) 0.083% IN NEBU
3.0000 mL | INHALATION_SOLUTION | Freq: Four times a day (QID) | RESPIRATORY_TRACT | Status: DC | PRN
  Administered 2024-03-06 – 2024-03-30 (×14): 3 mL via RESPIRATORY_TRACT
  Filled 2024-03-04 (×14): qty 3

## 2024-03-04 MED ORDER — IOHEXOL 350 MG/ML SOLN
100.0000 mL | Freq: Once | INTRAVENOUS | Status: AC | PRN
  Administered 2024-03-04: 100 mL via INTRAVENOUS

## 2024-03-04 MED ORDER — ARFORMOTEROL TARTRATE 15 MCG/2ML IN NEBU
15.0000 ug | INHALATION_SOLUTION | Freq: Two times a day (BID) | RESPIRATORY_TRACT | Status: DC
  Administered 2024-03-04 – 2024-04-06 (×52): 15 ug via RESPIRATORY_TRACT
  Filled 2024-03-04 (×68): qty 2

## 2024-03-04 NOTE — H&P (Signed)
 History and Physical    Patient: [name] WUJ:[id number] DOB: 01-Jan-1966 DOA: 03/04/2024 DOS: the patient was seen and examined on 03/04/2024 PCP: Georganna Skeans, MD  Patient coming from: Home - lives alone. Bilateral L/E amputee/ WC    Chief Complaint: stroke like symptoms   HPI: [name] is a 58 y.o. male with medical history significant of cocaine abuse, schizoaffective d/o, depression, T2DM, HTN, GERD, systolic CHF, COPD, s/p b/l lower extremity amputee, chronic pain who presented to ED with stroke like symptoms. Caregiver found him with slurred speech and right sided weakness. History is difficult due to dysarthria and aphasia, but states his symptoms started yesterday evening. His caregiver checked on him today and found him on the ground with right sided weakness and unintelligible speech.  He is right handed.    Denies any fever/chills, vision changes/headaches, chest pain or palpitations, shortness of breath or cough, abdominal pain, N/V/D, dysuria or leg swelling.   He does not smoke, but drinks alcohol. Can't understand how much. Uses cocaine every few days. Admits to using a few days ago.   ER Course:  vitals: afebrile, bp :171/102, HR: 85, RR: 27, oxygen: 92% on 2L oxygen  Pertinent labs: INR; 1.5, AST: 59, ALT: 87, t.bili: 2.4,  CT head: Acute left MCA territory infarct affecting the insula, caudate nucleus, lentiform nucleus and internal capsule. ASPECTS is 6. 2. Asymmetric hyperdensity of the M1/proximal M2 left middle cerebral artery suspicious for the presence of thrombus. A CTA of the head/neck is recommended for further evaluation. 3. Background parenchymal atrophy, chronic small vessel ischemic disease and chronic infarcts, as described. CTA head/neck: Acute occlusion of a proximal M2 left middle cerebral artery vessel. 2. Background intracranial atherosclerotic disease with multifocal stenoses, most notably as follows. 3. Severe near occlusive stenoses of  the intracranial left vertebral artery, new from the prior CTA of 02/03/2022. 4. Severe stenosis of a right posterior cerebral artery branch at the P3/P4 junction, also new from the prior exam. The common carotid and internal carotid arteries are patent with neck. Atherosclerotic plaque bilaterally, as described. Most notably, there is a severe (greater than 90%) stenosis at the right ICA origin which is similar to the prior CTA of 02/03/2022. 2. The vertebral arteries are patent in the neck. Progressive severe stenosis of the left vertebral artery proximal V1 segment. 3. Aortic Atherosclerosis (ICD10-I70.0) and Emphysema (ICD10-J43.9). In ED: neurology consulted. TRH asked to admit.   Review of Systems: As mentioned in the history of present illness. All other systems reviewed and are negative. Past Medical History:  Diagnosis Date   Asthma    Bipolar 1 disorder (HCC)    CHF (congestive heart failure) (HCC)    COPD (chronic obstructive pulmonary disease) (HCC)    Depression    Diabetes mellitus without complication (HCC)    GERD (gastroesophageal reflux disease)    Homelessness    Hypertension    QT prolongation    Schizoaffective disorder (HCC)    Past Surgical History:  Procedure Laterality Date   AMPUTATION Left 12/10/2022   Procedure: LEFT BELOW KNEE AMPUTATION;  Surgeon: Nadara Mustard, MD;  Location: [facility] OR;  Service: Orthopedics;  Laterality: Left;   AMPUTATION TOE Bilateral 01/02/2021   Procedure: AMPUTATION TOE;  Surgeon: Park Liter, DPM;  Location: [facility] OR;  Service: Podiatry;  Laterality: Bilateral;  Ok to leave patient on hospital bed   IR ANGIO INTRA EXTRACRAN SEL COM CAROTID INNOMINATE BILAT MOD SED  12/17/2020   IR ANGIO VERTEBRAL  SEL VERTEBRAL BILAT MOD SED  12/17/2020   MULTIPLE EXTRACTIONS WITH ALVEOLOPLASTY N/A 03/25/2023   Procedure: IRRIGATION AND DEBRIDEMENT, MULTIPLE EXTRACTION WITH ALVEOLOPLASTY X 4 , EXTRACTIONS 4 THROUGH 13, 20 THROUGHT 29;  Surgeon: Vivia Ewing, DMD;  Location: [facility] OR;  Service: Oral Surgery;  Laterality: N/A;   RADIOLOGY WITH ANESTHESIA N/A 12/17/2020   Procedure: IR WITH ANESTHESIA;  Surgeon: Radiologist, Medication, MD;  Location: [facility] OR;  Service: Radiology;  Laterality: N/A;   Social History:  reports that he has been smoking cigarettes. He has never used smokeless tobacco. He reports current alcohol use. He reports current drug use. Drug: Cocaine.  Allergies  Allergen Reactions   Acetaminophen Hives, Nausea And Vomiting, Rash and Anaphylaxis    PT states that if he takes this he is going to the hospital   Motrin [Ibuprofen] Anaphylaxis   Nsaids Anaphylaxis    PT states that if he takes this he is going to the hospital   Lyrica Cr [Pregabalin Er] Other (See Comments)    Dystonia Dysarthria    Peanut-Containing Drug Products Other (See Comments)    Pt denies allergy to peanut butter 03/02/2021    Family History  Problem Relation Age of Onset   Drug abuse Mother    Suicidality Mother     Prior to Admission medications   Medication Sig Start Date End Date Taking? Authorizing Provider  albuterol (ACCUNEB) 1.25 MG/3ML nebulizer solution Take 3 mLs (1.25 mg total) by nebulization every 6 (six) hours as needed for wheezing. 01/21/24   Georganna Skeans, MD  albuterol (VENTOLIN HFA) 108 (90 Base) MCG/ACT inhaler Inhale 2 puffs into the lungs every 4 (four) hours as needed for wheezing or shortness of breath. 01/18/24   Berton Mount I, MD  ARIPiprazole (ABILIFY) 5 MG tablet Take 1 tablet (5 mg total) by mouth daily. 01/18/24   Berton Mount I, MD  budesonide (PULMICORT) 0.25 MG/2ML nebulizer solution Take 2 mLs (0.25 mg total) by nebulization daily. 01/21/24   Georganna Skeans, MD  carvedilol (COREG) 12.5 MG tablet Take 1 tablet (12.5 mg total) by mouth 2 (two) times daily with a meal. 01/18/24 02/17/24  Barnetta Chapel, MD  carvedilol (COREG) 25 MG tablet Take 1 tablet (25 mg total) by mouth 2 (two) times daily with a  meal. 01/21/24   Georganna Skeans, MD  Fluticasone-Umeclidin-Vilant (TRELEGY ELLIPTA) 100-62.5-25 MCG/ACT AEPB Inhale 1 puff into the lungs daily. 01/18/24   Barnetta Chapel, MD  gabapentin (NEURONTIN) 300 MG capsule Take 2 capsules (600 mg total) by mouth 3 (three) times daily. 01/18/24 03/18/24  Barnetta Chapel, MD  isosorbide-hydrALAZINE (BIDIL) 20-37.5 MG tablet Take 1 tablet by mouth 3 (three) times daily. 01/18/24   Barnetta Chapel, MD  nicotine (NICODERM CQ - DOSED IN MG/24 HOURS) 21 mg/24hr patch Place 1 patch (21 mg total) onto the skin daily as needed (Tobacco craving). 01/18/24   Barnetta Chapel, MD  oxyCODONE (ROXICODONE) 5 MG immediate release tablet Take 1 tablet (5 mg total) by mouth every 8 (eight) hours as needed for moderate pain (pain score 4-6) or breakthrough pain. 01/18/24 01/17/25  Berton Mount I, MD  pantoprazole (PROTONIX) 40 MG tablet Take 1 tablet (40 mg total) by mouth daily. 01/18/24   Barnetta Chapel, MD  predniSONE (DELTASONE) 10 MG tablet Take 60 mg (6tabs) by mouth once daily for 3 days, then 40mg  (4tabs)once daily for 3 days, then 30mg  (3tabs) once daily for 3 days, then 20mg  (2tabs) once daily  for 3 days, then 10mg  (1tab) once daily for 3 days and stop. 01/18/24   Barnetta Chapel, MD  tiotropium (SPIRIVA HANDIHALER) 18 MCG inhalation capsule Place 1 capsule (18 mcg total) into inhaler and inhale daily. 01/21/24   Georganna Skeans, MD  torsemide (DEMADEX) 20 MG tablet Take 2 tablets (40 mg total) by mouth daily. 01/18/24   Barnetta Chapel, MD    Physical Exam: Vitals:   03/04/24 1530 03/04/24 1550 03/04/24 1552 03/04/24 1802  BP:   (!) 171/102 129/87  Pulse:   85   Resp:   (!) 27 14  Temp: 98 F (36.7 C)  98.2 F (36.8 C)   TempSrc:   Oral   SpO2:   92%   Weight:  77 kg    Height:  4\' 2"  (1.27 m)     General:  Appears calm and comfortable and is in NAD Eyes:  PERRL, EOMI, normal lids, iris ENT:  grossly normal hearing, lips & tongue, mmm;  appropriate dentition Neck:  no LAD, masses or thyromegaly; no carotid bruits Cardiovascular:  RRR, no m/r/g. B/L BKA Respiratory:   CTA bilaterally with no wheezes/rales/rhonchi.  Normal respiratory effort. Supraglottic wet noises  Abdomen:  soft, NT, ND, NABS Back:   normal alignment, no CVAT Skin:  no rash or induration seen on limited exam Musculoskeletal:  LUE and LLE strength wnl. RUE 0/5, RLE: 1/5.  Psychiatric:  grossly normal mood and affect, speech dysarthric and aphasic  Aox2.  Neurologic:  CN II-VI grossly intact. CN VII, IX, XII impaired. Right sided facial droop with drooling. Right sided hemiparesis.    Radiological Exams on Admission: Independently reviewed - see discussion in A/P where applicable  ECHOCARDIOGRAM LIMITED Result Date: 03/04/2024    ECHOCARDIOGRAM REPORT   Patient Name:   GALEN RUSSMAN Date of Exam: 03/04/2024 Medical Rec #:  [id number]      Height:       50.0 in Accession #:    [id number]     Weight:       169.8 lb Date of Birth:  30-Jan-1966      BSA:          1.525 m Patient Age:    57 years       BP:           171/102 mmHg Patient Gender: M              HR:           76 bpm. Exam Location:  Inpatient Procedure: Limited Echo and Color Doppler (Both Spectral and Color Flow Doppler            were utilized during procedure). Indications:    stroke  History:        Patient has prior history of Echocardiogram examinations, most                 recent 01/14/2024.  Sonographer:    Delcie Roch RDCS Referring Phys: [phone] DENISE A Veyda Kaufman IMPRESSIONS  1. The apex has prominen trabecualtions. There is a large mobile clot in the LV that appears to be attached to the interventricular septum just under the mitral valve. . Left ventricular ejection fraction, by estimation, is <20%. The left ventricle has severely decreased function. The left ventricle demonstrates global hypokinesis. Left ventricular diastolic function could not be evaluated.  2. There is a small RV apical clot.  Right ventricular systolic function is severely reduced. The right ventricular size is normal.  3. There is a largr mobile clot in the LA. Left atrial size was mildly dilated.  4. Right atrial size was mildly dilated.  5. The mitral valve is normal in structure. Trivial mitral valve regurgitation. No evidence of mitral stenosis.  6. The aortic valve is normal in structure. Aortic valve regurgitation is not visualized. No aortic stenosis is present. Conclusion(s)/Recommendation(s): Severe biventricular dysfunction with large mobile clots in the LA, RV and LV as described above. FINDINGS  Left Ventricle: The apex has prominen trabecualtions. There is a large mobile clot in the LV that appears to be attached to the interventricular septum just under the mitral valve. Left ventricular ejection fraction, by estimation, is <20%. The left ventricle has severely decreased function. The left ventricle demonstrates global hypokinesis. The left ventricular internal cavity size was normal in size. There is no left ventricular hypertrophy. Left ventricular diastolic function could not be evaluated. Right Ventricle: There is a small RV apical clot. The right ventricular size is normal. No increase in right ventricular wall thickness. Right ventricular systolic function is severely reduced. Left Atrium: There is a largr mobile clot in the LA. Left atrial size was mildly dilated. Right Atrium: Right atrial size was mildly dilated. Pericardium: There is no evidence of pericardial effusion. Mitral Valve: The mitral valve is normal in structure. Trivial mitral valve regurgitation. No evidence of mitral valve stenosis. Tricuspid Valve: The tricuspid valve is normal in structure. Tricuspid valve regurgitation is trivial. No evidence of tricuspid stenosis. Aortic Valve: The aortic valve is normal in structure. Aortic valve regurgitation is not visualized. No aortic stenosis is present. Pulmonic Valve: The pulmonic valve was not well  visualized. Pulmonic valve regurgitation is not visualized. No evidence of pulmonic stenosis. Aorta: The aortic root is normal in size and structure. Venous: The inferior vena cava was not well visualized. IAS/Shunts: No atrial level shunt detected by color flow Doppler. Arvilla Meres MD Electronically signed by Arvilla Meres MD Signature Date/Time: 03/04/2024/4:59:08 PM    Final    CT ANGIO HEAD NECK W WO CM W PERF (CODE STROKE) Result Date: 03/04/2024 CLINICAL DATA:  Provided history: Neuro deficit, acute, stroke suspected. EXAM: CT ANGIOGRAPHY HEAD AND NECK CT PERFUSION BRAIN TECHNIQUE: Multidetector CT imaging of the head and neck was performed using the standard protocol during bolus administration of intravenous contrast. Multiplanar CT image reconstructions and MIPs were obtained to evaluate the vascular anatomy. Carotid stenosis measurements (when applicable) are obtained utilizing NASCET criteria, using the distal internal carotid diameter as the denominator. Multiphase CT imaging of the brain was performed following IV bolus contrast injection. Subsequent parametric perfusion maps were calculated using RAPID software. RADIATION DOSE REDUCTION: This exam was performed according to the departmental dose-optimization program which includes automated exposure control, adjustment of the mA and/or kV according to patient size and/or use of iterative reconstruction technique. CONTRAST:  Administered contrast not known at this time. COMPARISON:  Non-contrast head CT performed earlier today 03/04/2024. CT angiogram head/neck 02/03/2022. FINDINGS: CTA NECK FINDINGS Aortic arch: Standard aortic branching. A sclerotic plaque within the visualized thoracic aorta and proximal major branch vessels of the neck. No hemodynamically significant innominate or proximal subclavian artery stenosis. Right carotid system: CCA and ICA patent within the neck. Bulky predominately calcified aspect plaque about the carotid  bifurcation with severe (greater than 90%) short-segment stenosis at the ICA origin. Noncalcified atherosclerotic plaque elsewhere within the mid and distal CCA resulting in less than 50% stenosis. Partially retropharyngeal course of the cervical ICA. Left carotid  system: CCA and ICA patent within the neck without hemodynamically significant stenosis (50% or greater). Mild sclerotic plaque within the mid CCA. Moderate sclerotic plaque about the carotid bifurcation and within the proximal ICA. Vertebral arteries: Vertebral arteries patent within the neck. Progressive severe stenosis of the left vertebral artery proximal V1 segment. Skeleton: The patient is edentulous. Cervical spondylosis. No acute fracture or aggressive osseous lesion. Other neck: No neck mass or cervical lymphadenopathy. Upper chest: No consolidation within the imaged lung apices. Centrilobular emphysema. Review of the MIP images confirms the above findings CTA HEAD FINDINGS Anterior circulation: The intracranial internal carotid arteries are patent. Atherosclerotic plaque within both vessels with no more than mild stenosis. The M1 middle cerebral arteries are patent. A proximal M2 left middle cerebral artery vessel is occluded at its origin, new from the CTA 02/03/2022 (for instance as seen on series 11, image 25). No right M2 proximal branch occlusion or high-grade proximal stenosis is identified. The anterior cerebral arteries are patent. Developmentally hypoplastic or absent right A1 segment. No intracranial aneurysm is identified. Posterior circulation: The intracranial vertebral arteries are patent. Severe near occlusive stenoses of the left vertebral artery proximal to, at the level of, and distal to the PICA origin. These stenoses are new from the prior CTA of 02/03/2022. The basilar artery is patent. The posterior cerebral arteries are patent. Severe stenosis within a right PCA branch at the P3/P4 junction, new from the prior CTA.  Posterior communicating arteries are hypoplastic or absent, bilaterally. Venous sinuses: Within the limitations of contrast timing, no convincing thrombus. Anatomic variants: As described. Review of the MIP images confirms the above findings CT Brain Perfusion Findings: CBF (<30%) Volume: 0mL Perfusion (Tmax>6.0s) volume: (predominately within the left MCA vascular territory, although with some additional foci in the right cerebral watershed territory). Mismatch Volume: Infarction Location: None identified. However, an acute infarct was demonstrated within the left MCA territory on the non-contrast head CT performed earlier today. CTA head impression #1 called by telephone at the time of interpretation on 03/04/2024 at 3:26 pm to provider [facility] , who verbally acknowledged these results. IMPRESSION: CTA neck: 1. The common carotid and internal carotid arteries are patent with neck. Atherosclerotic plaque bilaterally, as described. Most notably, there is a severe (greater than 90%) stenosis at the right ICA origin which is similar to the prior CTA of 02/03/2022. 2. The vertebral arteries are patent in the neck. Progressive severe stenosis of the left vertebral artery proximal V1 segment. 3. Aortic Atherosclerosis (ICD10-I70.0) and Emphysema (ICD10-J43.9). CTA head: 1. Acute occlusion of a proximal M2 left middle cerebral artery vessel. 2. Background intracranial atherosclerotic disease with multifocal stenoses, most notably as follows. 3. 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 4. Severe stenosis of a right posterior cerebral artery branch at the P3/P4 junction, also new from the prior exam. CT perfusion head: The perfusion software identifies regions of hypoperfusion totaling 117 mL (utilizing the Tmax>6 seconds threshold). These foci are predominantly located within the left MCA vascular territory, although some foci are  present within the right cerebral watershed territory. The perfusion software identifies no core infarct. However, an acute infarct was demonstrated on the non-contrast head CT performed earlier today, as described. The perfusion software reports a mismatch volume of 117 mL. 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 Electronically Signed  By: Jackey Loge D.O.   On: 03/04/2024 15:45   CT HEAD CODE STROKE WO CONTRAST Result Date: 03/04/2024 CLINICAL DATA:  Code stroke. Neuro deficit, acute, stroke suspected. EXAM: CT HEAD WITHOUT CONTRAST TECHNIQUE: Contiguous axial images were obtained from the base of the skull through the vertex without intravenous contrast. RADIATION DOSE REDUCTION: This exam was performed according to the departmental dose-optimization program which includes automated exposure control, adjustment of the mA and/or kV according to patient size and/or use of iterative reconstruction technique. COMPARISON:  Non-contrast head CT, CT angiogram head/neck and CT venogram head 02/03/2022. FINDINGS: Brain: Mild generalized parenchymal atrophy. Acute left MCA territory infarct affecting the left insula, left caudate nucleus, left lentiform nucleus and left internal capsule. Background moderate patchy and ill-defined hypoattenuation within cerebral white matter, nonspecific but compatible with chronic small vessel ischemic disease. 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 There is no acute intracranial hemorrhage. No extra-axial fluid collection. No evidence of an intracranial mass. No midline shift. Vascular: Asymmetric hyperdensity of the M1/proximal M2 left middle cerebral artery suspicious for the presence of thrombus. Atherosclerotic calcifications. Skull: No calvarial fracture or aggressive osseous lesion.  Sinuses/Orbits: No mass or acute finding within the imaged orbits. Chronic medially displaced fracture deformity of the left lamina papyracea. No significant paranasal sinus disease at the imaged levels. ASPECTS ([place]berta Stroke Program Early CT Score) - Ganglionic level infarction (caudate, lentiform nuclei, internal capsule, insula, M1-M3 cortex): 3 - Supraganglionic infarction (M4-M6 cortex): 3 Total score (0-10 with 10 being normal): 6 Impressions #1 and #2 called by telephone at the time of interpretation on 03/04/2024 at 2:56 pm to provider Dr. Armanda Magic, who verbally acknowledged these results. IMPRESSION: 1. Acute left MCA territory infarct affecting the insula, caudate nucleus, lentiform nucleus and internal capsule. ASPECTS is 6. 2. Asymmetric hyperdensity of the M1/proximal M2 left middle cerebral artery suspicious for the presence of thrombus. A CTA of the head/neck is recommended for further evaluation. 3. Background parenchymal atrophy, chronic small vessel ischemic disease and chronic infarcts, as described. Electronically Signed   By: Jackey Loge D.O.   On: 03/04/2024 14:58    EKG: Independently reviewed.  NSR with rate 78; nonspecific ST changes with no evidence of acute ischemia Prolonged QT  Labs on Admission: I have personally reviewed the available labs and imaging studies at the time of the admission.  Pertinent labs:   INR; 1.5,  AST: 59,  ALT: 87,  t.bili: 2.4,  Assessment and Plan: Principal Problem:   Acute CVA (cerebrovascular accident) (HCC) Active Problems:   Acute thrombus of left ventricle (HCC)   Chronic systolic CHF (congestive heart failure) (HCC)   Prolonged QT interval   Elevated liver enzymes   COPD (chronic obstructive pulmonary disease) (HCC)   Cocaine abuse (HCC)   Schizoaffective disorder, bipolar type (HCC)   Benign essential HTN   Acid reflux   History of DVT (deep vein thrombosis)   Type 2 diabetes mellitus (HCC)   Status post bilateral BKA.     Assessment and Plan: * Acute CVA (cerebrovascular accident) (HCC) 58 year old male presenting to ED after being found with new onset right sided weakness and slurred speech found to have acute left MCA territory infarct by CT with right sided hemiplegia and severe dysarthric and aphasic speech.  -admit to progressive for stroke work up in setting of large mobile thrombus in LV, RV and LA  -Neurochecks per protocol -Neurology consulted -MRI  brain without contrast pending  -lipid panel pending, A1C of 5.8  -Permissive hypertension first 24 hours <220/110 -high aspiration risk, failed bedside swallow. Oral dysphagia and can not swallow so unsure about laryngeal elevation. Aspiration precautions, ST swallow eval  -PT/ OT/ SLP consult -SW consult likely needs inpatient rehab    Acute thrombus of left ventricle (HCC) Large thrombus of LV, near LV outflow tract as well as mobile clots in RV and LA  Discussed with neurology and typically do not AC for 48 hours following moderate size stroke Brain MRI pending: need this back to help decision making  Consult cardiology for recs on [facility] with risk for embolization/death with mobile thromboses vs. Bleed in setting of acute moderate sized CVA  Attempted to discuss situation with patient and he became agitated and started to talk loudly to me. Unsure if he is grasping the complexity and possible grave outcome of the situation/findings at hand    Chronic systolic CHF (congestive heart failure) (HCC) Does not appear overloaded Repeat echo shows severe biventricular dysfunction with EF <20% with large mobile clots in the LA, RV and LV Strict I/O Medication IV for now as he is not safe for swallow  Strict I/O  Ongoing cocaine use  Consider palliative consult   Elevated liver enzymes Mildly elevated, shock liver in the past Liver enzymes normal in February  Continue to trend   Prolonged QT interval Optimize electrolytes Keep on telemetry Avoid  qt prolonging drugs  Repeat ekg in AM    COPD (chronic obstructive pulmonary disease) (HCC) No s/sx of exacerbation Continue trelegy and SABA prn   Cocaine abuse (HCC) Admits to ongoing use.  Last used a few days ago  Likely contributing to severe systolic CHF   Schizoaffective disorder, bipolar type (HCC) Start back abilify when he can safely swallow May need IM prn if becomes agitated   Benign essential HTN Permissive HTN in setting of acute CVA Treat if systolic >220/110   Acid reflux Continue protonix   History of DVT (deep vein thrombosis) Per chart review from 01/2024  Previously declined anticoagulation  Per chart review  "patient has declined the use of medications and continue engaging into further discussion to determine the need for chronic therapy"    Type 2 diabetes mellitus (HCC) A1C of 5.8  Appears diet controlled   Status post bilateral BKA. Chronic pain -continue the use of gabapentin, oxycodone and muscle relaxants when can safely swallow PRN IV morphine for now     Advance Care Planning:   Code Status: Full Code   Consults: neurology and cardiology   DVT Prophylaxis: b/l amputation. Awaiting recs for [facility]   Family Communication: none   Severity of Illness: The appropriate patient status for this patient is INPATIENT. Inpatient status is judged to be reasonable and necessary in order to provide the required intensity of service to ensure the patient's safety. The patient's presenting symptoms, physical exam findings, and initial radiographic and laboratory data in the context of their chronic comorbidities is felt to place them at high risk for further clinical deterioration. Furthermore, it is not anticipated that the patient will be medically stable for discharge from the hospital within 2 midnights of admission.   * I certify that at the point of admission it is my clinical judgment that the patient will require inpatient hospital care spanning  beyond 2 midnights from the point of admission due to high intensity of service, high risk for further deterioration and high frequency of  surveillance required.*  Author: Orland Mustard, MD 03/04/2024 6:14 PM  For on call review [email or web address].

## 2024-03-04 NOTE — Assessment & Plan Note (Signed)
 A1C of 5.8  Appears diet controlled

## 2024-03-04 NOTE — Progress Notes (Signed)
 Responded to consult for IV. On arrival, pt declined. RN present and educated pt on reason for IV access. Pt continues to decline IV attempt.

## 2024-03-04 NOTE — Code Documentation (Signed)
 Stroke Response Nurse Documentation Code Documentation  [name] is a 58 y.o. male arriving to [facility]  via [facility] on 03/04/2024 with past medical hx of cocaine use disorder. On No antithrombotic. Code stroke was activated by EMS.   Patient from home where he was LKW at yesterday at 1300  and now complaining of rt sided weakness and aphasia .   Stroke team at the bedside on patient arrival. Labs drawn and patient cleared for CT by Dr. Charm Barges. Patient to CT with team. NIHSS 20, see documentation for details and code stroke times. Patient with disoriented, not following commands, right hemianopia, right facial droop, right arm weakness, right leg weakness, and Global aphasia  on exam. The following imaging was completed:  CT Head, CTA, and CTP. Patient is not a candidate for IV Thrombolytic due to outside the treatment window. Patient is not a candidate for IR due to outside treatment window.   Care Plan:  q 2 VS and NIHSS x 12 Then q 4  Nothing by mouth until stroke swallow screen obtained..   Process Delays Noted: difficult IV access for advanced imaging, delay finding family for LKW.  Bedside handoff with ED RN complete.  Argentina Donovan  Stroke Response RN

## 2024-03-04 NOTE — ED Provider Notes (Signed)
 [place] EMERGENCY DEPARTMENT AT [facility] Provider Note   CSN: [id number] Arrival date & time: 03/04/24  1428     History  No chief complaint on file.   Nicholous Girgenti is a 58 y.o. male.  He is presenting by EMS as a code stroke activation.  Last known well was 12 PM.  Acute onset of confusion slurred speech right-sided weakness.  He was emergently evaluated on arrival and taken to CAT scan.  He has significant comorbid medical issues with COPD depression schizoaffective CHF bilateral lower extremity amputee substance abuse.  Level 5 caveat secondary to altered mental status  HPI     Home Medications Prior to Admission medications   Medication Sig Start Date End Date Taking? Authorizing Provider  albuterol (ACCUNEB) 1.25 MG/3ML nebulizer solution Take 3 mLs (1.25 mg total) by nebulization every 6 (six) hours as needed for wheezing. 01/21/24   Georganna Skeans, MD  albuterol (VENTOLIN HFA) 108 (90 Base) MCG/ACT inhaler Inhale 2 puffs into the lungs every 4 (four) hours as needed for wheezing or shortness of breath. 01/18/24   Berton Mount I, MD  ARIPiprazole (ABILIFY) 5 MG tablet Take 1 tablet (5 mg total) by mouth daily. 01/18/24   Berton Mount I, MD  budesonide (PULMICORT) 0.25 MG/2ML nebulizer solution Take 2 mLs (0.25 mg total) by nebulization daily. 01/21/24   Georganna Skeans, MD  carvedilol (COREG) 12.5 MG tablet Take 1 tablet (12.5 mg total) by mouth 2 (two) times daily with a meal. 01/18/24 02/17/24  Barnetta Chapel, MD  carvedilol (COREG) 25 MG tablet Take 1 tablet (25 mg total) by mouth 2 (two) times daily with a meal. 01/21/24   Georganna Skeans, MD  Fluticasone-Umeclidin-Vilant (TRELEGY ELLIPTA) 100-62.5-25 MCG/ACT AEPB Inhale 1 puff into the lungs daily. 01/18/24   Barnetta Chapel, MD  gabapentin (NEURONTIN) 300 MG capsule Take 2 capsules (600 mg total) by mouth 3 (three) times daily. 01/18/24 03/18/24  Barnetta Chapel, MD  isosorbide-hydrALAZINE (BIDIL)  20-37.5 MG tablet Take 1 tablet by mouth 3 (three) times daily. 01/18/24   Barnetta Chapel, MD  nicotine (NICODERM CQ - DOSED IN MG/24 HOURS) 21 mg/24hr patch Place 1 patch (21 mg total) onto the skin daily as needed (Tobacco craving). 01/18/24   Barnetta Chapel, MD  oxyCODONE (ROXICODONE) 5 MG immediate release tablet Take 1 tablet (5 mg total) by mouth every 8 (eight) hours as needed for moderate pain (pain score 4-6) or breakthrough pain. 01/18/24 01/17/25  Berton Mount I, MD  pantoprazole (PROTONIX) 40 MG tablet Take 1 tablet (40 mg total) by mouth daily. 01/18/24   Barnetta Chapel, MD  predniSONE (DELTASONE) 10 MG tablet Take 60 mg (6tabs) by mouth once daily for 3 days, then 40mg  (4tabs)once daily for 3 days, then 30mg  (3tabs) once daily for 3 days, then 20mg  (2tabs) once daily for 3 days, then 10mg  (1tab) once daily for 3 days and stop. 01/18/24   Barnetta Chapel, MD  tiotropium (SPIRIVA HANDIHALER) 18 MCG inhalation capsule Place 1 capsule (18 mcg total) into inhaler and inhale daily. 01/21/24   Georganna Skeans, MD  torsemide (DEMADEX) 20 MG tablet Take 2 tablets (40 mg total) by mouth daily. 01/18/24   Barnetta Chapel, MD      Allergies    Acetaminophen, Motrin [ibuprofen], Nsaids, Lyrica cr [pregabalin er], and Peanut-containing drug products    Review of Systems   Review of Systems  Unable to perform ROS: Mental status change  Physical Exam Updated Vital Signs BP 129/87   Pulse 85   Temp 98.2 F (36.8 C) (Oral)   Resp 14   Ht 4\' 2"  (1.27 m)   Wt 77 kg   SpO2 92%   BMI 47.74 kg/m  Physical Exam Vitals and nursing note reviewed.  Constitutional:      General: He is not in acute distress.    Appearance: Normal appearance. He is well-developed.  HENT:     Head: Normocephalic and atraumatic.  Eyes:     Conjunctiva/sclera: Conjunctivae normal.  Cardiovascular:     Rate and Rhythm: Normal rate and regular rhythm.     Heart sounds: No murmur  heard. Pulmonary:     Effort: Pulmonary effort is normal. No respiratory distress.     Breath sounds: Normal breath sounds.  Abdominal:     Palpations: Abdomen is soft.     Tenderness: There is no abdominal tenderness.  Musculoskeletal:     Cervical back: Neck supple.     Comments: Bilateral BKA  Skin:    General: Skin is warm and dry.     Capillary Refill: Capillary refill takes less than 2 seconds.  Neurological:     Comments: Patient is significantly dysarthric.  He also has significant right-sided weakness.     ED Results / Procedures / Treatments   Labs (all labs ordered are listed, but only abnormal results are displayed) Labs Reviewed  PROTIME-INR - Abnormal; Notable for the following components:      Result Value   Prothrombin Time 18.1 (*)    INR 1.5 (*)    All other components within normal limits  CBC - Abnormal; Notable for the following components:   RDW 16.2 (*)    All other components within normal limits  COMPREHENSIVE METABOLIC PANEL WITH GFR - Abnormal; Notable for the following components:   CO2 19 (*)    Glucose, Bld 136 (*)    Calcium 7.9 (*)    Total Protein 5.7 (*)    Albumin 2.9 (*)    AST 59 (*)    ALT 87 (*)    Total Bilirubin 2.4 (*)    All other components within normal limits  MAGNESIUM - Abnormal; Notable for the following components:   Magnesium 1.3 (*)    All other components within normal limits  CBG MONITORING, ED - Abnormal; Notable for the following components:   Glucose-Capillary 138 (*)    All other components within normal limits  I-STAT CHEM 8, ED - Abnormal; Notable for the following components:   Glucose, Bld 136 (*)    Calcium, Ion 0.92 (*)    TCO2 20 (*)    All other components within normal limits  ETHANOL  APTT  DIFFERENTIAL  RAPID URINE DRUG SCREEN, HOSP PERFORMED  URINALYSIS, ROUTINE W REFLEX MICROSCOPIC  LIPID PANEL  BASIC METABOLIC PANEL WITH GFR  CBC    EKG EKG Interpretation Date/Time:  Friday March 04 2024 15:17:18 EDT Ventricular Rate:  78 PR Interval:  192 QRS Duration:  108 QT Interval:  541 QTC Calculation: 617 R Axis:   199  Text Interpretation: Sinus rhythm Biatrial enlargement Right axis deviation Consider left ventricular hypertrophy Probable lateral infarct, age indeterminate Abnrm T, consider ischemia, anterolateral lds Prolonged QT interval Confirmed by Meridee Score [phone]) on 03/04/2024 3:31:55 PM  Radiology ECHOCARDIOGRAM LIMITED Result Date: 03/04/2024    ECHOCARDIOGRAM REPORT   Patient Name:   [name] Date of Exam: 03/04/2024 Medical Rec #:  [id number]  Height:       50.0 in Accession #:    [id number]     Weight:       169.8 lb Date of Birth:  12-09-1965      BSA:          1.525 m Patient Age:    57 years       BP:           171/102 mmHg Patient Gender: M              HR:           76 bpm. Exam Location:  Inpatient Procedure: Limited Echo and Color Doppler (Both Spectral and Color Flow Doppler            were utilized during procedure). Indications:    stroke  History:        Patient has prior history of Echocardiogram examinations, most                 recent 01/14/2024.  Sonographer:    Delcie Roch RDCS Referring Phys: [phone] DENISE A WOLFE IMPRESSIONS  1. The apex has prominen trabecualtions. There is a large mobile clot in the LV that appears to be attached to the interventricular septum just under the mitral valve. . Left ventricular ejection fraction, by estimation, is <20%. The left ventricle has severely decreased function. The left ventricle demonstrates global hypokinesis. Left ventricular diastolic function could not be evaluated.  2. There is a small RV apical clot. Right ventricular systolic function is severely reduced. The right ventricular size is normal.  3. There is a largr mobile clot in the LA. Left atrial size was mildly dilated.  4. Right atrial size was mildly dilated.  5. The mitral valve is normal in structure. Trivial mitral valve regurgitation. No  evidence of mitral stenosis.  6. The aortic valve is normal in structure. Aortic valve regurgitation is not visualized. No aortic stenosis is present. Conclusion(s)/Recommendation(s): Severe biventricular dysfunction with large mobile clots in the LA, RV and LV as described above. FINDINGS  Left Ventricle: The apex has prominen trabecualtions. There is a large mobile clot in the LV that appears to be attached to the interventricular septum just under the mitral valve. Left ventricular ejection fraction, by estimation, is <20%. The left ventricle has severely decreased function. The left ventricle demonstrates global hypokinesis. The left ventricular internal cavity size was normal in size. There is no left ventricular hypertrophy. Left ventricular diastolic function could not be evaluated. Right Ventricle: There is a small RV apical clot. The right ventricular size is normal. No increase in right ventricular wall thickness. Right ventricular systolic function is severely reduced. Left Atrium: There is a largr mobile clot in the LA. Left atrial size was mildly dilated. Right Atrium: Right atrial size was mildly dilated. Pericardium: There is no evidence of pericardial effusion. Mitral Valve: The mitral valve is normal in structure. Trivial mitral valve regurgitation. No evidence of mitral valve stenosis. Tricuspid Valve: The tricuspid valve is normal in structure. Tricuspid valve regurgitation is trivial. No evidence of tricuspid stenosis. Aortic Valve: The aortic valve is normal in structure. Aortic valve regurgitation is not visualized. No aortic stenosis is present. Pulmonic Valve: The pulmonic valve was not well visualized. Pulmonic valve regurgitation is not visualized. No evidence of pulmonic stenosis. Aorta: The aortic root is normal in size and structure. Venous: The inferior vena cava was not well visualized. IAS/Shunts: No atrial level shunt detected by color flow Doppler.  Arvilla Meres MD Electronically  signed by Arvilla Meres MD Signature Date/Time: 03/04/2024/4:59:08 PM    Final    CT ANGIO HEAD NECK W WO CM W PERF (CODE STROKE) Result Date: 03/04/2024 CLINICAL DATA:  Provided history: Neuro deficit, acute, stroke suspected. EXAM: CT ANGIOGRAPHY HEAD AND NECK CT PERFUSION BRAIN TECHNIQUE: Multidetector CT imaging of the head and neck was performed using the standard protocol during bolus administration of intravenous contrast. Multiplanar CT image reconstructions and MIPs were obtained to evaluate the vascular anatomy. Carotid stenosis measurements (when applicable) are obtained utilizing NASCET criteria, using the distal internal carotid diameter as the denominator. Multiphase CT imaging of the brain was performed following IV bolus contrast injection. Subsequent parametric perfusion maps were calculated using RAPID software. RADIATION DOSE REDUCTION: This exam was performed according to the departmental dose-optimization program which includes automated exposure control, adjustment of the mA and/or kV according to patient size and/or use of iterative reconstruction technique. CONTRAST:  Administered contrast not known at this time. COMPARISON:  Non-contrast head CT performed earlier today 03/04/2024. CT angiogram head/neck 02/03/2022. FINDINGS: CTA NECK FINDINGS Aortic arch: Standard aortic branching. A sclerotic plaque within the visualized thoracic aorta and proximal major branch vessels of the neck. No hemodynamically significant innominate or proximal subclavian artery stenosis. Right carotid system: CCA and ICA patent within the neck. Bulky predominately calcified aspect plaque about the carotid bifurcation with severe (greater than 90%) short-segment stenosis at the ICA origin. Noncalcified atherosclerotic plaque elsewhere within the mid and distal CCA resulting in less than 50% stenosis. Partially retropharyngeal course of the cervical ICA. Left carotid system: CCA and ICA patent within the neck  without hemodynamically significant stenosis (50% or greater). Mild sclerotic plaque within the mid CCA. Moderate sclerotic plaque about the carotid bifurcation and within the proximal ICA. Vertebral arteries: Vertebral arteries patent within the neck. Progressive severe stenosis of the left vertebral artery proximal V1 segment. Skeleton: The patient is edentulous. Cervical spondylosis. No acute fracture or aggressive osseous lesion. Other neck: No neck mass or cervical lymphadenopathy. Upper chest: No consolidation within the imaged lung apices. Centrilobular emphysema. Review of the MIP images confirms the above findings CTA HEAD FINDINGS Anterior circulation: The intracranial internal carotid arteries are patent. Atherosclerotic plaque within both vessels with no more than mild stenosis. The M1 middle cerebral arteries are patent. A proximal M2 left middle cerebral artery vessel is occluded at its origin, new from the CTA 02/03/2022 (for instance as seen on series 11, image 25). No right M2 proximal branch occlusion or high-grade proximal stenosis is identified. The anterior cerebral arteries are patent. Developmentally hypoplastic or absent right A1 segment. No intracranial aneurysm is identified. Posterior circulation: The intracranial vertebral arteries are patent. Severe near occlusive stenoses of the left vertebral artery proximal to, at the level of, and distal to the PICA origin. These stenoses are new from the prior CTA of 02/03/2022. The basilar artery is patent. The posterior cerebral arteries are patent. Severe stenosis within a right PCA branch at the P3/P4 junction, new from the prior CTA. Posterior communicating arteries are hypoplastic or absent, bilaterally. Venous sinuses: Within the limitations of contrast timing, no convincing thrombus. Anatomic variants: As described. Review of the MIP images confirms the above findings CT Brain Perfusion Findings: CBF (<30%) Volume: 0mL Perfusion (Tmax>6.0s)  volume: (predominately within the left MCA vascular territory, although with some additional foci in the right cerebral watershed territory). Mismatch Volume: Infarction Location: None identified. However, an acute infarct was demonstrated within  the left MCA territory on the non-contrast head CT performed earlier today. CTA head impression #1 called by telephone at the time of interpretation on 03/04/2024 at 3:26 pm to provider [facility] , who verbally acknowledged these results. IMPRESSION: CTA neck: 1. The common carotid and internal carotid arteries are patent with neck. Atherosclerotic plaque bilaterally, as described. Most notably, there is a severe (greater than 90%) stenosis at the right ICA origin which is similar to the prior CTA of 02/03/2022. 2. The vertebral arteries are patent in the neck. Progressive severe stenosis of the left vertebral artery proximal V1 segment. 3. Aortic Atherosclerosis (ICD10-I70.0) and Emphysema (ICD10-J43.9). CTA head: 1. Acute occlusion of a proximal M2 left middle cerebral artery vessel. 2. Background intracranial atherosclerotic disease with multifocal stenoses, most notably as follows. 3. 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 4. Severe stenosis of a right posterior cerebral artery branch at the P3/P4 junction, also new from the prior exam. CT perfusion head: The perfusion software identifies regions of hypoperfusion totaling 117 mL (utilizing the Tmax>6 seconds threshold). These foci are predominantly located within the left MCA vascular territory, although some foci are present within the right cerebral watershed territory. The perfusion software identifies no core infarct. However, an acute infarct was demonstrated on the non-contrast head CT performed earlier today, as described. The perfusion software reports a mismatch volume of 117 mL. The foci of hypoperfusion detected within  the right cerebral watershed territory are likely related to the high-grade stenosis of the proximal cervical right ICA. Electronically Signed   By: Jackey Loge D.O.   On: 03/04/2024 15:45   CT HEAD CODE STROKE WO CONTRAST Result Date: 03/04/2024 CLINICAL DATA:  Code stroke. Neuro deficit, acute, stroke suspected. EXAM: CT HEAD WITHOUT CONTRAST TECHNIQUE: Contiguous axial images were obtained from the base of the skull through the vertex without intravenous contrast. RADIATION DOSE REDUCTION: This exam was performed according to the departmental dose-optimization program which includes automated exposure control, adjustment of the mA and/or kV according to patient size and/or use of iterative reconstruction technique. COMPARISON:  Non-contrast head CT, CT angiogram head/neck and CT venogram head 02/03/2022. FINDINGS: Brain: Mild generalized parenchymal atrophy. Acute left MCA territory infarct affecting the left insula, left caudate nucleus, left lentiform nucleus and left internal capsule. Background moderate patchy and ill-defined hypoattenuation within cerebral white matter, nonspecific but compatible with chronic small vessel ischemic disease. 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 There is no acute intracranial hemorrhage. No extra-axial fluid collection. No evidence of an intracranial mass. No midline shift. Vascular: Asymmetric hyperdensity of the M1/proximal M2 left middle cerebral artery suspicious for the presence of thrombus. Atherosclerotic calcifications. Skull: No calvarial fracture or aggressive osseous lesion. Sinuses/Orbits: No mass or acute finding within the imaged orbits. Chronic medially displaced fracture deformity of the left lamina papyracea. No significant paranasal sinus disease at the imaged levels. ASPECTS (Alberta Stroke Program Early CT Score) - Ganglionic level infarction (caudate, lentiform nuclei, internal  capsule, insula, M1-M3 cortex): 3 - Supraganglionic infarction (M4-M6 cortex): 3 Total score (0-10 with 10 being normal): 6 Impressions #1 and #2 called by telephone at the time of interpretation on 03/04/2024 at 2:56 pm to provider Dr. Armanda Magic, who verbally acknowledged these results. IMPRESSION: 1. Acute left MCA territory infarct affecting the insula, caudate nucleus, lentiform nucleus and internal capsule. ASPECTS is 6. 2. Asymmetric hyperdensity of the M1/proximal M2  left middle cerebral artery suspicious for the presence of thrombus. A CTA of the head/neck is recommended for further evaluation. 3. Background parenchymal atrophy, chronic small vessel ischemic disease and chronic infarcts, as described. Electronically Signed   By: Jackey Loge D.O.   On: 03/04/2024 14:58    Procedures .Critical Care  Performed by: Terrilee Files, MD Authorized by: Terrilee Files, MD   Critical care provider statement:    Critical care time (minutes):  45   Critical care time was exclusive of:  Separately billable procedures and treating other patients   Critical care was necessary to treat or prevent imminent or life-threatening deterioration of the following conditions:  CNS failure or compromise   Critical care was time spent personally by me on the following activities:  Development of treatment plan with patient or surrogate, discussions with consultants, evaluation of patient's response to treatment, examination of patient, obtaining history from patient or surrogate, ordering and performing treatments and interventions, ordering and review of laboratory studies, ordering and review of radiographic studies, pulse oximetry, re-evaluation of patient's condition and review of old charts   I assumed direction of critical care for this patient from another provider in my specialty: no       Medications Ordered in ED Medications   stroke: early stages of recovery book (has no administration in time range)   morphine (PF) 2 MG/ML injection 2 mg (2 mg Intravenous Given 03/04/24 1802)  metoprolol tartrate (LOPRESSOR) injection 5 mg (has no administration in time range)  albuterol (PROVENTIL) (2.5 MG/3ML) 0.083% nebulizer solution 3 mL (has no administration in time range)  fluticasone furoate-vilanterol (BREO ELLIPTA) 100-25 MCG/ACT 1 puff (has no administration in time range)    And  umeclidinium bromide (INCRUSE ELLIPTA) 62.5 MCG/ACT 1 puff (has no administration in time range)  pantoprazole (PROTONIX) injection 40 mg (has no administration in time range)  iohexol (OMNIPAQUE) 350 MG/ML injection 100 mL (100 mLs Intravenous Contrast Given 03/04/24 1522)  LORazepam (ATIVAN) injection 1 mg (1 mg Intravenous Given 03/04/24 1801)    ED Course/ Medical Decision Making/ A&P Clinical Course as of 03/04/24 1817  Fri Mar 04, 2024  1528 Patient was evaluated by neurology Dr. Armanda Magic.  He has an M2 occlusion.  Apparently is out of the window as his symptoms were likely since yesterday.  They are recommending aspirin load with 325 and medical admission. [MB]  1622 Discussed we will [facility] hospitalist Dr. Artis Flock who will evaluate him for admission [MB]    Clinical Course User Index [MB] Terrilee Files, MD                                 Medical Decision Making Amount and/or Complexity of Data Reviewed Labs: ordered. Radiology: ordered.  Risk Decision regarding hospitalization.   This patient complains of acute dysarthria and weakness; this involves an extensive number of treatment Options and is a complaint that carries with it a high risk of complications and morbidity. The differential includes stroke, bleed, tumor, arrhythmia, hypoglycemia  I ordered, reviewed and interpreted labs, which included CBC unremarkable, chemistries with low bicarb, magnesium low, alcohol negative I ordered imaging studies which included head CT and CT angio head and neck and I independently    visualized and interpreted  imaging which showed acute stroke Additional history obtained from EMS Previous records obtained and reviewed in epic including recent discharge summaries I consulted neurology Dr. Armanda Magic  and [facility] hospitalist Dr. Sheppard Penton and discussed lab and imaging findings and discussed disposition.  Cardiac monitoring reviewed, sinus rhythm Social determinants considered, multiple including tobacco use Food insecurity physically and active Critical Interventions: Code stroke activation requiring bedside presence and multiple reassessments, discussion for TNK candidate.  After the interventions stated above, I reevaluated the patient and found patient to be a significant deficits and not a TNK candidate. Admission and further testing considered, he will need medical admission for further workup.         Final Clinical Impression(s) / ED Diagnoses Final diagnoses:  Acute CVA (cerebrovascular accident) [facility])    Rx / DC Orders ED Discharge Orders     None         Terrilee Files, MD 03/04/24 Rickey Primus

## 2024-03-04 NOTE — Assessment & Plan Note (Addendum)
 Does not appear overloaded Repeat echo shows severe biventricular dysfunction with EF <20% with large mobile clots in the LA, RV and LV Strict I/O Medication IV for now as he is not safe for swallow  Strict I/O  Ongoing cocaine use  Consider palliative consult

## 2024-03-04 NOTE — ED Notes (Signed)
 Pt back from MRI

## 2024-03-04 NOTE — Progress Notes (Signed)
 eLink Physician-Brief Progress Note Patient Name: Mohsin Crum DOB: [birth year] MRN: [id number]   Date of Service  03/04/2024  HPI/Events of Note  58 year old man with cardiomyopathy and active cocaine use brought in as a code stroke by EMS. neurology evaluation showed global aphasia and dysarthria with right facial droop, NIHSS was 20 Head CT showed acute left MCA infarct CTA head and neck showed left M2 occlusion. Echo showed large mobile LV thrombus, LA thrombus and RV apical thrombus with depressed EF.  Neurology and cardiology evaluated and due to high risk of further embolic events, decision made to anticoagulate and admit to ICU.  eICU Interventions  Chart reviewed     Intervention Category Evaluation Type: New Patient Evaluation  Henry Russel, P 03/04/2024, 11:04 PM

## 2024-03-04 NOTE — Consult Note (Addendum)
 NEUROLOGY CONSULT NOTE   Date of service: March 04, 2024 Patient Name: Jas Betten MRN:  [id number] DOB:  12-13-1965 Chief Complaint: "Code stroke " Requesting Provider: Terrilee Files, MD  History of Present Illness  Linkin Vizzini is a 58 y.o. male with hx of CHF, COPD, diabetes,  GERD, hypertension, schizoaffective disorder, bipolar disorder, cocaine use, EtOH use, tobacco use, depression, bilateral BKA, chronic pain who presents to [facility] via EMS from home.  Per EMS his caregiver was with him when she noticed slurred speech and confusion.  EMS noted right arm weakness and right leg weakness. Patient was met at the [facility], he has global aphasia and very dysarthric speech and unable to decipher what he saying right facial droop right-sided weakness.  NIH 20 We contacted his caregiver Inetta Fermo who says he was last known well yesterday at around 1:30 PM, when she got there today she noticed him to be slumped over to the right and she called EMS  CT head with acute left MCA infarct, aspects 6.  CTA head and neck with left M2 occlusion.  There was a delay in obtaining CTA due to inability to obtain PIV  LKW: Yesterday afternoon around 1:30 PM Modified rankin score: 4-Needs assistance to walk and tend to bodily needs IV Thrombolysis:  No outside window EVT: No outside window   NIHSS components Score: Comment  1a Level of Conscious 0[x]  1[]  2[]  3[]      1b LOC Questions 0[]  1[]  2[x]       1c LOC Commands 0[]  1[]  2[x]       2 Best Gaze 0[x]  1[]  2[]       3 Visual 0[]  1[]  2[x]  3[]      4 Facial Palsy 0[]  1[]  2[x]  3[]      5a Motor Arm - left 0[x]  1[]  2[]  3[]  4[]  UN[]    5b Motor Arm - Right 0[]  1[]  2[]  3[x]  4[]  UN[]    6a Motor Leg - Left 0[x]  1[]  2[]  3[]  4[]  UN[]    6b Motor Leg - Right 0[]  1[]  2[]  3[]  4[x]  UN[]    7 Limb Ataxia 0[x]  1[]  2[]  3[]  UN[]     8 Sensory 0[x]  1[]  2[]  UN[]      9 Best Language 0[]  1[]  2[x]  3[]      10 Dysarthria 0[]  1[]  2[x]  UN[]      11 Extinct. and  Inattention 0[]  1[x]  2[]       TOTAL: 20      ROS  Comprehensive ROS Unable to ascertain due to aphasia  Past History   Past Medical History:  Diagnosis Date   Asthma    Bipolar 1 disorder (HCC)    CHF (congestive heart failure) (HCC)    COPD (chronic obstructive pulmonary disease) (HCC)    Depression    Diabetes mellitus without complication (HCC)    GERD (gastroesophageal reflux disease)    Homelessness    Hypertension    QT prolongation    Schizoaffective disorder (HCC)     Past Surgical History:  Procedure Laterality Date   AMPUTATION Left 12/10/2022   Procedure: LEFT BELOW KNEE AMPUTATION;  Surgeon: Nadara Mustard, MD;  Location: [facility] OR;  Service: Orthopedics;  Laterality: Left;   AMPUTATION TOE Bilateral 01/02/2021   Procedure: AMPUTATION TOE;  Surgeon: Park Liter, DPM;  Location: [facility] OR;  Service: Podiatry;  Laterality: Bilateral;  Ok to leave patient on hospital bed   IR ANGIO INTRA EXTRACRAN SEL COM CAROTID INNOMINATE BILAT MOD SED  12/17/2020   IR ANGIO VERTEBRAL SEL VERTEBRAL  BILAT MOD SED  12/17/2020   MULTIPLE EXTRACTIONS WITH ALVEOLOPLASTY N/A 03/25/2023   Procedure: IRRIGATION AND DEBRIDEMENT, MULTIPLE EXTRACTION WITH ALVEOLOPLASTY X 4 , EXTRACTIONS 4 THROUGH 13, 20 THROUGHT 29;  Surgeon: Vivia Ewing, DMD;  Location: [facility] OR;  Service: Oral Surgery;  Laterality: N/A;   RADIOLOGY WITH ANESTHESIA N/A 12/17/2020   Procedure: IR WITH ANESTHESIA;  Surgeon: Radiologist, Medication, MD;  Location: [facility] OR;  Service: Radiology;  Laterality: N/A;    Family History: Family History  Problem Relation Age of Onset   Drug abuse Mother    Suicidality Mother     Social History  reports that he has been smoking cigarettes. He has never used smokeless tobacco. He reports current alcohol use. He reports current drug use. Drug: Cocaine.  Allergies  Allergen Reactions   Acetaminophen Hives, Nausea And Vomiting, Rash and Anaphylaxis    PT states that if he takes this he is going  to the hospital   Motrin [Ibuprofen] Anaphylaxis   Nsaids Anaphylaxis    PT states that if he takes this he is going to the hospital   Lyrica Cr [Pregabalin Er] Other (See Comments)    Dystonia Dysarthria    Peanut-Containing Drug Products Other (See Comments)    Pt denies allergy to peanut butter 03/02/2021    Medications  No current facility-administered medications for this encounter.  Current Outpatient Medications:    albuterol (ACCUNEB) 1.25 MG/3ML nebulizer solution, Take 3 mLs (1.25 mg total) by nebulization every 6 (six) hours as needed for wheezing., Disp: 75 mL, Rfl: 12   albuterol (VENTOLIN HFA) 108 (90 Base) MCG/ACT inhaler, Inhale 2 puffs into the lungs every 4 (four) hours as needed for wheezing or shortness of breath., Disp: 18 g, Rfl: 0   ARIPiprazole (ABILIFY) 5 MG tablet, Take 1 tablet (5 mg total) by mouth daily., Disp: 30 tablet, Rfl: 1   budesonide (PULMICORT) 0.25 MG/2ML nebulizer solution, Take 2 mLs (0.25 mg total) by nebulization daily., Disp: 60 mL, Rfl: 5   carvedilol (COREG) 12.5 MG tablet, Take 1 tablet (12.5 mg total) by mouth 2 (two) times daily with a meal., Disp: 60 tablet, Rfl: 1   carvedilol (COREG) 25 MG tablet, Take 1 tablet (25 mg total) by mouth 2 (two) times daily with a meal., Disp: 60 tablet, Rfl: 3   Fluticasone-Umeclidin-Vilant (TRELEGY ELLIPTA) 100-62.5-25 MCG/ACT AEPB, Inhale 1 puff into the lungs daily., Disp: 60 each, Rfl: 1   gabapentin (NEURONTIN) 300 MG capsule, Take 2 capsules (600 mg total) by mouth 3 (three) times daily., Disp: 180 capsule, Rfl: 1   isosorbide-hydrALAZINE (BIDIL) 20-37.5 MG tablet, Take 1 tablet by mouth 3 (three) times daily., Disp: 90 tablet, Rfl: 1   nicotine (NICODERM CQ - DOSED IN MG/24 HOURS) 21 mg/24hr patch, Place 1 patch (21 mg total) onto the skin daily as needed (Tobacco craving)., Disp: 28 patch, Rfl: 0   oxyCODONE (ROXICODONE) 5 MG immediate release tablet, Take 1 tablet (5 mg total) by mouth every 8 (eight)  hours as needed for moderate pain (pain score 4-6) or breakthrough pain., Disp: 25 tablet, Rfl: 0   pantoprazole (PROTONIX) 40 MG tablet, Take 1 tablet (40 mg total) by mouth daily., Disp: 30 tablet, Rfl: 1   predniSONE (DELTASONE) 10 MG tablet, Take 60 mg (6tabs) by mouth once daily for 3 days, then 40mg  (4tabs)once daily for 3 days, then 30mg  (3tabs) once daily for 3 days, then 20mg  (2tabs) once daily for 3 days, then 10mg  (1tab) once daily  for 3 days and stop., Disp: 48 tablet, Rfl: 0   tiotropium (SPIRIVA HANDIHALER) 18 MCG inhalation capsule, Place 1 capsule (18 mcg total) into inhaler and inhale daily., Disp: 30 capsule, Rfl: 5   torsemide (DEMADEX) 20 MG tablet, Take 2 tablets (40 mg total) by mouth daily., Disp: 60 tablet, Rfl: 1  Vitals  There were no vitals filed for this visit.  There is no height or weight on file to calculate BMI.  Physical Exam   Constitutional: Appears well-developed and well-nourished. Psych: Affect appropriate to situation.  Eyes: No scleral injection.  HENT: No OP obstruction.  Head: Normocephalic.  Cardiovascular: Normal rate and regular rhythm.  Respiratory: Effort normal, non-labored breathing.  GI: Soft.  No distension. There is no tenderness.  Skin: WDI.   Neurologic Examination   Mental Status -  He is awake alert with Global aphasia, speech is very dysarthric and incomprehensible  Cranial Nerves II - XII - II - Visual field right hemianopia III, IV, VI - Extraocular movements intact. V - Facial sensation intact bilaterally. VII -right facial droop VIII - Hearing & vestibular intact bilaterally. X - Palate elevates symmetrically. XI - Chin turning & shoulder shrug intact bilaterally. XII - Tongue protrusion intact.  Motor Strength -right arm r withdraws to noxious stimuli.  Right stump is flaccid, left arm and left stump normal strength Motor Tone - Muscle tone was assessed at the neck and appendages and was normal. Sensory -response to  noxious stimuli as above Coordination -unable to assess Gait and Station - deferred.  Labs/Imaging/Neurodiagnostic studies   CBC:  Recent Labs  Lab 03-06-2024 1436  WBC 6.6  NEUTROABS 4.2  HGB 14.3  15.0  HCT 43.8  44.0  MCV 93.8  PLT 210   Basic Metabolic Panel:  Lab Results  Component Value Date   NA 141 03/06/2024   K 3.6 03/06/24   CO2 24 01/16/2024   GLUCOSE 136 (H) March 06, 2024   BUN 19 2024-03-06   CREATININE 1.00 2024/03/06   CALCIUM 8.9 01/16/2024   GFRNONAA 53 (L) 01/16/2024   GFRAA >60 08/21/2020   Lipid Panel:  Lab Results  Component Value Date   LDLCALC 45 Mar 06, 2022   HgbA1c:  Lab Results  Component Value Date   HGBA1C 5.8 (H) 01/02/2024   Urine Drug Screen:     Component Value Date/Time   LABOPIA NONE DETECTED 01/14/2024 0907   COCAINSCRNUR POSITIVE (A) 01/14/2024 0907   COCAINSCRNUR POSITIVE (A) 12/25/2016 1533   LABBENZ NONE DETECTED 01/14/2024 0907   AMPHETMU NONE DETECTED 01/14/2024 0907   THCU NONE DETECTED 01/14/2024 0907   LABBARB NONE DETECTED 01/14/2024 0907    Alcohol Level     Component Value Date/Time   ETH <10 2024-03-06 1436   INR  Lab Results  Component Value Date   INR 1.5 (H) March 06, 2024   APTT  Lab Results  Component Value Date   APTT 30 03/06/2024   AED levels: No results found for: "PHENYTOIN", "ZONISAMIDE", "LAMOTRIGINE", "LEVETIRACETA"  CT Head without contrast(Personally reviewed): Acute left MCA infarct, aspects 6.  Asymmetric hyperdensity of left M1/M2  CT angio Head and Neck with contrast(Personally reviewed):  Acute occlusion of a proximal M2 left middle cerebral artery vessel.    ASSESSMENT   Azlaan Isidore is a 58 y.o. male hx of CHF, COPD, diabetes,  GERD, hypertension, schizoaffective disorder, bipolar disorder, cocaine use, EtOH use, tobacco use, depression, bilateral BKA, chronic pain who presents to [facility] via EMS from home.  Per EMS his caregiver was with him when she noticed  slurred speech and confusion.  EMS noted right arm weakness and right leg weakness. CT head with acute left MCA infarct, CTA with left M2 occlusion who presents outside the window for TNK or thrombectomy   Limited echo done today revealed Large, mobile LV thrombus, LA thrombus and RV apical thrombus EF < 20%. Typically in acute strokes anticoagulation is held for at least 48 hrs depending on the size of the stroke burden to avoid hemorrhagic conversion, however risk vs benefits in this case indicates that he has a very high risk for further emboli and due to this we have agreed to start [facility] and have discussed with cardiology and primary team Dr. Artis Flock. He is at high risk for further neurological insult with starting heparin gtt and recommend he get admitted to the Neuro ICU for close neuro monitoring    RECOMMENDATIONS  -Admit for stroke workup  - HgbA1c, fasting lipid panel - MRI of the brain without contrast - Frequent neuro checks - Echocardiogram - Prophylactic therapy-Antiplatelet med: Aspirin - dose 81mg  and plavix 75mg  daily  - Risk factor modification - Telemetry monitoring - PT consult, OT consult, Speech consult - UDS - Stroke team to follow ______________________________________________________________________    Signed, Mathews Argyle, NP [facility]  I personally evaluated patient and agree with assessment and plan with the following addition.  Patient came in with right-sided deficits and aphasia, was found to have left M2 occlusion but was not a candidate for TNK or thrombectomy due to last known normal of greater than 26 hours. Limited echo done today revealed Large, mobile LV thrombus, LA thrombus and RV apical thrombus EF < 20%.  Usually anticoagulation is held to prevent hemorrhagic transformation for at least 48 hours depending on size of stroke on MRI.  However the risks of further emboli, in the brain and systemic, are greater.  Extensive risk versus benefit  discussion was held with the cardiology team, Dr. Cristal Deer, and the primary team, Dr. Artis Flock and we came to the agreement that benefits of preventing further emboli with heparin outweigh risks of hemorrhagic conversion with heparin.  Patient will be started on heparin and be moved into the ICU.for closer monitoring for potential hemorrhagic transformation.  Anibal Henderson, MD [facility]

## 2024-03-04 NOTE — Assessment & Plan Note (Signed)
 Start back abilify when he can safely swallow May need IM prn if becomes agitated

## 2024-03-04 NOTE — ED Triage Notes (Signed)
 Pt BIBA from home w/ c/o R sided weakness, dysarthia, and unable to follow commands. Pt LTW was 1300 yesterday. Pt has an extensive medical hx. VS stable w/ HTN the only abnormal.

## 2024-03-04 NOTE — Progress Notes (Signed)
 PHARMACY - ANTICOAGULATION CONSULT NOTE  Pharmacy Consult for Heparin Indication: LV thrombus  Allergies  Allergen Reactions   Acetaminophen Hives, Nausea And Vomiting, Rash and Anaphylaxis    PT states that if he takes this he is going to the hospital   Motrin [Ibuprofen] Anaphylaxis   Nsaids Anaphylaxis    PT states that if he takes this he is going to the hospital   Lyrica Cr [Pregabalin Er] Other (See Comments)    Dystonia Dysarthria    Peanut-Containing Drug Products Other (See Comments)    Pt denies allergy to peanut butter 03/02/2021    Patient Measurements: Height: 4\' 2"  (127 cm) Weight: 77 kg (169 lb 12.1 oz) IBW/kg (Calculated) : 27 HEPARIN DW (KG): 46.7  Vital Signs: Temp: 98 F (36.7 C) (04/04 2000) Temp Source: Oral (04/04 2000) BP: 147/118 (04/04 1948) Pulse Rate: 98 (04/04 2145)  Labs: Recent Labs    03/04/24 1436  HGB 14.3  15.0  HCT 43.8  44.0  PLT 210  APTT 30  LABPROT 18.1*  INR 1.5*  CREATININE 1.04  1.00    Estimated Creatinine Clearance: 55.5 mL/min (by C-G formula based on SCr of 1 mg/dL).   Medical History: Past Medical History:  Diagnosis Date   Asthma    Bipolar 1 disorder (HCC)    CHF (congestive heart failure) (HCC)    COPD (chronic obstructive pulmonary disease) (HCC)    Depression    Diabetes mellitus without complication (HCC)    GERD (gastroesophageal reflux disease)    Homelessness    Hypertension    QT prolongation    Schizoaffective disorder (HCC)     Medications:  Scheduled:   [START ON 03/05/2024]  stroke: early stages of recovery book   Does not apply Once   arformoterol  15 mcg Nebulization BID   budesonide (PULMICORT) nebulizer solution  0.5 mg Nebulization BID   nicotine  14 mg Transdermal Daily   pantoprazole (PROTONIX) IV  40 mg Intravenous Q24H   [START ON 03/05/2024] revefenacin  175 mcg Nebulization Daily   Infusions:  PRN: albuterol, metoprolol tartrate  Assessment: 58 yo male presents with acute  left MCA CVA. 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 CBC wnl. Not on [facility] prior to admission.  Goal of Therapy:  Heparin level 0.3-0.5 units/ml Monitor platelets by anticoagulation protocol: Yes   Plan:  No heparin bolus Heparin infusion 750 units/hr Check heparin level 8hrs after starting and daily Continue to monitor H&H and platelets  Loralee Pacas, PharmD, BCPS 03/04/2024,9:55 PM

## 2024-03-04 NOTE — ED Notes (Signed)
 Pt in MRI at time of assumption of care.

## 2024-03-04 NOTE — Assessment & Plan Note (Addendum)
 Chronic pain -continue the use of gabapentin, oxycodone and muscle relaxants when can safely swallow PRN IV morphine for now

## 2024-03-04 NOTE — Assessment & Plan Note (Signed)
 Optimize electrolytes Keep on telemetry Avoid qt prolonging drugs  Repeat ekg in AM

## 2024-03-04 NOTE — Assessment & Plan Note (Signed)
 58 year old male presenting to ED after being found with new onset right sided weakness and slurred speech found to have acute left MCA territory infarct by CT with right sided hemiplegia and severe dysarthric and aphasic speech.  -admit to progressive for stroke work up in setting of large mobile thrombus in LV, RV and LA  -Neurochecks per protocol -Neurology consulted -MRI brain without contrast pending  -lipid panel pending, A1C of 5.8  -Permissive hypertension first 24 hours <220/110 -high aspiration risk, failed bedside swallow. Oral dysphagia and can not swallow so unsure about laryngeal elevation. Aspiration precautions, ST swallow eval  -PT/ OT/ SLP consult -SW consult likely needs inpatient rehab

## 2024-03-04 NOTE — ED Notes (Addendum)
 Report called to [facility], Pt going to [id number]

## 2024-03-04 NOTE — Progress Notes (Signed)
 Cardiology notified about clots in the heart.

## 2024-03-04 NOTE — Assessment & Plan Note (Addendum)
 Large thrombus of LV, near LV outflow tract as well as mobile clots in RV and LA  Discussed with neurology and typically do not AC for 48 hours following moderate size stroke Brain MRI pending: need this back to help decision making  Consult cardiology for recs on [facility] with risk for embolization/death with mobile thromboses vs. Bleed in setting of acute moderate sized CVA  Attempted to discuss situation with patient and he became agitated and started to talk loudly to me. Unsure if he is grasping the complexity and possible grave outcome of the situation/findings at hand

## 2024-03-04 NOTE — Progress Notes (Signed)
  Echocardiogram 2D Echocardiogram has been performed.  Delcie Roch 03/04/2024, 4:56 PM

## 2024-03-04 NOTE — Assessment & Plan Note (Signed)
 Admits to ongoing use.  Last used a few days ago  Likely contributing to severe systolic CHF

## 2024-03-04 NOTE — ED Notes (Signed)
 Nt called CCMD to put pt on monitor

## 2024-03-04 NOTE — Consult Note (Addendum)
 NAME:  [name], MRN:  [id number], DOB:  [birth year], LOS: 0 ADMISSION DATE:  03/04/2024, CONSULTATION DATE:  03/04/2024  REFERRING MD:  Orland Mustard , CHIEF COMPLAINT: Altered mental status  History of Present Illness:  58 year old man with cardiomyopathy and active cocaine use brought in as a code stroke by EMS. LKN around 12 PM.  His caregiver Inetta Fermo noted slurred speech and confusion.  Right arm and right leg weakness was noted by EMS neurology evaluation showed global aphasia and dysarthria with right facial droop, NIHSS was 20 Head CT showed acute left MCA infarct CTA head and neck showed left M2 occlusion Echo showed large mobile LV thrombus, LA thrombus and RV apical thrombus, EF was less than 20%.  Neurology and cardiology consulting and due to high risk of further emboli, decision made to anticoagulate and admit to ICU.  LV thrombus is more than 3 cm in size  He has a history of repeated admissions for heart failure and COPD exacerbation at least 3 this year. He has a history of active cocaine use  Pertinent  Medical History  Asthma, COPD,  GERD,  HTN , prolonged QT, Depression,  Schizoaffective d/o,  HFref, ,E <20%   DMII, s/p b/l lower extremity amputee,  chronic pain and polysubstance abuse - active cocaine use.   Significant Hospital Events: Including procedures, antibiotic start and stop dates in addition to other pertinent events   MRI brain 4/4>>  Interim History / Subjective:  On 2 L nasal cannula Sitting up in ED stretcher, no distress  Objective   Blood pressure (!) 147/118, pulse 94, temperature 97.8 F (36.6 C), temperature source Axillary, resp. rate 16, height 4\' 2"  (1.27 m), weight 77 kg, SpO2 100%.       No intake or output data in the 24 hours ending 03/04/24 2013 Filed Weights   03/04/24 1550  Weight: 77 kg    Examination: General: Well-built adult man, eyes closed, no distress HENT: Pupils 2 mm reactive to light, no JVD, no carotid  bruits Lungs: Clear to auscultation, no accessory muscle use Cardiovascular: S1-S2 distant, no S3 Abdomen: Soft, nontender no paraspinal megaly Extremities: Cool hands with feeble radial pulses bilateral AKA Neuro: Aphasia, one-step commands, weak right arm power 2/5 and right leg   Labs show hypomagnesemia, glucose 136, no leukocytosis, mildly elevated LFTs, INR 1.5  Resolved Hospital Problem list     Assessment & Plan:  Acute left MCA CVA -MRI brain pending Likely embolic, outside window for TNK or thrombectomy Swallow evaluation  RV/LA/LV thrombus -high risk embolising, IV heparin benefit outweighs risk of hemorrhagic transformation No family available to have risk-benefit discussion Etiology unclear -very unusual presentation of both venous and arterial thrombi, will check venous duplex, no evidence of PFO, may need hypercoagulability workup  Hypertension -allow permissive hypertension up to 180/110 Check UDS for completion Hold Coreg if positive for cocaine  HFrEF -hold torsemide, resume when able to take oral. IV Lasix if needed  Schizoaffective -resume Abilify if able to take oral  COPD -triple nebs during hospitalization Albuterol for rescue  Chronic pain -resume oxycodone and Neurontin when able   Best Practice (right click and "Reselect all SmartList Selections" daily)   Diet/type: NPO DVT prophylaxis systemic heparin Pressure ulcer(s): N/A GI prophylaxis: PPI Lines: N/A Foley:  N/A Code Status:  full code Last date of multidisciplinary goals of care discussion [no family available currently] -attempted to reach [employer], contact person listed in the chart.  Spoke to caregiver Sheralyn Boatman  was been with him for 3 weeks but she does not have contact for family  Labs   CBC: Recent Labs  Lab 03/04/24 1436  WBC 6.6  NEUTROABS 4.2  HGB 14.3  15.0  HCT 43.8  44.0  MCV 93.8  PLT 210    Basic Metabolic Panel: Recent Labs  Lab 03/04/24 1436  03/04/24 1621  NA 138  141  --   K 3.7  3.6  --   CL 108  109  --   CO2 19*  --   GLUCOSE 136*  136*  --   BUN 17  19  --   CREATININE 1.04  1.00  --   CALCIUM 7.9*  --   MG  --  1.3*   GFR: Estimated Creatinine Clearance: 55.5 mL/min (by C-G formula based on SCr of 1 mg/dL). Recent Labs  Lab 03/04/24 1436  WBC 6.6    Liver Function Tests: Recent Labs  Lab 03/04/24 1436  AST 59*  ALT 87*  ALKPHOS 67  BILITOT 2.4*  PROT 5.7*  ALBUMIN 2.9*   No results for input(s): "LIPASE", "AMYLASE" in the last 168 hours. No results for input(s): "AMMONIA" in the last 168 hours.  ABG    Component Value Date/Time   PHART 7.45 07/13/2022 2028   PCO2ART 39 07/13/2022 2028   PO2ART 403 (H) 07/13/2022 2028   HCO3 25.4 01/02/2024 2315   TCO2 20 (L) 03/04/2024 1436   ACIDBASEDEF 1.0 08/16/2022 1939   O2SAT 100 01/02/2024 2315     Coagulation Profile: Recent Labs  Lab 03/04/24 1436  INR 1.5*    Cardiac Enzymes: No results for input(s): "CKTOTAL", "CKMB", "CKMBINDEX", "TROPONINI" in the last 168 hours.  HbA1C: Hemoglobin A1C  Date/Time Value Ref Range Status  08/04/2023 03:24 PM 11.2 (A) 4.0 - 5.6 % Final   Hgb A1c MFr Bld  Date/Time Value Ref Range Status  01/02/2024 12:32 PM 5.8 (H) 4.8 - 5.6 % Final    Comment:    (NOTE) Pre diabetes:          5.7%-6.4%  Diabetes:              >6.4%  Glycemic control for   <7.0% adults with diabetes   12/07/2022 09:27 PM 6.1 (H) 4.8 - 5.6 % Final    Comment:    (NOTE)         Prediabetes: 5.7 - 6.4         Diabetes: >6.4         Glycemic control for adults with diabetes: <7.0     CBG: Recent Labs  Lab 03/04/24 1430  GLUCAP 138*    Review of Systems:   Unable to obtain due to altered mental status/confusion  Past Medical History:  He,  has a past medical history of Asthma, Bipolar 1 disorder (HCC), CHF (congestive heart failure) (HCC), COPD (chronic obstructive pulmonary disease) (HCC), Depression, Diabetes  mellitus without complication (HCC), GERD (gastroesophageal reflux disease), Homelessness, Hypertension, QT prolongation, and Schizoaffective disorder (HCC).   Surgical History:   Past Surgical History:  Procedure Laterality Date   AMPUTATION Left 12/10/2022   Procedure: LEFT BELOW KNEE AMPUTATION;  Surgeon: Nadara Mustard, MD;  Location: [facility];  Service: Orthopedics;  Laterality: Left;   AMPUTATION TOE Bilateral 01/02/2021   Procedure: AMPUTATION TOE;  Surgeon: Park Liter, DPM;  Location: [facility] OR;  Service: Podiatry;  Laterality: Bilateral;  Ok to leave patient on hospital bed   IR ANGIO INTRA EXTRACRAN  SEL COM CAROTID INNOMINATE BILAT MOD SED  12/17/2020   IR ANGIO VERTEBRAL SEL VERTEBRAL BILAT MOD SED  12/17/2020   MULTIPLE EXTRACTIONS WITH ALVEOLOPLASTY N/A 03/25/2023   Procedure: IRRIGATION AND DEBRIDEMENT, MULTIPLE EXTRACTION WITH ALVEOLOPLASTY X 4 , EXTRACTIONS 4 THROUGH 13, 20 THROUGHT 29;  Surgeon: Vivia Ewing, DMD;  Location: [facility] OR;  Service: Oral Surgery;  Laterality: N/A;   RADIOLOGY WITH ANESTHESIA N/A 12/17/2020   Procedure: IR WITH ANESTHESIA;  Surgeon: Radiologist, Medication, MD;  Location: [facility] OR;  Service: Radiology;  Laterality: N/A;     Social History:   reports that he has been smoking cigarettes. He has never used smokeless tobacco. He reports current alcohol use. He reports current drug use. Drug: Cocaine.   Family History:  His family history includes Drug abuse in his mother; Suicidality in his mother.   Allergies Allergies  Allergen Reactions   Acetaminophen Hives, Nausea And Vomiting, Rash and Anaphylaxis    PT states that if he takes this he is going to the hospital   Motrin [Ibuprofen] Anaphylaxis   Nsaids Anaphylaxis    PT states that if he takes this he is going to the hospital   Lyrica Cr [Pregabalin Er] Other (See Comments)    Dystonia Dysarthria    Peanut-Containing Drug Products Other (See Comments)    Pt denies allergy to peanut butter 03/02/2021      Home Medications  Prior to Admission medications   Medication Sig Start Date End Date Taking? Authorizing Provider  albuterol (ACCUNEB) 1.25 MG/3ML nebulizer solution Take 3 mLs (1.25 mg total) by nebulization every 6 (six) hours as needed for wheezing. 01/21/24   Georganna Skeans, MD  albuterol (VENTOLIN HFA) 108 (90 Base) MCG/ACT inhaler Inhale 2 puffs into the lungs every 4 (four) hours as needed for wheezing or shortness of breath. 01/18/24   Berton Mount I, MD  ARIPiprazole (ABILIFY) 5 MG tablet Take 1 tablet (5 mg total) by mouth daily. 01/18/24   Berton Mount I, MD  budesonide (PULMICORT) 0.25 MG/2ML nebulizer solution Take 2 mLs (0.25 mg total) by nebulization daily. 01/21/24   Georganna Skeans, MD  carvedilol (COREG) 12.5 MG tablet Take 1 tablet (12.5 mg total) by mouth 2 (two) times daily with a meal. 01/18/24 03/04/24  Barnetta Chapel, MD  carvedilol (COREG) 25 MG tablet Take 1 tablet (25 mg total) by mouth 2 (two) times daily with a meal. 01/21/24   Georganna Skeans, MD  Fluticasone-Umeclidin-Vilant (TRELEGY ELLIPTA) 100-62.5-25 MCG/ACT AEPB Inhale 1 puff into the lungs daily. 01/18/24   Barnetta Chapel, MD  gabapentin (NEURONTIN) 300 MG capsule Take 2 capsules (600 mg total) by mouth 3 (three) times daily. 01/18/24 03/18/24  Barnetta Chapel, MD  isosorbide-hydrALAZINE (BIDIL) 20-37.5 MG tablet Take 1 tablet by mouth 3 (three) times daily. 01/18/24   Barnetta Chapel, MD  nicotine (NICODERM CQ - DOSED IN MG/24 HOURS) 21 mg/24hr patch Place 1 patch (21 mg total) onto the skin daily as needed (Tobacco craving). 01/18/24   Barnetta Chapel, MD  oxyCODONE (ROXICODONE) 5 MG immediate release tablet Take 1 tablet (5 mg total) by mouth every 8 (eight) hours as needed for moderate pain (pain score 4-6) or breakthrough pain. 01/18/24 01/17/25  Berton Mount I, MD  pantoprazole (PROTONIX) 40 MG tablet Take 1 tablet (40 mg total) by mouth daily. 01/18/24   Barnetta Chapel, MD   predniSONE (DELTASONE) 10 MG tablet Take 60 mg (6tabs) by mouth once daily for 3 days, then  40mg  (4tabs)once daily for 3 days, then 30mg  (3tabs) once daily for 3 days, then 20mg  (2tabs) once daily for 3 days, then 10mg  (1tab) once daily for 3 days and stop. 01/18/24   Barnetta Chapel, MD  tiotropium (SPIRIVA HANDIHALER) 18 MCG inhalation capsule Place 1 capsule (18 mcg total) into inhaler and inhale daily. 01/21/24   Georganna Skeans, MD  torsemide (DEMADEX) 20 MG tablet Take 2 tablets (40 mg total) by mouth daily. 01/18/24   Barnetta Chapel, MD     My independent Critical care time was 40 minutes  Cyril Mourning MD. [facility]. [place] [facility] Pager : [phone]  If no response to pager , please call [phone] until 7 pm After 7:00 pm call Elink  [phone]   03/04/2024

## 2024-03-04 NOTE — Consult Note (Signed)
 Cardiology Consultation   Patient ID: [name] MRN: [id number]; DOB: 23-Mar-1966  Admit date: 03/04/2024 Date of Consult: 03/04/2024  PCP:  Georganna Skeans, MD   [facility] Providers Cardiologist:  Rollene Rotunda, MD        Patient Profile:   [name] is a 58 y.o. male with a hx of cardiomyopathy, CAD, CVA, type II diabetes, amputation who is being seen 03/04/2024 for the evaluation of LV thrombus at the request of Dr. Artis Flock.  History of Present Illness:   [name] is currently agitated and dysarthric and cannot give a history. History taken from chart. Caregiver today noted on their arrival that patient was slumped over, with slurred speech, confusion, and weakness. EMS activated. On arrival, evaluated by neurology team, found to have NIH stroke scale of 20. Last known normal around early afternoon yesterday.  CTA with acute left MCA infarct. MRI pending at this time. Echo was done, noting large mobile LV thrombus, LA thrombus, and RV thrombus (see images below). Cardiology consulted given these findings.  Past Medical History:  Diagnosis Date   Asthma    Bipolar 1 disorder (HCC)    CHF (congestive heart failure) (HCC)    COPD (chronic obstructive pulmonary disease) (HCC)    Depression    Diabetes mellitus without complication (HCC)    GERD (gastroesophageal reflux disease)    Homelessness    Hypertension    QT prolongation    Schizoaffective disorder (HCC)     Past Surgical History:  Procedure Laterality Date   AMPUTATION Left 12/10/2022   Procedure: LEFT BELOW KNEE AMPUTATION;  Surgeon: Nadara Mustard, MD;  Location: [facility] OR;  Service: Orthopedics;  Laterality: Left;   AMPUTATION TOE Bilateral 01/02/2021   Procedure: AMPUTATION TOE;  Surgeon: Park Liter, DPM;  Location: [facility] OR;  Service: Podiatry;  Laterality: Bilateral;  Ok to leave patient on hospital bed   IR ANGIO INTRA EXTRACRAN SEL COM CAROTID INNOMINATE BILAT MOD SED  12/17/2020   IR ANGIO  VERTEBRAL SEL VERTEBRAL BILAT MOD SED  12/17/2020   MULTIPLE EXTRACTIONS WITH ALVEOLOPLASTY N/A 03/25/2023   Procedure: IRRIGATION AND DEBRIDEMENT, MULTIPLE EXTRACTION WITH ALVEOLOPLASTY X 4 , EXTRACTIONS 4 THROUGH 13, 20 THROUGHT 29;  Surgeon: Vivia Ewing, DMD;  Location: [facility] OR;  Service: Oral Surgery;  Laterality: N/A;   RADIOLOGY WITH ANESTHESIA N/A 12/17/2020   Procedure: IR WITH ANESTHESIA;  Surgeon: Radiologist, Medication, MD;  Location: [facility] OR;  Service: Radiology;  Laterality: N/A;     Home Medications:  Prior to Admission medications   Medication Sig Start Date End Date Taking? Authorizing Provider  albuterol (ACCUNEB) 1.25 MG/3ML nebulizer solution Take 3 mLs (1.25 mg total) by nebulization every 6 (six) hours as needed for wheezing. 01/21/24   Georganna Skeans, MD  albuterol (VENTOLIN HFA) 108 (90 Base) MCG/ACT inhaler Inhale 2 puffs into the lungs every 4 (four) hours as needed for wheezing or shortness of breath. 01/18/24   Berton Mount I, MD  ARIPiprazole (ABILIFY) 5 MG tablet Take 1 tablet (5 mg total) by mouth daily. 01/18/24   Berton Mount I, MD  budesonide (PULMICORT) 0.25 MG/2ML nebulizer solution Take 2 mLs (0.25 mg total) by nebulization daily. 01/21/24   Georganna Skeans, MD  carvedilol (COREG) 12.5 MG tablet Take 1 tablet (12.5 mg total) by mouth 2 (two) times daily with a meal. 01/18/24 03/04/24  Barnetta Chapel, MD  carvedilol (COREG) 25 MG tablet Take 1 tablet (25 mg total) by mouth 2 (two) times  daily with a meal. 01/21/24   Georganna Skeans, MD  Fluticasone-Umeclidin-Vilant (TRELEGY ELLIPTA) 100-62.5-25 MCG/ACT AEPB Inhale 1 puff into the lungs daily. 01/18/24   Barnetta Chapel, MD  gabapentin (NEURONTIN) 300 MG capsule Take 2 capsules (600 mg total) by mouth 3 (three) times daily. 01/18/24 03/18/24  Barnetta Chapel, MD  isosorbide-hydrALAZINE (BIDIL) 20-37.5 MG tablet Take 1 tablet by mouth 3 (three) times daily. 01/18/24   Barnetta Chapel, MD  nicotine (NICODERM  CQ - DOSED IN MG/24 HOURS) 21 mg/24hr patch Place 1 patch (21 mg total) onto the skin daily as needed (Tobacco craving). 01/18/24   Barnetta Chapel, MD  oxyCODONE (ROXICODONE) 5 MG immediate release tablet Take 1 tablet (5 mg total) by mouth every 8 (eight) hours as needed for moderate pain (pain score 4-6) or breakthrough pain. 01/18/24 01/17/25  Berton Mount I, MD  pantoprazole (PROTONIX) 40 MG tablet Take 1 tablet (40 mg total) by mouth daily. 01/18/24   Barnetta Chapel, MD  predniSONE (DELTASONE) 10 MG tablet Take 60 mg (6tabs) by mouth once daily for 3 days, then 40mg  (4tabs)once daily for 3 days, then 30mg  (3tabs) once daily for 3 days, then 20mg  (2tabs) once daily for 3 days, then 10mg  (1tab) once daily for 3 days and stop. 01/18/24   Barnetta Chapel, MD  tiotropium (SPIRIVA HANDIHALER) 18 MCG inhalation capsule Place 1 capsule (18 mcg total) into inhaler and inhale daily. 01/21/24   Georganna Skeans, MD  torsemide (DEMADEX) 20 MG tablet Take 2 tablets (40 mg total) by mouth daily. 01/18/24   Barnetta Chapel, MD    Inpatient Medications: Scheduled Meds:  [START ON 03/05/2024]  stroke: early stages of recovery book   Does not apply Once   fluticasone furoate-vilanterol  1 puff Inhalation Daily   And   umeclidinium bromide  1 puff Inhalation Daily   pantoprazole (PROTONIX) IV  40 mg Intravenous Q24H   Continuous Infusions:  PRN Meds: albuterol, metoprolol tartrate, morphine injection  Allergies:    Allergies  Allergen Reactions   Acetaminophen Hives, Nausea And Vomiting, Rash and Anaphylaxis    PT states that if he takes this he is going to the hospital   Motrin [Ibuprofen] Anaphylaxis   Nsaids Anaphylaxis    PT states that if he takes this he is going to the hospital   Lyrica Cr [Pregabalin Er] Other (See Comments)    Dystonia Dysarthria    Peanut-Containing Drug Products Other (See Comments)    Pt denies allergy to peanut butter 03/02/2021    Social History:    Social History   Socioeconomic History   Marital status: Single    Spouse name: Not on file   Number of children: 4   Years of education: Not on file   Highest education level: Not on file  Occupational History   Occupation: N/A  Tobacco Use   Smoking status: Some Days    Types: Cigarettes   Smokeless tobacco: Never  Vaping Use   Vaping status: Never Used  Substance and Sexual Activity   Alcohol use: Yes   Drug use: Yes    Types: Cocaine   Sexual activity: Not Currently    Partners: Female    Birth control/protection: None  Other Topics Concern   Not on file  Social History Narrative   ** Merged History Encounter **       Social Drivers of Health   Financial Resource Strain: High Risk (07/24/2021)   Received from [facility], [facility]   Overall Financial Resource Strain (CARDIA)    Difficulty of Paying Living Expenses: Hard  Food Insecurity: Food Insecurity Present (01/21/2024)   Hunger Vital Sign    Worried About Running Out of Food in the Last Year: Sometimes true    Ran Out of Food in the Last Year: Sometimes true  Transportation Needs: No Transportation Needs (01/21/2024)   PRAPARE - Administrator, Civil Service (Medical): No    Lack of Transportation (Non-Medical): No  Physical Activity: Inactive (02/18/2024)   Exercise Vital Sign    Days of Exercise per Week: 0 days    Minutes of Exercise per Session: 0 min  Stress: Stress Concern Present (07/24/2021)   Received from [facility], [facility] - Occupational Stress Questionnaire    Feeling of Stress : To some extent  Social Connections: Unknown (04/05/2022)   Received from [facility], [facility]   Social Network    Social Network: Not on file  Intimate Partner Violence: Patient Declined (01/14/2024)   Humiliation, Afraid, Rape, and Kick questionnaire    Fear of Current or Ex-Partner: Patient declined    Emotionally Abused: Patient declined     Physically Abused: Patient declined    Sexually Abused: Patient declined    Family History:    Family History  Problem Relation Age of Onset   Drug abuse Mother    Suicidality Mother      ROS:  Unable to obtain at this time due to acute stroke  Physical Exam/Data:   Vitals:   03/04/24 1530 03/04/24 1550 03/04/24 1552 03/04/24 1802  BP:   (!) 171/102 129/87  Pulse:   85   Resp:   (!) 27 14  Temp: 98 F (36.7 C)  98.2 F (36.8 C)   TempSrc:   Oral   SpO2:   92%   Weight:  77 kg    Height:  4\' 2"  (1.27 m)     No intake or output data in the 24 hours ending 03/04/24 1913    03/04/2024    3:50 PM 02/18/2024    8:13 AM 01/21/2024    1:51 PM  Last 3 Weights  Weight (lbs) 169 lb 12.1 oz -- --  Weight (kg) 77 kg -- --     Body mass index is 47.74 kg/m.  General:  somewhat agitated, dysarthric HEENT: R facial droop Neck: JVD not well visualized Vascular: bilateral 2+ radial pulses Cardiac:  normal S1, S2; RRR; no murmur  Lungs:  clear to auscultation bilaterally, no wheezing, rhonchi or rales. Upper airway noise Abd: soft, nontender, nondistended Ext: bilateral BKA Skin: warm and dry  Neuro:  deferred formal evaluation to neurology team. Dysarthric, facial droop, R sided weakness UE and LE  EKG:  The EKG was personally reviewed and demonstrates:  sinus rhythm, LVH with repol Telemetry:  Telemetry was personally reviewed and demonstrates:  sinus rhythm  Relevant CV Studies: Echo reviewed, see below  Laboratory Data:  High Sensitivity Troponin:  No results for input(s): "TROPONINIHS" in the last 720 hours.   Chemistry Recent Labs  Lab 03/04/24 1436 03/04/24 1621  NA 138  141  --   K 3.7  3.6  --   CL 108  109  --   CO2 19*  --   GLUCOSE 136*  136*  --   BUN 17  19  --   CREATININE 1.04  1.00  --   CALCIUM  7.9*  --   MG  --  1.3*  GFRNONAA >60  --   ANIONGAP 11  --     Recent Labs  Lab 03/04/24 1436  PROT 5.7*  ALBUMIN 2.9*  AST 59*  ALT  87*  ALKPHOS 67  BILITOT 2.4*   Lipids No results for input(s): "CHOL", "TRIG", "HDL", "LABVLDL", "LDLCALC", "CHOLHDL" in the last 168 hours.  Hematology Recent Labs  Lab 03/04/24 1436  WBC 6.6  RBC 4.67  HGB 14.3  15.0  HCT 43.8  44.0  MCV 93.8  MCH 30.6  MCHC 32.6  RDW 16.2*  PLT 210   Thyroid No results for input(s): "TSH", "FREET4" in the last 168 hours.  BNPNo results for input(s): "BNP", "PROBNP" in the last 168 hours.  DDimer No results for input(s): "DDIMER" in the last 168 hours.   Radiology/Studies:  ECHOCARDIOGRAM LIMITED Result Date: 03/04/2024    ECHOCARDIOGRAM REPORT   Patient Name:   DOZIER BERKOVICH Date of Exam: 03/04/2024 Medical Rec #:  [id number]      Height:       50.0 in Accession #:    [id number]     Weight:       169.8 lb Date of Birth:  12-01-1966      BSA:          1.525 m Patient Age:    57 years       BP:           171/102 mmHg Patient Gender: M              HR:           76 bpm. Exam Location:  Inpatient Procedure: Limited Echo and Color Doppler (Both Spectral and Color Flow Doppler            were utilized during procedure). Indications:    stroke  History:        Patient has prior history of Echocardiogram examinations, most                 recent 01/14/2024.  Sonographer:    Delcie Roch RDCS Referring Phys: [phone] DENISE A WOLFE IMPRESSIONS  1. The apex has prominen trabecualtions. There is a large mobile clot in the LV that appears to be attached to the interventricular septum just under the mitral valve. . Left ventricular ejection fraction, by estimation, is <20%. The left ventricle has severely decreased function. The left ventricle demonstrates global hypokinesis. Left ventricular diastolic function could not be evaluated.  2. There is a small RV apical clot. Right ventricular systolic function is severely reduced. The right ventricular size is normal.  3. There is a largr mobile clot in the LA. Left atrial size was mildly dilated.  4. Right atrial size  was mildly dilated.  5. The mitral valve is normal in structure. Trivial mitral valve regurgitation. No evidence of mitral stenosis.  6. The aortic valve is normal in structure. Aortic valve regurgitation is not visualized. No aortic stenosis is present. Conclusion(s)/Recommendation(s): Severe biventricular dysfunction with large mobile clots in the LA, RV and LV as described above. FINDINGS  Left Ventricle: The apex has prominen trabecualtions. There is a large mobile clot in the LV that appears to be attached to the interventricular septum just under the mitral valve. Left ventricular ejection fraction, by estimation, is <20%. The left ventricle has severely decreased function. The left ventricle demonstrates global hypokinesis. The left ventricular internal cavity size was normal in size. There is  no left ventricular hypertrophy. Left ventricular diastolic function could not be evaluated. Right Ventricle: There is a small RV apical clot. The right ventricular size is normal. No increase in right ventricular wall thickness. Right ventricular systolic function is severely reduced. Left Atrium: There is a largr mobile clot in the LA. Left atrial size was mildly dilated. Right Atrium: Right atrial size was mildly dilated. Pericardium: There is no evidence of pericardial effusion. Mitral Valve: The mitral valve is normal in structure. Trivial mitral valve regurgitation. No evidence of mitral valve stenosis. Tricuspid Valve: The tricuspid valve is normal in structure. Tricuspid valve regurgitation is trivial. No evidence of tricuspid stenosis. Aortic Valve: The aortic valve is normal in structure. Aortic valve regurgitation is not visualized. No aortic stenosis is present. Pulmonic Valve: The pulmonic valve was not well visualized. Pulmonic valve regurgitation is not visualized. No evidence of pulmonic stenosis. Aorta: The aortic root is normal in size and structure. Venous: The inferior vena cava was not well  visualized. IAS/Shunts: No atrial level shunt detected by color flow Doppler. Arvilla Meres MD Electronically signed by Arvilla Meres MD Signature Date/Time: 03/04/2024/4:59:08 PM    Final    CT ANGIO HEAD NECK W WO CM W PERF (CODE STROKE) Result Date: 03/04/2024 CLINICAL DATA:  Provided history: Neuro deficit, acute, stroke suspected. EXAM: CT ANGIOGRAPHY HEAD AND NECK CT PERFUSION BRAIN TECHNIQUE: Multidetector CT imaging of the head and neck was performed using the standard protocol during bolus administration of intravenous contrast. Multiplanar CT image reconstructions and MIPs were obtained to evaluate the vascular anatomy. Carotid stenosis measurements (when applicable) are obtained utilizing NASCET criteria, using the distal internal carotid diameter as the denominator. Multiphase CT imaging of the brain was performed following IV bolus contrast injection. Subsequent parametric perfusion maps were calculated using RAPID software. RADIATION DOSE REDUCTION: This exam was performed according to the departmental dose-optimization program which includes automated exposure control, adjustment of the mA and/or kV according to patient size and/or use of iterative reconstruction technique. CONTRAST:  Administered contrast not known at this time. COMPARISON:  Non-contrast head CT performed earlier today 03/04/2024. CT angiogram head/neck 02/03/2022. FINDINGS: CTA NECK FINDINGS Aortic arch: Standard aortic branching. A sclerotic plaque within the visualized thoracic aorta and proximal major branch vessels of the neck. No hemodynamically significant innominate or proximal subclavian artery stenosis. Right carotid system: CCA and ICA patent within the neck. Bulky predominately calcified aspect plaque about the carotid bifurcation with severe (greater than 90%) short-segment stenosis at the ICA origin. Noncalcified atherosclerotic plaque elsewhere within the mid and distal CCA resulting in less than 50% stenosis.  Partially retropharyngeal course of the cervical ICA. Left carotid system: CCA and ICA patent within the neck without hemodynamically significant stenosis (50% or greater). Mild sclerotic plaque within the mid CCA. Moderate sclerotic plaque about the carotid bifurcation and within the proximal ICA. Vertebral arteries: Vertebral arteries patent within the neck. Progressive severe stenosis of the left vertebral artery proximal V1 segment. Skeleton: The patient is edentulous. Cervical spondylosis. No acute fracture or aggressive osseous lesion. Other neck: No neck mass or cervical lymphadenopathy. Upper chest: No consolidation within the imaged lung apices. Centrilobular emphysema. Review of the MIP images confirms the above findings CTA HEAD FINDINGS Anterior circulation: The intracranial internal carotid arteries are patent. Atherosclerotic plaque within both vessels with no more than mild stenosis. The M1 middle cerebral arteries are patent. A proximal M2 left middle cerebral artery vessel is occluded at its origin, new from the CTA 02/03/2022 (for  instance as seen on series 11, image 25). No right M2 proximal branch occlusion or high-grade proximal stenosis is identified. The anterior cerebral arteries are patent. Developmentally hypoplastic or absent right A1 segment. No intracranial aneurysm is identified. Posterior circulation: The intracranial vertebral arteries are patent. Severe near occlusive stenoses of the left vertebral artery proximal to, at the level of, and distal to the PICA origin. These stenoses are new from the prior CTA of 02/03/2022. The basilar artery is patent. The posterior cerebral arteries are patent. Severe stenosis within a right PCA branch at the P3/P4 junction, new from the prior CTA. Posterior communicating arteries are hypoplastic or absent, bilaterally. Venous sinuses: Within the limitations of contrast timing, no convincing thrombus. Anatomic variants: As described. Review of the MIP  images confirms the above findings CT Brain Perfusion Findings: CBF (<30%) Volume: 0mL Perfusion (Tmax>6.0s) volume: (predominately within the left MCA vascular territory, although with some additional foci in the right cerebral watershed territory). Mismatch Volume: Infarction Location: None identified. However, an acute infarct was demonstrated within the left MCA territory on the non-contrast head CT performed earlier today. CTA head impression #1 called by telephone at the time of interpretation on 03/04/2024 at 3:26 pm to provider [facility] , who verbally acknowledged these results. IMPRESSION: CTA neck: 1. The common carotid and internal carotid arteries are patent with neck. Atherosclerotic plaque bilaterally, as described. Most notably, there is a severe (greater than 90%) stenosis at the right ICA origin which is similar to the prior CTA of 02/03/2022. 2. The vertebral arteries are patent in the neck. Progressive severe stenosis of the left vertebral artery proximal V1 segment. 3. Aortic Atherosclerosis (ICD10-I70.0) and Emphysema (ICD10-J43.9). CTA head: 1. Acute occlusion of a proximal M2 left middle cerebral artery vessel. 2. Background intracranial atherosclerotic disease with multifocal stenoses, most notably as follows. 3. 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 4. Severe stenosis of a right posterior cerebral artery branch at the P3/P4 junction, also new from the prior exam. CT perfusion head: The perfusion software identifies regions of hypoperfusion totaling 117 mL (utilizing the Tmax>6 seconds threshold). These foci are predominantly located within the left MCA vascular territory, although some foci are present within the right cerebral watershed territory. The perfusion software identifies no core infarct. However, an acute infarct was demonstrated on the non-contrast head CT performed earlier today, as  described. The perfusion software reports a mismatch volume of 117 mL. 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 Electronically Signed   By: Jackey Loge D.O.   On: 03/04/2024 15:45   CT HEAD CODE STROKE WO CONTRAST Result Date: 03/04/2024 CLINICAL DATA:  Code stroke. Neuro deficit, acute, stroke suspected. EXAM: CT HEAD WITHOUT CONTRAST TECHNIQUE: Contiguous axial images were obtained from the base of the skull through the vertex without intravenous contrast. RADIATION DOSE REDUCTION: This exam was performed according to the departmental dose-optimization program which includes automated exposure control, adjustment of the mA and/or kV according to patient size and/or use of iterative reconstruction technique. COMPARISON:  Non-contrast head CT, CT angiogram head/neck and CT venogram head 02/03/2022. FINDINGS: Brain: Mild generalized parenchymal atrophy. Acute left MCA territory infarct affecting the left insula, left caudate nucleus, left lentiform nucleus and left internal capsule. Background moderate patchy and ill-defined hypoattenuation within cerebral white matter, nonspecific but compatible with chronic small vessel  ischemic disease. 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 There is no acute intracranial hemorrhage. No extra-axial fluid collection. No evidence of an intracranial mass. No midline shift. Vascular: Asymmetric hyperdensity of the M1/proximal M2 left middle cerebral artery suspicious for the presence of thrombus. Atherosclerotic calcifications. Skull: No calvarial fracture or aggressive osseous lesion. Sinuses/Orbits: No mass or acute finding within the imaged orbits. Chronic medially displaced fracture deformity of the left lamina papyracea. No significant paranasal sinus disease at the imaged levels.  ASPECTS (Alberta Stroke Program Early CT Score) - Ganglionic level infarction (caudate, lentiform nuclei, internal capsule, insula, M1-M3 cortex): 3 - Supraganglionic infarction (M4-M6 cortex): 3 Total score (0-10 with 10 being normal): 6 Impressions #1 and #2 called by telephone at the time of interpretation on 03/04/2024 at 2:56 pm to provider Dr. Armanda Magic, who verbally acknowledged these results. IMPRESSION: 1. Acute left MCA territory infarct affecting the insula, caudate nucleus, lentiform nucleus and internal capsule. ASPECTS is 6. 2. Asymmetric hyperdensity of the M1/proximal M2 left middle cerebral artery suspicious for the presence of thrombus. A CTA of the head/neck is recommended for further evaluation. 3. Background parenchymal atrophy, chronic small vessel ischemic disease and chronic infarcts, as described. Electronically Signed   By: Jackey Loge D.O.   On: 03/04/2024 14:58     Assessment and Plan:   Large, mobile LV thrombus LA thrombus RV apical thrombus -I personally reviewed the images. Snapshots below. The LV thrombus in particular is highly mobile and located not far from LVOT. This is >3 cm in size.  -given his acute stroke, severe cardiomyopathy, he is at prohibitive risk for consideration of CT surgery. Arterial location precludes use of angiovac -timing of anticoagulation per neurology, see below. Even if anticoagulation can be started soon, there is still significant risk of embolization   LA, 2.0 x 1.1 cm    LV, 3.28 x 1.08 cm RV, 1.27 x 1.09 cm  Acute CVA -discussing with neuro team. Typically heparin is avoiding acutely, awaiting MRI to guide next steps. Start anticoagulation when able from neuro standpoint -reviewed CT angio, awaiting MRI  Cardiomyopathy -EF <20%. Unable to assess functional status due to acute stroke -unfortunately has never come to follow up in the cardiology office, seen during acute hospitalizations -unable to determine right now what  medications he is actually taking at home -will work on GDMT after life threatening issues are stable  CAD Carotid stenosis -does not appear he was on a statin prior to admission. Follow LFTs and start statin if they do not rise significantly--does have history of shock liver in the past  History of cocaine abuse -UDS pending -would avoid selective beta blockers  Overall this is a very high risk case, with a high risk of mortality if the LV clot especially were to embolize. There are no good options. Appreciate assistance of hospitalist and neurology teams. We discussed, and given the risk, will start heparin if MRI without high risk features/bleeding and monitor closely with neuro checks for changes in status. Even with anticoagulation, he still has a high risk of mortality  We will continue to follow. Very high complexity medical decision making, high risk of morbidity/mortality.  For questions or updates, please contact [facility] Please consult [email or web address] for contact info under   Signed, Jodelle Red, MD  03/04/2024 7:13 PM

## 2024-03-04 NOTE — Assessment & Plan Note (Deleted)
 Seizure precautions

## 2024-03-04 NOTE — Assessment & Plan Note (Signed)
 No s/sx of exacerbation Continue trelegy and SABA prn

## 2024-03-04 NOTE — Assessment & Plan Note (Addendum)
 Per chart review from 01/2024  Previously declined anticoagulation  Per chart review  "patient has declined the use of medications and continue engaging into further discussion to determine the need for chronic therapy"

## 2024-03-04 NOTE — Assessment & Plan Note (Signed)
 Permissive HTN in setting of acute CVA Treat if systolic >220/110

## 2024-03-04 NOTE — Assessment & Plan Note (Signed)
 Continue protonix

## 2024-03-04 NOTE — Assessment & Plan Note (Signed)
 Mildly elevated, shock liver in the past Liver enzymes normal in February  Continue to trend

## 2024-03-05 ENCOUNTER — Inpatient Hospital Stay (HOSPITAL_COMMUNITY): Payer: MEDICAID

## 2024-03-05 DIAGNOSIS — R748 Abnormal levels of other serum enzymes: Secondary | ICD-10-CM | POA: Diagnosis not present

## 2024-03-05 DIAGNOSIS — I5022 Chronic systolic (congestive) heart failure: Secondary | ICD-10-CM | POA: Diagnosis not present

## 2024-03-05 DIAGNOSIS — I69391 Dysphagia following cerebral infarction: Secondary | ICD-10-CM

## 2024-03-05 DIAGNOSIS — I63512 Cerebral infarction due to unspecified occlusion or stenosis of left middle cerebral artery: Secondary | ICD-10-CM | POA: Diagnosis not present

## 2024-03-05 DIAGNOSIS — I240 Acute coronary thrombosis not resulting in myocardial infarction: Secondary | ICD-10-CM | POA: Diagnosis not present

## 2024-03-05 DIAGNOSIS — I63412 Cerebral infarction due to embolism of left middle cerebral artery: Secondary | ICD-10-CM | POA: Diagnosis not present

## 2024-03-05 DIAGNOSIS — I513 Intracardiac thrombosis, not elsewhere classified: Secondary | ICD-10-CM | POA: Diagnosis not present

## 2024-03-05 DIAGNOSIS — Z7901 Long term (current) use of anticoagulants: Secondary | ICD-10-CM

## 2024-03-05 DIAGNOSIS — E11649 Type 2 diabetes mellitus with hypoglycemia without coma: Secondary | ICD-10-CM

## 2024-03-05 DIAGNOSIS — E785 Hyperlipidemia, unspecified: Secondary | ICD-10-CM

## 2024-03-05 DIAGNOSIS — I639 Cerebral infarction, unspecified: Secondary | ICD-10-CM | POA: Diagnosis not present

## 2024-03-05 DIAGNOSIS — I429 Cardiomyopathy, unspecified: Secondary | ICD-10-CM

## 2024-03-05 LAB — URINALYSIS, ROUTINE W REFLEX MICROSCOPIC
Bacteria, UA: NONE SEEN
Bilirubin Urine: NEGATIVE
Glucose, UA: NEGATIVE mg/dL
Hgb urine dipstick: NEGATIVE
Ketones, ur: 5 mg/dL — AB
Leukocytes,Ua: NEGATIVE
Nitrite: NEGATIVE
Protein, ur: 30 mg/dL — AB
Specific Gravity, Urine: >1.046 — ABNORMAL HIGH (ref 1.005–1.030)
pH: 5 (ref 5.0–8.0)

## 2024-03-05 LAB — LIPID PANEL
Cholesterol: 110 mg/dL (ref 0–200)
HDL: 29 mg/dL — ABNORMAL LOW (ref >40)
LDL Cholesterol: 71 mg/dL (ref 0–99)
Total CHOL/HDL Ratio: 3.8 ratio
Triglycerides: 51 mg/dL (ref <150)
VLDL: 10 mg/dL (ref 0–40)

## 2024-03-05 LAB — GLUCOSE, CAPILLARY
Glucose-Capillary: 57 mg/dL — ABNORMAL LOW (ref 70–99)
Glucose-Capillary: 125 mg/dL — ABNORMAL HIGH (ref 70–99)
Glucose-Capillary: 65 mg/dL — ABNORMAL LOW (ref 70–99)
Glucose-Capillary: 69 mg/dL — ABNORMAL LOW (ref 70–99)
Glucose-Capillary: 89 mg/dL (ref 70–99)

## 2024-03-05 LAB — CBC
HCT: 42.3 % (ref 39.0–52.0)
Hemoglobin: 13.9 g/dL (ref 13.0–17.0)
MCH: 30.7 pg (ref 26.0–34.0)
MCHC: 32.9 g/dL (ref 30.0–36.0)
MCV: 93.4 fL (ref 80.0–100.0)
Platelets: 229 10*3/uL (ref 150–400)
RBC: 4.53 MIL/uL (ref 4.22–5.81)
RDW: 16.5 % — ABNORMAL HIGH (ref 11.5–15.5)
WBC: 6.4 10*3/uL (ref 4.0–10.5)
nRBC: 0 % (ref 0.0–0.2)

## 2024-03-05 LAB — BASIC METABOLIC PANEL WITH GFR
Anion gap: 11 (ref 5–15)
BUN: 13 mg/dL (ref 6–20)
CO2: 19 mmol/L — ABNORMAL LOW (ref 22–32)
Calcium: 8.3 mg/dL — ABNORMAL LOW (ref 8.9–10.3)
Chloride: 112 mmol/L — ABNORMAL HIGH (ref 98–111)
Creatinine, Ser: 1.08 mg/dL (ref 0.61–1.24)
GFR, Estimated: >60 mL/min (ref >60)
Glucose, Bld: 74 mg/dL (ref 70–99)
Potassium: 3.7 mmol/L (ref 3.5–5.1)
Sodium: 142 mmol/L (ref 135–145)

## 2024-03-05 LAB — HEPARIN LEVEL (UNFRACTIONATED)
Heparin Unfractionated: <0.1 [IU]/mL — ABNORMAL LOW (ref 0.30–0.70)
Heparin Unfractionated: <0.1 [IU]/mL — ABNORMAL LOW (ref 0.30–0.70)

## 2024-03-05 LAB — ANTITHROMBIN III: AntiThromb III Func: 70 % — ABNORMAL LOW (ref 75–120)

## 2024-03-05 LAB — RAPID URINE DRUG SCREEN, HOSP PERFORMED
Amphetamines: NOT DETECTED
Barbiturates: NOT DETECTED
Benzodiazepines: POSITIVE — AB
Cocaine: POSITIVE — AB
Opiates: POSITIVE — AB
Tetrahydrocannabinol: NOT DETECTED

## 2024-03-05 LAB — MRSA NEXT GEN BY PCR, NASAL: MRSA by PCR Next Gen: NOT DETECTED

## 2024-03-05 MED ORDER — DEXTROSE IN LACTATED RINGERS 5 % IV SOLN: INTRAVENOUS | Status: DC

## 2024-03-05 MED ORDER — DEXTROSE 50 % IV SOLN: 12.5000 g | INTRAVENOUS | Status: AC

## 2024-03-05 MED ORDER — LORAZEPAM 2 MG/ML IJ SOLN
2.0000 mg | Freq: Once | INTRAMUSCULAR | Status: AC
  Administered 2024-03-05: 2 mg via INTRAVENOUS
  Filled 2024-03-05: qty 1

## 2024-03-05 MED ORDER — CARVEDILOL 3.125 MG PO TABS
3.1250 mg | ORAL_TABLET | Freq: Two times a day (BID) | ORAL | Status: DC
  Administered 2024-03-05 – 2024-03-07 (×5): 3.125 mg via ORAL
  Filled 2024-03-05 (×6): qty 1

## 2024-03-05 MED ORDER — DEXTROSE 50 % IV SOLN
12.5000 g | INTRAVENOUS | Status: AC
  Administered 2024-03-05: 12.5 g via INTRAVENOUS
  Filled 2024-03-05: qty 50

## 2024-03-05 MED ORDER — ROSUVASTATIN CALCIUM 5 MG PO TABS
5.0000 mg | ORAL_TABLET | Freq: Every day | ORAL | Status: DC
  Administered 2024-03-05 – 2024-08-09 (×117): 5 mg via ORAL
  Filled 2024-03-05 (×146): qty 1

## 2024-03-05 NOTE — Progress Notes (Signed)
 Patient refused for repeat blood sugar check after being treated for the hypoglycemic episode of 65. Patient continues to demand to eat and drink. Patient keeps calling out on the call bell. Multiple staff members have told him we cannot give him any food or drink at this time. RN educated patient on the possibility of enteral tube placement if he does not pass this second round of swallowing test with Speech. Patient adamantly refuses that. He is more alert now and talkative.

## 2024-03-05 NOTE — Evaluation (Signed)
 Physical Therapy Evaluation Patient Details Name: [name] MRN: [id number] DOB: [birth year] Today's Date: 03/05/2024  History of Present Illness  Patient is 58 y.o. male presented to ED on 03/04/24 with stroke like symptoms when caregiver found him with slurred speech and right sided weakness. Head CT reveals Acute left MCA territory infarct affecting the insula, caudate nucleus, lentiform nucleus and internal capsule. PMH significant of  Schizoaffective disorder, heart failure with reduced ejection fraction, pretension, DMII, COPD, s/p bilateral amputations, chronic pain, and polysubstance abuse.   Clinical Impression  [name] is 58 y.o. male admitted with above HPI and diagnosis. Patient is currently limited by functional impairments below (see PT problem list). Per chart review pt has been living alone in apartment and had been participating in HHPT prior to admission in February of 2025 when he was mod ind with wheelchair transfers and mobility. Currently he is greatly limited by Rt hemiplegia and aphasia. Pt required up to max assist for bed mobility and despite education and encouragement pt declined to mobilize to EOB. Pt perseverating on wanting to eat foot today. Patient will benefit from continued skilled PT interventions to address impairments and progress independence with mobility. Patient will benefit from continued inpatient follow up therapy, <3 hours/day. Acute PT will follow and progress as able.         If plan is discharge home, recommend the following: Two people to help with walking and/or transfers;Two people to help with bathing/dressing/bathroom;Assistance with cooking/housework;Assistance with feeding;Direct supervision/assist for medications management;Assist for transportation;Help with stairs or ramp for entrance;Supervision due to cognitive status   Can travel by private vehicle   No    Equipment Recommendations Wheelchair cushion (measurements  PT);Wheelchair (measurements PT);Hoyer lift;Hospital bed (TBD)  Recommendations for Other Services       Functional Status Assessment Patient has had a recent decline in their functional status and/or demonstrates limited ability to make significant improvements in function in a reasonable and predictable amount of time     Precautions / Restrictions Precautions Precautions: Fall Recall of Precautions/Restrictions: Impaired Restrictions Weight Bearing Restrictions Per Provider Order: No      Mobility  Bed Mobility Overal bed mobility: Needs Assistance Bed Mobility: Rolling Rolling: Min assist, Max assist         General bed mobility comments: Pt able to reach Lt UE to bed rail for roll Rt however min-mod assist to encourage for bed pad repositioning. Max assidt required to roll Lt and no activation noted on Rt side. Pt required +2 for superior boost in bed. Pt declining supine>sit despite encouragement. Pt perseverating on wanting food agitated theraist unable to provide. pt NPO at time of session.    Transfers                        Ambulation/Gait                  Stairs            Wheelchair Mobility     Tilt Bed    Modified Rankin (Stroke Patients Only)       Balance                                             Pertinent Vitals/Pain Pain Assessment Pain Assessment: Faces Faces Pain Scale: No hurt    Home Living Family/patient expects  to be discharged to:: Private residence Living Arrangements: Alone   Type of Home: Apartment             Additional Comments: Information regarding home living and PLOF obtained through chart review as pt agitated about not being allowed to eat and not following conversation to answer questions fully.    Prior Function Prior Level of Function : Needs assist;Independent/Modified Independent             Mobility Comments: Per chart review, pt often requires assistance for  functional mobility from w/c level due to difficulty with self-propulsion following prior CVA. Per chart review, pt has L LE prosthesis and does not have prosthesis on the right.. ADLs Comments: Per chart review, pt completes ADLs Independent to Mod I from w/c level.     Extremity/Trunk Assessment   Upper Extremity Assessment Upper Extremity Assessment: Defer to OT evaluation    Lower Extremity Assessment Lower Extremity Assessment: RLE deficits/detail;LLE deficits/detail RLE Deficits / Details: no activation or volitional movement noted in Rt LE, Rt residual limb intact LLE Deficits / Details: pt moving to 50% of cues, unsure if choosing not to follow commands vs unable to process. Lt residual limb intact.       Communication   Communication Communication: Impaired Factors Affecting Communication: Difficulty expressing self;Reduced clarity of speech    Cognition Arousal: Alert Behavior During Therapy: WFL for tasks assessed/performed                             Following commands: Intact       Cueing Cueing Techniques: Verbal cues     General Comments      Exercises     Assessment/Plan    PT Assessment Patient needs continued PT services  PT Problem List Decreased strength;Decreased range of motion;Decreased activity tolerance;Decreased balance;Decreased mobility;Decreased cognition;Decreased knowledge of use of DME;Decreased coordination;Decreased safety awareness;Cardiopulmonary status limiting activity;Decreased knowledge of precautions;Impaired sensation       PT Treatment Interventions DME instruction;Gait training;Stair training;Functional mobility training;Therapeutic activities;Therapeutic exercise;Balance training;Neuromuscular re-education;Cognitive remediation;Patient/family education;Wheelchair mobility training;Manual techniques    PT Goals (Current goals can be found in the Care Plan section)  Acute Rehab PT Goals Patient Stated Goal: to  eat food (perseverating) PT Goal Formulation: Patient unable to participate in goal setting Time For Goal Achievement: 03/19/24 Potential to Achieve Goals: Fair    Frequency Min 2X/week     Co-evaluation               AM-PAC PT "6 Clicks" Mobility  Outcome Measure Help needed turning from your back to your side while in a flat bed without using bedrails?: Total Help needed moving from lying on your back to sitting on the side of a flat bed without using bedrails?: Total Help needed moving to and from a bed to a chair (including a wheelchair)?: Total Help needed standing up from a chair using your arms (e.g., wheelchair or bedside chair)?: Total Help needed to walk in hospital room?: Total Help needed climbing 3-5 steps with a railing? : Total 6 Click Score: 6    End of Session   Activity Tolerance: Treatment limited secondary to agitation Patient left: in bed;with call bell/phone within reach;with bed alarm set;Other (comment) (SLP at bedside) Nurse Communication: Mobility status PT Visit Diagnosis: Other abnormalities of gait and mobility (R26.89);Muscle weakness (generalized) (M62.81);Other symptoms and signs involving the nervous system (R29.898);Hemiplegia and hemiparesis Hemiplegia - Right/Left: Right Hemiplegia -  caused by: Cerebral infarction    Time: 1400-1417 PT Time Calculation (min) (ACUTE ONLY): 17 min   Charges:   PT Evaluation $PT Eval Moderate Complexity: 1 Mod   PT General Charges $$ ACUTE PT VISIT: 1 Visit         Wynn Maudlin, DPT Acute Rehabilitation Services Office [phone]  03/05/24 3:30 PM

## 2024-03-05 NOTE — Progress Notes (Signed)
 PHARMACY - ANTICOAGULATION CONSULT NOTE  Pharmacy Consult for Heparin Indication: LV thrombus  Allergies  Allergen Reactions   Acetaminophen Hives, Nausea And Vomiting, Rash and Anaphylaxis    PT states that if he takes this he is going to the hospital   Motrin [Ibuprofen] Anaphylaxis   Nsaids Anaphylaxis    PT states that if he takes this he is going to the hospital   Lyrica Cr [Pregabalin Er] Other (See Comments)    Dystonia Dysarthria    Peanut-Containing Drug Products Other (See Comments)    Pt denies allergy to peanut butter 03/02/2021    Patient Measurements: Height: 4\' 2"  (127 cm) Weight: 77 kg (169 lb 12.1 oz) IBW/kg (Calculated) : 27 HEPARIN DW (KG): 46.7  Vital Signs: Temp: 98.1 F (36.7 C) (04/05 2000) Temp Source: Oral (04/05 2000) BP: 150/109 (04/05 1700) Pulse Rate: 101 (04/05 1500)  Labs: Recent Labs    03/04/24 1436 03/05/24 0609 03/05/24 0700 03/05/24 2006  HGB 14.3  15.0 13.9  --   --   HCT 43.8  44.0 42.3  --   --   PLT 210 229  --   --   APTT 30  --   --   --   LABPROT 18.1*  --   --   --   INR 1.5*  --   --   --   HEPARINUNFRC  --   --  <0.10* <0.10*  CREATININE 1.04  1.00 1.08  --   --     Estimated Creatinine Clearance: 51.4 mL/min (by C-G formula based on SCr of 1.08 mg/dL).  Assessment: 58 yo male presents with acute left MCA CVA. 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 CBC wnl. Not on [facility] prior to admission. Heparin level undetectable, per confirmation with RN, heparin charted as off for small period of time overnight but no significant issues with heparin infusion reported during handoff. Will increase rate conservatively and recheck level in 6 hours.  PM f/u > delay in heparin level due to pt refusal of labs, then difficulty with lab sticks.  Heparin level remains undetectable this evening.  No known issues with IV infusion, no overt bleeding or complications noted.  Goal of Therapy:  Heparin level  0.3-0.5 units/ml Monitor platelets by anticoagulation protocol: Yes   Plan:   No heparin bolus Increase heparin infusion to 1000 units/hr Check heparin level in 6 hours and daily while on heparin Continue to monitor H&H and platelets  Thank you for allowing pharmacy to be a part of this patient's care.  Reece Leader, Colon Flattery, BCCP Clinical Pharmacist  03/05/2024 8:38 PM   [facility] pharmacy phone numbers are listed on [email or web address]

## 2024-03-05 NOTE — Progress Notes (Signed)
 RN attempted to call for patient's contacts listed in EPIC. Called a [name] on the numbers listed in Epic. No answer, VM was full and was not able to leave a message.

## 2024-03-05 NOTE — Evaluation (Signed)
 Speech Language Pathology Evaluation Patient Details Name: [name] MRN: [id number] DOB: 02-18-1966 Today's Date: 03/05/2024 Time: 4098-1191 SLP Time Calculation (min) (ACUTE ONLY): 8 min  Problem List:  Patient Active Problem List   Diagnosis Date Noted   Acute CVA (cerebrovascular accident) (HCC) 03/04/2024   Chronic systolic CHF (congestive heart failure) (HCC) 03/04/2024   Benign essential HTN 03/04/2024   Acute thrombus of left ventricle (HCC) 03/04/2024   Elevated liver enzymes 03/04/2024   Dilated cardiomyopathy (HCC) 03/04/2024   Heart failure with reduced ejection fraction (HCC) 01/02/2024   Acute on chronic HFrEF (heart failure with reduced ejection fraction) (HCC) 12/20/2023   Cocaine use 10/23/2023   COPD (chronic obstructive pulmonary disease) (HCC) 11/25/2022   Pulmonary emboli (HCC) 08/17/2022   Venous ulcer (HCC) 07/23/2022   Sinus bradycardia 07/23/2022   Anemia of chronic disease 07/23/2022   Status post bilateral BKA. 07/23/2022   Acute on chronic systolic congestive heart failure (HCC) 07/12/2022   History of DVT (deep vein thrombosis) 07/12/2022   CAP (community acquired pneumonia) 07/12/2022   Chronic venous hypertension (idiopathic) with ulcer and inflammation of bilateral lower extremity (HCC)    Normocytic anemia 04/04/2022   Acute deep vein thrombosis (DVT) of brachial vein (HCC) 03/31/2022   Swelling of lower extremity 03/23/2022   SIRS (systemic inflammatory response syndrome) (HCC) 03/23/2022   Leg swelling 03/23/2022   Acute on chronic systolic heart failure (HCC) 02/26/2022   Venous stasis ulcer of right ankle limited to breakdown of skin with varicose veins (HCC)    Hypertensive urgency 01/29/2022   Prolonged QT interval 01/29/2022   Alcohol abuse 01/29/2022   chronic ulcers 01/29/2022   Wound infection 12/04/2021   Mixed diabetic hyperlipidemia associated with type 2 diabetes mellitus (HCC) 12/03/2021   Cellulitis of right lower extremity  11/13/2021   Type 2 diabetes mellitus (HCC) 11/13/2021   Antisocial personality disorder (HCC) 11/04/2021   Malingering 11/04/2021   Foot pain 04/23/2021   Chronic combined systolic and diastolic congestive heart failure (HCC) 03/02/2021   History of stroke 03/02/2021   Polysubstance abuse (HCC) 03/02/2021   Skin ulcer of right great toe with necrosis of bone (HCC)    Encounter for assessment of healthcare decision-making capacity    Pain, dental    Dystonia    Dysarthria    Shock liver 01/04/2021   Acute metabolic encephalopathy 01/04/2021   Coronary artery disease involving native coronary artery of native heart without angina pectoris 01/04/2021   Essential hypertension 01/04/2021   Bipolar I disorder (HCC) 01/04/2021   Cocaine abuse (HCC) 01/04/2021   Homelessness 01/04/2021   HLD (hyperlipidemia) 01/04/2021   Osteomyelitis of great toe (HCC)    Ulcer of great toe, left, with necrosis of bone (HCC)    Ulcer of great toe, right, with necrosis of muscle (HCC)    Acute osteomyelitis of toe, left (HCC)    Acute on chronic combined systolic and diastolic CHF (congestive heart failure) (HCC) 12/20/2020   Acute respiratory failure with hypoxia (HCC)    Ischemic stroke (HCC) 12/17/2020   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   Diabetic foot ulcer (HCC) 12/08/2020   CHF (congestive heart failure) (HCC) 11/14/2020   CHF exacerbation (HCC) 11/13/2020   Hypokalemia    Hyperlipidemia    Acute hypoxemic respiratory failure (HCC) 10/26/2020   COPD with acute exacerbation (HCC) 10/14/2020   Acute on chronic combined systolic and diastolic CHF (congestive heart failure) (HCC) 10/02/2020   Heart failure with reduced ejection fraction and diastolic dysfunction (HCC)  09/07/2020   Near syncope 07/21/2020   Acid reflux 06/03/2020   Cannabis abuse 03/19/2020   Schizoaffective disorder (HCC) 06/24/2019   SOB (shortness of breath)    Cocaine abuse (HCC)    Elevated troponin    Homeless     Suicidal ideation    Bilateral lower extremity pain 04/05/2019   Housing situation unstable 03/28/2019   Bilateral lower extremity edema    Acute exacerbation of CHF (congestive heart failure) (HCC) 03/04/2019   Alcohol withdrawal syndrome, with delirium (HCC) 05/03/2018   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   Mild intermittent asthma without complication 08/28/2017   Asthma 12/26/2016   Severe episode of recurrent major depressive disorder, with psychotic features (HCC) 10/31/2016   Cocaine abuse with cocaine-induced mood disorder (HCC) 09/06/2016   Alcohol use disorder, mild, abuse 07/31/2016   Substance induced mood disorder (HCC) 07/30/2016   Schizoaffective disorder, bipolar type (HCC) 05/31/2016   Cocaine use disorder, severe, dependence (HCC) 05/31/2016   Major depressive disorder, recurrent episode (HCC) 05/30/2016   Suicidal behavior with attempted self-injury (HCC) 05/08/2016   Acetaminophen overdose 05/06/2016   Past Medical History:  Past Medical History:  Diagnosis Date   Asthma    Bipolar 1 disorder (HCC)    CHF (congestive heart failure) (HCC)    COPD (chronic obstructive pulmonary disease) (HCC)    Depression    Diabetes mellitus without complication (HCC)    GERD (gastroesophageal reflux disease)    Homelessness    Hypertension    QT prolongation    Schizoaffective disorder (HCC)    Past Surgical History:  Past Surgical History:  Procedure Laterality Date   AMPUTATION Left 12/10/2022   Procedure: LEFT BELOW KNEE AMPUTATION;  Surgeon: Nadara Mustard, MD;  Location: [facility] OR;  Service: Orthopedics;  Laterality: Left;   AMPUTATION TOE Bilateral 01/02/2021   Procedure: AMPUTATION TOE;  Surgeon: Park Liter, DPM;  Location: [facility] OR;  Service: Podiatry;  Laterality: Bilateral;  Ok to leave patient on hospital bed   IR ANGIO INTRA EXTRACRAN SEL COM CAROTID INNOMINATE BILAT MOD SED  12/17/2020   IR ANGIO VERTEBRAL SEL VERTEBRAL BILAT MOD  SED  12/17/2020   MULTIPLE EXTRACTIONS WITH ALVEOLOPLASTY N/A 03/25/2023   Procedure: IRRIGATION AND DEBRIDEMENT, MULTIPLE EXTRACTION WITH ALVEOLOPLASTY X 4 , EXTRACTIONS 4 THROUGH 13, 20 THROUGHT 29;  Surgeon: Vivia Ewing, DMD;  Location: [facility] OR;  Service: Oral Surgery;  Laterality: N/A;   RADIOLOGY WITH ANESTHESIA N/A 12/17/2020   Procedure: IR WITH ANESTHESIA;  Surgeon: Radiologist, Medication, MD;  Location: [facility] OR;  Service: Radiology;  Laterality: N/A;   HPI:  Pt is a 58 y.o. male who presented to ED with stroke like symptoms. Caregiver found him with slurred speech and right sided weakness. MRI (03/04/24) revealed "Acute infarct of the left basal ganglia and insula with punctate foci that extend to the subcortical white matter of the left frontal and parietal lobes. Multiple old cerebellar infarcts and findings of chronic small vessel ischemia". Previous BSE (08/21/22) revealed no s/sx of aspiration or dysphagia with regular diet/thin liquids initiated. 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 PMH: cocaine abuse, schizoaffective d/o, depression, T2DM, HTN, GERD, systolic CHF, COPD, s/p b/l lower extremity amputee, chronic pain.   Assessment / Plan / Recommendation Clinical Impression  Pt with suspected cognitive communication deficits and dysarthria post acute CVA. Pt with recent dose of Ativan and was lethargic througout eval, though awakened with minimal cueing. Speech is dysarthric and noted  to be less than 50% intelligible at the single word level. He quickly engaged in conversation with clinician and demonstrated no awareness of speech deficits. Coordination of breathing for speech was reduced, resulting in low vocal intensity. When cued to produce name with increased vocal intensity and over articulation, intelligibility improved. 3/4 items were named correctly during confrontational naming task. Majority of simple 1 step commands followed, though this was not always consistent,  likely due to poor attention and fatigue. Evaluation of cognitive-linguistic abilities was limited today due to overall lethargy and SLP plans to return to assess further.    SLP Assessment  SLP Recommendation/Assessment: Patient needs continued Speech Lanaguage Pathology Services SLP Visit Diagnosis: Dysarthria and anarthria (R47.1);Cognitive communication deficit (R41.841)    Recommendations for follow up therapy are one component of a multi-disciplinary discharge planning process, led by the attending physician.  Recommendations may be updated based on patient status, additional functional criteria and insurance authorization.    Follow Up Recommendations   (TBD)    Assistance Recommended at Discharge     Functional Status Assessment Patient has had a recent decline in their functional status and demonstrates the ability to make significant improvements in function in a reasonable and predictable amount of time.  Frequency and Duration min 2x/week  2 weeks      SLP Evaluation Cognition  Overall Cognitive Status: No family/caregiver present to determine baseline cognitive functioning Arousal/Alertness: Lethargic Orientation Level: Oriented to person Attention: Sustained Sustained Attention: Impaired Sustained Attention Impairment: Verbal basic       Comprehension  Auditory Comprehension Overall Auditory Comprehension: Impaired Commands: Impaired One Step Basic Commands: 75-100% accurate Conversation: Simple Interfering Components: Attention Visual Recognition/Discrimination Discrimination: Not tested Reading Comprehension Reading Status: Not tested    Expression Expression Primary Mode of Expression: Verbal Verbal Expression Overall Verbal Expression: Impaired Initiation: No impairment Automatic Speech: Name;Social Response Level of Generative/Spontaneous Verbalization: Sentence Naming: Impairment Confrontation: Impaired Interfering Components: Speech  intelligibility;Attention (lethargy) Written Expression Dominant Hand: Right   Oral / Motor  Oral Motor/Sensory Function Overall Oral Motor/Sensory Function: Moderate impairment Facial ROM: Reduced right;Suspected CN VII (facial) dysfunction Facial Symmetry: Abnormal symmetry right;Suspected CN VII (facial) dysfunction Facial Strength: Reduced right;Suspected CN VII (facial) dysfunction Lingual ROM: Reduced right;Suspected CN XII (hypoglossal) dysfunction Mandible: Within Functional Limits Motor Speech Overall Motor Speech: Impaired Respiration: Impaired Level of Impairment: Phrase Phonation: Normal Resonance: Within functional limits Articulation: Impaired Level of Impairment: Phrase Intelligibility: Intelligibility reduced Word: 0-24% accurate Phrase: 0-24% accurate Sentence: 0-24% accurate Motor Speech Errors: Unaware Effective Techniques: Slow rate;Increased vocal intensity;Over-articulate             Avie Echevaria, MA, CCC-SLP Acute Rehabilitation Services Office Number: [phone]  Paulette Blanch 03/05/2024, 11:52 AM

## 2024-03-05 NOTE — Progress Notes (Signed)
 eLink Physician-Brief Progress Note Patient Name: [name] DOB: July 04, 1966 MRN: [id number]   Date of Service  03/05/2024  HPI/Events of Note  Patient on IV heparin for intracardiac thrombus and CVA.  He is refusing lab draws.  He is restless and aphasic.  Nurses unable to care for him  eICU Interventions  Ativan 2 mg IV x 1     Intervention Category Intermediate Interventions: Other:  Henry Russel, Demetrius Charity 03/05/2024, 5:33 AM

## 2024-03-05 NOTE — Progress Notes (Signed)
 Speech Language Pathology Treatment: Dysphagia  Patient Details Name: [name] MRN: [id number] DOB: 1965-12-12 Today's Date: 03/05/2024 Time: 7829-5621 SLP Time Calculation (min) (ACUTE ONLY): 24 min  Assessment / Plan / Recommendation Clinical Impression  RN messaged this SLP with reports of pt with increased alertness and adamantly requesting POs. Pt alert and sitting upright in bed with PT assist upon SLP arrival. Initial ice chip x1 significant for immediate overt cough, without s/sx on subsequent ice chip trials. 3oz water challenge completed without signs of aspiration to follow, though pt with increased RR (low 30s) and increased WOB. He self-fed very large bites of puree and graham cracker rapidly despite clinician cues for slow rate/small bites and sips. Oral cavity clear of all solid POs post intake. He consumed 1/2 styrofoam cup of water via straw in consecutive sips with x1 strong delayed coughing to follow. Recommend continue NPO with ice chips OK after oral care (full supervision from staff). He may have critical meds crushed in applesauce. Given clinical presentation during this treatment session, this SLP is concerned for aspiration, especially in the clinical setting of acute CVA and pt impulsivity. Recommend completion of Modified Barium Swallow Study as soon as radiology schedule allows. Discussed recommendations, including instrumental assessment with pt. He was very irritable and did not want to try any modified diet/thickened liquid and was hesitant to agree upon completion of testing despite education and encouragement. Will f/u for PO trials and ongoing education regarding importance of instrumental assessment to help inform SLP POC, minimize aspiration risk and associated health complications.    HPI HPI: Pt is a 58 y.o. male who presented to ED with stroke like symptoms. Caregiver found him with slurred speech and right sided weakness. MRI (03/04/24) revealed "Acute infarct of  the left basal ganglia and insula with punctate foci that extend to the subcortical white matter of the left frontal and parietal lobes. Multiple old cerebellar infarcts and findings of chronic small vessel ischemia". Previous BSE (08/21/22) revealed no s/sx of aspiration or dysphagia with regular diet/thin liquids initiated. 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 PMH: cocaine abuse, schizoaffective d/o, depression, T2DM, HTN, GERD, systolic CHF, COPD, s/p b/l lower extremity amputee, chronic pain.      SLP Plan  Continue with current plan of care;MBS  Patient needs continued Speech Lanaguage Pathology Services   Recommendations for follow up therapy are one component of a multi-disciplinary discharge planning process, led by the attending physician.  Recommendations may be updated based on patient status, additional functional criteria and insurance authorization.    Recommendations  Diet recommendations: NPO (ice chips after oral care, critical meds crushed in puree) Liquids provided via: Teaspoon Medication Administration: Crushed with puree Supervision: Full supervision/cueing for compensatory strategies Compensations: Minimize environmental distractions;Slow rate;Small sips/bites Postural Changes and/or Swallow Maneuvers: Seated upright 90 degrees                  Oral care QID   Frequent or constant Supervision/Assistance Dysphagia, unspecified (R13.10)     Continue with current plan of care;MBS      Avie Echevaria, MA, CCC-SLP Acute Rehabilitation Services Office Number: [phone]  Paulette Blanch  03/05/2024, 3:01 PM

## 2024-03-05 NOTE — Evaluation (Signed)
 Clinical/Bedside Swallow Evaluation Patient Details  Name: [name] MRN: [id number] Date of Birth: 09-15-1966  Today's Date: 03/05/2024 Time: SLP Start Time (ACUTE ONLY): 0948 SLP Stop Time (ACUTE ONLY): 0957 SLP Time Calculation (min) (ACUTE ONLY): 9 min  Past Medical History:  Past Medical History:  Diagnosis Date   Asthma    Bipolar 1 disorder (HCC)    CHF (congestive heart failure) (HCC)    COPD (chronic obstructive pulmonary disease) (HCC)    Depression    Diabetes mellitus without complication (HCC)    GERD (gastroesophageal reflux disease)    Homelessness    Hypertension    QT prolongation    Schizoaffective disorder (HCC)    Past Surgical History:  Past Surgical History:  Procedure Laterality Date   AMPUTATION Left 12/10/2022   Procedure: LEFT BELOW KNEE AMPUTATION;  Surgeon: Nadara Mustard, MD;  Location: [facility] OR;  Service: Orthopedics;  Laterality: Left;   AMPUTATION TOE Bilateral 01/02/2021   Procedure: AMPUTATION TOE;  Surgeon: Park Liter, DPM;  Location: [facility] OR;  Service: Podiatry;  Laterality: Bilateral;  Ok to leave patient on hospital bed   IR ANGIO INTRA EXTRACRAN SEL COM CAROTID INNOMINATE BILAT MOD SED  12/17/2020   IR ANGIO VERTEBRAL SEL VERTEBRAL BILAT MOD SED  12/17/2020   MULTIPLE EXTRACTIONS WITH ALVEOLOPLASTY N/A 03/25/2023   Procedure: IRRIGATION AND DEBRIDEMENT, MULTIPLE EXTRACTION WITH ALVEOLOPLASTY X 4 , EXTRACTIONS 4 THROUGH 13, 20 THROUGHT 29;  Surgeon: Vivia Ewing, DMD;  Location: [facility] OR;  Service: Oral Surgery;  Laterality: N/A;   RADIOLOGY WITH ANESTHESIA N/A 12/17/2020   Procedure: IR WITH ANESTHESIA;  Surgeon: Radiologist, Medication, MD;  Location: [facility] OR;  Service: Radiology;  Laterality: N/A;   HPI:  Pt is a 58 y.o. male who presented to ED with stroke like symptoms. Caregiver found him with slurred speech and right sided weakness. MRI (03/04/24) revealed "Acute infarct of the left basal ganglia and insula with punctate foci that extend to the  subcortical white matter of the left frontal and parietal lobes. Multiple old cerebellar infarcts and findings of chronic small vessel ischemia". Previous BSE (08/21/22) revealed no s/sx of aspiration or dysphagia with regular diet/thin liquids initiated. 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 PMH: cocaine abuse, schizoaffective d/o, depression, T2DM, HTN, GERD, systolic CHF, COPD, s/p b/l lower extremity amputee, chronic pain.    Assessment / Plan / Recommendation  Clinical Impression  Pt given dose of Ativan around 5 am today and lethargic upon SLP arrival. He did arouse with minimal verbal cues and was very vocal about his desire to eat and drink. Oral mechanism examination significant for R facial asymmetry as well as reduced R facial strength. Lingual ROM also reduced on the R side. While pt eager to consume POs, note that he required intermittent verbal/tactile stimulation cues to remain alert. He actively manipulated ice chips x3 without s/sx of aspiration, although WOB increased and coordination of breathing/swallowing appeared difficult. Despite encouragement to start with small sips, pt consumed 3oz of thin liquid via straw without stopping, though with immediate and strong overt coughing to follow. Small sips by straw were without symptoms, but with cough following 3oz water challenge, there is concern for aspiration. x2 tsp of puree significant for slow manipulation and AP transit. No oral residuals noted following palpable swallow. Given recent CVA, difficulty remaining alert (likely related to recent Ativan dosage) and increased WOB at times with POs, recommend continue NPO for now with SLP to f/u  on subsequent date for PO readiness. Suspect pt may demonstrate some improvement with PO intake when adequately and consistently alert.  SLP Visit Diagnosis: Dysphagia, unspecified (R13.10)    Aspiration Risk  Moderate aspiration risk    Diet Recommendation NPO    Medication  Administration: Via alternative means    Other  Recommendations Oral Care Recommendations: Oral care QID    Recommendations for follow up therapy are one component of a multi-disciplinary discharge planning process, led by the attending physician.  Recommendations may be updated based on patient status, additional functional criteria and insurance authorization.  Follow up Recommendations  (TBD)      Assistance Recommended at Discharge    Functional Status Assessment Patient has had a recent decline in their functional status and demonstrates the ability to make significant improvements in function in a reasonable and predictable amount of time.  Frequency and Duration min 2x/week  2 weeks       Prognosis Prognosis for improved oropharyngeal function: Fair Barriers to Reach Goals: Time post onset;Severity of deficits;Behavior      Swallow Study   General Date of Onset: 03/04/24 HPI: Pt is a 58 y.o. male who presented to ED with stroke like symptoms. Caregiver found him with slurred speech and right sided weakness. MRI (03/04/24) revealed "Acute infarct of the left basal ganglia and insula with punctate foci that extend to the subcortical white matter of the left frontal and parietal lobes. Multiple old cerebellar infarcts and findings of chronic small vessel ischemia". Previous BSE (08/21/22) revealed no s/sx of aspiration or dysphagia with regular diet/thin liquids initiated. 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 PMH: cocaine abuse, schizoaffective d/o, depression, T2DM, HTN, GERD, systolic CHF, COPD, s/p b/l lower extremity amputee, chronic pain. Type of Study: Bedside Swallow Evaluation Previous Swallow Assessment: see HPI Diet Prior to this Study: NPO Temperature Spikes Noted: No Respiratory Status: Room air History of Recent Intubation: No Behavior/Cognition: Lethargic/Drowsy;Requires cueing Oral Cavity Assessment: Within Functional Limits Oral Care Completed by  SLP: No Oral Cavity - Dentition: Edentulous Vision:  (difficult to assess) Self-Feeding Abilities: Total assist Patient Positioning: Upright in bed;Postural control adequate for testing Baseline Vocal Quality: Normal Volitional Cough: Weak    Oral/Motor/Sensory Function Overall Oral Motor/Sensory Function: Moderate impairment Facial ROM: Reduced right;Suspected CN VII (facial) dysfunction Facial Symmetry: Abnormal symmetry right;Suspected CN VII (facial) dysfunction Facial Strength: Reduced right;Suspected CN VII (facial) dysfunction Lingual ROM: Reduced right;Suspected CN XII (hypoglossal) dysfunction Mandible: Within Functional Limits   Ice Chips Ice chips: Impaired Presentation: Spoon Oral Phase Functional Implications: Oral holding Pharyngeal Phase Impairments: Change in Vital Signs   Thin Liquid Thin Liquid: Impaired Presentation: Straw Pharyngeal  Phase Impairments: Cough - Immediate    Nectar Thick Nectar Thick Liquid: Not tested   Honey Thick Honey Thick Liquid: Not tested   Puree Puree: Impaired Presentation: Spoon Oral Phase Impairments: Reduced lingual movement/coordination;Impaired mastication Oral Phase Functional Implications: Prolonged oral transit   Solid     Solid: Not tested       Avie Echevaria, MA, CCC-SLP Acute Rehabilitation Services Office Number: [phone]  Paulette Blanch 03/05/2024,10:50 AM

## 2024-03-05 NOTE — Progress Notes (Signed)
 PHARMACY - ANTICOAGULATION CONSULT NOTE  Pharmacy Consult for Heparin Indication: LV thrombus  Allergies  Allergen Reactions   Acetaminophen Hives, Nausea And Vomiting, Rash and Anaphylaxis    PT states that if he takes this he is going to the hospital   Motrin [Ibuprofen] Anaphylaxis   Nsaids Anaphylaxis    PT states that if he takes this he is going to the hospital   Lyrica Cr [Pregabalin Er] Other (See Comments)    Dystonia Dysarthria    Peanut-Containing Drug Products Other (See Comments)    Pt denies allergy to peanut butter 03/02/2021    Patient Measurements: Height: 4\' 2"  (127 cm) Weight: 77 kg (169 lb 12.1 oz) IBW/kg (Calculated) : 27 HEPARIN DW (KG): 46.7  Vital Signs: Temp: 98.2 F (36.8 C) (04/05 0812) Temp Source: Axillary (04/05 0400) BP: 157/108 (04/05 0700) Pulse Rate: 91 (04/05 0700)  Labs: Recent Labs    03/04/24 1436 03/05/24 0609 03/05/24 0700  HGB 14.3  15.0 13.9  --   HCT 43.8  44.0 42.3  --   PLT 210 229  --   APTT 30  --   --   LABPROT 18.1*  --   --   INR 1.5*  --   --   HEPARINUNFRC  --   --  <0.10*  CREATININE 1.04  1.00 1.08  --     Estimated Creatinine Clearance: 51.4 mL/min (by C-G formula based on SCr of 1.08 mg/dL).  Assessment: 58 yo male presents with acute left MCA CVA. 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 CBC wnl. Not on [facility] prior to admission. Heparin level undetectable, per confirmation with RN, heparin charted as off for small period of time overnight but no significant issues with heparin infusion reported during handoff. Will increase rate conservatively and recheck level in 6 hours.  Goal of Therapy:  Heparin level 0.3-0.5 units/ml Monitor platelets by anticoagulation protocol: Yes   Plan:  No heparin bolus Increase heparin infusion to 850 units/hr Check heparin level in 6 hours and daily while on heparin Continue to monitor H&H and platelets  Thank you for allowing pharmacy to  be a part of this patient's care.  Thelma Barge, PharmD, BCPS Clinical Pharmacist

## 2024-03-05 NOTE — Progress Notes (Signed)
 NAME:  [name], MRN:  [id number], DOB:  09/27/1966, LOS: 1 ADMISSION DATE:  03/04/2024, CONSULTATION DATE:  03/05/2024  REFERRING MD:  Orland Mustard , CHIEF COMPLAINT: Altered mental status  History of Present Illness:  58 year old man with cardiomyopathy and active cocaine use brought in as a code stroke by EMS. LKN around 12 PM.  His caregiver Inetta Fermo noted slurred speech and confusion.  Right arm and right leg weakness was noted by EMS neurology evaluation showed global aphasia and dysarthria with right facial droop, NIHSS was 20 Head CT showed acute left MCA infarct CTA head and neck showed left M2 occlusion Echo showed large mobile LV thrombus, LA thrombus and RV apical thrombus, EF was less than 20%.  Neurology and cardiology consulting and due to high risk of further emboli, decision made to anticoagulate and admit to ICU.  LV thrombus is more than 3 cm in size  He has a history of repeated admissions for heart failure and COPD exacerbation at least 3 this year. He has a history of active cocaine use  Pertinent  Medical History  Asthma, COPD,  GERD,  HTN , prolonged QT, Depression,  Schizoaffective d/o,  HFref, ,E <20%   DMII, s/p b/l lower extremity amputee,  chronic pain and polysubstance abuse - active cocaine use.   Significant Hospital Events: Including procedures, antibiotic start and stop dates in addition to other pertinent events   MRI brain 4/4>>acute left basal ganglia and insula infarct. Multiple old infarcts  Interim History / Subjective:  Admitted yesterday for acute left basal ganglia infarct. Found to have RV/LA/LV thrombus and is now on IV heparin. hypoglycemic  Objective   Blood pressure (!) 116/90, pulse (!) 101, temperature 98.2 F (36.8 C), temperature source Oral, resp. rate (!) 25, height 4\' 2"  (1.27 m), weight 77 kg, SpO2 98%.        Intake/Output Summary (Last 24 hours) at 03/05/2024 1536 Last data filed at 03/05/2024 1500 Gross per 24 hour  Intake  219.55 ml  Output 200 ml  Net 19.55 ml   Filed Weights   03/04/24 1550  Weight: 77 kg    Examination: Well developed, well nourished, laying flat no respiratory distress Dysarthric, Right sided hemiplegia RRR Breathing non labored on room air Bilateral lower extremity amputation Alert, oreinted to self, follows commands, argumentative  Na 142 K 3.7 CO2 19 Cr 1.08 Mag 1.3 WBC 6.4 Hgb 13.9    Resolved Hospital Problem list     Assessment & Plan:  Acute left MCA CVA Likely embolic, outside window for TNK or thrombectomy Failed swallow eval x2 today Will need NG tube, currently refusing. Will start D5 infuison to prevent further hypoglycemia  RV/LA/LV thrombus  -high risk embolising, IV heparin benefit outweighs risk of hemorrhagic transformation Hypercoag workup in process  Hypertension -allow permissive hypertension up to 180/110 Check UDS for completion Cocaine positive, holding BB initially, cardiology has added low dose to help with overnight tachyardia and nsvt. Closely monitor for unopposed alpha activity.   Chronic HFrEF  hold torsemide, resume when able to take oral. IV Lasix if needed  Schizoaffective -resume Abilify if able to take oral  COPD -triple nebs during hospitalization Albuterol for rescue  Chronic pain -resume oxycodone and Neurontin when able   Best Practice (right click and "Reselect all SmartList Selections" daily)   Diet/type: NPO DVT prophylaxis systemic heparin Pressure ulcer(s): N/A GI prophylaxis: PPI Lines: N/A Foley:  N/A Code Status:  full code Last date of multidisciplinary goals  of care discussion [no family available currently] -attempted to reach Antonietta Breach, contact person listed in the chart.  Spoke to caregiver Sheralyn Boatman was been with him for 3 weeks but she does not have contact for family    Durel Salts, MD Pulmonary and Critical Care Medicine [facility] 03/05/2024 3:45 PM Pager: see AMION  If no  response to pager, please call critical care on call (see AMION) until 7pm After 7:00 pm call Elink

## 2024-03-05 NOTE — Progress Notes (Signed)
 Patient refuses lab draw for heparin. RN spoke with patient regarding how important it is to check level and to adjust heparin based on level. Patient remains refusing and is just focus on asking for food and drinks. MDs made aware of the refusal. Primary MD to speak with patient at bedside.

## 2024-03-05 NOTE — Progress Notes (Addendum)
 DAILY PROGRESS NOTE   Patient Name: [name] Date of Encounter: 03/05/2024 Cardiologist: Rollene Rotunda, MD  Chief Complaint   No complaints  Patient Profile   [name] is a 58 y.o. male with a hx of cardiomyopathy, CAD, CVA, type II diabetes, amputation who is being seen 03/04/2024 for the evaluation of LV thrombus at the request of Dr. Artis Flock   Subjective   Noted to be in sinus tachycardia overnight with intermittent short bursts of NSVT. Presentation with acute stroke and found to have mobile thrombi on echo.  LVEF <20% with global hypokinesis. LDL 71.  Objective   Vitals:   03/05/24 0600 03/05/24 0700 03/05/24 0812 03/05/24 0902  BP: (!) 146/104 (!) 157/108  (!) 137/103  Pulse: 98 91  92  Resp: (!) 25 (!) 28 20 (!) 29  Temp:   98.2 F (36.8 C)   TempSrc:      SpO2: 100% 98%  99%  Weight:      Height:        Intake/Output Summary (Last 24 hours) at 03/05/2024 0955 Last data filed at 03/05/2024 0900 Gross per 24 hour  Intake 168.55 ml  Output 200 ml  Net -31.45 ml   Filed Weights   03/04/24 1550  Weight: 77 kg    Physical Exam   General appearance: alert and mild distress Lungs: clear to auscultation bilaterally Heart: regular rate and rhythm Extremities: bilateral BKA Neurologic: Mental status: awake, slurred speech  Inpatient Medications    Scheduled Meds:   stroke: early stages of recovery book   Does not apply Once   arformoterol  15 mcg Nebulization BID   budesonide (PULMICORT) nebulizer solution  0.5 mg Nebulization BID   Chlorhexidine Gluconate Cloth  6 each Topical Daily   nicotine  14 mg Transdermal Daily   pantoprazole (PROTONIX) IV  40 mg Intravenous Q24H   revefenacin  175 mcg Nebulization Daily    Continuous Infusions:  heparin 850 Units/hr (03/05/24 0900)    PRN Meds: albuterol, metoprolol tartrate   Labs   Results for orders placed or performed during the hospital encounter of 03/04/24 (from the past 48 hours)  CBG  monitoring, ED     Status: Abnormal   Collection Time: 03/04/24  2:30 PM  Result Value Ref Range   Glucose-Capillary 138 (H) 70 - 99 mg/dL    Comment: Glucose reference range applies only to samples taken after fasting for at least 8 hours.  I-stat chem 8, ED     Status: Abnormal   Collection Time: 03/04/24  2:36 PM  Result Value Ref Range   Sodium 141 135 - 145 mmol/L   Potassium 3.6 3.5 - 5.1 mmol/L   Chloride 109 98 - 111 mmol/L   BUN 19 6 - 20 mg/dL   Creatinine, Ser 4.09 0.61 - 1.24 mg/dL   Glucose, Bld 811 (H) 70 - 99 mg/dL    Comment: Glucose reference range applies only to samples taken after fasting for at least 8 hours.   Calcium, Ion 0.92 (L) 1.15 - 1.40 mmol/L   TCO2 20 (L) 22 - 32 mmol/L   Hemoglobin 15.0 13.0 - 17.0 g/dL   HCT 91.4 78.2 - 95.6 %  Ethanol     Status: None   Collection Time: 03/04/24  2:36 PM  Result Value Ref Range   Alcohol, Ethyl (B) <10 <10 mg/dL    Comment: (NOTE) Lowest detectable limit for serum alcohol is 10 mg/dL.  For medical purposes only.  Performed at [facility], [address] [place], [name] [address]   Protime-INR     Status: Abnormal   Collection Time: 03/04/24  2:36 PM  Result Value Ref Range   Prothrombin Time 18.1 (H) 11.4 - 15.2 seconds   INR 1.5 (H) 0.8 - 1.2    Comment: (NOTE) INR goal varies based on device and disease states. Performed at [facility], [address], [place], [name] [address]   APTT     Status: None   Collection Time: 03/04/24  2:36 PM  Result Value Ref Range   aPTT 30 24 - 36 seconds    Comment: Performed at [facility], [address], [place], [name] [address]  CBC     Status: Abnormal   Collection Time: 03/04/24  2:36 PM  Result Value Ref Range   WBC 6.6 4.0 - 10.5 K/uL   RBC 4.67 4.22 - 5.81 MIL/uL   Hemoglobin 14.3 13.0 - 17.0 g/dL   HCT 91.4 78.2 - 95.6 %   MCV 93.8 80.0 - 100.0 fL   MCH 30.6 26.0 - 34.0 pg   MCHC 32.6 30.0 - 36.0 g/dL   RDW 21.3 (H) 08.6 -  15.5 %   Platelets 210 150 - 400 K/uL   nRBC 0.0 0.0 - 0.2 %    Comment: Performed at [facility], [address], [place], [name] [address]  Differential     Status: None   Collection Time: 03/04/24  2:36 PM  Result Value Ref Range   Neutrophils Relative % 64 %   Neutro Abs 4.2 1.7 - 7.7 K/uL   Lymphocytes Relative 24 %   Lymphs Abs 1.6 0.7 - 4.0 K/uL   Monocytes Relative 11 %   Monocytes Absolute 0.7 0.1 - 1.0 K/uL   Eosinophils Relative 1 %   Eosinophils Absolute 0.0 0.0 - 0.5 K/uL   Basophils Relative 0 %   Basophils Absolute 0.0 0.0 - 0.1 K/uL   Immature Granulocytes 0 %   Abs Immature Granulocytes 0.02 0.00 - 0.07 K/uL    Comment: Performed at [facility], [address] [place], [name] [address]  Comprehensive metabolic panel     Status: Abnormal   Collection Time: 03/04/24  2:36 PM  Result Value Ref Range   Sodium 138 135 - 145 mmol/L   Potassium 3.7 3.5 - 5.1 mmol/L    Comment: HEMOLYSIS AT THIS LEVEL MAY AFFECT RESULT   Chloride 108 98 - 111 mmol/L   CO2 19 (L) 22 - 32 mmol/L   Glucose, Bld 136 (H) 70 - 99 mg/dL    Comment: Glucose reference range applies only to samples taken after fasting for at least 8 hours.   BUN 17 6 - 20 mg/dL   Creatinine, Ser 2.84 0.61 - 1.24 mg/dL   Calcium 7.9 (L) 8.9 - 10.3 mg/dL   Total Protein 5.7 (L) 6.5 - 8.1 g/dL   Albumin 2.9 (L) 3.5 - 5.0 g/dL   AST 59 (H) 15 - 41 U/L    Comment: HEMOLYSIS AT THIS LEVEL MAY AFFECT RESULT   ALT 87 (H) 0 - 44 U/L    Comment: HEMOLYSIS AT THIS LEVEL MAY AFFECT RESULT   Alkaline Phosphatase 67 38 - 126 U/L   Total Bilirubin 2.4 (H) 0.0 - 1.2 mg/dL    Comment: HEMOLYSIS AT THIS LEVEL MAY AFFECT RESULT   GFR, Estimated >60 >60 mL/min    Comment: (NOTE)  Calculated using the CKD-EPI Creatinine Equation (2021)    Anion gap 11 5 - 15    Comment: Performed at [facility], [address], [place], [name] [address]  Magnesium     Status: Abnormal   Collection Time: 03/04/24   4:21 PM  Result Value Ref Range   Magnesium 1.3 (L) 1.7 - 2.4 mg/dL    Comment: HEMOLYSIS AT THIS LEVEL MAY AFFECT RESULT Performed at [facility], [address] [place], [name] [address]   MRSA Next Gen by PCR, Nasal     Status: None   Collection Time: 03/04/24 11:10 PM   Specimen: Nasal Mucosa; Nasal Swab  Result Value Ref Range   MRSA by PCR Next Gen NOT DETECTED NOT DETECTED    Comment: (NOTE) The GeneXpert MRSA Assay (FDA approved for NASAL specimens only), is one component of a comprehensive MRSA colonization surveillance program. It is not intended to diagnose MRSA infection nor to guide or monitor treatment for MRSA infections. Test performance is not FDA approved in patients less than 27 years old. Performed at [facility], [address], [place], [name] [address]   Lipid panel     Status: Abnormal   Collection Time: 03/05/24  6:09 AM  Result Value Ref Range   Cholesterol 110 0 - 200 mg/dL   Triglycerides 51 <213 mg/dL   HDL 29 (L) >08 mg/dL   Total CHOL/HDL Ratio 3.8 RATIO   VLDL 10 0 - 40 mg/dL   LDL Cholesterol 71 0 - 99 mg/dL    Comment:        Total Cholesterol/HDL:CHD Risk Coronary Heart Disease Risk Table                     Men   Women  1/2 Average Risk   3.4   3.3  Average Risk       5.0   4.4  2 X Average Risk   9.6   7.1  3 X Average Risk  23.4   11.0        Use the calculated Patient Ratio above and the CHD Risk Table to determine the patient's CHD Risk.        ATP III CLASSIFICATION (LDL):  <100     mg/dL   Optimal  657-846  mg/dL   Near or Above                    Optimal  130-159  mg/dL   Borderline  962-952  mg/dL   High  >841     mg/dL   Very High Performed at [facility], [address] [place], [name] [address]   Basic metabolic panel     Status: Abnormal   Collection Time: 03/05/24  6:09 AM  Result Value Ref Range   Sodium 142 135 - 145 mmol/L   Potassium 3.7 3.5 - 5.1 mmol/L   Chloride 112 (H) 98 - 111  mmol/L   CO2 19 (L) 22 - 32 mmol/L   Glucose, Bld 74 70 - 99 mg/dL    Comment: Glucose reference range applies only to samples taken after fasting for at least 8 hours.   BUN 13 6 - 20 mg/dL   Creatinine, Ser 1.02 0.61 - 1.24 mg/dL   Calcium 8.3 (L) 8.9 - 10.3 mg/dL   GFR, Estimated >72 >53 mL/min    Comment: (NOTE) Calculated using the CKD-EPI Creatinine Equation (2021)  Anion gap 11 5 - 15    Comment: Performed at [facility], [address] [place], [name] [address]  CBC     Status: Abnormal   Collection Time: 03/05/24  6:09 AM  Result Value Ref Range   WBC 6.4 4.0 - 10.5 K/uL   RBC 4.53 4.22 - 5.81 MIL/uL   Hemoglobin 13.9 13.0 - 17.0 g/dL   HCT 60.4 54.0 - 98.1 %   MCV 93.4 80.0 - 100.0 fL   MCH 30.7 26.0 - 34.0 pg   MCHC 32.9 30.0 - 36.0 g/dL   RDW 19.1 (H) 47.8 - 29.5 %   Platelets 229 150 - 400 K/uL   nRBC 0.0 0.0 - 0.2 %    Comment: Performed at [facility], [address], [place], [name] [address]  Antithrombin III     Status: Abnormal   Collection Time: 03/05/24  6:09 AM  Result Value Ref Range   AntiThromb III Func 70 (L) 75 - 120 %    Comment: Performed at [facility], [address], [place], [name] [address]  Heparin level (unfractionated)     Status: Abnormal   Collection Time: 03/05/24  7:00 AM  Result Value Ref Range   Heparin Unfractionated <0.10 (L) 0.30 - 0.70 IU/mL    Comment: (NOTE) The clinical reportable range upper limit is being lowered to >1.10 to align with the FDA approved guidance for the current laboratory assay.  If heparin results are below expected values, and patient dosage has  been confirmed, suggest follow up testing of antithrombin III levels. Performed at [facility], [address] [place], [name] [address]   Glucose, capillary     Status: Abnormal   Collection Time: 03/05/24  7:27 AM  Result Value Ref Range   Glucose-Capillary 57 (L) 70 - 99 mg/dL    Comment: Glucose reference range  applies only to samples taken after fasting for at least 8 hours.  Glucose, capillary     Status: Abnormal   Collection Time: 03/05/24  8:27 AM  Result Value Ref Range   Glucose-Capillary 125 (H) 70 - 99 mg/dL    Comment: Glucose reference range applies only to samples taken after fasting for at least 8 hours.    ECG   N/A  Telemetry   Sinus tachycardia with recurrent short bursts of NSVT overnight - Personally Reviewed  Radiology    MR BRAIN WO CONTRAST Result Date: 03/04/2024 CLINICAL DATA:  Stroke follow-up EXAM: MRI HEAD WITHOUT CONTRAST TECHNIQUE: Multiplanar, multiecho pulse sequences of the brain and surrounding structures were obtained without intravenous contrast. COMPARISON:  03/05/2019 and 01/29/2022 FINDINGS: Brain: Area of abnormal diffusion restriction affecting the left basal ganglia and insula with punctate foci that extend to the subcortical white matter of the left frontal and parietal lobes. Chronic microhemorrhage in the left centrum semiovale. There is multifocal hyperintense T2-weighted signal within the white matter. Parenchymal volume and CSF spaces are normal. The midline structures are normal. Multiple old cerebellar infarcts. Vascular: Normal flow voids. Skull and upper cervical spine: Normal calvarium and skull base. Visualized upper cervical spine and soft tissues are normal. Sinuses/Orbits:No paranasal sinus fluid levels or advanced mucosal thickening. No mastoid or middle ear effusion. Normal orbits. IMPRESSION: 1. Acute infarct of the left basal ganglia and insula with punctate foci that extend to the subcortical white matter of the left frontal and parietal lobes. No hemorrhage or mass effect. 2. Multiple old cerebellar infarcts and  findings of chronic small vessel ischemia. Electronically Signed   By: Deatra Robinson M.D.   On: 03/04/2024 19:45   ECHOCARDIOGRAM LIMITED Result Date: 03/04/2024    ECHOCARDIOGRAM REPORT   Patient Name:   [name] WEYENBERG Date of Exam:  03/04/2024 Medical Rec #:  [id number]      Height:       50.0 in Accession #:    [id number]     Weight:       169.8 lb Date of Birth:  08-31-1966      BSA:          1.525 m Patient Age:    57 years       BP:           171/102 mmHg Patient Gender: M              HR:           76 bpm. Exam Location:  Inpatient Procedure: Limited Echo and Color Doppler (Both Spectral and Color Flow Doppler            were utilized during procedure). Indications:    stroke  History:        Patient has prior history of Echocardiogram examinations, most                 recent 01/14/2024.  Sonographer:    Delcie Roch RDCS Referring Phys: [phone] DENISE A WOLFE IMPRESSIONS  1. The apex has prominen trabecualtions. There is a large mobile clot in the LV that appears to be attached to the interventricular septum just under the mitral valve. . Left ventricular ejection fraction, by estimation, is <20%. The left ventricle has severely decreased function. The left ventricle demonstrates global hypokinesis. Left ventricular diastolic function could not be evaluated.  2. There is a small RV apical clot. Right ventricular systolic function is severely reduced. The right ventricular size is normal.  3. There is a largr mobile clot in the LA. Left atrial size was mildly dilated.  4. Right atrial size was mildly dilated.  5. The mitral valve is normal in structure. Trivial mitral valve regurgitation. No evidence of mitral stenosis.  6. The aortic valve is normal in structure. Aortic valve regurgitation is not visualized. No aortic stenosis is present. Conclusion(s)/Recommendation(s): Severe biventricular dysfunction with large mobile clots in the LA, RV and LV as described above. FINDINGS  Left Ventricle: The apex has prominen trabecualtions. There is a large mobile clot in the LV that appears to be attached to the interventricular septum just under the mitral valve. Left ventricular ejection fraction, by estimation, is <20%. The left ventricle has  severely decreased function. The left ventricle demonstrates global hypokinesis. The left ventricular internal cavity size was normal in size. There is no left ventricular hypertrophy. Left ventricular diastolic function could not be evaluated. Right Ventricle: There is a small RV apical clot. The right ventricular size is normal. No increase in right ventricular wall thickness. Right ventricular systolic function is severely reduced. Left Atrium: There is a largr mobile clot in the LA. Left atrial size was mildly dilated. Right Atrium: Right atrial size was mildly dilated. Pericardium: There is no evidence of pericardial effusion. Mitral Valve: The mitral valve is normal in structure. Trivial mitral valve regurgitation. No evidence of mitral valve stenosis. Tricuspid Valve: The tricuspid valve is normal in structure. Tricuspid valve regurgitation is trivial. No evidence of tricuspid stenosis. Aortic Valve: The aortic valve is normal in structure. Aortic valve regurgitation is not  visualized. No aortic stenosis is present. Pulmonic Valve: The pulmonic valve was not well visualized. Pulmonic valve regurgitation is not visualized. No evidence of pulmonic stenosis. Aorta: The aortic root is normal in size and structure. Venous: The inferior vena cava was not well visualized. IAS/Shunts: No atrial level shunt detected by color flow Doppler. Arvilla Meres MD Electronically signed by Arvilla Meres MD Signature Date/Time: 03/04/2024/4:59:08 PM    Final    CT ANGIO HEAD NECK W WO CM W PERF (CODE STROKE) Result Date: 03/04/2024 CLINICAL DATA:  Provided history: Neuro deficit, acute, stroke suspected. EXAM: CT ANGIOGRAPHY HEAD AND NECK CT PERFUSION BRAIN TECHNIQUE: Multidetector CT imaging of the head and neck was performed using the standard protocol during bolus administration of intravenous contrast. Multiplanar CT image reconstructions and MIPs were obtained to evaluate the vascular anatomy. Carotid stenosis  measurements (when applicable) are obtained utilizing NASCET criteria, using the distal internal carotid diameter as the denominator. Multiphase CT imaging of the brain was performed following IV bolus contrast injection. Subsequent parametric perfusion maps were calculated using RAPID software. RADIATION DOSE REDUCTION: This exam was performed according to the departmental dose-optimization program which includes automated exposure control, adjustment of the mA and/or kV according to patient size and/or use of iterative reconstruction technique. CONTRAST:  Administered contrast not known at this time. COMPARISON:  Non-contrast head CT performed earlier today 03/04/2024. CT angiogram head/neck 02/03/2022. FINDINGS: CTA NECK FINDINGS Aortic arch: Standard aortic branching. A sclerotic plaque within the visualized thoracic aorta and proximal major branch vessels of the neck. No hemodynamically significant innominate or proximal subclavian artery stenosis. Right carotid system: CCA and ICA patent within the neck. Bulky predominately calcified aspect plaque about the carotid bifurcation with severe (greater than 90%) short-segment stenosis at the ICA origin. Noncalcified atherosclerotic plaque elsewhere within the mid and distal CCA resulting in less than 50% stenosis. Partially retropharyngeal course of the cervical ICA. Left carotid system: CCA and ICA patent within the neck without hemodynamically significant stenosis (50% or greater). Mild sclerotic plaque within the mid CCA. Moderate sclerotic plaque about the carotid bifurcation and within the proximal ICA. Vertebral arteries: Vertebral arteries patent within the neck. Progressive severe stenosis of the left vertebral artery proximal V1 segment. Skeleton: The patient is edentulous. Cervical spondylosis. No acute fracture or aggressive osseous lesion. Other neck: No neck mass or cervical lymphadenopathy. Upper chest: No consolidation within the imaged lung apices.  Centrilobular emphysema. Review of the MIP images confirms the above findings CTA HEAD FINDINGS Anterior circulation: The intracranial internal carotid arteries are patent. Atherosclerotic plaque within both vessels with no more than mild stenosis. The M1 middle cerebral arteries are patent. A proximal M2 left middle cerebral artery vessel is occluded at its origin, new from the CTA 02/03/2022 (for instance as seen on series 11, image 25). No right M2 proximal branch occlusion or high-grade proximal stenosis is identified. The anterior cerebral arteries are patent. Developmentally hypoplastic or absent right A1 segment. No intracranial aneurysm is identified. Posterior circulation: The intracranial vertebral arteries are patent. Severe near occlusive stenoses of the left vertebral artery proximal to, at the level of, and distal to the PICA origin. These stenoses are new from the prior CTA of 02/03/2022. The basilar artery is patent. The posterior cerebral arteries are patent. Severe stenosis within a right PCA branch at the P3/P4 junction, new from the prior CTA. Posterior communicating arteries are hypoplastic or absent, bilaterally. Venous sinuses: Within the limitations of contrast timing, no convincing thrombus. Anatomic variants: As  described. Review of the MIP images confirms the above findings CT Brain Perfusion Findings: CBF (<30%) Volume: 0mL Perfusion (Tmax>6.0s) volume: (predominately within the left MCA vascular territory, although with some additional foci in the right cerebral watershed territory). Mismatch Volume: Infarction Location: None identified. However, an acute infarct was demonstrated within the left MCA territory on the non-contrast head CT performed earlier today. CTA head impression #1 called by telephone at the time of interpretation on 03/04/2024 at 3:26 pm to provider [facility] , who verbally acknowledged these results. IMPRESSION: CTA neck: 1. The common carotid and  internal carotid arteries are patent with neck. Atherosclerotic plaque bilaterally, as described. Most notably, there is a severe (greater than 90%) stenosis at the right ICA origin which is similar to the prior CTA of 02/03/2022. 2. The vertebral arteries are patent in the neck. Progressive severe stenosis of the left vertebral artery proximal V1 segment. 3. Aortic Atherosclerosis (ICD10-I70.0) and Emphysema (ICD10-J43.9). CTA head: 1. Acute occlusion of a proximal M2 left middle cerebral artery vessel. 2. Background intracranial atherosclerotic disease with multifocal stenoses, most notably as follows. 3. 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 4. Severe stenosis of a right posterior cerebral artery branch at the P3/P4 junction, also new from the prior exam. CT perfusion head: The perfusion software identifies regions of hypoperfusion totaling 117 mL (utilizing the Tmax>6 seconds threshold). These foci are predominantly located within the left MCA vascular territory, although some foci are present within the right cerebral watershed territory. The perfusion software identifies no core infarct. However, an acute infarct was demonstrated on the non-contrast head CT performed earlier today, as described. The perfusion software reports a mismatch volume of 117 mL. 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 Electronically Signed   By: Jackey Loge D.O.   On: 03/04/2024 15:45   CT HEAD CODE STROKE WO CONTRAST Result Date: 03/04/2024 CLINICAL DATA:  Code stroke. Neuro deficit, acute, stroke suspected. EXAM: CT HEAD WITHOUT CONTRAST TECHNIQUE: Contiguous axial images were obtained from the base of the skull through the vertex without intravenous contrast. RADIATION DOSE REDUCTION: This exam was performed according to the  departmental dose-optimization program which includes automated exposure control, adjustment of the mA and/or kV according to patient size and/or use of iterative reconstruction technique. COMPARISON:  Non-contrast head CT, CT angiogram head/neck and CT venogram head 02/03/2022. FINDINGS: Brain: Mild generalized parenchymal atrophy. Acute left MCA territory infarct affecting the left insula, left caudate nucleus, left lentiform nucleus and left internal capsule. Background moderate patchy and ill-defined hypoattenuation within cerebral white matter, nonspecific but compatible with chronic small vessel ischemic disease. 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 There is no acute intracranial hemorrhage. No extra-axial fluid collection. No evidence of an intracranial mass. No midline shift. Vascular: Asymmetric hyperdensity of the M1/proximal M2 left middle cerebral artery suspicious for the presence of thrombus. Atherosclerotic calcifications. Skull: No calvarial fracture or aggressive osseous lesion. Sinuses/Orbits: No mass or acute finding within the imaged orbits. Chronic medially displaced fracture deformity of the left lamina papyracea. No significant paranasal sinus disease at the imaged levels. ASPECTS [facility] Stroke Program Early CT Score) - Ganglionic level infarction (caudate, lentiform nuclei, internal capsule, insula, M1-M3 cortex): 3 - Supraganglionic infarction (M4-M6 cortex): 3 Total score (0-10 with 10 being normal):  6 Impressions #1 and #2 called by telephone at the time of interpretation on 03/04/2024 at 2:56 pm to provider Dr. Armanda Magic, who verbally acknowledged these results. IMPRESSION: 1. Acute left MCA territory infarct affecting the insula, caudate nucleus, lentiform nucleus and internal capsule. ASPECTS is 6. 2. Asymmetric hyperdensity of the M1/proximal M2 left middle cerebral artery suspicious for the presence of thrombus. A  CTA of the head/neck is recommended for further evaluation. 3. Background parenchymal atrophy, chronic small vessel ischemic disease and chronic infarcts, as described. Electronically Signed   By: Jackey Loge D.O.   On: 03/04/2024 14:58    Cardiac Studies   See echo above  Assessment   Principal Problem:   Acute CVA (cerebrovascular accident) [facility]) Active Problems:   Schizoaffective disorder, bipolar type (HCC)   Cocaine abuse (HCC)   Type 2 diabetes mellitus (HCC)   Prolonged QT interval   Acid reflux   History of DVT (deep vein thrombosis)   Status post bilateral BKA.   COPD (chronic obstructive pulmonary disease) (HCC)   Chronic systolic CHF (congestive heart failure) (HCC)   Benign essential HTN   Acute thrombus of left ventricle (HCC)   Elevated liver enzymes   Dilated cardiomyopathy (HCC)   Plan   Limited options in the setting of acute stroke. Currently on IV Heparin with multiple cardiac thrombi in the setting of low LVEF. Tachycardia and NSVT overnight- BP elevated, would restart carvedilol 3.125 mg BID - monitor tolerance. May be able to add low dose losartan at some point, if permissive hypertension allows. LDL 71 - start low dose rosuvastatin 5 mg daily -monitor liver enzymes which were mildly elevated at 59/87.  Time Spent Directly with Patient:  I have spent a total of 35 minutes with the patient reviewing hospital notes, telemetry, EKGs, labs and examining the patient as well as establishing an assessment and plan that was discussed personally with the patient.  > 50% of time was spent in direct patient care.  Length of Stay:  LOS: 1 day   Chrystie Nose, MD, [facility], FACP    [facility]  Medical Director of the [facility]  [facility] Diplomate of the American Board of Clinical Lipidology Attending Cardiologist  Direct Dial: [phone]  Fax: [phone]  Website:  [email or web address] 03/05/2024, 9:55 AM

## 2024-03-05 NOTE — Progress Notes (Signed)
 Hypoglycemic Event  CBG: 69  Treatment: Attempted to give dextrose 50% amp, patient refuses, pushes RN aware whenever RN got near him. Patient is insistent on [place] giving him food to eat.   Symptoms: no symptoms noted; patient is talking more  Follow-up CBG: Time: patient refuses repeat CBG test  Possible Reasons for Event: Inadequate meal intake  Comments/MD notified: Both Dr. Pearlean Brownie and Dr. Celine Mans made aware.     Jaszmine Navejas

## 2024-03-05 NOTE — Progress Notes (Signed)
 Hypoglycemic Event  CBG: 57  Treatment: D50 50 mL (25 gm)  Symptoms: None  Follow-up CBG: Time:0827 CBG Result: 125  Possible Reasons for Event: Inadequate meal intake  Comments/MD notified: Made aware during rounds this morning.    Danine Hor

## 2024-03-05 NOTE — Progress Notes (Signed)
 STROKE TEAM PROGRESS NOTE   BRIEF HPI [name] is a 58 y.o. 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     SIGNIFICANT HOSPITAL EVENTS 4/4:  - Presentation for slurred speech ad right-sided weakness - CT head revealed an acute left MCA territory infarct affecting the insula, caudate nucleus, lentiform nucleus, and internal capsule within aspects of 6.  Also showed hyperdensity of the M1/proximal M2 left MCA.  - CTA showed acute occlusion of the proximal M2 left MCA, severe near occlusive stenosis of the left vertebral artery, severe stenosis of the right posterior cerebral artery branch at P3/P4 junction.  - Patient not a candidate for IR as his last known well was 26 hours PTA with a modified Rankin score of 4 and aspects of 6. - 2D Echo: Severe biventricular dysfunction with large mobile clots in the LA, RV, and LV.  LVEF <20%.  Mild dilation of left and right atria. Patient was started on a heparin gtt and admitted to the ICU   INTERIM HISTORY/SUBJECTIVE Vital signs stable overnight, patient sleeping in bed this morning, no family at bedside.  Patient is in no acute distress.  Reports of some hypoglycemic episodes with POC glucose as low as 57 this morning treated with dextrose AMP.  Will attempt NGT placement for tube feeds until Coretrack can be placed 03/07/24.  Patient is pending repeat speech evaluation as he failed his initial bedside swallow.   OBJECTIVE CBC    Component Value Date/Time   WBC 6.4 03/05/2024 0609   RBC 4.53 03/05/2024 0609   HGB 13.9 03/05/2024 0609   HCT 42.3 03/05/2024 0609   PLT 229  03/05/2024 0609   MCV 93.4 03/05/2024 0609   MCH 30.7 03/05/2024 0609   MCHC 32.9 03/05/2024 0609   RDW 16.5 (H) 03/05/2024 0609   LYMPHSABS 1.6 03/04/2024 1436   MONOABS 0.7 03/04/2024 1436   EOSABS 0.0 03/04/2024 1436   BASOSABS 0.0 03/04/2024 1436   BMET    Component Value Date/Time   NA 142 03/05/2024 0609   K 3.7 03/05/2024 0609   CL 112 (H) 03/05/2024 0609   CO2 19 (L) 03/05/2024 0609   GLUCOSE 74 03/05/2024 0609   BUN 13 03/05/2024 0609   CREATININE 1.08 03/05/2024 0609   CALCIUM 8.3 (L) 03/05/2024 0609   GFRNONAA >60 03/05/2024 0609   Lab Results  Component Value Date   CHOL 110 03/05/2024   HDL 29 (L) 03/05/2024   LDLCALC 71 03/05/2024   TRIG 51 03/05/2024   CHOLHDL 3.8 03/05/2024   Lab Results  Component Value Date   HGBA1C 5.8 (H) 01/02/2024   Drugs of Abuse     Component Value Date/Time   LABOPIA NONE DETECTED 01/14/2024 0907   COCAINSCRNUR POSITIVE (A) 01/14/2024 0907   COCAINSCRNUR POSITIVE (A) 12/25/2016 1533   LABBENZ NONE DETECTED 01/14/2024 0907   AMPHETMU NONE DETECTED 01/14/2024 0907   THCU NONE DETECTED 01/14/2024 0907   LABBARB NONE DETECTED 01/14/2024 0907    IMAGING past 24 hours MR BRAIN WO CONTRAST Result Date: 03/04/2024 CLINICAL DATA:  Stroke follow-up EXAM: MRI HEAD WITHOUT CONTRAST TECHNIQUE: Multiplanar, multiecho pulse sequences of the brain and surrounding structures were obtained without  intravenous contrast. COMPARISON:  03/05/2019 and 01/29/2022 FINDINGS: Brain: Area of abnormal diffusion restriction affecting the left basal ganglia and insula with punctate foci that extend to the subcortical white matter of the left frontal and parietal lobes. Chronic microhemorrhage in the left centrum semiovale. There is multifocal hyperintense T2-weighted signal within the white matter. Parenchymal volume and CSF spaces are normal. The midline structures are normal. Multiple old cerebellar infarcts. Vascular: Normal flow voids. Skull and upper  cervical spine: Normal calvarium and skull base. Visualized upper cervical spine and soft tissues are normal. Sinuses/Orbits:No paranasal sinus fluid levels or advanced mucosal thickening. No mastoid or middle ear effusion. Normal orbits. IMPRESSION: 1. Acute infarct of the left basal ganglia and insula with punctate foci that extend to the subcortical white matter of the left frontal and parietal lobes. No hemorrhage or mass effect. 2. Multiple old cerebellar infarcts and findings of chronic small vessel ischemia. Electronically Signed   By: Deatra Robinson M.D.   On: 03/04/2024 19:45   ECHOCARDIOGRAM LIMITED Result Date: 03/04/2024    ECHOCARDIOGRAM REPORT   Patient Name:   [name] Date of Exam: 03/04/2024 Medical Rec #:  [id number]      Height:       50.0 in Accession #:    [id number]     Weight:       169.8 lb Date of Birth:  25-Mar-1966      BSA:          1.525 m Patient Age:    57 years       BP:           171/102 mmHg Patient Gender: M              HR:           76 bpm. Exam Location:  Inpatient Procedure: Limited Echo and Color Doppler (Both Spectral and Color Flow Doppler            were utilized during procedure). Indications:    stroke  History:        Patient has prior history of Echocardiogram examinations, most                 recent 01/14/2024.  Sonographer:    Delcie Roch RDCS Referring Phys: [id number] DENISE A WOLFE IMPRESSIONS  1. The apex has prominen trabecualtions. There is a large mobile clot in the LV that appears to be attached to the interventricular septum just under the mitral valve. . Left ventricular ejection fraction, by estimation, is <20%. The left ventricle has severely decreased function. The left ventricle demonstrates global hypokinesis. Left ventricular diastolic function could not be evaluated.  2. There is a small RV apical clot. Right ventricular systolic function is severely reduced. The right ventricular size is normal.  3. There is a largr mobile clot in the LA. Left  atrial size was mildly dilated.  4. Right atrial size was mildly dilated.  5. The mitral valve is normal in structure. Trivial mitral valve regurgitation. No evidence of mitral stenosis.  6. The aortic valve is normal in structure. Aortic valve regurgitation is not visualized. No aortic stenosis is present. Conclusion(s)/Recommendation(s): Severe biventricular dysfunction with large mobile clots in the LA, RV and LV as described above. FINDINGS  Left Ventricle: The apex has prominen trabecualtions. There is a large mobile clot in the LV that appears to be attached to the interventricular septum just under the mitral valve. Left ventricular ejection fraction, by estimation, is <20%. The  left ventricle has severely decreased function. The left ventricle demonstrates global hypokinesis. The left ventricular internal cavity size was normal in size. There is no left ventricular hypertrophy. Left ventricular diastolic function could not be evaluated. Right Ventricle: There is a small RV apical clot. The right ventricular size is normal. No increase in right ventricular wall thickness. Right ventricular systolic function is severely reduced. Left Atrium: There is a largr mobile clot in the LA. Left atrial size was mildly dilated. Right Atrium: Right atrial size was mildly dilated. Pericardium: There is no evidence of pericardial effusion. Mitral Valve: The mitral valve is normal in structure. Trivial mitral valve regurgitation. No evidence of mitral valve stenosis. Tricuspid Valve: The tricuspid valve is normal in structure. Tricuspid valve regurgitation is trivial. No evidence of tricuspid stenosis. Aortic Valve: The aortic valve is normal in structure. Aortic valve regurgitation is not visualized. No aortic stenosis is present. Pulmonic Valve: The pulmonic valve was not well visualized. Pulmonic valve regurgitation is not visualized. No evidence of pulmonic stenosis. Aorta: The aortic root is normal in size and  structure. Venous: The inferior vena cava was not well visualized. IAS/Shunts: No atrial level shunt detected by color flow Doppler. Arvilla Meres MD Electronically signed by Arvilla Meres MD Signature Date/Time: 03/04/2024/4:59:08 PM    Final    CT ANGIO HEAD NECK W WO CM W PERF (CODE STROKE) Result Date: 03/04/2024 CLINICAL DATA:  Provided history: Neuro deficit, acute, stroke suspected. EXAM: CT ANGIOGRAPHY HEAD AND NECK CT PERFUSION BRAIN TECHNIQUE: Multidetector CT imaging of the head and neck was performed using the standard protocol during bolus administration of intravenous contrast. Multiplanar CT image reconstructions and MIPs were obtained to evaluate the vascular anatomy. Carotid stenosis measurements (when applicable) are obtained utilizing NASCET criteria, using the distal internal carotid diameter as the denominator. Multiphase CT imaging of the brain was performed following IV bolus contrast injection. Subsequent parametric perfusion maps were calculated using RAPID software. RADIATION DOSE REDUCTION: This exam was performed according to the departmental dose-optimization program which includes automated exposure control, adjustment of the mA and/or kV according to patient size and/or use of iterative reconstruction technique. CONTRAST:  Administered contrast not known at this time. COMPARISON:  Non-contrast head CT performed earlier today 03/04/2024. CT angiogram head/neck 02/03/2022. FINDINGS: CTA NECK FINDINGS Aortic arch: Standard aortic branching. A sclerotic plaque within the visualized thoracic aorta and proximal major branch vessels of the neck. No hemodynamically significant innominate or proximal subclavian artery stenosis. Right carotid system: CCA and ICA patent within the neck. Bulky predominately calcified aspect plaque about the carotid bifurcation with severe (greater than 90%) short-segment stenosis at the ICA origin. Noncalcified atherosclerotic plaque elsewhere within the mid  and distal CCA resulting in less than 50% stenosis. Partially retropharyngeal course of the cervical ICA. Left carotid system: CCA and ICA patent within the neck without hemodynamically significant stenosis (50% or greater). Mild sclerotic plaque within the mid CCA. Moderate sclerotic plaque about the carotid bifurcation and within the proximal ICA. Vertebral arteries: Vertebral arteries patent within the neck. Progressive severe stenosis of the left vertebral artery proximal V1 segment. Skeleton: The patient is edentulous. Cervical spondylosis. No acute fracture or aggressive osseous lesion. Other neck: No neck mass or cervical lymphadenopathy. Upper chest: No consolidation within the imaged lung apices. Centrilobular emphysema. Review of the MIP images confirms the above findings CTA HEAD FINDINGS Anterior circulation: The intracranial internal carotid arteries are patent. Atherosclerotic plaque within both vessels with no more than mild stenosis. The M1  middle cerebral arteries are patent. A proximal M2 left middle cerebral artery vessel is occluded at its origin, new from the CTA 02/03/2022 (for instance as seen on series 11, image 25). No right M2 proximal branch occlusion or high-grade proximal stenosis is identified. The anterior cerebral arteries are patent. Developmentally hypoplastic or absent right A1 segment. No intracranial aneurysm is identified. Posterior circulation: The intracranial vertebral arteries are patent. Severe near occlusive stenoses of the left vertebral artery proximal to, at the level of, and distal to the PICA origin. These stenoses are new from the prior CTA of 02/03/2022. The basilar artery is patent. The posterior cerebral arteries are patent. Severe stenosis within a right PCA branch at the P3/P4 junction, new from the prior CTA. Posterior communicating arteries are hypoplastic or absent, bilaterally. Venous sinuses: Within the limitations of contrast timing, no convincing  thrombus. Anatomic variants: As described. Review of the MIP images confirms the above findings CT Brain Perfusion Findings: CBF (<30%) Volume: 0mL Perfusion (Tmax>6.0s) volume: (predominately within the left MCA vascular territory, although with some additional foci in the right cerebral watershed territory). Mismatch Volume: Infarction Location: None identified. However, an acute infarct was demonstrated within the left MCA territory on the non-contrast head CT performed earlier today. CTA head impression #1 called by telephone at the time of interpretation on 03/04/2024 at 3:26 pm to provider [facility] , who verbally acknowledged these results. IMPRESSION: CTA neck: 1. The common carotid and internal carotid arteries are patent with neck. Atherosclerotic plaque bilaterally, as described. Most notably, there is a severe (greater than 90%) stenosis at the right ICA origin which is similar to the prior CTA of 02/03/2022. 2. The vertebral arteries are patent in the neck. Progressive severe stenosis of the left vertebral artery proximal V1 segment. 3. Aortic Atherosclerosis (ICD10-I70.0) and Emphysema (ICD10-J43.9). CTA head: 1. Acute occlusion of a proximal M2 left middle cerebral artery vessel. 2. Background intracranial atherosclerotic disease with multifocal stenoses, most notably as follows. 3. 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 4. Severe stenosis of a right posterior cerebral artery branch at the P3/P4 junction, also new from the prior exam. CT perfusion head: The perfusion software identifies regions of hypoperfusion totaling 117 mL (utilizing the Tmax>6 seconds threshold). These foci are predominantly located within the left MCA vascular territory, although some foci are present within the right cerebral watershed territory. The perfusion software identifies no core infarct. However, an acute infarct was  demonstrated on the non-contrast head CT performed earlier today, as described. The perfusion software reports a mismatch volume of 117 mL. 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 Electronically Signed   By: Jackey Loge D.O.   On: 03/04/2024 15:45   CT HEAD CODE STROKE WO CONTRAST Result Date: 03/04/2024 CLINICAL DATA:  Code stroke. Neuro deficit, acute, stroke suspected. EXAM: CT HEAD WITHOUT CONTRAST TECHNIQUE: Contiguous axial images were obtained from the base of the skull through the vertex without intravenous contrast. RADIATION DOSE REDUCTION: This exam was performed according to the departmental dose-optimization program which includes automated exposure control, adjustment of the mA and/or kV according to patient size and/or use of iterative reconstruction technique. COMPARISON:  Non-contrast head CT, CT angiogram head/neck and CT venogram head 02/03/2022. FINDINGS: Brain: Mild generalized parenchymal atrophy. Acute left MCA territory infarct affecting the left insula, left caudate nucleus,  left lentiform nucleus and left internal capsule. Background moderate patchy and ill-defined hypoattenuation within cerebral white matter, nonspecific but compatible with chronic small vessel ischemic disease. 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 There is no acute intracranial hemorrhage. No extra-axial fluid collection. No evidence of an intracranial mass. No midline shift. Vascular: Asymmetric hyperdensity of the M1/proximal M2 left middle cerebral artery suspicious for the presence of thrombus. Atherosclerotic calcifications. Skull: No calvarial fracture or aggressive osseous lesion. Sinuses/Orbits: No mass or acute finding within the imaged orbits. Chronic medially displaced fracture deformity of the left lamina  papyracea. No significant paranasal sinus disease at the imaged levels. ASPECTS ([place]berta Stroke Program Early CT Score) - Ganglionic level infarction (caudate, lentiform nuclei, internal capsule, insula, M1-M3 cortex): 3 - Supraganglionic infarction (M4-M6 cortex): 3 Total score (0-10 with 10 being normal): 6 Impressions #1 and #2 called by telephone at the time of interpretation on 03/04/2024 at 2:56 pm to provider Dr. Armanda Magic, who verbally acknowledged these results. IMPRESSION: 1. Acute left MCA territory infarct affecting the insula, caudate nucleus, lentiform nucleus and internal capsule. ASPECTS is 6. 2. Asymmetric hyperdensity of the M1/proximal M2 left middle cerebral artery suspicious for the presence of thrombus. A CTA of the head/neck is recommended for further evaluation. 3. Background parenchymal atrophy, chronic small vessel ischemic disease and chronic infarcts, as described. Electronically Signed   By: Jackey Loge D.O.   On: 03/04/2024 14:58   Vitals:   03/05/24 0400 03/05/24 0500 03/05/24 0600 03/05/24 0700  BP: (!) 143/91 (!) 152/102 (!) 146/104 (!) 157/108  Pulse: 89 92 98 91  Resp: (!) 27 13 (!) 25 (!) 28  Temp: 98 F (36.7 C)     TempSrc: Axillary     SpO2: 99% 98% 100% 98%  Weight:      Height:       PHYSICAL EXAM General:  Acutely ill appearing African American male laying in ICU bed in no acute distress Psych:  Unable to assess, patient is drowsy in the bed with only brief arousal to repeat stimulation. CV: Regular rate and rhythm on monitor Respiratory:  Regular, unlabored respirations on room air GI: Abdomen soft and nontender  NEURO:  Mental Status: Drowsy, requires repeat stimulation for brief arousal.  Wakes to constant tactile stimulation and loud voice. Occasionally follows simple commands.  Speech/Language: Attention and comprehension impaired.  Speech is severely dysarthric with very minimal verbal responses to examiner.  Does not participate in naming objects or  repeating phrases. Speech is limited and unintelligible.  Patient has a left gaze preference but will occasionally track right of midline.  Right hemianopia. Right facial droop. Severe dysarthria. Minimal midline tongue protrusion to command.  Does not participate with coordination testing.  Patient has spontaneous and occasional antigravity movement on the left arm and left stump.  Right hemiplegia is present with minimal movement without gravity on the right.  Gait deferred due to bilateral chronic BKA.  ASSESSMENT/PLAN Acute Ischemic Infarct: Acute infarct of the left basal ganglia and insula with punctate foci that extend to the subcortical white matter of the left frontal and parietal lobes. Etiology:  Cardioembolic with findings of large mobile clots in the LA, RV, and LV on 2d echo.  CT head: Left MCA territory infarct affecting the insula, caudate nucleus, lentiform nucleus, and internal capsule.  Aspects is 6.  Asymmetric hyperdensity of the M1/proximal M2 left MCA suspicious for the presence of thrombus.  A CTA  of the head/neck is recommended for further evaluation.  Background parenchymal atrophy, chronic small vessel ischemic disease and chronic infarcts, as described. CTA head: The common carotid and internal carotid arteries are patent within the neck.  Atherosclerotic plaque bilaterally, as described.  Most notably, there is a severe >90% stenosis at the right ICA origin which is similar to the prior CTA of 02/03/2022.  The vertebral arteries are patent in the neck.  Progressive severe stenosis of the left vertebral artery proximal V1 segment.  Aortic atherosclerosis and emphysema. CTA head: Acute occlusion of the proximal M2 left MCA vessel.  Background intracranial atherosclerotic disease with multifocal stenoses, most notably as follows.  Severe near occlusive stenosis of the intracranial left vertebral artery new from prior CTA 02/03/2022.  Severe stenosis of a right posterior cerebral artery  branch at the P3/P4 junction, also new from the prior exam. CT perfusion: The perfusion solver identifies regions of hypoperfusion totaling 117 mL.  These foci are predominantly located within the left MCA vascular territory, although some foci are present within the right cerebral watershed territory.  The perfusion solver identifies no core infarct.  However, an acute infarct was demonstrated on the noncontrast head CT performed earlier today, as described.  The perfusion software reports a mismatch volume of 117 mL.  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 MRI brain: Acute infarct of the left basal ganglia and insula with punctate foci that extend to the subcortical white matter of the left frontal and parietal lobes.  No hemorrhage or mass effect.  Multiple old cerebellar infarcts and findings of chronic small vessel ischemia. 2D Echo there is a large mobile clot in the LV that appears to be attached to the interventricular septum just under the mitral valve.  LVEF <20%.  The left ventricle has severely decreased function with global hypokinesis.  There is a small RV apical clot.  Right ventricular systolic function is severely reduced.  There is a larger mobile clot in the LA.  Right and left atria are mildly dilated. 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  Cardiomyopathy HFrEF Intracardiac thrombi  NSVT overnight  TTE EF < 20% Likely cardiac myopathy Large mobile clot in the LV Small RV apical clot, larger mobile clot in  the LA On heparin drip Hypercoagulable work up ordered, pending  Prescribed home carvedilol, torsemide, Bidil per cardiology Concern for chronic medication non-adherence Cardiology on board  Hypertension Home meds: Carvedilol, torsemide, Bidil  Stable Blood pressure goal: gradual normotension  Hyperlipidemia Home meds:  None LDL 71, goal < 70 Rosuvastatin 5 mg daily added by cardiology High intensity statin not indicated as patient is near goal without current medications Continue statin at discharge  Diabetes type II Controlled Hypoglycemia during admission Home meds:   HgbA1c 5.8, goal < 7.0 CBGs q4h Dextrose amp for hypoglycemia Will need tube feed initiation via NGT if fails swallow study for speech therapy Coretrack placement on Monday 03/07/24 as needed if patient does not pass swallow evaluation  Tobacco Abuse Remote tobacco use reported      Ready to quit? N/A  Substance Abuse Patient has a history of cocaine use UDS ordered Reports last cocaine use 2 days PTA      Ready to quit? N/A TOC consult for cessation as patient is able to participate  Dysphagia Patient has post-stroke dysphagia, SLP consulted    Diet   Diet NPO time specified  Will need Coretrack placement Monday if he is unable to tolerate PO  Attempt NGT today if he does not pass swallow with ST reevaluation   Other Stroke Risk Factors Obesity, Body mass index is 47.74 kg/m., BMI >/= 30 associated with increased stroke risk, recommend weight loss, diet and exercise as appropriate  Questionable alcohol use, level <10 on arrival Coronary artery disease Congestive heart failure  Other Active Problems Schizoaffective disorder, Bipolar 1 disorder, history of SI and suicidal attempts, depression Continue home Abilify COPD Trelegy ellipta at home Home Pulmicort, albuterol Chronic pain On home gabapentin, oxycodone GERD On home protonix Bilateral BKA  Hospital day # 1  Lanae Boast,  AGACNP-BC [facility] Pager: [phone]  I have personally obtained history,examined this patient, reviewed notes, independently viewed imaging studies, participated in medical decision making and plan of care.ROS completed by me personally and pertinent positives fully documented  I have made any additions or clarifications directly to the above note. Agree with note above.  Patient neurological exam remains unchanged with severe dysarthria and right hemiparesis.  He remains on IV heparin for his cardiac thrombi and severe cardiomyopathy.  Patient also has significant dysphagia and awaits speech therapy eval but will likely need NG tube for nutrition and medications.  Switch IV heparin to Eliquis when patient is able to swallow.  No family at the bedside.  Discussed with Dr. Celine Mans critical care medicine.This patient is critically ill and at significant risk of neurological worsening, death and care requires constant monitoring of vital signs, hemodynamics,respiratory and cardiac monitoring, extensive review of multiple databases, frequent neurological assessment, discussion with family, other specialists and medical decision making of high complexity.I have made any additions or clarifications directly to the above note.This critical care time does not reflect procedure time, or teaching time or supervisory time of PA/NP/Med Resident etc but could involve care discussion time.  I spent 30 minutes of neurocritical care time  in the care of  this patient.      Delia Heady, MD Medical Director [facility] Pager: [phone] 03/05/2024 3:33 PM  To contact Stroke Continuity provider, please refer to [email or web address]. After hours, contact General Neurology

## 2024-03-05 NOTE — Plan of Care (Signed)
  Problem: Education: Goal: Knowledge of disease or condition will improve Outcome: Progressing Goal: Knowledge of secondary prevention will improve (MUST DOCUMENT ALL) Outcome: Progressing Goal: Knowledge of patient specific risk factors will improve (DELETE if not current risk factor) Outcome: Progressing   Problem: Ischemic Stroke/TIA Tissue Perfusion: Goal: Complications of ischemic stroke/TIA will be minimized Outcome: Progressing   Problem: Coping: Goal: Will verbalize positive feelings about self Outcome: Progressing Goal: Will identify appropriate support needs Outcome: Progressing   Problem: Health Behavior/Discharge Planning: Goal: Ability to manage health-related needs will improve Outcome: Progressing Goal: Goals will be collaboratively established with patient/family Outcome: Progressing   Problem: Self-Care: Goal: Ability to participate in self-care as condition permits will improve Outcome: Progressing Goal: Verbalization of feelings and concerns over difficulty with self-care will improve Outcome: Progressing Goal: Ability to communicate needs accurately will improve Outcome: Progressing

## 2024-03-06 ENCOUNTER — Encounter (HOSPITAL_COMMUNITY): Payer: MEDICAID

## 2024-03-06 DIAGNOSIS — I429 Cardiomyopathy, unspecified: Secondary | ICD-10-CM | POA: Diagnosis not present

## 2024-03-06 DIAGNOSIS — I639 Cerebral infarction, unspecified: Secondary | ICD-10-CM | POA: Diagnosis not present

## 2024-03-06 DIAGNOSIS — I5022 Chronic systolic (congestive) heart failure: Secondary | ICD-10-CM | POA: Diagnosis not present

## 2024-03-06 DIAGNOSIS — E11649 Type 2 diabetes mellitus with hypoglycemia without coma: Secondary | ICD-10-CM | POA: Diagnosis not present

## 2024-03-06 DIAGNOSIS — I63512 Cerebral infarction due to unspecified occlusion or stenosis of left middle cerebral artery: Secondary | ICD-10-CM | POA: Diagnosis not present

## 2024-03-06 DIAGNOSIS — I63412 Cerebral infarction due to embolism of left middle cerebral artery: Secondary | ICD-10-CM | POA: Diagnosis not present

## 2024-03-06 DIAGNOSIS — I513 Intracardiac thrombosis, not elsewhere classified: Secondary | ICD-10-CM | POA: Diagnosis not present

## 2024-03-06 LAB — LUPUS ANTICOAGULANT PANEL
DRVVT: 35 s (ref 0.0–47.0)
PTT Lupus Anticoagulant: 35.1 s (ref 0.0–43.5)

## 2024-03-06 LAB — GLUCOSE, CAPILLARY
Glucose-Capillary: 103 mg/dL — ABNORMAL HIGH (ref 70–99)
Glucose-Capillary: 110 mg/dL — ABNORMAL HIGH (ref 70–99)
Glucose-Capillary: 160 mg/dL — ABNORMAL HIGH (ref 70–99)
Glucose-Capillary: 163 mg/dL — ABNORMAL HIGH (ref 70–99)
Glucose-Capillary: 75 mg/dL (ref 70–99)
Glucose-Capillary: 82 mg/dL (ref 70–99)
Glucose-Capillary: 88 mg/dL (ref 70–99)

## 2024-03-06 LAB — BETA-2-GLYCOPROTEIN I ABS, IGG/M/A
Beta-2 Glyco I IgG: <9 GPI IgG units (ref 0–20)
Beta-2-Glycoprotein I IgA: <9 GPI IgA units (ref 0–25)
Beta-2-Glycoprotein I IgM: <9 GPI IgM units (ref 0–32)

## 2024-03-06 LAB — PROTEIN C ACTIVITY: Protein C Activity: 43 % — ABNORMAL LOW (ref 73–180)

## 2024-03-06 LAB — PROTEIN S, TOTAL: Protein S Ag, Total: 71 % (ref 60–150)

## 2024-03-06 LAB — PROTEIN S ACTIVITY: Protein S Activity: 53 % — ABNORMAL LOW (ref 63–140)

## 2024-03-06 MED ORDER — LOSARTAN POTASSIUM 25 MG PO TABS
25.0000 mg | ORAL_TABLET | Freq: Every day | ORAL | Status: DC
  Administered 2024-03-06 – 2024-03-09 (×4): 25 mg via ORAL
  Filled 2024-03-06 (×4): qty 1

## 2024-03-06 MED ORDER — OXYCODONE HCL 5 MG PO TABS
5.0000 mg | ORAL_TABLET | Freq: Three times a day (TID) | ORAL | Status: DC | PRN
  Administered 2024-03-06 – 2024-03-11 (×11): 5 mg via ORAL
  Filled 2024-03-06 (×12): qty 1

## 2024-03-06 MED ORDER — ARIPIPRAZOLE 5 MG PO TABS
5.0000 mg | ORAL_TABLET | Freq: Every day | ORAL | Status: DC
  Administered 2024-03-07 – 2024-03-15 (×8): 5 mg via ORAL
  Filled 2024-03-06 (×12): qty 1

## 2024-03-06 MED ORDER — ENOXAPARIN SODIUM 80 MG/0.8ML IJ SOSY
80.0000 mg | PREFILLED_SYRINGE | Freq: Two times a day (BID) | INTRAMUSCULAR | Status: DC
  Administered 2024-03-06 (×2): 80 mg via SUBCUTANEOUS
  Filled 2024-03-06 (×3): qty 0.8

## 2024-03-06 MED ORDER — MORPHINE SULFATE (PF) 2 MG/ML IV SOLN
1.0000 mg | Freq: Once | INTRAVENOUS | Status: AC
  Administered 2024-03-06: 1 mg via INTRAVENOUS
  Filled 2024-03-06: qty 1

## 2024-03-06 MED ORDER — DEXTROSE 10 % IV SOLN: INTRAVENOUS | Status: DC

## 2024-03-06 NOTE — Progress Notes (Addendum)
 PHARMACY - ANTICOAGULATION CONSULT NOTE  Pharmacy Consult for Heparin > Enoxaparin Indication: LV thrombus  Allergies  Allergen Reactions   Acetaminophen Hives, Nausea And Vomiting, Rash and Anaphylaxis    PT states that if he takes this he is going to the hospital   Motrin [Ibuprofen] Anaphylaxis   Nsaids Anaphylaxis    PT states that if he takes this he is going to the hospital   Lyrica Cr [Pregabalin Er] Other (See Comments)    Dystonia Dysarthria    Peanut-Containing Drug Products Other (See Comments)    Pt denies allergy to peanut butter 03/02/2021    Patient Measurements: Height: 4\' 2"  (127 cm) Weight: 77 kg (169 lb 12.1 oz) IBW/kg (Calculated) : 27 HEPARIN DW (KG): 46.7  Vital Signs: Temp: 98.1 F (36.7 C) (04/06 0748) Temp Source: Oral (04/06 0748) BP: 120/97 (04/06 1000) Pulse Rate: 82 (04/06 1000)  Labs: Recent Labs    03/04/24 1436 03/05/24 0609 03/05/24 0700 03/05/24 2006  HGB 14.3  15.0 13.9  --   --   HCT 43.8  44.0 42.3  --   --   PLT 210 229  --   --   APTT 30  --   --   --   LABPROT 18.1*  --   --   --   INR 1.5*  --   --   --   HEPARINUNFRC  --   --  <0.10* <0.10*  CREATININE 1.04  1.00 1.08  --   --     Estimated Creatinine Clearance: 51.4 mL/min (by C-G formula based on SCr of 1.08 mg/dL).  Assessment: 58 yo male presents with acute left MCA CVA. 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 CBC wnl. Not on [facility] prior to admission. Heparin level undetectable, per confirmation with RN, heparin charted as off for small period of time overnight but no significant issues with heparin infusion reported during handoff. Will increase rate conservatively and recheck level in 6 hours.  PM f/u > delay in heparin level due to pt refusal of labs, then difficulty with lab sticks.  Heparin level remains undetectable 4/05 evening.  No known issues with IV infusion, no overt bleeding or complications noted. S/w MD, transitioning to  enoxaparin for therapeutic treatment of LV thrombus.   Goal of Therapy:  Anti-Xa level 0.6-1 units/ml 4hrs after LMWH dose given Monitor platelets by anticoagulation protocol: Yes   Plan:   Stop IV heparin Start enoxaparin 80mg  SQ Q12h Check anti-Xa levels PRN Continue to monitor H&H and platelets F/u transition to oral agent when able  Thank you for allowing pharmacy to be a part of this patient's care.  Thelma Barge, PharmD, BCPS Clinical Pharmacist

## 2024-03-06 NOTE — Evaluation (Signed)
 Occupational Therapy Evaluation Patient Details Name: [name] MRN: [id number] DOB: 1966/04/11 Today's Date: 03/06/2024   History of Present Illness   Patient is 58 y.o. male presented to ED on 03/04/24 with stroke like symptoms when caregiver found him with slurred speech and right sided weakness. Head CT reveals Acute left MCA territory infarct affecting the insula, caudate nucleus, lentiform nucleus and internal capsule. PMH significant of  Schizoaffective disorder, heart failure with reduced ejection fraction, pretension, DMII, COPD, s/p bilateral amputations, chronic pain, and polysubstance abuse.     Clinical Impressions Prior to this admission, patient was mod I at wheelchair level. Patient is known to the therapy department as patient was here for a number of days prior to finding an appropriate discharge venue. Currently, patient lethargic after having a BM requiring increased assist from RNs for peri-care. Patient also with difficulty expressing self due to slurred speech. Patient currently max A for ADL managment, with flaccid RUE. Patient unable to transition to EOB safely due to level of arousal. OT recommending rehab at a lesser intensive venue < 3 hours at discharge. OT will continue to follow acutely.      If plan is discharge home, recommend the following:   Two people to help with walking and/or transfers;Two people to help with bathing/dressing/bathroom;Assistance with cooking/housework;Direct supervision/assist for medications management;Direct supervision/assist for financial management;Assist for transportation;Help with stairs or ramp for entrance;Supervision due to cognitive status     Functional Status Assessment   Patient has had a recent decline in their functional status and demonstrates the ability to make significant improvements in function in a reasonable and predictable amount of time.     Equipment Recommendations   Other (comment) (defer to next  venue)     Recommendations for Other Services         Precautions/Restrictions   Precautions Precautions: Fall Recall of Precautions/Restrictions: Impaired Restrictions Weight Bearing Restrictions Per Provider Order: No     Mobility Bed Mobility Overal bed mobility: Needs Assistance Bed Mobility: Rolling Rolling: Min assist, Max assist         General bed mobility comments: Can roll to R without effort, increased assist need to roll to L due to hemiparesis    Transfers                   General transfer comment: unable to progress to due level of arousal      Balance Overall balance assessment: Mild deficits observed, not formally tested                                         ADL either performed or assessed with clinical judgement   ADL Overall ADL's : Needs assistance/impaired Eating/Feeding: Moderate assistance;Bed level   Grooming: Moderate assistance;Bed level   Upper Body Bathing: Maximal assistance;Bed level   Lower Body Bathing: Maximal assistance;Moderate assistance;Bed level   Upper Body Dressing : Maximal assistance   Lower Body Dressing: Maximal assistance;Bed level   Toilet Transfer: Maximal assistance;Total assistance;+2 for safety/equipment;+2 for physical assistance Toilet Transfer Details (indicate cue type and reason): bed level Toileting- Clothing Manipulation and Hygiene: +2 for physical assistance;Total assistance;+2 for safety/equipment;Bed level       Functional mobility during ADLs: Maximal assistance;+2 for safety/equipment;+2 for physical assistance General ADL Comments: Prior to this admission, patient was mod I at wheelchair level. Patient is known to the therapy department as patient  was here for a number of days prior to finding an appropriate discharge venue. Currently, patient lethargic after having a BM requiring increased assist from RNs for peri-care. Patient also with difficulty expressing  self due to slurred speech. Patient currently max A for ADL managment, with flaccid RUE. Patient unable to transition to EOB safely due to level of arousal. OT recommending rehab at a lesser intensive venue < 3 hours at discharge. OT will continue to follow acutely.     Vision   Additional Comments: will continue to assess, not alert enough to complete     Perception Perception: Not tested       Praxis Praxis: Not tested       Pertinent Vitals/Pain Pain Assessment Pain Assessment: Faces Pain Score: 0-No pain Faces Pain Scale: No hurt Pain Intervention(s): Limited activity within patient's tolerance, Monitored during session, Repositioned     Extremity/Trunk Assessment Upper Extremity Assessment Upper Extremity Assessment: Right hand dominant;RUE deficits/detail RUE Deficits / Details: flaccid RUE RUE Coordination: decreased fine motor;decreased gross motor   Lower Extremity Assessment Lower Extremity Assessment: Defer to PT evaluation       Communication Communication Communication: Impaired Factors Affecting Communication: Difficulty expressing self;Reduced clarity of speech   Cognition Arousal: Lethargic Behavior During Therapy: WFL for tasks assessed/performed, Flat affect Cognition: Cognition impaired, Difficult to assess Difficult to assess due to: Impaired communication Orientation impairments: Place, Time, Situation Awareness: Intellectual awareness impaired, Online awareness impaired Memory impairment (select all impairments): Short-term memory, Working memory Attention impairment (select first level of impairment): Focused attention Executive functioning impairment (select all impairments): Initiation, Organization, Sequencing, Reasoning, Problem solving OT - Cognition Comments: Patient lethargic upon entry, unable to fully respond to line of questioning despite increased alert state when repositioned, STM deficits noted                 Following  commands: Impaired Following commands impaired: Follows one step commands inconsistently, Follows one step commands with increased time     Cueing  General Comments   Cueing Techniques: Verbal cues;Tactile cues      Exercises     Shoulder Instructions      Home Living Family/patient expects to be discharged to:: Private residence Living Arrangements: Alone   Type of Home: Apartment                           Additional Comments: Information regarding home living and PLOF obtained through chart review as pt lethargic and not following conversation to answer questions fully.      Prior Functioning/Environment Prior Level of Function : Needs assist;Independent/Modified Independent             Mobility Comments: Per chart review, pt often requires assistance for functional mobility from w/c level due to difficulty with self-propulsion following prior CVA. Per chart review, pt has L LE prosthesis and does not have prosthesis on the right.. ADLs Comments: Per chart review, pt completes ADLs Independent to Mod I from w/c level.    OT Problem List: Decreased strength;Decreased activity tolerance;Impaired balance (sitting and/or standing);Decreased range of motion;Decreased coordination;Decreased cognition;Decreased safety awareness;Decreased knowledge of use of DME or AE;Decreased knowledge of precautions;Impaired sensation;Impaired UE functional use   OT Treatment/Interventions: Self-care/ADL training;Therapeutic exercise;Neuromuscular education;Energy conservation;DME and/or AE instruction;Manual therapy;Splinting;Therapeutic activities;Cognitive remediation/compensation;Patient/family education;Balance training;Visual/perceptual remediation/compensation      OT Goals(Current goals can be found in the care plan section)   Acute Rehab OT Goals Patient Stated Goal: unable OT Goal  Formulation: Patient unable to participate in goal setting Time For Goal Achievement:  03/20/24 Potential to Achieve Goals: Fair ADL Goals Pt Will Perform Lower Body Bathing: with mod assist;sitting/lateral leans Pt Will Perform Lower Body Dressing: with mod assist;sitting/lateral leans Pt Will Transfer to Toilet: with mod assist;with transfer board;bedside commode Pt Will Perform Toileting - Clothing Manipulation and hygiene: with mod assist;sitting/lateral leans Additional ADL Goal #1: Patient will be able to follow 1-2 step commands consistently in order to progress with education post stroke to promote increased independence. Additional ADL Goal #2: Patient will be able to sit EOB with mod A for 5 minutes as a precursor to OOB activities.   OT Frequency:  Min 1X/week    Co-evaluation              AM-PAC OT "6 Clicks" Daily Activity     Outcome Measure Help from another person eating meals?: A Lot Help from another person taking care of personal grooming?: A Lot Help from another person toileting, which includes using toliet, bedpan, or urinal?: Total Help from another person bathing (including washing, rinsing, drying)?: A Lot Help from another person to put on and taking off regular upper body clothing?: A Lot Help from another person to put on and taking off regular lower body clothing?: A Lot 6 Click Score: 11   End of Session Nurse Communication: Mobility status  Activity Tolerance: Patient limited by fatigue;Patient limited by lethargy Patient left: in bed;with call bell/phone within reach;with bed alarm set  OT Visit Diagnosis: Unsteadiness on feet (R26.81);Other abnormalities of gait and mobility (R26.89);Muscle weakness (generalized) (M62.81);Other symptoms and signs involving the nervous system (R29.898);Other symptoms and signs involving cognitive function;Hemiplegia and hemiparesis Hemiplegia - Right/Left: Right Hemiplegia - dominant/non-dominant: Dominant Hemiplegia - caused by: Cerebral infarction                Time: 0981-1914 OT Time  Calculation (min): 13 min Charges:  OT General Charges $OT Visit: 1 Visit OT Evaluation $OT Eval Moderate Complexity: 1 Mod  Pollyann Glen E. Sascha Baugher, OTR/L Acute Rehabilitation Services [phone]   Cherlyn Cushing 03/06/2024, 4:51 PM

## 2024-03-06 NOTE — Progress Notes (Signed)
 DAILY PROGRESS NOTE   Patient Name: [name] Date of Encounter: 03/06/2024 Cardiologist: Rollene Rotunda, MD  Chief Complaint   No complaints  Patient Profile   [name] is a 58 y.o. male with a hx of cardiomyopathy, CAD, CVA, type II diabetes, amputation who is being seen 03/04/2024 for the evaluation of LV thrombus at the request of Dr. Artis Flock   Subjective   Much better today- did well with swallow eval. Speech has improved, still with dense RUE weakness and moderate RLE weakness. More alert, following commands. Able to tolerate low dose carvedilol - still with PVC's and less frequent NSVT overnight.  Objective   Vitals:   03/06/24 0600 03/06/24 0747 03/06/24 0748 03/06/24 0800  BP: (!) 138/97   (!) 131/94  Pulse: 76 71  72  Resp: (!) 22 12  (!) 22  Temp:   98.1 F (36.7 C)   TempSrc:   Oral   SpO2: 100% 98% 100% 100%  Weight:      Height:        Intake/Output Summary (Last 24 hours) at 03/06/2024 0857 Last data filed at 03/06/2024 0800 Gross per 24 hour  Intake 1144.92 ml  Output 450 ml  Net 694.92 ml   Filed Weights   03/04/24 1550 03/06/24 0500  Weight: 77 kg 77 kg    Physical Exam   General appearance: alert and mild distress Lungs: clear to auscultation bilaterally Heart: regular rate and rhythm Extremities: bilateral BKA Neurologic: Mental status: awake, slurred speech  Inpatient Medications    Scheduled Meds:  arformoterol  15 mcg Nebulization BID   budesonide (PULMICORT) nebulizer solution  0.5 mg Nebulization BID   carvedilol  3.125 mg Oral BID WC   Chlorhexidine Gluconate Cloth  6 each Topical Daily   dextrose  12.5 g Intravenous STAT   nicotine  14 mg Transdermal Daily   pantoprazole (PROTONIX) IV  40 mg Intravenous Q24H   revefenacin  175 mcg Nebulization Daily   rosuvastatin  5 mg Oral QHS    Continuous Infusions:  dextrose 30 mL/hr at 03/06/24 0800   heparin 1,000 Units/hr (03/06/24 0800)    PRN Meds: albuterol,  metoprolol tartrate   Labs   Results for orders placed or performed during the hospital encounter of 03/04/24 (from the past 48 hours)  CBG monitoring, ED     Status: Abnormal   Collection Time: 03/04/24  2:30 PM  Result Value Ref Range   Glucose-Capillary 138 (H) 70 - 99 mg/dL    Comment: Glucose reference range applies only to samples taken after fasting for at least 8 hours.  I-stat chem 8, ED     Status: Abnormal   Collection Time: 03/04/24  2:36 PM  Result Value Ref Range   Sodium 141 135 - 145 mmol/L   Potassium 3.6 3.5 - 5.1 mmol/L   Chloride 109 98 - 111 mmol/L   BUN 19 6 - 20 mg/dL   Creatinine, Ser 1.61 0.61 - 1.24 mg/dL   Glucose, Bld 096 (H) 70 - 99 mg/dL    Comment: Glucose reference range applies only to samples taken after fasting for at least 8 hours.   Calcium, Ion 0.92 (L) 1.15 - 1.40 mmol/L   TCO2 20 (L) 22 - 32 mmol/L   Hemoglobin 15.0 13.0 - 17.0 g/dL   HCT 04.5 40.9 - 81.1 %  Ethanol     Status: None   Collection Time: 03/04/24  2:36 PM  Result Value Ref Range  Alcohol, Ethyl (B) <10 <10 mg/dL    Comment: (NOTE) Lowest detectable limit for serum alcohol is 10 mg/dL.  For medical purposes only. Performed at [facility], [address] [place], [name] [address]   Protime-INR     Status: Abnormal   Collection Time: 03/04/24  2:36 PM  Result Value Ref Range   Prothrombin Time 18.1 (H) 11.4 - 15.2 seconds   INR 1.5 (H) 0.8 - 1.2    Comment: (NOTE) INR goal varies based on device and disease states. Performed at [facility], [address] [place], [name] [address]   APTT     Status: None   Collection Time: 03/04/24  2:36 PM  Result Value Ref Range   aPTT 30 24 - 36 seconds    Comment: Performed at [facility], [address], [place], [name] [address]  CBC     Status: Abnormal   Collection Time: 03/04/24  2:36 PM  Result Value Ref Range   WBC 6.6 4.0 - 10.5 K/uL   RBC 4.67 4.22 - 5.81 MIL/uL   Hemoglobin 14.3 13.0 -  17.0 g/dL   HCT 91.4 78.2 - 95.6 %   MCV 93.8 80.0 - 100.0 fL   MCH 30.6 26.0 - 34.0 pg   MCHC 32.6 30.0 - 36.0 g/dL   RDW 21.3 (H) 08.6 - 57.8 %   Platelets 210 150 - 400 K/uL   nRBC 0.0 0.0 - 0.2 %    Comment: Performed at [facility], [address] [place], [name] [address]  Differential     Status: None   Collection Time: 03/04/24  2:36 PM  Result Value Ref Range   Neutrophils Relative % 64 %   Neutro Abs 4.2 1.7 - 7.7 K/uL   Lymphocytes Relative 24 %   Lymphs Abs 1.6 0.7 - 4.0 K/uL   Monocytes Relative 11 %   Monocytes Absolute 0.7 0.1 - 1.0 K/uL   Eosinophils Relative 1 %   Eosinophils Absolute 0.0 0.0 - 0.5 K/uL   Basophils Relative 0 %   Basophils Absolute 0.0 0.0 - 0.1 K/uL   Immature Granulocytes 0 %   Abs Immature Granulocytes 0.02 0.00 - 0.07 K/uL    Comment: Performed at [facility], [address], [place], [name] [address]  Comprehensive metabolic panel     Status: Abnormal   Collection Time: 03/04/24  2:36 PM  Result Value Ref Range   Sodium 138 135 - 145 mmol/L   Potassium 3.7 3.5 - 5.1 mmol/L    Comment: HEMOLYSIS AT THIS LEVEL MAY AFFECT RESULT   Chloride 108 98 - 111 mmol/L   CO2 19 (L) 22 - 32 mmol/L   Glucose, Bld 136 (H) 70 - 99 mg/dL    Comment: Glucose reference range applies only to samples taken after fasting for at least 8 hours.   BUN 17 6 - 20 mg/dL   Creatinine, Ser 1.32 0.61 - 1.24 mg/dL   Calcium 7.9 (L) 8.9 - 10.3 mg/dL   Total Protein 5.7 (L) 6.5 - 8.1 g/dL   Albumin 2.9 (L) 3.5 - 5.0 g/dL   AST 59 (H) 15 - 41 U/L    Comment: HEMOLYSIS AT THIS LEVEL MAY AFFECT RESULT   ALT 87 (H) 0 - 44 U/L    Comment: HEMOLYSIS AT THIS LEVEL MAY AFFECT RESULT   Alkaline Phosphatase 67 38 - 126 U/L   Total Bilirubin 2.4 (H) 0.0 -  1.2 mg/dL    Comment: HEMOLYSIS AT THIS LEVEL MAY AFFECT RESULT   GFR, Estimated >60 >60 mL/min    Comment: (NOTE) Calculated using the CKD-EPI Creatinine Equation (2021)    Anion gap 11 5 - 15     Comment: Performed at [facility], [address] [place], [name] [address]  Magnesium     Status: Abnormal   Collection Time: 03/04/24  4:21 PM  Result Value Ref Range   Magnesium 1.3 (L) 1.7 - 2.4 mg/dL    Comment: HEMOLYSIS AT THIS LEVEL MAY AFFECT RESULT Performed at [facility], [address], [place], [name] [address]   MRSA Next Gen by PCR, Nasal     Status: None   Collection Time: 03/04/24 11:10 PM   Specimen: Nasal Mucosa; Nasal Swab  Result Value Ref Range   MRSA by PCR Next Gen NOT DETECTED NOT DETECTED    Comment: (NOTE) The GeneXpert MRSA Assay (FDA approved for NASAL specimens only), is one component of a comprehensive MRSA colonization surveillance program. It is not intended to diagnose MRSA infection nor to guide or monitor treatment for MRSA infections. Test performance is not FDA approved in patients less than 67 years old. Performed at [facility], [address], [place], [name] [address]   Lipid panel     Status: Abnormal   Collection Time: 03/05/24  6:09 AM  Result Value Ref Range   Cholesterol 110 0 - 200 mg/dL   Triglycerides 51 <846 mg/dL   HDL 29 (L) >96 mg/dL   Total CHOL/HDL Ratio 3.8 RATIO   VLDL 10 0 - 40 mg/dL   LDL Cholesterol 71 0 - 99 mg/dL    Comment:        Total Cholesterol/HDL:CHD Risk Coronary Heart Disease Risk Table                     Men   Women  1/2 Average Risk   3.4   3.3  Average Risk       5.0   4.4  2 X Average Risk   9.6   7.1  3 X Average Risk  23.4   11.0        Use the calculated Patient Ratio above and the CHD Risk Table to determine the patient's CHD Risk.        ATP III CLASSIFICATION (LDL):  <100     mg/dL   Optimal  295-284  mg/dL   Near or Above                    Optimal  130-159  mg/dL   Borderline  132-440  mg/dL   High  >102     mg/dL   Very High Performed at [facility], [address], [place], [name] [address]   Basic metabolic panel     Status: Abnormal    Collection Time: 03/05/24  6:09 AM  Result Value Ref Range   Sodium 142 135 - 145 mmol/L   Potassium 3.7 3.5 - 5.1 mmol/L   Chloride 112 (H) 98 - 111 mmol/L   CO2 19 (L) 22 - 32 mmol/L   Glucose, Bld 74 70 - 99 mg/dL    Comment: Glucose reference range applies only to samples taken after fasting for at least 8 hours.   BUN 13 6 - 20 mg/dL   Creatinine, Ser 6.44 0.61 - 1.24 mg/dL   Calcium 8.3 (  L) 8.9 - 10.3 mg/dL   GFR, Estimated >14 >78 mL/min    Comment: (NOTE) Calculated using the CKD-EPI Creatinine Equation (2021)    Anion gap 11 5 - 15    Comment: Performed at [facility], [address], [place], [name] [address]  CBC     Status: Abnormal   Collection Time: 03/05/24  6:09 AM  Result Value Ref Range   WBC 6.4 4.0 - 10.5 K/uL   RBC 4.53 4.22 - 5.81 MIL/uL   Hemoglobin 13.9 13.0 - 17.0 g/dL   HCT 13.0 86.5 - 78.4 %   MCV 93.4 80.0 - 100.0 fL   MCH 30.7 26.0 - 34.0 pg   MCHC 32.9 30.0 - 36.0 g/dL   RDW 69.6 (H) 29.5 - 28.4 %   Platelets 229 150 - 400 K/uL   nRBC 0.0 0.0 - 0.2 %    Comment: Performed at [facility], [address], [place], [name] [address]  Antithrombin III     Status: Abnormal   Collection Time: 03/05/24  6:09 AM  Result Value Ref Range   AntiThromb III Func 70 (L) 75 - 120 %    Comment: Performed at [facility], [address], [place], [name] [address]  Heparin level (unfractionated)     Status: Abnormal   Collection Time: 03/05/24  7:00 AM  Result Value Ref Range   Heparin Unfractionated <0.10 (L) 0.30 - 0.70 IU/mL    Comment: (NOTE) The clinical reportable range upper limit is being lowered to >1.10 to align with the FDA approved guidance for the current laboratory assay.  If heparin results are below expected values, and patient dosage has  been confirmed, suggest follow up testing of antithrombin III levels. Performed at [facility], [address] [place], [name] [address]   Glucose, capillary     Status:  Abnormal   Collection Time: 03/05/24  7:27 AM  Result Value Ref Range   Glucose-Capillary 57 (L) 70 - 99 mg/dL    Comment: Glucose reference range applies only to samples taken after fasting for at least 8 hours.  Glucose, capillary     Status: Abnormal   Collection Time: 03/05/24  8:27 AM  Result Value Ref Range   Glucose-Capillary 125 (H) 70 - 99 mg/dL    Comment: Glucose reference range applies only to samples taken after fasting for at least 8 hours.  Glucose, capillary     Status: Abnormal   Collection Time: 03/05/24 11:11 AM  Result Value Ref Range   Glucose-Capillary 65 (L) 70 - 99 mg/dL    Comment: Glucose reference range applies only to samples taken after fasting for at least 8 hours.  Urine rapid drug screen (hosp performed)     Status: Abnormal   Collection Time: 03/05/24 11:21 AM  Result Value Ref Range   Opiates POSITIVE (A) NONE DETECTED   Cocaine POSITIVE (A) NONE DETECTED   Benzodiazepines POSITIVE (A) NONE DETECTED   Amphetamines NONE DETECTED NONE DETECTED   Tetrahydrocannabinol NONE DETECTED NONE DETECTED   Barbiturates NONE DETECTED NONE DETECTED    Comment: (NOTE) DRUG SCREEN FOR MEDICAL PURPOSES ONLY.  IF CONFIRMATION IS NEEDED FOR ANY PURPOSE, NOTIFY LAB WITHIN 5 DAYS.  LOWEST DETECTABLE LIMITS FOR URINE DRUG SCREEN Drug Class                     Cutoff (ng/mL) Amphetamine and metabolites    1000 Barbiturate and metabolites  200 Benzodiazepine                 200 Opiates and metabolites        300 Cocaine and metabolites        300 THC                            50 Performed at [facility], [address], [place], [name] [address]   Urinalysis, Routine w reflex microscopic -Urine, Clean Catch     Status: Abnormal   Collection Time: 03/05/24 11:22 AM  Result Value Ref Range   Color, Urine AMBER (A) YELLOW    Comment: BIOCHEMICALS MAY BE AFFECTED BY COLOR   APPearance HAZY (A) CLEAR   Specific Gravity, Urine >1.046 (H) 1.005 - 1.030    pH 5.0 5.0 - 8.0   Glucose, UA NEGATIVE NEGATIVE mg/dL   Hgb urine dipstick NEGATIVE NEGATIVE   Bilirubin Urine NEGATIVE NEGATIVE   Ketones, ur 5 (A) NEGATIVE mg/dL   Protein, ur 30 (A) NEGATIVE mg/dL   Nitrite NEGATIVE NEGATIVE   Leukocytes,Ua NEGATIVE NEGATIVE   RBC / HPF 0-5 0 - 5 RBC/hpf   WBC, UA 0-5 0 - 5 WBC/hpf   Bacteria, UA NONE SEEN NONE SEEN   Squamous Epithelial / HPF 0-5 0 - 5 /HPF   Mucus PRESENT    Ca Oxalate Crys, UA PRESENT     Comment: Performed at [facility], [address] [place], [name] [address]  Glucose, capillary     Status: Abnormal   Collection Time: 03/05/24  3:42 PM  Result Value Ref Range   Glucose-Capillary 69 (L) 70 - 99 mg/dL    Comment: Glucose reference range applies only to samples taken after fasting for at least 8 hours.  Heparin level (unfractionated)     Status: Abnormal   Collection Time: 03/05/24  8:06 PM  Result Value Ref Range   Heparin Unfractionated <0.10 (L) 0.30 - 0.70 IU/mL    Comment: (NOTE) The clinical reportable range upper limit is being lowered to >1.10 to align with the FDA approved guidance for the current laboratory assay.  If heparin results are below expected values, and patient dosage has  been confirmed, suggest follow up testing of antithrombin III levels. Performed at [facility], [address], [place], [name] [address]   Glucose, capillary     Status: None   Collection Time: 03/05/24  8:06 PM  Result Value Ref Range   Glucose-Capillary 89 70 - 99 mg/dL    Comment: Glucose reference range applies only to samples taken after fasting for at least 8 hours.  Glucose, capillary     Status: None   Collection Time: 03/06/24 12:12 AM  Result Value Ref Range   Glucose-Capillary 82 70 - 99 mg/dL    Comment: Glucose reference range applies only to samples taken after fasting for at least 8 hours.  Glucose, capillary     Status: None   Collection Time: 03/06/24  3:40 AM  Result Value Ref Range    Glucose-Capillary 88 70 - 99 mg/dL    Comment: Glucose reference range applies only to samples taken after fasting for at least 8 hours.  Glucose, capillary     Status: None   Collection Time: 03/06/24  7:28 AM  Result Value Ref Range   Glucose-Capillary 75 70 - 99 mg/dL    Comment: Glucose reference range applies only  to samples taken after fasting for at least 8 hours.    ECG   N/A  Telemetry   Sinus tachycardia with recurrent short bursts of NSVT overnight - Personally Reviewed  Radiology    MR BRAIN WO CONTRAST Result Date: 03/04/2024 CLINICAL DATA:  Stroke follow-up EXAM: MRI HEAD WITHOUT CONTRAST TECHNIQUE: Multiplanar, multiecho pulse sequences of the brain and surrounding structures were obtained without intravenous contrast. COMPARISON:  03/05/2019 and 01/29/2022 FINDINGS: Brain: Area of abnormal diffusion restriction affecting the left basal ganglia and insula with punctate foci that extend to the subcortical white matter of the left frontal and parietal lobes. Chronic microhemorrhage in the left centrum semiovale. There is multifocal hyperintense T2-weighted signal within the white matter. Parenchymal volume and CSF spaces are normal. The midline structures are normal. Multiple old cerebellar infarcts. Vascular: Normal flow voids. Skull and upper cervical spine: Normal calvarium and skull base. Visualized upper cervical spine and soft tissues are normal. Sinuses/Orbits:No paranasal sinus fluid levels or advanced mucosal thickening. No mastoid or middle ear effusion. Normal orbits. IMPRESSION: 1. Acute infarct of the left basal ganglia and insula with punctate foci that extend to the subcortical white matter of the left frontal and parietal lobes. No hemorrhage or mass effect. 2. Multiple old cerebellar infarcts and findings of chronic small vessel ischemia. Electronically Signed   By: Deatra Robinson M.D.   On: 03/04/2024 19:45   ECHOCARDIOGRAM LIMITED Result Date: 03/04/2024     ECHOCARDIOGRAM REPORT   Patient Name:   [name] Date of Exam: 03/04/2024 Medical Rec #:  [id number]      Height:       50.0 in Accession #:    [id number]     Weight:       169.8 lb Date of Birth:  05-09-1966      BSA:          1.525 m Patient Age:    57 years       BP:           171/102 mmHg Patient Gender: M              HR:           76 bpm. Exam Location:  Inpatient Procedure: Limited Echo and Color Doppler (Both Spectral and Color Flow Doppler            were utilized during procedure). Indications:    stroke  History:        Patient has prior history of Echocardiogram examinations, most                 recent 01/14/2024.  Sonographer:    Delcie Roch RDCS Referring Phys: [id number] DENISE A WOLFE IMPRESSIONS  1. The apex has prominen trabecualtions. There is a large mobile clot in the LV that appears to be attached to the interventricular septum just under the mitral valve. . Left ventricular ejection fraction, by estimation, is <20%. The left ventricle has severely decreased function. The left ventricle demonstrates global hypokinesis. Left ventricular diastolic function could not be evaluated.  2. There is a small RV apical clot. Right ventricular systolic function is severely reduced. The right ventricular size is normal.  3. There is a largr mobile clot in the LA. Left atrial size was mildly dilated.  4. Right atrial size was mildly dilated.  5. The mitral valve is normal in structure. Trivial mitral valve regurgitation. No evidence of mitral stenosis.  6. The aortic valve is normal in structure. Aortic  valve regurgitation is not visualized. No aortic stenosis is present. Conclusion(s)/Recommendation(s): Severe biventricular dysfunction with large mobile clots in the LA, RV and LV as described above. FINDINGS  Left Ventricle: The apex has prominen trabecualtions. There is a large mobile clot in the LV that appears to be attached to the interventricular septum just under the mitral valve. Left ventricular  ejection fraction, by estimation, is <20%. The left ventricle has severely decreased function. The left ventricle demonstrates global hypokinesis. The left ventricular internal cavity size was normal in size. There is no left ventricular hypertrophy. Left ventricular diastolic function could not be evaluated. Right Ventricle: There is a small RV apical clot. The right ventricular size is normal. No increase in right ventricular wall thickness. Right ventricular systolic function is severely reduced. Left Atrium: There is a largr mobile clot in the LA. Left atrial size was mildly dilated. Right Atrium: Right atrial size was mildly dilated. Pericardium: There is no evidence of pericardial effusion. Mitral Valve: The mitral valve is normal in structure. Trivial mitral valve regurgitation. No evidence of mitral valve stenosis. Tricuspid Valve: The tricuspid valve is normal in structure. Tricuspid valve regurgitation is trivial. No evidence of tricuspid stenosis. Aortic Valve: The aortic valve is normal in structure. Aortic valve regurgitation is not visualized. No aortic stenosis is present. Pulmonic Valve: The pulmonic valve was not well visualized. Pulmonic valve regurgitation is not visualized. No evidence of pulmonic stenosis. Aorta: The aortic root is normal in size and structure. Venous: The inferior vena cava was not well visualized. IAS/Shunts: No atrial level shunt detected by color flow Doppler. Arvilla Meres MD Electronically signed by Arvilla Meres MD Signature Date/Time: 03/04/2024/4:59:08 PM    Final    CT ANGIO HEAD NECK W WO CM W PERF (CODE STROKE) Result Date: 03/04/2024 CLINICAL DATA:  Provided history: Neuro deficit, acute, stroke suspected. EXAM: CT ANGIOGRAPHY HEAD AND NECK CT PERFUSION BRAIN TECHNIQUE: Multidetector CT imaging of the head and neck was performed using the standard protocol during bolus administration of intravenous contrast. Multiplanar CT image reconstructions and MIPs were  obtained to evaluate the vascular anatomy. Carotid stenosis measurements (when applicable) are obtained utilizing NASCET criteria, using the distal internal carotid diameter as the denominator. Multiphase CT imaging of the brain was performed following IV bolus contrast injection. Subsequent parametric perfusion maps were calculated using RAPID software. RADIATION DOSE REDUCTION: This exam was performed according to the departmental dose-optimization program which includes automated exposure control, adjustment of the mA and/or kV according to patient size and/or use of iterative reconstruction technique. CONTRAST:  Administered contrast not known at this time. COMPARISON:  Non-contrast head CT performed earlier today 03/04/2024. CT angiogram head/neck 02/03/2022. FINDINGS: CTA NECK FINDINGS Aortic arch: Standard aortic branching. A sclerotic plaque within the visualized thoracic aorta and proximal major branch vessels of the neck. No hemodynamically significant innominate or proximal subclavian artery stenosis. Right carotid system: CCA and ICA patent within the neck. Bulky predominately calcified aspect plaque about the carotid bifurcation with severe (greater than 90%) short-segment stenosis at the ICA origin. Noncalcified atherosclerotic plaque elsewhere within the mid and distal CCA resulting in less than 50% stenosis. Partially retropharyngeal course of the cervical ICA. Left carotid system: CCA and ICA patent within the neck without hemodynamically significant stenosis (50% or greater). Mild sclerotic plaque within the mid CCA. Moderate sclerotic plaque about the carotid bifurcation and within the proximal ICA. Vertebral arteries: Vertebral arteries patent within the neck. Progressive severe stenosis of the left vertebral artery proximal V1  segment. Skeleton: The patient is edentulous. Cervical spondylosis. No acute fracture or aggressive osseous lesion. Other neck: No neck mass or cervical lymphadenopathy.  Upper chest: No consolidation within the imaged lung apices. Centrilobular emphysema. Review of the MIP images confirms the above findings CTA HEAD FINDINGS Anterior circulation: The intracranial internal carotid arteries are patent. Atherosclerotic plaque within both vessels with no more than mild stenosis. The M1 middle cerebral arteries are patent. A proximal M2 left middle cerebral artery vessel is occluded at its origin, new from the CTA 02/03/2022 (for instance as seen on series 11, image 25). No right M2 proximal branch occlusion or high-grade proximal stenosis is identified. The anterior cerebral arteries are patent. Developmentally hypoplastic or absent right A1 segment. No intracranial aneurysm is identified. Posterior circulation: The intracranial vertebral arteries are patent. Severe near occlusive stenoses of the left vertebral artery proximal to, at the level of, and distal to the PICA origin. These stenoses are new from the prior CTA of 02/03/2022. The basilar artery is patent. The posterior cerebral arteries are patent. Severe stenosis within a right PCA branch at the P3/P4 junction, new from the prior CTA. Posterior communicating arteries are hypoplastic or absent, bilaterally. Venous sinuses: Within the limitations of contrast timing, no convincing thrombus. Anatomic variants: As described. Review of the MIP images confirms the above findings CT Brain Perfusion Findings: CBF (<30%) Volume: 0mL Perfusion (Tmax>6.0s) volume: (predominately within the left MCA vascular territory, although with some additional foci in the right cerebral watershed territory). Mismatch Volume: Infarction Location: None identified. However, an acute infarct was demonstrated within the left MCA territory on the non-contrast head CT performed earlier today. CTA head impression #1 called by telephone at the time of interpretation on 03/04/2024 at 3:26 pm to provider [facility] , who verbally acknowledged  these results. IMPRESSION: CTA neck: 1. The common carotid and internal carotid arteries are patent with neck. Atherosclerotic plaque bilaterally, as described. Most notably, there is a severe (greater than 90%) stenosis at the right ICA origin which is similar to the prior CTA of 02/03/2022. 2. The vertebral arteries are patent in the neck. Progressive severe stenosis of the left vertebral artery proximal V1 segment. 3. Aortic Atherosclerosis (ICD10-I70.0) and Emphysema (ICD10-J43.9). CTA head: 1. Acute occlusion of a proximal M2 left middle cerebral artery vessel. 2. Background intracranial atherosclerotic disease with multifocal stenoses, most notably as follows. 3. 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 4. Severe stenosis of a right posterior cerebral artery branch at the P3/P4 junction, also new from the prior exam. CT perfusion head: The perfusion software identifies regions of hypoperfusion totaling 117 mL (utilizing the Tmax>6 seconds threshold). These foci are predominantly located within the left MCA vascular territory, although some foci are present within the right cerebral watershed territory. The perfusion software identifies no core infarct. However, an acute infarct was demonstrated on the non-contrast head CT performed earlier today, as described. The perfusion software reports a mismatch volume of 117 mL. 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 Electronically Signed   By: Jackey Loge D.O.   On: 03/04/2024 15:45   CT HEAD CODE STROKE WO CONTRAST Result Date: 03/04/2024 CLINICAL DATA:  Code stroke. Neuro deficit, acute, stroke suspected. EXAM: CT HEAD WITHOUT CONTRAST TECHNIQUE: Contiguous axial images were obtained from the base of the skull through the vertex  without intravenous contrast. RADIATION DOSE  REDUCTION: This exam was performed according to the departmental dose-optimization program which includes automated exposure control, adjustment of the mA and/or kV according to patient size and/or use of iterative reconstruction technique. COMPARISON:  Non-contrast head CT, CT angiogram head/neck and CT venogram head 02/03/2022. FINDINGS: Brain: Mild generalized parenchymal atrophy. Acute left MCA territory infarct affecting the left insula, left caudate nucleus, left lentiform nucleus and left internal capsule. Background moderate patchy and ill-defined hypoattenuation within cerebral white matter, nonspecific but compatible with chronic small vessel ischemic disease. 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 There is no acute intracranial hemorrhage. No extra-axial fluid collection. No evidence of an intracranial mass. No midline shift. Vascular: Asymmetric hyperdensity of the M1/proximal M2 left middle cerebral artery suspicious for the presence of thrombus. Atherosclerotic calcifications. Skull: No calvarial fracture or aggressive osseous lesion. Sinuses/Orbits: No mass or acute finding within the imaged orbits. Chronic medially displaced fracture deformity of the left lamina papyracea. No significant paranasal sinus disease at the imaged levels. ASPECTS (Alberta Stroke Program Early CT Score) - Ganglionic level infarction (caudate, lentiform nuclei, internal capsule, insula, M1-M3 cortex): 3 - Supraganglionic infarction (M4-M6 cortex): 3 Total score (0-10 with 10 being normal): 6 Impressions #1 and #2 called by telephone at the time of interpretation on 03/04/2024 at 2:56 pm to provider Dr. Armanda Magic, who verbally acknowledged these results. IMPRESSION: 1. Acute left MCA territory infarct affecting the insula, caudate nucleus, lentiform nucleus and internal capsule. ASPECTS is 6. 2. Asymmetric hyperdensity of the M1/proximal M2 left middle  cerebral artery suspicious for the presence of thrombus. A CTA of the head/neck is recommended for further evaluation. 3. Background parenchymal atrophy, chronic small vessel ischemic disease and chronic infarcts, as described. Electronically Signed   By: Jackey Loge D.O.   On: 03/04/2024 14:58    Cardiac Studies   See echo above  Assessment   Principal Problem:   Acute CVA (cerebrovascular accident) [facility]) Active Problems:   Schizoaffective disorder, bipolar type (HCC)   Cocaine abuse (HCC)   Type 2 diabetes mellitus (HCC)   Prolonged QT interval   Acid reflux   History of DVT (deep vein thrombosis)   Status post bilateral BKA.   COPD (chronic obstructive pulmonary disease) (HCC)   Chronic systolic CHF (congestive heart failure) (HCC)   Benign essential HTN   Acute thrombus of left ventricle (HCC)   Elevated liver enzymes   Dilated cardiomyopathy (HCC)   Plan   Seems to be improving today - tolerating low dose carvedilol. BP still elevated - d/w neurlogy, will start low dose losartan 25 mg daily. Continue statin - check liver enzymes this week. Ultimately would transition heparin to warfarin or DOAC (not sure what would ultimately be better for compliance).  Time Spent Directly with Patient:  I have spent a total of 35 minutes with the patient reviewing hospital notes, telemetry, EKGs, labs and examining the patient as well as establishing an assessment and plan that was discussed personally with the patient.  > 50% of time was spent in direct patient care.  Length of Stay:  LOS: 2 days   Chrystie Nose, MD, [facility], FACP  [place]  [facility]  Medical Director of the [facility]  Card[facility] Clinic Diplomate of the American Board of Clinical Lipidology Attending Cardiologist  Direct Dial: [phone]  Fax: [phone]  Website:  [email or web address] 03/06/2024, 8:57  AM

## 2024-03-06 NOTE — Progress Notes (Signed)
 eLink Physician-Brief Progress Note Patient Name: [name] DOB: March 20, 1966 MRN: [id number]   Date of Service  03/06/2024  HPI/Events of Note   From ED w/slurred speech, right-sided weakness.  Home meds include aripiprazole and oxycodone, now able to tolerate oral intake  eICU Interventions  Resume home meds     Intervention Category Minor Interventions: Routine modifications to care plan (e.g. PRN medications for pain, fever)  Danniell Rotundo 03/06/2024, 11:49 PM

## 2024-03-06 NOTE — Progress Notes (Signed)
 STROKE TEAM PROGRESS NOTE   BRIEF HPI [name] is a 58 y.o. 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     SIGNIFICANT HOSPITAL EVENTS 4/4:  - Presentation for slurred speech ad right-sided weakness - CT head revealed an acute left MCA territory infarct affecting the insula, caudate nucleus, lentiform nucleus, and internal capsule within aspects of 6.  Also showed hyperdensity of the M1/proximal M2 left MCA.  - CTA showed acute occlusion of the proximal M2 left MCA, severe near occlusive stenosis of the left vertebral artery, severe stenosis of the right posterior cerebral artery branch at P3/P4 junction.  - Patient not a candidate for IR as his last known well was 26 hours PTA with a modified Rankin score of 4 and aspects of 6. - 2D Echo: Severe biventricular dysfunction with large mobile clots in the LA, RV, and LV.  LVEF <20%.  Mild dilation of left and right atria. Patient was started on a heparin gtt and admitted to the ICU   INTERIM HISTORY/SUBJECTIVE Patient is sitting up in bed.  He is awake alert and interactive today.  He is doing much better.  Speech is quite clear and he is able to speak short sentences.  He has good comprehension.  He is having a bedside swallow eval by speech  OBJECTIVE CBC    Component Value Date/Time   WBC 6.4 03/05/2024 0609   RBC 4.53 03/05/2024 0609   HGB 13.9 03/05/2024 0609   HCT 42.3 03/05/2024 0609   PLT 229 03/05/2024 0609   MCV 93.4 03/05/2024 0609   MCH 30.7 03/05/2024 0609   MCHC 32.9 03/05/2024 0609   RDW 16.5 (H) 03/05/2024 0609   LYMPHSABS 1.6 03/04/2024 1436    MONOABS 0.7 03/04/2024 1436   EOSABS 0.0 03/04/2024 1436   BASOSABS 0.0 03/04/2024 1436   BMET    Component Value Date/Time   NA 142 03/05/2024 0609   K 3.7 03/05/2024 0609   CL 112 (H) 03/05/2024 0609   CO2 19 (L) 03/05/2024 0609   GLUCOSE 74 03/05/2024 0609   BUN 13 03/05/2024 0609   CREATININE 1.08 03/05/2024 0609   CALCIUM 8.3 (L) 03/05/2024 0609   GFRNONAA >60 03/05/2024 0609   Lab Results  Component Value Date   CHOL 110 03/05/2024   HDL 29 (L) 03/05/2024   LDLCALC 71 03/05/2024   TRIG 51 03/05/2024   CHOLHDL 3.8 03/05/2024   Lab Results  Component Value Date   HGBA1C 5.8 (H) 01/02/2024   Drugs of Abuse     Component Value Date/Time   LABOPIA POSITIVE (A) 03/05/2024 1121   COCAINSCRNUR POSITIVE (A) 03/05/2024 1121   COCAINSCRNUR POSITIVE (A) 12/25/2016 1533   LABBENZ POSITIVE (A) 03/05/2024 1121   AMPHETMU NONE DETECTED 03/05/2024 1121   THCU NONE DETECTED 03/05/2024 1121   LABBARB NONE DETECTED 03/05/2024 1121    IMAGING past 24 hours No results found.  Vitals:   03/06/24 0800 03/06/24 0900 03/06/24 1000 03/06/24 1100  BP: (!) 131/94 (!) 153/102 (!) 120/97   Pulse: 72 84 82   Resp: (!) 22 13 (!) 22   Temp:      TempSrc:      SpO2:  100% 100% 100%   Weight:    77 kg  Height:    5\' 9"  (1.753 m)   PHYSICAL EXAM General:  Acutely ill appearing African American male laying in ICU bed in no acute distress Psych:  Unable to assess, patient is drowsy in the bed with only brief arousal to repeat stimulation. CV: Regular rate and rhythm on monitor Respiratory:  Regular, unlabored respirations on room air GI: Abdomen soft and nontender  NEURO:  Mental Status: Awake alert mild expressive aphasia with nonfluent speech and can speak short sentences.  Marland Kitchen  Speech is severely dysarthric with very minimal verbal responses to examiner.  Does not participate in naming objects or repeating phrases.   Patient has a slight left gaze preference but will occasionally  track right of midline.  Right hemianopia. Right facial droop.  Good antigravity movements on the left.  Right hemiplegia is present with minimal movement without gravity on the right.  Gait deferred due to bilateral chronic BKA.  ASSESSMENT/PLAN Acute Ischemic Infarct: Acute infarct of the left basal ganglia and insula with punctate foci that extend to the subcortical white matter of the left frontal and parietal lobes. Etiology:  Cardioembolic with findings of large mobile clots in the LA, RV, and LV on 2d echo.  CT head: Left MCA territory infarct affecting the insula, caudate nucleus, lentiform nucleus, and internal capsule.  Aspects is 6.  Asymmetric hyperdensity of the M1/proximal M2 left MCA suspicious for the presence of thrombus.  A CTA of the head/neck is recommended for further evaluation.  Background parenchymal atrophy, chronic small vessel ischemic disease and chronic infarcts, as described. CTA head: The common carotid and internal carotid arteries are patent within the neck.  Atherosclerotic plaque bilaterally, as described.  Most notably, there is a severe >90% stenosis at the right ICA origin which is similar to the prior CTA of 02/03/2022.  The vertebral arteries are patent in the neck.  Progressive severe stenosis of the left vertebral artery proximal V1 segment.  Aortic atherosclerosis and emphysema. CTA head: Acute occlusion of the proximal M2 left MCA vessel.  Background intracranial atherosclerotic disease with multifocal stenoses, most notably as follows.  Severe near occlusive stenosis of the intracranial left vertebral artery new from prior CTA 02/03/2022.  Severe stenosis of a right posterior cerebral artery branch at the P3/P4 junction, also new from the prior exam. CT perfusion: The perfusion solver identifies regions of hypoperfusion totaling 117 mL.  These foci are predominantly located within the left MCA vascular territory, although some foci are present within the right  cerebral watershed territory.  The perfusion solver identifies no core infarct.  However, an acute infarct was demonstrated on the noncontrast head CT performed earlier today, as described.  The perfusion software reports a mismatch volume of 117 mL.  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 MRI brain: Acute infarct of the left basal ganglia and insula with punctate foci that extend to the subcortical white matter of the left frontal and parietal lobes.  No hemorrhage or mass effect.  Multiple old cerebellar infarcts and findings of chronic small vessel ischemia. 2D Echo there is a large mobile clot in the LV that appears to be attached to the interventricular septum just under the mitral valve.  LVEF <20%.  The left ventricle has severely decreased function with global hypokinesis.  There is a small RV apical clot.  Right ventricular systolic function is severely reduced.  There is a larger mobile clot in the LA.  Right and left atria are mildly dilated. 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  Cardiomyopathy HFrEF Intracardiac thrombi  NSVT overnight  TTE EF < 20% Likely cardiac myopathy Large mobile clot in the LV Small RV apical clot, larger mobile clot in the LA On heparin drip Hypercoagulable work up ordered, pending  Prescribed home carvedilol, torsemide, Bidil per cardiology Concern for chronic medication non-adherence Cardiology on board  Hypertension Home meds: Carvedilol, torsemide, Bidil  Stable Blood pressure  goal: gradual normotension  Hyperlipidemia Home meds:  None LDL 71, goal < 70 Rosuvastatin 5 mg daily added by cardiology High intensity statin not indicated as patient is near goal without current medications Continue statin at discharge  Diabetes type II Controlled Hypoglycemia during admission Home meds:   HgbA1c 5.8, goal < 7.0 CBGs q4h Dextrose amp for hypoglycemia Will need tube feed initiation via NGT if fails swallow study for speech therapy Coretrack placement on Monday 03/07/24 as needed if patient does not pass swallow evaluation  Tobacco Abuse Remote tobacco use reported      Ready to quit? N/A  Substance Abuse Patient has a history of cocaine use UDS ordered Reports last cocaine use 2 days PTA      Ready to quit? N/A TOC consult for cessation as patient is able to participate  Dysphagia Patient has post-stroke dysphagia, SLP consulted    Diet   DIET - DYS 1 Room service appropriate? Yes with Assist; Fluid consistency: Nectar Thick  Will need Coretrack placement Monday if he is unable to tolerate PO  Attempt NGT today if he does not pass swallow with ST reevaluation   Other Stroke Risk Factors Obesity, Body mass index is 25.07 kg/m., BMI >/= 30 associated with increased stroke risk, recommend weight loss, diet and exercise as appropriate  Questionable alcohol use, level <10 on arrival Coronary artery disease Congestive heart failure  Other Active Problems Schizoaffective disorder, Bipolar 1 disorder, history of SI and suicidal attempts, depression Continue home Abilify COPD Trelegy ellipta at home Home Pulmicort, albuterol Chronic pain On home gabapentin, oxycodone GERD On home protonix Bilateral BKA  Hospital day # 2   Patient neurological exam shows improvement but patient still has expressive aphasia with moderate dysarthria and right hemiparesis.  He remains on IV heparin for his cardiac thrombi and severe cardiomyopathy.  Patient also has  significant dysphagia and awaits speech therapy eval but hopefully will be able to swallow soon.  Switch IV heparin to Eliquis when patient is able to swallow.  No family at the bedside.  Appreciate cardiology help.  Discussed with Dr. Celine Mans critical care medicine and Dr. Rennis Golden cardiology.  This patient is critically ill and at significant risk of neurological worsening, death and care requires constant monitoring of vital signs, hemodynamics,respiratory and cardiac monitoring, extensive review of multiple databases, frequent neurological assessment, discussion with family, other specialists and medical decision making of high complexity.I have made any additions or clarifications directly to the above note.This critical care time does not reflect procedure time, or teaching time or supervisory time of PA/NP/Med Resident etc but could  involve care discussion time.  I spent 30 minutes of neurocritical care time  in the care of  this patient.        Delia Heady, MD Medical Director [facility] Pager: [phone] 03/06/2024 1:03 PM  To contact Stroke Continuity provider, please refer to [email or web address]. After hours, contact General Neurology

## 2024-03-06 NOTE — Progress Notes (Addendum)
 NAME:  [name], MRN:  [id number], DOB:  [birth year], LOS: 2 ADMISSION DATE:  03/04/2024, CONSULTATION DATE:  03/06/2024  REFERRING MD:  Orland Mustard , CHIEF COMPLAINT: Altered mental status  History of Present Illness:  58 year old man with cardiomyopathy and active cocaine use brought in as a code stroke by EMS. LKN around 12 PM.  His caregiver Inetta Fermo noted slurred speech and confusion.  Right arm and right leg weakness was noted by EMS neurology evaluation showed global aphasia and dysarthria with right facial droop, NIHSS was 20 Head CT showed acute left MCA infarct CTA head and neck showed left M2 occlusion Echo showed large mobile LV thrombus, LA thrombus and RV apical thrombus, EF was less than 20%.  Neurology and cardiology consulting and due to high risk of further emboli, decision made to anticoagulate and admit to ICU.  LV thrombus is more than 3 cm in size  He has a history of repeated admissions for heart failure and COPD exacerbation at least 3 this year. He has a history of active cocaine use  Pertinent  Medical History  Asthma, COPD,  GERD,  HTN , prolonged QT, Depression,  Schizoaffective d/o,  HFref, ,E <20%   DMII, s/p b/l lower extremity amputee,  chronic pain and polysubstance abuse - active cocaine use.   Significant Hospital Events: Including procedures, antibiotic start and stop dates in addition to other pertinent events   MRI brain 4/4>>acute left basal ganglia and insula infarct. Multiple old infarcts  Interim History / Subjective:  Yesterday refused NG tube so we started D5 infusion for hypoglycemia. Asking for food but he has failed SLP eval x2 for aspiration risk.  Objective   Blood pressure (!) 138/97, pulse 71, temperature 98.1 F (36.7 C), temperature source Axillary, resp. rate 12, height 4\' 2"  (1.27 m), weight 77 kg, SpO2 100%.        Intake/Output Summary (Last 24 hours) at 03/06/2024 0805 Last data filed at 03/06/2024 0600 Gross per 24 hour   Intake 1064.92 ml  Output 450 ml  Net 614.92 ml   Filed Weights   03/04/24 1550 03/06/24 0500  Weight: 77 kg 77 kg    Examination: Resting comfortably, no respiratory distress Breathing non labored, no wheezing RRR Dysarthria, moves all 4 extremities Bilateral lower extremity amputations  CBGs ok    Resolved Hospital Problem list     Assessment & Plan:  Acute left MCA CVA Likely embolic, outside window for TNK or thrombectomy Failed swallow eval x2 today Will need NG tube, currently refusing. Continue D5. Needs Modified barium swallow per SLP prior to attempt oral intake again  RV/LA/LV thrombus  -high risk embolising, IV heparin benefit outweighs risk of hemorrhagic transformation. Since he is intermittently refusing blood draws will switch to lovenox therapeutic dosing Hypercoag workup in process  Hypertension -allow permissive hypertension up to 180/110 Cocaine positive, tolerating low dose BB  Chronic HFrEF  hold torsemide, resume when able to take oral. IV Lasix if needed  Schizoaffective -resume Abilify if able to take oral  COPD -triple nebs during hospitalization Albuterol for rescue  Chronic pain -resume oxycodone and Neurontin when able   Best Practice (right click and "Reselect all SmartList Selections" daily)   Diet/type: NPO DVT prophylaxis systemic heparin Pressure ulcer(s): N/A GI prophylaxis: PPI Lines: N/A Foley:  N/A Code Status:  full code Last date of multidisciplinary goals of care discussion [no family available currently] -attempted to reach [employer], contact person listed in the chart.  I  asked him if he wanted me to call anyone for him to update them. He declined.   No urgent ICU needs at this time. Will send out to med tele and sign out to [facility] to assume care 4/7.   Durel Salts, MD Pulmonary and Critical Care Medicine [facility] 03/06/2024 8:29 AM Pager: see AMION  If no response to pager, please call  critical care on call (see AMION) until 7pm After 7:00 pm call Elink

## 2024-03-06 NOTE — Progress Notes (Signed)
 0230 - Pt having some intermittent wheezing, SOB.  Previously given neb tx's, CCM Dr. Katrinka Blazing notified to see if he wants to adjust fluids with EF<20.  Order changed to D10 @30   0345 - pt c/o generalized back, moaning, calling out.  CCM notified, Dr. Katrinka Blazing.  One time order given for morphine IV.

## 2024-03-06 NOTE — Progress Notes (Signed)
 Speech Language Pathology Treatment: Dysphagia  Patient Details Name: Damain Broadus MRN: [id number] DOB: [birth year] Today's Date: 03/06/2024 Time: 4098-1191 SLP Time Calculation (min) (ACUTE ONLY): 16 min  Assessment / Plan / Recommendation Clinical Impression  Pt fully alert, dysarthric, overt right lingual weakness but speech intelligible and voice strong. Pt has a wheezy congested respiratory pattern. MD notes baseline COPD. Pt able to drink water consecutively, but does not slow rate when asked to do so. Pt fed himself a cup of puree with no oral residue and took a pill whole in puree easily. Then able to drink 8 oz of nectar thick juice from a straw without any signs of aspiration and no oral residue observed. When cautioned to slow rate or take one sip at a time pt does not follow instruction. He has insisted he does not want a feeding tube (which is not needed at this point in any case) and does not want to participate in MBS. Will start a dys 1/nectar thick diet today and f/u for upgrade to potentially dys 2 or 3 and thin tomorrow if well tolerated.    HPI HPI: Pt is a 58 y.o. male who presented to ED with stroke like symptoms. Caregiver found him with slurred speech and right sided weakness. MRI (03/04/24) revealed "Acute infarct of the left basal ganglia and insula with punctate foci that extend to the subcortical white matter of the left frontal and parietal lobes. Multiple old cerebellar infarcts and findings of chronic small vessel ischemia". Previous BSE (08/21/22) revealed no s/sx of aspiration or dysphagia with regular diet/thin liquids initiated. 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 PMH: cocaine abuse, schizoaffective d/o, depression, T2DM, HTN, GERD, systolic CHF, COPD, s/p b/l lower extremity amputee, chronic pain.      SLP Plan  Continue with current plan of care;MBS      Recommendations for follow up therapy are one component of a multi-disciplinary discharge  planning process, led by the attending physician.  Recommendations may be updated based on patient status, additional functional criteria and insurance authorization.    Recommendations  Diet recommendations: Dysphagia 1 (puree);Nectar-thick liquid Liquids provided via: Teaspoon Medication Administration: Whole meds with puree Supervision: Full supervision/cueing for compensatory strategies Compensations: Minimize environmental distractions;Slow rate;Small sips/bites Postural Changes and/or Swallow Maneuvers: Seated upright 90 degrees                  Oral care QID   Frequent or constant Supervision/Assistance Dysphagia, unspecified (R13.10)     Continue with current plan of care;MBS     Khamila Bassinger, Riley Nearing  03/06/2024, 8:56 AM

## 2024-03-07 ENCOUNTER — Inpatient Hospital Stay (HOSPITAL_COMMUNITY): Payer: MEDICAID

## 2024-03-07 ENCOUNTER — Encounter (HOSPITAL_COMMUNITY): Payer: MEDICAID

## 2024-03-07 ENCOUNTER — Other Ambulatory Visit (HOSPITAL_COMMUNITY): Payer: Self-pay

## 2024-03-07 ENCOUNTER — Telehealth (HOSPITAL_COMMUNITY): Payer: Self-pay | Admitting: Pharmacy Technician

## 2024-03-07 DIAGNOSIS — I63512 Cerebral infarction due to unspecified occlusion or stenosis of left middle cerebral artery: Secondary | ICD-10-CM | POA: Diagnosis not present

## 2024-03-07 DIAGNOSIS — I429 Cardiomyopathy, unspecified: Secondary | ICD-10-CM | POA: Diagnosis not present

## 2024-03-07 DIAGNOSIS — I639 Cerebral infarction, unspecified: Secondary | ICD-10-CM | POA: Diagnosis not present

## 2024-03-07 DIAGNOSIS — I513 Intracardiac thrombosis, not elsewhere classified: Secondary | ICD-10-CM | POA: Diagnosis not present

## 2024-03-07 DIAGNOSIS — I240 Acute coronary thrombosis not resulting in myocardial infarction: Secondary | ICD-10-CM | POA: Diagnosis not present

## 2024-03-07 DIAGNOSIS — E11649 Type 2 diabetes mellitus with hypoglycemia without coma: Secondary | ICD-10-CM | POA: Diagnosis not present

## 2024-03-07 DIAGNOSIS — I5022 Chronic systolic (congestive) heart failure: Secondary | ICD-10-CM | POA: Diagnosis not present

## 2024-03-07 LAB — BASIC METABOLIC PANEL WITH GFR
Anion gap: 9 (ref 5–15)
BUN: 15 mg/dL (ref 6–20)
CO2: 22 mmol/L (ref 22–32)
Calcium: 8.6 mg/dL — ABNORMAL LOW (ref 8.9–10.3)
Chloride: 109 mmol/L (ref 98–111)
Creatinine, Ser: 0.99 mg/dL (ref 0.61–1.24)
GFR, Estimated: >60 mL/min (ref >60)
Glucose, Bld: 100 mg/dL — ABNORMAL HIGH (ref 70–99)
Potassium: 4 mmol/L (ref 3.5–5.1)
Sodium: 140 mmol/L (ref 135–145)

## 2024-03-07 LAB — CBC
HCT: 42.3 % (ref 39.0–52.0)
Hemoglobin: 13.9 g/dL (ref 13.0–17.0)
MCH: 30.6 pg (ref 26.0–34.0)
MCHC: 32.9 g/dL (ref 30.0–36.0)
MCV: 93.2 fL (ref 80.0–100.0)
Platelets: 199 10*3/uL (ref 150–400)
RBC: 4.54 MIL/uL (ref 4.22–5.81)
RDW: 16.5 % — ABNORMAL HIGH (ref 11.5–15.5)
WBC: 5.4 10*3/uL (ref 4.0–10.5)
nRBC: 0 % (ref 0.0–0.2)

## 2024-03-07 LAB — GLUCOSE, CAPILLARY: Glucose-Capillary: 123 mg/dL — ABNORMAL HIGH (ref 70–99)

## 2024-03-07 LAB — CARDIOLIPIN ANTIBODIES, IGG, IGM, IGA
Anticardiolipin IgA: <9 U/mL (ref 0–11)
Anticardiolipin IgG: <9 GPL U/mL (ref 0–14)
Anticardiolipin IgM: 14 [MPL'U]/mL — ABNORMAL HIGH (ref 0–12)

## 2024-03-07 LAB — PROTEIN C, TOTAL: Protein C, Total: 43 % — ABNORMAL LOW (ref 60–150)

## 2024-03-07 MED ORDER — APIXABAN 5 MG PO TABS
5.0000 mg | ORAL_TABLET | Freq: Two times a day (BID) | ORAL | Status: AC
  Administered 2024-03-07 – 2024-04-27 (×89): 5 mg via ORAL
  Filled 2024-03-07 (×17): qty 1
  Filled 2024-03-07: qty 2
  Filled 2024-03-07 (×28): qty 1
  Filled 2024-03-07: qty 2
  Filled 2024-03-07 (×15): qty 1
  Filled 2024-03-07: qty 2
  Filled 2024-03-07 (×10): qty 1
  Filled 2024-03-07 (×2): qty 2
  Filled 2024-03-07 (×6): qty 1
  Filled 2024-03-07: qty 2
  Filled 2024-03-07 (×8): qty 1
  Filled 2024-03-07: qty 2
  Filled 2024-03-07 (×11): qty 1
  Filled 2024-03-07: qty 2
  Filled 2024-03-07 (×6): qty 1
  Filled 2024-03-07 (×2): qty 2
  Filled 2024-03-07 (×3): qty 1

## 2024-03-07 NOTE — TOC CAGE-AID Note (Signed)
 Transition of Care [facility]) - CAGE-AID Screening   Patient Details  Name: [name] MRN: [id number] Date of Birth: [birth year]  Transition of Care [facility]) CM/SW Contact:    [name] Latin, [name] Phone Number: 03/07/2024, 5:07 PM   Clinical Narrative: Patient is currently Ox2 and not appropriate for assessment at this time. He did test positive for cocaine.   CAGE-AID Screening: Substance Abuse Screening unable to be completed due to: : Patient unable to participate

## 2024-03-07 NOTE — Plan of Care (Signed)
  Problem: Education: Goal: Knowledge of disease or condition will improve Outcome: Progressing Goal: Knowledge of secondary prevention will improve (MUST DOCUMENT ALL) Outcome: Progressing Goal: Knowledge of patient specific risk factors will improve (DELETE if not current risk factor) Outcome: Progressing   Problem: Ischemic Stroke/TIA Tissue Perfusion: Goal: Complications of ischemic stroke/TIA will be minimized Outcome: Progressing   Problem: Coping: Goal: Will verbalize positive feelings about self Outcome: Progressing Goal: Will identify appropriate support needs Outcome: Progressing   Problem: Health Behavior/Discharge Planning: Goal: Ability to manage health-related needs will improve Outcome: Progressing Goal: Goals will be collaboratively established with patient/family Outcome: Progressing   Problem: Self-Care: Goal: Ability to participate in self-care as condition permits will improve Outcome: Progressing Goal: Verbalization of feelings and concerns over difficulty with self-care will improve Outcome: Progressing Goal: Ability to communicate needs accurately will improve Outcome: Progressing   Problem: Nutrition: Goal: Risk of aspiration will decrease Outcome: Progressing Goal: Dietary intake will improve Outcome: Progressing   Problem: Education: Goal: Knowledge of General Education information will improve Description: Including pain rating scale, medication(s)/side effects and non-pharmacologic comfort measures Outcome: Progressing   Problem: Health Behavior/Discharge Planning: Goal: Ability to manage health-related needs will improve Outcome: Progressing   Problem: Clinical Measurements: Goal: Ability to maintain clinical measurements within normal limits will improve Outcome: Progressing Goal: Will remain free from infection Outcome: Progressing Goal: Diagnostic test results will improve Outcome: Progressing Goal: Respiratory complications will  improve Outcome: Progressing Goal: Cardiovascular complication will be avoided Outcome: Progressing   Problem: Activity: Goal: Risk for activity intolerance will decrease Outcome: Progressing   Problem: Nutrition: Goal: Adequate nutrition will be maintained Outcome: Progressing   Problem: Coping: Goal: Level of anxiety will decrease Outcome: Progressing   Problem: Elimination: Goal: Will not experience complications related to bowel motility Outcome: Progressing Goal: Will not experience complications related to urinary retention Outcome: Progressing   Problem: Pain Managment: Goal: General experience of comfort will improve and/or be controlled Outcome: Progressing   Problem: Safety: Goal: Ability to remain free from injury will improve Outcome: Progressing   Problem: Skin Integrity: Goal: Risk for impaired skin integrity will decrease Outcome: Progressing

## 2024-03-07 NOTE — Progress Notes (Signed)
 PROGRESS NOTE    [name]  JYN:[id number] DOB: [birth year] DOA: 03/04/2024 PCP: Georganna Skeans, MD   Brief Narrative:  58 year old man with cardiomyopathy and active cocaine use brought in as a code stroke by EMS.  His caregiver Inetta Fermo noted slurred speech and confusion.  Right arm and right leg weakness was noted by EMS neurology evaluation showed global aphasia and dysarthria with right facial droop, NIHSS was 20 Head CT showed acute left MCA infarct CTA head and neck showed left M2 occlusion Echo showed large mobile LV thrombus, LA thrombus and RV apical thrombus, EF was less than 20%.  Neurology and cardiology consulting and due to high risk of further emboli, decision made to anticoagulate and admit to ICU.  LV thrombus is more than 3 cm in size.  He was initially admitted under hospital service, transferred under ICU same day on 03/04/2024.  Now being transferred under [facility] care again on 03/07/2024.   Assessment & Plan:   Principal Problem:   Acute CVA (cerebrovascular accident) (HCC) Active Problems:   Acute thrombus of left ventricle (HCC)   Chronic systolic CHF (congestive heart failure) (HCC)   Prolonged QT interval   Elevated liver enzymes   COPD (chronic obstructive pulmonary disease) (HCC)   Cocaine abuse (HCC)   Schizoaffective disorder, bipolar type (HCC)   Benign essential HTN   Acid reflux   History of DVT (deep vein thrombosis)   Type 2 diabetes mellitus (HCC)   Status post bilateral BKA.   Dilated cardiomyopathy (HCC)  Acute left MCA CVA:  CT head: Left MCA territory infarct affecting the insula, caudate nucleus, lentiform nucleus, and internal capsule.  Aspects is 6.  Asymmetric hyperdensity of the M1/proximal M2 left MCA suspicious for the presence of thrombus.  A CTA of the head/neck is recommended for further evaluation.  Background parenchymal atrophy, chronic small vessel ischemic disease and chronic infarcts, as described. CTA head: The common carotid and  internal carotid arteries are patent within the neck.  Atherosclerotic plaque bilaterally, as described.  Most notably, there is a severe >90% stenosis at the right ICA origin which is similar to the prior CTA of 02/03/2022.  The vertebral arteries are patent in the neck.  Progressive severe stenosis of the left vertebral artery proximal V1 segment.  Aortic atherosclerosis and emphysema. CTA head: Acute occlusion of the proximal M2 left MCA vessel.  Background intracranial atherosclerotic disease with multifocal stenoses, most notably as follows.  Severe near occlusive stenosis of the intracranial left vertebral artery new from prior CTA 02/03/2022.  Severe stenosis of a right posterior cerebral artery branch at the P3/P4 junction, also new from the prior exam. CT perfusion: The perfusion solver identifies regions of hypoperfusion totaling 117 mL.  These foci are predominantly located within the left MCA vascular territory, although some foci are present within the right cerebral watershed territory.  The perfusion solver identifies no core infarct.  However, an acute infarct was demonstrated on the noncontrast head CT performed earlier today, as described.  The perfusion software reports a mismatch volume of 117 mL.  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 MRI brain: Acute infarct of the left basal ganglia and insula with punctate foci that extend to the subcortical white matter of the left frontal and parietal lobes.  No hemorrhage or mass effect.  Multiple old cerebellar infarcts and findings of chronic small vessel ischemia. 2D Echo there  is a large mobile clot in the LV that appears to be attached to the interventricular septum just under the mitral valve.  LVEF <20%.  The left ventricle has severely decreased function with global hypokinesis.  There is a small RV apical clot.  Right  ventricular systolic function is severely reduced.  There is a larger mobile clot in the LA.  Right and left atria are mildly dilated. LDL 71 HgbA1c 5.8 VTE prophylaxis-on Lovenox but transition to Eliquis today. No antithrombotic prior to admission, now on Lovenox for intracardiac thrombus.  Dysphagia: Finally passed swallow evaluation, has been started on dysphagia 1 diet for 05/20/2024.   RV/LA/LV thrombus -high risk embolising, No family available to have risk-benefit discussion.  No evidence of PFO.  Hypercoagulability workup in process.  Patient on Lovenox, neurology recommended DOAC or warfarin, with patient being noncompliant, warfarin is likely not a good option, discussed with pharmacy, co-pay for Eliquis only f $4.  Transition to Eliquis.     Hypertension: Now on Coreg.  Blood pressure still elevated.   HFrEF -EF 20%.  On Coreg and losartan.  Not on any diuretics.  Cardiology on board and managing.   Schizoaffective: Continue Abilify.   COPD -triple nebs during hospitalization Albuterol for rescue   Chronic pain: Continue home dose of oxycodone.  Polysubstance abuse: UDS positive for opiates, cocaine and benzos.  DVT prophylaxis: Eliquis   Code Status: Full Code  Family Communication:  None present at bedside.  Plan of care discussed with patient in length and he/she verbalized understanding and agreed with it.  Status is: Inpatient Remains inpatient appropriate because: Pending clearance from neurology and cardiology   Estimated body mass index is 24.42 kg/m as calculated from the following:   Height as of this encounter: 5\' 9"  (1.753 m).   Weight as of this encounter: 75 kg.    Nutritional Assessment: Body mass index is 24.42 kg/m.Marland Kitchen Seen by dietician.  I agree with the assessment and plan as outlined below: Nutrition Status:        . Skin Assessment: I have examined the patient's skin and I agree with the wound assessment as performed by the wound care RN  as outlined below:    Consultants:  Cardiology and neurology  Procedures:  As above  Antimicrobials:  Anti-infectives (From admission, onward)    None         Subjective: Patient seen and examined, sitting at the edge of the bed, nurse at the bedside.  Patient has significant dysarthria, speech incomprehensible at times.  Objective: Vitals:   03/07/24 0500 03/07/24 0600 03/07/24 0630 03/07/24 0700  BP: 132/88 (!) 150/114    Pulse: 72 90 73   Resp: (!) 21 (!) 26 (!) 22   Temp:    97.7 F (36.5 C)  TempSrc:    Axillary  SpO2: 93% (!) 76% 100%   Weight: 75 kg     Height:        Intake/Output Summary (Last 24 hours) at 03/07/2024 0736 Last data filed at 03/07/2024 0600 Gross per 24 hour  Intake 436.17 ml  Output --  Net 436.17 ml   Filed Weights   03/06/24 0500 03/06/24 1100 03/07/24 0500  Weight: 77 kg 77 kg 75 kg    Examination:  General exam: Appears calm and comfortable  Respiratory system: Clear to auscultation. Respiratory effort normal. Cardiovascular system: S1 & S2 heard, RRR. No JVD, murmurs, rubs, gallops or clicks. No pedal edema. Gastrointestinal system: Abdomen is nondistended, soft  and nontender. No organomegaly or masses felt. Normal bowel sounds heard. Central nervous system: Alert and appears to be oriented.  Expressive aphasia, right hemianopia, right facial droop right hemiplegia. Skin/extremities: No rashes, lesions or ulcers, bilateral BKA   Data Reviewed: I have personally reviewed following labs and imaging studies  CBC: Recent Labs  Lab 03/04/24 1436 03/05/24 0609  WBC 6.6 6.4  NEUTROABS 4.2  --   HGB 14.3  15.0 13.9  HCT 43.8  44.0 42.3  MCV 93.8 93.4  PLT 210 229   Basic Metabolic Panel: Recent Labs  Lab 03/04/24 1436 03/04/24 1621 03/05/24 0609  NA 138  141  --  142  K 3.7  3.6  --  3.7  CL 108  109  --  112*  CO2 19*  --  19*  GLUCOSE 136*  136*  --  74  BUN 17  19  --  13  CREATININE 1.04  1.00  --  1.08   CALCIUM 7.9*  --  8.3*  MG  --  1.3*  --    GFR: Estimated Creatinine Clearance: 77.3 mL/min (by C-G formula based on SCr of 1.08 mg/dL). Liver Function Tests: Recent Labs  Lab 03/04/24 1436  AST 59*  ALT 87*  ALKPHOS 67  BILITOT 2.4*  PROT 5.7*  ALBUMIN 2.9*   No results for input(s): "LIPASE", "AMYLASE" in the last 168 hours. No results for input(s): "AMMONIA" in the last 168 hours. Coagulation Profile: Recent Labs  Lab 03/04/24 1436  INR 1.5*   Cardiac Enzymes: No results for input(s): "CKTOTAL", "CKMB", "CKMBINDEX", "TROPONINI" in the last 168 hours. BNP (last 3 results) No results for input(s): "PROBNP" in the last 8760 hours. HbA1C: No results for input(s): "HGBA1C" in the last 72 hours. CBG: Recent Labs  Lab 03/06/24 0728 03/06/24 1041 03/06/24 1544 03/06/24 2000 03/06/24 2350  GLUCAP 75 103* 110* 160* 163*   Lipid Profile: Recent Labs    03/05/24 0609  CHOL 110  HDL 29*  LDLCALC 71  TRIG 51  CHOLHDL 3.8   Thyroid Function Tests: No results for input(s): "TSH", "T4TOTAL", "FREET4", "T3FREE", "THYROIDAB" in the last 72 hours. Anemia Panel: No results for input(s): "VITAMINB12", "FOLATE", "FERRITIN", "TIBC", "IRON", "RETICCTPCT" in the last 72 hours. Sepsis Labs: No results for input(s): "PROCALCITON", "LATICACIDVEN" in the last 168 hours.  Recent Results (from the past 240 hours)  MRSA Next Gen by PCR, Nasal     Status: None   Collection Time: 03/04/24 11:10 PM   Specimen: Nasal Mucosa; Nasal Swab  Result Value Ref Range Status   MRSA by PCR Next Gen NOT DETECTED NOT DETECTED Final    Comment: (NOTE) The GeneXpert MRSA Assay (FDA approved for NASAL specimens only), is one component of a comprehensive MRSA colonization surveillance program. It is not intended to diagnose MRSA infection nor to guide or monitor treatment for MRSA infections. Test performance is not FDA approved in patients less than 73 years old. Performed at [facility], [address], [place], [name] [address]      Radiology Studies: No results found.  Scheduled Meds:  arformoterol  15 mcg Nebulization BID   ARIPiprazole  5 mg Oral Daily   budesonide (PULMICORT) nebulizer solution  0.5 mg Nebulization BID   carvedilol  3.125 mg Oral BID WC   Chlorhexidine Gluconate Cloth  6 each Topical Daily   enoxaparin (LOVENOX) injection  80 mg Subcutaneous BID   losartan  25 mg Oral Daily  nicotine  14 mg Transdermal Daily   pantoprazole (PROTONIX) IV  40 mg Intravenous Q24H   revefenacin  175 mcg Nebulization Daily   rosuvastatin  5 mg Oral QHS   Continuous Infusions:  dextrose Stopped (03/06/24 1232)     LOS: 3 days   Hughie Closs, MD [facility]  03/07/2024, 7:36 AM   *Please note that this is a verbal dictation therefore any spelling or grammatical errors are due to the "Dragon Medical One" system interpretation.  Please page via Amion and do not message via secure chat for urgent patient care matters. Secure chat can be used for non urgent patient care matters.  How to contact the [facility] Attending or Consulting provider 7A - 7P or covering provider during after hours 7P -7A, for this patient?  Check the care team in [facility] and look for a) attending/consulting TRH provider listed and b) the [facility] team listed. Page or secure chat 7A-7P. Log into [email or web address] and use [facility] universal password to access. If you do not have the password, please contact the hospital operator. Locate the [facility] provider you are looking for under [facility] and page to a number that you can be directly reached. If you still have difficulty reaching the provider, please page the [facility] (Director on Call) for the Hospitalists listed on amion for assistance.

## 2024-03-07 NOTE — Progress Notes (Signed)
 PHARMACY - ANTICOAGULATION CONSULT NOTE  Pharmacy Consult for Eliquis Indication:  LV Thrombus  Allergies  Allergen Reactions   Acetaminophen Hives, Nausea And Vomiting, Rash and Anaphylaxis    PT states that if he takes this he is going to the hospital   Motrin [Ibuprofen] Anaphylaxis   Nsaids Anaphylaxis    PT states that if he takes this he is going to the hospital   Lyrica Cr [Pregabalin Er] Other (See Comments)    Dystonia Dysarthria    Peanut-Containing Drug Products Other (See Comments)    Pt denies allergy to peanut butter 03/02/2021    Patient Measurements: Height: 5\' 9"  (175.3 cm) Weight: 75 kg (165 lb 5.5 oz) IBW/kg (Calculated) : 70.7 HEPARIN DW (KG): 77  Vital Signs: Temp: 97.7 F (36.5 C) (04/07 0700) Temp Source: Axillary (04/07 0700) BP: 122/81 (04/07 0900) Pulse Rate: 68 (04/07 0900)  Labs: Recent Labs    03/04/24 1436 03/05/24 0609 03/05/24 0700 03/05/24 2006 03/07/24 1009  HGB 14.3  15.0 13.9  --   --  13.9  HCT 43.8  44.0 42.3  --   --  42.3  PLT 210 229  --   --  199  APTT 30  --   --   --   --   LABPROT 18.1*  --   --   --   --   INR 1.5*  --   --   --   --   HEPARINUNFRC  --   --  <0.10* <0.10*  --   CREATININE 1.04  1.00 1.08  --   --   --     Estimated Creatinine Clearance: 77.3 mL/min (by C-G formula based on SCr of 1.08 mg/dL).   Medical History: Past Medical History:  Diagnosis Date   Asthma    Bipolar 1 disorder (HCC)    CHF (congestive heart failure) (HCC)    COPD (chronic obstructive pulmonary disease) (HCC)    Depression    Diabetes mellitus without complication (HCC)    GERD (gastroesophageal reflux disease)    Homelessness    Hypertension    QT prolongation    Schizoaffective disorder (HCC)     Medications:  Medications Prior to Admission  Medication Sig Dispense Refill Last Dose/Taking   albuterol (ACCUNEB) 1.25 MG/3ML nebulizer solution Take 3 mLs (1.25 mg total) by nebulization every 6 (six) hours as needed  for wheezing. 75 mL 12    albuterol (VENTOLIN HFA) 108 (90 Base) MCG/ACT inhaler Inhale 2 puffs into the lungs every 4 (four) hours as needed for wheezing or shortness of breath. 18 g 0    ARIPiprazole (ABILIFY) 5 MG tablet Take 1 tablet (5 mg total) by mouth daily. 30 tablet 1    budesonide (PULMICORT) 0.25 MG/2ML nebulizer solution Take 2 mLs (0.25 mg total) by nebulization daily. 60 mL 5    carvedilol (COREG) 12.5 MG tablet Take 1 tablet (12.5 mg total) by mouth 2 (two) times daily with a meal. 60 tablet 1    carvedilol (COREG) 25 MG tablet Take 1 tablet (25 mg total) by mouth 2 (two) times daily with a meal. 60 tablet 3    Fluticasone-Umeclidin-Vilant (TRELEGY ELLIPTA) 100-62.5-25 MCG/ACT AEPB Inhale 1 puff into the lungs daily. 60 each 1    gabapentin (NEURONTIN) 300 MG capsule Take 2 capsules (600 mg total) by mouth 3 (three) times daily. 180 capsule 1    isosorbide-hydrALAZINE (BIDIL) 20-37.5 MG tablet Take 1 tablet by mouth 3 (three) times daily.  90 tablet 1    nicotine (NICODERM CQ - DOSED IN MG/24 HOURS) 21 mg/24hr patch Place 1 patch (21 mg total) onto the skin daily as needed (Tobacco craving). 28 patch 0    oxyCODONE (ROXICODONE) 5 MG immediate release tablet Take 1 tablet (5 mg total) by mouth every 8 (eight) hours as needed for moderate pain (pain score 4-6) or breakthrough pain. 25 tablet 0    pantoprazole (PROTONIX) 40 MG tablet Take 1 tablet (40 mg total) by mouth daily. 30 tablet 1    predniSONE (DELTASONE) 10 MG tablet Take 60 mg (6tabs) by mouth once daily for 3 days, then 40mg  (4tabs)once daily for 3 days, then 30mg  (3tabs) once daily for 3 days, then 20mg  (2tabs) once daily for 3 days, then 10mg  (1tab) once daily for 3 days and stop. 48 tablet 0    tiotropium (SPIRIVA HANDIHALER) 18 MCG inhalation capsule Place 1 capsule (18 mcg total) into inhaler and inhale daily. 30 capsule 5    torsemide (DEMADEX) 20 MG tablet Take 2 tablets (40 mg total) by mouth daily. 60 tablet 1      Assessment: 58 yo male presents with acute left MCA CVA. 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 CBC wnl. Not on [facility] prior to admission. Initially on IV Heparin then switched to Enoxaparin on 4/6.   Copay check - Eliquis 4$ copay. Consulted by Dr. Leonides Cave to switch to Eliquis at this time. Last dose of Enoxaparin given at 2102 PM. Stroke team aware and ok with change to DOAC.   Goal of Therapy:  Monitor platelets by anticoagulation protocol: Yes   Plan:  Discontinue Lovenox Change to Eliquis 5 po BID Monitor CBC  Link Snuffer, PharmD, BCPS, BCCCP Please refer to [facility] for [facility] Pharmacy numbers 03/07/2024,11:57 AM

## 2024-03-07 NOTE — TOC CM/SW Note (Signed)
 Transition of Care [facility]) - Inpatient Brief Assessment   Patient Details  Name: [name] MRN: [id number] Date of Birth: Nov 13, 1966  Transition of Care [facility]) CM/SW Contact:    [name] Latin, [name] Phone Number: 03/07/2024, 4:45 PM   Clinical Narrative: Patient admitted from home with stroke and PC services. TOC team is familiar with patient and has a difficult disposition with multiple barriers in place. Will continue to follow.     Transition of Care Asessment: Insurance and Status: Insurance coverage has been reviewed Patient has primary care physician: Yes Home environment has been reviewed: From home Prior level of function:: WC Dependent Prior/Current Home Services: Current home services Social Drivers of Health Review: SDOH reviewed no interventions necessary Readmission risk has been reviewed: Yes Transition of care needs: transition of care needs identified, TOC will continue to follow

## 2024-03-07 NOTE — Telephone Encounter (Signed)
 Patient Product/process development scientist completed.    The patient is insured through [facility].     Ran test claim for Eliquis 5 mg and the current 30 day co-pay is $4.00.  Ran test claim for Xarelto 20 mg and the current 30 day co-pay is $4.00.  This test claim was processed through [facility]- copay amounts may vary at other pharmacies due to pharmacy/plan contracts, or as the patient moves through the different stages of their insurance plan.     Roland Earl, CPHT Pharmacy Technician III Certified Patient Advocate [facility] Pharmacy Patient Advocate Team Direct Number: [phone]  Fax: [phone]

## 2024-03-07 NOTE — Progress Notes (Signed)
 STROKE TEAM PROGRESS NOTE   BRIEF HPI [name] is a 58 y.o. 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     SIGNIFICANT HOSPITAL EVENTS 4/4:  - Presentation for slurred speech ad right-sided weakness - CT head revealed an acute left MCA territory infarct affecting the insula, caudate nucleus, lentiform nucleus, and internal capsule within aspects of 6.  Also showed hyperdensity of the M1/proximal M2 left MCA.  - CTA showed acute occlusion of the proximal M2 left MCA, severe near occlusive stenosis of the left vertebral artery, severe stenosis of the right posterior cerebral artery branch at P3/P4 junction.  - Patient not a candidate for IR as his last known well was 26 hours PTA with a modified Rankin score of 4 and aspects of 6. - 2D Echo: Severe biventricular dysfunction with large mobile clots in the LA, RV, and LV.  LVEF <20%.  Mild dilation of left and right atria. Patient was started on a heparin gtt and admitted to the ICU   INTERIM HISTORY/SUBJECTIVE Patient is sitting up in bed.  He remains awake alert and interactive  .    Speech is quite clear and he is able to speak short sentences.  He has good comprehension.  He has past bedside swallow eval by speech and is now on a diet.  Vital signs stable.  OBJECTIVE CBC    Component Value Date/Time   WBC 5.4 03/07/2024 1009   RBC 4.54 03/07/2024 1009   HGB 13.9 03/07/2024 1009   HCT 42.3 03/07/2024 1009   PLT 199 03/07/2024 1009   MCV 93.2 03/07/2024 1009   MCH 30.6 03/07/2024 1009   MCHC 32.9 03/07/2024 1009   RDW 16.5 (H) 03/07/2024 1009   LYMPHSABS 1.6  03/04/2024 1436   MONOABS 0.7 03/04/2024 1436   EOSABS 0.0 03/04/2024 1436   BASOSABS 0.0 03/04/2024 1436   BMET    Component Value Date/Time   NA 140 03/07/2024 1009   K 4.0 03/07/2024 1009   CL 109 03/07/2024 1009   CO2 22 03/07/2024 1009   GLUCOSE 100 (H) 03/07/2024 1009   BUN 15 03/07/2024 1009   CREATININE 0.99 03/07/2024 1009   CALCIUM 8.6 (L) 03/07/2024 1009   GFRNONAA >60 03/07/2024 1009   Lab Results  Component Value Date   CHOL 110 03/05/2024   HDL 29 (L) 03/05/2024   LDLCALC 71 03/05/2024   TRIG 51 03/05/2024   CHOLHDL 3.8 03/05/2024   Lab Results  Component Value Date   HGBA1C 5.8 (H) 01/02/2024   Drugs of Abuse     Component Value Date/Time   LABOPIA POSITIVE (A) 03/05/2024 1121   COCAINSCRNUR POSITIVE (A) 03/05/2024 1121   COCAINSCRNUR POSITIVE (A) 12/25/2016 1533   LABBENZ POSITIVE (A) 03/05/2024 1121   AMPHETMU NONE DETECTED 03/05/2024 1121   THCU NONE DETECTED 03/05/2024 1121   LABBARB NONE DETECTED 03/05/2024 1121    IMAGING past 24 hours No results found.  Vitals:   03/07/24 1200 03/07/24 1300 03/07/24 1400 03/07/24 1500  BP: (!) 120/91 (!) 133/100 (!) 147/88 (!) 140/86  Pulse: 66 63 (!) 56 72  Resp: 17 20 (!) 21 (!) 21  Temp: 98 F (36.7 C)  TempSrc: Axillary     SpO2: 97% 94% 92% 93%  Weight:      Height:       PHYSICAL EXAM General:  Acutely ill appearing African American male laying in ICU bed in no acute distress Psych:  Unable to assess, patient is drowsy in the bed with only brief arousal to repeat stimulation. CV: Regular rate and rhythm on monitor Respiratory:  Regular, unlabored respirations on room air GI: Abdomen soft and nontender  NEURO:  Mental Status: Awake alert mild expressive aphasia with nonfluent speech and can speak short sentences.  Marland Kitchen  Speech is severely dysarthric with very minimal verbal responses to examiner.  Does not participate in naming objects or repeating phrases.   Patient has a slight left gaze  preference but will occasionally track right of midline.  Right hemianopia. Right facial droop.  Good antigravity movements on the left.  Right hemiplegia is present with minimal movement without gravity on the right.  Gait deferred due to bilateral chronic BKA.  ASSESSMENT/PLAN Acute Ischemic Infarct: Acute infarct of the left basal ganglia and insula with punctate foci that extend to the subcortical white matter of the left frontal and parietal lobes. Etiology:  Cardioembolic with findings of large mobile clots in the LA, RV, and LV on 2d echo.  CT head: Left MCA territory infarct affecting the insula, caudate nucleus, lentiform nucleus, and internal capsule.  Aspects is 6.  Asymmetric hyperdensity of the M1/proximal M2 left MCA suspicious for the presence of thrombus.  A CTA of the head/neck is recommended for further evaluation.  Background parenchymal atrophy, chronic small vessel ischemic disease and chronic infarcts, as described. CTA head: The common carotid and internal carotid arteries are patent within the neck.  Atherosclerotic plaque bilaterally, as described.  Most notably, there is a severe >90% stenosis at the right ICA origin which is similar to the prior CTA of 02/03/2022.  The vertebral arteries are patent in the neck.  Progressive severe stenosis of the left vertebral artery proximal V1 segment.  Aortic atherosclerosis and emphysema. CTA head: Acute occlusion of the proximal M2 left MCA vessel.  Background intracranial atherosclerotic disease with multifocal stenoses, most notably as follows.  Severe near occlusive stenosis of the intracranial left vertebral artery new from prior CTA 02/03/2022.  Severe stenosis of a right posterior cerebral artery branch at the P3/P4 junction, also new from the prior exam. CT perfusion: The perfusion solver identifies regions of hypoperfusion totaling 117 mL.  These foci are predominantly located within the left MCA vascular territory, although some foci are  present within the right cerebral watershed territory.  The perfusion solver identifies no core infarct.  However, an acute infarct was demonstrated on the noncontrast head CT performed earlier today, as described.  The perfusion software reports a mismatch volume of 117 mL.  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 MRI brain: Acute infarct of the left basal ganglia and insula with punctate foci that extend to the subcortical white matter of the left frontal and parietal lobes.  No hemorrhage or mass effect.  Multiple old cerebellar infarcts and findings of chronic small vessel ischemia. 2D Echo there is a large mobile clot in the LV that appears to be attached to the interventricular septum just under the mitral valve.  LVEF <20%.  The left ventricle has severely decreased function with global hypokinesis.  There is a small RV  apical clot.  Right ventricular systolic function is severely reduced.  There is a larger mobile clot in the LA.  Right and left atria are mildly dilated. 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  Cardiomyopathy HFrEF Intracardiac thrombi  NSVT overnight  TTE EF < 20% Likely cardiac myopathy Large mobile clot in the LV Small RV apical clot, larger mobile clot in the LA On heparin drip Hypercoagulable work up ordered, pending  Prescribed home carvedilol, torsemide, Bidil per cardiology Concern for chronic medication non-adherence Cardiology on board  Hypertension Home meds: Carvedilol, torsemide, Bidil   Stable Blood pressure goal: gradual normotension  Hyperlipidemia Home meds:  None LDL 71, goal < 70 Rosuvastatin 5 mg daily added by cardiology High intensity statin not indicated as patient is near goal without current medications Continue statin at discharge  Diabetes type II Controlled Hypoglycemia during admission Home meds:   HgbA1c 5.8, goal < 7.0 CBGs q4h Dextrose amp for hypoglycemia Will need tube feed initiation via NGT if fails swallow study for speech therapy Coretrack placement on Monday 03/07/24 as needed if patient does not pass swallow evaluation  Tobacco Abuse Remote tobacco use reported      Ready to quit? N/A  Substance Abuse Patient has a history of cocaine use UDS ordered Reports last cocaine use 2 days PTA      Ready to quit? N/A TOC consult for cessation as patient is able to participate  Dysphagia Patient has post-stroke dysphagia, SLP consulted    Diet   DIET DYS 3 Room service appropriate? Yes with Assist; Fluid consistency: Nectar Thick  Will need Coretrack placement Monday if he is unable to tolerate PO  Attempt NGT today if he does not pass swallow with ST reevaluation   Other Stroke Risk Factors Obesity, Body mass index is 24.42 kg/m., BMI >/= 30 associated with increased stroke risk, recommend weight loss, diet and exercise as appropriate  Questionable alcohol use, level <10 on arrival Coronary artery disease Congestive heart failure  Other Active Problems Schizoaffective disorder, Bipolar 1 disorder, history of SI and suicidal attempts, depression Continue home Abilify COPD Trelegy ellipta at home Home Pulmicort, albuterol Chronic pain On home gabapentin, oxycodone GERD On home protonix Bilateral BKA  Hospital day # 3  Patient  still has expressive aphasia with moderate dysarthria and right hemiparesis.  He has been switched to therapeutic Lovenox yesterday but I believe patient needs to be switched to oral anticoagulation.   Depending on his insurance and affordability this could be generic Xarelto/Pradaxa or Eliquis.  His compliance is likely going to be a problem regardless.  Will ask pharmacist to look into this.  No family at the bedside.    Discussed with Dr. Jacqulyn Bath and Dr. Rennis Golden cardiology.  Greater than 50% time during the study and it was at this point of counseling and coordination of care and discussion with patient and care team and answering questions.     Delia Heady, MD Medical Director [facility] Pager: [phone] 03/07/2024 4:50 PM  To contact Stroke Continuity provider, please refer to [email or web address]. After hours, contact General Neurology

## 2024-03-07 NOTE — Plan of Care (Signed)
   Problem: Education: Goal: Knowledge of disease or condition will improve Outcome: Progressing Goal: Knowledge of secondary prevention will improve (MUST DOCUMENT ALL) Outcome: Progressing Goal: Knowledge of patient specific risk factors will improve (DELETE if not current risk factor) Outcome: Progressing   Problem: Ischemic Stroke/TIA Tissue Perfusion: Goal: Complications of ischemic stroke/TIA will be minimized Outcome: Progressing   Problem: Coping: Goal: Will verbalize positive feelings about self Outcome: Progressing Goal: Will identify appropriate support needs Outcome: Progressing   Problem: Health Behavior/Discharge Planning: Goal: Ability to manage health-related needs will improve Outcome: Progressing Goal: Goals will be collaboratively established with patient/family Outcome: Progressing   Problem: Self-Care: Goal: Ability to participate in self-care as condition permits will improve Outcome: Progressing Goal: Verbalization of feelings and concerns over difficulty with self-care will improve Outcome: Progressing Goal: Ability to communicate needs accurately will improve Outcome: Progressing   Problem: Nutrition: Goal: Risk of aspiration will decrease Outcome: Progressing Goal: Dietary intake will improve Outcome: Progressing   Problem: Education: Goal: Knowledge of General Education information will improve Description: Including pain rating scale, medication(s)/side effects and non-pharmacologic comfort measures Outcome: Progressing   Problem: Health Behavior/Discharge Planning: Goal: Ability to manage health-related needs will improve Outcome: Progressing

## 2024-03-07 NOTE — Progress Notes (Signed)
 Speech Language Pathology Treatment: Dysphagia  Patient Details Name: [name] MRN: [id number] DOB: Oct 26, 1966 Today's Date: 03/07/2024 Time: 1000-1010 SLP Time Calculation (min) (ACUTE ONLY): 10 min  Assessment / Plan / Recommendation Clinical Impression  Pt willing to sit up and interact with SLP, but resistant to following commands. Speech minimally intelligible today. Pt makes no effort to revise with cues. Only too two sips of thin water though SLP tried to explain rationale for observation. He was eager to try solid food and demonstrate success. He is edentulous and confirms no dentures. Pt masticated solid very well with not residue. Ate several grahams and also pudding, no oral residue. Recommend advancing to mechanical soft solids. Continue nectar thick liquids at least another day. If pt tolerates upgraded solids will consider advancing to thin as well. Pt refused MBS again.   HPI HPI: Pt is a 58 y.o. male who presented to ED with stroke like symptoms. Caregiver found him with slurred speech and right sided weakness. MRI (03/04/24) revealed "Acute infarct of the left basal ganglia and insula with punctate foci that extend to the subcortical white matter of the left frontal and parietal lobes. Multiple old cerebellar infarcts and findings of chronic small vessel ischemia". Previous BSE (08/21/22) revealed no s/sx of aspiration or dysphagia with regular diet/thin liquids initiated. 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 PMH: cocaine abuse, schizoaffective d/o, depression, T2DM, HTN, GERD, systolic CHF, COPD, s/p b/l lower extremity amputee, chronic pain.      SLP Plan  Continue with current plan of care;MBS      Recommendations for follow up therapy are one component of a multi-disciplinary discharge planning process, led by the attending physician.  Recommendations may be updated based on patient status, additional functional criteria and insurance authorization.     Recommendations  Diet recommendations: Dysphagia 3 (mechanical soft);Nectar-thick liquid Liquids provided via: Cup;Straw Medication Administration: Whole meds with puree Supervision: Full supervision/cueing for compensatory strategies Compensations: Minimize environmental distractions;Slow rate;Small sips/bites Postural Changes and/or Swallow Maneuvers: Seated upright 90 degrees                  Oral care QID   Frequent or constant Supervision/Assistance Dysphagia, unspecified (R13.10)     Continue with current plan of care;MBS     Cleto Claggett, Riley Nearing  03/07/2024, 10:30 AM

## 2024-03-07 NOTE — Progress Notes (Signed)
 DAILY PROGRESS NOTE   Patient Name: [name] Date of Encounter: 03/07/2024 Cardiologist: Rollene Rotunda, MD  Chief Complaint   No complaints  Patient Profile   [name] is a 58 y.o. male with a hx of cardiomyopathy, CAD, CVA, type II diabetes, amputation who is being seen 03/04/2024 for the evaluation of LV thrombus at the request of Dr. Artis Flock   Subjective   Appears stable overnight-  HR improved in the 70's, less ventricular ectopy. BP improved as well.   Objective   Vitals:   03/07/24 0600 03/07/24 0630 03/07/24 0700 03/07/24 0800  BP: (!) 150/114  (!) 148/109 (!) 130/102  Pulse: 90 73 68 81  Resp: (!) 26 (!) 22 (!) 22 11  Temp:   97.7 F (36.5 C)   TempSrc:   Axillary   SpO2: (!) 76% 100% 100% 100%  Weight:      Height:        Intake/Output Summary (Last 24 hours) at 03/07/2024 7829 Last data filed at 03/07/2024 0600 Gross per 24 hour  Intake 356.17 ml  Output --  Net 356.17 ml   Filed Weights   03/06/24 0500 03/06/24 1100 03/07/24 0500  Weight: 77 kg 77 kg 75 kg    Physical Exam   General appearance: somnolent, but awakens to voice, no concerns Lungs: clear to auscultation bilaterally Heart: regular rate and rhythm Extremities: bilateral BKA Neurologic: Mental status: right sided weakness, mild expressive aphasia  Inpatient Medications    Scheduled Meds:  arformoterol  15 mcg Nebulization BID   ARIPiprazole  5 mg Oral Daily   budesonide (PULMICORT) nebulizer solution  0.5 mg Nebulization BID   carvedilol  3.125 mg Oral BID WC   Chlorhexidine Gluconate Cloth  6 each Topical Daily   enoxaparin (LOVENOX) injection  80 mg Subcutaneous BID   losartan  25 mg Oral Daily   nicotine  14 mg Transdermal Daily   pantoprazole (PROTONIX) IV  40 mg Intravenous Q24H   revefenacin  175 mcg Nebulization Daily   rosuvastatin  5 mg Oral QHS    Continuous Infusions:  dextrose Stopped (03/06/24 1232)    PRN Meds: albuterol, metoprolol tartrate,  oxyCODONE   Labs   Results for orders placed or performed during the hospital encounter of 03/04/24 (from the past 48 hours)  Glucose, capillary     Status: Abnormal   Collection Time: 03/05/24 11:11 AM  Result Value Ref Range   Glucose-Capillary 65 (L) 70 - 99 mg/dL    Comment: Glucose reference range applies only to samples taken after fasting for at least 8 hours.  Urine rapid drug screen (hosp performed)     Status: Abnormal   Collection Time: 03/05/24 11:21 AM  Result Value Ref Range   Opiates POSITIVE (A) NONE DETECTED   Cocaine POSITIVE (A) NONE DETECTED   Benzodiazepines POSITIVE (A) NONE DETECTED   Amphetamines NONE DETECTED NONE DETECTED   Tetrahydrocannabinol NONE DETECTED NONE DETECTED   Barbiturates NONE DETECTED NONE DETECTED    Comment: (NOTE) DRUG SCREEN FOR MEDICAL PURPOSES ONLY.  IF CONFIRMATION IS NEEDED FOR ANY PURPOSE, NOTIFY LAB WITHIN 5 DAYS.  LOWEST DETECTABLE LIMITS FOR URINE DRUG SCREEN Drug Class                     Cutoff (ng/mL) Amphetamine and metabolites    1000 Barbiturate and metabolites    200 Benzodiazepine  200 Opiates and metabolites        300 Cocaine and metabolites        300 THC                            50 Performed at [facility], [address] [place], [name] [address]   Urinalysis, Routine w reflex microscopic -Urine, Clean Catch     Status: Abnormal   Collection Time: 03/05/24 11:22 AM  Result Value Ref Range   Color, Urine AMBER (A) YELLOW    Comment: BIOCHEMICALS MAY BE AFFECTED BY COLOR   APPearance HAZY (A) CLEAR   Specific Gravity, Urine >1.046 (H) 1.005 - 1.030   pH 5.0 5.0 - 8.0   Glucose, UA NEGATIVE NEGATIVE mg/dL   Hgb urine dipstick NEGATIVE NEGATIVE   Bilirubin Urine NEGATIVE NEGATIVE   Ketones, ur 5 (A) NEGATIVE mg/dL   Protein, ur 30 (A) NEGATIVE mg/dL   Nitrite NEGATIVE NEGATIVE   Leukocytes,Ua NEGATIVE NEGATIVE   RBC / HPF 0-5 0 - 5 RBC/hpf   WBC, UA 0-5 0 - 5 WBC/hpf    Bacteria, UA NONE SEEN NONE SEEN   Squamous Epithelial / HPF 0-5 0 - 5 /HPF   Mucus PRESENT    Ca Oxalate Crys, UA PRESENT     Comment: Performed at [facility], [address], [place], [name] [address]  Glucose, capillary     Status: Abnormal   Collection Time: 03/05/24  3:42 PM  Result Value Ref Range   Glucose-Capillary 69 (L) 70 - 99 mg/dL    Comment: Glucose reference range applies only to samples taken after fasting for at least 8 hours.  Heparin level (unfractionated)     Status: Abnormal   Collection Time: 03/05/24  8:06 PM  Result Value Ref Range   Heparin Unfractionated <0.10 (L) 0.30 - 0.70 IU/mL    Comment: (NOTE) The clinical reportable range upper limit is being lowered to >1.10 to align with the FDA approved guidance for the current laboratory assay.  If heparin results are below expected values, and patient dosage has  been confirmed, suggest follow up testing of antithrombin III levels. Performed at [facility], [address], [place], [name] [address]   Glucose, capillary     Status: None   Collection Time: 03/05/24  8:06 PM  Result Value Ref Range   Glucose-Capillary 89 70 - 99 mg/dL    Comment: Glucose reference range applies only to samples taken after fasting for at least 8 hours.  Glucose, capillary     Status: None   Collection Time: 03/06/24 12:12 AM  Result Value Ref Range   Glucose-Capillary 82 70 - 99 mg/dL    Comment: Glucose reference range applies only to samples taken after fasting for at least 8 hours.  Glucose, capillary     Status: None   Collection Time: 03/06/24  3:40 AM  Result Value Ref Range   Glucose-Capillary 88 70 - 99 mg/dL    Comment: Glucose reference range applies only to samples taken after fasting for at least 8 hours.  Glucose, capillary     Status: None   Collection Time: 03/06/24  7:28 AM  Result Value Ref Range   Glucose-Capillary 75 70 - 99 mg/dL    Comment: Glucose reference range applies only to  samples taken after fasting for at least 8 hours.  Glucose, capillary     Status:  Abnormal   Collection Time: 03/06/24 10:41 AM  Result Value Ref Range   Glucose-Capillary 103 (H) 70 - 99 mg/dL    Comment: Glucose reference range applies only to samples taken after fasting for at least 8 hours.  Glucose, capillary     Status: Abnormal   Collection Time: 03/06/24  3:44 PM  Result Value Ref Range   Glucose-Capillary 110 (H) 70 - 99 mg/dL    Comment: Glucose reference range applies only to samples taken after fasting for at least 8 hours.  Glucose, capillary     Status: Abnormal   Collection Time: 03/06/24  8:00 PM  Result Value Ref Range   Glucose-Capillary 160 (H) 70 - 99 mg/dL    Comment: Glucose reference range applies only to samples taken after fasting for at least 8 hours.  Glucose, capillary     Status: Abnormal   Collection Time: 03/06/24 11:50 PM  Result Value Ref Range   Glucose-Capillary 163 (H) 70 - 99 mg/dL    Comment: Glucose reference range applies only to samples taken after fasting for at least 8 hours.    ECG   N/A  Telemetry   Sinus rhythm with infrequent PVC's - Personally Reviewed  Radiology    No results found.   Cardiac Studies   See echo above  Assessment   Principal Problem:   Acute CVA (cerebrovascular accident) [facility]) Active Problems:   Schizoaffective disorder, bipolar type (HCC)   Cocaine abuse (HCC)   Type 2 diabetes mellitus (HCC)   Prolonged QT interval   Acid reflux   History of DVT (deep vein thrombosis)   Status post bilateral BKA.   COPD (chronic obstructive pulmonary disease) (HCC)   Chronic systolic CHF (congestive heart failure) (HCC)   Benign essential HTN   Acute thrombus of left ventricle (HCC)   Elevated liver enzymes   Dilated cardiomyopathy (HCC)   Plan   Continues to improve - BP near normal today. HR improved with less ectopy. Continue coreg and low dose losartan. On lovenox, undergoing hypercoag work-up. Then  will need to transition to oral anticoagulant therapy.  Time Spent Directly with Patient:  I have spent a total of 35 minutes with the patient reviewing hospital notes, telemetry, EKGs, labs and examining the patient as well as establishing an assessment and plan that was discussed personally with the patient.  > 50% of time was spent in direct patient care.  Length of Stay:  LOS: 3 days   Chrystie Nose, MD, [facility], FACP  [place]  [facility]  Medical Director of the Advanced Lipid Disorders &  Cardiovascular Risk Reduction Clinic Diplomate of the American Board of Clinical Lipidology Attending Cardiologist  Direct Dial: [phone]  Fax: [phone]  Website:  [email or web address] Nicely Trev Boley 03/07/2024, 9:06 AM

## 2024-03-08 ENCOUNTER — Inpatient Hospital Stay (HOSPITAL_COMMUNITY): Payer: MEDICAID

## 2024-03-08 DIAGNOSIS — I5043 Acute on chronic combined systolic (congestive) and diastolic (congestive) heart failure: Secondary | ICD-10-CM

## 2024-03-08 DIAGNOSIS — M79661 Pain in right lower leg: Secondary | ICD-10-CM

## 2024-03-08 DIAGNOSIS — I240 Acute coronary thrombosis not resulting in myocardial infarction: Secondary | ICD-10-CM | POA: Diagnosis not present

## 2024-03-08 DIAGNOSIS — E11649 Type 2 diabetes mellitus with hypoglycemia without coma: Secondary | ICD-10-CM | POA: Diagnosis not present

## 2024-03-08 DIAGNOSIS — I429 Cardiomyopathy, unspecified: Secondary | ICD-10-CM | POA: Diagnosis not present

## 2024-03-08 DIAGNOSIS — I639 Cerebral infarction, unspecified: Secondary | ICD-10-CM | POA: Diagnosis not present

## 2024-03-08 DIAGNOSIS — I63512 Cerebral infarction due to unspecified occlusion or stenosis of left middle cerebral artery: Secondary | ICD-10-CM | POA: Diagnosis not present

## 2024-03-08 DIAGNOSIS — I513 Intracardiac thrombosis, not elsewhere classified: Secondary | ICD-10-CM | POA: Diagnosis not present

## 2024-03-08 LAB — GLUCOSE, CAPILLARY
Glucose-Capillary: 105 mg/dL — ABNORMAL HIGH (ref 70–99)
Glucose-Capillary: 113 mg/dL — ABNORMAL HIGH (ref 70–99)
Glucose-Capillary: 117 mg/dL — ABNORMAL HIGH (ref 70–99)
Glucose-Capillary: 118 mg/dL — ABNORMAL HIGH (ref 70–99)
Glucose-Capillary: 146 mg/dL — ABNORMAL HIGH (ref 70–99)

## 2024-03-08 LAB — CBC
HCT: 38.3 % — ABNORMAL LOW (ref 39.0–52.0)
Hemoglobin: 12.9 g/dL — ABNORMAL LOW (ref 13.0–17.0)
MCH: 31.2 pg (ref 26.0–34.0)
MCHC: 33.7 g/dL (ref 30.0–36.0)
MCV: 92.7 fL (ref 80.0–100.0)
Platelets: 187 10*3/uL (ref 150–400)
RBC: 4.13 MIL/uL — ABNORMAL LOW (ref 4.22–5.81)
RDW: 16.6 % — ABNORMAL HIGH (ref 11.5–15.5)
WBC: 5.6 10*3/uL (ref 4.0–10.5)
nRBC: 0 % (ref 0.0–0.2)

## 2024-03-08 MED ORDER — ENSURE ENLIVE PO LIQD
237.0000 mL | Freq: Two times a day (BID) | ORAL | Status: DC
  Administered 2024-03-12 – 2024-04-05 (×29): 237 mL via ORAL

## 2024-03-08 MED ORDER — CARVEDILOL 6.25 MG PO TABS
6.2500 mg | ORAL_TABLET | Freq: Two times a day (BID) | ORAL | Status: DC
  Administered 2024-03-08 – 2024-04-27 (×76): 6.25 mg via ORAL
  Filled 2024-03-08 (×88): qty 1

## 2024-03-08 NOTE — Progress Notes (Signed)
 Arrived to [facility] [id number] as a transfer from 4N ICU.  Vitals obtained and placed on telemetry monitor.     03/07/24 1838 03/07/24 1841  Vitals  Temp 98.1 F (36.7 C)  --   Temp Source Oral  --   BP (!) 132/97  --   MAP (mmHg) 108  --   BP Location Right Arm  --   BP Method Automatic  --   Patient Position (if appropriate) Lying  --   Pulse Rate 63  --   Pulse Rate Source Monitor  --   Resp 19  --   MEWS COLOR  MEWS Score Color Green  --   Oxygen Therapy  SpO2 100 %  --   O2 Device Nasal Cannula  --   ECG Monitoring  Telemetry Box Number  --  [id number]  Tele Box Verification Completed by Second Verifier  --  Completed Gareth Morgan NT)

## 2024-03-08 NOTE — Progress Notes (Addendum)
 Patient is refusing midnight vital signs and all his glucose checks, stating he will do them in the morning. He becomes angry and agitated when staff attempt to explain the importance of completing these tasks as scheduled. This behavior reflects ongoing non-compliance with medical care. Dr. Antionette Char was notified

## 2024-03-08 NOTE — Plan of Care (Signed)
  Problem: Education: Goal: Knowledge of disease or condition will improve Outcome: Not Progressing Goal: Knowledge of secondary prevention will improve (MUST DOCUMENT ALL) Outcome: Not Progressing Goal: Knowledge of patient specific risk factors will improve (DELETE if not current risk factor) Outcome: Not Progressing   Problem: Ischemic Stroke/TIA Tissue Perfusion: Goal: Complications of ischemic stroke/TIA will be minimized Outcome: Progressing   Problem: Coping: Goal: Will verbalize positive feelings about self Outcome: Progressing Goal: Will identify appropriate support needs Outcome: Not Progressing   Problem: Health Behavior/Discharge Planning: Goal: Ability to manage health-related needs will improve Outcome: Not Progressing Goal: Goals will be collaboratively established with patient/family Outcome: Not Progressing   Problem: Self-Care: Goal: Ability to participate in self-care as condition permits will improve Outcome: Not Progressing Goal: Verbalization of feelings and concerns over difficulty with self-care will improve Outcome: Progressing Goal: Ability to communicate needs accurately will improve Outcome: Progressing   Problem: Nutrition: Goal: Risk of aspiration will decrease Outcome: Progressing Goal: Dietary intake will improve Outcome: Progressing   Problem: Education: Goal: Knowledge of General Education information will improve Description: Including pain rating scale, medication(s)/side effects and non-pharmacologic comfort measures Outcome: Progressing   Problem: Clinical Measurements: Goal: Ability to maintain clinical measurements within normal limits will improve Outcome: Progressing Goal: Will remain free from infection Outcome: Progressing Goal: Diagnostic test results will improve Outcome: Progressing Goal: Respiratory complications will improve Outcome: Progressing Goal: Cardiovascular complication will be avoided Outcome:  Progressing   Problem: Nutrition: Goal: Adequate nutrition will be maintained Outcome: Progressing   Problem: Coping: Goal: Level of anxiety will decrease Outcome: Progressing   Problem: Elimination: Goal: Will not experience complications related to bowel motility Outcome: Progressing Goal: Will not experience complications related to urinary retention Outcome: Progressing   Problem: Pain Managment: Goal: General experience of comfort will improve and/or be controlled Outcome: Progressing   Problem: Safety: Goal: Ability to remain free from injury will improve Outcome: Progressing   Problem: Skin Integrity: Goal: Risk for impaired skin integrity will decrease Outcome: Progressing

## 2024-03-08 NOTE — Progress Notes (Signed)
 PROGRESS NOTE    [name]  OZH:[id number] DOB: September 20, 1966 DOA: 03/04/2024 PCP: Georganna Skeans, MD   Brief Narrative:  58 year old man with cardiomyopathy and active cocaine use brought in as a code stroke by EMS.  His caregiver Inetta Fermo noted slurred speech and confusion.  Right arm and right leg weakness was noted by EMS neurology evaluation showed global aphasia and dysarthria with right facial droop, NIHSS was 20 Head CT showed acute left MCA infarct CTA head and neck showed left M2 occlusion Echo showed large mobile LV thrombus, LA thrombus and RV apical thrombus, EF was less than 20%.  Neurology and cardiology consulting and due to high risk of further emboli, decision made to anticoagulate and admit to ICU.  LV thrombus is more than 3 cm in size.  He was initially admitted under hospital service, transferred under ICU same day on 03/04/2024.  transferred under [facility] care again on 03/07/2024.   Assessment & Plan:   Principal Problem:   Acute CVA (cerebrovascular accident) (HCC) Active Problems:   Acute thrombus of left ventricle (HCC)   Chronic systolic CHF (congestive heart failure) (HCC)   Prolonged QT interval   Elevated liver enzymes   COPD (chronic obstructive pulmonary disease) (HCC)   Cocaine abuse (HCC)   Schizoaffective disorder, bipolar type (HCC)   Benign essential HTN   Acid reflux   History of DVT (deep vein thrombosis)   Type 2 diabetes mellitus (HCC)   Status post bilateral BKA.   Dilated cardiomyopathy (HCC)  Acute left MCA CVA/hyperlipidemia:  CT head: Left MCA territory infarct affecting the insula, caudate nucleus, lentiform nucleus, and internal capsule.  Aspects is 6.  Asymmetric hyperdensity of the M1/proximal M2 left MCA suspicious for the presence of thrombus.  A CTA of the head/neck is recommended for further evaluation.  Background parenchymal atrophy, chronic small vessel ischemic disease and chronic infarcts, as described. CTA head: The common carotid and  internal carotid arteries are patent within the neck.  Atherosclerotic plaque bilaterally, as described.  Most notably, there is a severe >90% stenosis at the right ICA origin which is similar to the prior CTA of 02/03/2022.  The vertebral arteries are patent in the neck.  Progressive severe stenosis of the left vertebral artery proximal V1 segment.  Aortic atherosclerosis and emphysema. CTA head: Acute occlusion of the proximal M2 left MCA vessel.  Background intracranial atherosclerotic disease with multifocal stenoses, most notably as follows.  Severe near occlusive stenosis of the intracranial left vertebral artery new from prior CTA 02/03/2022.  Severe stenosis of a right posterior cerebral artery branch at the P3/P4 junction, also new from the prior exam. CT perfusion: The perfusion solver identifies regions of hypoperfusion totaling 117 mL.  These foci are predominantly located within the left MCA vascular territory, although some foci are present within the right cerebral watershed territory.  The perfusion solver identifies no core infarct.  However, an acute infarct was demonstrated on the noncontrast head CT performed earlier today, as described.  The perfusion software reports a mismatch volume of 117 mL.  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 MRI brain: Acute infarct of the left basal ganglia and insula with punctate foci that extend to the subcortical white matter of the left frontal and parietal lobes.  No hemorrhage or mass effect.  Multiple old cerebellar infarcts and findings of chronic small vessel ischemia. 2D Echo there is a  large mobile clot in the LV that appears to be attached to the interventricular septum just under the mitral valve.  LVEF <20%.  The left ventricle has severely decreased function with global hypokinesis.  There is a small RV apical clot.  Right  ventricular systolic function is severely reduced.  There is a larger mobile clot in the LA.  Right and left atria are mildly dilated. LDL 71-on Crestor 5 mg HgbA1c 5.8 VTE prophylaxis-Eliquis No antithrombotic prior to admission, now on Eliquis for intracardiac thrombus.  Dysphagia: Finally passed swallow evaluation, started on dysphagia 1 diet for 03/06/2024.  Advanced to dysphagia 3 03/07/2024.  Respiratory insufficiency: Afternoon of 03/07/2024, patient was noted to have respiratory insufficiency and hypoxia requiring 4 L of oxygen.  Chest x-ray was repeated which actually showed improved opacity.  He received breathing treatment and felt better and eventually weaned to 3 L of oxygen.  Complains of shortness of breath but lungs clear to auscultation.  Could be atelectasis.  Incentive spirometry ordered.  Nurse requested to provide and educate the patient.  Patient advised to use every hour.   RV/LA/LV thrombus -high risk embolising, No family available to have risk-benefit discussion.  No evidence of PFO.  Hypercoagulability workup in process.  Patient on Lovenox, neurology recommended DOAC or warfarin, with patient being noncompliant, warfarin is likely not a good option, discussed with pharmacy, co-pay for Eliquis only f $4.  Transitioned to Eliquis.     Hypertension: Now on Coreg.  Blood pressure still elevated at times.   HFrEF -EF 20%.  On Coreg and losartan.  Not on any diuretics.  Cardiology on board and managing, unsure if there is any plans for intervention.   Schizoaffective: Continue Abilify.   COPD -triple nebs during hospitalization Albuterol for rescue   Chronic pain: Continue home dose of oxycodone.  Polysubstance abuse: UDS positive for opiates, cocaine and benzos.  DVT prophylaxis: Eliquis   Code Status: Full Code  Family Communication:  None present at bedside.  Plan of care discussed with patient in length and he/she verbalized understanding and agreed with it.  Status  is: Inpatient Remains inpatient appropriate because: Pending clearance from cardiology   Estimated body mass index is 26.01 kg/m as calculated from the following:   Height as of this encounter: 5\' 9"  (1.753 m).   Weight as of this encounter: 79.9 kg.    Nutritional Assessment: Body mass index is 26.01 kg/m.Marland Kitchen Seen by dietician.  I agree with the assessment and plan as outlined below: Nutrition Status:        . Skin Assessment: I have examined the patient's skin and I agree with the wound assessment as performed by the wound care RN as outlined below:    Consultants:  Cardiology and neurology  Procedures:  As above  Antimicrobials:  Anti-infectives (From admission, onward)    None         Subjective: Patient seen and examined.  Appears comfortable in the bed but complains of shortness of breath.  No other complaint.  Still with significant aphasia but slightly more comprehensible today.  Objective: Vitals:   03/07/24 1838 03/07/24 2001 03/08/24 0319 03/08/24 0500  BP: (!) 132/97 (!) 137/90 139/89   Pulse: 63 69 78   Resp: 19 18 18    Temp: 98.1 F (36.7 C) 98.2 F (36.8 C) 98.1 F (36.7 C)   TempSrc: Oral Oral Oral   SpO2: 100% 96% 99%   Weight:    79.9 kg  Height:  Intake/Output Summary (Last 24 hours) at 03/08/2024 0734 Last data filed at 03/08/2024 0500 Gross per 24 hour  Intake --  Output 550 ml  Net -550 ml   Filed Weights   03/06/24 1100 03/07/24 0500 03/08/24 0500  Weight: 77 kg 75 kg 79.9 kg    Examination:  General exam: Appears calm and comfortable  Respiratory system: Clear to auscultation. Respiratory effort normal. Cardiovascular system: S1 & S2 heard, RRR. No JVD, murmurs, rubs, gallops or clicks. No pedal edema. Gastrointestinal system: Abdomen is nondistended, soft and nontender. No organomegaly or masses felt. Normal bowel sounds heard. Central nervous system: Alert and appears to be oriented.  Expressive aphasia, right  hemianopia, right facial droop right hemiplegia. Skin/extremities: No rashes, lesions or ulcers, bilateral BKA   Data Reviewed: I have personally reviewed following labs and imaging studies  CBC: Recent Labs  Lab 03/04/24 1436 03/05/24 0609 03/07/24 1009 03/08/24 0533  WBC 6.6 6.4 5.4 5.6  NEUTROABS 4.2  --   --   --   HGB 14.3  15.0 13.9 13.9 12.9*  HCT 43.8  44.0 42.3 42.3 38.3*  MCV 93.8 93.4 93.2 92.7  PLT 210 229 199 187   Basic Metabolic Panel: Recent Labs  Lab 03/04/24 1436 03/04/24 1621 03/05/24 0609 03/07/24 1009  NA 138  141  --  142 140  K 3.7  3.6  --  3.7 4.0  CL 108  109  --  112* 109  CO2 19*  --  19* 22  GLUCOSE 136*  136*  --  74 100*  BUN 17  19  --  13 15  CREATININE 1.04  1.00  --  1.08 0.99  CALCIUM 7.9*  --  8.3* 8.6*  MG  --  1.3*  --   --    GFR: Estimated Creatinine Clearance: 84.3 mL/min (by C-G formula based on SCr of 0.99 mg/dL). Liver Function Tests: Recent Labs  Lab 03/04/24 1436  AST 59*  ALT 87*  ALKPHOS 67  BILITOT 2.4*  PROT 5.7*  ALBUMIN 2.9*   No results for input(s): "LIPASE", "AMYLASE" in the last 168 hours. No results for input(s): "AMMONIA" in the last 168 hours. Coagulation Profile: Recent Labs  Lab 03/04/24 1436  INR 1.5*   Cardiac Enzymes: No results for input(s): "CKTOTAL", "CKMB", "CKMBINDEX", "TROPONINI" in the last 168 hours. BNP (last 3 results) No results for input(s): "PROBNP" in the last 8760 hours. HbA1C: No results for input(s): "HGBA1C" in the last 72 hours. CBG: Recent Labs  Lab 03/06/24 1041 03/06/24 1544 03/06/24 2000 03/06/24 2350 03/07/24 1021  GLUCAP 103* 110* 160* 163* 123*   Lipid Profile: No results for input(s): "CHOL", "HDL", "LDLCALC", "TRIG", "CHOLHDL", "LDLDIRECT" in the last 72 hours.  Thyroid Function Tests: No results for input(s): "TSH", "T4TOTAL", "FREET4", "T3FREE", "THYROIDAB" in the last 72 hours. Anemia Panel: No results for input(s): "VITAMINB12",  "FOLATE", "FERRITIN", "TIBC", "IRON", "RETICCTPCT" in the last 72 hours. Sepsis Labs: No results for input(s): "PROCALCITON", "LATICACIDVEN" in the last 168 hours.  Recent Results (from the past 240 hours)  MRSA Next Gen by PCR, Nasal     Status: None   Collection Time: 03/04/24 11:10 PM   Specimen: Nasal Mucosa; Nasal Swab  Result Value Ref Range Status   MRSA by PCR Next Gen NOT DETECTED NOT DETECTED Final    Comment: (NOTE) The GeneXpert MRSA Assay (FDA approved for NASAL specimens only), is one component of a comprehensive MRSA colonization surveillance program. It is not  intended to diagnose MRSA infection nor to guide or monitor treatment for MRSA infections. Test performance is not FDA approved in patients less than 30 years old. Performed at [facility], [address], [place], [name] [address]      Radiology Studies: DG CHEST PORT 1 VIEW Result Date: 03/07/2024 CLINICAL DATA:  Pneumonia. EXAM: PORTABLE CHEST 1 VIEW COMPARISON:  Radiograph and CT 01/13/2024 FINDINGS: Unchanged cardiomegaly. Stable mediastinal contours. Improved peripheral opacity in the right lung from prior. No new airspace disease. There is slight increase in peribronchial thickening. No significant effusion. No pneumothorax. IMPRESSION: 1. Improved peripheral opacity in the right lung from prior. No new airspace disease. 2. Slight increase in peribronchial thickening, may be bronchitis or reactive airways disease. 3. Unchanged cardiomegaly. Electronically Signed   By: Narda Rutherford M.D.   On: 03/07/2024 21:34    Scheduled Meds:  apixaban  5 mg Oral BID   arformoterol  15 mcg Nebulization BID   ARIPiprazole  5 mg Oral Daily   budesonide (PULMICORT) nebulizer solution  0.5 mg Nebulization BID   carvedilol  3.125 mg Oral BID WC   Chlorhexidine Gluconate Cloth  6 each Topical Daily   losartan  25 mg Oral Daily   nicotine  14 mg Transdermal Daily   pantoprazole (PROTONIX) IV  40 mg Intravenous Q24H    revefenacin  175 mcg Nebulization Daily   rosuvastatin  5 mg Oral QHS   Continuous Infusions:     LOS: 4 days   Hughie Closs, MD [facility]  03/08/2024, 7:34 AM   *Please note that this is a verbal dictation therefore any spelling or grammatical errors are due to the "[employer] One" system interpretation.  Please page via Amion and do not message via secure chat for urgent patient care matters. Secure chat can be used for non urgent patient care matters.  How to contact the [facility] Attending or Consulting provider 7A - 7P or covering provider during after hours 7P -7A, for this patient?  Check the care team in [facility] and look for a) attending/consulting [facility] provider listed and b) the [facility] team listed. Page or secure chat 7A-7P. Log into [email or web address] and use [place]'s universal password to access. If you do not have the password, please contact the hospital operator. Locate the [facility] provider you are looking for under [facility] and page to a number that you can be directly reached. If you still have difficulty reaching the provider, please page the [facility] (Director on Call) for the Hospitalists listed on amion for assistance.

## 2024-03-08 NOTE — Progress Notes (Signed)
 STROKE TEAM PROGRESS NOTE   BRIEF [name] is a 58 y.o. 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     SIGNIFICANT HOSPITAL EVENTS 4/4:  - Presentation for slurred speech ad right-sided weakness - CT head revealed an acute left MCA territory infarct affecting the insula, caudate nucleus, lentiform nucleus, and internal capsule within aspects of 6.  Also showed hyperdensity of the M1/proximal M2 left MCA.  - CTA showed acute occlusion of the proximal M2 left MCA, severe near occlusive stenosis of the left vertebral artery, severe stenosis of the right posterior cerebral artery branch at P3/P4 junction.  - Patient not a candidate for IR as his last known well was 26 hours PTA with a modified Rankin score of 4 and aspects of 6. - 2D Echo: Severe biventricular dysfunction with large mobile clots in the LA, RV, and LV.  LVEF <20%.  Mild dilation of left and right atria. Patient was started on a heparin gtt and admitted to the ICU   INTERIM HISTORY/SUBJECTIVE Patient is sitting up in bed.  He he appears short of breath as his oxygen is off.  He is restless.  Neurological exam is unchanged with expressive aphasia and slight left gaze preference and right hemiparesis.  OBJECTIVE CBC    Component Value Date/Time   WBC 5.6 03/08/2024 0533   RBC 4.13 (L) 03/08/2024 0533   HGB 12.9 (L) 03/08/2024 0533   HCT 38.3 (L) 03/08/2024 0533   PLT 187 03/08/2024 0533   MCV 92.7 03/08/2024 0533   MCH 31.2 03/08/2024 0533   MCHC 33.7 03/08/2024 0533   RDW 16.6 (H) 03/08/2024 0533   LYMPHSABS 1.6 03/04/2024 1436   MONOABS 0.7  03/04/2024 1436   EOSABS 0.0 03/04/2024 1436   BASOSABS 0.0 03/04/2024 1436   BMET    Component Value Date/Time   NA 140 03/07/2024 1009   K 4.0 03/07/2024 1009   CL 109 03/07/2024 1009   CO2 22 03/07/2024 1009   GLUCOSE 100 (H) 03/07/2024 1009   BUN 15 03/07/2024 1009   CREATININE 0.99 03/07/2024 1009   CALCIUM 8.6 (L) 03/07/2024 1009   GFRNONAA >60 03/07/2024 1009   Lab Results  Component Value Date   CHOL 110 03/05/2024   HDL 29 (L) 03/05/2024   LDLCALC 71 03/05/2024   TRIG 51 03/05/2024   CHOLHDL 3.8 03/05/2024   Lab Results  Component Value Date   HGBA1C 5.8 (H) 01/02/2024   Drugs of Abuse     Component Value Date/Time   LABOPIA POSITIVE (A) 03/05/2024 1121   COCAINSCRNUR POSITIVE (A) 03/05/2024 1121   COCAINSCRNUR POSITIVE (A) 12/25/2016 1533   LABBENZ POSITIVE (A) 03/05/2024 1121   AMPHETMU NONE DETECTED 03/05/2024 1121   THCU NONE DETECTED 03/05/2024 1121   LABBARB NONE DETECTED 03/05/2024 1121    IMAGING past 24 hours DG CHEST PORT 1 VIEW Result Date: 03/07/2024 CLINICAL DATA:  Pneumonia. EXAM: PORTABLE CHEST 1 VIEW COMPARISON:  Radiograph and CT 01/13/2024 FINDINGS: Unchanged cardiomegaly. Stable mediastinal contours. Improved peripheral opacity in the right lung from prior. No new airspace disease. There is slight increase in peribronchial thickening. No significant effusion. No pneumothorax. IMPRESSION: 1. Improved peripheral opacity in  the right lung from prior. No new airspace disease. 2. Slight increase in peribronchial thickening, may be bronchitis or reactive airways disease. 3. Unchanged cardiomegaly. Electronically Signed   By: Narda Rutherford M.D.   On: 03/07/2024 21:34    Vitals:   03/08/24 0745 03/08/24 0746 03/08/24 0748 03/08/24 1212  BP: (!) 137/95 (!) 137/95  (!) 156/92  Pulse: 79 79  82  Resp:      Temp: 99.7 F (37.6 C)   98.1 F (36.7 C)  TempSrc: Oral   Axillary  SpO2: 100%  100% 100%  Weight:      Height:       PHYSICAL  EXAM General:  Acutely ill appearing African American male laying in ICU bed in no acute distress Psych:  Unable to assess, patient is drowsy in the bed with only brief arousal to repeat stimulation. CV: Regular rate and rhythm on monitor Respiratory:  Regular, unlabored respirations on room air GI: Abdomen soft and nontender  NEURO:  Mental Status: Awake alert mild expressive aphasia with nonfluent speech and can speak short sentences.  Marland Kitchen  Speech is severely dysarthric with very minimal verbal responses to examiner.  Does not participate in naming objects or repeating phrases.   Patient has a slight left gaze preference but will occasionally track right of midline.  Right hemianopia. Right facial droop.  Good antigravity movements on the left.  Right hemiplegia is present with minimal movement without gravity on the right.  Gait deferred due to bilateral chronic BKA.  ASSESSMENT/PLAN Acute Ischemic Infarct: Acute infarct of the left basal ganglia and insula with punctate foci that extend to the subcortical white matter of the left frontal and parietal lobes. Etiology:  Cardioembolic with findings of large mobile clots in the LA, RV, and LV on 2d echo.  CT head: Left MCA territory infarct affecting the insula, caudate nucleus, lentiform nucleus, and internal capsule.  Aspects is 6.  Asymmetric hyperdensity of the M1/proximal M2 left MCA suspicious for the presence of thrombus.  A CTA of the head/neck is recommended for further evaluation.  Background parenchymal atrophy, chronic small vessel ischemic disease and chronic infarcts, as described. CTA head: The common carotid and internal carotid arteries are patent within the neck.  Atherosclerotic plaque bilaterally, as described.  Most notably, there is a severe >90% stenosis at the right ICA origin which is similar to the prior CTA of 02/03/2022.  The vertebral arteries are patent in the neck.  Progressive severe stenosis of the left vertebral artery  proximal V1 segment.  Aortic atherosclerosis and emphysema. CTA head: Acute occlusion of the proximal M2 left MCA vessel.  Background intracranial atherosclerotic disease with multifocal stenoses, most notably as follows.  Severe near occlusive stenosis of the intracranial left vertebral artery new from prior CTA 02/03/2022.  Severe stenosis of a right posterior cerebral artery branch at the P3/P4 junction, also new from the prior exam. CT perfusion: The perfusion solver identifies regions of hypoperfusion totaling 117 mL.  These foci are predominantly located within the left MCA vascular territory, although some foci are present within the right cerebral watershed territory.  The perfusion solver identifies no core infarct.  However, an acute infarct was demonstrated on the noncontrast head CT performed earlier today, as described.  The perfusion software reports a mismatch volume of 117 mL.  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 MRI brain: Acute infarct  of the left basal ganglia and insula with punctate foci that extend to the subcortical white matter of the left frontal and parietal lobes.  No hemorrhage or mass effect.  Multiple old cerebellar infarcts and findings of chronic small vessel ischemia. 2D Echo there is a large mobile clot in the LV that appears to be attached to the interventricular septum just under the mitral valve.  LVEF <20%.  The left ventricle has severely decreased function with global hypokinesis.  There is a small RV apical clot.  Right ventricular systolic function is severely reduced.  There is a larger mobile clot in the LA.  Right and left atria are mildly dilated. 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  Cardiomyopathy HFrEF Intracardiac thrombi  NSVT overnight  TTE EF < 20% Likely cardiac myopathy Large mobile clot in the LV Small RV apical clot, larger mobile clot in the LA On heparin drip Hypercoagulable work up ordered, pending  Prescribed home carvedilol, torsemide, Bidil per cardiology Concern for chronic medication non-adherence Cardiology on board  Hypertension Home meds: Carvedilol, torsemide, Bidil  Stable Blood pressure goal: gradual normotension  Hyperlipidemia Home meds:  None LDL 71, goal < 70 Rosuvastatin 5 mg daily added by cardiology High intensity statin not indicated as patient is near goal without current medications Continue statin at discharge  Diabetes type II Controlled Hypoglycemia during admission Home meds:   HgbA1c 5.8, goal < 7.0 CBGs q4h Dextrose amp for hypoglycemia Will need tube feed initiation via NGT if fails swallow study for speech therapy Coretrack placement on Monday 03/07/24 as needed if patient does not pass swallow evaluation  Tobacco Abuse Remote tobacco use reported      Ready to quit? N/A  Substance Abuse Patient has a history of cocaine use UDS ordered Reports last cocaine use 2 days PTA      Ready to quit? N/A TOC consult for cessation as patient is able to participate  Dysphagia Patient has post-stroke dysphagia, SLP consulted    Diet   DIET DYS 3 Room service appropriate? Yes with Assist; Fluid consistency: Nectar Thick  Will need Coretrack placement Monday if he is unable to tolerate PO  Attempt NGT today if he does not pass swallow with ST reevaluation   Other Stroke Risk Factors Obesity, Body mass index is 26.01 kg/m., BMI >/= 30 associated with increased stroke risk, recommend weight loss, diet and exercise as appropriate   Questionable alcohol use, level <10 on arrival Coronary artery disease Congestive heart failure  Other Active Problems Schizoaffective disorder, Bipolar 1 disorder, history of SI and suicidal attempts, depression Continue home Abilify COPD Trelegy ellipta at home Home Pulmicort, albuterol Chronic pain On home gabapentin, oxycodone GERD On home protonix Bilateral BKA  Hospital day # 4 Patient  still has expressive aphasia with moderate dysarthria and right hemiparesis.  He has been switched to Eliquis for oral anticoagulation.  His compliance is likely going to be a problem regardless.  Will ask pharmacist to look into this.  No family at the bedside.    Discussed with Dr. Jacqulyn Bath .  Stroke team will sign off.  Kindly call for questions.  Follow-up with her outpatient stroke clinic in 2 months.  Delia Heady, MD Medical Director [facility] Pager: [phone] 03/08/2024 3:21 PM  To contact Stroke Continuity provider, please refer to [email or web address]. After hours, contact General Neurology

## 2024-03-08 NOTE — Plan of Care (Signed)
 Alert, unable to assess orientation fully. Very garbled speech.   Problem: Education: Goal: Knowledge of disease or condition will improve Outcome: Progressing Goal: Knowledge of secondary prevention will improve (MUST DOCUMENT ALL) Outcome: Progressing Goal: Knowledge of patient specific risk factors will improve (DELETE if not current risk factor) Outcome: Progressing   Problem: Ischemic Stroke/TIA Tissue Perfusion: Goal: Complications of ischemic stroke/TIA will be minimized Outcome: Progressing   Problem: Coping: Goal: Will verbalize positive feelings about self Outcome: Progressing Goal: Will identify appropriate support needs Outcome: Progressing   Problem: Health Behavior/Discharge Planning: Goal: Ability to manage health-related needs will improve Outcome: Progressing Goal: Goals will be collaboratively established with patient/family Outcome: Progressing

## 2024-03-08 NOTE — Discharge Instructions (Addendum)
 Information on my medicine - XARELTO (rivaroxaban)  This medication education was reviewed with me or my healthcare representative as part of my discharge preparation.  The pharmacist that spoke with me during my hospital stay was:  Ariauna Farabee A Deannie Resetar, RPH  WHY WAS XARELTO PRESCRIBED FOR YOU? Xarelto was prescribed to treat blood clots that may have been found in the veins of your legs (deep vein thrombosis) or in your lungs (pulmonary embolism) and to reduce the risk of them occurring again.  What do you need to know about Xarelto? The starting dose is one 20 mg tablet taken ONCE A DAY with your evening meal.  DO NOT stop taking Xarelto without talking to the health care provider who prescribed the medication.  Refill your prescription for 20 mg tablets before you run out.  After discharge, you should have regular check-up appointments with your healthcare provider that is prescribing your Xarelto.  In the future your dose may need to be changed if your kidney function changes by a significant amount.  What do you do if you miss a dose? If you are taking Xarelto TWICE DAILY and you miss a dose, take it as soon as you remember. You may take two 15 mg tablets (total 30 mg) at the same time then resume your regularly scheduled 15 mg twice daily the next day.  If you are taking Xarelto ONCE DAILY and you miss a dose, take it as soon as you remember on the same day then continue your regularly scheduled once daily regimen the next day. Do not take two doses of Xarelto at the same time.   Important Safety Information Xarelto is a blood thinner medicine that can cause bleeding. You should call your healthcare provider right away if you experience any of the following: Bleeding from an injury or your nose that does not stop. Unusual colored urine (red or dark brown) or unusual colored stools (red or black). Unusual bruising for unknown reasons. A serious fall or if you hit your head (even if  there is no bleeding).  Some medicines may interact with Xarelto and might increase your risk of bleeding while on Xarelto. To help avoid this, consult your healthcare provider or pharmacist prior to using any new prescription or non-prescription medications, including herbals, vitamins, non-steroidal anti-inflammatory drugs (NSAIDs) and supplements.  This website has more information on Xarelto: [email or web address].

## 2024-03-08 NOTE — Progress Notes (Signed)
 DAILY PROGRESS NOTE   Patient Name: [name] Date of Encounter: 03/08/2024 Cardiologist: Rollene Rotunda, MD  Chief Complaint   No complaints  Patient Profile   [name] is a 58 y.o. male with a hx of cardiomyopathy, CAD, CVA, type II diabetes, amputation who is being seen 03/04/2024 for the evaluation of LV thrombus at the request of Dr. Artis Flock   Subjective   HR remains in the 60-70's. BP remains elevated.  Objective   Vitals:   03/08/24 0319 03/08/24 0500 03/08/24 0745 03/08/24 0748  BP: 139/89  (!) 137/95   Pulse: 78  79   Resp: 18     Temp: 98.1 F (36.7 C)  99.7 F (37.6 C)   TempSrc: Oral  Oral   SpO2: 99%  100% 100%  Weight:  79.9 kg    Height:        Intake/Output Summary (Last 24 hours) at 03/08/2024 0854 Last data filed at 03/08/2024 0500 Gross per 24 hour  Intake --  Output 550 ml  Net -550 ml   Filed Weights   03/06/24 1100 03/07/24 0500 03/08/24 0500  Weight: 77 kg 75 kg 79.9 kg    Physical Exam   General appearance: somnolent, but awakens to voice, no concerns Lungs: clear to auscultation bilaterally Heart: regular rate and rhythm Extremities: bilateral BKA Neurologic: Mental status: right sided weakness, mild expressive aphasia  Inpatient Medications    Scheduled Meds:  apixaban  5 mg Oral BID   arformoterol  15 mcg Nebulization BID   ARIPiprazole  5 mg Oral Daily   budesonide (PULMICORT) nebulizer solution  0.5 mg Nebulization BID   carvedilol  3.125 mg Oral BID WC   Chlorhexidine Gluconate Cloth  6 each Topical Daily   losartan  25 mg Oral Daily   nicotine  14 mg Transdermal Daily   pantoprazole (PROTONIX) IV  40 mg Intravenous Q24H   revefenacin  175 mcg Nebulization Daily   rosuvastatin  5 mg Oral QHS    Continuous Infusions:    PRN Meds: albuterol, metoprolol tartrate, oxyCODONE   Labs   Results for orders placed or performed during the hospital encounter of 03/04/24 (from the past 48 hours)  Glucose,  capillary     Status: Abnormal   Collection Time: 03/06/24 10:41 AM  Result Value Ref Range   Glucose-Capillary 103 (H) 70 - 99 mg/dL    Comment: Glucose reference range applies only to samples taken after fasting for at least 8 hours.  Glucose, capillary     Status: Abnormal   Collection Time: 03/06/24  3:44 PM  Result Value Ref Range   Glucose-Capillary 110 (H) 70 - 99 mg/dL    Comment: Glucose reference range applies only to samples taken after fasting for at least 8 hours.  Glucose, capillary     Status: Abnormal   Collection Time: 03/06/24  8:00 PM  Result Value Ref Range   Glucose-Capillary 160 (H) 70 - 99 mg/dL    Comment: Glucose reference range applies only to samples taken after fasting for at least 8 hours.  Glucose, capillary     Status: Abnormal   Collection Time: 03/06/24 11:50 PM  Result Value Ref Range   Glucose-Capillary 163 (H) 70 - 99 mg/dL    Comment: Glucose reference range applies only to samples taken after fasting for at least 8 hours.  CBC     Status: Abnormal   Collection Time: 03/07/24 10:09 AM  Result Value Ref Range  WBC 5.4 4.0 - 10.5 K/uL   RBC 4.54 4.22 - 5.81 MIL/uL   Hemoglobin 13.9 13.0 - 17.0 g/dL   HCT 19.1 47.8 - 29.5 %   MCV 93.2 80.0 - 100.0 fL   MCH 30.6 26.0 - 34.0 pg   MCHC 32.9 30.0 - 36.0 g/dL   RDW 62.1 (H) 30.8 - 65.7 %   Platelets 199 150 - 400 K/uL   nRBC 0.0 0.0 - 0.2 %    Comment: Performed at [facility], [address] [place], [name] [address]  Basic metabolic panel     Status: Abnormal   Collection Time: 03/07/24 10:09 AM  Result Value Ref Range   Sodium 140 135 - 145 mmol/L   Potassium 4.0 3.5 - 5.1 mmol/L   Chloride 109 98 - 111 mmol/L   CO2 22 22 - 32 mmol/L   Glucose, Bld 100 (H) 70 - 99 mg/dL    Comment: Glucose reference range applies only to samples taken after fasting for at least 8 hours.   BUN 15 6 - 20 mg/dL   Creatinine, Ser 2.95 0.61 - 1.24 mg/dL   Calcium 8.6 (L) 8.9 - 10.3 mg/dL   GFR,  Estimated >28 >41 mL/min    Comment: (NOTE) Calculated using the CKD-EPI Creatinine Equation (2021)    Anion gap 9 5 - 15    Comment: Performed at [facility], [address] [place], [name] [address]  Glucose, capillary     Status: Abnormal   Collection Time: 03/07/24 10:21 AM  Result Value Ref Range   Glucose-Capillary 123 (H) 70 - 99 mg/dL    Comment: Glucose reference range applies only to samples taken after fasting for at least 8 hours.  CBC     Status: Abnormal   Collection Time: 03/08/24  5:33 AM  Result Value Ref Range   WBC 5.6 4.0 - 10.5 K/uL   RBC 4.13 (L) 4.22 - 5.81 MIL/uL   Hemoglobin 12.9 (L) 13.0 - 17.0 g/dL   HCT 10.2 (L) 72.5 - 36.6 %   MCV 92.7 80.0 - 100.0 fL   MCH 31.2 26.0 - 34.0 pg   MCHC 33.7 30.0 - 36.0 g/dL   RDW 44.0 (H) 34.7 - 42.5 %   Platelets 187 150 - 400 K/uL   nRBC 0.0 0.0 - 0.2 %    Comment: Performed at [facility], [address], [place], [name] [address]  Glucose, capillary     Status: Abnormal   Collection Time: 03/08/24  7:46 AM  Result Value Ref Range   Glucose-Capillary 105 (H) 70 - 99 mg/dL    Comment: Glucose reference range applies only to samples taken after fasting for at least 8 hours.   Comment 1 Notify RN    Comment 2 Document in Chart     ECG   N/A  Telemetry   Sinus rhythm with infrequent PVC's - Personally Reviewed  Radiology    DG CHEST PORT 1 VIEW Result Date: 03/07/2024 CLINICAL DATA:  Pneumonia. EXAM: PORTABLE CHEST 1 VIEW COMPARISON:  Radiograph and CT 01/13/2024 FINDINGS: Unchanged cardiomegaly. Stable mediastinal contours. Improved peripheral opacity in the right lung from prior. No new airspace disease. There is slight increase in peribronchial thickening. No significant effusion. No pneumothorax. IMPRESSION: 1. Improved peripheral opacity in the right lung from prior. No new airspace disease. 2. Slight increase in peribronchial thickening, may be bronchitis or reactive airways disease. 3.  Unchanged cardiomegaly. Electronically Signed  By: Narda Rutherford M.D.   On: 03/07/2024 21:34     Cardiac Studies   See echo above  Assessment   Principal Problem:   Acute CVA (cerebrovascular accident) [facility]) Active Problems:   Schizoaffective disorder, bipolar type (HCC)   Cocaine abuse (HCC)   Type 2 diabetes mellitus (HCC)   Prolonged QT interval   Acid reflux   History of DVT (deep vein thrombosis)   Status post bilateral BKA.   COPD (chronic obstructive pulmonary disease) (HCC)   Chronic systolic CHF (congestive heart failure) (HCC)   Benign essential HTN   Acute thrombus of left ventricle (HCC)   Elevated liver enzymes   Dilated cardiomyopathy (HCC)   Plan   There is room to increase GDMT - will increase coreg to 6.25 mg BID. Continue losartan 25 mg daily. He has transitioned to Eliquis 5 mg BID for LV thrombus. Continue Crestor 5 mg at bedtime.   Time Spent Directly with Patient:  I have spent a total of 25 minutes with the patient reviewing hospital notes, telemetry, EKGs, labs and examining the patient as well as establishing an assessment and plan that was discussed personally with the patient.  > 50% of time was spent in direct patient care.  Length of Stay:  LOS: 4 days   Chrystie Nose, MD, [facility], FACP  [place]  [facility]  Medical Director of the [facility] Lipid [facility] &  Cardiovascular Risk Reduction Clinic Diplomate of the American Board of Clinical Lipidology Attending Cardiologist  Direct Dial: [phone]  Fax: [phone]  Website:  [email or web address] Nicely Deiondra Denley 03/08/2024, 8:54 AM

## 2024-03-08 NOTE — Progress Notes (Signed)
 Physical Therapy Treatment Patient Details Name: [name] MRN: [id number] DOB: 08/06/1966 Today's Date: 03/08/2024   History of Present Illness Patient is 58 y.o. male presented to ED on 03/04/24 with stroke like symptoms when caregiver found him with slurred speech and right sided weakness. Head CT reveals Acute left MCA territory infarct affecting the insula, caudate nucleus, lentiform nucleus and internal capsule. PMH significant of  Schizoaffective disorder, heart failure with reduced ejection fraction, pretension, DMII, COPD, s/p bilateral amputations, chronic pain, and polysubstance abuse.    PT Comments  Pt initially agreeable to participation. Once transitioned to EOB, pt immediately returned to supine/R sidelying and refusing further interaction with therapist. He required mod assist supine to sit, and min assist sit to supine. Pt preferring to position himself in R sidelying with head resting on pillow/bed rail. VSS on 4L. PT to continue efforts to mobilize.    If plan is discharge home, recommend the following: Two people to help with walking and/or transfers;Two people to help with bathing/dressing/bathroom;Assistance with cooking/housework;Assistance with feeding;Direct supervision/assist for medications management;Assist for transportation;Help with stairs or ramp for entrance;Supervision due to cognitive status   Can travel by private vehicle     No  Equipment Recommendations  Wheelchair cushion (measurements PT);Wheelchair (measurements PT);Hoyer lift;Hospital bed    Recommendations for Other Services       Precautions / Restrictions Precautions Precautions: Fall;Other (comment) Recall of Precautions/Restrictions: Impaired Precaution/Restrictions Comments: h/o BLE amputations     Mobility  Bed Mobility Overal bed mobility: Needs Assistance Bed Mobility: Supine to Sit, Sit to Supine     Supine to sit: Mod assist Sit to supine: Min assist   General bed mobility  comments: With encouragement, pt agreeable to attempt sitting EOB. Mod assist to elevate trunk then pt immediately returning to R sidelying.    Transfers                   General transfer comment: deferred    Ambulation/Gait                   Stairs             Wheelchair Mobility     Tilt Bed    Modified Rankin (Stroke Patients Only) Modified Rankin (Stroke Patients Only) Pre-Morbid Rankin Score: Slight disability Modified Rankin: Severe disability     Balance Overall balance assessment: Needs assistance Sitting-balance support: Single extremity supported, Feet unsupported Sitting balance-Leahy Scale: Poor                                      Communication Communication Communication: Impaired Factors Affecting Communication: Difficulty expressing self;Reduced clarity of speech  Cognition Arousal: Alert Behavior During Therapy: WFL for tasks assessed/performed, Flat affect   PT - Cognitive impairments: Difficult to assess Difficult to assess due to: Impaired communication                     PT - Cognition Comments: 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 Following commands: Impaired Following commands impaired: Follows one step commands inconsistently, Follows one step commands with increased time    Cueing Cueing Techniques: Verbal cues, Tactile cues, Gestural cues  Exercises      General Comments General comments (skin integrity, edema, etc.): SpO2 97% on 4L, HR in 70s/80s      Pertinent Vitals/Pain Pain Assessment Pain Assessment: Faces Faces Pain Scale: No hurt    Home  Living                          Prior Function            PT Goals (current goals can now be found in the care plan section) Acute Rehab PT Goals Patient Stated Goal: not stated Progress towards PT goals: Progressing toward goals    Frequency    Min 2X/week      PT Plan      Co-evaluation              AM-PAC PT "6 Clicks"  Mobility   Outcome Measure  Help needed turning from your back to your side while in a flat bed without using bedrails?: A Lot Help needed moving from lying on your back to sitting on the side of a flat bed without using bedrails?: A Lot Help needed moving to and from a bed to a chair (including a wheelchair)?: Total Help needed standing up from a chair using your arms (e.g., wheelchair or bedside chair)?: Total Help needed to walk in hospital room?: Total Help needed climbing 3-5 steps with a railing? : Total 6 Click Score: 8    End of Session Equipment Utilized During Treatment: Oxygen Activity Tolerance: Treatment limited secondary to agitation Patient left: in bed;with bed alarm set;with call bell/phone within reach Nurse Communication: Mobility status PT Visit Diagnosis: Other abnormalities of gait and mobility (R26.89);Muscle weakness (generalized) (M62.81);Other symptoms and signs involving the nervous system (R29.898);Hemiplegia and hemiparesis Hemiplegia - Right/Left: Right Hemiplegia - caused by: Cerebral infarction     Time: 5638-7564 PT Time Calculation (min) (ACUTE ONLY): 10 min  Charges:    $Therapeutic Activity: 8-22 mins PT General Charges $$ ACUTE PT VISIT: 1 Visit                     Ferd Glassing., PT  Office # [phone]    Ilda Foil 03/08/2024, 10:16 AM

## 2024-03-09 ENCOUNTER — Inpatient Hospital Stay (HOSPITAL_COMMUNITY): Payer: MEDICAID

## 2024-03-09 ENCOUNTER — Encounter (HOSPITAL_COMMUNITY): Payer: MEDICAID

## 2024-03-09 DIAGNOSIS — I639 Cerebral infarction, unspecified: Secondary | ICD-10-CM | POA: Diagnosis not present

## 2024-03-09 DIAGNOSIS — I63512 Cerebral infarction due to unspecified occlusion or stenosis of left middle cerebral artery: Secondary | ICD-10-CM | POA: Diagnosis not present

## 2024-03-09 DIAGNOSIS — I513 Intracardiac thrombosis, not elsewhere classified: Secondary | ICD-10-CM | POA: Diagnosis not present

## 2024-03-09 DIAGNOSIS — R9431 Abnormal electrocardiogram [ECG] [EKG]: Secondary | ICD-10-CM

## 2024-03-09 DIAGNOSIS — I240 Acute coronary thrombosis not resulting in myocardial infarction: Secondary | ICD-10-CM | POA: Diagnosis not present

## 2024-03-09 DIAGNOSIS — E11649 Type 2 diabetes mellitus with hypoglycemia without coma: Secondary | ICD-10-CM | POA: Diagnosis not present

## 2024-03-09 DIAGNOSIS — I5022 Chronic systolic (congestive) heart failure: Secondary | ICD-10-CM | POA: Diagnosis not present

## 2024-03-09 DIAGNOSIS — I429 Cardiomyopathy, unspecified: Secondary | ICD-10-CM | POA: Diagnosis not present

## 2024-03-09 LAB — CBC
HCT: 40.7 % (ref 39.0–52.0)
Hemoglobin: 13.6 g/dL (ref 13.0–17.0)
MCH: 31.3 pg (ref 26.0–34.0)
MCHC: 33.4 g/dL (ref 30.0–36.0)
MCV: 93.8 fL (ref 80.0–100.0)
Platelets: 189 10*3/uL (ref 150–400)
RBC: 4.34 MIL/uL (ref 4.22–5.81)
RDW: 16.7 % — ABNORMAL HIGH (ref 11.5–15.5)
WBC: 5.5 10*3/uL (ref 4.0–10.5)
nRBC: 0 % (ref 0.0–0.2)

## 2024-03-09 LAB — FACTOR 5 LEIDEN

## 2024-03-09 LAB — HOMOCYSTEINE: Homocysteine: 16.8 umol/L — ABNORMAL HIGH (ref 0.0–14.5)

## 2024-03-09 LAB — GLUCOSE, CAPILLARY
Glucose-Capillary: 108 mg/dL — ABNORMAL HIGH (ref 70–99)
Glucose-Capillary: 109 mg/dL — ABNORMAL HIGH (ref 70–99)
Glucose-Capillary: 175 mg/dL — ABNORMAL HIGH (ref 70–99)
Glucose-Capillary: 74 mg/dL (ref 70–99)
Glucose-Capillary: 79 mg/dL (ref 70–99)
Glucose-Capillary: 97 mg/dL (ref 70–99)

## 2024-03-09 LAB — MAGNESIUM: Magnesium: 1.7 mg/dL (ref 1.7–2.4)

## 2024-03-09 MED ORDER — LOSARTAN POTASSIUM 50 MG PO TABS
50.0000 mg | ORAL_TABLET | Freq: Every day | ORAL | Status: DC
  Administered 2024-03-10 – 2024-05-02 (×33): 50 mg via ORAL
  Filled 2024-03-09 (×54): qty 1

## 2024-03-09 MED ORDER — MAGNESIUM SULFATE 2 GM/50ML IV SOLN
2.0000 g | Freq: Once | INTRAVENOUS | Status: AC
  Administered 2024-03-09: 2 g via INTRAVENOUS
  Filled 2024-03-09: qty 50

## 2024-03-09 MED ORDER — SPIRONOLACTONE 12.5 MG HALF TABLET
12.5000 mg | ORAL_TABLET | Freq: Every day | ORAL | Status: DC
Start: 2024-03-09 — End: 2024-05-03
  Administered 2024-03-09 – 2024-04-27 (×30): 12.5 mg via ORAL
  Filled 2024-03-09 (×47): qty 1

## 2024-03-09 NOTE — Progress Notes (Signed)
 Progress Note  Patient Name: [name] Date of Encounter: 03/09/2024  Primary Cardiologist: Rollene Rotunda, MD  Subjective   Continues to have expressive aphasia but is able to deny CP or SOB. Calm.  Inpatient Medications    Scheduled Meds:  apixaban  5 mg Oral BID   arformoterol  15 mcg Nebulization BID   ARIPiprazole  5 mg Oral Daily   budesonide (PULMICORT) nebulizer solution  0.5 mg Nebulization BID   carvedilol  6.25 mg Oral BID WC   feeding supplement  237 mL Oral BID BM   losartan  25 mg Oral Daily   nicotine  14 mg Transdermal Daily   pantoprazole (PROTONIX) IV  40 mg Intravenous Q24H   revefenacin  175 mcg Nebulization Daily   rosuvastatin  5 mg Oral QHS   Continuous Infusions:  PRN Meds: albuterol, oxyCODONE   Vital Signs    Vitals:   03/09/24 0334 03/09/24 0500 03/09/24 0743 03/09/24 0808  BP: (!) 153/107  (!) 157/116   Pulse: 74  72 77  Resp: 20  18 18   Temp: 97.8 F (36.6 C)  (!) 97.5 F (36.4 C)   TempSrc: Oral  Axillary   SpO2: 100%  100% 99%  Weight:  78.7 kg    Height:        Intake/Output Summary (Last 24 hours) at 03/09/2024 0857 Last data filed at 03/09/2024 0600 Gross per 24 hour  Intake 200 ml  Output 700 ml  Net -500 ml      03/09/2024    5:00 AM 03/08/2024    5:00 AM 03/07/2024    5:00 AM  Last 3 Weights  Weight (lbs) 173 lb 8 oz 176 lb 2.4 oz 165 lb 5.5 oz  Weight (kg) 78.7 kg 79.9 kg 75 kg     Telemetry    NSR, occ PVC - Personally Reviewed  Physical Exam   GEN: No acute distress.  HEENT: Normocephalic, atraumatic, sclera non-icteric. Neck: No JVD or bruits. Cardiac: RRR no murmurs, rubs, or gallops.  Respiratory: Clear to auscultation bilaterally. Breathing is unlabored. GI: Soft, nontender, non-distended, BS +x 4. MS: s/p bilateral BKA Extremities: s/p bilateral BKA no acute edema Neuro:  Expressive aphasia noted, slow cadence of speech, right sided weakness Psych: Calm affect  Labs    High Sensitivity Troponin:   No results for input(s): "TROPONINIHS" in the last 720 hours.    Cardiac EnzymesNo results for input(s): "TROPONINI" in the last 168 hours. No results for input(s): "TROPIPOC" in the last 168 hours.   Chemistry Recent Labs  Lab 03/04/24 1436 03/05/24 0609 03/07/24 1009  NA 138  141 142 140  K 3.7  3.6 3.7 4.0  CL 108  109 112* 109  CO2 19* 19* 22  GLUCOSE 136*  136* 74 100*  BUN 17  19 13 15   CREATININE 1.04  1.00 1.08 0.99  CALCIUM 7.9* 8.3* 8.6*  PROT 5.7*  --   --   ALBUMIN 2.9*  --   --   AST 59*  --   --   ALT 87*  --   --   ALKPHOS 67  --   --   BILITOT 2.4*  --   --   GFRNONAA >60 >60 >60  ANIONGAP 11 11 9      Hematology Recent Labs  Lab 03/07/24 1009 03/08/24 0533 03/09/24 0550  WBC 5.4 5.6 5.5  RBC 4.54 4.13* 4.34  HGB 13.9 12.9* 13.6  HCT 42.3 38.3* 40.7  MCV  93.2 92.7 93.8  MCH 30.6 31.2 31.3  MCHC 32.9 33.7 33.4  RDW 16.5* 16.6* 16.7*  PLT 199 187 189    BNPNo results for input(s): "BNP", "PROBNP" in the last 168 hours.   DDimer No results for input(s): "DDIMER" in the last 168 hours.   Radiology    VAS [place] LOWER EXTREMITY VENOUS (DVT) Result Date: 03/08/2024  Lower Venous DVT Study Patient Name:  [name]  Date of Exam:   03/08/2024 Medical Rec #: [id number]       Accession #:    [id number] Date of Birth: 1966/09/19       Patient Gender: M Patient Age:   58 years Exam Location:  [facility] Procedure:      VAS [place] LOWER EXTREMITY VENOUS (DVT) Referring Phys: RAKESH ALVA --------------------------------------------------------------------------------  Indications: Pain.  Limitations: Patient slightly agitate, could not position properly, nor lay still and bilateral amputations. Comparison Study: Previous study on 1.19.2025. Performing Technologist: Fernande Bras  Examination Guidelines: A complete evaluation includes B-mode imaging, spectral Doppler, color Doppler, and power Doppler as needed of all accessible portions of each  vessel. Bilateral testing is considered an integral part of a complete examination. Limited examinations for reoccurring indications may be performed as noted. The reflux portion of the exam is performed with the patient in reverse Trendelenburg.  +---------+---------------+---------+-----------+----------+--------------+ RIGHT    CompressibilityPhasicitySpontaneityPropertiesThrombus Aging +---------+---------------+---------+-----------+----------+--------------+ CFV      Full           Yes      Yes                                 +---------+---------------+---------+-----------+----------+--------------+ SFJ      Full           Yes      Yes                                 +---------+---------------+---------+-----------+----------+--------------+ FV Prox  Partial        Yes      Yes                                 +---------+---------------+---------+-----------+----------+--------------+ FV Mid   Partial        Yes      Yes                                 +---------+---------------+---------+-----------+----------+--------------+ FV DistalPartial        Yes      Yes                                 +---------+---------------+---------+-----------+----------+--------------+ PFV      Full                                                        +---------+---------------+---------+-----------+----------+--------------+ Right AKA.  +---------+---------------+---------+-----------+----------+--------------+ LEFT     CompressibilityPhasicitySpontaneityPropertiesThrombus Aging +---------+---------------+---------+-----------+----------+--------------+ CFV      Full           Yes  Yes                                 +---------+---------------+---------+-----------+----------+--------------+ SFJ      Full           Yes      Yes                                 +---------+---------------+---------+-----------+----------+--------------+ FV Prox  Full                                                         +---------+---------------+---------+-----------+----------+--------------+ FV Mid   Full                                                        +---------+---------------+---------+-----------+----------+--------------+ FV DistalFull                                                        +---------+---------------+---------+-----------+----------+--------------+ PFV      Full                                                        +---------+---------------+---------+-----------+----------+--------------+ POP      Full           No       No                                  +---------+---------------+---------+-----------+----------+--------------+ Left BKA.    Summary: RIGHT: - Findings consistent with age indeterminate deep vein thrombosis involving the right femoral vein.  - No cystic structure found in the popliteal fossa.  LEFT: - There is no evidence of deep vein thrombosis in the lower extremity.  - No cystic structure found in the popliteal fossa.  *See table(s) above for measurements and observations. Electronically signed by Lemar Livings MD on 03/08/2024 at 7:48:30 PM.    Final    DG CHEST PORT 1 VIEW Result Date: 03/07/2024 CLINICAL DATA:  Pneumonia. EXAM: PORTABLE CHEST 1 VIEW COMPARISON:  Radiograph and CT 01/13/2024 FINDINGS: Unchanged cardiomegaly. Stable mediastinal contours. Improved peripheral opacity in the right lung from prior. No new airspace disease. There is slight increase in peribronchial thickening. No significant effusion. No pneumothorax. IMPRESSION: 1. Improved peripheral opacity in the right lung from prior. No new airspace disease. 2. Slight increase in peribronchial thickening, may be bronchitis or reactive airways disease. 3. Unchanged cardiomegaly. Electronically Signed   By: Narda Rutherford M.D.   On: 03/07/2024 21:34    Cardiac Studies   2d echo 03/04/24   1. The apex has prominen  trabecualtions. There is a large mobile clot in  the LV that appears to be attached to the interventricular septum just  under the mitral valve. . Left ventricular ejection fraction, by  estimation, is <20%. The left ventricle has  severely decreased function. The left ventricle demonstrates global  hypokinesis. Left ventricular diastolic function could not be evaluated.   2. There is a small RV apical clot. Right ventricular systolic function  is severely reduced. The right ventricular size is normal.   3. There is a largr mobile clot in the LA. Left atrial size was mildly  dilated.   4. Right atrial size was mildly dilated.   5. The mitral valve is normal in structure. Trivial mitral valve  regurgitation. No evidence of mitral stenosis.   6. The aortic valve is normal in structure. Aortic valve regurgitation is  not visualized. No aortic stenosis is present.   Conclusion(s)/Recommendation(s): Severe biventricular dysfunction with  large mobile clots in the LA, RV and LV as described above.    Patient Profile     58 y.o. male with CAD (moderate by cath 2020, moderate-severe by coronary CTA in 03/2020 and patient left AMA before further workup completed), chronic HFrEF, CVA 12/2020, HTN, DM-2, COPD/asthma, schizoaffective disorder, polysubstance abuse, non compliance, prolonged QT, depression, bilateral LE amputee, tobacco abuse, chronic pain, polysubstance abuse including cocaine use presented with stroke and LV thrombus. Not a candidate for IR since LKW was 26 hours prior to admission. 2D echo showed EF <20% with large mobile LV clots in the LV and LA as well as small RV apical clot. Duplex also c/w RLE DVT. Hypercoag panel under way.  Assessment & Plan    1. Acute CVA found to have LV, LA, RA clots with RLE DVT as well - previously on parenteral anticoagulation, transitioned to Eliquis per neurology team (warfarin felt poor option given compliance concerns) - hypercoag workup also in  progress, will defer to primary teams for interpretation  2. Chronic HFrEF - volume status looks OK - current GDMT: losartan 25mg  daily, carvedilol 6.25mg  BID - patient advised to abstain from cocaine use - will discuss additional titration with MD, 4 days out from initial presentation - previously intended to be on Bidil, torsemide as well  3. Moderate-severe CAD by cor CT 2021 - not a candidate for invasive evaluation given substance abuse and compliance issues - denies CP - no ASA with concomitant Eliquis - Dr. Rennis Golden had recommended starting rosuvastatin 5mg  daily this admission  4. HTN - manage in context above  5. Polysubstance abuse - active ongoing cocaine use with high risk for morbidity/mortality with going use  6. Prolonged QT interval, NSVT, occasional PVCs - avoid QT prolonging medications - no further NSVT on telemetry - will order f/u EKG to assess QT interval, care order placed to notify when complete - Mg 1.3 earlier this admission s/p repletion, order trends  Remainder per primary  For questions or updates, please contact [facility] Please consult [email or web address] for contact info under Cardiology/STEMI.  Signed, Laurann Montana, PA-C 03/09/2024, 8:57 AM

## 2024-03-09 NOTE — Progress Notes (Addendum)
 Speech Language Pathology Treatment: Dysphagia;Cognitive-Linquistic  Patient Details Name: Davis Ambrosini MRN: [id number] DOB: 01/16/1966 Today's Date: 03/09/2024 Time: 7253-6644 SLP Time Calculation (min) (ACUTE ONLY): 10 min  Assessment / Plan / Recommendation Clinical Impression  Pt willing to accept sips of water and coke, but unwilling to follow any strategies or reposition. Pt is impulsive, no interest in safety or compensations. Improved lung health is a goal for MD right now, trying to wean pt off oxygen. Given behavior and pt unwilling to participate in MBS, recommend ongoing use of thickened liquid for a time. Pt has congested coughing after large consecutive sips that may be associated with aspiration, or may not. Will continue more conservative liquid texture at this time.   Attempted to address language today as pt with overt paraphasias and struggle to find words. Pt hesitating and groping when responding to simple questions. Very poorly intelligible. Pt counted to 10, but wold not work further on naming or word finding. Instantly refused.    HPI HPI: Pt is a 58 y.o. male who presented to ED with stroke like symptoms. Caregiver found him with slurred speech and right sided weakness. MRI (03/04/24) revealed "Acute infarct of the left basal ganglia and insula with punctate foci that extend to the subcortical white matter of the left frontal and parietal lobes. Multiple old cerebellar infarcts and findings of chronic small vessel ischemia". Previous BSE (08/21/22) revealed no s/sx of aspiration or dysphagia with regular diet/thin liquids initiated. 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 PMH: cocaine abuse, schizoaffective d/o, depression, T2DM, HTN, GERD, systolic CHF, COPD, s/p b/l lower extremity amputee, chronic pain.      SLP Plan  Continue with current plan of care      Recommendations for follow up therapy are one component of a multi-disciplinary discharge  planning process, led by the attending physician.  Recommendations may be updated based on patient status, additional functional criteria and insurance authorization.    Recommendations  Diet recommendations: Dysphagia 3 (mechanical soft);Nectar-thick liquid Liquids provided via: Cup;Straw Medication Administration: Whole meds with puree Supervision: Full supervision/cueing for compensatory strategies Compensations: Minimize environmental distractions;Slow rate;Small sips/bites Postural Changes and/or Swallow Maneuvers: Seated upright 90 degrees                  Oral care QID           Continue with current plan of care     Kiet Geer, Riley Nearing  03/09/2024, 1:30 PM

## 2024-03-09 NOTE — Progress Notes (Signed)
 PROGRESS NOTE    [name]  ZOX:[id number] DOB: 24-Sep-1966 DOA: 03/04/2024 PCP: Georganna Skeans, MD   Brief Narrative:  58 year old man with cardiomyopathy and active cocaine use brought in as a code stroke by EMS.  His caregiver Inetta Fermo noted slurred speech and confusion.  Right arm and right leg weakness was noted by EMS neurology evaluation showed global aphasia and dysarthria with right facial droop, NIHSS was 20 Head CT showed acute left MCA infarct CTA head and neck showed left M2 occlusion Echo showed large mobile LV thrombus, LA thrombus and RV apical thrombus, EF was less than 20%.  Neurology and cardiology consulting and due to high risk of further emboli, decision made to anticoagulate and admit to ICU.  LV thrombus is more than 3 cm in size.  He was initially admitted under hospital service, transferred under ICU same day on 03/04/2024.  transferred under [facility] care again on 03/07/2024.   Assessment & Plan:   Principal Problem:   Acute CVA (cerebrovascular accident) (HCC) Active Problems:   Acute thrombus of left ventricle (HCC)   Chronic systolic CHF (congestive heart failure) (HCC)   Prolonged QT interval   Elevated liver enzymes   COPD (chronic obstructive pulmonary disease) (HCC)   Cocaine abuse (HCC)   Schizoaffective disorder, bipolar type (HCC)   Benign essential HTN   Acid reflux   History of DVT (deep vein thrombosis)   Type 2 diabetes mellitus (HCC)   Status post bilateral BKA.   Dilated cardiomyopathy (HCC)  Acute left MCA CVA/hyperlipidemia:  CT head: Left MCA territory infarct affecting the insula, caudate nucleus, lentiform nucleus, and internal capsule.  Aspects is 6.  Asymmetric hyperdensity of the M1/proximal M2 left MCA suspicious for the presence of thrombus.  A CTA of the head/neck is recommended for further evaluation.  Background parenchymal atrophy, chronic small vessel ischemic disease and chronic infarcts, as described. CTA head: The common carotid and  internal carotid arteries are patent within the neck.  Atherosclerotic plaque bilaterally, as described.  Most notably, there is a severe >90% stenosis at the right ICA origin which is similar to the prior CTA of 02/03/2022.  The vertebral arteries are patent in the neck.  Progressive severe stenosis of the left vertebral artery proximal V1 segment.  Aortic atherosclerosis and emphysema. CTA head: Acute occlusion of the proximal M2 left MCA vessel.  Background intracranial atherosclerotic disease with multifocal stenoses, most notably as follows.  Severe near occlusive stenosis of the intracranial left vertebral artery new from prior CTA 02/03/2022.  Severe stenosis of a right posterior cerebral artery branch at the P3/P4 junction, also new from the prior exam. CT perfusion: The perfusion solver identifies regions of hypoperfusion totaling 117 mL.  These foci are predominantly located within the left MCA vascular territory, although some foci are present within the right cerebral watershed territory.  The perfusion solver identifies no core infarct.  However, an acute infarct was demonstrated on the noncontrast head CT performed earlier today, as described.  The perfusion software reports a mismatch volume of 117 mL.  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 MRI brain: Acute infarct of the left basal ganglia and insula with punctate foci that extend to the subcortical white matter of the left frontal and parietal lobes.  No hemorrhage or mass effect.  Multiple old cerebellar infarcts and findings of chronic small vessel ischemia. 2D Echo there is a  large mobile clot in the LV that appears to be attached to the interventricular septum just under the mitral valve.  LVEF <20%.  The left ventricle has severely decreased function with global hypokinesis.  There is a small RV apical clot.  Right  ventricular systolic function is severely reduced.  There is a larger mobile clot in the LA.  Right and left atria are mildly dilated. LDL 71-on Crestor 5 mg HgbA1c 5.8 VTE prophylaxis-Eliquis No antithrombotic prior to admission, now on Eliquis for intracardiac thrombus.  Dysphagia: Finally passed swallow evaluation, started on dysphagia 1 diet for 03/06/2024.  Advanced to dysphagia 3 03/07/2024.  Respiratory insufficiency: Afternoon of 03/07/2024, patient was noted to have respiratory insufficiency and hypoxia requiring 4 L of oxygen.  Chest x-ray was repeated which actually showed improved opacity.  He received breathing treatment and felt better and eventually weaned to 3 L of oxygen.  He was still on 3 L today but I realized that half of the nasal prong was outside his nose so ideally he probably does not require that much of oxygen.  He is not complaining of shortness of breath today.  Lungs clear to auscultation like they were yesterday.  I did not see incentive spirometry at the bedside.  Discussed with patient's primary RN, advised her to wean down, provide and educate and encourage to use incentive spirometry every hour.   RV/LA/LV thrombus -high risk embolising, No family available to have risk-benefit discussion.  No evidence of PFO.  Hypercoagulability workup in process.  Patient on Lovenox, neurology recommended DOAC or warfarin, with patient being noncompliant, warfarin is likely not a good option, discussed with pharmacy, co-pay for Eliquis only f $4.  Transitioned to Eliquis.     Hypertension: Now on Coreg.  Blood pressure still elevated at times.   HFrEF -EF 20%.  On Coreg and losartan.  Not on any diuretics.  Cardiology on board and managing, unsure if there is any plans for intervention.   Schizoaffective: Continue Abilify.   COPD -triple nebs during hospitalization Albuterol for rescue   Chronic pain: Continue home dose of oxycodone.  Polysubstance abuse: UDS positive for  opiates, cocaine and benzos.  DVT prophylaxis: Eliquis   Code Status: Full Code  Family Communication:  None present at bedside.  Plan of care discussed with patient in length and he/she verbalized understanding and agreed with it.  Status is: Inpatient Remains inpatient appropriate because: Pending clearance from cardiology   Estimated body mass index is 25.62 kg/m as calculated from the following:   Height as of this encounter: 5\' 9"  (1.753 m).   Weight as of this encounter: 78.7 kg.    Nutritional Assessment: Body mass index is 25.62 kg/m.Marland Kitchen Seen by dietician.  I agree with the assessment and plan as outlined below: Nutrition Status:        . Skin Assessment: I have examined the patient's skin and I agree with the wound assessment as performed by the wound care RN as outlined below:    Consultants:  Cardiology and neurology  Procedures:  As above  Antimicrobials:  Anti-infectives (From admission, onward)    None         Subjective: Seen and examined.  No complaints today.  Objective: Vitals:   03/09/24 0334 03/09/24 0500 03/09/24 0743 03/09/24 0808  BP: (!) 153/107  (!) 157/116   Pulse: 74  72 77  Resp: 20  18 18   Temp: 97.8 F (36.6 C)  (!) 97.5 F (36.4 C)  TempSrc: Oral  Axillary   SpO2: 100%  100% 99%  Weight:  78.7 kg    Height:        Intake/Output Summary (Last 24 hours) at 03/09/2024 0920 Last data filed at 03/09/2024 0600 Gross per 24 hour  Intake 200 ml  Output 700 ml  Net -500 ml   Filed Weights   03/07/24 0500 03/08/24 0500 03/09/24 0500  Weight: 75 kg 79.9 kg 78.7 kg    Examination:  General exam: Appears calm and comfortable  Respiratory system: Clear to auscultation. Respiratory effort normal. Cardiovascular system: S1 & S2 heard, RRR. No JVD, murmurs, rubs, gallops or clicks. No pedal edema. Gastrointestinal system: Abdomen is nondistended, soft and nontender. No organomegaly or masses felt. Normal bowel sounds  heard. Central nervous system: Alert and appears to be oriented.  Expressive aphasia, right hemianopia, right facial droop right hemiplegia. Skin/extremities: No rashes, lesions or ulcers, bilateral BKA   Data Reviewed: I have personally reviewed following labs and imaging studies  CBC: Recent Labs  Lab 03/04/24 1436 03/05/24 0609 03/07/24 1009 03/08/24 0533 03/09/24 0550  WBC 6.6 6.4 5.4 5.6 5.5  NEUTROABS 4.2  --   --   --   --   HGB 14.3  15.0 13.9 13.9 12.9* 13.6  HCT 43.8  44.0 42.3 42.3 38.3* 40.7  MCV 93.8 93.4 93.2 92.7 93.8  PLT 210 229 199 187 189   Basic Metabolic Panel: Recent Labs  Lab 03/04/24 1436 03/04/24 1621 03/05/24 0609 03/07/24 1009  NA 138  141  --  142 140  K 3.7  3.6  --  3.7 4.0  CL 108  109  --  112* 109  CO2 19*  --  19* 22  GLUCOSE 136*  136*  --  74 100*  BUN 17  19  --  13 15  CREATININE 1.04  1.00  --  1.08 0.99  CALCIUM 7.9*  --  8.3* 8.6*  MG  --  1.3*  --   --    GFR: Estimated Creatinine Clearance: 84.3 mL/min (by C-G formula based on SCr of 0.99 mg/dL). Liver Function Tests: Recent Labs  Lab 03/04/24 1436  AST 59*  ALT 87*  ALKPHOS 67  BILITOT 2.4*  PROT 5.7*  ALBUMIN 2.9*   No results for input(s): "LIPASE", "AMYLASE" in the last 168 hours. No results for input(s): "AMMONIA" in the last 168 hours. Coagulation Profile: Recent Labs  Lab 03/04/24 1436  INR 1.5*   Cardiac Enzymes: No results for input(s): "CKTOTAL", "CKMB", "CKMBINDEX", "TROPONINI" in the last 168 hours. BNP (last 3 results) No results for input(s): "PROBNP" in the last 8760 hours. HbA1C: No results for input(s): "HGBA1C" in the last 72 hours. CBG: Recent Labs  Lab 03/08/24 1630 03/08/24 2147 03/08/24 2343 03/09/24 0336 03/09/24 0744  GLUCAP 113* 117* 146* 97 74   Lipid Profile: No results for input(s): "CHOL", "HDL", "LDLCALC", "TRIG", "CHOLHDL", "LDLDIRECT" in the last 72 hours.  Thyroid Function Tests: No results for input(s):  "TSH", "T4TOTAL", "FREET4", "T3FREE", "THYROIDAB" in the last 72 hours. Anemia Panel: No results for input(s): "VITAMINB12", "FOLATE", "FERRITIN", "TIBC", "IRON", "RETICCTPCT" in the last 72 hours. Sepsis Labs: No results for input(s): "PROCALCITON", "LATICACIDVEN" in the last 168 hours.  Recent Results (from the past 240 hours)  MRSA Next Gen by PCR, Nasal     Status: None   Collection Time: 03/04/24 11:10 PM   Specimen: Nasal Mucosa; Nasal Swab  Result Value Ref Range Status  MRSA by PCR Next Gen NOT DETECTED NOT DETECTED Final    Comment: (NOTE) The GeneXpert MRSA Assay (FDA approved for NASAL specimens only), is one component of a comprehensive MRSA colonization surveillance program. It is not intended to diagnose MRSA infection nor to guide or monitor treatment for MRSA infections. Test performance is not FDA approved in patients less than 20 years old. Performed at [facility], [address], [place], [name] [address]      Radiology Studies: VAS [place] LOWER EXTREMITY VENOUS (DVT) Result Date: 03/08/2024  Lower Venous DVT Study Patient Name:  [name]  Date of Exam:   03/08/2024 Medical Rec #: [id number]       Accession #:    [id number] Date of Birth: 11-Oct-1966       Patient Gender: M Patient Age:   30 years Exam Location:  [facility] Procedure:      VAS [place] LOWER EXTREMITY VENOUS (DVT) Referring Phys: RAKESH ALVA --------------------------------------------------------------------------------  Indications: Pain.  Limitations: Patient slightly agitate, could not position properly, nor lay still and bilateral amputations. Comparison Study: Previous study on 1.19.2025. Performing Technologist: Fernande Bras  Examination Guidelines: A complete evaluation includes B-mode imaging, spectral Doppler, color Doppler, and power Doppler as needed of all accessible portions of each vessel. Bilateral testing is considered an integral part of a complete examination. Limited  examinations for reoccurring indications may be performed as noted. The reflux portion of the exam is performed with the patient in reverse Trendelenburg.  +---------+---------------+---------+-----------+----------+--------------+ RIGHT    CompressibilityPhasicitySpontaneityPropertiesThrombus Aging +---------+---------------+---------+-----------+----------+--------------+ CFV      Full           Yes      Yes                                 +---------+---------------+---------+-----------+----------+--------------+ SFJ      Full           Yes      Yes                                 +---------+---------------+---------+-----------+----------+--------------+ FV Prox  Partial        Yes      Yes                                 +---------+---------------+---------+-----------+----------+--------------+ FV Mid   Partial        Yes      Yes                                 +---------+---------------+---------+-----------+----------+--------------+ FV DistalPartial        Yes      Yes                                 +---------+---------------+---------+-----------+----------+--------------+ PFV      Full                                                        +---------+---------------+---------+-----------+----------+--------------+ Right AKA.  +---------+---------------+---------+-----------+----------+--------------+  LEFT     CompressibilityPhasicitySpontaneityPropertiesThrombus Aging +---------+---------------+---------+-----------+----------+--------------+ CFV      Full           Yes      Yes                                 +---------+---------------+---------+-----------+----------+--------------+ SFJ      Full           Yes      Yes                                 +---------+---------------+---------+-----------+----------+--------------+ FV Prox  Full                                                         +---------+---------------+---------+-----------+----------+--------------+ FV Mid   Full                                                        +---------+---------------+---------+-----------+----------+--------------+ FV DistalFull                                                        +---------+---------------+---------+-----------+----------+--------------+ PFV      Full                                                        +---------+---------------+---------+-----------+----------+--------------+ POP      Full           No       No                                  +---------+---------------+---------+-----------+----------+--------------+ Left BKA.    Summary: RIGHT: - Findings consistent with age indeterminate deep vein thrombosis involving the right femoral vein.  - No cystic structure found in the popliteal fossa.  LEFT: - There is no evidence of deep vein thrombosis in the lower extremity.  - No cystic structure found in the popliteal fossa.  *See table(s) above for measurements and observations. Electronically signed by Lemar Livings MD on 03/08/2024 at 7:48:30 PM.    Final    DG CHEST PORT 1 VIEW Result Date: 03/07/2024 CLINICAL DATA:  Pneumonia. EXAM: PORTABLE CHEST 1 VIEW COMPARISON:  Radiograph and CT 01/13/2024 FINDINGS: Unchanged cardiomegaly. Stable mediastinal contours. Improved peripheral opacity in the right lung from prior. No new airspace disease. There is slight increase in peribronchial thickening. No significant effusion. No pneumothorax. IMPRESSION: 1. Improved peripheral opacity in the right lung from prior. No new airspace disease. 2. Slight increase in peribronchial thickening, may be bronchitis or reactive airways disease. 3. Unchanged cardiomegaly. Electronically Signed   By: Ivette Loyal.D.  On: 03/07/2024 21:34    Scheduled Meds:  apixaban  5 mg Oral BID   arformoterol  15 mcg Nebulization BID   ARIPiprazole  5 mg Oral Daily   budesonide  (PULMICORT) nebulizer solution  0.5 mg Nebulization BID   carvedilol  6.25 mg Oral BID WC   feeding supplement  237 mL Oral BID BM   losartan  25 mg Oral Daily   nicotine  14 mg Transdermal Daily   pantoprazole (PROTONIX) IV  40 mg Intravenous Q24H   revefenacin  175 mcg Nebulization Daily   rosuvastatin  5 mg Oral QHS   Continuous Infusions:     LOS: 5 days   Hughie Closs, MD [facility]  03/09/2024, 9:20 AM   *Please note that this is a verbal dictation therefore any spelling or grammatical errors are due to the "[employer]agon [employer] One" system interpretation.  Please page via Amion and do not message via secure chat for urgent patient care matters. Secure chat can be used for non urgent patient care matters.  How to contact the [facility] Attending or Consulting provider 7A - 7P or covering provider during after hours 7P -7A, for this patient?  Check the care team in [facility] and look for a) attending/consulting [facility] provider listed and b) the [facility] team listed. Page or secure chat 7A-7P. Log into [email or web address] and use [place]'s universal password to access. If you do not have the password, please contact the hospital operator. Locate the [facility] provider you are looking for under [facility] and page to a number that you can be directly reached. If you still have difficulty reaching the provider, please page the [facility] (Director on Call) for the Hospitalists listed on amion for assistance.

## 2024-03-09 NOTE — Progress Notes (Signed)
 Mg 1.7. Will supplement. F/u value ordered for AM.

## 2024-03-09 NOTE — Progress Notes (Signed)
 Patient refusing nebulizers this morning.

## 2024-03-09 NOTE — Progress Notes (Signed)
 STROKE TEAM PROGRESS NOTE   BRIEF HPI [name] is a 58 y.o. 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     SIGNIFICANT HOSPITAL EVENTS 4/4:  - Presentation for slurred speech ad right-sided weakness - CT head revealed an acute left MCA territory infarct affecting the insula, caudate nucleus, lentiform nucleus, and internal capsule within aspects of 6.  Also showed hyperdensity of the M1/proximal M2 left MCA.  - CTA showed acute occlusion of the proximal M2 left MCA, severe near occlusive stenosis of the left vertebral artery, severe stenosis of the right posterior cerebral artery branch at P3/P4 junction.  - Patient not a candidate for IR as his last known well was 26 hours PTA with a modified Rankin score of 4 and aspects of 6. - 2D Echo: Severe biventricular dysfunction with large mobile clots in the LA, RV, and LV.  LVEF <20%.  Mild dilation of left and right atria. Patient was started on a heparin gtt and admitted to the ICU   INTERIM HISTORY/SUBJECTIVE Patient is sitting up in bed.   He is restless.  Neurological exam is unchanged with expressive aphasia and slight left gaze preference and right hemiparesis.  OBJECTIVE CBC    Component Value Date/Time   WBC 5.5 03/09/2024 0550   RBC 4.34 03/09/2024 0550   HGB 13.6 03/09/2024 0550   HCT 40.7 03/09/2024 0550   PLT 189 03/09/2024 0550   MCV 93.8 03/09/2024 0550   MCH 31.3 03/09/2024 0550   MCHC 33.4 03/09/2024 0550   RDW 16.7 (H) 03/09/2024 0550   LYMPHSABS 1.6 03/04/2024 1436   MONOABS 0.7 03/04/2024 1436   EOSABS 0.0 03/04/2024 1436   BASOSABS 0.0  03/04/2024 1436   BMET    Component Value Date/Time   NA 140 03/07/2024 1009   K 4.0 03/07/2024 1009   CL 109 03/07/2024 1009   CO2 22 03/07/2024 1009   GLUCOSE 100 (H) 03/07/2024 1009   BUN 15 03/07/2024 1009   CREATININE 0.99 03/07/2024 1009   CALCIUM 8.6 (L) 03/07/2024 1009   GFRNONAA >60 03/07/2024 1009   Lab Results  Component Value Date   CHOL 110 03/05/2024   HDL 29 (L) 03/05/2024   LDLCALC 71 03/05/2024   TRIG 51 03/05/2024   CHOLHDL 3.8 03/05/2024   Lab Results  Component Value Date   HGBA1C 5.8 (H) 01/02/2024   Drugs of Abuse     Component Value Date/Time   LABOPIA POSITIVE (A) 03/05/2024 1121   COCAINSCRNUR POSITIVE (A) 03/05/2024 1121   COCAINSCRNUR POSITIVE (A) 12/25/2016 1533   LABBENZ POSITIVE (A) 03/05/2024 1121   AMPHETMU NONE DETECTED 03/05/2024 1121   THCU NONE DETECTED 03/05/2024 1121   LABBARB NONE DETECTED 03/05/2024 1121    IMAGING past 24 hours VAS [place] LOWER EXTREMITY VENOUS (DVT) Result Date: 03/08/2024  Lower Venous DVT Study Patient Name:  [name]  Date of Exam:   03/08/2024 Medical Rec #: [id number]       Accession #:    [id number] Date of Birth: [birth year]       Patient Gender: M Patient Age:   83 years Exam Location:  [facility] Procedure:      VAS  [place] LOWER EXTREMITY VENOUS (DVT) Referring Phys: RAKESH ALVA --------------------------------------------------------------------------------  Indications: Pain.  Limitations: Patient slightly agitate, could not position properly, nor lay still and bilateral amputations. Comparison Study: Previous study on 1.19.2025. Performing Technologist: Fernande Bras  Examination Guidelines: A complete evaluation includes B-mode imaging, spectral Doppler, color Doppler, and power Doppler as needed of all accessible portions of each vessel. Bilateral testing is considered an integral part of a complete examination. Limited examinations for reoccurring indications may be performed as noted. The reflux  portion of the exam is performed with the patient in reverse Trendelenburg.  +---------+---------------+---------+-----------+----------+--------------+ RIGHT    CompressibilityPhasicitySpontaneityPropertiesThrombus Aging +---------+---------------+---------+-----------+----------+--------------+ CFV      Full           Yes      Yes                                 +---------+---------------+---------+-----------+----------+--------------+ SFJ      Full           Yes      Yes                                 +---------+---------------+---------+-----------+----------+--------------+ FV Prox  Partial        Yes      Yes                                 +---------+---------------+---------+-----------+----------+--------------+ FV Mid   Partial        Yes      Yes                                 +---------+---------------+---------+-----------+----------+--------------+ FV DistalPartial        Yes      Yes                                 +---------+---------------+---------+-----------+----------+--------------+ PFV      Full                                                        +---------+---------------+---------+-----------+----------+--------------+ Right AKA.  +---------+---------------+---------+-----------+----------+--------------+ LEFT     CompressibilityPhasicitySpontaneityPropertiesThrombus Aging +---------+---------------+---------+-----------+----------+--------------+ CFV      Full           Yes      Yes                                 +---------+---------------+---------+-----------+----------+--------------+ SFJ      Full           Yes      Yes                                 +---------+---------------+---------+-----------+----------+--------------+ FV Prox  Full                                                        +---------+---------------+---------+-----------+----------+--------------+  FV Mid   Full                                                         +---------+---------------+---------+-----------+----------+--------------+ FV DistalFull                                                        +---------+---------------+---------+-----------+----------+--------------+ PFV      Full                                                        +---------+---------------+---------+-----------+----------+--------------+ POP      Full           No       No                                  +---------+---------------+---------+-----------+----------+--------------+ Left BKA.    Summary: RIGHT: - Findings consistent with age indeterminate deep vein thrombosis involving the right femoral vein.  - No cystic structure found in the popliteal fossa.  LEFT: - There is no evidence of deep vein thrombosis in the lower extremity.  - No cystic structure found in the popliteal fossa.  *See table(s) above for measurements and observations. Electronically signed by Lemar Livings MD on 03/08/2024 at 7:48:30 PM.    Final     Vitals:   03/09/24 0500 03/09/24 0743 03/09/24 0808 03/09/24 1142  BP:  (!) 157/116  (!) 155/116  Pulse:  72 77 66  Resp:  18 18 20   Temp:  (!) 97.5 F (36.4 C)    TempSrc:  Axillary    SpO2:  100% 99%   Weight: 78.7 kg     Height:       PHYSICAL EXAM General:  Acutely ill appearing African American male laying in ICU bed in no acute distress Psych:  Unable to assess, patient is drowsy in the bed with only brief arousal to repeat stimulation. CV: Regular rate and rhythm on monitor Respiratory:  Regular, unlabored respirations on room air GI: Abdomen soft and nontender  NEURO:  Mental Status: Awake alert mild expressive aphasia with nonfluent speech and can speak short sentences.  Marland Kitchen  Speech is severely dysarthric with very minimal verbal responses to examiner.  Does not participate in naming objects or repeating phrases.   Patient has a slight left gaze preference but will occasionally track  right of midline.  Right hemianopia. Right facial droop.  Good antigravity movements on the left.  Right hemiplegia is present with minimal movement without gravity on the right.  Gait deferred due to bilateral chronic BKA.  ASSESSMENT/PLAN Acute Ischemic Infarct: Acute infarct of the left basal ganglia and insula with punctate foci that extend to the subcortical white matter of the left frontal and parietal lobes. Etiology:  Cardioembolic with findings of large mobile clots in the LA, RV, and LV on 2d echo.  CT head: Left MCA territory infarct affecting the  insula, caudate nucleus, lentiform nucleus, and internal capsule.  Aspects is 6.  Asymmetric hyperdensity of the M1/proximal M2 left MCA suspicious for the presence of thrombus.  A CTA of the head/neck is recommended for further evaluation.  Background parenchymal atrophy, chronic small vessel ischemic disease and chronic infarcts, as described. CTA head: The common carotid and internal carotid arteries are patent within the neck.  Atherosclerotic plaque bilaterally, as described.  Most notably, there is a severe >90% stenosis at the right ICA origin which is similar to the prior CTA of 02/03/2022.  The vertebral arteries are patent in the neck.  Progressive severe stenosis of the left vertebral artery proximal V1 segment.  Aortic atherosclerosis and emphysema. CTA head: Acute occlusion of the proximal M2 left MCA vessel.  Background intracranial atherosclerotic disease with multifocal stenoses, most notably as follows.  Severe near occlusive stenosis of the intracranial left vertebral artery new from prior CTA 02/03/2022.  Severe stenosis of a right posterior cerebral artery branch at the P3/P4 junction, also new from the prior exam. CT perfusion: The perfusion solver identifies regions of hypoperfusion totaling 117 mL.  These foci are predominantly located within the left MCA vascular territory, although some foci are present within the right cerebral  watershed territory.  The perfusion solver identifies no core infarct.  However, an acute infarct was demonstrated on the noncontrast head CT performed earlier today, as described.  The perfusion software reports a mismatch volume of 117 mL.  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 MRI brain: Acute infarct of the left basal ganglia and insula with punctate foci that extend to the subcortical white matter of the left frontal and parietal lobes.  No hemorrhage or mass effect.  Multiple old cerebellar infarcts and findings of chronic small vessel ischemia. 2D Echo there is a large mobile clot in the LV that appears to be attached to the interventricular septum just under the mitral valve.  LVEF <20%.  The left ventricle has severely decreased function with global hypokinesis.  There is a small RV apical clot.  Right ventricular systolic function is severely reduced.  There is a larger mobile clot in the LA.  Right and left atria are mildly dilated. 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  Cardiomyopathy HFrEF Intracardiac thrombi  NSVT overnight  TTE EF < 20% Likely cardiac myopathy Large mobile clot in the LV Small RV apical clot, larger mobile clot in the LA On heparin drip Hypercoagulable work up ordered, pending  Prescribed home carvedilol, torsemide, Bidil per cardiology Concern for chronic medication non-adherence Cardiology on board  Hypertension Home meds: Carvedilol, torsemide, Bidil  Stable Blood pressure goal: gradual  normotension  Hyperlipidemia Home meds:  None LDL 71, goal < 70 Rosuvastatin 5 mg daily added by cardiology High intensity statin not indicated as patient is near goal without current medications Continue statin at discharge  Diabetes type II Controlled Hypoglycemia during admission Home meds:   HgbA1c 5.8, goal < 7.0 CBGs q4h Dextrose amp for hypoglycemia Will need tube feed initiation via NGT if fails swallow study for speech therapy Coretrack placement on Monday 03/07/24  as needed if patient does not pass swallow evaluation  Tobacco Abuse Remote tobacco use reported      Ready to quit? N/A  Substance Abuse Patient has a history of cocaine use UDS ordered Reports last cocaine use 2 days PTA      Ready to quit? N/A TOC consult for cessation as patient is able to participate  Dysphagia Patient has post-stroke dysphagia, SLP consulted    Diet   DIET DYS 3 Room service appropriate? Yes with Assist; Fluid consistency: Nectar Thick  Will need Coretrack placement Monday if he is unable to tolerate PO  Attempt NGT today if he does not pass swallow with ST reevaluation   Other Stroke Risk Factors Obesity, Body mass index is 25.62 kg/m., BMI >/= 30 associated with increased stroke risk, recommend weight loss, diet and exercise as appropriate  Questionable alcohol use, level <10 on arrival Coronary artery disease Congestive heart failure  Other Active Problems Schizoaffective disorder, Bipolar 1 disorder, history of SI and suicidal attempts, depression Continue home Abilify COPD Trelegy ellipta at home Home Pulmicort, albuterol Chronic pain On home gabapentin, oxycodone GERD On home protonix Bilateral BKA  Hospital day # 5 Patient  still has expressive aphasia with moderate dysarthria and right hemiparesis.  He has been switched to Eliquis for oral anticoagulation.  His compliance is likely going to be a problem regardless.   No family at the bedside.    Discussed with  Dr. Jacqulyn Bath .  Stroke team will sign off.  Kindly call for questions.  Follow-up with her outpatient stroke clinic in 2 months.        Delia Heady, MD Medical Director [name] Pager: [phone] 03/09/2024 12:55 PM  To contact Stroke Continuity provider, please refer to [email or web address]. After hours, contact General Neurology

## 2024-03-10 ENCOUNTER — Inpatient Hospital Stay (HOSPITAL_COMMUNITY): Payer: MEDICAID

## 2024-03-10 DIAGNOSIS — I639 Cerebral infarction, unspecified: Secondary | ICD-10-CM | POA: Diagnosis not present

## 2024-03-10 DIAGNOSIS — I240 Acute coronary thrombosis not resulting in myocardial infarction: Secondary | ICD-10-CM | POA: Diagnosis not present

## 2024-03-10 DIAGNOSIS — I5022 Chronic systolic (congestive) heart failure: Secondary | ICD-10-CM | POA: Diagnosis not present

## 2024-03-10 LAB — CBC
HCT: 45.1 % (ref 39.0–52.0)
Hemoglobin: 14.9 g/dL (ref 13.0–17.0)
MCH: 31.1 pg (ref 26.0–34.0)
MCHC: 33 g/dL (ref 30.0–36.0)
MCV: 94.2 fL (ref 80.0–100.0)
Platelets: 223 10*3/uL (ref 150–400)
RBC: 4.79 MIL/uL (ref 4.22–5.81)
RDW: 16.6 % — ABNORMAL HIGH (ref 11.5–15.5)
WBC: 6.4 10*3/uL (ref 4.0–10.5)
nRBC: 0 % (ref 0.0–0.2)

## 2024-03-10 LAB — TSH: TSH: 6.039 u[IU]/mL — ABNORMAL HIGH (ref 0.350–4.500)

## 2024-03-10 LAB — COMPREHENSIVE METABOLIC PANEL WITH GFR
ALT: 30 U/L (ref 0–44)
AST: 17 U/L (ref 15–41)
Albumin: 3.3 g/dL — ABNORMAL LOW (ref 3.5–5.0)
Alkaline Phosphatase: 63 U/L (ref 38–126)
Anion gap: 13 (ref 5–15)
BUN: 14 mg/dL (ref 6–20)
CO2: 23 mmol/L (ref 22–32)
Calcium: 9.1 mg/dL (ref 8.9–10.3)
Chloride: 102 mmol/L (ref 98–111)
Creatinine, Ser: 1.05 mg/dL (ref 0.61–1.24)
GFR, Estimated: >60 mL/min (ref >60)
Glucose, Bld: 108 mg/dL — ABNORMAL HIGH (ref 70–99)
Potassium: 4.6 mmol/L (ref 3.5–5.1)
Sodium: 138 mmol/L (ref 135–145)
Total Bilirubin: 1.6 mg/dL — ABNORMAL HIGH (ref 0.0–1.2)
Total Protein: 7 g/dL (ref 6.5–8.1)

## 2024-03-10 LAB — GLUCOSE, CAPILLARY
Glucose-Capillary: 105 mg/dL — ABNORMAL HIGH (ref 70–99)
Glucose-Capillary: 89 mg/dL (ref 70–99)
Glucose-Capillary: 48 mg/dL — ABNORMAL LOW (ref 70–99)
Glucose-Capillary: 57 mg/dL — ABNORMAL LOW (ref 70–99)
Glucose-Capillary: 106 mg/dL — ABNORMAL HIGH (ref 70–99)
Glucose-Capillary: 78 mg/dL (ref 70–99)
Glucose-Capillary: 136 mg/dL — ABNORMAL HIGH (ref 70–99)

## 2024-03-10 LAB — PROTHROMBIN GENE MUTATION

## 2024-03-10 LAB — MAGNESIUM: Magnesium: 2 mg/dL (ref 1.7–2.4)

## 2024-03-10 LAB — BRAIN NATRIURETIC PEPTIDE: B Natriuretic Peptide: 1475.2 pg/mL — ABNORMAL HIGH (ref 0.0–100.0)

## 2024-03-10 MED ORDER — FUROSEMIDE 10 MG/ML IJ SOLN
40.0000 mg | Freq: Once | INTRAMUSCULAR | Status: AC
  Administered 2024-03-10: 40 mg via INTRAVENOUS
  Filled 2024-03-10: qty 4

## 2024-03-10 MED ORDER — PANTOPRAZOLE SODIUM 40 MG PO TBEC
40.0000 mg | DELAYED_RELEASE_TABLET | Freq: Every day | ORAL | Status: DC
  Administered 2024-03-10 – 2024-03-31 (×10): 40 mg via ORAL
  Filled 2024-03-10 (×24): qty 1

## 2024-03-10 MED ORDER — DEXTROSE 50 % IV SOLN
25.0000 g | INTRAVENOUS | Status: AC
  Administered 2024-03-10: 25 g via INTRAVENOUS
  Filled 2024-03-10: qty 50

## 2024-03-10 MED ORDER — OXYCODONE HCL 5 MG PO TABS
2.5000 mg | ORAL_TABLET | Freq: Once | ORAL | Status: AC
  Administered 2024-03-10: 2.5 mg via ORAL
  Filled 2024-03-10: qty 1

## 2024-03-10 NOTE — Progress Notes (Signed)
 Instructed pt on how to use the incentive spirometer. Pt demonstrated skill back, and I observed him use it 10 times

## 2024-03-10 NOTE — Plan of Care (Signed)
  Problem: Education: Goal: Knowledge of disease or condition will improve Outcome: Not Progressing Goal: Knowledge of secondary prevention will improve (MUST DOCUMENT ALL) Outcome: Not Progressing Goal: Knowledge of patient specific risk factors will improve (DELETE if not current risk factor) Outcome: Not Progressing   Problem: Ischemic Stroke/TIA Tissue Perfusion: Goal: Complications of ischemic stroke/TIA will be minimized Outcome: Not Progressing   Problem: Coping: Goal: Will verbalize positive feelings about self Outcome: Not Progressing Goal: Will identify appropriate support needs Outcome: Not Progressing   Problem: Health Behavior/Discharge Planning: Goal: Ability to manage health-related needs will improve Outcome: Not Progressing Goal: Goals will be collaboratively established with patient/family Outcome: Not Progressing   Problem: Self-Care: Goal: Ability to participate in self-care as condition permits will improve Outcome: Not Progressing Goal: Verbalization of feelings and concerns over difficulty with self-care will improve Outcome: Not Progressing Goal: Ability to communicate needs accurately will improve Outcome: Not Progressing   Problem: Nutrition: Goal: Risk of aspiration will decrease Outcome: Not Progressing Goal: Dietary intake will improve Outcome: Not Progressing   Problem: Education: Goal: Knowledge of General Education information will improve Description: Including pain rating scale, medication(s)/side effects and non-pharmacologic comfort measures Outcome: Not Progressing   Problem: Health Behavior/Discharge Planning: Goal: Ability to manage health-related needs will improve Outcome: Not Progressing   Problem: Clinical Measurements: Goal: Ability to maintain clinical measurements within normal limits will improve Outcome: Not Progressing Goal: Will remain free from infection Outcome: Not Progressing Goal: Diagnostic test results will  improve Outcome: Not Progressing Goal: Respiratory complications will improve Outcome: Not Progressing Goal: Cardiovascular complication will be avoided Outcome: Not Progressing   Problem: Activity: Goal: Risk for activity intolerance will decrease Outcome: Not Progressing   Problem: Nutrition: Goal: Adequate nutrition will be maintained Outcome: Not Progressing   Problem: Coping: Goal: Level of anxiety will decrease Outcome: Not Progressing   Problem: Elimination: Goal: Will not experience complications related to bowel motility Outcome: Not Progressing Goal: Will not experience complications related to urinary retention Outcome: Not Progressing   Problem: Pain Managment: Goal: General experience of comfort will improve and/or be controlled Outcome: Not Progressing   Problem: Safety: Goal: Ability to remain free from injury will improve Outcome: Not Progressing   Problem: Skin Integrity: Goal: Risk for impaired skin integrity will decrease Outcome: Not Progressing

## 2024-03-10 NOTE — Progress Notes (Addendum)
 Patient still in pain. Informed Dr. Toniann Fail.

## 2024-03-10 NOTE — Progress Notes (Addendum)
 Patient is complaining of pain particularly on the left leg and back. Patient is below the knee amputated. Already gave him his oxycodone and still complaining of pain. Breathing is labored. The patient is on oxygen at 2lpm per nasal canula and oxygen saturation of 100%. Informed Dr. Toniann Fail.

## 2024-03-10 NOTE — Progress Notes (Signed)
 Patient shouting help from the room. Patient stated he is in pain. Pain medicine available is due at 5am. Repositioned patient. Maintained oxygen per nasal canula. Informed Dr. Toniann Fail.

## 2024-03-10 NOTE — Plan of Care (Signed)
  Problem: Education: Goal: Knowledge of disease or condition will improve Outcome: Progressing   Problem: Ischemic Stroke/TIA Tissue Perfusion: Goal: Complications of ischemic stroke/TIA will be minimized Outcome: Progressing   Problem: Coping: Goal: Will verbalize positive feelings about self Outcome: Progressing   Problem: Self-Care: Goal: Ability to communicate needs accurately will improve Outcome: Progressing   Problem: Clinical Measurements: Goal: Respiratory complications will improve Outcome: Progressing

## 2024-03-10 NOTE — Progress Notes (Signed)
 [name] with increased work of breathing, complaints of shortness of breath despite oxygen.  Cardiology PA rounding at this time and notified of concerns.   Orders received for chest X-ray, Lasix IV, and STAT EKG.  EKG tech present to complete EKG.    03/10/24 1010  Provider Notification  Provider Name/Title H. Lisabeth Devoid PA/ PA Cardiology  Date Provider Notified 03/10/24  Time Provider Notified 1010  Method of Notification Rounds  Notification Reason Change in status (SOB, Dyspnea)  Provider response At bedside;See new orders  Date of Provider Response 03/10/24  Time of Provider Response 1011

## 2024-03-10 NOTE — Plan of Care (Signed)
 Problem: Education: Goal: Knowledge of disease or condition will improve 03/10/2024 0351 by Avie Arenas, RN Outcome: Not Progressing 03/10/2024 0350 by Avie Arenas, RN Outcome: Not Progressing Goal: Knowledge of secondary prevention will improve (MUST DOCUMENT ALL) 03/10/2024 0351 by Avie Arenas, RN Outcome: Not Progressing 03/10/2024 0350 by Avie Arenas, RN Outcome: Not Progressing Goal: Knowledge of patient specific risk factors will improve (DELETE if not current risk factor) 03/10/2024 0351 by Avie Arenas, RN Outcome: Not Progressing 03/10/2024 0350 by Avie Arenas, RN Outcome: Not Progressing   Problem: Ischemic Stroke/TIA Tissue Perfusion: Goal: Complications of ischemic stroke/TIA will be minimized 03/10/2024 0351 by Avie Arenas, RN Outcome: Not Progressing 03/10/2024 0350 by Avie Arenas, RN Outcome: Not Progressing   Problem: Coping: Goal: Will verbalize positive feelings about self 03/10/2024 0351 by Avie Arenas, RN Outcome: Not Progressing 03/10/2024 0350 by Avie Arenas, RN Outcome: Not Progressing Goal: Will identify appropriate support needs 03/10/2024 0351 by Avie Arenas, RN Outcome: Not Progressing 03/10/2024 0350 by Avie Arenas, RN Outcome: Not Progressing   Problem: Health Behavior/Discharge Planning: Goal: Ability to manage health-related needs will improve 03/10/2024 0351 by Avie Arenas, RN Outcome: Not Progressing 03/10/2024 0350 by Avie Arenas, RN Outcome: Not Progressing Goal: Goals will be collaboratively established with patient/family 03/10/2024 0351 by Avie Arenas, RN Outcome: Not Progressing 03/10/2024 0350 by Avie Arenas, RN Outcome: Not Progressing   Problem: Self-Care: Goal: Ability to participate in self-care as condition permits will improve 03/10/2024 0351 by Avie Arenas, RN Outcome: Not Progressing 03/10/2024 0350 by Avie Arenas, RN Outcome:  Not Progressing Goal: Verbalization of feelings and concerns over difficulty with self-care will improve 03/10/2024 0351 by Avie Arenas, RN Outcome: Not Progressing 03/10/2024 0350 by Avie Arenas, RN Outcome: Not Progressing Goal: Ability to communicate needs accurately will improve 03/10/2024 0351 by Avie Arenas, RN Outcome: Not Progressing 03/10/2024 0350 by Avie Arenas, RN Outcome: Not Progressing   Problem: Nutrition: Goal: Risk of aspiration will decrease 03/10/2024 0351 by Avie Arenas, RN Outcome: Not Progressing 03/10/2024 0350 by Avie Arenas, RN Outcome: Not Progressing Goal: Dietary intake will improve 03/10/2024 0351 by Avie Arenas, RN Outcome: Not Progressing 03/10/2024 0350 by Avie Arenas, RN Outcome: Not Progressing   Problem: Education: Goal: Knowledge of General Education information will improve Description: Including pain rating scale, medication(s)/side effects and non-pharmacologic comfort measures 03/10/2024 0351 by Avie Arenas, RN Outcome: Not Progressing 03/10/2024 0350 by Avie Arenas, RN Outcome: Not Progressing   Problem: Health Behavior/Discharge Planning: Goal: Ability to manage health-related needs will improve 03/10/2024 0351 by Avie Arenas, RN Outcome: Not Progressing 03/10/2024 0350 by Avie Arenas, RN Outcome: Not Progressing   Problem: Clinical Measurements: Goal: Ability to maintain clinical measurements within normal limits will improve 03/10/2024 0351 by Avie Arenas, RN Outcome: Not Progressing 03/10/2024 0350 by Avie Arenas, RN Outcome: Not Progressing Goal: Will remain free from infection 03/10/2024 0351 by Avie Arenas, RN Outcome: Not Progressing 03/10/2024 0350 by Avie Arenas, RN Outcome: Not Progressing Goal: Diagnostic test results will improve 03/10/2024 0351 by Avie Arenas, RN Outcome: Not Progressing 03/10/2024 0350 by Avie Arenas,  RN Outcome: Not Progressing Goal: Respiratory complications will improve 03/10/2024 0351 by Avie Arenas, RN Outcome: Not Progressing 03/10/2024 0350 by Avie Arenas, RN Outcome: Not Progressing Goal: Cardiovascular complication will be avoided 03/10/2024 0351 by  Avie Arenas, RN Outcome: Not Progressing 03/10/2024 0350 by Avie Arenas, RN Outcome: Not Progressing   Problem: Activity: Goal: Risk for activity intolerance will decrease 03/10/2024 0351 by Avie Arenas, RN Outcome: Not Progressing 03/10/2024 0350 by Avie Arenas, RN Outcome: Not Progressing   Problem: Nutrition: Goal: Adequate nutrition will be maintained 03/10/2024 0351 by Avie Arenas, RN Outcome: Not Progressing 03/10/2024 0350 by Avie Arenas, RN Outcome: Not Progressing   Problem: Coping: Goal: Level of anxiety will decrease 03/10/2024 0351 by Avie Arenas, RN Outcome: Not Progressing 03/10/2024 0350 by Avie Arenas, RN Outcome: Not Progressing   Problem: Elimination: Goal: Will not experience complications related to bowel motility 03/10/2024 0351 by Avie Arenas, RN Outcome: Not Progressing 03/10/2024 0350 by Avie Arenas, RN Outcome: Not Progressing Goal: Will not experience complications related to urinary retention 03/10/2024 0351 by Avie Arenas, RN Outcome: Not Progressing 03/10/2024 0350 by Avie Arenas, RN Outcome: Not Progressing   Problem: Pain Managment: Goal: General experience of comfort will improve and/or be controlled 03/10/2024 0351 by Avie Arenas, RN Outcome: Not Progressing 03/10/2024 0350 by Avie Arenas, RN Outcome: Not Progressing   Problem: Safety: Goal: Ability to remain free from injury will improve 03/10/2024 0351 by Avie Arenas, RN Outcome: Not Progressing 03/10/2024 0350 by Avie Arenas, RN Outcome: Not Progressing   Problem: Skin Integrity: Goal: Risk for impaired skin integrity will  decrease 03/10/2024 0351 by Avie Arenas, RN Outcome: Not Progressing 03/10/2024 0350 by Avie Arenas, RN Outcome: Not Progressing

## 2024-03-10 NOTE — Progress Notes (Signed)
 PROGRESS NOTE    [name]  ZOX:[id number] DOB: 20-Feb-1966 DOA: 03/04/2024 PCP: Georganna Skeans, MD   Brief Narrative:  58 year old man with cardiomyopathy and active cocaine use brought in as a code stroke by EMS.  His caregiver Inetta Fermo noted slurred speech and confusion.  Right arm and right leg weakness was noted by EMS neurology evaluation showed global aphasia and dysarthria with right facial droop, NIHSS was 20 Head CT showed acute left MCA infarct CTA head and neck showed left M2 occlusion Echo showed large mobile LV thrombus, LA thrombus and RV apical thrombus, EF was less than 20%.  Neurology and cardiology consulting and due to high risk of further emboli, decision made to anticoagulate and admit to ICU.  LV thrombus is more than 3 cm in size.  He was initially admitted under hospital service, transferred under ICU same day on 03/04/2024.  transferred under [facility] care again on 03/07/2024.  Further details below.   Assessment & Plan:   Principal Problem:   Acute CVA (cerebrovascular accident) (HCC) Active Problems:   Acute thrombus of left ventricle (HCC)   Chronic systolic CHF (congestive heart failure) (HCC)   Prolonged QT interval   Elevated liver enzymes   COPD (chronic obstructive pulmonary disease) (HCC)   Cocaine abuse (HCC)   Schizoaffective disorder, bipolar type (HCC)   Benign essential HTN   Acid reflux   History of DVT (deep vein thrombosis)   Type 2 diabetes mellitus (HCC)   Status post bilateral BKA.   Dilated cardiomyopathy (HCC)  Acute left MCA CVA/hyperlipidemia:  CT head: Left MCA territory infarct affecting the insula, caudate nucleus, lentiform nucleus, and internal capsule.  Aspects is 6.  Asymmetric hyperdensity of the M1/proximal M2 left MCA suspicious for the presence of thrombus.  A CTA of the head/neck is recommended for further evaluation.  Background parenchymal atrophy, chronic small vessel ischemic disease and chronic infarcts, as described. CTA head:  The common carotid and internal carotid arteries are patent within the neck.  Atherosclerotic plaque bilaterally, as described.  Most notably, there is a severe >90% stenosis at the right ICA origin which is similar to the prior CTA of 02/03/2022.  The vertebral arteries are patent in the neck.  Progressive severe stenosis of the left vertebral artery proximal V1 segment.  Aortic atherosclerosis and emphysema. CTA head: Acute occlusion of the proximal M2 left MCA vessel.  Background intracranial atherosclerotic disease with multifocal stenoses, most notably as follows.  Severe near occlusive stenosis of the intracranial left vertebral artery new from prior CTA 02/03/2022.  Severe stenosis of a right posterior cerebral artery branch at the P3/P4 junction, also new from the prior exam. CT perfusion: The perfusion solver identifies regions of hypoperfusion totaling 117 mL.  These foci are predominantly located within the left MCA vascular territory, although some foci are present within the right cerebral watershed territory.  The perfusion solver identifies no core infarct.  However, an acute infarct was demonstrated on the noncontrast head CT performed earlier today, as described.  The perfusion software reports a mismatch volume of 117 mL.  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 MRI brain: Acute infarct of the left basal ganglia and insula with punctate foci that extend to the subcortical white matter of the left frontal and parietal lobes.  No hemorrhage or mass effect.  Multiple old cerebellar infarcts and findings of chronic small vessel ischemia. 2D  Echo there is a large mobile clot in the LV that appears to be attached to the interventricular septum just under the mitral valve.  LVEF <20%.  The left ventricle has severely decreased function with global hypokinesis.  There is a small RV  apical clot.  Right ventricular systolic function is severely reduced.  There is a larger mobile clot in the LA.  Right and left atria are mildly dilated. LDL 71-on Crestor 5 mg HgbA1c 5.8 VTE prophylaxis-Eliquis No antithrombotic prior to admission, now on Eliquis for intracardiac thrombus. Neurology signed off  Dysphagia: Finally passed swallow evaluation, started on dysphagia 1 diet for 03/06/2024.  Advanced to dysphagia 3 03/07/2024.  Respiratory insufficiency: Afternoon of 03/07/2024, patient was noted to have respiratory insufficiency and hypoxia requiring 4 L of oxygen.  Chest x-ray was repeated which actually showed improved opacity.  He received breathing treatment and felt better and eventually weaned to 3 L of oxygen.  I believed his hypoxia was mainly because of atelectasis, incentive spirometry ordered 2 days ago but patient never received it.  Talk to the nurse again yesterday, finally he received it, he is currently on 2 L but saturating 100%, nurse had been advised to wean oxygen as able to.  However repeat chest x-ray was done by cardiology 03/09/2024 which shows vascular congestion.  Will defer to cardiology for any diuretics.  Encouraged incentive spirometry to be used every hour.   Hypoglycemia: Patient was hypoglycemic with blood sugar of 48 followed by 57.  This is due to poor p.o. intake.  Not interested in eating.  Treated with dextrose.  Monitor closely.  RV/LA/LV thrombus -high risk embolising, No family available to have risk-benefit discussion.  No evidence of PFO.  Hypercoagulability workup in process.  Patient on Lovenox, neurology recommended DOAC or warfarin, with patient being noncompliant, warfarin is likely not a good option, discussed with pharmacy, co-pay for Eliquis only f $4.  Transitioned to Eliquis.     Hypertension: Now on Coreg and losartan.  Blood pressure still elevated at times.   HFrEF -EF 20%.  On Aldactone, Coreg and losartan.  Not on any diuretics.   Cardiology on board and managing, unsure if there is any plans for intervention.   Schizoaffective: Continue Abilify.   COPD -triple nebs during hospitalization Albuterol for rescue   Chronic pain: Continue home dose of oxycodone.  Polysubstance abuse: UDS positive for opiates, cocaine and benzos.  GOC: Patient appears to be tired of being in the hospital.  Has poor short and long-term prognosis.  Consulted palliative care.  DVT prophylaxis: Eliquis   Code Status: Full Code  Family Communication:  None present at bedside.   Status is: Inpatient Remains inpatient appropriate because: Hypoxic   Estimated body mass index is 25.59 kg/m as calculated from the following:   Height as of this encounter: 5\' 9"  (1.753 m).   Weight as of this encounter: 78.6 kg.    Nutritional Assessment: Body mass index is 25.59 kg/m.Marland Kitchen Seen by dietician.  I agree with the assessment and plan as outlined below: Nutrition Status:        . Skin Assessment: I have examined the patient's skin and I agree with the wound assessment as performed by the wound care RN as outlined below:    Consultants:  Cardiology and neurology  Procedures:  As above  Antimicrobials:  Anti-infectives (From admission, onward)    None         Subjective: Seen and examined.  Breakfast was sitting  next to him but he was not interested in eating.  He had no complaints.  Denied any shortness of breath when I asked that question.  Objective: Vitals:   03/09/24 2340 03/10/24 0431 03/10/24 0440 03/10/24 0746  BP: (!) 155/111 (!) 155/110  (!) 146/95  Pulse: 74 78  72  Resp: 18 18  16   Temp: 97.8 F (36.6 C) 99.1 F (37.3 C)  98.6 F (37 C)  TempSrc:  Oral    SpO2: 100% 100%  100%  Weight:   78.6 kg   Height:        Intake/Output Summary (Last 24 hours) at 03/10/2024 0756 Last data filed at 03/09/2024 1832 Gross per 24 hour  Intake 240 ml  Output 700 ml  Net -460 ml   Filed Weights   03/08/24 0500  03/09/24 0500 03/10/24 0440  Weight: 79.9 kg 78.7 kg 78.6 kg    Examination:  General exam: Appears calm and comfortable  Respiratory system: Clear to auscultation. Respiratory effort normal. Cardiovascular system: S1 & S2 heard, RRR. No JVD, murmurs, rubs, gallops or clicks. No pedal edema. Gastrointestinal system: Abdomen is nondistended, soft and nontender. No organomegaly or masses felt. Normal bowel sounds heard. Central nervous system: Alert and oriented.  Expressive aphasia with right hemianopia and right facial droop and right hemiaplasia. Extremities: Bilateral BKA  Data Reviewed: I have personally reviewed following labs and imaging studies  CBC: Recent Labs  Lab 03/04/24 1436 03/05/24 0609 03/07/24 1009 03/08/24 0533 03/09/24 0550 03/10/24 0601  WBC 6.6 6.4 5.4 5.6 5.5 6.4  NEUTROABS 4.2  --   --   --   --   --   HGB 14.3  15.0 13.9 13.9 12.9* 13.6 14.9  HCT 43.8  44.0 42.3 42.3 38.3* 40.7 45.1  MCV 93.8 93.4 93.2 92.7 93.8 94.2  PLT 210 229 199 187 189 223   Basic Metabolic Panel: Recent Labs  Lab 03/04/24 1436 03/04/24 1621 03/05/24 0609 03/07/24 1009 03/09/24 1045 03/10/24 0601  NA 138  141  --  142 140  --  138  K 3.7  3.6  --  3.7 4.0  --  4.6  CL 108  109  --  112* 109  --  102  CO2 19*  --  19* 22  --  23  GLUCOSE 136*  136*  --  74 100*  --  108*  BUN 17  19  --  13 15  --  14  CREATININE 1.04  1.00  --  1.08 0.99  --  1.05  CALCIUM 7.9*  --  8.3* 8.6*  --  9.1  MG  --  1.3*  --   --  1.7 2.0   GFR: Estimated Creatinine Clearance: 79.5 mL/min (by C-G formula based on SCr of 1.05 mg/dL). Liver Function Tests: Recent Labs  Lab 03/04/24 1436 03/10/24 0601  AST 59* 17  ALT 87* 30  ALKPHOS 67 63  BILITOT 2.4* 1.6*  PROT 5.7* 7.0  ALBUMIN 2.9* 3.3*   No results for input(s): "LIPASE", "AMYLASE" in the last 168 hours. No results for input(s): "AMMONIA" in the last 168 hours. Coagulation Profile: Recent Labs  Lab 03/04/24 1436   INR 1.5*   Cardiac Enzymes: No results for input(s): "CKTOTAL", "CKMB", "CKMBINDEX", "TROPONINI" in the last 168 hours. BNP (last 3 results) No results for input(s): "PROBNP" in the last 8760 hours. HbA1C: No results for input(s): "HGBA1C" in the last 72 hours. CBG: Recent Labs  Lab 03/09/24  1709 03/09/24 1958 03/09/24 2339 03/10/24 0618 03/10/24 0753  GLUCAP 79 175* 109* 105* 89   Lipid Profile: No results for input(s): "CHOL", "HDL", "LDLCALC", "TRIG", "CHOLHDL", "LDLDIRECT" in the last 72 hours.  Thyroid Function Tests: Recent Labs    03/10/24 0601  TSH 6.039*   Anemia Panel: No results for input(s): "VITAMINB12", "FOLATE", "FERRITIN", "TIBC", "IRON", "RETICCTPCT" in the last 72 hours. Sepsis Labs: No results for input(s): "PROCALCITON", "LATICACIDVEN" in the last 168 hours.  Recent Results (from the past 240 hours)  MRSA Next Gen by PCR, Nasal     Status: None   Collection Time: 03/04/24 11:10 PM   Specimen: Nasal Mucosa; Nasal Swab  Result Value Ref Range Status   MRSA by PCR Next Gen NOT DETECTED NOT DETECTED Final    Comment: (NOTE) The GeneXpert MRSA Assay (FDA approved for NASAL specimens only), is one component of a comprehensive MRSA colonization surveillance program. It is not intended to diagnose MRSA infection nor to guide or monitor treatment for MRSA infections. Test performance is not FDA approved in patients less than 74 years old. Performed at [facility], [address], [place], [name] [address]      Radiology Studies: DG Chest Port 1 View Result Date: 03/10/2024 CLINICAL DATA:  Shortness of breath EXAM: PORTABLE CHEST 1 VIEW COMPARISON:  03/07/2024 FINDINGS: Cardiomegaly with vascular congestion. No acute airspace disease, pleural effusion or pneumothorax. Aortic atherosclerosis IMPRESSION: Cardiomegaly with vascular congestion. Electronically Signed   By: Jasmine Pang M.D.   On: 03/10/2024 01:47   VAS [place] LOWER EXTREMITY VENOUS  (DVT) Result Date: 03/08/2024  Lower Venous DVT Study Patient Name:  [name]  Date of Exam:   03/08/2024 Medical Rec #: [id number]       Accession #:    [id number] Date of Birth: 07-20-1966       Patient Gender: M Patient Age:   54 years Exam Location:  [facility] Procedure:      VAS [place] LOWER EXTREMITY VENOUS (DVT) Referring Phys: RAKESH ALVA --------------------------------------------------------------------------------  Indications: Pain.  Limitations: Patient slightly agitate, could not position properly, nor lay still and bilateral amputations. Comparison Study: Previous study on 1.19.2025. Performing Technologist: Fernande Bras  Examination Guidelines: A complete evaluation includes B-mode imaging, spectral Doppler, color Doppler, and power Doppler as needed of all accessible portions of each vessel. Bilateral testing is considered an integral part of a complete examination. Limited examinations for reoccurring indications may be performed as noted. The reflux portion of the exam is performed with the patient in reverse Trendelenburg.  +---------+---------------+---------+-----------+----------+--------------+ RIGHT    CompressibilityPhasicitySpontaneityPropertiesThrombus Aging +---------+---------------+---------+-----------+----------+--------------+ CFV      Full           Yes      Yes                                 +---------+---------------+---------+-----------+----------+--------------+ SFJ      Full           Yes      Yes                                 +---------+---------------+---------+-----------+----------+--------------+ FV Prox  Partial        Yes      Yes                                 +---------+---------------+---------+-----------+----------+--------------+  FV Mid   Partial        Yes      Yes                                 +---------+---------------+---------+-----------+----------+--------------+ FV DistalPartial        Yes       Yes                                 +---------+---------------+---------+-----------+----------+--------------+ PFV      Full                                                        +---------+---------------+---------+-----------+----------+--------------+ Right AKA.  +---------+---------------+---------+-----------+----------+--------------+ LEFT     CompressibilityPhasicitySpontaneityPropertiesThrombus Aging +---------+---------------+---------+-----------+----------+--------------+ CFV      Full           Yes      Yes                                 +---------+---------------+---------+-----------+----------+--------------+ SFJ      Full           Yes      Yes                                 +---------+---------------+---------+-----------+----------+--------------+ FV Prox  Full                                                        +---------+---------------+---------+-----------+----------+--------------+ FV Mid   Full                                                        +---------+---------------+---------+-----------+----------+--------------+ FV DistalFull                                                        +---------+---------------+---------+-----------+----------+--------------+ PFV      Full                                                        +---------+---------------+---------+-----------+----------+--------------+ POP      Full           No       No                                  +---------+---------------+---------+-----------+----------+--------------+ Left BKA.    Summary: RIGHT: -  Findings consistent with age indeterminate deep vein thrombosis involving the right femoral vein.  - No cystic structure found in the popliteal fossa.  LEFT: - There is no evidence of deep vein thrombosis in the lower extremity.  - No cystic structure found in the popliteal fossa.  *See table(s) above for measurements and observations.  Electronically signed by Lemar Livings MD on 03/08/2024 at 7:48:30 PM.    Final     Scheduled Meds:  apixaban  5 mg Oral BID   arformoterol  15 mcg Nebulization BID   ARIPiprazole  5 mg Oral Daily   budesonide (PULMICORT) nebulizer solution  0.5 mg Nebulization BID   carvedilol  6.25 mg Oral BID WC   feeding supplement  237 mL Oral BID BM   losartan  50 mg Oral Daily   nicotine  14 mg Transdermal Daily   pantoprazole (PROTONIX) IV  40 mg Intravenous Q24H   revefenacin  175 mcg Nebulization Daily   rosuvastatin  5 mg Oral QHS   spironolactone  12.5 mg Oral Daily   Continuous Infusions:     LOS: 6 days   Hughie Closs, MD [facility]  03/10/2024, 7:56 AM   *Please note that this is a verbal dictation therefore any spelling or grammatical errors are due to the "[employer]agon [employer] One" system interpretation.  Please page via Amion and do not message via secure chat for urgent patient care matters. Secure chat can be used for non urgent patient care matters.  How to contact the [facility] Attending or Consulting provider 7A - 7P or covering provider during after hours 7P -7A, for this patient?  Check the care team in [facility] and look for a) attending/consulting [facility] provider listed and b) the [facility] team listed. Page or secure chat 7A-7P. Log into [email or web address] and use [facility] universal password to access. If you do not have the password, please contact the hospital operator. Locate the [facility] provider you are looking for under [facility] and page to a number that you can be directly reached. If you still have difficulty reaching the provider, please page the [facility] (Director on Call) for the Hospitalists listed on amion for assistance.

## 2024-03-10 NOTE — Progress Notes (Signed)
 Dr. Jacqulyn Bath made aware of hypoglycemic event.  Notified that [name] is refusing oral intake, refusing meals at this time and refusing to drink despite educating on hypoglycemia of 57.     03/10/24 1250  Provider Notification  Provider Name/Title Dr. Jacqulyn Bath Attending  Date Provider Notified 03/10/24  Time Provider Notified 1250  Method of Notification  (Secure Chat)  Notification Reason Change in status (Hypoglycemic event)  Provider response Evaluate remotely;No new orders  Date of Provider Response 03/10/24  Time of Provider Response 1255

## 2024-03-10 NOTE — Progress Notes (Signed)
 Hypoglycemic Event  CBG: 57  Treatment: D50 25 mL (12.5 gm) --given at 1221 via IV    Symptoms: None  Follow-up CBG: Time: 1250 CBG Result:106  Possible Reasons for Event: Inadequate meal intake  Comments/MD notified: Dr. Jacqulyn Bath made aware of hypoglycemic event.

## 2024-03-10 NOTE — Progress Notes (Signed)
 patient refused to have their vitals and blood sugar taken.

## 2024-03-10 NOTE — Progress Notes (Addendum)
 Rounding Note    Patient Name: [name] Date of Encounter: 03/10/2024  [facility] Cardiologist: Rollene Rotunda, MD   Subjective   Did not communicate, appears to be breathing heavily. Per nurse, patient has been having some increase dyspnea overnight and keep hitting his chest. Patient himself is unable to express any SOB or CP due to stroke.   Inpatient Medications    Scheduled Meds:  apixaban  5 mg Oral BID   arformoterol  15 mcg Nebulization BID   ARIPiprazole  5 mg Oral Daily   budesonide (PULMICORT) nebulizer solution  0.5 mg Nebulization BID   carvedilol  6.25 mg Oral BID WC   feeding supplement  237 mL Oral BID BM   losartan  50 mg Oral Daily   nicotine  14 mg Transdermal Daily   pantoprazole  40 mg Oral Daily   revefenacin  175 mcg Nebulization Daily   rosuvastatin  5 mg Oral QHS   spironolactone  12.5 mg Oral Daily   Continuous Infusions:  PRN Meds: albuterol, oxyCODONE   Vital Signs    Vitals:   03/10/24 0748 03/10/24 0758 03/10/24 0759 03/10/24 0800  BP: (!) 146/95     Pulse: 72 75    Resp:  20    Temp:      TempSrc:      SpO2:  98% 98% 98%  Weight:      Height:        Intake/Output Summary (Last 24 hours) at 03/10/2024 0949 Last data filed at 03/09/2024 1832 Gross per 24 hour  Intake 240 ml  Output 700 ml  Net -460 ml      03/10/2024    4:40 AM 03/09/2024    5:00 AM 03/08/2024    5:00 AM  Last 3 Weights  Weight (lbs) 173 lb 4.5 oz 173 lb 8 oz 176 lb 2.4 oz  Weight (kg) 78.6 kg 78.7 kg 79.9 kg      Telemetry    Currently off of tele, previously was in NSR and occasional PVCs - Personally Reviewed  ECG    NSR with TWI in the anterior leads - Personally Reviewed  Physical Exam   GEN: No acute distress.   Neck: No JVD Cardiac: RRR, no murmurs, rubs, or gallops.  Respiratory: bilateral rhonchi. GI: Soft, nontender, non-distended  MS: No edema; No deformity. Neuro:  Nonfocal  Psych: Normal affect   Labs    High  Sensitivity Troponin:  No results for input(s): "TROPONINIHS" in the last 720 hours.   Chemistry Recent Labs  Lab 03/04/24 1436 03/04/24 1621 03/05/24 0609 03/07/24 1009 03/09/24 1045 03/10/24 0601  NA 138  141  --  142 140  --  138  K 3.7  3.6  --  3.7 4.0  --  4.6  CL 108  109  --  112* 109  --  102  CO2 19*  --  19* 22  --  23  GLUCOSE 136*  136*  --  74 100*  --  108*  BUN 17  19  --  13 15  --  14  CREATININE 1.04  1.00  --  1.08 0.99  --  1.05  CALCIUM 7.9*  --  8.3* 8.6*  --  9.1  MG  --  1.3*  --   --  1.7 2.0  PROT 5.7*  --   --   --   --  7.0  ALBUMIN 2.9*  --   --   --   --  3.3*  AST 59*  --   --   --   --  17  ALT 87*  --   --   --   --  30  ALKPHOS 67  --   --   --   --  63  BILITOT 2.4*  --   --   --   --  1.6*  GFRNONAA >60  --  >60 >60  --  >60  ANIONGAP 11  --  11 9  --  13    Lipids  Recent Labs  Lab 03/05/24 0609  CHOL 110  TRIG 51  HDL 29*  LDLCALC 71  CHOLHDL 3.8    Hematology Recent Labs  Lab 03/08/24 0533 03/09/24 0550 03/10/24 0601  WBC 5.6 5.5 6.4  RBC 4.13* 4.34 4.79  HGB 12.9* 13.6 14.9  HCT 38.3* 40.7 45.1  MCV 92.7 93.8 94.2  MCH 31.2 31.3 31.1  MCHC 33.7 33.4 33.0  RDW 16.6* 16.7* 16.6*  PLT 187 189 223   Thyroid  Recent Labs  Lab 03/10/24 0601  TSH 6.039*    BNPNo results for input(s): "BNP", "PROBNP" in the last 168 hours.  DDimer No results for input(s): "DDIMER" in the last 168 hours.   Radiology    DG Chest Port 1 View Result Date: 03/10/2024 CLINICAL DATA:  Shortness of breath EXAM: PORTABLE CHEST 1 VIEW COMPARISON:  03/07/2024 FINDINGS: Cardiomegaly with vascular congestion. No acute airspace disease, pleural effusion or pneumothorax. Aortic atherosclerosis IMPRESSION: Cardiomegaly with vascular congestion. Electronically Signed   By: Jasmine Pang M.D.   On: 03/10/2024 01:47   VAS [place] LOWER EXTREMITY VENOUS (DVT) Result Date: 03/08/2024  Lower Venous DVT Study Patient Name:  [name]  Date of Exam:    03/08/2024 Medical Rec #: [id number]       Accession #:    [id number] Date of Birth: September 26, 1966       Patient Gender: M Patient Age:   58 years Exam Location:  [facility] Procedure:      VAS [place] LOWER EXTREMITY VENOUS (DVT) Referring Phys: RAKESH ALVA --------------------------------------------------------------------------------  Indications: Pain.  Limitations: Patient slightly agitate, could not position properly, nor lay still and bilateral amputations. Comparison Study: Previous study on 1.19.2025. Performing Technologist: Fernande Bras  Examination Guidelines: A complete evaluation includes B-mode imaging, spectral Doppler, color Doppler, and power Doppler as needed of all accessible portions of each vessel. Bilateral testing is considered an integral part of a complete examination. Limited examinations for reoccurring indications may be performed as noted. The reflux portion of the exam is performed with the patient in reverse Trendelenburg.  +---------+---------------+---------+-----------+----------+--------------+ RIGHT    CompressibilityPhasicitySpontaneityPropertiesThrombus Aging +---------+---------------+---------+-----------+----------+--------------+ CFV      Full           Yes      Yes                                 +---------+---------------+---------+-----------+----------+--------------+ SFJ      Full           Yes      Yes                                 +---------+---------------+---------+-----------+----------+--------------+ FV Prox  Partial        Yes      Yes                                 +---------+---------------+---------+-----------+----------+--------------+  FV Mid   Partial        Yes      Yes                                 +---------+---------------+---------+-----------+----------+--------------+ FV DistalPartial        Yes      Yes                                  +---------+---------------+---------+-----------+----------+--------------+ PFV      Full                                                        +---------+---------------+---------+-----------+----------+--------------+ Right AKA.  +---------+---------------+---------+-----------+----------+--------------+ LEFT     CompressibilityPhasicitySpontaneityPropertiesThrombus Aging +---------+---------------+---------+-----------+----------+--------------+ CFV      Full           Yes      Yes                                 +---------+---------------+---------+-----------+----------+--------------+ SFJ      Full           Yes      Yes                                 +---------+---------------+---------+-----------+----------+--------------+ FV Prox  Full                                                        +---------+---------------+---------+-----------+----------+--------------+ FV Mid   Full                                                        +---------+---------------+---------+-----------+----------+--------------+ FV DistalFull                                                        +---------+---------------+---------+-----------+----------+--------------+ PFV      Full                                                        +---------+---------------+---------+-----------+----------+--------------+ POP      Full           No       No                                  +---------+---------------+---------+-----------+----------+--------------+ Left BKA.    Summary: RIGHT: -  Findings consistent with age indeterminate deep vein thrombosis involving the right femoral vein.  - No cystic structure found in the popliteal fossa.  LEFT: - There is no evidence of deep vein thrombosis in the lower extremity.  - No cystic structure found in the popliteal fossa.  *See table(s) above for measurements and observations. Electronically signed by Lemar Livings MD on  03/08/2024 at 7:48:30 PM.    Final     Cardiac Studies   Echo 03/04/2024 1. The apex has prominen trabecualtions. There is a large mobile clot in  the LV that appears to be attached to the interventricular septum just  under the mitral valve. . Left ventricular ejection fraction, by  estimation, is <20%. The left ventricle has  severely decreased function. The left ventricle demonstrates global  hypokinesis. Left ventricular diastolic function could not be evaluated.   2. There is a small RV apical clot. Right ventricular systolic function  is severely reduced. The right ventricular size is normal.   3. There is a largr mobile clot in the LA. Left atrial size was mildly  dilated.   4. Right atrial size was mildly dilated.   5. The mitral valve is normal in structure. Trivial mitral valve  regurgitation. No evidence of mitral stenosis.   6. The aortic valve is normal in structure. Aortic valve regurgitation is  not visualized. No aortic stenosis is present.   Conclusion(s)/Recommendation(s): Severe biventricular dysfunction with  large mobile clots in the LA, RV and LV as described above.   Patient Profile     58 y.o. male with CAD (moderate by cath 2020, moderate-severe by coronary CTA in 03/2020 and patient left AMA before further workup completed), chronic HFrEF, CVA Jan 2022, HTN, HLD< DM II, COPD/asthma, schizoaffective disorder, polysubstance abuse (cocaine), noncompliance, prolonged QT, depression, bilateral LE amputee, tobacco abuse and chronic pain who presented with stroke and LV thrombus. Echo showed EF < 20% with large mobile clots in LV and LA as well as small RV apical clot. Duplex also revealed RLE DVT.   Assessment & Plan    Acute CVA with LV, LA, RV clots and RLE DVT - poor candidate for warfarin given compliance issue, transitioned to Eliquis per neurology team  - hypercoagulable work up include factor 5 leiden, lupus anticoagulant negative. Protein C and S low.  Antithrombin III borderline low.   Chronic HFrEF  - euvolemic. CHF medications titrated including increased coreg and losartan and addition of spironolactone.   - increased SOB overnight, CXR yesterday show vascular congestion. SBP remain elevated, will order 40mg  IV lasix, obtain CXR and state EKG. BNP pending.   Moderate to severe CAD by coronary CT 2021  - not a candidate for invasive workup given substance abuse and compliance issue.   Hypertension Hyperlipidemia DM II  Polysubstance abuse: urine drug screen positive for cocaine, opiates, and benzo  Prolonged QTc, NSVT, occasional PVCs - avoid QT prolonging drugs      For questions or updates, please contact [facility] Please consult [email or web address] for contact info under        Signed, Azalee Course, PA  03/10/2024, 9:49 AM

## 2024-03-10 NOTE — Progress Notes (Signed)
 Physical Therapy Treatment Patient Details Name: [name] MRN: [id number] DOB: 1966/10/20 Today's Date: 03/10/2024   History of Present Illness Patient is 58 y.o. male presented to ED on 03/04/24 with stroke like symptoms when caregiver found him with slurred speech and right sided weakness. Head CT reveals Acute left MCA territory infarct affecting the insula, caudate nucleus, lentiform nucleus and internal capsule. PMH significant of  Schizoaffective disorder, heart failure with reduced ejection fraction, pretension, DMII, COPD, s/p bilateral amputations, chronic pain, and polysubstance abuse.    PT Comments  Pt tolerated treatment well today. Pt today was able to perform bed mobility with CGA/Min A then perform seated therex before requesting to lay back down. No change in DC/DME recs at this time. PT will continue to follow.      If plan is discharge home, recommend the following: Two people to help with walking and/or transfers;Two people to help with bathing/dressing/bathroom;Assistance with cooking/housework;Assistance with feeding;Direct supervision/assist for medications management;Assist for transportation;Help with stairs or ramp for entrance;Supervision due to cognitive status   Can travel by private vehicle     No  Equipment Recommendations  Wheelchair cushion (measurements PT);Wheelchair (measurements PT);Hoyer lift;Hospital bed    Recommendations for Other Services       Precautions / Restrictions Precautions Precautions: Fall;Other (comment) Recall of Precautions/Restrictions: Impaired Precaution/Restrictions Comments: h/o BLE amputations Restrictions Weight Bearing Restrictions Per Provider Order: No     Mobility  Bed Mobility Overal bed mobility: Needs Assistance Bed Mobility: Rolling, Supine to Sit, Sit to Supine Rolling: Contact guard assist, Min assist   Supine to sit: Min assist, Used rails Sit to supine: Min assist   General bed mobility comments: Pt  today was able to roll with CGA/Min A for pericare and to change sheets. Used L rail to pull himself into sitting with Min A for scooting hips.    Transfers                   General transfer comment: deferred    Ambulation/Gait                   Stairs             Wheelchair Mobility     Tilt Bed    Modified Rankin (Stroke Patients Only) Modified Rankin (Stroke Patients Only) Pre-Morbid Rankin Score: Slight disability Modified Rankin: Severe disability     Balance Overall balance assessment: Needs assistance Sitting-balance support: Single extremity supported, Feet unsupported Sitting balance-Leahy Scale: Poor                                      Communication Communication Communication: Impaired Factors Affecting Communication: Difficulty expressing self;Reduced clarity of speech  Cognition Arousal: Alert Behavior During Therapy: WFL for tasks assessed/performed, Flat affect   PT - Cognitive impairments: Difficult to assess Difficult to assess due to: Impaired communication                     PT - Cognition Comments: 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 Following commands: Impaired Following commands impaired: Follows one step commands inconsistently, Follows one step commands with increased time    Cueing Cueing Techniques: Verbal cues, Tactile cues, Gestural cues  Exercises Other Exercises Other Exercises: R lateral lean through elbow x 10    General Comments General comments (skin integrity, edema, etc.): VSS      Pertinent Vitals/Pain Pain Assessment Pain  Assessment: No/denies pain    Home Living                          Prior Function            PT Goals (current goals can now be found in the care plan section) Progress towards PT goals: Progressing toward goals    Frequency    Min 2X/week      PT Plan      Co-evaluation              AM-PAC PT "6 Clicks" Mobility   Outcome Measure   Help needed turning from your back to your side while in a flat bed without using bedrails?: A Lot Help needed moving from lying on your back to sitting on the side of a flat bed without using bedrails?: A Lot Help needed moving to and from a bed to a chair (including a wheelchair)?: Total Help needed standing up from a chair using your arms (e.g., wheelchair or bedside chair)?: Total Help needed to walk in hospital room?: Total Help needed climbing 3-5 steps with a railing? : Total 6 Click Score: 8    End of Session         PT Visit Diagnosis: Other abnormalities of gait and mobility (R26.89);Muscle weakness (generalized) (M62.81);Other symptoms and signs involving the nervous system (R29.898);Hemiplegia and hemiparesis Hemiplegia - Right/Left: Right Hemiplegia - caused by: Cerebral infarction     Time: 1444-1500 PT Time Calculation (min) (ACUTE ONLY): 16 min  Charges:    $Therapeutic Activity: 8-22 mins PT General Charges $$ ACUTE PT VISIT: 1 Visit                     Shela Nevin, PT, DPT Acute Rehab Services [phone]    Gladys Damme 03/10/2024, 4:45 PM

## 2024-03-11 DIAGNOSIS — I240 Acute coronary thrombosis not resulting in myocardial infarction: Secondary | ICD-10-CM | POA: Diagnosis not present

## 2024-03-11 DIAGNOSIS — I5022 Chronic systolic (congestive) heart failure: Secondary | ICD-10-CM | POA: Diagnosis not present

## 2024-03-11 DIAGNOSIS — I639 Cerebral infarction, unspecified: Secondary | ICD-10-CM | POA: Diagnosis not present

## 2024-03-11 LAB — GLUCOSE, CAPILLARY
Glucose-Capillary: 104 mg/dL — ABNORMAL HIGH (ref 70–99)
Glucose-Capillary: 106 mg/dL — ABNORMAL HIGH (ref 70–99)
Glucose-Capillary: 111 mg/dL — ABNORMAL HIGH (ref 70–99)
Glucose-Capillary: 131 mg/dL — ABNORMAL HIGH (ref 70–99)
Glucose-Capillary: 131 mg/dL — ABNORMAL HIGH (ref 70–99)
Glucose-Capillary: 133 mg/dL — ABNORMAL HIGH (ref 70–99)
Glucose-Capillary: 190 mg/dL — ABNORMAL HIGH (ref 70–99)

## 2024-03-11 MED ORDER — FUROSEMIDE 40 MG PO TABS
40.0000 mg | ORAL_TABLET | Freq: Every day | ORAL | Status: DC
  Administered 2024-03-12 – 2024-08-17 (×77): 40 mg via ORAL
  Filled 2024-03-11 (×58): qty 1
  Filled 2024-03-11: qty 2
  Filled 2024-03-11 (×52): qty 1

## 2024-03-11 MED ORDER — OXYCODONE HCL 5 MG PO TABS
5.0000 mg | ORAL_TABLET | ORAL | Status: DC | PRN
  Administered 2024-03-11 – 2024-04-06 (×54): 5 mg via ORAL
  Filled 2024-03-11 (×59): qty 1

## 2024-03-11 NOTE — Progress Notes (Signed)
 PROGRESS NOTE  [name] JYN:[id number] DOB: December 21, 1965 DOA: 03/04/2024 PCP: Georganna Skeans, MD   LOS: 7 days   Brief narrative:  58 year old male with past medical history of cardiomyopathy, active cocaine abuse presented to the hospital with slurred speech and confusion with right sided weakness.  Code stroke was activated and was brought into the hospital.  CT head showed left MCA infarct, CTA head and neck showed M2 occlusion on the left.  2D echocardiogram showed large mobile LV thrombus, LA thrombus and RV apical thrombus with EF of 20%.  Neurology and cardiology were consulted and due to high risk of emboli decision was made to anticoagulate and put the patient in the ICU.  LV thrombus more than 3 cm in the size.  Subsequently, patient was transferred out of the ICU under [facility] care again on 03/07/2024.    Assessment/Plan: Principal Problem:   Acute CVA (cerebrovascular accident) (HCC) Active Problems:   Acute thrombus of left ventricle (HCC)   Chronic systolic CHF (congestive heart failure) (HCC)   Prolonged QT interval   Elevated liver enzymes   COPD (chronic obstructive pulmonary disease) (HCC)   Cocaine abuse (HCC)   Schizoaffective disorder, bipolar type (HCC)   Benign essential HTN   Acid reflux   History of DVT (deep vein thrombosis)   Type 2 diabetes mellitus (HCC)   Status post bilateral BKA.   Dilated cardiomyopathy (HCC)  Acute left MCA CVA/hyperlipidemia:  CT head with acute left MCA infarct with M2 occlusion on the left..  Initially admitted to ICU with anticoagulation due to cardiac thrombus.  MRI of the brain showed a left basal ganglia and insula punctuate foci of ischemia.. 2D Echo showed large mobile clot in the LV that appears to be attached to the interventricular septum just under the mitral valve.  LVEF <20%.  The left ventricle has severely decreased function with global hypokinesis.  There is a small RV apical clot.  Right ventricular systolic function is  severely reduced.  There is a larger mobile clot in the LA.  Right and left atria are mildly dilated.  LDL was 71 hemoglobin A1c 5.8.  Patient has been transitioned to Eliquis for intracardiac thrombus.  Neurology has signed off at this time.  Dysphagia: Has been started on dysphagia 1 diet on 03/06/2024 and advanced to dysphagia 3 diet on 03/07/2024.   Respiratory insufficiency:  Afternoon of 03/07/2024, patient was noted to have respiratory insufficiency and hypoxia requiring 4 L of oxygen.  Chest x-ray was repeated which actually showed improved opacity.  Hypoxia likely secondary to atelectasis.  Continue incentive spirometry.  Repeat chest x-ray was done by cardiology 03/09/2024 which shows vascular congestion.  Currently on nasal cannula oxygen.   Hypoglycemia:  Likely secondary to poor oral intake.  Encourage oral nutrition.   RV/LA/LV thrombus -high risk of embolization.  No PFO.  Hypercoagulable workup in process.  Has been transitioned to Eliquis at this time.     Hypertension: Now on Coreg and losartan.  Intermittent blood pressure elevation noted..   HFrEF -EF 20%.  On Aldactone, Coreg and losartan.  Not on any diuretics.  Received 1 dose of IV Lasix yesterday.  Cardiology following.  Cardiology adjusting medications.   Schizoaffective disorder: Continue Abilify.   COPD -continue bronchodilators   Chronic pain: Continue oxycodone from home, will change to Q4 hourly from Q8 hourly.   Polysubstance abuse: UDS positive for opiates, cocaine and benzos.  Counseling done.   GOC: Patient has overall poor short and  long-term prognosis.  Consulted palliative care for goals of care discussion..   DVT prophylaxis:  apixaban (ELIQUIS) tablet 5 mg   Disposition: Skilled nursing facility placement, will consult TOC.  Status is: Inpatient Remains inpatient appropriate because: Need for rehabilitation pending clinical improvement.    Code Status:     Code Status: Full Code  Family  Communication: None at bedside  Consultants: Cardiology Neurology  Procedures: None  Anti-infectives:  None  Anti-infectives (From admission, onward)    None       Subjective: Today, patient was seen and examined at bedside.  Nursing staff reported that patient was refusing medications and blood work.  Spoke with the patient in detail regarding this including his medical condition.  Denies any dyspnea, chest pain but has some feeling of congestion.  Denies any nausea or vomiting but complains of moderate body pain.  Objective: Vitals:   03/11/24 1106 03/11/24 1107  BP: 129/85 129/85  Pulse: 68 68  Resp: 20   Temp: 97.7 F (36.5 C)   SpO2: 100%     Intake/Output Summary (Last 24 hours) at 03/11/2024 1313 Last data filed at 03/11/2024 0600 Gross per 24 hour  Intake 500 ml  Output 1100 ml  Net -600 ml   Filed Weights   03/09/24 0500 03/10/24 0440 03/11/24 0500  Weight: 78.7 kg 78.6 kg 78.4 kg   Body mass index is 25.52 kg/m.   Physical Exam:  GENERAL: Patient is alert awake and Communicative, slow to respond, not in obvious distress. HENT: No scleral pallor or icterus. Pupils equally reactive to light. Oral mucosa is moist NECK: is supple, no gross swelling noted. CHEST: Clear to auscultation. No crackles or wheezes.   CVS: S1 and S2 heard, no murmur. Regular rate and rhythm.  ABDOMEN: Soft, non-tender, bowel sounds are present. EXTREMITIES: Bilateral below-knee amputation. CNS: Expressive aphasia with right hemiparesis and right facial droop  SKIN: warm and dry without rashes.  Data Review: I have personally reviewed the following laboratory data and studies,  CBC: Recent Labs  Lab 03/04/24 1436 03/05/24 0609 03/07/24 1009 03/08/24 0533 03/09/24 0550 03/10/24 0601  WBC 6.6 6.4 5.4 5.6 5.5 6.4  NEUTROABS 4.2  --   --   --   --   --   HGB 14.3  15.0 13.9 13.9 12.9* 13.6 14.9  HCT 43.8  44.0 42.3 42.3 38.3* 40.7 45.1  MCV 93.8 93.4 93.2 92.7 93.8  94.2  PLT 210 229 199 187 189 223   Basic Metabolic Panel: Recent Labs  Lab 03/04/24 1436 03/04/24 1621 03/05/24 0609 03/07/24 1009 03/09/24 1045 03/10/24 0601  NA 138  141  --  142 140  --  138  K 3.7  3.6  --  3.7 4.0  --  4.6  CL 108  109  --  112* 109  --  102  CO2 19*  --  19* 22  --  23  GLUCOSE 136*  136*  --  74 100*  --  108*  BUN 17  19  --  13 15  --  14  CREATININE 1.04  1.00  --  1.08 0.99  --  1.05  CALCIUM 7.9*  --  8.3* 8.6*  --  9.1  MG  --  1.3*  --   --  1.7 2.0   Liver Function Tests: Recent Labs  Lab 03/04/24 1436 03/10/24 0601  AST 59* 17  ALT 87* 30  ALKPHOS 67 63  BILITOT 2.4* 1.6*  PROT 5.7* 7.0  ALBUMIN 2.9* 3.3*   No results for input(s): "LIPASE", "AMYLASE" in the last 168 hours. No results for input(s): "AMMONIA" in the last 168 hours. Cardiac Enzymes: No results for input(s): "CKTOTAL", "CKMB", "CKMBINDEX", "TROPONINI" in the last 168 hours. BNP (last 3 results) Recent Labs    01/13/24 1936 01/16/24 1954 03/10/24 0601  BNP 2,455.2* 716.5* 1,475.2*    ProBNP (last 3 results) No results for input(s): "PROBNP" in the last 8760 hours.  CBG: Recent Labs  Lab 03/10/24 2043 03/10/24 2358 03/11/24 0345 03/11/24 0814 03/11/24 1107  GLUCAP 136* 133* 131* 131* 111*   Recent Results (from the past 240 hours)  MRSA Next Gen by PCR, Nasal     Status: None   Collection Time: 03/04/24 11:10 PM   Specimen: Nasal Mucosa; Nasal Swab  Result Value Ref Range Status   MRSA by PCR Next Gen NOT DETECTED NOT DETECTED Final    Comment: (NOTE) The GeneXpert MRSA Assay (FDA approved for NASAL specimens only), is one component of a comprehensive MRSA colonization surveillance program. It is not intended to diagnose MRSA infection nor to guide or monitor treatment for MRSA infections. Test performance is not FDA approved in patients less than 71 years old. Performed at [facility], [address] [place], [name] [address]       Studies: DG CHEST PORT 1 VIEW Result Date: 03/10/2024 CLINICAL DATA:  Dyspnea, difficulty breathing EXAM: PORTABLE CHEST 1 VIEW COMPARISON:  03/09/2024 FINDINGS: Single frontal view of the chest demonstrates stable enlargement of the cardiac silhouette. Persistent pulmonary vascular congestion, with patchy areas of consolidation developing at the lung bases favoring mild edema. No effusion or pneumothorax. No acute bony abnormalities. IMPRESSION: 1. Findings compatible with congestive heart failure and mild bibasilar edema. Electronically Signed   By: Sharlet Salina M.D.   On: 03/10/2024 14:57   DG Chest Port 1 View Result Date: 03/10/2024 CLINICAL DATA:  Shortness of breath EXAM: PORTABLE CHEST 1 VIEW COMPARISON:  03/07/2024 FINDINGS: Cardiomegaly with vascular congestion. No acute airspace disease, pleural effusion or pneumothorax. Aortic atherosclerosis IMPRESSION: Cardiomegaly with vascular congestion. Electronically Signed   By: Jasmine Pang M.D.   On: 03/10/2024 01:47      Joycelyn Das, MD  [facility] 03/11/2024  If 7PM-7AM, please contact night-coverage

## 2024-03-11 NOTE — TOC Initial Note (Signed)
 Transition of Care [facility]) - Initial/Assessment Note    Patient Details  Name: [name] MRN: [id number] Date of Birth: 12-03-1965  Transition of Care [facility]) CM/SW Contact:    Baldemar Lenis, [name] Phone Number: 03/11/2024, 3:27 PM  Clinical Narrative:     CSW acknowledging consult for disposition planning. Patient well known to [facility], from apartment alone with PCS services, utilizes wheelchair at baseline. Patient not likely to get SNF placement, does not have payor source for SNF and has history of substance abuse. CSW noting current consult for palliative care. CSW to follow.          Expected Discharge Plan: Skilled Nursing Facility Barriers to Discharge: Continued Medical Work up, English as a second language teacher, Active Substance Use - Placement, Inadequate or no insurance, SNF Pending payor source - LOG, SNF Pending Medicaid, SNF Pending bed offer   Patient Goals and CMS Choice            Expected Discharge Plan and Services       Living arrangements for the past 2 months: Apartment                                      Prior Living Arrangements/Services Living arrangements for the past 2 months: Apartment Lives with:: Self              Current home services: DME    Activities of Daily Living      Permission Sought/Granted                  Emotional Assessment              Admission diagnosis:  Acute CVA (cerebrovascular accident) [facility]) [I63.9] Patient Active Problem List   Diagnosis Date Noted   Acute CVA (cerebrovascular accident) (HCC) 03/04/2024   Chronic systolic CHF (congestive heart failure) (HCC) 03/04/2024   Benign essential HTN 03/04/2024   Acute thrombus of left ventricle (HCC) 03/04/2024   Elevated liver enzymes 03/04/2024   Dilated cardiomyopathy (HCC) 03/04/2024   Heart failure with reduced ejection fraction (HCC) 01/02/2024   Acute on chronic HFrEF (heart failure with reduced ejection fraction) (HCC) 12/20/2023    Cocaine use 10/23/2023   COPD (chronic obstructive pulmonary disease) (HCC) 11/25/2022   Pulmonary emboli (HCC) 08/17/2022   Venous ulcer (HCC) 07/23/2022   Sinus bradycardia 07/23/2022   Anemia of chronic disease 07/23/2022   Status post bilateral BKA. 07/23/2022   Acute on chronic systolic congestive heart failure (HCC) 07/12/2022   History of DVT (deep vein thrombosis) 07/12/2022   CAP (community acquired pneumonia) 07/12/2022   Chronic venous hypertension (idiopathic) with ulcer and inflammation of bilateral lower extremity (HCC)    Normocytic anemia 04/04/2022   Acute deep vein thrombosis (DVT) of brachial vein (HCC) 03/31/2022   Swelling of lower extremity 03/23/2022   SIRS (systemic inflammatory response syndrome) (HCC) 03/23/2022   Leg swelling 03/23/2022   Acute on chronic systolic heart failure (HCC) 02/26/2022   Venous stasis ulcer of right ankle limited to breakdown of skin with varicose veins (HCC)    Hypertensive urgency 01/29/2022   Prolonged QT interval 01/29/2022   Alcohol abuse 01/29/2022   chronic ulcers 01/29/2022   Wound infection 12/04/2021   Mixed diabetic hyperlipidemia associated with type 2 diabetes mellitus (HCC) 12/03/2021   Cellulitis of right lower extremity 11/13/2021   Type 2 diabetes mellitus (HCC) 11/13/2021   Antisocial personality disorder (HCC) 11/04/2021  Malingering 11/04/2021   Foot pain 04/23/2021   Chronic combined systolic and diastolic congestive heart failure (HCC) 03/02/2021   History of stroke 03/02/2021   Polysubstance abuse (HCC) 03/02/2021   Skin ulcer of right great toe with necrosis of bone (HCC)    Encounter for assessment of healthcare decision-making capacity    Pain, dental    Dystonia    Dysarthria    Shock liver 01/04/2021   Acute metabolic encephalopathy 01/04/2021   Coronary artery disease involving native coronary artery of native heart without angina pectoris 01/04/2021   Essential hypertension 01/04/2021    Bipolar I disorder (HCC) 01/04/2021   Cocaine abuse (HCC) 01/04/2021   Homelessness 01/04/2021   HLD (hyperlipidemia) 01/04/2021   Osteomyelitis of great toe (HCC)    Ulcer of great toe, left, with necrosis of bone (HCC)    Ulcer of great toe, right, with necrosis of muscle (HCC)    Acute osteomyelitis of toe, left (HCC)    Acute on chronic combined systolic and diastolic CHF (congestive heart failure) (HCC) 12/20/2020   Acute respiratory failure with hypoxia (HCC)    Ischemic stroke (HCC) 12/17/2020   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   Diabetic foot ulcer (HCC) 12/08/2020   CHF (congestive heart failure) (HCC) 11/14/2020   CHF exacerbation (HCC) 11/13/2020   Hypokalemia    Hyperlipidemia    Acute hypoxemic respiratory failure (HCC) 10/26/2020   COPD with acute exacerbation (HCC) 10/14/2020   Acute on chronic combined systolic and diastolic CHF (congestive heart failure) (HCC) 10/02/2020   Heart failure with reduced ejection fraction and diastolic dysfunction (HCC) 09/07/2020   Near syncope 07/21/2020   Acid reflux 06/03/2020   Cannabis abuse 03/19/2020   Schizoaffective disorder (HCC) 06/24/2019   SOB (shortness of breath)    Cocaine abuse (HCC)    Elevated troponin    Homeless    Suicidal ideation    Bilateral lower extremity pain 04/05/2019   Housing situation unstable 03/28/2019   Bilateral lower extremity edema    Acute exacerbation of CHF (congestive heart failure) (HCC) 03/04/2019   Alcohol withdrawal syndrome, with delirium (HCC) 05/03/2018   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   Mild intermittent asthma without complication 08/28/2017   Asthma 12/26/2016   Severe episode of recurrent major depressive disorder, with psychotic features (HCC) 10/31/2016   Cocaine abuse with cocaine-induced mood disorder (HCC) 09/06/2016   Alcohol use disorder, mild, abuse 07/31/2016   Substance induced mood disorder (HCC) 07/30/2016   Schizoaffective  disorder, bipolar type (HCC) 05/31/2016   Cocaine use disorder, severe, dependence (HCC) 05/31/2016   Major depressive disorder, recurrent episode (HCC) 05/30/2016   Suicidal behavior with attempted self-injury (HCC) 05/08/2016   Acetaminophen overdose 05/06/2016   PCP:  Georganna Skeans, MD Pharmacy:   [facility] - [place], [name] - [address] [facility] [address] [name] [address] Phone: [phone] Fax: [phone]     Social Drivers of Health (SDOH) Social History: SDOH Screenings   Food Insecurity: Food Insecurity Present (03/06/2024)  Housing: High Risk (03/06/2024)  Transportation Needs: No Transportation Needs (03/06/2024)  Utilities: Patient Declined (03/06/2024)  Alcohol Screen: Medium Risk (06/24/2019)  Depression (PHQ2-9): Low Risk  (02/18/2024)  Recent Concern: Depression (PHQ2-9) - Medium Risk (01/21/2024)  Financial Resource Strain: High Risk (07/24/2021)   Received from [facility]  Physical Activity: Inactive (02/18/2024)  Social Connections: Unknown (04/05/2022)   Received from [facility], [facility]  Stress: Stress Concern Present (07/24/2021)  Received from [facility]  Tobacco Use: High Risk (03/04/2024)  Health Literacy: Adequate Health Literacy (02/18/2024)   SDOH Interventions: Food Insecurity Interventions: Inpatient TOC Housing Interventions: Intervention Not Indicated   Readmission Risk Interventions    12/21/2023    9:25 AM 12/09/2022    9:02 AM 12/01/2022    3:56 PM  Readmission Risk Prevention Plan  Transportation Screening Complete Complete Complete  HRI or Home Care Consult Complete    Social Work Consult for Recovery Care Planning/Counseling Complete    Palliative Care Screening Not Applicable    Medication Review Oceanographer) Complete Complete Complete  PCP or Specialist appointment within 3-5 days of discharge  Complete Complete  HRI or Home Care Consult  Complete Complete   SW Recovery Care/Counseling Consult  Complete Complete  Palliative Care Screening  Not Applicable Not Applicable  Skilled Nursing Facility  Not Applicable Not Applicable  SNF Comments   n/a

## 2024-03-11 NOTE — Plan of Care (Signed)
  Problem: Nutrition: Goal: Risk of aspiration will decrease Outcome: Progressing Goal: Dietary intake will improve Outcome: Progressing   Problem: Health Behavior/Discharge Planning: Goal: Ability to manage health-related needs will improve Outcome: Progressing   Problem: Clinical Measurements: Goal: Ability to maintain clinical measurements within normal limits will improve Outcome: Progressing Goal: Will remain free from infection Outcome: Progressing Goal: Diagnostic test results will improve Outcome: Progressing Goal: Respiratory complications will improve Outcome: Progressing Goal: Cardiovascular complication will be avoided Outcome: Progressing   Problem: Activity: Goal: Risk for activity intolerance will decrease Outcome: Progressing   Problem: Nutrition: Goal: Adequate nutrition will be maintained Outcome: Progressing   Problem: Coping: Goal: Level of anxiety will decrease Outcome: Progressing   Problem: Elimination: Goal: Will not experience complications related to bowel motility Outcome: Progressing Goal: Will not experience complications related to urinary retention Outcome: Progressing   Problem: Pain Managment: Goal: General experience of comfort will improve and/or be controlled Outcome: Progressing   Problem: Safety: Goal: Ability to remain free from injury will improve Outcome: Progressing   Problem: Skin Integrity: Goal: Risk for impaired skin integrity will decrease Outcome: Progressing

## 2024-03-11 NOTE — Progress Notes (Signed)
 Patient refusing vital signs and AM dose of coreg this morning.  Dr. Tyson Babinski notified via secure chat.  Will attempt to give dose and obtain vital signs later this morning.

## 2024-03-11 NOTE — Progress Notes (Signed)
 [name] was agreeable to take morning medications but then changed his mind. Refusing all medications at this time. Phlebotomy at bedside to draw AM labs ordered by Attending and Cardiology providers, patient refused to have labs drawn at this time.

## 2024-03-11 NOTE — Plan of Care (Signed)
 Refusing meds and vital signs intermittently.   Problem: Education: Goal: Knowledge of disease or condition will improve Outcome: Not Progressing Goal: Knowledge of secondary prevention will improve (MUST DOCUMENT ALL) Outcome: Not Progressing Goal: Knowledge of patient specific risk factors will improve (DELETE if not current risk factor) Outcome: Not Progressing   Problem: Ischemic Stroke/TIA Tissue Perfusion: Goal: Complications of ischemic stroke/TIA will be minimized Outcome: Not Progressing   Problem: Coping: Goal: Will verbalize positive feelings about self Outcome: Not Progressing

## 2024-03-11 NOTE — Progress Notes (Signed)
 Rounding Note    Patient Name: [name] Date of Encounter: 03/11/2024  [facility] Cardiologist: Rollene Rotunda, MD   Subjective   Significant improvement over night after diuresis and good urine output. Able to breath better now, also able to speak and communicate today. Refused coreg this morning.   Inpatient Medications    Scheduled Meds:  apixaban  5 mg Oral BID   arformoterol  15 mcg Nebulization BID   ARIPiprazole  5 mg Oral Daily   budesonide (PULMICORT) nebulizer solution  0.5 mg Nebulization BID   carvedilol  6.25 mg Oral BID WC   feeding supplement  237 mL Oral BID BM   losartan  50 mg Oral Daily   nicotine  14 mg Transdermal Daily   pantoprazole  40 mg Oral Daily   revefenacin  175 mcg Nebulization Daily   rosuvastatin  5 mg Oral QHS   spironolactone  12.5 mg Oral Daily   Continuous Infusions:  PRN Meds: albuterol, oxyCODONE   Vital Signs    Vitals:   03/11/24 0344 03/11/24 0500 03/11/24 0727 03/11/24 0810  BP: 137/88   (!) 139/106  Pulse: 68  76 66  Resp: 16  20 19   Temp: 99 F (37.2 C)   97.6 F (36.4 C)  TempSrc:    Oral  SpO2: 100%   99%  Weight:  78.4 kg    Height:        Intake/Output Summary (Last 24 hours) at 03/11/2024 0833 Last data filed at 03/11/2024 0600 Gross per 24 hour  Intake 500 ml  Output 1575 ml  Net -1075 ml      03/11/2024    5:00 AM 03/10/2024    4:40 AM 03/09/2024    5:00 AM  Last 3 Weights  Weight (lbs) 172 lb 13.5 oz 173 lb 4.5 oz 173 lb 8 oz  Weight (kg) 78.4 kg 78.6 kg 78.7 kg      Telemetry    NSR without significant ventricular ectopy - Personally Reviewed  ECG    NSR with TWI in the anterior leads - Personally Reviewed  Physical Exam   GEN: No acute distress.  Resting in bed comfortably Neck: No JVD Cardiac: RRR, no murmurs, rubs, or gallops.  Respiratory: occasional rhonchi, much improved.  GI: Soft, nontender, non-distended  MS: bilateral LE amputation Neuro:  Nonfocal  Psych:  Normal affect   Labs    High Sensitivity Troponin:  No results for input(s): "TROPONINIHS" in the last 720 hours.   Chemistry Recent Labs  Lab 03/04/24 1436 03/04/24 1621 03/05/24 0609 03/07/24 1009 03/09/24 1045 03/10/24 0601  NA 138  141  --  142 140  --  138  K 3.7  3.6  --  3.7 4.0  --  4.6  CL 108  109  --  112* 109  --  102  CO2 19*  --  19* 22  --  23  GLUCOSE 136*  136*  --  74 100*  --  108*  BUN 17  19  --  13 15  --  14  CREATININE 1.04  1.00  --  1.08 0.99  --  1.05  CALCIUM 7.9*  --  8.3* 8.6*  --  9.1  MG  --  1.3*  --   --  1.7 2.0  PROT 5.7*  --   --   --   --  7.0  ALBUMIN 2.9*  --   --   --   --  3.3*  AST 59*  --   --   --   --  17  ALT 87*  --   --   --   --  30  ALKPHOS 67  --   --   --   --  63  BILITOT 2.4*  --   --   --   --  1.6*  GFRNONAA >60  --  >60 >60  --  >60  ANIONGAP 11  --  11 9  --  13    Lipids  Recent Labs  Lab 03/05/24 0609  CHOL 110  TRIG 51  HDL 29*  LDLCALC 71  CHOLHDL 3.8    Hematology Recent Labs  Lab 03/08/24 0533 03/09/24 0550 03/10/24 0601  WBC 5.6 5.5 6.4  RBC 4.13* 4.34 4.79  HGB 12.9* 13.6 14.9  HCT 38.3* 40.7 45.1  MCV 92.7 93.8 94.2  MCH 31.2 31.3 31.1  MCHC 33.7 33.4 33.0  RDW 16.6* 16.7* 16.6*  PLT 187 189 223   Thyroid  Recent Labs  Lab 03/10/24 0601  TSH 6.039*    BNP Recent Labs  Lab 03/10/24 0601  BNP 1,475.2*    DDimer No results for input(s): "DDIMER" in the last 168 hours.   Radiology    DG CHEST PORT 1 VIEW Result Date: 03/10/2024 CLINICAL DATA:  Dyspnea, difficulty breathing EXAM: PORTABLE CHEST 1 VIEW COMPARISON:  03/09/2024 FINDINGS: Single frontal view of the chest demonstrates stable enlargement of the cardiac silhouette. Persistent pulmonary vascular congestion, with patchy areas of consolidation developing at the lung bases favoring mild edema. No effusion or pneumothorax. No acute bony abnormalities. IMPRESSION: 1. Findings compatible with congestive heart failure and  mild bibasilar edema. Electronically Signed   By: Sharlet Salina M.D.   On: 03/10/2024 14:57   DG Chest Port 1 View Result Date: 03/10/2024 CLINICAL DATA:  Shortness of breath EXAM: PORTABLE CHEST 1 VIEW COMPARISON:  03/07/2024 FINDINGS: Cardiomegaly with vascular congestion. No acute airspace disease, pleural effusion or pneumothorax. Aortic atherosclerosis IMPRESSION: Cardiomegaly with vascular congestion. Electronically Signed   By: Jasmine Pang M.D.   On: 03/10/2024 01:47    Cardiac Studies   Echo 03/04/2024 1. The apex has prominen trabecualtions. There is a large mobile clot in  the LV that appears to be attached to the interventricular septum just  under the mitral valve. . Left ventricular ejection fraction, by  estimation, is <20%. The left ventricle has  severely decreased function. The left ventricle demonstrates global  hypokinesis. Left ventricular diastolic function could not be evaluated.   2. There is a small RV apical clot. Right ventricular systolic function  is severely reduced. The right ventricular size is normal.   3. There is a largr mobile clot in the LA. Left atrial size was mildly  dilated.   4. Right atrial size was mildly dilated.   5. The mitral valve is normal in structure. Trivial mitral valve  regurgitation. No evidence of mitral stenosis.   6. The aortic valve is normal in structure. Aortic valve regurgitation is  not visualized. No aortic stenosis is present.   Conclusion(s)/Recommendation(s): Severe biventricular dysfunction with  large mobile clots in the LA, RV and LV as described above.   Patient Profile     58 y.o. male with CAD (moderate by cath 2020, moderate-severe by coronary CTA in 03/2020 and patient left AMA before further workup completed), chronic HFrEF, CVA Jan 2022, HTN, HLD< DM II, COPD/asthma, schizoaffective disorder, polysubstance abuse (cocaine), noncompliance,  prolonged QT, depression, bilateral LE amputee, tobacco abuse and chronic  pain who presented with stroke and LV thrombus. Echo showed EF < 20% with large mobile clots in LV and LA as well as small RV apical clot. Duplex also revealed RLE DVT.   Assessment & Plan    Acute CVA with LV, LA, RV clots and RLE DVT - poor candidate for warfarin given compliance issue, transitioned to Eliquis per neurology team  - hypercoagulable work up include factor 5 leiden, lupus anticoagulant negative. Protein C and S low. Antithrombin III borderline low.   Chronic HFrEF  - euvolemic. CHF medications titrated including increased coreg and losartan and addition of spironolactone.   - BNP elevated on 4/10 with increased work of breathing. CXR showed pulm edema. EKG no significant ischemic change. Given 40mg  IV lasix and had significant urine output. Now resting comfortably, will order 40mg  daily oral lasix. BMET today  Moderate to severe CAD by coronary CT 2021  - not a candidate for invasive workup given substance abuse and compliance issue.   Hypertension Hyperlipidemia DM II  Polysubstance abuse: urine drug screen positive for cocaine, opiates, and benzo  Prolonged QTc, NSVT, occasional PVCs - avoid QT prolonging drugs      For questions or updates, please contact [facility] Please consult [email or web address] for contact info under        Signed, Azalee Course, PA  03/11/2024, 8:33 AM

## 2024-03-12 DIAGNOSIS — F1410 Cocaine abuse, uncomplicated: Secondary | ICD-10-CM | POA: Diagnosis not present

## 2024-03-12 DIAGNOSIS — Z515 Encounter for palliative care: Secondary | ICD-10-CM | POA: Diagnosis not present

## 2024-03-12 DIAGNOSIS — I240 Acute coronary thrombosis not resulting in myocardial infarction: Secondary | ICD-10-CM | POA: Diagnosis not present

## 2024-03-12 DIAGNOSIS — Z7189 Other specified counseling: Secondary | ICD-10-CM | POA: Diagnosis not present

## 2024-03-12 DIAGNOSIS — I639 Cerebral infarction, unspecified: Secondary | ICD-10-CM | POA: Diagnosis not present

## 2024-03-12 DIAGNOSIS — I513 Intracardiac thrombosis, not elsewhere classified: Secondary | ICD-10-CM | POA: Diagnosis not present

## 2024-03-12 DIAGNOSIS — I5022 Chronic systolic (congestive) heart failure: Secondary | ICD-10-CM

## 2024-03-12 LAB — GLUCOSE, CAPILLARY
Glucose-Capillary: 103 mg/dL — ABNORMAL HIGH (ref 70–99)
Glucose-Capillary: 91 mg/dL (ref 70–99)
Glucose-Capillary: 114 mg/dL — ABNORMAL HIGH (ref 70–99)
Glucose-Capillary: 123 mg/dL — ABNORMAL HIGH (ref 70–99)
Glucose-Capillary: 103 mg/dL — ABNORMAL HIGH (ref 70–99)

## 2024-03-12 NOTE — Progress Notes (Signed)
 Rounding Note    Patient Name: [name] Date of Encounter: 03/12/2024  [facility] Cardiologist: Eilleen Grates, MD   Subjective   58 year old gentleman with moderate coronary artery disease, acute on chronic HFrEF, CVA, hypertension, diabetes mellitus, COPD, schizoaffective disorder, polysubstance abuse including cocaine, noncompliance.  He was admitted with a large stroke.  He was found to have LV thrombus as well as RV thrombus.  Echocardiogram shows LVEF severely reduced with an EF of less than 20%.  He has large mobile clots in the left ventricle and left atrium as well as a small RV apical clot.  He has been started on Eliquis.  Hypercoagulable workup is underway.  He is diuresed 2.2 L so far during this hospitalization.  Inpatient Medications    Scheduled Meds:  apixaban  5 mg Oral BID   arformoterol  15 mcg Nebulization BID   ARIPiprazole  5 mg Oral Daily   budesonide (PULMICORT) nebulizer solution  0.5 mg Nebulization BID   carvedilol  6.25 mg Oral BID WC   feeding supplement  237 mL Oral BID BM   furosemide  40 mg Oral Daily   losartan  50 mg Oral Daily   nicotine  14 mg Transdermal Daily   pantoprazole  40 mg Oral Daily   revefenacin  175 mcg Nebulization Daily   rosuvastatin  5 mg Oral QHS   spironolactone  12.5 mg Oral Daily   Continuous Infusions:  PRN Meds: albuterol, oxyCODONE   Vital Signs    Vitals:   03/11/24 2325 03/12/24 0402 03/12/24 0758 03/12/24 0818  BP: (!) 147/91 (!) 135/92  (!) 147/107  Pulse: 78 82 86 80  Resp:   (!) 21 18  Temp: 98 F (36.7 C) (!) 97.4 F (36.3 C)  97.7 F (36.5 C)  TempSrc: Oral Oral  Oral  SpO2: 96% 100% 99% 100%  Weight:      Height:        Intake/Output Summary (Last 24 hours) at 03/12/2024 1204 Last data filed at 03/12/2024 0400 Gross per 24 hour  Intake --  Output 725 ml  Net -725 ml      03/11/2024    5:00 AM 03/10/2024    4:40 AM 03/09/2024    5:00 AM  Last 3 Weights  Weight (lbs)  172 lb 13.5 oz 173 lb 4.5 oz 173 lb 8 oz  Weight (kg) 78.4 kg 78.6 kg 78.7 kg      Telemetry     - Personally Reviewed  ECG     - Personally Reviewed  Physical Exam   GEN: No acute distress.   Neck: No JVD Cardiac: RRR, no murmurs, rubs, or gallops.  Respiratory: Clear to auscultation bilaterally. GI: Soft, nontender, non-distended  MS:  bilateral amputee  Neuro:  Nonfocal  Psych: s/p stroke, speech is difficult to understand  Labs    High Sensitivity Troponin:  No results for input(s): "TROPONINIHS" in the last 720 hours.   Chemistry Recent Labs  Lab 03/07/24 1009 03/09/24 1045 03/10/24 0601  NA 140  --  138  K 4.0  --  4.6  CL 109  --  102  CO2 22  --  23  GLUCOSE 100*  --  108*  BUN 15  --  14  CREATININE 0.99  --  1.05  CALCIUM 8.6*  --  9.1  MG  --  1.7 2.0  PROT  --   --  7.0  ALBUMIN  --   --  3.3*  AST  --   --  17  ALT  --   --  30  ALKPHOS  --   --  63  BILITOT  --   --  1.6*  GFRNONAA >60  --  >60  ANIONGAP 9  --  13    Lipids No results for input(s): "CHOL", "TRIG", "HDL", "LABVLDL", "LDLCALC", "CHOLHDL" in the last 168 hours.  Hematology Recent Labs  Lab 03/08/24 0533 03/09/24 0550 03/10/24 0601  WBC 5.6 5.5 6.4  RBC 4.13* 4.34 4.79  HGB 12.9* 13.6 14.9  HCT 38.3* 40.7 45.1  MCV 92.7 93.8 94.2  MCH 31.2 31.3 31.1  MCHC 33.7 33.4 33.0  RDW 16.6* 16.7* 16.6*  PLT 187 189 223   Thyroid  Recent Labs  Lab 03/10/24 0601  TSH 6.039*    BNP Recent Labs  Lab 03/10/24 0601  BNP 1,475.2*    DDimer No results for input(s): "DDIMER" in the last 168 hours.   Radiology    No results found.  Cardiac Studies     Patient Profile     58 y.o. male   Assessment & Plan     Acute CVA with LV, LA, RV thrombi, DVT .  He is on eliquis  2.  Chronic HFrEF :   difficult to treat given compliance issues   Continue coreg, lasix losartan , spiro His prognosis is poor .  Palliative care has been consulted.    3.  CAD :   he is not a  candidate for invasive cardiac procedure          For questions or updates, please contact [facility] Please consult [email or web address] for contact info under        Signed, Ahmad Alert, MD  03/12/2024, 12:04 PM

## 2024-03-12 NOTE — Progress Notes (Signed)
 Progress Note   Patient: [name] [id number]:[id number] DOB: 1966/11/18 DOA: 03/04/2024     8 DOS: the patient was seen and examined on 03/12/2024   Brief hospital course: 58 year old male with past medical history of cardiomyopathy, active cocaine abuse presented to the hospital with slurred speech and confusion with right sided weakness.  Code stroke was activated and was brought into the hospital.  CT head showed left MCA infarct, CTA head and neck showed M2 occlusion on the left.  2D echocardiogram showed large mobile LV thrombus, LA thrombus and RV apical thrombus with EF of 20%.  Neurology and cardiology were consulted and due to high risk of emboli decision was made to anticoagulate and put the patient in the ICU.  LV thrombus more than 3 cm in the size.  Subsequently, patient was transferred out of the ICU under [facility] care again on 03/07/2024.   Assessment and Plan:  Acute left MCA CVA - On presentation, CT head noting left MCA infarct with M2 occlusion.  MRI showed left basal ganglia and insula punctate focal ischemia.  LV/LA/RV thrombus and RLE DVT - Noted on echo, showing multiple clot attached to the septum along with small RV clot as well.  Was initiated on anticoagulation initially transition to ICU.  Since transitioned out of ICU.  Continues on anticoagulation.  Hypercoagulable workup noting mildly low Antithrombin activity  Acute hypoxic respiratory failure - Currently on 4 L nasal cannula.  Likely secondary to acute HFrEF exacerbation.  Diuretics on board.  Will work to wean O2 as tolerated.  Acute on chronic HFrEF - EF on presentation noting severely reduced function, global hypokinesis, EF less than 20%.  Currently on spironolactone, Coreg, losartan.  Cardiology following closely.  P.o. Lasix 40 mg daily.  Urine output excellent, minus nearly 2.5 L since yesterday.  CAD - Not a candidate for invasive workup given extensive substance abuse  appliance.  Hypertension/hyperlipidemia - Medication regimen above.  Diabetes mellitus -Insulin sliding scale on board.  Polysubstance abuse - Drug screen positive for cocaine, opiates, benzos.  Encourage cessation.  Goals of care - Evaluated by palliative care however seemed uninterested in anything they had to offer.  Disposition difficult given patient's substance abuse.  Unfortunately, with his poor EF and multiple comorbidities, overall prognosis is likely very poor.  Continue to work closely with TOC on disposition planning.  Hoping for SNF.      Subjective: Patient resting comfortably this morning.  Denies any worsening shortness of breath, chest pain, nausea, vomiting, abdominal pain.  Continues on 4 L nasal cannula.  Urine output excellent.  Physical Exam: Vitals:   03/12/24 0402 03/12/24 0758 03/12/24 0818 03/12/24 1243  BP: (!) 135/92  (!) 147/107 125/82  Pulse: 82 86 80 63  Resp:  (!) 21 18 18   Temp: (!) 97.4 F (36.3 C)  97.7 F (36.5 C) (!) 97.5 F (36.4 C)  TempSrc: Oral  Oral Oral  SpO2: 100% 99% 100% 100%  Weight:      Height:       GENERAL:  Alert, pleasant, no acute distress, disheveled HEENT:  EOMI CARDIOVASCULAR:  RRR, no murmurs appreciated RESPIRATORY: Poor air movement bilaterally GASTROINTESTINAL:  Soft, nontender, nondistended EXTREMITIES: BL BKA NEURO:  No new focal deficits appreciated SKIN:  No rashes noted PSYCH:  Appropriate mood and affect   Data Reviewed:  There are no new results to review at this time.  Family Communication: None at bedside  Disposition: Status is: Inpatient Remains inpatient appropriate because: Hypoxia,  LV thrombus, CVA  Planned Discharge Destination: Skilled nursing facility    Time spent: 36 minutes  Author: Jodeane Mulligan, DO 03/12/2024 2:15 PM  For on call review [email or web address].

## 2024-03-12 NOTE — Plan of Care (Signed)
  Problem: Education: Goal: Knowledge of disease or condition will improve Outcome: Progressing Goal: Knowledge of secondary prevention will improve (MUST DOCUMENT ALL) Outcome: Progressing Goal: Knowledge of patient specific risk factors will improve (DELETE if not current risk factor) Outcome: Progressing   Problem: Ischemic Stroke/TIA Tissue Perfusion: Goal: Complications of ischemic stroke/TIA will be minimized Outcome: Progressing   Problem: Coping: Goal: Will verbalize positive feelings about self Outcome: Progressing Goal: Will identify appropriate support needs Outcome: Progressing   Problem: Health Behavior/Discharge Planning: Goal: Ability to manage health-related needs will improve Outcome: Progressing Goal: Goals will be collaboratively established with patient/family Outcome: Progressing   Problem: Self-Care: Goal: Ability to participate in self-care as condition permits will improve Outcome: Progressing Goal: Verbalization of feelings and concerns over difficulty with self-care will improve Outcome: Progressing Goal: Ability to communicate needs accurately will improve Outcome: Progressing   Problem: Nutrition: Goal: Risk of aspiration will decrease Outcome: Progressing Goal: Dietary intake will improve Outcome: Progressing   Problem: Education: Goal: Knowledge of General Education information will improve Description: Including pain rating scale, medication(s)/side effects and non-pharmacologic comfort measures Outcome: Progressing   Problem: Health Behavior/Discharge Planning: Goal: Ability to manage health-related needs will improve Outcome: Progressing   Problem: Clinical Measurements: Goal: Ability to maintain clinical measurements within normal limits will improve Outcome: Progressing Goal: Will remain free from infection Outcome: Progressing Goal: Diagnostic test results will improve Outcome: Progressing Goal: Respiratory complications will  improve Outcome: Progressing Goal: Cardiovascular complication will be avoided Outcome: Progressing   Problem: Activity: Goal: Risk for activity intolerance will decrease Outcome: Progressing   Problem: Nutrition: Goal: Adequate nutrition will be maintained Outcome: Progressing   Problem: Coping: Goal: Level of anxiety will decrease Outcome: Progressing   Problem: Elimination: Goal: Will not experience complications related to bowel motility Outcome: Progressing Goal: Will not experience complications related to urinary retention Outcome: Progressing   Problem: Pain Managment: Goal: General experience of comfort will improve and/or be controlled Outcome: Progressing   Problem: Safety: Goal: Ability to remain free from injury will improve Outcome: Progressing   Problem: Skin Integrity: Goal: Risk for impaired skin integrity will decrease Outcome: Progressing

## 2024-03-12 NOTE — Consult Note (Signed)
 Palliative Care Consult Note                                  Date: 03/12/2024   Patient Name: [name]  DOB: [birth year]  MRN: [id number]  Age / Sex: 58 y.o., male  PCP: Abraham Abo, MD Referring Physician: Jodeane Mulligan, DO  Reason for Consultation: Establishing goals of care  HPI/Patient Profile: 58 y.o. male  with past medical history of Schizoaffective disorder, heart failure with reduced ejection fraction, hypertension, DMII, COPD, s/p bilateral amputations, chronic pain, and polysubstance abuse admitted on 03/04/2024 when his caregiver found him with slurred speech and right sided weakness. Head CT reveals acute left MCA territory infarct. 2D echocardiogram showed large mobile LV thrombus, LA thrombus and RV apical thrombus with EF of 20%.   Past Medical History:  Diagnosis Date   Asthma    Bipolar 1 disorder (HCC)    CHF (congestive heart failure) (HCC)    COPD (chronic obstructive pulmonary disease) (HCC)    Depression    Diabetes mellitus without complication (HCC)    GERD (gastroesophageal reflux disease)    Homelessness    Hypertension    QT prolongation    Schizoaffective disorder (HCC)     Subjective:   I have reviewed medical records including EPIC notes, labs and imaging, received an update from Dr. Macarthur Savory, assessed the patient and then met with the patient to discuss diagnosis prognosis, GOC, EOL wishes, disposition and options.  I introduced Palliative Medicine as specialized medical care for people living with serious illness. It focuses on providing relief from symptoms and stress of a serious illness. The goal is to improve quality of life for both the patient and the family.  Today's Discussion: Patient was initially interested in talking with palliative care because he thought we were involved in the discharge process. Patient is eager to get home. I shared that we can discuss his goals for getting  home and overall goals of care. He is not interested in discussing goals of care at this time.   I asked if he has ever considered a proxy decision maker in case he were unable to make decisions. He has not and is not interested in determining this at this time.  Discussed the importance of continued conversation with family and the medical providers regarding overall plan of care and treatment options, ensuring decisions are within the context of the patient's values and GOCs.  Review of Systems  Unable to perform ROS   Objective:   Primary Diagnoses: Present on Admission:  (Resolved) Tobacco use disorder  (Resolved) Schizophrenia (HCC)  Schizoaffective disorder, bipolar type (HCC)  Prolonged QT interval  Acute CVA (cerebrovascular accident) (HCC)  Acid reflux  COPD (chronic obstructive pulmonary disease) (HCC)  Cocaine abuse (HCC)   Physical Exam Cardiovascular:     Rate and Rhythm: Normal rate.  Pulmonary:     Effort: Pulmonary effort is normal.  Musculoskeletal:     Comments: Bilateral lower extremity amputations  Neurological:     Mental Status: He is alert and oriented to person, place, and time.     Cranial Nerves: Facial asymmetry present.     Comments: Expressive aphasia     Vital Signs:  BP 125/82 (BP Location: Left Arm)   Pulse 63   Temp (!) 97.5 F (36.4 C) (Oral)   Resp 18   Ht 5\' 9"  (1.753 m)   Wt  78.4 kg   SpO2 100%   BMI 25.52 kg/m     Advanced Care Planning:   Existing Vynca/ACP Documentation: None  Primary Decision Maker: PATIENT  Code Status/Advance Care Planning: Full code   Assessment & Plan:   SUMMARY OF RECOMMENDATIONS   Full code and full scope Patient is not interested in PMT services. PMT will sign off at this time. Please reconsult if patient needs arise and/or if patient is open to discussion.    Discussed with: Dr. Macarthur Savory  Time Total: 45 minutes    Thank you for allowing us  to participate in the care of Kurt Hoffmeier PMT will continue to support holistically.   Signed by: Joaquim Muir, NP Palliative Medicine Team  Team Phone # [phone] (Nights/Weekends)  03/12/2024, 2:35 PM

## 2024-03-13 DIAGNOSIS — I420 Dilated cardiomyopathy: Secondary | ICD-10-CM

## 2024-03-13 DIAGNOSIS — I5022 Chronic systolic (congestive) heart failure: Secondary | ICD-10-CM | POA: Diagnosis not present

## 2024-03-13 DIAGNOSIS — I5043 Acute on chronic combined systolic (congestive) and diastolic (congestive) heart failure: Secondary | ICD-10-CM | POA: Diagnosis not present

## 2024-03-13 DIAGNOSIS — I639 Cerebral infarction, unspecified: Secondary | ICD-10-CM | POA: Diagnosis not present

## 2024-03-13 DIAGNOSIS — I513 Intracardiac thrombosis, not elsewhere classified: Secondary | ICD-10-CM | POA: Diagnosis not present

## 2024-03-13 DIAGNOSIS — F1410 Cocaine abuse, uncomplicated: Secondary | ICD-10-CM | POA: Diagnosis not present

## 2024-03-13 LAB — GLUCOSE, CAPILLARY
Glucose-Capillary: 100 mg/dL — ABNORMAL HIGH (ref 70–99)
Glucose-Capillary: 156 mg/dL — ABNORMAL HIGH (ref 70–99)
Glucose-Capillary: 169 mg/dL — ABNORMAL HIGH (ref 70–99)
Glucose-Capillary: 86 mg/dL (ref 70–99)
Glucose-Capillary: 93 mg/dL (ref 70–99)

## 2024-03-13 NOTE — Plan of Care (Signed)
 progressing

## 2024-03-13 NOTE — Plan of Care (Signed)
  Problem: Education: Goal: Knowledge of disease or condition will improve Outcome: Progressing Goal: Knowledge of secondary prevention will improve (MUST DOCUMENT ALL) Outcome: Progressing Goal: Knowledge of patient specific risk factors will improve (DELETE if not current risk factor) Outcome: Progressing   Problem: Ischemic Stroke/TIA Tissue Perfusion: Goal: Complications of ischemic stroke/TIA will be minimized Outcome: Progressing   Problem: Coping: Goal: Will verbalize positive feelings about self Outcome: Progressing Goal: Will identify appropriate support needs Outcome: Progressing   Problem: Health Behavior/Discharge Planning: Goal: Ability to manage health-related needs will improve Outcome: Progressing Goal: Goals will be collaboratively established with patient/family Outcome: Progressing   Problem: Self-Care: Goal: Ability to participate in self-care as condition permits will improve Outcome: Progressing Goal: Verbalization of feelings and concerns over difficulty with self-care will improve Outcome: Progressing Goal: Ability to communicate needs accurately will improve Outcome: Progressing   Problem: Nutrition: Goal: Risk of aspiration will decrease Outcome: Progressing Goal: Dietary intake will improve Outcome: Progressing   Problem: Education: Goal: Knowledge of General Education information will improve Description: Including pain rating scale, medication(s)/side effects and non-pharmacologic comfort measures Outcome: Progressing   Problem: Health Behavior/Discharge Planning: Goal: Ability to manage health-related needs will improve Outcome: Progressing   Problem: Clinical Measurements: Goal: Ability to maintain clinical measurements within normal limits will improve Outcome: Progressing Goal: Will remain free from infection Outcome: Progressing Goal: Diagnostic test results will improve Outcome: Progressing Goal: Respiratory complications will  improve Outcome: Progressing Goal: Cardiovascular complication will be avoided Outcome: Progressing   Problem: Activity: Goal: Risk for activity intolerance will decrease Outcome: Progressing   Problem: Nutrition: Goal: Adequate nutrition will be maintained Outcome: Progressing   Problem: Coping: Goal: Level of anxiety will decrease Outcome: Progressing   Problem: Elimination: Goal: Will not experience complications related to bowel motility Outcome: Progressing Goal: Will not experience complications related to urinary retention Outcome: Progressing   Problem: Pain Managment: Goal: General experience of comfort will improve and/or be controlled Outcome: Progressing   Problem: Safety: Goal: Ability to remain free from injury will improve Outcome: Progressing   Problem: Skin Integrity: Goal: Risk for impaired skin integrity will decrease Outcome: Progressing

## 2024-03-13 NOTE — Progress Notes (Signed)
 Rounding Note    Patient Name: [name] Date of Encounter: 03/13/2024   [facility] Cardiologist: Rollene Rotunda, MD   Subjective   58 year old gentleman with moderate coronary artery disease, acute on chronic HFrEF, CVA, hypertension, diabetes mellitus, COPD, schizoaffective disorder, polysubstance abuse including cocaine, noncompliance.  He was admitted with a large stroke.  He was found to have LV thrombus as well as RV thrombus.  Echocardiogram shows LVEF severely reduced with an EF of less than 20%.  He has large mobile clots in the left ventricle and left atrium as well as a small RV apical clot.  He has been started on Eliquis.  Hypercoagulable workup is underway.  He is diuresed 3.2 L so far during this hospitalization.  His weight today is 75 kg.  Inpatient Medications    Scheduled Meds:  apixaban  5 mg Oral BID   arformoterol  15 mcg Nebulization BID   ARIPiprazole  5 mg Oral Daily   budesonide (PULMICORT) nebulizer solution  0.5 mg Nebulization BID   carvedilol  6.25 mg Oral BID WC   feeding supplement  237 mL Oral BID BM   furosemide  40 mg Oral Daily   losartan  50 mg Oral Daily   nicotine  14 mg Transdermal Daily   pantoprazole  40 mg Oral Daily   revefenacin  175 mcg Nebulization Daily   rosuvastatin  5 mg Oral QHS   spironolactone  12.5 mg Oral Daily   Continuous Infusions:  PRN Meds: albuterol, oxyCODONE   Vital Signs    Vitals:   03/13/24 0408 03/13/24 0500 03/13/24 0809 03/13/24 0854  BP: (!) 150/118  (!) 139/95   Pulse: 71  72   Resp: 18     Temp: 98.5 F (36.9 C)  97.7 F (36.5 C)   TempSrc:   Oral   SpO2: 100%  100% 99%  Weight:  75 kg    Height:        Intake/Output Summary (Last 24 hours) at 03/13/2024 1056 Last data filed at 03/13/2024 0408 Gross per 24 hour  Intake 120 ml  Output 300 ml  Net -180 ml      03/13/2024    5:00 AM 03/12/2024    7:01 AM 03/11/2024    5:00 AM  Last 3 Weights  Weight (lbs) 165 lb 5.5  oz 166 lb 0.1 oz 172 lb 13.5 oz  Weight (kg) 75 kg 75.3 kg 78.4 kg      Telemetry     - Personally Reviewed  ECG     - Personally Reviewed  Physical Exam   GEN: No acute distress.   Neck: No JVD Cardiac: RRR, no murmurs, rubs, or gallops.  Respiratory: Clear to auscultation bilaterally. GI: Soft, nontender, non-distended  MS:  bilateral amputee  Neuro:  Nonfocal  Psych: s/p stroke, speech is difficult to understand  Labs    High Sensitivity Troponin:  No results for input(s): "TROPONINIHS" in the last 720 hours.   Chemistry Recent Labs  Lab 03/07/24 1009 03/09/24 1045 03/10/24 0601  NA 140  --  138  K 4.0  --  4.6  CL 109  --  102  CO2 22  --  23  GLUCOSE 100*  --  108*  BUN 15  --  14  CREATININE 0.99  --  1.05  CALCIUM 8.6*  --  9.1  MG  --  1.7 2.0  PROT  --   --  7.0  ALBUMIN  --   --  3.3*  AST  --   --  17  ALT  --   --  30  ALKPHOS  --   --  63  BILITOT  --   --  1.6*  GFRNONAA >60  --  >60  ANIONGAP 9  --  13    Lipids No results for input(s): "CHOL", "TRIG", "HDL", "LABVLDL", "LDLCALC", "CHOLHDL" in the last 168 hours.  Hematology Recent Labs  Lab 03/08/24 0533 03/09/24 0550 03/10/24 0601  WBC 5.6 5.5 6.4  RBC 4.13* 4.34 4.79  HGB 12.9* 13.6 14.9  HCT 38.3* 40.7 45.1  MCV 92.7 93.8 94.2  MCH 31.2 31.3 31.1  MCHC 33.7 33.4 33.0  RDW 16.6* 16.7* 16.6*  PLT 187 189 223   Thyroid  Recent Labs  Lab 03/10/24 0601  TSH 6.039*    BNP Recent Labs  Lab 03/10/24 0601  BNP 1,475.2*    DDimer No results for input(s): "DDIMER" in the last 168 hours.   Radiology    No results found.  Cardiac Studies     Patient Profile     58 y.o. male   Assessment & Plan     Acute CVA with LV, LA, RV thrombi, DVT .  He is on eliquis  Continue current meds   2.  Chronic HFrEF :   difficult to treat given compliance issues   Continue coreg, lasix losartan , spiro His prognosis is poor .  Palliative care has been consulted.     3.  CAD :    he is not a candidate for invasive cardiac procedure          For questions or updates, please contact [facility] Please consult [email or web address] for contact info under        Signed, Ahmad Alert, MD  03/13/2024, 10:56 AM

## 2024-03-13 NOTE — Progress Notes (Addendum)
 Progress Note   Patient: [name] WUJ:[id number] DOB: [birth year] DOA: 03/04/2024     9 DOS: the patient was seen and examined on 03/13/2024   Brief hospital course: 58 year old male with past medical history of cardiomyopathy, active cocaine abuse presented to the hospital with slurred speech and confusion with right sided weakness.  Code stroke was activated and was brought into the hospital.  CT head showed left MCA infarct, CTA head and neck showed M2 occlusion on the left.  2D echocardiogram showed large mobile LV thrombus, LA thrombus and RV apical thrombus with EF of 20%.  Neurology and cardiology were consulted and due to high risk of emboli decision was made to anticoagulate and put the patient in the ICU.  LV thrombus more than 3 cm in the size.  Subsequently, patient was transferred out of the ICU under [facility] care again on 03/07/2024.   Assessment and Plan:  Acute left MCA CVA - On presentation, CT head noting left MCA infarct with M2 occlusion.  MRI showed left basal ganglia and insula punctate focal ischemia.  Still has some residual facial droop.  SNF placement difficult per TOC.  Palliative care consulted as well however not very receptive to their discussion.  LV/LA/RV thrombus and RLE DVT - Noted on echo, showing multiple clot attached to the septum along with small RV clot as well.  Was initiated on anticoagulation initially transition to ICU.  Since transitioned out of ICU.  Continues on anticoagulation.  Hypercoagulable workup noting mildly low Antithrombin activity, low protein C activity.  Unclear how reliable these indicators are given acute setting.  They need continued workup in the outpatient setting.  Acute on chronic hypoxic respiratory failure - Currently on 4 L nasal cannula.  Likely secondary to acute HFrEF exacerbation.  Diuretics on board.  Will work to wean O2 as tolerated.  Patient states he has O2 at home as well as concentrator.    Acute on chronic HFrEF - EF  on presentation noting severely reduced function, global hypokinesis, EF less than 20%.  Currently on spironolactone, Coreg, losartan.  Cardiology following closely.  P.o. Lasix 40 mg daily.  Urine output excellent.  CAD - Not a candidate for invasive workup given extensive substance abuse appliance.  Hypertension/hyperlipidemia - Medication regimen above.  Diabetes mellitus -Insulin sliding scale on board.  Polysubstance abuse - Drug screen positive for cocaine, opiates, benzos.  Encourage cessation.  Goals of care - Evaluated by palliative care however seemed uninterested in anything they had to offer.  Disposition difficult given patient's substance abuse.  Unfortunately, with his poor EF and multiple comorbidities, overall prognosis is likely very poor.  Continue to work closely with TOC on disposition planning.  Hoping for SNF.  Patient himself however appears to be very motivated to be discharged home as soon as possible.  Does not seem concerned with his multiple diagnoses, states he "feels fine".      Subjective: Patient resting comfortably this morning.  Denies any worsening shortness of breath, chest pain, nausea, vomiting, abdominal pain.  Continues on 4 L nasal cannula.  Urine output excellent.  Appears very motivated to be discharged home.  Seems to have little concern for his diagnosis/prognosis.  Physical Exam: Vitals:   03/13/24 0500 03/13/24 0809 03/13/24 0854 03/13/24 1208  BP:  (!) 139/95  125/87  Pulse:  72  63  Resp:      Temp:  97.7 F (36.5 C)  97.7 F (36.5 C)  TempSrc:  Oral  Oral  SpO2:  100% 99% 97%  Weight: 75 kg     Height:       GENERAL:  Alert, pleasant, no acute distress, disheveled HEENT:  EOMI CARDIOVASCULAR:  RRR, no murmurs appreciated RESPIRATORY: Poor air movement bilaterally GASTROINTESTINAL:  Soft, nontender, nondistended EXTREMITIES: BL AKA NEURO:  No new focal deficits appreciated SKIN:  No rashes noted PSYCH: Agitated  Data  Reviewed:  There are no new results to review at this time.  Family Communication: None at bedside  Disposition: Status is: Inpatient Remains inpatient appropriate because: Hypoxia, LV thrombus, CVA  Planned Discharge Destination: Skilled nursing facility    Time spent: 35 minutes  Author: Jodeane Mulligan, DO 03/13/2024 1:29 PM  For on call review [email or web address].

## 2024-03-14 DIAGNOSIS — I639 Cerebral infarction, unspecified: Secondary | ICD-10-CM | POA: Diagnosis not present

## 2024-03-14 LAB — CBC
HCT: 43.9 % (ref 39.0–52.0)
Hemoglobin: 14.5 g/dL (ref 13.0–17.0)
MCH: 30.5 pg (ref 26.0–34.0)
MCHC: 33 g/dL (ref 30.0–36.0)
MCV: 92.2 fL (ref 80.0–100.0)
Platelets: 281 10*3/uL (ref 150–400)
RBC: 4.76 MIL/uL (ref 4.22–5.81)
RDW: 15.9 % — ABNORMAL HIGH (ref 11.5–15.5)
WBC: 7.7 10*3/uL (ref 4.0–10.5)
nRBC: 0 % (ref 0.0–0.2)

## 2024-03-14 LAB — GLUCOSE, CAPILLARY
Glucose-Capillary: 112 mg/dL — ABNORMAL HIGH (ref 70–99)
Glucose-Capillary: 124 mg/dL — ABNORMAL HIGH (ref 70–99)
Glucose-Capillary: 123 mg/dL — ABNORMAL HIGH (ref 70–99)

## 2024-03-14 LAB — BASIC METABOLIC PANEL WITH GFR
Anion gap: 10 (ref 5–15)
BUN: 19 mg/dL (ref 6–20)
CO2: 26 mmol/L (ref 22–32)
Calcium: 9.1 mg/dL (ref 8.9–10.3)
Chloride: 102 mmol/L (ref 98–111)
Creatinine, Ser: 0.95 mg/dL (ref 0.61–1.24)
GFR, Estimated: >60 mL/min (ref >60)
Glucose, Bld: 103 mg/dL — ABNORMAL HIGH (ref 70–99)
Potassium: 4.1 mmol/L (ref 3.5–5.1)
Sodium: 138 mmol/L (ref 135–145)

## 2024-03-14 LAB — MAGNESIUM: Magnesium: 1.9 mg/dL (ref 1.7–2.4)

## 2024-03-14 MED ORDER — SPIRONOLACTONE 25 MG PO TABS: 12.5000 mg | ORAL_TABLET | Freq: Every day | ORAL | 0 refills | Status: DC

## 2024-03-14 MED ORDER — ROSUVASTATIN CALCIUM 5 MG PO TABS: 5.0000 mg | ORAL_TABLET | Freq: Every day | ORAL | 0 refills | Status: AC

## 2024-03-14 MED ORDER — LORAZEPAM 2 MG/ML IJ SOLN
0.2500 mg | Freq: Once | INTRAMUSCULAR | Status: AC
  Administered 2024-03-14: 0.25 mg via INTRAVENOUS
  Filled 2024-03-14: qty 1

## 2024-03-14 MED ORDER — APIXABAN 5 MG PO TABS: 5.0000 mg | ORAL_TABLET | Freq: Two times a day (BID) | ORAL | 0 refills | Status: DC

## 2024-03-14 NOTE — Progress Notes (Signed)
 Cardiology f/u arranged 5/8 and outlined on AVS along with location notation for new office.

## 2024-03-14 NOTE — Progress Notes (Signed)
 Patient is agitated and irritable. Trying to get off the bed. Pulled out bed pads and pillows thrown at the floor. Informed Dr. Hildreth Lower about the situation.

## 2024-03-14 NOTE — Progress Notes (Signed)
 Patient refusing CBG monitoring, vitals, and supplemental oxygen despite several staff member attempts to encourage compliance. Patient verbally aggressive on and off towards staff.  RN continuing to encourage compliance.

## 2024-03-14 NOTE — Progress Notes (Signed)
   03/14/24 1329  What Happened  Was fall witnessed? No  Was patient injured? No  Patient found on floor  Found by Staff-comment (chair alarm activated)  Stated prior activity other (comment) (in chair, patient tried to stand with bilat. BKAs)  Provider Notification  Provider Name/Title Earlean Glaze, MD  Date Provider Notified 03/14/24  Time Provider Notified 1325  Method of Notification Page  Notification Reason Fall  Follow Up  Family notified No - patient refusal  Adult Fall Risk Assessment  Risk Factor Category (scoring not indicated) High fall risk per protocol (document High fall risk)  Age 58  Fall History: Fall within 6 months prior to admission 0  Elimination; Bowel and/or Urine Incontinence 2  Elimination; Bowel and/or Urine Urgency/Frequency 0  Medications: includes PCA/Opiates, Anti-convulsants, Anti-hypertensives, Diuretics, Hypnotics, Laxatives, Sedatives, and Psychotropics 5  Patient Care Equipment 2  Mobility-Assistance 2  Mobility-Gait 2  Mobility-Sensory Deficit 2  Altered awareness of immediate physical environment 0  Impulsiveness 2  Lack of understanding of one's physical/cognitive limitations 4  Total Score 21  Patient Fall Risk Level High fall risk  Adult Fall Risk Interventions  Required Bundle Interventions *See Row Information* High fall risk - low, moderate, and high requirements implemented  Additional Interventions Use of appropriate toileting equipment (bedpan, BSC, etc.)  Fall intervention(s) refused/Patient educated regarding refusal Bed alarm (chair alarm)  Screening for Fall Injury Risk (To be completed on HIGH fall risk patients) - Assessing Need for Floor Mats  Risk For Fall Injury- Criteria for Floor Mats Confusion/dementia (+NuDESC, CIWA, TBI, etc.)  Will Implement Floor Mats Yes  Vitals  BP  (pt. refused post fall vitals)  Pain Assessment  Pain Scale 0-10  Pain Score 0  Neurological  Neuro (WDL)  (pt. refusing neuro assessment)    Betsey Brow, MD paged.  Management notified

## 2024-03-14 NOTE — Progress Notes (Addendum)
 Progress Note  Patient Name: [name] Date of Encounter: 03/14/2024  Primary Cardiologist: Rollene Rotunda, MD  Subjective   Per overnight nursing note, patient refused several assessments, vitals, CBG, and O2 overnight, also showing verbal agression and off towards staff. Denies any CP, SOB. Asking about chronic pain medicine. Day nurse indicates that when he had gone into patient's room earlier the patient cussed at him and wouldn't let him do anything.   Inpatient Medications    Scheduled Meds:  apixaban  5 mg Oral BID   arformoterol  15 mcg Nebulization BID   ARIPiprazole  5 mg Oral Daily   budesonide (PULMICORT) nebulizer solution  0.5 mg Nebulization BID   carvedilol  6.25 mg Oral BID WC   feeding supplement  237 mL Oral BID BM   furosemide  40 mg Oral Daily   losartan  50 mg Oral Daily   nicotine  14 mg Transdermal Daily   pantoprazole  40 mg Oral Daily   revefenacin  175 mcg Nebulization Daily   rosuvastatin  5 mg Oral QHS   spironolactone  12.5 mg Oral Daily   Continuous Infusions:  PRN Meds: albuterol, oxyCODONE   Vital Signs    Vitals:   03/13/24 2034 03/14/24 0500 03/14/24 0636 03/14/24 0804  BP: 135/72   (!) 145/99  Pulse:    76  Resp: 20  14 20   Temp: 97.9 F (36.6 C)   98.2 F (36.8 C)  TempSrc: Axillary   Oral  SpO2: 98%  99% 100%  Weight:  76.3 kg    Height:        Intake/Output Summary (Last 24 hours) at 03/14/2024 0914 Last data filed at 03/14/2024 0400 Gross per 24 hour  Intake 120 ml  Output 1450 ml  Net -1330 ml      03/14/2024    5:00 AM 03/13/2024    5:00 AM 03/12/2024    7:01 AM  Last 3 Weights  Weight (lbs) 168 lb 3.4 oz 165 lb 5.5 oz 166 lb 0.1 oz  Weight (kg) 76.3 kg 75 kg 75.3 kg     Telemetry    N/a - Personally Reviewed  Physical Exam   GEN: No acute distress. Chronically ill appearing HEENT: Normocephalic, atraumatic, sclera non-icteric. Neck: No JVD or bruits. Cardiac: RRR no murmurs, rubs, or gallops.   Respiratory: Clear to auscultation bilaterally. Breathing is unlabored. GI: Soft, nontender, non-distended, BS +x 4. MS: s/p bilateral BKA Extremities: s/p bilateral BKA no acute edema Neuro:  Expressive aphasia noted, slow cadence of speech, right sided weakness Psych: Calm affect.  Labs    High Sensitivity Troponin:  No results for input(s): "TROPONINIHS" in the last 720 hours.    Cardiac EnzymesNo results for input(s): "TROPONINI" in the last 168 hours. No results for input(s): "TROPIPOC" in the last 168 hours.   Chemistry Recent Labs  Lab 03/07/24 1009 03/10/24 0601  NA 140 138  K 4.0 4.6  CL 109 102  CO2 22 23  GLUCOSE 100* 108*  BUN 15 14  CREATININE 0.99 1.05  CALCIUM 8.6* 9.1  PROT  --  7.0  ALBUMIN  --  3.3*  AST  --  17  ALT  --  30  ALKPHOS  --  63  BILITOT  --  1.6*  GFRNONAA >60 >60  ANIONGAP 9 13     Hematology Recent Labs  Lab 03/08/24 0533 03/09/24 0550 03/10/24 0601  WBC 5.6 5.5 6.4  RBC 4.13* 4.34 4.79  HGB  12.9* 13.6 14.9  HCT 38.3* 40.7 45.1  MCV 92.7 93.8 94.2  MCH 31.2 31.3 31.1  MCHC 33.7 33.4 33.0  RDW 16.6* 16.7* 16.6*  PLT 187 189 223    BNP Recent Labs  Lab 03/10/24 0601  BNP 1,475.2*     DDimer No results for input(s): "DDIMER" in the last 168 hours.   Radiology    No results found.  Cardiac Studies     2d echo 03/04/24   1. The apex has prominen trabecualtions. There is a large mobile clot in  the LV that appears to be attached to the interventricular septum just  under the mitral valve. . Left ventricular ejection fraction, by  estimation, is <20%. The left ventricle has  severely decreased function. The left ventricle demonstrates global  hypokinesis. Left ventricular diastolic function could not be evaluated.   2. There is a small RV apical clot. Right ventricular systolic function  is severely reduced. The right ventricular size is normal.   3. There is a largr mobile clot in the LA. Left atrial size was  mildly  dilated.   4. Right atrial size was mildly dilated.   5. The mitral valve is normal in structure. Trivial mitral valve  regurgitation. No evidence of mitral stenosis.   6. The aortic valve is normal in structure. Aortic valve regurgitation is  not visualized. No aortic stenosis is present.   Conclusion(s)/Recommendation(s): Severe biventricular dysfunction with  large mobile clots in the LA, RV and LV as described above.       Patient Profile      58 y.o. male with CAD (moderate by cath 2020, moderate-severe by coronary CTA in 03/2020 and patient left AMA before further workup completed), chronic HFrEF, CVA 12/2020, HTN, DM-2, COPD/asthma, schizoaffective disorder, polysubstance abuse, non compliance, prolonged QT, depression, bilateral LE amputee, tobacco abuse, chronic pain, polysubstance abuse including cocaine use presented with stroke and LV thrombus. Not a candidate for IR since LKW was 26 hours prior to admission. 2D echo showed EF <20% with large mobile LV clots in the LV and LA as well as small RV apical clot. Duplex also c/w RLE DVT. Hypercoag panel under way.   He has been intermittently refusing assessments/interventions. Given concern for prognosis, palliative care saw patient 4/12 and he was not interested in discussing goals of care further.  Assessment & Plan    1. Acute CVA found to have LV, LA, RA clots with RLE DVT as well - previously on parenteral anticoagulation, transitioned to Eliquis per neurology team (warfarin felt poor option given compliance concerns) - hypercoag workup ordered this admission, will defer to primary teams for interpretation   2. Chronic HFrEF - volume status looks OK - current GDMT: losartan 50mg  daily, carvedilol 6.25mg  BID, Lasix 40mg  daily, spironolactone 12.5mg  daily - patient advised to abstain from cocaine use with BB - f/u labs pending if patient agrees to have drawn, but noncompliance with care plan has been a problem   3.  Moderate-severe CAD by cor CT 2021 - not a candidate for invasive evaluation given substance abuse and compliance issues - denies CP - no ASA with concomitant Eliquis - Dr. Rennis Golden had recommended starting rosuvastatin 5mg  daily this admission   4. HTN - manage in context above   5. Polysubstance abuse - active ongoing cocaine use with high risk for morbidity/mortality with going use   6. Prolonged QT interval, NSVT, occasional PVCs - avoid QT prolonging medications - no further NSVT on telemetry -  keep K 4.0 or greater, Mg 2.0 or greater   Remainder per primary Do not suspect cardiology needs to continue acutely following, possibly s/o, will review with MD  For questions or updates, please contact [facility] Please consult [email or web address] for contact info under Cardiology/STEMI.  Signed, Alexandria Angel, PA-C 03/14/2024, 9:14 AM     History and all data above reviewed.  Patient examined.  I agree with the findings as above.   Angry, not answering questions. No apparent acute distress The patient exam reveals COR:RRR  ,  Lungs: Clear  ,  Abd: Positive bowel sounds, no rebound no guarding, Ext Status post bilateral lower extremity amputation  .  All available labs, radiology testing, previous records reviewed. Agree with documented assessment and plan. CVA with LV thrombus:  Home with meds as above.  Outcome with be poor given his comorbid issues.  We will arrange follow up.  Cards will sign off.   Royston Cornea Jackelyn Illingworth  11:04 AM  03/14/2024

## 2024-03-14 NOTE — Progress Notes (Signed)
 Patient says he knows when he needs to urinate and have a bowel movement but urinated on the floor and tried to get up without help.  Pt. Found sitting on floor, denies pain.  Refuses to answer all other questions.  Just yells to be left alone.

## 2024-03-14 NOTE — Progress Notes (Signed)
 PROGRESS NOTE  [name]  NWG:[id number] DOB: [birth year] DOA: 03/04/2024 PCP: Georganna Skeans, MD  Consultants  Brief Narrative: 58 year old male with past medical history of cardiomyopathy, active cocaine abuse presented to the hospital with slurred speech and confusion with right sided weakness.  Code stroke was activated and was brought into the hospital.  CT head showed left MCA infarct, CTA head and neck showed M2 occlusion on the left.  2D echocardiogram showed large mobile LV thrombus, LA thrombus and RV apical thrombus with EF of 20%.  Neurology and cardiology were consulted and due to high risk of emboli decision was made to anticoagulate and put the patient in the ICU.  LV thrombus more than 3 cm in the size.  Subsequently, patient was transferred out of the ICU under [facility] care again on 03/07/2024.    Assessment and Plan:   Acute left MCA CVA - On presentation, CT head noting left MCA infarct with M2 occlusion.  MRI showed left basal ganglia and insula punctate focal ischemia.  Still has some residual facial droop.  SNF placement difficult per TOC.  Palliative care consulted as well however not receptive to their discussion. - Has residual right sided weakness.  Agitation: - Patient has become increasingly agitated and verbally abusive throughout this hospitalization over the past 2 days or so. - Essentially refusing all cares including vital signs, lab draws, taking his medications.  Started yesterday but escalated this morning. -Verbally abusive to staff.  Cursing at cardiology as well as myself this morning. - Reports he "just wants to go home."  When I brought up question about SNF he became very angry and began insulting and cursing me. -He has evidently slid out of bed twice today. - I do not think he is able to fully understand his condition.  He knows his name and that he is in the hospital but not why he is here nor what could happen to him when he goes home. -Psych consult  placed for help with ongoing agitation as well as capacity evaluation.   LV/LA/RV thrombus and RLE DVT - Noted on echo, showing multiple clot attached to the septum along with small RV clot as well.  Was initiated on anticoagulation initially transition to ICU.  Since transitioned out of ICU.  Continues on anticoagulation.  Hypercoagulable workup noting mildly low Antithrombin activity, low protein C activity.  - Cardiology following until today.  They have signed off.  Appreciate input. -Continue Eliquis   Acute on chronic hypoxic respiratory failure - Currently on 4 L nasal cannula.  Likely secondary to acute HFrEF exacerbation.  Diuretics on board.  Will work to wean O2 as tolerated.  Patient states he has O2 at home as well as concentrator.     Acute on chronic HFrEF - EF on presentation noting severely reduced function, global hypokinesis, EF less than 20%.  Currently on spironolactone, Coreg, losartan.   P.o. Lasix 40 mg daily.  Urine output excellent.   CAD - Not a candidate for invasive workup given extensive substance abuse appliance.   Hypertension/hyperlipidemia - Medication regimen above.   Diabetes mellitus -Insulin sliding scale on board.   Polysubstance abuse - Drug screen positive for cocaine, opiates, benzos.  Encourage cessation.   Goals of care - Evaluated by palliative care however seemed uninterested in anything they had to offer.  Disposition difficult given patient's substance abuse.  Unfortunately, with his poor EF and multiple comorbidities, overall prognosis is likely very poor.   -Initial plan today  was discharged home because of how adamant he is about leaving the hospital.  However on further evaluation I am not sure about his overall capacity to make medical decisions for himself after his recent stroke..   - Does not seem concerned with his multiple diagnoses, states he "feels fine".  -Have therefore placed psych consult as above.        DVT  prophylaxis:   apixaban (ELIQUIS) tablet 5 mg  Code Status:   Code Status: Full Code Level of care: Telemetry Medical Status is: Inpatient   Consults called: Cardiology  Subjective: Angry today.  Not very interactive.  Verbally abusive towards staff, cardiology team, myself.  Yells that he wants to go home.  Objective: Vitals:   03/14/24 0636 03/14/24 0804 03/14/24 1544 03/14/24 1546  BP:  (!) 145/99 (!) 140/104 (!) 152/83  Pulse:  76 66 66  Resp: 14 20 (!) 24 (!) 24  Temp:  98.2 F (36.8 C)  99.7 F (37.6 C)  TempSrc:  Oral  Oral  SpO2: 99% 100% 95% 95%  Weight:      Height:        Intake/Output Summary (Last 24 hours) at 03/14/2024 1656 Last data filed at 03/14/2024 0900 Gross per 24 hour  Intake 440 ml  Output 1700 ml  Net -1260 ml   Filed Weights   03/12/24 0701 03/13/24 0500 03/14/24 0500  Weight: 75.3 kg 75 kg 76.3 kg   Body mass index is 24.84 kg/m.  Gen: 58 y.o. male in no apparent distress.  Nontoxic Pulm: Non-labored breathing.  Clear to auscultation bilaterally.  CV: Regular rate and rhythm. No murmur, rub, or gallop. No JVD Ext: Warm, no deformities Skin: No rashes, lesions Neuro: Alert and oriented.  Right upper extremity weakness appreciated.  Garbled speech that is somewhat difficult to understand. Psych: Agitated.   I have personally reviewed the following labs and images: CBC: Recent Labs  Lab 03/08/24 0533 03/09/24 0550 03/10/24 0601 03/14/24 0912  WBC 5.6 5.5 6.4 7.7  HGB 12.9* 13.6 14.9 14.5  HCT 38.3* 40.7 45.1 43.9  MCV 92.7 93.8 94.2 92.2  PLT 187 189 223 281   BMP &GFR Recent Labs  Lab 03/09/24 1045 03/10/24 0601 03/14/24 0912  NA  --  138 138  K  --  4.6 4.1  CL  --  102 102  CO2  --  23 26  GLUCOSE  --  108* 103*  BUN  --  14 19  CREATININE  --  1.05 0.95  CALCIUM  --  9.1 9.1  MG 1.7 2.0 1.9   Estimated Creatinine Clearance: 87.9 mL/min (by C-G formula based on SCr of 0.95 mg/dL). Liver & Pancreas: Recent Labs   Lab 03/10/24 0601  AST 17  ALT 30  ALKPHOS 63  BILITOT 1.6*  PROT 7.0  ALBUMIN 3.3*   No results for input(s): "LIPASE", "AMYLASE" in the last 168 hours. No results for input(s): "AMMONIA" in the last 168 hours. Diabetic: No results for input(s): "HGBA1C" in the last 72 hours. Recent Labs  Lab 03/13/24 0813 03/13/24 1209 03/13/24 1646 03/14/24 0804 03/14/24 1147  GLUCAP 86 100* 169* 112* 124*   Cardiac Enzymes: No results for input(s): "CKTOTAL", "CKMB", "CKMBINDEX", "TROPONINI" in the last 168 hours. No results for input(s): "PROBNP" in the last 8760 hours. Coagulation Profile: No results for input(s): "INR", "PROTIME" in the last 168 hours. Thyroid Function Tests: No results for input(s): "TSH", "T4TOTAL", "FREET4", "T3FREE", "THYROIDAB" in the last 72  hours. Lipid Profile: No results for input(s): "CHOL", "HDL", "LDLCALC", "TRIG", "CHOLHDL", "LDLDIRECT" in the last 72 hours. Anemia Panel: No results for input(s): "VITAMINB12", "FOLATE", "FERRITIN", "TIBC", "IRON", "RETICCTPCT" in the last 72 hours. Urine analysis:    Component Value Date/Time   COLORURINE AMBER (A) 03/05/2024 1122   APPEARANCEUR HAZY (A) 03/05/2024 1122   LABSPEC >1.046 (H) 03/05/2024 1122   PHURINE 5.0 03/05/2024 1122   GLUCOSEU NEGATIVE 03/05/2024 1122   HGBUR NEGATIVE 03/05/2024 1122   BILIRUBINUR NEGATIVE 03/05/2024 1122   KETONESUR 5 (A) 03/05/2024 1122   PROTEINUR 30 (A) 03/05/2024 1122   NITRITE NEGATIVE 03/05/2024 1122   LEUKOCYTESUR NEGATIVE 03/05/2024 1122   Sepsis Labs: Invalid input(s): "PROCALCITONIN", "LACTICIDVEN"  Microbiology: Recent Results (from the past 240 hours)  MRSA Next Gen by PCR, Nasal     Status: None   Collection Time: 03/04/24 11:10 PM   Specimen: Nasal Mucosa; Nasal Swab  Result Value Ref Range Status   MRSA by PCR Next Gen NOT DETECTED NOT DETECTED Final    Comment: (NOTE) The GeneXpert MRSA Assay (FDA approved for NASAL specimens only), is one component  of a comprehensive MRSA colonization surveillance program. It is not intended to diagnose MRSA infection nor to guide or monitor treatment for MRSA infections. Test performance is not FDA approved in patients less than 48 years old. Performed at [facility], [address] [place], [name] [address]     Radiology Studies: No results found.  Scheduled Meds:  apixaban  5 mg Oral BID   arformoterol  15 mcg Nebulization BID   ARIPiprazole  5 mg Oral Daily   budesonide (PULMICORT) nebulizer solution  0.5 mg Nebulization BID   carvedilol  6.25 mg Oral BID WC   feeding supplement  237 mL Oral BID BM   furosemide  40 mg Oral Daily   losartan  50 mg Oral Daily   nicotine  14 mg Transdermal Daily   pantoprazole  40 mg Oral Daily   revefenacin  175 mcg Nebulization Daily   rosuvastatin  5 mg Oral QHS   spironolactone  12.5 mg Oral Daily   Continuous Infusions:   LOS: 10 days   35 minutes with more than 50% spent in reviewing records, counseling patient/family and coordinating care.  Trenton Frock, MD [facility] [email or web address] 03/14/2024, 4:56 PM

## 2024-03-14 NOTE — Progress Notes (Signed)
   03/14/24 1546  What Happened  Was fall witnessed? No  Was patient injured? No  Patient found on floor  Found by Staff-comment (bed alarm)  Stated prior activity other (comment) (in bed, patient tried to stand with bilat. BKAs)  Provider Notification  Provider Name/Title Earlean Glaze, MD  Date Provider Notified 03/14/24  Time Provider Notified 1553  Method of Notification Page  Notification Reason Fall  Provider response No new orders  Date of Provider Response 03/14/24  Time of Provider Response 1555  Follow Up  Family notified No - patient refusal  Vitals  Temp 99.7 F (37.6 C)  Temp Source Oral  BP (!) 152/83  MAP (mmHg) 104  BP Location Left Arm  BP Method Automatic  Patient Position (if appropriate) Sitting  Pulse Rate 66  Pulse Rate Source Monitor  Resp (!) 24  Oxygen Therapy  SpO2 95 %  O2 Device Room Air  Pain Assessment  Pain Scale 0-10  Pain Score 0  Neurological  Neuro (WDL) X (pt. refuses assessment)  Level of Consciousness Alert  Orientation Level Oriented to person;Disoriented to time;Disoriented to situation;Oriented to place  Cognition Follows commands  Speech Slurred/Dysarthria   MD and Floor management aware

## 2024-03-14 NOTE — Progress Notes (Signed)
 Patient has now refused vitals twice.

## 2024-03-14 NOTE — Plan of Care (Signed)
  Problem: Education: Goal: Knowledge of disease or condition will improve Outcome: Not Progressing Goal: Knowledge of secondary prevention will improve (MUST DOCUMENT ALL) Outcome: Not Progressing Goal: Knowledge of patient specific risk factors will improve (DELETE if not current risk factor) Outcome: Not Progressing  Pt. Not responding to teaching, agitated and non compliant   Problem: Ischemic Stroke/TIA Tissue Perfusion: Goal: Complications of ischemic stroke/TIA will be minimized Outcome: Progressing

## 2024-03-14 NOTE — Progress Notes (Signed)
 PT Cancellation Note  Patient Details Name: [name] MRN: [id number] DOB: [birth year]   Cancelled Treatment:    Reason Eval/Treat Not Completed: Patient declined, no reason specified (pt fixated on using bathroom and despite being set up with condom cath by RN pt not initiating void and not agreeable to work with PT. Will follow up at later date/time as pt able and schedule allowed.)   Corbin Dess PT, DPT Acute Rehabilitation Services Office [phone]  03/14/24 10:31 AM

## 2024-03-14 NOTE — Discharge Summary (Incomplete)
 Physician Discharge Summary   Patient: [name] MRN: [id number] DOB: [birth year]  Admit date:     03/04/2024  Discharge date: {dischdate:26783}  Discharge Physician: Tobey Grim   PCP: Georganna Skeans, MD   Recommendations at discharge:  {Tip this will not be part of the note when signed- Example include specific recommendations for outpatient follow-up, pending tests to follow-up on. (Optional):26781}  Make sure to take you Eliquis (blood thinner medicine) once in the a.m. and once in the p.m.  This is for the blood clot they found.  Not taking this medicine will put you at a very high risk of repeat stroke.  Make sure to follow-up with your cardiologist in the next 2 weeks.  Make sure to follow-up with your PCP later this week to ensure you are still doing well.  If you start having any trouble with weakness, trouble breathing, chest pain, confusion or dizziness come back to the emergency room immediately  Discharge Diagnoses: Principal Problem:   Acute CVA (cerebrovascular accident) (HCC) Active Problems:   LV (left ventricular) mural thrombus   Chronic systolic CHF (congestive heart failure) (HCC)   Prolonged QT interval   Elevated liver enzymes   COPD (chronic obstructive pulmonary disease) (HCC)   Cocaine abuse (HCC)   Schizoaffective disorder, bipolar type (HCC)   Benign essential HTN   Acid reflux   History of DVT (deep vein thrombosis)   Type 2 diabetes mellitus (HCC)   Status post bilateral BKA.   Dilated cardiomyopathy (HCC)  Resolved Problems:   Seizure (HCC)   Tobacco use disorder   Schizophrenia [facility])  Hospital Course: No notes on file  Assessment and Plan: * Acute CVA (cerebrovascular accident) (HCC) 58 year old male presenting to ED after being found with new onset right sided weakness and slurred speech found to have acute left MCA territory infarct by CT with right sided hemiplegia and severe dysarthric and aphasic speech.  -admit to  progressive for stroke work up in setting of large mobile thrombus in LV, RV and LA  -Neurochecks per protocol -Neurology consulted -MRI brain without contrast pending  -lipid panel pending, A1C of 5.8  -Permissive hypertension first 24 hours <220/110 -high aspiration risk, failed bedside swallow. Oral dysphagia and can not swallow so unsure about laryngeal elevation. Aspiration precautions, ST swallow eval  -PT/ OT/ SLP consult -SW consult likely needs inpatient rehab    LV (left ventricular) mural thrombus Large thrombus of LV, near LV outflow tract as well as mobile clots in RV and LA  Discussed with neurology and typically do not AC for 48 hours following moderate size stroke Brain MRI pending: need this back to help decision making  Consult cardiology for recs on [facility] with risk for embolization/death with mobile thromboses vs. Bleed in setting of acute moderate sized CVA  Attempted to discuss situation with patient and he became agitated and started to talk loudly to me. Unsure if he is grasping the complexity and possible grave outcome of the situation/findings at hand    Chronic systolic CHF (congestive heart failure) (HCC) Does not appear overloaded Repeat echo shows severe biventricular dysfunction with EF <20% with large mobile clots in the LA, RV and LV Strict I/O Medication IV for now as he is not safe for swallow  Strict I/O  Ongoing cocaine use  Consider palliative consult   Elevated liver enzymes Mildly elevated, shock liver in the past Liver enzymes normal in February  Continue to trend   Prolonged QT interval  Optimize electrolytes Keep on telemetry Avoid qt prolonging drugs  Repeat ekg in AM    COPD (chronic obstructive pulmonary disease) (HCC) No s/sx of exacerbation Continue trelegy and SABA prn   Cocaine abuse (HCC) Admits to ongoing use.  Last used a few days ago  Likely contributing to severe systolic CHF   Schizoaffective disorder, bipolar type  (HCC) Start back abilify when he can safely swallow May need IM prn if becomes agitated   Benign essential HTN Permissive HTN in setting of acute CVA Treat if systolic >220/110   Acid reflux Continue protonix   History of DVT (deep vein thrombosis) Per chart review from 01/2024  Previously declined anticoagulation  Per chart review  "patient has declined the use of medications and continue engaging into further discussion to determine the need for chronic therapy"    Type 2 diabetes mellitus (HCC) A1C of 5.8  Appears diet controlled   Status post bilateral BKA. Chronic pain -continue the use of gabapentin, oxycodone and muscle relaxants when can safely swallow PRN IV morphine for now       {Tip this will not be part of the note when signed Body mass index is 24.84 kg/m. , ,  (Optional):26781}  {(NOTE) Pain control PDMP Statment (Optional):26782} Consultants: *** Procedures performed: ***  Disposition: {Plan; Disposition:26390} Diet recommendation:  Discharge Diet Orders (From admission, onward)     Start     Ordered   03/14/24 0000  Diet - low sodium heart healthy        03/14/24 1443           {Diet_Plan:26776} DISCHARGE MEDICATION: Allergies as of 03/14/2024       Reactions   Acetaminophen Hives, Nausea And Vomiting, Rash, Anaphylaxis   PT states that if he takes this he is going to the hospital   Motrin [ibuprofen] Anaphylaxis   Nsaids Anaphylaxis   PT states that if he takes this he is going to the hospital   Lyrica Cr [pregabalin Er] Other (See Comments)   Dystonia Dysarthria    Peanut-containing Drug Products Other (See Comments)   Pt denies allergy to peanut butter 03/02/2021        Medication List     STOP taking these medications    predniSONE 10 MG tablet Commonly known as: DELTASONE       TAKE these medications    apixaban 5 MG Tabs tablet Commonly known as: ELIQUIS Take 1 tablet (5 mg total) by mouth 2 (two) times daily.    ARIPiprazole 5 MG tablet Commonly known as: ABILIFY Take 1 tablet (5 mg total) by mouth daily.   budesonide 0.25 MG/2ML nebulizer solution Commonly known as: PULMICORT Take 2 mLs (0.25 mg total) by nebulization daily.   carvedilol 25 MG tablet Commonly known as: COREG Take 1 tablet (25 mg total) by mouth 2 (two) times daily with a meal. What changed: Another medication with the same name was removed. Continue taking this medication, and follow the directions you see here.   gabapentin 300 MG capsule Commonly known as: NEURONTIN Take 2 capsules (600 mg total) by mouth 3 (three) times daily.   isosorbide-hydrALAZINE 20-37.5 MG tablet Commonly known as: BIDIL Take 1 tablet by mouth 3 (three) times daily.   nicotine 21 mg/24hr patch Commonly known as: NICODERM CQ - dosed in mg/24 hours Place 1 patch (21 mg total) onto the skin daily as needed (Tobacco craving).   oxyCODONE 5 MG immediate release tablet Commonly known as: Roxicodone Take 1  tablet (5 mg total) by mouth every 8 (eight) hours as needed for moderate pain (pain score 4-6) or breakthrough pain.   pantoprazole 40 MG tablet Commonly known as: PROTONIX Take 1 tablet (40 mg total) by mouth daily.   rosuvastatin 5 MG tablet Commonly known as: CRESTOR Take 1 tablet (5 mg total) by mouth at bedtime.   spironolactone 25 MG tablet Commonly known as: ALDACTONE Take 0.5 tablets (12.5 mg total) by mouth daily. Start taking on: March 15, 2024   tiotropium 18 MCG inhalation capsule Commonly known as: Spiriva HandiHaler Place 1 capsule (18 mcg total) into inhaler and inhale daily.   torsemide 20 MG tablet Commonly known as: DEMADEX Take 2 tablets (40 mg total) by mouth daily.   Trelegy Ellipta 100-62.5-25 MCG/ACT Aepb Generic drug: Fluticasone-Umeclidin-Vilant Inhale 1 puff into the lungs daily.   Ventolin HFA 108 (90 Base) MCG/ACT inhaler Generic drug: albuterol Inhale 2 puffs into the lungs every 4 (four) hours as  needed for wheezing or shortness of breath.   albuterol 1.25 MG/3ML nebulizer solution Commonly known as: ACCUNEB Take 3 mLs (1.25 mg total) by nebulization every 6 (six) hours as needed for wheezing.        Discharge Exam: Filed Weights   03/12/24 0701 03/13/24 0500 03/14/24 0500  Weight: 75.3 kg 75 kg 76.3 kg   ***  Condition at discharge: {DC Condition:26389}  The results of significant diagnostics from this hospitalization (including imaging, microbiology, ancillary and laboratory) are listed below for reference.   Imaging Studies: DG CHEST PORT 1 VIEW Result Date: 03/10/2024 CLINICAL DATA:  Dyspnea, difficulty breathing EXAM: PORTABLE CHEST 1 VIEW COMPARISON:  03/09/2024 FINDINGS: Single frontal view of the chest demonstrates stable enlargement of the cardiac silhouette. Persistent pulmonary vascular congestion, with patchy areas of consolidation developing at the lung bases favoring mild edema. No effusion or pneumothorax. No acute bony abnormalities. IMPRESSION: 1. Findings compatible with congestive heart failure and mild bibasilar edema. Electronically Signed   By: Sharlet Salina M.D.   On: 03/10/2024 14:57   DG Chest Port 1 View Result Date: 03/10/2024 CLINICAL DATA:  Shortness of breath EXAM: PORTABLE CHEST 1 VIEW COMPARISON:  03/07/2024 FINDINGS: Cardiomegaly with vascular congestion. No acute airspace disease, pleural effusion or pneumothorax. Aortic atherosclerosis IMPRESSION: Cardiomegaly with vascular congestion. Electronically Signed   By: Jasmine Pang M.D.   On: 03/10/2024 01:47   VAS [place] LOWER EXTREMITY VENOUS (DVT) Result Date: 03/08/2024  Lower Venous DVT Study Patient Name:  [name]  Date of Exam:   03/08/2024 Medical Rec #: [id number]       Accession #:    [id number] Date of Birth: 18-Dec-1965       Patient Gender: M Patient Age:   41 years Exam Location:  [facility] Procedure:      VAS [place] LOWER EXTREMITY VENOUS (DVT) Referring Phys: RAKESH ALVA  --------------------------------------------------------------------------------  Indications: Pain.  Limitations: Patient slightly agitate, could not position properly, nor lay still and bilateral amputations. Comparison Study: Previous study on 1.19.2025. Performing Technologist: Fernande Bras  Examination Guidelines: A complete evaluation includes B-mode imaging, spectral Doppler, color Doppler, and power Doppler as needed of all accessible portions of each vessel. Bilateral testing is considered an integral part of a complete examination. Limited examinations for reoccurring indications may be performed as noted. The reflux portion of the exam is performed with the patient in reverse Trendelenburg.  +---------+---------------+---------+-----------+----------+--------------+ RIGHT    CompressibilityPhasicitySpontaneityPropertiesThrombus Aging +---------+---------------+---------+-----------+----------+--------------+ CFV      Full  Yes      Yes                                 +---------+---------------+---------+-----------+----------+--------------+ SFJ      Full           Yes      Yes                                 +---------+---------------+---------+-----------+----------+--------------+ FV Prox  Partial        Yes      Yes                                 +---------+---------------+---------+-----------+----------+--------------+ FV Mid   Partial        Yes      Yes                                 +---------+---------------+---------+-----------+----------+--------------+ FV DistalPartial        Yes      Yes                                 +---------+---------------+---------+-----------+----------+--------------+ PFV      Full                                                        +---------+---------------+---------+-----------+----------+--------------+ Right AKA.  +---------+---------------+---------+-----------+----------+--------------+  LEFT     CompressibilityPhasicitySpontaneityPropertiesThrombus Aging +---------+---------------+---------+-----------+----------+--------------+ CFV      Full           Yes      Yes                                 +---------+---------------+---------+-----------+----------+--------------+ SFJ      Full           Yes      Yes                                 +---------+---------------+---------+-----------+----------+--------------+ FV Prox  Full                                                        +---------+---------------+---------+-----------+----------+--------------+ FV Mid   Full                                                        +---------+---------------+---------+-----------+----------+--------------+ FV DistalFull                                                        +---------+---------------+---------+-----------+----------+--------------+  PFV      Full                                                        +---------+---------------+---------+-----------+----------+--------------+ POP      Full           No       No                                  +---------+---------------+---------+-----------+----------+--------------+ Left BKA.    Summary: RIGHT: - Findings consistent with age indeterminate deep vein thrombosis involving the right femoral vein.  - No cystic structure found in the popliteal fossa.  LEFT: - There is no evidence of deep vein thrombosis in the lower extremity.  - No cystic structure found in the popliteal fossa.  *See table(s) above for measurements and observations. Electronically signed by Lemar Livings MD on 03/08/2024 at 7:48:30 PM.    Final    DG CHEST PORT 1 VIEW Result Date: 03/07/2024 CLINICAL DATA:  Pneumonia. EXAM: PORTABLE CHEST 1 VIEW COMPARISON:  Radiograph and CT 01/13/2024 FINDINGS: Unchanged cardiomegaly. Stable mediastinal contours. Improved peripheral opacity in the right lung from prior. No new airspace  disease. There is slight increase in peribronchial thickening. No significant effusion. No pneumothorax. IMPRESSION: 1. Improved peripheral opacity in the right lung from prior. No new airspace disease. 2. Slight increase in peribronchial thickening, may be bronchitis or reactive airways disease. 3. Unchanged cardiomegaly. Electronically Signed   By: Narda Rutherford M.D.   On: 03/07/2024 21:34   MR BRAIN WO CONTRAST Result Date: 03/04/2024 CLINICAL DATA:  Stroke follow-up EXAM: MRI HEAD WITHOUT CONTRAST TECHNIQUE: Multiplanar, multiecho pulse sequences of the brain and surrounding structures were obtained without intravenous contrast. COMPARISON:  03/05/2019 and 01/29/2022 FINDINGS: Brain: Area of abnormal diffusion restriction affecting the left basal ganglia and insula with punctate foci that extend to the subcortical white matter of the left frontal and parietal lobes. Chronic microhemorrhage in the left centrum semiovale. There is multifocal hyperintense T2-weighted signal within the white matter. Parenchymal volume and CSF spaces are normal. The midline structures are normal. Multiple old cerebellar infarcts. Vascular: Normal flow voids. Skull and upper cervical spine: Normal calvarium and skull base. Visualized upper cervical spine and soft tissues are normal. Sinuses/Orbits:No paranasal sinus fluid levels or advanced mucosal thickening. No mastoid or middle ear effusion. Normal orbits. IMPRESSION: 1. Acute infarct of the left basal ganglia and insula with punctate foci that extend to the subcortical white matter of the left frontal and parietal lobes. No hemorrhage or mass effect. 2. Multiple old cerebellar infarcts and findings of chronic small vessel ischemia. Electronically Signed   By: Deatra Robinson M.D.   On: 03/04/2024 19:45   ECHOCARDIOGRAM LIMITED Result Date: 03/04/2024    ECHOCARDIOGRAM REPORT   Patient Name:   [name] Date of Exam: 03/04/2024 Medical Rec #:  [id number]      Height:        50.0 in Accession #:    [id number]     Weight:       169.8 lb Date of Birth:  [birth year]      BSA:          1.525 m Patient Age:    57 years  BP:           171/102 mmHg Patient Gender: M              HR:           76 bpm. Exam Location:  Inpatient Procedure: Limited Echo and Color Doppler (Both Spectral and Color Flow Doppler            were utilized during procedure). Indications:    stroke  History:        Patient has prior history of Echocardiogram examinations, most                 recent 01/14/2024.  Sonographer:    Dione Franks RDCS Referring Phys: [phone] DENISE A WOLFE IMPRESSIONS  1. The apex has prominen trabecualtions. There is a large mobile clot in the LV that appears to be attached to the interventricular septum just under the mitral valve. . Left ventricular ejection fraction, by estimation, is <20%. The left ventricle has severely decreased function. The left ventricle demonstrates global hypokinesis. Left ventricular diastolic function could not be evaluated.  2. There is a small RV apical clot. Right ventricular systolic function is severely reduced. The right ventricular size is normal.  3. There is a largr mobile clot in the LA. Left atrial size was mildly dilated.  4. Right atrial size was mildly dilated.  5. The mitral valve is normal in structure. Trivial mitral valve regurgitation. No evidence of mitral stenosis.  6. The aortic valve is normal in structure. Aortic valve regurgitation is not visualized. No aortic stenosis is present. Conclusion(s)/Recommendation(s): Severe biventricular dysfunction with large mobile clots in the LA, RV and LV as described above. FINDINGS  Left Ventricle: The apex has prominen trabecualtions. There is a large mobile clot in the LV that appears to be attached to the interventricular septum just under the mitral valve. Left ventricular ejection fraction, by estimation, is <20%. The left ventricle has severely decreased function. The left ventricle  demonstrates global hypokinesis. The left ventricular internal cavity size was normal in size. There is no left ventricular hypertrophy. Left ventricular diastolic function could not be evaluated. Right Ventricle: There is a small RV apical clot. The right ventricular size is normal. No increase in right ventricular wall thickness. Right ventricular systolic function is severely reduced. Left Atrium: There is a largr mobile clot in the LA. Left atrial size was mildly dilated. Right Atrium: Right atrial size was mildly dilated. Pericardium: There is no evidence of pericardial effusion. Mitral Valve: The mitral valve is normal in structure. Trivial mitral valve regurgitation. No evidence of mitral valve stenosis. Tricuspid Valve: The tricuspid valve is normal in structure. Tricuspid valve regurgitation is trivial. No evidence of tricuspid stenosis. Aortic Valve: The aortic valve is normal in structure. Aortic valve regurgitation is not visualized. No aortic stenosis is present. Pulmonic Valve: The pulmonic valve was not well visualized. Pulmonic valve regurgitation is not visualized. No evidence of pulmonic stenosis. Aorta: The aortic root is normal in size and structure. Venous: The inferior vena cava was not well visualized. IAS/Shunts: No atrial level shunt detected by color flow Doppler. Jules Oar MD Electronically signed by Jules Oar MD Signature Date/Time: 03/04/2024/4:59:08 PM    Final    CT ANGIO HEAD NECK W WO CM W PERF (CODE STROKE) Result Date: 03/04/2024 CLINICAL DATA:  Provided history: Neuro deficit, acute, stroke suspected. EXAM: CT ANGIOGRAPHY HEAD AND NECK CT PERFUSION BRAIN TECHNIQUE: Multidetector CT imaging of the head and neck was  performed using the standard protocol during bolus administration of intravenous contrast. Multiplanar CT image reconstructions and MIPs were obtained to evaluate the vascular anatomy. Carotid stenosis measurements (when applicable) are obtained utilizing  NASCET criteria, using the distal internal carotid diameter as the denominator. Multiphase CT imaging of the brain was performed following IV bolus contrast injection. Subsequent parametric perfusion maps were calculated using RAPID software. RADIATION DOSE REDUCTION: This exam was performed according to the departmental dose-optimization program which includes automated exposure control, adjustment of the mA and/or kV according to patient size and/or use of iterative reconstruction technique. CONTRAST:  Administered contrast not known at this time. COMPARISON:  Non-contrast head CT performed earlier today 03/04/2024. CT angiogram head/neck 02/03/2022. FINDINGS: CTA NECK FINDINGS Aortic arch: Standard aortic branching. A sclerotic plaque within the visualized thoracic aorta and proximal major branch vessels of the neck. No hemodynamically significant innominate or proximal subclavian artery stenosis. Right carotid system: CCA and ICA patent within the neck. Bulky predominately calcified aspect plaque about the carotid bifurcation with severe (greater than 90%) short-segment stenosis at the ICA origin. Noncalcified atherosclerotic plaque elsewhere within the mid and distal CCA resulting in less than 50% stenosis. Partially retropharyngeal course of the cervical ICA. Left carotid system: CCA and ICA patent within the neck without hemodynamically significant stenosis (50% or greater). Mild sclerotic plaque within the mid CCA. Moderate sclerotic plaque about the carotid bifurcation and within the proximal ICA. Vertebral arteries: Vertebral arteries patent within the neck. Progressive severe stenosis of the left vertebral artery proximal V1 segment. Skeleton: The patient is edentulous. Cervical spondylosis. No acute fracture or aggressive osseous lesion. Other neck: No neck mass or cervical lymphadenopathy. Upper chest: No consolidation within the imaged lung apices. Centrilobular emphysema. Review of the MIP images  confirms the above findings CTA HEAD FINDINGS Anterior circulation: The intracranial internal carotid arteries are patent. Atherosclerotic plaque within both vessels with no more than mild stenosis. The M1 middle cerebral arteries are patent. A proximal M2 left middle cerebral artery vessel is occluded at its origin, new from the CTA 02/03/2022 (for instance as seen on series 11, image 25). No right M2 proximal branch occlusion or high-grade proximal stenosis is identified. The anterior cerebral arteries are patent. Developmentally hypoplastic or absent right A1 segment. No intracranial aneurysm is identified. Posterior circulation: The intracranial vertebral arteries are patent. Severe near occlusive stenoses of the left vertebral artery proximal to, at the level of, and distal to the PICA origin. These stenoses are new from the prior CTA of 02/03/2022. The basilar artery is patent. The posterior cerebral arteries are patent. Severe stenosis within a right PCA branch at the P3/P4 junction, new from the prior CTA. Posterior communicating arteries are hypoplastic or absent, bilaterally. Venous sinuses: Within the limitations of contrast timing, no convincing thrombus. Anatomic variants: As described. Review of the MIP images confirms the above findings CT Brain Perfusion Findings: CBF (<30%) Volume: 0mL Perfusion (Tmax>6.0s) volume: (predominately within the left MCA vascular territory, although with some additional foci in the right cerebral watershed territory). Mismatch Volume: Infarction Location: None identified. However, an acute infarct was demonstrated within the left MCA territory on the non-contrast head CT performed earlier today. CTA head impression #1 called by telephone at the time of interpretation on 03/04/2024 at 3:26 pm to provider [facility] , who verbally acknowledged these results. IMPRESSION: CTA neck: 1. The common carotid and internal carotid arteries are patent with neck.  Atherosclerotic plaque bilaterally, as described. Most notably, there is  a severe (greater than 90%) stenosis at the right ICA origin which is similar to the prior CTA of 02/03/2022. 2. The vertebral arteries are patent in the neck. Progressive severe stenosis of the left vertebral artery proximal V1 segment. 3. Aortic Atherosclerosis (ICD10-I70.0) and Emphysema (ICD10-J43.9). CTA head: 1. Acute occlusion of a proximal M2 left middle cerebral artery vessel. 2. Background intracranial atherosclerotic disease with multifocal stenoses, most notably as follows. 3. 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 4. Severe stenosis of a right posterior cerebral artery branch at the P3/P4 junction, also new from the prior exam. CT perfusion head: The perfusion software identifies regions of hypoperfusion totaling 117 mL (utilizing the Tmax>6 seconds threshold). These foci are predominantly located within the left MCA vascular territory, although some foci are present within the right cerebral watershed territory. The perfusion software identifies no core infarct. However, an acute infarct was demonstrated on the non-contrast head CT performed earlier today, as described. The perfusion software reports a mismatch volume of 117 mL. 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 Electronically Signed   By: Jackey Loge D.O.   On: 03/04/2024 15:45   CT HEAD CODE STROKE WO CONTRAST Result Date: 03/04/2024 CLINICAL DATA:  Code stroke. Neuro deficit, acute, stroke suspected. EXAM: CT HEAD WITHOUT CONTRAST TECHNIQUE: Contiguous axial images were obtained from the base of the skull through the vertex without intravenous contrast. RADIATION DOSE REDUCTION: This exam was performed according to the departmental dose-optimization program which includes  automated exposure control, adjustment of the mA and/or kV according to patient size and/or use of iterative reconstruction technique. COMPARISON:  Non-contrast head CT, CT angiogram head/neck and CT venogram head 02/03/2022. FINDINGS: Brain: Mild generalized parenchymal atrophy. Acute left MCA territory infarct affecting the left insula, left caudate nucleus, left lentiform nucleus and left internal capsule. Background moderate patchy and ill-defined hypoattenuation within cerebral white matter, nonspecific but compatible with chronic small vessel ischemic disease. 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 There is no acute intracranial hemorrhage. No extra-axial fluid collection. No evidence of an intracranial mass. No midline shift. Vascular: Asymmetric hyperdensity of the M1/proximal M2 left middle cerebral artery suspicious for the presence of thrombus. Atherosclerotic calcifications. Skull: No calvarial fracture or aggressive osseous lesion. Sinuses/Orbits: No mass or acute finding within the imaged orbits. Chronic medially displaced fracture deformity of the left lamina papyracea. No significant paranasal sinus disease at the imaged levels. ASPECTS (Alberta Stroke Program Early CT Score) - Ganglionic level infarction (caudate, lentiform nuclei, internal capsule, insula, M1-M3 cortex): 3 - Supraganglionic infarction (M4-M6 cortex): 3 Total score (0-10 with 10 being normal): 6 Impressions #1 and #2 called by telephone at the time of interpretation on 03/04/2024 at 2:56 pm to provider Dr. Armanda Magic, who verbally acknowledged these results. IMPRESSION: 1. Acute left MCA territory infarct affecting the insula, caudate nucleus, lentiform nucleus and internal capsule. ASPECTS is 6. 2. Asymmetric hyperdensity of the M1/proximal M2 left middle cerebral artery suspicious for the presence of thrombus. A CTA of the head/neck is recommended for further  evaluation. 3. Background parenchymal atrophy, chronic small vessel ischemic disease and chronic infarcts, as described. Electronically Signed   By: Jackey Loge D.O.   On: 03/04/2024 14:58    Microbiology: Results for orders placed or performed during the hospital encounter of  03/04/24  MRSA Next Gen by PCR, Nasal     Status: None   Collection Time: 03/04/24 11:10 PM   Specimen: Nasal Mucosa; Nasal Swab  Result Value Ref Range Status   MRSA by PCR Next Gen NOT DETECTED NOT DETECTED Final    Comment: (NOTE) The GeneXpert MRSA Assay (FDA approved for NASAL specimens only), is one component of a comprehensive MRSA colonization surveillance program. It is not intended to diagnose MRSA infection nor to guide or monitor treatment for MRSA infections. Test performance is not FDA approved in patients less than 31 years old. Performed at [facility], [address] [place], [name] [address]     Labs: CBC: Recent Labs  Lab 03/08/24 0533 03/09/24 0550 03/10/24 0601 03/14/24 0912  WBC 5.6 5.5 6.4 7.7  HGB 12.9* 13.6 14.9 14.5  HCT 38.3* 40.7 45.1 43.9  MCV 92.7 93.8 94.2 92.2  PLT 187 189 223 281   Basic Metabolic Panel: Recent Labs  Lab 03/09/24 1045 03/10/24 0601 03/14/24 0912  NA  --  138 138  K  --  4.6 4.1  CL  --  102 102  CO2  --  23 26  GLUCOSE  --  108* 103*  BUN  --  14 19  CREATININE  --  1.05 0.95  CALCIUM  --  9.1 9.1  MG 1.7 2.0 1.9   Liver Function Tests: Recent Labs  Lab 03/10/24 0601  AST 17  ALT 30  ALKPHOS 63  BILITOT 1.6*  PROT 7.0  ALBUMIN 3.3*   CBG: Recent Labs  Lab 03/13/24 0813 03/13/24 1209 03/13/24 1646 03/14/24 0804 03/14/24 1147  GLUCAP 86 100* 169* 112* 124*    Discharge time spent: {LESS THAN/GREATER THAN:26388} 30 minutes.  Signed: Trenton Frock, MD [facility] 03/14/2024

## 2024-03-14 NOTE — Progress Notes (Signed)
 Patient refused blood sugar to be checked.  MD notified

## 2024-03-14 NOTE — Plan of Care (Signed)
 Patient refused several assessments this shift, vitals, and CBG. Attempted to re-orient and encourage to follow plan. He was agreeable to take his medications yesterday evening. Also refusing O2 via nasal cannula. Problem: Nutrition: Goal: Risk of aspiration will decrease 03/14/2024 0501 by Drucella Georgi, RN Outcome: Progressing 03/14/2024 0500 by Drucella Georgi, RN Outcome: Not Progressing Goal: Dietary intake will improve 03/14/2024 0501 by Drucella Georgi, RN Outcome: Progressing 03/14/2024 0500 by Drucella Georgi, RN Outcome: Not Progressing   Problem: Clinical Measurements: Goal: Ability to maintain clinical measurements within normal limits will improve 03/14/2024 0501 by Drucella Georgi, RN Outcome: Progressing 03/14/2024 0500 by Drucella Georgi, RN Outcome: Not Progressing Goal: Will remain free from infection 03/14/2024 0501 by Drucella Georgi, RN Outcome: Progressing 03/14/2024 0500 by Drucella Georgi, RN Outcome: Not Progressing Goal: Diagnostic test results will improve 03/14/2024 0501 by Drucella Georgi, RN Outcome: Progressing 03/14/2024 0500 by Drucella Georgi, RN Outcome: Not Progressing Goal: Respiratory complications will improve 03/14/2024 0501 by Drucella Georgi, RN Outcome: Progressing 03/14/2024 0500 by Drucella Georgi, RN Outcome: Not Progressing Goal: Cardiovascular complication will be avoided 03/14/2024 0501 by Drucella Georgi, RN Outcome: Progressing 03/14/2024 0500 by Drucella Georgi, RN Outcome: Not Progressing   Problem: Nutrition: Goal: Adequate nutrition will be maintained 03/14/2024 0501 by Drucella Georgi, RN Outcome: Progressing 03/14/2024 0500 by Drucella Georgi, RN Outcome: Not Progressing

## 2024-03-14 NOTE — Progress Notes (Signed)
 Multiple times this morning I attempted to do an assessment on this patient and he gets verbally aggressive and denies the assessment.  Patient has q4 NIH ordered but unable to complete.  MD paged to notify.  Will continue to give him breaks and re-attempt assessment

## 2024-03-15 DIAGNOSIS — F250 Schizoaffective disorder, bipolar type: Secondary | ICD-10-CM | POA: Diagnosis not present

## 2024-03-15 DIAGNOSIS — I639 Cerebral infarction, unspecified: Secondary | ICD-10-CM | POA: Diagnosis not present

## 2024-03-15 DIAGNOSIS — F1410 Cocaine abuse, uncomplicated: Secondary | ICD-10-CM | POA: Diagnosis not present

## 2024-03-15 LAB — CBC
HCT: 47.1 % (ref 39.0–52.0)
Hemoglobin: 15.9 g/dL (ref 13.0–17.0)
MCH: 30.9 pg (ref 26.0–34.0)
MCHC: 33.8 g/dL (ref 30.0–36.0)
MCV: 91.5 fL (ref 80.0–100.0)
Platelets: 304 10*3/uL (ref 150–400)
RBC: 5.15 MIL/uL (ref 4.22–5.81)
RDW: 15.9 % — ABNORMAL HIGH (ref 11.5–15.5)
WBC: 6.1 10*3/uL (ref 4.0–10.5)
nRBC: 0 % (ref 0.0–0.2)

## 2024-03-15 NOTE — Progress Notes (Signed)
 Patient pull out condom catheter.

## 2024-03-15 NOTE — Progress Notes (Signed)
 Speech Language Pathology Treatment: Dysphagia  Patient Details Name: [name] MRN: [id number] DOB: 1966-09-17 Today's Date: 03/15/2024 Time: 4098-1191 SLP Time Calculation (min) (ACUTE ONLY): 21 min  Assessment / Plan / Recommendation Clinical Impression  Pt initially adamantly refusing trials thin liquid to determine possible upgrade however with encouragement from SLP and RN he was agreeable to sip water and overall, swallow ability appears improved from last week. He then refused more water but agreeable to trials of Coke. He had mild cough with first cup sip water but consumed cup sips Coke without s/s aspiration with impulsivity, taking large sips and not stimulable for smaller sips. Wheezing was inconsistently present following sips however sitter stated he was wheezing frequently this morning before eating breakfast. Pt continues to refuse MBS. Discussed with MD and agreeable with SLP plan to upgrade to thin with full supervision for smaller sips as pt willing to comply. Continue pills with thin (one at a time) or with Ensure.   Pt declined to work on aphasia or dysarthria when requested His spontaneous expression appears somewhat improved from last week. His speech was fluent however significantly dysarthric making it difficult to decipher throughout session. SLP did note one paraphasia. SLP cued him to speak slower, louder and pause between words however his attention was not on therapist and question whether he was listening. When asked if he feels his speech is different than prior he gave some indication that it might be. Continue SLP intervention.     HPI HPI: Pt is a 58 y.o. male who presented to ED with stroke like symptoms. Caregiver found him with slurred speech and right sided weakness. MRI (03/04/24) revealed "Acute infarct of the left basal ganglia and insula with punctate foci that extend to the subcortical white matter of the left frontal and parietal lobes. Multiple old  cerebellar infarcts and findings of chronic small vessel ischemia". Previous BSE (08/21/22) revealed no s/sx of aspiration or dysphagia with regular diet/thin liquids initiated. 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 PMH: cocaine abuse, schizoaffective d/o, depression, T2DM, HTN, GERD, systolic CHF, COPD, s/p b/l lower extremity amputee, chronic pain.      SLP Plan  Continue with current plan of care      Recommendations for follow up therapy are one component of a multi-disciplinary discharge planning process, led by the attending physician.  Recommendations may be updated based on patient status, additional functional criteria and insurance authorization.    Recommendations  Diet recommendations: Dysphagia 3 (mechanical soft);Thin liquid Liquids provided via: Cup;Straw Medication Administration: Whole meds with liquid (or Ensure) Supervision: Patient able to self feed;Full supervision/cueing for compensatory strategies Compensations: Minimize environmental distractions;Slow rate;Small sips/bites Postural Changes and/or Swallow Maneuvers: Seated upright 90 degrees                  Oral care QID   Frequent or constant Supervision/Assistance Dysphagia, unspecified (R13.10)     Continue with current plan of care     [name]  03/15/2024, 10:35 AM

## 2024-03-15 NOTE — TOC Progression Note (Signed)
 Transition of Care [facility]) - Progression Note    Patient Details  Name: [name] MRN: [id number] Date of Birth: February 03, 1966  [facility]) CM/SW Contact  Tandy Fam, [name] Phone Number: 03/15/2024, 12:00 PM  Clinical Narrative:   CSW continuing to follow for disposition. Noting psychiatry consult pending to assist with patient's behaviors, adjust medications. No placement options available for patient at this time. CSW to follow.    Expected Discharge Plan: Skilled Nursing Facility Barriers to Discharge: Continued Medical Work up, English as a second language teacher, Active Substance Use - Placement, Inadequate or no insurance, SNF Pending payor source - LOG, SNF Pending Medicaid, SNF Pending bed offer  Expected Discharge Plan and Services       Living arrangements for the past 2 months: Apartment Expected Discharge Date: 03/14/24                                     Social Determinants of Health (SDOH) Interventions SDOH Screenings   Food Insecurity: Food Insecurity Present (03/06/2024)  Housing: High Risk (03/06/2024)  Transportation Needs: No Transportation Needs (03/06/2024)  Utilities: Patient Declined (03/06/2024)  Alcohol Screen: Medium Risk (06/24/2019)  Depression (PHQ2-9): Low Risk  (02/18/2024)  Recent Concern: Depression (PHQ2-9) - Medium Risk (01/21/2024)  Financial Resource Strain: High Risk (07/24/2021)   Received from [facility], [facility]  Physical Activity: Inactive (02/18/2024)  Social Connections: Unknown (04/05/2022)   Received from [facility], [facility]  Stress: Stress Concern Present (07/24/2021)   Received from [facility], [facility]  Tobacco Use: High Risk (03/04/2024)  Health Literacy: Adequate Health Literacy (02/18/2024)    Readmission Risk Interventions    12/21/2023    9:25 AM 12/09/2022    9:02 AM 12/01/2022    3:56 PM  Readmission Risk Prevention Plan  Transportation Screening Complete Complete Complete  HRI or Home  Care Consult Complete    Social Work Consult for Recovery Care Planning/Counseling Complete    Palliative Care Screening Not Applicable    Medication Review Oceanographer) Complete Complete Complete  PCP or Specialist appointment within 3-5 days of discharge  Complete Complete  HRI or Home Care Consult  Complete Complete  SW Recovery Care/Counseling Consult  Complete Complete  Palliative Care Screening  Not Applicable Not Applicable  Skilled Nursing Facility  Not Applicable Not Applicable  SNF Comments   n/a

## 2024-03-15 NOTE — Consult Note (Addendum)
 [facility] Psychiatric Consult Initial  Patient Name: .[name]  MRN: [id number]  DOB: 07/19/1966  Consult Order details:  Orders (From admission, onward)     Start     Ordered   03/14/24 1515  IP CONSULT TO PSYCHIATRY       Ordering Provider: Tobey Grim, MD  Provider:  (Not yet assigned)  Question Answer Comment  Location [facility]   Reason for Consult? Capacity evaluation -- patient verbally abusive, wants to go home, but oriented only to self.  Lives alone, seems to be unable to care for himself at home.  Essentially refusing all cares here      03/14/24 1514             Mode of Visit: In person    Psychiatry Consult Evaluation  Service Date: March 15, 2024 LOS:  LOS: 11 days  Chief Complaint 58 year old male with a history of cardiomyopathy, cocaine abuse who presented with slurred speech and confusion and right-sided weakness due to a left MCA infarct.  Consult was requested because of patient's agitation, verbally abusive and refusing care.  Primary Psychiatric Diagnoses  Schizoaffective disorder, bipolar type by history. 2.  Substance use disorder, cocaine use disorder 3.  Acute agitation with limited cognitive abilities due to multiple strokes  Assessment  [name] is a 58 y.o. male admitted: Medicallyfor 03/04/2024  2:31 PM for weakness and dizziness due to a stroke. He carries the psychiatric diagnoses of schizoaffective disorder and cocaine use disorder, severe.  And has a past medical history of asthma, COPD, hypertension, recent CVA.Marland Kitchen   His current presentation of agitation is most consistent with schizoaffective disorder and cocaine use disorder.Marland Kitchen He meets criteria for IVC based on lack of insight and aggressive behavior..  Current outpatient psychotropic medications include Abilify and historically he has had a positive response to these medications. He was noncompliant compliant with medications prior to admission as evidenced  by aggressive behavior and agitation. On initial examination, patient was belligerent, labile and refusing all medications.. Please see plan below for detailed recommendations.  When seen today the patient is very agitated, labile demanding to go home and refusing care and medical treatment.  His speech is slurred and is somewhat redirectable for brief periods of time but would not allow vital signs or take medications.  The plan is to initiate an IVC and consider nonemergency forced medications if appropriate.  We will continue to encourage him to cooperate since he has an ACTT team that he is quite involved with his care.  The patient was seen in his room and was noted to be laying in bed.  He is a bilateral amputee of both the legs.  He is awake and alert but extremely loud and belligerent blaming that he wants to go home and he does not want anyone to help him with any treatment.  He maintained fair eye contact.  His speech is rapid and loud and labile and although he denies any auditory or visual hallucinations he seems hypervigilant and guarded and unable to comprehend why he is here.  He was informed that he had a stroke and will need to be treated before discharge.  However he seems to lack insight and understand why he is here.  He continues to demand to leave.  He would not even allow the nurse to do some vital signs. At this point one of the ACT team workers will obtain and the patient recognized him and claims that  he wants to go home with him.  We contacted the ACT team to obtain a list of his medications. Diagnoses:  Active Hospital problems: Principal Problem:   Acute CVA (cerebrovascular accident) [facility]) Active Problems:   Schizoaffective disorder, bipolar type (HCC)   Cocaine abuse (HCC)   Type 2 diabetes mellitus (HCC)   Prolonged QT interval   Acid reflux   History of DVT (deep vein thrombosis)   Status post bilateral BKA.   COPD (chronic obstructive pulmonary disease) (HCC)    Chronic systolic CHF (congestive heart failure) (HCC)   Benign essential HTN   LV (left ventricular) mural thrombus   Elevated liver enzymes   Dilated cardiomyopathy (HCC)    Plan   ## Psychiatric Medication Recommendations:  Restart home medications, Abilify 5 mg twice daily.  May also consider as needed agitation protocol.  ## Medical Decision Making Capacity:  Patient is his own guardian.  If he continues to refuse medications he may have to be IVC'd for instituting on an emergency forced medication  ## Further Work-up:  -- None EKG -- most recent EKG on 03/10/2024 had QtC of 483.  However he has been on Abilify for a long time and is EKG from February had a QTc of 489. -- Pertinent labwork reviewed earlier this admission includes: Continue with monitoring QTc intervals.   ## Disposition:-- Plan Post Discharge/Psychiatric Care Follow-up resources we may consider IVC while the patient in the hospital to improve compliance.  Once stabilized patient may be followed up by the ACT team.  ## Behavioral / Environmental: -Difficult Patient (SELECT OPTIONS FROM BELOW), Recommend using specific terminology regarding PNES, i.e. call the episodes "non-epileptic seizures" rather than "pseudoseizures" as the latter insinuates "fake" or "feigned" symptoms, when the events are a very real experience to the patient and are a physical, non-volitional, manifestation of fear, pain and anxiety. , or Utilize compassion and acknowledge the patient's experiences while setting clear and realistic expectations for care.    ## Safety and Observation Level:  - Based on my clinical evaluation, I estimate the patient to be at low risk of self harm in the current setting. - At this time, we recommend  1:1 Observation. This decision is based on my review of the chart including patient's history and current presentation, interview of the patient, mental status examination, and consideration of suicide risk including  evaluating suicidal ideation, plan, intent, suicidal or self-harm behaviors, risk factors, and protective factors. This judgment is based on our ability to directly address suicide risk, implement suicide prevention strategies, and develop a safety plan while the patient is in the clinical setting. Please contact our team if there is a concern that risk level has changed.  CSSR Risk Category:C-SSRS RISK CATEGORY: No Risk  Suicide Risk Assessment: Patient has following modifiable risk factors for suicide: recklessness and medication noncompliance, which we are addressing by adding medications, using as needed antipsychotics and patient will require a nonemergency forced medication.. Patient has following non-modifiable or demographic risk factors for suicide: male gender, history of suicide attempt, history of self harm behavior, and psychiatric hospitalization Patient has the following protective factors against suicide: Access to outpatient mental health care  Thank you for this consult request. Recommendations have been communicated to the primary team.  We will continue to follow until stabilized.  At this time.   Buzz Cass, MD       History of Present Illness  Relevant Aspects of [facility] Course:  Admitted on 03/04/2024 for weakness  and dizziness as a result of a CVA.Aaron Aas   Patient Report:  Patient states" I want to go home"  Psych ROS:  Depression: Denied Anxiety: Appears anxious Mania (lifetime and current): Past history Psychosis: (lifetime and current): Past history  Collateral information:  [facility]ed [facility] at [facility] on 03/15/2024.  The ACT team came to visit him and following him while in the hospital.  Review of Systems  Neurological:  Positive for dizziness, sensory change and speech change.  Psychiatric/Behavioral:  Positive for hallucinations and substance abuse. The patient is nervous/anxious.      Psychiatric and Social History  Psychiatric History:   Information collected from records  Prev Dx/Sx: Schizoaffective disorder Current Psych Provider: ACT team Home Meds (current): Abilify 5 mg twice a day and hydroxyzine 25 mg every 6 hours as needed Previous Med Trials: Multiple trials Therapy: Currently followed up ACT team  Prior Psych Hospitalization: Last hospitalization was at [facility] in October 2024 Prior Self Harm: History of suicidal behavior Prior Violence: History of agitation and noncompliance  Family Psych History: Unknown Family Hx suicide: Unknown  Social History:  Developmental Hx: Please see records. Educational Hx: Unknown Occupational Hx: Unknown Legal Hx: None noted Living Situation: Patient has his own apartment Spiritual Hx: Unknown Access to weapons/lethal means: Unknown  Substance History Alcohol: History of alcohol use Type of alcohol unknown Last Drink unknown Number of drinks per day unknown History of alcohol withdrawal seizures unknown History of DT's past history Tobacco: Unknown Illicit drugs: Severe cocaine use disorder Prescription drug abuse: None Rehab hx: In the past.  Exam Findings  Physical Exam: Please see the hospitalist note for an H&P Vital Signs:  Temp:  [97.4 F (36.3 C)-99.7 F (37.6 C)] 97.4 F (36.3 C) (04/15 0013) Pulse Rate:  [47-96] 96 (04/15 0013) Resp:  [18-24] 19 (04/15 0013) BP: (117-164)/(69-104) 164/89 (04/15 0013) SpO2:  [90 %-99 %] 97 % (04/15 0015) Blood pressure (!) 164/89, pulse 96, temperature (!) 97.4 F (36.3 C), temperature source Axillary, resp. rate 19, height 5\' 9"  (1.753 m), weight 76.3 kg, SpO2 97%. Body mass index is 24.84 kg/m.  Physical Exam HENT:     Head: Normocephalic.  Neurological:     Mental Status: He is alert.     Comments: The patient recently had a stroke.  He appears to have some slurred speech and is very irritable.     Mental Status Exam: General Appearance: Casual and Disheveled  Orientation:  Full (Time, Place, and  Person)  Memory:  Recent;   Fair  Concentration:  Concentration: Fair and Attention Span: Poor  Recall:  NA  Attention  Poor  Eye Contact:  Fair  Speech:  Pressured, Slurred, and loud and belligerent  Language:  Fair  Volume:  Increased  Mood: Angry and irritable  Affect:  Labile  Thought Process:  Coherent and Linear  Thought Content:  Illogical  Suicidal Thoughts:  No  Homicidal Thoughts:  No  Judgement:  Poor  Insight:  Shallow  Psychomotor Activity:  Increased  Akathisia:  No  Fund of Knowledge:  Fair      Assets:  Housing Resilience Social Support  Cognition:  WNL and Impaired,  Mild  ADL's:  Impaired  AIMS (if indicated):        Other History   These have been pulled in through the EMR, reviewed, and updated if appropriate.  Family History:  The patient's family history includes Drug abuse in his mother; Suicidality in his mother.  Medical History:  Past Medical History:  Diagnosis Date  . Asthma   . Bipolar 1 disorder (HCC)   . CHF (congestive heart failure) (HCC)   . COPD (chronic obstructive pulmonary disease) (HCC)   . Depression   . Diabetes mellitus without complication (HCC)   . GERD (gastroesophageal reflux disease)   . Homelessness   . Hypertension   . QT prolongation   . Schizoaffective disorder [facility])     Surgical History: Past Surgical History:  Procedure Laterality Date  . AMPUTATION Left 12/10/2022   Procedure: LEFT BELOW KNEE AMPUTATION;  Surgeon: Nadara Mustard, MD;  Location: [facility] OR;  Service: Orthopedics;  Laterality: Left;  . 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 IR ANGIO INTRA EXTRACRAN SEL COM CAROTID INNOMINATE BILAT MOD SED  12/17/2020  . IR ANGIO VERTEBRAL SEL VERTEBRAL BILAT MOD SED  12/17/2020  . MULTIPLE EXTRACTIONS WITH ALVEOLOPLASTY N/A 03/25/2023   Procedure: IRRIGATION  AND DEBRIDEMENT, MULTIPLE EXTRACTION WITH ALVEOLOPLASTY X 4 , EXTRACTIONS 4 THROUGH 13, 20 THROUGHT 29;  Surgeon: Vivia Ewing, DMD;  Location: [facility] OR;  Service: Oral Surgery;  Laterality: N/A;  . RADIOLOGY WITH ANESTHESIA N/A 12/17/2020   Procedure: IR WITH ANESTHESIA;  Surgeon: Radiologist, Medication, MD;  Location: [facility] OR;  Service: Radiology;  Laterality: N/A;     Medications:   Current Facility-Administered Medications:  .  albuterol (PROVENTIL) (2.5 MG/3ML) 0.083% nebulizer solution 3 mL, 3 mL, Nebulization, Q6H PRN, Charlott Holler, MD, 3 mL at 03/14/24 0320 .  apixaban (ELIQUIS) tablet 5 mg, 5 mg, Oral, BID, Quenton Fetter, RPH, 5 mg at 03/15/24 0949 .  arformoterol (BROVANA) nebulizer solution 15 mcg, 15 mcg, Nebulization, BID, Charlott Holler, MD, 15 mcg at 03/15/24 0959 .  ARIPiprazole (ABILIFY) tablet 5 mg, 5 mg, Oral, Daily, Paliwal, Aditya, MD, 5 mg at 03/14/24 1008 .  budesonide (PULMICORT) nebulizer solution 0.5 mg, 0.5 mg, Nebulization, BID, Charlott Holler, MD, 0.5 mg at 03/15/24 1000 .  carvedilol (COREG) tablet 6.25 mg, 6.25 mg, Oral, BID WC, Hilty, Lisette Abu, MD, 6.25 mg at 03/15/24 0949 .  feeding supplement (ENSURE ENLIVE / ENSURE PLUS) liquid 237 mL, 237 mL, Oral, BID BM, Pahwani, Ravi, MD, 237 mL at 03/15/24 0950 .  furosemide (LASIX) tablet 40 mg, 40 mg, Oral, Daily, [place], [place], [place], 40 mg at 03/15/24 0949 .  losartan (COZAAR) tablet 50 mg, 50 mg, Oral, Daily, Hilty, Lisette Abu, MD, 50 mg at 03/15/24 0949 .  nicotine (NICODERM CQ - dosed in mg/24 hours) patch 14 mg, 14 mg, Transdermal, Daily, Charlott Holler, MD, 14 mg at 03/13/24 0950 .  oxyCODONE (Oxy IR/ROXICODONE) immediate release tablet 5 mg, 5 mg, Oral, Q4H PRN, Pokhrel, Laxman, MD, 5 mg at 03/14/24 2105 .  pantoprazole (PROTONIX) EC tablet 40 mg, 40 mg, Oral, Daily, Reome, Earle J, RPH, 40 mg at 03/15/24 0949 .  revefenacin (YUPELRI) nebulizer solution 175 mcg, 175 mcg, Nebulization, Daily, Charlott Holler, MD,  175 mcg at 03/15/24 1002 .  rosuvastatin (CRESTOR) tablet 5 mg, 5 mg, Oral, QHS, Charlott Holler, MD, 5 mg at 03/14/24 2105 .  spironolactone (ALDACTONE) tablet 12.5 mg, 12.5 mg, Oral, Daily, Hilty, Lisette Abu, MD, 12.5 mg at 03/15/24 1914  Allergies: Allergies  Allergen Reactions  . Acetaminophen Hives, Nausea And Vomiting, Rash and Anaphylaxis  PT states that if he takes this he is going to the hospital  . Motrin [Ibuprofen] Anaphylaxis  . Nsaids Anaphylaxis    PT states that if he takes this he is going to the hospital  . Lyrica Cr [Pregabalin Er] Other (See Comments)    Dystonia Dysarthria   . Peanut-Containing Drug Products Other (See Comments)    Pt denies allergy to peanut butter 03/02/2021    Buzz Cass, MD

## 2024-03-15 NOTE — Plan of Care (Signed)
  Problem: Education: Goal: Knowledge of disease or condition will improve Outcome: Not Progressing Goal: Knowledge of secondary prevention will improve (MUST DOCUMENT ALL) Outcome: Not Progressing Goal: Knowledge of patient specific risk factors will improve (DELETE if not current risk factor) Outcome: Not Progressing   Problem: Ischemic Stroke/TIA Tissue Perfusion: Goal: Complications of ischemic stroke/TIA will be minimized Outcome: Not Progressing   Problem: Coping: Goal: Will verbalize positive feelings about self Outcome: Not Progressing Goal: Will identify appropriate support needs Outcome: Not Progressing   Problem: Health Behavior/Discharge Planning: Goal: Ability to manage health-related needs will improve Outcome: Not Progressing Goal: Goals will be collaboratively established with patient/family Outcome: Not Progressing   Problem: Self-Care: Goal: Ability to participate in self-care as condition permits will improve Outcome: Not Progressing Goal: Verbalization of feelings and concerns over difficulty with self-care will improve Outcome: Not Progressing Goal: Ability to communicate needs accurately will improve Outcome: Not Progressing   Problem: Nutrition: Goal: Risk of aspiration will decrease Outcome: Not Progressing Goal: Dietary intake will improve Outcome: Not Progressing

## 2024-03-15 NOTE — Progress Notes (Signed)
 OT Cancellation Note  Patient Details Name: [name] MRN: [id number] DOB: Apr 04, 1966   Cancelled Treatment:    Reason Eval/Treat Not Completed: Patient declined, no reason specified.  Patient somewhat combative, refusing and raising his voice.  Will continue to follow and proceed as able   Clementina Cutter 03/15/2024, 5:00 PM Chantal Comment OTR/L

## 2024-03-15 NOTE — Progress Notes (Signed)
 PROGRESS NOTE  [name]  WUJ:[id number] DOB: September 14, 1966 DOA: 03/04/2024 PCP: Georganna Skeans, MD  Consultants  Brief Narrative: 58 year old male with past medical history of cardiomyopathy, active cocaine abuse presented to the hospital with slurred speech and confusion with right sided weakness.  Code stroke was activated and was brought into the hospital.  CT head showed left MCA infarct, CTA head and neck showed M2 occlusion on the left.  2D echocardiogram showed large mobile LV thrombus, LA thrombus and RV apical thrombus with EF of 20%.  Neurology and cardiology were consulted and due to high risk of emboli decision was made to anticoagulate and put the patient in the ICU.  LV thrombus more than 3 cm in the size.  Subsequently, patient was transferred out of the ICU under [facility] care again on 03/07/2024.  SNF was attempted but question arose of patient's capacity and psychiatry consulted on 03/15/2024.   Assessment and Plan:   Acute left MCA CVA - On presentation, CT head noting left MCA infarct with M2 occlusion.  MRI showed left basal ganglia and insula punctate focal ischemia.  Still has some residual facial droop.  SNF placement difficult per TOC.  Palliative care consulted as well however not receptive to their discussion. - Has residual right sided weakness.  Agitation: - Greatly appreciate psychiatry input. -Patient meets criteria for IVC based on lack of insight and aggressive behavior. -Home Abilify restarted. -We will watch to see how much patient refuses medications because may need to be IVC need for emergency forced medication. -Greatly complicates discharge planning   LV/LA/RV thrombus and RLE DVT - Noted on echo, showing multiple clot attached to the septum along with small RV clot as well.  Was initiated on anticoagulation initially transition to ICU.  Since transitioned out of ICU.  Continues on anticoagulation.  Hypercoagulable workup noting mildly low Antithrombin  activity, low protein C activity.  - Cardiology following until today.  They have signed off.  Appreciate input. -Continue Eliquis   Acute on chronic hypoxic respiratory failure - Currently on 4 L nasal cannula.  Likely secondary to acute HFrEF exacerbation.  Diuretics on board.  Will work to wean O2 as tolerated.  Patient states he has O2 at home as well as concentrator.     Acute on chronic HFrEF - EF on presentation noting severely reduced function, global hypokinesis, EF less than 20%.  Currently on spironolactone, Coreg, losartan.   P.o. Lasix 40 mg daily.  Urine output excellent.   CAD - Not a candidate for invasive workup given extensive substance abuse appliance.   Hypertension/hyperlipidemia - Medication regimen above.   Diabetes mellitus -Insulin sliding scale on board.   Polysubstance abuse - Drug screen positive for cocaine, opiates, benzos.  Encourage cessation.   Goals of care - Evaluated by palliative care however seemed uninterested in anything they had to offer.  Disposition difficult given patient's substance abuse.  Unfortunately, with his poor EF and multiple comorbidities, overall prognosis is likely very poor.   -Initial plan today was discharged home because of how adamant he is about leaving the hospital.  However on further evaluation I am not sure about his overall capacity to make medical decisions for himself after his recent stroke..   - Does not seem concerned with his multiple diagnoses, states he "feels fine". -See psych consultation for further details, restarted home Abilify.  Will likely be admitted for some time as we await his psychiatric condition to stabilize      DVT  prophylaxis:   apixaban (ELIQUIS) tablet 5 mg  Code Status:   Code Status: Full Code Level of care: Telemetry Medical Status is: Inpatient Dispo: Awaiting discharge to possible SNF.  He himself cannot physically leave the hospital because of his bilateral BKA's.   Consults  called: Cardiology, psychiatry  Subjective: Angry today.  Not very interactive.  Verbally abusive towards staff.Yells that he wants to go home.  Objective: Vitals:   03/14/24 2031 03/15/24 0013 03/15/24 0015 03/15/24 1629  BP: 117/69 (!) 164/89  (!) 84/66  Pulse: (!) 47 96  68  Resp: 18 19  16   Temp: (!) 97.5 F (36.4 C) (!) 97.4 F (36.3 C)  99.6 F (37.6 C)  TempSrc: Oral Axillary  Oral  SpO2: 99% 90% 97% 98%  Weight:      Height:        Intake/Output Summary (Last 24 hours) at 03/15/2024 1747 Last data filed at 03/15/2024 1702 Gross per 24 hour  Intake 300 ml  Output 1000 ml  Net -700 ml   Filed Weights   03/12/24 0701 03/13/24 0500 03/14/24 0500  Weight: 75.3 kg 75 kg 76.3 kg   Body mass index is 24.84 kg/m.  Gen: 58 y.o. male in no apparent distress.  Nontoxic Pulm: Non-labored breathing.  Clear to auscultation bilaterally.  CV: Regular rate and rhythm. No murmur, rub, or gallop. No JVD Ext: Warm, no deformities Skin: No rashes, lesions Neuro: Alert and oriented to person.  Right upper extremity weakness appreciated.  Garbled speech that is somewhat difficult to understand. Psych: Agitated.   I have personally reviewed the following labs and images: CBC: Recent Labs  Lab 03/09/24 0550 03/10/24 0601 03/14/24 0912 03/15/24 1351  WBC 5.5 6.4 7.7 6.1  HGB 13.6 14.9 14.5 15.9  HCT 40.7 45.1 43.9 47.1  MCV 93.8 94.2 92.2 91.5  PLT 189 223 281 304   BMP &GFR Recent Labs  Lab 03/09/24 1045 03/10/24 0601 03/14/24 0912  NA  --  138 138  K  --  4.6 4.1  CL  --  102 102  CO2  --  23 26  GLUCOSE  --  108* 103*  BUN  --  14 19  CREATININE  --  1.05 0.95  CALCIUM  --  9.1 9.1  MG 1.7 2.0 1.9   Estimated Creatinine Clearance: 87.9 mL/min (by C-G formula based on SCr of 0.95 mg/dL). Liver & Pancreas: Recent Labs  Lab 03/10/24 0601  AST 17  ALT 30  ALKPHOS 63  BILITOT 1.6*  PROT 7.0  ALBUMIN 3.3*   No results for input(s): "LIPASE", "AMYLASE" in  the last 168 hours. No results for input(s): "AMMONIA" in the last 168 hours. Diabetic: No results for input(s): "HGBA1C" in the last 72 hours. Recent Labs  Lab 03/13/24 1209 03/13/24 1646 03/14/24 0804 03/14/24 1147 03/14/24 2032  GLUCAP 100* 169* 112* 124* 123*   Cardiac Enzymes: No results for input(s): "CKTOTAL", "CKMB", "CKMBINDEX", "TROPONINI" in the last 168 hours. No results for input(s): "PROBNP" in the last 8760 hours. Coagulation Profile: No results for input(s): "INR", "PROTIME" in the last 168 hours. Thyroid Function Tests: No results for input(s): "TSH", "T4TOTAL", "FREET4", "T3FREE", "THYROIDAB" in the last 72 hours. Lipid Profile: No results for input(s): "CHOL", "HDL", "LDLCALC", "TRIG", "CHOLHDL", "LDLDIRECT" in the last 72 hours. Anemia Panel: No results for input(s): "VITAMINB12", "FOLATE", "FERRITIN", "TIBC", "IRON", "RETICCTPCT" in the last 72 hours. Urine analysis:    Component Value Date/Time   COLORURINE AMBER (  A) 03/05/2024 1122   APPEARANCEUR HAZY (A) 03/05/2024 1122   LABSPEC >1.046 (H) 03/05/2024 1122   PHURINE 5.0 03/05/2024 1122   GLUCOSEU NEGATIVE 03/05/2024 1122   HGBUR NEGATIVE 03/05/2024 1122   BILIRUBINUR NEGATIVE 03/05/2024 1122   KETONESUR 5 (A) 03/05/2024 1122   PROTEINUR 30 (A) 03/05/2024 1122   NITRITE NEGATIVE 03/05/2024 1122   LEUKOCYTESUR NEGATIVE 03/05/2024 1122   Sepsis Labs: Invalid input(s): "PROCALCITONIN", "LACTICIDVEN"  Microbiology: No results found for this or any previous visit (from the past 240 hours).   Radiology Studies: No results found.  Scheduled Meds:  apixaban  5 mg Oral BID   arformoterol  15 mcg Nebulization BID   ARIPiprazole  5 mg Oral Daily   budesonide (PULMICORT) nebulizer solution  0.5 mg Nebulization BID   carvedilol  6.25 mg Oral BID WC   feeding supplement  237 mL Oral BID BM   furosemide  40 mg Oral Daily   losartan  50 mg Oral Daily   nicotine  14 mg Transdermal Daily   pantoprazole   40 mg Oral Daily   revefenacin  175 mcg Nebulization Daily   rosuvastatin  5 mg Oral QHS   spironolactone  12.5 mg Oral Daily   Continuous Infusions:   LOS: 11 days   35 minutes with more than 50% spent in reviewing records, counseling patient/family and coordinating care.  Trenton Frock, MD [facility] [email or web address] 03/15/2024, 5:47 PM

## 2024-03-15 NOTE — Progress Notes (Signed)
 Patient pulls out condom catheter.

## 2024-03-15 NOTE — Progress Notes (Addendum)
 0830 patient alert to self and place not allowing myself RN or MD to do assessment. Patient refusing breathing treatments. 1000 patient took meds PO and allowed RN to give breathing treatments lungs sound are rhonchi and tight. 1700 patient non compliant again wont allow vitals or assessment refusing meds also

## 2024-03-15 NOTE — Progress Notes (Signed)
 Patient refused blood pressure to be taken. Patient irritable but able to sleep afterwards. Several attempts to get the vital signs failed.

## 2024-03-15 NOTE — Progress Notes (Signed)
 Patient pulls out condom catheter and oxygen cannula. O2 sats at room air at 96%.

## 2024-03-15 NOTE — Plan of Care (Signed)
  Problem: Education: Goal: Knowledge of disease or condition will improve Outcome: Not Progressing Goal: Knowledge of secondary prevention will improve (MUST DOCUMENT ALL) Outcome: Not Progressing Goal: Knowledge of patient specific risk factors will improve (DELETE if not current risk factor) Outcome: Not Progressing   Problem: Ischemic Stroke/TIA Tissue Perfusion: Goal: Complications of ischemic stroke/TIA will be minimized Outcome: Not Progressing   Problem: Coping: Goal: Will verbalize positive feelings about self Outcome: Not Progressing Goal: Will identify appropriate support needs Outcome: Not Progressing   Problem: Health Behavior/Discharge Planning: Goal: Ability to manage health-related needs will improve Outcome: Not Progressing Goal: Goals will be collaboratively established with patient/family Outcome: Not Progressing   Problem: Self-Care: Goal: Ability to participate in self-care as condition permits will improve Outcome: Not Progressing Goal: Verbalization of feelings and concerns over difficulty with self-care will improve Outcome: Not Progressing Goal: Ability to communicate needs accurately will improve Outcome: Not Progressing   Problem: Nutrition: Goal: Risk of aspiration will decrease Outcome: Progressing Goal: Dietary intake will improve Outcome: Progressing

## 2024-03-16 DIAGNOSIS — I639 Cerebral infarction, unspecified: Secondary | ICD-10-CM | POA: Diagnosis not present

## 2024-03-16 DIAGNOSIS — F250 Schizoaffective disorder, bipolar type: Secondary | ICD-10-CM | POA: Diagnosis not present

## 2024-03-16 DIAGNOSIS — F1410 Cocaine abuse, uncomplicated: Secondary | ICD-10-CM | POA: Diagnosis not present

## 2024-03-16 NOTE — TOC Progression Note (Signed)
 Transition of Care [facility]) - Progression Note    Patient Details  Name: [name] MRN: [id number] Date of Birth: 10/22/1966  Transition of [facility]) CM/SW Contact  Baldemar Lenis, [name] Phone Number: 03/16/2024, 3:43 PM  Clinical Narrative:   CSW following for disposition. CSW updated by medical team that patient lacks capacity, needs an evaluation for a guardian. CSW to contact APS to provide report for possible guardianship. CSW also met with patient's ACTT team member, French Ana, at bedside to discuss patient's information. French Ana has been working with the patient at home, also in agreement that the patient is much worse off after this stroke and he cannot return home alone. He will need placement in a facility long term.   Per French Ana, patient has a payee that manages his check and his bills, she will get that information to CSW to have for patient's records. French Ana indicated that patient has no other family contacts, his ACTT team is his primary source of support along with a caregiver part time. CSW discussed with French Ana about obtaining patient's wheelchair to increase his independence when he is more compliant, as he was able to mobilize with his wheelchair on his last admission and that helped his mood. ACTT team to bring patient's wheelchair to the hospital. CSW to follow.    Expected Discharge Plan: Skilled Nursing Facility Barriers to Discharge: Continued Medical Work up, English as a second language teacher, Active Substance Use - Placement, Inadequate or no insurance, SNF Pending payor source - LOG, SNF Pending Medicaid, SNF Pending bed offer  Expected Discharge Plan and Services       Living arrangements for the past 2 months: Apartment Expected Discharge Date: 03/14/24                                     Social Determinants of Health (SDOH) Interventions SDOH Screenings   Food Insecurity: Food Insecurity Present (03/06/2024)  Housing: High Risk (03/06/2024)  Transportation  Needs: No Transportation Needs (03/06/2024)  Utilities: Patient Declined (03/06/2024)  Alcohol Screen: Medium Risk (06/24/2019)  Depression (PHQ2-9): Low Risk  (02/18/2024)  Recent Concern: Depression (PHQ2-9) - Medium Risk (01/21/2024)  Financial Resource Strain: High Risk (07/24/2021)   Received from [facility], [facility]  Physical Activity: Inactive (02/18/2024)  Social Connections: Unknown (04/05/2022)   Received from [facility], [facility]  Stress: Stress Concern Present (07/24/2021)   Received from [facility], [facility]  Tobacco Use: High Risk (03/04/2024)  Health Literacy: Adequate Health Literacy (02/18/2024)    Readmission Risk Interventions    12/21/2023    9:25 AM 12/09/2022    9:02 AM 12/01/2022    3:56 PM  Readmission Risk Prevention Plan  Transportation Screening Complete Complete Complete  HRI or Home Care Consult Complete    Social Work Consult for Recovery Care Planning/Counseling Complete    Palliative Care Screening Not Applicable    Medication Review Oceanographer) Complete Complete Complete  PCP or Specialist appointment within 3-5 days of discharge  Complete Complete  HRI or Home Care Consult  Complete Complete  SW Recovery Care/Counseling Consult  Complete Complete  Palliative Care Screening  Not Applicable Not Applicable  Skilled Nursing Facility  Not Applicable Not Applicable  SNF Comments   n/a

## 2024-03-16 NOTE — Progress Notes (Signed)
 Patient was apologetic for refusing medications earlier and requested them now. The prior meds were kept in a plastic bag by the charge nurse Sherrlyn Dolores, RN then given to me in case he changed his mind.  Meds were administered as noted. Charge nurse witnessed this administration as there was an oxycodone among these meds that is being administered 3 hours after being pulled. MD updated.

## 2024-03-16 NOTE — Progress Notes (Signed)
 Physical Therapy Treatment Patient Details Name: [name] MRN: [id number] DOB: 1966-08-05 Today's Date: 03/16/2024   History of Present Illness Patient is 58 y.o. male presented to ED on 03/04/24 with stroke like symptoms when caregiver found him with slurred speech and right sided weakness. Head CT reveals Acute left MCA territory infarct affecting the insula, caudate nucleus, lentiform nucleus and internal capsule. PMH significant of  Schizoaffective disorder, heart failure with reduced ejection fraction, pretension, DMII, COPD, s/p bilateral amputations, chronic pain, and polysubstance abuse.    PT Comments  Pt agreeable to therapy today after refusals 2/2 agitation all week. Pt able to perform seated therex and lateral scooting along EOB with supervision prior to refusing further mobility. No change in DC/DME recs at this time. Plan for [facility] transfer training next session.     If plan is discharge home, recommend the following: Two people to help with walking and/or transfers;Two people to help with bathing/dressing/bathroom;Assistance with cooking/housework;Assistance with feeding;Direct supervision/assist for medications management;Assist for transportation;Help with stairs or ramp for entrance;Supervision due to cognitive status   Can travel by private vehicle     No  Equipment Recommendations  Wheelchair cushion (measurements PT);Wheelchair (measurements PT);Hoyer lift;Hospital bed    Recommendations for Other Services       Precautions / Restrictions Precautions Precautions: Fall;Other (comment) Recall of Precautions/Restrictions: Impaired Precaution/Restrictions Comments: h/o BLE amputations Restrictions Weight Bearing Restrictions Per Provider Order: No     Mobility  Bed Mobility               General bed mobility comments: Pt seated EOB    Transfers Overall transfer level: Needs assistance Equipment used: None Transfers: Bed to chair/wheelchair/BSC             Lateral/Scoot Transfers: Supervision General transfer comment: Pt able to perform lateral scoots along EOB with supervision however refused further mobility.    Ambulation/Gait               General Gait Details: Pt unable   Stairs             Wheelchair Mobility     Tilt Bed    Modified Rankin (Stroke Patients Only) Modified Rankin (Stroke Patients Only) Pre-Morbid Rankin Score: Slight disability Modified Rankin: Severe disability     Balance Overall balance assessment: Needs assistance Sitting-balance support: Single extremity supported, Feet unsupported Sitting balance-Leahy Scale: Fair                                      Hotel manager: Impaired Factors Affecting Communication: Difficulty expressing self;Reduced clarity of speech  Cognition Arousal: Alert Behavior During Therapy: WFL for tasks assessed/performed, Flat affect   PT - Cognitive impairments: Difficult to assess Difficult to assess due to: Impaired communication                     PT - Cognition Comments: 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 Following commands: Impaired Following commands impaired: Follows one step commands inconsistently, Follows one step commands with increased time    Cueing Cueing Techniques: Verbal cues, Tactile cues, Gestural cues  Exercises Other Exercises Other Exercises: R lateral lean through elbow x 10    General Comments General comments (skin integrity, edema, etc.): VSS      Pertinent Vitals/Pain Pain Assessment Pain Assessment: No/denies pain    Home Living  Prior Function            PT Goals (current goals can now be found in the care plan section) Progress towards PT goals: Progressing toward goals    Frequency    Min 2X/week      PT Plan      Co-evaluation              AM-PAC PT "6 Clicks" Mobility   Outcome Measure  Help needed turning from  your back to your side while in a flat bed without using bedrails?: A Lot Help needed moving from lying on your back to sitting on the side of a flat bed without using bedrails?: A Lot Help needed moving to and from a bed to a chair (including a wheelchair)?: Total Help needed standing up from a chair using your arms (e.g., wheelchair or bedside chair)?: Total Help needed to walk in hospital room?: Total Help needed climbing 3-5 steps with a railing? : Total 6 Click Score: 8    End of Session   Activity Tolerance: Patient tolerated treatment well;Treatment limited secondary to agitation Patient left: in bed;with bed alarm set;with call bell/phone within reach Nurse Communication: Mobility status PT Visit Diagnosis: Other abnormalities of gait and mobility (R26.89);Muscle weakness (generalized) (M62.81);Other symptoms and signs involving the nervous system (R29.898);Hemiplegia and hemiparesis Hemiplegia - Right/Left: Right Hemiplegia - caused by: Cerebral infarction     Time: 2440-1027 PT Time Calculation (min) (ACUTE ONLY): 9 min  Charges:    $Therapeutic Exercise: 8-22 mins PT General Charges $$ ACUTE PT VISIT: 1 Visit                     Gwenlyn Hottinger B, PT, DPT Acute Rehab Services [phone]    Carlia Bomkamp 03/16/2024, 4:16 PM

## 2024-03-16 NOTE — Progress Notes (Signed)
 Patient asked for pain medication. When am meds brought to room, he refused all meds and all care. States he wants to see his doctor to discharge him. Message sent to his MD.

## 2024-03-16 NOTE — Plan of Care (Signed)
  Problem: Education: Goal: Knowledge of disease or condition will improve Outcome: Progressing Goal: Knowledge of secondary prevention will improve (MUST DOCUMENT ALL) Outcome: Progressing Goal: Knowledge of patient specific risk factors will improve (DELETE if not current risk factor) Outcome: Progressing   Problem: Ischemic Stroke/TIA Tissue Perfusion: Goal: Complications of ischemic stroke/TIA will be minimized Outcome: Progressing   Problem: Coping: Goal: Will verbalize positive feelings about self Outcome: Progressing Goal: Will identify appropriate support needs Outcome: Progressing   Problem: Health Behavior/Discharge Planning: Goal: Ability to manage health-related needs will improve Outcome: Progressing Goal: Goals will be collaboratively established with patient/family Outcome: Progressing   Problem: Self-Care: Goal: Ability to participate in self-care as condition permits will improve Outcome: Progressing Goal: Verbalization of feelings and concerns over difficulty with self-care will improve Outcome: Progressing Goal: Ability to communicate needs accurately will improve Outcome: Progressing   Problem: Nutrition: Goal: Risk of aspiration will decrease Outcome: Progressing Goal: Dietary intake will improve Outcome: Progressing

## 2024-03-16 NOTE — Progress Notes (Signed)
 PROGRESS NOTE  [name]  WUJ:[id number] DOB: 21-Mar-1966 DOA: 03/04/2024 PCP: Georganna Skeans, MD  Consultants  Brief Narrative: 58 year old male with past medical history of cardiomyopathy, active cocaine abuse presented to the hospital with slurred speech and confusion with right sided weakness.  Code stroke was activated and was brought into the hospital.  CT head showed left MCA infarct, CTA head and neck showed M2 occlusion on the left.  2D echocardiogram showed large mobile LV thrombus, LA thrombus and RV apical thrombus with EF of 20%.  Neurology and cardiology were consulted and due to high risk of emboli decision was made to anticoagulate and put the patient in the ICU.  LV thrombus more than 3 cm in the size.  Subsequently, patient was transferred out of the ICU under [facility] care again on 03/07/2024.  SNF was attempted but question arose of patient's capacity and psychiatry consulted on 03/15/2024.  Deemed to not have capacity and TOC now working on next steps in care.     Assessment and Plan:   Acute left MCA CVA - On presentation, CT head noting left MCA infarct with M2 occlusion.  MRI showed left basal ganglia and insula punctate focal ischemia.  Still has some residual facial droop.  SNF placement difficult per TOC.  Palliative care consulted as well however not receptive to their discussion. - Has residual right sided weakness. - no changes.  Agitation: - Greatly appreciate psychiatry input. -Patient meets criteria for IVC based on lack of insight and aggressive behavior. -Home Abilify restarted.  Still intermittently refusing cares. Will add PRN abilify for further agitation.  -We will watch to see how much patient refuses medications because may need to be IVC need for emergency forced medication. -Greatly complicates discharge planning   LV/LA/RV thrombus and RLE DVT - Noted on echo, showing multiple clot attached to the septum along with small RV clot as well.  Was initiated on  anticoagulation initially transition to ICU.  Since transitioned out of ICU.  Continues on anticoagulation.  Hypercoagulable workup noting mildly low Antithrombin activity, low protein C activity.  - Cardiology following previously.  They have signed off.  Appreciate input. -Continue Eliquis   Acute on chronic hypoxic respiratory failure - Resolved. -  Likely secondary to acute HFrEF exacerbation.  Diuretics on board.  Patient states he has O2 at home as well as concentrator.     Acute on chronic HFrEF - EF on presentation noting severely reduced function, global hypokinesis, EF less than 20%.  Currently on spironolactone, Coreg, losartan.   P.o. Lasix 40 mg daily.  Urine output excellent.   CAD - Not a candidate for invasive workup given extensive substance abuse appliance.   Hypertension/hyperlipidemia - Medication regimen above.   Diabetes mellitus -Insulin sliding scale on board.   Polysubstance abuse - Drug screen positive for cocaine, opiates, benzos.  Encourage cessation.   Goals of care - still adamant he is fine and nothing is wrong, did not have a stroke. -See psych consultation for further details, restarted home Abilify.  Will likely be admitted for some time as we await his psychiatric condition to stabilize - we've started APS/guardianship paperwork       DVT prophylaxis:   apixaban (ELIQUIS) tablet 5 mg  Code Status:   Code Status: Full Code Level of care: Telemetry Medical Status is: Inpatient Dispo: Unclear.  Unable to be discharged due to lacking capacity   Consults called: Cardiology, psychiatry  Subjective: Angry today. Verbally abusive towards staff.Yells that  he wants to go home.  Objective: Vitals:   03/15/24 1629 03/15/24 2049 03/16/24 0500 03/16/24 0826  BP: (!) 84/66 (P) 115/86 (!) 138/107 113/78  Pulse: 68 (P) 75 69 65  Resp: 16 (P) 16 18 20   Temp: 99.6 F (37.6 C) (P) 98.2 F (36.8 C)  97.6 F (36.4 C)  TempSrc: Oral (P) Oral  Oral   SpO2: 98% (P) 95% 94% 100%  Weight:   70.1 kg   Height:        Intake/Output Summary (Last 24 hours) at 03/16/2024 1411 Last data filed at 03/15/2024 2300 Gross per 24 hour  Intake 320 ml  Output 1000 ml  Net -680 ml   Filed Weights   03/13/24 0500 03/14/24 0500 03/16/24 0500  Weight: 75 kg 76.3 kg 70.1 kg   Body mass index is 22.82 kg/m.  Gen: 58 y.o. male in no apparent distress.  Nontoxic.  Lying in bed on side, awake on my exam. Pulm: Non-labored breathing.  Clear to auscultation bilaterally.  CV: Regular rate and rhythm. No murmur, rub, or gallop. No JVD Ext: Warm, no deformities Skin: No rashes, lesions Neuro: Alert and oriented to person.  Right upper extremity weakness appreciated.  Garbled speech that is somewhat difficult to understand. Psych: Agitated.   I have personally reviewed the following labs and images: CBC: Recent Labs  Lab 03/10/24 0601 03/14/24 0912 03/15/24 1351  WBC 6.4 7.7 6.1  HGB 14.9 14.5 15.9  HCT 45.1 43.9 47.1  MCV 94.2 92.2 91.5  PLT 223 281 304   BMP &GFR Recent Labs  Lab 03/10/24 0601 03/14/24 0912  NA 138 138  K 4.6 4.1  CL 102 102  CO2 23 26  GLUCOSE 108* 103*  BUN 14 19  CREATININE 1.05 0.95  CALCIUM 9.1 9.1  MG 2.0 1.9   Estimated Creatinine Clearance: 87.1 mL/min (by C-G formula based on SCr of 0.95 mg/dL). Liver & Pancreas: Recent Labs  Lab 03/10/24 0601  AST 17  ALT 30  ALKPHOS 63  BILITOT 1.6*  PROT 7.0  ALBUMIN 3.3*   No results for input(s): "LIPASE", "AMYLASE" in the last 168 hours. No results for input(s): "AMMONIA" in the last 168 hours. Diabetic: No results for input(s): "HGBA1C" in the last 72 hours. Recent Labs  Lab 03/13/24 1209 03/13/24 1646 03/14/24 0804 03/14/24 1147 03/14/24 2032  GLUCAP 100* 169* 112* 124* 123*   Cardiac Enzymes: No results for input(s): "CKTOTAL", "CKMB", "CKMBINDEX", "TROPONINI" in the last 168 hours. No results for input(s): "PROBNP" in the last 8760  hours. Coagulation Profile: No results for input(s): "INR", "PROTIME" in the last 168 hours. Thyroid Function Tests: No results for input(s): "TSH", "T4TOTAL", "FREET4", "T3FREE", "THYROIDAB" in the last 72 hours. Lipid Profile: No results for input(s): "CHOL", "HDL", "LDLCALC", "TRIG", "CHOLHDL", "LDLDIRECT" in the last 72 hours. Anemia Panel: No results for input(s): "VITAMINB12", "FOLATE", "FERRITIN", "TIBC", "IRON", "RETICCTPCT" in the last 72 hours. Urine analysis:    Component Value Date/Time   COLORURINE AMBER (A) 03/05/2024 1122   APPEARANCEUR HAZY (A) 03/05/2024 1122   LABSPEC >1.046 (H) 03/05/2024 1122   PHURINE 5.0 03/05/2024 1122   GLUCOSEU NEGATIVE 03/05/2024 1122   HGBUR NEGATIVE 03/05/2024 1122   BILIRUBINUR NEGATIVE 03/05/2024 1122   KETONESUR 5 (A) 03/05/2024 1122   PROTEINUR 30 (A) 03/05/2024 1122   NITRITE NEGATIVE 03/05/2024 1122   LEUKOCYTESUR NEGATIVE 03/05/2024 1122   Sepsis Labs: Invalid input(s): "PROCALCITONIN", "LACTICIDVEN"  Microbiology: No results found for this or any  previous visit (from the past 240 hours).   Radiology Studies: No results found.  Scheduled Meds:  apixaban  5 mg Oral BID   arformoterol  15 mcg Nebulization BID   ARIPiprazole  5 mg Oral Daily   budesonide (PULMICORT) nebulizer solution  0.5 mg Nebulization BID   carvedilol  6.25 mg Oral BID WC   feeding supplement  237 mL Oral BID BM   furosemide  40 mg Oral Daily   losartan  50 mg Oral Daily   nicotine  14 mg Transdermal Daily   pantoprazole  40 mg Oral Daily   revefenacin  175 mcg Nebulization Daily   rosuvastatin  5 mg Oral QHS   spironolactone  12.5 mg Oral Daily   Continuous Infusions:   LOS: 12 days   35 minutes with more than 50% spent in reviewing records, counseling patient/family and coordinating care.  Trenton Frock, MD [facility] [email or web address] 03/16/2024, 2:11 PM

## 2024-03-16 NOTE — Consult Note (Signed)
 [facility] Psychiatric Consult Initial  Patient Name: .[name]  MRN: [id number]  DOB: [birth year]  Consult Order details:  Orders (From admission, onward)     Start     Ordered   03/14/24 1515  IP CONSULT TO PSYCHIATRY       Ordering Provider: Tobey Grim, MD  Provider:  (Not yet assigned)  Question Answer Comment  Location [facility]   Reason for Consult? Capacity evaluation -- patient verbally abusive, wants to go home, but oriented only to self.  Lives alone, seems to be unable to care for himself at home.  Essentially refusing all cares here      03/14/24 1514             Mode of Visit: In person    Psychiatry Consult Evaluation  Service Date: March 16, 2024 LOS:  LOS: 12 days  Chief Complaint 58 year old male with a history of cardiomyopathy, cocaine abuse who presented with slurred speech and confusion and right-sided weakness due to a left MCA infarct.  Consult was requested because of patient's agitation, verbally abusive and refusing care.  Primary Psychiatric Diagnoses  Schizoaffective disorder, bipolar type by history. 2.  Substance use disorder, cocaine use disorder 3.  Acute agitation with limited cognitive abilities due to multiple strokes  Assessment  [name] is a 58 y.o. male admitted: Medicallyfor 03/04/2024  2:31 PM for weakness and dizziness due to a stroke. He carries the psychiatric diagnoses of schizoaffective disorder and cocaine use disorder, severe.  And has a past medical history of asthma, COPD, hypertension, recent CVA..    03/15/2024: His current presentation of agitation is most consistent with schizoaffective disorder and cocaine use disorder.Marland Kitchen He meets criteria for IVC based on lack of insight and aggressive behavior..  Current outpatient psychotropic medications include Abilify and historically he has had a positive response to these medications. He was noncompliant compliant with medications prior to admission  as evidenced by aggressive behavior and agitation. On initial examination, patient was belligerent, labile and refusing all medications.. Please see plan below for detailed recommendations.  When seen today the patient is very agitated, labile demanding to go home and refusing care and medical treatment.  His speech is slurred and is somewhat redirectable for brief periods of time but would not allow vital signs or take medications.  The plan is to initiate an IVC and consider nonemergency forced medications if appropriate.  We will continue to encourage him to cooperate since he has an ACTT team that he is quite involved with his care.  The patient was seen in his room and was noted to be laying in bed.  He is a bilateral amputee of both the legs.  He is awake and alert but extremely loud and belligerent blaming that he wants to go home and he does not want anyone to help him with any treatment.  He maintained fair eye contact.  His speech is rapid and loud and labile and although he denies any auditory or visual hallucinations he seems hypervigilant and guarded and unable to comprehend why he is here.  He was informed that he had a stroke and will need to be treated before discharge.  However he seems to lack insight and understand why he is here.  He continues to demand to leave.  He would not even allow the nurse to do some vital signs. At this point one of the ACT team workers will obtain and the patient recognized him and  claims that he wants to go home with him.  We contacted the ACT team to obtain a list of his medications. 03/16/2024: The patient was seen and reevaluated today.  The chart was reviewed.  The patient remains labile and partially cooperative.  He has refused occupational therapy evaluation and was also seen by the hospitalist and the ACTT team.  With the redirection persuasion patient seems to comply with treatment.  He is back on his home medications which he is taking.  On examination  the patient is alert oriented and initially cooperative but with a further discussion he escalated in his behavior demanding to be discharged today and claiming that he has his own apartment and that he never really had a stroke.  He lacks insight and at his baseline tends to be argumentative and belligerent.  However, he is improved compared to yesterday.  Strongly encouraged him to continue with the medications and use PRNs as indicated.  He would benefit from APS services and probably a guardian given his history of multiple strokes and lack of insight.  Diagnoses:  Active Hospital problems: Principal Problem:   Acute CVA (cerebrovascular accident) [facility]) Active Problems:   Schizoaffective disorder, bipolar type (HCC)   Cocaine abuse (HCC)   Type 2 diabetes mellitus (HCC)   Prolonged QT interval   Acid reflux   History of DVT (deep vein thrombosis)   Status post bilateral BKA.   COPD (chronic obstructive pulmonary disease) (HCC)   Chronic systolic CHF (congestive heart failure) (HCC)   Benign essential HTN   LV (left ventricular) mural thrombus   Elevated liver enzymes   Dilated cardiomyopathy (HCC)    Plan   ## Psychiatric Medication Recommendations:  Abilify was restarted with improvement of his symptoms.  ## Medical Decision Making Capacity:  Patient is his own guardian. Strongly suggest contacting APS services and may be initiating guardianship  ## Further Work-up:  -- None EKG -- most recent EKG on 03/10/2024 had QtC of 483.  However he has been on Abilify for a long time and is EKG from February had a QTc of 489. -- Pertinent labwork reviewed earlier this admission includes: Continue with monitoring QTc intervals.   ## Disposition:-- Plan Post Discharge/Psychiatric Care Follow-up resources we may consider IVC while the patient in the hospital to improve compliance.  Once stabilized patient may be followed up by the ACT team.  ## Behavioral / Environmental: -Difficult  Patient (SELECT OPTIONS FROM BELOW), Recommend using specific terminology regarding PNES, i.e. call the episodes "non-epileptic seizures" rather than "pseudoseizures" as the latter insinuates "fake" or "feigned" symptoms, when the events are a very real experience to the patient and are a physical, non-volitional, manifestation of fear, pain and anxiety. , or Utilize compassion and acknowledge the patient's experiences while setting clear and realistic expectations for care.    ## Safety and Observation Level:  - Based on my clinical evaluation, I estimate the patient to be at low risk of self harm in the current setting. - At this time, we recommend  1:1 Observation. This decision is based on my review of the chart including patient's history and current presentation, interview of the patient, mental status examination, and consideration of suicide risk including evaluating suicidal ideation, plan, intent, suicidal or self-harm behaviors, risk factors, and protective factors. This judgment is based on our ability to directly address suicide risk, implement suicide prevention strategies, and develop a safety plan while the patient is in the clinical setting. Please contact our team  if there is a concern that risk level has changed.  CSSR Risk Category:C-SSRS RISK CATEGORY: No Risk  Suicide Risk Assessment: Patient has following modifiable risk factors for suicide: recklessness and medication noncompliance, which we are addressing by adding medications, using as needed antipsychotics and patient will require a nonemergency forced medication.. Patient has following non-modifiable or demographic risk factors for suicide: male gender, history of suicide attempt, history of self harm behavior, and psychiatric hospitalization Patient has the following protective factors against suicide: Access to outpatient mental health care  Thank you for this consult request. Recommendations have been communicated to the  primary team.  We will continue to follow until stabilized.  At this time.   Buzz Cass, MD       History of Present Illness  Relevant Aspects of [facility] Course:  Admitted on 03/04/2024 for weakness and dizziness as a result of a CVA.Aaron Aas   Patient Report:  Patient states" I want to go home"  Psych ROS:  Depression: Denied Anxiety: Appears anxious Mania (lifetime and current): Past history Psychosis: (lifetime and current): Past history  Collateral information:  Contacted ACTT at [facility] on 03/15/2024.  The ACT team came to visit him and following him while in the hospital.  Review of Systems  Neurological:  Positive for dizziness, sensory change and speech change.  Psychiatric/Behavioral:  Positive for hallucinations and substance abuse. The patient is nervous/anxious.      Psychiatric and Social History  Psychiatric History:  Information collected from records  Prev Dx/Sx: Schizoaffective disorder Current Psych Provider: ACT team Home Meds (current): Abilify 5 mg twice a day and hydroxyzine 25 mg every 6 hours as needed Previous Med Trials: Multiple trials Therapy: Currently followed up ACT team  Prior Psych Hospitalization: Last hospitalization was at [facility] in October 2024 Prior Self Harm: History of suicidal behavior Prior Violence: History of agitation and noncompliance  Family Psych History: Unknown Family Hx suicide: Unknown  Social History:  Developmental Hx: Please see records. Educational Hx: Unknown Occupational Hx: Unknown Legal Hx: None noted Living Situation: Patient has his own apartment Spiritual Hx: Unknown Access to weapons/lethal means: Unknown  Substance History Alcohol: History of alcohol use Type of alcohol unknown Last Drink unknown Number of drinks per day unknown History of alcohol withdrawal seizures unknown History of DT's past history Tobacco: Unknown Illicit drugs: Severe cocaine use disorder Prescription drug  abuse: None Rehab hx: In the past.  Exam Findings  Physical Exam: Please see the hospitalist note for an H&P Vital Signs:  Temp:  [97.6 F (36.4 C)-99.6 F (37.6 C)] 97.6 F (36.4 C) (04/16 0826) Pulse Rate:  [65-75] 65 (04/16 0826) Resp:  [16-20] 20 (04/16 0826) BP: (84-138)/(66-107) 113/78 (04/16 0826) SpO2:  [94 %-100 %] 100 % (04/16 0826) Weight:  [70.1 kg] 70.1 kg (04/16 0500) Blood pressure 113/78, pulse 65, temperature 97.6 F (36.4 C), temperature source Oral, resp. rate 20, height 5\' 9"  (1.753 m), weight 70.1 kg, SpO2 100%. Body mass index is 22.82 kg/m.  Physical Exam HENT:     Head: Normocephalic.  Neurological:     Mental Status: He is alert.     Comments: The patient recently had a stroke.  He appears to have some slurred speech and is very irritable.     Mental Status Exam: General Appearance: Casual and Disheveled  Orientation:  Full (Time, Place, and Person)  Memory:  Recent;   Fair  Concentration:  Concentration: Fair and Attention Span: Poor  Recall:  NA  Attention  Poor  Eye Contact:  Fair  Speech:  Pressured, Slurred, and loud and belligerent  Language:  Fair  Volume:  Increased  Mood: Angry and irritable  Affect:  Labile  Thought Process:  Coherent and Linear  Thought Content:  Illogical  Suicidal Thoughts:  No  Homicidal Thoughts:  No  Judgement:  Poor  Insight:  Shallow  Psychomotor Activity:  Increased  Akathisia:  No  Fund of Knowledge:  Fair      Assets:  Housing Resilience Social Support  Cognition:  WNL and Impaired,  Mild  ADL's:  Impaired  AIMS (if indicated):        Other History   These have been pulled in through the EMR, reviewed, and updated if appropriate.  Family History:  The patient's family history includes Drug abuse in his mother; Suicidality in his mother.  Medical History: Past Medical History:  Diagnosis Date   Asthma    Bipolar 1 disorder (HCC)    CHF (congestive heart failure) (HCC)    COPD (chronic  obstructive pulmonary disease) (HCC)    Depression    Diabetes mellitus without complication (HCC)    GERD (gastroesophageal reflux disease)    Homelessness    Hypertension    QT prolongation    Schizoaffective disorder (HCC)     Surgical History: Past Surgical History:  Procedure Laterality Date   AMPUTATION Left 12/10/2022   Procedure: LEFT BELOW KNEE AMPUTATION;  Surgeon: Nadara Mustard, MD;  Location: [facility] OR;  Service: Orthopedics;  Laterality: Left;   AMPUTATION TOE Bilateral 01/02/2021   Procedure: AMPUTATION TOE;  Surgeon: Park Liter, DPM;  Location: [facility] OR;  Service: Podiatry;  Laterality: Bilateral;  Ok to leave patient on hospital bed   IR ANGIO INTRA EXTRACRAN SEL COM CAROTID INNOMINATE BILAT MOD SED  12/17/2020   IR ANGIO VERTEBRAL SEL VERTEBRAL BILAT MOD SED  12/17/2020   MULTIPLE EXTRACTIONS WITH ALVEOLOPLASTY N/A 03/25/2023   Procedure: IRRIGATION AND DEBRIDEMENT, MULTIPLE EXTRACTION WITH ALVEOLOPLASTY X 4 , EXTRACTIONS 4 THROUGH 13, 20 THROUGHT 29;  Surgeon: Vivia Ewing, DMD;  Location: [facility] OR;  Service: Oral Surgery;  Laterality: N/A;   RADIOLOGY WITH ANESTHESIA N/A 12/17/2020   Procedure: IR WITH ANESTHESIA;  Surgeon: Radiologist, Medication, MD;  Location: [facility] OR;  Service: Radiology;  Laterality: N/A;     Medications:   Current Facility-Administered Medications:    albuterol (PROVENTIL) (2.5 MG/3ML) 0.083% nebulizer solution 3 mL, 3 mL, Nebulization, Q6H PRN, Charlott Holler, MD, 3 mL at 03/14/24 0320   apixaban (ELIQUIS) tablet 5 mg, 5 mg, Oral, BID, Millen, Jessica B, RPH, 5 mg at 03/15/24 2119   arformoterol (BROVANA) nebulizer solution 15 mcg, 15 mcg, Nebulization, BID, Charlott Holler, MD, 15 mcg at 03/15/24 0959   ARIPiprazole (ABILIFY) tablet 5 mg, 5 mg, Oral, Daily, Paliwal, Aditya, MD, 5 mg at 03/15/24 1231   budesonide (PULMICORT) nebulizer solution 0.5 mg, 0.5 mg, Nebulization, BID, Charlott Holler, MD, 0.5 mg at 03/15/24 1000   carvedilol (COREG) tablet  6.25 mg, 6.25 mg, Oral, BID WC, Hilty, Lisette Abu, MD, 6.25 mg at 03/15/24 0949   feeding supplement (ENSURE ENLIVE / ENSURE PLUS) liquid 237 mL, 237 mL, Oral, BID BM, Pahwani, Ravi, MD, 237 mL at 03/15/24 1427   furosemide (LASIX) tablet 40 mg, 40 mg, Oral, Daily, Kress, Chance, [place], 40 mg at 03/15/24 0949   losartan (COZAAR) tablet 50 mg, 50 mg, Oral, Daily, Hilty, Lisette Abu, MD, 50 mg at 03/15/24  4034   nicotine (NICODERM CQ - dosed in mg/24 hours) patch 14 mg, 14 mg, Transdermal, Daily, Desai, Nikita S, MD, 14 mg at 03/13/24 0950   oxyCODONE (Oxy IR/ROXICODONE) immediate release tablet 5 mg, 5 mg, Oral, Q4H PRN, Pokhrel, Laxman, MD, 5 mg at 03/14/24 2105   pantoprazole (PROTONIX) EC tablet 40 mg, 40 mg, Oral, Daily, Reome, Earle J, RPH, 40 mg at 03/15/24 0949   revefenacin (YUPELRI) nebulizer solution 175 mcg, 175 mcg, Nebulization, Daily, Desai, Nikita S, MD, 175 mcg at 03/15/24 1002   rosuvastatin (CRESTOR) tablet 5 mg, 5 mg, Oral, QHS, Desai, Nikita S, MD, 5 mg at 03/15/24 2119   spironolactone (ALDACTONE) tablet 12.5 mg, 12.5 mg, Oral, Daily, Hazle Lites, MD, 12.5 mg at 03/15/24 7425  Allergies: Allergies  Allergen Reactions   Acetaminophen Hives, Nausea And Vomiting, Rash and Anaphylaxis    PT states that if he takes this he is going to the hospital   Motrin [Ibuprofen] Anaphylaxis   Nsaids Anaphylaxis    PT states that if he takes this he is going to the hospital   Lyrica Cr [Pregabalin Er] Other (See Comments)    Dystonia Dysarthria    Peanut-Containing Drug Products Other (See Comments)    Pt denies allergy to peanut butter 03/02/2021    Buzz Cass, MD

## 2024-03-17 DIAGNOSIS — I639 Cerebral infarction, unspecified: Secondary | ICD-10-CM | POA: Diagnosis not present

## 2024-03-17 NOTE — Progress Notes (Signed)
 Occupational Therapy Treatment Patient Details Name: [name] MRN: [id number] DOB: 06-13-1966 Today's Date: 03/17/2024   History of present illness Patient is 58 y.o. male presented to ED on 03/04/24 with stroke like symptoms when caregiver found him with slurred speech and right sided weakness. Head CT reveals Acute left MCA territory infarct affecting the insula, caudate nucleus, lentiform nucleus and internal capsule. PMH significant of  Schizoaffective disorder, heart failure with reduced ejection fraction, pretension, DMII, COPD, s/p bilateral amputations, chronic pain, and polysubstance abuse.   OT comments  Limited session due to patient participation.  Patient was less agitated today and did move some in the bed.  RUE presenting with Active range in all joints which is a significant improvement from the evaluation.  Could only observe as patient would not let me touch him.  He has some weakness in the wrist.   Provided a squeeze ball, and demonstrated use, but patient would not return demonstration.  Will continue as able on acute and recommend continued inpatient follow up therapy, <3 hours/day        If plan is discharge home, recommend the following:  Two people to help with walking and/or transfers;Two people to help with bathing/dressing/bathroom;Assistance with cooking/housework;Direct supervision/assist for medications management;Direct supervision/assist for financial management;Assist for transportation;Help with stairs or ramp for entrance;Supervision due to cognitive status   Equipment Recommendations  None recommended by OT    Recommendations for Other Services      Precautions / Restrictions Precautions Precautions: Fall Recall of Precautions/Restrictions: Impaired Precaution/Restrictions Comments: h/o BLE amputations Restrictions Weight Bearing Restrictions Per Provider Order: No       Mobility Bed Mobility Overal bed mobility: Needs Assistance Bed Mobility:  Rolling Rolling: Modified independent (Device/Increase time), Used rails              Transfers                         Balance                                           ADL either performed or assessed with clinical judgement   ADL                                              Extremity/Trunk Assessment Upper Extremity Assessment Upper Extremity Assessment: Right hand dominant RUE Deficits / Details: Patient is able to use RUE to move in bed and support self.  Refused to bring it through full rom but in observation with functional activities he has active movement in all joints, wrist extension appeared weak.  Provided squeeze ball to work on strength.  Patient took the ball, but refused to return demonstration   Lower Extremity Assessment Lower Extremity Assessment: Defer to PT evaluation        Vision       Perception     Praxis     Communication Communication Communication: Impaired Factors Affecting Communication: Difficulty expressing self;Reduced clarity of speech   Cognition Arousal: Alert Behavior During Therapy: [facility] for tasks assessed/performed (pt. not as agitated today, more angry and resistive to therapy) Cognition: Cognition impaired, Difficult to assess Difficult to assess due to: Impaired communication Orientation impairments: Time, Situation Awareness: Intellectual awareness impaired,  Online awareness impaired                         Following commands: Impaired Following commands impaired: Follows one step commands inconsistently, Follows one step commands with increased time      Cueing   Cueing Techniques: Verbal cues, Tactile cues, Gestural cues  Exercises      Shoulder Instructions       General Comments VSS    Pertinent Vitals/ Pain       Pain Assessment Pain Assessment: No/denies pain Pain Intervention(s): Monitored during session  Home Living                                           Prior Functioning/Environment              Frequency  Min 1X/week        Progress Toward Goals  OT Goals(current goals can now be found in the care plan section)     Acute Rehab OT Goals OT Goal Formulation: Patient unable to participate in goal setting Time For Goal Achievement: 03/20/24 Potential to Achieve Goals: Fair ADL Goals Pt Will Perform Lower Body Bathing: with mod assist;sitting/lateral leans Pt Will Perform Lower Body Dressing: with mod assist;sitting/lateral leans Pt Will Transfer to Toilet: with mod assist;with transfer board;bedside commode Pt Will Perform Toileting - Clothing Manipulation and hygiene: with mod assist;sitting/lateral leans Additional ADL Goal #1: Patient will be able to follow 1-2 step commands consistently in order to progress with education post stroke to promote increased independence. Additional ADL Goal #2: Patient will be able to sit EOB with mod A for 5 minutes as a precursor to OOB activities.  Plan      Co-evaluation                 AM-PAC OT "6 Clicks" Daily Activity     Outcome Measure   Help from another person eating meals?: A Lot Help from another person taking care of personal grooming?: A Lot Help from another person toileting, which includes using toliet, bedpan, or urinal?: A Little Help from another person bathing (including washing, rinsing, drying)?: A Little Help from another person to put on and taking off regular upper body clothing?: A Little Help from another person to put on and taking off regular lower body clothing?: A Little 6 Click Score: 16    End of Session    OT Visit Diagnosis: Unsteadiness on feet (R26.81);Other abnormalities of gait and mobility (R26.89);Muscle weakness (generalized) (M62.81);Other symptoms and signs involving the nervous system (R29.898);Other symptoms and signs involving cognitive function;Hemiplegia and hemiparesis Hemiplegia -  Right/Left: Right Hemiplegia - dominant/non-dominant: Dominant Hemiplegia - caused by: Cerebral infarction   Activity Tolerance Other (comment)   Patient Left in bed;with call bell/phone within reach;with bed alarm set   Nurse Communication Mobility status        Time: 4098-1191 OT Time Calculation (min): 14 min  Charges: OT General Charges $OT Visit: 1 Visit OT Treatments $Therapeutic Activity: 8-22 mins  Chantal Comment OTR/L   Clementina Cutter 03/17/2024, 11:26 AM

## 2024-03-17 NOTE — Progress Notes (Addendum)
 SLP Cancellation Note  Patient Details Name: [name] MRN: [id number] DOB: 1966-08-28   Cancelled treatment:        Goal was to observe with thin liquids after upgrade and work on dysarthria. Pt adamantly refused to work with pt. SLP explained rationale and he repeatedly yelled to "leave me alone". He will sometimes drink Coke but refusing. He is still dysarthric (dysarthria also noted in 2023). His language appeared functional and able to express his frustrations clearly to SLP. Will continue efforts.    [name] 03/17/2024, 8:21 AM

## 2024-03-17 NOTE — TOC Progression Note (Signed)
 Transition of Care [facility]) - Progression Note    Patient Details  Name: [name] MRN: [id number] Date of Birth: 1966/08/18  Transition of Care [facility]) CM/SW Contact  Tandy Fam, [name] Phone Number: 03/17/2024, 8:04 PM  Clinical Narrative:   CSW contacted APS to provide report and discuss need for guardianship. Awaiting response on report.    Expected Discharge Plan: Skilled Nursing Facility Barriers to Discharge: Continued Medical Work up, English as a second language teacher, Active Substance Use - Placement, Inadequate or no insurance, SNF Pending payor source - LOG, SNF Pending Medicaid, SNF Pending bed offer  Expected Discharge Plan and Services       Living arrangements for the past 2 months: Apartment Expected Discharge Date: 03/14/24                                     Social Determinants of Health (SDOH) Interventions SDOH Screenings   Food Insecurity: Food Insecurity Present (03/06/2024)  Housing: High Risk (03/06/2024)  Transportation Needs: No Transportation Needs (03/06/2024)  Utilities: Patient Declined (03/06/2024)  Alcohol Screen: Medium Risk (06/24/2019)  Depression (PHQ2-9): Low Risk  (02/18/2024)  Recent Concern: Depression (PHQ2-9) - Medium Risk (01/21/2024)  Financial Resource Strain: High Risk (07/24/2021)   Received from [facility], [facility]  Physical Activity: Inactive (02/18/2024)  Social Connections: Unknown (04/05/2022)   Received from [facility], [facility]  Stress: Stress Concern Present (07/24/2021)   Received from [facility], [facility]  Tobacco Use: High Risk (03/04/2024)  Health Literacy: Adequate Health Literacy (02/18/2024)    Readmission Risk Interventions    12/21/2023    9:25 AM 12/09/2022    9:02 AM 12/01/2022    3:56 PM  Readmission Risk Prevention Plan  Transportation Screening Complete Complete Complete  HRI or Home Care Consult Complete    Social Work Consult for Recovery Care Planning/Counseling Complete     Palliative Care Screening Not Applicable    Medication Review Oceanographer) Complete Complete Complete  PCP or Specialist appointment within 3-5 days of discharge  Complete Complete  HRI or Home Care Consult  Complete Complete  SW Recovery Care/Counseling Consult  Complete Complete  Palliative Care Screening  Not Applicable Not Applicable  Skilled Nursing Facility  Not Applicable Not Applicable  SNF Comments   n/a

## 2024-03-17 NOTE — Progress Notes (Signed)
 PROGRESS NOTE  [name]  ZOX:[id number] DOB: Dec 25, 1965 DOA: 03/04/2024 PCP: Georganna Skeans, MD  Consultants  Brief Narrative: 58 year old male with past medical history of cardiomyopathy, active cocaine abuse presented to the hospital with slurred speech and confusion with right sided weakness.  Code stroke was activated and was brought into the hospital.  CT head showed left MCA infarct, CTA head and neck showed M2 occlusion on the left.  2D echocardiogram showed large mobile LV thrombus, LA thrombus and RV apical thrombus with EF of 20%.  Neurology and cardiology were consulted and due to high risk of emboli decision was made to anticoagulate and put the patient in the ICU.  LV thrombus more than 3 cm in the size.  Subsequently, patient was transferred out of the ICU under [name] care again on 03/07/2024.  SNF was attempted but question arose of patient's capacity and psychiatry consulted on 03/15/2024.  Deemed to not have capacity and TOC now working on next steps in care.     Assessment and Plan: Agitation/lack of capacity: - Now main issue.  Greatly appreciate psychiatry input. -Patient meets criteria for IVC based on lack of insight and aggressive behavior.  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 -Home Abilify restarted -- but he has refused this past 2 days.  Still intermittently refusing cares.  -We will watch to see how much patient refuses medications because may need to be IVC need for emergency forced medication as per psych recommendations. - Question for psych -- would long-acting abilify injection be more helpful? -Greatly complicates discharge planning   Acute left MCA CVA - reason for admission - On presentation, CT head noting left MCA infarct with M2 occlusion.  MRI showed left basal ganglia and insula punctate focal ischemia.  Still has some residual facial droop.  Palliative care consulted as well however not receptive to their  discussion. - Has residual right sided weakness and dysarthria, which has been present even prior to this admission based on notes - no changes.  LV/LA/RV thrombus and RLE DVT - Noted on echo, showing multiple clot attached to the septum along with small RV clot as well.  Was initiated on anticoagulation initially transition to ICU.  Since transitioned out of ICU.  Continues on anticoagulation.  Hypercoagulable workup noting mildly low Antithrombin activity, low protein C activity.  - Cardiology following previously.  They have signed off.  Appreciate input. -Continue Eliquis   Acute on chronic hypoxic respiratory failure - Resolved. -  Likely secondary to acute HFrEF exacerbation.  Diuretics on board.  Patient states he has O2 at home as well as concentrator.     Acute on chronic HFrEF - EF on presentation noting severely reduced function, global hypokinesis, EF less than 20%.  Currently on spironolactone, Coreg, losartan.   P.o. Lasix 40 mg daily.  Urine output excellent.   CAD - Not a candidate for invasive workup given extensive substance abuse appliance.   Hypertension/hyperlipidemia - Medication regimen above.   Diabetes mellitus -Insulin sliding scale on board.   Polysubstance abuse - Drug screen positive for cocaine, opiates, benzos.  Encourage cessation.   Goals of care - still adamant he is fine and nothing is wrong, did not have a stroke. -See psych consultation for further details, restarted home Abilify but he's refusing this.  Will likely be admitted for some time as we await his psychiatric condition to stabilize - we've started APS/guardianship paperwork  DVT prophylaxis:  apixaban (ELIQUIS) tablet 5 mg  Code Status:   Code Status: Full Code Level of care: Telemetry Medical Status is: Inpatient Dispo: Unclear.  Unable to be discharged due to lacking capacity   Consults called: Cardiology, psychiatry  Subjective: Less angry today than he has been, but  ends conversations when he is told he can't go home.    Objective: Vitals:   03/16/24 2100 03/17/24 0500 03/17/24 0737 03/17/24 1611  BP: 118/76  118/71 108/84  Pulse: 85  70 79  Resp: 18  18 18   Temp: 97.6 F (36.4 C)  98.2 F (36.8 C) 98.3 F (36.8 C)  TempSrc: Oral  Oral Oral  SpO2: 96%  96% 92%  Weight:  63.4 kg    Height:       No intake or output data in the 24 hours ending 03/17/24 1617  Filed Weights   03/14/24 0500 03/16/24 0500 03/17/24 0500  Weight: 76.3 kg 70.1 kg 63.4 kg   Body mass index is 20.64 kg/m.  Gen: 58 y.o. male in no apparent distress.  Nontoxic.  Lying in bed on side, awake on my exam. Pulm: Non-labored breathing.  Clear to auscultation bilaterally.  CV: Regular rate and rhythm. No murmur, rub, or gallop. No JVD Ext: Warm, no deformities Skin: No rashes, lesions Neuro: Alert and oriented to person.  Right upper extremity weakness appreciated.  Garbled speech that is somewhat difficult to understand. Psych: Agitated, but less so today than prior days.   I have personally reviewed the following labs and images: CBC: Recent Labs  Lab 03/14/24 0912 03/15/24 1351  WBC 7.7 6.1  HGB 14.5 15.9  HCT 43.9 47.1  MCV 92.2 91.5  PLT 281 304   BMP &GFR Recent Labs  Lab 03/14/24 0912  NA 138  K 4.1  CL 102  CO2 26  GLUCOSE 103*  BUN 19  CREATININE 0.95  CALCIUM 9.1  MG 1.9   Estimated Creatinine Clearance: 78.8 mL/min (by C-G formula based on SCr of 0.95 mg/dL). Liver & Pancreas: No results for input(s): "AST", "ALT", "ALKPHOS", "BILITOT", "PROT", "ALBUMIN" in the last 168 hours.  No results for input(s): "LIPASE", "AMYLASE" in the last 168 hours. No results for input(s): "AMMONIA" in the last 168 hours. Diabetic: No results for input(s): "HGBA1C" in the last 72 hours. Recent Labs  Lab 03/13/24 1209 03/13/24 1646 03/14/24 0804 03/14/24 1147 03/14/24 2032  GLUCAP 100* 169* 112* 124* 123*   Cardiac Enzymes: No results for  input(s): "CKTOTAL", "CKMB", "CKMBINDEX", "TROPONINI" in the last 168 hours. No results for input(s): "PROBNP" in the last 8760 hours. Coagulation Profile: No results for input(s): "INR", "PROTIME" in the last 168 hours. Thyroid Function Tests: No results for input(s): "TSH", "T4TOTAL", "FREET4", "T3FREE", "THYROIDAB" in the last 72 hours. Lipid Profile: No results for input(s): "CHOL", "HDL", "LDLCALC", "TRIG", "CHOLHDL", "LDLDIRECT" in the last 72 hours. Anemia Panel: No results for input(s): "VITAMINB12", "FOLATE", "FERRITIN", "TIBC", "IRON", "RETICCTPCT" in the last 72 hours. Urine analysis:    Component Value Date/Time   COLORURINE AMBER (A) 03/05/2024 1122   APPEARANCEUR HAZY (A) 03/05/2024 1122   LABSPEC >1.046 (H) 03/05/2024 1122   PHURINE 5.0 03/05/2024 1122   GLUCOSEU NEGATIVE 03/05/2024 1122   HGBUR NEGATIVE 03/05/2024 1122   BILIRUBINUR NEGATIVE 03/05/2024 1122   KETONESUR 5 (A) 03/05/2024 1122   PROTEINUR 30 (A) 03/05/2024 1122   NITRITE NEGATIVE 03/05/2024 1122   LEUKOCYTESUR NEGATIVE 03/05/2024 1122   Sepsis Labs: Invalid input(s): "PROCALCITONIN", "  LACTICIDVEN"  Microbiology: No results found for this or any previous visit (from the past 240 hours).   Radiology Studies: No results found.  Scheduled Meds:  apixaban  5 mg Oral BID   arformoterol  15 mcg Nebulization BID   ARIPiprazole  5 mg Oral Daily   budesonide (PULMICORT) nebulizer solution  0.5 mg Nebulization BID   carvedilol  6.25 mg Oral BID WC   feeding supplement  237 mL Oral BID BM   furosemide  40 mg Oral Daily   losartan  50 mg Oral Daily   nicotine  14 mg Transdermal Daily   pantoprazole  40 mg Oral Daily   revefenacin  175 mcg Nebulization Daily   rosuvastatin  5 mg Oral QHS   spironolactone  12.5 mg Oral Daily   Continuous Infusions:   LOS: 13 days   35 minutes with more than 50% spent in reviewing records, counseling patient/family and coordinating care.  Trenton Frock,  MD [facility] [email or web address] 03/17/2024, 4:17 PM

## 2024-03-18 DIAGNOSIS — F250 Schizoaffective disorder, bipolar type: Secondary | ICD-10-CM | POA: Diagnosis not present

## 2024-03-18 DIAGNOSIS — I639 Cerebral infarction, unspecified: Secondary | ICD-10-CM | POA: Diagnosis not present

## 2024-03-18 MED ORDER — GABAPENTIN 100 MG PO CAPS
200.0000 mg | ORAL_CAPSULE | Freq: Two times a day (BID) | ORAL | Status: DC
  Administered 2024-03-18 – 2024-08-17 (×278): 200 mg via ORAL
  Filled 2024-03-18 (×298): qty 2

## 2024-03-18 MED ORDER — LURASIDONE HCL 40 MG PO TABS
40.0000 mg | ORAL_TABLET | Freq: Every day | ORAL | Status: DC
  Administered 2024-03-23 – 2024-03-24 (×2): 40 mg via ORAL
  Filled 2024-03-18 (×19): qty 1

## 2024-03-18 NOTE — Consult Note (Signed)
 [facility] Psychiatric Consult Follow up  Patient Name: .[name]  MRN: [id number]  DOB: 1966-10-07  Consult Order details:  Orders (From admission, onward)     Start     Ordered   03/14/24 1515  IP CONSULT TO PSYCHIATRY       Ordering Provider: Trenton Frock, MD  Provider:  (Not yet assigned)  Question Answer Comment  Location [facility]   Reason for Consult? Capacity evaluation -- patient verbally abusive, wants to go home, but oriented only to self.  Lives alone, seems to be unable to care for himself at home.  Essentially refusing all cares here      03/14/24 1514             Mode of Visit: In person    Psychiatry Consult Evaluation  Service Date: March 18, 2024 LOS:  LOS: 14 days  Chief Complaint 58 year old male with a history of cardiomyopathy, cocaine abuse who presented with slurred speech and confusion and right-sided weakness due to a left MCA infarct.  Consult was requested because of patient's agitation, verbally abusive and refusing care.  Primary Psychiatric Diagnoses  Schizoaffective disorder, bipolar type by history. 2.  Substance use disorder, cocaine use disorder 3.  Acute agitation with limited cognitive abilities due to multiple strokes  Assessment  [name] is a 58 y.o. male admitted: Medicallyfor 03/04/2024  2:31 PM for weakness and dizziness due to a stroke. He carries the psychiatric diagnoses of schizoaffective disorder and cocaine use disorder, severe.  And has a past medical history of asthma, COPD, hypertension, recent CVA..    03/15/2024: His current presentation of agitation is most consistent with schizoaffective disorder and cocaine use disorder.Aaron Aas He meets criteria for IVC based on lack of insight and aggressive behavior..  Current outpatient psychotropic medications include Abilify  and historically he has had a positive response to these medications. He was noncompliant compliant with medications prior to  admission as evidenced by aggressive behavior and agitation. On initial examination, patient was belligerent, labile and refusing all medications.. Please see plan below for detailed recommendations.  When seen today the patient is very agitated, labile demanding to go home and refusing care and medical treatment.  His speech is slurred and is somewhat redirectable for brief periods of time but would not allow vital signs or take medications.  The plan is to initiate an IVC and consider nonemergency forced medications if appropriate.  We will continue to encourage him to cooperate since he has an ACTT team that he is quite involved with his care.  The patient was seen in his room and was noted to be laying in bed.  He is a bilateral amputee of both the legs.  He is awake and alert but extremely loud and belligerent blaming that he wants to go home and he does not want anyone to help him with any treatment.  He maintained fair eye contact.  His speech is rapid and loud and labile and although he denies any auditory or visual hallucinations he seems hypervigilant and guarded and unable to comprehend why he is here.  He was informed that he had a stroke and will need to be treated before discharge.  However he seems to lack insight and understand why he is here.  He continues to demand to leave.  He would not even allow the nurse to do some vital signs. At this point one of the ACT team workers will obtain and the patient recognized him  and claims that he wants to go home with him.  We contacted the ACT team to obtain a list of his medications.  03/16/2024: The patient was seen and reevaluated today.  The chart was reviewed.  The patient remains labile and partially cooperative.  He has refused occupational therapy evaluation and was also seen by the hospitalist and the ACTT team.  With the redirection persuasion patient seems to comply with treatment.  He is back on his home medications which he is taking.  On  examination the patient is alert oriented and initially cooperative but with a further discussion he escalated in his behavior demanding to be discharged today and claiming that he has his own apartment and that he never really had a stroke.  He lacks insight and at his baseline tends to be argumentative and belligerent.  However, he is improved compared to yesterday.  Strongly encouraged him to continue with the medications and use PRNs as indicated.  He would benefit from APS services and probably a guardian given his history of multiple strokes and lack of insight.  03/18/2024: The client was watching television prior to the assessment.  He stated, "I'm fine".  When asked about his refusal to take Ability, he said, "I don't want to.  If I don't take it, I don't take it."  The sitter had no issues with behaviors today.  He requested ice cream and graham crackers which she went to retrieve.  Discussed other medications since he does not want the Abilify  and he agreed to Latuda  and gabapentin , started with Latuda  40 mg daily, discontinued the Abilify  and began gabapentin  200 mg BID to assist with his mood.  Diagnoses:  Active Hospital problems: Principal Problem:   Acute CVA (cerebrovascular accident) [facility] Active Problems:   Schizoaffective disorder, bipolar type (HCC)   Cocaine abuse (HCC)   Type 2 diabetes mellitus (HCC)   Prolonged QT interval   Acid reflux   History of DVT (deep vein thrombosis)   Status post bilateral BKA.   COPD (chronic obstructive pulmonary disease) (HCC)   Chronic systolic CHF (congestive heart failure) (HCC)   Benign essential HTN   LV (left ventricular) mural thrombus   Elevated liver enzymes   Dilated cardiomyopathy (HCC)    Plan   ## Psychiatric Medication Recommendations:  Abilify  discontinued as the patient continues to refuse it Started Latuda  40 mg daily Started gabapentin  200 mg BID  ## Medical Decision Making Capacity:  Patient is his own  guardian. Strongly suggest contacting APS services and may be initiating guardianship  ## Further Work-up:  -- None EKG -- most recent EKG on 03/10/2024 had QtC of 483.  However he has been on Abilify  for a long time and is EKG from February had a QTc of 489. -- Pertinent labwork reviewed earlier this admission includes: Continue with monitoring QTc intervals.   ## Disposition:-- Follow up with ACT team  ## Behavioral / Environmental: -Difficult Patient (SELECT OPTIONS FROM BELOW), Recommend using specific terminology regarding PNES, i.e. call the episodes "non-epileptic seizures" rather than "pseudoseizures" as the latter insinuates "fake" or "feigned" symptoms, when the events are a very real experience to the patient and are a physical, non-volitional, manifestation of fear, pain and anxiety. , or Utilize compassion and acknowledge the patient's experiences while setting clear and realistic expectations for care.    ## Safety and Observation Level:  - Based on my clinical evaluation, I estimate the patient to be at low risk of self harm in  the current setting. - At this time, we recommend  1:1 Observation. This decision is based on my review of the chart including patient's history and current presentation, interview of the patient, mental status examination, and consideration of suicide risk including evaluating suicidal ideation, plan, intent, suicidal or self-harm behaviors, risk factors, and protective factors. This judgment is based on our ability to directly address suicide risk, implement suicide prevention strategies, and develop a safety plan while the patient is in the clinical setting. Please contact our team if there is a concern that risk level has changed.  CSSR Risk Category:C-SSRS RISK CATEGORY: No Risk  Suicide Risk Assessment: Patient has following modifiable risk factors for suicide: recklessness and medication noncompliance, which we are addressing by adding medications,  using as needed antipsychotics and patient will require a nonemergency forced medication.. Patient has following non-modifiable or demographic risk factors for suicide: male gender, history of suicide attempt, history of self harm behavior, and psychiatric hospitalization Patient has the following protective factors against suicide: Access to outpatient mental health care  Thank you for this consult request. Recommendations have been communicated to the primary team.  We will continue to follow until stabilized.  At this time.   Roslynn Coombes, NP       History of Present Illness  Relevant Aspects of [facility] Course:  Admitted on 03/04/2024 for weakness and dizziness as a result of a CVA.Aaron Aas   Patient Report:  Patient states" I want to go home"  Psych ROS:  Depression: Denied Anxiety: Appears anxious Mania (lifetime and current): Past history Psychosis: (lifetime and current): Past history  Collateral information:  Contacted ACTT at [facility] on 03/15/2024.  The ACT team came to visit him and following him while in the hospital.  Review of Systems  Psychiatric/Behavioral:  Positive for substance abuse.      Psychiatric and Social History  Psychiatric History:  Information collected from records  Prev Dx/Sx: Schizoaffective disorder Current Psych Provider: ACT team Home Meds (current): Abilify  5 mg twice a day and hydroxyzine  25 mg every 6 hours as needed Previous Med Trials: Multiple trials Therapy: Currently followed up ACT team  Prior Psych Hospitalization: Last hospitalization was at [facility] in October 2024 Prior Self Harm: History of suicidal behavior Prior Violence: History of agitation and noncompliance  Family Psych History: Unknown Family Hx suicide: Unknown  Social History:  Developmental Hx: Please see records. Educational Hx: Unknown Occupational Hx: Unknown Legal Hx: None noted Living Situation: Patient has his own apartment Spiritual Hx:  Unknown Access to weapons/lethal means: Unknown  Substance History Alcohol: History of alcohol use Type of alcohol unknown Last Drink unknown Number of drinks per day unknown History of alcohol withdrawal seizures unknown History of DT's past history Tobacco: Unknown Illicit drugs: Severe cocaine use disorder Prescription drug abuse: None Rehab hx: In the past.  Exam Findings  Physical Exam: Please see the hospitalist note for an H&P Vital Signs:  Temp:  [98.1 F (36.7 C)-98.3 F (36.8 C)] 98.1 F (36.7 C) (04/18 0719) Pulse Rate:  [76-79] 76 (04/18 0719) Resp:  [18] 18 (04/18 0719) BP: (108-139)/(84-111) 139/111 (04/18 0719) SpO2:  [92 %-95 %] 95 % (04/18 0719) Blood pressure (!) 139/111, pulse 76, temperature 98.1 F (36.7 C), temperature source Oral, resp. rate 18, height 5\' 9"  (1.753 m), weight 63.4 kg, SpO2 95%. Body mass index is 20.64 kg/m.  Physical Exam HENT:     Head: Normocephalic.  Neurological:     Mental Status: He is  alert.     Comments: The patient recently had a stroke.  He appears to have some slurred speech and is very irritable.     Mental Status Exam: General Appearance: Casual  Orientation:  Full (Time, Place, and Person)  Memory:  Recent;   Fair  Concentration:  FAir  Recall:  FAir  Attention  FAir  Eye Contact:  Good  Speech:  WDL  Language:  FAir  Volume:  WDL  Mood: denied depression, cooperative  Affect:  Labile  Thought Process:  Coherent and Linear  Thought Content:  goal directed  Suicidal Thoughts:  No  Homicidal Thoughts:  No  Judgement:  Fair to poor  Insight:  Shallow  Psychomotor Activity:  WDL  Akathisia:  No  Fund of Knowledge:  Fair      Assets:  Housing Resilience Social Support  Cognition:  WNL and Impaired,  Mild  ADL's:  Impaired  AIMS (if indicated):        Other History   These have been pulled in through the EMR, reviewed, and updated if appropriate.  Family History:  The patient's family history  includes Drug abuse in his mother; Suicidality in his mother.  Medical History: Past Medical History:  Diagnosis Date   Asthma    Bipolar 1 disorder (HCC)    CHF (congestive heart failure) (HCC)    COPD (chronic obstructive pulmonary disease) (HCC)    Depression    Diabetes mellitus without complication (HCC)    GERD (gastroesophageal reflux disease)    Homelessness    Hypertension    QT prolongation    Schizoaffective disorder (HCC)     Surgical History: Past Surgical History:  Procedure Laterality Date   AMPUTATION Left 12/10/2022   Procedure: LEFT BELOW KNEE AMPUTATION;  Surgeon: Timothy Ford, MD;  Location: [facility] OR;  Service: Orthopedics;  Laterality: Left;   AMPUTATION TOE Bilateral 01/02/2021   Procedure: AMPUTATION TOE;  Surgeon: Camilo Cella, DPM;  Location: [facility] OR;  Service: Podiatry;  Laterality: Bilateral;  Ok to leave patient on hospital bed   IR ANGIO INTRA EXTRACRAN SEL COM CAROTID INNOMINATE BILAT MOD SED  12/17/2020   IR ANGIO VERTEBRAL SEL VERTEBRAL BILAT MOD SED  12/17/2020   MULTIPLE EXTRACTIONS WITH ALVEOLOPLASTY N/A 03/25/2023   Procedure: IRRIGATION AND DEBRIDEMENT, MULTIPLE EXTRACTION WITH ALVEOLOPLASTY X 4 , EXTRACTIONS 4 THROUGH 13, 20 THROUGHT 29;  Surgeon: Joseph Nickel, DMD;  Location: [facility] OR;  Service: Oral Surgery;  Laterality: N/A;   RADIOLOGY WITH ANESTHESIA N/A 12/17/2020   Procedure: IR WITH ANESTHESIA;  Surgeon: Radiologist, Medication, MD;  Location: [facility] OR;  Service: Radiology;  Laterality: N/A;     Medications:   Current Facility-Administered Medications:    albuterol  (PROVENTIL ) (2.5 MG/3ML) 0.083% nebulizer solution 3 mL, 3 mL, Nebulization, Q6H PRN, Desai, Nikita S, MD, 3 mL at 03/14/24 0320   apixaban  (ELIQUIS ) tablet 5 mg, 5 mg, Oral, BID, Millen, Jessica B, RPH, 5 mg at 03/18/24 0909   arformoterol  (BROVANA ) nebulizer solution 15 mcg, 15 mcg, Nebulization, BID, Desai, Nikita S, MD, 15 mcg at 03/18/24 1610   budesonide  (PULMICORT ) nebulizer  solution 0.5 mg, 0.5 mg, Nebulization, BID, Desai, Nikita S, MD, 0.5 mg at 03/18/24 0746   carvedilol  (COREG ) tablet 6.25 mg, 6.25 mg, Oral, BID WC, Hilty, Aviva Lemmings, MD, 6.25 mg at 03/18/24 0909   feeding supplement (ENSURE ENLIVE / ENSURE PLUS) liquid 237 mL, 237 mL, Oral, BID BM, Modena Andes, MD, 237 mL at 03/18/24 [phone]  furosemide  (LASIX ) tablet 40 mg, 40 mg, Oral, Daily, Berwyn, Hao, [place], 40 mg at 03/18/24 1610   gabapentin  (NEURONTIN ) capsule 200 mg, 200 mg, Oral, BID, Robina Hamor Y, NP   losartan  (COZAAR ) tablet 50 mg, 50 mg, Oral, Daily, Hilty, Aviva Lemmings, MD, 50 mg at 03/16/24 1420   [START ON 03/19/2024] lurasidone  (LATUDA ) tablet 40 mg, 40 mg, Oral, Q breakfast, Eliska Hamil Y, NP   nicotine  (NICODERM CQ  - dosed in mg/24 hours) patch 14 mg, 14 mg, Transdermal, Daily, Desai, Nikita S, MD, 14 mg at 03/13/24 0950   oxyCODONE  (Oxy IR/ROXICODONE ) immediate release tablet 5 mg, 5 mg, Oral, Q4H PRN, Pokhrel, Laxman, MD, 5 mg at 03/16/24 1421   pantoprazole  (PROTONIX ) EC tablet 40 mg, 40 mg, Oral, Daily, Reome, Earle J, RPH, 40 mg at 03/16/24 1425   revefenacin  (YUPELRI ) nebulizer solution 175 mcg, 175 mcg, Nebulization, Daily, Desai, Nikita S, MD, 175 mcg at 03/18/24 0745   rosuvastatin  (CRESTOR ) tablet 5 mg, 5 mg, Oral, QHS, Desai, Nikita S, MD, 5 mg at 03/17/24 2233   spironolactone  (ALDACTONE ) tablet 12.5 mg, 12.5 mg, Oral, Daily, Hazle Lites, MD, 12.5 mg at 03/18/24 9604  Allergies: Allergies  Allergen Reactions   Acetaminophen  Hives, Nausea And Vomiting, Rash and Anaphylaxis    PT states that if he takes this he is going to the hospital   Motrin  [Ibuprofen ] Anaphylaxis   Nsaids Anaphylaxis    PT states that if he takes this he is going to the hospital   Lyrica  Cr [Pregabalin  Er] Other (See Comments)    Dystonia Dysarthria    Peanut-Containing Drug Products Other (See Comments)    Pt denies allergy to peanut butter 03/02/2021    Roslynn Coombes, NP

## 2024-03-18 NOTE — Progress Notes (Signed)
 SLP Cancellation Note  Patient Details Name: [name] MRN: [id number] DOB: [birth year]   Cancelled treatment:        Attempted to see earlier today and pt was getting a bath with the tech. Will continue efforts.    [name] 03/18/2024, 3:47 PM

## 2024-03-18 NOTE — Progress Notes (Signed)
 PT Cancellation Note  Patient Details Name: [name] MRN: [id number] DOB: August 05, 1966   Cancelled Treatment:    Reason Eval/Treat Not Completed: Patient declined, no reason specified (Pt sleeping upon entry into room and declined therapy stating that "he doesnt feel like it today". PT will follow up on Monday)   Adolpho Meenach 03/18/2024, 3:52 PM

## 2024-03-18 NOTE — Progress Notes (Signed)
 Attempted to do an assessment on patient, patient refused and stated " hell no". Only agreed to take his night pills and refuse any further interaction with this nurse.

## 2024-03-18 NOTE — Progress Notes (Signed)
 [name] Squibb, MA from the [facility] Department of Health and [employer] [facility] Adult Services social worker came to see patient today. Pt screamed at him. [name]'s phone is [phone], fax [phone], email is [email or web address] and the after hours emergency number is [phone].

## 2024-03-18 NOTE — Progress Notes (Signed)
 Progress Note   Patient: [name] NWG:[id number] DOB: 1966-10-13 DOA: 03/04/2024     14 DOS: the patient was seen and examined on 03/18/2024  Brief Narrative: 58 year old male with past medical history of cardiomyopathy, active cocaine abuse presented to the hospital with slurred speech and confusion with right sided weakness.  Code stroke was activated and was brought into the hospital.  CT head showed left MCA infarct, CTA head and neck showed M2 occlusion on the left.  2D echocardiogram showed large mobile LV thrombus, LA thrombus and RV apical thrombus with EF of 20%.  Neurology and cardiology were consulted and due to high risk of emboli decision was made to anticoagulate and put the patient in the ICU.  LV thrombus more than 3 cm in the size.  Subsequently, patient was transferred out of the ICU under [facility] care again on 03/07/2024.  SNF was attempted but question arose of patient's capacity and psychiatry consulted on 03/15/2024.  Deemed to not have capacity and TOC now working on next steps in care.     Assessment and Plan: Agitation/lack of capacity: - Now main issue.  Greatly appreciate psychiatry input. -Patient meets criteria for IVC based on lack of insight and aggressive behavior.  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 -Home Abilify  restarted -- but he has refused this past 2 days.  Still intermittently refusing cares.  Psychiatry saw patient today and case was discussed Patient has been started on Latuda  40 mg daily as well as gabapentin  200 mg twice daily   Acute left MCA CVA - reason for admission - On presentation, CT head noting left MCA infarct with M2 occlusion.  MRI showed left basal ganglia and insula punctate focal ischemia. Palliative care consulted as well however not receptive to their discussion. - Has residual right sided weakness and dysarthria, which has been present even prior to this admission based on notes No new  neurological changes   LV/LA/RV thrombus and RLE DVT - Noted on echo, showing multiple clot attached to the septum along with small RV clot as well.  Was initiated on anticoagulation initially transition to ICU.  Since transitioned out of ICU.  Continues on anticoagulation.  Hypercoagulable workup noting mildly low Antithrombin activity, low protein C activity.  - Cardiology following previously.  They have signed off. Continue Eliquis    Acute on chronic hypoxic respiratory failure - Resolved. -  Likely secondary to acute HFrEF exacerbation.  Diuretics on board.  Patient states he has O2 at home as well as concentrator.     Acute on chronic HFrEF - EF on presentation noting severely reduced function, global hypokinesis, EF less than 20%.   Continue on spironolactone , Coreg , losartan .   P.o. Lasix  40 mg daily.  Monitor daily weight as well as input and outputs   CAD - Not a candidate for invasive workup given extensive substance abuse appliance.   Hypertension/hyperlipidemia - Medication regimen above.   Diabetes mellitus -Insulin  sliding scale on board.   Polysubstance abuse - Drug screen positive for cocaine, opiates, benzos.  Encourage cessation.    DVT prophylaxis:  apixaban  (ELIQUIS ) tablet 5 mg  Code Status:   Code Status: Full Code Level of care: Telemetry Medical Status is: Inpatient Dispo: Unclear.  Unable to be discharged due to lacking capacity     Consults called: Cardiology, psychiatry   Subjective: Patient appeared angry this morning on rounds He is not interested in me talking to him  about the need to take his medications Psychiatry have seen patient and have adjusted his medications  Gen: 58 y.o. male in no apparent distress.  Nontoxic.  Lying in bed on side, awake on my exam. Pulm: Non-labored breathing.  Clear to auscultation bilaterally.  CV: Regular rate and rhythm. No murmur, rub, or gallop. No JVD Ext: Warm, no deformities Skin: No rashes,  lesions Neuro: Alert and oriented to person.  Right upper extremity weakness appreciated.  Garbled speech that is somewhat difficult to understand. Psych: Agitated, but less so today than prior days.   Vitals:   03/17/24 0500 03/17/24 0737 03/17/24 1611 03/18/24 0719  BP:  118/71 108/84 (!) 139/111  Pulse:  70 79 76  Resp:  18 18 18   Temp:  98.2 F (36.8 C) 98.3 F (36.8 C) 98.1 F (36.7 C)  TempSrc:  Oral Oral Oral  SpO2:  96% 92% 95%  Weight: 63.4 kg     Height:        Data Reviewed:    Latest Ref Rng & Units 03/15/2024    1:51 PM 03/14/2024    9:12 AM 03/10/2024    6:01 AM  CBC  WBC 4.0 - 10.5 K/uL 6.1  7.7  6.4   Hemoglobin 13.0 - 17.0 g/dL 16.1  09.6  04.5   Hematocrit 39.0 - 52.0 % 47.1  43.9  45.1   Platelets 150 - 400 K/uL 304  281  223        Latest Ref Rng & Units 03/14/2024    9:12 AM 03/10/2024    6:01 AM 03/07/2024   10:09 AM  BMP  Glucose 70 - 99 mg/dL 409  811  914   BUN 6 - 20 mg/dL 19  14  15    Creatinine 0.61 - 1.24 mg/dL 7.82  9.56  2.13   Sodium 135 - 145 mmol/L 138  138  140   Potassium 3.5 - 5.1 mmol/L 4.1  4.6  4.0   Chloride 98 - 111 mmol/L 102  102  109   CO2 22 - 32 mmol/L 26  23  22    Calcium  8.9 - 10.3 mg/dL 9.1  9.1  8.6      Disposition: TOC working on a safe discharge  Time spent: 40 minutes  Author: Ezzard Holms, MD 03/18/2024 1:17 PM  For on call review [email or web address].

## 2024-03-19 DIAGNOSIS — I639 Cerebral infarction, unspecified: Secondary | ICD-10-CM | POA: Diagnosis not present

## 2024-03-19 NOTE — Progress Notes (Signed)
 RN tried to get vital patient [name] refused vitals.

## 2024-03-19 NOTE — Progress Notes (Signed)
 PROGRESS NOTE  [name]  WUJ:[id number] DOB: November 12, 1966 DOA: 03/04/2024 PCP: Abraham Abo, MD   Brief Narrative: Patient is a 57-year male with history of cardiomyopathy, current cocaine abuse who presented with complaint of slurred speech, confusion, right-sided weakness.  Code stroke was called.  CT head showed left MCA infarct, CTA head and neck showed M2 occlusion on the left.  Patient was admitted for stroke workup.  Neurology consulted.  Echo showed large mobile left ventricular thrombus, left atrial thrombus, right ventricular apical thrombus with EF of 20%.  Cardiology involved.  Left ventricular thrombus was more than 3 cm in size.  Patient transferred out of ICU on 03/07/2024 to [facility] service.  Stroke workup completed.  PT/OT, SNF on discharge but he wants to go home.He has poor support, his bilateral BKA and also  lacks capacity, psychiatry consulted.  TOC closely assisting.  Might end up with prolonged hospitalization  Assessment & Plan:  Principal Problem:   Acute CVA (cerebrovascular accident) (HCC) Active Problems:   LV (left ventricular) mural thrombus   Chronic systolic CHF (congestive heart failure) (HCC)   Prolonged QT interval   Elevated liver enzymes   COPD (chronic obstructive pulmonary disease) (HCC)   Cocaine abuse (HCC)   Schizoaffective disorder, bipolar type (HCC)   Benign essential HTN   Acid reflux   History of DVT (deep vein thrombosis)   Type 2 diabetes mellitus (HCC)   Status post bilateral BKA.   Dilated cardiomyopathy (HCC)  Acute left MCA stroke: Initially presented with slurred speech, confusion, right-sided weakness.  CT head showed left MCA infarct, M2 occlusion.  MRI showed left basal ganglia infract.  Has residual right-sided weakness, dysarthria from before.  Neurology was consulted.  Stroke workup completed.  Left ventricular/left atrial/right ventricular thrombus and right lower EXTR DVT: Noted on echocardiogram.  Showed multiple clot  addressed to the septum along with small right ventricular clot as well.  Initially it admitted to ICU, currently on anticoagulation.  Hypercoagulable workup showed mildly low Antithrombin activity, low protein C activity.  Cardiology was following, now signed off.  Currently on Eliquis .  Acute on chronic hypoxic respiratory failure: Chronically on 2 L of oxygen  per minute at home.  Currently on baseline oxygen  requirement.  Agitation/lack of capacity: Main issue currently.  Psychiatry following.  Meets criteria for IVC based on lack of insight, aggressive behavior.  Not on IVC right now but if he wants to leave, needs to put on IVC.  Currently on Abilify .  Intermittently refuses care.  Also started on Latuda , gabapentin .  Debility/deconditioning: PT/OT recommending SNF on discharge.  Status post bilateral BKA.He declines SNF.  Difficulty in disposition due to lack of capacity.  TOC following and assisting.  Contacting APS to provide report and discuss about guardianship  HFrEF: Echo showed EF less than 20%.  Cardiology was following.  Currently euvolemic.  Continue to monitor, Coreg , Lasix , losartan .  Coronary artery disease: Not a candidate for invasive workup due to extensive substance abuse.  Actively using cocaine.  Hypertension/hyperlipidemia: Continue current medications  Diabetes type 2: Currently on sliding scale.  Monitor blood sugars  Polysubstance abuse: Drug screen positive for cocaine, appears,benzos.  Counseled for cessation         DVT prophylaxis: apixaban  (ELIQUIS ) tablet 5 mg     Code Status: Full Code  Family Communication: None at the bedside  Patient status:Inpatient  Patient is from :home  Anticipated discharge to:SNF  Estimated DC date: When placement is found   Consultants: Neurology,  cardiology, psychiatry  Procedures: None  Antimicrobials:  Anti-infectives (From admission, onward)    None       Subjective: Patient seen and examined at the  bedside today.  He was comfortably lying in bed, resting on his right elbow. He says he wants to go home.  We discussed about going to rehab as that would be the safe plan for him.  He got angry and said " dont touch me"  Objective: Vitals:   03/18/24 1618 03/18/24 1950 03/19/24 0301 03/19/24 0748  BP: 132/88 105/69 (!) 140/104 125/81  Pulse: (!) 55 80 77 73  Resp: 18 18 20 18   Temp: 97.6 F (36.4 C) 98.1 F (36.7 C) (!) 97.4 F (36.3 C) 98.4 F (36.9 C)  TempSrc: Oral Oral Oral Oral  SpO2: 97% 97% 96% 97%  Weight:      Height:        Intake/Output Summary (Last 24 hours) at 03/19/2024 0815 Last data filed at 03/18/2024 2022 Gross per 24 hour  Intake 237 ml  Output --  Net 237 ml   Filed Weights   03/14/24 0500 03/16/24 0500 03/17/24 0500  Weight: 76.3 kg 70.1 kg 63.4 kg    Examination:  General exam: Overall comfortable, not in distress, lying in bed, comfortable, bilateral BKA  Data Reviewed: I have personally reviewed following labs and imaging studies  CBC: Recent Labs  Lab 03/14/24 0912 03/15/24 1351  WBC 7.7 6.1  HGB 14.5 15.9  HCT 43.9 47.1  MCV 92.2 91.5  PLT 281 304   Basic Metabolic Panel: Recent Labs  Lab 03/14/24 0912  NA 138  K 4.1  CL 102  CO2 26  GLUCOSE 103*  BUN 19  CREATININE 0.95  CALCIUM  9.1  MG 1.9     No results found for this or any previous visit (from the past 240 hours).   Radiology Studies: No results found.  Scheduled Meds:  apixaban   5 mg Oral BID   arformoterol   15 mcg Nebulization BID   budesonide  (PULMICORT ) nebulizer solution  0.5 mg Nebulization BID   carvedilol   6.25 mg Oral BID WC   feeding supplement  237 mL Oral BID BM   furosemide   40 mg Oral Daily   gabapentin   200 mg Oral BID   losartan   50 mg Oral Daily   lurasidone   40 mg Oral Q breakfast   nicotine   14 mg Transdermal Daily   pantoprazole   40 mg Oral Daily   revefenacin   175 mcg Nebulization Daily   rosuvastatin   5 mg Oral QHS   spironolactone    12.5 mg Oral Daily   Continuous Infusions:   LOS: 15 days   Leona Rake, MD [facility] P4/19/2025, 8:15 AM

## 2024-03-19 NOTE — Plan of Care (Signed)
  Problem: Ischemic Stroke/TIA Tissue Perfusion: Goal: Complications of ischemic stroke/TIA will be minimized Outcome: Not Progressing   Problem: Health Behavior/Discharge Planning: Goal: Ability to manage health-related needs will improve Outcome: Not Progressing Goal: Goals will be collaboratively established with patient/family Outcome: Not Progressing   Problem: Education: Goal: Knowledge of disease or condition will improve Outcome: Not Progressing Goal: Knowledge of secondary prevention will improve (MUST DOCUMENT ALL) Outcome: Not Progressing Goal: Knowledge of patient specific risk factors will improve (DELETE if not current risk factor) Outcome: Not Progressing

## 2024-03-19 NOTE — Progress Notes (Signed)
 Patient [name] refused vitals, will notify RN.

## 2024-03-20 DIAGNOSIS — I639 Cerebral infarction, unspecified: Secondary | ICD-10-CM | POA: Diagnosis not present

## 2024-03-20 DIAGNOSIS — F250 Schizoaffective disorder, bipolar type: Secondary | ICD-10-CM | POA: Diagnosis not present

## 2024-03-20 DIAGNOSIS — F1410 Cocaine abuse, uncomplicated: Secondary | ICD-10-CM | POA: Diagnosis not present

## 2024-03-20 NOTE — Plan of Care (Signed)
  Problem: Education: Goal: Knowledge of disease or condition will improve Outcome: Progressing   Problem: Ischemic Stroke/TIA Tissue Perfusion: Goal: Complications of ischemic stroke/TIA will be minimized Outcome: Progressing   Problem: Coping: Goal: Will verbalize positive feelings about self Outcome: Progressing Goal: Will identify appropriate support needs Outcome: Progressing   Problem: Self-Care: Goal: Ability to participate in self-care as condition permits will improve Outcome: Progressing   Problem: Nutrition: Goal: Risk of aspiration will decrease Outcome: Progressing

## 2024-03-20 NOTE — Consult Note (Signed)
 [facility] Psychiatric Consult Follow up  Patient Name: .[name]  MRN: [id number]  DOB: 05-08-1966  Consult Order details:  Orders (From admission, onward)     Start     Ordered   03/14/24 1515  IP CONSULT TO PSYCHIATRY       Ordering Provider: Trenton Frock, MD  Provider:  (Not yet assigned)  Question Answer Comment  Location [facility]   Reason for Consult? Capacity evaluation -- patient verbally abusive, wants to go home, but oriented only to self.  Lives alone, seems to be unable to care for himself at home.  Essentially refusing all cares here      03/14/24 1514             Mode of Visit: In person    Psychiatry Consult Evaluation  Service Date: March 20, 2024 LOS:  LOS: 16 days  Chief Complaint 58 year old male with a history of cardiomyopathy, cocaine abuse who presented with slurred speech and confusion and right-sided weakness due to a left MCA infarct.  Consult was requested because of patient's agitation, verbally abusive and refusing care.  Primary Psychiatric Diagnoses  Schizoaffective disorder, bipolar type by history. 2.  Substance use disorder, cocaine use disorder 3.  Acute agitation with limited cognitive abilities due to multiple strokes  Assessment  [name] is a 58 y.o. male admitted: Medicallyfor 03/04/2024  2:31 PM for weakness and dizziness due to a stroke. He carries the psychiatric diagnoses of schizoaffective disorder and cocaine use disorder, severe.  And has a past medical history of asthma, COPD, hypertension, recent CVA..    03/15/2024: His current presentation of agitation is most consistent with schizoaffective disorder and cocaine use disorder.[name] He meets criteria for IVC based on lack of insight and aggressive behavior..  Current outpatient psychotropic medications include Abilify  and historically he has had a positive response to these medications. He was noncompliant compliant with medications prior to  admission as evidenced by aggressive behavior and agitation. On initial examination, patient was belligerent, labile and refusing all medications.. Please see plan below for detailed recommendations.  When seen today the patient is very agitated, labile demanding to go home and refusing care and medical treatment.  His speech is slurred and is somewhat redirectable for brief periods of time but would not allow vital signs or take medications.  The plan is to initiate an IVC and consider nonemergency forced medications if appropriate.  We will continue to encourage him to cooperate since he has an ACTT team that he is quite involved with his care.  The patient was seen in his room and was noted to be laying in bed.  He is a bilateral amputee of both the legs.  He is awake and alert but extremely loud and belligerent blaming that he wants to go home and he does not want anyone to help him with any treatment.  He maintained fair eye contact.  His speech is rapid and loud and labile and although he denies any auditory or visual hallucinations he seems hypervigilant and guarded and unable to comprehend why he is here.  He was informed that he had a stroke and will need to be treated before discharge.  However he seems to lack insight and understand why he is here.  He continues to demand to leave.  He would not even allow the nurse to do some vital signs. At this point one of the ACT team workers will obtain and the patient recognized him  and claims that he wants to go home with him.  We contacted the ACT team to obtain a list of his medications.  03/16/2024: The patient was seen and reevaluated today.  The chart was reviewed.  The patient remains labile and partially cooperative.  He has refused occupational therapy evaluation and was also seen by the hospitalist and the ACTT team.  With the redirection persuasion patient seems to comply with treatment.  He is back on his home medications which he is taking.  On  examination the patient is alert oriented and initially cooperative but with a further discussion he escalated in his behavior demanding to be discharged today and claiming that he has his own apartment and that he never really had a stroke.  He lacks insight and at his baseline tends to be argumentative and belligerent.  However, he is improved compared to yesterday.  Strongly encouraged him to continue with the medications and use PRNs as indicated.  He would benefit from APS services and probably a guardian given his history of multiple strokes and lack of insight.  03/18/2024: The client was watching television prior to the assessment.  He stated, "I'm fine".  When asked about his refusal to take Ability, he said, "I don't want to.  If I don't take it, I don't take it."  The sitter had no issues with behaviors today.  He requested ice cream and graham crackers which she went to retrieve.  Discussed other medications since he does not want the Abilify  and he agreed to Latuda  and gabapentin , started with Latuda  40 mg daily, discontinued the Abilify  and began gabapentin  200 mg BID to assist with his mood.   03/20/2024 Patient seen face to face in his hospital room. He was lying comfortably in bed and watching TV when this Clinical research associate entered his room. Patient does not appear to be in any acute distress initially, but became irritable and dismissive as soon as he saw this Clinical research associate. He refused to answer when asked how he has been doing lately and increased the volume of the TV when asked further questions saying "I've told you all that I'm doing okay and I don't need medications." Of note, patient has been refusing medications on this admission as confirmed from previous documentation.   Diagnoses:  Active Hospital problems: Principal Problem:   Acute CVA (cerebrovascular accident) [facility]) Active Problems:   Schizoaffective disorder, bipolar type (HCC)   Cocaine abuse (HCC)   Type 2 diabetes mellitus (HCC)    Prolonged QT interval   Acid reflux   History of DVT (deep vein thrombosis)   Status post bilateral BKA.   COPD (chronic obstructive pulmonary disease) (HCC)   Chronic systolic CHF (congestive heart failure) (HCC)   Benign essential HTN   LV (left ventricular) mural thrombus   Elevated liver enzymes   Dilated cardiomyopathy (HCC)    Plan   ## Psychiatric Medication Recommendations:  Abilify  discontinued as the patient continues to refuse it -Continue Latuda  40 mg daily -Continue Gabapentin  200 mg BID  ## Medical Decision Making Capacity:  Patient is his own guardian. Strongly suggest contacting APS services and may be initiating guardianship  ## Further Work-up:  -- None EKG -- most recent EKG on 03/10/2024 had QtC of 483.  However he has been on Abilify  for a long time and is EKG from February had a QTc of 489. -- Pertinent labwork reviewed earlier this admission includes: Continue with monitoring QTc intervals.   ## Disposition:-- Follow up with  ACT team  ## Behavioral / Environmental: -Difficult Patient (SELECT OPTIONS FROM BELOW), Recommend using specific terminology regarding PNES, i.e. call the episodes "non-epileptic seizures" rather than "pseudoseizures" as the latter insinuates "fake" or "feigned" symptoms, when the events are a very real experience to the patient and are a physical, non-volitional, manifestation of fear, pain and anxiety. , or Utilize compassion and acknowledge the patient's experiences while setting clear and realistic expectations for care.    ## Safety and Observation Level:  - Based on my clinical evaluation, I estimate the patient to be at low risk of self harm in the current setting. - At this time, we recommend  1:1 Observation. This decision is based on my review of the chart including patient's history and current presentation, interview of the patient, mental status examination, and consideration of suicide risk including evaluating suicidal  ideation, plan, intent, suicidal or self-harm behaviors, risk factors, and protective factors. This judgment is based on our ability to directly address suicide risk, implement suicide prevention strategies, and develop a safety plan while the patient is in the clinical setting. Please contact our team if there is a concern that risk level has changed.  CSSR Risk Category:C-SSRS RISK CATEGORY: No Risk  Suicide Risk Assessment: Patient has following modifiable risk factors for suicide: recklessness and medication noncompliance, which we are addressing by adding medications, using as needed antipsychotics and patient will require a nonemergency forced medication.. Patient has following non-modifiable or demographic risk factors for suicide: male gender, history of suicide attempt, history of self harm behavior, and psychiatric hospitalization Patient has the following protective factors against suicide: Access to outpatient mental health care  Thank you for this consult request. Recommendations have been communicated to the primary team.  We will continue to follow until stabilized.  At this time.   Windy Hatchet, MD       History of Present Illness  Relevant Aspects of [facility] Course:  Admitted on 03/04/2024 for weakness and dizziness as a result of a CVA.[name]   Patient Report:  Patient states" I want to go home"  Psych ROS:  Depression: Denied Anxiety: Appears anxious Mania (lifetime and current): Past history Psychosis: (lifetime and current): Past history  Collateral information:  Contacted ACTT at [facility] on 03/15/2024.  The ACT team came to visit him and following him while in the hospital.  Review of Systems  Psychiatric/Behavioral:  Positive for substance abuse.      Psychiatric and Social History  Psychiatric History:  Information collected from records  Prev Dx/Sx: Schizoaffective disorder Current Psych Provider: ACT team Home Meds (current): Abilify  5 mg  twice a day and hydroxyzine  25 mg every 6 hours as needed Previous Med Trials: Multiple trials Therapy: Currently followed up ACT team  Prior Psych Hospitalization: Last hospitalization was at [facility] in October 2024 Prior Self Harm: History of suicidal behavior Prior Violence: History of agitation and noncompliance  Family Psych History: Unknown Family Hx suicide: Unknown  Social History:  Developmental Hx: Please see records. Educational Hx: Unknown Occupational Hx: Unknown Legal Hx: None noted Living Situation: Patient has his own apartment Spiritual Hx: Unknown Access to weapons/lethal means: Unknown  Substance History Alcohol: History of alcohol use Type of alcohol unknown Last Drink unknown Number of drinks per day unknown History of alcohol withdrawal seizures unknown History of DT's past history Tobacco: Unknown Illicit drugs: Severe cocaine use disorder Prescription drug abuse: None Rehab hx: In the past.  Exam Findings  Physical Exam: Please see  the hospitalist note for an H&P Vital Signs:  Temp:  [98 F (36.7 C)] 98 F (36.7 C) (04/20 0516) Pulse Rate:  [104] 104 (04/20 0516) Resp:  [17] 17 (04/20 0516) BP: (106)/(78) 106/78 (04/20 0516) SpO2:  [96 %-98 %] 98 % (04/20 0755) Weight:  [46.9 kg] 46.9 kg (04/20 0350) Blood pressure 106/78, pulse (!) 104, temperature 98 F (36.7 C), temperature source Axillary, resp. rate 17, height 5\' 9"  (1.753 m), weight 46.9 kg, SpO2 98%. Body mass index is 15.27 kg/m.  Physical Exam HENT:     Head: Normocephalic.  Neurological:     Mental Status: He is alert.     Comments: The patient recently had a stroke.  He appears to have some slurred speech and is very irritable.     Mental Status Exam: General Appearance: Casual  Orientation:  Full (Time, Place, and Person)  Memory:  Recent;   Fair  Concentration:  FAir  Recall:  FAir  Attention  FAir  Eye Contact:  Good  Speech:  WDL  Language:  FAir  Volume:  WDL   Mood: denied depression, cooperative  Affect:  Labile  Thought Process:  Coherent and Linear  Thought Content:  goal directed  Suicidal Thoughts:  No  Homicidal Thoughts:  No  Judgement:  Fair to poor  Insight:  Shallow  Psychomotor Activity:  WDL  Akathisia:  No  Fund of Knowledge:  Fair      Assets:  Housing Resilience Social Support  Cognition:  WNL and Impaired,  Mild  ADL's:  Impaired  AIMS (if indicated):        Other History   These have been pulled in through the EMR, reviewed, and updated if appropriate.  Family History:  The patient's family history includes Drug abuse in his mother; Suicidality in his mother.  Medical History: Past Medical History:  Diagnosis Date   Asthma    Bipolar 1 disorder (HCC)    CHF (congestive heart failure) (HCC)    COPD (chronic obstructive pulmonary disease) (HCC)    Depression    Diabetes mellitus without complication (HCC)    GERD (gastroesophageal reflux disease)    Homelessness    Hypertension    QT prolongation    Schizoaffective disorder (HCC)     Surgical History: Past Surgical History:  Procedure Laterality Date   AMPUTATION Left 12/10/2022   Procedure: LEFT BELOW KNEE AMPUTATION;  Surgeon: Timothy Ford, MD;  Location: [facility] OR;  Service: Orthopedics;  Laterality: Left;   AMPUTATION TOE Bilateral 01/02/2021   Procedure: AMPUTATION TOE;  Surgeon: Camilo Cella, DPM;  Location: [facility] OR;  Service: Podiatry;  Laterality: Bilateral;  Ok to leave patient on hospital bed   IR ANGIO INTRA EXTRACRAN SEL COM CAROTID INNOMINATE BILAT MOD SED  12/17/2020   IR ANGIO VERTEBRAL SEL VERTEBRAL BILAT MOD SED  12/17/2020   MULTIPLE EXTRACTIONS WITH ALVEOLOPLASTY N/A 03/25/2023   Procedure: IRRIGATION AND DEBRIDEMENT, MULTIPLE EXTRACTION WITH ALVEOLOPLASTY X 4 , EXTRACTIONS 4 THROUGH 13, 20 THROUGHT 29;  Surgeon: Joseph Nickel, DMD;  Location: [facility] OR;  Service: Oral Surgery;  Laterality: N/A;   RADIOLOGY WITH ANESTHESIA N/A 12/17/2020    Procedure: IR WITH ANESTHESIA;  Surgeon: Radiologist, Medication, MD;  Location: [facility] OR;  Service: Radiology;  Laterality: N/A;     Medications:   Current Facility-Administered Medications:    albuterol  (PROVENTIL ) (2.5 MG/3ML) 0.083% nebulizer solution 3 mL, 3 mL, Nebulization, Q6H PRN, Desai, Nikita S, MD, 3 mL at 03/14/24 0320  apixaban  (ELIQUIS ) tablet 5 mg, 5 mg, Oral, BID, Millen, Jessica B, RPH, 5 mg at 03/20/24 0981   arformoterol  (BROVANA ) nebulizer solution 15 mcg, 15 mcg, Nebulization, BID, Desai, Nikita S, MD, 15 mcg at 03/20/24 1914   budesonide  (PULMICORT ) nebulizer solution 0.5 mg, 0.5 mg, Nebulization, BID, Desai, Nikita S, MD, 0.5 mg at 03/20/24 0755   carvedilol  (COREG ) tablet 6.25 mg, 6.25 mg, Oral, BID WC, Hilty, Aviva Lemmings, MD, 6.25 mg at 03/20/24 7829   feeding supplement (ENSURE ENLIVE / ENSURE PLUS) liquid 237 mL, 237 mL, Oral, BID BM, Pahwani, Ravi, MD, 237 mL at 03/18/24 1500   furosemide  (LASIX ) tablet 40 mg, 40 mg, Oral, Daily, [place], Hao, [place], 40 mg at 03/20/24 5621   gabapentin  (NEURONTIN ) capsule 200 mg, 200 mg, Oral, BID, Lord, Jamison Y, NP, 200 mg at 03/20/24 3086   losartan  (COZAAR ) tablet 50 mg, 50 mg, Oral, Daily, Hilty, Aviva Lemmings, MD, 50 mg at 03/19/24 1009   lurasidone  (LATUDA ) tablet 40 mg, 40 mg, Oral, Q breakfast, Lord, Jamison Y, NP   nicotine  (NICODERM CQ  - dosed in mg/24 hours) patch 14 mg, 14 mg, Transdermal, Daily, Desai, Nikita S, MD, 14 mg at 03/13/24 0950   oxyCODONE  (Oxy IR/ROXICODONE ) immediate release tablet 5 mg, 5 mg, Oral, Q4H PRN, Pokhrel, Laxman, MD, 5 mg at 03/16/24 1421   pantoprazole  (PROTONIX ) EC tablet 40 mg, 40 mg, Oral, Daily, Reome, Earle J, RPH, 40 mg at 03/19/24 1009   revefenacin  (YUPELRI ) nebulizer solution 175 mcg, 175 mcg, Nebulization, Daily, Desai, Nikita S, MD, 175 mcg at 03/20/24 5784   rosuvastatin  (CRESTOR ) tablet 5 mg, 5 mg, Oral, QHS, Desai, Nikita S, MD, 5 mg at 03/19/24 2151   spironolactone  (ALDACTONE ) tablet 12.5  mg, 12.5 mg, Oral, Daily, Hilty, Aviva Lemmings, MD, 12.5 mg at 03/19/24 1009  Allergies: Allergies  Allergen Reactions   Acetaminophen  Hives, Nausea And Vomiting, Rash and Anaphylaxis    PT states that if he takes this he is going to the hospital   Motrin  [Ibuprofen ] Anaphylaxis   Nsaids Anaphylaxis    PT states that if he takes this he is going to the hospital   Lyrica  Cr [Pregabalin  Er] Other (See Comments)    Dystonia Dysarthria    Peanut-Containing Drug Products Other (See Comments)    Pt denies allergy to peanut butter 03/02/2021    Windy Hatchet, MD

## 2024-03-20 NOTE — Progress Notes (Signed)
 PROGRESS NOTE  [name]  ZOX:[id number] DOB: 21-Jan-1966 DOA: 03/04/2024 PCP: Abraham Abo, MD   Brief Narrative: Patient is a 57-year male with history of cardiomyopathy, current cocaine abuse who presented with complaint of slurred speech, confusion, right-sided weakness.  Code stroke was called.  CT head showed left MCA infarct, CTA head and neck showed M2 occlusion on the left.  Patient was admitted for stroke workup.  Neurology consulted.  Echo showed large mobile left ventricular thrombus, left atrial thrombus, right ventricular apical thrombus with EF of 20%.  Cardiology involved.  Left ventricular thrombus was more than 3 cm in size.  Patient transferred out of ICU on 03/07/2024 to [facility] service.  Stroke workup completed.  PT/OT, SNF on discharge but he wants to go home.He has poor support, his bilateral BKA and also  lacks capacity, psychiatry consulted.  TOC closely assisting.  Might end up with prolonged hospitalization  Assessment & Plan:  Principal Problem:   Acute CVA (cerebrovascular accident) (HCC) Active Problems:   LV (left ventricular) mural thrombus   Chronic systolic CHF (congestive heart failure) (HCC)   Prolonged QT interval   Elevated liver enzymes   COPD (chronic obstructive pulmonary disease) (HCC)   Cocaine abuse (HCC)   Schizoaffective disorder, bipolar type (HCC)   Benign essential HTN   Acid reflux   History of DVT (deep vein thrombosis)   Type 2 diabetes mellitus (HCC)   Status post bilateral BKA.   Dilated cardiomyopathy (HCC)  Acute left MCA stroke: Initially presented with slurred speech, confusion, right-sided weakness.  CT head showed left MCA infarct, M2 occlusion.  MRI showed left basal ganglia infract.  Has residual right-sided weakness, dysarthria from before.  Neurology was consulted.  Stroke workup completed.  Left ventricular/left atrial/right ventricular thrombus and right lower EXTR DVT: Noted on echocardiogram.  Showed multiple clot  addressed to the septum along with small right ventricular clot as well.  Initially it admitted to ICU, currently on anticoagulation.  Hypercoagulable workup showed mildly low Antithrombin activity, low protein C activity.  Cardiology was following, now signed off.  Currently on Eliquis .  Acute on chronic hypoxic respiratory failure: Chronically on 2 L of oxygen  per minute at home. Has been now on room air  Agitation/lack of capacity: Main issue currently.  Psychiatry following.  Meets criteria for IVC based on lack of insight, aggressive behavior.  Not on IVC right now but if he wants to leave, needs to put on IVC.  Currently on Abilify .  Intermittently refuses care.  Also started on Latuda , gabapentin .  Debility/deconditioning: PT/OT recommending SNF on discharge.  Status post bilateral BKA.He declines SNF.  Difficulty in disposition due to lack of capacity.  TOC following and assisting.  Contacting APS to provide report and discuss about guardianship  HFrEF: Echo showed EF less than 20%.  Cardiology was following.  Currently euvolemic.  Continue to monitor, Coreg , Lasix , losartan .  Coronary artery disease: Not a candidate for invasive workup due to extensive substance abuse.  Actively using cocaine.  Hypertension/hyperlipidemia: Continue current medications  Diabetes type 2: Currently on sliding scale.  Monitor blood sugars  Polysubstance abuse: Drug screen positive for cocaine, appears,benzos.  Counseled for cessation         DVT prophylaxis: apixaban  (ELIQUIS ) tablet 5 mg     Code Status: Full Code  Family Communication: None at the bedside  Patient status:Inpatient  Patient is from :home  Anticipated discharge to:SNF  Estimated DC date: When placement is found   Consultants: Neurology,  cardiology, psychiatry  Procedures: None  Antimicrobials:  Anti-infectives (From admission, onward)    None       Subjective: Patient seen and examined at the bedside today.   Hemodynamically stable.  Lying in bed comfortably, watching TV.  Not in any current distress.  Denies any new complaints.  Objective: Vitals:   03/19/24 1946 03/20/24 0350 03/20/24 0516 03/20/24 0755  BP:   106/78   Pulse:   (!) 104   Resp:   17   Temp:   98 F (36.7 C)   TempSrc:   Axillary   SpO2: 97%  98% 98%  Weight:  46.9 kg    Height:        Intake/Output Summary (Last 24 hours) at 03/20/2024 1056 Last data filed at 03/20/2024 0517 Gross per 24 hour  Intake 240 ml  Output 800 ml  Net -560 ml   Filed Weights   03/16/24 0500 03/17/24 0500 03/20/24 0350  Weight: 70.1 kg 63.4 kg 46.9 kg    Examination:  General exam: Overall comfortable, not in distress HEENT: PERRL Respiratory system:  no wheezes or crackles  Cardiovascular system: S1 & S2 heard, RRR.  Gastrointestinal system: Abdomen is nondistended, soft and nontender. Central nervous system: Alert and oriented Extremities: B/L BKA Skin: No rashes, no ulcers,no icterus    Data Reviewed: I have personally reviewed following labs and imaging studies  CBC: Recent Labs  Lab 03/14/24 0912 03/15/24 1351  WBC 7.7 6.1  HGB 14.5 15.9  HCT 43.9 47.1  MCV 92.2 91.5  PLT 281 304   Basic Metabolic Panel: Recent Labs  Lab 03/14/24 0912  NA 138  K 4.1  CL 102  CO2 26  GLUCOSE 103*  BUN 19  CREATININE 0.95  CALCIUM  9.1  MG 1.9     No results found for this or any previous visit (from the past 240 hours).   Radiology Studies: No results found.  Scheduled Meds:  apixaban   5 mg Oral BID   arformoterol   15 mcg Nebulization BID   budesonide  (PULMICORT ) nebulizer solution  0.5 mg Nebulization BID   carvedilol   6.25 mg Oral BID WC   feeding supplement  237 mL Oral BID BM   furosemide   40 mg Oral Daily   gabapentin   200 mg Oral BID   losartan   50 mg Oral Daily   lurasidone   40 mg Oral Q breakfast   nicotine   14 mg Transdermal Daily   pantoprazole   40 mg Oral Daily   revefenacin   175 mcg Nebulization  Daily   rosuvastatin   5 mg Oral QHS   spironolactone   12.5 mg Oral Daily   Continuous Infusions:   LOS: 16 days   Leona Rake, MD [facility] P4/20/2025, 10:56 AM

## 2024-03-20 NOTE — Plan of Care (Signed)
  Problem: Self-Care: Goal: Ability to communicate needs accurately will improve Outcome: Progressing   Problem: Nutrition: Goal: Risk of aspiration will decrease Outcome: Progressing   

## 2024-03-21 DIAGNOSIS — I639 Cerebral infarction, unspecified: Secondary | ICD-10-CM | POA: Diagnosis not present

## 2024-03-21 MED ORDER — HALOPERIDOL LACTATE 5 MG/ML IJ SOLN
5.0000 mg | Freq: Once | INTRAMUSCULAR | Status: DC
  Filled 2024-03-21: qty 1

## 2024-03-21 NOTE — Plan of Care (Signed)
  Problem: Education: Goal: Knowledge of disease or condition will improve Outcome: Progressing Goal: Knowledge of secondary prevention will improve (MUST DOCUMENT ALL) Outcome: Progressing Goal: Knowledge of patient specific risk factors will improve (DELETE if not current risk factor) Outcome: Progressing   Problem: Ischemic Stroke/TIA Tissue Perfusion: Goal: Complications of ischemic stroke/TIA will be minimized Outcome: Progressing   Problem: Coping: Goal: Will verbalize positive feelings about self Outcome: Progressing Goal: Will identify appropriate support needs Outcome: Progressing   Problem: Health Behavior/Discharge Planning: Goal: Ability to manage health-related needs will improve Outcome: Progressing Goal: Goals will be collaboratively established with patient/family Outcome: Progressing   Problem: Self-Care: Goal: Ability to participate in self-care as condition permits will improve Outcome: Progressing Goal: Verbalization of feelings and concerns over difficulty with self-care will improve Outcome: Progressing Goal: Ability to communicate needs accurately will improve Outcome: Progressing   Problem: Nutrition: Goal: Risk of aspiration will decrease Outcome: Progressing Goal: Dietary intake will improve Outcome: Progressing

## 2024-03-21 NOTE — Consult Note (Addendum)
 [facility] Psychiatric Consult Follow up  Patient Name: .[name]  MRN: [id number]  DOB: [birth year]  Consult Order details:  Orders (From admission, onward)     Start     Ordered   03/14/24 1515  IP CONSULT TO PSYCHIATRY       Ordering Provider: Trenton Frock, MD  Provider:  (Not yet assigned)  Question Answer Comment  Location [facility]   Reason for Consult? Capacity evaluation -- patient verbally abusive, wants to go home, but oriented only to self.  Lives alone, seems to be unable to care for himself at home.  Essentially refusing all cares here      03/14/24 1514             Mode of Visit: In person    Psychiatry Consult Evaluation  Service Date: March 21, 2024 LOS:  LOS: 17 days  Chief Complaint 58 year old male with a history of cardiomyopathy, cocaine abuse who presented with slurred speech and confusion and right-sided weakness due to a left MCA infarct.  Consult was requested because of patient's agitation, verbally abusive and refusing care.  Primary Psychiatric Diagnoses  Schizoaffective disorder, bipolar type by history. 2.  Substance use disorder, cocaine use disorder 3.  Acute agitation with limited cognitive abilities due to multiple strokes  Assessment  Amon Costilla is a 58 y.o. male admitted: Medicallyfor 03/04/2024  2:31 PM for weakness and dizziness due to a stroke. He carries the psychiatric diagnoses of schizoaffective disorder and cocaine use disorder, severe.  And has a past medical history of asthma, COPD, hypertension, recent CVA..    03/15/2024: His current presentation of agitation is most consistent with schizoaffective disorder and cocaine use disorder.Aaron Aas He meets criteria for IVC based on lack of insight and aggressive behavior..  Current outpatient psychotropic medications include Abilify  and historically he has had a positive response to these medications. He was noncompliant compliant with medications prior to  admission as evidenced by aggressive behavior and agitation. On initial examination, patient was belligerent, labile and refusing all medications.. Please see plan below for detailed recommendations.  When seen today the patient is very agitated, labile demanding to go home and refusing care and medical treatment.  His speech is slurred and is somewhat redirectable for brief periods of time but would not allow vital signs or take medications.  The plan is to initiate an IVC and consider nonemergency forced medications if appropriate.  We will continue to encourage him to cooperate since he has an ACTT team that he is quite involved with his care.  The patient was seen in his room and was noted to be laying in bed.  He is a bilateral amputee of both the legs.  He is awake and alert but extremely loud and belligerent blaming that he wants to go home and he does not want anyone to help him with any treatment.  He maintained fair eye contact.  His speech is rapid and loud and labile and although he denies any auditory or visual hallucinations he seems hypervigilant and guarded and unable to comprehend why he is here.  He was informed that he had a stroke and will need to be treated before discharge.  However he seems to lack insight and understand why he is here.  He continues to demand to leave.  He would not even allow the nurse to do some vital signs. At this point one of the ACT team workers will obtain and the patient recognized him  and claims that he wants to go home with him.  We contacted the ACT team to obtain a list of his medications.  03/16/2024: The patient was seen and reevaluated today.  The chart was reviewed.  The patient remains labile and partially cooperative.  He has refused occupational therapy evaluation and was also seen by the hospitalist and the ACTT team.  With the redirection persuasion patient seems to comply with treatment.  He is back on his home medications which he is taking.  On  examination the patient is alert oriented and initially cooperative but with a further discussion he escalated in his behavior demanding to be discharged today and claiming that he has his own apartment and that he never really had a stroke.  He lacks insight and at his baseline tends to be argumentative and belligerent.  However, he is improved compared to yesterday.  Strongly encouraged him to continue with the medications and use PRNs as indicated.  He would benefit from APS services and probably a guardian given his history of multiple strokes and lack of insight.  03/18/2024 The client was watching television prior to the assessment.  He stated, "I'm fine".  When asked about his refusal to take Ability, he said, "I don't want to.  If I don't take it, I don't take it."  The sitter had no issues with behaviors today.  He requested ice cream and graham crackers which she went to retrieve.  Discussed other medications since he does not want the Abilify  and he agreed to Latuda  and gabapentin , started with Latuda  40 mg daily, discontinued the Abilify  and began gabapentin  200 mg BID to assist with his mood.   03/20/2024 Patient seen face to face in his hospital room. He was lying comfortably in bed and watching TV when this Clinical research associate entered his room. Patient does not appear to be in any acute distress initially, but became irritable and dismissive as soon as he saw this Clinical research associate. He refused to answer when asked how he has been doing lately and increased the volume of the TV when asked further questions saying "I've told you all that I'm doing okay and I don't need medications." Of note, patient has been refusing medications on this admission as confirmed from previous documentation.   03/21/2024: The patient was approached by the psychiatry consult team this morning but declined to participate, stating  he did not wish to speak with us  and asked the team to leave the room.  He has continued to refuse his  scheduled latuda .  Irritability and dismissive demeanor appear to be unchanged.  Diagnoses:  Active Hospital problems: Principal Problem:   Acute CVA (cerebrovascular accident) [facility]) Active Problems:   Schizoaffective disorder, bipolar type (HCC)   Cocaine abuse (HCC)   Type 2 diabetes mellitus (HCC)   Prolonged QT interval   Acid reflux   History of DVT (deep vein thrombosis)   Status post bilateral BKA.   COPD (chronic obstructive pulmonary disease) (HCC)   Chronic systolic CHF (congestive heart failure) (HCC)   Benign essential HTN   LV (left ventricular) mural thrombus   Elevated liver enzymes   Dilated cardiomyopathy (HCC)    Plan   ## Psychiatric Medication Recommendations:  Abilify  discontinued as the patient continues to refuse it -Continue Latuda  40 mg daily -Continue Gabapentin  200 mg BID  ## Medical Decision Making Capacity:  Patient is his own guardian. Strongly suggest contacting APS services and may be initiating guardianship  ## Further Work-up:  --  None EKG -- most recent EKG on 03/10/2024 had QtC of 483.  However he has been on Abilify  for a long time and is EKG from February had a QTc of 489. -- Pertinent labwork reviewed earlier this admission includes: Continue with monitoring QTc intervals.   ## Disposition:-- Follow up with ACT team  ## Behavioral / Environmental: -Difficult Patient (SELECT OPTIONS FROM BELOW), Recommend using specific terminology regarding PNES, i.e. call the episodes "non-epileptic seizures" rather than "pseudoseizures" as the latter insinuates "fake" or "feigned" symptoms, when the events are a very real experience to the patient and are a physical, non-volitional, manifestation of fear, pain and anxiety. , or Utilize compassion and acknowledge the patient's experiences while setting clear and realistic expectations for care.    ## Safety and Observation Level:  - Based on my clinical evaluation, I estimate the patient to be at low  risk of self harm in the current setting. - At this time, we recommend  routine. This decision is based on my review of the chart including patient's history and current presentation, interview of the patient, mental status examination, and consideration of suicide risk including evaluating suicidal ideation, plan, intent, suicidal or self-harm behaviors, risk factors, and protective factors. This judgment is based on our ability to directly address suicide risk, implement suicide prevention strategies, and develop a safety plan while the patient is in the clinical setting. Please contact our team if there is a concern that risk level has changed.  CSSR Risk Category:C-SSRS RISK CATEGORY: No Risk  Suicide Risk Assessment: Patient has following modifiable risk factors for suicide: recklessness and medication noncompliance, which we are addressing by adding medications, using as needed antipsychotics and patient will require a nonemergency forced medication.. Patient has following non-modifiable or demographic risk factors for suicide: male gender, history of suicide attempt, history of self harm behavior, and psychiatric hospitalization Patient has the following protective factors against suicide: Access to outpatient mental health care  Thank you for this consult request. Recommendations have been communicated to the primary team.  We will continue to follow until stabilized.  At this time.   Baltazar Bonier, MD       History of Present Illness  Relevant Aspects of [facility] Course:  Admitted on 03/04/2024 for weakness and dizziness as a result of a CVA.Aaron Aas   Patient Report:  Attempted to assess patient at bedside, when greeted the patient demanded that we leave the room.  He states "I am not going to speak with anyone from psychiatry, get out of my room".  Psych ROS:  Depression: Denied Anxiety: Appears anxious Mania (lifetime and current): Past history Psychosis: (lifetime and  current): Past history  Collateral information:  Contacted ACTT at [facility] on 03/15/2024.  The ACT team came to visit him and following him while in the hospital.  Review of Systems  Psychiatric/Behavioral:  Positive for substance abuse.      Psychiatric and Social History  Psychiatric History:  Information collected from records  Prev Dx/Sx: Schizoaffective disorder Current Psych Provider: ACT team Home Meds (current): Abilify  5 mg twice a day and hydroxyzine  25 mg every 6 hours as needed Previous Med Trials: Multiple trials Therapy: Currently followed up ACT team  Prior Psych Hospitalization: Last hospitalization was at [facility] in October 2024 Prior Self Harm: History of suicidal behavior Prior Violence: History of agitation and noncompliance  Family Psych History: Unknown Family Hx suicide: Unknown  Social History:  Developmental Hx: Please see records. Educational Hx: Unknown Occupational  Hx: Unknown Legal Hx: None noted Living Situation: Patient has his own apartment Spiritual Hx: Unknown Access to weapons/lethal means: Unknown  Substance History Alcohol: History of alcohol use Type of alcohol unknown Last Drink unknown Number of drinks per day unknown History of alcohol withdrawal seizures unknown History of DT's past history Tobacco: Unknown Illicit drugs: Severe cocaine use disorder Prescription drug abuse: None Rehab hx: In the past.  Exam Findings  Physical Exam: Please see the hospitalist note for an H&P Vital Signs:  Temp:  [97.4 F (36.3 C)-97.9 F (36.6 C)] 97.4 F (36.3 C) (04/21 1057) Pulse Rate:  [68-96] 96 (04/21 1057) Resp:  [18-19] 19 (04/21 1057) BP: (121-123)/(73-82) 123/82 (04/21 1057) SpO2:  [97 %-99 %] 97 % (04/21 1057) Weight:  [49.7 kg] 49.7 kg (04/21 0500) Blood pressure 123/82, pulse 96, temperature (!) 97.4 F (36.3 C), temperature source Axillary, resp. rate 19, height 5\' 9"  (1.753 m), weight 49.7 kg, SpO2 97%. Body mass  index is 16.18 kg/m.  Physical Exam HENT:     Head: Normocephalic.  Neurological:     Mental Status: He is alert.     Comments: The patient recently had a stroke.  He appears to have some slurred speech and is very irritable.     Mental Status Exam: General Appearance: Casual  Orientation:  Full (Time, Place, and Person)  Memory:  Recent;   Fair  Concentration:  FAir  Recall:  FAir  Attention  FAir  Eye Contact:  Good  Speech:  WDL  Language:  FAir  Volume:  WDL  Mood: denied depression, cooperative  Affect:  Labile  Thought Process:  Coherent and Linear  Thought Content:  Logical, no evidence of delusions or hallucinations  Suicidal Thoughts:  No  Homicidal Thoughts:  No  Judgement:  Fair to poor  Insight:  Shallow  Psychomotor Activity:  WDL  Akathisia:  No  Fund of Knowledge:  Fair      Assets:  Housing Resilience Social Support  Cognition:  WNL and Impaired,  Mild  ADL's:  Impaired  AIMS (if indicated):        Other History   These have been pulled in through the EMR, reviewed, and updated if appropriate.  Family History:  The patient's family history includes Drug abuse in his mother; Suicidality in his mother.  Medical History: Past Medical History:  Diagnosis Date   Asthma    Bipolar 1 disorder (HCC)    CHF (congestive heart failure) (HCC)    COPD (chronic obstructive pulmonary disease) (HCC)    Depression    Diabetes mellitus without complication (HCC)    GERD (gastroesophageal reflux disease)    Homelessness    Hypertension    QT prolongation    Schizoaffective disorder (HCC)     Surgical History: Past Surgical History:  Procedure Laterality Date   AMPUTATION Left 12/10/2022   Procedure: LEFT BELOW KNEE AMPUTATION;  Surgeon: Timothy Ford, MD;  Location: [facility] OR;  Service: Orthopedics;  Laterality: Left;   AMPUTATION TOE Bilateral 01/02/2021   Procedure: AMPUTATION TOE;  Surgeon: Camilo Cella, DPM;  Location: [facility] OR;  Service: Podiatry;   Laterality: Bilateral;  Ok to leave patient on hospital bed   IR ANGIO INTRA EXTRACRAN SEL COM CAROTID INNOMINATE BILAT MOD SED  12/17/2020   IR ANGIO VERTEBRAL SEL VERTEBRAL BILAT MOD SED  12/17/2020   MULTIPLE EXTRACTIONS WITH ALVEOLOPLASTY N/A 03/25/2023   Procedure: IRRIGATION AND DEBRIDEMENT, MULTIPLE EXTRACTION WITH ALVEOLOPLASTY X 4 , EXTRACTIONS  4 THROUGH 13, 20 THROUGHT 29;  Surgeon: Joseph Nickel, DMD;  Location: [facility] OR;  Service: Oral Surgery;  Laterality: N/A;   RADIOLOGY WITH ANESTHESIA N/A 12/17/2020   Procedure: IR WITH ANESTHESIA;  Surgeon: Radiologist, Medication, MD;  Location: [facility] OR;  Service: Radiology;  Laterality: N/A;     Medications:   Current Facility-Administered Medications:    albuterol  (PROVENTIL ) (2.5 MG/3ML) 0.083% nebulizer solution 3 mL, 3 mL, Nebulization, Q6H PRN, Desai, Nikita S, MD, 3 mL at 03/14/24 0320   apixaban  (ELIQUIS ) tablet 5 mg, 5 mg, Oral, BID, Millen, Jessica B, RPH, 5 mg at 03/21/24 0940   arformoterol  (BROVANA ) nebulizer solution 15 mcg, 15 mcg, Nebulization, BID, Desai, Nikita S, MD, 15 mcg at 03/21/24 6962   budesonide  (PULMICORT ) nebulizer solution 0.5 mg, 0.5 mg, Nebulization, BID, Desai, Nikita S, MD, 0.5 mg at 03/21/24 0752   carvedilol  (COREG ) tablet 6.25 mg, 6.25 mg, Oral, BID WC, Hilty, Aviva Lemmings, MD, 6.25 mg at 03/20/24 1634   feeding supplement (ENSURE ENLIVE / ENSURE PLUS) liquid 237 mL, 237 mL, Oral, BID BM, Pahwani, Ravi, MD, 237 mL at 03/21/24 0941   furosemide  (LASIX ) tablet 40 mg, 40 mg, Oral, Daily, Olney, Hao, [place], 40 mg at 03/20/24 9528   gabapentin  (NEURONTIN ) capsule 200 mg, 200 mg, Oral, BID, Lord, Jamison Y, NP, 200 mg at 03/21/24 0940   losartan  (COZAAR ) tablet 50 mg, 50 mg, Oral, Daily, Hilty, Aviva Lemmings, MD, 50 mg at 03/19/24 1009   lurasidone  (LATUDA ) tablet 40 mg, 40 mg, Oral, Q breakfast, Lord, Jamison Y, NP   nicotine  (NICODERM CQ  - dosed in mg/24 hours) patch 14 mg, 14 mg, Transdermal, Daily, Desai, Nikita S, MD, 14 mg at  03/13/24 0950   oxyCODONE  (Oxy IR/ROXICODONE ) immediate release tablet 5 mg, 5 mg, Oral, Q4H PRN, Pokhrel, Laxman, MD, 5 mg at 03/20/24 1226   pantoprazole  (PROTONIX ) EC tablet 40 mg, 40 mg, Oral, Daily, Reome, Earle J, RPH, 40 mg at 03/19/24 1009   revefenacin  (YUPELRI ) nebulizer solution 175 mcg, 175 mcg, Nebulization, Daily, Desai, Nikita S, MD, 175 mcg at 03/21/24 4132   rosuvastatin  (CRESTOR ) tablet 5 mg, 5 mg, Oral, QHS, Desai, Nikita S, MD, 5 mg at 03/19/24 2151   spironolactone  (ALDACTONE ) tablet 12.5 mg, 12.5 mg, Oral, Daily, Hilty, Aviva Lemmings, MD, 12.5 mg at 03/19/24 1009  Allergies: Allergies  Allergen Reactions   Acetaminophen  Hives, Nausea And Vomiting, Rash and Anaphylaxis    PT states that if he takes this he is going to the hospital   Motrin  [Ibuprofen ] Anaphylaxis   Nsaids Anaphylaxis    PT states that if he takes this he is going to the hospital   Lyrica  Cr [Pregabalin  Er] Other (See Comments)    Dystonia Dysarthria    Peanut-Containing Drug Products Other (See Comments)    Pt denies allergy to peanut butter 03/02/2021    Baltazar Bonier, MD

## 2024-03-21 NOTE — Progress Notes (Signed)
 PROGRESS NOTE  [name]  ZOX:[id number] DOB: [birth year] DOA: 03/04/2024 PCP: Abraham Abo, MD   Brief Narrative: Patient is a 57-year male with history of cardiomyopathy, current cocaine abuse who presented with complaint of slurred speech, confusion, right-sided weakness.  Code stroke was called.  CT head showed left MCA infarct, CTA head and neck showed M2 occlusion on the left.  Patient was admitted for stroke workup.  Neurology consulted.  Echo showed large mobile left ventricular thrombus, left atrial thrombus, right ventricular apical thrombus with EF of 20%.  Cardiology involved.  Left ventricular thrombus was more than 3 cm in size.  Patient transferred out of ICU on 03/07/2024 to [facility] service.  Stroke workup completed.  PT/OT, SNF on discharge but he wants to go home.He has poor support, his bilateral BKA and also  lacks capacity, psychiatry consulted.  TOC closely assisting.  Might end up with prolonged hospitalization  Assessment & Plan:  Principal Problem:   Acute CVA (cerebrovascular accident) (HCC) Active Problems:   LV (left ventricular) mural thrombus   Chronic systolic CHF (congestive heart failure) (HCC)   Prolonged QT interval   Elevated liver enzymes   COPD (chronic obstructive pulmonary disease) (HCC)   Cocaine abuse (HCC)   Schizoaffective disorder, bipolar type (HCC)   Benign essential HTN   Acid reflux   History of DVT (deep vein thrombosis)   Type 2 diabetes mellitus (HCC)   Status post bilateral BKA.   Dilated cardiomyopathy (HCC)  Acute left MCA stroke: Initially presented with slurred speech, confusion, right-sided weakness.  CT head showed left MCA infarct, M2 occlusion.  MRI showed left basal ganglia infract.  Has residual right-sided weakness, dysarthria from before.  Neurology was consulted.  Stroke workup completed.  Left ventricular/left atrial/right ventricular thrombus and right lower EXTR DVT: Noted on echocardiogram.  Showed multiple clot  addressed to the septum along with small right ventricular clot as well.  Initially it admitted to ICU, currently on anticoagulation.  Hypercoagulable workup showed mildly low Antithrombin activity, low protein C activity.  Cardiology was following, now signed off.  Currently on Eliquis .  Acute on chronic hypoxic respiratory failure: Chronically on 2 L of oxygen  per minute at home. Has been now on room air  Agitation/lack of capacity: Main issue currently.  Psychiatry following.  Meets criteria for IVC based on lack of insight, aggressive behavior.  Not on IVC right now but if he wants to leave, needs to put on IVC.  Intermittently refuses care.  Started on Latuda , gabapentin .  Debility/deconditioning: PT/OT recommending SNF on discharge.  Status post bilateral BKA.He declines SNF.  Difficulty in disposition due to lack of capacity.  TOC following and assisting.  Contacting APS to provide report and discuss about guardianship  HFrEF: Echo showed EF less than 20%.  Cardiology was following.  Currently euvolemic.  Continue to monitor, Coreg , Lasix , losartan .  Coronary artery disease: Not a candidate for invasive workup due to extensive substance abuse.  Actively using cocaine.  Hypertension/hyperlipidemia: Continue current medications  Diabetes type 2: Currently on sliding scale.  Monitor blood sugars  Polysubstance abuse: Drug screen positive for cocaine, appears,benzos.  Counseled for cessation         DVT prophylaxis: apixaban  (ELIQUIS ) tablet 5 mg     Code Status: Full Code  Family Communication: None at the bedside  Patient status:Inpatient  Patient is from :home  Anticipated discharge to:SNF  Estimated DC date: When placement is found   Consultants: Neurology, cardiology, psychiatry  Procedures: None  Antimicrobials:  Anti-infectives (From admission, onward)    None       Subjective: Patient seen and examined at bedside today.  Comfortably lying on bed.   Watching TV.  Denies any complaints.  Says he wants to go home  Objective: Vitals:   03/20/24 1951 03/21/24 0500 03/21/24 0753 03/21/24 1057  BP: 121/73   123/82  Pulse: 68  68 96  Resp:   18 19  Temp: 97.9 F (36.6 C)   (!) 97.4 F (36.3 C)  TempSrc: Oral   Axillary  SpO2: 99%   97%  Weight:  49.7 kg    Height:        Intake/Output Summary (Last 24 hours) at 03/21/2024 1150 Last data filed at 03/21/2024 0552 Gross per 24 hour  Intake 1560 ml  Output 1100 ml  Net 460 ml   Filed Weights   03/17/24 0500 03/20/24 0350 03/21/24 0500  Weight: 63.4 kg 46.9 kg 49.7 kg    Examination:   General exam: Overall comfortable, not in distress HEENT: PERRL Respiratory system:  no wheezes or crackles  Cardiovascular system: S1 & S2 heard, RRR.  Gastrointestinal system: Abdomen is nondistended, soft and nontender. Central nervous system: Alert and awake, mostly coherent Extremities: Bilateral BKA Skin: No rashes, no ulcers,no icterus    Data Reviewed: I have personally reviewed following labs and imaging studies  CBC: Recent Labs  Lab 03/15/24 1351  WBC 6.1  HGB 15.9  HCT 47.1  MCV 91.5  PLT 304   Basic Metabolic Panel: No results for input(s): "NA", "K", "CL", "CO2", "GLUCOSE", "BUN", "CREATININE", "CALCIUM ", "MG", "PHOS" in the last 168 hours.    No results found for this or any previous visit (from the past 240 hours).   Radiology Studies: No results found.  Scheduled Meds:  apixaban   5 mg Oral BID   arformoterol   15 mcg Nebulization BID   budesonide  (PULMICORT ) nebulizer solution  0.5 mg Nebulization BID   carvedilol   6.25 mg Oral BID WC   feeding supplement  237 mL Oral BID BM   furosemide   40 mg Oral Daily   gabapentin   200 mg Oral BID   losartan   50 mg Oral Daily   lurasidone   40 mg Oral Q breakfast   nicotine   14 mg Transdermal Daily   pantoprazole   40 mg Oral Daily   revefenacin   175 mcg Nebulization Daily   rosuvastatin   5 mg Oral QHS    spironolactone   12.5 mg Oral Daily   Continuous Infusions:   LOS: 17 days   Leona Rake, MD [facility] P4/21/2025, 11:50 AM

## 2024-03-21 NOTE — Progress Notes (Signed)
 SLP Cancellation Note  Patient Details Name: [name] MRN: [id number] DOB: June 29, 1966   Cancelled treatment:       Reason Eval/Treat Not Completed: Patient declined, no reason specified   [name],M.S.,CCC-SLP 03/21/2024, 12:17 PM

## 2024-03-21 NOTE — Progress Notes (Signed)
 Physical Therapy Treatment Patient Details Name: [name] MRN: [id number] DOB: January 25, 1966 Today's Date: 03/21/2024   History of Present Illness Patient is 58 y.o. male presented to ED on 03/04/24 with stroke like symptoms when caregiver found him with slurred speech and right sided weakness. Head CT reveals Acute left MCA territory infarct affecting the insula, caudate nucleus, lentiform nucleus and internal capsule. PMH significant of  Schizoaffective disorder, heart failure with reduced ejection fraction, pretension, DMII, COPD, s/p bilateral amputations, chronic pain, and polysubstance abuse.    PT Comments  Pt received in the hallway scooting across the floor very agitated yelling at nursing staff. After some time, pt was finally helped into [facility] by nursing staff with total A. Pt remained agitated then proceeded to propel WC to nurses station with supervision. After much deliberation from nursing staff pt was finally agreeable to return to his room via WC. When offered assistance, pt yelled at this therapist "Let me handle my business man!". Pt was then able to transfer himelf back to bed from [facility] and perform bed mobility with supervision. No change in DC/DME recs at this time. PT will continue to follow.      If plan is discharge home, recommend the following: Two people to help with walking and/or transfers;Two people to help with bathing/dressing/bathroom;Assistance with cooking/housework;Assistance with feeding;Direct supervision/assist for medications management;Assist for transportation;Help with stairs or ramp for entrance;Supervision due to cognitive status   Can travel by private vehicle     No  Equipment Recommendations  Wheelchair cushion (measurements PT);Wheelchair (measurements PT);Hoyer lift;Hospital bed    Recommendations for Other Services       Precautions / Restrictions Precautions Precautions: Fall Recall of Precautions/Restrictions: Impaired Precaution/Restrictions  Comments: h/o BLE amputations Restrictions Weight Bearing Restrictions Per Provider Order: No     Mobility  Bed Mobility Overal bed mobility: Needs Assistance Bed Mobility: Sit to Supine       Sit to supine: Supervision   General bed mobility comments: Pt laid down with head at foot of the bed requesting bed rail to be put up and to be left alone.    Transfers Overall transfer level: Needs assistance Equipment used: None Transfers: Bed to chair/wheelchair/BSC            Lateral/Scoot Transfers: Supervision General transfer comment: Pt able to scoot himself from [facility] back to bed. When offered assistance pt shouted "I got it man"    Ambulation/Gait                   Psychologist, counselling mobility: Yes Wheelchair propulsion: Both upper extremities Wheelchair parts: Supervision/cueing Distance: 50 Wheelchair Assistance Details (indicate cue type and reason): Able to manage brakes on his own. Agitated when given cues.   Tilt Bed    Modified Rankin (Stroke Patients Only) Modified Rankin (Stroke Patients Only) Pre-Morbid Rankin Score: Slight disability Modified Rankin: Severe disability     Balance Overall balance assessment: Needs assistance Sitting-balance support: Single extremity supported, Feet unsupported Sitting balance-Leahy Scale: Fair                                      Musician Communication: Impaired Factors Affecting Communication: Difficulty expressing self;Reduced clarity of speech  Cognition Arousal: Alert Behavior During Therapy: Agitated   PT - Cognitive impairments: Difficult to assess Difficult to  assess due to: Impaired communication                     PT - Cognition Comments: 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 Following commands: Impaired Following commands impaired: Follows one step commands inconsistently, Follows one step commands with  increased time    Cueing Cueing Techniques: Verbal cues, Tactile cues, Gestural cues  Exercises      General Comments General comments (skin integrity, edema, etc.): VSS. Very agitated.      Pertinent Vitals/Pain Pain Assessment Pain Assessment: No/denies pain    Home Living                          Prior Function            PT Goals (current goals can now be found in the care plan section) Progress towards PT goals: Progressing toward goals    Frequency    Min 2X/week      PT Plan      Co-evaluation              AM-PAC PT "6 Clicks" Mobility   Outcome Measure  Help needed turning from your back to your side while in a flat bed without using bedrails?: A Lot Help needed moving from lying on your back to sitting on the side of a flat bed without using bedrails?: A Lot Help needed moving to and from a bed to a chair (including a wheelchair)?: Total Help needed standing up from a chair using your arms (e.g., wheelchair or bedside chair)?: Total Help needed to walk in hospital room?: Total Help needed climbing 3-5 steps with a railing? : Total 6 Click Score: 8    End of Session   Activity Tolerance: Treatment limited secondary to agitation;Patient tolerated treatment well Patient left: in bed;with bed alarm set;with call bell/phone within reach;with nursing/sitter in room Nurse Communication: Mobility status PT Visit Diagnosis: Other abnormalities of gait and mobility (R26.89);Muscle weakness (generalized) (M62.81);Other symptoms and signs involving the nervous system (R29.898);Hemiplegia and hemiparesis Hemiplegia - Right/Left: Right Hemiplegia - caused by: Cerebral infarction     Time: 1352-1415 PT Time Calculation (min) (ACUTE ONLY): 23 min  Charges:    $Therapeutic Activity: 23-37 mins PT General Charges $$ ACUTE PT VISIT: 1 Visit                     Rodgers Clack, PT, DPT Acute Rehab Services [phone]    Chayne Baumgart 03/21/2024, 3:30  PM

## 2024-03-22 DIAGNOSIS — I639 Cerebral infarction, unspecified: Secondary | ICD-10-CM | POA: Diagnosis not present

## 2024-03-22 NOTE — Plan of Care (Signed)
  Problem: Education: Goal: Knowledge of disease or condition will improve 03/22/2024 0358 by Elex Grimmer, RN Outcome: Progressing 03/22/2024 0358 by Elex Grimmer, RN Outcome: Progressing Goal: Knowledge of secondary prevention will improve (MUST DOCUMENT ALL) 03/22/2024 0358 by Elex Grimmer, RN Outcome: Progressing 03/22/2024 0358 by Elex Grimmer, RN Outcome: Progressing Goal: Knowledge of patient specific risk factors will improve (DELETE if not current risk factor) 03/22/2024 0358 by Elex Grimmer, RN Outcome: Progressing 03/22/2024 0358 by Elex Grimmer, RN Outcome: Progressing   Problem: Ischemic Stroke/TIA Tissue Perfusion: Goal: Complications of ischemic stroke/TIA will be minimized 03/22/2024 0358 by Elex Grimmer, RN Outcome: Progressing 03/22/2024 0358 by Elex Grimmer, RN Outcome: Progressing   Problem: Coping: Goal: Will verbalize positive feelings about self Outcome: Progressing Goal: Will identify appropriate support needs 03/22/2024 0358 by Elex Grimmer, RN Outcome: Progressing 03/22/2024 0358 by Elex Grimmer, RN Outcome: Progressing   Problem: Health Behavior/Discharge Planning: Goal: Ability to manage health-related needs will improve 03/22/2024 0358 by Elex Grimmer, RN Outcome: Progressing 03/22/2024 0358 by Elex Grimmer, RN Outcome: Progressing Goal: Goals will be collaboratively established with patient/family 03/22/2024 0358 by Elex Grimmer, RN Outcome: Progressing 03/22/2024 0358 by Elex Grimmer, RN Outcome: Progressing   Problem: Self-Care: Goal: Ability to participate in self-care as condition permits will improve 03/22/2024 0358 by Elex Grimmer, RN Outcome: Progressing 03/22/2024 0358 by Elex Grimmer, RN Outcome: Progressing Goal: Verbalization of feelings and concerns over difficulty with self-care will improve 03/22/2024 0358 by Elex Grimmer, RN Outcome: Progressing 03/22/2024 0358 by  Elex Grimmer, RN Outcome: Progressing Goal: Ability to communicate needs accurately will improve 03/22/2024 0358 by Elex Grimmer, RN Outcome: Progressing 03/22/2024 0358 by Elex Grimmer, RN Outcome: Progressing   Problem: Nutrition: Goal: Risk of aspiration will decrease 03/22/2024 0358 by Elex Grimmer, RN Outcome: Progressing 03/22/2024 0358 by Elex Grimmer, RN Outcome: Progressing Goal: Dietary intake will improve 03/22/2024 0358 by Elex Grimmer, RN Outcome: Progressing 03/22/2024 0358 by Elex Grimmer, RN Outcome: Progressing

## 2024-03-22 NOTE — Progress Notes (Signed)
 Speech Language Pathology Discharge Patient Details Name: [name] MRN: [id number] DOB: 25-Jul-1966 Today's Date: 03/22/2024 Time:  -     Patient discharged from SLP services secondary to SLP has been attempting to see for dysphagia and cognition however he has refused therapy for the past 3 attempts from last week and yesterday, therefore will discharge ST.   Please see latest therapy progress note for current level of functioning and progress toward goals.    Progress and discharge plan discussed with patient and/or caregiver- not discusses with pt.    [name] 03/22/2024, 8:02 AM

## 2024-03-22 NOTE — Progress Notes (Signed)
 PROGRESS NOTE  [name]  ZOX:[id number] DOB: [birth year] DOA: 03/04/2024 PCP: Abraham Abo, MD   Brief Narrative: Patient is a 57-year male with history of cardiomyopathy, current cocaine abuse who presented with complaint of slurred speech, confusion, right-sided weakness.  Code stroke was called.  CT head showed left MCA infarct, CTA head and neck showed M2 occlusion on the left.  Patient was admitted for stroke workup.  Neurology consulted.  Echo showed large mobile left ventricular thrombus, left atrial thrombus, right ventricular apical thrombus with EF of 20%.  Cardiology involved.  Left ventricular thrombus was more than 3 cm in size.  Patient transferred out of ICU on 03/07/2024 to TRH service.  Stroke workup completed.  PT/OT, SNF on discharge but he wants to go home.He has poor support, his bilateral BKA and also  lacks capacity, psychiatry consulted.  TOC closely assisting.  Might end up with prolonged hospitalization  Assessment & Plan:  Principal Problem:   Acute CVA (cerebrovascular accident) (HCC) Active Problems:   LV (left ventricular) mural thrombus   Chronic systolic CHF (congestive heart failure) (HCC)   Prolonged QT interval   Elevated liver enzymes   COPD (chronic obstructive pulmonary disease) (HCC)   Cocaine abuse (HCC)   Schizoaffective disorder, bipolar type (HCC)   Benign essential HTN   Acid reflux   History of DVT (deep vein thrombosis)   Type 2 diabetes mellitus (HCC)   Status post bilateral BKA.   Dilated cardiomyopathy (HCC)  Acute left MCA stroke: Initially presented with slurred speech, confusion, right-sided weakness.  CT head showed left MCA infarct, M2 occlusion.  MRI showed left basal ganglia infract.  Has residual right-sided weakness, dysarthria from before.  Neurology was consulted.  Stroke workup completed.  Left ventricular/left atrial/right ventricular thrombus and right lower EXTR DVT: Noted on echocardiogram.  Showed multiple clot  addressed to the septum along with small right ventricular clot as well.  Initially it admitted to ICU, currently on anticoagulation.  Hypercoagulable workup showed mildly low Antithrombin activity, low protein C activity.  Cardiology was following, now signed off.  Currently on Eliquis .  Acute on chronic hypoxic respiratory failure: Chronically on 2 L of oxygen  per minute at home. Has been now on room air  Agitation/lack of capacity: Main issue currently.  Psychiatry following.  Meets criteria for IVC based on lack of insight, aggressive behavior.  Not on IVC right now but if he wants to leave, needs to put on IVC.  Intermittently refuses care.  Started on Latuda , gabapentin .  Debility/deconditioning: PT/OT recommending SNF on discharge.  Status post bilateral BKA.He declines SNF.  Difficulty in disposition due to lack of capacity.  TOC following and assisting.  Contacting APS to provide report and discuss about guardianship  HFrEF: Echo showed EF less than 20%.  Cardiology was following.  Currently euvolemic.  Continue to monitor, Coreg , Lasix , losartan .  Coronary artery disease: Not a candidate for invasive workup due to extensive substance abuse.  Actively using cocaine.  Hypertension/hyperlipidemia: Continue current medications  Diabetes type 2: Currently on sliding scale.  Monitor blood sugars  Polysubstance abuse: Drug screen positive for cocaine, appears,benzos.  Counseled for cessation         DVT prophylaxis: apixaban  (ELIQUIS ) tablet 5 mg     Code Status: Full Code  Family Communication: None at the bedside  Patient status:Inpatient  Patient is from :home  Anticipated discharge to:SNF  Estimated DC date: When placement is found   Consultants: Neurology, cardiology, psychiatry  Procedures: None  Antimicrobials:  Anti-infectives (From admission, onward)    None       Subjective: Patient seen and examined at the bedside today.  Comfortable lying in bed,  sleeping.  Not in any Distress.  He was agitated yesterday and was trying to leave the hospital on the wheelchair.  He had to be given Haldol .  Objective: Vitals:   03/22/24 0500 03/22/24 0544 03/22/24 0747 03/22/24 0823  BP:   (!) 142/94   Pulse:   76   Resp:   16   Temp:   98.3 F (36.8 C)   TempSrc:   Axillary   SpO2:  97% 100% 97%  Weight: 39.6 kg     Height:        Intake/Output Summary (Last 24 hours) at 03/22/2024 1110 Last data filed at 03/22/2024 0700 Gross per 24 hour  Intake --  Output 900 ml  Net -900 ml   Filed Weights   03/20/24 0350 03/21/24 0500 03/22/24 0500  Weight: 46.9 kg 49.7 kg 39.6 kg    Examination:   General exam: Overall comfortable, not in distress, lying in bed, sleeping, bilateral BKA, no rash or ulcers  Data Reviewed: I have personally reviewed following labs and imaging studies  CBC: Recent Labs  Lab 03/15/24 1351  WBC 6.1  HGB 15.9  HCT 47.1  MCV 91.5  PLT 304   Basic Metabolic Panel: No results for input(s): "NA", "K", "CL", "CO2", "GLUCOSE", "BUN", "CREATININE", "CALCIUM ", "MG", "PHOS" in the last 168 hours.    No results found for this or any previous visit (from the past 240 hours).   Radiology Studies: No results found.  Scheduled Meds:  apixaban   5 mg Oral BID   arformoterol   15 mcg Nebulization BID   budesonide  (PULMICORT ) nebulizer solution  0.5 mg Nebulization BID   carvedilol   6.25 mg Oral BID WC   feeding supplement  237 mL Oral BID BM   furosemide   40 mg Oral Daily   gabapentin   200 mg Oral BID   haloperidol  lactate  5 mg Intramuscular Once   losartan   50 mg Oral Daily   lurasidone   40 mg Oral Q breakfast   nicotine   14 mg Transdermal Daily   pantoprazole   40 mg Oral Daily   revefenacin   175 mcg Nebulization Daily   rosuvastatin   5 mg Oral QHS   spironolactone   12.5 mg Oral Daily   Continuous Infusions:   LOS: 18 days   Leona Rake, MD [facility] P4/22/2025, 11:10 AM

## 2024-03-22 NOTE — Plan of Care (Signed)
  Problem: Education: Goal: Knowledge of disease or condition will improve Outcome: Progressing Goal: Knowledge of secondary prevention will improve (MUST DOCUMENT ALL) Outcome: Progressing Goal: Knowledge of patient specific risk factors will improve (DELETE if not current risk factor) Outcome: Progressing   Problem: Ischemic Stroke/TIA Tissue Perfusion: Goal: Complications of ischemic stroke/TIA will be minimized Outcome: Progressing   Problem: Coping: Goal: Will verbalize positive feelings about self Outcome: Progressing Goal: Will identify appropriate support needs Outcome: Progressing   Problem: Health Behavior/Discharge Planning: Goal: Ability to manage health-related needs will improve Outcome: Progressing Goal: Goals will be collaboratively established with patient/family Outcome: Progressing   Problem: Self-Care: Goal: Ability to participate in self-care as condition permits will improve Outcome: Progressing Goal: Verbalization of feelings and concerns over difficulty with self-care will improve Outcome: Progressing Goal: Ability to communicate needs accurately will improve Outcome: Progressing   Problem: Nutrition: Goal: Risk of aspiration will decrease Outcome: Progressing Goal: Dietary intake will improve Outcome: Progressing

## 2024-03-23 DIAGNOSIS — I639 Cerebral infarction, unspecified: Secondary | ICD-10-CM | POA: Diagnosis not present

## 2024-03-23 LAB — CBC
HCT: 46.9 % (ref 39.0–52.0)
Hemoglobin: 15.3 g/dL (ref 13.0–17.0)
MCH: 30.2 pg (ref 26.0–34.0)
MCHC: 32.6 g/dL (ref 30.0–36.0)
MCV: 92.7 fL (ref 80.0–100.0)
Platelets: 284 10*3/uL (ref 150–400)
RBC: 5.06 MIL/uL (ref 4.22–5.81)
RDW: 15.5 % (ref 11.5–15.5)
WBC: 5 10*3/uL (ref 4.0–10.5)
nRBC: 0 % (ref 0.0–0.2)

## 2024-03-23 LAB — BASIC METABOLIC PANEL WITH GFR
Anion gap: 9 (ref 5–15)
BUN: 20 mg/dL (ref 6–20)
CO2: 25 mmol/L (ref 22–32)
Calcium: 9.2 mg/dL (ref 8.9–10.3)
Chloride: 104 mmol/L (ref 98–111)
Creatinine, Ser: 0.99 mg/dL (ref 0.61–1.24)
GFR, Estimated: >60 mL/min (ref >60)
Glucose, Bld: 101 mg/dL — ABNORMAL HIGH (ref 70–99)
Potassium: 4.7 mmol/L (ref 3.5–5.1)
Sodium: 138 mmol/L (ref 135–145)

## 2024-03-23 MED ORDER — OXYCODONE HCL 5 MG PO TABS
5.0000 mg | ORAL_TABLET | Freq: Once | ORAL | Status: AC
  Administered 2024-03-23: 5 mg via ORAL
  Filled 2024-03-23: qty 1

## 2024-03-23 NOTE — Plan of Care (Signed)
  Problem: Ischemic Stroke/TIA Tissue Perfusion: Goal: Complications of ischemic stroke/TIA will be minimized 03/23/2024 0600 by Francena Infield, RN Outcome: Progressing 03/23/2024 0505 by Francena Infield, RN Outcome: Progressing   Problem: Education: Goal: Knowledge of disease or condition will improve 03/23/2024 0600 by Francena Infield, RN Outcome: Progressing 03/23/2024 0505 by Francena Infield, RN Outcome: Progressing Goal: Knowledge of secondary prevention will improve (MUST DOCUMENT ALL) 03/23/2024 0600 by Francena Infield, RN Outcome: Progressing 03/23/2024 0505 by Francena Infield, RN Outcome: Progressing Goal: Knowledge of patient specific risk factors will improve (DELETE if not current risk factor) 03/23/2024 0600 by Francena Infield, RN Outcome: Progressing 03/23/2024 0505 by Francena Infield, RN Outcome: Progressing

## 2024-03-23 NOTE — Progress Notes (Signed)
 PROGRESS NOTE  [name]  ZOX:[id number] DOB: 03-11-1966 DOA: 03/04/2024 PCP: Abraham Abo, MD   Brief Narrative: Patient is a 57-year male with history of cardiomyopathy, current cocaine abuse who presented with complaint of slurred speech, confusion, right-sided weakness.  Code stroke was called.  CT head showed left MCA infarct, CTA head and neck showed M2 occlusion on the left.  Patient was admitted for stroke workup.  Neurology consulted.  Echo showed large mobile left ventricular thrombus, left atrial thrombus, right ventricular apical thrombus with EF of 20%.  Cardiology involved.  Left ventricular thrombus was more than 3 cm in size.  Patient transferred out of ICU on 03/07/2024 to [facility] service.  Stroke workup completed.  PT/OT, SNF on discharge but he wants to go home.He has poor support, his bilateral BKA and also  lacks capacity, psychiatry consulted.  TOC closely assisting.  Might end up with prolonged hospitalization  Assessment & Plan:  Principal Problem:   Acute CVA (cerebrovascular accident) (HCC) Active Problems:   LV (left ventricular) mural thrombus   Chronic systolic CHF (congestive heart failure) (HCC)   Prolonged QT interval   Elevated liver enzymes   COPD (chronic obstructive pulmonary disease) (HCC)   Cocaine abuse (HCC)   Schizoaffective disorder, bipolar type (HCC)   Benign essential HTN   Acid reflux   History of DVT (deep vein thrombosis)   Type 2 diabetes mellitus (HCC)   Status post bilateral BKA.   Dilated cardiomyopathy (HCC)  Acute left MCA stroke: Initially presented with slurred speech, confusion, right-sided weakness.  CT head showed left MCA infarct, M2 occlusion.  MRI showed left basal ganglia infract.  Has residual right-sided weakness, dysarthria from before.  Neurology was consulted.  Stroke workup completed.  Left ventricular/left atrial/right ventricular thrombus and right lower EXTR DVT: Noted on echocardiogram.  Showed multiple clot  addressed to the septum along with small right ventricular clot as well.  Initially it admitted to ICU, currently on anticoagulation.  Hypercoagulable workup showed mildly low Antithrombin activity, low protein C activity.  Cardiology was following, now signed off.  Currently on Eliquis .  Acute on chronic hypoxic respiratory failure: Chronically on 2 L of oxygen  per minute at home. Has been now on room air  Agitation/lack of capacity: Main issue currently.  Psychiatry following.  Meets criteria for IVC based on lack of insight, aggressive behavior.  Not on IVC right now but if he wants to leave, needs to put on IVC.  Intermittently refuses care.  Started on Latuda , gabapentin .  Debility/deconditioning: PT/OT recommending SNF on discharge.  Status right AKA,left BKA.He declines SNF.  Difficulty in disposition due to lack of capacity.  TOC following and assisting.  Contacting APS to provide report and discuss about guardianship  HFrEF: Echo showed EF less than 20%.  Cardiology was following.  Currently euvolemic.  Continue to monitor, Coreg , Lasix , losartan .  Coronary artery disease: Not a candidate for invasive workup due to extensive substance abuse.  Actively using cocaine.  Hypertension/hyperlipidemia: Continue current medications  Diabetes type 2: Currently on sliding scale.  Monitor blood sugars  Polysubstance abuse: Drug screen positive for cocaine, appears,benzos.  Counseled for cessation         DVT prophylaxis: apixaban  (ELIQUIS ) tablet 5 mg     Code Status: Full Code  Family Communication: None at the bedside  Patient status:Inpatient  Patient is from :home  Anticipated discharge to:SNF  Estimated DC date: When placement is found   Consultants: Neurology, cardiology, psychiatry  Procedures: None  Antimicrobials:  Anti-infectives (From admission, onward)    None       Subjective: Patient seen and examined at bedside today.  Hemodynamically stable.  Very  comfortable today.  He communicated with me and says that he wants to eat double portion of meals.  Not in any current distress.  Moving his bilateral upper extremities.  Sitting in bed.  Has dysarthria.  Objective: Vitals:   03/23/24 0747 03/23/24 0822 03/23/24 0827 03/23/24 0829  BP: 134/86     Pulse: 79 79    Resp: 19 18    Temp: (!) 97.2 F (36.2 C)     TempSrc: Axillary     SpO2: 100% 99% 99% 99%  Weight:      Height:        Intake/Output Summary (Last 24 hours) at 03/23/2024 1107 Last data filed at 03/23/2024 0425 Gross per 24 hour  Intake 360 ml  Output 900 ml  Net -540 ml   Filed Weights   03/21/24 0500 03/22/24 0500 03/23/24 0425  Weight: 49.7 kg 39.6 kg 42 kg    Examination:   General exam: Overall comfortable, not in distress HEENT: PERRL Respiratory system:  no wheezes or crackles  Cardiovascular system: S1 & S2 heard, RRR.  Gastrointestinal system: Abdomen is nondistended, soft and nontender. Central nervous system: Alert and awake, not oriented to time, dysarthria Extremities:Right AKA,left BKA Skin: No rashes, no ulcers,no icterus    Data Reviewed: I have personally reviewed following labs and imaging studies  CBC: Recent Labs  Lab 03/23/24 0612  WBC 5.0  HGB 15.3  HCT 46.9  MCV 92.7  PLT 284   Basic Metabolic Panel: Recent Labs  Lab 03/23/24 0612  NA 138  K 4.7  CL 104  CO2 25  GLUCOSE 101*  BUN 20  CREATININE 0.99  CALCIUM  9.2      No results found for this or any previous visit (from the past 240 hours).   Radiology Studies: No results found.  Scheduled Meds:  apixaban   5 mg Oral BID   arformoterol   15 mcg Nebulization BID   budesonide  (PULMICORT ) nebulizer solution  0.5 mg Nebulization BID   carvedilol   6.25 mg Oral BID WC   feeding supplement  237 mL Oral BID BM   furosemide   40 mg Oral Daily   gabapentin   200 mg Oral BID   haloperidol  lactate  5 mg Intramuscular Once   losartan   50 mg Oral Daily   lurasidone   40 mg  Oral Q breakfast   nicotine   14 mg Transdermal Daily   pantoprazole   40 mg Oral Daily   revefenacin   175 mcg Nebulization Daily   rosuvastatin   5 mg Oral QHS   spironolactone   12.5 mg Oral Daily   Continuous Infusions:   LOS: 19 days   Leona Rake, MD [facility] P4/23/2025, 11:07 AM

## 2024-03-23 NOTE — Plan of Care (Signed)
  Problem: Education: Goal: Knowledge of disease or condition will improve Outcome: Progressing Goal: Knowledge of secondary prevention will improve (MUST DOCUMENT ALL) Outcome: Progressing Goal: Knowledge of patient specific risk factors will improve (DELETE if not current risk factor) Outcome: Progressing   Problem: Ischemic Stroke/TIA Tissue Perfusion: Goal: Complications of ischemic stroke/TIA will be minimized Outcome: Progressing   Problem: Coping: Goal: Will verbalize positive feelings about self Outcome: Progressing Goal: Will identify appropriate support needs Outcome: Progressing   Problem: Health Behavior/Discharge Planning: Goal: Ability to manage health-related needs will improve Outcome: Progressing Goal: Goals will be collaboratively established with patient/family Outcome: Progressing   Problem: Self-Care: Goal: Ability to participate in self-care as condition permits will improve Outcome: Progressing Goal: Verbalization of feelings and concerns over difficulty with self-care will improve Outcome: Progressing Goal: Ability to communicate needs accurately will improve Outcome: Progressing   Problem: Nutrition: Goal: Risk of aspiration will decrease Outcome: Progressing Goal: Dietary intake will improve Outcome: Progressing

## 2024-03-23 NOTE — Plan of Care (Signed)
  Problem: Coping: Goal: Will verbalize positive feelings about self Outcome: Progressing Goal: Will identify appropriate support needs Outcome: Progressing   Problem: Nutrition: Goal: Risk of aspiration will decrease Outcome: Progressing Goal: Dietary intake will improve Outcome: Progressing   Problem: Self-Care: Goal: Ability to participate in self-care as condition permits will improve Outcome: Progressing Goal: Verbalization of feelings and concerns over difficulty with self-care will improve Outcome: Progressing Goal: Ability to communicate needs accurately will improve Outcome: Progressing   Problem: Ischemic Stroke/TIA Tissue Perfusion: Goal: Complications of ischemic stroke/TIA will be minimized Outcome: Progressing    Patient's safety protocol continued

## 2024-03-24 ENCOUNTER — Telehealth: Payer: Self-pay | Admitting: Family Medicine

## 2024-03-24 ENCOUNTER — Ambulatory Visit: Payer: MEDICAID | Admitting: Family Medicine

## 2024-03-24 DIAGNOSIS — R42 Dizziness and giddiness: Secondary | ICD-10-CM

## 2024-03-24 DIAGNOSIS — I69315 Cognitive social or emotional deficit following cerebral infarction: Secondary | ICD-10-CM | POA: Diagnosis not present

## 2024-03-24 DIAGNOSIS — I639 Cerebral infarction, unspecified: Secondary | ICD-10-CM | POA: Diagnosis not present

## 2024-03-24 DIAGNOSIS — F250 Schizoaffective disorder, bipolar type: Secondary | ICD-10-CM | POA: Diagnosis not present

## 2024-03-24 DIAGNOSIS — I69318 Other symptoms and signs involving cognitive functions following cerebral infarction: Secondary | ICD-10-CM

## 2024-03-24 NOTE — Progress Notes (Addendum)
 Physical Therapy Treatment Patient Details Name: [name] MRN: [id number] DOB: 01/19/1966 Today's Date: 03/24/2024   History of Present Illness Patient is 58 y.o. male presented to ED on 03/04/24 with stroke like symptoms when caregiver found him with slurred speech and right sided weakness. Head CT reveals Acute left MCA territory infarct affecting the insula, caudate nucleus, lentiform nucleus and internal capsule. PMH significant of  Schizoaffective disorder, heart failure with reduced ejection fraction, pretension, DMII, COPD, s/p bilateral amputations, chronic pain, and polysubstance abuse.    PT Comments  Pt tolerated treatment well today. Pt was able to lateral scoot transfer into [facility] with supervision. Cues for safety and locking brakes. Able to propel majority of unit however requested to be pushed back on final 51ft due to fatigue. Pt appears to be fairly close baseline however still is somewhat limited by poor safety awareness (unsure if this was baseline?) and behavioral issues. DC recs updated to HHPT. PT will continue to follow however may sign off soon if pt continues to progress.      If plan is discharge home, recommend the following: Two people to help with walking and/or transfers;Two people to help with bathing/dressing/bathroom;Assistance with cooking/housework;Assistance with feeding;Direct supervision/assist for medications management;Assist for transportation;Help with stairs or ramp for entrance;Supervision due to cognitive status   Can travel by private vehicle     No  Equipment Recommendations  None recommended by PT    Recommendations for Other Services       Precautions / Restrictions Precautions Precautions: Fall Recall of Precautions/Restrictions: Impaired Precaution/Restrictions Comments: h/o BLE amputations Restrictions Weight Bearing Restrictions Per Provider Order: No     Mobility  Bed Mobility Overal bed mobility: Modified Independent Bed  Mobility: Supine to Sit, Sit to Supine     Supine to sit: Modified independent (Device/Increase time) Sit to supine: Modified independent (Device/Increase time)        Transfers Overall transfer level: Needs assistance Equipment used: None Transfers: Bed to chair/wheelchair/BSC            Lateral/Scoot Transfers: Supervision General transfer comment: Pt able to scoot in and out of WC with supervision and increased time. Cues for safety and locking brakes due to pt poor safety awareness.    Ambulation/Gait                   Psychologist, counselling mobility: Yes Wheelchair propulsion: Both upper extremities Wheelchair parts: Supervision/cueing Distance: 300 Wheelchair Assistance Details (indicate cue type and reason): Cues for safety and locking brakes. Able to propel majority of unit however requested to be pushed back on final 84ft due to fatigue.   Tilt Bed    Modified Rankin (Stroke Patients Only) Modified Rankin (Stroke Patients Only) Pre-Morbid Rankin Score: Slight disability Modified Rankin: Severe disability     Balance Overall balance assessment: Needs assistance Sitting-balance support: Single extremity supported, Feet unsupported Sitting balance-Leahy Scale: Good                                      Communication Communication Communication: Impaired Factors Affecting Communication: Difficulty expressing self;Reduced clarity of speech  Cognition Arousal: Alert Behavior During Therapy: WFL for tasks assessed/performed   PT - Cognitive impairments: Difficult to assess Difficult to assess due to: Impaired communication  PT - Cognition Comments: 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 Following commands: Impaired Following commands impaired: Follows one step commands inconsistently, Follows one step commands with increased time    Cueing Cueing Techniques: Verbal  cues, Tactile cues, Gestural cues  Exercises      General Comments General comments (skin integrity, edema, etc.): VSS      Pertinent Vitals/Pain Pain Assessment Pain Assessment: No/denies pain    Home Living                          Prior Function            PT Goals (current goals can now be found in the care plan section) Progress towards PT goals: Progressing toward goals    Frequency    Min 2X/week      PT Plan      Co-evaluation              AM-PAC PT "6 Clicks" Mobility   Outcome Measure  Help needed turning from your back to your side while in a flat bed without using bedrails?: None Help needed moving from lying on your back to sitting on the side of a flat bed without using bedrails?: None Help needed moving to and from a bed to a chair (including a wheelchair)?: A Little Help needed standing up from a chair using your arms (e.g., wheelchair or bedside chair)?: Total Help needed to walk in hospital room?: Total Help needed climbing 3-5 steps with a railing? : Total 6 Click Score: 14    End of Session   Activity Tolerance: Patient tolerated treatment well Patient left: in bed;with call bell/phone within reach (Pt refused bed alarm) Nurse Communication: Mobility status PT Visit Diagnosis: Other abnormalities of gait and mobility (R26.89);Muscle weakness (generalized) (M62.81);Other symptoms and signs involving the nervous system (R29.898);Hemiplegia and hemiparesis Hemiplegia - Right/Left: Right Hemiplegia - dominant/non-dominant: Dominant Hemiplegia - caused by: Cerebral infarction     Time: 4403-4742 PT Time Calculation (min) (ACUTE ONLY): 30 min  Charges:    $Therapeutic Activity: 8-22 mins PT General Charges $$ ACUTE PT VISIT: 1 Visit                     [name], PT, DPT Acute Rehab Services [phone]    [name] 03/24/2024, 4:16 PM

## 2024-03-24 NOTE — Plan of Care (Signed)
  Problem: Nutrition: Goal: Risk of aspiration will decrease Outcome: Progressing Goal: Dietary intake will improve Outcome: Progressing   Problem: Coping: Goal: Will verbalize positive feelings about self Outcome: Progressing Goal: Will identify appropriate support needs Outcome: Progressing   Problem: Education: Goal: Knowledge of disease or condition will improve Outcome: Progressing Goal: Knowledge of secondary prevention will improve (MUST DOCUMENT ALL) Outcome: Progressing Goal: Knowledge of patient specific risk factors will improve (DELETE if not current risk factor) Outcome: Progressing  Safety precautions progressing

## 2024-03-24 NOTE — Progress Notes (Signed)
 PROGRESS NOTE  [name]  ZOX:[id number] DOB: 31-Jul-1966 DOA: 03/04/2024 PCP: Abraham Abo, MD   Brief Narrative: Patient is a 57-year male with history of cardiomyopathy, current cocaine abuse who presented with complaint of slurred speech, confusion, right-sided weakness.  Code stroke was called.  CT head showed left MCA infarct, CTA head and neck showed M2 occlusion on the left.  Patient was admitted for stroke workup.  Neurology consulted.  Echo showed large mobile left ventricular thrombus, left atrial thrombus, right ventricular apical thrombus with EF of 20%.  Cardiology involved.  Left ventricular thrombus was more than 3 cm in size.  Patient transferred out of ICU on 03/07/2024 to [facility] service.  Stroke workup completed.  PT/OT, SNF on discharge but he wants to go home.He has poor support, his bilateral BKA and also  lacks capacity, psychiatry consulted. He cannot be discharged to home. TOC closely assisting.  Might end up with prolonged hospitalization  Assessment & Plan:  Principal Problem:   Acute CVA (cerebrovascular accident) (HCC) Active Problems:   LV (left ventricular) mural thrombus   Chronic systolic CHF (congestive heart failure) (HCC)   Prolonged QT interval   Elevated liver enzymes   COPD (chronic obstructive pulmonary disease) (HCC)   Cocaine abuse (HCC)   Schizoaffective disorder, bipolar type (HCC)   Benign essential HTN   Acid reflux   History of DVT (deep vein thrombosis)   Type 2 diabetes mellitus (HCC)   Status post bilateral BKA.   Dilated cardiomyopathy (HCC)  Acute left MCA stroke: Initially presented with slurred speech, confusion, right-sided weakness.  CT head showed left MCA infarct, M2 occlusion.  MRI showed left basal ganglia infract.  Has residual right-sided weakness, dysarthria from before.  Neurology was consulted.  Stroke workup completed.  Left ventricular/left atrial/right ventricular thrombus and right lower EXTR DVT: Noted on  echocardiogram.  Showed multiple clot addressed to the septum along with small right ventricular clot as well.  Initially it admitted to ICU, currently on anticoagulation.  Hypercoagulable workup showed mildly low Antithrombin activity, low protein C activity.  Cardiology was following, now signed off.  Currently on Eliquis .  Acute on chronic hypoxic respiratory failure: Chronically on 2 L of oxygen  per minute at home. Has been now on room air  Agitation/lack of capacity: Main issue currently.  Psychiatry following.  Meets criteria for IVC based on lack of insight, aggressive behavior.  Not on IVC right now but if he wants to leave, needs to put on IVC.  Intermittently refuses care.  Started on Latuda , gabapentin .  Debility/deconditioning: PT/OT recommending SNF on discharge.  Status right AKA,left BKA.He declines SNF.  Difficulty in disposition due to lack of capacity.  TOC following and assisting.  Contacting APS to provide report and discuss about guardianship  HFrEF: Echo showed EF less than 20%.  Cardiology was following.  Currently euvolemic.  Continue to monitor, Coreg , Lasix , losartan .  Coronary artery disease: Not a candidate for invasive workup due to extensive substance abuse.  Actively using cocaine.  Hypertension/hyperlipidemia: Continue current medications  Diabetes type 2: Currently on sliding scale.  Monitor blood sugars  Polysubstance abuse: Drug screen positive for cocaine, appears,benzos.  Counseled for cessation         DVT prophylaxis: apixaban  (ELIQUIS ) tablet 5 mg     Code Status: Full Code  Family Communication: None at the bedside  Patient status:Inpatient  Patient is from :home  Anticipated discharge to:SNF  Estimated DC date: When placement is found   Consultants: Neurology,  cardiology, psychiatry  Procedures: None  Antimicrobials:  Anti-infectives (From admission, onward)    None       Subjective: Patient seen and examined at bedside  today.  Comfortable lying on bed.  Not in any Distress.  Same like yesterday  Objective: Vitals:   03/24/24 0047 03/24/24 0445 03/24/24 0500 03/24/24 0840  BP: (!) 105/90 (!) 133/104  (!) 155/103  Pulse: 74 81  77  Resp: 20   18  Temp:    98.6 F (37 C)  TempSrc:      SpO2: 100% 100%    Weight:   42 kg   Height:        Intake/Output Summary (Last 24 hours) at 03/24/2024 1044 Last data filed at 03/23/2024 1745 Gross per 24 hour  Intake --  Output 800 ml  Net -800 ml   Filed Weights   03/22/24 0500 03/23/24 0425 03/24/24 0500  Weight: 39.6 kg 42 kg 42 kg    Examination:   General exam: Overall comfortable, not in distress HEENT: PERRL Respiratory system:  no wheezes or crackles  Cardiovascular system: S1 & S2 heard, RRR.  Gastrointestinal system: Abdomen is nondistended, soft and nontender. Central nervous system: Alert and awake, not oriented to time, dysarthria Extremities: Right AKA, left BKA Skin: No rashes, no ulcers,no icterus    Data Reviewed: I have personally reviewed following labs and imaging studies  CBC: Recent Labs  Lab 03/23/24 0612  WBC 5.0  HGB 15.3  HCT 46.9  MCV 92.7  PLT 284   Basic Metabolic Panel: Recent Labs  Lab 03/23/24 0612  NA 138  K 4.7  CL 104  CO2 25  GLUCOSE 101*  BUN 20  CREATININE 0.99  CALCIUM  9.2      No results found for this or any previous visit (from the past 240 hours).   Radiology Studies: No results found.  Scheduled Meds:  apixaban   5 mg Oral BID   arformoterol   15 mcg Nebulization BID   budesonide  (PULMICORT ) nebulizer solution  0.5 mg Nebulization BID   carvedilol   6.25 mg Oral BID WC   feeding supplement  237 mL Oral BID BM   furosemide   40 mg Oral Daily   gabapentin   200 mg Oral BID   haloperidol  lactate  5 mg Intramuscular Once   losartan   50 mg Oral Daily   lurasidone   40 mg Oral Q breakfast   nicotine   14 mg Transdermal Daily   pantoprazole   40 mg Oral Daily   revefenacin   175 mcg  Nebulization Daily   rosuvastatin   5 mg Oral QHS   spironolactone   12.5 mg Oral Daily   Continuous Infusions:   LOS: 20 days   Leona Rake, MD [facility] P4/24/2025, 10:44 AM

## 2024-03-24 NOTE — Consult Note (Signed)
 [facility] Psychiatric Consult Follow up  Patient Name: .[name]  MRN: [id number]  DOB: [birth year]  Consult Order details:  Orders (From admission, onward)     Start     Ordered   03/14/24 1515  IP CONSULT TO PSYCHIATRY       Ordering Provider: Trenton Frock, MD  Provider:  (Not yet assigned)  Question Answer Comment  Location [facility]   Reason for Consult? Capacity evaluation -- patient verbally abusive, wants to go home, but oriented only to self.  Lives alone, seems to be unable to care for himself at home.  Essentially refusing all cares here      03/14/24 1514             Mode of Visit: In person    Psychiatry Consult Evaluation  Service Date: March 24, 2024 LOS:  LOS: 20 days  Chief Complaint 58 year old male with a history of cardiomyopathy, cocaine abuse who presented with slurred speech and confusion and right-sided weakness due to a left MCA infarct.  Consult was requested because of patient's agitation, verbally abusive and refusing care.  Primary Psychiatric Diagnoses  Schizoaffective disorder, bipolar type by history. 2.  Substance use disorder, cocaine use disorder 3.  Acute agitation with limited cognitive abilities due to multiple strokes  Assessment  Axtyn Woehler is a 58 y.o. male admitted: Medicallyfor 03/04/2024  2:31 PM for weakness and dizziness due to a stroke. He carries the psychiatric diagnoses of schizoaffective disorder and cocaine use disorder, severe.  And has a past medical history of asthma, COPD, hypertension, recent CVA..    03/15/2024: His current presentation of agitation is most consistent with schizoaffective disorder and cocaine use disorder.Aaron Aas He meets criteria for IVC based on lack of insight and aggressive behavior..  Current outpatient psychotropic medications include Abilify  and historically he has had a positive response to these medications. He was noncompliant compliant with medications prior to  admission as evidenced by aggressive behavior and agitation. On initial examination, patient was belligerent, labile and refusing all medications.. Please see plan below for detailed recommendations.  When seen today the patient is very agitated, labile demanding to go home and refusing care and medical treatment.  His speech is slurred and is somewhat redirectable for brief periods of time but would not allow vital signs or take medications.  The plan is to initiate an IVC and consider nonemergency forced medications if appropriate.  We will continue to encourage him to cooperate since he has an ACTT team that he is quite involved with his care.  The patient was seen in his room and was noted to be laying in bed.  He is a bilateral amputee of both the legs.  He is awake and alert but extremely loud and belligerent blaming that he wants to go home and he does not want anyone to help him with any treatment.  He maintained fair eye contact.  His speech is rapid and loud and labile and although he denies any auditory or visual hallucinations he seems hypervigilant and guarded and unable to comprehend why he is here.  He was informed that he had a stroke and will need to be treated before discharge.  However he seems to lack insight and understand why he is here.  He continues to demand to leave.  He would not even allow the nurse to do some vital signs. At this point one of the ACT team workers will obtain and the patient recognized him  and claims that he wants to go home with him.  We contacted the ACT team to obtain a list of his medications.  03/16/2024: The patient was seen and reevaluated today.  The chart was reviewed.  The patient remains labile and partially cooperative.  He has refused occupational therapy evaluation and was also seen by the hospitalist and the ACTT team.  With the redirection persuasion patient seems to comply with treatment.  He is back on his home medications which he is taking.  On  examination the patient is alert oriented and initially cooperative but with a further discussion he escalated in his behavior demanding to be discharged today and claiming that he has his own apartment and that he never really had a stroke.  He lacks insight and at his baseline tends to be argumentative and belligerent.  However, he is improved compared to yesterday.  Strongly encouraged him to continue with the medications and use PRNs as indicated.  He would benefit from APS services and probably a guardian given his history of multiple strokes and lack of insight.  03/18/2024 The client was watching television prior to the assessment.  He stated, "I'm fine".  When asked about his refusal to take Ability, he said, "I don't want to.  If I don't take it, I don't take it."  The sitter had no issues with behaviors today.  He requested ice cream and graham crackers which she went to retrieve.  Discussed other medications since he does not want the Abilify  and he agreed to Latuda  and gabapentin , started with Latuda  40 mg daily, discontinued the Abilify  and began gabapentin  200 mg BID to assist with his mood.   03/20/2024 Patient seen face to face in his hospital room. He was lying comfortably in bed and watching TV when this Clinical research associate entered his room. Patient does not appear to be in any acute distress initially, but became irritable and dismissive as soon as he saw this Clinical research associate. He refused to answer when asked how he has been doing lately and increased the volume of the TV when asked further questions saying "I've told you all that I'm doing okay and I don't need medications." Of note, patient has been refusing medications on this admission as confirmed from previous documentation.   03/21/2024: The patient was approached by the psychiatry consult team this morning but declined to participate, stating  he did not wish to speak with us  and asked the team to leave the room.  He has continued to refuse his  scheduled latuda .  Irritability and dismissive demeanor appear to be unchanged.  03/24/2024: Patient was previously unwilling to engage with the psychiatry team but was pleasant and cooperative during today's evaluation. He appears to have been compliant with Latuda  over the past two days, which may be contributing to improved engagement and behavioral stability. Disposition planning remains challenging, and TOC is actively involved in identifying appropriate placement.  From a psychiatric standpoint, there are no acute concerns at this time, and no further intervention is indicated. We are signing off with recommendation for continued medication adherence and follow-up as needed. Psychiatry remains available for reconsultation.  Diagnoses:  Active Hospital problems: Principal Problem:   Acute CVA (cerebrovascular accident) [facility]) Active Problems:   Schizoaffective disorder, bipolar type (HCC)   Cocaine abuse (HCC)   Type 2 diabetes mellitus (HCC)   Prolonged QT interval   Acid reflux   History of DVT (deep vein thrombosis)   Status post bilateral BKA.   COPD (  chronic obstructive pulmonary disease) (HCC)   Chronic systolic CHF (congestive heart failure) (HCC)   Benign essential HTN   LV (left ventricular) mural thrombus   Elevated liver enzymes   Dilated cardiomyopathy (HCC)    Plan   ## Psychiatric Medication Recommendations:  Abilify  discontinued as the patient continues to refuse it -Continue Latuda  40 mg daily -Continue Gabapentin  200 mg BID  ## Medical Decision Making Capacity:  Patient is his own guardian. Strongly suggest contacting APS services and may be initiating guardianship  ## Further Work-up:  -- None EKG -- most recent EKG on 03/10/2024 had QtC of 483.  However he has been on Abilify  for a long time and is EKG from February had a QTc of 489. -- Pertinent labwork reviewed earlier this admission includes: Continue with monitoring QTc intervals.   ##  Disposition: -- Follow up with ACT team  ## Behavioral / Environmental: -Difficult Patient (SELECT OPTIONS FROM BELOW), Recommend using specific terminology regarding PNES, i.e. call the episodes "non-epileptic seizures" rather than "pseudoseizures" as the latter insinuates "fake" or "feigned" symptoms, when the events are a very real experience to the patient and are a physical, non-volitional, manifestation of fear, pain and anxiety. , or Utilize compassion and acknowledge the patient's experiences while setting clear and realistic expectations for care.    ## Safety and Observation Level:  - Based on my clinical evaluation, I estimate the patient to be at low risk of self harm in the current setting. - At this time, we recommend  routine. This decision is based on my review of the chart including patient's history and current presentation, interview of the patient, mental status examination, and consideration of suicide risk including evaluating suicidal ideation, plan, intent, suicidal or self-harm behaviors, risk factors, and protective factors. This judgment is based on our ability to directly address suicide risk, implement suicide prevention strategies, and develop a safety plan while the patient is in the clinical setting. Please contact our team if there is a concern that risk level has changed.  CSSR Risk Category:C-SSRS RISK CATEGORY: No Risk  Suicide Risk Assessment: Patient has following modifiable risk factors for suicide: recklessness and medication noncompliance, which we are addressing by adding medications, using as needed antipsychotics and patient will require a nonemergency forced medication.. Patient has following non-modifiable or demographic risk factors for suicide: male gender, history of suicide attempt, history of self harm behavior, and psychiatric hospitalization Patient has the following protective factors against suicide: Access to outpatient mental health  care  Thank you for this consult request. Recommendations have been communicated to the primary team.  We will sign off.  At this time.   Baltazar Bonier, MD       History of Present Illness  Relevant Aspects of [facility] Course:  Admitted on 03/04/2024 for weakness and dizziness as a result of a CVA.Aaron Aas   Patient Report:  Attempted to assess patient at bedside, when greeted the patient demanded that we leave the room.  He states "I am not going to speak with anyone from psychiatry, get out of my room".  Psych ROS:  Depression: Denied Anxiety: Appears anxious Mania (lifetime and current): Past history Psychosis: (lifetime and current): Past history  Collateral information:  Contacted ACTT at [facility] on 03/15/2024.  The ACT team came to visit him and following him while in the hospital.  Review of Systems  All other systems reviewed and are negative.    Psychiatric and Social History  Psychiatric History:  Information collected from records  Prev Dx/Sx: Schizoaffective disorder Current Psych Provider: ACT team Home Meds (current): Abilify  5 mg twice a day and hydroxyzine  25 mg every 6 hours as needed Previous Med Trials: Multiple trials Therapy: Currently followed up ACT team  Prior Psych Hospitalization: Last hospitalization was at [facility] in October 2024 Prior Self Harm: History of suicidal behavior Prior Violence: History of agitation and noncompliance  Family Psych History: Unknown Family Hx suicide: Unknown  Social History:  Developmental Hx: Please see records. Educational Hx: Unknown Occupational Hx: Unknown Legal Hx: None noted Living Situation: Patient has his own apartment Spiritual Hx: Unknown Access to weapons/lethal means: Unknown  Substance History Alcohol: History of alcohol use Type of alcohol unknown Last Drink unknown Number of drinks per day unknown History of alcohol withdrawal seizures unknown History of DT's past  history Tobacco: Unknown Illicit drugs: Severe cocaine use disorder Prescription drug abuse: None Rehab hx: In the past.  Exam Findings  Physical Exam: Please see the hospitalist note for an H&P Vital Signs:  Temp:  [97.5 F (36.4 C)-98.6 F (37 C)] 98.6 F (37 C) (04/24 0840) Pulse Rate:  [64-81] 77 (04/24 0840) Resp:  [18-20] 18 (04/24 0840) BP: (105-155)/(72-104) 155/103 (04/24 0840) SpO2:  [99 %-100 %] 100 % (04/24 0445) Weight:  [42 kg] 42 kg (04/24 0500) Blood pressure (!) 155/103, pulse 77, temperature 98.6 F (37 C), resp. rate 18, height 5\' 9"  (1.753 m), weight 42 kg, SpO2 100%. Body mass index is 13.67 kg/m.  Physical Exam HENT:     Head: Normocephalic and atraumatic.  Pulmonary:     Effort: Pulmonary effort is normal. No respiratory distress.  Skin:    General: Skin is warm and dry.  Neurological:     Mental Status: He is alert.     Comments: Word finding difficulties noticed, unchanged from prior     Mental Status Exam: General Appearance: Casual  Orientation:  Full (Time, Place, and Person)  Memory:  Recent;   Fair  Concentration:  Fair  Recall:  Fair  Attention  Fair  Eye Contact:  Good  Speech:  WDL  Language:  Fair  Volume:  WDL  Mood: "Better"  Affect:  Congruent and Full Range  Thought Process:  Coherent and Linear  Thought Content:  Logical, no evidence of delusions or hallucinations  Suicidal Thoughts:  No  Homicidal Thoughts:  No  Judgement:  Fair to poor  Insight:  Shallow  Psychomotor Activity:  WDL  Akathisia:  No  Fund of Knowledge:  Fair      Assets:  Housing Resilience Social Support  Cognition:  WNL and Impaired,  Mild  ADL's:  Impaired  AIMS (if indicated):        Other History   These have been pulled in through the EMR, reviewed, and updated if appropriate.  Family History:  The patient's family history includes Drug abuse in his mother; Suicidality in his mother.  Medical History: Past Medical History:   Diagnosis Date   Asthma    Bipolar 1 disorder (HCC)    CHF (congestive heart failure) (HCC)    COPD (chronic obstructive pulmonary disease) (HCC)    Depression    Diabetes mellitus without complication (HCC)    GERD (gastroesophageal reflux disease)    Homelessness    Hypertension    QT prolongation    Schizoaffective disorder [facility])     Surgical History: Past Surgical History:  Procedure Laterality Date   AMPUTATION Left 12/10/2022  Procedure: LEFT BELOW KNEE AMPUTATION;  Surgeon: Timothy Ford, MD;  Location: [facility] OR;  Service: Orthopedics;  Laterality: Left;   AMPUTATION TOE Bilateral 01/02/2021   Procedure: AMPUTATION TOE;  Surgeon: Camilo Cella, DPM;  Location: [facility] OR;  Service: Podiatry;  Laterality: Bilateral;  Ok to leave patient on hospital bed   IR ANGIO INTRA EXTRACRAN SEL COM CAROTID INNOMINATE BILAT MOD SED  12/17/2020   IR ANGIO VERTEBRAL SEL VERTEBRAL BILAT MOD SED  12/17/2020   MULTIPLE EXTRACTIONS WITH ALVEOLOPLASTY N/A 03/25/2023   Procedure: IRRIGATION AND DEBRIDEMENT, MULTIPLE EXTRACTION WITH ALVEOLOPLASTY X 4 , EXTRACTIONS 4 THROUGH 13, 20 THROUGHT 29;  Surgeon: Joseph Nickel, DMD;  Location: [facility] OR;  Service: Oral Surgery;  Laterality: N/A;   RADIOLOGY WITH ANESTHESIA N/A 12/17/2020   Procedure: IR WITH ANESTHESIA;  Surgeon: Radiologist, Medication, MD;  Location: [facility] OR;  Service: Radiology;  Laterality: N/A;     Medications:   Current Facility-Administered Medications:    albuterol  (PROVENTIL ) (2.5 MG/3ML) 0.083% nebulizer solution 3 mL, 3 mL, Nebulization, Q6H PRN, Desai, Nikita S, MD, 3 mL at 03/22/24 0543   apixaban  (ELIQUIS ) tablet 5 mg, 5 mg, Oral, BID, Millen, Jessica B, RPH, 5 mg at 03/24/24 0815   arformoterol  (BROVANA ) nebulizer solution 15 mcg, 15 mcg, Nebulization, BID, Desai, Nikita S, MD, 15 mcg at 03/24/24 8295   budesonide  (PULMICORT ) nebulizer solution 0.5 mg, 0.5 mg, Nebulization, BID, Desai, Nikita S, MD, 0.5 mg at 03/24/24 6213   carvedilol   (COREG ) tablet 6.25 mg, 6.25 mg, Oral, BID WC, Hilty, Aviva Lemmings, MD, 6.25 mg at 03/24/24 0815   feeding supplement (ENSURE ENLIVE / ENSURE PLUS) liquid 237 mL, 237 mL, Oral, BID BM, Pahwani, Ravi, MD, 237 mL at 03/23/24 1041   furosemide  (LASIX ) tablet 40 mg, 40 mg, Oral, Daily, [place], [place], [place], 40 mg at 03/24/24 0815   gabapentin  (NEURONTIN ) capsule 200 mg, 200 mg, Oral, BID, Lord, Jamison Y, NP, 200 mg at 03/24/24 0865   haloperidol  lactate (HALDOL ) injection 5 mg, 5 mg, Intramuscular, Once, Adhikari, Amrit, MD   losartan  (COZAAR ) tablet 50 mg, 50 mg, Oral, Daily, Hilty, Aviva Lemmings, MD, 50 mg at 03/24/24 0815   lurasidone  (LATUDA ) tablet 40 mg, 40 mg, Oral, Q breakfast, Lord, Jamison Y, NP, 40 mg at 03/24/24 0815   nicotine  (NICODERM CQ  - dosed in mg/24 hours) patch 14 mg, 14 mg, Transdermal, Daily, Desai, Nikita S, MD, 14 mg at 03/13/24 0950   oxyCODONE  (Oxy IR/ROXICODONE ) immediate release tablet 5 mg, 5 mg, Oral, Q4H PRN, Pokhrel, Laxman, MD, 5 mg at 03/24/24 1228   pantoprazole  (PROTONIX ) EC tablet 40 mg, 40 mg, Oral, Daily, Reome, Earle J, RPH, 40 mg at 03/24/24 0815   revefenacin  (YUPELRI ) nebulizer solution 175 mcg, 175 mcg, Nebulization, Daily, Desai, Nikita S, MD, 175 mcg at 03/24/24 7846   rosuvastatin  (CRESTOR ) tablet 5 mg, 5 mg, Oral, QHS, Desai, Nikita S, MD, 5 mg at 03/23/24 2251   spironolactone  (ALDACTONE ) tablet 12.5 mg, 12.5 mg, Oral, Daily, Hazle Lites, MD, 12.5 mg at 03/24/24 0815  Allergies: Allergies  Allergen Reactions   Acetaminophen  Hives, Nausea And Vomiting, Rash and Anaphylaxis    PT states that if he takes this he is going to the hospital   Motrin  [Ibuprofen ] Anaphylaxis   Nsaids Anaphylaxis    PT states that if he takes this he is going to the hospital   Lyrica  Cr [Pregabalin  Er] Other (See Comments)    Dystonia Dysarthria  Peanut-Containing Drug Products Other (See Comments)    Pt denies allergy to peanut butter 03/02/2021    Baltazar Bonier,  MD

## 2024-03-24 NOTE — Progress Notes (Signed)
 Occupational Therapy Treatment Patient Details Name: [name] MRN: [id number] DOB: December 21, 1965 Today's Date: 03/24/2024   History of present illness Patient is 58 y.o. male presented to ED on 03/04/24 with stroke like symptoms when caregiver found him with slurred speech and right sided weakness. Head CT reveals Acute left MCA territory infarct affecting the insula, caudate nucleus, lentiform nucleus and internal capsule. PMH significant of  Schizoaffective disorder, heart failure with reduced ejection fraction, pretension, DMII, COPD, s/p bilateral amputations, chronic pain, and polysubstance abuse.   OT comments  Pt completing transfer to w/c with CGA, he refuses therapists attempts to help and pt demonstrating lack of safety awareness needing several cues. Pt also noted to have periods of agitation and seems to have behavioral impairments. RUE WFL, unsure if this is pt baseline but he could DC from OT services if it is. At this time OT will continue to monitor for a couple more sessions while in acute setting. Patient will benefit from continued inpatient follow up therapy, <3 hours/day.       If plan is discharge home, recommend the following:  A little help with walking and/or transfers;A little help with bathing/dressing/bathroom;Supervision due to cognitive status   Equipment Recommendations  None recommended by OT    Recommendations for Other Services      Precautions / Restrictions Precautions Precautions: Fall Recall of Precautions/Restrictions: Impaired Precaution/Restrictions Comments: h/o BLE amputations Restrictions Weight Bearing Restrictions Per Provider Order: No       Mobility Bed Mobility Overal bed mobility: Modified Independent Bed Mobility: Supine to Sit, Sit to Supine Rolling: Modified independent (Device/Increase time), Used rails   Supine to sit: Modified independent (Device/Increase time) Sit to supine: Modified independent (Device/Increase time)         Transfers Overall transfer level: Needs assistance Equipment used: None Transfers: Bed to chair/wheelchair/BSC            Lateral/Scoot Transfers: Contact guard assist General transfer comment: CGA and verbal cues for safety with locking brakes and setup of w/c     Balance Overall balance assessment: Needs assistance Sitting-balance support: Single extremity supported, Feet unsupported Sitting balance-Leahy Scale: Good Sitting balance - Comments: sitting EOB                                   ADL either performed or assessed with clinical judgement   ADL   Eating/Feeding: Independent;Bed level   Grooming: Sitting;Set up   Upper Body Bathing: Bed level;Set up   Lower Body Bathing: Set up;Bed level                              Extremity/Trunk Assessment Upper Extremity Assessment Upper Extremity Assessment: Right hand dominant;RUE deficits/detail RUE Deficits / Details: RUE and strenght WFL, coordination intact            Vision   Vision Assessment?: No apparent visual deficits   Perception     Praxis     Communication Communication Communication: Impaired Factors Affecting Communication: Difficulty expressing self;Reduced clarity of speech (dysarthric)   Cognition Arousal: Alert Behavior During Therapy: Impulsive Cognition: Cognition impaired     Awareness: Intellectual awareness impaired, Online awareness impaired     Executive functioning impairment (select all impairments): Reasoning, Problem solving OT - Cognition Comments: Pt with impaired safety awareness and lack of insight into deficits.  Following commands: Impaired Following commands impaired: Follows one step commands inconsistently, Follows one step commands with increased time      Cueing   Cueing Techniques: Verbal cues, Tactile cues, Gestural cues  Exercises      Shoulder Instructions       General Comments VSS     Pertinent Vitals/ Pain       Pain Assessment Pain Assessment: No/denies pain  Home Living                                          Prior Functioning/Environment              Frequency  Min 1X/week        Progress Toward Goals  OT Goals(current goals can now be found in the care plan section)  Progress towards OT goals: Progressing toward goals  Acute Rehab OT Goals Patient Stated Goal: To get out of here OT Goal Formulation: With patient Time For Goal Achievement: 04/07/24 Potential to Achieve Goals: Fair ADL Goals Pt Will Perform Lower Body Dressing: with modified independence;bed level Additional ADL Goal #1: Pt will transfer to w/c with Mod I as a precursor for OOB ADLs Additional ADL Goal #3: Pt will demonstrate proper safety with transfers/mobility 100% of the time during session.  Plan      Co-evaluation    PT/OT/SLP Co-Evaluation/Treatment: Yes Reason for Co-Treatment: Necessary to address cognition/behavior during functional activity;Complexity of the patient's impairments (multi-system involvement) PT goals addressed during session: Mobility/safety with mobility OT goals addressed during session: ADL's and self-care;Other (comment) (safety)      AM-PAC OT "6 Clicks" Daily Activity     Outcome Measure   Help from another person eating meals?: None Help from another person taking care of personal grooming?: A Little Help from another person toileting, which includes using toliet, bedpan, or urinal?: A Lot Help from another person bathing (including washing, rinsing, drying)?: A Little Help from another person to put on and taking off regular upper body clothing?: A Little Help from another person to put on and taking off regular lower body clothing?: A Little 6 Click Score: 18    End of Session Equipment Utilized During Treatment: Other (comment) (w/c)  OT Visit Diagnosis: Unsteadiness on feet (R26.81);Other abnormalities of  gait and mobility (R26.89);Muscle weakness (generalized) (M62.81);Other symptoms and signs involving the nervous system (R29.898);Other symptoms and signs involving cognitive function;Hemiplegia and hemiparesis Hemiplegia - Right/Left: Right Hemiplegia - dominant/non-dominant: Dominant Hemiplegia - caused by: Cerebral infarction   Activity Tolerance Patient tolerated treatment well   Patient Left in bed;with call bell/phone within reach;Other (comment) (MS and PT in room)   Nurse Communication Mobility status        Time: [phone] OT Time Calculation (min): 28 min  Charges: OT General Charges $OT Visit: 1 Visit OT Treatments $Therapeutic Activity: 8-22 mins  03/24/2024  AB, OTR/L  Acute Rehabilitation Services  Office: [phone]   [name] 03/24/2024, 5:14 PM

## 2024-03-24 NOTE — TOC Progression Note (Signed)
 Transition of Care [facility]) - Progression Note    Patient Details  Name: [name] MRN: [id number] Date of Birth: 16-Sep-1966  Transition of Care [facility]) CM/SW Contact  Elspeth Hals, [name] Phone Number: 03/24/2024, 3:27 PM  Clinical Narrative:   CSW asked to call pt HH aide, Rosalynd Combs, [phone], [phone].  CSW spoke with Brian Campanile, who is asking for information about assisting pt to get his rent and light bill paid.  Per Brian Campanile, she is authorized for 70 hours per month for [employer] and she provides services daily for 2.5 hours.  She has been working with him for the past month, said things have been going well during that time prior to the stroke.  She is able to assist with getting rent paid if she needs to physically go pay it.  CSW agreed to speak with pt.  1430: CSW spoke with [name], who reports rent is due on the 1st of the month and his disability money also comes on the 1st of the month.  He will call Brian Campanile to talk about how to get it paid.    CSW called [facility] APS for update, was directed to APS worker Sofia Dunn, CSW left message.      Expected Discharge Plan: Skilled Nursing Facility Barriers to Discharge: Continued Medical Work up, English as a second language teacher, Active Substance Use - Placement, Inadequate or no insurance, SNF Pending payor source - LOG, SNF Pending Medicaid, SNF Pending bed offer  Expected Discharge Plan and Services       Living arrangements for the past 2 months: Apartment Expected Discharge Date: 03/14/24                                     Social Determinants of Health (SDOH) Interventions SDOH Screenings   Food Insecurity: Food Insecurity Present (03/06/2024)  Housing: High Risk (03/06/2024)  Transportation Needs: No Transportation Needs (03/06/2024)  Utilities: Patient Declined (03/06/2024)  Alcohol Screen: Medium Risk (06/24/2019)  Depression (PHQ2-9): Low Risk  (02/18/2024)  Recent Concern: Depression (PHQ2-9) - Medium Risk (01/21/2024)   Financial Resource Strain: High Risk (07/24/2021)   Received from [facility], [facility]  Physical Activity: Inactive (02/18/2024)  Social Connections: Unknown (04/05/2022)   Received from [facility], [facility]  Stress: Stress Concern Present (07/24/2021)   Received from [facility], [facility]  Tobacco Use: High Risk (03/04/2024)  Health Literacy: Adequate Health Literacy (02/18/2024)    Readmission Risk Interventions    12/21/2023    9:25 AM 12/09/2022    9:02 AM 12/01/2022    3:56 PM  Readmission Risk Prevention Plan  Transportation Screening Complete Complete Complete  HRI or Home Care Consult Complete    Social Work Consult for Recovery Care Planning/Counseling Complete    Palliative Care Screening Not Applicable    Medication Review Oceanographer) Complete Complete Complete  PCP or Specialist appointment within 3-5 days of discharge  Complete Complete  HRI or Home Care Consult  Complete Complete  SW Recovery Care/Counseling Consult  Complete Complete  Palliative Care Screening  Not Applicable Not Applicable  Skilled Nursing Facility  Not Applicable Not Applicable  SNF Comments   n/a

## 2024-03-24 NOTE — Telephone Encounter (Signed)
 Patient appointment is cancelled due to being hospitalized.

## 2024-03-25 DIAGNOSIS — I639 Cerebral infarction, unspecified: Secondary | ICD-10-CM | POA: Diagnosis not present

## 2024-03-25 NOTE — Progress Notes (Signed)
 PROGRESS NOTE  [name]  ZOX:[id number] DOB: May 04, 1966 DOA: 03/04/2024 PCP: Abraham Abo, MD   Brief Narrative: Patient is a 57-year male with history of cardiomyopathy, current cocaine abuse who presented with complaint of slurred speech, confusion, right-sided weakness.  Code stroke was called.  CT head showed left MCA infarct, CTA head and neck showed M2 occlusion on the left.  Patient was admitted for stroke workup.  Neurology consulted.  Echo showed large mobile left ventricular thrombus, left atrial thrombus, right ventricular apical thrombus with EF of 20%.  Cardiology involved.  Left ventricular thrombus was more than 3 cm in size.  Patient transferred out of ICU on 03/07/2024 to TRH service.  Stroke workup completed.  PT/OT, SNF on discharge but he wants to go home.He has poor support, his bilateral BKA and also  lacks capacity, psychiatry consulted. He cannot be discharged to home. TOC closely assisting.  Might end up with prolonged hospitalization  Assessment & Plan:  Principal Problem:   Acute CVA (cerebrovascular accident) (HCC) Active Problems:   LV (left ventricular) mural thrombus   Chronic systolic CHF (congestive heart failure) (HCC)   Prolonged QT interval   Elevated liver enzymes   COPD (chronic obstructive pulmonary disease) (HCC)   Cocaine abuse (HCC)   Schizoaffective disorder, bipolar type (HCC)   Benign essential HTN   Acid reflux   History of DVT (deep vein thrombosis)   Type 2 diabetes mellitus (HCC)   Status post bilateral BKA.   Dilated cardiomyopathy (HCC)  Acute left MCA stroke: Initially presented with slurred speech, confusion, right-sided weakness.  CT head showed left MCA infarct, M2 occlusion.  MRI showed left basal ganglia infract.  Has residual right-sided weakness, dysarthria from before.  Neurology was consulted.  Stroke workup completed.  Left ventricular/left atrial/right ventricular thrombus and right lower EXTR DVT: Noted on  echocardiogram.  Showed multiple clot addressed to the septum along with small right ventricular clot as well.  Initially it admitted to ICU, currently on anticoagulation.  Hypercoagulable workup showed mildly low Antithrombin activity, low protein C activity.  Cardiology was following, now signed off.  Currently on Eliquis .  Acute on chronic hypoxic respiratory failure: Chronically on 2 L of oxygen  per minute at home.   Agitation/lack of capacity: Main issue currently.  Psychiatry following.  Meets criteria for IVC based on lack of insight, aggressive behavior.  Not on IVC right now but if he wants to leave, needs to put on IVC.  Intermittently refuses care.  Started on Latuda , gabapentin .  Debility/deconditioning: PT/OT recommending SNF on discharge.  Status right AKA,left BKA.He declines SNF.  Difficulty in disposition due to lack of capacity.  TOC following and assisting.  Contacting APS to provide report and discuss about guardianship  HFrEF: Echo showed EF less than 20%.  Cardiology was following.  Currently euvolemic.  Continue to monitor, Coreg , Lasix , losartan .  Coronary artery disease: Not a candidate for invasive workup due to extensive substance abuse.  Actively using cocaine.  Hypertension/hyperlipidemia: Continue current medications  Diabetes type 2: Currently on sliding scale.  Monitor blood sugars  Polysubstance abuse: Drug screen positive for cocaine, appears,benzos.  Counseled for cessation         DVT prophylaxis: apixaban  (ELIQUIS ) tablet 5 mg     Code Status: Full Code  Family Communication: None at the bedside  Patient status:Inpatient  Patient is from :home  Anticipated discharge to:SNF  Estimated DC date: When placement is found   Consultants: Neurology, cardiology, psychiatry  Procedures: None  Antimicrobials:  Anti-infectives (From admission, onward)    None       Subjective: Patient seen and examined at bedside today.  Very comfortable.   Sitting on the bed.  Alert and awake, and communicates well but has severe dysarthria.  Denies any new complaints  Objective: Vitals:   03/24/24 0840 03/24/24 1958 03/24/24 2048 03/25/24 0050  BP: (!) 155/103  105/84   Pulse: 77 82 74   Resp: 18 18    Temp: 98.6 F (37 C)     TempSrc:      SpO2:  99% 97% 100%  Weight:      Height:        Intake/Output Summary (Last 24 hours) at 03/25/2024 1142 Last data filed at 03/24/2024 2200 Gross per 24 hour  Intake 195 ml  Output 1050 ml  Net -855 ml   Filed Weights   03/22/24 0500 03/23/24 0425 03/24/24 0500  Weight: 39.6 kg 42 kg 42 kg    Examination:     General exam: Overall comfortable, not in distress HEENT: PERRL Respiratory system:  no wheezes or crackles  Cardiovascular system: S1 & S2 heard, RRR.  Gastrointestinal system: Abdomen is nondistended, soft and nontender. Central nervous system: Alert and awake, oriented to place Extremities: Right AKA, left BKA Skin: No rashes, no ulcers,no icterus    Data Reviewed: I have personally reviewed following labs and imaging studies  CBC: Recent Labs  Lab 03/23/24 0612  WBC 5.0  HGB 15.3  HCT 46.9  MCV 92.7  PLT 284   Basic Metabolic Panel: Recent Labs  Lab 03/23/24 0612  NA 138  K 4.7  CL 104  CO2 25  GLUCOSE 101*  BUN 20  CREATININE 0.99  CALCIUM  9.2      No results found for this or any previous visit (from the past 240 hours).   Radiology Studies: No results found.  Scheduled Meds:  apixaban   5 mg Oral BID   arformoterol   15 mcg Nebulization BID   budesonide  (PULMICORT ) nebulizer solution  0.5 mg Nebulization BID   carvedilol   6.25 mg Oral BID WC   feeding supplement  237 mL Oral BID BM   furosemide   40 mg Oral Daily   gabapentin   200 mg Oral BID   haloperidol  lactate  5 mg Intramuscular Once   losartan   50 mg Oral Daily   lurasidone   40 mg Oral Q breakfast   nicotine   14 mg Transdermal Daily   pantoprazole   40 mg Oral Daily   revefenacin    175 mcg Nebulization Daily   rosuvastatin   5 mg Oral QHS   spironolactone   12.5 mg Oral Daily   Continuous Infusions:   LOS: 21 days   Leona Rake, MD [facility] P4/25/2025, 11:42 AM

## 2024-03-26 DIAGNOSIS — I639 Cerebral infarction, unspecified: Secondary | ICD-10-CM | POA: Diagnosis not present

## 2024-03-26 NOTE — Plan of Care (Signed)
  Problem: Education: Goal: Knowledge of disease or condition will improve Outcome: Progressing   Problem: Self-Care: Goal: Verbalization of feelings and concerns over difficulty with self-care will improve Outcome: Progressing   Problem: Nutrition: Goal: Dietary intake will improve Outcome: Progressing

## 2024-03-26 NOTE — Plan of Care (Signed)
  Problem: Education: Goal: Knowledge of disease or condition will improve 03/26/2024 1604 by Keilin Gamboa, Erick Hausen, RN Outcome: Progressing 03/26/2024 1011 by Johnanna Mylar, RN Outcome: Progressing Goal: Knowledge of secondary prevention will improve (MUST DOCUMENT ALL) Outcome: Progressing   Problem: Self-Care: Goal: Verbalization of feelings and concerns over difficulty with self-care will improve Outcome: Progressing   Problem: Nutrition: Goal: Risk of aspiration will decrease Outcome: Progressing Goal: Dietary intake will improve Outcome: Progressing

## 2024-03-26 NOTE — Progress Notes (Signed)
 PROGRESS NOTE  [name]  ZOX:[id number] DOB: [birth year] DOA: 03/04/2024 PCP: Abraham Abo, MD   Brief Narrative: Patient is a 57-year male with history of cardiomyopathy, current cocaine abuse who presented with complaint of slurred speech, confusion, right-sided weakness.  Code stroke was called.  CT head showed left MCA infarct, CTA head and neck showed M2 occlusion on the left.  Patient was admitted for stroke workup.  Neurology consulted.  Echo showed large mobile left ventricular thrombus, left atrial thrombus, right ventricular apical thrombus with EF of 20%.  Cardiology involved.  Left ventricular thrombus was more than 3 cm in size.  Patient transferred out of ICU on 03/07/2024 to TRH service.  Stroke workup completed.  PT/OT, SNF on discharge but he wants to go home.He has poor support, his bilateral BKA and also  lacks capacity, psychiatry consulted. He cannot be discharged to home. TOC closely assisting.  Might end up with prolonged hospitalization  Assessment & Plan:  Principal Problem:   Acute CVA (cerebrovascular accident) (HCC) Active Problems:   LV (left ventricular) mural thrombus   Chronic systolic CHF (congestive heart failure) (HCC)   Prolonged QT interval   Elevated liver enzymes   COPD (chronic obstructive pulmonary disease) (HCC)   Cocaine abuse (HCC)   Schizoaffective disorder, bipolar type (HCC)   Benign essential HTN   Acid reflux   History of DVT (deep vein thrombosis)   Type 2 diabetes mellitus (HCC)   Status post bilateral BKA.   Dilated cardiomyopathy (HCC)  Acute left MCA stroke: Initially presented with slurred speech, confusion, right-sided weakness.  CT head showed left MCA infarct, M2 occlusion.  MRI showed left basal ganglia infract.  Has residual right-sided weakness, dysarthria from before.  Neurology was consulted.  Stroke workup completed.  Left ventricular/left atrial/right ventricular thrombus and right lower EXTR DVT: Noted on  echocardiogram.  Showed multiple clot addressed to the septum along with small right ventricular clot as well.  Initially it admitted to ICU, currently on anticoagulation.  Hypercoagulable workup showed mildly low Antithrombin activity, low protein C activity.  Cardiology was following, now signed off.  Currently on Eliquis .  Acute on chronic hypoxic respiratory failure: Chronically on 2 L of oxygen  per minute at home.   Agitation/lack of capacity: Main issue currently.  Psychiatry following.  Meets criteria for IVC based on lack of insight, aggressive behavior.  Not on IVC right now but if he wants to leave, needs to put on IVC.  Intermittently refuses care.  Started on Latuda , gabapentin .  Debility/deconditioning: PT/OT recommending SNF on discharge.  Status right AKA,left BKA.He declines SNF.  Difficulty in disposition due to lack of capacity.  TOC following and assisting.  Contacted APS for  guardianship  HFrEF: Echo showed EF less than 20%.  Cardiology was following.    Continue to monitor, Coreg , Lasix , losartan .  Currently euvolemic  Coronary artery disease: Not a candidate for invasive workup due to extensive substance abuse.  Was actively using cocaine.  Hypertension/hyperlipidemia: Continue current medications  Diabetes type 2: Currently on sliding scale.  Monitor blood sugars  Polysubstance abuse: Drug screen positive for cocaine, appears,benzos.  Counseled for cessation         DVT prophylaxis: apixaban  (ELIQUIS ) tablet 5 mg     Code Status: Full Code  Family Communication: None at the bedside  Patient status:Inpatient  Patient is from :home  Anticipated discharge to:SNF  Estimated DC date: When placement is found   Consultants: Neurology, cardiology, psychiatry  Procedures: None  Antimicrobials:  Anti-infectives (From admission, onward)    None       Subjective: Patient seen and examined at bedside today.  Hemodynamically stable.  Comfortable.  Lying  on bed.  Frustrated because he has been here and wants to go home.  Not in any distress.  Has chronic dysarthria . He moves his upper extremities for without any problems  Objective: Vitals:   03/25/24 1327 03/25/24 2003 03/25/24 2010 03/26/24 0431  BP: 135/85 122/67  139/89  Pulse: (!) 55 71 72 76  Resp: 18 18 18 18   Temp: 98.6 F (37 C) 98.6 F (37 C)    TempSrc:  Oral    SpO2: (!) 84% 100% 100% 100%  Weight:    42.7 kg  Height:        Intake/Output Summary (Last 24 hours) at 03/26/2024 1036 Last data filed at 03/26/2024 0800 Gross per 24 hour  Intake 766 ml  Output 700 ml  Net 66 ml   Filed Weights   03/23/24 0425 03/24/24 0500 03/26/24 0431  Weight: 42 kg 42 kg 42.7 kg    Examination:    General exam: Overall comfortable, not in distress HEENT: PERRL Respiratory system:  no wheezes or crackles  Cardiovascular system: S1 & S2 heard, RRR.  Gastrointestinal system: Abdomen is nondistended, soft and nontender. Central nervous system: Alert and awake, oriented to place, has dysarthria Extremities: Right AKA, left BKA Skin: No rashes, no ulcers,no icterus     Data Reviewed: I have personally reviewed following labs and imaging studies  CBC: Recent Labs  Lab 03/23/24 0612  WBC 5.0  HGB 15.3  HCT 46.9  MCV 92.7  PLT 284   Basic Metabolic Panel: Recent Labs  Lab 03/23/24 0612  NA 138  K 4.7  CL 104  CO2 25  GLUCOSE 101*  BUN 20  CREATININE 0.99  CALCIUM  9.2      No results found for this or any previous visit (from the past 240 hours).   Radiology Studies: No results found.  Scheduled Meds:  apixaban   5 mg Oral BID   arformoterol   15 mcg Nebulization BID   budesonide  (PULMICORT ) nebulizer solution  0.5 mg Nebulization BID   carvedilol   6.25 mg Oral BID WC   feeding supplement  237 mL Oral BID BM   furosemide   40 mg Oral Daily   gabapentin   200 mg Oral BID   haloperidol  lactate  5 mg Intramuscular Once   losartan   50 mg Oral Daily    lurasidone   40 mg Oral Q breakfast   nicotine   14 mg Transdermal Daily   pantoprazole   40 mg Oral Daily   revefenacin   175 mcg Nebulization Daily   rosuvastatin   5 mg Oral QHS   spironolactone   12.5 mg Oral Daily   Continuous Infusions:   LOS: 22 days   Leona Rake, MD [facility] P4/26/2025, 10:36 AM

## 2024-03-27 ENCOUNTER — Other Ambulatory Visit: Payer: Self-pay

## 2024-03-27 DIAGNOSIS — E1151 Type 2 diabetes mellitus with diabetic peripheral angiopathy without gangrene: Secondary | ICD-10-CM

## 2024-03-27 DIAGNOSIS — I639 Cerebral infarction, unspecified: Secondary | ICD-10-CM | POA: Diagnosis not present

## 2024-03-27 DIAGNOSIS — Z86718 Personal history of other venous thrombosis and embolism: Secondary | ICD-10-CM

## 2024-03-27 DIAGNOSIS — Z89611 Acquired absence of right leg above knee: Secondary | ICD-10-CM

## 2024-03-27 DIAGNOSIS — I513 Intracardiac thrombosis, not elsewhere classified: Secondary | ICD-10-CM | POA: Diagnosis not present

## 2024-03-27 DIAGNOSIS — I10 Essential (primary) hypertension: Secondary | ICD-10-CM | POA: Diagnosis not present

## 2024-03-27 NOTE — Progress Notes (Signed)
 PROGRESS NOTE    [name]  NWG:[id number] DOB: 09-12-1966 DOA: 03/04/2024 PCP: Abraham Abo, MD   Chief Complaint  Patient presents with   Code Stroke    Brief Narrative:  Patient is a 57-year male with history of cardiomyopathy, current cocaine abuse who presented with complaint of slurred speech, confusion, right-sided weakness.  Code stroke was called.  CT head showed left MCA infarct, CTA head and neck showed M2 occlusion on the left.  Patient was admitted for stroke workup.  Neurology consulted.  Echo showed large mobile left ventricular thrombus, left atrial thrombus, right ventricular apical thrombus with EF of 20%.  Cardiology involved.  Left ventricular thrombus was more than 3 cm in size.  Patient transferred out of ICU on 03/07/2024 to [facility] service.  Stroke workup completed.  PT/OT, SNF on discharge but he wants to go home.He has poor support, his bilateral BKA and also  lacks capacity, psychiatry consulted. He cannot be discharged to home. TOC closely assisting.  Might end up with prolonged hospitalization    Assessment & Plan:   Principal Problem:   Acute CVA (cerebrovascular accident) (HCC) Active Problems:   LV (left ventricular) mural thrombus   Chronic systolic CHF (congestive heart failure) (HCC)   Prolonged QT interval   Elevated liver enzymes   COPD (chronic obstructive pulmonary disease) (HCC)   Cocaine abuse (HCC)   Schizoaffective disorder, bipolar type (HCC)   Benign essential HTN   Acid reflux   History of DVT (deep vein thrombosis)   Type 2 diabetes mellitus (HCC)   Status post bilateral BKA.   Dilated cardiomyopathy (HCC)  #1 acute left MCA stroke -Patient presented with slurred speech, confusion, right-sided weakness. - CT head done with left MCA territory infarct affecting the insula, caudate nucleus, lentiform nucleus, internal capsule.  Asymmetric hyperdensity of the M1/proximal M2 left MCA suspicious for presence of thrombus. - CT angiogram  head and neck done with common carotid and internal carotid arteries patent within the neck.  Atherosclerotic plaque bilaterally.  Severe > 90% stenosis at the right ICA origin which is similar to prior CTA of 02/03/2022.  Vertebral arteries patent in the neck.  Progressive severe stenosis of left vertebral artery proximal V1 segment.  Aortic atherosclerosis and emphysema. - CT angio head also with acute occlusion of proximal M2 left MCA vessel.Intracranial atherosclerotic disease with multifocal stenosis most notably as follows.  Severe near occlusive stenosis of the intracranial left vertebral artery new from prior CTA 02/03/2022.  Severe stenosis of right posterior cerebral artery branch at the P3/P4 junction, also new from prior exam. - CT perfusion identified regions of hypoperfusion totaling 117 mL.  Foci predominantly located within the left MCA vascular territory although some foci are present within the right cerebral watershed territory.  Perfusion software identifies no core infarct.  However acute infarct demonstrated on noncontrast head CT performed.  Perfusion software reports a mismatch volume of 117 mL.  Foci of hypoperfusion detected within the right cerebral watershed territory likely related to high-grade stenosis of the proximal cervical right ICA. - MRI brain with acute infarct of the left basal ganglia and insula with punctuate foci that extend to the subcortical white matter of the left frontal and parietal lobes.  No hemorrhage or mass effect.  Multiple old cerebellar infarcts and findings of chronic small vessel ischemia. - 2D echo noted a large mobile clot in the LV appears to be attached to the intraventricular septum just under the mitral valve.  LVEF <20%.  Left ventricle with severely decreased function with global hypokinesis.  Small RV apical clot.  Right ventricular systolic function severely reduced.  Large immobile clot in the LA.  Right and left atrium mildly dilated. - Fasting  lipid panel with LDL of 71. - Hemoglobin A1c 5.8. - Continue Eliquis  as patient has large mobile intracardiac thrombi. - PT/OT.  2.  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  3.  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 - 2D echo noted a large mobile clot in the LV appears to be attached to the intraventricular septum just under the mitral valve.  LVEF <20%.  Left ventricle with severely decreased function with global hypokinesis.  Small RV apical clot.  Right ventricular systolic function severely reduced.  Large immobile clot in the LA.  Right and left atrium mildly dilated. - Patient initially admitted to the ICU started on anticoagulation. - Hypercoagulable workup noted low Antithrombin activity, low protein C activity. - Continue Eliquis . - Was being followed by cardiology early on in the hospitalization who have signed off.  4.  Acute on chronic hypoxic respiratory failure -Noted to chronically be on 2 L O2. - Stable.  5.  Agitation/lack of capacity -Patient was being followed by psychiatry during the hospitalization. - It is felt patient meets criteria for IVC based on his lack of insight, aggressive behavior. - Patient currently not on IVC however if patient wants to leave will need to be placed on IVC. - Patient noted to intermittently refuse care. - Patient started on Latuda  and gabapentin  which we will continue. - May need guardianship.  6.  HFrEF/CAD - 2D echo with a EF < 20%. - Currently euvolemic. - Continue Lasix , losartan , Coreg , spironolactone , Crestor . - Cardiology was following earlier on during the hospitalization. -It is felt that patient is not a candidate for invasive workup due to extensive substance abuse and active use of  cocaine. - Will need outpatient follow-up with cardiology.  7.  Hypertension -Continue Lasix , losartan , Coreg , spironolactone   8.  Hyperlipidemia -Continue Crestor .  9.  Well-controlled diabetes mellitus type 2 -Hemoglobin A1c 5.8 (01/02/2024) - CBG 112 this morning. - Outpatient follow-up.  10.  Polysubstance abuse -UDS noted to be positive for cocaine, benzos. - Polysubstance cessation.  11.  Debility/deconditioning -Patient seen by PT/OT recommending SNF placement on discharge. - Patient status post right AKA, left BKA and noted to be declining SNF. - Difficult disposition due to lack of capacity. - TOC following. - TOC noted to be contacting APS to discuss guardianship.    DVT prophylaxis: Eliquis  Code Status: Full Family Communication: No family at bedside. Disposition: SNF versus home health  Status is: Inpatient Remains inpatient appropriate because: Unsafe disposition   Consultants:  Neurology: Dr.Dessie 03/04/2024 Cardiology: Dr. [name] Gottron 03/04/2024 PCCM: Dr. Villa Greaser 03/04/2024 Palliative care: Dr. Azalea Lento 03/12/2024 Psychiatry: Dr.Goli 03/15/2024  Procedures:  MRI brain 03/04/2024 2D echo 03/04/2024 Lower extremity Dopplers 03/08/2024 CT head 03/04/2024 CT angiogram head and neck 03/04/2024   Antimicrobials:  Anti-infectives (From admission, onward)    None         Subjective: Patient lying in bed easily arousable.  Denies any chest pain or shortness of breath.  No abdominal pain.  Does not want to be assessed on the physical exam today.  Objective: Vitals:   03/27/24 1502 03/27/24  1655 03/27/24 1957 03/27/24 2135  BP:  (!) 149/97  (!) 136/109  Pulse:  64  65  Resp:    20  Temp:    97.7 F (36.5 C)  TempSrc:    Axillary  SpO2: 100%  98% 96%  Weight:      Height:        Intake/Output Summary (Last 24 hours) at 03/27/2024 2201 Last data filed at 03/27/2024 2015 Gross per 24 hour  Intake --  Output 1200 ml  Net -1200 ml   Filed Weights   03/23/24 0425  03/24/24 0500 03/26/24 0431  Weight: 42 kg 42 kg 42.7 kg    Examination: Refused physical exam General exam: NAD Respiratory system: Refused.  Cardiovascular system: Refused. Gastrointestinal system: Refused. Central nervous system: Alert and oriented.  Refused exam. Extremities: Right AKA, left BKA.   Skin: No rashes, lesions or ulcers Psychiatry: Judgement and insight poor. Mood & affect appropriate.     Data Reviewed: I have personally reviewed following labs and imaging studies  CBC: Recent Labs  Lab 03/23/24 0612  WBC 5.0  HGB 15.3  HCT 46.9  MCV 92.7  PLT 284    Basic Metabolic Panel: Recent Labs  Lab 03/23/24 0612  NA 138  K 4.7  CL 104  CO2 25  GLUCOSE 101*  BUN 20  CREATININE 0.99  CALCIUM  9.2    GFR: Estimated Creatinine Clearance: 50.9 mL/min (by C-G formula based on SCr of 0.99 mg/dL).  Liver Function Tests: No results for input(s): "AST", "ALT", "ALKPHOS", "BILITOT", "PROT", "ALBUMIN" in the last 168 hours.  CBG: No results for input(s): "GLUCAP" in the last 168 hours.   No results found for this or any previous visit (from the past 240 hours).       Radiology Studies: No results found.      Scheduled Meds:  apixaban   5 mg Oral BID   arformoterol   15 mcg Nebulization BID   budesonide  (PULMICORT ) nebulizer solution  0.5 mg Nebulization BID   carvedilol   6.25 mg Oral BID WC   feeding supplement  237 mL Oral BID BM   furosemide   40 mg Oral Daily   gabapentin   200 mg Oral BID   haloperidol  lactate  5 mg Intramuscular Once   losartan   50 mg Oral Daily   lurasidone   40 mg Oral Q breakfast   nicotine   14 mg Transdermal Daily   pantoprazole   40 mg Oral Daily   revefenacin   175 mcg Nebulization Daily   rosuvastatin   5 mg Oral QHS   spironolactone   12.5 mg Oral Daily   Continuous Infusions:   LOS: 23 days    Time spent: 35 minutes    Hilda Lovings, MD [facility]   To contact the attending provider between  7A-7P or the covering provider during after hours 7P-7A, please log into the web site [email or web address] and access using universal [place] password for that web site. If you do not have the password, please call the hospital operator.  03/27/2024, 10:01 PM

## 2024-03-28 DIAGNOSIS — E1151 Type 2 diabetes mellitus with diabetic peripheral angiopathy without gangrene: Secondary | ICD-10-CM | POA: Diagnosis not present

## 2024-03-28 DIAGNOSIS — I639 Cerebral infarction, unspecified: Secondary | ICD-10-CM | POA: Diagnosis not present

## 2024-03-28 DIAGNOSIS — I10 Essential (primary) hypertension: Secondary | ICD-10-CM | POA: Diagnosis not present

## 2024-03-28 DIAGNOSIS — I513 Intracardiac thrombosis, not elsewhere classified: Secondary | ICD-10-CM | POA: Diagnosis not present

## 2024-03-28 NOTE — Progress Notes (Signed)
 PROGRESS NOTE    [name]  WUJ:[id number] DOB: 1966/09/02 DOA: 03/04/2024 PCP: Abraham Abo, MD   Chief Complaint  Patient presents with   Code Stroke    Brief Narrative:  Patient is a 57-year male with history of cardiomyopathy, current cocaine abuse who presented with complaint of slurred speech, confusion, right-sided weakness.  Code stroke was called.  CT head showed left MCA infarct, CTA head and neck showed M2 occlusion on the left.  Patient was admitted for stroke workup.  Neurology consulted.  Echo showed large mobile left ventricular thrombus, left atrial thrombus, right ventricular apical thrombus with EF of 20%.  Cardiology involved.  Left ventricular thrombus was more than 3 cm in size.  Patient transferred out of ICU on 03/07/2024 to [facility] service.  Stroke workup completed.  PT/OT, SNF on discharge but he wants to go home.He has poor support, his bilateral BKA and also  lacks capacity, psychiatry consulted. He cannot be discharged to home. TOC closely assisting.  Might end up with prolonged hospitalization    Assessment & Plan:   Principal Problem:   Acute CVA (cerebrovascular accident) (HCC) Active Problems:   LV (left ventricular) mural thrombus   Chronic systolic CHF (congestive heart failure) (HCC)   Prolonged QT interval   Elevated liver enzymes   COPD (chronic obstructive pulmonary disease) (HCC)   Cocaine abuse (HCC)   Schizoaffective disorder, bipolar type (HCC)   Benign essential HTN   Acid reflux   History of DVT (deep vein thrombosis)   Type 2 diabetes mellitus (HCC)   Status post bilateral BKA.   Dilated cardiomyopathy (HCC)  #1 acute left MCA stroke -Patient presented with slurred speech, confusion, right-sided weakness. - CT head done with left MCA territory infarct affecting the insula, caudate nucleus, lentiform nucleus, internal capsule.  Asymmetric hyperdensity of the M1/proximal M2 left MCA suspicious for presence of thrombus. - CT angiogram  head and neck done with common carotid and internal carotid arteries patent within the neck.  Atherosclerotic plaque bilaterally.  Severe > 90% stenosis at the right ICA origin which is similar to prior CTA of 02/03/2022.  Vertebral arteries patent in the neck.  Progressive severe stenosis of left vertebral artery proximal V1 segment.  Aortic atherosclerosis and emphysema. - CT angio head also with acute occlusion of proximal M2 left MCA vessel.Intracranial atherosclerotic disease with multifocal stenosis most notably as follows.  Severe near occlusive stenosis of the intracranial left vertebral artery new from prior CTA 02/03/2022.  Severe stenosis of right posterior cerebral artery branch at the P3/P4 junction, also new from prior exam. - CT perfusion identified regions of hypoperfusion totaling 117 mL.  Foci predominantly located within the left MCA vascular territory although some foci are present within the right cerebral watershed territory.  Perfusion software identifies no core infarct.  However acute infarct demonstrated on noncontrast head CT performed.  Perfusion software reports a mismatch volume of 117 mL.  Foci of hypoperfusion detected within the right cerebral watershed territory likely related to high-grade stenosis of the proximal cervical right ICA. - MRI brain with acute infarct of the left basal ganglia and insula with punctuate foci that extend to the subcortical white matter of the left frontal and parietal lobes.  No hemorrhage or mass effect.  Multiple old cerebellar infarcts and findings of chronic small vessel ischemia. - 2D echo noted a large mobile clot in the LV appears to be attached to the intraventricular septum just under the mitral valve.  LVEF <20%.  Left ventricle with severely decreased function with global hypokinesis.  Small RV apical clot.  Right ventricular systolic function severely reduced.  Large immobile clot in the LA.  Right and left atrium mildly dilated. - Fasting  lipid panel with LDL of 71. - Hemoglobin A1c 5.8. - Continue Eliquis  as patient has large mobile intracardiac thrombi. - PT/OT.  2.  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  3.  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 - 2D echo noted a large mobile clot in the LV appears to be attached to the intraventricular septum just under the mitral valve.  LVEF <20%.  Left ventricle with severely decreased function with global hypokinesis.  Small RV apical clot.  Right ventricular systolic function severely reduced.  Large immobile clot in the LA.  Right and left atrium mildly dilated. - Patient initially admitted to the ICU started on anticoagulation. - Hypercoagulable workup noted low Antithrombin activity, low protein C activity. - Continue Eliquis .   - Was being followed by cardiology early on in the hospitalization who have signed off. - Outpatient follow-up with cardiology.  4.  Acute on chronic hypoxic respiratory failure -Noted to chronically be on 2 L O2. - Stable.  5.  Agitation/lack of capacity -Patient was being followed by psychiatry during the hospitalization. - It is felt patient meets criteria for IVC based on his lack of insight, aggressive behavior. - Patient currently not on IVC however if patient wants to leave will need to be placed on IVC. - Patient noted to intermittently refuse care. - Patient started on Latuda  and gabapentin  which we will continue. - May need guardianship.  6.  HFrEF/CAD - 2D echo with a EF < 20%. - Currently euvolemic. - Continue Lasix , losartan , Coreg , spironolactone , Crestor . - Cardiology was following earlier on during the hospitalization. -It is felt that patient is not a candidate for invasive workup due to  extensive substance abuse and active use of cocaine. - Will need outpatient follow-up with cardiology.  7.  Hypertension - Continue losartan , Lasix , Coreg , spironolactone .   8.  Hyperlipidemia - Crestor .    9.  Well-controlled diabetes mellitus type 2 -Hemoglobin A1c 5.8 (01/02/2024) - Blood glucose level of 101 on labs this morning.  - Outpatient follow-up.  10.  Polysubstance abuse -UDS noted to be positive for cocaine, benzos. - Polysubstance cessation.  11.  Debility/deconditioning -Patient seen by PT/OT recommending SNF placement on discharge. - Patient status post right AKA, left BKA and noted to be declining SNF. - Difficult disposition due to lack of capacity. - TOC following. - TOC noted to be contacting APS to discuss guardianship.    DVT prophylaxis: Eliquis  Code Status: Full Family Communication: No family at bedside. Disposition: SNF pending guardianship.   Status is: Inpatient Remains inpatient appropriate because: Unsafe disposition   Consultants:  Neurology: Dr.Dessie 03/04/2024 Cardiology: Dr. [name] Gottron 03/04/2024 PCCM: Dr. Villa Greaser 03/04/2024 Palliative care: Dr. Azalea Lento 03/12/2024 Psychiatry: Dr.Goli 03/15/2024  Procedures:  MRI brain 03/04/2024 2D echo 03/04/2024 Lower extremity Dopplers 03/08/2024 CT head 03/04/2024 CT angiogram head and neck 03/04/2024   Antimicrobials:  Anti-infectives (From admission, onward)    None         Subjective: Patient sitting up in bed eating crackers and peanut butter.  Patient with dysarthric speech.  Patient denies any chest pain or  shortness of breath.  Denies any abdominal pain.  Denies any bleeding.  Patient getting easily agitated and states he was kidnapped and brought to the hospital.   Objective: Vitals:   03/28/24 0056 03/28/24 0456 03/28/24 0822 03/28/24 1158  BP:   124/78 133/85  Pulse:    69  Resp:   20 18  Temp:   97.6 F (36.4 C) (!) 97.3 F (36.3 C)  TempSrc:   Oral Oral  SpO2: 100%  100% 98%  Weight:   47.5 kg    Height:        Intake/Output Summary (Last 24 hours) at 03/28/2024 1611 Last data filed at 03/28/2024 0730 Gross per 24 hour  Intake 353 ml  Output 800 ml  Net -447 ml   Filed Weights   03/24/24 0500 03/26/24 0431 03/28/24 0456  Weight: 42 kg 42.7 kg 47.5 kg    Examination: Refused physical exam General exam: NAD Respiratory system: Refused.  Cardiovascular system: Refused. Gastrointestinal system: Refused. Central nervous system: Alert and oriented.  Some dysarthric speech.  Refused exam. Extremities: Right AKA, left BKA.   Skin: No rashes, lesions or ulcers Psychiatry: Judgement and insight poor. Mood & affect appropriate.     Data Reviewed: I have personally reviewed following labs and imaging studies  CBC: Recent Labs  Lab 03/23/24 0612  WBC 5.0  HGB 15.3  HCT 46.9  MCV 92.7  PLT 284    Basic Metabolic Panel: Recent Labs  Lab 03/23/24 0612  NA 138  K 4.7  CL 104  CO2 25  GLUCOSE 101*  BUN 20  CREATININE 0.99  CALCIUM  9.2    GFR: Estimated Creatinine Clearance: 56.6 mL/min (by C-G formula based on SCr of 0.99 mg/dL).  Liver Function Tests: No results for input(s): "AST", "ALT", "ALKPHOS", "BILITOT", "PROT", "ALBUMIN" in the last 168 hours.  CBG: No results for input(s): "GLUCAP" in the last 168 hours.   No results found for this or any previous visit (from the past 240 hours).       Radiology Studies: No results found.      Scheduled Meds:  apixaban   5 mg Oral BID   arformoterol   15 mcg Nebulization BID   budesonide  (PULMICORT ) nebulizer solution  0.5 mg Nebulization BID   carvedilol   6.25 mg Oral BID WC   feeding supplement  237 mL Oral BID BM   furosemide   40 mg Oral Daily   gabapentin   200 mg Oral BID   haloperidol  lactate  5 mg Intramuscular Once   losartan   50 mg Oral Daily   lurasidone   40 mg Oral Q breakfast   nicotine   14 mg Transdermal Daily   pantoprazole   40 mg Oral Daily   revefenacin   175 mcg  Nebulization Daily   rosuvastatin   5 mg Oral QHS   spironolactone   12.5 mg Oral Daily   Continuous Infusions:   LOS: 24 days    Time spent: 35 minutes    Hilda Lovings, MD [facility]   To contact the attending provider between 7A-7P or the covering provider during after hours 7P-7A, please log into the web site [email or web address] and access using universal [place] password for that web site. If you do not have the password, please call the hospital operator.  03/28/2024, 4:11 PM

## 2024-03-28 NOTE — Plan of Care (Signed)
   Problem: Coping: Goal: Will verbalize positive feelings about self Outcome: Progressing

## 2024-03-29 DIAGNOSIS — I513 Intracardiac thrombosis, not elsewhere classified: Secondary | ICD-10-CM | POA: Diagnosis not present

## 2024-03-29 DIAGNOSIS — I639 Cerebral infarction, unspecified: Secondary | ICD-10-CM | POA: Diagnosis not present

## 2024-03-29 DIAGNOSIS — E1151 Type 2 diabetes mellitus with diabetic peripheral angiopathy without gangrene: Secondary | ICD-10-CM | POA: Diagnosis not present

## 2024-03-29 DIAGNOSIS — I10 Essential (primary) hypertension: Secondary | ICD-10-CM | POA: Diagnosis not present

## 2024-03-29 NOTE — Progress Notes (Signed)
 Physical Therapy Treatment Patient Details Name: [name] MRN: [id number] DOB: Sep 10, 1966 Today's Date: 03/29/2024   History of Present Illness Patient is 58 y.o. male presented to ED on 03/04/24 with stroke like symptoms when caregiver found him with slurred speech and right sided weakness. Head CT reveals Acute left MCA territory infarct affecting the insula, caudate nucleus, lentiform nucleus and internal capsule. PMH significant of  Schizoaffective disorder, heart failure with reduced ejection fraction, pretension, DMII, COPD, s/p bilateral amputations, chronic pain, and polysubstance abuse.    PT Comments  Pt tolerated treatment well today. Pt with similar presentation to previous sessions. Pt with continued poor safety awareness requiring cues for locking brakes on WC and taking armrest off. Able to propel about 141ft today however requested to be pushed back on final 90ft due to fatigue. Increased difficulty propelling WC with RUE noted. Pt also noticed with poor problem solving today requiring Min A to transfer back to bed from [facility]. No change in DC/DME recs at this time. PT will continue to follow.    If plan is discharge home, recommend the following: Two people to help with walking and/or transfers;Two people to help with bathing/dressing/bathroom;Assistance with cooking/housework;Assistance with feeding;Direct supervision/assist for medications management;Assist for transportation;Help with stairs or ramp for entrance;Supervision due to cognitive status   Can travel by private vehicle     No  Equipment Recommendations  None recommended by PT    Recommendations for Other Services       Precautions / Restrictions Precautions Precautions: Fall Recall of Precautions/Restrictions: Impaired Precaution/Restrictions Comments: h/o BLE amputations Restrictions Weight Bearing Restrictions Per Provider Order: No     Mobility  Bed Mobility Overal bed mobility: Modified  Independent Bed Mobility: Supine to Sit, Sit to Supine     Supine to sit: Modified independent (Device/Increase time) Sit to supine: Modified independent (Device/Increase time)        Transfers Overall transfer level: Needs assistance Equipment used: None Transfers: Bed to chair/wheelchair/BSC            Lateral/Scoot Transfers: Contact guard assist, Min assist General transfer comment: CGA and verbal cues for safety with locking brakes and setup of w/c. Min A to return to bed due to fatigue and poor problem solving.    Ambulation/Gait               General Gait Details: Pt unable   Psychologist, counselling mobility: Yes Wheelchair propulsion: Both upper extremities Wheelchair parts: Needs assistance Distance: 100 Wheelchair Assistance Details (indicate cue type and reason): Cues for safety and locking brakes. Able to propel about 131ft today however requested to be pushed back on final 8ft due to fatigue. Increased difficulty propelling WC with RUE noted.   Tilt Bed    Modified Rankin (Stroke Patients Only) Modified Rankin (Stroke Patients Only) Pre-Morbid Rankin Score: Slight disability Modified Rankin: Severe disability     Balance Overall balance assessment: Needs assistance Sitting-balance support: Single extremity supported, Feet unsupported Sitting balance-Leahy Scale: Good Sitting balance - Comments: sitting EOB                                    Communication Communication Communication: Impaired Factors Affecting Communication: Difficulty expressing self;Reduced clarity of speech  Cognition Arousal: Alert Behavior During Therapy: Impulsive   PT - Cognitive impairments: Difficult to assess Difficult to  assess due to: Impaired communication                     PT - Cognition Comments: 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 Following commands: Impaired Following commands impaired:  Follows one step commands inconsistently, Follows one step commands with increased time    Cueing Cueing Techniques: Verbal cues, Tactile cues, Gestural cues  Exercises      General Comments General comments (skin integrity, edema, etc.): VSS      Pertinent Vitals/Pain Pain Assessment Pain Assessment: No/denies pain    Home Living                          Prior Function            PT Goals (current goals can now be found in the care plan section) Progress towards PT goals: Progressing toward goals    Frequency    Min 2X/week      PT Plan      Co-evaluation PT/OT/SLP Co-Evaluation/Treatment: Yes Reason for Co-Treatment: Necessary to address cognition/behavior during functional activity;Complexity of the patient's impairments (multi-system involvement) PT goals addressed during session: Mobility/safety with mobility OT goals addressed during session: ADL's and self-care;Other (comment) (Safety)      AM-PAC PT "6 Clicks" Mobility   Outcome Measure  Help needed turning from your back to your side while in a flat bed without using bedrails?: None Help needed moving from lying on your back to sitting on the side of a flat bed without using bedrails?: None Help needed moving to and from a bed to a chair (including a wheelchair)?: A Little Help needed standing up from a chair using your arms (e.g., wheelchair or bedside chair)?: Total Help needed to walk in hospital room?: Total Help needed climbing 3-5 steps with a railing? : Total 6 Click Score: 14    End of Session   Activity Tolerance: Patient tolerated treatment well Patient left: in bed;with call bell/phone within reach;with bed alarm set Nurse Communication: Mobility status PT Visit Diagnosis: Other abnormalities of gait and mobility (R26.89);Muscle weakness (generalized) (M62.81);Other symptoms and signs involving the nervous system (R29.898);Hemiplegia and hemiparesis Hemiplegia - Right/Left:  Right Hemiplegia - dominant/non-dominant: Dominant Hemiplegia - caused by: Cerebral infarction     Time: 0981-1914 PT Time Calculation (min) (ACUTE ONLY): 27 min  Charges:    $Therapeutic Activity: 8-22 mins PT General Charges $$ ACUTE PT VISIT: 1 Visit                     Rodgers Clack, PT, DPT Acute Rehab Services [phone]    Karter Hellmer 03/29/2024, 4:25 PM

## 2024-03-29 NOTE — Progress Notes (Signed)
 Occupational Therapy Treatment Patient Details Name: [name] MRN: [id number] DOB: 03-Nov-1966 Today's Date: 03/29/2024   History of present illness Patient is 58 y.o. male presented to ED on 03/04/24 with stroke like symptoms when caregiver found him with slurred speech and right sided weakness. Head CT reveals Acute left MCA territory infarct affecting the insula, caudate nucleus, lentiform nucleus and internal capsule. PMH significant of  Schizoaffective disorder, heart failure with reduced ejection fraction, pretension, DMII, COPD, s/p bilateral amputations, chronic pain, and polysubstance abuse.   OT comments  Pt with slow, steady progression toward established OT goals. Pt benefiting and responding well to pt-lead session. Pt attempting to order food from cafeteria with NT on arrival. OT facilitating conversation with food services staff with mod cues for pt to assist with recall of what had already been ordered and organization of order. Pt scanning menu L to R. Pt needing increased time for word finding throughout. Facilitated strength, balance, and endurance with OOB mobility to wheelchair and self-propulsion. Pt with decreased proprioception, strength and coordination noted in RUE. Will continue to follow. Recommending inpatient rehab <3 hours; if unable, pt will need maximized [facility] services secondary to need for physical assist, decreased safety, and decreased cognition.       If plan is discharge home, recommend the following:  A little help with walking and/or transfers;A little help with bathing/dressing/bathroom;Supervision due to cognitive status   Equipment Recommendations  None recommended by OT    Recommendations for Other Services      Precautions / Restrictions Precautions Precautions: Fall Recall of Precautions/Restrictions: Impaired Precaution/Restrictions Comments: h/o BLE amputations Restrictions Weight Bearing Restrictions Per Provider Order: No       Mobility  Bed Mobility Overal bed mobility: Modified Independent Bed Mobility: Supine to Sit, Sit to Supine     Supine to sit: Modified independent (Device/Increase time) Sit to supine: Modified independent (Device/Increase time)        Transfers Overall transfer level: Needs assistance Equipment used: None Transfers: Bed to chair/wheelchair/BSC            Lateral/Scoot Transfers: Contact guard assist, Min assist General transfer comment: CGA and verbal cues for safety with locking brakes and setup of w/c. Min A to return to bed due to fatigue and poor problem solving. Pt did have insight to request assist however.     Balance Overall balance assessment: Needs assistance Sitting-balance support: Single extremity supported, Feet unsupported Sitting balance-Leahy Scale: Good Sitting balance - Comments: sitting EOB                                   ADL either performed or assessed with clinical judgement   ADL Overall ADL's : Needs assistance/impaired                         Toilet Transfer: Contact guard assist;Minimal assistance Toilet Transfer Details (indicate cue type and reason): simulated via lateral scoot to drop arm wheelchair                Extremity/Trunk Assessment Upper Extremity Assessment Upper Extremity Assessment: Right hand dominant;RUE deficits/detail RUE Deficits / Details: decreased proprioception, neuromuscular endurance, and strength noted when attempting to prope wheelchair. pt deferred formalized strength testing RUE Coordination: decreased fine motor;decreased gross motor   Lower Extremity Assessment Lower Extremity Assessment: Defer to PT evaluation        Vision  Perception     Praxis     Communication Communication Communication: Impaired Factors Affecting Communication: Difficulty expressing self;Reduced clarity of speech   Cognition Arousal: Alert Behavior During Therapy: Impulsive Cognition:  Cognition impaired Difficult to assess due to: Impaired communication   Awareness: Intellectual awareness impaired, Online awareness impaired Memory impairment (select all impairments): Short-term memory, Working memory Attention impairment (select first level of impairment): Selective attention Executive functioning impairment (select all impairments): Reasoning, Problem solving OT - Cognition Comments: pt with NT attempting to order dinner on arrival; OT facilitating conversation with food services. pt needing mod increased time to word find and mod cues to recall what he has already ordered. pt with fair safety awareness during transfers. Forgot to engage R break on way back to bed but did self-initiate request for assistance to lift body onto higher surface of bed.                 Following commands: Impaired Following commands impaired: Follows one step commands inconsistently, Follows one step commands with increased time      Cueing   Cueing Techniques: Verbal cues, Tactile cues, Gestural cues  Exercises      Shoulder Instructions       General Comments VSS    Pertinent Vitals/ Pain       Pain Assessment Pain Assessment: No/denies pain  Home Living                                          Prior Functioning/Environment              Frequency  Min 1X/week        Progress Toward Goals  OT Goals(current goals can now be found in the care plan section)  Progress towards OT goals: Progressing toward goals  Acute Rehab OT Goals Patient Stated Goal: get out of here OT Goal Formulation: With patient Time For Goal Achievement: 04/07/24 Potential to Achieve Goals: Fair ADL Goals Pt Will Perform Lower Body Bathing: with mod assist;sitting/lateral leans Pt Will Perform Lower Body Dressing: with modified independence;bed level Pt Will Transfer to Toilet: with mod assist;with transfer board;bedside commode Pt Will Perform Toileting - Clothing  Manipulation and hygiene: with mod assist;sitting/lateral leans Additional ADL Goal #1: Pt will transfer to w/c with Mod I as a precursor for OOB ADLs Additional ADL Goal #2: Patient will be able to sit EOB with mod A for 5 minutes as a precursor to OOB activities. Additional ADL Goal #3: Pt will demonstrate proper safety with transfers/mobility 100% of the time during session.  Plan      Co-evaluation      Reason for Co-Treatment: Necessary to address cognition/behavior during functional activity;Complexity of the patient's impairments (multi-system involvement) PT goals addressed during session: Mobility/safety with mobility OT goals addressed during session: ADL's and self-care;Other (comment) (Safety)      AM-PAC OT "6 Clicks" Daily Activity     Outcome Measure   Help from another person eating meals?: None Help from another person taking care of personal grooming?: A Little Help from another person toileting, which includes using toliet, bedpan, or urinal?: A Lot Help from another person bathing (including washing, rinsing, drying)?: A Little Help from another person to put on and taking off regular upper body clothing?: A Little Help from another person to put on and taking off regular lower body clothing?: A Little  6 Click Score: 18    End of Session Equipment Utilized During Treatment: Other (comment) (wheelchair)  OT Visit Diagnosis: Unsteadiness on feet (R26.81);Other abnormalities of gait and mobility (R26.89);Muscle weakness (generalized) (M62.81);Other symptoms and signs involving the nervous system (R29.898);Other symptoms and signs involving cognitive function;Hemiplegia and hemiparesis Hemiplegia - Right/Left: Right Hemiplegia - dominant/non-dominant: Dominant Hemiplegia - caused by: Cerebral infarction   Activity Tolerance Patient tolerated treatment well   Patient Left in bed;with call bell/phone within reach;with bed alarm set   Nurse Communication Mobility  status        Time: 9562-1308 OT Time Calculation (min): 28 min  Charges: OT General Charges $OT Visit: 1 Visit OT Treatments $Self Care/Home Management : 8-22 mins $Therapeutic Activity: 8-22 mins  [name], OTD, OTR/L [facility] Acute Rehabilitation Office: [phone]   [name] 03/29/2024, 4:45 PM

## 2024-03-29 NOTE — Plan of Care (Signed)
  Problem: Education: Goal: Knowledge of disease or condition will improve Outcome: Progressing Goal: Knowledge of secondary prevention will improve (MUST DOCUMENT ALL) Outcome: Progressing Goal: Knowledge of patient specific risk factors will improve (DELETE if not current risk factor) Outcome: Progressing   Problem: Ischemic Stroke/TIA Tissue Perfusion: Goal: Complications of ischemic stroke/TIA will be minimized Outcome: Progressing   Problem: Coping: Goal: Will verbalize positive feelings about self Outcome: Progressing Goal: Will identify appropriate support needs Outcome: Progressing   Problem: Health Behavior/Discharge Planning: Goal: Ability to manage health-related needs will improve Outcome: Progressing Goal: Goals will be collaboratively established with patient/family Outcome: Progressing   Problem: Self-Care: Goal: Ability to participate in self-care as condition permits will improve Outcome: Progressing Goal: Verbalization of feelings and concerns over difficulty with self-care will improve Outcome: Progressing Goal: Ability to communicate needs accurately will improve Outcome: Progressing   Problem: Nutrition: Goal: Risk of aspiration will decrease Outcome: Progressing Goal: Dietary intake will improve Outcome: Progressing

## 2024-03-29 NOTE — Progress Notes (Signed)
 PROGRESS NOTE    [name]  ZOX:[id number] DOB: 12-11-1965 DOA: 03/04/2024 PCP: Abraham Abo, MD   Chief Complaint  Patient presents with   Code Stroke    Brief Narrative:  Patient is a 57-year male with history of cardiomyopathy, current cocaine abuse who presented with complaint of slurred speech, confusion, right-sided weakness.  Code stroke was called.  CT head showed left MCA infarct, CTA head and neck showed M2 occlusion on the left.  Patient was admitted for stroke workup.  Neurology consulted.  Echo showed large mobile left ventricular thrombus, left atrial thrombus, right ventricular apical thrombus with EF of 20%.  Cardiology involved.  Left ventricular thrombus was more than 3 cm in size.  Patient transferred out of ICU on 03/07/2024 to TR[facility] service.  Stroke workup completed.  PT/OT, SNF on discharge but he wants to go home.He has poor support, his bilateral BKA and also  lacks capacity, psychiatry consulted. He cannot be discharged to home. TOC closely assisting.  Might end up with prolonged hospitalization    Assessment & Plan:   Principal Problem:   Acute CVA (cerebrovascular accident) (HCC) Active Problems:   LV (left ventricular) mural thrombus   Chronic systolic CHF (congestive heart failure) (HCC)   Prolonged QT interval   Elevated liver enzymes   COPD (chronic obstructive pulmonary disease) (HCC)   Cocaine abuse (HCC)   Schizoaffective disorder, bipolar type (HCC)   Benign essential HTN   Acid reflux   History of DVT (deep vein thrombosis)   Type 2 diabetes mellitus (HCC)   Status post bilateral BKA.   Dilated cardiomyopathy (HCC)  #1 acute left MCA stroke -Patient presented with slurred speech, confusion, right-sided weakness. - CT head done with left MCA territory infarct affecting the insula, caudate nucleus, lentiform nucleus, internal capsule.  Asymmetric hyperdensity of the M1/proximal M2 left MCA suspicious for presence of thrombus. - CT angiogram  head and neck done with common carotid and internal carotid arteries patent within the neck.  Atherosclerotic plaque bilaterally.  Severe > 90% stenosis at the right ICA origin which is similar to prior CTA of 02/03/2022.  Vertebral arteries patent in the neck.  Progressive severe stenosis of left vertebral artery proximal V1 segment.  Aortic atherosclerosis and emphysema. - CT angio head also with acute occlusion of proximal M2 left MCA vessel.Intracranial atherosclerotic disease with multifocal stenosis most notably as follows.  Severe near occlusive stenosis of the intracranial left vertebral artery new from prior CTA 02/03/2022.  Severe stenosis of right posterior cerebral artery branch at the P3/P4 junction, also new from prior exam. - CT perfusion identified regions of hypoperfusion totaling 117 mL.  Foci predominantly located within the left MCA vascular territory although some foci are present within the right cerebral watershed territory.  Perfusion software identifies no core infarct.  However acute infarct demonstrated on noncontrast head CT performed.  Perfusion software reports a mismatch volume of 117 mL.  Foci of hypoperfusion detected within the right cerebral watershed territory likely related to high-grade stenosis of the proximal cervical right ICA. - MRI brain with acute infarct of the left basal ganglia and insula with punctuate foci that extend to the subcortical white matter of the left frontal and parietal lobes.  No hemorrhage or mass effect.  Multiple old cerebellar infarcts and findings of chronic small vessel ischemia. - 2D echo noted a large mobile clot in the LV appears to be attached to the intraventricular septum just under the mitral valve.  LVEF <20%.  Left ventricle with severely decreased function with global hypokinesis.  Small RV apical clot.  Right ventricular systolic function severely reduced.  Large immobile clot in the LA.  Right and left atrium mildly dilated. - Fasting  lipid panel with LDL of 71. - Hemoglobin A1c 5.8. - Continue Eliquis  as patient has large mobile intracardiac thrombi. - PT/OT.  2.  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  3.  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 - 2D echo noted a large mobile clot in the LV appears to be attached to the intraventricular septum just under the mitral valve.  LVEF <20%.  Left ventricle with severely decreased function with global hypokinesis.  Small RV apical clot.  Right ventricular systolic function severely reduced.  Large immobile clot in the LA.  Right and left atrium mildly dilated. - Patient initially admitted to the ICU started on anticoagulation. - Hypercoagulable workup noted low Antithrombin activity, low protein C activity. - Continue Eliquis .   - Was being followed by cardiology early on in the hospitalization who have signed off. - Outpatient follow-up with cardiology.  4.  Acute on chronic hypoxic respiratory failure -Noted to chronically be on 2 L O2. -Patient currently at rest with sats of 98% on room air. - Stable.  5.  Agitation/lack of capacity -Patient was being followed by psychiatry during the hospitalization. - It is felt patient meets criteria for IVC based on his lack of insight, aggressive behavior. - Patient currently not on IVC however if patient wants to leave will need to be placed on IVC. - Patient noted to intermittently refuse care. - Patient started on Latuda  and gabapentin  which we will continue. - Will need guardianship.  6.  HFrEF/CAD - 2D echo with a EF < 20%. - Euvolemic. - Continue Lasix , losartan , Coreg , spironolactone , Crestor . - Cardiology was following earlier on during the hospitalization. -It is felt that patient is  not a candidate for invasive workup due to extensive substance abuse and active use of cocaine. - Will need outpatient follow-up with cardiology.  7.  Hypertension - Continue Lasix , losartan , Coreg , spironolactone .  8.  Hyperlipidemia - Continue Crestor .   9.  Well-controlled diabetes mellitus type 2 -Hemoglobin A1c 5.8 (01/02/2024) - Outpatient follow-up.  10.  Polysubstance abuse - UDS positive for cocaine and benzos.   - Polysubstance cessation.    11.  Debility/deconditioning -Patient seen by PT/OT recommending SNF placement on discharge. - Patient status post right AKA, left BKA and noted to be declining SNF. - Difficult disposition due to lack of capacity. - TOC following. - TOC noted to be contacting APS to discuss guardianship.    DVT prophylaxis: Eliquis  Code Status: Full Family Communication: No family at bedside. Disposition: SNF pending guardianship.   Status is: Inpatient Remains inpatient appropriate because: Unsafe disposition   Consultants:  Neurology: Dr.Dessie 03/04/2024 Cardiology: Dr. [name] Gottron 03/04/2024 PCCM: Dr. Villa Greaser 03/04/2024 Palliative care: Dr. Azalea Lento 03/12/2024 Psychiatry: Dr.Goli 03/15/2024  Procedures:  MRI brain 03/04/2024 2D echo 03/04/2024 Lower extremity Dopplers 03/08/2024 CT head 03/04/2024 CT angiogram head and neck 03/04/2024   Antimicrobials:  Anti-infectives (From admission, onward)    None         Subjective: Patient laying in bed watching television.  Denies any chest pain or shortness of breath.  Denies any abdominal pain.  Denies any bleeding.  Refusing physical exam again today.    Objective: Vitals:   03/28/24 1158 03/28/24 1921 03/28/24 2047 03/29/24 0209  BP: 133/85  134/82   Pulse: 69  70   Resp: 18  20   Temp: (!) 97.3 F (36.3 C)  97.9 F (36.6 C)   TempSrc: Oral  Oral   SpO2: 98% 96% 99% 98%  Weight:      Height:        Intake/Output Summary (Last 24 hours) at 03/29/2024 1814 Last data filed at 03/29/2024  1700 Gross per 24 hour  Intake 720 ml  Output 600 ml  Net 120 ml   Filed Weights   03/24/24 0500 03/26/24 0431 03/28/24 0456  Weight: 42 kg 42.7 kg 47.5 kg    Examination: Refused physical exam General exam: NAD. Respiratory system: Refused.  Cardiovascular system: Refused. Gastrointestinal system: Refused. Central nervous system: Alert and oriented.  Some dysarthric speech.  Refused exam. Extremities: Right AKA, left BKA.   Skin: No rashes, lesions or ulcers Psychiatry: Judgement and insight poor. Mood & affect appropriate.     Data Reviewed: I have personally reviewed following labs and imaging studies  CBC: Recent Labs  Lab 03/23/24 0612  WBC 5.0  HGB 15.3  HCT 46.9  MCV 92.7  PLT 284    Basic Metabolic Panel: Recent Labs  Lab 03/23/24 0612  NA 138  K 4.7  CL 104  CO2 25  GLUCOSE 101*  BUN 20  CREATININE 0.99  CALCIUM  9.2    GFR: Estimated Creatinine Clearance: 56.6 mL/min (by C-G formula based on SCr of 0.99 mg/dL).  Liver Function Tests: No results for input(s): "AST", "ALT", "ALKPHOS", "BILITOT", "PROT", "ALBUMIN" in the last 168 hours.  CBG: No results for input(s): "GLUCAP" in the last 168 hours.   No results found for this or any previous visit (from the past 240 hours).       Radiology Studies: No results found.      Scheduled Meds:  apixaban   5 mg Oral BID   arformoterol   15 mcg Nebulization BID   budesonide  (PULMICORT ) nebulizer solution  0.5 mg Nebulization BID   carvedilol   6.25 mg Oral BID WC   feeding supplement  237 mL Oral BID BM   furosemide   40 mg Oral Daily   gabapentin   200 mg Oral BID   haloperidol  lactate  5 mg Intramuscular Once   losartan   50 mg Oral Daily   lurasidone   40 mg Oral Q breakfast   nicotine   14 mg Transdermal Daily   pantoprazole   40 mg Oral Daily   revefenacin   175 mcg Nebulization Daily   rosuvastatin   5 mg Oral QHS   spironolactone   12.5 mg Oral Daily   Continuous Infusions:   LOS:  25 days    Time spent: 35 minutes    Hilda Lovings, MD [facility]   To contact the attending provider between 7A-7P or the covering provider during after hours 7P-7A, please log into the web site [email or web address] and access using universal [place] password for that web site. If you do not have the password, please call the hospital operator.  03/29/2024, 6:14 PM

## 2024-03-30 DIAGNOSIS — R451 Restlessness and agitation: Secondary | ICD-10-CM

## 2024-03-30 DIAGNOSIS — I639 Cerebral infarction, unspecified: Secondary | ICD-10-CM | POA: Diagnosis not present

## 2024-03-30 NOTE — Progress Notes (Signed)
 PROGRESS NOTE   [name]  ZOX:[id number]    DOB: 12-01-1966    DOA: 03/04/2024  PCP: Abraham Abo, MD   I have briefly reviewed patients previous medical records in [facility].   Brief Hospital Course:  57-year male with history of cardiomyopathy, current cocaine abuse who presented with complaint of slurred speech, confusion, right-sided weakness.  Code stroke was called.  CT head showed left MCA infarct, CTA head and neck showed M2 occlusion on the left.  Patient was admitted for stroke workup.  Neurology consulted.  Echo showed large mobile left ventricular thrombus, left atrial thrombus, right ventricular apical thrombus with EF of 20%.  Cardiology involved.  Left ventricular thrombus was more than 3 cm in size.  Patient transferred out of ICU on 03/07/2024 to TRH service.  Stroke workup completed.  PT/OT, SNF on discharge but he wants to go home.He has poor support, his bilateral BKA and also  lacks capacity, psychiatry consulted. He cannot be discharged to home. TOC closely assisting.  Might end up with prolonged hospitalization    Assessment & Plan:   Acute left MCA stroke -Patient presented with slurred speech, confusion, right-sided weakness. - CT head done with left MCA territory infarct affecting the insula, caudate nucleus, lentiform nucleus, internal capsule.  Asymmetric hyperdensity of the M1/proximal M2 left MCA suspicious for presence of thrombus. - CT angiogram head and neck done with common carotid and internal carotid arteries patent within the neck.  Atherosclerotic plaque bilaterally.  Severe > 90% stenosis at the right ICA origin which is similar to prior CTA of 02/03/2022.  Vertebral arteries patent in the neck.  Progressive severe stenosis of left vertebral artery proximal V1 segment.  Aortic atherosclerosis and emphysema. - CT angio head also with acute occlusion of proximal M2 left MCA vessel.Intracranial atherosclerotic disease with multifocal stenosis most  notably as follows.  Severe near occlusive stenosis of the intracranial left vertebral artery new from prior CTA 02/03/2022.  Severe stenosis of right posterior cerebral artery branch at the P3/P4 junction, also new from prior exam. - CT perfusion identified regions of hypoperfusion totaling 117 mL.  Foci predominantly located within the left MCA vascular territory although some foci are present within the right cerebral watershed territory.  Perfusion software identifies no core infarct.  However acute infarct demonstrated on noncontrast head CT performed.  Perfusion software reports a mismatch volume of 117 mL.  Foci of hypoperfusion detected within the right cerebral watershed territory likely related to high-grade stenosis of the proximal cervical right ICA. - MRI brain with acute infarct of the left basal ganglia and insula with punctuate foci that extend to the subcortical white matter of the left frontal and parietal lobes.  No hemorrhage or mass effect.  Multiple old cerebellar infarcts and findings of chronic small vessel ischemia. - 2D echo noted a large mobile clot in the LV appears to be attached to the intraventricular septum just under the mitral valve.  LVEF <20%.  Left ventricle with severely decreased function with global hypokinesis.  Small RV apical clot.  Right ventricular systolic function severely reduced.  Large immobile clot in the LA.  Right and left atrium mildly dilated. - LDL of 71.  Hemoglobin A1c 5.8. - Continue Eliquis  as patient has large mobile intracardiac thrombi. - PT recommends home health whereas OT recommends SNF.  See discussion below regarding disposition.   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   Large ventricular/left atrial/right ventricular thrombus with right lower extremity DVT - Thrombi noted on 2D  echo. - 2D echo noted a large mobile clot in the LV appears to be attached to the intraventricular septum just under the mitral valve.  LVEF <20%.  Left ventricle with severely decreased function with global hypokinesis.  Small RV apical clot.  Right ventricular systolic function severely reduced.  Large immobile clot in the LA.  Right and left atrium mildly dilated. - Patient initially admitted to the ICU started on anticoagulation. - Hypercoagulable workup noted low Antithrombin activity, low protein C activity. - Continue Eliquis .   - Was being followed by cardiology early on in the hospitalization who have signed off. - Outpatient follow-up with cardiology.   Acute on chronic hypoxic respiratory failure Reportedly on chronic oxygen  2 L/min but currently saturating in the mid to high 90s on room air.   Agitation/lack of capacity -Patient was being followed by psychiatry during the hospitalization. - It is felt patient meets criteria for IVC based on his lack of insight, aggressive behavior. - Patient currently not on IVC however if patient wants to leave will need to be placed on IVC. - Patient noted to intermittently refuse care. - Patient started on Latuda  and gabapentin  which we will continue. - Will need guardianship.  Per discussion with TOC, has contacted APS.  Based on current evaluation, unable to determine if patient truly has medical decision-making capacity or not.  Although he is alert and oriented x 2, refused MD to examine him.  When brought up the issue of awaiting discharge to SNF, he seemed to get agitated, refusing SNF stating that he prefers to go home and that he has all the equipment at home.  Many times the speech is difficult to understand, garbled.  I will consult psychiatry again to assist with capacity determination-from their prior notes, this was not clear either.  Also on chart review, it appears that he has refused several of his meds consistently.  However he has  been taking his oxycodone  quite regularly.   HFrEF/CAD - 2D echo with a EF < 20%. - Euvolemic. - Continue Lasix , losartan , Coreg , spironolactone , Crestor . - Cardiology was following earlier on during the hospitalization. -It is felt that patient is not a candidate for invasive workup due to extensive substance abuse and active use of cocaine. - Will need outpatient follow-up with cardiology.   Hypertension - Continue Lasix , losartan , Coreg , spironolactone .  Controlled.   Hyperlipidemia - Continue Crestor .    Well-controlled diabetes mellitus type 2 -Hemoglobin A1c 5.8 (01/02/2024) - Outpatient follow-up.   Polysubstance abuse - UDS positive for cocaine and benzos.   - Polysubstance cessation.     Debility/deconditioning -Patient seen by PT/OT initially recommending SNF placement on discharge.  However on most recent evaluation, PT recommends home health PT and OT recommends SNF. - Patient status post right AKA, left BKA and noted to be declining SNF. - Difficult disposition due to lack of capacity-see discussion above.   Discussed in detail with TOC.  They indicate that patient has a caregiver that used to come home about 2.5 hours/day.  They are planning to evaluate the patient in the hospital on 5/1 to let us  know how close or away is he from his baseline.  TOC has already contacted APS.  Patient continues to refuse SNF.  Difficult SNF placement due to his Medicaid not covering SNF.  As stated  above, will reach out to psychiatry for determining capacity, if he has capacity, caregiver evaluation indicates that he is close to baseline then potentially patient can be discharged back home especially since he is refusing SNF repeatedly.    Body mass index is 15.46 kg/m.   DVT prophylaxis:   Apixaban  anticoagulation   Code Status: Full Code:  Family Communication: None at bedside Disposition:  Status is: Inpatient Remains inpatient appropriate because: Awaiting safe disposition.   Otherwise stable for DC from medical standpoint point.  Please see note above.     Consultants:   Neurology:  Cardiology:  PCCM:  Palliative care:  Psychiatry:  Procedures:     Subjective:  Patient awake, alert and oriented to self and "[facility]".  Denies complaints.  Refused physical examination.  Refusing SNF.  Speech garbled and several times very difficult to understand especially when he got a little agitated when brought up the issue of SNF.  Objective:   Vitals:   03/29/24 2056 03/30/24 0322 03/30/24 0859 03/30/24 1146  BP: (!) 138/93   121/82  Pulse: 69  74 67  Resp:   18   Temp: 98 F (36.7 C)   (!) 97.4 F (36.3 C)  TempSrc: Axillary   Axillary  SpO2: 100% 98% 94% 99%  Weight:      Height:        General exam: Young male, looks older than stated age, sitting up comfortably in bed without distress. Refused rest of physical exam. Noted healed stumps of right AKA and left BKA. CNS: Alert and oriented x 2.  No obvious cranial nerve deficits apart from speech abnormality noted above. Extremities: Seems to be moving all limbs symmetrically Psychiatry: Judgment and insight may be impaired but cannot fully evaluate due to lack of patient cooperation.  Appeared somewhat agitated.    Data Reviewed:   I have personally reviewed following labs and imaging studies   CBC: No results for input(s): "WBC", "NEUTROABS", "HGB", "HCT", "MCV", "PLT" in the last 168 hours.  Basic Metabolic Panel: No results for input(s): "NA", "K", "CL", "CO2", "GLUCOSE", "BUN", "CREATININE", "CALCIUM ", "MG", "PHOS" in the last 168 hours.  Liver Function Tests: No results for input(s): "AST", "ALT", "ALKPHOS", "BILITOT", "PROT", "ALBUMIN" in the last 168 hours.  CBG: No results for input(s): "GLUCAP" in the last 168 hours.  Microbiology Studies:  No results found for this or any previous visit (from the past 240 hours).  Radiology Studies:  No results found.  Scheduled  Meds:    apixaban   5 mg Oral BID   arformoterol   15 mcg Nebulization BID   budesonide  (PULMICORT ) nebulizer solution  0.5 mg Nebulization BID   carvedilol   6.25 mg Oral BID WC   feeding supplement  237 mL Oral BID BM   furosemide   40 mg Oral Daily   gabapentin   200 mg Oral BID   haloperidol  lactate  5 mg Intramuscular Once   losartan   50 mg Oral Daily   lurasidone   40 mg Oral Q breakfast   nicotine   14 mg Transdermal Daily   pantoprazole   40 mg Oral Daily   revefenacin   175 mcg Nebulization Daily   rosuvastatin   5 mg Oral QHS   spironolactone   12.5 mg Oral Daily    Continuous Infusions:     LOS: 26 days     Aubrey Blas, MD,  FACP, [facility], [facility], [facility]   [facility] [place]      To contact  the attending provider between 7A-7P or the covering provider during after hours 7P-7A, please log into the web site [email or web address] and access using universal [place] password for that web site. If you do not have the password, please call the hospital operator.  03/30/2024, 12:48 PM

## 2024-03-30 NOTE — Plan of Care (Signed)
  Problem: Education: Goal: Knowledge of disease or condition will improve Outcome: Progressing Goal: Knowledge of secondary prevention will improve (MUST DOCUMENT ALL) Outcome: Progressing Goal: Knowledge of patient specific risk factors will improve (DELETE if not current risk factor) Outcome: Progressing   Problem: Ischemic Stroke/TIA Tissue Perfusion: Goal: Complications of ischemic stroke/TIA will be minimized Outcome: Progressing   Problem: Coping: Goal: Will verbalize positive feelings about self Outcome: Progressing Goal: Will identify appropriate support needs Outcome: Progressing   Problem: Health Behavior/Discharge Planning: Goal: Ability to manage health-related needs will improve Outcome: Progressing Goal: Goals will be collaboratively established with patient/family Outcome: Progressing   Problem: Self-Care: Goal: Ability to participate in self-care as condition permits will improve Outcome: Progressing Goal: Verbalization of feelings and concerns over difficulty with self-care will improve Outcome: Progressing Goal: Ability to communicate needs accurately will improve Outcome: Progressing   Problem: Nutrition: Goal: Risk of aspiration will decrease Outcome: Progressing Goal: Dietary intake will improve Outcome: Progressing

## 2024-03-31 DIAGNOSIS — F1410 Cocaine abuse, uncomplicated: Secondary | ICD-10-CM | POA: Diagnosis not present

## 2024-03-31 DIAGNOSIS — I639 Cerebral infarction, unspecified: Secondary | ICD-10-CM | POA: Diagnosis not present

## 2024-03-31 DIAGNOSIS — F250 Schizoaffective disorder, bipolar type: Secondary | ICD-10-CM | POA: Diagnosis not present

## 2024-03-31 NOTE — Progress Notes (Signed)
 OT Cancellation Note  Patient Details Name: [name] MRN: [id number] DOB: 08/02/1966   Cancelled Treatment:    Reason Eval/Treat Not Completed: Patient declined, no reason specified (pt refused therapy today, OT will continue efforts of therapy and follow-up with pt as able)  03/31/2024  AB, OTR/L  Acute Rehabilitation Services  Office: [phone]   [name] 03/31/2024, 5:08 PM

## 2024-03-31 NOTE — Consult Note (Signed)
 [facility] Psychiatric Consult   Consult for Capacity Evaluation:  Mr. Clesson Lawver was seen today and evaluated specifically for the question of his ability to participate in discharge planning.  1) Decision being assessed:ability to discharge: In an evaluation of capacity, each of the following criteria must be met based for a patient to have capacity to make the decision in question.   Criterion 1: The patient demonstrates a clear and consistent voluntary choice with regard to treatment options.  Criterion 2: The patient adequately understands the disease they have, the treatment proposed, the risks of treatment, and the risks of other treatment (including no treatment).  Criterion 3: The patient acknowledges that the details of Criterion 2 apply to them specifically and the likely consequences of treatment options proposed.  Criterion 4: The patient demonstrates adequate reasoning/rationality within the context of their decision and can provide justification for their choice.   In this case, the patient Jarquavious Fagans does not meet any of the criteria are described above and does not understand acknowledge or demonstrate adequate reasoning or rationality. It is my opinion that Antoniette Batty DOES NOT have capacity to partcipate in discharge planning   See patient interview below for details. Of note, this capacity evaluation assesses only for the specified decision documented above at the time of the assessment and is not a substitute for determination of the patient's overall competency, which can only be adjudicated.    2) Mental Capacity Assessment: I have evaluated the following areas to assess the Lonza Robes mental capacity regarding medical decision-making ability which pertains to competency to discharge.   The specific treatment or service in question is: History of CVA requesting discharge home vs SNF.  Communication: The patient was  unable to clearly state preferred treatment options for his traumatic brain injury and current recommendations per trauma attending.    Understanding: The patient was unable to recall information, link causal relationships, and process general probabilities regarding life situations and medical treatment scenarios. He is not future oriented and unable to provide any related scenarios regarding understanding of current proposed treatment, consequences related to refusal of treatment, and unsafe disposition.. The patient did present with impairments in memory, attention span, or intelligence.   Appreciation: The patient was unable to identify and describe his various illnesses and treatment options with potential outcomes. The patient did not present with concerns such as denial or delusional thought-process.    Rationalization: The patient was unable to weigh risks and benefits and come to a conclusion congruent with patient's perceived goals. Concerns regarding this category are: depression, acute/chronic encephalopathy, alcohol withdrawal, cognitive delay, and traumatic brain injury,  In conclusion, the patient is-not experiencing an acute medical scenario: ICC/SAH.    Conclusion: At this time, there is sufficient evidence to warrant removal of the patient's rights for medical decision-making as it pertains to discharging home.  He can NOT clearly determine mental capacity for decision-making unsure if this is secondary to TBI and or pre-existing cognitive delay.  At this time, we can determine that the patient does NOT have functional mental capacity for medical decision-making including the right to discharge home with no safe disposition.    Psych consult was placed for mental capacity, as patient insight and judgement continue to fluctuate throughout this hospital admission. In terms of decision making capacity, patient lacks insight, appreciation, understanding, rationalization, communication,  consequences, and is unable to link causal relationships. This is evident by his inability to weigh his current physical limitations, housing limitation  and need for physical therapy services. Therefore he lacks capacity as he is unable to appreciate the effects of treatment and his need for ongoing services.    A substitute decision maker must be sought to authorize medical intervention or to refuse treatment on behalf of the patient; for patients with advance directives, either the treatment choice that the patient made in advance or the choice of a surrogate decision maker may be indicated. In the absence of an advance directive and when time is available, the recommendation is usually to contact family members (the priority order to be approached is the spouse, adult children, parents, siblings, and other relatives).  Surrogate decision making for consent to make medical decisions for patient: The medical team has well-founded concerns regarding  .name capacity to provide consent for treatment.   The medical team, together with the social work team, should by good faith attempt to find and contact any family or friends who may be able to make decisions on this patient's behalf.  While legal guardianship is ideal for the long-term benefit of a patient in this situation, there is no appointed guardian for Dresean Hutton  yet.   Medical Decision-Making Capacity: Patient does not have capacity to sign out against medical advice.  It is important to note that decision-making capacity is assessed for a specific decision at a specific moment in time. The reason for this is different decisions have different risks and benefits. The threshold for an individual to display adequate decision-making capacity to provide consent to or refuse treatment should reflect the risks and benefits of that specific decision. Furthermore, decision-making capacity is time sensitive meaning if an individual does not have the  capacity to make a decision one day, this does not mean that they will not have the capacity to make the same medical decision at a later time.  Rajendra Stancato was seen to assess capacity to participate in dc planning; found to not have capacity.. Notably, ethically supported(although not universally accepted) in psychiatry literature that refusal to participate de facto indicates lack of capacity regardless of reason for refusal. Pt did not engage in discussion of medications today. MOCA 23/30 indicating mild cognitive impairment - certainly does not explain extent of pt's refusal to engage.   Buzz Cass, MD  Psychiatrist.

## 2024-03-31 NOTE — Plan of Care (Signed)
  Problem: Coping: Goal: Will verbalize positive feelings about self Outcome: Progressing Goal: Will identify appropriate support needs Outcome: Progressing   Problem: Nutrition: Goal: Risk of aspiration will decrease Outcome: Progressing Goal: Dietary intake will improve Outcome: Progressing   Problem: Self-Care: Goal: Ability to participate in self-care as condition permits will improve Outcome: Progressing Goal: Verbalization of feelings and concerns over difficulty with self-care will improve Outcome: Progressing Goal: Ability to communicate needs accurately will improve Outcome: Progressing  Safety precautions progressing

## 2024-03-31 NOTE — Progress Notes (Signed)
 PROGRESS NOTE   [name]  ZOX:[id number]    DOB: [birth year]    DOA: 03/04/2024  PCP: Abraham Abo, MD   I have briefly reviewed patients previous medical records in [facility].   Brief Hospital Course:  57-year male with history of cardiomyopathy, current cocaine abuse who presented with complaint of slurred speech, confusion, right-sided weakness.  Code stroke was called.  CT head showed left MCA infarct, CTA head and neck showed M2 occlusion on the left.  Patient was admitted for stroke workup.  Neurology consulted.  Echo showed large mobile left ventricular thrombus, left atrial thrombus, right ventricular apical thrombus with EF of 20%.  Cardiology involved.  Left ventricular thrombus was more than 3 cm in size.  Patient transferred out of ICU on 03/07/2024 to TRH service.  Stroke workup completed.  PT/OT, recommend SNF on discharge but he wants to go home.He has poor support, his bilateral BKA and also lacks capacity (Psychiatry clearly documented this on 03/31/2024). He cannot be discharged to home. TOC closely assisting.  Since patient lacks medical decision-making capacity, he cannot sign out AMA and if he threatens to sign out AMA, he will need to be involuntarily committed.  Given difficult and limited discharge options due to substance abuse, insurance issues, likely will be a prolonged hospital stay.  Assessment & Plan:   Acute left MCA stroke -Patient presented with slurred speech, confusion, right-sided weakness. - CT head done with left MCA territory infarct affecting the insula, caudate nucleus, lentiform nucleus, internal capsule.  Asymmetric hyperdensity of the M1/proximal M2 left MCA suspicious for presence of thrombus. - CT angiogram head and neck done with common carotid and internal carotid arteries patent within the neck.  Atherosclerotic plaque bilaterally.  Severe > 90% stenosis at the right ICA origin which is similar to prior CTA of 02/03/2022.  Vertebral arteries  patent in the neck.  Progressive severe stenosis of left vertebral artery proximal V1 segment.  Aortic atherosclerosis and emphysema. - CT angio head also with acute occlusion of proximal M2 left MCA vessel.Intracranial atherosclerotic disease with multifocal stenosis most notably as follows.  Severe near occlusive stenosis of the intracranial left vertebral artery new from prior CTA 02/03/2022.  Severe stenosis of right posterior cerebral artery branch at the P3/P4 junction, also new from prior exam. - CT perfusion identified regions of hypoperfusion totaling 117 mL.  Foci predominantly located within the left MCA vascular territory although some foci are present within the right cerebral watershed territory.  Perfusion software identifies no core infarct.  However acute infarct demonstrated on noncontrast head CT performed.  Perfusion software reports a mismatch volume of 117 mL.  Foci of hypoperfusion detected within the right cerebral watershed territory likely related to high-grade stenosis of the proximal cervical right ICA. - MRI brain with acute infarct of the left basal ganglia and insula with punctuate foci that extend to the subcortical white matter of the left frontal and parietal lobes.  No hemorrhage or mass effect.  Multiple old cerebellar infarcts and findings of chronic small vessel ischemia. - 2D echo noted a large mobile clot in the LV appears to be attached to the intraventricular septum just under the mitral valve.  LVEF <20%.  Left ventricle with severely decreased function with global hypokinesis.  Small RV apical clot.  Right ventricular systolic function severely reduced.  Large immobile clot in the LA.  Right and left atrium mildly dilated. - LDL of 71.  Hemoglobin A1c 5.8. - Continue Eliquis  as  patient has large mobile intracardiac thrombi. - PT recommends home health whereas OT recommends SNF.  See discussion below regarding disposition.   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 - 2D echo noted a large mobile clot in the LV appears to be attached to the intraventricular septum just under the mitral valve.  LVEF <20%.  Left ventricle with severely decreased function with global hypokinesis.  Small RV apical clot.  Right ventricular systolic function severely reduced.  Large immobile clot in the LA.  Right and left atrium mildly dilated. - Patient initially admitted to the ICU started on anticoagulation. - Hypercoagulable workup noted low Antithrombin activity, low protein C activity. - Continue Eliquis .   - Was being followed by cardiology early on in the hospitalization who have signed off. - Outpatient follow-up with cardiology.   Acute on chronic hypoxic respiratory failure Reportedly on chronic oxygen  2 L/min but currently saturating in the mid to high 90s on room air.   Agitation/lack of capacity -Patient was being followed by psychiatry during the hospitalization. - It is felt patient meets criteria for IVC based on his lack of insight, aggressive behavior. - Patient currently not on IVC however if patient wants to leave will need to be placed on IVC. - Patient noted to intermittently refuse care. - Patient started on Latuda  and gabapentin  which we will continue. - Will need guardianship.  Per discussion with TOC, has contacted [facility]. Psychiatry was consulted.  Their input on 03/31/24 appreciated and they have clearly indicated that patient lacks medical decision making capacity and cannot sign out AMA.   HFrEF/CAD - 2D echo with a EF < 20%. - Euvolemic. - Continue Lasix , losartan , Coreg , spironolactone , Crestor . - Cardiology was following earlier on during the hospitalization. -It is felt  that patient is not a candidate for invasive workup due to extensive substance abuse and active use of cocaine. - Will need outpatient follow-up with cardiology.   Hypertension - Continue Lasix , losartan , Coreg , spironolactone .  Controlled.   Hyperlipidemia - Continue Crestor .    Well-controlled diabetes mellitus type 2 -Hemoglobin A1c 5.8 (01/02/2024) - Outpatient follow-up.   Polysubstance abuse - UDS positive for cocaine and benzos.   - Polysubstance cessation.     Debility/deconditioning -Patient seen by PT/OT initially recommending SNF placement on discharge.  However on most recent evaluation, PT recommends home health PT and OT recommends SNF. - Patient status post right AKA, left BKA and noted to be declining SNF. - Difficult disposition due to lack of capacity-see discussion above.   Discussed in detail with TOC.  They indicate that patient has a caregiver that used to come home about 2.5 hours/day.  They are planning to evaluate the patient in the hospital on 5/1 to let us  know how close or away is he from his baseline.  TOC has already contacted [facility].  Patient continues to refuse SNF.  Difficult SNF placement due to his Medicaid not covering SNF.     Body mass index is 15.46 kg/m.   DVT prophylaxis:   Apixaban  anticoagulation   Code Status: Full Code:  Family Communication: None at bedside Disposition:  Status is: Inpatient Remains inpatient appropriate because: Awaiting safe disposition.  Otherwise stable for DC from medical standpoint point.  Please see note  above.     Consultants:   Neurology:  Cardiology:  PCCM:  Palliative care:  Psychiatry:  Procedures:     Subjective:  Difficult to understand speech.  Garbled speech at times.  Also many inappropriate responses to the questions asked.  Follows simple instructions.  Allowed exam today however.  As per long discussion with Ms. Ahamed Jeffress, city community safety department who knows him from the  outpatient world, she met with patient today, he was noted to have word finding difficulty, expressing difficulty, tangential thought, she feels that he is better than their previous evaluation but not back to his baseline.  Objective:   Vitals:   03/30/24 2008 03/30/24 2203 03/30/24 2300 03/31/24 0830  BP:   (!) 162/97 (!) 113/100  Pulse:  79 70 91  Resp:  18  16  Temp:  98 F (36.7 C) 98 F (36.7 C) (!) 97.5 F (36.4 C)  TempSrc:  Oral Oral Oral  SpO2: 96% 100% 100% 98%  Weight:      Height:        General exam: Young male, looks older than stated age, sitting up comfortably in bed without distress. Respiratory system: Clear to auscultation.  No increased work of breathing. CVS: S1 and S2 heard, RRR.  No JVD, murmurs or pedal edema. Abdomen: Nondistended and soft.  No organomegaly or masses appreciated.  Normal bowel sounds heard. CNS: Alert and oriented x 2.  No obvious cranial nerve deficits apart from speech abnormality noted above. Extremities: Seems to be moving all limbs symmetrically.  Healed stumps of right AKA and left BKA. Psychiatry: Judgment and insight impaired.  This morning patient was calm and cooperative..    Data Reviewed:   I have personally reviewed following labs and imaging studies   CBC: No results for input(s): "WBC", "NEUTROABS", "HGB", "HCT", "MCV", "PLT" in the last 168 hours.  Basic Metabolic Panel: No results for input(s): "NA", "K", "CL", "CO2", "GLUCOSE", "BUN", "CREATININE", "CALCIUM ", "MG", "PHOS" in the last 168 hours.  Liver Function Tests: No results for input(s): "AST", "ALT", "ALKPHOS", "BILITOT", "PROT", "ALBUMIN" in the last 168 hours.  CBG: No results for input(s): "GLUCAP" in the last 168 hours.  Microbiology Studies:  No results found for this or any previous visit (from the past 240 hours).  Radiology Studies:  No results found.  Scheduled Meds:    apixaban   5 mg Oral BID   arformoterol   15 mcg Nebulization BID    budesonide  (PULMICORT ) nebulizer solution  0.5 mg Nebulization BID   carvedilol   6.25 mg Oral BID WC   feeding supplement  237 mL Oral BID BM   furosemide   40 mg Oral Daily   gabapentin   200 mg Oral BID   haloperidol  lactate  5 mg Intramuscular Once   losartan   50 mg Oral Daily   lurasidone   40 mg Oral Q breakfast   nicotine   14 mg Transdermal Daily   pantoprazole   40 mg Oral Daily   revefenacin   175 mcg Nebulization Daily   rosuvastatin   5 mg Oral QHS   spironolactone   12.5 mg Oral Daily    Continuous Infusions:     LOS: 27 days     Aubrey Blas, MD,  FACP, [facility], [facility], [facility]   [facility] [place]      To contact the attending provider between 7A-7P or the covering provider during after hours 7P-7A, please log into the web site [email or web address] and access using universal [place]  password for that web site. If you do not have the password, please call the hospital operator.  03/31/2024, 3:33 PM

## 2024-04-01 DIAGNOSIS — I639 Cerebral infarction, unspecified: Secondary | ICD-10-CM | POA: Diagnosis not present

## 2024-04-01 NOTE — Progress Notes (Signed)
 PROGRESS NOTE   [name]  ZOX:[id number]    DOB: Dec 18, 1965    DOA: 03/04/2024  PCP: Abraham Abo, MD   I have briefly reviewed patients previous medical records in [facility].   Brief Hospital Course:  57-year male with history of cardiomyopathy, current cocaine abuse who presented with complaint of slurred speech, confusion, right-sided weakness.  Code stroke was called.  CT head showed left MCA infarct, CTA head and neck showed M2 occlusion on the left.  Patient was admitted for stroke workup.  Neurology consulted.  Echo showed large mobile left ventricular thrombus, left atrial thrombus, right ventricular apical thrombus with EF of 20%.  Cardiology involved.  Left ventricular thrombus was more than 3 cm in size.  Patient transferred out of ICU on 03/07/2024 to TRH service.  Stroke workup completed.  PT/OT, recommend SNF on discharge but he wants to go home.He has poor support, his bilateral BKA and also lacks capacity (Psychiatry clearly documented this on 03/31/2024). He cannot be discharged to home. TOC closely assisting.  Since patient lacks medical decision-making capacity, he cannot sign out AMA and if he threatens to sign out AMA, he will need to be involuntarily committed.  Given difficult and limited discharge options due to substance abuse, insurance issues, likely will be a prolonged hospital stay.  Assessment & Plan:   Acute left MCA stroke -Patient presented with slurred speech, confusion, right-sided weakness. - CT head done with left MCA territory infarct affecting the insula, caudate nucleus, lentiform nucleus, internal capsule.  Asymmetric hyperdensity of the M1/proximal M2 left MCA suspicious for presence of thrombus. - CT angiogram head and neck done with common carotid and internal carotid arteries patent within the neck.  Atherosclerotic plaque bilaterally.  Severe > 90% stenosis at the right ICA origin which is similar to prior CTA of 02/03/2022.  Vertebral arteries  patent in the neck.  Progressive severe stenosis of left vertebral artery proximal V1 segment.  Aortic atherosclerosis and emphysema. - CT angio head also with acute occlusion of proximal M2 left MCA vessel.Intracranial atherosclerotic disease with multifocal stenosis most notably as follows.  Severe near occlusive stenosis of the intracranial left vertebral artery new from prior CTA 02/03/2022.  Severe stenosis of right posterior cerebral artery branch at the P3/P4 junction, also new from prior exam. - CT perfusion identified regions of hypoperfusion totaling 117 mL.  Foci predominantly located within the left MCA vascular territory although some foci are present within the right cerebral watershed territory.  Perfusion software identifies no core infarct.  However acute infarct demonstrated on noncontrast head CT performed.  Perfusion software reports a mismatch volume of 117 mL.  Foci of hypoperfusion detected within the right cerebral watershed territory likely related to high-grade stenosis of the proximal cervical right ICA. - MRI brain with acute infarct of the left basal ganglia and insula with punctuate foci that extend to the subcortical white matter of the left frontal and parietal lobes.  No hemorrhage or mass effect.  Multiple old cerebellar infarcts and findings of chronic small vessel ischemia. - 2D echo noted a large mobile clot in the LV appears to be attached to the intraventricular septum just under the mitral valve.  LVEF <20%.  Left ventricle with severely decreased function with global hypokinesis.  Small RV apical clot.  Right ventricular systolic function severely reduced.  Large immobile clot in the LA.  Right and left atrium mildly dilated. - LDL of 71.  Hemoglobin A1c 5.8. - Continue Eliquis  as  patient has large mobile intracardiac thrombi. - PT recommends home health whereas OT recommends SNF.  See discussion below regarding disposition.   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 - 2D echo noted a large mobile clot in the LV appears to be attached to the intraventricular septum just under the mitral valve.  LVEF <20%.  Left ventricle with severely decreased function with global hypokinesis.  Small RV apical clot.  Right ventricular systolic function severely reduced.  Large immobile clot in the LA.  Right and left atrium mildly dilated. - Patient initially admitted to the ICU started on anticoagulation. - Hypercoagulable workup noted low Antithrombin activity, low protein C activity. - Continue Eliquis .   - Was being followed by cardiology early on in the hospitalization who have signed off. - Outpatient follow-up with cardiology.   Acute on chronic hypoxic respiratory failure Reportedly on chronic oxygen  2 L/min but currently saturating in the mid to high 90s on room air.   Agitation/lack of capacity -Patient was being followed by psychiatry during the hospitalization. - It is felt patient meets criteria for IVC based on his lack of insight, aggressive behavior. - Patient currently not on IVC however if patient wants to leave will need to be placed on IVC. - Patient noted to intermittently refuse care. - Patient started on Latuda  and gabapentin  which we will continue. - Will need guardianship.  Per discussion with TOC, has contacted [facility]. Psychiatry was consulted.  Their input on 03/31/24 appreciated and they have clearly indicated that patient lacks medical decision making capacity and cannot sign out AMA.   HFrEF/CAD - 2D echo with a EF < 20%. - Euvolemic. - Continue Lasix , losartan , Coreg , spironolactone , Crestor . - Cardiology was following earlier on during the hospitalization. -It is felt  that patient is not a candidate for invasive workup due to extensive substance abuse and active use of cocaine. - Will need outpatient follow-up with cardiology. Since patient is on Lasix  and losartan , no labs since 4/23, will recheck BMP in AM.  Hypertension - Continue Lasix , losartan , Coreg , spironolactone .  Controlled.   Hyperlipidemia - Continue Crestor .    Well-controlled diabetes mellitus type 2 -Hemoglobin A1c 5.8 (01/02/2024) - Outpatient follow-up.   Polysubstance abuse - UDS positive for cocaine and benzos.   - Polysubstance cessation.     Debility/deconditioning -Patient seen by PT/OT initially recommending SNF placement on discharge.  However on most recent evaluation, PT recommends home health PT and OT recommends SNF. - Patient status post right AKA, left BKA and noted to be declining SNF. - Difficult disposition due to lack of capacity-see discussion above.   Discussed in detail with TOC.  They indicate that patient has a caregiver that used to come home about 2.5 hours/day.  They are planning to evaluate the patient in the hospital on 5/1 to let us  know how close or away is he from his baseline.  TOC has already contacted [facility].  Patient continues to refuse SNF.  Difficult SNF placement due to his Medicaid not covering SNF.     Body mass index is 15.46 kg/m.   DVT prophylaxis:   Apixaban  anticoagulation   Code Status: Full Code:  Family Communication: None at bedside Disposition:  Status is: Inpatient Remains inpatient appropriate because: Awaiting  safe disposition.  Otherwise stable for DC from medical standpoint point.  Please see note above.     Consultants:   Neurology:  Cardiology:  PCCM:  Palliative care:  Psychiatry:  Procedures:     Subjective:  Pleasant and cooperative.  Sitting up and eating breakfast.  Denies complaints.  Objective:   Vitals:   03/31/24 1831 03/31/24 1934 04/01/24 0025 04/01/24 0845  BP:  (!) 125/104 114/85   Pulse:    74 75  Resp:   18 18  Temp:  98.4 F (36.9 C) (!) 97.5 F (36.4 C)   TempSrc:  Oral Oral   SpO2: 96% 100% 92% 98%  Weight:      Height:        General exam: Young male, looks older than stated age, sitting up comfortably in bed without distress.  Eating breakfast. Respiratory system: Clear to auscultation.  No increased work of breathing. CVS: S1 and S2 heard, RRR.  No JVD, murmurs or pedal edema. Abdomen: Nondistended and soft.  No organomegaly or masses appreciated.  Normal bowel sounds heard. CNS: Alert and oriented x 2.  No obvious cranial nerve deficits apart from speech abnormality noted above. Extremities: Seems to be moving all limbs symmetrically.  Healed stumps of right AKA and left BKA. Psychiatry: Judgment and insight impaired.  This morning patient was calm and cooperative..    Data Reviewed:   I have personally reviewed following labs and imaging studies   CBC: No results for input(s): "WBC", "NEUTROABS", "HGB", "HCT", "MCV", "PLT" in the last 168 hours.  Basic Metabolic Panel: No results for input(s): "NA", "K", "CL", "CO2", "GLUCOSE", "BUN", "CREATININE", "CALCIUM ", "MG", "PHOS" in the last 168 hours.  Liver Function Tests: No results for input(s): "AST", "ALT", "ALKPHOS", "BILITOT", "PROT", "ALBUMIN" in the last 168 hours.  CBG: No results for input(s): "GLUCAP" in the last 168 hours.  Microbiology Studies:  No results found for this or any previous visit (from the past 240 hours).  Radiology Studies:  No results found.  Scheduled Meds:    apixaban   5 mg Oral BID   arformoterol   15 mcg Nebulization BID   budesonide  (PULMICORT ) nebulizer solution  0.5 mg Nebulization BID   carvedilol   6.25 mg Oral BID WC   feeding supplement  237 mL Oral BID BM   furosemide   40 mg Oral Daily   gabapentin   200 mg Oral BID   haloperidol  lactate  5 mg Intramuscular Once   losartan   50 mg Oral Daily   lurasidone   40 mg Oral Q breakfast   nicotine   14 mg Transdermal  Daily   pantoprazole   40 mg Oral Daily   revefenacin   175 mcg Nebulization Daily   rosuvastatin   5 mg Oral QHS   spironolactone   12.5 mg Oral Daily    Continuous Infusions:     LOS: 28 days     Aubrey Blas, MD,  FACP, [facility], [facility], [facility]   [facility] [place]      To contact the attending provider between 7A-7P or the covering provider during after hours 7P-7A, please log into the web site [email or web address] and access using universal [place] password for that web site. If you do not have the password, please call the hospital operator.  04/01/2024, 10:27 AM

## 2024-04-01 NOTE — Plan of Care (Signed)
  Problem: Education: Goal: Knowledge of disease or condition will improve 04/01/2024 0615 by Francena Infield, RN Outcome: Progressing 04/01/2024 0513 by Francena Infield, RN Outcome: Progressing Goal: Knowledge of secondary prevention will improve (MUST DOCUMENT ALL) 04/01/2024 0615 by Francena Infield, RN Outcome: Progressing 04/01/2024 0513 by Francena Infield, RN Outcome: Progressing Goal: Knowledge of patient specific risk factors will improve (DELETE if not current risk factor) 04/01/2024 0615 by Francena Infield, RN Outcome: Progressing 04/01/2024 0513 by Francena Infield, RN Outcome: Progressing   Problem: Ischemic Stroke/TIA Tissue Perfusion: Goal: Complications of ischemic stroke/TIA will be minimized 04/01/2024 0615 by Francena Infield, RN Outcome: Progressing 04/01/2024 0513 by Francena Infield, RN Outcome: Progressing

## 2024-04-01 NOTE — Progress Notes (Signed)
 PT Cancellation Note  Patient Details Name: [name] MRN: [id number] DOB: 11-Aug-1966   Cancelled Treatment:    Reason Eval/Treat Not Completed: Patient declined, no reason specified (Pt refused stating that he doesnt feel like it today.)   Karsin Pesta 04/01/2024, 1:59 PM

## 2024-04-01 NOTE — TOC Progression Note (Signed)
 Transition of Care [facility]) - Progression Note    Patient Details  Name: [name] MRN: [id number] Date of Birth: 1966-11-07  Transition of Care [facility]) CM/SW Contact  Tandy Fam, [name] Phone Number: 04/01/2024, 10:53 AM  Clinical Narrative:   CSW noting psychiatry evaluation that patient does not have capacity. Patient has been assigned an APS social worker for guardianship, but CSW has not received a call from them yet. CSW contacted APS to discover social worker, patient is being followed by Josefina Nian with APS. CSW contacted Josefina Nian to ask for update, left a voicemail. CSW to follow.    Expected Discharge Plan: Skilled Nursing Facility Barriers to Discharge: Continued Medical Work up, English as a second language teacher, Active Substance Use - Placement, Inadequate or no insurance, SNF Pending payor source - LOG, SNF Pending Medicaid, SNF Pending bed offer  Expected Discharge Plan and Services       Living arrangements for the past 2 months: Apartment Expected Discharge Date: 03/14/24                                     Social Determinants of Health (SDOH) Interventions SDOH Screenings   Food Insecurity: Food Insecurity Present (03/06/2024)  Housing: High Risk (03/06/2024)  Transportation Needs: No Transportation Needs (03/06/2024)  Utilities: Patient Declined (03/06/2024)  Alcohol Screen: Medium Risk (06/24/2019)  Depression (PHQ2-9): Low Risk  (02/18/2024)  Recent Concern: Depression (PHQ2-9) - Medium Risk (01/21/2024)  Financial Resource Strain: High Risk (07/24/2021)   Received from [facility], [facility]  Physical Activity: Inactive (02/18/2024)  Social Connections: Unknown (04/05/2022)   Received from [facility], [facility]  Stress: Stress Concern Present (07/24/2021)   Received from [facility], [facility]  Tobacco Use: High Risk (03/04/2024)  Health Literacy: Adequate Health Literacy (02/18/2024)    Readmission Risk Interventions    12/21/2023    9:25 AM  12/09/2022    9:02 AM 12/01/2022    3:56 PM  Readmission Risk Prevention Plan  Transportation Screening Complete Complete Complete  HRI or Home Care Consult Complete    Social Work Consult for Recovery Care Planning/Counseling Complete    Palliative Care Screening Not Applicable    Medication Review Oceanographer) Complete Complete Complete  PCP or Specialist appointment within 3-5 days of discharge  Complete Complete  HRI or Home Care Consult  Complete Complete  SW Recovery Care/Counseling Consult  Complete Complete  Palliative Care Screening  Not Applicable Not Applicable  Skilled Nursing Facility  Not Applicable Not Applicable  SNF Comments   n/a

## 2024-04-01 NOTE — Plan of Care (Signed)
  Problem: Coping: Goal: Will verbalize positive feelings about self Outcome: Progressing Goal: Will identify appropriate support needs Outcome: Progressing   Problem: Health Behavior/Discharge Planning: Goal: Ability to manage health-related needs will improve Outcome: Progressing Goal: Goals will be collaboratively established with patient/family Outcome: Progressing   Problem: Nutrition: Goal: Risk of aspiration will decrease Outcome: Progressing Goal: Dietary intake will improve Outcome: Progressing   Problem: Education: Goal: Knowledge of disease or condition will improve Outcome: Progressing Goal: Knowledge of secondary prevention will improve (MUST DOCUMENT ALL) Outcome: Progressing Goal: Knowledge of patient specific risk factors will improve (DELETE if not current risk factor) Outcome: Progressing

## 2024-04-02 DIAGNOSIS — I639 Cerebral infarction, unspecified: Secondary | ICD-10-CM | POA: Diagnosis not present

## 2024-04-02 NOTE — Plan of Care (Signed)
  Problem: Health Behavior/Discharge Planning: Goal: Goals will be collaboratively established with patient/family Outcome: Progressing   Problem: Self-Care: Goal: Ability to participate in self-care as condition permits will improve Outcome: Progressing

## 2024-04-02 NOTE — Progress Notes (Signed)
 PROGRESS NOTE   [name]  WUJ:[id number]    DOB: Apr 19, 1966    DOA: 03/04/2024  PCP: Abraham Abo, MD   I have briefly reviewed patients previous medical records in [facility].   Brief Hospital Course:  57-year male with history of cardiomyopathy, current cocaine abuse who presented with complaint of slurred speech, confusion, right-sided weakness.  Code stroke was called.  CT head showed left MCA infarct, CTA head and neck showed M2 occlusion on the left.  Patient was admitted for stroke workup.  Neurology consulted.  Echo showed large mobile left ventricular thrombus, left atrial thrombus, right ventricular apical thrombus with EF of 20%.  Cardiology involved.  Left ventricular thrombus was more than 3 cm in size.  Patient transferred out of ICU on 03/07/2024 to TRH service.  Stroke workup completed.  PT/OT, recommend SNF on discharge but he wants to go home.He has poor support, his bilateral BKA and also lacks capacity (Psychiatry clearly documented this on 03/31/2024). He cannot be discharged to home. TOC closely assisting.  Since patient lacks medical decision-making capacity, he cannot sign out AMA and if he threatens to sign out AMA, he will need to be involuntarily committed.  Given difficult and limited discharge options due to substance abuse, insurance issues, likely will be a prolonged hospital stay.  Assessment & Plan:   Acute left MCA stroke -Patient presented with slurred speech, confusion, right-sided weakness. - CT head done with left MCA territory infarct affecting the insula, caudate nucleus, lentiform nucleus, internal capsule.  Asymmetric hyperdensity of the M1/proximal M2 left MCA suspicious for presence of thrombus. - CT angiogram head and neck done with common carotid and internal carotid arteries patent within the neck.  Atherosclerotic plaque bilaterally.  Severe > 90% stenosis at the right ICA origin which is similar to prior CTA of 02/03/2022.  Vertebral arteries  patent in the neck.  Progressive severe stenosis of left vertebral artery proximal V1 segment.  Aortic atherosclerosis and emphysema. - CT angio head also with acute occlusion of proximal M2 left MCA vessel.Intracranial atherosclerotic disease with multifocal stenosis most notably as follows.  Severe near occlusive stenosis of the intracranial left vertebral artery new from prior CTA 02/03/2022.  Severe stenosis of right posterior cerebral artery branch at the P3/P4 junction, also new from prior exam. - CT perfusion identified regions of hypoperfusion totaling 117 mL.  Foci predominantly located within the left MCA vascular territory although some foci are present within the right cerebral watershed territory.  Perfusion software identifies no core infarct.  However acute infarct demonstrated on noncontrast head CT performed.  Perfusion software reports a mismatch volume of 117 mL.  Foci of hypoperfusion detected within the right cerebral watershed territory likely related to high-grade stenosis of the proximal cervical right ICA. - MRI brain with acute infarct of the left basal ganglia and insula with punctuate foci that extend to the subcortical white matter of the left frontal and parietal lobes.  No hemorrhage or mass effect.  Multiple old cerebellar infarcts and findings of chronic small vessel ischemia. - 2D echo noted a large mobile clot in the LV appears to be attached to the intraventricular septum just under the mitral valve.  LVEF <20%.  Left ventricle with severely decreased function with global hypokinesis.  Small RV apical clot.  Right ventricular systolic function severely reduced.  Large immobile clot in the LA.  Right and left atrium mildly dilated. - LDL of 71.  Hemoglobin A1c 5.8. - Continue Eliquis  as  patient has large mobile intracardiac thrombi. - PT recommends home health whereas OT recommends SNF.  See discussion below regarding disposition.   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 - 2D echo noted a large mobile clot in the LV appears to be attached to the intraventricular septum just under the mitral valve.  LVEF <20%.  Left ventricle with severely decreased function with global hypokinesis.  Small RV apical clot.  Right ventricular systolic function severely reduced.  Large immobile clot in the LA.  Right and left atrium mildly dilated. - Patient initially admitted to the ICU started on anticoagulation. - Hypercoagulable workup noted low Antithrombin activity, low protein C activity. - Continue Eliquis .   - Was being followed by cardiology early on in the hospitalization who have signed off. - Outpatient follow-up with cardiology.   Acute on chronic hypoxic respiratory failure Reportedly on chronic oxygen  2 L/min but currently saturating in the mid to high 90s on room air.   Agitation/lack of capacity -Patient was being followed by psychiatry during the hospitalization. - It is felt patient meets criteria for IVC based on his lack of insight, aggressive behavior. - Patient currently not on IVC however if patient wants to leave will need to be placed on IVC. - Patient noted to intermittently refuse care. - Patient started on Latuda  and gabapentin  which we will continue. - Will need guardianship.  Per discussion with TOC, has contacted APS. Psychiatry was consulted.  Their input on 03/31/24 appreciated and they have clearly indicated that patient lacks medical decision making capacity and cannot sign out AMA.   HFrEF/CAD - 2D echo with a EF < 20%. - Euvolemic. - Continue Lasix , losartan , Coreg , spironolactone , Crestor . - Cardiology was following earlier on during the hospitalization. -It is felt  that patient is not a candidate for invasive workup due to extensive substance abuse and active use of cocaine. - Will need outpatient follow-up with cardiology. Since patient is on Lasix  and losartan , no labs since 4/23, will recheck BMP in AM.  Hypertension - Continue Lasix , losartan , Coreg , spironolactone .  Controlled.   Hyperlipidemia - Continue Crestor .    Well-controlled diabetes mellitus type 2 -Hemoglobin A1c 5.8 (01/02/2024) - Outpatient follow-up.   Polysubstance abuse - UDS positive for cocaine and benzos.   - Polysubstance cessation.     Debility/deconditioning -Patient seen by PT/OT initially recommending SNF placement on discharge.  However on most recent evaluation, PT recommends home health PT and OT recommends SNF. - Patient status post right AKA, left BKA and noted to be declining SNF. - Difficult disposition due to lack of capacity-see discussion above.   Discussed in detail with TOC.  They indicate that patient has a caregiver that used to come home about 2.5 hours/day.  They are planning to evaluate the patient in the hospital on 5/1 to let us  know how close or away is he from his baseline.  TOC has already contacted APS.  Patient continues to refuse SNF.  Difficult SNF placement due to his Medicaid not covering SNF.     Body mass index is 15.46 kg/m.   DVT prophylaxis:   Apixaban  anticoagulation   Code Status: Full Code:  Family Communication: None at bedside Disposition:  Status is: Inpatient Remains inpatient appropriate because: Awaiting  safe disposition.  Otherwise stable for DC from medical standpoint point.  Please see note above.     Consultants:   Neurology:  Cardiology:  PCCM:  Palliative care:  Psychiatry:  Procedures:     Subjective:  Denies complaints.  States that he watches animal planet on TV.  Objective:   Vitals:   04/01/24 1637 04/01/24 1944 04/01/24 2216 04/02/24 0851  BP: 117/81 113/71  130/86  Pulse: 75 72  73  Resp:  20   19  Temp: 98.5 F (36.9 C) 97.8 F (36.6 C)  (!) 97.5 F (36.4 C)  TempSrc: Oral Oral  Oral  SpO2: 98% 98% 97% 100%  Weight:      Height:       Patient has remained stable without any significant changes in his exam over the last several days.  General exam: Young male, looks older than stated age, sitting up comfortably in bed without distress.  Eating breakfast. Respiratory system: Clear to auscultation.  No increased work of breathing. CVS: S1 and S2 heard, RRR.  No JVD, murmurs or pedal edema. Abdomen: Nondistended and soft.  No organomegaly or masses appreciated.  Normal bowel sounds heard. CNS: Alert and oriented x 2.  No obvious cranial nerve deficits apart from speech abnormality noted above. Extremities: Seems to be moving all limbs symmetrically.  Healed stumps of right AKA and left BKA. Psychiatry: Judgment and insight impaired.  This morning patient was calm and cooperative..    Data Reviewed:   I have personally reviewed following labs and imaging studies   CBC: No results for input(s): "WBC", "NEUTROABS", "HGB", "HCT", "MCV", "PLT" in the last 168 hours.  Basic Metabolic Panel: No results for input(s): "NA", "K", "CL", "CO2", "GLUCOSE", "BUN", "CREATININE", "CALCIUM ", "MG", "PHOS" in the last 168 hours.  Liver Function Tests: No results for input(s): "AST", "ALT", "ALKPHOS", "BILITOT", "PROT", "ALBUMIN" in the last 168 hours.  CBG: No results for input(s): "GLUCAP" in the last 168 hours.  Microbiology Studies:  No results found for this or any previous visit (from the past 240 hours).  Radiology Studies:  No results found.  Scheduled Meds:    apixaban   5 mg Oral BID   arformoterol   15 mcg Nebulization BID   budesonide  (PULMICORT ) nebulizer solution  0.5 mg Nebulization BID   carvedilol   6.25 mg Oral BID WC   feeding supplement  237 mL Oral BID BM   furosemide   40 mg Oral Daily   gabapentin   200 mg Oral BID   haloperidol  lactate  5 mg Intramuscular  Once   losartan   50 mg Oral Daily   lurasidone   40 mg Oral Q breakfast   nicotine   14 mg Transdermal Daily   pantoprazole   40 mg Oral Daily   revefenacin   175 mcg Nebulization Daily   rosuvastatin   5 mg Oral QHS   spironolactone   12.5 mg Oral Daily    Continuous Infusions:     LOS: 29 days     Aubrey Blas, MD,  FACP, [facility], [facility], [facility]   [facility] [place]      To contact the attending provider between 7A-7P or the covering provider during after hours 7P-7A, please log into the web site [email or web address] and access using universal  password for that web site. If you do not have the password, please call the hospital operator.  04/02/2024, 4:08 PM

## 2024-04-02 NOTE — Plan of Care (Signed)
  Problem: Education: Goal: Knowledge of patient specific risk factors will improve (DELETE if not current risk factor) Outcome: Progressing   Problem: Ischemic Stroke/TIA Tissue Perfusion: Goal: Complications of ischemic stroke/TIA will be minimized Outcome: Progressing   Problem: Health Behavior/Discharge Planning: Goal: Ability to manage health-related needs will improve Outcome: Progressing   Problem: Self-Care: Goal: Verbalization of feelings and concerns over difficulty with self-care will improve Outcome: Progressing Goal: Ability to communicate needs accurately will improve Outcome: Progressing

## 2024-04-03 DIAGNOSIS — I639 Cerebral infarction, unspecified: Secondary | ICD-10-CM | POA: Diagnosis not present

## 2024-04-03 NOTE — Progress Notes (Signed)
 PROGRESS NOTE   [name]  JXB:[id number]    DOB: Jul 21, 1966    DOA: 03/04/2024  PCP: Abraham Abo, MD   I have briefly reviewed patients previous medical records in [facility].   Brief Hospital Course:  57-year male with history of cardiomyopathy, current cocaine abuse who presented with complaint of slurred speech, confusion, right-sided weakness.  Code stroke was called.  CT head showed left MCA infarct, CTA head and neck showed M2 occlusion on the left.  Patient was admitted for stroke workup.  Neurology consulted.  Echo showed large mobile left ventricular thrombus, left atrial thrombus, right ventricular apical thrombus with EF of 20%.  Cardiology involved.  Left ventricular thrombus was more than 3 cm in size.  Patient transferred out of ICU on 03/07/2024 to TRH service.  Stroke workup completed.  PT/OT, recommend SNF on discharge but he wants to go home.He has poor support, his bilateral BKA and also lacks capacity (Psychiatry clearly documented this on 03/31/2024). He cannot be discharged to home. TOC closely assisting.  Since patient lacks medical decision-making capacity, he cannot sign out AMA and if he threatens to sign out AMA, he will need to be involuntarily committed.  Given difficult and limited discharge options due to substance abuse, insurance issues, likely will be a prolonged hospital stay.  Assessment & Plan:   Acute left MCA stroke -Patient presented with slurred speech, confusion, right-sided weakness. - CT head done with left MCA territory infarct affecting the insula, caudate nucleus, lentiform nucleus, internal capsule.  Asymmetric hyperdensity of the M1/proximal M2 left MCA suspicious for presence of thrombus. - CT angiogram head and neck done with common carotid and internal carotid arteries patent within the neck.  Atherosclerotic plaque bilaterally.  Severe > 90% stenosis at the right ICA origin which is similar to prior CTA of 02/03/2022.  Vertebral arteries  patent in the neck.  Progressive severe stenosis of left vertebral artery proximal V1 segment.  Aortic atherosclerosis and emphysema. - CT angio head also with acute occlusion of proximal M2 left MCA vessel.Intracranial atherosclerotic disease with multifocal stenosis most notably as follows.  Severe near occlusive stenosis of the intracranial left vertebral artery new from prior CTA 02/03/2022.  Severe stenosis of right posterior cerebral artery branch at the P3/P4 junction, also new from prior exam. - CT perfusion identified regions of hypoperfusion totaling 117 mL.  Foci predominantly located within the left MCA vascular territory although some foci are present within the right cerebral watershed territory.  Perfusion software identifies no core infarct.  However acute infarct demonstrated on noncontrast head CT performed.  Perfusion software reports a mismatch volume of 117 mL.  Foci of hypoperfusion detected within the right cerebral watershed territory likely related to high-grade stenosis of the proximal cervical right ICA. - MRI brain with acute infarct of the left basal ganglia and insula with punctuate foci that extend to the subcortical white matter of the left frontal and parietal lobes.  No hemorrhage or mass effect.  Multiple old cerebellar infarcts and findings of chronic small vessel ischemia. - 2D echo noted a large mobile clot in the LV appears to be attached to the intraventricular septum just under the mitral valve.  LVEF <20%.  Left ventricle with severely decreased function with global hypokinesis.  Small RV apical clot.  Right ventricular systolic function severely reduced.  Large immobile clot in the LA.  Right and left atrium mildly dilated. - LDL of 71.  Hemoglobin A1c 5.8. - Continue Eliquis  as  patient has large mobile intracardiac thrombi. - PT recommends home health whereas OT recommends SNF.  See discussion below regarding disposition.   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 - 2D echo noted a large mobile clot in the LV appears to be attached to the intraventricular septum just under the mitral valve.  LVEF <20%.  Left ventricle with severely decreased function with global hypokinesis.  Small RV apical clot.  Right ventricular systolic function severely reduced.  Large immobile clot in the LA.  Right and left atrium mildly dilated. - Patient initially admitted to the ICU started on anticoagulation. - Hypercoagulable workup noted low Antithrombin activity, low protein C activity. - Continue Eliquis .   - Was being followed by cardiology early on in the hospitalization who have signed off. - Outpatient follow-up with cardiology.   Acute on chronic hypoxic respiratory failure Reportedly on chronic oxygen  2 L/min but currently saturating in the mid to high 90s on room air.   Agitation/lack of capacity -Patient was being followed by psychiatry during the hospitalization. - It is felt patient meets criteria for IVC based on his lack of insight, aggressive behavior. - Patient currently not on IVC however if patient wants to leave will need to be placed on IVC. - Patient noted to intermittently refuse care. - Patient started on Latuda  and gabapentin  which we will continue. - Will need guardianship.  Per discussion with TOC, has contacted [facility]. Psychiatry was consulted.  Their input on 03/31/24 appreciated and they have clearly indicated that patient lacks medical decision making capacity and cannot sign out AMA.   HFrEF/CAD - 2D echo with a EF < 20%. - Euvolemic. - Continue Lasix , losartan , Coreg , spironolactone , Crestor . - Cardiology was following earlier on during the hospitalization. -It is felt  that patient is not a candidate for invasive workup due to extensive substance abuse and active use of cocaine. - Will need outpatient follow-up with cardiology. Since patient is on Lasix  and losartan , no labs since 4/23, will recheck BMP in AM.  Hypertension - Continue Lasix , losartan , Coreg , spironolactone .  Controlled.   Hyperlipidemia - Continue Crestor .    Well-controlled diabetes mellitus type 2 -Hemoglobin A1c 5.8 (01/02/2024) - Outpatient follow-up.   Polysubstance abuse - UDS positive for cocaine and benzos.   - Polysubstance cessation.     Debility/deconditioning -Patient seen by PT/OT initially recommending SNF placement on discharge.  However on most recent evaluation, PT recommends home health PT and OT recommends SNF. - Patient status post right AKA, left BKA and noted to be declining SNF. - Difficult disposition due to lack of capacity-see discussion above.   Discussed in detail with TOC.  They indicate that patient has a caregiver that used to come home about 2.5 hours/day.  They are planning to evaluate the patient in the hospital on 5/1 to let us  know how close or away is he from his baseline.  TOC has already contacted [facility]PS.  Patient continues to refuse SNF.  Difficult SNF placement due to his Medicaid not covering SNF.     Body mass index is 15.46 kg/m.   DVT prophylaxis:   Apixaban  anticoagulation   Code Status: Full Code:  Family Communication: None at bedside Disposition:  Status is: Inpatient Remains inpatient appropriate because: Awaiting  safe disposition.  Otherwise stable for DC from medical standpoint point.  Please see note above.     Consultants:   Neurology:  Cardiology:  PCCM:  Palliative care:  Psychiatry:  Procedures:     Subjective:  Has remained without new complaints.  Tolerating diet well.  Objective:   Vitals:   04/01/24 2216 04/02/24 0851 04/02/24 1945 04/03/24 0758  BP:  130/86 118/85 122/86  Pulse:  73 77 68  Resp:  19   19  Temp:  (!) 97.5 F (36.4 C) 98.4 F (36.9 C) 97.6 F (36.4 C)  TempSrc:  Oral Oral Oral  SpO2: 97% 100% 95% 97%  Weight:      Height:       Patient has remained stable without any significant changes in his exam over the last several days.  General exam: Young male, looks older than stated age, sitting up comfortably in bed without distress.  Eating breakfast. Respiratory system: Clear to auscultation.  No increased work of breathing. CVS: S1 and S2 heard, RRR.  No JVD, murmurs or pedal edema. Abdomen: Nondistended and soft.  No organomegaly or masses appreciated.  Normal bowel sounds heard. CNS: Alert and oriented x 2.  No obvious cranial nerve deficits apart from speech abnormality noted above. Extremities: Seems to be moving all limbs symmetrically.  Healed stumps of right AKA and left BKA. Psychiatry: Judgment and insight impaired.  This morning patient was calm and cooperative..    Data Reviewed:   I have personally reviewed following labs and imaging studies   CBC: No results for input(s): "WBC", "NEUTROABS", "HGB", "HCT", "MCV", "PLT" in the last 168 hours.  Basic Metabolic Panel: No results for input(s): "NA", "K", "CL", "CO2", "GLUCOSE", "BUN", "CREATININE", "CALCIUM ", "MG", "PHOS" in the last 168 hours.  Liver Function Tests: No results for input(s): "AST", "ALT", "ALKPHOS", "BILITOT", "PROT", "ALBUMIN" in the last 168 hours.  CBG: No results for input(s): "GLUCAP" in the last 168 hours.  Microbiology Studies:  No results found for this or any previous visit (from the past 240 hours).  Radiology Studies:  No results found.  Scheduled Meds:    apixaban   5 mg Oral BID   arformoterol   15 mcg Nebulization BID   budesonide  (PULMICORT ) nebulizer solution  0.5 mg Nebulization BID   carvedilol   6.25 mg Oral BID WC   feeding supplement  237 mL Oral BID BM   furosemide   40 mg Oral Daily   gabapentin   200 mg Oral BID   haloperidol  lactate  5 mg Intramuscular Once    losartan   50 mg Oral Daily   lurasidone   40 mg Oral Q breakfast   nicotine   14 mg Transdermal Daily   pantoprazole   40 mg Oral Daily   revefenacin   175 mcg Nebulization Daily   rosuvastatin   5 mg Oral QHS   spironolactone   12.5 mg Oral Daily    Continuous Infusions:     LOS: 30 days     Aubrey Blas, MD,  FACP, [facility], [facility]   [facility] [place]      To contact the attending provider between 7A-7P or the covering provider during after hours 7P-7A, please log into the web site [email or web address] and access using universal  password for that web site. If you do not have the password, please call the hospital operator.  04/03/2024, 3:54 PM

## 2024-04-03 NOTE — Plan of Care (Signed)
  Problem: Health Behavior/Discharge Planning: Goal: Ability to manage health-related needs will improve Outcome: Progressing Goal: Goals will be collaboratively established with patient/family Outcome: Progressing   Problem: Self-Care: Goal: Ability to participate in self-care as condition permits will improve Outcome: Progressing Goal: Ability to communicate needs accurately will improve Outcome: Progressing   Problem: Nutrition: Goal: Risk of aspiration will decrease Outcome: Progressing

## 2024-04-04 DIAGNOSIS — Z789 Other specified health status: Secondary | ICD-10-CM

## 2024-04-04 DIAGNOSIS — I639 Cerebral infarction, unspecified: Secondary | ICD-10-CM | POA: Diagnosis not present

## 2024-04-04 NOTE — Progress Notes (Signed)
 Patient is not wearing ID band. When RT went to scan meds for treatment patient wouldn't confirm brithdate just told me to look at his paper on the computer. Patient barely let me place the pulse ox on his finger to check vitals. Patient wouldn't allow RT to auscultate breath sounds.

## 2024-04-04 NOTE — Progress Notes (Signed)
 PROGRESS NOTE   [name]  ZOX:[id number]    DOB: [birth year]    DOA: 03/04/2024  PCP: Abraham Abo, MD   I have briefly reviewed patients previous medical records in [facility].   Brief Hospital Course:  57-year male with history of cardiomyopathy, current cocaine abuse who presented with complaint of slurred speech, confusion, right-sided weakness.  Code stroke was called.  CT head showed left MCA infarct, CTA head and neck showed M2 occlusion on the left.  Patient was admitted for stroke workup.  Neurology consulted.  Echo showed large mobile left ventricular thrombus, left atrial thrombus, right ventricular apical thrombus with EF of 20%.  Cardiology involved.  Left ventricular thrombus was more than 3 cm in size.  Patient transferred out of ICU on 03/07/2024 to TRH service.  Stroke workup completed.  PT/OT, recommend SNF on discharge but he wants to go home.He has poor support, his bilateral BKA and also lacks capacity (Psychiatry clearly documented this on 03/31/2024). He cannot be discharged to home. TOC closely assisting.  Since patient lacks medical decision-making capacity, he cannot sign out AMA and if he threatens to sign out AMA, he will need to be involuntarily committed.  Given difficult and limited discharge options due to substance abuse, insurance issues, likely will be a prolonged hospital stay.  Assessment & Plan:   Acute left MCA stroke -Patient presented with slurred speech, confusion, right-sided weakness. - CT head done with left MCA territory infarct affecting the insula, caudate nucleus, lentiform nucleus, internal capsule.  Asymmetric hyperdensity of the M1/proximal M2 left MCA suspicious for presence of thrombus. - CT angiogram head and neck done with common carotid and internal carotid arteries patent within the neck.  Atherosclerotic plaque bilaterally.  Severe > 90% stenosis at the right ICA origin which is similar to prior CTA of 02/03/2022.  Vertebral arteries  patent in the neck.  Progressive severe stenosis of left vertebral artery proximal V1 segment.  Aortic atherosclerosis and emphysema. - CT angio head also with acute occlusion of proximal M2 left MCA vessel.Intracranial atherosclerotic disease with multifocal stenosis most notably as follows.  Severe near occlusive stenosis of the intracranial left vertebral artery new from prior CTA 02/03/2022.  Severe stenosis of right posterior cerebral artery branch at the P3/P4 junction, also new from prior exam. - CT perfusion identified regions of hypoperfusion totaling 117 mL.  Foci predominantly located within the left MCA vascular territory although some foci are present within the right cerebral watershed territory.  Perfusion software identifies no core infarct.  However acute infarct demonstrated on noncontrast head CT performed.  Perfusion software reports a mismatch volume of 117 mL.  Foci of hypoperfusion detected within the right cerebral watershed territory likely related to high-grade stenosis of the proximal cervical right ICA. - MRI brain with acute infarct of the left basal ganglia and insula with punctuate foci that extend to the subcortical white matter of the left frontal and parietal lobes.  No hemorrhage or mass effect.  Multiple old cerebellar infarcts and findings of chronic small vessel ischemia. - 2D echo noted a large mobile clot in the LV appears to be attached to the intraventricular septum just under the mitral valve.  LVEF <20%.  Left ventricle with severely decreased function with global hypokinesis.  Small RV apical clot.  Right ventricular systolic function severely reduced.  Large immobile clot in the LA.  Right and left atrium mildly dilated. - LDL of 71.  Hemoglobin A1c 5.8. - Continue Eliquis  as  patient has large mobile intracardiac thrombi. - PT recommends home health whereas OT recommends SNF.  See discussion below regarding disposition. Do not know if and how much aphasia he has  that is affecting his communication ability.  Requested patient be given paper and pencil to see if he can communicate better with this.   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 - 2D echo noted a large mobile clot in the LV appears to be attached to the intraventricular septum just under the mitral valve.  LVEF <20%.  Left ventricle with severely decreased function with global hypokinesis.  Small RV apical clot.  Right ventricular systolic function severely reduced.  Large immobile clot in the LA.  Right and left atrium mildly dilated. - Patient initially admitted to the ICU started on anticoagulation. - Hypercoagulable workup noted low Antithrombin activity, low protein C activity. - Continue Eliquis .   - Was being followed by cardiology early on in the hospitalization who have signed off. - Outpatient follow-up with cardiology.   Acute on chronic hypoxic respiratory failure Reportedly on chronic oxygen  2 L/min but currently saturating in the mid to high 90s on room air.   Agitation/lack of capacity -Patient was being followed by psychiatry during the hospitalization. - It is felt patient meets criteria for IVC based on his lack of insight, aggressive behavior. - Patient currently not on IVC however if patient wants to leave will need to be placed on IVC. - Patient noted to intermittently refuse care. - Patient started on Latuda  and gabapentin  which we will continue. - Will need guardianship.  Per discussion with TOC, has contacted [facility]PS. Psychiatry was consulted.  Their input on 03/31/24 appreciated and they have clearly indicated that patient lacks medical decision making capacity and cannot sign out AMA.    HFrEF/CAD - 2D echo with a EF < 20%. - Euvolemic. - Continue Lasix , losartan , Coreg , spironolactone , Crestor . - Cardiology was following earlier on during the hospitalization. -It is felt that patient is not a candidate for invasive workup due to extensive substance abuse and active use of cocaine. - Will need outpatient follow-up with cardiology. Since patient is on Lasix  and losartan , no labs since 4/23, will recheck BMP in AM.  Hypertension - Continue Lasix , losartan , Coreg , spironolactone .  Controlled.   Hyperlipidemia - Continue Crestor .    Well-controlled diabetes mellitus type 2 -Hemoglobin A1c 5.8 (01/02/2024) - Outpatient follow-up.   Polysubstance abuse - UDS positive for cocaine and benzos.   - Polysubstance cessation.     Debility/deconditioning -Patient seen by PT/OT initially recommending SNF placement on discharge.  However on most recent evaluation, PT recommends home health PT and OT recommends SNF. - Patient status post right AKA, left BKA and noted to be declining SNF. - Difficult disposition due to lack of capacity-see discussion above.   Discussed in detail with TOC.  They indicate that patient has a caregiver that used to come home about 2.5 hours/day.  They are planning to evaluate the patient in the hospital on 5/1 to let us  know how close or away is he from his baseline.  TOC has already contacted [facility].  Patient continues to refuse SNF.  Difficult SNF placement due to his Medicaid not covering SNF.     Body mass index  is 20.09 kg/m.   DVT prophylaxis:   Apixaban  anticoagulation   Code Status: Full Code:  Family Communication: None at bedside Disposition:  Status is: Inpatient Remains inpatient appropriate because: Awaiting safe disposition.  Otherwise stable for DC from medical standpoint point.  Please see note above.     Consultants:   Neurology:  Cardiology:  PCCM:  Palliative care:  Psychiatry:  Procedures:     Subjective:  Patient  states that he has a couple of questions but when he starts to speak, he is unable to articulate any at all.  He seems frustrated by this.  He appropriately turns off the television when MD walked into the room.  Follow simple instructions.  Requested that he be given a pencil and paper to see if he can articulate better.  Objective:   Vitals:   04/02/24 1945 04/03/24 0758 04/03/24 1939 04/04/24 0500  BP: 118/85 122/86 (!) 145/85   Pulse: 77 68 73   Resp:  19 18   Temp: 98.4 F (36.9 C) 97.6 F (36.4 C) 98 F (36.7 C)   TempSrc: Oral Oral Oral   SpO2: 95% 97% 97%   Weight:    61.7 kg  Height:        General exam: Young male, looks older than stated age, sitting up comfortably in bed.  Did not appear in any distress. Respiratory system: Clear to auscultation.  No increased work of breathing. CVS: S1 and S2 heard, RRR.  No JVD, murmurs or pedal edema. Abdomen: Nondistended and soft.  No organomegaly or masses appreciated.  Normal bowel sounds heard. CNS: Alert and oriented x 2.  No obvious cranial nerve deficits apart from speech abnormality noted above.  Mild dysarthria.  No obvious facial asymmetry.  Significant speech impediment-not sure if and how much aphasia he has. Extremities: Seems to be moving all limbs symmetrically.  Healed stumps of right AKA and left BKA. Psychiatry: Judgment and insight impaired.  This morning patient was calm and cooperative..    Data Reviewed:   I have personally reviewed following labs and imaging studies   CBC: No results for input(s): "WBC", "NEUTROABS", "HGB", "HCT", "MCV", "PLT" in the last 168 hours.  Basic Metabolic Panel: No results for input(s): "NA", "K", "CL", "CO2", "GLUCOSE", "BUN", "CREATININE", "CALCIUM ", "MG", "PHOS" in the last 168 hours.  Liver Function Tests: No results for input(s): "AST", "ALT", "ALKPHOS", "BILITOT", "PROT", "ALBUMIN" in the last 168 hours.  CBG: No results for input(s): "GLUCAP" in the last 168  hours.  Microbiology Studies:  No results found for this or any previous visit (from the past 240 hours).  Radiology Studies:  No results found.  Scheduled Meds:    apixaban   5 mg Oral BID   arformoterol   15 mcg Nebulization BID   budesonide  (PULMICORT ) nebulizer solution  0.5 mg Nebulization BID   carvedilol   6.25 mg Oral BID WC   feeding supplement  237 mL Oral BID BM   furosemide   40 mg Oral Daily   gabapentin   200 mg Oral BID   haloperidol  lactate  5 mg Intramuscular Once   losartan   50 mg Oral Daily   lurasidone   40 mg Oral Q breakfast   nicotine   14 mg Transdermal Daily   pantoprazole   40 mg Oral Daily   revefenacin   175 mcg Nebulization Daily   rosuvastatin   5 mg Oral QHS   spironolactone   12.5 mg Oral Daily    Continuous Infusions:     LOS: 31  days     Aubrey Blas, MD,  FACP, [facility], [facility], [facility]   [facility] [place]      To contact the attending provider between 7A-7P or the covering provider during after hours 7P-7A, please log into the web site [email or web address] and access using universal [place] password for that web site. If you do not have the password, please call the hospital operator.  04/04/2024, 9:31 AM

## 2024-04-04 NOTE — Progress Notes (Signed)
 PT Cancellation Note  Patient Details Name: [name] MRN: [id number] DOB: 07-Jul-1966   Cancelled Treatment:    Reason Eval/Treat Not Completed: Patient declined, no reason specified Geannie [name] I dont feel like it today")   Dalbert Stillings 04/04/2024, 2:19 PM

## 2024-04-05 DIAGNOSIS — F250 Schizoaffective disorder, bipolar type: Secondary | ICD-10-CM | POA: Diagnosis not present

## 2024-04-05 DIAGNOSIS — I639 Cerebral infarction, unspecified: Secondary | ICD-10-CM | POA: Diagnosis not present

## 2024-04-05 DIAGNOSIS — Z91199 Patient's noncompliance with other medical treatment and regimen due to unspecified reason: Secondary | ICD-10-CM

## 2024-04-05 NOTE — Progress Notes (Signed)
 Pt refuses to let me check vital signs during his breathing treatment.  Unable to check HR, breath sounds or oxygen  saturation.  No distress noted at this time.

## 2024-04-05 NOTE — Progress Notes (Addendum)
 Addendum:  Since I evaluated him this morning, patient insisted to the RN that he wanted to see the MD back in person.  He was refusing to take his p.o. meds and stated that he would take them only if he saw the MD in person  This afternoon, this MD evaluated the patient in his room along with his RN at bedside.  He complained to us  that all of us  come by and see him but no one is telling him anything.  As has been the case over the last week, it was difficult to understand his thought process.  Speech frequently was garbled, at times he would not complete a train of thought and jump to something else.  He refuses to write down his concerns or questions on the paper. I do not know if he is able to write or not.  I updated him that he is currently hospitalized because of an acute stroke most likely caused by large clot in his heart.  Advised him that he was being treated with a blood thinner/Eliquis  for same but he must take it regularly otherwise there is risk of recurrent stroke.  After initially refusing his Eliquis  this morning, he then subsequently took it.  As per RN report, it appears that at times when patient refuses his meds, when they go back later and offer it to him, he takes them.  We also explained that it was felt that he was unsafe to discharge home alone.  He continued to state that he did not want to go to nursing home for rehab.  He stated that there was somebody (unable to see who) from his home environment coming to see him tomorrow or day after and he was going to speak with them and he wanted the entire team taking care of him here to come and meet him together.  I advised him that we could try and do that  Discussed with Dr. Bethanie Brooking, Psychiatry regarding patient not taking Latuda  for a long time and if this was contributing to his refusal of taking other meds intermittently.  They will see patient and attempt to see if they can switch his Latuda  around to something else that he is  willing to take and that may help his behavioral health issues.  They advised that if we wanted to consider forced medication orders, he would need to be placed under involuntary commitment.  Since patient has been taking his Eliquis  in the most part except missing few doses, will continue to encourage and work around his behaviors to try and ensure that he takes his medication.  If that fails and even if we put him on subcutaneous Lovenox  or IV heparin  drip, he may refuse these and the related lab draws.  Difficult situation.  They also advised discussing with the ethics team.  Consult ethics team and discussed in detail with Dr. Salley Crawford who will follow-up.  [name] Blas, MD,  FACP, [facility], [facility], [facility]   [facility] [place]     To contact the attending provider between 7A-7P or the covering provider during after hours 7P-7A, please log into the web site [email or web address] and access using universal [place] password for that web site. If you do not have the password, please call the hospital operator.

## 2024-04-05 NOTE — Progress Notes (Signed)
 PT Cancellation Note  Patient Details Name: [name] MRN: [id number] DOB: 1966/08/18   Cancelled Treatment:    Reason Eval/Treat Not Completed: Patient declined, no reason specified ("I'm doing that today" as he motioned for this therapist to go away)   Helayne Metsker 04/05/2024, 2:07 PM

## 2024-04-05 NOTE — Progress Notes (Addendum)
 OT Cancellation Note  Patient Details Name: [name] MRN: [id number] DOB: [birth year]   Cancelled Treatment:    Reason Eval/Treat Not Completed: Patient declined, no reason specified. OT enters room and introduces self as OT and pt reporting he is waiting for "another lady in purple like me" but unable to specify who or why. Before OT engages in session planning, pt reporting "I am not doing anything, please just go". OT reporting to pt she will attempt to check back later. 11:41am   Addendum: Came to check back later 1530 and MD/RN in room with pt and pt politely requests try again another time. Will continue efforts.   Karilyn Ouch, OTR/L [facility] Acute Rehabilitation Office: [phone]   [name] 04/05/2024, 11:41 AM

## 2024-04-05 NOTE — Progress Notes (Signed)
 Received a call from bedside RN regarding the patient refusing to take his night medications, which include Eliquis .  Presented at bedside.  The patient is alert, verbal with speech impairment.  Due to his recent CVA about a month ago and with mild cognitive impairment, he lacks decision-making capacity.    The patient refused to take Eliquis  and refused to accept to take subcu Lovenox  for his intracardiac thrombi.   Time: 15 minutes.

## 2024-04-05 NOTE — Progress Notes (Signed)
 PROGRESS NOTE   [name]  WUJ:[id number]    DOB: 1966/09/20    DOA: 03/04/2024  PCP: Abraham Abo, MD   I have briefly reviewed patients previous medical records in [facility].   Brief Hospital Course:  57-year male with history of cardiomyopathy, current cocaine abuse who presented with complaint of slurred speech, confusion, right-sided weakness.  Code stroke was called.  CT head showed left MCA infarct, CTA head and neck showed M2 occlusion on the left.  Patient was admitted for stroke workup.  Neurology consulted.  Echo showed large mobile left ventricular thrombus, left atrial thrombus, right ventricular apical thrombus with EF of 20%.  Cardiology involved.  Left ventricular thrombus was more than 3 cm in size.  Patient transferred out of ICU on 03/07/2024 to TRH service.  Stroke workup completed.  PT/OT, recommend SNF on discharge but he wants to go home.He has poor support, his bilateral BKA and also lacks capacity (Psychiatry clearly documented this on 03/31/2024). He cannot be discharged to home. TOC closely assisting.  Since patient lacks medical decision-making capacity, he cannot sign out AMA and if he threatens to sign out AMA, he will need to be involuntarily committed.  Given difficult and limited discharge options due to substance abuse, insurance issues, likely will be a prolonged hospital stay.  Assessment & Plan:   Acute left MCA stroke -Patient presented with slurred speech, confusion, right-sided weakness. - CT head done with left MCA territory infarct affecting the insula, caudate nucleus, lentiform nucleus, internal capsule.  Asymmetric hyperdensity of the M1/proximal M2 left MCA suspicious for presence of thrombus. - CT angiogram head and neck done with common carotid and internal carotid arteries patent within the neck.  Atherosclerotic plaque bilaterally.  Severe > 90% stenosis at the right ICA origin which is similar to prior CTA of 02/03/2022.  Vertebral arteries  patent in the neck.  Progressive severe stenosis of left vertebral artery proximal V1 segment.  Aortic atherosclerosis and emphysema. - CT angio head also with acute occlusion of proximal M2 left MCA vessel.Intracranial atherosclerotic disease with multifocal stenosis most notably as follows.  Severe near occlusive stenosis of the intracranial left vertebral artery new from prior CTA 02/03/2022.  Severe stenosis of right posterior cerebral artery branch at the P3/P4 junction, also new from prior exam. - CT perfusion identified regions of hypoperfusion totaling 117 mL.  Foci predominantly located within the left MCA vascular territory although some foci are present within the right cerebral watershed territory.  Perfusion software identifies no core infarct.  However acute infarct demonstrated on noncontrast head CT performed.  Perfusion software reports a mismatch volume of 117 mL.  Foci of hypoperfusion detected within the right cerebral watershed territory likely related to high-grade stenosis of the proximal cervical right ICA. - MRI brain with acute infarct of the left basal ganglia and insula with punctuate foci that extend to the subcortical white matter of the left frontal and parietal lobes.  No hemorrhage or mass effect.  Multiple old cerebellar infarcts and findings of chronic small vessel ischemia. - 2D echo noted a large mobile clot in the LV appears to be attached to the intraventricular septum just under the mitral valve.  LVEF <20%.  Left ventricle with severely decreased function with global hypokinesis.  Small RV apical clot.  Right ventricular systolic function severely reduced.  Large immobile clot in the LA.  Right and left atrium mildly dilated. - LDL of 71.  Hemoglobin A1c 5.8. - Continue Eliquis  as  patient has large mobile intracardiac thrombi.  Upon review of his med rec, it appears that patient has intermittently refused to take his Eliquis  but I was only made aware after he refused this  morning's dose.  Overnight he also refused subcutaneous Lovenox .  Please see discussion below under agitation/lack of capacity - PT recommends home health whereas OT recommends SNF.  See discussion below regarding disposition. Do not know if and how much aphasia he has that is affecting his communication ability.  Despite having a paper and pencil, when asked to document his questions and concerns, he refuses repeatedly.   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 - 2D echo noted a large mobile clot in the LV appears to be attached to the intraventricular septum just under the mitral valve.  LVEF <20%.  Left ventricle with severely decreased function with global hypokinesis.  Small RV apical clot.  Right ventricular systolic function severely reduced.  Large immobile clot in the LA.  Right and left atrium mildly dilated. - Patient initially admitted to the ICU started on anticoagulation. - Hypercoagulable workup noted low Antithrombin activity, low protein C activity. - Continue Eliquis .  Patient refusing multiple doses of Eliquis . - Was being followed by cardiology early on in the hospitalization who have signed off. - Outpatient follow-up with cardiology.   Acute on chronic hypoxic respiratory failure Reportedly on chronic oxygen  2 L/min but currently saturating in the mid to high 90s on room air.   Schizoaffective disorder, bipolar type/agitation/lack of capacity -Patient was being followed by psychiatry during the hospitalization. - It is felt patient meets criteria for IVC based on his lack of insight, aggressive behavior. - Patient currently not on IVC however if patient wants to leave will need to be placed on  IVC. - Patient noted to intermittently refuse care. - Patient started on Latuda  and gabapentin  which we will continue. - Will need guardianship.  Per discussion with TOC, has contacted APS. Psychiatry was consulted.  Their input on 03/31/24 appreciated and they have clearly indicated that patient lacks medical decision making capacity and cannot sign out AMA. 5/6: Requested psychiatry consultation to see if some of his behavioral health meds can be given parenterally, may be long-acting IM preparation.  Interestingly, he appears to take some and refuse other medications.  This morning he took his carvedilol , gabapentin , Cozaar , Aldactone  but refused multiple other medications including Eliquis  and Latuda .   HFrEF/CAD - 2D echo with a EF < 20%. - Euvolemic. - Continue Lasix , losartan , Coreg , spironolactone , Crestor . - Cardiology was following earlier on during the hospitalization. -It is felt that patient is not a candidate for invasive workup due to extensive substance abuse and active use of cocaine. - Will need outpatient follow-up with cardiology. Since patient is on Lasix  and losartan , no labs since 4/23, will recheck BMP in AM.  I thought I had ordered labs,?  Discontinued all patient refused unsure.  Will reorder for tomorrow.  Hypertension - Continue Lasix , losartan , Coreg , spironolactone .  Controlled.   Hyperlipidemia - Continue Crestor .    Well-controlled diabetes mellitus type 2 -Hemoglobin A1c 5.8 (01/02/2024) - Outpatient follow-up.   Polysubstance abuse - UDS positive for cocaine and benzos.   - Polysubstance cessation.     Debility/deconditioning -Patient seen by  PT/OT initially recommending SNF placement on discharge.  However on most recent evaluation, PT recommends home health PT and OT recommends SNF. - Patient status post right AKA, left BKA and noted to be declining SNF. - Difficult disposition due to lack of capacity-see discussion above.   Discussed in detail  with TOC.  They indicate that patient has a caregiver that used to come home about 2.5 hours/day.  They are planning to evaluate the patient in the hospital on 5/1 to let us  know how close or away is he from his baseline.  TOC has already contacted APS.  Patient continues to refuse SNF.  Difficult SNF placement due to his Medicaid not covering SNF.     Body mass index is 20.09 kg/m.   DVT prophylaxis:   Apixaban  anticoagulation   Code Status: Full Code:  Family Communication: None at bedside Disposition:  Status is: Inpatient Remains inpatient appropriate because: Awaiting safe disposition.  Otherwise stable for DC from medical standpoint point.  Please see note above.     Consultants:   Neurology:  Cardiology:  PCCM:  Palliative care:  Psychiatry:  Procedures:     Subjective:  Unable to understand patient's speech.  Appeared pressured speech this morning.  Refuses to write his questions or concerns.  Has refused multiple meds.  Objective:   Vitals:   04/04/24 1955 04/04/24 2140 04/05/24 0817 04/05/24 1250  BP: 119/73  120/84 112/69  Pulse: 67  80 73  Resp: 18  18 18   Temp: 97.9 F (36.6 C)  97.6 F (36.4 C) 97.7 F (36.5 C)  TempSrc: Oral  Axillary Oral  SpO2: 99% 98% 96% 98%  Weight:      Height:        General exam: Young male, looks older than stated age, sitting up comfortably in bed.  Did not appear in any distress.  Did appear somewhat irritable. Respiratory system: Clear to auscultation.  No increased work of breathing. CVS: S1 and S2 heard, RRR.  No JVD, murmurs or pedal edema. Abdomen: Nondistended and soft.  No organomegaly or masses appreciated.  Normal bowel sounds heard. CNS: Alert and oriented x 2.  No obvious cranial nerve deficits apart from speech abnormality noted above.  Mild dysarthria.  No obvious facial asymmetry.  Significant speech impediment-not sure if and how much aphasia he has. Extremities: Seems to be moving all limbs symmetrically.   Healed stumps of right AKA and left BKA. Psychiatry: Judgment and insight impaired.  Irritable this morning.    Data Reviewed:   I have personally reviewed following labs and imaging studies   CBC: No results for input(s): "WBC", "NEUTROABS", "HGB", "HCT", "MCV", "PLT" in the last 168 hours.  Basic Metabolic Panel: No results for input(s): "NA", "K", "CL", "CO2", "GLUCOSE", "BUN", "CREATININE", "CALCIUM ", "MG", "PHOS" in the last 168 hours.  Liver Function Tests: No results for input(s): "AST", "ALT", "ALKPHOS", "BILITOT", "PROT", "ALBUMIN" in the last 168 hours.  CBG: No results for input(s): "GLUCAP" in the last 168 hours.  Microbiology Studies:  No results found for this or any previous visit (from the past 240 hours).  Radiology Studies:  No results found.  Scheduled Meds:    apixaban   5 mg Oral BID   arformoterol   15 mcg Nebulization BID   budesonide  (PULMICORT ) nebulizer solution  0.5 mg Nebulization BID   carvedilol   6.25 mg Oral BID WC   feeding supplement  237 mL Oral BID BM   furosemide   40 mg Oral Daily  gabapentin   200 mg Oral BID   losartan   50 mg Oral Daily   lurasidone   40 mg Oral Q breakfast   nicotine   14 mg Transdermal Daily   pantoprazole   40 mg Oral Daily   revefenacin   175 mcg Nebulization Daily   rosuvastatin   5 mg Oral QHS   spironolactone   12.5 mg Oral Daily    Continuous Infusions:     LOS: 32 days     Aubrey Blas, MD,  FACP, [facility], [facility], [facility]   [facility] [place]      To contact the attending provider between 7A-7P or the covering provider during after hours 7P-7A, please log into the web site [email or web address] and access using universal [place] password for that web site. If you do not have the password, please call the hospital operator.  04/05/2024, 1:31 PM

## 2024-04-06 DIAGNOSIS — F250 Schizoaffective disorder, bipolar type: Secondary | ICD-10-CM | POA: Diagnosis not present

## 2024-04-06 DIAGNOSIS — I639 Cerebral infarction, unspecified: Secondary | ICD-10-CM | POA: Diagnosis not present

## 2024-04-06 NOTE — Progress Notes (Signed)
 [name]  ZOX:[id number] DOB: 10/29/1966 DOA: 03/04/2024 PCP: Abraham Abo, MD    Brief Narrative:  58 year old with a history of cardiomyopathy and cocaine abuse who presented to the ER 03/04/2024 with slurred speech, confusion, and right-sided weakness.  CT head noted a left MCA infarct with CTa of the head and neck revealing an M2 occlusion on the left.  Echocardiogram revealed a large mobile left ventricular thrombus with left atrial thrombus as well as right ventricular apical thrombus and an EF of 20%.  PT/OT have recommended SNF placement on discharge.  The patient has been found to lack medical decision-making capacity such that he is not able to determine his discharge disposition nor is he allowed to sign out AMA.  Goals of Care:   Code Status: Full Code   DVT prophylaxis:  apixaban  (ELIQUIS ) tablet 5 mg   Interim Hx: The patient has been refusing nursing assessments, refused an EKG this morning, and refused to take any medications today.  At the time of my visit he was quite pleasant and interactive.  His speech is delayed due to his expressive aphasia.  At the time of my visit he is alert and oriented but his mental status reportedly waxes and wanes throughout the day.  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 -Has undergone extensive workup with CT revealing left MCA territory infarct affecting the insula, caudate nucleus, lentiform nucleus, and internal capsule - MRI confirmed these infarcts and also revealed evidence of multiple old cerebellar infarcts as well as findings of chronic small vessel ischemia -TTE noted a large mobile clot in the LV attached to the intraventricular septum just under the mitral valve as well as severely decreased global LV function with an EF of 20% as well as a small RV apical clot and severely reduced RV systolic function as well as a large and mobile clot in the left atrium -LDL 71 - A1c 5.8 - Continue Eliquis  for  intracardiac thrombus -counseled patient again today on need for very consistent Eliquis  compliance to avoid recurrent stroke and even possible death  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 Continue Eliquis  when patient will allow -appears euvolemic at present -continue Lasix , losartan , Coreg , Aldactone , and Crestor  -will need outpatient cardiology follow-up  Schizoaffective disorder bipolar type -agitation -lack of capacity Psychiatry has assisted in the care of this patient during his hospitalization -if patient attempts to leave the hospital he should be placed under involuntary commitment -TOC has contacted APS to pursue guardianship as patient lacks decision-making capacity  HTN BP presently controlled  HLD Continue Crestor   DM2 Well-controlled with A1c 5.8  Polysubstance abuse UDS positive for cocaine and benzodiazepines  Deconditioning Ambulatory status complicated by previous right AKA and left BKA -patient has no insurance coverage for SNF  Family Communication: No family present at time of exam, and TOC reports he does not have any family contacts Disposition: Difficult disposition due to incompetence -awaiting guardianship decision via APS   Objective: Blood pressure 129/81, pulse 72, temperature 97.7 F (36.5 C), temperature source Axillary, resp. rate 18, height 5\' 9"  (1.753 m), weight 61.7 kg, SpO2 98%.  Intake/Output Summary (Last 24 hours) at 04/06/2024 1038 Last data filed at 04/05/2024 1831 Gross per 24 hour  Intake 720 ml  Output 800 ml  Net -80 ml   Filed Weights   03/26/24 0431 03/28/24 0456 04/04/24 0500  Weight: 42.7 kg 47.5 kg 61.7 kg    Examination: General: No  acute respiratory distress Lungs: Clear to auscultation bilaterally without wheezes or crackles Cardiovascular: Regular rate and rhythm without murmur gallop or rub normal S1 and S2 Abdomen: Nontender, nondistended, soft, bowel sounds positive, no  rebound, no ascites, no appreciable mass Extremities: No significant cyanosis, clubbing, or edema bilateral lower extremities  CBC: No results for input(s): "WBC", "NEUTROABS", "HGB", "HCT", "MCV", "PLT" in the last 168 hours. Basic Metabolic Panel: No results for input(s): "NA", "K", "CL", "CO2", "GLUCOSE", "BUN", "CREATININE", "CALCIUM ", "MG", "PHOS" in the last 168 hours. GFR: Estimated Creatinine Clearance: 73.6 mL/min (by C-G formula based on SCr of 0.99 mg/dL).   Scheduled Meds:  apixaban   5 mg Oral BID   arformoterol   15 mcg Nebulization BID   budesonide  (PULMICORT ) nebulizer solution  0.5 mg Nebulization BID   carvedilol   6.25 mg Oral BID WC   feeding supplement  237 mL Oral BID BM   furosemide   40 mg Oral Daily   gabapentin   200 mg Oral BID   losartan   50 mg Oral Daily   lurasidone   40 mg Oral Q breakfast   nicotine   14 mg Transdermal Daily   pantoprazole   40 mg Oral Daily   revefenacin   175 mcg Nebulization Daily   rosuvastatin   5 mg Oral QHS   spironolactone   12.5 mg Oral Daily      LOS: 33 days   Abbe Abate, MD [facility] Office  [phone] Pager - Text Page per [place]  If 7PM-7AM, please contact night-coverage per Amion 04/06/2024, 10:38 AM

## 2024-04-06 NOTE — Progress Notes (Signed)
   04/06/24 2000  Vitals  Temp  (Refused)   Patient refused to allow staff to measure his vitals signs.  I attempted to educate him on the need to monitor his vital signs as part of his care and he still refused. Patient is alert and oriented x 3 and no distress is noted at this time.

## 2024-04-06 NOTE — Consult Note (Signed)
  Psychiatric Consult   Consult for Capacity Evaluation: Acute stroke, embolic from large intracardiac clots, refusing Eliquis .  Schizoaffective d/o, lacks MDM capacity, has consistently refused to take Latuda , consider parenteral/IM, possibly long-acting BHH medications.   [name] was seen today and evaluated specifically for the question of his ability to participate in discharge planning.  1) Decision being assessed:ability to refuse medications In an evaluation of capacity, each of the following criteria must be met based for a patient to have capacity to make the decision in question.   Criterion 1: The patient demonstrates a clear and consistent voluntary choice with regard to treatment options.  Criterion 2: The patient adequately understands the disease they have, the treatment proposed, the risks of treatment, and the risks of other treatment (including no treatment).  Criterion 3: The patient acknowledges that the details of Criterion 2 apply to them specifically and the likely consequences of treatment options proposed.  Criterion 4: The patient demonstrates adequate reasoning/rationality within the context of their decision and can provide justification for their choice.   In this case, the patient [name] does not meet any of the criteria are described above and does not understand acknowledge or demonstrate adequate reasoning or rationality. It is my opinion that [name] DOES NOT have capacity to participate in discharge planning   See patient interview below for details. Of note, this capacity evaluation assesses only for the specified decision documented above at the time of the assessment and is not a substitute for determination of the patient's overall competency, which can only be adjudicated.    2) Mental Capacity Assessment: I have evaluated the following areas to assess the [name] mental  capacity regarding medical decision-making ability which pertains to competency to discharge.   The specific treatment or service in question is: Ability to refuse medications  Communication: The patient was unable to clearly state preferred treatment options for his stroke prevention medications. Understanding: The patient was unable to recall information, link causal relationships, and process general probabilities regarding life situations and medical treatment scenarios. He is not future oriented and unable to provide any related scenarios regarding understanding of current proposed treatment, consequences related to refusal of treatment, and unsafe disposition.. The patient did present with impairments in memory, attention span, or intelligence.   Appreciation: The patient was unable to identify and describe his various illnesses and treatment options with potential outcomes. The patient did not present with concerns such as denial or delusional thought-process.    Rationalization: The patient was unable to weigh risks and benefits and come to a conclusion congruent with patient's perceived goals. Concerns regarding this category are: depression, acute/chronic encephalopathy, alcohol withdrawal, cognitive delay, and traumatic brain injury,  In conclusion, the patient is-not experiencing an acute medical scenario: ICC/SAH.    Conclusion: At this time, there is sufficient evidence to warrant removal of the patient's rights for medical decision-making as it pertains to discharging home.  He can NOT clearly determine mental capacity for decision-making unsure if this is secondary to TBI and or pre-existing cognitive delay.  At this time, we can determine that the patient does NOT have functional mental capacity for medical decision-making including the right to discharge home with no safe disposition.    Psych consult was placed for mental capacity, as patient insight and judgement continue to  fluctuate throughout this hospital admission. In terms of decision making capacity, patient lacks insight, appreciation, understanding, rationalization, communication, consequences, and is unable to link  causal relationships. This is evident by his inability to weigh his current physical limitations, housing limitation and need for physical therapy services. Therefore he lacks capacity as he is unable to appreciate the effects of treatment and his need for ongoing services.    A substitute decision maker must be sought to authorize medical intervention or to refuse treatment on behalf of the patient; for patients with advance directives, either the treatment choice that the patient made in advance or the choice of a surrogate decision maker may be indicated. In the absence of an advance directive and when time is available, the recommendation is usually to contact family members (the priority order to be approached is the spouse, adult children, parents, siblings, and other relatives).  Surrogate decision making for consent to make medical decisions for patient: The medical team has well-founded concerns regarding  [name] capacity to provide consent for treatment/ability to refuse medications The medical team, together with the social work team, should by good faith attempt to find and contact any family or friends who may be able to make decisions on this patient's behalf.  While legal guardianship is ideal for the long-term benefit of a patient in this situation, there is no appointed guardian for [name] yet.   Medical Decision-Making Capacity: Patient does not have capacity to refuse medications.  Patient does not meet criteria for involuntary commitment and forced medication protocols.   He has been compliant with blood thinners yesterday and today.  Patient's understanding of need for medication is limited by past stroke.  He does have some ability to comprehend discussion however  his dysphasia makes it difficult for him to verbalize his understanding. Recall testing completed at time of assessment demonstrated patient having 1 minute recall but not 5-minute recall of discussion.  He was not oriented to date and time. The patient did exhibit some components of confabulation.  He stated we could contact [name], his "helper" or his nephew when he calls "ant man".  He states that his nephew would be his POA in the event that he was unable to speak for himself.  He states that he has an aide that comes for 2 hours 7 days a week, and also has a cousin that can help.  He states that he is still able to cook on his own and complete his ADLs.  He states that his helpers provide him with a pillbox.  Social work team has attempted to reach any family members per patient and note that patient has previously not provided any names of family members that could be contacted for his ongoing care. He does admit to craving alcohol and cocaine at this time and believes that he would use crack and drink alcohol if he is discharged.  Patient has been not continued on Latuda  while hospitalized.  I suspect that mood stabilizers are needed in the context of his substance use.  Patient specifically denies any SI, HI, AVH today I will plan to discontinue Latuda  at this time and continue to monitor peripherally for worsening behavior.   It is important to note that decision-making capacity is assessed for a specific decision at a specific moment in time. The reason for this is different decisions have different risks and benefits. The threshold for an individual to display adequate decision-making capacity to provide consent to or refuse treatment should reflect the risks and benefits of that specific decision. Furthermore, decision-making capacity is time sensitive meaning if an individual does not have the  capacity to make a decision one day, this does not mean that they will not have the capacity to make the  same medical decision at a later time.  [name] was seen to assess capacity to participate in dc planning; found to not have capacity.. Notably, ethically supported(although not universally accepted) in psychiatry literature that refusal to participate de facto indicates lack of capacity regardless of reason for refusal. Pt did not engage in discussion of medications today. MOCA 23/30 indicating mild cognitive impairment - certainly does not explain extent of pt's refusal to engage.   I would recommend ethics consult if needed for assurance of patient taking medication. Social work has filed a report with adult protective services for evaluation of a guardian.  Patient does currently have a payee.  Recommend awaiting disposition until after Adult Protective Services has evaluated the patient and made recommendations regarding need for guardian.   Psychiatry will sign off.  Please re-consult for any future acute psychiatric concerns.  While future psychiatric events cannot be accurately predicted, the patient does not currently require acute inpatient psychiatric care and does not currently meet   involuntary commitment criteria.   [name] Bennetta Braun

## 2024-04-06 NOTE — Progress Notes (Signed)
 This encounter was created in error - please disregard.

## 2024-04-06 NOTE — Progress Notes (Signed)
 Physical Therapy Treatment Patient Details Name: [name] MRN: [id number] DOB: 1966/05/28 Today's Date: 04/06/2024   History of Present Illness Patient is 58 y.o. male presented to ED on 03/04/24 with stroke like symptoms when caregiver found him with slurred speech and right sided weakness. Head CT reveals Acute left MCA territory infarct affecting the insula, caudate nucleus, lentiform nucleus and internal capsule. PMH significant of  Schizoaffective disorder, heart failure with reduced ejection fraction, pretension, DMII, COPD, s/p bilateral amputations, chronic pain, and polysubstance abuse.    PT Comments  Continuing work on functional mobility and activity tolerance;  Session focused on functional transfers and wheelchair mobility, as well as discussion with pt about his current admission, diagnosis of CVA, and impairments to keep working on; [name] is well-known to Conservation officer, nature, and we were able to re-establish rapport relatively quickly; he was able to tell me that his RUE and his ability to speak were effected by the stroke, and that he is willing to work on these; He is also willing to work on his transfers, and he will consider doing exercises;   He got up to the wheelchair via anterior posterior transfer without physical assist, but needed safety cues for brakes and wheelchair position to transfer back to bed; [name] propelled the wheelchair without physicla assist, with decr efficiency, and notably decr wheel stroke on R compare to L, needing to occasionally correct trajectory;   [name] tried to remember the phone number of [place] (I'm unsure of her credentials), a city Product manager with his case; he remembered the digits 4, 0, and 3 -- all of which are in her phone number [phone]); Noted he frequently needed to stop to find words;   He has made functional progress during this admission; goals met and updated, including a floor transfer goal, which he wanted to work on in a previous  admission (can dc that goal if it is no longer meaningful to him)   If plan is discharge home, recommend the following: Two people to help with walking and/or transfers;Two people to help with bathing/dressing/bathroom;Assistance with cooking/housework;Assistance with feeding;Direct supervision/assist for medications management;Assist for transportation;Help with stairs or ramp for entrance;Supervision due to cognitive status   Can travel by private vehicle     No  Equipment Recommendations  None recommended by PT    Recommendations for Other Services       Precautions / Restrictions Precautions Precautions: Fall Recall of Precautions/Restrictions: Impaired Precaution/Restrictions Comments: h/o BLE amputations Restrictions Weight Bearing Restrictions Per Provider Order: No     Mobility  Bed Mobility Overal bed mobility: Modified Independent                  Transfers Overall transfer level: Needs assistance Equipment used: None Transfers: Bed to chair/wheelchair/BSC         Anterior-Posterior transfers: Contact guard assist   General transfer comment: CGA and verbal cues for safety with locking brakes and setup of w/c. Min A to return to bed due to fatigue and poor problem solving. Pt did have insight to request assist however.    Ambulation/Gait                   Psychologist, counselling mobility: Yes Wheelchair propulsion: Both upper extremities Wheelchair parts: Independent Distance: 150 Wheelchair Assistance Details (indicate cue type and reason): Cues for safety and locking brakes.Occasional bumping into objects on R; Increased difficulty  propelling WC with RUE notedMod reminders and cues for pathfinding to correct room   Tilt Bed    Modified Rankin (Stroke Patients Only) Modified Rankin (Stroke Patients Only) Pre-Morbid Rankin Score: Slight disability Modified Rankin: Moderately severe  disability     Balance Overall balance assessment: Needs assistance Sitting-balance support: Single extremity supported, Feet unsupported Sitting balance-Leahy Scale: Good Sitting balance - Comments: sitting EOB                                    Communication Communication Communication: Impaired Factors Affecting Communication: Difficulty expressing self;Reduced clarity of speech  Cognition Arousal: Alert Behavior During Therapy: WFL for tasks assessed/performed   PT - Cognitive impairments: Difficult to assess Difficult to assess due to: Impaired communication                     PT - Cognition Comments: 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 Following commands: Impaired Following commands impaired: Follows one step commands inconsistently, Follows one step commands with increased time    Cueing Cueing Techniques: Verbal cues, Tactile cues, Gestural cues  Exercises      General Comments General comments (skin integrity, edema, etc.): Back in room in bed end of session      Pertinent Vitals/Pain Pain Assessment Pain Assessment: No/denies pain Pain Intervention(s): Monitored during session    Home Living                          Prior Function            PT Goals (current goals can now be found in the care plan section) Acute Rehab PT Goals Patient Stated Goal: would like to be able to get home PT Goal Formulation: Patient unable to participate in goal setting Time For Goal Achievement: 04/20/24 Potential to Achieve Goals: Fair Additional Goals Additional Goal #1: Pt will complete floor to wheelchair transfers with min assist Progress towards PT goals: Goals met and updated - see care plan    Frequency    Min 2X/week      PT Plan      Co-evaluation              AM-PAC PT "6 Clicks" Mobility   Outcome Measure  Help needed turning from your back to your side while in a flat bed without using bedrails?: None Help needed moving from  lying on your back to sitting on the side of a flat bed without using bedrails?: None Help needed moving to and from a bed to a chair (including a wheelchair)?: A Little Help needed standing up from a chair using your arms (e.g., wheelchair or bedside chair)?: Total Help needed to walk in hospital room?: Total Help needed climbing 3-5 steps with a railing? : Total 6 Click Score: 14    End of Session   Activity Tolerance: Patient tolerated treatment well Patient left: in bed;with call bell/phone within reach;with bed alarm set Nurse Communication: Mobility status PT Visit Diagnosis: Other abnormalities of gait and mobility (R26.89);Muscle weakness (generalized) (M62.81);Other symptoms and signs involving the nervous system (R29.898);Hemiplegia and hemiparesis Hemiplegia - Right/Left: Right Hemiplegia - dominant/non-dominant: Dominant Hemiplegia - caused by: Cerebral infarction     Time: 1250-1333 PT Time Calculation (min) (ACUTE ONLY): 43 min  Charges:    $Therapeutic Activity: 8-22 mins $Self Care/Home Management: 8-22 $Wheel Chair Management: 8-22 mins PT General Charges $$  ACUTE PT VISIT: 1 Visit                     Darcus Eastern, PT  Acute Rehabilitation Services Office [phone] Secure Chat welcomed    Marcial Setting 04/06/2024, 3:13 PM

## 2024-04-06 NOTE — Progress Notes (Signed)
 Pt refused RN to complete assessment or NIHSS.  He also refused EKG this morning and all of his meds.  Pt very irritable but later apologized saying he would not take meds until he spoke with MD.  Dr. Jonelle Neri aware.

## 2024-04-07 ENCOUNTER — Ambulatory Visit: Payer: MEDICAID | Attending: Physician Assistant | Admitting: Physician Assistant

## 2024-04-07 DIAGNOSIS — I639 Cerebral infarction, unspecified: Secondary | ICD-10-CM | POA: Diagnosis not present

## 2024-04-07 MED ORDER — ENSURE ENLIVE PO LIQD
237.0000 mL | Freq: Two times a day (BID) | ORAL | Status: DC | PRN
  Administered 2024-08-05: 237 mL via ORAL

## 2024-04-07 MED ORDER — OXYCODONE HCL 5 MG PO TABS
10.0000 mg | ORAL_TABLET | ORAL | Status: DC | PRN
  Administered 2024-04-07 – 2024-04-09 (×6): 10 mg via ORAL
  Administered 2024-04-09: 2 mg via ORAL
  Administered 2024-04-09 – 2024-04-10 (×3): 10 mg via ORAL
  Filled 2024-04-07 (×10): qty 2

## 2024-04-07 NOTE — Progress Notes (Signed)
 Patient refused lab draw, assessments and VS again. All documentation visible in chart is what was able to be assessed.

## 2024-04-07 NOTE — Progress Notes (Signed)
   04/07/24 0400  Vitals  Temp  (Refused)   Patient refused to allow staff to measure his vitals signs. I attempted to educate him on the need to monitor his vital signs as part of his care and he still refused. Patient is alert and oriented x 3 and able to move all his extremities. No distress is noted at this time.

## 2024-04-07 NOTE — Progress Notes (Signed)
 Patient refused to let phlebotomy tech draw blood for labs after 2nd attempt today. MD notified.

## 2024-04-07 NOTE — Progress Notes (Signed)
 [name]  ZOX:[id number] DOB: [birth year] DOA: 03/04/2024 PCP: Abraham Abo, MD    Brief Narrative:  58 year old with a history of cardiomyopathy and cocaine abuse who presented to the ER 03/04/2024 with slurred speech, confusion, and right-sided weakness.  CT head noted a left MCA infarct with CTa of the head and neck revealing an M2 occlusion on the left.  Echocardiogram revealed a large mobile left ventricular thrombus with left atrial thrombus as well as right ventricular apical thrombus and an EF of 20%.  PT/OT have recommended SNF placement on discharge.  The patient has been found to lack medical decision-making capacity such that he is not able to determine his discharge disposition nor is he allowed to sign out AMA.  Goals of Care:   Code Status: Full Code   DVT prophylaxis:  apixaban  (ELIQUIS ) tablet 5 mg   Interim Hx: The patient continues to intermittently refuse vitals or labs or medications.  We had an extended conversation again today during which he told me he wanted to go home as soon as possible.  He clearly does not remember our conversations from yesterday nor is he retaining information regarding his medical condition or the importance of taking his medications.  I was able to convince him to take his Eliquis  and his cardiac meds when I was in the room but he has refused to take Protonix  ever again.  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 -Has undergone extensive workup with CT revealing left MCA territory infarct affecting the insula, caudate nucleus, lentiform nucleus, and internal capsule - MRI confirmed these infarcts and also revealed evidence of multiple old cerebellar infarcts as well as findings of chronic small vessel ischemia -TTE noted a large mobile clot in the LV attached to the intraventricular septum just under the mitral valve as well as severely decreased global LV function with an EF of 20% as well as a small RV apical  clot and severely reduced RV systolic function as well as a large and mobile clot in the left atrium -LDL 71 - A1c 5.8 - Continue Eliquis  for intracardiac thrombus -patient has been counseled on multiple occasions on need for very consistent Eliquis  compliance to avoid recurrent stroke and even possible death  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 Continue Eliquis  when patient will allow -no gross volume overload at present -continue Lasix , losartan , Coreg , Aldactone , and Crestor  -will need outpatient cardiology follow-up  Schizoaffective disorder bipolar type -agitation -lack of capacity Psychiatry has assisted in the care of this patient during his hospitalization -if patient attempts to leave the hospital he should be placed under involuntary commitment -TOC has contacted APS to pursue guardianship as patient lacks decision-making capacity -psychiatry is presently recommending we await APS decision on guardianship prior to attempting discharge  HTN Refusing vitals therefore unable to monitor  HLD Continue Crestor   DM2 Well-controlled with A1c 5.8  Polysubstance abuse UDS positive for cocaine and benzodiazepines  Deconditioning Ambulatory status complicated by previous right AKA and left BKA -patient has no insurance coverage for SNF  Family Communication: No family present at time of exam, and TOC reports he does not have any family contacts Disposition: Difficult disposition due to incompetence -awaiting guardianship decision via APS   Objective: Blood pressure 129/81, pulse 72, temperature 97.7 F (36.5 C), temperature source Axillary, resp. rate 18, height 5\' 9"  (1.753 m), weight 61.7 kg, SpO2 98%.  Intake/Output Summary (Last 24 hours) at 04/07/2024 8119 Last data  filed at 04/07/2024 0736 Gross per 24 hour  Intake --  Output 700 ml  Net -700 ml   Filed Weights   03/26/24 0431 03/28/24 0456 04/04/24 0500  Weight: 42.7 kg 47.5 kg  61.7 kg    Examination: General: No acute respiratory distress Lungs: Clear to auscultation bilaterally without wheezes or crackles Cardiovascular: Regular rate and rhythm without murmur  Abdomen: Nontender, nondistended, soft, bowel sounds positive Extremities: No significant edema lower extremities  CBC: No results for input(s): "WBC", "NEUTROABS", "HGB", "HCT", "MCV", "PLT" in the last 168 hours. Basic Metabolic Panel: No results for input(s): "NA", "K", "CL", "CO2", "GLUCOSE", "BUN", "CREATININE", "CALCIUM ", "MG", "PHOS" in the last 168 hours. GFR: Estimated Creatinine Clearance: 73.6 mL/min (by C-G formula based on SCr of 0.99 mg/dL).   Scheduled Meds:  apixaban   5 mg Oral BID   carvedilol   6.25 mg Oral BID WC   feeding supplement  237 mL Oral BID BM   furosemide   40 mg Oral Daily   gabapentin   200 mg Oral BID   losartan   50 mg Oral Daily   nicotine   14 mg Transdermal Daily   pantoprazole   40 mg Oral Daily   revefenacin   175 mcg Nebulization Daily   rosuvastatin   5 mg Oral QHS   spironolactone   12.5 mg Oral Daily      LOS: 34 days   Abbe Abate, MD [facility] Office  [phone] Pager - Text Page per Tilford Foley  If 7PM-7AM, please contact night-coverage per Amion 04/07/2024, 9:04 AM

## 2024-04-07 NOTE — Plan of Care (Signed)
   Problem: Ischemic Stroke/TIA Tissue Perfusion: Goal: Complications of ischemic stroke/TIA will be minimized Outcome: Progressing   Problem: Coping: Goal: Will verbalize positive feelings about self Outcome: Progressing

## 2024-04-07 NOTE — Progress Notes (Signed)
   04/07/24 0000  Vitals  Temp  (Refused)   Patient refused to allow staff to measure his vitals signs.  I attempted to educate him on the need to monitor his vital signs as part of his care and he still refused. Patient is alert and oriented x 3 and able to move all his extremities.  No distress is noted at this time.

## 2024-04-07 NOTE — Plan of Care (Signed)
  Problem: Nutrition: Goal: Risk of aspiration will decrease Outcome: Progressing Goal: Dietary intake will improve Outcome: Progressing   Problem: Education: Goal: Knowledge of disease or condition will improve Outcome: Not Progressing Goal: Knowledge of secondary prevention will improve (MUST DOCUMENT ALL) Outcome: Not Progressing Goal: Knowledge of patient specific risk factors will improve (DELETE if not current risk factor) Outcome: Not Progressing   Problem: Ischemic Stroke/TIA Tissue Perfusion: Goal: Complications of ischemic stroke/TIA will be minimized Outcome: Not Progressing   Problem: Coping: Goal: Will verbalize positive feelings about self Outcome: Not Progressing Goal: Will identify appropriate support needs Outcome: Not Progressing   Problem: Health Behavior/Discharge Planning: Goal: Ability to manage health-related needs will improve Outcome: Not Progressing Goal: Goals will be collaboratively established with patient/family Outcome: Not Progressing   Problem: Self-Care: Goal: Ability to participate in self-care as condition permits will improve Outcome: Not Progressing Goal: Verbalization of feelings and concerns over difficulty with self-care will improve Outcome: Not Progressing Goal: Ability to communicate needs accurately will improve Outcome: Not Progressing

## 2024-04-07 NOTE — Progress Notes (Signed)
 Patient refusing the rest of assessment on 2 separate occasions. Will re-attempt at later time.

## 2024-04-08 DIAGNOSIS — I639 Cerebral infarction, unspecified: Secondary | ICD-10-CM | POA: Diagnosis not present

## 2024-04-08 NOTE — TOC Progression Note (Signed)
 Transition of Care [facility]) - Progression Note    Patient Details  Name: [name] MRN: [id number] Date of Birth: December 09, 1965  Transition of Care [facility]) CM/SW Contact  Tandy Fam, [name] Phone Number: 04/08/2024, 3:39 PM  Clinical Narrative:   CSW coordinated with MD and psychiatry MD to discuss barriers related to patient's discharge and updates on guardianship. APS is pursuing guardianship due to patient lacking capacity, he is not safe to return home alone at this time. CSW to continue to follow.    Expected Discharge Plan: Skilled Nursing Facility Barriers to Discharge: Continued Medical Work up, English as a second language teacher, Active Substance Use - Placement, Inadequate or no insurance, SNF Pending payor source - LOG, SNF Pending Medicaid, SNF Pending bed offer  Expected Discharge Plan and Services       Living arrangements for the past 2 months: Apartment Expected Discharge Date: 03/14/24                                     Social Determinants of Health (SDOH) Interventions SDOH Screenings   Food Insecurity: Food Insecurity Present (03/06/2024)  Housing: High Risk (03/06/2024)  Transportation Needs: No Transportation Needs (03/06/2024)  Utilities: Patient Declined (03/06/2024)  Alcohol Screen: Medium Risk (06/24/2019)  Depression (PHQ2-9): Low Risk  (02/18/2024)  Recent Concern: Depression (PHQ2-9) - Medium Risk (01/21/2024)  Financial Resource Strain: High Risk (07/24/2021)   Received from [facility], [facility]  Physical Activity: Inactive (02/18/2024)  Social Connections: Unknown (04/05/2022)   Received from [facility], [facility]  Stress: Stress Concern Present (07/24/2021)   Received from [facility], [facility]  Tobacco Use: High Risk (03/04/2024)  Health Literacy: Adequate Health Literacy (02/18/2024)    Readmission Risk Interventions    12/21/2023    9:25 AM 12/09/2022    9:02 AM 12/01/2022    3:56 PM  Readmission Risk Prevention Plan   Transportation Screening Complete Complete Complete  HRI or Home Care Consult Complete    Social Work Consult for Recovery Care Planning/Counseling Complete    Palliative Care Screening Not Applicable    Medication Review Oceanographer) Complete Complete Complete  PCP or Specialist appointment within 3-5 days of discharge  Complete Complete  HRI or Home Care Consult  Complete Complete  SW Recovery Care/Counseling Consult  Complete Complete  Palliative Care Screening  Not Applicable Not Applicable  Skilled Nursing Facility  Not Applicable Not Applicable  SNF Comments   n/a

## 2024-04-08 NOTE — Progress Notes (Signed)
 OT Cancellation Note  Patient Details Name: [name] MRN: [id number] DOB: 1966-06-07   Cancelled Treatment:    Reason Eval/Treat Not Completed: Patient declined, no reason specified (attempted to engage with pt for therapy. He continues to refuse, stated that he is busy watching tv right now. Declined attempt to go over UE exercises. OT will follow-up with pt as able.) Left red theraband and squeezeball in room for pt to work on RUE deficits, pt has had several therapy refusals. Will make one more effort with a more favorable therapists then determine capacity for acute therapy.   04/08/2024  AB, OTR/L  Acute Rehabilitation Services  Office: [phone]   [name] 04/08/2024, 5:09 PM

## 2024-04-08 NOTE — Progress Notes (Signed)
 [name]  ZOX:[id number] DOB: 02/21/1966 DOA: 03/04/2024 PCP: Abraham Abo, MD    Brief Narrative:  58 year old with a history of cardiomyopathy and cocaine abuse who presented to the ER 03/04/2024 with slurred speech, confusion, and right-sided weakness.  CT head noted a left MCA infarct with CTa of the head and neck revealing an M2 occlusion on the left.  Echocardiogram revealed a large mobile left ventricular thrombus with left atrial thrombus as well as right ventricular apical thrombus and an EF of 20%.  PT/OT have recommended SNF placement on discharge.  The patient has been found to lack medical decision-making capacity such that he is not able to determine his discharge disposition nor is he allowed to sign out AMA.  Goals of Care:   Code Status: Full Code   DVT prophylaxis:  apixaban  (ELIQUIS ) tablet 5 mg   Interim Hx: No change in clinical status.  Continues to frequently refuse labs, vitals, and medications.  Asks me again today why he is in the hospital and why he is not able to go home and live independently like previously.  Denies new complaints.  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 -Has undergone extensive workup with CT revealing left MCA territory infarct affecting the insula, caudate nucleus, lentiform nucleus, and internal capsule - MRI confirmed these infarcts and also revealed evidence of multiple old cerebellar infarcts as well as findings of chronic small vessel ischemia -TTE noted a large mobile clot in the LV attached to the intraventricular septum just under the mitral valve as well as severely decreased global LV function with an EF of 20% as well as a small RV apical clot and severely reduced RV systolic function as well as a large and mobile clot in the left atrium -LDL 71 - A1c 5.8 - Continue Eliquis  for intracardiac thrombus -patient has been counseled on multiple occasions on need for very consistent Eliquis  compliance to  avoid recurrent stroke and even possible death  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 Continue Eliquis  when patient will allow -no gross volume overload at present -continue Lasix , losartan , Coreg , Aldactone , and Crestor  -will need outpatient cardiology follow-up  Schizoaffective disorder bipolar type -agitation -lack of capacity Psychiatry has assisted in the care of this patient during his hospitalization -if patient attempts to leave the hospital he should be placed under involuntary commitment -TOC has contacted APS to pursue guardianship as patient lacks decision-making capacity -psychiatry is presently recommending we await APS decision on guardianship prior to attempting discharge  HTN Blood pressure reasonably controlled at times when he will allow it to be checked  HLD Continue Crestor   DM2 Well-controlled with A1c 5.8 -CBG well-controlled  Polysubstance abuse UDS positive for cocaine and benzodiazepines  Deconditioning Ambulatory status complicated by previous right AKA and left BKA -patient has no insurance coverage for SNF  Family Communication: No family present at time of exam, and TOC reports he does not have any family contacts Disposition: Difficult disposition due to incompetence -awaiting guardianship decision via APS   Objective: Blood pressure (!) 136/96, pulse 66, temperature 97.8 F (36.6 C), temperature source Oral, resp. rate 18, height 5\' 9"  (1.753 m), weight 61.7 kg, SpO2 98%.  Intake/Output Summary (Last 24 hours) at 04/08/2024 0933 Last data filed at 04/08/2024 0843 Gross per 24 hour  Intake 240 ml  Output 700 ml  Net -460 ml   Filed Weights   03/26/24 0431 03/28/24 0456 04/04/24 0500  Weight: 42.7  kg 47.5 kg 61.7 kg    Examination: General: No acute respiratory distress Lungs: Clear to auscultation bilaterally Cardiovascular: Regular rate and rhythm without murmur  Abdomen: Nontender, nondistended, soft,  bowel sounds positive Extremities: No signif edema lower extremities  CBC: No results for input(s): "WBC", "NEUTROABS", "HGB", "HCT", "MCV", "PLT" in the last 168 hours. Basic Metabolic Panel: No results for input(s): "NA", "K", "CL", "CO2", "GLUCOSE", "BUN", "CREATININE", "CALCIUM ", "MG", "PHOS" in the last 168 hours. GFR: Estimated Creatinine Clearance: 73.6 mL/min (by C-G formula based on SCr of 0.99 mg/dL).   Scheduled Meds:  apixaban   5 mg Oral BID   carvedilol   6.25 mg Oral BID WC   furosemide   40 mg Oral Daily   gabapentin   200 mg Oral BID   losartan   50 mg Oral Daily   revefenacin   175 mcg Nebulization Daily   rosuvastatin   5 mg Oral QHS   spironolactone   12.5 mg Oral Daily      LOS: 35 days   Abbe Abate, MD [facility] Office  [phone] Pager - Text Page per Tilford Foley  If 7PM-7AM, please contact night-coverage per Amion 04/08/2024, 9:33 AM

## 2024-04-09 DIAGNOSIS — I639 Cerebral infarction, unspecified: Secondary | ICD-10-CM | POA: Diagnosis not present

## 2024-04-09 NOTE — Progress Notes (Signed)
 [name]  ZOX:[id number] DOB: 08-19-1966 DOA: 03/04/2024 PCP: Abraham Abo, MD    Brief Narrative:  58 year old with a history of cardiomyopathy and cocaine abuse who presented to the ER 03/04/2024 with slurred speech, confusion, and right-sided weakness.  CT head noted a left MCA infarct with CTa of the head and neck revealing an M2 occlusion on the left.  Echocardiogram revealed a large mobile left ventricular thrombus with left atrial thrombus as well as right ventricular apical thrombus and an EF of 20%.  PT/OT have recommended SNF placement on discharge.  The patient has been found to lack medical decision-making capacity such that he is not able to determine his discharge disposition nor is he allowed to sign out AMA.  Goals of Care:   Code Status: Full Code   DVT prophylaxis:  apixaban  (ELIQUIS ) tablet 5 mg   Interim Hx: No acute events reported overnight.  Afebrile.  Vital signs stable.  The patient is resting comfortably in bed at the time of my visit.  He is alert and pleasant.  He asked me again why he is still in the hospital and why he cannot just be discharged home.  I have repeated to him the same conversation we had yesterday when he asked me the same question.   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 -Has undergone extensive workup with CT revealing left MCA territory infarct affecting the insula, caudate nucleus, lentiform nucleus, and internal capsule - MRI confirmed these infarcts and also revealed evidence of multiple old cerebellar infarcts as well as findings of chronic small vessel ischemia -TTE noted a large mobile clot in the LV attached to the intraventricular septum just under the mitral valve as well as severely decreased global LV function with an EF of 20% as well as a small RV apical clot and severely reduced RV systolic function as well as a large and mobile clot in the left atrium -LDL 71 - A1c 5.8 - Continue Eliquis  for  intracardiac thrombus -patient has been counseled on multiple occasions on need for very consistent Eliquis  compliance to avoid recurrent stroke and even possible death  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 Continue Eliquis  when patient will allow -no gross volume overload at present -continue Lasix , losartan , Coreg , Aldactone , and Crestor  -will need outpatient cardiology follow-up  Schizoaffective disorder bipolar type -agitation -lack of capacity Psychiatry has assisted in the care of this patient during his hospitalization -if patient attempts to leave the hospital he should be placed under involuntary commitment -TOC has contacted APS to pursue guardianship as patient lacks decision-making capacity - Psychiatry is presently recommending we await APS decision on guardianship prior to attempting discharge  HTN Blood pressure reasonably controlled at times when he will allow it to be checked  HLD Continue Crestor   DM2 Well-controlled with A1c 5.8 -CBG well-controlled  Polysubstance abuse UDS positive for cocaine and benzodiazepines  Deconditioning Ambulatory status complicated by previous right AKA and left BKA -patient has no insurance coverage for SNF  Family Communication: No family present at time of exam, and TOC reports he does not have any family contacts Disposition: Difficult disposition due to incompetence -awaiting guardianship decision via APS   Objective: Blood pressure (!) 152/124, pulse 76, temperature 97.7 F (36.5 C), temperature source Oral, resp. rate 18, height 5\' 9"  (1.753 m), weight 61.7 kg, SpO2 100%. No intake or output data in the 24 hours ending 04/09/24 1018  [employer]  03/26/24 0431 03/28/24 0456 04/04/24 0500  Weight: 42.7 kg 47.5 kg 61.7 kg    Examination: General: No acute respiratory distress Lungs: Clear to auscultation bilaterally Cardiovascular: Regular rate and rhythm without murmur  Abdomen:  Nontender, nondistended, soft, bowel sounds positive Extremities: No signif edema lower extremities  CBC: No results for input(s): "WBC", "NEUTROABS", "HGB", "HCT", "MCV", "PLT" in the last 168 hours. Basic Metabolic Panel: No results for input(s): "NA", "K", "CL", "CO2", "GLUCOSE", "BUN", "CREATININE", "CALCIUM ", "MG", "PHOS" in the last 168 hours. GFR: Estimated Creatinine Clearance: 73.6 mL/min (by C-G formula based on SCr of 0.99 mg/dL).   Scheduled Meds:  apixaban   5 mg Oral BID   carvedilol   6.25 mg Oral BID WC   furosemide   40 mg Oral Daily   gabapentin   200 mg Oral BID   losartan   50 mg Oral Daily   revefenacin   175 mcg Nebulization Daily   rosuvastatin   5 mg Oral QHS   spironolactone   12.5 mg Oral Daily      LOS: 36 days   Abbe Abate, MD [facility] Office  [phone] Pager - Text Page per Tilford Foley  If 7PM-7AM, please contact night-coverage per Amion 04/09/2024, 10:18 AM

## 2024-04-10 DIAGNOSIS — I639 Cerebral infarction, unspecified: Secondary | ICD-10-CM | POA: Diagnosis not present

## 2024-04-10 LAB — BASIC METABOLIC PANEL WITH GFR
Anion gap: 10 (ref 5–15)
BUN: 21 mg/dL — ABNORMAL HIGH (ref 6–20)
CO2: 25 mmol/L (ref 22–32)
Calcium: 9.3 mg/dL (ref 8.9–10.3)
Chloride: 104 mmol/L (ref 98–111)
Creatinine, Ser: 1.03 mg/dL (ref 0.61–1.24)
GFR, Estimated: >60 mL/min (ref >60)
Glucose, Bld: 83 mg/dL (ref 70–99)
Potassium: 4.6 mmol/L (ref 3.5–5.1)
Sodium: 139 mmol/L (ref 135–145)

## 2024-04-10 LAB — TROPONIN I (HIGH SENSITIVITY): Troponin I (High Sensitivity): 35 ng/L — ABNORMAL HIGH (ref <18)

## 2024-04-10 LAB — CBC
HCT: 46.8 % (ref 39.0–52.0)
Hemoglobin: 15.6 g/dL (ref 13.0–17.0)
MCH: 30.6 pg (ref 26.0–34.0)
MCHC: 33.3 g/dL (ref 30.0–36.0)
MCV: 91.9 fL (ref 80.0–100.0)
Platelets: 196 10*3/uL (ref 150–400)
RBC: 5.09 MIL/uL (ref 4.22–5.81)
RDW: 14.8 % (ref 11.5–15.5)
WBC: 5.9 10*3/uL (ref 4.0–10.5)
nRBC: 0 % (ref 0.0–0.2)

## 2024-04-10 MED ORDER — NALOXONE HCL 0.4 MG/ML IJ SOLN: 0.4000 mg | INTRAMUSCULAR | Status: DC | PRN

## 2024-04-10 MED ORDER — MORPHINE SULFATE (PF) 2 MG/ML IV SOLN: 1.0000 mg | Freq: Once | INTRAVENOUS | Status: DC | PRN

## 2024-04-10 MED ORDER — SENNOSIDES-DOCUSATE SODIUM 8.6-50 MG PO TABS
1.0000 | ORAL_TABLET | Freq: Two times a day (BID) | ORAL | Status: DC
  Administered 2024-04-10 – 2024-04-26 (×8): 1 via ORAL
  Filled 2024-04-10 (×22): qty 1

## 2024-04-10 MED ORDER — ALBUTEROL SULFATE (2.5 MG/3ML) 0.083% IN NEBU
2.5000 mg | INHALATION_SOLUTION | Freq: Four times a day (QID) | RESPIRATORY_TRACT | Status: DC | PRN
  Administered 2024-04-10 – 2024-06-05 (×6): 2.5 mg via RESPIRATORY_TRACT
  Filled 2024-04-10 (×6): qty 3

## 2024-04-10 MED ORDER — OXYCODONE HCL 5 MG PO TABS
10.0000 mg | ORAL_TABLET | ORAL | Status: DC | PRN
  Administered 2024-04-10 (×3): 10 mg via ORAL
  Filled 2024-04-10 (×5): qty 2

## 2024-04-10 MED ORDER — FUROSEMIDE 40 MG PO TABS
40.0000 mg | ORAL_TABLET | Freq: Once | ORAL | Status: DC
  Filled 2024-04-10: qty 1

## 2024-04-10 NOTE — Progress Notes (Signed)
Pt is refusing AM labs.

## 2024-04-10 NOTE — Progress Notes (Signed)
 12:17 Patient complaining of chest pain 10/10. VSS taken and EKG done. Dr Michell Ahumada notified. Labs ordered by MD   0139 Patient started to be combative when phlebotomy tried to draw labs from him. MD notified

## 2024-04-10 NOTE — Progress Notes (Signed)
 Carol Bellus  ZOX:[id number] DOB: [birth year] DOA: 03/04/2024 PCP: Abraham Abo, MD    Brief Narrative:  58 year old with a history of cardiomyopathy and cocaine abuse who presented to the ER 03/04/2024 with slurred speech, confusion, and right-sided weakness.  CT head noted a left MCA infarct with CTa of the head and neck revealing an M2 occlusion on the left.  Echocardiogram revealed a large mobile left ventricular thrombus with left atrial thrombus as well as right ventricular apical thrombus and an EF of 20%.  PT/OT have recommended SNF placement on discharge.  The patient has been found to lack medical decision-making capacity such that he is not able to determine his discharge disposition nor is he allowed to sign out AMA.  Goals of Care:   Code Status: Full Code   DVT prophylaxis:  apixaban  (ELIQUIS ) tablet 5 mg   Interim Hx: The patient reported an episode of poorly described chest pain last night.  This resolved after he was given a dose of oxycodone .  An EKG was without acute changes.  The patient otherwise refused further evaluation.  He has had no further chest pain at this time.  Nursing notes that the patient has not had a bowel movement documented in multiple days.  As a result a bowel regimen has been initiated, with the stipulation that the patient is not allowed to have more narcotic unless he also complies with the bowel regimen.  Resting comfortably in bed at the time of my visit with no complaints whatsoever.  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 -Has undergone extensive workup with CT revealing left MCA territory infarct affecting the insula, caudate nucleus, lentiform nucleus, and internal capsule - MRI confirmed these infarcts and also revealed evidence of multiple old cerebellar infarcts as well as findings of chronic small vessel ischemia -TTE noted a large mobile clot in the LV attached to the intraventricular septum just under the  mitral valve as well as severely decreased global LV function with an EF of 20% as well as a small RV apical clot and severely reduced RV systolic function as well as a large and mobile clot in the left atrium -LDL 71 - A1c 5.8 - Continue Eliquis  for intracardiac thrombus -patient has been counseled on multiple occasions on need for very consistent Eliquis  compliance to avoid recurrent stroke and even possible death  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 Continue Eliquis  when patient will allow - no gross volume overload at present - continue Lasix , losartan , Coreg , Aldactone , and Crestor  - will need outpatient Cardiology follow-up  Schizoaffective disorder bipolar type -agitation -lack of capacity Psychiatry has assisted in the care of this patient during his hospitalization - if patient attempts to leave the hospital he should be placed under involuntary commitment -TOC has contacted APS to pursue guardianship as patient lacks decision-making capacity - Psychiatry is presently recommending we await APS decision on guardianship prior to attempting to discharge him  HTN Continue to monitor as patient will allow  HLD Continue Crestor   DM2 Well-controlled with A1c 5.8 - CBG well-controlled - does not appear to require medical therapy as an outpatient  Polysubstance abuse UDS positive for cocaine and benzodiazepines -has admitted to use of crack cocaine  Deconditioning Ambulatory status complicated by previous right AKA and left BKA -patient has no insurance coverage for SNF  Family Communication: No family present at time of exam, and TOC reports he does not have any family contacts Disposition:  Difficult disposition due to incompetence -awaiting guardianship decision via APS   Objective: Blood pressure (!) 151/112, pulse 70, temperature (!) 97.4 F (36.3 C), resp. rate 20, height 5\' 9"  (1.753 m), weight 61.7 kg, SpO2 100%.  Intake/Output  Summary (Last 24 hours) at 04/10/2024 0910 Last data filed at 04/09/2024 2000 Gross per 24 hour  Intake 480 ml  Output 1900 ml  Net -1420 ml    Filed Weights   03/26/24 0431 03/28/24 0456 04/04/24 0500  Weight: 42.7 kg 47.5 kg 61.7 kg    Examination: General: No acute respiratory distress Lungs: Clear to auscultation bilaterally -no wheezing or crackles Cardiovascular: RRR without murmur Abdomen: Nontender, nondistended, soft, bowel sounds positive Extremities: No signif edema lower extremities  CBC: Recent Labs  Lab 04/10/24 0729  WBC 5.9  HGB 15.6  HCT 46.8  MCV 91.9  PLT 196   Basic Metabolic Panel: Recent Labs  Lab 04/10/24 0729  NA 139  K 4.6  CL 104  CO2 25  GLUCOSE 83  BUN 21*  CREATININE 1.03  CALCIUM  9.3   GFR: Estimated Creatinine Clearance: 70.7 mL/min (by C-G formula based on SCr of 1.03 mg/dL).   Scheduled Meds:  apixaban   5 mg Oral BID   carvedilol   6.25 mg Oral BID WC   furosemide   40 mg Oral Daily   gabapentin   200 mg Oral BID   losartan   50 mg Oral Daily   revefenacin   175 mcg Nebulization Daily   rosuvastatin   5 mg Oral QHS   senna-docusate  1 tablet Oral BID   spironolactone   12.5 mg Oral Daily      LOS: 37 days   Abbe Abate, MD [facility] Office  [phone] Pager - Text Page per Tilford Foley  If 7PM-7AM, please contact night-coverage per Amion 04/10/2024, 9:10 AM

## 2024-04-10 NOTE — Plan of Care (Signed)
  Problem: Education: Goal: Knowledge of secondary prevention will improve (MUST DOCUMENT ALL) Outcome: Progressing Goal: Knowledge of patient specific risk factors will improve (DELETE if not current risk factor) Outcome: Progressing   Problem: Self-Care: Goal: Ability to participate in self-care as condition permits will improve Outcome: Progressing

## 2024-04-10 NOTE — Progress Notes (Signed)
 Overnight event  Patient had complained of 10 out of 10 chest pain which resolved after RN gave him oxycodone .  Vital signs stable.  EKG was done and showing slight T wave inversions in inferior and lateral leads.  T wave inversions in lateral leads seen on previous EKG as well.  Stat troponin was ordered to rule out ACS but patient became combative when phlebotomy tried to draw labs.  I have also spoken to the patient and explained to him that ACS is life-threatening and can result in death but he continues to refuse lab work.

## 2024-04-11 DIAGNOSIS — I639 Cerebral infarction, unspecified: Secondary | ICD-10-CM | POA: Diagnosis not present

## 2024-04-11 MED ORDER — OXYCODONE HCL 5 MG PO TABS
10.0000 mg | ORAL_TABLET | ORAL | Status: DC | PRN
  Administered 2024-04-11 – 2024-07-17 (×118): 10 mg via ORAL
  Filled 2024-04-11 (×121): qty 2

## 2024-04-11 NOTE — Plan of Care (Signed)
  Problem: Education: Goal: Knowledge of disease or condition will improve Outcome: Progressing   Problem: Ischemic Stroke/TIA Tissue Perfusion: Goal: Complications of ischemic stroke/TIA will be minimized Outcome: Progressing   Problem: Health Behavior/Discharge Planning: Goal: Ability to manage health-related needs will improve Outcome: Progressing   Problem: Self-Care: Goal: Ability to participate in self-care as condition permits will improve Outcome: Progressing

## 2024-04-11 NOTE — Progress Notes (Addendum)
 Pt has been angry and refusing all medications, VS, and assessments from RN this shift.  He refused to take senokot with his pain medication as ordered, and requested a non-narcotic.  MD aware, but pt has anaphylaxis listed for tylenol  and NSAIDS.  When RN told patient he said, "yall got me so f'd up, get out."  Attempted to reassure [name] but he is very irritable and angry today.

## 2024-04-11 NOTE — TOC Progression Note (Signed)
 Transition of Care [facility]) - Progression Note    Patient Details  Name: [name] MRN: [id number] Date of Birth: 14-Dec-1965  Transition of Care [facility]) CM/SW Contact  Tandy Fam, [name] Phone Number: 04/11/2024, 1:25 PM  Clinical Narrative:   CSW spoke with APS Worker Josefina Nian to discuss update on guardianship. Josefina Nian was not aware of need for guardianship, asking for documentation to support need for guardianship. CSW sent clinical notes documenting patient's lack of capacity. Awaiting response.    Expected Discharge Plan: Skilled Nursing Facility Barriers to Discharge: Continued Medical Work up, English as a second language teacher, Active Substance Use - Placement, Inadequate or no insurance, SNF Pending payor source - LOG, SNF Pending Medicaid, SNF Pending bed offer  Expected Discharge Plan and Services       Living arrangements for the past 2 months: Apartment Expected Discharge Date: 03/14/24                                     Social Determinants of Health (SDOH) Interventions SDOH Screenings   Food Insecurity: Food Insecurity Present (03/06/2024)  Housing: High Risk (03/06/2024)  Transportation Needs: No Transportation Needs (03/06/2024)  Utilities: Patient Declined (03/06/2024)  Alcohol Screen: Medium Risk (06/24/2019)  Depression (PHQ2-9): Low Risk  (02/18/2024)  Recent Concern: Depression (PHQ2-9) - Medium Risk (01/21/2024)  Financial Resource Strain: High Risk (07/24/2021)   Received from [facility]  Physical Activity: Inactive (02/18/2024)  Social Connections: Unknown (04/05/2022)   Received from [facility], [facility]  Stress: Stress Concern Present (07/24/2021)   Received from [facility], [facility]  Tobacco Use: High Risk (03/04/2024)  Health Literacy: Adequate Health Literacy (02/18/2024)    Readmission Risk Interventions    12/21/2023    9:25 AM 12/09/2022    9:02 AM 12/01/2022    3:56 PM  Readmission Risk Prevention Plan  Transportation  Screening Complete Complete Complete  HRI or Home Care Consult Complete    Social Work Consult for Recovery Care Planning/Counseling Complete    Palliative Care Screening Not Applicable    Medication Review Oceanographer) Complete Complete Complete  PCP or Specialist appointment within 3-5 days of discharge  Complete Complete  HRI or Home Care Consult  Complete Complete  SW Recovery Care/Counseling Consult  Complete Complete  Palliative Care Screening  Not Applicable Not Applicable  Skilled Nursing Facility  Not Applicable Not Applicable  SNF Comments   n/a

## 2024-04-11 NOTE — Progress Notes (Signed)
 [name]  ZOX:[id number] DOB: 24-Nov-1966 DOA: 03/04/2024 PCP: Abraham Abo, MD    Brief Narrative:  58 year old with a history of cardiomyopathy and cocaine abuse who presented to the ER 03/04/2024 with slurred speech, confusion, and right-sided weakness.  CT head noted a left MCA infarct with CTa of the head and neck revealing an M2 occlusion on the left.  Echocardiogram revealed a large mobile left ventricular thrombus with left atrial thrombus as well as right ventricular apical thrombus and an EF of <20%.  PT/OT have recommended SNF placement on discharge.  The patient has been found to lack medical decision-making capacity such that he is not able to determine his discharge disposition nor is he allowed to sign out AMA.  Goals of Care:   Code Status: Full Code   DVT prophylaxis:  apixaban  (ELIQUIS ) tablet 5 mg   Interim Hx: No acute events recorded overnight.  Afebrile.  The patient expresses great frustration with being asked to take Senokot today.  I have spent a significant amount of time explaining to him the need to be on a bowel regimen while taking narcotics.  He is unable to tell me when he last had a bowel movement.  I have explained to him that we cannot give him narcotics if he is not willing to take Senokot, or another agent as a bowel regimen, as it could lead to severe constipation and even more serious gastrointestinal complications.  He appears comfortable at time of visit.  He has no physical complaints.  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 -Has undergone extensive workup with CT revealing left MCA territory infarct affecting the insula, caudate nucleus, lentiform nucleus, and internal capsule - MRI confirmed these infarcts and also revealed evidence of multiple old cerebellar infarcts as well as findings of chronic small vessel ischemia -TTE noted a large mobile clot in the LV attached to the intraventricular septum just under the  mitral valve as well as severely decreased global LV function with an EF of 20% as well as a small RV apical clot and severely reduced RV systolic function as well as a large and mobile clot in the left atrium -LDL 71 -A1c 5.8 -Continue Eliquis  for intracardiac thrombus - patient has been counseled on multiple occasions on need for very consistent Eliquis  compliance to avoid recurrent stroke and even possible death  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 Continue Eliquis  - no gross volume overload at present - continue Lasix , losartan , Coreg , Aldactone , and Crestor  - will need outpatient Cardiology follow-up  Schizoaffective disorder bipolar type -agitation -lack of capacity Psychiatry has assisted in the care of this patient during his hospitalization - if patient attempts to leave the hospital he should be placed under involuntary commitment -TOC has contacted APS to pursue guardianship as patient lacks decision-making capacity - Psychiatry is presently recommending we await APS decision on guardianship prior to attempting to discharge him  HTN Continue to monitor as patient will allow  HLD Continue Crestor   DM2 Well-controlled with A1c 5.8 - CBG well-controlled - does not appear to require medical therapy as an outpatient  Polysubstance abuse UDS positive for cocaine and benzodiazepines - has admitted to habitual use of crack cocaine  Deconditioning Ambulatory status complicated by previous right AKA and left BKA -patient has no insurance coverage for SNF  Family Communication: No family present at time of exam, and TOC reports he does not have any family contacts Disposition: Difficult disposition  due to incompetence -awaiting guardianship decision via APS   Objective: Blood pressure (!) 154/114, pulse 80, temperature 97.8 F (36.6 C), temperature source Axillary, resp. rate 20, height 5\' 9"  (1.753 m), weight 61.7 kg, SpO2  93%.  Intake/Output Summary (Last 24 hours) at 04/11/2024 0828 Last data filed at 04/11/2024 0020 Gross per 24 hour  Intake 600 ml  Output 1800 ml  Net -1200 ml    Filed Weights   03/26/24 0431 03/28/24 0456 04/04/24 0500  Weight: 42.7 kg 47.5 kg 61.7 kg    Examination: General: No acute respiratory distress -frustrated Lungs: Clear to auscultation bilaterally -no wheezing or crackles Cardiovascular: RRR without murmur Abdomen: Nontender, nondistended, soft, bowel sounds positive Extremities: No signif edema lower extremities  CBC: Recent Labs  Lab 04/10/24 0729  WBC 5.9  HGB 15.6  HCT 46.8  MCV 91.9  PLT 196   Basic Metabolic Panel: Recent Labs  Lab 04/10/24 0729  NA 139  K 4.6  CL 104  CO2 25  GLUCOSE 83  BUN 21*  CREATININE 1.03  CALCIUM  9.3   GFR: Estimated Creatinine Clearance: 70.7 mL/min (by C-G formula based on SCr of 1.03 mg/dL).   Scheduled Meds:  apixaban   5 mg Oral BID   carvedilol   6.25 mg Oral BID WC   furosemide   40 mg Oral Daily   furosemide   40 mg Oral Once   gabapentin   200 mg Oral BID   losartan   50 mg Oral Daily   revefenacin   175 mcg Nebulization Daily   rosuvastatin   5 mg Oral QHS   senna-docusate  1 tablet Oral BID   spironolactone   12.5 mg Oral Daily      LOS: 38 days   Abbe Abate, MD [facility] Office  [phone] Pager - Text Page per Tilford Foley  If 7PM-7AM, please contact night-coverage per Amion 04/11/2024, 8:28 AM

## 2024-04-11 NOTE — Progress Notes (Signed)
 Physical Therapy Treatment Patient Details Name: [name] MRN: [id number] DOB: 24-Jul-1966 Today's Date: 04/11/2024   History of Present Illness Patient is 58 y.o. male presented to ED on 03/04/24 with stroke like symptoms when caregiver found him with slurred speech and right sided weakness. Head CT reveals Acute left MCA territory infarct affecting the insula, caudate nucleus, lentiform nucleus and internal capsule. PMH significant of  Schizoaffective disorder, heart failure with reduced ejection fraction, pretension, DMII, COPD, s/p bilateral amputations, chronic pain, and polysubstance abuse.    PT Comments  Continuing work on functional mobility, activity tolerance, and functional independence; Adam sat up to greet this PT upon arrival, and seemed like he wanted to talk about what's bothering him; session focused on helping to discern overall functional goals that are meaningful for [place]; He voiced frustration re: his medications, and doesn't seem to pull together the "why" of getting the stool softener (which he doesn't want) with his pain meds (which he wants); he also highlighted the progress he has made with moving and using his right upper extremity, and voiced frustration regarding his difficulty with word finding; at one point, saying something similar to "I wish someone could just hit me in the back of my head and that would help me talk better" -- (like in cartoons);   I offered to Travaughn that with hard work, like school work, he can improve his speech; he does need to work with a Warehouse manager -- the experts on this; Czeslaw agreed to try again with Speech Therapy, and maybe we would have better participation if this PT joins in for a co-session the first few sessions as he builds rapport; Will discuss with ST colleagues and look into getting a re-order for Speech Language eval and interventions;   I'm getting the feeling that Or needs to feel heard; to speak with  someone whose only agenda is to understand where Kainin is coming from -- which is difficult for PT, OT, ST, SW, TOC, MD, etc, given time constraints and productivity pressure here in the Acute Care setting; I'm curious about any options for mental health therapy here acutely; I know Zacari has a faith background based on past conversation -- I will plan to reach out to Spiritual Care over the next few days;     If plan is discharge home, recommend the following: A little help with walking and/or transfers;A little help with bathing/dressing/bathroom;Assistance with cooking/housework;Assist for transportation;Supervision due to cognitive status   Can travel by private vehicle        Equipment Recommendations  None recommended by PT    Recommendations for Other Services       Precautions / Restrictions Precautions Precautions: Fall Recall of Precautions/Restrictions: Impaired Precaution/Restrictions Comments: h/o BLE amputations Restrictions Weight Bearing Restrictions Per Provider Order: No     Mobility  Bed Mobility Overal bed mobility: Modified Independent                  Transfers                        Ambulation/Gait                   Stairs             Wheelchair Mobility     Tilt Bed    Modified Rankin (Stroke Patients Only) Modified Rankin (Stroke Patients Only) Pre-Morbid Rankin Score: Slight disability Modified Rankin: Moderately severe disability  Balance     Sitting balance-Leahy Scale: Good Sitting balance - Comments: sitting upright in bed without the need for UE support                                    Communication Communication Communication: Impaired Factors Affecting Communication: Difficulty expressing self;Reduced clarity of speech  Cognition Arousal: Alert Behavior During Therapy: WFL for tasks assessed/performed   PT - Cognitive impairments: Difficult to assess Difficult to assess  due to: Impaired communication                     PT - Cognition Comments: 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 Following commands: Intact      Cueing Cueing Techniques: Verbal cues, Tactile cues, Gestural cues  Exercises Other Exercises Other Exercises: Introduced teh idea of practicing with a basketball hoop that can be attached to  foot of bed, or rails near light at [facility] to work on UE coordination; Srinivas is open to considering    General Comments General comments (skin integrity, edema, etc.): Has a bag in his closet with many contact numbers on many different types of papers      Pertinent Vitals/Pain Pain Assessment Pain Assessment: No/denies pain Pain Intervention(s): Monitored during session    Home Living                          Prior Function            PT Goals (current goals can now be found in the care plan section) Acute Rehab PT Goals Patient Stated Goal: would like to be able to get home PT Goal Formulation: Patient unable to participate in goal setting Time For Goal Achievement: 04/20/24 Potential to Achieve Goals: Fair Progress towards PT goals: Progressing toward goals    Frequency    Min 2X/week      PT Plan      Co-evaluation              AM-PAC PT "6 Clicks" Mobility   Outcome Measure  Help needed turning from your back to your side while in a flat bed without using bedrails?: None Help needed moving from lying on your back to sitting on the side of a flat bed without using bedrails?: None Help needed moving to and from a bed to a chair (including a wheelchair)?: A Little Help needed standing up from a chair using your arms (e.g., wheelchair or bedside chair)?: Total Help needed to walk in hospital room?: Total Help needed climbing 3-5 steps with a railing? : Total 6 Click Score: 14    End of Session   Activity Tolerance: Patient tolerated treatment well Patient left: in bed;with call bell/phone within reach;with bed alarm  set Nurse Communication: Mobility status;Other (comment) (and some ideas for engagement) PT Visit Diagnosis: Other abnormalities of gait and mobility (R26.89);Muscle weakness (generalized) (M62.81);Other symptoms and signs involving the nervous system (R29.898);Hemiplegia and hemiparesis Hemiplegia - Right/Left: Right Hemiplegia - dominant/non-dominant: Dominant Hemiplegia - caused by: Cerebral infarction     Time: 1330-1400 PT Time Calculation (min) (ACUTE ONLY): 30 min  Charges:    $Therapeutic Activity: 8-22 mins $Self Care/Home Management: 8-22 PT General Charges $$ ACUTE PT VISIT: 1 Visit                     Darcus Eastern, PT  Acute Rehabilitation Services  Office [phone] Secure Chat welcomed    Marcial Setting 04/11/2024, 4:44 PM

## 2024-04-12 DIAGNOSIS — I639 Cerebral infarction, unspecified: Secondary | ICD-10-CM | POA: Diagnosis not present

## 2024-04-12 NOTE — Evaluation (Signed)
 Speech Language Pathology Evaluation Patient Details Name: [name] MRN: [id number] DOB: Mar 02, 1966 Today's Date: 04/12/2024 Time: 1191-4782 SLP Time Calculation (min) (ACUTE ONLY): 48 min  Problem List:  Patient Active Problem List   Diagnosis Date Noted   Acute CVA (cerebrovascular accident) (HCC) 03/04/2024   Chronic systolic CHF (congestive heart failure) (HCC) 03/04/2024   Benign essential HTN 03/04/2024   LV (left ventricular) mural thrombus 03/04/2024   Elevated liver enzymes 03/04/2024   Dilated cardiomyopathy (HCC) 03/04/2024   Heart failure with reduced ejection fraction (HCC) 01/02/2024   Acute on chronic HFrEF (heart failure with reduced ejection fraction) (HCC) 12/20/2023   Cocaine use 10/23/2023   COPD (chronic obstructive pulmonary disease) (HCC) 11/25/2022   Pulmonary emboli (HCC) 08/17/2022   Venous ulcer (HCC) 07/23/2022   Sinus bradycardia 07/23/2022   Anemia of chronic disease 07/23/2022   Status post bilateral BKA. 07/23/2022   Acute on chronic systolic congestive heart failure (HCC) 07/12/2022   History of DVT (deep vein thrombosis) 07/12/2022   CAP (community acquired pneumonia) 07/12/2022   Chronic venous hypertension (idiopathic) with ulcer and inflammation of bilateral lower extremity (HCC)    Normocytic anemia 04/04/2022   Acute deep vein thrombosis (DVT) of brachial vein (HCC) 03/31/2022   Swelling of lower extremity 03/23/2022   SIRS (systemic inflammatory response syndrome) (HCC) 03/23/2022   Leg swelling 03/23/2022   Acute on chronic systolic heart failure (HCC) 02/26/2022   Venous stasis ulcer of right ankle limited to breakdown of skin with varicose veins (HCC)    Hypertensive urgency 01/29/2022   Prolonged QT interval 01/29/2022   Alcohol abuse 01/29/2022   chronic ulcers 01/29/2022   Wound infection 12/04/2021   Mixed diabetic hyperlipidemia associated with type 2 diabetes mellitus (HCC) 12/03/2021   Cellulitis of right lower extremity  11/13/2021   Type 2 diabetes mellitus (HCC) 11/13/2021   Antisocial personality disorder (HCC) 11/04/2021   Malingering 11/04/2021   Foot pain 04/23/2021   Chronic combined systolic and diastolic congestive heart failure (HCC) 03/02/2021   History of stroke 03/02/2021   Polysubstance abuse (HCC) 03/02/2021   Skin ulcer of right great toe with necrosis of bone (HCC)    Encounter for assessment of healthcare decision-making capacity    Pain, dental    Dystonia    Dysarthria    Shock liver 01/04/2021   Acute metabolic encephalopathy 01/04/2021   Coronary artery disease involving native coronary artery of native heart without angina pectoris 01/04/2021   Essential hypertension 01/04/2021   Bipolar I disorder (HCC) 01/04/2021   Cocaine abuse (HCC) 01/04/2021   Homelessness 01/04/2021   HLD (hyperlipidemia) 01/04/2021   Osteomyelitis of great toe (HCC)    Ulcer of great toe, left, with necrosis of bone (HCC)    Ulcer of great toe, right, with necrosis of muscle (HCC)    Acute osteomyelitis of toe, left (HCC)    Acute on chronic combined systolic and diastolic CHF (congestive heart failure) (HCC) 12/20/2020   Acute respiratory failure with hypoxia (HCC)    Ischemic stroke (HCC) 12/17/2020   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   Diabetic foot ulcer (HCC) 12/08/2020   CHF (congestive heart failure) (HCC) 11/14/2020   CHF exacerbation (HCC) 11/13/2020   Hypokalemia    Hyperlipidemia    Acute hypoxemic respiratory failure (HCC) 10/26/2020   COPD with acute exacerbation (HCC) 10/14/2020   Acute on chronic combined systolic and diastolic CHF (congestive heart failure) (HCC) 10/02/2020   Heart failure with reduced ejection fraction and diastolic dysfunction (HCC) 09/07/2020  Near syncope 07/21/2020   Acid reflux 06/03/2020   Cannabis abuse 03/19/2020   Schizoaffective disorder (HCC) 06/24/2019   SOB (shortness of breath)    Cocaine abuse (HCC)    Elevated troponin    Homeless     Suicidal ideation    Bilateral lower extremity pain 04/05/2019   Housing situation unstable 03/28/2019   Bilateral lower extremity edema    Acute exacerbation of CHF (congestive heart failure) (HCC) 03/04/2019   Alcohol withdrawal syndrome, with delirium (HCC) 05/03/2018   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   Mild intermittent asthma without complication 08/28/2017   Asthma 12/26/2016   Severe episode of recurrent major depressive disorder, with psychotic features (HCC) 10/31/2016   Cocaine abuse with cocaine-induced mood disorder (HCC) 09/06/2016   Alcohol use disorder, mild, abuse 07/31/2016   Substance induced mood disorder (HCC) 07/30/2016   Schizoaffective disorder, bipolar type (HCC) 05/31/2016   Cocaine use disorder, severe, dependence (HCC) 05/31/2016   Major depressive disorder, recurrent episode (HCC) 05/30/2016   Suicidal behavior with attempted self-injury (HCC) 05/08/2016   Acetaminophen  overdose 05/06/2016   Past Medical History:  Past Medical History:  Diagnosis Date   Asthma    Bipolar 1 disorder (HCC)    CHF (congestive heart failure) (HCC)    COPD (chronic obstructive pulmonary disease) (HCC)    Depression    Diabetes mellitus without complication (HCC)    GERD (gastroesophageal reflux disease)    Homelessness    Hypertension    QT prolongation    Schizoaffective disorder (HCC)    Past Surgical History:  Past Surgical History:  Procedure Laterality Date   AMPUTATION Left 12/10/2022   Procedure: LEFT BELOW KNEE AMPUTATION;  Surgeon: Timothy Ford, MD;  Location: [facility] OR;  Service: Orthopedics;  Laterality: Left;   AMPUTATION TOE Bilateral 01/02/2021   Procedure: AMPUTATION TOE;  Surgeon: Camilo Cella, DPM;  Location: [facility] OR;  Service: Podiatry;  Laterality: Bilateral;  Ok to leave patient on hospital bed   IR ANGIO INTRA EXTRACRAN SEL COM CAROTID INNOMINATE BILAT MOD SED  12/17/2020   IR ANGIO VERTEBRAL SEL VERTEBRAL BILAT MOD  SED  12/17/2020   MULTIPLE EXTRACTIONS WITH ALVEOLOPLASTY N/A 03/25/2023   Procedure: IRRIGATION AND DEBRIDEMENT, MULTIPLE EXTRACTION WITH ALVEOLOPLASTY X 4 , EXTRACTIONS 4 THROUGH 13, 20 THROUGHT 29;  Surgeon: Joseph Nickel, DMD;  Location: [facility] OR;  Service: Oral Surgery;  Laterality: N/A;   RADIOLOGY WITH ANESTHESIA N/A 12/17/2020   Procedure: IR WITH ANESTHESIA;  Surgeon: Radiologist, Medication, MD;  Location: [facility] OR;  Service: Radiology;  Laterality: N/A;   HPI:  Patient is 58 y.o. male presented to ED on 03/04/24 with stroke like symptoms when caregiver found him with slurred speech and right sided weakness. Head CT reveals Acute left MCA territory infarct affecting the insula, caudate nucleus, lentiform nucleus and internal capsule. Pt initially evaluated by SLP 4/5 but s/o 4/22 due to repeated refusals. SLP reordered 5/13 for repeat cognitive-linguistic eval.   PMH significant of  Schizoaffective disorder, heart failure with reduced ejection fraction, pretension, DMII, COPD, s/p bilateral amputations, chronic pain, and polysubstance abuse.   Assessment / Plan / Recommendation Clinical Impression  Pt was seen for reassessment of cognition and communication, seen with PT to establish rapport and evaluate skills during functional mobility tasks. Pt's level of alertness and sustained attention have improved since initial SLP eval earlier this admission. He seems to have reduced carryover and reasoning pertaining to medications, with MD note reporting that he educated  pt on rationale for stool softener, but pt indicating that he has not been told why he has to take it.   Pt follows simple commands well throughout mobility tasks, having difficulty with expressive more than receptive language. Intermittent phonemic more than semantic paraphasias were noted, as well as word-finding at the conversational level. Attempted to offer strategies for this, although pt did not try to incorporate them today.  Word-finding errors were more prominent at the end of the session when pt had become more frustrated or at least not as engaged. Pt also has dysarthria, marked primarily by impaired articulation, which also impacts functional communication. Education initiated with pt about SLP services and at this time, he is agreeable to trial of speech therapy.      SLP Assessment  SLP Recommendation/Assessment: Patient needs continued Speech Lanaguage Pathology Services SLP Visit Diagnosis: Dysarthria and anarthria (R47.1);Aphasia (R47.01)    Recommendations for follow up therapy are one component of a multi-disciplinary discharge planning process, led by the attending physician.  Recommendations may be updated based on patient status, additional functional criteria and insurance authorization.    Follow Up Recommendations  Home health SLP    Assistance Recommended at Discharge  Frequent or constant Supervision/Assistance  Functional Status Assessment Patient has had a recent decline in their functional status and demonstrates the ability to make significant improvements in function in a reasonable and predictable amount of time.  Frequency and Duration min 2x/week  2 weeks      SLP Evaluation Cognition  Overall Cognitive Status: No family/caregiver present to determine baseline cognitive functioning Arousal/Alertness: Awake/alert Orientation Level: Oriented to person;Oriented to place;Oriented to situation Attention: Sustained Sustained Attention: Appears intact (verbal basic) Memory: Impaired Memory Impairment: Decreased recall of new information Behaviors: Poor frustration tolerance       Comprehension  Auditory Comprehension Overall Auditory Comprehension: Appears within functional limits for tasks assessed (following simple commands well)    Expression Expression Primary Mode of Expression: Verbal Verbal Expression Overall Verbal Expression: Impaired Initiation: No  impairment Automatic Speech: Name;Social Response Level of Generative/Spontaneous Verbalization: Conversation Naming: Impairment Verbal Errors: Semantic paraphasias;Phonemic paraphasias;Aware of errors;Other (comment) (word finding in conversation) Interfering Components: Speech intelligibility Non-Verbal Means of Communication: Not applicable   Oral / Motor  Motor Speech Overall Motor Speech: Impaired Respiration: Within functional limits Phonation: Normal Resonance: Within functional limits Articulation: Impaired Level of Impairment: Conversation Intelligibility: Intelligibility reduced Conversation: 75-100% accurate            Beth Brooke., M.A. CCC-SLP Acute Rehabilitation Services Office: [phone]  Secure chat preferred  04/12/2024, 2:19 PM

## 2024-04-12 NOTE — Progress Notes (Signed)
 Pt has refused meds, VS and assessments today.  Attempted meds by two RN's.  He did work with PT and RT gave breathing treatment.  He remains irritable and non-compliant.  MD notified.

## 2024-04-12 NOTE — Progress Notes (Signed)
 [name]  ZOX:[id number] DOB: 06-Dec-1965 DOA: 03/04/2024 PCP: Abraham Abo, MD    Brief Narrative:  58 year old with a history of cardiomyopathy and cocaine abuse who presented to the ER 03/04/2024 with slurred speech, confusion, and right-sided weakness.  CT head noted a left MCA infarct with CTa of the head and neck revealing an M2 occlusion on the left.  Echocardiogram revealed a large mobile left ventricular thrombus with left atrial thrombus as well as right ventricular apical thrombus and an EF of <20%.  PT/OT have recommended SNF placement on discharge.  The patient has been found to lack medical decision-making capacity such that he is not able to determine his discharge disposition nor is he allowed to sign out AMA.  Goals of Care:   Code Status: Full Code   DVT prophylaxis:  apixaban  (ELIQUIS ) tablet 5 mg   Interim Hx: Patient has been refusing vitals checks.  There is no new data to process today as a result.  When I entered the room the patient turned looked at me and then turned his face to the windows.  He appeared to be resting comfortably with no outward evidence of uncontrolled pain or respiratory distress.  He had 2 meal trays at the end of his bed which were consumed at 100%.  I asked the patient how he was feeling and he did not respond.  I asked the patient if his pain was well-controlled and he did not respond.  I asked the patient if I could examine him and he did not respond.  I explained to him that I would not examine him without his permission and he continued to avoid speaking to me.  I asked him if anything was wrong and he would still not speak with me.  He did not appear to be in any distress.  I explained to the patient today would be my last day caring for him as I was going off service, and that one of my partners would take over as his physician tomorrow.  I wished him well and exited the room.  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 -Has undergone extensive workup with CT revealing left MCA territory infarct affecting the insula, caudate nucleus, lentiform nucleus, and internal capsule - MRI confirmed these infarcts and also revealed evidence of multiple old cerebellar infarcts as well as findings of chronic small vessel ischemia -TTE noted a large mobile clot in the LV attached to the intraventricular septum just under the mitral valve as well as severely decreased global LV function with an EF of 20% as well as a small RV apical clot and severely reduced RV systolic function as well as a large and mobile clot in the left atrium -LDL 71 -A1c 5.8 -Continue Eliquis  for intracardiac thrombus - patient has been counseled on multiple occasions on need for very consistent Eliquis  compliance to avoid recurrent stroke and even possible death -though psychiatry has deemed him incompetent he is still very much able to physically resist complying with care and there is no safe way to force medications on him but he refuses to take -as a result the nursing staff and the physician simply make multiple attempts throughout the day to convince the patient of the need to comply with medical therapy  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 Continue Eliquis  - no gross volume overload at present - continue Lasix , losartan , Coreg , Aldactone , and Crestor  - will need outpatient Cardiology follow-up  Schizoaffective disorder bipolar type -agitation -lack of capacity Psychiatry has assisted in the care of this patient during his hospitalization - if patient attempts to leave the hospital he should be placed under involuntary commitment -TOC has contacted APS to pursue guardianship as patient lacks decision-making capacity - Psychiatry is presently recommending we await APS decision on guardianship prior to attempting to discharge him  Pain control The patient frequently voices complains of low back pain  -given his prolonged hospital stay and his lower extremity amputations I do not doubt that he is suffering with frequent bouts of musculoskeletal back pain -he is provided a modest dose of narcotic pain medication on a as needed basis -it has however been explained to him for many days in a row that he must either be having active bowel movements each day or taking his stool softener in order to be a candidate for narcotic pain medication -it has been explained to him on multiple occasions that ongoing use of narcotic pain medications in the absence of a bowel movement could lead to very dangerous and even life-threatening gastrointestinal complications -he has not received this information well (he seems to have some aversion to a bowel regimen) despite multiple attempts to explain it to him - he has made multiple accusations that his nurse and physician are not taking good care of him -I have asked him why he is so sent against using medication to ensure that he has regular bowel movements but he will not answer me  HTN Continue to monitor as patient will allow  HLD Continue Crestor   DM2 Well-controlled with A1c 5.8 - CBG well-controlled - does not appear to require medical therapy as an outpatient  Polysubstance abuse UDS positive for cocaine and benzodiazepines - has admitted to habitual use of crack cocaine  Deconditioning Ambulatory status complicated by previous right AKA and left BKA -patient has no insurance coverage for SNF  Family Communication: No family present at time of exam, and TOC reports he does not have any family contacts Disposition: Difficult disposition due to incompetence -awaiting guardianship decision via APS   Objective: Blood pressure 136/85, pulse 72, temperature 97.6 F (36.4 C), temperature source Axillary, resp. rate 17, height 5\' 9"  (1.753 m), weight 61.7 kg, SpO2 99%.  Intake/Output Summary (Last 24 hours) at 04/12/2024 1006 Last data filed at 04/12/2024  0905 Gross per 24 hour  Intake 720 ml  Output 1425 ml  Net -705 ml    Filed Weights   03/26/24 0431 03/28/24 0456 04/04/24 0500  Weight: 42.7 kg 47.5 kg 61.7 kg    Examination: Unable to physically examine the patient today as he refuses to consent -on visual observation he is alert and in no acute distress with no outward evidence of uncontrolled pain  CBC: Recent Labs  Lab 04/10/24 0729  WBC 5.9  HGB 15.6  HCT 46.8  MCV 91.9  PLT 196   Basic Metabolic Panel: Recent Labs  Lab 04/10/24 0729  NA 139  K 4.6  CL 104  CO2 25  GLUCOSE 83  BUN 21*  CREATININE 1.03  CALCIUM  9.3   GFR: Estimated Creatinine Clearance: 70.7 mL/min (by C-G formula based on SCr of 1.03 mg/dL).   Scheduled Meds:  apixaban   5 mg Oral BID   carvedilol   6.25 mg Oral BID WC   furosemide   40 mg Oral Daily   gabapentin   200 mg Oral BID   losartan   50 mg Oral Daily   revefenacin   175 mcg  Nebulization Daily   rosuvastatin   5 mg Oral QHS   senna-docusate  1 tablet Oral BID   spironolactone   12.5 mg Oral Daily      LOS: 39 days   Abbe Abate, MD [facility] Office  [phone] Pager - Text Page per Amion  If 7PM-7AM, please contact night-coverage per Amion 04/12/2024, 10:06 AM

## 2024-04-12 NOTE — Progress Notes (Signed)
 Physical Therapy Treatment Patient Details Name: [name] MRN: [id number] DOB: Jan 01, 1966 Today's Date: 04/12/2024   History of Present Illness Patient is 58 y.o. male presented to ED on 03/04/24 with stroke like symptoms when caregiver found him with slurred speech and right sided weakness. Head CT reveals Acute left MCA territory infarct affecting the insula, caudate nucleus, lentiform nucleus and internal capsule. PMH significant of  Schizoaffective disorder, heart failure with reduced ejection fraction, pretension, DMII, COPD, s/p bilateral amputations, chronic pain, and polysubstance abuse.    PT Comments  Continuing work on functional mobility and activity tolerance;  session focused on functional transfers, wheelchair mobility, and re-introducing Speech Therapy for work on his speech and cognition; In yesterday's session, we discussed possibilities for working on speech, and [name] was open to meeting with a Speech Therapist today;   [name] transferred back to bed in a way that surprised this PT today, moving through a semi-tall kneel position, upright on flexed L knee and R residual limb (it's a R knee disarticulation) while holding on to bedrails; a different movement pattern, but safe with supervision and he appropriately asked for assist to raise bedrail; will continue efforts      If plan is discharge home, recommend the following: A little help with walking and/or transfers;A little help with bathing/dressing/bathroom;Assistance with cooking/housework;Assist for transportation;Supervision due to cognitive status   Can travel by private vehicle        Equipment Recommendations  None recommended by PT    Recommendations for Other Services       Precautions / Restrictions Precautions Precautions: Fall Recall of Precautions/Restrictions: Impaired Precaution/Restrictions Comments: h/o BLE amputations Restrictions Weight Bearing Restrictions Per Provider Order: No      Mobility  Bed Mobility Overal bed mobility: Modified Independent                  Transfers     Transfers: Bed to chair/wheelchair/BSC         Anterior-Posterior transfers: Supervision   General transfer comment: Supervision and verbal cues for safety with locking brakes and setup of w/c. Supervision to return to bed; he placed residual limbs on the bed, did initial chair push up with forward motion back to bed, then reached forward to foot of bed and far bed rail and pulled forward, resembling tall kneel up right on flexed L knee and R residual limb (it's a R knee disarticulation) while holding on to bedrails; at that point he requested the bedrail adjacent to the wc be lifted back up, and he moved his L hand from foot of bed to the closer rail and eased himself back to sitting on the bed    Ambulation/Gait                   Psychologist, counselling mobility: Yes Wheelchair propulsion: Both upper extremities Wheelchair parts:  (Modified independent) Distance: 150 Wheelchair Assistance Details (indicate cue type and reason): Less collision with objects today; RUE with smaller arc of forward push than L   Tilt Bed    Modified Rankin (Stroke Patients Only) Modified Rankin (Stroke Patients Only) Pre-Morbid Rankin Score: Slight disability Modified Rankin: Moderately severe disability     Balance   Sitting-balance support: Bilateral upper extremity supported Sitting balance-Leahy Scale: Good Sitting balance - Comments: sitting upright in bed without the need for UE support  Communication Communication Communication: Impaired Factors Affecting Communication: Difficulty expressing self;Reduced clarity of speech  Cognition Arousal: Alert Behavior During Therapy: WFL for tasks assessed/performed   PT - Cognitive impairments: Difficult to assess Difficult  to assess due to: Impaired communication                     PT - Cognition Comments: 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 Following commands: Intact      Cueing Cueing Techniques: Verbal cues, Tactile cues, Gestural cues  Exercises      General Comments General comments (skin integrity, edema, etc.): See also ST note of this date for other details related to speech and cognition      Pertinent Vitals/Pain      Home Living                          Prior Function            PT Goals (current goals can now be found in the care plan section) Acute Rehab PT Goals Patient Stated Goal: would like to be able to get home PT Goal Formulation: Patient unable to participate in goal setting Time For Goal Achievement: 04/20/24 Potential to Achieve Goals: Fair Progress towards PT goals: Progressing toward goals    Frequency    Min 2X/week      PT Plan      Co-evaluation PT/OT/SLP Co-Evaluation/Treatment: Yes Reason for Co-Treatment: Necessary to address cognition/behavior during functional activity PT goals addressed during session: Mobility/safety with mobility   SLP goals addressed during session: Cognition;Communication    AM-PAC PT "6 Clicks" Mobility   Outcome Measure  Help needed turning from your back to your side while in a flat bed without using bedrails?: None Help needed moving from lying on your back to sitting on the side of a flat bed without using bedrails?: None Help needed moving to and from a bed to a chair (including a wheelchair)?: A Little Help needed standing up from a chair using your arms (e.g., wheelchair or bedside chair)?: Total Help needed to walk in hospital room?: Total Help needed climbing 3-5 steps with a railing? : Total 6 Click Score: 14    End of Session   Activity Tolerance: Patient tolerated treatment well Patient left: in bed;with call bell/phone within reach;with bed alarm set Nurse Communication: Mobility status;Other (comment)  (and some ideas for engagement) PT Visit Diagnosis: Other abnormalities of gait and mobility (R26.89);Muscle weakness (generalized) (M62.81);Other symptoms and signs involving the nervous system (R29.898);Hemiplegia and hemiparesis Hemiplegia - Right/Left: Right Hemiplegia - dominant/non-dominant: Dominant Hemiplegia - caused by: Cerebral infarction     Time: 1303-1340 PT Time Calculation (min) (ACUTE ONLY): 37 min  Charges:    $Therapeutic Activity: 8-22 mins PT General Charges $$ ACUTE PT VISIT: 1 Visit                     Darcus Eastern, PT  Acute Rehabilitation Services Office [phone] Secure Chat welcomed    Marcial Setting 04/12/2024, 3:20 PM

## 2024-04-13 DIAGNOSIS — E1151 Type 2 diabetes mellitus with diabetic peripheral angiopathy without gangrene: Secondary | ICD-10-CM | POA: Diagnosis not present

## 2024-04-13 DIAGNOSIS — R569 Unspecified convulsions: Secondary | ICD-10-CM

## 2024-04-13 DIAGNOSIS — I10 Essential (primary) hypertension: Secondary | ICD-10-CM | POA: Diagnosis not present

## 2024-04-13 DIAGNOSIS — I639 Cerebral infarction, unspecified: Secondary | ICD-10-CM | POA: Diagnosis not present

## 2024-04-13 DIAGNOSIS — F209 Schizophrenia, unspecified: Secondary | ICD-10-CM

## 2024-04-13 MED ORDER — CARBAMIDE PEROXIDE 6.5 % OT SOLN
5.0000 [drp] | Freq: Two times a day (BID) | OTIC | Status: AC
  Administered 2024-04-13 – 2024-04-16 (×6): 5 [drp] via OTIC
  Filled 2024-04-13 (×2): qty 15

## 2024-04-13 NOTE — Progress Notes (Signed)
 Physical Therapy Treatment Patient Details Name: [name] MRN: [id number] DOB: 03-02-1966 Today's Date: 04/13/2024   History of Present Illness Patient is 58 y.o. male presented to ED on 03/04/24 with stroke like symptoms when caregiver found him with slurred speech and right sided weakness. Head CT reveals Acute left MCA territory infarct affecting the insula, caudate nucleus, lentiform nucleus and internal capsule. PMH significant of  Schizoaffective disorder, heart failure with reduced ejection fraction, pretension, DMII, COPD, s/p bilateral amputations, chronic pain, and polysubstance abuse.    PT Comments  Continuing work on functional mobility and activity tolerance;  session with OT to help discern functional goals, and build rapport with the hopes of better participation with all 3 therapy disciplines, and improvement in functional independence; seems to benefit from getting out of his room to talk more fully about how he is doing; we rolled on the third floor off the unit and decided to check out the chapel, with Tyhir propelling some, and this PT, pushing the wheelchair some;  hi R UE still with less force  compared to L, and less efficient propulsion; Tayon's transfer back to bed was similar to how he performed it last PT session, we will need to revisit this technique, and tweak it to lessen the need for Kabe to ask for assist with bedrails; In the chapel, we saw a SW Davyan know's well from previous admissions, and we engaged in conversation; Rodger needed cues for path finding back to his room from the chapel, and used hallway direction signs with cues;  He seemed hopeful when discussing the visitors he had today; at the end of the PT session, we reviewed the plan for speech therapy tomorrow and for PT and OT to come back to see him at the beginning of next week; Paola Bohr, Child psychotherapist on three W. reached out to [employer] and OT later via secure chat; we have requested from his ACTT team to get his  wheelchair here to the hospital, and I asked about perhaps getting his prosthesis here as well    If plan is discharge home, recommend the following: A little help with walking and/or transfers;A little help with bathing/dressing/bathroom;Assistance with cooking/housework;Assist for transportation;Supervision due to cognitive status   Can travel by private vehicle        Equipment Recommendations  None recommended by PT    Recommendations for Other Services       Precautions / Restrictions Precautions Precautions: Fall Recall of Precautions/Restrictions: Impaired Precaution/Restrictions Comments: h/o BLE amputations Restrictions Weight Bearing Restrictions Per Provider Order: No     Mobility  Bed Mobility Overal bed mobility: Modified Independent                  Transfers     Transfers: Bed to chair/wheelchair/BSC         Anterior-Posterior transfers: Contact guard assist   General transfer comment: Supervision and verbal cues for safety with locking brakes and setup of w/c. Supervision to return to bed; he placed residual limbs on the bed, did initial chair push up with forward motion back to bed, then reached forward to foot of bed and far bed rail and pulled forward, resembling tall kneel up right on flexed L knee and R residual limb (it's a R knee disarticulation) while holding on to bedrails; at that point he requested the bedrail adjacent to the wc be lifted back up, and he moved his L hand from foot of bed to the closer rail and eased himself  back to sitting on the bed    Ambulation/Gait                   Psychologist, counselling mobility: Yes Wheelchair propulsion: Both upper extremities Wheelchair parts:  (midified independent) Distance: 150 Wheelchair Assistance Details (indicate cue type and reason): see PT comments   Tilt Bed    Modified Rankin (Stroke Patients Only) Modified Rankin  (Stroke Patients Only) Pre-Morbid Rankin Score: Slight disability Modified Rankin: Moderately severe disability     Balance     Sitting balance-Leahy Scale: Good Sitting balance - Comments: sitting upright in bed without the need for UE support                                    Communication Communication Communication: Impaired Factors Affecting Communication: Difficulty expressing self;Reduced clarity of speech  Cognition Arousal: Alert Behavior During Therapy: WFL for tasks assessed/performed   PT - Cognitive impairments: Difficult to assess Difficult to assess due to: Impaired communication                     PT - Cognition Comments: 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 Following commands: Intact      Cueing Cueing Techniques: Verbal cues, Tactile cues, Gestural cues  Exercises      General Comments General comments (skin integrity, edema, etc.): See also OT note and TOC notes      Pertinent Vitals/Pain Pain Assessment Pain Assessment: No/denies pain    Home Living                          Prior Function            PT Goals (current goals can now be found in the care plan section) Acute Rehab PT Goals Patient Stated Goal: would like to be able to get home PT Goal Formulation: Patient unable to participate in goal setting Time For Goal Achievement: 04/20/24 Potential to Achieve Goals: Fair Progress towards PT goals: Progressing toward goals    Frequency    Min 2X/week      PT Plan      Co-evaluation PT/OT/SLP Co-Evaluation/Treatment: Yes Reason for Co-Treatment: Necessary to address cognition/behavior during functional activity PT goals addressed during session: Mobility/safety with mobility        AM-PAC PT "6 Clicks" Mobility   Outcome Measure  Help needed turning from your back to your side while in a flat bed without using bedrails?: None Help needed moving from lying on your back to sitting on the side of a flat bed  without using bedrails?: None Help needed moving to and from a bed to a chair (including a wheelchair)?: A Little Help needed standing up from a chair using your arms (e.g., wheelchair or bedside chair)?: Total Help needed to walk in hospital room?: Total Help needed climbing 3-5 steps with a railing? : Total 6 Click Score: 14    End of Session   Activity Tolerance: Patient tolerated treatment well Patient left: in bed;with call bell/phone within reach;with bed alarm set Nurse Communication: Mobility status;Other (comment) (and some ideas for engagement) PT Visit Diagnosis: Other abnormalities of gait and mobility (R26.89);Muscle weakness (generalized) (M62.81);Other symptoms and signs involving the nervous system (R29.898);Hemiplegia and hemiparesis Hemiplegia - Right/Left: Right Hemiplegia - dominant/non-dominant: Dominant Hemiplegia -  caused by: Cerebral infarction     Time: 1330-1400 PT Time Calculation (min) (ACUTE ONLY): 30 min  Charges:    $Therapeutic Activity: 8-22 mins PT General Charges $$ ACUTE PT VISIT: 1 Visit                     Darcus Eastern, PT  Acute Rehabilitation Services Office [phone] Secure Chat welcomed    Marcial Setting 04/13/2024, 5:16 PM

## 2024-04-13 NOTE — Progress Notes (Signed)
 Occupational Therapy Treatment Patient Details Name: [name] MRN: [id number] DOB: Apr 17, 1966 Today's Date: 04/13/2024   History of present illness Patient is 58 y.o. male presented to ED on 03/04/24 with stroke like symptoms when caregiver found him with slurred speech and right sided weakness. Head CT reveals Acute left MCA territory infarct affecting the insula, caudate nucleus, lentiform nucleus and internal capsule. PMH significant of  Schizoaffective disorder, heart failure with reduced ejection fraction, pretension, DMII, COPD, s/p bilateral amputations, chronic pain, and polysubstance abuse.   OT comments  Session focused on rapport building and discussion with pt on functional goals. Pt aware of RUE, cognition, and communication deficits to some extent; predominantly aware of communication difficulty. Pt agreeable to OOB mobility in chair, but not fully agreeable to RUE HEP this session, however, discussed wheelchair mobility being an excellent RUE exercise. Pt able to propel chair in hall, noting mild R drift due to decreased proprioception/shoulder extension/hand placement/strength during propelling of chair as compared to LUE. Pt minimally receptive to feedback but work in progress. Pt fatiguing and ultimately needing assist/rest break while propelling to chapel. Pt with self-initiated conversation regarding OT at end of session, and reports he may be willing to try RUE HEP on Monday. Needing multiple cues to recall OT return Monday as well as when other disciplines would return. Pt slowed communication to learn more about this OT and agreeable to continue working with this OT. Will continue to follow acutely.       If plan is discharge home, recommend the following:  A little help with walking and/or transfers;A little help with bathing/dressing/bathroom;Supervision due to cognitive status   Equipment Recommendations  None recommended by OT    Recommendations for Other Services       Precautions / Restrictions Precautions Precautions: Fall Recall of Precautions/Restrictions: Impaired Precaution/Restrictions Comments: h/o BLE amputations Restrictions Weight Bearing Restrictions Per Provider Order: No       Mobility Bed Mobility Overal bed mobility: Modified Independent                  Transfers     Transfers: Bed to chair/wheelchair/BSC         Anterior-Posterior transfers: Contact guard assist   General transfer comment: Supervision and verbal cues for safety with locking brakes and setup of w/c. Supervision to return to bed; he placed residual limbs on the bed, did initial chair push up with forward motion back to bed, then reached forward to foot of bed and far bed rail and pulled forward, resembling tall kneel up right on flexed L knee and R residual limb (it's a R knee disarticulation) while holding on to bedrails; at that point he requested the bedrail adjacent to the wc be lifted back up, and he moved his L hand from foot of bed to the closer rail and eased himself back to sitting on the bed     Balance     Sitting balance-Leahy Scale: Good Sitting balance - Comments: sitting upright in bed without the need for UE support                                   ADL either performed or assessed with clinical judgement   ADL Overall ADL's : Needs assistance/impaired                         Toilet Transfer: Contact guard assist;Anterior/posterior  Functional mobility during ADLs: Supervision/safety;Wheelchair (increased time, does slightly veer R due to decr RUE strength and proprioception (does not bring RUE as far back on wheel on chair to get same leverage as L))      Extremity/Trunk Assessment Upper Extremity Assessment Upper Extremity Assessment: Right hand dominant RUE Deficits / Details: decreased proprioception, neuromuscular endurance, and strength noted when attempting to propel wheelchair.  pt deferred formalized strength testing but did demo ROM of shoulder WFL RUE Coordination: decreased fine motor;decreased gross motor   Lower Extremity Assessment Lower Extremity Assessment: Defer to PT evaluation        Vision   Vision Assessment?: Vision impaired- to be further tested in functional context Additional Comments: unable to read signage on wall to direct which way room numbers are; decr acuity. Decr visual attention to R as pt with very slight bump into wall and sink; could also likely be due to propelling in chair with BUE and RUE weaker as comapred to L. makes good eye contact with therapist on R   Perception     Praxis     Communication Communication Communication: Impaired Factors Affecting Communication: Difficulty expressing self;Reduced clarity of speech   Cognition Arousal: Alert Behavior During Therapy: WFL for tasks assessed/performed Cognition: Cognition impaired     Awareness: Online awareness impaired, Intellectual awareness intact Memory impairment (select all impairments): Short-term memory, Working memory Attention impairment (select first level of impairment): Selective attention   OT - Cognition Comments: Pt asking multiple times for repeat of when OT/PT/SLP would return so he could feel prepared with poor STM noted.                 Following commands: Intact        Cueing   Cueing Techniques: Verbal cues, Tactile cues, Gestural cues  Exercises Exercises: Other exercises Other Exercises Other Exercises: Pt deferred RUE exercise but was agreable to stretching which allowed OT to see more ROM, noting pt rests with hand in slightly flexed/supinated position with wrist flexed Other Exercises: worked on Patent examiner with pt asking about therapist personal life, pt slowed down his communication to articulate his words optimizing communication between himself and therapist    Shoulder Instructions       General Comments  See also OT note and TOC notes    Pertinent Vitals/ Pain       Pain Assessment Pain Assessment: No/denies pain  Home Living                                          Prior Functioning/Environment              Frequency  Min 1X/week        Progress Toward Goals  OT Goals(current goals can now be found in the care plan section)  Progress towards OT goals: Progressing toward goals  Acute Rehab OT Goals Patient Stated Goal: get better OT Goal Formulation: With patient Time For Goal Achievement: 04/27/24 Potential to Achieve Goals: Fair ADL Goals Pt/caregiver will Perform Home Exercise Program: Right Upper extremity;Increased strength  Plan      Co-evaluation    PT/OT/SLP Co-Evaluation/Treatment: Yes Reason for Co-Treatment: Necessary to address cognition/behavior during functional activity PT goals addressed during session: Mobility/safety with mobility OT goals addressed during session: Strengthening/ROM      AM-PAC OT "6 Clicks" Daily Activity     Outcome Measure  Help from another person eating meals?: None Help from another person taking care of personal grooming?: A Little Help from another person toileting, which includes using toliet, bedpan, or urinal?: A Lot Help from another person bathing (including washing, rinsing, drying)?: A Little Help from another person to put on and taking off regular upper body clothing?: A Little Help from another person to put on and taking off regular lower body clothing?: A Little 6 Click Score: 18    End of Session Equipment Utilized During Treatment: Other (comment) (wheelchair)  OT Visit Diagnosis: Unsteadiness on feet (R26.81);Other abnormalities of gait and mobility (R26.89);Muscle weakness (generalized) (M62.81);Other symptoms and signs involving the nervous system (R29.898);Other symptoms and signs involving cognitive function;Hemiplegia and hemiparesis Hemiplegia - Right/Left: Right Hemiplegia  - dominant/non-dominant: Dominant Hemiplegia - caused by: Cerebral infarction   Activity Tolerance Patient tolerated treatment well   Patient Left in bed;with call bell/phone within reach;with bed alarm set   Nurse Communication Mobility status        Time: 1320-1400 OT Time Calculation (min): 40 min  Charges: OT General Charges $OT Visit: 1 Visit OT Treatments $Therapeutic Activity: 23-37 mins  Karilyn Ouch, OTR/L [facility] Acute Rehabilitation Office: [phone]   [name] 04/13/2024, 5:29 PM

## 2024-04-13 NOTE — TOC Progression Note (Signed)
 Transition of Care [facility]) - Progression Note    Patient Details  Name: [name] MRN: [id number] Date of Birth: 11-17-1966  Transition of Care [facility]) CM/SW Contact  Tandy Fam, [name] Phone Number: 04/13/2024, 2:58 PM  Clinical Narrative:   CSW received call back from Josefina Nian with [employer] asking for additional collateral information, wanting information on patient's payee and ACTT team. CSW spoke with Sherrlyn Dolores with patient's ACTT team [phone]). Patient's payee is with [place] [phone]). Sherrlyn Dolores asked if the patient's wheelchair had been brought up as she couldn't remember if someone had done that. Sherrlyn Dolores also in agreement to speak with [employer] for collateral information. CSW checked and it doesn't appear that patient's wheelchair has been brought up for him. PT also asking if ACTT team can bring patient's prosthesis to assist in mobility. CSW contacted Sherrlyn Dolores back to ask about both wheelchair and prosthesis, she will contact home health aide to see if she has a key to get into his apartment to obtain items and bring them up for the patient. CSW attempted to reach Josefina Nian to provide contact information for payee and ACTT team, and left a voicemail. CSW to follow.    Expected Discharge Plan: Skilled Nursing Facility Barriers to Discharge: Continued Medical Work up, English as a second language teacher, Active Substance Use - Placement, Inadequate or no insurance, SNF Pending payor source - LOG, SNF Pending Medicaid, SNF Pending bed offer  Expected Discharge Plan and Services       Living arrangements for the past 2 months: Apartment Expected Discharge Date: 03/14/24                                     Social Determinants of Health (SDOH) Interventions SDOH Screenings   Food Insecurity: Food Insecurity Present (03/06/2024)  Housing: High Risk (03/06/2024)  Transportation Needs: No Transportation Needs (03/06/2024)  Utilities: Patient Declined (03/06/2024)  Alcohol Screen: Medium Risk  (06/24/2019)  Depression (PHQ2-9): Low Risk  (02/18/2024)  Recent Concern: Depression (PHQ2-9) - Medium Risk (01/21/2024)  Financial Resource Strain: High Risk (07/24/2021)   Received from [facility], [facility]  Physical Activity: Inactive (02/18/2024)  Social Connections: Unknown (04/05/2022)   Received from [facility], [facility]  Stress: Stress Concern Present (07/24/2021)   Received from [facility], [facility]  Tobacco Use: High Risk (03/04/2024)  Health Literacy: Adequate Health Literacy (02/18/2024)    Readmission Risk Interventions    12/21/2023    9:25 AM 12/09/2022    9:02 AM 12/01/2022    3:56 PM  Readmission Risk Prevention Plan  Transportation Screening Complete Complete Complete  HRI or Home Care Consult Complete    Social Work Consult for Recovery Care Planning/Counseling Complete    Palliative Care Screening Not Applicable    Medication Review Oceanographer) Complete Complete Complete  PCP or Specialist appointment within 3-5 days of discharge  Complete Complete  HRI or Home Care Consult  Complete Complete  SW Recovery Care/Counseling Consult  Complete Complete  Palliative Care Screening  Not Applicable Not Applicable  Skilled Nursing Facility  Not Applicable Not Applicable  SNF Comments   n/a

## 2024-04-13 NOTE — Progress Notes (Signed)
 [facility]                                                                               [name], is a 58 y.o. male, DOB - 1966-10-28, ZOX:[id number] Admit date - 03/04/2024    Outpatient Primary MD for the patient is Abraham Abo, MD  LOS - 40  days    Brief summary   58 year old with a history of cardiomyopathy and cocaine abuse who presented to the ER 03/04/2024 with slurred speech, confusion, and right-sided weakness. CT head noted a left MCA infarct with CTa of the head and neck revealing an M2 occlusion on the left. Echocardiogram revealed a large mobile left ventricular thrombus with left atrial thrombus as well as right ventricular apical thrombus and an EF of <20%. PT/OT have recommended SNF placement on discharge. The patient has been found to lack medical decision-making capacity such that he is not able to determine his discharge disposition nor is he allowed to sign out AMA.    Assessment & Plan    Assessment and Plan: * Acute CVA (cerebrovascular accident) (HCC) Has undergone extensive workup with CT revealing left MCA territory infarct affecting the insula, caudate nucleus, lentiform nucleus, and internal capsule - MRI confirmed these infarcts and also revealed evidence of multiple old cerebellar infarcts as well as findings of chronic small vessel ischemia -TTE noted a large mobile clot in the LV attached to the intraventricular septum just under the mitral valve as well as severely decreased global LV function with an EF of 20% as well as a small RV apical clot and severely reduced RV systolic function as well as a large and mobile clot in the left atrium -LDL 71 -A1c 5.8 -Continue Eliquis  for intracardiac thrombus - patient has been counseled on multiple occasions on need for very consistent Eliquis  compliance to avoid recurrent stroke and even possible death -though psychiatry has deemed him incompetent he is still very much able to physically resist  complying with care and there is no safe way to force medications on him but he refuses to take -as a result the nursing staff and the physician simply make multiple attempts throughout the day to convince the patient of the need to comply with medical therapy  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 Continue Eliquis  - no gross volume overload at present - continue Lasix , losartan , Coreg , Aldactone , and Crestor  - will need outpatient Cardiology follow-up   Schizoaffective disorder bipolar type -agitation -lack of capacity Psychiatry has assisted in the care of this patient during his hospitalization - if patient attempts to leave the hospital he should be placed under involuntary commitment -TOC has contacted APS to pursue guardianship as patient lacks decision-making capacity - Psychiatry is presently recommending we await APS decision on guardianship prior to attempting to discharge him   Pain control The patient frequently voices complains of low back pain -given his prolonged hospital stay and his lower extremity amputations I do not doubt that he is suffering with frequent bouts of musculoskeletal back pain -he is provided a modest dose of narcotic pain medication on a as needed basis -it has however been explained to  him for many days in a row that he must either be having active bowel movements each day or taking his stool softener in order to be a candidate for narcotic pain medication -it has been explained to him on multiple occasions that ongoing use of narcotic pain medications in the absence of a bowel movement could lead to very dangerous and even life-threatening gastrointestinal complications -he has not received this information well (he seems to have some aversion to a bowel regimen) despite multiple attempts to explain it to him - he has made multiple accusations that his nurse and physician are not taking good care of him -I have asked him why he is  so sent against using medication to ensure that he has regular bowel movements but he will not answer me   HTN Well controlled.     HLD Continue Crestor    DM2 CBG (last 3)  No results for input(s): "GLUCAP" in the last 72 hours. Well-controlled with A1c 5.8 - CBG well-controlled - does not appear to require medical therapy as an outpatient   Polysubstance abuse UDS positive for cocaine and benzodiazepines - has admitted to habitual use of crack cocaine   Deconditioning Ambulatory status complicated by previous right AKA and left BKA -patient has no insurance coverage for SNF   COPD (chronic obstructive pulmonary disease) (HCC) No s/sx of exacerbation Continue trelegy and SABA prn      History of DVT (deep vein thrombosis) Resume eliquis .    Status post bilateral BKA. Chronic pain -continue the use of gabapentin , oxycodone  and muscle relaxants when can safely swallow PRN IV morphine  for now      Estimated body mass index is 20.09 kg/m as calculated from the following:   Height as of this encounter: 5\' 9"  (1.753 m).   Weight as of this encounter: 61.7 kg.  Code Status:  full code.  DVT Prophylaxis:   apixaban  (ELIQUIS ) tablet 5 mg   Level of Care: Level of care: Med-Surg Family Communication: none at bedside.   Disposition Plan:     Remains inpatient appropriate:  placement.   Procedures:  None.   Consultants:   None.   Antimicrobials:   Anti-infectives (From admission, onward)    None        Medications  Scheduled Meds:  apixaban   5 mg Oral BID   carbamide peroxide  5 drop Both EARS BID   carvedilol   6.25 mg Oral BID WC   furosemide   40 mg Oral Daily   gabapentin   200 mg Oral BID   losartan   50 mg Oral Daily   revefenacin   175 mcg Nebulization Daily   rosuvastatin   5 mg Oral QHS   senna-docusate  1 tablet Oral BID   spironolactone   12.5 mg Oral Daily   Continuous Infusions: PRN Meds:.albuterol , feeding supplement,  oxyCODONE     Subjective:   [name] was seen and examined today.  No new complaints.   Objective:   Vitals:   04/12/24 0858 04/12/24 0900 04/12/24 2009 04/13/24 0800  BP:   (!) 145/92 127/87  Pulse:  72 65 72  Resp:  17 19 19   Temp:   97.7 F (36.5 C) 97.8 F (36.6 C)  TempSrc:   Oral Oral  SpO2: 99%  100% 97%  Weight:      Height:        Intake/Output Summary (Last 24 hours) at 04/13/2024 1737 Last data filed at 04/13/2024 1000 Gross per 24 hour  Intake 240 ml  Output --  Net 240 ml   Filed Weights   03/26/24 0431 03/28/24 0456 04/04/24 0500  Weight: 42.7 kg 47.5 kg 61.7 kg     Exam General: Alert and oriented x 3, NAD Cardiovascular: S1 S2 auscultated, no murmurs, RRR Respiratory: Clear to auscultation bilaterally,  Gastrointestinal: Soft, nontender, nondistended, + bowel sounds Ext: no pedal edema bilaterally Neuro: AAOx3,  Skin: No rashes Psych: normal affect.    Data Reviewed:  I have personally reviewed following labs and imaging studies   CBC Lab Results  Component Value Date   WBC 5.9 04/10/2024   RBC 5.09 04/10/2024   HGB 15.6 04/10/2024   HCT 46.8 04/10/2024   MCV 91.9 04/10/2024   MCH 30.6 04/10/2024   PLT 196 04/10/2024   MCHC 33.3 04/10/2024   RDW 14.8 04/10/2024   LYMPHSABS 1.6 03/04/2024   MONOABS 0.7 03/04/2024   EOSABS 0.0 03/04/2024   BASOSABS 0.0 03/04/2024     Last metabolic panel Lab Results  Component Value Date   NA 139 04/10/2024   K 4.6 04/10/2024   CL 104 04/10/2024   CO2 25 04/10/2024   BUN 21 (H) 04/10/2024   CREATININE 1.03 04/10/2024   GLUCOSE 83 04/10/2024   GFRNONAA >60 04/10/2024   GFRAA >60 08/21/2020   CALCIUM  9.3 04/10/2024   PHOS 2.7 01/16/2024   PROT 7.0 03/10/2024   ALBUMIN 3.3 (L) 03/10/2024   BILITOT 1.6 (H) 03/10/2024   ALKPHOS 63 03/10/2024   AST 17 03/10/2024   ALT 30 03/10/2024   ANIONGAP 10 04/10/2024    CBG (last 3)  No results for input(s): "GLUCAP" in the last 72 hours.     Coagulation Profile: No results for input(s): "INR", "PROTIME" in the last 168 hours.   Radiology Studies: No results found.     Feliciana Horn M.D. [facility] 04/13/2024, 5:37 PM  Available via Epic secure chat 7am-7pm After 7 pm, please refer to night coverage provider listed on amion.

## 2024-04-14 DIAGNOSIS — I10 Essential (primary) hypertension: Secondary | ICD-10-CM | POA: Diagnosis not present

## 2024-04-14 DIAGNOSIS — R569 Unspecified convulsions: Secondary | ICD-10-CM | POA: Diagnosis not present

## 2024-04-14 DIAGNOSIS — E1151 Type 2 diabetes mellitus with diabetic peripheral angiopathy without gangrene: Secondary | ICD-10-CM | POA: Diagnosis not present

## 2024-04-14 DIAGNOSIS — I639 Cerebral infarction, unspecified: Secondary | ICD-10-CM | POA: Diagnosis not present

## 2024-04-14 NOTE — Progress Notes (Signed)
 [facility]                                                                               [name], is a 58 y.o. male, DOB - [birth year], ZOX:[id number] Admit date - 03/04/2024    Outpatient Primary MD for the patient is Abraham Abo, MD  LOS - 41  days    Brief summary   58 year old with a history of cardiomyopathy and cocaine abuse who presented to the ER 03/04/2024 with slurred speech, confusion, and right-sided weakness. CT head noted a left MCA infarct with CTa of the head and neck revealing an M2 occlusion on the left. Echocardiogram revealed a large mobile left ventricular thrombus with left atrial thrombus as well as right ventricular apical thrombus and an EF of <20%. PT/OT have recommended SNF placement on discharge. The patient has been found to lack medical decision-making capacity such that he is not able to determine his discharge disposition nor is he allowed to sign out AMA.    Assessment & Plan    Assessment and Plan: * Acute CVA (cerebrovascular accident) (HCC) Has undergone extensive workup with CT revealing left MCA territory infarct affecting the insula, caudate nucleus, lentiform nucleus, and internal capsule - MRI confirmed these infarcts and also revealed evidence of multiple old cerebellar infarcts as well as findings of chronic small vessel ischemia -TTE noted a large mobile clot in the LV attached to the intraventricular septum just under the mitral valve as well as severely decreased global LV function with an EF of 20% as well as a small RV apical clot and severely reduced RV systolic function as well as a large and mobile clot in the left atrium -LDL 71 -A1c 5.8 -Continue Eliquis  for intracardiac thrombus - patient has been counseled on multiple occasions on need for very consistent Eliquis  compliance to avoid recurrent stroke and even possible death -though psychiatry has deemed him incompetent he is still very much able to physically resist  complying with care and there is no safe way to force medications on him but he refuses to take -as a result the nursing staff and the physician simply make multiple attempts throughout the day to convince the patient of the need to comply with medical therapy  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 Continue Eliquis  - no gross volume overload at present - continue Lasix , losartan , Coreg , Aldactone , and Crestor  - will need outpatient Cardiology follow-up   Schizoaffective disorder bipolar type -agitation -lack of capacity Psychiatry has assisted in the care of this patient during his hospitalization - if patient attempts to leave the hospital he should be placed under involuntary commitment -TOC has contacted APS to pursue guardianship as patient lacks decision-making capacity - Psychiatry is presently recommending we await APS decision on guardianship prior to attempting to discharge him   Back pain Pain better controlled today.   HTN Well controlled.     HLD Continue Crestor    DM2 CBG (last 3)  No results for input(s): "GLUCAP" in the last 72 hours. Well-controlled with A1c 5.8 -    Polysubstance abuse UDS positive for cocaine and benzodiazepines - has admitted to habitual use  of crack cocaine   Deconditioning Ambulatory status complicated by previous right AKA and left BKA -patient has no insurance coverage for SNF   COPD (chronic obstructive pulmonary disease) (HCC) No wheezing heard.  Continue trelegy and SABA prn    Fullness in both ears  Dubrox ear drops ordered.     History of DVT (deep vein thrombosis) Resume eliquis .    Status post bilateral BKA. Chronic pain -continue the use of gabapentin , oxycodone  and muscle relaxants when can safely swallow PRN IV morphine  for now      Estimated body mass index is 20.09 kg/m as calculated from the following:   Height as of this encounter: 5\' 9"  (1.753 m).   Weight as of this  encounter: 61.7 kg.  Code Status:  full code.  DVT Prophylaxis:   apixaban  (ELIQUIS ) tablet 5 mg   Level of Care: Level of care: Med-Surg Family Communication: none at bedside.   Disposition Plan:     Remains inpatient appropriate:  placement.   Procedures:  None.   Consultants:   None.   Antimicrobials:   Anti-infectives (From admission, onward)    None        Medications  Scheduled Meds:  apixaban   5 mg Oral BID   carbamide peroxide  5 drop Both EARS BID   carvedilol   6.25 mg Oral BID WC   furosemide   40 mg Oral Daily   gabapentin   200 mg Oral BID   losartan   50 mg Oral Daily   revefenacin   175 mcg Nebulization Daily   rosuvastatin   5 mg Oral QHS   senna-docusate  1 tablet Oral BID   spironolactone   12.5 mg Oral Daily   Continuous Infusions: PRN Meds:.albuterol , feeding supplement, oxyCODONE     Subjective:   [name] was seen and examined today.  No new complaints.   Objective:   Vitals:   04/12/24 2009 04/13/24 0800 04/13/24 1957 04/14/24 0927  BP: (!) 145/92 127/87 (!) 140/79   Pulse: 65 72 70   Resp: 19 19 18    Temp: 97.7 F (36.5 C) 97.8 F (36.6 C) 97.6 F (36.4 C)   TempSrc: Oral Oral Oral   SpO2: 100% 97% 98% 97%  Weight:      Height:        Intake/Output Summary (Last 24 hours) at 04/14/2024 1743 Last data filed at 04/14/2024 1036 Gross per 24 hour  Intake 600 ml  Output 700 ml  Net -100 ml   Filed Weights   03/26/24 0431 03/28/24 0456 04/04/24 0500  Weight: 42.7 kg 47.5 kg 61.7 kg     Exam General exam: Appears calm and comfortable  Respiratory system: Clear to auscultation. Respiratory effort normal. Cardiovascular system: S1 & S2 heard, RRR. No JVD Gastrointestinal system: Abdomen is nondistended, soft and nontender.  Central nervous system: Alert and oriented to person and place.  Extremities:bilateral BKA.  Skin: No rashes, Psychiatry: calm.  Data Reviewed:  I have personally reviewed following labs and  imaging studies   CBC Lab Results  Component Value Date   WBC 5.9 04/10/2024   RBC 5.09 04/10/2024   HGB 15.6 04/10/2024   HCT 46.8 04/10/2024   MCV 91.9 04/10/2024   MCH 30.6 04/10/2024   PLT 196 04/10/2024   MCHC 33.3 04/10/2024   RDW 14.8 04/10/2024   LYMPHSABS 1.6 03/04/2024   MONOABS 0.7 03/04/2024   EOSABS 0.0 03/04/2024   BASOSABS 0.0 03/04/2024     Last metabolic panel Lab Results  Component Value  Date   NA 139 04/10/2024   K 4.6 04/10/2024   CL 104 04/10/2024   CO2 25 04/10/2024   BUN 21 (H) 04/10/2024   CREATININE 1.03 04/10/2024   GLUCOSE 83 04/10/2024   GFRNONAA >60 04/10/2024   GFRAA >60 08/21/2020   CALCIUM  9.3 04/10/2024   PHOS 2.7 01/16/2024   PROT 7.0 03/10/2024   ALBUMIN 3.3 (L) 03/10/2024   BILITOT 1.6 (H) 03/10/2024   ALKPHOS 63 03/10/2024   AST 17 03/10/2024   ALT 30 03/10/2024   ANIONGAP 10 04/10/2024    CBG (last 3)  No results for input(s): "GLUCAP" in the last 72 hours.    Coagulation Profile: No results for input(s): "INR", "PROTIME" in the last 168 hours.   Radiology Studies: No results found.     Feliciana Horn M.D. [facility] 04/14/2024, 5:43 PM  Available via Epic secure chat 7am-7pm After 7 pm, please refer to night coverage provider listed on amion.

## 2024-04-14 NOTE — Progress Notes (Signed)
 Speech Language Pathology Treatment: Cognitive-Linquistic  Patient Details Name: [name] MRN: [id number] DOB: September 29, 1966 Today's Date: 04/14/2024 Time: 9562-1308 SLP Time Calculation (min) (ACUTE ONLY): 14 min  Assessment / Plan / Recommendation Clinical Impression  Pt engaged in limited speech therapy session today. He was initially agreeable to trying some more diagnostic therapy today, although noted some confusion because he thought SLP was coming on Monday. SLP reminded him of plan for SLP to come Thursday, noting in PT and OT notes on previous date that they reminded him of this, and that they had made plans to return on Monday.  SLP initiated confrontational naming task, with pt producing one word correctly before making a semantic paraphasia error on second word, at which time he became frustrated and would not name any other items. SLP provided a lot of encouragement and reinforcement, but he would not return attention to task. Attempted a divergent naming task based on personal preferences listed during initial eval, but pt did not produce any words. At this point, he said he was getting upset because SLP was supposed to come on Monday.   Note that while getting more frustrated, speech intelligibility and word-finding both declined. SLP brought this to his attention and he sustained attention to SLP long enough to write out speech intelligibility strategies for him. Pt read 4/5 words out loud and accurately, and SLP provided education about what they mean. Positive reinforcement given to him for his ability to use these strategies when he is not as upset. We also discussed bringing a notebook and/or calendar in future session - something to help him keep up with when people have come to see him or when they plan to return. He is more agreeable to this when SLP said she would return next week to try.    HPI HPI: Patient is 58 y.o. male presented to ED on 03/04/24 with stroke like symptoms  when caregiver found him with slurred speech and right sided weakness. Head CT reveals Acute left MCA territory infarct affecting the insula, caudate nucleus, lentiform nucleus and internal capsule. Pt initially evaluated by SLP 4/5 but s/o 4/22 due to repeated refusals. SLP reordered 5/13 for repeat cognitive-linguistic eval.   PMH significant of  Schizoaffective disorder, heart failure with reduced ejection fraction, pretension, DMII, COPD, s/p bilateral amputations, chronic pain, and polysubstance abuse.      SLP Plan  Continue with current plan of care      Recommendations for follow up therapy are one component of a multi-disciplinary discharge planning process, led by the attending physician.  Recommendations may be updated based on patient status, additional functional criteria and insurance authorization.    Recommendations                         Frequent or constant Supervision/Assistance Dysarthria and anarthria (R47.1);Aphasia (R47.01)     Continue with current plan of care     Beth Brooke., M.A. CCC-SLP Acute Rehabilitation Services Office: [phone]  Secure chat preferred   04/14/2024, 11:51 AM

## 2024-04-15 DIAGNOSIS — R569 Unspecified convulsions: Secondary | ICD-10-CM | POA: Diagnosis not present

## 2024-04-15 DIAGNOSIS — I10 Essential (primary) hypertension: Secondary | ICD-10-CM | POA: Diagnosis not present

## 2024-04-15 DIAGNOSIS — E1151 Type 2 diabetes mellitus with diabetic peripheral angiopathy without gangrene: Secondary | ICD-10-CM | POA: Diagnosis not present

## 2024-04-15 DIAGNOSIS — I639 Cerebral infarction, unspecified: Secondary | ICD-10-CM | POA: Diagnosis not present

## 2024-04-15 NOTE — Plan of Care (Signed)
  Problem: Education: Goal: Knowledge of disease or condition will improve 04/15/2024 1319 by Cyrus Drone, RN Outcome: Not Progressing 04/15/2024 1316 by Cyrus Drone, RN Outcome: Not Progressing Goal: Knowledge of secondary prevention will improve (MUST DOCUMENT ALL) 04/15/2024 1319 by Cyrus Drone, RN Outcome: Not Progressing 04/15/2024 1316 by Cyrus Drone, RN Outcome: Not Progressing Goal: Knowledge of patient specific risk factors will improve (DELETE if not current risk factor) 04/15/2024 1319 by Cyrus Drone, RN Outcome: Not Progressing 04/15/2024 1316 by Cyrus Drone, RN Outcome: Not Progressing   Problem: Ischemic Stroke/TIA Tissue Perfusion: Goal: Complications of ischemic stroke/TIA will be minimized 04/15/2024 1319 by Cyrus Drone, RN Outcome: Not Progressing 04/15/2024 1316 by Cyrus Drone, RN Outcome: Not Progressing   Problem: Coping: Goal: Will verbalize positive feelings about self 04/15/2024 1319 by Cyrus Drone, RN Outcome: Not Progressing 04/15/2024 1316 by Cyrus Drone, RN Outcome: Not Progressing Goal: Will identify appropriate support needs 04/15/2024 1319 by Cyrus Drone, RN Outcome: Not Progressing 04/15/2024 1316 by Cyrus Drone, RN Outcome: Not Progressing   Problem: Health Behavior/Discharge Planning: Goal: Ability to manage health-related needs will improve 04/15/2024 1319 by Cyrus Drone, RN Outcome: Not Progressing 04/15/2024 1316 by Cyrus Drone, RN Outcome: Not Progressing Goal: Goals will be collaboratively established with patient/family 04/15/2024 1319 by Cyrus Drone, RN Outcome: Not Progressing 04/15/2024 1316 by Cyrus Drone, RN Outcome: Not Progressing   Problem: Self-Care: Goal: Ability to participate in self-care as condition permits will improve 04/15/2024 1319 by Cyrus Drone, RN Outcome: Not Progressing 04/15/2024 1316 by Cyrus Drone, RN Outcome: Not Progressing Goal: Verbalization of feelings and  concerns over difficulty with self-care will improve 04/15/2024 1319 by Cyrus Drone, RN Outcome: Not Progressing 04/15/2024 1316 by Cyrus Drone, RN Outcome: Not Progressing Goal: Ability to communicate needs accurately will improve 04/15/2024 1319 by Cyrus Drone, RN Outcome: Not Progressing 04/15/2024 1316 by Cyrus Drone, RN Outcome: Not Progressing   Problem: Nutrition: Goal: Risk of aspiration will decrease 04/15/2024 1319 by Cyrus Drone, RN Outcome: Not Progressing 04/15/2024 1316 by Cyrus Drone, RN Outcome: Not Progressing Goal: Dietary intake will improve 04/15/2024 1319 by Cyrus Drone, RN Outcome: Not Progressing 04/15/2024 1316 by Cyrus Drone, RN Outcome: Not Progressing

## 2024-04-15 NOTE — Plan of Care (Signed)
  Problem: Education: Goal: Knowledge of disease or condition will improve Outcome: Not Progressing Goal: Knowledge of secondary prevention will improve (MUST DOCUMENT ALL) Outcome: Not Progressing Goal: Knowledge of patient specific risk factors will improve (DELETE if not current risk factor) Outcome: Not Progressing   Problem: Ischemic Stroke/TIA Tissue Perfusion: Goal: Complications of ischemic stroke/TIA will be minimized Outcome: Not Progressing   Problem: Coping: Goal: Will verbalize positive feelings about self Outcome: Not Progressing Goal: Will identify appropriate support needs Outcome: Not Progressing   Problem: Health Behavior/Discharge Planning: Goal: Ability to manage health-related needs will improve Outcome: Not Progressing Goal: Goals will be collaboratively established with patient/family Outcome: Not Progressing   Problem: Self-Care: Goal: Ability to participate in self-care as condition permits will improve Outcome: Not Progressing Goal: Verbalization of feelings and concerns over difficulty with self-care will improve Outcome: Not Progressing Goal: Ability to communicate needs accurately will improve Outcome: Not Progressing   Problem: Nutrition: Goal: Risk of aspiration will decrease Outcome: Not Progressing Goal: Dietary intake will improve Outcome: Not Progressing

## 2024-04-15 NOTE — Progress Notes (Signed)
 [facility]                                                                               [name], is a 58 y.o. male, DOB - [birth year], ZOX:[id number] Admit date - 03/04/2024    Outpatient Primary MD for the patient is Abraham Abo, MD  LOS - 42  days    Brief summary   58 year old with a history of cardiomyopathy and cocaine abuse who presented to the ER 03/04/2024 with slurred speech, confusion, and right-sided weakness. CT head noted a left MCA infarct with CTa of the head and neck revealing an M2 occlusion on the left. Echocardiogram revealed a large mobile left ventricular thrombus with left atrial thrombus as well as right ventricular apical thrombus and an EF of <20%. PT/OT have recommended SNF placement on discharge. The patient has been found to lack medical decision-making capacity such that he is not able to determine his discharge disposition nor is he allowed to sign out AMA.    Assessment & Plan    Assessment and Plan: * Acute CVA (cerebrovascular accident) (HCC) Has undergone extensive workup with CT revealing left MCA territory infarct affecting the insula, caudate nucleus, lentiform nucleus, and internal capsule - MRI confirmed these infarcts and also revealed evidence of multiple old cerebellar infarcts as well as findings of chronic small vessel ischemia -TTE noted a large mobile clot in the LV attached to the intraventricular septum just under the mitral valve as well as severely decreased global LV function with an EF of 20% as well as a small RV apical clot and severely reduced RV systolic function as well as a large and mobile clot in the left atrium -LDL 71 -A1c 5.8 -Continue Eliquis  for intracardiac thrombus - patient has been counseled on multiple occasions on need for very consistent Eliquis  compliance to avoid recurrent stroke and even possible death -though psychiatry has deemed him incompetent he is still very much able to physically resist  complying with care and there is no safe way to force medications on him but he refuses to take -as a result the nursing staff and the physician simply make multiple attempts throughout the day to convince the patient of the need to comply with medical therapy  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 Continue Eliquis  - no gross volume overload at present - continue Lasix , losartan , Coreg , Aldactone , and Crestor  - will need outpatient Cardiology follow-up   Schizoaffective disorder bipolar type -agitation -lack of capacity Psychiatry has assisted in the care of this patient during his hospitalization - if patient attempts to leave the hospital he should be placed under involuntary commitment -TOC has contacted APS to pursue guardianship as patient lacks decision-making capacity - Psychiatry is presently recommending we await APS decision on guardianship prior to attempting to discharge him   Back pain Pain better controlled today.   HTN Well controlled.     HLD Continue Crestor    DM2 CBG (last 3)  No results for input(s): "GLUCAP" in the last 72 hours. Well-controlled with A1c 5.8 -    Polysubstance abuse UDS positive for cocaine and benzodiazepines - has admitted to habitual use  of crack cocaine   Deconditioning Ambulatory status complicated by previous right AKA and left BKA -patient has no insurance coverage for SNF   COPD (chronic obstructive pulmonary disease) (HCC) No wheezing heard.  Continue trelegy and SABA prn    Fullness in both ears  Dubrox ear drops ordered.     History of DVT (deep vein thrombosis) Resume eliquis .    Status post bilateral BKA. Chronic pain -continue the use of gabapentin , oxycodone  and muscle relaxants when can safely swallow PRN IV morphine  for now      Estimated body mass index is 20.09 kg/m as calculated from the following:   Height as of this encounter: 5\' 9"  (1.753 m).   Weight as of this  encounter: 61.7 kg.  Code Status:  full code.  DVT Prophylaxis:   apixaban  (ELIQUIS ) tablet 5 mg   Level of Care: Level of care: Med-Surg Family Communication: none at bedside.   Disposition Plan:     Remains inpatient appropriate:  placement.   Procedures:  None.   Consultants:   None.   Antimicrobials:   Anti-infectives (From admission, onward)    None        Medications  Scheduled Meds:  apixaban   5 mg Oral BID   carbamide peroxide  5 drop Both EARS BID   carvedilol   6.25 mg Oral BID WC   furosemide   40 mg Oral Daily   gabapentin   200 mg Oral BID   losartan   50 mg Oral Daily   revefenacin   175 mcg Nebulization Daily   rosuvastatin   5 mg Oral QHS   senna-docusate  1 tablet Oral BID   spironolactone   12.5 mg Oral Daily   Continuous Infusions: PRN Meds:.albuterol , feeding supplement, oxyCODONE     Subjective:   [name] was seen and examined today.  No new complaints.   Objective:   Vitals:   04/13/24 1957 04/14/24 0927 04/14/24 1951 04/15/24 0717  BP: (!) 140/79  137/87   Pulse: 70  70 68  Resp: 18  18 18   Temp: 97.6 F (36.4 C)  97.6 F (36.4 C)   TempSrc: Oral  Oral   SpO2: 98% 97% 98%   Weight:      Height:        Intake/Output Summary (Last 24 hours) at 04/15/2024 1433 Last data filed at 04/15/2024 0800 Gross per 24 hour  Intake --  Output 600 ml  Net -600 ml   Filed Weights   03/26/24 0431 03/28/24 0456 04/04/24 0500  Weight: 42.7 kg 47.5 kg 61.7 kg     Exam General exam: Appears calm and comfortable  Respiratory system: Clear to auscultation. Respiratory effort normal. Cardiovascular system: S1 & S2 heard, RRR. No JVD Gastrointestinal system: Abdomen is nondistended, soft and nontender.  Central nervous system: Alert and oriented to person and place.  Extremities:bilateral BKA.  Skin: No rashes, Psychiatry: calm.  Data Reviewed:  I have personally reviewed following labs and imaging studies   CBC Lab Results   Component Value Date   WBC 5.9 04/10/2024   RBC 5.09 04/10/2024   HGB 15.6 04/10/2024   HCT 46.8 04/10/2024   MCV 91.9 04/10/2024   MCH 30.6 04/10/2024   PLT 196 04/10/2024   MCHC 33.3 04/10/2024   RDW 14.8 04/10/2024   LYMPHSABS 1.6 03/04/2024   MONOABS 0.7 03/04/2024   EOSABS 0.0 03/04/2024   BASOSABS 0.0 03/04/2024     Last metabolic panel Lab Results  Component Value Date   NA 139  04/10/2024   K 4.6 04/10/2024   CL 104 04/10/2024   CO2 25 04/10/2024   BUN 21 (H) 04/10/2024   CREATININE 1.03 04/10/2024   GLUCOSE 83 04/10/2024   GFRNONAA >60 04/10/2024   GFRAA >60 08/21/2020   CALCIUM  9.3 04/10/2024   PHOS 2.7 01/16/2024   PROT 7.0 03/10/2024   ALBUMIN 3.3 (L) 03/10/2024   BILITOT 1.6 (H) 03/10/2024   ALKPHOS 63 03/10/2024   AST 17 03/10/2024   ALT 30 03/10/2024   ANIONGAP 10 04/10/2024    CBG (last 3)  No results for input(s): "GLUCAP" in the last 72 hours.    Coagulation Profile: No results for input(s): "INR", "PROTIME" in the last 168 hours.   Radiology Studies: No results found.     Feliciana Horn M.D. [facility] 04/15/2024, 2:33 PM  Available via Epic secure chat 7am-7pm After 7 pm, please refer to night coverage provider listed on amion.

## 2024-04-15 NOTE — Progress Notes (Signed)
 Patient refusing VS this AM and also losartan , lasix , aldactone  and senna MD notified.

## 2024-04-15 NOTE — TOC Progression Note (Signed)
 Transition of Care [facility]) - Progression Note    Patient Details  Name: [name] MRN: [id number] Date of Birth: 08-25-1966  [facility] CM/SW Contact  Tandy Fam, [name] Phone Number: 04/15/2024, 2:22 PM  Clinical Narrative:   CSW spoke with Sherrlyn Dolores with ACTT Team, she has been contacted by APS and attempting to call back to provide information on patient. Sherrlyn Dolores also went by patient's apartment today but was unable to get inside for his wheelchair or prosthesis. However, there was a sign on the door that he is being evicted in July. Patient is not yet aware, she will bring the information in early next week and provide CSW with a copy. Sherrlyn Dolores wanted to confirm that the plan at this time is still for facility placement for the patient, and CSW indicated that the medical team still does not feel that patient can go home by himself and manage his own affairs; he will need placement. However, placement cannot be pursued until patient has a Education officer, environmental and his insurance is changed from [place], as [place] will not cover placement for the patient. CSW to continue to follow.    Expected Discharge Plan: Skilled Nursing Facility Barriers to Discharge: Continued Medical Work up, English as a second language teacher, Active Substance Use - Placement, Inadequate or no insurance, SNF Pending payor source - LOG, SNF Pending Medicaid, SNF Pending bed offer  Expected Discharge Plan and Services       Living arrangements for the past 2 months: Apartment Expected Discharge Date: 03/14/24                                     Social Determinants of Health (SDOH) Interventions SDOH Screenings   Food Insecurity: Food Insecurity Present (03/06/2024)  Housing: High Risk (03/06/2024)  Transportation Needs: No Transportation Needs (03/06/2024)  Utilities: Patient Declined (03/06/2024)  Alcohol Screen: Medium Risk (06/24/2019)  Depression (PHQ2-9): Low Risk  (02/18/2024)  Recent Concern:  Depression (PHQ2-9) - Medium Risk (01/21/2024)  Financial Resource Strain: High Risk (07/24/2021)   Received from [facility], [facility]  Physical Activity: Inactive (02/18/2024)  Social Connections: Unknown (04/05/2022)   Received from [facility], [facility]  Stress: Stress Concern Present (07/24/2021)   Received from [facility], [facility]  Tobacco Use: High Risk (03/04/2024)  Health Literacy: Adequate Health Literacy (02/18/2024)    Readmission Risk Interventions    12/21/2023    9:25 AM 12/09/2022    9:02 AM 12/01/2022    3:56 PM  Readmission Risk Prevention Plan  Transportation Screening Complete Complete Complete  HRI or Home Care Consult Complete    Social Work Consult for Recovery Care Planning/Counseling Complete    Palliative Care Screening Not Applicable    Medication Review Oceanographer) Complete Complete Complete  PCP or Specialist appointment within 3-5 days of discharge  Complete Complete  HRI or Home Care Consult  Complete Complete  SW Recovery Care/Counseling Consult  Complete Complete  Palliative Care Screening  Not Applicable Not Applicable  Skilled Nursing Facility  Not Applicable Not Applicable  SNF Comments   n/a

## 2024-04-16 DIAGNOSIS — I10 Essential (primary) hypertension: Secondary | ICD-10-CM | POA: Diagnosis not present

## 2024-04-16 DIAGNOSIS — I639 Cerebral infarction, unspecified: Secondary | ICD-10-CM | POA: Diagnosis not present

## 2024-04-16 DIAGNOSIS — E1151 Type 2 diabetes mellitus with diabetic peripheral angiopathy without gangrene: Secondary | ICD-10-CM | POA: Diagnosis not present

## 2024-04-16 DIAGNOSIS — R569 Unspecified convulsions: Secondary | ICD-10-CM | POA: Diagnosis not present

## 2024-04-16 LAB — BASIC METABOLIC PANEL WITH GFR
Anion gap: 13 (ref 5–15)
BUN: 18 mg/dL (ref 6–20)
CO2: 20 mmol/L — ABNORMAL LOW (ref 22–32)
Calcium: 9.5 mg/dL (ref 8.9–10.3)
Chloride: 104 mmol/L (ref 98–111)
Creatinine, Ser: 0.91 mg/dL (ref 0.61–1.24)
GFR, Estimated: >60 mL/min (ref >60)
Glucose, Bld: 107 mg/dL — ABNORMAL HIGH (ref 70–99)
Potassium: 4.2 mmol/L (ref 3.5–5.1)
Sodium: 137 mmol/L (ref 135–145)

## 2024-04-16 LAB — CBC WITH DIFFERENTIAL/PLATELET
Abs Immature Granulocytes: 0.01 10*3/uL (ref 0.00–0.07)
Basophils Absolute: 0 10*3/uL (ref 0.0–0.1)
Basophils Relative: 1 %
Eosinophils Absolute: 0.1 10*3/uL (ref 0.0–0.5)
Eosinophils Relative: 2 %
HCT: 44.4 % (ref 39.0–52.0)
Hemoglobin: 14.9 g/dL (ref 13.0–17.0)
Immature Granulocytes: 0 %
Lymphocytes Relative: 49 %
Lymphs Abs: 2.7 10*3/uL (ref 0.7–4.0)
MCH: 30.3 pg (ref 26.0–34.0)
MCHC: 33.6 g/dL (ref 30.0–36.0)
MCV: 90.2 fL (ref 80.0–100.0)
Monocytes Absolute: 0.7 10*3/uL (ref 0.1–1.0)
Monocytes Relative: 12 %
Neutro Abs: 2 10*3/uL (ref 1.7–7.7)
Neutrophils Relative %: 36 %
Platelets: 163 10*3/uL (ref 150–400)
RBC: 4.92 MIL/uL (ref 4.22–5.81)
RDW: 14.6 % (ref 11.5–15.5)
WBC: 5.5 10*3/uL (ref 4.0–10.5)
nRBC: 0 % (ref 0.0–0.2)

## 2024-04-16 NOTE — Progress Notes (Signed)
 [facility]                                                                               [name], is a 58 y.o. male, DOB - 04/13/1966, [id number] Admit date - 03/04/2024    Outpatient Primary MD for the patient is Abraham Abo, MD  LOS - 43  days    Brief summary   58 year old with a history of cardiomyopathy and cocaine abuse who presented to the ER 03/04/2024 with slurred speech, confusion, and right-sided weakness. CT head noted a left MCA infarct with CTa of the head and neck revealing an M2 occlusion on the left. Echocardiogram revealed a large mobile left ventricular thrombus with left atrial thrombus as well as right ventricular apical thrombus and an EF of <20%. PT/OT have recommended SNF placement on discharge. The patient has been found to lack medical decision-making capacity such that he is not able to determine his discharge disposition nor is he allowed to sign out AMA.    Assessment & Plan    Assessment and Plan: * Acute CVA (cerebrovascular accident) (HCC) Has undergone extensive workup with CT revealing left MCA territory infarct affecting the insula, caudate nucleus, lentiform nucleus, and internal capsule - MRI confirmed these infarcts and also revealed evidence of multiple old cerebellar infarcts as well as findings of chronic small vessel ischemia -TTE noted a large mobile clot in the LV attached to the intraventricular septum just under the mitral valve as well as severely decreased global LV function with an EF of 20% as well as a small RV apical clot and severely reduced RV systolic function as well as a large and mobile clot in the left atrium -LDL 71 -A1c 5.8 -Continue Eliquis  for intracardiac thrombus - patient has been counseled on multiple occasions on need for very consistent Eliquis  compliance to avoid recurrent stroke and even possible death -though psychiatry has deemed him incompetent he is still very much able to physically resist  complying with care and there is no safe way to force medications on him but he refuses to take -as a result the nursing staff and the physician simply make multiple attempts throughout the day to convince the patient of the need to comply with medical therapy. Today he has refused to be examined, esp his ears as he reported fullness of the ears.  He continues to refuse the senokot.   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 Continue Eliquis  - no gross volume overload at present - continue Lasix , losartan , Coreg , Aldactone , and Crestor  - will need outpatient Cardiology follow-up   Schizoaffective disorder bipolar type -agitation -lack of capacity Psychiatry has assisted in the care of this patient during his hospitalization - if patient attempts to leave the hospital he should be placed under involuntary commitment -TOC has contacted APS to pursue guardianship as patient lacks decision-making capacity - Psychiatry is presently recommending we await APS decision on guardianship prior to attempting to discharge him. Patient continues to refuse medications.  He refused to be examined today.    Back pain Pain better controlled today.   HTN Optimal.     HLD Continue Crestor   DM2 CBG (last 3)  No results for input(s): "GLUCAP" in the last 72 hours. Well-controlled with A1c 5.8 -    Polysubstance abuse UDS positive for cocaine and benzodiazepines - has admitted to habitual use of crack cocaine   Deconditioning Ambulatory status complicated by previous right AKA and left BKA -patient has no insurance coverage for SNF   COPD (chronic obstructive pulmonary disease) (HCC) No wheezing heard.  Continue trelegy and SABA prn    Fullness in both ears  Dubrox ear drops ordered.     History of DVT (deep vein thrombosis) Resume eliquis .    Status post bilateral BKA. Chronic pain -continue the use of gabapentin , oxycodone  and muscle relaxants when  can safely swallow PRN IV morphine  for now      Estimated body mass index is 20.09 kg/m as calculated from the following:   Height as of this encounter: 5\' 9"  (1.753 m).   Weight as of this encounter: 61.7 kg.  Code Status:  full code.  DVT Prophylaxis:   apixaban  (ELIQUIS ) tablet 5 mg   Level of Care: Level of care: Med-Surg Family Communication: none at bedside.   Disposition Plan:     Remains inpatient appropriate:  placement.   Procedures:  None.   Consultants:   None.   Antimicrobials:   Anti-infectives (From admission, onward)    None        Medications  Scheduled Meds:  apixaban   5 mg Oral BID   carbamide peroxide  5 drop Both EARS BID   carvedilol   6.25 mg Oral BID WC   furosemide   40 mg Oral Daily   gabapentin   200 mg Oral BID   losartan   50 mg Oral Daily   revefenacin   175 mcg Nebulization Daily   rosuvastatin   5 mg Oral QHS   senna-docusate  1 tablet Oral BID   spironolactone   12.5 mg Oral Daily   Continuous Infusions: PRN Meds:.albuterol , feeding supplement, oxyCODONE     Subjective:   [name] was seen and examined today.  Refused to talk to me.   Objective:   Vitals:   04/15/24 0717 04/15/24 2319 04/16/24 0910 04/16/24 1158  BP:  (!) 140/98 129/77   Pulse: 68 67 74   Resp: 18 18 19    Temp:  98.7 F (37.1 C)    TempSrc:  Oral    SpO2:  100% 96% 98%  Weight:      Height:        Intake/Output Summary (Last 24 hours) at 04/16/2024 1651 Last data filed at 04/16/2024 1300 Gross per 24 hour  Intake 588 ml  Output 1300 ml  Net -712 ml   Filed Weights   03/26/24 0431 03/28/24 0456 04/04/24 0500  Weight: 42.7 kg 47.5 kg 61.7 kg     Exam Patient appears comfortable.  He refused further exam.    Data Reviewed:  I have personally reviewed following labs and imaging studies   CBC Lab Results  Component Value Date   WBC 5.5 04/16/2024   RBC 4.92 04/16/2024   HGB 14.9 04/16/2024   HCT 44.4 04/16/2024   MCV 90.2  04/16/2024   MCH 30.3 04/16/2024   PLT 163 04/16/2024   MCHC 33.6 04/16/2024   RDW 14.6 04/16/2024   LYMPHSABS 2.7 04/16/2024   MONOABS 0.7 04/16/2024   EOSABS 0.1 04/16/2024   BASOSABS 0.0 04/16/2024     Last metabolic panel Lab Results  Component Value Date   NA 137 04/16/2024   K 4.2  04/16/2024   CL 104 04/16/2024   CO2 20 (L) 04/16/2024   BUN 18 04/16/2024   CREATININE 0.91 04/16/2024   GLUCOSE 107 (H) 04/16/2024   GFRNONAA >60 04/16/2024   GFRAA >60 08/21/2020   CALCIUM  9.5 04/16/2024   PHOS 2.7 01/16/2024   PROT 7.0 03/10/2024   ALBUMIN 3.3 (L) 03/10/2024   BILITOT 1.6 (H) 03/10/2024   ALKPHOS 63 03/10/2024   AST 17 03/10/2024   ALT 30 03/10/2024   ANIONGAP 13 04/16/2024    CBG (last 3)  No results for input(s): "GLUCAP" in the last 72 hours.    Coagulation Profile: No results for input(s): "INR", "PROTIME" in the last 168 hours.   Radiology Studies: No results found.     Feliciana Horn M.D. [facility] 04/16/2024, 4:51 PM  Available via Epic secure chat 7am-7pm After 7 pm, please refer to night coverage provider listed on amion.

## 2024-04-16 NOTE — Progress Notes (Signed)
 Patient refusing scheduled Senokot. Not BM documented for 4 days. Patient refusing Eliquis  and all other PM shift meds until he can take his oxycodone  without having to comply with Senokot (see oxy admin instructions).

## 2024-04-17 DIAGNOSIS — E1151 Type 2 diabetes mellitus with diabetic peripheral angiopathy without gangrene: Secondary | ICD-10-CM | POA: Diagnosis not present

## 2024-04-17 DIAGNOSIS — I10 Essential (primary) hypertension: Secondary | ICD-10-CM | POA: Diagnosis not present

## 2024-04-17 DIAGNOSIS — I639 Cerebral infarction, unspecified: Secondary | ICD-10-CM | POA: Diagnosis not present

## 2024-04-17 DIAGNOSIS — R569 Unspecified convulsions: Secondary | ICD-10-CM | POA: Diagnosis not present

## 2024-04-17 NOTE — Progress Notes (Signed)
 Patient has called 911 multiple times

## 2024-04-17 NOTE — Progress Notes (Signed)
 [facility]                                                                               [name], is a 58 y.o. male, DOB - 1966-11-24, BJY:[id number] Admit date - 03/04/2024    Outpatient Primary MD for the patient is Abraham Abo, MD  LOS - 44  days    Brief summary   58 year old with a history of cardiomyopathy and cocaine abuse who presented to the ER 03/04/2024 with slurred speech, confusion, and right-sided weakness. CT head noted a left MCA infarct with CTa of the head and neck revealing an M2 occlusion on the left. Echocardiogram revealed a large mobile left ventricular thrombus with left atrial thrombus as well as right ventricular apical thrombus and an EF of <20%. PT/OT have recommended SNF placement on discharge. The patient has been found to lack medical decision-making capacity such that he is not able to determine his discharge disposition nor is he allowed to sign out AMA.    Assessment & Plan    Assessment and Plan: * Acute CVA (cerebrovascular accident) (HCC) Has undergone extensive workup with CT revealing left MCA territory infarct affecting the insula, caudate nucleus, lentiform nucleus, and internal capsule - MRI confirmed these infarcts and also revealed evidence of multiple old cerebellar infarcts as well as findings of chronic small vessel ischemia -TTE noted a large mobile clot in the LV attached to the intraventricular septum just under the mitral valve as well as severely decreased global LV function with an EF of 20% as well as a small RV apical clot and severely reduced RV systolic function as well as a large and mobile clot in the left atrium -LDL 71 -A1c 5.8 -Continue Eliquis  for intracardiac thrombus - patient has been counseled on multiple occasions on need for very consistent Eliquis  compliance to avoid recurrent stroke and even possible death -though psychiatry has deemed him incompetent he is still very much able to physically resist  complying with care and there is no safe way to force medications on him but he refuses to take -as a result the nursing staff and the physician simply make multiple attempts throughout the day to convince the patient of the need to comply with medical therapy. Today he has refused to be examined, esp his ears as he reported fullness of the ears.  He continues to refuse the senokot.   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 Continue Eliquis  - no gross volume overload at present - continue Lasix , losartan , Coreg , Aldactone , and Crestor  - will need outpatient Cardiology follow-up   Schizoaffective disorder bipolar type -agitation -lack of capacity Psychiatry has assisted in the care of this patient during his hospitalization - if patient attempts to leave the hospital he should be placed under involuntary commitment -TOC has contacted APS to pursue guardianship as patient lacks decision-making capacity - Psychiatry is presently recommending we await APS decision on guardianship prior to attempting to discharge him. Patient continues to refuse medications.  He refused to be examined today.    Back pain Pain better controlled today.   HTN Optimal.     HLD Continue Crestor   DM2 CBG (last 3)  No results for input(s): "GLUCAP" in the last 72 hours. Well-controlled with A1c 5.8 -    Polysubstance abuse UDS positive for cocaine and benzodiazepines - has admitted to habitual use of crack cocaine   Deconditioning Ambulatory status complicated by previous right AKA and left BKA -patient has no insurance coverage for SNF   COPD (chronic obstructive pulmonary disease) (HCC) No wheezing heard.  Continue trelegy and SABA prn    Fullness in both ears  Dubrox ear drops ordered.     History of DVT (deep vein thrombosis) Resume eliquis .    Status post bilateral BKA. Chronic pain -continue the use of gabapentin , oxycodone  and muscle relaxants when  can safely swallow PRN IV morphine  for now      Estimated body mass index is 20.09 kg/m as calculated from the following:   Height as of this encounter: 5\' 9"  (1.753 m).   Weight as of this encounter: 61.7 kg.  Code Status:  full code.  DVT Prophylaxis:   apixaban  (ELIQUIS ) tablet 5 mg   Level of Care: Level of care: Med-Surg Family Communication: none at bedside.   Disposition Plan:     Remains inpatient appropriate:  placement.   Procedures:  None.   Consultants:   None.   Antimicrobials:   Anti-infectives (From admission, onward)    None        Medications  Scheduled Meds:  apixaban   5 mg Oral BID   carvedilol   6.25 mg Oral BID WC   furosemide   40 mg Oral Daily   gabapentin   200 mg Oral BID   losartan   50 mg Oral Daily   revefenacin   175 mcg Nebulization Daily   rosuvastatin   5 mg Oral QHS   senna-docusate  1 tablet Oral BID   spironolactone   12.5 mg Oral Daily   Continuous Infusions: PRN Meds:.albuterol , feeding supplement, oxyCODONE     Subjective:   Graylon Amory was seen and examined today.  Refused to talk to me.   Objective:   Vitals:   04/16/24 0910 04/16/24 1158 04/17/24 0909 04/17/24 1231  BP: 129/77   (!) 135/94  Pulse: 74   70  Resp: 19   17  Temp:    97.7 F (36.5 C)  TempSrc:    Oral  SpO2: 96% 98% 96% 97%  Weight:      Height:        Intake/Output Summary (Last 24 hours) at 04/17/2024 1704 Last data filed at 04/17/2024 0800 Gross per 24 hour  Intake 351 ml  Output 750 ml  Net -399 ml   Filed Weights   03/26/24 0431 03/28/24 0456 04/04/24 0500  Weight: 42.7 kg 47.5 kg 61.7 kg     Exam Patient appears comfortable.  He refused further exam.    Data Reviewed:  I have personally reviewed following labs and imaging studies   CBC Lab Results  Component Value Date   WBC 5.5 04/16/2024   RBC 4.92 04/16/2024   HGB 14.9 04/16/2024   HCT 44.4 04/16/2024   MCV 90.2 04/16/2024   MCH 30.3 04/16/2024   PLT 163  04/16/2024   MCHC 33.6 04/16/2024   RDW 14.6 04/16/2024   LYMPHSABS 2.7 04/16/2024   MONOABS 0.7 04/16/2024   EOSABS 0.1 04/16/2024   BASOSABS 0.0 04/16/2024     Last metabolic panel Lab Results  Component Value Date   NA 137 04/16/2024   K 4.2 04/16/2024   CL 104 04/16/2024   CO2 20 (  L) 04/16/2024   BUN 18 04/16/2024   CREATININE 0.91 04/16/2024   GLUCOSE 107 (H) 04/16/2024   GFRNONAA >60 04/16/2024   GFRAA >60 08/21/2020   CALCIUM  9.5 04/16/2024   PHOS 2.7 01/16/2024   PROT 7.0 03/10/2024   ALBUMIN 3.3 (L) 03/10/2024   BILITOT 1.6 (H) 03/10/2024   ALKPHOS 63 03/10/2024   AST 17 03/10/2024   ALT 30 03/10/2024   ANIONGAP 13 04/16/2024    CBG (last 3)  No results for input(s): "GLUCAP" in the last 72 hours.    Coagulation Profile: No results for input(s): "INR", "PROTIME" in the last 168 hours.   Radiology Studies: No results found.     Feliciana Horn M.D. [facility] 04/17/2024, 5:04 PM  Available via Epic secure chat 7am-7pm After 7 pm, please refer to night coverage provider listed on amion.

## 2024-04-17 NOTE — Progress Notes (Signed)
 Patient refused assessment and bed alarm.  Patient requesting oxycodone  however refused senokot, when RN inquired as to when was his last bowel movement, patient advised that he doesn't know, last BM charted over 24 hours ago, per oxycodone  order instructions, oxycodone  can not be given.

## 2024-04-18 DIAGNOSIS — I639 Cerebral infarction, unspecified: Secondary | ICD-10-CM | POA: Diagnosis not present

## 2024-04-18 DIAGNOSIS — E1151 Type 2 diabetes mellitus with diabetic peripheral angiopathy without gangrene: Secondary | ICD-10-CM | POA: Diagnosis not present

## 2024-04-18 DIAGNOSIS — I10 Essential (primary) hypertension: Secondary | ICD-10-CM | POA: Diagnosis not present

## 2024-04-18 DIAGNOSIS — R569 Unspecified convulsions: Secondary | ICD-10-CM | POA: Diagnosis not present

## 2024-04-18 MED ORDER — CARBAMIDE PEROXIDE 6.5 % OT SOLN
5.0000 [drp] | Freq: Two times a day (BID) | OTIC | Status: AC
  Administered 2024-04-19 – 2024-04-21 (×3): 5 [drp] via OTIC
  Filled 2024-04-18: qty 15

## 2024-04-18 MED ORDER — LACTULOSE 10 GM/15ML PO SOLN
20.0000 g | Freq: Two times a day (BID) | ORAL | Status: DC | PRN
Start: 2024-04-18 — End: 2024-08-17

## 2024-04-18 NOTE — Progress Notes (Signed)
 Physical Therapy Treatment Patient Details Name: [name] MRN: [id number] DOB: 01/13/1966 Today's Date: 04/18/2024   History of Present Illness Patient is 58 y.o. male presented to ED on 03/04/24 with stroke like symptoms when caregiver found him with slurred speech and right sided weakness. Head CT reveals Acute left MCA territory infarct affecting the insula, caudate nucleus, lentiform nucleus and internal capsule. PMH significant of  Schizoaffective disorder, heart failure with reduced ejection fraction, pretension, DMII, COPD, s/p bilateral amputations, chronic pain, and polysubstance abuse.    PT Comments  Continuing work on functional mobility and activity tolerance;  [name] was agreeable to OOB, and his transfers anterior posterior are notably smoother when backing in, bed to wheelchair; He seemed to want to get out of the room, and initially agreed to some UE threrex;While [name] wheeled in the hallway outside of his room, he voiced frustration with meds, situation; He seemed to request that he be seen by 1 therapist at a time (among PT, OT, ST), and we will try to accommodate this request    If plan is discharge home, recommend the following: A little help with walking and/or transfers;A little help with bathing/dressing/bathroom;Assistance with cooking/housework;Assist for transportation;Supervision due to cognitive status   Can travel by private vehicle      (perhaps soon)  Equipment Recommendations  None recommended by PT    Recommendations for Other Services OT consult;Speech consult     Precautions / Restrictions Precautions Precautions: Fall Recall of Precautions/Restrictions: Impaired Precaution/Restrictions Comments: h/o BLE amputations Restrictions Weight Bearing Restrictions Per Provider Order: No     Mobility  Bed Mobility Overal bed mobility: Modified Independent                  Transfers Overall transfer level: Needs assistance Equipment used:  None Transfers: Bed to chair/wheelchair/BSC         Anterior-Posterior transfers: Supervision   General transfer comment: Supervision, though without the need to cue for brakes use; smooth transition with backing into wheelchair; did not have the opportunity to work with [name] on getting back to bed from wheelchair    Ambulation/Gait                   Psychologist, counselling mobility: Yes Wheelchair propulsion: Both upper extremities Wheelchair parts:  (modified independent) Distance: 50 Wheelchair Assistance Details (indicate cue type and reason): No physical assist; Pt wanted to get back to room before we left the unit   Tilt Bed    Modified Rankin (Stroke Patients Only) Modified Rankin (Stroke Patients Only) Pre-Morbid Rankin Score: Slight disability Modified Rankin: Moderately severe disability     Balance     Sitting balance-Leahy Scale: Good                                      Communication Communication Communication: Impaired Factors Affecting Communication: Difficulty expressing self;Reduced clarity of speech  Cognition Arousal: Alert Behavior During Therapy: WFL for tasks assessed/performed   PT - Cognitive impairments: Difficult to assess Difficult to assess due to: Impaired communication                     PT - Cognition Comments: 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 Following commands: Intact      Cueing Cueing Techniques: Verbal cues, Tactile cues, Gestural cues  Exercises  General Comments General comments (skin integrity, edema, etc.): Spent time before session planning with ST re: getting a calendar to post in his room, and re: scheduling therapy sessions      Pertinent Vitals/Pain Pain Assessment Pain Assessment: Faces Faces Pain Scale: No hurt Pain Intervention(s): Monitored during session    Home Living                          Prior Function             PT Goals (current goals can now be found in the care plan section) Acute Rehab PT Goals Patient Stated Goal: would like to be able to get home PT Goal Formulation: With patient Time For Goal Achievement: 04/20/24 Potential to Achieve Goals: Fair Progress towards PT goals: Progressing toward goals (non-linearly)    Frequency    Min 2X/week      PT Plan      Co-evaluation              AM-PAC PT "6 Clicks" Mobility   Outcome Measure  Help needed turning from your back to your side while in a flat bed without using bedrails?: None Help needed moving from lying on your back to sitting on the side of a flat bed without using bedrails?: None Help needed moving to and from a bed to a chair (including a wheelchair)?: A Little Help needed standing up from a chair using your arms (e.g., wheelchair or bedside chair)?: Total Help needed to walk in hospital room?: Total Help needed climbing 3-5 steps with a railing? : Total 6 Click Score: 14    End of Session   Activity Tolerance: Patient tolerated treatment well;Other (comment) (with the exception of low frustration tolerance) Patient left: Other (comment) (with OT)   PT Visit Diagnosis: Other abnormalities of gait and mobility (R26.89);Muscle weakness (generalized) (M62.81);Other symptoms and signs involving the nervous system (R29.898);Hemiplegia and hemiparesis Hemiplegia - Right/Left: Right Hemiplegia - dominant/non-dominant: Dominant Hemiplegia - caused by: Cerebral infarction     Time: 1215-1230 PT Time Calculation (min) (ACUTE ONLY): 15 min  Charges:      PT General Charges $$ ACUTE PT VISIT: 1 Visit                     Darcus Eastern, PT  Acute Rehabilitation Services Office [phone] Secure Chat welcomed    Marcial Setting 04/18/2024, 3:44 PM

## 2024-04-18 NOTE — Plan of Care (Signed)
  Problem: Education: Goal: Knowledge of disease or condition will improve Outcome: Not Progressing Goal: Knowledge of secondary prevention will improve (MUST DOCUMENT ALL) Outcome: Not Progressing Goal: Knowledge of patient specific risk factors will improve (DELETE if not current risk factor) Outcome: Not Progressing   Problem: Ischemic Stroke/TIA Tissue Perfusion: Goal: Complications of ischemic stroke/TIA will be minimized Outcome: Not Progressing   Problem: Coping: Goal: Will verbalize positive feelings about self Outcome: Not Progressing Goal: Will identify appropriate support needs Outcome: Not Progressing   Problem: Health Behavior/Discharge Planning: Goal: Ability to manage health-related needs will improve Outcome: Not Progressing Goal: Goals will be collaboratively established with patient/family Outcome: Not Progressing   Problem: Self-Care: Goal: Ability to participate in self-care as condition permits will improve Outcome: Not Progressing Goal: Verbalization of feelings and concerns over difficulty with self-care will improve Outcome: Not Progressing Goal: Ability to communicate needs accurately will improve Outcome: Not Progressing   Problem: Nutrition: Goal: Risk of aspiration will decrease Outcome: Not Progressing Goal: Dietary intake will improve Outcome: Not Progressing

## 2024-04-18 NOTE — Progress Notes (Signed)
 Speech Language Pathology Treatment: Cognitive-Linquistic  Patient Details Name: [name] MRN: [id number] DOB: Sep 25, 1966 Today's Date: 04/18/2024 Time: 5643-3295 SLP Time Calculation (min) (ACUTE ONLY): 15 min  Assessment / Plan / Recommendation Clinical Impression  Pt was upset at the beginning of the session after RN had attempted med pass, so he was not particularly engaged with SLP. SLP introduced a weekly calendar to keep track of what he wants to do, what therapy he has planned, and what he may need medically. Pt did initially report what he wanted to do for the day (call Erin), and SLP demonstrated use of calendar. He then changed his mind and said not to call her though. SLP hung calendar up on the wall where pt said he would be able to see it (will need to confirm that he truly can see/use it from his bed, but can be pulled off one day at a time if needed for vision). PT/OT were added to the calendar for today with verbal reminder as well. SLP wrote plan to return and will f/u on subsequent visit to see how this may assist with recall. Would encourage other providers to use it and refer to it as well, for improved reinforcement.    HPI HPI: Patient is 58 y.o. male presented to ED on 03/04/24 with stroke like symptoms when caregiver found him with slurred speech and right sided weakness. Head CT reveals Acute left MCA territory infarct affecting the insula, caudate nucleus, lentiform nucleus and internal capsule. Pt initially evaluated by SLP 4/5 but s/o 4/22 due to repeated refusals. SLP reordered 5/13 for repeat cognitive-linguistic eval.   PMH significant of  Schizoaffective disorder, heart failure with reduced ejection fraction, pretension, DMII, COPD, s/p bilateral amputations, chronic pain, and polysubstance abuse.      SLP Plan  Continue with current plan of care      Recommendations for follow up therapy are one component of a multi-disciplinary discharge planning process, led  by the attending physician.  Recommendations may be updated based on patient status, additional functional criteria and insurance authorization.    Recommendations                         Frequent or constant Supervision/Assistance Dysarthria and anarthria (R47.1);Aphasia (R47.01)     Continue with current plan of care     Beth Brooke., M.A. CCC-SLP Acute Rehabilitation Services Office: [phone]  Secure chat preferred   04/18/2024, 12:32 PM

## 2024-04-18 NOTE — Progress Notes (Signed)
 Patient refused all medications. Explained each medication, explained patient orders state needs to take senna to receive oxycodone  and patient stated "take them all back", " I have told you all what I won't take" and "I don't care what the doctor says" MD notified.

## 2024-04-18 NOTE — Progress Notes (Signed)
 Occupational Therapy Treatment Patient Details Name: [name] MRN: [id number] DOB: 1966/11/20 Today's Date: 04/18/2024   History of present illness Patient is 58 y.o. male presented to ED on 03/04/24 with stroke like symptoms when caregiver found him with slurred speech and right sided weakness. Head CT reveals Acute left MCA territory infarct affecting the insula, caudate nucleus, lentiform nucleus and internal capsule. PMH significant of  Schizoaffective disorder, heart failure with reduced ejection fraction, pretension, DMII, COPD, s/p bilateral amputations, chronic pain, and polysubstance abuse.   OT comments  Pt progressing slowly toward established OT goals. Performing transfers with as little as supervision assist and with good wheelchair mobility progression, but continuing to note decreased length of propulsion with RUE as compared to LUE; attempted to cue pt but minimally receptive. Attempted to engage pt in UE there-ex and pt takes 3Lb bar bell from OT, but does not engage in exercise with BUE. Pt seems frustrated throughout reporting it has been a hard day and seemingly requests PT/OT/ST see separately for future sessions, so will attempt this method, although pt historically does better with familiar PT present. Will continue to follow.       If plan is discharge home, recommend the following:  A little help with walking and/or transfers;A little help with bathing/dressing/bathroom;Supervision due to cognitive status   Equipment Recommendations  None recommended by OT    Recommendations for Other Services      Precautions / Restrictions Precautions Precautions: Fall Recall of Precautions/Restrictions: Impaired Precaution/Restrictions Comments: h/o BLE amputations Restrictions Weight Bearing Restrictions Per Provider Order: No       Mobility Bed Mobility Overal bed mobility: Modified Independent                  Transfers Overall transfer level: Needs  assistance Equipment used: None Transfers: Bed to chair/wheelchair/BSC         Anterior-Posterior transfers: Supervision   General transfer comment: Supervision, though without the need to cue for brakes use; smooth transition with backing into wheelchair; did not have the opportunity to work with [name] on getting back to bed from wheelchair     Balance Overall balance assessment: Needs assistance Sitting-balance support: Bilateral upper extremity supported Sitting balance-Leahy Scale: Good                                     ADL either performed or assessed with clinical judgement   ADL Overall ADL's : Needs assistance/impaired                         Toilet Transfer: Supervision/safety;Anterior/posterior           Functional mobility during ADLs: Supervision/safety;Wheelchair      Extremity/Trunk Assessment Upper Extremity Assessment Upper Extremity Assessment: Right hand dominant;RUE deficits/detail RUE Deficits / Details: decreased proprioception, neuromuscular endurance, and strength noted when attempting to propel wheelchair. RUE Coordination: decreased fine motor;decreased gross motor   Lower Extremity Assessment Lower Extremity Assessment: Defer to PT evaluation        Vision   Vision Assessment?: Vision impaired- to be further tested in functional context Additional Comments: deferred formalized testing   Perception Perception Perception: Not tested (questionably decr attention to R side of body)   Praxis Praxis Praxis: Not tested   Communication Communication Communication: Impaired Factors Affecting Communication: Difficulty expressing self;Reduced clarity of speech   Cognition Arousal: Alert Behavior During Therapy: [facility]  for tasks assessed/performed Cognition: Cognition impaired Difficult to assess due to: Impaired communication   Awareness: Online awareness impaired, Intellectual awareness intact Memory impairment  (select all impairments): Short-term memory, Working memory Attention impairment (select first level of impairment): Selective attention Executive functioning impairment (select all impairments): Reasoning, Problem solving, Organization, Sequencing OT - Cognition Comments: Pt aware of deficits in RUE, and initially seems agreeable to work on it but when asked to engage, pt defers today. Pt frustrated this session by earlier experiences with nursing staff today and appears to be asking for OT/SLP/PT to see him all separately, and we have agreed to attempt to do this. pt benefits from cues for memory recall and becomes frustrated with self with word finding but does do relatively well with self implementation of coping strategies such as deep breathing                 Following commands: Intact, Impaired Following commands impaired:  (selectively follows one step commands)      Cueing   Cueing Techniques: Verbal cues, Tactile cues, Gestural cues  Exercises      Shoulder Instructions       General Comments Spent time before session planning with ST re: getting a calendar to post in his room, and re: scheduling therapy sessions    Pertinent Vitals/ Pain       Pain Assessment Pain Assessment: Faces Faces Pain Scale: No hurt Pain Intervention(s): Monitored during session  Home Living                                          Prior Functioning/Environment              Frequency  Min 1X/week        Progress Toward Goals  OT Goals(current goals can now be found in the care plan section)  Progress towards OT goals: Progressing toward goals  Acute Rehab OT Goals Patient Stated Goal: get better OT Goal Formulation: With patient Time For Goal Achievement: 04/27/24 Potential to Achieve Goals: Fair ADL Goals Pt Will Perform Lower Body Bathing: with mod assist;sitting/lateral leans Pt Will Perform Lower Body Dressing: with modified independence;bed  level Pt Will Transfer to Toilet: with mod assist;with transfer board;bedside commode Pt Will Perform Toileting - Clothing Manipulation and hygiene: with mod assist;sitting/lateral leans Pt/caregiver will Perform Home Exercise Program: Right Upper extremity;Increased strength Additional ADL Goal #1: Pt will transfer to w/c with Mod I as a precursor for OOB ADLs Additional ADL Goal #2: Patient will be able to sit EOB with mod A for 5 minutes as a precursor to OOB activities. Additional ADL Goal #3: Pt will demonstrate proper safety with transfers/mobility 100% of the time during session.  Plan      Co-evaluation                 AM-PAC OT "6 Clicks" Daily Activity     Outcome Measure   Help from another person eating meals?: None Help from another person taking care of personal grooming?: A Little Help from another person toileting, which includes using toliet, bedpan, or urinal?: A Lot Help from another person bathing (including washing, rinsing, drying)?: A Little Help from another person to put on and taking off regular upper body clothing?: A Little Help from another person to put on and taking off regular lower body clothing?: A Little 6 Click Score:  18    End of Session Equipment Utilized During Treatment: Other (comment) (wheelchair)  OT Visit Diagnosis: Unsteadiness on feet (R26.81);Other abnormalities of gait and mobility (R26.89);Muscle weakness (generalized) (M62.81);Other symptoms and signs involving the nervous system (R29.898);Other symptoms and signs involving cognitive function;Hemiplegia and hemiparesis Hemiplegia - Right/Left: Right Hemiplegia - dominant/non-dominant: Dominant Hemiplegia - caused by: Cerebral infarction   Activity Tolerance Patient tolerated treatment well   Patient Left in bed;with call bell/phone within reach;with bed alarm set   Nurse Communication Mobility status        Time: 9563-8756 OT Time Calculation (min): 20 min  Charges:  OT General Charges $OT Visit: 1 Visit OT Treatments $Therapeutic Activity: 8-22 mins  Karilyn Ouch, OTR/L [facility] Acute Rehabilitation Office: [phone]   [name] 04/18/2024, 3:57 PM

## 2024-04-18 NOTE — Progress Notes (Signed)
 Patient refused assessment and refused coreg , stated will take with his other medications later

## 2024-04-18 NOTE — Progress Notes (Addendum)
 Patient refused assessment. Will reattempt later. Documentation reflects only what could be observed without full patient assessment.

## 2024-04-18 NOTE — Progress Notes (Signed)
 [facility]                                                                               [name], is a 58 y.o. male, DOB - 09/29/1966, WUJ:[id number] Admit date - 03/04/2024    Outpatient Primary MD for the patient is Abraham Abo, MD  LOS - 45  days    Brief summary   58 year old with a history of cardiomyopathy and cocaine abuse who presented to the ER 03/04/2024 with slurred speech, confusion, and right-sided weakness. CT head noted a left MCA infarct with CTa of the head and neck revealing an M2 occlusion on the left. Echocardiogram revealed a large mobile left ventricular thrombus with left atrial thrombus as well as right ventricular apical thrombus and an EF of <20%. PT/OT have recommended SNF placement on discharge. The patient has been found to lack medical decision-making capacity such that he is not able to determine his discharge disposition nor is he allowed to sign out AMA.    Assessment & Plan    Assessment and Plan: * Acute CVA (cerebrovascular accident) (HCC) Has undergone extensive workup with CT revealing left MCA territory infarct affecting the insula, caudate nucleus, lentiform nucleus, and internal capsule - MRI confirmed these infarcts and also revealed evidence of multiple old cerebellar infarcts as well as findings of chronic small vessel ischemia -TTE noted a large mobile clot in the LV attached to the intraventricular septum just under the mitral valve as well as severely decreased global LV function with an EF of 20% as well as a small RV apical clot and severely reduced RV systolic function as well as a large and mobile clot in the left atrium -LDL 71 -A1c 5.8 -Continue Eliquis  for intracardiac thrombus - patient has been counseled on multiple occasions on need for very consistent Eliquis  compliance to avoid recurrent stroke and even possible death -though psychiatry has deemed him incompetent he is still very much able to physically resist  complying with care and there is no safe way to force medications on him but he refuses to take -as a result the nursing staff and the physician simply make multiple attempts throughout the day to convince the patient of the need to comply with medical therapy.  He is adamant about not taking the senokot.    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 Continue Eliquis  - no gross volume overload at present - continue Lasix , losartan , Coreg , Aldactone , and Crestor  - will need outpatient Cardiology follow-up   Schizoaffective disorder bipolar type -agitation -lack of capacity Psychiatry has assisted in the care of this patient during his hospitalization - if patient attempts to leave the hospital he should be placed under involuntary commitment -TOC has contacted APS to pursue guardianship as patient lacks decision-making capacity - Psychiatry is presently recommending we await APS decision on guardianship prior to attempting to discharge him. Patient worked with PT today.    Back pain Pain controlled.   HTN Well controlled.     HLD Continue Crestor    DM2 CBG (last 3)  No results for input(s): "GLUCAP" in the last 72 hours. Well-controlled with A1c 5.8 -  Polysubstance abuse UDS positive for cocaine and benzodiazepines - has admitted to habitual use of crack cocaine   Deconditioning Ambulatory status complicated by previous right AKA and left BKA -patient has no insurance coverage for SNF   COPD (chronic obstructive pulmonary disease) (HCC) No wheezing heard.  Continue trelegy and SABA prn    Fullness in both ears  Dubrox ear drops ordered and he reports improvement.     History of DVT (deep vein thrombosis) Resume eliquis .    Status post bilateral BKA. Chronic pain -continue the use of gabapentin , oxycodone  and muscle relaxants when can safely swallow PRN IV morphine  for now      Estimated body mass index is 20.09 kg/m as  calculated from the following:   Height as of this encounter: 5\' 9"  (1.753 m).   Weight as of this encounter: 61.7 kg.  Code Status:  full code.  DVT Prophylaxis:   apixaban  (ELIQUIS ) tablet 5 mg   Level of Care: Level of care: Med-Surg Family Communication: none at bedside.   Disposition Plan:     Remains inpatient appropriate:  placement.   Procedures:  None.   Consultants:   None.   Antimicrobials:   Anti-infectives (From admission, onward)    None        Medications  Scheduled Meds:  apixaban   5 mg Oral BID   carvedilol   6.25 mg Oral BID WC   furosemide   40 mg Oral Daily   gabapentin   200 mg Oral BID   losartan   50 mg Oral Daily   revefenacin   175 mcg Nebulization Daily   rosuvastatin   5 mg Oral QHS   senna-docusate  1 tablet Oral BID   spironolactone   12.5 mg Oral Daily   Continuous Infusions: PRN Meds:.albuterol , feeding supplement, oxyCODONE     Subjective:   [name] was seen and examined today.  No chest pain or sob.   Objective:   Vitals:   04/17/24 1231 04/17/24 2026 04/18/24 0745 04/18/24 0812  BP: (!) 135/94 134/67 (!) 160/140   Pulse: 70 62 64   Resp: 17 18 19    Temp: 97.7 F (36.5 C) 97.6 F (36.4 C) 97.7 F (36.5 C)   TempSrc: Oral     SpO2: 97% 94% 97% 96%  Weight:      Height:        Intake/Output Summary (Last 24 hours) at 04/18/2024 1742 Last data filed at 04/17/2024 2239 Gross per 24 hour  Intake --  Output 650 ml  Net -650 ml   Filed Weights   03/26/24 0431 03/28/24 0456 04/04/24 0500  Weight: 42.7 kg 47.5 kg 61.7 kg     Exam General exam: Appears calm and comfortable  Respiratory system: Clear to auscultation. Respiratory effort normal. Cardiovascular system: S1 & S2 heard, RRR. No JVD Gastrointestinal system: Abdomen is nondistended, soft and nontender.  Extremities: Warm.   Data Reviewed:  I have personally reviewed following labs and imaging studies   CBC Lab Results  Component Value Date   WBC  5.5 04/16/2024   RBC 4.92 04/16/2024   HGB 14.9 04/16/2024   HCT 44.4 04/16/2024   MCV 90.2 04/16/2024   MCH 30.3 04/16/2024   PLT 163 04/16/2024   MCHC 33.6 04/16/2024   RDW 14.6 04/16/2024   LYMPHSABS 2.7 04/16/2024   MONOABS 0.7 04/16/2024   EOSABS 0.1 04/16/2024   BASOSABS 0.0 04/16/2024     Last metabolic panel Lab Results  Component Value Date   NA 137 04/16/2024  K 4.2 04/16/2024   CL 104 04/16/2024   CO2 20 (L) 04/16/2024   BUN 18 04/16/2024   CREATININE 0.91 04/16/2024   GLUCOSE 107 (H) 04/16/2024   GFRNONAA >60 04/16/2024   GFRAA >60 08/21/2020   CALCIUM  9.5 04/16/2024   PHOS 2.7 01/16/2024   PROT 7.0 03/10/2024   ALBUMIN 3.3 (L) 03/10/2024   BILITOT 1.6 (H) 03/10/2024   ALKPHOS 63 03/10/2024   AST 17 03/10/2024   ALT 30 03/10/2024   ANIONGAP 13 04/16/2024    CBG (last 3)  No results for input(s): "GLUCAP" in the last 72 hours.    Coagulation Profile: No results for input(s): "INR", "PROTIME" in the last 168 hours.   Radiology Studies: No results found.     Feliciana Horn M.D. [facility] 04/18/2024, 5:42 PM  Available via Epic secure chat 7am-7pm After 7 pm, please refer to night coverage provider listed on amion.

## 2024-04-19 DIAGNOSIS — I639 Cerebral infarction, unspecified: Secondary | ICD-10-CM | POA: Diagnosis not present

## 2024-04-19 DIAGNOSIS — R569 Unspecified convulsions: Secondary | ICD-10-CM | POA: Diagnosis not present

## 2024-04-19 DIAGNOSIS — I10 Essential (primary) hypertension: Secondary | ICD-10-CM | POA: Diagnosis not present

## 2024-04-19 DIAGNOSIS — E1151 Type 2 diabetes mellitus with diabetic peripheral angiopathy without gangrene: Secondary | ICD-10-CM | POA: Diagnosis not present

## 2024-04-19 NOTE — Progress Notes (Signed)
 [facility]                                                                               [name], is a 58 y.o. male, DOB - 05-13-1966, [id number] Admit date - 03/04/2024    Outpatient Primary MD for the patient is Abraham Abo, MD  LOS - 46  days    Brief summary   58 year old with a history of cardiomyopathy and cocaine abuse who presented to the ER 03/04/2024 with slurred speech, confusion, and right-sided weakness. CT head noted a left MCA infarct with CTa of the head and neck revealing an M2 occlusion on the left. Echocardiogram revealed a large mobile left ventricular thrombus with left atrial thrombus as well as right ventricular apical thrombus and an EF of <20%. PT/OT have recommended SNF placement on discharge. The patient has been found to lack medical decision-making capacity such that he is not able to determine his discharge disposition nor is he allowed to sign out AMA.    Assessment & Plan    Assessment and Plan: * Acute CVA (cerebrovascular accident) (HCC) Has undergone extensive workup with CT revealing left MCA territory infarct affecting the insula, caudate nucleus, lentiform nucleus, and internal capsule - MRI confirmed these infarcts and also revealed evidence of multiple old cerebellar infarcts as well as findings of chronic small vessel ischemia -TTE noted a large mobile clot in the LV attached to the intraventricular septum just under the mitral valve as well as severely decreased global LV function with an EF of 20% as well as a small RV apical clot and severely reduced RV systolic function as well as a large and mobile clot in the left atrium -LDL 71 -A1c 5.8 -Continue Eliquis  for intracardiac thrombus - patient has been counseled on multiple occasions on need for very consistent Eliquis  compliance to avoid recurrent stroke and even possible death -though psychiatry has deemed him incompetent he is still very much able to physically resist  complying with care and there is no safe way to force medications on him but he refuses to take -as a result the nursing staff and the physician simply make multiple attempts throughout the day to convince the patient of the need to comply with medical therapy.  He is adamant about not taking the senokot. I added lactulose . He refused to answer any questions.    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  Patient denies any chest pain or sob.    Schizoaffective disorder bipolar type -agitation -lack of capacity Psychiatry has assisted in the care of this patient during his hospitalization - if patient attempts to leave the hospital he should be placed under involuntary commitment -TOC has contacted APS to pursue guardianship as patient lacks decision-making capacity - Psychiatry is presently recommending we await APS decision on guardianship prior to attempting to discharge him. Patient working with PT but refused to be examined .    Back pain Pain controlled.   HTN Pt refused meds. BP elevated.     HLD  Continue Crestor    DM2 CBG (last 3)  No results for input(s): "GLUCAP" in the last 72 hours. Well-controlled with A1c 5.8 -    Polysubstance abuse UDS positive for cocaine and benzodiazepines - has admitted to habitual use of crack cocaine   Deconditioning Ambulatory status complicated by previous right AKA and left BKA -patient has no insurance coverage for SNF   COPD (chronic obstructive pulmonary disease) (HCC) No wheezing heard.  Continue trelegy and SABA prn    Fullness in both ears  Dubrox ear drops ordered and he reports improvement.     History of DVT (deep vein thrombosis) Resume eliquis .    Status post bilateral BKA. Chronic pain -continue the use of gabapentin ,  oxycodone  and muscle relaxants when can safely swallow PRN IV morphine  for now      Estimated body mass index is 20.09 kg/m as calculated from the following:   Height as of this encounter: 5\' 9"  (1.753 m).   Weight as of this encounter: 61.7 kg.  Code Status:  full code.  DVT Prophylaxis:   apixaban  (ELIQUIS ) tablet 5 mg   Level of Care: Level of care: Med-Surg Family Communication: none at bedside.   Disposition Plan:     Remains inpatient appropriate:  placement.   Procedures:  None.   Consultants:   None.   Antimicrobials:   Anti-infectives (From admission, onward)    None        Medications  Scheduled Meds:  apixaban   5 mg Oral BID   carbamide peroxide  5 drop Both EARS BID   carvedilol   6.25 mg Oral BID WC   furosemide   40 mg Oral Daily   gabapentin   200 mg Oral BID   losartan   50 mg Oral Daily   revefenacin   175 mcg Nebulization Daily   rosuvastatin   5 mg Oral QHS   senna-docusate  1 tablet Oral BID   spironolactone   12.5 mg Oral Daily   Continuous Infusions: PRN Meds:.albuterol , feeding supplement, lactulose , oxyCODONE     Subjective:   Syaire Saber was seen and examined today.  No new complaints. No chest pain.   Objective:   Vitals:   04/18/24 0812 04/18/24 2005 04/19/24 0824 04/19/24 0907  BP:  (!) 179/102 (!) 147/109   Pulse:  73 89   Resp:  18 16   Temp:   97.9 F (36.6 C)   TempSrc:   Oral   SpO2: 96% 99% 100% 98%  Weight:      Height:       No intake or output data in the 24 hours ending 04/19/24 1719  Filed Weights   03/26/24 0431 03/28/24 0456 04/04/24 0500  Weight: 42.7 kg 47.5 kg 61.7 kg     Exam Pt refused meds.   Data Reviewed:  I have personally reviewed following labs and imaging studies   CBC Lab Results  Component Value Date   WBC 5.5 04/16/2024   RBC 4.92 04/16/2024   HGB 14.9 04/16/2024   HCT 44.4 04/16/2024   MCV 90.2 04/16/2024   MCH 30.3 04/16/2024   PLT 163 04/16/2024   MCHC 33.6 04/16/2024    RDW 14.6 04/16/2024   LYMPHSABS 2.7 04/16/2024   MONOABS 0.7 04/16/2024   EOSABS 0.1 04/16/2024   BASOSABS 0.0 04/16/2024     Last metabolic panel Lab Results  Component Value Date   NA 137 04/16/2024   K 4.2 04/16/2024   CL 104 04/16/2024   CO2 20 (L) 04/16/2024   BUN  18 04/16/2024   CREATININE 0.91 04/16/2024   GLUCOSE 107 (H) 04/16/2024   GFRNONAA >60 04/16/2024   GFRAA >60 08/21/2020   CALCIUM  9.5 04/16/2024   PHOS 2.7 01/16/2024   PROT 7.0 03/10/2024   ALBUMIN 3.3 (L) 03/10/2024   BILITOT 1.6 (H) 03/10/2024   ALKPHOS 63 03/10/2024   AST 17 03/10/2024   ALT 30 03/10/2024   ANIONGAP 13 04/16/2024    CBG (last 3)  No results for input(s): "GLUCAP" in the last 72 hours.    Coagulation Profile: No results for input(s): "INR", "PROTIME" in the last 168 hours.   Radiology Studies: No results found.     Feliciana Horn M.D. [facility] 04/19/2024, 5:19 PM  Available via Epic secure chat 7am-7pm After 7 pm, please refer to night coverage provider listed on amion.

## 2024-04-19 NOTE — Progress Notes (Signed)
 RN attempted to administered scheduled medication per MD request. RN educated patient on each med and the importance towards stroke and blood pressure management. Patient relayed to RN "keep it all". RN returned meds to pyxis and notified Akula MD.

## 2024-04-19 NOTE — Progress Notes (Signed)
 Speech Language Pathology Treatment: Cognitive-Linquistic  Patient Details Name: [name] MRN: [id number] DOB: Jun 15, 1966 Today's Date: 04/19/2024 Time: 1223-1252 SLP Time Calculation (min) (ACUTE ONLY): 29 min  Assessment / Plan / Recommendation Clinical Impression  Pt had improved participation in therapy today, working with SLP in a more structured environment/at a desk. Upon initial arrival he was working with PT to create a tentative therapy schedule so he can anticipate visits. He was able to communicate what he wanted with Min A and use of written/visual aid. Pt then worked on divergent naming task with SLP. He was able to name four items in one category. Suspect he could have done more, but he was also a little tangential, talking about wanting to speak with a Child psychotherapist and concerned that he has not worked with one this admission (social work has been following per chart). SLP provided Min A during supported conversation based on topic of pt's preference before returning pt to room, where he assisted himself back to bed. Pt agreeable to SLP returning next week.    HPI HPI: Patient is 58 y.o. male presented to ED on 03/04/24 with stroke like symptoms when caregiver found him with slurred speech and right sided weakness. Head CT reveals Acute left MCA territory infarct affecting the insula, caudate nucleus, lentiform nucleus and internal capsule. Pt initially evaluated by SLP 4/5 but s/o 4/22 due to repeated refusals. SLP reordered 5/13 for repeat cognitive-linguistic eval.   PMH significant of  Schizoaffective disorder, heart failure with reduced ejection fraction, pretension, DMII, COPD, s/p bilateral amputations, chronic pain, and polysubstance abuse.      SLP Plan  Continue with current plan of care      Recommendations for follow up therapy are one component of a multi-disciplinary discharge planning process, led by the attending physician.  Recommendations may be updated based  on patient status, additional functional criteria and insurance authorization.    Recommendations                         Frequent or constant Supervision/Assistance Dysarthria and anarthria (R47.1);Aphasia (R47.01)     Continue with current plan of care     Beth Brooke., M.A. CCC-SLP Acute Rehabilitation Services Office: [phone]  Secure chat preferred   04/19/2024, 1:45 PM

## 2024-04-19 NOTE — Progress Notes (Signed)
 RN attempted to re-adminstered scheduled meds and obtain a set of VS. Patient adamantly refused. RN provided education of thrombus. Patient still adamantly refused all meds and assessment.

## 2024-04-19 NOTE — Progress Notes (Signed)
 Physical Therapy Treatment Patient Details Name: [name] MRN: [id number] DOB: Mar 26, 1966 Today's Date: 04/19/2024   History of Present Illness Patient is 58 y.o. male presented to ED on 03/04/24 with stroke like symptoms when caregiver found him with slurred speech and right sided weakness. Head CT reveals Acute left MCA territory infarct affecting the insula, caudate nucleus, lentiform nucleus and internal capsule. PMH significant of  Schizoaffective disorder, heart failure with reduced ejection fraction, pretension, DMII, COPD, s/p bilateral amputations, chronic pain, and polysubstance abuse.    PT Comments  Continuing work on functional mobility and activity tolerance; used a different wheelchair today because the wheelchair we've been using, while it fits the patient better, does not have removable armrests, which are more needed to take a close look at [name]'s lateral scoot transfers from wheelchair back to bed; The wheelchair we used was bariatric, so much more difficult to propel with both arms, but it did have removable, arm rests; in an effort to give [name] more agency and decision-making, let him choose how to get out of bed to the wheelchair, and he chose to perform an anterior posterior transfer, which he did with relative ease;  We wheeled in the hallways to a unit [name] is familiar with, and visited a few staff members he had known from previous admissions; [name] did agree to some right upper extremity work and strengthening, so along the way, he used his right upper extremity to propel the right side and this PT helped propel the left side of the wheelchair; Ended PT session in the [facility], where Maze started work with Speech Therapy;   Briefly spoke with E. Hildy Lowers, Chaplain about Quincey's case, and that I believe Elizeo will benefit from engaging with someone who can approach with the only intention to listen to him; will plan to consult for Spiritual Care and Counseling.   If  plan is discharge home, recommend the following: A little help with walking and/or transfers;A little help with bathing/dressing/bathroom;Assistance with cooking/housework;Assist for transportation;Supervision due to cognitive status   Can travel by private vehicle      (perhaps soon)  Equipment Recommendations  None recommended by PT    Recommendations for Other Services       Precautions / Restrictions Precautions Precautions: Fall Recall of Precautions/Restrictions: Impaired Precaution/Restrictions Comments: h/o BLE amputations     Mobility  Bed Mobility Overal bed mobility: Modified Independent                  Transfers Overall transfer level: Needs assistance Equipment used: None Transfers: Bed to chair/wheelchair/BSC         Anterior-Posterior transfers: Supervision   General transfer comment: Supervision, though without the need to cue for brakes use; smooth transition with backing into wheelchair; did not have the opportunity to work with [name] on getting back to bed from wheelchair    Ambulation/Gait                   Psychologist, counselling mobility: Yes Wheelchair propulsion: Right upper extremity Wheelchair parts: Supervision/cueing Distance: 100 Wheelchair Assistance Details (indicate cue type and reason): Used Bariatric WC today, with Miller sitting on R side of seat for R arm to have better access to wheel; used RUE WC porpulsion as an exercise for RUE movement/strengthening, with this PT pushing at L side of wc for straight forward motion   Tilt Bed  Modified Rankin (Stroke Patients Only) Modified Rankin (Stroke Patients Only) Pre-Morbid Rankin Score: Slight disability Modified Rankin: Moderately severe disability     Balance     Sitting balance-Leahy Scale: Good                                      Communication Communication Communication:  Impaired Factors Affecting Communication: Difficulty expressing self;Reduced clarity of speech  Cognition Arousal: Alert Behavior During Therapy: WFL for tasks assessed/performed   PT - Cognitive impairments: Difficult to assess Difficult to assess due to: Impaired communication                     PT - Cognition Comments: 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; see also SLP session note from today Following commands: Intact, Impaired Following commands impaired:  (selectively follows one step commands)    Cueing Cueing Techniques: Verbal cues, Tactile cues, Gestural cues  Exercises Other Exercises Other Exercises: Using RUE to propel wheelchair 150 feet, with a few rest breaks    General Comments General comments (skin integrity, edema, etc.): End of session, set up a tentative schedule with [name] for PT, OT, Speech; ended session with [name] working with SLP      Pertinent Vitals/Pain Pain Assessment Pain Assessment: Faces Faces Pain Scale: No hurt Pain Intervention(s): Monitored during session    Home Living                          Prior Function            PT Goals (current goals can now be found in the care plan section) Acute Rehab PT Goals Patient Stated Goal: would like to be able to get home PT Goal Formulation: With patient Time For Goal Achievement: 04/20/24 Potential to Achieve Goals: Fair Progress towards PT goals: Progressing toward goals    Frequency    Min 2X/week      PT Plan      Co-evaluation              AM-PAC PT "6 Clicks" Mobility   Outcome Measure  Help needed turning from your back to your side while in a flat bed without using bedrails?: None Help needed moving from lying on your back to sitting on the side of a flat bed without using bedrails?: None Help needed moving to and from a bed to a chair (including a wheelchair)?: A Little Help needed standing up from a chair using your arms (e.g., wheelchair or bedside chair)?:  Total Help needed to walk in hospital room?: Total Help needed climbing 3-5 steps with a railing? : Total 6 Click Score: 14    End of Session   Activity Tolerance: Patient tolerated treatment well Patient left: Other (comment) (with SLP) Nurse Communication: Mobility status PT Visit Diagnosis: Other abnormalities of gait and mobility (R26.89);Muscle weakness (generalized) (M62.81);Other symptoms and signs involving the nervous system (R29.898);Hemiplegia and hemiparesis Hemiplegia - Right/Left: Right Hemiplegia - dominant/non-dominant: Dominant Hemiplegia - caused by: Cerebral infarction     Time: 7829-5621 PT Time Calculation (min) (ACUTE ONLY): 42 min  Charges:    $Therapeutic Activity: 23-37 mins PT General Charges $$ ACUTE PT VISIT: 1 Visit                     Darcus Eastern, PT  Acute Rehabilitation Services Office [phone] Secure Chat welcomed  Marcial Setting 04/19/2024, 2:20 PM

## 2024-04-19 NOTE — Plan of Care (Signed)
  Problem: Self-Care: Goal: Ability to participate in self-care as condition permits will improve Outcome: Progressing   Problem: Nutrition: Goal: Risk of aspiration will decrease Outcome: Progressing Goal: Dietary intake will improve Outcome: Progressing   Problem: Education: Goal: Knowledge of disease or condition will improve Outcome: Not Progressing   Problem: Ischemic Stroke/TIA Tissue Perfusion: Goal: Complications of ischemic stroke/TIA will be minimized Outcome: Not Progressing   Patient isn't progressing in the stroke care plan due to noncompliance with medication regimen. Patient has been educated yet refused medications to maintain blood pressure and post stroke care.

## 2024-04-19 NOTE — Progress Notes (Signed)
 RN rounded on patient to perform shift assessment and provide scheduled morning medicine. Patient refused RN to perform assessment and patient refused all medications. RN educated patient on importance of medication to control blood pressure. Patient nodded and refused again. RN verbalize understanding. Nichole Barker MD notified of refusal.

## 2024-04-20 DIAGNOSIS — I639 Cerebral infarction, unspecified: Secondary | ICD-10-CM | POA: Diagnosis not present

## 2024-04-20 DIAGNOSIS — R569 Unspecified convulsions: Secondary | ICD-10-CM | POA: Diagnosis not present

## 2024-04-20 DIAGNOSIS — E1151 Type 2 diabetes mellitus with diabetic peripheral angiopathy without gangrene: Secondary | ICD-10-CM | POA: Diagnosis not present

## 2024-04-20 DIAGNOSIS — I10 Essential (primary) hypertension: Secondary | ICD-10-CM | POA: Diagnosis not present

## 2024-04-20 NOTE — Plan of Care (Signed)
  Problem: Coping: Goal: Will verbalize positive feelings about self Outcome: Progressing   Problem: Nutrition: Goal: Risk of aspiration will decrease Outcome: Progressing   

## 2024-04-20 NOTE — Progress Notes (Signed)
 Patient would like to speak to his [name] from [employer] about a "hearing". RN notified 3W [name] and made aware. [name] to round and speak to patient to provide information,

## 2024-04-20 NOTE — Progress Notes (Signed)
 Chaplain met with pt while rounding. Pt expressed his frustration with hospital experience, and requested to see a different doctor - specifically one who does not normally meet with pt. I passed this along to RN Ave Leisure, who graciously received this request.. Chaplains remain available.

## 2024-04-20 NOTE — Progress Notes (Signed)
 [facility]                                                                               [name], is a 58 y.o. male, DOB - 08/09/1966, UJW:[id number] Admit date - 03/04/2024    Outpatient Primary MD for the patient is [name] Abo, MD  LOS - 47  days    Brief summary   58 year old with a history of cardiomyopathy and cocaine abuse who presented to the ER 03/04/2024 with slurred speech, confusion, and right-sided weakness. CT head noted a left MCA infarct with CTa of the head and neck revealing an M2 occlusion on the left. Echocardiogram revealed a large mobile left ventricular thrombus with left atrial thrombus as well as right ventricular apical thrombus and an EF of <20%. PT/OT have recommended SNF placement on discharge. The patient has been found to lack medical decision-making capacity such that he is not able to determine his discharge disposition nor is he allowed to sign out AMA.  Hospital course over the last one week significant for non compliant to meds, refusing to be examined and refusing to take meds.     Assessment & Plan    Assessment and Plan: * Acute CVA (cerebrovascular accident) (HCC) Has undergone extensive workup with CT revealing left MCA territory infarct affecting the insula, caudate nucleus, lentiform nucleus, and internal capsule - MRI confirmed these infarcts and also revealed evidence of multiple old cerebellar infarcts as well as findings of chronic small vessel ischemia -TTE noted a large mobile clot in the LV attached to the intraventricular septum just under the mitral valve as well as severely decreased global LV function with an EF of 20% as well as a small RV apical clot and severely reduced RV systolic function as well as a large and mobile clot in the left atrium -LDL 71 -A1c 5.8 -Continue Eliquis  for intracardiac thrombus - patient has been counseled on multiple occasions on need for very consistent Eliquis  compliance to avoid  recurrent stroke and even possible death -though psychiatry has deemed him incompetent he is still very much able to physically resist complying with care and there is no safe way to force medications on him but he refuses to take -as a result the nursing staff and the physician simply make multiple attempts throughout the day to convince the patient of the need to comply with medical therapy.    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  Patient denies any chest pain or sob.    Schizoaffective disorder bipolar type -agitation -lack of capacity Psychiatry has assisted in the care of this patient during his hospitalization - if patient attempts to leave the hospital he should be placed under involuntary commitment -TOC has contacted APS to pursue guardianship as patient lacks decision-making capacity - Psychiatry is presently recommending we await APS decision on guardianship prior to attempting to discharge him. He has refused to take meds and refused to be examined the last 3 to 4 days. He would talk for a few minutes  but then say No to be examined or take  meds.    Back pain Appears to have resolved.   HTN Elevated BP parameters. Refusing to take meds.     HLD Continue with crestor  if he agrees to take it.    DM2 CBG (last 3)  No results for input(s): "GLUCAP" in the last 72 hours. Well-controlled with A1c 5.8 -    Polysubstance abuse UDS positive for cocaine and benzodiazepines - has admitted to habitual use of crack cocaine   Deconditioning Ambulatory status complicated by previous right AKA and left BKA -patient has no insurance coverage for SNF   COPD (chronic obstructive pulmonary disease) (HCC) No wheezing heard.  Continue trelegy and SABA prn    Fullness in both ears  Dubrox  ear drops ordered and he reports improvement.     History of DVT (deep vein thrombosis) Resume eliquis .    Status post bilateral BKA. Chronic pain -continue the use of gabapentin , oxycodone  and muscle relaxants when can safely swallow      Estimated body mass index is 20.09 kg/m as calculated from the following:   Height as of this encounter: 5\' 9"  (1.753 m).   Weight as of this encounter: 61.7 kg.  Code Status:  full code.  DVT Prophylaxis:   apixaban  (ELIQUIS ) tablet 5 mg   Level of Care: Level of care: Med-Surg Family Communication: none at bedside.   Disposition Plan:     Remains inpatient appropriate:  placement.   Procedures:  None.   Consultants:   None.   Antimicrobials:   Anti-infectives (From admission, onward)    None        Medications  Scheduled Meds:  apixaban   5 mg Oral BID   carbamide peroxide  5 drop Both EARS BID   carvedilol   6.25 mg Oral BID WC   furosemide   40 mg Oral Daily   gabapentin   200 mg Oral BID   losartan   50 mg Oral Daily   revefenacin   175 mcg Nebulization Daily   rosuvastatin   5 mg Oral QHS   senna-docusate  1 tablet Oral BID   spironolactone   12.5 mg Oral Daily   Continuous Infusions: PRN Meds:.albuterol , feeding supplement, lactulose , oxyCODONE     Subjective:   [name] was seen and examined today.  No new complaints.   Objective:   Vitals:   04/20/24 0838 04/20/24 0845 04/20/24 1518 04/20/24 1558  BP:  (!) 151/93 (!) 165/105 (!) 148/108  Pulse:  86 91 86  Resp:  18 20 18   Temp:  98 F (36.7 C)  98.1 F (36.7 C)  TempSrc:  Oral  Oral  SpO2: 96% 95%  97%  Weight:      Height:        Intake/Output Summary (Last 24 hours) at 04/20/2024 1843 Last data filed at 04/20/2024 1040 Gross per 24 hour  Intake 100 ml  Output --  Net 100 ml    Filed Weights   03/26/24 0431 03/28/24 0456 04/04/24 0500  Weight: 42.7 kg 47.5 kg 61.7 kg     Exam Pt refused meds and refused to be examined.   Data  Reviewed:  I have personally reviewed following labs and imaging studies   CBC Lab Results  Component Value Date   WBC 5.5 04/16/2024   RBC 4.92 04/16/2024   HGB 14.9 04/16/2024   HCT 44.4 04/16/2024   MCV 90.2 04/16/2024   MCH 30.3 04/16/2024   PLT 163 04/16/2024   MCHC  33.6 04/16/2024   RDW 14.6 04/16/2024   LYMPHSABS 2.7 04/16/2024   MONOABS 0.7 04/16/2024   EOSABS 0.1 04/16/2024   BASOSABS 0.0 04/16/2024     Last metabolic panel Lab Results  Component Value Date   NA 137 04/16/2024   K 4.2 04/16/2024   CL 104 04/16/2024   CO2 20 (L) 04/16/2024   BUN 18 04/16/2024   CREATININE 0.91 04/16/2024   GLUCOSE 107 (H) 04/16/2024   GFRNONAA >60 04/16/2024   GFRAA >60 08/21/2020   CALCIUM  9.5 04/16/2024   PHOS 2.7 01/16/2024   PROT 7.0 03/10/2024   ALBUMIN 3.3 (L) 03/10/2024   BILITOT 1.6 (H) 03/10/2024   ALKPHOS 63 03/10/2024   AST 17 03/10/2024   ALT 30 03/10/2024   ANIONGAP 13 04/16/2024    CBG (last 3)  No results for input(s): "GLUCAP" in the last 72 hours.    Coagulation Profile: No results for input(s): "INR", "PROTIME" in the last 168 hours.   Radiology Studies: No results found.     Feliciana Horn M.D. [facility] 04/20/2024, 6:43 PM  Available via Epic secure chat 7am-7pm After 7 pm, please refer to night coverage provider listed on amion.

## 2024-04-20 NOTE — Progress Notes (Signed)
Pt refused shift assessment.

## 2024-04-20 NOTE — Plan of Care (Signed)
  Problem: Coping: Goal: Will verbalize positive feelings about self Outcome: Progressing   Problem: Self-Care: Goal: Ability to participate in self-care as condition permits will improve Outcome: Progressing   Problem: Nutrition: Goal: Risk of aspiration will decrease Outcome: Progressing Goal: Dietary intake will improve Outcome: Progressing

## 2024-04-20 NOTE — Progress Notes (Signed)
 RN attempted to perform shift assessment, obtained VS to adminstered scheduled meds. Patient refused RN to perform assessment and refused all medication with the exception of the ear drops. RN provided education on each med in correlation to BP control for stroke. Patient adamantly refused and said "just give me the drops". RN verbalize understanding. MD to notify.

## 2024-04-20 NOTE — Progress Notes (Signed)
 OT Cancellation Note  Patient Details Name: [name] MRN: [id number] DOB: [birth year]   Cancelled Treatment:    Reason Eval/Treat Not Completed: Patient declined, no reason specified Patient stating, "I have already made it very clear who I am going to work with. I dont appreciate other people coming in when I have already explained myself." OT providing active listening, and writing on patient's calendar when OT will be coming next. Patient appreciative at end of session.   Mollie Anger E. Rainen Vanrossum, OTR/L [facility] Services [phone]   Vincent Greek 04/20/2024, 8:42 AM

## 2024-04-20 NOTE — Progress Notes (Signed)
 RN attempted to provide scheduled medication, RN provided education on stroke and blood pressure control, patient relayed to RN that they want to speak to [name].

## 2024-04-21 DIAGNOSIS — I639 Cerebral infarction, unspecified: Secondary | ICD-10-CM | POA: Diagnosis not present

## 2024-04-21 NOTE — Progress Notes (Signed)
 [facility] PROGRESS NOTE    Progress Note  [name]  MVH:[id number] DOB: 06-Oct-1966 DOA: 03/04/2024 PCP: Youa Deloney Abo, MD     Brief Narrative:   [name] is an 58 y.o. 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  PT evaluated the patient recommended skilled nursing facility placement patient was found to lack medical decision capacity such that he is not amenable to determine his discharge disposition rule out to leave AGAINST MEDICAL ADVICE.  Over the last 1 week he has been noncompliant with his medication and refusing physical examination, he is also been refusing his oral meds.  Assessment/Plan:   Acute CVA (cerebrovascular accident) [facility]) He underwent extensive workup and imaging revealed left MCA territory infarct, caudate nucleus, lentiform nucleus and internal capsule MRI confirmed. TEE was noted for large mobile left lower clot just under the mitral valve with an EF of 20% and a small right apical clot with reduced RV systolic function. He was started on Eliquis  for intracardiac thrombus the patient has been counseled multiple times about being compliant with his medication and the risk of recurrent CVA and possible death. Psychiatry has deemed the patient to lack capacity and resist compliant with care.  There is no safe way to force medication on him but he refuses to take it as a result nursing staff and physician simply make multiple attempts throughout the day to convince the patient of the need to comply with his medical therapy.  Cocaine induced cardiomyopathy with a large  multichamber intracardiac was: No signs of overload continue Lasix  losartan  Coreg  Aldactone  and Crestor . Last 2D echo showed an EF of less than 20%. Continue Eliquis .  Schizoaffective disorder bipolar disorder lacks medical capacity: Psych was consulted and recommended that the patient lacks medical capacity to make decisions if the patient attempts to leave AGAINST MEDICAL ADVICE he should be involuntarily committed. TOC has contacted APS to pursue guardianship as patient lacks medical decision capacity. Psych is presently recommending to await APS guardianship prior to attempting discharging him. Use to take his meds over the last 3 to 4 days. I have talked to him today and he agreed to take his medication.  Back pain: Resolved.  Elevated blood pressure: Resume his current regimen but he has refused it over the last several days.  Hyperlipidemia: Continue Crestor .  Diabetes mellitus type 2: With last A1c of 5.4 continue to monitor CBGs.  Polysubstance abuse: UDS was positive for cocaine and benzos.  Deconditioning: Ambulatory status complicated by previous right AKA and left BKA has no insurance coverage for SNF.  History of COPD: Continue inhalers.  History of DVT: Continue Eliquis .   DVT prophylaxis: lovenox  Family Communication:none Status is: Inpatient Remains inpatient appropriate because: Waiting placement.    Code Status:     Code Status Orders  (From admission, onward)           Start     Ordered   03/04/24 1733  Full code  Continuous       Question:  By:  Answer:  Consent: discussion documented in EHR   03/04/24 1733  Code Status History     Date Active Date Inactive Code Status Order ID Comments User Context   01/14/2024 0224 01/18/2024 2101 Full Code [id number]  Sabas Cradle, MD ED   01/02/2024 1725 01/06/2024 2117 Full Code [id number]  Lena Qualia, MD ED   12/20/2023 0601 12/22/2023 1832 Full Code [id number]  Walton Guppy, MD ED   10/22/2023 1835 10/24/2023 1159 Full Code [id number]  Merdis Stalling, MD ED   12/07/2022 1712 06/16/2023 1820 Full Code [id number]  Wilfred Hanlon, DO ED   11/24/2022 0745 12/03/2022 1902 Full Code [id number]  Danice Dural, MD ED   11/03/2022 0253 11/03/2022 1937 Full Code [id number]  Coretha Dew, PA-C ED   08/17/2022 0142 10/09/2022 2035 Full Code [id number]  Sabas Cradle, MD ED   07/12/2022 1026 07/25/2022 2113 Full Code [id number]  Danice Dural, MD ED   04/04/2022 1240 04/11/2022 1710 Full Code [id number]  Danice Dural, MD ED   04/03/2022 1411 04/04/2022 1240 Full Code [id number]  Elisa Guest, PA-C ED   03/23/2022 2112 03/31/2022 1517 Full Code [id number]  Etter Hermann., MD Inpatient   02/26/2022 1812 03/07/2022 1908 Full Code [id number]  Wilfred Hanlon, DO Inpatient   01/29/2022 1515 02/05/2022 1533 Full Code [id number]  Raymona Caldwell, MD ED   12/03/2021 0841 12/09/2021 1457 Full Code [id number]  True Fuss, MD ED   11/24/2021 2315 11/25/2021 2004 Full Code [id number]  Muthersbaugh, Lilia Reges ED   11/13/2021 1653 11/21/2021 1748 Full Code [id number]  Danice Dural, MD Inpatient   11/02/2021 2000 11/04/2021 2331 Full Code [id number]  Deatra Face, MD ED   07/03/2021 1143 07/05/2021 0057 Full Code [id number]  Rosealee Concha, MD ED   03/02/2021 1936 03/12/2021 1811 Full Code [id number]  Kenny Peals, MD ED   02/10/2021 2345 02/13/2021 1958 Full Code [id number]  Coretha Dew, PA-C ED   02/09/2021 1953 02/10/2021 1536 Full Code [id number]  Vance Gell, MD ED   12/17/2020 1516 02/03/2021 1650 Full Code [id number]  Glorianne Largo, NP ED   12/08/2020 1336 12/10/2020 1931 Full Code [id number]  Edsel Grace, MD ED   11/13/2020 2028 11/16/2020 1520 Full Code [id number]  Wilfred Hanlon, DO ED   10/26/2020 1039 10/31/2020 2157 Full Code [id number]  Karalee Oscar, MD ED   10/15/2020 0611 10/18/2020 1810 Full Code [id number]  Chotiner, Pauline Bos, MD Inpatient   10/02/2020 2247  10/07/2020 1809 Full Code [id number]  Walton Guppy, MD ED   09/25/2020 1559 09/26/2020 2002 Full Code [id number]  Lavanda Porter, MD ED   09/09/2020 1251 09/11/2020 1546 Full Code [id number]  Daren Eck, DO Inpatient   09/07/2020 1753 09/09/2020 1251 DNR [id number]  Wilfred Hanlon, DO Inpatient   07/21/2020 1700 07/23/2020 2001 Full Code [id number]  Karlyne Oxford, MD ED   07/07/2020 1529 07/07/2020 2109 Full Code [id number]  Arvilla Birmingham, MD ED   06/24/2019 0131 06/24/2019 1630 Full Code [id number]  Sharin [name], NP Inpatient   06/24/2019 0130 06/24/2019 0131 Full Code [id number]  Cortez Dines, NP Inpatient   06/21/2019 0941 06/22/2019 2007 Full Code [id number]  Patrica Bookman, DO Inpatient   05/24/2019 1117 05/26/2019 1836 Full Code [id number]  Massie Soles, MD ED   04/18/2019 0149 04/19/2019 1628 DNR [id number]  Mickael Alamo, MD ED   04/04/2019 0555 04/06/2019 1413 Full Code [id number]  Thersia Flax, MD ED   03/27/2019 2130 03/29/2019 1900 Full  Code [id number]  Karlyne Oxford, MD ED   03/04/2019 1600 03/09/2019 1222 Full Code [id number]  Boby Bury, MD ED   02/13/2018 0439 02/14/2018 1333 Full Code [id number]  Earma Gloss, MD ED   12/26/2016 0547 12/29/2016 2021 Full Code [id number]  Clapacs, Elida Grounds, MD Inpatient   11/27/2016 1541 11/28/2016 1319 Full Code [id number]  Mirta Ammon, PA-C ED   10/29/2016 1415 10/31/2016 1150 Full Code [id number]  Mozell Arias, MD ED   09/06/2016 2230 09/07/2016 1350 Full Code [id number]  Toi Foster, MD ED   09/06/2016 0934 09/06/2016 1529 Full Code [id number]  Freeda Jerry, MD ED   09/02/2016 0045 09/04/2016 2127 Full Code [id number]  Franceen Inches, NP ED   07/30/2016 1357 08/06/2016 2031 Full Code [id number]  Albert Almas, FNP Inpatient   07/30/2016 0143 07/30/2016 1153 Full Code [id number]  Ballard Bongo, MD ED   05/30/2016 1622 06/05/2016 1454 Full Code [id number]  Ferrel Hsu, NP Inpatient   05/29/2016 1635 05/30/2016 1622 Full Code [id number]  Albertus Hughs, DO  ED   12/02/2011 0245 12/12/2011 0720 Full Code [id number]  Lyna Sandhoff, PA ED         IV Access:   Peripheral IV   Procedures and diagnostic studies:   No results found.   Medical Consultants:   None.   Subjective:    [name] no complaints  Objective:    Vitals:   04/20/24 1558 04/20/24 2045 04/21/24 0756 04/21/24 0900  BP: (!) 148/108 (!) 144/97 (!) 146/87   Pulse: 86 74 94   Resp: 18 18 19    Temp: 98.1 F (36.7 C) 98.2 F (36.8 C) 98.3 F (36.8 C)   TempSrc: Oral Oral Oral   SpO2: 97% 98% 98% 97%  Weight:      Height:       SpO2: 97 % O2 Flow Rate (L/min): 2 L/min FiO2 (%): 28 %   Intake/Output Summary (Last 24 hours) at 04/21/2024 0951 Last data filed at 04/20/2024 1040 Gross per 24 hour  Intake 100 ml  Output --  Net 100 ml   Filed Weights   03/26/24 0431 03/28/24 0456 04/04/24 0500  Weight: 42.7 kg 47.5 kg 61.7 kg    Exam: General exam: In no acute distress. Respiratory system: Good air movement and clear to auscultation. Cardiovascular system: S1 & S2 heard, RRR. No JVD. Gastrointestinal system: Abdomen is nondistended, soft and nontender.  Extremities: Bilateral lower extremity BKA Skin: No rashes, lesions or ulcers Psychiatry: Judgement and insight appear normal. Mood & affect appropriate.    Data Reviewed:    Labs: Basic Metabolic Panel: Recent Labs  Lab 04/16/24 0842  NA 137  K 4.2  CL 104  CO2 20*  GLUCOSE 107*  BUN 18  CREATININE 0.91  CALCIUM  9.5   GFR Estimated Creatinine Clearance: 80 mL/min (by C-G formula based on SCr of 0.91 mg/dL). Liver Function Tests: No results for input(s): "AST", "ALT", "ALKPHOS", "BILITOT", "PROT", "ALBUMIN" in the last 168 hours. No results for input(s): "LIPASE", "AMYLASE" in the last 168 hours. No results for input(s): "AMMONIA" in the last 168 hours. Coagulation profile No results for input(s): "INR", "PROTIME" in the last 168 hours. COVID-19 Labs  No results for  input(s): "DDIMER", "FERRITIN", "LDH", "CRP" in the last 72 hours.  Lab Results  Component Value Date   SARSCOV2NAA NEGATIVE 01/13/2024   SARSCOV2NAA NEGATIVE 01/02/2024   SARSCOV2NAA NEGATIVE 12/20/2023   SARSCOV2NAA NEGATIVE 11/23/2022    CBC: Recent Labs  Lab 04/16/24 0842  WBC 5.5  NEUTROABS 2.0  HGB 14.9  HCT 44.4  MCV 90.2  PLT 163   Cardiac Enzymes: No results for input(s): "CKTOTAL", "CKMB", "CKMBINDEX", "TROPONINI" in the last 168 hours. BNP (last 3 results) No results for input(s): "PROBNP" in the last 8760 hours. CBG: No results for input(s): "GLUCAP" in the last 168 hours. D-Dimer: No results for input(s): "DDIMER" in the last 72 hours. Hgb A1c: No results for input(s): "HGBA1C" in the last 72 hours. Lipid Profile: No results for input(s): "CHOL", "HDL", "LDLCALC", "TRIG", "CHOLHDL", "LDLDIRECT" in the last 72 hours. Thyroid  function studies: No results for input(s): "TSH", "T4TOTAL", "T3FREE", "THYROIDAB" in the last 72 hours.  Invalid input(s): "FREET3" Anemia work up: No results for input(s): "VITAMINB12", "FOLATE", "FERRITIN", "TIBC", "IRON", "RETICCTPCT" in the last 72 hours. Sepsis Labs: Recent Labs  Lab 04/16/24 0842  WBC 5.5   Microbiology No results found for this or any previous visit (from the past 240 hours).   Medications:    apixaban   5 mg Oral BID   carbamide peroxide  5 drop Both EARS BID   carvedilol   6.25 mg Oral BID WC   furosemide   40 mg Oral Daily   gabapentin   200 mg Oral BID   losartan   50 mg Oral Daily   revefenacin   175 mcg Nebulization Daily   rosuvastatin   5 mg Oral QHS   senna-docusate  1 tablet Oral BID   spironolactone   12.5 mg Oral Daily   Continuous Infusions:    LOS: 48 days   Macdonald Savoy  [facility]  04/21/2024, 9:51 AM

## 2024-04-21 NOTE — Progress Notes (Signed)
 RN attempted to perform shift assessment, patient refused. Patient verbalize that he will take his morning meds. RN administered all scheduled meds with the exception of lasix  tablet. Patient refused the lasix  pill. Patient asked for his pain and stool softener.

## 2024-04-21 NOTE — Plan of Care (Signed)
   Problem: Health Behavior/Discharge Planning: Goal: Ability to manage health-related needs will improve Outcome: Progressing

## 2024-04-21 NOTE — Progress Notes (Signed)
 Pt refused to take his Crestor . He thinks that he takes it twice a day. I attempted to explain that he only takes it once a day at bedtime and it is an important medication. He still refused to take it.

## 2024-04-21 NOTE — Plan of Care (Signed)
  Problem: Education: Goal: Knowledge of secondary prevention will improve (MUST DOCUMENT ALL) Outcome: Progressing   Problem: Coping: Goal: Will verbalize positive feelings about self Outcome: Progressing

## 2024-04-22 DIAGNOSIS — I639 Cerebral infarction, unspecified: Secondary | ICD-10-CM | POA: Diagnosis not present

## 2024-04-22 NOTE — Plan of Care (Signed)
  Problem: Education: Goal: Knowledge of disease or condition will improve Outcome: Progressing   Problem: Ischemic Stroke/TIA Tissue Perfusion: Goal: Complications of ischemic stroke/TIA will be minimized Outcome: Progressing   Problem: Self-Care: Goal: Verbalization of feelings and concerns over difficulty with self-care will improve Outcome: Progressing Goal: Ability to communicate needs accurately will improve Outcome: Progressing   Problem: Nutrition: Goal: Risk of aspiration will decrease Outcome: Progressing Goal: Dietary intake will improve Outcome: Progressing   Problem: Health Behavior/Discharge Planning: Goal: Ability to manage health-related needs will improve Outcome: Not Progressing

## 2024-04-22 NOTE — Progress Notes (Signed)
 [facility] PROGRESS NOTE    Progress Note  [name]  ZHY:[id number] DOB: 03/08/1966 DOA: 03/04/2024 PCP: Ambria Mayfield Abo, MD     Brief Narrative:   [name] is an 58 y.o. 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  PT evaluated the patient recommended skilled nursing facility placement patient was found to lack medical decision capacity such that he is not amenable to determine his discharge disposition rule out to leave AGAINST MEDICAL ADVICE.  Over the last 1 week he has been noncompliant with his medication and refusing physical examination, he is also been refusing his oral meds.  Assessment/Plan:   Acute CVA (cerebrovascular accident) [facility]) He underwent extensive workup and imaging revealed left MCA territory infarct, caudate nucleus, lentiform nucleus and internal capsule MRI confirmed. TEE was noted for large mobile left lower clot just under the mitral valve with an EF of 20% and a small right apical clot with reduced RV systolic function. He was started on Eliquis  for intracardiac thrombus the patient has been counseled multiple times about being compliant with his medication and the risk of recurrent CVA and possible death. Psychiatry has deemed the patient to lack capacity and resist compliant with care.  There is no safe way to force medication on him but he refuses to take it as a result nursing staff and physician simply make multiple attempts throughout the day to convince the patient of the need to comply with his medical therapy.  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 Last 2D echo showed an EF of less than 20%. Continue Eliquis .  Schizoaffective disorder bipolar disorder lacks medical capacity: Psych was consulted and recommended that the patient lacks medical capacity to make decisions if the patient attempts to leave AGAINST MEDICAL ADVICE he should be involuntarily committed. TOC has contacted APS to pursue guardianship as patient lacks medical decision capacity. Psych is presently recommending to await APS guardianship prior to attempting discharging him. Use to take his meds over the last 3 to 4 days. I have talked to him today and he agreed to take his medication.  Back pain: Resolved.  Elevated blood pressure: Resume his current regimen but he has refused it over the last several days.  Hyperlipidemia: Continue Crestor .  Diabetes mellitus type 2: With last A1c of 5.4 continue to monitor CBGs.  Polysubstance abuse: UDS was positive for cocaine and benzos.  Deconditioning: Ambulatory status complicated by previous right AKA and left BKA has no insurance coverage for SNF.  History of COPD: Continue inhalers.  History of DVT: Continue Eliquis .   DVT prophylaxis: lovenox  Family Communication:none Status is: Inpatient Remains inpatient appropriate because: Waiting placement.    Code Status:     Code Status Orders  (From admission, onward)           Start     Ordered   03/04/24 1733  Full code  Continuous       Question:  By:  Answer:  Consent: discussion documented in EHR   03/04/24 1733  Code Status History     Date Active Date Inactive Code Status Order ID Comments User Context   01/14/2024 0224 01/18/2024 2101 Full Code [id number]  Sabas Cradle, MD ED   01/02/2024 1725 01/06/2024 2117 Full Code [id number]  Lena Qualia, MD ED   12/20/2023 0601 12/22/2023 1832 Full Code [id number]  Walton Guppy, MD ED   10/22/2023 1835 10/24/2023 1159 Full Code [id number]  Merdis Stalling, MD ED   12/07/2022 1712 06/16/2023 1820 Full Code [id number]  Wilfred Hanlon, DO ED   11/24/2022 0745 12/03/2022 1902 Full Code [id number]  Danice Dural, MD ED   11/03/2022 0253 11/03/2022 1937 Full Code [id number]  Coretha Dew, PA-C ED   08/17/2022 0142 10/09/2022 2035 Full Code [id number]  Sabas Cradle, MD ED   07/12/2022 1026 07/25/2022 2113 Full Code [id number]  Danice Dural, MD ED   04/04/2022 1240 04/11/2022 1710 Full Code [id number]  Danice Dural, MD ED   04/03/2022 1411 04/04/2022 1240 Full Code [id number]  Elisa Guest, PA-C ED   03/23/2022 2112 03/31/2022 1517 Full Code [id number]  Etter Hermann., MD Inpatient   02/26/2022 1812 03/07/2022 1908 Full Code [id number]  Wilfred Hanlon, DO Inpatient   01/29/2022 1515 02/05/2022 1533 Full Code [id number]  Raymona Caldwell, MD ED   12/03/2021 0841 12/09/2021 1457 Full Code [id number]  True Fuss, MD ED   11/24/2021 2315 11/25/2021 2004 Full Code [id number]  Muthersbaugh, Lilia Reges ED   11/13/2021 1653 11/21/2021 1748 Full Code [id number]  Danice Dural, MD Inpatient   11/02/2021 2000 11/04/2021 2331 Full Code [id number]  Deatra Face, MD ED   07/03/2021 1143 07/05/2021 0057 Full Code [id number]  Rosealee Concha, MD ED   03/02/2021 1936 03/12/2021 1811 Full Code [id number]  Kenny Peals, MD ED   02/10/2021 2345 02/13/2021 1958 Full Code [id number]  Coretha Dew, PA-C ED   02/09/2021 1953 02/10/2021 1536 Full Code [id number]  Vance Gell, MD ED   12/17/2020 1516 02/03/2021 1650 Full Code [id number]  Glorianne Largo, NP ED   12/08/2020 1336 12/10/2020 1931 Full Code [id number]  Edsel Grace, MD ED   11/13/2020 2028 11/16/2020 1520 Full Code [id number]  Wilfred Hanlon, DO ED   10/26/2020 1039 10/31/2020 2157 Full Code [id number]  Karalee Oscar, MD ED   10/15/2020 0611 10/18/2020 1810 Full Code [id number]  Chotiner, Pauline Bos, MD Inpatient   10/02/2020  2247 10/07/2020 1809 Full Code [id number]  Walton Guppy, MD ED   09/25/2020 1559 09/26/2020 2002 Full Code [id number]  Lavanda Porter, MD ED   09/09/2020 1251 09/11/2020 1546 Full Code [id number]  Daren Eck, DO Inpatient   09/07/2020 1753 09/09/2020 1251 DNR [id number]  Wilfred Hanlon, DO Inpatient   07/21/2020 1700 07/23/2020 2001 Full Code [id number]  Karlyne Oxford, MD ED   07/07/2020 1529 07/07/2020 2109 Full Code [id number]  Arvilla Birmingham, MD ED   06/24/2019 0131 06/24/2019 1630 Full Code [id number]  Sharin [name], NP Inpatient   06/24/2019 0130 06/24/2019 0131 Full Code [id number]  Cortez Dines, NP Inpatient   06/21/2019 0941 06/22/2019 2007 Full Code [id number]  Patrica Bookman, DO Inpatient   05/24/2019 1117 05/26/2019 1836 Full Code [id number]  Massie Soles, MD ED   04/18/2019 0149 04/19/2019 1628 DNR [id number]  Mickael Alamo, MD ED   04/04/2019 0555 04/06/2019 1413 Full Code [id number]  Thersia Flax, MD ED   03/27/2019 2130 03/29/2019 1900 Full  Code [id number]  Karlyne Oxford, MD ED   03/04/2019 1600 03/09/2019 1222 Full Code [id number]  Boby Bury, MD ED   02/13/2018 0439 02/14/2018 1333 Full Code [id number]  Earma Gloss, MD ED   12/26/2016 0547 12/29/2016 2021 Full Code [id number]  Clapacs, Elida Grounds, MD Inpatient   11/27/2016 1541 11/28/2016 1319 Full Code [id number]  Mirta Ammon, PA-C ED   10/29/2016 1415 10/31/2016 1150 Full Code [id number]  Mozell Arias, MD ED   09/06/2016 2230 09/07/2016 1350 Full Code [id number]  Toi Foster, MD ED   09/06/2016 0934 09/06/2016 1529 Full Code [id number]  Freeda Jerry, MD ED   09/02/2016 0045 09/04/2016 2127 Full Code [id number]  Franceen Inches, NP ED   07/30/2016 1357 08/06/2016 2031 Full Code [id number]  Albert Almas, FNP Inpatient   07/30/2016 0143 07/30/2016 1153 Full Code [id number]  Ballard Bongo, MD ED   05/30/2016 1622 06/05/2016 1454 Full Code [id number]  Ferrel Hsu, NP Inpatient   05/29/2016 1635 05/30/2016 1622 Full Code [id number]  Albertus Hughs,  DO ED   12/02/2011 0245 12/12/2011 0720 Full Code [id number]  Lyna Sandhoff, PA ED         IV Access:   Peripheral IV   Procedures and diagnostic studies:   No results found.   Medical Consultants:   None.   Subjective:    [name] no complaints  Objective:    Vitals:   04/21/24 0900 04/21/24 1801 04/21/24 2042 04/21/24 2332  BP:  (!) 164/123 (!) 149/99 (!) 133/93  Pulse:  81 69 67  Resp:    18  Temp:   97.6 F (36.4 C) 97.8 F (36.6 C)  TempSrc:   Axillary Oral  SpO2: 97%  100% 97%  Weight:      Height:       SpO2:  (pt refused 2 RN's) O2 Flow Rate (L/min): 2 L/min FiO2 (%): 28 %   Intake/Output Summary (Last 24 hours) at 04/22/2024 1012 Last data filed at 04/22/2024 0900 Gross per 24 hour  Intake 580 ml  Output 500 ml  Net 80 ml   Filed Weights   03/26/24 0431 03/28/24 0456 04/04/24 0500  Weight: 42.7 kg 47.5 kg 61.7 kg    Exam: General exam: In no acute distress. Respiratory system: Good air movement and clear to auscultation. Cardiovascular system: S1 & S2 heard, RRR. No JVD. Gastrointestinal system: Abdomen is nondistended, soft and nontender.  Extremities: Bilateral lower extremity BKA Skin: No rashes, lesions or ulcers Psychiatry: Judgement and insight appear normal. Mood & affect appropriate.    Data Reviewed:    Labs: Basic Metabolic Panel: Recent Labs  Lab 04/16/24 0842  NA 137  K 4.2  CL 104  CO2 20*  GLUCOSE 107*  BUN 18  CREATININE 0.91  CALCIUM  9.5   GFR Estimated Creatinine Clearance: 80 mL/min (by C-G formula based on SCr of 0.91 mg/dL). Liver Function Tests: No results for input(s): "AST", "ALT", "ALKPHOS", "BILITOT", "PROT", "ALBUMIN" in the last 168 hours. No results for input(s): "LIPASE", "AMYLASE" in the last 168 hours. No results for input(s): "AMMONIA" in the last 168 hours. Coagulation profile No results for input(s): "INR", "PROTIME" in the last 168 hours. COVID-19 Labs  No results for  input(s): "DDIMER", "FERRITIN", "LDH", "CRP" in the last 72 hours.  Lab Results  Component Value Date   SARSCOV2NAA NEGATIVE 01/13/2024   SARSCOV2NAA NEGATIVE 01/02/2024   SARSCOV2NAA NEGATIVE 12/20/2023   SARSCOV2NAA NEGATIVE 11/23/2022    CBC: Recent Labs  Lab 04/16/24 0842  WBC 5.5  NEUTROABS 2.0  HGB 14.9  HCT 44.4  MCV 90.2  PLT 163   Cardiac Enzymes: No results for input(s): "CKTOTAL", "CKMB", "CKMBINDEX", "TROPONINI" in the last 168 hours. BNP (last 3 results) No results for input(s): "PROBNP" in the last 8760 hours. CBG: No results for input(s): "GLUCAP" in the last 168 hours. D-Dimer: No results for input(s): "DDIMER" in the last 72 hours. Hgb A1c: No results for input(s): "HGBA1C" in the last 72 hours. Lipid Profile: No results for input(s): "CHOL", "HDL", "LDLCALC", "TRIG", "CHOLHDL", "LDLDIRECT" in the last 72 hours. Thyroid  function studies: No results for input(s): "TSH", "T4TOTAL", "T3FREE", "THYROIDAB" in the last 72 hours.  Invalid input(s): "FREET3" Anemia work up: No results for input(s): "VITAMINB12", "FOLATE", "FERRITIN", "TIBC", "IRON", "RETICCTPCT" in the last 72 hours. Sepsis Labs: Recent Labs  Lab 04/16/24 0842  WBC 5.5   Microbiology No results found for this or any previous visit (from the past 240 hours).   Medications:    apixaban   5 mg Oral BID   carvedilol   6.25 mg Oral BID WC   furosemide   40 mg Oral Daily   gabapentin   200 mg Oral BID   losartan   50 mg Oral Daily   revefenacin   175 mcg Nebulization Daily   rosuvastatin   5 mg Oral QHS   senna-docusate  1 tablet Oral BID   spironolactone   12.5 mg Oral Daily   Continuous Infusions:    LOS: 49 days   Macdonald Savoy  [facility]  04/22/2024, 10:12 AM

## 2024-04-22 NOTE — TOC Progression Note (Signed)
 Transition of Care [facility]) - Progression Note    Patient Details  Name: [name] MRN: [id number] Date of Birth: 04-16-1966  Transition of [facility]) CM/SW Contact  Tandy Fam, [name] Phone Number: 04/22/2024, 1:01 PM  Clinical Narrative:   CSW updated by RN that patient was told he may have a hearing and that patient wanted an update on what was going on. CSW attempted to reach [place] with APS, left a voicemail to ask about status of a hearing per patient. Awaiting call back.    Expected Discharge Plan: Skilled Nursing Facility Barriers to Discharge: Continued Medical Work up, English as a second language teacher, Active Substance Use - Placement, Inadequate or no insurance, SNF Pending payor source - LOG, SNF Pending Medicaid, SNF Pending bed offer  Expected Discharge Plan and Services       Living arrangements for the past 2 months: Apartment Expected Discharge Date: 03/14/24                                     Social Determinants of Health (SDOH) Interventions SDOH Screenings   Food Insecurity: Food Insecurity Present (03/06/2024)  Housing: High Risk (03/06/2024)  Transportation Needs: No Transportation Needs (03/06/2024)  Utilities: Patient Declined (03/06/2024)  Alcohol Screen: Medium Risk (06/24/2019)  Depression (PHQ2-9): Low Risk  (02/18/2024)  Recent Concern: Depression (PHQ2-9) - Medium Risk (01/21/2024)  Financial Resource Strain: High Risk (07/24/2021)   Received from [facility]  Physical Activity: Inactive (02/18/2024)  Social Connections: Unknown (04/05/2022)   Received from [facility]  Stress: Stress Concern Present (07/24/2021)   Received from [name]  Tobacco Use: High Risk (03/04/2024)  Health Literacy: Adequate Health Literacy (02/18/2024)    Readmission Risk Interventions    12/21/2023    9:25 AM 12/09/2022    9:02 AM 12/01/2022    3:56 PM  Readmission Risk Prevention Plan  Transportation Screening  Complete Complete Complete  HRI or Home Care Consult Complete    Social Work Consult for Recovery Care Planning/Counseling Complete    Palliative Care Screening Not Applicable    Medication Review Oceanographer) Complete Complete Complete  PCP or Specialist appointment within 3-5 days of discharge  Complete Complete  HRI or Home Care Consult  Complete Complete  SW Recovery Care/Counseling Consult  Complete Complete  Palliative Care Screening  Not Applicable Not Applicable  Skilled Nursing Facility  Not Applicable Not Applicable  SNF Comments   n/a

## 2024-04-23 DIAGNOSIS — I639 Cerebral infarction, unspecified: Secondary | ICD-10-CM | POA: Diagnosis not present

## 2024-04-23 NOTE — Progress Notes (Signed)
 [facility] PROGRESS NOTE    Progress Note  [name]  ZOX:[id number] DOB: Mar 03, 1966 DOA: 03/04/2024 PCP: Melaya Hoselton Abo, MD     Brief Narrative:   [name] is an 58 y.o. 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  PT evaluated the patient recommended skilled nursing facility placement patient was found to lack medical decision capacity such that he is not amenable to determine his discharge disposition rule out to leave AGAINST MEDICAL ADVICE.  Over the last 1 week he has been noncompliant with his medication and refusing physical examination, he is also been refusing his oral meds.  TOC is pursuing guardianship with APS as he lacks medical decisional capacity.  Psychiatry presently recommending to await APS decision for guardianship before discharging home.  Assessment/Plan:   Acute CVA (cerebrovascular accident) [facility]) He underwent extensive workup and imaging revealed left MCA territory infarct, caudate nucleus, lentiform nucleus and internal capsule MRI confirmed. TEE was noted for large mobile left lower clot just under the mitral valve with an EF of 20% and a small right apical clot with reduced RV systolic function. He was started on Eliquis  for intracardiac thrombus the patient has been counseled multiple times about being compliant with his medication and the risk of recurrent CVA and possible death. Psychiatry has deemed the patient to lack capacity and resist compliant with care.   There is no safe way to force medication on him but he refuses to take it as a result nursing staff and  physician simply make multiple attempts throughout the day to convince the patient of the need to comply with his medical therapy.  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 Last 2D echo showed an EF of less than 20%. Continue Eliquis .  Schizoaffective disorder bipolar disorder lacks medical capacity: Psych was consulted and recommended that the patient lacks medical capacity to make decisions if the patient attempts to leave AGAINST MEDICAL ADVICE he should be involuntarily committed. TOC has contacted APS to pursue guardianship as patient lacks medical decision capacity. Psych is presently recommending to await APS guardianship prior to attempting discharging him. Use to take his meds over the last 3 to 4 days. I have talked to him today and he agreed to take his medication.  Back pain: Resolved.  Elevated blood pressure: Resume his current regimen but he has refused it over the last several days.  Hyperlipidemia: Continue Crestor .  Diabetes mellitus type 2: With last A1c of 5.4 continue to monitor CBGs.  Polysubstance abuse: UDS was positive for cocaine and benzos.  Deconditioning: Ambulatory status complicated by previous right AKA and left BKA has no insurance coverage for SNF.  History of COPD: Continue inhalers.  History of DVT: Continue Eliquis .   DVT prophylaxis: lovenox  Family Communication:none Status is: Inpatient Remains inpatient appropriate because: Waiting placement.    Code Status:     Code Status Orders  (From admission, onward)           Start     Ordered   03/04/24 1733  Full code  Continuous  Question:  By:  Answer:  Consent: discussion documented in EHR   03/04/24 1733           Code Status History     Date Active Date Inactive Code Status Order ID Comments User Context   01/14/2024 0224 01/18/2024 2101 Full Code  [id number]  Sabas Cradle, MD ED   01/02/2024 1725 01/06/2024 2117 Full Code [id number]  Lena Qualia, MD ED   12/20/2023 0601 12/22/2023 1832 Full Code [id number]  Walton Guppy, MD ED   10/22/2023 1835 10/24/2023 1159 Full Code [id number]  Merdis Stalling, MD ED   12/07/2022 1712 06/16/2023 1820 Full Code [id number]  Wilfred Hanlon, DO ED   11/24/2022 0745 12/03/2022 1902 Full Code [id number]  Danice Dural, MD ED   11/03/2022 0253 11/03/2022 1937 Full Code [id number]  Coretha Dew, PA-C ED   08/17/2022 0142 10/09/2022 2035 Full Code [id number]  Sabas Cradle, MD ED   07/12/2022 1026 07/25/2022 2113 Full Code [id number]  Danice Dural, MD ED   04/04/2022 1240 04/11/2022 1710 Full Code [id number]  Danice Dural, MD ED   04/03/2022 1411 04/04/2022 1240 Full Code [id number]  Elisa Guest, PA-C ED   03/23/2022 2112 03/31/2022 1517 Full Code [id number]  Etter Hermann., MD Inpatient   02/26/2022 1812 03/07/2022 1908 Full Code [id number]  Wilfred Hanlon, DO Inpatient   01/29/2022 1515 02/05/2022 1533 Full Code [id number]  Raymona Caldwell, MD ED   12/03/2021 0841 12/09/2021 1457 Full Code [id number]  True Fuss, MD ED   11/24/2021 2315 11/25/2021 2004 Full Code [id number]  Muthersbaugh, Lilia Reges ED   11/13/2021 1653 11/21/2021 1748 Full Code [id number]  Danice Dural, MD Inpatient   11/02/2021 2000 11/04/2021 2331 Full Code [id number]  Deatra Face, MD ED   07/03/2021 1143 07/05/2021 0057 Full Code [id number]  Rosealee Concha, MD ED   03/02/2021 1936 03/12/2021 1811 Full Code [id number]  Kenny Peals, MD ED   02/10/2021 2345 02/13/2021 1958 Full Code [id number]  Coretha Dew, PA-C ED   02/09/2021 1953 02/10/2021 1536 Full Code [id number]  Vance Gell, MD ED   12/17/2020 1516 02/03/2021 1650 Full Code [id number]  Glorianne Largo, NP ED   12/08/2020 1336 12/10/2020 1931 Full Code [id number]  Edsel Grace, MD ED   11/13/2020 2028 11/16/2020 1520 Full Code [id number]  Wilfred Hanlon, DO ED    10/26/2020 1039 10/31/2020 2157 Full Code [id number]  Karalee Oscar, MD ED   10/15/2020 0611 10/18/2020 1810 Full Code [id number]  Chotiner, Pauline Bos, MD Inpatient   10/02/2020 2247 10/07/2020 1809 Full Code [id number]  Walton Guppy, MD ED   09/25/2020 1559 09/26/2020 2002 Full Code [id number]  Lavanda Porter, MD ED   09/09/2020 1251 09/11/2020 1546 Full Code [id number]  Daren Eck, DO Inpatient   09/07/2020 1753 09/09/2020 1251 DNR [id number]  Wilfred Hanlon, DO Inpatient   07/21/2020 1700 07/23/2020 2001 Full Code [id number]  Karlyne Oxford, MD ED   07/07/2020 1529 07/07/2020 2109 Full Code [id number]  Arvilla Birmingham, MD ED   06/24/2019 0131 06/24/2019 1630 Full Code [id number]  Sharin [name], NP Inpatient   06/24/2019 0130 06/24/2019 0131 Full Code [id number]  Cortez Dines, NP Inpatient   06/21/2019 0941 06/22/2019 2007 Full Code [id number]  Patrica Bookman, DO Inpatient   05/24/2019 1117 05/26/2019 1836 Full Code [id number]  Massie Soles, MD ED   04/18/2019 0149 04/19/2019 1628 DNR [id number]  Mickael Alamo, MD ED   04/04/2019 0555 04/06/2019 1413 Full Code [id number]  Thersia Flax, MD ED   03/27/2019 2130 03/29/2019 1900 Full Code [id number]  Karlyne Oxford, MD ED   03/04/2019 1600 03/09/2019 1222 Full Code [id number]  Boby Bury, MD ED   02/13/2018 0439 02/14/2018 1333 Full Code [id number]  Earma Gloss, MD ED   12/26/2016 0547 12/29/2016 2021 Full Code [id number]  Clapacs, Elida Grounds, MD Inpatient   11/27/2016 1541 11/28/2016 1319 Full Code [id number]  Mirta Ammon, PA-C ED   10/29/2016 1415 10/31/2016 1150 Full Code [id number]  Mozell Arias, MD ED   09/06/2016 2230 09/07/2016 1350 Full Code [id number]  Toi Foster, MD ED   09/06/2016 0934 09/06/2016 1529 Full Code [id number]  Freeda Jerry, MD ED   09/02/2016 0045 09/04/2016 2127 Full Code [id number]  Franceen Inches, NP ED   07/30/2016 1357 08/06/2016 2031 Full Code [id number]  Albert Almas, FNP Inpatient   07/30/2016 0143 07/30/2016 1153 Full Code [id number]   Ballard Bongo, MD ED   05/30/2016 1622 06/05/2016 1454 Full Code [id number]  Ferrel Hsu, NP Inpatient   05/29/2016 1635 05/30/2016 1622 Full Code [id number]  Albertus Hughs, DO ED   12/02/2011 0245 12/12/2011 0720 Full Code [id number]  Lyna Sandhoff, PA ED         IV Access:   Peripheral IV   Procedures and diagnostic studies:   No results found.   Medical Consultants:   None.   Subjective:    [name] no complaints  Objective:    Vitals:   04/22/24 1233 04/23/24 0359 04/23/24 0825 04/23/24 0859  BP: 118/86 (!) 159/113  (!) 142/102  Pulse: 81 85  70  Resp: 18 18  18   TempSrc:      SpO2: 97% 100% 99% 100%  Weight:      Height:       SpO2: 100 % O2 Flow Rate (L/min): 2 L/min FiO2 (%): 28 %   Intake/Output Summary (Last 24 hours) at 04/23/2024 0958 Last data filed at 04/23/2024 0858 Gross per 24 hour  Intake 480 ml  Output 1000 ml  Net -520 ml   Filed Weights   03/26/24 0431 03/28/24 0456 04/04/24 0500  Weight: 42.7 kg 47.5 kg 61.7 kg    Exam: General exam: In no acute distress. Respiratory system: Good air movement and clear to auscultation. Cardiovascular system: S1 & S2 heard, RRR. No JVD. Gastrointestinal system: Abdomen is nondistended, soft and nontender.  Extremities: Bilateral lower extremity BKA Skin: No rashes, lesions or ulcers Psychiatry: Judgement and insight appear normal. Mood & affect appropriate.    Data Reviewed:    Labs: Basic Metabolic Panel: No results for input(s): "NA", "K", "CL", "CO2", "GLUCOSE", "BUN", "CREATININE", "CALCIUM ", "MG", "PHOS" in the last 168 hours.  GFR Estimated Creatinine Clearance: 80 mL/min (by C-G formula based on SCr of 0.91 mg/dL). Liver Function Tests: No results for input(s): "AST", "ALT", "ALKPHOS", "BILITOT", "PROT", "ALBUMIN" in the last 168 hours. No results for input(s): "LIPASE", "AMYLASE" in the last 168 hours. No results for input(s): "AMMONIA" in the last 168 hours. Coagulation  profile No results for input(s): "INR", "PROTIME" in the last 168 hours. COVID-19 Labs  No results for input(s): "DDIMER", "FERRITIN", "LDH", "CRP" in the last 72 hours.  Lab Results  Component Value Date   SARSCOV2NAA NEGATIVE 01/13/2024   SARSCOV2NAA NEGATIVE 01/02/2024   SARSCOV2NAA NEGATIVE 12/20/2023   SARSCOV2NAA NEGATIVE 11/23/2022    CBC: No results for input(s): "WBC", "  NEUTROABS", "HGB", "HCT", "MCV", "PLT" in the last 168 hours.  Cardiac Enzymes: No results for input(s): "CKTOTAL", "CKMB", "CKMBINDEX", "TROPONINI" in the last 168 hours. BNP (last 3 results) No results for input(s): "PROBNP" in the last 8760 hours. CBG: No results for input(s): "GLUCAP" in the last 168 hours. D-Dimer: No results for input(s): "DDIMER" in the last 72 hours. Hgb A1c: No results for input(s): "HGBA1C" in the last 72 hours. Lipid Profile: No results for input(s): "CHOL", "HDL", "LDLCALC", "TRIG", "CHOLHDL", "LDLDIRECT" in the last 72 hours. Thyroid  function studies: No results for input(s): "TSH", "T4TOTAL", "T3FREE", "THYROIDAB" in the last 72 hours.  Invalid input(s): "FREET3" Anemia work up: No results for input(s): "VITAMINB12", "FOLATE", "FERRITIN", "TIBC", "IRON", "RETICCTPCT" in the last 72 hours. Sepsis Labs: No results for input(s): "PROCALCITON", "WBC", "LATICACIDVEN" in the last 168 hours.  Microbiology No results found for this or any previous visit (from the past 240 hours).   Medications:    apixaban   5 mg Oral BID   carvedilol   6.25 mg Oral BID WC   furosemide   40 mg Oral Daily   gabapentin   200 mg Oral BID   losartan   50 mg Oral Daily   revefenacin   175 mcg Nebulization Daily   rosuvastatin   5 mg Oral QHS   senna-docusate  1 tablet Oral BID   spironolactone   12.5 mg Oral Daily   Continuous Infusions:    LOS: 50 days   Macdonald Savoy  [facility]  04/23/2024, 9:58 AM

## 2024-04-23 NOTE — Progress Notes (Signed)
 Patient refused all evening medication including Eliquis . Patient will not give reasoning for refusing meds and is very irritable this evening. Educated patient on importance of medication compliance. MD on call informed.

## 2024-04-24 DIAGNOSIS — I639 Cerebral infarction, unspecified: Secondary | ICD-10-CM | POA: Diagnosis not present

## 2024-04-24 NOTE — Progress Notes (Signed)
 [facility] PROGRESS NOTE    Progress Note  [name]  QMV:[id number] DOB: 29-Jun-1966 DOA: 03/04/2024 PCP: Valerye Kobus Abo, MD     Brief Narrative:   [name] is an 58 y.o. 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  PT evaluated the patient recommended skilled nursing facility placement patient was found to lack medical decision capacity such that he is not amenable to determine his discharge disposition rule out to leave AGAINST MEDICAL ADVICE.  Over the last 1 week he has been noncompliant with his medication and refusing physical examination, he is also been refusing his oral meds.  TOC is pursuing guardianship with APS as he lacks medical decisional capacity.  Psychiatry presently recommending to await APS decision for guardianship before discharging home.  Assessment/Plan:   Acute CVA (cerebrovascular accident) [facility]) He underwent extensive workup and imaging revealed left MCA territory infarct, caudate nucleus, lentiform nucleus and internal capsule MRI confirmed. TEE was noted for large mobile left lower clot just under the mitral valve with an EF of 20% and a small right apical clot with reduced RV systolic function. He was started on Eliquis  for intracardiac thrombus the patient has been counseled multiple times about being compliant with his medication and the risk of recurrent CVA and possible death. Psychiatry has deemed the patient to lack capacity and resist compliant with care.   There is no safe way to force medication on him but he refuses to take it as a result nursing staff and  physician simply make multiple attempts throughout the day to convince the patient of the need to comply with his medical therapy.  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 Last 2D echo showed an EF of less than 20%. Continue Eliquis .  Schizoaffective disorder bipolar disorder lacks medical capacity: Psych was consulted and recommended that the patient lacks medical capacity to make decisions if the patient attempts to leave AGAINST MEDICAL ADVICE he should be involuntarily committed. TOC has contacted APS to pursue guardianship as patient lacks medical decision capacity. Psych is presently recommending to await APS guardianship prior to attempting discharging him. Use to take his meds over the last 3 to 4 days. I have talked to him today and he agreed to take his medication.  Back pain: Resolved.  Elevated blood pressure: Resume his current regimen but he has refused it over the last several days.  Hyperlipidemia: Continue Crestor .  Diabetes mellitus type 2: With last A1c of 5.4 continue to monitor CBGs.  Polysubstance abuse: UDS was positive for cocaine and benzos.  Deconditioning: Ambulatory status complicated by previous right AKA and left BKA has no insurance coverage for SNF.  History of COPD: Continue inhalers.  History of DVT: Continue Eliquis .   DVT prophylaxis: lovenox  Family Communication:none Status is: Inpatient Remains inpatient appropriate because: Waiting placement.    Code Status:     Code Status Orders  (From admission, onward)           Start     Ordered   03/04/24 1733  Full code  Continuous  Question:  By:  Answer:  Consent: discussion documented in EHR   03/04/24 1733           Code Status History     Date Active Date Inactive Code Status Order ID Comments User Context   01/14/2024 0224 01/18/2024 2101 Full Code  [id number]  Sabas Cradle, MD ED   01/02/2024 1725 01/06/2024 2117 Full Code [id number]  Lena Qualia, MD ED   12/20/2023 0601 12/22/2023 1832 Full Code [id number]  Walton Guppy, MD ED   10/22/2023 1835 10/24/2023 1159 Full Code [id number]  Merdis Stalling, MD ED   12/07/2022 1712 06/16/2023 1820 Full Code [id number]  Wilfred Hanlon, DO ED   11/24/2022 0745 12/03/2022 1902 Full Code [id number]  Danice Dural, MD ED   11/03/2022 0253 11/03/2022 1937 Full Code [id number]  Coretha Dew, PA-C ED   08/17/2022 0142 10/09/2022 2035 Full Code [id number]  Sabas Cradle, MD ED   07/12/2022 1026 07/25/2022 2113 Full Code [id number]  Danice Dural, MD ED   04/04/2022 1240 04/11/2022 1710 Full Code [id number]  Danice Dural, MD ED   04/03/2022 1411 04/04/2022 1240 Full Code [id number]  Elisa Guest, PA-C ED   03/23/2022 2112 03/31/2022 1517 Full Code [id number]  Etter Hermann., MD Inpatient   02/26/2022 1812 03/07/2022 1908 Full Code [id number]  Wilfred Hanlon, DO Inpatient   01/29/2022 1515 02/05/2022 1533 Full Code [id number]  Raymona Caldwell, MD ED   12/03/2021 0841 12/09/2021 1457 Full Code [id number]  True Fuss, MD ED   11/24/2021 2315 11/25/2021 2004 Full Code [id number]  Muthersbaugh, Lilia Reges ED   11/13/2021 1653 11/21/2021 1748 Full Code [id number]  Danice Dural, MD Inpatient   11/02/2021 2000 11/04/2021 2331 Full Code [id number]  Deatra Face, MD ED   07/03/2021 1143 07/05/2021 0057 Full Code [id number]  Rosealee Concha, MD ED   03/02/2021 1936 03/12/2021 1811 Full Code [id number]  Kenny Peals, MD ED   02/10/2021 2345 02/13/2021 1958 Full Code [id number]  Coretha Dew, PA-C ED   02/09/2021 1953 02/10/2021 1536 Full Code [id number]  Vance Gell, MD ED   12/17/2020 1516 02/03/2021 1650 Full Code [id number]  Glorianne Largo, NP ED   12/08/2020 1336 12/10/2020 1931 Full Code [id number]  Edsel Grace, MD ED   11/13/2020 2028 11/16/2020 1520 Full Code [id number]  Wilfred Hanlon, DO ED    10/26/2020 1039 10/31/2020 2157 Full Code [id number]  Karalee Oscar, MD ED   10/15/2020 0611 10/18/2020 1810 Full Code [id number]  Chotiner, Pauline Bos, MD Inpatient   10/02/2020 2247 10/07/2020 1809 Full Code [id number]  Walton Guppy, MD ED   09/25/2020 1559 09/26/2020 2002 Full Code [id number]  Lavanda Porter, MD ED   09/09/2020 1251 09/11/2020 1546 Full Code [id number]  Daren Eck, DO Inpatient   09/07/2020 1753 09/09/2020 1251 DNR [id number]  Wilfred Hanlon, DO Inpatient   07/21/2020 1700 07/23/2020 2001 Full Code [id number]  Karlyne Oxford, MD ED   07/07/2020 1529 07/07/2020 2109 Full Code [id number]  Arvilla Birmingham, MD ED   06/24/2019 0131 06/24/2019 1630 Full Code [id number]  Sharin [name], NP Inpatient   06/24/2019 0130 06/24/2019 0131 Full Code [id number]  Cortez Dines, NP Inpatient   06/21/2019 0941 06/22/2019 2007 Full Code [id number]  Patrica Bookman, DO Inpatient   05/24/2019 1117 05/26/2019 1836 Full Code [id number]  Massie Soles, MD ED   04/18/2019 0149 04/19/2019 1628 DNR [id number]  Mickael Alamo, MD ED   04/04/2019 0555 04/06/2019 1413 Full Code [id number]  Thersia Flax, MD ED   03/27/2019 2130 03/29/2019 1900 Full Code [id number]  Karlyne Oxford, MD ED   03/04/2019 1600 03/09/2019 1222 Full Code [id number]  Boby Bury, MD ED   02/13/2018 0439 02/14/2018 1333 Full Code [id number]  Earma Gloss, MD ED   12/26/2016 0547 12/29/2016 2021 Full Code [id number]  Clapacs, Elida Grounds, MD Inpatient   11/27/2016 1541 11/28/2016 1319 Full Code [id number]  Mirta Ammon, PA-C ED   10/29/2016 1415 10/31/2016 1150 Full Code [id number]  Mozell Arias, MD ED   09/06/2016 2230 09/07/2016 1350 Full Code [id number]  Toi Foster, MD ED   09/06/2016 0934 09/06/2016 1529 Full Code [id number]  Freeda Jerry, MD ED   09/02/2016 0045 09/04/2016 2127 Full Code [id number]  Franceen Inches, NP ED   07/30/2016 1357 08/06/2016 2031 Full Code [id number]  Albert Almas, FNP Inpatient   07/30/2016 0143 07/30/2016 1153 Full Code [id number]   Ballard Bongo, MD ED   05/30/2016 1622 06/05/2016 1454 Full Code [id number]  Ferrel Hsu, NP Inpatient   05/29/2016 1635 05/30/2016 1622 Full Code [id number]  Albertus Hughs, DO ED   12/02/2011 0245 12/12/2011 0720 Full Code [id number]  Lyna Sandhoff, PA ED         IV Access:   Peripheral IV   Procedures and diagnostic studies:   No results found.   Medical Consultants:   None.   Subjective:    [name] no complaints  Objective:    Vitals:   04/23/24 0859 04/23/24 2014 04/24/24 0018 04/24/24 0822  BP: (!) 142/102 (!) 138/94 (!) 119/98   Pulse: 70 68 71   Resp: 18 18 18    Temp:  97.6 F (36.4 C) (!) 97.5 F (36.4 C)   TempSrc:  Oral Oral   SpO2: 100% 98% 98% 100%  Weight:      Height:       SpO2: 100 % O2 Flow Rate (L/min): 2 L/min FiO2 (%): 28 %   Intake/Output Summary (Last 24 hours) at 04/24/2024 0934 Last data filed at 04/23/2024 2200 Gross per 24 hour  Intake 480 ml  Output 550 ml  Net -70 ml   Filed Weights   03/26/24 0431 03/28/24 0456 04/04/24 0500  Weight: 42.7 kg 47.5 kg 61.7 kg    Exam: General exam: In no acute distress. Respiratory system: Good air movement and clear to auscultation. Cardiovascular system: S1 & S2 heard, RRR. No JVD. Gastrointestinal system: Abdomen is nondistended, soft and nontender.  Extremities: Bilateral lower extremity BKA Skin: No rashes, lesions or ulcers Psychiatry: Judgement and insight appear normal. Mood & affect appropriate.    Data Reviewed:    Labs: Basic Metabolic Panel: No results for input(s): "NA", "K", "CL", "CO2", "GLUCOSE", "BUN", "CREATININE", "CALCIUM ", "MG", "PHOS" in the last 168 hours.  GFR Estimated Creatinine Clearance: 80 mL/min (by C-G formula based on SCr of 0.91 mg/dL). Liver Function Tests: No results for input(s): "AST", "ALT", "ALKPHOS", "BILITOT", "PROT", "ALBUMIN" in the last 168 hours. No results for input(s): "LIPASE", "AMYLASE" in the last 168 hours. No results  for input(s): "AMMONIA" in the last 168 hours. Coagulation profile No results for input(s): "INR", "PROTIME" in the last 168 hours. COVID-19 Labs  No results for input(s): "DDIMER", "FERRITIN", "LDH", "CRP" in the last 72 hours.  Lab Results  Component Value Date   SARSCOV2NAA NEGATIVE 01/13/2024   SARSCOV2NAA NEGATIVE 01/02/2024   SARSCOV2NAA NEGATIVE 12/20/2023  SARSCOV2NAA NEGATIVE 11/23/2022    CBC: No results for input(s): "WBC", "NEUTROABS", "HGB", "HCT", "MCV", "PLT" in the last 168 hours.  Cardiac Enzymes: No results for input(s): "CKTOTAL", "CKMB", "CKMBINDEX", "TROPONINI" in the last 168 hours. BNP (last 3 results) No results for input(s): "PROBNP" in the last 8760 hours. CBG: No results for input(s): "GLUCAP" in the last 168 hours. D-Dimer: No results for input(s): "DDIMER" in the last 72 hours. Hgb A1c: No results for input(s): "HGBA1C" in the last 72 hours. Lipid Profile: No results for input(s): "CHOL", "HDL", "LDLCALC", "TRIG", "CHOLHDL", "LDLDIRECT" in the last 72 hours. Thyroid  function studies: No results for input(s): "TSH", "T4TOTAL", "T3FREE", "THYROIDAB" in the last 72 hours.  Invalid input(s): "FREET3" Anemia work up: No results for input(s): "VITAMINB12", "FOLATE", "FERRITIN", "TIBC", "IRON", "RETICCTPCT" in the last 72 hours. Sepsis Labs: No results for input(s): "PROCALCITON", "WBC", "LATICACIDVEN" in the last 168 hours.  Microbiology No results found for this or any previous visit (from the past 240 hours).   Medications:    apixaban   5 mg Oral BID   carvedilol   6.25 mg Oral BID WC   furosemide   40 mg Oral Daily   gabapentin   200 mg Oral BID   losartan   50 mg Oral Daily   revefenacin   175 mcg Nebulization Daily   rosuvastatin   5 mg Oral QHS   senna-docusate  1 tablet Oral BID   spironolactone   12.5 mg Oral Daily   Continuous Infusions:    LOS: 51 days   [name]  [facility]  04/24/2024, 9:34 AM

## 2024-04-25 DIAGNOSIS — I639 Cerebral infarction, unspecified: Secondary | ICD-10-CM | POA: Diagnosis not present

## 2024-04-25 NOTE — Plan of Care (Signed)
  Problem: Education: Goal: Knowledge of disease or condition will improve Outcome: Adequate for Discharge Goal: Knowledge of secondary prevention will improve (MUST DOCUMENT ALL) Outcome: Adequate for Discharge Goal: Knowledge of patient specific risk factors will improve (DELETE if not current risk factor) Outcome: Adequate for Discharge   Problem: Ischemic Stroke/TIA Tissue Perfusion: Goal: Complications of ischemic stroke/TIA will be minimized Outcome: Adequate for Discharge   Problem: Coping: Goal: Will verbalize positive feelings about self Outcome: Adequate for Discharge Goal: Will identify appropriate support needs Outcome: Adequate for Discharge   Problem: Health Behavior/Discharge Planning: Goal: Ability to manage health-related needs will improve Outcome: Adequate for Discharge Goal: Goals will be collaboratively established with patient/family Outcome: Adequate for Discharge   Problem: Self-Care: Goal: Ability to participate in self-care as condition permits will improve Outcome: Adequate for Discharge Goal: Verbalization of feelings and concerns over difficulty with self-care will improve Outcome: Adequate for Discharge Goal: Ability to communicate needs accurately will improve Outcome: Adequate for Discharge   Problem: Nutrition: Goal: Risk of aspiration will decrease Outcome: Adequate for Discharge Goal: Dietary intake will improve Outcome: Adequate for Discharge

## 2024-04-25 NOTE — Progress Notes (Signed)
 [facility] PROGRESS NOTE    Progress Note  [name]  WUJ:[id number] DOB: [birth year] DOA: 03/04/2024 PCP: Shantice Menger Abo, MD     Brief Narrative:   [name] is an 58 y.o. 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  PT evaluated the patient recommended skilled nursing facility placement patient was found to lack medical decision capacity such that he is not amenable to determine his discharge disposition rule out to leave AGAINST MEDICAL ADVICE.  Over the last 1 week he has been noncompliant with his medication and refusing physical examination, he is also been refusing his oral meds.  TOC is pursuing guardianship with APS as he lacks medical decisional capacity.  Psychiatry presently recommending to await APS decision for guardianship before discharging home.  Assessment/Plan:   Acute CVA (cerebrovascular accident) [facility]) He underwent extensive workup and imaging revealed left MCA territory infarct, caudate nucleus, lentiform nucleus and internal capsule MRI confirmed. TEE was noted for large mobile left lower clot just under the mitral valve with an EF of 20% and a small right apical clot with reduced RV systolic function. He was started on Eliquis  for intracardiac thrombus the patient has been counseled multiple times about being compliant with his medication and the risk of recurrent CVA and possible death. Psychiatry has deemed the patient to lack capacity and resist compliant with care.   There is no safe way to force medication on him but he refuses to take it as a result nursing staff and  physician simply make multiple attempts throughout the day to convince the patient of the need to comply with his medical therapy.  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 Last 2D echo showed an EF of less than 20%. Continue Eliquis .  Schizoaffective disorder bipolar disorder lacks medical capacity: Psych was consulted and recommended that the patient lacks medical capacity to make decisions if the patient attempts to leave AGAINST MEDICAL ADVICE he should be involuntarily committed. TOC has contacted APS to pursue guardianship as patient lacks medical decision capacity. Psych is presently recommending to await APS guardianship prior to attempting discharging him. Use to take his meds over the last 3 to 4 days. I have talked to him today and he agreed to take his medication.  Back pain: Resolved.  Elevated blood pressure: Resume his current regimen but he has refused it over the last several days.  Hyperlipidemia: Continue Crestor .  Diabetes mellitus type 2: With last A1c of 5.4 continue to monitor CBGs.  Polysubstance abuse: UDS was positive for cocaine and benzos.  Deconditioning: Ambulatory status complicated by previous right AKA and left BKA has no insurance coverage for SNF.  History of COPD: Continue inhalers.  History of DVT: Continue Eliquis .   DVT prophylaxis: lovenox  Family Communication:none Status is: Inpatient Remains inpatient appropriate because: Waiting placement.    Code Status:     Code Status Orders  (From admission, onward)           Start     Ordered   03/04/24 1733  Full code  Continuous  Question:  By:  Answer:  Consent: discussion documented in EHR   03/04/24 1733           Code Status History     Date Active Date Inactive Code Status Order ID Comments User Context   01/14/2024 0224 01/18/2024 2101 Full Code  [id number]  [name] Cradle, MD ED   01/02/2024 1725 01/06/2024 2117 Full Code [id number]  [name] Qualia, MD ED   12/20/2023 0601 12/22/2023 1832 Full Code [id number]  [name] Guppy, MD ED   10/22/2023 1835 10/24/2023 1159 Full Code [id number]  [name] Stalling, MD ED   12/07/2022 1712 06/16/2023 1820 Full Code [id number]  [name] Hanlon, DO ED   11/24/2022 0745 12/03/2022 1902 Full Code [id number]  [name] Dural, MD ED   11/03/2022 0253 11/03/2022 1937 Full Code [id number]  [name] Dew, PA-C ED   08/17/2022 0142 10/09/2022 2035 Full Code [id number]  [name] Cradle, MD ED   07/12/2022 1026 07/25/2022 2113 Full Code [id number]  [name] Dural, MD ED   04/04/2022 1240 04/11/2022 1710 Full Code [id number]  [name] Dural, MD ED   04/03/2022 1411 04/04/2022 1240 Full Code [id number]  [name] Guest, PA-C ED   03/23/2022 2112 03/31/2022 1517 Full Code [id number]  [name]., MD Inpatient   02/26/2022 1812 03/07/2022 1908 Full Code [id number]  [name] Hanlon, DO Inpatient   01/29/2022 1515 02/05/2022 1533 Full Code [id number]  [name] Caldwell, MD ED   12/03/2021 0841 12/09/2021 1457 Full Code [id number]  [name] Fuss, MD ED   11/24/2021 2315 11/25/2021 2004 Full Code [id number]  [name], Lilia [name] ED   11/13/2021 1653 11/21/2021 1748 Full Code [id number]  [name] Dural, MD Inpatient   11/02/2021 2000 11/04/2021 2331 Full Code [id number]  [name] Face, MD ED   07/03/2021 1143 07/05/2021 0057 Full Code [id number]  [name] Concha, MD ED   03/02/2021 1936 03/12/2021 1811 Full Code [id number]  [name] Peals, MD ED   02/10/2021 2345 02/13/2021 1958 Full Code [id number]  [name] Dew, PA-C ED   02/09/2021 1953 02/10/2021 1536 Full Code [id number]  [name] Gell, MD ED   12/17/2020 1516 02/03/2021 1650 Full Code [id number]  [name] Largo, NP ED   12/08/2020 1336 12/10/2020 1931 Full Code [id number]  [name] Grace, MD ED   11/13/2020 2028 11/16/2020 1520 Full Code [id number]  [name] Hanlon, DO ED    10/26/2020 1039 10/31/2020 2157 Full Code [id number]  [name] Oscar, MD ED   10/15/2020 0611 10/18/2020 1810 Full Code [id number]  [name], Pauline Bos, MD Inpatient   10/02/2020 2247 10/07/2020 1809 Full Code [id number]  [name] Guppy, MD ED   09/25/2020 1559 09/26/2020 2002 Full Code [id number]  Lavanda Porter, MD ED   09/09/2020 1251 09/11/2020 1546 Full Code [id number]  Daren Eck, DO Inpatient   09/07/2020 1753 09/09/2020 1251 DNR [id number]  [name] Hanlon, DO Inpatient   07/21/2020 1700 07/23/2020 2001 Full Code [id number]  Karlyne Oxford, MD ED   07/07/2020 1529 07/07/2020 2109 Full Code [id number]  Arvilla Birmingham, MD ED   06/24/2019 0131 06/24/2019 1630 Full Code [id number]  Sharin [name], NP Inpatient   06/24/2019 0130 06/24/2019 0131 Full Code [id number]  Cortez Dines, NP Inpatient   06/21/2019 0941 06/22/2019 2007 Full Code [id number]  Patrica Bookman, DO Inpatient   05/24/2019 1117 05/26/2019 1836 Full Code [id number]  Massie Soles, MD ED   04/18/2019 0149 04/19/2019 1628 DNR [id number]  Mickael Alamo, MD ED   04/04/2019 0555 04/06/2019 1413 Full Code [id number]  Thersia Flax, MD ED   03/27/2019 2130 03/29/2019 1900 Full Code [id number]  Karlyne Oxford, MD ED   03/04/2019 1600 03/09/2019 1222 Full Code [id number]  Boby Bury, MD ED   02/13/2018 0439 02/14/2018 1333 Full Code [id number]  Earma Gloss, MD ED   12/26/2016 0547 12/29/2016 2021 Full Code [id number]  Clapacs, Elida Grounds, MD Inpatient   11/27/2016 1541 11/28/2016 1319 Full Code [id number]  Mirta Ammon, PA-C ED   10/29/2016 1415 10/31/2016 1150 Full Code [id number]  Mozell Arias, MD ED   09/06/2016 2230 09/07/2016 1350 Full Code [id number]  Toi Foster, MD ED   09/06/2016 0934 09/06/2016 1529 Full Code [id number]  Freeda Jerry, MD ED   09/02/2016 0045 09/04/2016 2127 Full Code [id number]  Franceen Inches, NP ED   07/30/2016 1357 08/06/2016 2031 Full Code [id number]  Albert Almas, FNP Inpatient   07/30/2016 0143 07/30/2016 1153 Full Code [id number]   Ballard Bongo, MD ED   05/30/2016 1622 06/05/2016 1454 Full Code [id number]  Ferrel Hsu, NP Inpatient   05/29/2016 1635 05/30/2016 1622 Full Code [id number]  Albertus Hughs, DO ED   12/02/2011 0245 12/12/2011 0720 Full Code [id number]  Lyna Sandhoff, PA ED         IV Access:   Peripheral IV   Procedures and diagnostic studies:   No results found.   Medical Consultants:   None.   Subjective:    Jadis Mika no complaints  Objective:    Vitals:   04/24/24 2115 04/25/24 0435 04/25/24 0728 04/25/24 0759  BP: (!) 134/91 (!) 155/104 (!) 134/99   Pulse: 68 79 83 82  Resp: 20 18 19 18   Temp:  97.6 F (36.4 C) (!) 97.5 F (36.4 C)   TempSrc:  Oral    SpO2:  98% 96%   Weight:      Height:       SpO2: 96 % O2 Flow Rate (L/min): 2 L/min FiO2 (%): 28 %   Intake/Output Summary (Last 24 hours) at 04/25/2024 0920 Last data filed at 04/24/2024 1800 Gross per 24 hour  Intake 480 ml  Output --  Net 480 ml   Filed Weights   03/26/24 0431 03/28/24 0456 04/04/24 0500  Weight: 42.7 kg 47.5 kg 61.7 kg    Exam: General exam: In no acute distress. Respiratory system: Good air movement and clear to auscultation. Cardiovascular system: S1 & S2 heard, RRR. No JVD. Gastrointestinal system: Abdomen is nondistended, soft and nontender.  Extremities: Bilateral lower extremity BKA Skin: No rashes, lesions or ulcers Psychiatry: Judgement and insight appear normal. Mood & affect appropriate.    Data Reviewed:    Labs: Basic Metabolic Panel: No results for input(s): "NA", "K", "CL", "CO2", "GLUCOSE", "BUN", "CREATININE", "CALCIUM ", "MG", "PHOS" in the last 168 hours.  GFR Estimated Creatinine Clearance: 80 mL/min (by C-G formula based on SCr of 0.91 mg/dL). Liver Function Tests: No results for input(s): "AST", "ALT", "ALKPHOS", "BILITOT", "PROT", "ALBUMIN" in the last 168 hours. No results for input(s): "LIPASE", "AMYLASE" in the last 168 hours. No results for input(s):  "AMMONIA" in the last 168 hours. Coagulation profile No results for input(s): "INR", "PROTIME" in the last 168 hours. COVID-19 Labs  No results for input(s): "DDIMER", "FERRITIN", "LDH", "CRP" in the last 72 hours.  Lab Results  Component Value Date   SARSCOV2NAA NEGATIVE 01/13/2024   SARSCOV2NAA NEGATIVE 01/02/2024   SARSCOV2NAA NEGATIVE 12/20/2023  SARSCOV2NAA NEGATIVE 11/23/2022    CBC: No results for input(s): "WBC", "NEUTROABS", "HGB", "HCT", "MCV", "PLT" in the last 168 hours.  Cardiac Enzymes: No results for input(s): "CKTOTAL", "CKMB", "CKMBINDEX", "TROPONINI" in the last 168 hours. BNP (last 3 results) No results for input(s): "PROBNP" in the last 8760 hours. CBG: No results for input(s): "GLUCAP" in the last 168 hours. D-Dimer: No results for input(s): "DDIMER" in the last 72 hours. Hgb A1c: No results for input(s): "HGBA1C" in the last 72 hours. Lipid Profile: No results for input(s): "CHOL", "HDL", "LDLCALC", "TRIG", "CHOLHDL", "LDLDIRECT" in the last 72 hours. Thyroid  function studies: No results for input(s): "TSH", "T4TOTAL", "T3FREE", "THYROIDAB" in the last 72 hours.  Invalid input(s): "FREET3" Anemia work up: No results for input(s): "VITAMINB12", "FOLATE", "FERRITIN", "TIBC", "IRON", "RETICCTPCT" in the last 72 hours. Sepsis Labs: No results for input(s): "PROCALCITON", "WBC", "LATICACIDVEN" in the last 168 hours.  Microbiology No results found for this or any previous visit (from the past 240 hours).   Medications:    apixaban   5 mg Oral BID   carvedilol   6.25 mg Oral BID WC   furosemide   40 mg Oral Daily   gabapentin   200 mg Oral BID   losartan   50 mg Oral Daily   revefenacin   175 mcg Nebulization Daily   rosuvastatin   5 mg Oral QHS   senna-docusate  1 tablet Oral BID   spironolactone   12.5 mg Oral Daily   Continuous Infusions:    LOS: 52 days   Macdonald Savoy  [facility]  04/25/2024, 9:20 AM

## 2024-04-25 NOTE — Progress Notes (Signed)
 Physical Therapy Treatment Patient Details Name: [name] MRN: [id number] DOB: 08-04-1966 Today's Date: 04/25/2024   History of Present Illness Patient is 58 y.o. male presented to ED on 03/04/24 with stroke like symptoms when caregiver found him with slurred speech and right sided weakness. Head CT reveals Acute left MCA territory infarct affecting the insula, caudate nucleus, lentiform nucleus and internal capsule. PMH significant of  Schizoaffective disorder, heart failure with reduced ejection fraction, pretension, DMII, COPD, s/p bilateral amputations, chronic pain, and polysubstance abuse.    PT Comments  Continuing work on functional mobility and activity tolerance;  [name] agreed to some exercise and work, and we opted to roll to [employer], where he is familiar with doing exercises during previous admissions; [name] performed 3 therabnd exercises for his R shouder, elbow, and hand, after this PT demonstrated the exercises;   After exercises, [name] was able to wash his hands thoroughly at the sink in the [employer] gym (where there is adequate space for him to access the sink while in wheelchair); in previous sessions, [name] commented that his R hand has been harder to care for;   Ended the session in [name]'s room -- revisited the week schedule; [name] mentioned a meeting for him tomorrow (but will still allow this PT to try a session if the meeting isn't happening), OT on Wednesday, ST on Thursday, and possibly going outside with [facility] Team on Friday; this PT updated the weekly schedule on the wall fo [name]   If plan is discharge home, recommend the following: Supervision due to cognitive status;Direct supervision/assist for financial management;Direct supervision/assist for medications management   Can travel by private vehicle      (perhaps soon)  Equipment Recommendations  None recommended by PT    Recommendations for Other Services OT consult;Speech consult     Precautions /  Restrictions Precautions Precautions: Fall Recall of Precautions/Restrictions: Impaired Precaution/Restrictions Comments: h/o BLE amputations Restrictions Weight Bearing Restrictions Per Provider Order: No     Mobility  Bed Mobility Overal bed mobility: Modified Independent Bed Mobility: Supine to Sit, Sit to Supine Rolling: Modified independent (Device/Increase time), Used rails              Transfers Overall transfer level: Needs assistance Equipment used: None Transfers: Bed to chair/wheelchair/BSC         Anterior-Posterior transfers: Supervision   General transfer comment: Supervision, though without the need to cue for brakes use; smooth transition with backing into wheelchair;  wc to bed, pt performed an anterior posterior transfer, starting with wc push up style boosting self forward in teh seat, then raching far forward on footboard, pulling self to tall-kneel-like position, and sitting back down onto the bed, with L hip making first contact to bed surface    Ambulation/Gait                   Psychologist, counselling propulsion: Both lower extermities Wheelchair parts: Supervision/cueing Distance: 100   Tilt Bed    Modified Rankin (Stroke Patients Only) Modified Rankin (Stroke Patients Only) Pre-Morbid Rankin Score: Slight disability Modified Rankin: Moderately severe disability     Balance                                            Communication Communication Communication: Impaired  Factors Affecting Communication: Difficulty expressing self;Reduced clarity of speech  Cognition Arousal: Alert Behavior During Therapy: WFL for tasks assessed/performed   PT - Cognitive impairments: Difficult to assess Difficult to assess due to: Impaired communication                     PT - Cognition Comments: 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; see also SLP session note from  today Following commands: Intact      Cueing Cueing Techniques: Verbal cues, Tactile cues, Gestural cues  Exercises Other Exercises Other Exercises: Red theraband: shoulder flexion, RUE x10 reps Other Exercises: Red theraband : R shoulder horizontal abduction with elbow extension, 10 reps Other Exercises: Red theraband: MCP joint extension, 1st through 5th rays, opening hand against resistence of theraband; 5 reps; incr tiem to get into position for this exercise    General Comments        Pertinent Vitals/Pain Pain Assessment Pain Assessment: Faces Faces Pain Scale: No hurt Pain Intervention(s): Monitored during session    Home Living                          Prior Function            PT Goals (current goals can now be found in the care plan section) Acute Rehab PT Goals Patient Stated Goal: would like to be able to get home PT Goal Formulation: With patient Time For Goal Achievement: 04/20/24 (Plan to address goals next session) Potential to Achieve Goals: Fair Progress towards PT goals: Progressing toward goals    Frequency    Min 2X/week      PT Plan      Co-evaluation              AM-PAC PT "6 Clicks" Mobility   Outcome Measure  Help needed turning from your back to your side while in a flat bed without using bedrails?: None Help needed moving from lying on your back to sitting on the side of a flat bed without using bedrails?: None Help needed moving to and from a bed to a chair (including a wheelchair)?: A Little Help needed standing up from a chair using your arms (e.g., wheelchair or bedside chair)?: Total Help needed to walk in hospital room?: Total Help needed climbing 3-5 steps with a railing? : Total 6 Click Score: 14    End of Session   Activity Tolerance: Patient tolerated treatment well Patient left: in bed;with call bell/phone within reach Nurse Communication: Mobility status PT Visit Diagnosis: Other abnormalities of  gait and mobility (R26.89);Muscle weakness (generalized) (M62.81);Other symptoms and signs involving the nervous system (R29.898);Hemiplegia and hemiparesis Hemiplegia - Right/Left: Right Hemiplegia - dominant/non-dominant: Dominant Hemiplegia - caused by: Cerebral infarction     Time: 1333-1400 PT Time Calculation (min) (ACUTE ONLY): 27 min  Charges:    $Therapeutic Exercise: 8-22 mins $Therapeutic Activity: 8-22 mins PT General Charges $$ ACUTE PT VISIT: 1 Visit                     Darcus Eastern, PT  Acute Rehabilitation Services Office [phone] Secure Chat welcomed    Marcial Setting 04/25/2024, 4:36 PM

## 2024-04-25 NOTE — Plan of Care (Signed)
   Problem: Education: Goal: Knowledge of disease or condition will improve Outcome: Progressing Goal: Knowledge of secondary prevention will improve (MUST DOCUMENT ALL) Outcome: Progressing Goal: Knowledge of patient specific risk factors will improve (DELETE if not current risk factor) Outcome: Progressing

## 2024-04-26 DIAGNOSIS — I639 Cerebral infarction, unspecified: Secondary | ICD-10-CM | POA: Diagnosis not present

## 2024-04-26 NOTE — Progress Notes (Signed)
 [facility] PROGRESS NOTE    Progress Note  [name]  ZOX:[id number] DOB: [birth year] DOA: 03/04/2024 PCP: Rakiyah Esch Abo, MD     Brief Narrative:   [name] is an 58 y.o. 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  PT evaluated the patient recommended skilled nursing facility placement patient was found to lack medical decision capacity such that he is not amenable to determine his discharge disposition rule out to leave AGAINST MEDICAL ADVICE.  Over the last 1 week he has been noncompliant with his medication and refusing physical examination, he is also been refusing his oral meds.  TOC is pursuing guardianship with APS as he lacks medical decisional capacity.  Psychiatry presently recommending to await APS decision for guardianship before discharging home.  Assessment/Plan:   Acute CVA (cerebrovascular accident) [facility]) He underwent extensive workup and imaging revealed left MCA territory infarct, caudate nucleus, lentiform nucleus and internal capsule MRI confirmed. TEE was noted for large mobile left lower clot just under the mitral valve with an EF of 20% and a small right apical clot with reduced RV systolic function. He was started on Eliquis  for intracardiac thrombus the patient has been counseled multiple times about being compliant with his medication and the risk of recurrent CVA and possible death. Psychiatry has deemed the patient to lack capacity and resist compliant with care.   There is no safe way to force medication on him but he refuses to take it as a result nursing staff and  physician simply make multiple attempts throughout the day to convince the patient of the need to comply with his medical therapy.  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 Last 2D echo showed an EF of less than 20%. Continue Eliquis .  Schizoaffective disorder bipolar disorder lacks medical capacity: Psych was consulted and recommended that the patient lacks medical capacity to make decisions if the patient attempts to leave AGAINST MEDICAL ADVICE he should be involuntarily committed. TOC has contacted APS to pursue guardianship as patient lacks medical decision capacity. Psych is presently recommending to await APS guardianship prior to attempting discharging him. Use to take his meds over the last 3 to 4 days. I have talked to him today and he agreed to take his medication.  Back pain: Resolved.  Elevated blood pressure: Resume his current regimen but he has refused it over the last several days.  Hyperlipidemia: Continue Crestor .  Diabetes mellitus type 2: With last A1c of 5.4 continue to monitor CBGs.  Polysubstance abuse: UDS was positive for cocaine and benzos.  Deconditioning: Ambulatory status complicated by previous right AKA and left BKA has no insurance coverage for SNF.  History of COPD: Continue inhalers.  History of DVT: Continue Eliquis .   DVT prophylaxis: lovenox  Family Communication:none Status is: Inpatient Remains inpatient appropriate because: Waiting placement.    Code Status:     Code Status Orders  (From admission, onward)           Start     Ordered   03/04/24 1733  Full code  Continuous  Question:  By:  Answer:  Consent: discussion documented in EHR   03/04/24 1733           Code Status History     Date Active Date Inactive Code Status Order ID Comments User Context   01/14/2024 0224 01/18/2024 2101 Full Code  [id number]  Sabas Cradle, MD ED   01/02/2024 1725 01/06/2024 2117 Full Code [id number]  Lena Qualia, MD ED   12/20/2023 0601 12/22/2023 1832 Full Code [id number]  Walton Guppy, MD ED   10/22/2023 1835 10/24/2023 1159 Full Code [id number]  Merdis Stalling, MD ED   12/07/2022 1712 06/16/2023 1820 Full Code [id number]  Wilfred Hanlon, DO ED   11/24/2022 0745 12/03/2022 1902 Full Code [id number]  Danice Dural, MD ED   11/03/2022 0253 11/03/2022 1937 Full Code [id number]  Coretha Dew, PA-C ED   08/17/2022 0142 10/09/2022 2035 Full Code [id number]  Sabas Cradle, MD ED   07/12/2022 1026 07/25/2022 2113 Full Code [id number]  Danice Dural, MD ED   04/04/2022 1240 04/11/2022 1710 Full Code [id number]  Danice Dural, MD ED   04/03/2022 1411 04/04/2022 1240 Full Code [id number]  Elisa Guest, PA-C ED   03/23/2022 2112 03/31/2022 1517 Full Code [id number]  Etter Hermann., MD Inpatient   02/26/2022 1812 03/07/2022 1908 Full Code [id number]  Wilfred Hanlon, DO Inpatient   01/29/2022 1515 02/05/2022 1533 Full Code [id number]  [name] Caldwell, MD ED   12/03/2021 0841 12/09/2021 1457 Full Code [id number]  True Fuss, MD ED   11/24/2021 2315 11/25/2021 2004 Full Code [id number]  Muthersbaugh, Lilia Reges ED   11/13/2021 1653 11/21/2021 1748 Full Code [id number]  Danice Dural, MD Inpatient   11/02/2021 2000 11/04/2021 2331 Full Code [id number]  Deatra Face, MD ED   07/03/2021 1143 07/05/2021 0057 Full Code [id number]  Rosealee Concha, MD ED   03/02/2021 1936 03/12/2021 1811 Full Code [id number]  Kenny Peals, MD ED   02/10/2021 2345 02/13/2021 1958 Full Code [id number]  Coretha Dew, PA-C ED   02/09/2021 1953 02/10/2021 1536 Full Code [id number]  Vance Gell, MD ED   12/17/2020 1516 02/03/2021 1650 Full Code [id number]  Glorianne Largo, NP ED   12/08/2020 1336 12/10/2020 1931 Full Code [id number]  Edsel Grace, MD ED   11/13/2020 2028 11/16/2020 1520 Full Code [id number]  Wilfred Hanlon, DO ED    10/26/2020 1039 10/31/2020 2157 Full Code [id number]  Karalee Oscar, MD ED   10/15/2020 0611 10/18/2020 1810 Full Code [id number]  Chotiner, Pauline Bos, MD Inpatient   10/02/2020 2247 10/07/2020 1809 Full Code [id number]  Walton Guppy, MD ED   09/25/2020 1559 09/26/2020 2002 Full Code [id number]  Lavanda Porter, MD ED   09/09/2020 1251 09/11/2020 1546 Full Code [id number]  Daren Eck, DO Inpatient   09/07/2020 1753 09/09/2020 1251 DNR [id number]  Wilfred Hanlon, DO Inpatient   07/21/2020 1700 07/23/2020 2001 Full Code [id number]  Karlyne Oxford, MD ED   07/07/2020 1529 07/07/2020 2109 Full Code [id number]  Arvilla Birmingham, MD ED   06/24/2019 0131 06/24/2019 1630 Full Code [id number]  Sharin [name], NP Inpatient   06/24/2019 0130 06/24/2019 0131 Full Code [id number]  Cortez Dines, NP Inpatient   06/21/2019 0941 06/22/2019 2007 Full Code [id number]  Patrica Bookman, DO Inpatient   05/24/2019 1117 05/26/2019 1836 Full Code [id number]  Massie Soles, MD ED   04/18/2019 0149 04/19/2019 1628 DNR [id number]  Mickael Alamo, MD ED   04/04/2019 0555 04/06/2019 1413 Full Code [id number]  Thersia Flax, MD ED   03/27/2019 2130 03/29/2019 1900 Full Code [id number]  Karlyne Oxford, MD ED   03/04/2019 1600 03/09/2019 1222 Full Code [id number]  Boby Bury, MD ED   02/13/2018 0439 02/14/2018 1333 Full Code [id number]  Earma Gloss, MD ED   12/26/2016 0547 12/29/2016 2021 Full Code [id number]  Clapacs, Elida Grounds, MD Inpatient   11/27/2016 1541 11/28/2016 1319 Full Code [id number]  Mirta Ammon, PA-C ED   10/29/2016 1415 10/31/2016 1150 Full Code [id number]  Mozell Arias, MD ED   09/06/2016 2230 09/07/2016 1350 Full Code [id number]  Toi Foster, MD ED   09/06/2016 0934 09/06/2016 1529 Full Code [id number]  Freeda Jerry, MD ED   09/02/2016 0045 09/04/2016 2127 Full Code [id number]  Franceen Inches, NP ED   07/30/2016 1357 08/06/2016 2031 Full Code [id number]  Albert Almas, FNP Inpatient   07/30/2016 0143 07/30/2016 1153 Full Code [id number]   Ballard Bongo, MD ED   05/30/2016 1622 06/05/2016 1454 Full Code [id number]  Ferrel Hsu, NP Inpatient   05/29/2016 1635 05/30/2016 1622 Full Code [id number]  Albertus Hughs, DO ED   12/02/2011 0245 12/12/2011 0720 Full Code [id number]  Lyna Sandhoff, PA ED         IV Access:   Peripheral IV   Procedures and diagnostic studies:   No results found.   Medical Consultants:   None.   Subjective:    [name] no complaints  Objective:    Vitals:   04/25/24 1547 04/25/24 2027 04/26/24 0726 04/26/24 0751  BP: (!) 146/114 126/84  (!) 158/102  Pulse: 75 65 68 75  Resp: 18 19 18 17   Temp: (!) 97.5 F (36.4 C) 97.6 F (36.4 C)    TempSrc:      SpO2: 100% 95%  96%  Weight:      Height:       SpO2: 96 % O2 Flow Rate (L/min): 2 L/min FiO2 (%): 28 %   Intake/Output Summary (Last 24 hours) at 04/26/2024 0930 Last data filed at 04/25/2024 1200 Gross per 24 hour  Intake 240 ml  Output --  Net 240 ml   Filed Weights   03/26/24 0431 03/28/24 0456 04/04/24 0500  Weight: 42.7 kg 47.5 kg 61.7 kg    Exam: General exam: In no acute distress. Respiratory system: Good air movement and clear to auscultation. Cardiovascular system: S1 & S2 heard, RRR. No JVD. Gastrointestinal system: Abdomen is nondistended, soft and nontender.  Extremities: Bilateral lower extremity BKA Skin: No rashes, lesions or ulcers Psychiatry: Judgement and insight appear normal. Mood & affect appropriate.    Data Reviewed:    Labs: Basic Metabolic Panel: No results for input(s): "NA", "K", "CL", "CO2", "GLUCOSE", "BUN", "CREATININE", "CALCIUM ", "MG", "PHOS" in the last 168 hours.  GFR Estimated Creatinine Clearance: 80 mL/min (by C-G formula based on SCr of 0.91 mg/dL). Liver Function Tests: No results for input(s): "AST", "ALT", "ALKPHOS", "BILITOT", "PROT", "ALBUMIN" in the last 168 hours. No results for input(s): "LIPASE", "AMYLASE" in the last 168 hours. No results for input(s):  "AMMONIA" in the last 168 hours. Coagulation profile No results for input(s): "INR", "PROTIME" in the last 168 hours. COVID-19 Labs  No results for input(s): "DDIMER", "FERRITIN", "LDH", "CRP" in the last 72 hours.  Lab Results  Component Value Date   SARSCOV2NAA NEGATIVE 01/13/2024   SARSCOV2NAA NEGATIVE 01/02/2024   SARSCOV2NAA NEGATIVE 12/20/2023  SARSCOV2NAA NEGATIVE 11/23/2022    CBC: No results for input(s): "WBC", "NEUTROABS", "HGB", "HCT", "MCV", "PLT" in the last 168 hours.  Cardiac Enzymes: No results for input(s): "CKTOTAL", "CKMB", "CKMBINDEX", "TROPONINI" in the last 168 hours. BNP (last 3 results) No results for input(s): "PROBNP" in the last 8760 hours. CBG: No results for input(s): "GLUCAP" in the last 168 hours. D-Dimer: No results for input(s): "DDIMER" in the last 72 hours. Hgb A1c: No results for input(s): "HGBA1C" in the last 72 hours. Lipid Profile: No results for input(s): "CHOL", "HDL", "LDLCALC", "TRIG", "CHOLHDL", "LDLDIRECT" in the last 72 hours. Thyroid  function studies: No results for input(s): "TSH", "T4TOTAL", "T3FREE", "THYROIDAB" in the last 72 hours.  Invalid input(s): "FREET3" Anemia work up: No results for input(s): "VITAMINB12", "FOLATE", "FERRITIN", "TIBC", "IRON", "RETICCTPCT" in the last 72 hours. Sepsis Labs: No results for input(s): "PROCALCITON", "WBC", "LATICACIDVEN" in the last 168 hours.  Microbiology No results found for this or any previous visit (from the past 240 hours).   Medications:    apixaban   5 mg Oral BID   carvedilol   6.25 mg Oral BID WC   furosemide   40 mg Oral Daily   gabapentin   200 mg Oral BID   losartan   50 mg Oral Daily   revefenacin   175 mcg Nebulization Daily   rosuvastatin   5 mg Oral QHS   senna-docusate  1 tablet Oral BID   spironolactone   12.5 mg Oral Daily   Continuous Infusions:    LOS: 53 days   Macdonald Savoy  [facility]  04/26/2024, 9:30 AM

## 2024-04-26 NOTE — TOC Progression Note (Signed)
 Transition of Care [facility]) - Progression Note    Patient Details  Name: [name] MRN: [id number] Date of Birth: [birth year]  [facility]) CM/SW Contact  Tandy Fam, [name] Phone Number: 04/26/2024, 4:23 PM  Clinical Narrative:   CSW spoke with Josefina Nian with APS about update on guardianship and possible hearing. Josefina Nian indicated that he had coordinated with the patient's caregiver, Brian Campanile, who was agreeable to file for guardianship over the patient so APS has closed the case.   CSW contacted Brian Campanile to confirm that she has completed paperwork for guardianship. Brian Campanile indicated that she had received paperwork from [place] and just got it submitted today, she has not been assigned a court date but will update CSW when she receives one. Brian Campanile also asking for updates on disposition options for the patient, as he is asking when he can come home. CSW explained to Brian Campanile that the patient will need 24/7 supervision and increased assistance due to cognitive deficits from this stroke, and Brian Campanile was not aware of that but indicated understanding. Patient does not currently have 24 hour assistance at home, Brian Campanile is only with the patient 2.5 hours per day, but she can contact her agency to see if she can get increased hours. Brian Campanile said she can also reach out to [facility] and [name] to see if there are any other options for getting the patient more assistance at home.  CSW spoke with Sherrlyn Dolores with [name] to provide update on guardianship and the patient's caregiver filing for guardianship. Sherrlyn Dolores was unaware that the caregiver had decided to pursue guardianship, was appreciative of update. Sherrlyn Dolores updated CSW that the patient is continuing to refuse to provide the keys to his apartment for his wheelchair and prosthesis to be brought to the hospital to assist with his therapy sessions. Sherrlyn Dolores also asked for CSW email to send her a copy of the patient's eviction notice to review.   CSW coordinated  with PT, OT, and SLP to discuss disposition options for the patient. Therapy to continue to work with patient to determine exact deficits that will require supervision and assistance at home to see if it is possible to arrange additional supports to get patient home instead of a facility. CSW to follow.    Expected Discharge Plan: Skilled Nursing Facility Barriers to Discharge: Continued Medical Work up, English as a second language teacher, Active Substance Use - Placement, Inadequate or no insurance, SNF Pending payor source - LOG, SNF Pending Medicaid, SNF Pending bed offer  Expected Discharge Plan and Services       Living arrangements for the past 2 months: Apartment Expected Discharge Date: 03/14/24                                     Social Determinants of Health (SDOH) Interventions SDOH Screenings   Food Insecurity: Food Insecurity Present (03/06/2024)  Housing: High Risk (03/06/2024)  Transportation Needs: No Transportation Needs (03/06/2024)  Utilities: Patient Declined (03/06/2024)  Alcohol Screen: Medium Risk (06/24/2019)  Depression (PHQ2-9): Low Risk  (02/18/2024)  Recent Concern: Depression (PHQ2-9) - Medium Risk (01/21/2024)  Financial Resource Strain: High Risk (07/24/2021)   Received from [facility]  Physical Activity: Inactive (02/18/2024)  Social Connections: Unknown (04/05/2022)   Received from [facility], [facility]  Stress: Stress Concern Present (07/24/2021)   Received from [facility], [facility]  Tobacco Use: High Risk (03/04/2024)  Health Literacy: Adequate  Health Literacy (02/18/2024)    Readmission Risk Interventions    12/21/2023    9:25 AM 12/09/2022    9:02 AM 12/01/2022    3:56 PM  Readmission Risk Prevention Plan  Transportation Screening Complete Complete Complete  HRI or Home Care Consult Complete    Social Work Consult for Recovery Care Planning/Counseling Complete    Palliative Care Screening Not Applicable    Medication Review  Oceanographer) Complete Complete Complete  PCP or Specialist appointment within 3-5 days of discharge  Complete Complete  HRI or Home Care Consult  Complete Complete  SW Recovery Care/Counseling Consult  Complete Complete  Palliative Care Screening  Not Applicable Not Applicable  Skilled Nursing Facility  Not Applicable Not Applicable  SNF Comments   n/a

## 2024-04-27 DIAGNOSIS — I639 Cerebral infarction, unspecified: Secondary | ICD-10-CM | POA: Diagnosis not present

## 2024-04-27 NOTE — Progress Notes (Signed)
 PROGRESS NOTE    [name]  FAO:[id number] DOB: November 08, 1966 DOA: 03/04/2024 PCP: Abraham Abo, MD   Brief Narrative: [name] is an 58 y.o. male with a history of cardiomyopathy, cocaine abuse comes into the ED on 03/04/2024 for slurred speech, confusion and right-sided weakness, CT of the head showed left MCA infarct, CTA of the head and neck revealed an M2 occlusion on the left, echocardiogram revealed large mobile left ventricular thrombus on the left and a right ventricular apical thrombus on the right, EF was less than 20%.  Cardiology was consulted recommended a DOAC.  PT evaluated the patient recommended skilled nursing facility placement.  Patient was found to lack medical decision capacity such that he is not amenable to determine his discharge disposition or  leave AGAINST MEDICAL ADVICE.    Over the last 1 week he has been noncompliant with his medication and refusing physical examination, he is also been refusing his oral meds.  TOC is pursuing guardianship with APS as he lacks medical decisional capacity.  Psychiatry presently recommending to await APS decision for guardianship before discharging home.    Assessment & Plan:   Principal Problem:   Acute CVA (cerebrovascular accident) (HCC) Active Problems:   LV (left ventricular) mural thrombus   Chronic systolic CHF (congestive heart failure) (HCC)   Prolonged QT interval   Elevated liver enzymes   COPD (chronic obstructive pulmonary disease) (HCC)   Cocaine abuse (HCC)   Schizoaffective disorder, bipolar type (HCC)   Benign essential HTN   Acid reflux   History of DVT (deep vein thrombosis)   Type 2 diabetes mellitus (HCC)   Status post bilateral BKA.   Dilated cardiomyopathy (HCC)  Acute CVA (cerebrovascular accident) (HCC) -He underwent extensive workup and imaging revealed left MCA territory infarct, caudate nucleus, lentiform nucleus and internal capsule MRI confirmed. -TEE was noted for large mobile left  lower clot just under the mitral valve with an EF of 20% and a small right apical clot with reduced RV systolic function. -He was started on Eliquis  for intracardiac thrombus the patient has been counseled multiple times about being compliant with his medication and the risk of recurrent CVA and possible death. -Psychiatry has deemed the patient to lack capacity and resist compliant with care.   -There is no safe way to force medication on him but he refuses to take it as a result nursing staff and physician simply make multiple attempts throughout the day to convince the patient of the need to comply with his medical therapy.   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 Last 2D echo showed an EF of less than 20%. -Continue Eliquis .   Schizoaffective disorder bipolar disorder lacks medical capacity: Psych was consulted and recommended that the patient lacks medical capacity to make decisions if the patient attempts to leave AGAINST MEDICAL ADVICE he should be involuntarily committed. -TOC has contacted APS to pursue guardianship as patient lacks medical decision capacity. -Psych is presently recommending to await APS guardianship prior to attempting discharging him.    Back pain: Resolved.   Elevated blood pressure: -Continue losartan  and carvedilol  -Continue spironolactone   Hyperlipidemia: Continue Crestor .   Diabetes mellitus type 2:  last A1c of 5.4 continue to monitor CBGs.   Polysubstance abuse: UDS was positive for cocaine and benzos.   Deconditioning: Ambulatory status complicated by previous right AKA and left BKA has no insurance coverage for SNF.   History of COPD: Continue inhalers.  History of DVT: Continue Eliquis .    Estimated body mass index is 20.09 kg/m as calculated from the following:   Height as of this encounter: 5\' 9"  (1.753 m).   Weight as of this encounter:  61.7 kg.   DVT prophylaxis: Eliquis  Code Status: Full code Family Communication: No family at bedside Disposition Plan:  Status is: Inpatient Remains inpatient appropriate because: Awaiting safe disposition plan    Consultants:  Psych Cardiology Neurology  Procedures:    Antimicrobials:    Subjective: He is alert, sitting in the bed, he does not want me to perform physical examination  Objective: Vitals:   04/26/24 1320 04/26/24 2001 04/27/24 0744 04/27/24 1241  BP: 99/81 (!) 130/99 (!) 134/97 (!) 126/94  Pulse: 76 80 74 87  Resp: 17 18 19 18   Temp:  97.9 F (36.6 C) (!) 97.3 F (36.3 C) 97.7 F (36.5 C)  TempSrc:   Axillary   SpO2: 100% 98% 98% 97%  Weight:      Height:        Intake/Output Summary (Last 24 hours) at 04/27/2024 1501 Last data filed at 04/26/2024 2215 Gross per 24 hour  Intake 240 ml  Output --  Net 240 ml   Filed Weights   03/26/24 0431 03/28/24 0456 04/04/24 0500  Weight: 42.7 kg 47.5 kg 61.7 kg    Examination: Declined physical examination General: Alert Lungs: Normal respiratory effort  Data Reviewed: I have personally reviewed following labs and imaging studies  CBC: No results for input(s): "WBC", "NEUTROABS", "HGB", "HCT", "MCV", "PLT" in the last 168 hours. Basic Metabolic Panel: No results for input(s): "NA", "K", "CL", "CO2", "GLUCOSE", "BUN", "CREATININE", "CALCIUM ", "MG", "PHOS" in the last 168 hours. GFR: Estimated Creatinine Clearance: 80 mL/min (by C-G formula based on SCr of 0.91 mg/dL). Liver Function Tests: No results for input(s): "AST", "ALT", "ALKPHOS", "BILITOT", "PROT", "ALBUMIN" in the last 168 hours. No results for input(s): "LIPASE", "AMYLASE" in the last 168 hours. No results for input(s): "AMMONIA" in the last 168 hours. Coagulation Profile: No results for input(s): "INR", "PROTIME" in the last 168 hours. Cardiac Enzymes: No results for input(s): "CKTOTAL", "CKMB", "CKMBINDEX", "TROPONINI" in the last  168 hours. BNP (last 3 results) No results for input(s): "PROBNP" in the last 8760 hours. HbA1C: No results for input(s): "HGBA1C" in the last 72 hours. CBG: No results for input(s): "GLUCAP" in the last 168 hours. Lipid Profile: No results for input(s): "CHOL", "HDL", "LDLCALC", "TRIG", "CHOLHDL", "LDLDIRECT" in the last 72 hours. Thyroid  Function Tests: No results for input(s): "TSH", "T4TOTAL", "FREET4", "T3FREE", "THYROIDAB" in the last 72 hours. Anemia Panel: No results for input(s): "VITAMINB12", "FOLATE", "FERRITIN", "TIBC", "IRON", "RETICCTPCT" in the last 72 hours. Sepsis Labs: No results for input(s): "PROCALCITON", "LATICACIDVEN" in the last 168 hours.  No results found for this or any previous visit (from the past 240 hours).       Radiology Studies: No results found.      Scheduled Meds:  apixaban   5 mg Oral BID   carvedilol   6.25 mg Oral BID WC   furosemide   40 mg Oral Daily   gabapentin   200 mg Oral BID   losartan   50 mg Oral Daily   revefenacin   175 mcg Nebulization Daily   rosuvastatin   5 mg Oral QHS   senna-docusate  1 tablet Oral BID   spironolactone   12.5 mg Oral Daily   Continuous Infusions:   LOS: 54 days    Time spent: 35 minutes  Danette Duos, MD [facility]   If 7PM-7AM, please contact night-coverage [email or web address]  04/27/2024, 3:01 PM

## 2024-04-27 NOTE — Plan of Care (Signed)
  Problem: Education: Goal: Knowledge of disease or condition will improve Outcome: Progressing Goal: Knowledge of secondary prevention will improve (MUST DOCUMENT ALL) Outcome: Progressing Goal: Knowledge of patient specific risk factors will improve (DELETE if not current risk factor) Outcome: Progressing   Problem: Ischemic Stroke/TIA Tissue Perfusion: Goal: Complications of ischemic stroke/TIA will be minimized Outcome: Progressing   Problem: Coping: Goal: Will verbalize positive feelings about self Outcome: Progressing Goal: Will identify appropriate support needs Outcome: Progressing   Problem: Health Behavior/Discharge Planning: Goal: Ability to manage health-related needs will improve Outcome: Progressing Goal: Goals will be collaboratively established with patient/family Outcome: Progressing   Problem: Self-Care: Goal: Ability to participate in self-care as condition permits will improve Outcome: Progressing Goal: Verbalization of feelings and concerns over difficulty with self-care will improve Outcome: Progressing Goal: Ability to communicate needs accurately will improve Outcome: Progressing   Problem: Nutrition: Goal: Risk of aspiration will decrease Outcome: Progressing Goal: Dietary intake will improve Outcome: Progressing

## 2024-04-27 NOTE — Progress Notes (Signed)
 Yupelri  nebulizer pulled by RN from pyxis.

## 2024-04-27 NOTE — Progress Notes (Signed)
 Occupational Therapy Treatment Patient Details Name: [name] MRN: [id number] DOB: [birth year] Today's Date: 04/27/2024   History of present illness Patient is 58 y.o. male presented to ED on 03/04/24 with stroke like symptoms when caregiver found him with slurred speech and right sided weakness. Head CT reveals Acute left MCA territory infarct affecting the insula, caudate nucleus, lentiform nucleus and internal capsule. PMH significant of  Schizoaffective disorder, heart failure with reduced ejection fraction, pretension, DMII, COPD, s/p bilateral amputations, chronic pain, and polysubstance abuse.   OT comments  Pt with fair progression toward established goals this session with goals updated per POC. Pt initially hesitant to participation due to anticipation OT would come later in the day but when re-oriented to compensatory memory strategy of calendar that Wednesdays are for OT and no time written, agreeable. Pt eager to visit 5N this session as he has previously been a pt here. Pt propelling wheelchair a total of 1/2 of the way there and back with rest breaks throughout. Pt minimally receptive to cues for proprioception of RUE to achieve  ROM equal to LUE during propulsion, but is improving with repetitive practice as well as positive reinforcement. Working on Therapist, art with Rns familiar to pt from previous admissions. Pt needing mod cues for path finding to and from 5[facility], but improved when returning to 3w. Pt reports he is agreeable to potentially going outside and working on RUE with theraband next session as OT working with pt to prioritize personal needs and goals today while implementing OT goals into this.       If plan is discharge home, recommend the following:  A little help with walking and/or transfers;A little help with bathing/dressing/bathroom;Supervision due to cognitive status   Equipment Recommendations  None recommended by OT    Recommendations for  Other Services      Precautions / Restrictions Precautions Precautions: Fall Recall of Precautions/Restrictions: Impaired Precaution/Restrictions Comments: h/o BLE amputations Restrictions Weight Bearing Restrictions Per Provider Order: No       Mobility Bed Mobility Overal bed mobility: Modified Independent                  Transfers Overall transfer level: Needs assistance Equipment used: None Transfers: Bed to chair/wheelchair/BSC         Anterior-Posterior transfers: Supervision   General transfer comment: increased time to self-implement safety strategies (brakes), but no cues needed     Balance Overall balance assessment: Needs assistance Sitting-balance support: Bilateral upper extremity supported Sitting balance-Leahy Scale: Good                                     ADL either performed or assessed with clinical judgement   ADL Overall ADL's : Needs assistance/impaired     Grooming: Wash/dry hands;Independent;Sitting Grooming Details (indicate cue type and reason): chair level at sink                             Functional mobility during ADLs: Supervision/safety;Wheelchair      Extremity/Trunk Assessment Upper Extremity Assessment Upper Extremity Assessment: Right hand dominant RUE Deficits / Details: decreased proprioception, neuromuscular endurance, and strength noted when attempting to propel wheelchair, however, noting greater length of reach to self propel this session more similar to LUE ROM for the propulsion; not sustained with fatigue RUE Coordination: decreased fine motor;decreased gross motor  Lower Extremity Assessment Lower Extremity Assessment: Defer to PT evaluation        Vision       Perception     Praxis     Communication Communication Communication: Impaired Factors Affecting Communication: Difficulty expressing self;Reduced clarity of speech   Cognition Arousal: Alert Behavior During  Therapy: [facility] for tasks assessed/performed Cognition: Cognition impaired Difficult to assess due to: Impaired communication Orientation impairments:  (day of week inconsistent but knows tomorrow is his birthday) Awareness: Online awareness impaired, Intellectual awareness intact (reduced anticipation of obstacles despite knowling R side is affected, but does okay with navigating (may bump into door frame etc in narrow areas but able to self correct once error has occurred (light nudge on arm)) Memory impairment (select all impairments): Short-term memory, Working memory Attention impairment (select first level of impairment): Selective attention Executive functioning impairment (select all impairments): Reasoning, Problem solving, Organization, Sequencing                   Following commands: Intact        Cueing   Cueing Techniques: Verbal cues, Tactile cues, Gestural cues  Exercises      Shoulder Instructions       General Comments      Pertinent Vitals/ Pain       Pain Assessment Pain Assessment: No/denies pain  Home Living                                          Prior Functioning/Environment              Frequency  Min 1X/week        Progress Toward Goals  OT Goals(current goals can now be found in the care plan section)  Progress towards OT goals: Progressing toward goals  Acute Rehab OT Goals Patient Stated Goal: go to 5N OT Goal Formulation: With patient Time For Goal Achievement: 05/10/24 Potential to Achieve Goals: Fair  Plan      Co-evaluation                 AM-PAC OT "6 Clicks" Daily Activity     Outcome Measure   Help from another person eating meals?: None Help from another person taking care of personal grooming?: A Little Help from another person toileting, which includes using toliet, bedpan, or urinal?: A Lot Help from another person bathing (including washing, rinsing, drying)?: A Little Help from  another person to put on and taking off regular upper body clothing?: A Little Help from another person to put on and taking off regular lower body clothing?: A Little 6 Click Score: 18    End of Session Equipment Utilized During Treatment: Other (comment) (wheelchair)  OT Visit Diagnosis: Unsteadiness on feet (R26.81);Other abnormalities of gait and mobility (R26.89);Muscle weakness (generalized) (M62.81);Other symptoms and signs involving the nervous system (R29.898);Other symptoms and signs involving cognitive function;Hemiplegia and hemiparesis Hemiplegia - Right/Left: Right Hemiplegia - dominant/non-dominant: Dominant Hemiplegia - caused by: Cerebral infarction   Activity Tolerance Patient tolerated treatment well   Patient Left in bed;with call bell/phone within reach   Nurse Communication Mobility status        Time: 4332-9518 OT Time Calculation (min): 56 min  Charges: OT General Charges $OT Visit: 1 Visit OT Treatments $Self Care/Home Management : 8-22 mins $Therapeutic Activity: 23-37 mins  [name], OTD, OTR/L [facility] Acute Rehabilitation Office: (  [phone]   [name] 04/27/2024, 5:54 PM

## 2024-04-28 ENCOUNTER — Other Ambulatory Visit (HOSPITAL_COMMUNITY): Payer: Self-pay

## 2024-04-28 ENCOUNTER — Telehealth (HOSPITAL_COMMUNITY): Payer: Self-pay | Admitting: Pharmacy Technician

## 2024-04-28 DIAGNOSIS — I639 Cerebral infarction, unspecified: Secondary | ICD-10-CM | POA: Diagnosis not present

## 2024-04-28 MED ORDER — SENNA 8.6 MG PO TABS
1.0000 | ORAL_TABLET | Freq: Every day | ORAL | Status: DC
  Filled 2024-04-28 (×4): qty 1

## 2024-04-28 MED ORDER — CARVEDILOL PHOSPHATE ER 10 MG PO CP24
10.0000 mg | ORAL_CAPSULE | Freq: Every day | ORAL | Status: DC
  Administered 2024-04-29 – 2024-08-17 (×97): 10 mg via ORAL
  Filled 2024-04-28 (×116): qty 1

## 2024-04-28 MED ORDER — RIVAROXABAN 20 MG PO TABS
20.0000 mg | ORAL_TABLET | Freq: Every day | ORAL | Status: DC
  Administered 2024-04-29 – 2024-06-24 (×44): 20 mg via ORAL
  Filled 2024-04-28 (×52): qty 1

## 2024-04-28 NOTE — Progress Notes (Signed)
 Speech Language Pathology Treatment: Cognitive-Linquistic  Patient Details Name: [name] MRN: [id number] DOB: [birth year] Today's Date: 04/28/2024 Time: 2130-8657 SLP Time Calculation (min) (ACUTE ONLY): 10 min  Assessment / Plan / Recommendation Clinical Impression  Pt participated in brief therapy session today. He was agreeable to using menu to call and order his lunch. He used the room phone independently, knowing how to turn it on to make a call and dialing the number for the cafeteria from memory. He looked through the menu and ordered a made-to-order sandwich, dessert, and beverage with Min A from SLP in deciphering the menu (was reading what was there, but not aware of headers to know what category he was looking at). He successfully ordered his lunch and it appeared as though the listener on the other end understood him well over the phone, as pt was not asked to repeat himself at any point. When asked what he wanted to do after this, he remembered listening to music with SLP last time and initially wanted to do that again, but he shut down quickly and did not engage in task. Goals updated this session - will continue to see pt although reduced to 1x/week as he is participating, although in limited fashion.   HPI HPI: Patient is 58 y.o. male presented to ED on 03/04/24 with stroke like symptoms when caregiver found him with slurred speech and right sided weakness. Head CT reveals Acute left MCA territory infarct affecting the insula, caudate nucleus, lentiform nucleus and internal capsule. Pt initially evaluated by SLP 4/5 but s/o 4/22 due to repeated refusals. SLP reordered 5/13 for repeat cognitive-linguistic eval.   PMH significant of  Schizoaffective disorder, heart failure with reduced ejection fraction, pretension, DMII, COPD, s/p bilateral amputations, chronic pain, and polysubstance abuse.      SLP Plan  Continue with current plan of care      Recommendations for follow up  therapy are one component of a multi-disciplinary discharge planning process, led by the attending physician.  Recommendations may be updated based on patient status, additional functional criteria and insurance authorization.    Recommendations                         Intermittent Supervision/Assistance (assistance with meds, meals, appointments) Dysarthria and anarthria (R47.1);Aphasia (R47.01)     Continue with current plan of care     Beth Brooke., M.A. CCC-SLP Acute Rehabilitation Services Office: [phone]  Secure chat preferred   04/28/2024, 12:21 PM

## 2024-04-28 NOTE — Progress Notes (Signed)
 Patient refused shift assessment, nightly medications - RN educated patient on the importance of taking medications, patient still refused, stated that he has taken all the medications he needs for the day.  Patient also refused to have vital signs and blood draw for labs.

## 2024-04-28 NOTE — Progress Notes (Addendum)
 PROGRESS NOTE [name]  WUJ:[id number] DOB: 02/04/1966 DOA: 03/04/2024 PCP: Abraham Abo, MD   Brief Narrative: [name] is an 58 y.o. male with a history of cardiomyopathy, cocaine abuse comes into the ED on 03/04/2024 for slurred speech, confusion and right-sided weakness, CT of the head showed left MCA infarct, CTA of the head and neck revealed an M2 occlusion on the left, echocardiogram revealed large mobile left ventricular thrombus on the left and a right ventricular apical thrombus on the right, EF was less than 20%.  Cardiology was consulted recommended a DOAC.  PT evaluated the patient recommended skilled nursing facility placement.  Patient was found to lack medical decision capacity such that he is not amenable to determine his discharge disposition or leave AGAINST MEDICAL ADVICE.    Over the last 1 week he has been noncompliant with his medication and refusing physical examination, he is also been refusing his oral meds.  TOC is pursuing guardianship with APS as he lacks medical decisional capacity.  Psychiatry presently recommending to await APS decision for guardianship before discharging home.   Assessment & Plan:   Principal Problem:   Acute CVA (cerebrovascular accident) (HCC) Active Problems:   LV (left ventricular) mural thrombus   Chronic systolic CHF (congestive heart failure) (HCC)   Prolonged QT interval   Elevated liver enzymes   COPD (chronic obstructive pulmonary disease) (HCC)   Cocaine abuse (HCC)   Schizoaffective disorder, bipolar type (HCC)   Benign essential HTN   Acid reflux   History of DVT (deep vein thrombosis)   Type 2 diabetes mellitus (HCC)   Status post bilateral BKA.   Dilated cardiomyopathy (HCC)  Acute CVA (cerebrovascular accident) (HCC) -He underwent extensive workup and imaging revealed left MCA territory infarct, caudate nucleus, lentiform nucleus and internal capsule MRI confirmed. -TEE was noted for large mobile left lower clot  just under the mitral valve with an EF of 20% and a small right apical clot with reduced RV systolic function. -He was started on Eliquis  for intracardiac thrombus the patient has been counseled multiple times about being compliant with his medication and the risk of recurrent CVA and possible death. Due to poor adherence, transitioned him to xarelto as he has been agreeable to taking medications once per day intermittently so reducing dosing frequency will hopefully be more consistent therapy.  -Psychiatry has deemed the patient to lack capacity and resist compliant with care.   -There is no safe way to force medication on him but he refuses to take it as a result nursing staff and physician simply make multiple attempts throughout the day to convince the patient of the need to comply with his medical therapy.   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 Last 2D echo showed an EF of less than 20%. -eliquis  to xarelto today to hopefully support better adherence with the daily dosing.  - also changed carvedilol  to 24hr tablet so that if he agrees to only take medications in the morning he will at least get recommended dosing. Has been refusing evening meds per chart review   Schizoaffective disorder bipolar disorder lacks medical capacity: Psych was consulted and recommended that the patient lacks medical capacity to make decisions if the patient attempts to leave AGAINST MEDICAL ADVICE he should be involuntarily committed. -TOC has contacted APS to pursue guardianship as patient lacks medical decision capacity. -Psych is presently recommending to await APS guardianship prior to attempting discharging him.  Back pain: did not endorse back pain today nor participate in encounter today   Elevated blood pressure: non-adherence consistently to medications reduces effectiveness. BP 151/94. Might be a good candidate  for chlorthalidone which has a longer half life so that it may be longer effective if he is intermittently accepting meds.  -Current therapy: losartan , spironolactone .  and carvedilol  was changed from BID to daily dosing  Hyperlipidemia: Continue Crestor .   Diabetes mellitus type 2:  last A1c of 5.4. refuses monitoring    Polysubstance abuse: UDS was positive for cocaine and benzos.   Deconditioning: Ambulatory status complicated by previous right AKA and left BKA has no insurance coverage for SNF. - refusing therapy   History of COPD: Continue inhalers.   History of DVT: Continue Eliquis .   Estimated body mass index is 20.09 kg/m as calculated from the following:   Height as of this encounter: 5\' 9"  (1.753 m).   Weight as of this encounter: 61.7 kg.   DVT prophylaxis: xarelto  Code Status: Full code Family Communication: No family at bedside Disposition Plan:  Status is: Inpatient Remains inpatient appropriate because: Awaiting safe disposition plan  Consultants:  Psych Cardiology Neurology   Subjective: He is alert, laying in bed watching tv. Refuses to engage in conversation. Tried to engage him in conversation about it being his birthday today and he would not look at me or respond to conversation.   Objective: Vitals:   04/26/24 2001 04/27/24 0744 04/27/24 1241 04/27/24 1729  BP: (!) 130/99 (!) 134/97 (!) 126/94 (!) 151/94  Pulse: 80 74 87 77  Resp: 18 19 18    Temp:  (!) 97.3 F (36.3 C) 97.7 F (36.5 C)   TempSrc:  Axillary    SpO2: 98% 98% 97% 96%  Weight:      Height:        Intake/Output Summary (Last 24 hours) at 04/28/2024 1142 Last data filed at 04/28/2024 1112 Gross per 24 hour  Intake --  Output 900 ml  Net -900 ml   Filed Weights   03/26/24 0431 03/28/24 0456 04/04/24 0500  Weight: 42.7 kg 47.5 kg 61.7 kg    Examination: Declined physical examination General: Alert Lungs: Normal respiratory effort  Data Reviewed: I have  personally reviewed labs and imaging studies  Scheduled Meds:  apixaban   5 mg Oral BID   carvedilol   10 mg Oral Daily   furosemide   40 mg Oral Daily   gabapentin   200 mg Oral BID   losartan   50 mg Oral Daily   revefenacin   175 mcg Nebulization Daily   rivaroxaban  20 mg Oral Q supper   rosuvastatin   5 mg Oral QHS   senna  1 tablet Oral Daily   spironolactone   12.5 mg Oral Daily     LOS: 55 days    Time spent: 35 minutes    Ree Candy, MD [facility]   If 7PM-7AM, please contact night-coverage [email or web address]  04/28/2024, 11:42 AM

## 2024-04-28 NOTE — Progress Notes (Signed)
 Patient refusing assessments, all medications and VS. MD notified.

## 2024-04-28 NOTE — Progress Notes (Signed)
 Pharmacy Consult for Xarelto   Indication: multi-chamber intracardiac thrombus   Allergies  Allergen Reactions   Acetaminophen  Hives, Nausea And Vomiting, Rash and Anaphylaxis    PT states that if he takes this he is going to the hospital   Motrin  [Ibuprofen ] Anaphylaxis   Nsaids Anaphylaxis    PT states that if he takes this he is going to the hospital   Lyrica  Cr [Pregabalin  Er] Other (See Comments)    Dystonia Dysarthria    Peanut-Containing Drug Products Other (See Comments)    Pt denies allergy to peanut butter 03/02/2021    Patient Measurements: Height: 5\' 9"  (175.3 cm) Weight:  (refused) IBW/kg (Calculated) : 70.7 HEPARIN  DW (KG): 77  Vital Signs:    Labs: No results for input(s): "HGB", "HCT", "PLT", "APTT", "LABPROT", "INR", "HEPARINUNFRC", "HEPRLOWMOCWT", "CREATININE", "CKTOTAL", "CKMB", "TROPONINI" in the last 72 hours.  Estimated Creatinine Clearance: 79.1 mL/min (by C-G formula based on SCr of 0.91 mg/dL).  Assessment: [name] a 58 y.o. male with large multichamber intracardiac thrombus. Patient is frequently noncompliant with medications. Largely has been compliant with Eliquis , though he missed a dose last evening (5/28). Pharmacy has been consulted to transition to Xarelto  in hopes of better compliance.    Plan:  Last dose of apixaban  this AM if possible START Xarelto  20 mg Qsupper  Monitor CBC periodically   Chrystie Crass, PharmD Clinical Pharmacist  04/28/2024 8:33 AM

## 2024-04-28 NOTE — Telephone Encounter (Signed)
 Pharmacy Patient Advocate Encounter  Insurance verification completed.    The patient is insured through [place] [employer]ICA[employer].     Ran test claim for XARELTO 20MG  TABLETS and was unable to determine a price as his insurance is out of network.   This test claim was processed through [facility]- copay amounts may vary at other pharmacies due to pharmacy/plan contracts, or as the patient moves through the different stages of their insurance plan.

## 2024-04-29 DIAGNOSIS — I639 Cerebral infarction, unspecified: Secondary | ICD-10-CM | POA: Diagnosis not present

## 2024-04-29 NOTE — Progress Notes (Signed)
 [name] appears upset and declined for VS and medication this AM shooing nurse away verbalizing "I don't want it". Will continue to round again to f/u.

## 2024-04-29 NOTE — Progress Notes (Signed)
 Patient refused to have vital signs to be taken multiple times tonight.  Also refused all medications.

## 2024-04-29 NOTE — Progress Notes (Signed)
 Patient continue to declined daily medication and VS with rounds. Will continue to f/u.

## 2024-04-29 NOTE — Progress Notes (Signed)
 PROGRESS NOTE [name]  XBJ:[id number] DOB: Feb 09, 1966 DOA: 03/04/2024 PCP: Abraham Abo, MD   Brief Narrative: [name] is an 58 y.o. male with a history of cardiomyopathy, cocaine abuse comes into the ED on 03/04/2024 for slurred speech, confusion and right-sided weakness, CT of the head showed left MCA infarct, CTA of the head and neck revealed an M2 occlusion on the left, echocardiogram revealed large mobile left ventricular thrombus on the left and a right ventricular apical thrombus on the right, EF was less than 20%.  Cardiology was consulted recommended a DOAC.  PT evaluated the patient recommended skilled nursing facility placement.  Patient was found to lack medical decision capacity such that he is not amenable to determine his discharge disposition or leave AGAINST MEDICAL ADVICE.    Over the last 1 week he has been noncompliant with his medication and refusing physical examination, he is also been refusing his oral meds.  TOC is pursuing guardianship with APS as he lacks medical decisional capacity.  Psychiatry presently recommending to await APS decision for guardianship before discharging home.   Assessment & Plan:   Principal Problem:   Acute CVA (cerebrovascular accident) (HCC) Active Problems:   LV (left ventricular) mural thrombus   Chronic systolic CHF (congestive heart failure) (HCC)   Prolonged QT interval   Elevated liver enzymes   COPD (chronic obstructive pulmonary disease) (HCC)   Cocaine abuse (HCC)   Schizoaffective disorder, bipolar type (HCC)   Benign essential HTN   Acid reflux   History of DVT (deep vein thrombosis)   Type 2 diabetes mellitus (HCC)   Status post bilateral BKA.   Dilated cardiomyopathy (HCC)  Acute CVA (cerebrovascular accident) (HCC) -He underwent extensive workup and imaging revealed left MCA territory infarct, caudate nucleus, lentiform nucleus and internal capsule MRI confirmed. -TEE was noted for large mobile left lower clot  just under the mitral valve with an EF of 20% and a small right apical clot with reduced RV systolic function. -He was started on Eliquis  for intracardiac thrombus the patient has been counseled multiple times about being compliant with his medication and the risk of recurrent CVA and possible death. Due to poor adherence, transitioned him to xarelto  as he has been agreeable to taking medications once per day intermittently so reducing dosing frequency will hopefully be more consistent therapy.  -Psychiatry has deemed the patient to lack capacity and resist compliant with care.   -There is no safe way to force medication on him but he refuses to take it as a result nursing staff and physician simply make multiple attempts throughout the day to convince the patient of the need to comply with his medical therapy.   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 Last 2D echo showed an EF of less than 20%. -eliquis  to xarelto   to hopefully support better adherence with the daily dosing.  - also changed carvedilol  to 24hr tablet so that if he agrees to only take medications in the morning he will at least get recommended dosing. Has been refusing evening meds per chart review   Schizoaffective disorder bipolar disorder lacks medical capacity: Psych was consulted and recommended that the patient lacks medical capacity to make decisions if the patient attempts to leave AGAINST MEDICAL ADVICE he should be involuntarily committed. -TOC has contacted APS to pursue guardianship as patient lacks medical decision capacity. -Psych is presently recommending to await APS guardianship prior to attempting discharging him.  Back pain: did not endorse back pain today nor participate in encounter today   Elevated blood pressure: non-adherence consistently to medications reduces effectiveness. BP 151/94. Might be a good candidate for  chlorthalidone which has a longer half life so that it may be longer effective if he is intermittently accepting meds.  -Current therapy: losartan , spironolactone .  and carvedilol  was changed from BID to daily dosing  Hyperlipidemia: Continue Crestor .   Diabetes mellitus type 2:  last A1c of 5.4. refuses monitoring    Polysubstance abuse: UDS was positive for cocaine and benzos.   Deconditioning: Ambulatory status complicated by previous right AKA and left BKA has no insurance coverage for SNF. - refusing therapy   History of COPD: Continue inhalers.   History of DVT: Continue xarelto  Estimated body mass index is 20.09 kg/m as calculated from the following:   Height as of this encounter: 5\' 9"  (1.753 m).   Weight as of this encounter: 61.7 kg.   DVT prophylaxis: xarelto  Code Status: Full code Family Communication: No family at bedside Disposition Plan:  Status is: Inpatient Remains inpatient appropriate because: Awaiting safe disposition plan  Consultants:  Psych Cardiology Neurology  Subjective: He is alert, sitting in bed and appears to be on hold on the phone looking at papers in his bed. Refused to look at me or speak to me  Objective: Vitals:   04/27/24 1241 04/27/24 1729 04/29/24 0445 04/29/24 0740  BP: (!) 126/94 (!) 151/94 (!) 129/103   Pulse: 87 77 77   Resp: 18  19   Temp:   98 F (36.7 C)   TempSrc:      SpO2: 97% 96% 100% 96%  Weight:      Height:        Intake/Output Summary (Last 24 hours) at 04/29/2024 0756 Last data filed at 04/29/2024 0230 Gross per 24 hour  Intake 240 ml  Output 1300 ml  Net -1060 ml   Filed Weights   03/26/24 0431 03/28/24 0456 04/04/24 0500  Weight: 42.7 kg 47.5 kg 61.7 kg    Examination: Declined physical examination General: Alert Lungs: Normal respiratory effort  Data Reviewed: I have personally reviewed labs and imaging studies  Scheduled Meds:  carvedilol   10 mg Oral Daily   furosemide   40 mg Oral  Daily   gabapentin   200 mg Oral BID   losartan   50 mg Oral Daily   revefenacin   175 mcg Nebulization Daily   rivaroxaban  20 mg Oral Q supper   rosuvastatin   5 mg Oral QHS   senna  1 tablet Oral Daily   spironolactone   12.5 mg Oral Daily     LOS: 56 days    Time spent: 35 minutes    Ree Candy, MD [facility]   If 7PM-7AM, please contact night-coverage [email or web address]  04/29/2024, 7:56 AM

## 2024-04-29 NOTE — Progress Notes (Signed)
   04/29/24 1515  Spiritual Encounters  Type of Visit Follow up  Care provided to: Patient  Reason for visit Routine spiritual support  OnCall Visit No  Spiritual Framework  Presenting Themes Meaning/purpose/sources of inspiration;Goals in life/care;Coping tools;Impactful experiences and emotions  Values/beliefs Belief in God and Jesus Christ (though not necessarily the Bible)  Community/Connection Limited  Strengths Motivated to recover and be discharged  Needs/Challenges/Barriers Ability to communicate; housing difficulties; skepticism surrounding the intentions of the medical staff  Patient Stress Factors Health changes;Loss of control;Lack of caregivers;Major life changes;Financial concerns  Goals  Self/Personal Goals Access his personal bank account; move back into his apartment  Clinical Care Goals Meet with social worker, specifically "Sheryle Donning in the ED"  Interventions  Spiritual Care Interventions Made Compassionate presence;Reflective listening;Reconciliation with self/others;Explored values/beliefs/practices/strengths;Prayer;Encouragement  Intervention Outcomes  Outcomes Reduced isolation;Reduced anxiety  Spiritual Care Plan  Spiritual Care Issues Still Outstanding Chaplain will continue to follow    Chaplain followed-up with pt as indicated above. Pt acknowledged his frustrations and stated he "just needs to take a deep breath." He is skeptical of the medical staff at times, feeling that some or many of the medications are meant just to "knock him out." At other times he is open to it as long as he knows and understands what the medications are for.  Pt invited prayer, and chaplain will follow-up again with pt on Monday per his request.  Chaplain debriefed Nurse Juventino Oppenheim afterwards, who stated she would try to get in touch with the requested social worker if possible.

## 2024-04-30 DIAGNOSIS — I639 Cerebral infarction, unspecified: Secondary | ICD-10-CM | POA: Diagnosis not present

## 2024-04-30 NOTE — Plan of Care (Signed)
  Problem: Education: Goal: Knowledge of disease or condition will improve Outcome: Progressing Goal: Knowledge of secondary prevention will improve (MUST DOCUMENT ALL) Outcome: Progressing Goal: Knowledge of patient specific risk factors will improve (DELETE if not current risk factor) Outcome: Progressing   Problem: Ischemic Stroke/TIA Tissue Perfusion: Goal: Complications of ischemic stroke/TIA will be minimized Outcome: Progressing   Problem: Coping: Goal: Will verbalize positive feelings about self Outcome: Progressing Goal: Will identify appropriate support needs Outcome: Progressing   Problem: Health Behavior/Discharge Planning: Goal: Ability to manage health-related needs will improve Outcome: Progressing Goal: Goals will be collaboratively established with patient/family Outcome: Progressing   Problem: Self-Care: Goal: Ability to participate in self-care as condition permits will improve Outcome: Progressing Goal: Verbalization of feelings and concerns over difficulty with self-care will improve Outcome: Progressing Goal: Ability to communicate needs accurately will improve Outcome: Progressing   Problem: Nutrition: Goal: Risk of aspiration will decrease Outcome: Progressing Goal: Dietary intake will improve Outcome: Progressing

## 2024-04-30 NOTE — Progress Notes (Signed)
 PROGRESS NOTE [name]  [id number]:[id number] DOB: Apr 20, 1966 DOA: 03/04/2024 PCP: Abraham Abo, MD   Brief Narrative: [name] is an 58 y.o. male with a history of cardiomyopathy, cocaine abuse comes into the ED on 03/04/2024 for slurred speech, confusion and right-sided weakness, CT of the head showed left MCA infarct, CTA of the head and neck revealed an M2 occlusion on the left, echocardiogram revealed large mobile left ventricular thrombus on the left and a right ventricular apical thrombus on the right, EF was less than 20%.  Cardiology was consulted recommended a DOAC.  PT evaluated the patient recommended skilled nursing facility placement.  Patient was found to lack medical decision capacity such that he is not amenable to determine his discharge disposition or leave AGAINST MEDICAL ADVICE.    Over the last 1 week he has been noncompliant with his medication and refusing physical examination, he is also been refusing his oral meds.  TOC is pursuing guardianship with APS as he lacks medical decisional capacity.  Psychiatry presently recommending to await APS decision for guardianship before discharging home.  Patient does not qualify for forced medication administration.  Assessment & Plan:   Principal Problem:   Acute CVA (cerebrovascular accident) (HCC) Active Problems:   LV (left ventricular) mural thrombus   Chronic systolic CHF (congestive heart failure) (HCC)   Prolonged QT interval   Elevated liver enzymes   COPD (chronic obstructive pulmonary disease) (HCC)   Cocaine abuse (HCC)   Schizoaffective disorder, bipolar type (HCC)   Benign essential HTN   Acid reflux   History of DVT (deep vein thrombosis)   Type 2 diabetes mellitus (HCC)   Status post bilateral BKA.   Dilated cardiomyopathy (HCC)  Acute CVA (cerebrovascular accident) (HCC) -He underwent extensive workup and imaging revealed left MCA territory infarct, caudate nucleus, lentiform nucleus and internal capsule  MRI confirmed. -TEE was noted for large mobile left lower clot just under the mitral valve with an EF of 20% and a small right apical clot with reduced RV systolic function. -He was started on Eliquis  for intracardiac thrombus, however he remained noncompliant.  In order to improve compliance, medicine changed to Xarelto  for once daily dosing.  -Psychiatry has deemed the patient to lack capacity and resist compliant with care.   -There is no safe way to force medication on him but he refuses to take it as a result nursing staff and physician simply make multiple attempts throughout the day to convince the patient of the need to comply with his medical therapy. -Does not qualify for forced medication.   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 Last 2D echo showed an EF of less than 20%. -eliquis  to xarelto   to hopefully support better adherence with the daily dosing.  - also changed carvedilol  to 24hr tablet so that if he agrees to only take medications in the morning he will at least get recommended dosing. Has been refusing most of the medications.   Schizoaffective disorder bipolar disorder lacks medical capacity: Psych was consulted and recommended that the patient lacks medical capacity to make decisions if the patient attempts to leave AGAINST MEDICAL ADVICE he should be involuntarily committed. -TOC has contacted APS to pursue guardianship as patient lacks medical decision capacity. -Psych is presently recommending to await APS guardianship prior to attempting discharging him.  Essential hypertension: Blood pressure is stable today.  Current therapy: losartan , spironolactone .  and carvedilol  was changed from BID to daily  dosing.  Hyperlipidemia: Continue Crestor .   Diabetes mellitus type 2:  last A1c of 5.4. refuses monitoring    Polysubstance abuse: UDS was positive for cocaine and benzos.    Deconditioning: Ambulatory status complicated by previous right AKA and left BKA has no insurance coverage for SNF. - refusing therapy   History of COPD: Continue inhalers.   History of DVT: Continue xarelto   Estimated body mass index is 20.09 kg/m as calculated from the following:   Height as of this encounter: 5\' 9"  (1.753 m).   Weight as of this encounter: 61.7 kg.   DVT prophylaxis: xarelto   Code Status: Full code Family Communication: No family at bedside Disposition Plan:  Status is: Inpatient Remains inpatient appropriate because: Awaiting safe disposition plan  Consultants:  Psych Cardiology Neurology  Subjective:  Patient seen and examined.  He is laying in bed, looks comfortable.  Refuses to talk.  On room air.  Objective: Vitals:   04/29/24 0740 04/29/24 1514 04/29/24 2025 04/29/24 2102  BP:  110/87 138/88 (!) 132/104  Pulse:  88 79 76  Resp:  18 16   Temp:      TempSrc:      SpO2: 96% 98% 100% 99%  Weight:      Height:        Intake/Output Summary (Last 24 hours) at 04/30/2024 1133 Last data filed at 04/30/2024 0800 Gross per 24 hour  Intake 720 ml  Output 800 ml  Net -80 ml   Filed Weights   03/26/24 0431 03/28/24 0456 04/04/24 0500  Weight: 42.7 kg 47.5 kg 61.7 kg    Examination: Declined physical examination Looks alert and awake.  Refuses to talk or respond.  Looks fairly comfortable. General: Alert Lungs: Normal respiratory effort on inspection.  Data Reviewed: I have personally reviewed labs and imaging studies  Scheduled Meds:  carvedilol   10 mg Oral Daily   furosemide   40 mg Oral Daily   gabapentin   200 mg Oral BID   losartan   50 mg Oral Daily   revefenacin   175 mcg Nebulization Daily   rivaroxaban   20 mg Oral Q supper   rosuvastatin   5 mg Oral QHS   senna  1 tablet Oral Daily   spironolactone   12.5 mg Oral Daily     LOS: 57 days    Time spent: 35 minutes    Vada Garibaldi, MD [facility]  04/30/2024,  11:33 AM

## 2024-05-01 DIAGNOSIS — I639 Cerebral infarction, unspecified: Secondary | ICD-10-CM | POA: Diagnosis not present

## 2024-05-01 NOTE — Progress Notes (Signed)
 PROGRESS NOTE [name]  [id number]:[id number] DOB: May 10, 1966 DOA: 03/04/2024 PCP: [name] Abo, MD   Brief Narrative: [name] is an 58 y.o. male with a history of cardiomyopathy, cocaine abuse comes into the ED on 03/04/2024 for slurred speech, confusion and right-sided weakness, CT of the head showed left MCA infarct, CTA of the head and neck revealed an M2 occlusion on the left, echocardiogram revealed large mobile left ventricular thrombus on the left and a right ventricular apical thrombus on the right, EF was less than 20%.  Cardiology was consulted recommended a DOAC.  PT evaluated the patient recommended skilled nursing facility placement.  Patient was found to lack medical decision capacity such that he is not amenable to determine his discharge disposition or leave AGAINST MEDICAL ADVICE.    Over the last 1 week he has been noncompliant with his medication and refusing physical examination, he is also been refusing his oral meds.  TOC is pursuing guardianship with APS as he lacks medical decisional capacity.  Psychiatry presently recommending to await APS decision for guardianship before discharging home.  Patient does not qualify for forced medication administration.  Assessment & Plan:   Principal Problem:   Acute CVA (cerebrovascular accident) (HCC) Active Problems:   LV (left ventricular) mural thrombus   Chronic systolic CHF (congestive heart failure) (HCC)   Prolonged QT interval   Elevated liver enzymes   COPD (chronic obstructive pulmonary disease) (HCC)   Cocaine abuse (HCC)   Schizoaffective disorder, bipolar type (HCC)   Benign essential HTN   Acid reflux   History of DVT (deep vein thrombosis)   Type 2 diabetes mellitus (HCC)   Status post bilateral BKA.   Dilated cardiomyopathy (HCC)  Acute CVA (cerebrovascular accident) (HCC) -He underwent extensive workup and imaging revealed left MCA territory infarct, caudate nucleus, lentiform nucleus and internal capsule  MRI confirmed. -TEE was noted for large mobile left lower clot just under the mitral valve with an EF of 20% and a small right apical clot with reduced RV systolic function. -He was started on Eliquis  for intracardiac thrombus, however he remained noncompliant.  In order to improve compliance, medicine changed to Xarelto  for once daily dosing.  -Psychiatry has deemed the patient to lack capacity and resist compliant with care.   -There is no safe way to force medication on him but he refuses to take it as a result nursing staff and physician simply make multiple attempts throughout the day to convince the patient of the need to comply with his medical therapy. -Does not qualify for forced medication.   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 Last 2D echo showed an EF of less than 20%. -eliquis  to xarelto   to hopefully support better adherence with the daily dosing.  - also changed carvedilol  to 24hr tablet so that if he agrees to only take medications in the morning he will at least get recommended dosing. Has been refusing most of the medications.   Schizoaffective disorder bipolar disorder lacks medical capacity: Psych was consulted and recommended that the patient lacks medical capacity to make decisions if the patient attempts to leave AGAINST MEDICAL ADVICE he should be involuntarily committed. -TOC has contacted APS to pursue guardianship as patient lacks medical decision capacity. -Psych is presently recommending to await APS guardianship prior to attempting discharging him.  Essential hypertension: Blood pressure is stable today.  Current therapy: losartan , spironolactone .  and carvedilol  was changed from BID to daily  dosing.  Hyperlipidemia: Continue Crestor .   Diabetes mellitus type 2:  last A1c of 5.4. refuses monitoring    Polysubstance abuse: UDS was positive for cocaine and benzos.    Deconditioning: Ambulatory status complicated by previous right AKA and left BKA has no insurance coverage for SNF. - refusing therapy   History of COPD: Continue inhalers.   History of DVT: Continue xarelto   Estimated body mass index is 20.09 kg/m as calculated from the following:   Height as of this encounter: 5\' 9"  (1.753 m).   Weight as of this encounter: 61.7 kg.   DVT prophylaxis: xarelto   Code Status: Full code Family Communication: No family at bedside Disposition Plan:  Status is: Inpatient Remains inpatient appropriate because: Awaiting safe disposition plan  Consultants:  Psych Cardiology Neurology  Subjective:  Patient seen and examined.  He looks very comfortable.  He is listening to TV remote in his ears and would not like to talk to us .  I asked him whether I can examine him and he said no.  Does not engage in conversation however nursing reported that he was on the phone talking nonstop.  Objective: Vitals:   04/30/24 1544 04/30/24 2024 04/30/24 2303 05/01/24 0827  BP: 113/76 (!) 139/102 (!) 153/81 (!) 142/94  Pulse: 76 69 65 65  Resp: 18 18 18 18   Temp:   98 F (36.7 C)   TempSrc:      SpO2: 96% 97% 99% 100%  Weight:      Height:        Intake/Output Summary (Last 24 hours) at 05/01/2024 1106 Last data filed at 04/30/2024 1700 Gross per 24 hour  Intake 480 ml  Output 550 ml  Net -70 ml   Filed Weights   03/26/24 0431 03/28/24 0456 04/04/24 0500  Weight: 42.7 kg 47.5 kg 61.7 kg    Examination: Declined physical examination.  Looks comfortable. Looks alert and awake.  Refuses to talk or respond.  General: Alert Lungs: Normal respiratory effort on inspection. Bilateral leg amputations.  Data Reviewed: I have personally reviewed labs and imaging studies  Scheduled Meds:  carvedilol   10 mg Oral Daily   furosemide   40 mg Oral Daily   gabapentin   200 mg Oral BID   losartan   50 mg Oral Daily   revefenacin   175 mcg Nebulization Daily    rivaroxaban   20 mg Oral Q supper   rosuvastatin   5 mg Oral QHS   senna  1 tablet Oral Daily   spironolactone   12.5 mg Oral Daily     LOS: 58 days    Time spent: 35 minutes    [name] Garibaldi, MD [facility]  05/01/2024, 11:06 AM

## 2024-05-01 NOTE — Progress Notes (Signed)
 Refused afternoon dose of xarelto ; educated patient on importance of medication; still refused. He will selectively refused his medications.

## 2024-05-02 DIAGNOSIS — I639 Cerebral infarction, unspecified: Secondary | ICD-10-CM | POA: Diagnosis not present

## 2024-05-02 NOTE — Progress Notes (Signed)
 Physical Therapy Treatment Patient Details Name: Merrik Puebla MRN: [id number] DOB: 10-Sep-1966 Today's Date: 05/02/2024   History of Present Illness  Patient is 58 y.o. male presented to ED on 03/04/24 with stroke like symptoms when caregiver found him with slurred speech and right sided weakness. Head CT reveals Acute left MCA territory infarct affecting the insula, caudate nucleus, lentiform nucleus and internal capsule. PMH significant of Schizoaffective disorder, heart failure with reduced ejection fraction, pretension, DMII, COPD, s/p bilateral amputations, chronic pain, and polysubstance abuse.     PT Comments  S: PT session initiated with Mathis sitting up in bed, agreed to letting this PT in his room  O: Session focused on discussing options for helping Briar be more independent with his mobility and basic activities of daily living; this PT demonstrated to him an idea of using a recliner that has the all four wheel breaking mechanism to move around his room without the need for assistance, and potentially be able to get into the bathroom without a staff member bringing the bedside commode to him; ultimately, Ante declined trying this idea, but he did voice that he knows he could be more independent in this hospital room with a wheelchair; discussion then moved to looking at PT goals, which is making good progress towards; he agreed to adding a floor transfer goal, and we discussed working on the components of this task as exercise exercises; during the session, Kassim received a call on the hospital room phone, and he was able to reach the phone. Turn it on, and have a discussion with the person on the other line without much need to repeat himself; we ended the session by making a plan for PT session tomorrow from 9 AM to 10 AM  A: Avid has met most of the previous PT goals established, and agrees to a goal update, including working on floor transfers; he has the physical wherewithal to be able  to transfer safely bed to wheelchair and wheelchair to commode, which would give him more independence, and hopefully foster better, well-being; in the acute care setting, it's very difficult to get a wheelchair dedicated to one room, and in the absence of his own wheelchair, Bettyjo Brunette is very limited;   P: Plan to return tomorrow at 9 AM to continue the work of improving Kyshon's's functional mobility, safety, and efficiency with transfers; will also start working on exercises aimed at the components of floor transfers, with particular attention to weight-bearing through,  and strengthening,  right upper extremity.        Time: 1310-1340 PT Time Calculation (min) (ACUTE ONLY): 30 min  Charges:    $Self Care/Home Management: 23-37 PT General Charges $$ ACUTE PT VISIT: 1 Visit                     Darcus Eastern, PT  Acute Rehabilitation Services Office [phone] Secure Chat welcomed    Marcial Setting 05/02/2024, 4:44 PM

## 2024-05-02 NOTE — Progress Notes (Signed)
 PROGRESS NOTE [name]  QMV:[id number] DOB: 1966/02/20 DOA: 03/04/2024 PCP: Abraham Abo, MD   Brief Narrative: [name] is an 58 y.o. male with a history of cardiomyopathy, cocaine abuse comes into the ED on 03/04/2024 for slurred speech, confusion and right-sided weakness, CT of the head showed left MCA infarct, CTA of the head and neck revealed an M2 occlusion on the left, echocardiogram revealed large mobile left ventricular thrombus on the left and a right ventricular apical thrombus on the right, EF was less than 20%.  Cardiology was consulted recommended a DOAC.  PT evaluated the patient recommended skilled nursing facility placement.  Patient was found to lack medical decision capacity such that he is not amenable to determine his discharge disposition or leave AGAINST MEDICAL ADVICE.    Over the last 1 week he has been noncompliant with his medication and refusing physical examination, he is also been refusing his oral meds.  TOC is pursuing guardianship with APS as he lacks medical decisional capacity.  Psychiatry presently recommending to await APS decision for guardianship before discharging home.  Patient does not qualify for forced medication administration.  Assessment & Plan:   Principal Problem:   Acute CVA (cerebrovascular accident) (HCC) Active Problems:   LV (left ventricular) mural thrombus   Chronic systolic CHF (congestive heart failure) (HCC)   Prolonged QT interval   Elevated liver enzymes   COPD (chronic obstructive pulmonary disease) (HCC)   Cocaine abuse (HCC)   Schizoaffective disorder, bipolar type (HCC)   Benign essential HTN   Acid reflux   History of DVT (deep vein thrombosis)   Type 2 diabetes mellitus (HCC)   Status post bilateral BKA.   Dilated cardiomyopathy (HCC)  Acute CVA (cerebrovascular accident) (HCC) -He underwent extensive workup and imaging revealed left MCA territory infarct, caudate nucleus, lentiform nucleus and internal capsule  MRI confirmed. -TEE was noted for large mobile left lower clot just under the mitral valve with an EF of 20% and a small right apical clot with reduced RV systolic function. -He was started on Eliquis  for intracardiac thrombus, however he remained noncompliant.  In order to improve compliance, medicine changed to Xarelto  for once daily dosing.  -Psychiatry has deemed the patient to lack capacity and resist compliant with care.   -There is no safe way to force medication on him but he refuses to take it as a result nursing staff and physician simply make multiple attempts throughout the day to convince the patient of the need to comply with his medical therapy. -Does not qualify for forced medication.   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 Last 2D echo showed an EF of less than 20%. -eliquis  to xarelto   to hopefully support better adherence with the daily dosing.  - also changed carvedilol  to 24hr tablet so that if he agrees to only take medications in the morning he will at least get recommended dosing. Has been refusing most of the medications.   Schizoaffective disorder bipolar disorder lacks medical capacity: Psych was consulted and recommended that the patient lacks medical capacity to make decisions if the patient attempts to leave AGAINST MEDICAL ADVICE he should be involuntarily committed. -TOC has contacted APS to pursue guardianship as patient lacks medical decision capacity. -Psych is presently recommending to await APS guardianship prior to attempting discharging him.  Essential hypertension: Blood pressure is stable today.  Current therapy: losartan , spironolactone .  and carvedilol  was changed from BID to daily  dosing.  Hyperlipidemia: Continue Crestor .   Diabetes mellitus type 2:  last A1c of 5.4. refuses monitoring    Polysubstance abuse: UDS was positive for cocaine and benzos.    Deconditioning: Ambulatory status complicated by previous right AKA and left BKA has no insurance coverage for SNF. - refusing therapy   History of COPD: Continue inhalers.   History of DVT: Continue xarelto   Estimated body mass index is 20.09 kg/m as calculated from the following:   Height as of this encounter: 5\' 9"  (1.753 m).   Weight as of this encounter: 61.7 kg.   DVT prophylaxis: xarelto   Code Status: Full code Family Communication: No family at bedside Disposition Plan:  Status is: Inpatient Remains inpatient appropriate because: Awaiting safe disposition plan  Consultants:  Psych Cardiology Neurology  Subjective: Seen and examined.  Sleepy but arousable.  No complaints.  Objective: Vitals:   05/01/24 1614 05/01/24 2122 05/02/24 0432 05/02/24 0906  BP: 128/81 (!) 145/98 (!) 147/104   Pulse: 74 67 66   Resp: 18     Temp:      TempSrc:      SpO2: 98% 100% 97% 98%  Weight:      Height:        Intake/Output Summary (Last 24 hours) at 05/02/2024 0910 Last data filed at 05/01/2024 1700 Gross per 24 hour  Intake 240 ml  Output 700 ml  Net -460 ml   Filed Weights   03/26/24 0431 03/28/24 0456 04/04/24 0500  Weight: 42.7 kg 47.5 kg 61.7 kg    Examination: General exam: Appears calm and comfortable  Respiratory system: Clear to auscultation. Respiratory effort normal. Cardiovascular system: S1 & S2 heard, RRR. No JVD, murmurs, rubs, gallops or clicks. No pedal edema. Gastrointestinal system: Abdomen is nondistended, soft and nontender. No organomegaly or masses felt. Normal bowel sounds heard. Central nervous system: Alert, not answering questions.  Wants to sleep. Extremities: Bilateral BKA   Data Reviewed: I have personally reviewed labs and imaging studies  Scheduled Meds:  carvedilol   10 mg Oral Daily   furosemide   40 mg Oral Daily   gabapentin   200 mg Oral BID   losartan   50 mg Oral Daily   revefenacin   175 mcg Nebulization Daily   rivaroxaban    20 mg Oral Q supper   rosuvastatin   5 mg Oral QHS   senna  1 tablet Oral Daily   spironolactone   12.5 mg Oral Daily     LOS: 59 days    Time spent: 35 minutes    Modena Andes, MD [facility]  05/02/2024, 9:10 AM

## 2024-05-03 DIAGNOSIS — I639 Cerebral infarction, unspecified: Secondary | ICD-10-CM | POA: Diagnosis not present

## 2024-05-03 MED ORDER — LOSARTAN POTASSIUM 50 MG PO TABS
100.0000 mg | ORAL_TABLET | Freq: Every day | ORAL | Status: DC
  Administered 2024-05-03 – 2024-06-03 (×26): 100 mg via ORAL
  Filled 2024-05-03 (×31): qty 2

## 2024-05-03 MED ORDER — SPIRONOLACTONE 25 MG PO TABS
25.0000 mg | ORAL_TABLET | Freq: Every day | ORAL | Status: DC
  Administered 2024-05-03 – 2024-08-17 (×55): 25 mg via ORAL
  Filled 2024-05-03 (×65): qty 1

## 2024-05-03 NOTE — TOC Progression Note (Signed)
 Transition of Care [facility]) - Progression Note    Patient Details  Name: [name] MRN: [id number] Date of Birth: October 24, 1966  Transition of Care [facility]) CM/SW Contact  [name] Forest, [name] Phone Number: 05/03/2024, 2:31 PM  Clinical Narrative:    1:45pm: CSW spoke with patient at bedside to discuss discharge plan per his request. CSW informed patient of all updated information. Patient states the caregiver from [facility] is [name] @ [phone]. [name] called into patient's room while CSW was present and patient requested CSW speak with [name].  CSW spoke with [name] who confirms she has filed a petition for guardianship of the patient. [name] states the court date is 05/26/24 at 2pm. [name] states she will come to the hospital to assist patient so that he can participate in the hearing. [name] states she will visit patient tomorrow. All questions answered and the call ended.  CSW resumed speaking with patient to answer all of his questions regarding the guardianship petition and where he will live afterwards. Patient states understanding being deemed to lack capacity for decision making. Patient states he understands he needs assistance with his activities of daily living and cannot live alone anymore. All of patient's questions were answered and patient agreeable to reach out if new questions arise.  CSW wrote court date information down for patient so he can refer back to it when needed.  Unit CSW will continue following for additional discharge planning needs.  10am: CSW spoke with [name], unit CSW to obtain permission to speak with patient. [name] and CSW discussed updates regarding patient's discharge plan. Per [name], APS has signed off and patient's caregiver from [facility] has petitioned for guardianship of patient.  9am: CSW received call from patient requesting CSW come speak with him regarding his discharge plan.   Expected Discharge Plan: Skilled Nursing Facility Barriers  to Discharge: English as a second language teacher, Active Substance Use - Placement, SNF Pending bed offer  Expected Discharge Plan and Services       Living arrangements for the past 2 months: Apartment Expected Discharge Date: 03/14/24                                     Social Determinants of Health (SDOH) Interventions SDOH Screenings   Food Insecurity: Food Insecurity Present (03/06/2024)  Housing: High Risk (03/06/2024)  Transportation Needs: No Transportation Needs (03/06/2024)  Utilities: Patient Declined (03/06/2024)  Alcohol Screen: Medium Risk (06/24/2019)  Depression (PHQ2-9): Low Risk  (02/18/2024)  Recent Concern: Depression (PHQ2-9) - Medium Risk (01/21/2024)  Financial Resource Strain: High Risk (07/24/2021)   Received from [facility]  Physical Activity: Inactive (02/18/2024)  Social Connections: Unknown (04/05/2022)   Received from [facility]  Stress: Stress Concern Present (07/24/2021)   Received from [facility], [facility]  Tobacco Use: High Risk (03/04/2024)  Health Literacy: Adequate Health Literacy (02/18/2024)    Readmission Risk Interventions    12/21/2023    9:25 AM 12/09/2022    9:02 AM 12/01/2022    3:56 PM  Readmission Risk Prevention Plan  Transportation Screening Complete Complete Complete  HRI or Home Care Consult Complete    Social Work Consult for Recovery Care Planning/Counseling Complete    Palliative Care Screening Not Applicable    Medication Review Oceanographer) Complete Complete Complete  PCP or Specialist appointment within 3-5 days of discharge  Complete Complete  HRI or Home Care  Consult  Complete Complete  SW Recovery Care/Counseling Consult  Complete Complete  Palliative Care Screening  Not Applicable Not Applicable  Skilled Nursing Facility  Not Applicable Not Applicable  SNF Comments   n/a

## 2024-05-03 NOTE — Progress Notes (Signed)
 Physical Therapy Treatment Patient Details Name: [name] MRN: [id number] DOB: [birth year] Today's Date: 05/03/2024   History of Present Illness Patient is 58 y.o. male presented to ED on 03/04/24 with stroke like symptoms when caregiver found him with slurred speech and right sided weakness. Head CT reveals Acute left MCA territory infarct affecting the insula, caudate nucleus, lentiform nucleus and internal capsule. PMH significant of  Schizoaffective disorder, heart failure with reduced ejection fraction, pretension, DMII, COPD, s/p bilateral amputations, chronic pain, and polysubstance abuse.    PT Comments  Continuing work on functional mobility; began today's session with a discussion about the goal of four transfers, and breaking down the task into smaller parts that we can use as therapeutic exercise exercises; [name] agreed to participate, and we opted to go to the five [employer];  On 5N, [name] began his transfer to the mat table via anterior posterior similar to the way he tends to get into bed from the wheelchair --  leaning forward, holding the footboard of the bed and lifting up onto his left knee and right knee in a tall kneel-type position, before turning and sitting down, usually on his right hip; Today, without the bed rails, it was much more difficult for [name] to come forward and pull up onto his knees; he opted to sit back down to his wheelchair; then we discussed other options to get to the mat, including lateral scoot; We removed the armrest from the wheelchair and positioned for a lateral scoot (towards his R side);  [name] tends to scoot his hip sideways to initiate the lateral, and then turn his body so that he is essentially doing the same interior posterior transfer just from the side of the wheelchair; we did some problem solving here and PT moved the set of stairs for [name] to hold onto on his right, then a shower chair with the back to [name]'s left for safety; the shower chair  also gave him more surface area and the back of the shower chair to hold as needed; one particular piece that made this transfer difficult is that he situated the wheelchair for a transfer towards his weaker and less coordinated right arm;   While he did not make the transfer to the mat table today, he still showed willingness to participate, ability to recognize that we needed to change the set up of the transfer, and [name] very much did meet the goal of working his right arm today    If plan is discharge home, recommend the following: Supervision due to cognitive status;Direct supervision/assist for financial management;Direct supervision/assist for medications management   Can travel by private vehicle        Equipment Recommendations  None recommended by PT    Recommendations for Other Services OT consult;Speech consult     Precautions / Restrictions Precautions Precautions: Fall Precaution/Restrictions Comments: h/o BLE amputations Restrictions Weight Bearing Restrictions Per Provider Order: No     Mobility  Bed Mobility Overal bed mobility: Modified Independent                  Transfers Overall transfer level: Needs assistance Equipment used: None (no equipment needed with AP transfer wc<>hospital bed, with heavy use of bedrails) Transfers: Bed to chair/wheelchair/BSC         Anterior-Posterior transfers: Supervision  Lateral/Scoot Transfers: Contact guard assist, Min assist General transfer comment: [name] favors the Ant-Post transfer, and heavily uses bedrails wtih wc<>bed transfer; more difficulty with this technique getting to  the mat table, and he engaged in problem-solving, adjusting the environment; Noting he tends to orient his body towrds the ant-post direction, even while working on lateral scooting    Ambulation/Gait                   Stairs             Wheelchair Mobility     Tilt Bed    Modified Rankin (Stroke Patients  Only) Modified Rankin (Stroke Patients Only) Pre-Morbid Rankin Score: Slight disability Modified Rankin: Moderately severe disability     Balance     Sitting balance-Leahy Scale: Good                                      Communication Communication Communication: Impaired Factors Affecting Communication: Difficulty expressing self;Reduced clarity of speech  Cognition Arousal: Alert Behavior During Therapy: WFL for tasks assessed/performed                           PT - Cognition Comments: Participating in problem-solving during transfer training today Following commands: Intact Following commands impaired:  (Responds better when commands are presented more like invitations)    Cueing Cueing Techniques: Verbal cues, Tactile cues, Other (comments) (demonstration)  Exercises      General Comments General comments (skin integrity, edema, etc.): [name] is asking if he can get outside with staff assist; got verbal order from Dr. Lilyan [name]; [name] hopes [name], with [place] [facility] [name], can help get his wheelchair to him      Pertinent Vitals/Pain Pain Assessment Pain Assessment: Faces Faces Pain Scale: Hurts a little bit Pain Location: Bil knees with antpost onto mat table; reports "they (knees) don't feel good", when asked if that type of transfer makes his knees hurt Pain Descriptors / Indicators:  (Pt statement) Pain Intervention(s): Monitored during session    Home Living                          Prior Function            PT Goals (current goals can now be found in the care plan section) Acute Rehab PT Goals Patient Stated Goal: would like to be able to get home PT Goal Formulation: With patient Time For Goal Achievement: 05/16/24 Potential to Achieve Goals: Fair Progress towards PT goals: Progressing toward goals    Frequency    Min 2X/week      PT Plan      Co-evaluation              AM-PAC PT "6 Clicks" Mobility    Outcome Measure  Help needed turning from your back to your side while in a flat bed without using bedrails?: None Help needed moving from lying on your back to sitting on the side of a flat bed without using bedrails?: None Help needed moving to and from a bed to a chair (including a wheelchair)?: A Little Help needed standing up from a chair using your arms (e.g., wheelchair or bedside chair)?: Total Help needed to walk in hospital room?: Total Help needed climbing 3-5 steps with a railing? : Total 6 Click Score: 14    End of Session   Activity Tolerance: Patient tolerated treatment well Patient left: Other (comment) (Rolling with Mobility Specialist, who will help him back to his room)  Nurse Communication: Mobility status PT Visit Diagnosis: Other abnormalities of gait and mobility (R26.89);Muscle weakness (generalized) (M62.81);Other symptoms and signs involving the nervous system (R29.898);Hemiplegia and hemiparesis Hemiplegia - Right/Left: Right Hemiplegia - dominant/non-dominant: Dominant Hemiplegia - caused by: Cerebral infarction     Time: 0910-0955 PT Time Calculation (min) (ACUTE ONLY): 45 min  Charges:    $Therapeutic Activity: 38-52 mins PT General Charges $$ ACUTE PT VISIT: 1 Visit                     Darcus Eastern, PT  Acute Rehabilitation Services Office [phone] Secure Chat welcomed    Marcial Setting 05/03/2024, 6:36 PM

## 2024-05-03 NOTE — Progress Notes (Signed)
 Pt refused Xeralto.  MD notified by secure chat.

## 2024-05-03 NOTE — Progress Notes (Signed)
 PROGRESS NOTE [name]  ZOX:[id number] DOB: 1966/07/08 DOA: 03/04/2024 PCP: Abraham Abo, MD   Brief Narrative: [name] is an 58 y.o. male with a history of cardiomyopathy, cocaine abuse comes into the ED on 03/04/2024 for slurred speech, confusion and right-sided weakness, CT of the head showed left MCA infarct, CTA of the head and neck revealed an M2 occlusion on the left, echocardiogram revealed large mobile left ventricular thrombus on the left and a right ventricular apical thrombus on the right, EF was less than 20%.  Cardiology was consulted recommended a DOAC.  PT evaluated the patient recommended skilled nursing facility placement.  Patient was found to lack medical decision capacity such that he is not amenable to determine his discharge disposition or leave AGAINST MEDICAL ADVICE.    Over the last 1 week he has been noncompliant with his medication and refusing physical examination, he is also been refusing his oral meds.  TOC is pursuing guardianship with APS as he lacks medical decisional capacity.  Psychiatry presently recommending to await APS decision for guardianship before discharging home.  Patient does not qualify for forced medication administration.  Assessment & Plan:   Principal Problem:   Acute CVA (cerebrovascular accident) (HCC) Active Problems:   LV (left ventricular) mural thrombus   Chronic systolic CHF (congestive heart failure) (HCC)   Prolonged QT interval   Elevated liver enzymes   COPD (chronic obstructive pulmonary disease) (HCC)   Cocaine abuse (HCC)   Schizoaffective disorder, bipolar type (HCC)   Benign essential HTN   Acid reflux   History of DVT (deep vein thrombosis)   Type 2 diabetes mellitus (HCC)   Status post bilateral BKA.   Dilated cardiomyopathy (HCC)  Acute CVA (cerebrovascular accident) (HCC) -He underwent extensive workup and imaging revealed left MCA territory infarct, caudate nucleus, lentiform nucleus and internal capsule  MRI confirmed. -TEE was noted for large mobile left lower clot just under the mitral valve with an EF of 20% and a small right apical clot with reduced RV systolic function. -He was started on Eliquis  for intracardiac thrombus, however he remained noncompliant.  In order to improve compliance, medicine changed to Xarelto  for once daily dosing.  -Psychiatry has deemed the patient to lack capacity and resist compliant with care.   -There is no safe way to force medication on him but he refuses to take it as a result nursing staff and physician simply make multiple attempts throughout the day to convince the patient of the need to comply with his medical therapy. -Does not qualify for forced medication.   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 Last 2D echo showed an EF of less than 20%. -eliquis  to xarelto   to hopefully support better adherence with the daily dosing.  - also changed carvedilol  to 24hr tablet so that if he agrees to only take medications in the morning he will at least get recommended dosing. Has been refusing most of the medications.   Schizoaffective disorder bipolar disorder lacks medical capacity: Psych was consulted and recommended that the patient lacks medical capacity to make decisions if the patient attempts to leave AGAINST MEDICAL ADVICE he should be involuntarily committed. -TOC has contacted APS to pursue guardianship as patient lacks medical decision capacity. -Psych is presently recommending to await APS guardianship prior to attempting discharging him.  Essential hypertension: Blood pressure slightly elevated.  Increase losartan  to 100 mg, Aldactone  to 25 mg, continue current Coreg .  Monitor  for next 48 hours.  If remains elevated, will adjust further.  Hyperlipidemia: Continue Crestor .   Diabetes mellitus type 2:  last A1c of 5.4. refuses monitoring    Polysubstance  abuse: UDS was positive for cocaine and benzos.   Deconditioning: Ambulatory status complicated by previous right AKA and left BKA has no insurance coverage for SNF. - refusing therapy   History of COPD: Continue inhalers.   History of DVT: Continue xarelto   Estimated body mass index is 20.09 kg/m as calculated from the following:   Height as of this encounter: 5\' 9"  (1.753 m).   Weight as of this encounter: 61.7 kg.   DVT prophylaxis: xarelto   Code Status: Full code Family Communication: No family at bedside Disposition Plan:  Status is: Inpatient Remains inpatient appropriate because: Awaiting safe disposition plan  Consultants:  Psych Cardiology Neurology  Subjective: Patient seen and examined.  He has no complaints.  Objective: Vitals:   05/02/24 0432 05/02/24 0906 05/02/24 1559 05/02/24 2010  BP: (!) 147/104  (!) 140/107 (!) 149/100  Pulse: 66  69 68  Resp:    18  Temp:    98 F (36.7 C)  TempSrc:      SpO2: 97% 98% 100% 98%  Weight:      Height:        Intake/Output Summary (Last 24 hours) at 05/03/2024 0807 Last data filed at 05/03/2024 0155 Gross per 24 hour  Intake --  Output 1475 ml  Net -1475 ml   Filed Weights   03/26/24 0431 03/28/24 0456 04/04/24 0500  Weight: 42.7 kg 47.5 kg 61.7 kg    Examination: General exam: Appears calm and comfortable  Respiratory system: Clear to auscultation. Respiratory effort normal. Cardiovascular system: S1 & S2 heard, RRR. No JVD, murmurs, rubs, gallops or clicks. No pedal edema. Gastrointestinal system: Abdomen is nondistended, soft and nontender. No organomegaly or masses felt. Normal bowel sounds heard. Central nervous system: Alert, not answering questions.  Wants to sleep. Extremities: Bilateral BKA   Data Reviewed: I have personally reviewed labs and imaging studies  Scheduled Meds:  carvedilol   10 mg Oral Daily   furosemide   40 mg Oral Daily   gabapentin   200 mg Oral BID   losartan   100 mg Oral  Daily   revefenacin   175 mcg Nebulization Daily   rivaroxaban   20 mg Oral Q supper   rosuvastatin   5 mg Oral QHS   senna  1 tablet Oral Daily   spironolactone   25 mg Oral Daily     LOS: 60 days    Time spent: 35 minutes    Modena Andes, MD [facility]  05/03/2024, 8:07 AM

## 2024-05-03 NOTE — Progress Notes (Signed)
 Mobility Specialist Progress Note:    05/03/24 1000  Mobility  Activity Ambulated with assistance in hallway;Transferred from chair to bed (Propelled in w/c)  Level of Assistance Standby assist, set-up cues, supervision of patient - no hands on  Assistive Device Wheelchair  Distance Ambulated (ft) 100 ft  Activity Response Tolerated well  Mobility Referral Yes  Mobility visit 1 Mobility  Mobility Specialist Start Time (ACUTE ONLY) 1000  Mobility Specialist Stop Time (ACUTE ONLY) 1025  Mobility Specialist Time Calculation (min) (ACUTE ONLY) 25 min   Pt received in w/c and agreeable. Propelled in hallway w/ no complaints. Wheeled pt outside for short period of time. Returned to room w/o fault. Completed anterior scoot to bed w/ no complaints. Pt left in bed with call bell and bed alarm on.  [name] Mobility Specialist Please contact via Special educational needs teacher or Rehab office at [phone]

## 2024-05-04 DIAGNOSIS — I639 Cerebral infarction, unspecified: Secondary | ICD-10-CM | POA: Diagnosis not present

## 2024-05-04 NOTE — Progress Notes (Signed)
 Chaplain followed up with pt. Pt in good spirits and looking forward to 6/26 [address] date. Chaplain will continue to follow.

## 2024-05-04 NOTE — Progress Notes (Signed)
 Occupational Therapy Treatment Patient Details Name: [name] MRN: [id number] DOB: Sep 30, 1966 Today's Date: 05/04/2024   History of present illness Patient is 58 y.o. male presented to ED on 03/04/24 with stroke like symptoms when caregiver found him with slurred speech and right sided weakness. Head CT reveals Acute left MCA territory infarct affecting the insula, caudate nucleus, lentiform nucleus and internal capsule. PMH significant of  Schizoaffective disorder, heart failure with reduced ejection fraction, pretension, DMII, COPD, s/p bilateral amputations, chronic pain, and polysubstance abuse.   OT comments  Session focus on RUE strengthening and stretching, cognitive testing, and functional mobility in the wheelchair. Patient receptive to education with regard to RUE strengthening, with improved grip, and able to achieve full AROM on elbow, wrist, and fingers. OT gently asking about medications, which patient stated, "I dont want to take them", when asked further, patient stating, "Its not of thier damn buisness". Patient then becoming agitated with RN present as she kept trying to engage patient in medication reasoning, with patient kicking her out of the room. Patient able to achieve calm state with gentle redirection from OT, and stating that is well within his right to refuse his medications. Patient then working on strengthening with RUE to propel wheelchair, and socializing in hallway. Patient requiring minimal cues for pacing as patient has a harder time communicating when he gets animated, but able to express all thoughts. OT recommendation remains appropriate; will continue to follow.      If plan is discharge home, recommend the following:  A little help with walking and/or transfers;A little help with bathing/dressing/bathroom;Supervision due to cognitive status;Direct supervision/assist for financial management;Direct supervision/assist for medications management   Equipment  Recommendations  None recommended by OT    Recommendations for Other Services      Precautions / Restrictions Precautions Precautions: Fall Recall of Precautions/Restrictions: Impaired Precaution/Restrictions Comments: h/o BLE amputations Restrictions Weight Bearing Restrictions Per Provider Order: No       Mobility Bed Mobility Overal bed mobility: Modified Independent Bed Mobility: Supine to Sit, Sit to Supine                Transfers Overall transfer level: Needs assistance Equipment used: None (no equipment needed with AP transfer wc<>hospital bed, with heavy use of bedrails) Transfers: Bed to chair/wheelchair/BSC         Anterior-Posterior transfers: Supervision   General transfer comment: Supervision for safety     Balance Overall balance assessment: Needs assistance Sitting-balance support: Bilateral upper extremity supported Sitting balance-Leahy Scale: Good Sitting balance - Comments: sitting upright in bed without the need for UE support                                   ADL either performed or assessed with clinical judgement   ADL Overall ADL's : Needs assistance/impaired                         Toilet Transfer: Supervision/safety;Anterior/posterior Toilet Transfer Details (indicate cue type and reason): simulated via lateral scoot to drop arm wheelchair         Functional mobility during ADLs: Supervision/safety;Wheelchair General ADL Comments: Session focus on RUE strengthening and stretching, cognitive testing, and functional mobility in the wheelchair. Patient receptive to education with regard to RUE strengthening, with improved grip, and able to achieve full AROM on elbow, wrist, and fingers. OT gently asking about medications, which patient  stated, "I dont want to take them", when asked further, patient stating, "Its not of thier damn buisness". Patient then becoming agitated with RN present as she kept trying to  engage patient in medication, with patient kicking her out of the room. Patient able to achieve calm state with gentle redirection from OT, and stating that is well within his right to refuse his medications. Patient then working on strengthening with RUE to propel wheelchair, and socializing in hallway. Patient requiring minimal cues for pacing as patient has a harder time communicating when he gets animated, but able to express all thoughts. OT recommendation remains appropriate; will continue to follow.    Extremity/Trunk Assessment Upper Extremity Assessment RUE Deficits / Details: decreased proprioception, neuromuscular endurance, and strength noted when attempting to propel wheelchair, however able to achieve full end range and improved grip in RUE RUE Sensation: decreased light touch RUE Coordination: decreased fine motor;decreased gross motor            Vision       Perception     Praxis     Communication Communication Communication: Impaired Factors Affecting Communication: Difficulty expressing self;Reduced clarity of speech   Cognition Arousal: Alert Behavior During Therapy: WFL for tasks assessed/performed Cognition: Cognition impaired Difficult to assess due to: Impaired communication Orientation impairments: Time Awareness: Online awareness impaired, Intellectual awareness intact Memory impairment (select all impairments): Short-term memory, Working memory Attention impairment (select first level of impairment): Selective attention Executive functioning impairment (select all impairments): Reasoning, Problem solving, Organization, Sequencing OT - Cognition Comments: Improved awareness of R side, becomes frustrated and less communicative when he wants to be able to talk and is unable to convey what he means, does well with gentle redirecting and giving him cues to breathe or slow down                 Following commands: Intact Following commands impaired:  Follows one step commands inconsistently, Follows one step commands with increased time (Responds better when commands are presented more like invitations)      Cueing   Cueing Techniques: Verbal cues, Tactile cues, Other (comments)  Exercises      Shoulder Instructions       General Comments      Pertinent Vitals/ Pain       Pain Assessment Pain Assessment: Faces Pain Score: 0-No pain  Home Living                                          Prior Functioning/Environment              Frequency  Min 1X/week        Progress Toward Goals  OT Goals(current goals can now be found in the care plan section)  Progress towards OT goals: Progressing toward goals  Acute Rehab OT Goals Patient Stated Goal: to keep working with you OT Goal Formulation: With patient Time For Goal Achievement: 05/10/24 Potential to Achieve Goals: Fair  Plan      Co-evaluation                 AM-PAC OT "6 Clicks" Daily Activity     Outcome Measure   Help from another person eating meals?: None Help from another person taking care of personal grooming?: A Little Help from another person toileting, which includes using toliet, bedpan, or urinal?: A Lot Help from another  person bathing (including washing, rinsing, drying)?: A Little Help from another person to put on and taking off regular upper body clothing?: A Little Help from another person to put on and taking off regular lower body clothing?: A Little 6 Click Score: 18    End of Session    OT Visit Diagnosis: Unsteadiness on feet (R26.81);Other abnormalities of gait and mobility (R26.89);Muscle weakness (generalized) (M62.81);Other symptoms and signs involving the nervous system (R29.898);Other symptoms and signs involving cognitive function;Hemiplegia and hemiparesis Hemiplegia - Right/Left: Right Hemiplegia - dominant/non-dominant: Dominant Hemiplegia - caused by: Cerebral infarction   Activity Tolerance  Patient tolerated treatment well   Patient Left in bed;with call bell/phone within reach;with bed alarm set   Nurse Communication Mobility status        Time: 1308-6578 OT Time Calculation (min): 56 min  Charges: OT General Charges $OT Visit: 1 Visit OT Treatments $Self Care/Home Management : 53-67 mins  Mollie Anger E. Nickoles Gregori, OTR/L Acute Rehabilitation Services [phone]   Vincent Greek 05/04/2024, 12:01 PM

## 2024-05-04 NOTE — TOC Progression Note (Signed)
 Transition of Care [facility]) - Progression Note    Patient Details  Name: [name] MRN: [id number] Date of Birth: 16-Mar-1966  [facility] CM/SW Contact  Tandy Fam, [name] Phone Number: 05/04/2024, 11:06 AM  Clinical Narrative:   CSW received call from [place] with [name] to discuss update on patient's status and disposition. CSW provided brief update on guardianship process as patient is still deemed to not have capacity, and [name] is in agreement with this. CSW answered questions. CSW to continue to follow.    Expected Discharge Plan: Skilled Nursing Facility Barriers to Discharge: English as a second language teacher, Active Substance Use - Placement, SNF Pending bed offer  Expected Discharge Plan and Services       Living arrangements for the past 2 months: Apartment Expected Discharge Date: 03/14/24                                     Social Determinants of Health (SDOH) Interventions SDOH Screenings   Food Insecurity: Food Insecurity Present (03/06/2024)  Housing: High Risk (03/06/2024)  Transportation Needs: No Transportation Needs (03/06/2024)  Utilities: Patient Declined (03/06/2024)  Alcohol Screen: Medium Risk (06/24/2019)  Depression (PHQ2-9): Low Risk  (02/18/2024)  Recent Concern: Depression (PHQ2-9) - Medium Risk (01/21/2024)  Financial Resource Strain: High Risk (07/24/2021)   Received from [facility], [facility]  Physical Activity: Inactive (02/18/2024)  Social Connections: Unknown (04/05/2022)   Received from [facility], [facility]  Stress: Stress Concern Present (07/24/2021)   Received from [facility], [facility]  Tobacco Use: High Risk (03/04/2024)  Health Literacy: Adequate Health Literacy (02/18/2024)    Readmission Risk Interventions    12/21/2023    9:25 AM 12/09/2022    9:02 AM 12/01/2022    3:56 PM  Readmission Risk Prevention Plan  Transportation Screening Complete Complete Complete  HRI or Home Care Consult Complete     Social Work Consult for Recovery Care Planning/Counseling Complete    Palliative Care Screening Not Applicable    Medication Review Oceanographer) Complete Complete Complete  PCP or Specialist appointment within 3-5 days of discharge  Complete Complete  HRI or Home Care Consult  Complete Complete  SW Recovery Care/Counseling Consult  Complete Complete  Palliative Care Screening  Not Applicable Not Applicable  Skilled Nursing Facility  Not Applicable Not Applicable  SNF Comments   n/a

## 2024-05-04 NOTE — Progress Notes (Signed)
 PROGRESS NOTE [name]  ZOX:[id number] DOB: [birth year] DOA: 03/04/2024 PCP: Abraham Abo, MD   Brief Narrative: Erin Uecker is an 58 y.o. male with a history of cardiomyopathy, cocaine abuse comes into the ED on 03/04/2024 for slurred speech, confusion and right-sided weakness, CT of the head showed left MCA infarct, CTA of the head and neck revealed an M2 occlusion on the left, echocardiogram revealed large mobile left ventricular thrombus on the left and a right ventricular apical thrombus on the right, EF was less than 20%.  Cardiology was consulted recommended a DOAC.  PT evaluated the patient recommended skilled nursing facility placement.  Patient was found to lack medical decision capacity such that he is not amenable to determine his discharge disposition or leave AGAINST MEDICAL ADVICE.    Over the last 1 week he has been noncompliant with his medication and refusing physical examination, he is also been refusing his oral meds.  TOC is pursuing guardianship with APS as he lacks medical decisional capacity.  Psychiatry presently recommending to await APS decision for guardianship before discharging home.  Patient does not qualify for forced medication administration.  Assessment & Plan:   Principal Problem:   Acute CVA (cerebrovascular accident) (HCC) Active Problems:   LV (left ventricular) mural thrombus   Chronic systolic CHF (congestive heart failure) (HCC)   Prolonged QT interval   Elevated liver enzymes   COPD (chronic obstructive pulmonary disease) (HCC)   Cocaine abuse (HCC)   Schizoaffective disorder, bipolar type (HCC)   Benign essential HTN   Acid reflux   History of DVT (deep vein thrombosis)   Type 2 diabetes mellitus (HCC)   Status post bilateral BKA.   Dilated cardiomyopathy (HCC)  Acute CVA (cerebrovascular accident) (HCC) -He underwent extensive workup and imaging revealed left MCA territory infarct, caudate nucleus, lentiform nucleus and internal capsule  MRI confirmed. -TEE was noted for large mobile left lower clot just under the mitral valve with an EF of 20% and a small right apical clot with reduced RV systolic function. -He was started on Eliquis  for intracardiac thrombus, however he remained noncompliant.  In order to improve compliance, medicine changed to Xarelto  for once daily dosing.  -Psychiatry has deemed the patient to lack capacity and resist compliant with care.   -There is no safe way to force medication on him but he refuses to take it as a result nursing staff and physician simply make multiple attempts throughout the day to convince the patient of the need to comply with his medical therapy. -Does not qualify for forced medication.   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 Last 2D echo showed an EF of less than 20%. -eliquis  to xarelto   to hopefully support better adherence with the daily dosing.  - also changed carvedilol  to 24hr tablet so that if he agrees to only take medications in the morning he will at least get recommended dosing. Has been refusing most of the medications.   Schizoaffective disorder bipolar disorder lacks medical capacity: Psych was consulted and recommended that the patient lacks medical capacity to make decisions if the patient attempts to leave AGAINST MEDICAL ADVICE he should be involuntarily committed. -TOC has contacted APS to pursue guardianship as patient lacks medical decision capacity. -Psych is presently recommending to await APS guardianship prior to attempting discharging him.  Essential hypertension: Blood pressure was still elevated so on 05/03/2024 increased losartan  to 100 mg, Aldactone  to 25 mg, continue current  Coreg .  Improving but labile.  Monitor further 24 hours.  Hyperlipidemia: Continue Crestor .   Diabetes mellitus type 2:  last A1c of 5.4. refuses monitoring    Polysubstance  abuse: UDS was positive for cocaine and benzos.   Deconditioning: Ambulatory status complicated by previous right AKA and left BKA has no insurance coverage for SNF. - refusing therapy at times   History of COPD: Continue inhalers.   History of DVT: Continue xarelto  however he is refusing medications including Xarelto  at times.  He has been counseled many times about the importance of compliance and risks of not taking medication.  Estimated body mass index is 20.09 kg/m as calculated from the following:   Height as of this encounter: 5\' 9"  (1.753 m).   Weight as of this encounter: 61.7 kg.   DVT prophylaxis: xarelto   Code Status: Full code Family Communication: No family at bedside Disposition Plan:  Status is: Inpatient Remains inpatient appropriate because: Awaiting safe disposition plan  Consultants:  Psych Cardiology Neurology  Subjective: Patient seen and examined.  Fully alert and oriented.  No complaints.  Objective: Vitals:   05/03/24 0825 05/03/24 1118 05/03/24 1627 05/03/24 2043  BP:  (!) 140/103 132/84 (!) 151/96  Pulse:  84 75 76  Resp:  18  18  Temp:      TempSrc:      SpO2: 98% 96% 97% 98%  Weight:      Height:        Intake/Output Summary (Last 24 hours) at 05/04/2024 0736 Last data filed at 05/04/2024 0600 Gross per 24 hour  Intake 1220 ml  Output 1500 ml  Net -280 ml   Filed Weights   03/26/24 0431 03/28/24 0456 04/04/24 0500  Weight: 42.7 kg 47.5 kg 61.7 kg    Examination: General exam: Appears calm and comfortable  Respiratory system: Clear to auscultation. Respiratory effort normal. Cardiovascular system: S1 & S2 heard, RRR. No JVD, murmurs, rubs, gallops or clicks. No pedal edema. Gastrointestinal system: Abdomen is nondistended, soft and nontender. No organomegaly or masses felt. Normal bowel sounds heard. Central nervous system: Alert, not answering questions.  Wants to sleep. Extremities: Bilateral BKA   Data Reviewed: I have  personally reviewed labs and imaging studies  Scheduled Meds:  carvedilol   10 mg Oral Daily   furosemide   40 mg Oral Daily   gabapentin   200 mg Oral BID   losartan   100 mg Oral Daily   revefenacin   175 mcg Nebulization Daily   rivaroxaban   20 mg Oral Q supper   rosuvastatin   5 mg Oral QHS   senna  1 tablet Oral Daily   spironolactone   25 mg Oral Daily     LOS: 61 days    Time spent: 35 minutes    Modena Andes, MD [facility]  05/04/2024, 7:36 AM

## 2024-05-04 NOTE — Progress Notes (Addendum)
 Attempted to check on patient to perform assessment and administer morning medications. Patient working with therapy and became upset that discussions have been had with providers about him refusing some medications off and on.  Attempted to calmly discuss with patient, as previously this RN has brought all medications to room and allowed patient to look at each medicine (prior to removing from packaging/ scanning) and let patient instruct RN on which medications he was agreeable to take.  Also provided re-education each time as needed and answered any questions he had.   Before discussion could be had, patient became verbally aggressive and stated " Why are you lying about me" and when again attempted to calmly discuss, patient told RN to "Get out of my room" and "matter of fact don't come back at all today"  Patient refusing assessments and all medications, MD notified.

## 2024-05-04 NOTE — Progress Notes (Signed)
 Patient ID: [name], male   DOB: 1966-01-08, 58 y.o.   MRN: [id number] Patient refused xarelto . Educated on need for xarelto  due to thrombus in heart. Patient states"I know what it is for, I don't care. This is the last time I'm going to tell you I'm not taking it. Get out of my room/" MD notified.  Genella [name], RN

## 2024-05-05 DIAGNOSIS — I639 Cerebral infarction, unspecified: Secondary | ICD-10-CM | POA: Diagnosis not present

## 2024-05-05 MED ORDER — CARBAMIDE PEROXIDE 6.5 % OT SOLN
5.0000 [drp] | Freq: Two times a day (BID) | OTIC | Status: DC
  Administered 2024-05-05 – 2024-05-06 (×3): 5 [drp] via OTIC
  Filled 2024-05-05: qty 15

## 2024-05-05 NOTE — Progress Notes (Signed)
 Pt refused 5 p.m. dose of Xeralto.  He will not discussk the reason for needing to take the med.

## 2024-05-05 NOTE — Progress Notes (Signed)
 Speech Language Pathology Treatment: Cognitive-Linguistic  Patient Details Name: [name] MRN: [id number] DOB: [birth year] Today's Date: 05/05/2024 Time: 4098-1191 SLP Time Calculation (min) (ACUTE ONLY): 22 min  Assessment / Plan / Recommendation Clinical Impression  Pt was engaged in speech therapy today, and motivated to work. He had anticipated SLP arrival today based on calendar on his wall, which he still seems to be using. He also still had his written list of speech strategies, which he pulled out and we reviewed. SLP then provided opportunities for him to practice at the sentence level. Pt read sentences out loud with Min cues given from SLP to assist with accuracy. Pt initially was a little defensive, but was educated about reason for cueing to help him improve his speech, and he was then more agreeable.  Pt also now has a new phone, and wanted to use it to pull up music. SLP gave Min cues for him to problem solve using it to search for a specific artist and then song. From a language standpoint, pt typed and read the song titles without assistance. SLP will continue to follow pt, sticking to current schedule as much as possible as it seems to be facilitating participation.    HPI HPI: Patient is 58 y.o. male presented to ED on 03/04/24 with stroke like symptoms when caregiver found him with slurred speech and right sided weakness. Head CT reveals Acute left MCA territory infarct affecting the insula, caudate nucleus, lentiform nucleus and internal capsule. Pt initially evaluated by SLP 4/5 but s/o 4/22 due to repeated refusals. SLP reordered 5/13 for repeat cognitive-linguistic eval.   PMH significant of  Schizoaffective disorder, heart failure with reduced ejection fraction, pretension, DMII, COPD, s/p bilateral amputations, chronic pain, and polysubstance abuse.      SLP Plan  Continue with current plan of care          Recommendations                          Intermittent Supervision/Assistance Dysarthria and anarthria (R47.1);Cognitive communication deficit (R41.841)     Continue with current plan of care     Beth Brooke., M.A. CCC-SLP Acute Rehabilitation Services Office: [phone]  Secure chat preferred   05/05/2024, 3:15 PM

## 2024-05-05 NOTE — Progress Notes (Signed)
 PROGRESS NOTE [name]  ZOX:[id number] DOB: [birth year] DOA: 03/04/2024 PCP: Abraham Abo, MD   Brief Narrative: [name] is an 58 y.o. male with a history of cardiomyopathy, cocaine abuse comes into the ED on 03/04/2024 for slurred speech, confusion and right-sided weakness, CT of the head showed left MCA infarct, CTA of the head and neck revealed an M2 occlusion on the left, echocardiogram revealed large mobile left ventricular thrombus on the left and a right ventricular apical thrombus on the right, EF was less than 20%.  Cardiology was consulted recommended a DOAC.  PT evaluated the patient recommended skilled nursing facility placement.  Patient was found to lack medical decision capacity such that he is not amenable to determine his discharge disposition or leave AGAINST MEDICAL ADVICE.    Over the last 1 week he has been noncompliant with his medication and refusing physical examination, he is also been refusing his oral meds.  TOC is pursuing guardianship with APS as he lacks medical decisional capacity.  Psychiatry presently recommending to await APS decision for guardianship before discharging home.  Patient does not qualify for forced medication administration.  Assessment & Plan:   Principal Problem:   Acute CVA (cerebrovascular accident) (HCC) Active Problems:   LV (left ventricular) mural thrombus   Chronic systolic CHF (congestive heart failure) (HCC)   Prolonged QT interval   Elevated liver enzymes   COPD (chronic obstructive pulmonary disease) (HCC)   Cocaine abuse (HCC)   Schizoaffective disorder, bipolar type (HCC)   Benign essential HTN   Acid reflux   History of DVT (deep vein thrombosis)   Type 2 diabetes mellitus (HCC)   Status post bilateral BKA.   Dilated cardiomyopathy (HCC)  Acute CVA (cerebrovascular accident) (HCC) -He underwent extensive workup and imaging revealed left MCA territory infarct, caudate nucleus, lentiform nucleus and internal capsule  MRI confirmed. -TEE was noted for large mobile left lower clot just under the mitral valve with an EF of 20% and a small right apical clot with reduced RV systolic function. -He was started on Eliquis  for intracardiac thrombus, however he remained noncompliant.  In order to improve compliance, medicine changed to Xarelto  for once daily dosing.  -Psychiatry has deemed the patient to lack capacity and resist compliant with care.   -There is no safe way to force medication on him but he refuses to take it as a result nursing staff and physician simply make multiple attempts throughout the day to convince the patient of the need to comply with his medical therapy. -Does not qualify for forced medication.   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 Last 2D echo showed an EF of less than 20%. -eliquis  to xarelto   to hopefully support better adherence with the daily dosing.  - also changed carvedilol  to 24hr tablet so that if he agrees to only take medications in the morning he will at least get recommended dosing. Has been refusing most of the medications.   Schizoaffective disorder bipolar disorder lacks medical capacity: Psych was consulted and recommended that the patient lacks medical capacity to make decisions if the patient attempts to leave AGAINST MEDICAL ADVICE he should be involuntarily committed. -TOC has contacted APS to pursue guardianship as patient lacks medical decision capacity. -Psych is presently recommending to await APS guardianship prior to attempting discharging him.  Essential hypertension: Blood pressure was still elevated so on 05/03/2024 increased losartan  to 100 mg, Aldactone  to 25 mg, continue current  Coreg .  Improving but labile.  Patient is refusing his Aldactone  and Lasix  at times  He has been.  Small hospitalist in and examined  Hyperlipidemia: Continue Crestor .   Diabetes  mellitus type 2:  last A1c of 5.4. refuses monitoring    Polysubstance abuse: UDS was positive for cocaine and benzos.   Deconditioning: Ambulatory status complicated by previous right AKA and left BKA has no insurance coverage for SNF. - refusing therapy at times   History of COPD: Continue inhalers.   History of DVT: Continue xarelto  however he is refusing medications including Xarelto  at times.  He has been counseled many times about the importance of compliance and risks of not taking medication.  Estimated body mass index is 20.09 kg/m as calculated from the following:   Height as of this encounter: 5\' 9"  (1.753 m).   Weight as of this encounter: 61.7 kg.   DVT prophylaxis: xarelto   Code Status: Full code Family Communication: No family at bedside Disposition Plan:  Status is: Inpatient Remains inpatient appropriate because: Awaiting safe disposition plan  Consultants:  Psych Cardiology Neurology  Subjective: Seen and examined.  No complaints.  Extensive counseling has been done multiple times so to be compliant with the medications but he continues to refuse) medications including Xarelto .  He understands the risks.  Objective: Vitals:   05/04/24 1146 05/04/24 1955 05/04/24 2217 05/05/24 0852  BP: 132/85 (!) 141/119 (!) 143/107   Pulse: 79 72 71 70  Resp: 18 18 15 18   Temp:  97.8 F (36.6 C) 98.7 F (37.1 C)   TempSrc:   Oral   SpO2: 100% 98% 99% 96%  Weight:      Height:        Intake/Output Summary (Last 24 hours) at 05/05/2024 1131 Last data filed at 05/04/2024 1300 Gross per 24 hour  Intake 320 ml  Output --  Net 320 ml   Filed Weights   03/26/24 0431 03/28/24 0456 04/04/24 0500  Weight: 42.7 kg 47.5 kg 61.7 kg    Examination: General exam: Appears calm and comfortable  Respiratory system: Clear to auscultation. Respiratory effort normal. Cardiovascular system: S1 & S2 heard, RRR. No JVD, murmurs, rubs, gallops or clicks. No pedal  edema. Gastrointestinal system: Abdomen is nondistended, soft and nontender. No organomegaly or masses felt. Normal bowel sounds heard. Central nervous system: Alert, not answering questions.  Wants to sleep. Extremities: Bilateral BKA   Data Reviewed: I have personally reviewed labs and imaging studies  Scheduled Meds:  carbamide peroxide  5 drop Both EARS BID   carvedilol   10 mg Oral Daily   furosemide   40 mg Oral Daily   gabapentin   200 mg Oral BID   losartan   100 mg Oral Daily   revefenacin   175 mcg Nebulization Daily   rivaroxaban   20 mg Oral Q supper   rosuvastatin   5 mg Oral QHS   senna  1 tablet Oral Daily   spironolactone   25 mg Oral Daily     LOS: 62 days    Time spent: 35 minutes    Modena Andes, MD [facility]  05/05/2024, 11:31 AM

## 2024-05-05 NOTE — Progress Notes (Signed)
 Pt refused 1000 lasix , senokot and spironolactone .  Attempted to explain reason for each med.  Pt stated "The doctors keep trying to give me all of this medicine I don't need. I'm not taking it!"  MD notified by secure chat.

## 2024-05-06 DIAGNOSIS — I639 Cerebral infarction, unspecified: Secondary | ICD-10-CM | POA: Diagnosis not present

## 2024-05-06 NOTE — Plan of Care (Signed)
  Problem: Acute Rehab PT Goals(only PT should resolve) Goal: Pt/caregiver will Perform Home Exercise Program Outcome: Adequate for Discharge Goal: PT Additional Goal #1 Outcome: Adequate for Discharge Goal: PT Additional Goal #2 Outcome: Adequate for Discharge

## 2024-05-06 NOTE — TOC Progression Note (Signed)
 Transition of Care [facility]) - Progression Note    Patient Details  Name: [name] MRN: [id number] Date of Birth: 01-Apr-1966  [facility]) CM/SW Contact  Tandy Fam, [name] Phone Number: 05/06/2024, 3:24 PM  Clinical Narrative:   Requested documentation sent to Debra with [name] to assist with patient's care needs. CSW to follow.    Expected Discharge Plan: Skilled Nursing Facility Barriers to Discharge: English as a second language teacher, Active Substance Use - Placement, SNF Pending bed offer  Expected Discharge Plan and Services       Living arrangements for the past 2 months: Apartment Expected Discharge Date: 03/14/24                                     Social Determinants of Health (SDOH) Interventions SDOH Screenings   Food Insecurity: Food Insecurity Present (03/06/2024)  Housing: High Risk (03/06/2024)  Transportation Needs: No Transportation Needs (03/06/2024)  Utilities: Patient Declined (03/06/2024)  Alcohol Screen: Medium Risk (06/24/2019)  Depression (PHQ2-9): Low Risk  (02/18/2024)  Recent Concern: Depression (PHQ2-9) - Medium Risk (01/21/2024)  Financial Resource Strain: High Risk (07/24/2021)   Received from [facility], [facility]  Physical Activity: Inactive (02/18/2024)  Social Connections: Unknown (04/05/2022)   Received [facility], [facility]  Stress: Stress Concern Present (07/24/2021)   Received from [facility], [facility]  Tobacco Use: High Risk (03/04/2024)  Health Literacy: Adequate Health Literacy (02/18/2024)    Readmission Risk Interventions    12/21/2023    9:25 AM 12/09/2022    9:02 AM 12/01/2022    3:56 PM  Readmission Risk Prevention Plan  Transportation Screening Complete Complete Complete  HRI or Home Care Consult Complete    Social Work Consult for Recovery Care Planning/Counseling Complete    Palliative Care Screening Not Applicable    Medication Review Oceanographer) Complete Complete Complete  PCP  or Specialist appointment within 3-5 days of discharge  Complete Complete  HRI or Home Care Consult  Complete Complete  SW Recovery Care/Counseling Consult  Complete Complete  Palliative Care Screening  Not Applicable Not Applicable  Skilled Nursing Facility  Not Applicable Not Applicable  SNF Comments   n/a

## 2024-05-06 NOTE — TOC Progression Note (Signed)
 Transition of Care [facility]) - Progression Note    Patient Details  Name: [name] MRN: [id number] Date of Birth: 1966/06/24  [facility]) CM/SW Contact  Tandy Fam, [name] Phone Number: 05/06/2024, 3:20 PM  Clinical Narrative:   CSW met with Larinda Plover with [employer], who manages patient's housing. [name] no longer feels that patient is appropriate for housing assistance through them; he is being evicted and they do not believe he is capable of living on his own, [employer] will not support him in paying rent for an apartment moving forward. Larinda Plover had met with patient and informed him of that. Chris sent CSW the documentation of patient's eviction notice, he was evicted due to inappropriate behaviors and was notified on multiple occasions that he was violating policies of the apartment building.   CSW also received contact information from [place] with [employer], part of Complex Transitional Care Team. Per Larinda Plover and Abran Abrahams, the patient is on the radar of everyone at [name] to try to assist as they can. Debra requesting medical records documenting patient's lack of capacity as well as participation with therapy and current recommendations. CSW to send updated notes to [facility] for assistance.    Expected Discharge Plan: Skilled Nursing Facility Barriers to Discharge: English as a second language teacher, Active Substance Use - Placement, SNF Pending bed offer  Expected Discharge Plan and Services       Living arrangements for the past 2 months: Apartment Expected Discharge Date: 03/14/24                                     Social Determinants of Health (SDOH) Interventions SDOH Screenings   Food Insecurity: Food Insecurity Present (03/06/2024)  Housing: High Risk (03/06/2024)  Transportation Needs: No Transportation Needs (03/06/2024)  Utilities: Patient Declined (03/06/2024)  Alcohol Screen: Medium Risk (06/24/2019)  Depression (PHQ2-9): Low Risk  (02/18/2024)  Recent Concern:  Depression (PHQ2-9) - Medium Risk (01/21/2024)  Financial Resource Strain: High Risk (07/24/2021)   Received from [facility]  Physical Activity: Inactive (02/18/2024)  Social Connections: Unknown (04/05/2022)   Received from [facility], [facility]  Stress: Stress Concern Present (07/24/2021)   Received from [facility], [facility]  Tobacco Use: High Risk (03/04/2024)  Health Literacy: Adequate Health Literacy (02/18/2024)    Readmission Risk Interventions    12/21/2023    9:25 AM 12/09/2022    9:02 AM 12/01/2022    3:56 PM  Readmission Risk Prevention Plan  Transportation Screening Complete Complete Complete  HRI or Home Care Consult Complete    Social Work Consult for Recovery Care Planning/Counseling Complete    Palliative Care Screening Not Applicable    Medication Review Oceanographer) Complete Complete Complete  PCP or Specialist appointment within 3-5 days of discharge  Complete Complete  HRI or Home Care Consult  Complete Complete  SW Recovery Care/Counseling Consult  Complete Complete  Palliative Care Screening  Not Applicable Not Applicable  Skilled Nursing Facility  Not Applicable Not Applicable  SNF Comments   n/a

## 2024-05-06 NOTE — Progress Notes (Signed)
 PROGRESS NOTE    [name]  ZOX:[id number] DOB: Jun 01, 1966 DOA: 03/04/2024 PCP: Abraham Abo, MD    Brief Narrative:   58 y.o. male with a history of cardiomyopathy, cocaine abuse comes into the ED on 03/04/2024 for slurred speech, confusion and right-sided weakness, CT of the head showed left MCA infarct, CTA of the head and neck revealed an M2 occlusion on the left, echocardiogram revealed large mobile left ventricular thrombus on the left and a right ventricular apical thrombus on the right, EF was less than 20%.  Cardiology was consulted recommended a DOAC.  PT evaluated the patient recommended skilled nursing facility placement.  Patient was found to lack medical decision capacity such that he is not amenable to determine his discharge disposition or leave AGAINST MEDICAL ADVICE.     Over the last 1 week he has been noncompliant with his medication and refusing physical examination, he is also been refusing his oral meds.  TOC is pursuing guardianship with APS as he lacks medical decisional capacity.  Psychiatry presently recommending to await APS decision for guardianship before discharging home.  Patient does not qualify for forced medication administration  Assessment & Plan:  Principal Problem:   Acute CVA (cerebrovascular accident) (HCC) Active Problems:   LV (left ventricular) mural thrombus   Chronic systolic CHF (congestive heart failure) (HCC)   Prolonged QT interval   Elevated liver enzymes   COPD (chronic obstructive pulmonary disease) (HCC)   Cocaine abuse (HCC)   Schizoaffective disorder, bipolar type (HCC)   Benign essential HTN   Acid reflux   History of DVT (deep vein thrombosis)   Type 2 diabetes mellitus (HCC)   Status post bilateral BKA.   Dilated cardiomyopathy (HCC)     Acute CVA (cerebrovascular accident) (HCC) -He underwent extensive workup and imaging revealed left MCA territory infarct, caudate nucleus, lentiform nucleus and internal capsule MRI  confirmed. -TEE was noted for large mobile left lower clot just under the mitral valve with an EF of 20% and a small right apical clot with reduced RV systolic function. -He was started on Eliquis  for intracardiac thrombus, however he remained noncompliant.  In order to improve compliance, medicine changed to Xarelto  for once daily dosing.  -Psychiatry has deemed the patient to lack capacity and resist compliant with care.   -There is no safe way to force medication on him but he refuses to take it as a result nursing staff and physician simply make multiple attempts throughout the day to convince the patient of the need to comply with his medical therapy. -Does not qualify for forced medication.   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 Last 2D echo showed an EF of less than 20%. -Medication adjusted for better adherence.  Eliquis  changed to Xarelto , Coreg  long-acting.   Schizoaffective disorder bipolar disorder lacks medical capacity: Psych was consulted and recommended that the patient lacks medical capacity to make decisions if the patient attempts to leave AGAINST MEDICAL ADVICE he should be involuntarily committed. Psych recommending APS guardianship, TOC in touch with APS regarding this as patient lacks medical capacity   Essential hypertension:  Pressures are labile.  Currently on Coreg , Lasix , losartan , Aldactone .  Intermittent noncompliance  Hyperlipidemia: Continue Crestor .   Diabetes mellitus type 2:  last A1c of 5.4. refuses monitoring    Polysubstance abuse: UDS was positive for cocaine and benzos.   Deconditioning: Ambulatory status complicated by previous right AKA and left BKA has no  insurance coverage for SNF. - refusing therapy at times   History of COPD: Continue inhalers.   History of DVT: On Xarelto  but frequently refuses   Estimated body mass index is 20.09 kg/m  as calculated from the following:   Height as of this encounter: 5\' 9"  (1.753 m).   Weight as of this encounter: 61.7 kg.   Unable to perform labs due to patient's refusal   DVT prophylaxis: xarelto   Code Status: Full code Family Communication: No family at bedside Disposition Plan:  Status is: Inpatient Remains inpatient appropriate because: Awaiting safe disposition plan   Consultants:  Psych Cardiology Neurology   Subjective: Sitting on his bed listening to music Does not have any complaints   Examination:  General exam: Appears calm and comfortable  Respiratory system: Clear to auscultation. Respiratory effort normal. Cardiovascular system: S1 & S2 heard, RRR. No JVD, murmurs, rubs, gallops or clicks. No pedal edema. Gastrointestinal system: Abdomen is nondistended, soft and nontender. No organomegaly or masses felt. Normal bowel sounds heard. Central nervous system: Alert and oriented. No focal neurological deficits. Extremities: Symmetric 5 x 5 power. Skin: No rashes, lesions or ulcers Psychiatry: Judgement and insight appear normal. Mood & affect appropriate.                Diet Orders (From admission, onward)     Start     Ordered   04/07/24 1155  Diet regular Fluid consistency: Thin  Diet effective now       Question:  Fluid consistency:  Answer:  Thin   04/07/24 1154   03/14/24 0000  Diet - low sodium heart healthy        03/14/24 1443            Objective: Vitals:   05/04/24 2217 05/05/24 0852 05/05/24 2040 05/06/24 0807  BP: (!) 143/107  (!) 139/93 (!) 144/109  Pulse: 71 70 72 63  Resp: 15 18 18    Temp: 98.7 F (37.1 C)     TempSrc: Oral     SpO2: 99% 96% 98% 100%  Weight:      Height:        Intake/Output Summary (Last 24 hours) at 05/06/2024 0900 Last data filed at 05/05/2024 2145 Gross per 24 hour  Intake 680 ml  Output 1150 ml  Net -470 ml   Filed Weights   03/26/24 0431 03/28/24 0456 04/04/24 0500  Weight: 42.7 kg 47.5  kg 61.7 kg    Scheduled Meds:  carbamide peroxide  5 drop Both EARS BID   carvedilol   10 mg Oral Daily   furosemide   40 mg Oral Daily   gabapentin   200 mg Oral BID   losartan   100 mg Oral Daily   revefenacin   175 mcg Nebulization Daily   rivaroxaban   20 mg Oral Q supper   rosuvastatin   5 mg Oral QHS   senna  1 tablet Oral Daily   spironolactone   25 mg Oral Daily   Continuous Infusions:  Nutritional status     Body mass index is 20.09 kg/m.  Data Reviewed:   CBC: No results for input(s): "WBC", "NEUTROABS", "HGB", "HCT", "MCV", "PLT" in the last 168 hours. Basic Metabolic Panel: No results for input(s): "NA", "K", "CL", "CO2", "GLUCOSE", "BUN", "CREATININE", "CALCIUM ", "MG", "PHOS" in the last 168 hours. GFR: Estimated Creatinine Clearance: 79.1 mL/min (by C-G formula based on SCr of 0.91 mg/dL). Liver Function Tests: No results for input(s): "AST", "ALT", "ALKPHOS", "BILITOT", "PROT", "ALBUMIN" in the last 168  hours. No results for input(s): "LIPASE", "AMYLASE" in the last 168 hours. No results for input(s): "AMMONIA" in the last 168 hours. Coagulation Profile: No results for input(s): "INR", "PROTIME" in the last 168 hours. Cardiac Enzymes: No results for input(s): "CKTOTAL", "CKMB", "CKMBINDEX", "TROPONINI" in the last 168 hours. BNP (last 3 results) No results for input(s): "PROBNP" in the last 8760 hours. HbA1C: No results for input(s): "HGBA1C" in the last 72 hours. CBG: No results for input(s): "GLUCAP" in the last 168 hours. Lipid Profile: No results for input(s): "CHOL", "HDL", "LDLCALC", "TRIG", "CHOLHDL", "LDLDIRECT" in the last 72 hours. Thyroid  Function Tests: No results for input(s): "TSH", "T4TOTAL", "FREET4", "T3FREE", "THYROIDAB" in the last 72 hours. Anemia Panel: No results for input(s): "VITAMINB12", "FOLATE", "FERRITIN", "TIBC", "IRON", "RETICCTPCT" in the last 72 hours. Sepsis Labs: No results for input(s): "PROCALCITON", "LATICACIDVEN" in the  last 168 hours.  No results found for this or any previous visit (from the past 240 hours).       Radiology Studies: No results found.         LOS: 63 days   Time spent= 35 mins    Maggie Schooner, MD [facility]  If 7PM-7AM, please contact night-coverage  05/06/2024, 9:00 AM

## 2024-05-07 DIAGNOSIS — I639 Cerebral infarction, unspecified: Secondary | ICD-10-CM | POA: Diagnosis not present

## 2024-05-07 MED ORDER — CARBAMIDE PEROXIDE 6.5 % OT SOLN
5.0000 [drp] | Freq: Two times a day (BID) | OTIC | Status: AC
  Administered 2024-05-07 (×2): 5 [drp] via OTIC
  Filled 2024-05-07: qty 15

## 2024-05-07 NOTE — Progress Notes (Signed)
 PROGRESS NOTE    [name]  ZOX:[id number] DOB: 1966-06-19 DOA: 03/04/2024 PCP: Abraham Abo, MD    Brief Narrative:   58 y.o. male with a history of cardiomyopathy, cocaine abuse comes into the ED on 03/04/2024 for slurred speech, confusion and right-sided weakness, CT of the head showed left MCA infarct, CTA of the head and neck revealed an M2 occlusion on the left, echocardiogram revealed large mobile left ventricular thrombus on the left and a right ventricular apical thrombus on the right, EF was less than 20%.  Cardiology was consulted recommended a DOAC.  PT evaluated the patient recommended skilled nursing facility placement.  Patient was found to lack medical decision capacity such that he is not amenable to determine his discharge disposition or leave AGAINST MEDICAL ADVICE.     Over the last 1 week he has been noncompliant with his medication and refusing physical examination, he is also been refusing his oral meds.  TOC is pursuing guardianship with APS as he lacks medical decisional capacity.  Psychiatry presently recommending to await APS decision for guardianship before discharging home.  Patient does not qualify for forced medication administration  Assessment & Plan:  Principal Problem:   Acute CVA (cerebrovascular accident) (HCC) Active Problems:   LV (left ventricular) mural thrombus   Chronic systolic CHF (congestive heart failure) (HCC)   Prolonged QT interval   Elevated liver enzymes   COPD (chronic obstructive pulmonary disease) (HCC)   Cocaine abuse (HCC)   Schizoaffective disorder, bipolar type (HCC)   Benign essential HTN   Acid reflux   History of DVT (deep vein thrombosis)   Type 2 diabetes mellitus (HCC)   Status post bilateral BKA.   Dilated cardiomyopathy (HCC)     Acute CVA (cerebrovascular accident) (HCC) -He underwent extensive workup and imaging revealed left MCA territory infarct, caudate nucleus, lentiform nucleus and internal capsule MRI  confirmed. -TEE was noted for large mobile left lower clot just under the mitral valve with an EF of 20% and a small right apical clot with reduced RV systolic function. -He was started on Eliquis  for intracardiac thrombus, however he remained noncompliant.  In order to improve compliance, medicine changed to Xarelto  for once daily dosing.  -Psychiatry has deemed the patient to lack capacity and resist compliant with care.   -There is no safe way to force medication on him but he refuses to take it as a result nursing staff and physician simply make multiple attempts throughout the day to convince the patient of the need to comply with his medical therapy. -Does not qualify for forced medication.   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 Last 2D echo showed an EF of less than 20%. -Medication adjusted for better adherence.  Eliquis  changed to Xarelto , Coreg  long-acting.   Schizoaffective disorder bipolar disorder lacks medical capacity: Psych was consulted and recommended that the patient lacks medical capacity to make decisions if the patient attempts to leave AGAINST MEDICAL ADVICE he should be involuntarily committed. Psych recommending APS guardianship, TOC in touch with APS regarding this as patient lacks medical capacity   Essential hypertension:  Pressures are labile.  Currently on Coreg , Lasix , losartan , Aldactone .  Intermittent noncompliance  Hyperlipidemia: Continue Crestor .   Diabetes mellitus type 2:  last A1c of 5.4. refuses monitoring    Polysubstance abuse: UDS was positive for cocaine and benzos.   Deconditioning: Ambulatory status complicated by previous right AKA and left BKA has no  insurance coverage for SNF. - refusing therapy at times   History of COPD: Continue inhalers.   History of DVT: On Xarelto  but frequently refuses   Estimated body mass index is 20.09 kg/m  as calculated from the following:   Height as of this encounter: 5\' 9"  (1.753 m).   Weight as of this encounter: 61.7 kg.   Unable to perform labs due to patient's refusal   DVT prophylaxis: xarelto   Code Status: Full code Family Communication: No family at bedside Disposition Plan:  Status is: Inpatient Remains inpatient appropriate because: Awaiting safe disposition plan   Consultants:  Psych Cardiology Neurology   Subjective: Sitting on his bed listening to music Does not have any complaints Continues to refuse his medications   Examination:  General exam: Appears calm and comfortable  Respiratory system: Clear to auscultation. Respiratory effort normal. Cardiovascular system: S1 & S2 heard, RRR. No JVD, murmurs, rubs, gallops or clicks. No pedal edema. Gastrointestinal system: Abdomen is nondistended, soft and nontender. No organomegaly or masses felt. Normal bowel sounds heard. Central nervous system: Alert and oriented. No focal neurological deficits. Extremities: Symmetric 5 x 5 power. Skin: No rashes, lesions or ulcers Psychiatry: Judgement and insight appear normal. Mood & affect appropriate.                Diet Orders (From admission, onward)     Start     Ordered   04/07/24 1155  Diet regular Fluid consistency: Thin  Diet effective now       Question:  Fluid consistency:  Answer:  Thin   04/07/24 1154   03/14/24 0000  Diet - low sodium heart healthy        03/14/24 1443            Objective: Vitals:   05/05/24 2040 05/06/24 0807 05/06/24 2055 05/07/24 0925  BP: (!) 139/93 (!) 144/109 134/88 (!) 143/104  Pulse: 72 63 72 73  Resp: 18     Temp:      TempSrc:      SpO2: 98% 100% 100% 100%  Weight:      Height:       No intake or output data in the 24 hours ending 05/07/24 1008 Filed Weights   03/26/24 0431 03/28/24 0456 04/04/24 0500  Weight: 42.7 kg 47.5 kg 61.7 kg    Scheduled Meds:  carbamide peroxide  5 drop Both EARS BID    carvedilol   10 mg Oral Daily   furosemide   40 mg Oral Daily   gabapentin   200 mg Oral BID   losartan   100 mg Oral Daily   revefenacin   175 mcg Nebulization Daily   rivaroxaban   20 mg Oral Q supper   rosuvastatin   5 mg Oral QHS   spironolactone   25 mg Oral Daily   Continuous Infusions:  Nutritional status     Body mass index is 20.09 kg/m.  Data Reviewed:   CBC: No results for input(s): "WBC", "NEUTROABS", "HGB", "HCT", "MCV", "PLT" in the last 168 hours. Basic Metabolic Panel: No results for input(s): "NA", "K", "CL", "CO2", "GLUCOSE", "BUN", "CREATININE", "CALCIUM ", "MG", "PHOS" in the last 168 hours. GFR: CrCl cannot be calculated (Patient's most recent lab result is older than the maximum 21 days allowed.). Liver Function Tests: No results for input(s): "AST", "ALT", "ALKPHOS", "BILITOT", "PROT", "ALBUMIN" in the last 168 hours. No results for input(s): "LIPASE", "AMYLASE" in the last 168 hours. No results for input(s): "AMMONIA" in the last 168 hours. Coagulation Profile:  No results for input(s): "INR", "PROTIME" in the last 168 hours. Cardiac Enzymes: No results for input(s): "CKTOTAL", "CKMB", "CKMBINDEX", "TROPONINI" in the last 168 hours. BNP (last 3 results) No results for input(s): "PROBNP" in the last 8760 hours. HbA1C: No results for input(s): "HGBA1C" in the last 72 hours. CBG: No results for input(s): "GLUCAP" in the last 168 hours. Lipid Profile: No results for input(s): "CHOL", "HDL", "LDLCALC", "TRIG", "CHOLHDL", "LDLDIRECT" in the last 72 hours. Thyroid  Function Tests: No results for input(s): "TSH", "T4TOTAL", "FREET4", "T3FREE", "THYROIDAB" in the last 72 hours. Anemia Panel: No results for input(s): "VITAMINB12", "FOLATE", "FERRITIN", "TIBC", "IRON", "RETICCTPCT" in the last 72 hours. Sepsis Labs: No results for input(s): "PROCALCITON", "LATICACIDVEN" in the last 168 hours.  No results found for this or any previous visit (from the past 240  hours).       Radiology Studies: No results found.         LOS: 64 days   Time spent= 35 mins    Maggie Schooner, MD [facility]  If 7PM-7AM, please contact night-coverage  05/07/2024, 10:08 AM

## 2024-05-08 DIAGNOSIS — I639 Cerebral infarction, unspecified: Secondary | ICD-10-CM | POA: Diagnosis not present

## 2024-05-08 NOTE — Plan of Care (Signed)

## 2024-05-08 NOTE — Plan of Care (Signed)
  Problem: Education: Goal: Knowledge of General Education information will improve Description: Including pain rating scale, medication(s)/side effects and non-pharmacologic comfort measures 05/08/2024 0438 by Chrys Cranker, RN Outcome: Not Progressing 05/08/2024 0421 by Chrys Cranker, RN Outcome: Not Progressing   Problem: Health Behavior/Discharge Planning: Goal: Ability to manage health-related needs will improve 05/08/2024 0438 by Chrys Cranker, RN Outcome: Not Progressing 05/08/2024 0421 by Chrys Cranker, RN Outcome: Not Progressing   Problem: Clinical Measurements: Goal: Ability to maintain clinical measurements within normal limits will improve 05/08/2024 0438 by Chrys Cranker, RN Outcome: Not Progressing 05/08/2024 0421 by Chrys Cranker, RN Outcome: Not Progressing Goal: Will remain free from infection 05/08/2024 0438 by Chrys Cranker, RN Outcome: Not Progressing 05/08/2024 0421 by Chrys Cranker, RN Outcome: Not Progressing Goal: Diagnostic test results will improve 05/08/2024 0438 by Chrys Cranker, RN Outcome: Not Progressing 05/08/2024 0421 by Chrys Cranker, RN Outcome: Not Progressing Goal: Respiratory complications will improve 05/08/2024 0438 by Chrys Cranker, RN Outcome: Not Progressing 05/08/2024 0421 by Delta Fila D, RN Outcome: Not Progressing Goal: Cardiovascular complication will be avoided 05/08/2024 0438 by Chrys Cranker, RN Outcome: Not Progressing 05/08/2024 0421 by Chrys Cranker, RN Outcome: Not Progressing   Problem: Activity: Goal: Risk for activity intolerance will decrease 05/08/2024 0438 by Chrys Cranker, RN Outcome: Not Progressing 05/08/2024 0421 by Chrys Cranker, RN Outcome: Not Progressing   Problem: Nutrition: Goal: Adequate nutrition will be maintained 05/08/2024 0438 by Chrys Cranker, RN Outcome: Not Progressing 05/08/2024 0421 by Delta Fila D, RN Outcome: Not  Progressing   Problem: Coping: Goal: Level of anxiety will decrease 05/08/2024 0438 by Chrys Cranker, RN Outcome: Not Progressing 05/08/2024 0421 by Delta Fila D, RN Outcome: Not Progressing   Problem: Elimination: Goal: Will not experience complications related to bowel motility 05/08/2024 0438 by Chrys Cranker, RN Outcome: Not Progressing 05/08/2024 0421 by Chrys Cranker, RN Outcome: Not Progressing Goal: Will not experience complications related to urinary retention 05/08/2024 0438 by Chrys Cranker, RN Outcome: Not Progressing 05/08/2024 0421 by Chrys Cranker, RN Outcome: Not Progressing   Problem: Pain Managment: Goal: General experience of comfort will improve and/or be controlled 05/08/2024 0438 by Chrys Cranker, RN Outcome: Not Progressing 05/08/2024 0421 by Chrys Cranker, RN Outcome: Not Progressing   Problem: Safety: Goal: Ability to remain free from injury will improve 05/08/2024 0438 by Chrys Cranker, RN Outcome: Not Progressing 05/08/2024 0421 by Delta Fila D, RN Outcome: Not Progressing   Problem: Skin Integrity: Goal: Risk for impaired skin integrity will decrease 05/08/2024 0438 by Chrys Cranker, RN Outcome: Not Progressing 05/08/2024 0421 by Chrys Cranker, RN Outcome: Not Progressing

## 2024-05-08 NOTE — Progress Notes (Signed)
 PROGRESS NOTE    [name]  UJW:[id number] DOB: [birth year] DOA: 03/04/2024 PCP: Abraham Abo, MD    Brief Narrative:   57 y.o. male with a history of cardiomyopathy, cocaine abuse comes into the ED on 03/04/2024 for slurred speech, confusion and right-sided weakness, CT of the head showed left MCA infarct, CTA of the head and neck revealed an M2 occlusion on the left, echocardiogram revealed large mobile left ventricular thrombus on the left and a right ventricular apical thrombus on the right, EF was less than 20%.  Cardiology was consulted recommended a DOAC.  PT evaluated the patient recommended skilled nursing facility placement.  Patient was found to lack medical decision capacity such that he is not amenable to determine his discharge disposition or leave AGAINST MEDICAL ADVICE.     Over the last 1 week he has been noncompliant with his medication and refusing physical examination, he is also been refusing his oral meds.  TOC is pursuing guardianship with APS as he lacks medical decisional capacity.  Psychiatry presently recommending to await APS decision for guardianship before discharging home.  Patient does not qualify for forced medication administration  Assessment & Plan:  Principal Problem:   Acute CVA (cerebrovascular accident) (HCC) Active Problems:   LV (left ventricular) mural thrombus   Chronic systolic CHF (congestive heart failure) (HCC)   Prolonged QT interval   Elevated liver enzymes   COPD (chronic obstructive pulmonary disease) (HCC)   Cocaine abuse (HCC)   Schizoaffective disorder, bipolar type (HCC)   Benign essential HTN   Acid reflux   History of DVT (deep vein thrombosis)   Type 2 diabetes mellitus (HCC)   Status post bilateral BKA.   Dilated cardiomyopathy (HCC)     Acute CVA (cerebrovascular accident) (HCC) -He underwent extensive workup and imaging revealed left MCA territory infarct, caudate nucleus, lentiform nucleus and internal capsule MRI  confirmed. -TEE was noted for large mobile left lower clot just under the mitral valve with an EF of 20% and a small right apical clot with reduced RV systolic function. -He was started on Eliquis  for intracardiac thrombus, however he remained noncompliant.  In order to improve compliance, medicine changed to Xarelto  for once daily dosing.  -Psychiatry has deemed the patient to lack capacity and resist compliant with care.   -There is no safe way to force medication on him but he refuses to take it as a result nursing staff and physician simply make multiple attempts throughout the day to convince the patient of the need to comply with his medical therapy. -Does not qualify for forced medication.   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 Last 2D echo showed an EF of less than 20%. -Medication adjusted for better adherence.  Eliquis  changed to Xarelto , Coreg  long-acting.   Schizoaffective disorder bipolar disorder lacks medical capacity: Psych was consulted and recommended that the patient lacks medical capacity to make decisions if the patient attempts to leave AGAINST MEDICAL ADVICE he should be involuntarily committed. Psych recommending APS guardianship, TOC in touch with APS regarding this as patient lacks medical capacity   Essential hypertension:  Pressures are labile.  Currently on Coreg , Lasix , losartan , Aldactone .  Intermittent noncompliance  Hyperlipidemia: Continue Crestor .   Diabetes mellitus type 2:  last A1c of 5.4. refuses monitoring    Polysubstance abuse: UDS was positive for cocaine and benzos.   Deconditioning: Ambulatory status complicated by previous right AKA and left BKA has no  insurance coverage for SNF. - refusing therapy at times   History of COPD: Continue inhalers.   History of DVT: On Xarelto  but frequently refuses   Estimated body mass index is 20.09 kg/m  as calculated from the following:   Height as of this encounter: 5\' 9"  (1.753 m).   Weight as of this encounter: 61.7 kg.   Unable to perform labs due to patient's refusal   DVT prophylaxis: xarelto   Code Status: Full code Family Communication: No family at bedside Disposition Plan:  Status is: Inpatient Remains inpatient appropriate because: Awaiting safe disposition plan   Consultants:  Psych Cardiology Neurology   Subjective: Does not have any complaints Continues to refuse his medications   Examination:  General exam: Appears calm and comfortable  Respiratory system: Clear to auscultation. Respiratory effort normal. Cardiovascular system: S1 & S2 heard, RRR. No JVD, murmurs, rubs, gallops or clicks. No pedal edema. Gastrointestinal system: Abdomen is nondistended, soft and nontender. No organomegaly or masses felt. Normal bowel sounds heard. Central nervous system: Alert and oriented. No focal neurological deficits. Extremities: Symmetric 5 x 5 power. Skin: No rashes, lesions or ulcers Psychiatry: Judgement and insight appear normal. Mood & affect appropriate.                Diet Orders (From admission, onward)     Start     Ordered   04/07/24 1155  Diet regular Fluid consistency: Thin  Diet effective now       Question:  Fluid consistency:  Answer:  Thin   04/07/24 1154   03/14/24 0000  Diet - low sodium heart healthy        03/14/24 1443            Objective: Vitals:   05/07/24 2002 05/08/24 0038 05/08/24 0435 05/08/24 0914  BP: (!) 149/102 (!) 159/112 (!) 149/110 125/86  Pulse: 68 78 78 79  Resp: 18 18 18 19   Temp:      TempSrc:      SpO2: 100% 97%  100%  Weight:      Height:       No intake or output data in the 24 hours ending 05/08/24 1022 Filed Weights   03/26/24 0431 03/28/24 0456 04/04/24 0500  Weight: 42.7 kg 47.5 kg 61.7 kg    Scheduled Meds:  carvedilol   10 mg Oral Daily   furosemide   40 mg Oral Daily   gabapentin    200 mg Oral BID   losartan   100 mg Oral Daily   revefenacin   175 mcg Nebulization Daily   rivaroxaban   20 mg Oral Q supper   rosuvastatin   5 mg Oral QHS   spironolactone   25 mg Oral Daily   Continuous Infusions:  Nutritional status     Body mass index is 20.09 kg/m.  Data Reviewed:   CBC: No results for input(s): "WBC", "NEUTROABS", "HGB", "HCT", "MCV", "PLT" in the last 168 hours. Basic Metabolic Panel: No results for input(s): "NA", "K", "CL", "CO2", "GLUCOSE", "BUN", "CREATININE", "CALCIUM ", "MG", "PHOS" in the last 168 hours. GFR: CrCl cannot be calculated (Patient's most recent lab result is older than the maximum 21 days allowed.). Liver Function Tests: No results for input(s): "AST", "ALT", "ALKPHOS", "BILITOT", "PROT", "ALBUMIN" in the last 168 hours. No results for input(s): "LIPASE", "AMYLASE" in the last 168 hours. No results for input(s): "AMMONIA" in the last 168 hours. Coagulation Profile: No results for input(s): "INR", "PROTIME" in the last 168 hours. Cardiac Enzymes: No results for input(s): "  CKTOTAL", "CKMB", "CKMBINDEX", "TROPONINI" in the last 168 hours. BNP (last 3 results) No results for input(s): "PROBNP" in the last 8760 hours. HbA1C: No results for input(s): "HGBA1C" in the last 72 hours. CBG: No results for input(s): "GLUCAP" in the last 168 hours. Lipid Profile: No results for input(s): "CHOL", "HDL", "LDLCALC", "TRIG", "CHOLHDL", "LDLDIRECT" in the last 72 hours. Thyroid  Function Tests: No results for input(s): "TSH", "T4TOTAL", "FREET4", "T3FREE", "THYROIDAB" in the last 72 hours. Anemia Panel: No results for input(s): "VITAMINB12", "FOLATE", "FERRITIN", "TIBC", "IRON", "RETICCTPCT" in the last 72 hours. Sepsis Labs: No results for input(s): "PROCALCITON", "LATICACIDVEN" in the last 168 hours.  No results found for this or any previous visit (from the past 240 hours).       Radiology Studies: No results found.         LOS: 65  days   Time spent= 35 mins    Maggie Schooner, MD [facility]  If 7PM-7AM, please contact night-coverage  05/08/2024, 10:22 AM

## 2024-05-09 DIAGNOSIS — I639 Cerebral infarction, unspecified: Secondary | ICD-10-CM | POA: Diagnosis not present

## 2024-05-09 NOTE — Progress Notes (Signed)
 PROGRESS NOTE    [name]  ZOX:[id number] DOB: May 28, 1966 DOA: 03/04/2024 PCP: Abraham Abo, MD    Brief Narrative:   58 y.o. male with a history of cardiomyopathy, cocaine abuse comes into the ED on 03/04/2024 for slurred speech, confusion and right-sided weakness, CT of the head showed left MCA infarct, CTA of the head and neck revealed an M2 occlusion on the left, echocardiogram revealed large mobile left ventricular thrombus on the left and a right ventricular apical thrombus on the right, EF was less than 20%.  Cardiology was consulted recommended a DOAC.  PT evaluated the patient recommended skilled nursing facility placement.  Patient was found to lack medical decision capacity such that he is not amenable to determine his discharge disposition or leave AGAINST MEDICAL ADVICE.     Over the last 1 week he has been noncompliant with his medication and refusing physical examination, he is also been refusing his oral meds.  TOC is pursuing guardianship with APS as he lacks medical decisional capacity.  Psychiatry presently recommending to await APS decision for guardianship before discharging home.  Patient does not qualify for forced medication administration  Assessment & Plan:  Principal Problem:   Acute CVA (cerebrovascular accident) (HCC) Active Problems:   LV (left ventricular) mural thrombus   Chronic systolic CHF (congestive heart failure) (HCC)   Prolonged QT interval   Elevated liver enzymes   COPD (chronic obstructive pulmonary disease) (HCC)   Cocaine abuse (HCC)   Schizoaffective disorder, bipolar type (HCC)   Benign essential HTN   Acid reflux   History of DVT (deep vein thrombosis)   Type 2 diabetes mellitus (HCC)   Status post bilateral BKA.   Dilated cardiomyopathy (HCC)     Acute CVA (cerebrovascular accident) (HCC) -He underwent extensive workup and imaging revealed left MCA territory infarct, caudate nucleus, lentiform nucleus and internal capsule MRI  confirmed. -TEE was noted for large mobile left lower clot just under the mitral valve with an EF of 20% and a small right apical clot with reduced RV systolic function. -He was started on Eliquis  for intracardiac thrombus, however he remained noncompliant.  In order to improve compliance, medicine changed to Xarelto  for once daily dosing.  -Psychiatry has deemed the patient to lack capacity and resist compliant with care.   -There is no safe way to force medication on him but he refuses to take it as a result nursing staff and physician simply make multiple attempts throughout the day to convince the patient of the need to comply with his medical therapy. -Does not qualify for forced medication.   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 Last 2D echo showed an EF of less than 20%. -Medication adjusted for better adherence.  Eliquis  changed to Xarelto , Coreg  long-acting.   Schizoaffective disorder bipolar disorder lacks medical capacity: Psych was consulted and recommended that the patient lacks medical capacity to make decisions if the patient attempts to leave AGAINST MEDICAL ADVICE he should be involuntarily committed. Psych recommending APS guardianship, TOC in touch with APS regarding this as patient lacks medical capacity   Essential hypertension:  Pressures are labile.  Currently on Coreg , Lasix , losartan , Aldactone .  Intermittent noncompliance  Hyperlipidemia: Continue Crestor .   Diabetes mellitus type 2:  last A1c of 5.4. refuses monitoring    Polysubstance abuse: UDS was positive for cocaine and benzos.   Deconditioning: Ambulatory status complicated by previous right AKA and left BKA has no  insurance coverage for SNF. - refusing therapy at times   History of COPD: Continue inhalers.   History of DVT: On Xarelto  but frequently refuses   Estimated body mass index is 20.09 kg/m  as calculated from the following:   Height as of this encounter: 5\' 9"  (1.753 m).   Weight as of this encounter: 61.7 kg.   Unable to perform labs due to patient's refusal   DVT prophylaxis: xarelto   Code Status: Full code Family Communication: No family at bedside Disposition Plan:  Status is: Inpatient Remains inpatient appropriate because: Awaiting safe disposition plan   Consultants:  Psych Cardiology Neurology   Subjective: Does not have any complaints Continues to refuse his medications   Examination:  General exam: Appears calm and comfortable  Respiratory system: Clear to auscultation. Respiratory effort normal. Cardiovascular system: S1 & S2 heard, RRR. No JVD, murmurs, rubs, gallops or clicks. No pedal edema. Gastrointestinal system: Abdomen is nondistended, soft and nontender. No organomegaly or masses felt. Normal bowel sounds heard. Central nervous system: Alert and oriented. No focal neurological deficits. Extremities: Symmetric 5 x 5 power. Skin: No rashes, lesions or ulcers Psychiatry: Judgement and insight appear normal. Mood & affect appropriate.                Diet Orders (From admission, onward)     Start     Ordered   04/07/24 1155  Diet regular Fluid consistency: Thin  Diet effective now       Question:  Fluid consistency:  Answer:  Thin   04/07/24 1154   03/14/24 0000  Diet - low sodium heart healthy        03/14/24 1443            Objective: Vitals:   05/08/24 2027 05/09/24 0038 05/09/24 0431 05/09/24 0901  BP: (!) 150/96 (!) 143/125 115/81 (!) 143/102  Pulse: 72 80 78 72  Resp: 18 18 15  (!) 22  Temp:    98.7 F (37.1 C)  TempSrc:      SpO2: 100% 96% 100% 97%  Weight:      Height:        Intake/Output Summary (Last 24 hours) at 05/09/2024 1206 Last data filed at 05/08/2024 1814 Gross per 24 hour  Intake --  Output 150 ml  Net -150 ml   Filed Weights   03/26/24 0431 03/28/24 0456 04/04/24 0500  Weight: 42.7 kg  47.5 kg 61.7 kg    Scheduled Meds:  carvedilol   10 mg Oral Daily   furosemide   40 mg Oral Daily   gabapentin   200 mg Oral BID   losartan   100 mg Oral Daily   revefenacin   175 mcg Nebulization Daily   rivaroxaban   20 mg Oral Q supper   rosuvastatin   5 mg Oral QHS   spironolactone   25 mg Oral Daily   Continuous Infusions:  Nutritional status     Body mass index is 20.09 kg/m.  Data Reviewed:   CBC: No results for input(s): "WBC", "NEUTROABS", "HGB", "HCT", "MCV", "PLT" in the last 168 hours. Basic Metabolic Panel: No results for input(s): "NA", "K", "CL", "CO2", "GLUCOSE", "BUN", "CREATININE", "CALCIUM ", "MG", "PHOS" in the last 168 hours. GFR: CrCl cannot be calculated (Patient's most recent lab result is older than the maximum 21 days allowed.). Liver Function Tests: No results for input(s): "AST", "ALT", "ALKPHOS", "BILITOT", "PROT", "ALBUMIN" in the last 168 hours. No results for input(s): "LIPASE", "AMYLASE" in the last 168 hours. No results for input(s): "AMMONIA"  in the last 168 hours. Coagulation Profile: No results for input(s): "INR", "PROTIME" in the last 168 hours. Cardiac Enzymes: No results for input(s): "CKTOTAL", "CKMB", "CKMBINDEX", "TROPONINI" in the last 168 hours. BNP (last 3 results) No results for input(s): "PROBNP" in the last 8760 hours. HbA1C: No results for input(s): "HGBA1C" in the last 72 hours. CBG: No results for input(s): "GLUCAP" in the last 168 hours. Lipid Profile: No results for input(s): "CHOL", "HDL", "LDLCALC", "TRIG", "CHOLHDL", "LDLDIRECT" in the last 72 hours. Thyroid  Function Tests: No results for input(s): "TSH", "T4TOTAL", "FREET4", "T3FREE", "THYROIDAB" in the last 72 hours. Anemia Panel: No results for input(s): "VITAMINB12", "FOLATE", "FERRITIN", "TIBC", "IRON", "RETICCTPCT" in the last 72 hours. Sepsis Labs: No results for input(s): "PROCALCITON", "LATICACIDVEN" in the last 168 hours.  No results found for this or any  previous visit (from the past 240 hours).       Radiology Studies: No results found.         LOS: 66 days   Time spent= 35 mins    Maggie Schooner, MD [facility]  If 7PM-7AM, please contact night-coverage  05/09/2024, 12:06 PM

## 2024-05-09 NOTE — Progress Notes (Signed)
 Pt refused lasix  and spironolactone  this morning. Attempted to education on meds but pt was not interested.

## 2024-05-09 NOTE — Progress Notes (Signed)
 Pt offered laxative but became angry when asked when he had his last bm.  He stated "don't ask me that question! I don't like it!" Per chart last bm was on 6/6,

## 2024-05-09 NOTE — Progress Notes (Signed)
 Physical Therapy Treatment Patient Details Name: [name] MRN: [id number] DOB: Jun 15, 1966 Today's Date: 05/09/2024   History of Present Illness Patient is 58 y.o. male presented to ED on 03/04/24 with stroke like symptoms when caregiver found him with slurred speech and right sided weakness. Head CT reveals Acute left MCA territory infarct affecting the insula, caudate nucleus, lentiform nucleus and internal capsule. PMH significant of  Schizoaffective disorder, heart failure with reduced ejection fraction, pretension, DMII, COPD, s/p bilateral amputations, chronic pain, and polysubstance abuse.    PT Comments  Continuing work on functional mobility and increasing functional independence; [name] has voiced that his goal is to be able to use his right arm more and with greater dexterity; we opted to go to the [employer], to get the benefit of changing scenery for [name], and also to work on components of functional wheelchair transfers and floor transfers at the mat table; [name] performed right upper extremity push-up type exercises that included an element of anterior weight shift, leaning forward to putting his right arm/hand onto the mat table in front of him;  I'm not sure how or when it could happen, but to get his own wheelchair and presses here would be great for practicing and working towards his goals donning the liner and prosthesis will get at upper extremity and hand coordination, and to have the prosthesis on will give [name] a pivot point on the floor and give him more repertoire for weight shift during transfers; will continue efforts   If plan is discharge home, recommend the following: Supervision due to cognitive status;Direct supervision/assist for financial management;Direct supervision/assist for medications management   Can travel by private vehicle        Equipment Recommendations  None recommended by PT    Recommendations for Other Services OT consult;Speech consult      Precautions / Restrictions Precautions Precautions: Fall Recall of Precautions/Restrictions: Impaired Precaution/Restrictions Comments: h/o BLE amputations     Mobility  Bed Mobility Overal bed mobility: Modified Independent                  Transfers Overall transfer level: Needs assistance Equipment used: None Transfers: Bed to chair/wheelchair/BSC             General transfer comment: Supervision for safety    Ambulation/Gait                   Psychologist, counselling mobility: Yes Wheelchair propulsion: Both upper extremities Wheelchair parts: Supervision/cueing Distance: 200 Wheelchair Assistance Details (indicate cue type and reason): inefficient pushes   Tilt Bed    Modified Rankin (Stroke Patients Only) Modified Rankin (Stroke Patients Only) Pre-Morbid Rankin Score: Slight disability Modified Rankin: Moderate disability     Balance     Sitting balance-Leahy Scale: Good                                      Communication Communication Communication: Impaired  Cognition Arousal: Alert Behavior During Therapy: WFL for tasks assessed/performed                           PT - Cognition Comments: Participating in problem-solving; see also sT notes Following commands: Intact Following commands impaired: Follows one step commands inconsistently, Follows one step commands with  increased time (Responds better when commands are presented more like invitations)    Cueing Cueing Techniques: Verbal cues, Tactile cues, Other (comments)  Exercises Other Exercises Other Exercises: Seated, R UE pushups x10; trunk with anterior lean    General Comments General comments (skin integrity, edema, etc.): [name] engaged with staff familiar to him as we rolled to [employer]      Pertinent Vitals/Pain Pain Assessment Pain Assessment: Faces Pain Score: 0-No pain    Home  Living                          Prior Function            PT Goals (current goals can now be found in the care plan section) Acute Rehab PT Goals Patient Stated Goal: would like to be able to get home PT Goal Formulation: With patient Time For Goal Achievement: 05/16/24 Potential to Achieve Goals: Fair Progress towards PT goals: Progressing toward goals    Frequency    Min 2X/week      PT Plan      Co-evaluation              AM-PAC PT "6 Clicks" Mobility   Outcome Measure  Help needed turning from your back to your side while in a flat bed without using bedrails?: None Help needed moving from lying on your back to sitting on the side of a flat bed without using bedrails?: None Help needed moving to and from a bed to a chair (including a wheelchair)?: A Little Help needed standing up from a chair using your arms (e.g., wheelchair or bedside chair)?: Total Help needed to walk in hospital room?: Total Help needed climbing 3-5 steps with a railing? : Total 6 Click Score: 14    End of Session   Activity Tolerance: Patient tolerated treatment well Patient left: Other (comment) (Rolling with Mobility Specialist, who will help him back to his room) Nurse Communication: Mobility status PT Visit Diagnosis: Other abnormalities of gait and mobility (R26.89);Muscle weakness (generalized) (M62.81);Other symptoms and signs involving the nervous system (R29.898);Hemiplegia and hemiparesis Hemiplegia - Right/Left: Right Hemiplegia - dominant/non-dominant: Dominant Hemiplegia - caused by: Cerebral infarction     Time: 1415-1446 PT Time Calculation (min) (ACUTE ONLY): 31 min  Charges:    $Therapeutic Exercise: 8-22 mins $Therapeutic Activity: 8-22 mins PT General Charges $$ ACUTE PT VISIT: 1 Visit                    Darcus Eastern, PT  Acute Rehabilitation Services Office [phone] Secure Chat welcomed    Marcial Setting 05/09/2024, 5:05 PM

## 2024-05-09 NOTE — Progress Notes (Signed)
 Mobility Specialist Progress Note:    05/09/24 1500  Mobility  Activity Transferred from chair to bed  Level of Assistance Standby assist, set-up cues, supervision of patient - no hands on  Assistive Device Wheelchair  Distance Ambulated (ft) 100 ft (propelled in w/c)  Activity Response Tolerated well  Mobility Referral Yes  Mobility visit 1 Mobility  Mobility Specialist Start Time (ACUTE ONLY) 1443  Mobility Specialist Stop Time (ACUTE ONLY) 1510  Mobility Specialist Time Calculation (min) (ACUTE ONLY) 27 min   Pt received in hallway w/ PT and agreeable.  Asymptomatic w/ no complaints throughout. Able to transfer back to bed via anterior scoot. Pt left in bed with bed alarm on and RN present.  D'Vante Nolon Baxter Mobility Specialist Please contact via Special educational needs teacher or Rehab office at [phone]

## 2024-05-10 DIAGNOSIS — I639 Cerebral infarction, unspecified: Secondary | ICD-10-CM | POA: Diagnosis not present

## 2024-05-10 NOTE — Progress Notes (Signed)
 Mobility Specialist Progress Note:    05/10/24 1500  Mobility  Activity Ambulated with assistance in hallway;Transferred from chair to bed (Propelled in w/c)  Level of Assistance Standby assist, set-up cues, supervision of patient - no hands on  Assistive Device Wheelchair  Distance Ambulated (ft) 100 ft  Activity Response Tolerated well  Mobility Referral Yes  Mobility visit 1 Mobility  Mobility Specialist Start Time (ACUTE ONLY) 1332  Mobility Specialist Stop Time (ACUTE ONLY) 1428  Mobility Specialist Time Calculation (min) (ACUTE ONLY) 56 min   Pt received in wheelchair from PT. Asymptomatic w/ no complaints throughout. Able to transfer back to bed via anterior scoot. Pt left in bed with call bell and bed alarm on.  D'Vante Nolon Baxter Mobility Specialist Please contact via Special educational needs teacher or Rehab office at [phone]

## 2024-05-10 NOTE — Progress Notes (Signed)
 PROGRESS NOTE    [name]  ZHY:[id number] DOB: 01-28-1966 DOA: 03/04/2024 PCP: Abraham Abo, MD    Brief Narrative:   58 y.o. male with a history of cardiomyopathy, cocaine abuse comes into the ED on 03/04/2024 for slurred speech, confusion and right-sided weakness, CT of the head showed left MCA infarct, CTA of the head and neck revealed an M2 occlusion on the left, echocardiogram revealed large mobile left ventricular thrombus on the left and a right ventricular apical thrombus on the right, EF was less than 20%.  Cardiology was consulted recommended a DOAC.  PT evaluated the patient recommended skilled nursing facility placement.  Patient was found to lack medical decision capacity such that he is not amenable to determine his discharge disposition or leave AGAINST MEDICAL ADVICE.     Over the last 1 week he has been noncompliant with his medication and refusing physical examination, he is also been refusing his oral meds.  TOC is pursuing guardianship with APS as he lacks medical decisional capacity.  Psychiatry presently recommending to await APS decision for guardianship before discharging home.  Patient does not qualify for forced medication administration  Assessment & Plan:  Principal Problem:   Acute CVA (cerebrovascular accident) (HCC) Active Problems:   LV (left ventricular) mural thrombus   Chronic systolic CHF (congestive heart failure) (HCC)   Prolonged QT interval   Elevated liver enzymes   COPD (chronic obstructive pulmonary disease) (HCC)   Cocaine abuse (HCC)   Schizoaffective disorder, bipolar type (HCC)   Benign essential HTN   Acid reflux   History of DVT (deep vein thrombosis)   Type 2 diabetes mellitus (HCC)   Status post bilateral BKA.   Dilated cardiomyopathy (HCC)     Acute CVA (cerebrovascular accident) (HCC) -He underwent extensive workup and imaging revealed left MCA territory infarct, caudate nucleus, lentiform nucleus and internal capsule MRI  confirmed. -TEE was noted for large mobile left lower clot just under the mitral valve with an EF of 20% and a small right apical clot with reduced RV systolic function. -He was started on Eliquis  for intracardiac thrombus, however he remained noncompliant.  In order to improve compliance, medicine changed to Xarelto  for once daily dosing.  -Psychiatry has deemed the patient to lack capacity and resist compliant with care.   -There is no safe way to force medication on him but he refuses to take it as a result nursing staff and physician simply make multiple attempts throughout the day to convince the patient of the need to comply with his medical therapy. -Does not qualify for forced medication.   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 Last 2D echo showed an EF of less than 20%. -Medication adjusted for better adherence.  Eliquis  changed to Xarelto , Coreg  long-acting.   Schizoaffective disorder bipolar disorder lacks medical capacity: Psych was consulted and recommended that the patient lacks medical capacity to make decisions if the patient attempts to leave AGAINST MEDICAL ADVICE he should be involuntarily committed. Psych recommending APS guardianship, TOC in touch with APS regarding this as patient lacks medical capacity   Essential hypertension:  Pressures are labile.  Currently on Coreg , Lasix , losartan , Aldactone .  Intermittent noncompliance  Hyperlipidemia: Continue Crestor .   Diabetes mellitus type 2:  last A1c of 5.4. refuses monitoring    Polysubstance abuse: UDS was positive for cocaine and benzos.   Deconditioning: Ambulatory status complicated by previous right AKA and left BKA has no  insurance coverage for SNF. - refusing therapy at times   History of COPD: Continue inhalers.   History of DVT: On Xarelto  but frequently refuses   Estimated body mass index is 20.09 kg/m  as calculated from the following:   Height as of this encounter: 5\' 9"  (1.753 m).   Weight as of this encounter: 61.7 kg.   Unable to perform labs due to patient's refusal   DVT prophylaxis: xarelto   Code Status: Full code Family Communication: No family at bedside Disposition Plan:  Status is: Inpatient Remains inpatient appropriate because: Awaiting safe disposition plan   Consultants:  Psych Cardiology Neurology   Subjective: Blood pressure initially elevated but after getting p.o. meds this improved later on.  Examination:  General exam: Appears calm and comfortable  Respiratory system: Clear to auscultation. Respiratory effort normal. Cardiovascular system: S1 & S2 heard, RRR. No JVD, murmurs, rubs, gallops or clicks. No pedal edema. Gastrointestinal system: Abdomen is nondistended, soft and nontender. No organomegaly or masses felt. Normal bowel sounds heard. Central nervous system: Alert and oriented. No focal neurological deficits. Extremities: Bilateral amputations noted Skin: No rashes, lesions or ulcers Psychiatry: Judgement and insight appear poor                Diet Orders (From admission, onward)     Start     Ordered   04/07/24 1155  Diet regular Fluid consistency: Thin  Diet effective now       Question:  Fluid consistency:  Answer:  Thin   04/07/24 1154   03/14/24 0000  Diet - low sodium heart healthy        03/14/24 1443            Objective: Vitals:   05/09/24 1626 05/09/24 2003 05/10/24 0833 05/10/24 1027  BP: 139/87 (!) 149/110 (!) 183/102 (!) 128/96  Pulse: 74 71 81 82  Resp: 20 18 18    Temp:      TempSrc:      SpO2: 99% 100% 97%   Weight:      Height:        Intake/Output Summary (Last 24 hours) at 05/10/2024 1058 Last data filed at 05/09/2024 1300 Gross per 24 hour  Intake 480 ml  Output 600 ml  Net -120 ml   Filed Weights   03/26/24 0431 03/28/24 0456 04/04/24 0500  Weight: 42.7 kg 47.5 kg 61.7 kg    Scheduled  Meds:  carvedilol   10 mg Oral Daily   furosemide   40 mg Oral Daily   gabapentin   200 mg Oral BID   losartan   100 mg Oral Daily   revefenacin   175 mcg Nebulization Daily   rivaroxaban   20 mg Oral Q supper   rosuvastatin   5 mg Oral QHS   spironolactone   25 mg Oral Daily   Continuous Infusions:  Nutritional status     Body mass index is 20.09 kg/m.  Data Reviewed:   CBC: No results for input(s): "WBC", "NEUTROABS", "HGB", "HCT", "MCV", "PLT" in the last 168 hours. Basic Metabolic Panel: No results for input(s): "NA", "K", "CL", "CO2", "GLUCOSE", "BUN", "CREATININE", "CALCIUM ", "MG", "PHOS" in the last 168 hours. GFR: CrCl cannot be calculated (Patient's most recent lab result is older than the maximum 21 days allowed.). Liver Function Tests: No results for input(s): "AST", "ALT", "ALKPHOS", "BILITOT", "PROT", "ALBUMIN" in the last 168 hours. No results for input(s): "LIPASE", "AMYLASE" in the last 168 hours. No results for input(s): "AMMONIA" in the last 168 hours. Coagulation Profile:  No results for input(s): "INR", "PROTIME" in the last 168 hours. Cardiac Enzymes: No results for input(s): "CKTOTAL", "CKMB", "CKMBINDEX", "TROPONINI" in the last 168 hours. BNP (last 3 results) No results for input(s): "PROBNP" in the last 8760 hours. HbA1C: No results for input(s): "HGBA1C" in the last 72 hours. CBG: No results for input(s): "GLUCAP" in the last 168 hours. Lipid Profile: No results for input(s): "CHOL", "HDL", "LDLCALC", "TRIG", "CHOLHDL", "LDLDIRECT" in the last 72 hours. Thyroid  Function Tests: No results for input(s): "TSH", "T4TOTAL", "FREET4", "T3FREE", "THYROIDAB" in the last 72 hours. Anemia Panel: No results for input(s): "VITAMINB12", "FOLATE", "FERRITIN", "TIBC", "IRON", "RETICCTPCT" in the last 72 hours. Sepsis Labs: No results for input(s): "PROCALCITON", "LATICACIDVEN" in the last 168 hours.  No results found for this or any previous visit (from the past 240  hours).       Radiology Studies: No results found.         LOS: 67 days   Time spent= 35 mins    Maggie Schooner, MD [facility]  If 7PM-7AM, please contact night-coverage  05/10/2024, 10:58 AM

## 2024-05-10 NOTE — Progress Notes (Signed)
 BP 183/102 this morning.  No complaints. MD notified.  Instructed to give 1000 scheduled meds now. Pt reluctantly took them.   Continues to refuse lasix  and aldactone .

## 2024-05-10 NOTE — Progress Notes (Addendum)
 Physical Therapy Treatment Patient Details Name: [name] MRN: [id number] DOB: [birth year] Today's Date: 05/10/2024   History of Present Illness Patient is 58 y.o. male presented to ED on 03/04/24 with stroke like symptoms when caregiver found him with slurred speech and right sided weakness. Head CT reveals Acute left MCA territory infarct affecting the insula, caudate nucleus, lentiform nucleus and internal capsule. PMH significant of  Schizoaffective disorder, heart failure with reduced ejection fraction, pretension, DMII, COPD, s/p bilateral amputations, chronic pain, and polysubstance abuse.    PT Comments  Continuing work on functional mobility and activity tolerance;  [name] was motivated to get out of his room, and performed anterior posterior transfer to the wheelchair (this PT brought a more standard width wheelchair) with relative ease; He performed wheelchair propulsion at or above 50% of the total distance we rolled; as we went, attempted to elicit more specific functional goals to work on with [name]; He seems to only want to roll out of his room (which of course has undeniable therapeutic value),  only do exercises specifically for R UE and hand, or work on speech; When this PT proposes a closed chain bil UE exercise aimed at a component of transfers, or when I ask him to perform a wheelchair level transfer in a new or different way, [name] reminds this PT that he had to do transfers at home -- he asks me why he needs to work on something he can already do -- will have to gently ease into the reality that transfers are different now, and that it's worth practicing transfers in many different ways;   Noteworthy that for the past 4 weeks, [name] has not refused PT   If plan is discharge home, recommend the following: Supervision due to cognitive status;Direct supervision/assist for financial management;Direct supervision/assist for medications management   Can travel by private vehicle         Equipment Recommendations  None recommended by PT    Recommendations for Other Services OT consult;Speech consult     Precautions / Restrictions Precautions Precautions: Fall Recall of Precautions/Restrictions: Impaired Precaution/Restrictions Comments: h/o BLE amputations     Mobility  Bed Mobility Overal bed mobility: Modified Independent                  Transfers Overall transfer level: Needs assistance Equipment used: None Transfers: Bed to chair/wheelchair/BSC             General transfer comment: Supervision for safety; backed into a standard width wheelchair, with good height during a wheelchair pushup-type move while backing in    Ambulation/Gait                   Dance movement psychotherapist Wheelchair mobility: Yes Wheelchair propulsion: Both upper extremities Wheelchair parts: Supervision/cueing Distance: 600 (rolled to and from 5N; took a few rest breaks, with this PT pushing) Wheelchair Assistance Details (indicate cue type and reason): Standard width wheelchair, so slightly, but notably,  more efficient propulsion; occasionally grazing doorframe, or objects like vitals machine, on the R side; rolled backwards as well   Tilt Bed    Modified Rankin (Stroke Patients Only) Modified Rankin (Stroke Patients Only) Pre-Morbid Rankin Score: Slight disability Modified Rankin: Moderate disability     Balance     Sitting balance-Leahy Scale: Good  Communication Communication Communication: Impaired  Cognition Arousal: Alert Behavior During Therapy: WFL for tasks assessed/performed                           PT - Cognition Comments: Participating in problem-solving; see also ST notes Following commands: Intact Following commands impaired: Follows one step commands inconsistently, Follows one step commands with increased time  (Responds better when commands are presented more like invitations)    Cueing Cueing Techniques: Verbal cues, Tactile cues, Other (comments)  Exercises Other Exercises Other Exercises: Seated, R UE pushups x10; trunk with anterior lean    General Comments General comments (skin integrity, edema, etc.): [name] engaged in conversation with staff that he was familiar with going to and coming from 5N; his room was needing floor cleaning while he was out      Pertinent Vitals/Pain Pain Assessment Pain Assessment: Faces Faces Pain Scale: No hurt Pain Intervention(s): Monitored during session    Home Living                          Prior Function            PT Goals (current goals can now be found in the care plan section) Acute Rehab PT Goals Patient Stated Goal: would like to be able to get home PT Goal Formulation: With patient Time For Goal Achievement: 05/16/24 Potential to Achieve Goals: Fair Progress towards PT goals: Progressing toward goals    Frequency    Min 2X/week      PT Plan      Co-evaluation              AM-PAC PT "6 Clicks" Mobility   Outcome Measure  Help needed turning from your back to your side while in a flat bed without using bedrails?: None Help needed moving from lying on your back to sitting on the side of a flat bed without using bedrails?: None Help needed moving to and from a bed to a chair (including a wheelchair)?: A Little Help needed standing up from a chair using your arms (e.g., wheelchair or bedside chair)?: Total Help needed to walk in hospital room?: Total Help needed climbing 3-5 steps with a railing? : Total 6 Click Score: 14    End of Session   Activity Tolerance: Patient tolerated treatment well Patient left: Other (comment) (Rolling with Mobility Specialist, who will help him back to his room) Nurse Communication: Mobility status PT Visit Diagnosis: Other abnormalities of gait and mobility (R26.89);Muscle  weakness (generalized) (M62.81);Other symptoms and signs involving the nervous system (R29.898);Hemiplegia and hemiparesis Hemiplegia - Right/Left: Right Hemiplegia - dominant/non-dominant: Dominant Hemiplegia - caused by: Cerebral infarction     Time: 1610-9604 PT Time Calculation (min) (ACUTE ONLY): 41 min  Charges:    $Therapeutic Activity: 8-22 mins $Wheel Chair Management: 8-22 mins PT General Charges $$ ACUTE PT VISIT: 1 Visit                     Darcus Eastern, PT  Acute Rehabilitation Services Office [phone] Secure Chat welcomed    Marcial Setting 05/10/2024, 2:53 PM

## 2024-05-11 DIAGNOSIS — I639 Cerebral infarction, unspecified: Secondary | ICD-10-CM | POA: Diagnosis not present

## 2024-05-11 NOTE — TOC Progression Note (Signed)
 Transition of Care [facility]) - Progression Note    Patient Details  Name: [name] MRN: [id number] Date of Birth: [birth year]  Transition of Care [facility]) CM/SW Contact  Tandy Fam, [name] Phone Number: 05/11/2024, 3:36 PM  Clinical Narrative:   CSW met with [facility] Director to discuss patient's case and possible LOG placement. Patient not a candidate at this time. Patient continues to await a Horticulturist, commercial. CSW to follow.    Expected Discharge Plan: Skilled Nursing Facility Barriers to Discharge: English as a second language teacher, Active Substance Use - Placement, SNF Pending bed offer  Expected Discharge Plan and Services       Living arrangements for the past 2 months: Apartment Expected Discharge Date: 03/14/24                                     Social Determinants of Health (SDOH) Interventions SDOH Screenings   Food Insecurity: Food Insecurity Present (03/06/2024)  Housing: High Risk (03/06/2024)  Transportation Needs: No Transportation Needs (03/06/2024)  Utilities: Patient Declined (03/06/2024)  Alcohol Screen: Medium Risk (06/24/2019)  Depression (PHQ2-9): Low Risk  (02/18/2024)  Recent Concern: Depression (PHQ2-9) - Medium Risk (01/21/2024)  Financial Resource Strain: High Risk (07/24/2021)   Received from [facility], [facility]  Physical Activity: Inactive (02/18/2024)  Social Connections: Unknown (04/05/2022)   Received from [facility], [facility]  Stress: Stress Concern Present (07/24/2021)   Received from [facility], [facility]  Tobacco Use: High Risk (03/04/2024)  Health Literacy: Adequate Health Literacy (02/18/2024)    Readmission Risk Interventions    12/21/2023    9:25 AM 12/09/2022    9:02 AM 12/01/2022    3:56 PM  Readmission Risk Prevention Plan  Transportation Screening Complete Complete Complete  HRI or Home Care Consult Complete    Social Work Consult for Recovery Care Planning/Counseling Complete    Palliative Care Screening Not  Applicable    Medication Review Oceanographer) Complete Complete Complete  PCP or Specialist appointment within 3-5 days of discharge  Complete Complete  HRI or Home Care Consult  Complete Complete  SW Recovery Care/Counseling Consult  Complete Complete  Palliative Care Screening  Not Applicable Not Applicable  Skilled Nursing Facility  Not Applicable Not Applicable  SNF Comments   n/a

## 2024-05-11 NOTE — Progress Notes (Signed)
 PROGRESS NOTE    [name]  ZOX:[id number] DOB: 27-Apr-1966 DOA: 03/04/2024 PCP: Abraham Abo, MD   Brief Narrative: 58 year old with history significant for COPD, cocaine abuse, presented to the ED 1//2025 for slower speech, confusion and right-sided weakness, CT head showed left MCA infarct, CTA head and neck revealed an M2 occlusion on the left,   Echocardiogram revealed large mobile left ventricular thrombus on the left and right ventricular apical thrombus in the right, ejection fraction less than 20%.  Cardiology was consulted recommended DOAC.  PT evaluated patient and recommended skilled nursing facility placement.  Patient was found to lack medical decision capacity/status and not amenable to determining his discharge disposition or  leave AGAINST MEDICAL ADVICE.  Time if he is noncompliant with his medication refusing physical examination, only time he refused oral medication.  TOC is pursuing guardianship with APS as he is medical decision capacity. Psychiatry presently recommending to await APS decision for guardianship before discharging home.  Patient does not qualify for forced medication administration  Assessment & Plan:   Principal Problem:   Acute CVA (cerebrovascular accident) (HCC) Active Problems:   LV (left ventricular) mural thrombus   Chronic systolic CHF (congestive heart failure) (HCC)   Prolonged QT interval   Elevated liver enzymes   COPD (chronic obstructive pulmonary disease) (HCC)   Cocaine abuse (HCC)   Schizoaffective disorder, bipolar type (HCC)   Benign essential HTN   Acid reflux   History of DVT (deep vein thrombosis)   Type 2 diabetes mellitus (HCC)   Status post bilateral BKA.   Dilated cardiomyopathy (HCC)  Acute CVA (cerebrovascular accident) (HCC) -He underwent extensive workup and imaging revealed left MCA territory infarct, caudate nucleus, lentiform nucleus and internal capsule MRI confirmed. -TEE was noted for large mobile left lower  clot just under the mitral valve with an EF of 20% and a small right apical clot with reduced RV systolic function. -He was started on Eliquis  for intracardiac thrombus, however he remained noncompliant.  In order to improve compliance, medicine changed to Xarelto  for once daily dosing.  -Psychiatry has deemed the patient to lack capacity and resist compliant with care.   -There is no safe way to force medication on him but he refuses to take it as a result nursing staff and physician simply make multiple attempts throughout the day to convince the patient of the need to comply with his medical therapy. -Does not qualify for forced medication.   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 Last 2D echo showed an EF of less than 20%. -Medication adjusted for better adherence.  Eliquis  changed to Xarelto , Coreg  long-acting.   Schizoaffective disorder bipolar disorder lacks medical capacity: Psych was consulted and recommended that the patient lacks medical capacity to make decisions if the patient attempts to leave AGAINST MEDICAL ADVICE he should be involuntarily committed. Psych recommending APS guardianship, TOC in touch with APS regarding this as patient lacks medical capacity   Essential hypertension:  Pressures are labile.  Currently on Coreg , Lasix , losartan , Aldactone .  Intermittent noncompliance   Hyperlipidemia: Continue Crestor .   Diabetes mellitus type 2:  last A1c of 5.4. refuses monitoring    Polysubstance abuse: UDS was positive for cocaine and benzos.   Deconditioning: Ambulatory status complicated by previous right AKA and left BKA has no insurance coverage for SNF. - refusing therapy at times   History of COPD: Continue inhalers.   History of DVT: On Xarelto  but  frequently refuses    Estimated body mass index is 20.09 kg/m as calculated from the following:   Height as of  this encounter: 5' 9 (1.753 m).   Weight as of this encounter: 61.7 kg.   DVT prophylaxis: Xarelto  Code Status: Full code Family Communication: no family at bedside Disposition Plan:  Status is: Inpatient Remains inpatient appropriate because: Awaiting for safe discharge plan.    Consultants:  Psych neurology    Antimicrobials:    Subjective: He is alert, watching TV   Objective: Vitals:   05/10/24 1603 05/10/24 2017 05/10/24 2347 05/11/24 0441  BP: (!) 138/118 (!) 155/89 (!) 141/93 (!) 146/103  Pulse: 80 71 84 68  Resp: 17 18 18 18   Temp:      TempSrc:      SpO2: 94% 98% 97% 99%  Weight:      Height:       No intake or output data in the 24 hours ending 05/11/24 1327 Filed Weights   03/26/24 0431 03/28/24 0456 04/04/24 0500  Weight: 42.7 kg 47.5 kg 61.7 kg    Examination:  General exam: Appears calm and comfortable  Respiratory system:  Respiratory effort normal. Central nervous system: Alert   Data Reviewed: I have personally reviewed following labs and imaging studies  CBC: No results for input(s): WBC, NEUTROABS, HGB, HCT, MCV, PLT in the last 168 hours. Basic Metabolic Panel: No results for input(s): NA, K, CL, CO2, GLUCOSE, BUN, CREATININE, CALCIUM , MG, PHOS in the last 168 hours. GFR: CrCl cannot be calculated (Patient's most recent lab result is older than the maximum 21 days allowed.). Liver Function Tests: No results for input(s): AST, ALT, ALKPHOS, BILITOT, PROT, ALBUMIN in the last 168 hours. No results for input(s): LIPASE, AMYLASE in the last 168 hours. No results for input(s): AMMONIA in the last 168 hours. Coagulation Profile: No results for input(s): INR, PROTIME in the last 168 hours. Cardiac Enzymes: No results for input(s): CKTOTAL, CKMB, CKMBINDEX, TROPONINI in the last 168 hours. BNP (last 3 results) No results for input(s): PROBNP in the last 8760 hours. HbA1C: No  results for input(s): HGBA1C in the last 72 hours. CBG: No results for input(s): GLUCAP in the last 168 hours. Lipid Profile: No results for input(s): CHOL, HDL, LDLCALC, TRIG, CHOLHDL, LDLDIRECT in the last 72 hours. Thyroid  Function Tests: No results for input(s): TSH, T4TOTAL, FREET4, T3FREE, THYROIDAB in the last 72 hours. Anemia Panel: No results for input(s): VITAMINB12, FOLATE, FERRITIN, TIBC, IRON, RETICCTPCT in the last 72 hours. Sepsis Labs: No results for input(s): PROCALCITON, LATICACIDVEN in the last 168 hours.  No results found for this or any previous visit (from the past 240 hours).       Radiology Studies: No results found.      Scheduled Meds:  carvedilol   10 mg Oral Daily   furosemide   40 mg Oral Daily   gabapentin   200 mg Oral BID   losartan   100 mg Oral Daily   revefenacin   175 mcg Nebulization Daily   rivaroxaban   20 mg Oral Q supper   rosuvastatin   5 mg Oral QHS   spironolactone   25 mg Oral Daily   Continuous Infusions:   LOS: 68 days    Time spent: 35 minutes    Nasser Ku A Fillmore Bynum, MD [facility]   If 7PM-7AM, please contact night-coverage [email or web address]  05/11/2024, 1:27 PM

## 2024-05-11 NOTE — Progress Notes (Signed)
 Occupational Therapy Treatment Patient Details Name: [name] MRN: [id number] DOB: 1966/06/01 Today's Date: 05/11/2024   History of present illness Patient is 58 y.o. male presented to ED on 03/04/24 with stroke like symptoms when caregiver found him with slurred speech and right sided weakness. Head CT reveals Acute left MCA territory infarct affecting the insula, caudate nucleus, lentiform nucleus and internal capsule. PMH significant of  Schizoaffective disorder, heart failure with reduced ejection fraction, pretension, DMII, COPD, s/p bilateral amputations, chronic pain, and polysubstance abuse.   OT comments  Pt agreeable to today's therapy session, focused it on RUE coordination deficits. Guided pt through a range of GMC/FMC activities, and performed them repetitively. Educated pt on his post CVA deficits and discussed how important repetitive tasks can be with restoring brain function/improving deficits. Educated pt on [facility] activities that he can perform while in room without therapy. He was agreeable to another therapy session, OT to continue to progress pt as able. Patient will benefit from continued inpatient follow up therapy, <3 hours/day        If plan is discharge home, recommend the following:  A little help with walking and/or transfers;A little help with bathing/dressing/bathroom;Supervision due to cognitive status;Direct supervision/assist for financial management;Direct supervision/assist for medications management   Equipment Recommendations  None recommended by OT    Recommendations for Other Services      Precautions / Restrictions Precautions Precautions: Fall Recall of Precautions/Restrictions: Impaired Precaution/Restrictions Comments: h/o BLE amputations Restrictions Weight Bearing Restrictions Per Provider Order: No       Mobility Bed Mobility Overal bed mobility: Modified Independent             General bed mobility comments: moving about freely in  bed to EOB    Transfers                         Balance Overall balance assessment: Needs assistance Sitting-balance support: Bilateral upper extremity supported Sitting balance-Leahy Scale: Good                                     ADL either performed or assessed with clinical judgement   ADL                                         General ADL Comments: Focused session on RUE coordination.See exercises session. Educatd pt on post CVA deficits and explained to him the importance of repetitive therapy and more buy-in from him to perform some of these activities with resoureces present in his room.    Extremity/Trunk Assessment Upper Extremity Assessment Upper Extremity Assessment: RUE deficits/detail;Right hand dominant RUE Deficits / Details: ROM and grip strength WFL, impaired FTN, decreased proprioception. Difficulty manipulating smaller or awkward shaped objects (clothes pins) RUE Coordination: decreased fine motor;decreased gross motor            Vision       Perception     Praxis     Communication Communication Communication: Impaired Factors Affecting Communication: Difficulty expressing self;Reduced clarity of speech   Cognition Arousal: Alert Behavior During Therapy: WFL for tasks assessed/performed Cognition: Cognition impaired Difficult to assess due to: Impaired communication   Awareness: Online awareness impaired, Intellectual awareness intact Memory impairment (select all impairments): Working memory Attention impairment (select first level of impairment):  Selective attention Executive functioning impairment (select all impairments): Reasoning, Problem solving OT - Cognition Comments: overstimulated when an extra tray of food was brought to his room. Increased time needed for pt to problem solve and reorganize setup to his liking.                 Following commands: Intact Following commands impaired:  Follows one step commands inconsistently, Follows one step commands with increased time      Cueing   Cueing Techniques: Verbal cues, Tactile cues, Other (comments)  Exercises Other Exercises Other Exercises: Placement and pickup of x3 small objects, utilizing in-hand manipulation of RUE Other Exercises: Placement and retreival of clothes pins from a vertical surface RUE; Once with pt reaching RUE up to R shoulder for retrieval of pins Other Exercises: Spearing with RUE + stick across multiple planes, working on control.    Shoulder Instructions       General Comments      Pertinent Vitals/ Pain       Pain Assessment Pain Assessment: No/denies pain  Home Living                                          Prior Functioning/Environment              Frequency  Min 1X/week        Progress Toward Goals  OT Goals(current goals can now be found in the care plan section)  Progress towards OT goals: Progressing toward goals  Acute Rehab OT Goals Patient Stated Goal: To keep working on R arm OT Goal Formulation: With patient Time For Goal Achievement: 05/25/24 Potential to Achieve Goals: Fair ADL Goals Pt/caregiver will Perform Home Exercise Program: Right Upper extremity;Independently;With written HEP provided (to increase coordination with use of RUE) Additional ADL Goal #2: Pt will utilize RUE to gather 5/5 objects without overshooting.  Plan      Co-evaluation                 AM-PAC OT 6 Clicks Daily Activity     Outcome Measure   Help from another person eating meals?: None Help from another person taking care of personal grooming?: A Little Help from another person toileting, which includes using toliet, bedpan, or urinal?: A Lot Help from another person bathing (including washing, rinsing, drying)?: A Little Help from another person to put on and taking off regular upper body clothing?: A Little Help from another person to put on and  taking off regular lower body clothing?: A Little 6 Click Score: 18    End of Session    OT Visit Diagnosis: Unsteadiness on feet (R26.81);Other abnormalities of gait and mobility (R26.89);Muscle weakness (generalized) (M62.81);Other symptoms and signs involving the nervous system (R29.898);Other symptoms and signs involving cognitive function;Hemiplegia and hemiparesis Hemiplegia - Right/Left: Right Hemiplegia - dominant/non-dominant: Dominant Hemiplegia - caused by: Cerebral infarction   Activity Tolerance Patient tolerated treatment well   Patient Left in bed;with call bell/phone within reach;with bed alarm set   Nurse Communication Mobility status        Time: 1610-9604 OT Time Calculation (min): 27 min  Charges: OT General Charges $OT Visit: 1 Visit OT Treatments $Therapeutic Activity: 23-37 mins  05/11/2024  AB, OTR/L  Acute Rehabilitation Services  Office: [phone]   [name] 05/11/2024, 5:28 PM

## 2024-05-12 DIAGNOSIS — I639 Cerebral infarction, unspecified: Secondary | ICD-10-CM | POA: Diagnosis not present

## 2024-05-12 NOTE — Plan of Care (Signed)
  Problem: Coping: Goal: Level of anxiety will decrease Outcome: Not Progressing   

## 2024-05-12 NOTE — Progress Notes (Signed)
 SLP Cancellation Note  Patient Details Name: [name] MRN: [id number] DOB: [birth year]   Cancelled treatment:       Reason Eval/Treat Not Completed: Patient declined. Attempted to see pt but he reports that he would prefer that I come after he has had lunch. When asked about what time that may be, he didn't have a clear answer, but just asked that I come later in the afternoon. I notified him that I do have some meetings this afternoon, but that I would try to come back if able.     Beth Brooke., M.A. CCC-SLP Acute Rehabilitation Services Office: [phone]  Secure chat preferred  05/12/2024, 11:44 AM

## 2024-05-12 NOTE — Progress Notes (Signed)
 Chaplain followed up with pt. Pt reports struggling with feeling unsupported by those who have been appointed to care for/represent him. Chaplain created space for pt to voice these feelings, providing compassionate presence, reflective listening, prayer and a sub from [place] [employer]. Chaplain will continue to follow up.

## 2024-05-12 NOTE — Progress Notes (Signed)
 PROGRESS NOTE    [name]  YNW:[id number] DOB: [birth year] DOA: 03/04/2024 PCP: Abraham Abo, MD   Brief Narrative: 58 year old with history significant for COPD, cocaine abuse, presented to the ED 1//2025 for slower speech, confusion and right-sided weakness, CT head showed left MCA infarct, CTA head and neck revealed an M2 occlusion on the left,   Echocardiogram revealed large mobile left ventricular thrombus on the left and right ventricular apical thrombus in the right, ejection fraction less than 20%.  Cardiology was consulted recommended DOAC.  PT evaluated patient and recommended skilled nursing facility placement.  Patient was found to lack medical decision capacity/status and not amenable to determining his discharge disposition or  leave AGAINST MEDICAL ADVICE.  Time if he is noncompliant with his medication refusing physical examination, only time he refused oral medication.  TOC is pursuing guardianship with APS as he is medical decision capacity. Psychiatry presently recommending to await APS decision for guardianship before discharging home.  Patient does not qualify for forced medication administration  Assessment & Plan:   Principal Problem:   Acute CVA (cerebrovascular accident) (HCC) Active Problems:   LV (left ventricular) mural thrombus   Chronic systolic CHF (congestive heart failure) (HCC)   Prolonged QT interval   Elevated liver enzymes   COPD (chronic obstructive pulmonary disease) (HCC)   Cocaine abuse (HCC)   Schizoaffective disorder, bipolar type (HCC)   Benign essential HTN   Acid reflux   History of DVT (deep vein thrombosis)   Type 2 diabetes mellitus (HCC)   Status post bilateral BKA.   Dilated cardiomyopathy (HCC)  Acute CVA (cerebrovascular accident) (HCC) -He underwent extensive workup and imaging revealed left MCA territory infarct, caudate nucleus, lentiform nucleus and internal capsule MRI confirmed. -TEE was noted for large mobile left lower  clot just under the mitral valve with an EF of 20% and a small right apical clot with reduced RV systolic function. -He was started on Eliquis  for intracardiac thrombus, however he remained noncompliant.  In order to improve compliance, medicine changed to Xarelto  for once daily dosing.  -Psychiatry has deemed the patient to lack capacity and resist compliant with care.   -There is no safe way to force medication on him but he refuses to take it as a result nursing staff and physician simply make multiple attempts throughout the day to convince the patient of the need to comply with his medical therapy. -Does not qualify for forced medication.   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 Last 2D echo showed an EF of less than 20%. -Medication adjusted for better adherence.  Eliquis  changed to Xarelto , Coreg  long-acting.   Schizoaffective disorder bipolar disorder lacks medical capacity: Psych was consulted and recommended that the patient lacks medical capacity to make decisions if the patient attempts to leave AGAINST MEDICAL ADVICE he should be involuntarily committed. Psych recommending APS guardianship, TOC in touch with APS regarding this as patient lacks medical capacity   Essential hypertension:  Pressures are labile.  Currently on Coreg , Lasix , losartan , Aldactone .  Intermittent noncompliance   Hyperlipidemia: Continue Crestor .   Diabetes mellitus type 2:  last A1c of 5.4. refuses monitoring    Polysubstance abuse: UDS was positive for cocaine and benzos.   Deconditioning: Ambulatory status complicated by previous right AKA and left BKA has no insurance coverage for SNF. - refusing therapy at times   History of COPD: Continue inhalers.   History of DVT: On Xarelto  but  frequently refuses    No changes in medical condition  Estimated body mass index is 20.09 kg/m as calculated from  the following:   Height as of this encounter: 5' 9 (1.753 m).   Weight as of this encounter: 61.7 kg.   DVT prophylaxis: Xarelto  Code Status: Full code Family Communication: no family at bedside Disposition Plan:  Status is: Inpatient Remains inpatient appropriate because: Awaiting for safe discharge plan.    Consultants:  Psych neurology    Antimicrobials:    Subjective: He is alert, watching TV   Objective: Vitals:   05/11/24 1941 05/12/24 0419 05/12/24 0745 05/12/24 0811  BP: (!) 155/102 (!) 154/119 (!) 144/107   Pulse: 66 77 72   Resp: 18 18 17    Temp:  97.8 F (36.6 C)    TempSrc:      SpO2: 99% 100% 99% 99%  Weight:      Height:        Intake/Output Summary (Last 24 hours) at 05/12/2024 1443 Last data filed at 05/12/2024 0850 Gross per 24 hour  Intake 240 ml  Output 300 ml  Net -60 ml   Filed Weights   03/26/24 0431 03/28/24 0456 04/04/24 0500  Weight: 42.7 kg 47.5 kg 61.7 kg    Examination:  General exam: NAD Respiratory system:  Normal Respiratory effort Central nervous system: Alert  Data Reviewed: I have personally reviewed following labs and imaging studies  CBC: No results for input(s): WBC, NEUTROABS, HGB, HCT, MCV, PLT in the last 168 hours. Basic Metabolic Panel: No results for input(s): NA, K, CL, CO2, GLUCOSE, BUN, CREATININE, CALCIUM , MG, PHOS in the last 168 hours. GFR: CrCl cannot be calculated (Patient's most recent lab result is older than the maximum 21 days allowed.). Liver Function Tests: No results for input(s): AST, ALT, ALKPHOS, BILITOT, PROT, ALBUMIN in the last 168 hours. No results for input(s): LIPASE, AMYLASE in the last 168 hours. No results for input(s): AMMONIA in the last 168 hours. Coagulation Profile: No results for input(s): INR, PROTIME in the last 168 hours. Cardiac Enzymes: No results for input(s): CKTOTAL, CKMB, CKMBINDEX, TROPONINI in the  last 168 hours. BNP (last 3 results) No results for input(s): PROBNP in the last 8760 hours. HbA1C: No results for input(s): HGBA1C in the last 72 hours. CBG: No results for input(s): GLUCAP in the last 168 hours. Lipid Profile: No results for input(s): CHOL, HDL, LDLCALC, TRIG, CHOLHDL, LDLDIRECT in the last 72 hours. Thyroid  Function Tests: No results for input(s): TSH, T4TOTAL, FREET4, T3FREE, THYROIDAB in the last 72 hours. Anemia Panel: No results for input(s): VITAMINB12, FOLATE, FERRITIN, TIBC, IRON, RETICCTPCT in the last 72 hours. Sepsis Labs: No results for input(s): PROCALCITON, LATICACIDVEN in the last 168 hours.  No results found for this or any previous visit (from the past 240 hours).       Radiology Studies: No results found.      Scheduled Meds:  carvedilol   10 mg Oral Daily   furosemide   40 mg Oral Daily   gabapentin   200 mg Oral BID   losartan   100 mg Oral Daily   revefenacin   175 mcg Nebulization Daily   rivaroxaban   20 mg Oral Q supper   rosuvastatin   5 mg Oral QHS   spironolactone   25 mg Oral Daily   Continuous Infusions:   LOS: 69 days    Time spent: 35 minutes    Sherry Blackard A Jasher Barkan, MD [facility]   If 7PM-7AM, please contact night-coverage [email or web address]  05/12/2024, 2:43 PM

## 2024-05-13 DIAGNOSIS — I639 Cerebral infarction, unspecified: Secondary | ICD-10-CM | POA: Diagnosis not present

## 2024-05-13 NOTE — Progress Notes (Signed)
 PROGRESS NOTE    [name]  ZOX:[id number] DOB: 20-Jan-1966 DOA: 03/04/2024 PCP: Abraham Abo, MD   Brief Narrative: 58 year old with history significant for COPD, cocaine abuse, presented to the ED 1//2025 for slower speech, confusion and right-sided weakness, CT head showed left MCA infarct, CTA head and neck revealed an M2 occlusion on the left,   Echocardiogram revealed large mobile left ventricular thrombus on the left and right ventricular apical thrombus in the right, ejection fraction less than 20%.  Cardiology was consulted recommended DOAC.  PT evaluated patient and recommended skilled nursing facility placement.  Patient was found to lack medical decision capacity/status and not amenable to determining his discharge disposition or  leave AGAINST MEDICAL ADVICE.  Time if he is noncompliant with his medication refusing physical examination, only time he refused oral medication.  TOC is pursuing guardianship with APS as he is medical decision capacity. Psychiatry presently recommending to await APS decision for guardianship before discharging home.  Patient does not qualify for forced medication administration  Assessment & Plan:   Principal Problem:   Acute CVA (cerebrovascular accident) (HCC) Active Problems:   LV (left ventricular) mural thrombus   Chronic systolic CHF (congestive heart failure) (HCC)   Prolonged QT interval   Elevated liver enzymes   COPD (chronic obstructive pulmonary disease) (HCC)   Cocaine abuse (HCC)   Schizoaffective disorder, bipolar type (HCC)   Benign essential HTN   Acid reflux   History of DVT (deep vein thrombosis)   Type 2 diabetes mellitus (HCC)   Status post bilateral BKA.   Dilated cardiomyopathy (HCC)  Acute CVA (cerebrovascular accident) (HCC) -He underwent extensive workup and imaging revealed left MCA territory infarct, caudate nucleus, lentiform nucleus and internal capsule MRI confirmed. -TEE was noted for large mobile left lower  clot just under the mitral valve with an EF of 20% and a small right apical clot with reduced RV systolic function. -He was started on Eliquis  for intracardiac thrombus, however he remained noncompliant.  In order to improve compliance, medicine changed to Xarelto  for once daily dosing.  -Psychiatry has deemed the patient to lack capacity and resist compliant with care.   -There is no safe way to force medication on him but he refuses to take it as a result nursing staff and physician simply make multiple attempts throughout the day to convince the patient of the need to comply with his medical therapy. -Does not qualify for forced medication.   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 Last 2D echo showed an EF of less than 20%. -Medication adjusted for better adherence.  Eliquis  changed to Xarelto , Coreg  long-acting.   Schizoaffective disorder bipolar disorder lacks medical capacity: Psych was consulted and recommended that the patient lacks medical capacity to make decisions if the patient attempts to leave AGAINST MEDICAL ADVICE he should be involuntarily committed. Psych recommending APS guardianship, TOC in touch with APS regarding this as patient lacks medical capacity   Essential hypertension:  Pressures are labile.  Currently on Coreg , Lasix , losartan , Aldactone .  Intermittent noncompliance   Hyperlipidemia: Continue Crestor .   Diabetes mellitus type 2:  last A1c of 5.4. refuses monitoring    Polysubstance abuse: UDS was positive for cocaine and benzos.   Deconditioning: Ambulatory status complicated by previous right AKA and left BKA has no insurance coverage for SNF. - refusing therapy at times   History of COPD: Continue inhalers.   History of DVT: On Xarelto  but  frequently refuses    No changes in medical condition  Estimated body mass index is 20.09 kg/m as calculated from  the following:   Height as of this encounter: 5' 9 (1.753 m).   Weight as of this encounter: 61.7 kg.   DVT prophylaxis: Xarelto  Code Status: Full code Family Communication: no family at bedside Disposition Plan:  Status is: Inpatient Remains inpatient appropriate because: Awaiting for safe discharge plan.    Consultants:  Psych neurology    Antimicrobials:    Subjective: Refuse labs. No complaints   Objective: Vitals:   05/12/24 0811 05/12/24 1944 05/13/24 0017 05/13/24 1128  BP:  (!) 173/109 (!) 171/116 (!) 162/104  Pulse:  77 81 77  Resp:  18 18 18   Temp:  97.8 F (36.6 C) 97.7 F (36.5 C) 98.7 F (37.1 C)  TempSrc:    Oral  SpO2: 99% 99% 95% 96%  Weight:      Height:        Intake/Output Summary (Last 24 hours) at 05/13/2024 1220 Last data filed at 05/13/2024 1209 Gross per 24 hour  Intake 240 ml  Output 240 ml  Net 0 ml   Filed Weights   03/26/24 0431 03/28/24 0456 04/04/24 0500  Weight: 42.7 kg 47.5 kg 61.7 kg    Examination:  General exam: NAD Respiratory system:  Normal Resp effort Central nervous system: Alert  Data Reviewed: I have personally reviewed following labs and imaging studies  CBC: No results for input(s): WBC, NEUTROABS, HGB, HCT, MCV, PLT in the last 168 hours. Basic Metabolic Panel: No results for input(s): NA, K, CL, CO2, GLUCOSE, BUN, CREATININE, CALCIUM , MG, PHOS in the last 168 hours. GFR: CrCl cannot be calculated (Patient's most recent lab result is older than the maximum 21 days allowed.). Liver Function Tests: No results for input(s): AST, ALT, ALKPHOS, BILITOT, PROT, ALBUMIN in the last 168 hours. No results for input(s): LIPASE, AMYLASE in the last 168 hours. No results for input(s): AMMONIA in the last 168 hours. Coagulation Profile: No results for input(s): INR, PROTIME in the last 168 hours. Cardiac Enzymes: No results for input(s): CKTOTAL, CKMB,  CKMBINDEX, TROPONINI in the last 168 hours. BNP (last 3 results) No results for input(s): PROBNP in the last 8760 hours. HbA1C: No results for input(s): HGBA1C in the last 72 hours. CBG: No results for input(s): GLUCAP in the last 168 hours. Lipid Profile: No results for input(s): CHOL, HDL, LDLCALC, TRIG, CHOLHDL, LDLDIRECT in the last 72 hours. Thyroid  Function Tests: No results for input(s): TSH, T4TOTAL, FREET4, T3FREE, THYROIDAB in the last 72 hours. Anemia Panel: No results for input(s): VITAMINB12, FOLATE, FERRITIN, TIBC, IRON, RETICCTPCT in the last 72 hours. Sepsis Labs: No results for input(s): PROCALCITON, LATICACIDVEN in the last 168 hours.  No results found for this or any previous visit (from the past 240 hours).       Radiology Studies: No results found.      Scheduled Meds:  carvedilol   10 mg Oral Daily   furosemide   40 mg Oral Daily   gabapentin   200 mg Oral BID   losartan   100 mg Oral Daily   revefenacin   175 mcg Nebulization Daily   rivaroxaban   20 mg Oral Q supper   rosuvastatin   5 mg Oral QHS   spironolactone   25 mg Oral Daily   Continuous Infusions:   LOS: 70 days    Time spent: 35 minutes    Danette Duos, MD [facility]   If  7PM-7AM, please contact night-coverage [email or web address]  05/13/2024, 12:20 PM

## 2024-05-13 NOTE — Progress Notes (Signed)
 Pt. Refusing vitals and medications.

## 2024-05-13 NOTE — Progress Notes (Signed)
   05/13/24 1545  Spiritual Encounters  Type of Visit Follow up  Care provided to: Patient  Reason for visit Routine spiritual support  OnCall Visit No  Spiritual Framework  Presenting Themes Goals in life/care;Community and relationships  Interventions  Spiritual Care Interventions Made Reflective listening;Reconciliation with self/others;Narrative/life review

## 2024-05-13 NOTE — Progress Notes (Signed)
 Mobility Specialist Progress Note:    05/13/24 1200  Mobility  Activity Transferred from bed to chair;Transferred from chair to bed  Level of Assistance Standby assist, set-up cues, supervision of patient - no hands on  Assistive Device Wheelchair  Distance Ambulated (ft) 100 ft (Propelled in hallway)  Activity Response Tolerated well  Mobility Referral Yes  Mobility visit 1 Mobility  Mobility Specialist Start Time (ACUTE ONLY) 1025  Mobility Specialist Stop Time (ACUTE ONLY) 1123  Mobility Specialist Time Calculation (min) (ACUTE ONLY) 58 min   Pt received in bed, eager for mobility. Able to perform posterior scoot to wheelchair. No complaints throughout. Propelled in hallway. Pt then able to perform anterior scoot to bed w/ supervision. Pt left in bed with call bell and bed alarm on.  D'Vante Nolon Baxter Mobility Specialist Please contact via Special educational needs teacher or Rehab office at [phone]

## 2024-05-14 DIAGNOSIS — I639 Cerebral infarction, unspecified: Secondary | ICD-10-CM | POA: Diagnosis not present

## 2024-05-14 MED ORDER — ORAL CARE MOUTH RINSE
15.0000 mL | OROMUCOSAL | Status: DC | PRN
Start: 2024-05-14 — End: 2024-08-17

## 2024-05-14 NOTE — Progress Notes (Signed)
 PROGRESS NOTE    [name]  ZOX:[id number] DOB: [birth year] DOA: 03/04/2024 PCP: Abraham Abo, MD   Brief Narrative: 58 year old with history significant for COPD, cocaine abuse, presented to the ED 1//2025 for slower speech, confusion and right-sided weakness, CT head showed left MCA infarct, CTA head and neck revealed an M2 occlusion on the left,   Echocardiogram revealed large mobile left ventricular thrombus on the left and right ventricular apical thrombus in the right, ejection fraction less than 20%.  Cardiology was consulted recommended DOAC.  PT evaluated patient and recommended skilled nursing facility placement.  Patient was found to lack medical decision capacity/status and not amenable to determining his discharge disposition or  leave AGAINST MEDICAL ADVICE.  At time he is noncompliant with his medication refusing physical examination, oothers time he refused oral medication.  TOC is pursuing guardianship with APS as he is medical decision capacity. Psychiatry presently recommending to await APS decision for guardianship before discharging home.  Patient does not qualify for forced medication administration  Assessment & Plan:   Principal Problem:   Acute CVA (cerebrovascular accident) (HCC) Active Problems:   LV (left ventricular) mural thrombus   Chronic systolic CHF (congestive heart failure) (HCC)   Prolonged QT interval   Elevated liver enzymes   COPD (chronic obstructive pulmonary disease) (HCC)   Cocaine abuse (HCC)   Schizoaffective disorder, bipolar type (HCC)   Benign essential HTN   Acid reflux   History of DVT (deep vein thrombosis)   Type 2 diabetes mellitus (HCC)   Status post bilateral BKA.   Dilated cardiomyopathy (HCC)  Acute CVA (cerebrovascular accident) (HCC) -He underwent extensive workup and imaging revealed left MCA territory infarct, caudate nucleus, lentiform nucleus and internal capsule MRI confirmed. -TEE was noted for large mobile left  lower clot just under the mitral valve with an EF of 20% and a small right apical clot with reduced RV systolic function. -He was started on Eliquis  for intracardiac thrombus, however he remained noncompliant.  In order to improve compliance, medicine changed to Xarelto  for once daily dosing.  -Psychiatry has deemed the patient to lack capacity and resist compliant with care.   -There is no safe way to force medication on him but he refuses to take it as a result nursing staff and physician simply make multiple attempts throughout the day to convince the patient of the need to comply with his medical therapy. -Does not qualify for forced medication.   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 Last 2D echo showed an EF of less than 20%. -Medication adjusted for better adherence.  Eliquis  changed to Xarelto , Coreg  long-acting.   Schizoaffective disorder bipolar disorder lacks medical capacity: Psych was consulted and recommended that the patient lacks medical capacity to make decisions if the patient attempts to leave AGAINST MEDICAL ADVICE he should be involuntarily committed. Psych recommending APS guardianship, TOC in touch with APS regarding this as patient lacks medical capacity   Essential hypertension:  Pressures are labile.  Currently on Coreg , Lasix , losartan , Aldactone .  Intermittent noncompliance   Hyperlipidemia: Continue Crestor .   Diabetes mellitus type 2:  last A1c of 5.4. refuses monitoring    Polysubstance abuse: UDS was positive for cocaine and benzos.   Deconditioning: Ambulatory status complicated by previous right AKA and left BKA has no insurance coverage for SNF. - refusing therapy at times   History of COPD: Continue inhalers.   History of DVT: On Xarelto  but  frequently refuses    No changes in medical condition  Estimated body mass index is 20.09 kg/m as  calculated from the following:   Height as of this encounter: 5' 9 (1.753 m).   Weight as of this encounter: 61.7 kg.   DVT prophylaxis: Xarelto  Code Status: Full code Family Communication: no family at bedside Disposition Plan:  Status is: Inpatient Remains inpatient appropriate because: Awaiting for safe discharge plan.    Consultants:  Psych neurology    Antimicrobials:    Subjective: Decline labs. No complaints.   Objective: Vitals:   05/13/24 2009 05/14/24 0758 05/14/24 1232 05/14/24 1538  BP: (!) 145/93 (!) 156/112 (!) 131/99 113/88  Pulse: 69 72 74 72  Resp: 18 14 14 18   Temp: 98.4 F (36.9 C)     TempSrc:      SpO2: 100% 100% 97% 97%  Weight:      Height:        Intake/Output Summary (Last 24 hours) at 05/14/2024 1637 Last data filed at 05/14/2024 1200 Gross per 24 hour  Intake 480 ml  Output 200 ml  Net 280 ml   Filed Weights   03/26/24 0431 03/28/24 0456 04/04/24 0500  Weight: 42.7 kg 47.5 kg 61.7 kg    Examination:  General exam: NAD Respiratory system:  Normal resp effort Central nervous system: Alert Left BKA, Right AKA  Data Reviewed: I have personally reviewed following labs and imaging studies  CBC: No results for input(s): WBC, NEUTROABS, HGB, HCT, MCV, PLT in the last 168 hours. Basic Metabolic Panel: No results for input(s): NA, K, CL, CO2, GLUCOSE, BUN, CREATININE, CALCIUM , MG, PHOS in the last 168 hours. GFR: CrCl cannot be calculated (Patient's most recent lab result is older than the maximum 21 days allowed.). Liver Function Tests: No results for input(s): AST, ALT, ALKPHOS, BILITOT, PROT, ALBUMIN in the last 168 hours. No results for input(s): LIPASE, AMYLASE in the last 168 hours. No results for input(s): AMMONIA in the last 168 hours. Coagulation Profile: No results for input(s): INR, PROTIME in the last 168 hours. Cardiac Enzymes: No results for input(s): CKTOTAL,  CKMB, CKMBINDEX, TROPONINI in the last 168 hours. BNP (last 3 results) No results for input(s): PROBNP in the last 8760 hours. HbA1C: No results for input(s): HGBA1C in the last 72 hours. CBG: No results for input(s): GLUCAP in the last 168 hours. Lipid Profile: No results for input(s): CHOL, HDL, LDLCALC, TRIG, CHOLHDL, LDLDIRECT in the last 72 hours. Thyroid  Function Tests: No results for input(s): TSH, T4TOTAL, FREET4, T3FREE, THYROIDAB in the last 72 hours. Anemia Panel: No results for input(s): VITAMINB12, FOLATE, FERRITIN, TIBC, IRON, RETICCTPCT in the last 72 hours. Sepsis Labs: No results for input(s): PROCALCITON, LATICACIDVEN in the last 168 hours.  No results found for this or any previous visit (from the past 240 hours).       Radiology Studies: No results found.      Scheduled Meds:  carvedilol   10 mg Oral Daily   furosemide   40 mg Oral Daily   gabapentin   200 mg Oral BID   losartan   100 mg Oral Daily   revefenacin   175 mcg Nebulization Daily   rivaroxaban   20 mg Oral Q supper   rosuvastatin   5 mg Oral QHS   spironolactone   25 mg Oral Daily   Continuous Infusions:   LOS: 71 days    Time spent: 35 minutes    Shanard Treto A Tiesha Marich, MD [facility]   If 7PM-7AM, please  contact night-coverage [email or web address]  05/14/2024, 4:37 PM

## 2024-05-15 DIAGNOSIS — I639 Cerebral infarction, unspecified: Secondary | ICD-10-CM | POA: Diagnosis not present

## 2024-05-15 NOTE — Progress Notes (Signed)
 PROGRESS NOTE    [name]  ION:[id number] DOB: 05/01/1966 DOA: 03/04/2024 PCP: Abraham Abo, MD   Brief Narrative: 58 year old with history significant for COPD, cocaine abuse, presented to the ED 1//2025 for slower speech, confusion and right-sided weakness, CT head showed left MCA infarct, CTA head and neck revealed an M2 occlusion on the left,   Echocardiogram revealed large mobile left ventricular thrombus on the left and right ventricular apical thrombus in the right, ejection fraction less than 20%.  Cardiology was consulted recommended DOAC.  PT evaluated patient and recommended skilled nursing facility placement.  Patient was found to lack medical decision capacity/status and not amenable to determining his discharge disposition or  leave AGAINST MEDICAL ADVICE.  At time he is noncompliant with his medication refusing physical examination, oothers time he refused oral medication.  TOC is pursuing guardianship with APS as he is medical decision capacity. Psychiatry presently recommending to await APS decision for guardianship before discharging home.  Patient does not qualify for forced medication administration  Assessment & Plan:   Principal Problem:   Acute CVA (cerebrovascular accident) (HCC) Active Problems:   LV (left ventricular) mural thrombus   Chronic systolic CHF (congestive heart failure) (HCC)   Prolonged QT interval   Elevated liver enzymes   COPD (chronic obstructive pulmonary disease) (HCC)   Cocaine abuse (HCC)   Schizoaffective disorder, bipolar type (HCC)   Benign essential HTN   Acid reflux   History of DVT (deep vein thrombosis)   Type 2 diabetes mellitus (HCC)   Status post bilateral BKA.   Dilated cardiomyopathy (HCC)  Acute CVA (cerebrovascular accident) (HCC) -He underwent extensive workup and imaging revealed left MCA territory infarct, caudate nucleus, lentiform nucleus and internal capsule MRI confirmed. -TEE was noted for large mobile left  lower clot just under the mitral valve with an EF of 20% and a small right apical clot with reduced RV systolic function. -He was started on Eliquis  for intracardiac thrombus, however he remained noncompliant.  In order to improve compliance, medicine changed to Xarelto  for once daily dosing.  -Psychiatry has deemed the patient to lack capacity and resist compliant with care.   -There is no safe way to force medication on him but he refuses to take it as a result nursing staff and physician simply make multiple attempts throughout the day to convince the patient of the need to comply with his medical therapy. -Does not qualify for forced medication.   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 Last 2D echo showed an EF of less than 20%. -Medication adjusted for better adherence.  Eliquis  changed to Xarelto , Coreg  long-acting.   Schizoaffective disorder bipolar disorder lacks medical capacity: Psych was consulted and recommended that the patient lacks medical capacity to make decisions if the patient attempts to leave AGAINST MEDICAL ADVICE he should be involuntarily committed. Psych recommending APS guardianship, TOC in touch with APS regarding this as patient lacks medical capacity   Essential hypertension:  Pressures are labile.  Currently on Coreg , Lasix , losartan , Aldactone .  Intermittent noncompliance   Hyperlipidemia: Continue Crestor .   Diabetes mellitus type 2:  last A1c of 5.4. refuses monitoring    Polysubstance abuse: UDS was positive for cocaine and benzos.   Deconditioning: Ambulatory status complicated by previous right AKA and left BKA has no insurance coverage for SNF. - refusing therapy at times   History of COPD: Continue inhalers.   History of DVT: On Xarelto  but  frequently refuses   No changes on medical condition.  Continue to refuse labs, and meds at times  Estimated  body mass index is 20.09 kg/m as calculated from the following:   Height as of this encounter: 5' 9 (1.753 m).   Weight as of this encounter: 61.7 kg.   DVT prophylaxis: Xarelto  Code Status: Full code Family Communication: no family at bedside Disposition Plan:  Status is: Inpatient Remains inpatient appropriate because: Awaiting for safe discharge plan.    Consultants:  Psych neurology    Antimicrobials:    Subjective: Refuse Physical examination.  He is ok.   Objective: Vitals:   05/14/24 1937 05/14/24 2327 05/15/24 0403 05/15/24 0828  BP: (!) 127/90 (!) 130/108 (!) 147/95 (!) 142/108  Pulse: 68 73 63 76  Resp: 18 18 18 18   Temp:  97.8 F (36.6 C) 97.8 F (36.6 C)   TempSrc:      SpO2: 100% 99% 97% 97%  Weight:      Height:        Intake/Output Summary (Last 24 hours) at 05/15/2024 1117 Last data filed at 05/14/2024 1200 Gross per 24 hour  Intake 240 ml  Output --  Net 240 ml   Filed Weights   03/26/24 0431 03/28/24 0456 04/04/24 0500  Weight: 42.7 kg 47.5 kg 61.7 kg    Examination:  General exam: NAD Respiratory system: Normal resp effort Central nervous system: Alert Left BKA, Right AKA  Data Reviewed: I have personally reviewed following labs and imaging studies  CBC: No results for input(s): WBC, NEUTROABS, HGB, HCT, MCV, PLT in the last 168 hours. Basic Metabolic Panel: No results for input(s): NA, K, CL, CO2, GLUCOSE, BUN, CREATININE, CALCIUM , MG, PHOS in the last 168 hours. GFR: CrCl cannot be calculated (Patient's most recent lab result is older than the maximum 21 days allowed.). Liver Function Tests: No results for input(s): AST, ALT, ALKPHOS, BILITOT, PROT, ALBUMIN in the last 168 hours. No results for input(s): LIPASE, AMYLASE in the last 168 hours. No results for input(s): AMMONIA in the last 168 hours. Coagulation Profile: No results for input(s): INR, PROTIME in the last 168  hours. Cardiac Enzymes: No results for input(s): CKTOTAL, CKMB, CKMBINDEX, TROPONINI in the last 168 hours. BNP (last 3 results) No results for input(s): PROBNP in the last 8760 hours. HbA1C: No results for input(s): HGBA1C in the last 72 hours. CBG: No results for input(s): GLUCAP in the last 168 hours. Lipid Profile: No results for input(s): CHOL, HDL, LDLCALC, TRIG, CHOLHDL, LDLDIRECT in the last 72 hours. Thyroid  Function Tests: No results for input(s): TSH, T4TOTAL, FREET4, T3FREE, THYROIDAB in the last 72 hours. Anemia Panel: No results for input(s): VITAMINB12, FOLATE, FERRITIN, TIBC, IRON, RETICCTPCT in the last 72 hours. Sepsis Labs: No results for input(s): PROCALCITON, LATICACIDVEN in the last 168 hours.  No results found for this or any previous visit (from the past 240 hours).       Radiology Studies: No results found.      Scheduled Meds:  carvedilol   10 mg Oral Daily   furosemide   40 mg Oral Daily   gabapentin   200 mg Oral BID   losartan   100 mg Oral Daily   revefenacin   175 mcg Nebulization Daily   rivaroxaban   20 mg Oral Q supper   rosuvastatin   5 mg Oral QHS   spironolactone   25 mg Oral Daily   Continuous Infusions:   LOS: 72 days    Time spent: 35 minutes  Danette Duos, MD [facility]   If 7PM-7AM, please contact night-coverage [email or web address]  05/15/2024, 11:17 AM

## 2024-05-15 NOTE — Plan of Care (Signed)
  Problem: Nutrition: Goal: Adequate nutrition will be maintained Outcome: Adequate for Discharge   Problem: Coping: Goal: Level of anxiety will decrease Outcome: Adequate for Discharge   Problem: Elimination: Goal: Will not experience complications related to bowel motility Outcome: Adequate for Discharge Goal: Will not experience complications related to urinary retention Outcome: Adequate for Discharge   Problem: Pain Managment: Goal: General experience of comfort will improve and/or be controlled Outcome: Adequate for Discharge   Problem: Safety: Goal: Ability to remain free from injury will improve Outcome: Adequate for Discharge   Problem: Skin Integrity: Goal: Risk for impaired skin integrity will decrease Outcome: Adequate for Discharge

## 2024-05-16 DIAGNOSIS — I639 Cerebral infarction, unspecified: Secondary | ICD-10-CM | POA: Diagnosis not present

## 2024-05-16 NOTE — Progress Notes (Signed)
 PROGRESS NOTE    [name]  ZOX:[id number] DOB: 01/10/1966 DOA: 03/04/2024 PCP: Abraham Abo, MD   Brief Narrative: 58 year old with history significant for COPD, cocaine abuse, presented to the ED 1//2025 for slower speech, confusion and right-sided weakness, CT head showed left MCA infarct, CTA head and neck revealed an M2 occlusion on the left,   Echocardiogram revealed large mobile left ventricular thrombus on the left and right ventricular apical thrombus in the right, ejection fraction less than 20%.  Cardiology was consulted recommended DOAC.  PT evaluated patient and recommended skilled nursing facility placement.  Patient was found to lack medical decision capacity/status and not amenable to determining his discharge disposition or  leave AGAINST MEDICAL ADVICE.  At time he is noncompliant with his medication refusing physical examination, oothers time he refused oral medication.  TOC is pursuing guardianship with APS as he is medical decision capacity. Psychiatry presently recommending to await APS decision for guardianship before discharging home.  Patient does not qualify for forced medication administration No changes in medical condition, awaiting placement.    Assessment & Plan:   Principal Problem:   Acute CVA (cerebrovascular accident) (HCC) Active Problems:   LV (left ventricular) mural thrombus   Chronic systolic CHF (congestive heart failure) (HCC)   Prolonged QT interval   Elevated liver enzymes   COPD (chronic obstructive pulmonary disease) (HCC)   Cocaine abuse (HCC)   Schizoaffective disorder, bipolar type (HCC)   Benign essential HTN   Acid reflux   History of DVT (deep vein thrombosis)   Type 2 diabetes mellitus (HCC)   Status post bilateral BKA.   Dilated cardiomyopathy (HCC)  Acute CVA (cerebrovascular accident) (HCC) -He underwent extensive workup and imaging revealed left MCA territory infarct, caudate nucleus, lentiform nucleus and internal  capsule MRI confirmed. -TEE was noted for large mobile left lower clot just under the mitral valve with an EF of 20% and a small right apical clot with reduced RV systolic function. -He was started on Eliquis  for intracardiac thrombus, however he remained noncompliant.  In order to improve compliance, medicine changed to Xarelto  for once daily dosing.  -Psychiatry has deemed the patient to lack capacity and resist compliant with care.   -There is no safe way to force medication on him but he refuses to take it as a result nursing staff and physician simply make multiple attempts throughout the day to convince the patient of the need to comply with his medical therapy. -Does not qualify for forced medication.   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 Last 2D echo showed an EF of less than 20%. -Medication adjusted for better adherence.  Eliquis  changed to Xarelto , Coreg  long-acting.   Schizoaffective disorder bipolar disorder lacks medical capacity: Psych was consulted and recommended that the patient lacks medical capacity to make decisions if the patient attempts to leave AGAINST MEDICAL ADVICE he should be involuntarily committed. Psych recommending APS guardianship, TOC in touch with APS regarding this as patient lacks medical capacity   Essential hypertension:  Pressures are labile.  Currently on Coreg , Lasix , losartan , Aldactone .  Intermittent noncompliance   Hyperlipidemia: Continue Crestor .   Diabetes mellitus type 2:  last A1c of 5.4. refuses monitoring    Polysubstance abuse: UDS was positive for cocaine and benzos.   Deconditioning: Ambulatory status complicated by previous right AKA and left BKA has no insurance coverage for SNF. - refusing therapy at times   History of COPD: Continue  inhalers.   History of DVT: On Xarelto  but frequently refuses   No changes on medical  condition.  Continue to refuse labs, and meds at times  Estimated body mass index is 20.09 kg/m as calculated from the following:   Height as of this encounter: 5' 9 (1.753 m).   Weight as of this encounter: 61.7 kg.   DVT prophylaxis: Xarelto  Code Status: Full code Family Communication: no family at bedside Disposition Plan:  Status is: Inpatient Remains inpatient appropriate because: Awaiting for safe discharge plan.    Consultants:  Psych neurology    Antimicrobials:    Subjective: Alert, does not want to answer questions today   Objective: Vitals:   05/15/24 1548 05/15/24 1959 05/15/24 2332 05/16/24 1235  BP: (!) 155/101 (!) 140/98 (!) 151/100 (!) 152/94  Pulse: 65 66 75 75  Resp: 18 18 18 14   Temp:  97.8 F (36.6 C) 97.8 F (36.6 C)   TempSrc:      SpO2: 99% 100% 97% 100%  Weight:      Height:        Intake/Output Summary (Last 24 hours) at 05/16/2024 1402 Last data filed at 05/16/2024 1100 Gross per 24 hour  Intake --  Output 550 ml  Net -550 ml   Filed Weights   03/26/24 0431 03/28/24 0456 04/04/24 0500  Weight: 42.7 kg 47.5 kg 61.7 kg    Examination:  General exam: NAD Respiratory system: Normal Resp effort Central nervous system: alert Left BKA, Right AKA  Data Reviewed: I have personally reviewed following labs and imaging studies  CBC: No results for input(s): WBC, NEUTROABS, HGB, HCT, MCV, PLT in the last 168 hours. Basic Metabolic Panel: No results for input(s): NA, K, CL, CO2, GLUCOSE, BUN, CREATININE, CALCIUM , MG, PHOS in the last 168 hours. GFR: CrCl cannot be calculated (Patient's most recent lab result is older than the maximum 21 days allowed.). Liver Function Tests: No results for input(s): AST, ALT, ALKPHOS, BILITOT, PROT, ALBUMIN in the last 168 hours. No results for input(s): LIPASE, AMYLASE in the last 168 hours. No results for input(s): AMMONIA in the last 168  hours. Coagulation Profile: No results for input(s): INR, PROTIME in the last 168 hours. Cardiac Enzymes: No results for input(s): CKTOTAL, CKMB, CKMBINDEX, TROPONINI in the last 168 hours. BNP (last 3 results) No results for input(s): PROBNP in the last 8760 hours. HbA1C: No results for input(s): HGBA1C in the last 72 hours. CBG: No results for input(s): GLUCAP in the last 168 hours. Lipid Profile: No results for input(s): CHOL, HDL, LDLCALC, TRIG, CHOLHDL, LDLDIRECT in the last 72 hours. Thyroid  Function Tests: No results for input(s): TSH, T4TOTAL, FREET4, T3FREE, THYROIDAB in the last 72 hours. Anemia Panel: No results for input(s): VITAMINB12, FOLATE, FERRITIN, TIBC, IRON, RETICCTPCT in the last 72 hours. Sepsis Labs: No results for input(s): PROCALCITON, LATICACIDVEN in the last 168 hours.  No results found for this or any previous visit (from the past 240 hours).       Radiology Studies: No results found.      Scheduled Meds:  carvedilol   10 mg Oral Daily   furosemide   40 mg Oral Daily   gabapentin   200 mg Oral BID   losartan   100 mg Oral Daily   revefenacin   175 mcg Nebulization Daily   rivaroxaban   20 mg Oral Q supper   rosuvastatin   5 mg Oral QHS   spironolactone   25 mg Oral Daily   Continuous Infusions:   LOS:  73 days    Time spent: 35 minutes    Shuntavia Yerby A Landrum Pink, MD [facility]   If 7PM-7AM, please contact night-coverage [email or web address]  05/16/2024, 2:02 PM

## 2024-05-17 DIAGNOSIS — I639 Cerebral infarction, unspecified: Secondary | ICD-10-CM | POA: Diagnosis not present

## 2024-05-17 NOTE — Progress Notes (Signed)
 Physical Therapy Treatment Patient Details Name: [name] MRN: [id number] DOB: 06-14-1966 Today's Date: 05/17/2024   History of Present Illness Patient is 58 y.o. male presented to ED on 03/04/24 with stroke like symptoms when caregiver found him with slurred speech and right sided weakness. Head CT reveals Acute left MCA territory infarct affecting the insula, caudate nucleus, lentiform nucleus and internal capsule. PMH significant of  Schizoaffective disorder, heart failure with reduced ejection fraction, pretension, DMII, COPD, s/p bilateral amputations, chronic pain, and polysubstance abuse.    PT Comments  Continuing work on functional mobility and activity tolerance;  session focused on mobilizing out of the room, encouraging use of RUE with wc propulsion and transfers; Also used this time with Rui to find out more of what's important to him; was hoping to be able to help Lloyde get to the point where he's wanting to work on transfers in a variety of situations (BSC, couch/sofa, bench, toilet in bathroom, etc); He did talk more about managing in his apartment, bathroom/shower use, and toilet transfers; He verbalizes confidence that he can do those transfers again, but ultimately potlitely declined practicing;   I mentioned perhaps placing a drop-arm BSC to stay by his bed, therefore giving him more independence, and not have to call for and wait on staff -- Kamdin stated he doesn't feel like that would be hygenic enough -- this PT feels that is not unreasonable, and would love to figure out how to get a wheelchair to dedicate to his room, so that he could roll into the bathroom and transfer to the toilet more like home;   We talked about what it would take for safe independent living, and considerations for safety with meals and cooking (Josie lit up and discussed what he liked to cook); also considerations for safety with medications, and Deondra described his pillbox that he used at home --  this PT wonders if we can use the idea of pre-setting a pill box for Aldon here acutely, and if that might help with medication compliance; Ty, RN, and Rice Chamorro, SLP had also talked about ideas to help Rahn with his meds; I emailed members of his care team to p;ull together ideas an Engineer, structural to help [place] with his meds   If plan is discharge home, recommend the following: Supervision due to cognitive status;Direct supervision/assist for financial management;Direct supervision/assist for medications management   Can travel by private vehicle        Equipment Recommendations  None recommended by PT    Recommendations for Other Services OT consult;Speech consult     Precautions / Restrictions Precautions Precautions: Fall Recall of Precautions/Restrictions: Impaired Precaution/Restrictions Comments: h/o BLE amputations     Mobility  Bed Mobility Overal bed mobility: Modified Independent                  Transfers Overall transfer level: Needs assistance Equipment used: None Transfers: Bed to chair/wheelchair/BSC             General transfer comment: Supervision for safety; backed into a standard width wheelchair, with good height during a wheelchair pushup-type move while backing in    Ambulation/Gait                   Psychologist, counselling propulsion: Both upper extremities Wheelchair Assistance Details (indicate cue type and reason): Standard width wheelchair, so slightly, but notably,  more efficient propulsion;  occasionally grazing doorframe, or objects like vitals machine, on the R side; rolled backwards as well   Tilt Bed    Modified Rankin (Stroke Patients Only) Modified Rankin (Stroke Patients Only) Pre-Morbid Rankin Score: Slight disability Modified Rankin: Moderate disability     Balance     Sitting balance-Leahy Scale: Good                                       Communication Communication Communication: Impaired  Cognition Arousal: Alert Behavior During Therapy: WFL for tasks assessed/performed                           PT - Cognition Comments: Participating in problem-solving; see also ST notes Following commands: Intact Following commands impaired: Follows one step commands inconsistently, Follows one step commands with increased time (Responds better when commands are presented more like invitations)    Cueing Cueing Techniques: Verbal cues, Tactile cues, Other (comments)  Exercises Other Exercises Other Exercises: Seated, R UE pushups x10; trunk with anterior lean    General Comments        Pertinent Vitals/Pain Pain Assessment Faces Pain Scale: No hurt    Home Living                          Prior Function            PT Goals (current goals can now be found in the care plan section) Acute Rehab PT Goals Patient Stated Goal: would like to be able to get home PT Goal Formulation: With patient Time For Goal Achievement: 05/16/24 Potential to Achieve Goals: Fair Progress towards PT goals: Progressing toward goals    Frequency    Min 2X/week      PT Plan      Co-evaluation              AM-PAC PT 6 Clicks Mobility   Outcome Measure  Help needed turning from your back to your side while in a flat bed without using bedrails?: None Help needed moving from lying on your back to sitting on the side of a flat bed without using bedrails?: None Help needed moving to and from a bed to a chair (including a wheelchair)?: A Little Help needed standing up from a chair using your arms (e.g., wheelchair or bedside chair)?: Total Help needed to walk in hospital room?: Total Help needed climbing 3-5 steps with a railing? : Total 6 Click Score: 14    End of Session   Activity Tolerance: Patient tolerated treatment well Patient left: Other (comment) (Rolling with Mobility Specialist, who will help  him back to his room) Nurse Communication: Mobility status PT Visit Diagnosis: Other abnormalities of gait and mobility (R26.89);Muscle weakness (generalized) (M62.81);Other symptoms and signs involving the nervous system (R29.898);Hemiplegia and hemiparesis Hemiplegia - Right/Left: Right Hemiplegia - dominant/non-dominant: Dominant Hemiplegia - caused by: Cerebral infarction     Time: 1100-1136 PT Time Calculation (min) (ACUTE ONLY): 36 min  Charges:    $Therapeutic Activity: 8-22 mins $Wheel Chair Management: 8-22 mins PT General Charges $$ ACUTE PT VISIT: 1 Visit                     Darcus Eastern, PT  Acute Rehabilitation Services Office [phone] Secure Chat welcomed    Marcial Setting 05/17/2024, 2:28  PM

## 2024-05-17 NOTE — Progress Notes (Signed)
 PROGRESS NOTE    [name]  YNW:[id number] DOB: [birth year] DOA: 03/04/2024 PCP: Abraham Abo, MD   Brief Narrative: 58 year old with history significant for COPD, cocaine abuse, presented to the ED 1//2025 for slower speech, confusion and right-sided weakness, CT head showed left MCA infarct, CTA head and neck revealed an M2 occlusion on the left,   Echocardiogram revealed large mobile left ventricular thrombus on the left and right ventricular apical thrombus in the right, ejection fraction less than 20%.  Cardiology was consulted recommended DOAC.  PT evaluated patient and recommended skilled nursing facility placement.  Patient was found to lack medical decision capacity/status and not amenable to determining his discharge disposition or  leave AGAINST MEDICAL ADVICE.  At time he is noncompliant with his medication refusing physical examination, oothers time he refused oral medication.  TOC is pursuing guardianship with APS as he is medical decision capacity. Psychiatry presently recommending to await APS decision for guardianship before discharging home.  Patient does not qualify for forced medication administration  No changes in medical condition.   Assessment & Plan:   Principal Problem:   Acute CVA (cerebrovascular accident) (HCC) Active Problems:   LV (left ventricular) mural thrombus   Chronic systolic CHF (congestive heart failure) (HCC)   Prolonged QT interval   Elevated liver enzymes   COPD (chronic obstructive pulmonary disease) (HCC)   Cocaine abuse (HCC)   Schizoaffective disorder, bipolar type (HCC)   Benign essential HTN   Acid reflux   History of DVT (deep vein thrombosis)   Type 2 diabetes mellitus (HCC)   Status post bilateral BKA.   Dilated cardiomyopathy (HCC)  Acute CVA (cerebrovascular accident) (HCC) -He underwent extensive workup and imaging revealed left MCA territory infarct, caudate nucleus, lentiform nucleus and internal capsule MRI  confirmed. -TEE was noted for large mobile left lower clot just under the mitral valve with an EF of 20% and a small right apical clot with reduced RV systolic function. -He was started on Eliquis  for intracardiac thrombus, however he remained noncompliant.  In order to improve compliance, medicine changed to Xarelto  for once daily dosing.  -Psychiatry has deemed the patient to lack capacity and resist compliant with care.   -There is no safe way to force medication on him but he refuses to take it as a result nursing staff and physician simply make multiple attempts throughout the day to convince the patient of the need to comply with his medical therapy. -Does not qualify for forced medication.   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 Last 2D echo showed an EF of less than 20%. -Medication adjusted for better adherence.  Eliquis  changed to Xarelto , Coreg  long-acting.   Schizoaffective disorder bipolar disorder lacks medical capacity: Psych was consulted and recommended that the patient lacks medical capacity to make decisions if the patient attempts to leave AGAINST MEDICAL ADVICE he should be involuntarily committed. Psych recommending APS guardianship, TOC in touch with APS regarding this as patient lacks medical capacity   Essential hypertension:  Pressures are labile.  Currently on Coreg , Lasix , losartan , Aldactone .  Intermittent noncompliance   Hyperlipidemia: Continue Crestor .   Diabetes mellitus type 2:  last A1c of 5.4. refuses monitoring    Polysubstance abuse: UDS was positive for cocaine and benzos.   Deconditioning: Ambulatory status complicated by previous right AKA and left BKA has no insurance coverage for SNF. - refusing therapy at times   History of COPD: Continue inhalers.  History of DVT: On Xarelto  but frequently refuses   No changes on medical condition.   Continue to refuse labs, and meds at times  Estimated body mass index is 20.09 kg/m as calculated from the following:   Height as of this encounter: 5' 9 (1.753 m).   Weight as of this encounter: 61.7 kg.   DVT prophylaxis: Xarelto  Code Status: Full code Family Communication: no family at bedside Disposition Plan:  Status is: Inpatient Remains inpatient appropriate because: Awaiting for safe discharge plan.    Consultants:  Psych neurology    Antimicrobials:    Subjective: Does not wants to answer questions.  Objective: Vitals:   05/16/24 1235 05/16/24 1705 05/16/24 2007 05/17/24 0046  BP: (!) 152/94 (!) 159/124 (!) 164/114 (!) 157/119  Pulse: 75 78 76 79  Resp: 14 16 17 18   Temp:   98 F (36.7 C)   TempSrc:      SpO2: 100% 100% 100% 94%  Weight:      Height:        Intake/Output Summary (Last 24 hours) at 05/17/2024 1407 Last data filed at 05/17/2024 1017 Gross per 24 hour  Intake --  Output 1050 ml  Net -1050 ml   Filed Weights   03/26/24 0431 03/28/24 0456 04/04/24 0500  Weight: 42.7 kg 47.5 kg 61.7 kg    Examination:  General exam: NAD Respiratory system:Normal Resp effort Central nervous system: Alert Left BKA, Right AKA  Data Reviewed: I have personally reviewed following labs and imaging studies  CBC: No results for input(s): WBC, NEUTROABS, HGB, HCT, MCV, PLT in the last 168 hours. Basic Metabolic Panel: No results for input(s): NA, K, CL, CO2, GLUCOSE, BUN, CREATININE, CALCIUM , MG, PHOS in the last 168 hours. GFR: CrCl cannot be calculated (Patient's most recent lab result is older than the maximum 21 days allowed.). Liver Function Tests: No results for input(s): AST, ALT, ALKPHOS, BILITOT, PROT, ALBUMIN in the last 168 hours. No results for input(s): LIPASE, AMYLASE in the last 168 hours. No results for input(s): AMMONIA in the last 168 hours. Coagulation Profile: No results for  input(s): INR, PROTIME in the last 168 hours. Cardiac Enzymes: No results for input(s): CKTOTAL, CKMB, CKMBINDEX, TROPONINI in the last 168 hours. BNP (last 3 results) No results for input(s): PROBNP in the last 8760 hours. HbA1C: No results for input(s): HGBA1C in the last 72 hours. CBG: No results for input(s): GLUCAP in the last 168 hours. Lipid Profile: No results for input(s): CHOL, HDL, LDLCALC, TRIG, CHOLHDL, LDLDIRECT in the last 72 hours. Thyroid  Function Tests: No results for input(s): TSH, T4TOTAL, FREET4, T3FREE, THYROIDAB in the last 72 hours. Anemia Panel: No results for input(s): VITAMINB12, FOLATE, FERRITIN, TIBC, IRON, RETICCTPCT in the last 72 hours. Sepsis Labs: No results for input(s): PROCALCITON, LATICACIDVEN in the last 168 hours.  No results found for this or any previous visit (from the past 240 hours).       Radiology Studies: No results found.      Scheduled Meds:  carvedilol   10 mg Oral Daily   furosemide   40 mg Oral Daily   gabapentin   200 mg Oral BID   losartan   100 mg Oral Daily   revefenacin   175 mcg Nebulization Daily   rivaroxaban   20 mg Oral Q supper   rosuvastatin   5 mg Oral QHS   spironolactone   25 mg Oral Daily   Continuous Infusions:   LOS: 74 days    Time spent: 35 minutes  Danette Duos, MD [facility]   If 7PM-7AM, please contact night-coverage [email or web address]  05/17/2024, 2:07 PM

## 2024-05-17 NOTE — Progress Notes (Signed)
 Mobility Specialist Progress Note:   05/17/24 1300  Mobility  Activity Transferred from chair to bed  Level of Assistance Standby assist, set-up cues, supervision of patient - no hands on  Assistive Device Wheelchair  Distance Ambulated (ft) 100 ft (propelled in hallway)  Activity Response Tolerated well  Mobility Referral Yes  Mobility visit 1 Mobility  Mobility Specialist Start Time (ACUTE ONLY) 1130  Mobility Specialist Stop Time (ACUTE ONLY) 1225  Mobility Specialist Time Calculation (min) (ACUTE ONLY) 55 min   Pt received in hallway via wheelchair from PT. Asymptomatic w/ no complaints. Returned to room w/o fault. Able to anterior scoot back to bed w/ supervision. Pt left in bed with call bell and all needs met.  D'Vante Nolon Baxter Mobility Specialist Please contact via Special educational needs teacher or Rehab office at [phone]

## 2024-05-17 NOTE — Plan of Care (Signed)
  Problem: Nutrition: Goal: Adequate nutrition will be maintained 05/17/2024 2335 by Anitra Barn, RN Outcome: Progressing 05/17/2024 2335 by Anitra Barn, RN Outcome: Progressing   Problem: Coping: Goal: Level of anxiety will decrease 05/17/2024 2335 by Anitra Barn, RN Outcome: Progressing 05/17/2024 2335 by Anitra Barn, RN Outcome: Progressing   Problem: Elimination: Goal: Will not experience complications related to bowel motility 05/17/2024 2335 by Anitra Barn, RN Outcome: Progressing 05/17/2024 2335 by Anitra Barn, RN Outcome: Progressing Goal: Will not experience complications related to urinary retention 05/17/2024 2335 by Anitra Barn, RN Outcome: Progressing 05/17/2024 2335 by Anitra Barn, RN Outcome: Progressing   Problem: Pain Managment: Goal: General experience of comfort will improve and/or be controlled 05/17/2024 2335 by Anitra Barn, RN Outcome: Progressing 05/17/2024 2335 by Anitra Barn, RN Outcome: Progressing   Problem: Safety: Goal: Ability to remain free from injury will improve 05/17/2024 2335 by Anitra Barn, RN Outcome: Progressing 05/17/2024 2335 by Anitra Barn, RN Outcome: Progressing   Problem: Skin Integrity: Goal: Risk for impaired skin integrity will decrease 05/17/2024 2335 by Anitra Barn, RN Outcome: Progressing 05/17/2024 2335 by Anitra Barn, RN Outcome: Progressing

## 2024-05-18 DIAGNOSIS — F99 Mental disorder, not otherwise specified: Secondary | ICD-10-CM

## 2024-05-18 DIAGNOSIS — Z789 Other specified health status: Secondary | ICD-10-CM | POA: Diagnosis not present

## 2024-05-18 DIAGNOSIS — Z556 Problems related to health literacy: Secondary | ICD-10-CM | POA: Diagnosis not present

## 2024-05-18 DIAGNOSIS — I513 Intracardiac thrombosis, not elsewhere classified: Secondary | ICD-10-CM | POA: Diagnosis not present

## 2024-05-18 DIAGNOSIS — I639 Cerebral infarction, unspecified: Secondary | ICD-10-CM | POA: Diagnosis not present

## 2024-05-18 NOTE — Progress Notes (Signed)
 Progress Note    [name]   [id number]:[id number]  DOB: 1966/04/04  DOA: 03/04/2024     77 PCP: Abraham Abo, MD  Initial CC: Slurred speech, confusion  Hospital Course: 58 year old with history significant for COPD, cocaine abuse, presented to the ED 03/04/24 for slower speech, confusion and right-sided weakness, CT head showed left MCA infarct, CTA head and neck revealed an M2 occlusion on the left,   Echocardiogram revealed large mobile left ventricular thrombus on the left and right ventricular apical thrombus in the right, ejection fraction less than 20%.  Cardiology was consulted recommended DOAC.  PT evaluated patient and recommended skilled nursing facility placement.  Patient was found to lack medical decision capacity/status and not amenable to determining his discharge disposition or leave AGAINST MEDICAL ADVICE.   He intermittently declines physical exams and medications. TOC is pursuing guardianship with APS as he is lacking medical decision capacity. Psychiatry presently recommending to await APS decision for guardianship before discharging home.  Patient does not qualify for forced medication administration   No changes in medical condition.   Assessment & Plan:  Principal Problem:   Acute CVA (cerebrovascular accident) (HCC) Active Problems:   LV (left ventricular) mural thrombus   Chronic systolic CHF (congestive heart failure) (HCC)   Prolonged QT interval   Elevated liver enzymes   COPD (chronic obstructive pulmonary disease) (HCC)   Cocaine abuse (HCC)   Schizoaffective disorder, bipolar type (HCC)   Benign essential HTN   Acid reflux   History of DVT (deep vein thrombosis)   Type 2 diabetes mellitus (HCC)   Status post bilateral BKA.   Dilated cardiomyopathy (HCC)     Acute CVA (cerebrovascular accident) (HCC) -He underwent extensive workup and imaging revealed left MCA territory infarct, caudate nucleus, lentiform nucleus and internal capsule MRI  confirmed. -TEE was noted for large mobile left lower clot just under the mitral valve with an EF of 20% and a small right apical clot with reduced RV systolic function. -He was started on Eliquis  for intracardiac thrombus, however he remained noncompliant.  In order to improve compliance, medicine changed to Xarelto  for once daily dosing -Psychiatry has deemed the patient to lack capacity; he continues to remain noncompliant at times -Unable to force meds.  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  Eliquis  changed to Xarelto , Coreg  long-acting.   Schizoaffective disorder bipolar disorder lacks medical capacity: Psych was consulted and recommended that the patient lacks medical capacity to make decisions if the patient attempts to leave AGAINST MEDICAL ADVICE he should be involuntarily committed. Psych recommending APS guardianship, TOC in touch with APS regarding this as patient lacks medical capacity   Essential hypertension:  Pressures are labile.  Currently on Coreg , Lasix , losartan , Aldactone .  Intermittent noncompliance  Hyperlipidemia: Continue Crestor .   Diabetes mellitus type 2:  last A1c of 5.4. refuses monitoring    Polysubstance abuse: UDS was positive for cocaine and benzos.   Deconditioning: Ambulatory status complicated by previous right AKA and left BKA has no insurance coverage for SNF. - refusing therapy at times   History of COPD: Continue inhalers   History of DVT: On Xarelto  but frequently refuses   Estimated body mass index is 20.09 kg/m as calculated from the following:   Height as of this encounter: 5' 9 (1.753  m).   Weight as of this encounter: 61.7 kg   Interval History:  No events overnight.  Resting in bed comfortable this  morning.  Was not wishing to talk much or allow much physical exam.  Doing okay otherwise.    Old records reviewed in assessment of this patient  Antimicrobials:   DVT prophylaxis:   rivaroxaban  (XARELTO ) tablet 20 mg   Code Status:   Code Status: Full Code  Mobility Assessment (Last 72 Hours)     Mobility Assessment     Row Name 05/18/24 1200 05/17/24 2143 05/17/24 1400 05/17/24 0850 05/16/24 0845   Does patient have an order for bedrest or is patient medically unstable No - Continue assessment No - Continue assessment -- No - Continue assessment No - Continue assessment   What is the highest level of mobility based on the progressive mobility assessment? Level 2 (Chairfast) - Balance while sitting on edge of bed and cannot stand Level 2 (Chairfast) - Balance while sitting on edge of bed and cannot stand Level 2 (Chairfast) - Balance while sitting on edge of bed and cannot stand Level 2 (Chairfast) - Balance while sitting on edge of bed and cannot stand Level 2 (Chairfast) - Balance while sitting on edge of bed and cannot stand   Is the above level different from baseline mobility prior to current illness? Yes - Recommend PT order Yes - Recommend PT order -- Yes - Recommend PT order Yes - Recommend PT order    Row Name 05/15/24 2000           Does patient have an order for bedrest or is patient medically unstable No - Continue assessment       What is the highest level of mobility based on the progressive mobility assessment? Level 2 (Chairfast) - Balance while sitting on edge of bed and cannot stand       Is the above level different from baseline mobility prior to current illness? Yes - Recommend PT order          Barriers to discharge: APS to obtain guardianship Disposition Plan:  TBD HH orders placed: n/a Status is: Inpt  Objective: Blood pressure (!) 159/104, pulse 74, temperature 98 F (36.7 C), resp. rate 18, height 5' 9 (1.753 m), weight 61.7 kg, SpO2 98%.   Examination:  Physical Exam Constitutional:      General: He is not in acute distress.    Appearance: Normal appearance.     Comments: Patient not willing to allow full exam  HENT:     Head: Normocephalic and atraumatic.     Mouth/Throat:     Mouth: Mucous membranes are moist.   Eyes:     Extraocular Movements: Extraocular movements intact.   Abdominal:     General: Bowel sounds are normal. There is no distension.     Palpations: Abdomen is soft.   Musculoskeletal:     Cervical back: Normal range of motion and neck supple.     Comments: Lower extremity amputations noted   Skin:    General: Skin is warm and dry.   Neurological:     Mental Status: He is alert and oriented to person, place, and time.      Consultants:  Psych  Procedures:    Data Reviewed: No results found for this or any previous visit (from the past 24 hours).  I have reviewed pertinent nursing notes, vitals, labs, and images as necessary. I have ordered labwork to follow up on  as indicated.  I have reviewed the last notes from staff over past 24 hours. I have discussed patient's care plan and test results with nursing staff, CM/SW, and other staff as appropriate.  Time spent: Greater than 50% of the 55 minute visit was spent in counseling/coordination of care for the patient as laid out in the A&P.   LOS: 75 days   Faith Homes, MD [facility] 05/18/2024, 1:51 PM

## 2024-05-18 NOTE — Progress Notes (Signed)
 Occupational Therapy Treatment Patient Details Name: [name] MRN: [id number] DOB: [birth year] Today's Date: 05/18/2024   History of present illness Patient is 58 y.o. male presented to ED on 03/04/24 with stroke like symptoms when caregiver found him with slurred speech and right sided weakness. Head CT reveals Acute left MCA territory infarct affecting the insula, caudate nucleus, lentiform nucleus and internal capsule. PMH significant of  Schizoaffective disorder, heart failure with reduced ejection fraction, pretension, DMII, COPD, s/p bilateral amputations, chronic pain, and polysubstance abuse.   OT comments  Pt continues to have deficits with RUE coordination and grip strength remains weaker relative to his LUE despite it being his dominant UE. Worked with pt on more coordination activities with him using his RUE for function reach and manipulation tasks of cups across various planes. Also provided pink theraputty to increase grip strength. Per pt, he prefers we educate him on activities to do independently vs continuous rote tasks, however his compliance to and ind HEP is questionable. Some reports indicate that pt refuses meds, he denies these claims. Will continue efforts of collaborating with staff to have pt under a good medication management routine. DC plans remain appropriate for skilled unless a HH aide can assist at DC.        If plan is discharge home, recommend the following:  A little help with walking and/or transfers;A little help with bathing/dressing/bathroom;Supervision due to cognitive status;Direct supervision/assist for financial management;Direct supervision/assist for medications management   Equipment Recommendations  None recommended by OT    Recommendations for Other Services      Precautions / Restrictions Precautions Precautions: Fall Recall of Precautions/Restrictions: Impaired Precaution/Restrictions Comments: h/o BLE amputations Restrictions Weight  Bearing Restrictions Per Provider Order: No       Mobility Bed Mobility                    Transfers                         Balance                                           ADL either performed or assessed with clinical judgement   ADL                                         General ADL Comments: Educated pt on RUE coordination activities with use of cups. Left cups in room for continued practice    Extremity/Trunk Assessment Upper Extremity Assessment RUE Deficits / Details: ROM and grip strength WFL, impaired coordination with FTN and reaching tasks, decreased proprioception. Difficulty manipulating smaller or awkward shaped objects (clothes pins) RUE Coordination: decreased fine motor;decreased gross motor            Vision       Perception     Praxis     Communication Communication Communication: Impaired Factors Affecting Communication: Difficulty expressing self;Reduced clarity of speech   Cognition Arousal: Alert Behavior During Therapy: WFL for tasks assessed/performed Cognition: Cognition impaired Difficult to assess due to: Impaired communication   Awareness: Online awareness impaired, Intellectual awareness intact Memory impairment (select all impairments): Working memory Attention impairment (select first level of impairment): Selective attention Executive functioning impairment (select all impairments): Reasoning,  Problem solving                   Following commands: Intact Following commands impaired: Follows one step commands inconsistently, Follows one step commands with increased time (Responds better when commands are presented more like invitations)      Cueing   Cueing Techniques: Verbal cues, Tactile cues, Other (comments)  Exercises Other Exercises Other Exercises: pink theraputty squeezes RUE Other Exercises: Digit ext against resistive pink theraputty all R digits. Other  Exercises: Cup stacking, cup shuffling (monte), and pyramid making with RUE. Repetition x5    Shoulder Instructions       General Comments      Pertinent Vitals/ Pain       Pain Assessment Pain Assessment: No/denies pain  Home Living                                          Prior Functioning/Environment              Frequency  Min 1X/week        Progress Toward Goals  OT Goals(current goals can now be found in the care plan section)  Progress towards OT goals: Progressing toward goals  Acute Rehab OT Goals Patient Stated Goal: To keep working on R arm OT Goal Formulation: With patient Time For Goal Achievement: 05/25/24 Potential to Achieve Goals: Fair  Plan      Co-evaluation                 AM-PAC OT 6 Clicks Daily Activity     Outcome Measure   Help from another person eating meals?: None Help from another person taking care of personal grooming?: A Little Help from another person toileting, which includes using toliet, bedpan, or urinal?: A Lot Help from another person bathing (including washing, rinsing, drying)?: A Little Help from another person to put on and taking off regular upper body clothing?: A Little Help from another person to put on and taking off regular lower body clothing?: A Little 6 Click Score: 18    End of Session    OT Visit Diagnosis: Unsteadiness on feet (R26.81);Other abnormalities of gait and mobility (R26.89);Muscle weakness (generalized) (M62.81);Other symptoms and signs involving the nervous system (R29.898);Other symptoms and signs involving cognitive function;Hemiplegia and hemiparesis Hemiplegia - Right/Left: Right Hemiplegia - dominant/non-dominant: Dominant Hemiplegia - caused by: Cerebral infarction   Activity Tolerance Patient tolerated treatment well   Patient Left in bed;with call bell/phone within reach;with bed alarm set;with family/visitor present   Nurse Communication Mobility  status        Time: 0981-1914 OT Time Calculation (min): 25 min  Charges: OT General Charges $OT Visit: 1 Visit OT Treatments $Therapeutic Activity: 23-37 mins  05/18/2024  AB, OTR/L  Acute Rehabilitation Services  Office: [phone]   [name] 05/18/2024, 5:10 PM

## 2024-05-19 DIAGNOSIS — I639 Cerebral infarction, unspecified: Secondary | ICD-10-CM | POA: Diagnosis not present

## 2024-05-19 DIAGNOSIS — Z789 Other specified health status: Secondary | ICD-10-CM | POA: Diagnosis not present

## 2024-05-19 NOTE — Progress Notes (Signed)
 Plan of care reviewed. Pt has been progressing. He is stable hemodynamically. Pt has refused to check his temperature  tonight. No obvious acute distress overnight. He is able to rest well with no complaints. We will continue to monitor.  Brain Cahill, RN

## 2024-05-19 NOTE — Progress Notes (Signed)
 Speech Language Pathology Treatment: Cognitive-Linguistic  Patient Details Name: [name] MRN: [id number] DOB: 1966-09-30 Today's Date: 05/19/2024 Time: 2595-6387 SLP Time Calculation (min) (ACUTE ONLY): 22 min  Assessment / Plan / Recommendation Clinical Impression  Pt was agreeable to trying a simulated pill box task today. After initial instruction and demonstration, pt was tasked with reading the instructions for medications and then scanning the pill box to assess for any errors. Pt initially was making errors that appeared to be related to reduced selective attention more than comprehension of instructions (questioned vision for reading but pt says he can see it very clearly, and did not appear to have trouble visually finding pills in the pictures). Pt required Mod cues to identify the errors, but was able to problem solve how to correct the errors with Mod I. SLP broached the subject of creating a similar chart for his personal medications, so he can increase autonomy and independence. Pt was a little defensive throughout the task, saying that he was not stupid and that he knows what medications he is supposed to take and when. He was open to the idea of SLP making a memory aid for him, although a little noncommittal in if he would use it. Since he seems to have increased participation since initiation of his calendar and knowing when to anticipate SLP arrival, will go ahead and work on getting something that may be useful for him.    HPI HPI: Patient is 58 y.o. male presented to ED on 03/04/24 with stroke like symptoms when caregiver found him with slurred speech and right sided weakness. Head CT reveals Acute left MCA territory infarct affecting the insula, caudate nucleus, lentiform nucleus and internal capsule. Pt initially evaluated by SLP 4/5 but s/o 4/22 due to repeated refusals. SLP reordered 5/13 for repeat cognitive-linguistic eval.   PMH significant of  Schizoaffective disorder,  heart failure with reduced ejection fraction, pretension, DMII, COPD, s/p bilateral amputations, chronic pain, and polysubstance abuse.      SLP Plan  Continue with current plan of care          Recommendations                         Intermittent Supervision/Assistance Dysarthria and anarthria (R47.1);Cognitive communication deficit (R41.841)     Continue with current plan of care     Beth Brooke., M.A. CCC-SLP Acute Rehabilitation Services Office: [phone]  Secure chat preferred   05/19/2024, 3:51 PM

## 2024-05-19 NOTE — Progress Notes (Signed)
 Progress Note    [name]   AOZ:[id number]  DOB: 1966/03/09  DOA: 03/04/2024     78 PCP: Abraham Abo, MD  Initial CC: Slurred speech, confusion  Hospital Course: 58 year old with history significant for COPD, cocaine abuse, presented to the ED 03/04/24 for slower speech, confusion and right-sided weakness, CT head showed left MCA infarct, CTA head and neck revealed an M2 occlusion on the left,   Echocardiogram revealed large mobile left ventricular thrombus on the left and right ventricular apical thrombus in the right, ejection fraction less than 20%.  Cardiology was consulted recommended DOAC.  PT evaluated patient and recommended skilled nursing facility placement.  Patient was found to lack medical decision capacity/status and not amenable to determining his discharge disposition or leave AGAINST MEDICAL ADVICE.   He intermittently declines physical exams and medications. TOC is pursuing guardianship with APS as he is lacking medical decision capacity. Psychiatry presently recommending to await APS decision for guardianship before discharging home.  Patient does not qualify for forced medication administration   No changes in medical condition.   Assessment & Plan:  Principal Problem:   Acute CVA (cerebrovascular accident) (HCC) Active Problems:   LV (left ventricular) mural thrombus   Chronic systolic CHF (congestive heart failure) (HCC)   Prolonged QT interval   Elevated liver enzymes   COPD (chronic obstructive pulmonary disease) (HCC)   Cocaine abuse (HCC)   Schizoaffective disorder, bipolar type (HCC)   Benign essential HTN   Acid reflux   History of DVT (deep vein thrombosis)   Type 2 diabetes mellitus (HCC)   Status post bilateral BKA.   Dilated cardiomyopathy (HCC)    Acute CVA (cerebrovascular accident) (HCC) -He underwent extensive workup and imaging revealed left MCA territory infarct, caudate nucleus, lentiform nucleus and internal capsule MRI  confirmed. -TEE was noted for large mobile left lower clot just under the mitral valve with an EF of 20% and a small right apical clot with reduced RV systolic function. -He was started on Eliquis  for intracardiac thrombus, however he remained noncompliant.  In order to improve compliance, medicine changed to Xarelto  for once daily dosing -Psychiatry has deemed the patient to lack capacity; he continues to remain noncompliant at times -Unable to force meds.  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  Eliquis  changed to Xarelto , Coreg  long-acting.   Schizoaffective disorder bipolar disorder lacks medical capacity: Psych was consulted and recommended that the patient lacks medical capacity to make decisions if the patient attempts to leave AGAINST MEDICAL ADVICE he should be involuntarily committed. Psych recommending APS guardianship, TOC in touch with APS regarding this as patient lacks medical capacity   Essential hypertension:  Pressures are labile.  Currently on Coreg , Lasix , losartan , Aldactone .  Intermittent noncompliance  Hyperlipidemia: Continue Crestor .   Diabetes mellitus type 2:  last A1c of 5.4. refuses monitoring    Polysubstance abuse: UDS was positive for cocaine and benzos.   Deconditioning: Ambulatory status complicated by previous right AKA and left BKA has no insurance coverage for SNF. - refusing therapy at times   History of COPD: Continue inhalers   History of DVT: On Xarelto  but frequently refuses   Estimated body mass index is 20.09 kg/m as calculated from the following:   Height as of this encounter: 5' 9 (1.753 m).  Weight as of this encounter: 61.7 kg   Interval History:  No events overnight.  Resting in bed comfortable this  morning.  Does not like being woken up multiple times in the morning otherwise he is doing okay.   Old records reviewed in assessment of this patient  Antimicrobials:   DVT prophylaxis:   rivaroxaban  (XARELTO ) tablet 20 mg   Code Status:   Code Status: Full Code  Mobility Assessment (Last 72 Hours)     Mobility Assessment     Row Name 05/19/24 0800 05/19/24 0542 05/18/24 2010 05/18/24 1709 05/18/24 1200   Does patient have an order for bedrest or is patient medically unstable No - Continue assessment No - Continue assessment No - Continue assessment -- No - Continue assessment   What is the highest level of mobility based on the progressive mobility assessment? Level 2 (Chairfast) - Balance while sitting on edge of bed and cannot stand Level 2 (Chairfast) - Balance while sitting on edge of bed and cannot stand Level 2 (Chairfast) - Balance while sitting on edge of bed and cannot stand Level 2 (Chairfast) - Balance while sitting on edge of bed and cannot stand Level 2 (Chairfast) - Balance while sitting on edge of bed and cannot stand   Is the above level different from baseline mobility prior to current illness? Yes - Recommend PT order Yes - Recommend PT order Yes - Recommend PT order -- Yes - Recommend PT order    Row Name 05/17/24 2143 05/17/24 1400 05/17/24 0850       Does patient have an order for bedrest or is patient medically unstable No - Continue assessment -- No - Continue assessment     What is the highest level of mobility based on the progressive mobility assessment? Level 2 (Chairfast) - Balance while sitting on edge of bed and cannot stand Level 2 (Chairfast) - Balance while sitting on edge of bed and cannot stand Level 2 (Chairfast) - Balance while sitting on edge of bed and cannot stand     Is the above level different from baseline mobility prior to current illness? Yes - Recommend PT order -- Yes - Recommend PT order        Barriers to discharge: APS to obtain  guardianship Disposition Plan:  TBD HH orders placed: n/a Status is: Inpt  Objective: Blood pressure 138/88, pulse 74, temperature 98 F (36.7 C), resp. rate 19, height 5' 9 (1.753 m), weight 61.7 kg, SpO2 99%.  Examination:  Physical Exam Constitutional:      General: He is not in acute distress.    Appearance: Normal appearance.     Comments: Patient not willing to allow full exam  HENT:     Head: Normocephalic and atraumatic.     Mouth/Throat:     Mouth: Mucous membranes are moist.   Eyes:     Extraocular Movements: Extraocular movements intact.   Abdominal:     General: Bowel sounds are normal. There is no distension.     Palpations: Abdomen is soft.   Musculoskeletal:     Cervical back: Normal range of motion and neck supple.     Comments: Lower extremity amputations noted   Skin:    General: Skin is warm and dry.   Neurological:     Mental Status: He is alert and oriented to person, place, and time.      Consultants:  Psych  Procedures:    Data Reviewed: No results found for this or  any previous visit (from the past 24 hours).  I have reviewed pertinent nursing notes, vitals, labs, and images as necessary. I have ordered labwork to follow up on as indicated.  I have reviewed the last notes from staff over past 24 hours. I have discussed patient's care plan and test results with nursing staff, CM/SW, and other staff as appropriate.     LOS: 76 days   Faith Homes, MD [facility] 05/19/2024, 2:49 PM

## 2024-05-20 DIAGNOSIS — Z789 Other specified health status: Secondary | ICD-10-CM | POA: Diagnosis not present

## 2024-05-20 DIAGNOSIS — I639 Cerebral infarction, unspecified: Secondary | ICD-10-CM | POA: Diagnosis not present

## 2024-05-20 NOTE — TOC Progression Note (Signed)
 Transition of Care [facility]) - Progression Note    Patient Details  Name: [name] MRN: [id number] Date of Birth: August 16, 1966  Transition [facility]) CM/SW Contact  Tandy Fam, [name] Phone Number: 05/20/2024, 3:59 PM  Clinical Narrative:   CSW spoke with Kathaleen Pale with DSS regarding patient's case. CSW provided update on patient lacking capacity per hospital staff, awaiting guardianship hearing, but due for disability renewal and no interim guardian while awaiting hearing. Joe to review patient's information to see if an interim guardian with DSS can be found, and will call CSW back.     Expected Discharge Plan: Skilled Nursing Facility Barriers to Discharge: English as a second language teacher, Active Substance Use - Placement, SNF Pending bed offer  Expected Discharge Plan and Services       Living arrangements for the past 2 months: Apartment Expected Discharge Date: 03/14/24                                     Social Determinants of Health (SDOH) Interventions SDOH Screenings   Food Insecurity: Food Insecurity Present (03/06/2024)  Housing: High Risk (03/06/2024)  Transportation Needs: No Transportation Needs (03/06/2024)  Utilities: Patient Declined (03/06/2024)  Alcohol Screen: Medium Risk (06/24/2019)  Depression (PHQ2-9): Low Risk  (02/18/2024)  Recent Concern: Depression (PHQ2-9) - Medium Risk (01/21/2024)  Financial Resource Strain: High Risk (07/24/2021)   Received from [facility]  Physical Activity: Inactive (02/18/2024)  Social Connections: Unknown (04/05/2022)   Received from [facility]  Stress: Stress Concern Present (07/24/2021)   Received from [facility]  Tobacco Use: High Risk (03/04/2024)  Health Literacy: Adequate Health Literacy (02/18/2024)    Readmission Risk Interventions    12/21/2023    9:25 AM 12/09/2022    9:02 AM 12/01/2022    3:56 PM  Readmission Risk Prevention Plan  Transportation Screening Complete Complete Complete  HRI or Home Care  Consult Complete    Social Work Consult for Recovery Care Planning/Counseling Complete    Palliative Care Screening Not Applicable    Medication Review Oceanographer) Complete Complete Complete  PCP or Specialist appointment within 3-5 days of discharge  Complete Complete  HRI or Home Care Consult  Complete Complete  SW Recovery Care/Counseling Consult  Complete Complete  Palliative Care Screening  Not Applicable Not Applicable  Skilled Nursing Facility  Not Applicable Not Applicable  SNF Comments   n/a

## 2024-05-20 NOTE — Progress Notes (Signed)
 Mobility Specialist Progress Note:    05/20/24 1100  Mobility  Activity Transferred from bed to chair;Transferred from chair to bed  Level of Assistance Standby assist, set-up cues, supervision of patient - no hands on  Assistive Device Wheelchair  Distance Ambulated (ft) 100 ft (propelled in wheechair)  Activity Response Tolerated well  Mobility Referral Yes  Mobility visit 1 Mobility  Mobility Specialist Start Time (ACUTE ONLY) 1055  Mobility Specialist Stop Time (ACUTE ONLY) 1131  Mobility Specialist Time Calculation (min) (ACUTE ONLY) 36 min   Pt received in bed and agreeable. Able to completed posterior scoot in to wheelchair. Propelled wheelchair outside and proceeded to light a cigarette. MS immediately ceased this and returned pt to room. RN notified. Pt left in bed with call bell and bed alarm on.  D'Vante Nolon Baxter Mobility Specialist Please contact via Special educational needs teacher or Rehab office at [phone]

## 2024-05-20 NOTE — Progress Notes (Signed)
 Progress Note    [name]   RUE:[id number]  DOB: 1965/12/16  DOA: 03/04/2024     79 PCP: Abraham Abo, MD  Initial CC: Slurred speech, confusion  Hospital Course: 58 year old with history significant for COPD, cocaine abuse, presented to the ED 03/04/24 for slower speech, confusion and right-sided weakness, CT head showed left MCA infarct, CTA head and neck revealed an M2 occlusion on the left,   Echocardiogram revealed large mobile left ventricular thrombus on the left and right ventricular apical thrombus in the right, ejection fraction less than 20%.  Cardiology was consulted recommended DOAC.  PT evaluated patient and recommended skilled nursing facility placement.  Patient was found to lack medical decision capacity/status and not amenable to determining his discharge disposition or leave AGAINST MEDICAL ADVICE.   He intermittently declines physical exams and medications. TOC is pursuing guardianship with APS as he is lacking medical decision capacity. Psychiatry presently recommending to await APS decision for guardianship before discharging home.  Patient does not qualify for forced medication administration   No changes in medical condition.   Assessment & Plan:  Principal Problem:   Acute CVA (cerebrovascular accident) (HCC) Active Problems:   LV (left ventricular) mural thrombus   Chronic systolic CHF (congestive heart failure) (HCC)   Prolonged QT interval   Elevated liver enzymes   COPD (chronic obstructive pulmonary disease) (HCC)   Cocaine abuse (HCC)   Schizoaffective disorder, bipolar type (HCC)   Benign essential HTN   Acid reflux   History of DVT (deep vein thrombosis)   Type 2 diabetes mellitus (HCC)   Status post bilateral BKA.   Dilated cardiomyopathy (HCC)    Acute CVA (cerebrovascular accident) (HCC) -He underwent extensive workup and imaging revealed left MCA territory infarct, caudate nucleus, lentiform nucleus and internal capsule MRI  confirmed. -TEE was noted for large mobile left lower clot just under the mitral valve with an EF of 20% and a small right apical clot with reduced RV systolic function. -He was started on Eliquis  for intracardiac thrombus, however he remained noncompliant.  In order to improve compliance, medicine changed to Xarelto  for once daily dosing -Psychiatry has deemed the patient to lack capacity; he continues to remain noncompliant at times -Unable to force meds.  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  Eliquis  changed to Xarelto , Coreg  long-acting.   Schizoaffective disorder bipolar disorder lacks medical capacity: Psych was consulted and recommended that the patient lacks medical capacity to make decisions if the patient attempts to leave AGAINST MEDICAL ADVICE he should be involuntarily committed. Psych recommending APS guardianship, TOC in touch with APS regarding this as patient lacks medical capacity   Essential hypertension:  Pressures are labile.  Currently on Coreg , Lasix , losartan , Aldactone .  Intermittent noncompliance  Hyperlipidemia: Continue Crestor .   Diabetes mellitus type 2:  last A1c of 5.4. refuses monitoring    Polysubstance abuse: UDS was positive for cocaine and benzos.   Deconditioning: Ambulatory status complicated by previous right AKA and left BKA has no insurance coverage for SNF. - refusing therapy at times   History of COPD: Continue inhalers   History of DVT: On Xarelto  but frequently refuses   Estimated body mass index is 20.09 kg/m as calculated from the following:   Height as of this encounter: 5' 9 (1.753 m).  Weight as of this encounter: 61.7 kg   Interval History:  No events overnight.  Awake and sitting on side of bed  today.  Looks like disability paperwork is due on 05/30/2024 which is potentially after any potential court date that would assign guardianship.  Old records reviewed in assessment of this patient  Antimicrobials:   DVT prophylaxis:   rivaroxaban  (XARELTO ) tablet 20 mg   Code Status:   Code Status: Full Code  Mobility Assessment (Last 72 Hours)     Mobility Assessment     Row Name 05/20/24 0800 05/19/24 0800 05/19/24 0542 05/18/24 2010 05/18/24 1709   Does patient have an order for bedrest or is patient medically unstable No - Continue assessment No - Continue assessment No - Continue assessment No - Continue assessment --   What is the highest level of mobility based on the progressive mobility assessment? Level 2 (Chairfast) - Balance while sitting on edge of bed and cannot stand Level 2 (Chairfast) - Balance while sitting on edge of bed and cannot stand Level 2 (Chairfast) - Balance while sitting on edge of bed and cannot stand Level 2 (Chairfast) - Balance while sitting on edge of bed and cannot stand Level 2 (Chairfast) - Balance while sitting on edge of bed and cannot stand   Is the above level different from baseline mobility prior to current illness? Yes - Recommend PT order Yes - Recommend PT order Yes - Recommend PT order Yes - Recommend PT order --    Row Name 05/18/24 1200 05/17/24 2143         Does patient have an order for bedrest or is patient medically unstable No - Continue assessment No - Continue assessment      What is the highest level of mobility based on the progressive mobility assessment? Level 2 (Chairfast) - Balance while sitting on edge of bed and cannot stand Level 2 (Chairfast) - Balance while sitting on edge of bed and cannot stand      Is the above level different from baseline mobility prior to current illness? Yes - Recommend PT order Yes - Recommend PT order         Barriers to discharge: APS to obtain guardianship Disposition Plan:  TBD HH orders  placed: n/a Status is: Inpt  Objective: Blood pressure (!) 155/108, pulse 79, temperature 98 F (36.7 C), resp. rate 19, height 5' 9 (1.753 m), weight 61.7 kg, SpO2 95%.  Examination:  Physical Exam Constitutional:      General: He is not in acute distress.    Appearance: Normal appearance.     Comments: Patient not willing to allow full exam  HENT:     Head: Normocephalic and atraumatic.     Mouth/Throat:     Mouth: Mucous membranes are moist.   Eyes:     Extraocular Movements: Extraocular movements intact.   Abdominal:     General: Bowel sounds are normal. There is no distension.     Palpations: Abdomen is soft.   Musculoskeletal:     Cervical back: Normal range of motion and neck supple.     Comments: Lower extremity amputations noted   Skin:    General: Skin is warm and dry.   Neurological:     Mental Status: He is alert and oriented to person, place, and time.      Consultants:  Psych  Procedures:    Data Reviewed: No results found for this or any previous visit (from the past 24  hours).  I have reviewed pertinent nursing notes, vitals, labs, and images as necessary. I have ordered labwork to follow up on as indicated.  I have reviewed the last notes from staff over past 24 hours. I have discussed patient's care plan and test results with nursing staff, CM/SW, and other staff as appropriate.     LOS: 77 days   Faith Homes, MD [facility] 05/20/2024, 4:05 PM

## 2024-05-20 NOTE — TOC Progression Note (Signed)
 Transition of Care [facility]) - Progression Note    Patient Details  Name: [name] MRN: [id number] Date of Birth: 1966-01-29  Transition of Care [facility]) CM/SW Contact  Tandy Fam, [name] Phone Number: 05/20/2024, 3:58 PM  Clinical Narrative:   CSW spoke with [facility] Supervisor about patient's disability paperwork. Patient is not able to sign if he does not have capacity, would need a guardian or interim guardian. Hearing is scheduled for 6/26, there is no information available for any guardian ad litem. CSW attempted to reach Livia Riffle with DSS to check for interim guardian while awaiting court hearing, left a voicemail.    Expected Discharge Plan: Skilled Nursing Facility Barriers to Discharge: English as a second language teacher, Active Substance Use - Placement, SNF Pending bed offer  Expected Discharge Plan and Services       Living arrangements for the past 2 months: Apartment Expected Discharge Date: 03/14/24                                     Social Determinants of Health (SDOH) Interventions SDOH Screenings   Food Insecurity: Food Insecurity Present (03/06/2024)  Housing: High Risk (03/06/2024)  Transportation Needs: No Transportation Needs (03/06/2024)  Utilities: Patient Declined (03/06/2024)  Alcohol Screen: Medium Risk (06/24/2019)  Depression (PHQ2-9): Low Risk  (02/18/2024)  Recent Concern: Depression (PHQ2-9) - Medium Risk (01/21/2024)  Financial Resource Strain: High Risk (07/24/2021)   Received from [facility]  Physical Activity: Inactive (02/18/2024)  Social Connections: Unknown (04/05/2022)   Received from [facility]  Stress: Stress Concern Present (07/24/2021)   Received from [facility]  Tobacco Use: High Risk (03/04/2024)  Health Literacy: Adequate Health Literacy (02/18/2024)    Readmission Risk Interventions    12/21/2023    9:25 AM 12/09/2022    9:02 AM 12/01/2022    3:56 PM  Readmission Risk Prevention Plan  Transportation Screening Complete  Complete Complete  HRI or Home Care Consult Complete    Social Work Consult for Recovery Care Planning/Counseling Complete    Palliative Care Screening Not Applicable    Medication Review Oceanographer) Complete Complete Complete  PCP or Specialist appointment within 3-5 days of discharge  Complete Complete  HRI or Home Care Consult  Complete Complete  SW Recovery Care/Counseling Consult  Complete Complete  Palliative Care Screening  Not Applicable Not Applicable  Skilled Nursing Facility  Not Applicable Not Applicable  SNF Comments   n/a

## 2024-05-20 NOTE — TOC Progression Note (Signed)
 Transition of Care [facility]) - Progression Note    Patient Details  Name: [name] MRN: [id number] Date of Birth: 23-Dec-1965  Transition of Care [facility]) CM/SW Contact  Tandy Fam, [name] Phone Number: 05/20/2024, 3:56 PM  Clinical Narrative:   CSW met with Bernabe Brew Daughtry from [place] office (patient's payee) with paperwork regarding patient's disability being up for renewal. Patient refused to sign the document with Bernabe Brew. There is documentation for completion regarding patient's medical condition for the hospital to complete and send back. CSW to review and assist with paperwork.    Expected Discharge Plan: Skilled Nursing Facility Barriers to Discharge: English as a second language teacher, Active Substance Use - Placement, SNF Pending bed offer  Expected Discharge Plan and Services       Living arrangements for the past 2 months: Apartment Expected Discharge Date: 03/14/24                                     Social Determinants of Health (SDOH) Interventions SDOH Screenings   Food Insecurity: Food Insecurity Present (03/06/2024)  Housing: High Risk (03/06/2024)  Transportation Needs: No Transportation Needs (03/06/2024)  Utilities: Patient Declined (03/06/2024)  Alcohol Screen: Medium Risk (06/24/2019)  Depression (PHQ2-9): Low Risk  (02/18/2024)  Recent Concern: Depression (PHQ2-9) - Medium Risk (01/21/2024)  Financial Resource Strain: High Risk (07/24/2021)   Received from [facility]  Physical Activity: Inactive (02/18/2024)  Social Connections: Unknown (04/05/2022)   Received from [facility]  Stress: Stress Concern Present (07/24/2021)   Received from [facility]  Tobacco Use: High Risk (03/04/2024)  Health Literacy: Adequate Health Literacy (02/18/2024)    Readmission Risk Interventions    12/21/2023    9:25 AM 12/09/2022    9:02 AM 12/01/2022    3:56 PM  Readmission Risk Prevention Plan  Transportation Screening Complete Complete Complete  HRI or Home Care  Consult Complete    Social Work Consult for Recovery Care Planning/Counseling Complete    Palliative Care Screening Not Applicable    Medication Review Oceanographer) Complete Complete Complete  PCP or Specialist appointment within 3-5 days of discharge  Complete Complete  HRI or Home Care Consult  Complete Complete  SW Recovery Care/Counseling Consult  Complete Complete  Palliative Care Screening  Not Applicable Not Applicable  Skilled Nursing Facility  Not Applicable Not Applicable  SNF Comments   n/a

## 2024-05-20 NOTE — TOC Progression Note (Signed)
 Transition of Care [facility]) - Progression Note    Patient Details  Name: [name] MRN: [id number] Date of Birth: July 05, 1966  Transition of Care [facility]) CM/SW Contact  Tandy Fam, [name] Phone Number: 05/20/2024, 4:00 PM  Clinical Narrative:   CSW received call back from Kathaleen Pale with DSS that they are unable to locate any information on an interim guardian for patient, since DSS signed off. Joe attempting to reach [place] with APS that had worked up the case, but he is unable to get Josefina Nian on the phone to discuss. Joe suggested waiting until guardianship hearing, if able, for the guardian to be assigned to sign the paperwork for the patient. CSW checked patient's disability paperwork and due date is on May 30, 2024, which will be after court date. CSW to follow.    Expected Discharge Plan: Skilled Nursing Facility Barriers to Discharge: English as a second language teacher, Active Substance Use - Placement, SNF Pending bed offer  Expected Discharge Plan and Services       Living arrangements for the past 2 months: Apartment Expected Discharge Date: 03/14/24                                     Social Determinants of Health (SDOH) Interventions SDOH Screenings   Food Insecurity: Food Insecurity Present (03/06/2024)  Housing: High Risk (03/06/2024)  Transportation Needs: No Transportation Needs (03/06/2024)  Utilities: Patient Declined (03/06/2024)  Alcohol Screen: Medium Risk (06/24/2019)  Depression (PHQ2-9): Low Risk  (02/18/2024)  Recent Concern: Depression (PHQ2-9) - Medium Risk (01/21/2024)  Financial Resource Strain: High Risk (07/24/2021)   Received from [facility]  Physical Activity: Inactive (02/18/2024)  Social Connections: Unknown (04/05/2022)   Received from [facility]  Stress: Stress Concern Present (07/24/2021)   Received from [facility]  Tobacco Use: High Risk (03/04/2024)  Health Literacy: Adequate Health Literacy (02/18/2024)    Readmission Risk  Interventions    12/21/2023    9:25 AM 12/09/2022    9:02 AM 12/01/2022    3:56 PM  Readmission Risk Prevention Plan  Transportation Screening Complete Complete Complete  HRI or Home Care Consult Complete    Social Work Consult for Recovery Care Planning/Counseling Complete    Palliative Care Screening Not Applicable    Medication Review Oceanographer) Complete Complete Complete  PCP or Specialist appointment within 3-5 days of discharge  Complete Complete  HRI or Home Care Consult  Complete Complete  SW Recovery Care/Counseling Consult  Complete Complete  Palliative Care Screening  Not Applicable Not Applicable  Skilled Nursing Facility  Not Applicable Not Applicable  SNF Comments   n/a

## 2024-05-21 DIAGNOSIS — I639 Cerebral infarction, unspecified: Secondary | ICD-10-CM | POA: Diagnosis not present

## 2024-05-21 DIAGNOSIS — Z789 Other specified health status: Secondary | ICD-10-CM | POA: Diagnosis not present

## 2024-05-21 NOTE — Progress Notes (Signed)
 Progress Note    [name]   FMW:[id number]  DOB: [birth year]  DOA: 03/04/2024     80 PCP: Tanda Bleacher, MD  Initial CC: Slurred speech, confusion  Hospital Course: 58 year old with history significant for COPD, cocaine abuse, presented to the ED 03/04/24 for slower speech, confusion and right-sided weakness, CT head showed left MCA infarct, CTA head and neck revealed an M2 occlusion on the left,   Echocardiogram revealed large mobile left ventricular thrombus on the left and right ventricular apical thrombus in the right, ejection fraction less than 20%.  Cardiology was consulted recommended DOAC.  PT evaluated patient and recommended skilled nursing facility placement.  Patient was found to lack medical decision capacity/status and not amenable to determining his discharge disposition or leave AGAINST MEDICAL ADVICE.   He intermittently declines physical exams and medications. TOC is pursuing guardianship with APS as he is lacking medical decision capacity. Psychiatry presently recommending to await APS decision for guardianship before discharging home.  Patient does not qualify for forced medication administration   No changes in medical condition.   Assessment & Plan:  Principal Problem:   Acute CVA (cerebrovascular accident) (HCC) Active Problems:   LV (left ventricular) mural thrombus   Chronic systolic CHF (congestive heart failure) (HCC)   Prolonged QT interval   Elevated liver enzymes   COPD (chronic obstructive pulmonary disease) (HCC)   Cocaine abuse (HCC)   Schizoaffective disorder, bipolar type (HCC)   Benign essential HTN   Acid reflux   History of DVT (deep vein thrombosis)   Type 2 diabetes mellitus (HCC)   Status post bilateral BKA.   Dilated cardiomyopathy (HCC)    Acute CVA (cerebrovascular accident) (HCC) -He underwent extensive workup and imaging revealed left MCA territory infarct, caudate nucleus, lentiform nucleus and internal capsule MRI  confirmed. -TEE was noted for large mobile left lower clot just under the mitral valve with an EF of 20% and a small right apical clot with reduced RV systolic function. -He was started on Eliquis  for intracardiac thrombus, however he remained noncompliant.  In order to improve compliance, medicine changed to Xarelto  for once daily dosing -Psychiatry has deemed the patient to lack capacity; he continues to remain noncompliant at times -Unable to force meds.  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  Eliquis  changed to Xarelto , Coreg  long-acting.   Schizoaffective disorder bipolar disorder lacks medical capacity: Psych was consulted and recommended that the patient lacks medical capacity to make decisions if the patient attempts to leave AGAINST MEDICAL ADVICE he should be involuntarily committed. Psych recommending APS guardianship, TOC in touch with APS regarding this as patient lacks medical capacity   Essential hypertension:  Pressures are labile.  Currently on Coreg , Lasix , losartan , Aldactone .  Intermittent noncompliance  Hyperlipidemia: Continue Crestor .   Diabetes mellitus type 2:  last A1c of 5.4. refuses monitoring    Polysubstance abuse: UDS was positive for cocaine and benzos.   Deconditioning: Ambulatory status complicated by previous right AKA and left BKA has no insurance coverage for SNF. - refusing therapy at times   History of COPD: Continue inhalers   History of DVT: On Xarelto  but frequently refuses   Estimated body mass index is 20.09 kg/m as calculated from the following:   Height as of this encounter: 5' 9 (1.753 m).  Weight as of this encounter: 61.7 kg   Interval History:  No events overnight.  Awake and sitting on side of bed  today.  Looks like disability paperwork is due on 05/30/2024 which is potentially after any potential court date that would assign guardianship.  Old records reviewed in assessment of this patient  Antimicrobials:   DVT prophylaxis:   rivaroxaban  (XARELTO ) tablet 20 mg   Code Status:   Code Status: Full Code  Mobility Assessment (Last 72 Hours)     Mobility Assessment     Row Name 05/20/24 2100 05/20/24 2015 05/20/24 0800 05/19/24 0800 05/19/24 0542   Does patient have an order for bedrest or is patient medically unstable No - Continue assessment No - Continue assessment No - Continue assessment No - Continue assessment No - Continue assessment   What is the highest level of mobility based on the progressive mobility assessment? Level 2 (Chairfast) - Balance while sitting on edge of bed and cannot stand Level 2 (Chairfast) - Balance while sitting on edge of bed and cannot stand Level 2 (Chairfast) - Balance while sitting on edge of bed and cannot stand Level 2 (Chairfast) - Balance while sitting on edge of bed and cannot stand Level 2 (Chairfast) - Balance while sitting on edge of bed and cannot stand   Is the above level different from baseline mobility prior to current illness? -- Yes - Recommend PT order Yes - Recommend PT order Yes - Recommend PT order Yes - Recommend PT order    Row Name 05/18/24 2010 05/18/24 1709         Does patient have an order for bedrest or is patient medically unstable No - Continue assessment --      What is the highest level of mobility based on the progressive mobility assessment? Level 2 (Chairfast) - Balance while sitting on edge of bed and cannot stand Level 2 (Chairfast) - Balance while sitting on edge of bed and cannot stand      Is the above level different from baseline mobility prior to current illness? Yes - Recommend PT order --         Barriers to discharge: APS to obtain guardianship Disposition Plan:  TBD HH orders placed: n/a Status  is: Inpt  Objective: Blood pressure (!) 154/109, pulse 76, temperature 98 F (36.7 C), resp. rate 17, height 5' 9 (1.753 m), weight 61.7 kg, SpO2 95%.  Examination:  Physical Exam Constitutional:      General: He is not in acute distress.    Appearance: Normal appearance.     Comments: Patient not willing to allow full exam  HENT:     Head: Normocephalic and atraumatic.     Mouth/Throat:     Mouth: Mucous membranes are moist.   Eyes:     Extraocular Movements: Extraocular movements intact.   Abdominal:     General: Bowel sounds are normal. There is no distension.     Palpations: Abdomen is soft.   Musculoskeletal:     Cervical back: Normal range of motion and neck supple.     Comments: Lower extremity amputations noted   Skin:    General: Skin is warm and dry.   Neurological:     Mental Status: He is alert and oriented to person, place, and time.      Consultants:  Psych  Procedures:    Data Reviewed: No results found for this or any previous visit (from the past 24 hours).  I have  reviewed pertinent nursing notes, vitals, labs, and images as necessary. I have ordered labwork to follow up on as indicated.  I have reviewed the last notes from staff over past 24 hours. I have discussed patient's care plan and test results with nursing staff, CM/SW, and other staff as appropriate.     LOS: 78 days   Alm Apo, MD [facility] 05/21/2024, 1:59 PM

## 2024-05-22 DIAGNOSIS — I639 Cerebral infarction, unspecified: Secondary | ICD-10-CM | POA: Diagnosis not present

## 2024-05-22 DIAGNOSIS — Z789 Other specified health status: Secondary | ICD-10-CM | POA: Diagnosis not present

## 2024-05-22 NOTE — Progress Notes (Signed)
 Progress Note    Janelle Culton   FMW:[id number]  DOB: [birth year]  DOA: 03/04/2024     81 PCP: Tanda Bleacher, MD  Initial CC: Slurred speech, confusion  Hospital Course: 58 year old with history significant for COPD, cocaine abuse, presented to the ED 03/04/24 for slower speech, confusion and right-sided weakness, CT head showed left MCA infarct, CTA head and neck revealed an M2 occlusion on the left,   Echocardiogram revealed large mobile left ventricular thrombus on the left and right ventricular apical thrombus in the right, ejection fraction less than 20%.  Cardiology was consulted recommended DOAC.  PT evaluated patient and recommended skilled nursing facility placement.  Patient was found to lack medical decision capacity/status and not amenable to determining his discharge disposition or leave AGAINST MEDICAL ADVICE.   He intermittently declines physical exams and medications. TOC is pursuing guardianship with APS as he is lacking medical decision capacity. Psychiatry presently recommending to await APS decision for guardianship before discharging home.  Patient does not qualify for forced medication administration   No changes in medical condition.   Assessment & Plan:  Principal Problem:   Acute CVA (cerebrovascular accident) (HCC) Active Problems:   LV (left ventricular) mural thrombus   Chronic systolic CHF (congestive heart failure) (HCC)   Prolonged QT interval   Elevated liver enzymes   COPD (chronic obstructive pulmonary disease) (HCC)   Cocaine abuse (HCC)   Schizoaffective disorder, bipolar type (HCC)   Benign essential HTN   Acid reflux   History of DVT (deep vein thrombosis)   Type 2 diabetes mellitus (HCC)   Status post bilateral BKA.   Dilated cardiomyopathy (HCC)    Acute CVA (cerebrovascular accident) (HCC) -He underwent extensive workup and imaging revealed left MCA territory infarct, caudate nucleus, lentiform nucleus and internal capsule MRI  confirmed. -TEE was noted for large mobile left lower clot just under the mitral valve with an EF of 20% and a small right apical clot with reduced RV systolic function. -He was started on Eliquis  for intracardiac thrombus, however he remained noncompliant.  In order to improve compliance, medicine changed to Xarelto  for once daily dosing -Psychiatry has deemed the patient to lack capacity; he continues to remain noncompliant at times -Unable to force meds.  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  Eliquis  changed to Xarelto , Coreg  long-acting.   Schizoaffective disorder bipolar disorder lacks medical capacity: Psych was consulted and recommended that the patient lacks medical capacity to make decisions if the patient attempts to leave AGAINST MEDICAL ADVICE he should be involuntarily committed. Psych recommending APS guardianship, TOC in touch with APS regarding this as patient lacks medical capacity   Essential hypertension:  Pressures are labile.  Currently on Coreg , Lasix , losartan , Aldactone .  Intermittent noncompliance  Hyperlipidemia: Continue Crestor .   Diabetes mellitus type 2:  last A1c of 5.4. refuses monitoring    Polysubstance abuse: UDS was positive for cocaine and benzos.   Deconditioning: Ambulatory status complicated by previous right AKA and left BKA has no insurance coverage for SNF. - refusing therapy at times   History of COPD: Continue inhalers   History of DVT: On Xarelto  but frequently refuses   Estimated body mass index is 20.09 kg/m as calculated from the following:   Height as of this encounter: 5' 9 (1.753 m).  Weight as of this encounter: 61.7 kg   Interval History:  No events overnight.  Awake and sitting on side of bed  today.  Looks like disability paperwork is due on 05/30/2024 which is potentially after any potential court date that would assign guardianship.  Old records reviewed in assessment of this patient  Antimicrobials:   DVT prophylaxis:   rivaroxaban  (XARELTO ) tablet 20 mg   Code Status:   Code Status: Full Code  Mobility Assessment (Last 72 Hours)     Mobility Assessment     Row Name 05/21/24 2000 05/20/24 2100 05/20/24 2015 05/20/24 0800     Does patient have an order for bedrest or is patient medically unstable No - Continue assessment No - Continue assessment No - Continue assessment No - Continue assessment    What is the highest level of mobility based on the progressive mobility assessment? Level 2 (Chairfast) - Balance while sitting on edge of bed and cannot stand Level 2 (Chairfast) - Balance while sitting on edge of bed and cannot stand Level 2 (Chairfast) - Balance while sitting on edge of bed and cannot stand Level 2 (Chairfast) - Balance while sitting on edge of bed and cannot stand    Is the above level different from baseline mobility prior to current illness? Yes - Recommend PT order -- Yes - Recommend PT order Yes - Recommend PT order       Barriers to discharge: APS to obtain guardianship Disposition Plan:  TBD HH orders placed: n/a Status is: Inpt  Objective: Blood pressure 134/84, pulse 73, temperature 98 F (36.7 C), resp. rate 18, height 5' 9 (1.753 m), weight 61.7 kg, SpO2 99%.  Examination:  Physical Exam Constitutional:      General: He is not in acute distress.    Appearance: Normal appearance.     Comments: Patient not willing to allow full exam  HENT:     Head: Normocephalic and atraumatic.     Mouth/Throat:     Mouth: Mucous membranes are moist.   Eyes:     Extraocular Movements: Extraocular movements intact.   Abdominal:     General: Bowel sounds are normal. There is no distension.     Palpations: Abdomen is soft.   Musculoskeletal:      Cervical back: Normal range of motion and neck supple.     Comments: Lower extremity amputations noted   Skin:    General: Skin is warm and dry.   Neurological:     Mental Status: He is alert and oriented to person, place, and time.      Consultants:  Psych  Procedures:    Data Reviewed: No results found for this or any previous visit (from the past 24 hours).  I have reviewed pertinent nursing notes, vitals, labs, and images as necessary. I have ordered labwork to follow up on as indicated.  I have reviewed the last notes from staff over past 24 hours. I have discussed patient's care plan and test results with nursing staff, CM/SW, and other staff as appropriate.     LOS: 79 days   Alm Apo, MD [facility] 05/22/2024, 12:47 PM

## 2024-05-23 DIAGNOSIS — Z789 Other specified health status: Secondary | ICD-10-CM | POA: Diagnosis not present

## 2024-05-23 DIAGNOSIS — I639 Cerebral infarction, unspecified: Secondary | ICD-10-CM | POA: Diagnosis not present

## 2024-05-23 NOTE — Progress Notes (Signed)
 Progress Note    [name]   FMW:[id number]  DOB: 08-10-1966  DOA: 03/04/2024     82 PCP: Tanda Bleacher, MD  Initial CC: Slurred speech, confusion  Hospital Course: 58 year old with history significant for COPD, cocaine abuse, presented to the ED 03/04/24 for slower speech, confusion and right-sided weakness, CT head showed left MCA infarct, CTA head and neck revealed an M2 occlusion on the left,   Echocardiogram revealed large mobile left ventricular thrombus on the left and right ventricular apical thrombus in the right, ejection fraction less than 20%.  Cardiology was consulted recommended DOAC.  PT evaluated patient and recommended skilled nursing facility placement.  Patient was found to lack medical decision capacity/status and not amenable to determining his discharge disposition or leave AGAINST MEDICAL ADVICE.   He intermittently declines physical exams and medications. TOC is pursuing guardianship with APS as he is lacking medical decision capacity. Psychiatry presently recommending to await APS decision for guardianship before discharging home.  Patient does not qualify for forced medication administration   No changes in medical condition.   Assessment & Plan:  Principal Problem:   Acute CVA (cerebrovascular accident) (HCC) Active Problems:   LV (left ventricular) mural thrombus   Chronic systolic CHF (congestive heart failure) (HCC)   Prolonged QT interval   Elevated liver enzymes   COPD (chronic obstructive pulmonary disease) (HCC)   Cocaine abuse (HCC)   Schizoaffective disorder, bipolar type (HCC)   Benign essential HTN   Acid reflux   History of DVT (deep vein thrombosis)   Type 2 diabetes mellitus (HCC)   Status post bilateral BKA.   Dilated cardiomyopathy (HCC)    Acute CVA (cerebrovascular accident) (HCC) -He underwent extensive workup and imaging revealed left MCA territory infarct, caudate nucleus, lentiform nucleus and internal capsule MRI  confirmed. -TEE was noted for large mobile left lower clot just under the mitral valve with an EF of 20% and a small right apical clot with reduced RV systolic function. -He was started on Eliquis  for intracardiac thrombus, however he remained noncompliant.  In order to improve compliance, medicine changed to Xarelto  for once daily dosing -Psychiatry has deemed the patient to lack capacity; he continues to remain noncompliant at times -Unable to force meds.  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  Eliquis  changed to Xarelto , Coreg  long-acting.   Schizoaffective disorder bipolar disorder lacks medical capacity: Psych was consulted and recommended that the patient lacks medical capacity to make decisions if the patient attempts to leave AGAINST MEDICAL ADVICE he should be involuntarily committed. Psych recommending APS guardianship, TOC in touch with APS regarding this as patient lacks medical capacity   Essential hypertension:  Pressures are labile.  Currently on Coreg , Lasix , losartan , Aldactone .  Intermittent noncompliance  Hyperlipidemia: Continue Crestor .   Diabetes mellitus type 2:  last A1c of 5.4. refuses monitoring    Polysubstance abuse: UDS was positive for cocaine and benzos.   Deconditioning: Ambulatory status complicated by previous right AKA and left BKA has no insurance coverage for SNF. - refusing therapy at times   History of COPD: Continue inhalers   History of DVT: On Xarelto  but frequently refuses   Estimated body mass index is 20.09 kg/m as calculated from the following:   Height as of this encounter: 5' 9 (1.753 m).  Weight as of this encounter: 61.7 kg   Interval History:  Rolling around in hallway in wheelchair this morning.   Doing okay otherwise.  Antimicrobials:   DVT prophylaxis:   rivaroxaban  (XARELTO ) tablet 20 mg   Code Status:   Code Status: Full Code  Mobility Assessment (Last 72 Hours)     Mobility Assessment     Row Name 05/22/24 2000 05/21/24 2000 05/20/24 2100 05/20/24 2015     Does patient have an order for bedrest or is patient medically unstable No - Continue assessment No - Continue assessment No - Continue assessment No - Continue assessment    What is the highest level of mobility based on the progressive mobility assessment? Level 2 (Chairfast) - Balance while sitting on edge of bed and cannot stand Level 2 (Chairfast) - Balance while sitting on edge of bed and cannot stand Level 2 (Chairfast) - Balance while sitting on edge of bed and cannot stand Level 2 (Chairfast) - Balance while sitting on edge of bed and cannot stand    Is the above level different from baseline mobility prior to current illness? Yes - Recommend PT order Yes - Recommend PT order -- Yes - Recommend PT order       Barriers to discharge: APS to obtain guardianship Disposition Plan:  TBD HH orders placed: n/a Status is: Inpt  Objective: Blood pressure (!) 134/97, pulse 87, temperature 97.8 F (36.6 C), resp. rate 16, height 5' 9 (1.753 m), weight 61.7 kg, SpO2 92%.  Examination:  Physical Exam Constitutional:      General: He is not in acute distress.    Appearance: Normal appearance.  HENT:     Head: Normocephalic and atraumatic.     Mouth/Throat:     Mouth: Mucous membranes are moist.   Eyes:     Extraocular Movements: Extraocular movements intact.   Abdominal:     General: Bowel sounds are normal. There is no distension.     Palpations: Abdomen is soft.   Musculoskeletal:     Cervical back: Normal range of motion and neck supple.     Comments: Lower extremity amputations noted   Skin:    General: Skin is warm and dry.   Neurological:     Mental Status: He is alert and oriented to person,  place, and time.      Consultants:  Psych  Procedures:    Data Reviewed: No results found for this or any previous visit (from the past 24 hours).  I have reviewed pertinent nursing notes, vitals, labs, and images as necessary. I have ordered labwork to follow up on as indicated.  I have reviewed the last notes from staff over past 24 hours. I have discussed patient's care plan and test results with nursing staff, CM/SW, and other staff as appropriate.     LOS: 80 days   Alm Apo, MD [facility] 05/23/2024, 1:40 PM

## 2024-05-23 NOTE — Progress Notes (Signed)
 Patient verbalizes that he will let RN know whenever he is ready for his nightly medications. Patient appeared alert and awake in bed, able to make his needs known. No other distress  noted or any concern voiced from patient. Will continue nursing round and monitor.

## 2024-05-23 NOTE — TOC Progression Note (Signed)
 Transition of Care [facility]) - Progression Note    Patient Details  Name: [name] MRN: [id number] Date of Birth: Aug 15, 1966  Transition of Care [facility]) CM/SW Contact  Luise JAYSON Pan, [place] Phone Number: 05/23/2024, 10:47 AM  Clinical Narrative:   Received call from [place] home about disability housing application, asked for Vertell, CSW to call back at [phone].  TOC will continue to follow.      Expected Discharge Plan: Skilled Nursing Facility Barriers to Discharge: English as a second language teacher, Active Substance Use - Placement, SNF Pending bed offer  Expected Discharge Plan and Services       Living arrangements for the past 2 months: Apartment Expected Discharge Date: 03/14/24                                     Social Determinants of Health (SDOH) Interventions SDOH Screenings   Food Insecurity: Food Insecurity Present (03/06/2024)  Housing: High Risk (03/06/2024)  Transportation Needs: No Transportation Needs (03/06/2024)  Utilities: Patient Declined (03/06/2024)  Alcohol Screen: Medium Risk (06/24/2019)  Depression (PHQ2-9): Low Risk  (02/18/2024)  Recent Concern: Depression (PHQ2-9) - Medium Risk (01/21/2024)  Financial Resource Strain: High Risk (07/24/2021)   Received from [facility]  Physical Activity: Inactive (02/18/2024)  Social Connections: Unknown (04/05/2022)   Received from [facility]  Stress: Stress Concern Present (07/24/2021)   Received from [facility]  Tobacco Use: High Risk (03/04/2024)  Health Literacy: Adequate Health Literacy (02/18/2024)    Readmission Risk Interventions    12/21/2023    9:25 AM 12/09/2022    9:02 AM 12/01/2022    3:56 PM  Readmission Risk Prevention Plan  Transportation Screening Complete Complete Complete  HRI or Home Care Consult Complete    Social Work Consult for Recovery Care Planning/Counseling Complete    Palliative Care Screening Not Applicable    Medication Review Oceanographer) Complete Complete Complete  PCP  or Specialist appointment within 3-5 days of discharge  Complete Complete  HRI or Home Care Consult  Complete Complete  SW Recovery Care/Counseling Consult  Complete Complete  Palliative Care Screening  Not Applicable Not Applicable  Skilled Nursing Facility  Not Applicable Not Applicable  SNF Comments   n/a

## 2024-05-23 NOTE — Progress Notes (Signed)
 Physical Therapy Treatment Patient Details Name: [name] MRN: [id number] DOB: Aug 31, 1966 Today's Date: 05/23/2024   History of Present Illness Patient is 58 y.o. male presented to ED on 03/04/24 with stroke like symptoms when caregiver found him with slurred speech and right sided weakness. Head CT reveals Acute left MCA territory infarct affecting the insula, caudate nucleus, lentiform nucleus and internal capsule. PMH significant of  Schizoaffective disorder, heart failure with reduced ejection fraction, pretension, DMII, COPD, s/p bilateral amputations, chronic pain, and polysubstance abuse.    PT Comments  Continuing work on functional mobility and activity tolerance; [name] has his own wheelchair and his prosthesis now!  He clearly is more comfortable in his own wheelchair, and and is already spending much more time OOB; he describes to me his attempt at getting to the toilet in the bathroom; he tells me he needed assistance because the toilet was lower than his wheelchair; worth considering, getting a drop arm bedside commode to go over toilet in his room;  I turned the conversation to perhaps, using his prosthesis to help with boosting, or as a pivot point for transfers; Anjelo was not interested in trying to don his prosthesis today, in fact, he states he would like to wait until he gets a prosthesis on his right leg before working with his left prosthesis; (his right leg amputation is a knee disarticulation, and I would anticipate getting a prosthesis for the right leg would be more complicated); will gently work to [employer] on the benefits of using his left processes here, now, to help with more complicated transfers, with various surface, heights, and various angles -- to help [name] get closer to his goal of getting out of the hospital;   Goals updated -- attempted to get more solid goals that were meaningful for [name]; he understandably has his mind on upcoming decisions to be made    If plan is discharge home, recommend the following: Supervision due to cognitive status;Direct supervision/assist for financial management;Direct supervision/assist for medications management   Can travel by private vehicle      (perhaps soon)  Equipment Recommendations  None recommended by PT    Recommendations for Other Services OT consult;Speech consult     Precautions / Restrictions Precautions Precautions: Fall Recall of Precautions/Restrictions: Impaired Precaution/Restrictions Comments: h/o BLE amputations     Nurse, children's mobility: Yes Wheelchair propulsion: Both upper extremities Wheelchair parts: Independent Distance:  (maneuvering in and around his room) Wheelchair Assistance Details (indicate cue type and reason): Most notable that with his own wheelchair he naturally repositions himself, and performs small chair push ups   Tilt Bed    Modified Rankin (Stroke Patients Only) Modified Rankin (Stroke Patients Only) Pre-Morbid Rankin Score: Slight disability Modified Rankin: Moderate disability     Balance                                            Communication Communication Communication: Impaired Factors Affecting Communication: Difficulty expressing self;Reduced clarity of speech (But seems to be improving)  Cognition Arousal: Alert Behavior During Therapy: WFL for tasks assessed/performed   PT - Cognitive impairments: Problem solving                       PT - Cognition Comments: Does not want to check his prosthesis fit, or practice  transfers with prosthesis today; his rationale is that he wants to wait until he gets a R prosthesis to put on LLE prosthesis; this PT does not know if/when there is a plan for R prosthesis Following commands: Intact Following commands impaired: Follows one step commands inconsistently, Follows one step commands with increased time (Responds  better when commands are presented more like invitations)    Cueing    Exercises      General Comments General comments (skin integrity, edema, etc.): Anticipate the need to gently and slowly ease into transfer training with his prosthesis      Pertinent Vitals/Pain Pain Assessment Pain Assessment: No/denies pain Pain Intervention(s): Monitored during session    Home Living                          Prior Function            PT Goals (current goals can now be found in the care plan section) Acute Rehab PT Goals Patient Stated Goal: would like to be able to get home PT Goal Formulation: With patient Time For Goal Achievement: 06/06/24 Potential to Achieve Goals: Fair Additional Goals Additional Goal #1: Pt will perform a floor to wheelchair transfer with min assist with or without prosthesis Additional Goal #2: Pt will show understanding of the need to be able to transfer surfaces of varying heights at varying angles (with or without his prosthesis) to be able to manage safe mobiltiy in a variety of environments Progress towards PT goals: Progressing toward goals (though goals need a closer look to get them in line with what Berlie wants)    Frequency    Min 2X/week      PT Plan      Co-evaluation              AM-PAC PT 6 Clicks Mobility   Outcome Measure  Help needed turning from your back to your side while in a flat bed without using bedrails?: None Help needed moving from lying on your back to sitting on the side of a flat bed without using bedrails?: None Help needed moving to and from a bed to a chair (including a wheelchair)?: A Little Help needed standing up from a chair using your arms (e.g., wheelchair or bedside chair)?: Total Help needed to walk in hospital room?: Total Help needed climbing 3-5 steps with a railing? : Total 6 Click Score: 14    End of Session   Activity Tolerance: Patient tolerated treatment well Patient left: with  call bell/phone within reach (in his wheelchair in his room) Nurse Communication: Mobility status PT Visit Diagnosis: Other abnormalities of gait and mobility (R26.89);Muscle weakness (generalized) (M62.81);Other symptoms and signs involving the nervous system (R29.898);Hemiplegia and hemiparesis Hemiplegia - Right/Left: Right Hemiplegia - dominant/non-dominant: Dominant Hemiplegia - caused by: Cerebral infarction     Time: 1320-1350 PT Time Calculation (min) (ACUTE ONLY): 30 min  Charges:    $Therapeutic Activity: 8-22 mins $Self Care/Home Management: 8-22 PT General Charges $$ ACUTE PT VISIT: 1 Visit                     [name], PT  Acute Rehabilitation Services Office [phone] Secure Chat welcomed    [name] 05/23/2024, 4:16 PM

## 2024-05-24 DIAGNOSIS — I639 Cerebral infarction, unspecified: Secondary | ICD-10-CM | POA: Diagnosis not present

## 2024-05-24 DIAGNOSIS — Z789 Other specified health status: Secondary | ICD-10-CM | POA: Diagnosis not present

## 2024-05-24 MED ORDER — LABETALOL HCL 5 MG/ML IV SOLN
20.0000 mg | INTRAVENOUS | Status: DC | PRN
  Administered 2024-05-24: 20 mg via INTRAVENOUS
  Filled 2024-05-24: qty 4

## 2024-05-24 NOTE — Progress Notes (Signed)
 MEWS Progress Note  Patient Details Name: [name] MRN: [id number] DOB: [birth year] Today's Date: 05/24/2024   MEWS Flowsheet Documentation:  Assess: MEWS Score Temp:  (paient refuse) BP: (!) 172/108 MAP (mmHg): 126 Pulse Rate: (!) 117 ECG Heart Rate: (!) 117 Resp: 18 Level of Consciousness: Alert SpO2: 94 % O2 Device: Room Air Patient Activity (if Appropriate): In bed O2 Flow Rate (L/min): 2 L/min FiO2 (%): 28 % Assess: MEWS Score MEWS Temp: 0 MEWS Systolic: 0 MEWS Pulse: 2 MEWS RR: 0 MEWS LOC: 0 MEWS Score: 2 MEWS Score Color: Yellow Assess: SIRS CRITERIA SIRS Temperature : 0 SIRS Respirations : 0 SIRS Pulse: 1 SIRS WBC: 0 SIRS Score Sum : 1 SIRS Temperature : 0 SIRS Pulse: 0 SIRS Respirations : 0 SIRS WBC: 0 SIRS Score Sum : 0 Assess: if the MEWS score is Yellow or Red Were vital signs accurate and taken at a resting state?: Yes Does the patient meet 2 or more of the SIRS criteria?: No MEWS guidelines implemented : Yes, yellow Treat MEWS Interventions: Considered administering scheduled or prn medications/treatments as ordered Take Vital Signs Increase Vital Sign Frequency : Yellow: Q2hr x1, continue Q4hrs until patient remains green for 12hrs Escalate MEWS: Escalate: Yellow: Discuss with charge nurse and consider notifying provider and/or RRT Provider Notification Provider Name/Title: DOROTHA Peaches, MD Date Provider Notified: 05/24/24 Time Provider Notified: 2207 Method of Notification: Page Notification Reason: Other (Comment) (BP 172/108 with a MAP of 126)      [name] [name] [name] 05/24/2024, 10:07 PM

## 2024-05-24 NOTE — Plan of Care (Signed)
  Problem: Coping: Goal: Level of anxiety will decrease Outcome: Not Progressing   

## 2024-05-24 NOTE — Plan of Care (Signed)
 Has had a great day today.  Took all of his meds and was cooperative throughout the day.  Did not refuse any care.  Has been much happier since got his new w/c.    Problem: Coping: Goal: Level of anxiety will decrease Outcome: Progressing   Problem: Pain Managment: Goal: General experience of comfort will improve and/or be controlled Outcome: Progressing   Problem: Safety: Goal: Ability to remain free from injury will improve Outcome: Progressing   Problem: Skin Integrity: Goal: Risk for impaired skin integrity will decrease Outcome: Progressing

## 2024-05-24 NOTE — Progress Notes (Signed)
 Progress Note    [name]   FMW:[id number]  DOB: [birth year]  DOA: 03/04/2024     83 PCP: Tanda Bleacher, MD  Initial CC: Slurred speech, confusion  Hospital Course: 58 year old with history significant for COPD, cocaine abuse, presented to the ED 03/04/24 for slower speech, confusion and right-sided weakness, CT head showed left MCA infarct, CTA head and neck revealed an M2 occlusion on the left,   Echocardiogram revealed large mobile left ventricular thrombus on the left and right ventricular apical thrombus in the right, ejection fraction less than 20%.  Cardiology was consulted recommended DOAC.  PT evaluated patient and recommended skilled nursing facility placement.  Patient was found to lack medical decision capacity/status and not amenable to determining his discharge disposition or leave AGAINST MEDICAL ADVICE.   He intermittently declines physical exams and medications. TOC is pursuing guardianship with APS as he is lacking medical decision capacity. Psychiatry presently recommending to await APS decision for guardianship before discharging home.  Patient does not qualify for forced medication administration   No changes in medical condition.   Assessment & Plan:  Principal Problem:   Acute CVA (cerebrovascular accident) (HCC) Active Problems:   LV (left ventricular) mural thrombus   Chronic systolic CHF (congestive heart failure) (HCC)   Prolonged QT interval   Elevated liver enzymes   COPD (chronic obstructive pulmonary disease) (HCC)   Cocaine abuse (HCC)   Schizoaffective disorder, bipolar type (HCC)   Benign essential HTN   Acid reflux   History of DVT (deep vein thrombosis)   Type 2 diabetes mellitus (HCC)   Status post bilateral BKA.   Dilated cardiomyopathy (HCC)    Acute CVA (cerebrovascular accident) (HCC) -He underwent extensive workup and imaging revealed left MCA territory infarct, caudate nucleus, lentiform nucleus and internal capsule MRI  confirmed. -TEE was noted for large mobile left lower clot just under the mitral valve with an EF of 20% and a small right apical clot with reduced RV systolic function. -He was started on Eliquis  for intracardiac thrombus, however he remained noncompliant.  In order to improve compliance, medicine changed to Xarelto  for once daily dosing -Psychiatry has deemed the patient to lack capacity; he continues to remain noncompliant at times -Unable to force meds.  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  Eliquis  changed to Xarelto , Coreg  long-acting.   Schizoaffective disorder bipolar disorder lacks medical capacity: Psych was consulted and recommended that the patient lacks medical capacity to make decisions if the patient attempts to leave AGAINST MEDICAL ADVICE he should be involuntarily committed. Psych recommending APS guardianship, TOC in touch with APS regarding this as patient lacks medical capacity   Essential hypertension:  Pressures are labile.  Currently on Coreg , Lasix , losartan , Aldactone .  Intermittent noncompliance  Hyperlipidemia: Continue Crestor .   Diabetes mellitus type 2:  last A1c of 5.4. refuses monitoring    Polysubstance abuse: UDS was positive for cocaine and benzos.   Deconditioning: Ambulatory status complicated by previous right AKA and left BKA has no insurance coverage for SNF. - refusing therapy at times   History of COPD: Continue inhalers   History of DVT: On Xarelto  but frequently refuses   Estimated body mass index is 20.09 kg/m as calculated from the following:   Height as of this encounter: 5' 9 (1.753 m).  Weight as of this encounter: 61.7 kg   Interval History:  Rolling around in hallway in wheelchair this morning.   Doing okay otherwise.  Antimicrobials:   DVT prophylaxis:   rivaroxaban  (XARELTO ) tablet 20 mg   Code Status:   Code Status: Full Code  Mobility Assessment (Last 72 Hours)     Mobility Assessment     Row Name 05/24/24 0800 05/24/24 0022 05/23/24 2112 05/23/24 1500 05/22/24 2000   Does patient have an order for bedrest or is patient medically unstable No - Continue assessment No - Continue assessment No - Continue assessment -- No - Continue assessment   What is the highest level of mobility based on the progressive mobility assessment? Level 2 (Chairfast) - Balance while sitting on edge of bed and cannot stand Level 2 (Chairfast) - Balance while sitting on edge of bed and cannot stand Level 2 (Chairfast) - Balance while sitting on edge of bed and cannot stand Level 2 (Chairfast) - Balance while sitting on edge of bed and cannot stand Level 2 (Chairfast) - Balance while sitting on edge of bed and cannot stand   Is the above level different from baseline mobility prior to current illness? Yes - Recommend PT order -- -- -- Yes - Recommend PT order    Row Name 05/21/24 2000           Does patient have an order for bedrest or is patient medically unstable No - Continue assessment       What is the highest level of mobility based on the progressive mobility assessment? Level 2 (Chairfast) - Balance while sitting on edge of bed and cannot stand       Is the above level different from baseline mobility prior to current illness? Yes - Recommend PT order          Barriers to discharge: APS to obtain guardianship Disposition Plan:  TBD HH orders placed: n/a Status is: Inpt  Objective: Blood pressure (!) 161/87, pulse 78, temperature 97.8 F (36.6 C), resp. rate 18, height 5' 9 (1.753 m), weight 61.7 kg, SpO2 96%.  Examination:  Physical Exam Constitutional:      General: He is not in acute distress.    Appearance: Normal appearance.  HENT:     Head: Normocephalic and atraumatic.      Mouth/Throat:     Mouth: Mucous membranes are moist.   Eyes:     Extraocular Movements: Extraocular movements intact.   Abdominal:     General: Bowel sounds are normal. There is no distension.     Palpations: Abdomen is soft.   Musculoskeletal:     Cervical back: Normal range of motion and neck supple.     Comments: Lower extremity amputations noted   Skin:    General: Skin is warm and dry.   Neurological:     Mental Status: He is alert and oriented to person, place, and time.      Consultants:  Psych  Procedures:    Data Reviewed: No results found for this or any previous visit (from the past 24 hours).  I have reviewed pertinent nursing notes, vitals, labs, and images as necessary. I have ordered labwork to follow up on as indicated.  I have reviewed the last notes from staff over past 24 hours. I have discussed patient's care plan and test results with nursing staff, CM/SW, and other staff as appropriate.     LOS: 81 days   Alm Apo, MD [facility] 05/24/2024,  2:06 PM

## 2024-05-25 DIAGNOSIS — I639 Cerebral infarction, unspecified: Secondary | ICD-10-CM | POA: Diagnosis not present

## 2024-05-25 NOTE — Plan of Care (Signed)
  Problem: Nutrition: Goal: Adequate nutrition will be maintained Outcome: Progressing   Problem: Coping: Goal: Level of anxiety will decrease Outcome: Progressing   Problem: Elimination: Goal: Will not experience complications related to bowel motility Outcome: Progressing Goal: Will not experience complications related to urinary retention Outcome: Progressing   Problem: Pain Managment: Goal: General experience of comfort will improve and/or be controlled Outcome: Progressing   Problem: Safety: Goal: Ability to remain free from injury will improve Outcome: Progressing   Problem: Skin Integrity: Goal: Risk for impaired skin integrity will decrease Outcome: Progressing

## 2024-05-25 NOTE — Progress Notes (Signed)
 Pt refused vs and lasix  and aldactone  today as usual.  He did take all other meds.  Mood has been much better and he has been more friendly with staff.

## 2024-05-25 NOTE — Progress Notes (Addendum)
 Chaplain followed up with pt. Pt looking forward to tomorrow's hearing, but seems to be preparing for the worst. Chaplain encouraged pt to stay calm during hearing, and that hopefully everything will go as he believes it should. Chaplain will continue to follow.  Court document states pt has memory loss from stroke, weak left arm, and diabetes. Pt denies all three, stating his left arm is in fact his strong arm.

## 2024-05-25 NOTE — Progress Notes (Signed)
 Progress Note    [name]   FMW:[id number]  DOB: Dec 28, 1965  DOA: 03/04/2024     84 PCP: Tanda Bleacher, MD  Initial CC: Slurred speech, confusion  Hospital Course: 58 year old with history significant for COPD, cocaine abuse, presented to the ED 03/04/24 for slower speech, confusion and right-sided weakness, CT head showed left MCA infarct, CTA head and neck revealed an M2 occlusion on the left,   Echocardiogram revealed large mobile left ventricular thrombus on the left and right ventricular apical thrombus in the right, ejection fraction less than 20%.  Cardiology was consulted recommended DOAC.  PT evaluated patient and recommended skilled nursing facility placement.  Patient was found to lack medical decision capacity/status and not amenable to determining his discharge disposition or leave AGAINST MEDICAL ADVICE.   He intermittently declines physical exams and medications. TOC is pursuing guardianship with APS as he is lacking medical decision capacity. Psychiatry presently recommending to await APS decision for guardianship before discharging home.  Patient does not qualify for forced medication administration   No changes in medical condition.   Assessment & Plan:  Principal Problem:   Acute CVA (cerebrovascular accident) (HCC) Active Problems:   LV (left ventricular) mural thrombus   Chronic systolic CHF (congestive heart failure) (HCC)   Prolonged QT interval   Elevated liver enzymes   COPD (chronic obstructive pulmonary disease) (HCC)   Cocaine abuse (HCC)   Schizoaffective disorder, bipolar type (HCC)   Benign essential HTN   Acid reflux   History of DVT (deep vein thrombosis)   Type 2 diabetes mellitus (HCC)   Status post bilateral BKA.   Dilated cardiomyopathy (HCC)    Acute CVA (cerebrovascular accident) (HCC) -He underwent extensive workup and imaging revealed left MCA territory infarct, caudate nucleus, lentiform nucleus and internal capsule MRI  confirmed. -TEE was noted for large mobile left lower clot just under the mitral valve with an EF of 20% and a small right apical clot with reduced RV systolic function. -He was started on Eliquis  for intracardiac thrombus, however he remained noncompliant.  In order to improve compliance, medicine changed to Xarelto  for once daily dosing -Psychiatry has deemed the patient to lack capacity; he continues to remain noncompliant at times -Unable to force meds.  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  Eliquis  changed to Xarelto , Coreg  long-acting.   Schizoaffective disorder bipolar disorder lacks medical capacity: Psych was consulted and recommended that the patient lacks medical capacity to make decisions if the patient attempts to leave AGAINST MEDICAL ADVICE he should be involuntarily committed. Psych recommending APS guardianship, TOC in touch with APS regarding this as patient lacks medical capacity   Essential hypertension:  Pressures are labile.  Currently on Coreg , Lasix , losartan , Aldactone .  Intermittent noncompliance  Hyperlipidemia: Continue Crestor .   Diabetes mellitus type 2:  last A1c of 5.4. refuses monitoring    Polysubstance abuse: UDS was positive for cocaine and benzos.   Deconditioning: Ambulatory status complicated by previous right AKA and left BKA has no insurance coverage for SNF. - refusing therapy at times   History of COPD: Continue inhalers   History of DVT: On Xarelto  but frequently refuses   Estimated body mass index is 20.09 kg/m as calculated from the following:   Height as of this encounter: 5' 9 (1.753 m).  Weight as of this encounter: 61.7 kg   Interval History:  Sitting wheelchair. Playing cards. No complaints.    Antimicrobials:   DVT prophylaxis:   rivaroxaban  (XARELTO ) tablet 20 mg   Code Status:   Code Status: Full Code  Mobility Assessment (Last 72 Hours)     Mobility Assessment     Row Name 05/24/24 2015 05/24/24 0800 05/24/24 0022 05/23/24 2112 05/23/24 1500   Does patient have an order for bedrest or is patient medically unstable No - Continue assessment No - Continue assessment No - Continue assessment No - Continue assessment --   What is the highest level of mobility based on the progressive mobility assessment? Level 2 (Chairfast) - Balance while sitting on edge of bed and cannot stand Level 2 (Chairfast) - Balance while sitting on edge of bed and cannot stand Level 2 (Chairfast) - Balance while sitting on edge of bed and cannot stand Level 2 (Chairfast) - Balance while sitting on edge of bed and cannot stand Level 2 (Chairfast) - Balance while sitting on edge of bed and cannot stand   Is the above level different from baseline mobility prior to current illness? Yes - Recommend PT order Yes - Recommend PT order -- -- --    Row Name 05/22/24 2000           Does patient have an order for bedrest or is patient medically unstable No - Continue assessment       What is the highest level of mobility based on the progressive mobility assessment? Level 2 (Chairfast) - Balance while sitting on edge of bed and cannot stand       Is the above level different from baseline mobility prior to current illness? Yes - Recommend PT order          Barriers to discharge: APS to obtain guardianship Disposition Plan:  TBD HH orders placed: n/a Status is: Inpt  Objective: Blood pressure (!) 137/92, pulse 77, temperature 98 F (36.7 C), temperature source Axillary, resp. rate 17, height 5' 9 (1.753 m), weight 61.7 kg, SpO2 97%.  Examination:  General: NAD Lungs: Normal Respiratory effort Extremities: Left BKA, Right AKA   Consultants:  Psych      LOS: 82 days   [name],  MD [facility] 05/25/2024, 7:26 AM

## 2024-05-26 DIAGNOSIS — I639 Cerebral infarction, unspecified: Secondary | ICD-10-CM | POA: Diagnosis not present

## 2024-05-26 NOTE — Progress Notes (Signed)
 Progress Note    [name]   FMW:[id number]  DOB: 1966-08-18  DOA: 03/04/2024     85 PCP: [name] Bleacher, MD  Initial CC: Slurred speech, confusion  Hospital Course: 58 year old with history significant for COPD, cocaine abuse, presented to the ED 03/04/24 for slower speech, confusion and right-sided weakness, CT head showed left MCA infarct, CTA head and neck revealed an M2 occlusion on the left,   Echocardiogram revealed large mobile left ventricular thrombus on the left and right ventricular apical thrombus in the right, ejection fraction less than 20%.  Cardiology was consulted recommended DOAC.  PT evaluated patient and recommended skilled nursing facility placement.  Patient was found to lack medical decision capacity/status and not amenable to determining his discharge disposition or leave AGAINST MEDICAL ADVICE.   He intermittently declines physical exams and medications. TOC is pursuing guardianship with APS as he is lacking medical decision capacity. Psychiatry presently recommending to await APS decision for guardianship before discharging home.  Patient does not qualify for forced medication administration   No changes in medical condition.   Assessment & Plan:  Principal Problem:   Acute CVA (cerebrovascular accident) (HCC) Active Problems:   LV (left ventricular) mural thrombus   Chronic systolic CHF (congestive heart failure) (HCC)   Prolonged QT interval   Elevated liver enzymes   COPD (chronic obstructive pulmonary disease) (HCC)   Cocaine abuse (HCC)   Schizoaffective disorder, bipolar type (HCC)   Benign essential HTN   Acid reflux   History of DVT (deep vein thrombosis)   Type 2 diabetes mellitus (HCC)   Status post bilateral BKA.   Dilated cardiomyopathy (HCC)    Acute CVA (cerebrovascular accident) (HCC) -He underwent extensive workup and imaging revealed left MCA territory infarct, caudate nucleus, lentiform nucleus and internal capsule MRI  confirmed. -TEE was noted for large mobile left lower clot just under the mitral valve with an EF of 20% and a small right apical clot with reduced RV systolic function. -He was started on Eliquis  for intracardiac thrombus, however he remained noncompliant.  In order to improve compliance, medicine changed to Xarelto  for once daily dosing -Psychiatry has deemed the patient to lack capacity; he continues to remain noncompliant at times -Unable to force meds.  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  Eliquis  changed to Xarelto , Coreg  long-acting.   Schizoaffective disorder bipolar disorder lacks medical capacity: Psych was consulted and recommended that the patient lacks medical capacity to make decisions if the patient attempts to leave AGAINST MEDICAL ADVICE he should be involuntarily committed. Psych recommending APS guardianship, TOC in touch with APS regarding this as patient lacks medical capacity   Essential hypertension:  Pressures are labile.  Currently on Coreg , Lasix , losartan , Aldactone .  Intermittent noncompliance  Hyperlipidemia: Continue Crestor .   Diabetes mellitus type 2:  last A1c of 5.4. refuses monitoring    Polysubstance abuse: UDS was positive for cocaine and benzos.   Deconditioning: Ambulatory status complicated by previous right AKA and left BKA has no insurance coverage for SNF. - refusing therapy at times   History of COPD: Continue inhalers   History of DVT: On Xarelto  but frequently refuses   Estimated body mass index is 20.09 kg/m as calculated from the following:   Height as of this encounter: 5' 9 (1.753 m).  Weight as of this encounter: 61.7 kg   Interval History:  Alert, seen in his room. Continue to refuse  spironolactone . I tried to assess why he would refuse diuretics. He didn't want to talk about it.   Antimicrobials:   DVT prophylaxis:   rivaroxaban  (XARELTO ) tablet 20 mg   Code Status:   Code Status: Full Code  Mobility Assessment (Last 72 Hours)     Mobility Assessment     Row Name 05/25/24 2015 05/25/24 0800 05/24/24 2015 05/24/24 0800 05/24/24 0022   Does patient have an order for bedrest or is patient medically unstable No - Continue assessment No - Continue assessment No - Continue assessment No - Continue assessment No - Continue assessment   What is the highest level of mobility based on the progressive mobility assessment? Level 2 (Chairfast) - Balance while sitting on edge of bed and cannot stand Level 2 (Chairfast) - Balance while sitting on edge of bed and cannot stand Level 2 (Chairfast) - Balance while sitting on edge of bed and cannot stand Level 2 (Chairfast) - Balance while sitting on edge of bed and cannot stand Level 2 (Chairfast) - Balance while sitting on edge of bed and cannot stand   Is the above level different from baseline mobility prior to current illness? Yes - Recommend PT order Yes - Recommend PT order Yes - Recommend PT order Yes - Recommend PT order --    Row Name 05/23/24 2112 05/23/24 1500         Does patient have an order for bedrest or is patient medically unstable No - Continue assessment --      What is the highest level of mobility based on the progressive mobility assessment? Level 2 (Chairfast) - Balance while sitting on edge of bed and cannot stand Level 2 (Chairfast) - Balance while sitting on edge of bed and cannot stand         Barriers to discharge: APS to obtain guardianship Disposition Plan:  TBD HH orders placed: n/a Status is: Inpt  Objective: Blood pressure 136/81, pulse 69, temperature 98 F (36.7 C), temperature source Oral, resp. rate 16, height 5' 9 (1.753 m), weight 61.7 kg, SpO2 97%.  Examination:  General: NAD Lungs:  Normal Resp effort Extremities: Left BKA, Right AKA   Consultants:  Psych      LOS: 83 days   [name], MD [facility] 05/26/2024, 1:50 PM

## 2024-05-26 NOTE — Progress Notes (Signed)
 Occupational Therapy Treatment Patient Details Name: [name] MRN: [id number] DOB: 10/14/1966 Today's Date: 05/26/2024   History of present illness Patient is 58 y.o. male presented to ED on 03/04/24 with stroke like symptoms when caregiver found him with slurred speech and right sided weakness. Head CT reveals Acute left MCA territory infarct affecting the insula, caudate nucleus, lentiform nucleus and internal capsule. PMH significant of  Schizoaffective disorder, heart failure with reduced ejection fraction, pretension, DMII, COPD, s/p bilateral amputations, chronic pain, and polysubstance abuse.   OT comments  Pt receptive to going off unit today and working on dynamic reaching tasks outside to challenge his balance, RUE coordination, and safety awareness. Pt self correcting his position prn to accurately reach cup with RUE, still some dysmetria of that extremity but it is slowly improving. He made good use of brake management 80% of the time to support his balance while in the w/c. Discussed potentially getting his prosthesis from home then we could collaborate in an effort to procure a second prosthesis for his LLE. OT to continue following pt acutely to progress pt as able.       If plan is discharge home, recommend the following:  A little help with walking and/or transfers;A little help with bathing/dressing/bathroom;Supervision due to cognitive status;Direct supervision/assist for financial management;Direct supervision/assist for medications management   Equipment Recommendations  None recommended by OT    Recommendations for Other Services      Precautions / Restrictions Precautions Precautions: Fall Recall of Precautions/Restrictions: Impaired Precaution/Restrictions Comments: h/o BLE amputations       Mobility Bed Mobility               General bed mobility comments: Pt in w/c on arrival      Balance Overall balance assessment: Mild deficits observed, not  formally tested             ADL either performed or assessed with clinical judgement   ADL       Functional mobility during ADLs: Supervision/safety;Wheelchair General ADL Comments: Worked with pt off unit on w/c propulsion to increase distance traveled per push, cues to incorporate a larger movement but pt only able to replicate it twice then did not seem too bothered or interested in focusing on the lack of distance he was getting. Once outside worked with pt on dynamic reaching using therapy cups located alonge various planes and levels to address proper use of brake use to support balance in w/c and incorporate coordination of RUE.    Extremity/Trunk Assessment Upper Extremity Assessment RUE Deficits / Details: ROM and grip strength WFL, impaired coordination with FTN and reaching tasks, decreased proprioception. Difficulty manipulating smaller or awkward shaped objects (clothes pins). Pt coordination slightly improving-getting more accurate with targets but still dysmetric. RUE also not producing enough force as baseline to propel w/c-suggested to pt that this is because this may be because his fingers may be knicking the spokes but he did not seem to agree that that was the problem. RUE Coordination: decreased fine motor;decreased gross motor                     Communication Communication Communication: Impaired Factors Affecting Communication: Difficulty expressing self;Reduced clarity of speech   Cognition Arousal: Alert Behavior During Therapy: WFL for tasks assessed/performed Cognition: Cognition impaired Difficult to assess due to: Impaired communication   Awareness: Intellectual awareness intact, Online awareness impaired Memory impairment (select all impairments): Working Biochemist, clinical functioning impairment (select all  impairments): Problem solving                   Following commands: Intact Following commands impaired: Follows one step commands  inconsistently, Follows one step commands with increased time (Responds better when commands are presented more like invitations)      Cueing   Cueing Techniques: Verbal cues  Exercises Other Exercises Other Exercises: Cup stacking with RUE. Repetition x2 Other Exercises: dynamic reaching on low and high level surfaces with RUE.    Shoulder Instructions       General Comments Pt returnd to room at end of session. Discussed potentially getting his prosthesis from home then we couldl work on potentially work on procuring his other prosthesis if interested.    Pertinent Vitals/ Pain       Pain Assessment Pain Assessment: No/denies pain   Frequency  Min 1X/week        Progress Toward Goals  OT Goals(current goals can now be found in the care plan section)  Progress towards OT goals: Progressing toward goals (slow progression)  Acute Rehab OT Goals Patient Stated Goal: To keep working on R arm OT Goal Formulation: With patient Time For Goal Achievement: 06/25/24 Potential to Achieve Goals: Fair  Plan         AM-PAC OT 6 Clicks Daily Activity     Outcome Measure   Help from another person eating meals?: None Help from another person taking care of personal grooming?: A Little Help from another person toileting, which includes using toliet, bedpan, or urinal?: A Lot Help from another person bathing (including washing, rinsing, drying)?: A Little Help from another person to put on and taking off regular upper body clothing?: A Little Help from another person to put on and taking off regular lower body clothing?: A Little 6 Click Score: 18    End of Session Equipment Utilized During Treatment: Other (comment) (w/c)  OT Visit Diagnosis: Unsteadiness on feet (R26.81);Other abnormalities of gait and mobility (R26.89);Muscle weakness (generalized) (M62.81);Other symptoms and signs involving the nervous system (R29.898);Other symptoms and signs involving cognitive  function;Hemiplegia and hemiparesis Hemiplegia - Right/Left: Right Hemiplegia - dominant/non-dominant: Dominant Hemiplegia - caused by: Cerebral infarction   Activity Tolerance Patient tolerated treatment well   Patient Left Other (comment);with call bell/phone within reach (in w/c within his room)   Nurse Communication Mobility status        Time: 1650-1720 OT Time Calculation (min): 30 min  Charges: OT General Charges $OT Visit: 1 Visit OT Treatments $Therapeutic Activity: 23-37 mins  05/26/2024  AB, OTR/L  Acute Rehabilitation Services  Office: [phone]   Curtistine JONETTA Das 05/26/2024, 6:31 PM

## 2024-05-26 NOTE — Plan of Care (Signed)
 Very pleasant all day today.  Took all of his meds today.  [place] self in the w/c around the unit.  In good spirits.     Problem: Coping: Goal: Level of anxiety will decrease Outcome: Progressing   Problem: Pain Managment: Goal: General experience of comfort will improve and/or be controlled Outcome: Progressing   Problem: Safety: Goal: Ability to remain free from injury will improve Outcome: Progressing   Problem: Skin Integrity: Goal: Risk for impaired skin integrity will decrease Outcome: Progressing

## 2024-05-26 NOTE — Progress Notes (Signed)
 SLP Cancellation Note  Patient Details Name: [name] MRN: [id number] DOB: 21-May-1966   Cancelled treatment:       Reason Eval/Treat Not Completed: Patient unavailable - earlier this morning was sleeping soundly and this afternoon is not in his room. Will f/u as able.     Leita SAILOR., M.A. CCC-SLP Acute Rehabilitation Services Office: [phone]  Secure chat preferred  05/26/2024, 1:28 PM

## 2024-05-27 DIAGNOSIS — I639 Cerebral infarction, unspecified: Secondary | ICD-10-CM | POA: Diagnosis not present

## 2024-05-27 NOTE — Progress Notes (Signed)
 Chaplain met with pt to discuss court hearing and his emotions surrounding this. Pt communicated that things didn't go as he'd hoped, however he was taking things one day at a time, and through faith in God, felt sure that things would work out in the end. His next hearing is 7/29, and he hopes to secure a new lawyer prior to that time.

## 2024-05-27 NOTE — Progress Notes (Signed)
 Progress Note    [name]   FMW:[id number]  DOB: Jun 30, 1966  DOA: 03/04/2024     86 PCP: Tanda Bleacher, MD  Initial CC: Slurred speech, confusion  Hospital Course: 58 year old with history significant for COPD, cocaine abuse, presented to the ED 03/04/24 for slower speech, confusion and right-sided weakness, CT head showed left MCA infarct, CTA head and neck revealed an M2 occlusion on the left,   Echocardiogram revealed large mobile left ventricular thrombus on the left and right ventricular apical thrombus in the right, ejection fraction less than 20%.  Cardiology was consulted recommended DOAC.  PT evaluated patient and recommended skilled nursing facility placement.  Patient was found to lack medical decision capacity/status and not amenable to determining his discharge disposition or leave AGAINST MEDICAL ADVICE.   He intermittently declines physical exams and medications. TOC is pursuing guardianship with APS as he is lacking medical decision capacity. Psychiatry presently recommending to await APS decision for guardianship before discharging home.  Patient does not qualify for forced medication administration   No changes in medical condition.   Assessment & Plan:  Principal Problem:   Acute CVA (cerebrovascular accident) (HCC) Active Problems:   LV (left ventricular) mural thrombus   Chronic systolic CHF (congestive heart failure) (HCC)   Prolonged QT interval   Elevated liver enzymes   COPD (chronic obstructive pulmonary disease) (HCC)   Cocaine abuse (HCC)   Schizoaffective disorder, bipolar type (HCC)   Benign essential HTN   Acid reflux   History of DVT (deep vein thrombosis)   Type 2 diabetes mellitus (HCC)   Status post bilateral BKA.   Dilated cardiomyopathy (HCC)    Acute CVA (cerebrovascular accident) (HCC) -He underwent extensive workup and imaging revealed left MCA territory infarct, caudate nucleus, lentiform nucleus and internal capsule MRI  confirmed. -TEE was noted for large mobile left lower clot just under the mitral valve with an EF of 20% and a small right apical clot with reduced RV systolic function. -He was started on Eliquis  for intracardiac thrombus, however he remained noncompliant.  In order to improve compliance, medicine changed to Xarelto  for once daily dosing -Psychiatry has deemed the patient to lack capacity; he continues to remain noncompliant at times -Unable to force meds.  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  Eliquis  changed to Xarelto , Coreg  long-acting.   Schizoaffective disorder bipolar disorder lacks medical capacity: Psych was consulted and recommended that the patient lacks medical capacity to make decisions if the patient attempts to leave AGAINST MEDICAL ADVICE he should be involuntarily committed. Psych recommending APS guardianship, TOC in touch with APS regarding this as patient lacks medical capacity   Essential hypertension:  Pressures are labile.  Currently on Coreg , Lasix , losartan , Aldactone .  Intermittent noncompliance  Hyperlipidemia: Continue Crestor .   Diabetes mellitus type 2:  last A1c of 5.4. refuses monitoring    Polysubstance abuse: UDS was positive for cocaine and benzos.   Deconditioning: Ambulatory status complicated by previous right AKA and left BKA has no insurance coverage for SNF. - refusing therapy at times   History of COPD: Continue inhalers   History of DVT: On Xarelto  but frequently refuses   Estimated body mass index is 20.09 kg/m as calculated from the following:   Height as of this encounter: 5' 9 (1.753 m).  Weight as of this encounter: 61.7 kg   Interval History:  Alert, didn't want to speak with me today    Antimicrobials:   DVT prophylaxis:   rivaroxaban  (XARELTO ) tablet 20 mg   Code Status:   Code Status: Full Code  Mobility Assessment (Last 72 Hours)     Mobility Assessment     Row Name 05/26/24 2025 05/26/24 1829 05/26/24 0800 05/25/24 2015 05/25/24 0800   Does patient have an order for bedrest or is patient medically unstable No - Continue assessment -- No - Continue assessment No - Continue assessment No - Continue assessment   What is the highest level of mobility based on the progressive mobility assessment? Level 2 (Chairfast) - Balance while sitting on edge of bed and cannot stand Level 2 (Chairfast) - Balance while sitting on edge of bed and cannot stand Level 2 (Chairfast) - Balance while sitting on edge of bed and cannot stand Level 2 (Chairfast) - Balance while sitting on edge of bed and cannot stand Level 2 (Chairfast) - Balance while sitting on edge of bed and cannot stand   Is the above level different from baseline mobility prior to current illness? -- -- Yes - Recommend PT order Yes - Recommend PT order Yes - Recommend PT order    Row Name 05/24/24 2015 05/24/24 0800         Does patient have an order for bedrest or is patient medically unstable No - Continue assessment No - Continue assessment      What is the highest level of mobility based on the progressive mobility assessment? Level 2 (Chairfast) - Balance while sitting on edge of bed and cannot stand Level 2 (Chairfast) - Balance while sitting on edge of bed and cannot stand      Is the above level different from baseline mobility prior to current illness? Yes - Recommend PT order Yes - Recommend PT order         Barriers to discharge: APS to obtain guardianship Disposition Plan:  TBD HH orders placed: n/a Status is: Inpt  Objective: Blood pressure (!) 156/108, pulse 75, temperature 98.3 F (36.8 C), resp. rate 17, height 5' 9 (1.753 m), weight 61.7 kg, SpO2 97%.  Examination:  General: NAD  Lungs:  Normal resp effort.  Extremities: Left BKA, Right AKA   Consultants:  Psych      LOS: 84 days   [name], MD [facility] 05/27/2024, 7:23 AM

## 2024-05-27 NOTE — TOC Progression Note (Signed)
 Transition of Care [facility]) - Progression Note    Patient Details  Name: [name] MRN: [id number] Date of Birth: 01-Jul-1966  [facility] CM/SW Contact  [name], [name] Phone Number: 05/27/2024, 3:26 PM  Clinical Narrative:   CSW spoke with patient's caregiver, [name], to discuss update from court yesterday. Per [name], the patient's guardianship hearing has been rescheduled because the attorney never showed up. New court date is July 29 at 11am, and she informed the patient this morning. CSW discussed concern about patient's disability paperwork, and [name] states that the patient should have a copy of the paperwork from the court that guardianship was filed. CSW obtained copy of the paperwork. A guardian ad litem is named on the document, but the phone number was incorrect. CSW researched to obtain the correct phone number for guardian ad litem, [name] [phone]). A voicemail was left for [name] to ask if he is able to sign the disability paperwork until a guardian is named for the patient, awaiting response.   CSW contacted Rocky Pouch with [facility] to discuss any possibility of extending the disability deadline waiting on a decision maker. Rocky will come and witness the patient's signature, as she is comfortable doing that since the court has not officially deemed the patient incompetent at this time. Meeting scheduled for 8:30 am on Monday. CSW to follow.    Expected Discharge Plan: Skilled Nursing Facility Barriers to Discharge: English as a second language teacher, Active Substance Use - Placement, SNF Pending bed offer  Expected Discharge Plan and Services       Living arrangements for the past 2 months: Apartment Expected Discharge Date: 03/14/24                                     Social Determinants of Health (SDOH) Interventions SDOH Screenings   Food Insecurity: Food Insecurity Present (03/06/2024)  Housing: High Risk (03/06/2024)  Transportation Needs:  No Transportation Needs (03/06/2024)  Utilities: Patient Declined (03/06/2024)  Alcohol Screen: Medium Risk (06/24/2019)  Depression (PHQ2-9): Low Risk  (02/18/2024)  Recent Concern: Depression (PHQ2-9) - Medium Risk (01/21/2024)  Financial Resource Strain: High Risk (07/24/2021)   Received from [facility]  Physical Activity: Inactive (02/18/2024)  Social Connections: Unknown (04/05/2022)   Received from [facility]  Stress: Stress Concern Present (07/24/2021)   Received from [facility]  Tobacco Use: High Risk (03/04/2024)  Health Literacy: Adequate Health Literacy (02/18/2024)    Readmission Risk Interventions    12/21/2023    9:25 AM 12/09/2022    9:02 AM 12/01/2022    3:56 PM  Readmission Risk Prevention Plan  Transportation Screening Complete Complete Complete  HRI or Home Care Consult Complete    Social Work Consult for Recovery Care Planning/Counseling Complete    Palliative Care Screening Not Applicable    Medication Review Oceanographer) Complete Complete Complete  PCP or Specialist appointment within 3-5 days of discharge  Complete Complete  HRI or Home Care Consult  Complete Complete  SW Recovery Care/Counseling Consult  Complete Complete  Palliative Care Screening  Not Applicable Not Applicable  Skilled Nursing Facility  Not Applicable Not Applicable  SNF Comments   n/a

## 2024-05-27 NOTE — Progress Notes (Signed)
 SLP Cancellation Note  Patient Details Name: [name] MRN: [id number] DOB: 08-10-1966   Cancelled treatment:       Reason Eval/Treat Not Completed: Patient declined, stating that it was not a good day. Offered to come by later and he said no, that there was too much already today. Will continue to follow.     Leita SAILOR., M.A. CCC-SLP Acute Rehabilitation Services Office: [phone]  Secure chat preferred  05/27/2024, 11:20 AM

## 2024-05-27 NOTE — Plan of Care (Signed)
 Pt in great mood and compliant. Free to roam around in wheelchair.   Problem: Safety: Goal: Ability to remain free from injury will improve Outcome: Progressing   Problem: Skin Integrity: Goal: Risk for impaired skin integrity will decrease Outcome: Progressing   Problem: Coping: Goal: Level of anxiety will decrease Outcome: Progressing

## 2024-05-28 DIAGNOSIS — I639 Cerebral infarction, unspecified: Secondary | ICD-10-CM | POA: Diagnosis not present

## 2024-05-28 NOTE — Progress Notes (Signed)
 Progress Note    [name]   FMW:[id number]  DOB: 1966-11-25  DOA: 03/04/2024     87 PCP: Tanda Bleacher, MD  Initial CC: Slurred speech, confusion  Hospital Course: 58 year old with history significant for COPD, cocaine abuse, presented to the ED 03/04/24 for slower speech, confusion and right-sided weakness, CT head showed left MCA infarct, CTA head and neck revealed an M2 occlusion on the left,   Echocardiogram revealed large mobile left ventricular thrombus on the left and right ventricular apical thrombus in the right, ejection fraction less than 20%.  Cardiology was consulted recommended DOAC.  PT evaluated patient and recommended skilled nursing facility placement.  Patient was found to lack medical decision capacity/status and not amenable to determining his discharge disposition or leave AGAINST MEDICAL ADVICE.   He intermittently declines physical exams and medications. TOC is pursuing guardianship with APS as he is lacking medical decision capacity. Psychiatry presently recommending to await APS decision for guardianship before discharging home.  Patient does not qualify for forced medication administration   No changes in medical condition.   Assessment & Plan:  Principal Problem:   Acute CVA (cerebrovascular accident) (HCC) Active Problems:   LV (left ventricular) mural thrombus   Chronic systolic CHF (congestive heart failure) (HCC)   Prolonged QT interval   Elevated liver enzymes   COPD (chronic obstructive pulmonary disease) (HCC)   Cocaine abuse (HCC)   Schizoaffective disorder, bipolar type (HCC)   Benign essential HTN   Acid reflux   History of DVT (deep vein thrombosis)   Type 2 diabetes mellitus (HCC)   Status post bilateral BKA.   Dilated cardiomyopathy (HCC)    Acute CVA (cerebrovascular accident) (HCC) -He underwent extensive workup and imaging revealed left MCA territory infarct, caudate nucleus, lentiform nucleus and internal capsule MRI  confirmed. -TEE was noted for large mobile left lower clot just under the mitral valve with an EF of 20% and a small right apical clot with reduced RV systolic function. -He was started on Eliquis  for intracardiac thrombus, however he remained noncompliant.  In order to improve compliance, medicine changed to Xarelto  for once daily dosing -Psychiatry has deemed the patient to lack capacity; he continues to remain noncompliant at times -Unable to force meds.  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  Eliquis  changed to Xarelto , Coreg  long-acting.   Schizoaffective disorder bipolar disorder lacks medical capacity: Psych was consulted and recommended that the patient lacks medical capacity to make decisions if the patient attempts to leave AGAINST MEDICAL ADVICE he should be involuntarily committed. Psych recommending APS guardianship, TOC in touch with APS regarding this as patient lacks medical capacity   Essential hypertension:  Pressures are labile.  Currently on Coreg , Lasix , losartan , Aldactone .  Intermittent noncompliance  Hyperlipidemia: Continue Crestor .   Diabetes mellitus type 2:  last A1c of 5.4. refuses monitoring    Polysubstance abuse: UDS was positive for cocaine and benzos.   Deconditioning: Ambulatory status complicated by previous right AKA and left BKA has no insurance coverage for SNF. - refusing therapy at times   History of COPD: Continue inhalers   History of DVT: On Xarelto  but frequently refuses   Estimated body mass index is 20.09 kg/m as calculated from the following:   Height as of this encounter: 5' 9 (1.753 m).  Weight as of this encounter: 61.7 kg   Interval History:  Looks sad, does not want to talk at this  time  Antimicrobials:   DVT prophylaxis:   rivaroxaban  (XARELTO ) tablet 20 mg   Code Status:   Code Status: Full Code  Mobility Assessment (Last 72 Hours)     Mobility Assessment     Row Name 05/28/24 1058 05/28/24 0908 05/28/24 0819 05/27/24 2000 05/27/24 0800   Does patient have an order for bedrest or is patient medically unstable No - Continue assessment No - Continue assessment No - Continue assessment No - Continue assessment No - Continue assessment   What is the highest level of mobility based on the progressive mobility assessment? Level 2 (Chairfast) - Balance while sitting on edge of bed and cannot stand -- Level 2 (Chairfast) - Balance while sitting on edge of bed and cannot stand Level 2 (Chairfast) - Balance while sitting on edge of bed and cannot stand Level 2 (Chairfast) - Balance while sitting on edge of bed and cannot stand   Is the above level different from baseline mobility prior to current illness? -- -- -- Yes - Recommend PT order Yes - Recommend PT order    Row Name 05/26/24 2025 05/26/24 1829 05/26/24 0800 05/25/24 2015     Does patient have an order for bedrest or is patient medically unstable No - Continue assessment -- No - Continue assessment No - Continue assessment    What is the highest level of mobility based on the progressive mobility assessment? Level 2 (Chairfast) - Balance while sitting on edge of bed and cannot stand Level 2 (Chairfast) - Balance while sitting on edge of bed and cannot stand Level 2 (Chairfast) - Balance while sitting on edge of bed and cannot stand Level 2 (Chairfast) - Balance while sitting on edge of bed and cannot stand    Is the above level different from baseline mobility prior to current illness? -- -- Yes - Recommend PT order Yes - Recommend PT order       Barriers to discharge: APS to obtain guardianship Disposition Plan:  TBD HH orders placed: n/a Status is: Inpt  Objective: Blood pressure (!) 149/97, pulse 67,  temperature 98.4 F (36.9 C), temperature source Oral, resp. rate 17, height 5' 9 (1.753 m), weight 61.7 kg, SpO2 98%.  Examination:  General: NAD Lungs: Normal resp effort Extremities: Left BKA, Right AKA   Consultants:  Psych      LOS: 85 days   [name], MD [facility] 05/28/2024, 1:52 PM

## 2024-05-29 DIAGNOSIS — I639 Cerebral infarction, unspecified: Secondary | ICD-10-CM | POA: Diagnosis not present

## 2024-05-29 NOTE — Progress Notes (Signed)
 Progress Note    [name]   FMW:[id number]  DOB: 1966-08-22  DOA: 03/04/2024     88 PCP: Tanda Bleacher, MD  Initial CC: Slurred speech, confusion  Hospital Course: 58 year old with history significant for COPD, cocaine abuse, presented to the ED 03/04/24 for slower speech, confusion and right-sided weakness, CT head showed left MCA infarct, CTA head and neck revealed an M2 occlusion on the left,   Echocardiogram revealed large mobile left ventricular thrombus on the left and right ventricular apical thrombus in the right, ejection fraction less than 20%.  Cardiology was consulted recommended DOAC.  PT evaluated patient and recommended skilled nursing facility placement.  Patient was found to lack medical decision capacity/status and not amenable to determining his discharge disposition or leave AGAINST MEDICAL ADVICE.   He intermittently declines physical exams and medications. TOC is pursuing guardianship with APS as he is lacking medical decision capacity. Psychiatry presently recommending to await APS decision for guardianship before discharging home.  Patient does not qualify for forced medication administration   No changes in medical condition.   Assessment & Plan:  Principal Problem:   Acute CVA (cerebrovascular accident) (HCC) Active Problems:   LV (left ventricular) mural thrombus   Chronic systolic CHF (congestive heart failure) (HCC)   Prolonged QT interval   Elevated liver enzymes   COPD (chronic obstructive pulmonary disease) (HCC)   Cocaine abuse (HCC)   Schizoaffective disorder, bipolar type (HCC)   Benign essential HTN   Acid reflux   History of DVT (deep vein thrombosis)   Type 2 diabetes mellitus (HCC)   Status post bilateral BKA.   Dilated cardiomyopathy (HCC)    Acute CVA (cerebrovascular accident) (HCC) -He underwent extensive workup and imaging revealed left MCA territory infarct, caudate nucleus, lentiform nucleus and internal capsule MRI  confirmed. -TEE was noted for large mobile left lower clot just under the mitral valve with an EF of 20% and a small right apical clot with reduced RV systolic function. -He was started on Eliquis  for intracardiac thrombus, however he remained noncompliant.  In order to improve compliance, medicine changed to Xarelto  for once daily dosing -Psychiatry has deemed the patient to lack capacity; he continues to remain noncompliant at times -Unable to force meds.  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  Eliquis  changed to Xarelto , Coreg  long-acting.   Schizoaffective disorder bipolar disorder lacks medical capacity: Psych was consulted and recommended that the patient lacks medical capacity to make decisions if the patient attempts to leave AGAINST MEDICAL ADVICE he should be involuntarily committed. Psych recommending APS guardianship, TOC in touch with APS regarding this as patient lacks medical capacity   Essential hypertension:  Pressures are labile.  Currently on Coreg , Lasix , losartan , Aldactone .  Intermittent noncompliance  Hyperlipidemia: Continue Crestor .   Diabetes mellitus type 2:  last A1c of 5.4. refuses monitoring    Polysubstance abuse: UDS was positive for cocaine and benzos.   Deconditioning: Ambulatory status complicated by previous right AKA and left BKA has no insurance coverage for SNF. - refusing therapy at times   History of COPD: Continue inhalers   History of DVT: On Xarelto  but frequently refuses   Estimated body mass index is 20.09 kg/m as calculated from the following:   Height as of this encounter: 5' 9 (1.753 m).  Weight as of this encounter: 61.7 kg   Interval History:  Seen in the hall, on his wheel chair. No complaints.   Antimicrobials:   DVT prophylaxis:   rivaroxaban  (XARELTO ) tablet 20 mg   Code Status:   Code Status: Full Code  Mobility Assessment (Last 72 Hours)     Mobility Assessment     Row Name 05/29/24 0900 05/28/24 2000 05/28/24 1058 05/28/24 0908 05/28/24 0819   Does patient have an order for bedrest or is patient medically unstable Yes- Bedfast (Level 1) - Complete No - Continue assessment No - Continue assessment No - Continue assessment No - Continue assessment   What is the highest level of mobility based on the progressive mobility assessment? Level 2 (Chairfast) - Balance while sitting on edge of bed and cannot stand Level 2 (Chairfast) - Balance while sitting on edge of bed and cannot stand Level 2 (Chairfast) - Balance while sitting on edge of bed and cannot stand -- Level 2 (Chairfast) - Balance while sitting on edge of bed and cannot stand   Is the above level different from baseline mobility prior to current illness? -- Yes - Recommend PT order -- -- --    Row Name 05/27/24 2000 05/27/24 0800 05/26/24 2025 05/26/24 1829     Does patient have an order for bedrest or is patient medically unstable No - Continue assessment No - Continue assessment No - Continue assessment --    What is the highest level of mobility based on the progressive mobility assessment? Level 2 (Chairfast) - Balance while sitting on edge of bed and cannot stand Level 2 (Chairfast) - Balance while sitting on edge of bed and cannot stand Level 2 (Chairfast) - Balance while sitting on edge of bed and cannot stand Level 2 (Chairfast) - Balance while sitting on edge of bed and cannot stand    Is the above level different from baseline mobility prior to current illness? Yes - Recommend PT order Yes - Recommend PT order -- --       Barriers to discharge: APS to obtain guardianship Disposition Plan:  TBD HH orders placed: n/a Status is: Inpt  Objective: Blood pressure (!) 144/109, pulse 77, temperature 97.9 F  (36.6 C), resp. rate 18, height 5' 9 (1.753 m), weight 61.7 kg, SpO2 95%.  Examination:  General: NAD Lungs: Normal resp effort Extremities: Left BKA, Right AKA   Consultants:  Psych      LOS: 86 days   [name], MD [facility] 05/29/2024, 1:24 PM

## 2024-05-30 DIAGNOSIS — I639 Cerebral infarction, unspecified: Secondary | ICD-10-CM | POA: Diagnosis not present

## 2024-05-30 NOTE — Progress Notes (Signed)
 Progress Note    [name]   FMW:[id number]  DOB: 1966/08/12  DOA: 03/04/2024     89 PCP: Tanda Bleacher, MD  Initial CC: Slurred speech, confusion  Hospital Course: 58 year old with history significant for COPD, cocaine abuse, presented to the ED 03/04/24 for slower speech, confusion and right-sided weakness, CT head showed left MCA infarct, CTA head and neck revealed an M2 occlusion on the left,   Echocardiogram revealed large mobile left ventricular thrombus on the left and right ventricular apical thrombus in the right, ejection fraction less than 20%.  Cardiology was consulted recommended DOAC.  PT evaluated patient and recommended skilled nursing facility placement.  Patient was found to lack medical decision capacity/status and not amenable to determining his discharge disposition or leave AGAINST MEDICAL ADVICE.   He intermittently declines physical exams and medications. TOC is pursuing guardianship with APS as he is lacking medical decision capacity. Psychiatry presently recommending to await APS decision for guardianship before discharging home.  Patient does not qualify for forced medication administration   No changes in medical condition.   Assessment & Plan:  Principal Problem:   Acute CVA (cerebrovascular accident) (HCC) Active Problems:   LV (left ventricular) mural thrombus   Chronic systolic CHF (congestive heart failure) (HCC)   Prolonged QT interval   Elevated liver enzymes   COPD (chronic obstructive pulmonary disease) (HCC)   Cocaine abuse (HCC)   Schizoaffective disorder, bipolar type (HCC)   Benign essential HTN   Acid reflux   History of DVT (deep vein thrombosis)   Type 2 diabetes mellitus (HCC)   Status post bilateral BKA.   Dilated cardiomyopathy (HCC)    Acute CVA (cerebrovascular accident) (HCC) -He underwent extensive workup and imaging revealed left MCA territory infarct, caudate nucleus, lentiform nucleus and internal capsule MRI  confirmed. -TEE was noted for large mobile left lower clot just under the mitral valve with an EF of 20% and a small right apical clot with reduced RV systolic function. -He was started on Eliquis  for intracardiac thrombus, however he remained noncompliant.  In order to improve compliance, medicine changed to Xarelto  for once daily dosing -Psychiatry has deemed the patient to lack capacity; he continues to remain noncompliant at times -Unable to force meds.  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  Eliquis  changed to Xarelto , Coreg  long-acting.   Schizoaffective disorder bipolar disorder lacks medical capacity: Psych was consulted and recommended that the patient lacks medical capacity to make decisions if the patient attempts to leave AGAINST MEDICAL ADVICE he should be involuntarily committed. Psych recommending APS guardianship, TOC in touch with APS regarding this as patient lacks medical capacity   Essential hypertension:  Pressures are labile.  Currently on Coreg , Lasix , losartan , Aldactone .  Intermittent noncompliance  Hyperlipidemia: Continue Crestor .   Diabetes mellitus type 2:  last A1c of 5.4. refuses monitoring    Polysubstance abuse: UDS was positive for cocaine and benzos.   Deconditioning: Ambulatory status complicated by previous right AKA and left BKA has no insurance coverage for SNF. - refusing therapy at times   History of COPD: Continue inhalers   History of DVT: On Xarelto  but frequently refuses   Estimated body mass index is 20.09 kg/m as calculated from the following:   Height as of this encounter: 5' 9 (1.753 m).  Weight as of this encounter: 61.7 kg   Interval History:  In his room, didn't want to talk to me    Antimicrobials:   DVT prophylaxis:   rivaroxaban  (XARELTO ) tablet 20 mg   Code Status:   Code Status: Full Code  Mobility Assessment (Last 72 Hours)     Mobility Assessment     Row Name 05/30/24 0800 05/29/24 2100 05/29/24 1600 05/29/24 1200 05/29/24 0900   Does patient have an order for bedrest or is patient medically unstable No - Continue assessment No - Continue assessment Yes- Bedfast (Level 1) - Complete Yes- Bedfast (Level 1) - Complete Yes- Bedfast (Level 1) - Complete   What is the highest level of mobility based on the progressive mobility assessment? Level 2 (Chairfast) - Balance while sitting on edge of bed and cannot stand Level 2 (Chairfast) - Balance while sitting on edge of bed and cannot stand Level 2 (Chairfast) - Balance while sitting on edge of bed and cannot stand Level 2 (Chairfast) - Balance while sitting on edge of bed and cannot stand Level 2 (Chairfast) - Balance while sitting on edge of bed and cannot stand   Is the above level different from baseline mobility prior to current illness? Yes - Recommend PT order -- -- -- --    Row Name 05/28/24 2000 05/28/24 1058 05/28/24 0908 05/28/24 0819 05/27/24 2000   Does patient have an order for bedrest or is patient medically unstable No - Continue assessment No - Continue assessment No - Continue assessment No - Continue assessment No - Continue assessment   What is the highest level of mobility based on the progressive mobility assessment? Level 2 (Chairfast) - Balance while sitting on edge of bed and cannot stand Level 2 (Chairfast) - Balance while sitting on edge of bed and cannot stand -- Level 2 (Chairfast) - Balance while sitting on edge of bed and cannot stand Level 2 (Chairfast) - Balance while sitting on edge of bed and cannot stand   Is the above level different from baseline mobility prior to current illness? Yes - Recommend PT order -- -- -- Yes - Recommend PT order      Barriers to discharge: APS to obtain  guardianship Disposition Plan:  TBD HH orders placed: n/a Status is: Inpt  Objective: Blood pressure 119/62, pulse 68, temperature 98.5 F (36.9 C), temperature source Oral, resp. rate 20, height 5' 9 (1.753 m), weight 61.7 kg, SpO2 98%.  Examination:  General: NAD Lungs: Normal respiratory effort Extremities: Left BKA, Right AKA   Consultants:  Psych      LOS: 87 days   [name], MD [facility] 05/30/2024, 1:18 PM

## 2024-05-30 NOTE — TOC Progression Note (Signed)
 Transition of Care [facility]) - Progression Note    Patient Details  Name: [name] MRN: [id number] Date of Birth: Jul 04, 1966  [facility] of [facility] CM/SW Contact  [name], [name] Phone Number: 05/30/2024, 4:30 PM  Clinical Narrative:   CSW met with patient and Robertson Colclough for them to complete disability renewal paperwork. Patient also indicated that he wanted to fire his lawyer, but he was referencing the guardian ad litem assigned to him, as he has not even met with him, can't speak with him, and he is the reason that the guardianship hearing got rescheduled, so he doesn't feel that he is doing anything for him. CSW indicated that she isn't sure how to fix that but will see what she can do. Disability paperwork taken to the post office to be postmarked today. Patient provided with update that paperwork was sent. CSW to follow.    Expected Discharge Plan: Skilled Nursing Facility Barriers to Discharge: English as a second language teacher, Active Substance Use - Placement, SNF Pending bed offer  Expected Discharge Plan and Services       Living arrangements for the past 2 months: Apartment Expected Discharge Date: 03/14/24                                     Social Determinants of Health (SDOH) Interventions SDOH Screenings   Food Insecurity: Food Insecurity Present (03/06/2024)  Housing: High Risk (03/06/2024)  Transportation Needs: No Transportation Needs (03/06/2024)  Utilities: Patient Declined (03/06/2024)  Alcohol Screen: Medium Risk (06/24/2019)  Depression (PHQ2-9): Low Risk  (02/18/2024)  Recent Concern: Depression (PHQ2-9) - Medium Risk (01/21/2024)  Financial Resource Strain: High Risk (07/24/2021)   Received from [facility]  Physical Activity: Inactive (02/18/2024)  Social Connections: Unknown (04/05/2022)   [facility] from [facility]  Stress: Stress Concern Present (07/24/2021)   Received from [facility]  Tobacco Use: High Risk (03/04/2024)  Health  Literacy: Adequate Health Literacy (02/18/2024)    Readmission Risk Interventions    12/21/2023    9:25 AM 12/09/2022    9:02 AM 12/01/2022    3:56 PM  Readmission Risk Prevention Plan  Transportation Screening Complete Complete Complete  HRI or Home Care Consult Complete    Social Work Consult for Recovery Care Planning/Counseling Complete    Palliative Care Screening Not Applicable    Medication Review Oceanographer) Complete Complete Complete  PCP or Specialist appointment within 3-5 days of discharge  Complete Complete  HRI or Home Care Consult  Complete Complete  SW Recovery Care/Counseling Consult  Complete Complete  Palliative Care Screening  Not Applicable Not Applicable  Skilled Nursing Facility  Not Applicable Not Applicable  SNF Comments   n/a

## 2024-05-30 NOTE — Plan of Care (Signed)
 Requested to speak with his Case manager.  I did tell Vertell that he wanted to speak with her on last Friday and she told me to tell him she would come and see him this morning and she did.  He was very happy with his conversation with her, as he feels that he has some concerns that he wants to discuss and just wanted a private conversation with her.  Rolling around the unit in his w/c and has been much nicer and friendlier.    Problem: Safety: Goal: Ability to remain free from injury will improve 05/30/2024 1548 by Karlene Grayce GRADE, RN Outcome: Progressing 05/30/2024 1548 by Karlene Grayce GRADE, RN Outcome: Progressing   Problem: Skin Integrity: Goal: Risk for impaired skin integrity will decrease 05/30/2024 1548 by Karlene Grayce GRADE, RN Outcome: Progressing 05/30/2024 1548 by Karlene Grayce GRADE, RN Outcome: Progressing   Problem: Coping: Goal: Level of anxiety will decrease 05/30/2024 1548 by Karlene Grayce GRADE, RN Outcome: Progressing 05/30/2024 1548 by Karlene Grayce GRADE, RN Outcome: Progressing

## 2024-05-30 NOTE — Plan of Care (Signed)
  Problem: Nutrition: Goal: Adequate nutrition will be maintained Outcome: Progressing   Problem: Coping: Goal: Level of anxiety will decrease Outcome: Progressing   Problem: Elimination: Goal: Will not experience complications related to bowel motility Outcome: Progressing Goal: Will not experience complications related to urinary retention Outcome: Progressing   Problem: Pain Managment: Goal: General experience of comfort will improve and/or be controlled Outcome: Progressing   Problem: Safety: Goal: Ability to remain free from injury will improve Outcome: Progressing   Problem: Skin Integrity: Goal: Risk for impaired skin integrity will decrease Outcome: Progressing

## 2024-05-30 NOTE — Progress Notes (Signed)
 PT Cancellation Note  Patient Details Name: Chong Wojdyla MRN: [id number] DOB: 12-19-1965   Cancelled Treatment:    Reason Eval/Treat Not Completed: Other (comment)  Attempted PT session, however Carlon was sleeping, and I opted not to disturb him too much; was able to bring a drop-arm 3in1 and set I t up in his bathroom;   [name], PT  Acute Rehabilitation Services Office [phone] Secure Chat welcomed    [name] 05/30/2024, 1:25 PM

## 2024-05-31 DIAGNOSIS — I639 Cerebral infarction, unspecified: Secondary | ICD-10-CM | POA: Diagnosis not present

## 2024-05-31 NOTE — Progress Notes (Signed)
 Physical Therapy Treatment Patient Details Name: [name] MRN: [id number] DOB: 1966/04/10 Today's Date: 05/31/2024   History of Present Illness Patient is 58 y.o. male presented to ED on 03/04/24 with stroke like symptoms when caregiver found him with slurred speech and right sided weakness. Head CT reveals Acute left MCA territory infarct affecting the insula, caudate nucleus, lentiform nucleus and internal capsule. PMH significant of  Schizoaffective disorder, heart failure with reduced ejection fraction, pretension, DMII, COPD, s/p bilateral amputations, chronic pain, and polysubstance abuse.    PT Comments  Continuing work on functional mobility and activity tolerance;  Session focused on functional transfers and wheelchair use, and this PT was pleasantly surprised that [name] agreed to demo a WC><toilet transfer, and then demonstrated the transfer to and from the commode in his small hospital bathroom rather well; Supervision for safety with the transfer today, and he did not need physical assist; if pressed, I would be OK with [name] performing this transfer independently; He does not need the drop-arm BSC I placed in his room yesterday;   We then shifted to visiting a few other units in the building (and [name] joined me as I returned the drop-arm bSC to the storage room where I found it); [name] paused and spoke to folks he has worked with before, and seemed to enjoy being out of his room; During our roll around hospital, we saw our RN [name], who is familiar to [name]; in discussing her role, we touched on how important it is to keep BP under control for prevention of stroke, kidney injury etc   If plan is discharge home, recommend the following: Supervision due to cognitive status;Direct supervision/assist for financial management;Direct supervision/assist for medications management   Can travel by private vehicle      (perhaps soon)  Equipment Recommendations  None  recommended by PT    Recommendations for Other Services       Precautions / Restrictions Precautions Precautions: Fall Recall of Precautions/Restrictions: Impaired Precaution/Restrictions Comments: h/o BLE amputations Restrictions Weight Bearing Restrictions Per Provider Order: No     Mobility  Bed Mobility               General bed mobility comments: Pt in w/c on arrival    Transfers Overall transfer level: Needs assistance Equipment used:  (pt's own wheelchair) Transfers: Bed to chair/wheelchair/BSC            Lateral/Scoot Transfers: Supervision, Modified independent (Device/Increase time) General transfer comment: Transferred wc to/from toilet in his hosptial room via lateral scoot with good press up and use of UEs; did not need a drop arm BSC for the transfer    Ambulation/Gait                   Psychologist, counselling mobility: Yes Wheelchair propulsion: Both upper extremities Wheelchair parts: Independent Distance: greater than 600 ft Wheelchair Assistance Details (indicate cue type and reason): We rolled from pt's room to [facility], then from [name] on 5th floor to 5c, and back to [name], to 3rd floor, and back to room; Needed light mod cues for pathfinding; slow speed; Most notable that with his own wheelchair he naturally repositions himself, and performs small chair push ups   Tilt Bed    Modified Rankin (Stroke Patients Only) Modified Rankin (Stroke Patients Only) Pre-Morbid Rankin Score: Slight disability Modified Rankin: Moderate disability  Balance     Sitting balance-Leahy Scale: Good                                      Communication Communication Communication: Impaired Factors Affecting Communication: Difficulty expressing self;Reduced clarity of speech (occasional word-finding difficulty)  Cognition Arousal: Alert Behavior  During Therapy: WFL for tasks assessed/performed                             Following commands: Intact Following commands impaired: Follows one step commands inconsistently, Follows one step commands with increased time (Responds better when commands are presented more like invitations)    Cueing Cueing Techniques: Verbal cues  Exercises      General Comments General comments (skin integrity, edema, etc.): During our roll around hospital, we saw our RN [name], who is familiar to [place]; in discussing her role, we touched on how important it is to keep BP under control for prevention of stroke, kidney injury etc; when asked about his BP medication, [name] mentioned he takes gabapentin       Pertinent Vitals/Pain Pain Assessment Pain Assessment: No/denies pain Pain Intervention(s): Monitored during session    Home Living                          Prior Function            PT Goals (current goals can now be found in the care plan section) Acute Rehab PT Goals PT Goal Formulation: With patient Time For Goal Achievement: 06/06/24 Potential to Achieve Goals: Fair Progress towards PT goals: Progressing toward goals    Frequency    Min 2X/week      PT Plan      Co-evaluation              AM-PAC PT 6 Clicks Mobility   Outcome Measure  Help needed turning from your back to your side while in a flat bed without using bedrails?: None Help needed moving from lying on your back to sitting on the side of a flat bed without using bedrails?: None Help needed moving to and from a bed to a chair (including a wheelchair)?: A Little Help needed standing up from a chair using your arms (e.g., wheelchair or bedside chair)?: Total Help needed to walk in hospital room?: Total Help needed climbing 3-5 steps with a railing? : Total 6 Click Score: 14    End of Session   Activity Tolerance: Patient tolerated treatment well Patient left: Other  (comment) (on 3W unit, talkign with Emergency planning/management officer) Nurse Communication: Mobility status PT Visit Diagnosis: Other abnormalities of gait and mobility (R26.89);Muscle weakness (generalized) (M62.81);Other symptoms and signs involving the nervous system (R29.898);Hemiplegia and hemiparesis Hemiplegia - Right/Left: Right Hemiplegia - dominant/non-dominant: Dominant Hemiplegia - caused by: Cerebral infarction     Time: 8586-8554 PT Time Calculation (min) (ACUTE ONLY): 32 min  Charges:    $Therapeutic Activity: 23-37 mins PT General Charges $$ ACUTE PT VISIT: 1 Visit                     [name], PT  Acute Rehabilitation Services Office [phone] Secure Chat welcomed    [name] 05/31/2024, 3:55 PM

## 2024-05-31 NOTE — Plan of Care (Signed)
 Today did refuse to have vitals taken at noon and even refuses temp much of the time because said the temp probes are nasty and they are contaminated.  I even offered him a disposable thermometer and he refused.  He had been taking all of his meds for me but today refused to take his diuretics.  MD is aware, as he does refuse to take them daily but hand not been the case for me.     Problem: Nutrition: Goal: Adequate nutrition will be maintained Outcome: Progressing   Problem: Coping: Goal: Level of anxiety will decrease Outcome: Progressing   Problem: Elimination: Goal: Will not experience complications related to bowel motility Outcome: Progressing Goal: Will not experience complications related to urinary retention Outcome: Progressing   Problem: Pain Managment: Goal: General experience of comfort will improve and/or be controlled Outcome: Progressing   Problem: Safety: Goal: Ability to remain free from injury will improve Outcome: Progressing   Problem: Skin Integrity: Goal: Risk for impaired skin integrity will decrease Outcome: Progressing

## 2024-05-31 NOTE — Progress Notes (Signed)
 Progress Note    Urbano Milhouse   FMW:[id number]  DOB: 1966/10/27  DOA: 03/04/2024     89 PCP: Tanda Bleacher, MD  Initial CC: Slurred speech, confusion  Hospital Course: 58 year old with history significant for COPD, cocaine abuse, presented to the ED 03/04/24 for slower speech, confusion and right-sided weakness, CT head showed left MCA infarct, CTA head and neck revealed an M2 occlusion on the left,   Echocardiogram revealed large mobile left ventricular thrombus on the left and right ventricular apical thrombus in the right, ejection fraction less than 20%.  Cardiology was consulted recommended DOAC.  PT evaluated patient and recommended skilled nursing facility placement.  Patient was found to lack medical decision capacity/status and not amenable to determining his discharge disposition or leave AGAINST MEDICAL ADVICE.   He intermittently declines physical exams and medications. TOC is pursuing guardianship with APS as he is lacking medical decision capacity. Psychiatry presently recommending to await APS decision for guardianship before discharging home.  Patient does not qualify for forced medication administration   No changes in medical condition.   Assessment & Plan:  Principal Problem:   Acute CVA (cerebrovascular accident) (HCC) Active Problems:   LV (left ventricular) mural thrombus   Chronic systolic CHF (congestive heart failure) (HCC)   Prolonged QT interval   Elevated liver enzymes   COPD (chronic obstructive pulmonary disease) (HCC)   Cocaine abuse (HCC)   Schizoaffective disorder, bipolar type (HCC)   Benign essential HTN   Acid reflux   History of DVT (deep vein thrombosis)   Type 2 diabetes mellitus (HCC)   Status post bilateral BKA.   Dilated cardiomyopathy (HCC)    Acute CVA (cerebrovascular accident) (HCC) -He underwent extensive workup and imaging revealed left MCA territory infarct, caudate nucleus, lentiform nucleus and internal capsule MRI  confirmed. -TEE was noted for large mobile left lower clot just under the mitral valve with an EF of 20% and a small right apical clot with reduced RV systolic function. -He was started on Eliquis  for intracardiac thrombus, however he remained noncompliant.  In order to improve compliance, medicine changed to Xarelto  for once daily dosing -Psychiatry has deemed the patient to lack capacity; he continues to remain noncompliant at times -Unable to force meds.  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  Eliquis  changed to Xarelto , Coreg  long-acting.   Schizoaffective disorder bipolar disorder lacks medical capacity: Psych was consulted and recommended that the patient lacks medical capacity to make decisions if the patient attempts to leave AGAINST MEDICAL ADVICE he should be involuntarily committed. Psych recommending APS guardianship, TOC in touch with APS regarding this as patient lacks medical capacity   Essential hypertension:  Pressures are labile.  Currently on Coreg , Lasix , losartan , Aldactone .  Intermittent noncompliance  Hyperlipidemia: Continue Crestor .   Diabetes mellitus type 2:  last A1c of 5.4. refuses monitoring    Polysubstance abuse: UDS was positive for cocaine and benzos.   Deconditioning: Ambulatory status complicated by previous right AKA and left BKA has no insurance coverage for SNF. - refusing therapy at times   History of COPD: Continue inhalers   History of DVT: On Xarelto  but frequently refuses   Estimated body mass index is 20.09 kg/m as calculated from the following:   Height as of this encounter: 5' 9 (1.753 m).  Weight as of this encounter: 61.7 kg   Interval History:  Moving around in the unit on wheelchair.   Antimicrobials:   DVT prophylaxis:   rivaroxaban  (XARELTO ) tablet 20 mg   Code Status:   Code Status: Full Code  Mobility Assessment (Last 72 Hours)     Mobility Assessment     Row Name 05/31/24 0800 05/30/24 2138 05/30/24 0800 05/29/24 2100 05/29/24 1600   Does patient have an order for bedrest or is patient medically unstable No - Continue assessment No - Continue assessment No - Continue assessment No - Continue assessment Yes- Bedfast (Level 1) - Complete   What is the highest level of mobility based on the progressive mobility assessment? Level 2 (Chairfast) - Balance while sitting on edge of bed and cannot stand Level 2 (Chairfast) - Balance while sitting on edge of bed and cannot stand Level 2 (Chairfast) - Balance while sitting on edge of bed and cannot stand Level 2 (Chairfast) - Balance while sitting on edge of bed and cannot stand Level 2 (Chairfast) - Balance while sitting on edge of bed and cannot stand   Is the above level different from baseline mobility prior to current illness? Yes - Recommend PT order Yes - Recommend PT order Yes - Recommend PT order -- --    Row Name 05/29/24 1200 05/29/24 0900 05/28/24 2000       Does patient have an order for bedrest or is patient medically unstable Yes- Bedfast (Level 1) - Complete Yes- Bedfast (Level 1) - Complete No - Continue assessment     What is the highest level of mobility based on the progressive mobility assessment? Level 2 (Chairfast) - Balance while sitting on edge of bed and cannot stand Level 2 (Chairfast) - Balance while sitting on edge of bed and cannot stand Level 2 (Chairfast) - Balance while sitting on edge of bed and cannot stand     Is the above level different from baseline mobility prior to current illness? -- -- Yes - Recommend PT order        Barriers to discharge: APS to obtain guardianship Disposition Plan:  TBD HH orders placed: n/a Status is: Inpt  Objective: Blood pressure (!) 144/104, pulse 70,  temperature 98.5 F (36.9 C), temperature source Oral, resp. rate 16, height 5' 9 (1.753 m), weight 61.7 kg, SpO2 99%.  Examination:  General:  NAD Lungs: Normal Resp Effort Extremities: Left BKA, Right AKA   Consultants:  Psych      LOS: 88 days   [name], MD [facility] 05/31/2024, 1:09 PM

## 2024-06-01 DIAGNOSIS — I639 Cerebral infarction, unspecified: Secondary | ICD-10-CM | POA: Diagnosis not present

## 2024-06-01 NOTE — Plan of Care (Signed)
 Pt did refuse taking of vitals signs as well as the due medications. MD is aware. Problem: Nutrition: Goal: Adequate nutrition will be maintained Outcome: Progressing   Problem: Elimination: Goal: Will not experience complications related to bowel motility Outcome: Progressing   Problem: Safety: Goal: Ability to remain free from injury will improve Outcome: Progressing   Problem: Skin Integrity: Goal: Risk for impaired skin integrity will decrease Outcome: Progressing   Problem: Elimination: Goal: Will not experience complications related to bowel motility Outcome: Progressing

## 2024-06-01 NOTE — Progress Notes (Signed)
 Patient refused night medications.

## 2024-06-01 NOTE — Progress Notes (Signed)
 OT Cancellation Note  Patient Details Name: [name] MRN: [id number] DOB: Mar 04, 1966   Cancelled Treatment:    Reason Eval/Treat Not Completed: Patient declined, no reason specified (Asked pt about participating in therapy today while he was in hallway. Pt declined working with therapy. OT to follow-up with pt as able.)  06/01/2024  AB, OTR/L  Acute Rehabilitation Services  Office: [phone]   Curtistine JONETTA Das 06/01/2024, 3:07 PM

## 2024-06-01 NOTE — Progress Notes (Signed)
 Progress Note    [name]   FMW:[id number]  DOB: 12/29/1965  DOA: 03/04/2024     89 PCP: Tanda Bleacher, MD  Initial CC: Slurred speech, confusion  Hospital Course: 58 year old with history significant for COPD, cocaine abuse, presented to the ED 03/04/24 for slower speech, confusion and right-sided weakness, CT head showed left MCA infarct, CTA head and neck revealed an M2 occlusion on the left,   Echocardiogram revealed large mobile left ventricular thrombus on the left and right ventricular apical thrombus in the right, ejection fraction less than 20%.  Cardiology was consulted recommended DOAC.  PT evaluated patient and recommended skilled nursing facility placement.  Patient was found to lack medical decision capacity/status and not amenable to determining his discharge disposition or leave AGAINST MEDICAL ADVICE.   He intermittently declines physical exams and medications. TOC is pursuing guardianship with APS as he is lacking medical decision capacity. Psychiatry presently recommending to await APS decision for guardianship before discharging home.  Patient does not qualify for forced medication administration   No changes in medical condition.   Assessment & Plan:  Principal Problem:   Acute CVA (cerebrovascular accident) (HCC) Active Problems:   LV (left ventricular) mural thrombus   Chronic systolic CHF (congestive heart failure) (HCC)   Prolonged QT interval   Elevated liver enzymes   COPD (chronic obstructive pulmonary disease) (HCC)   Cocaine abuse (HCC)   Schizoaffective disorder, bipolar type (HCC)   Benign essential HTN   Acid reflux   History of DVT (deep vein thrombosis)   Type 2 diabetes mellitus (HCC)   Status post bilateral BKA.   Dilated cardiomyopathy (HCC)    Acute CVA (cerebrovascular accident) (HCC) -He underwent extensive workup and imaging revealed left MCA territory infarct, caudate nucleus, lentiform nucleus and internal capsule MRI  confirmed. -TEE was noted for large mobile left lower clot just under the mitral valve with an EF of 20% and a small right apical clot with reduced RV systolic function. -He was started on Eliquis  for intracardiac thrombus, however he remained noncompliant.  In order to improve compliance, medicine changed to Xarelto  for once daily dosing -Psychiatry has deemed the patient to lack capacity; he continues to remain noncompliant at times -Unable to force meds.  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  Eliquis  changed to Xarelto , Coreg  long-acting.   Schizoaffective disorder bipolar disorder lacks medical capacity: Psych was consulted and recommended that the patient lacks medical capacity to make decisions if the patient attempts to leave AGAINST MEDICAL ADVICE he should be involuntarily committed. Psych recommending APS guardianship, TOC in touch with APS regarding this as patient lacks medical capacity   Essential hypertension:  Pressures are labile.  Currently on Coreg , Lasix , losartan , Aldactone .  Intermittent noncompliance  Hyperlipidemia: Continue Crestor .   Diabetes mellitus type 2:  last A1c of 5.4. refuses monitoring    Polysubstance abuse: UDS was positive for cocaine and benzos.   Deconditioning: Ambulatory status complicated by previous right AKA and left BKA has no insurance coverage for SNF. - refusing therapy at times   History of COPD: Continue inhalers   History of DVT: On Xarelto  but frequently refuses   Estimated body mass index is 20.09 kg/m as calculated from the following:   Height as of this encounter: 5' 9 (1.753 m).  Weight as of this encounter: 61.7 kg   Interval History:  No complaints.   Antimicrobials:   DVT prophylaxis:    rivaroxaban  (XARELTO ) tablet 20 mg   Code Status:   Code Status: Full Code  Mobility Assessment (Last 72 Hours)     Mobility Assessment     Row Name 05/31/24 2000 05/31/24 1500 05/31/24 0800 05/30/24 2138 05/30/24 0800   Does patient have an order for bedrest or is patient medically unstable No - Continue assessment -- No - Continue assessment No - Continue assessment No - Continue assessment   What is the highest level of mobility based on the progressive mobility assessment? Level 2 (Chairfast) - Balance while sitting on edge of bed and cannot stand Level 2 (Chairfast) - Balance while sitting on edge of bed and cannot stand Level 2 (Chairfast) - Balance while sitting on edge of bed and cannot stand Level 2 (Chairfast) - Balance while sitting on edge of bed and cannot stand Level 2 (Chairfast) - Balance while sitting on edge of bed and cannot stand   Is the above level different from baseline mobility prior to current illness? Yes - Recommend PT order -- Yes - Recommend PT order Yes - Recommend PT order Yes - Recommend PT order    Row Name 05/29/24 2100 05/29/24 1600 05/29/24 1200       Does patient have an order for bedrest or is patient medically unstable No - Continue assessment Yes- Bedfast (Level 1) - Complete Yes- Bedfast (Level 1) - Complete     What is the highest level of mobility based on the progressive mobility assessment? Level 2 (Chairfast) - Balance while sitting on edge of bed and cannot stand Level 2 (Chairfast) - Balance while sitting on edge of bed and cannot stand Level 2 (Chairfast) - Balance while sitting on edge of bed and cannot stand     Is the above level different from baseline mobility prior to current illness? -- -- --        Barriers to discharge: APS to obtain guardianship Disposition Plan:  TBD HH orders placed: n/a Status is: Inpt  Objective: Blood pressure (!) 158/100, pulse 72, temperature 98.5 F (36.9 C), temperature source Oral, resp. rate 18,  height 5' 9 (1.753 m), weight 61.7 kg, SpO2 99%.  Examination:  General:  NAD Lungs: Normal resp effort Extremities: Left BKA, Right AKA   Consultants:  Psych      LOS: 89 days   [name], MD [facility] 06/01/2024, 10:54 AM

## 2024-06-01 NOTE — Plan of Care (Signed)
 Informed Dr Regalado that pt is refusing to have his vital signs taken often and almost always refuses to allow his temp to be taken.  Said he will not put the thermometer in his mouth because it is shared with everyone, although I explained that there are protective probe cover caps on the tips.  He still said he will not allow that.  I even went to the storage room and got him a disposable thermometer for his own personal use and asked if he would allow his temp to be taken that way and he said no at first but agreed when I asked what if he would just take his own temp when we do his vital signs and allow us  to see it so we can record it and he said ok but today refused again.  Dr is aware that he refuses vitals and his meds as well.  He is, however, much friendlier and does come out of his room and interact with others since he has his own w/c.    Problem: Coping: Goal: Level of anxiety will decrease Outcome: Progressing   Problem: Safety: Goal: Ability to remain free from injury will improve Outcome: Progressing   Problem: Skin Integrity: Goal: Risk for impaired skin integrity will decrease Outcome: Progressing   Problem: Elimination: Goal: Will not experience complications related to urinary retention Outcome: Progressing   Problem: Nutrition: Goal: Adequate nutrition will be maintained Outcome: Progressing

## 2024-06-02 DIAGNOSIS — I639 Cerebral infarction, unspecified: Secondary | ICD-10-CM | POA: Diagnosis not present

## 2024-06-02 NOTE — Progress Notes (Signed)
 Speech Language Pathology Treatment: Cognitive-Linguistic  Patient Details Name: [name] MRN: [id number] DOB: Apr 22, 1966 Today's Date: 06/02/2024 Time: 8567-8541 SLP Time Calculation (min) (ACUTE ONLY): 26 min  Assessment / Plan / Recommendation Clinical Impression  Pt was not interested in working on medication management tasks today, but was open to other tasks addressing cognition and communication. He engaged in reading task that addressed both receptive and expressive language, needing Min cues for language as well as use of speech intelligibility strategies. After the first ~10 minutes, pt's sustained attention started to wane. He needed Mod cues for sustained attention at that point, as well as assistance with scanning to stay on the correct line. Pt even started to doze off at the very end of the task, but acknowledged that he was no longer attentive. Pt verbalized that he wants to continue to work on his attention in future sessions.     HPI HPI: Patient is 58 y.o. male presented to ED on 03/04/24 with stroke like symptoms when caregiver found him with slurred speech and right sided weakness. Head CT reveals Acute left MCA territory infarct affecting the insula, caudate nucleus, lentiform nucleus and internal capsule. Pt initially evaluated by SLP 4/5 but s/o 4/22 due to repeated refusals. SLP reordered 5/13 for repeat cognitive-linguistic eval.   PMH significant of  Schizoaffective disorder, heart failure with reduced ejection fraction, pretension, DMII, COPD, s/p bilateral amputations, chronic pain, and polysubstance abuse.      SLP Plan  Continue with current plan of care          Recommendations                         Intermittent Supervision/Assistance Dysarthria and anarthria (R47.1);Cognitive communication deficit (R41.841)     Continue with current plan of care     Leita SAILOR., M.A. CCC-SLP Acute Rehabilitation Services Office: [phone]  Secure  chat preferred   06/02/2024, 3:05 PM

## 2024-06-02 NOTE — TOC Progression Note (Signed)
 Transition of Care [facility]) - Progression Note    Patient Details  Name: [name] MRN: [id number] Date of Birth: [birth year]  [facility] CM/SW Contact  [name], [name] Phone Number: 06/02/2024, 3:45 PM  Clinical Narrative:   CSW met with patient to discuss concerns related to wanting to replace his GAL. CSW explained information that was obtained via research and explained that trying to get a new GAL appointed may delay the guardianship process, and patient in agreement to try to work with the current GAL until the next scheduled court date as he does not want to delay the process more.   Patient also expressed concerns with wanting to meet with [place] with [name], requested that CSW assist in contacting him on what's going on. Patient also wants his apartment key back from [name] with [name], he is frustrated that no one has gotten the key back from him; patient expressed concern that they may take advantage of it.   Lengthy discussion with patient answering questions and discussing concerns. Patient seemed appreciative of CSW time and assistance.    Expected Discharge Plan: Skilled Nursing Facility Barriers to Discharge: English as a second language teacher, Active Substance Use - Placement, SNF Pending bed offer  Expected Discharge Plan and Services       Living arrangements for the past 2 months: Apartment Expected Discharge Date: 03/14/24                                     Social Determinants of Health (SDOH) Interventions SDOH Screenings   Food Insecurity: Food Insecurity Present (03/06/2024)  Housing: High Risk (03/06/2024)  Transportation Needs: No Transportation Needs (03/06/2024)  Utilities: Patient Declined (03/06/2024)  Alcohol Screen: Medium Risk (06/24/2019)  Depression (PHQ2-9): Low Risk  (02/18/2024)  Recent Concern: Depression (PHQ2-9) - Medium Risk (01/21/2024)  Financial Resource Strain: High Risk (07/24/2021)   Received from [facility]  Physical Activity: Inactive (02/18/2024)  Social Connections: Unknown (04/05/2022)   Received from [facility]  Stress: Stress Concern Present (07/24/2021)   Received from [facility]  Tobacco Use: High Risk (03/04/2024)  Health Literacy: Adequate Health Literacy (02/18/2024)    Readmission Risk Interventions    12/21/2023    9:25 AM 12/09/2022    9:02 AM 12/01/2022    3:56 PM  Readmission Risk Prevention Plan  Transportation Screening Complete Complete Complete  HRI or Home Care Consult Complete    Social Work Consult for Recovery Care Planning/Counseling Complete    Palliative Care Screening Not Applicable    Medication Review Oceanographer) Complete Complete Complete  PCP or Specialist appointment within 3-5 days of discharge  Complete Complete  HRI or Home Care Consult  Complete Complete  SW Recovery Care/Counseling Consult  Complete Complete  Palliative Care Screening  Not Applicable Not Applicable  Skilled Nursing Facility  Not Applicable Not Applicable  SNF Comments   n/a

## 2024-06-02 NOTE — Plan of Care (Signed)
 Still refuses to allow staff to take his temperature. This RN did try to get him to take temp with his own disposable thermometer but he still refused. Doctors are aware, as this is daily.  He often refuses to allow vitals to be taken as well and has to be reminded that I cannot safely give BP meds until I can know what his BP is.  Problem: Coping: Goal: Level of anxiety will decrease Outcome: Progressing   Problem: Safety: Goal: Ability to remain free from injury will improve Outcome: Progressing   Problem: Skin Integrity: Goal: Risk for impaired skin integrity will decrease Outcome: Progressing   Problem: Elimination: Goal: Will not experience complications related to bowel motility Outcome: Progressing

## 2024-06-02 NOTE — Progress Notes (Signed)
 [facility] PROGRESS NOTE    Progress Note  [name]  FMW:[id number] DOB: 04/09/1966 DOA: 03/04/2024 PCP: Tanda Bleacher, MD     Brief Narrative:   [name] is an 58 y.o. male with history significant for COPD, cocaine abuse, presented to the ED 03/04/24 for slower speech, confusion and right-sided weakness, CT head showed left MCA infarct, CTA head and neck revealed an M2 occlusion on the left,   Echocardiogram revealed large mobile left ventricular thrombus on the left and right ventricular apical thrombus in the right, ejection fraction less than 20%.  Cardiology was consulted recommended DOAC.  PT evaluated patient and recommended skilled nursing facility placement.  Patient was found to lack medical decision capacity/status and not amenable to determining his discharge disposition or leave AGAINST MEDICAL ADVICE.   He intermittently declines physical exams and medications. TOC is pursuing guardianship with APS as he is lacking medical decision capacity. Psychiatry presently recommending to await APS decision for guardianship before discharging home.  Patient does not qualify for forced medication administration   Assessment/Plan:   Acute CVA (cerebrovascular accident) (HCC) Underwent extensive workup and imaging.  TEE was noted for large mobile mass EF 20%.  Reduced right ventricular function. Cardiology was consulted recommended Xarelto , he has remained noncompliance with this medication as an outpatient. 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 Last 2D echo showed EF 20%. Continue Xarelto .  Schizoaffective disorder/bipolar disorder/lacks medical capacity: Psychiatry recommended APS guardianship as the patient lacks capacity to make medical decisions. Patient should not leave AGAINST MEDICAL ADVICE. Awaiting APS  guardianship.  Essential hypertension: He refuses medication intermittently, continue current regimen of Coreg  Lasix  losartan  and Aldactone .  Hyperlipidemia: Continue Crestor .  Diabetes mellitus type 2: Last A1c of 5.4.  Polysubstance abuse: Noted UDS was positive for cocaine.  Deconditioning: He refuses therapy.  History of DVT: Continue Xarelto    DVT prophylaxis: Xarelto  Family Communication:none Status is: Inpatient Remains inpatient appropriate because: Awaiting APS guardianship    Code Status:     Code Status Orders  (From admission, onward)           Start     Ordered   03/04/24 1733  Full code  Continuous       Question:  By:  Answer:  Consent: discussion documented in EHR   03/04/24 1733           Code Status History     Date Active Date Inactive Code Status Order ID Comments User Context   01/14/2024 0224 01/18/2024 2101 Full Code [id number]  Debby Camila LABOR, MD ED   01/02/2024 1725 01/06/2024 2117 Full Code [id number]  Claudene Maximino LABOR, MD ED   12/20/2023 0601 12/22/2023 1832 Full Code [id number]  Charlton Evalene RAMAN, MD ED   10/22/2023 1835 10/24/2023 1159 Full Code [id number]  Franklyn Sid SAILOR, MD ED   12/07/2022 1712 06/16/2023 1820 Full Code [id number]  Rockey Denece LABOR, DO ED   11/24/2022 0745 12/03/2022 1902 Full Code [id number]  Celinda Alm Lot, MD ED   11/03/2022 0253 11/03/2022 1937 Full Code [id number]  Jarold Olam HERO, PA-C ED   08/17/2022 0142 10/09/2022 2035 Full Code [id number]  Debby Camila LABOR, MD ED   07/12/2022 1026 07/25/2022 2113 Full Code [id number]  Celinda Alm Lot, MD ED   04/04/2022 1240 04/11/2022 1710 Full Code [id number]  Celinda Alm Lot, MD ED   04/03/2022 1411 04/04/2022 1240 Full Code  [id number]  Logan Ubaldo KATHEE DEVONNA ED   03/23/2022 2112 03/31/2022 1517 Full Code [id number]  Perri DELENA Meliton Mickey., MD Inpatient   02/26/2022 1812 03/07/2022 1908 Full Code [id number]  Rockey Denece DELENA, DO Inpatient   01/29/2022 1515 02/05/2022 1533 Full Code [id number]   Waddell Rake, MD ED   12/03/2021 0841 12/09/2021 1457 Full Code [id number]  Kenard Zachary PARAS, MD ED   11/24/2021 2315 11/25/2021 2004 Full Code [id number]  Muthersbaugh, Chiquita DEVONNA ED   11/13/2021 1653 11/21/2021 1748 Full Code [id number]  Celinda Alm Lot, MD Inpatient   11/02/2021 2000 11/04/2021 2331 Full Code [id number]  Charlyn Sora, MD ED   07/03/2021 1143 07/05/2021 0057 Full Code [id number]  Jerrol Agent, MD ED   03/02/2021 1936 03/12/2021 1811 Full Code [id number]  Tobie Jorie SAUNDERS, MD ED   02/10/2021 2345 02/13/2021 1958 Full Code [id number]  Jarold Olam HERO, PA-C ED   02/09/2021 1953 02/10/2021 1536 Full Code [id number]  Brien Therisa MATSU, MD ED   12/17/2020 1516 02/03/2021 1650 Full Code [id number]  Barbar Darice PARAS, NP ED   12/08/2020 1336 12/10/2020 1931 Full Code [id number]  Malvina Ellen, MD ED   11/13/2020 2028 11/16/2020 1520 Full Code [id number]  Rockey Denece DELENA, DO ED   10/26/2020 1039 10/31/2020 2157 Full Code [id number]  Kriste Emeline BRAVO, MD ED   10/15/2020 0611 10/18/2020 1810 Full Code [id number]  Chotiner, Adine RAMAN, MD Inpatient   10/02/2020 2247 10/07/2020 1809 Full Code [id number]  Charlton Evalene RAMAN, MD ED   09/25/2020 1559 09/26/2020 2002 Full Code [id number]  Tobie Mario GAILS, MD ED   09/09/2020 1251 09/11/2020 1546 Full Code [id number]  Rojelio Nest, DO Inpatient   09/07/2020 1753 09/09/2020 1251 DNR [id number]  Rockey Denece DELENA, DO Inpatient   07/21/2020 1700 07/23/2020 2001 Full Code [id number]  [name] Fonda POUR, MD ED   07/07/2020 1529 07/07/2020 2109 Full Code [id number]  Cottie Donnice PARAS, MD ED   06/24/2019 0131 06/24/2019 1630 Full Code [id number]  Court Selinda DELENA, NP Inpatient   06/24/2019 0130 06/24/2019 0131 Full Code [id number]  Debby Fortes, NP Inpatient   06/21/2019 0941 06/22/2019 2007 Full Code [id number]  Madelon Pizza, DO Inpatient   05/24/2019 1117 05/26/2019 1836 Full Code [id number]  Lanny Maxwell, MD ED   04/18/2019 0149 04/19/2019 1628 DNR [id number]  Kerman Soulier, MD ED   04/04/2019 0555  04/06/2019 1413 Full Code [id number]  Kelby Schatz, MD ED   03/27/2019 2130 03/29/2019 1900 Full Code [id number]  [name] Fonda POUR, MD ED   03/04/2019 1600 03/09/2019 1222 Full Code [id number]  Feliberto Cambric, MD ED   02/13/2018 0439 02/14/2018 1333 Full Code [id number]  Carita Senior, MD ED   12/26/2016 0547 12/29/2016 2021 Full Code [id number]  Kandyce Norleen DASEN, MD Inpatient   11/27/2016 1541 11/28/2016 1319 Full Code [id number]  Tracey Bruckner, PA-C ED   10/29/2016 1415 10/31/2016 1150 Full Code [id number]  Patsey Lot, MD ED   09/06/2016 2230 09/07/2016 1350 Full Code [id number]  Josem Bring, MD ED   09/06/2016 0934 09/06/2016 1529 Full Code [id number]  Nelta Jackye Saha, MD ED   09/02/2016 0045 09/04/2016 2127 Full Code [id number]  Terryl Kubas, NP ED   07/30/2016 1357 08/06/2016 2031 Full Code [id number]  Reva Majel RAMAN, FNP Inpatient   07/30/2016 0143 07/30/2016 1153 Full Code [id number]  Haze Bruckner PARAS, MD ED   05/30/2016 1622 06/05/2016 1454 Full Code [id number]  Nicholaus Leita DELENA, NP Inpatient   05/29/2016 1635 05/30/2016 1622 Full Code [id number]  Emil Share, DO ED   12/02/2011 0245 12/12/2011 0720 Full Code [id number]  Desiderio Chew, PA ED         IV Access:   Peripheral IV   Procedures and diagnostic studies:   No results found.   Medical Consultants:   None.   Subjective:    [name] no new complaints this morning  Objective:    Vitals:   05/31/24 0751 05/31/24 1600 06/01/24 0900 06/01/24 2000  BP:   (!) 158/100 (!) 169/127  Pulse:   72 77  Resp:   18 16  TempSrc:      SpO2: 99% 99%  100%  Weight:      Height:       SpO2: 100 % O2 Flow Rate (L/min): 2 L/min FiO2 (%): 28 %  No intake or output data in the 24 hours ending 06/02/24 0819 Filed Weights   03/26/24 0431 03/28/24 0456 04/04/24 0500  Weight: 42.7 kg 47.5 kg 61.7 kg    Exam: General exam: In no acute distress. Respiratory system: Good air movement and clear to auscultation. Cardiovascular system:  S1 & S2 heard, RRR. No JVD. Gastrointestinal system: Abdomen is nondistended, soft and nontender.  Extremities: No pedal edema. Skin: No rashes, lesions or ulcers Psychiatry: Poor judgment and insight of medical condition.   Data Reviewed:    Labs: Basic Metabolic Panel: No results for input(s): NA, K, CL, CO2, GLUCOSE, BUN, CREATININE, CALCIUM , MG, PHOS in the last 168 hours. GFR CrCl cannot be calculated (Patient's most recent lab result is older than the maximum 21 days allowed.). Liver Function Tests: No results for input(s): AST, ALT, ALKPHOS, BILITOT, PROT, ALBUMIN in the last 168 hours. No results for input(s): LIPASE, AMYLASE in the last 168 hours. No results for input(s): AMMONIA in the last 168 hours. Coagulation profile No results for input(s): INR, PROTIME in the last 168 hours. COVID-19 Labs  No results for input(s): DDIMER, FERRITIN, LDH, CRP in the last 72 hours.  Lab Results  Component Value Date   SARSCOV2NAA NEGATIVE 01/13/2024   SARSCOV2NAA NEGATIVE 01/02/2024   SARSCOV2NAA NEGATIVE 12/20/2023   SARSCOV2NAA NEGATIVE 11/23/2022    CBC: No results for input(s): WBC, NEUTROABS, HGB, HCT, MCV, PLT in the last 168 hours. Cardiac Enzymes: No results for input(s): CKTOTAL, CKMB, CKMBINDEX, TROPONINI in the last 168 hours. BNP (last 3 results) No results for input(s): PROBNP in the last 8760 hours. CBG: No results for input(s): GLUCAP in the last 168 hours. D-Dimer: No results for input(s): DDIMER in the last 72 hours. Hgb A1c: No results for input(s): HGBA1C in the last 72 hours. Lipid Profile: No results for input(s): CHOL, HDL, LDLCALC, TRIG, CHOLHDL, LDLDIRECT in the last 72 hours. Thyroid  function studies: No results for input(s): TSH, T4TOTAL, T3FREE, THYROIDAB in the last 72 hours.  Invalid input(s): FREET3 Anemia work up: No results for input(s):  VITAMINB12, FOLATE, FERRITIN, TIBC, IRON, RETICCTPCT in the last 72 hours. Sepsis Labs: No results for input(s): PROCALCITON, WBC, LATICACIDVEN in the last 168 hours. Microbiology No results found for this or any previous visit (from the past 240 hours).   Medications:    carvedilol   10 mg Oral Daily   furosemide   40 mg Oral Daily   gabapentin   200 mg Oral BID   losartan   100 mg Oral Daily   revefenacin   175 mcg Nebulization Daily   rivaroxaban   20 mg Oral Q supper   rosuvastatin   5 mg Oral QHS   spironolactone   25 mg Oral Daily   Continuous Infusions:    LOS: 90 days   [name]  [facility]  06/02/2024, 8:19 AM

## 2024-06-02 NOTE — Progress Notes (Signed)
 Occupational Therapy Treatment Patient Details Name: [name] MRN: [id number] DOB: 02-15-1966 Today's Date: 06/02/2024   History of present illness Patient is 58 y.o. male presented to ED on 03/04/24 with stroke like symptoms when caregiver found him with slurred speech and right sided weakness. Head CT reveals Acute left MCA territory infarct affecting the insula, caudate nucleus, lentiform nucleus and internal capsule. PMH significant of  Schizoaffective disorder, heart failure with reduced ejection fraction, pretension, DMII, COPD, s/p bilateral amputations, chronic pain, and polysubstance abuse.   OT comments  Patient received in wheelchair and asking to take shower. Tub bench provided to allow for transfer due to step into shower. Due to space issues wheelchair placement difficulty and patient required min assist for safety. Patient able to bathe self seated on shower bench with supervision. A-P transfer back to wheelchair with min assist due to wheelchair placement and wet skin on bench. Patient able to donn gown with min assist. Patient asking to perform wheelchair mobility on old unit and required frequent rest breaks due to fatigue from shower. Discharge recommendation continue to be appropriate. Acute OT to continue to follow to address established goals to facilitate DC to next venue of care.        If plan is discharge home, recommend the following:  A little help with walking and/or transfers;A little help with bathing/dressing/bathroom;Supervision due to cognitive status;Direct supervision/assist for financial management;Direct supervision/assist for medications management   Equipment Recommendations  None recommended by OT    Recommendations for Other Services      Precautions / Restrictions Precautions Precautions: Fall Recall of Precautions/Restrictions: Impaired Precaution/Restrictions Comments: h/o BLE amputations Restrictions Weight Bearing Restrictions Per Provider  Order: No       Mobility Bed Mobility Overal bed mobility: Modified Independent             General bed mobility comments: Pt in w/c on arrival    Transfers Overall transfer level: Needs assistance Equipment used: None Transfers: Bed to chair/wheelchair/BSC         Anterior-Posterior transfers: Min assist  Lateral/Scoot Transfers: Min assist General transfer comment: min assist for transfer to tub bench from wheelchair with lateral scoot due to difficulty with wheelchair placement due to space issue. A-P transfer from tub bench to wheelchair with min assist     Balance Overall balance assessment: Mild deficits observed, not formally tested Sitting-balance support: Bilateral upper extremity supported Sitting balance-Leahy Scale: Good Sitting balance - Comments: able to maintain sitting balance on tub bench                                   ADL either performed or assessed with clinical judgement   ADL Overall ADL's : Needs assistance/impaired     Grooming: Wash/dry hands;Independent;Sitting Grooming Details (indicate cue type and reason): chair level at sink Upper Body Bathing: Supervision/ safety;Sitting Upper Body Bathing Details (indicate cue type and reason): in shower Lower Body Bathing: Supervison/ safety;Sitting/lateral leans Lower Body Bathing Details (indicate cue type and reason): in shower Upper Body Dressing : Minimal assistance;Sitting Upper Body Dressing Details (indicate cue type and reason): to donn gown             Tub/ Shower Transfer: Minimal assistance;Shower Field seismologist Details (indicate cue type and reason): lateral scoot transfer from [facility] to tub bench into shower Functional mobility during ADLs: Supervision/safety;Wheelchair General ADL Comments: Patient performed shower transfer with tub bench requiring  min assist due to difficulty getting wheelchair in appropriate position    Extremity/Trunk Assessment               Vision       Perception     Praxis     Communication Communication Communication: Impaired Factors Affecting Communication: Difficulty expressing self;Reduced clarity of speech (occasional word finding difficulty)   Cognition Arousal: Alert Behavior During Therapy: WFL for tasks assessed/performed Cognition: Cognition impaired Difficult to assess due to: Impaired communication   Awareness: Intellectual awareness intact, Online awareness impaired Memory impairment (select all impairments): Working memory Attention impairment (select first level of impairment): Selective attention Executive functioning impairment (select all impairments): Problem solving OT - Cognition Comments: improvement with problem solving with transfer to tub bench                 Following commands: Intact Following commands impaired: Follows one step commands inconsistently, Follows one step commands with increased time      Cueing   Cueing Techniques: Verbal cues  Exercises Exercises: Other exercises Other Exercises Other Exercises: wheelchair mobility performed in halls    Shoulder Instructions       General Comments Wh    Pertinent Vitals/ Pain       Pain Assessment Pain Assessment: No/denies pain Pain Intervention(s): Monitored during session  Home Living                                          Prior Functioning/Environment              Frequency  Min 1X/week        Progress Toward Goals  OT Goals(current goals can now be found in the care plan section)  Progress towards OT goals: Progressing toward goals  Acute Rehab OT Goals Patient Stated Goal: to be discharged from hospital OT Goal Formulation: With patient Time For Goal Achievement: 06/25/24 Potential to Achieve Goals: Fair ADL Goals Pt Will Perform Lower Body Bathing: with mod assist;sitting/lateral leans Pt Will Perform Lower Body Dressing: with modified independence;bed  level Pt Will Transfer to Toilet: with mod assist;with transfer board;bedside commode Pt Will Perform Toileting - Clothing Manipulation and hygiene: with mod assist;sitting/lateral leans Pt/caregiver will Perform Home Exercise Program: Right Upper extremity;Independently;With written HEP provided (to increase coordination with use of RUE) Additional ADL Goal #1: Pt will transfer to w/c with Mod I as a precursor for OOB ADLs Additional ADL Goal #2: Pt will utilize RUE to gather 5/5 objects without overshooting. Additional ADL Goal #3: Pt will demonstrate proper safety with transfers/mobility 100% of the time during session.  Plan      Co-evaluation                 AM-PAC OT 6 Clicks Daily Activity     Outcome Measure   Help from another person eating meals?: None Help from another person taking care of personal grooming?: A Little Help from another person toileting, which includes using toliet, bedpan, or urinal?: A Lot Help from another person bathing (including washing, rinsing, drying)?: A Little Help from another person to put on and taking off regular upper body clothing?: A Little Help from another person to put on and taking off regular lower body clothing?: A Little 6 Click Score: 18    End of Session Equipment Utilized During Treatment: Other (comment) (w/c)  OT Visit Diagnosis: Unsteadiness  on feet (R26.81);Other abnormalities of gait and mobility (R26.89);Muscle weakness (generalized) (M62.81);Other symptoms and signs involving the nervous system (R29.898);Other symptoms and signs involving cognitive function;Hemiplegia and hemiparesis Hemiplegia - Right/Left: Right Hemiplegia - dominant/non-dominant: Dominant Hemiplegia - caused by: Cerebral infarction   Activity Tolerance Patient tolerated treatment well   Patient Left with call bell/phone within reach;in chair;Other (comment) (in wheelchair in room)   Nurse Communication Mobility status        Time:  8946-8846 OT Time Calculation (min): 60 min  Charges: OT General Charges $OT Visit: 1 Visit OT Treatments $Self Care/Home Management : 23-37 mins $Therapeutic Activity: 23-37 mins  [name], OTA Acute Rehabilitation Services  Office [phone]   [name] 06/02/2024, 2:04 PM

## 2024-06-03 DIAGNOSIS — I639 Cerebral infarction, unspecified: Secondary | ICD-10-CM | POA: Diagnosis not present

## 2024-06-03 NOTE — Progress Notes (Signed)
 [facility] PROGRESS NOTE    Progress Note  [name]  FMW:[id number] DOB: 09-11-1966 DOA: 03/04/2024 PCP: Tanda Bleacher, MD     Brief Narrative:   Derrion Tritz is an 58 y.o. male with history significant for COPD, cocaine abuse, presented to the ED 03/04/24 for slower speech, confusion and right-sided weakness, CT head showed left MCA infarct, CTA head and neck revealed an M2 occlusion on the left,   Echocardiogram revealed large mobile left ventricular thrombus on the left and right ventricular apical thrombus in the right, ejection fraction less than 20%.  Cardiology was consulted recommended DOAC.  PT evaluated patient and recommended skilled nursing facility placement.  Patient was found to lack medical decision capacity/status and not amenable to determining his discharge disposition or leave AGAINST MEDICAL ADVICE.   He intermittently declines physical exams and medications. TOC is pursuing guardianship with [facility]PS as he is lacking medical decision capacity. Psychiatry presently recommending to await APS decision for guardianship before discharging home.  Patient does not qualify for forced medication administration   Assessment/Plan:   Acute CVA (cerebrovascular accident) (HCC) Underwent extensive workup and imaging.  TEE was noted for large mobile mass EF 20%.  Reduced right ventricular function. Cardiology was consulted recommended Xarelto , he has remained noncompliance with this medication as an outpatient. 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 Last 2D echo showed EF 20%. Continue Xarelto .  Schizoaffective disorder/bipolar disorder/lacks medical capacity: Psychiatry recommended APS guardianship as the patient lacks capacity to make medical decisions. Patient should not leave AGAINST MEDICAL ADVICE. Awaiting APS  guardianship.  Essential hypertension: He refuses medication intermittently, continue current regimen of Coreg  Lasix  losartan  and Aldactone .  Hyperlipidemia: Continue Crestor .  Diabetes mellitus type 2: Last A1c of 5.4.  Polysubstance abuse: Noted UDS was positive for cocaine.  Deconditioning: He refuses therapy.  History of DVT: Continue Xarelto    DVT prophylaxis: Xarelto  Family Communication:none Status is: Inpatient Remains inpatient appropriate because: Awaiting APS guardianship    Code Status:     Code Status Orders  (From admission, onward)           Start     Ordered   03/04/24 1733  Full code  Continuous       Question:  By:  Answer:  Consent: discussion documented in EHR   03/04/24 1733           Code Status History     Date Active Date Inactive Code Status Order ID Comments User Context   01/14/2024 0224 01/18/2024 2101 Full Code [id number]  Debby Camila LABOR, MD ED   01/02/2024 1725 01/06/2024 2117 Full Code [id number]  Claudene Maximino LABOR, MD ED   12/20/2023 0601 12/22/2023 1832 Full Code [id number]  Charlton Evalene RAMAN, MD ED   10/22/2023 1835 10/24/2023 1159 Full Code [id number]  Franklyn Sid SAILOR, MD ED   12/07/2022 1712 06/16/2023 1820 Full Code [id number]  Rockey Denece LABOR, DO ED   11/24/2022 0745 12/03/2022 1902 Full Code [id number]  Celinda Alm Lot, MD ED   11/03/2022 0253 11/03/2022 1937 Full Code [id number]  Jarold Olam HERO, PA-C ED   08/17/2022 0142 10/09/2022 2035 Full Code [id number]  Debby Camila LABOR, MD ED   07/12/2022 1026 07/25/2022 2113 Full Code [id number]  Celinda Alm Lot, MD ED   04/04/2022 1240 04/11/2022 1710 Full Code [id number]  Celinda Alm Lot, MD ED   04/03/2022 1411 04/04/2022 1240 Full Code  [id number]  Logan Ubaldo KATHEE DEVONNA ED   03/23/2022 2112 03/31/2022 1517 Full Code [id number]  Perri DELENA Meliton Mickey., MD Inpatient   02/26/2022 1812 03/07/2022 1908 Full Code [id number]  Rockey Denece DELENA, DO Inpatient   01/29/2022 1515 02/05/2022 1533 Full Code [id number]   Waddell Rake, MD ED   12/03/2021 0841 12/09/2021 1457 Full Code [id number]  Kenard Zachary PARAS, MD ED   11/24/2021 2315 11/25/2021 2004 Full Code [id number]  Muthersbaugh, Chiquita DEVONNA ED   11/13/2021 1653 11/21/2021 1748 Full Code [id number]  Celinda Alm Lot, MD Inpatient   11/02/2021 2000 11/04/2021 2331 Full Code [id number]  Charlyn Sora, MD ED   07/03/2021 1143 07/05/2021 0057 Full Code [id number]  Jerrol Agent, MD ED   03/02/2021 1936 03/12/2021 1811 Full Code [id number]  Tobie Jorie SAUNDERS, MD ED   02/10/2021 2345 02/13/2021 1958 Full Code [id number]  Jarold Olam HERO, PA-C ED   02/09/2021 1953 02/10/2021 1536 Full Code [id number]  Brien Therisa MATSU, MD ED   12/17/2020 1516 02/03/2021 1650 Full Code [id number]  Barbar Darice PARAS, NP ED   12/08/2020 1336 12/10/2020 1931 Full Code [id number]  Malvina Ellen, MD ED   11/13/2020 2028 11/16/2020 1520 Full Code [id number]  Rockey Denece DELENA, DO ED   10/26/2020 1039 10/31/2020 2157 Full Code [id number]  Kriste Emeline BRAVO, MD ED   10/15/2020 0611 10/18/2020 1810 Full Code [id number]  Chotiner, Adine RAMAN, MD Inpatient   10/02/2020 2247 10/07/2020 1809 Full Code [id number]  Charlton Evalene RAMAN, MD ED   09/25/2020 1559 09/26/2020 2002 Full Code [id number]  Tobie Mario GAILS, MD ED   09/09/2020 1251 09/11/2020 1546 Full Code [id number]  Rojelio Nest, DO Inpatient   09/07/2020 1753 09/09/2020 1251 DNR [id number]  Rockey Denece DELENA, DO Inpatient   07/21/2020 1700 07/23/2020 2001 Full Code [id number]  [name] Fonda POUR, MD ED   07/07/2020 1529 07/07/2020 2109 Full Code [id number]  Cottie Donnice PARAS, MD ED   06/24/2019 0131 06/24/2019 1630 Full Code [id number]  Court Selinda DELENA, NP Inpatient   06/24/2019 0130 06/24/2019 0131 Full Code [id number]  Debby Fortes, NP Inpatient   06/21/2019 0941 06/22/2019 2007 Full Code [id number]  Madelon Pizza, DO Inpatient   05/24/2019 1117 05/26/2019 1836 Full Code [id number]  Lanny Maxwell, MD ED   04/18/2019 0149 04/19/2019 1628 DNR [id number]  Kerman Soulier, MD ED   04/04/2019 0555  04/06/2019 1413 Full Code [id number]  Kelby Schatz, MD ED   03/27/2019 2130 03/29/2019 1900 Full Code [id number]  [name] Fonda POUR, MD ED   03/04/2019 1600 03/09/2019 1222 Full Code [id number]  Feliberto Cambric, MD ED   02/13/2018 0439 02/14/2018 1333 Full Code [id number]  Carita Senior, MD ED   12/26/2016 0547 12/29/2016 2021 Full Code [id number]  Kandyce Norleen DASEN, MD Inpatient   11/27/2016 1541 11/28/2016 1319 Full Code [id number]  Tracey Bruckner, PA-C ED   10/29/2016 1415 10/31/2016 1150 Full Code [id number]  Patsey Lot, MD ED   09/06/2016 2230 09/07/2016 1350 Full Code [id number]  Josem Bring, MD ED   09/06/2016 0934 09/06/2016 1529 Full Code [id number]  Nelta Jackye Saha, MD ED   09/02/2016 0045 09/04/2016 2127 Full Code [id number]  Terryl Kubas, NP ED   07/30/2016 1357 08/06/2016 2031 Full Code [id number]  Reva Majel RAMAN, FNP Inpatient   07/30/2016 0143 07/30/2016 1153 Full Code [id number]  Haze Bruckner PARAS, MD ED   05/30/2016 1622 06/05/2016 1454 Full Code [id number]  Nicholaus Leita DELENA, NP Inpatient   05/29/2016 1635 05/30/2016 1622 Full Code [id number]  Emil Share, DO ED   12/02/2011 0245 12/12/2011 0720 Full Code [id number]  Desiderio Chew, PA ED         IV Access:   Peripheral IV   Procedures and diagnostic studies:   No results found.   Medical Consultants:   None.   Subjective:    Elizer Bostic no new complaints this morning  Objective:    Vitals:   06/02/24 1630 06/02/24 2010 06/03/24 0027 06/03/24 0757  BP: (!) 150/88 (!) 175/117 (!) 144/93 (!) 160/98  Pulse:  72 63 71  Resp:  18 18 16   Temp:   97.8 F (36.6 C)   TempSrc:      SpO2:  94% 100% 98%  Weight:      Height:       SpO2: 98 % O2 Flow Rate (L/min): 2 L/min FiO2 (%): 28 %  No intake or output data in the 24 hours ending 06/03/24 0758 Filed Weights   03/26/24 0431 03/28/24 0456 04/04/24 0500  Weight: 42.7 kg 47.5 kg 61.7 kg    Exam: General exam: In no acute distress. Respiratory system: Good air movement  and clear to auscultation. Cardiovascular system: S1 & S2 heard, RRR. No JVD. Gastrointestinal system: Abdomen is nondistended, soft and nontender.  Extremities: No pedal edema. Skin: No rashes, lesions or ulcers Psychiatry: Poor judgment and insight of medical condition.   Data Reviewed:    Labs: Basic Metabolic Panel: No results for input(s): NA, K, CL, CO2, GLUCOSE, BUN, CREATININE, CALCIUM , MG, PHOS in the last 168 hours. GFR CrCl cannot be calculated (Patient's most recent lab result is older than the maximum 21 days allowed.). Liver Function Tests: No results for input(s): AST, ALT, ALKPHOS, BILITOT, PROT, ALBUMIN in the last 168 hours. No results for input(s): LIPASE, AMYLASE in the last 168 hours. No results for input(s): AMMONIA in the last 168 hours. Coagulation profile No results for input(s): INR, PROTIME in the last 168 hours. COVID-19 Labs  No results for input(s): DDIMER, FERRITIN, LDH, CRP in the last 72 hours.  Lab Results  Component Value Date   SARSCOV2NAA NEGATIVE 01/13/2024   SARSCOV2NAA NEGATIVE 01/02/2024   SARSCOV2NAA NEGATIVE 12/20/2023   SARSCOV2NAA NEGATIVE 11/23/2022    CBC: No results for input(s): WBC, NEUTROABS, HGB, HCT, MCV, PLT in the last 168 hours. Cardiac Enzymes: No results for input(s): CKTOTAL, CKMB, CKMBINDEX, TROPONINI in the last 168 hours. BNP (last 3 results) No results for input(s): PROBNP in the last 8760 hours. CBG: No results for input(s): GLUCAP in the last 168 hours. D-Dimer: No results for input(s): DDIMER in the last 72 hours. Hgb A1c: No results for input(s): HGBA1C in the last 72 hours. Lipid Profile: No results for input(s): CHOL, HDL, LDLCALC, TRIG, CHOLHDL, LDLDIRECT in the last 72 hours. Thyroid  function studies: No results for input(s): TSH, T4TOTAL, T3FREE, THYROIDAB in the last 72 hours.  Invalid input(s):  FREET3 Anemia work up: No results for input(s): VITAMINB12, FOLATE, FERRITIN, TIBC, IRON, RETICCTPCT in the last 72 hours. Sepsis Labs: No results for input(s): PROCALCITON, WBC, LATICACIDVEN in the last 168 hours. Microbiology No results found for this or any previous visit (from the past 240 hours).   Medications:    carvedilol   10 mg Oral Daily   furosemide   40 mg Oral Daily   gabapentin   200 mg Oral BID   losartan   100 mg Oral Daily   revefenacin   175 mcg Nebulization Daily   rivaroxaban   20 mg Oral Q supper  rosuvastatin   5 mg Oral QHS   spironolactone   25 mg Oral Daily   Continuous Infusions:    LOS: 91 days   Erle Odell Castor  [facility]  06/03/2024, 7:58 AM

## 2024-06-03 NOTE — Progress Notes (Addendum)
 Elevated BP through out the day; patient active, out of bed in wheelchair; went off unit with supervision. Reports some mild dizziness, denies headache.  Reports emptying his bladder without difficulty. Reviewed importance of taking scheduled medications; patient selectively decides which medications he will not take. MD aware. Patient educated on medication schedule; continue to monitor compliance.

## 2024-06-04 MED ORDER — LISINOPRIL 10 MG PO TABS
10.0000 mg | ORAL_TABLET | Freq: Every day | ORAL | Status: DC
  Administered 2024-06-04: 10 mg via ORAL
  Filled 2024-06-04: qty 1

## 2024-06-04 NOTE — Progress Notes (Signed)
 [facility] PROGRESS NOTE    Progress Note  Dajion Bickford  FMW:[id number] DOB: [birth year] DOA: 03/04/2024 PCP: Tanda Bleacher, MD     Brief Narrative:   Jude Naclerio is an 58 y.o. male with history significant for COPD, cocaine abuse, presented to the ED 03/04/24 for slower speech, confusion and right-sided weakness, CT head showed left MCA infarct, CTA head and neck revealed an M2 occlusion on the left,   Echocardiogram revealed large mobile left ventricular thrombus on the left and right ventricular apical thrombus in the right, ejection fraction less than 20%.  Cardiology was consulted recommended DOAC.  PT evaluated patient and recommended skilled nursing facility placement.  Patient was found to lack medical decision capacity/status and not amenable to determining his discharge disposition or leave AGAINST MEDICAL ADVICE.   He intermittently declines physical exams and medications. TOC is pursuing guardianship with APS as he is lacking medical decision capacity. Psychiatry presently recommending to await APS decision for guardianship before discharging home.  Patient does not qualify for forced medication administration   Assessment/Plan:   Acute CVA (cerebrovascular accident) (HCC) Underwent extensive workup and imaging.  TEE was noted for large mobile mass EF 20%.  Reduced right ventricular function. Cardiology was consulted recommended Xarelto , he has remained noncompliance with this medication as an outpatient. Psychiatry deemed the patient to lack capacity. He continues to refuse his antihypertensive medication intermittently. Have explained to him the risk and benefits.  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 Last 2D echo showed EF 20%. Continue Xarelto .  Schizoaffective disorder/bipolar disorder/lacks medical capacity: Psychiatry recommended APS guardianship as the  patient lacks capacity to make medical decisions. Patient should not leave AGAINST MEDICAL ADVICE. Awaiting APS guardianship.  Essential hypertension: He refuses medication intermittently, continue current regimen of Coreg  Lasix  losartan  and Aldactone .  Hyperlipidemia: Continue Crestor .  Diabetes mellitus type 2: Last A1c of 5.4.  Polysubstance abuse: Noted UDS was positive for cocaine.  Deconditioning: He refuses therapy.  History of DVT: Continue Xarelto    DVT prophylaxis: Xarelto  Family Communication:none Status is: Inpatient Remains inpatient appropriate because: Awaiting APS guardianship    Code Status:     Code Status Orders  (From admission, onward)           Start     Ordered   03/04/24 1733  Full code  Continuous       Question:  By:  Answer:  Consent: discussion documented in EHR   03/04/24 1733           Code Status History     Date Active Date Inactive Code Status Order ID Comments User Context   01/14/2024 0224 01/18/2024 2101 Full Code [id number]  Debby Camila LABOR, MD ED   01/02/2024 1725 01/06/2024 2117 Full Code [id number]  Claudene Maximino LABOR, MD ED   12/20/2023 0601 12/22/2023 1832 Full Code [id number]  Charlton Evalene RAMAN, MD ED   10/22/2023 1835 10/24/2023 1159 Full Code [id number]  Franklyn Sid SAILOR, MD ED   12/07/2022 1712 06/16/2023 1820 Full Code [id number]  Rockey Denece LABOR, DO ED   11/24/2022 0745 12/03/2022 1902 Full Code [id number]  Celinda Alm Lot, MD ED   11/03/2022 0253 11/03/2022 1937 Full Code [id number]  Jarold Olam HERO, PA-C ED   08/17/2022 0142 10/09/2022 2035 Full Code [id number]  Debby Camila LABOR, MD ED   07/12/2022 1026 07/25/2022 2113 Full Code [id number]  Celinda Alm Lot, MD ED   04/04/2022 1240 04/11/2022 1710 Full  Code [id number]  Celinda Alm Lot, MD ED   04/03/2022 1411 04/04/2022 1240 Full Code [id number]  Logan Ubaldo KATHEE DEVONNA ED   03/23/2022 2112 03/31/2022 1517 Full Code [id number]  Perri DELENA Meliton Mickey., MD Inpatient   02/26/2022 1812  03/07/2022 1908 Full Code [id number]  Rockey Denece DELENA, DO Inpatient   01/29/2022 1515 02/05/2022 1533 Full Code [id number]  Waddell Rake, MD ED   12/03/2021 0841 12/09/2021 1457 Full Code [id number]  Kenard Zachary PARAS, MD ED   11/24/2021 2315 11/25/2021 2004 Full Code [id number]  Muthersbaugh, Chiquita DEVONNA ED   11/13/2021 1653 11/21/2021 1748 Full Code [id number]  Celinda Alm Lot, MD Inpatient   11/02/2021 2000 11/04/2021 2331 Full Code [id number]  Charlyn Sora, MD ED   07/03/2021 1143 07/05/2021 0057 Full Code [id number]  Jerrol Agent, MD ED   03/02/2021 1936 03/12/2021 1811 Full Code [id number]  Tobie Jorie SAUNDERS, MD ED   02/10/2021 2345 02/13/2021 1958 Full Code [id number]  Jarold Olam HERO, PA-C ED   02/09/2021 1953 02/10/2021 1536 Full Code [id number]  Brien Therisa MATSU, MD ED   12/17/2020 1516 02/03/2021 1650 Full Code [id number]  Barbar Darice PARAS, NP ED   12/08/2020 1336 12/10/2020 1931 Full Code [id number]  Malvina Ellen, MD ED   11/13/2020 2028 11/16/2020 1520 Full Code [id number]  Rockey Denece DELENA, DO ED   10/26/2020 1039 10/31/2020 2157 Full Code [id number]  Kriste Emeline BRAVO, MD ED   10/15/2020 0611 10/18/2020 1810 Full Code [id number]  Chotiner, Adine RAMAN, MD Inpatient   10/02/2020 2247 10/07/2020 1809 Full Code [id number]  Charlton Evalene RAMAN, MD ED   09/25/2020 1559 09/26/2020 2002 Full Code [id number]  Tobie Mario GAILS, MD ED   09/09/2020 1251 09/11/2020 1546 Full Code [id number]  Rojelio Nest, DO Inpatient   09/07/2020 1753 09/09/2020 1251 DNR [id number]  Rockey Denece DELENA, DO Inpatient   07/21/2020 1700 07/23/2020 2001 Full Code [id number]  [name] Fonda POUR, MD ED   07/07/2020 1529 07/07/2020 2109 Full Code [id number]  Cottie Donnice PARAS, MD ED   06/24/2019 0131 06/24/2019 1630 Full Code [id number]  Court Selinda DELENA, NP Inpatient   06/24/2019 0130 06/24/2019 0131 Full Code [id number]  Debby Fortes, NP Inpatient   06/21/2019 0941 06/22/2019 2007 Full Code [id number]  Madelon Pizza, DO Inpatient   05/24/2019 1117 05/26/2019 1836 Full Code  [id number]  Lanny Maxwell, MD ED   04/18/2019 0149 04/19/2019 1628 DNR [id number]  Kerman Soulier, MD ED   04/04/2019 0555 04/06/2019 1413 Full Code [id number]  Kelby Schatz, MD ED   03/27/2019 2130 03/29/2019 1900 Full Code [id number]  [name] Fonda POUR, MD ED   03/04/2019 1600 03/09/2019 1222 Full Code [id number]  Feliberto Cambric, MD ED   02/13/2018 0439 02/14/2018 1333 Full Code [id number]  Carita Senior, MD ED   12/26/2016 0547 12/29/2016 2021 Full Code [id number]  Kandyce Norleen DASEN, MD Inpatient   11/27/2016 1541 11/28/2016 1319 Full Code [id number]  Tracey Bruckner, PA-C ED   10/29/2016 1415 10/31/2016 1150 Full Code [id number]  Patsey Lot, MD ED   09/06/2016 2230 09/07/2016 1350 Full Code [id number]  Josem Bring, MD ED   09/06/2016 0934 09/06/2016 1529 Full Code [id number]  Nelta Jackye Saha, MD ED   09/02/2016 0045 09/04/2016 2127 Full Code [id number]  Terryl Kubas, NP ED   07/30/2016 1357 08/06/2016 2031 Full Code [id number]  Reva Majel RAMAN, FNP Inpatient   07/30/2016 0143 07/30/2016 1153 Full Code [id number]  Haze Bruckner PARAS, MD ED   05/30/2016 1622 06/05/2016 1454 Full Code  [id number]  Nicholaus Leita LABOR, NP Inpatient   05/29/2016 1635 05/30/2016 1622 Full Code [id number]  Emil Share, DO ED   12/02/2011 0245 12/12/2011 0720 Full Code [id number]  Desiderio Chew, PA ED         IV Access:   Peripheral IV   Procedures and diagnostic studies:   No results found.   Medical Consultants:   None.   Subjective:    Easten Maceachern no new complaints this morning  Objective:    Vitals:   06/03/24 1316 06/03/24 1624 06/03/24 2018 06/04/24 0450  BP: (!) 158/120 (!) 185/100 (!) 169/99 (!) 153/99  Pulse: 72 84 68 70  Resp: 18 16    Temp:      TempSrc:      SpO2: 100% 100% 96% 95%  Weight:      Height:       SpO2: 95 % O2 Flow Rate (L/min): 2 L/min FiO2 (%): 28 %   Intake/Output Summary (Last 24 hours) at 06/04/2024 0752 Last data filed at 06/03/2024 1614 Gross per 24 hour  Intake 720 ml  Output --   Net 720 ml   Filed Weights   03/26/24 0431 03/28/24 0456 04/04/24 0500  Weight: 42.7 kg 47.5 kg 61.7 kg    Exam: General exam: In no acute distress. Respiratory system: Good air movement and clear to auscultation. Cardiovascular system: S1 & S2 heard, RRR. No JVD. Gastrointestinal system: Abdomen is nondistended, soft and nontender.  Extremities: No pedal edema. Skin: No rashes, lesions or ulcers Psychiatry: Poor judgment and insight of medical condition.   Data Reviewed:    Labs: Basic Metabolic Panel: No results for input(s): NA, K, CL, CO2, GLUCOSE, BUN, CREATININE, CALCIUM , MG, PHOS in the last 168 hours. GFR CrCl cannot be calculated (Patient's most recent lab result is older than the maximum 21 days allowed.). Liver Function Tests: No results for input(s): AST, ALT, ALKPHOS, BILITOT, PROT, ALBUMIN in the last 168 hours. No results for input(s): LIPASE, AMYLASE in the last 168 hours. No results for input(s): AMMONIA in the last 168 hours. Coagulation profile No results for input(s): INR, PROTIME in the last 168 hours. COVID-19 Labs  No results for input(s): DDIMER, FERRITIN, LDH, CRP in the last 72 hours.  Lab Results  Component Value Date   SARSCOV2NAA NEGATIVE 01/13/2024   SARSCOV2NAA NEGATIVE 01/02/2024   SARSCOV2NAA NEGATIVE 12/20/2023   SARSCOV2NAA NEGATIVE 11/23/2022    CBC: No results for input(s): WBC, NEUTROABS, HGB, HCT, MCV, PLT in the last 168 hours. Cardiac Enzymes: No results for input(s): CKTOTAL, CKMB, CKMBINDEX, TROPONINI in the last 168 hours. BNP (last 3 results) No results for input(s): PROBNP in the last 8760 hours. CBG: No results for input(s): GLUCAP in the last 168 hours. D-Dimer: No results for input(s): DDIMER in the last 72 hours. Hgb A1c: No results for input(s): HGBA1C in the last 72 hours. Lipid Profile: No results for input(s): CHOL, HDL,  LDLCALC, TRIG, CHOLHDL, LDLDIRECT in the last 72 hours. Thyroid  function studies: No results for input(s): TSH, T4TOTAL, T3FREE, THYROIDAB in the last 72 hours.  Invalid input(s): FREET3 Anemia work up: No results for input(s): VITAMINB12, FOLATE, FERRITIN, TIBC, IRON, RETICCTPCT in the last 72 hours. Sepsis Labs: No results for input(s): PROCALCITON, WBC, LATICACIDVEN in the last 168 hours. Microbiology No results found for this or any previous visit (from the past 240 hours).   Medications:    carvedilol   10 mg Oral Daily   furosemide   40 mg Oral Daily  gabapentin   200 mg Oral BID   losartan   100 mg Oral Daily   revefenacin   175 mcg Nebulization Daily   rivaroxaban   20 mg Oral Q supper   rosuvastatin   5 mg Oral QHS   spironolactone   25 mg Oral Daily   Continuous Infusions:    LOS: 92 days   Erle Odell Castor  [facility]  06/04/2024, 7:52 AM

## 2024-06-04 NOTE — Progress Notes (Signed)
 Patient BP 190/110 manually, asymptomatic with no HA, blurry vision or palpitations.  Dr Elaina made aware, advised to give patient dose of Lasix  that her refused at 10 am today.  Patient compliant and took Lasix  40 mg PO.  Will reassess BP in 1-2 hours for effectiveness.

## 2024-06-04 NOTE — Plan of Care (Signed)
  Problem: Nutrition: Goal: Adequate nutrition will be maintained Outcome: Progressing   Problem: Coping: Goal: Level of anxiety will decrease Outcome: Progressing   Problem: Elimination: Goal: Will not experience complications related to bowel motility Outcome: Progressing Goal: Will not experience complications related to urinary retention Outcome: Progressing   Problem: Pain Managment: Goal: General experience of comfort will improve and/or be controlled Outcome: Progressing   Problem: Safety: Goal: Ability to remain free from injury will improve Outcome: Progressing   Problem: Skin Integrity: Goal: Risk for impaired skin integrity will decrease Outcome: Progressing

## 2024-06-05 DIAGNOSIS — I639 Cerebral infarction, unspecified: Secondary | ICD-10-CM | POA: Diagnosis not present

## 2024-06-05 MED ORDER — LISINOPRIL 20 MG PO TABS
20.0000 mg | ORAL_TABLET | Freq: Every day | ORAL | Status: DC
  Administered 2024-06-05: 20 mg via ORAL
  Filled 2024-06-05 (×2): qty 1

## 2024-06-05 NOTE — Plan of Care (Signed)
  Problem: Nutrition: Goal: Adequate nutrition will be maintained Outcome: Progressing   Problem: Coping: Goal: Level of anxiety will decrease Outcome: Progressing   Problem: Elimination: Goal: Will not experience complications related to bowel motility Outcome: Progressing Goal: Will not experience complications related to urinary retention Outcome: Progressing   Problem: Pain Managment: Goal: General experience of comfort will improve and/or be controlled Outcome: Progressing   Problem: Safety: Goal: Ability to remain free from injury will improve Outcome: Progressing   Problem: Skin Integrity: Goal: Risk for impaired skin integrity will decrease Outcome: Progressing

## 2024-06-05 NOTE — Progress Notes (Signed)
 [facility] PROGRESS NOTE    Progress Note  [name]  FMW:[id number] DOB: [birth year] DOA: 03/04/2024 PCP: Tanda Bleacher, MD     Brief Narrative:   [name] is an 58 y.o. male with history significant for COPD, cocaine abuse, presented to the ED 03/04/24 for slower speech, confusion and right-sided weakness, CT head showed left MCA infarct, CTA head and neck revealed an M2 occlusion on the left,   Echocardiogram revealed large mobile left ventricular thrombus on the left and right ventricular apical thrombus in the right, ejection fraction less than 20%.  Cardiology was consulted recommended DOAC.  PT evaluated patient and recommended skilled nursing facility placement.  Patient was found to lack medical decision capacity/status and not amenable to determining his discharge disposition or leave AGAINST MEDICAL ADVICE.   He intermittently declines physical exams and medications. TOC is pursuing guardianship with [facility]PS as he is lacking medical decision capacity. Psychiatry presently recommending to await APS decision for guardianship before discharging home.  Patient does not qualify for forced medication administration   Assessment/Plan:   Acute CVA (cerebrovascular accident) (HCC) Underwent extensive workup and imaging.  TEE was noted for large mobile mass EF 20%.  Reduced right ventricular function. Cardiology was consulted recommended Xarelto , he has remained noncompliance with this medication as an outpatient. Psychiatry deemed the patient to lack capacity. He continues to refuse his antihypertensive medication intermittently. Have explained to him the risk and benefits.  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 Last 2D echo showed EF 20%. Continue Xarelto .  Schizoaffective disorder/bipolar disorder/lacks medical capacity: Psychiatry recommended APS guardianship as the  patient lacks capacity to make medical decisions. Patient should not leave AGAINST MEDICAL ADVICE. Awaiting APS guardianship.  Essential hypertension: He refuses medication intermittently, continue current regimen of Coreg  Lasix  losartan  and Aldactone .  Hyperlipidemia: Continue Crestor .  Diabetes mellitus type 2: Last A1c of 5.4.  Polysubstance abuse: Noted UDS was positive for cocaine.  Deconditioning: He refuses therapy.  History of DVT: Continue Xarelto    DVT prophylaxis: Xarelto  Family Communication:none Status is: Inpatient Remains inpatient appropriate because: Awaiting APS guardianship    Code Status:     Code Status Orders  (From admission, onward)           Start     Ordered   03/04/24 1733  Full code  Continuous       Question:  By:  Answer:  Consent: discussion documented in EHR   03/04/24 1733           Code Status History     Date Active Date Inactive Code Status Order ID Comments User Context   01/14/2024 0224 01/18/2024 2101 Full Code [id number]  Debby Camila LABOR, MD ED   01/02/2024 1725 01/06/2024 2117 Full Code [id number]  Claudene Maximino LABOR, MD ED   12/20/2023 0601 12/22/2023 1832 Full Code [id number]  Charlton Evalene RAMAN, MD ED   10/22/2023 1835 10/24/2023 1159 Full Code [id number]  Franklyn Sid SAILOR, MD ED   12/07/2022 1712 06/16/2023 1820 Full Code [id number]  Rockey Denece LABOR, DO ED   11/24/2022 0745 12/03/2022 1902 Full Code [id number]  Celinda Alm Lot, MD ED   11/03/2022 0253 11/03/2022 1937 Full Code [id number]  Jarold Olam HERO, PA-C ED   08/17/2022 0142 10/09/2022 2035 Full Code [id number]  Debby Camila LABOR, MD ED   07/12/2022 1026 07/25/2022 2113 Full Code [id number]  Celinda Alm Lot, MD ED   04/04/2022 1240 04/11/2022 1710 Full  Code [id number]  Celinda Alm Lot, MD ED   04/03/2022 1411 04/04/2022 1240 Full Code [id number]  Logan Ubaldo KATHEE DEVONNA ED   03/23/2022 2112 03/31/2022 1517 Full Code [id number]  Perri DELENA Meliton Mickey., MD Inpatient   02/26/2022 1812  03/07/2022 1908 Full Code [id number]  Rockey Denece DELENA, DO Inpatient   01/29/2022 1515 02/05/2022 1533 Full Code [id number]  Waddell Rake, MD ED   12/03/2021 0841 12/09/2021 1457 Full Code [id number]  Kenard Zachary PARAS, MD ED   11/24/2021 2315 11/25/2021 2004 Full Code [id number]  Muthersbaugh, Chiquita DEVONNA ED   11/13/2021 1653 11/21/2021 1748 Full Code [id number]  Celinda Alm Lot, MD Inpatient   11/02/2021 2000 11/04/2021 2331 Full Code [id number]  Charlyn Sora, MD ED   07/03/2021 1143 07/05/2021 0057 Full Code [id number]  Jerrol Agent, MD ED   03/02/2021 1936 03/12/2021 1811 Full Code [id number]  Tobie Jorie SAUNDERS, MD ED   02/10/2021 2345 02/13/2021 1958 Full Code [id number]  Jarold Olam HERO, PA-C ED   02/09/2021 1953 02/10/2021 1536 Full Code [id number]  Brien Therisa MATSU, MD ED   12/17/2020 1516 02/03/2021 1650 Full Code [id number]  Barbar Darice PARAS, NP ED   12/08/2020 1336 12/10/2020 1931 Full Code [id number]  Malvina Ellen, MD ED   11/13/2020 2028 11/16/2020 1520 Full Code [id number]  Rockey Denece DELENA, DO ED   10/26/2020 1039 10/31/2020 2157 Full Code [id number]  Kriste Emeline BRAVO, MD ED   10/15/2020 0611 10/18/2020 1810 Full Code [id number]  Chotiner, Adine RAMAN, MD Inpatient   10/02/2020 2247 10/07/2020 1809 Full Code [id number]  Charlton Evalene RAMAN, MD ED   09/25/2020 1559 09/26/2020 2002 Full Code [id number]  Tobie Mario GAILS, MD ED   09/09/2020 1251 09/11/2020 1546 Full Code [id number]  Rojelio Nest, DO Inpatient   09/07/2020 1753 09/09/2020 1251 DNR [id number]  Rockey Denece DELENA, DO Inpatient   07/21/2020 1700 07/23/2020 2001 Full Code [id number]  [name] Fonda POUR, MD ED   07/07/2020 1529 07/07/2020 2109 Full Code [id number]  Cottie Donnice PARAS, MD ED   06/24/2019 0131 06/24/2019 1630 Full Code [id number]  Court Selinda DELENA, NP Inpatient   06/24/2019 0130 06/24/2019 0131 Full Code [id number]  Debby Fortes, NP Inpatient   06/21/2019 0941 06/22/2019 2007 Full Code [id number]  Madelon Pizza, DO Inpatient   05/24/2019 1117 05/26/2019 1836 Full Code  [id number]  Lanny Maxwell, MD ED   04/18/2019 0149 04/19/2019 1628 DNR [id number]  Kerman Soulier, MD ED   04/04/2019 0555 04/06/2019 1413 Full Code [id number]  Kelby Schatz, MD ED   03/27/2019 2130 03/29/2019 1900 Full Code [id number]  [name] Fonda POUR, MD ED   03/04/2019 1600 03/09/2019 1222 Full Code [id number]  Feliberto Cambric, MD ED   02/13/2018 0439 02/14/2018 1333 Full Code [id number]  Carita Senior, MD ED   12/26/2016 0547 12/29/2016 2021 Full Code [id number]  Kandyce Norleen DASEN, MD Inpatient   11/27/2016 1541 11/28/2016 1319 Full Code [id number]  Tracey Bruckner, PA-C ED   10/29/2016 1415 10/31/2016 1150 Full Code [id number]  Patsey Lot, MD ED   09/06/2016 2230 09/07/2016 1350 Full Code [id number]  Josem Bring, MD ED   09/06/2016 0934 09/06/2016 1529 Full Code [id number]  Nelta Jackye Saha, MD ED   09/02/2016 0045 09/04/2016 2127 Full Code [id number]  Terryl Kubas, NP ED   07/30/2016 1357 08/06/2016 2031 Full Code [id number]  Reva Majel RAMAN, FNP Inpatient   07/30/2016 0143 07/30/2016 1153 Full Code [id number]  Haze Bruckner PARAS, MD ED   05/30/2016 1622 06/05/2016 1454 Full Code  [id number]  Nicholaus Leita LABOR, NP Inpatient   05/29/2016 1635 05/30/2016 1622 Full Code [id number]  Emil Share, DO ED   12/02/2011 0245 12/12/2011 0720 Full Code [id number]  Desiderio Chew, PA ED         IV Access:   Peripheral IV   Procedures and diagnostic studies:   No results found.   Medical Consultants:   None.   Subjective:    Livan Hires no new complaints this morning  Objective:    Vitals:   06/04/24 2127 06/04/24 2129 06/04/24 2138 06/05/24 0402  BP: (!) 206/185 (!) 192/119 (!) 190/110 (!) 149/100  Pulse: (!) 39 73  (!) 59  Resp:    18  TempSrc:      SpO2: 94%   100%  Weight:      Height:       SpO2: 100 % O2 Flow Rate (L/min): 2 L/min FiO2 (%): 28 %   Intake/Output Summary (Last 24 hours) at 06/05/2024 0705 Last data filed at 06/05/2024 0500 Gross per 24 hour  Intake 600 ml  Output 1400 ml  Net  -800 ml   Filed Weights   03/26/24 0431 03/28/24 0456 04/04/24 0500  Weight: 42.7 kg 47.5 kg 61.7 kg    Exam: General exam: In no acute distress. Respiratory system: Good air movement and clear to auscultation. Cardiovascular system: S1 & S2 heard, RRR. No JVD. Gastrointestinal system: Abdomen is nondistended, soft and nontender.  Extremities: No pedal edema. Skin: No rashes, lesions or ulcers Psychiatry: Poor judgment and insight of medical condition.   Data Reviewed:    Labs: Basic Metabolic Panel: No results for input(s): NA, K, CL, CO2, GLUCOSE, BUN, CREATININE, CALCIUM , MG, PHOS in the last 168 hours. GFR CrCl cannot be calculated (Patient's most recent lab result is older than the maximum 21 days allowed.). Liver Function Tests: No results for input(s): AST, ALT, ALKPHOS, BILITOT, PROT, ALBUMIN in the last 168 hours. No results for input(s): LIPASE, AMYLASE in the last 168 hours. No results for input(s): AMMONIA in the last 168 hours. Coagulation profile No results for input(s): INR, PROTIME in the last 168 hours. COVID-19 Labs  No results for input(s): DDIMER, FERRITIN, LDH, CRP in the last 72 hours.  Lab Results  Component Value Date   SARSCOV2NAA NEGATIVE 01/13/2024   SARSCOV2NAA NEGATIVE 01/02/2024   SARSCOV2NAA NEGATIVE 12/20/2023   SARSCOV2NAA NEGATIVE 11/23/2022    CBC: No results for input(s): WBC, NEUTROABS, HGB, HCT, MCV, PLT in the last 168 hours. Cardiac Enzymes: No results for input(s): CKTOTAL, CKMB, CKMBINDEX, TROPONINI in the last 168 hours. BNP (last 3 results) No results for input(s): PROBNP in the last 8760 hours. CBG: No results for input(s): GLUCAP in the last 168 hours. D-Dimer: No results for input(s): DDIMER in the last 72 hours. Hgb A1c: No results for input(s): HGBA1C in the last 72 hours. Lipid Profile: No results for input(s): CHOL, HDL,  LDLCALC, TRIG, CHOLHDL, LDLDIRECT in the last 72 hours. Thyroid  function studies: No results for input(s): TSH, T4TOTAL, T3FREE, THYROIDAB in the last 72 hours.  Invalid input(s): FREET3 Anemia work up: No results for input(s): VITAMINB12, FOLATE, FERRITIN, TIBC, IRON, RETICCTPCT in the last 72 hours. Sepsis Labs: No results for input(s): PROCALCITON, WBC, LATICACIDVEN in the last 168 hours. Microbiology No results found for this or any previous visit (from the past 240 hours).   Medications:    carvedilol   10 mg Oral Daily   furosemide   40 mg Oral Daily   gabapentin   200 mg Oral BID   lisinopril   10 mg Oral Daily   revefenacin   175 mcg Nebulization Daily   rivaroxaban   20 mg Oral Q supper   rosuvastatin   5 mg Oral QHS   spironolactone   25 mg Oral Daily   Continuous Infusions:    LOS: 93 days   Erle Odell Castor  [facility]  06/05/2024, 7:05 AM

## 2024-06-06 DIAGNOSIS — I639 Cerebral infarction, unspecified: Secondary | ICD-10-CM | POA: Diagnosis not present

## 2024-06-06 MED ORDER — LISINOPRIL 20 MG PO TABS
40.0000 mg | ORAL_TABLET | Freq: Every day | ORAL | Status: DC
  Administered 2024-06-06 – 2024-08-17 (×69): 40 mg via ORAL
  Filled 2024-06-06 (×70): qty 2

## 2024-06-06 NOTE — Plan of Care (Signed)
  Problem: Nutrition: Goal: Adequate nutrition will be maintained Outcome: Progressing   Problem: Coping: Goal: Level of anxiety will decrease Outcome: Progressing   Problem: Elimination: Goal: Will not experience complications related to bowel motility Outcome: Progressing Goal: Will not experience complications related to urinary retention Outcome: Progressing   Problem: Pain Managment: Goal: General experience of comfort will improve and/or be controlled Outcome: Progressing   Problem: Safety: Goal: Ability to remain free from injury will improve Outcome: Progressing   Problem: Skin Integrity: Goal: Risk for impaired skin integrity will decrease Outcome: Progressing

## 2024-06-06 NOTE — Progress Notes (Signed)
 [facility] PROGRESS NOTE    Progress Note  [name]  FMW:[id number] DOB: 1966-01-02 DOA: 03/04/2024 PCP: Tanda Bleacher, MD     Brief Narrative:   [name] is an 58 y.o. male with history significant for COPD, cocaine abuse, presented to the ED 03/04/24 for slower speech, confusion and right-sided weakness, CT head showed left MCA infarct, CTA head and neck revealed an M2 occlusion on the left,   Echocardiogram revealed large mobile left ventricular thrombus on the left and right ventricular apical thrombus in the right, ejection fraction less than 20%.  Cardiology was consulted recommended DOAC.  PT evaluated patient and recommended skilled nursing facility placement.  Patient was found to lack medical decision capacity/status and not amenable to determining his discharge disposition or leave AGAINST MEDICAL ADVICE.   He intermittently declines physical exams and medications. TOC is pursuing guardianship with APS as he is lacking medical decision capacity. Psychiatry presently recommending to await APS decision for guardianship before discharging home.  Patient does not qualify for forced medication administration   Assessment/Plan:   Acute CVA (cerebrovascular accident) (HCC) Underwent extensive workup and imaging.  TEE was noted for large mobile mass EF 20%.  Reduced right ventricular function. Cardiology was consulted recommended Xarelto , he has remained noncompliance with this medication as an outpatient. Psychiatry deemed the patient to lack capacity. He continues to refuse his antihypertensive medication intermittently. Have explained to him the risk and benefits.  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 Last 2D echo showed EF 20%. Continue Xarelto .  Schizoaffective disorder/bipolar disorder/lacks medical capacity: Psychiatry recommended APS guardianship as the  patient lacks capacity to make medical decisions. Patient should not leave AGAINST MEDICAL ADVICE. Awaiting APS guardianship.  Essential hypertension: He refuses medication intermittently, continue current regimen of Coreg  Lasix  losartan  and Aldactone .  Hyperlipidemia: Continue Crestor .  Diabetes mellitus type 2: Last A1c of 5.4.  Polysubstance abuse: Noted UDS was positive for cocaine.  Deconditioning: He refuses therapy.  History of DVT: Continue Xarelto    DVT prophylaxis: Xarelto  Family Communication:none Status is: Inpatient Remains inpatient appropriate because: Awaiting APS guardianship    Code Status:     Code Status Orders  (From admission, onward)           Start     Ordered   03/04/24 1733  Full code  Continuous       Question:  By:  Answer:  Consent: discussion documented in EHR   03/04/24 1733           Code Status History     Date Active Date Inactive Code Status Order ID Comments User Context   01/14/2024 0224 01/18/2024 2101 Full Code [id number]  Debby Camila LABOR, MD ED   01/02/2024 1725 01/06/2024 2117 Full Code [id number]  Claudene Maximino LABOR, MD ED   12/20/2023 0601 12/22/2023 1832 Full Code [id number]  Charlton Evalene RAMAN, MD ED   10/22/2023 1835 10/24/2023 1159 Full Code [id number]  Franklyn Sid SAILOR, MD ED   12/07/2022 1712 06/16/2023 1820 Full Code [id number]  Rockey Denece LABOR, DO ED   11/24/2022 0745 12/03/2022 1902 Full Code [id number]  Celinda Alm Lot, MD ED   11/03/2022 0253 11/03/2022 1937 Full Code [id number]  Jarold Olam HERO, PA-C ED   08/17/2022 0142 10/09/2022 2035 Full Code [id number]  Debby Camila LABOR, MD ED   07/12/2022 1026 07/25/2022 2113 Full Code [id number]  Celinda Alm Lot, MD ED   04/04/2022 1240 04/11/2022 1710 Full  Code [id number]  Celinda Alm Lot, MD ED   04/03/2022 1411 04/04/2022 1240 Full Code [id number]  Logan Ubaldo KATHEE DEVONNA ED   03/23/2022 2112 03/31/2022 1517 Full Code [id number]  Perri DELENA Meliton Mickey., MD Inpatient   02/26/2022 1812  03/07/2022 1908 Full Code [id number]  Rockey Denece DELENA, DO Inpatient   01/29/2022 1515 02/05/2022 1533 Full Code [id number]  Waddell Rake, MD ED   12/03/2021 0841 12/09/2021 1457 Full Code [id number]  Kenard Zachary PARAS, MD ED   11/24/2021 2315 11/25/2021 2004 Full Code [id number]  Muthersbaugh, Chiquita DEVONNA ED   11/13/2021 1653 11/21/2021 1748 Full Code [id number]  Celinda Alm Lot, MD Inpatient   11/02/2021 2000 11/04/2021 2331 Full Code [id number]  Charlyn Sora, MD ED   07/03/2021 1143 07/05/2021 0057 Full Code [id number]  Jerrol Agent, MD ED   03/02/2021 1936 03/12/2021 1811 Full Code [id number]  Tobie Jorie SAUNDERS, MD ED   02/10/2021 2345 02/13/2021 1958 Full Code [id number]  Jarold Olam HERO, PA-C ED   02/09/2021 1953 02/10/2021 1536 Full Code [id number]  Brien Therisa MATSU, MD ED   12/17/2020 1516 02/03/2021 1650 Full Code [id number]  Barbar Darice PARAS, NP ED   12/08/2020 1336 12/10/2020 1931 Full Code [id number]  Malvina Ellen, MD ED   11/13/2020 2028 11/16/2020 1520 Full Code [id number]  Rockey Denece DELENA, DO ED   10/26/2020 1039 10/31/2020 2157 Full Code [id number]  Kriste Emeline BRAVO, MD ED   10/15/2020 0611 10/18/2020 1810 Full Code [id number]  Chotiner, Adine RAMAN, MD Inpatient   10/02/2020 2247 10/07/2020 1809 Full Code [id number]  Charlton Evalene RAMAN, MD ED   09/25/2020 1559 09/26/2020 2002 Full Code [id number]  Tobie Mario GAILS, MD ED   09/09/2020 1251 09/11/2020 1546 Full Code [id number]  Rojelio Nest, DO Inpatient   09/07/2020 1753 09/09/2020 1251 DNR [id number]  Rockey Denece DELENA, DO Inpatient   07/21/2020 1700 07/23/2020 2001 Full Code [id number]  [name] Fonda POUR, MD ED   07/07/2020 1529 07/07/2020 2109 Full Code [id number]  Cottie Donnice PARAS, MD ED   06/24/2019 0131 06/24/2019 1630 Full Code [id number]  Court Selinda DELENA, NP Inpatient   06/24/2019 0130 06/24/2019 0131 Full Code [id number]  Debby Fortes, NP Inpatient   06/21/2019 0941 06/22/2019 2007 Full Code [id number]  Madelon Pizza, DO Inpatient   05/24/2019 1117 05/26/2019 1836 Full Code  [id number]  Lanny Maxwell, MD ED   04/18/2019 0149 04/19/2019 1628 DNR [id number]  Kerman Soulier, MD ED   04/04/2019 0555 04/06/2019 1413 Full Code [id number]  Kelby Schatz, MD ED   03/27/2019 2130 03/29/2019 1900 Full Code [id number]  [name] Fonda POUR, MD ED   03/04/2019 1600 03/09/2019 1222 Full Code [id number]  Feliberto Cambric, MD ED   02/13/2018 0439 02/14/2018 1333 Full Code [id number]  Carita Senior, MD ED   12/26/2016 0547 12/29/2016 2021 Full Code [id number]  Kandyce Norleen DASEN, MD Inpatient   11/27/2016 1541 11/28/2016 1319 Full Code [id number]  Tracey Bruckner, PA-C ED   10/29/2016 1415 10/31/2016 1150 Full Code [id number]  Patsey Lot, MD ED   09/06/2016 2230 09/07/2016 1350 Full Code [id number]  Josem Bring, MD ED   09/06/2016 0934 09/06/2016 1529 Full Code [id number]  Nelta Jackye Saha, MD ED   09/02/2016 0045 09/04/2016 2127 Full Code [id number]  Terryl Kubas, NP ED   07/30/2016 1357 08/06/2016 2031 Full Code [id number]  Reva Majel RAMAN, FNP Inpatient   07/30/2016 0143 07/30/2016 1153 Full Code [id number]  Haze Bruckner PARAS, MD ED   05/30/2016 1622 06/05/2016 1454 Full Code  [id number]  Nicholaus Leita LABOR, NP Inpatient   05/29/2016 1635 05/30/2016 1622 Full Code [id number]  Emil Share, DO ED   12/02/2011 0245 12/12/2011 0720 Full Code [id number]  Desiderio Chew, PA ED         IV Access:   Peripheral IV   Procedures and diagnostic studies:   No results found.   Medical Consultants:   None.   Subjective:    Kenyan Karnes no new complaints this morning  Objective:    Vitals:   06/05/24 0822 06/05/24 0834 06/05/24 2009 06/06/24 0001  BP: (!) 156/111  (!) 177/110 (!) 155/105  Pulse: 68  68 73  Resp: 18  18 18   Temp:   98.6 F (37 C) 98 F (36.7 C)  TempSrc:      SpO2: 100% 99% 100% 98%  Weight:      Height:       SpO2: 98 % O2 Flow Rate (L/min): 2 L/min FiO2 (%): 28 %   Intake/Output Summary (Last 24 hours) at 06/06/2024 0830 Last data filed at 06/05/2024 1000 Gross per 24 hour   Intake 240 ml  Output --  Net 240 ml   Filed Weights   03/26/24 0431 03/28/24 0456 04/04/24 0500  Weight: 42.7 kg 47.5 kg 61.7 kg    Exam: General exam: In no acute distress. Respiratory system: Good air movement and clear to auscultation. Cardiovascular system: S1 & S2 heard, RRR. No JVD. Gastrointestinal system: Abdomen is nondistended, soft and nontender.  Extremities: No pedal edema. Skin: No rashes, lesions or ulcers Psychiatry: Poor judgment and insight of medical condition.   Data Reviewed:    Labs: Basic Metabolic Panel: No results for input(s): NA, K, CL, CO2, GLUCOSE, BUN, CREATININE, CALCIUM , MG, PHOS in the last 168 hours. GFR CrCl cannot be calculated (Patient's most recent lab result is older than the maximum 21 days allowed.). Liver Function Tests: No results for input(s): AST, ALT, ALKPHOS, BILITOT, PROT, ALBUMIN in the last 168 hours. No results for input(s): LIPASE, AMYLASE in the last 168 hours. No results for input(s): AMMONIA in the last 168 hours. Coagulation profile No results for input(s): INR, PROTIME in the last 168 hours. COVID-19 Labs  No results for input(s): DDIMER, FERRITIN, LDH, CRP in the last 72 hours.  Lab Results  Component Value Date   SARSCOV2NAA NEGATIVE 01/13/2024   SARSCOV2NAA NEGATIVE 01/02/2024   SARSCOV2NAA NEGATIVE 12/20/2023   SARSCOV2NAA NEGATIVE 11/23/2022    CBC: No results for input(s): WBC, NEUTROABS, HGB, HCT, MCV, PLT in the last 168 hours. Cardiac Enzymes: No results for input(s): CKTOTAL, CKMB, CKMBINDEX, TROPONINI in the last 168 hours. BNP (last 3 results) No results for input(s): PROBNP in the last 8760 hours. CBG: No results for input(s): GLUCAP in the last 168 hours. D-Dimer: No results for input(s): DDIMER in the last 72 hours. Hgb A1c: No results for input(s): HGBA1C in the last 72 hours. Lipid Profile: No results for  input(s): CHOL, HDL, LDLCALC, TRIG, CHOLHDL, LDLDIRECT in the last 72 hours. Thyroid  function studies: No results for input(s): TSH, T4TOTAL, T3FREE, THYROIDAB in the last 72 hours.  Invalid input(s): FREET3 Anemia work up: No results for input(s): VITAMINB12, FOLATE, FERRITIN, TIBC, IRON, RETICCTPCT in the last 72 hours. Sepsis Labs: No results for input(s): PROCALCITON, WBC, LATICACIDVEN in the last 168 hours. Microbiology No results found for this or any previous visit (from the past 240 hours).   Medications:    carvedilol   10 mg Oral Daily   furosemide   40 mg Oral Daily   gabapentin   200 mg Oral BID   lisinopril   20 mg Oral Daily   revefenacin   175 mcg Nebulization Daily   rivaroxaban   20 mg Oral Q supper   rosuvastatin   5 mg Oral QHS   spironolactone   25 mg Oral Daily   Continuous Infusions:    LOS: 94 days   Erle Odell Castor  [facility]  06/06/2024, 8:30 AM

## 2024-06-07 DIAGNOSIS — I639 Cerebral infarction, unspecified: Secondary | ICD-10-CM | POA: Diagnosis not present

## 2024-06-07 NOTE — Progress Notes (Signed)
 PT Cancellation Note  Patient Details Name: [name] MRN: [id number] DOB: [birth year]   Cancelled Treatment:    Reason Eval/Treat Not Completed: Other (comment) Pt speaking with CSW Vertell in hallway; pt states, I am good for today, after asking if he wants to participate with PT this afternoon.  [name], PT, DPT Acute [facility] Services Office [phone]    Alayne ONEIDA Daring 06/07/2024, 3:10 PM

## 2024-06-07 NOTE — Progress Notes (Signed)
 [facility] PROGRESS NOTE    Progress Note  [name]  FMW:[id number] DOB: [birth year] DOA: 03/04/2024 PCP: Tanda Bleacher, MD     Brief Narrative:   [name] is an 58 y.o. male with history significant for COPD, cocaine abuse, presented to the ED 03/04/24 for slower speech, confusion and right-sided weakness, CT head showed left MCA infarct, CTA head and neck revealed an M2 occlusion on the left,   Echocardiogram revealed large mobile left ventricular thrombus on the left and right ventricular apical thrombus in the right, ejection fraction less than 20%.  Cardiology was consulted recommended DOAC.  PT evaluated patient and recommended skilled nursing facility placement.  Patient was found to lack medical decision capacity/status and not amenable to determining his discharge disposition or leave AGAINST MEDICAL ADVICE.   He intermittently declines physical exams and medications. TOC is pursuing guardianship with APS as he is lacking medical decision capacity. Psychiatry presently recommending to await APS decision for guardianship before discharging home.  Patient does not qualify for forced medication administration   Assessment/Plan:   Acute CVA (cerebrovascular accident) (HCC) Underwent extensive workup and imaging.  TEE was noted for large mobile mass EF 20%.  Reduced right ventricular function. Cardiology was consulted recommended Xarelto , he has remained noncompliance with this medication as an outpatient. Psychiatry deemed the patient to lack capacity. He continues to refuse his antihypertensive medication intermittently. Have explained to him the risk and benefits.  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 Last 2D echo showed EF 20%. Continue Xarelto .  Schizoaffective disorder/bipolar disorder/lacks medical capacity: Psychiatry recommended APS guardianship as the  patient lacks capacity to make medical decisions. Patient should not leave AGAINST MEDICAL ADVICE. Awaiting APS guardianship.  Essential hypertension: He refuses medication intermittently, continue current regimen of Coreg  Lasix  losartan  and Aldactone .  Hyperlipidemia: Continue Crestor .  Diabetes mellitus type 2: Last A1c of 5.4.  Polysubstance abuse: Noted UDS was positive for cocaine.  Deconditioning: He refuses therapy.  History of DVT: Continue Xarelto    DVT prophylaxis: Xarelto  Family Communication:none Status is: Inpatient Remains inpatient appropriate because: Awaiting APS guardianship    Code Status:     Code Status Orders  (From admission, onward)           Start     Ordered   03/04/24 1733  Full code  Continuous       Question:  By:  Answer:  Consent: discussion documented in EHR   03/04/24 1733           Code Status History     Date Active Date Inactive Code Status Order ID Comments User Context   01/14/2024 0224 01/18/2024 2101 Full Code [id number]  Debby Camila LABOR, MD ED   01/02/2024 1725 01/06/2024 2117 Full Code [id number]  Claudene Maximino LABOR, MD ED   12/20/2023 0601 12/22/2023 1832 Full Code [id number]  Charlton Evalene RAMAN, MD ED   10/22/2023 1835 10/24/2023 1159 Full Code [id number]  Franklyn Sid SAILOR, MD ED   12/07/2022 1712 06/16/2023 1820 Full Code [id number]  Rockey Denece LABOR, DO ED   11/24/2022 0745 12/03/2022 1902 Full Code [id number]  Celinda Alm Lot, MD ED   11/03/2022 0253 11/03/2022 1937 Full Code [id number]  Jarold Olam HERO, PA-C ED   08/17/2022 0142 10/09/2022 2035 Full Code [id number]  Debby Camila LABOR, MD ED   07/12/2022 1026 07/25/2022 2113 Full Code [id number]  Celinda Alm Lot, MD ED   04/04/2022 1240 04/11/2022 1710 Full  Code [id number]  Celinda Alm Lot, MD ED   04/03/2022 1411 04/04/2022 1240 Full Code [id number]  Logan Ubaldo KATHEE DEVONNA ED   03/23/2022 2112 03/31/2022 1517 Full Code [id number]  Perri DELENA Meliton Mickey., MD Inpatient   02/26/2022 1812  03/07/2022 1908 Full Code [id number]  Rockey Denece DELENA, DO Inpatient   01/29/2022 1515 02/05/2022 1533 Full Code [id number]  Waddell Rake, MD ED   12/03/2021 0841 12/09/2021 1457 Full Code [id number]  Kenard Zachary PARAS, MD ED   11/24/2021 2315 11/25/2021 2004 Full Code [id number]  Muthersbaugh, Chiquita DEVONNA ED   11/13/2021 1653 11/21/2021 1748 Full Code [id number]  Celinda Alm Lot, MD Inpatient   11/02/2021 2000 11/04/2021 2331 Full Code [id number]  Charlyn Sora, MD ED   07/03/2021 1143 07/05/2021 0057 Full Code [id number]  Jerrol Agent, MD ED   03/02/2021 1936 03/12/2021 1811 Full Code [id number]  Tobie Jorie SAUNDERS, MD ED   02/10/2021 2345 02/13/2021 1958 Full Code [id number]  Jarold Olam HERO, PA-C ED   02/09/2021 1953 02/10/2021 1536 Full Code [id number]  Brien Therisa MATSU, MD ED   12/17/2020 1516 02/03/2021 1650 Full Code [id number]  Barbar Darice PARAS, NP ED   12/08/2020 1336 12/10/2020 1931 Full Code [id number]  Malvina Ellen, MD ED   11/13/2020 2028 11/16/2020 1520 Full Code [id number]  Rockey Denece DELENA, DO ED   10/26/2020 1039 10/31/2020 2157 Full Code [id number]  Kriste Emeline BRAVO, MD ED   10/15/2020 0611 10/18/2020 1810 Full Code [id number]  Chotiner, Adine RAMAN, MD Inpatient   10/02/2020 2247 10/07/2020 1809 Full Code [id number]  Charlton Evalene RAMAN, MD ED   09/25/2020 1559 09/26/2020 2002 Full Code [id number]  Tobie Mario GAILS, MD ED   09/09/2020 1251 09/11/2020 1546 Full Code [id number]  Rojelio Nest, DO Inpatient   09/07/2020 1753 09/09/2020 1251 DNR [id number]  Rockey Denece DELENA, DO Inpatient   07/21/2020 1700 07/23/2020 2001 Full Code [id number]  [name] Fonda POUR, MD ED   07/07/2020 1529 07/07/2020 2109 Full Code [id number]  Cottie Donnice PARAS, MD ED   06/24/2019 0131 06/24/2019 1630 Full Code [id number]  Court Selinda DELENA, NP Inpatient   06/24/2019 0130 06/24/2019 0131 Full Code [id number]  Debby Fortes, NP Inpatient   06/21/2019 0941 06/22/2019 2007 Full Code [id number]  Madelon Pizza, DO Inpatient   05/24/2019 1117 05/26/2019 1836 Full Code  [id number]  Lanny Maxwell, MD ED   04/18/2019 0149 04/19/2019 1628 DNR [id number]  Kerman Soulier, MD ED   04/04/2019 0555 04/06/2019 1413 Full Code [id number]  Kelby Schatz, MD ED   03/27/2019 2130 03/29/2019 1900 Full Code [id number]  [name] Fonda POUR, MD ED   03/04/2019 1600 03/09/2019 1222 Full Code [id number]  Feliberto Cambric, MD ED   02/13/2018 0439 02/14/2018 1333 Full Code [id number]  Carita Senior, MD ED   12/26/2016 0547 12/29/2016 2021 Full Code [id number]  Kandyce Norleen DASEN, MD Inpatient   11/27/2016 1541 11/28/2016 1319 Full Code [id number]  Tracey Bruckner, PA-C ED   10/29/2016 1415 10/31/2016 1150 Full Code [id number]  Patsey Lot, MD ED   09/06/2016 2230 09/07/2016 1350 Full Code [id number]  Josem Bring, MD ED   09/06/2016 0934 09/06/2016 1529 Full Code [id number]  Nelta Jackye Saha, MD ED   09/02/2016 0045 09/04/2016 2127 Full Code [id number]  Terryl Kubas, NP ED   07/30/2016 1357 08/06/2016 2031 Full Code [id number]  Reva Majel RAMAN, FNP Inpatient   07/30/2016 0143 07/30/2016 1153 Full Code [id number]  Haze Bruckner PARAS, MD ED   05/30/2016 1622 06/05/2016 1454 Full Code  [id number]  Nicholaus Leita LABOR, NP Inpatient   05/29/2016 1635 05/30/2016 1622 Full Code [id number]  Emil Share, DO ED   12/02/2011 0245 12/12/2011 0720 Full Code [id number]  Desiderio Chew, PA ED         IV Access:   Peripheral IV   Procedures and diagnostic studies:   No results found.   Medical Consultants:   None.   Subjective:    [name] no new complaints this morning  Objective:    Vitals:   06/05/24 2009 06/06/24 0001 06/06/24 0932 06/06/24 2330  BP: (!) 177/110 (!) 155/105 (!) 131/95 (!) 137/96  Pulse: 68 73  78  Resp: 18 18  18   Temp: 98.6 F (37 C) 98 F (36.7 C)    TempSrc:      SpO2: 100% 98%  97%  Weight:      Height:       SpO2: 97 % O2 Flow Rate (L/min): 2 L/min FiO2 (%): 28 %  No intake or output data in the 24 hours ending 06/07/24 0714  Filed Weights   03/26/24 0431 03/28/24 0456  04/04/24 0500  Weight: 42.7 kg 47.5 kg 61.7 kg    Exam: General exam: In no acute distress. Respiratory system: Good air movement and clear to auscultation. Cardiovascular system: S1 & S2 heard, RRR. No JVD. Gastrointestinal system: Abdomen is nondistended, soft and nontender.  Extremities: No pedal edema. Skin: No rashes, lesions or ulcers Psychiatry: Poor judgment and insight of medical condition.   Data Reviewed:    Labs: Basic Metabolic Panel: No results for input(s): NA, K, CL, CO2, GLUCOSE, BUN, CREATININE, CALCIUM , MG, PHOS in the last 168 hours. GFR CrCl cannot be calculated (Patient's most recent lab result is older than the maximum 21 days allowed.). Liver Function Tests: No results for input(s): AST, ALT, ALKPHOS, BILITOT, PROT, ALBUMIN in the last 168 hours. No results for input(s): LIPASE, AMYLASE in the last 168 hours. No results for input(s): AMMONIA in the last 168 hours. Coagulation profile No results for input(s): INR, PROTIME in the last 168 hours. COVID-19 Labs  No results for input(s): DDIMER, FERRITIN, LDH, CRP in the last 72 hours.  Lab Results  Component Value Date   SARSCOV2NAA NEGATIVE 01/13/2024   SARSCOV2NAA NEGATIVE 01/02/2024   SARSCOV2NAA NEGATIVE 12/20/2023   SARSCOV2NAA NEGATIVE 11/23/2022    CBC: No results for input(s): WBC, NEUTROABS, HGB, HCT, MCV, PLT in the last 168 hours. Cardiac Enzymes: No results for input(s): CKTOTAL, CKMB, CKMBINDEX, TROPONINI in the last 168 hours. BNP (last 3 results) No results for input(s): PROBNP in the last 8760 hours. CBG: No results for input(s): GLUCAP in the last 168 hours. D-Dimer: No results for input(s): DDIMER in the last 72 hours. Hgb A1c: No results for input(s): HGBA1C in the last 72 hours. Lipid Profile: No results for input(s): CHOL, HDL, LDLCALC, TRIG, CHOLHDL, LDLDIRECT in the last 72  hours. Thyroid  function studies: No results for input(s): TSH, T4TOTAL, T3FREE, THYROIDAB in the last 72 hours.  Invalid input(s): FREET3 Anemia work up: No results for input(s): VITAMINB12, FOLATE, FERRITIN, TIBC, IRON, RETICCTPCT in the last 72 hours. Sepsis Labs: No results for input(s): PROCALCITON, WBC, LATICACIDVEN in the last 168 hours. Microbiology No results found for this or any previous visit (from the past 240 hours).   Medications:    carvedilol   10 mg Oral Daily   furosemide   40 mg Oral Daily   gabapentin   200 mg Oral BID   lisinopril   40  mg Oral Daily   revefenacin   175 mcg Nebulization Daily   rivaroxaban   20 mg Oral Q supper   rosuvastatin   5 mg Oral QHS   spironolactone   25 mg Oral Daily   Continuous Infusions:    LOS: 95 days   Erle Odell Castor  [facility]  06/07/2024, 7:14 AM

## 2024-06-08 DIAGNOSIS — I639 Cerebral infarction, unspecified: Secondary | ICD-10-CM | POA: Diagnosis not present

## 2024-06-08 NOTE — Plan of Care (Signed)
   Problem: Nutrition: Goal: Adequate nutrition will be maintained Outcome: Progressing   Problem: Coping: Goal: Level of anxiety will decrease Outcome: Progressing   Problem: Elimination: Goal: Will not experience complications related to bowel motility Outcome: Progressing

## 2024-06-08 NOTE — Progress Notes (Signed)
 Progress Note    [name]   FMW:[id number]  DOB: [birth year]  DOA: 03/04/2024     96 PCP: Tanda Bleacher, MD  Initial CC: Slurred speech, confusion  Hospital Course: 58 year old with history significant for COPD, cocaine abuse, presented to the ED 03/04/24 for slower speech, confusion and right-sided weakness, CT head showed left MCA infarct, CTA head and neck revealed an M2 occlusion on the left,   Echocardiogram revealed large mobile left ventricular thrombus on the left and right ventricular apical thrombus in the right, ejection fraction less than 20%.  Cardiology was consulted recommended DOAC.  PT evaluated patient and recommended skilled nursing facility placement.  Patient was found to lack medical decision capacity/status and not amenable to determining his discharge disposition or leave AGAINST MEDICAL ADVICE.   He intermittently declines physical exams and medications. TOC is pursuing guardianship with APS as he is lacking medical decision capacity. Psychiatry presently recommending to await APS decision for guardianship before discharging home.  Patient does not qualify for forced medication administration   No changes in medical condition.   Assessment & Plan:  Principal Problem:   Acute CVA (cerebrovascular accident) (HCC) Active Problems:   LV (left ventricular) mural thrombus   Chronic systolic CHF (congestive heart failure) (HCC)   Prolonged QT interval   Elevated liver enzymes   COPD (chronic obstructive pulmonary disease) (HCC)   Cocaine abuse (HCC)   Schizoaffective disorder, bipolar type (HCC)   Benign essential HTN   Acid reflux   History of DVT (deep vein thrombosis)   Type 2 diabetes mellitus (HCC)   Status post bilateral BKA.   Dilated cardiomyopathy (HCC)    Acute CVA (cerebrovascular accident) (HCC) -He underwent extensive workup and imaging revealed left MCA territory infarct, caudate nucleus, lentiform nucleus and internal capsule MRI  confirmed. -TEE was noted for large mobile left lower clot just under the mitral valve with an EF of 20% and a small right apical clot with reduced RV systolic function. -He was started on Eliquis  for intracardiac thrombus, however he remained noncompliant.  In order to improve compliance, medicine changed to Xarelto  for once daily dosing -Psychiatry has deemed the patient to lack capacity; he continues to remain noncompliant at times -Unable to force meds.  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  Eliquis  changed to Xarelto , Coreg  long-acting.   Schizoaffective disorder bipolar disorder lacks medical capacity: Psych was consulted and recommended that the patient lacks medical capacity to make decisions if the patient attempts to leave AGAINST MEDICAL ADVICE he should be involuntarily committed. Psych recommending APS guardianship, TOC in touch with APS regarding this as patient lacks medical capacity   Essential hypertension:  Pressures are labile.  Currently on Coreg , Lasix , losartan , Aldactone .  Intermittent noncompliance  Hyperlipidemia: Continue Crestor .   Diabetes mellitus type 2:  last A1c of 5.4. refuses monitoring    Polysubstance abuse: UDS was positive for cocaine and benzos.   Deconditioning: Ambulatory status complicated by previous right AKA and left BKA has no insurance coverage for SNF. - refusing therapy at times   History of COPD: Continue inhalers   History of DVT: On Xarelto  but frequently refuses   Estimated body mass index is 20.09 kg/m as calculated from the following:   Height as of this encounter: 5' 9 (1.753 m).  Weight as of this encounter: 61.7 kg   Interval History:  No complaints.   Antimicrobials:   DVT prophylaxis:    rivaroxaban  (XARELTO ) tablet 20 mg   Code Status:   Code Status: Full Code  Mobility Assessment (Last 72 Hours)     Mobility Assessment     Row Name 06/08/24 0200 06/08/24 0000 06/07/24 2042 06/07/24 0830 06/06/24 2000   Does patient have an order for bedrest or is patient medically unstable No - Continue assessment No - Continue assessment No - Continue assessment No - Continue assessment No - Continue assessment   What is the highest level of mobility based on the progressive mobility assessment? Level 2 (Chairfast) - Balance while sitting on edge of bed and cannot stand Level 2 (Chairfast) - Balance while sitting on edge of bed and cannot stand Level 2 (Chairfast) - Balance while sitting on edge of bed and cannot stand Level 2 (Chairfast) - Balance while sitting on edge of bed and cannot stand Level 2 (Chairfast) - Balance while sitting on edge of bed and cannot stand   Is the above level different from baseline mobility prior to current illness? -- -- -- Yes - Recommend PT order Yes - Recommend PT order    Row Name 06/06/24 1000           Does patient have an order for bedrest or is patient medically unstable No - Continue assessment       What is the highest level of mobility based on the progressive mobility assessment? Level 2 (Chairfast) - Balance while sitting on edge of bed and cannot stand       Is the above level different from baseline mobility prior to current illness? No - Consider discontinuing PT/OT          Barriers to discharge: APS to obtain guardianship Disposition Plan:  TBD HH orders placed: n/a Status is: Inpt  Objective: Blood pressure 122/80, pulse 67, temperature 98 F (36.7 C), resp. rate 17, height 5' 9 (1.753 m), weight 61.7 kg, SpO2 98%.  Examination:  General:  NAD Lungs: Normal resp effort Extremities: Left BKA, Right AKA   Consultants:  Psych      LOS: 96 days   [name], MD [facility] 06/08/2024, 8:00 AM

## 2024-06-09 DIAGNOSIS — I639 Cerebral infarction, unspecified: Secondary | ICD-10-CM | POA: Diagnosis not present

## 2024-06-09 NOTE — Progress Notes (Signed)
 Chaplain attempted to meet with pt, but he was focused on looking for a particular note and then received a phone call. I asked if it would be better for me to visit later, and he told me that was fine. His overall disposition was much less welcoming today than in the past, however.

## 2024-06-09 NOTE — Plan of Care (Signed)
  Problem: Nutrition: Goal: Adequate nutrition will be maintained Outcome: Progressing   Problem: Coping: Goal: Level of anxiety will decrease Outcome: Progressing   Problem: Elimination: Goal: Will not experience complications related to bowel motility Outcome: Progressing Goal: Will not experience complications related to urinary retention Outcome: Progressing   Problem: Pain Managment: Goal: General experience of comfort will improve and/or be controlled Outcome: Progressing   Problem: Safety: Goal: Ability to remain free from injury will improve Outcome: Progressing

## 2024-06-09 NOTE — Progress Notes (Signed)
 Chaplain made follow up visit. Pt much more welcoming and spoke with chaplain regarding court case/social situation. At pt's request, chaplain provided phone numbers for [facility], [place] and [name].

## 2024-06-09 NOTE — Progress Notes (Signed)
 Occupational Therapy Treatment Patient Details Name: [name] MRN: [id number] DOB: 12-11-1965 Today's Date: 06/09/2024   History of present illness Patient is 58 y.o. male presented to ED on 03/04/24 with stroke like symptoms when caregiver found him with slurred speech and right sided weakness. Head CT reveals Acute left MCA territory infarct affecting the insula, caudate nucleus, lentiform nucleus and internal capsule. PMH significant of  Schizoaffective disorder, heart failure with reduced ejection fraction, pretension, DMII, COPD, s/p bilateral amputations, chronic pain, and polysubstance abuse.   OT comments  Patient seen by skilled OT to address shower transfer, bathing in shower and dressing. Patient demonstrating good gains with shower transfer with CGA from min assists for lateral scoot transfer from wheelchair to tub bench and back to wheelchair. Patient able to bathe self seated on shower bench with supervision and assistance with tieing gown only.  Acute OT to continue to follow to address established goals to facilitate DC to next venue of care.        If plan is discharge home, recommend the following:  A little help with walking and/or transfers;A little help with bathing/dressing/bathroom;Supervision due to cognitive status;Direct supervision/assist for financial management;Direct supervision/assist for medications management   Equipment Recommendations  None recommended by OT    Recommendations for Other Services      Precautions / Restrictions Precautions Precautions: Fall Recall of Precautions/Restrictions: Impaired Precaution/Restrictions Comments: h/o BLE amputations Restrictions Weight Bearing Restrictions Per Provider Order: No       Mobility Bed Mobility Overal bed mobility: Modified Independent             General bed mobility comments: Pt in w/c on arrival    Transfers Overall transfer level: Needs assistance Equipment used: None Transfers:  Bed to chair/wheelchair/BSC            Lateral/Scoot Transfers: Contact guard assist General transfer comment: transfer to/from tub bench to/from wheelchair with CGA for safety     Balance Overall balance assessment: Mild deficits observed, not formally tested Sitting-balance support: Bilateral upper extremity supported Sitting balance-Leahy Scale: Good Sitting balance - Comments: able to maintain sitting balance on tub bench                                   ADL either performed or assessed with clinical judgement   ADL Overall ADL's : Needs assistance/impaired     Grooming: Wash/dry hands;Independent;Sitting Grooming Details (indicate cue type and reason): chair level at sink Upper Body Bathing: Supervision/ safety;Sitting Upper Body Bathing Details (indicate cue type and reason): in shower Lower Body Bathing: Supervison/ safety;Sitting/lateral leans Lower Body Bathing Details (indicate cue type and reason): in shower Upper Body Dressing : Set up;Sitting Upper Body Dressing Details (indicate cue type and reason): to donn gown             Tub/ Shower Transfer: Contact guard assist;Tub Building surveyor Details (indicate cue type and reason): lateral scoot transfer with CGA for safety Functional mobility during ADLs: Supervision/safety;Wheelchair General ADL Comments: demonstrating gains with tub bench  transers    Patent attorney Communication Communication: Impaired Factors Affecting Communication: Difficulty expressing self;Reduced clarity of speech   Cognition Arousal: Alert Behavior During Therapy: WFL for tasks assessed/performed Cognition: Cognition impaired Difficult to  assess due to: Impaired communication   Awareness: Intellectual awareness intact, Online awareness impaired Memory impairment (select all impairments): Working memory Attention  impairment (select first level of impairment): Selective attention Executive functioning impairment (select all impairments): Problem solving                   Following commands: Intact Following commands impaired: Follows one step commands inconsistently, Follows one step commands with increased time      Cueing   Cueing Techniques: Verbal cues  Exercises      Shoulder Instructions       General Comments      Pertinent Vitals/ Pain       Pain Assessment Pain Assessment: No/denies pain Pain Intervention(s): Monitored during session  Home Living                                          Prior Functioning/Environment              Frequency  Min 1X/week        Progress Toward Goals  OT Goals(current goals can now be found in the care plan section)  Progress towards OT goals: Progressing toward goals  Acute Rehab OT Goals Patient Stated Goal: to have a discharge plan OT Goal Formulation: With patient Time For Goal Achievement: 06/25/24 Potential to Achieve Goals: Fair ADL Goals Pt Will Perform Lower Body Bathing: with mod assist;sitting/lateral leans Pt Will Perform Lower Body Dressing: with modified independence;bed level Pt Will Transfer to Toilet: with mod assist;with transfer board;bedside commode Pt Will Perform Toileting - Clothing Manipulation and hygiene: with mod assist;sitting/lateral leans Pt/caregiver will Perform Home Exercise Program: Right Upper extremity;Independently;With written HEP provided (to increase coordination with use of RUE) Additional ADL Goal #1: Pt will transfer to w/c with Mod I as a precursor for OOB ADLs Additional ADL Goal #2: Pt will utilize RUE to gather 5/5 objects without overshooting. Additional ADL Goal #3: Pt will demonstrate proper safety with transfers/mobility 100% of the time during session.  Plan      Co-evaluation                 AM-PAC OT 6 Clicks Daily Activity     Outcome  Measure   Help from another person eating meals?: None Help from another person taking care of personal grooming?: A Little Help from another person toileting, which includes using toliet, bedpan, or urinal?: A Lot Help from another person bathing (including washing, rinsing, drying)?: A Little Help from another person to put on and taking off regular upper body clothing?: A Little Help from another person to put on and taking off regular lower body clothing?: A Little 6 Click Score: 18    End of Session Equipment Utilized During Treatment: Other (comment) (wheelchair, tub bench)  OT Visit Diagnosis: Unsteadiness on feet (R26.81);Other abnormalities of gait and mobility (R26.89);Muscle weakness (generalized) (M62.81);Other symptoms and signs involving the nervous system (R29.898);Other symptoms and signs involving cognitive function;Hemiplegia and hemiparesis Hemiplegia - Right/Left: Right Hemiplegia - dominant/non-dominant: Dominant Hemiplegia - caused by: Cerebral infarction   Activity Tolerance Patient tolerated treatment well   Patient Left in chair;with call bell/phone within reach   Nurse Communication Mobility status        Time: 8880-8854 OT Time Calculation (min): 26 min  Charges: OT General Charges $OT Visit: 1 Visit OT Treatments $Self Care/Home Management : 23-37  mins  [name], OTA Acute Rehabilitation Services  Office [phone]   [name] 06/09/2024, 2:09 PM

## 2024-06-09 NOTE — Progress Notes (Incomplete)
 Patient refused vital signs this shift. Education provided, will continue to monitor

## 2024-06-09 NOTE — Progress Notes (Signed)
 Progress Note    [name]   FMW:[id number]  DOB: 02/01/1966  DOA: 03/04/2024     97 PCP: Tanda Bleacher, MD  Initial CC: Slurred speech, confusion  Hospital Course: 58 year old with history significant for COPD, cocaine abuse, presented to the ED 03/04/24 for slower speech, confusion and right-sided weakness, CT head showed left MCA infarct, CTA head and neck revealed an M2 occlusion on the left,   Echocardiogram revealed large mobile left ventricular thrombus on the left and right ventricular apical thrombus in the right, ejection fraction less than 20%.  Cardiology was consulted recommended DOAC.  PT evaluated patient and recommended skilled nursing facility placement.  Patient was found to lack medical decision capacity/status and not amenable to determining his discharge disposition or leave AGAINST MEDICAL ADVICE.   He intermittently declines physical exams and medications. TOC is pursuing guardianship with APS as he is lacking medical decision capacity. Psychiatry presently recommending to await APS decision for guardianship before discharging home.  Patient does not qualify for forced medication administration   No changes in medical condition.   Assessment & Plan:  Principal Problem:   Acute CVA (cerebrovascular accident) (HCC) Active Problems:   LV (left ventricular) mural thrombus   Chronic systolic CHF (congestive heart failure) (HCC)   Prolonged QT interval   Elevated liver enzymes   COPD (chronic obstructive pulmonary disease) (HCC)   Cocaine abuse (HCC)   Schizoaffective disorder, bipolar type (HCC)   Benign essential HTN   Acid reflux   History of DVT (deep vein thrombosis)   Type 2 diabetes mellitus (HCC)   Status post bilateral BKA.   Dilated cardiomyopathy (HCC)    Acute CVA (cerebrovascular accident) (HCC) -He underwent extensive workup and imaging revealed left MCA territory infarct, caudate nucleus, lentiform nucleus and internal capsule MRI  confirmed. -TEE was noted for large mobile left lower clot just under the mitral valve with an EF of 20% and a small right apical clot with reduced RV systolic function. -He was started on Eliquis  for intracardiac thrombus, however he remained noncompliant.  In order to improve compliance, medicine changed to Xarelto  for once daily dosing -Psychiatry has deemed the patient to lack capacity; he continues to remain noncompliant at times -Unable to force meds.  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  Eliquis  changed to Xarelto , Coreg  long-acting.   Schizoaffective disorder bipolar disorder lacks medical capacity: Psych was consulted and recommended that the patient lacks medical capacity to make decisions if the patient attempts to leave AGAINST MEDICAL ADVICE he should be involuntarily committed. Psych recommending APS guardianship, TOC in touch with APS regarding this as patient lacks medical capacity   Essential hypertension:  Pressures are labile.  Currently on Coreg , Lasix , losartan , Aldactone .  Intermittent noncompliance  Hyperlipidemia: Continue Crestor .   Diabetes mellitus type 2:  last A1c of 5.4. refuses monitoring    Polysubstance abuse: UDS was positive for cocaine and benzos.   Deconditioning: Ambulatory status complicated by previous right AKA and left BKA has no insurance coverage for SNF. - refusing therapy at times   History of COPD: Continue inhalers   History of DVT: On Xarelto  but frequently refuses   Estimated body mass index is 20.09 kg/m as calculated from the following:   Height as of this encounter: 5' 9 (1.753 m).  Weight as of this encounter: 61.7 kg   Interval History:  Seen in his room,. No new complaints.    Antimicrobials:   DVT prophylaxis:   rivaroxaban  (XARELTO ) tablet 20 mg   Code Status:   Code Status: Full Code  Mobility Assessment (Last 72 Hours)     Mobility Assessment     Row Name 06/08/24 2100 06/08/24 0830 06/08/24 0200 06/08/24 0000 06/07/24 2042   Does patient have an order for bedrest or is patient medically unstable No - Continue assessment No - Continue assessment No - Continue assessment No - Continue assessment No - Continue assessment   What is the highest level of mobility based on the progressive mobility assessment? Level 2 (Chairfast) - Balance while sitting on edge of bed and cannot stand Level 2 (Chairfast) - Balance while sitting on edge of bed and cannot stand Level 2 (Chairfast) - Balance while sitting on edge of bed and cannot stand Level 2 (Chairfast) - Balance while sitting on edge of bed and cannot stand Level 2 (Chairfast) - Balance while sitting on edge of bed and cannot stand   Is the above level different from baseline mobility prior to current illness? Yes - Recommend PT order Yes - Recommend PT order -- -- --    Row Name 06/07/24 0830 06/06/24 2000         Does patient have an order for bedrest or is patient medically unstable No - Continue assessment No - Continue assessment      What is the highest level of mobility based on the progressive mobility assessment? Level 2 (Chairfast) - Balance while sitting on edge of bed and cannot stand Level 2 (Chairfast) - Balance while sitting on edge of bed and cannot stand      Is the above level different from baseline mobility prior to current illness? Yes - Recommend PT order Yes - Recommend PT order         Barriers to discharge: APS to obtain guardianship Disposition Plan:  TBD HH orders placed: n/a Status is: Inpt  Objective: Blood pressure (!) 145/96, pulse 78, temperature 98 F (36.7 C), resp. rate 18, height 5' 9 (1.753 m), weight 61.7 kg, SpO2 99%.  Examination:  General:  NAD Lungs: Normal  resp effort Extremities: Left BKA, Right AKA   Consultants:  Psych      LOS: 97 days   [name], MD [facility] 06/09/2024, 12:02 PM

## 2024-06-10 DIAGNOSIS — I639 Cerebral infarction, unspecified: Secondary | ICD-10-CM | POA: Diagnosis not present

## 2024-06-10 NOTE — Plan of Care (Signed)
 Very argumentative today.  Upset about what nurse he was assigned today.  Nurses traded to meet his needs and prevent him from being upset and Unit Manager did come to his bedside to speak with him about his issues.  Refused some of his meds and allowed some to be given.  Had to be reminded that if he is going to allow BP meds to be given, we have to know what his BP is.  He did agree.    Problem: Coping: Goal: Level of anxiety will decrease Outcome: Progressing   Problem: Safety: Goal: Ability to remain free from injury will improve Outcome: Progressing   Problem: Skin Integrity: Goal: Risk for impaired skin integrity will decrease Outcome: Progressing

## 2024-06-10 NOTE — Progress Notes (Signed)
 Followed up with [name] - with his permission, will meet with him to talk more in depth on Tuesday.

## 2024-06-10 NOTE — TOC Progression Note (Signed)
 Transition of Care [facility]) - Progression Note    Patient Details  Name: [name] MRN: [id number] Date of Birth: [birth year]  [name]) CM/SW Contact  [name], [name] Phone Number: 06/10/2024, 3:52 PM  Clinical Narrative:   CSW met with patient per request to discuss concerns related to housing and support services. Lengthy discussion with patient that he doesn't feel like the people working with him at [place] or [name] are helping him, and they are withholding his key from him, that he needs to turn in to the Investment banker, corporate. CSW coordinated with [name] and [name] about patient's concerns, they are working to get an extension of his move out date so that a determination can be made on guardianship to then figure out who is responsible for the patient's belongings in the apartment. Meeting is scheduled to discuss the patient's case for next week.     Expected Discharge Plan: Skilled Nursing Facility Barriers to Discharge: English as a second language teacher, Active Substance Use - Placement, SNF Pending bed offer  Expected Discharge Plan and Services       Living arrangements for the past 2 months: Apartment Expected Discharge Date: 03/14/24                                     Social Determinants of Health (SDOH) Interventions SDOH Screenings   Food Insecurity: Food Insecurity Present (03/06/2024)  Housing: High Risk (03/06/2024)  Transportation Needs: No Transportation Needs (03/06/2024)  Utilities: Patient Declined (03/06/2024)  Alcohol Screen: Medium Risk (06/24/2019)  Depression (PHQ2-9): Low Risk  (02/18/2024)  Recent Concern: Depression (PHQ2-9) - Medium Risk (01/21/2024)  Financial Resource Strain: High Risk (07/24/2021)   Received from [facility]  Physical Activity: Inactive (02/18/2024)  Social Connections: Unknown (04/05/2022)   [facility] from [facility]  Stress: Stress Concern Present (07/24/2021)   Received from [facility]  Tobacco Use: High Risk (03/04/2024)  Health Literacy: Adequate Health Literacy (02/18/2024)    Readmission Risk Interventions    12/21/2023    9:25 AM 12/09/2022    9:02 AM 12/01/2022    3:56 PM  Readmission Risk Prevention Plan  Transportation Screening Complete Complete Complete  HRI or Home Care Consult Complete    Social Work Consult for Recovery Care Planning/Counseling Complete    Palliative Care Screening Not Applicable    Medication Review Oceanographer) Complete Complete Complete  PCP or Specialist appointment within 3-5 days of discharge  Complete Complete  HRI or Home Care Consult  Complete Complete  SW Recovery Care/Counseling Consult  Complete Complete  Palliative Care Screening  Not Applicable Not Applicable  Skilled Nursing Facility  Not Applicable Not Applicable  SNF Comments   n/a

## 2024-06-10 NOTE — Plan of Care (Signed)
  Problem: Safety: Goal: Ability to remain free from injury will improve Outcome: Progressing   Problem: Skin Integrity: Goal: Risk for impaired skin integrity will decrease Outcome: Progressing   Problem: Coping: Goal: Level of anxiety will decrease Outcome: Progressing   Problem: Elimination: Goal: Will not experience complications related to bowel motility Outcome: Progressing Goal: Will not experience complications related to urinary retention Outcome: Progressing

## 2024-06-10 NOTE — TOC Progression Note (Signed)
 Transition of Care [facility]) - Progression Note    Patient Details  Name: [name] MRN: [id number] Date of Birth: 1966-02-23  Transition [facility]) CM/SW Contact  [name], [name] Phone Number: 06/10/2024, 3:56 PM  Clinical Narrative:   CSW met with patient per request to discuss concerns. Per patient, he no longer needs assistance in trying to fire [employer] or [name], but he wants to start looking into group homes. CSW discussed that nothing can be done about that until after his court hearing. Patient indicated understanding.  CSW spoke with [facility] case manager to provide update on patient and confirm meeting scheduled for Monday. Patient is NOT expected to participate in the meeting that is scheduled. CSW to follow.    Expected Discharge Plan: Skilled Nursing Facility Barriers to Discharge: English as a second language teacher, Active Substance Use - Placement, SNF Pending bed offer  Expected Discharge Plan and Services       Living arrangements for the past 2 months: Apartment Expected Discharge Date: 03/14/24                                     Social Determinants of Health (SDOH) Interventions SDOH Screenings   Food Insecurity: Food Insecurity Present (03/06/2024)  Housing: High Risk (03/06/2024)  Transportation Needs: No Transportation Needs (03/06/2024)  Utilities: Patient Declined (03/06/2024)  Alcohol Screen: Medium Risk (06/24/2019)  Depression (PHQ2-9): Low Risk  (02/18/2024)  Recent Concern: Depression (PHQ2-9) - Medium Risk (01/21/2024)  Financial Resource Strain: High Risk (07/24/2021)   Received from [facility]  Physical Activity: Inactive (02/18/2024)  Social Connections: Unknown (04/05/2022)   Received from [facility]  Stress: Stress Concern Present (07/24/2021)   Received from [facility]  Tobacco Use: High Risk (03/04/2024)  Health Literacy: Adequate Health Literacy (02/18/2024)    Readmission Risk Interventions    12/21/2023    9:25  AM 12/09/2022    9:02 AM 12/01/2022    3:56 PM  Readmission Risk Prevention Plan  Transportation Screening Complete Complete Complete  HRI or Home Care Consult Complete    Social Work Consult for Recovery Care Planning/Counseling Complete    Palliative Care Screening Not Applicable    Medication Review Oceanographer) Complete Complete Complete  PCP or Specialist appointment within 3-5 days of discharge  Complete Complete  HRI or Home Care Consult  Complete Complete  SW Recovery Care/Counseling Consult  Complete Complete  Palliative Care Screening  Not Applicable Not Applicable  Skilled Nursing Facility  Not Applicable Not Applicable  SNF Comments   n/a

## 2024-06-10 NOTE — Progress Notes (Signed)
   06/10/24 1300  Interventions  Spiritual Care Interventions Made Other (comment) (Assisted with making a few phone calls)

## 2024-06-10 NOTE — Progress Notes (Signed)
 Progress Note    [name]   FMW:[id number]  DOB: [birth year]  DOA: 03/04/2024     98 PCP: Tanda Bleacher, MD  Initial CC: Slurred speech, confusion  Hospital Course: 58 year old with history significant for COPD, cocaine abuse, presented to the ED 03/04/24 for slower speech, confusion and right-sided weakness, CT head showed left MCA infarct, CTA head and neck revealed an M2 occlusion on the left,   Echocardiogram revealed large mobile left ventricular thrombus on the left and right ventricular apical thrombus in the right, ejection fraction less than 20%.  Cardiology was consulted recommended DOAC.  PT evaluated patient and recommended skilled nursing facility placement.  Patient was found to lack medical decision capacity/status and not amenable to determining his discharge disposition or leave AGAINST MEDICAL ADVICE.   He intermittently declines physical exams and medications. TOC is pursuing guardianship with APS as he is lacking medical decision capacity. Psychiatry presently recommending to await APS decision for guardianship before discharging home.  Patient does not qualify for forced medication administration   No changes in medical condition.   Assessment & Plan:  Principal Problem:   Acute CVA (cerebrovascular accident) (HCC) Active Problems:   LV (left ventricular) mural thrombus   Chronic systolic CHF (congestive heart failure) (HCC)   Prolonged QT interval   Elevated liver enzymes   COPD (chronic obstructive pulmonary disease) (HCC)   Cocaine abuse (HCC)   Schizoaffective disorder, bipolar type (HCC)   Benign essential HTN   Acid reflux   History of DVT (deep vein thrombosis)   Type 2 diabetes mellitus (HCC)   Status post bilateral BKA.   Dilated cardiomyopathy (HCC)    Acute CVA (cerebrovascular accident) (HCC) -He underwent extensive workup and imaging revealed left MCA territory infarct, caudate nucleus, lentiform nucleus and internal capsule MRI  confirmed. -TEE was noted for large mobile left lower clot just under the mitral valve with an EF of 20% and a small right apical clot with reduced RV systolic function. -He was started on Eliquis  for intracardiac thrombus, however he remained noncompliant.  In order to improve compliance, medicine changed to Xarelto  for once daily dosing -Psychiatry has deemed the patient to lack capacity; he continues to remain noncompliant at times -Unable to force meds.  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  Eliquis  changed to Xarelto , Coreg  long-acting.   Schizoaffective disorder bipolar disorder lacks medical capacity: Psych was consulted and recommended that the patient lacks medical capacity to make decisions if the patient attempts to leave AGAINST MEDICAL ADVICE he should be involuntarily committed. Psych recommending APS guardianship, TOC in touch with APS regarding this as patient lacks medical capacity   Essential hypertension:  Pressures are labile.  Currently on Coreg , Lasix , losartan , Aldactone .  Intermittent noncompliance  Hyperlipidemia: Continue Crestor .   Diabetes mellitus type 2:  last A1c of 5.4. refuses monitoring    Polysubstance abuse: UDS was positive for cocaine and benzos.   Deconditioning: Ambulatory status complicated by previous right AKA and left BKA has no insurance coverage for SNF. - refusing therapy at times   History of COPD: Continue inhalers   History of DVT: On Xarelto  but frequently refuses   Estimated body mass index is 20.09 kg/m as calculated from the following:   Height as of this encounter: 5' 9 (1.753 m).  Weight as of this encounter: 61.7 kg   Interval History:  No concerns Antimicrobials:   DVT prophylaxis:    rivaroxaban  (XARELTO ) tablet 20 mg   Code Status:   Code Status: Full Code  Mobility Assessment (Last 72 Hours)     Mobility Assessment     Row Name 06/10/24 0800 06/09/24 2200 06/09/24 2100 06/09/24 2000 06/09/24 1200   Does patient have an order for bedrest or is patient medically unstable No - Continue assessment No - Continue assessment No - Continue assessment No - Continue assessment --   What is the highest level of mobility based on the progressive mobility assessment? Level 2 (Chairfast) - Balance while sitting on edge of bed and cannot stand Level 2 (Chairfast) - Balance while sitting on edge of bed and cannot stand Level 2 (Chairfast) - Balance while sitting on edge of bed and cannot stand Level 2 (Chairfast) - Balance while sitting on edge of bed and cannot stand Level 2 (Chairfast) - Balance while sitting on edge of bed and cannot stand   Is the above level different from baseline mobility prior to current illness? Yes - Recommend PT order Yes - Recommend PT order Yes - Recommend PT order Yes - Recommend PT order --    Row Name 06/09/24 0745 06/08/24 2100 06/08/24 0830 06/08/24 0200 06/08/24 0000   Does patient have an order for bedrest or is patient medically unstable No - Continue assessment No - Continue assessment No - Continue assessment No - Continue assessment No - Continue assessment   What is the highest level of mobility based on the progressive mobility assessment? Level 2 (Chairfast) - Balance while sitting on edge of bed and cannot stand Level 2 (Chairfast) - Balance while sitting on edge of bed and cannot stand Level 2 (Chairfast) - Balance while sitting on edge of bed and cannot stand Level 2 (Chairfast) - Balance while sitting on edge of bed and cannot stand Level 2 (Chairfast) - Balance while sitting on edge of bed and cannot stand   Is the above level different from baseline mobility prior to current illness? Yes - Recommend PT order Yes - Recommend PT order Yes -  Recommend PT order -- --    Row Name 06/07/24 2042           Does patient have an order for bedrest or is patient medically unstable No - Continue assessment       What is the highest level of mobility based on the progressive mobility assessment? Level 2 (Chairfast) - Balance while sitting on edge of bed and cannot stand          Barriers to discharge: APS to obtain guardianship Disposition Plan:  TBD HH orders placed: n/a Status is: Inpt  Objective: Blood pressure (!) 158/103, pulse 68, temperature 98.1 F (36.7 C), temperature source Axillary, resp. rate 18, height 5' 9 (1.753 m), weight 61.7 kg, SpO2 99%.  Examination:  General:  NAD Lungs: Normal resp effort Extremities: Left BKA, Right AKA   Consultants:  Psych      LOS: 98 days   [name], MD [facility] 06/10/2024, 2:50 PM

## 2024-06-10 NOTE — Progress Notes (Signed)
 SLP Cancellation Note  Patient Details Name: Treylin Burtch MRN: [id number] DOB: [birth year]   Cancelled treatment:       Reason Eval/Treat Not Completed: Patient declined, no reason specified (Pt on phone and not wishing to participate in ST at this time. Will continue efforts as appropriate.)  Delon Bangs, M.S., CCC-SLP Speech-Language Pathologist Secure Chat Preferred  O: [phone]  Delon CHRISTELLA Bangs 06/10/2024, 10:55 AM

## 2024-06-11 ENCOUNTER — Inpatient Hospital Stay (HOSPITAL_COMMUNITY): Payer: MEDICAID

## 2024-06-11 DIAGNOSIS — I639 Cerebral infarction, unspecified: Secondary | ICD-10-CM | POA: Diagnosis not present

## 2024-06-11 LAB — GLUCOSE, CAPILLARY: Glucose-Capillary: 147 mg/dL — ABNORMAL HIGH (ref 70–99)

## 2024-06-11 LAB — BLOOD GAS, ARTERIAL
Acid-base deficit: 1.8 mmol/L (ref 0.0–2.0)
Bicarbonate: 23.1 mmol/L (ref 20.0–28.0)
Drawn by: 441371
O2 Saturation: 99.2 %
Patient temperature: 36.9
pCO2 arterial: 39 mmHg (ref 32–48)
pH, Arterial: 7.38 (ref 7.35–7.45)
pO2, Arterial: 104 mmHg (ref 83–108)

## 2024-06-11 NOTE — Progress Notes (Signed)
 Progress Note    [name]   FMW:[id number]  DOB: Aug 31, 1966  DOA: 03/04/2024     99 PCP: Tanda Bleacher, MD  Initial CC: Slurred speech, confusion  Hospital Course: 58 year old with history significant for COPD, cocaine abuse, presented to the ED 03/04/24 for slower speech, confusion and right-sided weakness, CT head showed left MCA infarct, CTA head and neck revealed an M2 occlusion on the left,   Echocardiogram revealed large mobile left ventricular thrombus on the left and right ventricular apical thrombus in the right, ejection fraction less than 20%.  Cardiology was consulted recommended DOAC.  PT evaluated patient and recommended skilled nursing facility placement.  Patient was found to lack medical decision capacity/status and not amenable to determining his discharge disposition or leave AGAINST MEDICAL ADVICE.   He intermittently declines physical exams and medications. TOC is pursuing guardianship with APS as he is lacking medical decision capacity. Psychiatry presently recommending to await APS decision for guardianship before discharging home.  Patient does not qualify for forced medication administration   No changes in medical condition.   Assessment & Plan:  Principal Problem:   Acute CVA (cerebrovascular accident) (HCC) Active Problems:   LV (left ventricular) mural thrombus   Chronic systolic CHF (congestive heart failure) (HCC)   Prolonged QT interval   Elevated liver enzymes   COPD (chronic obstructive pulmonary disease) (HCC)   Cocaine abuse (HCC)   Schizoaffective disorder, bipolar type (HCC)   Benign essential HTN   Acid reflux   History of DVT (deep vein thrombosis)   Type 2 diabetes mellitus (HCC)   Status post bilateral BKA.   Dilated cardiomyopathy (HCC)    Acute CVA (cerebrovascular accident) (HCC) -He underwent extensive workup and imaging revealed left MCA territory infarct, caudate nucleus, lentiform nucleus and internal capsule MRI  confirmed. -TEE was noted for large mobile left lower clot just under the mitral valve with an EF of 20% and a small right apical clot with reduced RV systolic function. -He was started on Eliquis  for intracardiac thrombus, however he remained noncompliant.  In order to improve compliance, medicine changed to Xarelto  for once daily dosing -Psychiatry has deemed the patient to lack capacity; he continues to remain noncompliant at times -Unable to force meds.  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  Eliquis  changed to Xarelto , Coreg  long-acting.   Schizoaffective disorder bipolar disorder lacks medical capacity: Psych was consulted and recommended that the patient lacks medical capacity to make decisions if the patient attempts to leave AGAINST MEDICAL ADVICE he should be involuntarily committed. Psych recommending APS guardianship, TOC in touch with APS regarding this as patient lacks medical capacity   Essential hypertension:  Pressures are labile.  Currently on Coreg , Lasix , losartan , Aldactone .  Intermittent noncompliance  Hyperlipidemia: Continue Crestor .   Diabetes mellitus type 2:  last A1c of 5.4. refuses monitoring    Polysubstance abuse: UDS was positive for cocaine and benzos.   Deconditioning: Ambulatory status complicated by previous right AKA and left BKA has no insurance coverage for SNF. - refusing therapy at times   History of COPD: Continue inhalers   History of DVT: On Xarelto  but frequently refuses   Estimated body mass index is 20.09 kg/m as calculated from the following:   Height as of this encounter: 5' 9 (1.753 m).  Weight as of this encounter: 61.7 kg   Interval History:  No complaints.     Antimicrobials:   DVT  prophylaxis:   rivaroxaban  (XARELTO ) tablet 20 mg   Code Status:   Code Status: Full Code  Mobility Assessment (Last 72 Hours)     Mobility Assessment     Row Name 06/10/24 0800 06/09/24 2200 06/09/24 2100 06/09/24 2000 06/09/24 1200   Does patient have an order for bedrest or is patient medically unstable No - Continue assessment No - Continue assessment No - Continue assessment No - Continue assessment --   What is the highest level of mobility based on the progressive mobility assessment? Level 2 (Chairfast) - Balance while sitting on edge of bed and cannot stand Level 2 (Chairfast) - Balance while sitting on edge of bed and cannot stand Level 2 (Chairfast) - Balance while sitting on edge of bed and cannot stand Level 2 (Chairfast) - Balance while sitting on edge of bed and cannot stand Level 2 (Chairfast) - Balance while sitting on edge of bed and cannot stand   Is the above level different from baseline mobility prior to current illness? Yes - Recommend PT order Yes - Recommend PT order Yes - Recommend PT order Yes - Recommend PT order --    Row Name 06/09/24 0745 06/08/24 2100         Does patient have an order for bedrest or is patient medically unstable No - Continue assessment No - Continue assessment      What is the highest level of mobility based on the progressive mobility assessment? Level 2 (Chairfast) - Balance while sitting on edge of bed and cannot stand Level 2 (Chairfast) - Balance while sitting on edge of bed and cannot stand      Is the above level different from baseline mobility prior to current illness? Yes - Recommend PT order Yes - Recommend PT order         Barriers to discharge: APS to obtain guardianship Disposition Plan:  TBD HH orders placed: n/a Status is: Inpt  Objective: Blood pressure (!) 147/109, pulse 84, temperature 98.1 F (36.7 C), temperature source Oral, resp. rate 18, height 5' 9 (1.753 m), weight 61.7 kg, SpO2 96%.  Examination:   General:  NAD Lungs: Normal resp effort Extremities: Left BKA, Right AKA   Consultants:  Psych      LOS: 99 days   [name], MD [facility] 06/11/2024, 12:12 PM

## 2024-06-11 NOTE — Progress Notes (Addendum)
 Patient refusing telemetry overnight; patient removed telemetry box. Patient refused to keep iv access. Iv cath. Is out of his hand. Patient remains on oxygen  at 2 liters; MD made aware.

## 2024-06-11 NOTE — Progress Notes (Addendum)
 Patient in the bathroom on the toilet having a bm; he called out that he felt short of breath; applied oxygen ; pulse 60 oxygen  100% with O2; all staff called to assist transfer to wheelchair and out of the bathroom back to bed; unable to get BP reading in the bathroom. Once return to bed patient will not speak or follow commands; rapid present and MD at bedisde.  EKG obtained, CBG obtained and continue to monitor vitals; orders received from MD. BP obtained back in bed 104/80 (89).

## 2024-06-11 NOTE — Progress Notes (Signed)
 Called by Nurse patient went to bathroom, call for help saying he was SOB, when they arrive, he reporter SOB, then was unresponsive, his HR was low and BP was low. He was assisted to bed. BP in the 80. Breathing was not laborer oxygen  sat more than 100 %.  On my evaluation, he was alert, he was not answering questions, he was able to squeezed hands, didn't move left arm on command, but when nurse try to suction him he remove her hand. After 250cc IV bolus BP increased to 110, HR in the 60.  A/P:  -Patient likely had vaso-vagal episode.  -Plan to check Labs, CBC , B-met, troponin. EKG sinus rhythm.  -Check Chest x ray, CT head.  -Discussed case with neurology, agrees with CT head. Patient is not candidate for thrombectomy. It might take him more time to return to baseline.  -I will also check ECHO.  -Will placed on telemetry for overnight.

## 2024-06-11 NOTE — Plan of Care (Signed)
  Problem: Nutrition: Goal: Adequate nutrition will be maintained Outcome: Progressing   Problem: Coping: Goal: Level of anxiety will decrease Outcome: Progressing   Problem: Elimination: Goal: Will not experience complications related to bowel motility Outcome: Progressing Goal: Will not experience complications related to urinary retention Outcome: Progressing   Problem: Pain Managment: Goal: General experience of comfort will improve and/or be controlled Outcome: Progressing   Problem: Safety: Goal: Ability to remain free from injury will improve Outcome: Progressing   Problem: Skin Integrity: Goal: Risk for impaired skin integrity will decrease Outcome: Progressing

## 2024-06-11 NOTE — Significant Event (Signed)
 Rapid Response Event Note   Reason for Call :  Unresponsive in bathroom, hypotensive.   Initial Focused Assessment:  Upon my arrival staff has assisted him back to bed.  He is breathing easily, no increased work of breathing. Lung sounds clear. Heart tones regular. He is warm and dry. He has his eyes open and will look around but will not follow commands or engage with staff. He reached up with both arms (right side weak) when mouth suctioned.   After repeat stimulation he did squeeze with both hands.  He has not attempted to speak.  BP 87/72 HR 59   O2 sat 100% on [place]  Dr Madelyne came to bedside  Interventions:  PIV left hand (started NS bolus per orders stopped a few minutes later d/t improved BP)  BP 111/78  HR 63 ABG drawn PCXR done  Lab at bedside to draw labs.  Patient started yelling and telling staff to leave the room.  He is refusing lab draw and CT imaging.   Plan of Care:     Event Summary:   MD Notified: [place] Call Time: 1458 Arrival Time: 1501 End Time: 1615  Elvin [place], RN

## 2024-06-12 ENCOUNTER — Inpatient Hospital Stay (HOSPITAL_COMMUNITY): Payer: MEDICAID

## 2024-06-12 DIAGNOSIS — I639 Cerebral infarction, unspecified: Secondary | ICD-10-CM | POA: Diagnosis not present

## 2024-06-12 NOTE — Plan of Care (Signed)
  Problem: Nutrition: Goal: Adequate nutrition will be maintained Outcome: Progressing   Problem: Coping: Goal: Level of anxiety will decrease Outcome: Progressing   Problem: Elimination: Goal: Will not experience complications related to bowel motility Outcome: Progressing   Problem: Elimination: Goal: Will not experience complications related to bowel motility Outcome: Progressing   Problem: Elimination: Goal: Will not experience complications related to bowel motility Outcome: Progressing Goal: Will not experience complications related to urinary retention Outcome: Progressing   Problem: Pain Managment: Goal: General experience of comfort will improve and/or be controlled Outcome: Progressing   Problem: Safety: Goal: Ability to remain free from injury will improve Outcome: Progressing   Problem: Skin Integrity: Goal: Risk for impaired skin integrity will decrease Outcome: Progressing

## 2024-06-12 NOTE — Progress Notes (Signed)
 Progress Note    [name]   FMW:[id number]  DOB: 1966/07/30  DOA: 03/04/2024     100 PCP: Tanda Bleacher, MD  Initial CC: Slurred speech, confusion  Hospital Course: 58 year old with history significant for COPD, cocaine abuse, presented to the ED 03/04/24 for slower speech, confusion and right-sided weakness, CT head showed left MCA infarct, CTA head and neck revealed an M2 occlusion on the left,   Echocardiogram revealed large mobile left ventricular thrombus on the left and right ventricular apical thrombus in the right, ejection fraction less than 20%.  Cardiology was consulted recommended DOAC.  PT evaluated patient and recommended skilled nursing facility placement.  Patient was found to lack medical decision capacity/status and not amenable to determining his discharge disposition or leave AGAINST MEDICAL ADVICE.   He intermittently declines physical exams and medications. TOC is pursuing guardianship with APS as he is lacking medical decision capacity. Psychiatry presently recommending to await APS decision for guardianship before discharging home.  Patient does not qualify for forced medication administration   7/12: patient had an episode of Syncope, likely Vasovagal, happen while he was in the bathroom trying to have BM. BP was low at time of episode. Received gentle 250cc IV bolus. After 1 patient return to baseline. He decline labs. CT head showed encephalomalacia.   Assessment & Plan:  Principal Problem:   Acute CVA (cerebrovascular accident) (HCC) Active Problems:   LV (left ventricular) mural thrombus   Chronic systolic CHF (congestive heart failure) (HCC)   Prolonged QT interval   Elevated liver enzymes   COPD (chronic obstructive pulmonary disease) (HCC)   Cocaine abuse (HCC)   Schizoaffective disorder, bipolar type (HCC)   Benign essential HTN   Acid reflux   History of DVT (deep vein thrombosis)   Type 2 diabetes mellitus (HCC)   Status post bilateral BKA.    Dilated cardiomyopathy (HCC)    Acute CVA (cerebrovascular accident) (HCC) -He underwent extensive workup and imaging revealed left MCA territory infarct, caudate nucleus, lentiform nucleus and internal capsule MRI confirmed. -TEE was noted for large mobile left lower clot just under the mitral valve with an EF of 20% and a small right apical clot with reduced RV systolic function. -He was started on Eliquis  for intracardiac thrombus, however he remained noncompliant.  In order to improve compliance, medicine changed to Xarelto  for once daily dosing -Psychiatry has deemed the patient to lack capacity; he continues to remain noncompliant at times -Unable to force meds.  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  Eliquis  changed to Xarelto , Coreg  long-acting.   Schizoaffective disorder bipolar disorder lacks medical capacity: Psych was consulted and recommended that the patient lacks medical capacity to make decisions if the patient attempts to leave AGAINST MEDICAL ADVICE he should be involuntarily committed. Psych recommending APS guardianship, TOC in touch with APS regarding this as patient lacks medical capacity   Essential hypertension:  Pressures are labile.  Currently on Coreg , Lasix , losartan , Aldactone .  Intermittent noncompliance Holding meds today due to episode on 7/12.  Hyperlipidemia: Continue Crestor .   Diabetes mellitus type 2:  last A1c of 5.4. refuses monitoring    Polysubstance abuse: UDS was positive for cocaine and benzos.   Deconditioning: Ambulatory status complicated by previous right AKA and left BKA has no insurance coverage for  SNF. - refusing therapy at times   History of COPD: Continue inhalers   History of DVT: On Xarelto  but  frequently refuses   Estimated body mass index is 20.09 kg/m as calculated from the following:   Height as of this encounter: 5' 9 (1.753 m).   Weight as of this encounter: 61.7 kg   Interval History:  He didn't want to talk to me. Didn't want to hear importance of checking labs and repeating ECHO>    Antimicrobials:   DVT prophylaxis:   rivaroxaban  (XARELTO ) tablet 20 mg   Code Status:   Code Status: Full Code  Mobility Assessment (Last 72 Hours)     Mobility Assessment     Row Name 06/11/24 2012 06/10/24 0800 06/09/24 2200 06/09/24 2100 06/09/24 2000   Does patient have an order for bedrest or is patient medically unstable No - Continue assessment No - Continue assessment No - Continue assessment No - Continue assessment No - Continue assessment   What is the highest level of mobility based on the progressive mobility assessment? Level 2 (Chairfast) - Balance while sitting on edge of bed and cannot stand Level 2 (Chairfast) - Balance while sitting on edge of bed and cannot stand Level 2 (Chairfast) - Balance while sitting on edge of bed and cannot stand Level 2 (Chairfast) - Balance while sitting on edge of bed and cannot stand Level 2 (Chairfast) - Balance while sitting on edge of bed and cannot stand   Is the above level different from baseline mobility prior to current illness? No - Consider discontinuing PT/OT Yes - Recommend PT order Yes - Recommend PT order Yes - Recommend PT order Yes - Recommend PT order      Barriers to discharge: APS to obtain guardianship Disposition Plan:  TBD HH orders placed: n/a Status is: Inpt  Objective: Blood pressure 126/83, pulse 64, temperature (!) 97.4 F (36.3 C), temperature source Axillary, resp. rate (!) 21, height 5' 9 (1.753 m), weight 61.7 kg, SpO2 100%.  Examination: decline General: NAD Lungs: Normal Respiratory effort Extremities: Left BKA, Right AKA   Consultants:  Psych      LOS: 100 days   [name],  MD [facility] 06/12/2024, 3:56 PM

## 2024-06-12 NOTE — Progress Notes (Signed)
 Patient refusing patient care. OOB in his wheelchair. No acute distress.

## 2024-06-12 NOTE — Progress Notes (Signed)
 Echocardiogram 2D Echocardiogram attempted. However patient refused exam at 10:30AM  [name] N Sherril [name],RDCS 06/12/2024, 11:31 AM

## 2024-06-12 NOTE — Plan of Care (Signed)
  Problem: Nutrition: Goal: Adequate nutrition will be maintained Outcome: Progressing   Problem: Coping: Goal: Level of anxiety will decrease Outcome: Progressing   Problem: Elimination: Goal: Will not experience complications related to bowel motility Outcome: Progressing Goal: Will not experience complications related to urinary retention Outcome: Progressing   Problem: Pain Managment: Goal: General experience of comfort will improve and/or be controlled Outcome: Progressing   Problem: Safety: Goal: Ability to remain free from injury will improve Outcome: Progressing   Problem: Skin Integrity: Goal: Risk for impaired skin integrity will decrease Outcome: Progressing

## 2024-06-13 DIAGNOSIS — I639 Cerebral infarction, unspecified: Secondary | ICD-10-CM | POA: Diagnosis not present

## 2024-06-13 NOTE — Plan of Care (Signed)
 Pt in a good mood today, has been compliant with meds.  Problem: Elimination: Goal: Will not experience complications related to bowel motility Outcome: Progressing   Problem: Pain Managment: Goal: General experience of comfort will improve and/or be controlled Outcome: Progressing   Problem: Safety: Goal: Ability to remain free from injury will improve Outcome: Progressing

## 2024-06-13 NOTE — Progress Notes (Signed)
 Progress Note   Patient: [name] FMW:[id number] DOB: 11/28/1966 DOA: 03/04/2024     101 DOS: the patient was seen and examined on 06/13/2024   Brief hospital course: 58 year old with history significant for COPD, cocaine abuse, presented to the ED 03/04/24 for slower speech, confusion and right-sided weakness, CT head showed left MCA infarct, CTA head and neck revealed an M2 occlusion on the left,   Echocardiogram revealed large mobile left ventricular thrombus on the left and right ventricular apical thrombus in the right, ejection fraction less than 20%.  Cardiology was consulted recommended DOAC.  PT evaluated patient and recommended skilled nursing facility placement.  Patient was found to lack medical decision capacity/status and not amenable to determining his discharge disposition or leave AGAINST MEDICAL ADVICE.  He intermittently declines physical exams and medications. TOC is pursuing guardianship with APS as he is lacking medical decision capacity. Psychiatry presently recommending to await APS decision for guardianship before discharging home.  Patient does not qualify for forced medication administration  Assessment and Plan:  Acute ischemic CVA: -He underwent extensive workup and imaging revealed left MCA territory infarct, caudate nucleus, lentiform nucleus and internal capsule MRI confirmed. -TEE was noted for large mobile left lower clot just under the mitral valve with an EF of 20% and a small right apical clot with reduced RV systolic function. -He was started on Eliquis  for intracardiac thrombus, however he remained noncompliant.  In order to improve compliance, medicine changed to Xarelto  for once daily dosing -Psychiatry has deemed the patient to lack capacity; he continues to remain noncompliant at times -Unable to force meds.  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  Eliquis  changed to Xarelto , Coreg  long-acting.   Schizoaffective disorder bipolar disorder lacks medical capacity: Psych was consulted and recommended that the patient lacks medical capacity to make decisions if the patient attempts to leave AGAINST MEDICAL ADVICE he should be involuntarily committed. Psych recommending APS guardianship, TOC in touch with APS regarding this as patient lacks medical capacity   Essential hypertension:  on Coreg , Lasix , losartan , Aldactone .  Intermittent noncompliance   Hyperlipidemia: Continue Crestor .   Diabetes mellitus type 2:  last A1c of 5.4. refuses monitoring    Polysubstance abuse: UDS was positive for cocaine and benzos.   Physical Deconditioning: Ambulatory status complicated by previous right AKA and left BKA. - refusing therapy at times   History of COPD: Continue inhalers   History of DVT: On Xarelto  but frequently refuses     Subjective: No acute issues overnight. On wheel chair in the hallways.  Physical Exam: Vitals:   06/11/24 2012 06/12/24 1937 06/12/24 2331 06/13/24 1151  BP: 126/83 (!) 141/94 (!) 143/84 136/83  Pulse: 64 74 72 87  Resp: (!) 21 20 18 18   Temp:  98 F (36.7 C)    TempSrc:  Oral    SpO2: 100% 100% 95% 96%  Weight:      Height:       Constitutional: NAD, calm, comfortable Eyes: PERRL, lids and conjunctivae normal ENMT: Mucous membranes are moist. Posterior pharynx clear of any exudate or lesions.Normal dentition.  Neck: normal, supple, no masses, no thyromegaly Respiratory: clear to auscultation bilaterally, no wheezing, no crackles. Normal respiratory effort. No accessory muscle use.  Cardiovascular: Regular rate and rhythm, no murmurs / rubs /  gallops. No extremity edema. 2+ pedal pulses. No carotid bruits.  Abdomen: no tenderness, no masses palpated. No hepatosplenomegaly.  Bowel sounds positive.  Musculoskeletal: no clubbing / cyanosis. Skin: no rashes, lesions, ulcers. No induration Neurologic: CN 2-12 grossly intact. S/p b/l LE Amputee  Data Reviewed:  There are no new results to review at this time.  Family Communication: None at bedside  Disposition: Status is: Inpatient Remains inpatient appropriate because: pending SNF  Planned Discharge Destination: Skilled nursing facility    Time spent: 39 minutes  Author: Deliliah Room, MD 06/13/2024 12:19 PM  For on call review [email or web address].

## 2024-06-14 DIAGNOSIS — I639 Cerebral infarction, unspecified: Secondary | ICD-10-CM | POA: Diagnosis not present

## 2024-06-14 NOTE — Progress Notes (Signed)
 Chaplain followed up with [name]. He shared his emotions about his upcoming court hearing and housing situation. He is cautiously optimistic at this time, stating we'll see what happens, and that ultimately it'll be alright. He requested a brief prayer, which chaplain happily provided, including in the prayer that he and the medical team can continue to work together peacefully. We also explored some of his life history and overall outlook on life. Chaplain will continue to follow.

## 2024-06-14 NOTE — Progress Notes (Signed)
 Progress Note   Patient: [name] FMW:[id number] DOB: [birth year] DOA: 03/04/2024     102 DOS: the patient was seen and examined on 06/14/2024   Brief hospital course: 58 year old with history significant for COPD, cocaine abuse, presented to the ED 03/04/24 for slower speech, confusion and right-sided weakness, CT head showed left MCA infarct, CTA head and neck revealed an M2 occlusion on the left,   Echocardiogram revealed large mobile left ventricular thrombus on the left and right ventricular apical thrombus in the right, ejection fraction less than 20%.  Cardiology was consulted recommended DOAC.  PT evaluated patient and recommended skilled nursing facility placement.  Patient was found to lack medical decision capacity/status and not amenable to determining his discharge disposition or leave AGAINST MEDICAL ADVICE.  He intermittently declines physical exams and medications. TOC is pursuing guardianship with APS as he is lacking medical decision capacity. Psychiatry presently recommending to await APS decision for guardianship before discharging home.   Assessment and Plan:  Acute ischemic CVA: -He underwent extensive workup and imaging revealed left MCA territory infarct, caudate nucleus, lentiform nucleus and internal capsule MRI confirmed. -TEE was noted for large mobile left lower clot just under the mitral valve with an EF of 20% and a small right apical clot with reduced RV systolic function. -He was started on Eliquis  for intracardiac thrombus, however he remained noncompliant.  In order to improve compliance, medicine changed to Xarelto  for once daily dosing -Psychiatry has deemed the patient to lack capacity; he continues to remain noncompliant at times -Unable to force meds.  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  Eliquis  changed to Xarelto , Coreg  long-acting.   Schizoaffective disorder bipolar disorder lacks medical capacity: Psych was consulted and recommended that the patient lacks medical capacity to make decisions if the patient attempts to leave AGAINST MEDICAL ADVICE he should be involuntarily committed. Psych recommending APS guardianship, TOC in touch with APS regarding this as patient lacks medical capacity   Essential hypertension:  on Coreg , Lasix , losartan , Aldactone .  Intermittent noncompliance   Hyperlipidemia: Continue Crestor .   Diabetes mellitus type 2:  last A1c of 5.4. refuses monitoring    Polysubstance abuse: UDS was positive for cocaine and benzos.   Physical Deconditioning: Ambulatory status complicated by previous right AKA and left BKA. - refusing therapy at times   History of COPD: Continue inhalers   History of DVT: On Xarelto  but frequently refuses     Subjective: No acute issues overnight. Laying in the bed. Has been compliant with his meds since yesterday.  Physical Exam: Vitals:   06/13/24 2017 06/13/24 2338 06/14/24 0324 06/14/24 0822  BP: 137/82 131/89 (!) 128/90   Pulse: 71 67 65 66  Resp: 19 18 18 18   Temp:  98.2 F (36.8 C) 98 F (36.7 C)   TempSrc:  Axillary    SpO2: 99% 99% 98% 97%  Weight:      Height:       Constitutional: NAD, calm, comfortable Eyes: PERRL, lids and conjunctivae normal ENMT: Mucous membranes are moist. Posterior pharynx clear of any exudate or lesions.Normal dentition.  Neck: normal, supple, no masses, no thyromegaly Respiratory: clear to auscultation bilaterally, no wheezing, no crackles. Normal respiratory effort. No accessory muscle use.  Cardiovascular: Regular rate and rhythm, no murmurs / rubs /  gallops. No extremity edema. 2+ pedal pulses. No carotid bruits.  Abdomen: no tenderness, no masses palpated. No hepatosplenomegaly. Bowel sounds positive.   Musculoskeletal: no clubbing / cyanosis. Skin: no rashes, lesions, ulcers. No induration Neurologic: CN 2-12 grossly intact. S/p b/l LE Amputee  Data Reviewed:  There are no new results to review at this time.  Family Communication: None at bedside  Disposition: Status is: Inpatient Remains inpatient appropriate because: pending SNF  Planned Discharge Destination: Skilled nursing facility    Time spent: 39 minutes  Author: Deliliah Room, MD 06/14/2024 10:16 AM  For on call review [email or web address].

## 2024-06-14 NOTE — TOC Progression Note (Signed)
 Transition of Care [facility]) - Progression Note    Patient Details  Name: [name] MRN: [id number] Date of Birth: 21-Jan-1966  Transition of [facility]) CM/SW Contact  [name], [name] Phone Number: 06/14/2024, 11:19 AM  Clinical Narrative:   CSW participated in care meeting about patient's housing options with [name] and [name]. Multiple providers on the call that have worked with [name] for some time prior to hospitalization, discussed barriers related to housing. No decisions can be made at this time, pending the court hearing to determine competency. Should the patient be deemed competent, the only option that [name] could assist him with would be a boarding house, and he would have to manage himself. If he is deemed to not have capacity, then [facility] can assist with changing his Medicaid over for facility placement. [name] wants to fire his team with [name], but [name] requires that patient has an active ACTT team in order to continue with their services, so they will resume care for now until the court date. CSW to continue to follow.    Expected Discharge Plan: Skilled Nursing Facility Barriers to Discharge: English as a second language teacher, Active Substance Use - Placement, SNF Pending bed offer  Expected Discharge Plan and Services       Living arrangements for the past 2 months: Apartment Expected Discharge Date: 03/14/24                                     Social Determinants of Health (SDOH) Interventions SDOH Screenings   Food Insecurity: Food Insecurity Present (03/06/2024)  Housing: High Risk (03/06/2024)  Transportation Needs: No Transportation Needs (03/06/2024)  Utilities: Patient Declined (03/06/2024)  Alcohol Screen: Medium Risk (06/24/2019)  Depression (PHQ2-9): Low Risk  (02/18/2024)  Recent Concern: Depression (PHQ2-9) - Medium Risk (01/21/2024)  Financial Resource Strain: High Risk (07/24/2021)   Received from [facility]   Physical Activity: Inactive (02/18/2024)  Social Connections: Unknown (04/05/2022)   Received from [facility]  Stress: Stress Concern Present (07/24/2021)   Received from [facility]  Tobacco Use: High Risk (03/04/2024)  Health Literacy: Adequate Health Literacy (02/18/2024)    Readmission Risk Interventions    12/21/2023    9:25 AM 12/09/2022    9:02 AM 12/01/2022    3:56 PM  Readmission Risk Prevention Plan  Transportation Screening Complete Complete Complete  HRI or Home Care Consult Complete    Social Work Consult for Recovery Care Planning/Counseling Complete    Palliative Care Screening Not Applicable    Medication Review Oceanographer) Complete Complete Complete  PCP or Specialist appointment within 3-5 days of discharge  Complete Complete  HRI or Home Care Consult  Complete Complete  SW Recovery Care/Counseling Consult  Complete Complete  Palliative Care Screening  Not Applicable Not Applicable  Skilled Nursing Facility  Not Applicable Not Applicable  SNF Comments   n/a

## 2024-06-14 NOTE — Progress Notes (Signed)
 Physical Therapy Treatment Patient Details Name: Loden Laurent MRN: [id number] DOB: 1966-10-25 Today's Date: 06/14/2024   History of Present Illness Patient is 58 y.o. male presented to ED on 03/04/24 with stroke like symptoms when caregiver found him with slurred speech and right sided weakness. Head CT reveals Acute left MCA territory infarct affecting the insula, caudate nucleus, lentiform nucleus and internal capsule. PMH significant of  Schizoaffective disorder, heart failure with reduced ejection fraction, pretension, DMII, COPD, s/p bilateral amputations, chronic pain, and polysubstance abuse.    PT Comments  Continuing work on functional mobility and activity tolerance;  Al briefly discussed his concerns re: upcoming hearing, housing options; He is expecting some mail, and earlier asked this PT to possibly go the mail room to check and see if any mail arrived for him (I stopped by there before session, no mail for [place] today);   Turned the conversation to physical tasks to work on with PT -- he has made good progress, and is meeting functional mobility goals; would need to DC PT, unless he chooses to push forward with prosthesis training -- we can continue working towards more steady transfers, stretching and strengthening for walking with prostheses (tells me he has been casted for RLE prosthesis); After consideration, Arul agreed to begin working with L prosthesis; We decided today would be enough to check prosthesis fit;   His L prosthesis was able to be locked onto the liner with minimal effort; I anticipate he could use a few sock plys to spread weight and pressure at end of his residual limb, and decr likelihood of concentrated areas of pressure;   Will plan to contact [name], [facility] tomorrow;  Goals updated   If plan is discharge home, recommend the following: Supervision due to cognitive status;Direct supervision/assist for financial management;Direct supervision/assist for  medications management;Assist for transportation   Can travel by private vehicle      (perhaps soon)  Equipment Recommendations  None recommended by PT    Recommendations for Other Services OT consult;Speech consult     Precautions / Restrictions Precautions Precautions: Fall Recall of Precautions/Restrictions: Impaired Precaution/Restrictions Comments: h/o BLE amputations Required Braces or Orthoses: Other Brace Other Brace: More open to working on L prosthesis use Restrictions Weight Bearing Restrictions Per Provider Order: No            Communication Communication Communication: Other (comment) (Improving)  Cognition Arousal: Alert Behavior During Therapy: WFL for tasks assessed/performed                           PT - Cognition Comments: More open to considering working on prosthesis use Following commands: Intact      Cueing Cueing Techniques: Verbal cues, Visual cues (Demonstrational cues)  Exercises      General Comments General comments (skin integrity, edema, etc.): Assisted Malosi in a call to see about getting the legrests for his personal wheelchair delivered to the hospital      Pertinent Vitals/Pain Pain Assessment Pain Assessment: Faces Faces Pain Scale: No hurt Pain Intervention(s): Monitored during session    Home Living                          Prior Function            PT Goals (current goals can now be found in the care plan section) Acute Rehab PT Goals Patient Stated Goal: Tells me he is expecting mail correspondence PT  Goal Formulation: With patient Time For Goal Achievement: 07/12/24 Potential to Achieve Goals: Fair Additional Goals Additional Goal #1: Pt will perfrom a floor to wheelchair transfer with min assist with or without prosthesis Additional Goal #2: Pt will perform modified standing with L prosthesis on (foot on ground) on and R LE on padded, elevated surface (bed surface, recliner seat) with and  without UE support Progress towards PT goals: Progressing toward goals;Goals updated    Frequency    Min 2X/week      PT Plan      Co-evaluation              AM-PAC PT 6 Clicks Mobility   Outcome Measure  Help needed turning from your back to your side while in a flat bed without using bedrails?: None Help needed moving from lying on your back to sitting on the side of a flat bed without using bedrails?: None Help needed moving to and from a bed to a chair (including a wheelchair)?: A Little Help needed standing up from a chair using your arms (e.g., wheelchair or bedside chair)?: Total Help needed to walk in hospital room?: Total Help needed climbing 3-5 steps with a railing? : Total 6 Click Score: 14    End of Session   Activity Tolerance: Patient tolerated treatment well Patient left: with call bell/phone within reach (managing independently in room) Nurse Communication: Mobility status PT Visit Diagnosis: Other abnormalities of gait and mobility (R26.89);Muscle weakness (generalized) (M62.81);Other symptoms and signs involving the nervous system (R29.898);Hemiplegia and hemiparesis Hemiplegia - Right/Left: Right Hemiplegia - dominant/non-dominant: Dominant Hemiplegia - caused by: Cerebral infarction     Time: 1330-1400 PT Time Calculation (min) (ACUTE ONLY): 30 min  Charges:    $Therapeutic Activity: 23-37 mins PT General Charges $$ ACUTE PT VISIT: 1 Visit                     [name], PT  Acute Rehabilitation Services Office [phone] Secure Chat welcomed    [name] 06/14/2024, 5:29 PM

## 2024-06-15 DIAGNOSIS — I639 Cerebral infarction, unspecified: Secondary | ICD-10-CM | POA: Diagnosis not present

## 2024-06-15 NOTE — Plan of Care (Signed)
   Problem: Nutrition: Goal: Adequate nutrition will be maintained Outcome: Progressing   Problem: Pain Managment: Goal: General experience of comfort will improve and/or be controlled Outcome: Progressing   Problem: Safety: Goal: Ability to remain free from injury will improve Outcome: Progressing

## 2024-06-15 NOTE — Progress Notes (Signed)
 Patient refusing medications and vital signs, patient said forget it and left room via wheelchair.

## 2024-06-15 NOTE — Plan of Care (Signed)
 Had to discuss at length the fact that we cannot give him meds, especially BP meds until we know what his BP is and also, I need to do an NIHSS on him so I can tell what is going on with him and to be able to assess for changes and be able to inform the MD if there are any changes.  At first he was non-compliant and refusing then he gave in and allowed me to perform an NIHSS and give meds.  He did cooperate today but does say if he says No then NO MEANS NO  Problem: Nutrition: Goal: Adequate nutrition will be maintained Outcome: Progressing   Problem: Coping: Goal: Level of anxiety will decrease Outcome: Progressing   Problem: Pain Managment: Goal: General experience of comfort will improve and/or be controlled Outcome: Progressing   Problem: Safety: Goal: Ability to remain free from injury will improve Outcome: Progressing

## 2024-06-15 NOTE — Progress Notes (Signed)
 Progress Note   Patient: [name] FMW:[id number] DOB: September 02, 1966 DOA: 03/04/2024     103 DOS: the patient was seen and examined on 06/15/2024   Brief hospital course: 58 year old with history significant for COPD, cocaine abuse, presented to the ED 03/04/24 for slower speech, confusion and right-sided weakness, CT head showed left MCA infarct, CTA head and neck revealed an M2 occlusion on the left,   Echocardiogram revealed large mobile left ventricular thrombus on the left and right ventricular apical thrombus in the right, ejection fraction less than 20%.  Cardiology was consulted recommended DOAC.  PT evaluated patient and recommended skilled nursing facility placement.  Patient was found to lack medical decision capacity/status and not amenable to determining his discharge disposition or leave AGAINST MEDICAL ADVICE.  He intermittently declines physical exams and medications. TOC is pursuing guardianship with APS as he is lacking medical decision capacity. Psychiatry presently recommending to await APS decision for guardianship before discharging home.   Assessment and Plan:  Acute ischemic CVA: -He underwent extensive workup and imaging revealed left MCA territory infarct, caudate nucleus, lentiform nucleus and internal capsule MRI confirmed. -TEE was noted for large mobile left lower clot just under the mitral valve with an EF of 20% and a small right apical clot with reduced RV systolic function. -He was started on Eliquis  for intracardiac thrombus, however he remained noncompliant.  In order to improve compliance, medicine changed to Xarelto  for once daily dosing -Psychiatry has deemed the patient to lack capacity; he continues to remain noncompliant at times -Unable to force meds.  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  Eliquis  changed to Xarelto , Coreg  long-acting.   Schizoaffective disorder bipolar disorder lacks medical capacity: Psych was consulted and recommended that the patient lacks medical capacity to make decisions if the patient attempts to leave AGAINST MEDICAL ADVICE he should be involuntarily committed. Psych recommending APS guardianship, TOC in touch with APS regarding this as patient lacks medical capacity   Essential hypertension:  on Coreg , Lasix , losartan , Aldactone .  Intermittent noncompliance   Hyperlipidemia: Continue Crestor .   Diabetes mellitus type 2:  last A1c of 5.4. refuses monitoring    Polysubstance abuse: UDS was positive for cocaine and benzos.   Physical Deconditioning: Ambulatory status complicated by previous right AKA and left BKA. - refusing therapy at times   History of COPD: Continue inhalers   History of DVT: On Xarelto  but frequently refuses     Subjective: No acute issues overnight. Non-compliant with meds this morning.  Physical Exam: Vitals:   06/13/24 2338 06/14/24 0324 06/14/24 0822 06/14/24 2029  BP:  (!) 128/90  135/86  Pulse:  65 66 72  Resp: 18 18 18 18   Temp:  98 F (36.7 C)    TempSrc: Axillary     SpO2: 99% 98% 97% 97%  Weight:      Height:       Constitutional: NAD, calm, comfortable Eyes: PERRL, lids and conjunctivae normal ENMT: Mucous membranes are moist. Posterior pharynx clear of any exudate or lesions.Normal dentition.  Neck: normal, supple, no masses, no thyromegaly Respiratory: clear to auscultation bilaterally, no wheezing, no crackles. Normal respiratory effort. No accessory muscle use.  Cardiovascular: Regular rate and rhythm, no murmurs / rubs / gallops. No extremity edema. 2+ pedal pulses. No carotid bruits.  Abdomen: no tenderness, no masses palpated. No hepatosplenomegaly. Bowel sounds positive.  Musculoskeletal: no clubbing / cyanosis. Skin: no  rashes, lesions, ulcers. No induration Neurologic: CN 2-12 grossly intact. S/p b/l LE Amputee  Data Reviewed:  There are no new results to review at this time.  Family Communication: None at bedside  Disposition: Status is: Inpatient Remains inpatient appropriate because: pending SNF  Planned Discharge Destination: Skilled nursing facility    Time spent: 37 minutes  Author: Deliliah Room, MD 06/15/2024 8:57 AM  For on call review [email or web address].

## 2024-06-15 NOTE — TOC Progression Note (Signed)
 Transition of Care [facility]) - Progression Note    Patient Details  Name: [name] MRN: [id number] Date of Birth: 05/29/1966  Transition of Care [facility]) CM/SW Contact  Luise JAYSON Pan, [place] Phone Number: 06/15/2024, 2:59 PM  Clinical Narrative:   CSW will continue to follow. Barriers to discharge: pending court date to determine if patient has capacity. Please see note from 7/14 for more details.   TOC will continue to follow.   Expected Discharge Plan: Skilled Nursing Facility Barriers to Discharge: English as a second language teacher, Active Substance Use - Placement, SNF Pending bed offer  Expected Discharge Plan and Services       Living arrangements for the past 2 months: Apartment Expected Discharge Date: 03/14/24                                     Social Determinants of Health (SDOH) Interventions SDOH Screenings   Food Insecurity: Food Insecurity Present (03/06/2024)  Housing: High Risk (03/06/2024)  Transportation Needs: No Transportation Needs (03/06/2024)  Utilities: Patient Declined (03/06/2024)  Alcohol Screen: Medium Risk (06/24/2019)  Depression (PHQ2-9): Low Risk  (02/18/2024)  Recent Concern: Depression (PHQ2-9) - Medium Risk (01/21/2024)  Financial Resource Strain: High Risk (07/24/2021)   Received from [facility]  Physical Activity: Inactive (02/18/2024)  Social Connections: Unknown (04/05/2022)   Received from [facility]  Stress: Stress Concern Present (07/24/2021)   Received from [facility]  Tobacco Use: High Risk (03/04/2024)  Health Literacy: Adequate Health Literacy (02/18/2024)    Readmission Risk Interventions    12/21/2023    9:25 AM 12/09/2022    9:02 AM 12/01/2022    3:56 PM  Readmission Risk Prevention Plan  Transportation Screening Complete Complete Complete  HRI or Home Care Consult Complete    Social Work Consult for Recovery Care Planning/Counseling Complete    Palliative Care Screening Not Applicable    Medication Review Oceanographer)  Complete Complete Complete  PCP or Specialist appointment within 3-5 days of discharge  Complete Complete  HRI or Home Care Consult  Complete Complete  SW Recovery Care/Counseling Consult  Complete Complete  Palliative Care Screening  Not Applicable Not Applicable  Skilled Nursing Facility  Not Applicable Not Applicable  SNF Comments   n/a

## 2024-06-15 NOTE — Progress Notes (Signed)
 Physical Therapy Treatment Patient Details Name: [name] MRN: [id number] DOB: 05-16-1966 Today's Date: 06/15/2024   History of Present Illness Patient is 58 y.o. male presented to ED on 03/04/24 with stroke like symptoms when caregiver found him with slurred speech and right sided weakness. Head CT reveals Acute left MCA territory infarct affecting the insula, caudate nucleus, lentiform nucleus and internal capsule. PMH significant of  Schizoaffective disorder, heart failure with reduced ejection fraction, pretension, DMII, COPD, s/p bilateral amputations, chronic pain, and polysubstance abuse.    PT Comments  Continuing work on functional mobility and activity tolerance;  [name] was engaged in session, and agreeing to work with his prosthesis; Session focused on initial standing work with L prosthesis foot on the floor, and R LE bolstered on seat of recliner chair; Facing the recliner chair, he pulled up to semi-stand, supporting himself on the sturdy chair back with his forearms; hip were flexed to approx 45 degrees -- will continue to work towards fully upright standing;   Second part of session was spent rolling ot a new part of the hospital -- the mailroom; Needed lots of cues for pathfinding back, and I anticipate pathfinding back to the mail room (he'd like to visit again on Thursday at noon) will be a difficult challenge for [name]   If plan is discharge home, recommend the following: Supervision due to cognitive status;Direct supervision/assist for financial management;Direct supervision/assist for medications management;Assist for transportation   Can travel by private vehicle      (perhaps soon)  Equipment Recommendations  None recommended by PT    Recommendations for Other Services OT consult;Speech consult     Precautions / Restrictions Precautions Precautions: Fall Recall of Precautions/Restrictions: Impaired Precaution/Restrictions Comments: h/o BLE amputations Required  Braces or Orthoses: Other Brace Other Brace: More open to working on L prosthesis use Restrictions Weight Bearing Restrictions Per Provider Order: No     Mobility  Bed Mobility               General bed mobility comments: Pt in w/c on arrival    Transfers Overall transfer level: Needs assistance Equipment used:  (Used recliner seat as a bolster for R LE) Transfers: Sit to/from Stand Sit to Stand: Engineer, manufacturing systems transfers: Supervision   General transfer comment: first performed small ant-post to get slightly onto recliner seat facing the front of the chair; once positioned with L foot on the floor to the left of the recliner seat (armrest pushed back), [name] pulled himself into semi stand with prosthesis foot on the floor and R residual limb (end of R femur) supported on recliner seat; UEs supported with forearms on teh top of the recliner chair back; stood with hips felxed to about 45 degrees    Ambulation/Gait                   Psychologist, counselling mobility: Yes Wheelchair propulsion: Both upper extremities Wheelchair parts: Independent Distance: greater than 500 feet Wheelchair Assistance Details (indicate cue type and reason): Rolled to mailroom on ground floor, West side of building; noted [name] was able to speed up when trying to catch elevator   Tilt Bed    Modified Rankin (Stroke Patients Only)       Balance  Communication Communication Communication: Other (comment) (Improving)  Cognition Arousal: Alert Behavior During Therapy: WFL for tasks assessed/performed                             Following commands: Intact Following commands impaired: Follows one step commands inconsistently, Follows one step commands with increased time    Cueing Cueing Techniques: Verbal cues, Visual cues  (Demonstrational cues)  Exercises      General Comments General comments (skin integrity, edema, etc.): [name] donned his prosthesis liner without assist, took care to get a good fit, and re-did when liner was on backwards; assist to get prosthesis to fit on liner      Pertinent Vitals/Pain Pain Assessment Pain Assessment: Faces Faces Pain Scale: Hurts a little bit Pain Location: grimace may be related to effort Pain Descriptors / Indicators: Grimacing Pain Intervention(s): Monitored during session    Home Living                          Prior Function            PT Goals (current goals can now be found in the care plan section) Acute Rehab PT Goals Patient Stated Goal: Tells me he is expecting mail correspondence PT Goal Formulation: With patient Time For Goal Achievement: 07/12/24 Potential to Achieve Goals: Fair Progress towards PT goals: Progressing toward goals    Frequency    Min 2X/week      PT Plan      Co-evaluation              AM-PAC PT 6 Clicks Mobility   Outcome Measure  Help needed turning from your back to your side while in a flat bed without using bedrails?: None Help needed moving from lying on your back to sitting on the side of a flat bed without using bedrails?: None Help needed moving to and from a bed to a chair (including a wheelchair)?: A Little Help needed standing up from a chair using your arms (e.g., wheelchair or bedside chair)?: Total Help needed to walk in hospital room?: Total Help needed climbing 3-5 steps with a railing? : Total 6 Click Score: 14    End of Session   Activity Tolerance: Patient tolerated treatment well Patient left: Other (comment) (rolling with wheelchair in hallway) Nurse Communication: Mobility status PT Visit Diagnosis: Other abnormalities of gait and mobility (R26.89);Muscle weakness (generalized) (M62.81);Other symptoms and signs involving the nervous system (R29.898);Hemiplegia and  hemiparesis Hemiplegia - Right/Left: Right Hemiplegia - dominant/non-dominant: Dominant Hemiplegia - caused by: Cerebral infarction     Time: 1208-1256 PT Time Calculation (min) (ACUTE ONLY): 48 min  Charges:    $Therapeutic Activity: 38-52 mins PT General Charges $$ ACUTE PT VISIT: 1 Visit                     [name], PT  Acute Rehabilitation Services Office [phone] Secure Chat welcomed    [name] VEAR [name] 06/15/2024, 4:32 PM

## 2024-06-16 DIAGNOSIS — I639 Cerebral infarction, unspecified: Secondary | ICD-10-CM | POA: Diagnosis not present

## 2024-06-16 NOTE — Progress Notes (Signed)
 Occupational Therapy Treatment Patient Details Name: [name] MRN: [id number] DOB: [birth year] Today's Date: 06/16/2024   History of present illness Patient is 58 y.o. male presented to ED on 03/04/24 with stroke like symptoms when caregiver found him with slurred speech and right sided weakness. Head CT reveals Acute left MCA territory infarct affecting the insula, caudate nucleus, lentiform nucleus and internal capsule. PMH significant of  Schizoaffective disorder, heart failure with reduced ejection fraction, pretension, DMII, COPD, s/p bilateral amputations, chronic pain, and polysubstance abuse.   OT comments  Patient continues to make good gain with OT treatment. Patient was CGA to transfer to tub bench from wheelchair for safety with lateral scoot transfer. Patient able to bathe UB/LB while seated on tub bench with supervision. Patient returned to wheelchair and able to perform UB/LB dressing with setup and supervision. Patient performed wheelchair mobility off unit with patient improving with path finding. Acute OT to continue to follow to address established goals to facilitate DC to next venue of care.        If plan is discharge home, recommend the following:  A little help with walking and/or transfers;A little help with bathing/dressing/bathroom;Supervision due to cognitive status;Direct supervision/assist for financial management;Direct supervision/assist for medications management   Equipment Recommendations  None recommended by OT    Recommendations for Other Services      Precautions / Restrictions Precautions Precautions: Fall Recall of Precautions/Restrictions: Impaired Precaution/Restrictions Comments: h/o BLE amputations Required Braces or Orthoses: Other Brace Other Brace: More open to working on L prosthesis use Restrictions Weight Bearing Restrictions Per Provider Order: No       Mobility Bed Mobility Overal bed mobility: Modified Independent              General bed mobility comments: Pt in w/c on arrival    Transfers Overall transfer level: Needs assistance Equipment used: None Transfers: Bed to chair/wheelchair/BSC            Lateral/Scoot Transfers: Contact guard assist General transfer comment: lateral scoot transfers to/from tub bench from/to wheelchair     Balance Overall balance assessment: Mild deficits observed, not formally tested Sitting-balance support: Bilateral upper extremity supported Sitting balance-Leahy Scale: Good Sitting balance - Comments: able to maintain sitting balance on tub bench                                   ADL either performed or assessed with clinical judgement   ADL Overall ADL's : Needs assistance/impaired     Grooming: Wash/dry hands;Independent;Sitting Grooming Details (indicate cue type and reason): chair level at sink Upper Body Bathing: Supervision/ safety;Sitting Upper Body Bathing Details (indicate cue type and reason): in shower Lower Body Bathing: Supervison/ safety;Sitting/lateral leans Lower Body Bathing Details (indicate cue type and reason): in shower Upper Body Dressing : Set up;Sitting Upper Body Dressing Details (indicate cue type and reason): donned T-shirt Lower Body Dressing: Supervision/safety;Sitting/lateral leans Lower Body Dressing Details (indicate cue type and reason): donned shorts while seated in wheelchair with lateral leaning         Tub/ Shower Transfer: Contact guard assist;Tub bench Tub/Shower Transfer Details (indicate cue type and reason): lateral scoot transfer to/from tub bench from/to wheelchair   General ADL Comments: demonstrating gains with dressing    Extremity/Trunk Assessment              Vision       Perception     Praxis  Communication Communication Communication: No apparent difficulties (word finding difficulties) Factors Affecting Communication: Difficulty expressing self;Reduced clarity of speech    Cognition Arousal: Alert Behavior During Therapy: WFL for tasks assessed/performed Cognition: Cognition impaired Difficult to assess due to: Impaired communication   Awareness: Intellectual awareness intact, Online awareness impaired Memory impairment (select all impairments): Working memory Attention impairment (select first level of impairment): Selective attention   OT - Cognition Comments: improvement with pathfinding from wheelchair level                 Following commands: Intact Following commands impaired: Follows one step commands inconsistently, Follows one step commands with increased time      Cueing   Cueing Techniques: Verbal cues, Visual cues  Exercises Other Exercises Other Exercises: wheelchair mobility performed in halls    Shoulder Instructions       General Comments wheelchair mobility performed with patient demonstrating improvement with pathfinding    Pertinent Vitals/ Pain       Pain Assessment Pain Assessment: No/denies pain Pain Intervention(s): Monitored during session  Home Living                                          Prior Functioning/Environment              Frequency  Min 1X/week        Progress Toward Goals  OT Goals(current goals can now be found in the care plan section)  Progress towards OT goals: Progressing toward goals  Acute Rehab OT Goals Patient Stated Goal: to increase independence OT Goal Formulation: With patient Time For Goal Achievement: 06/25/24 Potential to Achieve Goals: Fair ADL Goals Pt Will Perform Lower Body Bathing: with mod assist;sitting/lateral leans Pt Will Perform Lower Body Dressing: with modified independence;bed level Pt Will Transfer to Toilet: with mod assist;with transfer board;bedside commode Pt Will Perform Toileting - Clothing Manipulation and hygiene: with mod assist;sitting/lateral leans Pt/caregiver will Perform Home Exercise Program: Right Upper  extremity;Independently;With written HEP provided (to increase coordination with use of RUE) Additional ADL Goal #1: Pt will transfer to w/c with Mod I as a precursor for OOB ADLs Additional ADL Goal #2: Pt will utilize RUE to gather 5/5 objects without overshooting. Additional ADL Goal #3: Pt will demonstrate proper safety with transfers/mobility 100% of the time during session.  Plan      Co-evaluation                 AM-PAC OT 6 Clicks Daily Activity     Outcome Measure   Help from another person eating meals?: None Help from another person taking care of personal grooming?: A Little Help from another person toileting, which includes using toliet, bedpan, or urinal?: A Lot Help from another person bathing (including washing, rinsing, drying)?: A Little Help from another person to put on and taking off regular upper body clothing?: A Little Help from another person to put on and taking off regular lower body clothing?: A Little 6 Click Score: 18    End of Session Equipment Utilized During Treatment: Other (comment) (wheelchair, tub bench)  OT Visit Diagnosis: Unsteadiness on feet (R26.81);Other abnormalities of gait and mobility (R26.89);Muscle weakness (generalized) (M62.81);Other symptoms and signs involving the nervous system (R29.898);Other symptoms and signs involving cognitive function;Hemiplegia and hemiparesis Hemiplegia - Right/Left: Right Hemiplegia - dominant/non-dominant: Dominant Hemiplegia - caused by: Cerebral infarction   Activity Tolerance  Patient tolerated treatment well   Patient Left in chair;with call bell/phone within reach   Nurse Communication Mobility status        Time: 9068-8992 OT Time Calculation (min): 36 min  Charges: OT General Charges $OT Visit: 1 Visit OT Treatments $Self Care/Home Management : 23-37 mins  [name], OTA Acute Rehabilitation Services  Office [phone]   [name] 06/16/2024, 2:10 PM

## 2024-06-16 NOTE — Progress Notes (Signed)
 SLP Cancellation Note  Patient Details Name: [name] MRN: [id number] DOB: 14-Jun-1966   Cancelled treatment:       Reason Eval/Treat Not Completed: Other (comment) Pt not in his room. Will f/u as able.     Leita SAILOR., M.A. CCC-SLP Acute Rehabilitation Services Office: [phone]  Secure chat preferred  06/16/2024, 3:44 PM

## 2024-06-16 NOTE — Progress Notes (Signed)
 Progress Note   Patient: [name] FMW:[id number] DOB: February 29, 1966 DOA: 03/04/2024     104 DOS: the patient was seen and examined on 06/16/2024   Brief hospital course: 58 year old with history significant for COPD, cocaine abuse, presented to the ED 03/04/24 for slower speech, confusion and right-sided weakness, CT head showed left MCA infarct, CTA head and neck revealed an M2 occlusion on the left,   Echocardiogram revealed large mobile left ventricular thrombus on the left and right ventricular apical thrombus in the right, ejection fraction less than 20%.  Cardiology was consulted recommended DOAC.  PT evaluated patient and recommended skilled nursing facility placement.  Patient was found to lack medical decision capacity/status and not amenable to determining his discharge disposition or leave AGAINST MEDICAL ADVICE.  He intermittently declines physical exams and medications. TOC is pursuing guardianship with APS as he is lacking medical decision capacity. Psychiatry presently recommending to await APS decision for guardianship before discharging home.   Assessment and Plan:  Acute ischemic CVA: -He underwent extensive workup and imaging revealed left MCA territory infarct, caudate nucleus, lentiform nucleus and internal capsule MRI confirmed. -TEE was noted for large mobile left lower clot just under the mitral valve with an EF of 20% and a small right apical clot with reduced RV systolic function. -He was started on Eliquis  for intracardiac thrombus, however he remained noncompliant.  In order to improve compliance, medicine changed to Xarelto  for once daily dosing -Psychiatry has deemed the patient to lack capacity; he continues to remain noncompliant at times -Unable to force meds.  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  Eliquis  changed to Xarelto , Coreg  long-acting.   Schizoaffective disorder bipolar disorder lacks medical capacity: Psych was consulted and recommended that the patient lacks medical capacity to make decisions if the patient attempts to leave AGAINST MEDICAL ADVICE he should be involuntarily committed. Psych recommending APS guardianship, TOC in touch with APS regarding this as patient lacks medical capacity   Essential hypertension:  on Coreg , Lasix , losartan , Aldactone .  Intermittent noncompliance   Hyperlipidemia: Continue Crestor .   Diabetes mellitus type 2:  last A1c of 5.4. refuses monitoring    Polysubstance abuse: UDS was positive for cocaine and benzos.   Physical Deconditioning: Ambulatory status complicated by previous right AKA and left BKA. - refusing therapy at times   History of COPD: Continue inhalers   History of DVT: On Xarelto  but frequently refuses  Disposition: Pending court date to determine if patient has capacity. Case management on board.     Subjective: No acute issues overnight.Resting comfortably in bed.  Physical Exam: Vitals:   06/15/24 2007 06/15/24 2347 06/16/24 0430 06/16/24 0738  BP: 122/88 118/64 125/87 121/86  Pulse: 78 71 67 73  Resp: 18 18 14 18   TempSrc:      SpO2: 97% 100% 100% 98%  Weight:      Height:       Constitutional: NAD, calm, comfortable Eyes: PERRL, lids and conjunctivae normal ENMT: Mucous membranes are moist. Posterior pharynx clear of any exudate or lesions.Normal dentition.  Neck: normal, supple, no masses, no thyromegaly Respiratory: clear to auscultation bilaterally, no wheezing, no crackles. Normal respiratory effort. No accessory muscle use.  Cardiovascular: Regular rate and rhythm, no murmurs / rubs / gallops. No extremity edema. 2+ pedal pulses.  No carotid bruits.  Abdomen: no tenderness, no masses palpated. No hepatosplenomegaly. Bowel sounds  positive.  Musculoskeletal: no clubbing / cyanosis. Skin: no rashes, lesions, ulcers. No induration Neurologic: CN 2-12 grossly intact. S/p b/l LE Amputee  Data Reviewed:  There are no new results to review at this time.  Family Communication: None at bedside  Disposition: Status is: Inpatient Remains inpatient appropriate because: pending SNF  Planned Discharge Destination: Skilled nursing facility    Time spent: 37 minutes  Author: Deliliah Room, MD 06/16/2024 10:06 AM  For on call review [email or web address].

## 2024-06-16 NOTE — Plan of Care (Signed)
   Problem: Nutrition: Goal: Adequate nutrition will be maintained Outcome: Progressing   Problem: Coping: Goal: Level of anxiety will decrease Outcome: Progressing   Problem: Elimination: Goal: Will not experience complications related to bowel motility Outcome: Progressing

## 2024-06-17 DIAGNOSIS — I639 Cerebral infarction, unspecified: Secondary | ICD-10-CM | POA: Diagnosis not present

## 2024-06-17 NOTE — Plan of Care (Signed)
   Problem: Nutrition: Goal: Adequate nutrition will be maintained Outcome: Progressing   Problem: Coping: Goal: Level of anxiety will decrease Outcome: Progressing   Problem: Elimination: Goal: Will not experience complications related to bowel motility Outcome: Progressing

## 2024-06-17 NOTE — Progress Notes (Signed)
 Progress Note   Patient: [name] FMW:[id number] DOB: [birth year] DOA: 03/04/2024     105 DOS: the patient was seen and examined on 06/17/2024   Brief hospital course: 58 year old with history significant for COPD, cocaine abuse, presented to the ED 03/04/24 for slower speech, confusion and right-sided weakness, CT head showed left MCA infarct, CTA head and neck revealed an M2 occlusion on the left,   Echocardiogram revealed large mobile left ventricular thrombus on the left and right ventricular apical thrombus in the right, ejection fraction less than 20%.  Cardiology was consulted recommended DOAC.  PT evaluated patient and recommended skilled nursing facility placement.  Patient was found to lack medical decision capacity/status and not amenable to determining his discharge disposition or leave AGAINST MEDICAL ADVICE.  He intermittently declines physical exams and medications. TOC is pursuing guardianship with APS as he is lacking medical decision capacity. Psychiatry presently recommending to await APS decision for guardianship before discharging home.   Assessment and Plan:  Acute ischemic CVA: -He underwent extensive workup and imaging revealed left MCA territory infarct, caudate nucleus, lentiform nucleus and internal capsule MRI confirmed. -TEE was noted for large mobile left lower clot just under the mitral valve with an EF of 20% and a small right apical clot with reduced RV systolic function. -He was started on Eliquis  for intracardiac thrombus, however he remained noncompliant.  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   Eliquis  changed to Xarelto , Coreg  long-acting.   Schizoaffective disorder bipolar disorder lacks medical capacity: Psych was consulted and recommended that the patient lacks medical capacity to make decisions if the patient attempts to leave AGAINST MEDICAL ADVICE he should be involuntarily committed. Psych recommending APS guardianship, TOC in touch with APS regarding this as patient lacks medical capacity   Essential hypertension:  on Coreg , Lasix , losartan , Aldactone .  Intermittent noncompliance   Hyperlipidemia: Continue Crestor .   Diabetes mellitus type 2:  last A1c of 5.4. refuses monitoring    Polysubstance abuse: UDS was positive for cocaine and benzos.   Physical Deconditioning: Ambulatory status complicated by previous right AKA and left BKA. - refusing therapy at times   History of COPD: Continue inhalers   History of DVT: On Xarelto  but frequently refuses  Disposition: Pending court date to determine if patient has capacity. Case management on board.     Subjective: No acute issues overnight. He is in the wheel chair in the hallways.  Physical Exam: Vitals:   06/16/24 1635 06/16/24 1950 06/17/24 0012 06/17/24 0914  BP: 91/72 116/81 (!) 132/94 130/88  Pulse: 87 72 71 78  Resp: 16  16   TempSrc:      SpO2: 98% 100% 99% 100%  Weight:      Height:       Constitutional: NAD, calm, comfortable Eyes: PERRL, lids and conjunctivae normal ENMT: Mucous membranes are moist. Posterior pharynx clear of any exudate or lesions.Normal dentition.  Neck: normal, supple, no masses, no thyromegaly Respiratory: clear to auscultation bilaterally, no wheezing, no crackles. Normal respiratory effort. No accessory muscle use.  Cardiovascular: Regular rate and rhythm, no murmurs / rubs / gallops. No extremity edema. 2+ pedal pulses. No carotid bruits.  Abdomen: no tenderness, no masses palpated. No hepatosplenomegaly. Bowel sounds positive.  Musculoskeletal: no clubbing /  cyanosis. Skin: no rashes, lesions, ulcers. No induration Neurologic: CN 2-12 grossly intact. S/p Left BKA and right AKA  Data Reviewed:  There are no new results to review at this time.  Family Communication: None at bedside  Disposition: Status is: Inpatient Remains inpatient appropriate because: pending SNF  Planned Discharge Destination: Skilled nursing facility    Time spent: 35 minutes  Author: Deliliah Room, MD 06/17/2024 9:26 AM  For on call review [email or web address].

## 2024-06-18 DIAGNOSIS — I639 Cerebral infarction, unspecified: Secondary | ICD-10-CM | POA: Diagnosis not present

## 2024-06-18 MED ORDER — HYDRALAZINE HCL 25 MG PO TABS
25.0000 mg | ORAL_TABLET | Freq: Four times a day (QID) | ORAL | Status: DC | PRN
  Administered 2024-07-07: 25 mg via ORAL
  Filled 2024-06-18 (×2): qty 1

## 2024-06-18 MED ORDER — HYDRALAZINE HCL 20 MG/ML IJ SOLN: 10.0000 mg | Freq: Four times a day (QID) | INTRAMUSCULAR | Status: DC | PRN

## 2024-06-18 NOTE — Plan of Care (Signed)
   Problem: Nutrition: Goal: Adequate nutrition will be maintained Outcome: Progressing   Problem: Coping: Goal: Level of anxiety will decrease Outcome: Progressing   Problem: Elimination: Goal: Will not experience complications related to bowel motility Outcome: Progressing

## 2024-06-18 NOTE — Plan of Care (Signed)
 Patient's RN informing that he is declining nighttime mitten vital checks.

## 2024-06-18 NOTE — Progress Notes (Signed)
 PROGRESS NOTE    [name]  FMW:[id number]  DOB: 09/21/1966  DOA: 03/04/2024 PCP: [name] Bleacher, MD Outpatient Specialists:   Hospital course:  58 year old with history significant for COPD, cocaine abuse, presented to the ED 03/04/24 for slower speech, confusion and right-sided weakness, CT head showed left MCA infarct, CTA head and neck revealed an M2 occlusion on the left,   Echocardiogram revealed large mobile left ventricular thrombus on the left and right ventricular apical thrombus in the right, ejection fraction less than 20%.  Cardiology was consulted recommended DOAC.  PT evaluated patient and recommended skilled nursing facility placement.  Patient was found to lack medical decision capacity/status and not amenable to determining his discharge disposition or leave AGAINST MEDICAL ADVICE.  He intermittently declines physical exams and medications. TOC is pursuing guardianship with APS as he is lacking medical decision capacity. Psychiatry presently recommending to await APS decision for guardianship before discharging home.   Subjective:  Patient without any new complaints, states he is doing well.   Objective: Vitals:   06/17/24 1928 06/17/24 2344 06/17/24 2347 06/18/24 1204  BP: (!) 126/100 (!) 165/112  (!) 156/89  Pulse: (!) 106 (!) 115 96 95  Resp: 16 16  16   Temp:    98 F (36.7 C)  TempSrc: Other (Comment) Other (Comment)    SpO2: 94% 98%  98%  Weight:      Height:       No intake or output data in the 24 hours ending 06/18/24 1620 Filed Weights   03/26/24 0431 03/28/24 0456 04/04/24 0500  Weight: 42.7 kg 47.5 kg 61.7 kg     Exam:  General: Patient lying in bed watching TV in NAD Respiratory: Normal work of breathing GI: NABS, soft, NT  LE: No edema  Data Reviewed:  Basic Metabolic Panel: No results for input(s): NA, K, CL, CO2, GLUCOSE, BUN, CREATININE, CALCIUM , MG, PHOS in the last 168 hours.  CBC: No results for  input(s): WBC, NEUTROABS, HGB, HCT, MCV, PLT in the last 168 hours.   Scheduled Meds:  carvedilol   10 mg Oral Daily   furosemide   40 mg Oral Daily   gabapentin   200 mg Oral BID   lisinopril   40 mg Oral Daily   revefenacin   175 mcg Nebulization Daily   rivaroxaban   20 mg Oral Q supper   rosuvastatin   5 mg Oral QHS   spironolactone   25 mg Oral Daily   Continuous Infusions:   Assessment & Plan:   Patient without any acute complaints Continue present management   Copied and pasted from previous notes: Acute ischemic CVA: -He underwent extensive workup and imaging revealed left MCA territory infarct, caudate nucleus, lentiform nucleus and internal capsule MRI confirmed. -TEE was noted for large mobile left lower clot just under the mitral valve with an EF of 20% and a small right apical clot with reduced RV systolic function. -He was started on Eliquis  for intracardiac thrombus, however he remained noncompliant.  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  Eliquis  changed to Xarelto , Coreg  long-acting.   Schizoaffective disorder bipolar disorder lacks medical capacity: Psych was consulted and recommended that the patient lacks medical capacity to make  decisions if the patient attempts to leave AGAINST MEDICAL ADVICE he should be involuntarily committed. Psych recommending APS guardianship, TOC in touch with APS regarding this as patient lacks medical capacity   Essential hypertension:  on Coreg , Lasix , losartan , Aldactone .  Intermittent noncompliance   Hyperlipidemia: Continue Crestor .   Diabetes mellitus type 2:   last A1c of 5.4   Polysubstance abuse: UDS was positive for cocaine and benzos on admission   Physical Deconditioning: Ambulatory status complicated by previous right AKA and left BKA. - refusing therapy at times   History of COPD: Continue inhalers   History of DVT: On Xarelto  but frequently refuses   Disposition: Pending court date to determine if patient has capacity. Case management on board.    DVT prophylaxis: Xarelto  Code Status: Full Family Communication: None today     Studies: No results found.  Principal Problem:   Acute CVA (cerebrovascular accident) (HCC) Active Problems:   LV (left ventricular) mural thrombus   Chronic systolic CHF (congestive heart failure) (HCC)   Impaired decision making   Prolonged QT interval   Elevated liver enzymes   COPD (chronic obstructive pulmonary disease) (HCC)   Cocaine abuse (HCC)   Schizoaffective disorder, bipolar type (HCC)   Benign essential HTN   Acid reflux   History of DVT (deep vein thrombosis)   Type 2 diabetes mellitus (HCC)   Status post bilateral BKA.   Dilated cardiomyopathy (HCC)     [name], [name]  If 7PM-7AM, please contact night-coverage [email or web address]   LOS: 106 days

## 2024-06-18 NOTE — Plan of Care (Signed)
   Problem: Coping: Goal: Level of anxiety will decrease Outcome: Progressing   Problem: Elimination: Goal: Will not experience complications related to urinary retention Outcome: Progressing   Problem: Pain Managment: Goal: General experience of comfort will improve and/or be controlled Outcome: Progressing

## 2024-06-19 DIAGNOSIS — I639 Cerebral infarction, unspecified: Secondary | ICD-10-CM | POA: Diagnosis not present

## 2024-06-19 NOTE — Progress Notes (Signed)
 Progress Note   Patient: [name] FMW:[id number] DOB: December 18, 1965 DOA: 03/04/2024     107 DOS: the patient was seen and examined on 06/19/2024   Brief hospital course: 58 year old with history significant for COPD, cocaine abuse, presented to the ED 03/04/24 for slower speech, confusion and right-sided weakness, CT head showed left MCA infarct, CTA head and neck revealed an M2 occlusion on the left,   Echocardiogram revealed large mobile left ventricular thrombus on the left and right ventricular apical thrombus in the right, ejection fraction less than 20%.  Cardiology was consulted recommended DOAC.  PT evaluated patient and recommended skilled nursing facility placement.  Patient was found to lack medical decision capacity/status and not amenable to determining his discharge disposition or leave AGAINST MEDICAL ADVICE.  He intermittently declines physical exams and medications. TOC is pursuing guardianship with APS as he is lacking medical decision capacity. Psychiatry presently recommending to await APS decision for guardianship before discharging home.   Assessment and Plan:  Acute ischemic CVA: -He underwent extensive workup and imaging revealed left MCA territory infarct, caudate nucleus, lentiform nucleus and internal capsule MRI confirmed. -TEE was noted for large mobile left lower clot just under the mitral valve with an EF of 20% and a small right apical clot with reduced RV systolic function. -He was started on Eliquis  for intracardiac thrombus, however he remained noncompliant.  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   Eliquis  changed to Xarelto , Coreg  long-acting.   Schizoaffective disorder bipolar disorder lacks medical capacity: Psych was consulted and recommended that the patient lacks medical capacity to make decisions if the patient attempts to leave AGAINST MEDICAL ADVICE he should be involuntarily committed. Psych recommending APS guardianship, TOC in touch with APS regarding this as patient lacks medical capacity   Essential hypertension:  on Coreg , Lasix , losartan , Aldactone .  Intermittent noncompliance   Hyperlipidemia: Continue Crestor .   Diabetes mellitus type 2:  last A1c of 5.4. refuses monitoring    Polysubstance abuse: UDS was positive for cocaine and benzos.   Physical Deconditioning: Ambulatory status complicated by previous right AKA and left BKA. - refusing therapy at times   History of COPD: Continue inhalers   History of DVT: On Xarelto  but frequently refuses  Disposition: Pending court date to determine if patient has capacity. Case management on board.     Subjective: No acute issues overnight. Denies any active complaints.  Physical Exam: Vitals:   06/17/24 1928 06/17/24 2344 06/18/24 1204 06/19/24 0736  BP:   (!) 156/89 (!) 111/59  Pulse:   95 (!) 51  Resp:   16 18  Temp:   98 F (36.7 C)   TempSrc: Other (Comment) Other (Comment)    SpO2:   98% 100%  Weight:      Height:       Constitutional: NAD, calm, comfortable Eyes: PERRL, lids and conjunctivae normal ENMT: Mucous membranes are moist. Posterior pharynx clear of any exudate or lesions.Normal dentition.  Neck: normal, supple, no masses, no thyromegaly Respiratory: clear to auscultation bilaterally, no wheezing, no crackles. Normal respiratory effort. No accessory muscle use.  Cardiovascular: Regular rate and rhythm, no murmurs / rubs / gallops. No extremity  edema. 2+ pedal pulses. No carotid bruits.  Abdomen: no tenderness, no masses palpated. No hepatosplenomegaly. Bowel sounds positive.   Musculoskeletal: no clubbing / cyanosis. Skin: no rashes, lesions, ulcers. No induration Neurologic: CN 2-12 grossly intact. S/p Left BKA and right AKA  Data Reviewed:  There are no new results to review at this time.  Family Communication: None at bedside  Disposition: Status is: Inpatient Remains inpatient appropriate because: pending SNF  Planned Discharge Destination: Skilled nursing facility    Time spent: 36 minutes  Author: Deliliah Room, MD 06/19/2024 9:10 AM  For on call review [email or web address].

## 2024-06-19 NOTE — Plan of Care (Signed)
 Down to choose lunch today. Took all meds today. Pleasant attitude   Problem: Skin Integrity: Goal: Risk for impaired skin integrity will decrease Outcome: Progressing   Problem: Safety: Goal: Ability to remain free from injury will improve Outcome: Progressing   Problem: Pain Managment: Goal: General experience of comfort will improve and/or be controlled Outcome: Progressing   Problem: Coping: Goal: Level of anxiety will decrease Outcome: Progressing

## 2024-06-20 DIAGNOSIS — I639 Cerebral infarction, unspecified: Secondary | ICD-10-CM | POA: Diagnosis not present

## 2024-06-20 NOTE — Progress Notes (Signed)
 Progress Note   Patient: [name] FMW:[id number] DOB: 04/15/1966 DOA: 03/04/2024     108 DOS: the patient was seen and examined on 06/20/2024   Brief hospital course: 58 year old with history significant for COPD, cocaine abuse, presented to the ED 03/04/24 for slower speech, confusion and right-sided weakness, CT head showed left MCA infarct, CTA head and neck revealed an M2 occlusion on the left,   Echocardiogram revealed large mobile left ventricular thrombus on the left and right ventricular apical thrombus in the right, ejection fraction less than 20%.  Cardiology was consulted recommended DOAC.  PT evaluated patient and recommended skilled nursing facility placement.  Patient was found to lack medical decision capacity/status and not amenable to determining his discharge disposition or leave AGAINST MEDICAL ADVICE.  He intermittently declines physical exams and medications. TOC is pursuing guardianship with APS as he is lacking medical decision capacity. Psychiatry presently recommending to await APS decision for guardianship before discharging home.   Assessment and Plan:  Acute ischemic CVA: -He underwent extensive workup and imaging revealed left MCA territory infarct, caudate nucleus, lentiform nucleus and internal capsule MRI confirmed. -TEE was noted for large mobile left lower clot just under the mitral valve with an EF of 20% and a small right apical clot with reduced RV systolic function. -He was started on Eliquis  for intracardiac thrombus, however he remained noncompliant.  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   Eliquis  changed to Xarelto , Coreg  long-acting.   Schizoaffective disorder bipolar disorder lacks medical capacity: Psych was consulted and recommended that the patient lacks medical capacity to make decisions if the patient attempts to leave AGAINST MEDICAL ADVICE he should be involuntarily committed. Psych recommending APS guardianship, TOC in touch with APS regarding this as patient lacks medical capacity   Essential hypertension:  on Coreg , Lasix , losartan , Aldactone .  Intermittent noncompliance   Hyperlipidemia: Continue Crestor .   Diabetes mellitus type 2:  last A1c of 5.4. refuses monitoring    Polysubstance abuse: UDS was positive for cocaine and benzos.   Physical Deconditioning: Ambulatory status complicated by previous right AKA and left BKA. - LLE prosthesis has been delivered.   History of COPD: Continue inhalers   History of DVT: On Xarelto  but frequently refuses  Disposition: Pending court date to determine if patient has capacity. Case management on board.     Subjective: No acute issues overnight. Denies any active complaints. LLE prosthesis was delivered.  Physical Exam: Vitals:   06/17/24 1928 06/17/24 2344 06/19/24 0736 06/19/24 1508  BP:   (!) 111/59 106/80  Pulse:   (!) 51 87  Resp:   18 19  TempSrc: Other (Comment) Other (Comment)  Oral  SpO2:   100% 95%  Weight:      Height:       Constitutional: NAD, calm, comfortable Eyes: PERRL, lids and conjunctivae normal ENMT: Mucous membranes are moist. Posterior pharynx clear of any exudate or lesions.Normal dentition.  Neck: normal, supple, no masses, no thyromegaly Respiratory: clear to auscultation bilaterally, no wheezing, no crackles. Normal respiratory effort. No accessory muscle use.  Cardiovascular: Regular rate and rhythm, no murmurs / rubs / gallops. No extremity edema. 2+ pedal pulses. No  carotid bruits.  Abdomen: no tenderness, no masses palpated. No hepatosplenomegaly. Bowel sounds  positive.  Musculoskeletal: no clubbing / cyanosis. Skin: no rashes, lesions, ulcers. No induration Neurologic: CN 2-12 grossly intact. S/p Left BKA and right AKA  Data Reviewed:  There are no new results to review at this time.  Family Communication: None at bedside  Disposition: Status is: Inpatient Remains inpatient appropriate because: pending SNF  Planned Discharge Destination: Skilled nursing facility    Time spent: 36 minutes  Author: Deliliah Room, MD 06/20/2024 9:11 AM  For on call review [email or web address].

## 2024-06-20 NOTE — Plan of Care (Signed)
 Patient refusing all the nighttime medications.  Manjit Bufano, MD [facility] 06/20/2024, 9:38 PM

## 2024-06-21 DIAGNOSIS — I639 Cerebral infarction, unspecified: Secondary | ICD-10-CM | POA: Diagnosis not present

## 2024-06-21 NOTE — Plan of Care (Addendum)
 Pt alert and oriented, has been moving back and forth between the bed and his wheelchair. Pt had a couple friends that came to visit him tonight and brought him food. Pt refused VS checks and nighttime meds which the doctor is aware. Found pt laying on the floor this morning, when I asked if he fell, he denied saying he prefers laying on the floor and purposely laid there. Pt refused to get up to the beds or his wheelchair and keeps on saying he's okay and dismissed me. I offered him pillows and blankets which he also refused.  Problem: Nutrition: Goal: Adequate nutrition will be maintained Outcome: Progressing   Problem: Coping: Goal: Level of anxiety will decrease Outcome: Progressing   Problem: Pain Managment: Goal: General experience of comfort will improve and/or be controlled Outcome: Progressing   Problem: Safety: Goal: Ability to remain free from injury will improve Outcome: Progressing   Problem: Skin Integrity: Goal: Risk for impaired skin integrity will decrease Outcome: Progressing

## 2024-06-21 NOTE — Progress Notes (Signed)
 Physical Therapy Treatment Patient Details Name: [name] MRN: [id number] DOB: July 12, 1966 Today's Date: 06/21/2024   History of Present Illness Patient is 58 y.o. male presented to ED on 03/04/24 with stroke like symptoms when caregiver found him with slurred speech and right sided weakness. Head CT reveals Acute left MCA territory infarct affecting the insula, caudate nucleus, lentiform nucleus and internal capsule. PMH significant of  Schizoaffective disorder, heart failure with reduced ejection fraction, pretension, DMII, COPD, s/p bilateral amputations, chronic pain, and polysubstance abuse.    PT Comments  Continuing work on functional mobility and activity tolerance, and Randall agrees to work with his prosthesis; noting pt had more difficulty donning prosthesis than he had previous session; after an initial moment of difficulty transferring to the mat table, Clemon changed his approach to a lateral scoot, and performed it well; Stood x3 to RW from elevated mat table onto L prosthesis, each stand lasting 5-20 seconds; able to get to upright standing and hips near fully extended; Prosthesis with a looser than optimal fit; Likely needs a few plys of sock to redistribute pressure    If plan is discharge home, recommend the following: Supervision due to cognitive status;Direct supervision/assist for financial management;Direct supervision/assist for medications management;Assist for transportation   Can travel by private vehicle      (perhaps soon)  Equipment Recommendations  None recommended by PT    Recommendations for Other Services OT consult;Speech consult     Precautions / Restrictions Precautions Precautions: Fall Recall of Precautions/Restrictions: Impaired Precaution/Restrictions Comments: h/o BLE amputations Required Braces or Orthoses: Other Brace Other Brace: L prosthesis Restrictions Weight Bearing Restrictions Per Provider Order: No     Mobility  Bed Mobility                General bed mobility comments: Pt in w/c on arrival    Transfers Overall transfer level: Needs assistance Equipment used: None, Rolling walker (2 wheels) Transfers: Bed to chair/wheelchair/BSC, Sit to/from Stand Sit to Stand: Min assist          Lateral/Scoot Transfers: Supervision General transfer comment: Began to perform the transfer to the mat table facing the mat with wheelchair about 5 inches away from the table; after experiencing difficulty with this approach, performed lateral scoot transfer wheelchair to mat table with L prosthesis on with supervision; Stood x3 to rW form elevated mat table each stand lasting 5-20 seconds    Ambulation/Gait             Pre-gait activities: In standing on L prosthesis with RW, Vikrant was able to pick up his L foot and hold that position for 5 seconds; CGA for safety     Stairs             Wheelchair Mobility     Tilt Bed    Modified Rankin (Stroke Patients Only) Modified Rankin (Stroke Patients Only) Pre-Morbid Rankin Score: Slight disability Modified Rankin: Moderate disability     Balance                                            Communication Communication Communication: No apparent difficulties (word finding difficulties)  Cognition Arousal: Alert Behavior During Therapy: WFL for tasks assessed/performed   PT - Cognitive impairments: Memory, Problem solving  PT - Cognition Comments: Initiated donning prosthesis liner inside out, self-corrected when end of limb came in contact with pin; mod assist to start correctly Following commands: Intact Following commands impaired: Follows one step commands inconsistently, Follows one step commands with increased time    Cueing Cueing Techniques: Verbal cues, Visual cues  Exercises      General Comments        Pertinent Vitals/Pain Pain Assessment Pain Assessment: Faces Faces Pain Scale: Hurts  little more Pain Location: L residual limb during standing trials Pain Descriptors / Indicators: Grimacing Pain Intervention(s): Monitored during session    Home Living                          Prior Function            PT Goals (current goals can now be found in the care plan section) Acute Rehab PT Goals Patient Stated Goal: Agreeable to working on prosthesis training PT Goal Formulation: With patient Time For Goal Achievement: 07/12/24 Potential to Achieve Goals: Fair Progress towards PT goals: Progressing toward goals    Frequency    Min 2X/week      PT Plan      Co-evaluation              AM-PAC PT 6 Clicks Mobility   Outcome Measure  Help needed turning from your back to your side while in a flat bed without using bedrails?: None Help needed moving from lying on your back to sitting on the side of a flat bed without using bedrails?: None Help needed moving to and from a bed to a chair (including a wheelchair)?: A Little Help needed standing up from a chair using your arms (e.g., wheelchair or bedside chair)?: Total Help needed to walk in hospital room?: Total Help needed climbing 3-5 steps with a railing? : Total 6 Click Score: 14    End of Session   Activity Tolerance: Patient tolerated treatment well Patient left: Other (comment) (rolling with wheelchair in hallway) Nurse Communication: Mobility status PT Visit Diagnosis: Other abnormalities of gait and mobility (R26.89);Muscle weakness (generalized) (M62.81);Other symptoms and signs involving the nervous system (R29.898);Hemiplegia and hemiparesis Hemiplegia - Right/Left: Right Hemiplegia - dominant/non-dominant: Dominant Hemiplegia - caused by: Cerebral infarction     Time: 1200-1230 PT Time Calculation (min) (ACUTE ONLY): 30 min  Charges:    $Therapeutic Activity: 23-37 mins PT General Charges $$ ACUTE PT VISIT: 1 Visit                     [name], PT  Acute  Rehabilitation Services Office [phone] Secure Chat welcomed    [name] 06/21/2024, 5:10 PM

## 2024-06-21 NOTE — Plan of Care (Signed)
  Problem: Nutrition: Goal: Adequate nutrition will be maintained Outcome: Progressing   Problem: Coping: Goal: Level of anxiety will decrease Outcome: Progressing   Problem: Elimination: Goal: Will not experience complications related to bowel motility Outcome: Progressing Goal: Will not experience complications related to urinary retention Outcome: Progressing   Problem: Pain Managment: Goal: General experience of comfort will improve and/or be controlled Outcome: Progressing   Problem: Safety: Goal: Ability to remain free from injury will improve Outcome: Progressing   Problem: Skin Integrity: Goal: Risk for impaired skin integrity will decrease Outcome: Progressing

## 2024-06-21 NOTE — Progress Notes (Signed)
 Progress Note   Patient: [name] FMW:[id number] DOB: 28-Aug-1966 DOA: 03/04/2024     109 DOS: the patient was seen and examined on 06/21/2024   Brief hospital course: 58 year old with history significant for COPD, cocaine abuse, presented to the ED 03/04/24 for slower speech, confusion and right-sided weakness, CT head showed left MCA infarct, CTA head and neck revealed an M2 occlusion on the left,   Echocardiogram revealed large mobile left ventricular thrombus on the left and right ventricular apical thrombus in the right, ejection fraction less than 20%.  Cardiology was consulted recommended DOAC.  PT evaluated patient and recommended skilled nursing facility placement.  Patient was found to lack medical decision capacity/status and not amenable to determining his discharge disposition or leave AGAINST MEDICAL ADVICE.  He intermittently declines physical exams and medications. TOC is pursuing guardianship with APS as he is lacking medical decision capacity. Psychiatry presently recommending to await APS decision for guardianship before discharging home.   Assessment and Plan:  Acute ischemic CVA: -He underwent extensive workup and imaging revealed left MCA territory infarct, caudate nucleus, lentiform nucleus and internal capsule MRI confirmed. -TEE was noted for large mobile left lower clot just under the mitral valve with an EF of 20% and a small right apical clot with reduced RV systolic function. -He was started on Eliquis  for intracardiac thrombus, however he remained noncompliant.  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   Eliquis  changed to Xarelto , Coreg  long-acting.   Schizoaffective disorder bipolar disorder lacks medical capacity: Psych was consulted and recommended that the patient lacks medical capacity to make decisions if the patient attempts to leave AGAINST MEDICAL ADVICE he should be involuntarily committed. Psych recommending APS guardianship, TOC in touch with APS regarding this as patient lacks medical capacity   Essential hypertension:  on Coreg , Lasix , losartan , Aldactone .  Intermittent noncompliance   Hyperlipidemia: Continue Crestor .   Diabetes mellitus type 2:  last A1c of 5.4. refuses monitoring    Polysubstance abuse: UDS was positive for cocaine and benzos.   Physical Deconditioning: Ambulatory status complicated by previous right AKA and left BKA. - LLE prosthesis has been delivered.   History of COPD: Continue inhalers   History of DVT: On Xarelto  but frequently refuses  Disposition: Pending court date to determine if patient has capacity. Case management on board.     Subjective: He refused his night time meds. No active complaints.  Physical Exam: Vitals:   06/17/24 1928 06/17/24 2344 06/19/24 0736 06/19/24 1508  BP:   (!) 111/59 106/80  Pulse:   (!) 51 87  Resp:   18 19  TempSrc: Other (Comment) Other (Comment)  Oral  SpO2:   100% 95%  Weight:      Height:       Constitutional: NAD, calm, comfortable Eyes: PERRL, lids and conjunctivae normal ENMT: Mucous membranes are moist. Posterior pharynx clear of any exudate or lesions.Normal dentition.  Neck: normal, supple, no masses, no thyromegaly Respiratory: clear to auscultation bilaterally, no wheezing, no crackles. Normal respiratory effort. No accessory muscle use.  Cardiovascular: Regular rate and rhythm, no murmurs / rubs / gallops. No extremity edema. 2+ pedal pulses. No carotid bruits.  Abdomen: no tenderness, no masses palpated. No hepatosplenomegaly. Bowel sounds positive.  Musculoskeletal: no clubbing  / cyanosis. Skin: no rashes, lesions, ulcers. No induration Neurologic: CN 2-12 grossly intact. S/p Left BKA and right AKA  Data Reviewed:  There are no new results to review at this time.  Family Communication: None at bedside  Disposition: Status is: Inpatient Remains inpatient appropriate because: pending SNF  Planned Discharge Destination: Skilled nursing facility    Time spent: 35 minutes  Author: Deliliah Room, MD 06/21/2024 9:26 AM  For on call review [email or web address].

## 2024-06-22 DIAGNOSIS — I639 Cerebral infarction, unspecified: Secondary | ICD-10-CM | POA: Diagnosis not present

## 2024-06-22 NOTE — TOC Progression Note (Signed)
 Transition of Care [facility]) - Progression Note    Patient Details  Name: [name] MRN: [id number] Date of Birth: 02-Apr-1966  [facility] CM/SW Contact  [name], [name] Phone Number: 06/22/2024, 1:45 PM  Clinical Narrative:   CSW attempted to reach patient's GAL, [name] [phone]) to see if he needs any documentation from the hospital in preparation for patient's court hearing on July 29. Left a voicemail, awaiting call back.    Expected Discharge Plan: Skilled Nursing Facility Barriers to Discharge: English as a second language teacher, Active Substance Use - Placement, SNF Pending bed offer               Expected Discharge Plan and Services       Living arrangements for the past 2 months: Apartment Expected Discharge Date: 03/14/24                                     Social Drivers of Health (SDOH) Interventions SDOH Screenings   Food Insecurity: Food Insecurity Present (03/06/2024)  Housing: High Risk (03/06/2024)  Transportation Needs: No Transportation Needs (03/06/2024)  Utilities: Patient Declined (03/06/2024)  Alcohol Screen: Medium Risk (06/24/2019)  Depression (PHQ2-9): Low Risk  (02/18/2024)  Recent Concern: Depression (PHQ2-9) - Medium Risk (01/21/2024)  Financial Resource Strain: High Risk (07/24/2021)   Received from [facility]  Physical Activity: Inactive (02/18/2024)  Social Connections: Unknown (04/05/2022)   Received from [facility]  Stress: Stress Concern Present (07/24/2021)   Received from [facility]  Tobacco Use: High Risk (03/04/2024)  Health Literacy: Adequate Health Literacy (02/18/2024)    Readmission Risk Interventions    12/21/2023    9:25 AM 12/09/2022    9:02 AM 12/01/2022    3:56 PM  Readmission Risk Prevention Plan  Transportation Screening Complete Complete Complete  HRI or Home Care Consult Complete    Social Work Consult for Recovery Care Planning/Counseling Complete    Palliative Care Screening Not  Applicable    Medication Review Oceanographer) Complete Complete Complete  PCP or Specialist appointment within 3-5 days of discharge  Complete Complete  HRI or Home Care Consult  Complete Complete  SW Recovery Care/Counseling Consult  Complete Complete  Palliative Care Screening  Not Applicable Not Applicable  Skilled Nursing Facility  Not Applicable Not Applicable  SNF Comments   n/a

## 2024-06-22 NOTE — Progress Notes (Signed)
 Progress Note    [name]  FMW:[id number] DOB: [birth year]  DOA: 03/04/2024 PCP: Tanda Bleacher, MD      Brief Narrative:    Medical records reviewed and are as summarized below:  [name] is a 58 y.o. male with medical history significant for COPD, cocaine abuse, presented to the ED 03/04/24 for slower speech, confusion and right-sided weakness, CT head showed left MCA infarct, CTA head and neck revealed an M2 occlusion on the left,   Echocardiogram revealed large mobile left ventricular thrombus on the left and right ventricular apical thrombus in the right, ejection fraction less than 20%.  Cardiology was consulted recommended DOAC.  PT evaluated patient and recommended skilled nursing facility placement.  Patient was found to lack medical decision capacity/status and not amenable to determining his discharge disposition or leave AGAINST MEDICAL ADVICE.  He intermittently declines physical exams and medications. TOC is pursuing guardianship with APS as he is lacking medical decision capacity. Psychiatry presently recommending to await APS decision for guardianship before discharging home.     Assessment/Plan:   Principal Problem:   Acute CVA (cerebrovascular accident) (HCC) Active Problems:   LV (left ventricular) mural thrombus   Chronic systolic CHF (congestive heart failure) (HCC)   Impaired decision making   Prolonged QT interval   Elevated liver enzymes   COPD (chronic obstructive pulmonary disease) (HCC)   Cocaine abuse (HCC)   Schizoaffective disorder, bipolar type (HCC)   Benign essential HTN   Acid reflux   History of DVT (deep vein thrombosis)   Type 2 diabetes mellitus (HCC)   Status post bilateral BKA.   Dilated cardiomyopathy (HCC)    Body mass index is 20.09 kg/m.   Acute ischemic CVA: -He underwent extensive workup and imaging revealed left MCA territory infarct, caudate nucleus, lentiform nucleus and internal capsule MRI confirmed. -TEE  was noted for large mobile left lower clot just under the mitral valve with an EF of 20% and a small right apical clot with reduced RV systolic function. -He was started on Eliquis  for intracardiac thrombus, however he remained noncompliant.  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 Psych recommending APS guardianship, TOC in touch with APS regarding this as patient lacks medical capacity   Essential hypertension:  on Coreg , Lasix , losartan , Aldactone .  Intermittent noncompliance   Hyperlipidemia: Continue Crestor .   Diabetes mellitus type 2:  last A1c of 5.4. refuses monitoring    Polysubstance abuse: UDS was positive for cocaine and benzos.   Physical Deconditioning: Ambulatory status complicated by previous right AKA and left BKA. - LLE prosthesis has been delivered.   History of COPD: Bronchodilators as needed   History of DVT: On Xarelto  but frequently refuses   Disposition: Pending court date for competency. Case management on board.            Diet Order  Diet regular Fluid consistency: Thin  Diet effective now           Diet - low sodium heart healthy                            Consultants: Cardiologist Psychiatrist Intensivist Neurologist  Procedures: None    Medications:    carvedilol    10 mg Oral Daily   furosemide   40 mg Oral Daily   gabapentin   200 mg Oral BID   lisinopril   40 mg Oral Daily   revefenacin   175 mcg Nebulization Daily   rivaroxaban   20 mg Oral Q supper   rosuvastatin   5 mg Oral QHS   spironolactone   25 mg Oral Daily   Continuous Infusions:   Anti-infectives (From admission, onward)    None              Family Communication/Anticipated D/C date and plan/Code Status   DVT prophylaxis:  rivaroxaban  (XARELTO ) tablet 20 mg     Code Status: Full Code  Family Communication: None Disposition Plan: To be determined.   Status is: Inpatient Remains inpatient appropriate because: No safe discharge plan.  Awaiting placement       Subjective:   Interval  events noted.  Patient was not in a good mood for this encounter.  He said Hey!  Cut the light out!  Objective:    Vitals:   06/17/24 2344 06/19/24 0736 06/19/24 1508 06/21/24 1940  BP:    (!) 117/97  Pulse:  (!) 51 87 84  Resp:  18 19 18   TempSrc: Other (Comment)  Oral Other (Comment)  SpO2:  100% 95% 100%  Weight:      Height:       No data found.  No intake or output data in the 24 hours ending 06/22/24 1709 Filed Weights   03/26/24 0431 03/28/24 0456 04/04/24 0500  Weight: 42.7 kg 47.5 kg 61.7 kg    Exam: Limited exam because patient refused complete exam  GEN: No acute distress EXT: Bilateral leg amputee.  Prosthesis while in his bed         Data Reviewed:   I have personally reviewed following labs and imaging studies:  Labs: Labs show the following:   Basic Metabolic Panel: No results for input(s): NA, K, CL, CO2, GLUCOSE, BUN, CREATININE, CALCIUM , MG, PHOS in the last 168 hours. GFR CrCl cannot be calculated (Patient's most recent lab result is older than the maximum 21 days allowed.). Liver Function Tests: No results for input(s): AST, ALT, ALKPHOS, BILITOT, PROT, ALBUMIN in the last 168 hours. No results for  input(s): LIPASE, AMYLASE in the last 168 hours. No results for input(s): AMMONIA in the last 168 hours. Coagulation profile No results for input(s): INR, PROTIME in the last 168 hours.  CBC: No results for input(s): WBC, NEUTROABS, HGB, HCT, MCV, PLT in the last 168 hours. Cardiac Enzymes: No results for input(s): CKTOTAL, CKMB, CKMBINDEX, TROPONINI in the last 168 hours. BNP (last 3 results) No results for input(s): PROBNP in the last 8760 hours. CBG: No results for input(s): GLUCAP in the last 168 hours. D-Dimer: No results for input(s): DDIMER in the last 72 hours. Hgb A1c: No results for input(s): HGBA1C in the last 72 hours. Lipid Profile: No results for input(s): CHOL, HDL, LDLCALC, TRIG, CHOLHDL, LDLDIRECT in the last 72 hours. Thyroid  function studies: No results for input(s): TSH, T4TOTAL, T3FREE, THYROIDAB in the last 72 hours.  Invalid input(s): FREET3 Anemia work up: No results for input(s): VITAMINB12, FOLATE, FERRITIN, TIBC, IRON, RETICCTPCT in the last 72 hours. Sepsis Labs: No results for input(s): PROCALCITON, WBC, LATICACIDVEN in the last 168 hours.  Microbiology No results found for this or any previous visit (from the past 240 hours).  Procedures and diagnostic studies:  No results found.             LOS: 110 days   Debra Colon  [employer] Chartered loss adjuster on [email or web address]. If 7PM-7AM, please contact night-coverage at [email or web address]     06/22/2024, 5:09 PM

## 2024-06-22 NOTE — Progress Notes (Signed)
   06/22/24 1630  Spiritual Encounters  Type of Visit Attempt (pt unavailable)  Care provided to: Pt not available  Reason for visit Routine spiritual support  OnCall Visit No    Chaplain attempted visit, however [name] was sleeping. I will try again later this week.

## 2024-06-23 DIAGNOSIS — I639 Cerebral infarction, unspecified: Secondary | ICD-10-CM | POA: Diagnosis not present

## 2024-06-23 NOTE — Progress Notes (Signed)
 Occupational Therapy Treatment Patient Details Name: [name] MRN: [id number] DOB: [birth year] Today's Date: 06/23/2024   History of present illness Patient is 58 y.o. male presented to ED on 03/04/24 with stroke like symptoms when caregiver found him with slurred speech and right sided weakness. Head CT reveals Acute left MCA territory infarct affecting the insula, caudate nucleus, lentiform nucleus and internal capsule. PMH significant of  Schizoaffective disorder, heart failure with reduced ejection fraction, pretension, DMII, COPD, s/p bilateral amputations, chronic pain, and polysubstance abuse.   OT comments  Patient in bed upon arrival and able to transfer to wheelchair with lateral scoot transfers without assistance. Patient able to doff clothing seated in chair and performed transfer to tub bench with CGA for safety. Patient able to perform bathing seated on tub bench with supervision and was able to dress self seated in wheelchair with min assist to pull up pants with patient using BUEs to clear bottom from chair. Patient performed wheelchair mobility off unit with supervision and rest breaks. Discharge recommendations continue to be appropriate.  Acute OT to continue to follow to address established goals to facilitate DC to next venue of care.        If plan is discharge home, recommend the following:  A little help with walking and/or transfers;A little help with bathing/dressing/bathroom;Supervision due to cognitive status;Direct supervision/assist for financial management;Direct supervision/assist for medications management   Equipment Recommendations  None recommended by OT    Recommendations for Other Services      Precautions / Restrictions Precautions Precautions: Fall Recall of Precautions/Restrictions: Impaired Precaution/Restrictions Comments: h/o BLE amputations Required Braces or Orthoses: Other Brace Other Brace: L prosthesis Restrictions Weight Bearing  Restrictions Per Provider Order: No       Mobility Bed Mobility Overal bed mobility: Modified Independent Bed Mobility: Supine to Sit     Supine to sit: Modified independent (Device/Increase time)     General bed mobility comments: patient in bed upon arrival and transferred to wheelchair without assistance    Transfers Overall transfer level: Needs assistance Equipment used: None Transfers: Bed to chair/wheelchair/BSC, Sit to/from Stand            Lateral/Scoot Transfers: Contact guard assist General transfer comment: CGA for tub bench trasnfers for safety     Balance Overall balance assessment: Mild deficits observed, not formally tested Sitting-balance support: Bilateral upper extremity supported Sitting balance-Leahy Scale: Good                                     ADL either performed or assessed with clinical judgement   ADL Overall ADL's : Needs assistance/impaired Eating/Feeding: Independent;Sitting   Grooming: Wash/dry hands;Independent;Sitting   Upper Body Bathing: Supervision/ safety;Sitting Upper Body Bathing Details (indicate cue type and reason): in shower Lower Body Bathing: Supervison/ safety;Sitting/lateral leans Lower Body Bathing Details (indicate cue type and reason): in shower Upper Body Dressing : Set up;Sitting Upper Body Dressing Details (indicate cue type and reason): donned T-shirt Lower Body Dressing: Minimal assistance;Sitting/lateral leans Lower Body Dressing Details (indicate cue type and reason): assistance pulling up pants while in the chair and patient using UEs to clear bottom         Tub/ Shower Transfer: Contact guard assist;Tub bench Tub/Shower Transfer Details (indicate cue type and reason): CGA for safety with lateral scoot transfers to tub bench   General ADL Comments: increased assistance with donning pants, less assistance needed with shorts  Extremity/Trunk Assessment              Vision        Restaurant manager, fast food Communication: Other (comment) (word finding difficulties) Factors Affecting Communication: Difficulty expressing self;Reduced clarity of speech   Cognition Arousal: Alert Behavior During Therapy: WFL for tasks assessed/performed Cognition: Cognition impaired           Executive functioning impairment (select all impairments): Problem solving                   Following commands: Intact Following commands impaired: Follows one step commands inconsistently, Follows one step commands with increased time      Cueing   Cueing Techniques: Verbal cues, Visual cues  Exercises Exercises: Other exercises Other Exercises Other Exercises: wheelchair mobility performed in halls with supervision with rest breaks    Shoulder Instructions       General Comments      Pertinent Vitals/ Pain       Pain Assessment Pain Assessment: No/denies pain Pain Intervention(s): Monitored during session  Home Living                                          Prior Functioning/Environment              Frequency  Min 1X/week        Progress Toward Goals  OT Goals(current goals can now be found in the care plan section)  Progress towards OT goals: Progressing toward goals  Acute Rehab OT Goals Patient Stated Goal: to increase independence OT Goal Formulation: With patient Time For Goal Achievement: 06/25/24 Potential to Achieve Goals: Fair ADL Goals Pt Will Perform Lower Body Bathing: with mod assist;sitting/lateral leans Pt Will Perform Lower Body Dressing: with modified independence;bed level Pt Will Transfer to Toilet: with mod assist;with transfer board;bedside commode Pt Will Perform Toileting - Clothing Manipulation and hygiene: with mod assist;sitting/lateral leans Pt/caregiver will Perform Home Exercise Program: Right Upper extremity;Independently;With written HEP provided (to increase  coordination with use of RUE) Additional ADL Goal #1: Pt will transfer to w/c with Mod I as a precursor for OOB ADLs Additional ADL Goal #2: Pt will utilize RUE to gather 5/5 objects without overshooting. Additional ADL Goal #3: Pt will demonstrate proper safety with transfers/mobility 100% of the time during session.  Plan      Co-evaluation                 AM-PAC OT 6 Clicks Daily Activity     Outcome Measure   Help from another person eating meals?: None Help from another person taking care of personal grooming?: A Little Help from another person toileting, which includes using toliet, bedpan, or urinal?: A Lot Help from another person bathing (including washing, rinsing, drying)?: A Little Help from another person to put on and taking off regular upper body clothing?: A Little Help from another person to put on and taking off regular lower body clothing?: A Little 6 Click Score: 18    End of Session Equipment Utilized During Treatment: Other (comment) (wheelchair, tub bench)  OT Visit Diagnosis: Unsteadiness on feet (R26.81);Other abnormalities of gait and mobility (R26.89);Muscle weakness (generalized) (M62.81);Other symptoms and signs involving the nervous system (R29.898);Other symptoms and signs involving cognitive function;Hemiplegia and hemiparesis Hemiplegia - Right/Left: Right Hemiplegia - dominant/non-dominant: Dominant Hemiplegia -  caused by: Cerebral infarction   Activity Tolerance Patient tolerated treatment well   Patient Left in chair;with call bell/phone within reach   Nurse Communication Mobility status        Time: 9099-9053 OT Time Calculation (min): 46 min  Charges: OT General Charges $OT Visit: 1 Visit OT Treatments $Self Care/Home Management : 23-37 mins $Therapeutic Activity: 8-22 mins  [name], OTA Acute Rehabilitation Services  Office [phone]   [name] 06/23/2024, 12:44 PM

## 2024-06-23 NOTE — Progress Notes (Signed)
 Progress Note    [name]  FMW:[id number] DOB: 20-Nov-1966  DOA: 03/04/2024 PCP: Tanda Bleacher, MD      Brief Narrative:    Medical records reviewed and are as summarized below:  [name] is a 58 y.o. male with medical history significant for COPD, cocaine abuse, presented to the ED 03/04/24 for slower speech, confusion and right-sided weakness, CT head showed left MCA infarct, CTA head and neck revealed an M2 occlusion on the left,   Echocardiogram revealed large mobile left ventricular thrombus on the left and right ventricular apical thrombus in the right, ejection fraction less than 20%.  Cardiology was consulted recommended DOAC.  PT evaluated patient and recommended skilled nursing facility placement.  Patient was found to lack medical decision capacity/status and not amenable to determining his discharge disposition or leave AGAINST MEDICAL ADVICE.  He intermittently declines physical exams and medications. TOC is pursuing guardianship with APS as he is lacking medical decision capacity. Psychiatry presently recommending to await APS decision for guardianship before discharging home.     Assessment/Plan:   Principal Problem:   Acute CVA (cerebrovascular accident) (HCC) Active Problems:   LV (left ventricular) mural thrombus   Chronic systolic CHF (congestive heart failure) (HCC)   Impaired decision making   Prolonged QT interval   Elevated liver enzymes   COPD (chronic obstructive pulmonary disease) (HCC)   Cocaine abuse (HCC)   Schizoaffective disorder, bipolar type (HCC)   Benign essential HTN   Acid reflux   History of DVT (deep vein thrombosis)   Type 2 diabetes mellitus (HCC)   Status post bilateral BKA.   Dilated cardiomyopathy (HCC)    Body mass index is 20.09 kg/m.   Acute ischemic CVA: -He underwent extensive workup and imaging revealed left MCA territory infarct, caudate nucleus, lentiform nucleus and internal capsule MRI confirmed. -TEE  was noted for large mobile left lower clot just under the mitral valve with an EF of 20% and a small right apical clot with reduced RV systolic function. -He was started on Eliquis  for intracardiac thrombus, however he remained noncompliant.  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 Psych recommending APS guardianship, TOC in touch with APS regarding this as patient lacks medical capacity   Essential hypertension:  on Coreg , Lasix , losartan , Aldactone .  Intermittent noncompliance   Hyperlipidemia: Continue Crestor .   Diabetes mellitus type 2:  last A1c of 5.4. refuses monitoring    Polysubstance abuse: UDS was positive for cocaine and benzos.   Physical Deconditioning: Ambulatory status complicated by previous right AKA and left BKA. - LLE prosthesis has been delivered.   History of COPD: Bronchodilators as needed   History of DVT: On Xarelto  but frequently refuses   Disposition: Pending court date for competency. Case management on board.   The indication for all his medications were discussed.  Benefits and potential risks were discussed.  The rationale for changing Eliquis  to  Xarelto  was also explained.  He is agreeable with the plan.         Diet Order             Diet regular Fluid consistency: Thin  Diet effective now            Diet - low sodium heart healthy                            Consultants: Cardiologist Psychiatrist Intensivist Neurologist  Procedures: None    Medications:    carvedilol   10 mg Oral Daily   furosemide   40 mg Oral Daily   gabapentin   200 mg Oral BID   lisinopril   40 mg Oral Daily   revefenacin   175 mcg Nebulization Daily   rivaroxaban   20 mg Oral Q supper   rosuvastatin   5 mg Oral QHS   spironolactone   25 mg Oral Daily   Continuous Infusions:   Anti-infectives (From admission, onward)    None              Family Communication/Anticipated D/C date and plan/Code Status   DVT prophylaxis:  rivaroxaban  (XARELTO ) tablet 20 mg     Code Status: Full Code  Family Communication: None Disposition Plan: To be determined.   Status is: Inpatient Remains inpatient appropriate because: No safe discharge plan.  Awaiting placement       Subjective:   Interval events noted.  He complained of frequent urination with Lasix  and spironolactone  and also reported lisinopril  may also be contributing to this.  His nurse was at the bedside  Objective:    Vitals:   06/19/24 1508 06/21/24 1940 06/22/24 1929 06/23/24 0755  BP:  (!) 117/97 (!) 134/103 100/76  Pulse: 87 84 79 76  Resp: 19 18 17 18   TempSrc: Oral Other (Comment) Other (Comment)   SpO2: 95% 100% 100%   Weight:      Height:       No data found.  No intake or output data in the 24 hours ending 06/23/24 1252 Filed Weights   03/26/24 0431 03/28/24 0456 04/04/24 0500  Weight: 42.7 kg 47.5 kg 61.7 kg    Exam:   GEN: NAD SKIN: Warm and dry EYES: No pallor or icterus ENT: MMM CV: RRR PULM: CTA B ABD: soft, ND, NT, +BS CNS: AAO x 3, non focal EXT: B/l leg amputee         Data Reviewed:   I have personally reviewed following labs and imaging studies:  Labs: Labs show the following:   Basic Metabolic Panel: No results for input(s): NA, K, CL, CO2, GLUCOSE,  BUN, CREATININE, CALCIUM , MG, PHOS in the last 168 hours. GFR CrCl cannot be calculated (Patient's most recent lab result is older than the maximum 21 days allowed.). Liver Function Tests: No results for input(s): AST, ALT, ALKPHOS, BILITOT, PROT, ALBUMIN in the last 168 hours. No results for input(s): LIPASE, AMYLASE in the last 168 hours. No results for input(s): AMMONIA in the last 168 hours. Coagulation profile No results for input(s): INR, PROTIME in the last 168 hours.  CBC: No results for input(s): WBC, NEUTROABS, HGB, HCT, MCV, PLT in the last 168 hours. Cardiac Enzymes: No results for input(s): CKTOTAL, CKMB, CKMBINDEX, TROPONINI in the last 168 hours. BNP (last 3 results) No results for input(s): PROBNP in the last 8760 hours. CBG: No results for input(s): GLUCAP in the last 168 hours. D-Dimer: No results for  input(s): DDIMER in the last 72 hours. Hgb A1c: No results for input(s): HGBA1C in the last 72 hours. Lipid Profile: No results for input(s): CHOL, HDL, LDLCALC, TRIG, CHOLHDL, LDLDIRECT in the last 72 hours. Thyroid  function studies: No results for input(s): TSH, T4TOTAL, T3FREE, THYROIDAB in the last 72 hours.  Invalid input(s): FREET3 Anemia work up: No results for input(s): VITAMINB12, FOLATE, FERRITIN, TIBC, IRON, RETICCTPCT in the last 72 hours. Sepsis Labs: No results for input(s): PROCALCITON, WBC, LATICACIDVEN in the last 168 hours.  Microbiology No results found for this or any previous visit (from the past 240 hours).  Procedures and diagnostic studies:  No results found.             LOS: 111 days   Trini Christiansen  [facility] [employer] loss adjuster on [email or web address]. If 7PM-7AM, please contact night-coverage at [email or web address]     06/23/2024, 12:52 PM

## 2024-06-23 NOTE — Plan of Care (Signed)
  Problem: Nutrition: Goal: Adequate nutrition will be maintained Outcome: Progressing   Problem: Coping: Goal: Level of anxiety will decrease Outcome: Progressing   Problem: Elimination: Goal: Will not experience complications related to bowel motility Outcome: Progressing Goal: Will not experience complications related to urinary retention Outcome: Progressing   Problem: Pain Managment: Goal: General experience of comfort will improve and/or be controlled Outcome: Progressing   Problem: Safety: Goal: Ability to remain free from injury will improve Outcome: Progressing   Problem: Skin Integrity: Goal: Risk for impaired skin integrity will decrease Outcome: Progressing

## 2024-06-23 NOTE — Progress Notes (Signed)
 Speech Language Pathology Treatment: Cognitive-Linguistic  Patient Details Name: [name] MRN: [id number] DOB: 05/12/1966 Today's Date: 06/23/2024 Time: 8741-8677 SLP Time Calculation (min) (ACUTE ONLY): 24 min  Assessment / Plan / Recommendation Clinical Impression  Pt again declined working on medication management tasks but was agreeable to working on Tree surgeon. Session today focused on menu reading task with pt reading questions out loud, using speech strategies with extra time and Mod I. While answering questions about the menu he required Min-Mod cues for selective attention in order to read the question accurately and not skip over any questions. With this cueing, he was able to answer almost all questions accurately, including ones that involved math calculations. Min verbal cues for problem solving were given for only one question. Discussed performance with pt and he is in agreement with recommendation to slow down for accuracy. SLP will continue to follow.    HPI HPI: Patient is 58 y.o. male presented to ED on 03/04/24 with stroke like symptoms when caregiver found him with slurred speech and right sided weakness. Head CT reveals Acute left MCA territory infarct affecting the insula, caudate nucleus, lentiform nucleus and internal capsule. Pt initially evaluated by SLP 4/5 but s/o 4/22 due to repeated refusals. SLP reordered 5/13 for repeat cognitive-linguistic eval.   PMH significant of  Schizoaffective disorder, heart failure with reduced ejection fraction, pretension, DMII, COPD, s/p bilateral amputations, chronic pain, and polysubstance abuse.      SLP Plan  Continue with current plan of care          Recommendations                         Intermittent Supervision/Assistance Cognitive communication deficit (M58.158)     Continue with current plan of care     Leita SAILOR., M.A. CCC-SLP Acute Rehabilitation Services Office: [phone]  Secure chat  preferred   06/23/2024, 1:39 PM

## 2024-06-24 DIAGNOSIS — I639 Cerebral infarction, unspecified: Secondary | ICD-10-CM | POA: Diagnosis not present

## 2024-06-24 NOTE — Progress Notes (Signed)
 Progress Note    [name]  FMW:[id number] DOB: 01-23-1966  DOA: 03/04/2024 PCP: Tanda Bleacher, MD      Brief Narrative:  Patient is a 58 year old male with medical history significant for COPD, cocaine abuse, presented to the ED 03/04/24 for slower speech, confusion and right-sided weakness, CT head showed left MCA infarct, CTA head and neck revealed an M2 occlusion on the left,   Echocardiogram revealed large mobile left ventricular thrombus on the left and right ventricular apical thrombus in the right, ejection fraction less than 20%.  Cardiology was consulted recommended DOAC.  PT evaluated patient and recommended skilled nursing facility placement.  Patient was found to lack medical decision capacity/status and not amenable to determining his discharge disposition or leave AGAINST MEDICAL ADVICE.  He intermittently declines physical exams and medications. TOC is pursuing guardianship with APS as he is lacking medical decision capacity. Psychiatry presently recommending to await APS decision for guardianship before discharging home.   06/24/2024: Patient seen.  No new complaints.  No new changes.  Assessment/Plan:   Principal Problem:   Acute CVA (cerebrovascular accident) [facility]) Active Problems:   Schizoaffective disorder, bipolar type (HCC)   Cocaine abuse (HCC)   Type 2 diabetes mellitus (HCC)   Prolonged QT interval   Acid reflux   History of DVT (deep vein thrombosis)   Status post bilateral BKA.   COPD (chronic obstructive pulmonary disease) (HCC)   Chronic systolic CHF (congestive heart failure) (HCC)   Benign essential HTN   LV (left ventricular) mural thrombus   Elevated liver enzymes   Dilated cardiomyopathy (HCC)   Impaired decision making    Body mass index is 20.09 kg/m.   Acute ischemic CVA: -He underwent extensive workup and imaging revealed left MCA territory infarct, caudate nucleus, lentiform nucleus and internal capsule MRI confirmed. -TEE was  noted for large mobile left lower clot just under the mitral valve with an EF of 20% and a small right apical clot with reduced RV systolic function. -He was started on Eliquis  for intracardiac thrombus, however he remained noncompliant.  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 Psych recommending APS guardianship, TOC in touch with APS regarding this as patient lacks medical capacity   Essential hypertension:  on Coreg , Lasix , losartan , Aldactone .  Intermittent noncompliance   Hyperlipidemia: Continue Crestor .   Diabetes mellitus type 2:  last A1c of 5.4. refuses monitoring    Polysubstance abuse: UDS was positive for cocaine and benzos.   Physical Deconditioning: Ambulatory status complicated by previous right AKA and left BKA. - LLE prosthesis has been delivered.   History of COPD: Bronchodilators as needed   History of DVT: On Xarelto  but frequently refuses   Disposition: Pending court date for competency. Case management on board.   Diet Order             Diet regular Fluid consistency: Thin  Diet effective now  Diet - low sodium heart healthy                   Consultants: Cardiologist Psychiatrist Intensivist Neurologist  Procedures: None    Medications:    carvedilol   10 mg Oral Daily   furosemide   40 mg  Oral Daily   gabapentin   200 mg Oral BID   lisinopril   40 mg Oral Daily   revefenacin   175 mcg Nebulization Daily   rivaroxaban   20 mg Oral Q supper   rosuvastatin   5 mg Oral QHS   spironolactone   25 mg Oral Daily   Continuous Infusions:   Anti-infectives (From admission, onward)    None       Family Communication/Anticipated D/C date and plan/Code Status   DVT prophylaxis:  rivaroxaban  (XARELTO ) tablet 20 mg     Code Status: Full Code  Family Communication: None Disposition Plan: To be determined.   Status is: Inpatient Remains inpatient appropriate because: No safe discharge plan.  Awaiting placement    Subjective:   No new complaints.  Objective:    Vitals:   06/22/24 1929 06/23/24 0755 06/24/24 1116 06/24/24 1500  BP:  100/76 134/80 (!) 146/104  Pulse: 79 76 66 82  Resp: 17 18 17 18   TempSrc: Other (Comment)     SpO2: 100%  100% 100%  Weight:      Height:       No data found.  No intake or output data in the 24 hours ending 06/24/24 1753 Filed Weights   03/26/24 0431 03/28/24 0456 04/04/24 0500  Weight: 42.7 kg 47.5 kg 61.7 kg    Exam:   GEN: Not in any distress.  Awake and alert. Neck: Supple. Lungs: Clear to auscultation. CVS: S1-S2. Neuro: Awake and alert. EXT: B/l leg amputee  Data Reviewed:   I have personally reviewed following labs and imaging studies:  Labs: Labs show the following:   Basic Metabolic Panel: No results for input(s): NA, K, CL, CO2, GLUCOSE, BUN, CREATININE, CALCIUM , MG, PHOS in the last 168 hours. GFR CrCl cannot be calculated (Patient's most recent lab result is older than the maximum 21 days allowed.). Liver Function Tests: No results for input(s): AST, ALT, ALKPHOS, BILITOT, PROT, ALBUMIN in the last 168 hours. No results for input(s): LIPASE, AMYLASE in the last 168 hours. No results for input(s): AMMONIA in the last 168 hours. Coagulation profile No results for  input(s): INR, PROTIME in the last 168 hours.  CBC: No results for input(s): WBC, NEUTROABS, HGB, HCT, MCV, PLT in the last 168 hours. Cardiac Enzymes: No results for input(s): CKTOTAL, CKMB, CKMBINDEX, TROPONINI in the last 168 hours. BNP (last 3 results) No results for input(s): PROBNP in the last 8760 hours. CBG: No results for input(s): GLUCAP in the last 168 hours. D-Dimer: No results for input(s): DDIMER in the last 72 hours. Hgb A1c: No results for input(s): HGBA1C in the last 72 hours. Lipid Profile: No results for input(s): CHOL, HDL, LDLCALC, TRIG, CHOLHDL, LDLDIRECT in the last 72 hours. Thyroid  function studies: No results for input(s): TSH, T4TOTAL, T3FREE, THYROIDAB in the last 72 hours.  Invalid input(s): FREET3 Anemia work up: No results for input(s): VITAMINB12, FOLATE, FERRITIN, TIBC, IRON, RETICCTPCT in the last 72 hours. Sepsis Labs: No results for input(s): PROCALCITON, WBC, LATICACIDVEN in the last 168 hours.  Microbiology No results found for this or any previous visit (from the past 240 hours).  Procedures and diagnostic studies:  No results found.  LOS: 112 days   Leatrice LILLETTE Chapel  [facility]   Pager on [email or web address]. If 7PM-7AM, please contact night-coverage at [email or web address]     06/24/2024, 5:53 PM

## 2024-06-24 NOTE — Plan of Care (Signed)
  Problem: Nutrition: Goal: Adequate nutrition will be maintained Outcome: Progressing   Problem: Coping: Goal: Level of anxiety will decrease Outcome: Progressing   Problem: Elimination: Goal: Will not experience complications related to bowel motility Outcome: Progressing Goal: Will not experience complications related to urinary retention Outcome: Progressing   Problem: Pain Managment: Goal: General experience of comfort will improve and/or be controlled Outcome: Progressing   Problem: Safety: Goal: Ability to remain free from injury will improve Outcome: Progressing   Problem: Skin Integrity: Goal: Risk for impaired skin integrity will decrease Outcome: Progressing

## 2024-06-24 NOTE — TOC Progression Note (Addendum)
 Transition of Care [facility]) - Progression Note    Patient Details  Name: [name] MRN: [id number] Date of Birth: November 15, 1966  Transition of Care [facility]) CM/SW Contact  [name], [name] Phone Number: 06/24/2024, 11:44 AM  Clinical Narrative:   CSW attempted to reach GAL, [name] [phone]), and left another voicemail to prepare for patient's court date on July 29. Awaiting call back. CSW met with patient to discuss, he has also not met with GAL or spoken with him yet. Patient concerned that he has not received his EBT card yet, he says it was supposed to have been delivered to the hospital. Patient knew the name of his worker at [employer] [facility]) but did not have a contact number. CSW to see if she can find anything out about his EBT card.  UPDATE: CSW received call back from [place], patient's GAL, he will be coming to evaluate the patient this evening. CSW compiled information on medical condition, ability with ADLs, and MD and psych notes documenting lack of capacity. Notes prepared and left for GAL when he visits. CSW notified patient and RN. CSW to follow.    Expected Discharge Plan: Skilled Nursing Facility Barriers to Discharge: English as a second language teacher, Active Substance Use - Placement, SNF Pending bed offer               Expected Discharge Plan and Services       Living arrangements for the past 2 months: Apartment Expected Discharge Date: 03/14/24                                     Social Drivers of Health (SDOH) Interventions SDOH Screenings   Food Insecurity: Food Insecurity Present (03/06/2024)  Housing: High Risk (03/06/2024)  Transportation Needs: No Transportation Needs (03/06/2024)  Utilities: Patient Declined (03/06/2024)  Alcohol Screen: Medium Risk (06/24/2019)  Depression (PHQ2-9): Low Risk  (02/18/2024)  Recent Concern: Depression (PHQ2-9) - Medium Risk (01/21/2024)  Financial Resource Strain: High Risk (07/24/2021)   Received from [facility]   Physical Activity: Inactive (02/18/2024)  Social Connections: Unknown (04/05/2022)   Received from [facility]  Stress: Stress Concern Present (07/24/2021)   Received from [facility]  Tobacco Use: High Risk (03/04/2024)  Health Literacy: Adequate Health Literacy (02/18/2024)    Readmission Risk Interventions    12/21/2023    9:25 AM 12/09/2022    9:02 AM 12/01/2022    3:56 PM  Readmission Risk Prevention Plan  Transportation Screening Complete Complete Complete  HRI or Home Care Consult Complete    Social Work Consult for Recovery Care Planning/Counseling Complete    Palliative Care Screening Not Applicable    Medication Review Oceanographer) Complete Complete Complete  PCP or Specialist appointment within 3-5 days of discharge  Complete Complete  HRI or Home Care Consult  Complete Complete  SW Recovery Care/Counseling Consult  Complete Complete  Palliative Care Screening  Not Applicable Not Applicable  Skilled Nursing Facility  Not Applicable Not Applicable  SNF Comments   n/a

## 2024-06-24 NOTE — Progress Notes (Signed)
 Chaplain visited with patient. SW Vertell present to deliver the news that [name]'s attorney will be visiting him this evening in preparation for Tuesday's hearing. [name] requests that I follow up with him on Sunday to debrief on how that conversation went.

## 2024-06-25 DIAGNOSIS — I639 Cerebral infarction, unspecified: Secondary | ICD-10-CM | POA: Diagnosis not present

## 2024-06-25 NOTE — Progress Notes (Signed)
 Progress Note    [name]  FMW:[id number] DOB: [birth year]  DOA: 03/04/2024 PCP: Tanda Bleacher, MD      Brief Narrative:  Patient is a 58 year old male with medical history significant for COPD, cocaine abuse, presented to the ED 03/04/24 for slower speech, confusion and right-sided weakness, CT head showed left MCA infarct, CTA head and neck revealed an M2 occlusion on the left,   Echocardiogram revealed large mobile left ventricular thrombus on the left and right ventricular apical thrombus in the right, ejection fraction less than 20%.  Cardiology was consulted recommended DOAC.  PT evaluated patient and recommended skilled nursing facility placement.  Patient was found to lack medical decision capacity/status and not amenable to determining his discharge disposition or leave AGAINST MEDICAL ADVICE.  He intermittently declines physical exams and medications. TOC is pursuing guardianship with APS as he is lacking medical decision capacity. Psychiatry presently recommending to await APS decision for guardianship before discharging home.   06/25/2024: Patient seen.  No new complaints.  No new changes.  Assessment/Plan:   Principal Problem:   Acute CVA (cerebrovascular accident) [facility]) Active Problems:   Schizoaffective disorder, bipolar type (HCC)   Cocaine abuse (HCC)   Type 2 diabetes mellitus (HCC)   Prolonged QT interval   Acid reflux   History of DVT (deep vein thrombosis)   Status post bilateral BKA.   COPD (chronic obstructive pulmonary disease) (HCC)   Chronic systolic CHF (congestive heart failure) (HCC)   Benign essential HTN   LV (left ventricular) mural thrombus   Elevated liver enzymes   Dilated cardiomyopathy (HCC)   Impaired decision making    Body mass index is 20.09 kg/m.   Acute ischemic CVA: -He underwent extensive workup and imaging revealed left MCA territory infarct, caudate nucleus, lentiform nucleus and internal capsule MRI confirmed. -TEE was  noted for large mobile left lower clot just under the mitral valve with an EF of 20% and a small right apical clot with reduced RV systolic function. -He was started on Eliquis  for intracardiac thrombus, however he remained noncompliant.  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 Psych recommending APS guardianship, TOC in touch with APS regarding this as patient lacks medical capacity   Essential hypertension:  on Coreg , Lasix , losartan , Aldactone .  Intermittent noncompliance   Hyperlipidemia: Continue Crestor .   Diabetes mellitus type 2:  last A1c of 5.4. refuses monitoring    Polysubstance abuse: UDS was positive for cocaine and benzos.   Physical Deconditioning: Ambulatory status complicated by previous right AKA and left BKA. - LLE prosthesis has been delivered.   History of COPD: Bronchodilators as needed   History of DVT: On Xarelto  but frequently refuses   Disposition: Pending court date for competency. Case management on board.   Diet Order             Diet regular Fluid consistency: Thin  Diet effective now  Diet - low sodium heart healthy                   Consultants: Cardiologist Psychiatrist Intensivist Neurologist  Procedures: None    Medications:    carvedilol   10 mg Oral Daily   furosemide   40 mg  Oral Daily   gabapentin   200 mg Oral BID   lisinopril   40 mg Oral Daily   revefenacin   175 mcg Nebulization Daily   rivaroxaban   20 mg Oral Q supper   rosuvastatin   5 mg Oral QHS   spironolactone   25 mg Oral Daily   Continuous Infusions:   Anti-infectives (From admission, onward)    None       Family Communication/Anticipated D/C date and plan/Code Status   DVT prophylaxis:  rivaroxaban  (XARELTO ) tablet 20 mg     Code Status: Full Code  Family Communication: None Disposition Plan: To be determined.   Status is: Inpatient Remains inpatient appropriate because: No safe discharge plan.  Awaiting placement    Subjective:   No new complaints.  Objective:    Vitals:   06/24/24 2024 06/25/24 0915 06/25/24 1224 06/25/24 1500  BP: (!) 151/85 (!) 134/103 130/89 127/78  Pulse: 75 69  81  Resp: 18 18 16 16   Temp:   98 F (36.7 C) 98.4 F (36.9 C)  TempSrc:    Oral  SpO2: 100% 98%  98%  Weight:      Height:       No data found.   Intake/Output Summary (Last 24 hours) at 06/25/2024 1701 Last data filed at 06/25/2024 0900 Gross per 24 hour  Intake --  Output 500 ml  Net -500 ml   Filed Weights   03/26/24 0431 03/28/24 0456 04/04/24 0500  Weight: 42.7 kg 47.5 kg 61.7 kg    Exam:   GEN: Not in any distress.  Awake and alert. Neck: Supple. Lungs: Clear to auscultation. CVS: S1-S2. Neuro: Awake and alert. EXT: B/l leg amputee  Data Reviewed:   I have personally reviewed following labs and imaging studies:  Labs: Labs show the following:   Basic Metabolic Panel: No results for input(s): NA, K, CL, CO2, GLUCOSE, BUN, CREATININE, CALCIUM , MG, PHOS in the last 168 hours. GFR CrCl cannot be calculated (Patient's most recent lab result is older than the maximum 21 days allowed.). Liver Function Tests: No results for input(s): AST, ALT, ALKPHOS, BILITOT, PROT, ALBUMIN in the last 168 hours. No results for input(s):  LIPASE, AMYLASE in the last 168 hours. No results for input(s): AMMONIA in the last 168 hours. Coagulation profile No results for input(s): INR, PROTIME in the last 168 hours.  CBC: No results for input(s): WBC, NEUTROABS, HGB, HCT, MCV, PLT in the last 168 hours. Cardiac Enzymes: No results for input(s): CKTOTAL, CKMB, CKMBINDEX, TROPONINI in the last 168 hours. BNP (last 3 results) No results for input(s): PROBNP in the last 8760 hours. CBG: No results for input(s): GLUCAP in the last 168 hours. D-Dimer: No results for input(s): DDIMER in the last 72 hours. Hgb A1c: No results for input(s): HGBA1C in the last 72 hours. Lipid Profile: No results for input(s): CHOL, HDL, LDLCALC, TRIG, CHOLHDL, LDLDIRECT in the last 72 hours. Thyroid  function studies: No results for input(s): TSH, T4TOTAL, T3FREE, THYROIDAB in the last 72 hours.  Invalid input(s): FREET3 Anemia work up: No results for input(s): VITAMINB12, FOLATE, FERRITIN, TIBC, IRON, RETICCTPCT in the last 72 hours. Sepsis Labs: No results for input(s): PROCALCITON, WBC, LATICACIDVEN  in the last 168 hours.  Microbiology No results found for this or any previous visit (from the past 240 hours).  Procedures and diagnostic studies:  No results found.    LOS: 113 days   Leatrice LILLETTE Chapel  [facility]   Pager on [email or web address]. If 7PM-7AM, please contact night-coverage at [email or web address]     06/25/2024, 5:01 PM

## 2024-06-26 DIAGNOSIS — I639 Cerebral infarction, unspecified: Secondary | ICD-10-CM | POA: Diagnosis not present

## 2024-06-26 MED ORDER — RIVAROXABAN 20 MG PO TABS
20.0000 mg | ORAL_TABLET | Freq: Every day | ORAL | Status: DC
  Administered 2024-06-26 – 2024-08-16 (×54): 20 mg via ORAL
  Filled 2024-06-26 (×51): qty 1

## 2024-06-26 MED ORDER — RIVAROXABAN 20 MG PO TABS: 20.0000 mg | ORAL_TABLET | Freq: Every day | ORAL | Status: DC

## 2024-06-26 NOTE — Progress Notes (Signed)
   06/26/24 1815  Spiritual Encounters  Type of Visit Follow up  Care provided to: Patient  Reason for visit Routine spiritual support  OnCall Visit Yes  Interventions  Spiritual Care Interventions Made Compassionate presence;Reflective listening;Decision-making support/facilitation;Prayer;Encouragement

## 2024-06-26 NOTE — Progress Notes (Signed)
 Progress Note    [name]  FMW:[id number] DOB: 1966/09/14  DOA: 03/04/2024 PCP: Tanda Bleacher, MD      Brief Narrative:  Patient is a 58 year old male with medical history significant for COPD, cocaine abuse, presented to the ED 03/04/24 for slower speech, confusion and right-sided weakness, CT head showed left MCA infarct, CTA head and neck revealed an M2 occlusion on the left,   Echocardiogram revealed large mobile left ventricular thrombus on the left and right ventricular apical thrombus in the right, ejection fraction less than 20%.  Cardiology was consulted recommended DOAC.  PT evaluated patient and recommended skilled nursing facility placement.  Patient was found to lack medical decision capacity/status and not amenable to determining his discharge disposition or leave AGAINST MEDICAL ADVICE.  He intermittently declines physical exams and medications. TOC is pursuing guardianship with APS as he is lacking medical decision capacity. Psychiatry presently recommending to await APS decision for guardianship before discharging home.   06/26/2024: Patient seen.  No new complaints.  No new changes.  Assessment/Plan:   Principal Problem:   Acute CVA (cerebrovascular accident) [facility]) Active Problems:   Schizoaffective disorder, bipolar type (HCC)   Cocaine abuse (HCC)   Type 2 diabetes mellitus (HCC)   Prolonged QT interval   Acid reflux   History of DVT (deep vein thrombosis)   Status post bilateral BKA.   COPD (chronic obstructive pulmonary disease) (HCC)   Chronic systolic CHF (congestive heart failure) (HCC)   Benign essential HTN   LV (left ventricular) mural thrombus   Elevated liver enzymes   Dilated cardiomyopathy (HCC)   Impaired decision making    Body mass index is 20.09 kg/m.   Acute ischemic CVA: -He underwent extensive workup and imaging revealed left MCA territory infarct, caudate nucleus, lentiform nucleus and internal capsule MRI confirmed. -TEE was  noted for large mobile left lower clot just under the mitral valve with an EF of 20% and a small right apical clot with reduced RV systolic function. -He was started on Eliquis  for intracardiac thrombus, however he remained noncompliant.  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 Psych recommending APS guardianship, TOC in touch with APS regarding this as patient lacks medical capacity   Essential hypertension:  on Coreg , Lasix , losartan , Aldactone .  Intermittent noncompliance   Hyperlipidemia: Continue Crestor .   Diabetes mellitus type 2:  last A1c of 5.4. refuses monitoring    Polysubstance abuse: UDS was positive for cocaine and benzos.   Physical Deconditioning: Ambulatory status complicated by previous right AKA and left BKA. - LLE prosthesis has been delivered.   History of COPD: Bronchodilators as needed   History of DVT: On Xarelto  but frequently refuses   Disposition: Pending court date for competency. Case management on board.   Diet Order             Diet regular Fluid consistency: Thin  Diet effective now  Diet - low sodium heart healthy                   Consultants: Cardiologist Psychiatrist Intensivist Neurologist  Procedures: None    Medications:    carvedilol   10 mg Oral Daily   furosemide   40 mg  Oral Daily   gabapentin   200 mg Oral BID   lisinopril   40 mg Oral Daily   revefenacin   175 mcg Nebulization Daily   rivaroxaban   20 mg Oral Q supper   rosuvastatin   5 mg Oral QHS   spironolactone   25 mg Oral Daily   Continuous Infusions:   Anti-infectives (From admission, onward)    None       Family Communication/Anticipated D/C date and plan/Code Status   DVT prophylaxis:  rivaroxaban  (XARELTO ) tablet 20 mg     Code Status: Full Code  Family Communication: None Disposition Plan: To be determined.   Status is: Inpatient Remains inpatient appropriate because: No safe discharge plan.  Awaiting placement    Subjective:   No new complaints.  Objective:    Vitals:   06/25/24 0915 06/25/24 1224 06/25/24 1500 06/26/24 1300  BP: (!) 134/103 130/89 127/78 (!) 124/90  Pulse: 69  81 68  Resp: 18 16 16 16   Temp:  98 F (36.7 C) 98.4 F (36.9 C)   TempSrc:   Oral   SpO2: 98%  98% 96%  Weight:      Height:       No data found.  No intake or output data in the 24 hours ending 06/26/24 1356  Filed Weights   03/26/24 0431 03/28/24 0456 04/04/24 0500  Weight: 42.7 kg 47.5 kg 61.7 kg    Exam:   GEN: Not in any distress.  Awake and alert. Neck: Supple. Lungs: Clear to auscultation. CVS: S1-S2. Neuro: Awake and alert. EXT: B/l leg amputee  Data Reviewed:   I have personally reviewed following labs and imaging studies:  Labs: Labs show the following:   Basic Metabolic Panel: No results for input(s): NA, K, CL, CO2, GLUCOSE, BUN, CREATININE, CALCIUM , MG, PHOS in the last 168 hours. GFR CrCl cannot be calculated (Patient's most recent lab result is older than the maximum 21 days allowed.). Liver Function Tests: No results for input(s): AST, ALT, ALKPHOS, BILITOT, PROT, ALBUMIN in the last 168 hours. No results for input(s): LIPASE, AMYLASE in the last 168 hours. No results for input(s): AMMONIA in the last 168  hours. Coagulation profile No results for input(s): INR, PROTIME in the last 168 hours.  CBC: No results for input(s): WBC, NEUTROABS, HGB, HCT, MCV, PLT in the last 168 hours. Cardiac Enzymes: No results for input(s): CKTOTAL, CKMB, CKMBINDEX, TROPONINI in the last 168 hours. BNP (last 3 results) No results for input(s): PROBNP in the last 8760 hours. CBG: No results for input(s): GLUCAP in the last 168 hours. D-Dimer: No results for input(s): DDIMER in the last 72 hours. Hgb A1c: No results for input(s): HGBA1C in the last 72 hours. Lipid Profile: No results for input(s): CHOL, HDL, LDLCALC, TRIG, CHOLHDL, LDLDIRECT in the last 72 hours. Thyroid  function studies: No results for input(s): TSH, T4TOTAL, T3FREE, THYROIDAB in the last 72 hours.  Invalid input(s): FREET3 Anemia work up: No results for input(s): VITAMINB12, FOLATE, FERRITIN, TIBC, IRON, RETICCTPCT in the last 72 hours. Sepsis Labs: No results for input(s): PROCALCITON, WBC, LATICACIDVEN in the last 168 hours.  Microbiology No results found for this or any previous visit (from the past  240 hours).  Procedures and diagnostic studies:  No results found.    LOS: 114 days   Leatrice LILLETTE Chapel  [facility]   Pager on [email or web address]. If 7PM-7AM, please contact night-coverage at [email or web address]     06/26/2024, 1:56 PM

## 2024-06-27 DIAGNOSIS — I639 Cerebral infarction, unspecified: Secondary | ICD-10-CM | POA: Diagnosis not present

## 2024-06-27 NOTE — Progress Notes (Signed)
 Physical Therapy Treatment Patient Details Name: [name] MRN: [id number] DOB: Apr 04, 1966 Today's Date: 06/27/2024   History of Present Illness Patient is 58 y.o. male presented to ED on 03/04/24 with stroke like symptoms when caregiver found him with slurred speech and right sided weakness. Head CT reveals Acute left MCA territory infarct affecting the insula, caudate nucleus, lentiform nucleus and internal capsule. PMH significant of  Schizoaffective disorder, heart failure with reduced ejection fraction, pretension, DMII, COPD, s/p bilateral amputations, chronic pain, and polysubstance abuse.    PT Comments  Continuing work on functional mobility and activity tolerance;  Session focused on prosthesis use, with specific attention to LLE pain when standing on the porsthesis; we added 1 ply sock to see if that will help with pressure redistribution with somewhat equivocal, but a little bit promising results; [name] was able to stand with  more upright posture, and stand from a lower surface; Reached out to [employer] to see about possibility of re-casting his RLE for a prosthesis; not sure if that's possible, but it's worth inquiry   If plan is discharge home, recommend the following: Supervision due to cognitive status;Direct supervision/assist for financial management;Direct supervision/assist for medications management;Assist for transportation   Can travel by private vehicle      (perhaps soon)  Equipment Recommendations  None recommended by PT    Recommendations for Other Services OT consult;Speech consult     Precautions / Restrictions Precautions Precautions: Fall Recall of Precautions/Restrictions: Impaired Precaution/Restrictions Comments: h/o BLE amputations Required Braces or Orthoses: Other Brace Other Brace: L prosthesis Restrictions Weight Bearing Restrictions Per Provider Order: No     Mobility  Bed Mobility               General bed mobility  comments: Pt in w/c on arrival    Transfers Overall transfer level: Needs assistance Equipment used: None, Rolling walker (2 wheels) Transfers: Sit to/from Stand Sit to Stand: Mod assist           General transfer comment: Stood with mod assist from wheelchair to RW with L prosthesis on; Heaviloy dependent on UEs, and needed assist to stablize RW as he tended to start with UEs on RW; able to stand more upright this session than previous session; Held stance position for approx 5 seconds    Ambulation/Gait                   Psychologist, counselling mobility: Yes Wheelchair propulsion: Both upper extremities Wheelchair parts: Independent Distance: 150 Wheelchair Assistance Details (indicate cue type and reason): Noteworthy because pt had his L prosthesis on, and held the foot up off the floor the entire time   Tilt Bed    Modified Rankin (Stroke Patients Only) Modified Rankin (Stroke Patients Only) Pre-Morbid Rankin Score: Slight disability Modified Rankin: Moderate disability     Balance                                            Communication Communication Communication: Other (comment) (word finding difficulties)  Cognition Arousal: Alert Behavior During Therapy: WFL for tasks assessed/performed   PT - Cognitive impairments: Memory, Problem solving                       PT - Cognition  Comments: Initiated donning prosthesis liner inside out, self-corrected when end of limb came in contact with pin; mod assist to start correctly Following commands: Intact Following commands impaired: Follows one step commands inconsistently, Follows one step commands with increased time    Cueing Cueing Techniques: Verbal cues, Visual cues  Exercises      General Comments General comments (skin integrity, edema, etc.): Left a message at [employer] to speak with his CPO re:  possibility of casting RLE for prosthesis socket while here at hospital      Pertinent Vitals/Pain Pain Assessment Pain Assessment: Faces Faces Pain Scale: Hurts a little bit Pain Location: L residual limb during standing trials Pain Descriptors / Indicators: Grimacing Pain Intervention(s): Monitored during session (and tried to assess if using another ply of sock helps to redistribute pressure for more tolerance)    Home Living                          Prior Function            PT Goals (current goals can now be found in the care plan section) Acute Rehab PT Goals Patient Stated Goal: Agreeable to working on prosthesis training PT Goal Formulation: With patient Time For Goal Achievement: 07/12/24 Potential to Achieve Goals: Fair Progress towards PT goals: Progressing toward goals    Frequency    Min 2X/week      PT Plan      Co-evaluation              AM-PAC PT 6 Clicks Mobility   Outcome Measure  Help needed turning from your back to your side while in a flat bed without using bedrails?: None Help needed moving from lying on your back to sitting on the side of a flat bed without using bedrails?: None Help needed moving to and from a bed to a chair (including a wheelchair)?: A Little Help needed standing up from a chair using your arms (e.g., wheelchair or bedside chair)?: Total Help needed to walk in hospital room?: Total Help needed climbing 3-5 steps with a railing? : Total 6 Click Score: 14    End of Session   Activity Tolerance: Patient tolerated treatment well Patient left: Other (comment) (rolling with wheelchair in hallway) Nurse Communication: Mobility status PT Visit Diagnosis: Other abnormalities of gait and mobility (R26.89);Muscle weakness (generalized) (M62.81);Other symptoms and signs involving the nervous system (R29.898);Hemiplegia and hemiparesis Hemiplegia - Right/Left: Right Hemiplegia - dominant/non-dominant:  Dominant Hemiplegia - caused by: Cerebral infarction     Time: 1411-1440 PT Time Calculation (min) (ACUTE ONLY): 29 min  Charges:    $Therapeutic Activity: 23-37 mins PT General Charges $$ ACUTE PT VISIT: 1 Visit                     [name], PT  Acute Rehabilitation Services Office [phone] Secure Chat welcomed    [name] 06/27/2024, 4:21 PM

## 2024-06-27 NOTE — Progress Notes (Signed)
 Progress Note    Auther Lyerly  FMW:[id number] DOB: November 24, 1966  DOA: 03/04/2024 PCP: Tanda Bleacher, MD      Brief Narrative:  Patient is a 58 year old male with medical history significant for COPD, cocaine abuse, presented to the ED 03/04/24 for slower speech, confusion and right-sided weakness, CT head showed left MCA infarct, CTA head and neck revealed an M2 occlusion on the left,   Echocardiogram revealed large mobile left ventricular thrombus on the left and right ventricular apical thrombus in the right, ejection fraction less than 20%.  Cardiology was consulted recommended DOAC.  PT evaluated patient and recommended skilled nursing facility placement.  Patient was found to lack medical decision capacity/status and not amenable to determining his discharge disposition or leave AGAINST MEDICAL ADVICE.  He intermittently declines physical exams and medications. TOC is pursuing guardianship with APS as he is lacking medical decision capacity. Psychiatry presently recommending to await APS decision for guardianship before discharging home.   06/27/2024: Patient seen.  No new complaints.  No new changes.  Assessment/Plan:   Principal Problem:   Acute CVA (cerebrovascular accident) [facility]) Active Problems:   Schizoaffective disorder, bipolar type (HCC)   Cocaine abuse (HCC)   Type 2 diabetes mellitus (HCC)   Prolonged QT interval   Acid reflux   History of DVT (deep vein thrombosis)   Status post bilateral BKA.   COPD (chronic obstructive pulmonary disease) (HCC)   Chronic systolic CHF (congestive heart failure) (HCC)   Benign essential HTN   LV (left ventricular) mural thrombus   Elevated liver enzymes   Dilated cardiomyopathy (HCC)   Impaired decision making    Body mass index is 20.09 kg/m.   Acute ischemic CVA: -He underwent extensive workup and imaging revealed left MCA territory infarct, caudate nucleus, lentiform nucleus and internal capsule MRI confirmed. -TEE was  noted for large mobile left lower clot just under the mitral valve with an EF of 20% and a small right apical clot with reduced RV systolic function. -He was started on Eliquis  for intracardiac thrombus, however he remained noncompliant.  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 Psych recommending APS guardianship, TOC in touch with APS regarding this as patient lacks medical capacity   Essential hypertension:  on Coreg , Lasix , losartan , Aldactone .  Intermittent noncompliance   Hyperlipidemia: Continue Crestor .   Diabetes mellitus type 2:  last A1c of 5.4. refuses monitoring    Polysubstance abuse: UDS was positive for cocaine and benzos.   Physical Deconditioning: Ambulatory status complicated by previous right AKA and left BKA. - LLE prosthesis has been delivered.   History of COPD: Bronchodilators as needed   History of DVT: On Xarelto  but frequently refuses   Disposition: Pending court date for competency. Case management on board.   Diet Order             Diet regular Fluid consistency: Thin  Diet effective now  Diet - low sodium heart healthy                   Consultants: Cardiologist Psychiatrist Intensivist Neurologist  Procedures: None    Medications:    carvedilol   10 mg Oral Daily   furosemide   40 mg  Oral Daily   gabapentin   200 mg Oral BID   lisinopril   40 mg Oral Daily   revefenacin   175 mcg Nebulization Daily   rivaroxaban   20 mg Oral QHS   rosuvastatin   5 mg Oral QHS   spironolactone   25 mg Oral Daily   Continuous Infusions:   Anti-infectives (From admission, onward)    None       Family Communication/Anticipated D/C date and plan/Code Status   DVT prophylaxis:  rivaroxaban  (XARELTO ) tablet 20 mg     Code Status: Full Code  Family Communication: None Disposition Plan: To be determined.   Status is: Inpatient Remains inpatient appropriate because: No safe discharge plan.  Awaiting placement    Subjective:   No new complaints.  Objective:    Vitals:   06/26/24 1631 06/26/24 2006 06/27/24 1227 06/27/24 1645  BP: 138/80 111/79 (!) 121/98 124/69  Pulse: 86 83 65 69  Resp: 16 18 16 16   TempSrc:      SpO2: 98% 100% 100% 92%  Weight:      Height:       No data found.   Intake/Output Summary (Last 24 hours) at 06/27/2024 1901 Last data filed at 06/27/2024 0900 Gross per 24 hour  Intake --  Output 350 ml  Net -350 ml    Filed Weights   03/26/24 0431 03/28/24 0456 04/04/24 0500  Weight: 42.7 kg 47.5 kg 61.7 kg    Exam:   GEN: Not in any distress.  Awake and alert. Neck: Supple. Lungs: Clear to auscultation. CVS: S1-S2. Neuro: Awake and alert. EXT: B/l leg amputee  Data Reviewed:   I have personally reviewed following labs and imaging studies:  Labs: Labs show the following:   Basic Metabolic Panel: No results for input(s): NA, K, CL, CO2, GLUCOSE, BUN, CREATININE, CALCIUM , MG, PHOS in the last 168 hours. GFR CrCl cannot be calculated (Patient's most recent lab result is older than the maximum 21 days allowed.). Liver Function Tests: No results for input(s): AST, ALT, ALKPHOS, BILITOT, PROT, ALBUMIN in the last 168 hours. No results for input(s): LIPASE, AMYLASE in the last 168 hours. No results  for input(s): AMMONIA in the last 168 hours. Coagulation profile No results for input(s): INR, PROTIME in the last 168 hours.  CBC: No results for input(s): WBC, NEUTROABS, HGB, HCT, MCV, PLT in the last 168 hours. Cardiac Enzymes: No results for input(s): CKTOTAL, CKMB, CKMBINDEX, TROPONINI in the last 168 hours. BNP (last 3 results) No results for input(s): PROBNP in the last 8760 hours. CBG: No results for input(s): GLUCAP in the last 168 hours. D-Dimer: No results for input(s): DDIMER in the last 72 hours. Hgb A1c: No results for input(s): HGBA1C in the last 72 hours. Lipid Profile: No results for input(s): CHOL, HDL, LDLCALC, TRIG, CHOLHDL, LDLDIRECT in the last 72 hours. Thyroid  function studies: No results for input(s): TSH, T4TOTAL, T3FREE, THYROIDAB in the last 72 hours.  Invalid input(s): FREET3 Anemia work up: No results for input(s): VITAMINB12, FOLATE, FERRITIN, TIBC, IRON, RETICCTPCT in the last 72 hours. Sepsis Labs: No results for input(s): PROCALCITON, WBC, LATICACIDVEN in the last 168 hours.  Microbiology No results found for this or  any previous visit (from the past 240 hours).  Procedures and diagnostic studies:  No results found.    LOS: 115 days   Leatrice LILLETTE Chapel  [facility]   Pager on [email or web address]. If 7PM-7AM, please contact night-coverage at [email or web address]     06/27/2024, 7:01 PM

## 2024-06-28 DIAGNOSIS — I639 Cerebral infarction, unspecified: Secondary | ICD-10-CM | POA: Diagnosis not present

## 2024-06-28 NOTE — Plan of Care (Signed)
  Problem: Nutrition: Goal: Adequate nutrition will be maintained Outcome: Progressing   Problem: Coping: Goal: Level of anxiety will decrease Outcome: Progressing   Problem: Elimination: Goal: Will not experience complications related to bowel motility Outcome: Progressing Goal: Will not experience complications related to urinary retention Outcome: Progressing   Problem: Pain Managment: Goal: General experience of comfort will improve and/or be controlled Outcome: Progressing   Problem: Safety: Goal: Ability to remain free from injury will improve Outcome: Progressing   Problem: Skin Integrity: Goal: Risk for impaired skin integrity will decrease Outcome: Progressing

## 2024-06-28 NOTE — TOC Progression Note (Signed)
 Transition of Care [facility]) - Progression Note    Patient Details  Name: [name] MRN: [id number] Date of Birth: January 12, 1966  [facility]) CM/SW Contact  [name], [name] Phone Number: 06/28/2024, 3:09 PM  Clinical Narrative:   CSW contacted by patient's GAL, [name], to discuss that he met with the patient on Friday evening and he does not believe that patient lacks competency, that he can complete a POA document and not need a guardian. [name] indicated that he was trying to get up to the hospital again this evening to meet with patient and caregiver, [name], to complete POA paperwork. Per [name], CSW would not be required for the POA documentation, but CSW received a call from patient's caregiver, [name], that she had been waiting for [facility] for an hour and he hadn't shown up. [name] indicated that CSW was supposed to be present. CSW nor [name] were able to wait for [name] any longer. CSW attempted to reach [place] to discuss, left a voicemail. Will try again to reach [facility] tomorrow to ask about POA and court hearing.    Expected Discharge Plan: Skilled Nursing Facility Barriers to Discharge: English as a second language teacher, Active Substance Use - Placement, SNF Pending bed offer               Expected Discharge Plan and Services       Living arrangements for the past 2 months: Apartment Expected Discharge Date: 03/14/24                                     Social Drivers of Health (SDOH) Interventions SDOH Screenings   Food Insecurity: Food Insecurity Present (03/06/2024)  Housing: High Risk (03/06/2024)  Transportation Needs: No Transportation Needs (03/06/2024)  Utilities: Patient Declined (03/06/2024)  Alcohol Screen: Medium Risk (06/24/2019)  Depression (PHQ2-9): Low Risk  (02/18/2024)  Recent Concern: Depression (PHQ2-9) - Medium Risk (01/21/2024)  Financial Resource Strain: High Risk (07/24/2021)   Received from [facility]  Physical Activity: Inactive (02/18/2024)  Social  Connections: Unknown (04/05/2022)   Received from [facility]  Stress: Stress Concern Present (07/24/2021)   Received from [facility]  Tobacco Use: High Risk (03/04/2024)  Health Literacy: Adequate Health Literacy (02/18/2024)    Readmission Risk Interventions    12/21/2023    9:25 AM 12/09/2022    9:02 AM 12/01/2022    3:56 PM  Readmission Risk Prevention Plan  Transportation Screening Complete Complete Complete  HRI or Home Care Consult Complete    Social Work Consult for Recovery Care Planning/Counseling Complete    Palliative Care Screening Not Applicable    Medication Review Oceanographer) Complete Complete Complete  PCP or Specialist appointment within 3-5 days of discharge  Complete Complete  HRI or Home Care Consult  Complete Complete  SW Recovery Care/Counseling Consult  Complete Complete  Palliative Care Screening  Not Applicable Not Applicable  Skilled Nursing Facility  Not Applicable Not Applicable  SNF Comments   n/a

## 2024-06-28 NOTE — Progress Notes (Signed)
 PROGRESS NOTE  [name]  FMW:[id number] DOB: August 21, 1966 DOA: 03/04/2024 PCP: Tanda Bleacher, MD  Consultants  Brief Narrative: Patient is a 58 year old male with medical history significant for COPD, cocaine abuse, presented to the ED 03/04/24 for slower speech, confusion and right-sided weakness, CT head showed left MCA infarct, CTA head and neck revealed an M2 occlusion on the left,   Echocardiogram revealed large mobile left ventricular thrombus on the left and right ventricular apical thrombus in the right, ejection fraction less than 20%.  Cardiology was consulted recommended DOAC.  PT evaluated patient and recommended skilled nursing facility placement.  Patient was found to lack medical decision capacity/status and not amenable to determining his discharge disposition or leave AGAINST MEDICAL ADVICE. TOC is pursuing guardianship with APS as he is lacking medical decision capacity. Psychiatry presently recommending to await APS decision for guardianship before discharging home.   7/29:  New update -- pt's attorney pushing for re-eval by psych.    Assessment & Plan:   Acute ischemic CVA: -He underwent extensive workup and imaging revealed left MCA territory infarct, caudate nucleus, lentiform nucleus and internal capsule MRI confirmed. -TEE was noted for large mobile left lower clot just under the mitral valve with an EF of 20% and a small right apical clot with reduced RV systolic function. -He was started on Eliquis  for intracardiac thrombus, however he remained noncompliant.  In order to improve compliance, medicine changed to Xarelto  for once daily dosing -Psychiatry has deemed the patient to lack capacity; he continues to remain noncompliant at times-->today he held a very coherent conversation with me.  Could explain his reasoning for wanting to leave the hospital and what might happen if he did leave. - Based on this plan will be to reconsult psychiatry for further evaluation.  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 Psych recommending APS guardianship, TOC in touch with APS regarding this as patient lacks medical capacity Last Psych eval in May of this year.  Will re-consult.     Essential hypertension:  on Coreg , Lasix , losartan , Aldactone .  Intermittent noncompliance   Hyperlipidemia: Continue Crestor .   Diabetes mellitus type 2:  last A1c of 5.4. refuses monitoring    Polysubstance abuse: UDS was positive for cocaine and benzos.   Physical Deconditioning: Ambulatory status complicated by previous right AKA and left BKA. - LLE prosthesis has been delivered.   History of COPD: Bronchodilators as needed   History of DVT: On Xarelto  but frequently refuses   Disposition: Pending court date for competency. Case management on board.  Psychiatry reconsulted today.     DVT prophylaxis:   rivaroxaban  (XARELTO ) tablet 20 mg  Code Status:   Code Status: Full Code Level of care: Med-Surg Status is: Inpatient   Consults called: Psychiatry  Subjective: No complaints today.  Obviously wants to get out of the hospital.  Interested in seeing psychiatry again.  Objective: Vitals:   06/27/24 2208 06/27/24 2352 06/27/24 2354 06/28/24 1001  BP: (!) 114/57 (!) 147/111 100/63 (!) 149/91  Pulse:  65 68 (!) 59  Resp:      TempSrc:      SpO2:  98%  100%  Weight:      Height:       No intake or output data in the 24 hours ending 06/28/24 1637 Filed Weights   03/26/24 0431  03/28/24 0456 04/04/24 0500  Weight: 42.7 kg 47.5 kg 61.7 kg   Body mass index is 20.09 kg/m.  GEN: Not in any distress.  Awake and alert. Neck: Supple. Lungs: Clear to auscultation. CVS: S1-S2. Neuro: Awake and alert. EXT: B/l leg amputee  Scheduled Meds:  carvedilol   10 mg Oral Daily   furosemide   40 mg Oral Daily   gabapentin   200 mg Oral BID   lisinopril   40 mg Oral Daily   revefenacin   175 mcg Nebulization Daily   rivaroxaban   20 mg Oral QHS   rosuvastatin   5 mg Oral QHS   spironolactone   25 mg Oral Daily   Continuous Infusions:   LOS: 116 days   35 minutes with more than 50% spent in reviewing records, counseling patient/family and coordinating care.  [name] VEAR Gaw, MD [facility] [email or web address] 06/28/2024, 4:37 PM

## 2024-06-28 NOTE — TOC Progression Note (Signed)
 Transition of Care [facility]) - Progression Note    Patient Details  Name: [name] MRN: [id number] Date of Birth: Apr 26, 1966  [facility]) CM/SW Contact  [name], [name] Phone Number: 06/28/2024, 3:56 PM  Clinical Narrative:   CSW spoke with patient's GAL, [name], and caregiver, [name], this morning. Patient's hearing was supposed to be scheduled for today at 28, it has been continued until 9/8. However, per [name], he is moving forward with having a POA completed and his position is that we do not need to wait for the hearing for [name] to make decisions regarding discharge plans.  CSW spoke with [facility] Supervisor about updates with court, and suggested reconsulting psychiatry and MD about patient's capacity. If Mds agree that patient has capacity at this time, then we can move forward with discharge planning; if Mds do not agree, then we need to wait for the court hearing to proceed. CSW contacted MD to update, awaiting evaluation on patient's capacity.   CSW met with patient to discuss and answer questions. Patient understandably frustrated that court has been continued again but happy that he can make his own decisions for now and that we can make progress on disposition. Patient does not agree with needing 24/7 supervision and wants to live on his own again, asking what options would be available with [name]. CSW answered questions, and said she'd reach out to [facility]. CSW sent patient's care team with [name] an email to provide update and ask about what options [name] would have available for housing, awaiting response.    Expected Discharge Plan: Skilled Nursing Facility Barriers to Discharge: English as a second language teacher, Active Substance Use - Placement, SNF Pending bed offer               Expected Discharge Plan and Services       Living arrangements for the past 2 months: Apartment Expected Discharge Date: 03/14/24                                      Social Drivers of Health (SDOH) Interventions SDOH Screenings   Food Insecurity: Food Insecurity Present (03/06/2024)  Housing: High Risk (03/06/2024)  Transportation Needs: No Transportation Needs (03/06/2024)  Utilities: Patient Declined (03/06/2024)  Alcohol Screen: Medium Risk (06/24/2019)  Depression (PHQ2-9): Low Risk  (02/18/2024)  Recent Concern: Depression (PHQ2-9) - Medium Risk (01/21/2024)  Financial Resource Strain: High Risk (07/24/2021)   Received from [facility]  Physical Activity: Inactive (02/18/2024)  Social Connections: Unknown (04/05/2022)   Received from [facility]  Stress: Stress Concern Present (07/24/2021)   Received from [facility]  Tobacco Use: High Risk (03/04/2024)  Health Literacy: Adequate Health Literacy (02/18/2024)    Readmission Risk Interventions    12/21/2023    9:25 AM 12/09/2022    9:02 AM 12/01/2022    3:56 PM  Readmission Risk Prevention Plan  Transportation Screening Complete Complete Complete  HRI or Home Care Consult Complete    Social Work Consult for Recovery Care Planning/Counseling Complete    Palliative Care Screening Not Applicable    Medication Review Oceanographer) Complete Complete Complete  PCP or Specialist appointment within 3-5 days of discharge  Complete Complete  HRI or Home Care Consult  Complete Complete  SW Recovery Care/Counseling Consult  Complete Complete  Palliative Care Screening  Not Applicable Not Applicable  Skilled Nursing Facility  Not Applicable Not Applicable  SNF Comments  n/a

## 2024-06-29 ENCOUNTER — Inpatient Hospital Stay (HOSPITAL_COMMUNITY): Payer: MEDICAID

## 2024-06-29 DIAGNOSIS — I639 Cerebral infarction, unspecified: Secondary | ICD-10-CM | POA: Diagnosis not present

## 2024-06-29 DIAGNOSIS — Z789 Other specified health status: Secondary | ICD-10-CM | POA: Diagnosis not present

## 2024-06-29 DIAGNOSIS — I513 Intracardiac thrombosis, not elsewhere classified: Secondary | ICD-10-CM | POA: Diagnosis not present

## 2024-06-29 DIAGNOSIS — I5022 Chronic systolic (congestive) heart failure: Secondary | ICD-10-CM | POA: Diagnosis not present

## 2024-06-29 MED ORDER — SUMATRIPTAN SUCCINATE 25 MG PO TABS
25.0000 mg | ORAL_TABLET | Freq: Once | ORAL | Status: AC
  Administered 2024-06-29: 25 mg via ORAL
  Filled 2024-06-29: qty 1

## 2024-06-29 MED ORDER — PROCHLORPERAZINE MALEATE 10 MG PO TABS
10.0000 mg | ORAL_TABLET | Freq: Once | ORAL | Status: AC
  Administered 2024-06-29: 10 mg via ORAL
  Filled 2024-06-29: qty 1

## 2024-06-29 MED ORDER — DIPHENHYDRAMINE HCL 25 MG PO CAPS
25.0000 mg | ORAL_CAPSULE | Freq: Once | ORAL | Status: AC
  Administered 2024-06-29: 25 mg via ORAL
  Filled 2024-06-29: qty 1

## 2024-06-29 NOTE — Consult Note (Addendum)
 [facility] Psychiatric Consult Initial  Patient Name: .Random Dobrowski  MRN: [id number]  DOB: 1966/10/16  Consult Order details:  Orders (From admission, onward)     Start     Ordered   06/28/24 1642  IP CONSULT TO PSYCHIATRY       Ordering Provider: Elpidio Reyes DEL, MD  Provider:  (Not yet assigned)  Question Answer Comment  Location [facility]   Reason for Consult? Question about improved capacity now that patient has been receiving treatment in house.  Patient's attorney requested repeat psych consult for possible discharge planning      06/28/24 1642   04/05/24 1343  IP CONSULT TO PSYCHIATRY       Ordering Provider: Judeth Trenda BIRCH, MD  Provider:  (Not yet assigned)  Question Answer Comment  Location [facility]   Reason for Consult? Acute stroke, embolic from large intracardiac clots, refusing Eliquis .  Schizoaffective d/o, lacks MDM capacity, has consistently refused to take Latuda .  ?  parenteral/IM, possibly long-acting [facility] medications.      04/05/24 1346   03/30/24 1323  IP CONSULT TO PSYCHIATRY       Comments: 88M, lived alone, B/L amputee, wheelchair, P/W acute left MCA stroke, large cardiac thrombus, through course of hospital admission with issues of agitation, refusal of care, refusing SNF, felt unsafe to DC home and documentation regarding lack of capacity (however did not specifically see this addressed in multiple psychiatry evaluations), currently medically stable awaiting disposition. APS contacted.  Kindly refer to [facility] MDs note from 4/30 regarding need for capacity evaluation.  Ordering Provider: Judeth Trenda BIRCH, MD  Provider:  (Not yet assigned)  Question Answer Comment  Location [facility]   Reason for Consult? Kindly assist with capacity evaluation> details in comments.      03/30/24 1323   03/14/24 1515  IP CONSULT TO PSYCHIATRY       Ordering Provider: Elpidio Reyes DEL, MD  Provider:  (Not yet  assigned)  Question Answer Comment  Location [facility]   Reason for Consult? Capacity evaluation -- patient verbally abusive, wants to go home, but oriented only to self.  Lives alone, seems to be unable to care for himself at home.  Essentially refusing all cares here      03/14/24 1514             Mode of Visit: In person    Psychiatry Consult Evaluation  Service Date: June 29, 2024 LOS:  LOS: 117 days  Chief Complaint   Primary Psychiatric Diagnoses  Hx Schizoaffective disorder unspecified versus hx Substance induced psychosis/mood disorder versus hx MDD 2.  Stimulant use disorder (cocaine), in controlled environment  Assessment  Kwaku Mostafa is a 58 y.o. male admitted: Medicallyfor 03/04/2024  2:31 PM for residual stroke with prolonged hospitalization due to inability to care for self. He carries the psychiatric diagnoses of stimulant use disorder and ?schizoaffective disorder bipolar type?, several suicide attempts by overdose/unspecified and has a past medical history of residual R side weakness and aphasia from L MCA stroke in April 2024 and chronic cardiomyopathy.   Pt hospitalized in April 2025 for large stroke resulting in residual deficits including R sided weakness, aphasia, and general cognitive impairment.  Psychiatry had deemed pt to lack capacity on last assessment, and then the primary team contacted APS for competency evaluation. Currently, pt's guardian ad litum, [name], is requesting primary team to re-consult psych for capacity re-evaluation given improvements in  pt's cognition and does not believe pt lacks competency (per 7/28 [name] note).  Capacity question is unclear but appears to be a question of whether patient can leave the hospital and care for himself.  This is of course independent of competency evaluation which must be done by judge in [place]  and cannot be done by medical providers or guardian ad litem.  On assessment, patient  has intact orientation to person place and time, attention that is fair as evident by conversation and not by testing as patient declined these, and good short-term and good long-term memory as evident by recalling 3 of 3 words in 5 minutes and remembering details from months ago.  Cognitively this appears to be improvement with patient compared to documentation earlier in hospitalization.  However, patient is not able to consistently answer questions around discharge planning including where he would live, what he would do in the event of certain medical situations, and how he would take care of self.  Seems that safe discharge for patient would involve having housing in place, close guardian and ACTT follow up, transport/coordination for appointment medically, and likely home health support.  This is not an assessment of patient's capacity (as patient is having competency evaluation that override any capacity discussions) but instead is objective assessment of patient cognition and ability to answer questions around discharge planning and assessment of some of the needs of patient for safe discharge.  The bigger issue at hand is patient was deemed to having lack of capacity and then was deemed to possibly lack competency for which evaluation was initiated and, in our opinion, would overrides any capacity discussions until competency is assessed by court. Consulted ethics, Beverley Sans, and discussed case with her. Beverley agrees, given the current open investigation and pending court date for competency evaluation, that we cannot discharge patient regardless of our capacity assessment. See ethics note for details. Discussed that patient's current mentation could be phenomena of waxing and waning and may not be true representation of patient's functioning.  Beverley agreed that patient should remain in the hospital until competency evaluation and also agrees that our team cannot provide re-evaluation that alone would be  grounds for discharging patient.  This was discussed with the primary team and [name].  [name] requested that psychiatry reach out to guardian ad litem, [name], to discuss the situation.  Psychiatrically, pt has unclear past psychiatric history of primary psychotic disorder and is not currently on any psychiatric medications and has not been for several months.  He denies any symptoms psychosis including hallucinations or paranoia.  It appears that patient less likely has a primary psychotic disorder and may have been substance induced given lack of symptoms off medication and in controlled setting away from cocaine.  Would recommend continuing to avoid substances.  Would recommend patient continue with ACT team.    Diagnoses:  Active Hospital problems: Principal Problem:   Acute CVA (cerebrovascular accident) [facility]) Active Problems:   Schizoaffective disorder, bipolar type (HCC)   Cocaine abuse (HCC)   Type 2 diabetes mellitus (HCC)   Prolonged QT interval   Acid reflux   History of DVT (deep vein thrombosis)   Status post bilateral BKA.   COPD (chronic obstructive pulmonary disease) (HCC)   Chronic systolic CHF (congestive heart failure) (HCC)   Benign essential HTN   LV (left ventricular) mural thrombus   Elevated liver enzymes   Dilated cardiomyopathy (HCC)   Impaired decision making    Plan   ##  Psychiatric Medication Recommendations:  No indication for psychiatric meds currently given likely substance induced psychosis previously.  If psychiatric symptoms return, would recommend starting medication at that time.  ## Medical Decision Making Capacity: will not make an assessment on capacity in the setting of having pending competency evaluation by court, scheduled for Sept.   ## Further Work-up:  -- given long QT -> optimize K 4.0, Mg 2.0, and benefit-risk with all QT prolonging meds -- most recent EKG on 06/29/2024 had QtC of 511  -- Pertinent labwork reviewed earlier this  admission   ## Disposition:-- There are no psychiatric contraindications to discharge at this time. Discharge is pending competency evaluation or other legally appropriate means of discharge. Please contact ethics team if discharge is planned prior to competency evaluation.   ## Behavioral / Environmental: - No specific recommendations at this time.     ## Safety and Observation Level:  - Based on my clinical evaluation, I estimate the patient to be at low risk of self harm in the current setting. - At this time, we recommend  routine. This decision is based on my review of the chart including patient's history and current presentation, interview of the patient, mental status examination, and consideration of suicide risk including evaluating suicidal ideation, plan, intent, suicidal or self-harm behaviors, risk factors, and protective factors. This judgment is based on our ability to directly address suicide risk, implement suicide prevention strategies, and develop a safety plan while the patient is in the clinical setting. Please contact our team if there is a concern that risk level has changed.  CSSR Risk Category:C-SSRS RISK CATEGORY: No Risk  Suicide Risk Assessment: Patient has following modifiable risk factors for suicide: medication noncompliance, which we are addressing by competency evaluation to get pt guardian and access to resources. Patient has following non-modifiable or demographic risk factors for suicide: male gender, history of suicide attempt, and psychiatric hospitalization Patient has the following protective factors against suicide: in controlled environment and not currently experiencing any acute psychiatric concerns.   Thank you for this consult request. Recommendations have been communicated to the primary team.  We will communicate with guardian ad litum to close loop and sign off after this conversation.   Justino Cornish, MD PGY-2 Psychiatry Resident 06/29/2024,  1:53 PM     History of Present Illness  Relevant Aspects of [facility] Course:  Admitted on 03/04/2024 for strokelike symptoms.  Patient was diagnosed with left MCA territory stroke which involve numerous structures.  Patient had residual deficits including right-sided weakness and cognitive impairment.  Patient had been on treatment for the last several months and per primary team was making some improvements.  Patient had been deemed incompetent by hospital team and was asked by court to be evaluated for competency.  Guardian ad litem reported that felt patient had capacity which prompted current evaluation.  Per primary team patient appears to be at or near his medical baseline.  Patient Report:  When asking patient what he would do if he were to hypothetically leave the hospital today, he reports he does not know.  When asking him where he would stay, he report that he knows people but on further questioning reports that he does not know and says that he would talk to [facility].  Patient reports that he had given his key to [name], nurse with ACT team, and currently is unaware if he has access to his apartment still.  Patient reports that he is followed by [facility] and they frequently follow him to check on his mental health and other aspects of his health care.  Asked patient if he will continue to use cocaine after discharge and he adamantly reports no.  Asked patient what he would do if he had any medical concerns, and he reported he would call out for help.  Following this discussion, patient reports that he could stay at a halfway house and therapy people there for him to get help from if he has any medical concerns.  Talked to patient about ADLs, and he reports that he is able to feed himself, use the bathroom by himself, and shower himself.  He then reports I cannot do everything myself.  When discussing who has helped in the past patient reports [name], [facility].  Asked patient if he had any family members or friends that he speaks with, and he reports he does not.  Asked patient if he could make the decision of leaving today and taking care of himself or waiting in the hospital until court evaluation and getting resources to help him function including group home, and he is unable to provide a meaningful answer for this.  Psych ROS:  Depression: Does not describe depressive symptoms.  Denies suicidal thoughts. Anxiety: Did not describe significant anxiety symptoms. Mania (lifetime and current): Does not describe recent manic symptoms. Psychosis: (lifetime and current): Endorses some symptoms of psychosis outside of cocaine use in the past but denies any symptoms psychosis since hospitalization in April and being off of secondary medications and not using cocaine.  Collateral information:  [name], guardian ad litum, [phone]: Call and left HIPAA compliant voicemail.   Psychiatric and Social History  Psychiatric History:  Information collected from patient and chart  Prev Dx/Sx: Schizoaffective disorder Current Psych Provider: ACT team Home Meds (current): Abilify  5 mg twice a day and hydroxyzine  25 mg every 6 hours as needed Previous Med Trials: Multiple trials Therapy: Currently followed up ACT team   Prior Psych Hospitalization: Last hospitalization was at [name] in October 2024 Prior Self Harm: History of suicidal behavior Prior Violence: History of agitation and carrying knife/scapel   Family Psych History: Unknown Family Hx suicide: Unknown  Social History:  Not discussed during this encounter.  Not much information per chart review.  Patient did appear to live at her apartment alone prior to current admission.  Patient is followed by ACT team. Access to weapons/lethal means: Denies  Substance History Cocaine use disorder and hx of rehab. Also history of alc use but w/o complications. No other pertinent substance hx.    Exam Findings  Physical Exam:  Vital Signs:  Pulse Rate:  [60-75] 66 (07/29 2326) Resp:  [16-18] 16 (07/29 2246) BP: (136-150)/(71-76) 143/72 (07/29 2326) SpO2:  [92 %-98 %] 98 % (07/29 2326) Blood pressure (!) 143/72, pulse 66, temperature 98.4 F (36.9 C), temperature source Oral, resp. rate 16, height 5' 9 (1.753 m), weight 61.7 kg, SpO2 98%. Body mass index is 20.09 kg/m.  Physical Exam Vitals and nursing note reviewed.  HENT:     Head: Normocephalic and atraumatic.  Pulmonary:     Effort: Pulmonary effort is normal.  Musculoskeletal:     Comments: Bilateral lower extremity amputations.  Neurological:     General: No focal deficit present.     Mental Status: He is alert.     Mental Status Exam: General Appearance: Casual and Fairly Groomed. Looks appropriate to age. Laying comfortably across the bed. Wearing casual  clothing.   Orientation:  Full (Time, Place, and Person)  Memory:  Recent;   Good Remote;   Fair As evident by recalling 3 out of 3 objects in 5 minutes.  Remote intact as evident by remembering interviewer 4 months ago including her feel to practice and specifics about conversation at that time.  Concentration:  Concentration: Good  Recall:  Good  Attention  Fair, unable to formally test as patient declined.  Per conversation appears at least fair.  Eye Contact:  Good  Speech:  Normal Rate and Slurred. Frequent stuttering.  Language:  Fair  Volume:  Normal  Mood: Did not formally assess.  Affect:  Appropriate and Full Range.   Thought Process:  Coherent and Irrelevant.  Linear thought process.  Does have some irrelevant content when asked direct questions.  Thought Content: Logical.  No evidence of hallucinations.  No evidence of delusional or paranoid thoughts.  Suicidal Thoughts:  No  Homicidal Thoughts:  No  Judgement:  Poor  Insight:  Lacking  Psychomotor Activity:  Normal  Akathisia:  Negative  Fund of Knowledge:  Fair, lower end of normal.  Unclear if chronic or since his CVA.       Assets:  Communication Skills Desire for Improvement Financial Resources/Insurance Leisure Time  Cognition: On brief assessment intact executive functioning.  ADL's:  Intact, subjective per patient; SNF per OT  AIMS (if indicated):        Other History   These have been pulled in through the EMR, reviewed, and updated if appropriate.  Family History:  The patient's family history includes Drug abuse in his mother; Suicidality in his mother.  Medical History: Past Medical History:  Diagnosis Date   Asthma    Bipolar 1 disorder (HCC)    CHF (congestive heart failure) (HCC)    COPD (chronic obstructive pulmonary disease) (HCC)    Depression    Diabetes mellitus without complication (HCC)    GERD (gastroesophageal reflux disease)    Homelessness    Hypertension    QT prolongation    Schizoaffective disorder (HCC)     Surgical History: Past Surgical History:  Procedure Laterality Date   AMPUTATION Left 12/10/2022   Procedure: LEFT BELOW KNEE AMPUTATION;  Surgeon: Harden Jerona GAILS, MD;  Location: [facility] OR;  Service: Orthopedics;  Laterality: Left;   AMPUTATION TOE Bilateral 01/02/2021   Procedure: AMPUTATION TOE;  Surgeon: Gretel Ozell PARAS, DPM;  Location: [facility] OR;  Service: Podiatry;  Laterality: Bilateral;  Ok to leave patient on hospital bed   IR ANGIO INTRA EXTRACRAN SEL COM CAROTID INNOMINATE BILAT MOD SED  12/17/2020   IR ANGIO VERTEBRAL SEL VERTEBRAL BILAT MOD SED  12/17/2020   MULTIPLE EXTRACTIONS WITH ALVEOLOPLASTY N/A 03/25/2023   Procedure: IRRIGATION AND DEBRIDEMENT, MULTIPLE EXTRACTION WITH ALVEOLOPLASTY X 4 , EXTRACTIONS 4 THROUGH 13, 20 THROUGHT 29;  Surgeon: Joanette Soulier, DMD;  Location: [facility] OR;  Service: Oral Surgery;  Laterality: N/A;   RADIOLOGY WITH ANESTHESIA N/A 12/17/2020   Procedure: IR WITH ANESTHESIA;  Surgeon: Radiologist, Medication, MD;  Location: [facility] OR;  Service: Radiology;  Laterality: N/A;     Medications:    Current Facility-Administered Medications:    albuterol  (PROVENTIL ) (2.5 MG/3ML) 0.083% nebulizer solution 2.5 mg, 2.5 mg, Nebulization, Q6H PRN, Danton Reyes DASEN, MD, 2.5 mg at 06/05/24 0840   carvedilol  (COREG  CR) 24 hr capsule 10 mg, 10 mg, Oral, Daily, Lenon Pons L, MD, 10 mg at 06/29/24 1043   feeding supplement (ENSURE ENLIVE / ENSURE  PLUS) liquid 237 mL, 237 mL, Oral, BID BM PRN, Danton Reyes DASEN, MD   furosemide  (LASIX ) tablet 40 mg, 40 mg, Oral, Daily, [place], Hao, [place], 40 mg at 06/29/24 1043   gabapentin  (NEURONTIN ) capsule 200 mg, 200 mg, Oral, BID, Jacquetta, Jamison Y, NP, 200 mg at 06/29/24 1043   hydrALAZINE  (APRESOLINE ) tablet 25 mg, 25 mg, Oral, Q6H PRN, Sundil, Subrina, MD   labetalol  (NORMODYNE ) injection 20 mg, 20 mg, Intravenous, Q3H PRN, Mansy, Jan A, MD, 20 mg at 05/24/24 2250   lactulose  (CHRONULAC ) 10 GM/15ML solution 20 g, 20 g, Oral, BID PRN, Akula, Vijaya, MD   lisinopril  (ZESTRIL ) tablet 40 mg, 40 mg, Oral, Daily, Feliz Ortiz, Abraham, MD, 40 mg at 06/29/24 1043   Oral care mouth rinse, 15 mL, Mouth Rinse, PRN, Regalado, Belkys A, MD   oxyCODONE  (Oxy IR/ROXICODONE ) immediate release tablet 10 mg, 10 mg, Oral, Q4H PRN, Danton Reyes T, MD, 10 mg at 06/29/24 1043   revefenacin  (YUPELRI ) nebulizer solution 175 mcg, 175 mcg, Nebulization, Daily, Desai, Nikita S, MD, 175 mcg at 06/08/24 0820   rivaroxaban  (XARELTO ) tablet 20 mg, 20 mg, Oral, QHS, Ogbata, Sylvester I, MD, 20 mg at 06/28/24 2135   rosuvastatin  (CRESTOR ) tablet 5 mg, 5 mg, Oral, QHS, Desai, Nikita S, MD, 5 mg at 06/28/24 2135   spironolactone  (ALDACTONE ) tablet 25 mg, 25 mg, Oral, Daily, Pahwani, Ravi, MD, 25 mg at 06/29/24 1044  Allergies: Allergies  Allergen Reactions   Acetaminophen  Hives, Nausea And Vomiting, Rash and Anaphylaxis    PT states that if he takes this he is going to the hospital   Motrin  [Ibuprofen ] Anaphylaxis   Nsaids Anaphylaxis    PT states that if he takes this he is going  to the hospital   Lyrica  Cr [Pregabalin  Er] Other (See Comments)    Dystonia Dysarthria    Peanut-Containing Drug Products Other (See Comments)    Pt denies allergy to peanut butter 03/02/2021    Justino Cornish, MD  I personally was present and performed or re-performed the history, physical exam and medical decision-making activities of this service and have verified that the service and findings are accurately documented in the student's note.  Justino Cornish, MD PGY-2 Psychiatry Resident  [place] JINNY Pereyra, MS3 contributed to interview and documentation of cognitive status.  06/29/2024, 1:53 PM

## 2024-06-29 NOTE — Consult Note (Shared)
 [facility] Psychiatric Consult Initial  Patient Name: .[name]  MRN: [id number]  DOB: 03-09-1966  Consult Order details:  Orders (From admission, onward)     Start     Ordered   06/28/24 1642  IP CONSULT TO PSYCHIATRY       Ordering Provider: Elpidio Reyes DEL, MD  Provider:  (Not yet assigned)  Question Answer Comment  Location [facility]   Reason for Consult? Question about improved capacity now that patient has been receiving treatment in house.  Patient's attorney requested repeat psych consult for possible discharge planning      06/28/24 1642   04/05/24 1343  IP CONSULT TO PSYCHIATRY       Ordering Provider: Judeth Trenda BIRCH, MD  Provider:  (Not yet assigned)  Question Answer Comment  Location [facility]   Reason for Consult? Acute stroke, embolic from large intracardiac clots, refusing Eliquis .  Schizoaffective d/o, lacks MDM capacity, has consistently refused to take Latuda .  ?  parenteral/IM, possibly long-acting BHH medications.      04/05/24 1346   03/30/24 1323  IP CONSULT TO PSYCHIATRY       Comments: 23M, lived alone, B/L amputee, wheelchair, P/W acute left MCA stroke, large cardiac thrombus, through course of hospital admission with issues of agitation, refusal of care, refusing SNF, felt unsafe to DC home and documentation regarding lack of capacity (however did not specifically see this addressed in multiple psychiatry evaluations), currently medically stable awaiting disposition. APS contacted.  Kindly refer to [facility] MDs note from 4/30 regarding need for capacity evaluation.  Ordering Provider: Judeth Trenda BIRCH, MD  Provider:  (Not yet assigned)  Question Answer Comment  [facility]   Reason for Consult? Kindly assist with capacity evaluation> details in comments.      03/30/24 1323   03/14/24 1515  IP CONSULT TO PSYCHIATRY       Ordering Provider: Elpidio Reyes DEL, MD  Provider:  (Not yet  assigned)  Question Answer Comment  Location [facility]   Reason for Consult? Capacity evaluation -- patient verbally abusive, wants to go home, but oriented only to self.  Lives alone, seems to be unable to care for himself at home.  Essentially refusing all cares here      03/14/24 1514             Mode of Visit: In person    Psychiatry Consult Evaluation  Service Date: June 29, 2024 LOS:  LOS: 117 days  Chief Complaint   Primary Psychiatric Diagnoses  Schizoaffective disorder unspecified versus hx Substance induced psychosis/mood disorder versus hx MDD 2.  Stimulant use disorder (cocaine), in controlled environment  Assessment  [name] is a 58 y.o. male admitted: Medicallyfor 03/04/2024  2:31 PM for residual stroke with prolonged hospitalization due to inability to care for self. He carries the psychiatric diagnoses of cocaine use disorder and ?schizoaffective disorder bipolar type?, several suicide attempts by overdose/unspecified and has a past medical history of residual R side weakness and aphasia from L MCA stroke and cardiomyopathy.   Pt hospitalized in April 2025 for large stroke resulting in residual deficits including R sided weakness, aphasia, and general cognitive impairment.  Primary team / psychiatry had deemed pt lacking capacity and recommended APS for competency evaluation and guardianship. Currently, pt's lawyer, [name],  is requesting primary team to re-consult psych for capacity re-evaluation given improvements in pt's cognition and does not believe pt lacks competency (  per 7/28 [name] note). Additionally, pt has unclear past psychiatric history of primary psychotic disorder and is not currently on any psychiatric medications and has well documented hx of cocaine use.   ***  Diagnoses:  Active Hospital problems: Principal Problem:   Acute CVA (cerebrovascular accident) [facility]) Active Problems:   Schizoaffective disorder, bipolar type  (HCC)   Cocaine abuse (HCC)   Type 2 diabetes mellitus (HCC)   Prolonged QT interval   Acid reflux   History of DVT (deep vein thrombosis)   Status post bilateral BKA.   COPD (chronic obstructive pulmonary disease) (HCC)   Chronic systolic CHF (congestive heart failure) (HCC)   Benign essential HTN   LV (left ventricular) mural thrombus   Elevated liver enzymes   Dilated cardiomyopathy (HCC)   Impaired decision making    Plan   ## Psychiatric Medication Recommendations:  ***  ## Medical Decision Making Capacity: {CHL BH MEDICAL DECISION MAKING CAPACITY:31818}  ## Further Work-up:  -- *** {CHLmacgeneralandspecificworkuprecs:31821} -- most recent EKG on *** had QtC of *** -- Pertinent labwork reviewed earlier this admission includes: ***   ## Disposition:-- {CHLmaccldispo:31820}  ## Behavioral / Environmental: -{CHLmacbehavioralenvironmental2:31847}    ## Safety and Observation Level:  - Based on my clinical evaluation, I estimate the patient to be at *** risk of self harm in the current setting. - At this time, we recommend  {CHL BH SUICIDE OBSERVATION LEVEL:31850}. This decision is based on my review of the chart including patient's history and current presentation, interview of the patient, mental status examination, and consideration of suicide risk including evaluating suicidal ideation, plan, intent, suicidal or self-harm behaviors, risk factors, and protective factors. This judgment is based on our ability to directly address suicide risk, implement suicide prevention strategies, and develop a safety plan while the patient is in the clinical setting. Please contact our team if there is a concern that risk level has changed.  CSSR Risk Category:C-SSRS RISK CATEGORY: No Risk  Suicide Risk Assessment: Patient has following modifiable risk factors for suicide: {CHLmacmodifiablesuicideriskfactors:31822}, which we are addressing by ***. Patient has following non-modifiable  or demographic risk factors for suicide: {CHLmacnonmodifiablesuicideriskfactors:31823} Patient has the following protective factors against suicide: {CHLmacprotectivefactors:31824}  Thank you for this consult request. Recommendations have been communicated to the primary team.  We will *** at this time.   Justino Cornish, MD       History of Present Illness  Relevant Aspects of [facility] or ED course:31819} Course:  Admitted on 03/04/2024 for ***. They ***.   Patient Report:  ***  Psych ROS:  Depression: *** Anxiety:  *** Mania (lifetime and current): *** Psychosis: (lifetime and current): ***  Collateral information:  Contacted *** at *** on ***  ROS   Psychiatric and Social History  Psychiatric History:  Information collected from patient and chart  Prev Dx/Sx: Schizoaffective disorder Current Psych Provider: ACT team Home Meds (current): Abilify  5 mg twice a day and hydroxyzine  25 mg every 6 hours as needed Previous Med Trials: Multiple trials Therapy: Currently followed up ACT team   Prior Psych Hospitalization: Last hospitalization was at [facility] in October 2024 Prior Self Harm: History of suicidal behavior Prior Violence: History of agitation and carrying knife/scapel   Family Psych History: Unknown Family Hx suicide: Unknown  Social History:  Developmental Hx: *** Educational Hx: *** Occupational Hx: *** Legal Hx: *** Living Situation: pt was living alone in apartment Spiritual Hx: *** Access to weapons/lethal means: ***   Substance History  Cocaine use disorder and hx of rehab. Also history of alc use but w/o complications. No other pertinent substance hx.   Exam Findings  Physical Exam: *** Vital Signs:  Pulse Rate:  [59-75] 66 (07/29 2326) Resp:  [16-18] 16 (07/29 2246) BP: (136-150)/(71-91) 143/72 (07/29 2326) SpO2:  [92 %-100 %] 98 % (07/29 2326) Blood pressure (!) 143/72, pulse 66, temperature 98.4 F (36.9 C), temperature source  Oral, resp. rate 16, height 5' 9 (1.753 m), weight 61.7 kg, SpO2 98%. Body mass index is 20.09 kg/m.  Physical Exam  Mental Status Exam: General Appearance: {Appearance:22683}  Orientation:  {BHH ORIENTATION (PAA):22689}  Memory:  {BHH MEMORY:22881}  Concentration:  {Concentration:21399}  Recall:  {BHH GOOD/FAIR/POOR:22877}  Attention  {BH Attention Span:31825}  Eye Contact:  {BHH EYE CONTACT:22684}  Speech:  {Speech:22685}  Language:  {BHH GOOD/FAIR/POOR:22877}  Volume:  {Volume (PAA):22686}  Mood: ***  Affect:  {Affect (PAA):22687}  Thought Process:  {Thought Process (PAA):22688}  Thought Content:  {Thought Content:22690}  Suicidal Thoughts:  {ST/HT (PAA):22692}  Homicidal Thoughts:  {ST/HT (PAA):22692}  Judgement:  {Judgement (PAA):22694}  Insight:  {Insight (PAA):22695}  Psychomotor Activity:  {Psychomotor (PAA):22696}  Akathisia:  {BHH YES OR NO:22294}  Fund of Knowledge:  {BHH GOOD/FAIR/POOR:22877}      Assets:  {Assets (PAA):22698}  Cognition:  {chl bhh cognition:304700322}  ADL's:  {BHH JIO'D:77709}  AIMS (if indicated):        Other History   These have been pulled in through the EMR, reviewed, and updated if appropriate.  Family History:  The patient's family history includes Drug abuse in his mother; Suicidality in his mother.  Medical History: Past Medical History:  Diagnosis Date   Asthma    Bipolar 1 disorder (HCC)    CHF (congestive heart failure) (HCC)    COPD (chronic obstructive pulmonary disease) (HCC)    Depression    Diabetes mellitus without complication (HCC)    GERD (gastroesophageal reflux disease)    Homelessness    Hypertension    QT prolongation    Schizoaffective disorder (HCC)     Surgical History: Past Surgical History:  Procedure Laterality Date   AMPUTATION Left 12/10/2022   Procedure: LEFT BELOW KNEE AMPUTATION;  Surgeon: Harden Jerona GAILS, MD;  Location: [facility] OR;  Service: Orthopedics;  Laterality: Left;   AMPUTATION TOE  Bilateral 01/02/2021   Procedure: AMPUTATION TOE;  Surgeon: Gretel Ozell PARAS, DPM;  Location: [facility] OR;  Service: Podiatry;  Laterality: Bilateral;  Ok to leave patient on hospital bed   IR ANGIO INTRA EXTRACRAN SEL COM CAROTID INNOMINATE BILAT MOD SED  12/17/2020   IR ANGIO VERTEBRAL SEL VERTEBRAL BILAT MOD SED  12/17/2020   MULTIPLE EXTRACTIONS WITH ALVEOLOPLASTY N/A 03/25/2023   Procedure: IRRIGATION AND DEBRIDEMENT, MULTIPLE EXTRACTION WITH ALVEOLOPLASTY X 4 , EXTRACTIONS 4 THROUGH 13, 20 THROUGHT 29;  Surgeon: Joanette Soulier, DMD;  Location: [facility] OR;  Service: Oral Surgery;  Laterality: N/A;   RADIOLOGY WITH ANESTHESIA N/A 12/17/2020   Procedure: IR WITH ANESTHESIA;  Surgeon: Radiologist, Medication, MD;  Location: [facility] OR;  Service: Radiology;  Laterality: N/A;     Medications:   Current Facility-Administered Medications:    albuterol  (PROVENTIL ) (2.5 MG/3ML) 0.083% nebulizer solution 2.5 mg, 2.5 mg, Nebulization, Q6H PRN, Danton Reyes DASEN, MD, 2.5 mg at 06/05/24 0840   carvedilol  (COREG  CR) 24 hr capsule 10 mg, 10 mg, Oral, Daily, Lenon Pons L, MD, 10 mg at 06/28/24 0957   feeding supplement (ENSURE ENLIVE / ENSURE PLUS) liquid 237 mL, 237  mL, Oral, BID BM PRN, Danton Reyes DASEN, MD   furosemide  (LASIX ) tablet 40 mg, 40 mg, Oral, Daily, Parker, Hao, [place], 40 mg at 06/28/24 9042   gabapentin  (NEURONTIN ) capsule 200 mg, 200 mg, Oral, BID, Jacquetta, Jamison Y, NP, 200 mg at 06/28/24 2135   hydrALAZINE  (APRESOLINE ) tablet 25 mg, 25 mg, Oral, Q6H PRN, Sundil, Subrina, MD   labetalol  (NORMODYNE ) injection 20 mg, 20 mg, Intravenous, Q3H PRN, Mansy, Jan A, MD, 20 mg at 05/24/24 2250   lactulose  (CHRONULAC ) 10 GM/15ML solution 20 g, 20 g, Oral, BID PRN, Akula, Vijaya, MD   lisinopril  (ZESTRIL ) tablet 40 mg, 40 mg, Oral, Daily, Feliz Ortiz, Abraham, MD, 40 mg at 06/28/24 0957   Oral care mouth rinse, 15 mL, Mouth Rinse, PRN, Regalado, Belkys A, MD   oxyCODONE  (Oxy IR/ROXICODONE ) immediate release tablet 10  mg, 10 mg, Oral, Q4H PRN, Danton Reyes DASEN, MD, 10 mg at 06/29/24 0359   revefenacin  (YUPELRI ) nebulizer solution 175 mcg, 175 mcg, Nebulization, Daily, Desai, Nikita S, MD, 175 mcg at 06/08/24 0820   rivaroxaban  (XARELTO ) tablet 20 mg, 20 mg, Oral, QHS, Ogbata, Sylvester I, MD, 20 mg at 06/28/24 2135   rosuvastatin  (CRESTOR ) tablet 5 mg, 5 mg, Oral, QHS, Desai, Nikita S, MD, 5 mg at 06/28/24 2135   spironolactone  (ALDACTONE ) tablet 25 mg, 25 mg, Oral, Daily, Vernon Ranks, MD, 25 mg at 06/28/24 0957  Allergies: Allergies  Allergen Reactions   Acetaminophen  Hives, Nausea And Vomiting, Rash and Anaphylaxis    PT states that if he takes this he is going to the hospital   Motrin  [Ibuprofen ] Anaphylaxis   Nsaids Anaphylaxis    PT states that if he takes this he is going to the hospital   Lyrica  Cr [Pregabalin  Er] Other (See Comments)    Dystonia Dysarthria    Peanut-Containing Drug Products Other (See Comments)    Pt denies allergy to peanut butter 03/02/2021    Justino Cornish, MD

## 2024-06-29 NOTE — Plan of Care (Signed)
  Problem: Nutrition: Goal: Adequate nutrition will be maintained Outcome: Progressing   Problem: Coping: Goal: Level of anxiety will decrease Outcome: Progressing   Problem: Elimination: Goal: Will not experience complications related to bowel motility Outcome: Progressing Goal: Will not experience complications related to urinary retention Outcome: Progressing   Problem: Pain Managment: Goal: General experience of comfort will improve and/or be controlled Outcome: Progressing   Problem: Safety: Goal: Ability to remain free from injury will improve Outcome: Progressing   Problem: Skin Integrity: Goal: Risk for impaired skin integrity will decrease Outcome: Progressing

## 2024-06-29 NOTE — Progress Notes (Signed)
 Progress Note   Patient: [name] FMW:[id number] DOB: 1966-09-16 DOA: 03/04/2024  DOS: the patient was seen and examined on 06/29/2024   Brief hospital course:  Patient is a 58 year old male with medical history significant for COPD, cocaine abuse, presented to the ED 03/04/24 for slower speech, confusion and right-sided weakness, CT head showed left MCA infarct, CTA head and neck revealed an M2 occlusion on the left,   Echocardiogram revealed large mobile left ventricular thrombus on the left and right ventricular apical thrombus in the right, ejection fraction less than 20%.  Cardiology was consulted recommended DOAC.  PT evaluated patient and recommended skilled nursing facility placement.  Patient was found to lack medical decision capacity/status and not amenable to determining his discharge disposition or leave AGAINST MEDICAL ADVICE.  He intermittently declines physical exams and medications. TOC is pursuing guardianship with APS as he is lacking medical decision capacity. Psychiatry presently recommending to await APS decision for guardianship before discharging home.   Assessment and Plan:  Acute embolic CVA -imaging revealed left MCA territory infarct, caudate nucleus, lentiform nucleus and internal capsule MRI confirmed.  TEE was noted for large mobile left lower clot just under the mitral valve with an EF of 20% and a small right apical clot with reduced RV systolic function.  Continues on Xarelto .    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  Continue Lasix , losartan , Coreg , Aldactone .  Xarelto  on board (better compliance).    Schizoaffective disorder bipolar disorder lacks medical capacity -On presentation, Psych was consulted and recommended that the patient lacks medical capacity to make decisions if the patient attempts to leave AGAINST MEDICAL ADVICE he should be involuntarily committed.  Psych  recommending APS guardianship, TOC in touch with APS regarding this as patient lacks medical capacity.  Discussion today 7/30 about psych reevaluating, however sinc eit was already decided to have court deem capacity, no reason to change at this point.    Essential hypertension -Coreg , Lasix , losartan , Aldactone .  Intermittent noncompliance   Hyperlipidemia: -Continue Crestor .   Diabetes mellitus type 2 - A1c of 5.4. refuses monitoring    Polysubstance abuse -UDS was positive for cocaine and benzos.   Physical Deconditioning/right AKA and left BKA. - Working on prosthesis   History of COPD -Bronchodilators as needed   History of DVT -On Xarelto  but frequently refuses   Disposition:  -Pending court date for competency (sept 8th?). Case management on board.    Subjective: Patient lying in bed.  Complaining of headache this morning.  Denies any fever, chills, chest pain, shortness of breath, nausea, vomiting.  Discussion with case management and psychiatry about reevaluating patient competency.   Physical Exam:  Vitals:   06/28/24 1644 06/28/24 2045 06/28/24 2246 06/28/24 2326  BP: (P) 128/88 (!) 150/76 136/71 (!) 143/72  Pulse: (P) 60 75 67 66  Resp:  18 16   TempSrc:      SpO2:  92% 95% 98%  Weight:      Height:        GENERAL:  Alert, pleasant, disheveled HEENT:  EOMI CARDIOVASCULAR:  RRR, no murmurs appreciated RESPIRATORY:  Clear to auscultation, no wheezing, rales, or rhonchi GASTROINTESTINAL:  Soft, nontender, nondistended EXTREMITIES: BL amputations NEURO:  No new focal deficits appreciated SKIN:  No rashes noted PSYCH:  Appropriate mood and affect     Data Reviewed:  Imaging Studies: CT HEAD WO CONTRAST ( ) Result Date: 06/11/2024 CLINICAL DATA:  Altered mental status, nontraumatic (Ped 0-17y) EXAM: CT HEAD WITHOUT CONTRAST TECHNIQUE: Contiguous axial images were obtained from the base of the skull through the vertex without intravenous contrast.  RADIATION DOSE REDUCTION: This exam was performed according to the departmental dose-optimization program which includes automated exposure control, adjustment of the mA and/or kV according to patient size and/or use of iterative reconstruction technique. COMPARISON:  MRI of the head and CT angiogram of the head dated March 04, 2024. FINDINGS: Brain: There is moderate generalized is cerebral volume loss present. There are encephalomalacia changes within the left periventricular white matter, insula and external capsule. Chronic encephalomalacia changes are also noted within the cerebellar hemispheres bilaterally. There is no evidence of hemorrhage, mass or acute cortical infarct. Vascular: Calcifications within the carotid siphons. Skull: Intact and unremarkable. Sinuses/Orbits: Clear paranasal sinuses. Chronic medial orbital wall blowout fracture deformity. The orbits are otherwise unremarkable. Other: None. IMPRESSION: 1. Chronic encephalomalacia changes within the cerebellar hemispheres bilaterally and left periventricular and deep cerebral white matter. Electronically Signed   By: Evalene Coho M.D.   On: 06/11/2024 18:07   DG CHEST PORT 1 VIEW Result Date: 06/11/2024 CLINICAL DATA:  Dyspnea EXAM: PORTABLE CHEST 1 VIEW COMPARISON:  03/10/2024 FINDINGS: Mild enlargement of the cardiopericardial silhouette, improved from previous. The lungs appear clear. No blunting of the costophrenic angles. Mild lower thoracic spondylosis. IMPRESSION: 1. Mild enlargement of the cardiopericardial silhouette, improved from previous. 2. Mild lower thoracic spondylosis. Electronically Signed   By: Ryan Salvage M.D.   On: 06/11/2024 16:23    There are no new results to review at this time.  Previous records (including but not limited to H&P, progress notes, nursing notes, TOC management) were reviewed in assessment of this patient.  Labs: CBC: No results for input(s): WBC, NEUTROABS, HGB, HCT, MCV,  PLT in the last 168 hours. Basic Metabolic Panel: No results for input(s): NA, K, CL, CO2, GLUCOSE, BUN, CREATININE, CALCIUM , MG, PHOS in the last 168 hours. Liver Function Tests: No results for input(s): AST, ALT, ALKPHOS, BILITOT, PROT, ALBUMIN in the last 168 hours. CBG: No results for input(s): GLUCAP in the last 168 hours.  Scheduled Meds:  carvedilol   10 mg Oral Daily   furosemide   40 mg Oral Daily   gabapentin   200 mg Oral BID   lisinopril   40 mg Oral Daily   revefenacin   175 mcg Nebulization Daily   rivaroxaban   20 mg Oral QHS   rosuvastatin   5 mg Oral QHS   spironolactone   25 mg Oral Daily   Continuous Infusions: PRN Meds:.albuterol , feeding supplement, hydrALAZINE , labetalol , lactulose , mouth rinse, oxyCODONE   Family Communication: None at bedside  Disposition: Status is: Inpatient Remains inpatient appropriate because: Position     Time spent: 35 minutes  Length of inpatient stay: 117 days  Author: Carliss LELON Canales, DO 06/29/2024 1:40 PM  For on call review [email or web address].

## 2024-06-29 NOTE — Progress Notes (Signed)
 Pt has c/o of headache pain for two days pt stated.  Md aware.  Pt has not felt good today and chose to lye in bed.  VS stable.

## 2024-06-29 NOTE — Hospital Course (Signed)
 Patient is a 58 year old male with medical history significant for COPD, cocaine abuse, presented to the ED 03/04/24 for slower speech, confusion and right-sided weakness, CT head showed left MCA infarct, CTA head and neck revealed an M2 occlusion on the left,   Echocardiogram revealed large mobile left ventricular thrombus on the left and right ventricular apical thrombus in the right, ejection fraction less than 20%.  Cardiology was consulted recommended DOAC.  PT evaluated patient and recommended skilled nursing facility placement.  Patient was found to lack medical decision capacity/status and not amenable to determining his discharge disposition or leave AGAINST MEDICAL ADVICE.  He intermittently declines physical exams and medications. TOC is pursuing guardianship with APS as he is lacking medical decision capacity. Psychiatry presently recommending to await APS decision for guardianship before discharging home.    Assessment and Plan:   Acute embolic CVA -imaging revealed left MCA territory infarct, caudate nucleus, lentiform nucleus and internal capsule MRI confirmed.  TEE was noted for large mobile left lower clot just under the mitral valve with an EF of 20% and a small right apical clot with reduced RV systolic function.  Continues on Xarelto .    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  Continue Lasix , losartan , Coreg , Aldactone .  Xarelto  on board (better compliance).    Schizoaffective disorder bipolar disorder lacks medical capacity -On presentation, Psych was consulted and recommended that the patient lacks medical capacity to make decisions if the patient attempts to leave AGAINST MEDICAL ADVICE he should be involuntarily committed.  Psych recommending APS guardianship, TOC in touch with APS regarding this as patient lacks medical capacity.  Discussion today 7/30 about psych reevaluating, however sinc eit  was already decided to have court deem capacity, no reason to change at this point.    Essential hypertension -Coreg , Lasix , losartan , Aldactone .  Intermittent noncompliance   Hyperlipidemia: -Continue Crestor .   Diabetes mellitus type 2 - A1c of 5.4. refuses monitoring    Polysubstance abuse -UDS was positive for cocaine and benzos.   Physical Deconditioning/right AKA and left BKA. - Working on prosthesis   History of COPD -Bronchodilators as needed   History of DVT -On Xarelto  but frequently refuses   Disposition:  -Pending court date for competency (sept 8th?). Case management on board.

## 2024-06-29 NOTE — Ethics Note (Signed)
 Ethics consult received from Psych. They have been consulted on this patient to re- evaluate for capacity for discharge until court competency hearing. Patient had been deemed to not have had capacity in a recent evaluation therefore, APS was called and request for assignment of a court guardian and evaluation for competency was made.  Patient does not have family or surrogate and reported to have unsafe living conditions. His mental state has waxed and waned during this admission.  At this point a discharge plan would be unsafe and likely, very quickly, the patient would be returned to the ED.  The correct process is in place to obtain guardianship (court date set) of this patient and the ethical standard of non-maleficence would apply, to do no harm.  Discharge at this time would place the patient at risk for harm therefore, this Ethics evaluator agreed with Psych that re-evaluation for capacity would not be in the best interest of the patient.  Thank you for this consultation. On call ethics consultant is available for questions. (See AMION)  Beverley Leeroy Sans, MSN, RN-BC, [facility], [facility] Ethics Committee Consultant

## 2024-06-29 NOTE — Progress Notes (Signed)
 Chaplain attempted visit with pt, but he was in a meeting with three other staff members and requested I come back later. I tried again at 11:00AM, but pt was asleep. Chaplain now at [facility], however other chaplains remain available if needed.

## 2024-06-30 DIAGNOSIS — I639 Cerebral infarction, unspecified: Secondary | ICD-10-CM | POA: Diagnosis not present

## 2024-06-30 LAB — BASIC METABOLIC PANEL WITH GFR
Anion gap: 7 (ref 5–15)
BUN: 18 mg/dL (ref 6–20)
CO2: 26 mmol/L (ref 22–32)
Calcium: 9.2 mg/dL (ref 8.9–10.3)
Chloride: 107 mmol/L (ref 98–111)
Creatinine, Ser: 1.33 mg/dL — ABNORMAL HIGH (ref 0.61–1.24)
GFR, Estimated: >60 mL/min (ref >60)
Glucose, Bld: 94 mg/dL (ref 70–99)
Potassium: 4.2 mmol/L (ref 3.5–5.1)
Sodium: 140 mmol/L (ref 135–145)

## 2024-06-30 LAB — CBC
HCT: 45.4 % (ref 39.0–52.0)
Hemoglobin: 15.3 g/dL (ref 13.0–17.0)
MCH: 30.7 pg (ref 26.0–34.0)
MCHC: 33.7 g/dL (ref 30.0–36.0)
MCV: 91.2 fL (ref 80.0–100.0)
Platelets: 203 K/uL (ref 150–400)
RBC: 4.98 MIL/uL (ref 4.22–5.81)
RDW: 14.6 % (ref 11.5–15.5)
WBC: 6.1 K/uL (ref 4.0–10.5)
nRBC: 0 % (ref 0.0–0.2)

## 2024-06-30 MED ORDER — MORPHINE SULFATE (PF) 2 MG/ML IV SOLN: 1.0000 mg | Freq: Two times a day (BID) | INTRAVENOUS | Status: DC | PRN

## 2024-06-30 MED ORDER — DIPHENHYDRAMINE HCL 25 MG PO CAPS
25.0000 mg | ORAL_CAPSULE | Freq: Once | ORAL | Status: AC
  Administered 2024-06-30: 25 mg via ORAL
  Filled 2024-06-30: qty 1

## 2024-06-30 MED ORDER — MORPHINE SULFATE (PF) 2 MG/ML IV SOLN: 1.0000 mg | Freq: Two times a day (BID) | INTRAVENOUS | Status: AC | PRN

## 2024-06-30 MED ORDER — SODIUM CHLORIDE 0.9 % IV BOLUS: 500.0000 mL | Freq: Once | INTRAVENOUS | Status: DC

## 2024-06-30 MED ORDER — PROCHLORPERAZINE MALEATE 10 MG PO TABS
10.0000 mg | ORAL_TABLET | Freq: Once | ORAL | Status: AC
  Administered 2024-06-30: 10 mg via ORAL
  Filled 2024-06-30: qty 1

## 2024-06-30 NOTE — Progress Notes (Signed)
 IVT consult placed for peripheral access. Patient to receive 500 ml IV bolus + iv morphine . Attempted x3 (2 with ultrasound) - patient has extensive scar tissue in right forearm. Blood return noted, and placement confirmed x2 with US , however unable to thread catheter. Primary RN notified and MD aware. Patient to hydrate PO and pain medication to be adjusted. No other needs identified at this time.   Graceyn Fodor R Zain Lankford, RN

## 2024-06-30 NOTE — Progress Notes (Signed)
 Occupational Therapy Treatment Patient Details Name: [name] MRN: [id number] DOB: 08-09-1966 Today's Date: 06/30/2024   History of present illness Patient is 58 y.o. male presented to ED on 03/04/24 with stroke like symptoms when caregiver found him with slurred speech and right sided weakness. Head CT reveals Acute left MCA territory infarct affecting the insula, caudate nucleus, lentiform nucleus and internal capsule. PMH significant of  Schizoaffective disorder, heart failure with reduced ejection fraction, pretension, DMII, COPD, s/p bilateral amputations, chronic pain, and polysubstance abuse.   OT comments  Patient continues to make good gains with OT treatment. Patient performing grooming seated at sink upon entry. Patient stating he had a headache and required increased time for tub bench transfer and CGA for safety. Patient able to bathe self seated on shower seat and stated decreased headache after shower. Patient able to perform dressing seated in wheelchair. Patient attempted to pull up pants with lateral leans but required min assist to complete. Discharge recommendations continue to be appropriate. Acute OT to continue to follow to address established goals.       If plan is discharge home, recommend the following:  A little help with walking and/or transfers;A little help with bathing/dressing/bathroom;Supervision due to cognitive status;Direct supervision/assist for financial management;Direct supervision/assist for medications management   Equipment Recommendations  None recommended by OT    Recommendations for Other Services      Precautions / Restrictions Precautions Precautions: Fall Recall of Precautions/Restrictions: Impaired Precaution/Restrictions Comments: h/o BLE amputations Required Braces or Orthoses: Other Brace Other Brace: L prosthesis Restrictions Weight Bearing Restrictions Per Provider Order: No       Mobility Bed Mobility Overal bed mobility:  Modified Independent             General bed mobility comments: OOB in recliner    Transfers Overall transfer level: Needs assistance Equipment used: None Transfers: Bed to chair/wheelchair/BSC            Lateral/Scoot Transfers: Contact guard assist General transfer comment: CGA for safety with shower bench transfers     Balance Overall balance assessment: Mild deficits observed, not formally tested Sitting-balance support: Bilateral upper extremity supported Sitting balance-Leahy Scale: Good Sitting balance - Comments: able to maintain sitting balance on tub bench                                   ADL either performed or assessed with clinical judgement   ADL Overall ADL's : Needs assistance/impaired     Grooming: Wash/dry hands;Independent;Sitting Grooming Details (indicate cue type and reason): chair level at sink Upper Body Bathing: Supervision/ safety;Sitting Upper Body Bathing Details (indicate cue type and reason): in shower Lower Body Bathing: Supervison/ safety;Sitting/lateral leans Lower Body Bathing Details (indicate cue type and reason): in shower Upper Body Dressing : Set up;Sitting Upper Body Dressing Details (indicate cue type and reason): donned T-shirt Lower Body Dressing: Minimal assistance;Sitting/lateral leans Lower Body Dressing Details (indicate cue type and reason): assistance pulling up pants while in the chair and patient using UEs to clear bottom         Tub/ Shower Transfer: Contact guard assist;Tub bench Tub/Shower Transfer Details (indicate cue type and reason): CGA for safety with lateral scoot transfers to tub bench   General ADL Comments: patient states he performs toilet transfers to regular toilet with lateral scoot transfer and uses grab bar to assist    Extremity/Trunk Assessment  Vision       Perception     Praxis     Communication Communication Communication: Other (comment) (word  finding difficulties) Factors Affecting Communication: Difficulty expressing self;Reduced clarity of speech   Cognition Arousal: Alert Behavior During Therapy: WFL for tasks assessed/performed Cognition: Cognition impaired           Executive functioning impairment (select all impairments): Problem solving                   Following commands: Intact Following commands impaired: Follows one step commands inconsistently, Follows one step commands with increased time      Cueing   Cueing Techniques: Verbal cues, Visual cues  Exercises      Shoulder Instructions       General Comments      Pertinent Vitals/ Pain       Pain Assessment Pain Assessment: Faces Faces Pain Scale: Hurts a little bit Pain Location: Headache Pain Descriptors / Indicators: Headache Pain Intervention(s): Monitored during session  Home Living                                          Prior Functioning/Environment              Frequency  Min 1X/week        Progress Toward Goals  OT Goals(current goals can now be found in the care plan section)  Progress towards OT goals: Progressing toward goals  Acute Rehab OT Goals Patient Stated Goal: to be more independent OT Goal Formulation: With patient Time For Goal Achievement: 07/14/24 Potential to Achieve Goals: Fair ADL Goals Pt Will Perform Lower Body Bathing: with mod assist;sitting/lateral leans Pt Will Perform Lower Body Dressing: with modified independence;sitting/lateral leans;bed level Pt Will Transfer to Toilet: with modified independence;bedside commode (drop arm BSC, no cues for safety) Pt Will Perform Toileting - Clothing Manipulation and hygiene: with mod assist;sitting/lateral leans Pt Will Perform Tub/Shower Transfer: Tub transfer;tub bench;with modified independence Additional ADL Goal #4: Pt will demonstrate appropriate safety awareness 100% of the time when positioning w/c to transfer  Plan       Co-evaluation                 AM-PAC OT 6 Clicks Daily Activity     Outcome Measure   Help from another person eating meals?: None Help from another person taking care of personal grooming?: A Little Help from another person toileting, which includes using toliet, bedpan, or urinal?: A Little Help from another person bathing (including washing, rinsing, drying)?: A Little Help from another person to put on and taking off regular upper body clothing?: A Little Help from another person to put on and taking off regular lower body clothing?: A Little 6 Click Score: 19    End of Session Equipment Utilized During Treatment: Other (comment) (wheelchair and tub bench)  OT Visit Diagnosis: Unsteadiness on feet (R26.81);Other abnormalities of gait and mobility (R26.89);Muscle weakness (generalized) (M62.81);Other symptoms and signs involving the nervous system (R29.898);Other symptoms and signs involving cognitive function;Hemiplegia and hemiparesis Hemiplegia - Right/Left: Right Hemiplegia - dominant/non-dominant: Dominant Hemiplegia - caused by: Cerebral infarction   Activity Tolerance Patient tolerated treatment well   Patient Left in chair;with call bell/phone within reach   Nurse Communication Mobility status        Time: 8870-8799 OT Time Calculation (min): 31 min  Charges: OT General  Charges $OT Visit: 1 Visit OT Treatments $Self Care/Home Management : 23-37 mins  [name], OTA Acute Rehabilitation Services  Office [phone]   [name] 06/30/2024, 1:12 PM

## 2024-06-30 NOTE — Plan of Care (Signed)
  Problem: Acute Rehab OT Goals (only OT should resolve) Goal: OT Additional ADL Goal #1 Outcome: Not Applicable Goal: OT Additional ADL Goal #3 Outcome: Not Applicable   Problem: Acute Rehab OT Goals (only OT should resolve) Goal: OT Additional ADL Goal #2 Outcome: Not Applicable

## 2024-06-30 NOTE — Progress Notes (Signed)
 Progress Note   Patient: [name] FMW:[id number] DOB: Nov 15, 1966 DOA: 03/04/2024  DOS: the patient was seen and examined on 06/30/2024   Brief hospital course:  Patient is a 58 year old male with medical history significant for COPD, cocaine abuse, presented to the ED 03/04/24 for slower speech, confusion and right-sided weakness, CT head showed left MCA infarct, CTA head and neck revealed an M2 occlusion on the left,   Echocardiogram revealed large mobile left ventricular thrombus on the left and right ventricular apical thrombus in the right, ejection fraction less than 20%.  Cardiology was consulted recommended DOAC.  PT evaluated patient and recommended skilled nursing facility placement.  Patient was found to lack medical decision capacity/status and not amenable to determining his discharge disposition or leave AGAINST MEDICAL ADVICE.  He intermittently declines physical exams and medications. TOC is pursuing guardianship with APS as he is lacking medical decision capacity. Psychiatry presently recommending to await APS decision for guardianship before discharging home.   Assessment and Plan:  Acute embolic CVA -imaging revealed left MCA territory infarct, caudate nucleus, lentiform nucleus and internal capsule MRI confirmed.  TEE was noted for large mobile left lower clot just under the mitral valve with an EF of 20% and a small right apical clot with reduced RV systolic function.  Continues on Xarelto .    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  Continue Lasix , losartan , Coreg , Aldactone .  Xarelto  on board (better compliance).    Schizoaffective disorder bipolar disorder lacks medical capacity -On presentation, Psych was consulted and recommended that the patient lacks medical capacity to make decisions if the patient attempts to leave AGAINST MEDICAL ADVICE he should be involuntarily committed.  Psych  recommending APS guardianship, TOC in touch with APS regarding this as patient lacks medical capacity.  Discussion today 7/30 about psych reevaluating, however sinc eit was already decided to have court deem capacity, no reason to change at this point.    Essential hypertension -Coreg , Lasix , losartan , Aldactone .  Intermittent noncompliance   Hyperlipidemia: -Continue Crestor .   Diabetes mellitus type 2 - A1c of 5.4. refuses monitoring    Polysubstance abuse -UDS was positive for cocaine and benzos.   Physical Deconditioning/right AKA and left BKA. - Working on prosthesis   History of COPD -Bronchodilators as needed   History of DVT -On Xarelto  but frequently refuses  Acute headache - Patient complaining of feeling somewhat orthostatic as well.  Will order 500 mL NS bolus.  Patient showed mild improvement with antiemetics and Benadryl .  Will also order as needed morphine  1 mg every 12 hours for severe pain.   Disposition:  -Pending court date for competency (sept 8th?). Case management on board.    Subjective: Patient lying in bed.  Complaining of headache again this morning.  Admits to some dizziness when he sits up from bed.  Denies any fever, chills, chest pain, shortness of breath, nausea, vomiting.  Patient is agreeable to receiving an IV.  Physical Exam:  Vitals:   06/29/24 1530 06/29/24 2115 06/29/24 2141 06/30/24 0957  BP: (!) 141/83 (!) 148/96 (!) 148/99 120/60  Pulse: (!) 57 (!) 55    Resp: 16 14    Temp: 98.6 F (37 C)     TempSrc: Oral     SpO2: 99%     Weight:      Height:        GENERAL:  Alert, pleasant, disheveled HEENT:  EOMI CARDIOVASCULAR:  RRR, no murmurs appreciated RESPIRATORY:  Clear to auscultation, no wheezing, rales, or rhonchi GASTROINTESTINAL:  Soft, nontender, nondistended EXTREMITIES: BL amputations NEURO:  No new focal deficits appreciated SKIN:  No rashes noted PSYCH:  Appropriate mood and affect     Data Reviewed:  Imaging  Studies: CT HEAD WO CONTRAST ( ) Result Date: 06/29/2024 CLINICAL DATA:  Increasing headaches EXAM: CT HEAD WITHOUT CONTRAST TECHNIQUE: Contiguous axial images were obtained from the base of the skull through the vertex without intravenous contrast. RADIATION DOSE REDUCTION: This exam was performed according to the departmental dose-optimization program which includes automated exposure control, adjustment of the mA and/or kV according to patient size and/or use of iterative reconstruction technique. COMPARISON:  06/11/2024 FINDINGS: Brain: Stable encephalomalacia changes are noted in the cerebral hemispheres bilaterally as well as in the left parietal region. Chronic white matter ischemic changes noted. No acute hemorrhage or acute infarct is seen. Vascular: No hyperdense vessel or unexpected calcification. Skull: Normal. Negative for fracture or focal lesion. Sinuses/Orbits: No acute finding. Other: None. IMPRESSION: Chronic encephalomalacia and white matter ischemic changes. No acute abnormality noted. Electronically Signed   By: Oneil Devonshire M.D.   On: 06/29/2024 21:04   CT HEAD WO CONTRAST ( ) Result Date: 06/11/2024 CLINICAL DATA:  Altered mental status, nontraumatic (Ped 0-17y) EXAM: CT HEAD WITHOUT CONTRAST TECHNIQUE: Contiguous axial images were obtained from the base of the skull through the vertex without intravenous contrast. RADIATION DOSE REDUCTION: This exam was performed according to the departmental dose-optimization program which includes automated exposure control, adjustment of the mA and/or kV according to patient size and/or use of iterative reconstruction technique. COMPARISON:  MRI of the head and CT angiogram of the head dated March 04, 2024. FINDINGS: Brain: There is moderate generalized is cerebral volume loss present. There are encephalomalacia changes within the left periventricular white matter, insula and external capsule. Chronic encephalomalacia changes are also noted within  the cerebellar hemispheres bilaterally. There is no evidence of hemorrhage, mass or acute cortical infarct. Vascular: Calcifications within the carotid siphons. Skull: Intact and unremarkable. Sinuses/Orbits: Clear paranasal sinuses. Chronic medial orbital wall blowout fracture deformity. The orbits are otherwise unremarkable. Other: None. IMPRESSION: 1. Chronic encephalomalacia changes within the cerebellar hemispheres bilaterally and left periventricular and deep cerebral white matter. Electronically Signed   By: Evalene Coho M.D.   On: 06/11/2024 18:07   DG CHEST PORT 1 VIEW Result Date: 06/11/2024 CLINICAL DATA:  Dyspnea EXAM: PORTABLE CHEST 1 VIEW COMPARISON:  03/10/2024 FINDINGS: Mild enlargement of the cardiopericardial silhouette, improved from previous. The lungs appear clear. No blunting of the costophrenic angles. Mild lower thoracic spondylosis. IMPRESSION: 1. Mild enlargement of the cardiopericardial silhouette, improved from previous. 2. Mild lower thoracic spondylosis. Electronically Signed   By: Ryan Salvage M.D.   On: 06/11/2024 16:23    There are no new results to review at this time.  Previous records (including but not limited to H&P, progress notes, nursing notes, TOC management) were reviewed in assessment of this patient.  Labs: CBC: Recent Labs  Lab 06/30/24 1037  WBC 6.1  HGB 15.3  HCT 45.4  MCV 91.2  PLT 203   Basic Metabolic Panel: Recent Labs  Lab 06/30/24 1037  NA 140  K 4.2  CL 107  CO2 26  GLUCOSE 94  BUN 18  CREATININE 1.33*  CALCIUM  9.2   Liver Function Tests: No results for input(s): AST, ALT, ALKPHOS, BILITOT, PROT, ALBUMIN in the last 168 hours. CBG: No results for input(s): GLUCAP  in the last 168 hours.  Scheduled Meds:  carvedilol   10 mg Oral Daily   furosemide   40 mg Oral Daily   gabapentin   200 mg Oral BID   lisinopril   40 mg Oral Daily   revefenacin   175 mcg Nebulization Daily   rivaroxaban   20 mg Oral QHS    rosuvastatin   5 mg Oral QHS   spironolactone   25 mg Oral Daily   Continuous Infusions:  sodium chloride      PRN Meds:.albuterol , feeding supplement, hydrALAZINE , labetalol , lactulose , morphine  injection, mouth rinse, oxyCODONE   Family Communication: None at bedside  Disposition: Status is: Inpatient Remains inpatient appropriate because: Position     Time spent: 34 minutes  Length of inpatient stay: 118 days  Author: Carliss LELON Canales, DO 06/30/2024 12:55 PM  For on call review [email or web address].

## 2024-06-30 NOTE — Consult Note (Signed)
 Brief Psychiatry Consult Note  The patient was last seen by the psychiatry service on 06/29/2024. Interim documentation by primary team and nursing staff has been reviewed. At this time, patient is pending competency evaluation, and there is no evidence of acute psychiatric disturbance requiring ongoing psychiatric consultation. Closing loop with guardian ad litum, [name], to discuss plan to continue with competency evaluation in Sept prior to discharge. Please see last consult note for full assessment.   Collateral 7/31, [name], [phone]: [name] reported that he feels that patient has capacity to leave hospital and advocating that he be in a less restrictive setting. [name] reported that he is going to have a lawyer help patient designate a MPOA. [name] is requesting that we declare that patient has capacity to leave hospital. I reported to [name] that we agree with having an attorney speak with patient regarding MPOA and discussed that medical team would not be involved in this process as this is a legal process. Until then he has no next of kin / surrogate decision maker and is pending formal competency evaluation from a judge, and hospitalization will continue until this evaluation is complete. Discussed that while he has pending competency evaluation that we will not provide capacity evaluation. Furthermore, pt waxes and wanes which makes capacity evaluation not indicated and formally evaluation of competency next step. Furthermore discussed that patient does not have a safe discharge plan and pt is not actively seeking to leave the hospital.  Discussed that social worker on primary team is the point of contact. Informed them that the ethics team is also aware and recommended pt staying in hospital.   We will sign off at this time. This has been communicated to the primary team. If issues arise in the future, don't hesitate to reconsult the Psychiatry Inpatient Consult Service.   Justino Cornish, MD PGY-2  Psychiatry Resident 06/30/2024, 11:40 AM

## 2024-07-01 DIAGNOSIS — I639 Cerebral infarction, unspecified: Secondary | ICD-10-CM | POA: Diagnosis not present

## 2024-07-01 DIAGNOSIS — I10 Essential (primary) hypertension: Secondary | ICD-10-CM | POA: Diagnosis not present

## 2024-07-01 DIAGNOSIS — I5022 Chronic systolic (congestive) heart failure: Secondary | ICD-10-CM | POA: Diagnosis not present

## 2024-07-01 DIAGNOSIS — Z789 Other specified health status: Secondary | ICD-10-CM | POA: Diagnosis not present

## 2024-07-01 MED ORDER — MECLIZINE HCL 25 MG PO TABS
25.0000 mg | ORAL_TABLET | Freq: Three times a day (TID) | ORAL | Status: DC | PRN
  Administered 2024-07-01 – 2024-08-09 (×47): 25 mg via ORAL
  Filled 2024-07-01 (×56): qty 1

## 2024-07-01 NOTE — Progress Notes (Signed)
 Progress Note   Patient: [name] FMW:[id number] DOB: 1966-01-23 DOA: 03/04/2024     119 DOS: the patient was seen and examined on 07/01/2024   Brief hospital course: Patient is a 58 year old male with medical history significant for COPD, cocaine abuse, presented to the ED 03/04/24 for slower speech, confusion and right-sided weakness, CT head showed left MCA infarct, CTA head and neck revealed an M2 occlusion on the left,   Echocardiogram revealed large mobile left ventricular thrombus on the left and right ventricular apical thrombus in the right, ejection fraction less than 20%.  Cardiology was consulted recommended DOAC.  PT evaluated patient and recommended skilled nursing facility placement.  Patient was found to lack medical decision capacity/status and not amenable to determining his discharge disposition or leave AGAINST MEDICAL ADVICE.  He intermittently declines physical exams and medications. TOC is pursuing guardianship with APS as he is lacking medical decision capacity. Psychiatry presently recommending to await APS decision for guardianship before discharging home.    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  TEE was noted for large mobile left lower clot just under the mitral valve with an EF of 20% and a small right apical clot with reduced RV systolic function.  Continues on Xarelto .    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  Continue Lasix , losartan , Coreg , Aldactone .  Xarelto  on board (better compliance).    Schizoaffective disorder bipolar disorder lacks medical capacity Psych was consulted and recommended that the patient lacks medical capacity to make decisions if the patient attempts to leave AGAINST MEDICAL ADVICE he should be involuntarily committed.  Psych recommending APS guardianship, TOC in  touch with APS regarding this as patient lacks medical capacity.  Discussion today 7/30 about psych reevaluating, however sinc eit was already decided to have court deem capacity, no reason to change at this point.    Essential hypertension Continue Coreg , Lasix , losartan , Aldactone .  Intermittent noncompliance   Hyperlipidemia: Continue Crestor .   Diabetes mellitus type 2 A1c of 5.4. refuses blood sugar monitoring    Polysubstance abuse UDS was positive for cocaine and benzos. Discussed ill effects of illicit drugs.   Physical Deconditioning/right AKA and left BKA. Working on prosthesis   History of COPD Bronchodilators as needed   History of DVT On Xarelto  but frequently refuses   Acute headache Dizziness Headache better today, has dizziness mostly with position. Started Meclizine. Need ENT eval as outpatient.   Disposition:  Pending court date for competency (sept 8th?). Case management on board.       Out of bed to chair. Incentive spirometry. Nursing supportive care. Fall, aspiration precautions. Diet:  Diet Orders (From admission, onward)     Start     Ordered   04/07/24 1155  Diet regular Fluid consistency: Thin  Diet effective now       Question:  Fluid consistency:  Answer:  Thin   04/07/24 1154           DVT prophylaxis:  rivaroxaban  (XARELTO ) tablet 20 mg  Level of care: Med-Surg   Code Status: Full Code  Subjective: Patient is seen and examined today morning. He did report dizziness with movement, change  in position. Also has headache. No nausea. Eating fair.  Physical Exam: Vitals:   06/30/24 2026 07/01/24 1010 07/01/24 1011 07/01/24 1546  BP: 91/81 124/66 (!) 132/93 108/73  Pulse:  81 63 68 80  Resp: 16 16 16 19   Temp:      TempSrc:      SpO2: 98% 100% 100% 100%  Weight:      Height:        General -  Middle aged African American male, distress due to dizziness HEENT - PERRLA, EOMI, atraumatic head, non tender sinuses. Lung -  Clear, basal rales, rhonchi, wheezes. Heart - S1, S2 heard, no murmurs, rubs, amputations b/l Abdomen - Soft, non tender, bowel sounds good Neuro - Alert, awake and oriented x 3, non focal exam. Skin - Warm and dry. Bilateral amputee on wheel chair.  Data Reviewed:      Latest Ref Rng & Units 06/30/2024   10:37 AM 04/16/2024    8:42 AM 04/10/2024    7:29 AM  CBC  WBC 4.0 - 10.5 K/uL 6.1  5.5  5.9   Hemoglobin 13.0 - 17.0 g/dL 84.6  85.0  84.3   Hematocrit 39.0 - 52.0 % 45.4  44.4  46.8   Platelets 150 - 400 K/uL 203  163  196       Latest Ref Rng & Units 06/30/2024   10:37 AM 04/16/2024    8:42 AM 04/10/2024    7:29 AM  BMP  Glucose 70 - 99 mg/dL 94  892  83   BUN 6 - 20 mg/dL 18  18  21    Creatinine 0.61 - 1.24 mg/dL 8.66  9.08  8.96   Sodium 135 - 145 mmol/L 140  137  139   Potassium 3.5 - 5.1 mmol/L 4.2  4.2  4.6   Chloride 98 - 111 mmol/L 107  104  104   CO2 22 - 32 mmol/L 26  20  25    Calcium  8.9 - 10.3 mg/dL 9.2  9.5  9.3    CT HEAD WO CONTRAST ( ) Result Date: 06/29/2024 CLINICAL DATA:  Increasing headaches EXAM: CT HEAD WITHOUT CONTRAST TECHNIQUE: Contiguous axial images were obtained from the base of the skull through the vertex without intravenous contrast. RADIATION DOSE REDUCTION: This exam was performed according to the departmental dose-optimization program which includes automated exposure control, adjustment of the mA and/or kV according to patient size and/or use of iterative reconstruction technique. COMPARISON:  06/11/2024 FINDINGS: Brain: Stable encephalomalacia changes are noted in the cerebral hemispheres bilaterally as well as in the left parietal region. Chronic white matter ischemic changes noted. No acute hemorrhage or acute infarct is seen. Vascular: No hyperdense vessel or unexpected calcification. Skull: Normal. Negative for fracture or focal lesion. Sinuses/Orbits: No acute finding. Other: None. IMPRESSION: Chronic encephalomalacia and white matter  ischemic changes. No acute abnormality noted. Electronically Signed   By: Oneil Devonshire M.D.   On: 06/29/2024 21:04    Family Communication: Discussed with patient, understand and agree. All questions answered.  Disposition: Status is: Inpatient Remains inpatient appropriate because: safe placement  Planned Discharge Destination: Barriers to discharge: APS guardianship      Time spent: 41 minutes  Author: Concepcion Riser, MD 07/01/2024 4:27 PM Secure chat 7am to 7pm For on call review [email or web address].

## 2024-07-01 NOTE — TOC Progression Note (Signed)
 Transition of Care [facility]) - Progression Note    Patient Details  Name: [name] MRN: [id number] Date of Birth: 06/01/1966  Transition of [facility]) CM/SW Contact  [name], [name] Phone Number: 07/01/2024, 3:49 PM  Clinical Narrative:   CSW spoke with patient's caregiver, [name], to provide update that medical team will not discharge patient prior to competency hearing. [name] indicated understanding, discussed that she had been looking at some places for the patient in [place] but has not found anything yet. [name] discussed that she had talked with [employer] that the patient still has a section 8 voucher. CSW to check with Rocky on what that covers to help the patient with rental assistance.  CSW met with patient's ACTT Team worker, [name], to discuss concerns the ACTT team has regarding patient's ability to care for himself. ACTT team is also in agreement that the patient cannot manage on his own; per [name], the only way he was managing in his apartment before was because [name] was helping him with everything, but it didn't work because the patient got evicted anyway. [name] provided contact information for the medical records office at [name] to obtain records to provide for court Delora Remington, [phone]).   CSW met with patient to answer questions and discuss. Patient is frustrated about the lack of a plan and not knowing when he can leave the hospital. CSW attempted to ask patient about his budget and where he would want to go, but patient struggled with answering the questions. CSW validated patient's frustration, and discussed reaching out to [facility] to check about the section 8 voucher and what help that provides him. Patient in agreement. CSW to follow.    Expected Discharge Plan: Skilled Nursing Facility Barriers to Discharge: English as a second language teacher, Active Substance Use - Placement, SNF Pending bed offer               Expected Discharge Plan  and Services       Living arrangements for the past 2 months: Apartment Expected Discharge Date: 03/14/24                                     Social Drivers of Health (SDOH) Interventions SDOH Screenings   Food Insecurity: Food Insecurity Present (03/06/2024)  Housing: High Risk (03/06/2024)  Transportation Needs: No Transportation Needs (03/06/2024)  Utilities: Patient Declined (03/06/2024)  Alcohol Screen: Medium Risk (06/24/2019)  Depression (PHQ2-9): Low Risk  (02/18/2024)  Recent Concern: Depression (PHQ2-9) - Medium Risk (01/21/2024)  Financial Resource Strain: High Risk (07/24/2021)   Received from [facility]  Physical Activity: Inactive (02/18/2024)  Social Connections: Unknown (04/05/2022)   Received from [facility]  Stress: Stress Concern Present (07/24/2021)   Received from [facility]  Tobacco Use: High Risk (03/04/2024)  Health Literacy: Adequate Health Literacy (02/18/2024)    Readmission Risk Interventions    12/21/2023    9:25 AM 12/09/2022    9:02 AM 12/01/2022    3:56 PM  Readmission Risk Prevention Plan  Transportation Screening Complete Complete Complete  HRI or Home Care Consult Complete    Social Work Consult for Recovery Care Planning/Counseling Complete    Palliative Care Screening Not Applicable    Medication Review Oceanographer) Complete Complete Complete  PCP or Specialist appointment within 3-5 days of discharge  Complete Complete  HRI or Home Care Consult  Complete Complete  SW Recovery Care/Counseling  Consult  Complete Complete  Palliative Care Screening  Not Applicable Not Applicable  Skilled Nursing Facility  Not Applicable Not Applicable  SNF Comments   n/a

## 2024-07-01 NOTE — TOC Progression Note (Signed)
 Transition of Care [facility]) - Progression Note    Patient Details  Name: [name] MRN: [id number] Date of Birth: 12/24/1965  Transition of Care [facility]) CM/SW Contact  [name], [name] Phone Number: 07/01/2024, 10:37 AM  Clinical Narrative:   CSW coordinated with medical team and psychiatry about patient's disposition and concerns regarding capacity/competency. Medical team and ethics suggest waiting on competency evaluation, will not change any decisions regarding capacity in the meantime. GAL information provided to psychiatry to discuss concerns. CSW to follow.    Expected Discharge Plan: Skilled Nursing Facility Barriers to Discharge: English as a second language teacher, Active Substance Use - Placement, SNF Pending bed offer               Expected Discharge Plan and Services       Living arrangements for the past 2 months: Apartment Expected Discharge Date: 03/14/24                                     Social Drivers of Health (SDOH) Interventions SDOH Screenings   Food Insecurity: Food Insecurity Present (03/06/2024)  Housing: High Risk (03/06/2024)  Transportation Needs: No Transportation Needs (03/06/2024)  Utilities: Patient Declined (03/06/2024)  Alcohol Screen: Medium Risk (06/24/2019)  Depression (PHQ2-9): Low Risk  (02/18/2024)  Recent Concern: Depression (PHQ2-9) - Medium Risk (01/21/2024)  Financial Resource Strain: High Risk (07/24/2021)   Received from [facility]  Physical Activity: Inactive (02/18/2024)  Social Connections: Unknown (04/05/2022)   [facility] from [facility]  Stress: Stress Concern Present (07/24/2021)   Received from [facility]  Tobacco Use: High Risk (03/04/2024)  Health Literacy: Adequate Health Literacy (02/18/2024)    Readmission Risk Interventions    12/21/2023    9:25 AM 12/09/2022    9:02 AM 12/01/2022    3:56 PM  Readmission Risk Prevention Plan  Transportation Screening Complete Complete Complete  HRI or Home Care Consult  Complete    Social Work Consult for Recovery Care Planning/Counseling Complete    Palliative Care Screening Not Applicable    Medication Review Oceanographer) Complete Complete Complete  PCP or Specialist appointment within 3-5 days of discharge  Complete Complete  HRI or Home Care Consult  Complete Complete  SW Recovery Care/Counseling Consult  Complete Complete  Palliative Care Screening  Not Applicable Not Applicable  Skilled Nursing Facility  Not Applicable Not Applicable  SNF Comments   n/a

## 2024-07-01 NOTE — Progress Notes (Signed)
 Speech Language Pathology Treatment: Cognitive-Linguistic  Patient Details Name: [name] MRN: [id number] DOB: [birth year] Today's Date: 07/01/2024 Time: 8564-8551 SLP Time Calculation (min) (ACUTE ONLY): 13 min  Assessment / Plan / Recommendation Clinical Impression  Pt is feeling a little frustrated this afternoon regarding overall plan going forward and feeling like he has a lot of unanswered questions, but he was still cooperative during SLP visit. He showed good recall, orientation, and awareness by acknowledging that this SLP typically comes on Thursdays, but that I was coming today (Friday) instead. He participated in appointment management task today, requiring Min cues for selective attention to answer all questions and answer them correctly. SLP will continue to follow to address functional cognition and communication.    HPI HPI: Patient is 58 y.o. male presented to ED on 03/04/24 with stroke like symptoms when caregiver found him with slurred speech and right sided weakness. Head CT reveals Acute left MCA territory infarct affecting the insula, caudate nucleus, lentiform nucleus and internal capsule. Pt initially evaluated by SLP 4/5 but s/o 4/22 due to repeated refusals. SLP reordered 5/13 for repeat cognitive-linguistic eval.   PMH significant of  Schizoaffective disorder, heart failure with reduced ejection fraction, pretension, DMII, COPD, s/p bilateral amputations, chronic pain, and polysubstance abuse.      SLP Plan  Continue with current plan of care          Recommendations                         Intermittent Supervision/Assistance Cognitive communication deficit (M58.158)     Continue with current plan of care     Leita SAILOR., M.A. CCC-SLP Acute Rehabilitation Services Office: [phone]  Secure chat preferred   07/01/2024, 3:03 PM

## 2024-07-01 NOTE — TOC Progression Note (Signed)
 Transition of Care [facility]) - Progression Note    Patient Details  Name: [name] MRN: [id number] Date of Birth: 1966-11-09  Transition of Care [facility]) CM/SW Contact  [name], [name] Phone Number: 07/01/2024, 10:38 AM  Clinical Narrative:   CSW met with patient to discuss housing and answer questions related to options. Patient did meet with housing specialist with [name] yesterday afternoon, but did not remember her name or her position, and said she really didn't tell him anything. CSW informed patient that she is housing specialist with [name] and will be assisting with his housing options. Patient frustrated that [name] is not helping him this time, feels like he isn't getting help from anywhere. CSW reiterated to patient that we need to make sure we find him somewhere safe to go and that he has adequate support, and patient agreed. Of note, patient very agreeable and pleasant during conversation, no agitation noted. CSW to continue to follow.    Expected Discharge Plan: Skilled Nursing Facility Barriers to Discharge: English as a second language teacher, Active Substance Use - Placement, SNF Pending bed offer               Expected Discharge Plan and Services       Living arrangements for the past 2 months: Apartment Expected Discharge Date: 03/14/24                                     Social Drivers of Health (SDOH) Interventions SDOH Screenings   Food Insecurity: Food Insecurity Present (03/06/2024)  Housing: High Risk (03/06/2024)  Transportation Needs: No Transportation Needs (03/06/2024)  Utilities: Patient Declined (03/06/2024)  Alcohol Screen: Medium Risk (06/24/2019)  Depression (PHQ2-9): Low Risk  (02/18/2024)  Recent Concern: Depression (PHQ2-9) - Medium Risk (01/21/2024)  Financial Resource Strain: High Risk (07/24/2021)   Received from [facility]  Physical Activity: Inactive (02/18/2024)  Social Connections: Unknown (04/05/2022)    Received from [facility]  Stress: Stress Concern Present (07/24/2021)   Received from [facility]  Tobacco Use: High Risk (03/04/2024)  Health Literacy: Adequate Health Literacy (02/18/2024)    Readmission Risk Interventions    12/21/2023    9:25 AM 12/09/2022    9:02 AM 12/01/2022    3:56 PM  Readmission Risk Prevention Plan  Transportation Screening Complete Complete Complete  HRI or Home Care Consult Complete    Social Work Consult for Recovery Care Planning/Counseling Complete    Palliative Care Screening Not Applicable    Medication Review Oceanographer) Complete Complete Complete  PCP or Specialist appointment within 3-5 days of discharge  Complete Complete  HRI or Home Care Consult  Complete Complete  SW Recovery Care/Counseling Consult  Complete Complete  Palliative Care Screening  Not Applicable Not Applicable  Skilled Nursing Facility  Not Applicable Not Applicable  SNF Comments   n/a

## 2024-07-02 DIAGNOSIS — I639 Cerebral infarction, unspecified: Secondary | ICD-10-CM | POA: Diagnosis not present

## 2024-07-02 DIAGNOSIS — I5022 Chronic systolic (congestive) heart failure: Secondary | ICD-10-CM | POA: Diagnosis not present

## 2024-07-02 DIAGNOSIS — Z789 Other specified health status: Secondary | ICD-10-CM | POA: Diagnosis not present

## 2024-07-02 DIAGNOSIS — I10 Essential (primary) hypertension: Secondary | ICD-10-CM | POA: Diagnosis not present

## 2024-07-02 NOTE — Progress Notes (Signed)
 Progress Note   Patient: [name] FMW:[id number] DOB: [birth year] DOA: 03/04/2024     120 DOS: the patient was seen and examined on 07/02/2024   Brief hospital course: [name] is a 58 year old male with medical history significant for COPD, cocaine abuse, presented to the ED 03/04/24 for slower speech, confusion and right-sided weakness, CT head showed left MCA infarct, CTA head and neck revealed an M2 occlusion on the left,   Echocardiogram revealed large mobile left ventricular thrombus on the left and right ventricular apical thrombus in the right, ejection fraction less than 20%.  Cardiology was consulted recommended DOAC.  PT evaluated patient and recommended skilled nursing facility placement.  Patient was found to lack medical decision capacity/status and not amenable to determining his discharge disposition or leave AGAINST MEDICAL ADVICE.  TOC is pursuing guardianship with APS as he is lacking medical decision capacity. Psychiatry presently recommending to await APS decision for guardianship before discharging home.    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  TEE was noted for large mobile left lower clot just under the mitral valve with an EF of 20% and a small right apical clot with reduced RV systolic function.  Continues on Xarelto .    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  Continue Lasix , losartan , Coreg , Aldactone .  Xarelto  on board (better compliance).    Schizoaffective disorder bipolar disorder lacks medical capacity Psych was consulted and recommended that the patient lacks medical capacity to make decisions if the patient attempts to leave AGAINST MEDICAL ADVICE he should be involuntarily committed.  Psych recommending APS guardianship, TOC in touch with APS regarding this as patient lacks medical  capacity.  Discussion today 7/30 about psych reevaluating, however sinc eit was already decided to have court deem capacity, no reason to change at this point.    Essential hypertension Continue Coreg , Lasix , losartan , Aldactone .  Intermittent noncompliance   Hyperlipidemia: Continue Crestor .   Diabetes mellitus type 2 A1c of 5.4. refuses blood sugar monitoring    Polysubstance abuse UDS was positive for cocaine and benzos. Discussed ill effects of illicit drugs.   Physical Deconditioning/right AKA and left BKA. Working on prosthesis   History of COPD Bronchodilators as needed   History of DVT On Xarelto  but frequently refuses   Acute headache Dizziness Headache better, dizziness improved with Meclizine . Need ENT eval as outpatient.   Disposition:  Pending court date for competency (sept 8th?). Case management on board.       Out of bed to chair. Incentive spirometry. Nursing supportive care. Fall, aspiration precautions. Diet:  Diet Orders (From admission, onward)     Start     Ordered   04/07/24 1155  Diet regular Fluid consistency: Thin  Diet effective now       Question:  Fluid consistency:  Answer:  Thin   04/07/24 1154           DVT prophylaxis:  rivaroxaban  (XARELTO ) tablet 20 mg  Level of care: Med-Surg   Code Status: Full Code  Subjective: Patient is seen and examined today morning. He did report dizziness improved with meclizine , no headache. No nausea. Eating fair. He is ambulating with wheel chair in hallway.  Physical Exam: Vitals:   07/01/24 1546 07/01/24 2025 07/02/24 0015 07/02/24 0733  BP: 108/73 (!) 147/108 (!) 184/130 (!) 198/109  Pulse: 80 (!) 109 ROLLEN)  108 (!) 103  Resp: 19 18 17 19   Temp:      TempSrc:      SpO2: 100% 96% 97% 95%  Weight:      Height:        General -  Middle aged African American male, distress due to dizziness HEENT - PERRLA, EOMI, atraumatic head, non tender sinuses. Lung - Clear, basal rales, rhonchi,  wheezes. Heart - S1, S2 heard, no murmurs, rubs, amputations b/l Abdomen - Soft, non tender, bowel sounds good Neuro - Alert, awake and oriented x 3, non focal exam. Skin - Warm and dry. Bilateral amputee on wheel chair.  Data Reviewed:      Latest Ref Rng & Units 06/30/2024   10:37 AM 04/16/2024    8:42 AM 04/10/2024    7:29 AM  CBC  WBC 4.0 - 10.5 K/uL 6.1  5.5  5.9   Hemoglobin 13.0 - 17.0 g/dL 84.6  85.0  84.3   Hematocrit 39.0 - 52.0 % 45.4  44.4  46.8   Platelets 150 - 400 K/uL 203  163  196       Latest Ref Rng & Units 06/30/2024   10:37 AM 04/16/2024    8:42 AM 04/10/2024    7:29 AM  BMP  Glucose 70 - 99 mg/dL 94  892  83   BUN 6 - 20 mg/dL 18  18  21    Creatinine 0.61 - 1.24 mg/dL 8.66  9.08  8.96   Sodium 135 - 145 mmol/L 140  137  139   Potassium 3.5 - 5.1 mmol/L 4.2  4.2  4.6   Chloride 98 - 111 mmol/L 107  104  104   CO2 22 - 32 mmol/L 26  20  25    Calcium  8.9 - 10.3 mg/dL 9.2  9.5  9.3    No results found.   Family Communication: Discussed with patient, understand and agree. All questions answered.  Disposition: Status is: Inpatient Remains inpatient appropriate because: safe placement  Planned Discharge Destination: Barriers to discharge: APS guardianship      Time spent: 38 minutes  Author: Concepcion Riser, MD 07/02/2024 4:55 PM Secure chat 7am to 7pm For on call review [email or web address].

## 2024-07-03 DIAGNOSIS — I639 Cerebral infarction, unspecified: Secondary | ICD-10-CM | POA: Diagnosis not present

## 2024-07-03 DIAGNOSIS — F250 Schizoaffective disorder, bipolar type: Secondary | ICD-10-CM | POA: Diagnosis not present

## 2024-07-03 DIAGNOSIS — I5022 Chronic systolic (congestive) heart failure: Secondary | ICD-10-CM | POA: Diagnosis not present

## 2024-07-03 DIAGNOSIS — I959 Hypotension, unspecified: Secondary | ICD-10-CM

## 2024-07-03 DIAGNOSIS — Z789 Other specified health status: Secondary | ICD-10-CM | POA: Diagnosis not present

## 2024-07-03 DIAGNOSIS — I10 Essential (primary) hypertension: Secondary | ICD-10-CM | POA: Diagnosis not present

## 2024-07-03 LAB — GLUCOSE, CAPILLARY: Glucose-Capillary: 148 mg/dL — ABNORMAL HIGH (ref 70–99)

## 2024-07-03 MED ORDER — LACTATED RINGERS IV BOLUS: 1000.0000 mL | Freq: Once | INTRAVENOUS | Status: DC

## 2024-07-03 MED ORDER — LURASIDONE HCL 40 MG PO TABS
40.0000 mg | ORAL_TABLET | Freq: Every day | ORAL | Status: DC
  Administered 2024-07-03 – 2024-08-12 (×39): 40 mg via ORAL
  Filled 2024-07-03 (×47): qty 1

## 2024-07-03 MED ORDER — LACTATED RINGERS IV BOLUS
250.0000 mL | Freq: Once | INTRAVENOUS | Status: AC
  Administered 2024-07-03: 250 mL via INTRAVENOUS

## 2024-07-03 MED ORDER — LORAZEPAM 1 MG PO TABS: 1.0000 mg | ORAL_TABLET | Freq: Four times a day (QID) | ORAL | Status: DC | PRN

## 2024-07-03 NOTE — Progress Notes (Signed)
 Patient sitting in his wheel chair at near the nurses station, looking around.  States that guy is watching me however when asked what guy, he states I'm not going to say.    RN offered patient a different room, he advised that he will think about it but does not want to be moved to a new room at this time.

## 2024-07-03 NOTE — Progress Notes (Signed)
 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  Cardiology was consulted recommended DOAC.  PT evaluated patient and recommended skilled nursing facility placement.  Patient was found to lack medical decision capacity/status and not amenable to determining his discharge disposition or leave AGAINST MEDICAL ADVICE.  TOC is pursuing guardianship with APS as he is lacking medical decision capacity. He did complain of halluacinations, eating poor, refused medications, has insomnia.   Psychiatry 8/3 advised to start Latuda  for his hallucinations. Rapid response called due to altered mentation, BP lower side. Fluids bolus, CT head, labs ordered.   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  TEE was noted for large mobile left lower clot just under the mitral valve with an EF of 20% and a small right apical clot with reduced RV systolic function.  Continues on Xarelto .   Altered mentation-  Low Blood pressure noted. SBP<70, MAP ~60. Rapid response called. IV fluid bolus 250ml x 2 ordered. Caution with antihypertensive medications. CT head, labs ordered.  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   Hold Lasix , losartan , Coreg , Aldactone  due to low BP.   Continue Xarelto .   Schizoaffective  disorder bipolar disorder Lacks medical capacity Psych recommended that the patient lacks medical capacity to make decisions if the patient attempts to leave AGAINST MEDICAL ADVICE he should be involuntarily committed.   Due to hallucinations, Psych followed 8/2 recommended Latuda  and await APS for guardianship, TOC in touch with APS regarding guardianship.  Court date for competency September first week.   Essential hypertension Hold Coreg , Lasix , losartan , Aldactone  due to his mentation and low BP.  Hyperlipidemia: Continue Crestor .   Diabetes mellitus type 2 A1c of 5.4. refuses blood sugar monitoring    Polysubstance abuse UDS was positive for cocaine and benzos. Discussed ill effects of illicit drugs.   Physical Deconditioning/right AKA and left BKA. Working on prosthesis   History of COPD Bronchodilators as needed   History of DVT On Xarelto  but frequently refuses   Acute headache Dizziness Headache better, dizziness improved with Meclizine . Need ENT eval as outpatient.   Disposition:  Pending court date for competency (sept 8th?). Case management on board.  Out of bed to chair. Incentive spirometry. Nursing supportive care. Fall, aspiration precautions. Diet:  Diet Orders (From admission, onward)     Start     Ordered   04/07/24 1155  Diet regular Fluid consistency: Thin  Diet effective now       Question:  Fluid consistency:  Answer:  Thin   04/07/24 1154           DVT prophylaxis:  rivaroxaban  (XARELTO ) tablet 20 mg  Level of care: Med-Surg   Code Status: Full Code  Subjective: Patient is seen and examined today morning. I saw him wheeling in hallway. Later in afternoon, he did roll his eyes and  lost consciousness. RN notified his BP lower side. Lethargic but able to answer.  Physical Exam: Vitals:   07/02/24 0015 07/02/24 0733 07/03/24 0010 07/03/24 0826  BP: (!) 184/130 (!) 198/109 (!) 155/113 (!) 116/90  Pulse: (!) 108 (!) 103 78 80  Resp: 17  19 18 19   Temp:   98.5 F (36.9 C)   TempSrc:   Oral   SpO2: 97% 95% 97% 98%  Weight:      Height:        General -  Middle aged African American male, lethargic HEENT - PERRLA, EOMI, atraumatic head, non tender sinuses. Lung - Clear, basal rales, rhonchi, wheezes. Heart - S1, S2 heard, no murmurs, rubs, amputations b/l Abdomen - Soft, non tender, bowel sounds good Neuro - lethargic, unable to do full neuro exam. Skin - Warm and dry. Bilateral amputee on wheel chair.  Data Reviewed:      Latest Ref Rng & Units 06/30/2024   10:37 AM 04/16/2024    8:42 AM 04/10/2024    7:29 AM  CBC  WBC 4.0 - 10.5 K/uL 6.1  5.5  5.9   Hemoglobin 13.0 - 17.0 g/dL 84.6  85.0  84.3   Hematocrit 39.0 - 52.0 % 45.4  44.4  46.8   Platelets 150 - 400 K/uL 203  163  196       Latest Ref Rng & Units 06/30/2024   10:37 AM 04/16/2024    8:42 AM 04/10/2024    7:29 AM  BMP  Glucose 70 - 99 mg/dL 94  892  83   BUN 6 - 20 mg/dL 18  18  21    Creatinine 0.61 - 1.24 mg/dL 8.66  9.08  8.96   Sodium 135 - 145 mmol/L 140  137  139   Potassium 3.5 - 5.1 mmol/L 4.2  4.2  4.6   Chloride 98 - 111 mmol/L 107  104  104   CO2 22 - 32 mmol/L 26  20  25    Calcium  8.9 - 10.3 mg/dL 9.2  9.5  9.3    No results found.  Disposition: Status is: Inpatient Remains inpatient appropriate because: mental status improvement, safe placement  Planned Discharge Destination: Barriers to discharge: APS guardianship      Time spent: 43 minutes  Author: Concepcion Riser, MD 07/03/2024 1:05 PM Secure chat 7am to 7pm For on call review [email or web address].

## 2024-07-03 NOTE — Progress Notes (Signed)
 Patient stated his windows were bothering him, all blinds were shut in his room to ease his mind, patient is currently in wheelchair self propels, has himself in door way of bathroom, and does not want to come out of that area at this time, refuses snack when offered.

## 2024-07-03 NOTE — Consult Note (Signed)
 [name]  Patient Name: .[name]  MRN: [id number]  DOB: 10-12-1966  Consult Order details:  Orders (From admission, onward)     Start     Ordered   06/28/24 1642  IP CONSULT TO PSYCHIATRY       Ordering Provider: Elpidio [name] DEL, MD  Provider:  (Not yet assigned)  Question Answer Comment  Location [facility]   Reason for Consult? Question about improved capacity now that patient has been receiving treatment in house.  Patient's attorney requested repeat psych consult for possible discharge planning      06/28/24 1642   04/05/24 1343  IP CONSULT TO PSYCHIATRY       Ordering Provider: Judeth Trenda BIRCH, MD  Provider:  (Not yet assigned)  Question Answer Comment  Location [facility]   Reason for Consult? Acute stroke, embolic from large intracardiac clots, refusing Eliquis .  Schizoaffective d/o, lacks MDM capacity, has consistently refused to take Latuda .  ?  parenteral/IM, possibly long-acting [facility] medications.      04/05/24 1346   03/30/24 1323  IP CONSULT TO PSYCHIATRY       Comments: 66M, lived alone, B/L amputee, wheelchair, P/W acute left MCA stroke, large cardiac thrombus, through course of hospital admission with issues of agitation, refusal of care, refusing SNF, felt unsafe to DC home and documentation regarding lack of capacity (however did not specifically see this addressed in multiple psychiatry evaluations), currently medically stable awaiting disposition. APS contacted.  Kindly refer to [facility] MDs note from 4/30 regarding need for capacity evaluation.  Ordering Provider: Judeth Trenda BIRCH, MD  Provider:  (Not yet assigned)  Question Answer Comment  Location [facility]   Reason for Consult? Kindly assist with capacity evaluation> details in comments.      03/30/24 1323   03/14/24 1515  IP CONSULT TO PSYCHIATRY       Ordering Provider: Elpidio [name] DEL, MD  Provider:  (Not yet  assigned)  Question Answer Comment  Location [facility]   Reason for Consult? Capacity evaluation -- patient verbally abusive, wants to go home, but oriented only to self.  Lives alone, seems to be unable to care for himself at home.  Essentially refusing all cares here      03/14/24 1514             Mode of Visit: In person    Psychiatry Consult Evaluation  Service Date: July 03, 2024 LOS:  LOS: 121 days  Chief Complaint I have been hearing voices telling me to kill myself.   Primary Psychiatric Diagnoses  Hx Schizoaffective disorder unspecified versus hx Substance induced psychosis/mood disorder versus hx MDD 2.  Stimulant use disorder (cocaine), in controlled environment  Assessment  [name] is a 58 y.o. male admitted: Medicallyfor 03/04/2024  2:31 PM for residual stroke with prolonged hospitalization due to inability to care for self. He carries the psychiatric diagnoses of stimulant use disorder and ?schizoaffective disorder bipolar type?, several suicide attempts by overdose/unspecified and has a past medical history of residual R side weakness and aphasia from L MCA stroke in April 2024 and chronic cardiomyopathy.   Pt hospitalized in April 2025 for large stroke resulting in residual deficits including R sided weakness, aphasia, and general cognitive impairment.  Psychiatry had deemed pt to lack capacity on last assessment, and then the primary team contacted APS for competency evaluation. Currently, pt's guardian ad litum, [name], is requesting primary  team to re-consult psych for capacity re-evaluation given improvements in pt's cognition and does not believe pt lacks competency (per 7/28 [name] note).  Capacity question is unclear but appears to be a question of whether patient can leave the hospital and care for himself.  This is of course independent of competency evaluation which must be done by judge in [place]  and cannot be done by medical  providers or guardian ad litem.  On assessment, patient has intact orientation to person place and time, attention that is fair as evident by conversation and not by testing as patient declined these, and good short-term and good long-term memory as evident by recalling 3 of 3 words in 5 minutes and remembering details from months ago.  Cognitively this appears to be improvement with patient compared to documentation earlier in hospitalization.  However, patient is not able to consistently answer questions around discharge planning including where he would live, what he would do in the event of certain medical situations, and how he would take care of self.  Seems that safe discharge for patient would involve having housing in place, close guardian and ACTT follow up, transport/coordination for appointment medically, and likely home health support.  This is not an assessment of patient's capacity (as patient is having competency evaluation that override any capacity discussions) but instead is objective assessment of patient cognition and ability to answer questions around discharge planning and assessment of some of the needs of patient for safe discharge.  The bigger issue at hand is patient was deemed to having lack of capacity and then was deemed to possibly lack competency for which evaluation was initiated and, in our opinion, would overrides any capacity discussions until competency is assessed by court. Consulted ethics, [name], and discussed case with her. [name] agrees, given the current open investigation and pending court date for competency evaluation, that we cannot discharge patient regardless of our capacity assessment. See ethics note for details. Discussed that patient's current mentation could be phenomena of waxing and waning and may not be true representation of patient's functioning.  [name] agreed that patient should remain in the hospital until competency evaluation and also agrees that  our team cannot provide re-evaluation that alone would be grounds for discharging patient.  This was discussed with the primary team and [name].  [name] requested that psychiatry reach out to guardian ad litem, [name], to discuss the situation.  Psychiatrically, pt has unclear past psychiatric history of primary psychotic disorder and is not currently on any psychiatric medications and has not been for several months.  He denies any symptoms psychosis including hallucinations or paranoia.  It appears that patient less likely has a primary psychotic disorder and may have been substance induced given lack of symptoms off medication and in controlled setting away from cocaine.  Would recommend continuing to avoid substances.  Would recommend patient continue with ACT team.  07/03/2024: Patient seen face to face in his hospital room. He is awake, alert and oriented x 3. This is a follow up consult due to patient experiencing active hallucinations, refusing meals and medications. During this encounter, patient reports he's been hearing multiple voices which comes and goes for the past few days, which are making derogatory remarks about him including you are worthless, go and kill yourself. However, patient states he does not have any plan to kill himself in the hospital but does not feel safe outside the hospital. When asked what he would do to harm himself outside  the hospital, patient indicates I don't know but reports previous history of trying to run in front of a moving vehicle and taking pills. Patient also reports feeling paranoid as if people are out to get him which makes him feels nervous and anxious sometimes. He appears hypervigilant on examination. When asked which medication worked for him in the past for psychosis, patient mentioned Clinical research associate, this Clinical research associate educated the patient that Xanax is only prescribed for temporary relief of anxiety and does not improve psychosis. As a result, this Clinical research associate  informed patient that records show that Latuda  was prescribed for him in the past for psychosis, and it would be great if he could try it again, and patient accepted to take it.   Collateral information from the treating nurse indicates patient has not been eating well for the past few days, with food found on his hospital room, she also reports patient has been endorsing visual and auditory hallucinations. However, she has not observed patient to be agitated, aggressive or disruptive in the unit.   Diagnoses:  Active Hospital problems: Principal Problem:   Acute CVA (cerebrovascular accident) [facility]) Active Problems:   Schizoaffective disorder, bipolar type (HCC)   Cocaine abuse (HCC)   Type 2 diabetes mellitus (HCC)   Prolonged QT interval   Acid reflux   History of DVT (deep vein thrombosis)   Status post bilateral BKA.   COPD (chronic obstructive pulmonary disease) (HCC)   Chronic systolic CHF (congestive heart failure) (HCC)   Benign essential HTN   LV (left ventricular) mural thrombus   Elevated liver enzymes   Dilated cardiomyopathy (HCC)   Impaired decision making    Plan   ## Psychiatric Medication Recommendations:  --Start Latuda  40 mg every morning with breakfast for psychosis-with plan to up titrated as needed. Please monitor Qtc.  --Continue Gabapentin  200 mg twice daily   ## Medical Decision Making Capacity: will not make an assessment on capacity in the setting of having pending competency evaluation by court, scheduled for Sept.   ## Further Work-up:  -- given long QT -> optimize K 4.0, Mg 2.0, and benefit-risk with all QT prolonging meds -- most recent EKG on 06/29/2024 had QtC of 511  -- Pertinent labwork reviewed earlier this admission   ## Disposition:-- There are no psychiatric contraindications to discharge at this time. Discharge is pending competency evaluation or other legally appropriate means of discharge. Please contact ethics team if discharge is  planned prior to competency evaluation.   ## Behavioral / Environmental: - No specific recommendations at this time.     ## Safety and Observation Level:  - Based on my clinical evaluation, I estimate the patient to be at low risk of self harm in the current setting. - At this time, we recommend  routine. This decision is based on my review of the chart including patient's history and current presentation, interview of the patient, mental status examination, and consideration of suicide risk including evaluating suicidal ideation, plan, intent, suicidal or self-harm behaviors, risk factors, and protective factors. This judgment is based on our ability to directly address suicide risk, implement suicide prevention strategies, and develop a safety plan while the patient is in the clinical setting. Please contact our team if there is a concern that risk level has changed.  CSSR Risk Category:C-SSRS RISK CATEGORY: No Risk  Suicide Risk Assessment: Patient has following modifiable risk factors for suicide: medication noncompliance, which we are addressing by competency evaluation to get pt guardian and access to  resources. Patient has following non-modifiable or demographic risk factors for suicide: male gender, history of suicide attempt, and psychiatric hospitalization Patient has the following protective factors against suicide: in controlled environment and not currently experiencing any acute psychiatric concerns.   Thank you for this consult request. Recommendations have been communicated to the primary team.  We will continue to follow up for now.     History of Present Illness  Relevant Aspects of [facility] Course:  Admitted on 03/04/2024 for strokelike symptoms.  Patient was diagnosed with left MCA territory stroke which involve numerous structures.  Patient had residual deficits including right-sided weakness and cognitive impairment.  Patient had been on treatment for the last  several months and per primary team was making some improvements.  Patient had been deemed incompetent by hospital team and was asked by court to be evaluated for competency.  Guardian ad litem reported that felt patient had capacity which prompted current evaluation.  Per primary team patient appears to be at or near his medical baseline.  Patient Report:  When asking patient what he would do if he were to hypothetically leave the hospital today, he reports he does not know.  When asking him where he would stay, he report that he knows people but on further questioning reports that he does not know and says that he would talk to [facility].  Patient reports that he had given his key to [name], nurse with ACT team, and currently is unaware if he has access to his apartment still.  Patient reports that he is followed by [facility] and they frequently follow him to check on his mental health and other aspects of his health care.  Asked patient if he will continue to use cocaine after discharge and he adamantly reports no.  Asked patient what he would do if he had any medical concerns, and he reported he would call out for help.  Following this discussion, patient reports that he could stay at a halfway house and therapy people there for him to get help from if he has any medical concerns.  Talked to patient about ADLs, and he reports that he is able to feed himself, use the bathroom by himself, and shower himself.  He then reports I cannot do everything myself.  When discussing who has helped in the past patient reports [name], [facility].  Asked patient if he had any family members or friends that he speaks with, and he reports he does not.  Asked patient if he could make the decision of leaving today and taking care of himself or waiting in the hospital until court evaluation and getting resources to help him function including group home, and he is unable to provide a meaningful answer  for this.  Psych ROS:  Depression: Does not describe depressive symptoms.  Denies suicidal thoughts. Anxiety: Did not describe significant anxiety symptoms. Mania (lifetime and current): Does not describe recent manic symptoms. Psychosis: (lifetime and current): Endorses some symptoms of psychosis outside of cocaine use in the past but denies any symptoms psychosis since hospitalization in April and being off of secondary medications and not using cocaine.  Collateral information:  [name], guardian ad litum, [phone]: Call and left HIPAA compliant voicemail.   Psychiatric and Social History  Psychiatric History:  Information collected from patient and chart  Prev Dx/Sx: Schizoaffective disorder Current Psych Provider: ACT team Home Meds (current): Abilify  5 mg twice a day and hydroxyzine  25 mg every 6 hours  as needed Previous Med Trials: Multiple trials Therapy: Currently followed up ACT team   Prior Psych Hospitalization: Last hospitalization was at [facility] in October 2024 Prior Self Harm: History of suicidal behavior Prior Violence: History of agitation and carrying knife/scapel   Family Psych History: Unknown Family Hx suicide: Unknown  Social History:  Not discussed during this encounter.  Not much information per chart review.  Patient did appear to live at her apartment alone prior to current admission.  Patient is followed by ACT team. Access to weapons/lethal means: Denies  Substance History Cocaine use disorder and hx of rehab. Also history of alc use but w/o complications. No other pertinent substance hx.   Exam Findings  Physical Exam:  Vital Signs:  Temp:  [98.5 F (36.9 C)] 98.5 F (36.9 C) (08/03 0010) Pulse Rate:  [78-80] 80 (08/03 0826) Resp:  [18-19] 19 (08/03 0826) BP: (116-155)/(90-113) 116/90 (08/03 0826) SpO2:  [97 %-98 %] 98 % (08/03 0826) Blood pressure (!) 116/90, pulse 80, temperature 98.5 F (36.9 C), temperature source Oral, resp. rate  19, height 5' 9 (1.753 m), weight 61.7 kg, SpO2 98%. Body mass index is 20.09 kg/m.  Physical Exam Vitals and nursing note reviewed.  HENT:     Head: Normocephalic and atraumatic.  Pulmonary:     Effort: Pulmonary effort is normal.  Musculoskeletal:     Comments: Bilateral lower extremity amputations.  Neurological:     General: No focal deficit present.     Mental Status: He is alert.     Mental Status Exam: General Appearance: Casual and Fairly Groomed. Looks appropriate to age. Laying comfortably across the bed. Wearing casual clothing.   Orientation:  Full (Time, Place, and Person)  Memory:  Recent;   Good Remote;   Fair As evident by recalling 3 out of 3 objects in 5 minutes.  Remote intact as evident by remembering interviewer 4 months ago including her feel to practice and specifics about conversation at that time.  Concentration:  Concentration: Good  Recall:  Good  Attention  Fair, unable to formally test as patient declined.  Per conversation appears at least fair.  Eye Contact:  Good  Speech:  Normal Rate and Slurred. Frequent stuttering.  Language:  Fair  Volume:  Normal  Mood: I feel nervous sometimes  Affect:  Constricted.   Thought Process:  Does have some irrelevant content when asked direct questions.  Thought Content: Hallucinations and paranoid delusion.  Suicidal Thoughts:  No  Homicidal Thoughts:  No  Judgement:  Fair  Insight:  Fair  Psychomotor Activity:  Normal  Akathisia:  Negative  Fund of Knowledge:  Fair, lower end of normal. Unclear if chronic or since his CVA.       Assets:  Communication Skills Desire for Improvement Financial Resources/Insurance Leisure Time  Cognition: On brief assessment intact executive functioning.  ADL's:  Intact, subjective per patient; SNF per OT  AIMS (if indicated):        Other History   These have been pulled in through the EMR, reviewed, and updated if appropriate.  Family History:  The patient's  family history includes Drug abuse in his mother; Suicidality in his mother.  Medical History: Past Medical History:  Diagnosis Date   Asthma    Bipolar 1 disorder (HCC)    CHF (congestive heart failure) (HCC)    COPD (chronic obstructive pulmonary disease) (HCC)    Depression    Diabetes mellitus without complication (HCC)    GERD (gastroesophageal reflux  disease)    Homelessness    Hypertension    QT prolongation    Schizoaffective disorder [facility])     Surgical History: Past Surgical History:  Procedure Laterality Date   AMPUTATION Left 12/10/2022   Procedure: LEFT BELOW KNEE AMPUTATION;  Surgeon: Harden Jerona GAILS, MD;  [facility];  Service: Orthopedics;  Laterality: Left;   AMPUTATION TOE Bilateral 01/02/2021   Procedure: AMPUTATION TOE;  Surgeon: Gretel Ozell PARAS, DPM;  Location: [facility] OR;  Service: Podiatry;  Laterality: Bilateral;  Ok to leave patient on hospital bed   IR ANGIO INTRA EXTRACRAN SEL COM CAROTID INNOMINATE BILAT MOD SED  12/17/2020   IR ANGIO VERTEBRAL SEL VERTEBRAL BILAT MOD SED  12/17/2020   MULTIPLE EXTRACTIONS WITH ALVEOLOPLASTY N/A 03/25/2023   Procedure: IRRIGATION AND DEBRIDEMENT, MULTIPLE EXTRACTION WITH ALVEOLOPLASTY X 4 , EXTRACTIONS 4 THROUGH 13, 20 THROUGHT 29;  Surgeon: Joanette Soulier, DMD;  Location: [facility] OR;  Service: Oral Surgery;  Laterality: N/A;   RADIOLOGY WITH ANESTHESIA N/A 12/17/2020   Procedure: IR WITH ANESTHESIA;  Surgeon: Radiologist, Medication, MD;  Location: [facility] OR;  Service: Radiology;  Laterality: N/A;     Medications:   Current Facility-Administered Medications:    albuterol  (PROVENTIL ) (2.5 MG/3ML) 0.083% nebulizer solution 2.5 mg, 2.5 mg, Nebulization, Q6H PRN, Danton [name] DASEN, MD, 2.5 mg at 06/05/24 0840   carvedilol  (COREG  CR) 24 hr capsule 10 mg, 10 mg, Oral, Daily, Lenon Marien CROME, MD, 10 mg at 07/03/24 0912   feeding supplement (ENSURE ENLIVE / ENSURE PLUS) liquid 237 mL, 237 mL, Oral, BID BM PRN, Danton [name] DASEN, MD    furosemide  (LASIX ) tablet 40 mg, 40 mg, Oral, Daily, [place], Hao, [place], 40 mg at 07/01/24 1100   gabapentin  (NEURONTIN ) capsule 200 mg, 200 mg, Oral, BID, Jacquetta Sharlot GRADE, NP, 200 mg at 07/03/24 9086   hydrALAZINE  (APRESOLINE ) tablet 25 mg, 25 mg, Oral, Q6H PRN, Sundil, Subrina, MD   labetalol  (NORMODYNE ) injection 20 mg, 20 mg, Intravenous, Q3H PRN, Mansy, Jan A, MD, 20 mg at 05/24/24 2250   lactulose  (CHRONULAC ) 10 GM/15ML solution 20 g, 20 g, Oral, BID PRN, Akula, Vijaya, MD   lisinopril  (ZESTRIL ) tablet 40 mg, 40 mg, Oral, Daily, Odell Celinda Balo, MD, 40 mg at 07/03/24 9086   meclizine  (ANTIVERT ) tablet 25 mg, 25 mg, Oral, TID PRN, Sreeram, Narendranath, MD, 25 mg at 07/02/24 2101   morphine  (PF) 2 MG/ML injection 1 mg, 1 mg, Intravenous, Q12H PRN, Arlon Carliss ORN, DO   Oral care mouth rinse, 15 mL, Mouth Rinse, PRN, Regalado, Belkys A, MD   oxyCODONE  (Oxy IR/ROXICODONE ) immediate release tablet 10 mg, 10 mg, Oral, Q4H PRN, Danton [name] DASEN, MD, 10 mg at 06/30/24 1704   rivaroxaban  (XARELTO ) tablet 20 mg, 20 mg, Oral, QHS, Ogbata, Sylvester I, MD, 20 mg at 07/02/24 2100   rosuvastatin  (CRESTOR ) tablet 5 mg, 5 mg, Oral, QHS, Desai, Nikita S, MD, 5 mg at 07/02/24 2101   sodium chloride  0.9 % bolus 500 mL, 500 mL, Intravenous, Once, Calkins, Derek W, DO   spironolactone  (ALDACTONE ) tablet 25 mg, 25 mg, Oral, Daily, Pahwani, Ravi, MD, 25 mg at 07/01/24 1100  Allergies: Allergies  Allergen Reactions   Acetaminophen  Hives, Nausea And Vomiting, Rash and Anaphylaxis    PT states that if he takes this he is going to the hospital   Motrin  [Ibuprofen ] Anaphylaxis   Nsaids Anaphylaxis    PT states that if he takes this he is going to the hospital  Lyrica  Cr [Pregabalin  Er] Other (See Comments)    Dystonia Dysarthria    Peanut-Containing Drug Products Other (See Comments)    Pt denies allergy to peanut butter 03/02/2021    Jan DELENA Donath, MD  07/03/2024, 11:01 AM

## 2024-07-03 NOTE — Progress Notes (Signed)
 Patient continues in front of nurses station in wheelchair, offered snack or drink and refuses, still states he does not want to go to his room

## 2024-07-03 NOTE — Progress Notes (Signed)
 Patient has been out of the room in his wheelchair all this shift and from night shift; refusing to reenter the room. Patient stating, something isn't right and I know it. He just ate a piece of chicken and was drinking a coke at the nurses station. Staff observed him moving his head up and down then backwards with eyes looking to the left. He became unresponsive; lasted few seconds. No tremors observed. Staff assisted to put patient into his bed. Vitals obtained. Rapid present now. BP is low see graphics. MD coming to bedside. Orders received.  See Rapid note.

## 2024-07-03 NOTE — Progress Notes (Signed)
 Patient continues to remain in wheelchair in front of nurses station, has not went to bed all night, does not want to communicate with staff at this time, offered snack or drink

## 2024-07-03 NOTE — Progress Notes (Signed)
 Fully awake wants out of bed into his chair; asked me to check out the window to see if anyone is there. Transferred himself into the wheelchair; out in the hallway and will not return to the room again.  MD notified of paranoid behavior and prn orders received.

## 2024-07-03 NOTE — Progress Notes (Signed)
 Patient refusing to go have CT.  States that he does not need it.

## 2024-07-03 NOTE — Progress Notes (Signed)
 Requesting a room change; will seek open room on this unit when available.

## 2024-07-03 NOTE — Progress Notes (Signed)
 Awake all night and this morning. In his wheelchair in the hallway. Doesn't want to go back into the room.  Calm and cooperative with care.  Requested pain medication. Flat affect. Very quiet and not as outspoken as his baseline.  Not eating and not sleeping past 24 hours.  Will take meds with drink. Will allow assessment. Denies headache and reports only mild dizziness today.

## 2024-07-03 NOTE — Progress Notes (Signed)
 Patient continues in wheelchair and does not want to go to bed remains in wheelchair and has moved to front area of nurses station, states  I;m not going to room tonight I hear people talking to me in there

## 2024-07-03 NOTE — Significant Event (Addendum)
 Rapid Response Event Note   Reason for Call :  Sudden onset decreased LOC Patient was sitting in his wheelchair at the nurses station eating lunch when he suddenly slumped over and became minimally responsive. No shaking noted.   VS: BP 63/51, HR 88, RR 16, SpO2 93% on room air  Patient received Carvedilol  10mg  and Lisinopril  40mg  at 0912. He was started on Latuda  40mg  today, first dose was at 1135. Per RN patient has not slept in the last 24 hours.   Initial Focused Assessment:  Patient in room, in his wheelchair. Responds to painful stimuli and communicates with me. Without continued stimuli he falls back asleep. Skin is warm, dry, pink. Breathing is unlabored. Clear breath sounds. He denies lightheadedness or dizziness. He denies pain. Radial pulses 2+. He follows commands. Withdrawals and purposefully moves upper extremities equally. Face symmetrical. PERRLA, 2mm.   VS: T 98.70F, BP 79/63 (70), HR 82, RR 16, SpO2 98% on room air CBG: 148  Interventions:  -250cc IVF bolus x2, per MD -CT head, per MD -Cardiac monitoring  Plan of Care:  -Frequent VS checks -Head CT  Event Summary:  MD Notified: Dr. Darci Call Time: 1333 Arrival Time: 1338 End Time: 1420  [name] LITTIE Danker, RN

## 2024-07-03 NOTE — Progress Notes (Signed)
 Continue to monitor; patient sleeping; responding to voice and following simple commands.

## 2024-07-04 DIAGNOSIS — I10 Essential (primary) hypertension: Secondary | ICD-10-CM | POA: Diagnosis not present

## 2024-07-04 DIAGNOSIS — I5022 Chronic systolic (congestive) heart failure: Secondary | ICD-10-CM | POA: Diagnosis not present

## 2024-07-04 DIAGNOSIS — I639 Cerebral infarction, unspecified: Secondary | ICD-10-CM | POA: Diagnosis not present

## 2024-07-04 DIAGNOSIS — Z789 Other specified health status: Secondary | ICD-10-CM | POA: Diagnosis not present

## 2024-07-04 NOTE — Progress Notes (Signed)
 Progress Note   Patient: [name] FMW:[id number] DOB: 1966-03-11 DOA: 03/04/2024     122 DOS: the patient was seen and examined on 07/04/2024   Brief hospital course: [name] is a 58 year old male with medical history significant for COPD, cocaine abuse, presented to the ED 03/04/24 for slower speech, confusion and right-sided weakness, CT head showed left MCA infarct, CTA head and neck revealed an M2 occlusion on the left,   Echocardiogram revealed large mobile left ventricular thrombus on the left and right ventricular apical thrombus in the right, ejection fraction less than 20%.  Cardiology was consulted recommended DOAC.  PT evaluated patient and recommended skilled nursing facility placement.  Patient was found to lack medical decision capacity/status and not amenable to determining his discharge disposition or leave AGAINST MEDICAL ADVICE.  TOC is pursuing guardianship with APS as he is lacking medical decision capacity. He did complain of halluacinations, eating poor, refused medications, has insomnia.   Psychiatry 8/3 advised to start Latuda  for his hallucinations. Rapid response called due to altered mentation, BP lower side. Telemtry,  fluid bolus, CT head, labs ordered.    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  TEE was noted for large mobile left lower clot just under the mitral valve with an EF of 20% and a small right apical clot with reduced RV systolic function.  Continues on Xarelto .   Altered mentation-  Low Blood pressure noted. SBP<70, MAP ~60. Rapid response called. IV fluid bolus 250ml x 2 ordered. Caution with antihypertensive medications. CT head, labs ordered.  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   Hold Lasix , losartan , Coreg , Aldactone  due to low BP.   Continue Xarelto .    Schizoaffective disorder bipolar disorder Lacks medical capacity Psych recommended that the patient lacks medical capacity to make decisions if the patient attempts to leave AGAINST MEDICAL ADVICE he should be involuntarily committed.   Due to hallucinations, Psych followed 8/2 recommended Latuda  and await APS for guardianship, TOC in touch with APS regarding guardianship.  Court date for competency September first week.   Essential hypertension Hold Coreg , Lasix , losartan , Aldactone  due to his mentation and low BP.  Hyperlipidemia: Continue Crestor .   Diabetes mellitus type 2 A1c of 5.4. refuses blood sugar monitoring    Polysubstance abuse UDS was positive for cocaine and benzos. Discussed ill effects of illicit drugs.   Physical Deconditioning/right AKA and left BKA. Working on prosthesis   History of COPD Bronchodilators as needed   History of DVT On Xarelto  but frequently refuses   Acute headache Dizziness Headache better, dizziness improved with Meclizine . Need ENT eval as outpatient.   Disposition:  Pending court date for competency (sept 8th?). Case management on board.  Out of bed to chair. Incentive spirometry. Nursing supportive care. Fall, aspiration precautions. Diet:  Diet Orders (From admission, onward)     Start     Ordered   04/07/24 1155  Diet regular Fluid consistency: Thin  Diet effective now       Question:  Fluid consistency:  Answer:  Thin   04/07/24 1154           DVT prophylaxis:  rivaroxaban  (XARELTO ) tablet 20 mg  Level of care: Med-Surg   Code Status: Full Code  Subjective: Patient is seen and examined today morning. RN notified his BP lower side. He is able to answer me.  No overnight issues.  Physical Exam: Vitals:   07/03/24 1947 07/04/24 0335 07/04/24 0812 07/04/24 1000  BP: 124/86 (!) 146/92 (!) 77/69 (!) 139/122  Pulse: 89 (!) 102 79 91  Resp: 18 18    Temp:   98 F (36.7 C)   TempSrc:   Oral   SpO2: 94% 99% 97%    Weight:      Height:        General -  Middle aged African American male, no distress HEENT - PERRLA, EOMI, atraumatic head, non tender sinuses. Lung - Clear, basal rales, rhonchi, wheezes. Heart - S1, S2 heard, no murmurs, rubs, amputations b/l Abdomen - Soft, non tender, bowel sounds good Neuro - alert, awake and oriented, no new focal deficits. Skin - Warm and dry. Bilateral amputee uses wheel chair.  Data Reviewed:      Latest Ref Rng & Units 06/30/2024   10:37 AM 04/16/2024    8:42 AM 04/10/2024    7:29 AM  CBC  WBC 4.0 - 10.5 K/uL 6.1  5.5  5.9   Hemoglobin 13.0 - 17.0 g/dL 84.6  85.0  84.3   Hematocrit 39.0 - 52.0 % 45.4  44.4  46.8   Platelets 150 - 400 K/uL 203  163  196       Latest Ref Rng & Units 06/30/2024   10:37 AM 04/16/2024    8:42 AM 04/10/2024    7:29 AM  BMP  Glucose 70 - 99 mg/dL 94  892  83   BUN 6 - 20 mg/dL 18  18  21    Creatinine 0.61 - 1.24 mg/dL 8.66  9.08  8.96   Sodium 135 - 145 mmol/L 140  137  139   Potassium 3.5 - 5.1 mmol/L 4.2  4.2  4.6   Chloride 98 - 111 mmol/L 107  104  104   CO2 22 - 32 mmol/L 26  20  25    Calcium  8.9 - 10.3 mg/dL 9.2  9.5  9.3    No results found.  Disposition: Status is: Inpatient Remains inpatient appropriate because: mental status improvement, safe placement  Planned Discharge Destination: Barriers to discharge: APS guardianship      Time spent: 42 minutes  Author: Concepcion Riser, MD 07/04/2024 2:22 PM Secure chat 7am to 7pm For on call review [email or web address].

## 2024-07-04 NOTE — Plan of Care (Signed)
  Problem: Nutrition: Goal: Adequate nutrition will be maintained Outcome: Progressing   Problem: Coping: Goal: Level of anxiety will decrease Outcome: Progressing   Problem: Elimination: Goal: Will not experience complications related to bowel motility Outcome: Progressing Goal: Will not experience complications related to urinary retention Outcome: Progressing   Problem: Pain Managment: Goal: General experience of comfort will improve and/or be controlled Outcome: Progressing   Problem: Safety: Goal: Ability to remain free from injury will improve Outcome: Progressing   Problem: Skin Integrity: Goal: Risk for impaired skin integrity will decrease Outcome: Progressing

## 2024-07-04 NOTE — Progress Notes (Signed)
   07/04/24 2133  Vitals  BP (!) 191/115  MAP (mmHg) 136     Patient refusing PRN medications, states he will allow staff to recheck his BP in an hour and go from there. Will continue to monitor.   Update- pt has refused further vital signs to be obtained this shift. When RN tries to educate patient on importance, he just keeps saying no over and over again without letting nurse finish talking.

## 2024-07-04 NOTE — Progress Notes (Addendum)
 Patient sat in his wheel chair near the nurses station all night.  Patient ate a bowl of chicken noodle soup & a piece of cake. At about 0530, patient advised that he was going to his room to sleep.  RN noted at 0600 that patient is in his bed asleep.

## 2024-07-04 NOTE — Progress Notes (Signed)
 Occupational Therapy Treatment Patient Details Name: [name] MRN: [id number] DOB: December 27, 1965 Today's Date: 07/04/2024   History of present illness Patient is 58 y.o. male presented to ED on 03/04/24 with stroke like symptoms when caregiver found him with slurred speech and right sided weakness. Head CT reveals Acute left MCA territory infarct affecting the insula, caudate nucleus, lentiform nucleus and internal capsule. PMH significant of  Schizoaffective disorder, heart failure with reduced ejection fraction, pretension, DMII, COPD, s/p bilateral amputations, chronic pain, and polysubstance abuse.   OT comments  Patient continues to progress well with OT treatment with CGA to transfer to/from shower bench for safety with setup provided and supervision for safety with bathing seated on tub bench. Patient able to dress self seated on wheelchair and required assist to pull up shorts with patient clearing bottom with BUE. Discharge recommendations continue to be appropriate. Acute OT to continue to follow to address established goals to facilitate DC to next venue of care.        If plan is discharge home, recommend the following:  A little help with walking and/or transfers;A little help with bathing/dressing/bathroom;Supervision due to cognitive status;Direct supervision/assist for financial management;Direct supervision/assist for medications management   Equipment Recommendations  None recommended by OT    Recommendations for Other Services      Precautions / Restrictions Precautions Precautions: Fall Recall of Precautions/Restrictions: Impaired Precaution/Restrictions Comments: h/o BLE amputations Required Braces or Orthoses: Other Brace Other Brace: L prosthesis Restrictions Weight Bearing Restrictions Per Provider Order: No       Mobility Bed Mobility Overal bed mobility: Modified Independent             General bed mobility comments: OOB in wheelchair     Transfers Overall transfer level: Needs assistance Equipment used: None Transfers: Bed to chair/wheelchair/BSC            Lateral/Scoot Transfers: Contact guard assist General transfer comment: CGA for safety with shower bench transfers     Balance Overall balance assessment: Mild deficits observed, not formally tested Sitting-balance support: Bilateral upper extremity supported Sitting balance-Leahy Scale: Good                                     ADL either performed or assessed with clinical judgement   ADL Overall ADL's : Needs assistance/impaired         Upper Body Bathing: Supervision/ safety;Sitting Upper Body Bathing Details (indicate cue type and reason): in shower Lower Body Bathing: Supervison/ safety;Sitting/lateral leans Lower Body Bathing Details (indicate cue type and reason): in shower Upper Body Dressing : Set up;Sitting Upper Body Dressing Details (indicate cue type and reason): donned T-shirt Lower Body Dressing: Minimal assistance;Sitting/lateral leans Lower Body Dressing Details (indicate cue type and reason): assistance pulling up pants while in the chair and patient using UEs to clear bottom         Tub/ Shower Transfer: Contact guard assist;Tub bench Tub/Shower Transfer Details (indicate cue type and reason): CGA for safety with lateral scoot transfers to tub bench        Extremity/Trunk Assessment              Vision       Perception     Praxis     Communication Communication Communication: Other (comment) (word finding difficulties) Factors Affecting Communication: Difficulty expressing self;Reduced clarity of speech   Cognition Arousal: Alert Behavior During Therapy: [facility] for tasks  assessed/performed Cognition: Cognition impaired Difficult to assess due to: Impaired communication   Awareness: Intellectual awareness intact, Online awareness impaired   Attention impairment (select first level of  impairment): Selective attention   OT - Cognition Comments: improvement with problem solving                 Following commands: Intact Following commands impaired: Follows one step commands inconsistently, Follows one step commands with increased time      Cueing   Cueing Techniques: Verbal cues, Visual cues  Exercises      Shoulder Instructions       General Comments      Pertinent Vitals/ Pain       Pain Assessment Pain Assessment: Faces Faces Pain Scale: No hurt Pain Intervention(s): Monitored during session  Home Living                                          Prior Functioning/Environment              Frequency  Min 1X/week        Progress Toward Goals  OT Goals(current goals can now be found in the care plan section)  Progress towards OT goals: Progressing toward goals  Acute Rehab OT Goals Patient Stated Goal: to get better OT Goal Formulation: With patient Time For Goal Achievement: 07/14/24 Potential to Achieve Goals: Fair ADL Goals Pt Will Perform Lower Body Bathing: with mod assist;sitting/lateral leans Pt Will Perform Lower Body Dressing: with modified independence;sitting/lateral leans;bed level Pt Will Transfer to Toilet: with modified independence;bedside commode (drop arm BSC, no cues for safety) Pt Will Perform Toileting - Clothing Manipulation and hygiene: with mod assist;sitting/lateral leans Pt Will Perform Tub/Shower Transfer: Tub transfer;tub bench;with modified independence Pt/caregiver will Perform Home Exercise Program: Right Upper extremity;Independently;With written HEP provided (to increase coordination with use of RUE) Additional ADL Goal #1: Pt will transfer to w/c with Mod I as a precursor for OOB ADLs Additional ADL Goal #2: Pt will utilize RUE to gather 5/5 objects without overshooting. Additional ADL Goal #3: Pt will demonstrate proper safety with transfers/mobility 100% of the time during  session. Additional ADL Goal #4: Pt will demonstrate appropriate safety awareness 100% of the time when positioning w/c to transfer  Plan      Co-evaluation                 AM-PAC OT 6 Clicks Daily Activity     Outcome Measure   Help from another person eating meals?: None Help from another person taking care of personal grooming?: A Little Help from another person toileting, which includes using toliet, bedpan, or urinal?: A Little Help from another person bathing (including washing, rinsing, drying)?: A Little Help from another person to put on and taking off regular upper body clothing?: A Little Help from another person to put on and taking off regular lower body clothing?: A Little 6 Click Score: 19    End of Session Equipment Utilized During Treatment: Other (comment) (wheelchair and tub bench)  OT Visit Diagnosis: Unsteadiness on feet (R26.81);Other abnormalities of gait and mobility (R26.89);Muscle weakness (generalized) (M62.81);Other symptoms and signs involving the nervous system (R29.898);Other symptoms and signs involving cognitive function;Hemiplegia and hemiparesis Hemiplegia - Right/Left: Right Hemiplegia - dominant/non-dominant: Dominant Hemiplegia - caused by: Cerebral infarction   Activity Tolerance Patient tolerated treatment well   Patient Left in chair;with  call bell/phone within reach   Nurse Communication Mobility status        Time: 8855-8795 OT Time Calculation (min): 20 min  Charges: OT General Charges $OT Visit: 1 Visit OT Treatments $Self Care/Home Management : 8-22 mins  [name], OTA Acute Rehabilitation Services  Office [phone]   [name] 07/04/2024, 1:06 PM

## 2024-07-04 NOTE — Progress Notes (Signed)
 Patient refused EKG this morning.Marland KitchenMarland KitchenMarland Kitchen

## 2024-07-04 NOTE — Progress Notes (Signed)
 Physical Therapy Treatment Patient Details Name: [name] MRN: [id number] DOB: 26-Jul-1966 Today's Date: 07/04/2024   History of Present Illness Patient is 58 y.o. male presented to ED on 03/04/24 with stroke like symptoms when caregiver found him with slurred speech and right sided weakness. Head CT reveals Acute left MCA territory infarct affecting the insula, caudate nucleus, lentiform nucleus and internal capsule. PMH significant of  Schizoaffective disorder, heart failure with reduced ejection fraction, pretension, DMII, COPD, s/p bilateral amputations, chronic pain, and polysubstance abuse.    PT Comments  Continuing work on functional mobility and activity tolerance;  Session focused on assisting with having Daiton's RLE casted for a prosthesis; Met with Prosthetist, and cast was taken with [name] in sidelying (would need to tolerate standing longer to be able to cast standing in parallel bars); We briefly discussed likelihood that Shahzaib's LLE prothesis is likely needing to be rechecked for fit -- will consider sending it back to prosthetics office with Prosthetist next Monday;  Plan will be for socket checks (typically needs 2 checks before optimal fit), and to get RLE prosthesis made; Will need to ask Kirk what picture or art he would like to have on this prosthesis;  Have made plans for Prosthetist visits on Mondays at noon   If plan is discharge home, recommend the following: Supervision due to cognitive status;Direct supervision/assist for financial management;Direct supervision/assist for medications management;Assist for transportation   Can travel by private vehicle      (perhaps soon)  Equipment Recommendations  None recommended by PT    Recommendations for Other Services OT consult;Speech consult;Other (comment) (Prosthetist)     Precautions / Restrictions Precautions Precautions: Fall Recall of Precautions/Restrictions: Impaired Precaution/Restrictions Comments: h/o BLE  amputations Required Braces or Orthoses: Other Brace Other Brace: L prosthesis Restrictions Weight Bearing Restrictions Per Provider Order: No     Mobility  Bed Mobility               General bed mobility comments: OOB in wheelchair    Transfers Overall transfer level: Needs assistance Equipment used: None Transfers: Bed to chair/wheelchair/BSC            Lateral/Scoot Transfers: Supervision General transfer comment: Transferred wc to mat table, then back to wheelchair after R cast was taken; this particualr mat table had a thicker mat that had more give, and Abigail had to make postural and hand placement adjustments to make the transfers; incr time, but Kamonte did not need physical assist; Closer guard for safety, given different nature of the transfer    Ambulation/Gait                   Psychologist, counselling mobility: Yes Wheelchair propulsion: Both upper extremities Wheelchair parts: Independent Distance:  (3W to elevator to 4th floor gym, and back)   Tilt Bed    Modified Rankin (Stroke Patients Only) Modified Rankin (Stroke Patients Only) Pre-Morbid Rankin Score: Slight disability Modified Rankin: Moderate disability     Balance     Sitting balance-Leahy Scale: Good                                      Communication Communication Communication: Other (comment) (word finding difficulties) Factors Affecting Communication: Difficulty expressing self;Reduced clarity of speech  Cognition Arousal: Alert Behavior During Therapy: [facility] for tasks assessed/performed  PT - Cognition Comments: When asked if he felt OK enough to meet with porsthetist for RLE casting, replied Let's get this done Following commands: Intact      Cueing Cueing Techniques: Verbal cues, Visual cues  Exercises      General Comments General comments (skin  integrity, edema, etc.): tolerated L sidelying for RLE casting by CPO; some fatigue with R hip abduction against gravity during casting and this PT gave physical assist as needed      Pertinent Vitals/Pain Pain Assessment Pain Assessment: Faces Faces Pain Scale: No hurt Pain Intervention(s): Monitored during session    Home Living                          Prior Function            PT Goals (current goals can now be found in the care plan section) Acute Rehab PT Goals Patient Stated Goal: Agreeable to working on prosthesis training PT Goal Formulation: With patient Time For Goal Achievement: 07/12/24 Potential to Achieve Goals: Fair Progress towards PT goals: Progressing toward goals    Frequency    Min 2X/week      PT Plan      Co-evaluation              AM-PAC PT 6 Clicks Mobility   Outcome Measure  Help needed turning from your back to your side while in a flat bed without using bedrails?: None Help needed moving from lying on your back to sitting on the side of a flat bed without using bedrails?: None Help needed moving to and from a bed to a chair (including a wheelchair)?: A Little Help needed standing up from a chair using your arms (e.g., wheelchair or bedside chair)?: Total Help needed to walk in hospital room?: Total Help needed climbing 3-5 steps with a railing? : Total 6 Click Score: 14    End of Session   Activity Tolerance: Patient tolerated treatment well Patient left: Other (comment) (rolling with wheelchair in hallway) Nurse Communication: Mobility status PT Visit Diagnosis: Other abnormalities of gait and mobility (R26.89);Muscle weakness (generalized) (M62.81);Other symptoms and signs involving the nervous system (R29.898);Hemiplegia and hemiparesis Hemiplegia - Right/Left: Right Hemiplegia - dominant/non-dominant: Dominant Hemiplegia - caused by: Cerebral infarction     Time: 1200-1230 PT Time Calculation (min) (ACUTE  ONLY): 30 min  Charges:    $Therapeutic Activity: 23-37 mins PT General Charges $$ ACUTE PT VISIT: 1 Visit                     [name], PT  Acute Rehabilitation Services Office [phone] Secure Chat welcomed    [name] 07/04/2024, 6:21 PM

## 2024-07-04 NOTE — Consult Note (Addendum)
 [facility] Consult Follow-up  Patient Name: Claire Bridge  MRN: [id number]  DOB: 1966/07/26  Consult Order details:  Orders (From admission, onward)     Start     Ordered   06/28/24 1642  IP CONSULT TO PSYCHIATRY       Ordering Provider: Elpidio Reyes DEL, MD  Provider:  (Not yet assigned)  Question Answer Comment  Location [facility]   Reason for Consult? Question about improved capacity now that patient has been receiving treatment in house.  Patient's attorney requested repeat psych consult for possible discharge planning      06/28/24 1642   04/05/24 1343  IP CONSULT TO PSYCHIATRY       Ordering Provider: Judeth Trenda BIRCH, MD  Provider:  (Not yet assigned)  Question Answer Comment  Location [facility]   Reason for Consult? Acute stroke, embolic from large intracardiac clots, refusing Eliquis .  Schizoaffective d/o, lacks MDM capacity, has consistently refused to take Latuda .  ?  parenteral/IM, possibly long-acting [facility] medications.      04/05/24 1346   03/30/24 1323  IP CONSULT TO PSYCHIATRY       Comments: 22M, lived alone, B/L amputee, wheelchair, P/W acute left MCA stroke, large cardiac thrombus, through course of hospital admission with issues of agitation, refusal of care, refusing SNF, felt unsafe to DC home and documentation regarding lack of capacity (however did not specifically see this addressed in multiple psychiatry evaluations), currently medically stable awaiting disposition. APS contacted.  Kindly refer to [facility] MDs note from 4/30 regarding need for capacity evaluation.  Ordering Provider: Judeth Trenda BIRCH, MD  Provider:  (Not yet assigned)  Question Answer Comment  Location [facility]   Reason for Consult? Kindly assist with capacity evaluation> details in comments.      03/30/24 1323   03/14/24 1515  IP CONSULT TO PSYCHIATRY       Ordering Provider: Elpidio Reyes DEL, MD  Provider:  (Not yet  assigned)  Question Answer Comment  Location [facility]   Reason for Consult? Capacity evaluation -- patient verbally abusive, wants to go home, but oriented only to self.  Lives alone, seems to be unable to care for himself at home.  Essentially refusing all cares here      03/14/24 1514             Mode of Visit: In person    Psychiatry Consult Evaluation  Service Date: July 04, 2024 LOS:  LOS: 122 days  Chief Complaint I have been hearing voices telling me to kill myself.   Primary Psychiatric Diagnoses  Hx Schizoaffective disorder unspecified versus hx Substance induced psychosis/mood disorder versus hx MDD 2.  Stimulant use disorder (cocaine), in controlled environment  Assessment  Lazar Tierce is a 58 y.o. male admitted: Medically on 03/04/2024  2:31 PM for residual stroke with prolonged hospitalization due to inability to care for self. 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   Seen numerous times by psychiatry at this point. Case summary and conceptualization:  Middle age male with significant substance use issues (possible schizoaffective disorder), apparent medication non-compliance, and severe medical problems (HF w EF < 20 and repeat CVA's with large current LA thrombus). Psychiatry consulted recently for capacity eval.  Patient demonstrated fair cognition during the eval but it was decided that since the patient has been appointed a guardian ad litem and has an upcoming competency hearing, he can not be determined to have capacity to leave the hospital (at least at that time on 7/31). Psychiatry signed off.   Psychiatry consulted again on 8/3 to address medication non-compliance. There was concern noted  for psychotic symptoms. Started on Latuda  40 mg daily by Dr. Sable covering the consult service.    07/04/24:  Rapid response called on pt yesterday for sudden slumping over with minimal responsiveness without noted shaking. BP was 63/51, fluids, CT head and cardiac monitoring ordered. Pt was fully alert within the next 2 hours, and refused CT. EKG was also refused this morning. Pt may not be eating all of his food, possibly interfering with the absorption of Latuda  which requires 350cal with intake for proper absorption. Pt presented this morning alert and oriented and did not show any overt signs of psychosis. Denies SI, HI, A&VH. Will not change medications at this time. Will follow up tomorrow and read nursing notes for further signs of paranoia.  Diagnoses:  Active Hospital problems: Principal Problem:   Acute CVA (cerebrovascular accident) [facility]) Active Problems:   Schizoaffective disorder, bipolar type (HCC)   Cocaine abuse (HCC)   Type 2 diabetes mellitus (HCC)   Prolonged QT interval   Acid reflux   History of DVT (deep vein thrombosis)   Status post bilateral BKA.   COPD (chronic obstructive pulmonary disease) (HCC)   Chronic systolic CHF (congestive heart failure) (HCC)   Benign essential HTN   LV (left ventricular) mural thrombus   Elevated liver enzymes   Dilated cardiomyopathy (HCC)   Impaired decision making    Plan   ## Psychiatric Medication Recommendations:  --Continue Latuda  40 mg every morning with breakfast for psychosis-with plan to up titrated as needed. --Continue Gabapentin  200 mg twice daily   ## Medical Decision Making Capacity: will not make an assessment on capacity in the setting of having pending competency evaluation by court, scheduled for Sept.   ## Further Work-up:  -- Latuda  has very low propensity to prolong QT -- given long QT -> optimize K 4.0, Mg 2.0, and benefit-risk with all QT prolonging meds -- most recent EKG on 06/29/2024 had QtC of  511  -- Pertinent labwork reviewed earlier this admission   ## Disposition:-- There are no psychiatric contraindications to discharge at this time. Discharge is pending competency evaluation or other legally appropriate means of discharge. Please contact ethics team if discharge is planned prior to competency evaluation.   ## Behavioral / Environmental: - No specific recommendations at this time.     ## Safety and Observation Level:  - Based on my clinical evaluation, I estimate the patient to be at low risk of self harm in the current setting. - At this time, we recommend  routine. This decision is based on my review of the chart including patient's history and current presentation, interview of the patient, mental status examination, and consideration of suicide risk including evaluating suicidal ideation, plan, intent, suicidal or self-harm behaviors, risk factors, and protective factors. This judgment is based on our ability to directly address suicide risk, implement suicide prevention strategies, and develop a safety plan while the patient is in the clinical setting. Please contact our team if there is a concern that risk level has changed.  CSSR Risk Category:C-SSRS RISK CATEGORY: No Risk  Suicide Risk Assessment: Patient has  following modifiable risk factors for suicide: medication noncompliance, which we are addressing by competency evaluation to get pt guardian and access to resources. Patient has following non-modifiable or demographic risk factors for suicide: male gender, history of suicide attempt, and psychiatric hospitalization Patient has the following protective factors against suicide: in controlled environment and not currently experiencing any acute psychiatric concerns.   Thank you for this consult request. Recommendations have been communicated to the primary team.  We will continue to follow up for now.     History of Present Illness  Relevant Aspects of [facility] Course:  Admitted on 03/04/2024 for strokelike symptoms.  Patient was diagnosed with left MCA territory stroke which involve numerous structures.  Patient had residual deficits including right-sided weakness and cognitive impairment.  Patient had been on treatment for the last several months and per primary team was making some improvements.  Patient had been deemed incompetent by hospital team and was asked by court to be evaluated for competency.  Guardian ad litem reported that felt patient had capacity which prompted current evaluation.  Per primary team patient appears to be at or near his medical baseline.  Patient Report:  When asking patient what he would do if he were to hypothetically leave the hospital today, he reports he does not know.  When asking him where he would stay, he report that he knows people but on further questioning reports that he does not know and says that he would talk to [facility].  Patient reports that he had given his key to [name], nurse with ACT team, and currently is unaware if he has access to his apartment still.  Patient reports that he is followed by [facility] and they frequently follow him to check on his mental health and other aspects of his health care.  Asked patient if he will continue to use cocaine after discharge and he adamantly reports no.  Asked patient what he would do if he had any medical concerns, and he reported he would call out for help.  Following this discussion, patient reports that he could stay at a halfway house and therapy people there for him to get help from if he has any medical concerns.  Talked to patient about ADLs, and he reports that he is able to feed himself, use the bathroom by himself, and shower himself.  He then reports I cannot do everything myself.  When discussing who has helped in the past patient reports [name], [facility].  Asked patient if he had any family members or friends that he speaks  with, and he reports he does not.  Asked patient if he could make the decision of leaving today and taking care of himself or waiting in the hospital until court evaluation and getting resources to help him function including group home, and he is unable to provide a meaningful answer for this.  Psych ROS:  Depression: Does not describe depressive symptoms.  Denies suicidal thoughts. Anxiety: Did not describe significant anxiety symptoms. Mania (lifetime and current): Does not describe recent manic symptoms. Psychosis: (lifetime and current): Endorses some symptoms of psychosis outside of cocaine use in the past but denies any symptoms psychosis since hospitalization in April and being off of secondary medications and not using cocaine.  Collateral information:  [name], guardian ad litum, [phone]: Call and left HIPAA compliant voicemail.   Psychiatric and Social History  Psychiatric History:  Information collected from patient and chart  Prev Dx/Sx: Schizoaffective  disorder Current Psych Provider: ACT team Home Meds (current): Abilify  5 mg twice a day and hydroxyzine  25 mg every 6 hours as needed Previous Med Trials: Multiple trials Therapy: Currently followed up ACT team   Prior Psych Hospitalization: Last hospitalization was at [facility] in October 2024 Prior Self Harm: History of suicidal behavior Prior Violence: History of agitation and carrying knife/scapel   Family Psych History: Unknown Family Hx suicide: Unknown  Social History:  Not discussed during this encounter.  Not much information per chart review.  Patient did appear to live at her apartment alone prior to current admission.  Patient is followed by ACT team. Access to weapons/lethal means: Denies  Substance History Cocaine use disorder and hx of rehab. Also history of alc use but w/o complications. No other pertinent substance hx.   Exam Findings  Physical Exam:  Vital Signs:  Temp:  [98 F (36.7 C)-98.3  F (36.8 C)] 98 F (36.7 C) (08/04 0812) Pulse Rate:  [74-102] 79 (08/04 0812) Resp:  [16-24] 18 (08/04 0335) BP: (63-146)/(49-92) 77/69 (08/04 0812) SpO2:  [87 %-99 %] 97 % (08/04 0812) Blood pressure (!) 77/69, pulse 79, temperature 98 F (36.7 C), temperature source Oral, resp. rate 18, height 5' 9 (1.753 m), weight 61.7 kg, SpO2 97%. Body mass index is 20.09 kg/m.  Physical Exam Vitals and nursing note reviewed.  HENT:     Head: Normocephalic and atraumatic.  Pulmonary:     Effort: Pulmonary effort is normal.  Musculoskeletal:     Comments: Bilateral lower extremity amputations.  Neurological:     General: No focal deficit present.     Mental Status: He is alert.     Mental Status Exam: General Appearance: Casual and Fairly Groomed.   Orientation:  Full (Time, Place, and Person)  Memory:  Recent;   Good Remote;   Fair  Concentration:  Concentration: Good  Recall:  Good  Attention  Fair  Eye Contact:  Good  Speech:  Normal Rate and Slurred.  Language:  Fair  Volume:  Normal  Mood: okay  Affect:  Constricted.   Thought Process:  Does have some irrelevant content when asked direct questions.  Thought Content: Hallucinations and paranoid delusion.  Suicidal Thoughts:  No  Homicidal Thoughts:  No  Judgement:  Fair  Insight:  Fair  Psychomotor Activity:  Normal  Akathisia:  Negative  Fund of Knowledge:  Fair, lower end of normal. Unclear if chronic or since his CVA.       Assets:  Communication Skills Desire for Improvement Financial Resources/Insurance Leisure Time  Cognition: On brief assessment intact executive functioning.  ADL's:  Intact, subjective per patient; SNF per OT  AIMS (if indicated):        Other History   These have been pulled in through the EMR, reviewed, and updated if appropriate.  Family History:  The patient's family history includes Drug abuse in his mother; Suicidality in his mother.  Medical History: Past Medical History:   Diagnosis Date   Asthma    Bipolar 1 disorder (HCC)    CHF (congestive heart failure) (HCC)    COPD (chronic obstructive pulmonary disease) (HCC)    Depression    Diabetes mellitus without complication (HCC)    GERD (gastroesophageal reflux disease)    Homelessness    Hypertension    QT prolongation    Schizoaffective disorder [facility])     Surgical History: Past Surgical History:  Procedure Laterality Date   AMPUTATION Left 12/10/2022   Procedure:  LEFT BELOW KNEE AMPUTATION;  Surgeon: Harden Jerona GAILS, MD;  Location[facility] OR;  Service: Orthopedics;  Laterality: Left;   AMPUTATION TOE Bilateral 01/02/2021   Procedure: AMPUTATION TOE;  Surgeon: Gretel Ozell PARAS, DPM;  Location: [facility] OR;  Service: Podiatry;  Laterality: Bilateral;  Ok to leave patient on hospital bed   IR ANGIO INTRA EXTRACRAN SEL COM CAROTID INNOMINATE BILAT MOD SED  12/17/2020   IR ANGIO VERTEBRAL SEL VERTEBRAL BILAT MOD SED  12/17/2020   MULTIPLE EXTRACTIONS WITH ALVEOLOPLASTY N/A 03/25/2023   Procedure: IRRIGATION AND DEBRIDEMENT, MULTIPLE EXTRACTION WITH ALVEOLOPLASTY X 4 , EXTRACTIONS 4 THROUGH 13, 20 THROUGHT 29;  Surgeon: Joanette Soulier, DMD;  Location: [facility] OR;  Service: Oral Surgery;  Laterality: N/A;   RADIOLOGY WITH ANESTHESIA N/A 12/17/2020   Procedure: IR WITH ANESTHESIA;  Surgeon: Radiologist, Medication, MD;  Location: [facility] OR;  Service: Radiology;  Laterality: N/A;     Medications:   Current Facility-Administered Medications:    albuterol  (PROVENTIL ) (2.5 MG/3ML) 0.083% nebulizer solution 2.5 mg, 2.5 mg, Nebulization, Q6H PRN, Danton Reyes DASEN, MD, 2.5 mg at 06/05/24 0840   carvedilol  (COREG  CR) 24 hr capsule 10 mg, 10 mg, Oral, Daily, Lenon Marien CROME, MD, 10 mg at 07/03/24 0912   feeding supplement (ENSURE ENLIVE / ENSURE PLUS) liquid 237 mL, 237 mL, Oral, BID BM PRN, Danton Reyes DASEN, MD   furosemide  (LASIX ) tablet 40 mg, 40 mg, Oral, Daily, [place], Hao, [place], 40 mg at 07/01/24 1100   gabapentin  (NEURONTIN )  capsule 200 mg, 200 mg, Oral, BID, Jacquetta Sharlot GRADE, NP, 200 mg at 07/03/24 2102   hydrALAZINE  (APRESOLINE ) tablet 25 mg, 25 mg, Oral, Q6H PRN, Sundil, Subrina, MD   labetalol  (NORMODYNE ) injection 20 mg, 20 mg, Intravenous, Q3H PRN, Mansy, Jan A, MD, 20 mg at 05/24/24 2250   lactulose  (CHRONULAC ) 10 GM/15ML solution 20 g, 20 g, Oral, BID PRN, Akula, Vijaya, MD   lisinopril  (ZESTRIL ) tablet 40 mg, 40 mg, Oral, Daily, Odell Celinda Balo, MD, 40 mg at 07/03/24 0913   LORazepam  (ATIVAN ) tablet 1 mg, 1 mg, Oral, Q6H PRN, Sreeram, Narendranath, MD   lurasidone  (LATUDA ) tablet 40 mg, 40 mg, Oral, Q breakfast, Akintayo, Musa A, MD, 40 mg at 07/03/24 1135   meclizine  (ANTIVERT ) tablet 25 mg, 25 mg, Oral, TID PRN, Sreeram, Narendranath, MD, 25 mg at 07/02/24 2101   morphine  (PF) 2 MG/ML injection 1 mg, 1 mg, Intravenous, Q12H PRN, Arlon Carliss ORN, DO   Oral care mouth rinse, 15 mL, Mouth Rinse, PRN, Regalado, Belkys A, MD   oxyCODONE  (Oxy IR/ROXICODONE ) immediate release tablet 10 mg, 10 mg, Oral, Q4H PRN, Danton Reyes DASEN, MD, 10 mg at 06/30/24 1704   rivaroxaban  (XARELTO ) tablet 20 mg, 20 mg, Oral, QHS, Ogbata, Sylvester I, MD, 20 mg at 07/03/24 2102   rosuvastatin  (CRESTOR ) tablet 5 mg, 5 mg, Oral, QHS, Desai, Nikita S, MD, 5 mg at 07/03/24 2102   sodium chloride  0.9 % bolus 500 mL, 500 mL, Intravenous, Once, Calkins, Derek W, DO   spironolactone  (ALDACTONE ) tablet 25 mg, 25 mg, Oral, Daily, Pahwani, Ravi, MD, 25 mg at 07/01/24 1100  Allergies: Allergies  Allergen Reactions   Acetaminophen  Hives, Nausea And Vomiting, Rash and Anaphylaxis    PT states that if he takes this he is going to the hospital   Motrin  [Ibuprofen ] Anaphylaxis   Nsaids Anaphylaxis    PT states that if he takes this he is going to the hospital   Lyrica  Cr [Pregabalin  Er]  Other (See Comments)    Dystonia Dysarthria    Peanut-Containing Drug Products Other (See Comments)    Pt denies allergy to peanut butter 03/02/2021     Karleen Kaufmann, MD PGY-4

## 2024-07-05 DIAGNOSIS — I5022 Chronic systolic (congestive) heart failure: Secondary | ICD-10-CM | POA: Diagnosis not present

## 2024-07-05 DIAGNOSIS — Z8659 Personal history of other mental and behavioral disorders: Secondary | ICD-10-CM | POA: Diagnosis not present

## 2024-07-05 DIAGNOSIS — Z789 Other specified health status: Secondary | ICD-10-CM | POA: Diagnosis not present

## 2024-07-05 DIAGNOSIS — I10 Essential (primary) hypertension: Secondary | ICD-10-CM | POA: Diagnosis not present

## 2024-07-05 DIAGNOSIS — I639 Cerebral infarction, unspecified: Secondary | ICD-10-CM | POA: Diagnosis not present

## 2024-07-05 DIAGNOSIS — F1410 Cocaine abuse, uncomplicated: Secondary | ICD-10-CM | POA: Diagnosis not present

## 2024-07-05 NOTE — Progress Notes (Signed)
 Pt did agree to take his meds after speaking with the dr.  He would not let RN place tele, and refused lab to draw blood after two attempts.

## 2024-07-05 NOTE — Progress Notes (Signed)
 [name] will not let me do an assessment, take meds, VS , or place telemetry as ordered this morning. He said I have a lot on my mind and do not want anyone to bother me.  I need time alone.  He also verbalized that he was worried about talking with psychiatrist today.  He feels people are lying on him.  MD notified of refusal of care.

## 2024-07-05 NOTE — Progress Notes (Signed)
 Patient refusing vital signs to be obtained. Patient stated I've already had them checked for the day, I'm good. Continues to refuse telemetry as well. Dr. Franky notified of both refusals.

## 2024-07-05 NOTE — Progress Notes (Signed)
 Progress Note   Patient: [name] FMW:[id number] DOB: November 01, 1966 DOA: 03/04/2024     123 DOS: the patient was seen and examined on 07/05/2024   Brief hospital course: [name] is a 58 year old male with medical history significant for COPD, cocaine abuse, presented to the ED 03/04/24 for slower speech, confusion and right-sided weakness, CT head showed left MCA infarct, CTA head and neck revealed an M2 occlusion on the left,   Echocardiogram revealed large mobile left ventricular thrombus on the left and right ventricular apical thrombus in the right, ejection fraction less than 20%.  Cardiology was consulted recommended DOAC.  PT evaluated patient and recommended skilled nursing facility placement.  Patient was found to lack medical decision capacity/status and not amenable to determining his discharge disposition or leave AGAINST MEDICAL ADVICE.  TOC is pursuing guardianship with APS as he is lacking medical decision capacity. He did complain of halluacinations, eating poor, refused medications, has insomnia.   Psychiatry 8/3 advised to start Latuda  for his hallucinations. Rapid response called due to altered mentation, BP lower side. Telemetry,  fluid bolus, CT head, labs ordered. 8/4 mental status back to baseline. He refused CT head. 8/5 continues to ambulate with wheel chair. Refuses vitals, meds, labs.     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  TEE was noted for large mobile left lower clot just under the mitral valve with an EF of 20% and a small right apical clot with reduced RV systolic function.  Continues on Xarelto .   Altered mentation-  Low Blood pressure noted. SBP<70, MAP ~60. 8/3 Rapid response called. IV fluid bolus 250ml x 2 ordered. BP improved, he refused CT head. Mental status back to baseline.   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   Hold Lasix , losartan , Coreg , Aldactone  due to low BP.   Continue Xarelto .   Schizoaffective disorder bipolar disorder Lacks medical capacity Psych recommended that the patient lacks medical capacity to make decisions if the patient attempts to leave AGAINST MEDICAL ADVICE he should be involuntarily committed.   Due to hallucinations, Psych followed 8/2 recommended Latuda  and await APS for guardianship, TOC in touch with APS regarding guardianship.  Court date for competency September first week.   Essential hypertension Hold Coreg , Lasix , losartan , Aldactone  due to his mentation and low BP.  Hyperlipidemia: Continue Crestor .   Diabetes mellitus type 2 A1c of 5.4. refuses blood sugar monitoring    Polysubstance abuse UDS was positive for cocaine and benzos. Discussed ill effects of illicit drugs.   Physical Deconditioning/right AKA and left BKA. Working on prosthesis   History of COPD Bronchodilators as needed   History of DVT On Xarelto  but frequently refuses   Acute headache Dizziness Headache better, dizziness improved with Meclizine . Need ENT eval as outpatient.   Disposition:  Pending court date for competency (sept 8th?). Case management on board.  Out of bed to chair. Incentive spirometry. Nursing supportive care. Fall, aspiration precautions. Diet:  Diet Orders (From admission, onward)     Start     Ordered   04/07/24 1155  Diet regular Fluid consistency: Thin  Diet effective now       Question:  Fluid consistency:  Answer:  Thin   04/07/24 1154           DVT prophylaxis:  rivaroxaban  (XARELTO ) tablet 20 mg  Level of care: Med-Surg  Code Status: Full Code  Subjective: Patient is seen and examined today morning. RN notified he refuses to take meds, vitals, later agreed. He is able to answer me. Denies any complaints. Advised  to cooperate with nursing staff.  Physical Exam: Vitals:    07/04/24 1000 07/04/24 1549 07/04/24 1956 07/04/24 2133  BP: (!) 139/122 121/82 (!) 168/133 (!) 191/115  Pulse: 91 97 (!) 101   Resp:  16 17   Temp:      TempSrc:      SpO2:  98% 98%   Weight:      Height:        General -  Middle aged African American male, no distress HEENT - PERRLA, EOMI, atraumatic head, non tender sinuses. Lung - Clear, basal rales, rhonchi, wheezes. Heart - S1, S2 heard, no murmurs, rubs, amputations b/l Abdomen - Soft, non tender, bowel sounds good Neuro - alert, awake and oriented, no new focal deficits. Skin - Warm and dry. Bilateral amputee uses wheel chair.  Data Reviewed:      Latest Ref Rng & Units 06/30/2024   10:37 AM 04/16/2024    8:42 AM 04/10/2024    7:29 AM  CBC  WBC 4.0 - 10.5 K/uL 6.1  5.5  5.9   Hemoglobin 13.0 - 17.0 g/dL 84.6  85.0  84.3   Hematocrit 39.0 - 52.0 % 45.4  44.4  46.8   Platelets 150 - 400 K/uL 203  163  196       Latest Ref Rng & Units 06/30/2024   10:37 AM 04/16/2024    8:42 AM 04/10/2024    7:29 AM  BMP  Glucose 70 - 99 mg/dL 94  892  83   BUN 6 - 20 mg/dL 18  18  21    Creatinine 0.61 - 1.24 mg/dL 8.66  9.08  8.96   Sodium 135 - 145 mmol/L 140  137  139   Potassium 3.5 - 5.1 mmol/L 4.2  4.2  4.6   Chloride 98 - 111 mmol/L 107  104  104   CO2 22 - 32 mmol/L 26  20  25    Calcium  8.9 - 10.3 mg/dL 9.2  9.5  9.3    No results found.  Disposition: Status is: Inpatient Remains inpatient appropriate because: safe placement, pending court care for guardianship.  Planned Discharge Destination: Barriers to discharge: APS guardianship      Time spent: 40 minutes  Author: Concepcion Riser, MD 07/05/2024 1:51 PM Secure chat 7am to 7pm For on call review [email or web address].

## 2024-07-05 NOTE — Consult Note (Incomplete)
 [place] [facility] Consult Follow-up  Patient Name: [name]  MRN: [id number]  DOB: 1966/07/14  Consult Order details:  Orders (From admission, onward)     Start     Ordered   06/28/24 1642  IP CONSULT TO PSYCHIATRY       Ordering Provider: Elpidio Reyes DEL, MD  Provider:  (Not yet assigned)  Question Answer Comment  Location [facility]   Reason for Consult? Question about improved capacity now that patient has been receiving treatment in house.  Patient's attorney requested repeat psych consult for possible discharge planning      06/28/24 1642   04/05/24 1343  IP CONSULT TO PSYCHIATRY       Ordering Provider: Judeth Trenda BIRCH, MD  Provider:  (Not yet assigned)  Question Answer Comment  Location [facility]   Reason for Consult? Acute stroke, embolic from large intracardiac clots, refusing Eliquis .  Schizoaffective d/o, lacks MDM capacity, has consistently refused to take Latuda .  ?  parenteral/IM, possibly long-acting [facility] medications.      04/05/24 1346   03/30/24 1323  IP CONSULT TO PSYCHIATRY       Comments: 83M, lived alone, B/L amputee, wheelchair, P/W acute left MCA stroke, large cardiac thrombus, through course of hospital admission with issues of agitation, refusal of care, refusing SNF, felt unsafe to DC home and documentation regarding lack of capacity (however did not specifically see this addressed in multiple psychiatry evaluations), currently medically stable awaiting disposition. APS contacted.  Kindly refer to [facility] MDs note from 4/30 regarding need for capacity evaluation.  Ordering Provider: Judeth Trenda BIRCH, MD  Provider:  (Not yet assigned)  Question Answer Comment  Location [facility]   Reason for Consult? Kindly assist with capacity evaluation> details in comments.      03/30/24 1323   03/14/24 1515  IP CONSULT TO PSYCHIATRY       Ordering Provider: Elpidio Reyes DEL, MD  Provider:  (Not yet  assigned)  Question Answer Comment  Location [facility]   Reason for Consult? Capacity evaluation -- patient verbally abusive, wants to go home, but oriented only to self.  Lives alone, seems to be unable to care for himself at home.  Essentially refusing all cares here      03/14/24 1514             Mode of Visit: In person    Psychiatry Consult Evaluation  Service Date: July 05, 2024 LOS:  LOS: 123 days  Chief Complaint I have been hearing voices telling me to kill myself.   Primary Psychiatric Diagnoses  Hx Schizoaffective disorder unspecified versus hx Substance induced psychosis/mood disorder versus hx MDD 2.  Stimulant use disorder (cocaine), in controlled environment  Assessment  [name] is a 58 y.o. male admitted: Medically on 03/04/2024  2:31 PM for residual stroke with prolonged hospitalization due to inability to care for self. 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   Seen numerous times by psychiatry at this point. Case summary and conceptualization:  Middle age male with significant substance use issues (possible schizoaffective disorder), apparent medication non-compliance, and severe medical problems (HF w EF < 20 and repeat CVA's with large current LA thrombus). Psychiatry consulted recently for capacity eval.  Patient demonstrated fair cognition during the eval but it was decided that since the patient has been appointed a guardian ad litem and has an upcoming competency hearing, he can not be determined to have capacity to leave the hospital (at least at that time on 7/31). Psychiatry signed off.   Psychiatry consulted again on 8/3 to address medication non-compliance. There was concern noted  for psychotic symptoms. Started on Latuda  40 mg daily by Dr. Sable covering the consult service.   07/05/24:  Yesterday patient refused to take his blood pressure medications, and was initially resistant this morning, but did end up taking them. Is still not cooperating well with staff (refusing telemetry and blood draws), and has been worried about talking with psychiatry. During the visit ***   07/04/24:  Rapid response called on pt yesterday for sudden slumping over with minimal responsiveness without noted shaking. BP was 63/51, fluids, CT head and cardiac monitoring ordered. Pt was fully alert within the next 2 hours, and refused CT. EKG was also refused this morning. Pt may not be eating all of his food, possibly interfering with the absorption of Latuda  which requires 350cal with intake for proper absorption. Pt presented this morning alert and oriented and did not show any overt signs of psychosis. Denies SI, HI, A&VH. Will not change medications at this time. Will follow up tomorrow and read nursing notes for further signs of paranoia.  Diagnoses:  Active Hospital problems: Principal Problem:   Acute CVA (cerebrovascular accident) [facility]) Active Problems:   Schizoaffective disorder, bipolar type (HCC)   Cocaine abuse (HCC)   Type 2 diabetes mellitus (HCC)   Prolonged QT interval   Acid reflux   History of DVT (deep vein thrombosis)   Status post bilateral BKA.   COPD (chronic obstructive pulmonary disease) (HCC)   Chronic systolic CHF (congestive heart failure) (HCC)   Benign essential HTN   LV (left ventricular) mural thrombus   Elevated liver enzymes   Dilated cardiomyopathy (HCC)   Impaired decision making    Plan   ## Psychiatric Medication Recommendations:  --Continue Latuda  40 mg every morning with breakfast for psychosis-with plan to up titrated as needed. --Continue Gabapentin  200 mg twice daily   ## Medical Decision Making Capacity: will not make an assessment  on capacity in the setting of having pending competency evaluation by court, scheduled for Sept.   ## Further Work-up:  -- Latuda  has very low propensity to prolong QT -- given long QT -> optimize K 4.0, Mg 2.0, and benefit-risk with all QT prolonging meds -- most recent EKG on 06/29/2024 had QtC of 511  -- Pertinent labwork reviewed earlier this admission   ## Disposition:-- There are no psychiatric contraindications to discharge at this time. Discharge is pending competency evaluation or other legally appropriate means of discharge. Please contact ethics team if discharge is planned prior to competency evaluation.   ## Behavioral / Environmental: - No specific recommendations at this time.     ## Safety and Observation Level:  - Based on my clinical evaluation, I estimate the patient to be at low risk of self harm in the current setting. - At this time, we recommend  routine. This decision is based on my review of the chart including patient's history and current presentation, interview of the patient, mental status examination, and consideration of suicide risk including evaluating suicidal ideation, plan, intent, suicidal or self-harm behaviors, risk factors, and protective factors. This judgment is based on our ability to directly  address suicide risk, implement suicide prevention strategies, and develop a safety plan while the patient is in the clinical setting. Please contact our team if there is a concern that risk level has changed.  CSSR Risk Category:C-SSRS RISK CATEGORY: No Risk  Suicide Risk Assessment: Patient has following modifiable risk factors for suicide: medication noncompliance, which we are addressing by competency evaluation to get pt guardian and access to resources. Patient has following non-modifiable or demographic risk factors for suicide: male gender, history of suicide attempt, and psychiatric hospitalization Patient has the following protective factors against  suicide: in controlled environment and not currently experiencing any acute psychiatric concerns.   Thank you for this consult request. Recommendations have been communicated to the primary team.  We will continue to follow up for now.     History of Present Illness  Relevant Aspects of [facility] Course:  Admitted on 03/04/2024 for strokelike symptoms.  Patient was diagnosed with left MCA territory stroke which involve numerous structures.  Patient had residual deficits including right-sided weakness and cognitive impairment.  Patient had been on treatment for the last several months and per primary team was making some improvements.  Patient had been deemed incompetent by hospital team and was asked by court to be evaluated for competency.  Guardian ad litem reported that felt patient had capacity which prompted current evaluation.  Per primary team patient appears to be at or near his medical baseline.  Patient Report:  When asking patient what he would do if he were to hypothetically leave the hospital today, he reports he does not know.  When asking him where he would stay, he report that he knows people but on further questioning reports that he does not know and says that he would talk to [facility].  Patient reports that he had given his key to [name], nurse with ACT team, and currently is unaware if he has access to his apartment still.  Patient reports that he is followed by [facility] and they frequently follow him to check on his mental health and other aspects of his health care.  Asked patient if he will continue to use cocaine after discharge and he adamantly reports no.  Asked patient what he would do if he had any medical concerns, and he reported he would call out for help.  Following this discussion, patient reports that he could stay at a halfway house and therapy people there for him to get help from if he has any medical concerns.  Talked to patient about ADLs, and he  reports that he is able to feed himself, use the bathroom by himself, and shower himself.  He then reports I cannot do everything myself.  When discussing who has helped in the past patient reports [name], [facility].  Asked patient if he had any family members or friends that he speaks with, and he reports he does not.  Asked patient if he could make the decision of leaving today and taking care of himself or waiting in the hospital until court evaluation and getting resources to help him function including group home, and he is unable to provide a meaningful answer for this.  Psych ROS:  Depression: Does not describe depressive symptoms.  Denies suicidal thoughts. Anxiety: Did not describe significant anxiety symptoms. Mania (lifetime and current): Does not describe recent manic symptoms. Psychosis: (lifetime and current): Endorses some symptoms of psychosis outside of cocaine use in the past but denies any symptoms psychosis since hospitalization  in April and being off of secondary medications and not using cocaine.  Collateral information:  [name], guardian ad litum, [phone]: Call and left HIPAA compliant voicemail.   Psychiatric and Social History  Psychiatric History:  Information collected from patient and chart  Prev Dx/Sx: Schizoaffective disorder Current Psych Provider: ACT team Home Meds (current): Abilify  5 mg twice a day and hydroxyzine  25 mg every 6 hours as needed Previous Med Trials: Multiple trials Therapy: Currently followed up ACT team   Prior Psych Hospitalization: Last hospitalization was at [facility] in October 2024 Prior Self Harm: History of suicidal behavior Prior Violence: History of agitation and carrying knife/scapel   Family Psych History: Unknown Family Hx suicide: Unknown  Social History:  Not discussed during this encounter.  Not much information per chart review.  Patient did appear to live at her apartment alone prior to current  admission.  Patient is followed by ACT team. Access to weapons/lethal means: Denies  Substance History Cocaine use disorder and hx of rehab. Also history of alc use but w/o complications. No other pertinent substance hx.   Exam Findings   Vital Signs:  Pulse Rate:  [97-101] 101 (08/04 1956) Resp:  [16-17] 17 (08/04 1956) BP: (121-191)/(82-133) 191/115 (08/04 2133) SpO2:  [98 %] 98 % (08/04 1956) Blood pressure (!) 191/115, pulse (!) 101, temperature 98 F (36.7 C), temperature source Oral, resp. rate 17, height 5' 9 (1.753 m), weight 61.7 kg, SpO2 98%. Body mass index is 20.09 kg/m.  Physical Exam Vitals and nursing note reviewed.  HENT:     Head: Normocephalic and atraumatic.  Pulmonary:     Effort: Pulmonary effort is normal.  Musculoskeletal:     Comments: Bilateral lower extremity amputations.  Neurological:     General: No focal deficit present.     Mental Status: He is alert.    Mental Status Exam  {Apperance:33225::Appropriate for environment,Sitting upright:1} {Behavior:33227::Calm:1} {Speech:33226::Normal Rate,Articulate,Normal Volume,Responsive:1} {Attitude:33228::Cooperative,Friendly:1} Mood: *** {Affect:33229::Euthymic,Normal Range,Mood Congruent:1} {Perception:33230::Not responding to internal stimuli:1} {Thought Content:33231::within normal limits:1} {Thought Form:33232::Goal Directed,Organized,Linear,Logical:1} {Cognition:33233::Alert & Oriented to person, place, and time,Recent and Remote memory grossly intact by recounting personal history,Immediate memory grossly intact by interview:1} {Judgment:33234::Fair:1} {Insight:33235::Fair:1}  {Reliability:33236:::1}  {Key Points:33238::Denies SI, HI, A&VH:1}   Other History   These have been pulled in through the EMR, reviewed, and updated if appropriate.  Family History:  The patient's family history includes Drug abuse in his mother; Suicidality in his  mother.  Medical History: Past Medical History:  Diagnosis Date   Asthma    Bipolar 1 disorder (HCC)    CHF (congestive heart failure) (HCC)    COPD (chronic obstructive pulmonary disease) (HCC)    Depression    Diabetes mellitus without complication (HCC)    GERD (gastroesophageal reflux disease)    Homelessness    Hypertension    QT prolongation    Schizoaffective disorder (HCC)     Surgical History: Past Surgical History:  Procedure Laterality Date   AMPUTATION Left 12/10/2022   Procedure: LEFT BELOW KNEE AMPUTATION;  Surgeon: Harden Jerona GAILS, MD;  Location: [facility] OR;  Service: Orthopedics;  Laterality: Left;   AMPUTATION TOE Bilateral 01/02/2021   Procedure: AMPUTATION TOE;  Surgeon: Gretel Ozell PARAS, DPM;  Location: [facility] OR;  Service: Podiatry;  Laterality: Bilateral;  Ok to leave patient on hospital bed   IR ANGIO INTRA EXTRACRAN SEL COM CAROTID INNOMINATE BILAT MOD SED  12/17/2020   IR ANGIO VERTEBRAL SEL VERTEBRAL BILAT MOD SED  12/17/2020   MULTIPLE EXTRACTIONS  WITH ALVEOLOPLASTY N/A 03/25/2023   Procedure: IRRIGATION AND DEBRIDEMENT, MULTIPLE EXTRACTION WITH ALVEOLOPLASTY X 4 , EXTRACTIONS 4 THROUGH 13, 20 THROUGHT 29;  Surgeon: Joanette Soulier, DMD;  Location: [facility] OR;  Service: Oral Surgery;  Laterality: N/A;   RADIOLOGY WITH ANESTHESIA N/A 12/17/2020   Procedure: IR WITH ANESTHESIA;  Surgeon: Radiologist, Medication, MD;  Location: [facility] OR;  Service: Radiology;  Laterality: N/A;     Medications:   Current Facility-Administered Medications:    albuterol  (PROVENTIL ) (2.5 MG/3ML) 0.083% nebulizer solution 2.5 mg, 2.5 mg, Nebulization, Q6H PRN, Danton Reyes DASEN, MD, 2.5 mg at 06/05/24 0840   carvedilol  (COREG  CR) 24 hr capsule 10 mg, 10 mg, Oral, Daily, Lenon Marien CROME, MD, 10 mg at 07/05/24 1120   feeding supplement (ENSURE ENLIVE / ENSURE PLUS) liquid 237 mL, 237 mL, Oral, BID BM PRN, Danton Reyes DASEN, MD   furosemide  (LASIX ) tablet 40 mg, 40 mg, Oral, Daily, [place], Hao, [place], 40  mg at 07/05/24 1120   gabapentin  (NEURONTIN ) capsule 200 mg, 200 mg, Oral, BID, Lord, Jamison Y, NP, 200 mg at 07/05/24 1120   hydrALAZINE  (APRESOLINE ) tablet 25 mg, 25 mg, Oral, Q6H PRN, Sundil, Subrina, MD   labetalol  (NORMODYNE ) injection 20 mg, 20 mg, Intravenous, Q3H PRN, Mansy, Jan A, MD, 20 mg at 05/24/24 2250   lactulose  (CHRONULAC ) 10 GM/15ML solution 20 g, 20 g, Oral, BID PRN, Akula, Vijaya, MD   lisinopril  (ZESTRIL ) tablet 40 mg, 40 mg, Oral, Daily, Feliz Ortiz, Abraham, MD, 40 mg at 07/05/24 1120   LORazepam  (ATIVAN ) tablet 1 mg, 1 mg, Oral, Q6H PRN, Sreeram, Narendranath, MD   lurasidone  (LATUDA ) tablet 40 mg, 40 mg, Oral, Q breakfast, Akintayo, Musa A, MD, 40 mg at 07/05/24 1120   meclizine  (ANTIVERT ) tablet 25 mg, 25 mg, Oral, TID PRN, Sreeram, Narendranath, MD, 25 mg at 07/02/24 2101   morphine  (PF) 2 MG/ML injection 1 mg, 1 mg, Intravenous, Q12H PRN, Arlon, Derek W, DO   Oral care mouth rinse, 15 mL, Mouth Rinse, PRN, Regalado, Belkys A, MD   oxyCODONE  (Oxy IR/ROXICODONE ) immediate release tablet 10 mg, 10 mg, Oral, Q4H PRN, Danton Reyes DASEN, MD, 10 mg at 07/04/24 1832   rivaroxaban  (XARELTO ) tablet 20 mg, 20 mg, Oral, QHS, Ogbata, Sylvester I, MD, 20 mg at 07/04/24 2130   rosuvastatin  (CRESTOR ) tablet 5 mg, 5 mg, Oral, QHS, Desai, Nikita S, MD, 5 mg at 07/04/24 2130   sodium chloride  0.9 % bolus 500 mL, 500 mL, Intravenous, Once, Calkins, Derek W, DO   spironolactone  (ALDACTONE ) tablet 25 mg, 25 mg, Oral, Daily, Pahwani, Ravi, MD, 25 mg at 07/05/24 1120  Allergies: Allergies  Allergen Reactions   Acetaminophen  Hives, Nausea And Vomiting, Rash and Anaphylaxis    PT states that if he takes this he is going to the hospital   Motrin  [Ibuprofen ] Anaphylaxis   Nsaids Anaphylaxis    PT states that if he takes this he is going to the hospital   Lyrica  Cr [Pregabalin  Er] Other (See Comments)    Dystonia Dysarthria    Peanut-Containing Drug Products Other (See Comments)    Pt  denies allergy to peanut butter 03/02/2021    D. Penne Mori PGY1 - Psychiatry

## 2024-07-05 NOTE — Consult Note (Addendum)
 [facility] Consult Follow-up  Patient Name: [name]  MRN: [id number]  DOB: [birth year]  Consult Order details:  Orders (From admission, onward)     Start     Ordered   06/28/24 1642  IP CONSULT TO PSYCHIATRY       Ordering Provider: Elpidio Reyes DEL, MD  Provider:  (Not yet assigned)  Question Answer Comment  Location [facility]   Reason for Consult? Question about improved capacity now that patient has been receiving treatment in house.  Patient's attorney requested repeat psych consult for possible discharge planning      06/28/24 1642   04/05/24 1343  IP CONSULT TO PSYCHIATRY       Ordering Provider: Judeth Trenda BIRCH, MD  Provider:  (Not yet assigned)  Question Answer Comment  Location [facility]   Reason for Consult? Acute stroke, embolic from large intracardiac clots, refusing Eliquis .  Schizoaffective d/o, lacks MDM capacity, has consistently refused to take Latuda .  ?  parenteral/IM, possibly long-acting [facility] medications.      04/05/24 1346   03/30/24 1323  IP CONSULT TO PSYCHIATRY       Comments: 54M, lived alone, B/L amputee, wheelchair, P/W acute left MCA stroke, large cardiac thrombus, through course of hospital admission with issues of agitation, refusal of care, refusing SNF, felt unsafe to DC home and documentation regarding lack of capacity (however did not specifically see this addressed in multiple psychiatry evaluations), currently medically stable awaiting disposition. APS contacted.  Kindly refer to [facility] MDs note from 4/30 regarding need for capacity evaluation.  Ordering Provider: Judeth Trenda BIRCH, MD  Provider:  (Not yet assigned)  Question Answer Comment  Location [facility]   Reason for Consult? Kindly assist with capacity evaluation> details in comments.      03/30/24 1323   03/14/24 1515  IP CONSULT TO PSYCHIATRY       Ordering Provider: Elpidio Reyes DEL, MD  Provider:  (Not yet  assigned)  Question Answer Comment  Location [facility]   Reason for Consult? Capacity evaluation -- patient verbally abusive, wants to go home, but oriented only to self.  Lives alone, seems to be unable to care for himself at home.  Essentially refusing all cares here      03/14/24 1514             Mode of Visit: In person    Psychiatry Consult Evaluation  Service Date: July 05, 2024 LOS:  LOS: 123 days  Chief Complaint I have been hearing voices telling me to kill myself.   Primary Psychiatric Diagnoses  Hx Schizoaffective disorder unspecified versus hx Substance induced psychosis/mood disorder versus hx MDD 2.  Stimulant use disorder (cocaine), in controlled environment  Assessment  [name] is a 58 y.o. male admitted: Medically on 03/04/2024  2:31 PM for residual stroke with prolonged hospitalization due to inability to care for self. 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   Seen numerous times by psychiatry at this point. Case summary and conceptualization:  Middle age male with significant substance use issues (possible schizoaffective disorder), apparent medication non-compliance, and severe medical problems (HF w EF < 20 and repeat CVA's with large current LA thrombus). Psychiatry consulted recently for capacity eval.  Patient demonstrated fair cognition during the eval but it was decided that since the patient has been appointed a guardian ad litem and has an upcoming competency hearing, he can not be determined to have capacity to leave the hospital (at least at that time on 7/31). Psychiatry signed off.   Psychiatry consulted again on 8/3 to address medication non-compliance. There was concern noted  for psychotic symptoms. Started on Latuda  40 mg daily by Dr. Sable covering the consult service.    07/04/24:  Rapid response called on pt yesterday for sudden slumping over with minimal responsiveness without noted shaking. BP was 63/51, fluids, CT head and cardiac monitoring ordered. Pt was fully alert within the next 2 hours, and refused CT. EKG was also refused this morning. Pt may not be eating all of his food, possibly interfering with the absorption of Latuda  which requires 350cal with intake for proper absorption. Pt presented this morning alert and oriented and did not show any overt signs of psychosis. Denies SI, HI, A&VH. Will not change medications at this time. Will follow up tomorrow and read nursing notes for further signs of paranoia.  8/5: Patient pleasant and appropriate today. He denies having had any perceptual disturbances since starting Latuda . He asks several questions about his medical care and guardianship, which I answered to the best of my ability. I deferred much of that discussion to the primary medical team. I did ask about his refusal of medication yesterday and he says there was a misunderstanding. He says he wants to take his medications each day. He appears to be doing well on Latuda . We will continue to follow the patient via chart review with in person visits approximately weekly.   Diagnoses:  Active Hospital problems: Principal Problem:   Acute CVA (cerebrovascular accident) [facility]) Active Problems:   Schizoaffective disorder, bipolar type (HCC)   Cocaine abuse (HCC)   Type 2 diabetes mellitus (HCC)   Prolonged QT interval   Acid reflux   History of DVT (deep vein thrombosis)   Status post bilateral BKA.   COPD (chronic obstructive pulmonary disease) (HCC)   Chronic systolic CHF (congestive heart failure) (HCC)   Benign essential HTN   LV (left ventricular) mural thrombus   Elevated liver enzymes   Dilated cardiomyopathy (HCC)   Impaired decision  making    Plan   ## Psychiatric Medication Recommendations:  --Continue Latuda  40 mg every morning with breakfast for psychosis-with plan to up titrated as needed. --Continue Gabapentin  200 mg twice daily   ## Medical Decision Making Capacity: will not make an assessment on capacity in the setting of having pending competency evaluation by court, scheduled for Sept.   ## Further Work-up:  -- Latuda  has very low propensity to prolong QT -- given long QT -> optimize K 4.0, Mg 2.0, and benefit-risk with all QT prolonging meds -- most recent EKG on 06/29/2024 had QtC of 511  -- Pertinent labwork reviewed earlier this admission   ## Disposition:-- There are no psychiatric contraindications to discharge at this time. Discharge is pending competency evaluation or other legally appropriate means of discharge. Please contact ethics team if discharge is planned prior to competency evaluation.   ## Behavioral / Environmental: - No specific recommendations at this time.     ## Safety and Observation Level:  - Based on my clinical evaluation, I estimate the patient to be at low risk of self harm in the current setting. - At this time, we recommend  routine. This decision  is based on my review of the chart including patient's history and current presentation, interview of the patient, mental status examination, and consideration of suicide risk including evaluating suicidal ideation, plan, intent, suicidal or self-harm behaviors, risk factors, and protective factors. This judgment is based on our ability to directly address suicide risk, implement suicide prevention strategies, and develop a safety plan while the patient is in the clinical setting. Please contact our team if there is a concern that risk level has changed.  CSSR Risk Category:C-SSRS RISK CATEGORY: No Risk  Suicide Risk Assessment: Patient has following modifiable risk factors for suicide: medication noncompliance, which we are  addressing by competency evaluation to get pt guardian and access to resources. Patient has following non-modifiable or demographic risk factors for suicide: male gender, history of suicide attempt, and psychiatric hospitalization Patient has the following protective factors against suicide: in controlled environment and not currently experiencing any acute psychiatric concerns.   Thank you for this consult request. Recommendations have been communicated to the primary team.  We will continue to follow up for now.     History of Present Illness  Relevant Aspects of [facility] Course:  Admitted on 03/04/2024 for strokelike symptoms.  Patient was diagnosed with left MCA territory stroke which involve numerous structures.  Patient had residual deficits including right-sided weakness and cognitive impairment. Echo showing cardiac clot.  Patient Report:   From capacity assessment on 7/31: When asking patient what he would do if he were to hypothetically leave the hospital today, he reports he does not know.  When asking him where he would stay, he report that he knows people but on further questioning reports that he does not know and says that he would talk to [facility].  Patient reports that he had given his key to [name], nurse with ACT team, and currently is unaware if he has access to his apartment still.  Patient reports that he is followed by [facility] and they frequently follow him to check on his mental health and other aspects of his health care.  Asked patient if he will continue to use cocaine after discharge and he adamantly reports no.  Asked patient what he would do if he had any medical concerns, and he reported he would call out for help.  Following this discussion, patient reports that he could stay at a halfway house and therapy people there for him to get help from if he has any medical concerns.  Talked to patient about ADLs, and he reports that he is able to feed  himself, use the bathroom by himself, and shower himself.  He then reports I cannot do everything myself.  When discussing who has helped in the past patient reports [name], [facility].  Asked patient if he had any family members or friends that he speaks with, and he reports he does not.  Asked patient if he could make the decision of leaving today and taking care of himself or waiting in the hospital until court evaluation and getting resources to help him function including group home, and he is unable to provide a meaningful answer for this.  Psych ROS:  Depression: Does not describe depressive symptoms.  Denies suicidal thoughts. Anxiety: Did not describe significant anxiety symptoms. Mania (lifetime and current): Does not describe recent manic symptoms. Psychosis: (lifetime and current): Endorses some symptoms of psychosis outside of cocaine use in the past but denies any symptoms psychosis since hospitalization in April and being off of  secondary medications and not using cocaine.  Collateral information:  [name], guardian ad litum, [phone]: Call and left HIPAA compliant voicemail.   Psychiatric and Social History  Psychiatric History:  Information collected from patient and chart  Prev Dx/Sx: Schizoaffective disorder Current Psych Provider: ACT team Home Meds (current): Abilify  5 mg twice a day and hydroxyzine  25 mg every 6 hours as needed Previous Med Trials: Multiple trials Therapy: Currently followed up ACT team   Prior Psych Hospitalization: Last hospitalization was at [facility] in October 2024 Prior Self Harm: History of suicidal behavior Prior Violence: History of agitation and carrying knife/scapel   Family Psych History: Unknown Family Hx suicide: Unknown  Social History:  Not discussed during this encounter.  Not much information per chart review.  Patient did appear to live at her apartment alone prior to current admission.  Patient is followed by ACT  team. Access to weapons/lethal means: Denies  Substance History Cocaine use disorder and hx of rehab. Also history of alc use but w/o complications. No other pertinent substance hx.   Exam Findings  Physical Exam:  Vital Signs:  Pulse Rate:  [83-101] 83 (08/05 1556) Resp:  [17-19] 19 (08/05 1556) BP: (122-191)/(91-133) 122/91 (08/05 1556) SpO2:  [96 %-98 %] 96 % (08/05 1556) Blood pressure (!) 122/91, pulse 83, temperature 98 F (36.7 C), temperature source Oral, resp. rate 19, height 5' 9 (1.753 m), weight 61.7 kg, SpO2 96%. Body mass index is 20.09 kg/m.  Physical Exam Vitals and nursing note reviewed.  HENT:     Head: Normocephalic and atraumatic.  Pulmonary:     Effort: Pulmonary effort is normal.  Musculoskeletal:     Comments: Bilateral lower extremity amputations.  Neurological:     General: No focal deficit present.     Mental Status: He is alert.     Mental Status Exam: General Appearance: Casual and Fairly Groomed.   Orientation:  Full (Time, Place, and Person)  Memory:  Recent;   Good Remote;   Fair  Concentration:  Concentration: Good  Recall:  Good  Attention  Fair  Eye Contact:  Good  Speech:  nml  Language:  Fair  Volume:  Normal  Mood: okay  Affect:  Constricted.   Thought Process:  logical  Thought Content: nml  Suicidal Thoughts:  No  Homicidal Thoughts:  No  Judgement:  Fair  Insight:  Fair  Psychomotor Activity:  Normal  Akathisia:  Negative  Fund of Knowledge:  Fair, lower end of normal. Unclear if chronic or since his CVA.       Assets:  Communication Skills Desire for Improvement Financial Resources/Insurance Leisure Time  Cognition: On brief assessment intact executive functioning.  ADL's:  Intact, subjective per patient; SNF per OT  AIMS (if indicated):        Other History   These have been pulled in through the EMR, reviewed, and updated if appropriate.  Family History:  The patient's family history includes Drug abuse  in his mother; Suicidality in his mother.  Medical History: Past Medical History:  Diagnosis Date   Asthma    Bipolar 1 disorder (HCC)    CHF (congestive heart failure) (HCC)    COPD (chronic obstructive pulmonary disease) (HCC)    Depression    Diabetes mellitus without complication (HCC)    GERD (gastroesophageal reflux disease)    Homelessness    Hypertension    QT prolongation    Schizoaffective disorder [facility])     Surgical History: Past Surgical  History:  Procedure Laterality Date   AMPUTATION Left 12/10/2022   Procedure: LEFT BELOW KNEE AMPUTATION;  Surgeon: Harden Jerona GAILS, MD;  Location[facility] OR;  Service: Orthopedics;  Laterality: Left;   AMPUTATION TOE Bilateral 01/02/2021   Procedure: AMPUTATION TOE;  Surgeon: Gretel Ozell PARAS, DPM;  Location: [facility] OR;  Service: Podiatry;  Laterality: Bilateral;  Ok to leave patient on hospital bed   IR ANGIO INTRA EXTRACRAN SEL COM CAROTID INNOMINATE BILAT MOD SED  12/17/2020   IR ANGIO VERTEBRAL SEL VERTEBRAL BILAT MOD SED  12/17/2020   MULTIPLE EXTRACTIONS WITH ALVEOLOPLASTY N/A 03/25/2023   Procedure: IRRIGATION AND DEBRIDEMENT, MULTIPLE EXTRACTION WITH ALVEOLOPLASTY X 4 , EXTRACTIONS 4 THROUGH 13, 20 THROUGHT 29;  Surgeon: Joanette Soulier, DMD;  Location: [facility] OR;  Service: Oral Surgery;  Laterality: N/A;   RADIOLOGY WITH ANESTHESIA N/A 12/17/2020   Procedure: IR WITH ANESTHESIA;  Surgeon: Radiologist, Medication, MD;  Location: [facility] OR;  Service: Radiology;  Laterality: N/A;     Medications:   Current Facility-Administered Medications:    albuterol  (PROVENTIL ) (2.5 MG/3ML) 0.083% nebulizer solution 2.5 mg, 2.5 mg, Nebulization, Q6H PRN, Danton Reyes DASEN, MD, 2.5 mg at 06/05/24 0840   carvedilol  (COREG  CR) 24 hr capsule 10 mg, 10 mg, Oral, Daily, Lenon Marien CROME, MD, 10 mg at 07/05/24 1120   feeding supplement (ENSURE ENLIVE / ENSURE PLUS) liquid 237 mL, 237 mL, Oral, BID BM PRN, Danton Reyes DASEN, MD   furosemide  (LASIX ) tablet 40 mg, 40  mg, Oral, Daily, Satartia, Hao, [place], 40 mg at 07/05/24 1120   gabapentin  (NEURONTIN ) capsule 200 mg, 200 mg, Oral, BID, Lord, Jamison Y, NP, 200 mg at 07/05/24 1120   hydrALAZINE  (APRESOLINE ) tablet 25 mg, 25 mg, Oral, Q6H PRN, Sundil, Subrina, MD   labetalol  (NORMODYNE ) injection 20 mg, 20 mg, Intravenous, Q3H PRN, Mansy, Jan A, MD, 20 mg at 05/24/24 2250   lactulose  (CHRONULAC ) 10 GM/15ML solution 20 g, 20 g, Oral, BID PRN, Akula, Vijaya, MD   lisinopril  (ZESTRIL ) tablet 40 mg, 40 mg, Oral, Daily, Odell Celinda Balo, MD, 40 mg at 07/05/24 1120   LORazepam  (ATIVAN ) tablet 1 mg, 1 mg, Oral, Q6H PRN, Sreeram, Narendranath, MD   lurasidone  (LATUDA ) tablet 40 mg, 40 mg, Oral, Q breakfast, Akintayo, Musa A, MD, 40 mg at 07/05/24 1120   meclizine  (ANTIVERT ) tablet 25 mg, 25 mg, Oral, TID PRN, Sreeram, Narendranath, MD, 25 mg at 07/02/24 2101   Oral care mouth rinse, 15 mL, Mouth Rinse, PRN, Regalado, Belkys A, MD   oxyCODONE  (Oxy IR/ROXICODONE ) immediate release tablet 10 mg, 10 mg, Oral, Q4H PRN, Danton Reyes DASEN, MD, 10 mg at 07/05/24 1334   rivaroxaban  (XARELTO ) tablet 20 mg, 20 mg, Oral, QHS, Ogbata, Sylvester I, MD, 20 mg at 07/04/24 2130   rosuvastatin  (CRESTOR ) tablet 5 mg, 5 mg, Oral, QHS, Desai, Nikita S, MD, 5 mg at 07/04/24 2130   sodium chloride  0.9 % bolus 500 mL, 500 mL, Intravenous, Once, Calkins, Derek W, DO   spironolactone  (ALDACTONE ) tablet 25 mg, 25 mg, Oral, Daily, Pahwani, Ravi, MD, 25 mg at 07/05/24 1120  Allergies: Allergies  Allergen Reactions   Acetaminophen  Hives, Nausea And Vomiting, Rash and Anaphylaxis    PT states that if he takes this he is going to the hospital   Motrin  [Ibuprofen ] Anaphylaxis   Nsaids Anaphylaxis    PT states that if he takes this he is going to the hospital   Lyrica  Cr [Pregabalin  Er] Other (See Comments)  Dystonia Dysarthria    Peanut-Containing Drug Products Other (See Comments)    Pt denies allergy to peanut butter 03/02/2021    Karleen Kaufmann, MD PGY-4

## 2024-07-06 DIAGNOSIS — I639 Cerebral infarction, unspecified: Secondary | ICD-10-CM | POA: Diagnosis not present

## 2024-07-06 NOTE — Progress Notes (Signed)
 Pt refused lab draw this morning.  MD notified.

## 2024-07-06 NOTE — Progress Notes (Signed)
 PROGRESS NOTE    [name]  FMW:[id number] DOB: 1966-10-05 DOA: 03/04/2024 PCP: Tanda Bleacher, MD   Brief Narrative:  [name] is a 58 year old male with medical history significant for COPD, cocaine abuse, presented to the ED 03/04/24 for slower speech, confusion and right-sided weakness, CT head showed left MCA infarct, CTA head and neck revealed an M2 occlusion on the left,   Echocardiogram revealed large mobile left ventricular thrombus on the left and right ventricular apical thrombus in the right, ejection fraction less than 20%.  Cardiology was consulted recommended DOAC.  PT evaluated patient and recommended skilled nursing facility placement.  Patient was found to lack medical decision capacity/status and not amenable to determining his discharge disposition or leave AGAINST MEDICAL ADVICE.   TOC is pursuing guardianship with APS as he is lacking medical decision capacity. He did complain of halluacinations, eating poor, refused medications, has insomnia.    Psychiatry 8/3 advised to start Latuda  for his hallucinations. Rapid response called due to altered mentation, BP lower side. Telemetry,  fluid bolus, CT head, labs ordered. 8/4 mental status back to baseline. He refused CT head. 8/5 continues to ambulate with wheel chair. Refuses vitals, meds, labs.   Assessment & Plan:   Principal Problem:   Acute CVA (cerebrovascular accident) (HCC) Active Problems:   LV (left ventricular) mural thrombus   Chronic systolic CHF (congestive heart failure) (HCC)   Impaired decision making   Prolonged QT interval   Elevated liver enzymes   COPD (chronic obstructive pulmonary disease) (HCC)   Cocaine abuse (HCC)   Schizoaffective disorder, bipolar type (HCC)   Benign essential HTN   Acid reflux   History of DVT (deep vein thrombosis)   Type 2 diabetes mellitus (HCC)   Status post bilateral BKA.   Dilated cardiomyopathy (HCC)  Acute embolic CVA Left MCA territory infarct,  caudate nucleus, lentiform nucleus and internal capsule MRI confirmed.  TEE was noted for large mobile left lower clot just under the mitral valve with an EF of 20% and a small right apical clot with reduced RV systolic function.  Continues on Xarelto .    Acute metabolic encephalopathy: 8/3 Rapid response called. IV fluid bolus 250ml x 2 ordered. BP improved, he refused CT head. Mental status back to baseline.    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  Patient on lisinopril , Coreg , Lasix  and Aldactone .   Schizoaffective disorder bipolar disorder: Lacks medical capacity. Psych recommended that the patient lacks medical capacity to make decisions if the patient attempts to leave AGAINST MEDICAL ADVICE he should be involuntarily committed.   Due to hallucinations, Psych followed 8/2 recommended Latuda  which was started on 07/03/2024 and await APS for guardianship, TOC in touch with APS regarding guardianship.  Court date for competency September first week.  Reassessed by psychiatry 07/05/2024 and per them, patient has improved.   Essential hypertension Blood pressure slightly elevated.  Continue Coreg , lisinopril , Aldactone  and Lasix .   Hyperlipidemia: Continue Crestor .   Diabetes mellitus type 2 A1c of 5.4. refuses blood sugar monitoring    Polysubstance abuse UDS was positive for cocaine and benzos. Discussed ill effects of illicit drugs.   Physical Deconditioning/right AKA and left BKA. Working on prosthesis   History of COPD Bronchodilators as needed   History of DVT On Xarelto  but frequently refuses   Acute headache Dizziness Headache better, dizziness improved with Meclizine . Need ENT eval as outpatient.   Disposition:  Pending court  date for competency (sept 8th?). Case management on board.  DVT prophylaxis: Xarelto    Code Status: Full Code  Family Communication:  None present at bedside.     Status is: Inpatient Remains inpatient appropriate because: Pending placement   Estimated body mass index is 20.09 kg/m as calculated from the following:   Height as of this encounter: 5' 9 (1.753 m).   Weight as of this encounter: 61.7 kg.    Nutritional Assessment: Body mass index is 20.09 kg/m.SABRA Seen by dietician.  I agree with the assessment and plan as outlined below: Nutrition Status:        . Skin Assessment: I have examined the patient's skin and I agree with the wound assessment as performed by the wound care RN as outlined below:    Consultants:  Psychiatry  Procedures:  None  Antimicrobials:  Anti-infectives (From admission, onward)    None         Subjective: Seen and examined.  No complaints.  Sitting in his wheelchair.  Objective: Vitals:   07/04/24 1549 07/04/24 1956 07/04/24 2133 07/05/24 1556  BP: 121/82 (!) 168/133 (!) 191/115 (!) 122/91  Pulse: 97 (!) 101  83  Resp: 16 17  19   Temp:      TempSrc:      SpO2: 98% 98%  96%  Weight:      Height:        Intake/Output Summary (Last 24 hours) at 07/06/2024 0811 Last data filed at 07/06/2024 0753 Gross per 24 hour  Intake 720 ml  Output 400 ml  Net 320 ml   Filed Weights   03/26/24 0431 03/28/24 0456 04/04/24 0500  Weight: 42.7 kg 47.5 kg 61.7 kg    Examination:  General exam: Appears calm and comfortable  Respiratory system: Clear to auscultation. Respiratory effort normal. Cardiovascular system: S1 & S2 heard, RRR. No JVD, murmurs, rubs, gallops or clicks. No pedal edema. Gastrointestinal system: Abdomen is nondistended, soft and nontender. No organomegaly or masses felt. Normal bowel sounds heard. Central nervous system: Alert and oriented. No focal neurological deficits. Extremities: Bilateral BKA.   Data Reviewed: I have personally reviewed following labs and imaging studies  CBC: Recent Labs  Lab 06/30/24 1037  WBC 6.1  HGB 15.3  HCT 45.4  MCV 91.2  PLT 203    Basic Metabolic Panel: Recent Labs  Lab 06/30/24 1037  NA 140  K 4.2  CL 107  CO2 26  GLUCOSE 94  BUN 18  CREATININE 1.33*  CALCIUM  9.2   GFR: Estimated Creatinine Clearance: 54.1 mL/min (A) (by C-G formula based on SCr of 1.33 mg/dL (H)). Liver Function Tests: No results for input(s): AST, ALT, ALKPHOS, BILITOT, PROT, ALBUMIN in the last 168 hours. No results for input(s): LIPASE, AMYLASE in the last 168 hours. No results for input(s): AMMONIA in the last 168 hours. Coagulation Profile: No results for input(s): INR, PROTIME in the last 168 hours. Cardiac Enzymes: No results for input(s): CKTOTAL, CKMB, CKMBINDEX, TROPONINI in the last 168 hours. BNP (last 3 results) No results for input(s): PROBNP in the last 8760 hours. HbA1C: No results for input(s): HGBA1C in the last 72 hours. CBG: Recent Labs  Lab 07/03/24 1334  GLUCAP 148*   Lipid Profile: No results for input(s): CHOL, HDL, LDLCALC, TRIG, CHOLHDL, LDLDIRECT in the last 72 hours. Thyroid  Function Tests: No results for input(s): TSH, T4TOTAL, FREET4, T3FREE, THYROIDAB in the last 72 hours. Anemia Panel: No results for input(s): VITAMINB12, FOLATE, FERRITIN, TIBC, IRON,  RETICCTPCT in the last 72 hours. Sepsis Labs: No results for input(s): PROCALCITON, LATICACIDVEN in the last 168 hours.  No results found for this or any previous visit (from the past 240 hours).   Radiology Studies: No results found.  Scheduled Meds:  carvedilol   10 mg Oral Daily   furosemide   40 mg Oral Daily   gabapentin   200 mg Oral BID   lisinopril   40 mg Oral Daily   lurasidone   40 mg Oral Q breakfast   rivaroxaban   20 mg Oral QHS   rosuvastatin   5 mg Oral QHS   spironolactone   25 mg Oral Daily   Continuous Infusions:  sodium chloride        LOS: 124 days   Fredia Skeeter, MD [facility]  07/06/2024, 8:11 AM   *Please note that this is a verbal  dictation therefore any spelling or grammatical errors are due to the Dragon [employer] One system interpretation.  Please page via Amion and do not message via secure chat for urgent patient care matters. Secure chat can be used for non urgent patient care matters.  How to contact the [facility] Attending or Consulting provider 7A - 7P or covering provider during after hours 7P -7A, for this patient?  Check the care team in [facility] and look for a) attending/consulting [facility] provider listed and b) the [facility] team listed. Page or secure chat 7A-7P. Log into [email or web address] and use [place]'s universal password to access. If you do not have the password, please contact the hospital operator. Locate the [facility] provider you are looking for under [facility] and page to a number that you can be directly reached. If you still have difficulty reaching the provider, please page the [facility] (Director on Call) for the Hospitalists listed on amion for assistance.

## 2024-07-06 NOTE — Plan of Care (Signed)
  Problem: Nutrition: Goal: Adequate nutrition will be maintained Outcome: Progressing   Problem: Coping: Goal: Level of anxiety will decrease Outcome: Progressing   Problem: Elimination: Goal: Will not experience complications related to bowel motility Outcome: Progressing Goal: Will not experience complications related to urinary retention Outcome: Progressing   Problem: Pain Managment: Goal: General experience of comfort will improve and/or be controlled Outcome: Progressing   Problem: Safety: Goal: Ability to remain free from injury will improve Outcome: Progressing   Problem: Skin Integrity: Goal: Risk for impaired skin integrity will decrease Outcome: Progressing

## 2024-07-06 NOTE — Progress Notes (Signed)
 Chaplain attempted visit with pt, but he was too sleepy. Will check in again tomorrow.

## 2024-07-07 DIAGNOSIS — I639 Cerebral infarction, unspecified: Secondary | ICD-10-CM | POA: Diagnosis not present

## 2024-07-07 NOTE — Progress Notes (Signed)
   07/07/24 1615  Spiritual Encounters  Type of Visit Follow up  Care provided to: Patient  Reason for visit Routine spiritual support  OnCall Visit No  Interventions  Spiritual Care Interventions Made Compassionate presence;Reflective listening  Intervention Outcomes  Outcomes  --

## 2024-07-07 NOTE — Progress Notes (Signed)
 Occupational Therapy Treatment Patient Details Name: [name] MRN: [id number] DOB: [birth year] Today's Date: 07/07/2024   History of present illness Patient is 58 y.o. male presented to ED on 03/04/24 with stroke like symptoms when caregiver found him with slurred speech and right sided weakness. Head CT reveals Acute left MCA territory infarct affecting the insula, caudate nucleus, lentiform nucleus and internal capsule. PMH significant of  Schizoaffective disorder, heart failure with reduced ejection fraction, pretension, DMII, COPD, s/p bilateral amputations, chronic pain, and polysubstance abuse.   OT comments  Patient continues to demonstrate good progress with tub bench transfers from CGA to supervision following setup, bathing and dressing. Discussed with nursing setup for shower transfer and shower. Further staff education would benefit in future session to increase carryover. Discharge recommendations continue to be appropriate. Acute OT to continue to follow to address established goals to facilitate DC to next venue of care.        If plan is discharge home, recommend the following:  A little help with walking and/or transfers;A little help with bathing/dressing/bathroom;Supervision due to cognitive status;Direct supervision/assist for financial management;Direct supervision/assist for medications management   Equipment Recommendations  None recommended by OT    Recommendations for Other Services      Precautions / Restrictions Precautions Precautions: Fall Recall of Precautions/Restrictions: Impaired Precaution/Restrictions Comments: h/o BLE amputations Required Braces or Orthoses: Other Brace Other Brace: L prosthesis Restrictions Weight Bearing Restrictions Per Provider Order: No       Mobility Bed Mobility               General bed mobility comments: OOB in wheelchair    Transfers Overall transfer level: Needs assistance Equipment used: None Transfers:  Bed to chair/wheelchair/BSC            Lateral/Scoot Transfers: Supervision General transfer comment: lateral scoot transfer for wheelchair to tub bench and back with setup provided     Balance Overall balance assessment: Mild deficits observed, not formally tested Sitting-balance support: Bilateral upper extremity supported Sitting balance-Leahy Scale: Good                                     ADL either performed or assessed with clinical judgement   ADL Overall ADL's : Needs assistance/impaired         Upper Body Bathing: Supervision/ safety;Sitting Upper Body Bathing Details (indicate cue type and reason): in shower Lower Body Bathing: Supervison/ safety;Sitting/lateral leans Lower Body Bathing Details (indicate cue type and reason): in shower Upper Body Dressing : Set up;Sitting Upper Body Dressing Details (indicate cue type and reason): donned gown             Tub/ Shower Transfer: Supervision/safety;Tub transfer;Tub bench Tub/Shower Transfer Details (indicate cue type and reason): supervision for safety   General ADL Comments: patient declined donning clothing and wanted to wear gown today    Extremity/Trunk Assessment              Vision       Perception     Praxis     Communication Communication Communication: Other (comment) (word finding difficulties) Factors Affecting Communication: Difficulty expressing self;Reduced clarity of speech   Cognition Arousal: Alert Behavior During Therapy: WFL for tasks assessed/performed, Flat affect Cognition: Cognition impaired Difficult to assess due to: Impaired communication   Awareness: Intellectual awareness intact, Online awareness impaired   Attention impairment (select first level of impairment): Selective attention  OT - Cognition Comments: Patient more flat and less talkative today                 Following commands: Intact Following commands impaired: Follows one step  commands inconsistently, Follows one step commands with increased time      Cueing   Cueing Techniques: Verbal cues, Visual cues  Exercises      Shoulder Instructions       General Comments      Pertinent Vitals/ Pain       Pain Assessment Pain Assessment: Faces Faces Pain Scale: No hurt Pain Intervention(s): Monitored during session  Home Living                                          Prior Functioning/Environment              Frequency  Min 1X/week        Progress Toward Goals  OT Goals(current goals can now be found in the care plan section)  Progress towards OT goals: Progressing toward goals  Acute Rehab OT Goals Patient Stated Goal: to get better OT Goal Formulation: With patient Time For Goal Achievement: 07/14/24 Potential to Achieve Goals: Fair ADL Goals Pt Will Perform Lower Body Bathing: with mod assist;sitting/lateral leans Pt Will Perform Lower Body Dressing: with modified independence;sitting/lateral leans;bed level Pt Will Transfer to Toilet: with modified independence;bedside commode (drop arm BSC, no cues for safety) Pt Will Perform Toileting - Clothing Manipulation and hygiene: with mod assist;sitting/lateral leans Pt Will Perform Tub/Shower Transfer: Tub transfer;tub bench;with modified independence Pt/caregiver will Perform Home Exercise Program: Right Upper extremity;Independently;With written HEP provided (to increase coordination with use of RUE) Additional ADL Goal #1: Pt will transfer to w/c with Mod I as a precursor for OOB ADLs Additional ADL Goal #2: Pt will utilize RUE to gather 5/5 objects without overshooting. Additional ADL Goal #3: Pt will demonstrate proper safety with transfers/mobility 100% of the time during session. Additional ADL Goal #4: Pt will demonstrate appropriate safety awareness 100% of the time when positioning w/c to transfer  Plan      Co-evaluation                 AM-PAC OT 6  Clicks Daily Activity     Outcome Measure   Help from another person eating meals?: None Help from another person taking care of personal grooming?: A Little Help from another person toileting, which includes using toliet, bedpan, or urinal?: A Little Help from another person bathing (including washing, rinsing, drying)?: A Little Help from another person to put on and taking off regular upper body clothing?: A Little Help from another person to put on and taking off regular lower body clothing?: A Little 6 Click Score: 19    End of Session Equipment Utilized During Treatment: Other (comment) (wheelchair and tub bench)  OT Visit Diagnosis: Unsteadiness on feet (R26.81);Other abnormalities of gait and mobility (R26.89);Muscle weakness (generalized) (M62.81);Other symptoms and signs involving the nervous system (R29.898);Other symptoms and signs involving cognitive function;Hemiplegia and hemiparesis Hemiplegia - Right/Left: Right Hemiplegia - dominant/non-dominant: Dominant Hemiplegia - caused by: Cerebral infarction   Activity Tolerance Patient tolerated treatment well   Patient Left in chair;with call bell/phone within reach   Nurse Communication Mobility status;Other (comment) (setup for shower)        Time: 8840-8775 OT Time Calculation (min): 25 min  Charges: OT General Charges $OT Visit: 1 Visit OT Treatments $Self Care/Home Management : 23-37 mins  [name], OTA Acute Rehabilitation Services  Office [phone]   [name] 07/07/2024, 1:41 PM

## 2024-07-07 NOTE — Plan of Care (Signed)
  Problem: Pain Managment: Goal: General experience of comfort will improve and/or be controlled Outcome: Progressing   Problem: Safety: Goal: Ability to remain free from injury will improve Outcome: Progressing   Problem: Skin Integrity: Goal: Risk for impaired skin integrity will decrease Outcome: Progressing

## 2024-07-07 NOTE — Progress Notes (Signed)
 PROGRESS NOTE    [name]  FMW:[id number] DOB: 1966/07/27 DOA: 03/04/2024 PCP: Tanda Bleacher, MD   Brief Narrative:  [name] is a 58 year old male with medical history significant for COPD, cocaine abuse, presented to the ED 03/04/24 for slower speech, confusion and right-sided weakness, CT head showed left MCA infarct, CTA head and neck revealed an M2 occlusion on the left,   Echocardiogram revealed large mobile left ventricular thrombus on the left and right ventricular apical thrombus in the right, ejection fraction less than 20%.  Cardiology was consulted recommended DOAC.  PT evaluated patient and recommended skilled nursing facility placement.  Patient was found to lack medical decision capacity/status and not amenable to determining his discharge disposition or leave AGAINST MEDICAL ADVICE.   TOC is pursuing guardianship with APS as he is lacking medical decision capacity. He did complain of halluacinations, eating poor, refused medications, has insomnia.    Psychiatry 8/3 advised to start Latuda  for his hallucinations. Rapid response called due to altered mentation, BP lower side. Telemetry,  fluid bolus, CT head, labs ordered. 8/4 mental status back to baseline. He refused CT head. 8/5 continues to ambulate with wheel chair. Refuses vitals, meds, labs.   Assessment & Plan:   Principal Problem:   Acute CVA (cerebrovascular accident) (HCC) Active Problems:   LV (left ventricular) mural thrombus   Chronic systolic CHF (congestive heart failure) (HCC)   Impaired decision making   Prolonged QT interval   Elevated liver enzymes   COPD (chronic obstructive pulmonary disease) (HCC)   Cocaine abuse (HCC)   Schizoaffective disorder, bipolar type (HCC)   Benign essential HTN   Acid reflux   History of DVT (deep vein thrombosis)   Type 2 diabetes mellitus (HCC)   Status post bilateral BKA.   Dilated cardiomyopathy (HCC)  Acute embolic CVA Left MCA territory infarct,  caudate nucleus, lentiform nucleus and internal capsule MRI confirmed.  TEE was noted for large mobile left lower clot just under the mitral valve with an EF of 20% and a small right apical clot with reduced RV systolic function.  Continues on Xarelto .    Acute metabolic encephalopathy: 8/3 Rapid response called. IV fluid bolus 250ml x 2 ordered. BP improved, he refused CT head. Mental status back to baseline.    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  Patient on lisinopril , Coreg , Lasix  and Aldactone .   Schizoaffective disorder bipolar disorder: Lacks medical capacity. Psych recommended that the patient lacks medical capacity to make decisions if the patient attempts to leave AGAINST MEDICAL ADVICE he should be involuntarily committed.   Due to hallucinations, Psych followed 8/2 recommended Latuda  which was started on 07/03/2024 and await APS for guardianship, TOC in touch with APS regarding guardianship.  Court date for competency September first week.  Reassessed by psychiatry 07/05/2024 and per them, patient has improved.   Essential hypertension Blood pressure slightly elevated.  Continue Coreg , lisinopril , Aldactone  and Lasix .   Hyperlipidemia: Continue Crestor .   Diabetes mellitus type 2 A1c of 5.4. refuses blood sugar monitoring    Polysubstance abuse UDS was positive for cocaine and benzos. Discussed ill effects of illicit drugs.   Physical Deconditioning/right AKA and left BKA. Working on prosthesis   History of COPD Bronchodilators as needed   History of DVT On Xarelto  but frequently refuses   Acute headache Dizziness Headache better, dizziness improved with Meclizine . Need ENT eval as outpatient.   Disposition:  Pending court  date for competency (sept 8th?). Case management on board.  DVT prophylaxis: Xarelto    Code Status: Full Code  Family Communication:  None present at bedside.     Status is: Inpatient Remains inpatient appropriate because: Pending placement   Estimated body mass index is 20.09 kg/m as calculated from the following:   Height as of this encounter: 5' 9 (1.753 m).   Weight as of this encounter: 61.7 kg.    Nutritional Assessment: Body mass index is 20.09 kg/m.[name] Seen by dietician.  I agree with the assessment and plan as outlined below: Nutrition Status:        . Skin Assessment: I have examined the patient's skin and I agree with the wound assessment as performed by the wound care RN as outlined below:    Consultants:  Psychiatry  Procedures:  None  Antimicrobials:  Anti-infectives (From admission, onward)    None         Subjective: Seen and examined.  No complaints.  Continues to refuse labs or medications at times.  Objective: Vitals:   07/05/24 1556 07/06/24 1136 07/06/24 2005 07/07/24 0403  BP:  128/86 (!) 180/86 114/68  Pulse: 83 77  84  Resp: 19 18 18 20   Temp:  98.2 F (36.8 C)  98.5 F (36.9 C)  TempSrc:  Oral    SpO2: 96% 100%  98%  Weight:      Height:        Intake/Output Summary (Last 24 hours) at 07/07/2024 1027 Last data filed at 07/06/2024 1135 Gross per 24 hour  Intake 480 ml  Output --  Net 480 ml   Filed Weights   03/26/24 0431 03/28/24 0456 04/04/24 0500  Weight: 42.7 kg 47.5 kg 61.7 kg    Examination:  General exam: Appears calm and comfortable  Respiratory system: Clear to auscultation. Respiratory effort normal. Cardiovascular system: S1 & S2 heard, RRR. No JVD, murmurs, rubs, gallops or clicks. No pedal edema. Gastrointestinal system: Abdomen is nondistended, soft and nontender. No organomegaly or masses felt. Normal bowel sounds heard. Central nervous system: Alert and oriented. No focal neurological deficits. Extremities: Bilateral BKA.   Data Reviewed: I have personally reviewed following labs and imaging studies  CBC: Recent Labs  Lab 06/30/24 1037  WBC 6.1  HGB  15.3  HCT 45.4  MCV 91.2  PLT 203   Basic Metabolic Panel: Recent Labs  Lab 06/30/24 1037  NA 140  K 4.2  CL 107  CO2 26  GLUCOSE 94  BUN 18  CREATININE 1.33*  CALCIUM  9.2   GFR: Estimated Creatinine Clearance: 54.1 mL/min (A) (by C-G formula based on SCr of 1.33 mg/dL (H)). Liver Function Tests: No results for input(s): AST, ALT, ALKPHOS, BILITOT, PROT, ALBUMIN in the last 168 hours. No results for input(s): LIPASE, AMYLASE in the last 168 hours. No results for input(s): AMMONIA in the last 168 hours. Coagulation Profile: No results for input(s): INR, PROTIME in the last 168 hours. Cardiac Enzymes: No results for input(s): CKTOTAL, CKMB, CKMBINDEX, TROPONINI in the last 168 hours. BNP (last 3 results) No results for input(s): PROBNP in the last 8760 hours. HbA1C: No results for input(s): HGBA1C in the last 72 hours. CBG: Recent Labs  Lab 07/03/24 1334  GLUCAP 148*   Lipid Profile: No results for input(s): CHOL, HDL, LDLCALC, TRIG, CHOLHDL, LDLDIRECT in the last 72 hours. Thyroid  Function Tests: No results for input(s): TSH, T4TOTAL, FREET4, T3FREE, THYROIDAB in the last 72 hours. Anemia Panel: No results for  input(s): VITAMINB12, FOLATE, FERRITIN, TIBC, IRON, RETICCTPCT in the last 72 hours. Sepsis Labs: No results for input(s): PROCALCITON, LATICACIDVEN in the last 168 hours.  No results found for this or any previous visit (from the past 240 hours).   Radiology Studies: No results found.  Scheduled Meds:  carvedilol   10 mg Oral Daily   furosemide   40 mg Oral Daily   gabapentin   200 mg Oral BID   lisinopril   40 mg Oral Daily   lurasidone   40 mg Oral Q breakfast   rivaroxaban   20 mg Oral QHS   rosuvastatin   5 mg Oral QHS   spironolactone   25 mg Oral Daily   Continuous Infusions:  sodium chloride        LOS: 125 days   Fredia Skeeter, MD [facility]  07/07/2024, 10:27 AM    *Please note that this is a verbal dictation therefore any spelling or grammatical errors are due to the Dragon Medical One system interpretation.  Please page via Amion and do not message via secure chat for urgent patient care matters. Secure chat can be used for non urgent patient care matters.  How to contact the [facility] Attending or Consulting provider 7A - 7P or covering provider during after hours 7P -7A, for this patient?  Check the care team in [facility] and look for a) attending/consulting TR[facility] provider listed and b) the [facility] team listed. Page or secure chat 7A-7P. Log into [email or web address] and use [place] universal password to access. If you do not have the password, please contact the hospital operator. Locate the [facility] provider you are looking for under [facility] and page to a number that you can be directly reached. If you still have difficulty reaching the provider, please page the [facility] (Director on Call) for the Hospitalists listed on amion for assistance.

## 2024-07-08 DIAGNOSIS — I639 Cerebral infarction, unspecified: Secondary | ICD-10-CM | POA: Diagnosis not present

## 2024-07-08 NOTE — Progress Notes (Signed)
 PROGRESS NOTE    [name]  FMW:[id number] DOB: [birth year] DOA: 03/04/2024 PCP: Tanda Bleacher, MD   Brief Narrative:  [name] is a 58 year old male with medical history significant for COPD, cocaine abuse, presented to the ED 03/04/24 for slower speech, confusion and right-sided weakness, CT head showed left MCA infarct, CTA head and neck revealed an M2 occlusion on the left,   Echocardiogram revealed large mobile left ventricular thrombus on the left and right ventricular apical thrombus in the right, ejection fraction less than 20%.  Cardiology was consulted recommended DOAC.  PT evaluated patient and recommended skilled nursing facility placement.  Patient was found to lack medical decision capacity/status and not amenable to determining his discharge disposition or leave AGAINST MEDICAL ADVICE.   TOC is pursuing guardianship with APS as he is lacking medical decision capacity. He did complain of halluacinations, eating poor, refused medications, has insomnia.    Psychiatry 8/3 advised to start Latuda  for his hallucinations. Rapid response called due to altered mentation, BP lower side. Telemetry,  fluid bolus, CT head, labs ordered. 8/4 mental status back to baseline. He refused CT head. 8/5 continues to ambulate with wheel chair. Refuses vitals, meds, labs.   Assessment & Plan:   Principal Problem:   Acute CVA (cerebrovascular accident) (HCC) Active Problems:   LV (left ventricular) mural thrombus   Chronic systolic CHF (congestive heart failure) (HCC)   Impaired decision making   Prolonged QT interval   Elevated liver enzymes   COPD (chronic obstructive pulmonary disease) (HCC)   Cocaine abuse (HCC)   Schizoaffective disorder, bipolar type (HCC)   Benign essential HTN   Acid reflux   History of DVT (deep vein thrombosis)   Type 2 diabetes mellitus (HCC)   Status post bilateral BKA.   Dilated cardiomyopathy (HCC)  Acute embolic CVA Left MCA territory infarct,  caudate nucleus, lentiform nucleus and internal capsule MRI confirmed.  TEE was noted for large mobile left lower clot just under the mitral valve with an EF of 20% and a small right apical clot with reduced RV systolic function.  Continues on Xarelto .    Acute metabolic encephalopathy: 8/3 Rapid response called. IV fluid bolus 250ml x 2 ordered. BP improved, he refused CT head. Mental status back to baseline.    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  Patient on lisinopril , Coreg , Lasix  and Aldactone .   Schizoaffective disorder bipolar disorder: Lacks medical capacity. Psych recommended that the patient lacks medical capacity to make decisions if the patient attempts to leave AGAINST MEDICAL ADVICE he should be involuntarily committed.   Due to hallucinations, Psych followed 8/2 recommended Latuda  which was started on 07/03/2024 and await APS for guardianship, TOC in touch with APS regarding guardianship.  Court date for competency September first week.  Reassessed by psychiatry 07/05/2024 and per them, patient has improved.   Essential hypertension Blood pressure slightly elevated.  Continue Coreg , lisinopril , Aldactone  and Lasix .   Hyperlipidemia: Continue Crestor .   Diabetes mellitus type 2 A1c of 5.4. refuses blood sugar monitoring    Polysubstance abuse UDS was positive for cocaine and benzos. Discussed ill effects of illicit drugs.   Physical Deconditioning/right AKA and left BKA. Working on prosthesis   History of COPD Bronchodilators as needed   History of DVT On Xarelto  but frequently refuses   Acute headache Dizziness Headache better, dizziness improved with Meclizine . Need ENT eval as outpatient.   Disposition:  Pending court  date for competency (sept 8th?). Case management on board.  DVT prophylaxis: Xarelto    Code Status: Full Code  Family Communication:  None present at bedside.     Status is: Inpatient Remains inpatient appropriate because: Pending placement   Estimated body mass index is 20.09 kg/m as calculated from the following:   Height as of this encounter: 5' 9 (1.753 m).   Weight as of this encounter: 61.7 kg.    Nutritional Assessment: Body mass index is 20.09 kg/m.[name] Seen by dietician.  I agree with the assessment and plan as outlined below: Nutrition Status:        . Skin Assessment: I have examined the patient's skin and I agree with the wound assessment as performed by the wound care RN as outlined below:    Consultants:  Psychiatry  Procedures:  None  Antimicrobials:  Anti-infectives (From admission, onward)    None         Subjective: Seen and examined.  No complaints.  Continues to refuse medications intermittently.  Objective: Vitals:   07/07/24 1546 07/07/24 2022 07/07/24 2030 07/08/24 1119  BP: (!) 152/114 (!) 82/49 (!) 92/57 (!) 152/87  Pulse: 82 91 91 71  Resp: 19 14 18 17   TempSrc:      SpO2: 99% 97% 100% 99%  Weight:      Height:        Intake/Output Summary (Last 24 hours) at 07/08/2024 1142 Last data filed at 07/07/2024 1200 Gross per 24 hour  Intake 240 ml  Output --  Net 240 ml   Filed Weights   03/26/24 0431 03/28/24 0456 04/04/24 0500  Weight: 42.7 kg 47.5 kg 61.7 kg    Examination:  General exam: Appears calm and comfortable  Respiratory system: Clear to auscultation. Respiratory effort normal. Cardiovascular system: S1 & S2 heard, RRR. No JVD, murmurs, rubs, gallops or clicks. No pedal edema. Gastrointestinal system: Abdomen is nondistended, soft and nontender. No organomegaly or masses felt. Normal bowel sounds heard. Central nervous system: Alert and oriented. No focal neurological deficits. Extremities: Bilateral BKA.   Data Reviewed: I have personally reviewed following labs and imaging studies  CBC: No results for input(s): WBC, NEUTROABS, HGB, HCT, MCV, PLT in the  last 168 hours.  Basic Metabolic Panel: No results for input(s): NA, K, CL, CO2, GLUCOSE, BUN, CREATININE, CALCIUM , MG, PHOS in the last 168 hours.  GFR: Estimated Creatinine Clearance: 54.1 mL/min (A) (by C-G formula based on SCr of 1.33 mg/dL (H)). Liver Function Tests: No results for input(s): AST, ALT, ALKPHOS, BILITOT, PROT, ALBUMIN in the last 168 hours. No results for input(s): LIPASE, AMYLASE in the last 168 hours. No results for input(s): AMMONIA in the last 168 hours. Coagulation Profile: No results for input(s): INR, PROTIME in the last 168 hours. Cardiac Enzymes: No results for input(s): CKTOTAL, CKMB, CKMBINDEX, TROPONINI in the last 168 hours. BNP (last 3 results) No results for input(s): PROBNP in the last 8760 hours. HbA1C: No results for input(s): HGBA1C in the last 72 hours. CBG: Recent Labs  Lab 07/03/24 1334  GLUCAP 148*   Lipid Profile: No results for input(s): CHOL, HDL, LDLCALC, TRIG, CHOLHDL, LDLDIRECT in the last 72 hours. Thyroid  Function Tests: No results for input(s): TSH, T4TOTAL, FREET4, T3FREE, THYROIDAB in the last 72 hours. Anemia Panel: No results for input(s): VITAMINB12, FOLATE, FERRITIN, TIBC, IRON, RETICCTPCT in the last 72 hours. Sepsis Labs: No results for input(s): PROCALCITON, LATICACIDVEN in the last 168 hours.  No results found for this  or any previous visit (from the past 240 hours).   Radiology Studies: No results found.  Scheduled Meds:  carvedilol   10 mg Oral Daily   furosemide   40 mg Oral Daily   gabapentin   200 mg Oral BID   lisinopril   40 mg Oral Daily   lurasidone   40 mg Oral Q breakfast   rivaroxaban   20 mg Oral QHS   rosuvastatin   5 mg Oral QHS   spironolactone   25 mg Oral Daily   Continuous Infusions:  sodium chloride        LOS: 126 days   Fredia Skeeter, MD [facility]  07/08/2024, 11:42 AM   *Please note  that this is a verbal dictation therefore any spelling or grammatical errors are due to the Dragon Medical One system interpretation.  Please page via Amion and do not message via secure chat for urgent patient care matters. Secure chat [name] be used for non urgent patient care matters.  How to contact the [facility] Attending or Consulting provider 7A - 7P or covering provider during after hours 7P -7A, for this patient?  Check the care team in [facility] and look for a) attending/consulting TR[facility] provider listed and b) the [facility] team listed. Page or secure chat 7A-7P. Log into [email or web address] and use [place] universal password to access. If you do not have the password, please contact the hospital operator. Locate the [facility] provider you are looking for under [facility] and page to a number that you [name] be directly reached. If you still have difficulty reaching the provider, please page the [facility] (Director on Call) for the Hospitalists listed on amion for assistance.

## 2024-07-08 NOTE — Progress Notes (Signed)
 Speech Language Pathology Treatment: Cognitive-Linguistic  Patient Details Name: [name] MRN: [id number] DOB: 12/10/1965 Today's Date: 07/08/2024 Time: 8484-8450 SLP Time Calculation (min) (ACUTE ONLY): 34 min  Assessment / Plan / Recommendation Clinical Impression  Pt was upright in his wheelchair for session. He took a phone call at the beginning of the session and was able to express himself with Mod I given extra time. Min cues were given for word-finding in conversation with SLP. He was more agreeable to working on medication management today, so started with label reading activity. He skipped one question, but did also repeat a question, needing Min cues for awareness and self-management. He had a little more trouble with comprehending information written on label in order to answer questions accurately. Mod cues were given by SLP. At the end of the session, pt initiated conversation about schedule of his therapies, using his calendar with Mod I but needing help with recall and planning. He thought that his prosthetist was supposed to come today, although per notes in chart this is scheduled for Mondays. Pt did state that he likes speech therapy and wants to continue to work with SLP, and showed increased flexibility in terms of scheduling. Will continue to follow.   HPI HPI: Patient is 58 y.o. male presented to ED on 03/04/24 with stroke like symptoms when caregiver found him with slurred speech and right sided weakness. Head CT reveals Acute left MCA territory infarct affecting the insula, caudate nucleus, lentiform nucleus and internal capsule. Pt initially evaluated by SLP 4/5 but s/o 4/22 due to repeated refusals. SLP reordered 5/13 for repeat cognitive-linguistic eval.   PMH significant of  Schizoaffective disorder, heart failure with reduced ejection fraction, pretension, DMII, COPD, s/p bilateral amputations, chronic pain, and polysubstance abuse.      SLP Plan  Continue with current  plan of care          Recommendations                         Intermittent Supervision/Assistance (assist wtih meds/finances) Cognitive communication deficit (M58.158)     Continue with current plan of care     Leita SAILOR., M.A. CCC-SLP Acute Rehabilitation Services Office: [phone]  Secure chat preferred   07/08/2024, 3:53 PM

## 2024-07-08 NOTE — Plan of Care (Signed)
   Problem: Nutrition: Goal: Adequate nutrition will be maintained Outcome: Progressing

## 2024-07-08 NOTE — Plan of Care (Signed)
  Problem: Nutrition: Goal: Adequate nutrition will be maintained Outcome: Progressing   Problem: Pain Managment: Goal: General experience of comfort will improve and/or be controlled Outcome: Progressing   Problem: Safety: Goal: Ability to remain free from injury will improve Outcome: Progressing   Problem: Skin Integrity: Goal: Risk for impaired skin integrity will decrease Outcome: Progressing

## 2024-07-08 NOTE — Progress Notes (Signed)
 Pt refused all medications at this time stating, I'm not taking any medicine today. Fredia Skeeter, MD notified via page.

## 2024-07-09 DIAGNOSIS — I639 Cerebral infarction, unspecified: Secondary | ICD-10-CM | POA: Diagnosis not present

## 2024-07-09 NOTE — Progress Notes (Addendum)
 Rounded on patient; sleeping, respirations even and unlabored; no acute distress.

## 2024-07-09 NOTE — Progress Notes (Signed)
 PROGRESS NOTE    [name]  FMW:[id number] DOB: Mar 14, 1966 DOA: 03/04/2024 PCP: Tanda Bleacher, MD   Brief Narrative:  [name] is a 58 year old male with medical history significant for COPD, cocaine abuse, presented to the ED 03/04/24 for slower speech, confusion and right-sided weakness, CT head showed left MCA infarct, CTA head and neck revealed an M2 occlusion on the left,   Echocardiogram revealed large mobile left ventricular thrombus on the left and right ventricular apical thrombus in the right, ejection fraction less than 20%.  Cardiology was consulted recommended DOAC.  PT evaluated patient and recommended skilled nursing facility placement.  Patient was found to lack medical decision capacity/status and not amenable to determining his discharge disposition or leave AGAINST MEDICAL ADVICE.   TOC is pursuing guardianship with APS as he is lacking medical decision capacity. He did complain of halluacinations, eating poor, refused medications, has insomnia.    Psychiatry 8/3 advised to start Latuda  for his hallucinations. Rapid response called due to altered mentation, BP lower side. Telemetry,  fluid bolus, CT head, labs ordered. 8/4 mental status back to baseline. He refused CT head. 8/5 continues to ambulate with wheel chair. Refuses vitals, meds, labs.   Assessment & Plan:   Principal Problem:   Acute CVA (cerebrovascular accident) (HCC) Active Problems:   LV (left ventricular) mural thrombus   Chronic systolic CHF (congestive heart failure) (HCC)   Impaired decision making   Prolonged QT interval   Elevated liver enzymes   COPD (chronic obstructive pulmonary disease) (HCC)   Cocaine abuse (HCC)   Schizoaffective disorder, bipolar type (HCC)   Benign essential HTN   Acid reflux   History of DVT (deep vein thrombosis)   Type 2 diabetes mellitus (HCC)   Status post bilateral BKA.   Dilated cardiomyopathy (HCC)  Acute embolic CVA Left MCA territory infarct,  caudate nucleus, lentiform nucleus and internal capsule MRI confirmed.  TEE was noted for large mobile left lower clot just under the mitral valve with an EF of 20% and a small right apical clot with reduced RV systolic function.  Continues on Xarelto .    Acute metabolic encephalopathy: 8/3 Rapid response called. IV fluid bolus 250ml x 2 ordered. BP improved, he refused CT head. Mental status back to baseline.    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  Patient on lisinopril , Coreg , Lasix  and Aldactone .   Schizoaffective disorder bipolar disorder: Lacks medical capacity. Psych recommended that the patient lacks medical capacity to make decisions if the patient attempts to leave AGAINST MEDICAL ADVICE he should be involuntarily committed.   Due to hallucinations, Psych followed 8/2 recommended Latuda  which was started on 07/03/2024 and await APS for guardianship, TOC in touch with APS regarding guardianship.  Court date for competency September first week.  Reassessed by psychiatry 07/05/2024 and per them, patient has improved.   Essential hypertension Blood pressure slightly elevated.  Continue Coreg , lisinopril , Aldactone  and Lasix .   Hyperlipidemia: Continue Crestor .   Diabetes mellitus type 2 A1c of 5.4. refuses blood sugar monitoring    Polysubstance abuse UDS was positive for cocaine and benzos. Discussed ill effects of illicit drugs.   Physical Deconditioning/right AKA and left BKA. Working on prosthesis   History of COPD Bronchodilators as needed   History of DVT On Xarelto  but frequently refuses   Acute headache Dizziness Headache better, dizziness improved with Meclizine . Need ENT eval as outpatient.   Disposition:  Pending court  date for competency (sept 8th?). Case management on board.  DVT prophylaxis: Xarelto    Code Status: Full Code  Family Communication:  None present at bedside.     Status is: Inpatient Remains inpatient appropriate because: Pending placement   Estimated body mass index is 20.09 kg/m as calculated from the following:   Height as of this encounter: 5' 9 (1.753 m).   Weight as of this encounter: 61.7 kg.    Nutritional Assessment: Body mass index is 20.09 kg/m.SABRA Seen by dietician.  I agree with the assessment and plan as outlined below: Nutrition Status:        . Skin Assessment: I have examined the patient's skin and I agree with the wound assessment as performed by the wound care RN as outlined below:    Consultants:  Psychiatry  Procedures:  None  Antimicrobials:  Anti-infectives (From admission, onward)    None         Subjective: Seen and examined.  Denied any complaints.  Patient was on the wheelchair and moving around in the room.  Objective: Vitals:   07/08/24 1119 07/09/24 0029 07/09/24 0405 07/09/24 1051  BP: (!) 152/87 (!) 140/86 (!) 131/97 136/78  Pulse: 71 78 70 84  Resp: 17   18  TempSrc:      SpO2: 99% 98% 96% 100%  Weight:      Height:        Intake/Output Summary (Last 24 hours) at 07/09/2024 1316 Last data filed at 07/08/2024 1800 Gross per 24 hour  Intake 360 ml  Output --  Net 360 ml   Filed Weights   03/26/24 0431 03/28/24 0456 04/04/24 0500  Weight: 42.7 kg 47.5 kg 61.7 kg    Examination:  General exam: Appears calm and comfortable  Respiratory system: Clear to auscultation. Respiratory effort normal. Cardiovascular system: S1 & S2 heard, RRR. No JVD, murmurs, rubs, gallops or clicks. No pedal edema. Gastrointestinal system: Abdomen is nondistended, soft and nontender. No organomegaly or masses felt. Normal bowel sounds heard. Central nervous system: Alert and oriented. No focal neurological deficits. Extremities: Bilateral BKA.   Data Reviewed: I have personally reviewed following labs and imaging studies  CBC: No results for input(s): WBC, NEUTROABS, HGB, HCT, MCV,  PLT in the last 168 hours.  Basic Metabolic Panel: No results for input(s): NA, K, CL, CO2, GLUCOSE, BUN, CREATININE, CALCIUM , MG, PHOS in the last 168 hours.  GFR: Estimated Creatinine Clearance: 54.1 mL/min (A) (by C-G formula based on SCr of 1.33 mg/dL (H)). Liver Function Tests: No results for input(s): AST, ALT, ALKPHOS, BILITOT, PROT, ALBUMIN in the last 168 hours. No results for input(s): LIPASE, AMYLASE in the last 168 hours. No results for input(s): AMMONIA in the last 168 hours. Coagulation Profile: No results for input(s): INR, PROTIME in the last 168 hours. Cardiac Enzymes: No results for input(s): CKTOTAL, CKMB, CKMBINDEX, TROPONINI in the last 168 hours. BNP (last 3 results) No results for input(s): PROBNP in the last 8760 hours. HbA1C: No results for input(s): HGBA1C in the last 72 hours. CBG: Recent Labs  Lab 07/03/24 1334  GLUCAP 148*   Lipid Profile: No results for input(s): CHOL, HDL, LDLCALC, TRIG, CHOLHDL, LDLDIRECT in the last 72 hours. Thyroid  Function Tests: No results for input(s): TSH, T4TOTAL, FREET4, T3FREE, THYROIDAB in the last 72 hours. Anemia Panel: No results for input(s): VITAMINB12, FOLATE, FERRITIN, TIBC, IRON, RETICCTPCT in the last 72 hours. Sepsis Labs: No results for input(s): PROCALCITON, LATICACIDVEN in the last 168 hours.  No results found for this or any previous visit (from the past 240 hours).   Radiology Studies: No results found.  Scheduled Meds:  carvedilol   10 mg Oral Daily   furosemide   40 mg Oral Daily   gabapentin   200 mg Oral BID   lisinopril   40 mg Oral Daily   lurasidone   40 mg Oral Q breakfast   rivaroxaban   20 mg Oral QHS   rosuvastatin   5 mg Oral QHS   spironolactone   25 mg Oral Daily   Continuous Infusions:  sodium chloride        LOS: 127 days   Elvan Sor, MD [facility]  07/09/2024, 1:16 PM    *Please note that this is a verbal dictation therefore any spelling or grammatical errors are due to the Dragon Medical One system interpretation.  Please page via Amion and do not message via secure chat for urgent patient care matters. Secure chat can be used for non urgent patient care matters.  How to contact the [facility] Attending or Consulting provider 7A - 7P or covering provider during after hours 7P -7A, for this patient?  Check the care team in [facility] and look for a) attending/consulting TRH provider listed and b) the TR[facility] team listed. Page or secure chat 7A-7P. Log into [email or web address] and use [place]'s universal password to access. If you do not have the password, please contact the hospital operator. Locate the [facility] provider you are looking for under [facility] and page to a number that you can be directly reached. If you still have difficulty reaching the provider, please page the [facility] (Director on Call) for the Hospitalists listed on amion for assistance.

## 2024-07-10 DIAGNOSIS — I639 Cerebral infarction, unspecified: Secondary | ICD-10-CM | POA: Diagnosis not present

## 2024-07-10 NOTE — Progress Notes (Signed)
 PROGRESS NOTE    [name]  FMW:[id number] DOB: 22-Jul-1966 DOA: 03/04/2024 PCP: Tanda Bleacher, MD   Brief Narrative:  [name] is a 58 year old male with medical history significant for COPD, cocaine abuse, presented to the ED 03/04/24 for slower speech, confusion and right-sided weakness, CT head showed left MCA infarct, CTA head and neck revealed an M2 occlusion on the left,   Echocardiogram revealed large mobile left ventricular thrombus on the left and right ventricular apical thrombus in the right, ejection fraction less than 20%.  Cardiology was consulted recommended DOAC.  PT evaluated patient and recommended skilled nursing facility placement.  Patient was found to lack medical decision capacity/status and not amenable to determining his discharge disposition or leave AGAINST MEDICAL ADVICE.   TOC is pursuing guardianship with APS as he is lacking medical decision capacity. He did complain of halluacinations, eating poor, refused medications, has insomnia.    Psychiatry 8/3 advised to start Latuda  for his hallucinations. Rapid response called due to altered mentation, BP lower side. Telemetry,  fluid bolus, CT head, labs ordered. 8/4 mental status back to baseline. He refused CT head. 8/5 continues to ambulate with wheel chair. Refuses vitals, meds, labs.   Assessment & Plan:   Principal Problem:   Acute CVA (cerebrovascular accident) (HCC) Active Problems:   LV (left ventricular) mural thrombus   Chronic systolic CHF (congestive heart failure) (HCC)   Impaired decision making   Prolonged QT interval   Elevated liver enzymes   COPD (chronic obstructive pulmonary disease) (HCC)   Cocaine abuse (HCC)   Schizoaffective disorder, bipolar type (HCC)   Benign essential HTN   Acid reflux   History of DVT (deep vein thrombosis)   Type 2 diabetes mellitus (HCC)   Status post bilateral BKA.   Dilated cardiomyopathy (HCC)  Acute embolic CVA Left MCA territory infarct,  caudate nucleus, lentiform nucleus and internal capsule MRI confirmed.  TEE was noted for large mobile left lower clot just under the mitral valve with an EF of 20% and a small right apical clot with reduced RV systolic function.  Continues on Xarelto .    Acute metabolic encephalopathy: 8/3 Rapid response called. IV fluid bolus 250ml x 2 ordered. BP improved, he refused CT head. Mental status back to baseline.    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  Patient on lisinopril , Coreg , Lasix  and Aldactone .   Schizoaffective disorder bipolar disorder: Lacks medical capacity. Psych recommended that the patient lacks medical capacity to make decisions if the patient attempts to leave AGAINST MEDICAL ADVICE he should be involuntarily committed.   Due to hallucinations, Psych followed 8/2 recommended Latuda  which was started on 07/03/2024 and await APS for guardianship, TOC in touch with APS regarding guardianship.  Court date for competency September first week.  Reassessed by psychiatry 07/05/2024 and per them, patient has improved.   Essential hypertension Blood pressure slightly elevated.  Continue Coreg , lisinopril , Aldactone  and Lasix .   Hyperlipidemia: Continue Crestor .   Diabetes mellitus type 2 A1c of 5.4. refuses blood sugar monitoring    Polysubstance abuse UDS was positive for cocaine and benzos. Discussed ill effects of illicit drugs.   Physical Deconditioning/right AKA and left BKA. Working on prosthesis   History of COPD Bronchodilators as needed   History of DVT On Xarelto  but frequently refuses   Acute headache Dizziness Headache better, dizziness improved with Meclizine . Need ENT eval as outpatient.   Disposition:  Pending court  date for competency (sept 8th?). Case management on board.  DVT prophylaxis: Xarelto    Code Status: Full Code  Family Communication:  None present at bedside.     Status is: Inpatient Remains inpatient appropriate because: Pending placement   Estimated body mass index is 20.09 kg/m as calculated from the following:   Height as of this encounter: 5' 9 (1.753 m).   Weight as of this encounter: 61.7 kg.    Nutritional Assessment: Body mass index is 20.09 kg/m.SABRA Seen by dietician.  I agree with the assessment and plan as outlined below: Nutrition Status:        . Skin Assessment: I have examined the patient's skin and I agree with the wound assessment as performed by the wound care RN as outlined below:    Consultants:  Psychiatry  Procedures:  None  Antimicrobials:  Anti-infectives (From admission, onward)    None         Subjective: Patient was seen and examined at bedside today.  Patient was laying comfortably in the bed.  Denied any active issues.   Objective: Vitals:   07/09/24 0405 07/09/24 1051 07/09/24 2027 07/10/24 1248  BP: (!) 131/97 136/78 113/78 (!) 148/96  Pulse: 70 84 85 72  Resp:  18 18 18   TempSrc:      SpO2: 96% 100% 100% 100%  Weight:      Height:       No intake or output data in the 24 hours ending 07/10/24 1547  Filed Weights   03/26/24 0431 03/28/24 0456 04/04/24 0500  Weight: 42.7 kg 47.5 kg 61.7 kg    Examination:  General exam: Appears calm and comfortable  Respiratory system: Clear to auscultation. Respiratory effort normal. Cardiovascular system: S1 & S2 heard, RRR. No JVD, murmurs, rubs, gallops or clicks. No pedal edema. Gastrointestinal system: Abdomen is nondistended, soft and nontender. No organomegaly or masses felt. Normal bowel sounds heard. Central nervous system: Alert and oriented. No focal neurological deficits. Extremities: Bilateral BKA.   Data Reviewed: I have personally reviewed following labs and imaging studies  CBC: No results for input(s): WBC, NEUTROABS, HGB, HCT, MCV, PLT in the last 168 hours.  Basic Metabolic Panel: No results for  input(s): NA, K, CL, CO2, GLUCOSE, BUN, CREATININE, CALCIUM , MG, PHOS in the last 168 hours.  GFR: Estimated Creatinine Clearance: 54.1 mL/min (A) (by C-G formula based on SCr of 1.33 mg/dL (H)). Liver Function Tests: No results for input(s): AST, ALT, ALKPHOS, BILITOT, PROT, ALBUMIN in the last 168 hours. No results for input(s): LIPASE, AMYLASE in the last 168 hours. No results for input(s): AMMONIA in the last 168 hours. Coagulation Profile: No results for input(s): INR, PROTIME in the last 168 hours. Cardiac Enzymes: No results for input(s): CKTOTAL, CKMB, CKMBINDEX, TROPONINI in the last 168 hours. BNP (last 3 results) No results for input(s): PROBNP in the last 8760 hours. HbA1C: No results for input(s): HGBA1C in the last 72 hours. CBG: No results for input(s): GLUCAP in the last 168 hours.  Lipid Profile: No results for input(s): CHOL, HDL, LDLCALC, TRIG, CHOLHDL, LDLDIRECT in the last 72 hours. Thyroid  Function Tests: No results for input(s): TSH, T4TOTAL, FREET4, T3FREE, THYROIDAB in the last 72 hours. Anemia Panel: No results for input(s): VITAMINB12, FOLATE, FERRITIN, TIBC, IRON, RETICCTPCT in the last 72 hours. Sepsis Labs: No results for input(s): PROCALCITON, LATICACIDVEN in the last 168 hours.  No results found for this or any previous visit (from the past 240 hours).  Radiology Studies: No results found.  Scheduled Meds:  carvedilol   10 mg Oral Daily   furosemide   40 mg Oral Daily   gabapentin   200 mg Oral BID   lisinopril   40 mg Oral Daily   lurasidone   40 mg Oral Q breakfast   rivaroxaban   20 mg Oral QHS   rosuvastatin   5 mg Oral QHS   spironolactone   25 mg Oral Daily   Continuous Infusions:  sodium chloride        LOS: 128 days   Elvan Sor, MD [facility]  07/10/2024, 3:47 PM   *Please note that this is a verbal dictation therefore any  spelling or grammatical errors are due to the Dragon Medical One system interpretation.  Please page via Amion and do not message via secure chat for urgent patient care matters. Secure chat can be used for non urgent patient care matters.  How to contact the [facility] Attending or Consulting provider 7A - 7P or covering provider during after hours 7P -7A, for this patient?  Check the care team in [facility] and look for a) attending/consulting TRH provider listed and b) the TRH team listed. Page or secure chat 7A-7P. Log into [email or web address] and use [place]'s universal password to access. If you do not have the password, please contact the hospital operator. Locate the [facility] provider you are looking for under [facility] and page to a number that you can be directly reached. If you still have difficulty reaching the provider, please page the [facility] (Director on Call) for the Hospitalists listed on amion for assistance.

## 2024-07-11 DIAGNOSIS — I639 Cerebral infarction, unspecified: Secondary | ICD-10-CM | POA: Diagnosis not present

## 2024-07-11 NOTE — Progress Notes (Signed)
 Physical Therapy Treatment Patient Details Name: [name] MRN: [id number] DOB: 1966-01-13 Today's Date: 07/11/2024   History of Present Illness Patient is 58 y.o. male presented to ED on 03/04/24 with stroke like symptoms when caregiver found him with slurred speech and right sided weakness. Head CT reveals Acute left MCA territory infarct affecting the insula, caudate nucleus, lentiform nucleus and internal capsule. PMH significant of  Schizoaffective disorder, heart failure with reduced ejection fraction, pretension, DMII, COPD, s/p bilateral amputations, chronic pain, and polysubstance abuse.    PT Comments  Continuing work on functional mobility and activity tolerance;  session focused on standing for initial RLE prosthesis socket check with Prosthetist; prior to standing, Prosthetist, padded LLE prothesis for betterfit and pressure redistribution, and [name] was able to stand within parallel bars for much longer than previously with just LLE prosthesis; Prosthetist made notes re R socket fit after 2 bouts of standing; will plan for meeting with Prosthetist next Monday   If plan is discharge home, recommend the following: Supervision due to cognitive status;Direct supervision/assist for financial management;Direct supervision/assist for medications management;Assist for transportation   Can travel by private vehicle      (soon)  Equipment Recommendations  None recommended by PT    Recommendations for Other Services Speech consult;OT consult (Prosthetist)     Precautions / Restrictions Precautions Precautions: Fall Recall of Precautions/Restrictions: Impaired Precaution/Restrictions Comments: h/o BLE amputations Required Braces or Orthoses: Other Brace Other Brace: L prosthesis; and R prosthesis with Prosthetist present to chick initial socket fit Restrictions Weight Bearing Restrictions Per Provider Order: No     Mobility  Bed Mobility                     Transfers Overall transfer level: Needs assistance Equipment used:  (Parallel bars in AIR gym) Transfers: Sit to/from Stand Sit to Stand: Contact guard assist, Min assist, Mod assist           General transfer comment: Close guard for safety standing to L prosthesis and R initial prosthesis; Prosthetist present and stabilizing R prosthesis (with mechanical knee) during transitions; min/mod assist to control descent to sit; difficulty with triggering R knee to bend second bout of standing    Ambulation/Gait                   Psychologist, counselling mobility: Yes Wheelchair propulsion: Both upper extremities Wheelchair parts: Independent Wheelchair Assistance Details (indicate cue type and reason): Noteworthy because pt had his L prosthesis on, and held the foot up off the floor the entire time   Tilt Bed    Modified Rankin (Stroke Patients Only) Modified Rankin (Stroke Patients Only) Pre-Morbid Rankin Score: Slight disability Modified Rankin: Moderate disability     Balance Overall balance assessment: Mild deficits observed, not formally tested Sitting-balance support: Bilateral upper extremity supported Sitting balance-Leahy Scale: Good       Standing balance-Leahy Scale: Poor Standing balance comment: Heavy reliance on UE support; but able to stand within parallel bars for 60 seconds each standing bout                            Communication Communication Factors Affecting Communication: Difficulty expressing self;Reduced clarity of speech  Cognition Arousal: Alert Behavior During Therapy: Flat affect  Following commands: Intact Following commands impaired: Follows one step commands inconsistently, Follows one step commands with increased time    Cueing Cueing Techniques: Verbal cues, Visual cues  Exercises      General Comments General  comments (skin integrity, edema, etc.): Prosthtist padded L prosthesis for better pressure redistribution for less pain LLE with standing      Pertinent Vitals/Pain Pain Assessment Pain Assessment: Faces Faces Pain Scale: Hurts little more Pain Location: around superior aspect of R prosthesis socket Pain Descriptors / Indicators: Discomfort, Grimacing Pain Intervention(s): Monitored during session, Other (comment) (and prosthetist took areas of pain/pressure into consideration re; fit check)    Home Living                          Prior Function            PT Goals (current goals can now be found in the care plan section) Acute Rehab PT Goals Patient Stated Goal: Agreeable to working on prosthesis training PT Goal Formulation: With patient Time For Goal Achievement: 07/12/24 Potential to Achieve Goals: Fair Progress towards PT goals: Progressing toward goals    Frequency    Min 1X/week      PT Plan      Co-evaluation              AM-PAC PT 6 Clicks Mobility   Outcome Measure  Help needed turning from your back to your side while in a flat bed without using bedrails?: None Help needed moving from lying on your back to sitting on the side of a flat bed without using bedrails?: None Help needed moving to and from a bed to a chair (including a wheelchair)?: A Little Help needed standing up from a chair using your arms (e.g., wheelchair or bedside chair)?: A Lot Help needed to walk in hospital room?: Total Help needed climbing 3-5 steps with a railing? : Total 6 Click Score: 15    End of Session   Activity Tolerance: Patient tolerated treatment well Patient left: Other (comment) (rolling in hallway) Nurse Communication: Mobility status PT Visit Diagnosis: Other abnormalities of gait and mobility (R26.89);Muscle weakness (generalized) (M62.81);Other symptoms and signs involving the nervous system (R29.898);Hemiplegia and hemiparesis Hemiplegia -  Right/Left: Right Hemiplegia - dominant/non-dominant: Dominant Hemiplegia - caused by: Cerebral infarction     Time: 1210-1300 PT Time Calculation (min) (ACUTE ONLY): 50 min  Charges:    $Therapeutic Activity: 23-37 mins $Orthotics/Prosthetics Check: 8-22 mins PT General Charges $$ ACUTE PT VISIT: 1 Visit                     [name], PT  Acute Rehabilitation Services Office [phone] Secure Chat welcomed    [name] 07/11/2024, 5:33 PM

## 2024-07-11 NOTE — Progress Notes (Signed)
 PROGRESS NOTE    [name]  FMW:[id number] DOB: 07-28-1966 DOA: 03/04/2024 PCP: Tanda Bleacher, MD   Brief Narrative:   58 year old male with medical history significant for COPD, cocaine abuse, presented to the ED 03/04/24 for slower speech, confusion and right-sided weakness, CT head showed left MCA infarct, CTA head and neck revealed an M2 occlusion on the left,   Echocardiogram revealed large mobile left ventricular thrombus on the left and right ventricular apical thrombus in the right, ejection fraction less than 20%.  Cardiology was consulted recommended DOAC.  PT evaluated patient and recommended skilled nursing facility placement.  Patient was found to lack medical decision capacity/status and not amenable to determining his discharge disposition or leave AGAINST MEDICAL ADVICE.   TOC is pursuing guardianship with APS as he is lacking medical decision capacity. He did complain of halluacinations, eating poor, refused medications, has insomnia.   Assessment & Plan:  Principal Problem:   Acute CVA (cerebrovascular accident) (HCC) Active Problems:   LV (left ventricular) mural thrombus   Chronic systolic CHF (congestive heart failure) (HCC)   Impaired decision making   Prolonged QT interval   Elevated liver enzymes   COPD (chronic obstructive pulmonary disease) (HCC)   Cocaine abuse (HCC)   Schizoaffective disorder, bipolar type (HCC)   Benign essential HTN   Acid reflux   History of DVT (deep vein thrombosis)   Type 2 diabetes mellitus (HCC)   Status post bilateral BKA.   Dilated cardiomyopathy (HCC)   Acute ischemic CVA: -He underwent extensive workup and imaging revealed left MCA territory infarct, caudate nucleus, lentiform nucleus and internal capsule MRI confirmed. -TEE was noted for large mobile left lower clot just under the mitral valve with an EF of 20% and a small right apical clot with reduced RV systolic function. -He was started on Eliquis  for intracardiac  thrombus, however he remained noncompliant.  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  Eliquis  changed to Xarelto , Coreg  long-acting.   Schizoaffective disorder bipolar disorder lacks medical capacity: Psych was consulted and recommended that the patient lacks medical capacity to make decisions if the patient attempts to leave AGAINST MEDICAL ADVICE he should be involuntarily committed. Psych recommending APS guardianship, TOC in touch with APS regarding this as patient lacks medical capacity Due to hallucinations, Psych followed 8/2 recommended Latuda  which was started on 07/03/2024 and await APS for guardianship, TOC in touch with APS regarding guardianship. Court date for competency September first week. Reassessed by psychiatry 07/05/2024 and per them, patient has improved.    Essential hypertension:  on Coreg , Lasix , losartan , Aldactone .  Intermittent noncompliance   Hyperlipidemia: Continue Crestor .   Diabetes mellitus type 2:  last A1c of 5.4. refuses monitoring    Polysubstance abuse: UDS was positive for cocaine and benzos.   Physical Deconditioning: Ambulatory status complicated by previous right AKA and left BKA. - LLE prosthesis has been delivered.   History of COPD: Continue inhalers   History of DVT: On Xarelto  but frequently refuses   DVT prophylaxis:  rivaroxaban  (XARELTO ) tablet 20 mg     Code Status: Full Code Family Communication:   Status is: Inpatient Remains inpatient appropriate because: Awaiting disposition  Subjective:  No acute events overnight. Resting in the bed  comfortably.   Examination:  Constitutional: NAD, calm, comfortable Eyes: PERRL, lids and conjunctivae normal ENMT: Mucous membranes are moist. Posterior pharynx clear of any exudate or lesions.Normal dentition.  Neck: normal, supple, no masses, no thyromegaly Respiratory: clear to auscultation bilaterally, no wheezing, no crackles. Normal respiratory effort. No accessory muscle use.  Cardiovascular: Regular rate and rhythm, no murmurs / rubs / gallops. No extremity edema. 2+ pedal pulses. No carotid bruits.  Abdomen: no tenderness, no masses palpated. No hepatosplenomegaly. Bowel sounds positive.  Musculoskeletal: no clubbing / cyanosis. Skin: no rashes, lesions, ulcers. No induration Neurologic: CN 2-12 grossly intact. S/p Left BKA and right AKA      Diet Orders (From admission, onward)     Start     Ordered   04/07/24 1155  Diet regular Fluid consistency: Thin  Diet effective now       Question:  Fluid consistency:  Answer:  Thin   04/07/24 1154   03/14/24 0000  Diet - low sodium heart healthy        03/14/24 1443            Objective: Vitals:   07/10/24 1248 07/10/24 2004 07/10/24 2005 07/11/24 0755  BP: (!) 148/96 118/76  104/65  Pulse: 72 (!) 32 78 67  Resp: 18 18  17   TempSrc:      SpO2: 100% 90% 100% 100%  Weight:      Height:       No intake or output data in the 24 hours ending 07/11/24 1015 Filed Weights   03/26/24 0431 03/28/24 0456 04/04/24 0500  Weight: 42.7 kg 47.5 kg 61.7 kg    Scheduled Meds:  carvedilol   10 mg Oral Daily   furosemide   40 mg Oral Daily   gabapentin   200 mg Oral BID   lisinopril   40 mg Oral Daily   lurasidone   40 mg Oral Q breakfast   rivaroxaban   20 mg Oral QHS   rosuvastatin   5 mg Oral QHS   spironolactone   25 mg Oral Daily   Continuous Infusions:  sodium chloride       Nutritional status     Body mass index is 20.09 kg/m.  Data Reviewed:   CBC: No results for input(s): WBC, NEUTROABS, HGB, HCT, MCV,  PLT in the last 168 hours. Basic Metabolic Panel: No results for input(s): NA, K, CL, CO2, GLUCOSE, BUN, CREATININE, CALCIUM , MG, PHOS in the last 168 hours. GFR: Estimated Creatinine Clearance: 54.1 mL/min (A) (by C-G formula based on SCr of 1.33 mg/dL (H)). Liver Function Tests: No results for input(s): AST, ALT, ALKPHOS, BILITOT, PROT, ALBUMIN in the last 168 hours. No results for input(s): LIPASE, AMYLASE in the last 168 hours. No results for input(s): AMMONIA in the last 168 hours. Coagulation Profile: No results for input(s): INR, PROTIME in the last 168 hours. Cardiac Enzymes: No results for input(s): CKTOTAL, CKMB, CKMBINDEX, TROPONINI in the last 168 hours. BNP (last 3 results) No results for input(s): PROBNP in the last 8760 hours. HbA1C: No results for input(s): HGBA1C in the last 72 hours. CBG: No results for input(s): GLUCAP in the last 168 hours. Lipid Profile: No results for input(s): CHOL, HDL, LDLCALC, TRIG, CHOLHDL, LDLDIRECT in the last 72 hours. Thyroid  Function Tests: No results for input(s): TSH, T4TOTAL, FREET4, T3FREE, THYROIDAB in the last 72 hours. Anemia Panel: No results for input(s): VITAMINB12, FOLATE, FERRITIN, TIBC, IRON, RETICCTPCT in the last 72 hours.  Sepsis Labs: No results for input(s): PROCALCITON, LATICACIDVEN in the last 168 hours.  No results found for this or any previous visit (from the past 240 hours).       Radiology Studies: No results found.         LOS: 129 days   Time spent= 37 mins    Deliliah Room, MD [facility]  If 7PM-7AM, please contact night-coverage  07/11/2024, 10:15 AM

## 2024-07-11 NOTE — Plan of Care (Signed)
  Problem: Nutrition: Goal: Adequate nutrition will be maintained Outcome: Progressing   Problem: Coping: Goal: Level of anxiety will decrease Outcome: Progressing   Problem: Elimination: Goal: Will not experience complications related to bowel motility Outcome: Progressing Goal: Will not experience complications related to urinary retention Outcome: Progressing   Problem: Pain Managment: Goal: General experience of comfort will improve and/or be controlled Outcome: Progressing   Problem: Safety: Goal: Ability to remain free from injury will improve Outcome: Progressing   Problem: Skin Integrity: Goal: Risk for impaired skin integrity will decrease Outcome: Progressing

## 2024-07-12 DIAGNOSIS — I639 Cerebral infarction, unspecified: Secondary | ICD-10-CM | POA: Diagnosis not present

## 2024-07-12 NOTE — Progress Notes (Signed)
 Progress Note   Patient: [name] FMW:[id number] DOB: [birth year] DOA: 03/04/2024     130 DOS: the patient was seen and examined on 07/12/2024   Brief hospital course: 58 year old male with medical history significant for COPD, cocaine abuse, presented to the ED 03/04/24 for slower speech, confusion and right-sided weakness, CT head showed left MCA infarct, CTA head and neck revealed an M2 occlusion on the left,   Echocardiogram revealed large mobile left ventricular thrombus on the left and right ventricular apical thrombus in the right, ejection fraction less than 20%.  Cardiology was consulted recommended DOAC.  PT evaluated patient and recommended skilled nursing facility placement.  Patient was found to lack medical decision capacity/status and not amenable to determining his discharge disposition or leave AGAINST MEDICAL ADVICE.   TOC is pursuing guardianship with APS as he is lacking medical decision capacity. He did complain of halluacinations, eating poor, refused medications, has insomnia.   Assessment & Plan:  Acute CVA (cerebrovascular accident) (HCC) -He underwent extensive workup and imaging revealed left MCA territory infarct, caudate nucleus, lentiform nucleus and internal capsule MRI confirmed. -TEE was noted for large mobile left lower clot just under the mitral valve with an EF of 20% and a small right apical clot with reduced RV systolic function. -Psychiatry has deemed the patient to lack capacity; he continues to remain noncompliant at times -Unable to force meds.  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 Psych recommending APS guardianship, TOC in touch with APS regarding this as patient lacks medical capacity   Essential hypertension:  - Coreg  CR 24 hr 10 mg PO daily - Lisinopril  40 mg PO daily   - Aldactone  25 mg PO daily  - Hydralazine  25 mg PO q6 hr PRN  - IV labetalol  20 mg q3 hr PRN   Hyperlipidemia: - Crestor  5 mg PO at bedtime    Diabetes mellitus type 2: - last A1c of 5.4. refuses monitoring  - Ensure enlive PO bid  - Gabapentin  200 mg PO bid    Polysubstance abuse: - Ativan  1 mg PO q6 hr PRN  - Latuda  40 mg PO daily  - Oxycodone  10 mg PO q4 hr PRN    Deconditioning: Ambulatory status complicated by previous right AKA and left BKA has no insurance coverage for SNF. - refusing therapy at times - Meclizine  25 mg PO tid PRN    History of COPD: - Albuterol  q6 hr PRN nebs   History of DVT: - Xarelto  20 mg PO daily      Subjective: Pt seen and examined at the bedside. Awaiting safe disposition.   Physical Exam: Vitals:   07/11/24 0755 07/11/24 2016 07/11/24 2307 07/12/24 1145  BP: 104/65 113/81 (!) 155/105 (!) 144/99  Pulse: 67 87 76 73  Resp: 17 18 18 17   TempSrc:      SpO2: 100% 100% 100% 100%  Weight:      Height:       Physical Exam HENT:     Head: Normocephalic.  Cardiovascular:     Rate and Rhythm: Normal rate.  Pulmonary:     Effort: Pulmonary effort is normal.  Abdominal:     Palpations: Abdomen is soft.  Musculoskeletal:        General: Normal range of motion.  Skin:    General: Skin is warm.  Neurological:     Mental Status: He is alert. Mental status is at baseline.       Disposition: Status is: Inpatient Remains inpatient appropriate because: Awaiting safe disposition   Planned Discharge Destination: Barriers to discharge: As noted above     Time spent: 35 minutes  Author: Shemika Robbs , MD 07/12/2024 2:32 PM  For on call review [email or web address].

## 2024-07-13 DIAGNOSIS — I639 Cerebral infarction, unspecified: Secondary | ICD-10-CM | POA: Diagnosis not present

## 2024-07-13 LAB — COMPREHENSIVE METABOLIC PANEL WITH GFR
ALT: 20 U/L (ref 0–44)
AST: 24 U/L (ref 15–41)
Albumin: 3.5 g/dL (ref 3.5–5.0)
Alkaline Phosphatase: 57 U/L (ref 38–126)
Anion gap: 9 (ref 5–15)
BUN: 12 mg/dL (ref 6–20)
CO2: 24 mmol/L (ref 22–32)
Calcium: 9.1 mg/dL (ref 8.9–10.3)
Chloride: 105 mmol/L (ref 98–111)
Creatinine, Ser: 1.12 mg/dL (ref 0.61–1.24)
GFR, Estimated: >60 mL/min (ref >60)
Glucose, Bld: 116 mg/dL — ABNORMAL HIGH (ref 70–99)
Potassium: 4.2 mmol/L (ref 3.5–5.1)
Sodium: 138 mmol/L (ref 135–145)
Total Bilirubin: 0.9 mg/dL (ref 0.0–1.2)
Total Protein: 6.6 g/dL (ref 6.5–8.1)

## 2024-07-13 LAB — CBC
HCT: 41.9 % (ref 39.0–52.0)
Hemoglobin: 14.3 g/dL (ref 13.0–17.0)
MCH: 31 pg (ref 26.0–34.0)
MCHC: 34.1 g/dL (ref 30.0–36.0)
MCV: 90.9 fL (ref 80.0–100.0)
Platelets: 207 K/uL (ref 150–400)
RBC: 4.61 MIL/uL (ref 4.22–5.81)
RDW: 14.6 % (ref 11.5–15.5)
WBC: 5.8 K/uL (ref 4.0–10.5)
nRBC: 0 % (ref 0.0–0.2)

## 2024-07-13 LAB — MAGNESIUM: Magnesium: 1.8 mg/dL (ref 1.7–2.4)

## 2024-07-13 NOTE — Plan of Care (Signed)
  Problem: Nutrition: Goal: Adequate nutrition will be maintained Outcome: Progressing   Problem: Coping: Goal: Level of anxiety will decrease Outcome: Progressing   Problem: Elimination: Goal: Will not experience complications related to bowel motility Outcome: Progressing Goal: Will not experience complications related to urinary retention Outcome: Progressing   Problem: Pain Managment: Goal: General experience of comfort will improve and/or be controlled Outcome: Progressing   Problem: Safety: Goal: Ability to remain free from injury will improve Outcome: Progressing   Problem: Skin Integrity: Goal: Risk for impaired skin integrity will decrease Outcome: Progressing

## 2024-07-13 NOTE — Progress Notes (Signed)
 Progress Note   Patient: [name] FMW:[id number] DOB: 1966-04-07 DOA: 03/04/2024     131 DOS: the patient was seen and examined on 07/13/2024   Brief hospital course: 58 year old male with medical history significant for COPD, cocaine abuse, presented to the ED 03/04/24 for slower speech, confusion and right-sided weakness, CT head showed left MCA infarct, CTA head and neck revealed an M2 occlusion on the left,   Echocardiogram revealed large mobile left ventricular thrombus on the left and right ventricular apical thrombus in the right, ejection fraction less than 20%.  Cardiology was consulted recommended DOAC.  PT evaluated patient and recommended skilled nursing facility placement.  Patient was found to lack medical decision capacity/status and not amenable to determining his discharge disposition or leave AGAINST MEDICAL ADVICE.   TOC is pursuing guardianship with APS as he is lacking medical decision capacity. He did complain of halluacinations, eating poor, refused medications, has insomnia.   Assessment & Plan:   Acute CVA (cerebrovascular accident) (HCC) -He underwent extensive workup and imaging revealed left MCA territory infarct, caudate nucleus, lentiform nucleus and internal capsule MRI confirmed. -TEE was noted for large mobile left lower clot just under the mitral valve with an EF of 20% and a small right apical clot with reduced RV systolic function. -Psychiatry has deemed the patient to lack capacity; he continues to remain noncompliant at times -Unable to force meds.  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 Psych recommending APS guardianship, TOC in touch with APS regarding this as patient lacks medical capacity   Essential hypertension:  - Coreg  CR 24 hr 10 mg PO daily - Lisinopril  40 mg PO daily   - Aldactone  25 mg PO daily  - Hydralazine  25 mg PO q6 hr PRN  - IV labetalol  20 mg q3 hr PRN    Hyperlipidemia: - Crestor  5 mg PO at bedtime    Diabetes mellitus type 2: - last A1c of 5.4. refuses monitoring  - Ensure enlive PO bid  - Gabapentin  200 mg PO bid    Polysubstance abuse: - Ativan  1 mg PO q6 hr PRN  - Latuda  40 mg PO daily  - Oxycodone  10 mg PO q4 hr PRN    Deconditioning: Ambulatory status complicated by previous right AKA and left BKA has no insurance coverage for SNF. - refusing therapy at times - Meclizine  25 mg PO tid PRN    History of COPD: - Albuterol  q6 hr PRN nebs   History of DVT: - Xarelto  20 mg PO daily   Subjective: Pt seen and examined at the bedside. Awaiting safe disposition.  Physical Exam: Vitals:   07/11/24 2307 07/12/24 1145 07/12/24 2250 07/13/24 0754  BP: (!) 155/105 (!) 144/99 (!) 126/94 123/86  Pulse: 76 73 89 69  Resp: 18 17 18 19   TempSrc:      SpO2: 100% 100% 99% 100%  Weight:      Height:       HENT:     Head: Normocephalic.  Cardiovascular:     Rate and Rhythm: Normal rate.  Pulmonary:  Effort: Pulmonary effort is normal.  Abdominal:     Palpations: Abdomen is soft.  Musculoskeletal:        General: Normal range of motion.  Skin:    General: Skin is warm.  Neurological:     Mental Status: He is alert. Mental status is at baseline.     Disposition: Status is: Inpatient Remains inpatient appropriate because: Awaiting safe disposition   Planned Discharge Destination: Barriers to discharge: As above     Time spent: 35 minutes  Author: Clarance Bollard , MD 07/13/2024 11:27 AM  For on call review [email or web address].

## 2024-07-14 DIAGNOSIS — I639 Cerebral infarction, unspecified: Secondary | ICD-10-CM | POA: Diagnosis not present

## 2024-07-14 NOTE — Consult Note (Signed)
 [facility] Consult Follow-up  Patient Name: [name]  MRN: [id number]  DOB: [birth year]  Consult Order details:  Orders (From admission, onward)     Start     Ordered   06/28/24 1642  IP CONSULT TO PSYCHIATRY       Ordering Provider: Elpidio Reyes DEL, MD  Provider:  (Not yet assigned)  Question Answer Comment  Location [facility]   Reason for Consult? Question about improved capacity now that patient has been receiving treatment in house.  Patient's attorney requested repeat psych consult for possible discharge planning      06/28/24 1642   04/05/24 1343  IP CONSULT TO PSYCHIATRY       Ordering Provider: Judeth Trenda BIRCH, MD  Provider:  (Not yet assigned)  Question Answer Comment  Location [facility]   Reason for Consult? Acute stroke, embolic from large intracardiac clots, refusing Eliquis .  Schizoaffective d/o, lacks MDM capacity, has consistently refused to take Latuda .  ?  parenteral/IM, possibly long-acting [facility] medications.      04/05/24 1346   03/30/24 1323  IP CONSULT TO PSYCHIATRY       Comments: 71M, lived alone, B/L amputee, wheelchair, P/W acute left MCA stroke, large cardiac thrombus, through course of hospital admission with issues of agitation, refusal of care, refusing SNF, felt unsafe to DC home and documentation regarding lack of capacity (however did not specifically see this addressed in multiple psychiatry evaluations), currently medically stable awaiting disposition. APS contacted.  Kindly refer to [facility] MDs note from 4/30 regarding need for capacity evaluation.  Ordering Provider: Judeth Trenda BIRCH, MD  Provider:  (Not yet assigned)  Question Answer Comment  Location [facility]   Reason for Consult? Kindly assist with capacity evaluation> details in comments.      03/30/24 1323   03/14/24 1515  IP CONSULT TO PSYCHIATRY       Ordering Provider: Elpidio Reyes DEL, MD  Provider:  (Not yet  assigned)  Question Answer Comment  Location [facility]   Reason for Consult? Capacity evaluation -- patient verbally abusive, wants to go home, but oriented only to self.  Lives alone, seems to be unable to care for himself at home.  Essentially refusing all cares here      03/14/24 1514             Mode of Visit: In person    Psychiatry Consult Evaluation  Service Date: July 14, 2024 LOS:  LOS: 132 days  Chief Complaint I have been hearing voices telling me to kill myself.   Primary Psychiatric Diagnoses  Hx Schizoaffective disorder unspecified versus hx Substance induced psychosis/mood disorder versus hx MDD 2.  Stimulant use disorder (cocaine), in controlled environment  Assessment  [name] is a 58 y.o. male admitted: Medically on 03/04/2024  2:31 PM for residual stroke with prolonged hospitalization due to inability to care for self. 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   Seen numerous times by psychiatry at this point. Case summary and conceptualization:  Middle age male with significant substance use issues (possible schizoaffective disorder), apparent medication non-compliance, and severe medical problems (HF w EF < 20 and repeat CVA's with large current LA thrombus). Psychiatry consulted recently for capacity eval.  Patient demonstrated fair cognition during the eval but it was decided that since the patient has been appointed a guardian ad litem and has an upcoming competency hearing, he can not be determined to have capacity to leave the hospital (at least at that time on 7/31). Psychiatry signed off.   Psychiatry consulted again on 8/3 to address medication non-compliance. There was concern  noted for psychotic symptoms. Started on Latuda  40 mg daily by Dr. Sable covering the consult service.    07/04/24:  Rapid response called on pt yesterday for sudden slumping over with minimal responsiveness without noted shaking. BP was 63/51, fluids, CT head and cardiac monitoring ordered. Pt was fully alert within the next 2 hours, and refused CT. EKG was also refused this morning. Pt may not be eating all of his food, possibly interfering with the absorption of Latuda  which requires 350cal with intake for proper absorption. Pt presented this morning alert and oriented and did not show any overt signs of psychosis. Denies SI, HI, A&VH. Will not change medications at this time. Will follow up tomorrow and read nursing notes for further signs of paranoia.  8/5: Patient pleasant and appropriate today. He denies having had any perceptual disturbances since starting Latuda . He asks several questions about his medical care and guardianship, which I answered to the best of my ability. I deferred much of that discussion to the primary medical team. I did ask about his refusal of medication yesterday and he says there was a misunderstanding. He says he wants to take his medications each day. He appears to be doing well on Latuda . We will continue to follow the patient via chart review with in person visits approximately weekly.   8/14: Weekly check in. Irritable. Denies psychosis symptoms. No complaints. Will continue Latuda .  Diagnoses:  Active Hospital problems: Principal Problem:   Acute CVA (cerebrovascular accident) [facility]) Active Problems:   Schizoaffective disorder, bipolar type (HCC)   Cocaine abuse (HCC)   Type 2 diabetes mellitus (HCC)   Prolonged QT interval   Acid reflux   History of DVT (deep vein thrombosis)   Status post bilateral BKA.   COPD (chronic obstructive pulmonary disease) (HCC)   Chronic systolic CHF (congestive heart failure) (HCC)   Benign essential HTN   LV (left  ventricular) mural thrombus   Elevated liver enzymes   Dilated cardiomyopathy (HCC)   Impaired decision making    Plan   ## Psychiatric Medication Recommendations:  --Continue Latuda  40 mg every morning with breakfast for psychosis-with plan to up titrated as needed. --Continue Gabapentin  200 mg twice daily   ## Medical Decision Making Capacity: will not make an assessment on capacity in the setting of having pending competency evaluation by court, scheduled for Sept.   ## Further Work-up:  -- Latuda  has very low propensity to prolong QT -- given long QT -> optimize K 4.0, Mg 2.0, and benefit-risk with all QT prolonging meds -- most recent EKG on 06/29/2024 had QtC of 511  -- Pertinent labwork reviewed earlier this admission   ## Disposition:-- There are no psychiatric contraindications to discharge at this time. Discharge is pending competency evaluation or other legally appropriate means of discharge. Please contact ethics team if discharge is planned prior to competency evaluation.   ## Behavioral / Environmental: - No specific recommendations at this time.     ## Safety and Observation Level:  - Based on my clinical evaluation, I estimate the patient to be at low risk of self harm  in the current setting. - At this time, we recommend  routine. This decision is based on my review of the chart including patient's history and current presentation, interview of the patient, mental status examination, and consideration of suicide risk including evaluating suicidal ideation, plan, intent, suicidal or self-harm behaviors, risk factors, and protective factors. This judgment is based on our ability to directly address suicide risk, implement suicide prevention strategies, and develop a safety plan while the patient is in the clinical setting. Please contact our team if there is a concern that risk level has changed.  CSSR Risk Category:C-SSRS RISK CATEGORY: No Risk  Suicide Risk  Assessment: Patient has following modifiable risk factors for suicide: medication noncompliance, which we are addressing by competency evaluation to get pt guardian and access to resources. Patient has following non-modifiable or demographic risk factors for suicide: male gender, history of suicide attempt, and psychiatric hospitalization Patient has the following protective factors against suicide: in controlled environment and not currently experiencing any acute psychiatric concerns.   Thank you for this consult request. Recommendations have been communicated to the primary team.  We will continue to follow up for now.     History of Present Illness  Relevant Aspects of [facility] Course:  Admitted on 03/04/2024 for strokelike symptoms.  Patient was diagnosed with left MCA territory stroke which involve numerous structures.  Patient had residual deficits including right-sided weakness and cognitive impairment. Echo showing cardiac clot.  Patient Report:   From capacity assessment on 7/31: When asking patient what he would do if he were to hypothetically leave the hospital today, he reports he does not know.  When asking him where he would stay, he report that he knows people but on further questioning reports that he does not know and says that he would talk to [facility].  Patient reports that he had given his key to [name], nurse with ACT team, and currently is unaware if he has access to his apartment still.  Patient reports that he is followed by [facility] and they frequently follow him to check on his mental health and other aspects of his health care.  Asked patient if he will continue to use cocaine after discharge and he adamantly reports no.  Asked patient what he would do if he had any medical concerns, and he reported he would call out for help.  Following this discussion, patient reports that he could stay at a halfway house and therapy people there for him to get help  from if he has any medical concerns.  Talked to patient about ADLs, and he reports that he is able to feed himself, use the bathroom by himself, and shower himself.  He then reports I cannot do everything myself.  When discussing who has helped in the past patient reports [name], [facility].  Asked patient if he had any family members or friends that he speaks with, and he reports he does not.  Asked patient if he could make the decision of leaving today and taking care of himself or waiting in the hospital until court evaluation and getting resources to help him function including group home, and he is unable to provide a meaningful answer for this.  Psych ROS:  Depression: Does not describe depressive symptoms.  Denies suicidal thoughts. Anxiety: Did not describe significant anxiety symptoms. Mania (lifetime and current): Does not describe recent manic symptoms. Psychosis: (lifetime and current): Endorses some symptoms of psychosis outside of cocaine use in the  past but denies any symptoms psychosis since hospitalization in April and being off of secondary medications and not using cocaine.  Collateral information:  [name], guardian ad litum, [phone]: Call and left HIPAA compliant voicemail.   Psychiatric and Social History  Psychiatric History:  Information collected from patient and chart  Prev Dx/Sx: Schizoaffective disorder Current Psych Provider: ACT team Home Meds (current): Abilify  5 mg twice a day and hydroxyzine  25 mg every 6 hours as needed Previous Med Trials: Multiple trials Therapy: Currently followed up ACT team   Prior Psych Hospitalization: Last hospitalization was at [facility] in October 2024 Prior Self Harm: History of suicidal behavior Prior Violence: History of agitation and carrying knife/scapel   Family Psych History: Unknown Family Hx suicide: Unknown  Social History:  Not discussed during this encounter.  Not much information per chart review.   Patient did appear to live at her apartment alone prior to current admission.  Patient is followed by ACT team. Access to weapons/lethal means: Denies  Substance History Cocaine use disorder and hx of rehab. Also history of alc use but w/o complications. No other pertinent substance hx.   Exam Findings  Physical Exam:  Vital Signs:  Pulse Rate:  [81] 81 (08/13 2045) Resp:  [15] 15 (08/13 2045) BP: (131)/(84) 131/84 (08/13 2045) SpO2:  [100 %] 100 % (08/13 2045) Blood pressure 131/84, pulse 81, temperature 98.5 F (36.9 C), resp. rate 15, height 5' 9 (1.753 m), weight 61.7 kg, SpO2 100%. Body mass index is 20.09 kg/m.  Physical Exam Vitals and nursing note reviewed.  HENT:     Head: Normocephalic and atraumatic.  Pulmonary:     Effort: Pulmonary effort is normal.  Musculoskeletal:     Comments: Bilateral lower extremity amputations.  Neurological:     General: No focal deficit present.     Mental Status: He is alert.     Mental Status Exam: General Appearance: Casual and Fairly Groomed.   Orientation:  Full (Time, Place, and Person)  Memory:  Recent;   Good Remote;   Fair  Concentration:  Concentration: Good  Recall:  Good  Attention  Fair  Eye Contact:  Good  Speech:  nml  Language:  Fair  Volume:  Normal  Mood: okay  Affect:  Constricted.   Thought Process:  logical  Thought Content: nml  Suicidal Thoughts:  No  Homicidal Thoughts:  No  Judgement:  Fair  Insight:  Fair  Psychomotor Activity:  Normal  Akathisia:  Negative  Fund of Knowledge:  Fair, lower end of normal. Unclear if chronic or since his CVA.       Assets:  Communication Skills Desire for Improvement Financial Resources/Insurance Leisure Time  Cognition: On brief assessment intact executive functioning.  ADL's:  Intact, subjective per patient; SNF per OT  AIMS (if indicated):        Other History   These have been pulled in through the EMR, reviewed, and updated if appropriate.   Family History:  The patient's family history includes Drug abuse in his mother; Suicidality in his mother.  Medical History: Past Medical History:  Diagnosis Date   Asthma    Bipolar 1 disorder (HCC)    CHF (congestive heart failure) (HCC)    COPD (chronic obstructive pulmonary disease) (HCC)    Depression    Diabetes mellitus without complication (HCC)    GERD (gastroesophageal reflux disease)    Homelessness    Hypertension    QT prolongation    Schizoaffective disorder (  [facility])     Surgical History: Past Surgical History:  Procedure Laterality Date   AMPUTATION Left 12/10/2022   Procedure: LEFT BELOW KNEE AMPUTATION;  Surgeon: Harden Jerona GAILS, MD;  Location: [facility] OR;  Service: Orthopedics;  Laterality: Left;   AMPUTATION TOE Bilateral 01/02/2021   Procedure: AMPUTATION TOE;  Surgeon: Gretel Ozell PARAS, DPM;  Location: [facility] OR;  Service: Podiatry;  Laterality: Bilateral;  Ok to leave patient on hospital bed   IR ANGIO INTRA EXTRACRAN SEL COM CAROTID INNOMINATE BILAT MOD SED  12/17/2020   IR ANGIO VERTEBRAL SEL VERTEBRAL BILAT MOD SED  12/17/2020   MULTIPLE EXTRACTIONS WITH ALVEOLOPLASTY N/A 03/25/2023   Procedure: IRRIGATION AND DEBRIDEMENT, MULTIPLE EXTRACTION WITH ALVEOLOPLASTY X 4 , EXTRACTIONS 4 THROUGH 13, 20 THROUGHT 29;  Surgeon: Joanette Soulier, DMD;  Location: [facility] OR;  Service: Oral Surgery;  Laterality: N/A;   RADIOLOGY WITH ANESTHESIA N/A 12/17/2020   Procedure: IR WITH ANESTHESIA;  Surgeon: Radiologist, Medication, MD;  Location: [facility] OR;  Service: Radiology;  Laterality: N/A;     Medications:   Current Facility-Administered Medications:    albuterol  (PROVENTIL ) (2.5 MG/3ML) 0.083% nebulizer solution 2.5 mg, 2.5 mg, Nebulization, Q6H PRN, Danton Reyes DASEN, MD, 2.5 mg at 06/05/24 0840   carvedilol  (COREG  CR) 24 hr capsule 10 mg, 10 mg, Oral, Daily, Lenon Pons L, MD, 10 mg at 07/14/24 1040   feeding supplement (ENSURE ENLIVE / ENSURE PLUS) liquid 237 mL, 237 mL, Oral, BID BM  PRN, Danton Reyes DASEN, MD   furosemide  (LASIX ) tablet 40 mg, 40 mg, Oral, Daily, Deering, Hao, [place], 40 mg at 07/13/24 1137   gabapentin  (NEURONTIN ) capsule 200 mg, 200 mg, Oral, BID, Lord, Jamison Y, NP, 200 mg at 07/14/24 1040   hydrALAZINE  (APRESOLINE ) tablet 25 mg, 25 mg, Oral, Q6H PRN, Sundil, Subrina, MD, 25 mg at 07/07/24 0044   labetalol  (NORMODYNE ) injection 20 mg, 20 mg, Intravenous, Q3H PRN, Mansy, Jan A, MD, 20 mg at 05/24/24 2250   lactulose  (CHRONULAC ) 10 GM/15ML solution 20 g, 20 g, Oral, BID PRN, Akula, Vijaya, MD   lisinopril  (ZESTRIL ) tablet 40 mg, 40 mg, Oral, Daily, Odell Castor, Erle, MD, 40 mg at 07/14/24 1040   LORazepam  (ATIVAN ) tablet 1 mg, 1 mg, Oral, Q6H PRN, Sreeram, Narendranath, MD   lurasidone  (LATUDA ) tablet 40 mg, 40 mg, Oral, Q breakfast, Akintayo, Musa A, MD, 40 mg at 07/14/24 1040   meclizine  (ANTIVERT ) tablet 25 mg, 25 mg, Oral, TID PRN, Sreeram, Narendranath, MD, 25 mg at 07/14/24 1044   Oral care mouth rinse, 15 mL, Mouth Rinse, PRN, Regalado, Belkys A, MD   oxyCODONE  (Oxy IR/ROXICODONE ) immediate release tablet 10 mg, 10 mg, Oral, Q4H PRN, Danton Reyes DASEN, MD, 10 mg at 07/14/24 0424   rivaroxaban  (XARELTO ) tablet 20 mg, 20 mg, Oral, QHS, Ogbata, Sylvester I, MD, 20 mg at 07/13/24 2122   rosuvastatin  (CRESTOR ) tablet 5 mg, 5 mg, Oral, QHS, Desai, Nikita S, MD, 5 mg at 07/13/24 2122   spironolactone  (ALDACTONE ) tablet 25 mg, 25 mg, Oral, Daily, Pahwani, Fredia, MD, 25 mg at 07/14/24 1040  Allergies: Allergies  Allergen Reactions   Acetaminophen  Hives, Nausea And Vomiting, Rash and Anaphylaxis    PT states that if he takes this he is going to the hospital   Motrin  [Ibuprofen ] Anaphylaxis   Nsaids Anaphylaxis    PT states that if he takes this he is going to the hospital   Lyrica  Cr [Pregabalin  Er] Other (See Comments)    Dystonia Dysarthria  Peanut-Containing Drug Products Other (See Comments)    Pt denies allergy to peanut butter 03/02/2021    Karleen Kaufmann, MD PGY-4

## 2024-07-14 NOTE — Progress Notes (Signed)
 PROGRESS NOTE    [name]  FMW:[id number] DOB: [birth year] DOA: 03/04/2024 PCP: Tanda Bleacher, MD  Brief Narrative:  This 58 year old male with medical history significant for COPD, cocaine abuse, presented to the ED 03/04/24 for slower speech, confusion and right-sided weakness, CT head showed left MCA infarct, CTA head and neck revealed an M2 occlusion on the left,   Echocardiogram revealed large mobile left ventricular thrombus on the left and right ventricular apical thrombus in the right, ejection fraction less than 20%.  Cardiology was consulted recommended DOAC.  PT evaluated patient and recommended skilled nursing facility placement.  Patient was found to lack medical decision capacity/status and not amenable to determining his discharge disposition or leave AGAINST MEDICAL ADVICE.   TOC is pursuing guardianship with APS as he is lacking medical decision capacity. He did complain of halluacinations, eating poor, refused medications, has insomnia  Assessment & Plan:   Principal Problem:   Acute CVA (cerebrovascular accident) (HCC) Active Problems:   LV (left ventricular) mural thrombus   Chronic systolic CHF (congestive heart failure) (HCC)   Impaired decision making   Prolonged QT interval   Elevated liver enzymes   COPD (chronic obstructive pulmonary disease) (HCC)   Cocaine abuse (HCC)   Schizoaffective disorder, bipolar type (HCC)   Benign essential HTN   Acid reflux   History of DVT (deep vein thrombosis)   Type 2 diabetes mellitus (HCC)   Status post bilateral BKA.   Dilated cardiomyopathy (HCC)  Acute CVA (cerebrovascular accident) [facility]) He underwent extensive workup and imaging revealed left MCA territory infarct, caudate nucleus, lentiform nucleus and internal capsule confirmed on MRI. TEE was noted for large mobile left lower clot just under the mitral valve with an EF of 20% and a small right apical clot with reduced RV systolic function. Psychiatry has deemed  the patient to lack capacity; he continues to remain noncompliant at times. Unable to force meds.  Continue offering throughout the day as able.   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 Psych recommending APS guardianship, TOC in touch with APS regarding this as patient lacks medical capacity.   Essential hypertension:  - Coreg  CR 24 hr 10 mg PO daily - Lisinopril  40 mg PO daily   - Aldactone  25 mg PO daily  - Hydralazine  25 mg PO q6 hr PRN  - IV labetalol  20 mg q3 hr PRN    Hyperlipidemia: Continue Crestor  5 mg PO at bedtime.   Diabetes mellitus type 2: - last A1c of 5.4. refuses monitoring  - Ensure enlive PO bid  - Gabapentin  200 mg PO bid    Polysubstance abuse: - Ativan  1 mg PO q6 hr PRN  - Latuda  40 mg PO daily  - Oxycodone  10 mg PO q4 hr PRN    Deconditioning: Ambulatory status complicated by previous right AKA and left BKA,  has no insurance coverage for SNF. - refusing therapy at times. - Meclizine  25 mg PO tid PRN    History of COPD: - Albuterol  q6 hr PRN nebs   History of DVT: - Xarelto  20 mg PO daily    DVT  prophylaxis: Xarelto  Code Status:Full code Family Communication: No family at bed side Disposition Plan:   Awaiting safe disposition.  Consultants:  Psychiatry  Procedures: None  Antimicrobials:  Anti-infectives (From admission, onward)    None      Subjective: Patient was seen and examined at bedside.  Overnight events noted. Patient was sitting comfortably on the wheelchair, denies any concerns.   Objective: Vitals:   07/12/24  2250 07/13/24 0754 07/13/24 2045 07/14/24 1207  BP: (!) 126/94 123/86 131/84 118/79  Pulse: 89 69 81 69  Resp: 18 19 15 19   TempSrc:      SpO2: 99% 100% 100% 100%  Weight:      Height:        Intake/Output Summary (Last 24 hours) at 07/14/2024 1312 Last data filed at 07/14/2024 0426 Gross per 24 hour  Intake --  Output 380 ml  Net -380 ml   Filed Weights   03/26/24 0431 03/28/24 0456 04/04/24 0500  Weight: 42.7 kg 47.5 kg 61.7 kg    Examination:  General exam: Appears calm and comfortable, deconditioned, not in any acute distress. Respiratory system: Clear to auscultation. Respiratory effort normal.  RR 16 Cardiovascular system: S1 & S2 heard, RRR. No JVD, murmurs, rubs, gallops or clicks.  Gastrointestinal system: Abdomen is non distended, soft and non tender. Normal bowel sounds heard. Central nervous system: Alert and oriented x 3. No focal neurological deficits. Extremities: Right AKA, left BKA Skin: No rashes, lesions or ulcers Psychiatry: Judgement and insight appear normal. Mood & affect appropriate.     Data Reviewed: I have personally reviewed following labs and imaging studies  CBC: Recent Labs  Lab 07/13/24 0751  WBC 5.8  HGB 14.3  HCT 41.9  MCV 90.9  PLT 207   Basic Metabolic Panel: Recent Labs  Lab 07/13/24 0751  NA 138  K 4.2  CL 105  CO2 24  GLUCOSE 116*  BUN 12  CREATININE 1.12  CALCIUM  9.1  MG 1.8   GFR: Estimated Creatinine Clearance: 64.3 mL/min (by C-G formula based on SCr of 1.12 mg/dL). Liver Function Tests: Recent Labs  Lab 07/13/24 0751  AST 24  ALT 20  ALKPHOS 57  BILITOT 0.9  PROT 6.6  ALBUMIN 3.5   No results for input(s): LIPASE, AMYLASE in the last 168 hours. No results for input(s): AMMONIA in the last 168 hours. Coagulation Profile: No results for input(s): INR, PROTIME in the last 168 hours. Cardiac Enzymes: No results for input(s): CKTOTAL, CKMB, CKMBINDEX, TROPONINI in the last 168  hours. BNP (last 3 results) No results for input(s): PROBNP in the last 8760 hours. HbA1C: No results for input(s): HGBA1C in the last 72 hours. CBG: No results for input(s): GLUCAP in the last 168 hours. Lipid Profile: No results for input(s): CHOL, HDL, LDLCALC, TRIG, CHOLHDL, LDLDIRECT in the last 72 hours. Thyroid  Function Tests: No results for input(s): TSH, T4TOTAL, FREET4, T3FREE, THYROIDAB in the last 72 hours. Anemia Panel: No results for input(s): VITAMINB12, FOLATE, FERRITIN, TIBC, IRON, RETICCTPCT in the last 72 hours. Sepsis Labs: No results for input(s): PROCALCITON, LATICACIDVEN in the last 168 hours.  No results found for this or any previous visit (from the past 240 hours).   Radiology Studies: No results found.  Scheduled Meds:  carvedilol   10 mg Oral Daily   furosemide   40 mg Oral Daily   gabapentin   200 mg Oral BID   lisinopril   40 mg Oral Daily   lurasidone   40 mg Oral Q  breakfast   rivaroxaban   20 mg Oral QHS   rosuvastatin   5 mg Oral QHS   spironolactone   25 mg Oral Daily   Continuous Infusions:   LOS: 132 days    Time spent: 50 mins    Darcel Dawley, MD [facility]   If 7PM-7AM, please contact night-coverage

## 2024-07-14 NOTE — Progress Notes (Signed)
 Occupational Therapy Treatment Patient Details Name: [name] MRN: [id number] DOB: [birth year] Today's Date: 07/14/2024   History of present illness Patient is 58 y.o. male presented to ED on 03/04/24 with stroke like symptoms when caregiver found him with slurred speech and right sided weakness. Head CT reveals Acute left MCA territory infarct affecting the insula, caudate nucleus, lentiform nucleus and internal capsule. PMH significant of  Schizoaffective disorder, heart failure with reduced ejection fraction, pretension, DMII, COPD, s/p bilateral amputations, chronic pain, and polysubstance abuse.   OT comments  COTA discussed goals with OTR and patient's progress for updating goals. Patient continues to participate well with OT treatment. Patient able to doff clothing while seated in wheelchair with no assistance and was supervision for lateral scoot transfer to tub bench for safety and supervision for bathing in shower. Patient donned gown following shower.  Patient asked to perform wheelchair mobility off unit at end of session.  Discharge recommendations continue to be appropriate. Acute OT to continue to follow to address established goals to facilitate DC to next venue of care.        If plan is discharge home, recommend the following:  A little help with walking and/or transfers;A little help with bathing/dressing/bathroom;Supervision due to cognitive status;Direct supervision/assist for financial management;Direct supervision/assist for medications management   Equipment Recommendations  None recommended by OT    Recommendations for Other Services      Precautions / Restrictions Precautions Precautions: Fall Recall of Precautions/Restrictions: Impaired Precaution/Restrictions Comments: h/o BLE amputations Required Braces or Orthoses: Other Brace Other Brace: L prosthesis; and R prosthesis with Prosthetist present to chick initial socket fit Restrictions Weight Bearing  Restrictions Per Provider Order: No       Mobility Bed Mobility Overal bed mobility: Modified Independent             General bed mobility comments: OOB in wheelchair    Transfers Overall transfer level: Needs assistance Equipment used: None Transfers: Bed to chair/wheelchair/BSC            Lateral/Scoot Transfers: Supervision General transfer comment: supervision for lateral scoot transfers to/from shower chair from/to wheelchair     Balance Overall balance assessment: Needs assistance Sitting-balance support: Bilateral upper extremity supported, No upper extremity supported Sitting balance-Leahy Scale: Good                                     ADL either performed or assessed with clinical judgement   ADL Overall ADL's : Needs assistance/impaired     Grooming: Wash/dry hands;Independent;Sitting   Upper Body Bathing: Supervision/ safety;Sitting Upper Body Bathing Details (indicate cue type and reason): in shower Lower Body Bathing: Supervison/ safety;Sitting/lateral leans Lower Body Bathing Details (indicate cue type and reason): in shower Upper Body Dressing : Set up;Sitting Upper Body Dressing Details (indicate cue type and reason): donned gown             Tub/ Shower Transfer: Supervision/safety;Tub transfer;Tub bench Tub/Shower Transfer Details (indicate cue type and reason): supervision for safety   General ADL Comments: requires supervision for safety    Extremity/Trunk Assessment              Vision       Perception     Praxis     Communication Communication Communication: Impaired Factors Affecting Communication: Difficulty expressing self;Reduced clarity of speech   Cognition Arousal: Alert Behavior During Therapy: Flat affect Cognition: Cognition impaired  Awareness: Intellectual awareness intact, Online awareness impaired   Attention impairment (select first level of impairment): Selective attention                      Following commands: Intact Following commands impaired: Follows one step commands inconsistently, Follows one step commands with increased time      Cueing   Cueing Techniques: Verbal cues, Visual cues  Exercises      Shoulder Instructions       General Comments mod I for wheelchair mobility but required rest breaks    Pertinent Vitals/ Pain       Pain Assessment Pain Assessment: No/denies pain Pain Intervention(s): Monitored during session  Home Living                                          Prior Functioning/Environment              Frequency  Min 1X/week        Progress Toward Goals  OT Goals(current goals can now be found in the care plan section)  Progress towards OT goals: Progressing toward goals  Acute Rehab OT Goals Patient Stated Goal: to be independent OT Goal Formulation: With patient Time For Goal Achievement: 07/28/24 Potential to Achieve Goals: Fair ADL Goals Pt Will Perform Lower Body Bathing: with mod assist;sitting/lateral leans Pt Will Perform Lower Body Dressing: with modified independence;sitting/lateral leans;bed level Pt Will Transfer to Toilet: with modified independence;bedside commode (drop arm BSC, no cues for safety) Pt Will Perform Toileting - Clothing Manipulation and hygiene: with mod assist;sitting/lateral leans Pt Will Perform Tub/Shower Transfer: Tub transfer;tub bench;with modified independence Pt/caregiver will Perform Home Exercise Program: Right Upper extremity;Independently;With written HEP provided (to increase coordination with use of RUE) Additional ADL Goal #1: Pt will transfer to w/c with Mod I as a precursor for OOB ADLs Additional ADL Goal #2: Pt will utilize RUE to gather 5/5 objects without overshooting. Additional ADL Goal #3: Pt will demonstrate proper safety with transfers/mobility 100% of the time during session. Additional ADL Goal #4: Pt will demonstrate  appropriate safety awareness 100% of the time when positioning w/c to transfer  Plan      Co-evaluation                 AM-PAC OT 6 Clicks Daily Activity     Outcome Measure   Help from another person eating meals?: None Help from another person taking care of personal grooming?: A Little Help from another person toileting, which includes using toliet, bedpan, or urinal?: A Little Help from another person bathing (including washing, rinsing, drying)?: A Little Help from another person to put on and taking off regular upper body clothing?: A Little Help from another person to put on and taking off regular lower body clothing?: A Little 6 Click Score: 19    End of Session Equipment Utilized During Treatment: Other (comment) (wheelchair and tub bench)  OT Visit Diagnosis: Unsteadiness on feet (R26.81);Other abnormalities of gait and mobility (R26.89);Muscle weakness (generalized) (M62.81);Other symptoms and signs involving the nervous system (R29.898);Other symptoms and signs involving cognitive function;Hemiplegia and hemiparesis Hemiplegia - Right/Left: Right Hemiplegia - dominant/non-dominant: Dominant Hemiplegia - caused by: Cerebral infarction   Activity Tolerance Patient tolerated treatment well   Patient Left in chair;with call bell/phone within reach   Nurse Communication Mobility status  Time: 9068-9041 OT Time Calculation (min): 27 min  Charges: OT General Charges $OT Visit: 1 Visit OT Treatments $Self Care/Home Management : 23-37 mins  [name], OTA Acute Rehabilitation Services  Office [phone]   [name] 07/14/2024, 12:37 PM

## 2024-07-14 NOTE — Progress Notes (Signed)
 Speech Language Pathology Treatment: Cognitive-Linguistic  Patient Details Name: [name] MRN: [id number] DOB: 1966/06/28 Today's Date: 07/14/2024 Time: 8566-8546 SLP Time Calculation (min) (ACUTE ONLY): 20 min  Assessment / Plan / Recommendation Clinical Impression  Pt was trying to take a nap upon SLP arrival but was agreeable to participating in therapy. SLP reintroduced simulated pill box task, but this time with a larger number of pills and slightly more complex instructions. Pt needed Min cues for accuracy in scanning and identifying problems, but could verbally problem solve how to correct them with Mod I. As the session continued, the pt's attention waned, requiring Min-Mod cues. Note that during session, other staff came in with mail for the pt, which demonstrated pt's ability to order items online and have them mailed to him at the hospital. SLP will continue to follow.    HPI HPI: Patient is 58 y.o. male presented to ED on 03/04/24 with stroke like symptoms when caregiver found him with slurred speech and right sided weakness. Head CT reveals Acute left MCA territory infarct affecting the insula, caudate nucleus, lentiform nucleus and internal capsule. Pt initially evaluated by SLP 4/5 but s/o 4/22 due to repeated refusals. SLP reordered 5/13 for repeat cognitive-linguistic eval.   PMH significant of  Schizoaffective disorder, heart failure with reduced ejection fraction, pretension, DMII, COPD, s/p bilateral amputations, chronic pain, and polysubstance abuse.      SLP Plan  Continue with current plan of care          Recommendations                         Intermittent Supervision/Assistance (assist wtih meds/finances) Cognitive communication deficit (M58.158)     Continue with current plan of care     Leita SAILOR., M.A. CCC-SLP Acute Rehabilitation Services Office: [phone]  Secure chat preferred   07/14/2024, 3:53 PM

## 2024-07-15 DIAGNOSIS — I639 Cerebral infarction, unspecified: Secondary | ICD-10-CM | POA: Diagnosis not present

## 2024-07-15 NOTE — Progress Notes (Signed)
 On 07/14/24, [name] received a unopened package via mail containing pills. He ordered them himself and allowed nursing leadership to secure them with his belonging per policy. This item will be returned to [name] upon discharge from our facility. This item is valued at approximately $120 per patient.

## 2024-07-15 NOTE — Plan of Care (Signed)
   Problem: Coping: Goal: Level of anxiety will decrease Outcome: Progressing   Problem: Elimination: Goal: Will not experience complications related to bowel motility Outcome: Progressing Goal: Will not experience complications related to urinary retention Outcome: Progressing   Problem: Pain Managment: Goal: General experience of comfort will improve and/or be controlled Outcome: Progressing   Problem: Safety: Goal: Ability to remain free from injury will improve Outcome: Progressing   Problem: Skin Integrity: Goal: Risk for impaired skin integrity will decrease Outcome: Progressing

## 2024-07-15 NOTE — TOC Progression Note (Signed)
 Transition of Care [facility]) - Progression Note    Patient Details  Name: [name] MRN: [id number] Date of Birth: 24-Apr-1966  [facility] CM/SW Contact  [name], [name] Phone Number: 07/15/2024, 3:47 PM  Clinical Narrative:   CSW received contact from [name] asking for brief update on patient. Update provided. Continuing to await court hearing. CSW to follow.    Expected Discharge Plan: Skilled Nursing Facility Barriers to Discharge: English as a second language teacher, Active Substance Use - Placement, SNF Pending bed offer               Expected Discharge Plan and Services       Living arrangements for the past 2 months: Apartment Expected Discharge Date: 03/14/24                                     Social Drivers of Health (SDOH) Interventions SDOH Screenings   Food Insecurity: Food Insecurity Present (03/06/2024)  Housing: High Risk (03/06/2024)  Transportation Needs: No Transportation Needs (03/06/2024)  Utilities: Patient Declined (03/06/2024)  Alcohol Screen: Medium Risk (06/24/2019)  Depression (PHQ2-9): Low Risk  (02/18/2024)  Recent Concern: Depression (PHQ2-9) - Medium Risk (01/21/2024)  Financial Resource Strain: High Risk (07/24/2021)   Received from [facility]  Physical Activity: Inactive (02/18/2024)  Social Connections: Unknown (04/05/2022)   [facility]  Stress: Stress Concern Present (07/24/2021)   Received from [facility]  Tobacco Use: High Risk (03/04/2024)  Health Literacy: Adequate Health Literacy (02/18/2024)    Readmission Risk Interventions    12/21/2023    9:25 AM 12/09/2022    9:02 AM 12/01/2022    3:56 PM  Readmission Risk Prevention Plan  Transportation Screening Complete Complete Complete  HRI or Home Care Consult Complete    Social Work Consult for Recovery Care Planning/Counseling Complete    Palliative Care Screening Not Applicable    Medication Review Oceanographer) Complete Complete Complete   PCP or Specialist appointment within 3-5 days of discharge  Complete Complete  HRI or Home Care Consult  Complete Complete  SW Recovery Care/Counseling Consult  Complete Complete  Palliative Care Screening  Not Applicable Not Applicable  Skilled Nursing Facility  Not Applicable Not Applicable  SNF Comments   n/a

## 2024-07-15 NOTE — Plan of Care (Signed)
   Problem: Nutrition: Goal: Adequate nutrition will be maintained Outcome: Progressing   Problem: Coping: Goal: Level of anxiety will decrease Outcome: Progressing   Problem: Elimination: Goal: Will not experience complications related to bowel motility Outcome: Progressing

## 2024-07-15 NOTE — Progress Notes (Signed)
 PROGRESS NOTE    [name]  FMW:[id number] DOB: Mar 24, 1966 DOA: 03/04/2024 PCP: Tanda Bleacher, MD  Brief Narrative:  This 58 year old male with medical history significant for COPD, cocaine abuse, presented to the ED 03/04/24 for slower speech, confusion and right-sided weakness, CT head showed left MCA infarct, CTA head and neck revealed an M2 occlusion on the left,   Echocardiogram revealed large mobile left ventricular thrombus on the left and right ventricular apical thrombus in the right, ejection fraction less than 20%.  Cardiology was consulted recommended DOAC.  PT evaluated patient and recommended skilled nursing facility placement.  Patient was found to lack medical decision capacity/status and not amenable to determining his discharge disposition or leave AGAINST MEDICAL ADVICE.   TOC is pursuing guardianship with APS as he is lacking medical decision capacity. He did complain of halluacinations, eating poor, refused medications, has insomnia  Assessment & Plan:   Principal Problem:   Acute CVA (cerebrovascular accident) (HCC) Active Problems:   LV (left ventricular) mural thrombus   Chronic systolic CHF (congestive heart failure) (HCC)   Impaired decision making   Prolonged QT interval   Elevated liver enzymes   COPD (chronic obstructive pulmonary disease) (HCC)   Cocaine abuse (HCC)   Schizoaffective disorder, bipolar type (HCC)   Benign essential HTN   Acid reflux   History of DVT (deep vein thrombosis)   Type 2 diabetes mellitus (HCC)   Status post bilateral BKA.   Dilated cardiomyopathy (HCC)  Acute CVA (cerebrovascular accident) [facility]) He underwent extensive workup and imaging revealed left MCA territory infarct, caudate nucleus, lentiform nucleus and internal capsule confirmed on MRI. TEE was noted for large mobile left lower clot just under the mitral valve with an EF of 20% and a small right apical clot with reduced RV systolic function. Psychiatry has deemed  the patient to lack capacity; he continues to remain noncompliant at times. Unable to force meds.  Continue offering throughout the day as able.   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 Psych recommending APS guardianship, TOC in touch with APS regarding this as patient lacks medical capacity.   Essential hypertension:  - Coreg  CR 24 hr 10 mg PO daily - Lisinopril  40 mg PO daily   - Aldactone  25 mg PO daily  - Hydralazine  25 mg PO q6 hr PRN  - IV labetalol  20 mg q3 hr PRN    Hyperlipidemia: Continue Crestor  5 mg PO at bedtime.   Diabetes mellitus type 2: - last A1c of 5.4. refuses monitoring  - Ensure enlive PO bid  - Gabapentin  200 mg PO bid    Polysubstance abuse: - Ativan  1 mg PO q6 hr PRN  - Latuda  40 mg PO daily  - Oxycodone  10 mg PO q4 hr PRN    Deconditioning: Ambulatory status complicated by previous right AKA and left BKA,  has no insurance coverage for SNF. - refusing therapy at times. - Meclizine  25 mg PO tid PRN    History of COPD: - Albuterol  q6 hr PRN nebs   History of DVT: - Xarelto  20 mg PO daily    DVT  prophylaxis: Xarelto  Code Status:Full code Family Communication: No family at bed side Disposition Plan:   Awaiting safe disposition.  Consultants:  Psychiatry  Procedures: None  Antimicrobials:  Anti-infectives (From admission, onward)    None      Subjective: Patient was seen and examined at bedside.  Overnight events noted. Patient was sitting comfortably on the wheelchair, denies any concerns. RN denies any other  concerns.   Objective: Vitals:   07/13/24 2045 07/14/24 1207 07/14/24 1933 07/15/24 1211  BP: 131/84 118/79 (!) 156/88 116/83  Pulse: 81 69 76 89  Resp: 15 19  19   TempSrc:      SpO2: 100% 100% 100% 98%  Weight:      Height:       No intake or output data in the 24 hours ending 07/15/24 1312  Filed Weights   03/26/24 0431 03/28/24 0456 04/04/24 0500  Weight: 42.7 kg 47.5 kg 61.7 kg    Examination:  General exam: Appears calm and comfortable, deconditioned, not in any acute distress. Respiratory system: CTA Bilaterally. Respiratory effort normal.  RR 16 Cardiovascular system: S1 & S2 heard, RRR. No JVD, murmurs, rubs, gallops or clicks.  Gastrointestinal system: Abdomen is non distended, soft and non tender. Normal bowel sounds heard. Central nervous system: Alert and oriented x 3. No focal neurological deficits. Extremities: Right AKA, left BKA Skin: No rashes, lesions or ulcers Psychiatry: Judgement and insight appear normal. Mood & affect appropriate.     Data Reviewed: I have personally reviewed following labs and imaging studies  CBC: Recent Labs  Lab 07/13/24 0751  WBC 5.8  HGB 14.3  HCT 41.9  MCV 90.9  PLT 207   Basic Metabolic Panel: Recent Labs  Lab 07/13/24 0751  NA 138  K 4.2  CL 105  CO2 24  GLUCOSE 116*  BUN 12  CREATININE 1.12  CALCIUM  9.1  MG 1.8   GFR: Estimated Creatinine Clearance: 64.3 mL/min (by C-G formula based on SCr of 1.12 mg/dL). Liver Function Tests: Recent Labs  Lab 07/13/24 0751  AST 24  ALT 20  ALKPHOS 57  BILITOT 0.9  PROT 6.6  ALBUMIN 3.5   No results for input(s): LIPASE, AMYLASE in the last 168 hours. No results for input(s): AMMONIA in the last 168 hours. Coagulation Profile: No results for input(s): INR, PROTIME in the last 168 hours. Cardiac Enzymes: No results for input(s): CKTOTAL, CKMB, CKMBINDEX, TROPONINI in the last 168 hours. BNP (last 3 results) No results for input(s):  PROBNP in the last 8760 hours. HbA1C: No results for input(s): HGBA1C in the last 72 hours. CBG: No results for input(s): GLUCAP in the last 168 hours. Lipid Profile: No results for input(s): CHOL, HDL, LDLCALC, TRIG, CHOLHDL, LDLDIRECT in the last 72 hours. Thyroid  Function Tests: No results for input(s): TSH, T4TOTAL, FREET4, T3FREE, THYROIDAB in the last 72 hours. Anemia Panel: No results for input(s): VITAMINB12, FOLATE, FERRITIN, TIBC, IRON, RETICCTPCT in the last 72 hours. Sepsis Labs: No results for input(s): PROCALCITON, LATICACIDVEN in the last 168 hours.  No results found for this or any previous visit (from the past 240 hours).   Radiology Studies: No results found.  Scheduled Meds:  carvedilol   10 mg Oral Daily   furosemide   40 mg Oral Daily   gabapentin   200 mg Oral BID   lisinopril   40 mg Oral Daily   lurasidone   40 mg Oral Q breakfast   rivaroxaban   20 mg Oral QHS   rosuvastatin   5 mg  Oral QHS   spironolactone   25 mg Oral Daily   Continuous Infusions:   LOS: 133 days    Time spent: 35 mins    Darcel Dawley, MD [facility]   If 7PM-7AM, please contact night-coverage

## 2024-07-16 DIAGNOSIS — I639 Cerebral infarction, unspecified: Secondary | ICD-10-CM | POA: Diagnosis not present

## 2024-07-16 NOTE — Progress Notes (Signed)
 PROGRESS NOTE    [name]  FMW:[id number] DOB: [birth year] DOA: 03/04/2024 PCP: Tanda Bleacher, MD  Brief Narrative:  This 58 year old male with medical history significant for COPD, cocaine abuse, presented to the ED 03/04/24 for slower speech, confusion and right-sided weakness, CT head showed left MCA infarct, CTA head and neck revealed an M2 occlusion on the left,   Echocardiogram revealed large mobile left ventricular thrombus on the left and right ventricular apical thrombus in the right, ejection fraction less than 20%.  Cardiology was consulted recommended DOAC.  PT evaluated patient and recommended skilled nursing facility placement.  Patient was found to lack medical decision capacity/status and not amenable to determining his discharge disposition or leave AGAINST MEDICAL ADVICE.   TOC is pursuing guardianship with APS as he is lacking medical decision capacity. He did complain of halluacinations, eating poor, refused medications, has insomnia  Assessment & Plan:   Principal Problem:   Acute CVA (cerebrovascular accident) (HCC) Active Problems:   LV (left ventricular) mural thrombus   Chronic systolic CHF (congestive heart failure) (HCC)   Impaired decision making   Prolonged QT interval   Elevated liver enzymes   COPD (chronic obstructive pulmonary disease) (HCC)   Cocaine abuse (HCC)   Schizoaffective disorder, bipolar type (HCC)   Benign essential HTN   Acid reflux   History of DVT (deep vein thrombosis)   Type 2 diabetes mellitus (HCC)   Status post bilateral BKA.   Dilated cardiomyopathy (HCC)  Acute CVA (cerebrovascular accident) [facility]) He underwent extensive workup and imaging revealed left MCA territory infarct, caudate nucleus, lentiform nucleus and internal capsule confirmed on MRI. TEE was noted for large mobile left lower clot just under the mitral valve with an EF of 20% and a small right apical clot with reduced RV systolic function. Psychiatry has deemed  the patient to lack capacity; he continues to remain noncompliant at times. Unable to force meds.  Continue offering throughout the day as able.   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 Psych recommending APS guardianship, TOC in touch with APS regarding this as patient lacks medical capacity.   Essential hypertension:  - Coreg  CR 24 hr 10 mg PO daily - Lisinopril  40 mg PO daily   - Aldactone  25 mg PO daily  - Hydralazine  25 mg PO q6 hr PRN  - IV labetalol  20 mg q3 hr PRN    Hyperlipidemia: Continue Crestor  5 mg PO at bedtime.   Diabetes mellitus type 2: - last A1c of 5.4. refuses monitoring  - Ensure enlive PO bid  - Gabapentin  200 mg PO bid    Polysubstance abuse: - Ativan  1 mg PO q6 hr PRN  - Latuda  40 mg PO daily  - Oxycodone  10 mg PO q4 hr PRN    Deconditioning: Ambulatory status complicated by previous right AKA and left BKA,  has no insurance coverage for SNF. - refusing therapy at times. - Meclizine  25 mg PO tid PRN    History of COPD: - Albuterol  q6 hr PRN nebs   History of DVT: - Xarelto  20 mg PO daily    DVT  prophylaxis: Xarelto  Code Status:Full code Family Communication: No family at bed side Disposition Plan:   Awaiting safe disposition.  Consultants:  Psychiatry  Procedures: None  Antimicrobials:  Anti-infectives (From admission, onward)    None      Subjective: Patient was seen and examined at bedside.  Overnight events noted. Patient was sitting comfortably on the wheelchair, denies any concerns. RN denies any other  concerns.   Objective: Vitals:   07/15/24 1211 07/15/24 1635 07/15/24 2021 07/16/24 0745  BP: 116/83 114/81 115/82 137/86  Pulse: 89 85 84 67  Resp: 19 18  18   TempSrc:      SpO2: 98% 100% 99% 100%  Weight:      Height:        Intake/Output Summary (Last 24 hours) at 07/16/2024 1259 Last data filed at 07/16/2024 0748 Gross per 24 hour  Intake --  Output 400 ml  Net -400 ml    Filed Weights   03/26/24 0431 03/28/24 0456 04/04/24 0500  Weight: 42.7 kg 47.5 kg 61.7 kg    Examination:  General exam: Appears calm and comfortable, deconditioned, not in any acute distress. Respiratory system: CTA Bilaterally. Respiratory effort normal.  RR 16 Cardiovascular system: S1 & S2 heard, RRR. No JVD, murmurs, rubs, gallops or clicks.  Gastrointestinal system: Abdomen is non distended, soft and non tender. Normal bowel sounds heard. Central nervous system: Alert and oriented x 3. No focal neurological deficits. Extremities: Right AKA, left BKA Skin: No rashes, lesions or ulcers Psychiatry: Judgement and insight appear normal. Mood & affect appropriate.     Data Reviewed: I have personally reviewed following labs and imaging studies  CBC: Recent Labs  Lab 07/13/24 0751  WBC 5.8  HGB 14.3  HCT 41.9  MCV 90.9  PLT 207   Basic Metabolic Panel: Recent Labs  Lab 07/13/24 0751  NA 138  K 4.2  CL 105  CO2 24  GLUCOSE 116*  BUN 12  CREATININE 1.12  CALCIUM  9.1  MG 1.8   GFR: Estimated Creatinine Clearance: 64.3 mL/min (by C-G formula based on SCr of 1.12 mg/dL). Liver Function Tests: Recent Labs  Lab 07/13/24 0751  AST 24  ALT 20  ALKPHOS 57  BILITOT 0.9  PROT 6.6  ALBUMIN 3.5   No results for input(s): LIPASE, AMYLASE in the last 168 hours. No results for input(s): AMMONIA in the last 168 hours. Coagulation Profile: No results for input(s): INR, PROTIME in the last 168 hours. Cardiac Enzymes: No results for input(s): CKTOTAL, CKMB, CKMBINDEX,  TROPONINI in the last 168 hours. BNP (last 3 results) No results for input(s): PROBNP in the last 8760 hours. HbA1C: No results for input(s): HGBA1C in the last 72 hours. CBG: No results for input(s): GLUCAP in the last 168 hours. Lipid Profile: No results for input(s): CHOL, HDL, LDLCALC, TRIG, CHOLHDL, LDLDIRECT in the last 72 hours. Thyroid  Function Tests: No results for input(s): TSH, T4TOTAL, FREET4, T3FREE, THYROIDAB in the last 72 hours. Anemia Panel: No results for input(s): VITAMINB12, FOLATE, FERRITIN, TIBC, IRON, RETICCTPCT in the last 72 hours. Sepsis Labs: No results for input(s): PROCALCITON, LATICACIDVEN in the last 168 hours.  No results found for this or any previous visit (from the past 240 hours).   Radiology Studies: No results found.  Scheduled Meds:  carvedilol   10 mg Oral Daily   furosemide   40 mg Oral Daily   gabapentin   200 mg Oral BID   lisinopril   40 mg Oral Daily   lurasidone   40 mg Oral Q breakfast   rivaroxaban   20 mg Oral QHS   rosuvastatin   5 mg Oral QHS   spironolactone   25 mg Oral Daily   Continuous Infusions:   LOS: 134 days    Time spent: 35 mins    Darcel Dawley, MD [facility]   If 7PM-7AM, please contact night-coverage

## 2024-07-17 DIAGNOSIS — I639 Cerebral infarction, unspecified: Secondary | ICD-10-CM | POA: Diagnosis not present

## 2024-07-17 NOTE — Progress Notes (Signed)
 PROGRESS NOTE    [name]  FMW:[id number] DOB: Jun 01, 1966 DOA: 03/04/2024 PCP: Tanda Bleacher, MD  Brief Narrative:  This 58 year old male with medical history significant for COPD, cocaine abuse, presented to the ED 03/04/24 for slower speech, confusion and right-sided weakness, CT head showed left MCA infarct, CTA head and neck revealed an M2 occlusion on the left,   Echocardiogram revealed large mobile left ventricular thrombus on the left and right ventricular apical thrombus in the right, ejection fraction less than 20%.  Cardiology was consulted recommended DOAC.  PT evaluated patient and recommended skilled nursing facility placement.  Patient was found to lack medical decision capacity/status and not amenable to determining his discharge disposition or leave AGAINST MEDICAL ADVICE.   TOC is pursuing guardianship with APS as he is lacking medical decision capacity. He did complain of halluacinations, eating poor, refused medications, has insomnia  Assessment & Plan:   Principal Problem:   Acute CVA (cerebrovascular accident) (HCC) Active Problems:   LV (left ventricular) mural thrombus   Chronic systolic CHF (congestive heart failure) (HCC)   Impaired decision making   Prolonged QT interval   Elevated liver enzymes   COPD (chronic obstructive pulmonary disease) (HCC)   Cocaine abuse (HCC)   Schizoaffective disorder, bipolar type (HCC)   Benign essential HTN   Acid reflux   History of DVT (deep vein thrombosis)   Type 2 diabetes mellitus (HCC)   Status post bilateral BKA.   Dilated cardiomyopathy (HCC)  Acute CVA (cerebrovascular accident) [facility]): He underwent extensive workup and imaging revealed left MCA territory infarct, caudate nucleus, lentiform nucleus and internal capsule confirmed on MRI. TEE was noted for large mobile left lower clot just under the mitral valve with an EF of 20% and a small right apical clot with reduced RV systolic function. Psychiatry has deemed  the patient to lack capacity; he continues to remain noncompliant at times. Unable to force meds.  Continue offering throughout the day as able.   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 Last 2D echo showed an EF of less than 20% Continue Xarelto  20 mg PO daily    Schizoaffective disorder / bipolar disorder / lacks medical capacity: Psych was consulted and recommended that the patient lacks medical capacity to make decisions if the patient attempts to leave AGAINST MEDICAL ADVICE he should be involuntarily committed. Psych recommending APS guardianship, TOC in touch with APS regarding this as patient lacks medical capacity.   Essential hypertension:  - Coreg  CR 24 hr 10 mg PO daily - Lisinopril  40 mg PO daily   - Aldactone  25 mg PO daily  - Hydralazine  25 mg PO q6 hr PRN  - IV labetalol  20 mg q3 hr PRN    Hyperlipidemia: Continue Crestor  5 mg PO at bedtime.   Diabetes mellitus type 2: - last A1c of 5.4. refuses monitoring  - Ensure enlive PO bid  - Gabapentin  200 mg PO bid    Polysubstance abuse: - Ativan  1 mg PO q6 hr PRN  - Latuda  40 mg PO daily  - Oxycodone  10 mg PO q4 hr PRN    Deconditioning: Ambulatory status complicated by previous right AKA and left BKA,  has no insurance coverage for SNF. - refusing therapy at times. - Meclizine  25 mg PO tid PRN    History of COPD: - Albuterol  q6 hr PRN nebs   History of DVT: - Xarelto  20 mg PO daily    DVT prophylaxis:  Xarelto  Code Status:Full code Family Communication: No family at bed side Disposition Plan:   Awaiting safe disposition.  Consultants:  Psychiatry  Procedures: None  Antimicrobials:  Anti-infectives (From admission, onward)    None      Subjective: Patient was seen and examined at bedside.  Overnight events noted. Patient was sitting comfortably on the wheelchair, denies any concerns.    Objective: Vitals:   07/15/24  2021 07/16/24 0745 07/16/24 1644 07/17/24 0800  BP:  137/86 121/78 130/85  Pulse: 84 67 75 71  Resp:  18 18 18   Temp:   98.4 F (36.9 C)   TempSrc:   Oral   SpO2: 99% 100% 100% 100%  Weight:      Height:        Intake/Output Summary (Last 24 hours) at 07/17/2024 1244 Last data filed at 07/17/2024 0400 Gross per 24 hour  Intake --  Output 250 ml  Net -250 ml    Filed Weights   03/26/24 0431 03/28/24 0456 04/04/24 0500  Weight: 42.7 kg 47.5 kg 61.7 kg    Examination:  General exam: Appears calm and comfortable, deconditioned, not in any acute distress. Respiratory system: CTA Bilaterally. Respiratory effort normal.  RR 16 Cardiovascular system: S1 & S2 heard, RRR. No JVD, murmurs, rubs, gallops or clicks.  Gastrointestinal system: Abdomen is non distended, soft and non tender. Normal bowel sounds heard. Central nervous system: Alert and oriented x 3. No focal neurological deficits. Extremities: Right AKA, left BKA Skin: No rashes, lesions or ulcers Psychiatry: Judgement and insight appear normal. Mood & affect appropriate.     Data Reviewed: I have personally reviewed following labs and imaging studies  CBC: Recent Labs  Lab 07/13/24 0751  WBC 5.8  HGB 14.3  HCT 41.9  MCV 90.9  PLT 207   Basic Metabolic Panel: Recent Labs  Lab 07/13/24 0751  NA 138  K 4.2  CL 105  CO2 24  GLUCOSE 116*  BUN 12  CREATININE 1.12  CALCIUM  9.1  MG 1.8   GFR: Estimated Creatinine Clearance: 64.3 mL/min (by C-G formula based on SCr of 1.12 mg/dL). Liver Function Tests: Recent Labs  Lab 07/13/24 0751  AST 24  ALT 20  ALKPHOS 57  BILITOT 0.9  PROT 6.6  ALBUMIN 3.5   No results for input(s): LIPASE, AMYLASE in the last 168 hours. No results for input(s): AMMONIA in the last 168 hours. Coagulation Profile: No results for input(s): INR, PROTIME in the last 168 hours. Cardiac Enzymes: No results for input(s): CKTOTAL, CKMB, CKMBINDEX, TROPONINI in the  last 168 hours. BNP (last 3 results) No results for input(s): PROBNP in the last 8760 hours. HbA1C: No results for input(s): HGBA1C in the last 72 hours. CBG: No results for input(s): GLUCAP in the last 168 hours. Lipid Profile: No results for input(s): CHOL, HDL, LDLCALC, TRIG, CHOLHDL, LDLDIRECT in the last 72 hours. Thyroid  Function Tests: No results for input(s): TSH, T4TOTAL, FREET4, T3FREE, THYROIDAB in the last 72 hours. Anemia Panel: No results for input(s): VITAMINB12, FOLATE, FERRITIN, TIBC, IRON, RETICCTPCT in the last 72 hours. Sepsis Labs: No results for input(s): PROCALCITON, LATICACIDVEN in the last 168 hours.  No results found for this or any previous visit (from the past 240 hours).   Radiology Studies: No results found.  Scheduled Meds:  carvedilol   10 mg Oral Daily   furosemide   40 mg Oral Daily   gabapentin   200 mg Oral BID   lisinopril   40 mg Oral Daily  lurasidone   40 mg Oral Q breakfast   rivaroxaban   20 mg Oral QHS   rosuvastatin   5 mg Oral QHS   spironolactone   25 mg Oral Daily   Continuous Infusions:   LOS: 135 days    Time spent: 35 mins    Darcel Dawley, MD [facility]   If 7PM-7AM, please contact night-coverage

## 2024-07-18 DIAGNOSIS — I639 Cerebral infarction, unspecified: Secondary | ICD-10-CM | POA: Diagnosis not present

## 2024-07-18 MED ORDER — OXYCODONE HCL 5 MG PO TABS
10.0000 mg | ORAL_TABLET | ORAL | Status: DC | PRN
  Administered 2024-07-18 – 2024-08-17 (×79): 10 mg via ORAL
  Filled 2024-07-18 (×81): qty 2

## 2024-07-18 NOTE — Progress Notes (Signed)
 Physical Therapy Treatment Patient Details Name: [name] MRN: [id number] DOB: [birth year] Today's Date: 07/18/2024   History of Present Illness Patient is 58 y.o. male presented to ED on 03/04/24 with stroke like symptoms when caregiver found him with slurred speech and right sided weakness. Head CT reveals Acute left MCA territory infarct affecting the insula, caudate nucleus, lentiform nucleus and internal capsule. PMH significant of  Schizoaffective disorder, heart failure with reduced ejection fraction, pretension, DMII, COPD, s/p bilateral amputations, chronic pain, and polysubstance abuse.    PT Comments  Continuing work on functional mobility and activity tolerance;  Session focused on functional transfers in the setting of prosthesis socket fit check; Noteworthy that [name] was dressed and ready to meet with prosthetist, even as he was feeling tired; He was able to stand within parallel bars longer today; will continue efforts; another fit check is schedule for Monday 8/25 at noon    If plan is discharge home, recommend the following: Supervision due to cognitive status;Direct supervision/assist for financial management;Direct supervision/assist for medications management;Assist for transportation   Can travel by private vehicle      (soon)  Equipment Recommendations  None recommended by PT    Recommendations for Other Services Speech consult;OT consult (Prosthetist)     Precautions / Restrictions Precautions Precautions: Fall Recall of Precautions/Restrictions: Impaired Precaution/Restrictions Comments: h/o BLE amputations Required Braces or Orthoses: Other Brace Other Brace: L prosthesis; and R prosthesis with Prosthetist present to chick initial socket fit Restrictions Weight Bearing Restrictions Per Provider Order: No     Mobility  Bed Mobility               General bed mobility comments: OOB in wheelchair; dressed for prosthesis training     Transfers Overall transfer level: Needs assistance Equipment used:  (Parallel bars in AIR gym) Transfers: Sit to/from Stand Sit to Stand: Contact guard assist, Min assist, Mod assist, +2 safety/equipment           General transfer comment: 3 standing bouts with up to a minute each time; Close guard for safety standing to L prosthesis and R initial prosthesis; Prosthetist present and stabilizing R prosthesis (with mechanical knee) during transitions; min/mod assist to control descent to sit; difficulty with triggering R knee to bend second bout of standing    Ambulation/Gait                   Psychologist, counselling propulsion: Both upper extremities Distance: Rehab Gym to [employer] and tehn to room Wheelchair Assistance Details (indicate cue type and reason): Noteworthy because pt had his L prosthesis on, and held the foot up off the floor the entire time   Tilt Bed    Modified Rankin (Stroke Patients Only) Modified Rankin (Stroke Patients Only) Pre-Morbid Rankin Score: Slight disability Modified Rankin: Moderate disability     Balance Overall balance assessment: Mild deficits observed, not formally tested Sitting-balance support: Bilateral upper extremity supported Sitting balance-Leahy Scale: Good       Standing balance-Leahy Scale: Poor Standing balance comment: Heavy reliance on UE support; but able to stand within parallel bars for 60 seconds each standing bout                            Communication Communication Factors Affecting Communication: Difficulty expressing self;Reduced clarity of speech  Cognition Arousal: Alert Behavior During Therapy: Flat  affect                             Following commands: Intact Following commands impaired: Follows one step commands inconsistently, Follows one step commands with increased time    Cueing Cueing Techniques: Verbal  cues, Visual cues  Exercises      General Comments General comments (skin integrity, edema, etc.): Noting R brake on wheelchair seems a bit warped and slightly more difficult to engage      Pertinent Vitals/Pain Pain Assessment Pain Assessment: No/denies pain Pain Intervention(s): Monitored during session    Home Living                          Prior Function            PT Goals (current goals can now be found in the care plan section) Acute Rehab PT Goals Patient Stated Goal: Agreeable to working on prosthesis training PT Goal Formulation: With patient Time For Goal Achievement: 07/12/24 Potential to Achieve Goals: Fair Progress towards PT goals: Progressing toward goals    Frequency    Min 1X/week      PT Plan      Co-evaluation              AM-PAC PT 6 Clicks Mobility   Outcome Measure  Help needed turning from your back to your side while in a flat bed without using bedrails?: None Help needed moving from lying on your back to sitting on the side of a flat bed without using bedrails?: None Help needed moving to and from a bed to a chair (including a wheelchair)?: A Little Help needed standing up from a chair using your arms (e.g., wheelchair or bedside chair)?: A Lot Help needed to walk in hospital room?: Total Help needed climbing 3-5 steps with a railing? : Total 6 Click Score: 15    End of Session   Activity Tolerance: Patient tolerated treatment well Patient left: Other (comment) (rolling in hallway) Nurse Communication: Mobility status PT Visit Diagnosis: Other abnormalities of gait and mobility (R26.89);Muscle weakness (generalized) (M62.81);Other symptoms and signs involving the nervous system (R29.898);Hemiplegia and hemiparesis Hemiplegia - Right/Left: Right Hemiplegia - dominant/non-dominant: Dominant Hemiplegia - caused by: Cerebral infarction     Time: 1215-1245 PT Time Calculation (min) (ACUTE ONLY): 30 min  Charges:     $Therapeutic Activity: 8-22 mins $Orthotics/Prosthetics Check: 8-22 mins PT General Charges $$ ACUTE PT VISIT: 1 Visit                     [name], PT  Acute Rehabilitation Services Office [phone] Secure Chat welcomed    [name] 07/18/2024, 2:59 PM

## 2024-07-18 NOTE — Progress Notes (Signed)
 PROGRESS NOTE    [name]  FMW:[id number] DOB: March 06, 1966 DOA: 03/04/2024 PCP: Tanda Bleacher, MD  Brief Narrative:  This 58 year old male with medical history significant for COPD, cocaine abuse, presented to the ED 03/04/24 for slower speech, confusion and right-sided weakness, CT head showed left MCA infarct, CTA head and neck revealed an M2 occlusion on the left,   Echocardiogram revealed large mobile left ventricular thrombus on the left and right ventricular apical thrombus in the right, ejection fraction less than 20%.  Cardiology was consulted recommended DOAC.  PT evaluated patient and recommended skilled nursing facility placement.  Patient was found to lack medical decision capacity/status and not amenable to determining his discharge disposition or leave AGAINST MEDICAL ADVICE.   TOC is pursuing guardianship with APS as he is lacking medical decision capacity. He did complain of halluacinations, eating poor, refused medications, has insomnia  Assessment & Plan:   Principal Problem:   Acute CVA (cerebrovascular accident) (HCC) Active Problems:   LV (left ventricular) mural thrombus   Chronic systolic CHF (congestive heart failure) (HCC)   Impaired decision making   Prolonged QT interval   Elevated liver enzymes   COPD (chronic obstructive pulmonary disease) (HCC)   Cocaine abuse (HCC)   Schizoaffective disorder, bipolar type (HCC)   Benign essential HTN   Acid reflux   History of DVT (deep vein thrombosis)   Type 2 diabetes mellitus (HCC)   Status post bilateral BKA.   Dilated cardiomyopathy (HCC)  Acute CVA (cerebrovascular accident) [facility]): He underwent extensive workup and imaging revealed left MCA territory infarct, caudate nucleus, lentiform nucleus and internal capsule confirmed on MRI. TEE was noted for large mobile left lower clot just under the mitral valve with an EF of 20% and a small right apical clot with reduced RV systolic function. Psychiatry has deemed  the patient to lack capacity; he continues to remain noncompliant at times. Unable to force meds.  Continue offering throughout the day as able.   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 Last 2D echo showed an EF of less than 20% Continue Xarelto  20 mg PO daily    Schizoaffective disorder / bipolar disorder / lacks medical capacity: Psych was consulted and recommended that the patient lacks medical capacity to make decisions if the patient attempts to leave AGAINST MEDICAL ADVICE he should be involuntarily committed. Psych recommending APS guardianship, TOC in touch with APS regarding this as patient lacks medical capacity.   Essential hypertension:  - Coreg  CR 24 hr 10 mg PO daily - Lisinopril  40 mg PO daily   - Aldactone  25 mg PO daily  - Hydralazine  25 mg PO q6 hr PRN  - IV labetalol  20 mg q3 hr PRN    Hyperlipidemia: Continue Crestor  5 mg PO at bedtime.   Diabetes mellitus type 2: - last A1c of 5.4. refuses monitoring  - Ensure enlive PO bid  - Gabapentin  200 mg PO bid    Polysubstance abuse: - Ativan  1 mg PO q6 hr PRN  - Latuda  40 mg PO daily  - Oxycodone  10 mg PO q4 hr PRN    Deconditioning: Ambulatory status complicated by previous right AKA and left BKA,  has no insurance coverage for SNF. - refusing therapy at times. - Meclizine  25 mg PO tid PRN    History of COPD: - Albuterol  q6 hr PRN nebs   History of DVT: - Xarelto  20 mg PO daily    DVT prophylaxis:  Xarelto  Code Status:Full code Family Communication: No family at bed side. Disposition Plan:   Awaiting safe disposition.  Consultants:  Psychiatry  Procedures: None  Antimicrobials:  Anti-infectives (From admission, onward)    None      Subjective: Patient was seen and examined at bedside.  Overnight events noted. Patient was sitting comfortably on the wheelchair, denies any concerns. Patient asked to increase his pain  medication, states his pain is not reasonably controlled.    Objective: Vitals:   07/16/24 0745 07/16/24 1644 07/17/24 0800 07/18/24 1156  BP:   130/85 111/76  Pulse: 67 75 71 82  Resp: 18 18 18 18   TempSrc:  Oral    SpO2: 100% 100% 100% (!) 85%  Weight:      Height:       No intake or output data in the 24 hours ending 07/18/24 1158   Filed Weights   03/26/24 0431 03/28/24 0456 04/04/24 0500  Weight: 42.7 kg 47.5 kg 61.7 kg    Examination:  General exam: Appears calm and comfortable, deconditioned, not in any acute distress. Respiratory system: CTA Bilaterally. Respiratory effort normal.  RR 16 Cardiovascular system: S1 & S2 heard, RRR. No JVD, murmurs, rubs, gallops or clicks.  Gastrointestinal system: Abdomen is non distended, soft and non tender. Normal bowel sounds heard. Central nervous system: Alert and oriented x 3. No focal neurological deficits. Extremities: Right AKA, left BKA Skin: No rashes, lesions or ulcers Psychiatry: Judgement and insight appear normal. Mood & affect appropriate.     Data Reviewed: I have personally reviewed following labs and imaging studies  CBC: Recent Labs  Lab 07/13/24 0751  WBC 5.8  HGB 14.3  HCT 41.9  MCV 90.9  PLT 207   Basic Metabolic Panel: Recent Labs  Lab 07/13/24 0751  NA 138  K 4.2  CL 105  CO2 24  GLUCOSE 116*  BUN 12  CREATININE 1.12  CALCIUM  9.1  MG 1.8   GFR: Estimated Creatinine Clearance: 64.3 mL/min (by C-G formula based on SCr of 1.12 mg/dL). Liver Function Tests: Recent Labs  Lab 07/13/24 0751  AST 24  ALT 20  ALKPHOS 57  BILITOT 0.9  PROT 6.6  ALBUMIN 3.5   No results for input(s): LIPASE, AMYLASE in the last 168 hours. No results for input(s): AMMONIA in the last 168 hours. Coagulation Profile: No results for input(s): INR, PROTIME in the last 168 hours. Cardiac Enzymes: No results for input(s): CKTOTAL, CKMB, CKMBINDEX, TROPONINI in the last 168 hours. BNP  (last 3 results) No results for input(s): PROBNP in the last 8760 hours. HbA1C: No results for input(s): HGBA1C in the last 72 hours. CBG: No results for input(s): GLUCAP in the last 168 hours. Lipid Profile: No results for input(s): CHOL, HDL, LDLCALC, TRIG, CHOLHDL, LDLDIRECT in the last 72 hours. Thyroid  Function Tests: No results for input(s): TSH, T4TOTAL, FREET4, T3FREE, THYROIDAB in the last 72 hours. Anemia Panel: No results for input(s): VITAMINB12, FOLATE, FERRITIN, TIBC, IRON, RETICCTPCT in the last 72 hours. Sepsis Labs: No results for input(s): PROCALCITON, LATICACIDVEN in the last 168 hours.  No results found for this or any previous visit (from the past 240 hours).   Radiology Studies: No results found.  Scheduled Meds:  carvedilol   10 mg Oral Daily   furosemide   40 mg Oral Daily   gabapentin   200 mg Oral BID   lisinopril   40 mg Oral Daily   lurasidone   40 mg Oral Q breakfast   rivaroxaban   20 mg Oral QHS   rosuvastatin   5 mg Oral QHS   spironolactone   25 mg Oral Daily   Continuous Infusions:   LOS: 136 days    Time spent: 35 mins    Darcel Dawley, MD [facility]   If 7PM-7AM, please contact night-coverage

## 2024-07-18 NOTE — Plan of Care (Signed)
  Problem: Nutrition: Goal: Adequate nutrition will be maintained Outcome: Progressing   Problem: Coping: Goal: Level of anxiety will decrease Outcome: Progressing   Problem: Elimination: Goal: Will not experience complications related to bowel motility Outcome: Progressing Goal: Will not experience complications related to urinary retention Outcome: Progressing   Problem: Pain Managment: Goal: General experience of comfort will improve and/or be controlled Outcome: Progressing   Problem: Safety: Goal: Ability to remain free from injury will improve Outcome: Progressing   Problem: Skin Integrity: Goal: Risk for impaired skin integrity will decrease Outcome: Progressing

## 2024-07-19 DIAGNOSIS — I639 Cerebral infarction, unspecified: Secondary | ICD-10-CM | POA: Diagnosis not present

## 2024-07-19 NOTE — TOC Progression Note (Signed)
 Transition of Care [facility]) - Progression Note    Patient Details  Name: Jovane Foutz MRN: [id number] Date of Birth: [birth year]  Transition of Care [facility]) CM/SW Contact  [name], [name] Phone Number: 07/19/2024, 10:33 AM  Clinical Narrative:   CSW met with patient in the hall. He reported that the attorney has still not come to complete the POA paperwork, he hasn't heard anything. CSW encouraged patient to reach out to [place] to discuss.  CSW received a call from [place] asking about patient's disposition. CSW reminded him again that the hospital was not doing anything until the results of the competency hearing. [name] again is asking to speak with someone to discuss, as he is reporting that the completion of the POA document will be what Armoni needs and the competency hearing does not need to continue. CSW to speak with [facility] Supervisor about the situation.    Expected Discharge Plan: Skilled Nursing Facility Barriers to Discharge: English as a second language teacher, Active Substance Use - Placement, SNF Pending bed offer               Expected Discharge Plan and Services       Living arrangements for the past 2 months: Apartment Expected Discharge Date: 03/14/24                                     Social Drivers of Health (SDOH) Interventions SDOH Screenings   Food Insecurity: Food Insecurity Present (03/06/2024)  Housing: High Risk (03/06/2024)  Transportation Needs: No Transportation Needs (03/06/2024)  Utilities: Patient Declined (03/06/2024)  Alcohol Screen: Medium Risk (06/24/2019)  Depression (PHQ2-9): Low Risk  (02/18/2024)  Recent Concern: Depression (PHQ2-9) - Medium Risk (01/21/2024)  Financial Resource Strain: High Risk (07/24/2021)   Received from [facility]  Physical Activity: Inactive (02/18/2024)  Social Connections: Unknown (04/05/2022)   Received from [facility]  Stress: Stress Concern Present (07/24/2021)   Received from [facility]  Tobacco Use: High Risk  (03/04/2024)  Health Literacy: Adequate Health Literacy (02/18/2024)    Readmission Risk Interventions    12/21/2023    9:25 AM 12/09/2022    9:02 AM 12/01/2022    3:56 PM  Readmission Risk Prevention Plan  Transportation Screening Complete Complete Complete  HRI or Home Care Consult Complete    Social Work Consult for Recovery Care Planning/Counseling Complete    Palliative Care Screening Not Applicable    Medication Review Oceanographer) Complete Complete Complete  PCP or Specialist appointment within 3-5 days of discharge  Complete Complete  HRI or Home Care Consult  Complete Complete  SW Recovery Care/Counseling Consult  Complete Complete  Palliative Care Screening  Not Applicable Not Applicable  Skilled Nursing Facility  Not Applicable Not Applicable  SNF Comments   n/a

## 2024-07-19 NOTE — TOC Progression Note (Signed)
 LATE NOTE SUBMISSION    Transition of Care [facility]) - Progression Note    Patient Details  Name: [name] MRN: [id number] Date of Birth: [birth year]  Transition of Care [facility]) CM/SW Contact  [name], [name] Phone Number: 07/19/2024, 10:29 AM  Clinical Narrative:   CSW spoke with patient's caregiver, [name]. Per [name], the other attorney completing the POA met with [name] on Monday, and was supposed to come back to complete the paperwork with witnesses, but she has not heard anything back. CSW updated [name] that she is unable to assist with the completion of the POA as the hospital does not agree with the GAL's assessment. [name] indicated that [name] has also been telling her that Shizuo can be discharged based on his assessment, so she does not know what to do. CSW discussed concerns about patient not being able to answer questions regarding where he would live and how much he could afford, and [name] said she would attempt to figure out what Lessie's monthly budget looks like. CSW to follow.    Expected Discharge Plan: Skilled Nursing Facility Barriers to Discharge: English as a second language teacher, Active Substance Use - Placement, SNF Pending bed offer               Expected Discharge Plan and Services       Living arrangements for the past 2 months: Apartment Expected Discharge Date: 03/14/24                                     Social Drivers of Health (SDOH) Interventions SDOH Screenings   Food Insecurity: Food Insecurity Present (03/06/2024)  Housing: High Risk (03/06/2024)  Transportation Needs: No Transportation Needs (03/06/2024)  Utilities: Patient Declined (03/06/2024)  Alcohol Screen: Medium Risk (06/24/2019)  Depression (PHQ2-9): Low Risk  (02/18/2024)  Recent Concern: Depression (PHQ2-9) - Medium Risk (01/21/2024)  Financial Resource Strain: High Risk (07/24/2021)   Received from [facility]  Physical Activity: Inactive (02/18/2024)  Social Connections: Unknown  (04/05/2022)   Received from [facility]  Stress: Stress Concern Present (07/24/2021)   Received from [facility]  Tobacco Use: High Risk (03/04/2024)  Health Literacy: Adequate Health Literacy (02/18/2024)    Readmission Risk Interventions    12/21/2023    9:25 AM 12/09/2022    9:02 AM 12/01/2022    3:56 PM  Readmission Risk Prevention Plan  Transportation Screening Complete Complete Complete  HRI or Home Care Consult Complete    Social Work Consult for Recovery Care Planning/Counseling Complete    Palliative Care Screening Not Applicable    Medication Review Oceanographer) Complete Complete Complete  PCP or Specialist appointment within 3-5 days of discharge  Complete Complete  HRI or Home Care Consult  Complete Complete  SW Recovery Care/Counseling Consult  Complete Complete  Palliative Care Screening  Not Applicable Not Applicable  Skilled Nursing Facility  Not Applicable Not Applicable  SNF Comments   n/a

## 2024-07-19 NOTE — TOC Progression Note (Signed)
 LATE NOTE SUBMISSION    Transition of Care [facility]) - Progression Note    Patient Details  Name: [name] MRN: [id number] Date of Birth: [birth year]  [facility] CM/SW Contact  [name], [name] Phone Number: 07/19/2024, 10:24 AM  Clinical Narrative:   CSW met with patient's GAL, [name], to discuss progress with disposition. [name] was still under the impression that CSW was working towards discharge since he recommends a POA document, and CSW reiterated that the hospital does not agree with his assessment and will wait for the results of the patient's court hearing. [name] requested to speak with ethics or whoever was making these decisions, and CSW contacted [name] Supervisor to discuss.   CSW attempted to meet with patient to discuss his plans for discharge, if he is able to make his own decisions. Patient said he was working with [name] on finding somewhere to go, maybe in [place], as he has lived in [place], [place], and [address], and he is tired of those places. CSW asked patient about a budget, and he was not able to answer the question.     Expected Discharge Plan: Skilled Nursing Facility Barriers to Discharge: English as a second language teacher, Active Substance Use - Placement, SNF Pending bed offer               Expected Discharge Plan and Services       Living arrangements for the past 2 months: Apartment Expected Discharge Date: 03/14/24                                     Social Drivers of Health (SDOH) Interventions SDOH Screenings   Food Insecurity: Food Insecurity Present (03/06/2024)  Housing: High Risk (03/06/2024)  Transportation Needs: No Transportation Needs (03/06/2024)  Utilities: Patient Declined (03/06/2024)  Alcohol Screen: Medium Risk (06/24/2019)  Depression (PHQ2-9): Low Risk  (02/18/2024)  Recent Concern: Depression (PHQ2-9) - Medium Risk (01/21/2024)  Financial Resource Strain: High Risk (07/24/2021)   Received from [facility]   Physical Activity: Inactive (02/18/2024)  Social Connections: Unknown (04/05/2022)   Received from [facility]  Stress: Stress Concern Present (07/24/2021)   Received from [facility]  Tobacco Use: High Risk (03/04/2024)  Health Literacy: Adequate Health Literacy (02/18/2024)    Readmission Risk Interventions    12/21/2023    9:25 AM 12/09/2022    9:02 AM 12/01/2022    3:56 PM  Readmission Risk Prevention Plan  Transportation Screening Complete Complete Complete  HRI or Home Care Consult Complete    Social Work Consult for Recovery Care Planning/Counseling Complete    Palliative Care Screening Not Applicable    Medication Review Oceanographer) Complete Complete Complete  PCP or Specialist appointment within 3-5 days of discharge  Complete Complete  HRI or Home Care Consult  Complete Complete  SW Recovery Care/Counseling Consult  Complete Complete  Palliative Care Screening  Not Applicable Not Applicable  Skilled Nursing Facility  Not Applicable Not Applicable  SNF Comments   n/a

## 2024-07-19 NOTE — TOC Progression Note (Signed)
 Transition of Care [facility]) - Progression Note    Patient Details  Name: [name] MRN: [id number] Date of Birth: 09-Feb-1966  Transition of Care [facility]) CM/SW Contact  [name], [name] Phone Number: 07/19/2024, 10:46 AM  Clinical Narrative:   CSW spoke with ICM Supervisor to discuss concerns related to GAL and guardianship hearing. ICM Supervisor to discuss with DSS on how to proceed. CSW to follow.    Expected Discharge Plan: Skilled Nursing Facility Barriers to Discharge: English as a second language teacher, Active Substance Use - Placement, SNF Pending bed offer               Expected Discharge Plan and Services       Living arrangements for the past 2 months: Apartment Expected Discharge Date: 03/14/24                                     Social Drivers of Health (SDOH) Interventions SDOH Screenings   Food Insecurity: Food Insecurity Present (03/06/2024)  Housing: High Risk (03/06/2024)  Transportation Needs: No Transportation Needs (03/06/2024)  Utilities: Patient Declined (03/06/2024)  Alcohol Screen: Medium Risk (06/24/2019)  Depression (PHQ2-9): Low Risk  (02/18/2024)  Recent Concern: Depression (PHQ2-9) - Medium Risk (01/21/2024)  Financial Resource Strain: High Risk (07/24/2021)   Received from [facility]  Physical Activity: Inactive (02/18/2024)  Social Connections: Unknown (04/05/2022)   Received from [facility]  Stress: Stress Concern Present (07/24/2021)   Received from [facility]  Tobacco Use: High Risk (03/04/2024)  Health Literacy: Adequate Health Literacy (02/18/2024)    Readmission Risk Interventions    12/21/2023    9:25 AM 12/09/2022    9:02 AM 12/01/2022    3:56 PM  Readmission Risk Prevention Plan  Transportation Screening Complete Complete Complete  HRI or Home Care Consult Complete    Social Work Consult for Recovery Care Planning/Counseling Complete    Palliative Care Screening Not Applicable    Medication Review Oceanographer)  Complete Complete Complete  PCP or Specialist appointment within 3-5 days of discharge  Complete Complete  HRI or Home Care Consult  Complete Complete  SW Recovery Care/Counseling Consult  Complete Complete  Palliative Care Screening  Not Applicable Not Applicable  Skilled Nursing Facility  Not Applicable Not Applicable  SNF Comments   n/a

## 2024-07-19 NOTE — Progress Notes (Signed)
 PROGRESS NOTE    [name]  FMW:[id number] DOB: 1966/07/11 DOA: 03/04/2024 PCP: Tanda Bleacher, MD  Brief Narrative:  This 58 year old male with medical history significant for COPD, cocaine abuse, presented to the ED 03/04/24 for slower speech, confusion and right-sided weakness, CT head showed left MCA infarct, CTA head and neck revealed an M2 occlusion on the left,   Echocardiogram revealed large mobile left ventricular thrombus on the left and right ventricular apical thrombus in the right, ejection fraction less than 20%.  Cardiology was consulted recommended DOAC.  PT evaluated patient and recommended skilled nursing facility placement.  Patient was found to lack medical decision capacity/status and not amenable to determining his discharge disposition or leave AGAINST MEDICAL ADVICE.   TOC is pursuing guardianship with APS as he is lacking medical decision capacity. He did complain of halluacinations, eating poor, refused medications, has insomnia.  Assessment & Plan:   Principal Problem:   Acute CVA (cerebrovascular accident) (HCC) Active Problems:   LV (left ventricular) mural thrombus   Chronic systolic CHF (congestive heart failure) (HCC)   Impaired decision making   Prolonged QT interval   Elevated liver enzymes   COPD (chronic obstructive pulmonary disease) (HCC)   Cocaine abuse (HCC)   Schizoaffective disorder, bipolar type (HCC)   Benign essential HTN   Acid reflux   History of DVT (deep vein thrombosis)   Type 2 diabetes mellitus (HCC)   Status post bilateral BKA.   Dilated cardiomyopathy (HCC)  Acute CVA (cerebrovascular accident) [facility]): He underwent extensive workup and imaging revealed left MCA territory infarct, involving caudate nucleus, lentiform nucleus and internal capsule confirmed on MRI. TEE was noted for large mobile left lower clot just under the mitral valve with an EF of 20% and a small right apical clot with reduced RV systolic function. Psychiatry  has deemed the patient to lack capacity; he continues to remain noncompliant at times. Unable to force meds.  Continue offering throughout the day as able.   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 Last 2D echo showed an EF of less than 20% Continue Xarelto  20 mg PO daily    Schizoaffective disorder / bipolar disorder / lacks medical capacity: Psych was consulted and recommended that the patient lacks medical capacity to make decisions if the patient attempts to leave AGAINST MEDICAL ADVICE he should be involuntarily committed. Psych recommending APS guardianship, TOC in touch with APS regarding this as patient lacks medical capacity.   Essential hypertension: Continue - Coreg  CR 24 hr 10 mg PO daily - Lisinopril  40 mg PO daily   - Aldactone  25 mg PO daily  - Hydralazine  25 mg PO q6 hr PRN  - IV labetalol  20 mg q3 hr PRN    Hyperlipidemia: Continue Crestor  5 mg PO at bedtime.   Diabetes mellitus type 2: - last A1c of 5.4. refuses monitoring  - Ensure enlive PO bid  - Gabapentin  200 mg PO bid    Polysubstance abuse: - Ativan  1 mg PO q6 hr PRN  - Latuda  40 mg PO daily  - Oxycodone  10 mg PO q3 hr PRN    Deconditioning: Ambulatory status complicated by previous right AKA and left BKA,  has no insurance coverage for SNF. - refusing therapy at times. - Meclizine  25 mg PO tid PRN    History of COPD: - Albuterol  q6 hr PRN nebs   History of DVT: - Xarelto  20 mg PO daily    DVT  prophylaxis: Xarelto  Code Status:Full code Family Communication: No family at bed side. Disposition Plan:   Awaiting safe disposition.  Consultants:  Psychiatry  Procedures: None  Antimicrobials:  Anti-infectives (From admission, onward)    None      Subjective: Patient was seen and examined at bedside.  Overnight events noted. Patient was sitting comfortably on the wheelchair, denies any concerns. Patient reports  pain is now reasonably controlled with increase in frequency of pain medication.    Objective: Vitals:   07/18/24 1156 07/18/24 2035 07/18/24 2313 07/19/24 0827  BP: 111/76 124/77 136/83 122/85  Pulse: 82 76 74 71  Resp: 18 18 17 20   TempSrc:      SpO2: (!) 85% 100% 97% 97%  Weight:      Height:       No intake or output data in the 24 hours ending 07/19/24 1020   Filed Weights   03/26/24 0431 03/28/24 0456 04/04/24 0500  Weight: 42.7 kg 47.5 kg 61.7 kg    Examination:  General exam: Appears calm and comfortable, deconditioned, not in any acute distress. Respiratory system: CTA Bilaterally. Respiratory effort normal.  RR 14 Cardiovascular system: S1 & S2 heard, RRR. No JVD, murmurs, rubs, gallops or clicks.  Gastrointestinal system: Abdomen is non distended, soft and non tender. Normal bowel sounds heard. Central nervous system: Alert and oriented x 3. No focal neurological deficits. Extremities: Right AKA, left BKA Skin: No rashes, lesions or ulcers Psychiatry: Judgement and insight appear normal. Mood & affect appropriate.     Data Reviewed: I have personally reviewed following labs and imaging studies  CBC: Recent Labs  Lab 07/13/24 0751  WBC 5.8  HGB 14.3  HCT 41.9  MCV 90.9  PLT 207   Basic Metabolic Panel: Recent Labs  Lab 07/13/24 0751  NA 138  K 4.2  CL 105  CO2 24  GLUCOSE 116*  BUN 12  CREATININE 1.12  CALCIUM  9.1  MG 1.8   GFR: Estimated Creatinine Clearance: 64.3 mL/min (by C-G formula based on SCr of 1.12 mg/dL). Liver Function Tests: Recent Labs  Lab 07/13/24 0751  AST 24  ALT 20  ALKPHOS 57  BILITOT 0.9  PROT 6.6  ALBUMIN 3.5   No results for input(s): LIPASE, AMYLASE in the last 168 hours. No results for input(s): AMMONIA in the last 168 hours. Coagulation Profile: No results for input(s): INR, PROTIME in the last 168 hours. Cardiac Enzymes: No results for input(s): CKTOTAL, CKMB, CKMBINDEX, TROPONINI in  the last 168 hours. BNP (last 3 results) No results for input(s): PROBNP in the last 8760 hours. HbA1C: No results for input(s): HGBA1C in the last 72 hours. CBG: No results for input(s): GLUCAP in the last 168 hours. Lipid Profile: No results for input(s): CHOL, HDL, LDLCALC, TRIG, CHOLHDL, LDLDIRECT in the last 72 hours. Thyroid  Function Tests: No results for input(s): TSH, T4TOTAL, FREET4, T3FREE, THYROIDAB in the last 72 hours. Anemia Panel: No results for input(s): VITAMINB12, FOLATE, FERRITIN, TIBC, IRON, RETICCTPCT in the last 72 hours. Sepsis Labs: No results for input(s): PROCALCITON, LATICACIDVEN in the last 168 hours.  No results found for this or any previous visit (from the past 240 hours).   Radiology Studies: No results found.  Scheduled Meds:  carvedilol   10 mg Oral Daily   furosemide   40 mg Oral Daily   gabapentin   200 mg Oral BID   lisinopril   40 mg Oral Daily   lurasidone   40 mg Oral Q breakfast   rivaroxaban   20 mg Oral QHS   rosuvastatin   5 mg Oral QHS   spironolactone   25 mg Oral Daily   Continuous Infusions:   LOS: 137 days    Time spent: 35 mins    Darcel Dawley, MD [facility]   If 7PM-7AM, please contact night-coverage

## 2024-07-20 DIAGNOSIS — I639 Cerebral infarction, unspecified: Secondary | ICD-10-CM | POA: Diagnosis not present

## 2024-07-20 LAB — CBC
HCT: 41.7 % (ref 39.0–52.0)
Hemoglobin: 14.2 g/dL (ref 13.0–17.0)
MCH: 30.9 pg (ref 26.0–34.0)
MCHC: 34.1 g/dL (ref 30.0–36.0)
MCV: 90.8 fL (ref 80.0–100.0)
Platelets: 205 K/uL (ref 150–400)
RBC: 4.59 MIL/uL (ref 4.22–5.81)
RDW: 14.5 % (ref 11.5–15.5)
WBC: 6.4 K/uL (ref 4.0–10.5)
nRBC: 0 % (ref 0.0–0.2)

## 2024-07-20 NOTE — Progress Notes (Signed)
 PROGRESS NOTE  [name]  DOB: February 02, 1966  PCP: Tanda Bleacher, MD FMW:[id number]  DOA: 03/04/2024  LOS: 138 days  Hospital Day: 139  Brief narrative: [name] is a 58 y.o. male with PMH significant for homelessness, DM2, HTN, CHF, bipolar disorder, schizoaffective disorder, cocaine abuse, COPD Patient is a to the ED 03/04/24 for slower speech, confusion and right-sided weakness CT head showed left MCA infarct CTA head and neck revealed an M2 occlusion on the left,   Stroke workup was completed Echocardiogram revealed large mobile left ventricular thrombus on the left and right ventricular apical thrombus in the right, ejection fraction less than 20%.   Cardiology was consulted recommended DOAC. PT evaluated patient and recommended skilled nursing facility placement.  Patient was found to lack medical decision capacity/status and not amenable to determining his discharge disposition or leave AGAINST MEDICAL ADVICE.  TOC is pursuing guardianship with APS as he is lacking medical decision capacity. He did complain of halluacinations, eating poor, refused medications, has insomnia.   Subjective: Patient was seen and examined this afternoon.  Wheeling around on wheelchair.  Not in distress Chart reviewed. Afebrile, hemodynamically stable CBC this morning unremarkable  Assessment and plan: Acute CVA (cerebrovascular accident)  He underwent extensive workup and imaging revealed left MCA territory infarct, involving caudate nucleus, lentiform nucleus and internal capsule confirmed on MRI. TEE was noted for large mobile left lower clot just under the mitral valve with an EF of 20% and a small right apical clot with reduced RV systolic function. Psychiatry has deemed the patient to lack capacity; he continues to remain noncompliant at times. Unable to force meds.  Continue offering throughout the day as able.   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 Last 2D echo showed an EF of less than 20% Continue Xarelto  20 mg PO daily    Schizoaffective disorder / bipolar disorder / lacks medical capacity: Psych was consulted and recommended that the patient lacks medical capacity to make decisions if the patient attempts to leave AGAINST MEDICAL ADVICE he should be involuntarily committed. Psych recommending APS guardianship, TOC in touch with APS regarding this as patient lacks medical capacity.   Essential hypertension: Continue - Coreg  CR 24 hr 10 mg PO daily - Lisinopril  40 mg PO daily   - Aldactone  25 mg PO daily  - Hydralazine  25 mg PO q6 hr PRN  - IV labetalol  20 mg q3 hr PRN    Hyperlipidemia: Continue Crestor  5 mg PO at bedtime.   Diabetes mellitus type 2: - last A1c of 5.4. refuses monitoring  - Ensure enlive PO bid  - Gabapentin  200 mg PO bid    Polysubstance abuse: - Ativan  1 mg PO q6 hr PRN  - Latuda  40 mg PO daily  - Oxycodone  10 mg PO q3 hr PRN    Deconditioning: Ambulatory status complicated by previous right AKA and left BKA,  has no insurance coverage for SNF. - refusing therapy at times. - Meclizine  25 mg PO tid PRN    History of COPD: - Albuterol  q6 hr PRN nebs   History of DVT: - Xarelto  20 mg PO daily    Mobility:  PT Orders: Active   PT Follow up Rec: Home Health Pt (Followed By Outpt Pt For Continuing Prosthesis Training; [name] Manage Safely In An Accessible Living Space With Assist/Supervision For Medications, Meals)07/18/2024 1400   Goals of care   Code Status: Full Code  DVT prophylaxis:   rivaroxaban  (XARELTO ) tablet 20 mg   Antimicrobials: None currently Fluid: None Consultants: Psychiatry Family Communication: None at bedside  Status: Inpatient Level of care:  Med-Surg   Patient is from: Homeless Anticipated d/c to: Difficulty disposition   Diet:  Diet Order             Diet regular Fluid consistency: Thin  Diet effective  now           Diet - low sodium heart healthy                   Scheduled Meds:  carvedilol   10 mg Oral Daily   furosemide   40 mg Oral Daily   gabapentin   200 mg Oral BID   lisinopril   40 mg Oral Daily   lurasidone   40 mg Oral Q breakfast   rivaroxaban   20 mg Oral QHS   rosuvastatin   5 mg Oral QHS   spironolactone   25 mg Oral Daily    PRN meds: albuterol , feeding supplement, hydrALAZINE , labetalol , lactulose , LORazepam , meclizine , mouth rinse, oxyCODONE    Infusions:    Antimicrobials: Anti-infectives (From admission, onward)    None       Objective: Vitals:   07/19/24 2328 07/20/24 0342  BP: 122/84 (!) 138/90  Pulse: 88 68  Resp: 18 18  SpO2: 95% 98%   No intake or output data in the 24 hours ending 07/20/24 1507 Filed Weights   03/26/24 0431 03/28/24 0456 04/04/24 0500  Weight: 42.7 kg 47.5 kg 61.7 kg   Weight change:  Body mass index is 20.09 kg/m.   Physical Exam: General exam: Pleasant, calm, comfortable Skin: No rashes, lesions or ulcers. HEENT: Atraumatic, normocephalic, no obvious bleeding Lungs: Clear to auscultation bilaterally,  CVS: S1, S2, no murmur,   GI/Abd: Soft, nontender, nondistended, bowel sound present,   CNS: Alert, awake, Psychiatry: Mood appropriate Extremities: Right AKA, left BKA status  Data Review: I have personally reviewed the laboratory data and studies available.  F/u labs  Unresulted Labs (From admission, onward)    None      Signed, Chapman Rota, MD [facility] 07/20/2024

## 2024-07-21 DIAGNOSIS — I639 Cerebral infarction, unspecified: Secondary | ICD-10-CM | POA: Diagnosis not present

## 2024-07-21 NOTE — Progress Notes (Signed)
 Chaplain followed up with [name], providing reflective listening, compassionate presence and prayer. Please reach out to us  as needs arise.

## 2024-07-21 NOTE — Progress Notes (Signed)
 PROGRESS NOTE  [name]  DOB: [birth year]  PCP: Tanda Bleacher, MD FMW:[id number]  DOA: 03/04/2024  LOS: 139 days  Hospital Day: 140  Brief narrative: [name] is a 58 y.o. male with PMH significant for homelessness, DM2, HTN, CHF, bipolar disorder, schizoaffective disorder, cocaine abuse, COPD Patient is a to the ED 03/04/24 for slower speech, confusion and right-sided weakness CT head showed left MCA infarct CTA head and neck revealed an M2 occlusion on the left,   Stroke workup was completed Echocardiogram revealed large mobile left ventricular thrombus on the left and right ventricular apical thrombus in the right, ejection fraction less than 20%.   Cardiology was consulted recommended DOAC. PT evaluated patient and recommended skilled nursing facility placement.  Patient was found to lack medical decision capacity/status and not amenable to determining his discharge disposition or leave AGAINST MEDICAL ADVICE.  TOC is pursuing guardianship with APS as he is lacking medical decision capacity. He did complain of halluacinations, eating poor, refused medications, has insomnia.   Subjective: Patient was seen and examined this morning. Sitting up in wheelchair.  Not in distress.  Bilateral lower leg amputation status.   Hemodynamically stable  Assessment and plan: Acute CVA (cerebrovascular accident)  He underwent extensive workup and imaging revealed left MCA territory infarct, involving caudate nucleus, lentiform nucleus and internal capsule confirmed on MRI. TEE was noted for large mobile left lower clot just under the mitral valve with an EF of 20% and a small right apical clot with reduced RV systolic function. Psychiatry has deemed the patient to lack capacity; he continues to remain noncompliant at times. Unable to force meds.  Continue offering throughout the day as able.   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 Last 2D echo showed an EF of less than 20% Continue Xarelto  20 mg PO daily    Schizoaffective disorder / bipolar disorder / lacks medical capacity: Psych was consulted and recommended that the patient lacks medical capacity to make decisions if the patient attempts to leave AGAINST MEDICAL ADVICE he should be involuntarily committed. Psych recommending APS guardianship, TOC in touch with APS regarding this as patient lacks medical capacity.   Essential hypertension: Continue - Coreg  CR 24 hr 10 mg PO daily - Lisinopril  40 mg PO daily   - Aldactone  25 mg PO daily  - Hydralazine  25 mg PO q6 hr PRN  - IV labetalol  20 mg q3 hr PRN    Hyperlipidemia: Continue Crestor  5 mg PO at bedtime.   Diabetes mellitus type 2: - last A1c of 5.4. refuses monitoring  - Ensure enlive PO bid  - Gabapentin  200 mg PO bid    Polysubstance abuse: - Ativan  1 mg PO q6 hr PRN  - Latuda  40 mg PO daily  - Oxycodone  10 mg PO q3 hr PRN    Deconditioning: Ambulatory status complicated by previous right AKA and left BKA,  has no insurance coverage for SNF. - refusing therapy at times. - Meclizine  25 mg PO tid PRN    History of COPD: - Albuterol  q6 hr PRN nebs   History of DVT: - Xarelto  20 mg PO daily    Mobility:  PT Orders: Active   PT Follow up Rec: Home Health Pt (Followed By Outpt Pt For Continuing Prosthesis Training; [name] Manage Safely In An Accessible Living Space With Assist/Supervision For Medications, Meals)07/18/2024 1400   Goals of care   Code Status: Full Code  DVT prophylaxis:   rivaroxaban  (XARELTO ) tablet 20 mg   Antimicrobials: None currently Fluid: None Consultants: Psychiatry Family Communication: None at bedside  Status: Inpatient Level of care:  Med-Surg   Patient is from: Homeless Anticipated d/c to: Difficulty disposition   Diet:  Diet Order             Diet regular Fluid consistency: Thin  Diet effective now           Diet - low  sodium heart healthy                   Scheduled Meds:  carvedilol   10 mg Oral Daily   furosemide   40 mg Oral Daily   gabapentin   200 mg Oral BID   lisinopril   40 mg Oral Daily   lurasidone   40 mg Oral Q breakfast   rivaroxaban   20 mg Oral QHS   rosuvastatin   5 mg Oral QHS   spironolactone   25 mg Oral Daily    PRN meds: albuterol , feeding supplement, hydrALAZINE , labetalol , lactulose , LORazepam , meclizine , mouth rinse, oxyCODONE    Infusions:    Antimicrobials: Anti-infectives (From admission, onward)    None       Objective: Vitals:   07/20/24 2347 07/21/24 1152  BP: 127/73 133/78  Pulse: 82 78  Resp: 18 18  SpO2: 100% 100%    Intake/Output Summary (Last 24 hours) at 07/21/2024 1342 Last data filed at 07/21/2024 1000 Gross per 24 hour  Intake 480 ml  Output --  Net 480 ml   Filed Weights   03/26/24 0431 03/28/24 0456 04/04/24 0500  Weight: 42.7 kg 47.5 kg 61.7 kg   Weight change:  Body mass index is 20.09 kg/m.   Physical Exam: General exam: Pleasant, calm, comfortable Skin: No rashes, lesions or ulcers. HEENT: Atraumatic, normocephalic, no obvious bleeding Lungs: Clear to auscultation bilaterally,  CVS: S1, S2, no murmur,   GI/Abd: Soft, nontender, nondistended, bowel sound present,   CNS: Alert, awake, Psychiatry: Mood appropriate Extremities: Right AKA, left BKA status  Data Review: I have personally reviewed the laboratory data and studies available.  F/u labs  Unresulted Labs (From admission, onward)    None      Signed, Chapman Rota, MD [facility] 07/21/2024

## 2024-07-21 NOTE — Progress Notes (Signed)
 SLP Cancellation Note  Patient Details Name: [name] MRN: [id number] DOB: 1966/04/06   Cancelled treatment:       Reason Eval/Treat Not Completed: Other (comment) Attempted to see pt this afternoon for speech therapy, but he was meeting with a visitor and asked for privacy. SLP will f/u at another time.     Leita SAILOR., M.A. CCC-SLP Acute Rehabilitation Services Office: [phone]  Secure chat preferred  07/21/2024, 2:57 PM

## 2024-07-21 NOTE — Progress Notes (Signed)
 Occupational Therapy Treatment Patient Details Name: [name] MRN: [id number] DOB: Jan 03, 1966 Today's Date: 07/21/2024   History of present illness Patient is 58 y.o. male presented to ED on 03/04/24 with stroke like symptoms when caregiver found him with slurred speech and right sided weakness. Head CT reveals Acute left MCA territory infarct affecting the insula, caudate nucleus, lentiform nucleus and internal capsule. PMH significant of  Schizoaffective disorder, heart failure with reduced ejection fraction, pretension, DMII, COPD, s/p bilateral amputations, chronic pain, and polysubstance abuse.   OT comments  Patient continues to require supervision for safety only for transfer to/from tub bench to/from wheelchair and to perform bathing seated on tub bench. Patient attempted to pull up scrub bottoms while seated in recliner and required in assist to pull up with patient using BUEs to clear bottom. Discharge recommendations continue to be appropriate. Acute OT to continue to follow to address established goals to facilitate DC to next venue of care.        If plan is discharge home, recommend the following:  A little help with walking and/or transfers;A little help with bathing/dressing/bathroom;Supervision due to cognitive status;Direct supervision/assist for financial management;Direct supervision/assist for medications management   Equipment Recommendations  None recommended by OT    Recommendations for Other Services      Precautions / Restrictions Precautions Precautions: Fall Recall of Precautions/Restrictions: Impaired Precaution/Restrictions Comments: h/o BLE amputations Required Braces or Orthoses: Other Brace Other Brace: L prosthesis; and R prosthesis with Prosthetist present to chick initial socket fit Restrictions Weight Bearing Restrictions Per Provider Order: No       Mobility Bed Mobility Overal bed mobility: Modified Independent             General bed  mobility comments: OOB in wheelchair    Transfers Overall transfer level: Needs assistance Equipment used: None Transfers: Bed to chair/wheelchair/BSC            Lateral/Scoot Transfers: Supervision General transfer comment: supervision for transfer to/from tub bench to /from wheelchair     Balance Overall balance assessment: Mild deficits observed, not formally tested Sitting-balance support: Bilateral upper extremity supported Sitting balance-Leahy Scale: Good                                     ADL either performed or assessed with clinical judgement   ADL Overall ADL's : Needs assistance/impaired         Upper Body Bathing: Supervision/ safety;Sitting Upper Body Bathing Details (indicate cue type and reason): in shower Lower Body Bathing: Supervison/ safety;Sitting/lateral leans Lower Body Bathing Details (indicate cue type and reason): in shower Upper Body Dressing : Set up;Sitting   Lower Body Dressing: Minimal assistance;Sitting/lateral leans Lower Body Dressing Details (indicate cue type and reason): assistance pulling up pants while in the chair and patient using UEs to clear bottom         Tub/ Shower Transfer: Supervision/safety;Tub transfer;Tub bench Tub/Shower Transfer Details (indicate cue type and reason): supervision for safety   General ADL Comments: supervision for safety only for bathing in shower and transfers, patient demonstrating good safety with transfers    Extremity/Trunk Assessment              Vision       Perception     Praxis     Communication Communication Communication: Impaired Factors Affecting Communication: Difficulty expressing self;Reduced clarity of speech   Cognition Arousal: Alert Behavior During  Therapy: Flat affect Cognition: Cognition impaired     Awareness: Intellectual awareness intact, Online awareness impaired   Attention impairment (select first level of impairment): Selective  attention                     Following commands: Intact Following commands impaired: Follows one step commands inconsistently, Follows one step commands with increased time      Cueing   Cueing Techniques: Verbal cues, Visual cues  Exercises      Shoulder Instructions       General Comments      Pertinent Vitals/ Pain       Pain Assessment Pain Assessment: No/denies pain Pain Intervention(s): Monitored during session  Home Living                                          Prior Functioning/Environment              Frequency  Min 1X/week        Progress Toward Goals  OT Goals(current goals can now be found in the care plan section)  Progress towards OT goals: Progressing toward goals  Acute Rehab OT Goals Patient Stated Goal: increase independence OT Goal Formulation: With patient Time For Goal Achievement: 07/28/24 Potential to Achieve Goals: Fair ADL Goals Pt Will Perform Lower Body Bathing: with mod assist;sitting/lateral leans Pt Will Perform Lower Body Dressing: with modified independence;sitting/lateral leans;bed level Pt Will Transfer to Toilet: with modified independence;bedside commode (drop arm BSC, no cues for safety) Pt Will Perform Toileting - Clothing Manipulation and hygiene: with mod assist;sitting/lateral leans Pt Will Perform Tub/Shower Transfer: Tub transfer;tub bench;with modified independence Pt/caregiver will Perform Home Exercise Program: Right Upper extremity;Independently;With written HEP provided (to increase coordination with use of RUE) Additional ADL Goal #1: Pt will transfer to w/c with Mod I as a precursor for OOB ADLs Additional ADL Goal #2: Pt will utilize RUE to gather 5/5 objects without overshooting. Additional ADL Goal #3: Pt will demonstrate proper safety with transfers/mobility 100% of the time during session. Additional ADL Goal #4: Pt will demonstrate appropriate safety awareness 100% of the  time when positioning w/c to transfer  Plan      Co-evaluation                 AM-PAC OT 6 Clicks Daily Activity     Outcome Measure   Help from another person eating meals?: None Help from another person taking care of personal grooming?: A Little Help from another person toileting, which includes using toliet, bedpan, or urinal?: A Little Help from another person bathing (including washing, rinsing, drying)?: A Little Help from another person to put on and taking off regular upper body clothing?: A Little Help from another person to put on and taking off regular lower body clothing?: A Little 6 Click Score: 19    End of Session Equipment Utilized During Treatment: Other (comment) (wheelchair and tub bench)  OT Visit Diagnosis: Unsteadiness on feet (R26.81);Other abnormalities of gait and mobility (R26.89);Muscle weakness (generalized) (M62.81);Other symptoms and signs involving the nervous system (R29.898);Other symptoms and signs involving cognitive function;Hemiplegia and hemiparesis Hemiplegia - Right/Left: Right Hemiplegia - dominant/non-dominant: Dominant Hemiplegia - caused by: Cerebral infarction   Activity Tolerance Patient tolerated treatment well   Patient Left in chair;with call bell/phone within reach   Nurse Communication Mobility status  Time: 9141-9076 OT Time Calculation (min): 25 min  Charges: OT General Charges $OT Visit: 1 Visit OT Treatments $Self Care/Home Management : 23-37 mins  [name], OTA Acute Rehabilitation Services  Office [phone]   [name] 07/21/2024, 12:26 PM

## 2024-07-22 DIAGNOSIS — I639 Cerebral infarction, unspecified: Secondary | ICD-10-CM | POA: Diagnosis not present

## 2024-07-22 NOTE — Progress Notes (Signed)
 PROGRESS NOTE  [name]  DOB: 1966-11-04  PCP: Tanda Bleacher, MD FMW:[id number]  DOA: 03/04/2024  LOS: 140 days  Hospital Day: 141  Brief narrative: [name] is a 58 y.o. male with PMH significant for homelessness, DM2, HTN, CHF, bipolar disorder, schizoaffective disorder, cocaine abuse, COPD Patient is a to the ED 03/04/24 for slower speech, confusion and right-sided weakness CT head showed left MCA infarct CTA head and neck revealed an M2 occlusion on the left,   Stroke workup was completed Echocardiogram revealed large mobile left ventricular thrombus on the left and right ventricular apical thrombus in the right, ejection fraction less than 20%.   Cardiology was consulted recommended DOAC. PT evaluated patient and recommended skilled nursing facility placement.  Patient was found to lack medical decision capacity/status and not amenable to determining his discharge disposition or leave AGAINST MEDICAL ADVICE.  TOC is pursuing guardianship with APS as he is lacking medical decision capacity. He did complain of halluacinations, eating poor, refused medications, has insomnia.   Subjective: Patient was seen and examined this morning. Sitting up in wheelchair.  Not in distress.  Bilateral lower leg amputation status.   Psychiatry follow-up appreciated.   Assessment and plan: Acute CVA (cerebrovascular accident)  He underwent extensive workup and imaging revealed left MCA territory infarct, involving caudate nucleus, lentiform nucleus and internal capsule confirmed on MRI. TEE was noted for large mobile left lower clot just under the mitral valve with an EF of 20% and a small right apical clot with reduced RV systolic function. Psychiatry has deemed the patient to lack capacity; he continues to remain noncompliant at times. Unable to force meds.  Continue offering throughout the day as able.   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 Last 2D echo showed an EF of less than 20% Continue Xarelto  20 mg PO daily    Schizoaffective disorder / bipolar disorder H/o polysubstance abuse Lacks medical capacity: Psych was consulted and recommended that the patient lacks medical capacity to make decisions if the patient attempts to leave AGAINST MEDICAL ADVICE he should be involuntarily committed. Psych recommending APS guardianship, TOC in touch with APS regarding this as patient lacks medical capacity. Psych meds per psychiatry.  Currently on Latuda , gabapentin    Essential hypertension: Continue Coreg  CR 24 hr 10 mg PO daily Lasix  40 mg daily Lisinopril  40 mg PO daily TAVR Aldactone  25 mg PO daily  Hydralazine  25 mg PO q6 hr PRN  IV labetalol  20 mg q3 hr PRN    Hyperlipidemia Continue Crestor  5 mg PO at bedtime.   Diabetes mellitus type 2 last A1c of 5.4. refuses monitoring  Ensure enlive PO bid  Gabapentin  200 mg PO bid    Polysubstance abuse Ativan  1 mg PO q6 hr PRN  Latuda  40 mg PO daily  Oxycodone  10 mg PO q3 hr PRN    Deconditioning: Ambulatory status complicated by previous right AKA and left BKA,  has no insurance coverage for SNF. refusing therapy at times. Meclizine  25 mg PO tid PRN    H/o COPD: Albuterol  q6 hr PRN nebs   H/o DVT: Xarelto  20 mg PO daily    Mobility:  PT Orders: Active   PT Follow up Rec: Home Health Pt (Followed By Outpt Pt For Continuing Prosthesis Training; [name] Manage Safely In An Accessible Living Space With Assist/Supervision For Medications, Meals)07/18/2024 1400   Goals of care   Code Status: Full Code  DVT prophylaxis:   rivaroxaban  (XARELTO ) tablet 20 mg   Antimicrobials: None currently Fluid: None Consultants: Psychiatry Family Communication: None at bedside  Status: Inpatient Level of care:  Med-Surg   Patient is from: Homeless Anticipated d/c to: Difficulty disposition   Diet:  Diet Order              Diet regular Fluid consistency: Thin  Diet effective now           Diet - low sodium heart healthy                   Scheduled Meds:  carvedilol   10 mg Oral Daily   furosemide   40 mg Oral Daily   gabapentin   200 mg Oral BID   lisinopril   40 mg Oral Daily   lurasidone   40 mg Oral Q breakfast   rivaroxaban   20 mg Oral QHS   rosuvastatin   5 mg Oral QHS   spironolactone   25 mg Oral Daily    PRN meds: albuterol , feeding supplement, hydrALAZINE , labetalol , lactulose , LORazepam , meclizine , mouth rinse, oxyCODONE    Infusions:    Antimicrobials: Anti-infectives (From admission, onward)    None       Objective: Vitals:   07/21/24 1705 07/21/24 2009  BP: (!) 130/90 130/79  Pulse: 86 76  Resp: 19   SpO2: 99% 100%    Intake/Output Summary (Last 24 hours) at 07/22/2024 1606 Last data filed at 07/22/2024 1030 Gross per 24 hour  Intake 240 ml  Output --  Net 240 ml   Filed Weights   03/26/24 0431 03/28/24 0456 04/04/24 0500  Weight: 42.7 kg 47.5 kg 61.7 kg   Weight change:  Body mass index is 20.09 kg/m.   Physical Exam: General exam: Pleasant, calm, comfortable Skin: No rashes, lesions or ulcers. HEENT: Atraumatic, normocephalic, no obvious bleeding Lungs: Clear to auscultation bilaterally,  CVS: S1, S2, no murmur,   GI/Abd: Soft, nontender, nondistended, bowel sound present,   CNS: Alert, awake, Psychiatry: Mood appropriate Extremities: Right AKA, left BKA status  Data Review: I have personally reviewed the laboratory data and studies available.  F/u labs  Unresulted Labs (From admission, onward)    None      Signed, Chapman Rota, MD [facility] 07/22/2024

## 2024-07-22 NOTE — Progress Notes (Signed)
 Speech Language Pathology Treatment: Cognitive-Linguistic  Patient Details Name: [name] MRN: [id number] DOB: Jun 01, 1966 Today's Date: 07/22/2024 Time: 1000-1023 SLP Time Calculation (min) (ACUTE ONLY): 23 min  Assessment / Plan / Recommendation Clinical Impression  Pt participated in money management task this morning. He required at least Mod cues for self-monitoring, problem solving, and working memory. This task appeared to be a little more challenging for him compared to other tasks we have worked on in therapy recently, and he agreed. Note that pt also had a few interruptions during our session, requiring alternating attention, which is not something we had started working on yet. SLP provided Mod cues to help him get back to where he had left off. Will continue to follow.    HPI HPI: Patient is 58 y.o. male presented to ED on 03/04/24 with stroke like symptoms when caregiver found him with slurred speech and right sided weakness. Head CT reveals Acute left MCA territory infarct affecting the insula, caudate nucleus, lentiform nucleus and internal capsule. Pt initially evaluated by SLP 4/5 but s/o 4/22 due to repeated refusals. SLP reordered 5/13 for repeat cognitive-linguistic eval.   PMH significant of  Schizoaffective disorder, heart failure with reduced ejection fraction, pretension, DMII, COPD, s/p bilateral amputations, chronic pain, and polysubstance abuse.      SLP Plan  Continue with current plan of care          Recommendations                         Intermittent Supervision/Assistance (assist wtih meds/finances) Cognitive communication deficit (M58.158)     Continue with current plan of care     Leita SAILOR., M.A. CCC-SLP Acute Rehabilitation Services Office: [phone]  Secure chat preferred   07/22/2024, 10:55 AM

## 2024-07-22 NOTE — Consult Note (Signed)
 [facility] Follow-up  Patient Name: [name]  MRN: [id number]  DOB: 1966/10/12  Consult Order details:  Orders (From admission, onward)     Start     Ordered   06/28/24 1642  IP CONSULT TO PSYCHIATRY       Ordering Provider: Elpidio Reyes DEL, MD  Provider:  (Not yet assigned)  Question Answer Comment  Location [facility]   Reason for Consult? Question about improved capacity now that patient has been receiving treatment in house.  Patient's attorney requested repeat psych consult for possible discharge planning      06/28/24 1642   04/05/24 1343  IP CONSULT TO PSYCHIATRY       Ordering Provider: Judeth Trenda BIRCH, MD  Provider:  (Not yet assigned)  Question Answer Comment  Location [facility]   Reason for Consult? Acute stroke, embolic from large intracardiac clots, refusing Eliquis .  Schizoaffective d/o, lacks MDM capacity, has consistently refused to take Latuda .  ?  parenteral/IM, possibly long-acting [facility] medications.      04/05/24 1346   03/30/24 1323  IP CONSULT TO PSYCHIATRY       Comments: 47M, lived alone, B/L amputee, wheelchair, P/W acute left MCA stroke, large cardiac thrombus, through course of hospital admission with issues of agitation, refusal of care, refusing SNF, felt unsafe to DC home and documentation regarding lack of capacity (however did not specifically see this addressed in multiple psychiatry evaluations), currently medically stable awaiting disposition. APS contacted.  Kindly refer to [facility] MDs note from 4/30 regarding need for capacity evaluation.  Ordering Provider: Judeth Trenda BIRCH, MD  Provider:  (Not yet assigned)  Question Answer Comment  Location [facility]   Reason for Consult? Kindly assist with capacity evaluation> details in comments.      03/30/24 1323   03/14/24 1515  IP CONSULT TO PSYCHIATRY       Ordering Provider: Elpidio Reyes DEL, MD  Provider:  (Not yet  assigned)  Question Answer Comment  Location [facility]   Reason for Consult? Capacity evaluation -- patient verbally abusive, wants to go home, but oriented only to self.  Lives alone, seems to be unable to care for himself at home.  Essentially refusing all cares here      03/14/24 1514             Mode of Visit: In person    Psychiatry Consult Evaluation  Service Date: July 22, 2024 LOS:  LOS: 140 days  Chief Complaint I have been hearing voices telling me to kill myself.   Primary Psychiatric Diagnoses  Hx Schizoaffective disorder unspecified versus hx Substance induced psychosis/mood disorder versus hx MDD 2.  Stimulant use disorder (cocaine), in controlled environment  Assessment  [name] is a 58 y.o. male admitted: Medically on 03/04/2024  2:31 PM for residual stroke with prolonged hospitalization due to inability to care for self. 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   Seen numerous times by psychiatry at this point. Case summary and conceptualization:  Middle age male with significant substance use issues (possible schizoaffective disorder), apparent medication non-compliance, and severe medical problems (HF w EF < 20 and repeat CVA's with large current LA thrombus). Psychiatry consulted recently for capacity eval.  Patient demonstrated fair cognition during the eval but it was decided that since the patient has been appointed a guardian ad litem and has an upcoming competency hearing, he can not be determined to have capacity to leave the hospital (at least at that time on 7/31). Psychiatry signed off.   Psychiatry consulted again on 8/3 to address medication non-compliance. There was concern  noted for psychotic symptoms. Started on Latuda  40 mg daily by Dr. Sable covering the consult service.    07/04/24:  Rapid response called on pt yesterday for sudden slumping over with minimal responsiveness without noted shaking. BP was 63/51, fluids, CT head and cardiac monitoring ordered. Pt was fully alert within the next 2 hours, and refused CT. EKG was also refused this morning. Pt may not be eating all of his food, possibly interfering with the absorption of Latuda  which requires 350cal with intake for proper absorption. Pt presented this morning alert and oriented and did not show any overt signs of psychosis. Denies SI, HI, A&VH. Will not change medications at this time. Will follow up tomorrow and read nursing notes for further signs of paranoia.  8/5: Patient pleasant and appropriate today. He denies having had any perceptual disturbances since starting Latuda . He asks several questions about his medical care and guardianship, which I answered to the best of my ability. I deferred much of that discussion to the primary medical team. I did ask about his refusal of medication yesterday and he says there was a misunderstanding. He says he wants to take his medications each day. He appears to be doing well on Latuda . We will continue to follow the patient via chart review with in person visits approximately weekly.   8/14: Weekly check in. Irritable. Denies psychosis symptoms. No complaints. Will continue Latuda .  8/22 Patient seen sitting up in bed speaking with speech therapy on my approach. He is slightly irritable but denies any symptoms of psychosis or issues with his treatment plan.  Diagnoses:  Active Hospital problems: Principal Problem:   Acute CVA (cerebrovascular accident) [facility]) Active Problems:   Schizoaffective disorder, bipolar type (HCC)   Cocaine abuse (HCC)   Type 2 diabetes mellitus (HCC)   Prolonged QT interval   Acid reflux   History of DVT (deep vein  thrombosis)   Status post bilateral BKA.   COPD (chronic obstructive pulmonary disease) (HCC)   Chronic systolic CHF (congestive heart failure) (HCC)   Benign essential HTN   LV (left ventricular) mural thrombus   Elevated liver enzymes   Dilated cardiomyopathy (HCC)   Impaired decision making    Plan   ## Psychiatric Medication Recommendations:  --Continue Latuda  40 mg every morning with breakfast for psychosis-with plan to up titrated as needed. --Continue Gabapentin  200 mg twice daily   ## Medical Decision Making Capacity: will not make an assessment on capacity in the setting of having pending competency evaluation by court, scheduled for Sept.   ## Further Work-up:  -- Latuda  has very low propensity to prolong QT -- given long QT -> optimize K 4.0, Mg 2.0, and benefit-risk with all QT prolonging meds -- most recent EKG on 06/29/2024 had QtC of 511  -- Pertinent labwork reviewed earlier this admission   ## Disposition:-- There are no psychiatric contraindications to discharge at this time. Discharge is pending competency evaluation or other legally appropriate means of discharge. Please contact ethics team if discharge is planned prior to competency evaluation.   ## Behavioral / Environmental: - No specific recommendations  at this time.     ## Safety and Observation Level:  - Based on my clinical evaluation, I estimate the patient to be at low risk of self harm in the current setting. - At this time, we recommend  routine. This decision is based on my review of the chart including patient's history and current presentation, interview of the patient, mental status examination, and consideration of suicide risk including evaluating suicidal ideation, plan, intent, suicidal or self-harm behaviors, risk factors, and protective factors. This judgment is based on our ability to directly address suicide risk, implement suicide prevention strategies, and develop a safety plan while the  patient is in the clinical setting. Please contact our team if there is a concern that risk level has changed.  CSSR Risk Category:C-SSRS RISK CATEGORY: No Risk  Suicide Risk Assessment: Patient has following modifiable risk factors for suicide: medication noncompliance, which we are addressing by competency evaluation to get pt guardian and access to resources. Patient has following non-modifiable or demographic risk factors for suicide: male gender, history of suicide attempt, and psychiatric hospitalization Patient has the following protective factors against suicide: in controlled environment and not currently experiencing any acute psychiatric concerns.   Thank you for this consult request. Recommendations have been communicated to the primary team.  We will continue to follow up for now.     History of Present Illness  Relevant Aspects of [facility] Course:  Admitted on 03/04/2024 for strokelike symptoms.  Patient was diagnosed with left MCA territory stroke which involve numerous structures.  Patient had residual deficits including right-sided weakness and cognitive impairment. Echo showing cardiac clot.  Patient Report:   From capacity assessment on 7/31: When asking patient what he would do if he were to hypothetically leave the hospital today, he reports he does not know.  When asking him where he would stay, he report that he knows people but on further questioning reports that he does not know and says that he would talk to [facility].  Patient reports that he had given his key to [name], nurse with ACT team, and currently is unaware if he has access to his apartment still.  Patient reports that he is followed by [facility] and they frequently follow him to check on his mental health and other aspects of his health care.  Asked patient if he will continue to use cocaine after discharge and he adamantly reports no.  Asked patient what he would do if he had any medical  concerns, and he reported he would call out for help.  Following this discussion, patient reports that he could stay at a halfway house and therapy people there for him to get help from if he has any medical concerns.  Talked to patient about ADLs, and he reports that he is able to feed himself, use the bathroom by himself, and shower himself.  He then reports I cannot do everything myself.  When discussing who has helped in the past patient reports [name], [facility].  Asked patient if he had any family members or friends that he speaks with, and he reports he does not.  Asked patient if he could make the decision of leaving today and taking care of himself or waiting in the hospital until court evaluation and getting resources to help him function including group home, and he is unable to provide a meaningful answer for this.  Psych ROS:  Depression: Does not describe depressive symptoms.  Denies suicidal thoughts. Anxiety:  Did not describe significant anxiety symptoms. Mania (lifetime and current): Does not describe recent manic symptoms. Psychosis: (lifetime and current): Endorses some symptoms of psychosis outside of cocaine use in the past but denies any symptoms psychosis since hospitalization in April and being off of secondary medications and not using cocaine.  Collateral information:  [name], guardian ad litum, [phone]: Call and left HIPAA compliant voicemail.   Psychiatric and Social History  Psychiatric History:  Information collected from patient and chart  Prev Dx/Sx: Schizoaffective disorder Current Psych Provider: ACT team Home Meds (current): Abilify  5 mg twice a day and hydroxyzine  25 mg every 6 hours as needed Previous Med Trials: Multiple trials Therapy: Currently followed up ACT team   Prior Psych Hospitalization: Last hospitalization was at [facility] in October 2024 Prior Self Harm: History of suicidal behavior Prior Violence: History of agitation  and carrying knife/scapel   Family Psych History: Unknown Family Hx suicide: Unknown  Social History:  Not discussed during this encounter.  Not much information per chart review.  Patient did appear to live at her apartment alone prior to current admission.  Patient is followed by ACT team. Access to weapons/lethal means: Denies  Substance History Cocaine use disorder and hx of rehab. Also history of alc use but w/o complications. No other pertinent substance hx.   Exam Findings  Physical Exam:  Vital Signs:  Pulse Rate:  [76-86] 76 (08/21 2009) Resp:  [18-19] 19 (08/21 1705) BP: (130-133)/(78-90) 130/79 (08/21 2009) SpO2:  [99 %-100 %] 100 % (08/21 2009) Blood pressure 130/79, pulse 76, temperature 98.4 F (36.9 C), temperature source Oral, resp. rate 19, height 5' 9 (1.753 m), weight 61.7 kg, SpO2 100%. Body mass index is 20.09 kg/m.  Physical Exam Vitals and nursing note reviewed.  HENT:     Head: Normocephalic and atraumatic.  Pulmonary:     Effort: Pulmonary effort is normal.  Musculoskeletal:     Comments: Bilateral lower extremity amputations.  Neurological:     General: No focal deficit present.     Mental Status: He is alert.     Mental Status Exam: General Appearance: Casual and Fairly Groomed.   Orientation:  Full (Time, Place, and Person)  Memory:  Recent;   Good Remote;   Fair  Concentration:  Concentration: Good  Recall:  Good  Attention  Fair  Eye Contact:  Good  Speech:  nml  Language:  Fair  Volume:  Normal  Mood: okay  Affect:  Constricted.   Thought Process:  logical  Thought Content: nml  Suicidal Thoughts:  No  Homicidal Thoughts:  No  Judgement:  Fair  Insight:  Fair  Psychomotor Activity:  Normal  Akathisia:  Negative  Fund of Knowledge:  Fair, lower end of normal. Unclear if chronic or since his CVA.       Assets:  Communication Skills Desire for Improvement Financial Resources/Insurance Leisure Time  Cognition: On brief  assessment intact executive functioning.  ADL's:  Intact, subjective per patient; SNF per OT  AIMS (if indicated):        Other History   These have been pulled in through the EMR, reviewed, and updated if appropriate.  Family History:  The patient's family history includes Drug abuse in his mother; Suicidality in his mother.  Medical History: Past Medical History:  Diagnosis Date   Asthma    Bipolar 1 disorder (HCC)    CHF (congestive heart failure) (HCC)    COPD (chronic obstructive pulmonary disease) (HCC)  Depression    Diabetes mellitus without complication (HCC)    GERD (gastroesophageal reflux disease)    Homelessness    Hypertension    QT prolongation    Schizoaffective disorder [facility])     Surgical History: Past Surgical History:  Procedure Laterality Date   AMPUTATION Left 12/10/2022   Procedure: LEFT BELOW KNEE AMPUTATION;  Surgeon: Harden Jerona GAILS, MD;  Location: [facility] OR;  Service: Orthopedics;  Laterality: Left;   AMPUTATION TOE Bilateral 01/02/2021   Procedure: AMPUTATION TOE;  Surgeon: Gretel Ozell PARAS, DPM;  Location: [facility] OR;  Service: Podiatry;  Laterality: Bilateral;  Ok to leave patient on hospital bed   IR ANGIO INTRA EXTRACRAN SEL COM CAROTID INNOMINATE BILAT MOD SED  12/17/2020   IR ANGIO VERTEBRAL SEL VERTEBRAL BILAT MOD SED  12/17/2020   MULTIPLE EXTRACTIONS WITH ALVEOLOPLASTY N/A 03/25/2023   Procedure: IRRIGATION AND DEBRIDEMENT, MULTIPLE EXTRACTION WITH ALVEOLOPLASTY X 4 , EXTRACTIONS 4 THROUGH 13, 20 THROUGHT 29;  Surgeon: Joanette Soulier, DMD;  Location: [facility] OR;  Service: Oral Surgery;  Laterality: N/A;   RADIOLOGY WITH ANESTHESIA N/A 12/17/2020   Procedure: IR WITH ANESTHESIA;  Surgeon: Radiologist, Medication, MD;  Location: [facility] OR;  Service: Radiology;  Laterality: N/A;     Medications:   Current Facility-Administered Medications:    albuterol  (PROVENTIL ) (2.5 MG/3ML) 0.083% nebulizer solution 2.5 mg, 2.5 mg, Nebulization, Q6H PRN, Danton Reyes DASEN, MD,  2.5 mg at 06/05/24 0840   carvedilol  (COREG  CR) 24 hr capsule 10 mg, 10 mg, Oral, Daily, Lenon Marien CROME, MD, 10 mg at 07/22/24 1021   feeding supplement (ENSURE ENLIVE / ENSURE PLUS) liquid 237 mL, 237 mL, Oral, BID BM PRN, Danton Reyes DASEN, MD   furosemide  (LASIX ) tablet 40 mg, 40 mg, Oral, Daily, [place], Hao, [place], 40 mg at 07/22/24 1021   gabapentin  (NEURONTIN ) capsule 200 mg, 200 mg, Oral, BID, Lord, Jamison Y, NP, 200 mg at 07/22/24 1021   hydrALAZINE  (APRESOLINE ) tablet 25 mg, 25 mg, Oral, Q6H PRN, Sundil, Subrina, MD, 25 mg at 07/07/24 0044   labetalol  (NORMODYNE ) injection 20 mg, 20 mg, Intravenous, Q3H PRN, Mansy, Jan A, MD, 20 mg at 05/24/24 2250   lactulose  (CHRONULAC ) 10 GM/15ML solution 20 g, 20 g, Oral, BID PRN, Akula, Vijaya, MD   lisinopril  (ZESTRIL ) tablet 40 mg, 40 mg, Oral, Daily, Odell Celinda Balo, MD, 40 mg at 07/22/24 1021   LORazepam  (ATIVAN ) tablet 1 mg, 1 mg, Oral, Q6H PRN, Sreeram, Narendranath, MD   lurasidone  (LATUDA ) tablet 40 mg, 40 mg, Oral, Q breakfast, Akintayo, Musa A, MD, 40 mg at 07/22/24 1021   meclizine  (ANTIVERT ) tablet 25 mg, 25 mg, Oral, TID PRN, Sreeram, Narendranath, MD, 25 mg at 07/21/24 1734   Oral care mouth rinse, 15 mL, Mouth Rinse, PRN, Regalado, Belkys A, MD   oxyCODONE  (Oxy IR/ROXICODONE ) immediate release tablet 10 mg, 10 mg, Oral, Q3H PRN, Leotis, Pardeep, MD, 10 mg at 07/21/24 2225   rivaroxaban  (XARELTO ) tablet 20 mg, 20 mg, Oral, QHS, Ogbata, Sylvester I, MD, 20 mg at 07/21/24 2111   rosuvastatin  (CRESTOR ) tablet 5 mg, 5 mg, Oral, QHS, Desai, Nikita S, MD, 5 mg at 07/21/24 2110   spironolactone  (ALDACTONE ) tablet 25 mg, 25 mg, Oral, Daily, Pahwani, Ravi, MD, 25 mg at 07/22/24 1021  Allergies: Allergies  Allergen Reactions   Acetaminophen  Hives, Nausea And Vomiting, Rash and Anaphylaxis    PT states that if he takes this he is going to the hospital   Motrin  [Ibuprofen ] Anaphylaxis  Nsaids Anaphylaxis    PT states that if he takes  this he is going to the hospital   Lyrica  Cr [Pregabalin  Er] Other (See Comments)    Dystonia Dysarthria    Peanut-Containing Drug Products Other (See Comments)    Pt denies allergy to peanut butter 03/02/2021

## 2024-07-23 DIAGNOSIS — I639 Cerebral infarction, unspecified: Secondary | ICD-10-CM | POA: Diagnosis not present

## 2024-07-23 NOTE — Progress Notes (Signed)
 PROGRESS NOTE  [name]  DOB: 1966/07/13  PCP: Tanda Bleacher, MD FMW:[id number]  DOA: 03/04/2024  LOS: 141 days  Hospital Day: 142  Brief narrative: [name] is a 58 y.o. male with PMH significant for homelessness, DM2, HTN, CHF, bipolar disorder, schizoaffective disorder, cocaine abuse, COPD Patient is a to the ED 03/04/24 for slower speech, confusion and right-sided weakness CT head showed left MCA infarct CTA head and neck revealed an M2 occlusion on the left,   Stroke workup was completed Echocardiogram revealed large mobile left ventricular thrombus on the left and right ventricular apical thrombus in the right, ejection fraction less than 20%.   Cardiology was consulted recommended DOAC. PT evaluated patient and recommended skilled nursing facility placement.  Patient was found to lack medical decision capacity/status and not amenable to determining his discharge disposition or leave AGAINST MEDICAL ADVICE.  TOC is pursuing guardianship with APS as he is lacking medical decision capacity. He did complain of halluacinations, eating poor, refused medications, has insomnia.   Subjective: Patient was seen and examined this morning. Sitting up in wheelchair.  Not in distress.  No essential change in the last several days Seen by psychiatry and speech therapist yesterday.  Assessment and plan: Acute CVA (cerebrovascular accident)  He underwent extensive workup and imaging revealed left MCA territory infarct, involving caudate nucleus, lentiform nucleus and internal capsule confirmed on MRI. TEE was noted for large mobile left lower clot just under the mitral valve with an EF of 20% and a small right apical clot with reduced RV systolic function. Psychiatry has deemed the patient to lack capacity; he continues to remain noncompliant at times. Unable to force meds.  Continue offering throughout the day as able.   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 Last 2D echo showed an EF of less than 20% Continue Xarelto  20 mg PO daily    Schizoaffective disorder / bipolar disorder H/o polysubstance abuse Lacks medical capacity: Psych was consulted and recommended that the patient lacks medical capacity to make decisions if the patient attempts to leave AGAINST MEDICAL ADVICE he should be involuntarily committed. Psych recommending APS guardianship, TOC in touch with APS regarding this as patient lacks medical capacity. Psych meds per psychiatry.  Currently on Latuda , gabapentin    Essential hypertension: Continue Coreg  CR 24 hr 10 mg PO daily Lasix  40 mg daily Lisinopril  40 mg PO daily TAVR Aldactone  25 mg PO daily  Hydralazine  25 mg PO q6 hr PRN  IV labetalol  20 mg q3 hr PRN    Hyperlipidemia Continue Crestor  5 mg PO at bedtime.   Diabetes mellitus type 2 last A1c of 5.4. refuses monitoring  Ensure enlive PO bid  Gabapentin  200 mg PO bid    Polysubstance abuse Ativan  1 mg PO q6 hr PRN  Latuda  40 mg PO daily  Oxycodone  10 mg PO q3 hr PRN    Deconditioning: Ambulatory status complicated by previous right AKA and left BKA,  has no insurance coverage for SNF. refusing therapy at times. Meclizine  25 mg PO tid PRN    H/o COPD: Albuterol  q6 hr PRN nebs   H/o DVT: Xarelto  20 mg PO daily    Mobility:  PT Orders: Active   PT Follow up Rec: Home Health Pt (Followed By Outpt Pt For Continuing Prosthesis Training; [name] Manage Safely In An Accessible Living Space With Assist/Supervision For Medications, Meals)07/18/2024 1400   Goals of care   Code Status: Full  Code     DVT prophylaxis:   rivaroxaban  (XARELTO ) tablet 20 mg   Antimicrobials: None currently Fluid: None Consultants: Psychiatry Family Communication: None at bedside  Status: Inpatient Level of care:  Med-Surg   Patient is from: Homeless Anticipated d/c to: Difficulty disposition   Diet:  Diet  Order             Diet regular Fluid consistency: Thin  Diet effective now           Diet - low sodium heart healthy                   Scheduled Meds:  carvedilol   10 mg Oral Daily   furosemide   40 mg Oral Daily   gabapentin   200 mg Oral BID   lisinopril   40 mg Oral Daily   lurasidone   40 mg Oral Q breakfast   rivaroxaban   20 mg Oral QHS   rosuvastatin   5 mg Oral QHS   spironolactone   25 mg Oral Daily    PRN meds: albuterol , feeding supplement, hydrALAZINE , labetalol , lactulose , LORazepam , meclizine , mouth rinse, oxyCODONE    Infusions:    Antimicrobials: Anti-infectives (From admission, onward)    None       Objective: Vitals:   07/22/24 2053 07/23/24 0800  BP: (!) 143/60 137/72  Pulse: 77 63  Resp: 18 20  SpO2: 100% 100%   No intake or output data in the 24 hours ending 07/23/24 1128  Filed Weights   03/26/24 0431 03/28/24 0456 04/04/24 0500  Weight: 42.7 kg 47.5 kg 61.7 kg   Weight change:  Body mass index is 20.09 kg/m.   Physical Exam: General exam: Pleasant, calm, comfortable Skin: No rashes, lesions or ulcers. HEENT: Atraumatic, normocephalic, no obvious bleeding Lungs: Clear to auscultation bilaterally,  CVS: S1, S2, no murmur,   GI/Abd: Soft, nontender, nondistended, bowel sound present,   CNS: Alert, awake, Psychiatry: Mood appropriate Extremities: Right AKA, left BKA status  Data Review: I have personally reviewed the laboratory data and studies available.  F/u labs  Unresulted Labs (From admission, onward)    None      Signed, Chapman Rota, MD [facility] 07/23/2024

## 2024-07-24 DIAGNOSIS — I639 Cerebral infarction, unspecified: Secondary | ICD-10-CM | POA: Diagnosis not present

## 2024-07-24 NOTE — Progress Notes (Signed)
 PROGRESS NOTE  [name]  DOB: 05-03-1966  PCP: Tanda Bleacher, MD FMW:[id number]  DOA: 03/04/2024  LOS: 142 days  Hospital Day: 143  Brief narrative: [name] is a 58 y.o. male with PMH significant for homelessness, DM2, HTN, CHF, bipolar disorder, schizoaffective disorder, cocaine abuse, COPD Patient is a to the ED 03/04/24 for slower speech, confusion and right-sided weakness CT head showed left MCA infarct CTA head and neck revealed an M2 occlusion on the left,   Stroke workup was completed Echocardiogram revealed large mobile left ventricular thrombus on the left and right ventricular apical thrombus in the right, ejection fraction less than 20%.   Cardiology was consulted recommended DOAC. PT evaluated patient and recommended skilled nursing facility placement.  Patient was found to lack medical decision capacity/status and not amenable to determining his discharge disposition or leave AGAINST MEDICAL ADVICE.  TOC is pursuing guardianship with APS as he is lacking medical decision capacity. He did complain of halluacinations, eating poor, refused medications, has insomnia.   Subjective: Patient was seen and examined this morning. On wheelchair, wandering around on the hallway. Not in distress.  No new change in last several days. Hemodynamically stable  Assessment and plan: Acute CVA (cerebrovascular accident)  He underwent extensive workup and imaging revealed left MCA territory infarct, involving caudate nucleus, lentiform nucleus and internal capsule confirmed on MRI. TEE was noted for large mobile left lower clot just under the mitral valve with an EF of 20% and a small right apical clot with reduced RV systolic function. Psychiatry has deemed the patient to lack capacity; he continues to remain noncompliant at times. Unable to force meds.  Continue offering throughout the day as able.   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 Last 2D echo showed an EF of less than 20% Continue Xarelto  20 mg PO daily    Schizoaffective disorder / bipolar disorder H/o polysubstance abuse Lacks medical capacity: Psych was consulted and recommended that the patient lacks medical capacity to make decisions if the patient attempts to leave AGAINST MEDICAL ADVICE he should be involuntarily committed. Psych recommending APS guardianship, TOC in touch with APS regarding this as patient lacks medical capacity. Psych meds per psychiatry.  Currently on Latuda , gabapentin    Essential hypertension: Continue Coreg  CR 24 hr 10 mg PO daily Lasix  40 mg daily Lisinopril  40 mg PO daily TAVR Aldactone  25 mg PO daily  Hydralazine  25 mg PO q6 hr PRN  IV labetalol  20 mg q3 hr PRN    Hyperlipidemia Continue Crestor  5 mg PO at bedtime.   Diabetes mellitus type 2 last A1c of 5.4. refuses monitoring  Ensure enlive PO bid  Gabapentin  200 mg PO bid    Polysubstance abuse Ativan  1 mg PO q6 hr PRN  Latuda  40 mg PO daily  Oxycodone  10 mg PO q3 hr PRN    Deconditioning: Ambulatory status complicated by previous right AKA and left BKA,  has no insurance coverage for SNF. refusing therapy at times. Meclizine  25 mg PO tid PRN    H/o COPD: Albuterol  q6 hr PRN nebs   H/o DVT: Xarelto  20 mg PO daily    Mobility:  PT Orders: Active   PT Follow up Rec: Home Health Pt (Followed By Outpt Pt For Continuing Prosthesis Training; [name] Manage Safely In An Accessible Living Space With Assist/Supervision For Medications, Meals)07/18/2024 1400   Goals of care   Code Status: Full Code  DVT prophylaxis:   rivaroxaban  (XARELTO ) tablet 20 mg   Antimicrobials: None currently Fluid: None Consultants: Psychiatry Family Communication: None at bedside  Status: Inpatient Level of care:  Med-Surg   Patient is from: Homeless Anticipated d/c to: Difficulty disposition   Diet:  Diet Order              Diet regular Fluid consistency: Thin  Diet effective now           Diet - low sodium heart healthy                   Scheduled Meds:  carvedilol   10 mg Oral Daily   furosemide   40 mg Oral Daily   gabapentin   200 mg Oral BID   lisinopril   40 mg Oral Daily   lurasidone   40 mg Oral Q breakfast   rivaroxaban   20 mg Oral QHS   rosuvastatin   5 mg Oral QHS   spironolactone   25 mg Oral Daily    PRN meds: albuterol , feeding supplement, hydrALAZINE , labetalol , lactulose , LORazepam , meclizine , mouth rinse, oxyCODONE    Infusions:    Antimicrobials: Anti-infectives (From admission, onward)    None       Objective: Vitals:   07/23/24 1956 07/24/24 1000  BP: 132/86 125/75  Pulse: 68 67  Resp: 16   SpO2: 100% 99%    Intake/Output Summary (Last 24 hours) at 07/24/2024 1530 Last data filed at 07/24/2024 1100 Gross per 24 hour  Intake --  Output 500 ml  Net -500 ml    Filed Weights   03/26/24 0431 03/28/24 0456 04/04/24 0500  Weight: 42.7 kg 47.5 kg 61.7 kg   Weight change:  Body mass index is 20.09 kg/m.   Physical Exam: General exam: Pleasant, calm, comfortable Skin: No rashes, lesions or ulcers. HEENT: Atraumatic, normocephalic, no obvious bleeding Lungs: Clear to auscultation bilaterally,  CVS: S1, S2, no murmur,   GI/Abd: Soft, nontender, nondistended, bowel sound present,   CNS: Alert, awake, Psychiatry: Mood appropriate Extremities: Right AKA, left BKA status  Data Review: I have personally reviewed the laboratory data and studies available.  F/u labs  Unresulted Labs (From admission, onward)    None      Signed, Chapman Rota, MD [facility] 07/24/2024

## 2024-07-25 DIAGNOSIS — I639 Cerebral infarction, unspecified: Secondary | ICD-10-CM | POA: Diagnosis not present

## 2024-07-25 NOTE — Progress Notes (Signed)
 Physical Therapy Note  Original plan was to meet with Prosthetist at noon for another AKA socket fit check, however Prosthetist had to reschedule for this Wednesday; This PT met with Sacha to inform him of the schedule change, and check in with him to make sure we are working towards his goals;  He requested that this PT meet/communicate with him directly, instead of speaking with his bedside nurse; doing this can be less efficient, but I will make every effort;  Rahmir indicated that he will do what I (he) have (has) to do re: getting the AKA prosthesis and walking;  Briefly discussed how much more complicated gait training is for an AKA prosthesis where he must learn knee control, too;  Initial prosthesis training is done in the Outpt PT setting, and it is very rarely done in the acute hospital setting;  In keeping safety paramount, will need 2 person assist for safety; I have connected with Rickie, COTA, to join us  so Kohner can work with people he is familiar with -- he has voiced in the past that he does not want PT and OT at the same time, but this PT will have to keep safety in mind; will try to give Antwain choices as to who joins us  for his PT prosthesis/initial gait training sessions;  [name], PT  Acute Rehabilitation Services Office [phone] Secure Chat welcomed    07/25/24 1800  PT Time Calculation  PT Start Time (ACUTE ONLY) 1215  PT Stop Time (ACUTE ONLY) 1230  PT Time Calculation (min) (ACUTE ONLY) 15 min  PT General Charges  $$ ACUTE PT VISIT 1 Visit (No Charge)

## 2024-07-25 NOTE — Progress Notes (Signed)
 Occupational Therapy Treatment Patient Details Name: [name] MRN: [id number] DOB: [birth year] Today's Date: 07/25/2024   History of present illness Patient is 58 y.o. male presented to ED on 03/04/24 with stroke like symptoms when caregiver found him with slurred speech and right sided weakness. Head CT reveals Acute left MCA territory infarct affecting the insula, caudate nucleus, lentiform nucleus and internal capsule. PMH significant of  Schizoaffective disorder, heart failure with reduced ejection fraction, pretension, DMII, COPD, s/p bilateral amputations, chronic pain, and polysubstance abuse.   OT comments  Patient continues to require supervision with self care tasks and functional transfers. Patient requiring fewer cues for safety involving locking brakes and self with wheelchair for transfers. Discharge recommendations continue to be appropriate.  Acute OT to continue to follow to address established goals to facilitate DC to next venue of care.        If plan is discharge home, recommend the following:  A little help with walking and/or transfers;A little help with bathing/dressing/bathroom;Supervision due to cognitive status;Direct supervision/assist for financial management;Direct supervision/assist for medications management   Equipment Recommendations  None recommended by OT    Recommendations for Other Services      Precautions / Restrictions Precautions Precautions: Fall Recall of Precautions/Restrictions: Impaired Precaution/Restrictions Comments: h/o BLE amputations Required Braces or Orthoses: Other Brace Other Brace: L prosthesis; and R prosthesis with Prosthetist present to chick initial socket fit Restrictions Weight Bearing Restrictions Per Provider Order: No       Mobility Bed Mobility Overal bed mobility: Modified Independent             General bed mobility comments: OOB in wheelchair    Transfers Overall transfer level: Needs  assistance Equipment used: None Transfers: Bed to chair/wheelchair/BSC            Lateral/Scoot Transfers: Supervision General transfer comment: supervision for safety     Balance Overall balance assessment: Mild deficits observed, not formally tested                                         ADL either performed or assessed with clinical judgement   ADL Overall ADL's : Needs assistance/impaired         Upper Body Bathing: Supervision/ safety;Sitting Upper Body Bathing Details (indicate cue type and reason): in shower Lower Body Bathing: Supervison/ safety;Sitting/lateral leans Lower Body Bathing Details (indicate cue type and reason): in shower Upper Body Dressing : Set up;Sitting Upper Body Dressing Details (indicate cue type and reason): donned gown Lower Body Dressing: Minimal assistance;Sitting/lateral leans Lower Body Dressing Details (indicate cue type and reason): assistance pulling up pants while in the chair and patient using UEs to clear bottom         Tub/ Shower Transfer: Supervision/safety;Tub transfer;Tub bench Tub/Shower Transfer Details (indicate cue type and reason): supervision for safety   General ADL Comments: patient continues to be supervision for safety with functional transfers and self care. Patient states he transfers himself to toilet in bathroom    Extremity/Trunk Assessment              Vision       Perception     Praxis     Communication Communication Communication: Impaired Factors Affecting Communication: Difficulty expressing self;Reduced clarity of speech   Cognition Arousal: Alert Behavior During Therapy: Flat affect Cognition: Cognition impaired     Awareness: Intellectual awareness intact, Online awareness impaired  Attention impairment (select first level of impairment): Selective attention                     Following commands: Intact Following commands impaired: Follows one step  commands inconsistently, Follows one step commands with increased time      Cueing   Cueing Techniques: Verbal cues, Visual cues  Exercises      Shoulder Instructions       General Comments      Pertinent Vitals/ Pain       Pain Assessment Pain Assessment: No/denies pain Pain Intervention(s): Monitored during session  Home Living                                          Prior Functioning/Environment              Frequency  Min 1X/week        Progress Toward Goals  OT Goals(current goals can now be found in the care plan section)  Progress towards OT goals: Progressing toward goals  Acute Rehab OT Goals Patient Stated Goal: none stated OT Goal Formulation: With patient Time For Goal Achievement: 07/28/24 Potential to Achieve Goals: Fair ADL Goals Pt Will Perform Lower Body Bathing: with mod assist;sitting/lateral leans Pt Will Perform Lower Body Dressing: with modified independence;sitting/lateral leans;bed level Pt Will Transfer to Toilet: with modified independence;bedside commode (drop arm BSC, no cues for safety) Pt Will Perform Toileting - Clothing Manipulation and hygiene: with mod assist;sitting/lateral leans Pt Will Perform Tub/Shower Transfer: Tub transfer;tub bench;with modified independence Pt/caregiver will Perform Home Exercise Program: Right Upper extremity;Independently;With written HEP provided (to increase coordination with use of RUE) Additional ADL Goal #1: Pt will transfer to w/c with Mod I as a precursor for OOB ADLs Additional ADL Goal #2: Pt will utilize RUE to gather 5/5 objects without overshooting. Additional ADL Goal #3: Pt will demonstrate proper safety with transfers/mobility 100% of the time during session. Additional ADL Goal #4: Pt will demonstrate appropriate safety awareness 100% of the time when positioning w/c to transfer  Plan      Co-evaluation                 AM-PAC OT 6 Clicks Daily Activity      Outcome Measure   Help from another person eating meals?: None Help from another person taking care of personal grooming?: A Little Help from another person toileting, which includes using toliet, bedpan, or urinal?: A Little Help from another person bathing (including washing, rinsing, drying)?: A Little Help from another person to put on and taking off regular upper body clothing?: A Little Help from another person to put on and taking off regular lower body clothing?: A Little 6 Click Score: 19    End of Session Equipment Utilized During Treatment: Other (comment) (wheelchair and tub bench)  OT Visit Diagnosis: Unsteadiness on feet (R26.81);Other abnormalities of gait and mobility (R26.89);Muscle weakness (generalized) (M62.81);Other symptoms and signs involving the nervous system (R29.898);Other symptoms and signs involving cognitive function;Hemiplegia and hemiparesis Hemiplegia - Right/Left: Right Hemiplegia - dominant/non-dominant: Dominant Hemiplegia - caused by: Cerebral infarction   Activity Tolerance Patient tolerated treatment well   Patient Left in chair;with call bell/phone within reach   Nurse Communication Mobility status        Time: 8971-8943 OT Time Calculation (min): 28 min  Charges: OT General Charges $OT Visit: 1  Visit OT Treatments $Self Care/Home Management : 23-37 mins  [name], OTA Acute Rehabilitation Services  Office [phone]   [name] 07/25/2024, 12:53 PM

## 2024-07-25 NOTE — TOC Progression Note (Signed)
 Transition of Care [facility]) - Progression Note    Patient Details  Name: [name] MRN: [id number] Date of Birth: Jun 21, 1966  Transition of Care [facility]) CM/SW Contact  Luise JAYSON Pan, [place] Phone Number: 07/25/2024, 1:11 PM  Clinical Narrative:   CSW has not obtained any updates to proceed with discharging planning efforts. CSW will continue to follow.     Expected Discharge Plan: Skilled Nursing Facility Barriers to Discharge: English as a second language teacher, Active Substance Use - Placement, SNF Pending bed offer               Expected Discharge Plan and Services       Living arrangements for the past 2 months: Apartment Expected Discharge Date: 03/14/24                                     Social Drivers of Health (SDOH) Interventions SDOH Screenings   Food Insecurity: Food Insecurity Present (03/06/2024)  Housing: High Risk (03/06/2024)  Transportation Needs: No Transportation Needs (03/06/2024)  Utilities: Patient Declined (03/06/2024)  Alcohol Screen: Medium Risk (06/24/2019)  Depression (PHQ2-9): Low Risk  (02/18/2024)  Recent Concern: Depression (PHQ2-9) - Medium Risk (01/21/2024)  Financial Resource Strain: High Risk (07/24/2021)   Received from [facility]  Physical Activity: Inactive (02/18/2024)  Social Connections: Unknown (04/05/2022)   Received from [facility]  Stress: Stress Concern Present (07/24/2021)   Received from [facility]  Tobacco Use: High Risk (03/04/2024)  Health Literacy: Adequate Health Literacy (02/18/2024)    Readmission Risk Interventions    12/21/2023    9:25 AM 12/09/2022    9:02 AM 12/01/2022    3:56 PM  Readmission Risk Prevention Plan  Transportation Screening Complete Complete Complete  HRI or Home Care Consult Complete    Social Work Consult for Recovery Care Planning/Counseling Complete    Palliative Care Screening Not Applicable    Medication Review Oceanographer) Complete Complete Complete  PCP or Specialist appointment  within 3-5 days of discharge  Complete Complete  HRI or Home Care Consult  Complete Complete  SW Recovery Care/Counseling Consult  Complete Complete  Palliative Care Screening  Not Applicable Not Applicable  Skilled Nursing Facility  Not Applicable Not Applicable  SNF Comments   n/a

## 2024-07-25 NOTE — Progress Notes (Signed)
 PROGRESS NOTE  [name]  DOB: 02-Nov-1966  PCP: Tanda Bleacher, MD FMW:[id number]  DOA: 03/04/2024  LOS: 143 days  Hospital Day: 144  Brief narrative: [name] is a 58 y.o. male with PMH significant for homelessness, DM2, HTN, CHF, bipolar disorder, schizoaffective disorder, cocaine abuse, COPD Patient is a to the ED 03/04/24 for slower speech, confusion and right-sided weakness CT head showed left MCA infarct CTA head and neck revealed an M2 occlusion on the left,   Stroke workup was completed Echocardiogram revealed large mobile left ventricular thrombus on the left and right ventricular apical thrombus in the right, ejection fraction less than 20%.   Cardiology was consulted recommended DOAC. PT evaluated patient and recommended skilled nursing facility placement.  Patient was found to lack medical decision capacity/status and not amenable to determining his discharge disposition or leave AGAINST MEDICAL ADVICE.  TOC is pursuing guardianship with APS as he is lacking medical decision capacity. He did complain of halluacinations, eating poor, refused medications, has insomnia.   Subjective: Patient was seen and examined this morning. On wheelchair, wandering around on the hallway. Not in distress.  No new change in last several days. Hemodynamically stable   Assessment and plan: Acute CVA (cerebrovascular accident)  He underwent extensive workup and imaging revealed left MCA territory infarct, involving caudate nucleus, lentiform nucleus and internal capsule confirmed on MRI. TEE was noted for large mobile left lower clot just under the mitral valve with an EF of 20% and a small right apical clot with reduced RV systolic function. Psychiatry has deemed the patient to lack capacity; he continues to remain noncompliant at times. Unable to force meds.  Continue offering throughout the day as able.   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 Last 2D echo showed an EF of less than 20% Continue Xarelto  20 mg PO daily    Schizoaffective disorder / bipolar disorder H/o polysubstance abuse Lacks medical capacity: Psych was consulted and recommended that the patient lacks medical capacity to make decisions if the patient attempts to leave AGAINST MEDICAL ADVICE he should be involuntarily committed. Psych recommending APS guardianship, TOC in touch with APS regarding this as patient lacks medical capacity. Psych meds per psychiatry.  Currently on Latuda , gabapentin    Essential hypertension: Continue Coreg  CR 24 hr 10 mg PO daily Lasix  40 mg daily Lisinopril  40 mg PO daily TAVR Aldactone  25 mg PO daily  Hydralazine  25 mg PO q6 hr PRN  IV labetalol  20 mg q3 hr PRN    Hyperlipidemia Continue Crestor  5 mg PO at bedtime.   Diabetes mellitus type 2 last A1c of 5.4. refuses monitoring  Ensure enlive PO bid  Gabapentin  200 mg PO bid    Polysubstance abuse Ativan  1 mg PO q6 hr PRN  Latuda  40 mg PO daily  Oxycodone  10 mg PO q3 hr PRN    Deconditioning: Ambulatory status complicated by previous right AKA and left BKA,  has no insurance coverage for SNF. refusing therapy at times. Meclizine  25 mg PO tid PRN    H/o COPD: Albuterol  q6 hr PRN nebs   H/o DVT: Xarelto  20 mg PO daily    Mobility:  PT Orders: Active   PT Follow up Rec: Home Health Pt (Followed By Outpt Pt For Continuing Prosthesis Training; [name] Manage Safely In An Accessible Living Space With Assist/Supervision For Medications, Meals)07/18/2024 1400   Goals of care   Code Status: Full Code  DVT prophylaxis:   rivaroxaban  (XARELTO ) tablet 20 mg   Antimicrobials: None currently Fluid: None Consultants: Psychiatry Family Communication: None at bedside  Status: Inpatient Level of care:  Med-Surg   Patient is from: Homeless Anticipated d/c to: Difficulty disposition   Diet:  Diet Order              Diet regular Fluid consistency: Thin  Diet effective now           Diet - low sodium heart healthy                   Scheduled Meds:  carvedilol   10 mg Oral Daily   furosemide   40 mg Oral Daily   gabapentin   200 mg Oral BID   lisinopril   40 mg Oral Daily   lurasidone   40 mg Oral Q breakfast   rivaroxaban   20 mg Oral QHS   rosuvastatin   5 mg Oral QHS   spironolactone   25 mg Oral Daily    PRN meds: albuterol , feeding supplement, hydrALAZINE , labetalol , lactulose , LORazepam , meclizine , mouth rinse, oxyCODONE    Infusions:    Antimicrobials: Anti-infectives (From admission, onward)    None       Objective: Vitals:   07/24/24 1839 07/24/24 1956  BP: 132/85 (!) 151/80  Pulse: 72 72  Resp: 18 18  SpO2: 100% 100%   No intake or output data in the 24 hours ending 07/25/24 1316   Filed Weights   03/26/24 0431 03/28/24 0456 04/04/24 0500  Weight: 42.7 kg 47.5 kg 61.7 kg   Weight change:  Body mass index is 20.09 kg/m.   Physical Exam: General exam: Pleasant, calm, comfortable Skin: No rashes, lesions or ulcers. HEENT: Atraumatic, normocephalic, no obvious bleeding Lungs: Clear to auscultation bilaterally,  CVS: S1, S2, no murmur,   GI/Abd: Soft, nontender, nondistended, bowel sound present,   CNS: Alert, awake, Psychiatry: Mood appropriate Extremities: Right AKA, left BKA status  Data Review: I have personally reviewed the laboratory data and studies available.  F/u labs  Unresulted Labs (From admission, onward)    None      Signed, Chapman Rota, MD [facility] 07/25/2024

## 2024-07-26 DIAGNOSIS — I639 Cerebral infarction, unspecified: Secondary | ICD-10-CM | POA: Diagnosis not present

## 2024-07-26 NOTE — Plan of Care (Signed)
  Problem: Nutrition: Goal: Adequate nutrition will be maintained 07/26/2024 0648 by Kermit Erby NOVAK, RN Outcome: Progressing 07/26/2024 0648 by Kermit Erby NOVAK, RN Outcome: Progressing   Problem: Coping: Goal: Level of anxiety will decrease 07/26/2024 0648 by Kermit Erby NOVAK, RN Outcome: Progressing 07/26/2024 0648 by Kermit Erby NOVAK, RN Outcome: Progressing   Problem: Elimination: Goal: Will not experience complications related to bowel motility 07/26/2024 0648 by Kermit Erby NOVAK, RN Outcome: Progressing 07/26/2024 0648 by Kermit Erby NOVAK, RN Outcome: Progressing Goal: Will not experience complications related to urinary retention 07/26/2024 0648 by Kermit Erby NOVAK, RN Outcome: Progressing 07/26/2024 0648 by Kermit Erby NOVAK, RN Outcome: Progressing   Problem: Pain Managment: Goal: General experience of comfort will improve and/or be controlled 07/26/2024 0648 by Kermit Erby NOVAK, RN Outcome: Progressing 07/26/2024 0648 by Kermit Erby NOVAK, RN Outcome: Progressing   Problem: Safety: Goal: Ability to remain free from injury will improve 07/26/2024 0648 by Kermit Erby NOVAK, RN Outcome: Progressing 07/26/2024 0648 by Kermit Erby NOVAK, RN Outcome: Progressing   Problem: Skin Integrity: Goal: Risk for impaired skin integrity will decrease 07/26/2024 0648 by Kermit Erby NOVAK, RN Outcome: Progressing 07/26/2024 0648 by Kermit Erby NOVAK, RN Outcome: Progressing

## 2024-07-26 NOTE — Progress Notes (Signed)
 PROGRESS NOTE  [name]  DOB: 1966-06-25  PCP: Tanda Bleacher, MD FMW:[id number]  DOA: 03/04/2024  LOS: 144 days  Hospital Day: 145  Brief narrative: [name] is a 58 y.o. male with PMH significant for homelessness, DM2, HTN, CHF, bipolar disorder, schizoaffective disorder, cocaine abuse, COPD Patient is a to the ED 03/04/24 for slower speech, confusion and right-sided weakness CT head showed left MCA infarct CTA head and neck revealed an M2 occlusion on the left,   Stroke workup was completed Echocardiogram revealed large mobile left ventricular thrombus on the left and right ventricular apical thrombus in the right, ejection fraction less than 20%.   Cardiology was consulted recommended DOAC. PT evaluated patient and recommended skilled nursing facility placement.  Patient was found to lack medical decision capacity/status and not amenable to determining his discharge disposition or leave AGAINST MEDICAL ADVICE.  TOC is pursuing guardianship with APS as he is lacking medical decision capacity. He did complain of halluacinations, eating poor, refused medications, has insomnia.   Subjective: Patient was seen and examined this morning. On his wheelchair by the bed.  Not in distress.  No new symptoms. No change or change in last several days. Seen by PT yesterday. Psychiatry is following up as needed.  Assessment and plan: Acute CVA (cerebrovascular accident)  He underwent extensive workup and imaging revealed left MCA territory infarct, involving caudate nucleus, lentiform nucleus and internal capsule confirmed on MRI. TEE was noted for large mobile left lower clot just under the mitral valve with an EF of 20% and a small right apical clot with reduced RV systolic function. Psychiatry has deemed the patient to lack capacity; he continues to remain noncompliant at times. Unable to force meds.  Continue offering throughout the day as able.   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 Last 2D echo showed an EF of less than 20% Continue Xarelto  20 mg PO daily    Schizoaffective disorder / bipolar disorder H/o polysubstance abuse Lacks medical capacity: Psych was consulted and recommended that the patient lacks medical capacity to make decisions if the patient attempts to leave AGAINST MEDICAL ADVICE he should be involuntarily committed. Psych recommending APS guardianship, TOC in touch with APS regarding this as patient lacks medical capacity. Psych meds per psychiatry.  Currently on Latuda , gabapentin    Essential hypertension: Continue Coreg  CR 24 hr 10 mg PO daily Lasix  40 mg daily Lisinopril  40 mg PO daily TAVR Aldactone  25 mg PO daily  Hydralazine  25 mg PO q6 hr PRN  IV labetalol  20 mg q3 hr PRN    Hyperlipidemia Continue Crestor  5 mg PO at bedtime.   Diabetes mellitus type 2 last A1c of 5.4. refuses monitoring  Ensure enlive PO bid  Gabapentin  200 mg PO bid    Polysubstance abuse Ativan  1 mg PO q6 hr PRN  Latuda  40 mg PO daily  Oxycodone  10 mg PO q3 hr PRN    Deconditioning: Ambulatory status complicated by previous right AKA and left BKA,  has no insurance coverage for SNF. refusing therapy at times. Meclizine  25 mg PO tid PRN    H/o COPD: Albuterol  q6 hr PRN nebs   H/o DVT: Xarelto  20 mg PO daily    Mobility:  PT Orders: Active   PT Follow up Rec: Home Health Pt (Followed By Outpt Pt For Continuing Prosthesis Training; [name] Manage Safely In An Accessible Living Space With Assist/Supervision For Medications, Meals)07/18/2024 1400  Goals of care   Code Status: Full Code     DVT prophylaxis:   rivaroxaban  (XARELTO ) tablet 20 mg   Antimicrobials: None currently Fluid: None Consultants: Psychiatry Family Communication: None at bedside  Status: Inpatient Level of care:  Med-Surg   Patient is from: Homeless Anticipated d/c to: Difficulty  disposition   Diet:  Diet Order             Diet regular Fluid consistency: Thin  Diet effective now           Diet - low sodium heart healthy                   Scheduled Meds:  carvedilol   10 mg Oral Daily   furosemide   40 mg Oral Daily   gabapentin   200 mg Oral BID   lisinopril   40 mg Oral Daily   lurasidone   40 mg Oral Q breakfast   rivaroxaban   20 mg Oral QHS   rosuvastatin   5 mg Oral QHS   spironolactone   25 mg Oral Daily    PRN meds: albuterol , feeding supplement, hydrALAZINE , labetalol , lactulose , LORazepam , meclizine , mouth rinse, oxyCODONE    Infusions:    Antimicrobials: Anti-infectives (From admission, onward)    None       Objective: Vitals:   07/25/24 0800 07/25/24 1947  BP: (!) 141/91 (!) 145/87  Pulse: 69 75  Resp: 20 18  SpO2: 100% 100%   No intake or output data in the 24 hours ending 07/26/24 1015   Filed Weights   03/26/24 0431 03/28/24 0456 04/04/24 0500  Weight: 42.7 kg 47.5 kg 61.7 kg   Weight change:  Body mass index is 20.09 kg/m.   Physical Exam: General exam: Pleasant, calm, comfortable Skin: No rashes, lesions or ulcers. HEENT: Atraumatic, normocephalic, no obvious bleeding Lungs: Clear to auscultation bilaterally,  CVS: S1, S2, no murmur,   GI/Abd: Soft, nontender, nondistended, bowel sound present,   CNS: Alert, awake, Psychiatry: Mood appropriate Extremities: Right AKA, left BKA status  Data Review: I have personally reviewed the laboratory data and studies available.  F/u labs  Unresulted Labs (From admission, onward)    None      Signed, Chapman Rota, MD [facility] 07/26/2024

## 2024-07-27 DIAGNOSIS — I639 Cerebral infarction, unspecified: Secondary | ICD-10-CM | POA: Diagnosis not present

## 2024-07-27 NOTE — Progress Notes (Signed)
 Physical Therapy Treatment Patient Details Name: [name] MRN: [id number] DOB: 1966-01-09 Today's Date: 07/27/2024   History of Present Illness Patient is 58 y.o. male presented to ED on 03/04/24 with stroke like symptoms when caregiver found him with slurred speech and right sided weakness. Head CT reveals Acute left MCA territory infarct affecting the insula, caudate nucleus, lentiform nucleus and internal capsule. PMH significant of  Schizoaffective disorder, heart failure with reduced ejection fraction, pretension, DMII, COPD, s/p bilateral amputations, chronic pain, and polysubstance abuse.    PT Comments  Continuing work on functional mobility and activity tolerance;  session focused on another fit check for R prosthesis socket, and Keziah stood in parallel bars for fit and pressure checks; incr time in standing; noting difficulty coming to full upright standing, likely attributable to tight hip flexors; Will need to stress the need for stretching hip flexors and strengthening hip extensors next session; Will also need to stress that for initial gait training with L BKA prostheis and R AKA prosthesis we will need 2 person assist for safety    If plan is discharge home, recommend the following: Supervision due to cognitive status;Direct supervision/assist for financial management;Direct supervision/assist for medications management;Assist for transportation   Can travel by private vehicle      (soon)  Equipment Recommendations  None recommended by PT    Recommendations for Other Services Speech consult;OT consult (Prosthetist)     Precautions / Restrictions Precautions Precautions: Fall Recall of Precautions/Restrictions: Impaired Precaution/Restrictions Comments: h/o BLE amputations Required Braces or Orthoses: Other Brace Other Brace: L prosthesis; and R prosthesis with Prosthetist present to chick initial socket fit Restrictions Weight Bearing Restrictions Per Provider Order:  No     Mobility  Bed Mobility               General bed mobility comments: OOB in wheelchair; dressed for prosthesis training    Transfers Overall transfer level: Needs assistance Equipment used:  (Parallel bars in AIR gym) Transfers: Sit to/from Stand Sit to Stand: Contact guard assist, Min assist, Mod assist, +2 safety/equipment           General transfer comment: 1 standing bout with up to a minute each time; Close guard for safety standing to L prosthesis and R initial prosthesis; Prosthetist present and stabilizing R prosthesis (with mechanical knee) during transitions; min/mod assist to control descent to sit; difficulty with triggering R knee to bend second bout of standing    Ambulation/Gait                   Psychologist, counselling propulsion: Both upper extremities Wheelchair Assistance Details (indicate cue type and reason): Noteworthy because pt had his L prosthesis on, and held the foot up off the floor the entire time   Tilt Bed    Modified Rankin (Stroke Patients Only) Modified Rankin (Stroke Patients Only) Pre-Morbid Rankin Score: Slight disability Modified Rankin: Moderate disability     Balance Overall balance assessment: Mild deficits observed, not formally tested Sitting-balance support: Bilateral upper extremity supported Sitting balance-Leahy Scale: Good       Standing balance-Leahy Scale: Poor Standing balance comment: Heavy reliance on UE support; but able to stand within parallel bars for 60 seconds each standing bout                            Communication Communication  Factors Affecting Communication: Difficulty expressing self;Reduced clarity of speech  Cognition Arousal: Alert Behavior During Therapy: Flat affect                             Following commands: Intact Following commands impaired: Follows one step commands inconsistently,  Follows one step commands with increased time    Cueing Cueing Techniques: Verbal cues, Visual cues  Exercises      General Comments General comments (skin integrity, edema, etc.): R brake on wheelchair difficult to engage      Pertinent Vitals/Pain Pain Assessment Pain Assessment: Faces Faces Pain Scale: Hurts a little bit Pain Location: Grimace; related to prosthesis socket fit, and effort Pain Descriptors / Indicators: Discomfort, Grimacing Pain Intervention(s): Monitored during session    Home Living                          Prior Function            PT Goals (current goals can now be found in the care plan section) Acute Rehab PT Goals Patient Stated Goal: Agreeable to working on prosthesis training PT Goal Formulation: With patient Time For Goal Achievement: 07/12/24 Potential to Achieve Goals: Fair Progress towards PT goals: Progressing toward goals    Frequency    Min 1X/week      PT Plan      Co-evaluation              AM-PAC PT 6 Clicks Mobility   Outcome Measure  Help needed turning from your back to your side while in a flat bed without using bedrails?: None Help needed moving from lying on your back to sitting on the side of a flat bed without using bedrails?: None Help needed moving to and from a bed to a chair (including a wheelchair)?: A Little Help needed standing up from a chair using your arms (e.g., wheelchair or bedside chair)?: A Lot Help needed to walk in hospital room?: Total Help needed climbing 3-5 steps with a railing? : Total 6 Click Score: 15    End of Session   Activity Tolerance: Patient tolerated treatment well Patient left: Other (comment) (rolling in hallway) Nurse Communication: Mobility status PT Visit Diagnosis: Other abnormalities of gait and mobility (R26.89);Muscle weakness (generalized) (M62.81);Other symptoms and signs involving the nervous system (R29.898);Hemiplegia and hemiparesis Hemiplegia -  Right/Left: Right Hemiplegia - dominant/non-dominant: Dominant Hemiplegia - caused by: Cerebral infarction     Time: 1213-1245 PT Time Calculation (min) (ACUTE ONLY): 32 min  Charges:    $Therapeutic Activity: 23-37 mins PT General Charges $$ ACUTE PT VISIT: 1 Visit                     [name], PT  Acute Rehabilitation Services Office [phone] Secure Chat welcomed    [name] 07/27/2024, 2:11 PM

## 2024-07-27 NOTE — TOC Progression Note (Signed)
 Transition of Care [facility]) - Progression Note    Patient Details  Name: [name] MRN: [id number] Date of Birth: 02-Oct-1966  [facility] CM/SW Contact  [name], [name] Phone Number: 07/27/2024, 1:07 PM  Clinical Narrative:   CSW updated by [facility] Supervisor that she has spoken with patient's GAL, [name], who is asking to speak with medical team again about recommendation for guardianship. CSW relayed message to MD and psychiatry with [name]'s phone number, as he does not remember speaking with psychiatry previously. Court date is still scheduled for 9/8. CSW to follow.    Expected Discharge Plan: Skilled Nursing Facility Barriers to Discharge: English as a second language teacher, Active Substance Use - Placement, SNF Pending bed offer               Expected Discharge Plan and Services       Living arrangements for the past 2 months: Apartment Expected Discharge Date: 03/14/24                                     Social Drivers of Health (SDOH) Interventions SDOH Screenings   Food Insecurity: Food Insecurity Present (03/06/2024)  Housing: High Risk (03/06/2024)  Transportation Needs: No Transportation Needs (03/06/2024)  Utilities: Patient Declined (03/06/2024)  Alcohol Screen: Medium Risk (06/24/2019)  Depression (PHQ2-9): Low Risk  (02/18/2024)  Recent Concern: Depression (PHQ2-9) - Medium Risk (01/21/2024)  Financial Resource Strain: High Risk (07/24/2021)   Received from [facility]  Physical Activity: Inactive (02/18/2024)  Social Connections: Unknown (04/05/2022)   Received from [facility]  Stress: Stress Concern Present (07/24/2021)   Received from [facility]  Tobacco Use: High Risk (03/04/2024)  Health Literacy: Adequate Health Literacy (02/18/2024)    Readmission Risk Interventions    12/21/2023    9:25 AM 12/09/2022    9:02 AM 12/01/2022    3:56 PM  Readmission Risk Prevention Plan  Transportation Screening Complete Complete Complete  HRI or Home  Care Consult Complete    Social Work Consult for Recovery Care Planning/Counseling Complete    Palliative Care Screening Not Applicable    Medication Review Oceanographer) Complete Complete Complete  PCP or Specialist appointment within 3-5 days of discharge  Complete Complete  HRI or Home Care Consult  Complete Complete  SW Recovery Care/Counseling Consult  Complete Complete  Palliative Care Screening  Not Applicable Not Applicable  Skilled Nursing Facility  Not Applicable Not Applicable  SNF Comments   n/a

## 2024-07-27 NOTE — Progress Notes (Signed)
 PROGRESS NOTE  [name]  DOB: [birth year]  PCP: Tanda Bleacher, MD FMW:[id number]  DOA: 03/04/2024  LOS: 145 days  Hospital Day: 146  Brief narrative: [name] is a 58 y.o. male with PMH significant for homelessness, DM2, HTN, CHF, bipolar disorder, schizoaffective disorder, cocaine abuse, COPD Patient is a to the ED 03/04/24 for slower speech, confusion and right-sided weakness CT head showed left MCA infarct CTA head and neck revealed an M2 occlusion on the left,   Stroke workup was completed Echocardiogram revealed large mobile left ventricular thrombus on the left and right ventricular apical thrombus in the right, ejection fraction less than 20%.   Cardiology was consulted recommended DOAC. PT evaluated patient and recommended skilled nursing facility placement.  Patient was found to lack medical decision capacity/status and not amenable to determining his discharge disposition or leave AGAINST MEDICAL ADVICE.  TOC is pursuing guardianship with APS as he is lacking medical decision capacity. He did complain of halluacinations, eating poor, refused medications, has insomnia.   Subjective: Patient was seen and examined this morning. On wheelchair on the hallway. No new issues.  Assessment and plan: Acute CVA (cerebrovascular accident)  He underwent extensive workup and imaging revealed left MCA territory infarct, involving caudate nucleus, lentiform nucleus and internal capsule confirmed on MRI. TEE was noted for large mobile left lower clot just under the mitral valve with an EF of 20% and a small right apical clot with reduced RV systolic function. Psychiatry has deemed the patient to lack capacity; he continues to remain noncompliant at times. Unable to force meds.  Continue offering throughout the day as able.   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 Last 2D echo showed an EF of less than  20% Continue Xarelto  20 mg PO daily    Schizoaffective disorder / bipolar disorder H/o polysubstance abuse Lacks medical capacity: Psych was consulted and recommended that the patient lacks medical capacity to make decisions if the patient attempts to leave AGAINST MEDICAL ADVICE he should be involuntarily committed. Psych recommending APS guardianship, TOC in touch with APS regarding this as patient lacks medical capacity. Psych meds per psychiatry.  Currently on Latuda , gabapentin    Essential hypertension: Continue Coreg  CR 24 hr 10 mg PO daily Lasix  40 mg daily Lisinopril  40 mg PO daily TAVR Aldactone  25 mg PO daily  Hydralazine  25 mg PO q6 hr PRN  IV labetalol  20 mg q3 hr PRN    Hyperlipidemia Continue Crestor  5 mg PO at bedtime.   Diabetes mellitus type 2 last A1c of 5.4. refuses monitoring  Ensure enlive PO bid  Gabapentin  200 mg PO bid    Polysubstance abuse Ativan  1 mg PO q6 hr PRN  Latuda  40 mg PO daily  Oxycodone  10 mg PO q3 hr PRN    Deconditioning: Ambulatory status complicated by previous right AKA and left BKA,  has no insurance coverage for SNF. refusing therapy at times. Meclizine  25 mg PO tid PRN    H/o COPD: Albuterol  q6 hr PRN nebs   H/o DVT: Xarelto  20 mg PO daily    Mobility:  PT Orders: Active   PT Follow up Rec: Home Health Pt (Followed By Outpt Pt For Continuing Prosthesis Training; [name] Manage Safely In An Accessible Living Space With Assist/Supervision For Medications, Meals)07/18/2024 1400   Goals of care   Code Status: Full Code     DVT prophylaxis:   rivaroxaban  (XARELTO ) tablet 20 mg  Antimicrobials: None currently Fluid: None Consultants: Psychiatry Family Communication: None at bedside  Status: Inpatient Level of care:  Med-Surg   Patient is from: Homeless Anticipated d/c to: Difficulty disposition.   Diet:  Diet Order             Diet regular Fluid consistency: Thin  Diet effective now           Diet  - low sodium heart healthy                   Scheduled Meds:  carvedilol   10 mg Oral Daily   furosemide   40 mg Oral Daily   gabapentin   200 mg Oral BID   lisinopril   40 mg Oral Daily   lurasidone   40 mg Oral Q breakfast   rivaroxaban   20 mg Oral QHS   rosuvastatin   5 mg Oral QHS   spironolactone   25 mg Oral Daily    PRN meds: albuterol , feeding supplement, hydrALAZINE , labetalol , lactulose , LORazepam , meclizine , mouth rinse, oxyCODONE    Infusions:    Antimicrobials: Anti-infectives (From admission, onward)    None       Objective: Vitals:   07/26/24 1954 07/27/24 0926  BP: 125/84 113/84  Pulse: 71 78  Resp: 18 14  SpO2: 100% 98%   No intake or output data in the 24 hours ending 07/27/24 1403   Filed Weights   03/26/24 0431 03/28/24 0456 04/04/24 0500  Weight: 42.7 kg 47.5 kg 61.7 kg   Weight change:  Body mass index is 20.09 kg/m.   Physical Exam: General exam: Pleasant, calm, comfortable Skin: No rashes, lesions or ulcers. HEENT: Atraumatic, normocephalic, no obvious bleeding Lungs: Clear to auscultation bilaterally,  CVS: S1, S2, no murmur,   GI/Abd: Soft, nontender, nondistended, bowel sound present,   CNS: Alert, awake, Psychiatry: Mood appropriate Extremities: Right AKA, left BKA status  Data Review: I have personally reviewed the laboratory data and studies available.  F/u labs  Unresulted Labs (From admission, onward)    None      Signed, Chapman Rota, MD [facility] 07/27/2024

## 2024-07-27 NOTE — Progress Notes (Signed)
 Occupational Therapy Treatment Patient Details Name: [name] MRN: [id number] DOB: [birth year] Today's Date: 07/27/2024   History of present illness Patient is 58 y.o. male presented to ED on 03/04/24 with stroke like symptoms when caregiver found him with slurred speech and right sided weakness. Head CT reveals Acute left MCA territory infarct affecting the insula, caudate nucleus, lentiform nucleus and internal capsule. PMH significant of  Schizoaffective disorder, heart failure with reduced ejection fraction, pretension, DMII, COPD, s/p bilateral amputations, chronic pain, and polysubstance abuse.   OT comments  Patient continues to be supervision for lateral scoot transfers from/to wheelchair to/from tub bench with setup provided due to limited space in bathroom.  Patient attempting to increase independence with LB dressing but continues to requires assistance to pull up pants while in wheelchair. Discharge recommendations continue to be appropriate.  Acute OT to continue to follow to address established goals to facilitate DC to next venue of care.        If plan is discharge home, recommend the following:  A little help with walking and/or transfers;A little help with bathing/dressing/bathroom;Supervision due to cognitive status;Direct supervision/assist for financial management;Direct supervision/assist for medications management   Equipment Recommendations  None recommended by OT    Recommendations for Other Services      Precautions / Restrictions Precautions Precautions: Fall Recall of Precautions/Restrictions: Impaired Precaution/Restrictions Comments: h/o BLE amputations Required Braces or Orthoses: Other Brace Other Brace: L prosthesis; and R prosthesis with Prosthetist present to chick initial socket fit Restrictions Weight Bearing Restrictions Per Provider Order: No       Mobility Bed Mobility Overal bed mobility: Modified Independent             General bed  mobility comments: OOB in wheelchair    Transfers Overall transfer level: Needs assistance Equipment used: None Transfers: Bed to chair/wheelchair/BSC            Lateral/Scoot Transfers: Supervision General transfer comment: setup for tub bench provided and supervison for transfers     Balance Overall balance assessment: Mild deficits observed, not formally tested Sitting-balance support: Bilateral upper extremity supported Sitting balance-Leahy Scale: Good                                     ADL either performed or assessed with clinical judgement   ADL Overall ADL's : Needs assistance/impaired         Upper Body Bathing: Supervision/ safety;Sitting Upper Body Bathing Details (indicate cue type and reason): in shower Lower Body Bathing: Supervison/ safety;Sitting/lateral leans Lower Body Bathing Details (indicate cue type and reason): in shower Upper Body Dressing : Set up;Sitting Upper Body Dressing Details (indicate cue type and reason): donned gown Lower Body Dressing: Minimal assistance;Sitting/lateral leans Lower Body Dressing Details (indicate cue type and reason): patient attempting to pull up with lateral leaning but unable to complete         Tub/ Shower Transfer: Supervision/safety;Tub transfer;Tub bench Tub/Shower Transfer Details (indicate cue type and reason): supervision for safety        Extremity/Trunk Assessment              Vision       Perception     Praxis     Communication Communication Communication: Impaired Factors Affecting Communication: Difficulty expressing self;Reduced clarity of speech   Cognition Arousal: Alert Behavior During Therapy: Flat affect Cognition: Cognition impaired     Awareness: Intellectual awareness intact,  Online awareness impaired   Attention impairment (select first level of impairment): Selective attention                     Following commands: Intact Following  commands impaired: Follows one step commands inconsistently, Follows one step commands with increased time      Cueing   Cueing Techniques: Verbal cues, Visual cues  Exercises      Shoulder Instructions       General Comments R brake on wheelchair difficult to engage    Pertinent Vitals/ Pain       Pain Assessment Pain Assessment: No/denies pain Pain Intervention(s): Monitored during session  Home Living                                          Prior Functioning/Environment              Frequency  Min 1X/week        Progress Toward Goals  OT Goals(current goals can now be found in the care plan section)  Progress towards OT goals: Progressing toward goals  Acute Rehab OT Goals Patient Stated Goal: none stated OT Goal Formulation: With patient Time For Goal Achievement: 07/28/24 Potential to Achieve Goals: Fair ADL Goals Pt Will Perform Lower Body Bathing: with mod assist;sitting/lateral leans Pt Will Perform Lower Body Dressing: with modified independence;sitting/lateral leans;bed level Pt Will Transfer to Toilet: with modified independence;bedside commode (drop arm BSC, no cues for safety) Pt Will Perform Toileting - Clothing Manipulation and hygiene: with mod assist;sitting/lateral leans Pt Will Perform Tub/Shower Transfer: Tub transfer;tub bench;with modified independence Pt/caregiver will Perform Home Exercise Program: Right Upper extremity;Independently;With written HEP provided (to increase coordination with use of RUE) Additional ADL Goal #1: Pt will transfer to w/c with Mod I as a precursor for OOB ADLs Additional ADL Goal #2: Pt will utilize RUE to gather 5/5 objects without overshooting. Additional ADL Goal #3: Pt will demonstrate proper safety with transfers/mobility 100% of the time during session. Additional ADL Goal #4: Pt will demonstrate appropriate safety awareness 100% of the time when positioning w/c to transfer  Plan       Co-evaluation                 AM-PAC OT 6 Clicks Daily Activity     Outcome Measure   Help from another person eating meals?: None Help from another person taking care of personal grooming?: A Little Help from another person toileting, which includes using toliet, bedpan, or urinal?: A Little Help from another person bathing (including washing, rinsing, drying)?: A Little Help from another person to put on and taking off regular upper body clothing?: A Little Help from another person to put on and taking off regular lower body clothing?: A Little 6 Click Score: 19    End of Session Equipment Utilized During Treatment: Other (comment) (wheelchair and tub bench)  OT Visit Diagnosis: Unsteadiness on feet (R26.81);Other abnormalities of gait and mobility (R26.89);Muscle weakness (generalized) (M62.81);Other symptoms and signs involving the nervous system (R29.898);Other symptoms and signs involving cognitive function;Hemiplegia and hemiparesis Hemiplegia - Right/Left: Right Hemiplegia - dominant/non-dominant: Dominant Hemiplegia - caused by: Cerebral infarction   Activity Tolerance Patient tolerated treatment well   Patient Left in chair;with call bell/phone within reach   Nurse Communication Mobility status        Time: 8940-8877 OT Time Calculation (min):  23 min  Charges: OT General Charges $OT Visit: 1 Visit OT Treatments $Self Care/Home Management : 23-37 mins  [name], OTA Acute Rehabilitation Services  Office [phone]   [name] 07/27/2024, 2:32 PM

## 2024-07-28 DIAGNOSIS — I639 Cerebral infarction, unspecified: Secondary | ICD-10-CM | POA: Diagnosis not present

## 2024-07-28 NOTE — Consult Note (Signed)
 [place] [facility] Consult Follow-up  Patient Name: [name]  MRN: [id number]  DOB: [birth year]  Consult Order details:  Orders (From admission, onward)     Start     Ordered   06/28/24 1642  IP CONSULT TO PSYCHIATRY       Ordering Provider: Elpidio Reyes DEL, MD  Provider:  (Not yet assigned)  Question Answer Comment  Location [facility]   Reason for Consult? Question about improved capacity now that patient has been receiving treatment in house.  Patient's attorney requested repeat psych consult for possible discharge planning      06/28/24 1642   04/05/24 1343  IP CONSULT TO PSYCHIATRY       Ordering Provider: Judeth Trenda BIRCH, MD  Provider:  (Not yet assigned)  Question Answer Comment  Location [facility]   Reason for Consult? Acute stroke, embolic from large intracardiac clots, refusing Eliquis .  Schizoaffective d/o, lacks MDM capacity, has consistently refused to take Latuda .  ?  parenteral/IM, possibly long-acting [facility] medications.      04/05/24 1346   03/30/24 1323  IP CONSULT TO PSYCHIATRY       Comments: 72M, lived alone, B/L amputee, wheelchair, P/W acute left MCA stroke, large cardiac thrombus, through course of hospital admission with issues of agitation, refusal of care, refusing SNF, felt unsafe to DC home and documentation regarding lack of capacity (however did not specifically see this addressed in multiple psychiatry evaluations), currently medically stable awaiting disposition. APS contacted.  Kindly refer to [facility] MDs note from 4/30 regarding need for capacity evaluation.  Ordering Provider: Judeth Trenda BIRCH, MD  Provider:  (Not yet assigned)  Question Answer Comment  Location [facility]   Reason for Consult? Kindly assist with capacity evaluation> details in comments.      03/30/24 1323   03/14/24 1515  IP CONSULT TO PSYCHIATRY       Ordering Provider: Elpidio Reyes DEL, MD  Provider:  (Not yet  assigned)  Question Answer Comment  Location [facility]   Reason for Consult? Capacity evaluation -- patient verbally abusive, wants to go home, but oriented only to self.  Lives alone, seems to be unable to care for himself at home.  Essentially refusing all cares here      03/14/24 1514             Mode of Visit: In person    Psychiatry Consult Evaluation  Service Date: July 28, 2024 LOS:  LOS: 146 days  Chief Complaint I have been hearing voices telling me to kill myself.   Primary Psychiatric Diagnoses  Hx Schizoaffective disorder unspecified versus hx Substance induced psychosis/mood disorder versus hx MDD 2.  Stimulant use disorder (cocaine), in controlled environment  Assessment  [name] is a 58 y.o. male admitted: Medically on 03/04/2024  2:31 PM for residual stroke with prolonged hospitalization due to inability to care for self. 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   Seen numerous times by psychiatry at this point. Case summary and conceptualization:  Middle age male with significant substance use issues (possible schizoaffective disorder), apparent medication non-compliance, and severe medical problems (HF w EF < 20 and repeat CVA's with large current LA thrombus). Psychiatry consulted recently for capacity eval.  Patient demonstrated fair cognition during the eval but it was decided that since the patient has been appointed a guardian ad litem and has an upcoming competency hearing, he can not be determined to have capacity to leave the hospital (at least at that time on 7/31). Psychiatry signed off.   Psychiatry consulted again on 8/3 to address medication non-compliance. There was concern  noted for psychotic symptoms. Started on Latuda  40 mg daily by Dr. Sable covering the consult service. The patient has tolerated the medication well. The apparent psychotic symptoms could have had some behavioral basis. Regardless the patient stopped having these symptoms shortly after starting Latuda .  8/28: Patient irritable with me, talks for about 30 seconds but then asks that I leave the room. Seems to be frustrated by being confined to the hospital.  Does not appear to have psychotic symptoms. [name] and primary medical team asked psychiatry to talk with GAL. I called [name] at [phone] but there was no answer. I did not get an immediate call back.     Diagnoses:  Active Hospital problems: Principal Problem:   Acute CVA (cerebrovascular accident) [facility]) Active Problems:   Schizoaffective disorder, bipolar type (HCC)   Cocaine abuse (HCC)   Type 2 diabetes mellitus (HCC)   Prolonged QT interval   Acid reflux   History of DVT (deep vein thrombosis)   Status post bilateral BKA.   COPD (chronic obstructive pulmonary disease) (HCC)   Chronic systolic CHF (congestive heart failure) (HCC)   Benign essential HTN   LV (left ventricular) mural thrombus   Elevated liver enzymes   Dilated cardiomyopathy (HCC)   Impaired decision making    Plan   ## Psychiatric Medication Recommendations:  --Continue Latuda  40 mg every morning with breakfast for psychosis-with plan to up titrated as needed. --Continue Gabapentin  200 mg twice daily   ## Medical Decision Making Capacity: will not make an assessment on capacity in the setting of having pending competency evaluation by court, scheduled for Sept.   ## Further Work-up:  -- Latuda  has very low propensity to prolong QT -- given long QT -> optimize K 4.0, Mg 2.0, and benefit-risk with all QT prolonging meds -- most recent EKG on 06/29/2024 had QtC of 511  -- Pertinent labwork reviewed earlier this admission   ## Disposition:-- There  are no psychiatric contraindications to discharge at this time. Discharge is pending competency evaluation or other legally appropriate means of discharge. Please contact ethics team if discharge is planned prior to competency evaluation.   ## Behavioral / Environmental: - No specific recommendations at this time.     ## Safety and Observation Level:  - Based on my clinical evaluation, I estimate the patient to be at low risk of self harm in the current setting. - At this time, we recommend  routine. This decision is based on my review of the chart including patient's history and current presentation, interview of the patient, mental status examination, and consideration of suicide risk including evaluating suicidal ideation, plan, intent, suicidal or self-harm behaviors, risk factors, and protective factors. This judgment is based on our ability to directly address suicide risk, implement suicide prevention strategies, and develop a safety plan while the patient is in the clinical setting. Please contact our team if there is a concern that risk level has changed.  CSSR Risk Category:C-SSRS RISK CATEGORY: No Risk  Suicide Risk Assessment: Patient has following modifiable risk factors for suicide: medication noncompliance, which we are addressing by competency evaluation  to get pt guardian and access to resources. Patient has following non-modifiable or demographic risk factors for suicide: male gender, history of suicide attempt, and psychiatric hospitalization Patient has the following protective factors against suicide: in controlled environment and not currently experiencing any acute psychiatric concerns.   Thank you for this consult request. Recommendations have been communicated to the primary team.  We will continue to follow up for now.     History of Present Illness  Relevant Aspects of [facility] Course:  Admitted on 03/04/2024 for strokelike symptoms.  Patient was diagnosed with  left MCA territory stroke which involve numerous structures.  Patient had residual deficits including right-sided weakness and cognitive impairment. Echo showing cardiac clot.  Patient Report:   From capacity assessment on 7/31: When asking patient what he would do if he were to hypothetically leave the hospital today, he reports he does not know.  When asking him where he would stay, he report that he knows people but on further questioning reports that he does not know and says that he would talk to [facility].  Patient reports that he had given his key to [name], nurse with ACT team, and currently is unaware if he has access to his apartment still.  Patient reports that he is followed by [facility] and they frequently follow him to check on his mental health and other aspects of his health care.  Asked patient if he will continue to use cocaine after discharge and he adamantly reports no.  Asked patient what he would do if he had any medical concerns, and he reported he would call out for help.  Following this discussion, patient reports that he could stay at a halfway house and therapy people there for him to get help from if he has any medical concerns.  Talked to patient about ADLs, and he reports that he is able to feed himself, use the bathroom by himself, and shower himself.  He then reports I cannot do everything myself.  When discussing who has helped in the past patient reports [name], [facility].  Asked patient if he had any family members or friends that he speaks with, and he reports he does not.  Asked patient if he could make the decision of leaving today and taking care of himself or waiting in the hospital until court evaluation and getting resources to help him function including group home, and he is unable to provide a meaningful answer for this.  Psych ROS:  Depression: Does not describe depressive symptoms.  Denies suicidal thoughts. Anxiety: Did not describe  significant anxiety symptoms. Mania (lifetime and current): Does not describe recent manic symptoms. Psychosis: (lifetime and current): Endorses some symptoms of psychosis outside of cocaine use in the past but denies any symptoms psychosis since hospitalization in April and being off of secondary medications and not using cocaine.  Collateral information:  [name], guardian ad litum, [phone]: Call and left HIPAA compliant voicemail.   Psychiatric and Social History  Psychiatric History:  Information collected from patient and chart  Prev Dx/Sx: Schizoaffective disorder Current Psych Provider: ACT team Home Meds (current): Abilify  5 mg twice a day and hydroxyzine  25 mg every 6 hours as needed Previous Med Trials: Multiple trials Therapy: Currently followed up ACT team   Prior Psych Hospitalization: Last hospitalization was at [facility] in October 2024 Prior Self Harm: History of suicidal behavior Prior Violence: History of agitation and carrying knife/scapel   Family Psych History: Unknown Family Hx  suicide: Unknown  Social History:  Not discussed during this encounter.  Not much information per chart review.  Patient did appear to live at her apartment alone prior to current admission.  Patient is followed by ACT team. Access to weapons/lethal means: Denies  Substance History Cocaine use disorder and hx of rehab. Also history of alc use but w/o complications. No other pertinent substance hx.   Exam Findings  Physical Exam:  Vital Signs:  Pulse Rate:  [80] 80 (08/27 2136) Resp:  [18] 18 (08/27 2136) BP: (139)/(102) 139/102 (08/27 2136) SpO2:  [100 %] 100 % (08/27 2136) Blood pressure (!) 139/102, pulse 80, temperature 98.4 F (36.9 C), temperature source Oral, resp. rate 18, height 5' 9 (1.753 m), weight 61.7 kg, SpO2 100%. Body mass index is 20.09 kg/m.  Physical Exam Vitals and nursing note reviewed.  HENT:     Head: Normocephalic and atraumatic.  Pulmonary:      Effort: Pulmonary effort is normal.  Musculoskeletal:     Comments: Bilateral lower extremity amputations.  Neurological:     General: No focal deficit present.     Mental Status: He is alert.     Mental Status Exam: General Appearance: Casual and Fairly Groomed.   Orientation:  Full (Time, Place, and Person)  Memory:  Recent;   Good Remote;   Fair  Concentration:  Concentration: Good  Recall:  Good  Attention  Fair  Eye Contact:  Good  Speech:  nml  Language:  Fair  Volume:  Normal  Mood: okay  Affect:  Constricted.   Thought Process:  logical  Thought Content: nml  Suicidal Thoughts:  No  Homicidal Thoughts:  No  Judgement:  Fair  Insight:  Fair  Psychomotor Activity:  Normal  Akathisia:  Negative  Fund of Knowledge:  Fair, lower end of normal. Unclear if chronic or since his CVA.       Assets:  Communication Skills Desire for Improvement Financial Resources/Insurance Leisure Time  Cognition: On brief assessment intact executive functioning.  ADL's:  Intact, subjective per patient; SNF per OT  AIMS (if indicated):        Other History   These have been pulled in through the EMR, reviewed, and updated if appropriate.  Family History:  The patient's family history includes Drug abuse in his mother; Suicidality in his mother.  Medical History: Past Medical History:  Diagnosis Date  . Asthma   . Bipolar 1 disorder (HCC)   . CHF (congestive heart failure) (HCC)   . COPD (chronic obstructive pulmonary disease) (HCC)   . Depression   . Diabetes mellitus without complication (HCC)   . GERD (gastroesophageal reflux disease)   . Homelessness   . Hypertension   . QT prolongation   . Schizoaffective disorder [facility])     Surgical History: Past Surgical History:  Procedure Laterality Date  . AMPUTATION Left 12/10/2022   Procedure: LEFT BELOW KNEE AMPUTATION;  Surgeon: Harden Jerona GAILS, MD;  Location: [facility] OR;  Service: Orthopedics;  Laterality: Left;  . AMPUTATION  TOE Bilateral 01/02/2021   Procedure: AMPUTATION TOE;  Surgeon: Gretel Ozell PARAS, DPM;  Location: [facility] OR;  Service: Podiatry;  Laterality: Bilateral;  Ok to leave patient on hospital bed  . IR ANGIO INTRA EXTRACRAN SEL COM CAROTID INNOMINATE BILAT MOD SED  12/17/2020  . IR ANGIO VERTEBRAL SEL VERTEBRAL BILAT MOD SED  12/17/2020  . MULTIPLE EXTRACTIONS WITH ALVEOLOPLASTY N/A 03/25/2023   Procedure: IRRIGATION AND DEBRIDEMENT, MULTIPLE EXTRACTION WITH ALVEOLOPLASTY X  4 , EXTRACTIONS 4 THROUGH 13, 20 THROUGHT 29;  Surgeon: Joanette Soulier, DMD;  Location: [facility] OR;  Service: Oral Surgery;  Laterality: N/A;  . RADIOLOGY WITH ANESTHESIA N/A 12/17/2020   Procedure: IR WITH ANESTHESIA;  Surgeon: Radiologist, Medication, MD;  Location: [facility] OR;  Service: Radiology;  Laterality: N/A;     Medications:   Current Facility-Administered Medications:  .  albuterol  (PROVENTIL ) (2.5 MG/3ML) 0.083% nebulizer solution 2.5 mg, 2.5 mg, Nebulization, Q6H PRN, Danton Reyes DASEN, MD, 2.5 mg at 06/05/24 0840 .  carvedilol  (COREG  CR) 24 hr capsule 10 mg, 10 mg, Oral, Daily, Lenon Pons L, MD, 10 mg at 07/28/24 1013 .  feeding supplement (ENSURE ENLIVE / ENSURE PLUS) liquid 237 mL, 237 mL, Oral, BID BM PRN, Danton Reyes DASEN, MD .  furosemide  (LASIX ) tablet 40 mg, 40 mg, Oral, Daily, [place], Hao, [place], 40 mg at 07/28/24 1013 .  gabapentin  (NEURONTIN ) capsule 200 mg, 200 mg, Oral, BID, Lord, Jamison Y, NP, 200 mg at 07/28/24 1013 .  hydrALAZINE  (APRESOLINE ) tablet 25 mg, 25 mg, Oral, Q6H PRN, Sundil, Subrina, MD, 25 mg at 07/07/24 0044 .  labetalol  (NORMODYNE ) injection 20 mg, 20 mg, Intravenous, Q3H PRN, Mansy, Jan A, MD, 20 mg at 05/24/24 2250 .  lactulose  (CHRONULAC ) 10 GM/15ML solution 20 g, 20 g, Oral, BID PRN, Akula, Vijaya, MD .  lisinopril  (ZESTRIL ) tablet 40 mg, 40 mg, Oral, Daily, Feliz Ortiz, Abraham, MD, 40 mg at 07/28/24 1013 .  LORazepam  (ATIVAN ) tablet 1 mg, 1 mg, Oral, Q6H PRN, Sreeram, Narendranath, MD .   lurasidone  (LATUDA ) tablet 40 mg, 40 mg, Oral, Q breakfast, Akintayo, Musa A, MD, 40 mg at 07/28/24 1013 .  meclizine  (ANTIVERT ) tablet 25 mg, 25 mg, Oral, TID PRN, Sreeram, Narendranath, MD, 25 mg at 07/28/24 1019 .  Oral care mouth rinse, 15 mL, Mouth Rinse, PRN, Regalado, Belkys A, MD .  oxyCODONE  (Oxy IR/ROXICODONE ) immediate release tablet 10 mg, 10 mg, Oral, Q3H PRN, Leotis, Pardeep, MD, 10 mg at 07/26/24 1845 .  rivaroxaban  (XARELTO ) tablet 20 mg, 20 mg, Oral, QHS, Ogbata, Sylvester I, MD, 20 mg at 07/27/24 2113 .  rosuvastatin  (CRESTOR ) tablet 5 mg, 5 mg, Oral, QHS, Desai, Nikita S, MD, 5 mg at 07/27/24 2113 .  spironolactone  (ALDACTONE ) tablet 25 mg, 25 mg, Oral, Daily, Pahwani, Ravi, MD, 25 mg at 07/28/24 1013  Allergies: Allergies  Allergen Reactions  . Acetaminophen  Hives, Nausea And Vomiting, Rash and Anaphylaxis    PT states that if he takes this he is going to the hospital  . Motrin  [Ibuprofen ] Anaphylaxis  . Nsaids Anaphylaxis    PT states that if he takes this he is going to the hospital  . Lyrica  Cr [Pregabalin  Er] Other (See Comments)    Dystonia Dysarthria   . Peanut-Containing Drug Products Other (See Comments)    Pt denies allergy to peanut butter 03/02/2021    Karleen Kaufmann, MD PGY-4

## 2024-07-28 NOTE — Progress Notes (Signed)
 PROGRESS NOTE  [name]  DOB: Apr 07, 1966  PCP: Tanda Bleacher, MD FMW:[id number]  DOA: 03/04/2024  LOS: 146 days  Hospital Day: 147  Brief narrative: [name] is a 58 y.o. male with PMH significant for homelessness, DM2, HTN, CHF, bipolar disorder, schizoaffective disorder, cocaine abuse, COPD Patient is a to the ED 03/04/24 for slower speech, confusion and right-sided weakness CT head showed left MCA infarct CTA head and neck revealed an M2 occlusion on the left,   Stroke workup was completed Echocardiogram revealed large mobile left ventricular thrombus on the left and right ventricular apical thrombus in the right, ejection fraction less than 20%.   Cardiology was consulted recommended DOAC. PT evaluated patient and recommended skilled nursing facility placement.  Patient was found to lack medical decision capacity/status and not amenable to determining his discharge disposition or leave AGAINST MEDICAL ADVICE.  TOC is pursuing guardianship with APS as he is lacking medical decision capacity. He did complain of halluacinations, eating poor, refused medications, has insomnia.   The patient is found sitting in his wheelchair at the nursing front desk. He states that he was looking for me and wanted to talk to me. We go back to his room. He asks me why none of the doctors come to see him. I explain that they have. He denies this. He then refuses to allow me to examine him and tells me to leave his room.  Subjective: Patient was seen and examined this morning. On wheelchair on the hallway. Refuses examination after expressing frustration that physicians don't come to visit him.  No new issues.  Assessment and plan: Acute CVA (cerebrovascular accident)  He underwent extensive workup and imaging revealed left MCA territory infarct, involving caudate nucleus, lentiform nucleus and internal capsule confirmed on MRI. TEE was noted for large mobile left lower clot just under the  mitral valve with an EF of 20% and a small right apical clot with reduced RV systolic function. Psychiatry has deemed the patient to lack capacity; he continues to remain noncompliant at times. Unable to force meds.  Continue offering throughout the day as able.   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 Last 2D echo showed an EF of less than 20% Continue Xarelto  20 mg PO daily    Schizoaffective disorder / bipolar disorder H/o polysubstance abuse Lacks capacity for medical decision making: Psych was consulted and recommended that the patient lacks medical capacity to make decisions if the patient attempts to leave AGAINST MEDICAL ADVICE he should be involuntarily committed. Psych recommending APS guardianship, TOC in touch with APS regarding this as patient lacks medical capacity. Psych meds per psychiatry.  Currently on Latuda , gabapentin    Essential hypertension: Continue Coreg  CR 24 hr 10 mg PO daily Lasix  40 mg daily Lisinopril  40 mg PO daily TAVR Aldactone  25 mg PO daily  Hydralazine  25 mg PO q6 hr PRN  IV labetalol  20 mg q3 hr PRN    Hyperlipidemia Continue Crestor  5 mg PO at bedtime.   Diabetes mellitus type 2 last A1c of 5.4. refuses monitoring  Ensure enlive PO bid  Gabapentin  200 mg PO bid    Polysubstance abuse Ativan  1 mg PO q6 hr PRN  Latuda  40 mg PO daily  Oxycodone  10 mg PO q3 hr PRN    Deconditioning: Ambulatory status complicated by previous right AKA and left BKA,  has no insurance coverage for SNF. refusing therapy at times. Meclizine  25 mg PO tid  PRN    H/o COPD: Albuterol  q6 hr PRN nebs   H/o DVT: Xarelto  20 mg PO daily    Mobility:  PT Orders: Active   PT Follow up Rec: Home Health Pt (Followed By Outpt Pt For Continuing Prosthesis Training; [name] Manage Safely In An Accessible Living Space With Assist/Supervision For Medications, Meals)07/27/2024 1400   Goals of  care   Code Status: Full Code     DVT prophylaxis:   rivaroxaban  (XARELTO ) tablet 20 mg   Antimicrobials: None currently Fluid: None Consultants: Psychiatry Family Communication: None at bedside  Status: Inpatient Level of care:  Med-Surg   Patient is from: Homeless Anticipated d/c to: Difficulty disposition.   Diet:  Diet Order             Diet regular Fluid consistency: Thin  Diet effective now           Diet - low sodium heart healthy                   Scheduled Meds:  carvedilol   10 mg Oral Daily   furosemide   40 mg Oral Daily   gabapentin   200 mg Oral BID   lisinopril   40 mg Oral Daily   lurasidone   40 mg Oral Q breakfast   rivaroxaban   20 mg Oral QHS   rosuvastatin   5 mg Oral QHS   spironolactone   25 mg Oral Daily    PRN meds: albuterol , feeding supplement, hydrALAZINE , labetalol , lactulose , LORazepam , meclizine , mouth rinse, oxyCODONE    Infusions:    Antimicrobials: Anti-infectives (From admission, onward)    None       Objective: Vitals:   07/27/24 0926 07/27/24 2136  BP: 113/84 (!) 139/102  Pulse: 78 80  Resp: 14 18  SpO2: 98% 100%   No intake or output data in the 24 hours ending 07/28/24 1556   Filed Weights   03/26/24 0431 03/28/24 0456 04/04/24 0500  Weight: 42.7 kg 47.5 kg 61.7 kg   Weight change:  Body mass index is 20.09 kg/m.   Physical Exam: Exam:  Constitutional:  The patient is awake, alert, and oriented x 3. No acute distress. Eyes:  pupils and irises appear normal Normal lids and conjunctivae Respiratory:  No increased work of breathing.  Cardiovascular:  Patient refuses examination. Abdomen:  Abdomen is soft, non-tender, non-distended No hernias, masses, or organomegaly Normoactive bowel sounds.  Musculoskeletal:  No cyanosis, clubbing, or edema as observed from 8 feet away. Skin:  No rashes, lesions, ulcers as observed from 8 feet away. palpation of skin: no induration or nodules as observed  from 8 feet away. Neurologic:  CN 2-12 intact Psychiatric:  Mental status Mood is agitated/aggressive Orientation to person, place, time  judgment and insight poor   Data Review: I have personally reviewed the laboratory data and studies available.  F/u labs  Unresulted Labs (From admission, onward)    None      Signed, Brigida Bureau, MD [facility] 07/28/2024

## 2024-07-28 NOTE — Progress Notes (Signed)
 Speech Language Pathology Treatment: Cognitive-Linguistic  Patient Details Name: [name] MRN: [id number] DOB: [birth year] Today's Date: 07/28/2024 Time: 8447-8383 SLP Time Calculation (min) (ACUTE ONLY): 24 min  Assessment / Plan / Recommendation Clinical Impression  Pt was sleeping upon SLP arrival but agreeable to wake up for speech therapy. Session today focused on sustained attention, working memory, and comprehension. Pt picked an article of interest to read, although due to difficulty seeing the words clearly enough, SLP read article aloud to the pt. He required Mod cues to answer questions correctly when given brief, intermittent pauses throughout article. It seemed to be the most challenging when there were names of specific people involved. Will continue to follow and address functional cognition and communication.    HPI HPI: Patient is 58 y.o. male presented to ED on 03/04/24 with stroke like symptoms when caregiver found him with slurred speech and right sided weakness. Head CT reveals Acute left MCA territory infarct affecting the insula, caudate nucleus, lentiform nucleus and internal capsule. Pt initially evaluated by SLP 4/5 but s/o 4/22 due to repeated refusals. SLP reordered 5/13 for repeat cognitive-linguistic eval.   PMH significant of  Schizoaffective disorder, heart failure with reduced ejection fraction, pretension, DMII, COPD, s/p bilateral amputations, chronic pain, and polysubstance abuse.      SLP Plan  Continue with current plan of care          Recommendations                         Intermittent Supervision/Assistance (assist wtih meds/finances/meals) Cognitive communication deficit (M58.158)     Continue with current plan of care     Leita SAILOR., M.A. CCC-SLP Acute Rehabilitation Services Office: [phone]  Secure chat preferred   07/28/2024, 4:22 PM

## 2024-07-29 DIAGNOSIS — I639 Cerebral infarction, unspecified: Secondary | ICD-10-CM | POA: Diagnosis not present

## 2024-07-29 NOTE — TOC Progression Note (Signed)
 Transition of Care [facility]) - Progression Note    Patient Details  Name: [name] MRN: [id number] Date of Birth: 30-Mar-1966  [facility]) CM/SW Contact  [name], [name] Phone Number: 07/29/2024, 3:50 PM  Clinical Narrative:   CSW received contact from [name] that he is trying to call psychiatry back but cannot get them. CSW sent message to psychiatry to please try to contact [facility] again to answer his questions.   CSW met with patient. He has misplaced his SNAP card, asking for CSW to assist with getting a replacement card. Patient said he received it in the mail at the hospital, but has since missplaced it and doesn't know where it is. CSW to attempt to assist.    Expected Discharge Plan: Skilled Nursing Facility Barriers to Discharge: English as a second language teacher, Active Substance Use - Placement, SNF Pending bed offer               Expected Discharge Plan and Services       Living arrangements for the past 2 months: Apartment Expected Discharge Date: 03/14/24                                     Social Drivers of Health (SDOH) Interventions SDOH Screenings   Food Insecurity: Food Insecurity Present (03/06/2024)  Housing: High Risk (03/06/2024)  Transportation Needs: No Transportation Needs (03/06/2024)  Utilities: Patient Declined (03/06/2024)  Alcohol Screen: Medium Risk (06/24/2019)  Depression (PHQ2-9): Low Risk  (02/18/2024)  Recent Concern: Depression (PHQ2-9) - Medium Risk (01/21/2024)  Financial Resource Strain: High Risk (07/24/2021)   Received from [facility]  Physical Activity: Inactive (02/18/2024)  Social Connections: Unknown (04/05/2022)   Received from [facility]  Stress: Stress Concern Present (07/24/2021)   Received from [facility]  Tobacco Use: High Risk (03/04/2024)  Health Literacy: Adequate Health Literacy (02/18/2024)    Readmission Risk Interventions    12/21/2023    9:25 AM 12/09/2022    9:02 AM 12/01/2022    3:56  PM  Readmission Risk Prevention Plan  Transportation Screening Complete Complete Complete  HRI or Home Care Consult Complete    Social Work Consult for Recovery Care Planning/Counseling Complete    Palliative Care Screening Not Applicable    Medication Review Oceanographer) Complete Complete Complete  PCP or Specialist appointment within 3-5 days of discharge  Complete Complete  HRI or Home Care Consult  Complete Complete  SW Recovery Care/Counseling Consult  Complete Complete  Palliative Care Screening  Not Applicable Not Applicable  Skilled Nursing Facility  Not Applicable Not Applicable  SNF Comments   n/a

## 2024-07-29 NOTE — Progress Notes (Signed)
 PROGRESS NOTE  Rohit Deloria  DOB: [birth year]  PCP: Tanda Bleacher, MD FMW:[id number]  DOA: 03/04/2024  LOS: 147 days  Hospital Day: 148  Brief narrative: [name] is a 58 y.o. male with PMH significant for homelessness, DM2, HTN, CHF, bipolar disorder, schizoaffective disorder, cocaine abuse, COPD Patient is a to the ED 03/04/24 for slower speech, confusion and right-sided weakness CT head showed left MCA infarct CTA head and neck revealed an M2 occlusion on the left,   Stroke workup was completed Echocardiogram revealed large mobile left ventricular thrombus on the left and right ventricular apical thrombus in the right, ejection fraction less than 20%.   Cardiology was consulted recommended DOAC. PT evaluated patient and recommended skilled nursing facility placement.  Patient was found to lack medical decision capacity/status and not amenable to determining his discharge disposition or leave AGAINST MEDICAL ADVICE.  TOC is pursuing guardianship with APS as he is lacking medical decision capacity. He did complain of halluacinations, eating poor, refused medications, has insomnia.   The patient is found lying on his bed, curled on his side. He is asked if he will allow me to examine him. He refused and asked me to leave the room.  Subjective: Patient was seen inside his room this morning. He refused exam or to speak with me.  Assessment and plan: Acute CVA (cerebrovascular accident)  He underwent extensive workup and imaging revealed left MCA territory infarct, involving caudate nucleus, lentiform nucleus and internal capsule confirmed on MRI. TEE was noted for large mobile left lower clot just under the mitral valve with an EF of 20% and a small right apical clot with reduced RV systolic function. Psychiatry has deemed the patient to lack capacity; he continues to remain noncompliant at times. Unable to force meds.  Continue offering throughout the day as able.   Cocaine  induced cardiomyopathy with a large multichamber intracardiac thrombus: Continue Lasix  40 mg PO daily. Last 2D echo showed an EF of less than 20% Continue Xarelto  20 mg PO daily    Schizoaffective disorder / bipolar disorder H/o polysubstance abuse Lacks capacity for medical decision making: Psych was consulted and recommended that the patient lacks medical capacity to make decisions if the patient attempts to leave AGAINST MEDICAL ADVICE he should be involuntarily committed. Psych recommending APS guardianship, TOC in touch with APS regarding this as patient lacks medical capacity. Psych meds per psychiatry.  Currently on Latuda , gabapentin    Essential hypertension: Continue Coreg  CR 24 hr 10 mg PO daily Lasix  40 mg daily Lisinopril  40 mg PO daily TAVR Aldactone  25 mg PO daily  Hydralazine  25 mg PO q6 hr PRN  IV labetalol  20 mg q3 hr PRN    Hyperlipidemia Continue Crestor  5 mg PO at bedtime.   Diabetes mellitus type 2 last A1c of 5.4. refuses monitoring  Ensure enlive PO bid  Gabapentin  200 mg PO bid    Polysubstance abuse Ativan  1 mg PO q6 hr PRN  Latuda  40 mg PO daily  Oxycodone  10 mg PO q3 hr PRN    Deconditioning: Ambulatory status complicated by previous right AKA and left BKA,  has no insurance coverage for SNF. refusing therapy at times. Meclizine  25 mg PO tid PRN    H/o COPD: Albuterol  q6 hr PRN nebs   H/o DVT: Xarelto  20 mg PO daily    Mobility:  PT Orders: Active   PT Follow up Rec: Home Health Pt (Followed By Outpt Pt For Continuing Prosthesis Training; [name] Manage Safely  In An Accessible Living Space With Assist/Supervision For Medications, Meals)07/27/2024 1400   Goals of care   Code Status: Full Code     DVT prophylaxis:   rivaroxaban  (XARELTO ) tablet 20 mg   Antimicrobials: None currently Fluid: None Consultants: Psychiatry Family Communication: None at bedside  Status: Inpatient Level of care:  Med-Surg   Patient is from:  Homeless Anticipated d/c to: Difficulty disposition.   Diet:  Diet Order             Diet regular Fluid consistency: Thin  Diet effective now           Diet - low sodium heart healthy                   Scheduled Meds:  carvedilol   10 mg Oral Daily   furosemide   40 mg Oral Daily   gabapentin   200 mg Oral BID   lisinopril   40 mg Oral Daily   lurasidone   40 mg Oral Q breakfast   rivaroxaban   20 mg Oral QHS   rosuvastatin   5 mg Oral QHS   spironolactone   25 mg Oral Daily    PRN meds: albuterol , feeding supplement, hydrALAZINE , labetalol , lactulose , LORazepam , meclizine , mouth rinse, oxyCODONE    Infusions:    Antimicrobials: Anti-infectives (From admission, onward)    None       Objective: Vitals:   07/29/24 0410 07/29/24 1635  BP: (!) 141/103 108/89  Pulse: 68 70  Resp:  18  SpO2: 100% 97%   No intake or output data in the 24 hours ending 07/29/24 1736   Filed Weights   03/26/24 0431 03/28/24 0456 04/04/24 0500  Weight: 42.7 kg 47.5 kg 61.7 kg   Weight change:  Body mass index is 20.09 kg/m.   Physical Exam: Exam:  Constitutional:  The patient is awake, alert, and oriented x 3. No acute distress. Eyes:  pupils and irises appear normal Normal lids and conjunctivae Respiratory:  No increased work of breathing. Pt refuses examination.  Cardiovascular:  Patient refuses examination. Abdomen:  Patient refuses examination. Musculoskeletal:  No cyanosis, clubbing, or edema as observed from 8 feet away. Skin:  No rashes, lesions, ulcers as observed from 8 feet away. palpation of skin: no induration or nodules as observed from 8 feet away. Neurologic:  CN 2-12 intact Psychiatric:  Mental status Mood is agitated/aggressive Orientation to person, place, time  judgment and insight poor   Data Review: I have personally reviewed the laboratory data and studies available.  F/u labs  Unresulted Labs (From admission, onward)    None       Signed, Brigida Bureau, MD [facility] 07/29/2024

## 2024-07-30 DIAGNOSIS — I639 Cerebral infarction, unspecified: Secondary | ICD-10-CM | POA: Diagnosis not present

## 2024-07-30 NOTE — Plan of Care (Signed)
  Problem: Nutrition: Goal: Adequate nutrition will be maintained Outcome: Progressing   Problem: Coping: Goal: Level of anxiety will decrease Outcome: Progressing   Problem: Elimination: Goal: Will not experience complications related to bowel motility Outcome: Progressing Goal: Will not experience complications related to urinary retention Outcome: Progressing   Problem: Pain Managment: Goal: General experience of comfort will improve and/or be controlled Outcome: Progressing   Problem: Safety: Goal: Ability to remain free from injury will improve Outcome: Progressing   Problem: Skin Integrity: Goal: Risk for impaired skin integrity will decrease Outcome: Progressing

## 2024-07-30 NOTE — Progress Notes (Signed)
 PROGRESS NOTE  [name]  DOB: [birth year]  PCP: Tanda Bleacher, MD FMW:[id number]  DOA: 03/04/2024  LOS: 148 days  Hospital Day: 149  Brief narrative: [name] is a 58 y.o. male with PMH significant for homelessness, DM2, HTN, CHF, bipolar disorder, schizoaffective disorder, cocaine abuse, COPD Patient is a to the ED 03/04/24 for slower speech, confusion and right-sided weakness CT head showed left MCA infarct CTA head and neck revealed an M2 occlusion on the left,   Stroke workup was completed Echocardiogram revealed large mobile left ventricular thrombus on the left and right ventricular apical thrombus in the right, ejection fraction less than 20%.   Cardiology was consulted recommended DOAC. PT evaluated patient and recommended skilled nursing facility placement.  Patient was found to lack medical decision capacity/status and not amenable to determining his discharge disposition or leave AGAINST MEDICAL ADVICE.  TOC is pursuing guardianship with APS as he is lacking medical decision capacity. He did complain of halluacinations, eating poor, refused medications, has insomnia.   The patient is sitting up at bedside in his wheelchair eating lunch upon my visit. No new complaints. Refuses to allow me to examine him.  Subjective: Patient was seen inside his room this morning. He refused exam. He is speaking with me today.  Assessment and plan: Acute CVA (cerebrovascular accident)  He underwent extensive workup and imaging revealed left MCA territory infarct, involving caudate nucleus, lentiform nucleus and internal capsule confirmed on MRI. TEE was noted for large mobile left lower clot just under the mitral valve with an EF of 20% and a small right apical clot with reduced RV systolic function. Psychiatry has deemed the patient to lack capacity; he continues to remain noncompliant at times. Unable to force meds.  Continue offering throughout the day as able.   Cocaine induced  cardiomyopathy with a large multichamber intracardiac thrombus: Continue Lasix  40 mg PO daily. Last 2D echo showed an EF of less than 20% Continue Xarelto  20 mg PO daily    Schizoaffective disorder / bipolar disorder H/o polysubstance abuse Lacks capacity for medical decision making: Psych was consulted and recommended that the patient lacks medical capacity to make decisions if the patient attempts to leave AGAINST MEDICAL ADVICE he should be involuntarily committed. Psych recommending APS guardianship, TOC in touch with APS regarding this as patient lacks medical capacity. Psych meds per psychiatry.  Currently on Latuda , gabapentin    Essential hypertension: Continue Coreg  CR 24 hr 10 mg PO daily Lasix  40 mg daily Lisinopril  40 mg PO daily TAVR Aldactone  25 mg PO daily  Hydralazine  25 mg PO q6 hr PRN  IV labetalol  20 mg q3 hr PRN    Hyperlipidemia Continue Crestor  5 mg PO at bedtime.   Diabetes mellitus type 2 last A1c of 5.4. refuses monitoring  Ensure enlive PO bid  Gabapentin  200 mg PO bid    Polysubstance abuse Ativan  1 mg PO q6 hr PRN  Latuda  40 mg PO daily  Oxycodone  10 mg PO q3 hr PRN    Deconditioning: Ambulatory status complicated by previous right AKA and left BKA,  has no insurance coverage for SNF. refusing therapy at times. Meclizine  25 mg PO tid PRN    H/o COPD: Albuterol  q6 hr PRN nebs   H/o DVT: Xarelto  20 mg PO daily    Mobility:  PT Orders: Active   PT Follow up Rec: Home Health Pt (Followed By Outpt Pt For Continuing Prosthesis Training; [name] Manage Safely In An Accessible Living Space With  Assist/Supervision For Medications, Meals)07/27/2024 1400   Goals of care   Code Status: Full Code     DVT prophylaxis:   rivaroxaban  (XARELTO ) tablet 20 mg   Antimicrobials: None currently Fluid: None Consultants: Psychiatry Family Communication: None at bedside  Status: Inpatient Level of care:  Med-Surg   Patient is from:  Homeless Anticipated d/c to: Difficulty disposition.   Diet:  Diet Order             Diet regular Fluid consistency: Thin  Diet effective now           Diet - low sodium heart healthy                   Scheduled Meds:  carvedilol   10 mg Oral Daily   furosemide   40 mg Oral Daily   gabapentin   200 mg Oral BID   lisinopril   40 mg Oral Daily   lurasidone   40 mg Oral Q breakfast   rivaroxaban   20 mg Oral QHS   rosuvastatin   5 mg Oral QHS   spironolactone   25 mg Oral Daily    PRN meds: albuterol , feeding supplement, hydrALAZINE , labetalol , lactulose , LORazepam , meclizine , mouth rinse, oxyCODONE     Antimicrobials: Anti-infectives (From admission, onward)    None       Objective: Vitals:   07/30/24 0025 07/30/24 0957  BP: (!) 140/82 137/77  Pulse: 74 (!) 104  Resp:  14  SpO2: 98% 95%   No intake or output data in the 24 hours ending 07/30/24 1613   Filed Weights   03/26/24 0431 03/28/24 0456 04/04/24 0500  Weight: 42.7 kg 47.5 kg 61.7 kg   Weight change:  Body mass index is 20.09 kg/m.   Physical Exam: Exam:  Patient is refusing to allow me to examine him again today. Constitutional:  The patient is awake, alert, and oriented x 3. No acute distress. Eyes:  pupils and irises appear normal Normal lids and conjunctivae Respiratory:  No increased work of breathing. Pt refuses examination.  Cardiovascular:  Patient refuses examination. Abdomen:  Patient refuses examination. Musculoskeletal:  Patient refuses examination. Skin:  Patient refuses examination. Neurologic:  Patient refuses examination. Psychiatric:  Mental status Mood is agitated/aggressive Orientation to person, place, time  judgment and insight poor   Data Review: I have personally reviewed the laboratory data and studies available.  F/u labs  Unresulted Labs (From admission, onward)    None      Signed, Brigida Bureau, MD [facility] 07/30/2024

## 2024-07-30 NOTE — Progress Notes (Signed)
 Pt. Refused to allow nurse to perform assessment

## 2024-07-31 DIAGNOSIS — I420 Dilated cardiomyopathy: Secondary | ICD-10-CM | POA: Diagnosis not present

## 2024-07-31 DIAGNOSIS — I5022 Chronic systolic (congestive) heart failure: Secondary | ICD-10-CM | POA: Diagnosis not present

## 2024-07-31 DIAGNOSIS — I639 Cerebral infarction, unspecified: Secondary | ICD-10-CM | POA: Diagnosis not present

## 2024-07-31 DIAGNOSIS — Z89611 Acquired absence of right leg above knee: Secondary | ICD-10-CM | POA: Diagnosis not present

## 2024-07-31 NOTE — Progress Notes (Addendum)
 PROGRESS NOTE  [name]  DOB: 01/19/1966  PCP: Tanda Bleacher, MD FMW:[id number]  DOA: 03/04/2024  LOS: 149 days  Hospital Day: 150  Brief narrative: [name] is a 58 y.o. male with PMH significant for homelessness, DM2, HTN, CHF, bipolar disorder, schizoaffective disorder, cocaine abuse and COPD brought to ED on/03/2024 for slurred speech, confusion and right-sided weakness. CT head showed left MCA infarct.  MRI brain confirmed left MCA territory infarct, involving caudate nucleus, lentiform nucleus and internal capsule.  CTA head and neck revealed an M2 occlusion on the left.  TTE revealed large mobile left ventricular thrombus on the left and right ventricular apical thrombus in the right, LVEF <20%. Cardiology was consulted recommended DOAC.  Therapy recommended SNF. Patient was found to lack medical decision capacity/status and not amenable to determining his discharge disposition or leave AGAINST MEDICAL ADVICE. TOC is pursuing guardianship with APS as he is lacking medical decision capacity.   Intermittently noncompliant with care.   Subjective: Seen earlier this morning.  Patient is sleepy but wakes to voice.  No complaints but does not want to be bothered.  He states too early although it is already 9:30 AM.  Assessment and plan: Acute CVA: CVA workup complete.  TEE showed large mobile left lower clot just under the mitral valve with an EF of 20% and a small right apical clot with reduced RV systolic function. -Continue Xarelto .   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 -Continue Lasix  40 mg PO daily. -Continue Xarelto  20 mg PO daily    Schizoaffective disorder / bipolar disorder H/o polysubstance abuse Lacks capacity for medical decision making: -Continue Latuda  and gabapentin . -Psych recommending APS guardianship, TOC in touch with APS   Essential hypertension: Continue -Continue Coreg , lisinopril , Aldactone ,  hydralazine  and Lasix    Hyperlipidemia -Continue Crestor  5 mg PO at bedtime.   Diabetes mellitus type 2: A1c 5.4%. Refuses monitoring    Polysubstance abuse: UDS positive for benzo, opiate and cocaine.   Physical deconditioning/right AKA/left BKA: Refusing therapy at times.   H/o COPD: Stable -Continue albuterol  q6 hr PRN nebs   H/o DVT: -Continue Xarelto  20 mg PO daily    PT Follow up Rec: Home Health Pt (Followed By Outpt Pt For Continuing Prosthesis Training; [name] Manage Safely In An Accessible Living Space With Assist/Supervision For Medications, Meals)07/27/2024 1400   Goals of care   Code Status: Full Code     DVT prophylaxis:   rivaroxaban  (XARELTO ) tablet 20 mg   Antimicrobials: None currently Fluid: None Consultants: Psychiatry, neurology, cardiology Family Communication: None at bedside  Status: Inpatient Level of care:  Med-Surg   Patient is from: Homeless Anticipated d/c to: Difficulty disposition.   Diet:  Diet Order             Diet regular Fluid consistency: Thin  Diet effective now           Diet - low sodium heart healthy                   Scheduled Meds:  carvedilol   10 mg Oral Daily   furosemide   40 mg Oral Daily   gabapentin   200 mg Oral BID   lisinopril   40 mg Oral Daily   lurasidone   40 mg Oral Q breakfast   rivaroxaban   20 mg Oral QHS   rosuvastatin   5 mg Oral QHS   spironolactone   25 mg Oral Daily    PRN meds: albuterol , feeding  supplement, hydrALAZINE , labetalol , lactulose , LORazepam , meclizine , mouth rinse, oxyCODONE     Antimicrobials: Anti-infectives (From admission, onward)    None       Objective: Vitals:   07/30/24 1938 07/31/24 0935  BP: 107/72 (!) 134/97  Pulse: 95 74  Resp: 17 20  SpO2: 96% 100%   No intake or output data in the 24 hours ending 07/31/24 1310   Filed Weights   03/26/24 0431 03/28/24 0456 04/04/24 0500  Weight: 42.7 kg 47.5 kg 61.7 kg   Weight change:  Body mass index  is 20.09 kg/m.   Physical Exam:  GENERAL: No apparent distress.  Nontoxic. HEENT: MMM.  Vision and hearing grossly intact.  RESP:  No IWOB.  On room air..  MSK/EXT: Right AKA.  Left BKA. SKIN: no apparent skin lesion or wound NEURO: Evaluation of voice.  Seems to be oriented fairly. PSYCH: Calm.  Somewhat dismissive.  Data Review: I have personally reviewed the laboratory data and studies available.  F/u labs  Unresulted Labs (From admission, onward)    None      Signed, Mignon ONEIDA Bump, MD [facility] 07/31/2024

## 2024-08-01 DIAGNOSIS — I639 Cerebral infarction, unspecified: Secondary | ICD-10-CM | POA: Diagnosis not present

## 2024-08-01 DIAGNOSIS — I420 Dilated cardiomyopathy: Secondary | ICD-10-CM | POA: Diagnosis not present

## 2024-08-01 DIAGNOSIS — Z89611 Acquired absence of right leg above knee: Secondary | ICD-10-CM | POA: Diagnosis not present

## 2024-08-01 DIAGNOSIS — I5022 Chronic systolic (congestive) heart failure: Secondary | ICD-10-CM | POA: Diagnosis not present

## 2024-08-01 NOTE — Progress Notes (Signed)
 PROGRESS NOTE  [name]  DOB: 1966-05-31  PCP: Tanda Bleacher, MD FMW:[id number]  DOA: 03/04/2024  LOS: 150 days  Hospital Day: 151  Brief narrative: [name] is a 58 y.o. male with PMH significant for homelessness, DM2, HTN, CHF, bipolar disorder, schizoaffective disorder, cocaine abuse and COPD brought to ED on/03/2024 for slurred speech, confusion and right-sided weakness. CT head showed left MCA infarct.  MRI brain confirmed left MCA territory infarct, involving caudate nucleus, lentiform nucleus and internal capsule.  CTA head and neck revealed an M2 occlusion on the left.  TTE revealed large mobile left ventricular thrombus on the left and right ventricular apical thrombus in the right, LVEF <20%. Cardiology was consulted recommended DOAC.  Therapy recommended SNF. Patient was found to lack medical decision capacity/status and not amenable to determining his discharge disposition or leave AGAINST MEDICAL ADVICE. TOC is pursuing guardianship with APS as he is lacking medical decision capacity.   Intermittently noncompliant with care.   Subjective: Seen earlier this morning.  Patient is sleepy but wakes to voice.  No complaints but wants to be left alone so he can sleep.  Assessment and plan: Acute CVA: CVA workup complete.  TEE showed large mobile left lower clot just under the mitral valve with an EF of 20% and a small right apical clot with reduced RV systolic function. -Continue Xarelto .   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 -Continue Lasix  40 mg PO daily. -Continue Xarelto  20 mg PO daily    Schizoaffective disorder / bipolar disorder H/o polysubstance abuse Lacks capacity for medical decision making: -Continue Latuda  and gabapentin . -Psych recommending APS guardianship, TOC in touch with APS   Essential hypertension: Continue -Continue Coreg , lisinopril , Aldactone , hydralazine  and Lasix     Hyperlipidemia -Continue Crestor  5 mg PO at bedtime.   Diabetes mellitus type 2: A1c 5.4%. Refuses monitoring    Polysubstance abuse: UDS positive for benzo, opiate and cocaine.   Physical deconditioning/right AKA/left BKA: Refusing therapy at times.   H/o COPD: Stable -Continue albuterol  q6 hr PRN nebs   H/o DVT: -Continue Xarelto  20 mg PO daily    PT Follow up Rec: Home Health Pt (Followed By Outpt Pt For Continuing Prosthesis Training; [name] Manage Safely In An Accessible Living Space With Assist/Supervision For Medications, Meals)07/27/2024 1400   Goals of care   Code Status: Full Code     DVT prophylaxis:   rivaroxaban  (XARELTO ) tablet 20 mg   Antimicrobials: None currently Fluid: None Consultants: Psychiatry, neurology, cardiology Family Communication: None at bedside  Status: Inpatient Level of care:  Med-Surg   Patient is from: Homeless Anticipated d/c to: Difficulty disposition.   Diet:  Diet Order             Diet regular Fluid consistency: Thin  Diet effective now           Diet - low sodium heart healthy                   Scheduled Meds:  carvedilol   10 mg Oral Daily   furosemide   40 mg Oral Daily   gabapentin   200 mg Oral BID   lisinopril   40 mg Oral Daily   lurasidone   40 mg Oral Q breakfast   rivaroxaban   20 mg Oral QHS   rosuvastatin   5 mg Oral QHS   spironolactone   25 mg Oral Daily    PRN meds: albuterol , feeding supplement, hydrALAZINE , labetalol , lactulose , LORazepam , meclizine , mouth rinse,  oxyCODONE     Antimicrobials: Anti-infectives (From admission, onward)    None       Objective: Vitals:   07/31/24 2257 08/01/24 0934  BP: 115/84 109/83  Pulse: 80 68  Resp:  18  SpO2: 97% 100%    Intake/Output Summary (Last 24 hours) at 08/01/2024 1116 Last data filed at 08/01/2024 0440 Gross per 24 hour  Intake 240 ml  Output --  Net 240 ml     Filed Weights   03/26/24 0431 03/28/24 0456 04/04/24 0500  Weight:  42.7 kg 47.5 kg 61.7 kg   Weight change:  Body mass index is 20.09 kg/m.   Physical Exam:  GENERAL: No apparent distress.  Nontoxic. HEENT: MMM.  Vision and hearing grossly intact.  RESP:  No IWOB.  On room air..  MSK/EXT: Right AKA.  Left BKA. SKIN: no apparent skin lesion or wound NEURO: Evaluation of voice.  Seems to be oriented fairly. PSYCH: Calm.  Somewhat dismissive.  Data Review: I have personally reviewed the laboratory data and studies available.  F/u labs  Unresulted Labs (From admission, onward)    None      Signed, Mignon ONEIDA Bump, MD [facility] 08/01/2024

## 2024-08-01 NOTE — Plan of Care (Signed)
  Problem: Nutrition: Goal: Adequate nutrition will be maintained Outcome: Progressing   Problem: Coping: Goal: Level of anxiety will decrease Outcome: Progressing   Problem: Elimination: Goal: Will not experience complications related to bowel motility Outcome: Progressing Goal: Will not experience complications related to urinary retention Outcome: Progressing   Problem: Pain Managment: Goal: General experience of comfort will improve and/or be controlled Outcome: Progressing   Problem: Safety: Goal: Ability to remain free from injury will improve Outcome: Progressing   Problem: Skin Integrity: Goal: Risk for impaired skin integrity will decrease Outcome: Progressing

## 2024-08-01 NOTE — TOC Progression Note (Signed)
 Transition of Care [facility]) - Progression Note    Patient Details  Name: [name] MRN: [id number] Date of Birth: 09/07/1966  [facility] CM/SW Contact  Inocente GORMAN Kindle, [name] Phone Number: 08/01/2024, 10:29 AM  Clinical Narrative:    Continuing to follow. Barriers remain in place.    Expected Discharge Plan: Skilled Nursing Facility Barriers to Discharge: English as a second language teacher, Active Substance Use - Placement, SNF Pending bed offer               Expected Discharge Plan and Services       Living arrangements for the past 2 months: Apartment Expected Discharge Date: 03/14/24                                     Social Drivers of Health (SDOH) Interventions SDOH Screenings   Food Insecurity: Food Insecurity Present (03/06/2024)  Housing: High Risk (03/06/2024)  Transportation Needs: No Transportation Needs (03/06/2024)  Utilities: Patient Declined (03/06/2024)  Alcohol Screen: Medium Risk (06/24/2019)  Depression (PHQ2-9): Low Risk  (02/18/2024)  Recent Concern: Depression (PHQ2-9) - Medium Risk (01/21/2024)  Financial Resource Strain: High Risk (07/24/2021)   Received from [facility]  Physical Activity: Inactive (02/18/2024)  Social Connections: Unknown (04/05/2022)   Received from [facility]  Stress: Stress Concern Present (07/24/2021)   Received from [facility]  Tobacco Use: High Risk (03/04/2024)  Health Literacy: Adequate Health Literacy (02/18/2024)    Readmission Risk Interventions    12/21/2023    9:25 AM 12/09/2022    9:02 AM 12/01/2022    3:56 PM  Readmission Risk Prevention Plan  Transportation Screening Complete Complete Complete  HRI or Home Care Consult Complete    Social Work Consult for Recovery Care Planning/Counseling Complete    Palliative Care Screening Not Applicable    Medication Review Oceanographer) Complete Complete Complete  PCP or Specialist appointment within 3-5 days of discharge  Complete Complete  HRI or Home  Care Consult  Complete Complete  SW Recovery Care/Counseling Consult  Complete Complete  Palliative Care Screening  Not Applicable Not Applicable  Skilled Nursing Facility  Not Applicable Not Applicable  SNF Comments   n/a

## 2024-08-02 DIAGNOSIS — I639 Cerebral infarction, unspecified: Secondary | ICD-10-CM | POA: Diagnosis not present

## 2024-08-02 NOTE — Progress Notes (Signed)
 PROGRESS NOTE  [name]  DOB: 04-08-1966  PCP: Tanda Bleacher, MD FMW:[id number]  DOA: 03/04/2024  LOS: 151 days  Hospital Day: 152  Brief narrative: [name] is a 58 y.o. male with PMH significant for homelessness, DM2, HTN, CHF, bipolar disorder, schizoaffective disorder, cocaine abuse, COPD Patient is a to the ED 03/04/24 for slower speech, confusion and right-sided weakness CT head showed left MCA infarct CTA head and neck revealed an M2 occlusion on the left,   Stroke workup was completed Echocardiogram revealed large mobile left ventricular thrombus on the left and right ventricular apical thrombus in the right, ejection fraction less than 20%.   Cardiology was consulted recommended DOAC. PT evaluated patient and recommended skilled nursing facility placement.  Patient was found to lack medical decision capacity/status and not amenable to determining his discharge disposition or leave AGAINST MEDICAL ADVICE.  TOC is pursuing guardianship with APS as he is lacking medical decision capacity. He did complain of halluacinations, eating poor, refused medications, has insomnia.   Subjective: Patient was seen and examined this morning. On wheelchair on the hallway. No new issues.  Assessment and plan: Acute CVA (cerebrovascular accident)  He underwent extensive workup and imaging revealed left MCA territory infarct, involving caudate nucleus, lentiform nucleus and internal capsule confirmed on MRI. TEE was noted for large mobile left lower clot just under the mitral valve with an EF of 20% and a small right apical clot with reduced RV systolic function. Psychiatry has deemed the patient to lack capacity; he continues to remain noncompliant at times. Unable to force meds.  Continue offering throughout the day as able.   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 Psych recommending APS guardianship, TOC in touch with APS regarding this as patient lacks medical capacity. Psych meds per psychiatry.  Currently on Latuda , gabapentin    Hyperlipidemia Continue Crestor  5 mg PO at bedtime.   Diabetes mellitus type 2 last A1c of 5.4. refuses monitoring  Ensure enlive PO bid  Gabapentin  200 mg PO bid    Polysubstance abuse Ativan  1 mg PO q6 hr PRN  Latuda  40 mg PO daily  Oxycodone  10 mg PO q3 hr PRN    Deconditioning: Ambulatory status complicated by previous right AKA and left BKA,  has no insurance coverage for SNF. refusing therapy at times. Meclizine  25 mg PO tid PRN    H/o COPD: Albuterol  q6 hr PRN nebs   H/o DVT: Xarelto  20 mg PO daily    Mobility:  PT Orders: Active   PT Follow up Rec: Home Health Pt (Followed By Outpt Pt For Continuing Prosthesis Training; [name] Manage Safely In An Accessible Living Space With Assist/Supervision For Medications, Meals)08/02/2024 1400   Goals of care   Code Status: Full Code     DVT prophylaxis:   rivaroxaban  (XARELTO ) tablet 20 mg   Antimicrobials: None currently Fluid: None Consultants: Psychiatry Family Communication: None at bedside  Status: Inpatient Level of care:  Med-Surg   Patient is from: Homeless Anticipated d/c to: Difficulty disposition.   Diet:  Diet Order  Diet regular Fluid consistency: Thin  Diet effective now           Diet - low sodium heart healthy                   Scheduled Meds:  carvedilol   10 mg Oral Daily   furosemide   40 mg Oral Daily   gabapentin   200 mg Oral BID   lisinopril   40 mg Oral Daily    lurasidone   40 mg Oral Q breakfast   rivaroxaban   20 mg Oral QHS   rosuvastatin   5 mg Oral QHS   spironolactone   25 mg Oral Daily    PRN meds: albuterol , feeding supplement, hydrALAZINE , labetalol , lactulose , LORazepam , meclizine , mouth rinse, oxyCODONE    Infusions:    Antimicrobials: Anti-infectives (From admission, onward)    None       Objective: Vitals:   08/01/24 1649 08/01/24 1952  BP: 115/73 117/68  Pulse: 80 84  Resp: 18 18  SpO2: 100% 99%   No intake or output data in the 24 hours ending 08/02/24 1448   Filed Weights   03/26/24 0431 03/28/24 0456 04/04/24 0500  Weight: 42.7 kg 47.5 kg 61.7 kg   Weight change:  Body mass index is 20.09 kg/m.   Physical Exam: General exam: Pleasant, calm, comfortable Skin: No rashes, lesions or ulcers. HEENT: Atraumatic, normocephalic, no obvious bleeding Lungs: Clear to auscultation bilaterally,  CVS: S1, S2, no murmur,   GI/Abd: Soft, nontender, nondistended, bowel sound present,   CNS: Alert, awake, Psychiatry: Mood appropriate Extremities: Right AKA, left BKA status  Data Review: I have personally reviewed the laboratory data and studies available.  F/u labs  Unresulted Labs (From admission, onward)    None      Signed, Chapman Rota, MD [facility] 08/02/2024

## 2024-08-02 NOTE — Progress Notes (Signed)
 OT observing pt with functional mobility and ADL with and without OTA; discussed goals and progression toward established OT goals with treating therapist as well as treating therapist discussion of goals with pt. Hopeful to receive prosthesis next week, therefore goals updated for pt centered therapy sessions to progress his intrinsically motivated goal to learn about and use prosthesis. Will continue to follow acutely.     08/02/24 1517  ADL Goals  Pt Will Perform Lower Body Dressing with modified independence;sitting/lateral leans  Pt Will Perform Tub/Shower Transfer Tub transfer;tub bench;with modified independence  Additional ADL Goal #1 pt will don prosthesis mod I  Additional ADL Goal #2 pt will perform squat pivot with prosthesis donned with mod I     Elma JONETTA Lebron FREDERICK, OTR/L [facility] Acute Rehabilitation Office: [phone]

## 2024-08-02 NOTE — Progress Notes (Signed)
 Physical Therapy Treatment Patient Details Name: [name] MRN: [id number] DOB: Mar 02, 1966 Today's Date: 08/02/2024   History of Present Illness Patient is 58 y.o. male presented to ED on 03/04/24 with stroke like symptoms when caregiver found him with slurred speech and right sided weakness. Head CT reveals Acute left MCA territory infarct affecting the insula, caudate nucleus, lentiform nucleus and internal capsule. As of August, pt has been working toward getting and fitting for a transfemoral prosthesis; PMH significant of  Schizoaffective disorder, heart failure with reduced ejection fraction, pretension, DMII, COPD, s/p bilateral amputations, chronic pain, and polysubstance abuse.    PT Comments  Continuing work on functional mobility and activity tolerance;  Initial plan for session was for final fit of R prosthesis, however his R prosthesis isn't ready yet, and [name], [facility], contacted this PT asking about rescheduling for next week; Informed [name] of need to reschedule and he received the news well, voiced understanding that will have to push getting prosthesis to next week;    [name] has been clear about not wanting co-sessions, keeping sessions to just one therapist; the difficulty is for safety with initial steps and gait training with L BKA prosthesis and more complex L prosthesis (including a prosthetic knee) we will need 2 person assist for safety; have communicated this to [name] both verbally and in writing;   [name] also very much wants to work with crutches for prosthesis/gait training; For safety, prosthesis/gait training starts in very stable parallel bars, then to RW, then to crutches; Have tried to explain this rationale to [name], however he doesn't seem to understand of starting with most stable support and progressing to less stable assistive devices -- I anticipate we may need to give [name] the option to try crutches (with 2 person assist) in a planned failure type  scenario;  Addendum: Shortly after speaking with [name] about the need to reschedule for the R prosthesis, he reached out to this PT to request that we not meet on Tuesday, when he has his court case scheduled; will contact prosthetist to schedule for next Wednesday   If plan is discharge home, recommend the following: Supervision due to cognitive status;Direct supervision/assist for financial management;Direct supervision/assist for medications management;Assist for transportation   Can travel by private vehicle      (soon)  Equipment Recommendations  Other (comment) (His wheelchair needs legrests)    Recommendations for Other Services Speech consult;OT consult (Prosthetist)     Precautions / Restrictions Precautions Precautions: Fall Recall of Precautions/Restrictions: Impaired Precaution/Restrictions Comments: h/o BLE amputations Required Braces or Orthoses: Other Brace Restrictions Weight Bearing Restrictions Per Provider Order: No           Modified Rankin (Stroke Patients Only) Modified Rankin (Stroke Patients Only) Pre-Morbid Rankin Score: Slight disability Modified Rankin: Moderate disability                                                     Cognition Arousal: Alert Behavior During Therapy: Flat affect                           PT - Cognition Comments: Very focused on using crutches for prosthesis training, and not very open to education re: typical process for gait training with prosthesis from using parallel bars to RW then to crutches; Does  not want to work with more than one therapist, but will need +2 assist for safety with gait training with L and R prostheses Following commands: Intact      Cueing    Exercises  Pt provided with printout of HEP from [employer] (which includes a QR code for [employer] app with videos); Focused on 2 exercises -- first Modified Thomas stretch of hip flexors, second bolstered bridging for hip  extension strengthening    General Comments General comments (skin integrity, edema, etc.): Spent time coordinating with OT team and prosthetist to make plans for safe progression      Pertinent Vitals/Pain Pain Assessment Pain Assessment: Faces Faces Pain Scale: No hurt Pain Intervention(s): Monitored during session    Home Living                          Prior Function            PT Goals (current goals can now be found in the care plan section) Acute Rehab PT Goals Patient Stated Goal: Agreeable to working on prosthesis training PT Goal Formulation: With patient Time For Goal Achievement: 07/12/24 Potential to Achieve Goals: Fair Progress towards PT goals: Progressing toward goals    Frequency    Min 1X/week      PT Plan      Co-evaluation              AM-PAC PT 6 Clicks Mobility   Outcome Measure  Help needed turning from your back to your side while in a flat bed without using bedrails?: None Help needed moving from lying on your back to sitting on the side of a flat bed without using bedrails?: None Help needed moving to and from a bed to a chair (including a wheelchair)?: A Little Help needed standing up from a chair using your arms (e.g., wheelchair or bedside chair)?: A Lot Help needed to walk in hospital room?: Total Help needed climbing 3-5 steps with a railing? : Total 6 Click Score: 15    End of Session     Patient left: Other (comment) (in wheelchair in room)   PT Visit Diagnosis: Other abnormalities of gait and mobility (R26.89);Muscle weakness (generalized) (M62.81);Other symptoms and signs involving the nervous system (R29.898);Hemiplegia and hemiparesis Hemiplegia - Right/Left: Right Hemiplegia - dominant/non-dominant: Dominant Hemiplegia - caused by: Cerebral infarction     Time: 1200-1215 PT Time Calculation (min) (ACUTE ONLY): 15 min  Charges:    $Self Care/Home Management: 8-22 PT General Charges $$ ACUTE PT  VISIT: 1 Visit                     [name], PT  Acute Rehabilitation Services Office [phone] Secure Chat welcomed    [name] VEAR [name] 08/02/2024, 2:41 PM

## 2024-08-02 NOTE — Progress Notes (Signed)
 Occupational Therapy Treatment Patient Details Name: [name] MRN: [id number] DOB: [birth year] Today's Date: 08/02/2024   History of present illness Patient is 58 y.o. male presented to ED on 03/04/24 with stroke like symptoms when caregiver found him with slurred speech and right sided weakness. Head CT reveals Acute left MCA territory infarct affecting the insula, caudate nucleus, lentiform nucleus and internal capsule. As of August, pt has been working toward getting and fitting for a transfemoral prosthesis; PMH significant of  Schizoaffective disorder, heart failure with reduced ejection fraction, pretension, DMII, COPD, s/p bilateral amputations, chronic pain, and polysubstance abuse.   OT comments  Patient agreeable to OT treatment to address self care. COTA left room to retrieve tub bench and upon return patient on regular toilet. Patient able to transfer to/from toilet from/to wheelchair without assistance and able to perform toilet management.  Patient continues to be supervision for tub bench transfers and bathing in shower for safety.  Patient able to get to dressed while seated in wheelchair but requires assistance to pull up pants with patient using BUEs to clear bottom.  Discharge recommendations continue to be appropriate.  Acute OT to continue to follow to address established goals to facilitate DC to next venue of care.        If plan is discharge home, recommend the following:  A little help with walking and/or transfers;A little help with bathing/dressing/bathroom;Supervision due to cognitive status;Direct supervision/assist for financial management;Direct supervision/assist for medications management   Equipment Recommendations  None recommended by OT    Recommendations for Other Services      Precautions / Restrictions Precautions Precautions: Fall Recall of Precautions/Restrictions: Impaired Precaution/Restrictions Comments: h/o BLE amputations Required Braces or  Orthoses: Other Brace Other Brace: L prosthesis; and R prosthesis with Prosthetist present to chick initial socket fit Restrictions Weight Bearing Restrictions Per Provider Order: No       Mobility Bed Mobility Overal bed mobility: Modified Independent             General bed mobility comments: OOB in wheelchair    Transfers Overall transfer level: Needs assistance Equipment used: None Transfers: Bed to chair/wheelchair/BSC            Lateral/Scoot Transfers: Supervision General transfer comment: performing transfer to/from toilet with mod I and supervision for tub bench transfers     Balance Overall balance assessment: Mild deficits observed, not formally tested                                         ADL either performed or assessed with clinical judgement   ADL Overall ADL's : Needs assistance/impaired     Grooming: Wash/dry hands;Wash/dry face;Oral care;Independent;Sitting Grooming Details (indicate cue type and reason): chair level at sink Upper Body Bathing: Supervision/ safety;Sitting Upper Body Bathing Details (indicate cue type and reason): in shower Lower Body Bathing: Supervison/ safety;Sitting/lateral leans Lower Body Bathing Details (indicate cue type and reason): distant supervision Upper Body Dressing : Set up;Sitting Upper Body Dressing Details (indicate cue type and reason): paper scrub top Lower Body Dressing: Minimal assistance;Sitting/lateral leans Lower Body Dressing Details (indicate cue type and reason): patient attempting to pull up with lateral leaning but unable to complete Toilet Transfer: Modified Independent;Regular Toilet Toilet Transfer Details (indicate cue type and reason): patient able to transfer on/off toilet from/to wheelchair with lateral scoot transfer Toileting- Clothing Manipulation and Hygiene: Modified independent;Sitting/lateral lean  General ADL Comments: patient seated on toilet upon entry  and patient able to perform toilet hygiene and transfer back to wheelchair without assistance    Extremity/Trunk Assessment              Vision       Perception     Praxis     Communication Communication Communication: Impaired Factors Affecting Communication: Difficulty expressing self;Reduced clarity of speech   Cognition Arousal: Alert Behavior During Therapy: Flat affect Cognition: Cognition impaired         Attention impairment (select first level of impairment): Selective attention Executive functioning impairment (select all impairments): Problem solving, Reasoning                   Following commands: Intact Following commands impaired: Follows multi-step commands with increased time      Cueing   Cueing Techniques: Verbal cues, Visual cues  Exercises      Shoulder Instructions       General Comments      Pertinent Vitals/ Pain       Pain Assessment Pain Assessment: No/denies pain  Home Living                                          Prior Functioning/Environment              Frequency  Min 1X/week        Progress Toward Goals  OT Goals(current goals can now be found in the care plan section)  Progress towards OT goals: Progressing toward goals  Acute Rehab OT Goals Patient Stated Goal: independence OT Goal Formulation: With patient Time For Goal Achievement: 08/16/24 Potential to Achieve Goals: Fair ADL Goals Pt Will Perform Lower Body Bathing: with mod assist;sitting/lateral leans Pt Will Perform Lower Body Dressing: with modified independence;sitting/lateral leans;bed level Pt Will Transfer to Toilet: with modified independence;bedside commode (drop arm BSC, no cues for safety) Pt Will Perform Toileting - Clothing Manipulation and hygiene: with mod assist;sitting/lateral leans Pt Will Perform Tub/Shower Transfer: Tub transfer;tub bench;with modified independence Pt/caregiver will Perform Home  Exercise Program: Right Upper extremity;Independently;With written HEP provided (to increase coordination with use of RUE) Additional ADL Goal #1: Pt will transfer to w/c with Mod I as a precursor for OOB ADLs Additional ADL Goal #2: Pt will utilize RUE to gather 5/5 objects without overshooting. Additional ADL Goal #3: Pt will demonstrate proper safety with transfers/mobility 100% of the time during session. Additional ADL Goal #4: Pt will demonstrate appropriate safety awareness 100% of the time when positioning w/c to transfer  Plan      Co-evaluation                 AM-PAC OT 6 Clicks Daily Activity     Outcome Measure   Help from another person eating meals?: None Help from another person taking care of personal grooming?: A Little Help from another person toileting, which includes using toliet, bedpan, or urinal?: A Little Help from another person bathing (including washing, rinsing, drying)?: A Little Help from another person to put on and taking off regular upper body clothing?: A Little Help from another person to put on and taking off regular lower body clothing?: A Little 6 Click Score: 19    End of Session Equipment Utilized During Treatment: Other (comment) (wheelchair and tub bench)  OT Visit Diagnosis: Unsteadiness on feet (  R26.81);Other abnormalities of gait and mobility (R26.89);Muscle weakness (generalized) (M62.81);Other symptoms and signs involving the nervous system (R29.898);Other symptoms and signs involving cognitive function;Hemiplegia and hemiparesis Hemiplegia - Right/Left: Right Hemiplegia - dominant/non-dominant: Dominant Hemiplegia - caused by: Cerebral infarction   Activity Tolerance Patient tolerated treatment well   Patient Left in chair;with call bell/phone within reach   Nurse Communication Mobility status        Time: 9240-9151 OT Time Calculation (min): 49 min  Charges: OT General Charges $OT Visit: 1 Visit OT Treatments $Self  Care/Home Management : 38-52 mins  [name], OTA Acute Rehabilitation Services  Office [phone]   [name] 08/02/2024, 11:05 AM

## 2024-08-03 DIAGNOSIS — I639 Cerebral infarction, unspecified: Secondary | ICD-10-CM | POA: Diagnosis not present

## 2024-08-03 NOTE — Progress Notes (Signed)
 PROGRESS NOTE  [name]  DOB: 1966/02/20  PCP: Tanda Bleacher, MD FMW:[id number]  DOA: 03/04/2024  LOS: 152 days  Hospital Day: 153  Brief narrative: [name] is a 58 y.o. male with PMH significant for homelessness, DM2, HTN, CHF, bipolar disorder, schizoaffective disorder, cocaine abuse, COPD Patient is a to the ED 03/04/24 for slower speech, confusion and right-sided weakness CT head showed left MCA infarct CTA head and neck revealed an M2 occlusion on the left,   Stroke workup was completed Echocardiogram revealed large mobile left ventricular thrombus on the left and right ventricular apical thrombus in the right, ejection fraction less than 20%.   Cardiology was consulted recommended DOAC. PT evaluated patient and recommended skilled nursing facility placement.  Patient was found to lack medical decision capacity/status and not amenable to determining his discharge disposition or leave AGAINST MEDICAL ADVICE.  TOC is pursuing guardianship with APS as he is lacking medical decision capacity. He did complain of halluacinations, eating poor, refused medications, has insomnia.   Subjective: Patient was seen and examined this morning. On wheelchair on the hallway. Not in distress No new issues.  Assessment and plan: Acute CVA (cerebrovascular accident)  He underwent extensive workup and imaging revealed left MCA territory infarct, involving caudate nucleus, lentiform nucleus and internal capsule confirmed on MRI. TEE was noted for large mobile left lower clot just under the mitral valve with an EF of 20% and a small right apical clot with reduced RV systolic function. Psychiatry has deemed the patient to lack capacity; he continues to remain noncompliant at times. Unable to force meds.  Continue offering throughout the day as able.   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 Psych recommending APS guardianship, TOC in touch with APS regarding this as patient lacks medical capacity. Psych meds per psychiatry.  Currently on Latuda , gabapentin    Hyperlipidemia Continue Crestor  5 mg PO at bedtime.   Diabetes mellitus type 2 last A1c of 5.4. refuses monitoring  Ensure enlive PO bid  Gabapentin  200 mg PO bid    Polysubstance abuse Ativan  1 mg PO q6 hr PRN  Latuda  40 mg PO daily  Oxycodone  10 mg PO q3 hr PRN    Deconditioning: Ambulatory status complicated by previous right AKA and left BKA,  has no insurance coverage for SNF. refusing therapy at times. Meclizine  25 mg PO tid PRN    H/o COPD: Albuterol  q6 hr PRN nebs   H/o DVT: Xarelto  20 mg PO daily    Mobility:  PT Orders: Active   PT Follow up Rec: Home Health Pt (Followed By Outpt Pt For Continuing Prosthesis Training; [name] Manage Safely In An Accessible Living Space With Assist/Supervision For Medications, Meals)08/02/2024 1400   Goals of care   Code Status: Full Code     DVT prophylaxis:   rivaroxaban  (XARELTO ) tablet 20 mg   Antimicrobials: None currently Fluid: None Consultants: Psychiatry Family Communication: None at bedside  Status: Inpatient Level of care:  Med-Surg   Patient is from: Homeless Anticipated d/c to: Difficulty disposition.   Diet:  Diet Order  Diet regular Fluid consistency: Thin  Diet effective now           Diet - low sodium heart healthy                   Scheduled Meds:  carvedilol   10 mg Oral Daily   furosemide   40 mg Oral Daily   gabapentin   200 mg Oral BID   lisinopril   40 mg  Oral Daily   lurasidone   40 mg Oral Q breakfast   rivaroxaban   20 mg Oral QHS   rosuvastatin   5 mg Oral QHS   spironolactone   25 mg Oral Daily    PRN meds: albuterol , feeding supplement, hydrALAZINE , labetalol , lactulose , LORazepam , meclizine , mouth rinse, oxyCODONE    Infusions:    Antimicrobials: Anti-infectives (From admission, onward)    None       Objective: Vitals:   08/02/24 1934 08/03/24 0951  BP: 101/71 113/83  Pulse: 79   Resp: 18   SpO2: 99%     Intake/Output Summary (Last 24 hours) at 08/03/2024 1432 Last data filed at 08/03/2024 1225 Gross per 24 hour  Intake 200 ml  Output --  Net 200 ml     Filed Weights   03/26/24 0431 03/28/24 0456 04/04/24 0500  Weight: 42.7 kg 47.5 kg 61.7 kg   Weight change:  Body mass index is 20.09 kg/m.   Physical Exam: General exam: Pleasant, calm, comfortable on wheelchair Skin: No rashes, lesions or ulcers. HEENT: Atraumatic, normocephalic, no obvious bleeding Lungs: Clear to auscultation bilaterally,  CVS: S1, S2, no murmur,   GI/Abd: Soft, nontender, nondistended, bowel sound present,   CNS: Alert, awake, Psychiatry: Mood appropriate Extremities: Right AKA, left BKA status  Data Review: I have personally reviewed the laboratory data and studies available.  F/u labs  Unresulted Labs (From admission, onward)    None      Signed, Chapman Rota, MD [facility] 08/03/2024

## 2024-08-04 DIAGNOSIS — I639 Cerebral infarction, unspecified: Secondary | ICD-10-CM | POA: Diagnosis not present

## 2024-08-04 NOTE — Progress Notes (Signed)
 PROGRESS NOTE  [name]  DOB: [birth year]  PCP: Tanda Bleacher, MD FMW:[id number]  DOA: 03/04/2024  LOS: 153 days  Hospital Day: 154  Brief narrative: [name] is a 58 y.o. male with PMH significant for homelessness, DM2, HTN, CHF, bipolar disorder, schizoaffective disorder, cocaine abuse, COPD Patient is a to the ED 03/04/24 for slower speech, confusion and right-sided weakness CT head showed left MCA infarct CTA head and neck revealed an M2 occlusion on the left,   Stroke workup was completed Echocardiogram revealed large mobile left ventricular thrombus on the left and right ventricular apical thrombus in the right, ejection fraction less than 20%.   Cardiology was consulted recommended DOAC. PT evaluated patient and recommended skilled nursing facility placement.  Patient was found to lack medical decision capacity/status and not amenable to determining his discharge disposition or leave AGAINST MEDICAL ADVICE.  TOC is pursuing guardianship with APS as he is lacking medical decision capacity. He did complain of halluacinations, eating poor, refused medications, has insomnia.   Subjective: Patient was seen and examined this morning. On wheelchair on the hallway. Not in distress.  No new issues.  Assessment and plan: Acute CVA (cerebrovascular accident)  He underwent extensive workup and imaging revealed left MCA territory infarct, involving caudate nucleus, lentiform nucleus and internal capsule confirmed on MRI. TEE was noted for large mobile left lower clot just under the mitral valve with an EF of 20% and a small right apical clot with reduced RV systolic function. Psychiatry has deemed the patient to lack capacity; he continues to remain noncompliant at times. Unable to force meds.  Continue offering throughout the day as able.   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 Psych recommending APS guardianship, TOC in touch with APS regarding this as patient lacks medical capacity. Psych meds per psychiatry.  Currently on Latuda , gabapentin    Hyperlipidemia Continue Crestor  5 mg PO at bedtime.   Diabetes mellitus type 2 last A1c of 5.4. refuses monitoring  Ensure enlive PO bid  Gabapentin  200 mg PO bid    Polysubstance abuse Ativan  1 mg PO q6 hr PRN  Latuda  40 mg PO daily  Oxycodone  10 mg PO q3 hr PRN    Deconditioning: Ambulatory status complicated by previous right AKA and left BKA,  has no insurance coverage for SNF. refusing therapy at times. Meclizine  25 mg PO tid PRN    H/o COPD: Albuterol  q6 hr PRN nebs   H/o DVT: Xarelto  20 mg PO daily    Mobility:  PT Orders: Active   PT Follow up Rec: Home Health Pt (Followed By Outpt Pt For Continuing Prosthesis Training; [name] Manage Safely In An Accessible Living Space With Assist/Supervision For Medications, Meals)08/02/2024 1400   Goals of care   Code Status: Full Code     DVT prophylaxis:   rivaroxaban  (XARELTO ) tablet 20 mg   Antimicrobials: None currently Fluid: None Consultants: Psychiatry Family Communication: None at bedside  Status: Inpatient Level of care:  Med-Surg   Patient is from: Homeless Anticipated d/c to: Difficulty disposition.   Diet:  Diet Order  Diet regular Fluid consistency: Thin  Diet effective now           Diet - low sodium heart healthy                   Scheduled Meds:  carvedilol   10 mg Oral Daily   furosemide   40 mg Oral Daily   gabapentin   200 mg Oral BID   lisinopril   40 mg  Oral Daily   lurasidone   40 mg Oral Q breakfast   rivaroxaban   20 mg Oral QHS   rosuvastatin   5 mg Oral QHS   spironolactone   25 mg Oral Daily    PRN meds: albuterol , feeding supplement, hydrALAZINE , labetalol , lactulose , LORazepam , meclizine , mouth rinse, oxyCODONE    Infusions:    Antimicrobials: Anti-infectives (From admission, onward)    None       Objective: Vitals:   08/03/24 0951 08/03/24 2028  BP: 113/83 (!) 159/76  Pulse: 70 91  Resp: 16 18  Temp: (!) 97.5 F (36.4 C)   SpO2: 100% 98%   No intake or output data in the 24 hours ending 08/04/24 1458    Filed Weights   03/26/24 0431 03/28/24 0456 04/04/24 0500  Weight: 42.7 kg 47.5 kg 61.7 kg   Weight change:  Body mass index is 20.09 kg/m.   Physical Exam: General exam: Pleasant, calm, comfortable on wheelchair Skin: No rashes, lesions or ulcers. HEENT: Atraumatic, normocephalic, no obvious bleeding Lungs: Clear to auscultation bilaterally,  CVS: S1, S2, no murmur,   GI/Abd: Soft, nontender, nondistended, bowel sound present,   CNS: Alert, awake, Psychiatry: Mood appropriate Extremities: Right AKA, left BKA status  Data Review: I have personally reviewed the laboratory data and studies available.  F/u labs  Unresulted Labs (From admission, onward)    None      Signed, Chapman Rota, MD [facility] 08/04/2024

## 2024-08-04 NOTE — Progress Notes (Signed)
 SLP Cancellation Note  Patient Details Name: [name] MRN: [id number] DOB: 1966/01/06   Cancelled treatment:       Reason Eval/Treat Not Completed: Other (comment) Pt sleeping and did not wake up to knocking, name calling. Will f/u at another time.     Leita SAILOR., M.A. CCC-SLP Acute Rehabilitation Services Office: [phone]  Secure chat preferred  08/04/2024, 3:37 PM

## 2024-08-05 DIAGNOSIS — I639 Cerebral infarction, unspecified: Secondary | ICD-10-CM | POA: Diagnosis not present

## 2024-08-05 NOTE — TOC Progression Note (Signed)
 Transition of Care [facility]) - Progression Note    Patient Details  Name: [name] MRN: [id number] Date of Birth: 1966/05/06  [facility] CM/SW Contact  [name], [name] Phone Number: 08/05/2024, 3:46 PM  Clinical Narrative:   CSW met with patient to discuss lost SNAP card. He has not been able to get in touch with anyone at DSS about it. CSW attempted to reach Reena Starks with DSS SNAP program, left a voicemail asking for a return call for patient's caseworker's number. CSW provided number to patient, as well. CSW attempted to assist patient with signing up for online access to his account, but he needs the card number to finalize his account; he doesn't have the card number since it was lost. CSW attempted to assist the patient with calling the number to report it lost, but the hold time was too long and patient lost patience and said he would try again later. CSW left phone number for patient to call.    Expected Discharge Plan: Skilled Nursing Facility Barriers to Discharge: English as a second language teacher, Active Substance Use - Placement, SNF Pending bed offer               Expected Discharge Plan and Services       Living arrangements for the past 2 months: Apartment Expected Discharge Date: 03/14/24                                     Social Drivers of Health (SDOH) Interventions SDOH Screenings   Food Insecurity: Food Insecurity Present (03/06/2024)  Housing: High Risk (03/06/2024)  Transportation Needs: No Transportation Needs (03/06/2024)  Utilities: Patient Declined (03/06/2024)  Alcohol Screen: Medium Risk (06/24/2019)  Depression (PHQ2-9): Low Risk  (02/18/2024)  Recent Concern: Depression (PHQ2-9) - Medium Risk (01/21/2024)  Financial Resource Strain: High Risk (07/24/2021)   Received from [facility]  Physical Activity: Inactive (02/18/2024)  Social Connections: Unknown (04/05/2022)   [facility] from [facility]  Stress: Stress Concern  Present (07/24/2021)   Received from [facility]  Tobacco Use: High Risk (03/04/2024)  Health Literacy: Adequate Health Literacy (02/18/2024)    Readmission Risk Interventions    12/21/2023    9:25 AM 12/09/2022    9:02 AM 12/01/2022    3:56 PM  Readmission Risk Prevention Plan  Transportation Screening Complete Complete Complete  HRI or Home Care Consult Complete    Social Work Consult for Recovery Care Planning/Counseling Complete    Palliative Care Screening Not Applicable    Medication Review Oceanographer) Complete Complete Complete  PCP or Specialist appointment within 3-5 days of discharge  Complete Complete  HRI or Home Care Consult  Complete Complete  SW Recovery Care/Counseling Consult  Complete Complete  Palliative Care Screening  Not Applicable Not Applicable  Skilled Nursing Facility  Not Applicable Not Applicable  SNF Comments   n/a

## 2024-08-05 NOTE — Progress Notes (Signed)
 PROGRESS NOTE  [name]  DOB: [birth year]  PCP: Tanda Bleacher, MD FMW:[id number]  DOA: 03/04/2024  LOS: 154 days  Hospital Day: 155  Brief narrative: [name] is a 57 y.o. male with PMH significant for homelessness, DM2, HTN, CHF, bipolar disorder, schizoaffective disorder, cocaine abuse, COPD Patient is a to the ED 03/04/24 for slower speech, confusion and right-sided weakness CT head showed left MCA infarct CTA head and neck revealed an M2 occlusion on the left,   Stroke workup was completed Echocardiogram revealed large mobile left ventricular thrombus on the left and right ventricular apical thrombus in the right, ejection fraction less than 20%.   Cardiology was consulted recommended DOAC. PT evaluated patient and recommended skilled nursing facility placement.  Patient was found to lack medical decision capacity/status and not amenable to determining his discharge disposition or leave AGAINST MEDICAL ADVICE.  TOC is pursuing guardianship with APS as he is lacking medical decision capacity. He did complain of halluacinations, eating poor, refused medications, has insomnia.   Subjective: Patient was seen and examined this morning. On wheelchair on the hallway. Not in distress.  No acute issues. Pending safe disposition plan  Assessment and plan: Acute CVA (cerebrovascular accident)  He underwent extensive workup and imaging revealed left MCA territory infarct, involving caudate nucleus, lentiform nucleus and internal capsule confirmed on MRI. TEE was noted for large mobile left lower clot just under the mitral valve with an EF of 20% and a small right apical clot with reduced RV systolic function. Psychiatry has deemed the patient to lack capacity; he continues to remain noncompliant at times. Unable to force meds.  Continue offering throughout the day as able.   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 Psych recommending APS guardianship, TOC in touch with APS regarding this as patient lacks medical capacity. Psych meds per psychiatry.  Currently on Latuda , gabapentin    Hyperlipidemia Continue Crestor  5 mg PO at bedtime.   Diabetes mellitus type 2 last A1c of 5.4. refuses monitoring  Ensure enlive PO bid  Gabapentin  200 mg PO bid    Polysubstance abuse Ativan  1 mg PO q6 hr PRN  Latuda  40 mg PO daily  Oxycodone  10 mg PO q3 hr PRN    Deconditioning: Ambulatory status complicated by previous right AKA and left BKA,  has no insurance coverage for SNF. refusing therapy at times. Meclizine  25 mg PO tid PRN    H/o COPD: Albuterol  q6 hr PRN nebs   H/o DVT: Xarelto  20 mg PO daily    Mobility:  PT Orders: Active   PT Follow up Rec: Home Health Pt (Followed By Outpt Pt For Continuing Prosthesis Training; [name] Manage Safely In An Accessible Living Space With Assist/Supervision For Medications, Meals)08/02/2024 1400   Goals of care   Code Status: Full Code     DVT prophylaxis:   rivaroxaban  (XARELTO ) tablet 20 mg   Antimicrobials: None currently Fluid: None Consultants: Psychiatry Family Communication: None at bedside  Status: Inpatient Level of care:  Med-Surg   Patient is from: Homeless Anticipated d/c to: Difficulty disposition.   Diet:  Diet Order  Diet regular Fluid consistency: Thin  Diet effective now           Diet - low sodium heart healthy                   Scheduled Meds:  carvedilol   10 mg Oral Daily   furosemide   40 mg Oral Daily   gabapentin    200 mg Oral BID   lisinopril   40 mg Oral Daily   lurasidone   40 mg Oral Q breakfast   rivaroxaban   20 mg Oral QHS   rosuvastatin   5 mg Oral QHS   spironolactone   25 mg Oral Daily    PRN meds: albuterol , feeding supplement, hydrALAZINE , labetalol , lactulose , LORazepam , meclizine , mouth rinse, oxyCODONE    Infusions:    Antimicrobials: Anti-infectives (From admission, onward)    None       Objective: Vitals:   08/03/24 2028 08/04/24 2100  BP: (!) 159/76 90/69  Pulse: 91 99  Resp: 18 18  Temp:    SpO2: 98% 100%   No intake or output data in the 24 hours ending 08/05/24 1459    Filed Weights   03/26/24 0431 03/28/24 0456 04/04/24 0500  Weight: 42.7 kg 47.5 kg 61.7 kg   Weight change:  Body mass index is 20.09 kg/m.   Physical Exam: General exam: Pleasant, calm, comfortable on wheelchair Skin: No rashes, lesions or ulcers. HEENT: Atraumatic, normocephalic, no obvious bleeding Lungs: Clear to auscultation bilaterally,  CVS: S1, S2, no murmur,   GI/Abd: Soft, nontender, nondistended, bowel sound present,   CNS: Alert, awake, Psychiatry: Mood appropriate Extremities: Right AKA, left BKA status  Data Review: I have personally reviewed the laboratory data and studies available.  F/u labs  Unresulted Labs (From admission, onward)    None      Signed, Chapman Rota, MD [facility] 08/05/2024

## 2024-08-05 NOTE — TOC Progression Note (Signed)
 Transition of Care [facility]) - Progression Note    Patient Details  Name: [name] MRN: [id number] Date of Birth: 1966/09/15  Transition of Care [facility]) CM/SW Contact  [name], [name] Phone Number: 08/05/2024, 3:48 PM  Clinical Narrative:   CSW met with patient and he reported that he was able to get in touch with someone about his lost SNAP card. CSW contacted his caregiver, [name], and confirmed that court date is scheduled for 9/8 at 10 am, information relayed to patient. Patient looking forward to the court date and said he wants to fire his G[employer]. CSW to follow.    Expected Discharge Plan: Skilled Nursing Facility Barriers to Discharge: English as a second language teacher, Active Substance Use - Placement, SNF Pending bed offer               Expected Discharge Plan and Services       Living arrangements for the past 2 months: Apartment Expected Discharge Date: 03/14/24                                     Social Drivers of Health (SDOH) Interventions SDOH Screenings   Food Insecurity: Food Insecurity Present (03/06/2024)  Housing: High Risk (03/06/2024)  Transportation Needs: No Transportation Needs (03/06/2024)  Utilities: Patient Declined (03/06/2024)  Alcohol Screen: Medium Risk (06/24/2019)  Depression (PHQ2-9): Low Risk  (02/18/2024)  Recent Concern: Depression (PHQ2-9) - Medium Risk (01/21/2024)  Financial Resource Strain: High Risk (07/24/2021)   Received from [facility]  Physical Activity: Inactive (02/18/2024)  Social Connections: Unknown (04/05/2022)   Received from [facility]  Stress: Stress Concern Present (07/24/2021)   Received from [facility]  Tobacco Use: High Risk (03/04/2024)  Health Literacy: Adequate Health Literacy (02/18/2024)    Readmission Risk Interventions    12/21/2023    9:25 AM 12/09/2022    9:02 AM 12/01/2022    3:56 PM  Readmission Risk Prevention Plan  Transportation Screening Complete Complete Complete  HRI or Home Care Consult  Complete    Social Work Consult for Recovery Care Planning/Counseling Complete    Palliative Care Screening Not Applicable    Medication Review Oceanographer) Complete Complete Complete  PCP or Specialist appointment within 3-5 days of discharge  Complete Complete  HRI or Home Care Consult  Complete Complete  SW Recovery Care/Counseling Consult  Complete Complete  Palliative Care Screening  Not Applicable Not Applicable  Skilled Nursing Facility  Not Applicable Not Applicable  SNF Comments   n/a

## 2024-08-06 DIAGNOSIS — I639 Cerebral infarction, unspecified: Secondary | ICD-10-CM | POA: Diagnosis not present

## 2024-08-06 NOTE — Progress Notes (Signed)
 PROGRESS NOTE  [name]  DOB: 02/24/1966  PCP: Tanda Bleacher, MD FMW:[id number]  DOA: 03/04/2024  LOS: 155 days  Hospital Day: 156  Brief narrative: [name] is a 58 y.o. male with PMH significant for homelessness, DM2, HTN, CHF, bipolar disorder, schizoaffective disorder, cocaine abuse, COPD Patient is a to the ED 03/04/24 for slower speech, confusion and right-sided weakness CT head showed left MCA infarct CTA head and neck revealed an M2 occlusion on the left,   Stroke workup was completed Echocardiogram revealed large mobile left ventricular thrombus on the left and right ventricular apical thrombus in the right, ejection fraction less than 20%.   Cardiology was consulted recommended DOAC. PT evaluated patient and recommended skilled nursing facility placement.  Patient was found to lack medical decision capacity/status and not amenable to determining his discharge disposition or leave AGAINST MEDICAL ADVICE.  TOC is pursuing guardianship with APS as he is lacking medical decision capacity. He did complain of halluacinations, eating poor, refused medications, has insomnia.   Subjective: Patient was seen and examined this morning. Sleeping on his bed.   Opens eyes on command.   Not in distress. Pending safe disposition plan No essential change in last several days  Assessment and plan: Acute CVA (cerebrovascular accident)  He underwent extensive workup and imaging revealed left MCA territory infarct, involving caudate nucleus, lentiform nucleus and internal capsule confirmed on MRI. TEE was noted for large mobile left lower clot just under the mitral valve with an EF of 20% and a small right apical clot with reduced RV systolic function. Psychiatry has deemed the patient to lack capacity; he continues to remain noncompliant at times. Unable to force meds.  Continue offering throughout the day as able.   Cocaine induced cardiomyopathy with a large multichamber  intracardiac thrombus: Hypertension Continue Coreg , Lasix , lisinopril , Aldactone , hydralazine , Xarelto   Last 2D echo showed an EF of less than 20%   Schizoaffective disorder / bipolar disorder H/o polysubstance abuse Lacks medical capacity: Psych was consulted and recommended that the patient lacks medical capacity to make decisions if the patient attempts to leave AGAINST MEDICAL ADVICE he should be involuntarily committed. Psych recommending APS guardianship, TOC in touch with APS regarding this as patient lacks medical capacity. Psych meds per psychiatry.  Currently on Latuda , gabapentin    Hyperlipidemia Continue Crestor  5 mg PO at bedtime.   Diabetes mellitus type 2 last A1c of 5.4. refuses monitoring  Ensure enlive PO bid  Gabapentin  200 mg PO bid    Polysubstance abuse Ativan  1 mg PO q6 hr PRN  Latuda  40 mg PO daily  Oxycodone  10 mg PO q3 hr PRN    Deconditioning: Ambulatory status complicated by previous right AKA and left BKA,  has no insurance coverage for SNF. refusing therapy at times. Meclizine  25 mg PO tid PRN    H/o COPD: Albuterol  q6 hr PRN nebs   H/o DVT: Xarelto  20 mg PO daily    Mobility:  PT Orders: Active   PT Follow up Rec: Home Health Pt (Followed By Outpt Pt For Continuing Prosthesis Training; [name] Manage Safely In An Accessible Living Space With Assist/Supervision For Medications, Meals)08/02/2024 1400   Goals of care   Code Status: Full Code     DVT prophylaxis:   rivaroxaban  (XARELTO ) tablet 20 mg   Antimicrobials: None currently Fluid: None Consultants: Psychiatry Family Communication: None at bedside  Status: Inpatient Level of care:  Med-Surg   Patient is from: Homeless Anticipated d/c to: Difficulty disposition.  Diet:  Diet Order             Diet regular Fluid consistency: Thin  Diet effective now           Diet - low sodium heart healthy                   Scheduled Meds:  carvedilol   10 mg Oral Daily    furosemide   40 mg Oral Daily   gabapentin   200 mg Oral BID   lisinopril   40 mg Oral Daily   lurasidone   40 mg Oral Q breakfast   rivaroxaban   20 mg Oral QHS   rosuvastatin   5 mg Oral QHS   spironolactone   25 mg Oral Daily    PRN meds: albuterol , feeding supplement, hydrALAZINE , labetalol , lactulose , LORazepam , meclizine , mouth rinse, oxyCODONE    Infusions:    Antimicrobials: Anti-infectives (From admission, onward)    None       Objective: Vitals:   08/05/24 1959 08/06/24 1042  BP: (!) 123/97 (!) 127/96  Pulse: 72 74  Resp:  18  SpO2: 100% 100%   No intake or output data in the 24 hours ending 08/06/24 1057    Filed Weights   03/26/24 0431 03/28/24 0456 04/04/24 0500  Weight: 42.7 kg 47.5 kg 61.7 kg   Weight change:  Body mass index is 20.09 kg/m.   Physical Exam: General exam: Pleasant, calm, comfortable on wheelchair Skin: No rashes, lesions or ulcers. HEENT: Atraumatic, normocephalic, no obvious bleeding Lungs: Clear to auscultation bilaterally,  CVS: S1, S2, no murmur,   GI/Abd: Soft, nontender, nondistended, bowel sound present,   CNS: Alert, awake, Psychiatry: Mood appropriate Extremities: Right AKA, left BKA status  Data Review: I have personally reviewed the laboratory data and studies available.  F/u labs  Unresulted Labs (From admission, onward)    None      Signed, Chapman Rota, MD [facility] 08/06/2024

## 2024-08-07 DIAGNOSIS — I639 Cerebral infarction, unspecified: Secondary | ICD-10-CM | POA: Diagnosis not present

## 2024-08-07 NOTE — Progress Notes (Signed)
 PROGRESS NOTE  [name]  DOB: 04-20-1966  PCP: Tanda Bleacher, MD FMW:[id number]  DOA: 03/04/2024  LOS: 156 days  Hospital Day: 157  Brief narrative: [name] is a 58 y.o. male with PMH significant for homelessness, DM2, HTN, CHF, bipolar disorder, schizoaffective disorder, cocaine abuse, COPD Patient is a to the ED 03/04/24 for slower speech, confusion and right-sided weakness CT head showed left MCA infarct CTA head and neck revealed an M2 occlusion on the left,   Stroke workup was completed Echocardiogram revealed large mobile left ventricular thrombus on the left and right ventricular apical thrombus in the right, ejection fraction less than 20%.   Cardiology was consulted recommended DOAC. PT evaluated patient and recommended skilled nursing facility placement.  Patient was found to lack medical decision capacity/status and not amenable to determining his discharge disposition or leave AGAINST MEDICAL ADVICE.  TOC is pursuing guardianship with APS as he is lacking medical decision capacity. He did complain of halluacinations, eating poor, refused medications, has insomnia.   Subjective: Patient was seen and examined this morning. Wheeling around on wheelchair.  Not in distress.  No medical update in last several days  Assessment and plan: Acute CVA (cerebrovascular accident)  He underwent extensive workup and imaging revealed left MCA territory infarct, involving caudate nucleus, lentiform nucleus and internal capsule confirmed on MRI. TEE was noted for large mobile left lower clot just under the mitral valve with an EF of 20% and a small right apical clot with reduced RV systolic function. Psychiatry has deemed the patient to lack capacity; he continues to remain noncompliant at times. Unable to force meds.  Continue offering throughout the day as able.   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 Psych recommending APS guardianship, TOC in touch with APS regarding this as patient lacks medical capacity. Psych meds per psychiatry.  Currently on Latuda , gabapentin    Hyperlipidemia Continue Crestor  5 mg PO at bedtime.   Diabetes mellitus type 2 last A1c of 5.4. refuses monitoring  Ensure enlive PO bid  Gabapentin  200 mg PO bid    Polysubstance abuse Ativan  1 mg PO q6 hr PRN  Latuda  40 mg PO daily  Oxycodone  10 mg PO q3 hr PRN    Deconditioning: Ambulatory status complicated by previous right AKA and left BKA,  has no insurance coverage for SNF. refusing therapy at times. Meclizine  25 mg PO tid PRN    H/o COPD: Albuterol  q6 hr PRN nebs   H/o DVT: Xarelto  20 mg PO daily    Mobility:  PT Orders: Active   PT Follow up Rec: Home Health Pt (Followed By Outpt Pt For Continuing Prosthesis Training; [name] Manage Safely In An Accessible Living Space With Assist/Supervision For Medications, Meals)08/02/2024 1400   Goals of care   Code Status: Full Code     DVT prophylaxis:   rivaroxaban  (XARELTO ) tablet 20 mg   Antimicrobials: None currently Fluid: None Consultants: Psychiatry Family Communication: None at bedside  Status: Inpatient Level of care:  Med-Surg   Patient is from: Homeless Anticipated d/c to: Difficulty disposition.   Diet:  Diet Order  Diet regular Fluid consistency: Thin  Diet effective now           Diet - low sodium heart healthy                   Scheduled Meds:  carvedilol   10 mg Oral Daily   furosemide   40 mg Oral Daily   gabapentin   200 mg Oral  BID   lisinopril   40 mg Oral Daily   lurasidone   40 mg Oral Q breakfast   rivaroxaban   20 mg Oral QHS   rosuvastatin   5 mg Oral QHS   spironolactone   25 mg Oral Daily    PRN meds: albuterol , feeding supplement, hydrALAZINE , labetalol , lactulose , LORazepam , meclizine , mouth rinse, oxyCODONE    Infusions:    Antimicrobials: Anti-infectives (From admission, onward)    None       Objective: Vitals:   08/07/24 0747 08/07/24 1127  BP: 119/68 133/82  Pulse: 68 88  Resp: 18 19  SpO2: 93% 100%   No intake or output data in the 24 hours ending 08/07/24 1157    Filed Weights   03/26/24 0431 03/28/24 0456 04/04/24 0500  Weight: 42.7 kg 47.5 kg 61.7 kg   Weight change:  Body mass index is 20.09 kg/m.   Physical Exam: General exam: Pleasant, calm, comfortable on wheelchair Skin: No rashes, lesions or ulcers. HEENT: Atraumatic, normocephalic, no obvious bleeding Lungs: Clear to auscultation bilaterally,  CVS: S1, S2, no murmur,   GI/Abd: Soft, nontender, nondistended, bowel sound present,   CNS: Alert, awake, Psychiatry: Mood appropriate Extremities: Right AKA, left BKA status  Data Review: I have personally reviewed the laboratory data and studies available.  F/u labs  Unresulted Labs (From admission, onward)    None      Signed, Chapman Rota, MD [facility] 08/07/2024

## 2024-08-08 DIAGNOSIS — I639 Cerebral infarction, unspecified: Secondary | ICD-10-CM | POA: Diagnosis not present

## 2024-08-08 NOTE — Progress Notes (Signed)
 OT Cancellation Note  Patient Details Name: [name] MRN: [id number] DOB: November 23, 1966   Cancelled Treatment:    Reason Eval/Treat Not Completed: Patient declined, no reason specified (Patient declined OT due to expected to leave for courtdate soon.)  [name] 08/08/2024, 11:17 AM  [name], OTA Acute Rehabilitation Services  Office [phone]

## 2024-08-08 NOTE — Progress Notes (Signed)
 PROGRESS NOTE  [name]  DOB: 28-Sep-1966  PCP: Tanda Bleacher, MD FMW:[id number]  DOA: 03/04/2024  LOS: 157 days  Hospital Day: 158  Brief narrative: [name] is a 58 y.o. male with PMH significant for homelessness, DM2, HTN, CHF, bipolar disorder, schizoaffective disorder, cocaine abuse, COPD Patient is a to the ED 03/04/24 for slower speech, confusion and right-sided weakness CT head showed left MCA infarct CTA head and neck revealed an M2 occlusion on the left,   Stroke workup was completed Echocardiogram revealed large mobile left ventricular thrombus on the left and right ventricular apical thrombus in the right, ejection fraction less than 20%.   Cardiology was consulted recommended DOAC. PT evaluated patient and recommended skilled nursing facility placement.  Patient was found to lack medical decision capacity/status and not amenable to determining his discharge disposition or leave AGAINST MEDICAL ADVICE.  TOC is pursuing guardianship with APS as he is lacking medical decision capacity. He did complain of halluacinations, eating poor, refused medications, has insomnia.   Subjective: Patient was seen and examined this morning. Wheeling around on wheelchair.   Not in distress.  Was on phone with somebody. No essential change in several days  Assessment and plan: Acute CVA (cerebrovascular accident)  He underwent extensive workup and imaging revealed left MCA territory infarct, involving caudate nucleus, lentiform nucleus and internal capsule confirmed on MRI. TEE was noted for large mobile left lower clot just under the mitral valve with an EF of 20% and a small right apical clot with reduced RV systolic function. Psychiatry has deemed the patient to lack capacity; he continues to remain noncompliant at times. Unable to force meds.  Continue offering throughout the day as able.   Cocaine induced cardiomyopathy with a large multichamber intracardiac  thrombus: Hypertension Continue Coreg , Lasix , lisinopril , Aldactone , hydralazine , Xarelto   Last 2D echo showed an EF of less than 20%   Schizoaffective disorder / bipolar disorder H/o polysubstance abuse Lacks medical capacity: Psych was consulted and recommended that the patient lacks medical capacity to make decisions if the patient attempts to leave AGAINST MEDICAL ADVICE he should be involuntarily committed. Psych recommending APS guardianship, TOC in touch with APS regarding this as patient lacks medical capacity. Psych meds per psychiatry.  Currently on Latuda , gabapentin    Hyperlipidemia Continue Crestor  5 mg PO at bedtime.   Diabetes mellitus type 2 last A1c of 5.4. refuses monitoring  Ensure enlive PO bid  Gabapentin  200 mg PO bid    Polysubstance abuse Ativan  1 mg PO q6 hr PRN  Latuda  40 mg PO daily  Oxycodone  10 mg PO q3 hr PRN    Deconditioning: Ambulatory status complicated by previous right AKA and left BKA,  has no insurance coverage for SNF. refusing therapy at times. Meclizine  25 mg PO tid PRN    H/o COPD: Albuterol  q6 hr PRN nebs   H/o DVT: Xarelto  20 mg PO daily    Mobility:  PT Orders: Active   PT Follow up Rec: Home Health Pt (Followed By Outpt Pt For Continuing Prosthesis Training; [name] Manage Safely In An Accessible Living Space With Assist/Supervision For Medications, Meals)08/02/2024 1400   Goals of care   Code Status: Full Code     DVT prophylaxis:   rivaroxaban  (XARELTO ) tablet 20 mg   Antimicrobials: None currently Fluid: None Consultants: Psychiatry Family Communication: None at bedside  Status: Inpatient Level of care:  Med-Surg   Patient is from: Homeless Anticipated d/c to: Difficulty disposition.   Diet:  Diet  Order             Diet regular Fluid consistency: Thin  Diet effective now           Diet - low sodium heart healthy                   Scheduled Meds:  carvedilol   10 mg Oral Daily    furosemide   40 mg Oral Daily   gabapentin   200 mg Oral BID   lisinopril   40 mg Oral Daily   lurasidone   40 mg Oral Q breakfast   rivaroxaban   20 mg Oral QHS   rosuvastatin   5 mg Oral QHS   spironolactone   25 mg Oral Daily    PRN meds: albuterol , feeding supplement, hydrALAZINE , labetalol , lactulose , LORazepam , meclizine , mouth rinse, oxyCODONE    Infusions:    Antimicrobials: Anti-infectives (From admission, onward)    None       Objective: Vitals:   08/07/24 0747 08/07/24 1127  BP: 119/68 133/82  Pulse: 68 88  Resp: 18 19  SpO2: 93% 100%   No intake or output data in the 24 hours ending 08/08/24 1412    Filed Weights   03/26/24 0431 03/28/24 0456 04/04/24 0500  Weight: 42.7 kg 47.5 kg 61.7 kg   Weight change:  Body mass index is 20.09 kg/m.   Physical Exam: General exam: Pleasant, calm, comfortable on wheelchair Skin: No rashes, lesions or ulcers. HEENT: Atraumatic, normocephalic, no obvious bleeding Lungs: Clear to auscultation bilaterally,  CVS: S1, S2, no murmur,   GI/Abd: Soft, nontender, nondistended, bowel sound present,   CNS: Alert, awake, Psychiatry: Mood appropriate Extremities: Right AKA, left BKA status  Data Review: I have personally reviewed the laboratory data and studies available.  F/u labs  Unresulted Labs (From admission, onward)    None      Signed, Chapman Rota, MD [facility] 08/08/2024

## 2024-08-08 NOTE — TOC Progression Note (Signed)
 Transition of Care [facility]) - Progression Note    Patient Details  Name: [name] MRN: [id number] Date of Birth: 03-03-1966  Transition of Care [facility]) CM/SW Contact  [name], [name] Phone Number: 08/08/2024, 3:14 PM  Clinical Narrative:   CSW spoke with patient and caregiver, [name], after court hearing today. Patient's guardianship case with dismissed; judge did not feel that patient was incompetent and would not take his rights away. Patient very happy about the result of the court hearing. Patient would like to find new housing, as he was evicted from his previous apartment. Patient refusing a group home, SNF, or ALF, says he will be able to manage himself in a boarding house or a shelter. Patient says he receives about $900 a month, but he can't afford to spend all of it on housing. Patient is adamant that he does not want housing in [place], [place], or [address], asking for [employer]. CSW discussed concern about [place] and lack of accessibility for patient, and patient in agreement to trying for [name]. Patient says he wants a new start away from things he already knows.  CSW attempted to reach [name], [place], and [facility] per patient's request to provide update and discuss assistance that can be provided for the patient, left voicemails for everyone. Awaiting call back. CSW to follow.    Expected Discharge Plan: Home/Self Care Barriers to Discharge: Homeless with medical needs, Financial Resources               Expected Discharge Plan and Services       Living arrangements for the past 2 months: Apartment Expected Discharge Date: 03/14/24                                     Social Drivers of Health (SDOH) Interventions SDOH Screenings   Food Insecurity: Food Insecurity Present (03/06/2024)  Housing: High Risk (03/06/2024)  Transportation Needs: No Transportation Needs (03/06/2024)  Utilities: Patient Declined  (03/06/2024)  Alcohol Screen: Medium Risk (06/24/2019)  Depression (PHQ2-9): Low Risk  (02/18/2024)  Recent Concern: Depression (PHQ2-9) - Medium Risk (01/21/2024)  Financial Resource Strain: High Risk (07/24/2021)   Received from [facility]  Physical Activity: Inactive (02/18/2024)  Social Connections: Unknown (04/05/2022)   Received from [facility]  Stress: Stress Concern Present (07/24/2021)   Received from [facility]  Tobacco Use: High Risk (03/04/2024)  Health Literacy: Adequate Health Literacy (02/18/2024)    Readmission Risk Interventions    12/21/2023    9:25 AM 12/09/2022    9:02 AM 12/01/2022    3:56 PM  Readmission Risk Prevention Plan  Transportation Screening Complete Complete Complete  HRI or Home Care Consult Complete    Social Work Consult for Recovery Care Planning/Counseling Complete    Palliative Care Screening Not Applicable    Medication Review [employer]) Complete Complete Complete  PCP or Specialist appointment within 3-5 days of discharge  Complete Complete  HRI or Home Care Consult  Complete Complete  SW Recovery Care/Counseling Consult  Complete Complete  Palliative Care Screening  Not Applicable Not Applicable  Skilled Nursing Facility  Not Applicable Not Applicable  SNF Comments   n/a

## 2024-08-08 NOTE — Plan of Care (Signed)
  Problem: Nutrition: Goal: Adequate nutrition will be maintained Outcome: Progressing   Problem: Coping: Goal: Level of anxiety will decrease Outcome: Progressing   Problem: Elimination: Goal: Will not experience complications related to bowel motility Outcome: Progressing Goal: Will not experience complications related to urinary retention Outcome: Progressing   Problem: Pain Managment: Goal: General experience of comfort will improve and/or be controlled Outcome: Progressing   Problem: Safety: Goal: Ability to remain free from injury will improve Outcome: Progressing   Problem: Skin Integrity: Goal: Risk for impaired skin integrity will decrease Outcome: Progressing

## 2024-08-09 DIAGNOSIS — I639 Cerebral infarction, unspecified: Secondary | ICD-10-CM | POA: Diagnosis not present

## 2024-08-09 NOTE — Progress Notes (Signed)
 Physical Therapy Treatment Patient Details Name: [name] MRN: [id number] DOB: [birth year] Today's Date: 08/09/2024   History of Present Illness Patient is 58 y.o. male presented to ED on 03/04/24 with stroke like symptoms when caregiver found him with slurred speech and right sided weakness. Head CT reveals Acute left MCA territory infarct affecting the insula, caudate nucleus, lentiform nucleus and internal capsule. As of August, pt has been working toward getting and fitting for a transfemoral prosthesis; PMH significant of  Schizoaffective disorder, heart failure with reduced ejection fraction, pretension, DMII, COPD, s/p bilateral amputations, chronic pain, and polysubstance abuse.    PT Comments  Continuing work on functional mobility and activity tolerance; [name] seemed to be in better spirits today; he sounded happy with the court ruling in his case; he describes wanting to start new[place] a [place];  Today he was much more open to education regarding prosthetic training and gait training; We discussed how he will need to start off in the parallel bars for standing weight shifts in all directions to get used to standing with both prostheses; will need to learn and get used to the position of his right leg that will keep the prosthetic knee stable, and the position that will allow the knee to bend; will need to take the first step with both prostheses in the parallel bars for stability and safety; after that, this PT is willing to try forearm and axillary crutches; I emphasized to [name] that he will need to work on hip flexor stretching and hip extensor strengthening for more upright posture;  Safest practice would be to initially for crutch training with two person assist; while [name] prefers to work with just one therapist at a time, I emphasized this is for safety, and I am hopeful that [name] will be more open if we work with another therapist who he is familiar  with;  [name] told me he has not worked on the exercises given to him last Wednesday, but he would like another printout of those exercises;  We are planning to meet with [name], [facility] tomorrow at noon to get his right prosthesis, and begin the work of gait training; [name] and I made a tentative start time at 11:30 AM to go over his exercises; I would also like to go over how to use the legrests of his wheelchair; When he uses his wheelchair with prostheses on, there is a risk of his foot/feet catching on the ground     If plan is discharge home, recommend the following: Supervision due to cognitive status;Direct supervision/assist for financial management;Direct supervision/assist for medications management;Assist for transportation   Can travel by private vehicle        Equipment Recommendations  Other (comment) (will consider loftstrand crutches vs axiallary crutches vs RW)    Recommendations for Other Services       Precautions / Restrictions Precautions Precautions: Fall Recall of Precautions/Restrictions: Impaired Precaution/Restrictions Comments: h/o BLE amputations Required Braces or Orthoses: Other Brace Other Brace: L prosthesis; and R prosthesis with Prosthetist present to chick initial socket fit Restrictions Weight Bearing Restrictions Per Provider Order: No     Mobility  Bed Mobility Overal bed mobility: Modified Independent             General bed mobility comments: OOB in wheelchair                Wheelchair Mobility Independent; will need to review use of legrests  Tilt Bed    Modified Rankin (Stroke Patients Only) Modified Rankin (Stroke Patients Only) Pre-Morbid Rankin Score: Slight disability Modified Rankin: Moderate disability     Balance Overall balance assessment: Mild deficits observed, not formally tested                                          Communication Communication Communication: Impaired Factors  Affecting Communication: Difficulty expressing self;Reduced clarity of speech  Cognition Arousal: Alert Behavior During Therapy: Flat affect                             Following commands: Intact Following commands impaired: Follows multi-step commands with increased time    Cueing Cueing Techniques: Verbal cues, Visual cues  Exercises      General Comments  [name]'s wheelchair legrests are in his room; will need to review use of legrests for when he uses his wheelchair with his prostheses on       Pertinent Vitals/Pain Pain Assessment Pain Assessment: No/denies pain Pain Intervention(s): Monitored during session    Home Living                          Prior Function            PT Goals (current goals can now be found in the care plan section) Acute Rehab PT Goals PT Goal Formulation: With patient Time For Goal Achievement: 08/30/24 Potential to Achieve Goals: Fair Progress towards PT goals: Progressing toward goals    Frequency    Min 1X/week      PT Plan      Co-evaluation              AM-PAC PT 6 Clicks Mobility   Outcome Measure  Help needed turning from your back to your side while in a flat bed without using bedrails?: None Help needed moving from lying on your back to sitting on the side of a flat bed without using bedrails?: None Help needed moving to and from a bed to a chair (including a wheelchair)?: A Little Help needed standing up from a chair using your arms (e.g., wheelchair or bedside chair)?: A Lot Help needed to walk in hospital room?: Total Help needed climbing 3-5 steps with a railing? : Total 6 Click Score: 15    End of Session   Activity Tolerance: Patient tolerated treatment well     PT Visit Diagnosis: Other abnormalities of gait and mobility (R26.89);Muscle weakness (generalized) (M62.81);Other symptoms and signs involving the nervous system (R29.898);Hemiplegia and hemiparesis Hemiplegia -  Right/Left: Right Hemiplegia - dominant/non-dominant: Dominant Hemiplegia - caused by: Cerebral infarction     Time: 1240-1313 PT Time Calculation (min) (ACUTE ONLY): 33 min  Charges:    $Self Care/Home Management: 23-37 PT General Charges $$ ACUTE PT VISIT: 1 Visit                     [name], PT  Acute Rehabilitation Services Office [phone] Secure Chat welcomed    [name] 08/09/2024, 2:59 PM

## 2024-08-09 NOTE — Progress Notes (Signed)
 Occupational Therapy Treatment Patient Details Name: [name] MRN: [id number] DOB: 02/25/1966 Today's Date: 08/09/2024   History of present illness Patient is 58 y.o. male presented to ED on 03/04/24 with stroke like symptoms when caregiver found him with slurred speech and right sided weakness. Head CT reveals Acute left MCA territory infarct affecting the insula, caudate nucleus, lentiform nucleus and internal capsule. As of August, pt has been working toward getting and fitting for a transfemoral prosthesis; PMH significant of  Schizoaffective disorder, heart failure with reduced ejection fraction, pretension, DMII, COPD, s/p bilateral amputations, chronic pain, and polysubstance abuse.   OT comments  Patient continues to require supervision for tub bench transfer and bathing for safety.  Patient able to bathe self seated in wheelchair with assistance for pants.  OT to address donning prosthesis in future session. Discharge recommendations continue to be appropriate.       If plan is discharge home, recommend the following:  A little help with walking and/or transfers;A little help with bathing/dressing/bathroom;Supervision due to cognitive status;Direct supervision/assist for financial management;Direct supervision/assist for medications management   Equipment Recommendations  None recommended by OT    Recommendations for Other Services      Precautions / Restrictions Precautions Precautions: Fall Recall of Precautions/Restrictions: Impaired Precaution/Restrictions Comments: h/o BLE amputations Required Braces or Orthoses: Other Brace Other Brace: L prosthesis; and R prosthesis with Prosthetist present to chick initial socket fit Restrictions Weight Bearing Restrictions Per Provider Order: No       Mobility Bed Mobility Overal bed mobility: Modified Independent             General bed mobility comments: OOB in wheelchair    Transfers Overall transfer level: Needs  assistance Equipment used: None Transfers: Bed to chair/wheelchair/BSC            Lateral/Scoot Transfers: Supervision General transfer comment: to/from tub bench from/to wheelchair     Balance Overall balance assessment: Mild deficits observed, not formally tested                                         ADL either performed or assessed with clinical judgement   ADL Overall ADL's : Needs assistance/impaired         Upper Body Bathing: Supervision/ safety;Sitting Upper Body Bathing Details (indicate cue type and reason): in shower Lower Body Bathing: Supervison/ safety;Sitting/lateral leans Lower Body Bathing Details (indicate cue type and reason): distant supervision Upper Body Dressing : Set up;Sitting Upper Body Dressing Details (indicate cue type and reason): paper scrub top Lower Body Dressing: Minimal assistance;Sitting/lateral leans Lower Body Dressing Details (indicate cue type and reason): patient attempting to pull up with lateral leaning but unable to complete         Tub/ Shower Transfer: Supervision/safety;Tub transfer;Tub bench Tub/Shower Transfer Details (indicate cue type and reason): supervision for safety Functional mobility during ADLs: Wheelchair;Modified independent      Extremity/Trunk Assessment              Vision       Perception     Praxis     Communication Communication Communication: Impaired Factors Affecting Communication: Difficulty expressing self;Reduced clarity of speech   Cognition Arousal: Alert Behavior During Therapy: Flat affect Cognition: Cognition impaired         Attention impairment (select first level of impairment): Selective attention Executive functioning impairment (select all impairments): Problem solving, Reasoning  Following commands: Intact Following commands impaired: Follows multi-step commands with increased time      Cueing   Cueing Techniques:  Verbal cues, Visual cues  Exercises      Shoulder Instructions       General Comments      Pertinent Vitals/ Pain       Pain Assessment Pain Assessment: No/denies pain Pain Intervention(s): Monitored during session  Home Living                                          Prior Functioning/Environment              Frequency  Min 1X/week        Progress Toward Goals  OT Goals(current goals can now be found in the care plan section)  Progress towards OT goals: Progressing toward goals  Acute Rehab OT Goals Patient Stated Goal: to be discharged from hospital OT Goal Formulation: With patient Time For Goal Achievement: 08/16/24 Potential to Achieve Goals: Fair ADL Goals Pt Will Perform Lower Body Bathing: with mod assist;sitting/lateral leans Pt Will Perform Lower Body Dressing: with modified independence;sitting/lateral leans Pt Will Transfer to Toilet: with modified independence;bedside commode (drop arm BSC, no cues for safety) Pt Will Perform Toileting - Clothing Manipulation and hygiene: with mod assist;sitting/lateral leans Pt Will Perform Tub/Shower Transfer: Tub transfer;tub bench;with modified independence Additional ADL Goal #1: pt will don prosthesis mod I Additional ADL Goal #2: pt will perform squat pivot with prosthesis donned with mod I Additional ADL Goal #3: Pt will demonstrate proper safety with transfers/mobility 100% of the time during session. Additional ADL Goal #4: Pt will demonstrate appropriate safety awareness 100% of the time when positioning w/c to transfer  Plan      Co-evaluation                 AM-PAC OT 6 Clicks Daily Activity     Outcome Measure   Help from another person eating meals?: None Help from another person taking care of personal grooming?: A Little Help from another person toileting, which includes using toliet, bedpan, or urinal?: A Little Help from another person bathing (including washing,  rinsing, drying)?: A Little Help from another person to put on and taking off regular upper body clothing?: A Little Help from another person to put on and taking off regular lower body clothing?: A Little 6 Click Score: 19    End of Session Equipment Utilized During Treatment: Other (comment) (wheelchair and tub bench)  OT Visit Diagnosis: Unsteadiness on feet (R26.81);Other abnormalities of gait and mobility (R26.89);Muscle weakness (generalized) (M62.81);Other symptoms and signs involving the nervous system (R29.898);Other symptoms and signs involving cognitive function;Hemiplegia and hemiparesis Hemiplegia - Right/Left: Right Hemiplegia - dominant/non-dominant: Dominant Hemiplegia - caused by: Cerebral infarction   Activity Tolerance Patient tolerated treatment well   Patient Left in chair;with call bell/phone within reach   Nurse Communication Mobility status        Time: 8687-8654 OT Time Calculation (min): 33 min  Charges: OT General Charges $OT Visit: 1 Visit OT Treatments $Self Care/Home Management : 23-37 mins  [name], OTA Acute Rehabilitation Services  Office [phone]   [name] 08/09/2024, 1:57 PM

## 2024-08-09 NOTE — Progress Notes (Signed)
 PROGRESS NOTE  [name]  DOB: 10-30-1966  PCP: [name] Bleacher, MD FMW:[id number]  DOA: 03/04/2024  LOS: 158 days  Hospital Day: 159  Brief narrative: [name] is a 58 y.o. male with PMH significant for homelessness, DM2, HTN, CHF, bipolar disorder, schizoaffective disorder, cocaine abuse, COPD Patient is a to the ED 03/04/24 for slower speech, confusion and right-sided weakness CT head showed left MCA infarct CTA head and neck revealed an M2 occlusion on the left,   Stroke workup was completed Echocardiogram revealed large mobile left ventricular thrombus on the left and right ventricular apical thrombus in the right, ejection fraction less than 20%.   Cardiology was consulted recommended DOAC. PT evaluated patient and recommended skilled nursing facility placement.  Patient was found to lack medical decision capacity/status and not amenable to determining his discharge disposition or leave AGAINST MEDICAL ADVICE.  TOC is pursuing guardianship with APS as he is lacking medical decision capacity. He did complain of halluacinations, eating poor, refused medications, has insomnia.   Subjective: Patient was seen and examined this morning. On wheelchair.  Not in distress.  Alert, awake, RN concerned this morning about blood pressure being low in the 90s and his few medicines were held.  Assessment and plan: Acute CVA (cerebrovascular accident)  He underwent extensive workup and imaging revealed left MCA territory infarct, involving caudate nucleus, lentiform nucleus and internal capsule confirmed on MRI. TEE was noted for large mobile left lower clot just under the mitral valve with an EF of 20% and a small right apical clot with reduced RV systolic function. Psychiatry has deemed the patient to lack capacity; he continues to remain noncompliant at times. Unable to force meds.  Continue offering throughout the day as able.   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 Blood pressure in 90s this morning.  Okay to give Coreg .  I will hold others. Continue anticoagulation with Xarelto    Schizoaffective disorder / bipolar disorder H/o polysubstance abuse Lacks medical capacity: Psych was consulted and recommended that the patient lacks medical capacity to make decisions if the patient attempts to leave AGAINST MEDICAL ADVICE he should be involuntarily committed. Psych recommending APS guardianship, TOC in touch with APS regarding this as patient lacks medical capacity. Psych meds per psychiatry.  Currently on Latuda , gabapentin    Hyperlipidemia Continue Crestor  5 mg PO at bedtime.   Diabetes mellitus type 2 last A1c of 5.4. refuses monitoring  Ensure enlive PO bid  Gabapentin  200 mg PO bid    Polysubstance abuse Ativan  1 mg PO q6 hr PRN  Latuda  40 mg PO daily  Oxycodone  10 mg PO q3 hr PRN    Deconditioning: Ambulatory status complicated by previous right AKA and left BKA,  has no insurance coverage for SNF. refusing therapy at times. Meclizine  25 mg PO tid PRN    H/o COPD: Albuterol  q6 hr PRN nebs   H/o DVT: Xarelto  20 mg PO daily    Mobility:  PT Orders: Active   PT Follow up Rec: Home Health Pt (Followed By Outpt Pt For Continuing Prosthesis Training; [name])08/02/2024 1400   Goals of care   Code Status: Full Code     DVT prophylaxis:   rivaroxaban  (XARELTO ) tablet 20 mg   Antimicrobials: None currently Fluid: None Consultants: Psychiatry Family Communication: None  at bedside  Status: Inpatient Level of care:  Med-Surg   Patient is from: Homeless Anticipated d/c to: Difficulty disposition.   Diet:  Diet Order             Diet regular Fluid consistency: Thin  Diet effective  now           Diet - low sodium heart healthy                   Scheduled Meds:  carvedilol   10 mg Oral Daily   furosemide   40 mg Oral Daily   gabapentin   200 mg Oral BID   lisinopril   40 mg Oral Daily   lurasidone   40 mg Oral Q breakfast   rivaroxaban   20 mg Oral QHS   rosuvastatin   5 mg Oral QHS   spironolactone   25 mg Oral Daily    PRN meds: albuterol , feeding supplement, hydrALAZINE , labetalol , lactulose , LORazepam , meclizine , mouth rinse, oxyCODONE    Infusions:    Antimicrobials: Anti-infectives (From admission, onward)    None       Objective: Vitals:   08/09/24 0851 08/09/24 1048  BP: 91/64 93/77  Pulse:  74  Resp: 18 18  SpO2: 100% 100%   No intake or output data in the 24 hours ending 08/09/24 1442    Filed Weights   03/26/24 0431 03/28/24 0456 04/04/24 0500  Weight: 42.7 kg 47.5 kg 61.7 kg   Weight change:  Body mass index is 20.09 kg/m.   Physical Exam: General exam: Pleasant, calm, comfortable on wheelchair Skin: No rashes, lesions or ulcers. HEENT: Atraumatic, normocephalic, no obvious bleeding Lungs: Clear to auscultation bilaterally,  CVS: S1, S2, no murmur,   GI/Abd: Soft, nontender, nondistended, bowel sound present,   CNS: Alert, awake, Psychiatry: Mood appropriate Extremities: Right AKA, left BKA status  Data Review: I have personally reviewed the laboratory data and studies available.  F/u labs  Unresulted Labs (From admission, onward)     Start     Ordered   08/10/24 0500  CBC with Differential/Platelet  Tomorrow morning,   R       Question:  Specimen collection method  Answer:  Lab=Lab collect   08/09/24 1148   08/10/24 0500  Basic metabolic panel with GFR  Tomorrow morning,   R       Question:  Specimen collection method  Answer:  Lab=Lab collect   08/09/24 1148           Signed, Chapman Rota, MD [facility] 08/09/2024

## 2024-08-09 NOTE — Consult Note (Addendum)
 [facility] Follow-up  Patient Name: [name]  MRN: [id number]  DOB: Feb 29, 1966  Consult Order details:  Orders (From admission, onward)     Start     Ordered   06/28/24 1642  IP CONSULT TO PSYCHIATRY       Ordering Provider: Elpidio Reyes DEL, MD  Provider:  (Not yet assigned)  Question Answer Comment  Location [facility]   Reason for Consult? Question about improved capacity now that patient has been receiving treatment in house.  Patient's attorney requested repeat psych consult for possible discharge planning      06/28/24 1642   04/05/24 1343  IP CONSULT TO PSYCHIATRY       Ordering Provider: Judeth Trenda BIRCH, MD  Provider:  (Not yet assigned)  Question Answer Comment  Location [facility]   Reason for Consult? Acute stroke, embolic from large intracardiac clots, refusing Eliquis .  Schizoaffective d/o, lacks MDM capacity, has consistently refused to take Latuda .  ?  parenteral/IM, possibly long-acting [facility] medications.      04/05/24 1346   03/30/24 1323  IP CONSULT TO PSYCHIATRY       Comments: 34M, lived alone, B/L amputee, wheelchair, P/W acute left MCA stroke, large cardiac thrombus, through course of hospital admission with issues of agitation, refusal of care, refusing SNF, felt unsafe to DC home and documentation regarding lack of capacity (however did not specifically see this addressed in multiple psychiatry evaluations), currently medically stable awaiting disposition. APS contacted.  Kindly refer to [facility] MDs note from 4/30 regarding need for capacity evaluation.  Ordering Provider: Judeth Trenda BIRCH, MD  Provider:  (Not yet assigned)  Question Answer Comment  Location [facility]   Reason for Consult? Kindly assist with capacity evaluation> details in comments.      03/30/24 1323   03/14/24 1515  IP CONSULT TO PSYCHIATRY       Ordering Provider: Elpidio Reyes DEL, MD  Provider:  (Not yet  assigned)  Question Answer Comment  Location [facility]   Reason for Consult? Capacity evaluation -- patient verbally abusive, wants to go home, but oriented only to self.  Lives alone, seems to be unable to care for himself at home.  Essentially refusing all cares here      03/14/24 1514             Mode of Visit: In person    Psychiatry Consult Evaluation  Service Date: August 09, 2024 LOS:  LOS: 158 days  Chief Complaint being a free man   Primary Psychiatric Diagnoses  Schizoaffective disorder unspecified 2.  Stimulant use disorder (cocaine), in controlled environment  Assessment  [name] is a 58 y.o. male admitted: Medically on 03/04/2024  2:31 PM for residual stroke with prolonged hospitalization due to inability to care for self. 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   Seen numerous times by psychiatry at this point. Case summary and conceptualization:  Middle age male with significant substance use issues (possible schizoaffective disorder), apparent medication non-compliance, and severe medical problems (HF w EF < 20 and repeat CVA's with large current LA thrombus). Patient demonstrated fair cognition during the eval but it was decided at the time that patient lacked decision making capacity and patient  has been appointed a guardian ad litem and required a competency hearing. Psychiatry signed off.  Psychiatry consulted again on 8/3 to address medication non-compliance. There was concern noted for psychotic symptoms. Started on Latuda  40 mg daily by Dr. Sable covering the consult service. The patient has tolerated the medication well. The apparent psychotic symptoms could have had some  behavioral basis. Regardless the patient stopped having these symptoms shortly after starting Latuda .  He was determined by the court on 9/8 to be competent.  08/09/24: Patient evaluated in wheelchair. He's irritable but overall doing well. Patient was determined to be competent yesterday at court hearing and patient was overjoyed by this. Patient plans to go to [place] and [name] is aiding with housing. He is planning to obtain his social security check in next day or 2 and use it for housing. He denies any acute safety concerns including SI/HI/AVH. He is very enthusiastic to discharge. He appeared to have medical decision making capacity today. Psych signing off. Encouraged to continue lurasidone  as it does seem to help with his mood lability and psychosis.     Diagnoses:  Active Hospital problems: Principal Problem:   Acute CVA (cerebrovascular accident) [facility]) Active Problems:   Schizoaffective disorder (HCC)   Cocaine abuse (HCC)   Type 2 diabetes mellitus (HCC)   Prolonged QT interval   Acid reflux   History of DVT (deep vein thrombosis)   Status post bilateral BKA.   COPD (chronic obstructive pulmonary disease) (HCC)   Chronic systolic CHF (congestive heart failure) (HCC)   Benign essential HTN   LV (left ventricular) mural thrombus   Elevated liver enzymes   Dilated cardiomyopathy (HCC)    Plan   ## Psychiatric Medication Recommendations:  --Continue Latuda  40 mg every morning with breakfast --Continue Gabapentin  200 mg twice daily   ## Medical Decision Making Capacity: appears to have medical decision making capacity today  ## Further Work-up:  -- Latuda  has very low propensity to prolong QT -- given long QT -> optimize K 4.0, Mg 2.0, and benefit-risk with all QT prolonging meds -- most recent EKG on 06/29/2024 had QtC of 511  -- Pertinent labwork reviewed earlier this admission   ## Disposition:-- There are no psychiatric contraindications to discharge at this  time. Patient determined to be competent per court hearing yesterday.   ## Behavioral / Environmental: - No specific recommendations at this time.     ## Safety and Observation Level:  - Based on my clinical evaluation, I estimate the patient to be at low risk of self harm in the current setting. - At this time, we recommend  routine. This decision is based on my review of the chart including patient's history and current presentation, interview of the patient, mental status examination, and consideration of suicide risk including evaluating suicidal ideation, plan, intent, suicidal or self-harm behaviors, risk factors, and protective factors. This judgment is based on our ability to directly address suicide risk, implement suicide prevention strategies, and develop a safety plan while the patient is in the clinical setting. Please contact our team if there is a concern that risk level has changed.  CSSR Risk Category:C-SSRS RISK CATEGORY: No Risk  Suicide Risk Assessment: Patient has following modifiable risk factors for suicide: medication noncompliance, which we are addressing by competency evaluation to get pt guardian and access to resources. Patient has following non-modifiable or demographic risk factors for suicide: male gender, history of suicide attempt, and psychiatric hospitalization Patient has the following protective factors  against suicide: in controlled environment and not currently experiencing any acute psychiatric concerns.   Thank you for this consult request. Recommendations have been communicated to the primary team.  We will sign off.     History of Present Illness  Relevant Aspects of [facility] Course:  Admitted on 03/04/2024 for strokelike symptoms.  Patient was diagnosed with left MCA territory stroke which involve numerous structures.  Patient had residual deficits including right-sided weakness and cognitive impairment. Echo showing cardiac clot.  Psych ROS:   Depression: Does not describe depressive symptoms.  Denies suicidal thoughts. Anxiety: Did not describe significant anxiety symptoms. Mania (lifetime and current): Does not describe recent manic symptoms. Psychosis: (lifetime and current): Endorses some symptoms of psychosis outside of cocaine use in the past but denies any symptoms psychosis since August when he was started on lurasidone . Not being on cocaine likely is also reducing his risk for psychosis.   Psychiatric and Social History  Psychiatric History:  Information collected from patient and chart  Prev Dx/Sx: Schizoaffective disorder Current Psych Provider: ACT team Home Meds (current): Abilify  5 mg twice a day and hydroxyzine  25 mg every 6 hours as needed Previous Med Trials: Multiple trials Therapy: Currently followed up ACT team   Prior Psych Hospitalization: Last hospitalization was at [facility] in October 2024 Prior Self Harm: History of suicidal behavior Prior Violence: History of agitation and carrying knife/scapel   Family Psych History: Unknown Family Hx suicide: Unknown  Social History:  Not discussed during this encounter.  Not much information per chart review.  Patient did appear to live at her apartment alone prior to current admission.  Patient is followed by ACT team. Access to weapons/lethal means: Denies  Substance History Cocaine use disorder and hx of rehab. Also history of alc use but w/o complications. No other pertinent substance hx.   Exam Findings  Physical Exam:  Vital Signs:  Pulse Rate:  [74] 74 (09/09 1048) Resp:  [18] 18 (09/09 1048) BP: (91-93)/(64-77) 93/77 (09/09 1048) SpO2:  [100 %] 100 % (09/09 1048) Blood pressure 93/77, pulse 74, temperature (!) 97.5 F (36.4 C), temperature source Oral, resp. rate 18, height 5' 9 (1.753 m), weight 61.7 kg, SpO2 100%. Body mass index is 20.09 kg/m.  Physical Exam Vitals and nursing note reviewed.  HENT:     Head: Normocephalic and atraumatic.   Pulmonary:     Effort: Pulmonary effort is normal.  Musculoskeletal:     Comments: Bilateral lower extremity amputations.  Neurological:     General: No focal deficit present.     Mental Status: He is alert.     Mental Status Exam: General Appearance: Casual and Fairly Groomed.   Orientation:  Full (Time, Place, and Person)  Memory:  Recent;   Good Remote;   Fair  Concentration:  Concentration: Good  Recall:  Good  Attention  Fair  Eye Contact:  Good  Speech:  nml  Language:  Fair  Volume:  Normal  Mood: fine  Affect:  Constricted.   Thought Process:  logical  Thought Content: nml  Suicidal Thoughts:  No  Homicidal Thoughts:  No  Judgement:  Fair  Insight:  Fair  Psychomotor Activity:  Normal  Akathisia:  Negative  Fund of Knowledge:  Fair, lower end of normal. Unclear if chronic or since his CVA.       Assets:  Communication Skills Desire for Improvement Financial Resources/Insurance Leisure Time  Cognition: On brief assessment intact executive functioning.  ADL's:  Intact, subjective  per patient  AIMS (if indicated):        Other History   These have been pulled in through the EMR, reviewed, and updated if appropriate.  Family History:  The patient's family history includes Drug abuse in his mother; Suicidality in his mother.  Medical History: Past Medical History:  Diagnosis Date   Asthma    Bipolar 1 disorder (HCC)    CHF (congestive heart failure) (HCC)    COPD (chronic obstructive pulmonary disease) (HCC)    Depression    Diabetes mellitus without complication (HCC)    GERD (gastroesophageal reflux disease)    Homelessness    Hypertension    QT prolongation    Schizoaffective disorder (HCC)     Surgical History: Past Surgical History:  Procedure Laterality Date   AMPUTATION Left 12/10/2022   Procedure: LEFT BELOW KNEE AMPUTATION;  Surgeon: Harden Jerona GAILS, MD;  Location: [facility] OR;  Service: Orthopedics;  Laterality: Left;   AMPUTATION TOE  Bilateral 01/02/2021   Procedure: AMPUTATION TOE;  Surgeon: Gretel Ozell PARAS, DPM;  Location: [facility] OR;  Service: Podiatry;  Laterality: Bilateral;  Ok to leave patient on hospital bed   IR ANGIO INTRA EXTRACRAN SEL COM CAROTID INNOMINATE BILAT MOD SED  12/17/2020   IR ANGIO VERTEBRAL SEL VERTEBRAL BILAT MOD SED  12/17/2020   MULTIPLE EXTRACTIONS WITH ALVEOLOPLASTY N/A 03/25/2023   Procedure: IRRIGATION AND DEBRIDEMENT, MULTIPLE EXTRACTION WITH ALVEOLOPLASTY X 4 , EXTRACTIONS 4 THROUGH 13, 20 THROUGHT 29;  Surgeon: Joanette Soulier, DMD;  Location: [facility] OR;  Service: Oral Surgery;  Laterality: N/A;   RADIOLOGY WITH ANESTHESIA N/A 12/17/2020   Procedure: IR WITH ANESTHESIA;  Surgeon: Radiologist, Medication, MD;  Location: [facility] OR;  Service: Radiology;  Laterality: N/A;     Medications:   Current Facility-Administered Medications:    albuterol  (PROVENTIL ) (2.5 MG/3ML) 0.083% nebulizer solution 2.5 mg, 2.5 mg, Nebulization, Q6H PRN, Danton Reyes DASEN, MD, 2.5 mg at 06/05/24 0840   carvedilol  (COREG  CR) 24 hr capsule 10 mg, 10 mg, Oral, Daily, Lenon Pons L, MD, 10 mg at 08/09/24 1219   feeding supplement (ENSURE ENLIVE / ENSURE PLUS) liquid 237 mL, 237 mL, Oral, BID BM PRN, Danton Reyes T, MD, 237 mL at 08/05/24 1650   furosemide  (LASIX ) tablet 40 mg, 40 mg, Oral, Daily, Robinson, Hao, [place], 40 mg at 08/08/24 9048   gabapentin  (NEURONTIN ) capsule 200 mg, 200 mg, Oral, BID, Lord, Jamison Y, NP, 200 mg at 08/09/24 1219   hydrALAZINE  (APRESOLINE ) tablet 25 mg, 25 mg, Oral, Q6H PRN, Sundil, Subrina, MD, 25 mg at 07/07/24 0044   labetalol  (NORMODYNE ) injection 20 mg, 20 mg, Intravenous, Q3H PRN, Mansy, Jan A, MD, 20 mg at 05/24/24 2250   lactulose  (CHRONULAC ) 10 GM/15ML solution 20 g, 20 g, Oral, BID PRN, Akula, Vijaya, MD   lisinopril  (ZESTRIL ) tablet 40 mg, 40 mg, Oral, Daily, Odell Castor, Erle, MD, 40 mg at 08/08/24 0951   lurasidone  (LATUDA ) tablet 40 mg, 40 mg, Oral, Q breakfast, Akintayo, Musa A, MD, 40  mg at 08/08/24 9048   meclizine  (ANTIVERT ) tablet 25 mg, 25 mg, Oral, TID PRN, Sreeram, Narendranath, MD, 25 mg at 08/08/24 2141   Oral care mouth rinse, 15 mL, Mouth Rinse, PRN, Regalado, Belkys A, MD   oxyCODONE  (Oxy IR/ROXICODONE ) immediate release tablet 10 mg, 10 mg, Oral, Q3H PRN, Leotis, Pardeep, MD, 10 mg at 08/09/24 1420   rivaroxaban  (XARELTO ) tablet 20 mg, 20 mg, Oral, QHS, Ogbata, Sylvester I, MD, 20 mg at 08/08/24  2141   rosuvastatin  (CRESTOR ) tablet 5 mg, 5 mg, Oral, QHS, Desai, Nikita S, MD, 5 mg at 08/06/24 2111   spironolactone  (ALDACTONE ) tablet 25 mg, 25 mg, Oral, Daily, Pahwani, Ravi, MD, 25 mg at 08/08/24 0951  Allergies: Allergies  Allergen Reactions   Acetaminophen  Hives, Nausea And Vomiting, Rash and Anaphylaxis    PT states that if he takes this he is going to the hospital   Motrin  [Ibuprofen ] Anaphylaxis   Nsaids Anaphylaxis    PT states that if he takes this he is going to the hospital   Lyrica  Cr [Pregabalin  Er] Other (See Comments)    Dystonia Dysarthria    Peanut-Containing Drug Products Other (See Comments)    Pt denies allergy to peanut butter 03/02/2021    Prentice Espy, MD PGY-4

## 2024-08-09 NOTE — Plan of Care (Signed)
 Talking about discharging to [facility] this week. Became upset with ACT team member here asking question. Taken to SW.   Problem: Nutrition: Goal: Adequate nutrition will be maintained Outcome: Progressing   Problem: Coping: Goal: Level of anxiety will decrease Outcome: Progressing   Problem: Pain Managment: Goal: General experience of comfort will improve and/or be controlled Outcome: Progressing   Problem: Safety: Goal: Ability to remain free from injury will improve Outcome: Progressing   Problem: Skin Integrity: Goal: Risk for impaired skin integrity will decrease Outcome: Progressing

## 2024-08-10 DIAGNOSIS — I639 Cerebral infarction, unspecified: Secondary | ICD-10-CM | POA: Diagnosis not present

## 2024-08-10 NOTE — Progress Notes (Signed)
 Pt adamantly refused labs this morning.  Md notified.

## 2024-08-10 NOTE — Progress Notes (Addendum)
 Physical Therapy Treatment Patient Details Name: [name] MRN: [id number] DOB: 02/22/1966 Today's Date: 08/10/2024   History of Present Illness Patient is 58 y.o. male presented to ED on 03/04/24 with stroke like symptoms when caregiver found him with slurred speech and right sided weakness. Head CT reveals Acute left MCA territory infarct affecting the insula, caudate nucleus, lentiform nucleus and internal capsule. As of August, pt has been working toward getting and fitting for a transfemoral prosthesis; PMH significant of  Schizoaffective disorder, heart failure with reduced ejection fraction, pretension, DMII, COPD, s/p bilateral amputations, chronic pain, and polysubstance abuse.    PT Comments  Continuing work on functional mobility and activity tolerance; session focused on, preparing for Toretto's right prosthesis, and we went to the mat table at the [employer] gym to practice standing with his left prosthetic foot on the floor, and the distal end of his right lower extremity on the Mat table; [name] stood in that way three times with mod assist; he has difficulty getting to fully upright standing; Needs to work on hip extension range; then this PT demonstrated a modified Thomas stretch off the side of the mountain, bolster debridging, and prone line with a towel under the side of the hip to be stretched;  More education was provided focusing on right prosthetic knee extension inflection; [name] verbalized understanding; he very much wants to use crutches with amb with his prostheses -- will need lots of training for this, and I'm unsure how much more training we will get with him; still, I'm willing to try with crutches as long as we have 2 person assist for safety;   Hopeful he can get his prosthesis on Monday, 9/15   If plan is discharge home, recommend the following: Supervision due to cognitive status;Direct supervision/assist for financial management;Direct supervision/assist for medications  management;Assist for transportation   Can travel by private vehicle      (soon)  Equipment Recommendations  Rolling walker (2 wheels) (will consider crutches)    Recommendations for Other Services       Precautions / Restrictions Precautions Precautions: Fall Precaution/Restrictions Comments: h/o BLE amputations Required Braces or Orthoses: Other Brace Other Brace: L prosthesis; and R prosthesis with Prosthetist present to check socket fit Restrictions Weight Bearing Restrictions Per Provider Order: No     Mobility  Bed Mobility               General bed mobility comments: OOB in wheelchair    Transfers Overall transfer level: Needs assistance Equipment used: Rolling walker (2 wheels) (and mat table as RLE sub in for prosthesis) Transfers: Bed to chair/wheelchair/BSC, Sit to/from Stand Sit to Stand: Mod assist          Lateral/Scoot Transfers: Supervision General transfer comment: Stood x3 with L prosthetic foot on the floor and R distal end of residual limb on the mat table (in [employer] Gym); Placed RW in front of pt; needing assist for forward translation of center of mass over LEs; tactile cues and facilitation for hip extension    Ambulation/Gait                   Stairs             Wheelchair Mobility     Tilt Bed    Modified Rankin (Stroke Patients Only) Modified Rankin (Stroke Patients Only) Pre-Morbid Rankin Score: Slight disability Modified Rankin: Moderate disability     Balance  Standing balance-Leahy Scale: Poor Standing balance comment: Heavy reliance on UE support; stood with L prosthesis and mat table supporting R limb                            Communication    Cognition Arousal: Alert Behavior During Therapy: WFL for tasks assessed/performed                             Following commands: Intact Following commands impaired: Follows multi-step commands with increased time     Cueing Cueing Techniques: Verbal cues, Visual cues (demonstrational cues)  Exercises Other Exercises Other Exercises: This PT demonstrated modified Thomas stretch for hip flexion stretching, bolstered bridging, and prone-lying with bolster under thigh; Pt declined performing therex    General Comments General comments (skin integrity, edema, etc.): [name] , CPO, unable to meet with us  today; Incr time and discussion of the rationale for hip extension strengthening and stretching; provided pt with a written HEP with Medbridge code for app (at this time, he does not have a sim card); Discussed the need to use legrests while using the wheelchair with his prostheses on; Not wanting to use legrests today      Pertinent Vitals/Pain Pain Assessment Pain Assessment: No/denies pain Pain Intervention(s): Monitored during session    Home Living                          Prior Function            PT Goals (current goals can now be found in the care plan section) Acute Rehab PT Goals Patient Stated Goal: Agreeable to working on prosthesis training PT Goal Formulation: With patient Time For Goal Achievement: 08/30/24 Potential to Achieve Goals: Fair Additional Goals Additional Goal #1: Pt will perform a floor to wheelchair transfer with nim assist with or wothout prostheses Progress towards PT goals: Goals updated    Frequency    Min 1X/week      PT Plan      Co-evaluation              AM-PAC PT 6 Clicks Mobility   Outcome Measure  Help needed turning from your back to your side while in a flat bed without using bedrails?: None Help needed moving from lying on your back to sitting on the side of a flat bed without using bedrails?: None Help needed moving to and from a bed to a chair (including a wheelchair)?: A Little Help needed standing up from a chair using your arms (e.g., wheelchair or bedside chair)?: A Lot Help needed to walk in hospital room?:  Total Help needed climbing 3-5 steps with a railing? : Total 6 Click Score: 15    End of Session   Activity Tolerance: Patient tolerated treatment well Patient left: Other (comment) (in wheelchair) Nurse Communication: Mobility status PT Visit Diagnosis: Other abnormalities of gait and mobility (R26.89);Muscle weakness (generalized) (M62.81);Other symptoms and signs involving the nervous system (R29.898);Hemiplegia and hemiparesis Hemiplegia - Right/Left: Right Hemiplegia - dominant/non-dominant: Dominant Hemiplegia - caused by: Cerebral infarction     Time: 1200-1300 PT Time Calculation (min) (ACUTE ONLY): 60 min  Charges:    $Therapeutic Activity: 23-37 mins $Self Care/Home Management: 8-22 PT General Charges $$ ACUTE PT VISIT: 1 Visit  [name], PT  Acute Rehabilitation Services Office [phone] Secure Chat welcomed    [name] 08/10/2024, 6:06 PM

## 2024-08-10 NOTE — Progress Notes (Signed)
 PROGRESS NOTE  [name]  DOB: Jul 25, 1966  PCP: Tanda Bleacher, MD FMW:[id number]  DOA: 03/04/2024  LOS: 159 days  Hospital Day: 160  Brief narrative: [name] is a 58 y.o. male with PMH significant for homelessness, DM2, HTN, CHF, bipolar disorder, schizoaffective disorder, cocaine abuse, COPD Patient is a to the ED 03/04/24 for slower speech, confusion and right-sided weakness CT head showed left MCA infarct CTA head and neck revealed an M2 occlusion on the left,   Stroke workup was completed Echocardiogram revealed large mobile left ventricular thrombus on the left and right ventricular apical thrombus in the right, ejection fraction less than 20%.   Cardiology was consulted recommended DOAC. PT evaluated patient and recommended skilled nursing facility placement.  Patient was found to lack medical decision capacity/status and not amenable to determining his discharge disposition or leave AGAINST MEDICAL ADVICE.  TOC is pursuing guardianship with APS as he is lacking medical decision capacity. He did complain of halluacinations, eating poor, refused medications, has insomnia.   Subjective: Patient was seen and examined this morning. On wheelchair.  Not in distress.  Alert, awake, Refused blood work today. Per psychiatry note, he was found to have competency by judge on 08/08/2024  Assessment and plan: Acute CVA (cerebrovascular accident)  He underwent extensive workup and imaging revealed left MCA territory infarct, involving caudate nucleus, lentiform nucleus and internal capsule confirmed on MRI. TEE was noted for large mobile left lower clot just under the mitral valve with an EF of 20% and a small right apical clot with reduced RV systolic function.  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 Blood pressure  in 90s this morning.  Okay to give Coreg .  Other on hold.  Continue to monitor Continue anticoagulation with Xarelto    Schizoaffective disorder / bipolar disorder H/o polysubstance abuse Lacks medical capacity: Initially psychiatry had deemed the patient to lack capacity. Per psychiatry note from 9/9, patient was found to competency by a judge on 08/08/2024. Refusing labs Psych meds per psychiatry.  Currently on Latuda , gabapentin    Hyperlipidemia Continue Crestor  5 mg PO at bedtime.   Diabetes mellitus type 2 last A1c of 5.4. refuses monitoring  Ensure enlive PO bid  Gabapentin  200 mg PO bid    Polysubstance abuse Ativan  1 mg PO q6 hr PRN  Latuda  40 mg PO daily  Oxycodone  10 mg PO q3 hr PRN    Deconditioning: Ambulatory status complicated by previous right AKA and left BKA,  has no insurance coverage for SNF. refusing therapy at times. Meclizine  25 mg PO tid PRN    H/o COPD: Albuterol  q6 hr PRN nebs   H/o DVT: Xarelto  20 mg PO daily    Mobility:  PT Orders: Active   PT Follow up Rec: Home Health Pt (Followed By Outpt Pt For Continuing Prosthesis Training; [name] Manage Safely In An Accessible Living Space With Assist/Supervision For Medications, Meals)08/02/2024 1400   Goals of care   Code Status: Full Code     DVT prophylaxis:   rivaroxaban  (XARELTO ) tablet 20 mg   Antimicrobials: None currently Fluid: None Consultants: Psychiatry Family Communication: None at bedside  Status: Inpatient Level of care:  Med-Surg   Patient is from: Homeless Anticipated d/c to: Difficulty disposition.   Diet:  Diet Order             Diet regular Fluid consistency: Thin  Diet effective now           Diet - low sodium heart healthy                   Scheduled Meds:  carvedilol   10 mg Oral Daily   furosemide   40 mg Oral Daily   gabapentin   200 mg Oral BID   lisinopril   40 mg Oral Daily   lurasidone   40 mg Oral Q breakfast   rivaroxaban   20 mg Oral QHS    rosuvastatin   5 mg Oral QHS   spironolactone   25 mg Oral Daily    PRN meds: albuterol , feeding supplement, hydrALAZINE , labetalol , lactulose , meclizine , mouth rinse, oxyCODONE    Infusions:    Antimicrobials: Anti-infectives (From admission, onward)    None       Objective: Vitals:   08/09/24 1048 08/10/24 1003  BP: 93/77 137/85  Pulse: 74 72  Resp: 18 18  SpO2: 100% 100%   No intake or output data in the 24 hours ending 08/10/24 1457    Filed Weights   03/26/24 0431 03/28/24 0456 04/04/24 0500  Weight: 42.7 kg 47.5 kg 61.7 kg   Weight change:  Body mass index is 20.09 kg/m.   Physical Exam: General exam: Pleasant, calm, comfortable on wheelchair Skin: No rashes, lesions or ulcers. HEENT: Atraumatic, normocephalic, no obvious bleeding Lungs: Clear to auscultation bilaterally,  CVS: S1, S2, no murmur,   GI/Abd: Soft, nontender, nondistended, bowel sound present,   CNS: Alert, awake, Psychiatry: Mood appropriate Extremities: Right AKA, left BKA status  Data Review: I have personally reviewed the laboratory data and studies available.  F/u labs  Unresulted Labs (From admission, onward)     Start     Ordered   08/10/24 0500  CBC with Differential/Platelet  Tomorrow morning,   R       Question:  Specimen collection method  Answer:  Lab=Lab collect   08/09/24 1148           Signed, Chapman Rota, MD [facility] 08/10/2024

## 2024-08-10 NOTE — TOC Progression Note (Signed)
 Transition of Care [facility]) - Progression Note    Patient Details  Name: [name] MRN: [id number] Date of Birth: December 29, 1965  [facility]) CM/SW Contact  [name], [name] Phone Number: 08/10/2024, 10:47 AM  Clinical Narrative:   CSW met with patient, he has now decided that he wants to move to [place]. Patient said he wants a new start and that's where he wants to go, said when his new debit card arrives that he wants to buy an [employer]. CSW asked where he was going, and patient said he would find a shelter or a rooming house once he got there, he just wants to get out of here. CSW again asked patient about a group home or assisted living in [place], and patient adamantly refused. CSW asked to speak with [name] to see if they can help, patient in agreement.  CSW spoke with Dwayne with [name], she also spoke with patient about going to [place]. Dwayne looked into options, and called CSW back that [facility] can continue to service patient in [place], but [name] does not service that area, so he would need a referral to another [employer]. CSW provided update to patient.  Patient also received a visit from [name] with [name], who is a trigger for the patient. Patient does not want to meet with [name], and was agitated about his visit. CSW spoke with unit management and nursing about [name]'s involvement with the patient, that the patient does not have to speak with [name] but that [name] has to remain involved in the patient's case as he is the only nurse available for patient's ACTT team. Patient calmed down and [name] left.   CSW also spoke with Inpatient Care Management Supervisor about patient's desire to leave and go to [place]. As the court has dismissed patient's guardianship case, patient is able to make his own decisions. CSW to encourage safe discharge plans, but patient has the right to  refuse and do whatever he wants. CSW to continue to follow.    Expected Discharge Plan: Home/Self Care Barriers to Discharge: Homeless with medical needs, Financial Resources               Expected Discharge Plan and Services       Living arrangements for the past 2 months: Apartment Expected Discharge Date: 03/14/24                                     Social Drivers of Health (SDOH) Interventions SDOH Screenings   Food Insecurity: Food Insecurity Present (03/06/2024)  Housing: High Risk (03/06/2024)  Transportation Needs: No Transportation Needs (03/06/2024)  Utilities: Patient Declined (03/06/2024)  Alcohol Screen: Medium Risk (06/24/2019)  Depression (PHQ2-9): Low Risk  (02/18/2024)  Recent Concern: Depression (PHQ2-9) - Medium Risk (01/21/2024)  Financial Resource Strain: High Risk (07/24/2021)   Received from [facility]  Physical Activity: Inactive (02/18/2024)  Social Connections: Unknown (04/05/2022)   Received from [facility]  Stress: Stress Concern Present (07/24/2021)   Received from [facility]  Tobacco Use: High Risk (03/04/2024)  Health Literacy: Adequate Health Literacy (02/18/2024)    Readmission Risk Interventions    12/21/2023    9:25 AM 12/09/2022    9:02 AM 12/01/2022    3:56 PM  Readmission Risk Prevention Plan  Transportation Screening Complete Complete Complete  HRI or Home Care Consult Complete  Social Work Consult for Recovery Care Planning/Counseling Complete    Palliative Care Screening Not Applicable    Medication Review Oceanographer) Complete Complete Complete  PCP or Specialist appointment within 3-5 days of discharge  Complete Complete  HRI or Home Care Consult  Complete Complete  SW Recovery Care/Counseling Consult  Complete Complete  Palliative Care Screening  Not Applicable Not Applicable  Skilled Nursing Facility  Not Applicable Not Applicable  SNF Comments   n/a

## 2024-08-11 DIAGNOSIS — I639 Cerebral infarction, unspecified: Secondary | ICD-10-CM | POA: Diagnosis not present

## 2024-08-11 NOTE — Progress Notes (Signed)
 Speech Language Pathology Treatment: Cognitive-Linguistic  Patient Details Name: [name] MRN: [id number] DOB: 1966-03-10 Today's Date: 08/11/2024 Time: 1500-1520 SLP Time Calculation (min) (ACUTE ONLY): 20 min  Assessment / Plan / Recommendation Clinical Impression  Pt is eager to leave the hospital, but also remembers that he is getting his prosthesis next week, so he wants to make sure he can get that. He was not interested in working on functional cognition today, stating that he does not need to despite needing cues in previous sessions. He was still agreeable overall to working in therapy though, so focused today more on expressive language. Pt engaged in divergent naming tasks, naming 7 words in one minute and then 6 words in another category. He added an additional 7 words when given extra time and Min verbal cues. Pt's maximum was 16 words in one minute, when selecting his own category. He then completed a word association task with Min cues and 7/9 accuracy. Pt would still benefit from intermittent assistance and f/u SLP upon discharge.    HPI HPI: Patient is 58 y.o. male presented to ED on 03/04/24 with stroke like symptoms when caregiver found him with slurred speech and right sided weakness. Head CT reveals Acute left MCA territory infarct affecting the insula, caudate nucleus, lentiform nucleus and internal capsule. Pt initially evaluated by SLP 4/5 but s/o 4/22 due to repeated refusals. SLP reordered 5/13 for repeat cognitive-linguistic eval.   PMH significant of  Schizoaffective disorder, heart failure with reduced ejection fraction, pretension, DMII, COPD, s/p bilateral amputations, chronic pain, and polysubstance abuse.      SLP Plan  Continue with current plan of care          Recommendations                         Intermittent Supervision/Assistance (assistance wtih meds/meals/finances) Cognitive communication deficit (M58.158)     Continue with current  plan of care     Leita SAILOR., M.A. CCC-SLP Acute Rehabilitation Services Office: [phone]  Secure chat preferred   08/11/2024, 3:23 PM

## 2024-08-11 NOTE — Plan of Care (Signed)
   Problem: Nutrition: Goal: Adequate nutrition will be maintained Outcome: Progressing   Problem: Coping: Goal: Level of anxiety will decrease Outcome: Progressing   Problem: Elimination: Goal: Will not experience complications related to bowel motility Outcome: Progressing

## 2024-08-11 NOTE — Progress Notes (Signed)
 PROGRESS NOTE  [name]  DOB: Jul 19, 1966  PCP: Tanda Bleacher, MD FMW:[id number]  DOA: 03/04/2024  LOS: 160 days  Hospital Day: 161  Brief narrative: [name] is a 58 y.o. male with PMH significant for homelessness, DM2, HTN, CHF, bipolar disorder, schizoaffective disorder, cocaine abuse, COPD Patient is a to the ED 03/04/24 for slower speech, confusion and right-sided weakness CT head showed left MCA infarct CTA head and neck revealed an M2 occlusion on the left,   Stroke workup was completed Echocardiogram revealed large mobile left ventricular thrombus on the left and right ventricular apical thrombus in the right, ejection fraction less than 20%.   Cardiology was consulted recommended DOAC. PT evaluated patient and recommended skilled nursing facility placement.  Patient was found to lack medical decision capacity/status and not amenable to determining his discharge disposition or leave AGAINST MEDICAL ADVICE.  TOC is pursuing guardianship with APS as he is lacking medical decision capacity. He did complain of halluacinations, eating poor, refused medications, has insomnia.   Subjective: Patient was seen and examined this morning. Sitting on on wheelchair.  Not in distress.  Alert, awake, Refused blood work today. Per psychiatry note, he was found to have competency by judge on 08/08/2024  Assessment and plan: Acute CVA (cerebrovascular accident)  He underwent extensive workup and imaging revealed left MCA territory infarct, involving caudate nucleus, lentiform nucleus and internal capsule confirmed on MRI. TEE was noted for large mobile left lower clot just under the mitral valve with an EF of 20% and a small right apical clot with reduced RV systolic function.  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 Blood pressure in low normal range. Continue anticoagulation with Xarelto    Schizoaffective disorder / bipolar disorder H/o polysubstance abuse Lacks medical capacity: Initially psychiatry had deemed the patient to lack capacity. Per psychiatry note from 9/9, patient was found to competency by a judge on 08/08/2024. Refusing labs Psych meds per psychiatry.  Currently on Latuda , gabapentin    Hyperlipidemia Continue Crestor  5 mg PO at bedtime.   Diabetes mellitus type 2 last A1c of 5.4. refuses monitoring  Ensure enlive PO bid  Gabapentin  200 mg PO bid    Polysubstance abuse Ativan  1 mg PO q6 hr PRN  Latuda  40 mg PO daily  Oxycodone  10 mg PO q3 hr PRN    Deconditioning: Ambulatory status complicated by previous right AKA and left BKA,  has no insurance coverage for SNF. refusing therapy at times. Meclizine  25 mg PO tid PRN    H/o COPD: Albuterol  q6 hr PRN nebs   H/o DVT: Xarelto  20 mg PO daily    Mobility:  PT Orders: Active   PT Follow up Rec: Home Health Pt (Followed By Outpt Pt For Continuing Prosthesis Training; [name] Manage Safely In An Accessible Living Space With Assist/Supervision For Medications, Meals)08/10/2024 1700   Goals of care   Code Status: Full Code     DVT prophylaxis:   rivaroxaban  (XARELTO ) tablet 20 mg   Antimicrobials: None currently Fluid: None Consultants: Psychiatry Family Communication: None at bedside  Status: Inpatient Level of care:  Med-Surg   Patient is from: Homeless Anticipated d/c to: Difficulty disposition.   Diet:  Diet Order             Diet regular Fluid consistency: Thin  Diet effective now  Diet - low sodium heart healthy                   Scheduled Meds:  carvedilol   10 mg Oral Daily   furosemide   40 mg Oral Daily   gabapentin   200 mg Oral BID   lisinopril   40 mg Oral Daily   lurasidone   40 mg Oral Q breakfast   rivaroxaban   20 mg Oral QHS   rosuvastatin   5 mg Oral QHS    spironolactone   25 mg Oral Daily    PRN meds: albuterol , feeding supplement, hydrALAZINE , labetalol , lactulose , meclizine , mouth rinse, oxyCODONE    Infusions:    Antimicrobials: Anti-infectives (From admission, onward)    None       Objective: Vitals:   08/11/24 1000 08/11/24 1234  BP: 107/73 108/69  Pulse: 87 77  Resp: 18 18  Temp:  (!) 97.5 F (36.4 C)  SpO2: 100% 97%    Intake/Output Summary (Last 24 hours) at 08/11/2024 1339 Last data filed at 08/11/2024 0600 Gross per 24 hour  Intake 480 ml  Output --  Net 480 ml      Filed Weights   03/26/24 0431 03/28/24 0456 04/04/24 0500  Weight: 42.7 kg 47.5 kg 61.7 kg   Weight change:  Body mass index is 20.09 kg/m.   Physical Exam: General exam: Pleasant, calm, comfortable on wheelchair Skin: No rashes, lesions or ulcers. HEENT: Atraumatic, normocephalic, no obvious bleeding Lungs: Clear to auscultation bilaterally,  CVS: S1, S2, no murmur,   GI/Abd: Soft, nontender, nondistended, bowel sound present,   CNS: Alert, awake, Psychiatry: Mood appropriate Extremities: Right AKA, left BKA status  Data Review: I have personally reviewed the laboratory data and studies available.  F/u labs  Unresulted Labs (From admission, onward)     Start     Ordered   08/10/24 0500  CBC with Differential/Platelet  Tomorrow morning,   R       Question:  Specimen collection method  Answer:  Lab=Lab collect   08/09/24 1148           Signed, Chapman Rota, MD [facility] 08/11/2024

## 2024-08-11 NOTE — Plan of Care (Signed)
  Problem: Pain Managment: Goal: General experience of comfort will improve and/or be controlled Outcome: Progressing   Problem: Safety: Goal: Ability to remain free from injury will improve Outcome: Progressing   Problem: Skin Integrity: Goal: Risk for impaired skin integrity will decrease Outcome: Progressing

## 2024-08-11 NOTE — Progress Notes (Signed)
 Chaplain visited with pt [name] in the hallway. He shared his thoughts and feelings regarding his upcoming discharge, and requests that I follow up with him tomorrow.  Chaplains remain available in the meantime.

## 2024-08-12 DIAGNOSIS — I639 Cerebral infarction, unspecified: Secondary | ICD-10-CM | POA: Diagnosis not present

## 2024-08-12 NOTE — Progress Notes (Signed)
 Occupational Therapy Treatment Patient Details Name: [name] MRN: [id number] DOB: [birth year] Today's Date: 08/12/2024   History of present illness Patient is 58 y.o. male presented to ED on 03/04/24 with stroke like symptoms when caregiver found him with slurred speech and right sided weakness. Head CT reveals Acute left MCA territory infarct affecting the insula, caudate nucleus, lentiform nucleus and internal capsule. As of August, pt has been working toward getting and fitting for a transfemoral prosthesis; PMH significant of  Schizoaffective disorder, heart failure with reduced ejection fraction, pretension, DMII, COPD, s/p bilateral amputations, chronic pain, and polysubstance abuse.   OT comments  Patient continues to perform well with OT treatment with supervision for tub bench transfers and distant supervision for bathing seated on tub bench. Patient continues to require assistance for LB dressing at wheelchair level.  Patient has been performing toilet transfers independently in room. Discharge recommendations continue to be appropriate.  Acute OT to continue to follow to address established goals to facilitate DC to next venue of care.        If plan is discharge home, recommend the following:  A little help with walking and/or transfers;A little help with bathing/dressing/bathroom;Supervision due to cognitive status;Direct supervision/assist for financial management;Direct supervision/assist for medications management   Equipment Recommendations  None recommended by OT    Recommendations for Other Services      Precautions / Restrictions Precautions Precautions: Fall Recall of Precautions/Restrictions: Impaired Precaution/Restrictions Comments: h/o BLE amputations Required Braces or Orthoses: Other Brace Other Brace: L prosthesis; and R prosthesis with Prosthetist present to check socket fit Restrictions Weight Bearing Restrictions Per Provider Order: No       Mobility  Bed Mobility Overal bed mobility: Modified Independent             General bed mobility comments: OOB in wheelchair    Transfers Overall transfer level: Needs assistance Equipment used: None Transfers: Bed to chair/wheelchair/BSC            Lateral/Scoot Transfers: Supervision General transfer comment: lateral scoot transfer to tub bench and back to wheelchair.  Patient requiring assistance with setup due to size of room     Balance Overall balance assessment: Mild deficits observed, not formally tested                                         ADL either performed or assessed with clinical judgement   ADL Overall ADL's : Needs assistance/impaired     Grooming: Modified independent;Sitting Grooming Details (indicate cue type and reason): chair level at sink Upper Body Bathing: Supervision/ safety;Sitting Upper Body Bathing Details (indicate cue type and reason): distant supervision for safety Lower Body Bathing: Supervison/ safety;Sitting/lateral leans Lower Body Bathing Details (indicate cue type and reason): distant supervision for safety Upper Body Dressing : Set up;Sitting Upper Body Dressing Details (indicate cue type and reason): pullover top Lower Body Dressing: Minimal assistance;Sitting/lateral leans Lower Body Dressing Details (indicate cue type and reason): patient uses upper extremities to clear bottom and requires assistance to pull up Toilet Transfer: Modified Independent;Regular Toilet       Tub/ Shower Transfer: Supervision/safety;Tub transfer;Tub bench Tub/Shower Transfer Details (indicate cue type and reason): supervision for safety Functional mobility during ADLs: Wheelchair;Modified independent      Extremity/Trunk Assessment              Vision       Perception  Praxis     Communication Communication Communication: Impaired Factors Affecting Communication: Difficulty expressing self;Reduced clarity of speech    Cognition Arousal: Alert Behavior During Therapy: WFL for tasks assessed/performed Cognition: Cognition impaired           Executive functioning impairment (select all impairments): Problem solving, Reasoning                   Following commands: Intact Following commands impaired: Follows multi-step commands with increased time      Cueing   Cueing Techniques: Verbal cues  Exercises      Shoulder Instructions       General Comments      Pertinent Vitals/ Pain       Pain Assessment Pain Assessment: No/denies pain Pain Intervention(s): Monitored during session  Home Living                                          Prior Functioning/Environment              Frequency  Min 1X/week        Progress Toward Goals  OT Goals(current goals can now be found in the care plan section)  Progress towards OT goals: Progressing toward goals  Acute Rehab OT Goals Patient Stated Goal: none stated OT Goal Formulation: With patient Time For Goal Achievement: 08/16/24 Potential to Achieve Goals: Fair ADL Goals Pt Will Transfer to Toilet: with modified independence;bedside commode (drop arm BSC, no cues for safety) Pt Will Perform Toileting - Clothing Manipulation and hygiene: with mod assist;sitting/lateral leans Pt Will Perform Tub/Shower Transfer: Tub transfer;tub bench;with modified independence Pt/caregiver will Perform Home Exercise Program: Right Upper extremity;Independently;With written HEP provided Additional ADL Goal #1: pt will don prosthesis mod I Additional ADL Goal #2: pt will perform squat pivot with prosthesis donned with mod I Additional ADL Goal #3: Pt will demonstrate proper safety with transfers/mobility 100% of the time during session. Additional ADL Goal #4: Pt will demonstrate appropriate safety awareness 100% of the time when positioning w/c to transfer  Plan      Co-evaluation                 AM-PAC OT 6  Clicks Daily Activity     Outcome Measure   Help from another person eating meals?: None Help from another person taking care of personal grooming?: A Little Help from another person toileting, which includes using toliet, bedpan, or urinal?: A Little Help from another person bathing (including washing, rinsing, drying)?: A Little Help from another person to put on and taking off regular upper body clothing?: A Little Help from another person to put on and taking off regular lower body clothing?: A Little 6 Click Score: 19    End of Session Equipment Utilized During Treatment: Other (comment) (wheelchair and tub bench)  OT Visit Diagnosis: Unsteadiness on feet (R26.81);Other abnormalities of gait and mobility (R26.89);Muscle weakness (generalized) (M62.81);Other symptoms and signs involving the nervous system (R29.898);Other symptoms and signs involving cognitive function;Hemiplegia and hemiparesis Hemiplegia - Right/Left: Right Hemiplegia - dominant/non-dominant: Dominant Hemiplegia - caused by: Cerebral infarction   Activity Tolerance Patient tolerated treatment well   Patient Left in chair;with call bell/phone within reach   Nurse Communication Mobility status        Time: 8973-8947 OT Time Calculation (min): 26 min  Charges: OT General Charges $OT Visit: 1 Visit OT  Treatments $Self Care/Home Management : 23-37 mins  [name], OTA Acute Rehabilitation Services  Office [phone]   [name] 08/12/2024, 1:45 PM

## 2024-08-12 NOTE — TOC Progression Note (Signed)
 Transition of Care [facility]) - Progression Note    Patient Details  Name: [name] MRN: [id number] Date of Birth: 1966-07-14  Transition of Care [name]) CM/SW Contact  [name], [name] Phone Number: 08/12/2024, 10:56 AM  Clinical Narrative:   CSW met with patient per request. Patient expressed frustration with CSW that [name] met with CSW yesterday, that he does not want [name] involved and he feels like his rights are not being respected. CSW apologized and attempted to educate the patient that [name] has to be involved if he wants to have the ACTT Team help him with housing. Patient ultimately said that it's OK if CSW speaks with him, but he does not want to see [name] at all.  Patient also asking for update on options in [name]. CSW has not received an update from [name] yet, but did receive contact from [name] with [name] for a new PCS services form if patient will need PCS established in [place]. [name] to assist. CSW provided update to patient. Patient expressed feeling inpatient to leave the hospital, but is agreeable today to give some time to get services arranged. CSW to follow.    Expected Discharge Plan: Home/Self Care Barriers to Discharge: Homeless with medical needs, Financial Resources               Expected Discharge Plan and Services       Living arrangements for the past 2 months: Apartment Expected Discharge Date: 03/14/24                                     Social Drivers of Health (SDOH) Interventions SDOH Screenings   Food Insecurity: Food Insecurity Present (03/06/2024)  Housing: High Risk (03/06/2024)  Transportation Needs: No Transportation Needs (03/06/2024)  Utilities: Patient Declined (03/06/2024)  Alcohol Screen: Medium Risk (06/24/2019)  Depression (PHQ2-9): Low Risk  (02/18/2024)  Recent Concern: Depression (PHQ2-9) - Medium Risk (01/21/2024)  Financial Resource Strain: High Risk (07/24/2021)    Received from [facility]  Physical Activity: Inactive (02/18/2024)  Social Connections: Unknown (04/05/2022)   [facility]  Stress: Stress Concern Present (07/24/2021)   Received from [facility]  Tobacco Use: High Risk (03/04/2024)  Health Literacy: Adequate Health Literacy (02/18/2024)    Readmission Risk Interventions    12/21/2023    9:25 AM 12/09/2022    9:02 AM 12/01/2022    3:56 PM  Readmission Risk Prevention Plan  Transportation Screening Complete Complete Complete  HRI or Home Care Consult Complete    Social Work Consult for Recovery Care Planning/Counseling Complete    Palliative Care Screening Not Applicable    Medication Review Oceanographer) Complete Complete Complete  PCP or Specialist appointment within 3-5 days of discharge  Complete Complete  HRI or Home Care Consult  Complete Complete  SW Recovery Care/Counseling Consult  Complete Complete  Palliative Care Screening  Not Applicable Not Applicable  Skilled Nursing Facility  Not Applicable Not Applicable  SNF Comments   n/a

## 2024-08-12 NOTE — TOC Progression Note (Signed)
 Transition of Care [facility]) - Progression Note    Patient Details  Name: [name] MRN: [id number] Date of Birth: 11-03-1966  Transition [facility] Care [facility]) CM/SW Contact  [name], [name] Phone Number: 08/12/2024, 10:58 AM  Clinical Narrative:   CSW met with patient this morning per request, he still has not received his debit card and is frustrated. Patient also frustrated that there's no progress yet on housing, he says he will just order his train ticket and leave whenever his debit card arrives. CSW attempted to encourage patient to wait, and he said he wouldn't.   CSW received a call later from Joppa with [name] that she has been in contact with some places in [place] that may have openings, she will be in tomorrow afternoon to discuss. Joppa was able to speak with patient, he is agreeable to meet with Joppa tomorrow to discuss.  CSW received call from [employer], attorney assigned to patient during court hearing, to confirm that the patient was found to be competent, he will be sending CSW the court document for the patient's records. CSW to follow.    Expected Discharge Plan: Home/Self Care Barriers to Discharge: Homeless with medical needs, Financial Resources               Expected Discharge Plan and Services       Living arrangements for the past 2 months: Apartment Expected Discharge Date: 03/14/24                                     Social Drivers of Health (SDOH) Interventions SDOH Screenings   Food Insecurity: Food Insecurity Present (03/06/2024)  Housing: High Risk (03/06/2024)  Transportation Needs: No Transportation Needs (03/06/2024)  Utilities: Patient Declined (03/06/2024)  Alcohol Screen: Medium Risk (06/24/2019)  Depression (PHQ2-9): Low Risk  (02/18/2024)  Recent Concern: Depression (PHQ2-9) - Medium Risk (01/21/2024)  Financial Resource Strain: High Risk (07/24/2021)   Received from [facility]  Physical Activity: Inactive  (02/18/2024)  Social Connections: Unknown (04/05/2022)   Received from [facility]  Stress: Stress Concern Present (07/24/2021)   Received from [facility]  Tobacco Use: High Risk (03/04/2024)  Health Literacy: Adequate Health Literacy (02/18/2024)    Readmission Risk Interventions    12/21/2023    9:25 AM 12/09/2022    9:02 AM 12/01/2022    3:56 PM  Readmission Risk Prevention Plan  Transportation Screening Complete Complete Complete  HRI or Home Care Consult Complete    Social Work Consult for Recovery Care Planning/Counseling Complete    Palliative Care Screening Not Applicable    Medication Review Oceanographer) Complete Complete Complete  PCP or Specialist appointment within 3-5 days of discharge  Complete Complete  HRI or Home Care Consult  Complete Complete  SW Recovery Care/Counseling Consult  Complete Complete  Palliative Care Screening  Not Applicable Not Applicable  Skilled Nursing Facility  Not Applicable Not Applicable  SNF Comments   n/a

## 2024-08-12 NOTE — Progress Notes (Signed)
 PROGRESS NOTE  [name] FMW:[id number] DOB: 08-19-1966 DOA: 03/04/2024 PCP: Tanda Bleacher, MD   LOS: 161 days   Brief narrative: [name] is a 58 y.o. male with PMH significant for homelessness, diabetes mellitus type 2, hypertension, CHF, bipolar disorder, schizoaffective disorder, cocaine abuse, COPD presented to the hospital on 03/04/2024 for slurred speech confusion and right-sided weakness.  CT head scan showed a left MCA infarct and M2 occlusion on CTA of the head.  2D echocardiogram showed large left ventricular thrombus with EF of less than 20%.  Cardiology was consulted and patient was started on anticoagulation.  PT evaluated the patient and recommended skilled nursing facility but patient lacked medical decision capacity/status and not amenable to determining his discharge disposition or leave AGAINST MEDICAL ADVICE.  TOC is pursuing guardianship with APS as he is lacking medical decision capacity. He did complain of halluacinations, eating poor, refused medications, has insomnia.    Assessment/Plan: Principal Problem:   Acute CVA (cerebrovascular accident) (HCC) Active Problems:   LV (left ventricular) mural thrombus   Chronic systolic CHF (congestive heart failure) (HCC)   Prolonged QT interval   Elevated liver enzymes   COPD (chronic obstructive pulmonary disease) (HCC)   Cocaine abuse (HCC)   Schizoaffective disorder (HCC)   Benign essential HTN   Acid reflux   History of DVT (deep vein thrombosis)   Type 2 diabetes mellitus (HCC)   Status post bilateral BKA.   Dilated cardiomyopathy (HCC)  Acute CVA (cerebrovascular accident)  Missing refills left MCA territory infarct, involving caudate nucleus, lentiform nucleus and internal capsule confirmed on MRI. TEE was noted for large mobile left lower clot just under the mitral valve with an EF of 20% and a small right apical clot with reduced RV systolic function.   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  Continue anticoagulation with Xarelto .   Schizoaffective disorder / bipolar disorder H/o polysubstance abuse Lack of medical decision making capacity. Seen by psychiatry and patient lacks medical decision-making capacity. Per psychiatry note from 08/09/24, patient was found to competency by a judge on 08/08/2024. Continue Latuda  gabapentin .   Hyperlipidemia Continue Crestor     Diabetes mellitus type 2 Review of last last A1c of 5.4. refuses monitoring  Ensure enlive PO bid  Continue gabapentin .   Polysubstance abuse Continue Ativan  Latuda  and oxycodone .    Deconditioning: Ambulatory status complicated by previous right AKA and left BKA,  has no insurance coverage for SNF.  Refuses therapy at times.   H/o COPD: Continue albuterol  q6 hr PRN nebs   H/o DVT: Xarelto  20 mg PO daily  DVT prophylaxis:  rivaroxaban  (XARELTO ) tablet 20 mg   Disposition: Difficult disposition  Status is: Inpatient Remains inpatient appropriate because: Difficult disposition    Code Status:     Code Status: Full Code  Family Communication: None  Consultants: Psychiatry  Procedures: None  Anti-infectives:  None  Anti-infectives (From admission, onward)    None        Subjective: Today, patient was seen and examined at bedside.  Patient denies any interval complaints.  Wishes to go home at some point.  On we will see if  Objective: Vitals:   08/11/24 1000 08/11/24 1234  BP: 107/73 108/69  Pulse: 87 77  Resp: 18 18  Temp:  (!) 97.5 F (36.4 C)  SpO2: 100% 97%    Intake/Output Summary (Last 24  hours) at 08/12/2024 0845 Last data filed at 08/12/2024 0600 Gross per 24 hour  Intake 960 ml  Output --  Net 960 ml   Filed Weights   03/26/24 0431 03/28/24 0456 04/04/24 0500  Weight: 42.7 kg 47.5 kg 61.7 kg   Body mass  index is 20.09 kg/m.   Physical Exam: GENERAL: Patient is alert awake, Communicative, on a wheelchair. HENT: No scleral pallor or icterus. Pupils equally reactive to light. Oral mucosa is moist NECK: is supple, no gross swelling noted. CHEST: Clear to auscultation. No crackles or wheezes.   CVS: S1 and S2 heard, no murmur. Regular rate and rhythm.  ABDOMEN: Soft, non-tender, bowel sounds are present. EXTREMITIES right AKA, left below-knee amputation CNS: Cranial nerves are intact. SKIN: warm and dry without rashes.  Data Review: I have personally reviewed the following laboratory data and studies,  CBC: No results for input(s): WBC, NEUTROABS, HGB, HCT, MCV, PLT in the last 168 hours. Basic Metabolic Panel: No results for input(s): NA, K, CL, CO2, GLUCOSE, BUN, CREATININE, CALCIUM , MG, PHOS in the last 168 hours. Liver Function Tests: No results for input(s): AST, ALT, ALKPHOS, BILITOT, PROT, ALBUMIN in the last 168 hours. No results for input(s): LIPASE, AMYLASE in the last 168 hours. No results for input(s): AMMONIA in the last 168 hours. Cardiac Enzymes: No results for input(s): CKTOTAL, CKMB, CKMBINDEX, TROPONINI in the last 168 hours. BNP (last 3 results) Recent Labs    01/13/24 1936 01/16/24 1954 03/10/24 0601  BNP 2,455.2* 716.5* 1,475.2*    ProBNP (last 3 results) No results for input(s): PROBNP in the last 8760 hours.  CBG: No results for input(s): GLUCAP in the last 168 hours. No results found for this or any previous visit (from the past 240 hours).   Studies: No results found.    Atley Neubert, MD  [facility] 08/12/2024  If 7PM-7AM, please contact night-coverage

## 2024-08-12 NOTE — TOC Progression Note (Signed)
 Transition of Care [facility]) - Progression Note    Patient Details  Name: [name] MRN: [id number] Date of Birth: January 09, 1966  [facility]) CM/SW Contact  [name], [name] Phone Number: 08/12/2024, 4:34 PM  Clinical Narrative:   CSW met with patient and Joppa with [name] to discuss housing options. Joppa located an accessible boarding house in [place], and patient completed intake assessment with the owner. Patient in agreement to move forward. Joppa provided contact information for the boarding house owner, but the number was incorrect. CSW contacted Joppa to request phone number to speak with boarding house owner, awaiting response. Room won't be available for the patient until sometime next week.  Patient will also need a new care team in [place]. [name] to provide CSW with information on ACT Team options, and patient would like CSW to assist him with choosing a new team. Patient will need new PCP. CSW contacted [name] to ask about providers, a directory was sent. CSW to discuss PCP options with the patient. Per [name], he may need a new PCS form completed for caregiver support in his new rooming house, form sent to CSW to complete. CSW to follow.    Expected Discharge Plan: Home/Self Care Barriers to Discharge: Homeless with medical needs, Financial Resources               Expected Discharge Plan and Services       Living arrangements for the past 2 months: Apartment Expected Discharge Date: 03/14/24                                     Social Drivers of Health (SDOH) Interventions SDOH Screenings   Food Insecurity: Food Insecurity Present (03/06/2024)  Housing: High Risk (03/06/2024)  Transportation Needs: No Transportation Needs (03/06/2024)  Utilities: Patient Declined (03/06/2024)  Alcohol Screen: Medium Risk (06/24/2019)  Depression (PHQ2-9): Low Risk  (02/18/2024)  Recent Concern: Depression (PHQ2-9) -  Medium Risk (01/21/2024)  Financial Resource Strain: High Risk (07/24/2021)   Received from [facility]  Physical Activity: Inactive (02/18/2024)  Social Connections: Unknown (04/05/2022)   Received from [facility]  Stress: Stress Concern Present (07/24/2021)   Received from [facility]  Tobacco Use: High Risk (03/04/2024)  Health Literacy: Adequate Health Literacy (02/18/2024)    Readmission Risk Interventions    12/21/2023    9:25 AM 12/09/2022    9:02 AM 12/01/2022    3:56 PM  Readmission Risk Prevention Plan  Transportation Screening Complete Complete Complete  HRI or Home Care Consult Complete    Social Work Consult for Recovery Care Planning/Counseling Complete    Palliative Care Screening Not Applicable    Medication Review Oceanographer) Complete Complete Complete  PCP or Specialist appointment within 3-5 days of discharge  Complete Complete  HRI or Home Care Consult  Complete Complete  SW Recovery Care/Counseling Consult  Complete Complete  Palliative Care Screening  Not Applicable Not Applicable  Skilled Nursing Facility  Not Applicable Not Applicable  SNF Comments   n/a

## 2024-08-12 NOTE — Plan of Care (Signed)
  Problem: Nutrition: Goal: Adequate nutrition will be maintained Outcome: Progressing   Problem: Coping: Goal: Level of anxiety will decrease Outcome: Progressing   Problem: Elimination: Goal: Will not experience complications related to urinary retention Outcome: Progressing   Problem: Pain Managment: Goal: General experience of comfort will improve and/or be controlled Outcome: Progressing   Problem: Safety: Goal: Ability to remain free from injury will improve Outcome: Progressing

## 2024-08-12 NOTE — Progress Notes (Signed)
 Chaplain followed up with pt [name]. Provided prayer and a blessing on his journey forward. Please continue to reach out if needed.

## 2024-08-13 DIAGNOSIS — I639 Cerebral infarction, unspecified: Secondary | ICD-10-CM | POA: Diagnosis not present

## 2024-08-13 NOTE — Progress Notes (Signed)
 PROGRESS NOTE  [name] FMW:[id number] DOB: 1966-03-24 DOA: 03/04/2024 PCP: Tanda Bleacher, MD   LOS: 162 days   Brief narrative: [name] is a 58 y.o. male with PMH significant for homelessness, diabetes mellitus type 2, hypertension, CHF, bipolar disorder, schizoaffective disorder, cocaine abuse, COPD presented to the hospital on 03/04/2024 for slurred speech confusion and right-sided weakness.  CT head scan showed a left MCA infarct and M2 occlusion on CTA of the head.  2D echocardiogram showed large left ventricular thrombus with EF of less than 20%.  Cardiology was consulted and patient was started on anticoagulation.  PT evaluated the patient and recommended skilled nursing facility but patient lacked medical decision capacity/status and not amenable to determining his discharge disposition or leave AGAINST MEDICAL ADVICE.  TOC was pursuing guardianship with APS as he was lacking medical decision capacity. He did complain of halluacinations, eating poor, refused medications, has insomnia.    Assessment/Plan: Principal Problem:   Acute CVA (cerebrovascular accident) (HCC) Active Problems:   LV (left ventricular) mural thrombus   Chronic systolic CHF (congestive heart failure) (HCC)   Prolonged QT interval   Elevated liver enzymes   COPD (chronic obstructive pulmonary disease) (HCC)   Cocaine abuse (HCC)   Schizoaffective disorder (HCC)   Benign essential HTN   Acid reflux   History of DVT (deep vein thrombosis)   Type 2 diabetes mellitus (HCC)   Status post bilateral BKA.   Dilated cardiomyopathy (HCC)  Acute CVA (cerebrovascular accident)   left MCA territory infarct, involving caudate nucleus, lentiform nucleus and internal capsule confirmed on MRI. TEE was noted for large mobile left lower clot just under the mitral valve with an EF of 20% and a small right apical clot with reduced RV systolic function.  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 Continue Coreg , Lasix , lisinopril , Aldactone , hydralazine , Xarelto .  Currently stable   Schizoaffective disorder / bipolar disorder H/o polysubstance abuse Lack of medical decision making capacity. Seen by psychiatry and had a prolonged hospitalization due to lack of medical decision-making capacity.Continue Latuda  gabapentin .  Recently seen by judge and has been considered competent to make his own decisions on 08/08/2024   Hyperlipidemia Continue Crestor     Diabetes mellitus type 2 Review of last last A1c of 5.4. refuses monitoring  Ensure enlive PO bid . Continue gabapentin .   Polysubstance abuse Continue Ativan  Latuda  and oxycodone .    Deconditioning: Ambulatory status complicated by previous right AKA and left BKA,  has no insurance coverage for SNF.  Refuses therapy at times.   H/o COPD: Continue albuterol  q6 hr PRN nebs   H/o DVT: Xarelto  20 mg PO daily  DVT prophylaxis:  rivaroxaban  (XARELTO ) tablet 20 mg   Disposition: Difficult disposition.  Medically stable for disposition  Status is: Inpatient Remains inpatient appropriate because: Difficult disposition    Code Status:     Code Status: Full Code  Family Communication: None  Consultants: Psychiatry  Procedures: None  Anti-infectives:  None  Anti-infectives (From admission, onward)    None        Subjective: Today, patient was seen and examined at bedside.  Denies any nausea vomiting fever chills or rigor.  Objective: Vitals:   08/12/24 1644 08/12/24 2009  BP: (!) 122/111 (!) 101/59  Pulse: 70 85  Resp: 18   Temp:    SpO2: 100%     Intake/Output Summary (Last 24 hours)  at 08/13/2024 0805 Last data filed at 08/13/2024 0500 Gross per 24 hour  Intake 720 ml  Output --  Net 720 ml   Filed Weights   03/26/24 0431 03/28/24 0456 04/04/24 0500  Weight: 42.7 kg 47.5 kg 61.7 kg   Body mass index is  20.09 kg/m.   Physical Exam:  GENERAL: Patient is alert awake, Communicative, lying in bed. HENT: No scleral pallor or icterus. Pupils equally reactive to light. Oral mucosa is moist NECK: is supple, no gross swelling noted. CHEST: Clear to auscultation. No crackles or wheezes.   CVS: S1 and S2 heard, no murmur. Regular rate and rhythm.  ABDOMEN: Soft, non-tender, bowel sounds are present. EXTREMITIES right AKA, left below-knee amputation CNS: Cranial nerves are intact. SKIN: warm and dry without rashes.  Data Review: I have personally reviewed the following laboratory data and studies,  CBC: No results for input(s): WBC, NEUTROABS, HGB, HCT, MCV, PLT in the last 168 hours. Basic Metabolic Panel: No results for input(s): NA, K, CL, CO2, GLUCOSE, BUN, CREATININE, CALCIUM , MG, PHOS in the last 168 hours. Liver Function Tests: No results for input(s): AST, ALT, ALKPHOS, BILITOT, PROT, ALBUMIN in the last 168 hours. No results for input(s): LIPASE, AMYLASE in the last 168 hours. No results for input(s): AMMONIA in the last 168 hours. Cardiac Enzymes: No results for input(s): CKTOTAL, CKMB, CKMBINDEX, TROPONINI in the last 168 hours. BNP (last 3 results) Recent Labs    01/13/24 1936 01/16/24 1954 03/10/24 0601  BNP 2,455.2* 716.5* 1,475.2*    ProBNP (last 3 results) No results for input(s): PROBNP in the last 8760 hours.  CBG: No results for input(s): GLUCAP in the last 168 hours. No results found for this or any previous visit (from the past 240 hours).   Studies: No results found.    Carney Saxton, MD  [facility] 08/13/2024  If 7PM-7AM, please contact night-coverage

## 2024-08-13 NOTE — Plan of Care (Signed)

## 2024-08-14 DIAGNOSIS — I639 Cerebral infarction, unspecified: Secondary | ICD-10-CM | POA: Diagnosis not present

## 2024-08-14 NOTE — Progress Notes (Addendum)
 PROGRESS NOTE  [name] FMW:[id number] DOB: November 15, 1966 DOA: 03/04/2024 PCP: Tanda Bleacher, MD   LOS: 163 days   Brief narrative: [name] is a 58 y.o. male with PMH significant for homelessness, diabetes mellitus type 2, hypertension, CHF, bipolar disorder, schizoaffective disorder, cocaine abuse, COPD presented to the hospital on 03/04/2024 for slurred speech confusion and right-sided weakness.  CT head scan showed a left MCA infarct and M2 occlusion on CTA of the head.  2D echocardiogram showed large left ventricular thrombus with EF of less than 20%.  Cardiology was consulted and patient was started on anticoagulation.  PT evaluated the patient and recommended skilled nursing facility but patient lacked medical decision capacity/status and not amenable to determining his discharge disposition or leave AGAINST MEDICAL ADVICE.  Subsequently on 08/08/2024 patient has been considered competent by discharge for making his own decisions and TOC is proceeding on placement    Assessment/Plan: Principal Problem:   Acute CVA (cerebrovascular accident) (HCC) Active Problems:   LV (left ventricular) mural thrombus   Chronic systolic CHF (congestive heart failure) (HCC)   Prolonged QT interval   Elevated liver enzymes   COPD (chronic obstructive pulmonary disease) (HCC)   Cocaine abuse (HCC)   Schizoaffective disorder (HCC)   Benign essential HTN   Acid reflux   History of DVT (deep vein thrombosis)   Type 2 diabetes mellitus (HCC)   Status post bilateral BKA.   Dilated cardiomyopathy (HCC)  Acute CVA (cerebrovascular accident)   left MCA territory infarct, involving caudate nucleus, lentiform nucleus and internal capsule confirmed on MRI. TEE was noted for large mobile left lower clot just under the mitral valve with an EF of 20% and a small right apical clot with reduced RV systolic function.  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 Continue Coreg , Lasix , lisinopril , Aldactone , hydralazine , Xarelto .  Currently stable   Schizoaffective disorder / bipolar disorder H/o polysubstance abuse Seen by psychiatry during hospitalization..Continue Latuda , gabapentin .  Had a prolonged hospitalization due to lack of competency but recently has been considered competence by judge on 08/08/2024.   Hyperlipidemia Continue Crestor     Diabetes mellitus type 2 Review of last last A1c of 5.4.   Ensure enlive PO bid . Continue gabapentin .   Polysubstance abuse Continue Ativan  Latuda  and oxycodone .    Deconditioning: Ambulatory status complicated by previous right AKA and left BKA,  has no insurance coverage for SNF.  Refuses therapy at times.   H/o COPD: Continue albuterol  q6 hr PRN nebs   H/o DVT: Xarelto  20 mg PO daily  DVT prophylaxis:  rivaroxaban  (XARELTO ) tablet 20 mg   Disposition: Difficult disposition.  Medically stable for disposition  Status is: Inpatient Remains inpatient appropriate because: Difficult disposition pending placement.  TOC on board    Code Status:     Code Status: Full Code  Family Communication: None  Consultants: Psychiatry  Procedures: None  Anti-infectives:  None  Anti-infectives (From admission, onward)    None        Subjective: Today, patient was seen and examined at bedside.  Denies any nausea vomiting fever chills or rigor.  No shortness of breath cough  Objective: Vitals:   08/13/24 0850 08/13/24 1951  BP: 94/65 (!) 149/111  Pulse: 63 66  Resp: 18 18  SpO2: 100% 100%    Intake/Output Summary (Last 24 hours) at 08/14/2024 0823 Last data filed at 08/14/2024 9359 Gross  per 24 hour  Intake --  Output 450 ml  Net -450 ml   Filed Weights   03/26/24 0431 03/28/24 0456 04/04/24 0500  Weight: 42.7 kg 47.5 kg 61.7 kg   Body mass index is 20.09 kg/m.   Physical Exam:  GENERAL: Patient is alert  awake not in distress, lying in bed. HENT: No scleral pallor or icterus. Pupils equally reactive to light. Oral mucosa is moist NECK: is supple, no gross swelling noted. CHEST: Clear to auscultation. No crackles or wheezes.   CVS: S1 and S2 heard, no murmur. Regular rate and rhythm.  ABDOMEN: Soft, non-tender, bowel sounds are present. EXTREMITIES right AKA, left below-knee amputation CNS: Cranial nerves are intact. SKIN: warm and dry without rashes.  Data Review: I have personally reviewed the following laboratory data and studies,  CBC: No results for input(s): WBC, NEUTROABS, HGB, HCT, MCV, PLT in the last 168 hours. Basic Metabolic Panel: No results for input(s): NA, K, CL, CO2, GLUCOSE, BUN, CREATININE, CALCIUM , MG, PHOS in the last 168 hours. Liver Function Tests: No results for input(s): AST, ALT, ALKPHOS, BILITOT, PROT, ALBUMIN in the last 168 hours. No results for input(s): LIPASE, AMYLASE in the last 168 hours. No results for input(s): AMMONIA in the last 168 hours. Cardiac Enzymes: No results for input(s): CKTOTAL, CKMB, CKMBINDEX, TROPONINI in the last 168 hours. BNP (last 3 results) Recent Labs    01/13/24 1936 01/16/24 1954 03/10/24 0601  BNP 2,455.2* 716.5* 1,475.2*    ProBNP (last 3 results) No results for input(s): PROBNP in the last 8760 hours.  CBG: No results for input(s): GLUCAP in the last 168 hours. No results found for this or any previous visit (from the past 240 hours).   Studies: No results found.    Ellianah Cordy, MD  [facility] 08/14/2024  If 7PM-7AM, please contact night-coverage

## 2024-08-15 DIAGNOSIS — I639 Cerebral infarction, unspecified: Secondary | ICD-10-CM | POA: Diagnosis not present

## 2024-08-15 NOTE — Progress Notes (Signed)
 PROGRESS NOTE  [name]  DOB: 10/18/1966  PCP: Tanda Bleacher, MD FMW:[id number]  DOA: 03/04/2024  LOS: 164 days  Hospital Day: 165  Brief narrative: [name] is a 58 y.o. male with PMH significant for homelessness, DM2, HTN, CHF, bipolar disorder, schizoaffective disorder, cocaine abuse, COPD Patient is a to the ED 03/04/24 for slower speech, confusion and right-sided weakness CT head showed left MCA infarct CTA head and neck revealed an M2 occlusion on the left,   Stroke workup was completed Echocardiogram revealed large mobile left ventricular thrombus on the left and right ventricular apical thrombus in the right, ejection fraction less than 20%.   Cardiology was consulted recommended DOAC.  Initially psychiatry had deemed the patient to lack capacity. Per psychiatry note from 9/9, patient was found to competency by a judge on 08/08/2024.  Subjective: Patient was seen and examined this morning. Sitting on on wheelchair.  Not in distress.  Alert, awake, Pending outpatient housing arrangement for discharge.  Assessment and plan: Acute CVA (cerebrovascular accident)  He underwent extensive workup and imaging revealed left MCA territory infarct, involving caudate nucleus, lentiform nucleus and internal capsule confirmed on MRI. TEE was noted for large mobile left lower clot just under the mitral valve with an EF of 20% and a small right apical clot with reduced RV systolic function.  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 Blood pressure in low normal range. Continue anticoagulation with Xarelto    Schizoaffective disorder / bipolar disorder H/o polysubstance abuse Lacks medical capacity: Initially psychiatry had deemed the patient to lack capacity. Per psychiatry note from 9/9, patient was found to  competency by a judge on 08/08/2024. Refusing labs Psych meds per psychiatry.  Currently on Latuda , gabapentin    Hyperlipidemia Continue Crestor  5 mg PO at bedtime.   Diabetes mellitus type 2 last A1c of 5.4. refuses monitoring  Ensure enlive PO bid  Gabapentin  200 mg PO bid    Polysubstance abuse Ativan  1 mg PO q6 hr PRN  Latuda  40 mg PO daily  Oxycodone  10 mg PO q3 hr PRN    Deconditioning: Ambulatory status complicated by previous right AKA and left BKA,  has no insurance coverage for SNF. refusing therapy at times. Meclizine  25 mg PO tid PRN    H/o COPD: Albuterol  q6 hr PRN nebs   H/o DVT: Xarelto  20 mg PO daily    Mobility:  PT Orders: Active   PT Follow up Rec: Home Health Pt (Followed By Outpt Pt For Continuing Prosthesis Training; [name] Manage Safely In An Accessible Living Space With Assist/Supervision For Medications, Meals)08/10/2024 1700   Goals of care   Code Status: Full Code     DVT prophylaxis:   rivaroxaban  (XARELTO ) tablet 20 mg   Antimicrobials: None currently Fluid: None Consultants: Psychiatry Family Communication: None at bedside  Status: Inpatient Level of care:  Med-Surg   Patient is from: Homeless Anticipated d/c to: Difficulty disposition.   Diet:  Diet Order             Diet regular Fluid consistency: Thin  Diet effective now           Diet - low sodium heart healthy                   Scheduled Meds:  carvedilol   10 mg Oral Daily   furosemide   40 mg Oral  Daily   gabapentin   200 mg Oral BID   lisinopril   40 mg Oral Daily   lurasidone   40 mg Oral Q breakfast   rivaroxaban   20 mg Oral QHS   rosuvastatin   5 mg Oral QHS   spironolactone   25 mg Oral Daily    PRN meds: albuterol , feeding supplement, hydrALAZINE , labetalol , lactulose , meclizine , mouth rinse, oxyCODONE    Infusions:    Antimicrobials: Anti-infectives (From admission, onward)    None       Objective: Vitals:   08/15/24 1158 08/15/24  1531  BP: (!) 151/75 98/77  Pulse: 64 63  Resp: 20 16  SpO2: 100% 100%   No intake or output data in the 24 hours ending 08/15/24 1543     Filed Weights   03/26/24 0431 03/28/24 0456 04/04/24 0500  Weight: 42.7 kg 47.5 kg 61.7 kg   Weight change:  Body mass index is 20.09 kg/m.   Physical Exam: General exam: Pleasant, calm, comfortable on wheelchair Skin: No rashes, lesions or ulcers. HEENT: Atraumatic, normocephalic, no obvious bleeding Lungs: Clear to auscultation bilaterally,  CVS: S1, S2, no murmur,   GI/Abd: Soft, nontender, nondistended, bowel sound present,   CNS: Alert, awake, Psychiatry: Mood appropriate Extremities: Right AKA, left BKA status  Data Review: I have personally reviewed the laboratory data and studies available.  F/u labs  Unresulted Labs (From admission, onward)     Start     Ordered   08/10/24 0500  CBC with Differential/Platelet  Tomorrow morning,   R       Question:  Specimen collection method  Answer:  Lab=Lab collect   08/09/24 1148           Signed, Chapman Rota, MD [facility] 08/15/2024

## 2024-08-15 NOTE — Progress Notes (Signed)
 Physical Therapy Treatment Note  Clinical Impression:  Arrived at [name]'s room at noon for PT and Prosthetist session; Zyren indicated he was awaiting a few important phone calls related to coordinating discharge plans, and that today "isn't a good day" to get his prosthesis and start gait training;  I contacted his prosthetist, [name], [facility], to let her know [name] would be unable to work with us  today;  [name] indicated that his plan is to leave the hospital tomorrow; I'm not sure when his pro bono R prosthesis will be able to be delivered; I am in contact with [name], and will update [name] and the team as soon as I know more;  Discussion:  Right now, it is clear that [name]'s priority is to be able to get out of the hospital, and start again in [place], which is understandable; He can function overall safely at [facility] level at this time; While getting his R prosthesis is an integral part of his goal to walk again, it is not truly necessary for him to be able to leave the hospital;   In fact, without the ability to fully gait train well with PT and the prosthesis, the prosthetic knee presents quite a fall risk; Given the timing right now, possibly getting his prosthesis delivered tomorrow, we lack time and the ability for quality gait training;   I highly recommend follow up with [facility] (they have an office in [place]), and Outpt PT for quality gait and prosthetic training; If [name] is to discharge before he is safe to use the R prosthesis, I wonder if we can send it to the [facility] office in [place];  Will continue to follow;   [name], PT  Acute Rehabilitation Services Office [phone] Secure Chat welcomed   Addendum: Spoke with [name], Monday evening; she is able to reschedule prosthesis delivery and fit to tomorrow, 9/16 at noon; Also, a different [facility] /clinic office would not be able to receive a prosthesis from another office that is  not yet fit checked with CPO from office where it was made (so, to send it to [facility] in [place] is not an option)     08/15/24 1700  PT Time Calculation  PT Start Time (ACUTE ONLY) 1158  PT Stop Time (ACUTE ONLY) 1212  PT Time Calculation (min) (ACUTE ONLY) 14 min  PT General Charges  $$ ACUTE PT VISIT 1 Visit   No Charge

## 2024-08-15 NOTE — Progress Notes (Signed)
 Speech Language Pathology Treatment: Cognitive-Linguistic  Patient Details Name: [name] MRN: [id number] DOB: [birth year] Today's Date: 08/15/2024 Time: 8491-8478 SLP Time Calculation (min) (ACUTE ONLY): 13 min  Assessment / Plan / Recommendation Clinical Impression  Attempted to see pt earlier this morning, alongside PT, but pt reported that it was not a good time. He was agreeable to SLP returning this afternoon, but still stressed that it was not a good day. Session focused primarily on awareness and verbal problem solving as [name] talked about therapies as well as discharge planning. SLP offered Mod cues, although he was a little less receptive to feedback today. Note that despite frustration, he had only one overt instance of word-finding difficulty today, which appears to be progress given that this is when he would typically have the most trouble. He is still open to ongoing SLP f/u acutely until he discharges, so will continue to follow as able.    HPI HPI: Patient is 58 y.o. male presented to ED on 03/04/24 with stroke like symptoms when caregiver found him with slurred speech and right sided weakness. Head CT reveals Acute left MCA territory infarct affecting the insula, caudate nucleus, lentiform nucleus and internal capsule. Pt initially evaluated by SLP 4/5 but s/o 4/22 due to repeated refusals. SLP reordered 5/13 for repeat cognitive-linguistic eval.   PMH significant of  Schizoaffective disorder, heart failure with reduced ejection fraction, pretension, DMII, COPD, s/p bilateral amputations, chronic pain, and polysubstance abuse.      SLP Plan  Continue with current plan of care          Recommendations                          (assistance with meals/meds/finances) Cognitive communication deficit (M58.158)     Continue with current plan of care     [name]., M.A. CCC-SLP Acute Rehabilitation Services Office: [phone]  Secure chat  preferred   08/15/2024, 3:34 PM

## 2024-08-16 ENCOUNTER — Other Ambulatory Visit (HOSPITAL_COMMUNITY): Payer: Self-pay

## 2024-08-16 DIAGNOSIS — I639 Cerebral infarction, unspecified: Secondary | ICD-10-CM | POA: Diagnosis not present

## 2024-08-16 MED ORDER — ENSURE ENLIVE PO LIQD
237.0000 mL | Freq: Two times a day (BID) | ORAL | 12 refills | Status: AC | PRN
  Filled 2024-08-16: qty 237, 1d supply, fill #0

## 2024-08-16 MED ORDER — LURASIDONE HCL 40 MG PO TABS
40.0000 mg | ORAL_TABLET | Freq: Every day | ORAL | 0 refills | Status: AC
  Filled 2024-08-16: qty 30, 30d supply, fill #0

## 2024-08-16 MED ORDER — SPIRONOLACTONE 25 MG PO TABS
25.0000 mg | ORAL_TABLET | Freq: Every day | ORAL | 0 refills | Status: AC
  Filled 2024-08-16: qty 30, 30d supply, fill #0

## 2024-08-16 MED ORDER — CARVEDILOL PHOSPHATE ER 10 MG PO CP24
10.0000 mg | ORAL_CAPSULE | Freq: Every day | ORAL | 0 refills | Status: AC
  Filled 2024-08-16: qty 30, 30d supply, fill #0

## 2024-08-16 MED ORDER — RIVAROXABAN 20 MG PO TABS
20.0000 mg | ORAL_TABLET | Freq: Every day | ORAL | 0 refills | Status: AC
  Filled 2024-08-16: qty 30, 30d supply, fill #0

## 2024-08-16 MED ORDER — GABAPENTIN 100 MG PO CAPS
200.0000 mg | ORAL_CAPSULE | Freq: Two times a day (BID) | ORAL | 0 refills | Status: AC
  Filled 2024-08-16: qty 120, 30d supply, fill #0

## 2024-08-16 MED ORDER — ALBUTEROL SULFATE HFA 108 (90 BASE) MCG/ACT IN AERS
2.0000 | INHALATION_SPRAY | RESPIRATORY_TRACT | 0 refills | Status: AC | PRN
  Filled 2024-08-16: qty 18, 30d supply, fill #0

## 2024-08-16 MED ORDER — FUROSEMIDE 40 MG PO TABS
40.0000 mg | ORAL_TABLET | Freq: Every day | ORAL | 0 refills | Status: AC
  Filled 2024-08-16: qty 30, 30d supply, fill #0

## 2024-08-16 MED ORDER — OXYCODONE HCL 10 MG PO TABS
10.0000 mg | ORAL_TABLET | Freq: Three times a day (TID) | ORAL | 0 refills | Status: AC | PRN
  Filled 2024-08-16: qty 15, 5d supply, fill #0

## 2024-08-16 MED ORDER — LISINOPRIL 40 MG PO TABS
40.0000 mg | ORAL_TABLET | Freq: Every day | ORAL | 0 refills | Status: AC
  Filled 2024-08-16: qty 30, 30d supply, fill #0

## 2024-08-16 NOTE — TOC Progression Note (Signed)
 Transition of Care [facility]) - Progression Note    Patient Details  Name: [name] MRN: [id number] Date of Birth: [birth year]  [facility] CM/SW Contact  [name], [name] Phone Number: 08/16/2024, 1:41 PM  Clinical Narrative:   CSW coordinated with Joppa with [name] that the patient says he cannot afford to pay the rent. Joppa investigated the charges further, he will need to pay $390 to move in, but the owner wants next month's rent as a deposit. CSW met with patient, he can pay the $390 but cannot afford the next month until he gets paid again. CSW relayed information, and the owner is in agreement for the patient to move in.   Owner reported that [address] is a good ACT team in [place]. CSW met with patient to discuss and review, and he is in agreement. CSW to contact [employer] to provide referral.   CSW confirmed with Joppa that she can provide transportation for the patient tomorrow. Patient in agreement, excited to be leaving the hospital.   CSW coordinated with PT for suggestions on therapy follow up for the prosthetics training. CSW to follow.    Expected Discharge Plan: Home/Self Care Barriers to Discharge: Homeless with medical needs, Financial Resources               Expected Discharge Plan and Services       Living arrangements for the past 2 months: Apartment Expected Discharge Date: 03/14/24                                     Social Drivers of Health (SDOH) Interventions SDOH Screenings   Food Insecurity: Food Insecurity Present (03/06/2024)  Housing: High Risk (03/06/2024)  Transportation Needs: No Transportation Needs (03/06/2024)  Utilities: Patient Declined (03/06/2024)  Alcohol Screen: Medium Risk (06/24/2019)  Depression (PHQ2-9): Low Risk  (02/18/2024)  Recent Concern: Depression (PHQ2-9) - Medium Risk (01/21/2024)  Financial Resource Strain: High Risk (07/24/2021)   Received from [facility]   Physical Activity: Inactive (02/18/2024)  Social Connections: Unknown (04/05/2022)   Received from [facility]  Stress: Stress Concern Present (07/24/2021)   Received from [facility]  Tobacco Use: High Risk (03/04/2024)  Health Literacy: Adequate Health Literacy (02/18/2024)    Readmission Risk Interventions    12/21/2023    9:25 AM 12/09/2022    9:02 AM 12/01/2022    3:56 PM  Readmission Risk Prevention Plan  Transportation Screening Complete Complete Complete  HRI or Home Care Consult Complete    Social Work Consult for Recovery Care Planning/Counseling Complete    Palliative Care Screening Not Applicable    Medication Review [employer]) Complete Complete Complete  PCP or Specialist appointment within 3-5 days of discharge  Complete Complete  HRI or Home Care Consult  Complete Complete  SW Recovery Care/Counseling Consult  Complete Complete  Palliative Care Screening  Not Applicable Not Applicable  Skilled Nursing Facility  Not Applicable Not Applicable  SNF Comments   n/a

## 2024-08-16 NOTE — Progress Notes (Signed)
 Physical Therapy Note  Discussed with [name], Prosthetist, the likelihood that [name] will be leaving for [facility], 9/17;  We have made a plan to meet up early tomorrow (approximately 7:30am), to deliver Pritesh's R prosthesis and hopefully be able to fit check; I did not get the opportunity to let Zebediah know this plan before leaving today;   [name], PT  Acute Rehabilitation Services Office [phone] Secure Chat welcomed

## 2024-08-16 NOTE — Progress Notes (Signed)
 Speech Language Pathology Treatment: Cognitive-Linguistic  Patient Details Name: [name] MRN: [id number] DOB: 1966/08/03 Today's Date: 08/16/2024 Time: 8492-8478 SLP Time Calculation (min) (ACUTE ONLY): 14 min  Assessment / Plan / Recommendation Clinical Impression  Pt was seen again this afternoon with focus again primarily on awareness and problem solving. Pt shows good recall of events over the last day especially pertaining to d/c planning, as supported by review of chart. He hopes he will have a ride by 9 am and does accurate calculations in his head to determine how many hours ahead that will be. He acknowledges that he tried his prosthesis earlier today with PT and OT. He recalled that his prosthesis needs some adjustments, and so it was not an ideal fit and it will need to be further modified. He shared that he took some steps, but when questioned about if he needed assistance or not, he deflected the question. He does seem to have shifted his thinking, having said that he wanted the prosthesis on previous date, but now stating that he will use a wheelchair (hoping for a power chair). SLP again offered Mod cues for safety awareness and anticipatory awareness pertaining to his discharge planning and safety after discharge. Pt listened to SLP but was primarily focused on getting out. SLP will remain on board acutely pending any changes to plan.    HPI HPI: Patient is 58 y.o. male presented to ED on 03/04/24 with stroke like symptoms when caregiver found him with slurred speech and right sided weakness. Head CT reveals Acute left MCA territory infarct affecting the insula, caudate nucleus, lentiform nucleus and internal capsule. Pt initially evaluated by SLP 4/5 but s/o 4/22 due to repeated refusals. SLP reordered 5/13 for repeat cognitive-linguistic eval.   PMH significant of  Schizoaffective disorder, heart failure with reduced ejection fraction, pretension, DMII, COPD, s/p bilateral  amputations, chronic pain, and polysubstance abuse.      SLP Plan  Continue with current plan of care          Recommendations                     Oral care QID   Intermittent Supervision/Assistance (assist with meals/meds/finances) Cognitive communication deficit (M58.158)     Continue with current plan of care     Leita SAILOR., M.A. CCC-SLP Acute Rehabilitation Services Office: [phone]  Secure chat preferred   08/16/2024, 3:30 PM

## 2024-08-16 NOTE — TOC Progression Note (Signed)
 Transition of Care [facility]) - Progression Note    Patient Details  Name: [name] MRN: [id number] Date of Birth: February 03, 1966  Transition of Care [facility]) CM/SW Contact  [name], [name] Phone Number: 08/16/2024, 12:28 PM  Clinical Narrative:   CSW spoke with Joppa with [name] to obtain number for Avelina, owner of the boarding house that patient is looking to move into. Number is [phone]. CSW contacted Avelina to discuss, patient will need setup with a new PCP, medication management for psychiatric medications, and he'll need a money order for the September rent as well as next month, because she doesn't want to have to fight with him on it. Avelina can accept the patient on Wednesday if all of that is arranged. CSW met with patient to discuss, he says he doesn't have the money to pay right now. Patient in agreement with CSW arranging providers per the owner's recommendations, also asking about ACT team. CSW contacted Avelina to ask about ACT team, awaiting response.   CSW contacted [facility] to arrange med management for patient, next available scheduled for October 28th at 2:20. CSW contacted primary care clinics: - [facility]: left a voicemail, awaiting call back - [facility]: does not take [name] [facility]: CSW discussed patient's needs, they are unable to accept the patient - [facility]: left a voicemail - [facility]: new patient appointment scheduled for 9/30 at 11:30 am  CSW spoke with Joppa, she is checking on any financial assistance that can be provided for the patient for his rent. She is available to provide transportation on Wednesday.   CSW spoke with Rocky Pouch. Per Rocky, the patient has a payee setup for his special assistance Medicaid dollars, with [employer]. CSW contacted [place], they did have the patient as a client previously, but their last deposit of his  money was in February, and the last pay out was in May. They no longer have any funds that they are managing for the patient. CSW met with patient to provide update.    Expected Discharge Plan: Home/Self Care Barriers to Discharge: Homeless with medical needs, Financial Resources               Expected Discharge Plan and Services       Living arrangements for the past 2 months: Apartment Expected Discharge Date: 03/14/24                                     Social Drivers of Health (SDOH) Interventions SDOH Screenings   Food Insecurity: Food Insecurity Present (03/06/2024)  Housing: High Risk (03/06/2024)  Transportation Needs: No Transportation Needs (03/06/2024)  Utilities: Patient Declined (03/06/2024)  Alcohol Screen: Medium Risk (06/24/2019)  Depression (PHQ2-9): Low Risk  (02/18/2024)  Recent Concern: Depression (PHQ2-9) - Medium Risk (01/21/2024)  Financial Resource Strain: High Risk (07/24/2021)   Received from [facility]  Physical Activity: Inactive (02/18/2024)  Social Connections: Unknown (04/05/2022)   Received from [facility]  Stress: Stress Concern Present (07/24/2021)   Received from [facility]  Tobacco Use: High Risk (03/04/2024)  Health Literacy: Adequate Health Literacy (02/18/2024)    Readmission Risk Interventions    12/21/2023    9:25 AM 12/09/2022    9:02 AM 12/01/2022    3:56 PM  Readmission Risk Prevention Plan  Transportation Screening Complete Complete Complete  HRI or Home  Care Consult Complete    Social Work Consult for Recovery Care Planning/Counseling Complete    Palliative Care Screening Not Applicable    Medication Review Oceanographer) Complete Complete Complete  PCP or Specialist appointment within 3-5 days of discharge  Complete Complete  HRI or Home Care Consult  Complete Complete  SW Recovery Care/Counseling Consult  Complete Complete  Palliative Care Screening  Not Applicable Not Applicable  Skilled Nursing Facility  Not  Applicable Not Applicable  SNF Comments   n/a

## 2024-08-16 NOTE — Progress Notes (Signed)
 PROGRESS NOTE  [name]  DOB: Jul 02, 1966  PCP: Tanda Bleacher, MD FMW:[id number]  DOA: 03/04/2024  LOS: 165 days  Hospital Day: 166  Brief narrative: [name] is a 58 y.o. male with PMH significant for homelessness, DM2, HTN, CHF, bipolar disorder, schizoaffective disorder, cocaine abuse, COPD Patient is a to the ED 03/04/24 for slower speech, confusion and right-sided weakness CT head showed left MCA infarct CTA head and neck revealed an M2 occlusion on the left,   Stroke workup was completed Echocardiogram revealed large mobile left ventricular thrombus on the left and right ventricular apical thrombus in the right, ejection fraction less than 20%.   Cardiology was consulted recommended DOAC.  Initially psychiatry had deemed the patient to lack capacity. Per psychiatry note from 9/9, patient was found to competency by a judge on 08/08/2024.  Subjective: Patient was seen and examined this morning. Sitting on on wheelchair.  Not in distress.  Alert, awake, Irritated. Preparation in place for discharge home with home health.  Assessment and plan: Acute CVA (cerebrovascular accident)  He underwent extensive workup and imaging revealed left MCA territory infarct, involving caudate nucleus, lentiform nucleus and internal capsule confirmed on MRI. TEE was noted for large mobile left lower clot just under the mitral valve with an EF of 20% and a small right apical clot with reduced RV systolic function.  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 Blood pressure in low normal range. Continue anticoagulation with Xarelto    Schizoaffective disorder / bipolar disorder H/o polysubstance abuse Lacks medical capacity: Initially psychiatry had deemed the patient to lack capacity. Per psychiatry note from 9/9, patient was  found to competency by a judge on 08/08/2024. Refusing labs Psych meds per psychiatry.  Currently on Latuda , gabapentin    Hyperlipidemia Continue Crestor  5 mg PO at bedtime.   Diabetes mellitus type 2 last A1c of 5.4. refuses monitoring  Ensure enlive PO bid  Gabapentin  200 mg PO bid    Polysubstance abuse Ativan  1 mg PO q6 hr PRN  Latuda  40 mg PO daily  Oxycodone  10 mg PO q3 hr PRN    Deconditioning: Ambulatory status complicated by previous right AKA and left BKA,  has no insurance coverage for SNF. refusing therapy at times. Meclizine  25 mg PO tid PRN    H/o COPD: Albuterol  q6 hr PRN nebs   H/o DVT: Xarelto  20 mg PO daily    Mobility:  PT Orders: Active   PT Follow up Rec: Home Health Pt (Followed By Outpt Pt For Continuing Prosthesis Training; [name] Manage Safely In An Accessible Living Space With Assist/Supervision For Medications, Meals)08/10/2024 1700   Goals of care   Code Status: Full Code     DVT prophylaxis:   rivaroxaban  (XARELTO ) tablet 20 mg   Antimicrobials: None currently Fluid: None Consultants: Psychiatry Family Communication: None at bedside  Status: Inpatient Level of care:  Med-Surg   Patient is from: Homeless Anticipated d/c to: Difficulty disposition.   Diet:  Diet Order             Diet regular Fluid consistency: Thin  Diet effective now           Diet - low sodium heart healthy                   Scheduled Meds:  carvedilol   10 mg Oral Daily   furosemide   40 mg Oral Daily   gabapentin   200 mg Oral BID   lisinopril   40 mg Oral Daily   lurasidone   40 mg Oral Q breakfast   rivaroxaban   20 mg Oral QHS   rosuvastatin   5 mg Oral QHS   spironolactone   25 mg Oral Daily    PRN meds: albuterol , feeding supplement, hydrALAZINE , labetalol , lactulose , meclizine , mouth rinse, oxyCODONE    Infusions:    Antimicrobials: Anti-infectives (From admission, onward)    None       Objective: Vitals:   08/15/24 2120  08/16/24 0816  BP:  128/70  Pulse:  67  Resp:  16  Temp: (!) 97.4 F (36.3 C) (!) 96.7 F (35.9 C)  SpO2:  100%   No intake or output data in the 24 hours ending 08/16/24 1026     Filed Weights   03/26/24 0431 03/28/24 0456 04/04/24 0500  Weight: 42.7 kg 47.5 kg 61.7 kg   Weight change:  Body mass index is 20.09 kg/m.   Physical Exam: General exam: Pleasant, calm, comfortable on wheelchair Skin: No rashes, lesions or ulcers. HEENT: Atraumatic, normocephalic, no obvious bleeding Lungs: Clear to auscultation bilaterally,  CVS: S1, S2, no murmur,   GI/Abd: Soft, nontender, nondistended, bowel sound present,   CNS: Alert, awake, Psychiatry: irritated. Extremities: Right AKA, left BKA status  Data Review: I have personally reviewed the laboratory data and studies available.  F/u labs  Unresulted Labs (From admission, onward)     Start     Ordered   08/10/24 0500  CBC with Differential/Platelet  Tomorrow morning,   R       Question:  Specimen collection method  Answer:  Lab=Lab collect   08/09/24 1148           Signed, Chapman Rota, MD [facility] 08/16/2024

## 2024-08-16 NOTE — Progress Notes (Addendum)
 Physical Therapy Treatment Patient Details Name: [name] MRN: [id number] DOB: 12-01-1966 Today's Date: 08/16/2024   History of Present Illness Patient is 58 y.o. male presented to ED on 03/04/24 with stroke like symptoms when caregiver found him with slurred speech and right sided weakness. Head CT reveals Acute left MCA territory infarct affecting the insula, caudate nucleus, lentiform nucleus and internal capsule. As of August, pt has been working toward getting and fitting for a transfemoral prosthesis; PMH significant of  Schizoaffective disorder, heart failure with reduced ejection fraction, pretension, DMII, COPD, s/p bilateral amputations, chronic pain, and polysubstance abuse.    PT Comments  Continuing work on functional mobility and activity tolerance;  Session focused on standing on R and L prostheses, and checking fit of R Prosthesis specifically; [name], Prosthetist, present for fit check; [name] stood x3 in parallel bars, worked on [name] shifting; COTA joined us  for standing and taking steps, which [name] was able to do with assist; Still not quite able to get RLE fully down into the prosthesis, and CPO brought the prosthesis back to her office for adjustment;   Noting plan for [name] to leave for [employer]; [name], [facility], discussed the need for [name] to follow up with [facility] in [place] (phone number [phone], and continue with Outpt PT for prosthetics training; This PT called [facility] in [place] and left a message for Prosthetist there to give us  some recommendations for PT clinics in the area that are particularly good with prosthetics;   Will contact [name], [facility], over the course of the evening to let her know of [name]'s plans to leave for [name], and see if we can arrange for getting his prosthesis to [place] before he goes;      If plan is discharge home, recommend the following: Supervision due to cognitive status;Direct  supervision/assist for financial management;Direct supervision/assist for medications management;Assist for transportation   Can travel by private vehicle     Yes  Equipment Recommendations  Rolling walker (2 wheels) (will consider crutches)    Recommendations for Other Services Speech consult;OT consult (Prosthetist)     Precautions / Restrictions Precautions Precautions: Fall Recall of Precautions/Restrictions: Impaired Precaution/Restrictions Comments: h/o BLE amputations Required Braces or Orthoses: Other Brace Other Brace: L prosthesis; and R prosthesis with Prosthetist present to check socket fit Restrictions Weight Bearing Restrictions Per Provider Order: No     Mobility  Bed Mobility               General bed mobility comments: OOB in wheelchair    Transfers Overall transfer level: Needs assistance Equipment used:  (parallel bars) Transfers: Sit to/from Stand Sit to Stand: Mod assist           General transfer comment: Mod assist ot power up; Difficulty with flexing R prosthetic knee, and needed assist to kick extended leg on R forward in order to sit back to wheelchair  Unable to get to fully upright standing    Ambulation/Gait Ambulation/Gait assistance: Mod assist, +2 physical assistance Gait Distance (Feet): 4 Feet (over 3 bouts of standing and stepping in parallel bars) Assistive device:  (prallel bars)         General Gait Details: Took a few steps each bout; Heavy Mod assist to weight shift onto RLE to advance LLE; close guard of R knee   Stairs             Wheelchair Mobility     Tilt Bed    Modified Rankin (Stroke  Patients Only)       Balance     Sitting balance-Leahy Scale: Good       Standing balance-Leahy Scale: Poor Standing balance comment: stood in parallel bars with heavy reliance on BUE support                            Communication Communication Communication: Impaired Factors Affecting  Communication: Difficulty expressing self;Reduced clarity of speech  Cognition Arousal: Alert Behavior During Therapy: WFL for tasks assessed/performed                             Following commands: Intact Following commands impaired: Follows multi-step commands with increased time    Cueing Cueing Techniques: Verbal cues, Tactile cues  Exercises      General Comments General comments (skin integrity, edema, etc.): Prosthetist joined us  for prosthetic fitting; Not quite able to get RLE all the way down into socket; will need more modification to the socket; in communication with Prosthetist re: scheduling possibly for tomorrow      Pertinent Vitals/Pain Pain Assessment Pain Intervention(s): Monitored during session    Home Living                          Prior Function            PT Goals (current goals can now be found in the care plan section) Acute Rehab PT Goals Patient Stated Goal: Agreeable to working on prosthesis training PT Goal Formulation: With patient Time For Goal Achievement: 08/30/24 Potential to Achieve Goals: Fair Progress towards PT goals: Progressing toward goals    Frequency    Min 1X/week      PT Plan      Co-evaluation PT/OT/SLP Co-Evaluation/Treatment: Yes Reason for Co-Treatment: For patient/therapist safety;To address functional/ADL transfers PT goals addressed during session: Mobility/safety with mobility OT goals addressed during session: ADL's and self-care      AM-PAC PT 6 Clicks Mobility   Outcome Measure  Help needed turning from your back to your side while in a flat bed without using bedrails?: None Help needed moving from lying on your back to sitting on the side of a flat bed without using bedrails?: None Help needed moving to and from a bed to a chair (including a wheelchair)?: A Little Help needed standing up from a chair using your arms (e.g., wheelchair or bedside chair)?: A Lot Help needed to  walk in hospital room?: Total Help needed climbing 3-5 steps with a railing? : Total 6 Click Score: 15    End of Session   Activity Tolerance: Patient tolerated treatment well Patient left: Other (comment) (in wheelchair) Nurse Communication: Mobility status PT Visit Diagnosis: Other abnormalities of gait and mobility (R26.89);Muscle weakness (generalized) (M62.81);Other symptoms and signs involving the nervous system (R29.898);Hemiplegia and hemiparesis Hemiplegia - Right/Left: Right Hemiplegia - dominant/non-dominant: Dominant Hemiplegia - caused by: Cerebral infarction     Time: 1202-1240 PT Time Calculation (min) (ACUTE ONLY): 38 min  Charges:    $Therapeutic Activity: 8-22 mins $Orthotics/Prosthetics Check: 8-22 mins PT General Charges $$ ACUTE PT VISIT: 1 Visit                     [name], PT  Acute Rehabilitation Services Office [phone] Secure Chat welcomed    [name] 08/16/2024, 4:00 PM

## 2024-08-16 NOTE — Progress Notes (Signed)
 Occupational Therapy Treatment Patient Details Name: [name] MRN: [id number] DOB: [birth year] Today's Date: 08/16/2024   History of present illness Patient is 58 y.o. male presented to ED on 03/04/24 with stroke like symptoms when caregiver found him with slurred speech and right sided weakness. Head CT reveals Acute left MCA territory infarct affecting the insula, caudate nucleus, lentiform nucleus and internal capsule. As of August, pt has been working toward getting and fitting for a transfemoral prosthesis; PMH significant of  Schizoaffective disorder, heart failure with reduced ejection fraction, pretension, DMII, COPD, s/p bilateral amputations, chronic pain, and polysubstance abuse.   OT comments  Patient continues to require supervision for safety for tub bench transfers and bathing in shower. Patient able to dress upper body but requires assistance for LB dressing while in wheelchair.  Patient seen with PT following self care tasks to address standing in parallel bars with new prosthesis.  Patient required mod assist for sit to stand in parallel bars and assistance for balance and taking steps.  Discharge recommendations updated to [facility] due to patient is expected to discharge to [facility]  Acute OT to continue to follow to address established goals.       If plan is discharge home, recommend the following:  A little help with walking and/or transfers;A little help with bathing/dressing/bathroom;Supervision due to cognitive status;Direct supervision/assist for financial management;Direct supervision/assist for medications management   Equipment Recommendations  None recommended by OT    Recommendations for Other Services      Precautions / Restrictions Precautions Precautions: Fall Recall of Precautions/Restrictions: Impaired Precaution/Restrictions Comments: h/o BLE amputations Required Braces or Orthoses: Other Brace Other Brace: L prosthesis; and R prosthesis with  Prosthetist present to check socket fit Restrictions Weight Bearing Restrictions Per Provider Order: No       Mobility Bed Mobility Overal bed mobility: Modified Independent             General bed mobility comments: OOB in wheelchair    Transfers Overall transfer level: Needs assistance Equipment used: None (parallel bars) Transfers: Sit to/from Stand, Bed to chair/wheelchair/BSC Sit to Stand: Mod assist          Lateral/Scoot Transfers: Supervision General transfer comment: lateral scoot transfer to tub bench, patient stood in parallel bars with mod assist to stand and was able to take steps with assistance to manage prosthesis     Balance Overall balance assessment: Needs assistance Sitting-balance support: No upper extremity supported, Bilateral upper extremity supported Sitting balance-Leahy Scale: Good Sitting balance - Comments: able to maintain sitting balance on tub bench   Standing balance support: Bilateral upper extremity supported Standing balance-Leahy Scale: Poor Standing balance comment: stood in parallel bars with heavy reliance on BUE support                           ADL either performed or assessed with clinical judgement   ADL Overall ADL's : Needs assistance/impaired     Grooming: Modified independent;Sitting Grooming Details (indicate cue type and reason): chair level at sink Upper Body Bathing: Supervision/ safety;Sitting Upper Body Bathing Details (indicate cue type and reason): distant supervision for safety Lower Body Bathing: Supervison/ safety;Sitting/lateral leans Lower Body Bathing Details (indicate cue type and reason): distant supervision for safety Upper Body Dressing : Set up;Sitting Upper Body Dressing Details (indicate cue type and reason): pullover top Lower Body Dressing: Minimal assistance;Sitting/lateral leans Lower Body Dressing Details (indicate cue type and reason): patient uses upper extremities to  clear  bottom and requires assistance to pull up         Tub/ Shower Transfer: Supervision/safety;Tub transfer;Tub bench Tub/Shower Transfer Details (indicate cue type and reason): supervision for safety   General ADL Comments: supervision for safety only    Extremity/Trunk Assessment              Vision       Perception     Praxis     Communication Communication Communication: Impaired Factors Affecting Communication: Difficulty expressing self;Reduced clarity of speech   Cognition Arousal: Alert Behavior During Therapy: WFL for tasks assessed/performed Cognition: Cognition impaired Difficult to assess due to: Impaired Manufacturing systems engineer functioning impairment (select all impairments): Problem solving, Reasoning                   Following commands: Intact Following commands impaired: Follows multi-step commands with increased time      Cueing   Cueing Techniques: Verbal cues  Exercises      Shoulder Instructions       General Comments patient seen with PT for second half of treatment to address ambulating with new prosthesis    Pertinent Vitals/ Pain       Pain Assessment Pain Assessment: No/denies pain Pain Intervention(s): Monitored during session  Home Living                                          Prior Functioning/Environment              Frequency  Min 1X/week        Progress Toward Goals  OT Goals(current goals can now be found in the care plan section)  Progress towards OT goals: Progressing toward goals  Acute Rehab OT Goals Patient Stated Goal: to discharge from hospital OT Goal Formulation: With patient Time For Goal Achievement: 08/16/24 Potential to Achieve Goals: Fair ADL Goals Pt Will Perform Lower Body Bathing:  (goal met) Pt Will Perform Lower Body Dressing: with modified independence;sitting/lateral leans Pt Will Transfer to Toilet: with modified independence;bedside  commode Pt Will Perform Toileting - Clothing Manipulation and hygiene: with mod assist;sitting/lateral leans Pt Will Perform Tub/Shower Transfer: Tub transfer;tub bench;with modified independence Pt/caregiver will Perform Home Exercise Program: Right Upper extremity;Independently;With written HEP provided Additional ADL Goal #1: pt will don prosthesis mod I Additional ADL Goal #2: pt will perform squat pivot with prosthesis donned with mod I Additional ADL Goal #3: Pt will demonstrate proper safety with transfers/mobility 100% of the time during session. Additional ADL Goal #4: Pt will demonstrate appropriate safety awareness 100% of the time when positioning w/c to transfer  Plan      Co-evaluation    PT/OT/SLP Co-Evaluation/Treatment: Yes Reason for Co-Treatment: For patient/therapist safety;To address functional/ADL transfers   OT goals addressed during session: ADL's and self-care      AM-PAC OT 6 Clicks Daily Activity     Outcome Measure   Help from another person eating meals?: None Help from another person taking care of personal grooming?: A Little Help from another person toileting, which includes using toliet, bedpan, or urinal?: A Little Help from another person bathing (including washing, rinsing, drying)?: A Little Help from another person to put on and taking off regular upper body clothing?: A Little Help from another person to put on and taking off regular lower body clothing?:  A Little 6 Click Score: 19    End of Session Equipment Utilized During Treatment: Gait belt;Other (comment) (wheelchair, tub bench, parallel bars)  OT Visit Diagnosis: Unsteadiness on feet (R26.81);Other abnormalities of gait and mobility (R26.89);Muscle weakness (generalized) (M62.81);Other symptoms and signs involving the nervous system (R29.898);Other symptoms and signs involving cognitive function;Hemiplegia and hemiparesis Hemiplegia - Right/Left: Right Hemiplegia -  dominant/non-dominant: Dominant Hemiplegia - caused by: Cerebral infarction   Activity Tolerance Patient tolerated treatment well   Patient Left in chair;with call bell/phone within reach   Nurse Communication Mobility status        Time: 8856-8752 OT Time Calculation (min): 64 min  Charges: OT General Charges $OT Visit: 1 Visit OT Treatments $Self Care/Home Management : 23-37 mins $Therapeutic Activity: 8-22 mins  [name], OTA Acute Rehabilitation Services  Office [phone]   [name] 08/16/2024, 2:09 PM

## 2024-08-17 ENCOUNTER — Other Ambulatory Visit (HOSPITAL_COMMUNITY): Payer: Self-pay

## 2024-08-17 DIAGNOSIS — I639 Cerebral infarction, unspecified: Secondary | ICD-10-CM | POA: Diagnosis not present

## 2024-08-17 NOTE — Progress Notes (Signed)
 Physical Therapy Treatment Patient Details Name: [name] MRN: [id number] DOB: 1966/06/11 Today's Date: 08/17/2024   History of Present Illness Patient is 58 y.o. male presented to ED on 03/04/24 with stroke like symptoms when caregiver found him with slurred speech and right sided weakness. Head CT reveals Acute left MCA territory infarct affecting the insula, caudate nucleus, lentiform nucleus and internal capsule. As of August, pt has been working toward getting and fitting for a transfemoral prosthesis; PMH significant of  Schizoaffective disorder, heart failure with reduced ejection fraction, pretension, DMII, COPD, s/p bilateral amputations, chronic pain, and polysubstance abuse.    PT Comments  Saw [name] this morning to deliver R prosthesis; Emphasized importance of follow up with [facility] and Outpt PT in [place]; Reviewed liner washing, skin checks, and general prosthetic care; He already has written HEP (which also includes instructions for getting up off the floor, with pictures);   Of note, it's important for [name] to know that these prostheses are pro bono -- if he needs a new prosthesis made in the future, the next Prosthetist can bill for it; [facility] in [place] phone: [phone]  [name] declined going to [facility] bars for a final R prosthesis fit check this morning; Strongly recommend Outpt PT follow up for prosthetic and gait training   If plan is discharge home, recommend the following: Supervision due to cognitive status;Direct supervision/assist for financial management;Direct supervision/assist for medications management;Assist for transportation   Can travel by private vehicle     Yes  Equipment Recommendations  Rolling walker (2 wheels) (will consider crutches)    Recommendations for Other Services Speech consult;OT consult (Prosthetist)     Precautions / Restrictions Precautions Precautions: Fall Recall of Precautions/Restrictions:  Impaired Precaution/Restrictions Comments: h/o BLE amputations Required Braces or Orthoses: Other Brace Other Brace: L prosthesis; and R prosthesis with Prosthetist present to check socket fit Restrictions Weight Bearing Restrictions Per Provider Order: No                Communication   Cognition Arousal: Alert Behavior During Therapy: WFL for tasks assessed/performed                             Following commands: Intact Following commands impaired: Follows multi-step commands with increased time    Cueing Cueing Techniques: Verbal cues   Exercises      General Comments  [name], CPO, present and reinforced liner wear, skin checks, follow up instructions      Pertinent Vitals/Pain Pain Assessment Faces Pain Scale: No hurt    Home Living                          Prior Function            PT Goals (current goals can now be found in the care plan section) Acute Rehab PT Goals Patient Stated Goal: Agreeable to working on prosthesis training PT Goal Formulation: With patient Time For Goal Achievement: 08/30/24 Potential to Achieve Goals: Fair    Frequency    Min 1X/week      PT Plan      Co-evaluation PT/OT/SLP Co-Evaluation/Treatment: Yes Reason for Co-Treatment: For patient/therapist safety;To address functional/ADL transfers PT goals addressed during session: Mobility/safety with mobility        AM-PAC PT 6 Clicks Mobility   Outcome Measure  Help needed turning from your back to your side while in a flat bed  without using bedrails?: None Help needed moving from lying on your back to sitting on the side of a flat bed without using bedrails?: None Help needed moving to and from a bed to a chair (including a wheelchair)?: A Little Help needed standing up from a chair using your arms (e.g., wheelchair or bedside chair)?: A Lot Help needed to walk in hospital room?: Total Help needed climbing 3-5 steps with a railing? :  Total 6 Click Score: 15    End of Session   Activity Tolerance: Patient tolerated treatment well Patient left: Other (comment) (in wheelchair) Nurse Communication: Mobility status PT Visit Diagnosis: Other abnormalities of gait and mobility (R26.89);Muscle weakness (generalized) (M62.81);Other symptoms and signs involving the nervous system (R29.898);Hemiplegia and hemiparesis Hemiplegia - Right/Left: Right Hemiplegia - dominant/non-dominant: Dominant Hemiplegia - caused by: Cerebral infarction     Time: 0800-0815 PT Time Calculation (min) (ACUTE ONLY): 15 min  Charges:    $Self Care/Home Management: 8-22 PT General Charges $$ ACUTE PT VISIT: 1 Visit                     [name], PT  Acute Rehabilitation Services Office [phone] Secure Chat welcomed    [name] 08/17/2024, 8:39 AM

## 2024-08-17 NOTE — Progress Notes (Signed)
 Discharge Summary: DC order noted per MD. DC RN at bedside. Patient already discharged off the unit.

## 2024-08-17 NOTE — Discharge Summary (Signed)
 Physician Discharge Summary  [name] FMW:[id number] DOB: 1966/09/20 DOA: 03/04/2024  PCP: Tanda Bleacher, MD  Admit date: 03/04/2024 Discharge date: 08/17/2024  Admitted from: Home Discharge disposition: Home with home health  Recommendations at discharge:  Ensure compliance with psychiatric medicines, cardiac medicines. Cautious use of pain medicines Continue to follow-up with psychiatry as an outpatient   Brief narrative: [name] is a 58 y.o. male with PMH significant for homelessness, DM2, HTN, CHF, bipolar disorder, schizoaffective disorder, cocaine abuse, COPD Patient is a to the ED 03/04/24 for slower speech, confusion and right-sided weakness CT head showed left MCA infarct CTA head and neck revealed an M2 occlusion on the left,   Stroke workup was completed Echocardiogram revealed large mobile left ventricular thrombus on the left and right ventricular apical thrombus in the right, ejection fraction less than 20%.   Cardiology was consulted recommended DOAC.  Patient's hospitalization was prolonged for more than 5 months because of lack of safe disposition plan. Initially psychiatry had deemed the patient to lack capacity. Per psychiatry note from 9/9, patient was found to competency by a judge on 08/08/2024. Case workers were able to arrange him safe housing at [place].  Stable to discharge today.  Medicines filled through [facility] pharmacy.  Subjective: Patient was seen and examined this morning. Sitting on on wheelchair.  Not in distress.  Excited about being out of hospital today  Hospital course: Acute CVA (cerebrovascular accident)  He underwent extensive workup and imaging revealed left MCA territory infarct, involving caudate nucleus, lentiform nucleus and internal capsule confirmed on MRI. TEE was noted for large mobile left lower clot just under the mitral valve with an EF of 20% and a small right apical clot with reduced RV systolic function. On  anticoagulation with Xarelto .  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 Blood pressure in low normal range.  Recommend to ensure compliance with all cardiac medications Continue anticoagulation with Xarelto    Schizoaffective disorder / bipolar disorder H/o polysubstance abuse Chronic pain Initially psychiatry had deemed the patient to lack capacity. Per psychiatry note from 9/9, patient was found to competency by a judge on 08/08/2024. Psych meds per psychiatry.  Currently on Latuda , gabapentin , oxycodone  Follow-up with psychiatry as an outpatient   Hyperlipidemia Continue Crestor  5 mg PO at bedtime.   Diabetes mellitus type 2 last A1c of 5.8 from 01/02/2024 Not on meds.   Deconditioning: Ambulatory status complicated by previous right AKA and left BKA,  has no insurance coverage for SNF. Fitted for prosthesis.   H/o COPD Albuterol  inhaler PRN   H/o DVT Xarelto  20 mg PO daily    Goals of care   Code Status: Full Code   Diet:  Diet Order             Diet general           Diet regular Fluid consistency: Thin  Diet effective now           Diet - low sodium heart healthy                   Nutritional status:  Body mass index is 20.09 kg/m.       Wounds:  -    Discharge Exam:   Vitals:   08/15/24 2120 08/16/24 0816 08/16/24 2007 08/17/24 0735  BP:  128/70 ROLLEN)  142/84 121/85  Pulse: 74 67  64  Resp:  16 18 16   Temp: (!) 97.4 F (36.3 C) (!) 96.7 F (35.9 C)    TempSrc: Oral Oral    SpO2: 100% 100%  97%  Weight:      Height:        Body mass index is 20.09 kg/m.  General exam: Pleasant, calm, comfortable on wheelchair Skin: No rashes, lesions or ulcers. HEENT: Atraumatic, normocephalic, no obvious bleeding Lungs: Clear to auscultation bilaterally,  CVS: S1, S2,  no murmur,   GI/Abd: Soft, nontender, nondistended, bowel sound present,   CNS: Alert, awake, Psychiatry: irritated. Extremities: Right AKA, left BKA status  Follow ups:    Follow-up Information     Scot Ford, PA-C Follow up.   Why: **PLEASE DISREGARD [place] [address]DR[facility] LISTED BELOW, YOUR APPOINTMENT IS AT [address]**  You have a follow-up appointment with a provider at [facility] in [place] on Thursday Apr 07, 2024 at 8:50 AM. Lucrezia by 8:35 AM to check in.  We are currently in the process of transitioning from two locations to one.  Effective March 28, 2024, all appointments that were previously scheduled at either our [facility] or [facility] locations will be moved to our new location at [address], [place], [name], [address]. Contact information: [facility] [phone]                Discharge Instructions:   Discharge Instructions     (HEART FAILURE PATIENTS) Call MD:  Anytime you have any of the following symptoms: 1) 3 pound weight gain in 24 hours or 5 pounds in 1 week 2) shortness of breath, with or without a dry hacking cough 3) swelling in the hands, feet or stomach 4) if you have to sleep on extra pillows at night in order to breathe.   Complete by: As directed    Call MD for:  difficulty breathing, headache or visual disturbances   Complete by: As directed    Call MD for:  difficulty breathing, headache or visual disturbances   Complete by: As directed    Call MD for:  extreme fatigue   Complete by: As directed    Call MD for:  hives   Complete by: As directed    Call MD for:  persistant dizziness or light-headedness   Complete by: As directed    Call MD for:  persistant nausea and vomiting   Complete by: As directed    Call MD for:  persistant nausea and vomiting   Complete by: As directed    Call MD for:  severe uncontrolled pain   Complete by: As directed    Call MD for:  severe uncontrolled pain   Complete by: As  directed    Call MD for:  temperature >100.4   Complete by: As directed    Call MD for:  temperature >100.4   Complete by: As directed    Diet - low sodium heart healthy   Complete by: As directed    Diet general   Complete by: As directed    Discharge instructions   Complete by: As directed    Make sure to take you Eliquis  (blood thinner medicine) once in the a.m. and once in the p.m.  This is for the blood clot they found.  Not taking this medicine will put you at a very high risk of repeat stroke.  Make sure to follow-up with your cardiologist in the next 2  weeks.  Make sure to follow-up with your PCP later this week to ensure you are still doing well.  If you start having any trouble with weakness, trouble breathing, chest pain, confusion or dizziness come back to the emergency room immediately   Discharge instructions   Complete by: As directed    Recommendations at discharge:   Ensure compliance with psychiatric medicines, cardiac medicines.  Cautious use of pain medicines  Continue to follow-up with psychiatry as an outpatient   You have been started on a blood thinner.  Please watch out for any obvious bleeding, black stool or easy bruisability.  Please contact your primary care provider for further recommendation.    Discharge instructions for CHF Check weight daily -preferably same time every day. Restrict fluid intake to 1200 ml daily Restrict salt intake to less than 2 g daily. Call MD if you have one of the following symptoms 1) 3 pound weight gain in 24 hours or 5 pounds in 1 week  2) swelling in the hands, feet or stomach  3) progressive shortness of breath 4) if you have to sleep on extra pillows at night in order to breathe       PDMP reviewed this encounter.   Opioid taper instructions: It is important to wean off of your opioid medication as soon as possible. If you do not need pain medication after your surgery it is ok to stop day one. Opioids  include: Codeine, Hydrocodone(Norco, Vicodin), Oxycodone (Percocet, oxycontin ) and hydromorphone  amongst others.  Long term and even short term use of opiods can cause: Increased pain response Dependence Constipation Depression Respiratory depression And more.  Withdrawal symptoms can include Flu like symptoms Nausea, vomiting And more Techniques to manage these symptoms Hydrate well Eat regular healthy meals Stay active Use relaxation techniques(deep breathing, meditating, yoga) Do Not substitute Alcohol to help with tapering If you have been on opioids for less than two weeks and do not have pain than it is ok to stop all together.  Plan to wean off of opioids This plan should start within one week post op of your joint replacement. Maintain the same interval or time between taking each dose and first decrease the dose.  Cut the total daily intake of opioids by one tablet each day Next start to increase the time between doses. The last dose that should be eliminated is the evening dose.        General discharge instructions: Follow with Primary MD Tanda Bleacher, MD in 7 days  Please request your PCP  to go over your hospital tests, procedures, radiology results at the follow up. Please get your medicines reviewed and adjusted.  Your PCP may decide to repeat certain labs or tests as needed. Do not drive, operate heavy machinery, perform activities at heights, swimming or participation in water activities or provide baby sitting services if your were admitted for syncope or siezures until you have seen by Primary MD or a Neurologist and advised to do so again. [place]  Controlled Substance Reporting System database was reviewed. Do not drive, operate heavy machinery, perform activities at heights, swim, participate in water activities or provide baby-sitting services while on medications for pain, sleep and mood until your outpatient physician has reevaluated you and advised  to do so again.  You are strongly recommended to comply with the dose, frequency and duration of prescribed medications. Activity: As tolerated with Full fall precautions use walker/cane & assistance as needed Avoid using any recreational substances like cigarette,  tobacco, alcohol, or non-prescribed drug. If you experience worsening of your admission symptoms, develop shortness of breath, life threatening emergency, suicidal or homicidal thoughts you must seek medical attention immediately by calling 911 or calling your MD immediately  if symptoms less severe. You must read complete instructions/literature along with all the possible adverse reactions/side effects for all the medicines you take and that have been prescribed to you. Take any new medicine only after you have completely understood and accepted all the possible adverse reactions/side effects.  Wear Seat belts while driving. You were cared for by a hospitalist during your hospital stay. If you have any questions about your discharge medications or the care you received while you were in the hospital after you are discharged, you can call the unit and ask to speak with the hospitalist or the covering physician. Once you are discharged, your primary care physician will handle any further medical issues. Please note that NO REFILLS for any discharge medications will be authorized once you are discharged, as it is imperative that you return to your primary care physician (or establish a relationship with a primary care physician if you do not have one).   Increase activity slowly   Complete by: As directed    Increase activity slowly   Complete by: As directed        Discharge Medications:   Allergies as of 08/17/2024       Reactions   Acetaminophen  Hives, Nausea And Vomiting, Rash, Anaphylaxis   PT states that if he takes this he is going to the hospital   Motrin  [ibuprofen ] Anaphylaxis   Nsaids Anaphylaxis   PT states that if he  takes this he is going to the hospital   Lyrica  Cr [pregabalin  Er] Other (See Comments)   Dystonia Dysarthria    Peanut-containing Drug Products Other (See Comments)   Pt denies allergy to peanut butter 03/02/2021        Medication List     STOP taking these medications    ARIPiprazole  5 MG tablet Commonly known as: ABILIFY    budesonide  0.25 MG/2ML nebulizer solution Commonly known as: PULMICORT    carvedilol  12.5 MG tablet Commonly known as: COREG    carvedilol  25 MG tablet Commonly known as: COREG    isosorbide -hydrALAZINE  20-37.5 MG tablet Commonly known as: BIDIL    nicotine  21 mg/24hr patch Commonly known as: NICODERM CQ  - dosed in mg/24 hours   pantoprazole  40 MG tablet Commonly known as: PROTONIX    predniSONE  10 MG tablet Commonly known as: DELTASONE    tiotropium 18 MCG inhalation capsule Commonly known as: Spiriva  HandiHaler   torsemide  20 MG tablet Commonly known as: DEMADEX    Trelegy Ellipta  100-62.5-25 MCG/ACT Aepb Generic drug: Fluticasone -Umeclidin-Vilant       TAKE these medications    albuterol  108 (90 Base) MCG/ACT inhaler Commonly known as: Ventolin  HFA Inhale 2 puffs into the lungs every 4 (four) hours as needed for wheezing or shortness of breath. What changed: Another medication with the same name was removed. Continue taking this medication, and follow the directions you see here.   carvedilol  10 MG 24 hr capsule Commonly known as: COREG  CR Take 1 capsule (10 mg total) by mouth daily.   feeding supplement Liqd Take 237 mLs by mouth 2 (two) times daily between meals as needed (at patient request for hunger).   furosemide  40 MG tablet Commonly known as: LASIX  Take 1 tablet (40 mg total) by mouth daily.   gabapentin  100 MG capsule Commonly known as: NEURONTIN  Take  2 capsules (200 mg total) by mouth 2 (two) times daily. What changed:  medication strength how much to take when to take this   lisinopril  40 MG tablet Commonly known  as: ZESTRIL  Take 1 tablet (40 mg total) by mouth daily.   lurasidone  40 MG Tabs tablet Commonly known as: LATUDA  Take 1 tablet (40 mg total) by mouth daily with breakfast.   Oxycodone  HCl 10 MG Tabs Take 1 tablet (10 mg total) by mouth every 8 (eight) hours as needed for up to 5 days for severe pain (pain score 7-10). What changed:  medication strength how much to take reasons to take this   rivaroxaban  20 MG Tabs tablet Commonly known as: XARELTO  Take 1 tablet (20 mg total) by mouth at bedtime.   rosuvastatin  5 MG tablet Commonly known as: CRESTOR  Take 1 tablet (5 mg total) by mouth at bedtime.   spironolactone  25 MG tablet Commonly known as: ALDACTONE  Take 1 tablet (25 mg total) by mouth daily.         The results of significant diagnostics from this hospitalization (including imaging, microbiology, ancillary and laboratory) are listed below for reference.    Procedures and Diagnostic Studies:   MR BRAIN WO CONTRAST Result Date: 03/04/2024 CLINICAL DATA:  Stroke follow-up EXAM: MRI HEAD WITHOUT CONTRAST TECHNIQUE: Multiplanar, multiecho pulse sequences of the brain and surrounding structures were obtained without intravenous contrast. COMPARISON:  03/05/2019 and 01/29/2022 FINDINGS: Brain: Area of abnormal diffusion restriction affecting the left basal ganglia and insula with punctate foci that extend to the subcortical white matter of the left frontal and parietal lobes. Chronic microhemorrhage in the left centrum semiovale. There is multifocal hyperintense T2-weighted signal within the white matter. Parenchymal volume and CSF spaces are normal. The midline structures are normal. Multiple old cerebellar infarcts. Vascular: Normal flow voids. Skull and upper cervical spine: Normal calvarium and skull base. Visualized upper cervical spine and soft tissues are normal. Sinuses/Orbits:No paranasal sinus fluid levels or advanced mucosal thickening. No mastoid or middle ear effusion.  Normal orbits. IMPRESSION: 1. Acute infarct of the left basal ganglia and insula with punctate foci that extend to the subcortical white matter of the left frontal and parietal lobes. No hemorrhage or mass effect. 2. Multiple old cerebellar infarcts and findings of chronic small vessel ischemia. Electronically Signed   By: Franky Stanford M.D.   On: 03/04/2024 19:45   ECHOCARDIOGRAM LIMITED Result Date: 03/04/2024    ECHOCARDIOGRAM REPORT   Patient Name:   DEMETRIOUS RAINFORD Date of Exam: 03/04/2024 Medical Rec #:  [id number]      Height:       50.0 in Accession #:    [id number]     Weight:       169.8 lb Date of Birth:  1966/05/29      BSA:          1.525 m Patient Age:    57 years       BP:           171/102 mmHg Patient Gender: M              HR:           76 bpm. Exam Location:  Inpatient Procedure: Limited Echo and Color Doppler (Both Spectral and Color Flow Doppler            were utilized during procedure). Indications:    stroke  History:        Patient has prior history of Echocardiogram examinations,  most                 recent 01/14/2024.  Sonographer:    Tinnie Barefoot RDCS Referring Phys: [phone] DENISE A WOLFE IMPRESSIONS  1. The apex has prominen trabecualtions. There is a large mobile clot in the LV that appears to be attached to the interventricular septum just under the mitral valve. . Left ventricular ejection fraction, by estimation, is <20%. The left ventricle has severely decreased function. The left ventricle demonstrates global hypokinesis. Left ventricular diastolic function could not be evaluated.  2. There is a small RV apical clot. Right ventricular systolic function is severely reduced. The right ventricular size is normal.  3. There is a largr mobile clot in the LA. Left atrial size was mildly dilated.  4. Right atrial size was mildly dilated.  5. The mitral valve is normal in structure. Trivial mitral valve regurgitation. No evidence of mitral stenosis.  6. The aortic valve is normal in  structure. Aortic valve regurgitation is not visualized. No aortic stenosis is present. Conclusion(s)/Recommendation(s): Severe biventricular dysfunction with large mobile clots in the LA, RV and LV as described above. FINDINGS  Left Ventricle: The apex has prominen trabecualtions. There is a large mobile clot in the LV that appears to be attached to the interventricular septum just under the mitral valve. Left ventricular ejection fraction, by estimation, is <20%. The left ventricle has severely decreased function. The left ventricle demonstrates global hypokinesis. The left ventricular internal cavity size was normal in size. There is no left ventricular hypertrophy. Left ventricular diastolic function could not be evaluated. Right Ventricle: There is a small RV apical clot. The right ventricular size is normal. No increase in right ventricular wall thickness. Right ventricular systolic function is severely reduced. Left Atrium: There is a largr mobile clot in the LA. Left atrial size was mildly dilated. Right Atrium: Right atrial size was mildly dilated. Pericardium: There is no evidence of pericardial effusion. Mitral Valve: The mitral valve is normal in structure. Trivial mitral valve regurgitation. No evidence of mitral valve stenosis. Tricuspid Valve: The tricuspid valve is normal in structure. Tricuspid valve regurgitation is trivial. No evidence of tricuspid stenosis. Aortic Valve: The aortic valve is normal in structure. Aortic valve regurgitation is not visualized. No aortic stenosis is present. Pulmonic Valve: The pulmonic valve was not well visualized. Pulmonic valve regurgitation is not visualized. No evidence of pulmonic stenosis. Aorta: The aortic root is normal in size and structure. Venous: The inferior vena cava was not well visualized. IAS/Shunts: No atrial level shunt detected by color flow Doppler. Toribio Fuel MD Electronically signed by Toribio Fuel MD Signature Date/Time:  03/04/2024/4:59:08 PM    Final    CT ANGIO HEAD NECK W WO CM W PERF (CODE STROKE) Result Date: 03/04/2024 CLINICAL DATA:  Provided history: Neuro deficit, acute, stroke suspected. EXAM: CT ANGIOGRAPHY HEAD AND NECK CT PERFUSION BRAIN TECHNIQUE: Multidetector CT imaging of the head and neck was performed using the standard protocol during bolus administration of intravenous contrast. Multiplanar CT image reconstructions and MIPs were obtained to evaluate the vascular anatomy. Carotid stenosis measurements (when applicable) are obtained utilizing NASCET criteria, using the distal internal carotid diameter as the denominator. Multiphase CT imaging of the brain was performed following IV bolus contrast injection. Subsequent parametric perfusion maps were calculated using RAPID software. RADIATION DOSE REDUCTION: This exam was performed according to the departmental dose-optimization program which includes automated exposure control, adjustment of the mA and/or kV according to patient size  and/or use of iterative reconstruction technique. CONTRAST:  Administered contrast not known at this time. COMPARISON:  Non-contrast head CT performed earlier today 03/04/2024. CT angiogram head/neck 02/03/2022. FINDINGS: CTA NECK FINDINGS Aortic arch: Standard aortic branching. A sclerotic plaque within the visualized thoracic aorta and proximal major branch vessels of the neck. No hemodynamically significant innominate or proximal subclavian artery stenosis. Right carotid system: CCA and ICA patent within the neck. Bulky predominately calcified aspect plaque about the carotid bifurcation with severe (greater than 90%) short-segment stenosis at the ICA origin. Noncalcified atherosclerotic plaque elsewhere within the mid and distal CCA resulting in less than 50% stenosis. Partially retropharyngeal course of the cervical ICA. Left carotid system: CCA and ICA patent within the neck without hemodynamically significant stenosis (50% or  greater). Mild sclerotic plaque within the mid CCA. Moderate sclerotic plaque about the carotid bifurcation and within the proximal ICA. Vertebral arteries: Vertebral arteries patent within the neck. Progressive severe stenosis of the left vertebral artery proximal V1 segment. Skeleton: The patient is edentulous. Cervical spondylosis. No acute fracture or aggressive osseous lesion. Other neck: No neck mass or cervical lymphadenopathy. Upper chest: No consolidation within the imaged lung apices. Centrilobular emphysema. Review of the MIP images confirms the above findings CTA HEAD FINDINGS Anterior circulation: The intracranial internal carotid arteries are patent. Atherosclerotic plaque within both vessels with no more than mild stenosis. The M1 middle cerebral arteries are patent. A proximal M2 left middle cerebral artery vessel is occluded at its origin, new from the CTA 02/03/2022 (for instance as seen on series 11, image 25). No right M2 proximal branch occlusion or high-grade proximal stenosis is identified. The anterior cerebral arteries are patent. Developmentally hypoplastic or absent right A1 segment. No intracranial aneurysm is identified. Posterior circulation: The intracranial vertebral arteries are patent. Severe near occlusive stenoses of the left vertebral artery proximal to, at the level of, and distal to the PICA origin. These stenoses are new from the prior CTA of 02/03/2022. The basilar artery is patent. The posterior cerebral arteries are patent. Severe stenosis within a right PCA branch at the P3/P4 junction, new from the prior CTA. Posterior communicating arteries are hypoplastic or absent, bilaterally. Venous sinuses: Within the limitations of contrast timing, no convincing thrombus. Anatomic variants: As described. Review of the MIP images confirms the above findings CT Brain Perfusion Findings: CBF (<30%) Volume: 0mL Perfusion (Tmax>6.0s) volume: (predominately within the left MCA  vascular territory, although with some additional foci in the right cerebral watershed territory). Mismatch Volume: Infarction Location: None identified. However, an acute infarct was demonstrated within the left MCA territory on the non-contrast head CT performed earlier today. CTA head impression #1 called by telephone at the time of interpretation on 03/04/2024 at 3:26 pm to provider [facility] , who verbally acknowledged these results. IMPRESSION: CTA neck: 1. The common carotid and internal carotid arteries are patent with neck. Atherosclerotic plaque bilaterally, as described. Most notably, there is a severe (greater than 90%) stenosis at the right ICA origin which is similar to the prior CTA of 02/03/2022. 2. The vertebral arteries are patent in the neck. Progressive severe stenosis of the left vertebral artery proximal V1 segment. 3. Aortic Atherosclerosis (ICD10-I70.0) and Emphysema (ICD10-J43.9). CTA head: 1. Acute occlusion of a proximal M2 left middle cerebral artery vessel. 2. Background intracranial atherosclerotic disease with multifocal stenoses, most notably as follows. 3. 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 4. 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 CT perfusion head: The perfusion software identifies regions of hypoperfusion totaling 117 mL (utilizing the Tmax>6 seconds threshold). These foci are predominantly located within the left MCA vascular territory, although some foci are present within the right cerebral watershed territory. The perfusion software identifies no core infarct. However, an acute infarct was demonstrated on the non-contrast head CT performed earlier today, as described. The perfusion software reports a mismatch volume of 117 mL. The foci of hypoperfusion detected within the right cerebral watershed territory are  likely related to the high-grade stenosis of the proximal cervical right ICA. Electronically Signed   By: Rockey Childs D.O.   On: 03/04/2024 15:45   CT HEAD CODE STROKE WO CONTRAST Result Date: 03/04/2024 CLINICAL DATA:  Code stroke. Neuro deficit, acute, stroke suspected. EXAM: CT HEAD WITHOUT CONTRAST TECHNIQUE: Contiguous axial images were obtained from the base of the skull through the vertex without intravenous contrast. RADIATION DOSE REDUCTION: This exam was performed according to the departmental dose-optimization program which includes automated exposure control, adjustment of the mA and/or kV according to patient size and/or use of iterative reconstruction technique. COMPARISON:  Non-contrast head CT, CT angiogram head/neck and CT venogram head 02/03/2022. FINDINGS: Brain: Mild generalized parenchymal atrophy. Acute left MCA territory infarct affecting the left insula, left caudate nucleus, left lentiform nucleus and left internal capsule. Background moderate patchy and ill-defined hypoattenuation within cerebral white matter, nonspecific but compatible with chronic small vessel ischemic disease. 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 There is no acute intracranial hemorrhage. No extra-axial fluid collection. No evidence of an intracranial mass. No midline shift. Vascular: Asymmetric hyperdensity of the M1/proximal M2 left middle cerebral artery suspicious for the presence of thrombus. Atherosclerotic calcifications. Skull: No calvarial fracture or aggressive osseous lesion. Sinuses/Orbits: No mass or acute finding within the imaged orbits. Chronic medially displaced fracture deformity of the left lamina papyracea. No significant paranasal sinus disease at the imaged levels. ASPECTS (Alberta Stroke Program Early CT Score) - Ganglionic level infarction (caudate, lentiform nuclei, internal capsule, insula, M1-M3 cortex): 3 -  Supraganglionic infarction (M4-M6 cortex): 3 Total score (0-10 with 10 being normal): 6 Impressions #1 and #2 called by telephone at the time of interpretation on 03/04/2024 at 2:56 pm to provider Dr. Dimitri, who verbally acknowledged these results. IMPRESSION: 1. Acute left MCA territory infarct affecting the insula, caudate nucleus, lentiform nucleus and internal capsule. ASPECTS is 6. 2. Asymmetric hyperdensity of the M1/proximal M2 left middle cerebral artery suspicious for the presence of thrombus. A CTA of the head/neck is recommended for further evaluation. 3. Background parenchymal atrophy, chronic small vessel ischemic disease and chronic infarcts, as described. Electronically Signed   By: Rockey Childs D.O.   On: 03/04/2024 14:58     Labs:   Basic Metabolic Panel: No results for input(s): NA, K, CL, CO2, GLUCOSE, BUN, CREATININE, CALCIUM , MG, PHOS in the last 168 hours. GFR CrCl cannot be calculated (Patient's most recent lab result is older than the maximum 21 days allowed.). Liver Function Tests: No results for input(s): AST, ALT, ALKPHOS, BILITOT, PROT, ALBUMIN in the last 168 hours. No results for input(s): LIPASE, AMYLASE in the last 168 hours. No results for input(s): AMMONIA in the last 168 hours. Coagulation profile No results for input(s): INR, PROTIME in the last 168 hours.  CBC: No results for input(s): WBC, NEUTROABS, HGB, HCT,  MCV, PLT in the last 168 hours. Cardiac Enzymes: No results for input(s): CKTOTAL, CKMB, CKMBINDEX, TROPONINI in the last 168 hours. BNP: Invalid input(s): POCBNP CBG: No results for input(s): GLUCAP in the last 168 hours. D-Dimer No results for input(s): DDIMER in the last 72 hours. Hgb A1c No results for input(s): HGBA1C in the last 72 hours. Lipid Profile No results for input(s): CHOL, HDL, LDLCALC, TRIG, CHOLHDL, LDLDIRECT in the last 72 hours. Thyroid   function studies No results for input(s): TSH, T4TOTAL, T3FREE, THYROIDAB in the last 72 hours.  Invalid input(s): FREET3 Anemia work up No results for input(s): VITAMINB12, FOLATE, FERRITIN, TIBC, IRON, RETICCTPCT in the last 72 hours. Microbiology No results found for this or any previous visit (from the past 240 hours).  Time coordinating discharge: 45 minutes  Signed: Shaka Zech  [facility] 08/17/2024, 10:11 AM

## 2024-08-17 NOTE — TOC Transition Note (Incomplete)
 Transition of Care [facility]) - Discharge Note   Patient Details  Name: [name] MRN: [id number] Date of Birth: 28-May-1966  [facility] CM/SW Contact:  [name], [name] Phone Number: 08/17/2024, 12:40 PM   Clinical Narrative:       Final next level of care: Home/Self Care Barriers to Discharge: Barriers Resolved   Patient Goals and CMS Choice            Discharge Placement                       Discharge Plan and Services Additional resources added to the After Visit Summary for                                       Social Drivers of Health (SDOH) Interventions SDOH Screenings   Food Insecurity: Food Insecurity Present (03/06/2024)  Housing: High Risk (03/06/2024)  Transportation Needs: No Transportation Needs (03/06/2024)  Utilities: Patient Declined (03/06/2024)  Alcohol Screen: Medium Risk (06/24/2019)  Depression (PHQ2-9): Low Risk  (02/18/2024)  Recent Concern: Depression (PHQ2-9) - Medium Risk (01/21/2024)  Financial Resource Strain: High Risk (07/24/2021)   Received from [facility]  Physical Activity: Inactive (02/18/2024)  Social Connections: Unknown (04/05/2022)   Received from [facility]  Stress: Stress Concern Present (07/24/2021)   Received from [facility]  Tobacco Use: High Risk (03/04/2024)  Health Literacy: Adequate Health Literacy (02/18/2024)     Readmission Risk Interventions    12/21/2023    9:25 AM 12/09/2022    9:02 AM 12/01/2022    3:56 PM  Readmission Risk Prevention Plan  Transportation Screening Complete Complete Complete  HRI or Home Care Consult Complete    Social Work Consult for Recovery Care Planning/Counseling Complete    Palliative Care Screening Not Applicable    Medication Review Oceanographer) Complete Complete Complete  PCP or Specialist appointment within 3-5 days of discharge  Complete Complete  HRI or Home Care Consult  Complete Complete  SW Recovery Care/Counseling Consult   Complete Complete  Palliative Care Screening  Not Applicable Not Applicable  Skilled Nursing Facility  Not Applicable Not Applicable  SNF Comments   n/a

## 2024-08-17 NOTE — Progress Notes (Signed)
 Pt refused to allow RN to discuss any discharge information with him.  Packet and medications given to pt and person driving him to boarding house. He has all belongings with him.No questions from patient

## 2024-08-18 ENCOUNTER — Telehealth: Payer: Self-pay | Admitting: *Deleted

## 2024-08-18 NOTE — Transitions of Care (Post Inpatient/ED Visit) (Signed)
   08/18/2024  Name: [name] MRN: [id number] DOB: Dec 31, 1965  Today's TOC FU Call Status: Today's TOC FU Call Status:: Unsuccessful Call (1st Attempt) Unsuccessful Call (1st Attempt) Date: 08/18/24  Attempted to reach the patient regarding the most recent Inpatient/ED visit.  Follow Up Plan: Additional outreach attempts will be made to reach the patient to complete the Transitions of Care (Post Inpatient/ED visit) call.   Andrea Dimes RN, BSN [place]  [facility] Health RN Care Manager [phone]

## 2024-08-19 ENCOUNTER — Telehealth: Payer: Self-pay | Admitting: *Deleted

## 2024-08-19 NOTE — Transitions of Care (Post Inpatient/ED Visit) (Signed)
   08/19/2024  Name: [name] MRN: [id number] DOB: 07/28/1966  Today's TOC FU Call Status: Today's TOC FU Call Status:: Unsuccessful Call (2nd Attempt) Unsuccessful Call (2nd Attempt) Date: 08/19/24  Attempted to reach the patient regarding the most recent Inpatient/ED visit.  Follow Up Plan: Additional outreach attempts will be made to reach the patient to complete the Transitions of Care (Post Inpatient/ED visit) call.   Andrea Dimes RN, BSN [place]  [facility] Health RN Care Manager [phone]

## 2024-08-22 ENCOUNTER — Telehealth: Payer: Self-pay | Admitting: *Deleted

## 2024-08-22 NOTE — Transitions of Care (Post Inpatient/ED Visit) (Signed)
   08/22/2024  Name: [name] MRN: [id number] DOB: Apr 14, 1966  Today's TOC FU Call Status: Today's TOC FU Call Status:: Unsuccessful Call (3rd Attempt) Unsuccessful Call (3rd Attempt) Date: 08/22/24  Attempted to reach the patient regarding the most recent Inpatient/ED visit.  Follow Up Plan: No further outreach attempts will be made at this time. We have been unable to contact the patient.  Andrea Dimes RN, BSN [place]  [facility] Health RN Care Manager [phone]

## 2024-10-03 ENCOUNTER — Encounter: Payer: Self-pay | Admitting: Radiology

## 2024-10-10 ENCOUNTER — Telehealth: Payer: Self-pay

## 2024-10-10 NOTE — Transitions of Care (Post Inpatient/ED Visit) (Unsigned)
   10/10/2024  Name: [name] MRN: [id number] DOB: 04/11/1966  Today's TOC FU Call Status: Today's TOC FU Call Status:: Unsuccessful Call (1st Attempt) Unsuccessful Call (1st Attempt) Date: 10/10/24  Attempted to reach the patient regarding the most recent Inpatient/ED visit.  Follow Up Plan: Additional outreach attempts will be made to reach the patient to complete the Transitions of Care (Post Inpatient/ED visit) call.   Signature Julian Lemmings, LPN [facility] Nurse Health Advisor Direct Dial [phone]

## 2024-10-11 NOTE — Transitions of Care (Post Inpatient/ED Visit) (Unsigned)
   10/11/2024  Name: [name] MRN: [id number] DOB: 03-Aug-1966  Today's TOC FU Call Status: Today's TOC FU Call Status:: Unsuccessful Call (2nd Attempt) Unsuccessful Call (1st Attempt) Date: 10/10/24 Unsuccessful Call (2nd Attempt) Date: 10/11/24  Attempted to reach the patient regarding the most recent Inpatient/ED visit.  Follow Up Plan: Additional outreach attempts will be made to reach the patient to complete the Transitions of Care (Post Inpatient/ED visit) call.   Signature Julian Lemmings, LPN [facility] Nurse Health Advisor Direct Dial [phone]

## 2024-10-12 NOTE — Transitions of Care (Post Inpatient/ED Visit) (Signed)
   10/12/2024  Name: [name] MRN: [id number] DOB: 1966-10-19  Today's TOC FU Call Status: Today's TOC FU Call Status:: Unsuccessful Call (3rd Attempt) Unsuccessful Call (1st Attempt) Date: 10/10/24 Unsuccessful Call (2nd Attempt) Date: 10/11/24 Unsuccessful Call (3rd Attempt) Date: 10/12/24  Attempted to reach the patient regarding the most recent Inpatient/ED visit.  Follow Up Plan: No further outreach attempts will be made at this time. We have been unable to contact the patient.  Signature Julian Lemmings, LPN [facility] Nurse Health Advisor Direct Dial [phone]

## 2024-10-22 ENCOUNTER — Other Ambulatory Visit (HOSPITAL_COMMUNITY): Payer: Self-pay

## 2024-12-05 ENCOUNTER — Telehealth: Payer: Self-pay

## 2024-12-05 NOTE — Transitions of Care (Post Inpatient/ED Visit) (Signed)
" ° °  12/05/2024  Name: [name] MRN: [id number] DOB: [birth year]  Today's TOC FU Call Status: Today's TOC FU Call Status:: Unsuccessful Call (1st Attempt) Unsuccessful Call (1st Attempt) Date: 12/05/24  Attempted to reach the patient regarding the most recent Inpatient/ED visit.  Follow Up Plan: Additional outreach attempts will be made to reach the patient to complete the Transitions of Care (Post Inpatient/ED visit) call.   Signature Julian Lemmings, LPN [facility] Nurse Health Advisor Direct Dial [phone]  "

## 2024-12-06 NOTE — Transitions of Care (Post Inpatient/ED Visit) (Signed)
" ° °  12/06/2024  Name: [name] MRN: [id number] DOB: Jun 24, 1966  Today's TOC FU Call Status: Today's TOC FU Call Status:: Unsuccessful Call (2nd Attempt) Unsuccessful Call (1st Attempt) Date: 12/05/24 Unsuccessful Call (2nd Attempt) Date: 12/06/24  Attempted to reach the patient regarding the most recent Inpatient/ED visit.  Follow Up Plan: Additional outreach attempts will be made to reach the patient to complete the Transitions of Care (Post Inpatient/ED visit) call.   Signature Julian Lemmings, LPN [facility] Nurse Health Advisor Direct Dial [phone]  "

## 2024-12-06 NOTE — Transitions of Care (Post Inpatient/ED Visit) (Signed)
" ° °  12/06/2024  Name: [name] MRN: [id number] DOB: May 12, 1966  Today's TOC FU Call Status: Today's TOC FU Call Status:: Unsuccessful Call (3rd Attempt) Unsuccessful Call (1st Attempt) Date: 12/05/24 Unsuccessful Call (2nd Attempt) Date: 12/06/24 Unsuccessful Call (3rd Attempt) Date: 12/07/23  Attempted to reach the patient regarding the most recent Inpatient/ED visit.  Follow Up Plan: No further outreach attempts will be made at this time. We have been unable to contact the patient.  Signature Nikki M,CMA "
